# Patient Record
Sex: Male | Born: 1960 | Race: Black or African American | Hispanic: No | Marital: Single | Smoking: Former smoker
Health system: Southern US, Community
[De-identification: ages and names within clinical notes are randomized; demographics above are authoritative.]

## PROBLEM LIST (undated history)

## (undated) DIAGNOSIS — G4730 Sleep apnea, unspecified: Secondary | ICD-10-CM

## (undated) DIAGNOSIS — C649 Malignant neoplasm of unspecified kidney, except renal pelvis: Secondary | ICD-10-CM

## (undated) DIAGNOSIS — I639 Cerebral infarction, unspecified: Secondary | ICD-10-CM

## (undated) DIAGNOSIS — K922 Gastrointestinal hemorrhage, unspecified: Secondary | ICD-10-CM

## (undated) DIAGNOSIS — Z789 Other specified health status: Secondary | ICD-10-CM

## (undated) DIAGNOSIS — I358 Other nonrheumatic aortic valve disorders: Secondary | ICD-10-CM

## (undated) DIAGNOSIS — R51 Headache: Secondary | ICD-10-CM

## (undated) DIAGNOSIS — E215 Disorder of parathyroid gland, unspecified: Secondary | ICD-10-CM

## (undated) DIAGNOSIS — N2581 Secondary hyperparathyroidism of renal origin: Secondary | ICD-10-CM

## (undated) DIAGNOSIS — K219 Gastro-esophageal reflux disease without esophagitis: Secondary | ICD-10-CM

## (undated) DIAGNOSIS — K5792 Diverticulitis of intestine, part unspecified, without perforation or abscess without bleeding: Secondary | ICD-10-CM

## (undated) DIAGNOSIS — R569 Unspecified convulsions: Secondary | ICD-10-CM

## (undated) DIAGNOSIS — N186 End stage renal disease: Secondary | ICD-10-CM

## (undated) DIAGNOSIS — F32A Depression, unspecified: Secondary | ICD-10-CM

## (undated) DIAGNOSIS — Z992 Dependence on renal dialysis: Secondary | ICD-10-CM

## (undated) DIAGNOSIS — K250 Acute gastric ulcer with hemorrhage: Secondary | ICD-10-CM

## (undated) DIAGNOSIS — F419 Anxiety disorder, unspecified: Secondary | ICD-10-CM

## (undated) DIAGNOSIS — R7303 Prediabetes: Secondary | ICD-10-CM

## (undated) DIAGNOSIS — R519 Headache, unspecified: Secondary | ICD-10-CM

## (undated) DIAGNOSIS — D649 Anemia, unspecified: Secondary | ICD-10-CM

## (undated) DIAGNOSIS — I309 Acute pericarditis, unspecified: Secondary | ICD-10-CM

## (undated) DIAGNOSIS — I10 Essential (primary) hypertension: Secondary | ICD-10-CM

## (undated) DIAGNOSIS — M1990 Unspecified osteoarthritis, unspecified site: Secondary | ICD-10-CM

## (undated) DIAGNOSIS — H269 Unspecified cataract: Secondary | ICD-10-CM

## (undated) DIAGNOSIS — G8929 Other chronic pain: Secondary | ICD-10-CM

## (undated) HISTORY — DX: Anxiety disorder, unspecified: F41.9

## (undated) HISTORY — PX: OTHER SURGICAL HISTORY: SHX169

## (undated) HISTORY — DX: Gastro-esophageal reflux disease without esophagitis: K21.9

## (undated) HISTORY — DX: Depression, unspecified: F32.A

## (undated) HISTORY — DX: Anemia, unspecified: D64.9

## (undated) HISTORY — DX: Essential (primary) hypertension: I10

## (undated) HISTORY — DX: Headache, unspecified: R51.9

## (undated) HISTORY — PX: INSERTION OF DIALYSIS CATHETER: SHX1324

## (undated) HISTORY — DX: Unspecified cataract: H26.9

## (undated) HISTORY — DX: Headache: R51

## (undated) HISTORY — PX: NEPHRECTOMY: SHX65

## (undated) HISTORY — PX: AV FISTULA PLACEMENT: SHX1204

## (undated) HISTORY — DX: Cerebral infarction, unspecified: I63.9

## (undated) HISTORY — DX: Other chronic pain: G89.29

## (undated) HISTORY — DX: Acute pericarditis, unspecified: I30.9

## (undated) HISTORY — DX: Acute gastric ulcer with hemorrhage: K25.0

## (undated) HISTORY — DX: Sleep apnea, unspecified: G47.30

## (undated) HISTORY — PX: CATARACT EXTRACTION W/ INTRAOCULAR LENS  IMPLANT, BILATERAL: SHX1307

## (undated) HISTORY — PX: COLON SURGERY: SHX602

## (undated) SURGERY — EGD (ESOPHAGOGASTRODUODENOSCOPY)
Anesthesia: Moderate Sedation

---

## 2003-07-06 ENCOUNTER — Emergency Department (HOSPITAL_COMMUNITY): Admission: EM | Admit: 2003-07-06 | Discharge: 2003-07-07 | Payer: Self-pay | Admitting: Emergency Medicine

## 2006-06-27 ENCOUNTER — Ambulatory Visit: Payer: Self-pay | Admitting: Family Medicine

## 2006-07-04 ENCOUNTER — Ambulatory Visit: Payer: Self-pay | Admitting: Family Medicine

## 2006-07-11 ENCOUNTER — Encounter: Payer: Self-pay | Admitting: Internal Medicine

## 2006-08-02 ENCOUNTER — Inpatient Hospital Stay (HOSPITAL_COMMUNITY): Admission: EM | Admit: 2006-08-02 | Discharge: 2006-08-27 | Payer: Self-pay | Admitting: Emergency Medicine

## 2006-08-02 ENCOUNTER — Emergency Department: Payer: Self-pay | Admitting: Emergency Medicine

## 2006-08-02 DIAGNOSIS — I12 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 stage renal disease: Secondary | ICD-10-CM | POA: Insufficient documentation

## 2006-08-06 ENCOUNTER — Encounter (INDEPENDENT_AMBULATORY_CARE_PROVIDER_SITE_OTHER): Payer: Self-pay | Admitting: Emergency Medicine

## 2006-08-09 ENCOUNTER — Ambulatory Visit: Payer: Self-pay | Admitting: Vascular Surgery

## 2006-08-14 ENCOUNTER — Ambulatory Visit: Payer: Self-pay | Admitting: *Deleted

## 2006-08-14 ENCOUNTER — Encounter (INDEPENDENT_AMBULATORY_CARE_PROVIDER_SITE_OTHER): Payer: Self-pay | Admitting: Nephrology

## 2006-08-29 DIAGNOSIS — D689 Coagulation defect, unspecified: Secondary | ICD-10-CM | POA: Insufficient documentation

## 2006-08-29 DIAGNOSIS — E1151 Type 2 diabetes mellitus with diabetic peripheral angiopathy without gangrene: Secondary | ICD-10-CM | POA: Insufficient documentation

## 2006-09-30 ENCOUNTER — Ambulatory Visit: Payer: Self-pay | Admitting: Vascular Surgery

## 2006-09-30 ENCOUNTER — Ambulatory Visit (HOSPITAL_COMMUNITY): Admission: RE | Admit: 2006-09-30 | Discharge: 2006-09-30 | Payer: Self-pay | Admitting: Vascular Surgery

## 2006-11-06 ENCOUNTER — Ambulatory Visit: Payer: Self-pay | Admitting: Surgery

## 2006-11-06 ENCOUNTER — Ambulatory Visit (HOSPITAL_COMMUNITY): Admission: RE | Admit: 2006-11-06 | Discharge: 2006-11-06 | Payer: Self-pay | Admitting: Vascular Surgery

## 2006-11-07 ENCOUNTER — Emergency Department (HOSPITAL_COMMUNITY): Admission: EM | Admit: 2006-11-07 | Discharge: 2006-11-07 | Payer: Self-pay | Admitting: Emergency Medicine

## 2006-11-14 ENCOUNTER — Ambulatory Visit (HOSPITAL_COMMUNITY): Admission: RE | Admit: 2006-11-14 | Discharge: 2006-11-14 | Payer: Self-pay | Admitting: Vascular Surgery

## 2006-11-15 ENCOUNTER — Ambulatory Visit (HOSPITAL_COMMUNITY): Admission: RE | Admit: 2006-11-15 | Discharge: 2006-11-15 | Payer: Self-pay | Admitting: Vascular Surgery

## 2006-12-27 ENCOUNTER — Ambulatory Visit: Payer: Self-pay | Admitting: Surgery

## 2006-12-28 ENCOUNTER — Ambulatory Visit (HOSPITAL_COMMUNITY): Admission: RE | Admit: 2006-12-28 | Discharge: 2006-12-28 | Payer: Self-pay | Admitting: Surgery

## 2007-01-01 ENCOUNTER — Ambulatory Visit (HOSPITAL_COMMUNITY): Admission: EM | Admit: 2007-01-01 | Discharge: 2007-01-01 | Payer: Self-pay | Admitting: Emergency Medicine

## 2007-01-21 ENCOUNTER — Ambulatory Visit: Payer: Self-pay | Admitting: Vascular Surgery

## 2007-03-28 ENCOUNTER — Inpatient Hospital Stay (HOSPITAL_COMMUNITY): Admission: RE | Admit: 2007-03-28 | Discharge: 2007-03-29 | Payer: Self-pay | Admitting: Nephrology

## 2007-03-29 ENCOUNTER — Ambulatory Visit: Payer: Self-pay | Admitting: Vascular Surgery

## 2007-04-22 ENCOUNTER — Ambulatory Visit: Payer: Self-pay | Admitting: Vascular Surgery

## 2007-04-23 ENCOUNTER — Ambulatory Visit (HOSPITAL_COMMUNITY): Admission: RE | Admit: 2007-04-23 | Discharge: 2007-04-23 | Payer: Self-pay | Admitting: Vascular Surgery

## 2007-05-02 ENCOUNTER — Ambulatory Visit (HOSPITAL_COMMUNITY): Admission: RE | Admit: 2007-05-02 | Discharge: 2007-05-02 | Payer: Self-pay | Admitting: Vascular Surgery

## 2007-06-11 ENCOUNTER — Ambulatory Visit: Payer: Self-pay | Admitting: Vascular Surgery

## 2007-06-28 ENCOUNTER — Ambulatory Visit (HOSPITAL_COMMUNITY): Admission: RE | Admit: 2007-06-28 | Discharge: 2007-06-28 | Payer: Self-pay | Admitting: Interventional Radiology

## 2007-06-30 ENCOUNTER — Ambulatory Visit: Payer: Self-pay | Admitting: *Deleted

## 2007-06-30 ENCOUNTER — Ambulatory Visit (HOSPITAL_COMMUNITY): Admission: RE | Admit: 2007-06-30 | Discharge: 2007-06-30 | Payer: Self-pay | Admitting: Vascular Surgery

## 2007-08-06 ENCOUNTER — Ambulatory Visit: Payer: Self-pay | Admitting: Vascular Surgery

## 2007-12-03 ENCOUNTER — Ambulatory Visit: Payer: Self-pay | Admitting: Cardiology

## 2007-12-03 ENCOUNTER — Ambulatory Visit (HOSPITAL_COMMUNITY): Admission: RE | Admit: 2007-12-03 | Discharge: 2007-12-03 | Payer: Self-pay | Admitting: Nephrology

## 2007-12-03 ENCOUNTER — Encounter (INDEPENDENT_AMBULATORY_CARE_PROVIDER_SITE_OTHER): Payer: Self-pay | Admitting: Nephrology

## 2007-12-19 ENCOUNTER — Ambulatory Visit: Payer: Self-pay

## 2009-01-28 ENCOUNTER — Ambulatory Visit: Payer: Self-pay | Admitting: Vascular Surgery

## 2009-02-07 ENCOUNTER — Ambulatory Visit: Payer: Self-pay | Admitting: Vascular Surgery

## 2009-02-08 ENCOUNTER — Ambulatory Visit: Payer: Self-pay | Admitting: Vascular Surgery

## 2009-02-08 ENCOUNTER — Ambulatory Visit (HOSPITAL_COMMUNITY): Admission: RE | Admit: 2009-02-08 | Discharge: 2009-02-08 | Payer: Self-pay | Admitting: Vascular Surgery

## 2009-03-21 ENCOUNTER — Ambulatory Visit: Payer: Self-pay | Admitting: Surgery

## 2009-03-25 ENCOUNTER — Inpatient Hospital Stay (HOSPITAL_COMMUNITY): Admission: AD | Admit: 2009-03-25 | Discharge: 2009-03-27 | Payer: Self-pay | Admitting: Surgery

## 2009-03-25 ENCOUNTER — Ambulatory Visit: Payer: Self-pay | Admitting: Surgery

## 2009-03-28 ENCOUNTER — Emergency Department (HOSPITAL_COMMUNITY): Admission: EM | Admit: 2009-03-28 | Discharge: 2009-03-28 | Payer: Self-pay | Admitting: Family Medicine

## 2009-04-11 ENCOUNTER — Ambulatory Visit: Payer: Self-pay | Admitting: Surgery

## 2009-07-29 DIAGNOSIS — E440 Moderate protein-calorie malnutrition: Secondary | ICD-10-CM | POA: Insufficient documentation

## 2009-11-09 ENCOUNTER — Ambulatory Visit (HOSPITAL_COMMUNITY): Admission: RE | Admit: 2009-11-09 | Discharge: 2009-11-09 | Payer: Self-pay | Admitting: Nephrology

## 2009-12-05 ENCOUNTER — Ambulatory Visit: Payer: Self-pay | Admitting: Surgery

## 2010-05-03 LAB — BASIC METABOLIC PANEL
BUN: 57 mg/dL — ABNORMAL HIGH (ref 6–23)
CO2: 26 mEq/L (ref 19–32)
Calcium: 9.9 mg/dL (ref 8.4–10.5)
Chloride: 101 mEq/L (ref 96–112)
Creatinine, Ser: 9.81 mg/dL — ABNORMAL HIGH (ref 0.4–1.5)
GFR calc Af Amer: 7 mL/min — ABNORMAL LOW (ref >60)
GFR calc non Af Amer: 6 mL/min — ABNORMAL LOW (ref >60)
Glucose, Bld: 99 mg/dL (ref 70–99)
Potassium: 5.5 mEq/L — ABNORMAL HIGH (ref 3.5–5.1)
Sodium: 137 mEq/L (ref 135–145)

## 2010-05-03 LAB — POCT I-STAT 4, (NA,K, GLUC, HGB,HCT)
Glucose, Bld: 95 mg/dL (ref 70–99)
HCT: 38 % — ABNORMAL LOW (ref 39.0–52.0)
Hemoglobin: 12.9 g/dL — ABNORMAL LOW (ref 13.0–17.0)
Potassium: 4.3 mEq/L (ref 3.5–5.1)
Sodium: 136 mEq/L (ref 135–145)

## 2010-05-03 LAB — ANAEROBIC CULTURE

## 2010-05-03 LAB — CBC
HCT: 31.5 % — ABNORMAL LOW (ref 39.0–52.0)
Hemoglobin: 10.8 g/dL — ABNORMAL LOW (ref 13.0–17.0)
MCHC: 34.3 g/dL (ref 30.0–36.0)
MCV: 92.9 fL (ref 78.0–100.0)
Platelets: 203 10*3/uL (ref 150–400)
RBC: 3.39 MIL/uL — ABNORMAL LOW (ref 4.22–5.81)
RDW: 14.9 % (ref 11.5–15.5)
WBC: 8.1 10*3/uL (ref 4.0–10.5)

## 2010-05-03 LAB — WOUND CULTURE: Culture: NO GROWTH

## 2010-05-15 LAB — POCT I-STAT 4, (NA,K, GLUC, HGB,HCT)
Glucose, Bld: 83 mg/dL (ref 70–99)
HCT: 38 % — ABNORMAL LOW (ref 39.0–52.0)
Hemoglobin: 12.9 g/dL — ABNORMAL LOW (ref 13.0–17.0)
Potassium: 5.9 mEq/L — ABNORMAL HIGH (ref 3.5–5.1)
Sodium: 138 mEq/L (ref 135–145)

## 2010-05-15 LAB — WOUND CULTURE: Culture: NO GROWTH

## 2010-06-13 ENCOUNTER — Ambulatory Visit (HOSPITAL_COMMUNITY): Payer: Medicare Other | Attending: Nephrology | Admitting: Radiology

## 2010-06-13 VITALS — Ht 73.0 in | Wt 282.0 lb

## 2010-06-13 DIAGNOSIS — Z01810 Encounter for preprocedural cardiovascular examination: Secondary | ICD-10-CM

## 2010-06-13 DIAGNOSIS — R0989 Other specified symptoms and signs involving the circulatory and respiratory systems: Secondary | ICD-10-CM

## 2010-06-13 DIAGNOSIS — R0609 Other forms of dyspnea: Secondary | ICD-10-CM

## 2010-06-13 MED ORDER — TECHNETIUM TC 99M TETROFOSMIN IV KIT
33.0000 | PACK | Freq: Once | INTRAVENOUS | Status: AC | PRN
  Administered 2010-06-13: 33 via INTRAVENOUS

## 2010-06-13 NOTE — Progress Notes (Signed)
[facility] [facility] NUCLEAR MED [place] [address] [phone]  Cardiology Nuclear Med Study  [name] is a 50 y.o. male [id number] 1961/01/29   Nuclear Med Background Indication for Stress Test:  Evaluation for Ischemia, Surgical Clearance and Pending Kidney Transplant History:  Echo and Myocardial Perfusion Study Cardiac Risk Factors: Family History - CAD, History of Smoking and Hypertension  Symptoms:  Palpitations and Rapid HR   Nuclear Pre-Procedure Caffeine/Decaff Intake:  None NPO After: 2:00am   Lungs:  clear IV 0.9% NS with Angio Cath:  20g  IV Site: L Hand  IV Started by:  Perrin Maltese, EMT-P  Chest Size (in):  52 Cup Size: n/a  Height: 6\' 1"  (1.854 m)  Weight:  282 lb (127.914 kg)  BMI:  Body mass index is 37.21 kg/(m^2). Tech Comments:  n/a    Nuclear Med Study 1 or 2 day study: 2 day  Stress Test Type:  Stress  Reading MD: Glori Bickers, MD  Order Authorizing Provider:  C.Dunham  Resting Radionuclide: Technetium 37m Tetrofosmin  Resting Radionuclide Dose: 33 mCi   Stress Radionuclide:  Technetium 70m Tetrofosmin  Stress Radionuclide Dose: 33 mCi           Stress Protocol Rest HR: 103 Stress HR: 179  Rest BP: 127/62 Stress BP: 179/102  Exercise Time (min): 7:30 METS: 9.2   Predicted Max HR: 169 bpm % Max HR: 106.51 bpm Rate Pressure Product: 32041   Dose of Adenosine (mg):  n/a Dose of Lexiscan: n/a mg  Dose of Atropine (mg): n/a Dose of Dobutamine: n/a mcg/kg/min (at max HR)  Stress Test Technologist: Perrin Maltese, EMT-P  Nuclear Technologist:  Charlton Amor, CNMT     Rest Procedure:  Myocardial perfusion imaging was performed at rest 45 minutes following the intravenous administration of Technetium 61m Tetrofosmin. Rest ECG: NSR  Stress Procedure:  The patient exercised for 7:30.  The patient stopped due to fatigue and denied any chest pain.  There were no significant ST-T wave changes.   Technetium 63m Tetrofosmin was injected at peak exercise and myocardial perfusion imaging was performed after a brief delay. Stress ECG: Insignificant upsloping ST segment depression.  QPS Raw Data Images:  Normal; no motion artifact; normal heart/lung ratio. Stress Images:  There is decreased uptake in the inferior wall. Rest Images:  There is decreased uptake in the inferior wall. Subtraction (SDS):  There is a fixed inferior defect that is most consistent with diaphragmatic attenuation. Transient Ischemic Dilatation (Normal <1.22):  0.84 Lung/Heart Ratio (Normal <0.45):  0.32  Quantitative Gated Spect Images QGS EDV:  132 ml QGS ESV:  60 ml QGS cine images:  NL LV Function; NL Wall Motion QGS EF: 54%  Impression Exercise Capacity:  Fair exercise capacity. BP Response:  Normal blood pressure response. Clinical Symptoms:  No chest pain. ECG Impression:  Insignificant upsloping ST segment depression. Comparison with Prior Nuclear Study: No images to compare  Overall Impression:  Normal stress nuclear study. There is a mild fixed inferior defect consistent with diaphragmatic attenuation.     [name]

## 2010-06-19 ENCOUNTER — Ambulatory Visit (AMBULATORY_SURGERY_CENTER): Payer: Medicare Other | Admitting: *Deleted

## 2010-06-19 VITALS — Ht 74.0 in | Wt 298.7 lb

## 2010-06-19 DIAGNOSIS — Z1211 Encounter for screening for malignant neoplasm of colon: Secondary | ICD-10-CM

## 2010-06-19 MED ORDER — PEG-KCL-NACL-NASULF-NA ASC-C 100 G PO SOLR: ORAL | Status: DC

## 2010-06-20 ENCOUNTER — Encounter (HOSPITAL_COMMUNITY): Payer: Medicare Other | Admitting: Radiology

## 2010-06-22 ENCOUNTER — Other Ambulatory Visit: Payer: Self-pay | Admitting: Gastroenterology

## 2010-06-27 ENCOUNTER — Ambulatory Visit (HOSPITAL_COMMUNITY): Payer: Medicare Other | Attending: Nephrology | Admitting: Radiology

## 2010-06-27 DIAGNOSIS — R0989 Other specified symptoms and signs involving the circulatory and respiratory systems: Secondary | ICD-10-CM

## 2010-06-27 MED ORDER — TECHNETIUM TC 99M TETROFOSMIN IV KIT
33.0000 | PACK | Freq: Once | INTRAVENOUS | Status: AC | PRN
  Administered 2010-06-27: 33 via INTRAVENOUS

## 2010-06-27 NOTE — Assessment & Plan Note (Signed)
OFFICE VISIT   HAGEN, MAKAREWICZ  DOB:  04/03/1960                                       03/21/2009  CHART#:[id number]   REASON FOR VISIT:  Dialysis graft swelling.   HISTORY:  This is a 50 year old gentleman who was seen by Dr. Donnetta Hutching in  December and found to have swelling over his graft.  This is in his left  forearm.  Ultrasound showed significant perigraft fluid.  There was  concern over infection, so the patient was taken to the operating room  for graft removal.  This was done on 02/08/2009.  Vein patch angioplasty  was performed.  The patient comes back today with a several-day history  of swelling over the upper arm graft.  He has not had any fevers or  drainage or chills, but he is concerned that this is very similar to the  way the initial presented.   PHYSICAL EXAMINATION:  His heart rate is 98.  Blood pressure 139/97,  temperature is 98.4.  general:  He is well-appearing in no distress.  HEENT within normal limits.  Cardiovascular:  Regular rate and rhythm.  Extremities:  There are 2 fluid-like areas over his left upper arm  graft.  His forearm removal sites are well-healed.   The patient had an ultrasound today which showed significant perigraft  fluid in the left upper arm.   PLAN:  Infected left upper arm graft.  The patient will be scheduled for  removal of his left upper arm graft and patch angioplasty of the artery  if required.  This will be scheduled for Friday, February 11th.     Eldridge Abrahams, MD  Electronically Signed   VWB/MEDQ  D:  03/21/2009  T:  03/22/2009  Job:  [id number]

## 2010-06-27 NOTE — Op Note (Signed)
[name], [name]             ACCOUNT NO.:  [id number]   MEDICAL RECORD NO.:  [id number]          PATIENT TYPE:  INP   LOCATION:  6708                         FACILITY:  [facility]   PHYSICIAN:  Nelda Severe. Kellie Simmering, M.D.  DATE OF BIRTH:  11-Sep-1960   DATE OF PROCEDURE:  03/29/2007  DATE OF DISCHARGE:  03/29/2007                               OPERATIVE REPORT   PREOPERATIVE DIAGNOSIS:  End-stage renal disease.   POSTOPERATIVE DIAGNOSIS:  End-stage renal disease.   OPERATION:  1. Bilateral ultrasound localization of internal jugular veins.  2. Insertion Diatek catheter via right internal jugular vein (28 cm).   SURGEON:  Nelda Severe. Kellie Simmering, M.D.   FIRST ASSISTANT:  Nurse.   ANESTHESIA:  Local.   PROCEDURE:  The patient was taken to the operating room, placed in  supine position at which time, the upper chest and neck were exposed and  both internal jugular veins were imaged using B-mode ultrasound.  Both  noted to be widely patent and normal in appearance and photographs were  taken.  After prepping and draping in routine sterile manner, the right  internal jugular vein was entered using a supraclavicular approach after  infiltrating with 1% Xylocaine.  Right guidewire passed into the right  atrium under fluoroscopic guidance.  After dilating the tract  appropriately, a 28-cm Diatek catheter passed through peel-away sheath,  positioned in the right atrium, tunneled peripherally and secured with  nylon sutures.  The wound was closed with Vicryl in subcuticular  fashion.  Sterile dressing applied.  The patient taken to recovery in  satisfactory condition.      Nelda Severe Kellie Simmering, M.D.  Electronically Signed     JDL/MEDQ  D:  03/29/2007  T:  03/31/2007  Job:  [id number]

## 2010-06-27 NOTE — Op Note (Signed)
[name], [name]             ACCOUNT NO.:  [id number]   MEDICAL RECORD NO.:  [id number]          PATIENT TYPE:  INP   LOCATION:  2550                         FACILITY:  [facility]   PHYSICIAN:  Judeth Cornfield. Scot Dock, M.D.DATE OF BIRTH:  Jun 30, 1960   DATE OF PROCEDURE:  01/01/2007  DATE OF DISCHARGE:  01/01/2007                               OPERATIVE REPORT   PREOPERATIVE DIAGNOSIS:  Bleeding from left arm graft.   POSTOPERATIVE DIAGNOSIS:  Bleeding from left arm graft.   PROCEDURE:  1. Exploration of left arm graft for bleeding.  2. Evacuation of lymphocele.   SURGEON:  Judeth Cornfield. Scot Dock, M.D.   ASSISTANT:  Nurse.   ANESTHESIA:  Local with sedation.   TECHNIQUE:  The patient was taken to the operating room, sedated by  anesthesia.  The left upper extremity was prepped and draped in usual  sterile fashion.  This patient had reportedly been bleeding since the  surgery Saturday according to the nurses of the dialysis unit and he was  brought for exploration of his wound.  There was also some separation of  the wound.  The skin was anesthetized and incision was opened.  There  was really no active bleeding, just some old hematoma which was  evacuated and a lymphocele which had coagulated which was evacuated.  There was a slight amount seepage from the graft.  I therefore used some  Gelfoam and thrombin here to control this.  The wound was irrigated with  copious amounts of saline and all the hematoma was evacuated.  The wound  was then closed with deep layer of 3-0 Vicryl.  Skin closed with  interrupted 3-0 nylons.  Sterile dressing was applied.  The patient  tolerated the procedure well was transferred to recovery room in  satisfactory condition.  All needle and sponge counts were correct.      Judeth Cornfield. Scot Dock, M.D.  Electronically Signed     CSD/MEDQ  D:  01/01/2007  T:  01/02/2007  Job:  [id number]

## 2010-06-27 NOTE — Op Note (Signed)
[name], [name]             ACCOUNT NO.:  [id number]   MEDICAL RECORD NO.:  [id number]          PATIENT TYPE:  AMB   LOCATION:  SDS                          FACILITY:  [facility]   PHYSICIAN:  Judeth Cornfield. Scot Dock, M.D.DATE OF BIRTH:  Feb 19, 1960   DATE OF PROCEDURE:  09/30/2006  DATE OF DISCHARGE:                               OPERATIVE REPORT   PREOPERATIVE DIAGNOSIS:  Chronic renal failure.   POSTOPERATIVE DIAGNOSIS:  Chronic renal failure.   PROCEDURE:  Placement of new left upper arm AV graft.   SURGEON:  Judeth Cornfield. Scot Dock, M.D.   ASSISTANT:  Izetta Dakin,  R.N.F.A.   ANESTHESIA:  Is local with sedation.   TECHNIQUE:  The patient was taken to the operating room and sedated by  anesthesia.  The left upper extremity was prepped and draped in usual  sterile fashion.  After the skin was infiltrated with 1% lidocaine, a  longitudinal incision was made above the antecubital level where I  explored for a larger brachial vein.  The veins were quite small, and as  the patient had had problems with the forearm graft, I elected to place  a new upper arm graft.  A separate longitudinal incision was made  beneath the axilla after the skin was anesthetized.  The high axillary  vein was dissected free, and a 4- to 7-mm graft was then tunneled  between the two incisions.  The brachial artery had been controlled at  the distal incision.  The patient was heparinized.  The brachial artery  was clamped proximally and distally, and a longitudinal arteriotomy was  made.  A segment of the 4-mm end of the graft excised, the graft  spatulated and sewn end-to-side to the brachial artery using continuous  6-0 Prolene suture.  The graft was then pulled to the appropriate length  for anastomosis to the axillary vein.  The vein was ligated distally and  spatulated proximally.  Graft was cut to the appropriate length,  spatulated and sewn end-to-end to the vein using continuous 6-0 Prolene  suture.  At the completion, there was an excellent thrill in the graft  and a palpable radial pulse.  Hemostasis was obtained in the wounds.  The wounds were closed with a deep layer of 3-0 Vicryl, the skin closed  with 4-0 Vicryl.  Sterile dressing was applied.  The patient tolerated  the procedure well and was transferred to the recovery room in  satisfactory condition.  All needle and sponge counts were correct.      Judeth Cornfield. Scot Dock, M.D.  Electronically Signed     CSD/MEDQ  D:  09/30/2006  T:  09/30/2006  Job:  [id number]

## 2010-06-27 NOTE — Assessment & Plan Note (Signed)
OFFICE VISIT   [name], [name]  DOB:  1960/07/11                                       12/05/2009  CHART#:[id number]   REASON FOR VISIT:  Poorly functioning right arm fistula.   HISTORY:  The patient is a 50 year-old gentleman with end-stage renal  disease who dialyzes through a right radiocephalic fistula.  He has been  having decreased flow rates so he was sent for a fistulogram.  The  fistulogram reveals multiple collateral vessels in the forearm and  cephalic vein that drains in the brachial system of the elbow.  Patient  is not having complaints of pain or swelling in his arm.   PHYSICAL EXAMINATION:  Heart rate is 111, blood pressure 113/75,  temperature 97.4.  General;  Well-appearing, in no distress.  Respirations nonlabored.  There is a palpable thrill within his fistula.  The vein has dilated substantially in the forearm,   I have reviewed his fistulogram.  There are branches  and collateral  veins in the distal forearm.  However, I do not think they are  contributing to poor flow rates in his dialysis access.  Based on the  fistulogram I do not see any intervention that needs to be performed at  this time.  I think that we will need to continue with using this  fistula until it fails and at that point in time, we will consider new  access.     Eldridge Abrahams, MD  Electronically Signed   VWB/MEDQ  D:  12/05/2009  T:  12/06/2009  Job:  [id number]   cc:   Elzie Rings. Lorrene Reid, M.D.

## 2010-06-27 NOTE — Op Note (Signed)
[name], [name]             ACCOUNT NO.:  [id number]   MEDICAL RECORD NO.:  [id number]          PATIENT TYPE:  OUT   LOCATION:  MDC                          FACILITY:  [facility]   PHYSICIAN:  Theotis Burrow IV, MDDATE OF BIRTH:  22-Jan-1961   DATE OF PROCEDURE:  11/06/2006  DATE OF DISCHARGE:                               OPERATIVE REPORT   PROCEDURE:  Removal of Diatek catheter.   ANESTHESIA:  Local.   BLOOD LOSS:  Minimal.   INDICATIONS FOR PROCEDURE:  A 50 year old with end stage renal disease  now dialyzing through permanent access but needs his catheter removed.  Risks and benefits were discussed with the patient.  Informed consent  was signed.   PROCEDURE:  The catheter was removed  in the B side of short stay.  Betadine was used for prep.  Lidocaine 1% was used for anesthesia.  Using a combination of blunt and sharp dissection, the cuff of the  catheter which was at the edge of the skin was identified and mobilized.  The cuff was then removed from the subcutaneous tissue and the catheter  removed.  Pressure was held until it was hemostatic.  A sterile dressing  was applied.  The patient tolerated the procedure well.  There were no  immediate complications.      Eldridge Abrahams, MD  Electronically Signed     VWB/MEDQ  D:  11/06/2006  T:  11/06/2006  Job:  [id number]

## 2010-06-27 NOTE — Op Note (Signed)
[name], [name]             ACCOUNT NO.:  [id number]   MEDICAL RECORD NO.:  [id number]          PATIENT TYPE:  INP   LOCATION:  5502                         FACILITY:  [facility]   PHYSICIAN:  Judeth Cornfield. Scot Dock, M.D.DATE OF BIRTH:  October 25, 1960   DATE OF PROCEDURE:  08/20/2006  DATE OF DISCHARGE:                               OPERATIVE REPORT   PREOPERATIVE DIAGNOSIS:  Clotted left forearm AV graft.   POSTOPERATIVE DIAGNOSIS:  Clotted left forearm AV graft.   PROCEDURE:  Thrombectomy of left forearm AV graft with revision of graft  and the graft was revised into the brachial vein.   SURGEON:  Judeth Cornfield. Scot Dock, M.D.   ASSISTANT:  Luther Bradley, PA.   ANESTHESIA:  Local with sedation.   TECHNIQUE:  The patient was taken to the operating room sedated by  anesthesia.  The left upper extremity was prepped and draped in the  usual sterile fashion.  After the skin was infiltrated with 1%  lidocaine, the antecubital incision was opened and the arterial and  venous limbs of the graft dissected free.  The venous limb of the graft  was into the basilic vein which was somewhat small.  I ligated it  distally.  It did take a 5 mm dilator, but I elected as this had clotted  so quickly to jump to an adjacent brachial vein.  The vein was ligated.  The brachial vein was dissected free, ligated distally and spatulated  proximally and valve was sharply excised.  The venous limb of the graft  was slightly redundant and this was shortened and spatulated and after  graft thrombectomy was achieved and the arterial plug retrieved, the  graft was flushed with heparinized saline and clamped.  The patient had  been heparinized.  The revised end of the venous limb of the graft was  then sewn end-to-end to the brachial vein using continuous 6-0 Prolene  suture.  At the completion, there was an excellent thrill in the graft.  Hemostasis was obtained in the wound.  The wound was closed with a  deep  layer of 3-0 Vicryl, the skin closed with 4-0 Vicryl.  Sterile dressing  was applied.  The patient tolerated the procedure well and was  transferred to the recovery room in satisfactory condition.  All needle  and sponge counts were correct.      Judeth Cornfield. Scot Dock, M.D.  Electronically Signed     CSD/MEDQ  D:  08/20/2006  T:  08/20/2006  Job:  [id number]

## 2010-06-27 NOTE — Consult Note (Signed)
[name], ROPP NO.:  [id number]   MEDICAL RECORD NO.:  [name]          PATIENT TYPE:  INP   LOCATION:  2305                         FACILITY:  [facility]   PHYSICIAN:  Sherril Croon, M.D.   DATE OF BIRTH:  1960-04-14   DATE OF CONSULTATION:  08/02/2006  DATE OF DISCHARGE:                                 CONSULTATION   A renal consultation at 7 in the morning, referred from [facility] with a creatinine of 24, potassium 8.  He was seen in the  [facility] by an unspecified doctor about 3-4 months ago.  He had  some complaints of swelling in both the left and right legs.  He  presented there to the walk-in clinic.  He was treated with an  antibiotic, diuretic, an antihypertensive, and discharge lab work was  drawn.  He was told that he had borderline diabetes.  There was no  mention of renal insufficiency at that time to the patient.  He was then  referred to Dr. Lovie Macadamia  , telephone number [phone], approximately 2  weeks ago.  He did not receive any blood work, complained of further  pain and swelling in his left and right legs, and was referred to a  podiatrist.  He was treated with Vicodin and Diclofenac.  At that time  he developed a diffuse rash which was itchy, and he attributed this to  either the Vicodin or the diclofenac therapy.  He stopped these  medications.  Over the past 1 week though, he has been complaining of  weakness, foul taste in mouth, and poor appetite.  He has felt fatigued,  weak for several weeks and has been unable to work secondary to fatigue  for about 3 months.  He also complains of impotence and unable to  achieve erection over the past 2-3 months.  He does have a history of  marijuana use and quit 12 months ago.  He also quit tobacco 12 months  ago.  He has no alcohol use.  He does admit to a 60 pounds weight loss  in the same period of time of about 3-4 months and did use daily Aleve  for several weeks.   PAST  MEDICAL HISTORY:  1. Hypertension.  2. Borderline diabetes.   The only medicines he can recall are hydrochlorothiazide, Neurontin, and  amoxicillin.  However, he states that some of these medicines he has not  been taking on a regular basis.  He is unable to recall further  medications, as his family are not with him and he is unable to get in  touch with them to find out what medicines he has at home.   ALLERGIES:  Possible reaction to either diclofenac or Vicodin.   SOCIAL HISTORY:  Two children.  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   FAMILY HISTORY:  Mother died in her 23s of cancer of the uterus.  Father  died in his 68s of CVA.  He had one brother with a cerebral aneurysm and  one brother  with hypertension.  No end-stage renal disease.   REVIEW OF SYSTEMS:  Admits to a 44-month history of weakness and fatigue  with some night sweats.  No fevers, sweats or chills.  EYES:  No visual  loss blurred vision, double vision.  EARS/NOSE/MOUTH/THROAT:  No hearing  loss, sore throat, nose bleeds.  CARDIOVASCULAR:  No chest pain,  orthopnea, no cough, pain or dyspnea on exertion.  RESPIRATORY:  No  cough, wheeze, did have one episode of hemoptysis 3-4 months ago.  ABDOMEN:  He has admitted to weight loss, nausea, vomiting over the past  week, loss of appetite.  UROGENITAL:  Admits to nocturia 2-3 times a  night, poor flow, and urgency.  NEUROLOGIC:  Numb in the right leg and  sometimes in the left leg.  No CVA.  No seizures.  MUSCULOSKELETAL:  Pain in the right hand, unable to close hand on the right.  HEMATOLOGY/ONCOLOGY:  Denies any cancer, DVT, or pulmonary embolus.  SKIN:  Admits to itching and a papular rash which he uses cortisone  cream on over-the-counter.  ENDOCRINE:  Told that he has borderline  diabetes by physician in [facility].   PHYSICAL EXAMINATION:  GENERAL:  Alert, very comfortable, lying  comfortably in  bed in no distress.  VITAL SIGNS:  Blood pressure 180/100, pulse of 95, temperature 98.1.  HEAD/EYES:  Normocephalic atraumatic.  Pupils round, equal, reactive.  No icterus.  Mild pallor.  EARS/NOSE/MOUTH/THROAT:  Clear.  No lesions.  NECK:  Supple.  No thyromegaly, adenopathy, no JVD, no carotid bruits.  CARDIOVASCULAR:  Regular rate and rhythm with gallop rhythm.  No rubs,  no murmurs.  RESPIRATORY:  Lung fields were clear to auscultation without wheezes or  rales.  ABDOMEN:  Soft, nontender.  It was obese with positive bowel sounds.  No  organomegaly or splenomegaly. GENITAL/RECTAL:  Unremarkable.  EXTREMITIES:  Reveal a deformity in the left foot.  SKIN:  Revealed a papular   Dictation ended at this point.      Sherril Croon, M.D.  Electronically Signed     MWW/MEDQ  D:  08/02/2006  T:  08/02/2006  Job:  [id number]

## 2010-06-27 NOTE — Op Note (Signed)
[name], [name]             ACCOUNT NO.:  [id number]   MEDICAL RECORD NO.:  [name]          PATIENT TYPE:  AMB   LOCATION:  SDS                          FACILITY:  [facility]   PHYSICIAN:  Jessy Oto. Fields, MD  DATE OF BIRTH:  04/04/1960   DATE OF PROCEDURE:  01/16/2007  DATE OF DISCHARGE:  11/14/2006                               OPERATIVE REPORT   PROCEDURE:  Simple thrombectomy, left upper arm arteriovenous graft.   PREOPERATIVE DIAGNOSIS:  Thrombosed left upper arm arteriovenous graft.   POSTOPERATIVE DIAGNOSIS:  Thrombosed left upper arm arteriovenous graft.   ANESTHESIA:  Local with IV sedation.   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   ASSISTANT:  Remo Lipps, RNFA.   OPERATIVE DETAIL:  After obtaining informed consent, the patient was  taken to the operating room.  The patient was placed in supine position  on the operating table.  After adequate sedation, the patient's entire  left upper extremity was prepped and draped in usual sterile fashion.  Local anesthesia was infiltrated at a preexisting longitudinal scar in  the axilla.  Incision was opened and carried down through subcutaneous  tissues down to level of graft.  Graft was dissected free  circumferentially.  Venous anastomosis was identified and dissected free  circumferentially.  The patient was then given 5000 units of intravenous  heparin.  Transverse graftotomy was made just above the level of venous  anastomosis.  #3 and #4 Fogarty catheters used to thrombectomize the  venous limb of the graft.  There was some backbleeding from this but the  catheter did not pass easily into the central venous system, suggesting  possible central vein problem.  There was no obvious narrowing of the  vein at the anastomosis.  Next this was thoroughly flushed with  heparinized saline and controlled with a fine bulldog clamp.  The  proximal aspect of graft was then thrombectomized #4 Fogarty  catheter.  All thrombotic material was removed.  Arterialized plug was retrieved.  There was good arterial inflow.  Graftotomy site was then repaired with  a running 6-0 Prolene suture.  Just prior to completion of anastomosis,  this was fore bled, back bled and thoroughly flushed.  Anastomosis was  secured, clamps were released, there was palpable pulse in the graft  immediately.  Next hemostasis was obtained.  Subcutaneous tissues were  reapproximated using running 3-0 Vicryl suture.  Skin was closed with 4-  0 Vicryl subcuticular stitch.  The patient tolerated procedure well and  there were no complications.  Instrument, sponge and needle counts were  correct at the end of the case.  The patient was taken to recovery in  stable condition.      Jessy Oto. Fields, MD  Electronically Signed     CEF/MEDQ  D:  01/16/2007  T:  01/17/2007  Job:  [id number]

## 2010-06-27 NOTE — Discharge Summary (Signed)
NAMEERICK, AYE             ACCOUNT NO.:  [id number]   MEDICAL RECORD NO.:  [name]          PATIENT TYPE:  INP   LOCATION:  6708                         FACILITY:  [facility]   PHYSICIAN:  Sol Blazing, M.D.DATE OF BIRTH:  1960-06-22   DATE OF ADMISSION:  03/28/2007  DATE OF DISCHARGE:  03/29/2007                               DISCHARGE SUMMARY   ADMITTING DIAGNOSES:  1. Hyperkalemia.  2. 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  3. End-stage renal disease.  4. Hypertension.  5. Anemia.  6. Secondary hyperparathyroidism.   DISCHARGE DIAGNOSES:  1. Hyperkalemia, corrected with hemodialysis.  2. Status post insertion of right IJ Diatek catheter, March 29, 2007, Dr. Kellie Simmering.  3. Status post insertion of a right femoral double-lumen hemodialysis      catheter, Dr. Jonnie Finner, March 28, 2007, with subsequent removal      on March 29, 2007.  4. End-stage renal disease on chronic hemodialysis.  5. Hypertension.  6. Anemia of chronic disease.  7. Secondary hyperparathyroidism.   BRIEF HISTORY:  A 50 year old black male with end-stage renal disease on  chronic hemodialysis, Monday, Wednesday, and Friday at the [facility], presented with a clotted left upper arm graft,  four times since placement in August 2008.  Potassium found to be 6.4.  He underwent thrombolysis and venous anastomosis angioplasty on the day  of admission in Interventional Radiology.  He was then being dialyzed  and 30 minutes into the dialysis the left upper arm AV Gore-Tex graft  clotted again.  He was being admitted for hyperkalemia and establishment  of an access.   LABS ON ADMISSION:  Potassium 6.4, repeated at 6.1.   HOSPITAL COURSE:  Dr. Jonnie Finner placed a right femoral double-lumen  dialysis catheter and he underwent an emergency dialysis for  hyperkalemia.  He tolerated it well with a 3-liter ultrafiltration.  Following day, he had a right  internal jugular Diatek catheter placed by  Dr. Kellie Simmering.  The patient underwent a second dialysis on February 14,  which he tolerated well and a 1.4-liter ultrafiltration was done.  His  post weight  was 117.8 kg.  The lowest recorded blood pressure was  107/85.  He was discharged in improved condition.  He will have an  appointment made by the vascular surgeons for an elective placement of a  right upper arm AV Gore-Tex graft done as an outpatient.  Postop chest x-  rays showed dialysis catheter tips to be in the distal superior vena  cava with no evidence of pneumothorax.  After successful use of the  right IJ Diatek catheter, the right femoral double-lumen dialysis  catheter was removed, pressure was held for a 15 minutes, and he was  discharged in improved condition.   DISCHARGE MEDICATIONS:  1. PhosLo 667 mg 3 tablets three times a day with meals.  2. Amlodipine 10 mg nightly.  3. Metoprolol 50 mg nightly.  4. Multivitamin 1 daily.  5. Return to dialysis Monday, Wednesday, and Friday at the [facility].  Nonah Mattes, P.A.      Sol Blazing, M.D.  Electronically Signed    RRK/MEDQ  D:  06/01/2007  T:  06/02/2007  Job:  [id number]

## 2010-06-27 NOTE — Op Note (Signed)
NAMEALYSTER, BINGEN             ACCOUNT NO.:  [id number]   MEDICAL RECORD NO.:  [id number]          PATIENT TYPE:  SPE   LOCATION:  DFTL                         FACILITY:  [facility]   PHYSICIAN:  Nelda Severe. Kellie Simmering, M.D.  DATE OF BIRTH:  February 19, 1960   DATE OF PROCEDURE:  08/19/2006  DATE OF DISCHARGE:                               OPERATIVE REPORT   PREOPERATIVE DIAGNOSIS:  End-stage renal disease.   POSTOPERATIVE DIAGNOSIS:  End-stage renal disease.   OPERATION:  Insertion of left forearm brachial artery to basilic vein AV  Gore-Tex graft (4 mm - 7 mm stretch).   SURGEON:  Dr. Kellie Simmering   ASSISTANT:  Nurse.   ANESTHESIA:  Local.   PROCEDURE:  The patient was taken to the operating room, placed in the  supine position at which time the left upper extremity was prepped with  Betadine scrub solution and draped in routine sterile manner.  After  infiltration of 1% Xylocaine with epinephrine, transverse incision was  made in the antecubital area, antecubital vein dissected free.  The  cephalic vein was thrombosed as noted by the preoperative vein mapping.  Basilic vein was 5 mm vein and was satisfactory for grafting.  Brachial  artery was exposed beneath the fascia and it was an excellent vessel  with a strong pulse and no evidence of disease.  The loop shaped tunnel  was then created after infiltrating with 1% Xylocaine a 4 x 7 mm stretch  graft delivered through the tunnel using a small counterincision at the  apex the loop.  No heparin was given.  Brachial artery was occluded  proximally and distally with vessel loops, opened with 15 blade, and  extended with Potts scissors.  4 mm end of graft was spatulated and  anastomosed end-to-side with 6-0 Prolene.  Vein was then opened  longitudinally, after ligating it distally it would easily accept a 5-mm  dilator.  7 mm end of the graft was spatulated and anastomosed end-to-  side with 6-0 Prolene.  Clamps then released.  There was excellent  pulse  and palpable thrill in the graft.  No protamine was given.  No heparin  was given.  The wound was irrigated with saline, closed in layers with  Vicryl subcuticular fashion.  Sterile dressing applied.  The patient  taken to recovery in satisfactory condition.      Nelda Severe Kellie Simmering, M.D.  Electronically Signed     JDL/MEDQ  D:  08/19/2006  T:  08/20/2006  Job:  [id number]

## 2010-06-27 NOTE — Op Note (Signed)
[name], [name]             ACCOUNT NO.:  [id number]   MEDICAL RECORD NO.:  [id number]          PATIENT TYPE:  AMB   LOCATION:  SDS                          FACILITY:  [facility]   PHYSICIAN:  Dorothea Glassman, M.D.    DATE OF BIRTH:  30-Nov-1960   DATE OF PROCEDURE:  06/30/2007  DATE OF DISCHARGE:  06/30/2007                               OPERATIVE REPORT   SURGEON:  Dorothea Glassman, MD   ASSISTANT:  Chad Cordial, PA.   ANESTHESIA:  Local with MAC.   ANESTHESIOLOGIST:  Glynda Jaeger, MD   PREOPERATIVE DIAGNOSIS:  End-stage renal failure.   POSTOPERATIVE DIAGNOSIS:  End-stage renal failure.   PROCEDURE:  Right Cimino arteriovenous fistula.   OPERATIVE PROCEDURE:  The patient was brought to the operating room in  stable condition.  Placed in supine position.  Right arm prepped and  draped in sterile fashion.  Skin and subcutaneous tissues instilled 1%  Xylocaine with epinephrine.  Longitudinal skin incision made along the  right wrist at the anatomical snuff box.  Dissection carried down to  expose a 0000000 mm right cephalic vein.  Tributaries ligated with 4-0  silk and divided.  The vein mobilized, treated with papaverine, ligated  distally with a clip, and divided.  Dilated to 2.5 mm easily.  Controlled with serrefine clamp.  The radial artery was then exposed.  Tributaries were controlled with clips.  The patient administered 3000  units of heparin intravenously.  Brachial artery controlled proximally  and distally with serrefine clamps.  Longitudinal arteriotomy made.  The  cephalic vein anastomosed end-to-side to the radial artery using running  7-0 Prolene suture.  Clamps were then removed.  Excellent flow was  present.  Adequate hemostasis was obtained.  The sponge and instrument  counts were correct.   The patient tolerated the procedure well.  No apparent complications.  Transferred to recovery room in stable condition.      Dorothea Glassman, M.D.  Electronically  Signed     PGH/MEDQ  D:  06/30/2007  T:  07/01/2007  Job:  [id number]

## 2010-06-27 NOTE — Assessment & Plan Note (Signed)
OFFICE VISIT   [name], [name]  DOB:  1960/07/20                                       06/11/2007  CHART#:[id number]   The patient is referred today for consideration of placement of a new  hemodialysis access.  He has been on hemodialysis for approximately 1  year.  He has previously had a left forearm and left upper arm graft,  all of which have failed.  He currently does not take any blood thinners  including Coumadin or Plavix.  His medications include Nephro-Vite once  a day, Protonix 40 mg once a day, Norvasc 10 mg nightly, PhosLo 667 mg  with meals t.i.d., Tylenol p.r.n. for pain.  He currently is dialyzing  via a Diatek catheter on the right side.  He denies any shortness of  breath, chest pain, or abdominal pain.  He denies any recent stroke  symptoms.  He currently dialyzes on Tuesdays, Thursdays, and Saturdays.   PHYSICAL EXAM:  Blood pressure is 120/85 in the right arm.  Pulse is 83  and regular.  HEENT is unremarkable.  Chest is clear to auscultation.  Cardiac exam is regular rate and rhythm.  Abdomen is soft, nontender.  Right upper extremity has 2+ brachial and radial pulses.  He has a  visible cephalic vein near the forearm.  Vein mapping ultrasound today  showed the right cephalic vein is between 30 and 70 mm in diameter in  the forearm.  It becomes smaller into the upper arm and drains primarily  into the deep brachial system.   I believe the best option the patient would be placement of a right  radiocephalic AV fistula.  I have described to him the procedure  details, risks, benefits, and possible complications today including not  limited to bleeding, infection, and fistula non-maturation.  He  understands and agrees to proceed.  We have scheduled this for him on  Monday, May the 4th.   Jessy Oto. Fields, MD  Electronically Signed   CEF/MEDQ  D:  06/11/2007  T:  06/12/2007  Job:  [id number]   cc:   Elzie Rings. Lorrene Reid, M.D.

## 2010-06-27 NOTE — Procedures (Signed)
CEPHALIC VEIN MAPPING   INDICATION:  Evaluation prior to right arm dialysis access placement.   HISTORY:   EXAM:  Patient currently dialyzes via a subclavian catheter.   The right cephalic vein is compressible.   Diameter measurements range from 0.17 cm - 0.24 cm in diameter in the  upper arm and 0.42 - 0.71 cm in the forearm portion of the vessel.   The left cephalic vein is not evaluated.   See attached worksheet for all measurements.   IMPRESSION:  Patent right cephalic vein which is of acceptable diameter  for use as a dialysis access site in the forearm.  There are two  significant branches of the cephalic vein at the distal third of the  forearm.  The upper arm portion of the cephalic vein is borderline  acceptable for use as an arteriovenous fistula with the tourniquet  placed in the proximal arm.   ___________________________________________  Jessy Oto. Fields, MD   MC/MEDQ  D:  06/11/2007  T:  06/11/2007  Job:  [id number]

## 2010-06-27 NOTE — Assessment & Plan Note (Signed)
OFFICE VISIT   KILLION, HOMRICH  DOB:  May 02, 1960                                       02/07/2009  CHART#:[id number]   Patient presents today for continued follow-up of his left forearm loop  graft.  This graft had been placed 3 or 4 years ago and has  been  nonfunctional for quite some time.  He does have a right arm Cimino  fistula, which was placed in May 2009 and has been using this for  hemodialysis access.  He developed erythema in an area beside the left  arm forearm graft, which responded to antibiotic treatment.  This area  has continued to resolve, but now he is having increased swelling,  specifically over the graft itself.  He denies any severe pain  associated with this and also denies any fevers.  His review of systems  is otherwise unchanged.  He has no cardiac difficulties.  He is dialyzed  via the right arm fistula.   PHYSICAL EXAMINATION:  He does have no erythema over the forearm loop  graft.  He is afebrile.  He is in no acute distress.  Respirations are  16.  His blood pressure is 129/85.  Heart rate is 94.  He does have a 2+  left radial pulse and an excellent thrill in his right arm AV fistula.  He does have fullness and a fluctuant feeling around his left forearm  graft.  I imaged this with a duplex, and this does show fluids  surrounding the entire loop of the graft.   I discussed options with patient.  I explained this is unusual for him  to have this continued progression since the graft has not been used in  several years.  I am concerned regarding infection.  I have recommend  that we proceed with removal of his nonfunctional graft.  This will be  performed today as an outpatient at [facility] tomorrow,  02/08/09, at [facility].  He had successful dialysis today via  his right arm AV fistula.     Rosetta Posner, M.D.  Electronically Signed   TFE/MEDQ  D:  02/07/2009  T:  02/07/2009  Job:  [id number]   cc:    [facility]

## 2010-06-27 NOTE — Procedures (Signed)
VASCULAR LAB EXAM   INDICATION:  Left upper extremity AVG with fluid.   HISTORY:  Diabetes:  Yes  Cardiac:  No  Hypertension:  Yes   EXAM:  Left upper extremity AVG.   IMPRESSION:  1. Perigraft fluid is identified surrounding the upper arm AVG.  2. The AVG is occluded.  3. The native arteries are patent.   ___________________________________________  V. Leia Alf, MD   CJ/MEDQ  D:  03/21/2009  T:  03/22/2009  Job:  [id number]

## 2010-06-27 NOTE — Op Note (Signed)
NAMETOPRAK, MCCLEES             ACCOUNT NO.:  [id number]   MEDICAL RECORD NO.:  [id number]          PATIENT TYPE:  AMB   LOCATION:  DFTL                         FACILITY:  [facility]   PHYSICIAN:  Jessy Oto. Fields, MD  DATE OF BIRTH:  Jun 26, 1960   DATE OF PROCEDURE:  11/15/2006  DATE OF DISCHARGE:  11/15/2006                               OPERATIVE REPORT   PROCEDURE:  Left upper extremity Shunt-o-gram with angioplasty of left  axillary vein.   PREOPERATIVE DIAGNOSIS:  Central vein stenosis, left arm.   POSTOPERATIVE DIAGNOSIS:  Axillary vein stenosis, left arm.   OPERATIVE DETAIL:  After obtaining informed consent, the patient was  taken to the [facility] lab.  The patient was placed supine in the position on  the angioplasty table.  Local anesthesia was infiltrated over a left  upper arm AV graft, which had an easily palpable thrill in it.  The  patient had previously had a thrombectomy of this graft the day prior,  and suggestion of central vein stenosis on thrombectomy procedure.   Next, a micropuncture set was used to cannulate the left upper arm AV  graft.  A Shunt-o-gram was then performed in the left upper extremity.  This showed wide patency of the proximal graft.  There was was moderate  to severe narrowing of the vein in the axillary vein, approximately 80%  stenosis.  This was near the medial border of the scapula.  Next, the  micropuncture set was used to exchange and place a guidewire through  this (a 0.035 Wholey wire).  The micropuncture catheter was then removed  and the sheath up-sized for a 6-French sheath.   Repeat contrast angiogram was again performed, and this again shows a  high-grade stenosis of the axillary vein; presumably at or just above  the level of the anastomosis.  A 6 mm angioplasty balloon was then  brought up on the operative field.  Using contrast angiography the  precise site of stenosis was determined.  This was then inflated to 10  atmospheres for  1 minute.  Completion arteriogram was then performed.  This showed wide patency of the graft, with approximately 10% residual  stenosis.  Central venogram was also performed into the innominate and  superior vena cava system.  This was widely patent with no evidence of  stenosis.   Next, the sheath was removed after placing a silk pursestring stitch  around the exit site.  Hemostasis was then obtained with some direct  pressure, for approximately 5 minutes.  The patient tolerated the  procedure well and there were no complications.   OPERATIVE FINDINGS:  1. A 75% narrowing of axillary vein, with good response to 6 mm      angioplasty balloon.  2. No central venous stenosis.      Jessy Oto. Fields, MD  Electronically Signed    CEF/MEDQ  D:  11/20/2006  T:  11/21/2006  Job:  [id number]

## 2010-06-27 NOTE — Assessment & Plan Note (Signed)
OFFICE VISIT   [name], [name]  DOB:  1960/05/01                                       08/06/2007  CHART#:[id number]   The patient returns for followup after placement of a left radiocephalic  AV fistula on May 18 by Dr. Amedeo Plenty.   PHYSICAL EXAMINATION:  On exam today the fistula has an easily palpable  thrill and audible bruit.  It is maturing well.  He is currently  dialyzing via catheter.   He will return for followup in one month's time to reassess whether or  not the fistula is ready for cannulation.  He has no evidence of steal.  I instructed him today on exercising the fistula.   Jessy Oto. Fields, MD  Electronically Signed   CEF/MEDQ  D:  08/06/2007  T:  08/07/2007  Job:  [id number]

## 2010-06-27 NOTE — Op Note (Signed)
[name], [name]             ACCOUNT NO.:  [id number]   MEDICAL RECORD NO.:  [name]          PATIENT TYPE:  AMB   LOCATION:  SDS                          FACILITY:  [facility]   PHYSICIAN:  [name] IV, MDDATE OF BIRTH:  15-Jun-1960   DATE OF PROCEDURE:  12/28/2006  DATE OF DISCHARGE:                               OPERATIVE REPORT   PREOPERATIVE DIAGNOSIS:  Thrombosed left upper arm graft.   POSTOPERATIVE DIAGNOSIS:  Thrombosed left upper arm graft.   PROCEDURE PERFORMED:  Thrombectomy and revision of left upper arm graft.   TYPE OF ANESTHESIA:  MAC.   FINDINGS:  Neointimal hyperplasia at the venous anastomosis.  Adequate  central vein.   PROCEDURE:  The patient was identified in the holding area and taken to  room #6.  He was placed supine on the table.  MAC anesthesia was  administered.  Timeout was called and perioperative antibiotics were  administered.  The previous incision in the axilla was anesthetized with  1% lidocaine.  A #10-blade was used to open the previous incision, which  was extended slightly more proximal.  A Bovie cautery was used to  dissect the significant amount of scar tissue that was present.  The  graft was identified and isolated circumferentially, and a vessel loop  was placed.  Proximal dissection was then performed sharply until  adequate axillary vein was encountered.  The axillary vein was fully  mobilized.  At this point in time, the patient was systemically  heparinized.  A #15-blade was used to transect the graft.  A #4 followed  by #5 Fogarty catheter was used to perform thrombectomy of the venous  limb initially.  Once the venous had adequate back bleeding, an end-to-  end anastomosis was performed using a 6-mm Gore-Tex graft.  This was  done using a running 6-0 Prolene suture.  After completion of  anastomosis, the clamp that was occluding the axillary vein was removed.  There was good bleeding from the graft.  Next, the arterial  limb was  thrombectomized with a #5 Fogarty.  Catheter was passed multiple times  until all the thrombus was evacuated.  There was brisk pulsatile flow  from the arterial site of the graft.  The graft was then occluded and an  end-to-end anastomosis was created between the two limbs of the graft.  This was done with 6-0 Prolene.  Next, the graft was appropriately  flushed in a retrograde and anterograde fashion.  The anastomosis was  then secured.  There was a palpable thrill from the graft and good  Doppler signal within the axillary vein.  The patient also had a Doppler  signal in his radial artery.  Next, the wounds were copiously irrigated.  The deep tissues were reapproximated with 2 layers of interrupted 3-0  Vicryl.  The skin was closed with a running 4-0 Vicryl, and Dermabond  was placed.  The patient was taken to the recovery room in a stable  condition.      Carl Abrahams, MD  Electronically Signed     VWB/MEDQ  D:  12/28/2006  T:  12/28/2006  Job:  [id number]

## 2010-06-27 NOTE — Discharge Summary (Signed)
[name], [name]:  [id number]   MEDICAL RECORD NO.:  [name]          PATIENT TYPE:  INP   LOCATION:  5502                         FACILITY:  [place]   PHYSICIAN:  Sherril Croon, M.D.   DATE OF BIRTH:  07/10/1960   DATE OF ADMISSION:  08/02/2006  DATE OF DISCHARGE:  08/27/2006                               DISCHARGE SUMMARY   DISCHARGE DIAGNOSES:  1. Uremia secondary to new end-stage renal disease.  2. Hyperkalemia.  3. Hypertension.  4. Iron deficiency anemia.  5. Secondary hyperparathyroidism.  6. Clotted right arteriovenous (AV) Gore-Tex graft.  7. Jehovah's Witness.  8. Leukocytosis.  9. Hyperglycemia.   CONSULTATIONS:  Dr. Kellie Simmering.   PROCEDURES:  1. Placement of right femoral dialysis catheter on August 02, 2006, Dr.      Justin Mend.  2. Placement of right internal jugular tunnel dialysis catheter on      August 09, 2006, Dr. Scot Dock.  3. Placement of new left lower arteriovenous (AV) Gore-Tex graft on      August 20, 2006, Dr. Kellie Simmering.  4. Thrombectomy left lower arteriovenous (AV) Gore-Tex graft with      revision August 20, 2006, Dr. Scot Dock.  5. Ultrasound of kidneys showed normal size consistent with      nonspecific diffuse renal parenchymal disease, no hydronephrosis on      August 02, 2006.  6. Hemodialysis.   HISTORY OF PRESENT ILLNESS:  [name] [name] is a 50 year old,  African American gentleman who was referred to [facility] from  [facility] for treatment of his creatinine of 24  and potassium of 8.  He had been seen in the [facility] by an  unspecified doctor approximately 3-4 months prior and had some  complaints of swelling in both his left and right legs.  He was seen by  their walk-in clinic, treated with an antibiotic/diuretics  antihypertensive agent.  Discharge lab work was drawn.  He was told that  he had borderline diabetes, but there was no mention of renal  insufficiency at that time.  He  was then referred to Dr. Lovie Macadamia  approximately 2 weeks ago.  He has not received any lab work, complained  of further pain and swelling in his left and right legs and was referred  to a podiatrist.  He had been treated with Vicodin and diclofenac.  At  that time, he developed a diffuse rash which was itchy and he attributed  this to either the Vicodin for diclofenac therapy.  He stopped both  those medications over the last week, however, he has been complaining  of weakness, foul taste in his mouth and poor appetite.  He has felt  fatigued for several weeks and has been unable to work secondary to this  fatigue for the past 3 months.  He also complains of impotence and  inability to achieve erection over the past 2-3 months.  He does have a  history of marijuana use and quit 12 months ago.  He also quit tobacco  12 months ago.  He does not use alcohol.  He does  admit to a 60-pound  weight loss over the same period of time of about 3-4 months and did use  daily Aleve for several weeks.  Please refer to the admission history  and physical for further information regarding physical exam and  medications.   HOSPITAL COURSE:  Problem 1.  UREMIA SECONDARY TO NEW END-STAGE RENAL  DISEASE:  Due to the patient's hyperkalemia, he emergently had a femoral  catheter placed and was started on 0 potassium dialysis.  Labs drawn  predialysis showed a sodium of 135, potassium greater than 7.5, chloride  105, CO2 14, glucose 74, BUN 149, creatinine 25, calcium 8.7.  Total  protein was 8, albumin 2.1.  Liver function tests within normal limits.  Phosphorus was 10.5.  He received an initial dialysis treatment of 1  hour dialyzed later that day keeping his volume even.  Potassium was  rechecked and still elevated.  He was dialyzed for 3 hours on a 0  potassium bath the same day for a potassium of 7.1.  The following day,  his potassium was down to 6.1.  He was dialyzed on 0 potassium x2 hours  and then 2  potassium thereafter.  He was dialyzed on a 2 potassium bath  and hyperkalemia was gradually corrected.  Dialysis time was increased  as the length of treatment and blood flow rate were titrated up.  The  patient had the usual extensive workup and was found to have ultrasound  showing kidneys consistent with medical renal disease.  His ANA was 1:80  speckled.  He was ANCA positive at 1:80.  His ASO, anti-GBM and anti-  double-stranded DNA were all negative.  SPEP and UPEP were negative.  All hepatitis B and HIV serologies were negative.  C3 was somewhat  decreased to 67.  C4 was normal.  CH50 was also normal.  A biopsy was  unable to be done due to the fact his hemoglobin was low and he was a  Jehovah's Witness.  He was elected to empirically be placed on a 4-week  course of steroids to be completed on July 31.  At that time, it will be  re-evaluated whether or not to proceed with renal biopsy.  The patient  was initially anuric and had very little urine output throughout most of  his hospitalization.  When it became obvious that renal function was not  going to improve, the patient had a tunnel catheter and then graft  placed after vein mapping.  The graft reclotted the day following  placement and it was revised and functioned the remainder of his  hospitalization which should be ready to use approximately the second  week of August.  Due to the patient not initially living in [place],  financial issues and insurance issues, he was delayed from [place]  due to the [facility] office not being able to approve outpatient dialysis  arrangements.  He was eventually approved for the [facility] and appropriate dialysis orders were sent to that  facility.   Problem 2.  HYPERKALEMIA:  This was corrected as discussed in #1.   Problem 3.  HYPERTENSION:  The patient's blood pressure was controlled  with combination of medications and volume was essentially unchanged  during  his hospitalization.  His initial chest x-ray was negative and as  previously mentioned, he had lost a significant amount of weight prior  to presentation, although he still was significantly overweight.  Blood  pressure, at the time of discharge,  ranged between 120-140/80-90.  Medications can be titrated in the outpatient setting.   Problem 4.  IRON DEFICIENCY ANEMIA:  As previously mentioned, the  patient is a Jehovah's Witness.  His hemoglobin on presentation was 6.5.  His iron studies showed 14% saturation, total iron 23 and ferritin 734.  He was placed on high dose Aranesp as well as given IV iron.  He was  discharged on weekly dose of Venofer.   Problem 5.  SECONDARY HYPERPARATHYROIDISM:  Intact PTH was 237 which was  not significantly elevated given his high creatinine level.  Nonetheless, he was on Hectorol and his phosphorus became under control  with the use of PhosLo.   Problem 6.  CLOTTED GRAFT:  As mentioned in #1, his graft was revised on  July 8, and was functional at the time of discharge, although it will be  at least a month before he is ready for use.   Problem 7.  LEUKOCYTOSIS:  The patient had increase in white blood count  with the addition of Solu-Medrol and then this was changed to  prednisone.  White count on admission was 9.9 with 88% neutrophils, it  increased as high as 35,000 on June 27, however, was down to 14,000 at  the time of discharge.  Only on one episode did he have an elevated  temperature to 99.8.  He did not have any cultures drawn.   Problem 8.  HYPERGLYCEMIA: The patient was mentioned to have had  borderline diabetes in the history of present illness.  Once he was  started on IV Solu-Medrol, blood sugars increased and he was given  sliding scale insulin.  Once transitioned to prednisone, blood sugars  were much better and less than 120 at the time of discharge.  This does,  however, needs to be monitored and will be done so monthly with his   monthly labs.  If need be a hemoglobin A1c can be done.   CONDITION ON DISCHARGE:  Good.  The patient actually remained  hospitalized due to the outpatient dialysis center placement issues and  was out on pass several times during hospitalization.   DISPOSITION:  The patient was discharged to the home of his sister.   DISCHARGE MEDICATIONS:  1. Nephro-Vite one daily.  2. Protonix 40 mg daily.  3. Norvasc 10 mg at bedtime.  4. Prednisone 60 mg daily through July 31.  5. PhosLo 667 mg with meals three times day.  6. Tylenol for pain.   DIET:  Renal diet.  Limit fluids to 1200 mL per day.   WOUND CARE:  He is instructed not to take any showers while he has his  dialysis catheter.   FOLLOW UP:  His outpatient dialysis appointment is scheduled on Thursday  at 11:30 a.m. at [facility].   DISCHARGE DIALYSIS ORDERS:  Orders were faxed to the kidney center.  He  is to be on a 200, nonreuse kidney with a 2.5 calcium bath and a 2  potassium bath.  Estimated dry weight is 120.5, blood flow rate 400,  dialysate flow rate 800.  He is to be on tight heparin due to his anemia  and Jehovah's Witness status.  He is unbearable sodium 148 linear.  Hectorol dose is 2 mcg IV every dialysis.  Venofer 100 mg IV every  Thursday.  Epogen 28,000 units IV per hemodialysis.  His chart is to be  labeled no blood products.   Fifty minutes were spent completing the patient's  discharge paperwork,  dictating discharge summary and faxing discharge information to his  dialysis center.      Alric Seton, P.A.      Sherril Croon, M.D.  Electronically Signed    MB/MEDQ  D:  09/19/2006  T:  09/20/2006  Job:  [id number]   cc:   Sherril Croon, M.D.  Nelda Severe Kellie Simmering, M.D.

## 2010-06-27 NOTE — Assessment & Plan Note (Signed)
OFFICE VISIT   ABDON, MOELLER  DOB:  January 25, 1961                                       01/28/2009  CHART#:[id number]   CHIEF COMPLAINT:  Left arm swelling and redness x5 days in the area of  left forearm loop old AV graft.   HISTORY OF PRESENT ILLNESS:  The patient is a 50 year old male with end-  stage renal disease on hemodialysis Monday, Wednesday and Friday who  noted last weekend that he had some swelling and redness in the forearm  of the left upper extremity.  This was seen in dialysis and he was begun  on antibiotics.  The swelling and redness has decreased and is pretty  much gone.  It is nontender.  He still has an area of hardness along the  lateral aspect of the forearm away from the graft and he states he had  one episode of chills on Sunday but has not had any fever or chills  since that time.  The swelling was reduced.  The redness is gone.   PHYSICAL EXAMINATION:  Vital signs:  Temperature was 98.  Blood pressure  was 125/82.  General:  On physical exam this is a well-developed, well-  nourished gentleman in no acute distress.  HEENT:  Grossly within normal  limits.  Bilateral upper extremities were warm and pink.  He has a  functioning right Cimino fistula which was placed 06/30/2007 by Dr.  Amedeo Plenty with good thrill and bruit.  Left upper extremity forearm AV loop  graft was intact, nontender.  There is no fluctuance.  No redness or  erythema and it was nontender.  He did have an area of hardening over  the lateral aspect of the forearm way from the graft which may be a  thrombosed vein.  Again, there is no erythema and it was nontender.   LABS:  Blood cultures taken at dialysis are pending.   IMPRESSION:  Thrombophlebitis of vein in the left forearm.  The patient  had a left forearm loop AV graft which had been placed in 08/19/2006 by  Dr. Kellie Simmering which is nonfunctional without erythema, tenderness or  swelling.  The patient has been  treated with antibiotics on hemodialysis  with reduction of redness and swelling and pain in that area.  It does  not appear that there was an infected graft in the left forearm.   PLAN:  The patient will be seen at dialysis center and continued on  antibiotics for another week and he will follow up with [place] if he has  further issues, any increased swelling, redness, fevers, chills or pain  at the site, any fluctuance around the graft he will call and we will  see him again and rule out graft infection at that time.   Wray Kearns, PA-C   Rosetta Posner, M.D.  Electronically Signed   RR/MEDQ  D:  01/28/2009  T:  01/28/2009  Job:  [id number]

## 2010-06-27 NOTE — Assessment & Plan Note (Signed)
OFFICE VISIT   [name], Carl  DOB:  Feb 07, 1961                                       04/11/2009  CHART#:[id number]   The patient returns today for follow-up removal of  left upper arm graft  secondary to infection.  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  This was repaired primarily.  With this  brachial artery occluded, he had an ulnar signal.  Following repair of  his brachial artery, he had a palpable radial pulse.  He comes back in  today and is now off of his antibiotics.  All of his incisions have  healed.  There is a thickening in the left upper arm where his graft  used to be.  This is tubular in character.  The patient had a thick rind  of scar tissue surrounding his graft.  I reassured him that is what that  represented.  All of his graft has been removed.  He will followup with  me on a p.r.n.  basis.     Eldridge Abrahams, MD  Electronically Signed   VWB/MEDQ  D:  04/11/2009  T:  04/12/2009  Job:  [id number]

## 2010-06-27 NOTE — Op Note (Signed)
[name], [name]             ACCOUNT NO.:  [id number]   MEDICAL RECORD NO.:  [id number]          PATIENT TYPE:  INP   LOCATION:  5502                         FACILITY:  [facility]   PHYSICIAN:  Judeth Cornfield. Scot Dock, M.D.DATE OF BIRTH:  07/10/1960   DATE OF PROCEDURE:  08/09/2006  DATE OF DISCHARGE:                               OPERATIVE REPORT   PREOPERATIVE DIAGNOSIS:  Chronic renal failure.   POSTOPERATIVE DIAGNOSIS:  Chronic renal failure.   PROCEDURE:  Ultrasound-guided placement of a right IJ Diatek catheter.   SURGEON:  Dr. Scot Dock   ASSISTANT:  Nurse.   ANESTHESIA:  Local with sedation.   TECHNIQUE:  The patient was taken to the operating room and sedated by  anesthesia.  The neck and upper chest were prepped and draped in usual  sterile fashion.  Both internal jugular veins had been identified with  the ultrasound scanner and appeared to be patent.  The right IJ was  cannulated and guidewire introduced into the superior vena cava under  fluoroscopic control.  The tract over the wire was dilated and the  dilator and peel-away sheath were advanced over the wire and the wire  and dilator removed.  The catheter was passed through the peel-away  sheath and positioned in the right atrium.  I then tunneled the catheter  and upon fluoroscopy the catheter flipped up into the neck and I was  unable to salvage this.  Therefore I had to remove the catheter hold  pressure hemostasis and start again.  Again the right IJ was easily  cannulated. The guidewire introduced in the superior vena cava.  The  tract over the wire was dilated and then a dilator and peel-away sheath  were advanced over the wire and the wire and dilator removed.  The  catheter was passed through the peel-away sheath and positioned in the  right atrium.  The exit site for the catheter was selected and the skin  anesthetized between the two areas.  The catheter was then brought  through the tunnel, cut to the  appropriate length and the distal ports  were attached.  Both ports withdrew easily.  We then flushed with  heparinized saline and filled with concentrated heparin.  Catheter was  secured at its exit site with a 3-0 nylon suture.  The IJ cannulation  site was closed with 4-0 subcuticular stitch.  Sterile dressing was  applied.  The patient tolerated procedure well and was transferred to  recovery room in satisfactory condition.  All needle and sponge counts  were correct.      Judeth Cornfield. Scot Dock, M.D.  Electronically Signed     CSD/MEDQ  D:  08/09/2006  T:  08/10/2006  Job:  [id number]

## 2010-06-28 NOTE — Progress Notes (Signed)
Copy faxed to Dr. Alla German

## 2010-07-04 ENCOUNTER — Telehealth: Payer: Self-pay | Admitting: Internal Medicine

## 2010-07-04 NOTE — Telephone Encounter (Signed)
Stress,Echo faxed to [facility] @  [phone]  07/04/10/km

## 2010-07-18 ENCOUNTER — Encounter: Payer: Self-pay | Admitting: Gastroenterology

## 2010-07-18 ENCOUNTER — Ambulatory Visit (AMBULATORY_SURGERY_CENTER): Payer: Medicare Other | Admitting: Gastroenterology

## 2010-07-18 VITALS — BP 152/74 | HR 86 | Temp 98.5°F | Resp 16 | Ht 74.0 in | Wt 298.0 lb

## 2010-07-18 DIAGNOSIS — Z1211 Encounter for screening for malignant neoplasm of colon: Secondary | ICD-10-CM

## 2010-07-18 DIAGNOSIS — K5730 Diverticulosis of large intestine without perforation or abscess without bleeding: Secondary | ICD-10-CM

## 2010-07-18 MED ORDER — SODIUM CHLORIDE 0.9 % IV SOLN: 500.0000 mL | INTRAVENOUS | Status: DC

## 2010-07-18 NOTE — Patient Instructions (Signed)
Please refer to the neon green and blue sheets for instructions regarding diet and activity for the rest of today.  You may resume your medications as you would normally take them.

## 2010-07-19 ENCOUNTER — Telehealth: Payer: Self-pay | Admitting: *Deleted

## 2010-07-19 NOTE — Telephone Encounter (Signed)

## 2010-08-24 ENCOUNTER — Other Ambulatory Visit (HOSPITAL_COMMUNITY): Payer: Self-pay | Admitting: Nephrology

## 2010-08-24 ENCOUNTER — Other Ambulatory Visit (HOSPITAL_COMMUNITY): Payer: Medicare Other | Admitting: Radiology

## 2010-08-24 DIAGNOSIS — Z01818 Encounter for other preprocedural examination: Secondary | ICD-10-CM

## 2010-08-31 ENCOUNTER — Ambulatory Visit (HOSPITAL_COMMUNITY): Payer: Medicare Other | Attending: Nephrology | Admitting: Radiology

## 2010-08-31 DIAGNOSIS — Z01810 Encounter for preprocedural cardiovascular examination: Secondary | ICD-10-CM

## 2010-08-31 DIAGNOSIS — Z01818 Encounter for other preprocedural examination: Secondary | ICD-10-CM

## 2010-08-31 DIAGNOSIS — I079 Rheumatic tricuspid valve disease, unspecified: Secondary | ICD-10-CM | POA: Insufficient documentation

## 2010-08-31 DIAGNOSIS — I059 Rheumatic mitral valve disease, unspecified: Secondary | ICD-10-CM | POA: Insufficient documentation

## 2010-11-03 LAB — RENAL FUNCTION PANEL
Albumin: 3.3 — ABNORMAL LOW
BUN: 46 — ABNORMAL HIGH
CO2: 24
Calcium: 9.3
Chloride: 100
Creatinine, Ser: 9.86 — ABNORMAL HIGH
GFR calc Af Amer: 7 — ABNORMAL LOW
GFR calc non Af Amer: 6 — ABNORMAL LOW
Glucose, Bld: 107 — ABNORMAL HIGH
Phosphorus: 5.9 — ABNORMAL HIGH
Potassium: 5
Sodium: 134 — ABNORMAL LOW

## 2010-11-03 LAB — CBC
HCT: 38.4 — ABNORMAL LOW
Hemoglobin: 12.6 — ABNORMAL LOW
MCHC: 32.9
MCV: 87.7
Platelets: 164
RBC: 4.38
RDW: 16.7 — ABNORMAL HIGH
WBC: 6.5

## 2010-11-03 LAB — POTASSIUM
Potassium: 6.1 — ABNORMAL HIGH
Potassium: 6.4

## 2010-11-03 LAB — APTT: aPTT: 31

## 2010-11-03 LAB — PROTIME-INR
INR: 1
Prothrombin Time: 13.4

## 2010-11-06 LAB — POCT I-STAT 4, (NA,K, GLUC, HGB,HCT)
Glucose, Bld: 85
Glucose, Bld: 85
HCT: 38 — ABNORMAL LOW
HCT: 41
Hemoglobin: 12.9 — ABNORMAL LOW
Hemoglobin: 13.9
Operator id: 274481
Operator id: 181601
Potassium: 6.1 — ABNORMAL HIGH
Potassium: 6.4
Sodium: 139
Sodium: 142

## 2010-11-06 LAB — POTASSIUM
Potassium: 6.4
Potassium: 7

## 2010-11-08 LAB — POTASSIUM: Potassium: 6.5

## 2010-11-08 LAB — POCT I-STAT 4, (NA,K, GLUC, HGB,HCT)
Glucose, Bld: 89
HCT: 42
Hemoglobin: 14.3
Operator id: 181601
Potassium: 5.9 — ABNORMAL HIGH
Sodium: 137

## 2010-11-21 LAB — DIFFERENTIAL
Basophils Absolute: 0
Basophils Relative: 1
Eosinophils Absolute: 0 — ABNORMAL LOW
Eosinophils Relative: 0
Lymphocytes Relative: 7 — ABNORMAL LOW
Lymphs Abs: 0.8
Monocytes Absolute: 1.2 — ABNORMAL HIGH
Monocytes Relative: 11
Neutro Abs: 8.6 — ABNORMAL HIGH
Neutrophils Relative %: 81 — ABNORMAL HIGH

## 2010-11-21 LAB — POCT I-STAT 4, (NA,K, GLUC, HGB,HCT)
Glucose, Bld: 81
HCT: 47
Hemoglobin: 16
Operator id: 238621
Potassium: 5.2 — ABNORMAL HIGH
Sodium: 138

## 2010-11-21 LAB — I-STAT 8, (EC8 V) (CONVERTED LAB)
Acid-Base Excess: 8 — ABNORMAL HIGH
BUN: 24 — ABNORMAL HIGH
Bicarbonate: 30 — ABNORMAL HIGH
Chloride: 104
Glucose, Bld: 108 — ABNORMAL HIGH
HCT: 48
Hemoglobin: 16.3
Operator id: 288331
Potassium: 4.2
Sodium: 137
TCO2: 31
pCO2, Ven: 32.5 — ABNORMAL LOW
pH, Ven: 7.574 — ABNORMAL HIGH

## 2010-11-21 LAB — PROTIME-INR
INR: 1
Prothrombin Time: 13.6

## 2010-11-21 LAB — CBC
HCT: 41.2
Hemoglobin: 13.2
MCHC: 32
MCV: 86.8
Platelets: 212
RBC: 4.75
RDW: 18.4 — ABNORMAL HIGH
WBC: 10.6 — ABNORMAL HIGH

## 2010-11-21 LAB — SAMPLE TO BLOOD BANK

## 2010-11-21 LAB — POCT I-STAT CREATININE
Creatinine, Ser: 7.6 — ABNORMAL HIGH
Operator id: 288331

## 2010-11-21 LAB — APTT: aPTT: 31

## 2010-11-23 LAB — COMPREHENSIVE METABOLIC PANEL
ALT: <8
AST: 9
Albumin: 3 — ABNORMAL LOW
Alkaline Phosphatase: 57
BUN: 70 — ABNORMAL HIGH
CO2: 23
Calcium: 9.1
Chloride: 99
Creatinine, Ser: 11.51 — ABNORMAL HIGH
GFR calc Af Amer: 6 — ABNORMAL LOW
GFR calc non Af Amer: 5 — ABNORMAL LOW
Glucose, Bld: 90
Potassium: 5
Sodium: 137
Total Bilirubin: 0.3
Total Protein: 7.8

## 2010-11-23 LAB — POCT I-STAT 4, (NA,K, GLUC, HGB,HCT)
Glucose, Bld: 81
HCT: 55 — ABNORMAL HIGH
Hemoglobin: 18.7 — ABNORMAL HIGH
Operator id: 274481
Potassium: 5.7 — ABNORMAL HIGH
Sodium: 134 — ABNORMAL LOW

## 2010-11-23 LAB — CBC
HCT: 46.4
Hemoglobin: 14.7
MCHC: 31.7
MCV: 87.6
Platelets: 227
RBC: 5.3
RDW: 20 — ABNORMAL HIGH
WBC: 7.2

## 2010-11-23 LAB — APTT: aPTT: 32

## 2010-11-23 LAB — PROTIME-INR
INR: 1.3
Prothrombin Time: 16 — ABNORMAL HIGH

## 2010-11-24 LAB — POCT I-STAT 4, (NA,K, GLUC, HGB,HCT)
Glucose, Bld: 88
HCT: 44
Hemoglobin: 15
Operator id: 181601
Potassium: 4.1
Sodium: 132 — ABNORMAL LOW

## 2010-11-28 LAB — RENAL FUNCTION PANEL
Albumin: 2.3 — ABNORMAL LOW
Albumin: 2.3 — ABNORMAL LOW
Albumin: 2.5 — ABNORMAL LOW
Albumin: 2.5 — ABNORMAL LOW
Albumin: 2.6 — ABNORMAL LOW
Albumin: 2.6 — ABNORMAL LOW
BUN: 47 — ABNORMAL HIGH
BUN: 70 — ABNORMAL HIGH
BUN: 72 — ABNORMAL HIGH
BUN: 74 — ABNORMAL HIGH
BUN: 86 — ABNORMAL HIGH
BUN: 93 — ABNORMAL HIGH
CO2: 22
CO2: 23
CO2: 23
CO2: 23
CO2: 24
CO2: 27
Calcium: 8.2 — ABNORMAL LOW
Calcium: 8.3 — ABNORMAL LOW
Calcium: 8.5
Calcium: 8.6
Calcium: 8.8
Calcium: 8.8
Chloride: 94 — ABNORMAL LOW
Chloride: 96
Chloride: 96
Chloride: 96
Chloride: 97
Chloride: 98
Creatinine, Ser: 10.88 — ABNORMAL HIGH
Creatinine, Ser: 6.19 — ABNORMAL HIGH
Creatinine, Ser: 8.74 — ABNORMAL HIGH
Creatinine, Ser: 9.05 — ABNORMAL HIGH
Creatinine, Ser: 9.19 — ABNORMAL HIGH
Creatinine, Ser: 9.8 — ABNORMAL HIGH
GFR calc Af Amer: 12 — ABNORMAL LOW
GFR calc Af Amer: 6 — ABNORMAL LOW
GFR calc Af Amer: 7 — ABNORMAL LOW
GFR calc Af Amer: 7 — ABNORMAL LOW
GFR calc Af Amer: 8 — ABNORMAL LOW
GFR calc Af Amer: 8 — ABNORMAL LOW
GFR calc non Af Amer: 10 — ABNORMAL LOW
GFR calc non Af Amer: 5 — ABNORMAL LOW
GFR calc non Af Amer: 6 — ABNORMAL LOW
GFR calc non Af Amer: 6 — ABNORMAL LOW
GFR calc non Af Amer: 6 — ABNORMAL LOW
GFR calc non Af Amer: 7 — ABNORMAL LOW
Glucose, Bld: 113 — ABNORMAL HIGH
Glucose, Bld: 114 — ABNORMAL HIGH
Glucose, Bld: 78
Glucose, Bld: 83
Glucose, Bld: 85
Glucose, Bld: 92
Phosphorus: 5.1 — ABNORMAL HIGH
Phosphorus: 5.3 — ABNORMAL HIGH
Phosphorus: 6.1 — ABNORMAL HIGH
Phosphorus: 6.4 — ABNORMAL HIGH
Phosphorus: 6.4 — ABNORMAL HIGH
Phosphorus: 6.9 — ABNORMAL HIGH
Potassium: 4.3
Potassium: 4.4
Potassium: 4.6
Potassium: 4.7
Potassium: 5.2 — ABNORMAL HIGH
Potassium: 5.4 — ABNORMAL HIGH
Sodium: 132 — ABNORMAL LOW
Sodium: 132 — ABNORMAL LOW
Sodium: 133 — ABNORMAL LOW
Sodium: 134 — ABNORMAL LOW
Sodium: 135
Sodium: 136

## 2010-11-28 LAB — CBC
HCT: 22.4 — ABNORMAL LOW
HCT: 23 — ABNORMAL LOW
HCT: 24.8 — ABNORMAL LOW
HCT: 26.6 — ABNORMAL LOW
HCT: 26.8 — ABNORMAL LOW
HCT: 27.4 — ABNORMAL LOW
HCT: 27.6 — ABNORMAL LOW
HCT: 28.6 — ABNORMAL LOW
Hemoglobin: 7.2 — CL
Hemoglobin: 7.4 — CL
Hemoglobin: 7.9 — CL
Hemoglobin: 8.5 — ABNORMAL LOW
Hemoglobin: 8.6 — ABNORMAL LOW
Hemoglobin: 8.8 — ABNORMAL LOW
Hemoglobin: 8.8 — ABNORMAL LOW
Hemoglobin: 9.2 — ABNORMAL LOW
MCHC: 31.8
MCHC: 31.9
MCHC: 32
MCHC: 32
MCHC: 32.1
MCHC: 32.2
MCHC: 32.2
MCHC: 32.2
MCV: 83.2
MCV: 83.3
MCV: 85.2
MCV: 85.5
MCV: 85.8
MCV: 86.1
MCV: 86.3
MCV: 87.7
Platelets: 189
Platelets: 199
Platelets: 256
Platelets: 260
Platelets: 268
Platelets: 274
Platelets: 275
Platelets: 286
RBC: 2.69 — ABNORMAL LOW
RBC: 2.76 — ABNORMAL LOW
RBC: 2.9 — ABNORMAL LOW
RBC: 3.08 — ABNORMAL LOW
RBC: 3.13 — ABNORMAL LOW
RBC: 3.14 — ABNORMAL LOW
RBC: 3.18 — ABNORMAL LOW
RBC: 3.36 — ABNORMAL LOW
RDW: 26.8 — ABNORMAL HIGH
RDW: 26.8 — ABNORMAL HIGH
RDW: 26.8 — ABNORMAL HIGH
RDW: 27.4 — ABNORMAL HIGH
RDW: 27.6 — ABNORMAL HIGH
RDW: 27.7 — ABNORMAL HIGH
RDW: 27.7 — ABNORMAL HIGH
RDW: 28.1 — ABNORMAL HIGH
WBC: 11.8 — ABNORMAL HIGH
WBC: 13.2 — ABNORMAL HIGH
WBC: 14.2 — ABNORMAL HIGH
WBC: 15.6 — ABNORMAL HIGH
WBC: 15.9 — ABNORMAL HIGH
WBC: 16.3 — ABNORMAL HIGH
WBC: 16.5 — ABNORMAL HIGH
WBC: 19.6 — ABNORMAL HIGH

## 2010-11-28 LAB — COMPREHENSIVE METABOLIC PANEL
ALT: <8
AST: 12
Albumin: 2.7 — ABNORMAL LOW
Alkaline Phosphatase: 56
BUN: 96 — ABNORMAL HIGH
CO2: 22
Calcium: 8.8
Chloride: 97
Creatinine, Ser: 10.98 — ABNORMAL HIGH
GFR calc Af Amer: 6 — ABNORMAL LOW
GFR calc non Af Amer: 5 — ABNORMAL LOW
Glucose, Bld: 88
Potassium: 4.6
Sodium: 136
Total Bilirubin: 0.7
Total Protein: 7

## 2010-11-28 LAB — BASIC METABOLIC PANEL
BUN: 26 — ABNORMAL HIGH
BUN: 50 — ABNORMAL HIGH
CO2: 21
CO2: 29
Calcium: 8.8
Calcium: 9
Chloride: 97
Chloride: 99
Creatinine, Ser: 4.02 — ABNORMAL HIGH
Creatinine, Ser: 8.15 — ABNORMAL HIGH
GFR calc Af Amer: 19 — ABNORMAL LOW
GFR calc Af Amer: 9 — ABNORMAL LOW
GFR calc non Af Amer: 16 — ABNORMAL LOW
GFR calc non Af Amer: 7 — ABNORMAL LOW
Glucose, Bld: 102 — ABNORMAL HIGH
Glucose, Bld: 91
Potassium: 3.2 — ABNORMAL LOW
Potassium: 5.5 — ABNORMAL HIGH
Sodium: 136
Sodium: 137

## 2010-11-28 LAB — HEPATIC FUNCTION PANEL
ALT: 12
AST: 11
Albumin: 2.6 — ABNORMAL LOW
Alkaline Phosphatase: 51
Bilirubin, Direct: 0.1
Indirect Bilirubin: 0.4
Total Bilirubin: 0.5
Total Protein: 7.4

## 2010-11-28 LAB — PROTIME-INR
INR: 1
Prothrombin Time: 13.8

## 2010-11-28 LAB — APTT: aPTT: 25

## 2010-11-28 LAB — PHOSPHORUS: Phosphorus: 5.2 — ABNORMAL HIGH

## 2010-11-29 LAB — CBC
HCT: 18.2 — ABNORMAL LOW
HCT: 18.4 — ABNORMAL LOW
HCT: 18.5 — ABNORMAL LOW
HCT: 19.5 — ABNORMAL LOW
HCT: 19.7 — ABNORMAL LOW
HCT: 20.2 — ABNORMAL LOW
HCT: 21.3 — ABNORMAL LOW
HCT: 21.5 — ABNORMAL LOW
HCT: 22.5 — ABNORMAL LOW
Hemoglobin: 5.9 — CL
Hemoglobin: 6 — CL
Hemoglobin: 6.2 — CL
Hemoglobin: 6.5 — CL
Hemoglobin: 6.5 — CL
Hemoglobin: 6.7 — CL
Hemoglobin: 7 — CL
Hemoglobin: 7 — CL
Hemoglobin: 7.2 — CL
MCHC: 32.2
MCHC: 32.4
MCHC: 32.5
MCHC: 32.6
MCHC: 33
MCHC: 33
MCHC: 33.1
MCHC: 33.3
MCHC: 33.4
MCV: 76.6 — ABNORMAL LOW
MCV: 77.1 — ABNORMAL LOW
MCV: 77.3 — ABNORMAL LOW
MCV: 77.3 — ABNORMAL LOW
MCV: 77.4 — ABNORMAL LOW
MCV: 78.6
MCV: 78.7
MCV: 80.3
MCV: 81.1
Platelets: 395
Platelets: 412 — ABNORMAL HIGH
Platelets: 432 — ABNORMAL HIGH
Platelets: 447 — ABNORMAL HIGH
Platelets: 469 — ABNORMAL HIGH
Platelets: 469 — ABNORMAL HIGH
Platelets: 476 — ABNORMAL HIGH
Platelets: 482 — ABNORMAL HIGH
Platelets: 495 — ABNORMAL HIGH
RBC: 2.34 — ABNORMAL LOW
RBC: 2.35 — ABNORMAL LOW
RBC: 2.36 — ABNORMAL LOW
RBC: 2.54 — ABNORMAL LOW
RBC: 2.57 — ABNORMAL LOW
RBC: 2.61 — ABNORMAL LOW
RBC: 2.68 — ABNORMAL LOW
RBC: 2.76 — ABNORMAL LOW
RBC: 2.77 — ABNORMAL LOW
RDW: 18.3 — ABNORMAL HIGH
RDW: 18.5 — ABNORMAL HIGH
RDW: 18.6 — ABNORMAL HIGH
RDW: 18.8 — ABNORMAL HIGH
RDW: 18.8 — ABNORMAL HIGH
RDW: 19 — ABNORMAL HIGH
RDW: 19 — ABNORMAL HIGH
RDW: 19.3 — ABNORMAL HIGH
RDW: 20.6 — ABNORMAL HIGH
WBC: 12 — ABNORMAL HIGH
WBC: 20.6 — ABNORMAL HIGH
WBC: 26 — ABNORMAL HIGH
WBC: 30 — ABNORMAL HIGH
WBC: 35.9 — ABNORMAL HIGH
WBC: 7.5
WBC: 9
WBC: 9.9
WBC: 9.9

## 2010-11-29 LAB — RENAL FUNCTION PANEL
Albumin: 1.9 — ABNORMAL LOW
Albumin: 1.9 — ABNORMAL LOW
Albumin: 2 — ABNORMAL LOW
Albumin: 2.1 — ABNORMAL LOW
Albumin: 2.1 — ABNORMAL LOW
Albumin: 2.3 — ABNORMAL LOW
Albumin: 2.3 — ABNORMAL LOW
BUN: 100 — ABNORMAL HIGH
BUN: 111 — ABNORMAL HIGH
BUN: 148 — ABNORMAL HIGH
BUN: 68 — ABNORMAL HIGH
BUN: 72 — ABNORMAL HIGH
BUN: 85 — ABNORMAL HIGH
BUN: 99 — ABNORMAL HIGH
CO2: 15 — ABNORMAL LOW
CO2: 20
CO2: 21
CO2: 21
CO2: 22
CO2: 22
CO2: 23
Calcium: 8 — ABNORMAL LOW
Calcium: 8 — ABNORMAL LOW
Calcium: 8 — ABNORMAL LOW
Calcium: 8.2 — ABNORMAL LOW
Calcium: 8.4
Calcium: 8.4
Calcium: 8.6
Chloride: 101
Chloride: 102
Chloride: 92 — ABNORMAL LOW
Chloride: 92 — ABNORMAL LOW
Chloride: 95 — ABNORMAL LOW
Chloride: 96
Chloride: 97
Creatinine, Ser: 10.69 — ABNORMAL HIGH
Creatinine, Ser: 11.53 — ABNORMAL HIGH
Creatinine, Ser: 13.32 — ABNORMAL HIGH
Creatinine, Ser: 14.17 — ABNORMAL HIGH
Creatinine, Ser: 14.89 — ABNORMAL HIGH
Creatinine, Ser: 16.19 — ABNORMAL HIGH
Creatinine, Ser: 22.74 — ABNORMAL HIGH
GFR calc Af Amer: 3 — ABNORMAL LOW
GFR calc Af Amer: 4 — ABNORMAL LOW
GFR calc Af Amer: 4 — ABNORMAL LOW
GFR calc Af Amer: 5 — ABNORMAL LOW
GFR calc Af Amer: 5 — ABNORMAL LOW
GFR calc Af Amer: 6 — ABNORMAL LOW
GFR calc Af Amer: 6 — ABNORMAL LOW
GFR calc non Af Amer: 2 — ABNORMAL LOW
GFR calc non Af Amer: 3 — ABNORMAL LOW
GFR calc non Af Amer: 4 — ABNORMAL LOW
GFR calc non Af Amer: 4 — ABNORMAL LOW
GFR calc non Af Amer: 4 — ABNORMAL LOW
GFR calc non Af Amer: 5 — ABNORMAL LOW
GFR calc non Af Amer: 5 — ABNORMAL LOW
Glucose, Bld: 124 — ABNORMAL HIGH
Glucose, Bld: 130 — ABNORMAL HIGH
Glucose, Bld: 141 — ABNORMAL HIGH
Glucose, Bld: 173 — ABNORMAL HIGH
Glucose, Bld: 89
Glucose, Bld: 91
Glucose, Bld: 96
Phosphorus: 6.1 — ABNORMAL HIGH
Phosphorus: 6.6 — ABNORMAL HIGH
Phosphorus: 7.2 — ABNORMAL HIGH
Phosphorus: 7.3 — ABNORMAL HIGH
Phosphorus: 7.5 — ABNORMAL HIGH
Phosphorus: 7.5 — ABNORMAL HIGH
Phosphorus: 9.3 — ABNORMAL HIGH
Potassium: 4.3
Potassium: 4.7
Potassium: 5
Potassium: 5
Potassium: 5.1
Potassium: 5.5 — ABNORMAL HIGH
Potassium: 7.5
Sodium: 127 — ABNORMAL LOW
Sodium: 129 — ABNORMAL LOW
Sodium: 130 — ABNORMAL LOW
Sodium: 130 — ABNORMAL LOW
Sodium: 131 — ABNORMAL LOW
Sodium: 131 — ABNORMAL LOW
Sodium: 136

## 2010-11-29 LAB — DIFFERENTIAL
Basophils Absolute: 0
Basophils Relative: 0
Eosinophils Absolute: 0
Eosinophils Relative: 0
Lymphocytes Relative: 7 — ABNORMAL LOW
Lymphs Abs: 2
Monocytes Absolute: 1.7 — ABNORMAL HIGH
Monocytes Relative: 6
Neutro Abs: 26.3 — ABNORMAL HIGH
Neutrophils Relative %: 88 — ABNORMAL HIGH

## 2010-11-29 LAB — COMPREHENSIVE METABOLIC PANEL
ALT: 15
ALT: 17
ALT: 17
ALT: 19
AST: 13
AST: 15
AST: 18
AST: 18
Albumin: 2 — ABNORMAL LOW
Albumin: 2 — ABNORMAL LOW
Albumin: 2.1 — ABNORMAL LOW
Albumin: 2.3 — ABNORMAL LOW
Alkaline Phosphatase: 42
Alkaline Phosphatase: 45
Alkaline Phosphatase: 45
Alkaline Phosphatase: 51
BUN: 103 — ABNORMAL HIGH
BUN: 146 — ABNORMAL HIGH
BUN: 149 — ABNORMAL HIGH
BUN: 71 — ABNORMAL HIGH
CO2: 14 — ABNORMAL LOW
CO2: 16 — ABNORMAL LOW
CO2: 23
CO2: 25
Calcium: 8 — ABNORMAL LOW
Calcium: 8.3 — ABNORMAL LOW
Calcium: 8.4
Calcium: 8.7
Chloride: 103
Chloride: 103
Chloride: 105
Chloride: 98
Creatinine, Ser: 18.09 — ABNORMAL HIGH
Creatinine, Ser: 22.52 — ABNORMAL HIGH
Creatinine, Ser: 25.17 — ABNORMAL HIGH
Creatinine, Ser: 8.44 — ABNORMAL HIGH
GFR calc Af Amer: 2 — ABNORMAL LOW
GFR calc Af Amer: 3 — ABNORMAL LOW
GFR calc Af Amer: 3 — ABNORMAL LOW
GFR calc Af Amer: 8 — ABNORMAL LOW
GFR calc non Af Amer: 2 — ABNORMAL LOW
GFR calc non Af Amer: 2 — ABNORMAL LOW
GFR calc non Af Amer: 3 — ABNORMAL LOW
GFR calc non Af Amer: 7 — ABNORMAL LOW
Glucose, Bld: 109 — ABNORMAL HIGH
Glucose, Bld: 109 — ABNORMAL HIGH
Glucose, Bld: 74
Glucose, Bld: 99
Potassium: 4.4
Potassium: 6 — ABNORMAL HIGH
Potassium: 6.7
Potassium: >7.5
Sodium: 132 — ABNORMAL LOW
Sodium: 133 — ABNORMAL LOW
Sodium: 135
Sodium: 135
Total Bilirubin: 0.7
Total Bilirubin: 0.7
Total Bilirubin: 0.7
Total Bilirubin: 0.8
Total Protein: 7.8
Total Protein: 8
Total Protein: 8.2
Total Protein: 8.4 — ABNORMAL HIGH

## 2010-11-29 LAB — URINALYSIS, ROUTINE W REFLEX MICROSCOPIC
Bilirubin Urine: NEGATIVE
Bilirubin Urine: NEGATIVE
Bilirubin Urine: NEGATIVE
Bilirubin Urine: NEGATIVE
Glucose, UA: 100 — AB
Glucose, UA: NEGATIVE
Glucose, UA: NEGATIVE
Glucose, UA: NEGATIVE
Ketones, ur: NEGATIVE
Ketones, ur: NEGATIVE
Ketones, ur: NEGATIVE
Ketones, ur: NEGATIVE
Nitrite: NEGATIVE
Nitrite: NEGATIVE
Nitrite: NEGATIVE
Nitrite: NEGATIVE
Protein, ur: 100 — AB
Protein, ur: 100 — AB
Protein, ur: >300
Protein, ur: >300 — AB
Specific Gravity, Urine: 1.012
Specific Gravity, Urine: 1.012
Specific Gravity, Urine: 1.013 (ref 1.005–1.035)
Specific Gravity, Urine: 1.015
Urobilinogen, UA: 0.2
Urobilinogen, UA: 0.2
Urobilinogen, UA: 0.2
Urobilinogen, UA: 0.2
pH: 7.5
pH: 8
pH: 8
pH: 8

## 2010-11-29 LAB — PROTEIN ELECTROPH W RFLX QUANT IMMUNOGLOBULINS
Albumin ELP: 30.9 — ABNORMAL LOW
Alpha-1-Globulin: 7.6 — ABNORMAL HIGH
Alpha-2-Globulin: 14.7 — ABNORMAL HIGH
Beta 2: 6.6 — ABNORMAL HIGH
Beta Globulin: 4.2 — ABNORMAL LOW
Gamma Globulin: 36 — ABNORMAL HIGH
M-Spike, %: NOT DETECTED
Total Protein ELP: 8.6 — ABNORMAL HIGH

## 2010-11-29 LAB — I-STAT EC8
Acid-base deficit: 10 — ABNORMAL HIGH
BUN: >140 — ABNORMAL HIGH
Bicarbonate: 16 — ABNORMAL LOW
Chloride: 106
Glucose, Bld: 105 — ABNORMAL HIGH
HCT: 20 — ABNORMAL LOW
Hemoglobin: 6.8 — CL
Operator id: 126371
Potassium: 7.1
Sodium: 133 — ABNORMAL LOW
TCO2: 17
pCO2 arterial: 35
pH, Arterial: 7.267 — ABNORMAL LOW

## 2010-11-29 LAB — APTT
aPTT: 33
aPTT: 39 — ABNORMAL HIGH

## 2010-11-29 LAB — URINE MICROSCOPIC-ADD ON

## 2010-11-29 LAB — IGG, IGA, IGM
IgA: 616 — ABNORMAL HIGH
IgG (Immunoglobin G), Serum: 3770 — ABNORMAL HIGH
IgM, Serum: 151

## 2010-11-29 LAB — HEPATITIS C ANTIBODY: HCV Ab: NEGATIVE

## 2010-11-29 LAB — PROTIME-INR
INR: 1
INR: 1
Prothrombin Time: 13.5
Prothrombin Time: 13.8

## 2010-11-29 LAB — ANTISTREPTOLYSIN O TITER: ASO: 34 (ref 0–116)

## 2010-11-29 LAB — ANTI-NUCLEAR AB-TITER (ANA TITER): ANA Titer 1: 1:80 {titer} — ABNORMAL HIGH

## 2010-11-29 LAB — PTH, INTACT AND CALCIUM
Calcium, Total (PTH): 8 — ABNORMAL LOW
PTH: 236.8 — ABNORMAL HIGH

## 2010-11-29 LAB — PROTEIN / CREATININE RATIO, URINE
Creatinine, Urine: 90.5
Protein Creatinine Ratio: 2.92 — ABNORMAL HIGH
Total Protein, Urine: 264

## 2010-11-29 LAB — ANA: Anti Nuclear Antibody(ANA): POSITIVE — AB

## 2010-11-29 LAB — ANTI-NEUTROPHIL ANTIBODY: Cytoplasmic Neutrophilic Ab: 1:80 {titer} — ABNORMAL HIGH

## 2010-11-29 LAB — GLOMERULAR BASEMENT MEMBRANE ANTIBODIES: GBM Ab: 0 AU/mL

## 2010-11-29 LAB — FERRITIN: Ferritin: 734 — ABNORMAL HIGH (ref 22–322)

## 2010-11-29 LAB — PSA: PSA: 0.2

## 2010-11-29 LAB — ANTI-DNA ANTIBODY, DOUBLE-STRANDED: ds DNA Ab: 1 IU/mL (ref <5)

## 2010-11-29 LAB — HIV ANTIBODY (ROUTINE TESTING W REFLEX): HIV: NONREACTIVE

## 2010-11-29 LAB — IRON AND TIBC
Iron: 29 — ABNORMAL LOW
Saturation Ratios: 14 — ABNORMAL LOW
TIBC: 212 — ABNORMAL LOW
UIBC: 183

## 2010-11-29 LAB — HEPATITIS B SURFACE ANTIGEN: Hepatitis B Surface Ag: NEGATIVE

## 2010-11-29 LAB — C4 COMPLEMENT: Complement C4, Body Fluid: 24

## 2010-11-29 LAB — PHOSPHORUS
Phosphorus: 10.5 — ABNORMAL HIGH
Phosphorus: 4.7 — ABNORMAL HIGH
Phosphorus: 7.4 — ABNORMAL HIGH
Phosphorus: 9.3 — ABNORMAL HIGH

## 2010-11-29 LAB — URINE CULTURE
Colony Count: NO GROWTH
Culture: NO GROWTH

## 2010-11-29 LAB — HEPATITIS B E ANTIGEN: Hep B E Ag: NEGATIVE

## 2010-11-29 LAB — C3 COMPLEMENT: C3 Complement: 67 — ABNORMAL LOW

## 2010-11-29 LAB — COMPLEMENT, TOTAL: Compl, Total (CH50): 57 U/mL (ref 31–66)

## 2010-11-29 LAB — BLEEDING TIME: Bleeding Time: 7.5

## 2010-11-29 LAB — POTASSIUM: Potassium: 4.8

## 2010-11-29 LAB — IMMUNOFIXATION ADD-ON

## 2010-11-29 LAB — TSH: TSH: 2.183

## 2011-02-14 DIAGNOSIS — D631 Anemia in chronic kidney disease: Secondary | ICD-10-CM | POA: Diagnosis not present

## 2011-02-14 DIAGNOSIS — N2581 Secondary hyperparathyroidism of renal origin: Secondary | ICD-10-CM | POA: Diagnosis not present

## 2011-02-14 DIAGNOSIS — D509 Iron deficiency anemia, unspecified: Secondary | ICD-10-CM | POA: Diagnosis not present

## 2011-02-14 DIAGNOSIS — N039 Chronic nephritic syndrome with unspecified morphologic changes: Secondary | ICD-10-CM | POA: Diagnosis not present

## 2011-02-14 DIAGNOSIS — N186 End stage renal disease: Secondary | ICD-10-CM | POA: Diagnosis not present

## 2011-02-16 DIAGNOSIS — N186 End stage renal disease: Secondary | ICD-10-CM | POA: Diagnosis not present

## 2011-02-16 DIAGNOSIS — D509 Iron deficiency anemia, unspecified: Secondary | ICD-10-CM | POA: Diagnosis not present

## 2011-02-16 DIAGNOSIS — N2581 Secondary hyperparathyroidism of renal origin: Secondary | ICD-10-CM | POA: Diagnosis not present

## 2011-02-16 DIAGNOSIS — D631 Anemia in chronic kidney disease: Secondary | ICD-10-CM | POA: Diagnosis not present

## 2011-02-16 DIAGNOSIS — N039 Chronic nephritic syndrome with unspecified morphologic changes: Secondary | ICD-10-CM | POA: Diagnosis not present

## 2011-02-19 DIAGNOSIS — D509 Iron deficiency anemia, unspecified: Secondary | ICD-10-CM | POA: Diagnosis not present

## 2011-02-19 DIAGNOSIS — N186 End stage renal disease: Secondary | ICD-10-CM | POA: Diagnosis not present

## 2011-02-19 DIAGNOSIS — D631 Anemia in chronic kidney disease: Secondary | ICD-10-CM | POA: Diagnosis not present

## 2011-02-19 DIAGNOSIS — N2581 Secondary hyperparathyroidism of renal origin: Secondary | ICD-10-CM | POA: Diagnosis not present

## 2011-02-21 DIAGNOSIS — D509 Iron deficiency anemia, unspecified: Secondary | ICD-10-CM | POA: Diagnosis not present

## 2011-02-21 DIAGNOSIS — N186 End stage renal disease: Secondary | ICD-10-CM | POA: Diagnosis not present

## 2011-02-21 DIAGNOSIS — D631 Anemia in chronic kidney disease: Secondary | ICD-10-CM | POA: Diagnosis not present

## 2011-02-21 DIAGNOSIS — N2581 Secondary hyperparathyroidism of renal origin: Secondary | ICD-10-CM | POA: Diagnosis not present

## 2011-02-23 DIAGNOSIS — N186 End stage renal disease: Secondary | ICD-10-CM | POA: Diagnosis not present

## 2011-02-23 DIAGNOSIS — D509 Iron deficiency anemia, unspecified: Secondary | ICD-10-CM | POA: Diagnosis not present

## 2011-02-23 DIAGNOSIS — N2581 Secondary hyperparathyroidism of renal origin: Secondary | ICD-10-CM | POA: Diagnosis not present

## 2011-02-23 DIAGNOSIS — N039 Chronic nephritic syndrome with unspecified morphologic changes: Secondary | ICD-10-CM | POA: Diagnosis not present

## 2011-02-23 DIAGNOSIS — D631 Anemia in chronic kidney disease: Secondary | ICD-10-CM | POA: Diagnosis not present

## 2011-02-26 DIAGNOSIS — D509 Iron deficiency anemia, unspecified: Secondary | ICD-10-CM | POA: Diagnosis not present

## 2011-02-26 DIAGNOSIS — D631 Anemia in chronic kidney disease: Secondary | ICD-10-CM | POA: Diagnosis not present

## 2011-02-26 DIAGNOSIS — N186 End stage renal disease: Secondary | ICD-10-CM | POA: Diagnosis not present

## 2011-02-26 DIAGNOSIS — N2581 Secondary hyperparathyroidism of renal origin: Secondary | ICD-10-CM | POA: Diagnosis not present

## 2011-02-28 DIAGNOSIS — D509 Iron deficiency anemia, unspecified: Secondary | ICD-10-CM | POA: Diagnosis not present

## 2011-02-28 DIAGNOSIS — N186 End stage renal disease: Secondary | ICD-10-CM | POA: Diagnosis not present

## 2011-02-28 DIAGNOSIS — D631 Anemia in chronic kidney disease: Secondary | ICD-10-CM | POA: Diagnosis not present

## 2011-02-28 DIAGNOSIS — N039 Chronic nephritic syndrome with unspecified morphologic changes: Secondary | ICD-10-CM | POA: Diagnosis not present

## 2011-02-28 DIAGNOSIS — N2581 Secondary hyperparathyroidism of renal origin: Secondary | ICD-10-CM | POA: Diagnosis not present

## 2011-03-02 DIAGNOSIS — N2581 Secondary hyperparathyroidism of renal origin: Secondary | ICD-10-CM | POA: Diagnosis not present

## 2011-03-02 DIAGNOSIS — D509 Iron deficiency anemia, unspecified: Secondary | ICD-10-CM | POA: Diagnosis not present

## 2011-03-02 DIAGNOSIS — N039 Chronic nephritic syndrome with unspecified morphologic changes: Secondary | ICD-10-CM | POA: Diagnosis not present

## 2011-03-02 DIAGNOSIS — D631 Anemia in chronic kidney disease: Secondary | ICD-10-CM | POA: Diagnosis not present

## 2011-03-02 DIAGNOSIS — N186 End stage renal disease: Secondary | ICD-10-CM | POA: Diagnosis not present

## 2011-03-05 DIAGNOSIS — D631 Anemia in chronic kidney disease: Secondary | ICD-10-CM | POA: Diagnosis not present

## 2011-03-05 DIAGNOSIS — N2581 Secondary hyperparathyroidism of renal origin: Secondary | ICD-10-CM | POA: Diagnosis not present

## 2011-03-05 DIAGNOSIS — N186 End stage renal disease: Secondary | ICD-10-CM | POA: Diagnosis not present

## 2011-03-05 DIAGNOSIS — D509 Iron deficiency anemia, unspecified: Secondary | ICD-10-CM | POA: Diagnosis not present

## 2011-03-05 DIAGNOSIS — N039 Chronic nephritic syndrome with unspecified morphologic changes: Secondary | ICD-10-CM | POA: Diagnosis not present

## 2011-03-07 DIAGNOSIS — E1129 Type 2 diabetes mellitus with other diabetic kidney complication: Secondary | ICD-10-CM | POA: Diagnosis not present

## 2011-03-07 DIAGNOSIS — N039 Chronic nephritic syndrome with unspecified morphologic changes: Secondary | ICD-10-CM | POA: Diagnosis not present

## 2011-03-07 DIAGNOSIS — D509 Iron deficiency anemia, unspecified: Secondary | ICD-10-CM | POA: Diagnosis not present

## 2011-03-07 DIAGNOSIS — N186 End stage renal disease: Secondary | ICD-10-CM | POA: Diagnosis not present

## 2011-03-07 DIAGNOSIS — N2581 Secondary hyperparathyroidism of renal origin: Secondary | ICD-10-CM | POA: Diagnosis not present

## 2011-03-07 DIAGNOSIS — D631 Anemia in chronic kidney disease: Secondary | ICD-10-CM | POA: Diagnosis not present

## 2011-03-09 DIAGNOSIS — N2581 Secondary hyperparathyroidism of renal origin: Secondary | ICD-10-CM | POA: Diagnosis not present

## 2011-03-09 DIAGNOSIS — D631 Anemia in chronic kidney disease: Secondary | ICD-10-CM | POA: Diagnosis not present

## 2011-03-09 DIAGNOSIS — D509 Iron deficiency anemia, unspecified: Secondary | ICD-10-CM | POA: Diagnosis not present

## 2011-03-09 DIAGNOSIS — N186 End stage renal disease: Secondary | ICD-10-CM | POA: Diagnosis not present

## 2011-03-09 DIAGNOSIS — N039 Chronic nephritic syndrome with unspecified morphologic changes: Secondary | ICD-10-CM | POA: Diagnosis not present

## 2011-03-12 DIAGNOSIS — D631 Anemia in chronic kidney disease: Secondary | ICD-10-CM | POA: Diagnosis not present

## 2011-03-12 DIAGNOSIS — N2581 Secondary hyperparathyroidism of renal origin: Secondary | ICD-10-CM | POA: Diagnosis not present

## 2011-03-12 DIAGNOSIS — N186 End stage renal disease: Secondary | ICD-10-CM | POA: Diagnosis not present

## 2011-03-12 DIAGNOSIS — D509 Iron deficiency anemia, unspecified: Secondary | ICD-10-CM | POA: Diagnosis not present

## 2011-03-12 DIAGNOSIS — N039 Chronic nephritic syndrome with unspecified morphologic changes: Secondary | ICD-10-CM | POA: Diagnosis not present

## 2011-03-14 DIAGNOSIS — N2581 Secondary hyperparathyroidism of renal origin: Secondary | ICD-10-CM | POA: Diagnosis not present

## 2011-03-14 DIAGNOSIS — D631 Anemia in chronic kidney disease: Secondary | ICD-10-CM | POA: Diagnosis not present

## 2011-03-14 DIAGNOSIS — N039 Chronic nephritic syndrome with unspecified morphologic changes: Secondary | ICD-10-CM | POA: Diagnosis not present

## 2011-03-14 DIAGNOSIS — D509 Iron deficiency anemia, unspecified: Secondary | ICD-10-CM | POA: Diagnosis not present

## 2011-03-14 DIAGNOSIS — N186 End stage renal disease: Secondary | ICD-10-CM | POA: Diagnosis not present

## 2011-03-15 DIAGNOSIS — N186 End stage renal disease: Secondary | ICD-10-CM | POA: Diagnosis not present

## 2011-03-16 DIAGNOSIS — N2581 Secondary hyperparathyroidism of renal origin: Secondary | ICD-10-CM | POA: Diagnosis not present

## 2011-03-16 DIAGNOSIS — D631 Anemia in chronic kidney disease: Secondary | ICD-10-CM | POA: Diagnosis not present

## 2011-03-16 DIAGNOSIS — N186 End stage renal disease: Secondary | ICD-10-CM | POA: Diagnosis not present

## 2011-04-12 DIAGNOSIS — N186 End stage renal disease: Secondary | ICD-10-CM | POA: Diagnosis not present

## 2011-04-13 DIAGNOSIS — N186 End stage renal disease: Secondary | ICD-10-CM | POA: Diagnosis not present

## 2011-04-16 DIAGNOSIS — D631 Anemia in chronic kidney disease: Secondary | ICD-10-CM | POA: Diagnosis not present

## 2011-04-16 DIAGNOSIS — N039 Chronic nephritic syndrome with unspecified morphologic changes: Secondary | ICD-10-CM | POA: Diagnosis not present

## 2011-04-16 DIAGNOSIS — N2581 Secondary hyperparathyroidism of renal origin: Secondary | ICD-10-CM | POA: Diagnosis not present

## 2011-04-16 DIAGNOSIS — N186 End stage renal disease: Secondary | ICD-10-CM | POA: Diagnosis not present

## 2011-04-16 DIAGNOSIS — D509 Iron deficiency anemia, unspecified: Secondary | ICD-10-CM | POA: Diagnosis not present

## 2011-04-18 DIAGNOSIS — D509 Iron deficiency anemia, unspecified: Secondary | ICD-10-CM | POA: Diagnosis not present

## 2011-04-18 DIAGNOSIS — N2581 Secondary hyperparathyroidism of renal origin: Secondary | ICD-10-CM | POA: Diagnosis not present

## 2011-04-18 DIAGNOSIS — D631 Anemia in chronic kidney disease: Secondary | ICD-10-CM | POA: Diagnosis not present

## 2011-04-18 DIAGNOSIS — N186 End stage renal disease: Secondary | ICD-10-CM | POA: Diagnosis not present

## 2011-04-20 DIAGNOSIS — N186 End stage renal disease: Secondary | ICD-10-CM | POA: Diagnosis not present

## 2011-04-20 DIAGNOSIS — N039 Chronic nephritic syndrome with unspecified morphologic changes: Secondary | ICD-10-CM | POA: Diagnosis not present

## 2011-04-20 DIAGNOSIS — D509 Iron deficiency anemia, unspecified: Secondary | ICD-10-CM | POA: Diagnosis not present

## 2011-04-20 DIAGNOSIS — N2581 Secondary hyperparathyroidism of renal origin: Secondary | ICD-10-CM | POA: Diagnosis not present

## 2011-04-20 DIAGNOSIS — D631 Anemia in chronic kidney disease: Secondary | ICD-10-CM | POA: Diagnosis not present

## 2011-04-23 DIAGNOSIS — N039 Chronic nephritic syndrome with unspecified morphologic changes: Secondary | ICD-10-CM | POA: Diagnosis not present

## 2011-04-23 DIAGNOSIS — N2581 Secondary hyperparathyroidism of renal origin: Secondary | ICD-10-CM | POA: Diagnosis not present

## 2011-04-23 DIAGNOSIS — D631 Anemia in chronic kidney disease: Secondary | ICD-10-CM | POA: Diagnosis not present

## 2011-04-23 DIAGNOSIS — D509 Iron deficiency anemia, unspecified: Secondary | ICD-10-CM | POA: Diagnosis not present

## 2011-04-23 DIAGNOSIS — N186 End stage renal disease: Secondary | ICD-10-CM | POA: Diagnosis not present

## 2011-04-25 DIAGNOSIS — N2581 Secondary hyperparathyroidism of renal origin: Secondary | ICD-10-CM | POA: Diagnosis not present

## 2011-04-25 DIAGNOSIS — D631 Anemia in chronic kidney disease: Secondary | ICD-10-CM | POA: Diagnosis not present

## 2011-04-25 DIAGNOSIS — D509 Iron deficiency anemia, unspecified: Secondary | ICD-10-CM | POA: Diagnosis not present

## 2011-04-25 DIAGNOSIS — N186 End stage renal disease: Secondary | ICD-10-CM | POA: Diagnosis not present

## 2011-04-27 DIAGNOSIS — N2581 Secondary hyperparathyroidism of renal origin: Secondary | ICD-10-CM | POA: Diagnosis not present

## 2011-04-27 DIAGNOSIS — N039 Chronic nephritic syndrome with unspecified morphologic changes: Secondary | ICD-10-CM | POA: Diagnosis not present

## 2011-04-27 DIAGNOSIS — D631 Anemia in chronic kidney disease: Secondary | ICD-10-CM | POA: Diagnosis not present

## 2011-04-27 DIAGNOSIS — D509 Iron deficiency anemia, unspecified: Secondary | ICD-10-CM | POA: Diagnosis not present

## 2011-04-27 DIAGNOSIS — N186 End stage renal disease: Secondary | ICD-10-CM | POA: Diagnosis not present

## 2011-04-30 DIAGNOSIS — N2581 Secondary hyperparathyroidism of renal origin: Secondary | ICD-10-CM | POA: Diagnosis not present

## 2011-04-30 DIAGNOSIS — D631 Anemia in chronic kidney disease: Secondary | ICD-10-CM | POA: Diagnosis not present

## 2011-04-30 DIAGNOSIS — N039 Chronic nephritic syndrome with unspecified morphologic changes: Secondary | ICD-10-CM | POA: Diagnosis not present

## 2011-04-30 DIAGNOSIS — N186 End stage renal disease: Secondary | ICD-10-CM | POA: Diagnosis not present

## 2011-04-30 DIAGNOSIS — D509 Iron deficiency anemia, unspecified: Secondary | ICD-10-CM | POA: Diagnosis not present

## 2011-05-02 DIAGNOSIS — N2581 Secondary hyperparathyroidism of renal origin: Secondary | ICD-10-CM | POA: Diagnosis not present

## 2011-05-02 DIAGNOSIS — D631 Anemia in chronic kidney disease: Secondary | ICD-10-CM | POA: Diagnosis not present

## 2011-05-02 DIAGNOSIS — N186 End stage renal disease: Secondary | ICD-10-CM | POA: Diagnosis not present

## 2011-05-02 DIAGNOSIS — D509 Iron deficiency anemia, unspecified: Secondary | ICD-10-CM | POA: Diagnosis not present

## 2011-05-04 DIAGNOSIS — N2581 Secondary hyperparathyroidism of renal origin: Secondary | ICD-10-CM | POA: Diagnosis not present

## 2011-05-04 DIAGNOSIS — N186 End stage renal disease: Secondary | ICD-10-CM | POA: Diagnosis not present

## 2011-05-04 DIAGNOSIS — D509 Iron deficiency anemia, unspecified: Secondary | ICD-10-CM | POA: Diagnosis not present

## 2011-05-04 DIAGNOSIS — D631 Anemia in chronic kidney disease: Secondary | ICD-10-CM | POA: Diagnosis not present

## 2011-05-07 DIAGNOSIS — D631 Anemia in chronic kidney disease: Secondary | ICD-10-CM | POA: Diagnosis not present

## 2011-05-07 DIAGNOSIS — N186 End stage renal disease: Secondary | ICD-10-CM | POA: Diagnosis not present

## 2011-05-07 DIAGNOSIS — D509 Iron deficiency anemia, unspecified: Secondary | ICD-10-CM | POA: Diagnosis not present

## 2011-05-07 DIAGNOSIS — N039 Chronic nephritic syndrome with unspecified morphologic changes: Secondary | ICD-10-CM | POA: Diagnosis not present

## 2011-05-07 DIAGNOSIS — N2581 Secondary hyperparathyroidism of renal origin: Secondary | ICD-10-CM | POA: Diagnosis not present

## 2011-05-09 DIAGNOSIS — N186 End stage renal disease: Secondary | ICD-10-CM | POA: Diagnosis not present

## 2011-05-09 DIAGNOSIS — D509 Iron deficiency anemia, unspecified: Secondary | ICD-10-CM | POA: Diagnosis not present

## 2011-05-09 DIAGNOSIS — N2581 Secondary hyperparathyroidism of renal origin: Secondary | ICD-10-CM | POA: Diagnosis not present

## 2011-05-09 DIAGNOSIS — D631 Anemia in chronic kidney disease: Secondary | ICD-10-CM | POA: Diagnosis not present

## 2011-05-11 DIAGNOSIS — D631 Anemia in chronic kidney disease: Secondary | ICD-10-CM | POA: Diagnosis not present

## 2011-05-11 DIAGNOSIS — D509 Iron deficiency anemia, unspecified: Secondary | ICD-10-CM | POA: Diagnosis not present

## 2011-05-11 DIAGNOSIS — N039 Chronic nephritic syndrome with unspecified morphologic changes: Secondary | ICD-10-CM | POA: Diagnosis not present

## 2011-05-11 DIAGNOSIS — N2581 Secondary hyperparathyroidism of renal origin: Secondary | ICD-10-CM | POA: Diagnosis not present

## 2011-05-11 DIAGNOSIS — N186 End stage renal disease: Secondary | ICD-10-CM | POA: Diagnosis not present

## 2011-05-13 DIAGNOSIS — N186 End stage renal disease: Secondary | ICD-10-CM | POA: Diagnosis not present

## 2011-05-14 ENCOUNTER — Other Ambulatory Visit (HOSPITAL_COMMUNITY): Payer: Self-pay | Admitting: Nephrology

## 2011-05-14 DIAGNOSIS — N2581 Secondary hyperparathyroidism of renal origin: Secondary | ICD-10-CM | POA: Diagnosis not present

## 2011-05-14 DIAGNOSIS — N186 End stage renal disease: Secondary | ICD-10-CM | POA: Diagnosis not present

## 2011-05-14 DIAGNOSIS — D509 Iron deficiency anemia, unspecified: Secondary | ICD-10-CM | POA: Diagnosis not present

## 2011-05-14 DIAGNOSIS — R519 Headache, unspecified: Secondary | ICD-10-CM

## 2011-05-14 DIAGNOSIS — D631 Anemia in chronic kidney disease: Secondary | ICD-10-CM | POA: Diagnosis not present

## 2011-05-17 ENCOUNTER — Ambulatory Visit (HOSPITAL_COMMUNITY)
Admission: RE | Admit: 2011-05-17 | Discharge: 2011-05-17 | Disposition: A | Discharge status: Home / Self Care | Payer: Medicare Other | Source: Ambulatory Visit | Attending: Nephrology | Admitting: Nephrology

## 2011-05-17 DIAGNOSIS — R519 Headache, unspecified: Secondary | ICD-10-CM

## 2011-05-17 DIAGNOSIS — R51 Headache: Secondary | ICD-10-CM | POA: Insufficient documentation

## 2011-05-17 MED ORDER — IOHEXOL 300 MG/ML  SOLN
80.0000 mL | Freq: Once | INTRAMUSCULAR | Status: AC | PRN
  Administered 2011-05-17: 80 mL via INTRAVENOUS

## 2011-05-22 DIAGNOSIS — R51 Headache: Secondary | ICD-10-CM | POA: Diagnosis not present

## 2011-05-22 DIAGNOSIS — H5310 Unspecified subjective visual disturbances: Secondary | ICD-10-CM | POA: Diagnosis not present

## 2011-06-06 DIAGNOSIS — E1129 Type 2 diabetes mellitus with other diabetic kidney complication: Secondary | ICD-10-CM | POA: Diagnosis not present

## 2011-06-12 DIAGNOSIS — N186 End stage renal disease: Secondary | ICD-10-CM | POA: Diagnosis not present

## 2011-06-13 DIAGNOSIS — E875 Hyperkalemia: Secondary | ICD-10-CM | POA: Diagnosis not present

## 2011-06-13 DIAGNOSIS — D509 Iron deficiency anemia, unspecified: Secondary | ICD-10-CM | POA: Diagnosis not present

## 2011-06-13 DIAGNOSIS — N186 End stage renal disease: Secondary | ICD-10-CM | POA: Diagnosis not present

## 2011-06-13 DIAGNOSIS — N2581 Secondary hyperparathyroidism of renal origin: Secondary | ICD-10-CM | POA: Diagnosis not present

## 2011-06-13 DIAGNOSIS — D631 Anemia in chronic kidney disease: Secondary | ICD-10-CM | POA: Diagnosis not present

## 2011-06-15 DIAGNOSIS — H47329 Drusen of optic disc, unspecified eye: Secondary | ICD-10-CM | POA: Diagnosis not present

## 2011-06-27 DIAGNOSIS — Z7682 Awaiting organ transplant status: Secondary | ICD-10-CM | POA: Diagnosis not present

## 2011-06-27 DIAGNOSIS — Z5181 Encounter for therapeutic drug level monitoring: Secondary | ICD-10-CM | POA: Diagnosis not present

## 2011-07-03 DIAGNOSIS — R51 Headache: Secondary | ICD-10-CM | POA: Diagnosis not present

## 2011-07-13 DIAGNOSIS — N186 End stage renal disease: Secondary | ICD-10-CM | POA: Diagnosis not present

## 2011-07-16 DIAGNOSIS — D509 Iron deficiency anemia, unspecified: Secondary | ICD-10-CM | POA: Diagnosis not present

## 2011-07-16 DIAGNOSIS — D631 Anemia in chronic kidney disease: Secondary | ICD-10-CM | POA: Diagnosis not present

## 2011-07-16 DIAGNOSIS — N186 End stage renal disease: Secondary | ICD-10-CM | POA: Diagnosis not present

## 2011-07-16 DIAGNOSIS — N039 Chronic nephritic syndrome with unspecified morphologic changes: Secondary | ICD-10-CM | POA: Diagnosis not present

## 2011-07-16 DIAGNOSIS — N2581 Secondary hyperparathyroidism of renal origin: Secondary | ICD-10-CM | POA: Diagnosis not present

## 2011-07-16 DIAGNOSIS — E875 Hyperkalemia: Secondary | ICD-10-CM | POA: Diagnosis not present

## 2011-07-18 DIAGNOSIS — N186 End stage renal disease: Secondary | ICD-10-CM | POA: Diagnosis not present

## 2011-07-18 DIAGNOSIS — D509 Iron deficiency anemia, unspecified: Secondary | ICD-10-CM | POA: Diagnosis not present

## 2011-07-18 DIAGNOSIS — N2581 Secondary hyperparathyroidism of renal origin: Secondary | ICD-10-CM | POA: Diagnosis not present

## 2011-07-18 DIAGNOSIS — D631 Anemia in chronic kidney disease: Secondary | ICD-10-CM | POA: Diagnosis not present

## 2011-07-18 DIAGNOSIS — E875 Hyperkalemia: Secondary | ICD-10-CM | POA: Diagnosis not present

## 2011-07-20 DIAGNOSIS — N039 Chronic nephritic syndrome with unspecified morphologic changes: Secondary | ICD-10-CM | POA: Diagnosis not present

## 2011-07-20 DIAGNOSIS — N2581 Secondary hyperparathyroidism of renal origin: Secondary | ICD-10-CM | POA: Diagnosis not present

## 2011-07-20 DIAGNOSIS — D631 Anemia in chronic kidney disease: Secondary | ICD-10-CM | POA: Diagnosis not present

## 2011-07-20 DIAGNOSIS — D509 Iron deficiency anemia, unspecified: Secondary | ICD-10-CM | POA: Diagnosis not present

## 2011-07-20 DIAGNOSIS — E875 Hyperkalemia: Secondary | ICD-10-CM | POA: Diagnosis not present

## 2011-07-20 DIAGNOSIS — N186 End stage renal disease: Secondary | ICD-10-CM | POA: Diagnosis not present

## 2011-07-23 DIAGNOSIS — D509 Iron deficiency anemia, unspecified: Secondary | ICD-10-CM | POA: Diagnosis not present

## 2011-07-23 DIAGNOSIS — N186 End stage renal disease: Secondary | ICD-10-CM | POA: Diagnosis not present

## 2011-07-23 DIAGNOSIS — D631 Anemia in chronic kidney disease: Secondary | ICD-10-CM | POA: Diagnosis not present

## 2011-07-23 DIAGNOSIS — E875 Hyperkalemia: Secondary | ICD-10-CM | POA: Diagnosis not present

## 2011-07-23 DIAGNOSIS — N2581 Secondary hyperparathyroidism of renal origin: Secondary | ICD-10-CM | POA: Diagnosis not present

## 2011-07-25 DIAGNOSIS — D509 Iron deficiency anemia, unspecified: Secondary | ICD-10-CM | POA: Diagnosis not present

## 2011-07-25 DIAGNOSIS — N039 Chronic nephritic syndrome with unspecified morphologic changes: Secondary | ICD-10-CM | POA: Diagnosis not present

## 2011-07-25 DIAGNOSIS — E875 Hyperkalemia: Secondary | ICD-10-CM | POA: Diagnosis not present

## 2011-07-25 DIAGNOSIS — N186 End stage renal disease: Secondary | ICD-10-CM | POA: Diagnosis not present

## 2011-07-25 DIAGNOSIS — D631 Anemia in chronic kidney disease: Secondary | ICD-10-CM | POA: Diagnosis not present

## 2011-07-25 DIAGNOSIS — N2581 Secondary hyperparathyroidism of renal origin: Secondary | ICD-10-CM | POA: Diagnosis not present

## 2011-07-27 DIAGNOSIS — D509 Iron deficiency anemia, unspecified: Secondary | ICD-10-CM | POA: Diagnosis not present

## 2011-07-27 DIAGNOSIS — D631 Anemia in chronic kidney disease: Secondary | ICD-10-CM | POA: Diagnosis not present

## 2011-07-27 DIAGNOSIS — E875 Hyperkalemia: Secondary | ICD-10-CM | POA: Diagnosis not present

## 2011-07-27 DIAGNOSIS — N186 End stage renal disease: Secondary | ICD-10-CM | POA: Diagnosis not present

## 2011-07-27 DIAGNOSIS — N2581 Secondary hyperparathyroidism of renal origin: Secondary | ICD-10-CM | POA: Diagnosis not present

## 2011-07-27 DIAGNOSIS — N039 Chronic nephritic syndrome with unspecified morphologic changes: Secondary | ICD-10-CM | POA: Diagnosis not present

## 2011-07-30 DIAGNOSIS — E875 Hyperkalemia: Secondary | ICD-10-CM | POA: Diagnosis not present

## 2011-07-30 DIAGNOSIS — N039 Chronic nephritic syndrome with unspecified morphologic changes: Secondary | ICD-10-CM | POA: Diagnosis not present

## 2011-07-30 DIAGNOSIS — D631 Anemia in chronic kidney disease: Secondary | ICD-10-CM | POA: Diagnosis not present

## 2011-07-30 DIAGNOSIS — N186 End stage renal disease: Secondary | ICD-10-CM | POA: Diagnosis not present

## 2011-07-30 DIAGNOSIS — D509 Iron deficiency anemia, unspecified: Secondary | ICD-10-CM | POA: Diagnosis not present

## 2011-07-30 DIAGNOSIS — N2581 Secondary hyperparathyroidism of renal origin: Secondary | ICD-10-CM | POA: Diagnosis not present

## 2011-08-01 DIAGNOSIS — N186 End stage renal disease: Secondary | ICD-10-CM | POA: Diagnosis not present

## 2011-08-01 DIAGNOSIS — D631 Anemia in chronic kidney disease: Secondary | ICD-10-CM | POA: Diagnosis not present

## 2011-08-01 DIAGNOSIS — E875 Hyperkalemia: Secondary | ICD-10-CM | POA: Diagnosis not present

## 2011-08-01 DIAGNOSIS — D509 Iron deficiency anemia, unspecified: Secondary | ICD-10-CM | POA: Diagnosis not present

## 2011-08-01 DIAGNOSIS — N2581 Secondary hyperparathyroidism of renal origin: Secondary | ICD-10-CM | POA: Diagnosis not present

## 2011-08-03 DIAGNOSIS — N2581 Secondary hyperparathyroidism of renal origin: Secondary | ICD-10-CM | POA: Diagnosis not present

## 2011-08-03 DIAGNOSIS — D631 Anemia in chronic kidney disease: Secondary | ICD-10-CM | POA: Diagnosis not present

## 2011-08-03 DIAGNOSIS — E875 Hyperkalemia: Secondary | ICD-10-CM | POA: Diagnosis not present

## 2011-08-03 DIAGNOSIS — N186 End stage renal disease: Secondary | ICD-10-CM | POA: Diagnosis not present

## 2011-08-03 DIAGNOSIS — D509 Iron deficiency anemia, unspecified: Secondary | ICD-10-CM | POA: Diagnosis not present

## 2011-08-06 DIAGNOSIS — N186 End stage renal disease: Secondary | ICD-10-CM | POA: Diagnosis not present

## 2011-08-06 DIAGNOSIS — E875 Hyperkalemia: Secondary | ICD-10-CM | POA: Diagnosis not present

## 2011-08-06 DIAGNOSIS — N2581 Secondary hyperparathyroidism of renal origin: Secondary | ICD-10-CM | POA: Diagnosis not present

## 2011-08-06 DIAGNOSIS — D509 Iron deficiency anemia, unspecified: Secondary | ICD-10-CM | POA: Diagnosis not present

## 2011-08-06 DIAGNOSIS — D631 Anemia in chronic kidney disease: Secondary | ICD-10-CM | POA: Diagnosis not present

## 2011-08-08 DIAGNOSIS — D509 Iron deficiency anemia, unspecified: Secondary | ICD-10-CM | POA: Diagnosis not present

## 2011-08-08 DIAGNOSIS — N2581 Secondary hyperparathyroidism of renal origin: Secondary | ICD-10-CM | POA: Diagnosis not present

## 2011-08-08 DIAGNOSIS — N186 End stage renal disease: Secondary | ICD-10-CM | POA: Diagnosis not present

## 2011-08-08 DIAGNOSIS — D631 Anemia in chronic kidney disease: Secondary | ICD-10-CM | POA: Diagnosis not present

## 2011-08-08 DIAGNOSIS — E875 Hyperkalemia: Secondary | ICD-10-CM | POA: Diagnosis not present

## 2011-08-10 DIAGNOSIS — N2581 Secondary hyperparathyroidism of renal origin: Secondary | ICD-10-CM | POA: Diagnosis not present

## 2011-08-10 DIAGNOSIS — N039 Chronic nephritic syndrome with unspecified morphologic changes: Secondary | ICD-10-CM | POA: Diagnosis not present

## 2011-08-10 DIAGNOSIS — E875 Hyperkalemia: Secondary | ICD-10-CM | POA: Diagnosis not present

## 2011-08-10 DIAGNOSIS — N186 End stage renal disease: Secondary | ICD-10-CM | POA: Diagnosis not present

## 2011-08-10 DIAGNOSIS — D509 Iron deficiency anemia, unspecified: Secondary | ICD-10-CM | POA: Diagnosis not present

## 2011-08-10 DIAGNOSIS — D631 Anemia in chronic kidney disease: Secondary | ICD-10-CM | POA: Diagnosis not present

## 2011-08-12 DIAGNOSIS — N186 End stage renal disease: Secondary | ICD-10-CM | POA: Diagnosis not present

## 2011-08-13 DIAGNOSIS — D509 Iron deficiency anemia, unspecified: Secondary | ICD-10-CM | POA: Diagnosis not present

## 2011-08-13 DIAGNOSIS — N2581 Secondary hyperparathyroidism of renal origin: Secondary | ICD-10-CM | POA: Diagnosis not present

## 2011-08-13 DIAGNOSIS — Z23 Encounter for immunization: Secondary | ICD-10-CM | POA: Diagnosis not present

## 2011-08-13 DIAGNOSIS — D631 Anemia in chronic kidney disease: Secondary | ICD-10-CM | POA: Diagnosis not present

## 2011-08-13 DIAGNOSIS — N186 End stage renal disease: Secondary | ICD-10-CM | POA: Diagnosis not present

## 2011-08-13 DIAGNOSIS — E875 Hyperkalemia: Secondary | ICD-10-CM | POA: Diagnosis not present

## 2011-09-05 DIAGNOSIS — E1129 Type 2 diabetes mellitus with other diabetic kidney complication: Secondary | ICD-10-CM | POA: Diagnosis not present

## 2011-09-12 DIAGNOSIS — N186 End stage renal disease: Secondary | ICD-10-CM | POA: Diagnosis not present

## 2011-09-14 DIAGNOSIS — D631 Anemia in chronic kidney disease: Secondary | ICD-10-CM | POA: Diagnosis not present

## 2011-09-14 DIAGNOSIS — D509 Iron deficiency anemia, unspecified: Secondary | ICD-10-CM | POA: Diagnosis not present

## 2011-09-14 DIAGNOSIS — E875 Hyperkalemia: Secondary | ICD-10-CM | POA: Diagnosis not present

## 2011-09-14 DIAGNOSIS — N186 End stage renal disease: Secondary | ICD-10-CM | POA: Diagnosis not present

## 2011-09-14 DIAGNOSIS — N2581 Secondary hyperparathyroidism of renal origin: Secondary | ICD-10-CM | POA: Diagnosis not present

## 2011-10-13 DIAGNOSIS — N186 End stage renal disease: Secondary | ICD-10-CM | POA: Diagnosis not present

## 2011-10-15 DIAGNOSIS — N2581 Secondary hyperparathyroidism of renal origin: Secondary | ICD-10-CM | POA: Diagnosis not present

## 2011-10-15 DIAGNOSIS — E875 Hyperkalemia: Secondary | ICD-10-CM | POA: Diagnosis not present

## 2011-10-15 DIAGNOSIS — N186 End stage renal disease: Secondary | ICD-10-CM | POA: Diagnosis not present

## 2011-10-15 DIAGNOSIS — Z23 Encounter for immunization: Secondary | ICD-10-CM | POA: Diagnosis not present

## 2011-10-15 DIAGNOSIS — D631 Anemia in chronic kidney disease: Secondary | ICD-10-CM | POA: Diagnosis not present

## 2011-10-15 DIAGNOSIS — D509 Iron deficiency anemia, unspecified: Secondary | ICD-10-CM | POA: Diagnosis not present

## 2011-11-12 DIAGNOSIS — N186 End stage renal disease: Secondary | ICD-10-CM | POA: Diagnosis not present

## 2011-11-14 DIAGNOSIS — D631 Anemia in chronic kidney disease: Secondary | ICD-10-CM | POA: Diagnosis not present

## 2011-11-14 DIAGNOSIS — E875 Hyperkalemia: Secondary | ICD-10-CM | POA: Diagnosis not present

## 2011-11-14 DIAGNOSIS — N039 Chronic nephritic syndrome with unspecified morphologic changes: Secondary | ICD-10-CM | POA: Diagnosis not present

## 2011-11-14 DIAGNOSIS — N2581 Secondary hyperparathyroidism of renal origin: Secondary | ICD-10-CM | POA: Diagnosis not present

## 2011-11-14 DIAGNOSIS — D509 Iron deficiency anemia, unspecified: Secondary | ICD-10-CM | POA: Diagnosis not present

## 2011-11-14 DIAGNOSIS — N186 End stage renal disease: Secondary | ICD-10-CM | POA: Diagnosis not present

## 2011-11-16 DIAGNOSIS — E875 Hyperkalemia: Secondary | ICD-10-CM | POA: Diagnosis not present

## 2011-11-16 DIAGNOSIS — D509 Iron deficiency anemia, unspecified: Secondary | ICD-10-CM | POA: Diagnosis not present

## 2011-11-16 DIAGNOSIS — D631 Anemia in chronic kidney disease: Secondary | ICD-10-CM | POA: Diagnosis not present

## 2011-11-16 DIAGNOSIS — N186 End stage renal disease: Secondary | ICD-10-CM | POA: Diagnosis not present

## 2011-11-16 DIAGNOSIS — N039 Chronic nephritic syndrome with unspecified morphologic changes: Secondary | ICD-10-CM | POA: Diagnosis not present

## 2011-11-16 DIAGNOSIS — N2581 Secondary hyperparathyroidism of renal origin: Secondary | ICD-10-CM | POA: Diagnosis not present

## 2011-11-19 DIAGNOSIS — D631 Anemia in chronic kidney disease: Secondary | ICD-10-CM | POA: Diagnosis not present

## 2011-11-19 DIAGNOSIS — N2581 Secondary hyperparathyroidism of renal origin: Secondary | ICD-10-CM | POA: Diagnosis not present

## 2011-11-19 DIAGNOSIS — D509 Iron deficiency anemia, unspecified: Secondary | ICD-10-CM | POA: Diagnosis not present

## 2011-11-19 DIAGNOSIS — E875 Hyperkalemia: Secondary | ICD-10-CM | POA: Diagnosis not present

## 2011-11-19 DIAGNOSIS — N186 End stage renal disease: Secondary | ICD-10-CM | POA: Diagnosis not present

## 2011-11-21 DIAGNOSIS — D631 Anemia in chronic kidney disease: Secondary | ICD-10-CM | POA: Diagnosis not present

## 2011-11-21 DIAGNOSIS — N186 End stage renal disease: Secondary | ICD-10-CM | POA: Diagnosis not present

## 2011-11-21 DIAGNOSIS — N2581 Secondary hyperparathyroidism of renal origin: Secondary | ICD-10-CM | POA: Diagnosis not present

## 2011-11-21 DIAGNOSIS — E875 Hyperkalemia: Secondary | ICD-10-CM | POA: Diagnosis not present

## 2011-11-21 DIAGNOSIS — D509 Iron deficiency anemia, unspecified: Secondary | ICD-10-CM | POA: Diagnosis not present

## 2011-11-23 DIAGNOSIS — D509 Iron deficiency anemia, unspecified: Secondary | ICD-10-CM | POA: Diagnosis not present

## 2011-11-23 DIAGNOSIS — E875 Hyperkalemia: Secondary | ICD-10-CM | POA: Diagnosis not present

## 2011-11-23 DIAGNOSIS — N039 Chronic nephritic syndrome with unspecified morphologic changes: Secondary | ICD-10-CM | POA: Diagnosis not present

## 2011-11-23 DIAGNOSIS — N2581 Secondary hyperparathyroidism of renal origin: Secondary | ICD-10-CM | POA: Diagnosis not present

## 2011-11-23 DIAGNOSIS — N186 End stage renal disease: Secondary | ICD-10-CM | POA: Diagnosis not present

## 2011-11-23 DIAGNOSIS — D631 Anemia in chronic kidney disease: Secondary | ICD-10-CM | POA: Diagnosis not present

## 2011-11-26 DIAGNOSIS — N2581 Secondary hyperparathyroidism of renal origin: Secondary | ICD-10-CM | POA: Diagnosis not present

## 2011-11-26 DIAGNOSIS — D509 Iron deficiency anemia, unspecified: Secondary | ICD-10-CM | POA: Diagnosis not present

## 2011-11-26 DIAGNOSIS — E875 Hyperkalemia: Secondary | ICD-10-CM | POA: Diagnosis not present

## 2011-11-26 DIAGNOSIS — N186 End stage renal disease: Secondary | ICD-10-CM | POA: Diagnosis not present

## 2011-11-26 DIAGNOSIS — D631 Anemia in chronic kidney disease: Secondary | ICD-10-CM | POA: Diagnosis not present

## 2011-11-28 DIAGNOSIS — N2581 Secondary hyperparathyroidism of renal origin: Secondary | ICD-10-CM | POA: Diagnosis not present

## 2011-11-28 DIAGNOSIS — D631 Anemia in chronic kidney disease: Secondary | ICD-10-CM | POA: Diagnosis not present

## 2011-11-28 DIAGNOSIS — N039 Chronic nephritic syndrome with unspecified morphologic changes: Secondary | ICD-10-CM | POA: Diagnosis not present

## 2011-11-28 DIAGNOSIS — E875 Hyperkalemia: Secondary | ICD-10-CM | POA: Diagnosis not present

## 2011-11-28 DIAGNOSIS — N186 End stage renal disease: Secondary | ICD-10-CM | POA: Diagnosis not present

## 2011-11-28 DIAGNOSIS — D509 Iron deficiency anemia, unspecified: Secondary | ICD-10-CM | POA: Diagnosis not present

## 2011-11-30 DIAGNOSIS — N186 End stage renal disease: Secondary | ICD-10-CM | POA: Diagnosis not present

## 2011-11-30 DIAGNOSIS — D631 Anemia in chronic kidney disease: Secondary | ICD-10-CM | POA: Diagnosis not present

## 2011-11-30 DIAGNOSIS — D509 Iron deficiency anemia, unspecified: Secondary | ICD-10-CM | POA: Diagnosis not present

## 2011-11-30 DIAGNOSIS — N039 Chronic nephritic syndrome with unspecified morphologic changes: Secondary | ICD-10-CM | POA: Diagnosis not present

## 2011-11-30 DIAGNOSIS — N2581 Secondary hyperparathyroidism of renal origin: Secondary | ICD-10-CM | POA: Diagnosis not present

## 2011-11-30 DIAGNOSIS — E875 Hyperkalemia: Secondary | ICD-10-CM | POA: Diagnosis not present

## 2011-12-03 DIAGNOSIS — N186 End stage renal disease: Secondary | ICD-10-CM | POA: Diagnosis not present

## 2011-12-03 DIAGNOSIS — D509 Iron deficiency anemia, unspecified: Secondary | ICD-10-CM | POA: Diagnosis not present

## 2011-12-03 DIAGNOSIS — E875 Hyperkalemia: Secondary | ICD-10-CM | POA: Diagnosis not present

## 2011-12-03 DIAGNOSIS — D631 Anemia in chronic kidney disease: Secondary | ICD-10-CM | POA: Diagnosis not present

## 2011-12-03 DIAGNOSIS — N2581 Secondary hyperparathyroidism of renal origin: Secondary | ICD-10-CM | POA: Diagnosis not present

## 2011-12-05 DIAGNOSIS — N039 Chronic nephritic syndrome with unspecified morphologic changes: Secondary | ICD-10-CM | POA: Diagnosis not present

## 2011-12-05 DIAGNOSIS — E875 Hyperkalemia: Secondary | ICD-10-CM | POA: Diagnosis not present

## 2011-12-05 DIAGNOSIS — D509 Iron deficiency anemia, unspecified: Secondary | ICD-10-CM | POA: Diagnosis not present

## 2011-12-05 DIAGNOSIS — D631 Anemia in chronic kidney disease: Secondary | ICD-10-CM | POA: Diagnosis not present

## 2011-12-05 DIAGNOSIS — N2581 Secondary hyperparathyroidism of renal origin: Secondary | ICD-10-CM | POA: Diagnosis not present

## 2011-12-05 DIAGNOSIS — N186 End stage renal disease: Secondary | ICD-10-CM | POA: Diagnosis not present

## 2011-12-05 DIAGNOSIS — E1129 Type 2 diabetes mellitus with other diabetic kidney complication: Secondary | ICD-10-CM | POA: Diagnosis not present

## 2011-12-08 DIAGNOSIS — E875 Hyperkalemia: Secondary | ICD-10-CM | POA: Diagnosis not present

## 2011-12-08 DIAGNOSIS — N186 End stage renal disease: Secondary | ICD-10-CM | POA: Diagnosis not present

## 2011-12-08 DIAGNOSIS — D631 Anemia in chronic kidney disease: Secondary | ICD-10-CM | POA: Diagnosis not present

## 2011-12-08 DIAGNOSIS — D509 Iron deficiency anemia, unspecified: Secondary | ICD-10-CM | POA: Diagnosis not present

## 2011-12-08 DIAGNOSIS — N2581 Secondary hyperparathyroidism of renal origin: Secondary | ICD-10-CM | POA: Diagnosis not present

## 2011-12-10 DIAGNOSIS — D509 Iron deficiency anemia, unspecified: Secondary | ICD-10-CM | POA: Diagnosis not present

## 2011-12-10 DIAGNOSIS — E875 Hyperkalemia: Secondary | ICD-10-CM | POA: Diagnosis not present

## 2011-12-10 DIAGNOSIS — N2581 Secondary hyperparathyroidism of renal origin: Secondary | ICD-10-CM | POA: Diagnosis not present

## 2011-12-10 DIAGNOSIS — N039 Chronic nephritic syndrome with unspecified morphologic changes: Secondary | ICD-10-CM | POA: Diagnosis not present

## 2011-12-10 DIAGNOSIS — N186 End stage renal disease: Secondary | ICD-10-CM | POA: Diagnosis not present

## 2011-12-10 DIAGNOSIS — D631 Anemia in chronic kidney disease: Secondary | ICD-10-CM | POA: Diagnosis not present

## 2011-12-11 ENCOUNTER — Other Ambulatory Visit: Payer: Self-pay | Admitting: Diagnostic Neuroimaging

## 2011-12-11 DIAGNOSIS — R519 Headache, unspecified: Secondary | ICD-10-CM

## 2011-12-11 DIAGNOSIS — R51 Headache: Secondary | ICD-10-CM | POA: Diagnosis not present

## 2011-12-12 DIAGNOSIS — N2581 Secondary hyperparathyroidism of renal origin: Secondary | ICD-10-CM | POA: Diagnosis not present

## 2011-12-12 DIAGNOSIS — N186 End stage renal disease: Secondary | ICD-10-CM | POA: Diagnosis not present

## 2011-12-12 DIAGNOSIS — E875 Hyperkalemia: Secondary | ICD-10-CM | POA: Diagnosis not present

## 2011-12-12 DIAGNOSIS — D631 Anemia in chronic kidney disease: Secondary | ICD-10-CM | POA: Diagnosis not present

## 2011-12-12 DIAGNOSIS — D509 Iron deficiency anemia, unspecified: Secondary | ICD-10-CM | POA: Diagnosis not present

## 2011-12-13 ENCOUNTER — Other Ambulatory Visit: Payer: Medicare Other

## 2011-12-13 ENCOUNTER — Other Ambulatory Visit: Payer: Self-pay | Admitting: Diagnostic Neuroimaging

## 2011-12-13 DIAGNOSIS — N186 End stage renal disease: Secondary | ICD-10-CM | POA: Diagnosis not present

## 2011-12-13 DIAGNOSIS — R519 Headache, unspecified: Secondary | ICD-10-CM

## 2011-12-14 DIAGNOSIS — N186 End stage renal disease: Secondary | ICD-10-CM | POA: Diagnosis not present

## 2011-12-14 DIAGNOSIS — D631 Anemia in chronic kidney disease: Secondary | ICD-10-CM | POA: Diagnosis not present

## 2011-12-14 DIAGNOSIS — N039 Chronic nephritic syndrome with unspecified morphologic changes: Secondary | ICD-10-CM | POA: Diagnosis not present

## 2011-12-14 DIAGNOSIS — E875 Hyperkalemia: Secondary | ICD-10-CM | POA: Diagnosis not present

## 2011-12-14 DIAGNOSIS — D509 Iron deficiency anemia, unspecified: Secondary | ICD-10-CM | POA: Diagnosis not present

## 2011-12-14 DIAGNOSIS — N2581 Secondary hyperparathyroidism of renal origin: Secondary | ICD-10-CM | POA: Diagnosis not present

## 2011-12-17 DIAGNOSIS — D509 Iron deficiency anemia, unspecified: Secondary | ICD-10-CM | POA: Diagnosis not present

## 2011-12-17 DIAGNOSIS — N186 End stage renal disease: Secondary | ICD-10-CM | POA: Diagnosis not present

## 2011-12-17 DIAGNOSIS — D631 Anemia in chronic kidney disease: Secondary | ICD-10-CM | POA: Diagnosis not present

## 2011-12-17 DIAGNOSIS — E875 Hyperkalemia: Secondary | ICD-10-CM | POA: Diagnosis not present

## 2011-12-17 DIAGNOSIS — N2581 Secondary hyperparathyroidism of renal origin: Secondary | ICD-10-CM | POA: Diagnosis not present

## 2011-12-18 ENCOUNTER — Ambulatory Visit
Admission: RE | Admit: 2011-12-18 | Discharge: 2011-12-18 | Disposition: A | Discharge status: Home / Self Care | Payer: Medicare Other | Source: Ambulatory Visit | Attending: Diagnostic Neuroimaging | Admitting: Diagnostic Neuroimaging

## 2011-12-18 DIAGNOSIS — G9389 Other specified disorders of brain: Secondary | ICD-10-CM | POA: Diagnosis not present

## 2011-12-18 DIAGNOSIS — R519 Headache, unspecified: Secondary | ICD-10-CM

## 2011-12-18 MED ORDER — IOHEXOL 350 MG/ML SOLN
100.0000 mL | Freq: Once | INTRAVENOUS | Status: AC | PRN
  Administered 2011-12-18: 100 mL via INTRAVENOUS

## 2011-12-19 DIAGNOSIS — E875 Hyperkalemia: Secondary | ICD-10-CM | POA: Diagnosis not present

## 2011-12-19 DIAGNOSIS — N039 Chronic nephritic syndrome with unspecified morphologic changes: Secondary | ICD-10-CM | POA: Diagnosis not present

## 2011-12-19 DIAGNOSIS — D631 Anemia in chronic kidney disease: Secondary | ICD-10-CM | POA: Diagnosis not present

## 2011-12-19 DIAGNOSIS — R51 Headache: Secondary | ICD-10-CM | POA: Diagnosis not present

## 2011-12-19 DIAGNOSIS — D509 Iron deficiency anemia, unspecified: Secondary | ICD-10-CM | POA: Diagnosis not present

## 2011-12-19 DIAGNOSIS — N2581 Secondary hyperparathyroidism of renal origin: Secondary | ICD-10-CM | POA: Diagnosis not present

## 2011-12-19 DIAGNOSIS — N186 End stage renal disease: Secondary | ICD-10-CM | POA: Diagnosis not present

## 2011-12-20 ENCOUNTER — Ambulatory Visit
Admission: RE | Admit: 2011-12-20 | Discharge: 2011-12-20 | Disposition: A | Discharge status: Home / Self Care | Payer: Medicare Other | Source: Ambulatory Visit | Attending: Diagnostic Neuroimaging | Admitting: Diagnostic Neuroimaging

## 2011-12-20 DIAGNOSIS — R51 Headache: Secondary | ICD-10-CM | POA: Diagnosis not present

## 2011-12-20 DIAGNOSIS — R519 Headache, unspecified: Secondary | ICD-10-CM

## 2011-12-21 DIAGNOSIS — D509 Iron deficiency anemia, unspecified: Secondary | ICD-10-CM | POA: Diagnosis not present

## 2011-12-21 DIAGNOSIS — N186 End stage renal disease: Secondary | ICD-10-CM | POA: Diagnosis not present

## 2011-12-21 DIAGNOSIS — E875 Hyperkalemia: Secondary | ICD-10-CM | POA: Diagnosis not present

## 2011-12-21 DIAGNOSIS — N039 Chronic nephritic syndrome with unspecified morphologic changes: Secondary | ICD-10-CM | POA: Diagnosis not present

## 2011-12-21 DIAGNOSIS — N2581 Secondary hyperparathyroidism of renal origin: Secondary | ICD-10-CM | POA: Diagnosis not present

## 2011-12-21 DIAGNOSIS — D631 Anemia in chronic kidney disease: Secondary | ICD-10-CM | POA: Diagnosis not present

## 2011-12-24 DIAGNOSIS — N186 End stage renal disease: Secondary | ICD-10-CM | POA: Diagnosis not present

## 2011-12-24 DIAGNOSIS — D631 Anemia in chronic kidney disease: Secondary | ICD-10-CM | POA: Diagnosis not present

## 2011-12-24 DIAGNOSIS — E875 Hyperkalemia: Secondary | ICD-10-CM | POA: Diagnosis not present

## 2011-12-24 DIAGNOSIS — D509 Iron deficiency anemia, unspecified: Secondary | ICD-10-CM | POA: Diagnosis not present

## 2011-12-24 DIAGNOSIS — N2581 Secondary hyperparathyroidism of renal origin: Secondary | ICD-10-CM | POA: Diagnosis not present

## 2011-12-26 DIAGNOSIS — N186 End stage renal disease: Secondary | ICD-10-CM | POA: Diagnosis not present

## 2011-12-26 DIAGNOSIS — D509 Iron deficiency anemia, unspecified: Secondary | ICD-10-CM | POA: Diagnosis not present

## 2011-12-26 DIAGNOSIS — N039 Chronic nephritic syndrome with unspecified morphologic changes: Secondary | ICD-10-CM | POA: Diagnosis not present

## 2011-12-26 DIAGNOSIS — E875 Hyperkalemia: Secondary | ICD-10-CM | POA: Diagnosis not present

## 2011-12-26 DIAGNOSIS — N2581 Secondary hyperparathyroidism of renal origin: Secondary | ICD-10-CM | POA: Diagnosis not present

## 2011-12-26 DIAGNOSIS — D631 Anemia in chronic kidney disease: Secondary | ICD-10-CM | POA: Diagnosis not present

## 2011-12-28 DIAGNOSIS — N186 End stage renal disease: Secondary | ICD-10-CM | POA: Diagnosis not present

## 2011-12-28 DIAGNOSIS — D631 Anemia in chronic kidney disease: Secondary | ICD-10-CM | POA: Diagnosis not present

## 2011-12-28 DIAGNOSIS — D509 Iron deficiency anemia, unspecified: Secondary | ICD-10-CM | POA: Diagnosis not present

## 2011-12-28 DIAGNOSIS — E875 Hyperkalemia: Secondary | ICD-10-CM | POA: Diagnosis not present

## 2011-12-28 DIAGNOSIS — N2581 Secondary hyperparathyroidism of renal origin: Secondary | ICD-10-CM | POA: Diagnosis not present

## 2011-12-31 DIAGNOSIS — N186 End stage renal disease: Secondary | ICD-10-CM | POA: Diagnosis not present

## 2011-12-31 DIAGNOSIS — N2581 Secondary hyperparathyroidism of renal origin: Secondary | ICD-10-CM | POA: Diagnosis not present

## 2011-12-31 DIAGNOSIS — D509 Iron deficiency anemia, unspecified: Secondary | ICD-10-CM | POA: Diagnosis not present

## 2011-12-31 DIAGNOSIS — D631 Anemia in chronic kidney disease: Secondary | ICD-10-CM | POA: Diagnosis not present

## 2011-12-31 DIAGNOSIS — N039 Chronic nephritic syndrome with unspecified morphologic changes: Secondary | ICD-10-CM | POA: Diagnosis not present

## 2011-12-31 DIAGNOSIS — E875 Hyperkalemia: Secondary | ICD-10-CM | POA: Diagnosis not present

## 2012-01-02 DIAGNOSIS — N2581 Secondary hyperparathyroidism of renal origin: Secondary | ICD-10-CM | POA: Diagnosis not present

## 2012-01-02 DIAGNOSIS — D509 Iron deficiency anemia, unspecified: Secondary | ICD-10-CM | POA: Diagnosis not present

## 2012-01-02 DIAGNOSIS — D631 Anemia in chronic kidney disease: Secondary | ICD-10-CM | POA: Diagnosis not present

## 2012-01-02 DIAGNOSIS — N186 End stage renal disease: Secondary | ICD-10-CM | POA: Diagnosis not present

## 2012-01-02 DIAGNOSIS — E875 Hyperkalemia: Secondary | ICD-10-CM | POA: Diagnosis not present

## 2012-01-02 DIAGNOSIS — N039 Chronic nephritic syndrome with unspecified morphologic changes: Secondary | ICD-10-CM | POA: Diagnosis not present

## 2012-01-04 DIAGNOSIS — E875 Hyperkalemia: Secondary | ICD-10-CM | POA: Diagnosis not present

## 2012-01-04 DIAGNOSIS — N2581 Secondary hyperparathyroidism of renal origin: Secondary | ICD-10-CM | POA: Diagnosis not present

## 2012-01-04 DIAGNOSIS — D631 Anemia in chronic kidney disease: Secondary | ICD-10-CM | POA: Diagnosis not present

## 2012-01-04 DIAGNOSIS — N186 End stage renal disease: Secondary | ICD-10-CM | POA: Diagnosis not present

## 2012-01-04 DIAGNOSIS — D509 Iron deficiency anemia, unspecified: Secondary | ICD-10-CM | POA: Diagnosis not present

## 2012-01-07 DIAGNOSIS — D631 Anemia in chronic kidney disease: Secondary | ICD-10-CM | POA: Diagnosis not present

## 2012-01-07 DIAGNOSIS — D509 Iron deficiency anemia, unspecified: Secondary | ICD-10-CM | POA: Diagnosis not present

## 2012-01-07 DIAGNOSIS — E875 Hyperkalemia: Secondary | ICD-10-CM | POA: Diagnosis not present

## 2012-01-07 DIAGNOSIS — N2581 Secondary hyperparathyroidism of renal origin: Secondary | ICD-10-CM | POA: Diagnosis not present

## 2012-01-07 DIAGNOSIS — N186 End stage renal disease: Secondary | ICD-10-CM | POA: Diagnosis not present

## 2012-01-09 DIAGNOSIS — N186 End stage renal disease: Secondary | ICD-10-CM | POA: Diagnosis not present

## 2012-01-09 DIAGNOSIS — E875 Hyperkalemia: Secondary | ICD-10-CM | POA: Diagnosis not present

## 2012-01-09 DIAGNOSIS — N2581 Secondary hyperparathyroidism of renal origin: Secondary | ICD-10-CM | POA: Diagnosis not present

## 2012-01-09 DIAGNOSIS — D509 Iron deficiency anemia, unspecified: Secondary | ICD-10-CM | POA: Diagnosis not present

## 2012-01-09 DIAGNOSIS — D631 Anemia in chronic kidney disease: Secondary | ICD-10-CM | POA: Diagnosis not present

## 2012-01-11 DIAGNOSIS — D631 Anemia in chronic kidney disease: Secondary | ICD-10-CM | POA: Diagnosis not present

## 2012-01-11 DIAGNOSIS — D509 Iron deficiency anemia, unspecified: Secondary | ICD-10-CM | POA: Diagnosis not present

## 2012-01-11 DIAGNOSIS — N186 End stage renal disease: Secondary | ICD-10-CM | POA: Diagnosis not present

## 2012-01-11 DIAGNOSIS — N2581 Secondary hyperparathyroidism of renal origin: Secondary | ICD-10-CM | POA: Diagnosis not present

## 2012-01-11 DIAGNOSIS — E875 Hyperkalemia: Secondary | ICD-10-CM | POA: Diagnosis not present

## 2012-01-12 DIAGNOSIS — N186 End stage renal disease: Secondary | ICD-10-CM | POA: Diagnosis not present

## 2012-01-14 DIAGNOSIS — N186 End stage renal disease: Secondary | ICD-10-CM | POA: Diagnosis not present

## 2012-01-14 DIAGNOSIS — D631 Anemia in chronic kidney disease: Secondary | ICD-10-CM | POA: Diagnosis not present

## 2012-01-14 DIAGNOSIS — N039 Chronic nephritic syndrome with unspecified morphologic changes: Secondary | ICD-10-CM | POA: Diagnosis not present

## 2012-01-14 DIAGNOSIS — D539 Nutritional anemia, unspecified: Secondary | ICD-10-CM | POA: Diagnosis not present

## 2012-01-14 DIAGNOSIS — D509 Iron deficiency anemia, unspecified: Secondary | ICD-10-CM | POA: Diagnosis not present

## 2012-01-14 DIAGNOSIS — E875 Hyperkalemia: Secondary | ICD-10-CM | POA: Diagnosis not present

## 2012-01-14 DIAGNOSIS — N2581 Secondary hyperparathyroidism of renal origin: Secondary | ICD-10-CM | POA: Diagnosis not present

## 2012-01-16 DIAGNOSIS — D631 Anemia in chronic kidney disease: Secondary | ICD-10-CM | POA: Diagnosis not present

## 2012-01-16 DIAGNOSIS — N186 End stage renal disease: Secondary | ICD-10-CM | POA: Diagnosis not present

## 2012-01-16 DIAGNOSIS — D509 Iron deficiency anemia, unspecified: Secondary | ICD-10-CM | POA: Diagnosis not present

## 2012-01-16 DIAGNOSIS — D539 Nutritional anemia, unspecified: Secondary | ICD-10-CM | POA: Diagnosis not present

## 2012-01-16 DIAGNOSIS — E875 Hyperkalemia: Secondary | ICD-10-CM | POA: Diagnosis not present

## 2012-01-16 DIAGNOSIS — N2581 Secondary hyperparathyroidism of renal origin: Secondary | ICD-10-CM | POA: Diagnosis not present

## 2012-01-18 DIAGNOSIS — N2581 Secondary hyperparathyroidism of renal origin: Secondary | ICD-10-CM | POA: Diagnosis not present

## 2012-01-18 DIAGNOSIS — N186 End stage renal disease: Secondary | ICD-10-CM | POA: Diagnosis not present

## 2012-01-18 DIAGNOSIS — D539 Nutritional anemia, unspecified: Secondary | ICD-10-CM | POA: Diagnosis not present

## 2012-01-18 DIAGNOSIS — D509 Iron deficiency anemia, unspecified: Secondary | ICD-10-CM | POA: Diagnosis not present

## 2012-01-18 DIAGNOSIS — E875 Hyperkalemia: Secondary | ICD-10-CM | POA: Diagnosis not present

## 2012-01-18 DIAGNOSIS — D631 Anemia in chronic kidney disease: Secondary | ICD-10-CM | POA: Diagnosis not present

## 2012-01-21 DIAGNOSIS — E875 Hyperkalemia: Secondary | ICD-10-CM | POA: Diagnosis not present

## 2012-01-21 DIAGNOSIS — D509 Iron deficiency anemia, unspecified: Secondary | ICD-10-CM | POA: Diagnosis not present

## 2012-01-21 DIAGNOSIS — N2581 Secondary hyperparathyroidism of renal origin: Secondary | ICD-10-CM | POA: Diagnosis not present

## 2012-01-21 DIAGNOSIS — D631 Anemia in chronic kidney disease: Secondary | ICD-10-CM | POA: Diagnosis not present

## 2012-01-21 DIAGNOSIS — D539 Nutritional anemia, unspecified: Secondary | ICD-10-CM | POA: Diagnosis not present

## 2012-01-21 DIAGNOSIS — N186 End stage renal disease: Secondary | ICD-10-CM | POA: Diagnosis not present

## 2012-01-23 DIAGNOSIS — D509 Iron deficiency anemia, unspecified: Secondary | ICD-10-CM | POA: Diagnosis not present

## 2012-01-23 DIAGNOSIS — N186 End stage renal disease: Secondary | ICD-10-CM | POA: Diagnosis not present

## 2012-01-23 DIAGNOSIS — E875 Hyperkalemia: Secondary | ICD-10-CM | POA: Diagnosis not present

## 2012-01-23 DIAGNOSIS — N039 Chronic nephritic syndrome with unspecified morphologic changes: Secondary | ICD-10-CM | POA: Diagnosis not present

## 2012-01-23 DIAGNOSIS — N2581 Secondary hyperparathyroidism of renal origin: Secondary | ICD-10-CM | POA: Diagnosis not present

## 2012-01-23 DIAGNOSIS — D631 Anemia in chronic kidney disease: Secondary | ICD-10-CM | POA: Diagnosis not present

## 2012-01-23 DIAGNOSIS — D539 Nutritional anemia, unspecified: Secondary | ICD-10-CM | POA: Diagnosis not present

## 2012-01-25 DIAGNOSIS — N2581 Secondary hyperparathyroidism of renal origin: Secondary | ICD-10-CM | POA: Diagnosis not present

## 2012-01-25 DIAGNOSIS — D539 Nutritional anemia, unspecified: Secondary | ICD-10-CM | POA: Diagnosis not present

## 2012-01-25 DIAGNOSIS — E875 Hyperkalemia: Secondary | ICD-10-CM | POA: Diagnosis not present

## 2012-01-25 DIAGNOSIS — N186 End stage renal disease: Secondary | ICD-10-CM | POA: Diagnosis not present

## 2012-01-25 DIAGNOSIS — D631 Anemia in chronic kidney disease: Secondary | ICD-10-CM | POA: Diagnosis not present

## 2012-01-25 DIAGNOSIS — D509 Iron deficiency anemia, unspecified: Secondary | ICD-10-CM | POA: Diagnosis not present

## 2012-01-28 DIAGNOSIS — N039 Chronic nephritic syndrome with unspecified morphologic changes: Secondary | ICD-10-CM | POA: Diagnosis not present

## 2012-01-28 DIAGNOSIS — D509 Iron deficiency anemia, unspecified: Secondary | ICD-10-CM | POA: Diagnosis not present

## 2012-01-28 DIAGNOSIS — D539 Nutritional anemia, unspecified: Secondary | ICD-10-CM | POA: Diagnosis not present

## 2012-01-28 DIAGNOSIS — N186 End stage renal disease: Secondary | ICD-10-CM | POA: Diagnosis not present

## 2012-01-28 DIAGNOSIS — D631 Anemia in chronic kidney disease: Secondary | ICD-10-CM | POA: Diagnosis not present

## 2012-01-28 DIAGNOSIS — E875 Hyperkalemia: Secondary | ICD-10-CM | POA: Diagnosis not present

## 2012-01-28 DIAGNOSIS — N2581 Secondary hyperparathyroidism of renal origin: Secondary | ICD-10-CM | POA: Diagnosis not present

## 2012-01-30 DIAGNOSIS — D631 Anemia in chronic kidney disease: Secondary | ICD-10-CM | POA: Diagnosis not present

## 2012-01-30 DIAGNOSIS — N186 End stage renal disease: Secondary | ICD-10-CM | POA: Diagnosis not present

## 2012-01-30 DIAGNOSIS — N2581 Secondary hyperparathyroidism of renal origin: Secondary | ICD-10-CM | POA: Diagnosis not present

## 2012-01-30 DIAGNOSIS — N039 Chronic nephritic syndrome with unspecified morphologic changes: Secondary | ICD-10-CM | POA: Diagnosis not present

## 2012-01-30 DIAGNOSIS — D509 Iron deficiency anemia, unspecified: Secondary | ICD-10-CM | POA: Diagnosis not present

## 2012-01-30 DIAGNOSIS — E875 Hyperkalemia: Secondary | ICD-10-CM | POA: Diagnosis not present

## 2012-01-30 DIAGNOSIS — D539 Nutritional anemia, unspecified: Secondary | ICD-10-CM | POA: Diagnosis not present

## 2012-02-01 DIAGNOSIS — N186 End stage renal disease: Secondary | ICD-10-CM | POA: Diagnosis not present

## 2012-02-01 DIAGNOSIS — D631 Anemia in chronic kidney disease: Secondary | ICD-10-CM | POA: Diagnosis not present

## 2012-02-01 DIAGNOSIS — D509 Iron deficiency anemia, unspecified: Secondary | ICD-10-CM | POA: Diagnosis not present

## 2012-02-01 DIAGNOSIS — D539 Nutritional anemia, unspecified: Secondary | ICD-10-CM | POA: Diagnosis not present

## 2012-02-01 DIAGNOSIS — E875 Hyperkalemia: Secondary | ICD-10-CM | POA: Diagnosis not present

## 2012-02-01 DIAGNOSIS — N2581 Secondary hyperparathyroidism of renal origin: Secondary | ICD-10-CM | POA: Diagnosis not present

## 2012-02-03 DIAGNOSIS — D631 Anemia in chronic kidney disease: Secondary | ICD-10-CM | POA: Diagnosis not present

## 2012-02-03 DIAGNOSIS — E875 Hyperkalemia: Secondary | ICD-10-CM | POA: Diagnosis not present

## 2012-02-03 DIAGNOSIS — N186 End stage renal disease: Secondary | ICD-10-CM | POA: Diagnosis not present

## 2012-02-03 DIAGNOSIS — D509 Iron deficiency anemia, unspecified: Secondary | ICD-10-CM | POA: Diagnosis not present

## 2012-02-03 DIAGNOSIS — N2581 Secondary hyperparathyroidism of renal origin: Secondary | ICD-10-CM | POA: Diagnosis not present

## 2012-02-03 DIAGNOSIS — N039 Chronic nephritic syndrome with unspecified morphologic changes: Secondary | ICD-10-CM | POA: Diagnosis not present

## 2012-02-03 DIAGNOSIS — D539 Nutritional anemia, unspecified: Secondary | ICD-10-CM | POA: Diagnosis not present

## 2012-02-05 DIAGNOSIS — D509 Iron deficiency anemia, unspecified: Secondary | ICD-10-CM | POA: Diagnosis not present

## 2012-02-05 DIAGNOSIS — D631 Anemia in chronic kidney disease: Secondary | ICD-10-CM | POA: Diagnosis not present

## 2012-02-05 DIAGNOSIS — N039 Chronic nephritic syndrome with unspecified morphologic changes: Secondary | ICD-10-CM | POA: Diagnosis not present

## 2012-02-05 DIAGNOSIS — E875 Hyperkalemia: Secondary | ICD-10-CM | POA: Diagnosis not present

## 2012-02-05 DIAGNOSIS — D539 Nutritional anemia, unspecified: Secondary | ICD-10-CM | POA: Diagnosis not present

## 2012-02-05 DIAGNOSIS — N2581 Secondary hyperparathyroidism of renal origin: Secondary | ICD-10-CM | POA: Diagnosis not present

## 2012-02-05 DIAGNOSIS — N186 End stage renal disease: Secondary | ICD-10-CM | POA: Diagnosis not present

## 2012-02-08 DIAGNOSIS — D631 Anemia in chronic kidney disease: Secondary | ICD-10-CM | POA: Diagnosis not present

## 2012-02-08 DIAGNOSIS — E875 Hyperkalemia: Secondary | ICD-10-CM | POA: Diagnosis not present

## 2012-02-08 DIAGNOSIS — N186 End stage renal disease: Secondary | ICD-10-CM | POA: Diagnosis not present

## 2012-02-08 DIAGNOSIS — N2581 Secondary hyperparathyroidism of renal origin: Secondary | ICD-10-CM | POA: Diagnosis not present

## 2012-02-08 DIAGNOSIS — D539 Nutritional anemia, unspecified: Secondary | ICD-10-CM | POA: Diagnosis not present

## 2012-02-08 DIAGNOSIS — D509 Iron deficiency anemia, unspecified: Secondary | ICD-10-CM | POA: Diagnosis not present

## 2012-02-10 DIAGNOSIS — D539 Nutritional anemia, unspecified: Secondary | ICD-10-CM | POA: Diagnosis not present

## 2012-02-10 DIAGNOSIS — D631 Anemia in chronic kidney disease: Secondary | ICD-10-CM | POA: Diagnosis not present

## 2012-02-10 DIAGNOSIS — N039 Chronic nephritic syndrome with unspecified morphologic changes: Secondary | ICD-10-CM | POA: Diagnosis not present

## 2012-02-10 DIAGNOSIS — N2581 Secondary hyperparathyroidism of renal origin: Secondary | ICD-10-CM | POA: Diagnosis not present

## 2012-02-10 DIAGNOSIS — D509 Iron deficiency anemia, unspecified: Secondary | ICD-10-CM | POA: Diagnosis not present

## 2012-02-10 DIAGNOSIS — E875 Hyperkalemia: Secondary | ICD-10-CM | POA: Diagnosis not present

## 2012-02-10 DIAGNOSIS — N186 End stage renal disease: Secondary | ICD-10-CM | POA: Diagnosis not present

## 2012-02-12 DIAGNOSIS — D509 Iron deficiency anemia, unspecified: Secondary | ICD-10-CM | POA: Diagnosis not present

## 2012-02-12 DIAGNOSIS — D539 Nutritional anemia, unspecified: Secondary | ICD-10-CM | POA: Diagnosis not present

## 2012-02-12 DIAGNOSIS — E875 Hyperkalemia: Secondary | ICD-10-CM | POA: Diagnosis not present

## 2012-02-12 DIAGNOSIS — N2581 Secondary hyperparathyroidism of renal origin: Secondary | ICD-10-CM | POA: Diagnosis not present

## 2012-02-12 DIAGNOSIS — N039 Chronic nephritic syndrome with unspecified morphologic changes: Secondary | ICD-10-CM | POA: Diagnosis not present

## 2012-02-12 DIAGNOSIS — D631 Anemia in chronic kidney disease: Secondary | ICD-10-CM | POA: Diagnosis not present

## 2012-02-12 DIAGNOSIS — N186 End stage renal disease: Secondary | ICD-10-CM | POA: Diagnosis not present

## 2012-02-15 DIAGNOSIS — E875 Hyperkalemia: Secondary | ICD-10-CM | POA: Diagnosis not present

## 2012-02-15 DIAGNOSIS — N2581 Secondary hyperparathyroidism of renal origin: Secondary | ICD-10-CM | POA: Diagnosis not present

## 2012-02-15 DIAGNOSIS — N039 Chronic nephritic syndrome with unspecified morphologic changes: Secondary | ICD-10-CM | POA: Diagnosis not present

## 2012-02-15 DIAGNOSIS — D631 Anemia in chronic kidney disease: Secondary | ICD-10-CM | POA: Diagnosis not present

## 2012-02-15 DIAGNOSIS — N186 End stage renal disease: Secondary | ICD-10-CM | POA: Diagnosis not present

## 2012-02-15 DIAGNOSIS — D509 Iron deficiency anemia, unspecified: Secondary | ICD-10-CM | POA: Diagnosis not present

## 2012-02-15 DIAGNOSIS — Z992 Dependence on renal dialysis: Secondary | ICD-10-CM | POA: Diagnosis not present

## 2012-02-18 DIAGNOSIS — N039 Chronic nephritic syndrome with unspecified morphologic changes: Secondary | ICD-10-CM | POA: Diagnosis not present

## 2012-02-18 DIAGNOSIS — E875 Hyperkalemia: Secondary | ICD-10-CM | POA: Diagnosis not present

## 2012-02-18 DIAGNOSIS — N2581 Secondary hyperparathyroidism of renal origin: Secondary | ICD-10-CM | POA: Diagnosis not present

## 2012-02-18 DIAGNOSIS — Z992 Dependence on renal dialysis: Secondary | ICD-10-CM | POA: Diagnosis not present

## 2012-02-18 DIAGNOSIS — D509 Iron deficiency anemia, unspecified: Secondary | ICD-10-CM | POA: Diagnosis not present

## 2012-02-18 DIAGNOSIS — N186 End stage renal disease: Secondary | ICD-10-CM | POA: Diagnosis not present

## 2012-02-18 DIAGNOSIS — D631 Anemia in chronic kidney disease: Secondary | ICD-10-CM | POA: Diagnosis not present

## 2012-02-20 DIAGNOSIS — Z992 Dependence on renal dialysis: Secondary | ICD-10-CM | POA: Diagnosis not present

## 2012-02-20 DIAGNOSIS — D509 Iron deficiency anemia, unspecified: Secondary | ICD-10-CM | POA: Diagnosis not present

## 2012-02-20 DIAGNOSIS — D631 Anemia in chronic kidney disease: Secondary | ICD-10-CM | POA: Diagnosis not present

## 2012-02-20 DIAGNOSIS — N2581 Secondary hyperparathyroidism of renal origin: Secondary | ICD-10-CM | POA: Diagnosis not present

## 2012-02-20 DIAGNOSIS — N186 End stage renal disease: Secondary | ICD-10-CM | POA: Diagnosis not present

## 2012-02-20 DIAGNOSIS — E875 Hyperkalemia: Secondary | ICD-10-CM | POA: Diagnosis not present

## 2012-02-22 DIAGNOSIS — N2581 Secondary hyperparathyroidism of renal origin: Secondary | ICD-10-CM | POA: Diagnosis not present

## 2012-02-22 DIAGNOSIS — D509 Iron deficiency anemia, unspecified: Secondary | ICD-10-CM | POA: Diagnosis not present

## 2012-02-22 DIAGNOSIS — E875 Hyperkalemia: Secondary | ICD-10-CM | POA: Diagnosis not present

## 2012-02-22 DIAGNOSIS — Z992 Dependence on renal dialysis: Secondary | ICD-10-CM | POA: Diagnosis not present

## 2012-02-22 DIAGNOSIS — D631 Anemia in chronic kidney disease: Secondary | ICD-10-CM | POA: Diagnosis not present

## 2012-02-22 DIAGNOSIS — N186 End stage renal disease: Secondary | ICD-10-CM | POA: Diagnosis not present

## 2012-02-22 DIAGNOSIS — N039 Chronic nephritic syndrome with unspecified morphologic changes: Secondary | ICD-10-CM | POA: Diagnosis not present

## 2012-02-25 DIAGNOSIS — N186 End stage renal disease: Secondary | ICD-10-CM | POA: Diagnosis not present

## 2012-02-25 DIAGNOSIS — D631 Anemia in chronic kidney disease: Secondary | ICD-10-CM | POA: Diagnosis not present

## 2012-02-25 DIAGNOSIS — D509 Iron deficiency anemia, unspecified: Secondary | ICD-10-CM | POA: Diagnosis not present

## 2012-02-25 DIAGNOSIS — E875 Hyperkalemia: Secondary | ICD-10-CM | POA: Diagnosis not present

## 2012-02-25 DIAGNOSIS — Z992 Dependence on renal dialysis: Secondary | ICD-10-CM | POA: Diagnosis not present

## 2012-02-25 DIAGNOSIS — N039 Chronic nephritic syndrome with unspecified morphologic changes: Secondary | ICD-10-CM | POA: Diagnosis not present

## 2012-02-25 DIAGNOSIS — N2581 Secondary hyperparathyroidism of renal origin: Secondary | ICD-10-CM | POA: Diagnosis not present

## 2012-02-27 DIAGNOSIS — D509 Iron deficiency anemia, unspecified: Secondary | ICD-10-CM | POA: Diagnosis not present

## 2012-02-27 DIAGNOSIS — Z992 Dependence on renal dialysis: Secondary | ICD-10-CM | POA: Diagnosis not present

## 2012-02-27 DIAGNOSIS — N186 End stage renal disease: Secondary | ICD-10-CM | POA: Diagnosis not present

## 2012-02-27 DIAGNOSIS — E875 Hyperkalemia: Secondary | ICD-10-CM | POA: Diagnosis not present

## 2012-02-27 DIAGNOSIS — D631 Anemia in chronic kidney disease: Secondary | ICD-10-CM | POA: Diagnosis not present

## 2012-02-27 DIAGNOSIS — N2581 Secondary hyperparathyroidism of renal origin: Secondary | ICD-10-CM | POA: Diagnosis not present

## 2012-02-29 DIAGNOSIS — N186 End stage renal disease: Secondary | ICD-10-CM | POA: Diagnosis not present

## 2012-02-29 DIAGNOSIS — E875 Hyperkalemia: Secondary | ICD-10-CM | POA: Diagnosis not present

## 2012-02-29 DIAGNOSIS — Z992 Dependence on renal dialysis: Secondary | ICD-10-CM | POA: Diagnosis not present

## 2012-02-29 DIAGNOSIS — D631 Anemia in chronic kidney disease: Secondary | ICD-10-CM | POA: Diagnosis not present

## 2012-02-29 DIAGNOSIS — N2581 Secondary hyperparathyroidism of renal origin: Secondary | ICD-10-CM | POA: Diagnosis not present

## 2012-02-29 DIAGNOSIS — D509 Iron deficiency anemia, unspecified: Secondary | ICD-10-CM | POA: Diagnosis not present

## 2012-03-03 DIAGNOSIS — Z992 Dependence on renal dialysis: Secondary | ICD-10-CM | POA: Diagnosis not present

## 2012-03-03 DIAGNOSIS — N186 End stage renal disease: Secondary | ICD-10-CM | POA: Diagnosis not present

## 2012-03-03 DIAGNOSIS — D509 Iron deficiency anemia, unspecified: Secondary | ICD-10-CM | POA: Diagnosis not present

## 2012-03-03 DIAGNOSIS — D631 Anemia in chronic kidney disease: Secondary | ICD-10-CM | POA: Diagnosis not present

## 2012-03-03 DIAGNOSIS — N2581 Secondary hyperparathyroidism of renal origin: Secondary | ICD-10-CM | POA: Diagnosis not present

## 2012-03-03 DIAGNOSIS — E875 Hyperkalemia: Secondary | ICD-10-CM | POA: Diagnosis not present

## 2012-03-05 DIAGNOSIS — Z992 Dependence on renal dialysis: Secondary | ICD-10-CM | POA: Diagnosis not present

## 2012-03-05 DIAGNOSIS — N2581 Secondary hyperparathyroidism of renal origin: Secondary | ICD-10-CM | POA: Diagnosis not present

## 2012-03-05 DIAGNOSIS — D509 Iron deficiency anemia, unspecified: Secondary | ICD-10-CM | POA: Diagnosis not present

## 2012-03-05 DIAGNOSIS — D631 Anemia in chronic kidney disease: Secondary | ICD-10-CM | POA: Diagnosis not present

## 2012-03-05 DIAGNOSIS — E875 Hyperkalemia: Secondary | ICD-10-CM | POA: Diagnosis not present

## 2012-03-05 DIAGNOSIS — N186 End stage renal disease: Secondary | ICD-10-CM | POA: Diagnosis not present

## 2012-03-07 DIAGNOSIS — N2581 Secondary hyperparathyroidism of renal origin: Secondary | ICD-10-CM | POA: Diagnosis not present

## 2012-03-07 DIAGNOSIS — Z992 Dependence on renal dialysis: Secondary | ICD-10-CM | POA: Diagnosis not present

## 2012-03-07 DIAGNOSIS — D631 Anemia in chronic kidney disease: Secondary | ICD-10-CM | POA: Diagnosis not present

## 2012-03-07 DIAGNOSIS — D509 Iron deficiency anemia, unspecified: Secondary | ICD-10-CM | POA: Diagnosis not present

## 2012-03-07 DIAGNOSIS — N186 End stage renal disease: Secondary | ICD-10-CM | POA: Diagnosis not present

## 2012-03-07 DIAGNOSIS — E875 Hyperkalemia: Secondary | ICD-10-CM | POA: Diagnosis not present

## 2012-03-07 DIAGNOSIS — N039 Chronic nephritic syndrome with unspecified morphologic changes: Secondary | ICD-10-CM | POA: Diagnosis not present

## 2012-03-10 DIAGNOSIS — N186 End stage renal disease: Secondary | ICD-10-CM | POA: Diagnosis not present

## 2012-03-10 DIAGNOSIS — D509 Iron deficiency anemia, unspecified: Secondary | ICD-10-CM | POA: Diagnosis not present

## 2012-03-10 DIAGNOSIS — E875 Hyperkalemia: Secondary | ICD-10-CM | POA: Diagnosis not present

## 2012-03-10 DIAGNOSIS — Z992 Dependence on renal dialysis: Secondary | ICD-10-CM | POA: Diagnosis not present

## 2012-03-10 DIAGNOSIS — N2581 Secondary hyperparathyroidism of renal origin: Secondary | ICD-10-CM | POA: Diagnosis not present

## 2012-03-10 DIAGNOSIS — N039 Chronic nephritic syndrome with unspecified morphologic changes: Secondary | ICD-10-CM | POA: Diagnosis not present

## 2012-03-10 DIAGNOSIS — D631 Anemia in chronic kidney disease: Secondary | ICD-10-CM | POA: Diagnosis not present

## 2012-03-12 DIAGNOSIS — N186 End stage renal disease: Secondary | ICD-10-CM | POA: Diagnosis not present

## 2012-03-12 DIAGNOSIS — E1129 Type 2 diabetes mellitus with other diabetic kidney complication: Secondary | ICD-10-CM | POA: Diagnosis not present

## 2012-03-12 DIAGNOSIS — E875 Hyperkalemia: Secondary | ICD-10-CM | POA: Diagnosis not present

## 2012-03-12 DIAGNOSIS — D509 Iron deficiency anemia, unspecified: Secondary | ICD-10-CM | POA: Diagnosis not present

## 2012-03-12 DIAGNOSIS — N2581 Secondary hyperparathyroidism of renal origin: Secondary | ICD-10-CM | POA: Diagnosis not present

## 2012-03-12 DIAGNOSIS — D631 Anemia in chronic kidney disease: Secondary | ICD-10-CM | POA: Diagnosis not present

## 2012-03-12 DIAGNOSIS — Z992 Dependence on renal dialysis: Secondary | ICD-10-CM | POA: Diagnosis not present

## 2012-03-14 DIAGNOSIS — N2581 Secondary hyperparathyroidism of renal origin: Secondary | ICD-10-CM | POA: Diagnosis not present

## 2012-03-14 DIAGNOSIS — Z992 Dependence on renal dialysis: Secondary | ICD-10-CM | POA: Diagnosis not present

## 2012-03-14 DIAGNOSIS — N186 End stage renal disease: Secondary | ICD-10-CM | POA: Diagnosis not present

## 2012-03-14 DIAGNOSIS — D631 Anemia in chronic kidney disease: Secondary | ICD-10-CM | POA: Diagnosis not present

## 2012-03-14 DIAGNOSIS — E875 Hyperkalemia: Secondary | ICD-10-CM | POA: Diagnosis not present

## 2012-03-14 DIAGNOSIS — D509 Iron deficiency anemia, unspecified: Secondary | ICD-10-CM | POA: Diagnosis not present

## 2012-03-17 DIAGNOSIS — N186 End stage renal disease: Secondary | ICD-10-CM | POA: Diagnosis not present

## 2012-03-17 DIAGNOSIS — N2581 Secondary hyperparathyroidism of renal origin: Secondary | ICD-10-CM | POA: Diagnosis not present

## 2012-03-17 DIAGNOSIS — D631 Anemia in chronic kidney disease: Secondary | ICD-10-CM | POA: Diagnosis not present

## 2012-03-25 DIAGNOSIS — R51 Headache: Secondary | ICD-10-CM | POA: Diagnosis not present

## 2012-03-25 DIAGNOSIS — H5310 Unspecified subjective visual disturbances: Secondary | ICD-10-CM | POA: Diagnosis not present

## 2012-04-11 DIAGNOSIS — N186 End stage renal disease: Secondary | ICD-10-CM | POA: Diagnosis not present

## 2012-04-14 DIAGNOSIS — N186 End stage renal disease: Secondary | ICD-10-CM | POA: Diagnosis not present

## 2012-04-14 DIAGNOSIS — E875 Hyperkalemia: Secondary | ICD-10-CM | POA: Diagnosis not present

## 2012-04-14 DIAGNOSIS — D631 Anemia in chronic kidney disease: Secondary | ICD-10-CM | POA: Diagnosis not present

## 2012-04-14 DIAGNOSIS — N039 Chronic nephritic syndrome with unspecified morphologic changes: Secondary | ICD-10-CM | POA: Diagnosis not present

## 2012-04-14 DIAGNOSIS — N2581 Secondary hyperparathyroidism of renal origin: Secondary | ICD-10-CM | POA: Diagnosis not present

## 2012-04-16 DIAGNOSIS — D631 Anemia in chronic kidney disease: Secondary | ICD-10-CM | POA: Diagnosis not present

## 2012-04-16 DIAGNOSIS — E875 Hyperkalemia: Secondary | ICD-10-CM | POA: Diagnosis not present

## 2012-04-16 DIAGNOSIS — N186 End stage renal disease: Secondary | ICD-10-CM | POA: Diagnosis not present

## 2012-04-16 DIAGNOSIS — N2581 Secondary hyperparathyroidism of renal origin: Secondary | ICD-10-CM | POA: Diagnosis not present

## 2012-04-18 DIAGNOSIS — N2581 Secondary hyperparathyroidism of renal origin: Secondary | ICD-10-CM | POA: Diagnosis not present

## 2012-04-18 DIAGNOSIS — N039 Chronic nephritic syndrome with unspecified morphologic changes: Secondary | ICD-10-CM | POA: Diagnosis not present

## 2012-04-18 DIAGNOSIS — N186 End stage renal disease: Secondary | ICD-10-CM | POA: Diagnosis not present

## 2012-04-18 DIAGNOSIS — E875 Hyperkalemia: Secondary | ICD-10-CM | POA: Diagnosis not present

## 2012-04-18 DIAGNOSIS — D631 Anemia in chronic kidney disease: Secondary | ICD-10-CM | POA: Diagnosis not present

## 2012-04-21 DIAGNOSIS — D631 Anemia in chronic kidney disease: Secondary | ICD-10-CM | POA: Diagnosis not present

## 2012-04-21 DIAGNOSIS — N186 End stage renal disease: Secondary | ICD-10-CM | POA: Diagnosis not present

## 2012-04-21 DIAGNOSIS — N2581 Secondary hyperparathyroidism of renal origin: Secondary | ICD-10-CM | POA: Diagnosis not present

## 2012-04-21 DIAGNOSIS — E875 Hyperkalemia: Secondary | ICD-10-CM | POA: Diagnosis not present

## 2012-04-23 DIAGNOSIS — D631 Anemia in chronic kidney disease: Secondary | ICD-10-CM | POA: Diagnosis not present

## 2012-04-23 DIAGNOSIS — N186 End stage renal disease: Secondary | ICD-10-CM | POA: Diagnosis not present

## 2012-04-23 DIAGNOSIS — N039 Chronic nephritic syndrome with unspecified morphologic changes: Secondary | ICD-10-CM | POA: Diagnosis not present

## 2012-04-23 DIAGNOSIS — N2581 Secondary hyperparathyroidism of renal origin: Secondary | ICD-10-CM | POA: Diagnosis not present

## 2012-04-23 DIAGNOSIS — E875 Hyperkalemia: Secondary | ICD-10-CM | POA: Diagnosis not present

## 2012-04-25 DIAGNOSIS — N2581 Secondary hyperparathyroidism of renal origin: Secondary | ICD-10-CM | POA: Diagnosis not present

## 2012-04-25 DIAGNOSIS — D631 Anemia in chronic kidney disease: Secondary | ICD-10-CM | POA: Diagnosis not present

## 2012-04-25 DIAGNOSIS — E875 Hyperkalemia: Secondary | ICD-10-CM | POA: Diagnosis not present

## 2012-04-25 DIAGNOSIS — N186 End stage renal disease: Secondary | ICD-10-CM | POA: Diagnosis not present

## 2012-04-28 DIAGNOSIS — N186 End stage renal disease: Secondary | ICD-10-CM | POA: Diagnosis not present

## 2012-04-28 DIAGNOSIS — N2581 Secondary hyperparathyroidism of renal origin: Secondary | ICD-10-CM | POA: Diagnosis not present

## 2012-04-28 DIAGNOSIS — E875 Hyperkalemia: Secondary | ICD-10-CM | POA: Diagnosis not present

## 2012-04-28 DIAGNOSIS — D631 Anemia in chronic kidney disease: Secondary | ICD-10-CM | POA: Diagnosis not present

## 2012-04-30 DIAGNOSIS — E875 Hyperkalemia: Secondary | ICD-10-CM | POA: Diagnosis not present

## 2012-04-30 DIAGNOSIS — D631 Anemia in chronic kidney disease: Secondary | ICD-10-CM | POA: Diagnosis not present

## 2012-04-30 DIAGNOSIS — N186 End stage renal disease: Secondary | ICD-10-CM | POA: Diagnosis not present

## 2012-04-30 DIAGNOSIS — N039 Chronic nephritic syndrome with unspecified morphologic changes: Secondary | ICD-10-CM | POA: Diagnosis not present

## 2012-04-30 DIAGNOSIS — N2581 Secondary hyperparathyroidism of renal origin: Secondary | ICD-10-CM | POA: Diagnosis not present

## 2012-05-02 DIAGNOSIS — N039 Chronic nephritic syndrome with unspecified morphologic changes: Secondary | ICD-10-CM | POA: Diagnosis not present

## 2012-05-02 DIAGNOSIS — N186 End stage renal disease: Secondary | ICD-10-CM | POA: Diagnosis not present

## 2012-05-02 DIAGNOSIS — D631 Anemia in chronic kidney disease: Secondary | ICD-10-CM | POA: Diagnosis not present

## 2012-05-02 DIAGNOSIS — N2581 Secondary hyperparathyroidism of renal origin: Secondary | ICD-10-CM | POA: Diagnosis not present

## 2012-05-02 DIAGNOSIS — E875 Hyperkalemia: Secondary | ICD-10-CM | POA: Diagnosis not present

## 2012-05-05 DIAGNOSIS — D631 Anemia in chronic kidney disease: Secondary | ICD-10-CM | POA: Diagnosis not present

## 2012-05-05 DIAGNOSIS — N2581 Secondary hyperparathyroidism of renal origin: Secondary | ICD-10-CM | POA: Diagnosis not present

## 2012-05-05 DIAGNOSIS — N186 End stage renal disease: Secondary | ICD-10-CM | POA: Diagnosis not present

## 2012-05-05 DIAGNOSIS — E875 Hyperkalemia: Secondary | ICD-10-CM | POA: Diagnosis not present

## 2012-05-07 DIAGNOSIS — E875 Hyperkalemia: Secondary | ICD-10-CM | POA: Diagnosis not present

## 2012-05-07 DIAGNOSIS — N186 End stage renal disease: Secondary | ICD-10-CM | POA: Diagnosis not present

## 2012-05-07 DIAGNOSIS — N2581 Secondary hyperparathyroidism of renal origin: Secondary | ICD-10-CM | POA: Diagnosis not present

## 2012-05-07 DIAGNOSIS — D631 Anemia in chronic kidney disease: Secondary | ICD-10-CM | POA: Diagnosis not present

## 2012-05-09 DIAGNOSIS — N186 End stage renal disease: Secondary | ICD-10-CM | POA: Diagnosis not present

## 2012-05-09 DIAGNOSIS — E875 Hyperkalemia: Secondary | ICD-10-CM | POA: Diagnosis not present

## 2012-05-09 DIAGNOSIS — N2581 Secondary hyperparathyroidism of renal origin: Secondary | ICD-10-CM | POA: Diagnosis not present

## 2012-05-09 DIAGNOSIS — D631 Anemia in chronic kidney disease: Secondary | ICD-10-CM | POA: Diagnosis not present

## 2012-05-12 DIAGNOSIS — D631 Anemia in chronic kidney disease: Secondary | ICD-10-CM | POA: Diagnosis not present

## 2012-05-12 DIAGNOSIS — N186 End stage renal disease: Secondary | ICD-10-CM | POA: Diagnosis not present

## 2012-05-12 DIAGNOSIS — N039 Chronic nephritic syndrome with unspecified morphologic changes: Secondary | ICD-10-CM | POA: Diagnosis not present

## 2012-05-12 DIAGNOSIS — N2581 Secondary hyperparathyroidism of renal origin: Secondary | ICD-10-CM | POA: Diagnosis not present

## 2012-05-12 DIAGNOSIS — E875 Hyperkalemia: Secondary | ICD-10-CM | POA: Diagnosis not present

## 2012-05-14 DIAGNOSIS — N186 End stage renal disease: Secondary | ICD-10-CM | POA: Diagnosis not present

## 2012-05-14 DIAGNOSIS — N2581 Secondary hyperparathyroidism of renal origin: Secondary | ICD-10-CM | POA: Diagnosis not present

## 2012-05-14 DIAGNOSIS — E875 Hyperkalemia: Secondary | ICD-10-CM | POA: Diagnosis not present

## 2012-05-14 DIAGNOSIS — D631 Anemia in chronic kidney disease: Secondary | ICD-10-CM | POA: Diagnosis not present

## 2012-05-14 DIAGNOSIS — D509 Iron deficiency anemia, unspecified: Secondary | ICD-10-CM | POA: Diagnosis not present

## 2012-05-14 DIAGNOSIS — N039 Chronic nephritic syndrome with unspecified morphologic changes: Secondary | ICD-10-CM | POA: Diagnosis not present

## 2012-05-16 DIAGNOSIS — E875 Hyperkalemia: Secondary | ICD-10-CM | POA: Diagnosis not present

## 2012-05-16 DIAGNOSIS — D509 Iron deficiency anemia, unspecified: Secondary | ICD-10-CM | POA: Diagnosis not present

## 2012-05-16 DIAGNOSIS — N039 Chronic nephritic syndrome with unspecified morphologic changes: Secondary | ICD-10-CM | POA: Diagnosis not present

## 2012-05-16 DIAGNOSIS — D631 Anemia in chronic kidney disease: Secondary | ICD-10-CM | POA: Diagnosis not present

## 2012-05-16 DIAGNOSIS — N2581 Secondary hyperparathyroidism of renal origin: Secondary | ICD-10-CM | POA: Diagnosis not present

## 2012-05-16 DIAGNOSIS — N186 End stage renal disease: Secondary | ICD-10-CM | POA: Diagnosis not present

## 2012-05-19 DIAGNOSIS — E875 Hyperkalemia: Secondary | ICD-10-CM | POA: Diagnosis not present

## 2012-05-19 DIAGNOSIS — D509 Iron deficiency anemia, unspecified: Secondary | ICD-10-CM | POA: Diagnosis not present

## 2012-05-19 DIAGNOSIS — N186 End stage renal disease: Secondary | ICD-10-CM | POA: Diagnosis not present

## 2012-05-19 DIAGNOSIS — D631 Anemia in chronic kidney disease: Secondary | ICD-10-CM | POA: Diagnosis not present

## 2012-05-19 DIAGNOSIS — N2581 Secondary hyperparathyroidism of renal origin: Secondary | ICD-10-CM | POA: Diagnosis not present

## 2012-05-21 DIAGNOSIS — D509 Iron deficiency anemia, unspecified: Secondary | ICD-10-CM | POA: Diagnosis not present

## 2012-05-21 DIAGNOSIS — N2581 Secondary hyperparathyroidism of renal origin: Secondary | ICD-10-CM | POA: Diagnosis not present

## 2012-05-21 DIAGNOSIS — E875 Hyperkalemia: Secondary | ICD-10-CM | POA: Diagnosis not present

## 2012-05-21 DIAGNOSIS — N186 End stage renal disease: Secondary | ICD-10-CM | POA: Diagnosis not present

## 2012-05-21 DIAGNOSIS — N039 Chronic nephritic syndrome with unspecified morphologic changes: Secondary | ICD-10-CM | POA: Diagnosis not present

## 2012-05-21 DIAGNOSIS — D631 Anemia in chronic kidney disease: Secondary | ICD-10-CM | POA: Diagnosis not present

## 2012-05-23 DIAGNOSIS — E875 Hyperkalemia: Secondary | ICD-10-CM | POA: Diagnosis not present

## 2012-05-23 DIAGNOSIS — D631 Anemia in chronic kidney disease: Secondary | ICD-10-CM | POA: Diagnosis not present

## 2012-05-23 DIAGNOSIS — N186 End stage renal disease: Secondary | ICD-10-CM | POA: Diagnosis not present

## 2012-05-23 DIAGNOSIS — N2581 Secondary hyperparathyroidism of renal origin: Secondary | ICD-10-CM | POA: Diagnosis not present

## 2012-05-23 DIAGNOSIS — D509 Iron deficiency anemia, unspecified: Secondary | ICD-10-CM | POA: Diagnosis not present

## 2012-05-26 DIAGNOSIS — D631 Anemia in chronic kidney disease: Secondary | ICD-10-CM | POA: Diagnosis not present

## 2012-05-26 DIAGNOSIS — E875 Hyperkalemia: Secondary | ICD-10-CM | POA: Diagnosis not present

## 2012-05-26 DIAGNOSIS — N039 Chronic nephritic syndrome with unspecified morphologic changes: Secondary | ICD-10-CM | POA: Diagnosis not present

## 2012-05-26 DIAGNOSIS — D509 Iron deficiency anemia, unspecified: Secondary | ICD-10-CM | POA: Diagnosis not present

## 2012-05-26 DIAGNOSIS — N2581 Secondary hyperparathyroidism of renal origin: Secondary | ICD-10-CM | POA: Diagnosis not present

## 2012-05-26 DIAGNOSIS — N186 End stage renal disease: Secondary | ICD-10-CM | POA: Diagnosis not present

## 2012-05-28 DIAGNOSIS — N186 End stage renal disease: Secondary | ICD-10-CM | POA: Diagnosis not present

## 2012-05-28 DIAGNOSIS — D631 Anemia in chronic kidney disease: Secondary | ICD-10-CM | POA: Diagnosis not present

## 2012-05-28 DIAGNOSIS — N2581 Secondary hyperparathyroidism of renal origin: Secondary | ICD-10-CM | POA: Diagnosis not present

## 2012-05-28 DIAGNOSIS — D509 Iron deficiency anemia, unspecified: Secondary | ICD-10-CM | POA: Diagnosis not present

## 2012-05-28 DIAGNOSIS — E875 Hyperkalemia: Secondary | ICD-10-CM | POA: Diagnosis not present

## 2012-05-30 DIAGNOSIS — N039 Chronic nephritic syndrome with unspecified morphologic changes: Secondary | ICD-10-CM | POA: Diagnosis not present

## 2012-05-30 DIAGNOSIS — N2581 Secondary hyperparathyroidism of renal origin: Secondary | ICD-10-CM | POA: Diagnosis not present

## 2012-05-30 DIAGNOSIS — D631 Anemia in chronic kidney disease: Secondary | ICD-10-CM | POA: Diagnosis not present

## 2012-05-30 DIAGNOSIS — E875 Hyperkalemia: Secondary | ICD-10-CM | POA: Diagnosis not present

## 2012-05-30 DIAGNOSIS — D509 Iron deficiency anemia, unspecified: Secondary | ICD-10-CM | POA: Diagnosis not present

## 2012-05-30 DIAGNOSIS — N186 End stage renal disease: Secondary | ICD-10-CM | POA: Diagnosis not present

## 2012-06-02 DIAGNOSIS — N2581 Secondary hyperparathyroidism of renal origin: Secondary | ICD-10-CM | POA: Diagnosis not present

## 2012-06-02 DIAGNOSIS — D509 Iron deficiency anemia, unspecified: Secondary | ICD-10-CM | POA: Diagnosis not present

## 2012-06-02 DIAGNOSIS — N039 Chronic nephritic syndrome with unspecified morphologic changes: Secondary | ICD-10-CM | POA: Diagnosis not present

## 2012-06-02 DIAGNOSIS — E875 Hyperkalemia: Secondary | ICD-10-CM | POA: Diagnosis not present

## 2012-06-02 DIAGNOSIS — D631 Anemia in chronic kidney disease: Secondary | ICD-10-CM | POA: Diagnosis not present

## 2012-06-02 DIAGNOSIS — N186 End stage renal disease: Secondary | ICD-10-CM | POA: Diagnosis not present

## 2012-06-04 DIAGNOSIS — E875 Hyperkalemia: Secondary | ICD-10-CM | POA: Diagnosis not present

## 2012-06-04 DIAGNOSIS — E1129 Type 2 diabetes mellitus with other diabetic kidney complication: Secondary | ICD-10-CM | POA: Diagnosis not present

## 2012-06-04 DIAGNOSIS — D631 Anemia in chronic kidney disease: Secondary | ICD-10-CM | POA: Diagnosis not present

## 2012-06-04 DIAGNOSIS — N186 End stage renal disease: Secondary | ICD-10-CM | POA: Diagnosis not present

## 2012-06-04 DIAGNOSIS — N2581 Secondary hyperparathyroidism of renal origin: Secondary | ICD-10-CM | POA: Diagnosis not present

## 2012-06-04 DIAGNOSIS — D509 Iron deficiency anemia, unspecified: Secondary | ICD-10-CM | POA: Diagnosis not present

## 2012-06-06 DIAGNOSIS — N2581 Secondary hyperparathyroidism of renal origin: Secondary | ICD-10-CM | POA: Diagnosis not present

## 2012-06-06 DIAGNOSIS — N039 Chronic nephritic syndrome with unspecified morphologic changes: Secondary | ICD-10-CM | POA: Diagnosis not present

## 2012-06-06 DIAGNOSIS — N186 End stage renal disease: Secondary | ICD-10-CM | POA: Diagnosis not present

## 2012-06-06 DIAGNOSIS — D509 Iron deficiency anemia, unspecified: Secondary | ICD-10-CM | POA: Diagnosis not present

## 2012-06-06 DIAGNOSIS — D631 Anemia in chronic kidney disease: Secondary | ICD-10-CM | POA: Diagnosis not present

## 2012-06-06 DIAGNOSIS — E875 Hyperkalemia: Secondary | ICD-10-CM | POA: Diagnosis not present

## 2012-06-09 DIAGNOSIS — N2581 Secondary hyperparathyroidism of renal origin: Secondary | ICD-10-CM | POA: Diagnosis not present

## 2012-06-09 DIAGNOSIS — N186 End stage renal disease: Secondary | ICD-10-CM | POA: Diagnosis not present

## 2012-06-09 DIAGNOSIS — D631 Anemia in chronic kidney disease: Secondary | ICD-10-CM | POA: Diagnosis not present

## 2012-06-09 DIAGNOSIS — E875 Hyperkalemia: Secondary | ICD-10-CM | POA: Diagnosis not present

## 2012-06-09 DIAGNOSIS — D509 Iron deficiency anemia, unspecified: Secondary | ICD-10-CM | POA: Diagnosis not present

## 2012-06-10 DIAGNOSIS — D509 Iron deficiency anemia, unspecified: Secondary | ICD-10-CM | POA: Insufficient documentation

## 2012-06-11 DIAGNOSIS — D631 Anemia in chronic kidney disease: Secondary | ICD-10-CM | POA: Diagnosis not present

## 2012-06-11 DIAGNOSIS — D509 Iron deficiency anemia, unspecified: Secondary | ICD-10-CM | POA: Diagnosis not present

## 2012-06-11 DIAGNOSIS — N2581 Secondary hyperparathyroidism of renal origin: Secondary | ICD-10-CM | POA: Diagnosis not present

## 2012-06-11 DIAGNOSIS — N186 End stage renal disease: Secondary | ICD-10-CM | POA: Diagnosis not present

## 2012-06-11 DIAGNOSIS — E875 Hyperkalemia: Secondary | ICD-10-CM | POA: Diagnosis not present

## 2012-06-13 DIAGNOSIS — E875 Hyperkalemia: Secondary | ICD-10-CM | POA: Diagnosis not present

## 2012-06-13 DIAGNOSIS — D631 Anemia in chronic kidney disease: Secondary | ICD-10-CM | POA: Diagnosis not present

## 2012-06-13 DIAGNOSIS — N186 End stage renal disease: Secondary | ICD-10-CM | POA: Diagnosis not present

## 2012-06-13 DIAGNOSIS — D509 Iron deficiency anemia, unspecified: Secondary | ICD-10-CM | POA: Diagnosis not present

## 2012-06-13 DIAGNOSIS — N2581 Secondary hyperparathyroidism of renal origin: Secondary | ICD-10-CM | POA: Diagnosis not present

## 2012-06-20 DIAGNOSIS — H2510 Age-related nuclear cataract, unspecified eye: Secondary | ICD-10-CM | POA: Diagnosis not present

## 2012-06-20 DIAGNOSIS — H47329 Drusen of optic disc, unspecified eye: Secondary | ICD-10-CM | POA: Diagnosis not present

## 2012-06-23 ENCOUNTER — Ambulatory Visit (HOSPITAL_BASED_OUTPATIENT_CLINIC_OR_DEPARTMENT_OTHER): Payer: Medicare Other | Attending: Nephrology | Admitting: Radiology

## 2012-06-23 VITALS — Ht 73.0 in | Wt 230.0 lb

## 2012-06-23 DIAGNOSIS — G4761 Periodic limb movement disorder: Secondary | ICD-10-CM | POA: Diagnosis not present

## 2012-06-23 DIAGNOSIS — Z9989 Dependence on other enabling machines and devices: Secondary | ICD-10-CM

## 2012-06-23 DIAGNOSIS — G4733 Obstructive sleep apnea (adult) (pediatric): Secondary | ICD-10-CM | POA: Diagnosis not present

## 2012-06-28 DIAGNOSIS — G4733 Obstructive sleep apnea (adult) (pediatric): Secondary | ICD-10-CM | POA: Diagnosis not present

## 2012-06-28 DIAGNOSIS — R0609 Other forms of dyspnea: Secondary | ICD-10-CM | POA: Diagnosis not present

## 2012-06-28 DIAGNOSIS — G4761 Periodic limb movement disorder: Secondary | ICD-10-CM | POA: Diagnosis not present

## 2012-06-28 DIAGNOSIS — R0989 Other specified symptoms and signs involving the circulatory and respiratory systems: Secondary | ICD-10-CM

## 2012-06-28 NOTE — Procedures (Signed)
[name], [name]             ACCOUNT NO.:  [id number]  MEDICAL RECORD NO.:  [id number]          PATIENT TYPE:  OUT  LOCATION:  SLEEP CENTER                 FACILITY:  [facility]  PHYSICIAN:  Clinton D. Annamaria Boots, MD, FCCP, FACPDATE OF BIRTH:  11/16/1960  DATE OF STUDY:  06/23/2012                           NOCTURNAL POLYSOMNOGRAM  REFERRING PHYSICIAN:  Caren Griffins B. Lorrene Reid, M.D.  INDICATION FOR STUDY:  Hypersomnia with sleep apnea.  EPWORTH SLEEPINESS SCORE:  3/24.  BMI 30.3, weight 230 pounds, height 73 inches, neck 16 inches.  MEDICATIONS:  Home medications charted and reviewed.  SLEEP ARCHITECTURE:  Split study protocol.  During the diagnostic phase, total sleep time 123.5 minutes with sleep efficiency 87.3%.  Stage I was 6.1%, stage II 73.3%, stage III absent, REM 20.6% of total sleep time. Sleep latency 6.5 minutes, REM latency 63 minutes.  Awake after sleep onset 10.5 minutes.  Arousal index 24.8.  Bedtime medication:  None.  RESPIRATORY DATA:  Split study protocol.  Apnea/hypopnea index (AHI) 36.9 per hour.  Total of 76 events was scored including 29 obstructive apneas, 5 central apneas, 7 mixed apneas, 35 hypopneas.  Events were associated with supine sleep position.  REM AHI 91.8 per hour.  CPAP was then titrated to 16 CWP, AHI 0 per hour.  He wore a large ResMed AirFit F10 full-face mask with heated humidifier.  OXYGEN DATA:  Moderately loud snoring before CPAP with oxygen desaturation to a nadir of 67% on room air.  With CPAP control, mean oxygen saturation held 96.1% on room air and snoring was prevented.  CARDIAC DATA:  Sinus rhythm with occasional PVC and PACs.  MOVEMENT-PARASOMNIA:  Limb jerks were noted mainly during the titration phase where a total of 84 limb jerks were counted of which 4 were associated with arousals or awakenings for periodic limb movement with arousal index of 1 per hour.  No bathroom trips.  IMPRESSIONS-RECOMMENDATIONS: 1. Severe obstructive  sleep apnea/hypopnea syndrome, AHI 36.9 per hour     with supine events.  Moderately loud snoring with oxygen     desaturation to a nadir of 67% on room air. 2. Successful CPAP titration to 16 CWP, AHI 0 per hour.  He wore a     large ResMed AirFit F10 full-face mask with heated humidifier.     Snoring was prevented and mean oxygen saturation held 96.1% with     CPAP. 3. Minimal periodic limb movement with arousal.  During the titration     phase, a total of 84 limb jerks was     counted of which 4 were associated with arousal or awakening for     periodic limb movement with arousal index of 1 per hour.  This     pattern may fade when he adjusts the CPAP in the home environment.     Clinton D. Annamaria Boots, MD, [facility], FACP Diplomate, [place] Board of Sleep Medicine    CDY/MEDQ  D:  06/28/2012 09:58:15  T:  06/28/2012 13:28:06  Job:  [id number]

## 2012-07-12 DIAGNOSIS — N186 End stage renal disease: Secondary | ICD-10-CM | POA: Diagnosis not present

## 2012-07-14 DIAGNOSIS — D631 Anemia in chronic kidney disease: Secondary | ICD-10-CM | POA: Diagnosis not present

## 2012-07-14 DIAGNOSIS — N2581 Secondary hyperparathyroidism of renal origin: Secondary | ICD-10-CM | POA: Diagnosis not present

## 2012-07-14 DIAGNOSIS — N186 End stage renal disease: Secondary | ICD-10-CM | POA: Diagnosis not present

## 2012-07-14 DIAGNOSIS — N039 Chronic nephritic syndrome with unspecified morphologic changes: Secondary | ICD-10-CM | POA: Diagnosis not present

## 2012-07-14 DIAGNOSIS — D509 Iron deficiency anemia, unspecified: Secondary | ICD-10-CM | POA: Diagnosis not present

## 2012-07-14 DIAGNOSIS — E875 Hyperkalemia: Secondary | ICD-10-CM | POA: Diagnosis not present

## 2012-07-16 DIAGNOSIS — N039 Chronic nephritic syndrome with unspecified morphologic changes: Secondary | ICD-10-CM | POA: Diagnosis not present

## 2012-07-16 DIAGNOSIS — N186 End stage renal disease: Secondary | ICD-10-CM | POA: Diagnosis not present

## 2012-07-16 DIAGNOSIS — E875 Hyperkalemia: Secondary | ICD-10-CM | POA: Diagnosis not present

## 2012-07-16 DIAGNOSIS — D631 Anemia in chronic kidney disease: Secondary | ICD-10-CM | POA: Diagnosis not present

## 2012-07-16 DIAGNOSIS — D509 Iron deficiency anemia, unspecified: Secondary | ICD-10-CM | POA: Diagnosis not present

## 2012-07-16 DIAGNOSIS — N2581 Secondary hyperparathyroidism of renal origin: Secondary | ICD-10-CM | POA: Diagnosis not present

## 2012-07-18 DIAGNOSIS — D509 Iron deficiency anemia, unspecified: Secondary | ICD-10-CM | POA: Diagnosis not present

## 2012-07-18 DIAGNOSIS — N2581 Secondary hyperparathyroidism of renal origin: Secondary | ICD-10-CM | POA: Diagnosis not present

## 2012-07-18 DIAGNOSIS — D631 Anemia in chronic kidney disease: Secondary | ICD-10-CM | POA: Diagnosis not present

## 2012-07-18 DIAGNOSIS — E875 Hyperkalemia: Secondary | ICD-10-CM | POA: Diagnosis not present

## 2012-07-18 DIAGNOSIS — N186 End stage renal disease: Secondary | ICD-10-CM | POA: Diagnosis not present

## 2012-07-21 DIAGNOSIS — D631 Anemia in chronic kidney disease: Secondary | ICD-10-CM | POA: Diagnosis not present

## 2012-07-21 DIAGNOSIS — N039 Chronic nephritic syndrome with unspecified morphologic changes: Secondary | ICD-10-CM | POA: Diagnosis not present

## 2012-07-21 DIAGNOSIS — N186 End stage renal disease: Secondary | ICD-10-CM | POA: Diagnosis not present

## 2012-07-21 DIAGNOSIS — D509 Iron deficiency anemia, unspecified: Secondary | ICD-10-CM | POA: Diagnosis not present

## 2012-07-21 DIAGNOSIS — E875 Hyperkalemia: Secondary | ICD-10-CM | POA: Diagnosis not present

## 2012-07-21 DIAGNOSIS — N2581 Secondary hyperparathyroidism of renal origin: Secondary | ICD-10-CM | POA: Diagnosis not present

## 2012-07-23 DIAGNOSIS — N186 End stage renal disease: Secondary | ICD-10-CM | POA: Diagnosis not present

## 2012-07-23 DIAGNOSIS — N2581 Secondary hyperparathyroidism of renal origin: Secondary | ICD-10-CM | POA: Diagnosis not present

## 2012-07-23 DIAGNOSIS — D631 Anemia in chronic kidney disease: Secondary | ICD-10-CM | POA: Diagnosis not present

## 2012-07-23 DIAGNOSIS — E875 Hyperkalemia: Secondary | ICD-10-CM | POA: Diagnosis not present

## 2012-07-23 DIAGNOSIS — D509 Iron deficiency anemia, unspecified: Secondary | ICD-10-CM | POA: Diagnosis not present

## 2012-07-25 DIAGNOSIS — N2581 Secondary hyperparathyroidism of renal origin: Secondary | ICD-10-CM | POA: Diagnosis not present

## 2012-07-25 DIAGNOSIS — N039 Chronic nephritic syndrome with unspecified morphologic changes: Secondary | ICD-10-CM | POA: Diagnosis not present

## 2012-07-25 DIAGNOSIS — D631 Anemia in chronic kidney disease: Secondary | ICD-10-CM | POA: Diagnosis not present

## 2012-07-25 DIAGNOSIS — D509 Iron deficiency anemia, unspecified: Secondary | ICD-10-CM | POA: Diagnosis not present

## 2012-07-25 DIAGNOSIS — E875 Hyperkalemia: Secondary | ICD-10-CM | POA: Diagnosis not present

## 2012-07-25 DIAGNOSIS — N186 End stage renal disease: Secondary | ICD-10-CM | POA: Diagnosis not present

## 2012-07-28 DIAGNOSIS — N2581 Secondary hyperparathyroidism of renal origin: Secondary | ICD-10-CM | POA: Diagnosis not present

## 2012-07-28 DIAGNOSIS — D509 Iron deficiency anemia, unspecified: Secondary | ICD-10-CM | POA: Diagnosis not present

## 2012-07-28 DIAGNOSIS — N186 End stage renal disease: Secondary | ICD-10-CM | POA: Diagnosis not present

## 2012-07-28 DIAGNOSIS — E875 Hyperkalemia: Secondary | ICD-10-CM | POA: Diagnosis not present

## 2012-07-28 DIAGNOSIS — D631 Anemia in chronic kidney disease: Secondary | ICD-10-CM | POA: Diagnosis not present

## 2012-07-30 DIAGNOSIS — N186 End stage renal disease: Secondary | ICD-10-CM | POA: Diagnosis not present

## 2012-07-30 DIAGNOSIS — D509 Iron deficiency anemia, unspecified: Secondary | ICD-10-CM | POA: Diagnosis not present

## 2012-07-30 DIAGNOSIS — E875 Hyperkalemia: Secondary | ICD-10-CM | POA: Diagnosis not present

## 2012-07-30 DIAGNOSIS — D631 Anemia in chronic kidney disease: Secondary | ICD-10-CM | POA: Diagnosis not present

## 2012-07-30 DIAGNOSIS — N2581 Secondary hyperparathyroidism of renal origin: Secondary | ICD-10-CM | POA: Diagnosis not present

## 2012-08-01 DIAGNOSIS — D509 Iron deficiency anemia, unspecified: Secondary | ICD-10-CM | POA: Diagnosis not present

## 2012-08-01 DIAGNOSIS — D631 Anemia in chronic kidney disease: Secondary | ICD-10-CM | POA: Diagnosis not present

## 2012-08-01 DIAGNOSIS — N186 End stage renal disease: Secondary | ICD-10-CM | POA: Diagnosis not present

## 2012-08-01 DIAGNOSIS — E875 Hyperkalemia: Secondary | ICD-10-CM | POA: Diagnosis not present

## 2012-08-01 DIAGNOSIS — N2581 Secondary hyperparathyroidism of renal origin: Secondary | ICD-10-CM | POA: Diagnosis not present

## 2012-08-04 DIAGNOSIS — D509 Iron deficiency anemia, unspecified: Secondary | ICD-10-CM | POA: Diagnosis not present

## 2012-08-04 DIAGNOSIS — E875 Hyperkalemia: Secondary | ICD-10-CM | POA: Diagnosis not present

## 2012-08-04 DIAGNOSIS — N2581 Secondary hyperparathyroidism of renal origin: Secondary | ICD-10-CM | POA: Diagnosis not present

## 2012-08-04 DIAGNOSIS — D631 Anemia in chronic kidney disease: Secondary | ICD-10-CM | POA: Diagnosis not present

## 2012-08-04 DIAGNOSIS — N186 End stage renal disease: Secondary | ICD-10-CM | POA: Diagnosis not present

## 2012-08-06 DIAGNOSIS — N039 Chronic nephritic syndrome with unspecified morphologic changes: Secondary | ICD-10-CM | POA: Diagnosis not present

## 2012-08-06 DIAGNOSIS — N2581 Secondary hyperparathyroidism of renal origin: Secondary | ICD-10-CM | POA: Diagnosis not present

## 2012-08-06 DIAGNOSIS — E875 Hyperkalemia: Secondary | ICD-10-CM | POA: Diagnosis not present

## 2012-08-06 DIAGNOSIS — D509 Iron deficiency anemia, unspecified: Secondary | ICD-10-CM | POA: Diagnosis not present

## 2012-08-06 DIAGNOSIS — N186 End stage renal disease: Secondary | ICD-10-CM | POA: Diagnosis not present

## 2012-08-06 DIAGNOSIS — D631 Anemia in chronic kidney disease: Secondary | ICD-10-CM | POA: Diagnosis not present

## 2012-08-08 DIAGNOSIS — N186 End stage renal disease: Secondary | ICD-10-CM | POA: Diagnosis not present

## 2012-08-08 DIAGNOSIS — E875 Hyperkalemia: Secondary | ICD-10-CM | POA: Diagnosis not present

## 2012-08-08 DIAGNOSIS — D631 Anemia in chronic kidney disease: Secondary | ICD-10-CM | POA: Diagnosis not present

## 2012-08-08 DIAGNOSIS — D509 Iron deficiency anemia, unspecified: Secondary | ICD-10-CM | POA: Diagnosis not present

## 2012-08-08 DIAGNOSIS — N2581 Secondary hyperparathyroidism of renal origin: Secondary | ICD-10-CM | POA: Diagnosis not present

## 2012-08-11 DIAGNOSIS — N2581 Secondary hyperparathyroidism of renal origin: Secondary | ICD-10-CM | POA: Diagnosis not present

## 2012-08-11 DIAGNOSIS — D631 Anemia in chronic kidney disease: Secondary | ICD-10-CM | POA: Diagnosis not present

## 2012-08-11 DIAGNOSIS — D509 Iron deficiency anemia, unspecified: Secondary | ICD-10-CM | POA: Diagnosis not present

## 2012-08-11 DIAGNOSIS — E875 Hyperkalemia: Secondary | ICD-10-CM | POA: Diagnosis not present

## 2012-08-11 DIAGNOSIS — N039 Chronic nephritic syndrome with unspecified morphologic changes: Secondary | ICD-10-CM | POA: Diagnosis not present

## 2012-08-11 DIAGNOSIS — N186 End stage renal disease: Secondary | ICD-10-CM | POA: Diagnosis not present

## 2012-08-13 DIAGNOSIS — N2581 Secondary hyperparathyroidism of renal origin: Secondary | ICD-10-CM | POA: Diagnosis not present

## 2012-08-13 DIAGNOSIS — Z23 Encounter for immunization: Secondary | ICD-10-CM | POA: Diagnosis not present

## 2012-08-13 DIAGNOSIS — D631 Anemia in chronic kidney disease: Secondary | ICD-10-CM | POA: Diagnosis not present

## 2012-08-13 DIAGNOSIS — E875 Hyperkalemia: Secondary | ICD-10-CM | POA: Diagnosis not present

## 2012-08-13 DIAGNOSIS — D509 Iron deficiency anemia, unspecified: Secondary | ICD-10-CM | POA: Diagnosis not present

## 2012-08-13 DIAGNOSIS — N186 End stage renal disease: Secondary | ICD-10-CM | POA: Diagnosis not present

## 2012-08-19 DIAGNOSIS — N186 End stage renal disease: Secondary | ICD-10-CM | POA: Diagnosis not present

## 2012-08-19 DIAGNOSIS — T82898A Other specified complication of vascular prosthetic devices, implants and grafts, initial encounter: Secondary | ICD-10-CM | POA: Diagnosis not present

## 2012-08-19 DIAGNOSIS — I871 Compression of vein: Secondary | ICD-10-CM | POA: Diagnosis not present

## 2012-08-29 ENCOUNTER — Encounter (HOSPITAL_COMMUNITY): Payer: Self-pay | Admitting: Emergency Medicine

## 2012-08-29 ENCOUNTER — Emergency Department (INDEPENDENT_AMBULATORY_CARE_PROVIDER_SITE_OTHER)
Admission: EM | Admit: 2012-08-29 | Discharge: 2012-08-29 | Disposition: A | Discharge status: Home / Self Care | Payer: Medicare Other | Source: Home / Self Care

## 2012-08-29 DIAGNOSIS — H6122 Impacted cerumen, left ear: Secondary | ICD-10-CM

## 2012-08-29 DIAGNOSIS — H6120 Impacted cerumen, unspecified ear: Secondary | ICD-10-CM

## 2012-08-29 NOTE — ED Notes (Signed)
Pt c/o left ear pain x 2 weeks. Pt denies fever. Just feels pain and can't hear. Pt reports he used peroxide and water to rinse out ear. Pt is alert and oriented.

## 2012-08-29 NOTE — ED Provider Notes (Signed)
[name] is a 52 y.o. male who presents to Urgent Care today for left ear pain and decreased hearing over the last 2 weeks. Similar to prior episodes of cerumen impaction. Patient has tried hydrogen peroxide diluted in water which has not helped much. No fevers or chills.   PMH reviewed. Dialysis dependent chronic kidney disease currently doing well History  Substance Use Topics  . Smoking status: Former Smoker    Quit date: 06/19/1990  . Smokeless tobacco: Not on file  . Alcohol Use: 0.0 oz/week   ROS as above Medications reviewed. Current Facility-Administered Medications  Medication Dose Route Frequency Provider Last Rate Last Dose  . 0.9 %  sodium chloride infusion  500 mL Intravenous Continuous Inda Castle, MD       Current Outpatient Prescriptions  Medication Sig Dispense Refill  . amLODipine (NORVASC) 10 MG tablet Take 10 mg by mouth daily.        Marland Kitchen aspirin 325 MG tablet Take 325 mg by mouth daily.        Marland Kitchen gabapentin (NEURONTIN) 300 MG capsule Take 300 mg by mouth 2 (two) times daily.        Marland Kitchen lanthanum (FOSRENOL) 1000 MG chewable tablet Chew 1,000 mg by mouth 3 (three) times daily with meals.        . Multiple Vitamin (MULTI VITAMIN MENS PO) Take 1 tablet by mouth daily.        Marland Kitchen omeprazole (PRILOSEC) 20 MG capsule Take 20 mg by mouth daily.        . Sorbitol SOLN by Does not apply route. 30 to 60 ml as needed for constipation         Exam:  BP 108/73  Pulse 85  Temp(Src) 98 F (36.7 C) (Oral)  Resp 16  SpO2 100% Gen: Well NAD HEENT: EOMI,  MMM. Left ear impacted by cerumen.  Right ears normal tympanic membrane   Following removal of the impacted cerumen the tympanic membrane was normal appearing. Patient had significant resolution of his symptoms.   No results found for this or any previous visit (from the past 24 hour(s)). No results found.  Assessment and Plan: 52 y.o. male with left ear cerumen impaction.  Recommended Debrox ear drop.  Followup  with ear nose and throat as needed.      Gregor Hams, MD 08/29/12 [phone]

## 2012-09-03 DIAGNOSIS — E1129 Type 2 diabetes mellitus with other diabetic kidney complication: Secondary | ICD-10-CM | POA: Diagnosis not present

## 2012-09-11 DIAGNOSIS — N186 End stage renal disease: Secondary | ICD-10-CM | POA: Diagnosis not present

## 2012-09-12 DIAGNOSIS — N2581 Secondary hyperparathyroidism of renal origin: Secondary | ICD-10-CM | POA: Diagnosis not present

## 2012-09-12 DIAGNOSIS — N039 Chronic nephritic syndrome with unspecified morphologic changes: Secondary | ICD-10-CM | POA: Diagnosis not present

## 2012-09-12 DIAGNOSIS — D631 Anemia in chronic kidney disease: Secondary | ICD-10-CM | POA: Diagnosis not present

## 2012-09-12 DIAGNOSIS — E875 Hyperkalemia: Secondary | ICD-10-CM | POA: Diagnosis not present

## 2012-09-12 DIAGNOSIS — N186 End stage renal disease: Secondary | ICD-10-CM | POA: Diagnosis not present

## 2012-09-15 DIAGNOSIS — E875 Hyperkalemia: Secondary | ICD-10-CM | POA: Diagnosis not present

## 2012-09-15 DIAGNOSIS — N2581 Secondary hyperparathyroidism of renal origin: Secondary | ICD-10-CM | POA: Diagnosis not present

## 2012-09-15 DIAGNOSIS — D631 Anemia in chronic kidney disease: Secondary | ICD-10-CM | POA: Diagnosis not present

## 2012-09-15 DIAGNOSIS — N186 End stage renal disease: Secondary | ICD-10-CM | POA: Diagnosis not present

## 2012-09-17 DIAGNOSIS — E875 Hyperkalemia: Secondary | ICD-10-CM | POA: Diagnosis not present

## 2012-09-17 DIAGNOSIS — N186 End stage renal disease: Secondary | ICD-10-CM | POA: Diagnosis not present

## 2012-09-17 DIAGNOSIS — N039 Chronic nephritic syndrome with unspecified morphologic changes: Secondary | ICD-10-CM | POA: Diagnosis not present

## 2012-09-17 DIAGNOSIS — D631 Anemia in chronic kidney disease: Secondary | ICD-10-CM | POA: Diagnosis not present

## 2012-09-17 DIAGNOSIS — N2581 Secondary hyperparathyroidism of renal origin: Secondary | ICD-10-CM | POA: Diagnosis not present

## 2012-09-19 DIAGNOSIS — E875 Hyperkalemia: Secondary | ICD-10-CM | POA: Diagnosis not present

## 2012-09-19 DIAGNOSIS — N186 End stage renal disease: Secondary | ICD-10-CM | POA: Diagnosis not present

## 2012-09-19 DIAGNOSIS — N2581 Secondary hyperparathyroidism of renal origin: Secondary | ICD-10-CM | POA: Diagnosis not present

## 2012-09-19 DIAGNOSIS — D631 Anemia in chronic kidney disease: Secondary | ICD-10-CM | POA: Diagnosis not present

## 2012-09-22 DIAGNOSIS — N2581 Secondary hyperparathyroidism of renal origin: Secondary | ICD-10-CM | POA: Diagnosis not present

## 2012-09-22 DIAGNOSIS — D631 Anemia in chronic kidney disease: Secondary | ICD-10-CM | POA: Diagnosis not present

## 2012-09-22 DIAGNOSIS — E875 Hyperkalemia: Secondary | ICD-10-CM | POA: Diagnosis not present

## 2012-09-22 DIAGNOSIS — N186 End stage renal disease: Secondary | ICD-10-CM | POA: Diagnosis not present

## 2012-09-24 DIAGNOSIS — N186 End stage renal disease: Secondary | ICD-10-CM | POA: Diagnosis not present

## 2012-09-24 DIAGNOSIS — N2581 Secondary hyperparathyroidism of renal origin: Secondary | ICD-10-CM | POA: Diagnosis not present

## 2012-09-24 DIAGNOSIS — E875 Hyperkalemia: Secondary | ICD-10-CM | POA: Diagnosis not present

## 2012-09-24 DIAGNOSIS — D631 Anemia in chronic kidney disease: Secondary | ICD-10-CM | POA: Diagnosis not present

## 2012-09-26 DIAGNOSIS — N2581 Secondary hyperparathyroidism of renal origin: Secondary | ICD-10-CM | POA: Diagnosis not present

## 2012-09-26 DIAGNOSIS — E875 Hyperkalemia: Secondary | ICD-10-CM | POA: Diagnosis not present

## 2012-09-26 DIAGNOSIS — D631 Anemia in chronic kidney disease: Secondary | ICD-10-CM | POA: Diagnosis not present

## 2012-09-26 DIAGNOSIS — N186 End stage renal disease: Secondary | ICD-10-CM | POA: Diagnosis not present

## 2012-09-26 DIAGNOSIS — N039 Chronic nephritic syndrome with unspecified morphologic changes: Secondary | ICD-10-CM | POA: Diagnosis not present

## 2012-09-29 DIAGNOSIS — E875 Hyperkalemia: Secondary | ICD-10-CM | POA: Diagnosis not present

## 2012-09-29 DIAGNOSIS — N2581 Secondary hyperparathyroidism of renal origin: Secondary | ICD-10-CM | POA: Diagnosis not present

## 2012-09-29 DIAGNOSIS — N186 End stage renal disease: Secondary | ICD-10-CM | POA: Diagnosis not present

## 2012-09-29 DIAGNOSIS — D631 Anemia in chronic kidney disease: Secondary | ICD-10-CM | POA: Diagnosis not present

## 2012-10-01 DIAGNOSIS — E875 Hyperkalemia: Secondary | ICD-10-CM | POA: Diagnosis not present

## 2012-10-01 DIAGNOSIS — N186 End stage renal disease: Secondary | ICD-10-CM | POA: Diagnosis not present

## 2012-10-01 DIAGNOSIS — N2581 Secondary hyperparathyroidism of renal origin: Secondary | ICD-10-CM | POA: Diagnosis not present

## 2012-10-01 DIAGNOSIS — D631 Anemia in chronic kidney disease: Secondary | ICD-10-CM | POA: Diagnosis not present

## 2012-10-03 DIAGNOSIS — N2581 Secondary hyperparathyroidism of renal origin: Secondary | ICD-10-CM | POA: Diagnosis not present

## 2012-10-03 DIAGNOSIS — N186 End stage renal disease: Secondary | ICD-10-CM | POA: Diagnosis not present

## 2012-10-03 DIAGNOSIS — E875 Hyperkalemia: Secondary | ICD-10-CM | POA: Diagnosis not present

## 2012-10-03 DIAGNOSIS — D631 Anemia in chronic kidney disease: Secondary | ICD-10-CM | POA: Diagnosis not present

## 2012-10-06 DIAGNOSIS — N2581 Secondary hyperparathyroidism of renal origin: Secondary | ICD-10-CM | POA: Diagnosis not present

## 2012-10-06 DIAGNOSIS — N186 End stage renal disease: Secondary | ICD-10-CM | POA: Diagnosis not present

## 2012-10-06 DIAGNOSIS — E875 Hyperkalemia: Secondary | ICD-10-CM | POA: Diagnosis not present

## 2012-10-06 DIAGNOSIS — D631 Anemia in chronic kidney disease: Secondary | ICD-10-CM | POA: Diagnosis not present

## 2012-10-08 DIAGNOSIS — N186 End stage renal disease: Secondary | ICD-10-CM | POA: Diagnosis not present

## 2012-10-08 DIAGNOSIS — F1920 Other psychoactive substance dependence, uncomplicated: Secondary | ICD-10-CM | POA: Insufficient documentation

## 2012-10-08 DIAGNOSIS — D631 Anemia in chronic kidney disease: Secondary | ICD-10-CM | POA: Diagnosis not present

## 2012-10-08 DIAGNOSIS — E875 Hyperkalemia: Secondary | ICD-10-CM | POA: Diagnosis not present

## 2012-10-08 DIAGNOSIS — N2581 Secondary hyperparathyroidism of renal origin: Secondary | ICD-10-CM | POA: Diagnosis not present

## 2012-10-10 DIAGNOSIS — E875 Hyperkalemia: Secondary | ICD-10-CM | POA: Diagnosis not present

## 2012-10-10 DIAGNOSIS — N2581 Secondary hyperparathyroidism of renal origin: Secondary | ICD-10-CM | POA: Diagnosis not present

## 2012-10-10 DIAGNOSIS — N186 End stage renal disease: Secondary | ICD-10-CM | POA: Diagnosis not present

## 2012-10-10 DIAGNOSIS — D631 Anemia in chronic kidney disease: Secondary | ICD-10-CM | POA: Diagnosis not present

## 2012-10-12 DIAGNOSIS — N186 End stage renal disease: Secondary | ICD-10-CM | POA: Diagnosis not present

## 2012-10-13 DIAGNOSIS — E875 Hyperkalemia: Secondary | ICD-10-CM | POA: Diagnosis not present

## 2012-10-13 DIAGNOSIS — D631 Anemia in chronic kidney disease: Secondary | ICD-10-CM | POA: Diagnosis not present

## 2012-10-13 DIAGNOSIS — N2581 Secondary hyperparathyroidism of renal origin: Secondary | ICD-10-CM | POA: Diagnosis not present

## 2012-10-13 DIAGNOSIS — N186 End stage renal disease: Secondary | ICD-10-CM | POA: Diagnosis not present

## 2012-10-15 DIAGNOSIS — E875 Hyperkalemia: Secondary | ICD-10-CM | POA: Diagnosis not present

## 2012-10-15 DIAGNOSIS — N186 End stage renal disease: Secondary | ICD-10-CM | POA: Diagnosis not present

## 2012-10-15 DIAGNOSIS — D631 Anemia in chronic kidney disease: Secondary | ICD-10-CM | POA: Diagnosis not present

## 2012-10-15 DIAGNOSIS — N2581 Secondary hyperparathyroidism of renal origin: Secondary | ICD-10-CM | POA: Diagnosis not present

## 2012-10-17 DIAGNOSIS — N2581 Secondary hyperparathyroidism of renal origin: Secondary | ICD-10-CM | POA: Diagnosis not present

## 2012-10-17 DIAGNOSIS — D631 Anemia in chronic kidney disease: Secondary | ICD-10-CM | POA: Diagnosis not present

## 2012-10-17 DIAGNOSIS — N186 End stage renal disease: Secondary | ICD-10-CM | POA: Diagnosis not present

## 2012-10-17 DIAGNOSIS — E875 Hyperkalemia: Secondary | ICD-10-CM | POA: Diagnosis not present

## 2012-10-20 DIAGNOSIS — N186 End stage renal disease: Secondary | ICD-10-CM | POA: Diagnosis not present

## 2012-10-20 DIAGNOSIS — D631 Anemia in chronic kidney disease: Secondary | ICD-10-CM | POA: Diagnosis not present

## 2012-10-20 DIAGNOSIS — N2581 Secondary hyperparathyroidism of renal origin: Secondary | ICD-10-CM | POA: Diagnosis not present

## 2012-10-20 DIAGNOSIS — E875 Hyperkalemia: Secondary | ICD-10-CM | POA: Diagnosis not present

## 2012-10-22 DIAGNOSIS — N2581 Secondary hyperparathyroidism of renal origin: Secondary | ICD-10-CM | POA: Diagnosis not present

## 2012-10-22 DIAGNOSIS — E875 Hyperkalemia: Secondary | ICD-10-CM | POA: Diagnosis not present

## 2012-10-22 DIAGNOSIS — N186 End stage renal disease: Secondary | ICD-10-CM | POA: Diagnosis not present

## 2012-10-22 DIAGNOSIS — D631 Anemia in chronic kidney disease: Secondary | ICD-10-CM | POA: Diagnosis not present

## 2012-10-24 DIAGNOSIS — E875 Hyperkalemia: Secondary | ICD-10-CM | POA: Diagnosis not present

## 2012-10-24 DIAGNOSIS — D631 Anemia in chronic kidney disease: Secondary | ICD-10-CM | POA: Diagnosis not present

## 2012-10-24 DIAGNOSIS — N186 End stage renal disease: Secondary | ICD-10-CM | POA: Diagnosis not present

## 2012-10-24 DIAGNOSIS — N2581 Secondary hyperparathyroidism of renal origin: Secondary | ICD-10-CM | POA: Diagnosis not present

## 2012-10-27 DIAGNOSIS — D631 Anemia in chronic kidney disease: Secondary | ICD-10-CM | POA: Diagnosis not present

## 2012-10-27 DIAGNOSIS — N2581 Secondary hyperparathyroidism of renal origin: Secondary | ICD-10-CM | POA: Diagnosis not present

## 2012-10-27 DIAGNOSIS — E875 Hyperkalemia: Secondary | ICD-10-CM | POA: Diagnosis not present

## 2012-10-27 DIAGNOSIS — N186 End stage renal disease: Secondary | ICD-10-CM | POA: Diagnosis not present

## 2012-10-29 DIAGNOSIS — D631 Anemia in chronic kidney disease: Secondary | ICD-10-CM | POA: Diagnosis not present

## 2012-10-29 DIAGNOSIS — N2581 Secondary hyperparathyroidism of renal origin: Secondary | ICD-10-CM | POA: Diagnosis not present

## 2012-10-29 DIAGNOSIS — E875 Hyperkalemia: Secondary | ICD-10-CM | POA: Diagnosis not present

## 2012-10-29 DIAGNOSIS — N186 End stage renal disease: Secondary | ICD-10-CM | POA: Diagnosis not present

## 2012-10-31 DIAGNOSIS — D631 Anemia in chronic kidney disease: Secondary | ICD-10-CM | POA: Diagnosis not present

## 2012-10-31 DIAGNOSIS — N186 End stage renal disease: Secondary | ICD-10-CM | POA: Diagnosis not present

## 2012-10-31 DIAGNOSIS — N2581 Secondary hyperparathyroidism of renal origin: Secondary | ICD-10-CM | POA: Diagnosis not present

## 2012-10-31 DIAGNOSIS — E875 Hyperkalemia: Secondary | ICD-10-CM | POA: Diagnosis not present

## 2012-11-03 DIAGNOSIS — N2581 Secondary hyperparathyroidism of renal origin: Secondary | ICD-10-CM | POA: Diagnosis not present

## 2012-11-03 DIAGNOSIS — D631 Anemia in chronic kidney disease: Secondary | ICD-10-CM | POA: Diagnosis not present

## 2012-11-03 DIAGNOSIS — N186 End stage renal disease: Secondary | ICD-10-CM | POA: Diagnosis not present

## 2012-11-03 DIAGNOSIS — E875 Hyperkalemia: Secondary | ICD-10-CM | POA: Diagnosis not present

## 2012-11-05 DIAGNOSIS — N2581 Secondary hyperparathyroidism of renal origin: Secondary | ICD-10-CM | POA: Diagnosis not present

## 2012-11-05 DIAGNOSIS — N186 End stage renal disease: Secondary | ICD-10-CM | POA: Diagnosis not present

## 2012-11-05 DIAGNOSIS — E875 Hyperkalemia: Secondary | ICD-10-CM | POA: Diagnosis not present

## 2012-11-05 DIAGNOSIS — D631 Anemia in chronic kidney disease: Secondary | ICD-10-CM | POA: Diagnosis not present

## 2012-11-07 DIAGNOSIS — D631 Anemia in chronic kidney disease: Secondary | ICD-10-CM | POA: Diagnosis not present

## 2012-11-07 DIAGNOSIS — N186 End stage renal disease: Secondary | ICD-10-CM | POA: Diagnosis not present

## 2012-11-07 DIAGNOSIS — E875 Hyperkalemia: Secondary | ICD-10-CM | POA: Diagnosis not present

## 2012-11-07 DIAGNOSIS — N2581 Secondary hyperparathyroidism of renal origin: Secondary | ICD-10-CM | POA: Diagnosis not present

## 2012-11-10 DIAGNOSIS — N2581 Secondary hyperparathyroidism of renal origin: Secondary | ICD-10-CM | POA: Diagnosis not present

## 2012-11-10 DIAGNOSIS — D631 Anemia in chronic kidney disease: Secondary | ICD-10-CM | POA: Diagnosis not present

## 2012-11-10 DIAGNOSIS — N186 End stage renal disease: Secondary | ICD-10-CM | POA: Diagnosis not present

## 2012-11-10 DIAGNOSIS — E875 Hyperkalemia: Secondary | ICD-10-CM | POA: Diagnosis not present

## 2012-11-11 DIAGNOSIS — N186 End stage renal disease: Secondary | ICD-10-CM | POA: Diagnosis not present

## 2012-11-12 ENCOUNTER — Ambulatory Visit (INDEPENDENT_AMBULATORY_CARE_PROVIDER_SITE_OTHER): Payer: Medicare Other | Admitting: Nurse Practitioner

## 2012-11-12 ENCOUNTER — Telehealth: Payer: Self-pay | Admitting: *Deleted

## 2012-11-12 ENCOUNTER — Encounter: Payer: Self-pay | Admitting: Nurse Practitioner

## 2012-11-12 VITALS — BP 114/60 | HR 100 | Ht 72.5 in | Wt 248.0 lb

## 2012-11-12 DIAGNOSIS — K922 Gastrointestinal hemorrhage, unspecified: Secondary | ICD-10-CM

## 2012-11-12 DIAGNOSIS — K921 Melena: Secondary | ICD-10-CM | POA: Insufficient documentation

## 2012-11-12 DIAGNOSIS — Z23 Encounter for immunization: Secondary | ICD-10-CM | POA: Diagnosis not present

## 2012-11-12 DIAGNOSIS — N2581 Secondary hyperparathyroidism of renal origin: Secondary | ICD-10-CM | POA: Diagnosis not present

## 2012-11-12 DIAGNOSIS — D649 Anemia, unspecified: Secondary | ICD-10-CM

## 2012-11-12 DIAGNOSIS — K648 Other hemorrhoids: Secondary | ICD-10-CM

## 2012-11-12 DIAGNOSIS — N186 End stage renal disease: Secondary | ICD-10-CM | POA: Diagnosis not present

## 2012-11-12 DIAGNOSIS — E875 Hyperkalemia: Secondary | ICD-10-CM | POA: Diagnosis not present

## 2012-11-12 DIAGNOSIS — D631 Anemia in chronic kidney disease: Secondary | ICD-10-CM | POA: Diagnosis not present

## 2012-11-12 MED ORDER — OMEPRAZOLE 20 MG PO CPDR: 20.0000 mg | DELAYED_RELEASE_CAPSULE | Freq: Two times a day (BID) | ORAL | Status: DC

## 2012-11-12 MED ORDER — HYDROCORTISONE ACETATE 25 MG RE SUPP: 25.0000 mg | Freq: Two times a day (BID) | RECTAL | Status: DC

## 2012-11-12 NOTE — Telephone Encounter (Signed)
Dr Lorrene Reid called from the Dialysis Center to ask for an appt for the pt. Pt is a Restaurant manager, fast food. Pt had COLON by Dr Deatra Ina in 2012 showing Diverticulosis only. Two weeks ago, pt reports passing blood in his stool. Yesterday or this am?, the toilet bowl was filled with BRB. Pt takes ASA and Ibuprofen and has been instructed to decrease the use of NSAIDS by Dr Lorrene Reid. Pt's HGB has dropped from 11 to 10.4 since last Thursday. DR Lorrene Reid HAS HELD HEPARIN today.  Pt will see Tye Savoy, NP today at 1:30pm.

## 2012-11-12 NOTE — Patient Instructions (Addendum)
Please stop all antiinflammatory medications.  We have sent the following medications to your pharmacy for you to pick up at your convenience: have increased your Omeprazole to 40 mg twice a day. You will take one 30 minutes before breakfast and 1 before dinner.  Suppositories, use 1 suppository twice a day for 10 days.  If you are having an increase in bleeding and/or shortness of breath please go to your nearest E.R                                               We are excited to introduce MyChart, a new best-in-class service that provides you online access to important information in your electronic medical record. We want to make it easier for you to view your health information - all in one secure location - when and where you need it. We expect MyChart will enhance the quality of care and service we provide.  When you register for MyChart, you can:    View your test results.    Request appointments and receive appointment reminders via email.    Request medication renewals.    View your medical history, allergies, medications and immunizations.    Communicate with your physician's office through a password-protected site.    Conveniently print information such as your medication lists.  To find out if MyChart is right for you, please talk to a member of our clinical staff today. We will gladly answer your questions about this free health and wellness tool.  If you are age 80 or older and want a member of your family to have access to your record, you must provide written consent by completing a proxy form available at our office. Please speak to our clinical staff about guidelines regarding accounts for patients younger than age 50.  As you activate your MyChart account and need any technical assistance, please call the MyChart technical support line at [phone]-CHART [phone]) or email your question to [email or web address]. If you email your question(s), please include  your name, a return phone number and the best time to reach you.  If you have non-urgent health-related questions, you can send a message to our office through [place] at [email or web address]. If you have a medical emergency, call 911.  Thank you for using MyChart as your new health and wellness resource!   MyChart licensed from [employer],  1999-2010. Patents Pending.

## 2012-11-12 NOTE — Progress Notes (Signed)
History of Present Illness:  Patient is a 52 year old male known to Dr. Deatra Ina. He had a screening colonoscopy in 2012 with findings of only  diverticulosis. Patient is worked in today at the request of his nephrologist for evaluation of rectal bleeding. One week ago patient saw dark red blood covering his stool. Over the following few days he had some minor rectal bleeding. Today after "rumbling" in his stomach patient had a loose brown BM with dark red blood. No abdominal pain or rectal pain. He hasn't been lightheaded or weak. Per nephrology clinic his hemoglobin has apparently dropped from 11 to 10.4 over the last six days. Patient does take a daily ASA and uses Motrin several times a week for headaches. No nausea. No upper abdominal pain. No black stools. He is on chronic PPI therapy  Current Medications, Allergies, Past Medical History, Past Surgical History, Family History and Social History were reviewed in [employer] record.   Physical Exam: General: Pleasant,well developed , black male in no acute distress Head: Normocephalic and atraumatic Eyes:  sclerae anicteric, conjunctiva pink  Ears: Normal auditory acuity Lungs: Clear throughout to auscultation Heart: Regular rate and rhythm Abdomen: Soft, non distended, non-tender. No masses, no hepatomegaly. Normal bowel sounds Rectal: Small amount of dark red stool in vault on DRE. Internal hemorrhoids on anoscopic view. Musculoskeletal: Symmetrical with no gross deformities  Extremities: No edema  Neurological: Alert oriented x 4, grossly nonfocal Psychological:  Alert and cooperative. Normal mood and affect  Assessment and Recommendations:  1. Pleasant 52 year old black male with intermittent painless rectal bleeding (dark red) over the last week in setting of chronic NSAID use.  Patient is slightly tachycardic at 100 but BP stable. Patient looks / feels okay. Not clear if this is upper or lower bleed. Some  considerations include PUD, AVM, diverticular hemorrhage, hemorrhoidal bleeding. Doubt ischemic in absence of pain. Discussed patient with Dr. Ardis Hughs. Plan :  Nephrology has discontinued all NSAIDs.   I am increasing PPI to 40mg  BID.   Will treat internal hemorrhoids with steroid suppositories.   My nurse will ask dialysis center for repeat CBC in two days (or he can come here to get it).  Instructed patient to call our office or go to ED if he feels weak, short of breath or has any further episodes of significant bleeding. Otherwise, will call patient tomorrow for condition update.   2. Jehovahs Witness, will not take blood  3. ESRD, on hemodialysis

## 2012-11-13 NOTE — Progress Notes (Signed)
I agree with the plan above 

## 2012-11-14 ENCOUNTER — Telehealth: Payer: Self-pay | Admitting: *Deleted

## 2012-11-14 ENCOUNTER — Telehealth: Payer: Self-pay | Admitting: Gastroenterology

## 2012-11-14 NOTE — Telephone Encounter (Signed)
Great news !!. [name], lets keep him under close watch as I am not [phone] certain that this was just hemorrhoidal bleeding. Sometime next week will you call him  And and make sure bleeding hasn't recurred. Please get him an appointment with Deatra Ina in about 3 weeks. He should continue BID PPI until appointment. Thanks

## 2012-11-14 NOTE — Telephone Encounter (Signed)
Nurse from the kidney center called to give pt's cbc; hgb was 10.6 two days ago and today it's 10.2

## 2012-11-14 NOTE — Telephone Encounter (Signed)
Per Tye Savoy, NP , call and see how patient is doing.?bleeding? How feeling?

## 2012-11-14 NOTE — Telephone Encounter (Signed)
Okay, this doesn't represent a major drop in hgb. Regina to call and see how patient feels and if still passing any blood in stools.

## 2012-11-14 NOTE — Telephone Encounter (Signed)
Spoke with patient and he is feeling better. No bleeding and stools are lighter in color.

## 2012-11-17 ENCOUNTER — Telehealth: Payer: Self-pay | Admitting: *Deleted

## 2012-11-17 NOTE — Telephone Encounter (Signed)
Willia Craze, NP at 11/14/2012 3:11 PM    Status: Signed             Great news !!. Rollene Fare, lets keep him under close watch as I am not [phone] certain that this was just hemorrhoidal bleeding. Sometime next week will you call him And  and make sure bleeding hasn't recurred. Please get him an appointment with Deatra Ina in about 3 weeks. He should continue BID PPI until appointment. Thanks       Left a message for patient to call me.

## 2012-11-18 NOTE — Telephone Encounter (Signed)
Spoke with patient and he states he is doing well. Denies any bleeding. Scheduled OV with Dr. Deatra Ina on 12/15/12 at 2:30 PM.

## 2012-11-18 NOTE — Telephone Encounter (Signed)
Left a message for patient to call me. 

## 2012-11-30 ENCOUNTER — Emergency Department (INDEPENDENT_AMBULATORY_CARE_PROVIDER_SITE_OTHER): Payer: Medicare Other

## 2012-11-30 ENCOUNTER — Emergency Department (INDEPENDENT_AMBULATORY_CARE_PROVIDER_SITE_OTHER)
Admission: EM | Admit: 2012-11-30 | Discharge: 2012-11-30 | Disposition: A | Discharge status: Home / Self Care | Payer: Medicare Other | Source: Home / Self Care | Attending: Emergency Medicine | Admitting: Emergency Medicine

## 2012-11-30 ENCOUNTER — Encounter (HOSPITAL_COMMUNITY): Payer: Self-pay | Admitting: Emergency Medicine

## 2012-11-30 DIAGNOSIS — M1990 Unspecified osteoarthritis, unspecified site: Secondary | ICD-10-CM

## 2012-11-30 DIAGNOSIS — IMO0002 Reserved for concepts with insufficient information to code with codable children: Secondary | ICD-10-CM | POA: Diagnosis not present

## 2012-11-30 DIAGNOSIS — M1710 Unilateral primary osteoarthritis, unspecified knee: Secondary | ICD-10-CM | POA: Diagnosis not present

## 2012-11-30 DIAGNOSIS — M25469 Effusion, unspecified knee: Secondary | ICD-10-CM | POA: Diagnosis not present

## 2012-11-30 HISTORY — DX: Dependence on renal dialysis: Z99.2

## 2012-11-30 MED ORDER — METHYLPREDNISOLONE ACETATE 80 MG/ML IJ SUSP
INTRAMUSCULAR | Status: AC
  Filled 2012-11-30: qty 1

## 2012-11-30 MED ORDER — TRAMADOL HCL 50 MG PO TABS: 100.0000 mg | ORAL_TABLET | Freq: Three times a day (TID) | ORAL | Status: DC | PRN

## 2012-11-30 MED ORDER — METHYLPREDNISOLONE ACETATE 80 MG/ML IJ SUSP
80.0000 mg | Freq: Once | INTRAMUSCULAR | Status: AC
  Administered 2012-11-30: 80 mg via INTRAMUSCULAR

## 2012-11-30 MED ORDER — PREDNISONE 20 MG PO TABS: ORAL_TABLET | ORAL | Status: DC

## 2012-11-30 NOTE — ED Provider Notes (Signed)
Chief Complaint:   Chief Complaint  Patient presents with  . Knee Pain    History of Present Illness:   [name] is a 52 year old male who has had a two-day history of right knee pain and swelling. The knee pain is located immediately. He denies any injury, but the pain came on after he pushed mouth his lawn. It hurts to stand and walk and go up and down stairs. He's had no prior history of knee problems or arthritis in the past. He denies any fever, chills, or history of gout.  Review of Systems:  Other than noted above, the patient denies any of the following symptoms: Systemic:  No fevers, chills, sweats, or aches.  No fatigue or tiredness. Musculoskeletal:  No joint pain, arthritis, bursitis, swelling, back pain, or neck pain.  Neurological:  No muscular weakness, paresthesias, headache, or trouble with speech or coordination.  No dizziness.  [place]:  Past medical history, family history, social history, meds, and allergies were reviewed.  No prior history of knee pain or arthritis. He has chronic kidney disease and is on dialysis. He's allergic to Vicodin.  Physical Exam:   Vital signs:  BP 129/81  Pulse 76  Temp(Src) 98 F (36.7 C) (Oral)  Resp 18  SpO2 99% Gen:  Alert and oriented times 3.  In no distress. Musculoskeletal: The patella was ballotable. There was pain to palpation over the medial aspect of the patella. No pain over the medial joint line. The knee had a full range of motion with no crepitus and with pain with flexion beyond 90.   McMurray's test was negative.  Lachman's test was negative.  Anterior drawer test was negative.   Varus and valgus stress negative.  Otherwise, all joints had a full a ROM with no swelling, bruising or deformity.  No edema, pulses full. Extremities were warm and pink.  Capillary refill was brisk.  Skin:  Clear, warm and dry.  No rash. Neuro:  Alert and oriented times 3.  Muscle strength was normal.  Sensation was intact to light touch.    Radiology:  Dg Knee Complete 4 Views Right  11/30/2012   CLINICAL DATA:  Knee pain.  No known injury.  EXAM: RIGHT KNEE - COMPLETE 4+ VIEW  COMPARISON:  None.  FINDINGS: No fracture or dislocation.  There are degenerative changes with narrowing of the patellofemoral joint space compartment, marginal osteophytes from the medial compartment and patella and spurring from the tibial spine. Acquired bony fusion extends between the lateral tibia and the fibula.  There is a moderate joint effusion.  Mild vascular calcifications are noted posteriorly.  IMPRESSION: 1. No fracture or acute finding. 2. Degenerative changes involving the medial and patellofemoral joint space compartments with an associated moderate joint effusion.   Electronically Signed   By: Lajean Manes M.D.   On: 11/30/2012 10:37   I reviewed the images independently and personally and concur with the radiologist's findings.   Course in Urgent Care Center:   Given Depo-Medrol 80 mg IM since he has chronic kidney disease and did not want to give her any nonsteroidal anti-inflammatory drugs.  Assessment:  The encounter diagnosis was Osteoarthritis.  Probable osteoarthritis with effusion, brought on by positional in his yard.  Plan:   1.  Meds:  The following meds were prescribed:   Discharge Medication List as of 11/30/2012 11:31 AM    START taking these medications   Details  predniSONE (DELTASONE) 20 MG tablet 3 daily for 4 days,  2 daily for 4 days, 1 daily for 4 days., Normal    traMADol (ULTRAM) 50 MG tablet Take 2 tablets (100 mg total) by mouth every 8 (eight) hours as needed for pain., Starting 11/30/2012, Until Discontinued, Normal        2.  Patient Education/Counseling:  The patient was given appropriate handouts, self care instructions, and instructed in symptomatic relief, including rest and activity, elevation, application of ice and compression.   3.  Follow up:  The patient was told to follow up if no better in 3  to 4 days, if becoming worse in any way, and given some red flag symptoms such as worsening pain which would prompt immediate return.  Follow up here as needed.     Harden Mo, MD 11/30/12 2232

## 2012-11-30 NOTE — ED Notes (Signed)
C/O right knee "feeling funny" yesterday.  Today has some swelling, medial knee pain, and difficulty bending knee.  Denies any injuries.  Has elevated and applied alcohol to area.

## 2012-12-03 DIAGNOSIS — E1129 Type 2 diabetes mellitus with other diabetic kidney complication: Secondary | ICD-10-CM | POA: Diagnosis not present

## 2012-12-09 ENCOUNTER — Ambulatory Visit: Payer: Medicare Other | Attending: Internal Medicine | Admitting: Internal Medicine

## 2012-12-09 VITALS — BP 139/89 | HR 83 | Temp 98.8°F | Resp 17 | Wt 264.8 lb

## 2012-12-09 DIAGNOSIS — N186 End stage renal disease: Secondary | ICD-10-CM | POA: Insufficient documentation

## 2012-12-09 DIAGNOSIS — I10 Essential (primary) hypertension: Secondary | ICD-10-CM | POA: Insufficient documentation

## 2012-12-09 DIAGNOSIS — Z0000 Encounter for general adult medical examination without abnormal findings: Secondary | ICD-10-CM | POA: Diagnosis not present

## 2012-12-09 DIAGNOSIS — Z01818 Encounter for other preprocedural examination: Secondary | ICD-10-CM

## 2012-12-09 DIAGNOSIS — K219 Gastro-esophageal reflux disease without esophagitis: Secondary | ICD-10-CM

## 2012-12-09 DIAGNOSIS — I12 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 stage renal disease: Secondary | ICD-10-CM | POA: Insufficient documentation

## 2012-12-09 DIAGNOSIS — E119 Type 2 diabetes mellitus without complications: Secondary | ICD-10-CM

## 2012-12-09 NOTE — Progress Notes (Signed)
Patient ID: [name], male   DOB: 20-Nov-1960, 52 y.o.   MRN: [id number]  CC: New patient  HPI: 52 year old male with past medical history of end-stage renal disease on hemodialysis, hypertension, neuropathy and who presents to clinic to establish primary care physician. Patient has no current complaints. He has right arm fistula which is working properly. No complaints of lower extremity swelling or shortness of breath. No chest pains. No fevers.  Allergies  Allergen Reactions  . Infed [Iron Dextran]     Decreased BP   . Oxycodone Nausea Only  . Vicodin [Hydrocodone-Acetaminophen] Other (See Comments)    Decreased BP   Past Medical History  Diagnosis Date  . Hypertension   . Anemia   . Chronic headaches   . Sleep apnea   . Chronic kidney disease     Chronic Renal Failure; On Renal Transplant List  . Presence of arteriovenous fistula for hemodialysis, primary     RUE  . Hemodialysis patient     M-W-F   Current Outpatient Prescriptions on File Prior to Visit  Medication Sig Dispense Refill  . amLODipine (NORVASC) 10 MG tablet Take 10 mg by mouth daily.        Marland Kitchen gabapentin (NEURONTIN) 300 MG capsule Take 300 mg by mouth 2 (two) times daily.        . hydrocortisone (ANUSOL-HC) 25 MG suppository Place 1 suppository (25 mg total) rectally 2 (two) times daily.  20 suppository  0  . lanthanum (FOSRENOL) 1000 MG chewable tablet Chew 1,000 mg by mouth 3 (three) times daily with meals.        . Multiple Vitamin (MULTI VITAMIN MENS PO) Take 1 tablet by mouth daily.        Marland Kitchen omeprazole (PRILOSEC) 20 MG capsule Take 1 capsule (20 mg total) by mouth 2 (two) times daily.  60 capsule  3  . predniSONE (DELTASONE) 20 MG tablet 3 daily for 4 days, 2 daily for 4 days, 1 daily for 4 days.  24 tablet  0  . Sorbitol SOLN by Does not apply route. 30 to 60 ml as needed for constipation       . traMADol (ULTRAM) 50 MG tablet Take 2 tablets (100 mg total) by mouth every 8 (eight) hours as needed for  pain.  30 tablet  0   Current Facility-Administered Medications on File Prior to Visit  Medication Dose Route Frequency Provider Last Rate Last Dose  . 0.9 %  sodium chloride infusion  500 mL Intravenous Continuous Inda Castle, MD       Family History  Problem Relation Age of Onset  . Diabetes Father   . Uterine cancer Mother   . Stroke Father   . Hypertension Father   . Lupus Sister   . Stroke Sister   . Anuerysm Brother     brain  . Hypertension Sister    History   Social History  . Marital Status: Single    Spouse Name: N/A    Number of Children: 2  . Years of Education: N/A   Occupational History  . retired    Social History Main Topics  . Smoking status: Former Smoker    Types: Cigars    Quit date: 06/19/1990  . Smokeless tobacco: Not on file  . Alcohol Use: 0.0 oz/week     Comment: occasional  . Drug Use: No  . Sexual Activity: Not on file   Other Topics Concern  . Not on file  Social History Narrative  . No narrative on file    Review of Systems  Constitutional: Negative for fever, chills, diaphoresis, activity change, appetite change and fatigue.  HENT: Negative for ear pain, nosebleeds, congestion, facial swelling, rhinorrhea, neck pain, neck stiffness and ear discharge.   Eyes: Negative for pain, discharge, redness, itching and visual disturbance.  Respiratory: Negative for cough, choking, chest tightness, shortness of breath, wheezing and stridor.   Cardiovascular: Negative for chest pain, palpitations and leg swelling.  Gastrointestinal: Negative for abdominal distention.  Genitourinary: Negative for dysuria, urgency, frequency, hematuria, flank pain, decreased urine volume, difficulty urinating and dyspareunia.  Musculoskeletal: Negative for back pain, joint swelling, arthralgias and gait problem.  Neurological: Negative for dizziness, tremors, seizures, syncope, facial asymmetry, speech difficulty, weakness, light-headedness, numbness and  headaches.  Hematological: Negative for adenopathy. Does not bruise/bleed easily.  Psychiatric/Behavioral: Negative for hallucinations, behavioral problems, confusion, dysphoric mood, decreased concentration and agitation.    Objective:   Filed Vitals:   12/09/12 1157  BP: 139/89  Pulse: 83  Temp: 98.8 F (37.1 C)  Resp: 17    Physical Exam  Constitutional: Appears well-developed and well-nourished. No distress.  HENT: Normocephalic. External right and left ear normal. Oropharynx is clear and moist.  Eyes: Conjunctivae and EOM are normal. PERRLA, no scleral icterus.  Neck: Normal ROM. Neck supple. No JVD. No tracheal deviation. No thyromegaly.  CVS: RRR, S1/S2 +, no murmurs, no gallops, no carotid bruit.  Pulmonary: Effort and breath sounds normal, no stridor, rhonchi, wheezes, rales.  Abdominal: Soft. BS +,  no distension, tenderness, rebound or guarding.  Musculoskeletal: Normal range of motion. No edema and no tenderness.  Lymphadenopathy: No lymphadenopathy noted, cervical, inguinal. Neuro: Alert. Normal reflexes, muscle tone coordination. No cranial nerve deficit. Skin: Skin is warm and dry. No rash noted. Fistula - right forearm Psychiatric: Normal mood and affect. Behavior, judgment, thought content normal.   Lab Results  Component Value Date   WBC 8.1 03/26/2009   HGB 10.8* 03/26/2009   HCT 31.5* 03/26/2009   MCV 92.9 03/26/2009   PLT 203 03/26/2009   Lab Results  Component Value Date   CREATININE 9.81* 03/26/2009   BUN 57* 03/26/2009   NA 137 03/26/2009   K 5.5 DELTA CHECK NOTED NO VISIBLE HEMOLYSIS* 03/26/2009   CL 101 03/26/2009   CO2 26 03/26/2009    No results found for this basename: HGBA1C   Lipid Panel  No results found for this basename: chol, trig, hdl, cholhdl, vldl, ldlcalc       Assessment and plan:   Patient Active Problem List   Diagnosis Date Noted  . HTN (hypertension) 12/09/2012    Priority: Medium - We have discussed target BP range - I  have advised pt to check BP regularly and to call us back if the numbers are higher than 140/90 - discussed the importance of compliance with medical therapy and diet  - acceptable BP today - continue current meds  . GERD (gastroesophageal reflux disease) 12/09/2012    Priority: Medium - continue omeprazole  . ESRD on dialysis 12/09/2012    Priority: Medium - follows with renal; has all prescriptions  . Preventive health - check A1c, TSH, lipid panel, RPR , HIV, acute hepatitis panel 11/12/2012

## 2012-12-09 NOTE — Patient Instructions (Signed)
Hypertension As your heart beats, it forces blood through your arteries. This force is your blood pressure. If the pressure is too high, it is called hypertension (HTN) or high blood pressure. HTN is dangerous because you may have it and not know it. High blood pressure may mean that your heart has to work harder to pump blood. Your arteries may be narrow or stiff. The extra work puts you at risk for heart disease, stroke, and other problems.  Blood pressure consists of two numbers, a higher number over a lower, 110/72, for example. It is stated as "110 over 72." The ideal is below 120 for the top number (systolic) and under 80 for the bottom (diastolic). Write down your blood pressure today. You should pay close attention to your blood pressure if you have certain conditions such as:  Heart failure.  Prior heart attack.  Diabetes  Chronic kidney disease.  Prior stroke.  Multiple risk factors for heart disease. To see if you have HTN, your blood pressure should be measured while you are seated with your arm held at the level of the heart. It should be measured at least twice. A one-time elevated blood pressure reading (especially in the Emergency Department) does not mean that you need treatment. There may be conditions in which the blood pressure is different between your right and left arms. It is important to see your caregiver soon for a recheck. Most people have essential hypertension which means that there is not a specific cause. This type of high blood pressure may be lowered by changing lifestyle factors such as:  Stress.  Smoking.  Lack of exercise.  Excessive weight.  Drug/tobacco/alcohol use.  Eating less salt. Most people do not have symptoms from high blood pressure until it has caused damage to the body. Effective treatment can often prevent, delay or reduce that damage. TREATMENT  When a cause has been identified, treatment for high blood pressure is directed at the  cause. There are a large number of medications to treat HTN. These fall into several categories, and your caregiver will help you select the medicines that are best for you. Medications may have side effects. You should review side effects with your caregiver. If your blood pressure stays high after you have made lifestyle changes or started on medicines,   Your medication(s) may need to be changed.  Other problems may need to be addressed.  Be certain you understand your prescriptions, and know how and when to take your medicine.  Be sure to follow up with your caregiver within the time frame advised (usually within two weeks) to have your blood pressure rechecked and to review your medications.  If you are taking more than one medicine to lower your blood pressure, make sure you know how and at what times they should be taken. Taking two medicines at the same time can result in blood pressure that is too low. SEEK IMMEDIATE MEDICAL CARE IF:  You develop a severe headache, blurred or changing vision, or confusion.  You have unusual weakness or numbness, or a faint feeling.  You have severe chest or abdominal pain, vomiting, or breathing problems. MAKE SURE YOU:   Understand these instructions.  Will watch your condition.  Will get help right away if you are not doing well or get worse. Document Released: 01/29/2005 Document Revised: 04/23/2011 Document Reviewed: 09/19/2007 Exit[employer] Patient Information 2014 [employer].  

## 2012-12-09 NOTE — Progress Notes (Signed)
Patient has history of renal disease Does dialysis three times a week Presently on transplant list Here to establish care

## 2012-12-09 NOTE — Addendum Note (Signed)
Addended by: Robbie Lis on: 12/09/2012 12:20 PM   Modules accepted: Orders

## 2012-12-10 ENCOUNTER — Telehealth: Payer: Self-pay | Admitting: Internal Medicine

## 2012-12-10 LAB — BASIC METABOLIC PANEL
BUN: 82 mg/dL — ABNORMAL HIGH (ref 6–23)
CO2: 24 mEq/L (ref 19–32)
Calcium: 9.1 mg/dL (ref 8.4–10.5)
Chloride: 100 mEq/L (ref 96–112)
Creat: 11.39 mg/dL (ref 0.50–1.35)
Glucose, Bld: 88 mg/dL (ref 70–99)
Potassium: 4.7 mEq/L (ref 3.5–5.3)
Sodium: 140 mEq/L (ref 135–145)

## 2012-12-10 LAB — TSH: TSH: 1.416 u[IU]/mL (ref 0.350–4.500)

## 2012-12-10 LAB — LIPID PANEL
Cholesterol: 160 mg/dL (ref 0–200)
HDL: 67 mg/dL (ref >39)
LDL Cholesterol: 85 mg/dL (ref 0–99)
Total CHOL/HDL Ratio: 2.4 Ratio
Triglycerides: 41 mg/dL (ref <150)
VLDL: 8 mg/dL (ref 0–40)

## 2012-12-10 LAB — RPR

## 2012-12-10 NOTE — Telephone Encounter (Signed)
[facility] called at 1:39a to report a critical lab value of high Creatnine of 11.39. Dr.Jegede was notified and is to follow up with the patient.

## 2012-12-10 NOTE — Telephone Encounter (Signed)
Called patient's emergency number after 3 attempts on his cell phone, discussed with sister, she said patient has dialysis in the morning (6 am). I advised that patient goes to the emergency department for dialysis if symptomatic or go for his regular scheduled dialysis otherwise. I gave her my cell phone for a call back from [name] when reached.

## 2012-12-12 DIAGNOSIS — N186 End stage renal disease: Secondary | ICD-10-CM | POA: Diagnosis not present

## 2012-12-15 ENCOUNTER — Ambulatory Visit (INDEPENDENT_AMBULATORY_CARE_PROVIDER_SITE_OTHER): Payer: Medicare Other | Admitting: Gastroenterology

## 2012-12-15 ENCOUNTER — Encounter: Payer: Self-pay | Admitting: Gastroenterology

## 2012-12-15 VITALS — BP 90/74 | HR 104 | Ht 72.5 in | Wt 255.5 lb

## 2012-12-15 DIAGNOSIS — E875 Hyperkalemia: Secondary | ICD-10-CM | POA: Diagnosis not present

## 2012-12-15 DIAGNOSIS — K922 Gastrointestinal hemorrhage, unspecified: Secondary | ICD-10-CM

## 2012-12-15 DIAGNOSIS — D631 Anemia in chronic kidney disease: Secondary | ICD-10-CM | POA: Diagnosis not present

## 2012-12-15 DIAGNOSIS — N186 End stage renal disease: Secondary | ICD-10-CM | POA: Diagnosis not present

## 2012-12-15 DIAGNOSIS — N2581 Secondary hyperparathyroidism of renal origin: Secondary | ICD-10-CM | POA: Diagnosis not present

## 2012-12-15 NOTE — Patient Instructions (Signed)
It is permissible to resume heparinization during dialysis treatment

## 2012-12-15 NOTE — Progress Notes (Signed)
History of Present Illness:  The pt has returned for followup of rectal bleeding.  Since his last visit he has had no recurrences.  He was placed on Anusol a.c. suppositories for presumed hemorrhoidal bleeding.  He denies rectal discomfort or change in bowel habits.  Hemoglobin on October 22 was 9.5.  Heparin has been held and NSAIDs have been discontinued.    Review of Systems: Pertinent positive and negative review of systems were noted in the above HPI section. All other review of systems were otherwise negative.    Current Medications, Allergies, Past Medical History, Past Surgical History, Family History and Social History were reviewed in [facility] record  Vital signs were reviewed in today's medical record. Physical Exam: General: Well developed , well nourished, no acute distress

## 2012-12-15 NOTE — Assessment & Plan Note (Addendum)
Patient has had no further rectal bleeding.  Presumably this was due to hemorrhoidal bleeding.  Of note, the patient was taking NSAIDs regularly raising the question of occult erosions or ulcerations along the GI tract.  Recommendations #1 okay to resume heparinization during dialysis #2 continue to hold NSAIDs  #4 patient was instructed to contact me should he have recurrent rectal bleeding which point I would consider band ligation if this appears to be hemorrhoidal

## 2013-01-11 DIAGNOSIS — N186 End stage renal disease: Secondary | ICD-10-CM | POA: Diagnosis not present

## 2013-01-12 DIAGNOSIS — D631 Anemia in chronic kidney disease: Secondary | ICD-10-CM | POA: Diagnosis not present

## 2013-01-12 DIAGNOSIS — N186 End stage renal disease: Secondary | ICD-10-CM | POA: Diagnosis not present

## 2013-01-12 DIAGNOSIS — E875 Hyperkalemia: Secondary | ICD-10-CM | POA: Diagnosis not present

## 2013-01-12 DIAGNOSIS — N2581 Secondary hyperparathyroidism of renal origin: Secondary | ICD-10-CM | POA: Diagnosis not present

## 2013-01-15 DIAGNOSIS — R0602 Shortness of breath: Secondary | ICD-10-CM | POA: Insufficient documentation

## 2013-02-11 DIAGNOSIS — N186 End stage renal disease: Secondary | ICD-10-CM | POA: Diagnosis not present

## 2013-02-13 DIAGNOSIS — D631 Anemia in chronic kidney disease: Secondary | ICD-10-CM | POA: Diagnosis not present

## 2013-02-13 DIAGNOSIS — N2581 Secondary hyperparathyroidism of renal origin: Secondary | ICD-10-CM | POA: Diagnosis not present

## 2013-02-13 DIAGNOSIS — N039 Chronic nephritic syndrome with unspecified morphologic changes: Secondary | ICD-10-CM | POA: Diagnosis not present

## 2013-02-13 DIAGNOSIS — E875 Hyperkalemia: Secondary | ICD-10-CM | POA: Diagnosis not present

## 2013-02-13 DIAGNOSIS — N186 End stage renal disease: Secondary | ICD-10-CM | POA: Diagnosis not present

## 2013-03-11 ENCOUNTER — Ambulatory Visit: Payer: Medicare Other | Admitting: Internal Medicine

## 2013-03-11 DIAGNOSIS — E1129 Type 2 diabetes mellitus with other diabetic kidney complication: Secondary | ICD-10-CM | POA: Diagnosis not present

## 2013-03-14 DIAGNOSIS — N186 End stage renal disease: Secondary | ICD-10-CM | POA: Diagnosis not present

## 2013-03-16 DIAGNOSIS — E875 Hyperkalemia: Secondary | ICD-10-CM | POA: Diagnosis not present

## 2013-03-16 DIAGNOSIS — N186 End stage renal disease: Secondary | ICD-10-CM | POA: Diagnosis not present

## 2013-03-16 DIAGNOSIS — Z23 Encounter for immunization: Secondary | ICD-10-CM | POA: Diagnosis not present

## 2013-03-16 DIAGNOSIS — N2581 Secondary hyperparathyroidism of renal origin: Secondary | ICD-10-CM | POA: Diagnosis not present

## 2013-03-16 DIAGNOSIS — D631 Anemia in chronic kidney disease: Secondary | ICD-10-CM | POA: Diagnosis not present

## 2013-03-16 DIAGNOSIS — D509 Iron deficiency anemia, unspecified: Secondary | ICD-10-CM | POA: Diagnosis not present

## 2013-04-11 DIAGNOSIS — N186 End stage renal disease: Secondary | ICD-10-CM | POA: Diagnosis not present

## 2013-04-13 DIAGNOSIS — D509 Iron deficiency anemia, unspecified: Secondary | ICD-10-CM | POA: Diagnosis not present

## 2013-04-13 DIAGNOSIS — N039 Chronic nephritic syndrome with unspecified morphologic changes: Secondary | ICD-10-CM | POA: Diagnosis not present

## 2013-04-13 DIAGNOSIS — N186 End stage renal disease: Secondary | ICD-10-CM | POA: Diagnosis not present

## 2013-04-13 DIAGNOSIS — N2581 Secondary hyperparathyroidism of renal origin: Secondary | ICD-10-CM | POA: Diagnosis not present

## 2013-04-13 DIAGNOSIS — D631 Anemia in chronic kidney disease: Secondary | ICD-10-CM | POA: Diagnosis not present

## 2013-04-13 DIAGNOSIS — E875 Hyperkalemia: Secondary | ICD-10-CM | POA: Diagnosis not present

## 2013-05-12 ENCOUNTER — Other Ambulatory Visit: Payer: Self-pay | Admitting: Nurse Practitioner

## 2013-05-12 DIAGNOSIS — N186 End stage renal disease: Secondary | ICD-10-CM | POA: Diagnosis not present

## 2013-05-13 DIAGNOSIS — N039 Chronic nephritic syndrome with unspecified morphologic changes: Secondary | ICD-10-CM | POA: Diagnosis not present

## 2013-05-13 DIAGNOSIS — D509 Iron deficiency anemia, unspecified: Secondary | ICD-10-CM | POA: Diagnosis not present

## 2013-05-13 DIAGNOSIS — E875 Hyperkalemia: Secondary | ICD-10-CM | POA: Diagnosis not present

## 2013-05-13 DIAGNOSIS — N186 End stage renal disease: Secondary | ICD-10-CM | POA: Diagnosis not present

## 2013-05-13 DIAGNOSIS — E1129 Type 2 diabetes mellitus with other diabetic kidney complication: Secondary | ICD-10-CM | POA: Diagnosis not present

## 2013-05-13 DIAGNOSIS — D631 Anemia in chronic kidney disease: Secondary | ICD-10-CM | POA: Diagnosis not present

## 2013-06-03 DIAGNOSIS — E1129 Type 2 diabetes mellitus with other diabetic kidney complication: Secondary | ICD-10-CM | POA: Diagnosis not present

## 2013-06-11 DIAGNOSIS — N186 End stage renal disease: Secondary | ICD-10-CM | POA: Diagnosis not present

## 2013-06-12 DIAGNOSIS — E1129 Type 2 diabetes mellitus with other diabetic kidney complication: Secondary | ICD-10-CM | POA: Diagnosis not present

## 2013-06-12 DIAGNOSIS — E875 Hyperkalemia: Secondary | ICD-10-CM | POA: Diagnosis not present

## 2013-06-12 DIAGNOSIS — N186 End stage renal disease: Secondary | ICD-10-CM | POA: Diagnosis not present

## 2013-06-23 ENCOUNTER — Other Ambulatory Visit: Payer: Self-pay | Admitting: Gastroenterology

## 2013-06-26 DIAGNOSIS — H25019 Cortical age-related cataract, unspecified eye: Secondary | ICD-10-CM | POA: Diagnosis not present

## 2013-06-26 DIAGNOSIS — H47329 Drusen of optic disc, unspecified eye: Secondary | ICD-10-CM | POA: Diagnosis not present

## 2013-07-12 DIAGNOSIS — N186 End stage renal disease: Secondary | ICD-10-CM | POA: Diagnosis not present

## 2013-07-14 DIAGNOSIS — E1129 Type 2 diabetes mellitus with other diabetic kidney complication: Secondary | ICD-10-CM | POA: Diagnosis not present

## 2013-07-14 DIAGNOSIS — D509 Iron deficiency anemia, unspecified: Secondary | ICD-10-CM | POA: Diagnosis not present

## 2013-07-14 DIAGNOSIS — N186 End stage renal disease: Secondary | ICD-10-CM | POA: Diagnosis not present

## 2013-07-14 DIAGNOSIS — E875 Hyperkalemia: Secondary | ICD-10-CM | POA: Diagnosis not present

## 2013-07-15 DIAGNOSIS — E1129 Type 2 diabetes mellitus with other diabetic kidney complication: Secondary | ICD-10-CM | POA: Diagnosis not present

## 2013-07-15 DIAGNOSIS — E875 Hyperkalemia: Secondary | ICD-10-CM | POA: Diagnosis not present

## 2013-07-15 DIAGNOSIS — D509 Iron deficiency anemia, unspecified: Secondary | ICD-10-CM | POA: Diagnosis not present

## 2013-07-15 DIAGNOSIS — N186 End stage renal disease: Secondary | ICD-10-CM | POA: Diagnosis not present

## 2013-07-17 DIAGNOSIS — D509 Iron deficiency anemia, unspecified: Secondary | ICD-10-CM | POA: Diagnosis not present

## 2013-07-17 DIAGNOSIS — E1129 Type 2 diabetes mellitus with other diabetic kidney complication: Secondary | ICD-10-CM | POA: Diagnosis not present

## 2013-07-17 DIAGNOSIS — E875 Hyperkalemia: Secondary | ICD-10-CM | POA: Diagnosis not present

## 2013-07-17 DIAGNOSIS — N186 End stage renal disease: Secondary | ICD-10-CM | POA: Diagnosis not present

## 2013-07-20 DIAGNOSIS — E1129 Type 2 diabetes mellitus with other diabetic kidney complication: Secondary | ICD-10-CM | POA: Diagnosis not present

## 2013-07-20 DIAGNOSIS — E875 Hyperkalemia: Secondary | ICD-10-CM | POA: Diagnosis not present

## 2013-07-20 DIAGNOSIS — N186 End stage renal disease: Secondary | ICD-10-CM | POA: Diagnosis not present

## 2013-07-20 DIAGNOSIS — D509 Iron deficiency anemia, unspecified: Secondary | ICD-10-CM | POA: Diagnosis not present

## 2013-07-22 DIAGNOSIS — E875 Hyperkalemia: Secondary | ICD-10-CM | POA: Diagnosis not present

## 2013-07-22 DIAGNOSIS — E1129 Type 2 diabetes mellitus with other diabetic kidney complication: Secondary | ICD-10-CM | POA: Diagnosis not present

## 2013-07-22 DIAGNOSIS — N186 End stage renal disease: Secondary | ICD-10-CM | POA: Diagnosis not present

## 2013-07-22 DIAGNOSIS — D509 Iron deficiency anemia, unspecified: Secondary | ICD-10-CM | POA: Diagnosis not present

## 2013-07-24 DIAGNOSIS — N186 End stage renal disease: Secondary | ICD-10-CM | POA: Diagnosis not present

## 2013-07-24 DIAGNOSIS — E875 Hyperkalemia: Secondary | ICD-10-CM | POA: Diagnosis not present

## 2013-07-24 DIAGNOSIS — E1129 Type 2 diabetes mellitus with other diabetic kidney complication: Secondary | ICD-10-CM | POA: Diagnosis not present

## 2013-07-24 DIAGNOSIS — D509 Iron deficiency anemia, unspecified: Secondary | ICD-10-CM | POA: Diagnosis not present

## 2013-07-27 DIAGNOSIS — E1129 Type 2 diabetes mellitus with other diabetic kidney complication: Secondary | ICD-10-CM | POA: Diagnosis not present

## 2013-07-27 DIAGNOSIS — D509 Iron deficiency anemia, unspecified: Secondary | ICD-10-CM | POA: Diagnosis not present

## 2013-07-27 DIAGNOSIS — N186 End stage renal disease: Secondary | ICD-10-CM | POA: Diagnosis not present

## 2013-07-27 DIAGNOSIS — E875 Hyperkalemia: Secondary | ICD-10-CM | POA: Diagnosis not present

## 2013-07-29 DIAGNOSIS — E1129 Type 2 diabetes mellitus with other diabetic kidney complication: Secondary | ICD-10-CM | POA: Diagnosis not present

## 2013-07-29 DIAGNOSIS — E875 Hyperkalemia: Secondary | ICD-10-CM | POA: Diagnosis not present

## 2013-07-29 DIAGNOSIS — N186 End stage renal disease: Secondary | ICD-10-CM | POA: Diagnosis not present

## 2013-07-29 DIAGNOSIS — D509 Iron deficiency anemia, unspecified: Secondary | ICD-10-CM | POA: Diagnosis not present

## 2013-07-31 DIAGNOSIS — E1129 Type 2 diabetes mellitus with other diabetic kidney complication: Secondary | ICD-10-CM | POA: Diagnosis not present

## 2013-07-31 DIAGNOSIS — N186 End stage renal disease: Secondary | ICD-10-CM | POA: Diagnosis not present

## 2013-07-31 DIAGNOSIS — E875 Hyperkalemia: Secondary | ICD-10-CM | POA: Diagnosis not present

## 2013-07-31 DIAGNOSIS — D509 Iron deficiency anemia, unspecified: Secondary | ICD-10-CM | POA: Diagnosis not present

## 2013-08-03 DIAGNOSIS — E1129 Type 2 diabetes mellitus with other diabetic kidney complication: Secondary | ICD-10-CM | POA: Diagnosis not present

## 2013-08-03 DIAGNOSIS — N186 End stage renal disease: Secondary | ICD-10-CM | POA: Diagnosis not present

## 2013-08-03 DIAGNOSIS — D509 Iron deficiency anemia, unspecified: Secondary | ICD-10-CM | POA: Diagnosis not present

## 2013-08-03 DIAGNOSIS — E875 Hyperkalemia: Secondary | ICD-10-CM | POA: Diagnosis not present

## 2013-08-05 DIAGNOSIS — D509 Iron deficiency anemia, unspecified: Secondary | ICD-10-CM | POA: Diagnosis not present

## 2013-08-05 DIAGNOSIS — E875 Hyperkalemia: Secondary | ICD-10-CM | POA: Diagnosis not present

## 2013-08-05 DIAGNOSIS — E1129 Type 2 diabetes mellitus with other diabetic kidney complication: Secondary | ICD-10-CM | POA: Diagnosis not present

## 2013-08-05 DIAGNOSIS — N186 End stage renal disease: Secondary | ICD-10-CM | POA: Diagnosis not present

## 2013-08-07 DIAGNOSIS — D509 Iron deficiency anemia, unspecified: Secondary | ICD-10-CM | POA: Diagnosis not present

## 2013-08-07 DIAGNOSIS — E1129 Type 2 diabetes mellitus with other diabetic kidney complication: Secondary | ICD-10-CM | POA: Diagnosis not present

## 2013-08-07 DIAGNOSIS — N186 End stage renal disease: Secondary | ICD-10-CM | POA: Diagnosis not present

## 2013-08-07 DIAGNOSIS — E875 Hyperkalemia: Secondary | ICD-10-CM | POA: Diagnosis not present

## 2013-08-10 ENCOUNTER — Encounter (HOSPITAL_COMMUNITY): Payer: Self-pay | Admitting: Emergency Medicine

## 2013-08-10 ENCOUNTER — Emergency Department (INDEPENDENT_AMBULATORY_CARE_PROVIDER_SITE_OTHER)
Admission: EM | Admit: 2013-08-10 | Discharge: 2013-08-10 | Disposition: A | Discharge status: Home / Self Care | Payer: Medicare Other | Source: Home / Self Care

## 2013-08-10 DIAGNOSIS — H6121 Impacted cerumen, right ear: Secondary | ICD-10-CM

## 2013-08-10 DIAGNOSIS — H6120 Impacted cerumen, unspecified ear: Secondary | ICD-10-CM

## 2013-08-10 DIAGNOSIS — N186 End stage renal disease: Secondary | ICD-10-CM | POA: Diagnosis not present

## 2013-08-10 DIAGNOSIS — E875 Hyperkalemia: Secondary | ICD-10-CM | POA: Diagnosis not present

## 2013-08-10 DIAGNOSIS — E1129 Type 2 diabetes mellitus with other diabetic kidney complication: Secondary | ICD-10-CM | POA: Diagnosis not present

## 2013-08-10 DIAGNOSIS — D509 Iron deficiency anemia, unspecified: Secondary | ICD-10-CM | POA: Diagnosis not present

## 2013-08-10 MED ORDER — CARBAMIDE PEROXIDE 6.5 % OT SOLN
10.0000 [drp] | Freq: Once | OTIC | Status: AC
  Administered 2013-08-10: 10 [drp] via OTIC

## 2013-08-10 NOTE — ED Provider Notes (Signed)
CSN: [id number]     Arrival date & time 08/10/13  1218 History   First MD Initiated Contact with Patient 08/10/13 1352     Chief Complaint  Patient presents with  . Otalgia   (Consider location/radiation/quality/duration/timing/severity/associated sxs/prior Treatment) HPI Comments: C/O decrease hearing R ear x 4 d. No inner ear pain. St has "messed" with it and is tender. Placed oil in ear and then sprayed salt water in it. No good results   Past Medical History  Diagnosis Date  . Hypertension   . Anemia   . Chronic headaches   . Sleep apnea   . Chronic kidney disease     Chronic Renal Failure; On Renal Transplant List  . Presence of arteriovenous fistula for hemodialysis, primary     RUE  . Hemodialysis patient     M-W-F   Past Surgical History  Procedure Laterality Date  . Av fistula placement Right     right arm  . Graft left arm Left     for dialysis x 2  . Insertion of dialysis catheter      Rt chest   Family History  Problem Relation Age of Onset  . Diabetes Father   . Uterine cancer Mother   . Stroke Father   . Hypertension Father   . Lupus Sister   . Stroke Sister   . Anuerysm Brother     brain  . Hypertension Sister    History  Substance Use Topics  . Smoking status: Former Smoker    Types: Cigars    Quit date: 06/19/1990  . Smokeless tobacco: Not on file  . Alcohol Use: 0.0 oz/week     Comment: occasional    Review of Systems  HENT: Positive for hearing loss.   All other systems reviewed and are negative.   Allergies  Infed; Oxycodone; and Vicodin  Home Medications   Prior to Admission medications   Medication Sig Start Date End Date Taking? Authorizing Provider  amLODipine (NORVASC) 10 MG tablet Take 10 mg by mouth daily.      Historical Provider, MD  B Complex-C-Folic Acid (DIALYVITE TABLET) TABS Take by mouth.    Historical Provider, MD  calcium acetate (PHOSLO) 667 MG capsule Take 667 mg by mouth 3 (three) times daily with meals. 4  tabs TID    Historical Provider, MD  cinacalcet (SENSIPAR) 60 MG tablet Take 60 mg by mouth daily. 2 tablets once a day with meals    Historical Provider, MD  gabapentin (NEURONTIN) 300 MG capsule Take 300 mg by mouth 2 (two) times daily.      Historical Provider, MD  lanthanum (FOSRENOL) 1000 MG chewable tablet Chew 1,000 mg by mouth 3 (three) times daily with meals.      Historical Provider, MD  Multiple Vitamin (MULTI VITAMIN MENS PO) Take 1 tablet by mouth daily.      Historical Provider, MD  omeprazole (PRILOSEC) 20 MG capsule TAKE ONE CAPSULE BY MOUTH TWICE DAILY 06/23/13   Inda Castle, MD  Sorbitol SOLN by Does not apply route. 30 to 60 ml as needed for constipation     Historical Provider, MD   BP 118/76  Pulse 121  Temp(Src) 97.9 F (36.6 C) (Oral)  Resp 16  SpO2 98% Physical Exam  Nursing note and vitals reviewed. Constitutional: He is oriented to person, place, and time. He appears well-developed and well-nourished. No distress.  HENT:  Mouth/Throat: Oropharynx is clear and moist. No oropharyngeal exudate.  L TM  without signs of infection. Moderate amt of cerumen in canal R TM is obstructed by cerumen. Tenderness of EAC meatus. No drainage or moisture.  Eyes: Conjunctivae and EOM are normal.  Neck: Normal range of motion. Neck supple.  Lymphadenopathy:    He has no cervical adenopathy.  Neurological: He is alert and oriented to person, place, and time.  Skin: Skin is warm and dry.  Psychiatric: He has a normal mood and affect.    ED Course  EAR CERUMEN REMOVAL Date/Time: 08/10/2013 4:12 PM Performed by: Marcha Dutton, DAVID Authorized by: Lynne Leader, S Consent: Verbal consent obtained. Risks and benefits: risks, benefits and alternatives were discussed Consent given by: patient Patient understanding: patient states understanding of the procedure being performed Patient identity confirmed: verbally with patient Local anesthetic: none Ceruminolytics applied:  Ceruminolytics applied prior to the procedure. Location details: right ear Procedure type: irrigation Patient sedated: no Patient tolerance: Patient tolerated the procedure well with no immediate complications.   (including critical care time) Labs Review Labs Reviewed - No data to display  Imaging Review No results found.   MDM   1. Cerumen impaction, right     Irrigation, carboxamine ear drops repeat irrigation and EAC cleared TM mil redness in small portion. Otherwise nl.     Janne Napoleon, NP 08/10/13 [phone]

## 2013-08-10 NOTE — ED Notes (Signed)
C/o right ear pain States he was seen for same sx Ear is achy, losing balance, unable to hear

## 2013-08-10 NOTE — Discharge Instructions (Signed)
Cerumen Impaction °A cerumen impaction is when the wax in your ear forms a plug. This plug usually causes reduced hearing. Sometimes it also causes an earache or dizziness. Removing a cerumen impaction can be difficult and painful. The wax sticks to the ear canal. The canal is sensitive and bleeds easily. If you try to remove a heavy wax buildup with a cotton tipped swab, you may push it in further. °Irrigation with water, suction, and small ear curettes may be used to clear out the wax. If the impaction is fixed to the skin in the ear canal, ear drops may be needed for a few days to loosen the wax. People who build up a lot of wax frequently can use ear wax removal products available in your local drugstore. °SEEK MEDICAL CARE IF:  °You develop an earache, increased hearing loss, or marked dizziness. °Document Released: 03/08/2004 Document Revised: 04/23/2011 Document Reviewed: 04/28/2009 °ExitCare® Patient Information ©2015 [employer]. This information is not intended to replace advice given to you by your health care provider. Make sure you discuss any questions you have with your health care provider. ° °

## 2013-08-11 DIAGNOSIS — N186 End stage renal disease: Secondary | ICD-10-CM | POA: Diagnosis not present

## 2013-08-11 NOTE — ED Provider Notes (Signed)
Medical screening examination/treatment/procedure(s) were performed by a resident physician or non-physician practitioner and as the supervising physician I was immediately available for consultation/collaboration.  Lynne Leader, MD    Gregor Hams, MD 08/11/13 [phone]

## 2013-08-12 DIAGNOSIS — E1129 Type 2 diabetes mellitus with other diabetic kidney complication: Secondary | ICD-10-CM | POA: Diagnosis not present

## 2013-08-12 DIAGNOSIS — E875 Hyperkalemia: Secondary | ICD-10-CM | POA: Diagnosis not present

## 2013-08-12 DIAGNOSIS — D509 Iron deficiency anemia, unspecified: Secondary | ICD-10-CM | POA: Diagnosis not present

## 2013-08-12 DIAGNOSIS — N186 End stage renal disease: Secondary | ICD-10-CM | POA: Diagnosis not present

## 2013-09-02 DIAGNOSIS — E1129 Type 2 diabetes mellitus with other diabetic kidney complication: Secondary | ICD-10-CM | POA: Diagnosis not present

## 2013-09-03 ENCOUNTER — Emergency Department (INDEPENDENT_AMBULATORY_CARE_PROVIDER_SITE_OTHER): Payer: Medicare Other

## 2013-09-03 ENCOUNTER — Emergency Department (HOSPITAL_COMMUNITY): Payer: Medicare Other

## 2013-09-03 ENCOUNTER — Emergency Department (HOSPITAL_COMMUNITY)
Admission: EM | Admit: 2013-09-03 | Discharge: 2013-09-03 | Disposition: A | Discharge status: Home / Self Care | Payer: Medicare Other | Attending: Emergency Medicine | Admitting: Emergency Medicine

## 2013-09-03 ENCOUNTER — Emergency Department (INDEPENDENT_AMBULATORY_CARE_PROVIDER_SITE_OTHER)
Admission: EM | Admit: 2013-09-03 | Discharge: 2013-09-03 | Disposition: A | Discharge status: Hospice | Payer: Medicare Other | Source: Home / Self Care | Attending: Emergency Medicine | Admitting: Emergency Medicine

## 2013-09-03 ENCOUNTER — Encounter (HOSPITAL_COMMUNITY): Payer: Self-pay | Admitting: Emergency Medicine

## 2013-09-03 DIAGNOSIS — N189 Chronic kidney disease, unspecified: Secondary | ICD-10-CM | POA: Diagnosis not present

## 2013-09-03 DIAGNOSIS — R1032 Left lower quadrant pain: Secondary | ICD-10-CM | POA: Diagnosis not present

## 2013-09-03 DIAGNOSIS — K5730 Diverticulosis of large intestine without perforation or abscess without bleeding: Secondary | ICD-10-CM | POA: Diagnosis not present

## 2013-09-03 DIAGNOSIS — Z862 Personal history of diseases of the blood and blood-forming organs and certain disorders involving the immune mechanism: Secondary | ICD-10-CM | POA: Diagnosis not present

## 2013-09-03 DIAGNOSIS — Z79899 Other long term (current) drug therapy: Secondary | ICD-10-CM | POA: Insufficient documentation

## 2013-09-03 DIAGNOSIS — K5732 Diverticulitis of large intestine without perforation or abscess without bleeding: Secondary | ICD-10-CM | POA: Diagnosis not present

## 2013-09-03 DIAGNOSIS — G8929 Other chronic pain: Secondary | ICD-10-CM | POA: Insufficient documentation

## 2013-09-03 DIAGNOSIS — K5792 Diverticulitis of intestine, part unspecified, without perforation or abscess without bleeding: Secondary | ICD-10-CM

## 2013-09-03 DIAGNOSIS — Z992 Dependence on renal dialysis: Secondary | ICD-10-CM | POA: Diagnosis not present

## 2013-09-03 DIAGNOSIS — I10 Essential (primary) hypertension: Secondary | ICD-10-CM | POA: Diagnosis not present

## 2013-09-03 DIAGNOSIS — I129 Hypertensive chronic kidney disease with stage 1 through stage 4 chronic kidney disease, or unspecified chronic kidney disease: Secondary | ICD-10-CM | POA: Diagnosis not present

## 2013-09-03 DIAGNOSIS — N281 Cyst of kidney, acquired: Secondary | ICD-10-CM | POA: Diagnosis not present

## 2013-09-03 DIAGNOSIS — Z87891 Personal history of nicotine dependence: Secondary | ICD-10-CM | POA: Diagnosis not present

## 2013-09-03 LAB — POCT I-STAT, CHEM 8
BUN: 35 mg/dL — ABNORMAL HIGH (ref 6–23)
Calcium, Ion: 1.08 mmol/L — ABNORMAL LOW (ref 1.12–1.23)
Chloride: 96 mEq/L (ref 96–112)
Creatinine, Ser: 10.7 mg/dL — ABNORMAL HIGH (ref 0.50–1.35)
Glucose, Bld: 94 mg/dL (ref 70–99)
HCT: 35 % — ABNORMAL LOW (ref 39.0–52.0)
Hemoglobin: 11.9 g/dL — ABNORMAL LOW (ref 13.0–17.0)
Potassium: 4.2 mEq/L (ref 3.7–5.3)
Sodium: 136 mEq/L — ABNORMAL LOW (ref 137–147)
TCO2: 27 mmol/L (ref 0–100)

## 2013-09-03 LAB — CBC
HCT: 32.2 % — ABNORMAL LOW (ref 39.0–52.0)
Hemoglobin: 10.5 g/dL — ABNORMAL LOW (ref 13.0–17.0)
MCH: 29.6 pg (ref 26.0–34.0)
MCHC: 32.6 g/dL (ref 30.0–36.0)
MCV: 90.7 fL (ref 78.0–100.0)
Platelets: 305 10*3/uL (ref 150–400)
RBC: 3.55 MIL/uL — ABNORMAL LOW (ref 4.22–5.81)
RDW: 15.3 % (ref 11.5–15.5)
WBC: 11.5 10*3/uL — ABNORMAL HIGH (ref 4.0–10.5)

## 2013-09-03 MED ORDER — METRONIDAZOLE 500 MG PO TABS: 500.0000 mg | ORAL_TABLET | Freq: Three times a day (TID) | ORAL | Status: DC

## 2013-09-03 MED ORDER — OXYCODONE-ACETAMINOPHEN 5-325 MG PO TABS: 2.0000 | ORAL_TABLET | ORAL | Status: DC | PRN

## 2013-09-03 MED ORDER — ONDANSETRON 4 MG PO TBDP
ORAL_TABLET | ORAL | Status: AC
  Filled 2013-09-03: qty 1

## 2013-09-03 MED ORDER — CIPROFLOXACIN HCL 500 MG PO TABS: 500.0000 mg | ORAL_TABLET | Freq: Two times a day (BID) | ORAL | Status: DC

## 2013-09-03 MED ORDER — PROMETHAZINE HCL 25 MG PO TABS: 25.0000 mg | ORAL_TABLET | Freq: Four times a day (QID) | ORAL | Status: DC | PRN

## 2013-09-03 MED ORDER — ONDANSETRON 4 MG PO TBDP
4.0000 mg | ORAL_TABLET | Freq: Once | ORAL | Status: AC
  Administered 2013-09-03: 4 mg via ORAL

## 2013-09-03 NOTE — Discharge Instructions (Signed)
Cipro and Flagyl as prescribed.  Percocet as prescribed as needed for pain. Phenergan as prescribed as needed for nausea.  Return to the emergency department if you develop severe abdominal pain, bloody stool, high fever, or vomiting with inability to keep your medications down.   Diverticulitis Diverticulitis is inflammation or infection of small pouches in your colon that form when you have a condition called diverticulosis. The pouches in your colon are called diverticula. Your colon, or large intestine, is where water is absorbed and stool is formed. Complications of diverticulitis can include:  Bleeding.  Severe infection.  Severe pain.  Perforation of your colon.  Obstruction of your colon. CAUSES  Diverticulitis is caused by bacteria. Diverticulitis happens when stool becomes trapped in diverticula. This allows bacteria to grow in the diverticula, which can lead to inflammation and infection. RISK FACTORS People with diverticulosis are at risk for diverticulitis. Eating a diet that does not include enough fiber from fruits and vegetables may make diverticulitis more likely to develop. SYMPTOMS  Symptoms of diverticulitis may include:  Abdominal pain and tenderness. The pain is normally located on the left side of the abdomen, but may occur in other areas.  Fever and chills.  Bloating.  Cramping.  Nausea.  Vomiting.  Constipation.  Diarrhea.  Blood in your stool. DIAGNOSIS  Your health care provider will ask you about your medical history and do a physical exam. You may need to have tests done because many medical conditions can cause the same symptoms as diverticulitis. Tests may include:  Blood tests.  Urine tests.  Imaging tests of the abdomen, including X-rays and CT scans. When your condition is under control, your health care provider may recommend that you have a colonoscopy. A colonoscopy can show how severe your diverticula are and whether something  else is causing your symptoms. TREATMENT  Most cases of diverticulitis are mild and can be treated at home. Treatment may include:  Taking over-the-counter pain medicines.  Following a clear liquid diet.  Taking antibiotic medicines by mouth for 7-10 days. More severe cases may be treated at a hospital. Treatment may include:  Not eating or drinking.  Taking prescription pain medicine.  Receiving antibiotic medicines through an IV tube.  Receiving fluids and nutrition through an IV tube.  Surgery. HOME CARE INSTRUCTIONS   Follow your health care provider's instructions carefully.  Follow a full liquid diet or other diet as directed by your health care provider. After your symptoms improve, your health care provider may tell you to change your diet. He or she may recommend you eat a high-fiber diet. Fruits and vegetables are good sources of fiber. Fiber makes it easier to pass stool.  Take fiber supplements or probiotics as directed by your health care provider.  Only take medicines as directed by your health care provider.  Keep all your follow-up appointments. SEEK MEDICAL CARE IF:   Your pain does not improve.  You have a hard time eating food.  Your bowel movements do not return to normal. SEEK IMMEDIATE MEDICAL CARE IF:   Your pain becomes worse.  Your symptoms do not get better.  Your symptoms suddenly get worse.  You have a fever.  You have repeated vomiting.  You have bloody or black, tarry stools. MAKE SURE YOU:   Understand these instructions.  Will watch your condition.  Will get help right away if you are not doing well or get worse. Document Released: 11/08/2004 Document Revised: 02/03/2013 Document Reviewed: 12/24/2012 ExitCare Patient  Information 2015 [place], [place]. This information is not intended to replace advice given to you by your health care provider. Make sure you discuss any questions you have with your health care provider.

## 2013-09-03 NOTE — ED Notes (Signed)
Left sided abd  Pain x 1 week n/v started this am went to our UC and was sent here for further tests had labs done  There this am and his WBC is elevated pt has shunt rt  Arm has dialysis  M W F

## 2013-09-03 NOTE — ED Provider Notes (Signed)
CSN: [id number]     Arrival date & time 09/03/13  0825 History   First MD Initiated Contact with Patient 09/03/13 [phone]     Chief Complaint  Patient presents with  . Abdominal Pain   (Consider location/radiation/quality/duration/timing/severity/associated sxs/prior Treatment) HPI Comments: Patient states that despite having ESRD and receiving HD, he still makes small amounts of urine and he has noticed that his LLQ pain intensifies with urination and does report dysuria. Has tried using suppository and has had normal BMs with no relief.  Has developed intensification of pain at LLQ with associated, anorexia,  N & V over past 24 hours. No previous surgeries.   Patient is a 53 y.o. male presenting with abdominal pain. The history is provided by the patient.  Abdominal Pain Pain location:  LLQ Pain quality: sharp   Pain radiates to:  Does not radiate Pain severity:  Moderate Onset quality:  Gradual Duration:  1 week Timing:  Constant (with periods of intensification) Progression:  Worsening Chronicity:  New Worsened by:  Urination and bowel movements Ineffective treatments:  Bowel activity Associated symptoms: anorexia, dysuria, nausea and vomiting   Associated symptoms: no chest pain, no chills, no constipation, no cough, no diarrhea, no fatigue, no fever, no flatus, no hematemesis, no hematochezia, no hematuria, no melena, no shortness of breath and no sore throat   Risk factors: no alcohol abuse, has not had multiple surgeries and no NSAID use     Past Medical History  Diagnosis Date  . Hypertension   . Anemia   . Chronic headaches   . Sleep apnea   . Chronic kidney disease     Chronic Renal Failure; On Renal Transplant List  . Presence of arteriovenous fistula for hemodialysis, primary     RUE  . Hemodialysis patient     M-W-F   Past Surgical History  Procedure Laterality Date  . Av fistula placement Right     right arm  . Graft left arm Left     for dialysis x 2  .  Insertion of dialysis catheter      Rt chest   Family History  Problem Relation Age of Onset  . Diabetes Father   . Uterine cancer Mother   . Stroke Father   . Hypertension Father   . Lupus Sister   . Stroke Sister   . Anuerysm Brother     brain  . Hypertension Sister    History  Substance Use Topics  . Smoking status: Former Smoker    Types: Cigars    Quit date: 06/19/1990  . Smokeless tobacco: Not on file  . Alcohol Use: 0.0 oz/week     Comment: occasional    Review of Systems  Constitutional: Negative for fever, chills and fatigue.  HENT: Negative for sore throat.   Respiratory: Negative for cough and shortness of breath.   Cardiovascular: Negative for chest pain.  Gastrointestinal: Positive for nausea, vomiting, abdominal pain and anorexia. Negative for diarrhea, constipation, melena, hematochezia, flatus and hematemesis.  Genitourinary: Positive for dysuria. Negative for hematuria.  All other systems reviewed and are negative.   Allergies  Infed; Oxycodone; and Vicodin  Home Medications   Prior to Admission medications   Medication Sig Start Date End Date Taking? Authorizing Provider  amLODipine (NORVASC) 10 MG tablet Take 10 mg by mouth daily.      Historical Provider, MD  B Complex-C-Folic Acid (DIALYVITE TABLET) TABS Take by mouth.    Historical Provider, MD  calcium acetate (PHOSLO)  667 MG capsule Take 667 mg by mouth 3 (three) times daily with meals. 4 tabs TID    Historical Provider, MD  cinacalcet (SENSIPAR) 60 MG tablet Take 60 mg by mouth daily. 2 tablets once a day with meals    Historical Provider, MD  gabapentin (NEURONTIN) 300 MG capsule Take 300 mg by mouth 2 (two) times daily.      Historical Provider, MD  lanthanum (FOSRENOL) 1000 MG chewable tablet Chew 1,000 mg by mouth 3 (three) times daily with meals.      Historical Provider, MD  Multiple Vitamin (MULTI VITAMIN MENS PO) Take 1 tablet by mouth daily.      Historical Provider, MD  omeprazole  (PRILOSEC) 20 MG capsule TAKE ONE CAPSULE BY MOUTH TWICE DAILY 06/23/13   Inda Castle, MD  Sorbitol SOLN by Does not apply route. 30 to 60 ml as needed for constipation     Historical Provider, MD   BP 101/66  Pulse 101  Temp(Src) 98.5 F (36.9 C) (Oral)  Resp 18  SpO2 98% Physical Exam  Nursing note and vitals reviewed. Constitutional: He is oriented to person, place, and time. He appears well-developed and well-nourished. No distress.  HENT:  Head: Normocephalic and atraumatic.  Mouth/Throat: Mucous membranes are dry.  Eyes: Conjunctivae are normal. No scleral icterus.  Cardiovascular: Normal rate, regular rhythm and normal heart sounds.   Pulmonary/Chest: Effort normal and breath sounds normal. No respiratory distress. He has no wheezes.  Abdominal: Soft. Normal appearance and bowel sounds are normal. He exhibits no mass. There is tenderness in the left lower quadrant. There is no rigidity, no rebound, no guarding and no CVA tenderness. No hernia. Hernia confirmed negative in the ventral area, confirmed negative in the right inguinal area and confirmed negative in the left inguinal area.  Musculoskeletal: Normal range of motion.  Neurological: He is alert and oriented to person, place, and time.  Skin: Skin is warm and dry.  Psychiatric: He has a normal mood and affect. His behavior is normal.    ED Course  Procedures (including critical care time) Labs Review Labs Reviewed  CBC - Abnormal; Notable for the following:    WBC 11.5 (*)    RBC 3.55 (*)    Hemoglobin 10.5 (*)    HCT 32.2 (*)    All other components within normal limits  POCT I-STAT, CHEM 8 - Abnormal; Notable for the following:    Sodium 136 (*)    BUN 35 (*)    Creatinine, Ser 10.70 (*)    Calcium, Ion 1.08 (*)    Hemoglobin 11.9 (*)    HCT 35.0 (*)    All other components within normal limits    Imaging Review Dg Abd 2 Views  09/03/2013   CLINICAL DATA:  Left lower quadrant pain  EXAM: ABDOMEN - 2  VIEW  COMPARISON:  None.  FINDINGS: Normal bowel gas pattern. No bowel obstruction or ileus. No free air.  Lumbar levoscoliosis. Moderate degenerative change in both hip joints. No renal calculi.  IMPRESSION: No acute abnormality.   Electronically Signed   By: Franchot Gallo M.D.   On: 09/03/2013 09:39     MDM   1. Left lower quadrant pain   Patient unable to produce UA while at [facility]. Istat-8 consistent with known ESRD CBC with mild leukocytosis. Flat/upright abdominal films unremarkable. I am concerned about possible diverticulitis. Will transfer to [facility] for possible CT A/P for this patient. Provided 4mg  ODT Zofran while at  [place], [place] 09/03/13 1101

## 2013-09-03 NOTE — ED Provider Notes (Signed)
CSN: [id number]     Arrival date & time 09/03/13  1013 History   First MD Initiated Contact with Patient 09/03/13 1027     Chief Complaint  Patient presents with  . Abdominal Pain     (Consider location/radiation/quality/duration/timing/severity/associated sxs/prior Treatment) HPI Comments: Patient is a 53 year old male with history of end-stage renal disease on hemodialysis. He presents today with complaints of lower left-sided abdominal pain and flank pain that has been worsening over the past week. He denies any injury or trauma. He denies any bowel or bladder complaints. Feels as though he needs to urinate however is unable to do so, and this causes him some discomfort.  Patient is a 54 y.o. male presenting with abdominal pain. The history is provided by the patient.  Abdominal Pain Pain location:  LLQ and L flank Pain quality: stabbing   Pain radiates to:  L flank Pain severity:  Moderate Onset quality:  Gradual Duration:  1 week Timing:  Constant Progression:  Worsening Chronicity:  New Relieved by:  Nothing Worsened by:  Nothing tried Ineffective treatments:  None tried   Past Medical History  Diagnosis Date  . Hypertension   . Anemia   . Chronic headaches   . Sleep apnea   . Chronic kidney disease     Chronic Renal Failure; On Renal Transplant List  . Presence of arteriovenous fistula for hemodialysis, primary     RUE  . Hemodialysis patient     M-W-F   Past Surgical History  Procedure Laterality Date  . Av fistula placement Right     right arm  . Graft left arm Left     for dialysis x 2  . Insertion of dialysis catheter      Rt chest   Family History  Problem Relation Age of Onset  . Diabetes Father   . Uterine cancer Mother   . Stroke Father   . Hypertension Father   . Lupus Sister   . Stroke Sister   . Anuerysm Brother     brain  . Hypertension Sister    History  Substance Use Topics  . Smoking status: Former Smoker    Types: Cigars    Quit  date: 06/19/1990  . Smokeless tobacco: Not on file  . Alcohol Use: 0.0 oz/week     Comment: occasional    Review of Systems  Gastrointestinal: Positive for abdominal pain.  All other systems reviewed and are negative.     Allergies  Infed; Oxycodone; and Vicodin  Home Medications   Prior to Admission medications   Medication Sig Start Date End Date Taking? Authorizing Provider  amLODipine (NORVASC) 10 MG tablet Take 10 mg by mouth daily.      Historical Provider, MD  B Complex-C-Folic Acid (DIALYVITE TABLET) TABS Take by mouth.    Historical Provider, MD  calcium acetate (PHOSLO) 667 MG capsule Take 667 mg by mouth 3 (three) times daily with meals. 4 tabs TID    Historical Provider, MD  cinacalcet (SENSIPAR) 60 MG tablet Take 60 mg by mouth daily. 2 tablets once a day with meals    Historical Provider, MD  gabapentin (NEURONTIN) 300 MG capsule Take 300 mg by mouth 2 (two) times daily.      Historical Provider, MD  lanthanum (FOSRENOL) 1000 MG chewable tablet Chew 1,000 mg by mouth 3 (three) times daily with meals.      Historical Provider, MD  Multiple Vitamin (MULTI VITAMIN MENS PO) Take 1 tablet by mouth daily.  Historical Provider, MD  omeprazole (PRILOSEC) 20 MG capsule TAKE ONE CAPSULE BY MOUTH TWICE DAILY 06/23/13   Inda Castle, MD  Sorbitol SOLN by Does not apply route. 30 to 60 ml as needed for constipation     Historical Provider, MD   BP 112/72  Pulse 87  Temp(Src) 98.6 F (37 C) (Oral)  Resp 16  SpO2 98% Physical Exam  Nursing note and vitals reviewed. Constitutional: He is oriented to person, place, and time. He appears well-developed and well-nourished. No distress.  HENT:  Head: Normocephalic and atraumatic.  Neck: Normal range of motion. Neck supple.  Cardiovascular: Normal rate, regular rhythm and normal heart sounds.   No murmur heard. Pulmonary/Chest: Effort normal and breath sounds normal. No respiratory distress. He has no wheezes.  Abdominal:  Soft. Bowel sounds are normal. He exhibits no distension. There is tenderness. There is no rebound and no guarding.  There is tenderness to palpation in the left lower quadrant with no rebound and no guarding.  I am unable to appreciate a hernia in the inguinal region.  Musculoskeletal: Normal range of motion.  Neurological: He is alert and oriented to person, place, and time.  Skin: Skin is warm and dry. He is not diaphoretic.    ED Course  Procedures (including critical care time) Labs Review Labs Reviewed  URINALYSIS, ROUTINE W REFLEX MICROSCOPIC    Imaging Review Dg Abd 2 Views  09/03/2013   CLINICAL DATA:  Left lower quadrant pain  EXAM: ABDOMEN - 2 VIEW  COMPARISON:  None.  FINDINGS: Normal bowel gas pattern. No bowel obstruction or ileus. No free air.  Lumbar levoscoliosis. Moderate degenerative change in both hip joints. No renal calculi.  IMPRESSION: No acute abnormality.   Electronically Signed   By: Franchot Gallo M.D.   On: 09/03/2013 09:39     EKG Interpretation None      MDM   Final diagnoses:  None    Patient presents here with complaints of left lower quadrant pain. He is a dialysis patient and is unable to make urine. Workup reveals unremarkable labs that were performed at urgent care, however CT in the ER does show uncomplicated diverticulitis. He will be treated with Cipro, Flagyl, and pain medication. He understands to return if his symptoms substantially worsen or change.    Veryl Speak, MD 09/03/13 [phone]

## 2013-09-03 NOTE — ED Notes (Signed)
Pt states he has still not been able to void due to pain and dialysis;nurse aware.

## 2013-09-03 NOTE — ED Notes (Signed)
Patient can't void at this time 

## 2013-09-03 NOTE — ED Provider Notes (Signed)
Medical screening examination/treatment/procedure(s) were performed by resident physician or non-physician practitioner and as supervising physician I was immediately available for consultation/collaboration.  Maryruth Eve, MD     Melony Overly, MD 09/03/13 1300

## 2013-09-03 NOTE — ED Notes (Signed)
C/o lower abd pain radiating to groin area which started a week ago States painful to urinate No tx tried

## 2013-09-11 DIAGNOSIS — N186 End stage renal disease: Secondary | ICD-10-CM | POA: Diagnosis not present

## 2013-09-14 DIAGNOSIS — N186 End stage renal disease: Secondary | ICD-10-CM | POA: Diagnosis not present

## 2013-09-14 DIAGNOSIS — E1129 Type 2 diabetes mellitus with other diabetic kidney complication: Secondary | ICD-10-CM | POA: Diagnosis not present

## 2013-09-14 DIAGNOSIS — N039 Chronic nephritic syndrome with unspecified morphologic changes: Secondary | ICD-10-CM | POA: Diagnosis not present

## 2013-09-14 DIAGNOSIS — E875 Hyperkalemia: Secondary | ICD-10-CM | POA: Diagnosis not present

## 2013-09-14 DIAGNOSIS — D631 Anemia in chronic kidney disease: Secondary | ICD-10-CM | POA: Diagnosis not present

## 2013-09-14 DIAGNOSIS — D509 Iron deficiency anemia, unspecified: Secondary | ICD-10-CM | POA: Diagnosis not present

## 2013-10-12 DIAGNOSIS — N186 End stage renal disease: Secondary | ICD-10-CM | POA: Diagnosis not present

## 2013-10-14 DIAGNOSIS — D509 Iron deficiency anemia, unspecified: Secondary | ICD-10-CM | POA: Diagnosis not present

## 2013-10-14 DIAGNOSIS — N039 Chronic nephritic syndrome with unspecified morphologic changes: Secondary | ICD-10-CM | POA: Diagnosis not present

## 2013-10-14 DIAGNOSIS — E1129 Type 2 diabetes mellitus with other diabetic kidney complication: Secondary | ICD-10-CM | POA: Diagnosis not present

## 2013-10-14 DIAGNOSIS — D631 Anemia in chronic kidney disease: Secondary | ICD-10-CM | POA: Diagnosis not present

## 2013-10-14 DIAGNOSIS — E875 Hyperkalemia: Secondary | ICD-10-CM | POA: Diagnosis not present

## 2013-10-14 DIAGNOSIS — N186 End stage renal disease: Secondary | ICD-10-CM | POA: Diagnosis not present

## 2013-11-11 DIAGNOSIS — N186 End stage renal disease: Secondary | ICD-10-CM | POA: Diagnosis not present

## 2013-11-13 DIAGNOSIS — N186 End stage renal disease: Secondary | ICD-10-CM | POA: Diagnosis not present

## 2013-11-13 DIAGNOSIS — E875 Hyperkalemia: Secondary | ICD-10-CM | POA: Diagnosis not present

## 2013-11-13 DIAGNOSIS — D509 Iron deficiency anemia, unspecified: Secondary | ICD-10-CM | POA: Diagnosis not present

## 2013-11-13 DIAGNOSIS — Z23 Encounter for immunization: Secondary | ICD-10-CM | POA: Diagnosis not present

## 2013-11-13 DIAGNOSIS — D631 Anemia in chronic kidney disease: Secondary | ICD-10-CM | POA: Diagnosis not present

## 2013-11-13 DIAGNOSIS — E1129 Type 2 diabetes mellitus with other diabetic kidney complication: Secondary | ICD-10-CM | POA: Diagnosis not present

## 2013-12-09 DIAGNOSIS — E1129 Type 2 diabetes mellitus with other diabetic kidney complication: Secondary | ICD-10-CM | POA: Diagnosis not present

## 2013-12-12 DIAGNOSIS — N186 End stage renal disease: Secondary | ICD-10-CM | POA: Diagnosis not present

## 2013-12-12 DIAGNOSIS — Z992 Dependence on renal dialysis: Secondary | ICD-10-CM | POA: Diagnosis not present

## 2013-12-14 DIAGNOSIS — D631 Anemia in chronic kidney disease: Secondary | ICD-10-CM | POA: Diagnosis not present

## 2013-12-14 DIAGNOSIS — N2581 Secondary hyperparathyroidism of renal origin: Secondary | ICD-10-CM | POA: Diagnosis not present

## 2013-12-14 DIAGNOSIS — E1129 Type 2 diabetes mellitus with other diabetic kidney complication: Secondary | ICD-10-CM | POA: Diagnosis not present

## 2013-12-14 DIAGNOSIS — E875 Hyperkalemia: Secondary | ICD-10-CM | POA: Diagnosis not present

## 2013-12-14 DIAGNOSIS — N186 End stage renal disease: Secondary | ICD-10-CM | POA: Diagnosis not present

## 2013-12-14 DIAGNOSIS — D509 Iron deficiency anemia, unspecified: Secondary | ICD-10-CM | POA: Diagnosis not present

## 2013-12-22 ENCOUNTER — Emergency Department (INDEPENDENT_AMBULATORY_CARE_PROVIDER_SITE_OTHER)
Admission: EM | Admit: 2013-12-22 | Discharge: 2013-12-22 | Disposition: A | Discharge status: Home / Self Care | Payer: Medicare Other | Source: Home / Self Care | Attending: Family Medicine | Admitting: Family Medicine

## 2013-12-22 ENCOUNTER — Encounter (HOSPITAL_COMMUNITY): Payer: Self-pay | Admitting: *Deleted

## 2013-12-22 ENCOUNTER — Encounter (HOSPITAL_COMMUNITY): Payer: Self-pay

## 2013-12-22 ENCOUNTER — Emergency Department (HOSPITAL_COMMUNITY)
Admission: EM | Admit: 2013-12-22 | Discharge: 2013-12-22 | Disposition: A | Discharge status: Home / Self Care | Payer: Medicare Other | Attending: Emergency Medicine | Admitting: Emergency Medicine

## 2013-12-22 DIAGNOSIS — K5732 Diverticulitis of large intestine without perforation or abscess without bleeding: Secondary | ICD-10-CM

## 2013-12-22 DIAGNOSIS — Z87891 Personal history of nicotine dependence: Secondary | ICD-10-CM | POA: Insufficient documentation

## 2013-12-22 DIAGNOSIS — Z940 Kidney transplant status: Secondary | ICD-10-CM | POA: Insufficient documentation

## 2013-12-22 DIAGNOSIS — I12 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 stage renal disease: Secondary | ICD-10-CM | POA: Insufficient documentation

## 2013-12-22 DIAGNOSIS — R1032 Left lower quadrant pain: Secondary | ICD-10-CM | POA: Diagnosis not present

## 2013-12-22 DIAGNOSIS — G8929 Other chronic pain: Secondary | ICD-10-CM | POA: Insufficient documentation

## 2013-12-22 DIAGNOSIS — Z79899 Other long term (current) drug therapy: Secondary | ICD-10-CM | POA: Diagnosis not present

## 2013-12-22 DIAGNOSIS — Z992 Dependence on renal dialysis: Secondary | ICD-10-CM | POA: Insufficient documentation

## 2013-12-22 DIAGNOSIS — Z7982 Long term (current) use of aspirin: Secondary | ICD-10-CM | POA: Insufficient documentation

## 2013-12-22 DIAGNOSIS — N186 End stage renal disease: Secondary | ICD-10-CM | POA: Insufficient documentation

## 2013-12-22 DIAGNOSIS — I10 Essential (primary) hypertension: Secondary | ICD-10-CM | POA: Diagnosis not present

## 2013-12-22 DIAGNOSIS — Z862 Personal history of diseases of the blood and blood-forming organs and certain disorders involving the immune mechanism: Secondary | ICD-10-CM | POA: Insufficient documentation

## 2013-12-22 DIAGNOSIS — R109 Unspecified abdominal pain: Secondary | ICD-10-CM | POA: Diagnosis present

## 2013-12-22 LAB — COMPREHENSIVE METABOLIC PANEL
ALT: 11 U/L (ref 0–53)
AST: 23 U/L (ref 0–37)
Albumin: 3.1 g/dL — ABNORMAL LOW (ref 3.5–5.2)
Alkaline Phosphatase: 70 U/L (ref 39–117)
Anion gap: 20 — ABNORMAL HIGH (ref 5–15)
BUN: 38 mg/dL — ABNORMAL HIGH (ref 6–23)
CO2: 26 mEq/L (ref 19–32)
Calcium: 10.4 mg/dL (ref 8.4–10.5)
Chloride: 91 mEq/L — ABNORMAL LOW (ref 96–112)
Creatinine, Ser: 9.09 mg/dL — ABNORMAL HIGH (ref 0.50–1.35)
GFR calc Af Amer: 7 mL/min — ABNORMAL LOW (ref >90)
GFR calc non Af Amer: 6 mL/min — ABNORMAL LOW (ref >90)
Glucose, Bld: 89 mg/dL (ref 70–99)
Potassium: 5.3 mEq/L (ref 3.7–5.3)
Sodium: 137 mEq/L (ref 137–147)
Total Bilirubin: 0.4 mg/dL (ref 0.3–1.2)
Total Protein: 8.8 g/dL — ABNORMAL HIGH (ref 6.0–8.3)

## 2013-12-22 LAB — CBC WITH DIFFERENTIAL/PLATELET
Basophils Absolute: 0 10*3/uL (ref 0.0–0.1)
Basophils Relative: 0 % (ref 0–1)
Eosinophils Absolute: 0.1 10*3/uL (ref 0.0–0.7)
Eosinophils Relative: 1 % (ref 0–5)
HCT: 37.5 % — ABNORMAL LOW (ref 39.0–52.0)
Hemoglobin: 12.4 g/dL — ABNORMAL LOW (ref 13.0–17.0)
Lymphocytes Relative: 10 % — ABNORMAL LOW (ref 12–46)
Lymphs Abs: 1 10*3/uL (ref 0.7–4.0)
MCH: 29.8 pg (ref 26.0–34.0)
MCHC: 33.1 g/dL (ref 30.0–36.0)
MCV: 90.1 fL (ref 78.0–100.0)
Monocytes Absolute: 0.8 10*3/uL (ref 0.1–1.0)
Monocytes Relative: 8 % (ref 3–12)
Neutro Abs: 8 10*3/uL — ABNORMAL HIGH (ref 1.7–7.7)
Neutrophils Relative %: 81 % — ABNORMAL HIGH (ref 43–77)
Platelets: 306 10*3/uL (ref 150–400)
RBC: 4.16 MIL/uL — ABNORMAL LOW (ref 4.22–5.81)
RDW: 16.3 % — ABNORMAL HIGH (ref 11.5–15.5)
WBC: 9.9 10*3/uL (ref 4.0–10.5)

## 2013-12-22 LAB — LIPASE, BLOOD: Lipase: 265 U/L — ABNORMAL HIGH (ref 11–59)

## 2013-12-22 MED ORDER — ONDANSETRON HCL 4 MG/2ML IJ SOLN
4.0000 mg | Freq: Once | INTRAMUSCULAR | Status: AC
  Administered 2013-12-22: 4 mg via INTRAVENOUS
  Filled 2013-12-22: qty 2

## 2013-12-22 MED ORDER — CIPROFLOXACIN IN D5W 400 MG/200ML IV SOLN
400.0000 mg | Freq: Once | INTRAVENOUS | Status: AC
  Administered 2013-12-22: 400 mg via INTRAVENOUS
  Filled 2013-12-22: qty 200

## 2013-12-22 MED ORDER — CIPROFLOXACIN HCL 500 MG PO TABS: 500.0000 mg | ORAL_TABLET | Freq: Every day | ORAL | Status: DC

## 2013-12-22 MED ORDER — METRONIDAZOLE 500 MG PO TABS: 500.0000 mg | ORAL_TABLET | Freq: Two times a day (BID) | ORAL | Status: DC

## 2013-12-22 MED ORDER — TRAMADOL HCL 50 MG PO TABS: 50.0000 mg | ORAL_TABLET | Freq: Four times a day (QID) | ORAL | Status: DC | PRN

## 2013-12-22 MED ORDER — SODIUM CHLORIDE 0.9 % IV SOLN
INTRAVENOUS | Status: DC
  Administered 2013-12-22: 11:00:00 via INTRAVENOUS

## 2013-12-22 MED ORDER — METRONIDAZOLE IN NACL 5-0.79 MG/ML-% IV SOLN
500.0000 mg | Freq: Once | INTRAVENOUS | Status: AC
  Administered 2013-12-22: 500 mg via INTRAVENOUS
  Filled 2013-12-22: qty 100

## 2013-12-22 MED ORDER — MORPHINE SULFATE 4 MG/ML IJ SOLN
4.0000 mg | INTRAMUSCULAR | Status: DC | PRN
  Administered 2013-12-22: 4 mg via INTRAVENOUS
  Filled 2013-12-22: qty 1

## 2013-12-22 MED ORDER — PROMETHAZINE HCL 25 MG PO TABS: 25.0000 mg | ORAL_TABLET | Freq: Four times a day (QID) | ORAL | Status: DC | PRN

## 2013-12-22 NOTE — ED Notes (Signed)
Placed restricted extremity band on patients right arm due to dialysis shunt in right arm

## 2013-12-22 NOTE — ED Provider Notes (Signed)
CSN: [id number]     Arrival date & time 12/22/13  0944 History   First MD Initiated Contact with Patient 12/22/13 1000     Chief Complaint  Patient presents with  . Abdominal Pain     (Consider location/radiation/quality/duration/timing/severity/associated sxs/prior Treatment) HPI   Carl Ackers Sr. is a 53 y.o. male sent from urgent care for evaluation of left-sided abdominal pain with nausea vomiting onset yesterday states that it feels like his prior episode of diverticulitis which responded well to oral antibiotics and pain medications. Patient denies fever, chills, diarrhea (states he had a normal bowel movement this morning without blood or melena) patient rates his pain at 6 out of 10, no pain medications taken prior to arrival, no exacerbating or alleviating factors identified. Patient is on hemodialysis, fully dialyzed yesterday.  Past Medical History  Diagnosis Date  . Hypertension   . Anemia   . Chronic headaches   . Sleep apnea   . Chronic kidney disease     Chronic Renal Failure; On Renal Transplant List  . Presence of arteriovenous fistula for hemodialysis, primary     RUE  . Hemodialysis patient     M-W-F   Past Surgical History  Procedure Laterality Date  . Av fistula placement Right     right arm  . Graft left arm Left     for dialysis x 2  . Insertion of dialysis catheter      Rt chest   Family History  Problem Relation Age of Onset  . Diabetes Father   . Uterine cancer Mother   . Stroke Father   . Hypertension Father   . Lupus Sister   . Stroke Sister   . Anuerysm Brother     brain  . Hypertension Sister    History  Substance Use Topics  . Smoking status: Former Smoker    Types: Cigars    Quit date: 06/19/1990  . Smokeless tobacco: Not on file  . Alcohol Use: 0.0 oz/week     Comment: occasional    Review of Systems  10 systems reviewed and found to be negative, except as noted in the HPI.   Allergies  Infed; Oxycodone; and  Vicodin  Home Medications   Prior to Admission medications   Medication Sig Start Date End Date Taking? Authorizing Provider  BAYER ASPIRIN PO Take 2 tablets by mouth daily as needed (for headache).   Yes Historical Provider, MD  calcium acetate (PHOSLO) 667 MG capsule Take 3,335 mg by mouth 3 (three) times daily with meals.    Yes Historical Provider, MD  cinacalcet (SENSIPAR) 60 MG tablet Take 120 mg by mouth daily.    Yes Historical Provider, MD  gabapentin (NEURONTIN) 300 MG capsule Take 300 mg by mouth daily.    Yes Historical Provider, MD  ibuprofen (ADVIL,MOTRIN) 600 MG tablet Take 600 mg by mouth every 8 (eight) hours as needed for moderate pain. For headache or pain 06/24/13  Yes Historical Provider, MD  multivitamin (RENA-VIT) TABS tablet Take 1 tablet by mouth daily.   Yes Historical Provider, MD  Sorbitol SOLN Take 60 mLs by mouth daily as needed (for constipation).    Yes Historical Provider, MD  ciprofloxacin (CIPRO) 500 MG tablet Take 1 tablet (500 mg total) by mouth daily. 12/22/13   Javoris Star, PA-C  metroNIDAZOLE (FLAGYL) 500 MG tablet Take 1 tablet (500 mg total) by mouth 2 (two) times daily. One tab PO bid x 10 days 12/22/13   Monico Blitz, PA-C  Multiple Vitamin (MULTI VITAMIN MENS PO) Take 1 tablet by mouth daily.      Historical Provider, MD  omeprazole (PRILOSEC) 20 MG capsule Take 20 mg by mouth daily as needed (for acid reflux).    Historical Provider, MD  oxyCODONE-acetaminophen (PERCOCET) 5-325 MG per tablet Take 2 tablets by mouth every 4 (four) hours as needed. 09/03/13   Veryl Speak, MD  promethazine (PHENERGAN) 25 MG tablet Take 1 tablet (25 mg total) by mouth every 6 (six) hours as needed for nausea or vomiting. 12/22/13   Faiza Bansal, PA-C  traMADol (ULTRAM) 50 MG tablet Take 1 tablet (50 mg total) by mouth every 6 (six) hours as needed. 12/22/13   Ninoshka Wainwright, PA-C   BP 131/93 mmHg  Pulse 87  Temp(Src) 98.5 F (36.9 C) (Oral)  Resp 23   SpO2 100% Physical Exam  Constitutional: He is oriented to person, place, and time. He appears well-developed and well-nourished. No distress.  HENT:  Head: Normocephalic.  Eyes: Conjunctivae and EOM are normal.  Cardiovascular: Normal rate.   Mild tachycardia  Fistula to right AC with good thrill.  Pulmonary/Chest: Effort normal and breath sounds normal. No stridor.  Abdominal: Soft. Bowel sounds are normal. He exhibits no distension and no mass. There is no tenderness. There is no rebound and no guarding.  Normoactive bowel sounds, no tenderness to deep palpation of any quadrant.  Musculoskeletal: Normal range of motion.  Neurological: He is alert and oriented to person, place, and time.  Psychiatric: He has a normal mood and affect.  Nursing note and vitals reviewed.   ED Course  Procedures (including critical care time) Labs Review Labs Reviewed  CBC WITH DIFFERENTIAL - Abnormal; Notable for the following:    RBC 4.16 (*)    Hemoglobin 12.4 (*)    HCT 37.5 (*)    RDW 16.3 (*)    Neutrophils Relative % 81 (*)    Neutro Abs 8.0 (*)    Lymphocytes Relative 10 (*)    All other components within normal limits  COMPREHENSIVE METABOLIC PANEL - Abnormal; Notable for the following:    Chloride 91 (*)    BUN 38 (*)    Creatinine, Ser 9.09 (*)    Total Protein 8.8 (*)    Albumin 3.1 (*)    GFR calc non Af Amer 6 (*)    GFR calc Af Amer 7 (*)    Anion gap 20 (*)    All other components within normal limits  LIPASE, BLOOD - Abnormal; Notable for the following:    Lipase 265 (*)    All other components within normal limits    Imaging Review No results found.   EKG Interpretation None      MDM   Final diagnoses:  LLQ abdominal pain   Filed Vitals:   12/22/13 1100 12/22/13 1130 12/22/13 1200 12/22/13 1230  BP:  112/91 121/86 131/93  Pulse: 102 93 90 87  Temp:      TempSrc:      Resp: 12 18 15 23   SpO2: 100% 99% 98% 100%    Medications  morphine 4 MG/ML  injection 4 mg (4 mg Intravenous Given 12/22/13 1054)  0.9 %  sodium chloride infusion ( Intravenous New Bag/Given 12/22/13 1056)  ondansetron (ZOFRAN) injection 4 mg (4 mg Intravenous Given 12/22/13 1055)  metroNIDAZOLE (FLAGYL) IVPB 500 mg (0 mg Intravenous Stopped 12/22/13 1305)  ciprofloxacin (CIPRO) IVPB 400 mg (400 mg Intravenous New Bag/Given 12/22/13 1306)  ondansetron (ZOFRAN)  injection 4 mg (4 mg Intravenous Given 12/22/13 1151)    Carl Found Sr. is a 53 y.o. male presenting with lower quadrant and epigastric pain consistent with prior episodes of diverticulitis. Serial abdominal exams are benign, patient is tolerating by mouth. Blood work without significant leukocytosis or other abnormalities. Lipase is drawn and elevated however this is likely secondary to his poor renal function. Patient's pain and tachycardia have resolved in the ED. Patient will be started on antibiotics for diverticulitis and extensive discussion of return precautions.  This is a shared visit with the attending physician who personally evaluated the patient and agrees with the care plan.   Evaluation does not show pathology that would require ongoing emergent intervention or inpatient treatment. Pt is hemodynamically stable and mentating appropriately. Discussed findings and plan with patient/guardian, who agrees with care plan. All questions answered. Return precautions discussed and outpatient follow up given.   New Prescriptions   CIPROFLOXACIN (CIPRO) 500 MG TABLET    Take 1 tablet (500 mg total) by mouth daily.   METRONIDAZOLE (FLAGYL) 500 MG TABLET    Take 1 tablet (500 mg total) by mouth 2 (two) times daily. One tab PO bid x 10 days   PROMETHAZINE (PHENERGAN) 25 MG TABLET    Take 1 tablet (25 mg total) by mouth every 6 (six) hours as needed for nausea or vomiting.   TRAMADOL (ULTRAM) 50 MG TABLET    Take 1 tablet (50 mg total) by mouth every 6 (six) hours as needed.         Monico Blitz,  PA-C 12/22/13 [place], MD 12/22/13 [phone]

## 2013-12-22 NOTE — ED Notes (Signed)
Patient to be discharged s/p IV antibiotics.

## 2013-12-22 NOTE — ED Notes (Signed)
Pt  Reports      abd   Pain   With  Nausea   Vomiting yest  After  Dialysis        History  Of     Diverticulitis          Pt  Reports       Pain  Is  intermittant         He    Is  Speaking in  Complete  sentances

## 2013-12-22 NOTE — ED Provider Notes (Addendum)
Patient reports she started getting lower abdominal pain yesterday about 2 PM. He states the pain is bilateral but is worse on the left. He has nausea and vomited 6 times yesterday but not today. He denies diarrhea. He is unsure of fever. He states he was admitted to the hospital in July for diverticulitis and this feels similar.  Patient is awake and cooperative. He has active bowel sounds. On exam his abdomen is soft without guarding or rebound. He denotes pain in the right and left lower abdomen without localization.   Review of patient's abdominal pelvis CT scan done in July shows he had sigmoid diverticulitis without abscess or perforation.  I discussed with patient that we could do another CT scan to verify the diverticulitis, however at this point I do not suspect he has a complication such as a abscess or perforation. Patient has chosen to start on antibiotics and he will return if he feels worse.  Medical screening examination/treatment/procedure(s) were conducted as a shared visit with non-physician practitioner(s) and myself.  I personally evaluated the patient during the encounter.   EKG Interpretation None       Rolland [name], MD, FACEP   Janice Norrie, MD 12/22/13 Luis Lopez Detrich Rakestraw, MD 12/22/13 1200

## 2013-12-22 NOTE — ED Notes (Signed)
Pt has diverticulitis and thinks this might be the problem. Started having abd pain yesterday and vomiting. No diarrhea.

## 2013-12-22 NOTE — ED Provider Notes (Signed)
CSN: [id number]     Arrival date & time 12/22/13  [id number] History   First MD Initiated Contact with Patient 12/22/13 [phone]     Chief Complaint  Patient presents with  . Abdominal Pain   (Consider location/radiation/quality/duration/timing/severity/associated sxs/prior Treatment) Patient is a 53 y.o. male presenting with abdominal pain. The history is provided by the patient.  Abdominal Pain Pain location:  LLQ and RLQ Pain quality: cramping   Pain severity:  Moderate Progression:  Improving Chronicity:  Recurrent Context comment:  Eval 7/23 with acute divert, given abx and sx resolved, relapsed last eve with n/v. Associated symptoms: nausea and vomiting   Associated symptoms: no constipation, no diarrhea, no dysuria, no fever, no hematemesis and no hematochezia     Past Medical History  Diagnosis Date  . Hypertension   . Anemia   . Chronic headaches   . Sleep apnea   . Chronic kidney disease     Chronic Renal Failure; On Renal Transplant List  . Presence of arteriovenous fistula for hemodialysis, primary     RUE  . Hemodialysis patient     M-W-F   Past Surgical History  Procedure Laterality Date  . Av fistula placement Right     right arm  . Graft left arm Left     for dialysis x 2  . Insertion of dialysis catheter      Rt chest   Family History  Problem Relation Age of Onset  . Diabetes Father   . Uterine cancer Mother   . Stroke Father   . Hypertension Father   . Lupus Sister   . Stroke Sister   . Anuerysm Brother     brain  . Hypertension Sister    History  Substance Use Topics  . Smoking status: Former Smoker    Types: Cigars    Quit date: 06/19/1990  . Smokeless tobacco: Not on file  . Alcohol Use: 0.0 oz/week     Comment: occasional    Review of Systems  Constitutional: Negative.  Negative for fever.  Gastrointestinal: Positive for nausea, vomiting and abdominal pain. Negative for diarrhea, constipation, blood in stool, hematochezia and  hematemesis.  Genitourinary: Negative for dysuria.    Allergies  Infed; Oxycodone; and Vicodin  Home Medications   Prior to Admission medications   Medication Sig Start Date End Date Taking? Authorizing Provider  BAYER ASPIRIN PO Take 2 tablets by mouth daily as needed (for headache).    Historical Provider, MD  calcium acetate (PHOSLO) 667 MG capsule Take 3,335 mg by mouth 3 (three) times daily with meals.     Historical Provider, MD  cinacalcet (SENSIPAR) 60 MG tablet Take 120 mg by mouth daily.     Historical Provider, MD  ciprofloxacin (CIPRO) 500 MG tablet Take 1 tablet (500 mg total) by mouth 2 (two) times daily. One po bid x 7 days 09/03/13   Veryl Speak, MD  gabapentin (NEURONTIN) 300 MG capsule Take 300 mg by mouth daily.     Historical Provider, MD  ibuprofen (ADVIL,MOTRIN) 600 MG tablet Take 600 mg by mouth every 8 (eight) hours as needed. For headache or pain 06/24/13   Historical Provider, MD  metroNIDAZOLE (FLAGYL) 500 MG tablet Take 1 tablet (500 mg total) by mouth 3 (three) times daily. 09/03/13   Veryl Speak, MD  Multiple Vitamin (MULTI VITAMIN MENS PO) Take 1 tablet by mouth daily.      Historical Provider, MD  multivitamin (RENA-VIT) TABS tablet Take 1 tablet by mouth  daily.    Historical Provider, MD  omeprazole (PRILOSEC) 20 MG capsule Take 20 mg by mouth daily as needed (for acid reflux).    Historical Provider, MD  oxyCODONE-acetaminophen (PERCOCET) 5-325 MG per tablet Take 2 tablets by mouth every 4 (four) hours as needed. 09/03/13   Veryl Speak, MD  promethazine (PHENERGAN) 25 MG tablet Take 1 tablet (25 mg total) by mouth every 6 (six) hours as needed for nausea. 09/03/13   Veryl Speak, MD  Sorbitol SOLN Take 60 mLs by mouth daily as needed (for constipation).     Historical Provider, MD   BP 131/92 mmHg  Pulse 115  Temp(Src) 98.6 F (37 C) (Oral)  Resp 12  SpO2 96% Physical Exam  Constitutional: He is oriented to person, place, and time. He appears  well-developed and well-nourished. No distress.  Abdominal: Soft. Normal appearance. He exhibits no distension and no mass. Bowel sounds are decreased. There is no hepatosplenomegaly. There is tenderness in the epigastric area, suprapubic area and left lower quadrant. There is no rigidity, no rebound, no guarding, no CVA tenderness, no tenderness at McBurney's point and negative Murphy's sign.  Neurological: He is alert and oriented to person, place, and time.  Skin: Skin is warm and dry.  Nursing note and vitals reviewed.   ED Course  Procedures (including critical care time) Labs Review Labs Reviewed - No data to display  Imaging Review No results found.   MDM   1. Diverticulitis large intestine w/o perforation or abscess w/o bleeding    Sent for surgical consult re relapse of diverticulitis in pt on dialysis. H/o similar 7/23.    Billy Fischer, MD 12/22/13 [phone]

## 2013-12-22 NOTE — Discharge Instructions (Signed)
You must maintain a full liquid diet for 3 days.  Take vicodin for breakthrough pain, do not drink alcohol, drive, care for children or do other critical tasks while taking Vicodin.  Take your antibiotics as directed and to completion. You should never have any leftover antibiotics! Push fluids and stay well hydrated.  Do not drink alcohol while you are taking flagyl (metronidazole) because it will make you very sick.  Please take probiotics that you can get over-the-counter, asked your pharmacist for recommendations if needed.  Please follow with your primary care doctor in the next 2 days for a check-up. They must obtain records for further management.   Do not hesitate to return to the Emergency Department for any new, worsening or concerning symptoms including worsening abdominal pain, intractable vomiting or any other new or concerning symptoms.     Abdominal Pain Many things can cause abdominal pain. Usually, abdominal pain is not caused by a disease and will improve without treatment. It can often be observed and treated at home. Your health care provider will do a physical exam and possibly order blood tests and X-rays to help determine the seriousness of your pain. However, in many cases, more time must pass before a clear cause of the pain can be found. Before that point, your health care provider may not know if you need more testing or further treatment. HOME CARE INSTRUCTIONS  Monitor your abdominal pain for any changes. The following actions may help to alleviate any discomfort you are experiencing:  Only take over-the-counter or prescription medicines as directed by your health care provider.  Do not take laxatives unless directed to do so by your health care provider.  Try a clear liquid diet (broth, tea, or water) as directed by your health care provider. Slowly move to a bland diet as tolerated. SEEK MEDICAL CARE IF:  You have unexplained abdominal pain.  You have  abdominal pain associated with nausea or diarrhea.  You have pain when you urinate or have a bowel movement.  You experience abdominal pain that wakes you in the night.  You have abdominal pain that is worsened or improved by eating food.  You have abdominal pain that is worsened with eating fatty foods.  You have a fever. SEEK IMMEDIATE MEDICAL CARE IF:   Your pain does not go away within 2 hours.  You keep throwing up (vomiting).  Your pain is felt only in portions of the abdomen, such as the right side or the left lower portion of the abdomen.  You pass bloody or black tarry stools. MAKE SURE YOU:  Understand these instructions.   Will watch your condition.   Will get help right away if you are not doing well or get worse.  Document Released: 11/08/2004 Document Revised: 02/03/2013 Document Reviewed: 10/08/2012 [facility] Patient Information 2015 [place], [place]. This information is not intended to replace advice given to you by your health care provider. Make sure you discuss any questions you have with your health care provider.    Emergency Department Resource Guide 1) Find a Doctor and Pay Out of Pocket Although you won't have to find out who is covered by your insurance plan, it is a good idea to ask around and get recommendations. You will then need to call the office and see if the doctor you have chosen will accept you as a new patient and what types of options they offer for patients who are self-pay. Some doctors offer discounts or will set up  payment plans for their patients who do not have insurance, but you will need to ask so you aren't surprised when you get to your appointment.  2) Contact Your Local Health Department Not all health departments have doctors that can see patients for sick visits, but many do, so it is worth a call to see if yours does. If you don't know where your local health department is, you can check in your phone book. The CDC also has a  tool to help you locate your state's health department, and many state websites also have listings of all of their local health departments.  3) Find a [facility] If your illness is not likely to be very severe or complicated, you may want to try a walk in clinic. These are popping up all over the country in pharmacies, drugstores, and shopping centers. They're usually staffed by nurse practitioners or physician assistants that have been trained to treat common illnesses and complaints. They're usually fairly quick and inexpensive. However, if you have serious medical issues or chronic medical problems, these are probably not your best option.  No Primary Care Doctor: - Call Health Connect at  [phone] - they can help you locate a primary care doctor that  accepts your insurance, provides certain services, etc. - Physician Referral Service- [phone]  Chronic Pain Problems: Organization         Address  Phone   Notes  [facility]  [phone] Patients need to be referred by their primary care doctor.   Medication Assistance: Organization         Address  Phone   Notes  [facility] Medication [facility] [address] [place], [place] [address] [phone] --Must be a resident of [facility] -- Must have NO insurance coverage whatsoever (no Medicaid/ Medicare, etc.) -- The pt. MUST have a primary care doctor that directs their care regularly and follows them in the community   MedAssist  [phone]   [employer]  [phone]    Agencies that provide inexpensive medical care: Organization         Address  Phone   Notes  [place]  [phone]   [facility]    [phone]   [facility] [address] [place], [place] [address] [phone]   [place] [address] [place] [phone]   Planned Parenthood    [phone]    [facility]    [phone]   [place] and [address], [place] Phone:  [phone], Fax:  [phone] Hours of Operation:  9 am - 6 pm, M-F.  Also accepts Medicaid/Medicare and self-pay.  [facility] for [place] [address], [address], [place] Phone: [phone], Fax: [phone]. Hours of Operation:  8:30 am - 5:30 pm, M-F.  Also accepts Medicaid and self-pay.  [facility] [place] [address] [place] Phone: [phone]   [place], [place], [place] [phone], Ext. [phone] Mondays & Thursdays: 7-9 AM.  First 15 patients are seen on a first come, first serve basis.    [place] Providers:  Organization         Address  Phone   Notes  [facility] [address], [place] [phone] Also accepts  self-pay patients.  [facility] [id number] [place], [place]  [phone]   [place], [address], [place] [phone]   [facility] [address], [place] [phone]   Lucianne Lei [address], [place]   [phone] Only accepts [place] Access [place] patients after they have their name applied to their card.   Self-Pay (no insurance) in [facility]:  Organization         Address  Phone   Notes  Sickle Cell Patients, [facility] Internal Medicine [place] [phone]   [facility] Urgent Care [place] [phone]   [facility] [place]  [place], [place], [place] [phone]   [facility]/Dr. Osei-Bonsu  [address] [place] or [address], [place] [phone] Phone number for both [place] and [place] locations is the same.  Urgent [facility] and [facility] [address] [place] [phone]   [facility] [address], [place] or [address] [phone]   [facility] [address], [place] [phone], phone; [phone], fax Sees patients 1st and 3rd Saturday of every month.  Must not qualify for public or private insurance (i.e. Medicaid, Medicare, [place] Health Choice, Veterans' Benefits)  Household income should be no more than 200% of the poverty level The clinic cannot treat you if you are pregnant or think you are pregnant  Sexually transmitted diseases are not treated at the clinic.    Dental Care: Organization         Address  Phone  Notes  [facility] Department of [facility] [place] [phone] Accepts children up to age 31 who are enrolled in [place] or [place]; pregnant women with a Medicaid card; and children who have applied for Medicaid or [place] Health Choice, but were declined, whose parents can pay a reduced fee at time of service.  [facility] Department of [facility]  [address], [place] [phone] Accepts children up to age 34 who are enrolled in [place] or [place]; pregnant women with a Medicaid card; and children who have applied for Medicaid or  Health Choice, but were declined, whose parents can pay a reduced fee at time of service.  [place] Access PROGRAM  [place] [phone] Patients are seen by appointment only. Walk-ins are not accepted. [place] will see patients 54 years of age and older. Monday - Tuesday (8am-5pm) Most Wednesdays (8:30-5pm) $30 per visit, cash only  [facility] Access PROGRAM  [address], [facility] [phone] Patients are seen by appointment only. Walk-ins are not accepted. [place] will see patients 48 years of age and older. One Wednesday  Evening (Monthly: Volunteer Based).  $30 per visit, cash only  [place]  [phone] for adults; Children under age 72, call Graduate Pediatric Dentistry at [phone]. Children aged 64-14, please call [phone] to request a pediatric application.  Dental services are provided in all areas of dental care including fillings, crowns and bridges, complete and partial dentures, implants, gum treatment, root canals, and extractions. Preventive care is also provided. Treatment is provided  to both adults and children. Patients are selected via a lottery and there is often a waiting list.   [facility] ([place]) [address] [place]  [phone] [email or web address]   [facility] [address] [place], [place] [phone], Ext. [phone] Second and Fourth Thursday of each month, opens at 6:30 AM; Clinic ends at 9 AM.  Patients are seen on a first-come first-served basis, and a limited number are seen during each clinic.   [facility]  [address] Hillard Danker [place], [place] [phone]   Eligibility Requirements You must have lived in [place], [place], or [place] counties for at least the last three months.   You cannot be eligible for state or federal sponsored [employer], including [employer], [place], or [employer].   You generally cannot be eligible for healthcare insurance through your employer.    How to apply: Eligibility screenings are held every Tuesday and Wednesday afternoon from 1:00 pm until 4:00 pm. You do not need an appointment for the interview!  [facility] [address] [place], [place]   [place]  [place] Department  [place]  [phone]    Behavioral Health Resources in the Community: Intensive Outpatient Programs Organization         Address  Phone  Notes  [place]. [address] [place], [place] [phone]   [facility] Outpatient [address] [place], [place]   ADS: Alcohol & Drug Svcs [address] [place], [address] [place]   [place] [address],  [place], [place] or [phone]   Substance Abuse Resources Organization         Address  Phone  Notes  Alcohol and Drug Services  [phone]   [place]  [phone]   [facility] [place]   Chinita Pester  [phone]   Residential & Outpatient Substance Abuse Program  [phone]   Psychological Services Organization         Address  Phone  Notes  [facility] [place]  [place]  [phone]   [place] [address] [place] or [phone]    Mobile Crisis Teams Organization         Address  Phone  Notes  [facility], Mobile Crisis Care Unit  [phone]   [facility] Psychotherapeutic Services  [address] [place], [place]   [place] [address] [place] [phone]    Self-Help/Support Groups Organization         Address  Phone             Notes  [place]. of [place] - variety of support groups  [place] Call for more information  Narcotics Anonymous (NA), [facility] [address], [employer] [place]  2 meetings at this location   Special educational needs teacher         Address  Phone  Notes  [facility]idential Treatment [place],    [place]  [phone]   [facility]  [address], [place] [id number], [place], [place]   [place], [place] [phone] Admissions: 8am-3pm M-F  Incentives Substance [place] [phone],    [place], [place] [phone]  [facility] [address] Jadene Pierini  [place], [place]   The [place].,  [place], [place]   [facility] - Intensive Outpatient [address] Kristeen [address], [place], [place]   [facility] ([place] [address]  [place], [place] [phone] or [phone]   [facility] Services (RTS) [address] [place], [place] Accepts Medicaid  [facility] [address]  [place] [phone] Substance Abuse/Addiction Treatment   [facility] Organization         Address  Phone  Notes  [facility]  [phone]   Domenic Schwab, PhD [address] Arlis [place], [place]   [phone] or [phone]   [place], [place] [phone]   [facility] [address] [place], [place] [phone] Insurance/Medicaid/sponsorship through [facility] [address], [address] [place]                                    [place], [place] [phone] [place] [address], [place] [phone]    Dr. Adele Schilder  [phone]   [facility] Dept. 1) [address], [place] 2) [place] 3)  [place], [place] [phone]  [phone]   [place] [phone] or [phone] (After Hours)

## 2014-01-11 DIAGNOSIS — Z992 Dependence on renal dialysis: Secondary | ICD-10-CM | POA: Diagnosis not present

## 2014-01-11 DIAGNOSIS — N186 End stage renal disease: Secondary | ICD-10-CM | POA: Diagnosis not present

## 2014-01-13 DIAGNOSIS — N2581 Secondary hyperparathyroidism of renal origin: Secondary | ICD-10-CM | POA: Diagnosis not present

## 2014-01-13 DIAGNOSIS — E1129 Type 2 diabetes mellitus with other diabetic kidney complication: Secondary | ICD-10-CM | POA: Diagnosis not present

## 2014-01-13 DIAGNOSIS — D631 Anemia in chronic kidney disease: Secondary | ICD-10-CM | POA: Diagnosis not present

## 2014-01-13 DIAGNOSIS — N186 End stage renal disease: Secondary | ICD-10-CM | POA: Diagnosis not present

## 2014-01-13 DIAGNOSIS — E875 Hyperkalemia: Secondary | ICD-10-CM | POA: Diagnosis not present

## 2014-01-15 DIAGNOSIS — N2581 Secondary hyperparathyroidism of renal origin: Secondary | ICD-10-CM | POA: Diagnosis not present

## 2014-01-15 DIAGNOSIS — E875 Hyperkalemia: Secondary | ICD-10-CM | POA: Diagnosis not present

## 2014-01-15 DIAGNOSIS — E1129 Type 2 diabetes mellitus with other diabetic kidney complication: Secondary | ICD-10-CM | POA: Diagnosis not present

## 2014-01-15 DIAGNOSIS — N186 End stage renal disease: Secondary | ICD-10-CM | POA: Diagnosis not present

## 2014-01-15 DIAGNOSIS — D631 Anemia in chronic kidney disease: Secondary | ICD-10-CM | POA: Diagnosis not present

## 2014-01-18 DIAGNOSIS — N186 End stage renal disease: Secondary | ICD-10-CM | POA: Diagnosis not present

## 2014-01-18 DIAGNOSIS — E1129 Type 2 diabetes mellitus with other diabetic kidney complication: Secondary | ICD-10-CM | POA: Diagnosis not present

## 2014-01-18 DIAGNOSIS — N2581 Secondary hyperparathyroidism of renal origin: Secondary | ICD-10-CM | POA: Diagnosis not present

## 2014-01-18 DIAGNOSIS — E875 Hyperkalemia: Secondary | ICD-10-CM | POA: Diagnosis not present

## 2014-01-18 DIAGNOSIS — D631 Anemia in chronic kidney disease: Secondary | ICD-10-CM | POA: Diagnosis not present

## 2014-01-20 DIAGNOSIS — E1129 Type 2 diabetes mellitus with other diabetic kidney complication: Secondary | ICD-10-CM | POA: Diagnosis not present

## 2014-01-20 DIAGNOSIS — E875 Hyperkalemia: Secondary | ICD-10-CM | POA: Diagnosis not present

## 2014-01-20 DIAGNOSIS — N186 End stage renal disease: Secondary | ICD-10-CM | POA: Diagnosis not present

## 2014-01-20 DIAGNOSIS — D631 Anemia in chronic kidney disease: Secondary | ICD-10-CM | POA: Diagnosis not present

## 2014-01-20 DIAGNOSIS — N2581 Secondary hyperparathyroidism of renal origin: Secondary | ICD-10-CM | POA: Diagnosis not present

## 2014-01-23 DIAGNOSIS — N186 End stage renal disease: Secondary | ICD-10-CM | POA: Diagnosis not present

## 2014-01-23 DIAGNOSIS — N2581 Secondary hyperparathyroidism of renal origin: Secondary | ICD-10-CM | POA: Diagnosis not present

## 2014-01-23 DIAGNOSIS — D631 Anemia in chronic kidney disease: Secondary | ICD-10-CM | POA: Diagnosis not present

## 2014-01-23 DIAGNOSIS — E1129 Type 2 diabetes mellitus with other diabetic kidney complication: Secondary | ICD-10-CM | POA: Diagnosis not present

## 2014-01-23 DIAGNOSIS — E875 Hyperkalemia: Secondary | ICD-10-CM | POA: Diagnosis not present

## 2014-01-25 DIAGNOSIS — D631 Anemia in chronic kidney disease: Secondary | ICD-10-CM | POA: Diagnosis not present

## 2014-01-25 DIAGNOSIS — N2581 Secondary hyperparathyroidism of renal origin: Secondary | ICD-10-CM | POA: Diagnosis not present

## 2014-01-25 DIAGNOSIS — E875 Hyperkalemia: Secondary | ICD-10-CM | POA: Diagnosis not present

## 2014-01-25 DIAGNOSIS — N186 End stage renal disease: Secondary | ICD-10-CM | POA: Diagnosis not present

## 2014-01-25 DIAGNOSIS — E1129 Type 2 diabetes mellitus with other diabetic kidney complication: Secondary | ICD-10-CM | POA: Diagnosis not present

## 2014-01-27 DIAGNOSIS — D631 Anemia in chronic kidney disease: Secondary | ICD-10-CM | POA: Diagnosis not present

## 2014-01-27 DIAGNOSIS — N186 End stage renal disease: Secondary | ICD-10-CM | POA: Diagnosis not present

## 2014-01-27 DIAGNOSIS — N2581 Secondary hyperparathyroidism of renal origin: Secondary | ICD-10-CM | POA: Diagnosis not present

## 2014-01-27 DIAGNOSIS — E1129 Type 2 diabetes mellitus with other diabetic kidney complication: Secondary | ICD-10-CM | POA: Diagnosis not present

## 2014-01-27 DIAGNOSIS — E875 Hyperkalemia: Secondary | ICD-10-CM | POA: Diagnosis not present

## 2014-01-29 DIAGNOSIS — N186 End stage renal disease: Secondary | ICD-10-CM | POA: Diagnosis not present

## 2014-01-29 DIAGNOSIS — E875 Hyperkalemia: Secondary | ICD-10-CM | POA: Diagnosis not present

## 2014-01-29 DIAGNOSIS — N2581 Secondary hyperparathyroidism of renal origin: Secondary | ICD-10-CM | POA: Diagnosis not present

## 2014-01-29 DIAGNOSIS — E1129 Type 2 diabetes mellitus with other diabetic kidney complication: Secondary | ICD-10-CM | POA: Diagnosis not present

## 2014-01-29 DIAGNOSIS — D631 Anemia in chronic kidney disease: Secondary | ICD-10-CM | POA: Diagnosis not present

## 2014-02-01 DIAGNOSIS — D631 Anemia in chronic kidney disease: Secondary | ICD-10-CM | POA: Diagnosis not present

## 2014-02-01 DIAGNOSIS — N2581 Secondary hyperparathyroidism of renal origin: Secondary | ICD-10-CM | POA: Diagnosis not present

## 2014-02-01 DIAGNOSIS — E1129 Type 2 diabetes mellitus with other diabetic kidney complication: Secondary | ICD-10-CM | POA: Diagnosis not present

## 2014-02-01 DIAGNOSIS — E875 Hyperkalemia: Secondary | ICD-10-CM | POA: Diagnosis not present

## 2014-02-01 DIAGNOSIS — N186 End stage renal disease: Secondary | ICD-10-CM | POA: Diagnosis not present

## 2014-02-03 DIAGNOSIS — D631 Anemia in chronic kidney disease: Secondary | ICD-10-CM | POA: Diagnosis not present

## 2014-02-03 DIAGNOSIS — N186 End stage renal disease: Secondary | ICD-10-CM | POA: Diagnosis not present

## 2014-02-03 DIAGNOSIS — E1129 Type 2 diabetes mellitus with other diabetic kidney complication: Secondary | ICD-10-CM | POA: Diagnosis not present

## 2014-02-03 DIAGNOSIS — E875 Hyperkalemia: Secondary | ICD-10-CM | POA: Diagnosis not present

## 2014-02-03 DIAGNOSIS — N2581 Secondary hyperparathyroidism of renal origin: Secondary | ICD-10-CM | POA: Diagnosis not present

## 2014-02-06 DIAGNOSIS — E875 Hyperkalemia: Secondary | ICD-10-CM | POA: Diagnosis not present

## 2014-02-06 DIAGNOSIS — E1129 Type 2 diabetes mellitus with other diabetic kidney complication: Secondary | ICD-10-CM | POA: Diagnosis not present

## 2014-02-06 DIAGNOSIS — D631 Anemia in chronic kidney disease: Secondary | ICD-10-CM | POA: Diagnosis not present

## 2014-02-06 DIAGNOSIS — N2581 Secondary hyperparathyroidism of renal origin: Secondary | ICD-10-CM | POA: Diagnosis not present

## 2014-02-06 DIAGNOSIS — N186 End stage renal disease: Secondary | ICD-10-CM | POA: Diagnosis not present

## 2014-02-08 DIAGNOSIS — N2581 Secondary hyperparathyroidism of renal origin: Secondary | ICD-10-CM | POA: Diagnosis not present

## 2014-02-08 DIAGNOSIS — D631 Anemia in chronic kidney disease: Secondary | ICD-10-CM | POA: Diagnosis not present

## 2014-02-08 DIAGNOSIS — E875 Hyperkalemia: Secondary | ICD-10-CM | POA: Diagnosis not present

## 2014-02-08 DIAGNOSIS — E1129 Type 2 diabetes mellitus with other diabetic kidney complication: Secondary | ICD-10-CM | POA: Diagnosis not present

## 2014-02-08 DIAGNOSIS — N186 End stage renal disease: Secondary | ICD-10-CM | POA: Diagnosis not present

## 2014-02-10 DIAGNOSIS — E1129 Type 2 diabetes mellitus with other diabetic kidney complication: Secondary | ICD-10-CM | POA: Diagnosis not present

## 2014-02-10 DIAGNOSIS — N186 End stage renal disease: Secondary | ICD-10-CM | POA: Diagnosis not present

## 2014-02-10 DIAGNOSIS — E875 Hyperkalemia: Secondary | ICD-10-CM | POA: Diagnosis not present

## 2014-02-10 DIAGNOSIS — D631 Anemia in chronic kidney disease: Secondary | ICD-10-CM | POA: Diagnosis not present

## 2014-02-10 DIAGNOSIS — N2581 Secondary hyperparathyroidism of renal origin: Secondary | ICD-10-CM | POA: Diagnosis not present

## 2014-02-11 DIAGNOSIS — Z992 Dependence on renal dialysis: Secondary | ICD-10-CM | POA: Diagnosis not present

## 2014-02-11 DIAGNOSIS — N186 End stage renal disease: Secondary | ICD-10-CM | POA: Diagnosis not present

## 2014-02-13 DIAGNOSIS — E875 Hyperkalemia: Secondary | ICD-10-CM | POA: Diagnosis not present

## 2014-02-13 DIAGNOSIS — N2581 Secondary hyperparathyroidism of renal origin: Secondary | ICD-10-CM | POA: Diagnosis not present

## 2014-02-13 DIAGNOSIS — N186 End stage renal disease: Secondary | ICD-10-CM | POA: Diagnosis not present

## 2014-02-13 DIAGNOSIS — D631 Anemia in chronic kidney disease: Secondary | ICD-10-CM | POA: Diagnosis not present

## 2014-02-13 DIAGNOSIS — D509 Iron deficiency anemia, unspecified: Secondary | ICD-10-CM | POA: Diagnosis not present

## 2014-02-13 DIAGNOSIS — E1129 Type 2 diabetes mellitus with other diabetic kidney complication: Secondary | ICD-10-CM | POA: Diagnosis not present

## 2014-02-15 DIAGNOSIS — N2581 Secondary hyperparathyroidism of renal origin: Secondary | ICD-10-CM | POA: Diagnosis not present

## 2014-02-15 DIAGNOSIS — D631 Anemia in chronic kidney disease: Secondary | ICD-10-CM | POA: Diagnosis not present

## 2014-02-15 DIAGNOSIS — D509 Iron deficiency anemia, unspecified: Secondary | ICD-10-CM | POA: Diagnosis not present

## 2014-02-15 DIAGNOSIS — E1129 Type 2 diabetes mellitus with other diabetic kidney complication: Secondary | ICD-10-CM | POA: Diagnosis not present

## 2014-02-15 DIAGNOSIS — N186 End stage renal disease: Secondary | ICD-10-CM | POA: Diagnosis not present

## 2014-02-15 DIAGNOSIS — E875 Hyperkalemia: Secondary | ICD-10-CM | POA: Diagnosis not present

## 2014-02-17 DIAGNOSIS — E875 Hyperkalemia: Secondary | ICD-10-CM | POA: Diagnosis not present

## 2014-02-17 DIAGNOSIS — D509 Iron deficiency anemia, unspecified: Secondary | ICD-10-CM | POA: Diagnosis not present

## 2014-02-17 DIAGNOSIS — N2581 Secondary hyperparathyroidism of renal origin: Secondary | ICD-10-CM | POA: Diagnosis not present

## 2014-02-17 DIAGNOSIS — D631 Anemia in chronic kidney disease: Secondary | ICD-10-CM | POA: Diagnosis not present

## 2014-02-17 DIAGNOSIS — E1129 Type 2 diabetes mellitus with other diabetic kidney complication: Secondary | ICD-10-CM | POA: Diagnosis not present

## 2014-02-17 DIAGNOSIS — N186 End stage renal disease: Secondary | ICD-10-CM | POA: Diagnosis not present

## 2014-02-19 DIAGNOSIS — D631 Anemia in chronic kidney disease: Secondary | ICD-10-CM | POA: Diagnosis not present

## 2014-02-19 DIAGNOSIS — E1129 Type 2 diabetes mellitus with other diabetic kidney complication: Secondary | ICD-10-CM | POA: Diagnosis not present

## 2014-02-19 DIAGNOSIS — N186 End stage renal disease: Secondary | ICD-10-CM | POA: Diagnosis not present

## 2014-02-19 DIAGNOSIS — N2581 Secondary hyperparathyroidism of renal origin: Secondary | ICD-10-CM | POA: Diagnosis not present

## 2014-02-19 DIAGNOSIS — D509 Iron deficiency anemia, unspecified: Secondary | ICD-10-CM | POA: Diagnosis not present

## 2014-02-19 DIAGNOSIS — E875 Hyperkalemia: Secondary | ICD-10-CM | POA: Diagnosis not present

## 2014-02-22 DIAGNOSIS — N186 End stage renal disease: Secondary | ICD-10-CM | POA: Diagnosis not present

## 2014-02-22 DIAGNOSIS — N2581 Secondary hyperparathyroidism of renal origin: Secondary | ICD-10-CM | POA: Diagnosis not present

## 2014-02-22 DIAGNOSIS — E875 Hyperkalemia: Secondary | ICD-10-CM | POA: Diagnosis not present

## 2014-02-22 DIAGNOSIS — E1129 Type 2 diabetes mellitus with other diabetic kidney complication: Secondary | ICD-10-CM | POA: Diagnosis not present

## 2014-02-22 DIAGNOSIS — D631 Anemia in chronic kidney disease: Secondary | ICD-10-CM | POA: Diagnosis not present

## 2014-02-22 DIAGNOSIS — D509 Iron deficiency anemia, unspecified: Secondary | ICD-10-CM | POA: Diagnosis not present

## 2014-02-24 DIAGNOSIS — N186 End stage renal disease: Secondary | ICD-10-CM | POA: Diagnosis not present

## 2014-02-24 DIAGNOSIS — E1129 Type 2 diabetes mellitus with other diabetic kidney complication: Secondary | ICD-10-CM | POA: Diagnosis not present

## 2014-02-24 DIAGNOSIS — N2581 Secondary hyperparathyroidism of renal origin: Secondary | ICD-10-CM | POA: Diagnosis not present

## 2014-02-24 DIAGNOSIS — D631 Anemia in chronic kidney disease: Secondary | ICD-10-CM | POA: Diagnosis not present

## 2014-02-24 DIAGNOSIS — D509 Iron deficiency anemia, unspecified: Secondary | ICD-10-CM | POA: Diagnosis not present

## 2014-02-24 DIAGNOSIS — E875 Hyperkalemia: Secondary | ICD-10-CM | POA: Diagnosis not present

## 2014-02-26 DIAGNOSIS — N186 End stage renal disease: Secondary | ICD-10-CM | POA: Diagnosis not present

## 2014-02-26 DIAGNOSIS — D509 Iron deficiency anemia, unspecified: Secondary | ICD-10-CM | POA: Diagnosis not present

## 2014-02-26 DIAGNOSIS — E1129 Type 2 diabetes mellitus with other diabetic kidney complication: Secondary | ICD-10-CM | POA: Diagnosis not present

## 2014-02-26 DIAGNOSIS — N2581 Secondary hyperparathyroidism of renal origin: Secondary | ICD-10-CM | POA: Diagnosis not present

## 2014-02-26 DIAGNOSIS — D631 Anemia in chronic kidney disease: Secondary | ICD-10-CM | POA: Diagnosis not present

## 2014-02-26 DIAGNOSIS — E875 Hyperkalemia: Secondary | ICD-10-CM | POA: Diagnosis not present

## 2014-03-01 DIAGNOSIS — E875 Hyperkalemia: Secondary | ICD-10-CM | POA: Diagnosis not present

## 2014-03-01 DIAGNOSIS — D509 Iron deficiency anemia, unspecified: Secondary | ICD-10-CM | POA: Diagnosis not present

## 2014-03-01 DIAGNOSIS — N2581 Secondary hyperparathyroidism of renal origin: Secondary | ICD-10-CM | POA: Diagnosis not present

## 2014-03-01 DIAGNOSIS — D631 Anemia in chronic kidney disease: Secondary | ICD-10-CM | POA: Diagnosis not present

## 2014-03-01 DIAGNOSIS — E1129 Type 2 diabetes mellitus with other diabetic kidney complication: Secondary | ICD-10-CM | POA: Diagnosis not present

## 2014-03-01 DIAGNOSIS — N186 End stage renal disease: Secondary | ICD-10-CM | POA: Diagnosis not present

## 2014-03-03 DIAGNOSIS — E1129 Type 2 diabetes mellitus with other diabetic kidney complication: Secondary | ICD-10-CM | POA: Diagnosis not present

## 2014-03-03 DIAGNOSIS — D631 Anemia in chronic kidney disease: Secondary | ICD-10-CM | POA: Diagnosis not present

## 2014-03-03 DIAGNOSIS — D509 Iron deficiency anemia, unspecified: Secondary | ICD-10-CM | POA: Diagnosis not present

## 2014-03-03 DIAGNOSIS — E875 Hyperkalemia: Secondary | ICD-10-CM | POA: Diagnosis not present

## 2014-03-03 DIAGNOSIS — N2581 Secondary hyperparathyroidism of renal origin: Secondary | ICD-10-CM | POA: Diagnosis not present

## 2014-03-03 DIAGNOSIS — N186 End stage renal disease: Secondary | ICD-10-CM | POA: Diagnosis not present

## 2014-03-05 DIAGNOSIS — E875 Hyperkalemia: Secondary | ICD-10-CM | POA: Diagnosis not present

## 2014-03-05 DIAGNOSIS — N2581 Secondary hyperparathyroidism of renal origin: Secondary | ICD-10-CM | POA: Diagnosis not present

## 2014-03-05 DIAGNOSIS — D509 Iron deficiency anemia, unspecified: Secondary | ICD-10-CM | POA: Diagnosis not present

## 2014-03-05 DIAGNOSIS — D631 Anemia in chronic kidney disease: Secondary | ICD-10-CM | POA: Diagnosis not present

## 2014-03-05 DIAGNOSIS — E1129 Type 2 diabetes mellitus with other diabetic kidney complication: Secondary | ICD-10-CM | POA: Diagnosis not present

## 2014-03-05 DIAGNOSIS — N186 End stage renal disease: Secondary | ICD-10-CM | POA: Diagnosis not present

## 2014-03-08 DIAGNOSIS — D631 Anemia in chronic kidney disease: Secondary | ICD-10-CM | POA: Diagnosis not present

## 2014-03-08 DIAGNOSIS — E875 Hyperkalemia: Secondary | ICD-10-CM | POA: Diagnosis not present

## 2014-03-08 DIAGNOSIS — N186 End stage renal disease: Secondary | ICD-10-CM | POA: Diagnosis not present

## 2014-03-08 DIAGNOSIS — D509 Iron deficiency anemia, unspecified: Secondary | ICD-10-CM | POA: Diagnosis not present

## 2014-03-08 DIAGNOSIS — N2581 Secondary hyperparathyroidism of renal origin: Secondary | ICD-10-CM | POA: Diagnosis not present

## 2014-03-08 DIAGNOSIS — E1129 Type 2 diabetes mellitus with other diabetic kidney complication: Secondary | ICD-10-CM | POA: Diagnosis not present

## 2014-03-10 DIAGNOSIS — E875 Hyperkalemia: Secondary | ICD-10-CM | POA: Diagnosis not present

## 2014-03-10 DIAGNOSIS — N2581 Secondary hyperparathyroidism of renal origin: Secondary | ICD-10-CM | POA: Diagnosis not present

## 2014-03-10 DIAGNOSIS — E1129 Type 2 diabetes mellitus with other diabetic kidney complication: Secondary | ICD-10-CM | POA: Diagnosis not present

## 2014-03-10 DIAGNOSIS — D509 Iron deficiency anemia, unspecified: Secondary | ICD-10-CM | POA: Diagnosis not present

## 2014-03-10 DIAGNOSIS — N186 End stage renal disease: Secondary | ICD-10-CM | POA: Diagnosis not present

## 2014-03-10 DIAGNOSIS — D631 Anemia in chronic kidney disease: Secondary | ICD-10-CM | POA: Diagnosis not present

## 2014-03-12 DIAGNOSIS — N186 End stage renal disease: Secondary | ICD-10-CM | POA: Diagnosis not present

## 2014-03-12 DIAGNOSIS — N2581 Secondary hyperparathyroidism of renal origin: Secondary | ICD-10-CM | POA: Diagnosis not present

## 2014-03-12 DIAGNOSIS — E1129 Type 2 diabetes mellitus with other diabetic kidney complication: Secondary | ICD-10-CM | POA: Diagnosis not present

## 2014-03-12 DIAGNOSIS — D509 Iron deficiency anemia, unspecified: Secondary | ICD-10-CM | POA: Diagnosis not present

## 2014-03-12 DIAGNOSIS — D631 Anemia in chronic kidney disease: Secondary | ICD-10-CM | POA: Diagnosis not present

## 2014-03-12 DIAGNOSIS — E875 Hyperkalemia: Secondary | ICD-10-CM | POA: Diagnosis not present

## 2014-03-14 DIAGNOSIS — Z992 Dependence on renal dialysis: Secondary | ICD-10-CM | POA: Diagnosis not present

## 2014-03-14 DIAGNOSIS — N186 End stage renal disease: Secondary | ICD-10-CM | POA: Diagnosis not present

## 2014-03-15 DIAGNOSIS — D631 Anemia in chronic kidney disease: Secondary | ICD-10-CM | POA: Diagnosis not present

## 2014-03-15 DIAGNOSIS — D509 Iron deficiency anemia, unspecified: Secondary | ICD-10-CM | POA: Diagnosis not present

## 2014-03-15 DIAGNOSIS — N186 End stage renal disease: Secondary | ICD-10-CM | POA: Diagnosis not present

## 2014-03-15 DIAGNOSIS — E875 Hyperkalemia: Secondary | ICD-10-CM | POA: Diagnosis not present

## 2014-03-15 DIAGNOSIS — E1129 Type 2 diabetes mellitus with other diabetic kidney complication: Secondary | ICD-10-CM | POA: Diagnosis not present

## 2014-03-15 DIAGNOSIS — N2581 Secondary hyperparathyroidism of renal origin: Secondary | ICD-10-CM | POA: Diagnosis not present

## 2014-03-17 DIAGNOSIS — N2581 Secondary hyperparathyroidism of renal origin: Secondary | ICD-10-CM | POA: Diagnosis not present

## 2014-03-17 DIAGNOSIS — E875 Hyperkalemia: Secondary | ICD-10-CM | POA: Diagnosis not present

## 2014-03-17 DIAGNOSIS — D631 Anemia in chronic kidney disease: Secondary | ICD-10-CM | POA: Diagnosis not present

## 2014-03-17 DIAGNOSIS — E1129 Type 2 diabetes mellitus with other diabetic kidney complication: Secondary | ICD-10-CM | POA: Diagnosis not present

## 2014-03-17 DIAGNOSIS — N186 End stage renal disease: Secondary | ICD-10-CM | POA: Diagnosis not present

## 2014-03-17 DIAGNOSIS — D509 Iron deficiency anemia, unspecified: Secondary | ICD-10-CM | POA: Diagnosis not present

## 2014-03-19 DIAGNOSIS — N2581 Secondary hyperparathyroidism of renal origin: Secondary | ICD-10-CM | POA: Diagnosis not present

## 2014-03-19 DIAGNOSIS — E875 Hyperkalemia: Secondary | ICD-10-CM | POA: Diagnosis not present

## 2014-03-19 DIAGNOSIS — D509 Iron deficiency anemia, unspecified: Secondary | ICD-10-CM | POA: Diagnosis not present

## 2014-03-19 DIAGNOSIS — D631 Anemia in chronic kidney disease: Secondary | ICD-10-CM | POA: Diagnosis not present

## 2014-03-19 DIAGNOSIS — E1129 Type 2 diabetes mellitus with other diabetic kidney complication: Secondary | ICD-10-CM | POA: Diagnosis not present

## 2014-03-19 DIAGNOSIS — N186 End stage renal disease: Secondary | ICD-10-CM | POA: Diagnosis not present

## 2014-03-22 DIAGNOSIS — D631 Anemia in chronic kidney disease: Secondary | ICD-10-CM | POA: Diagnosis not present

## 2014-03-22 DIAGNOSIS — D509 Iron deficiency anemia, unspecified: Secondary | ICD-10-CM | POA: Diagnosis not present

## 2014-03-22 DIAGNOSIS — N186 End stage renal disease: Secondary | ICD-10-CM | POA: Diagnosis not present

## 2014-03-22 DIAGNOSIS — E875 Hyperkalemia: Secondary | ICD-10-CM | POA: Diagnosis not present

## 2014-03-22 DIAGNOSIS — E1129 Type 2 diabetes mellitus with other diabetic kidney complication: Secondary | ICD-10-CM | POA: Diagnosis not present

## 2014-03-22 DIAGNOSIS — N2581 Secondary hyperparathyroidism of renal origin: Secondary | ICD-10-CM | POA: Diagnosis not present

## 2014-03-24 DIAGNOSIS — N2581 Secondary hyperparathyroidism of renal origin: Secondary | ICD-10-CM | POA: Diagnosis not present

## 2014-03-24 DIAGNOSIS — D631 Anemia in chronic kidney disease: Secondary | ICD-10-CM | POA: Diagnosis not present

## 2014-03-24 DIAGNOSIS — E875 Hyperkalemia: Secondary | ICD-10-CM | POA: Diagnosis not present

## 2014-03-24 DIAGNOSIS — N186 End stage renal disease: Secondary | ICD-10-CM | POA: Diagnosis not present

## 2014-03-24 DIAGNOSIS — D509 Iron deficiency anemia, unspecified: Secondary | ICD-10-CM | POA: Diagnosis not present

## 2014-03-24 DIAGNOSIS — E1129 Type 2 diabetes mellitus with other diabetic kidney complication: Secondary | ICD-10-CM | POA: Diagnosis not present

## 2014-03-26 DIAGNOSIS — D509 Iron deficiency anemia, unspecified: Secondary | ICD-10-CM | POA: Diagnosis not present

## 2014-03-26 DIAGNOSIS — N2581 Secondary hyperparathyroidism of renal origin: Secondary | ICD-10-CM | POA: Diagnosis not present

## 2014-03-26 DIAGNOSIS — N186 End stage renal disease: Secondary | ICD-10-CM | POA: Diagnosis not present

## 2014-03-26 DIAGNOSIS — D631 Anemia in chronic kidney disease: Secondary | ICD-10-CM | POA: Diagnosis not present

## 2014-03-26 DIAGNOSIS — E875 Hyperkalemia: Secondary | ICD-10-CM | POA: Diagnosis not present

## 2014-03-26 DIAGNOSIS — E1129 Type 2 diabetes mellitus with other diabetic kidney complication: Secondary | ICD-10-CM | POA: Diagnosis not present

## 2014-03-29 DIAGNOSIS — D631 Anemia in chronic kidney disease: Secondary | ICD-10-CM | POA: Diagnosis not present

## 2014-03-29 DIAGNOSIS — D509 Iron deficiency anemia, unspecified: Secondary | ICD-10-CM | POA: Diagnosis not present

## 2014-03-29 DIAGNOSIS — E875 Hyperkalemia: Secondary | ICD-10-CM | POA: Diagnosis not present

## 2014-03-29 DIAGNOSIS — N186 End stage renal disease: Secondary | ICD-10-CM | POA: Diagnosis not present

## 2014-03-29 DIAGNOSIS — E1129 Type 2 diabetes mellitus with other diabetic kidney complication: Secondary | ICD-10-CM | POA: Diagnosis not present

## 2014-03-29 DIAGNOSIS — N2581 Secondary hyperparathyroidism of renal origin: Secondary | ICD-10-CM | POA: Diagnosis not present

## 2014-03-31 DIAGNOSIS — E875 Hyperkalemia: Secondary | ICD-10-CM | POA: Diagnosis not present

## 2014-03-31 DIAGNOSIS — E1129 Type 2 diabetes mellitus with other diabetic kidney complication: Secondary | ICD-10-CM | POA: Diagnosis not present

## 2014-03-31 DIAGNOSIS — N186 End stage renal disease: Secondary | ICD-10-CM | POA: Diagnosis not present

## 2014-03-31 DIAGNOSIS — D631 Anemia in chronic kidney disease: Secondary | ICD-10-CM | POA: Diagnosis not present

## 2014-03-31 DIAGNOSIS — N2581 Secondary hyperparathyroidism of renal origin: Secondary | ICD-10-CM | POA: Diagnosis not present

## 2014-03-31 DIAGNOSIS — D509 Iron deficiency anemia, unspecified: Secondary | ICD-10-CM | POA: Diagnosis not present

## 2014-04-02 DIAGNOSIS — E1129 Type 2 diabetes mellitus with other diabetic kidney complication: Secondary | ICD-10-CM | POA: Diagnosis not present

## 2014-04-02 DIAGNOSIS — D509 Iron deficiency anemia, unspecified: Secondary | ICD-10-CM | POA: Diagnosis not present

## 2014-04-02 DIAGNOSIS — N186 End stage renal disease: Secondary | ICD-10-CM | POA: Diagnosis not present

## 2014-04-02 DIAGNOSIS — N2581 Secondary hyperparathyroidism of renal origin: Secondary | ICD-10-CM | POA: Diagnosis not present

## 2014-04-02 DIAGNOSIS — E875 Hyperkalemia: Secondary | ICD-10-CM | POA: Diagnosis not present

## 2014-04-02 DIAGNOSIS — D631 Anemia in chronic kidney disease: Secondary | ICD-10-CM | POA: Diagnosis not present

## 2014-04-05 DIAGNOSIS — N2581 Secondary hyperparathyroidism of renal origin: Secondary | ICD-10-CM | POA: Diagnosis not present

## 2014-04-05 DIAGNOSIS — E1129 Type 2 diabetes mellitus with other diabetic kidney complication: Secondary | ICD-10-CM | POA: Diagnosis not present

## 2014-04-05 DIAGNOSIS — D631 Anemia in chronic kidney disease: Secondary | ICD-10-CM | POA: Diagnosis not present

## 2014-04-05 DIAGNOSIS — D509 Iron deficiency anemia, unspecified: Secondary | ICD-10-CM | POA: Diagnosis not present

## 2014-04-05 DIAGNOSIS — E875 Hyperkalemia: Secondary | ICD-10-CM | POA: Diagnosis not present

## 2014-04-05 DIAGNOSIS — N186 End stage renal disease: Secondary | ICD-10-CM | POA: Diagnosis not present

## 2014-04-07 DIAGNOSIS — N186 End stage renal disease: Secondary | ICD-10-CM | POA: Diagnosis not present

## 2014-04-07 DIAGNOSIS — D509 Iron deficiency anemia, unspecified: Secondary | ICD-10-CM | POA: Diagnosis not present

## 2014-04-07 DIAGNOSIS — D631 Anemia in chronic kidney disease: Secondary | ICD-10-CM | POA: Diagnosis not present

## 2014-04-07 DIAGNOSIS — E875 Hyperkalemia: Secondary | ICD-10-CM | POA: Diagnosis not present

## 2014-04-07 DIAGNOSIS — E1129 Type 2 diabetes mellitus with other diabetic kidney complication: Secondary | ICD-10-CM | POA: Diagnosis not present

## 2014-04-07 DIAGNOSIS — N2581 Secondary hyperparathyroidism of renal origin: Secondary | ICD-10-CM | POA: Diagnosis not present

## 2014-04-09 DIAGNOSIS — N2581 Secondary hyperparathyroidism of renal origin: Secondary | ICD-10-CM | POA: Diagnosis not present

## 2014-04-09 DIAGNOSIS — D509 Iron deficiency anemia, unspecified: Secondary | ICD-10-CM | POA: Diagnosis not present

## 2014-04-09 DIAGNOSIS — E1129 Type 2 diabetes mellitus with other diabetic kidney complication: Secondary | ICD-10-CM | POA: Diagnosis not present

## 2014-04-09 DIAGNOSIS — E875 Hyperkalemia: Secondary | ICD-10-CM | POA: Diagnosis not present

## 2014-04-09 DIAGNOSIS — N186 End stage renal disease: Secondary | ICD-10-CM | POA: Diagnosis not present

## 2014-04-09 DIAGNOSIS — D631 Anemia in chronic kidney disease: Secondary | ICD-10-CM | POA: Diagnosis not present

## 2014-04-12 DIAGNOSIS — Z992 Dependence on renal dialysis: Secondary | ICD-10-CM | POA: Diagnosis not present

## 2014-04-12 DIAGNOSIS — N186 End stage renal disease: Secondary | ICD-10-CM | POA: Diagnosis not present

## 2014-04-12 DIAGNOSIS — D509 Iron deficiency anemia, unspecified: Secondary | ICD-10-CM | POA: Diagnosis not present

## 2014-04-12 DIAGNOSIS — E875 Hyperkalemia: Secondary | ICD-10-CM | POA: Diagnosis not present

## 2014-04-12 DIAGNOSIS — D631 Anemia in chronic kidney disease: Secondary | ICD-10-CM | POA: Diagnosis not present

## 2014-04-12 DIAGNOSIS — N2581 Secondary hyperparathyroidism of renal origin: Secondary | ICD-10-CM | POA: Diagnosis not present

## 2014-04-12 DIAGNOSIS — E1129 Type 2 diabetes mellitus with other diabetic kidney complication: Secondary | ICD-10-CM | POA: Diagnosis not present

## 2014-04-14 DIAGNOSIS — N2581 Secondary hyperparathyroidism of renal origin: Secondary | ICD-10-CM | POA: Diagnosis not present

## 2014-04-14 DIAGNOSIS — N186 End stage renal disease: Secondary | ICD-10-CM | POA: Diagnosis not present

## 2014-04-14 DIAGNOSIS — E1129 Type 2 diabetes mellitus with other diabetic kidney complication: Secondary | ICD-10-CM | POA: Diagnosis not present

## 2014-04-14 DIAGNOSIS — D631 Anemia in chronic kidney disease: Secondary | ICD-10-CM | POA: Diagnosis not present

## 2014-04-14 DIAGNOSIS — E875 Hyperkalemia: Secondary | ICD-10-CM | POA: Diagnosis not present

## 2014-04-14 DIAGNOSIS — D509 Iron deficiency anemia, unspecified: Secondary | ICD-10-CM | POA: Diagnosis not present

## 2014-04-16 DIAGNOSIS — E1129 Type 2 diabetes mellitus with other diabetic kidney complication: Secondary | ICD-10-CM | POA: Diagnosis not present

## 2014-04-16 DIAGNOSIS — E875 Hyperkalemia: Secondary | ICD-10-CM | POA: Diagnosis not present

## 2014-04-16 DIAGNOSIS — N186 End stage renal disease: Secondary | ICD-10-CM | POA: Diagnosis not present

## 2014-04-16 DIAGNOSIS — N2581 Secondary hyperparathyroidism of renal origin: Secondary | ICD-10-CM | POA: Diagnosis not present

## 2014-04-16 DIAGNOSIS — D631 Anemia in chronic kidney disease: Secondary | ICD-10-CM | POA: Diagnosis not present

## 2014-04-16 DIAGNOSIS — D509 Iron deficiency anemia, unspecified: Secondary | ICD-10-CM | POA: Diagnosis not present

## 2014-04-20 DIAGNOSIS — E875 Hyperkalemia: Secondary | ICD-10-CM | POA: Diagnosis not present

## 2014-04-20 DIAGNOSIS — D509 Iron deficiency anemia, unspecified: Secondary | ICD-10-CM | POA: Diagnosis not present

## 2014-04-20 DIAGNOSIS — E1129 Type 2 diabetes mellitus with other diabetic kidney complication: Secondary | ICD-10-CM | POA: Diagnosis not present

## 2014-04-20 DIAGNOSIS — N2581 Secondary hyperparathyroidism of renal origin: Secondary | ICD-10-CM | POA: Diagnosis not present

## 2014-04-20 DIAGNOSIS — N186 End stage renal disease: Secondary | ICD-10-CM | POA: Diagnosis not present

## 2014-04-20 DIAGNOSIS — D631 Anemia in chronic kidney disease: Secondary | ICD-10-CM | POA: Diagnosis not present

## 2014-04-21 DIAGNOSIS — N186 End stage renal disease: Secondary | ICD-10-CM | POA: Diagnosis not present

## 2014-04-21 DIAGNOSIS — D631 Anemia in chronic kidney disease: Secondary | ICD-10-CM | POA: Diagnosis not present

## 2014-04-21 DIAGNOSIS — E875 Hyperkalemia: Secondary | ICD-10-CM | POA: Diagnosis not present

## 2014-04-21 DIAGNOSIS — E1129 Type 2 diabetes mellitus with other diabetic kidney complication: Secondary | ICD-10-CM | POA: Diagnosis not present

## 2014-04-21 DIAGNOSIS — N2581 Secondary hyperparathyroidism of renal origin: Secondary | ICD-10-CM | POA: Diagnosis not present

## 2014-04-21 DIAGNOSIS — D509 Iron deficiency anemia, unspecified: Secondary | ICD-10-CM | POA: Diagnosis not present

## 2014-04-23 DIAGNOSIS — D509 Iron deficiency anemia, unspecified: Secondary | ICD-10-CM | POA: Diagnosis not present

## 2014-04-23 DIAGNOSIS — N186 End stage renal disease: Secondary | ICD-10-CM | POA: Diagnosis not present

## 2014-04-23 DIAGNOSIS — N2581 Secondary hyperparathyroidism of renal origin: Secondary | ICD-10-CM | POA: Diagnosis not present

## 2014-04-23 DIAGNOSIS — E1129 Type 2 diabetes mellitus with other diabetic kidney complication: Secondary | ICD-10-CM | POA: Diagnosis not present

## 2014-04-23 DIAGNOSIS — D631 Anemia in chronic kidney disease: Secondary | ICD-10-CM | POA: Diagnosis not present

## 2014-04-23 DIAGNOSIS — E875 Hyperkalemia: Secondary | ICD-10-CM | POA: Diagnosis not present

## 2014-04-26 DIAGNOSIS — E1129 Type 2 diabetes mellitus with other diabetic kidney complication: Secondary | ICD-10-CM | POA: Diagnosis not present

## 2014-04-26 DIAGNOSIS — E875 Hyperkalemia: Secondary | ICD-10-CM | POA: Diagnosis not present

## 2014-04-26 DIAGNOSIS — N186 End stage renal disease: Secondary | ICD-10-CM | POA: Diagnosis not present

## 2014-04-26 DIAGNOSIS — N2581 Secondary hyperparathyroidism of renal origin: Secondary | ICD-10-CM | POA: Diagnosis not present

## 2014-04-26 DIAGNOSIS — D509 Iron deficiency anemia, unspecified: Secondary | ICD-10-CM | POA: Diagnosis not present

## 2014-04-26 DIAGNOSIS — D631 Anemia in chronic kidney disease: Secondary | ICD-10-CM | POA: Diagnosis not present

## 2014-04-28 DIAGNOSIS — D631 Anemia in chronic kidney disease: Secondary | ICD-10-CM | POA: Diagnosis not present

## 2014-04-28 DIAGNOSIS — E875 Hyperkalemia: Secondary | ICD-10-CM | POA: Diagnosis not present

## 2014-04-28 DIAGNOSIS — N186 End stage renal disease: Secondary | ICD-10-CM | POA: Diagnosis not present

## 2014-04-28 DIAGNOSIS — E1129 Type 2 diabetes mellitus with other diabetic kidney complication: Secondary | ICD-10-CM | POA: Diagnosis not present

## 2014-04-28 DIAGNOSIS — N2581 Secondary hyperparathyroidism of renal origin: Secondary | ICD-10-CM | POA: Diagnosis not present

## 2014-04-28 DIAGNOSIS — D509 Iron deficiency anemia, unspecified: Secondary | ICD-10-CM | POA: Diagnosis not present

## 2014-04-30 DIAGNOSIS — D509 Iron deficiency anemia, unspecified: Secondary | ICD-10-CM | POA: Diagnosis not present

## 2014-04-30 DIAGNOSIS — E875 Hyperkalemia: Secondary | ICD-10-CM | POA: Diagnosis not present

## 2014-04-30 DIAGNOSIS — E1129 Type 2 diabetes mellitus with other diabetic kidney complication: Secondary | ICD-10-CM | POA: Diagnosis not present

## 2014-04-30 DIAGNOSIS — N2581 Secondary hyperparathyroidism of renal origin: Secondary | ICD-10-CM | POA: Diagnosis not present

## 2014-04-30 DIAGNOSIS — D631 Anemia in chronic kidney disease: Secondary | ICD-10-CM | POA: Diagnosis not present

## 2014-04-30 DIAGNOSIS — N186 End stage renal disease: Secondary | ICD-10-CM | POA: Diagnosis not present

## 2014-05-03 DIAGNOSIS — D631 Anemia in chronic kidney disease: Secondary | ICD-10-CM | POA: Diagnosis not present

## 2014-05-03 DIAGNOSIS — D509 Iron deficiency anemia, unspecified: Secondary | ICD-10-CM | POA: Diagnosis not present

## 2014-05-03 DIAGNOSIS — N2581 Secondary hyperparathyroidism of renal origin: Secondary | ICD-10-CM | POA: Diagnosis not present

## 2014-05-03 DIAGNOSIS — N186 End stage renal disease: Secondary | ICD-10-CM | POA: Diagnosis not present

## 2014-05-03 DIAGNOSIS — E875 Hyperkalemia: Secondary | ICD-10-CM | POA: Diagnosis not present

## 2014-05-03 DIAGNOSIS — E1129 Type 2 diabetes mellitus with other diabetic kidney complication: Secondary | ICD-10-CM | POA: Diagnosis not present

## 2014-05-05 DIAGNOSIS — N186 End stage renal disease: Secondary | ICD-10-CM | POA: Diagnosis not present

## 2014-05-05 DIAGNOSIS — E1129 Type 2 diabetes mellitus with other diabetic kidney complication: Secondary | ICD-10-CM | POA: Diagnosis not present

## 2014-05-05 DIAGNOSIS — E875 Hyperkalemia: Secondary | ICD-10-CM | POA: Diagnosis not present

## 2014-05-05 DIAGNOSIS — D631 Anemia in chronic kidney disease: Secondary | ICD-10-CM | POA: Diagnosis not present

## 2014-05-05 DIAGNOSIS — N2581 Secondary hyperparathyroidism of renal origin: Secondary | ICD-10-CM | POA: Diagnosis not present

## 2014-05-05 DIAGNOSIS — D509 Iron deficiency anemia, unspecified: Secondary | ICD-10-CM | POA: Diagnosis not present

## 2014-05-07 DIAGNOSIS — E875 Hyperkalemia: Secondary | ICD-10-CM | POA: Diagnosis not present

## 2014-05-07 DIAGNOSIS — N2581 Secondary hyperparathyroidism of renal origin: Secondary | ICD-10-CM | POA: Diagnosis not present

## 2014-05-07 DIAGNOSIS — D631 Anemia in chronic kidney disease: Secondary | ICD-10-CM | POA: Diagnosis not present

## 2014-05-07 DIAGNOSIS — N186 End stage renal disease: Secondary | ICD-10-CM | POA: Diagnosis not present

## 2014-05-07 DIAGNOSIS — E1129 Type 2 diabetes mellitus with other diabetic kidney complication: Secondary | ICD-10-CM | POA: Diagnosis not present

## 2014-05-07 DIAGNOSIS — D509 Iron deficiency anemia, unspecified: Secondary | ICD-10-CM | POA: Diagnosis not present

## 2014-05-07 DIAGNOSIS — Z4901 Encounter for fitting and adjustment of extracorporeal dialysis catheter: Secondary | ICD-10-CM | POA: Insufficient documentation

## 2014-05-10 DIAGNOSIS — N186 End stage renal disease: Secondary | ICD-10-CM | POA: Diagnosis not present

## 2014-05-10 DIAGNOSIS — E1129 Type 2 diabetes mellitus with other diabetic kidney complication: Secondary | ICD-10-CM | POA: Diagnosis not present

## 2014-05-10 DIAGNOSIS — N2581 Secondary hyperparathyroidism of renal origin: Secondary | ICD-10-CM | POA: Diagnosis not present

## 2014-05-10 DIAGNOSIS — D631 Anemia in chronic kidney disease: Secondary | ICD-10-CM | POA: Diagnosis not present

## 2014-05-10 DIAGNOSIS — E875 Hyperkalemia: Secondary | ICD-10-CM | POA: Diagnosis not present

## 2014-05-10 DIAGNOSIS — D509 Iron deficiency anemia, unspecified: Secondary | ICD-10-CM | POA: Diagnosis not present

## 2014-05-12 DIAGNOSIS — N2581 Secondary hyperparathyroidism of renal origin: Secondary | ICD-10-CM | POA: Diagnosis not present

## 2014-05-12 DIAGNOSIS — E1129 Type 2 diabetes mellitus with other diabetic kidney complication: Secondary | ICD-10-CM | POA: Diagnosis not present

## 2014-05-12 DIAGNOSIS — E875 Hyperkalemia: Secondary | ICD-10-CM | POA: Diagnosis not present

## 2014-05-12 DIAGNOSIS — N186 End stage renal disease: Secondary | ICD-10-CM | POA: Diagnosis not present

## 2014-05-12 DIAGNOSIS — D509 Iron deficiency anemia, unspecified: Secondary | ICD-10-CM | POA: Diagnosis not present

## 2014-05-12 DIAGNOSIS — D631 Anemia in chronic kidney disease: Secondary | ICD-10-CM | POA: Diagnosis not present

## 2014-05-13 DIAGNOSIS — Z992 Dependence on renal dialysis: Secondary | ICD-10-CM | POA: Diagnosis not present

## 2014-05-13 DIAGNOSIS — N186 End stage renal disease: Secondary | ICD-10-CM | POA: Diagnosis not present

## 2014-05-13 DIAGNOSIS — I12 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 stage renal disease: Secondary | ICD-10-CM | POA: Diagnosis not present

## 2014-05-14 DIAGNOSIS — D631 Anemia in chronic kidney disease: Secondary | ICD-10-CM | POA: Diagnosis not present

## 2014-05-14 DIAGNOSIS — E875 Hyperkalemia: Secondary | ICD-10-CM | POA: Diagnosis not present

## 2014-05-14 DIAGNOSIS — N2581 Secondary hyperparathyroidism of renal origin: Secondary | ICD-10-CM | POA: Diagnosis not present

## 2014-05-14 DIAGNOSIS — D509 Iron deficiency anemia, unspecified: Secondary | ICD-10-CM | POA: Diagnosis not present

## 2014-05-14 DIAGNOSIS — N186 End stage renal disease: Secondary | ICD-10-CM | POA: Diagnosis not present

## 2014-05-17 DIAGNOSIS — D631 Anemia in chronic kidney disease: Secondary | ICD-10-CM | POA: Diagnosis not present

## 2014-05-17 DIAGNOSIS — N186 End stage renal disease: Secondary | ICD-10-CM | POA: Diagnosis not present

## 2014-05-17 DIAGNOSIS — E875 Hyperkalemia: Secondary | ICD-10-CM | POA: Diagnosis not present

## 2014-05-17 DIAGNOSIS — D509 Iron deficiency anemia, unspecified: Secondary | ICD-10-CM | POA: Diagnosis not present

## 2014-05-17 DIAGNOSIS — N2581 Secondary hyperparathyroidism of renal origin: Secondary | ICD-10-CM | POA: Diagnosis not present

## 2014-05-19 DIAGNOSIS — N186 End stage renal disease: Secondary | ICD-10-CM | POA: Diagnosis not present

## 2014-05-19 DIAGNOSIS — N2581 Secondary hyperparathyroidism of renal origin: Secondary | ICD-10-CM | POA: Diagnosis not present

## 2014-05-19 DIAGNOSIS — D509 Iron deficiency anemia, unspecified: Secondary | ICD-10-CM | POA: Diagnosis not present

## 2014-05-19 DIAGNOSIS — D631 Anemia in chronic kidney disease: Secondary | ICD-10-CM | POA: Diagnosis not present

## 2014-05-19 DIAGNOSIS — E875 Hyperkalemia: Secondary | ICD-10-CM | POA: Diagnosis not present

## 2014-05-21 DIAGNOSIS — E875 Hyperkalemia: Secondary | ICD-10-CM | POA: Diagnosis not present

## 2014-05-21 DIAGNOSIS — D631 Anemia in chronic kidney disease: Secondary | ICD-10-CM | POA: Diagnosis not present

## 2014-05-21 DIAGNOSIS — N2581 Secondary hyperparathyroidism of renal origin: Secondary | ICD-10-CM | POA: Diagnosis not present

## 2014-05-21 DIAGNOSIS — N186 End stage renal disease: Secondary | ICD-10-CM | POA: Diagnosis not present

## 2014-05-21 DIAGNOSIS — D509 Iron deficiency anemia, unspecified: Secondary | ICD-10-CM | POA: Diagnosis not present

## 2014-05-22 ENCOUNTER — Emergency Department (HOSPITAL_COMMUNITY): Payer: Medicare Other

## 2014-05-22 ENCOUNTER — Inpatient Hospital Stay (HOSPITAL_COMMUNITY)
Admission: EM | Admit: 2014-05-22 | Discharge: 2014-05-25 | DRG: 064 | Disposition: A | Discharge status: Home / Self Care | Payer: Medicare Other | Attending: Internal Medicine | Admitting: Internal Medicine

## 2014-05-22 ENCOUNTER — Encounter (HOSPITAL_COMMUNITY): Payer: Self-pay | Admitting: Emergency Medicine

## 2014-05-22 DIAGNOSIS — E785 Hyperlipidemia, unspecified: Secondary | ICD-10-CM | POA: Insufficient documentation

## 2014-05-22 DIAGNOSIS — R42 Dizziness and giddiness: Secondary | ICD-10-CM | POA: Diagnosis not present

## 2014-05-22 DIAGNOSIS — K219 Gastro-esophageal reflux disease without esophagitis: Secondary | ICD-10-CM | POA: Diagnosis present

## 2014-05-22 DIAGNOSIS — Z79899 Other long term (current) drug therapy: Secondary | ICD-10-CM

## 2014-05-22 DIAGNOSIS — I10 Essential (primary) hypertension: Secondary | ICD-10-CM | POA: Diagnosis present

## 2014-05-22 DIAGNOSIS — I639 Cerebral infarction, unspecified: Secondary | ICD-10-CM | POA: Diagnosis present

## 2014-05-22 DIAGNOSIS — G4730 Sleep apnea, unspecified: Secondary | ICD-10-CM | POA: Diagnosis present

## 2014-05-22 DIAGNOSIS — D638 Anemia in other chronic diseases classified elsewhere: Secondary | ICD-10-CM | POA: Diagnosis present

## 2014-05-22 DIAGNOSIS — I6783 Posterior reversible encephalopathy syndrome: Secondary | ICD-10-CM | POA: Insufficient documentation

## 2014-05-22 DIAGNOSIS — R531 Weakness: Secondary | ICD-10-CM | POA: Diagnosis not present

## 2014-05-22 DIAGNOSIS — G629 Polyneuropathy, unspecified: Secondary | ICD-10-CM | POA: Diagnosis present

## 2014-05-22 DIAGNOSIS — G8194 Hemiplegia, unspecified affecting left nondominant side: Secondary | ICD-10-CM | POA: Diagnosis not present

## 2014-05-22 DIAGNOSIS — N2581 Secondary hyperparathyroidism of renal origin: Secondary | ICD-10-CM | POA: Diagnosis present

## 2014-05-22 DIAGNOSIS — R260 Ataxic gait: Secondary | ICD-10-CM | POA: Diagnosis present

## 2014-05-22 DIAGNOSIS — D631 Anemia in chronic kidney disease: Secondary | ICD-10-CM | POA: Diagnosis present

## 2014-05-22 DIAGNOSIS — H538 Other visual disturbances: Secondary | ICD-10-CM | POA: Diagnosis not present

## 2014-05-22 DIAGNOSIS — N186 End stage renal disease: Secondary | ICD-10-CM

## 2014-05-22 DIAGNOSIS — I6359 Cerebral infarction due to unspecified occlusion or stenosis of other cerebral artery: Secondary | ICD-10-CM | POA: Diagnosis not present

## 2014-05-22 DIAGNOSIS — N189 Chronic kidney disease, unspecified: Secondary | ICD-10-CM

## 2014-05-22 DIAGNOSIS — D649 Anemia, unspecified: Secondary | ICD-10-CM | POA: Diagnosis present

## 2014-05-22 DIAGNOSIS — Z87891 Personal history of nicotine dependence: Secondary | ICD-10-CM

## 2014-05-22 DIAGNOSIS — H5340 Unspecified visual field defects: Secondary | ICD-10-CM | POA: Diagnosis present

## 2014-05-22 DIAGNOSIS — I12 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 stage renal disease: Secondary | ICD-10-CM | POA: Diagnosis present

## 2014-05-22 DIAGNOSIS — Z992 Dependence on renal dialysis: Secondary | ICD-10-CM

## 2014-05-22 LAB — CBC
HCT: 36.8 % — ABNORMAL LOW (ref 39.0–52.0)
Hemoglobin: 11.6 g/dL — ABNORMAL LOW (ref 13.0–17.0)
MCH: 29 pg (ref 26.0–34.0)
MCHC: 31.5 g/dL (ref 30.0–36.0)
MCV: 92 fL (ref 78.0–100.0)
Platelets: 251 10*3/uL (ref 150–400)
RBC: 4 MIL/uL — ABNORMAL LOW (ref 4.22–5.81)
RDW: 16.5 % — ABNORMAL HIGH (ref 11.5–15.5)
WBC: 9.6 10*3/uL (ref 4.0–10.5)

## 2014-05-22 LAB — DIFFERENTIAL
Basophils Absolute: 0.1 10*3/uL (ref 0.0–0.1)
Basophils Relative: 1 % (ref 0–1)
Eosinophils Absolute: 0.5 10*3/uL (ref 0.0–0.7)
Eosinophils Relative: 5 % (ref 0–5)
Lymphocytes Relative: 14 % (ref 12–46)
Lymphs Abs: 1.3 10*3/uL (ref 0.7–4.0)
Monocytes Absolute: 0.7 10*3/uL (ref 0.1–1.0)
Monocytes Relative: 8 % (ref 3–12)
Neutro Abs: 7 10*3/uL (ref 1.7–7.7)
Neutrophils Relative %: 72 % (ref 43–77)

## 2014-05-22 LAB — CBG MONITORING, ED: Glucose-Capillary: 97 mg/dL (ref 70–99)

## 2014-05-22 MED ORDER — ALTEPLASE (STROKE) FULL DOSE INFUSION
90.0000 mg | Freq: Once | INTRAVENOUS | Status: DC
  Filled 2014-05-22: qty 90

## 2014-05-22 MED ORDER — ASPIRIN 81 MG PO CHEW
324.0000 mg | CHEWABLE_TABLET | Freq: Once | ORAL | Status: AC
  Administered 2014-05-22: 324 mg via ORAL
  Filled 2014-05-22: qty 4

## 2014-05-22 MED ORDER — SODIUM CHLORIDE 0.9 % IV SOLN: 50.0000 mL | Freq: Once | INTRAVENOUS | Status: DC

## 2014-05-22 NOTE — ED Provider Notes (Signed)
CSN: [id number]     Arrival date & time 05/22/14  2202 History  This chart was scribed for Noemi Chapel, MD by Eustaquio Maize, ED Scribe. This patient was seen in room TRABC/TRABC and the patient's care was started at 11:27 PM.    Chief Complaint  Patient presents with  . Dizziness    The history is provided by the patient. No language interpreter was used.     HPI Comments: Dalynn Farag Sr. is a 54 y.o. male with hx HTN and Chronic kidney disease on dialysis MWF, who presents to the Emergency Department complaining of acute onset difficulty walking, lightheadedness, and nausea that began around 7 PM this evening. Pt states that he bumped his head earlier tonight while in his truck and that the symptoms began about an hour and a half after the incident. He states that the symptoms have been constant and severe. He denies any modifying factors. Denies vertigo, difficulty with speech, headaches, LOC, vision changes, or any other symptoms.    Past Medical History  Diagnosis Date  . Hypertension   . Anemia   . Chronic headaches   . Sleep apnea   . Chronic kidney disease     Chronic Renal Failure; On Renal Transplant List  . Presence of arteriovenous fistula for hemodialysis, primary     RUE  . Hemodialysis patient     M-W-F   Past Surgical History  Procedure Laterality Date  . Av fistula placement Right     right arm  . Graft left arm Left     for dialysis x 2  . Insertion of dialysis catheter      Rt chest   Family History  Problem Relation Age of Onset  . Diabetes Father   . Uterine cancer Mother   . Stroke Father   . Hypertension Father   . Lupus Sister   . Stroke Sister   . Anuerysm Brother     brain  . Hypertension Sister    History  Substance Use Topics  . Smoking status: Former Smoker    Types: Cigars    Quit date: 06/19/1990  . Smokeless tobacco: Not on file  . Alcohol Use: 0.0 oz/week     Comment: occasional    Review of Systems  All other systems  reviewed and are negative.     Allergies  Infed; Oxycodone; and Vicodin  Home Medications   Prior to Admission medications   Medication Sig Start Date End Date Taking? Authorizing Provider  calcium acetate (PHOSLO) 667 MG capsule Take 3,335 mg by mouth 3 (three) times daily with meals.    Yes Historical Provider, MD  cinacalcet (SENSIPAR) 60 MG tablet Take 120 mg by mouth daily.    Yes Historical Provider, MD  gabapentin (NEURONTIN) 300 MG capsule Take 300 mg by mouth daily.    Yes Historical Provider, MD  Multiple Vitamin (MULTI VITAMIN MENS PO) Take 1 tablet by mouth daily.     Yes Historical Provider, MD  multivitamin (RENA-VIT) TABS tablet Take 1 tablet by mouth daily.   Yes Historical Provider, MD  ciprofloxacin (CIPRO) 500 MG tablet Take 1 tablet (500 mg total) by mouth daily. Patient not taking: Reported on 05/23/2014 12/22/13   Elmyra Ricks Pisciotta, PA-C  metroNIDAZOLE (FLAGYL) 500 MG tablet Take 1 tablet (500 mg total) by mouth 2 (two) times daily. One tab PO bid x 10 days Patient not taking: Reported on 05/23/2014 12/22/13   Elmyra Ricks Pisciotta, PA-C  oxyCODONE-acetaminophen (PERCOCET) 5-325 MG per  tablet Take 2 tablets by mouth every 4 (four) hours as needed. Patient not taking: Reported on 05/23/2014 09/03/13   Veryl Speak, MD  promethazine (PHENERGAN) 25 MG tablet Take 1 tablet (25 mg total) by mouth every 6 (six) hours as needed for nausea or vomiting. Patient not taking: Reported on 05/23/2014 12/22/13   Elmyra Ricks Pisciotta, PA-C  traMADol (ULTRAM) 50 MG tablet Take 1 tablet (50 mg total) by mouth every 6 (six) hours as needed. Patient not taking: Reported on 05/23/2014 12/22/13   Elmyra Ricks Pisciotta, PA-C   Triage Vitals: BP 123/79 mmHg  Pulse 87  Temp(Src) 98.7 F (37.1 C) (Oral)  Resp 18  SpO2 98%   Physical Exam  Constitutional: He appears well-developed and well-nourished. No distress.  HENT:  Head: Normocephalic and atraumatic.  Mouth/Throat: Oropharynx is clear and moist.  No oropharyngeal exudate.  Eyes: Conjunctivae and EOM are normal. Pupils are equal, round, and reactive to light. Right eye exhibits no discharge. Left eye exhibits no discharge. No scleral icterus.  Neck: Normal range of motion. Neck supple. No JVD present. No thyromegaly present.  Cardiovascular: Normal rate, regular rhythm, normal heart sounds and intact distal pulses.  Exam reveals no gallop and no friction rub.   No murmur heard. Pulmonary/Chest: Effort normal and breath sounds normal. No respiratory distress. He has no wheezes. He has no rales.  Abdominal: Soft. Bowel sounds are normal. He exhibits no distension and no mass. There is no tenderness.  Musculoskeletal: Normal range of motion. He exhibits no edema or tenderness.  Lymphadenopathy:    He has no cervical adenopathy.  Neurological: He is alert. Coordination normal.  The patient has normal strength in all 4 extremities, his speech is clear, he has left-sided neglect, difficulty with finger-nose-finger on the left, decreased sensation to the left side, cranial nerves III through XII otherwise normal, he does have nystagmus at rest. The patient was not ambulated.  Skin: Skin is warm and dry. No rash noted. No erythema.  Fistula in right forearm with good thrill. No redness or induration.   Psychiatric: He has a normal mood and affect. His behavior is normal.  Nursing note and vitals reviewed.   ED Course  Procedures (including critical care time)  DIAGNOSTIC STUDIES: Oxygen Saturation is 98% on RA, normal by my interpretation.    COORDINATION OF CARE: 11:38 PM-Discussed treatment plan which includes CT Head, CBG, INR, CBC, CMP, Troponin with pt at bedside and pt agreed to plan.   Labs Review Labs Reviewed  CBC - Abnormal; Notable for the following:    RBC 4.00 (*)    Hemoglobin 11.6 (*)    HCT 36.8 (*)    RDW 16.5 (*)    All other components within normal limits  COMPREHENSIVE METABOLIC PANEL - Abnormal; Notable for the  following:    Chloride 95 (*)    BUN 49 (*)    Creatinine, Ser 10.71 (*)    Albumin 3.1 (*)    GFR calc non Af Amer 5 (*)    GFR calc Af Amer 5 (*)    Anion gap 16 (*)    All other components within normal limits  PROTIME-INR  APTT  DIFFERENTIAL  CBG MONITORING, ED  I-STAT TROPOININ, ED    Imaging Review Ct Head (brain) Wo Contrast  05/22/2014   CLINICAL DATA:  Code stroke. Dizziness beginning at 2030 hours. Left-sided weakness. No peripheral vision in the left eye.  EXAM: CT HEAD WITHOUT CONTRAST  TECHNIQUE: Contiguous axial images were  obtained from the base of the skull through the vertex without intravenous contrast.  COMPARISON:  MRI brain 12/20/2011. CT angio head 12/18/2011. CT head 05/17/2011.  FINDINGS: There is extensive dural thickening and calcification along the falx and tentorium bilaterally. This is unchanged since prior study. Ventricles and sulci appear symmetrical. No mass effect or midline shift. No abnormal extra-axial fluid collections. Focal low-attenuation in the left caudate lobe probably represents old lacunar infarct. No ventricular dilatation. Gray-white matter junctions are distinct. Basal cisterns are not effaced. No acute intracranial hemorrhage. Mucosal thickening in the paranasal sinuses. Mastoid air cells are not opacified. Vascular calcifications. Calvarium appears intact. No significant change since prior study.  IMPRESSION: Prominent dural calcification along the falx and tentorium again demonstrated. 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 No acute intracranial abnormalities. No significant change since previous study.  These results were called by telephone at the time of interpretation on 05/22/2014 at 11:26 pm to Dr. Janann Colonel, who verbally acknowledged these results.   Electronically Signed   By: Lucienne Capers M.D.   On: 05/22/2014 23:32     EKG Interpretation   Date/Time:  Saturday May 22 2014 23:24:58 EDT Ventricular Rate:  83 PR Interval:   186 QRS Duration: 102 QT Interval:  386 QTC Calculation: 453 R Axis:   35 Text Interpretation:  Sinus rhythm since last tracing no significant  change Confirmed by Sabra Heck  MD, Camran Keady ([id number]) on 05/22/2014 11:40:32 PM      MDM   Final diagnoses:  Stroke    Code stroke activated when the patient arrived in triage, his vital signs do not show any kind of significant hypertension, CT scan was negative, EKG  was unremarkable, neurology saw the patient immediately and because the patient has a difficult time lying to trace to his exact start time it was thought that her not to give thrombolytic therapy due to risks outweighing benefits. The patient is in agreement, he will be admitted to medical service, lab workup pending.   Discussed with neurology, they will defer thrombolytics at this time, discussed with hospitalist Dr. Arnoldo Morale who will admit.  I personally performed the services described in this documentation, which was scribed in my presence. The recorded information has been reviewed and is accurate.       Noemi Chapel, MD 05/23/14 0230

## 2014-05-22 NOTE — ED Notes (Signed)
MD at bedside. 

## 2014-05-22 NOTE — ED Notes (Signed)
Pt reports dizziness that began today; pt reports hitting head today when getting in the truck; pt reports dizziness began about an hour and a half post head injury; pt denies LOC or changes in vision; pt reports "feeling drunk" and having an unsteady gait; pt is MWF dialysis; pt denies n/v

## 2014-05-22 NOTE — ED Notes (Signed)
Code Stroke activated @ 2309

## 2014-05-23 ENCOUNTER — Observation Stay (HOSPITAL_COMMUNITY): Payer: Medicare Other

## 2014-05-23 DIAGNOSIS — I63312 Cerebral infarction due to thrombosis of left middle cerebral artery: Secondary | ICD-10-CM | POA: Diagnosis not present

## 2014-05-23 DIAGNOSIS — D631 Anemia in chronic kidney disease: Secondary | ICD-10-CM | POA: Diagnosis present

## 2014-05-23 DIAGNOSIS — R260 Ataxic gait: Secondary | ICD-10-CM | POA: Diagnosis present

## 2014-05-23 DIAGNOSIS — R42 Dizziness and giddiness: Secondary | ICD-10-CM | POA: Diagnosis not present

## 2014-05-23 DIAGNOSIS — I639 Cerebral infarction, unspecified: Secondary | ICD-10-CM | POA: Diagnosis present

## 2014-05-23 DIAGNOSIS — E785 Hyperlipidemia, unspecified: Secondary | ICD-10-CM | POA: Diagnosis present

## 2014-05-23 DIAGNOSIS — D638 Anemia in other chronic diseases classified elsewhere: Secondary | ICD-10-CM | POA: Diagnosis present

## 2014-05-23 DIAGNOSIS — I10 Essential (primary) hypertension: Secondary | ICD-10-CM | POA: Diagnosis not present

## 2014-05-23 DIAGNOSIS — Z79899 Other long term (current) drug therapy: Secondary | ICD-10-CM | POA: Diagnosis not present

## 2014-05-23 DIAGNOSIS — Z992 Dependence on renal dialysis: Secondary | ICD-10-CM | POA: Diagnosis not present

## 2014-05-23 DIAGNOSIS — G939 Disorder of brain, unspecified: Secondary | ICD-10-CM | POA: Diagnosis not present

## 2014-05-23 DIAGNOSIS — G629 Polyneuropathy, unspecified: Secondary | ICD-10-CM | POA: Diagnosis present

## 2014-05-23 DIAGNOSIS — N2581 Secondary hyperparathyroidism of renal origin: Secondary | ICD-10-CM | POA: Diagnosis present

## 2014-05-23 DIAGNOSIS — G8194 Hemiplegia, unspecified affecting left nondominant side: Secondary | ICD-10-CM | POA: Diagnosis present

## 2014-05-23 DIAGNOSIS — I6521 Occlusion and stenosis of right carotid artery: Secondary | ICD-10-CM | POA: Diagnosis not present

## 2014-05-23 DIAGNOSIS — N186 End stage renal disease: Secondary | ICD-10-CM | POA: Diagnosis not present

## 2014-05-23 DIAGNOSIS — D649 Anemia, unspecified: Secondary | ICD-10-CM | POA: Diagnosis present

## 2014-05-23 DIAGNOSIS — Z8673 Personal history of transient ischemic attack (TIA), and cerebral infarction without residual deficits: Secondary | ICD-10-CM | POA: Insufficient documentation

## 2014-05-23 DIAGNOSIS — N189 Chronic kidney disease, unspecified: Secondary | ICD-10-CM

## 2014-05-23 DIAGNOSIS — G4730 Sleep apnea, unspecified: Secondary | ICD-10-CM | POA: Diagnosis present

## 2014-05-23 DIAGNOSIS — H5340 Unspecified visual field defects: Secondary | ICD-10-CM | POA: Diagnosis present

## 2014-05-23 DIAGNOSIS — K219 Gastro-esophageal reflux disease without esophagitis: Secondary | ICD-10-CM

## 2014-05-23 DIAGNOSIS — I12 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 stage renal disease: Secondary | ICD-10-CM | POA: Diagnosis not present

## 2014-05-23 DIAGNOSIS — Q7649 Other congenital malformations of spine, not associated with scoliosis: Secondary | ICD-10-CM | POA: Diagnosis not present

## 2014-05-23 DIAGNOSIS — I6789 Other cerebrovascular disease: Secondary | ICD-10-CM | POA: Diagnosis not present

## 2014-05-23 DIAGNOSIS — Z87891 Personal history of nicotine dependence: Secondary | ICD-10-CM | POA: Diagnosis not present

## 2014-05-23 DIAGNOSIS — I6359 Cerebral infarction due to unspecified occlusion or stenosis of other cerebral artery: Secondary | ICD-10-CM | POA: Diagnosis not present

## 2014-05-23 LAB — COMPREHENSIVE METABOLIC PANEL
ALT: 13 U/L (ref 0–53)
AST: 20 U/L (ref 0–37)
Albumin: 3.1 g/dL — ABNORMAL LOW (ref 3.5–5.2)
Alkaline Phosphatase: 76 U/L (ref 39–117)
Anion gap: 16 — ABNORMAL HIGH (ref 5–15)
BUN: 49 mg/dL — ABNORMAL HIGH (ref 6–23)
CO2: 26 mmol/L (ref 19–32)
Calcium: 8.7 mg/dL (ref 8.4–10.5)
Chloride: 95 mmol/L — ABNORMAL LOW (ref 96–112)
Creatinine, Ser: 10.71 mg/dL — ABNORMAL HIGH (ref 0.50–1.35)
GFR calc Af Amer: 5 mL/min — ABNORMAL LOW (ref >90)
GFR calc non Af Amer: 5 mL/min — ABNORMAL LOW (ref >90)
Glucose, Bld: 86 mg/dL (ref 70–99)
Potassium: 4.6 mmol/L (ref 3.5–5.1)
Sodium: 137 mmol/L (ref 135–145)
Total Bilirubin: 0.5 mg/dL (ref 0.3–1.2)
Total Protein: 8 g/dL (ref 6.0–8.3)

## 2014-05-23 LAB — LIPID PANEL
Cholesterol: 154 mg/dL (ref 0–200)
HDL: 53 mg/dL (ref >39)
LDL Cholesterol: 91 mg/dL (ref 0–99)
Total CHOL/HDL Ratio: 2.9 RATIO
Triglycerides: 50 mg/dL (ref <150)
VLDL: 10 mg/dL (ref 0–40)

## 2014-05-23 LAB — PROTIME-INR
INR: 1.03 (ref 0.00–1.49)
Prothrombin Time: 13.6 s (ref 11.6–15.2)

## 2014-05-23 LAB — APTT: aPTT: 28 seconds (ref 24–37)

## 2014-05-23 LAB — I-STAT TROPONIN, ED: Troponin i, poc: 0 ng/mL (ref 0.00–0.08)

## 2014-05-23 MED ORDER — SENNOSIDES-DOCUSATE SODIUM 8.6-50 MG PO TABS: 1.0000 | ORAL_TABLET | Freq: Every evening | ORAL | Status: DC | PRN

## 2014-05-23 MED ORDER — ASPIRIN 325 MG PO TABS
325.0000 mg | ORAL_TABLET | Freq: Every day | ORAL | Status: DC
  Administered 2014-05-23 – 2014-05-25 (×3): 325 mg via ORAL
  Filled 2014-05-23 (×3): qty 1

## 2014-05-23 MED ORDER — ASPIRIN 300 MG RE SUPP
300.0000 mg | Freq: Every day | RECTAL | Status: DC
Start: 2014-05-23 — End: 2014-05-24
  Filled 2014-05-23: qty 1

## 2014-05-23 MED ORDER — CALCIUM CARBONATE ANTACID 500 MG PO CHEW
1.0000 | CHEWABLE_TABLET | Freq: Two times a day (BID) | ORAL | Status: DC | PRN
  Administered 2014-05-23: 200 mg via ORAL
  Filled 2014-05-23: qty 1

## 2014-05-23 MED ORDER — ATORVASTATIN CALCIUM 10 MG PO TABS
10.0000 mg | ORAL_TABLET | Freq: Every day | ORAL | Status: DC
  Administered 2014-05-23 – 2014-05-24 (×2): 10 mg via ORAL
  Filled 2014-05-23 (×2): qty 1

## 2014-05-23 MED ORDER — RENA-VITE PO TABS
1.0000 | ORAL_TABLET | Freq: Every day | ORAL | Status: DC
  Administered 2014-05-23 – 2014-05-25 (×3): 1 via ORAL
  Filled 2014-05-23 (×3): qty 1

## 2014-05-23 MED ORDER — CALCIUM ACETATE (PHOS BINDER) 667 MG PO CAPS
3335.0000 mg | ORAL_CAPSULE | Freq: Three times a day (TID) | ORAL | Status: DC
  Administered 2014-05-23 – 2014-05-25 (×7): 3335 mg via ORAL
  Filled 2014-05-23 (×11): qty 5

## 2014-05-23 MED ORDER — ONDANSETRON HCL 4 MG/2ML IJ SOLN: 4.0000 mg | Freq: Three times a day (TID) | INTRAMUSCULAR | Status: AC | PRN

## 2014-05-23 MED ORDER — ACETAMINOPHEN 500 MG PO TABS
500.0000 mg | ORAL_TABLET | Freq: Four times a day (QID) | ORAL | Status: DC | PRN
  Administered 2014-05-23 – 2014-05-24 (×3): 500 mg via ORAL
  Filled 2014-05-23 (×3): qty 1

## 2014-05-23 MED ORDER — CINACALCET HCL 30 MG PO TABS
120.0000 mg | ORAL_TABLET | Freq: Every day | ORAL | Status: DC
  Administered 2014-05-23 – 2014-05-25 (×3): 120 mg via ORAL
  Filled 2014-05-23 (×4): qty 4

## 2014-05-23 MED ORDER — GABAPENTIN 300 MG PO CAPS
300.0000 mg | ORAL_CAPSULE | Freq: Every day | ORAL | Status: DC
  Administered 2014-05-23 – 2014-05-25 (×3): 300 mg via ORAL
  Filled 2014-05-23 (×3): qty 1

## 2014-05-23 MED ORDER — HEPARIN SODIUM (PORCINE) 5000 UNIT/ML IJ SOLN
5000.0000 [IU] | Freq: Three times a day (TID) | INTRAMUSCULAR | Status: DC
  Administered 2014-05-23 – 2014-05-25 (×7): 5000 [IU] via SUBCUTANEOUS
  Filled 2014-05-23 (×6): qty 1

## 2014-05-23 MED ORDER — STROKE: EARLY STAGES OF RECOVERY BOOK
Freq: Once | Status: AC
  Administered 2014-05-23: 04:00:00

## 2014-05-23 MED ORDER — HYDROMORPHONE HCL 1 MG/ML IJ SOLN: 0.5000 mg | INTRAMUSCULAR | Status: DC | PRN

## 2014-05-23 NOTE — Progress Notes (Signed)
Patient Demographics  [name], is a 54 y.o. male, DOB - [birth year], [id number]  Admit date - 05/22/2014   Admitting Physician Theressa Millard, MD  Outpatient Primary MD for the patient is Lucrezia Starch, MD  LOS - 0   Chief Complaint  Patient presents with  . Dizziness        Subjective:   [name] today has, No headache, No chest pain, No abdominal pain - No Nausea, No new weakness tingling or numbness, No Cough - SOB.    Assessment & Plan    1. ? Small R. parieto-occipital lobe infarct. Also had head injury prior to the episode, currently on aspirin and will add statin as LDL above 70, full stroke workup underway, monitor on tele, obtain PT, OT, speech input, A1c , check echogram, carotid duplex, neurology following.   No results found for: HGBA1C  Lab Results  Component Value Date   CHOL 160 12/09/2012   HDL 67 12/09/2012   LDLCALC 85 12/09/2012   TRIG 41 12/09/2012   CHOLHDL 2.4 12/09/2012     2. ESRD. Monday, Wednesday and Friday dialysis schedule. Renal consulted.   3. Peripheral neuropathy. Continue Neurontin.   4.AOCD - stable.    Code Status: full  Family Communication: none  Disposition Plan: home   Procedures MRI-A Brain, TTE, Carotids  Consults Neuro, renal   Medications  Scheduled Meds: . alteplase  90 mg Intravenous Once   Followed by  . sodium chloride  50 mL Intravenous Once  . aspirin  300 mg Rectal Daily   Or  . aspirin  325 mg Oral Daily  . calcium acetate  3,335 mg Oral TID WC  . cinacalcet  120 mg Oral Q breakfast  . gabapentin  300 mg Oral Daily  . heparin  5,000 Units Subcutaneous 3 times per day  . multivitamin  1 tablet Oral Daily   Continuous Infusions:  PRN Meds:.calcium carbonate, HYDROmorphone (DILAUDID)  injection, ondansetron (ZOFRAN) IV, senna-docusate  DVT Prophylaxis    Heparin    Lab Results  Component Value Date   PLT 251 05/22/2014    Antibiotics     Anti-infectives    None          Objective:   Filed Vitals:   05/23/14 0333 05/23/14 0600 05/23/14 0800 05/23/14 1000  BP: 126/90 126/85 103/64 130/82  Pulse: 76 74 64 77  Temp: 98.1 F (36.7 C) 98.1 F (36.7 C) 98.2 F (36.8 C) 97.9 F (36.6 C)  TempSrc: Oral Oral Oral Oral  Resp: 20 20 14 16   SpO2: 100% 100% 97% 100%    Wt Readings from Last 3 Encounters:  12/15/12 115.894 kg (255 lb 8 oz)  12/09/12 120.112 kg (264 lb 12.8 oz)  11/12/12 112.492 kg (248 lb)    No intake or output data in the 24 hours ending 05/23/14 1039   Physical Exam  Awake Alert, Oriented X 3, No new F.N deficits, Normal affect [place].AT,PERRAL Supple Neck,No JVD, No cervical lymphadenopathy appriciated.  Symmetrical Chest wall movement, Good air movement bilaterally, CTAB RRR,No Gallops,Rubs or new Murmurs, No Parasternal Heave +ve B.Sounds, Abd Soft, No tenderness, No organomegaly appriciated, No rebound - guarding or rigidity. No Cyanosis, Clubbing or edema, No new  Rash or bruise      Data Review   Micro Results No results found for this or any previous visit (from the past 240 hour(s)).  Radiology Reports Ct Head (brain) Wo Contrast  05/22/2014   CLINICAL DATA:  Code stroke. Dizziness beginning at 2030 hours. Left-sided weakness. No peripheral vision in the left eye.  EXAM: CT HEAD WITHOUT CONTRAST  TECHNIQUE: Contiguous axial images were obtained from the base of the skull through the vertex without intravenous contrast.  COMPARISON:  MRI brain 12/20/2011. CT angio head 12/18/2011. CT head 05/17/2011.  FINDINGS: There is extensive dural thickening and calcification along the falx and tentorium bilaterally. This is unchanged since prior study. Ventricles and sulci appear symmetrical. No mass effect or midline shift. No abnormal  extra-axial fluid collections. Focal low-attenuation in the left caudate lobe probably represents old lacunar infarct. No ventricular dilatation. Gray-white matter junctions are distinct. Basal cisterns are not effaced. No acute intracranial hemorrhage. Mucosal thickening in the paranasal sinuses. Mastoid air cells are not opacified. Vascular calcifications. Calvarium appears intact. No significant change since prior study.  IMPRESSION: Prominent dural calcification along the falx and tentorium again demonstrated. 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 No acute intracranial abnormalities. No significant change since previous study.  These results were called by telephone at the time of interpretation on 05/22/2014 at 11:26 pm to Dr. Janann Colonel, who verbally acknowledged these results.   Electronically Signed   By: Lucienne Capers M.D.   On: 05/22/2014 23:32   Mr Brain Wo Contrast  05/23/2014   CLINICAL DATA:  54 year old hypertensive male with end-stage renal disease on dialysis presenting with sudden onset of dizziness, ataxia and nausea. Left-sided weakness with peripheral visual loss left eye. Subsequent encounter.  EXAM: MRI HEAD WITHOUT CONTRAST  MRA HEAD WITHOUT CONTRAST  TECHNIQUE: Multiplanar, multiecho pulse sequences of the brain and surrounding structures were obtained without intravenous contrast. Angiographic images of the head were obtained using MRA technique without contrast.  COMPARISON:  No comparison exams available for direct review at the current time.  FINDINGS: MRI HEAD FINDINGS  This exam was interpreted during a PACS downtime with limited availability of comparison cases. It has been flagged for review following the downtime. If clinically indicated after this review, an addendum will be issued providing details about comparison to prior imaging.  Diffuse dural calcifications/dural thickening.  Parietal-occipital FLAIR abnormality greater on the right suspicious for posterior reversible  encephalopathy syndrome. Other potential causes for this appearance include that secondary to; artifact, meningitis, subarachnoid hemorrhage or result of external supplemental oxygen.  Adequate flow within the superior sagittal sinus cannot be confirmed. This appearance may be chronic and related to the adjacent dural thickening however, sagittal sinus thrombosis could contribute to the parietal occipital FLAIR abnormality.  Suggestion of tiny acute posterior left corona radiata infarct.  Subtle restricted motion medial aspect of the right parietal -occipital lobe may represent result of posterior reversible encephalopathy syndrome or small infarct.  Subtle diffusion abnormality within the mid to lower pons may reflect artifact rather than acute infarct.  Abnormal vessel extends from the left internal cerebral vein level along the left ambient cistern to the superior margin of the left internal auditory canal. Question small dural fistula.  Opacification left petrous apex.  Abnormal bone marrow may represent anemia of renal disease.  Partially empty slightly expanded sella without other findings of pseudotumor cerebri. Mild exophthalmos.  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  Polypoid opacification inferior aspect left maxillary sinus. Minimal mucosal thickening ethmoid  sinus air cells.  Left parotid 1.4 x 1 centimeter nonspecific lesion incompletely assessed on present exam.  MRA HEAD FINDINGS  Exam is motion degraded.  Mild narrowing and irregularity right internal carotid artery cavernous segment without high-grade stenosis.  Prominent left posterior communicating artery origin infundibulum without discrete aneurysm.  Hypoplastic A1 segment right anterior cerebral artery. Azygous configuration A2 segment anterior cerebral artery.  Middle cerebral artery mild to moderate branch vessel narrowing and irregularity.  Left vertebral artery is dominant. Non visualized left posterior inferior cerebellar artery.  Mild narrowing  distal right vertebral artery.  Nonvisualized right anterior inferior cerebellar artery.  No significant stenosis basilar artery.  Posterior cerebral artery branch vessel irregularity.  Irregularity narrowing superior cerebellar artery more notable on the right.  IMPRESSION: MRI HEAD  This exam was interpreted during a PACS downtime with limited availability of comparison cases. It has been flagged for review following the downtime. If clinically indicated after this review, an addendum will be issued providing details about comparison to prior imaging.  Diffuse dural calcifications/dural thickening.  Parietal-occipital FLAIR abnormality greater on the right suspicious for posterior reversible encephalopathy syndrome. Other potential causes for this appearance include that secondary to; artifact, meningitis, subarachnoid hemorrhage or result of external supplemental oxygen.  Adequate flow within the superior sagittal sinus cannot be confirmed. This appearance may be chronic and related to the adjacent dural thickening however, sagittal sinus thrombosis could contribute to the parietal occipital FLAIR abnormality.  Suggestion of tiny acute posterior left corona radiata infarct.  Subtle restricted motion medial aspect of the right parietal -occipital lobe may represent result of posterior reversible encephalopathy syndrome or small infarct.  Abnormal vessel extends from the left internal cerebral vein level along the left ambient cistern to the superior margin of the left internal auditory canal. Question small dural fistula.  Opacification left petrous apex.  Abnormal bone marrow may represent anemia of renal disease.  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  Left parotid 1.4 x 1 centimeter nonspecific lesion incompletely assessed on present exam.  MRA HEAD  Exam is motion degraded.  Mild narrowing and irregularity right internal carotid artery cavernous segment without high-grade stenosis.  Prominent left posterior communicating  artery origin infundibulum without discrete aneurysm.  Hypoplastic A1 segment right anterior cerebral artery. Azygous configuration A2 segment anterior cerebral artery.  Middle cerebral artery mild to moderate branch vessel narrowing and irregularity.  Non visualized left posterior inferior cerebellar artery.  Mild narrowing distal right vertebral artery.  Nonvisualized right anterior inferior cerebellar artery.  Posterior cerebral artery branch vessel irregularity.  Irregularity and narrowing superior cerebellar artery more notable on the right.   Electronically Signed   By: Genia Del M.D.   On: 05/23/2014 08:34   Mr Jodene Nam Head/brain Wo Cm  05/23/2014   CLINICAL DATA:  54 year old hypertensive male with end-stage renal disease on dialysis presenting with sudden onset of dizziness, ataxia and nausea. Left-sided weakness with peripheral visual loss left eye. Subsequent encounter.  EXAM: MRI HEAD WITHOUT CONTRAST  MRA HEAD WITHOUT CONTRAST  TECHNIQUE: Multiplanar, multiecho pulse sequences of the brain and surrounding structures were obtained without intravenous contrast. Angiographic images of the head were obtained using MRA technique without contrast.  COMPARISON:  No comparison exams available for direct review at the current time.  FINDINGS: MRI HEAD FINDINGS  This exam was interpreted during a PACS downtime with limited availability of comparison cases. It has been flagged for review following the downtime. If clinically indicated after this review, an addendum will be issued  providing details about comparison to prior imaging.  Diffuse dural calcifications/dural thickening.  Parietal-occipital FLAIR abnormality greater on the right suspicious for posterior reversible encephalopathy syndrome. Other potential causes for this appearance include that secondary to; artifact, meningitis, subarachnoid hemorrhage or result of external supplemental oxygen.  Adequate flow within the superior sagittal sinus cannot be  confirmed. This appearance may be chronic and related to the adjacent dural thickening however, sagittal sinus thrombosis could contribute to the parietal occipital FLAIR abnormality.  Suggestion of tiny acute posterior left corona radiata infarct.  Subtle restricted motion medial aspect of the right parietal -occipital lobe may represent result of posterior reversible encephalopathy syndrome or small infarct.  Subtle diffusion abnormality within the mid to lower pons may reflect artifact rather than acute infarct.  Abnormal vessel extends from the left internal cerebral vein level along the left ambient cistern to the superior margin of the left internal auditory canal. Question small dural fistula.  Opacification left petrous apex.  Abnormal bone marrow may represent anemia of renal disease.  Partially empty slightly expanded sella without other findings of pseudotumor cerebri. Mild exophthalmos.  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  Polypoid opacification inferior aspect left maxillary sinus. Minimal mucosal thickening ethmoid sinus air cells.  Left parotid 1.4 x 1 centimeter nonspecific lesion incompletely assessed on present exam.  MRA HEAD FINDINGS  Exam is motion degraded.  Mild narrowing and irregularity right internal carotid artery cavernous segment without high-grade stenosis.  Prominent left posterior communicating artery origin infundibulum without discrete aneurysm.  Hypoplastic A1 segment right anterior cerebral artery. Azygous configuration A2 segment anterior cerebral artery.  Middle cerebral artery mild to moderate branch vessel narrowing and irregularity.  Left vertebral artery is dominant. Non visualized left posterior inferior cerebellar artery.  Mild narrowing distal right vertebral artery.  Nonvisualized right anterior inferior cerebellar artery.  No significant stenosis basilar artery.  Posterior cerebral artery branch vessel irregularity.  Irregularity narrowing superior cerebellar artery more notable  on the right.  IMPRESSION: MRI HEAD  This exam was interpreted during a PACS downtime with limited availability of comparison cases. It has been flagged for review following the downtime. If clinically indicated after this review, an addendum will be issued providing details about comparison to prior imaging.  Diffuse dural calcifications/dural thickening.  Parietal-occipital FLAIR abnormality greater on the right suspicious for posterior reversible encephalopathy syndrome. Other potential causes for this appearance include that secondary to; artifact, meningitis, subarachnoid hemorrhage or result of external supplemental oxygen.  Adequate flow within the superior sagittal sinus cannot be confirmed. This appearance may be chronic and related to the adjacent dural thickening however, sagittal sinus thrombosis could contribute to the parietal occipital FLAIR abnormality.  Suggestion of tiny acute posterior left corona radiata infarct.  Subtle restricted motion medial aspect of the right parietal -occipital lobe may represent result of posterior reversible encephalopathy syndrome or small infarct.  Abnormal vessel extends from the left internal cerebral vein level along the left ambient cistern to the superior margin of the left internal auditory canal. Question small dural fistula.  Opacification left petrous apex.  Abnormal bone marrow may represent anemia of renal disease.  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  Left parotid 1.4 x 1 centimeter nonspecific lesion incompletely assessed on present exam.  MRA HEAD  Exam is motion degraded.  Mild narrowing and irregularity right internal carotid artery cavernous segment without high-grade stenosis.  Prominent left posterior communicating artery origin infundibulum without discrete aneurysm.  Hypoplastic A1 segment right anterior cerebral artery. Azygous configuration A2 segment anterior cerebral  artery.  Middle cerebral artery mild to moderate branch vessel narrowing and  irregularity.  Non visualized left posterior inferior cerebellar artery.  Mild narrowing distal right vertebral artery.  Nonvisualized right anterior inferior cerebellar artery.  Posterior cerebral artery branch vessel irregularity.  Irregularity and narrowing superior cerebellar artery more notable on the right.   Electronically Signed   By: Genia Del M.D.   On: 05/23/2014 08:34     CBC  Recent Labs Lab 05/22/14 2335  WBC 9.6  HGB 11.6*  HCT 36.8*  PLT 251  MCV 92.0  MCH 29.0  MCHC 31.5  RDW 16.5*  LYMPHSABS 1.3  MONOABS 0.7  EOSABS 0.5  BASOSABS 0.1    Chemistries   Recent Labs Lab 05/22/14 2335  NA 137  K 4.6  CL 95*  CO2 26  GLUCOSE 86  BUN 49*  CREATININE 10.71*  CALCIUM 8.7  AST 20  ALT 13  ALKPHOS 76  BILITOT 0.5   ------------------------------------------------------------------------------------------------------------------ CrCl cannot be calculated (Unknown ideal weight.). ------------------------------------------------------------------------------------------------------------------ No results for input(s): HGBA1C in the last 72 hours. ------------------------------------------------------------------------------------------------------------------ No results for input(s): CHOL, HDL, LDLCALC, TRIG, CHOLHDL, LDLDIRECT in the last 72 hours. ------------------------------------------------------------------------------------------------------------------ No results for input(s): TSH, T4TOTAL, T3FREE, THYROIDAB in the last 72 hours.  Invalid input(s): FREET3 ------------------------------------------------------------------------------------------------------------------ No results for input(s): VITAMINB12, FOLATE, FERRITIN, TIBC, IRON, RETICCTPCT in the last 72 hours.  Coagulation profile  Recent Labs Lab 05/22/14 2335  INR 1.03    No results for input(s): DDIMER in the last 72 hours.  Cardiac Enzymes No results for input(s): CKMB,  TROPONINI, MYOGLOBIN in the last 168 hours.  Invalid input(s): CK ------------------------------------------------------------------------------------------------------------------ Invalid input(s): POCBNP     Time Spent in minutes  35   SINGH,PRASHANT K M.D on 05/23/2014 at 10:39 AM  Between 7am to 7pm - Pager - [phone]  After 7pm go to [email or web address] - password [facility]  [facility]   Office  [phone]

## 2014-05-23 NOTE — Evaluation (Signed)
Speech Language Pathology Evaluation Patient Details Name: Carl Dziuba Sr. MRN: [id number] DOB: 12-26-1960 Today's Date: 05/23/2014 Time: [id number] SLP Time Calculation (min) (ACUTE ONLY): 30 min  Problem List:  Patient Active Problem List   Diagnosis Date Noted  . Stroke 05/23/2014  . CVA (cerebral infarction) 05/23/2014  . Anemia of renal disease 05/23/2014  . HTN (hypertension) 12/09/2012  . GERD (gastroesophageal reflux disease) 12/09/2012  . ESRD on dialysis 12/09/2012  . Gastrointestinal bleeding 11/12/2012   Past Medical History:  Past Medical History  Diagnosis Date  . Hypertension   . Anemia   . Chronic headaches   . Sleep apnea   . Chronic kidney disease     Chronic Renal Failure; On Renal Transplant List  . Presence of arteriovenous fistula for hemodialysis, primary     RUE  . Hemodialysis patient     M-W-F   Past Surgical History:  Past Surgical History  Procedure Laterality Date  . Av fistula placement Right     right arm  . Graft left arm Left     for dialysis x 2  . Insertion of dialysis catheter      Rt chest   HPI:  Carl Basch Sr. is a 54 y.o. male with of history ESRD on HD ( MWF), HTN, Anemia who presents to the ED on 05/23/14 with complaints of sudden onset of dizziness and ataxia with nausea around 7 PM. He also had left sided weakness and loss of his peripheral vision in his left eye. He reports that he bumped the top of his head 2 hours before his symptoms began. His symptoms persisted so he called EMS, and in the ED he was evaluated as a Code Stroke, and he was not deemed a candidate for TPA since it was unclear of his last known normal time and onset of symptoms.; MRI 05/23/14 indicated Suggestion of tiny acute posterior left corona radiata infarct   Assessment / Plan / Recommendation Clinical Impression   Patient exhibited expressive aphasia at sentence to conversation level without awareness of errors; confrontational divergent and  convergent tasks 50-75% accurate overall, able to follow complex directives with repetition; repetitive expressive tasks decreased to 50% accuracy with phrase to sentence production; left neglect indicated with basic reading tasks, but improved with placement of reading materials at midline.  Patient exhibited executive functioning deficits with organization/self-correction during complex linguistic tasks; he also had difficulty with divided attention tasks during SLE    SLP Assessment  Patient needs continued Speech Language Pathology Services    Follow Up Recommendations  Home health SLP    Frequency and Duration min 2x/week  1 week   Pertinent Vitals/Pain Pain Assessment: No/denies pain   SLP Goals  Patient/Family Stated Goal: go home Potential to Achieve Goals (ACUTE ONLY): Good  SLP Evaluation Prior Functioning  Cognitive/Linguistic Baseline: Within functional limits Type of Home: House  Lives With: Son Available Help at Discharge: Family;Friend(s);Available PRN/intermittently Education: High school Vocation: Retired   Associate Professor  Overall Cognitive Status: Impaired/Different from baseline Arousal/Alertness: Awake/alert Orientation Level: Oriented X4 Attention: Divided Divided Attention: Impaired Divided Attention Impairment: Functional complex;Verbal complex Memory: Appears intact Awareness: Appears intact Problem Solving: Appears intact Executive Function: Organizing;Self Correcting Organizing: Impaired Organizing Impairment: Verbal complex Self Correcting: Impaired Self Correcting Impairment: Verbal complex Safety/Judgment: Appears intact    Comprehension  Auditory Comprehension Overall Auditory Comprehension: Appears within functional limits for tasks assessed Yes/No Questions: Within Functional Limits Commands: Within Functional Limits Conversation: Complex Interfering Components: Visual impairments Visual Recognition/Discrimination  Discrimination: Not  tested Reading Comprehension Reading Status: Impaired Word level: Within functional limits Sentence Level: Impaired Paragraph Level: Not tested Functional Environmental (signs, name badge): Within functional limits Interfering Components: Left neglect/inattention Effective Techniques: Visual cueing;Other (comment) (placement of reading material)    Expression Expression Primary Mode of Expression: Verbal Verbal Expression Overall Verbal Expression: Impaired Initiation: No impairment Level of Generative/Spontaneous Verbalization: Conversation Repetition: Impaired Level of Impairment: Phrase level Naming: Impairment Responsive: 51-75% accurate Confrontation: Within functional limits Convergent: 25-49% accurate Divergent: 25-49% accurate Verbal Errors: Perseveration;Not aware of errors;Phonemic paraphasias Pragmatics: No impairment Non-Verbal Means of Communication: Not applicable Written Expression Written Expression: Not tested   Oral / Motor Oral Motor/Sensory Function Overall Oral Motor/Sensory Function: Appears within functional limits for tasks assessed Motor Speech Overall Motor Speech: Appears within functional limits for tasks assessed Respiration: Within functional limits Phonation: Normal Resonance: Within functional limits Articulation: Within functional limitis Intelligibility: Intelligible Motor Planning: Witnin functional limits Motor Speech Errors: Not applicable        ADAMS,PAT, M.S., CCC-SLP 05/23/2014, 10:28 AM

## 2014-05-23 NOTE — Progress Notes (Signed)
Pt arrived to floor in room 4N21 via stretcher from ED. Tele placed. Vitals stable. Oriented to room and equipment. Call bell within reach. MRI called and said they will be coming to get pt shortly. Will continue to monitor.

## 2014-05-23 NOTE — Consult Note (Signed)
Stroke Consult    Chief Complaint: dizziness HPI: Carl Braswell Sr. is an 54 y.o. male hx of HTN, ESRD on HD presenting for acute onset of dizziness. Patient states he hit his head getting into his car around 1800 this evening, "shortly later" started noticing dizziness and nausea. Unsure on time of exact onset. Symptoms persist so he called EMS. Also notes some weakness on his left side. Denies any vertigo, no visual changes.   Date last known well: 05/22/14 Time last known well: 1800 tPA Given: no, outside IV tPA window  Past Medical History  Diagnosis Date  . Hypertension   . Anemia   . Chronic headaches   . Sleep apnea   . Chronic kidney disease     Chronic Renal Failure; On Renal Transplant List  . Presence of arteriovenous fistula for hemodialysis, primary     RUE  . Hemodialysis patient     M-W-F    Past Surgical History  Procedure Laterality Date  . Av fistula placement Right     right arm  . Graft left arm Left     for dialysis x 2  . Insertion of dialysis catheter      Rt chest    Family History  Problem Relation Age of Onset  . Diabetes Father   . Uterine cancer Mother   . Stroke Father   . Hypertension Father   . Lupus Sister   . Stroke Sister   . Anuerysm Brother     brain  . Hypertension Sister    Social History:  reports that he quit smoking about 23 years ago. His smoking use included Cigars. He does not have any smokeless tobacco history on file. He reports that he drinks alcohol. He reports that he does not use illicit drugs.  Allergies:  Allergies  Allergen Reactions  . Infed [Iron Dextran] Other (See Comments)    Decreased BP   . Oxycodone Nausea Only  . Vicodin [Hydrocodone-Acetaminophen] Other (See Comments)    Decreased BP     (Not in a hospital admission)  ROS: Out of a complete 14 system review, the patient complains of only the following symptoms, and all other reviewed systems are negative. +dizziness   Physical  Examination: Filed Vitals:   05/23/14 0000  BP: 126/83  Pulse: 81  Temp:   Resp: 19   Physical Exam  Constitutional: He appears well-developed and well-nourished.  Psych: Affect appropriate to situation Eyes: No scleral injection HENT: No OP obstrucion Head: Normocephalic.  Cardiovascular: Normal rate and regular rhythm.  Respiratory: Effort normal and breath sounds normal.  GI: Soft. Bowel sounds are normal. No distension. There is no tenderness.  Skin: WDI   Neurologic Examination: Mental Status: Alert, oriented, thought content appropriate.  Speech fluent without evidence of aphasia. Mild dysarthria. Able to follow 3 step commands without difficulty. Cranial Nerves: [id number] discs not visualized, bilateral horizontal nystagmus noted at rest,, dense left VF cut, pupils equal, round, reactive to light and accommodation III,IV, VI: ptosis not present, extra-ocular motions intact bilaterally V,VII: smile symmetric,neglects left side LT compared to right VIII: hearing normal bilaterally IX,X: gag reflex present XI: trapezius strength/neck flexion strength normal bilaterally XII: tongue strength normal  Motor: RUE and RLE 5/5 strength Proximal LUE 4+/5, distal 5/5 Proximal LLE 5-/5, distal 5/5 Tone and bulk:normal tone throughout; no atrophy noted Sensory: unable to sense left side with 2 point discrimination Deep Tendon Reflexes: 2+ and symmetric throughout Plantars: Right: downgoing   Left: downgoing Cerebellar:  Past pointing with FTN on the left Gait: deferred  Laboratory Studies:   Basic Metabolic Panel: No results for input(s): NA, K, CL, CO2, GLUCOSE, BUN, CREATININE, CALCIUM, MG, PHOS in the last 168 hours.  Liver Function Tests: No results for input(s): AST, ALT, ALKPHOS, BILITOT, PROT, ALBUMIN in the last 168 hours. No results for input(s): LIPASE, AMYLASE in the last 168 hours. No results for input(s): AMMONIA in the last 168 hours.  CBC:  Recent  Labs Lab 05/22/14 2335  WBC 9.6  NEUTROABS 7.0  HGB 11.6*  HCT 36.8*  MCV 92.0  PLT 251    Cardiac Enzymes: No results for input(s): CKTOTAL, CKMB, CKMBINDEX, TROPONINI in the last 168 hours.  BNP: Invalid input(s): POCBNP  CBG:  Recent Labs Lab 05/22/14 2321  GLUCAP 97    Microbiology: Results for orders placed or performed during the hospital encounter of 03/25/09  Anaerobic culture     Status: None   Collection Time: 03/25/09  3:00 PM  Result Value Ref Range Status   Specimen Description WOUND GRAFT ARM LEFT  Final   Special Requests PATIENT ON FOLLOWING ANCEF  Final   Gram Stain   Final    RARE WBC PRESENT, PREDOMINANTLY PMN NO SQUAMOUS EPITHELIAL CELLS SEEN NO ORGANISMS SEEN   Culture NO ANAEROBES ISOLATED  Final   Report Status 03/30/2009 FINAL  Final  Wound culture     Status: None   Collection Time: 03/25/09  3:00 PM  Result Value Ref Range Status   Specimen Description WOUND GRAFT ARM LEFT  Final   Special Requests PATIENT ON FOLLOWING ANCEF  Final   Gram Stain   Final    RARE WBC PRESENT, PREDOMINANTLY PMN NO SQUAMOUS EPITHELIAL CELLS SEEN NO ORGANISMS SEEN   Culture NO GROWTH 2 DAYS  Final   Report Status 03/28/2009 FINAL  Final    Coagulation Studies:  Recent Labs  05/22/14 2335  LABPROT 13.6  INR 1.03    Urinalysis: No results for input(s): COLORURINE, LABSPEC, PHURINE, GLUCOSEU, HGBUR, BILIRUBINUR, KETONESUR, PROTEINUR, UROBILINOGEN, NITRITE, LEUKOCYTESUR in the last 168 hours.  Invalid input(s): APPERANCEUR  Lipid Panel:     Component Value Date/Time   CHOL 160 12/09/2012 1221   TRIG 41 12/09/2012 1221   HDL 67 12/09/2012 1221   CHOLHDL 2.4 12/09/2012 1221   VLDL 8 12/09/2012 1221   LDLCALC 85 12/09/2012 1221    HgbA1C: No results found for: HGBA1C  Urine Drug Screen:  No results found for: LABOPIA, COCAINSCRNUR, LABBENZ, AMPHETMU, THCU, LABBARB  Alcohol Level: No results for input(s): ETH in the last 168 hours.  Other  results:  Imaging: Ct Head (brain) Wo Contrast  05/22/2014   CLINICAL DATA:  Code stroke. Dizziness beginning at 2030 hours. Left-sided weakness. No peripheral vision in the left eye.  EXAM: CT HEAD WITHOUT CONTRAST  TECHNIQUE: Contiguous axial images were obtained from the base of the skull through the vertex without intravenous contrast.  COMPARISON:  MRI brain 12/20/2011. CT angio head 12/18/2011. CT head 05/17/2011.  FINDINGS: There is extensive dural thickening and calcification along the falx and tentorium bilaterally. This is unchanged since prior study. Ventricles and sulci appear symmetrical. No mass effect or midline shift. No abnormal extra-axial fluid collections. Focal low-attenuation in the left caudate lobe probably represents old lacunar infarct. No ventricular dilatation. Gray-white matter junctions are distinct. Basal cisterns are not effaced. No acute intracranial hemorrhage. Mucosal thickening in the paranasal sinuses. Mastoid air cells are not opacified. Vascular calcifications. Calvarium appears  intact. No significant change since prior study.  IMPRESSION: Prominent dural calcification along the falx and tentorium again demonstrated. 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 No acute intracranial abnormalities. No significant change since previous study.  These results were called by telephone at the time of interpretation on 05/22/2014 at 11:26 pm to Dr. Janann Colonel, who verbally acknowledged these results.   Electronically Signed   By: Lucienne Capers M.D.   On: 05/22/2014 23:32    Assessment: 54 y.o. male hx of ESRD on HD, HTN presenting with acute onset of dizziness and nausea after he reports hitting his head getting into his car. Exam pertinent for left VF cut with neglect on left side. With history of trauma followed by neurological symptoms have concern for possible arterial dissection. Alternative, question if VF cut was present earlier and resulted in patient hitting his head on car.  Due to unclear time of onset IV tPA not given. NIHSS of 7 so not an IR candidate. Will be admitted for stroke workup.    Plan: 1. HgbA1c, fasting lipid panel 2. MRI, MRA  of the brain without contrast 3. PT consult, OT consult, Speech consult 4. Echocardiogram 5. Carotid dopplers 6. Prophylactic therapy-ASA 325mg  7. Risk factor modification 8. Telemetry monitoring 9. Frequent neuro checks 10. NPO until RN stroke swallow screen   Jim Like, DO [facility] [phone]  If 7pm- 7am, please page neurology on call as listed in [place]. 05/23/2014, 12:15 AM

## 2014-05-23 NOTE — Consult Note (Signed)
[facility] Renal Consultation Note  Indication for Consultation:  Management of ESRD/hemodialysis; anemia, hypertension/volume and secondary hyperparathyroidism  HPI: Carl Low Sr. is a 54 y.o. male with a history of hypertension, sleep apnea, and ESRD on dialysis at the [facility] who around 7 PM yesterday had sudden onset of dizziness, which continued with mild nausea and lack of coordination of both upper and lower extremities, and eventually went to the ED.  He had been working with his brother throughout the day, but noted that earlier he had bumped his head in his truck.  In the ED he was noted to have left side weakness and decreased peripheral vision in his left eye, but his symptoms have now resolved, and currently he has only a mild headache.  CT of the head was negative for acute findings, but he was seen by Neurology, Dr. Janann Colonel, last night and was admitted for stroke workup, including MRI/MRA this morning.  Dialysis Orders:   MWF @ [facility] 4.5 hrs     108.5 kg      2K/2.25Ca      500/800      AVF @ RFA   Past Medical History  Diagnosis Date  . Hypertension   . Anemia   . Chronic headaches   . Sleep apnea   . Chronic kidney disease     Chronic Renal Failure; On Renal Transplant List  . Presence of arteriovenous fistula for hemodialysis, primary     RUE  . Hemodialysis patient     M-W-F   Past Surgical History  Procedure Laterality Date  . Av fistula placement Right     right arm  . Graft left arm Left     for dialysis x 2  . Insertion of dialysis catheter      Rt chest   Family History  Problem Relation Age of Onset  . Diabetes Father   . Uterine cancer Mother   . Stroke Father   . Hypertension Father   . Lupus Sister   . Stroke Sister   . Anuerysm Brother     brain  . Hypertension Sister    Social History He quit smoking, specifically cigars and rare marijuana, about 23 years ago. He occasionally drinks alcohol, but denies any  history of illicit drugs.  He previously worked in [employer] and currently lives with his son.  Allergies  Allergen Reactions  . Infed [Iron Dextran] Other (See Comments)    Decreased BP   . Oxycodone Nausea Only  . Vicodin [Hydrocodone-Acetaminophen] Other (See Comments)    Decreased BP   Prior to Admission medications   Medication Sig Start Date End Date Taking? Authorizing Provider  calcium acetate (PHOSLO) 667 MG capsule Take 3,335 mg by mouth 3 (three) times daily with meals.    Yes Historical Provider, MD  cinacalcet (SENSIPAR) 60 MG tablet Take 120 mg by mouth daily.    Yes Historical Provider, MD  gabapentin (NEURONTIN) 300 MG capsule Take 300 mg by mouth daily.    Yes Historical Provider, MD  Multiple Vitamin (MULTI VITAMIN MENS PO) Take 1 tablet by mouth daily.     Yes Historical Provider, MD  multivitamin (RENA-VIT) TABS tablet Take 1 tablet by mouth daily.   Yes Historical Provider, MD  ciprofloxacin (CIPRO) 500 MG tablet Take 1 tablet (500 mg total) by mouth daily. Patient not taking: Reported on 05/23/2014 12/22/13   Elmyra Ricks Pisciotta, PA-C  metroNIDAZOLE (FLAGYL) 500 MG tablet Take 1 tablet (500 mg total)  by mouth 2 (two) times daily. One tab PO bid x 10 days Patient not taking: Reported on 05/23/2014 12/22/13   Elmyra Ricks Pisciotta, PA-C  oxyCODONE-acetaminophen (PERCOCET) 5-325 MG per tablet Take 2 tablets by mouth every 4 (four) hours as needed. Patient not taking: Reported on 05/23/2014 09/03/13   Veryl Speak, MD  promethazine (PHENERGAN) 25 MG tablet Take 1 tablet (25 mg total) by mouth every 6 (six) hours as needed for nausea or vomiting. Patient not taking: Reported on 05/23/2014 12/22/13   Elmyra Ricks Pisciotta, PA-C  traMADol (ULTRAM) 50 MG tablet Take 1 tablet (50 mg total) by mouth every 6 (six) hours as needed. Patient not taking: Reported on 05/23/2014 12/22/13   Monico Blitz, PA-C   Labs:  Results for orders placed or performed during the hospital encounter of  05/22/14 (from the past 48 hour(s))  CBG monitoring, ED     Status: None   Collection Time: 05/22/14 11:21 PM  Result Value Ref Range   Glucose-Capillary 97 70 - 99 mg/dL  Protime-INR     Status: None   Collection Time: 05/22/14 11:35 PM  Result Value Ref Range   Prothrombin Time 13.6 11.6 - 15.2 seconds   INR 1.03 0.00 - 1.49  APTT     Status: None   Collection Time: 05/22/14 11:35 PM  Result Value Ref Range   aPTT 28 24 - 37 seconds  CBC     Status: Abnormal   Collection Time: 05/22/14 11:35 PM  Result Value Ref Range   WBC 9.6 4.0 - 10.5 K/uL   RBC 4.00 (L) 4.22 - 5.81 MIL/uL   Hemoglobin 11.6 (L) 13.0 - 17.0 g/dL   HCT 36.8 (L) 39.0 - 52.0 %   MCV 92.0 78.0 - 100.0 fL   MCH 29.0 26.0 - 34.0 pg   MCHC 31.5 30.0 - 36.0 g/dL   RDW 16.5 (H) 11.5 - 15.5 %   Platelets 251 150 - 400 K/uL  Differential     Status: None   Collection Time: 05/22/14 11:35 PM  Result Value Ref Range   Neutrophils Relative % 72 43 - 77 %   Neutro Abs 7.0 1.7 - 7.7 K/uL   Lymphocytes Relative 14 12 - 46 %   Lymphs Abs 1.3 0.7 - 4.0 K/uL   Monocytes Relative 8 3 - 12 %   Monocytes Absolute 0.7 0.1 - 1.0 K/uL   Eosinophils Relative 5 0 - 5 %   Eosinophils Absolute 0.5 0.0 - 0.7 K/uL   Basophils Relative 1 0 - 1 %   Basophils Absolute 0.1 0.0 - 0.1 K/uL  Comprehensive metabolic panel     Status: Abnormal   Collection Time: 05/22/14 11:35 PM  Result Value Ref Range   Sodium 137 135 - 145 mmol/L   Potassium 4.6 3.5 - 5.1 mmol/L   Chloride 95 (L) 96 - 112 mmol/L   CO2 26 19 - 32 mmol/L   Glucose, Bld 86 70 - 99 mg/dL   BUN 49 (H) 6 - 23 mg/dL   Creatinine, Ser 10.71 (H) 0.50 - 1.35 mg/dL   Calcium 8.7 8.4 - 10.5 mg/dL   Total Protein 8.0 6.0 - 8.3 g/dL   Albumin 3.1 (L) 3.5 - 5.2 g/dL   AST 20 0 - 37 U/L   ALT 13 0 - 53 U/L   Alkaline Phosphatase 76 39 - 117 U/L   Total Bilirubin 0.5 0.3 - 1.2 mg/dL   GFR calc non Af Amer 5 (L) >90 mL/min  GFR calc Af Amer 5 (L) >90 mL/min    Comment:  (NOTE) The eGFR has been calculated using the CKD EPI equation. This calculation has not been validated in all clinical situations. eGFR's persistently <90 mL/min signify possible Chronic Kidney Disease.    Anion gap 16 (H) 5 - 15  I-stat troponin, ED (not at [facility], [facility])     Status: None   Collection Time: 05/22/14 11:40 PM  Result Value Ref Range   Troponin i, poc 0.00 0.00 - 0.08 ng/mL   Comment 3            Comment: Due to the release kinetics of cTnI, a negative result within the first hours of the onset of symptoms does not rule out myocardial infarction with certainty. If myocardial infarction is still suspected, repeat the test at appropriate intervals.    Constitutional: negative for chills, fatigue, fevers and sweats Ears, nose, mouth, throat, and face: negative for earaches, hoarseness, nasal congestion and sore throat Respiratory: negative for cough, dyspnea on exertion, hemoptysis and sputum Cardiovascular: negative for chest pain, chest pressure/discomfort, dyspnea, orthopnea and palpitations Gastrointestinal: negative for abdominal pain, change in bowel habits, nausea and vomiting Genitourinary:negative, oliguric Musculoskeletal:negative for arthralgias, back pain, myalgias and neck pain Neurological: positive for headaches, negative for dizziness, gait problems, paresthesia and speech problems  Physical Exam: Filed Vitals:   05/23/14 1000  BP: 130/82  Pulse: 77  Temp: 97.9 F (36.6 C)  Resp: 16     General appearance: alert, cooperative and no distress Head: Normocephalic, without obvious abnormality, atraumatic Neck: no adenopathy, no carotid bruit, no JVD and supple, symmetrical, trachea midline Resp: clear to auscultation bilaterally Cardio: regular rate and rhythm, S1, S2 normal, no murmur, click, rub or gallop GI: soft, non-tender; bowel sounds normal; no masses,  no organomegaly Extremities: extremities normal, atraumatic, no cyanosis or  edema Neurologic: Grossly normal Dialysis Access: AVF @ RFA with + bruit   Assessment/Plan: 1. Dizziness - ? CVA, symptoms resolving, followed by Neurology with stroke workup pending. 2. ESRD - HD on MWF @ [facility], K 4.6.  HD tomorrow, verify orders and meds with center. 3. Hypertension/volume - BP 130/82, no meds; hospital wt 115.9 kg, although pt states wt of 108.5.  4. Anemia - Hgb 11.6, no meds required. 5. Metabolic bone disease - Ca 8.7 (9.4 corrected); Sensipar 120 mg qd, Phoslo 5 with meals. 6. Nutrition - Alb 3.1, renal diet, vitamin.  LYLES,CHARLES 05/23/2014, 10:47 AM   Attending Nephrologist: Roney Jaffe, MD   Pt seen, examined and agree w A/P as above. 53 yo AAM with ESRD on HD for several years. No hx of HTN/ DM , not on any medication for HTN/ DM.  He says cause of ESRD was unknown and he presented too late for kidney biopsy to be of help.  Presenting now with dizzieness, ataxia, visual defect and possibly some L -sided weakness. W/U in progressing including MRI/ MRA today to r/o CVA.  Plan HD tomorrow. BP is stable.  Kelly Splinter MD pager [phone]    cell [phone] 05/23/2014, 3:26 PM

## 2014-05-23 NOTE — H&P (Addendum)
[facility] Admission History and Physical       Carl Subia Sr. [id number] DOB: 1960-06-20 DOA: 05/22/2014  Referring physician: EDP PCP: Lucrezia Starch, MD  Specialists:   Chief Complaint: Dizziness and Nausea  HPI: Carl Denaro Sr. is a 54 y.o. male with of history ESRD on HD ( MWF), HTN, Anemia who presents to the ED with complaints of sudden onset of dizziness and ataxia with nausea around 7 PM.   He also had left sided weakness and loss of his peripheral vision in his left eye.    He reports that he bumped the top of his head 2 hours before his symptoms began.  He denied havign any LOC.  His symptoms persisted so he called EMS, and in the ED he was evaluated as a Code Stroke, and he was not deemed a candidate for TPA since it was unclear of his last known normal time and onset of symptoms.   A Ct scan of the head ws performed which was negative for acute findings.   He was seen by Neurology Dr. Janann Colonel and referred for medical admission.      Review of Systems:  Constitutional: No Weight Loss, No Weight Gain, Night Sweats, Fevers, Chills, Dizziness, Light Headedness, Fatigue, or Generalized Weakness HEENT: No Headaches, Difficulty Swallowing,Tooth/Dental Problems,Sore Throat,  No Sneezing, Rhinitis, Ear Ache, Nasal Congestion, or Post Nasal Drip,  Cardio-vascular:  No Chest pain, Orthopnea, PND, Edema in Lower Extremities, Anasarca, Dizziness, Palpitations  Resp: No Dyspnea, No DOE, No Productive Cough, No Non-Productive Cough, No Hemoptysis, No Wheezing.    GI: No Heartburn, Indigestion, Abdominal Pain, Nausea, Vomiting, Diarrhea, Constipation, Hematemesis, Hematochezia, Melena, Change in Bowel Habits,  Loss of Appetite  GU: No Dysuria, No Change in Color of Urine, No Urgency or Urinary Frequency, No Flank pain.  Musculoskeletal: No Joint Pain or Swelling, No Decreased Range of Motion, No Back Pain.  Neurologic: No Syncope, No Seizures, +Left Sided Weakness,  Paresthesia, +Loss of Vision in Left Eye, Vision Disturbance or Loss, No Diplopia, +Vertigo,+ Ataxia, No Difficulty Walking,  Skin: No Rash or Lesions. Psych: No Change in Mood or Affect, No Depression or Anxiety, No Memory loss, No Confusion, or Hallucinations   Past Medical History  Diagnosis Date  . Hypertension   . Anemia   . Chronic headaches   . Sleep apnea   . Chronic kidney disease     Chronic Renal Failure; On Renal Transplant List  . Presence of arteriovenous fistula for hemodialysis, primary     RUE  . Hemodialysis patient     M-W-F     Past Surgical History  Procedure Laterality Date  . Av fistula placement Right     right arm  . Graft left arm Left     for dialysis x 2  . Insertion of dialysis catheter      Rt chest      Prior to Admission medications   Medication Sig Start Date End Date Taking? Authorizing Provider  calcium acetate (PHOSLO) 667 MG capsule Take 3,335 mg by mouth 3 (three) times daily with meals.    Yes Historical Provider, MD  cinacalcet (SENSIPAR) 60 MG tablet Take 120 mg by mouth daily.    Yes Historical Provider, MD  gabapentin (NEURONTIN) 300 MG capsule Take 300 mg by mouth daily.    Yes Historical Provider, MD  Multiple Vitamin (MULTI VITAMIN MENS PO) Take 1 tablet by mouth daily.     Yes Historical Provider, MD  multivitamin (RENA-VIT) TABS  tablet Take 1 tablet by mouth daily.   Yes Historical Provider, MD  ciprofloxacin (CIPRO) 500 MG tablet Take 1 tablet (500 mg total) by mouth daily. Patient not taking: Reported on 05/23/2014 12/22/13   Elmyra Ricks Pisciotta, PA-C  metroNIDAZOLE (FLAGYL) 500 MG tablet Take 1 tablet (500 mg total) by mouth 2 (two) times daily. One tab PO bid x 10 days Patient not taking: Reported on 05/23/2014 12/22/13   Elmyra Ricks Pisciotta, PA-C  oxyCODONE-acetaminophen (PERCOCET) 5-325 MG per tablet Take 2 tablets by mouth every 4 (four) hours as needed. Patient not taking: Reported on 05/23/2014 09/03/13   Veryl Speak, MD   promethazine (PHENERGAN) 25 MG tablet Take 1 tablet (25 mg total) by mouth every 6 (six) hours as needed for nausea or vomiting. Patient not taking: Reported on 05/23/2014 12/22/13   Elmyra Ricks Pisciotta, PA-C  traMADol (ULTRAM) 50 MG tablet Take 1 tablet (50 mg total) by mouth every 6 (six) hours as needed. Patient not taking: Reported on 05/23/2014 12/22/13   Elmyra Ricks Pisciotta, PA-C     Allergies  Allergen Reactions  . Infed [Iron Dextran] Other (See Comments)    Decreased BP   . Oxycodone Nausea Only  . Vicodin [Hydrocodone-Acetaminophen] Other (See Comments)    Decreased BP    Social History:  reports that he quit smoking about 23 years ago. His smoking use included Cigars. He does not have any smokeless tobacco history on file. He reports that he drinks alcohol. He reports that he does not use illicit drugs.    Family History  Problem Relation Age of Onset  . Diabetes Father   . Uterine cancer Mother   . Stroke Father   . Hypertension Father   . Lupus Sister   . Stroke Sister   . Anuerysm Brother     brain  . Hypertension Sister        Physical Exam:  GEN:  Pleasant Well Nourished and Well Developed 54 y.o. African American male examined and in no acute distress; cooperative with exam Filed Vitals:   05/23/14 0100 05/23/14 0200 05/23/14 0215 05/23/14 0230  BP: 125/86 118/83 135/87 147/88  Pulse: 77 78 83 83  Temp:      TempSrc:      Resp: 17 14 16 22   SpO2: 99% 99% 100% 100%   Blood pressure 147/88, pulse 83, temperature 98.2 F (36.8 C), temperature source Oral, resp. rate 22, SpO2 100 %. PSYCH: He is alert and oriented x4; does not appear anxious does not appear depressed; affect is normal HEENT: Normocephalic and Atraumatic, Mucous membranes pink; PERRLA; EOM intact; Fundi:  Benign;  No scleral icterus, Nares: Patent, Oropharynx: Clear, Fair Dentition,    Neck:  FROM, No Cervical Lymphadenopathy nor Thyromegaly or Carotid Bruit; No JVD; Breasts:: Not  examined CHEST WALL: No tenderness CHEST: Normal respiration, clear to auscultation bilaterally HEART: Regular rate and rhythm; no murmurs rubs or gallops BACK: No kyphosis or scoliosis; No CVA tenderness ABDOMEN: Positive Bowel Sounds, Soft Non-Tender, No Rebound or Guarding; No Masses, No Organomegaly Rectal Exam: Not done EXTREMITIES: No Cyanosis, Clubbing, or Edema; No Ulcerations. Genitalia: not examined PULSES: 2+ and symmetric SKIN: Normal hydration no rash or ulceration CNS:  Alert and Oriented x 4, No Focal Deficits Mental Status:  Alert, Oriented, Thought Content Appropriate. Speech Fluent without evidence of Aphasia. Able to follow 3 step commands without difficulty.  In No obvious pain.   Cranial Nerves:  II: Discs flat bilaterally; Decreased Peripheral Vision in Left Eye.  Pupils  equal and reactive.    III,IV, VI: Extra-ocular motions intact bilaterally    V,VII: smile symmetric, facial light touch sensation normal bilaterally    VIII: hearing intact bilaterally    IX,X: gag reflex present    XI: bilateral shoulder shrug    XII: midline tongue extension   Motor:  Right:  Upper extremity 5/5     Left:  Upper extremity 5/5     Right:  Lower extremity 5/5    Left:  Lower extremity 5/5     Tone and Bulk:  normal tone throughout; no atrophy noted   Sensory:  Pinprick and light touch intact throughout, bilaterally   Deep Tendon Reflexes: 2+ and symmetric throughout   Plantars/ Babinski:  Right: equivocal or upgoing or normal Left: equivocal or upgoing or normal    Cerebellar:  Finger to nose without difficulty.   Gait: deferred    Vascular: pulses palpable throughout    Labs on Admission:  Basic Metabolic Panel:  Recent Labs Lab 05/22/14 2335  NA 137  K 4.6  CL 95*  CO2 26  GLUCOSE 86  BUN 49*  CREATININE 10.71*  CALCIUM 8.7   Liver Function Tests:  Recent Labs Lab 05/22/14 2335  AST 20  ALT 13  ALKPHOS 76  BILITOT 0.5  PROT 8.0  ALBUMIN 3.1*    No results for input(s): LIPASE, AMYLASE in the last 168 hours. No results for input(s): AMMONIA in the last 168 hours. CBC:  Recent Labs Lab 05/22/14 2335  WBC 9.6  NEUTROABS 7.0  HGB 11.6*  HCT 36.8*  MCV 92.0  PLT 251   Cardiac Enzymes: No results for input(s): CKTOTAL, CKMB, CKMBINDEX, TROPONINI in the last 168 hours.  BNP (last 3 results) No results for input(s): BNP in the last 8760 hours.  ProBNP (last 3 results) No results for input(s): PROBNP in the last 8760 hours.  CBG:  Recent Labs Lab 05/22/14 2321  GLUCAP 97    Radiological Exams on Admission: Ct Head (brain) Wo Contrast  05/22/2014   CLINICAL DATA:  Code stroke. Dizziness beginning at 2030 hours. Left-sided weakness. No peripheral vision in the left eye.  EXAM: CT HEAD WITHOUT CONTRAST  TECHNIQUE: Contiguous axial images were obtained from the base of the skull through the vertex without intravenous contrast.  COMPARISON:  MRI brain 12/20/2011. CT angio head 12/18/2011. CT head 05/17/2011.  FINDINGS: There is extensive dural thickening and calcification along the falx and tentorium bilaterally. This is unchanged since prior study. Ventricles and sulci appear symmetrical. No mass effect or midline shift. No abnormal extra-axial fluid collections. Focal low-attenuation in the left caudate lobe probably represents old lacunar infarct. No ventricular dilatation. Gray-white matter junctions are distinct. Basal cisterns are not effaced. No acute intracranial hemorrhage. Mucosal thickening in the paranasal sinuses. Mastoid air cells are not opacified. Vascular calcifications. Calvarium appears intact. No significant change since prior study.  IMPRESSION: Prominent dural calcification along the falx and tentorium again demonstrated. 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 No acute intracranial abnormalities. No significant change since previous study.  These results were called by telephone at the time of interpretation  on 05/22/2014 at 11:26 pm to Dr. Janann Colonel, who verbally acknowledged these results.   Electronically Signed   By: Lucienne Capers M.D.   On: 05/22/2014 23:32     EKG: Independently reviewed. Normal Sinus Rhythm rate = 83;  No acute S-T Changes   Assessment/Plan:   54 y.o. male with  Principal Problem:  1.   CVA (cerebral infarction)   Telemetry Monitoring    CVA Protocol   Neuro Checks   MRI/MRA Brain, Carotid US, and 2D ECHO in AM   Check Lipids, and HbA1C   PT/OT/Speech Eval in AM   Seen by Neuro Dr. Janann Colonel in ED     Active Problems:   2.   ESRD on dialysis- on MWF   Notify Renal/Dialysis Team to keep Schedule   Continue Cincalet, Phoslo, RenaVite and      3.   HTN (hypertension)   Monitor BPs     4.   GERD (gastroesophageal reflux disease)   Protonix      5.   Anemia of renal disease   Monitor Trend of Hb     6.   DVT Prophylaxis   Heparin SQ          Code Status:     FULL CODE        Family Communication:   Son and Brother at Bedside    Disposition Plan:    Inpatient   Status        Time spent:  [facility] Pager [phone]   If [place] Please Contact the Day Rounding Team MD for [facility]  If 7PM-7AM, Please Contact Night-Floor Coverage  [email or web address] Password [email or web address] 05/23/2014, 2:58 AM     ADDENDUM:   Patient was seen and examined on 05/23/2014

## 2014-05-23 NOTE — Progress Notes (Signed)
STROKE TEAM PROGRESS NOTE   HISTORY Carl Dupree Sr. is a 54 y.o. male hx of HTN, ESRD on HD presenting for acute onset of dizziness. Patient states he hit his head getting into his car around 1800 this evening, "shortly later" started noticing dizziness and nausea. Unsure on time of exact onset. Symptoms persist so he called EMS. Also notes some weakness on his left side. Denies any vertigo, no visual changes.   Date last known well: 05/22/14 Time last known well: 1800 tPA Given: no, outside IV tPA window   SUBJECTIVE (INTERVAL HISTORY) The patient's wife is at the bedside. The patient still has a mild headache but denies dizziness. He has been up walking in the room and feels almost back to normal except continued left visual field deficit. MRI results discussed. Instructed the patient not to drive to visual deficit.    OBJECTIVE Temp:  [97.9 F (36.6 C)-98.7 F (37.1 C)] 97.9 F (36.6 C) (04/10 1000) Pulse Rate:  [64-87] 77 (04/10 1000) Cardiac Rhythm:  [-] Normal sinus rhythm (04/10 0800) Resp:  [14-22] 16 (04/10 1000) BP: (103-147)/(64-90) 130/82 mmHg (04/10 1000) SpO2:  [97 %-100 %] 100 % (04/10 1000)   Recent Labs Lab 05/22/14 2321  GLUCAP 97    Recent Labs Lab 05/22/14 2335  NA 137  K 4.6  CL 95*  CO2 26  GLUCOSE 86  BUN 49*  CREATININE 10.71*  CALCIUM 8.7    Recent Labs Lab 05/22/14 2335  AST 20  ALT 13  ALKPHOS 76  BILITOT 0.5  PROT 8.0  ALBUMIN 3.1*    Recent Labs Lab 05/22/14 2335  WBC 9.6  NEUTROABS 7.0  HGB 11.6*  HCT 36.8*  MCV 92.0  PLT 251   No results for input(s): CKTOTAL, CKMB, CKMBINDEX, TROPONINI in the last 168 hours.  Recent Labs  05/22/14 2335  LABPROT 13.6  INR 1.03   No results for input(s): COLORURINE, LABSPEC, PHURINE, GLUCOSEU, HGBUR, BILIRUBINUR, KETONESUR, PROTEINUR, UROBILINOGEN, NITRITE, LEUKOCYTESUR in the last 72 hours.  Invalid input(s): APPERANCEUR     Component Value Date/Time   CHOL 154 05/23/2014  0640   TRIG 50 05/23/2014 0640   HDL 53 05/23/2014 0640   CHOLHDL 2.9 05/23/2014 0640   VLDL 10 05/23/2014 0640   LDLCALC 91 05/23/2014 0640   No results found for: HGBA1C No results found for: LABOPIA, COCAINSCRNUR, LABBENZ, AMPHETMU, THCU, LABBARB  No results for input(s): ETH in the last 168 hours.  Ct Head (brain) Wo Contrast 05/22/2014    Prominent dural calcification along the falx and tentorium again demonstrated. 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 No acute intracranial abnormalities. No significant change since previous study.     Mr Brain Wo Contrast 05/23/2014     MRI HEAD   (This exam was interpreted during a PACS downtime with limited availability of comparison cases. It has been flagged for review following the downtime. If clinically indicated after this review, an addendum will be issued providing details about comparison to prior imaging).    Diffuse dural calcifications/dural thickening.   Parietal-occipital FLAIR abnormality greater on the right suspicious for posterior reversible encephalopathy syndrome.  Other potential causes for this appearance include that secondary to; artifact, meningitis, subarachnoid hemorrhage or result of external supplemental oxygen.   Adequate flow within the superior sagittal sinus cannot be confirmed. This appearance may be chronic and related to the adjacent dural thickening however, sagittal sinus thrombosis could contribute to the parietal occipital FLAIR abnormality.  Suggestion of tiny acute posterior left corona radiata infarct.   Subtle restricted motion medial aspect of the right parietal -occipital lobe may represent result of posterior reversible encephalopathy syndrome or small infarct.   Abnormal vessel extends from the left internal cerebral vein level along the left ambient cistern to the superior margin of the left internal auditory canal. Question small dural fistula.   Opacification left petrous apex.  Abnormal  bone marrow may represent anemia of renal disease.   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  Left parotid 1.4 x 1 centimeter nonspecific lesion incompletely assessed on present exam.    MRA HEAD   Exam is motion degraded.   Mild narrowing and irregularity right internal carotid artery cavernous segment without high-grade stenosis.  Prominent left posterior communicating artery origin infundibulum without discrete aneurysm.  Hypoplastic A1 segment right anterior cerebral artery. Azygous configuration A2 segment anterior cerebral artery.  Middle cerebral artery mild to moderate branch vessel narrowing and irregularity. Non visualized left posterior inferior cerebellar artery. Mild narrowing distal right vertebral artery. Nonvisualized right anterior inferior cerebellar artery.  Posterior cerebral artery branch vessel irregularity.  Irregularity and narrowing superior cerebellar artery more notable on the right.       PHYSICAL EXAM NEUROLOGIC:   MENTAL STATUS: awake, alert, oriented, language fluent,  follows all commands.  CRANIAL NERVES: pupils equal and reactive to light,extraocular muscles intact, facial sensation and strength symmetric, tongue midline. Left visual field deficit. MOTOR: normal bulk and tone, Strength - 5/5 throughout SENSORY: normal and symmetric to light touch  COORDINATION: finger-nose-finger normal     ASSESSMENT/PLAN Mr. Carl Schu Sr. is a 54 y.o. male with history of hypertension and end-stage renal disease on hemodialysis, presenting with acute onset of dizziness. He did not receive IV t-PA due to late presentation.  Stroke: Dominant Suggestion of tiny acute posterior left corona radiata infarct.   Resultant - left visual field deficit - patient instructed not to drive.  MRI  as above  MRA  as above  Carotid Doppler - pending  2D Echo - pending  LDL - 91  HgbA1c pending  Subcutaneous heparin for VTE prophylaxis  Diet Heart Room service appropriate?: Yes;  Fluid consistency:: Thin  no antithrombotic prior to admission, now on aspirin 325 mg orally every day  Patient counseled to be compliant with his antithrombotic medications  Ongoing aggressive stroke risk factor management  Therapy recommendations: Pending  Disposition:  Pending  Hypertension  Home meds:  No antihypertensive medications prior to admission.  Stable   Hyperlipidemia  Home meds:  No lipid lowering medications prior to admission.  LDL 91, goal < 70  Now on Lipitor 10 mg daily  Continue statin at discharge  Other Stroke Risk Factors  Cigarette smoker, quit smoking 23 years ago.  ETOH use  Obesity  Hx stroke/TIA  Family hx stroke (father)   Other Active Problems  Mild anemia  Other Pertinent History  Recent head injury  Hospital day # 0  MRI suggests only a punctate stroke, possibility of PRES syndrome. Clinical examination shows primarily a left inferior quadrant visual field deficit. This appears to be improving over time, the dizziness has improved. Blood pressures have been relatively well controlled in the hospital. The patient was not on antiplatelet agents prior to coming in the hospital.   Mikey Bussing PA-C [facility] Pager [phone] 05/23/2014, 12:21 PM  Lenor Coffin [phone]   To contact Stroke Continuity provider, please refer to [email or web address] After hours, contact General Neurology

## 2014-05-24 DIAGNOSIS — I63312 Cerebral infarction due to thrombosis of left middle cerebral artery: Secondary | ICD-10-CM

## 2014-05-24 DIAGNOSIS — D631 Anemia in chronic kidney disease: Secondary | ICD-10-CM

## 2014-05-24 DIAGNOSIS — E785 Hyperlipidemia, unspecified: Secondary | ICD-10-CM | POA: Insufficient documentation

## 2014-05-24 DIAGNOSIS — N186 End stage renal disease: Secondary | ICD-10-CM

## 2014-05-24 DIAGNOSIS — Z992 Dependence on renal dialysis: Secondary | ICD-10-CM

## 2014-05-24 DIAGNOSIS — I6783 Posterior reversible encephalopathy syndrome: Secondary | ICD-10-CM

## 2014-05-24 DIAGNOSIS — I10 Essential (primary) hypertension: Secondary | ICD-10-CM

## 2014-05-24 DIAGNOSIS — I6789 Other cerebrovascular disease: Secondary | ICD-10-CM

## 2014-05-24 LAB — CBC
HCT: 34.3 % — ABNORMAL LOW (ref 39.0–52.0)
Hemoglobin: 11 g/dL — ABNORMAL LOW (ref 13.0–17.0)
MCH: 28.6 pg (ref 26.0–34.0)
MCHC: 32.1 g/dL (ref 30.0–36.0)
MCV: 89.3 fL (ref 78.0–100.0)
Platelets: 240 10*3/uL (ref 150–400)
RBC: 3.84 MIL/uL — ABNORMAL LOW (ref 4.22–5.81)
RDW: 16.1 % — ABNORMAL HIGH (ref 11.5–15.5)
WBC: 6.4 10*3/uL (ref 4.0–10.5)

## 2014-05-24 LAB — HEMOGLOBIN A1C
Hgb A1c MFr Bld: 5.4 % (ref 4.8–5.6)
Mean Plasma Glucose: 108 mg/dL

## 2014-05-24 LAB — RENAL FUNCTION PANEL
Albumin: 2.6 g/dL — ABNORMAL LOW (ref 3.5–5.2)
Anion gap: 11 (ref 5–15)
BUN: 65 mg/dL — ABNORMAL HIGH (ref 6–23)
CO2: 26 mmol/L (ref 19–32)
Calcium: 8.7 mg/dL (ref 8.4–10.5)
Chloride: 98 mmol/L (ref 96–112)
Creatinine, Ser: 13.33 mg/dL — ABNORMAL HIGH (ref 0.50–1.35)
GFR calc Af Amer: 4 mL/min — ABNORMAL LOW (ref >90)
GFR calc non Af Amer: 4 mL/min — ABNORMAL LOW (ref >90)
Glucose, Bld: 81 mg/dL (ref 70–99)
Phosphorus: 5.7 mg/dL — ABNORMAL HIGH (ref 2.3–4.6)
Potassium: 4.9 mmol/L (ref 3.5–5.1)
Sodium: 135 mmol/L (ref 135–145)

## 2014-05-24 LAB — GLUCOSE, CAPILLARY: Glucose-Capillary: 73 mg/dL (ref 70–99)

## 2014-05-24 MED ORDER — METOPROLOL TARTRATE 25 MG PO TABS
25.0000 mg | ORAL_TABLET | Freq: Two times a day (BID) | ORAL | Status: DC
  Administered 2014-05-24 – 2014-05-25 (×2): 25 mg via ORAL
  Filled 2014-05-24 (×3): qty 1

## 2014-05-24 NOTE — Progress Notes (Signed)
STROKE TEAM PROGRESS NOTE   HISTORY Carl Loadholt Sr. is an 54 y.o. male hx of HTN, ESRD on HD presenting for acute onset of dizziness. Patient states he hit his head getting into his car around 1800 this evening, "shortly later" started noticing dizziness and nausea. Unsure on time of exact onset. Symptoms persist so he called EMS. Also notes some weakness on his left side. Denies any vertigo, no visual changes.   Date last known well: 05/22/14 Time last known well: 1800 tPA Given: no, outside IV tPA window  SUBJECTIVE (INTERVAL HISTORY) No one is at the bedside.  Overall he feels his condition is rapidly improving. He just came back from HD. No acute issue overnight.    OBJECTIVE Temp:  [98.2 F (36.8 C)-98.7 F (37.1 C)] 98.5 F (36.9 C) (04/11 1425) Pulse Rate:  [65-85] 76 (04/11 1425) Cardiac Rhythm:  [-] Normal sinus rhythm (04/11 1353) Resp:  [12-20] 16 (04/11 1425) BP: (108-148)/(69-88) 125/69 mmHg (04/11 1425) SpO2:  [10 %-100 %] 98 % (04/11 1425) Weight:  [108.1 kg (238 lb 5.1 oz)-115.8 kg (255 lb 4.7 oz)] 108.1 kg (238 lb 5.1 oz) (04/11 1323)   Recent Labs Lab 05/22/14 2321 05/24/14 1539  GLUCAP 97 73    Recent Labs Lab 05/22/14 2335 05/24/14 0658  NA 137 135  K 4.6 4.9  CL 95* 98  CO2 26 26  GLUCOSE 86 81  BUN 49* 65*  CREATININE 10.71* 13.33*  CALCIUM 8.7 8.7  PHOS  --  5.7*    Recent Labs Lab 05/22/14 2335 05/24/14 0658  AST 20  --   ALT 13  --   ALKPHOS 76  --   BILITOT 0.5  --   PROT 8.0  --   ALBUMIN 3.1* 2.6*    Recent Labs Lab 05/22/14 2335 05/24/14 0658  WBC 9.6 6.4  NEUTROABS 7.0  --   HGB 11.6* 11.0*  HCT 36.8* 34.3*  MCV 92.0 89.3  PLT 251 240   No results for input(s): CKTOTAL, CKMB, CKMBINDEX, TROPONINI in the last 168 hours.  Recent Labs  05/22/14 2335  LABPROT 13.6  INR 1.03   No results for input(s): COLORURINE, LABSPEC, PHURINE, GLUCOSEU, HGBUR, BILIRUBINUR, KETONESUR, PROTEINUR, UROBILINOGEN, NITRITE,  LEUKOCYTESUR in the last 72 hours.  Invalid input(s): APPERANCEUR     Component Value Date/Time   CHOL 154 05/23/2014 0640   TRIG 50 05/23/2014 0640   HDL 53 05/23/2014 0640   CHOLHDL 2.9 05/23/2014 0640   VLDL 10 05/23/2014 0640   LDLCALC 91 05/23/2014 0640   Lab Results  Component Value Date   HGBA1C 5.4 05/23/2014   No results found for: LABOPIA, COCAINSCRNUR, LABBENZ, AMPHETMU, THCU, LABBARB  No results for input(s): ETH in the last 168 hours.  I have personally reviewed the radiological images below and agree with the radiology interpretations.  Ct Head (brain) Wo Contrast 05/22/2014   Prominent dural calcification along the falx and tentorium again demonstrated. 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 No acute intracranial abnormalities. No significant change since previous study.    MRI HEAD   05/23/2014   Diffuse dural calcifications/dural thickening.  Parietal-occipital FLAIR abnormality greater on the right suspicious for posterior reversible encephalopathy syndrome. Other potential causes for this appearance include that secondary to; artifact, meningitis, subarachnoid hemorrhage or result of external supplemental oxygen.  Adequate flow within the superior sagittal sinus cannot be confirmed. This appearance may be chronic and related to the adjacent dural thickening however, sagittal sinus thrombosis  could contribute to the parietal occipital FLAIR abnormality.  Suggestion of tiny acute posterior left corona radiata infarct.  Subtle restricted motion medial aspect of the right parietal -occipital lobe may represent result of posterior reversible encephalopathy syndrome or small infarct.  Abnormal vessel extends from the left internal cerebral vein level along the left ambient cistern to the superior margin of the left internal auditory canal. Question small dural fistula.  Opacification left petrous apex.  Abnormal bone marrow may represent anemia of renal disease.  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  Left parotid 1.4 x 1 centimeter nonspecific lesion incompletely assessed on present exam.   MRA HEAD   05/23/2014    Exam is motion degraded.  Mild narrowing and irregularity right internal carotid artery cavernous segment without high-grade stenosis.  Prominent left posterior communicating artery origin infundibulum without discrete aneurysm.  Hypoplastic A1 segment right anterior cerebral artery. Azygous configuration A2 segment anterior cerebral artery.  Middle cerebral artery mild to moderate branch vessel narrowing and irregularity.  Non visualized left posterior inferior cerebellar artery.  Mild narrowing distal right vertebral artery.  Nonvisualized right anterior inferior cerebellar artery.  Posterior cerebral artery branch vessel irregularity.  Irregularity and narrowing superior cerebellar artery more notable on the right.     2D echo - - Left ventricle: The cavity size was normal. Systolic function was normal. The estimated ejection fraction was in the range of 55% to 60%. Wall motion was normal; there were no regional wall motion abnormalities. Doppler parameters are consistent with abnormal left ventricular relaxation (grade 1 diastolic dysfunction). - Pericardium, extracardiac: A trivial pericardial effusion was identified.  CUS - Bilateral: 1-39% ICA stenosis. Vertebral artery flow is antegrade.   PHYSICAL EXAM  Temp:  [98.3 F (36.8 C)-98.7 F (37.1 C)] 98.4 F (36.9 C) (04/11 1740) Pulse Rate:  [65-91] 91 (04/11 1740) Resp:  [12-20] 20 (04/11 1740) BP: (108-148)/(69-88) 125/71 mmHg (04/11 1740) SpO2:  [10 %-100 %] 99 % (04/11 1740) Weight:  [238 lb 5.1 oz (108.1 kg)-255 lb 4.7 oz (115.8 kg)] 238 lb 5.1 oz (108.1 kg) (04/11 1323)  General - Well nourished, well developed, in no apparent distress.  Ophthalmologic - Sharp disc margins OU.  Cardiovascular - Regular rate and rhythm with no murmur.  Mental Status -  Level of arousal and  orientation to time, place, and person were intact. Language including expression, naming, repetition, comprehension was assessed and found intact. Fund of Knowledge was assessed and was impaired and not knowing previous presidents.  Cranial Nerves II - XII - II - Visual field intact OU. III, IV, VI - Extraocular movements intact. V - Facial sensation intact bilaterally. VII - Facial movement intact bilaterally. VIII - Hearing & vestibular intact bilaterally. X - Palate elevates symmetrically. XI - Chin turning & shoulder shrug intact bilaterally. XII - Tongue protrusion intact.  Motor Strength - The patient's strength was normal in all extremities and pronator drift was absent.  Bulk was normal and fasciculations were absent.   Motor Tone - Muscle tone was assessed at the neck and appendages and was normal.  Reflexes - The patient's reflexes were 1+ in all extremities and he had no pathological reflexes.  Sensory - Light touch, temperature/pinprick, vibration and proprioception, and Romberg testing were assessed and were symmetrical.    Coordination - The patient had normal movements in the hands and feet with no ataxia or dysmetria.  Tremor was absent.  Gait and Station - deferred due to fatigue after HD.    ASSESSMENT/PLAN  Mr. Carl Followell Sr. is a 54 y.o. male with history of HTN, ESRD on HD presenting with acute onset of dizziness. He did not receive IV t-PA due to delay in arrival.   ? Stroke:  May be left tiny corona radiata acute infarct. Right occipital pole tiny DWI changes may related to PRES.   Resultant  Deficit resolution  MRI  As above  MRA  Diffuse intracranial athero  Carotid Doppler  unremarkable  2D Echo  Unremarkable  LDL 91  Hemoglobin A1c 5.4  Heparin 5000 units sq tid for VTE prophylaxis  Diet renal with fluid restriction Fluid restriction:: 1200 mL Fluid; Room service appropriate?: Yes; Fluid consistency:: Thin  no antithrombotic prior to  admission, now on aspirin 325 mg orally every day  Patient counseled to be compliant with his antithrombotic medications  Ongoing aggressive stroke risk factor management  Therapy recommendations:  Home health speech therapy  Disposition:  Pending  PRES  BP within target  PRES could be related to HD  Continue BP control   Repeat MRI in 2-3 months.  Hypertension  Home meds:   none  Stable  Hyperlipidemia  Home meds:  No statin  LDL 91, goal < 70  Now on Lipitor 10 daily  Continue statin at discharge  Other Stroke Risk Factors  Cigarette smoker, quit smoking 23 years ago   ETOH use  Obesity, Body mass index is 30.59 kg/(m^2).   Hx stroke/TIA  Family hx stroke (father)  Other Active Problems  Mild anemia  ESRD on HD Monday Wednesday Friday schedule  Peripheral neuropathy on Neurontin  Other Pertinent History  Recent head injury  Hospital day # 1  Neurology will sign off. Please call with questions. Pt will follow up with Dr. Erlinda Hong at [facility] in about 2 months. Thanks for the consult.  Rosalin Hawking, MD PhD Stroke Neurology 05/24/2014 8:37 PM   To contact Stroke Continuity provider, please refer to [email or web address] After hours, contact General Neurology

## 2014-05-24 NOTE — Procedures (Signed)
I have seen and examined this patient and agree with the plan of care. Patient has been seen on dialysis and as no issues today  [name] W 05/24/2014, 9:11 AM

## 2014-05-24 NOTE — Progress Notes (Signed)
Patient Demographics  [name], is a 54 y.o. male, DOB - September 14, 1960, [id number]  Admit date - 05/22/2014   Admitting Physician Theressa Millard, MD  Outpatient Primary MD for the patient is Lucrezia Starch, MD  LOS - 1   Chief Complaint  Patient presents with  . Dizziness        Subjective:   [name] today has, No headache, No chest pain, No abdominal pain - No Nausea, No new weakness tingling or numbness, No Cough - SOB.    Assessment & Plan    1. ? Small R. parieto-occipital lobe infarct. Also had head injury prior to the episode, currently on aspirin and added statin as LDL above 70, A1c stable, full stroke workup underway, stable on tele ( ST x 1), obtain PT, OT, speech input, echogram, carotid duplex, neurology following.  ? PRESS - BP stable, will add lopressor for better control.   Lab Results  Component Value Date   HGBA1C 5.4 05/23/2014    Lab Results  Component Value Date   CHOL 154 05/23/2014   HDL 53 05/23/2014   LDLCALC 91 05/23/2014   TRIG 50 05/23/2014   CHOLHDL 2.9 05/23/2014     2. ESRD. Monday, Wednesday and Friday dialysis schedule. Renal consulted.   3. Peripheral neuropathy. Continue Neurontin.   4.AOCD - stable.    Code Status: full  Family Communication: none  Disposition Plan: home   Procedures MRI-A Brain, TTE, Carotids  Consults Neuro, renal   Medications  Scheduled Meds: . alteplase  90 mg Intravenous Once   Followed by  . sodium chloride  50 mL Intravenous Once  . aspirin  300 mg Rectal Daily   Or  . aspirin  325 mg Oral Daily  . atorvastatin  10 mg Oral q1800  . calcium acetate  3,335 mg Oral TID WC  . cinacalcet  120 mg Oral Q breakfast  . gabapentin  300 mg Oral Daily  . heparin  5,000 Units  Subcutaneous 3 times per day  . multivitamin  1 tablet Oral Daily   Continuous Infusions:  PRN Meds:.acetaminophen, calcium carbonate, HYDROmorphone (DILAUDID) injection, senna-docusate  DVT Prophylaxis    Heparin    Lab Results  Component Value Date   PLT 240 05/24/2014    Antibiotics     Anti-infectives    None          Objective:   Filed Vitals:   05/24/14 0858 05/24/14 0930 05/24/14 1000 05/24/14 1030  BP: 147/88 144/88 148/80 147/87  Pulse: 68 70 75 69  Temp:      TempSrc:      Resp:      Height:      Weight:      SpO2:        Wt Readings from Last 3 Encounters:  05/24/14 113.5 kg (250 lb 3.6 oz)  12/15/12 115.894 kg (255 lb 8 oz)  12/09/12 120.112 kg (264 lb 12.8 oz)    No intake or output data in the 24 hours ending 05/24/14 1108   Physical Exam  Awake Alert, Oriented X 3, No new F.N deficits, Normal affect [place].AT,PERRAL Supple Neck,No JVD, No cervical lymphadenopathy appriciated.  Symmetrical Chest wall movement, Good air movement bilaterally, CTAB  RRR,No Gallops,Rubs or new Murmurs, No Parasternal Heave +ve B.Sounds, Abd Soft, No tenderness, No organomegaly appriciated, No rebound - guarding or rigidity. No Cyanosis, Clubbing or edema, No new Rash or bruise      Data Review   Micro Results No results found for this or any previous visit (from the past 240 hour(s)).  Radiology Reports Ct Head (brain) Wo Contrast  05/22/2014   CLINICAL DATA:  Code stroke. Dizziness beginning at 2030 hours. Left-sided weakness. No peripheral vision in the left eye.  EXAM: CT HEAD WITHOUT CONTRAST  TECHNIQUE: Contiguous axial images were obtained from the base of the skull through the vertex without intravenous contrast.  COMPARISON:  MRI brain 12/20/2011. CT angio head 12/18/2011. CT head 05/17/2011.  FINDINGS: There is extensive dural thickening and calcification along the falx and tentorium bilaterally. This is unchanged since prior study. Ventricles and sulci  appear symmetrical. No mass effect or midline shift. No abnormal extra-axial fluid collections. Focal low-attenuation in the left caudate lobe probably represents old lacunar infarct. No ventricular dilatation. Gray-white matter junctions are distinct. Basal cisterns are not effaced. No acute intracranial hemorrhage. Mucosal thickening in the paranasal sinuses. Mastoid air cells are not opacified. Vascular calcifications. Calvarium appears intact. No significant change since prior study.  IMPRESSION: Prominent dural calcification along the falx and tentorium again demonstrated. 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 No acute intracranial abnormalities. No significant change since previous study.  These results were called by telephone at the time of interpretation on 05/22/2014 at 11:26 pm to Dr. Janann Colonel, who verbally acknowledged these results.   Electronically Signed   By: Lucienne Capers M.D.   On: 05/22/2014 23:32   Mr Brain Wo Contrast  05/23/2014   CLINICAL DATA:  54 year old hypertensive male with end-stage renal disease on dialysis presenting with sudden onset of dizziness, ataxia and nausea. Left-sided weakness with peripheral visual loss left eye. Subsequent encounter.  EXAM: MRI HEAD WITHOUT CONTRAST  MRA HEAD WITHOUT CONTRAST  TECHNIQUE: Multiplanar, multiecho pulse sequences of the brain and surrounding structures were obtained without intravenous contrast. Angiographic images of the head were obtained using MRA technique without contrast.  COMPARISON:  No comparison exams available for direct review at the current time.  FINDINGS: MRI HEAD FINDINGS  This exam was interpreted during a PACS downtime with limited availability of comparison cases. It has been flagged for review following the downtime. If clinically indicated after this review, an addendum will be issued providing details about comparison to prior imaging.  Diffuse dural calcifications/dural thickening.  Parietal-occipital FLAIR  abnormality greater on the right suspicious for posterior reversible encephalopathy syndrome. Other potential causes for this appearance include that secondary to; artifact, meningitis, subarachnoid hemorrhage or result of external supplemental oxygen.  Adequate flow within the superior sagittal sinus cannot be confirmed. This appearance may be chronic and related to the adjacent dural thickening however, sagittal sinus thrombosis could contribute to the parietal occipital FLAIR abnormality.  Suggestion of tiny acute posterior left corona radiata infarct.  Subtle restricted motion medial aspect of the right parietal -occipital lobe may represent result of posterior reversible encephalopathy syndrome or small infarct.  Subtle diffusion abnormality within the mid to lower pons may reflect artifact rather than acute infarct.  Abnormal vessel extends from the left internal cerebral vein level along the left ambient cistern to the superior margin of the left internal auditory canal. Question small dural fistula.  Opacification left petrous apex.  Abnormal bone marrow may represent anemia of renal disease.  Partially empty slightly expanded sella without other findings of pseudotumor cerebri. Mild exophthalmos.  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  Polypoid opacification inferior aspect left maxillary sinus. Minimal mucosal thickening ethmoid sinus air cells.  Left parotid 1.4 x 1 centimeter nonspecific lesion incompletely assessed on present exam.  MRA HEAD FINDINGS  Exam is motion degraded.  Mild narrowing and irregularity right internal carotid artery cavernous segment without high-grade stenosis.  Prominent left posterior communicating artery origin infundibulum without discrete aneurysm.  Hypoplastic A1 segment right anterior cerebral artery. Azygous configuration A2 segment anterior cerebral artery.  Middle cerebral artery mild to moderate branch vessel narrowing and irregularity.  Left vertebral artery is dominant. Non  visualized left posterior inferior cerebellar artery.  Mild narrowing distal right vertebral artery.  Nonvisualized right anterior inferior cerebellar artery.  No significant stenosis basilar artery.  Posterior cerebral artery branch vessel irregularity.  Irregularity narrowing superior cerebellar artery more notable on the right.  IMPRESSION: MRI HEAD  This exam was interpreted during a PACS downtime with limited availability of comparison cases. It has been flagged for review following the downtime. If clinically indicated after this review, an addendum will be issued providing details about comparison to prior imaging.  Diffuse dural calcifications/dural thickening.  Parietal-occipital FLAIR abnormality greater on the right suspicious for posterior reversible encephalopathy syndrome. Other potential causes for this appearance include that secondary to; artifact, meningitis, subarachnoid hemorrhage or result of external supplemental oxygen.  Adequate flow within the superior sagittal sinus cannot be confirmed. This appearance may be chronic and related to the adjacent dural thickening however, sagittal sinus thrombosis could contribute to the parietal occipital FLAIR abnormality.  Suggestion of tiny acute posterior left corona radiata infarct.  Subtle restricted motion medial aspect of the right parietal -occipital lobe may represent result of posterior reversible encephalopathy syndrome or small infarct.  Abnormal vessel extends from the left internal cerebral vein level along the left ambient cistern to the superior margin of the left internal auditory canal. Question small dural fistula.  Opacification left petrous apex.  Abnormal bone marrow may represent anemia of renal disease.  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  Left parotid 1.4 x 1 centimeter nonspecific lesion incompletely assessed on present exam.  MRA HEAD  Exam is motion degraded.  Mild narrowing and irregularity right internal carotid artery cavernous segment  without high-grade stenosis.  Prominent left posterior communicating artery origin infundibulum without discrete aneurysm.  Hypoplastic A1 segment right anterior cerebral artery. Azygous configuration A2 segment anterior cerebral artery.  Middle cerebral artery mild to moderate branch vessel narrowing and irregularity.  Non visualized left posterior inferior cerebellar artery.  Mild narrowing distal right vertebral artery.  Nonvisualized right anterior inferior cerebellar artery.  Posterior cerebral artery branch vessel irregularity.  Irregularity and narrowing superior cerebellar artery more notable on the right.   Electronically Signed   By: Genia Del M.D.   On: 05/23/2014 08:34   Mr Jodene Nam Head/brain Wo Cm  05/23/2014   CLINICAL DATA:  54 year old hypertensive male with end-stage renal disease on dialysis presenting with sudden onset of dizziness, ataxia and nausea. Left-sided weakness with peripheral visual loss left eye. Subsequent encounter.  EXAM: MRI HEAD WITHOUT CONTRAST  MRA HEAD WITHOUT CONTRAST  TECHNIQUE: Multiplanar, multiecho pulse sequences of the brain and surrounding structures were obtained without intravenous contrast. Angiographic images of the head were obtained using MRA technique without contrast.  COMPARISON:  No comparison exams available for direct review at the current time.  FINDINGS: MRI HEAD FINDINGS  This exam was interpreted during  a PACS downtime with limited availability of comparison cases. It has been flagged for review following the downtime. If clinically indicated after this review, an addendum will be issued providing details about comparison to prior imaging.  Diffuse dural calcifications/dural thickening.  Parietal-occipital FLAIR abnormality greater on the right suspicious for posterior reversible encephalopathy syndrome. Other potential causes for this appearance include that secondary to; artifact, meningitis, subarachnoid hemorrhage or result of external  supplemental oxygen.  Adequate flow within the superior sagittal sinus cannot be confirmed. This appearance may be chronic and related to the adjacent dural thickening however, sagittal sinus thrombosis could contribute to the parietal occipital FLAIR abnormality.  Suggestion of tiny acute posterior left corona radiata infarct.  Subtle restricted motion medial aspect of the right parietal -occipital lobe may represent result of posterior reversible encephalopathy syndrome or small infarct.  Subtle diffusion abnormality within the mid to lower pons may reflect artifact rather than acute infarct.  Abnormal vessel extends from the left internal cerebral vein level along the left ambient cistern to the superior margin of the left internal auditory canal. Question small dural fistula.  Opacification left petrous apex.  Abnormal bone marrow may represent anemia of renal disease.  Partially empty slightly expanded sella without other findings of pseudotumor cerebri. Mild exophthalmos.  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  Polypoid opacification inferior aspect left maxillary sinus. Minimal mucosal thickening ethmoid sinus air cells.  Left parotid 1.4 x 1 centimeter nonspecific lesion incompletely assessed on present exam.  MRA HEAD FINDINGS  Exam is motion degraded.  Mild narrowing and irregularity right internal carotid artery cavernous segment without high-grade stenosis.  Prominent left posterior communicating artery origin infundibulum without discrete aneurysm.  Hypoplastic A1 segment right anterior cerebral artery. Azygous configuration A2 segment anterior cerebral artery.  Middle cerebral artery mild to moderate branch vessel narrowing and irregularity.  Left vertebral artery is dominant. Non visualized left posterior inferior cerebellar artery.  Mild narrowing distal right vertebral artery.  Nonvisualized right anterior inferior cerebellar artery.  No significant stenosis basilar artery.  Posterior cerebral artery branch  vessel irregularity.  Irregularity narrowing superior cerebellar artery more notable on the right.  IMPRESSION: MRI HEAD  This exam was interpreted during a PACS downtime with limited availability of comparison cases. It has been flagged for review following the downtime. If clinically indicated after this review, an addendum will be issued providing details about comparison to prior imaging.  Diffuse dural calcifications/dural thickening.  Parietal-occipital FLAIR abnormality greater on the right suspicious for posterior reversible encephalopathy syndrome. Other potential causes for this appearance include that secondary to; artifact, meningitis, subarachnoid hemorrhage or result of external supplemental oxygen.  Adequate flow within the superior sagittal sinus cannot be confirmed. This appearance may be chronic and related to the adjacent dural thickening however, sagittal sinus thrombosis could contribute to the parietal occipital FLAIR abnormality.  Suggestion of tiny acute posterior left corona radiata infarct.  Subtle restricted motion medial aspect of the right parietal -occipital lobe may represent result of posterior reversible encephalopathy syndrome or small infarct.  Abnormal vessel extends from the left internal cerebral vein level along the left ambient cistern to the superior margin of the left internal auditory canal. Question small dural fistula.  Opacification left petrous apex.  Abnormal bone marrow may represent anemia of renal disease.  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  Left parotid 1.4 x 1 centimeter nonspecific lesion incompletely assessed on present exam.  MRA HEAD  Exam is motion degraded.  Mild narrowing and irregularity right internal carotid artery cavernous  segment without high-grade stenosis.  Prominent left posterior communicating artery origin infundibulum without discrete aneurysm.  Hypoplastic A1 segment right anterior cerebral artery. Azygous configuration A2 segment anterior cerebral  artery.  Middle cerebral artery mild to moderate branch vessel narrowing and irregularity.  Non visualized left posterior inferior cerebellar artery.  Mild narrowing distal right vertebral artery.  Nonvisualized right anterior inferior cerebellar artery.  Posterior cerebral artery branch vessel irregularity.  Irregularity and narrowing superior cerebellar artery more notable on the right.   Electronically Signed   By: Genia Del M.D.   On: 05/23/2014 08:34     CBC  Recent Labs Lab 05/22/14 2335 05/24/14 0658  WBC 9.6 6.4  HGB 11.6* 11.0*  HCT 36.8* 34.3*  PLT 251 240  MCV 92.0 89.3  MCH 29.0 28.6  MCHC 31.5 32.1  RDW 16.5* 16.1*  LYMPHSABS 1.3  --   MONOABS 0.7  --   EOSABS 0.5  --   BASOSABS 0.1  --     Chemistries   Recent Labs Lab 05/22/14 2335 05/24/14 0658  NA 137 135  K 4.6 4.9  CL 95* 98  CO2 26 26  GLUCOSE 86 81  BUN 49* 65*  CREATININE 10.71* 13.33*  CALCIUM 8.7 8.7  AST 20  --   ALT 13  --   ALKPHOS 76  --   BILITOT 0.5  --    ------------------------------------------------------------------------------------------------------------------ estimated creatinine clearance is 8.4 mL/min (by C-G formula based on Cr of 13.33). ------------------------------------------------------------------------------------------------------------------  Recent Labs  05/23/14 0640  HGBA1C 5.4   ------------------------------------------------------------------------------------------------------------------  Recent Labs  05/23/14 0640  CHOL 154  HDL 53  LDLCALC 91  TRIG 50  CHOLHDL 2.9   ------------------------------------------------------------------------------------------------------------------ No results for input(s): TSH, T4TOTAL, T3FREE, THYROIDAB in the last 72 hours.  Invalid input(s): FREET3 ------------------------------------------------------------------------------------------------------------------ No results for input(s): VITAMINB12,  FOLATE, FERRITIN, TIBC, IRON, RETICCTPCT in the last 72 hours.  Coagulation profile  Recent Labs Lab 05/22/14 2335  INR 1.03    No results for input(s): DDIMER in the last 72 hours.  Cardiac Enzymes No results for input(s): CKMB, TROPONINI, MYOGLOBIN in the last 168 hours.  Invalid input(s): CK ------------------------------------------------------------------------------------------------------------------ Invalid input(s): POCBNP     Time Spent in minutes  35   Tamrah Victorino K M.D on 05/24/2014 at 11:08 AM  Between 7am to 7pm - Pager - [phone]  After 7pm go to [email or web address] - password [facility]  [facility]   Office  [phone]

## 2014-05-24 NOTE — Progress Notes (Signed)
CARE MANAGEMENT NOTE 05/24/2014  Patient:  [name], [name]   Account Number:  [id number]  Date Initiated:  05/24/2014  Documentation initiated by:  Olga Coaster  Subjective/Objective Assessment:   ADMITTED WITH STROKE     Action/Plan:   CM FOLLOWING FOR DCP   Anticipated DC Date:  05/26/2014   Anticipated DC Plan:  AWAITING FOR PT/OT EVALS FOR DISPOSITION NEEDS     DC Planning Services  CM consult           Status of service:  In process, will continue to follow  Per UR Regulation:  Reviewed for med. necessity/level of care/duration of stay  Comments:  4/11/2016Mindi Slicker RN,BSN,MHA [phone]

## 2014-05-24 NOTE — Progress Notes (Signed)
  Echocardiogram 2D Echocardiogram has been performed.  [name] 05/24/2014, 2:58 PM

## 2014-05-24 NOTE — Evaluation (Signed)
Physical Therapy Evaluation Patient Details Name: [name]. MRN: [id number] DOB: 1960/11/01 Today's Date: 05/24/2014   History of Present Illness  Patient is a 54 y/o male with PMH of ESRD on HD (MWF), HTN and anemia who presents with complaints of dizziness, ataxia, left sided weakness with nausea. Reports loss of vision in periphery of left eye and bumping head prior to symptoms. MRI brain- possible acute posterior left corona radiata infarct; ? Small R. parieto-occipital lobe infarct.     Clinical Impression  Patient presents with generalized weakness, visual deficits, balance deficits and ?possible cardiovascular impairments impacting safe mobility. Limited mobility assessment secondary to difficulty obtaining accurate HR (reading from 95-220 bpm). RN notified. Recommend ambulation later with RN to assess HR response to exercise. Would benefit from 1 additional follow up therapy session in hospital to further assess higher level balance, endurance and safety with mobility prior to return home.    Follow Up Recommendations Outpatient PT;Supervision - Intermittent (possibly OPPT pending further assessment.)    Equipment Recommendations  None recommended by PT    Recommendations for Other Services OT consult     Precautions / Restrictions Precautions Precautions: Fall Restrictions Weight Bearing Restrictions: No      Mobility  Bed Mobility Overal bed mobility: Modified Independent                Transfers Overall transfer level: Needs assistance Equipment used: None Transfers: Sit to/from Stand Sit to Stand: Min guard         General transfer comment: Unsteady upon standing initially.  Ambulation/Gait Ambulation/Gait assistance: Min guard Ambulation Distance (Feet): 200 Feet Assistive device: None Gait Pattern/deviations: Step-through pattern;Decreased stride length;Drifts right/left;Wide base of support   Gait velocity interpretation: Below normal  speed for age/gender General Gait Details: Pt with slow, mildly unsteady gait. Difficult to obtain accurate hR due to poor reading on heart monitor and dinamap (ranging from 96-216 bpm). Taken manually HR changed from 135 bpm during gait to 95 bpm? Pt asymptomatic.   Stairs            Wheelchair Mobility    Modified Rankin (Stroke Patients Only) Modified Rankin (Stroke Patients Only) Pre-Morbid Rankin Score: No symptoms Modified Rankin: No significant disability     Balance Overall balance assessment: Needs assistance Sitting-balance support: Feet supported;No upper extremity supported Sitting balance-Leahy Scale: Good     Standing balance support: During functional activity Standing balance-Leahy Scale: Fair                 High Level Balance Comments: Not able to assess secondary to difficulty obtaining HR, pt needing to eat (had not eaten all day) and transport waiting to take pt down for Doppler.             Pertinent Vitals/Pain Pain Assessment: No/denies pain    Home Living Family/patient expects to be discharged to:: Private residence Living Arrangements: Children Available Help at Discharge: Family;Friend(s);Available PRN/intermittently Type of Home: House Home Access: Stairs to enter Entrance Stairs-Rails: Right Entrance Stairs-Number of Steps: 2 Home Layout: One level Home Equipment: None      Prior Function Level of Independence: Independent         Comments: Drives self to dialysis.     Hand Dominance        Extremity/Trunk Assessment   Upper Extremity Assessment: Defer to OT evaluation           Lower Extremity Assessment:  (Reports numbness in toes = premorbid)  Communication   Communication: No difficulties  Cognition Arousal/Alertness: Awake/alert Behavior During Therapy: WFL for tasks assessed/performed Overall Cognitive Status: Within Functional Limits for tasks assessed (Seems to have awareness of  deficits.)                      General Comments      Exercises        Assessment/Plan    PT Assessment Patient needs continued PT services  PT Diagnosis Difficulty walking;Generalized weakness   PT Problem List Cardiopulmonary status limiting activity;Decreased strength;Impaired sensation;Decreased mobility;Decreased balance  PT Treatment Interventions Balance training;Gait training;Stair training;Patient/family education;Functional mobility training;Therapeutic activities;Therapeutic exercise;Neuromuscular re-education   PT Goals (Current goals can be found in the Care Plan section) Acute Rehab PT Goals Patient Stated Goal: to get something to eat and go home later today PT Goal Formulation: With patient Time For Goal Achievement: 06/07/14 Potential to Achieve Goals: Good    Frequency Min 4X/week   Barriers to discharge Decreased caregiver support      Co-evaluation               End of Session Equipment Utilized During Treatment: Gait belt Activity Tolerance: Patient tolerated treatment well;Treatment limited secondary to medical complications (Comment) (Inconsistent HR reading?) Patient left: in bed;with call bell/phone within reach;Other (comment) (With transport taking down for test.) Nurse Communication: Mobility status;Other (comment) (HR. RN present during ambulation assessment and vitals assessment.)         Time: [id number] PT Time Calculation (min) (ACUTE ONLY): 16 min   Charges:   PT Evaluation $Initial PT Evaluation Tier I: 1 Procedure     PT G CodesCandy Sledge A Jun 01, 2014, 4:32 PM  Candy Sledge, PT, DPT [phone]

## 2014-05-24 NOTE — Progress Notes (Signed)
PT Cancellation Note  Patient Details Name: Carl Sarker Sr. MRN: [id number] DOB: May 12, 1960   Cancelled Treatment:    Reason Eval/Treat Not Completed: Patient at procedure or test/unavailable pt off floor at HD. Will follow up next available time.   Candy Sledge A 05/24/2014, 9:15 AM Candy Sledge, PT, DPT [phone]

## 2014-05-24 NOTE — Progress Notes (Signed)
*  PRELIMINARY RESULTS* Vascular Ultrasound Carotid Duplex has been completed.  Preliminary findings: Bilateral:  1-39% ICA stenosis.  Vertebral artery flow is antegrade.      Landry Mellow, RDMS, RVT  05/24/2014, 4:37 PM

## 2014-05-24 NOTE — Progress Notes (Signed)
OT Cancellation Note  Patient Details Name: Carl Letourneau Sr. MRN: [id number] DOB: 18-Feb-1960   Cancelled Treatment:    Reason Eval/Treat Not Completed: Patient at procedure or test/ unavailable (HD) OT to reattempt as appropriate. Pt currently off unit in HD.  Parke Poisson B 05/24/2014, 9:04 AM  Pager: [phone]

## 2014-05-25 MED ORDER — ATORVASTATIN CALCIUM 10 MG PO TABS: 10.0000 mg | ORAL_TABLET | Freq: Every day | ORAL | Status: DC

## 2014-05-25 MED ORDER — ASPIRIN 325 MG PO TABS: 325.0000 mg | ORAL_TABLET | Freq: Every day | ORAL | Status: DC

## 2014-05-25 MED ORDER — METOPROLOL TARTRATE 25 MG PO TABS: 25.0000 mg | ORAL_TABLET | Freq: Two times a day (BID) | ORAL | Status: DC

## 2014-05-25 NOTE — Progress Notes (Signed)
D/C orders received, pt for D/C home today.  IV and telemetry D/C.  Rx and D/C instructions given with verbalized understanding.  Family at bedside to assist with D/C.  Staff brought pt downstairs via wheelchair.  

## 2014-05-25 NOTE — Discharge Summary (Signed)
Carl Fucile Sr., is a 54 y.o. male  DOB 1960-03-11  MRN [id number].  Admission date:  05/22/2014  Admitting Physician  Theressa Millard, MD  Discharge Date:  05/25/2014   Primary MD  Lucrezia Starch, MD  Recommendations for primary care physician for things to follow:   Check TSH, CBC BMP next visit.  In time outpatient follow-up with cardiology   Admission Diagnosis  Stroke [I63.9]   Discharge Diagnosis  Stroke [I63.9] ?  Principal Problem:   CVA (cerebral infarction) Active Problems:   HTN (hypertension)   GERD (gastroesophageal reflux disease)   ESRD on dialysis   Anemia of renal disease   HLD (hyperlipidemia)   PRES (posterior reversible encephalopathy syndrome)      Past Medical History  Diagnosis Date  . Hypertension   . Anemia   . Chronic headaches   . Sleep apnea   . Chronic kidney disease     Chronic Renal Failure; On Renal Transplant List  . Presence of arteriovenous fistula for hemodialysis, primary     RUE  . Hemodialysis patient     M-W-F    Past Surgical History  Procedure Laterality Date  . Av fistula placement Right     right arm  . Graft left arm Left     for dialysis x 2  . Insertion of dialysis catheter      Rt chest       History of present illness and  Hospital Course:     Kindly see H&P for history of present illness and admission details, please review complete Labs, Consult reports and Test reports for all details in brief  HPI  from the history and physical done on the day of admission   Carl Cuna Sr. is a 54 y.o. male with of history ESRD on HD ( MWF), HTN, Anemia who presents to the ED with complaints of sudden onset of dizziness and ataxia with nausea around 7 PM. He also had left sided weakness and loss of his peripheral vision in his left  eye. He reports that he bumped the top of his head 2 hours before his symptoms began. He denied havign any LOC. His symptoms persisted so he called EMS, and in the ED he was evaluated as a Code Stroke, and he was not deemed a candidate for TPA since it was unclear of his last known normal time and onset of symptoms. A Ct scan of the head ws performed which was negative for acute findings. He was seen by Neurology Dr. Janann Colonel and referred for medical admission.    Hospital Course    1. ? Small R. parieto-occipital lobe infarct. Also had head injury prior to the episode, currently on aspirin and added statin as LDL above 70, A1c stable, echocardiogram and carotid duplex nonacute. Seen by neurology cleared for discharge. He will follow with neurology outpatient on a close basis. He is completely symptom free.  Doubt this was PRESS - BP was electively stable, however Lopressor low-dose has been added for  even better control.    Recent Labs    Lab Results  Component Value Date   HGBA1C 5.4 05/23/2014       Recent Labs    Lab Results  Component Value Date   CHOL 154 05/23/2014   HDL 53 05/23/2014   LDLCALC 91 05/23/2014   TRIG 50 05/23/2014   CHOLHDL 2.9 05/23/2014       2. ESRD. Monday, Wednesday and Friday dialysis schedule. Renal consulted.   3. Peripheral neuropathy. Continue Neurontin.   4.AOCD - stable   5. 2 episodes of sinus tachycardia in the range of 130s with activity. Completely resolved. No symptoms, no chest pain palpitations or shortness of breath. Each episode lasting few minutes. EKG nonacute. Echocardiogram stable. Placed on low-dose beta blocker, request PCP to check TSH in a week. Requested to follow with cardiology 1 time post discharge.           Discharge Condition: Stable   Follow UP  Follow-up Information    Follow up with DUNHAM,CYNTHIA B, MD. Schedule an appointment as soon as possible for a visit  in 1 week.   Specialty:  Nephrology   Contact information:   [place] [address] [phone]       Follow up with Ascencion Dike, MD. Schedule an appointment as soon as possible for a visit in 1 week.   Specialty:  Otolaryngology   Why:  Left Parotid lesion   Contact information:   [address] [place] [address] [phone]       Follow up with [facility]. Schedule an appointment as soon as possible for a visit in 1 week.   Why:  CVA/PRESS   Contact information:   [address] [place] [phone]      Follow up with [facility]. Schedule an appointment as soon as possible for a visit in 1 week.   Why:  Sinus Tachycardia   Contact information:   [place] [phone]       Follow up with [facility]    . Schedule an appointment as soon as possible for a visit in 1 week.   Contact information:   [address] [place] [phone]        Discharge Instructions  and  Discharge Medications      Discharge Instructions    Discharge instructions    Complete by:  As directed   Follow with Primary MD DUNHAM,CYNTHIA B, MD in 7 days   Get CBC, CMP, 2 view Chest X ray checked  by Primary MD next visit.    Activity: As tolerated with Full fall precautions use walker/cane & assistance as needed   Disposition Home     Diet: Renal - Check your Weight same time everyday, if you gain over 2 pounds, or you develop in leg swelling, experience more shortness of breath or chest pain, call your Primary MD immediately. Follow Cardiac Low Salt Diet and 1.2 lit/day fluid restriction.   On your next visit with your primary care physician please Get Medicines reviewed and adjusted.   Please request your Prim.MD to go over all Hospital Tests and Procedure/Radiological results at the  follow up, please get all Hospital records sent to your Prim MD by signing hospital release before you go home.   If you experience worsening of your admission symptoms,  develop shortness of breath, life threatening emergency, suicidal or homicidal thoughts you must seek medical attention immediately by calling 911 or calling your MD immediately  if symptoms less severe.  You Must read complete instructions/literature along with all the possible adverse reactions/side effects for all the Medicines you take and that have been prescribed to you. Take any new Medicines after you have completely understood and accpet all the possible adverse reactions/side effects.   Do not drive, operating heavy machinery, perform activities at heights, swimming or participation in water activities or provide baby sitting services if your were admitted for syncope or siezures until you have seen by Primary MD or a Neurologist and advised to do so again.  Do not drive when taking Pain medications.    Do not take more than prescribed Pain, Sleep and Anxiety Medications  Special Instructions: If you have smoked or chewed Tobacco  in the last 2 yrs please stop smoking, stop any regular Alcohol  and or any Recreational drug use.  Wear Seat belts while driving.   Please note  You were cared for by a hospitalist during your hospital stay. If you have any questions about your discharge medications or the care you received while you were in the hospital after you are discharged, you can call the unit and asked to speak with the hospitalist on call if the hospitalist that took care of you is not available. Once you are discharged, your primary care physician will handle any further medical issues. Please note that NO REFILLS for any discharge medications will be authorized once you are discharged, as it is imperative that you return to your primary care physician (or establish a relationship with a primary care physician if  you do not have one) for your aftercare needs so that they can reassess your need for medications and monitor your lab values.     Increase activity slowly    Complete by:  As directed             Medication List    STOP taking these medications        ciprofloxacin 500 MG tablet  Commonly known as:  CIPRO     metroNIDAZOLE 500 MG tablet  Commonly known as:  FLAGYL     oxyCODONE-acetaminophen 5-325 MG per tablet  Commonly known as:  PERCOCET     promethazine 25 MG tablet  Commonly known as:  PHENERGAN     traMADol 50 MG tablet  Commonly known as:  ULTRAM      TAKE these medications        aspirin 325 MG tablet  Take 1 tablet (325 mg total) by mouth daily.     atorvastatin 10 MG tablet  Commonly known as:  LIPITOR  Take 1 tablet (10 mg total) by mouth daily at 6 PM.     calcium acetate 667 MG capsule  Commonly known as:  PHOSLO  Take 3,335 mg by mouth 3 (three) times daily with meals.     cinacalcet 60 MG tablet  Commonly known as:  SENSIPAR  Take 120 mg by mouth daily.     gabapentin 300 MG capsule  Commonly known as:  NEURONTIN  Take 300 mg by mouth daily.     metoprolol tartrate 25 MG tablet  Commonly known as:  LOPRESSOR  Take 1 tablet (25 mg total) by mouth 2 (two) times daily.     MULTI VITAMIN MENS PO  Take 1 tablet by mouth daily.  multivitamin Tabs tablet  Take 1 tablet by mouth daily.          Diet and Activity recommendation: See Discharge Instructions above   Consults obtained -  Neuro, Renal   Major procedures and Radiology Reports - PLEASE review detailed and final reports for all details, in brief -   TTE  - Left ventricle: The cavity size was normal. Systolic function wasnormal. The estimated ejection fraction was in the range of 55% to 60%. Wall motion was normal; there were no regional wallmotion abnormalities. Doppler parameters are consistent with abnormal left ventricular relaxation (grade 1  diastolicdysfunction). - Pericardium, extracardiac: A trivial pericardial effusion wasidentified.   Vascular Ultrasound Carotid Duplex has been completed. Preliminary findings: Bilateral: 1-39% ICA stenosis. Vertebral artery flow is antegrade.     Ct Head (brain) Wo Contrast  05/22/2014   CLINICAL DATA:  Code stroke. Dizziness beginning at 2030 hours. Left-sided weakness. No peripheral vision in the left eye.  EXAM: CT HEAD WITHOUT CONTRAST  TECHNIQUE: Contiguous axial images were obtained from the base of the skull through the vertex without intravenous contrast.  COMPARISON:  MRI brain 12/20/2011. CT angio head 12/18/2011. CT head 05/17/2011.  FINDINGS: There is extensive dural thickening and calcification along the falx and tentorium bilaterally. This is unchanged since prior study. Ventricles and sulci appear symmetrical. No mass effect or midline shift. No abnormal extra-axial fluid collections. Focal low-attenuation in the left caudate lobe probably represents old lacunar infarct. No ventricular dilatation. Gray-white matter junctions are distinct. Basal cisterns are not effaced. No acute intracranial hemorrhage. Mucosal thickening in the paranasal sinuses. Mastoid air cells are not opacified. Vascular calcifications. Calvarium appears intact. No significant change since prior study.  IMPRESSION: Prominent dural calcification along the falx and tentorium again demonstrated. 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 No acute intracranial abnormalities. No significant change since previous study.  These results were called by telephone at the time of interpretation on 05/22/2014 at 11:26 pm to Dr. Janann Colonel, who verbally acknowledged these results.   Electronically Signed   By: Lucienne Capers M.D.   On: 05/22/2014 23:32   Mr Brain Wo Contrast  05/24/2014   ADDENDUM REPORT: 05/24/2014 19:22  ADDENDUM: Prior exams have become available.  FLAIR parietal occipital abnormality greater on right is  new compared to prior MR. This may represent posterior reversible encephalopathy syndrome. Please see a initial MR dictation  The superior sagittal sinus appears narrowed by dural thickening. Degree of narrowing has progressed since the 2013 MR. This could be further assessed by MR venography if clinically desired  Abnormal vessel extending from the left internal cerebral vein to the superior margin left internal auditory canal is unchanged possibly a dural fistula or vascular malformation.  Opacification of the left petrous apex unchanged.  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  Nonspecific left parotid lesion has increased minimally in size since prior exam.  Bone marrow changes have progressed slightly.   Electronically Signed   By: Genia Del M.D.   On: 05/24/2014 19:22   05/24/2014   CLINICAL DATA:  54 year old hypertensive male with end-stage renal disease on dialysis presenting with sudden onset of dizziness, ataxia and nausea. Left-sided weakness with peripheral visual loss left eye. Subsequent encounter.  EXAM: MRI HEAD WITHOUT CONTRAST  MRA HEAD WITHOUT CONTRAST  TECHNIQUE: Multiplanar, multiecho pulse sequences of the brain and surrounding structures were obtained without intravenous contrast. Angiographic images of the head were obtained using MRA technique without contrast.  COMPARISON:  No comparison exams  available for direct review at the current time.  FINDINGS: MRI HEAD FINDINGS  This exam was interpreted during a PACS downtime with limited availability of comparison cases. It has been flagged for review following the downtime. If clinically indicated after this review, an addendum will be issued providing details about comparison to prior imaging.  Diffuse dural calcifications/dural thickening.  Parietal-occipital FLAIR abnormality greater on the right suspicious for posterior reversible encephalopathy syndrome. Other potential causes for this appearance include that secondary to; artifact,  meningitis, subarachnoid hemorrhage or result of external supplemental oxygen.  Adequate flow within the superior sagittal sinus cannot be confirmed. This appearance may be chronic and related to the adjacent dural thickening however, sagittal sinus thrombosis could contribute to the parietal occipital FLAIR abnormality.  Suggestion of tiny acute posterior left corona radiata infarct.  Subtle restricted motion medial aspect of the right parietal -occipital lobe may represent result of posterior reversible encephalopathy syndrome or small infarct.  Subtle diffusion abnormality within the mid to lower pons may reflect artifact rather than acute infarct.  Abnormal vessel extends from the left internal cerebral vein level along the left ambient cistern to the superior margin of the left internal auditory canal. Question small dural fistula.  Opacification left petrous apex.  Abnormal bone marrow may represent anemia of renal disease.  Partially empty slightly expanded sella without other findings of pseudotumor cerebri. Mild exophthalmos.  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  Polypoid opacification inferior aspect left maxillary sinus. Minimal mucosal thickening ethmoid sinus air cells.  Left parotid 1.4 x 1 centimeter nonspecific lesion incompletely assessed on present exam.  MRA HEAD FINDINGS  Exam is motion degraded.  Mild narrowing and irregularity right internal carotid artery cavernous segment without high-grade stenosis.  Prominent left posterior communicating artery origin infundibulum without discrete aneurysm.  Hypoplastic A1 segment right anterior cerebral artery. Azygous configuration A2 segment anterior cerebral artery.  Middle cerebral artery mild to moderate branch vessel narrowing and irregularity.  Left vertebral artery is dominant. Non visualized left posterior inferior cerebellar artery.  Mild narrowing distal right vertebral artery.  Nonvisualized right anterior inferior cerebellar artery.  No significant  stenosis basilar artery.  Posterior cerebral artery branch vessel irregularity.  Irregularity narrowing superior cerebellar artery more notable on the right.  IMPRESSION: MRI HEAD  This exam was interpreted during a PACS downtime with limited availability of comparison cases. It has been flagged for review following the downtime. If clinically indicated after this review, an addendum will be issued providing details about comparison to prior imaging.  Diffuse dural calcifications/dural thickening.  Parietal-occipital FLAIR abnormality greater on the right suspicious for posterior reversible encephalopathy syndrome. Other potential causes for this appearance include that secondary to; artifact, meningitis, subarachnoid hemorrhage or result of external supplemental oxygen.  Adequate flow within the superior sagittal sinus cannot be confirmed. This appearance may be chronic and related to the adjacent dural thickening however, sagittal sinus thrombosis could contribute to the parietal occipital FLAIR abnormality.  Suggestion of tiny acute posterior left corona radiata infarct.  Subtle restricted motion medial aspect of the right parietal -occipital lobe may represent result of posterior reversible encephalopathy syndrome or small infarct.  Abnormal vessel extends from the left internal cerebral vein level along the left ambient cistern to the superior margin of the left internal auditory canal. Question small dural fistula.  Opacification left petrous apex.  Abnormal bone marrow may represent anemia of renal disease.  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  Left parotid 1.4 x 1 centimeter nonspecific lesion incompletely assessed on present  exam.  MRA HEAD  Exam is motion degraded.  Mild narrowing and irregularity right internal carotid artery cavernous segment without high-grade stenosis.  Prominent left posterior communicating artery origin infundibulum without discrete aneurysm.  Hypoplastic A1 segment right anterior cerebral  artery. Azygous configuration A2 segment anterior cerebral artery.  Middle cerebral artery mild to moderate branch vessel narrowing and irregularity.  Non visualized left posterior inferior cerebellar artery.  Mild narrowing distal right vertebral artery.  Nonvisualized right anterior inferior cerebellar artery.  Posterior cerebral artery branch vessel irregularity.  Irregularity and narrowing superior cerebellar artery more notable on the right.  Electronically Signed: By: Genia Del M.D. On: 05/23/2014 08:34   Mr Jodene Nam Head/brain Wo Cm  05/24/2014   ADDENDUM REPORT: 05/24/2014 19:22  ADDENDUM: Prior exams have become available.  FLAIR parietal occipital abnormality greater on right is new compared to prior MR. This may represent posterior reversible encephalopathy syndrome. Please see a initial MR dictation  The superior sagittal sinus appears narrowed by dural thickening. Degree of narrowing has progressed since the 2013 MR. This could be further assessed by MR venography if clinically desired  Abnormal vessel extending from the left internal cerebral vein to the superior margin left internal auditory canal is unchanged possibly a dural fistula or vascular malformation.  Opacification of the left petrous apex unchanged.  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  Nonspecific left parotid lesion has increased minimally in size since prior exam.  Bone marrow changes have progressed slightly.   Electronically Signed   By: Genia Del M.D.   On: 05/24/2014 19:22   05/24/2014   CLINICAL DATA:  54 year old hypertensive male with end-stage renal disease on dialysis presenting with sudden onset of dizziness, ataxia and nausea. Left-sided weakness with peripheral visual loss left eye. Subsequent encounter.  EXAM: MRI HEAD WITHOUT CONTRAST  MRA HEAD WITHOUT CONTRAST  TECHNIQUE: Multiplanar, multiecho pulse sequences of the brain and surrounding structures were obtained without intravenous contrast. Angiographic images of  the head were obtained using MRA technique without contrast.  COMPARISON:  No comparison exams available for direct review at the current time.  FINDINGS: MRI HEAD FINDINGS  This exam was interpreted during a PACS downtime with limited availability of comparison cases. It has been flagged for review following the downtime. If clinically indicated after this review, an addendum will be issued providing details about comparison to prior imaging.  Diffuse dural calcifications/dural thickening.  Parietal-occipital FLAIR abnormality greater on the right suspicious for posterior reversible encephalopathy syndrome. Other potential causes for this appearance include that secondary to; artifact, meningitis, subarachnoid hemorrhage or result of external supplemental oxygen.  Adequate flow within the superior sagittal sinus cannot be confirmed. This appearance may be chronic and related to the adjacent dural thickening however, sagittal sinus thrombosis could contribute to the parietal occipital FLAIR abnormality.  Suggestion of tiny acute posterior left corona radiata infarct.  Subtle restricted motion medial aspect of the right parietal -occipital lobe may represent result of posterior reversible encephalopathy syndrome or small infarct.  Subtle diffusion abnormality within the mid to lower pons may reflect artifact rather than acute infarct.  Abnormal vessel extends from the left internal cerebral vein level along the left ambient cistern to the superior margin of the left internal auditory canal. Question small dural fistula.  Opacification left petrous apex.  Abnormal bone marrow may represent anemia of renal disease.  Partially empty slightly expanded sella without other findings of pseudotumor cerebri. Mild exophthalmos.  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  Polypoid opacification inferior aspect left  maxillary sinus. Minimal mucosal thickening ethmoid sinus air cells.  Left parotid 1.4 x 1 centimeter nonspecific lesion  incompletely assessed on present exam.  MRA HEAD FINDINGS  Exam is motion degraded.  Mild narrowing and irregularity right internal carotid artery cavernous segment without high-grade stenosis.  Prominent left posterior communicating artery origin infundibulum without discrete aneurysm.  Hypoplastic A1 segment right anterior cerebral artery. Azygous configuration A2 segment anterior cerebral artery.  Middle cerebral artery mild to moderate branch vessel narrowing and irregularity.  Left vertebral artery is dominant. Non visualized left posterior inferior cerebellar artery.  Mild narrowing distal right vertebral artery.  Nonvisualized right anterior inferior cerebellar artery.  No significant stenosis basilar artery.  Posterior cerebral artery branch vessel irregularity.  Irregularity narrowing superior cerebellar artery more notable on the right.  IMPRESSION: MRI HEAD  This exam was interpreted during a PACS downtime with limited availability of comparison cases. It has been flagged for review following the downtime. If clinically indicated after this review, an addendum will be issued providing details about comparison to prior imaging.  Diffuse dural calcifications/dural thickening.  Parietal-occipital FLAIR abnormality greater on the right suspicious for posterior reversible encephalopathy syndrome. Other potential causes for this appearance include that secondary to; artifact, meningitis, subarachnoid hemorrhage or result of external supplemental oxygen.  Adequate flow within the superior sagittal sinus cannot be confirmed. This appearance may be chronic and related to the adjacent dural thickening however, sagittal sinus thrombosis could contribute to the parietal occipital FLAIR abnormality.  Suggestion of tiny acute posterior left corona radiata infarct.  Subtle restricted motion medial aspect of the right parietal -occipital lobe may represent result of posterior reversible encephalopathy syndrome or small  infarct.  Abnormal vessel extends from the left internal cerebral vein level along the left ambient cistern to the superior margin of the left internal auditory canal. Question small dural fistula.  Opacification left petrous apex.  Abnormal bone marrow may represent anemia of renal disease.  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  Left parotid 1.4 x 1 centimeter nonspecific lesion incompletely assessed on present exam.  MRA HEAD  Exam is motion degraded.  Mild narrowing and irregularity right internal carotid artery cavernous segment without high-grade stenosis.  Prominent left posterior communicating artery origin infundibulum without discrete aneurysm.  Hypoplastic A1 segment right anterior cerebral artery. Azygous configuration A2 segment anterior cerebral artery.  Middle cerebral artery mild to moderate branch vessel narrowing and irregularity.  Non visualized left posterior inferior cerebellar artery.  Mild narrowing distal right vertebral artery.  Nonvisualized right anterior inferior cerebellar artery.  Posterior cerebral artery branch vessel irregularity.  Irregularity and narrowing superior cerebellar artery more notable on the right.  Electronically Signed: By: Genia Del M.D. On: 05/23/2014 08:34    Micro Results      No results found for this or any previous visit (from the past 240 hour(s)).     Today   Subjective:   [name] today has no headache,no chest abdominal pain,no new weakness tingling or numbness, feels much better wants to go home today.    Objective:   Blood pressure 116/85, pulse 73, temperature 98.1 F (36.7 C), temperature source Oral, resp. rate 18, height 6\' 2"  (1.88 m), weight 108.1 kg (238 lb 5.1 oz), SpO2 98 %.   Intake/Output Summary (Last 24 hours) at 05/25/14 1003 Last data filed at 05/25/14 0553  Gross per 24 hour  Intake    360 ml  Output   5000 ml  Net  -4640 ml  Exam Awake Alert, Oriented x 3, No new F.N deficits, Normal  affect [place].AT,PERRAL Supple Neck,No JVD, No cervical lymphadenopathy appriciated.  Symmetrical Chest wall movement, Good air movement bilaterally, CTAB RRR,No Gallops,Rubs or new Murmurs, No Parasternal Heave +ve B.Sounds, Abd Soft, Non tender, No organomegaly appriciated, No rebound -guarding or rigidity. No Cyanosis, Clubbing or edema, No new Rash or bruise  Data Review   CBC w Diff: Lab Results  Component Value Date   WBC 6.4 05/24/2014   HGB 11.0* 05/24/2014   HCT 34.3* 05/24/2014   PLT 240 05/24/2014   LYMPHOPCT 14 05/22/2014   MONOPCT 8 05/22/2014   EOSPCT 5 05/22/2014   BASOPCT 1 05/22/2014    CMP: Lab Results  Component Value Date   NA 135 05/24/2014   K 4.9 05/24/2014   CL 98 05/24/2014   CO2 26 05/24/2014   BUN 65* 05/24/2014   CREATININE 13.33* 05/24/2014   CREATININE 11.39* 12/09/2012   PROT 8.0 05/22/2014   ALBUMIN 2.6* 05/24/2014   BILITOT 0.5 05/22/2014   ALKPHOS 76 05/22/2014   AST 20 05/22/2014   ALT 13 05/22/2014  . Lab Results  Component Value Date   CHOL 154 05/23/2014   HDL 53 05/23/2014   LDLCALC 91 05/23/2014   TRIG 50 05/23/2014   CHOLHDL 2.9 05/23/2014    Lab Results  Component Value Date   HGBA1C 5.4 05/23/2014     Total Time in preparing paper work, data evaluation and todays exam - 35 minutes  Thurnell Lose M.D on 05/25/2014 at 10:03 AM  [facility]   Office  [phone]

## 2014-05-25 NOTE — Progress Notes (Signed)
[facility] ROUNDING NOTE   Subjective:   Interval History: no complaints  Objective:  Vital signs in last 24 hours:  Temp:  [98.1 F (36.7 C)-99.1 F (37.3 C)] 98.6 F (37 C) (04/12 1000) Pulse Rate:  [65-91] 87 (04/12 1016) Resp:  [14-20] 18 (04/12 1000) BP: (108-147)/(69-87) 111/75 mmHg (04/12 1016) SpO2:  [10 %-99 %] 98 % (04/12 0545) Weight:  [108.1 kg (238 lb 5.1 oz)] 108.1 kg (238 lb 5.1 oz) (04/11 1323)  Weight change: -2.3 kg (-5 lb 1.1 oz) Filed Weights   05/23/14 2113 05/24/14 0843 05/24/14 1323  Weight: 115.8 kg (255 lb 4.7 oz) 113.5 kg (250 lb 3.6 oz) 108.1 kg (238 lb 5.1 oz)    Intake/Output: I/O last 3 completed shifts: In: 360 [P.O.:360] Out: 5000 [Other:5000]   Intake/Output this shift:     CVS- RRR RS- CTA ABD- BS present soft non-distended EXT- no edema   Basic Metabolic Panel:  Recent Labs Lab 05/22/14 2335 05/24/14 0658  NA 137 135  K 4.6 4.9  CL 95* 98  CO2 26 26  GLUCOSE 86 81  BUN 49* 65*  CREATININE 10.71* 13.33*  CALCIUM 8.7 8.7  PHOS  --  5.7*    Liver Function Tests:  Recent Labs Lab 05/22/14 2335 05/24/14 0658  AST 20  --   ALT 13  --   ALKPHOS 76  --   BILITOT 0.5  --   PROT 8.0  --   ALBUMIN 3.1* 2.6*   No results for input(s): LIPASE, AMYLASE in the last 168 hours. No results for input(s): AMMONIA in the last 168 hours.  CBC:  Recent Labs Lab 05/22/14 2335 05/24/14 0658  WBC 9.6 6.4  NEUTROABS 7.0  --   HGB 11.6* 11.0*  HCT 36.8* 34.3*  MCV 92.0 89.3  PLT 251 240    Cardiac Enzymes: No results for input(s): CKTOTAL, CKMB, CKMBINDEX, TROPONINI in the last 168 hours.  BNP: Invalid input(s): POCBNP  CBG:  Recent Labs Lab 05/22/14 2321 05/24/14 1539  GLUCAP 97 81    Microbiology: Results for orders placed or performed during the hospital encounter of 03/25/09  Anaerobic culture     Status: None   Collection Time: 03/25/09  3:00 PM  Result Value Ref Range Status   Specimen Description WOUND GRAFT ARM LEFT  Final   Special Requests PATIENT ON FOLLOWING ANCEF  Final   Gram Stain   Final    RARE WBC PRESENT, PREDOMINANTLY PMN NO SQUAMOUS EPITHELIAL CELLS SEEN NO ORGANISMS SEEN   Culture NO ANAEROBES ISOLATED  Final   Report Status 03/30/2009 FINAL  Final  Wound culture     Status: None   Collection Time: 03/25/09  3:00 PM  Result Value Ref Range Status   Specimen Description WOUND GRAFT ARM LEFT  Final   Special Requests PATIENT ON FOLLOWING ANCEF  Final   Gram Stain   Final    RARE WBC PRESENT, PREDOMINANTLY PMN NO SQUAMOUS EPITHELIAL CELLS SEEN NO ORGANISMS SEEN   Culture NO GROWTH 2 DAYS  Final   Report Status 03/28/2009 FINAL  Final    Coagulation Studies:  Recent Labs  05/22/14 2335  LABPROT 13.6  INR 1.03    Urinalysis: No results for input(s): COLORURINE, LABSPEC, PHURINE, GLUCOSEU, HGBUR, BILIRUBINUR, KETONESUR, PROTEINUR, UROBILINOGEN, NITRITE, LEUKOCYTESUR in the last 72 hours.  Invalid input(s): APPERANCEUR    Imaging: No results found.   Medications:     . alteplase  90 mg Intravenous Once  .  aspirin  325 mg Oral Daily  . atorvastatin  10 mg Oral q1800  . calcium acetate  3,335 mg Oral TID WC  . cinacalcet  120 mg Oral Q breakfast  . gabapentin  300 mg Oral Daily  . heparin  5,000 Units Subcutaneous 3 times per day  . metoprolol tartrate  25 mg Oral BID  . multivitamin  1 tablet Oral Daily   acetaminophen, calcium carbonate, HYDROmorphone (DILAUDID) injection, senna-docusate  Assessment/ Plan:  1. Dizziness -  Resolved appreciate neurology assistance 2. ESRD - HD on MWF @ [facility],  3. Hypertension/volume - controlled 4. Anemia - Hgb 11.6, no meds required. 5. Metabolic bone disease - Ca 8.7 (9.4 corrected); Sensipar 120 mg qd, Phoslo 5 with meals. 6. Nutrition - Alb 3.1, renal diet, vitamin.  Patient possible discharge today    LOS: 2 Erhard Senske W @TODAY @10 :47 AM

## 2014-05-25 NOTE — Discharge Instructions (Signed)
Follow with Primary MD DUNHAM,CYNTHIA B, MD in 7 days   Get CBC, TSH,  CMP, 2 view Chest X ray checked  by Primary MD next visit.    Activity: As tolerated with Full fall precautions use walker/cane & assistance as needed   Disposition Home     Diet: Renal - Check your Weight same time everyday, if you gain over 2 pounds, or you develop in leg swelling, experience more shortness of breath or chest pain, call your Primary MD immediately. Follow Cardiac Low Salt Diet and 1.2 lit/day fluid restriction.   On your next visit with your primary care physician please Get Medicines reviewed and adjusted.   Please request your Prim.MD to go over all Hospital Tests and Procedure/Radiological results at the follow up, please get all Hospital records sent to your Prim MD by signing hospital release before you go home.   If you experience worsening of your admission symptoms, develop shortness of breath, life threatening emergency, suicidal or homicidal thoughts you must seek medical attention immediately by calling 911 or calling your MD immediately  if symptoms less severe.  You Must read complete instructions/literature along with all the possible adverse reactions/side effects for all the Medicines you take and that have been prescribed to you. Take any new Medicines after you have completely understood and accpet all the possible adverse reactions/side effects.   Do not drive, operating heavy machinery, perform activities at heights, swimming or participation in water activities or provide baby sitting services if your were admitted for syncope or siezures until you have seen by Primary MD or a Neurologist and advised to do so again.  Do not drive when taking Pain medications.    Do not take more than prescribed Pain, Sleep and Anxiety Medications  Special Instructions: If you have smoked or chewed Tobacco  in the last 2 yrs please stop smoking, stop any regular Alcohol  and or any Recreational  drug use.  Wear Seat belts while driving.   Please note  You were cared for by a hospitalist during your hospital stay. If you have any questions about your discharge medications or the care you received while you were in the hospital after you are discharged, you can call the unit and asked to speak with the hospitalist on call if the hospitalist that took care of you is not available. Once you are discharged, your primary care physician will handle any further medical issues. Please note that NO REFILLS for any discharge medications will be authorized once you are discharged, as it is imperative that you return to your primary care physician (or establish a relationship with a primary care physician if you do not have one) for your aftercare needs so that they can reassess your need for medications and monitor your lab values.

## 2014-05-26 DIAGNOSIS — E875 Hyperkalemia: Secondary | ICD-10-CM | POA: Diagnosis not present

## 2014-05-26 DIAGNOSIS — N2581 Secondary hyperparathyroidism of renal origin: Secondary | ICD-10-CM | POA: Diagnosis not present

## 2014-05-26 DIAGNOSIS — N186 End stage renal disease: Secondary | ICD-10-CM | POA: Diagnosis not present

## 2014-05-26 DIAGNOSIS — D631 Anemia in chronic kidney disease: Secondary | ICD-10-CM | POA: Diagnosis not present

## 2014-05-26 DIAGNOSIS — D509 Iron deficiency anemia, unspecified: Secondary | ICD-10-CM | POA: Diagnosis not present

## 2014-05-28 DIAGNOSIS — E875 Hyperkalemia: Secondary | ICD-10-CM | POA: Diagnosis not present

## 2014-05-28 DIAGNOSIS — D631 Anemia in chronic kidney disease: Secondary | ICD-10-CM | POA: Diagnosis not present

## 2014-05-28 DIAGNOSIS — D509 Iron deficiency anemia, unspecified: Secondary | ICD-10-CM | POA: Diagnosis not present

## 2014-05-28 DIAGNOSIS — N2581 Secondary hyperparathyroidism of renal origin: Secondary | ICD-10-CM | POA: Diagnosis not present

## 2014-05-28 DIAGNOSIS — N186 End stage renal disease: Secondary | ICD-10-CM | POA: Diagnosis not present

## 2014-05-31 DIAGNOSIS — N186 End stage renal disease: Secondary | ICD-10-CM | POA: Diagnosis not present

## 2014-05-31 DIAGNOSIS — E875 Hyperkalemia: Secondary | ICD-10-CM | POA: Diagnosis not present

## 2014-05-31 DIAGNOSIS — D509 Iron deficiency anemia, unspecified: Secondary | ICD-10-CM | POA: Diagnosis not present

## 2014-05-31 DIAGNOSIS — N2581 Secondary hyperparathyroidism of renal origin: Secondary | ICD-10-CM | POA: Diagnosis not present

## 2014-05-31 DIAGNOSIS — D631 Anemia in chronic kidney disease: Secondary | ICD-10-CM | POA: Diagnosis not present

## 2014-06-02 DIAGNOSIS — N2581 Secondary hyperparathyroidism of renal origin: Secondary | ICD-10-CM | POA: Diagnosis not present

## 2014-06-02 DIAGNOSIS — D631 Anemia in chronic kidney disease: Secondary | ICD-10-CM | POA: Diagnosis not present

## 2014-06-02 DIAGNOSIS — N186 End stage renal disease: Secondary | ICD-10-CM | POA: Diagnosis not present

## 2014-06-02 DIAGNOSIS — E875 Hyperkalemia: Secondary | ICD-10-CM | POA: Diagnosis not present

## 2014-06-02 DIAGNOSIS — D509 Iron deficiency anemia, unspecified: Secondary | ICD-10-CM | POA: Diagnosis not present

## 2014-06-02 DIAGNOSIS — E1129 Type 2 diabetes mellitus with other diabetic kidney complication: Secondary | ICD-10-CM | POA: Diagnosis not present

## 2014-06-04 DIAGNOSIS — N2581 Secondary hyperparathyroidism of renal origin: Secondary | ICD-10-CM | POA: Diagnosis not present

## 2014-06-04 DIAGNOSIS — D509 Iron deficiency anemia, unspecified: Secondary | ICD-10-CM | POA: Diagnosis not present

## 2014-06-04 DIAGNOSIS — D631 Anemia in chronic kidney disease: Secondary | ICD-10-CM | POA: Diagnosis not present

## 2014-06-04 DIAGNOSIS — E875 Hyperkalemia: Secondary | ICD-10-CM | POA: Diagnosis not present

## 2014-06-04 DIAGNOSIS — N186 End stage renal disease: Secondary | ICD-10-CM | POA: Diagnosis not present

## 2014-06-07 DIAGNOSIS — D509 Iron deficiency anemia, unspecified: Secondary | ICD-10-CM | POA: Diagnosis not present

## 2014-06-07 DIAGNOSIS — E875 Hyperkalemia: Secondary | ICD-10-CM | POA: Diagnosis not present

## 2014-06-07 DIAGNOSIS — N2581 Secondary hyperparathyroidism of renal origin: Secondary | ICD-10-CM | POA: Diagnosis not present

## 2014-06-07 DIAGNOSIS — D631 Anemia in chronic kidney disease: Secondary | ICD-10-CM | POA: Diagnosis not present

## 2014-06-07 DIAGNOSIS — N186 End stage renal disease: Secondary | ICD-10-CM | POA: Diagnosis not present

## 2014-06-09 DIAGNOSIS — E875 Hyperkalemia: Secondary | ICD-10-CM | POA: Diagnosis not present

## 2014-06-09 DIAGNOSIS — D509 Iron deficiency anemia, unspecified: Secondary | ICD-10-CM | POA: Diagnosis not present

## 2014-06-09 DIAGNOSIS — N2581 Secondary hyperparathyroidism of renal origin: Secondary | ICD-10-CM | POA: Diagnosis not present

## 2014-06-09 DIAGNOSIS — D631 Anemia in chronic kidney disease: Secondary | ICD-10-CM | POA: Diagnosis not present

## 2014-06-09 DIAGNOSIS — N186 End stage renal disease: Secondary | ICD-10-CM | POA: Diagnosis not present

## 2014-06-11 DIAGNOSIS — N186 End stage renal disease: Secondary | ICD-10-CM | POA: Diagnosis not present

## 2014-06-11 DIAGNOSIS — D509 Iron deficiency anemia, unspecified: Secondary | ICD-10-CM | POA: Diagnosis not present

## 2014-06-11 DIAGNOSIS — E875 Hyperkalemia: Secondary | ICD-10-CM | POA: Diagnosis not present

## 2014-06-11 DIAGNOSIS — N2581 Secondary hyperparathyroidism of renal origin: Secondary | ICD-10-CM | POA: Diagnosis not present

## 2014-06-11 DIAGNOSIS — D631 Anemia in chronic kidney disease: Secondary | ICD-10-CM | POA: Diagnosis not present

## 2014-06-12 DIAGNOSIS — N186 End stage renal disease: Secondary | ICD-10-CM | POA: Diagnosis not present

## 2014-06-12 DIAGNOSIS — I12 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 stage renal disease: Secondary | ICD-10-CM | POA: Diagnosis not present

## 2014-06-12 DIAGNOSIS — Z992 Dependence on renal dialysis: Secondary | ICD-10-CM | POA: Diagnosis not present

## 2014-06-13 DIAGNOSIS — D3703 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: Secondary | ICD-10-CM | POA: Insufficient documentation

## 2014-06-14 DIAGNOSIS — E875 Hyperkalemia: Secondary | ICD-10-CM | POA: Diagnosis not present

## 2014-06-14 DIAGNOSIS — E1129 Type 2 diabetes mellitus with other diabetic kidney complication: Secondary | ICD-10-CM | POA: Diagnosis not present

## 2014-06-14 DIAGNOSIS — N186 End stage renal disease: Secondary | ICD-10-CM | POA: Diagnosis not present

## 2014-06-14 DIAGNOSIS — D509 Iron deficiency anemia, unspecified: Secondary | ICD-10-CM | POA: Diagnosis not present

## 2014-06-14 DIAGNOSIS — N2581 Secondary hyperparathyroidism of renal origin: Secondary | ICD-10-CM | POA: Diagnosis not present

## 2014-06-14 DIAGNOSIS — D631 Anemia in chronic kidney disease: Secondary | ICD-10-CM | POA: Diagnosis not present

## 2014-06-16 DIAGNOSIS — D631 Anemia in chronic kidney disease: Secondary | ICD-10-CM | POA: Diagnosis not present

## 2014-06-16 DIAGNOSIS — E875 Hyperkalemia: Secondary | ICD-10-CM | POA: Diagnosis not present

## 2014-06-16 DIAGNOSIS — N186 End stage renal disease: Secondary | ICD-10-CM | POA: Diagnosis not present

## 2014-06-16 DIAGNOSIS — D509 Iron deficiency anemia, unspecified: Secondary | ICD-10-CM | POA: Diagnosis not present

## 2014-06-16 DIAGNOSIS — N2581 Secondary hyperparathyroidism of renal origin: Secondary | ICD-10-CM | POA: Diagnosis not present

## 2014-06-16 DIAGNOSIS — E1129 Type 2 diabetes mellitus with other diabetic kidney complication: Secondary | ICD-10-CM | POA: Diagnosis not present

## 2014-06-18 DIAGNOSIS — E1129 Type 2 diabetes mellitus with other diabetic kidney complication: Secondary | ICD-10-CM | POA: Diagnosis not present

## 2014-06-18 DIAGNOSIS — D631 Anemia in chronic kidney disease: Secondary | ICD-10-CM | POA: Diagnosis not present

## 2014-06-18 DIAGNOSIS — D509 Iron deficiency anemia, unspecified: Secondary | ICD-10-CM | POA: Diagnosis not present

## 2014-06-18 DIAGNOSIS — N2581 Secondary hyperparathyroidism of renal origin: Secondary | ICD-10-CM | POA: Diagnosis not present

## 2014-06-18 DIAGNOSIS — N186 End stage renal disease: Secondary | ICD-10-CM | POA: Diagnosis not present

## 2014-06-18 DIAGNOSIS — E875 Hyperkalemia: Secondary | ICD-10-CM | POA: Diagnosis not present

## 2014-06-21 DIAGNOSIS — D631 Anemia in chronic kidney disease: Secondary | ICD-10-CM | POA: Diagnosis not present

## 2014-06-21 DIAGNOSIS — D509 Iron deficiency anemia, unspecified: Secondary | ICD-10-CM | POA: Diagnosis not present

## 2014-06-21 DIAGNOSIS — N186 End stage renal disease: Secondary | ICD-10-CM | POA: Diagnosis not present

## 2014-06-21 DIAGNOSIS — N2581 Secondary hyperparathyroidism of renal origin: Secondary | ICD-10-CM | POA: Diagnosis not present

## 2014-06-21 DIAGNOSIS — D3703 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: Secondary | ICD-10-CM | POA: Diagnosis not present

## 2014-06-21 DIAGNOSIS — E1129 Type 2 diabetes mellitus with other diabetic kidney complication: Secondary | ICD-10-CM | POA: Diagnosis not present

## 2014-06-21 DIAGNOSIS — E875 Hyperkalemia: Secondary | ICD-10-CM | POA: Diagnosis not present

## 2014-06-23 DIAGNOSIS — D509 Iron deficiency anemia, unspecified: Secondary | ICD-10-CM | POA: Diagnosis not present

## 2014-06-23 DIAGNOSIS — E1129 Type 2 diabetes mellitus with other diabetic kidney complication: Secondary | ICD-10-CM | POA: Diagnosis not present

## 2014-06-23 DIAGNOSIS — D631 Anemia in chronic kidney disease: Secondary | ICD-10-CM | POA: Diagnosis not present

## 2014-06-23 DIAGNOSIS — N186 End stage renal disease: Secondary | ICD-10-CM | POA: Diagnosis not present

## 2014-06-23 DIAGNOSIS — N2581 Secondary hyperparathyroidism of renal origin: Secondary | ICD-10-CM | POA: Diagnosis not present

## 2014-06-23 DIAGNOSIS — E875 Hyperkalemia: Secondary | ICD-10-CM | POA: Diagnosis not present

## 2014-06-24 ENCOUNTER — Other Ambulatory Visit (INDEPENDENT_AMBULATORY_CARE_PROVIDER_SITE_OTHER): Payer: Self-pay | Admitting: Otolaryngology

## 2014-06-24 DIAGNOSIS — K118 Other diseases of salivary glands: Secondary | ICD-10-CM

## 2014-06-25 ENCOUNTER — Other Ambulatory Visit: Payer: Self-pay | Admitting: Internal Medicine

## 2014-06-25 DIAGNOSIS — E1129 Type 2 diabetes mellitus with other diabetic kidney complication: Secondary | ICD-10-CM | POA: Diagnosis not present

## 2014-06-25 DIAGNOSIS — E875 Hyperkalemia: Secondary | ICD-10-CM | POA: Diagnosis not present

## 2014-06-25 DIAGNOSIS — N186 End stage renal disease: Secondary | ICD-10-CM | POA: Diagnosis not present

## 2014-06-25 DIAGNOSIS — N2581 Secondary hyperparathyroidism of renal origin: Secondary | ICD-10-CM | POA: Diagnosis not present

## 2014-06-25 DIAGNOSIS — D509 Iron deficiency anemia, unspecified: Secondary | ICD-10-CM | POA: Diagnosis not present

## 2014-06-25 DIAGNOSIS — D631 Anemia in chronic kidney disease: Secondary | ICD-10-CM | POA: Diagnosis not present

## 2014-06-28 DIAGNOSIS — N2581 Secondary hyperparathyroidism of renal origin: Secondary | ICD-10-CM | POA: Diagnosis not present

## 2014-06-28 DIAGNOSIS — D631 Anemia in chronic kidney disease: Secondary | ICD-10-CM | POA: Diagnosis not present

## 2014-06-28 DIAGNOSIS — E875 Hyperkalemia: Secondary | ICD-10-CM | POA: Diagnosis not present

## 2014-06-28 DIAGNOSIS — E1129 Type 2 diabetes mellitus with other diabetic kidney complication: Secondary | ICD-10-CM | POA: Diagnosis not present

## 2014-06-28 DIAGNOSIS — D509 Iron deficiency anemia, unspecified: Secondary | ICD-10-CM | POA: Diagnosis not present

## 2014-06-28 DIAGNOSIS — N186 End stage renal disease: Secondary | ICD-10-CM | POA: Diagnosis not present

## 2014-06-29 ENCOUNTER — Other Ambulatory Visit: Payer: Self-pay | Admitting: Physician Assistant

## 2014-06-30 ENCOUNTER — Other Ambulatory Visit (HOSPITAL_COMMUNITY): Payer: Self-pay | Admitting: Radiology

## 2014-06-30 ENCOUNTER — Telehealth (HOSPITAL_COMMUNITY): Payer: Self-pay

## 2014-06-30 ENCOUNTER — Other Ambulatory Visit: Payer: Self-pay | Admitting: Radiology

## 2014-06-30 DIAGNOSIS — N2581 Secondary hyperparathyroidism of renal origin: Secondary | ICD-10-CM | POA: Diagnosis not present

## 2014-06-30 DIAGNOSIS — E875 Hyperkalemia: Secondary | ICD-10-CM | POA: Diagnosis not present

## 2014-06-30 DIAGNOSIS — E1129 Type 2 diabetes mellitus with other diabetic kidney complication: Secondary | ICD-10-CM | POA: Diagnosis not present

## 2014-06-30 DIAGNOSIS — D509 Iron deficiency anemia, unspecified: Secondary | ICD-10-CM | POA: Diagnosis not present

## 2014-06-30 DIAGNOSIS — N186 End stage renal disease: Secondary | ICD-10-CM | POA: Diagnosis not present

## 2014-06-30 DIAGNOSIS — D631 Anemia in chronic kidney disease: Secondary | ICD-10-CM | POA: Diagnosis not present

## 2014-06-30 NOTE — Telephone Encounter (Signed)
Called pt to remind him of US biopsy on 07/01/14 at 10am... Pt agreed to arrive at [facility] at 8:30 and have nothing to eat or drink after midnight the night before. AW

## 2014-07-01 ENCOUNTER — Ambulatory Visit (HOSPITAL_COMMUNITY)
Admission: RE | Admit: 2014-07-01 | Discharge: 2014-07-01 | Disposition: A | Discharge status: Home / Self Care | Payer: Medicare Other | Source: Ambulatory Visit | Attending: Otolaryngology | Admitting: Otolaryngology

## 2014-07-01 DIAGNOSIS — Z992 Dependence on renal dialysis: Secondary | ICD-10-CM | POA: Insufficient documentation

## 2014-07-01 DIAGNOSIS — I12 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 stage renal disease: Secondary | ICD-10-CM | POA: Insufficient documentation

## 2014-07-01 DIAGNOSIS — Z79899 Other long term (current) drug therapy: Secondary | ICD-10-CM | POA: Diagnosis not present

## 2014-07-01 DIAGNOSIS — G4730 Sleep apnea, unspecified: Secondary | ICD-10-CM | POA: Diagnosis not present

## 2014-07-01 DIAGNOSIS — N186 End stage renal disease: Secondary | ICD-10-CM | POA: Insufficient documentation

## 2014-07-01 DIAGNOSIS — R220 Localized swelling, mass and lump, head: Secondary | ICD-10-CM | POA: Diagnosis not present

## 2014-07-01 DIAGNOSIS — K119 Disease of salivary gland, unspecified: Secondary | ICD-10-CM | POA: Insufficient documentation

## 2014-07-01 DIAGNOSIS — Z87891 Personal history of nicotine dependence: Secondary | ICD-10-CM | POA: Diagnosis not present

## 2014-07-01 DIAGNOSIS — Z7982 Long term (current) use of aspirin: Secondary | ICD-10-CM | POA: Insufficient documentation

## 2014-07-01 DIAGNOSIS — K118 Other diseases of salivary glands: Secondary | ICD-10-CM | POA: Insufficient documentation

## 2014-07-01 LAB — CBC WITH DIFFERENTIAL/PLATELET
Basophils Absolute: 0.1 10*3/uL (ref 0.0–0.1)
Basophils Relative: 1 % (ref 0–1)
Eosinophils Absolute: 0.3 10*3/uL (ref 0.0–0.7)
Eosinophils Relative: 5 % (ref 0–5)
HCT: 32.1 % — ABNORMAL LOW (ref 39.0–52.0)
Hemoglobin: 10.5 g/dL — ABNORMAL LOW (ref 13.0–17.0)
Lymphocytes Relative: 12 % (ref 12–46)
Lymphs Abs: 0.8 10*3/uL (ref 0.7–4.0)
MCH: 29.2 pg (ref 26.0–34.0)
MCHC: 32.7 g/dL (ref 30.0–36.0)
MCV: 89.4 fL (ref 78.0–100.0)
Monocytes Absolute: 0.8 10*3/uL (ref 0.1–1.0)
Monocytes Relative: 12 % (ref 3–12)
Neutro Abs: 4.7 10*3/uL (ref 1.7–7.7)
Neutrophils Relative %: 70 % (ref 43–77)
Platelets: 192 10*3/uL (ref 150–400)
RBC: 3.59 MIL/uL — ABNORMAL LOW (ref 4.22–5.81)
RDW: 15.2 % (ref 11.5–15.5)
WBC: 6.7 10*3/uL (ref 4.0–10.5)

## 2014-07-01 LAB — PROTIME-INR
INR: 1.04 (ref 0.00–1.49)
Prothrombin Time: 13.7 seconds (ref 11.6–15.2)

## 2014-07-01 MED ORDER — LIDOCAINE HCL (PF) 1 % IJ SOLN
INTRAMUSCULAR | Status: AC
  Filled 2014-07-01: qty 10

## 2014-07-01 MED ORDER — MIDAZOLAM HCL 2 MG/2ML IJ SOLN
INTRAMUSCULAR | Status: DC
Start: 2014-07-01 — End: 2014-07-01
  Filled 2014-07-01: qty 2

## 2014-07-01 MED ORDER — SODIUM CHLORIDE 0.9 % IV SOLN
INTRAVENOUS | Status: DC
  Administered 2014-07-01: 09:00:00 via INTRAVENOUS

## 2014-07-01 MED ORDER — FENTANYL CITRATE (PF) 100 MCG/2ML IJ SOLN
INTRAMUSCULAR | Status: AC
  Filled 2014-07-01: qty 2

## 2014-07-01 NOTE — Sedation Documentation (Signed)
No sedation given, after MD explained procedure to pt.

## 2014-07-01 NOTE — H&P (Signed)
Chief Complaint: "I'm here for a biopsy"  Referring Physician(s): Teoh,Su  History of Present Illness: Carl Guagliardo Sr. is a 54 y.o. male with history of ESRD, HTN, and recent imaging revealing slight enlargement of a left parotid lesion. He presents today for US guided biopsy of the parotid lesion.   Past Medical History  Diagnosis Date  . Hypertension   . Anemia   . Chronic headaches   . Sleep apnea   . Chronic kidney disease     Chronic Renal Failure; On Renal Transplant List  . Presence of arteriovenous fistula for hemodialysis, primary     RUE  . Hemodialysis patient     M-W-F    Past Surgical History  Procedure Laterality Date  . Av fistula placement Right     right arm  . Graft left arm Left     for dialysis x 2  . Insertion of dialysis catheter      Rt chest    Allergies: Infed; Oxycodone; Vicodin; Diclofenac; and Whole blood  Medications: Prior to Admission medications   Medication Sig Start Date End Date Taking? Authorizing Provider  aspirin 325 MG tablet Take 1 tablet (325 mg total) by mouth daily. 05/25/14  Yes Thurnell Lose, MD  atorvastatin (LIPITOR) 10 MG tablet Take 1 tablet (10 mg total) by mouth daily at 6 PM. 05/25/14  Yes Thurnell Lose, MD  calcium acetate (PHOSLO) 667 MG capsule Take 3,335 mg by mouth 3 (three) times daily with meals.    Yes Historical Provider, MD  cinacalcet (SENSIPAR) 60 MG tablet Take 120 mg by mouth daily.    Yes Historical Provider, MD  gabapentin (NEURONTIN) 300 MG capsule Take 300 mg by mouth daily.    Yes Historical Provider, MD  ibuprofen (ADVIL,MOTRIN) 800 MG tablet Take 800 mg by mouth 2 (two) times daily.   Yes Historical Provider, MD  lanthanum (FOSRENOL) 1000 MG chewable tablet Chew 1,000 mg by mouth 3 (three) times daily with meals.   Yes Historical Provider, MD  Multiple Vitamin (MULTI VITAMIN MENS PO) Take 1 tablet by mouth daily.     Yes Historical Provider, MD  multivitamin (RENA-VIT) TABS tablet  Take 1 tablet by mouth daily.   Yes Historical Provider, MD  sorbitol 70 % solution Take 15-60 mLs by mouth daily as needed (dialysis days).    Yes Historical Provider, MD  metoprolol tartrate (LOPRESSOR) 25 MG tablet Take 1 tablet (25 mg total) by mouth 2 (two) times daily. Patient not taking: Reported on 07/01/2014 05/25/14   Thurnell Lose, MD    Family History  Problem Relation Age of Onset  . Diabetes Father   . Uterine cancer Mother   . Stroke Father   . Hypertension Father   . Lupus Sister   . Stroke Sister   . Anuerysm Brother     brain  . Hypertension Sister     History   Social History  . Marital Status: Single    Spouse Name: N/A  . Number of Children: 2  . Years of Education: N/A   Occupational History  . retired    Social History Main Topics  . Smoking status: Former Smoker    Types: Cigars    Quit date: 06/19/1990  . Smokeless tobacco: Not on file  . Alcohol Use: 0.0 oz/week     Comment: occasional  . Drug Use: No  . Sexual Activity: Not on file   Other Topics Concern  . Not on  file   Social History Narrative    ECOG Status:   Review of Systems  Constitutional: Negative for fever and chills.  Respiratory: Negative for cough and shortness of breath.   Cardiovascular: Negative for chest pain.  Gastrointestinal: Negative for nausea, vomiting and abdominal pain.  Genitourinary: Negative for dysuria and hematuria.  Musculoskeletal: Negative for back pain.  Neurological:       Few recent episodes of dizziness, occ HA's  Hematological: Does not bruise/bleed easily.    Vital Signs: BP 131/78 mmHg  Pulse 87  Temp(Src) 98.4 F (36.9 C)  Resp 18  Ht 6\' 2"  (1.88 m)  Wt 214 lb (97.07 kg)  BMI 27.46 kg/m2  SpO2 100%  Physical Exam  Constitutional: He is oriented to person, place, and time. He appears well-developed and well-nourished.  Cardiovascular: Normal rate and regular rhythm.   RUE AVF with good thrill/bruit  Musculoskeletal: Normal  range of motion. He exhibits no edema.  Neurological: He is alert and oriented to person, place, and time.    Imaging: No results found.  Labs:  CBC:  Recent Labs  12/22/13 1052 05/22/14 2335 05/24/14 0658 07/01/14 0900  WBC 9.9 9.6 6.4 6.7  HGB 12.4* 11.6* 11.0* 10.5*  HCT 37.5* 36.8* 34.3* 32.1*  PLT 306 251 240 192    COAGS:  Recent Labs  05/22/14 2335  INR 1.03  APTT 28    BMP:  Recent Labs  09/03/13 0918 12/22/13 1052 05/22/14 2335 05/24/14 0658  NA 136* 137 137 135  K 4.2 5.3 4.6 4.9  CL 96 91* 95* 98  CO2  --  26 26 26   GLUCOSE 94 89 86 81  BUN 35* 38* 49* 65*  CALCIUM  --  10.4 8.7 8.7  CREATININE 10.70* 9.09* 10.71* 13.33*  GFRNONAA  --  6* 5* 4*  GFRAA  --  7* 5* 4*    LIVER FUNCTION TESTS:  Recent Labs  12/22/13 1052 05/22/14 2335 05/24/14 0658  BILITOT 0.4 0.5  --   AST 23 20  --   ALT 11 13  --   ALKPHOS 70 76  --   PROT 8.8* 8.0  --   ALBUMIN 3.1* 3.1* 2.6*    TUMOR MARKERS: No results for input(s): AFPTM, CEA, CA199, CHROMGRNA in the last 8760 hours.  Assessment and Plan: Carl Heigel Sr. is a 54 y.o. male with history of ESRD, HTN, and recent imaging revealing slight enlargement of a left parotid lesion. He presents today for US guided biopsy of the parotid lesion.Risks and benefits discussed with the patient including, but not limited to bleeding, infection, damage to adjacent structures or low yield requiring additional tests. All of the patient's questions were answered, patient is agreeable to proceed. Consent signed and in chart.     Signed: D. Rowe Robert 07/01/2014, 9:38 AM   I spent a total of 20 minutes face to face in clinical consultation, greater than 50% of which was counseling/coordinating care for US guided left parotid lesion biopsy

## 2014-07-01 NOTE — Procedures (Signed)
[place] FNA L parotid nodule 21g x3 to cyto No complication No blood loss. See complete dictation in [facility].

## 2014-07-02 DIAGNOSIS — D509 Iron deficiency anemia, unspecified: Secondary | ICD-10-CM | POA: Diagnosis not present

## 2014-07-02 DIAGNOSIS — N186 End stage renal disease: Secondary | ICD-10-CM | POA: Diagnosis not present

## 2014-07-02 DIAGNOSIS — E1129 Type 2 diabetes mellitus with other diabetic kidney complication: Secondary | ICD-10-CM | POA: Diagnosis not present

## 2014-07-02 DIAGNOSIS — E875 Hyperkalemia: Secondary | ICD-10-CM | POA: Diagnosis not present

## 2014-07-02 DIAGNOSIS — N2581 Secondary hyperparathyroidism of renal origin: Secondary | ICD-10-CM | POA: Diagnosis not present

## 2014-07-02 DIAGNOSIS — D631 Anemia in chronic kidney disease: Secondary | ICD-10-CM | POA: Diagnosis not present

## 2014-07-05 DIAGNOSIS — N2581 Secondary hyperparathyroidism of renal origin: Secondary | ICD-10-CM | POA: Diagnosis not present

## 2014-07-05 DIAGNOSIS — E1129 Type 2 diabetes mellitus with other diabetic kidney complication: Secondary | ICD-10-CM | POA: Diagnosis not present

## 2014-07-05 DIAGNOSIS — E875 Hyperkalemia: Secondary | ICD-10-CM | POA: Diagnosis not present

## 2014-07-05 DIAGNOSIS — D631 Anemia in chronic kidney disease: Secondary | ICD-10-CM | POA: Diagnosis not present

## 2014-07-05 DIAGNOSIS — D509 Iron deficiency anemia, unspecified: Secondary | ICD-10-CM | POA: Diagnosis not present

## 2014-07-05 DIAGNOSIS — N186 End stage renal disease: Secondary | ICD-10-CM | POA: Diagnosis not present

## 2014-07-06 ENCOUNTER — Encounter: Payer: Self-pay | Admitting: Internal Medicine

## 2014-07-06 ENCOUNTER — Ambulatory Visit (INDEPENDENT_AMBULATORY_CARE_PROVIDER_SITE_OTHER): Payer: Medicare Other | Admitting: Internal Medicine

## 2014-07-06 VITALS — BP 96/74 | HR 98 | Ht 74.0 in | Wt 244.0 lb

## 2014-07-06 DIAGNOSIS — I10 Essential (primary) hypertension: Secondary | ICD-10-CM | POA: Diagnosis not present

## 2014-07-06 DIAGNOSIS — E785 Hyperlipidemia, unspecified: Secondary | ICD-10-CM | POA: Diagnosis not present

## 2014-07-06 DIAGNOSIS — N186 End stage renal disease: Secondary | ICD-10-CM | POA: Diagnosis not present

## 2014-07-06 DIAGNOSIS — Z992 Dependence on renal dialysis: Secondary | ICD-10-CM

## 2014-07-06 DIAGNOSIS — I639 Cerebral infarction, unspecified: Secondary | ICD-10-CM

## 2014-07-06 NOTE — Progress Notes (Signed)
OFFICE NOTE  Chief Complaint:  No complaints  Primary Care Physician: Lucrezia Starch, MD  HPI:  Carl Torrez Sr. is a pleasant 54 year old male with a history of chronic kidney disease who is currently undergoing evaluation for renal transplant. He does have end-stage renal disease on dialysis every Monday, Wednesday and Friday. Recently he was in the hospital for possible stroke however it is unclear whether this actually occurred or whether his imaging findings could be consistent with PRES. he had carotid Dopplers which showed a mild amount of carotid plaque. He was started on low-dose Lipitor based on a fairly favorable cholesterol profile. He does have a history of stress testing several years ago in 2012 which was negative for ischemia. He tells me that he also had a stress test at [facility] last year which was negative as part of his transplant workup. He denies any chest pain or shortness of breath with exertion. He does have low blood pressure at dialysis and is been taken off of blood pressure medications. He tends to run a little bit fast with regards to heart rate was thought this is related to anxiety.  PMHx:  Past Medical History  Diagnosis Date  . Hypertension   . Anemia   . Chronic headaches   . Sleep apnea   . Chronic kidney disease     Chronic Renal Failure; On Renal Transplant List  . Presence of arteriovenous fistula for hemodialysis, primary     RUE  . Hemodialysis patient     M-W-F    Past Surgical History  Procedure Laterality Date  . Av fistula placement Right     right arm  . Graft left arm Left     for dialysis x 2  . Insertion of dialysis catheter      Rt chest    FAMHx:  Family History  Problem Relation Age of Onset  . Diabetes Father   . Stroke Father   . Hypertension Father   . Uterine cancer Mother   . Lupus Sister   . Stroke Sister   . Anuerysm Brother     brain  . Hypertension Sister     SOCHx:   reports that he  quit smoking about 24 years ago. His smoking use included Cigars. He does not have any smokeless tobacco history on file. He reports that he drinks alcohol. He reports that he does not use illicit drugs.  ALLERGIES:  Allergies  Allergen Reactions  . Infed [Iron Dextran] Other (See Comments)    Decreased BP   . Oxycodone Nausea Only  . Vicodin [Hydrocodone-Acetaminophen] Other (See Comments)    Decreased BP  . Diclofenac Other (See Comments)    Unknown  . Whole Blood Other (See Comments)    Unk    ROS: A comprehensive review of systems was negative except for: Cardiovascular: positive for lower extremity edema  HOME MEDS: Current Outpatient Prescriptions  Medication Sig Dispense Refill  . aspirin 325 MG tablet Take 1 tablet (325 mg total) by mouth daily. 30 tablet 0  . atorvastatin (LIPITOR) 10 MG tablet Take 1 tablet (10 mg total) by mouth daily at 6 PM. 30 tablet 0  . calcium acetate (PHOSLO) 667 MG capsule Take 3,335 mg by mouth 3 (three) times daily with meals.     . cinacalcet (SENSIPAR) 60 MG tablet Take 120 mg by mouth daily.     Marland Kitchen gabapentin (NEURONTIN) 300 MG capsule Take 300 mg by mouth daily.     Marland Kitchen  ibuprofen (ADVIL,MOTRIN) 800 MG tablet Take 800 mg by mouth 2 (two) times daily.    Marland Kitchen lanthanum (FOSRENOL) 1000 MG chewable tablet Chew 1,000 mg by mouth 3 (three) times daily with meals.    . metoprolol tartrate (LOPRESSOR) 25 MG tablet Take 1 tablet (25 mg total) by mouth 2 (two) times daily. 60 tablet 0  . Multiple Vitamin (MULTI VITAMIN MENS PO) Take 1 tablet by mouth daily.      . multivitamin (RENA-VIT) TABS tablet Take 1 tablet by mouth daily.    . sorbitol 70 % solution Take 15-60 mLs by mouth daily as needed (dialysis days).      Current Facility-Administered Medications  Medication Dose Route Frequency Provider Last Rate Last Dose  . 0.9 %  sodium chloride infusion  500 mL Intravenous Continuous Inda Castle, MD        LABS/IMAGING: No results found for this  or any previous visit (from the past 48 hour(s)). No results found.  WEIGHTS: Wt Readings from Last 3 Encounters:  07/06/14 244 lb (110.678 kg)  07/01/14 214 lb (97.07 kg)  05/24/14 238 lb 5.1 oz (108.1 kg)    VITALS: BP 96/74 mmHg  Pulse 98  Ht 6\' 2"  (1.88 m)  Wt 244 lb (110.678 kg)  BMI 31.31 kg/m2  EXAM: General appearance: alert and no distress Neck: no carotid bruit and no JVD Lungs: clear to auscultation bilaterally Heart: regular rate and rhythm, S1, S2 normal, no murmur, click, rub or gallop Abdomen: soft, non-tender; bowel sounds normal; no masses,  no organomegaly Extremities: Right forearm fistula, positive thrill Pulses: 2+ and symmetric Skin: Skin color, texture, turgor normal. No rashes or lesions Neurologic: Grossly normal Psych: Pleasant  EKG: Deferred  ASSESSMENT: 1. Recent neurological event-possible stroke, imaging findings may be consistent with PRES 2. End-stage renal disease on HD (M, W, F) 3. Dyslipidemia 4. Mild carotid artery stenosis  PLAN: 1.   Mr. Lampshire has end-stage renal disease and recently had what sounds like a possible TIA or stroke. Imaging finding was not specific. There was concern about the temporal relationship with him hitting his head prior to this event. I wonder if he had some type of concussion and may have had old findings on his MRI or perhaps new changes. I doubt a cardiac source of his stroke. He does have mild carotid artery stenosis and agree with statin therapy. He's had 2 stress tests in the last 5 years, last one being last year at [facility] which was negative for ischemia. He is not describing any anginal symptoms. I do not feel a further cardiac workup is necessary at this time and wished to continue to modify his risk factors. I think it's good that he continues to be evaluated for renal transplant given his younger age.   I'm happy to see him back in 6 months for risk factor modification or sooner as necessary. Thanks  for the kind referral.   Pixie Casino, MD, [facility] Attending Cardiologist [place] 07/06/2014, 1:24 PM

## 2014-07-06 NOTE — Patient Instructions (Signed)
Please take atorvastatin daily as directed.   Your physician wants you to follow-up in: 6 months with Dr. Debara Pickett. You will receive a reminder letter in the mail two months in advance. If you don't receive a letter, please call our office to schedule the follow-up appointment.

## 2014-07-07 ENCOUNTER — Telehealth: Payer: Self-pay | Admitting: Internal Medicine

## 2014-07-07 DIAGNOSIS — D631 Anemia in chronic kidney disease: Secondary | ICD-10-CM | POA: Diagnosis not present

## 2014-07-07 DIAGNOSIS — N186 End stage renal disease: Secondary | ICD-10-CM | POA: Diagnosis not present

## 2014-07-07 DIAGNOSIS — E211 Secondary hyperparathyroidism, not elsewhere classified: Secondary | ICD-10-CM | POA: Diagnosis not present

## 2014-07-07 DIAGNOSIS — D509 Iron deficiency anemia, unspecified: Secondary | ICD-10-CM | POA: Diagnosis not present

## 2014-07-07 NOTE — Telephone Encounter (Signed)
Faxed signed Release to [name] to obtain records per Dr Debara Pickett. lp

## 2014-07-09 DIAGNOSIS — E211 Secondary hyperparathyroidism, not elsewhere classified: Secondary | ICD-10-CM | POA: Diagnosis not present

## 2014-07-09 DIAGNOSIS — N186 End stage renal disease: Secondary | ICD-10-CM | POA: Diagnosis not present

## 2014-07-09 DIAGNOSIS — D631 Anemia in chronic kidney disease: Secondary | ICD-10-CM | POA: Diagnosis not present

## 2014-07-09 DIAGNOSIS — D509 Iron deficiency anemia, unspecified: Secondary | ICD-10-CM | POA: Diagnosis not present

## 2014-07-12 ENCOUNTER — Inpatient Hospital Stay (HOSPITAL_COMMUNITY)
Admission: EM | Admit: 2014-07-12 | Discharge: 2014-07-17 | DRG: 377 | Disposition: A | Discharge status: Home / Self Care | Payer: Medicare Other | Attending: Internal Medicine | Admitting: Internal Medicine

## 2014-07-12 ENCOUNTER — Encounter (HOSPITAL_COMMUNITY): Payer: Self-pay | Admitting: *Deleted

## 2014-07-12 DIAGNOSIS — E669 Obesity, unspecified: Secondary | ICD-10-CM | POA: Diagnosis present

## 2014-07-12 DIAGNOSIS — G4730 Sleep apnea, unspecified: Secondary | ICD-10-CM | POA: Diagnosis present

## 2014-07-12 DIAGNOSIS — Z992 Dependence on renal dialysis: Secondary | ICD-10-CM | POA: Diagnosis not present

## 2014-07-12 DIAGNOSIS — Z888 Allergy status to other drugs, medicaments and biological substances status: Secondary | ICD-10-CM

## 2014-07-12 DIAGNOSIS — Z7682 Awaiting organ transplant status: Secondary | ICD-10-CM | POA: Diagnosis not present

## 2014-07-12 DIAGNOSIS — Z87891 Personal history of nicotine dependence: Secondary | ICD-10-CM

## 2014-07-12 DIAGNOSIS — D631 Anemia in chronic kidney disease: Secondary | ICD-10-CM | POA: Diagnosis not present

## 2014-07-12 DIAGNOSIS — Z7982 Long term (current) use of aspirin: Secondary | ICD-10-CM | POA: Diagnosis not present

## 2014-07-12 DIAGNOSIS — E785 Hyperlipidemia, unspecified: Secondary | ICD-10-CM | POA: Diagnosis present

## 2014-07-12 DIAGNOSIS — D6489 Other specified anemias: Secondary | ICD-10-CM | POA: Diagnosis not present

## 2014-07-12 DIAGNOSIS — Z8673 Personal history of transient ischemic attack (TIA), and cerebral infarction without residual deficits: Secondary | ICD-10-CM | POA: Diagnosis not present

## 2014-07-12 DIAGNOSIS — N2581 Secondary hyperparathyroidism of renal origin: Secondary | ICD-10-CM | POA: Diagnosis present

## 2014-07-12 DIAGNOSIS — Z531 Procedure and treatment not carried out because of patient's decision for reasons of belief and group pressure: Secondary | ICD-10-CM | POA: Diagnosis not present

## 2014-07-12 DIAGNOSIS — I12 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 stage renal disease: Secondary | ICD-10-CM | POA: Diagnosis not present

## 2014-07-12 DIAGNOSIS — N186 End stage renal disease: Secondary | ICD-10-CM | POA: Diagnosis not present

## 2014-07-12 DIAGNOSIS — Z885 Allergy status to narcotic agent status: Secondary | ICD-10-CM | POA: Diagnosis not present

## 2014-07-12 DIAGNOSIS — D509 Iron deficiency anemia, unspecified: Secondary | ICD-10-CM | POA: Diagnosis not present

## 2014-07-12 DIAGNOSIS — E875 Hyperkalemia: Secondary | ICD-10-CM | POA: Diagnosis not present

## 2014-07-12 DIAGNOSIS — E1129 Type 2 diabetes mellitus with other diabetic kidney complication: Secondary | ICD-10-CM | POA: Diagnosis not present

## 2014-07-12 DIAGNOSIS — K921 Melena: Principal | ICD-10-CM | POA: Diagnosis present

## 2014-07-12 DIAGNOSIS — I10 Essential (primary) hypertension: Secondary | ICD-10-CM | POA: Diagnosis present

## 2014-07-12 DIAGNOSIS — Z6830 Body mass index (BMI) 30.0-30.9, adult: Secondary | ICD-10-CM

## 2014-07-12 DIAGNOSIS — D62 Acute posthemorrhagic anemia: Secondary | ICD-10-CM | POA: Insufficient documentation

## 2014-07-12 DIAGNOSIS — K5731 Diverticulosis of large intestine without perforation or abscess with bleeding: Secondary | ICD-10-CM | POA: Diagnosis not present

## 2014-07-12 DIAGNOSIS — K922 Gastrointestinal hemorrhage, unspecified: Secondary | ICD-10-CM | POA: Diagnosis present

## 2014-07-12 DIAGNOSIS — K625 Hemorrhage of anus and rectum: Secondary | ICD-10-CM | POA: Diagnosis present

## 2014-07-12 DIAGNOSIS — K5790 Diverticulosis of intestine, part unspecified, without perforation or abscess without bleeding: Secondary | ICD-10-CM | POA: Diagnosis present

## 2014-07-12 LAB — COMPREHENSIVE METABOLIC PANEL
ALT: 10 U/L — ABNORMAL LOW (ref 17–63)
AST: 14 U/L — ABNORMAL LOW (ref 15–41)
Albumin: 2.9 g/dL — ABNORMAL LOW (ref 3.5–5.0)
Alkaline Phosphatase: 62 U/L (ref 38–126)
Anion gap: 19 — ABNORMAL HIGH (ref 5–15)
BUN: 95 mg/dL — ABNORMAL HIGH (ref 6–20)
CO2: 21 mmol/L — ABNORMAL LOW (ref 22–32)
Calcium: 8.2 mg/dL — ABNORMAL LOW (ref 8.9–10.3)
Chloride: 101 mmol/L (ref 101–111)
Creatinine, Ser: 11.47 mg/dL — ABNORMAL HIGH (ref 0.61–1.24)
GFR calc Af Amer: 5 mL/min — ABNORMAL LOW (ref >60)
GFR calc non Af Amer: 4 mL/min — ABNORMAL LOW (ref >60)
Glucose, Bld: 103 mg/dL — ABNORMAL HIGH (ref 65–99)
Potassium: 5.1 mmol/L (ref 3.5–5.1)
Sodium: 141 mmol/L (ref 135–145)
Total Bilirubin: 0.5 mg/dL (ref 0.3–1.2)
Total Protein: 7.1 g/dL (ref 6.5–8.1)

## 2014-07-12 LAB — CBC
HCT: 19 % — ABNORMAL LOW (ref 39.0–52.0)
HCT: 21.2 % — ABNORMAL LOW (ref 39.0–52.0)
HCT: 20.2 % — ABNORMAL LOW (ref 39.0–52.0)
Hemoglobin: 6.1 g/dL — CL (ref 13.0–17.0)
Hemoglobin: 6.9 g/dL — CL (ref 13.0–17.0)
Hemoglobin: 6.8 g/dL — CL (ref 13.0–17.0)
MCH: 29.2 pg (ref 26.0–34.0)
MCH: 29.1 pg (ref 26.0–34.0)
MCH: 30.2 pg (ref 26.0–34.0)
MCHC: 32.1 g/dL (ref 30.0–36.0)
MCHC: 32.5 g/dL (ref 30.0–36.0)
MCHC: 33.7 g/dL (ref 30.0–36.0)
MCV: 90.9 fL (ref 78.0–100.0)
MCV: 89.5 fL (ref 78.0–100.0)
MCV: 89.8 fL (ref 78.0–100.0)
Platelets: 265 10*3/uL (ref 150–400)
Platelets: 144 10*3/uL — ABNORMAL LOW (ref 150–400)
Platelets: 226 10*3/uL (ref 150–400)
RBC: 2.09 MIL/uL — ABNORMAL LOW (ref 4.22–5.81)
RBC: 2.37 MIL/uL — ABNORMAL LOW (ref 4.22–5.81)
RBC: 2.25 MIL/uL — ABNORMAL LOW (ref 4.22–5.81)
RDW: 15.7 % — ABNORMAL HIGH (ref 11.5–15.5)
RDW: 15.9 % — ABNORMAL HIGH (ref 11.5–15.5)
RDW: 15.6 % — ABNORMAL HIGH (ref 11.5–15.5)
WBC: 12.3 10*3/uL — ABNORMAL HIGH (ref 4.0–10.5)
WBC: 10.6 10*3/uL — ABNORMAL HIGH (ref 4.0–10.5)
WBC: 10.9 10*3/uL — ABNORMAL HIGH (ref 4.0–10.5)

## 2014-07-12 LAB — MRSA PCR SCREENING: MRSA by PCR: NEGATIVE

## 2014-07-12 LAB — GLUCOSE, CAPILLARY: Glucose-Capillary: 88 mg/dL (ref 65–99)

## 2014-07-12 LAB — TYPE AND SCREEN
ABO/RH(D): O POS
Antibody Screen: NEGATIVE

## 2014-07-12 LAB — ABO/RH: ABO/RH(D): O POS

## 2014-07-12 MED ORDER — LANTHANUM CARBONATE 500 MG PO CHEW
1000.0000 mg | CHEWABLE_TABLET | Freq: Three times a day (TID) | ORAL | Status: DC
  Administered 2014-07-12: 1000 mg via ORAL
  Filled 2014-07-12 (×5): qty 2

## 2014-07-12 MED ORDER — PEG-KCL-NACL-NASULF-NA ASC-C 100 G PO SOLR
0.5000 | Freq: Once | ORAL | Status: AC
  Administered 2014-07-12: 100 g via ORAL
  Filled 2014-07-12: qty 1

## 2014-07-12 MED ORDER — CINACALCET HCL 30 MG PO TABS
120.0000 mg | ORAL_TABLET | Freq: Every day | ORAL | Status: DC
  Administered 2014-07-12 – 2014-07-16 (×5): 120 mg via ORAL
  Filled 2014-07-12 (×6): qty 4

## 2014-07-12 MED ORDER — FERUMOXYTOL INJECTION 510 MG/17 ML
510.0000 mg | Freq: Once | INTRAVENOUS | Status: AC
  Administered 2014-07-12: 510 mg via INTRAVENOUS
  Filled 2014-07-12: qty 17

## 2014-07-12 MED ORDER — ATORVASTATIN CALCIUM 10 MG PO TABS
10.0000 mg | ORAL_TABLET | Freq: Every day | ORAL | Status: DC
  Administered 2014-07-12 – 2014-07-16 (×5): 10 mg via ORAL
  Filled 2014-07-12 (×6): qty 1

## 2014-07-12 MED ORDER — PEG-KCL-NACL-NASULF-NA ASC-C 100 G PO SOLR: 1.0000 | Freq: Once | ORAL | Status: DC

## 2014-07-12 MED ORDER — DARBEPOETIN ALFA 60 MCG/0.3ML IJ SOSY
60.0000 ug | PREFILLED_SYRINGE | Freq: Once | INTRAMUSCULAR | Status: AC
  Administered 2014-07-12: 60 ug via SUBCUTANEOUS
  Filled 2014-07-12: qty 0.3

## 2014-07-12 MED ORDER — PEG-KCL-NACL-NASULF-NA ASC-C 100 G PO SOLR: 0.5000 | Freq: Once | ORAL | Status: DC

## 2014-07-12 MED ORDER — CALCIUM ACETATE 667 MG PO CAPS: 3335.0000 mg | ORAL_CAPSULE | Freq: Three times a day (TID) | ORAL | Status: DC

## 2014-07-12 MED ORDER — CALCIUM ACETATE (PHOS BINDER) 667 MG PO CAPS
3335.0000 mg | ORAL_CAPSULE | Freq: Three times a day (TID) | ORAL | Status: DC
  Administered 2014-07-12 – 2014-07-17 (×12): 3335 mg via ORAL
  Filled 2014-07-12 (×17): qty 5

## 2014-07-12 MED ORDER — SODIUM CHLORIDE 0.9 % IJ SOLN
3.0000 mL | Freq: Two times a day (BID) | INTRAMUSCULAR | Status: DC
  Administered 2014-07-12 – 2014-07-14 (×5): 3 mL via INTRAVENOUS

## 2014-07-12 MED ORDER — SODIUM CHLORIDE 0.9 % IV BOLUS (SEPSIS)
250.0000 mL | Freq: Once | INTRAVENOUS | Status: AC
  Administered 2014-07-12: 250 mL via INTRAVENOUS

## 2014-07-12 NOTE — Progress Notes (Signed)
CRITICAL VALUE ALERT  Critical value received:  Hemoglobin 6.9  Date of notification:  07/12/2014   Time of notification:  7:15 PM   Critical value read back:yes  Nurse who received alert:  Donnella Bi, RN  MD notified (1st page):  Dr. Anderson Malta  Time of first page: [id number]  MD notified (2nd page):  Time of second page:  Responding MD: Dr. Anderson Malta  Time MD responded:  [phone]

## 2014-07-12 NOTE — ED Notes (Signed)
GI MD at the bedside.

## 2014-07-12 NOTE — ED Provider Notes (Signed)
CSN: [id number]     Arrival date & time 07/12/14  1001 History   First MD Initiated Contact with Patient 07/12/14 1030     Chief Complaint  Patient presents with  . Rectal Bleeding     (Consider location/radiation/quality/duration/timing/severity/associated sxs/prior Treatment) HPI   Carl Mulroy Sr. is a 54 y.o. male who presents for evaluation of rectal bleeding. He reports rectal bleeding gradual in onset several days ago while he was on a fishing trip. Yesterday, he did not see any blood, but today he passed blood twice. This morning at dialysis his hemoglobin was checked and was low. His dialysis was interrupted and he was told to come here for treatment, he drove his own vehicle. He denies abdominal pain, back pain, headache, weakness or dizziness. He has had prior gastrointestinal bleeding related to diverticular disease. There are no other known modifying factors.   Past Medical History  Diagnosis Date  . Hypertension   . Anemia   . Chronic headaches   . Sleep apnea   . Chronic kidney disease     Chronic Renal Failure; On Renal Transplant List  . Presence of arteriovenous fistula for hemodialysis, primary     RUE  . Hemodialysis patient     M-W-F   Past Surgical History  Procedure Laterality Date  . Av fistula placement Right     right arm  . Graft left arm Left     for dialysis x 2  . Insertion of dialysis catheter      Rt chest   Family History  Problem Relation Age of Onset  . Diabetes Father   . Stroke Father   . Hypertension Father   . Uterine cancer Mother   . Lupus Sister   . Stroke Sister   . Anuerysm Brother     brain  . Hypertension Sister    History  Substance Use Topics  . Smoking status: Former Smoker    Types: Cigars    Quit date: 06/19/1990  . Smokeless tobacco: Not on file  . Alcohol Use: 0.0 oz/week     Comment: occasional    Review of Systems  All other systems reviewed and are negative.     Allergies  Infed; Oxycodone;  Vicodin; Diclofenac; and Whole blood  Home Medications   Prior to Admission medications   Medication Sig Start Date End Date Taking? Authorizing Provider  aspirin 325 MG tablet Take 1 tablet (325 mg total) by mouth daily. 05/25/14   Thurnell Lose, MD  atorvastatin (LIPITOR) 10 MG tablet Take 1 tablet (10 mg total) by mouth daily at 6 PM. 05/25/14   Thurnell Lose, MD  calcium acetate (PHOSLO) 667 MG capsule Take 3,335 mg by mouth 3 (three) times daily with meals.     Historical Provider, MD  cinacalcet (SENSIPAR) 60 MG tablet Take 120 mg by mouth daily.     Historical Provider, MD  gabapentin (NEURONTIN) 300 MG capsule Take 300 mg by mouth daily.     Historical Provider, MD  ibuprofen (ADVIL,MOTRIN) 800 MG tablet Take 800 mg by mouth 2 (two) times daily.    Historical Provider, MD  lanthanum (FOSRENOL) 1000 MG chewable tablet Chew 1,000 mg by mouth 3 (three) times daily with meals.    Historical Provider, MD  metoprolol tartrate (LOPRESSOR) 25 MG tablet Take 1 tablet (25 mg total) by mouth 2 (two) times daily. 05/25/14   Thurnell Lose, MD  Multiple Vitamin (MULTI VITAMIN MENS PO) Take 1 tablet by  mouth daily.      Historical Provider, MD  multivitamin (RENA-VIT) TABS tablet Take 1 tablet by mouth daily.    Historical Provider, MD  sorbitol 70 % solution Take 15-60 mLs by mouth daily as needed (dialysis days).     Historical Provider, MD   BP 100/56 mmHg  Pulse 85  Temp(Src) 98.5 F (36.9 C) (Oral)  Resp 20  Wt 248 lb 10.9 oz (112.8 kg)  SpO2 99% Physical Exam  Constitutional: He is oriented to person, place, and time. He appears well-developed and well-nourished.  HENT:  Head: Normocephalic and atraumatic.  Right Ear: External ear normal.  Left Ear: External ear normal.  Eyes: Conjunctivae and EOM are normal. Pupils are equal, round, and reactive to light.  Neck: Normal range of motion and phonation normal. Neck supple.  Cardiovascular: Normal rate, regular rhythm and normal  heart sounds.   Vascular fistula, right upper arm has normal thrill and is nontender to palpation  Pulmonary/Chest: Effort normal and breath sounds normal. No respiratory distress. He has no rales. He exhibits no bony tenderness.  Abdominal: Soft. There is no tenderness.  Genitourinary:  Annis normal. No visible or palpable hemorrhoids. Bright red blood in rectum without stool, no rectal mass.  Musculoskeletal: Normal range of motion.  Neurological: He is alert and oriented to person, place, and time. No cranial nerve deficit or sensory deficit. He exhibits normal muscle tone. Coordination normal.  Skin: Skin is warm, dry and intact.  Psychiatric: He has a normal mood and affect. His behavior is normal. Judgment and thought content normal.  Nursing note and vitals reviewed.   ED Course  Procedures (including critical care time)  11:07 AM- The patient is a candidate for blood transfusion, but does not want blood transfusion due to religious reasons.  According to his dialysis center, he had a hemoglobin of 8.2 on 07/07/2014   Medications - No data to display  Patient Vitals for the past 24 hrs:  BP Temp Temp src Pulse Resp SpO2 Weight  07/12/14 1045 100/56 mmHg - - 85 - 99 % -  07/12/14 1006 96/69 mmHg 98.5 F (36.9 C) Oral 97 20 100 % 248 lb 10.9 oz (112.8 kg)   11:10 AM-Consult complete with on-call [facility] resident. Patient case explained and discussed. He agrees to admit patient for further evaluation and treatment. Call ended at 11:40   11:13 AM- paged gastroenterology- Dr. Hilarie Fredrickson will evaluate the patient as a consultant- 11:36  11:45 AM Reevaluation with update and discussion. After initial assessment and treatment, an updated evaluation reveals blood pressure has dropped somewhat. Will give IV fluid bolus 250 cc, and reassess. [name]   CRITICAL CARE Performed by: Daleen Bo L Total critical care time: 45 minutes Critical care time was exclusive of separately  billable procedures and treating other patients. Critical care was necessary to treat or prevent imminent or life-threatening deterioration. Critical care was time spent personally by me on the following activities: development of treatment plan with patient and/or surrogate as well as nursing, discussions with consultants, evaluation of patient's response to treatment, examination of patient, obtaining history from patient or surrogate, ordering and performing treatments and interventions, ordering and review of laboratory studies, ordering and review of radiographic studies, pulse oximetry and re-evaluation of patient's condition.  Labs Review Labs Reviewed  CBC - Abnormal; Notable for the following:    WBC 12.3 (*)    RBC 2.09 (*)    Hemoglobin 6.1 (*)    HCT 19.0 (*)  RDW 15.7 (*)    All other components within normal limits  COMPREHENSIVE METABOLIC PANEL  TYPE AND SCREEN    Imaging Review No results found.   EKG Interpretation None      MDM   Final diagnoses:  Acute posthemorrhagic anemia  Rectal bleeding  End stage renal disease    Anemia with rectal bleeding. Illness complicated by end-stage renal disease. He had dialysis today, which was shortened, but he is not in respiratory distress or evident fluid overload. He does not need urgent dialysis today. His anemia is significant, but he does not want transfusion secondary to religious reasons. He had prior rectal bleeding in 2014, which at that time was secondary to non-steroidal anti-inflammatory drugs. The bleeding stabilized with symptomatic treatment. He will require admission for observation, further evaluation and close monitoring.   Nursing Notes Reviewed/ Care Coordinated, and agree without changes. Applicable Imaging Reviewed.  Interpretation of Laboratory Data incorporated into ED treatment  Plan: Admit    Daleen Bo, MD 07/12/14 [phone]

## 2014-07-12 NOTE — ED Notes (Signed)
Pt in c/o rectal bleeding that started this morning, reports his hgb was low at dialysis this morning and they did not complete treatment, denies pain

## 2014-07-12 NOTE — ED Notes (Signed)
Pt was at dialysis and sat for about an hour through treatment. State he normally sits for about 4 hours.

## 2014-07-12 NOTE — H&P (Signed)
Date: 07/12/2014               Patient Name:  Carl Kozik Sr. MRN: [id number]  DOB: [birth year] Age / Sex: 54 y.o., male   PCP: Carl Maes, MD         Medical Service: Internal Medicine Teaching Service         Attending Physician: Dr. Sid Falcon, MD    First Contact: Dr. Randell Patient Pager: [phone]  Second Contact: Dr. Ronnald Ramp Pager: [phone]       After Hours (After 5p/  First Contact Pager: [phone]  weekends / holidays): Second Contact Pager: [phone]   Chief Complaint: blood per rectum  History of Present Illness: Mr. Carl Pietrangelo Sr. is a 54 year old man with history of HTN, chronic anemia, ESRD on HD MWF presenting with blood per rectum. He reports he first noticed bloody stools 1 week ago. The bleeding stopped two days ago, but started again this morning. He reports diarrhea and some lightheadedness. Denies nausea, vomiting, hemetemesis, abdominal pain, melena. He reports he has a history of diverticulosis and hemorrhoids. He reports his last colonoscopy was about a year ago with no significant findings.  Meds: Current Facility-Administered Medications  Medication Dose Route Frequency Provider Last Rate Last Dose  . 0.9 %  sodium chloride infusion  500 mL Intravenous Continuous Inda Castle, MD       Current Outpatient Prescriptions  Medication Sig Dispense Refill  . aspirin 325 MG tablet Take 1 tablet (325 mg total) by mouth daily. 30 tablet 0  . atorvastatin (LIPITOR) 10 MG tablet Take 1 tablet (10 mg total) by mouth daily at 6 PM. 30 tablet 0  . calcium acetate (PHOSLO) 667 MG capsule Take 3,335 mg by mouth 3 (three) times daily with meals.     . cinacalcet (SENSIPAR) 60 MG tablet Take 120 mg by mouth daily.     Marland Kitchen gabapentin (NEURONTIN) 300 MG capsule Take 300 mg by mouth daily.     Marland Kitchen ibuprofen (ADVIL,MOTRIN) 800 MG tablet Take 800 mg by mouth 2 (two) times daily.    Marland Kitchen lanthanum (FOSRENOL) 1000 MG chewable tablet Chew 1,000 mg by mouth 3 (three) times daily with  meals.    . metoprolol tartrate (LOPRESSOR) 25 MG tablet Take 1 tablet (25 mg total) by mouth 2 (two) times daily. 60 tablet 0  . Multiple Vitamin (MULTI VITAMIN MENS PO) Take 1 tablet by mouth daily.      . multivitamin (RENA-VIT) TABS tablet Take 1 tablet by mouth daily.    . sorbitol 70 % solution Take 15-60 mLs by mouth daily as needed (dialysis days).       Allergies: Allergies as of 07/12/2014 - Review Complete 07/12/2014  Allergen Reaction Noted  . Infed [iron dextran] Other (See Comments) 06/19/2010  . Oxycodone Nausea Only 06/19/2010  . Vicodin [hydrocodone-acetaminophen] Other (See Comments) 06/19/2010  . Diclofenac Other (See Comments) 07/01/2014  . Whole blood Other (See Comments) 07/01/2014   Past Medical History  Diagnosis Date  . Hypertension   . Anemia   . Chronic headaches   . Sleep apnea   . Chronic kidney disease     Chronic Renal Failure; On Renal Transplant List  . Presence of arteriovenous fistula for hemodialysis, primary     RUE  . Hemodialysis patient     M-W-F   Past Surgical History  Procedure Laterality Date  . Av fistula placement Right     right arm  . Graft left arm  Left     for dialysis x 2  . Insertion of dialysis catheter      Rt chest   Family History  Problem Relation Age of Onset  . Diabetes Father   . Stroke Father   . Hypertension Father   . Uterine cancer Mother   . Lupus Sister   . Stroke Sister   . Anuerysm Brother     brain  . Hypertension Sister    History   Social History  . Marital Status: Single    Spouse Name: N/A  . Number of Children: 2  . Years of Education: N/A   Occupational History  . retired    Social History Main Topics  . Smoking status: Former Smoker    Types: Cigars    Quit date: 06/19/1990  . Smokeless tobacco: Not on file  . Alcohol Use: 0.0 oz/week     Comment: occasional  . Drug Use: No  . Sexual Activity: Not on file   Other Topics Concern  . Not on file   Social History Narrative     Review of Systems: Constitutional: no fevers/chills Eyes: no vision changes Ears, nose, mouth, throat, and face: no cough Respiratory: no shortness of breath Cardiovascular: no chest pain Gastrointestinal: no nausea/vomiting, no abdominal pain, no constipation, + diarrhea Genitourinary: no dysuria, no hematuria Integument: no rash Hematologic/lymphatic: no bleeding/bruising, + chronic edema Musculoskeletal: no arthralgias, no myalgias Neurological: + chronic paresthesias, + generalized weakness  Physical Exam: Blood pressure 100/56, pulse 85, temperature 98.5 F (36.9 C), temperature source Oral, resp. rate 20, weight 248 lb 10.9 oz (112.8 kg), SpO2 99 %. General Apperance: NAD Head: Normocephalic, atraumatic Eyes: PERRL, EOMI, anicteric sclera Ears: Normal external ear canal Nose: Nares normal, septum midline, mucosa normal Throat: Lips, mucosa and tongue normal  Neck: Supple, trachea midline Back: No tenderness or bony abnormality  Lungs: Clear to auscultation bilaterally. No wheezes, rhonchi or rales. Breathing comfortably Chest Wall: Nontender, no deformity Heart: Regular rate and rhythm, no murmur/rub/gallop Abdomen: Soft, nontender, nondistended, no rebound/guarding Rectal: minimal thin blood tinged mucous Extremities: Normal, atraumatic, warm and well perfused, trace edema Pulses: 2+ throughout Skin: No rashes or lesions Neurologic: Alert and oriented x 3. CNII-XII intact. Normal strength and sensation  Lab results: Basic Metabolic Panel:  Recent Labs  07/12/14 1030  NA 141  K 5.1  CL 101  CO2 21*  GLUCOSE 103*  BUN 95*  CREATININE 11.47*  CALCIUM 8.2*   Liver Function Tests:  Recent Labs  07/12/14 1030  AST 14*  ALT 10*  ALKPHOS 62  BILITOT 0.5  PROT 7.1  ALBUMIN 2.9*   CBC:  Recent Labs  07/12/14 1030  WBC 12.3*  HGB 6.1*  HCT 19.0*  MCV 90.9  PLT 265   Imaging results:  No results found.  Other results: EKG: Normal sinus  rhythm, T-wave inversion in lead III, otherwise no changes compared to previous EKG  Assessment & Plan by Problem: Active Problems:   Rectal bleed   Lower GI bleed  Lower GI Bleed: He reports he has a history of diverticular disease. Differential includes diverticulosis, angiodysplasia, hemorrhoids, inflammatory etiology, mass. Remains hemodynamically stable. GI was consulted by the emergency department. -Bowel prep, but hold on colonoscopy unless further bleeding recurs -Clear liquid diet, nothing by mouth after midnight -Monitor hemoglobin every 8 hours  Acute on chronic anemia: Previous hemoglobin 10.5 on 07/01/2014. Hemoglobin on admission 6.1. Opposed to transfusion 2/2 religion. Discussed with patient risks of acute on  chronic anemia. -IV iron -Aranesp  HTN: Presently normotensive in the ED -Hold home metoprolol 25 mg twice a day  ESRD on HD MWF -Consult renal in a.m. -Continue home calcium acetate 3335 mg 3 times a day, Sensipar 120 mg daily, lanthanum 1000 mg 3 times a day  Hyperlipidemia: Continue home atorvastatin 10 mg daily at bedtime  FEN: Clear liquid diet, nothing by mouth after midnight  VTE ppx: SCDs  Dispo: Disposition is deferred at this time, awaiting improvement of current medical problems. Anticipated discharge in approximately 1-2 day(s).   The patient does have a current PCP Carl Maes, MD) and does not need an [facility] hospital follow-up appointment after discharge.  The patient does not have transportation limitations that hinder transportation to clinic appointments.  Signed: Milagros Loll, MD PGY-1 07/12/2014, 11:42 AM

## 2014-07-12 NOTE — Consult Note (Signed)
Consultation  Referring Provider: EDP, Dr. Eulis Foster Primary Care Physician:  Lucrezia Starch, MD Primary Gastroenterologist:  Dr. Deatra Ina  Reason for Consultation:  Rectal bleeding  HPI: Carl Goldie Sr. is a 54 y.o. male with ESRD on HD who presented to ED this am with rectal bleeding. Patient told at dialysis that hgb was low. Hgb 10.5 on 5/19, now at 6.1. He began having painless rectal bleeding almost a week ago while out of town on a fishing trip. The bleeding stopped for a couple of days but restarted today. No abdominal or rectal pain. No nausea. Takes daily ASA, other than that uses NSAID infrequently.  Despite critically low hgb, patient actually feels okay   Past Medical History  Diagnosis Date  . Hypertension   . Anemia   . Chronic headaches   . Sleep apnea   . Chronic kidney disease     Chronic Renal Failure; On Renal Transplant List  . Presence of arteriovenous fistula for hemodialysis, primary     RUE  . Hemodialysis patient     M-W-F    Past Surgical History  Procedure Laterality Date  . Av fistula placement Right     right arm  . Graft left arm Left     for dialysis x 2  . Insertion of dialysis catheter      Rt chest    Prior to Admission medications   Medication Sig Start Date End Date Taking? Authorizing Provider  aspirin 325 MG tablet Take 1 tablet (325 mg total) by mouth daily. 05/25/14  Yes Thurnell Lose, MD  atorvastatin (LIPITOR) 10 MG tablet Take 1 tablet (10 mg total) by mouth daily at 6 PM. 05/25/14  Yes Thurnell Lose, MD  calcium acetate (PHOSLO) 667 MG capsule Take 3,335 mg by mouth 3 (three) times daily with meals.    Yes Historical Provider, MD  cinacalcet (SENSIPAR) 60 MG tablet Take 120 mg by mouth daily.    Yes Historical Provider, MD  gabapentin (NEURONTIN) 300 MG capsule Take 300 mg by mouth daily.    Yes Historical Provider, MD  ibuprofen (ADVIL,MOTRIN) 800 MG tablet Take 800 mg by mouth 2 (two) times daily.   Yes Historical  Provider, MD  lanthanum (FOSRENOL) 1000 MG chewable tablet Chew 1,000 mg by mouth 3 (three) times daily with meals.   Yes Historical Provider, MD  metoprolol tartrate (LOPRESSOR) 25 MG tablet Take 1 tablet (25 mg total) by mouth 2 (two) times daily. 05/25/14  Yes Thurnell Lose, MD  Multiple Vitamin (MULTI VITAMIN MENS PO) Take 1 tablet by mouth daily.     Yes Historical Provider, MD  sorbitol 70 % solution Take 15-60 mLs by mouth daily as needed (dialysis days).    Yes Historical Provider, MD    Current Facility-Administered Medications  Medication Dose Route Frequency Provider Last Rate Last Dose  . 0.9 %  sodium chloride infusion  500 mL Intravenous Continuous Inda Castle, MD       Current Outpatient Prescriptions  Medication Sig Dispense Refill  . aspirin 325 MG tablet Take 1 tablet (325 mg total) by mouth daily. 30 tablet 0  . atorvastatin (LIPITOR) 10 MG tablet Take 1 tablet (10 mg total) by mouth daily at 6 PM. 30 tablet 0  . calcium acetate (PHOSLO) 667 MG capsule Take 3,335 mg by mouth 3 (three) times daily with meals.     . cinacalcet (SENSIPAR) 60 MG tablet Take 120 mg by mouth daily.     Marland Kitchen  gabapentin (NEURONTIN) 300 MG capsule Take 300 mg by mouth daily.     Marland Kitchen ibuprofen (ADVIL,MOTRIN) 800 MG tablet Take 800 mg by mouth 2 (two) times daily.    Marland Kitchen lanthanum (FOSRENOL) 1000 MG chewable tablet Chew 1,000 mg by mouth 3 (three) times daily with meals.    . metoprolol tartrate (LOPRESSOR) 25 MG tablet Take 1 tablet (25 mg total) by mouth 2 (two) times daily. 60 tablet 0  . Multiple Vitamin (MULTI VITAMIN MENS PO) Take 1 tablet by mouth daily.      . sorbitol 70 % solution Take 15-60 mLs by mouth daily as needed (dialysis days).       Allergies as of 07/12/2014 - Review Complete 07/12/2014  Allergen Reaction Noted  . Infed [iron dextran] Other (See Comments) 06/19/2010  . Oxycodone Nausea Only 06/19/2010  . Vicodin [hydrocodone-acetaminophen] Other (See Comments) 06/19/2010  .  Diclofenac Other (See Comments) 07/01/2014  . Whole blood Other (See Comments) 07/01/2014    Family History  Problem Relation Age of Onset  . Diabetes Father   . Stroke Father   . Hypertension Father   . Uterine cancer Mother   . Lupus Sister   . Stroke Sister   . Anuerysm Brother     brain  . Hypertension Sister     History   Social History  . Marital Status: Single    Spouse Name: N/A  . Number of Children: 2  . Years of Education: N/A   Occupational History  . retired    Social History Main Topics  . Smoking status: Former Smoker    Types: Cigars    Quit date: 06/19/1990  . Smokeless tobacco: Not on file  . Alcohol Use: 0.0 oz/week     Comment: occasional  . Drug Use: No  . Sexual Activity: Not on file   Other Topics Concern  . Not on file   Social History Narrative    Review of Systems: All systems reviewed and negative except where note in HPI  Physical Exam: Vital signs in last 24 hours: Temp:  [98.5 F (36.9 C)] 98.5 F (36.9 C) (05/30 1006) Pulse Rate:  [79-97] 79 (05/30 1150) Resp:  [20] 20 (05/30 1006) BP: (96-102)/(56-69) 102/64 mmHg (05/30 1150) SpO2:  [99 %-100 %] 100 % (05/30 1150) Weight:  [248 lb 10.9 oz (112.8 kg)] 248 lb 10.9 oz (112.8 kg) (05/30 1006)   General:   Alert,  Well-developed, pleasant pleasant male in  NAD Head:  Normocephalic and atraumatic. Eyes:  Sclera clear, no icterus.   Conjunctiva pink. Ears:  Normal auditory acuity. Nose:  No deformity, discharge,  or lesions. Mouth:  No deformity or lesions.   Neck:  Supple; no masses or thyromegaly. Lungs:  Clear throughout to auscultation.   No wheezes, crackles, or rhonchi.  Heart:  Regular rate and rhythm Abdomen:  Soft,nontender, BS active,nonpalp mass or hsm.   Rectal:  Scant thin blood tinged secretions in vault.   Msk:  Symmetrical without gross deformities. . Pulses:  Normal pulses noted. Extremities:  Without clubbing or edema. Neurologic:  Alert and  oriented  x4;  grossly normal neurologically. Skin:  Intact without significant lesions or rashes.. Psych:  Alert and cooperative. Normal mood and affect.    Lab Results:  Recent Labs  07/12/14 1030  WBC 12.3*  HGB 6.1*  HCT 19.0*  PLT 265   Studies/Results: Normal Screening colonoscopy June 2012   MPRESSION / Plan   38. 54 year old male  in ED with painless rectal bleeding and severe anemia - 4 gram drop in hgb in less than 2 weeks. Suspect diverticular hemorrhage. It is a little concerning that he has rebled after no bleeding for a couple of days. If continues to bleed he may need colonoscopy. If heavy bleeding then possibly tagged scan. Supportive care for now. Patient will not take blood secondary to religious beliefs. Internal Medicine MD has explained risks of untreated profound anemia, patient will think things over. He is okay with IV iron this admission. and could take iron supplements upon discharge to help replace iron loss  If patient agreeable with give bowel prep just in case bleeding doesn't cease and we need to proceed with colonoscopy tomorrow.   2. Hx of diverticulitis docmented by CTscan July 2015  3. ESRD, on HD x 6 years   Tye Savoy  07/12/2014, 11:59 AM

## 2014-07-12 NOTE — Progress Notes (Signed)
Utilization Review Completed.Donne Anon T5/30/2016

## 2014-07-13 DIAGNOSIS — K5790 Diverticulosis of intestine, part unspecified, without perforation or abscess without bleeding: Secondary | ICD-10-CM | POA: Diagnosis present

## 2014-07-13 DIAGNOSIS — E785 Hyperlipidemia, unspecified: Secondary | ICD-10-CM

## 2014-07-13 DIAGNOSIS — I12 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 stage renal disease: Secondary | ICD-10-CM | POA: Diagnosis not present

## 2014-07-13 DIAGNOSIS — K922 Gastrointestinal hemorrhage, unspecified: Secondary | ICD-10-CM

## 2014-07-13 DIAGNOSIS — N186 End stage renal disease: Secondary | ICD-10-CM | POA: Diagnosis not present

## 2014-07-13 DIAGNOSIS — Z992 Dependence on renal dialysis: Secondary | ICD-10-CM | POA: Diagnosis not present

## 2014-07-13 DIAGNOSIS — N2581 Secondary hyperparathyroidism of renal origin: Secondary | ICD-10-CM | POA: Diagnosis present

## 2014-07-13 DIAGNOSIS — K625 Hemorrhage of anus and rectum: Secondary | ICD-10-CM

## 2014-07-13 LAB — BASIC METABOLIC PANEL
Anion gap: 22 — ABNORMAL HIGH (ref 5–15)
BUN: 104 mg/dL — ABNORMAL HIGH (ref 6–20)
CO2: 20 mmol/L — ABNORMAL LOW (ref 22–32)
Calcium: 8.4 mg/dL — ABNORMAL LOW (ref 8.9–10.3)
Chloride: 99 mmol/L — ABNORMAL LOW (ref 101–111)
Creatinine, Ser: 14.24 mg/dL — ABNORMAL HIGH (ref 0.61–1.24)
GFR calc Af Amer: 4 mL/min — ABNORMAL LOW (ref >60)
GFR calc non Af Amer: 3 mL/min — ABNORMAL LOW (ref >60)
Glucose, Bld: 92 mg/dL (ref 65–99)
Potassium: 6.2 mmol/L (ref 3.5–5.1)
Sodium: 141 mmol/L (ref 135–145)

## 2014-07-13 MED ORDER — RENA-VITE PO TABS
1.0000 | ORAL_TABLET | Freq: Every day | ORAL | Status: DC
  Administered 2014-07-13 – 2014-07-16 (×4): 1 via ORAL
  Filled 2014-07-13 (×5): qty 1

## 2014-07-13 MED ORDER — DARBEPOETIN ALFA 150 MCG/0.3ML IJ SOSY
150.0000 ug | PREFILLED_SYRINGE | INTRAMUSCULAR | Status: DC
  Filled 2014-07-13: qty 0.3

## 2014-07-13 NOTE — Consult Note (Signed)
[facility] Renal Consultation Note  Indication for Consultation:  Management of ESRD/hemodialysis; anemia, hypertension/volume and secondary hyperparathyroidism  HPI: Carl Mcgann Sr. is a 54 y.o. male ESRD on HD mwf([facility]) admitted with rectal bleeding ( hx Diverticulitis  by CT 2012) . Had HD yesterday and during hd had to use Bathroom= "lots of Bloody stool and came to ER". Hgb 6.1 on admit  Op labs hgb 10.2 07/07/14. This am 6.8  NO TRANSFUSION sec Johov Witness,but states "if Life threatening will take blood". Denies abdominal pain , cp, fevers, dizziness, or troubles using his AVF.Noted he is recently put on hold on [place] [facility] Transplant list( ? tia cva Apr 2016 admit needs op neuro fu) last week had OP HD [facility]. On  a fishing trip with another Dialysis Pt from [place] center,"had small amount of blood in stool then and ONCE his Dialyzer clotted and unable to get blood back.". Now finishing prep for his colonoscopy with GI following .No dizziness. K pending this am .     Past Medical History  Diagnosis Date  . Hypertension   . Anemia   . Chronic headaches   . Sleep apnea   . Chronic kidney disease     Chronic Renal Failure; On Renal Transplant List  . Presence of arteriovenous fistula for hemodialysis, primary     RUE  . Hemodialysis patient     M-W-F    Past Surgical History  Procedure Laterality Date  . Av fistula placement Right     right arm  . Graft left arm Left     for dialysis x 2  . Insertion of dialysis catheter      Rt chest      Family History  Problem Relation Age of Onset  . Diabetes Father   . Stroke Father   . Hypertension Father   . Uterine cancer Mother   . Lupus Sister   . Stroke Sister   . Anuerysm Brother     brain  . Hypertension Sister       reports that he quit smoking about 24 years ago. His smoking use included Cigars. He does not have any smokeless tobacco history on file. He reports that he drinks alcohol. He  reports that he does not use illicit drugs.   Allergies  Allergen Reactions  . Infed [Iron Dextran] Other (See Comments)    Decreased BP   . Oxycodone Nausea Only  . Vicodin [Hydrocodone-Acetaminophen] Other (See Comments)    Decreased BP  . Diclofenac Other (See Comments)    Unknown  . Whole Blood Other (See Comments)    Unk    Prior to Admission medications   Medication Sig Start Date End Date Taking? Authorizing Provider  aspirin 325 MG tablet Take 1 tablet (325 mg total) by mouth daily. 05/25/14  Yes Thurnell Lose, MD  atorvastatin (LIPITOR) 10 MG tablet Take 1 tablet (10 mg total) by mouth daily at 6 PM. 05/25/14  Yes Thurnell Lose, MD  calcium acetate (PHOSLO) 667 MG capsule Take 3,335 mg by mouth 3 (three) times daily with meals.    Yes Historical Provider, MD  cinacalcet (SENSIPAR) 60 MG tablet Take 120 mg by mouth daily.    Yes Historical Provider, MD  gabapentin (NEURONTIN) 300 MG capsule Take 300 mg by mouth daily.    Yes Historical Provider, MD  ibuprofen (ADVIL,MOTRIN) 800 MG tablet Take 800 mg by mouth 2 (two) times daily.   Yes Historical  Provider, MD  lanthanum (FOSRENOL) 1000 MG chewable tablet Chew 1,000 mg by mouth 3 (three) times daily with meals.   Yes Historical Provider, MD  metoprolol tartrate (LOPRESSOR) 25 MG tablet Take 1 tablet (25 mg total) by mouth 2 (two) times daily. 05/25/14  Yes Thurnell Lose, MD  Multiple Vitamin (MULTI VITAMIN MENS PO) Take 1 tablet by mouth daily.     Yes Historical Provider, MD  sorbitol 70 % solution Take 15-60 mLs by mouth daily as needed (dialysis days).    Yes Historical Provider, MD     Anti-infectives    None      Results for orders placed or performed during the hospital encounter of 07/12/14 (from the past 48 hour(s))  CBC     Status: Abnormal   Collection Time: 07/12/14 10:30 AM  Result Value Ref Range   WBC 12.3 (H) 4.0 - 10.5 K/uL   RBC 2.09 (L) 4.22 - 5.81 MIL/uL   Hemoglobin 6.1 (LL) 13.0 - 17.0 g/dL     Comment: REPEATED TO VERIFY CRITICAL RESULT CALLED TO, READ BACK BY AND VERIFIED WITH: LINDSAY ROBERTS,RN AT 1053 07/12/14 BY ZBEECH.    HCT 19.0 (L) 39.0 - 52.0 %   MCV 90.9 78.0 - 100.0 fL   MCH 29.2 26.0 - 34.0 pg   MCHC 32.1 30.0 - 36.0 g/dL   RDW 15.7 (H) 11.5 - 15.5 %   Platelets 265 150 - 400 K/uL  Comprehensive metabolic panel     Status: Abnormal   Collection Time: 07/12/14 10:30 AM  Result Value Ref Range   Sodium 141 135 - 145 mmol/L   Potassium 5.1 3.5 - 5.1 mmol/L   Chloride 101 101 - 111 mmol/L   CO2 21 (L) 22 - 32 mmol/L   Glucose, Bld 103 (H) 65 - 99 mg/dL   BUN 95 (H) 6 - 20 mg/dL   Creatinine, Ser 11.47 (H) 0.61 - 1.24 mg/dL   Calcium 8.2 (L) 8.9 - 10.3 mg/dL   Total Protein 7.1 6.5 - 8.1 g/dL   Albumin 2.9 (L) 3.5 - 5.0 g/dL   AST 14 (L) 15 - 41 U/L   ALT 10 (L) 17 - 63 U/L   Alkaline Phosphatase 62 38 - 126 U/L   Total Bilirubin 0.5 0.3 - 1.2 mg/dL   GFR calc non Af Amer 4 (L) >60 mL/min   GFR calc Af Amer 5 (L) >60 mL/min    Comment: (NOTE) The eGFR has been calculated using the CKD EPI equation. This calculation has not been validated in all clinical situations. eGFR's persistently <60 mL/min signify possible Chronic Kidney Disease.    Anion gap 19 (H) 5 - 15    Comment: Performed at [facility]  Type and screen     Status: None   Collection Time: 07/12/14 10:30 AM  Result Value Ref Range   ABO/RH(D) O POS    Antibody Screen NEG    Sample Expiration 07/15/2014   ABO/Rh     Status: None   Collection Time: 07/12/14 10:30 AM  Result Value Ref Range   ABO/RH(D) O POS   MRSA PCR Screening     Status: None   Collection Time: 07/12/14  2:00 PM  Result Value Ref Range   MRSA by PCR NEGATIVE NEGATIVE    Comment:        The GeneXpert MRSA Assay (FDA approved for NASAL specimens only), is one component of a comprehensive MRSA colonization surveillance program. It is  not intended to diagnose MRSA infection nor to guide or monitor  treatment for MRSA infections.   Glucose, capillary     Status: None   Collection Time: 07/12/14  3:37 PM  Result Value Ref Range   Glucose-Capillary 88 65 - 99 mg/dL  CBC     Status: Abnormal   Collection Time: 07/12/14  5:40 PM  Result Value Ref Range   WBC 10.6 (H) 4.0 - 10.5 K/uL   RBC 2.37 (L) 4.22 - 5.81 MIL/uL   Hemoglobin 6.9 (LL) 13.0 - 17.0 g/dL    Comment: QUANTITY NOT SUFFICIENT TO REPEAT TEST SPECIMEN CHECKED FOR CLOTS CRITICAL RESULT CALLED TO, READ BACK BY AND VERIFIED WITH: Lucila Maine 1856 07/12/2014 WBOND    HCT 21.2 (L) 39.0 - 52.0 %   MCV 89.5 78.0 - 100.0 fL   MCH 29.1 26.0 - 34.0 pg   MCHC 32.5 30.0 - 36.0 g/dL   RDW 15.9 (H) 11.5 - 15.5 %   Platelets 144 (L) 150 - 400 K/uL  CBC     Status: Abnormal   Collection Time: 07/12/14 11:12 PM  Result Value Ref Range   WBC 10.9 (H) 4.0 - 10.5 K/uL   RBC 2.25 (L) 4.22 - 5.81 MIL/uL   Hemoglobin 6.8 (LL) 13.0 - 17.0 g/dL    Comment: REPEATED TO VERIFY CRITICAL VALUE NOTED.  VALUE IS CONSISTENT WITH PREVIOUSLY REPORTED AND CALLED VALUE.    HCT 20.2 (L) 39.0 - 52.0 %   MCV 89.8 78.0 - 100.0 fL   MCH 30.2 26.0 - 34.0 pg   MCHC 33.7 30.0 - 36.0 g/dL   RDW 15.6 (H) 11.5 - 15.5 %   Platelets 226 150 - 400 K/uL     ROS: see hpi for positives   Physical Exam: Filed Vitals:   07/13/14 0600  BP: 120/75  Pulse: 91  Temp:   Resp: 14     General:Alert pleasant BM , NAD , Cooperative OX3 HEENT:[place], MMM, nonicteric Neck: no jvd, supple Heart: RRR, soft 1/6  sem lsb ,  No rub  Lungs: CTA bilat Abdomen: BS pos, soft, NT Extremities: trace pedal edema, Skin: no overt rash, no pedal ulcers Neuro: alert, OX3, no acute focal deficits Dialysis Access: Pos. Bruit  R U A AVF  Dialysis Orders: [facility] M[facility] . EDW 107 HD Bath 1k,2.25ca  Time 4hr 30 min  Heparin 2100. Access RA AVF    Hectorol 3  mcg IV/HD Mircera 1mg q 2 weeks  07/07/14 given  hd   Units IV/HD  Venofer 50 mg q wkly hd  Other op labs  07/07/14  ca 9.2/phos 6.5 pth 599  Assessment/Plan 1. Rectal Bleeding - GI following  HGB 6.1>6.8 this am  ( Jeh. Wit."may take blood if life threatening ") 2. ESRD - HD MWF ([facility]) no hep hd , ck K this am pending, Next HD am if k ok /vol stable  3. Hypertension/volume  - last bp okay / vol stable  4. Anemia  - Of Chronic dz and Lower GI Bld= Fe dosed on HD / and ESA max  5. Metabolic bone disease -  IV vit d on HD and Phos binder when eating  6. Nutrition - Renal diet  And renal vitamin  When eating  7.    ? HO  April CVA with MRI suggests only a punctate stroke, possibility of PRES syndrome- was to have neurology fu  As op but but still pending and Renal  TX on hold.  Ernest Haber, PA-C [place] [phone] 07/13/2014, 8:46 AM   Pt seen, examined and agree w A/P as above.  Kelly Splinter MD pager [phone]    cell [phone] 07/13/2014, 12:12 PM

## 2014-07-13 NOTE — Progress Notes (Signed)
Dr. Randell Patient returned page and no orders given.
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
## 2014-07-13 NOTE — Progress Notes (Signed)
Subjective: No acute events overnight. He reports he is doing well this morning. Denies chest pain or shortness of breath. Last bowel movement this morning. Denies hematochezia or melena.  Objective: Vital signs in last 24 hours: Filed Vitals:   07/13/14 0200 07/13/14 0400 07/13/14 0500 07/13/14 0600  BP: 95/71 107/63  120/75  Pulse: 84 85  91  Temp:  98.1 F (36.7 C)    TempSrc:  Oral    Resp: _0 Height:      Weight:   244 lb 4.3 oz (110.8 kg)   SpO2: 100% 99%  100%   Weight change:   Intake/Output Summary (Last 24 hours) at 07/13/14 0810 Last data filed at 07/12/14 1800  Gross per 24 hour  Intake    463 ml  Output      0 ml  Net    463 ml   General Apperance: NAD HEENT: Normocephalic, atraumatic, PERRL, EOMI, anicteric sclera Neck: Supple, trachea midline Lungs: Clear to auscultation bilaterally. No wheezes, rhonchi or rales. Breathing comfortably Heart: Regular rate and rhythm, no murmur/rub/gallop Abdomen: Soft, nontender, nondistended, no rebound/guarding Extremities: Normal, atraumatic, warm and well perfused, trace edema Pulses: 2+ throughout Skin: No rashes or lesions Neurologic: Alert and oriented x 3. CNII-XII intact. Normal strength and sensation  Lab Results: Basic Metabolic Panel:  Recent Labs Lab 07/12/14 1030  NA 141  K 5.1  CL 101  CO2 21*  GLUCOSE 103*  BUN 95*  CREATININE 11.47*  CALCIUM 8.2*   Liver Function Tests:  Recent Labs Lab 07/12/14 1030  AST 14*  ALT 10*  ALKPHOS 62  BILITOT 0.5  PROT 7.1  ALBUMIN 2.9*   CBC:  Recent Labs Lab 07/12/14 1740 07/12/14 2312  WBC 10.6* 10.9*  HGB 6.9* 6.8*  HCT 21.2* 20.2*  MCV 89.5 89.8  PLT 144* 226   CBG:  Recent Labs Lab 07/12/14 1537  GLUCAP 88    Micro Results: Recent Results (from the past 240 hour(s))  MRSA PCR Screening     Status: None   Collection Time: 07/12/14  2:00 PM  Result Value Ref Range Status   MRSA by PCR NEGATIVE NEGATIVE Final   Comment:        The GeneXpert MRSA Assay (FDA approved for NASAL specimens only), is one component of a comprehensive MRSA colonization surveillance program. It is not intended to diagnose MRSA infection nor to guide or monitor treatment for MRSA infections.    Studies/Results: No results found. Medications: I have reviewed the patient's current medications. Scheduled Meds: . atorvastatin  10 mg Oral q1800  . calcium acetate  3,335 mg Oral TID WC  . cinacalcet  120 mg Oral Q supper  . lanthanum  1,000 mg Oral TID WC  . peg 3350 powder  0.5 kit Oral Once  . sodium chloride  3 mL Intravenous Q12H   Continuous Infusions:  PRN Meds:. Assessment/Plan: Active Problems:   Rectal bleed   Lower GI bleed Lower GI Bleed: Resolving. Remains hemodynamically stable. -Bowel prep, but hold on colonoscopy unless further bleeding recurs -If develops brisk bleeding, IR for mesenteric arteriogram -Outpatient colonoscopy when hemoglobin greater than 8.5 -Recheck CBC in a.m.  Acute on chronic anemia: Previous hemoglobin 10.5 on 07/01/2014. Hemoglobin on admission 6.1. Hemoglobin stable at 6.8. -Aranesp -Obtain iron, TIBC, ferritin, folate, reticulocyte, vitamin B12, peripheral smear  HTN: Presently normotensive in the ED -Hold home metoprolol 25 mg twice a day  ESRD on HD MWF -Renal following,  appreciate recommendations -Continue home calcium acetate 3335 mg 3 times a day, Sensipar 120 mg daily, lanthanum 1000 mg 3 times a day  Hyperlipidemia: Continue home atorvastatin 10 mg daily at bedtime  FEN: Renal diet  VTE ppx: SCDs  Dispo: Disposition is deferred at this time, awaiting improvement of current medical problems.  Anticipated discharge in approximately 1-2 day(s).   The patient does have a current PCP Jamal Maes, MD) and does not need an [facility] hospital follow-up appointment after discharge.  The patient does not have transportation limitations that hinder transportation to  clinic appointments.  .Services Needed at time of discharge: Y = Yes, Blank = No PT:   OT:   RN:   Equipment:   Other:     LOS: 1 day   Milagros Loll, MD 07/13/2014, 8:10 AM
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
## 2014-07-13 NOTE — Progress Notes (Signed)
Dr. Jonnie Finner phoned and given results of patients BUN of 104, Creat 14.24 and potassium of 6.2. Ok to be transferred to [facility].

## 2014-07-13 NOTE — Progress Notes (Signed)
Pt arrived to 6E01 via wheelchair. Oriented to unit and staff. Pt in bed watching TV. Son at bedside. Will continue to monitor. Tyna Jaksch, RN

## 2014-07-13 NOTE — Progress Notes (Signed)
Received critical potassium of 6.2.. MD notified. Waiting return call.

## 2014-07-13 NOTE — Progress Notes (Signed)
CRITICAL VALUE ALERT  Critical value received: Potassium  6.2  Date of notification:  07/13/2014  Time of notification:  [id number]  Critical value read back:Yes.    Nurse who received alert:  Will Bonnet, RN  MD notified (1st page):  Dr. Randell Patient  Time of first page:  [phone]  MD notified (2nd page):  Time of second page:  Responding MD: Dr. Randell Patient  Time MD responded:  [phone]

## 2014-07-14 ENCOUNTER — Encounter: Payer: Self-pay | Admitting: Internal Medicine

## 2014-07-14 DIAGNOSIS — K5731 Diverticulosis of large intestine without perforation or abscess with bleeding: Secondary | ICD-10-CM

## 2014-07-14 DIAGNOSIS — Z79899 Other long term (current) drug therapy: Secondary | ICD-10-CM

## 2014-07-14 LAB — IRON AND TIBC
Iron: 204 ug/dL — ABNORMAL HIGH (ref 45–182)
Saturation Ratios: 85 % — ABNORMAL HIGH (ref 17.9–39.5)
TIBC: 239 ug/dL — ABNORMAL LOW (ref 250–450)
UIBC: 35 ug/dL

## 2014-07-14 LAB — RENAL FUNCTION PANEL
Albumin: 2.7 g/dL — ABNORMAL LOW (ref 3.5–5.0)
Anion gap: 20 — ABNORMAL HIGH (ref 5–15)
BUN: 109 mg/dL — ABNORMAL HIGH (ref 6–20)
CO2: 22 mmol/L (ref 22–32)
Calcium: 8.5 mg/dL — ABNORMAL LOW (ref 8.9–10.3)
Chloride: 96 mmol/L — ABNORMAL LOW (ref 101–111)
Creatinine, Ser: 15.58 mg/dL — ABNORMAL HIGH (ref 0.61–1.24)
GFR calc Af Amer: 3 mL/min — ABNORMAL LOW (ref >60)
GFR calc non Af Amer: 3 mL/min — ABNORMAL LOW (ref >60)
Glucose, Bld: 91 mg/dL (ref 65–99)
Phosphorus: 5.5 mg/dL — ABNORMAL HIGH (ref 2.5–4.6)
Potassium: 5.7 mmol/L — ABNORMAL HIGH (ref 3.5–5.1)
Sodium: 138 mmol/L (ref 135–145)

## 2014-07-14 LAB — CBC
HCT: 17.7 % — ABNORMAL LOW (ref 39.0–52.0)
HCT: 18.3 % — ABNORMAL LOW (ref 39.0–52.0)
Hemoglobin: 5.9 g/dL — CL (ref 13.0–17.0)
Hemoglobin: 6.1 g/dL — CL (ref 13.0–17.0)
MCH: 29.6 pg (ref 26.0–34.0)
MCH: 29.8 pg (ref 26.0–34.0)
MCHC: 33.3 g/dL (ref 30.0–36.0)
MCHC: 33.3 g/dL (ref 30.0–36.0)
MCV: 88.9 fL (ref 78.0–100.0)
MCV: 89.3 fL (ref 78.0–100.0)
Platelets: 234 10*3/uL (ref 150–400)
Platelets: 229 10*3/uL (ref 150–400)
RBC: 1.99 MIL/uL — ABNORMAL LOW (ref 4.22–5.81)
RBC: 2.05 MIL/uL — ABNORMAL LOW (ref 4.22–5.81)
RDW: 15.7 % — ABNORMAL HIGH (ref 11.5–15.5)
RDW: 15.7 % — ABNORMAL HIGH (ref 11.5–15.5)
WBC: 8.5 10*3/uL (ref 4.0–10.5)
WBC: 6.9 10*3/uL (ref 4.0–10.5)

## 2014-07-14 LAB — TECHNOLOGIST SMEAR REVIEW

## 2014-07-14 LAB — HIV ANTIBODY (ROUTINE TESTING W REFLEX): HIV Screen 4th Generation wRfx: NONREACTIVE

## 2014-07-14 LAB — FERRITIN: Ferritin: 1462 ng/mL — ABNORMAL HIGH (ref 24–336)

## 2014-07-14 LAB — RETICULOCYTES
RBC.: 1.99 MIL/uL — ABNORMAL LOW (ref 4.22–5.81)
Retic Count, Absolute: 37.8 10*3/uL (ref 19.0–186.0)
Retic Ct Pct: 1.9 % (ref 0.4–3.1)

## 2014-07-14 LAB — FOLATE: Folate: 17.6 ng/mL (ref >5.9)

## 2014-07-14 LAB — VITAMIN B12: Vitamin B-12: 1227 pg/mL — ABNORMAL HIGH (ref 180–914)

## 2014-07-14 MED ORDER — DARBEPOETIN ALFA 150 MCG/0.3ML IJ SOSY
150.0000 ug | PREFILLED_SYRINGE | INTRAMUSCULAR | Status: DC
  Filled 2014-07-14: qty 0.3

## 2014-07-14 MED ORDER — DARBEPOETIN ALFA 150 MCG/0.3ML IJ SOSY
PREFILLED_SYRINGE | INTRAMUSCULAR | Status: AC
  Filled 2014-07-14: qty 0.3

## 2014-07-14 NOTE — Progress Notes (Signed)
Patient seen and examined. Case d/w residents in detail on morning rounds. I agree with findings and plan as documented in Dr. Trudee Kuster note.  Patient denies any new bleeding episodes. He refuses transfusion at the current time [facility] witness).  Hg decreased to 6.1 today. GI signed off given no acute bleeding at this time and inablity to do colonoscopy given low Hg. C/w Aranesp and IV iron. Will monitor CBC daily  C/w HD per nephrology

## 2014-07-14 NOTE — Progress Notes (Signed)
Hemodialysis- Tx stopped d/t clotted dialyzer. Unable to give all of venous line back, arterial line able to be rinsed back completely. Dr. Jonnie Finner notified.

## 2014-07-14 NOTE — Progress Notes (Signed)
Hemodialysis- Complete, pt tolerated well, CBC sent to lab per order. Report called to [facility] on 6E

## 2014-07-14 NOTE — Progress Notes (Signed)
CRITICAL VALUE ALERT  Critical value received: hgb 5.9  Date of notification:  07/14/2014  Time of notification:  0830  Critical value read back:yes  Nurse who received alert:  Fatima Blank  MD notified (1st page):  [place] (at bedside)  Time of first page:   MD notified (2nd page):  Time of second page:  Responding MD:  Jonnie Finner  Time MD responded:  [id number]

## 2014-07-14 NOTE — Progress Notes (Addendum)
Subjective:  Denies any hematochezia since yesterday. Denies any significant dizziness or chest pain. Still considering the effects of blood transfusion.  Objective: Vital signs in last 24 hours: Filed Vitals:   07/14/14 1107 07/14/14 1122 07/14/14 1156 07/14/14 1544  BP: 97/72 104/71 108/74 112/80  Pulse: 107  95 92  Temp:  98.5 F (36.9 C) 98.4 F (36.9 C) 98.2 F (36.8 C)  TempSrc:  Oral Oral Oral  Resp:  '15 15 16  ' Height:      Weight: 238 lb 5.1 oz (108.1 kg)     SpO2: 98%  100% 100%   Weight change: 7 lb 14.7 oz (3.593 kg)  Intake/Output Summary (Last 24 hours) at 07/14/14 1621 Last data filed at 07/14/14 1122  Gross per 24 hour  Intake    720 ml  Output   2506 ml  Net  -1786 ml   General Apperance: NAD HEENT: Normocephalic, atraumatic, PERRL, EOMI, anicteric sclera, conjunctiva pallor Neck: Supple, trachea midline Lungs: Clear to auscultation bilaterally. No wheezes, rhonchi or rales. Breathing comfortably Heart: Regular rate and rhythm, no murmur/rub/gallop Abdomen: Soft, nontender, nondistended, no rebound/guarding Extremities: Normal, atraumatic, warm and well perfused, trace edema Pulses: 2+ throughout Skin: No rashes or lesions Neurologic: Alert and oriented x 3. CNII-XII intact. Normal strength and sensation  Lab Results: Basic Metabolic Panel:  Recent Labs Lab 07/13/14 1355 07/14/14 0500  NA 141 138  K 6.2* 5.7*  CL 99* 96*  CO2 20* 22  GLUCOSE 92 91  BUN 104* 109*  CREATININE 14.24* 15.58*  CALCIUM 8.4* 8.5*  PHOS  --  5.5*   Liver Function Tests:  Recent Labs Lab 07/12/14 1030 07/14/14 0500  AST 14*  --   ALT 10*  --   ALKPHOS 62  --   BILITOT 0.5  --   PROT 7.1  --   ALBUMIN 2.9* 2.7*   CBC:  Recent Labs Lab 07/14/14 0500 07/14/14 1131  WBC 8.5 6.9  HGB 5.9* 6.1*  HCT 17.7* 18.3*  MCV 88.9 89.3  PLT 234 229   CBG:  Recent Labs Lab 07/12/14 1537  GLUCAP 88    Micro Results: Recent Results (from the past 240  hour(s))  MRSA PCR Screening     Status: None   Collection Time: 07/12/14  2:00 PM  Result Value Ref Range Status   MRSA by PCR NEGATIVE NEGATIVE Final    Comment:        The GeneXpert MRSA Assay (FDA approved for NASAL specimens only), is one component of a comprehensive MRSA colonization surveillance program. It is not intended to diagnose MRSA infection nor to guide or monitor treatment for MRSA infections.    Studies/Results: No results found. Medications: I have reviewed the patient's current medications. Scheduled Meds: . atorvastatin  10 mg Oral q1800  . calcium acetate  3,335 mg Oral TID WC  . cinacalcet  120 mg Oral Q supper  . [START ON 07/16/2014] darbepoetin (ARANESP) injection - DIALYSIS  150 mcg Intravenous Q Fri-HD  . multivitamin  1 tablet Oral QHS  . peg 3350 powder  0.5 kit Oral Once  . sodium chloride  3 mL Intravenous Q12H   Continuous Infusions:  PRN Meds:. Assessment/Plan: Principal Problem:   Lower GI bleed Active Problems:   HTN (hypertension)   ESRD on dialysis   Refusal of blood transfusions as patient is Jehovah's Witness   Diverticulosis   Secondary hyperparathyroidism  Lower GI Bleed: Seems resolved clinically although there is a  drop of hemoglobin to 5.9 which is stable at 6.1 on serial exam today. Remains hemodynamically stable. Still considering the ethics of blood transfusion. Was extensively counseled on the extreme risks of morbidity and mortality of not accepting blood transfusion at his current hemoglobin levels. -If develops brisk bleeding, IR for mesenteric arteriogram -Outpatient colonoscopy when hemoglobin greater than 8.5 -Recheck CBC in a.m. -Continue to revisit possibility of blood transfusion -Check orthostatics  Acute on chronic anemia: Hemoglobin down to 5.9 from 6.8 yesterday. It was stable later today at 6.1. -Aranesp -Anemia panel unremarkable although results biased by recent administration of IV iron.  HTN:  Currently normotensive. -Hold home metoprolol 25 mg twice a day  ESRD on HD MWF -Renal following, appreciate recommendations -Continue home calcium acetate 3335 mg 3 times a day, Sensipar 120 mg daily, lanthanum 1000 mg 3 times a day  Hyperlipidemia: Continue home atorvastatin 10 mg daily at bedtime  FEN: Renal diet  VTE ppx: SCDs  Dispo: Disposition is deferred at this time, awaiting improvement of current medical problems.  Anticipated discharge in approximately 1-2 day(s).   The patient does have a current PCP Jamal Maes, MD) and does not need an [facility] hospital follow-up appointment after discharge.  The patient does not have transportation limitations that hinder transportation to clinic appointments.  .Services Needed at time of discharge: Y = Yes, Blank = No PT:   OT:   RN:   Equipment:   Other:     LOS: 2 days   Luan Moore, MD 07/14/2014, 4:21 PM

## 2014-07-14 NOTE — Progress Notes (Signed)
  [facility] Progress Note   Subjective: Hb 5.9, no complaints  Filed Vitals:   07/14/14 0725 07/14/14 0802 07/14/14 0830 07/14/14 0857  BP: 115/74 109/75 122/76 108/76  Pulse: 90 83 81 83  Temp:      TempSrc:      Resp: $Remo'16 16 16 16  'uFoxT$ Height:      Weight:      SpO2:       Exam: Alert, no distress No jvd Chest clear bilat RRR soft SEM no rub Abd soft ntnd +bs No edema  Neuro is nf, ox 3 RUA AVF +bruit  HD: [facility] M[facility] 4.5h  1/2.25 bath  107kg   Hep 2100   RUA AVF Hect 3, MIrcera 50 last 5/25, Venofer 50/wk OP labs Ca 9.2, phos 6.5, pth 599        Assessment: 1. GI bleed - per GI 2. Anemia d/t blood loss/ CKD. [place] witness, "may take blood if life-threatening" 3. ESRD on HD 4. HTN/ vol 3-4 kg up, stable, no BP meds, BP's lowish 5. MBD cont binder, vit D 6. Hx CVA April '16 (puncatate stroke, possible PRES by MRI, was to have neuro f/u but still pend) and renal Tx on hold   Plan - HD today, no heparin d/t GIB    Kelly Splinter MD  pager [phone]    cell [phone]  07/14/2014, 9:04 AM     Recent Labs Lab 07/12/14 1030 07/13/14 1355 07/14/14 0500  NA 141 141 138  K 5.1 6.2* 5.7*  CL 101 99* 96*  CO2 21* 20* 22  GLUCOSE 103* 92 91  BUN 95* 104* 109*  CREATININE 11.47* 14.24* 15.58*  CALCIUM 8.2* 8.4* 8.5*  PHOS  --   --  5.5*    Recent Labs Lab 07/12/14 1030 07/14/14 0500  AST 14*  --   ALT 10*  --   ALKPHOS 62  --   BILITOT 0.5  --   PROT 7.1  --   ALBUMIN 2.9* 2.7*    Recent Labs Lab 07/12/14 1740 07/12/14 2312 07/14/14 0500  WBC 10.6* 10.9* 8.5  HGB 6.9* 6.8* 5.9*  HCT 21.2* 20.2* 17.7*  MCV 89.5 89.8 88.9  PLT 144* 226 234   . atorvastatin  10 mg Oral q1800  . calcium acetate  3,335 mg Oral TID WC  . cinacalcet  120 mg Oral Q supper  . Darbepoetin Alfa      . [START ON 07/16/2014] darbepoetin (ARANESP) injection - DIALYSIS  150 mcg Intravenous Q Fri-HD  . multivitamin  1 tablet Oral QHS  . peg 3350 powder  0.5 kit  Oral Once  . sodium chloride  3 mL Intravenous Q12H

## 2014-07-14 NOTE — Progress Notes (Signed)
   Subjective  No complaints, undergoing dialysis,    Objective  LGIB likely diverticular. Last stool > 24 hours ago, denies abdominal pain. Pt declines blood transfusions  based on his religious beliefs. Hgb down to 5.9/17.0,  Vital signs in last 24 hours: Temp:  [97.7 F (36.5 C)-98.3 F (36.8 C)] 97.9 F (36.6 C) (06/01 0700) Pulse Rate:  [76-99] 81 (06/01 0930) Resp:  [16-18] 16 (06/01 0930) BP: (105-135)/(72-87) 117/87 mmHg (06/01 0930) SpO2:  [100 %] 100 % (06/01 0720) Weight:  [245 lb 6 oz (111.3 kg)-256 lb 9.6 oz (116.393 kg)] 245 lb 6 oz (111.3 kg) (06/01 0700) Last BM Date: 07/13/14 General:    AAmale in NAD Heart:  Regular rapid  rate and rhythm; no murmurs Lungs: Respirations even and unlabored, lungs CTA bilaterally Abdomen:  Soft, nontender and nondistended. Normal bowel sounds. Extremities:  Without edema. Neurologic:  Alert and oriented,  grossly normal neurologically. Psych:  Cooperative. Normal mood and affect.  Intake/Output from previous day: 05/31 0701 - 06/01 0700 In: 720 [P.O.:720] Out: -  Intake/Output this shift:    Lab Results:  Recent Labs  07/12/14 1740 07/12/14 2312 07/14/14 0500  WBC 10.6* 10.9* 8.5  HGB 6.9* 6.8* 5.9*  HCT 21.2* 20.2* 17.7*  PLT 144* 226 234   BMET  Recent Labs  07/12/14 1030 07/13/14 1355 07/14/14 0500  NA 141 141 138  K 5.1 6.2* 5.7*  CL 101 99* 96*  CO2 21* 20* 22  GLUCOSE 103* 92 91  BUN 95* 104* 109*  CREATININE 11.47* 14.24* 15.58*  CALCIUM 8.2* 8.4* 8.5*   LFT  Recent Labs  07/12/14 1030 07/14/14 0500  PROT 7.1  --   ALBUMIN 2.9* 2.7*  AST 14*  --   ALT 10*  --   ALKPHOS 62  --   BILITOT 0.5  --    PT/INR No results for input(s): LABPROT, INR in the last 72 hours.  Studies/Results: No results found.     Assessment / Plan:   LGIB likely diverticular, severe post hemorrhagic anemia. No active bleeding x 24 hours. Sharma Covert gnostic work up ( colonoscopy) on hold till Hgb > 8.0 But pt still  declining blood products. He is not bleeding actively enough to undergo mesenteric arteriogram..We will sign off at this point. Agree with Iron infusion, and minimal phlebotomies.  Principal Problem:   Lower GI bleed Active Problems:   HTN (hypertension)   ESRD on dialysis   Refusal of blood transfusions as patient is Jehovah's Witness   Diverticulosis   Secondary hyperparathyroidism     LOS: 2 days   Delfin Edis  07/14/2014, 10:01 AM

## 2014-07-15 LAB — RENAL FUNCTION PANEL
Albumin: 2.7 g/dL — ABNORMAL LOW (ref 3.5–5.0)
Anion gap: 14 (ref 5–15)
BUN: 50 mg/dL — ABNORMAL HIGH (ref 6–20)
CO2: 27 mmol/L (ref 22–32)
Calcium: 8.3 mg/dL — ABNORMAL LOW (ref 8.9–10.3)
Chloride: 96 mmol/L — ABNORMAL LOW (ref 101–111)
Creatinine, Ser: 10.17 mg/dL — ABNORMAL HIGH (ref 0.61–1.24)
GFR calc Af Amer: 6 mL/min — ABNORMAL LOW (ref >60)
GFR calc non Af Amer: 5 mL/min — ABNORMAL LOW (ref >60)
Glucose, Bld: 85 mg/dL (ref 65–99)
Phosphorus: 5.4 mg/dL — ABNORMAL HIGH (ref 2.5–4.6)
Potassium: 5.4 mmol/L — ABNORMAL HIGH (ref 3.5–5.1)
Sodium: 137 mmol/L (ref 135–145)

## 2014-07-15 LAB — CBC
HCT: 18.5 % — ABNORMAL LOW (ref 39.0–52.0)
Hemoglobin: 6.1 g/dL — CL (ref 13.0–17.0)
MCH: 29.9 pg (ref 26.0–34.0)
MCHC: 33 g/dL (ref 30.0–36.0)
MCV: 90.7 fL (ref 78.0–100.0)
Platelets: 245 10*3/uL (ref 150–400)
RBC: 2.04 MIL/uL — ABNORMAL LOW (ref 4.22–5.81)
RDW: 15.9 % — ABNORMAL HIGH (ref 11.5–15.5)
WBC: 7 10*3/uL (ref 4.0–10.5)

## 2014-07-15 LAB — PARATHYROID HORMONE, INTACT (NO CA): PTH: 406 pg/mL — ABNORMAL HIGH (ref 15–65)

## 2014-07-15 LAB — HEPATITIS B SURFACE ANTIGEN: Hepatitis B Surface Ag: NEGATIVE — AB

## 2014-07-15 NOTE — Progress Notes (Signed)
[facility] Progress Note  Assessment/Plan: 1. Rectal Bleeding - GI consulted-suspected diverticular bleed. Managed per primary team. 2. ESRD - HD MWF ([facility]) no heparin. HD orders written for 06/03/216  3. Hypertension/volume - HD yesterday. Net UF 2506. SBP 100s.  4. Anemia - Of Chronic dz and Lower GI Bld= Fe dosed on HD / and ESA max HGB 6.1. Patient is Jehovah Witness. 5. Metabolic bone disease -C Ca+ 9.34. Phos 5.4. Phoslo and sensipar as ordered. 6. Nutrition - Renal diet. Albumin 2.7. Add nepro and renal vit Rita H. Brown NP-C 07/15/2014, 11:47 AM  [facility] [phone]  Pt seen, examined and agree w A/P as above.  Kelly Splinter MD pager [phone] cell [phone] 07/15/2014, 2:22 PM    Subjective:   Objective Filed Vitals:   07/14/14 1712 07/14/14 2125 07/15/14 0439 07/15/14 0944  BP: 111/65 121/88 109/68 116/75  Pulse: 83 101 105 100  Temp: 98.3 F (36.8 C) 98.4 F (36.9 C) 98.3 F (36.8 C) 98.6 F (37 C)  TempSrc: Oral Oral Oral Oral  Resp: _0 Height:      Weight:  107.049 kg (236 lb)    SpO2: 100% 97% 100% 100%   Physical Exam General: pleasant, well nourished appearing male in NAD Heart: S1,S2, RRR. No M/G/R Lungs: bilateral breath sounds CTA A/P Abdomen: Soft nontender, active BS X 4 quad. No bloody stools X 2 days. Extremities: No edema. 2+ pulses. Dialysis Access: RUA AVF +Bruit  Dialysis Orders: [facility] 4.5h 1/2.25 bath 107kg Hep 2100 RUA AVF Hect 3, MIrcera 50 last 5/25, Venofer 50/wk OP labs Ca 9.2, phos 6.5, pth 599   Additional Objective Labs: Basic Metabolic Panel:  Last Labs      Recent Labs Lab 07/13/14 1355 07/14/14 0500 07/15/14 0612  NA 141 138 137  K 6.2* 5.7* 5.4*  CL 99* 96* 96*  CO2 20* 22 27  GLUCOSE 92 91 85  BUN 104* 109* 50*  CREATININE 14.24* 15.58* 10.17*  CALCIUM  8.4* 8.5* 8.3*  PHOS --  5.5* 5.4*     Liver Function Tests:  Last Labs      Recent Labs Lab 07/12/14 1030 07/14/14 0500 07/15/14 0612  AST 14* --  --   ALT 10* --  --   ALKPHOS 62 --  --   BILITOT 0.5 --  --   PROT 7.1 --  --   ALBUMIN 2.9* 2.7* 2.7*      Last Labs     No results for input(s): LIPASE, AMYLASE in the last 168 hours.   CBC:  Last Labs      Recent Labs Lab 07/12/14 1740 07/12/14 2312 07/14/14 0500 07/14/14 1131 07/15/14 0612  WBC 10.6* 10.9* 8.5 6.9 7.0  HGB 6.9* 6.8* 5.9* 6.1* 6.1*  HCT 21.2* 20.2* 17.7* 18.3* 18.5*  MCV 89.5 89.8 88.9 89.3 90.7  PLT 144* 226 234 229 245     Blood Culture  Labs (Brief)       Component Value Date/Time   SDES WOUND GRAFT ARM LEFT 03/25/2009 1500   SDES WOUND GRAFT ARM LEFT 03/25/2009 1500   SPECREQUEST PATIENT ON FOLLOWING ANCEF 03/25/2009 1500   SPECREQUEST PATIENT ON FOLLOWING ANCEF 03/25/2009 1500   CULT NO ANAEROBES ISOLATED 03/25/2009 1500   CULT NO GROWTH 2 DAYS 03/25/2009 1500   REPTSTATUS 03/30/2009 FINAL 03/25/2009 1500   REPTSTATUS 03/28/2009 FINAL 03/25/2009 1500      Cardiac Enzymes:  Last Labs  No results for input(s): CKTOTAL, CKMB, CKMBINDEX, TROPONINI in the last 168 hours.   CBG:  Last Labs      Recent Labs Lab 07/12/14 1537  GLUCAP 88      Recent Labs (last 2 labs)     Iron Studies:  Recent Labs  07/14/14 0500  IRON 204*  TIBC 239*  FERRITIN 1462*     _0 @ Studies/Results:  Imaging Results (Last 48 hours)    No results found.   Medications:   . atorvastatin 10 mg Oral q1800  . calcium acetate 3,335 mg Oral TID WC  . cinacalcet 120 mg Oral Q supper  . [START ON 07/16/2014] darbepoetin (ARANESP) injection - DIALYSIS 150 mcg Intravenous Q Fri-HD  . multivitamin 1 tablet Oral QHS  . peg 3350 powder  0.5 kit Oral Once  . sodium chloride 3 mL Intravenous Q12H

## 2014-07-15 NOTE — Progress Notes (Signed)
Patient seen and examined. Case d/w residents in detail on morning rounds. I agree with findings and plan as documented in Dr. Trudee Kuster note.  Patient with no new bleeding episodes. He refuses transfusion at the current time [facility] witness) but will discuss with family and get back to [place] about possibility ogf transfusion.  He has no symptoms of orthostasis and BP remains stable.Hg stable at 6.1 today. GI signed off. C/w Aranesp and IV iron. Will monitor CBC daily. Will consider d/c home in AM if no further bleeding and Hg remains stable. Will need close outpatient f/u  C/w HD per nephrology

## 2014-07-15 NOTE — Progress Notes (Deleted)
[facility] Progress Note  Assessment/Plan: 1. Rectal Bleeding - GI consulted-suspected diverticular bleed. Managed per primary team. 2. ESRD - HD MWF ([facility]) no heparin. HD orders written for 06/03/216  3. Hypertension/volume - HD yesterday. Net UF 2506. SBP 100s.   4. Anemia - Of Chronic dz and Lower GI Bld= Fe dosed on HD / and ESA max HGB 6.1. Patient is Jehovah Witness. 5. Metabolic bone disease -C Ca+ 9.34. Phos 5.4. Phoslo and sensipar as ordered. 6. Nutrition - Renal diet. Albumin 2.7. Add nepro and renal vit Rita H. Brown NP-C 07/15/2014, 11:47 AM  [facility] [phone]  Pt seen, examined and agree w A/P as above.  Kelly Splinter MD pager [phone]    cell [phone] 07/15/2014, 2:22 PM    Subjective:     Objective Filed Vitals:   07/14/14 1712 07/14/14 2125 07/15/14 0439 07/15/14 0944  BP: 111/65 121/88 109/68 116/75  Pulse: 83 101 105 100  Temp: 98.3 F (36.8 C) 98.4 F (36.9 C) 98.3 F (36.8 C) 98.6 F (37 C)  TempSrc: Oral Oral Oral Oral  Resp: '18 18 12 20  ' Height:      Weight:  107.049 kg (236 lb)    SpO2: 100% 97% 100% 100%   Physical Exam General: pleasant, well nourished appearing male in NAD Heart: S1,S2, RRR. No M/G/R Lungs: bilateral breath sounds CTA A/P Abdomen: Soft nontender, active BS X 4 quad. No bloody stools X 2 days. Extremities: No edema. 2+ pulses. Dialysis Access: RUA AVF +Bruit  Dialysis Orders: [facility] 4.5h 1/2.25 bath 107kg Hep 2100 RUA AVF Hect 3, MIrcera 50 last 5/25, Venofer 50/wk OP labs Ca 9.2, phos 6.5, pth 599   Additional Objective Labs: Basic Metabolic Panel:  Recent Labs Lab 07/13/14 1355 07/14/14 0500 07/15/14 0612  NA 141 138 137  K 6.2* 5.7* 5.4*  CL 99* 96* 96*  CO2 20* 22 27  GLUCOSE 92 91 85  BUN 104* 109* 50*  CREATININE 14.24* 15.58* 10.17*  CALCIUM 8.4* 8.5* 8.3*  PHOS  --  5.5* 5.4*   Liver Function Tests:  Recent Labs Lab 07/12/14 1030  07/14/14 0500 07/15/14 0612  AST 14*  --   --   ALT 10*  --   --   ALKPHOS 62  --   --   BILITOT 0.5  --   --   PROT 7.1  --   --   ALBUMIN 2.9* 2.7* 2.7*   No results for input(s): LIPASE, AMYLASE in the last 168 hours. CBC:  Recent Labs Lab 07/12/14 1740 07/12/14 2312 07/14/14 0500 07/14/14 1131 07/15/14 0612  WBC 10.6* 10.9* 8.5 6.9 7.0  HGB 6.9* 6.8* 5.9* 6.1* 6.1*  HCT 21.2* 20.2* 17.7* 18.3* 18.5*  MCV 89.5 89.8 88.9 89.3 90.7  PLT 144* 226 234 229 245   Blood Culture    Component Value Date/Time   SDES WOUND GRAFT ARM LEFT 03/25/2009 1500   SDES WOUND GRAFT ARM LEFT 03/25/2009 1500   SPECREQUEST PATIENT ON FOLLOWING ANCEF 03/25/2009 1500   SPECREQUEST PATIENT ON FOLLOWING ANCEF 03/25/2009 1500   CULT NO ANAEROBES ISOLATED 03/25/2009 1500   CULT NO GROWTH 2 DAYS 03/25/2009 1500   REPTSTATUS 03/30/2009 FINAL 03/25/2009 1500   REPTSTATUS 03/28/2009 FINAL 03/25/2009 1500    Cardiac Enzymes: No results for input(s): CKTOTAL, CKMB, CKMBINDEX, TROPONINI in the last 168 hours. CBG:  Recent Labs Lab 07/12/14 1537  GLUCAP 88   Iron Studies:  Recent Labs  07/14/14  0500  IRON 204*  TIBC 239*  FERRITIN 1462*   '@lablastinr3' @ Studies/Results: No results found. Medications:    atorvastatin  10 mg Oral q1800   calcium acetate  3,335 mg Oral TID WC   cinacalcet  120 mg Oral Q supper   [START ON 07/16/2014] darbepoetin (ARANESP) injection - DIALYSIS  150 mcg Intravenous Q Fri-HD   multivitamin  1 tablet Oral QHS   peg 3350 powder  0.5 kit Oral Once   sodium chloride  3 mL Intravenous Q12H

## 2014-07-15 NOTE — Progress Notes (Signed)
Subjective:  No hematochezia for 2 days now. No significant dizziness or chest pain. Has not yet had a chance to discuss ethics of blood transfusion with family. Still against receiving blood transfusion.  Objective: Vital signs in last 24 hours: Filed Vitals:   07/14/14 1712 07/14/14 2125 07/15/14 0439 07/15/14 0944  BP: 111/65 121/88 109/68 116/75  Pulse: 83 101 105 100  Temp: 98.3 F (36.8 C) 98.4 F (36.9 C) 98.3 F (36.8 C) 98.6 F (37 C)  TempSrc: Oral Oral Oral Oral  Resp: _0 Height:      Weight:  236 lb (107.049 kg)    SpO2: 100% 97% 100% 100%   Weight change: -18 lb 4.5 oz (-8.293 kg)  Intake/Output Summary (Last 24 hours) at 07/15/14 1139 Last data filed at 07/15/14 0910  Gross per 24 hour  Intake    960 ml  Output      0 ml  Net    960 ml   General Apperance: NAD HEENT: Normocephalic, atraumatic, PERRL, EOMI, anicteric sclera, conjunctiva pallor Neck: Supple, trachea midline Lungs: Clear to auscultation bilaterally. No wheezes, rhonchi or rales. Breathing comfortably Heart: Regular rate and rhythm, no murmur/rub/gallop Abdomen: Soft, nontender, nondistended, no rebound/guarding Extremities: Normal, atraumatic, warm and well perfused Pulses: 2+ throughout Skin: No rashes or lesions Neurologic: Alert and oriented x 3. CNII-XII intact. Normal strength and sensation  Lab Results: Basic Metabolic Panel:  Recent Labs Lab 07/14/14 0500 07/15/14 0612  NA 138 137  K 5.7* 5.4*  CL 96* 96*  CO2 22 27  GLUCOSE 91 85  BUN 109* 50*  CREATININE 15.58* 10.17*  CALCIUM 8.5* 8.3*  PHOS 5.5* 5.4*   Liver Function Tests:  Recent Labs Lab 07/12/14 1030 07/14/14 0500 07/15/14 0612  AST 14*  --   --   ALT 10*  --   --   ALKPHOS 62  --   --   BILITOT 0.5  --   --   PROT 7.1  --   --   ALBUMIN 2.9* 2.7* 2.7*   CBC:  Recent Labs Lab 07/14/14 1131 07/15/14 0612  WBC 6.9 7.0  HGB 6.1* 6.1*  HCT 18.3* 18.5*  MCV 89.3 90.7  PLT 229 245    CBG:  Recent Labs Lab 07/12/14 1537  GLUCAP 88    Micro Results: Recent Results (from the past 240 hour(s))  MRSA PCR Screening     Status: None   Collection Time: 07/12/14  2:00 PM  Result Value Ref Range Status   MRSA by PCR NEGATIVE NEGATIVE Final    Comment:        The GeneXpert MRSA Assay (FDA approved for NASAL specimens only), is one component of a comprehensive MRSA colonization surveillance program. It is not intended to diagnose MRSA infection nor to guide or monitor treatment for MRSA infections.    Studies/Results: No results found. Medications: I have reviewed the patient's current medications. Scheduled Meds: . atorvastatin  10 mg Oral q1800  . calcium acetate  3,335 mg Oral TID WC  . cinacalcet  120 mg Oral Q supper  . [START ON 07/16/2014] darbepoetin (ARANESP) injection - DIALYSIS  150 mcg Intravenous Q Fri-HD  . multivitamin  1 tablet Oral QHS  . peg 3350 powder  0.5 kit Oral Once  . sodium chloride  3 mL Intravenous Q12H   Continuous Infusions:  PRN Meds:. Assessment/Plan: Principal Problem:   Lower GI bleed Active Problems:   HTN (hypertension)  ESRD on dialysis   Refusal of blood transfusions as patient is Jehovah's Witness   Diverticulosis   Secondary hyperparathyroidism  Lower GI Bleed: Seems resolved clinically with stable hemoglobin at 6.1. Remains hemodynamically stable. Still considering the ethics of blood transfusion. Was extensively counseled on the extreme risks of morbidity and mortality of not accepting blood transfusion at his current hemoglobin levels. -If develops brisk bleeding, IR for mesenteric arteriogram -Outpatient colonoscopy when hemoglobin greater than 8.5 -Recheck CBC in a.m. -Continue to revisit possibility of blood transfusion -Check orthostatics  Acute on chronic anemia: Hemoglobin stable at 6.1. -Aranesp -Anemia panel unremarkable although results biased by recent administration of IV iron.  HTN:  Currently normotensive. -Hold home metoprolol 25 mg twice a day  ESRD on HD MWF -Renal following, appreciate recommendations and management of dialysis -Continue home calcium acetate 3335 mg 3 times a day, Sensipar 120 mg daily, lanthanum 1000 mg 3 times a day  Hyperlipidemia: Continue home atorvastatin 10 mg daily at bedtime  FEN: Renal diet  VTE ppx: SCDs  Dispo: Disposition is deferred at this time, awaiting improvement of current medical problems.  Anticipated discharge in approximately 1-2 day(s).   The patient does have a current PCP Jamal Maes, MD) and does not need an [facility] hospital follow-up appointment after discharge.  The patient does not have transportation limitations that hinder transportation to clinic appointments.  .Services Needed at time of discharge: Y = Yes, Blank = No PT:   OT:   RN:   Equipment:   Other:     LOS: 3 days   Luan Moore, MD 07/15/2014, 11:39 AM

## 2014-07-16 ENCOUNTER — Encounter (HOSPITAL_COMMUNITY): Payer: Self-pay | Admitting: *Deleted

## 2014-07-16 DIAGNOSIS — D6489 Other specified anemias: Secondary | ICD-10-CM

## 2014-07-16 LAB — RENAL FUNCTION PANEL
Albumin: 2 g/dL — ABNORMAL LOW (ref 3.5–5.0)
Albumin: 2.6 g/dL — ABNORMAL LOW (ref 3.5–5.0)
Anion gap: 10 (ref 5–15)
Anion gap: 14 (ref 5–15)
BUN: 24 mg/dL — ABNORMAL HIGH (ref 6–20)
BUN: 60 mg/dL — ABNORMAL HIGH (ref 6–20)
CO2: 25 mmol/L (ref 22–32)
CO2: 26 mmol/L (ref 22–32)
Calcium: 7.6 mg/dL — ABNORMAL LOW (ref 8.9–10.3)
Calcium: 8.1 mg/dL — ABNORMAL LOW (ref 8.9–10.3)
Chloride: 100 mmol/L — ABNORMAL LOW (ref 101–111)
Chloride: 97 mmol/L — ABNORMAL LOW (ref 101–111)
Creatinine, Ser: 9.24 mg/dL — ABNORMAL HIGH (ref 0.61–1.24)
Creatinine, Ser: 12.26 mg/dL — ABNORMAL HIGH (ref 0.61–1.24)
GFR calc Af Amer: 7 mL/min — ABNORMAL LOW (ref >60)
GFR calc Af Amer: 5 mL/min — ABNORMAL LOW (ref >60)
GFR calc non Af Amer: 6 mL/min — ABNORMAL LOW (ref >60)
GFR calc non Af Amer: 4 mL/min — ABNORMAL LOW (ref >60)
Glucose, Bld: 78 mg/dL (ref 65–99)
Glucose, Bld: 113 mg/dL — ABNORMAL HIGH (ref 65–99)
Phosphorus: 2.8 mg/dL (ref 2.5–4.6)
Phosphorus: 5.8 mg/dL — ABNORMAL HIGH (ref 2.5–4.6)
Potassium: 3.5 mmol/L (ref 3.5–5.1)
Potassium: 5.1 mmol/L (ref 3.5–5.1)
Sodium: 135 mmol/L (ref 135–145)
Sodium: 137 mmol/L (ref 135–145)

## 2014-07-16 LAB — CBC
HCT: 17.2 % — ABNORMAL LOW (ref 39.0–52.0)
HCT: 18.4 % — ABNORMAL LOW (ref 39.0–52.0)
Hemoglobin: 5.7 g/dL — CL (ref 13.0–17.0)
Hemoglobin: 6.1 g/dL — CL (ref 13.0–17.0)
MCH: 30.2 pg (ref 26.0–34.0)
MCH: 30.2 pg (ref 26.0–34.0)
MCHC: 33.1 g/dL (ref 30.0–36.0)
MCHC: 33.2 g/dL (ref 30.0–36.0)
MCV: 91 fL (ref 78.0–100.0)
MCV: 91.1 fL (ref 78.0–100.0)
Platelets: 247 10*3/uL (ref 150–400)
Platelets: 261 10*3/uL (ref 150–400)
RBC: 1.89 MIL/uL — ABNORMAL LOW (ref 4.22–5.81)
RBC: 2.02 MIL/uL — ABNORMAL LOW (ref 4.22–5.81)
RDW: 16.1 % — ABNORMAL HIGH (ref 11.5–15.5)
RDW: 15.8 % — ABNORMAL HIGH (ref 11.5–15.5)
WBC: 7.2 10*3/uL (ref 4.0–10.5)
WBC: 7.8 10*3/uL (ref 4.0–10.5)

## 2014-07-16 LAB — PREPARE RBC (CROSSMATCH)

## 2014-07-16 LAB — NO BLOOD PRODUCTS

## 2014-07-16 MED ORDER — SODIUM CHLORIDE 0.9 % IV SOLN: 100.0000 mL | INTRAVENOUS | Status: DC | PRN

## 2014-07-16 MED ORDER — PENTAFLUOROPROP-TETRAFLUOROETH EX AERO: 1.0000 "application " | INHALATION_SPRAY | CUTANEOUS | Status: DC | PRN

## 2014-07-16 MED ORDER — SODIUM CHLORIDE 0.9 % IV SOLN: Freq: Once | INTRAVENOUS | Status: DC

## 2014-07-16 MED ORDER — DARBEPOETIN ALFA 200 MCG/0.4ML IJ SOSY
PREFILLED_SYRINGE | INTRAMUSCULAR | Status: AC
  Filled 2014-07-16: qty 0.4

## 2014-07-16 MED ORDER — LIDOCAINE-PRILOCAINE 2.5-2.5 % EX CREA
1.0000 "application " | TOPICAL_CREAM | CUTANEOUS | Status: DC | PRN
  Filled 2014-07-16: qty 5

## 2014-07-16 MED ORDER — DARBEPOETIN ALFA 200 MCG/0.4ML IJ SOSY
200.0000 ug | PREFILLED_SYRINGE | INTRAMUSCULAR | Status: DC
  Administered 2014-07-16: 200 ug via INTRAVENOUS

## 2014-07-16 MED ORDER — ALTEPLASE 2 MG IJ SOLR
2.0000 mg | Freq: Once | INTRAMUSCULAR | Status: DC | PRN
  Filled 2014-07-16: qty 2

## 2014-07-16 MED ORDER — LIDOCAINE HCL (PF) 1 % IJ SOLN: 5.0000 mL | INTRAMUSCULAR | Status: DC | PRN

## 2014-07-16 MED ORDER — NEPRO/CARBSTEADY PO LIQD
237.0000 mL | ORAL | Status: DC | PRN
  Filled 2014-07-16: qty 237

## 2014-07-16 NOTE — Progress Notes (Signed)
Subjective:  No hematochezia for 3 days now. No significant dizziness or chest pain. Still against receiving blood transfusion.  Objective:  Vital signs in last 24 hours: Filed Vitals:   07/16/14 1230 07/16/14 1300 07/16/14 1330 07/16/14 1405  BP: 108/57 128/68 91/47 110/57  Pulse: 107 100 88 106  Temp:    98.5 F (36.9 C)  TempSrc:    Oral  Resp: '16 16 18 18  ' Height:      Weight:      SpO2:    100%   Weight change: 12.5 oz (0.355 kg)  Intake/Output Summary (Last 24 hours) at 07/16/14 1659 Last data filed at 07/16/14 1405  Gross per 24 hour  Intake      0 ml  Output   2000 ml  Net  -2000 ml   General Apperance: NAD HEENT: Normocephalic, atraumatic, PERRL, EOMI, anicteric sclera, conjunctiva pallor Neck: Supple, trachea midline Lungs: Clear to auscultation bilaterally. No wheezes, rhonchi or rales. Breathing comfortably. Heart: Regular rate and rhythm, no murmur/rub/gallop Abdomen: Soft, nontender, nondistended, no rebound/guarding Extremities: Normal, atraumatic, warm and well perfused Pulses: 2+ throughout Skin: No rashes or lesions Neurologic: Alert and oriented x 3. CNII-XII intact. Normal strength and sensation  Lab Results: Basic Metabolic Panel:  Recent Labs Lab 07/16/14 0500 07/16/14 0945  NA 135 137  K 3.5 5.1  CL 100* 97*  CO2 25 26  GLUCOSE 78 113*  BUN 24* 60*  CREATININE 9.24* 12.26*  CALCIUM 7.6* 8.1*  PHOS 2.8 5.8*   Liver Function Tests:  Recent Labs Lab 07/12/14 1030  07/16/14 0500 07/16/14 0945  AST 14*  --   --   --   ALT 10*  --   --   --   ALKPHOS 62  --   --   --   BILITOT 0.5  --   --   --   PROT 7.1  --   --   --   ALBUMIN 2.9*  < > 2.0* 2.6*  < > = values in this interval not displayed. CBC:  Recent Labs Lab 07/16/14 0945 07/16/14 1535  WBC 7.2 7.8  HGB 5.7* 6.1*  HCT 17.2* 18.4*  MCV 91.0 91.1  PLT 247 261   CBG:  Recent Labs Lab 07/12/14 1537  GLUCAP 88    Micro Results: Recent Results (from the  past 240 hour(s))  MRSA PCR Screening     Status: None   Collection Time: 07/12/14  2:00 PM  Result Value Ref Range Status   MRSA by PCR NEGATIVE NEGATIVE Final    Comment:        The GeneXpert MRSA Assay (FDA approved for NASAL specimens only), is one component of a comprehensive MRSA colonization surveillance program. It is not intended to diagnose MRSA infection nor to guide or monitor treatment for MRSA infections.    Studies/Results: No results found. Medications: I have reviewed the patient's current medications. Scheduled Meds: . sodium chloride   Intravenous Once  . atorvastatin  10 mg Oral q1800  . calcium acetate  3,335 mg Oral TID WC  . cinacalcet  120 mg Oral Q supper  . darbepoetin (ARANESP) injection - DIALYSIS  200 mcg Intravenous Q Fri-HD  . multivitamin  1 tablet Oral QHS  . peg 3350 powder  0.5 kit Oral Once  . sodium chloride  3 mL Intravenous Q12H   Continuous Infusions:  PRN Meds:. Assessment/Plan: Principal Problem:   Lower GI bleed Active Problems:   HTN (hypertension)  ESRD on dialysis   Refusal of blood transfusions as patient is Jehovah's Witness   Diverticulosis   Secondary hyperparathyroidism  Lower GI Bleed: Hgb down to 5.7 but back up to 6.1 today. Remains hemodynamically stable. Still considering the ethics of blood transfusion. Was extensively counseled on the extreme risks of morbidity and mortality of not accepting blood transfusion at his current hemoglobin levels. -If develops brisk bleeding, IR for mesenteric arteriogram -Outpatient colonoscopy when hemoglobin greater than 8.5 -Recheck CBC in a.m. -Continue to revisit possibility of blood transfusion -Check orthostatics -Plan for discharge tomorrow   Acute on chronic anemia: Hemoglobin stable at 6.1. -Aranesp -Anemia panel unremarkable although results biased by recent administration of IV iron.  HTN: Currently normotensive. -Hold home metoprolol 25 mg twice a day  ESRD on  HD MWF -Renal following, appreciate recommendations and management of dialysis -Continue home calcium acetate 3335 mg 3 times a day, Sensipar 120 mg daily, lanthanum 1000 mg 3 times a day  Hyperlipidemia: Continue home atorvastatin 10 mg daily at bedtime  FEN: Renal diet  VTE ppx: SCDs  Dispo: Disposition is deferred at this time, awaiting improvement of current medical problems.  Anticipated discharge in approximately 1 day(s).   The patient does have a current PCP Jamal Maes, MD) and does not need an [facility] hospital follow-up appointment after discharge.  The patient does not have transportation limitations that hinder transportation to clinic appointments.  .Services Needed at time of discharge: Y = Yes, Blank = No PT:   OT:   RN:   Equipment:   Other:     LOS: 4 days   Luan Moore, MD 07/16/2014, 4:59 PM

## 2014-07-16 NOTE — Progress Notes (Signed)
Subjective:  No cos for hd today , walking halls not dizzy, last stool" no blood" brown  Objective Vital signs in last 24 hours: Filed Vitals:   07/15/14 0944 07/15/14 1822 07/15/14 2111 07/16/14 0454  BP: 116/75 118/73 118/74 115/72  Pulse: 100 91 100 100  Temp: 98.6 F (37 C) 98.2 F (36.8 C) 98.5 F (36.9 C) 98.4 F (36.9 C)  TempSrc: Oral Oral Oral Oral  Resp: '20 20 16 16  ' Height:      Weight:    108.455 kg (239 lb 1.6 oz)  SpO2: 100% 96% 100% 100%   Weight change: 0.355 kg (12.5 oz)  Physical Exam: General:Alert pleasant  , NAD , Cooperative OX3 Heart: RRR, no mur, No rub  Lungs: CTA bilat Abdomen: BS pos, soft, NT Extremities: no  pedal edema, Dialysis Access: Pos. Bruit R U A AVF  Dialysis Orders: [facility] . [facility]W 107 HD Bath 1k,2.25ca Time 4hr 30 min Heparin 2100. Access RA AVF  Hectorol 3 mcg IV/HD Mircera 79mg q 2 weeks 07/07/14 given hd Units IV/HD Venofer 50 mg q wkly hd  Other op labs 07/07/14 ca 9.2/phos 6.5 pth 599  Problem/Plan: 1. Rectal bleeding / anemia - bleeding stopped. Hb remains low 6.1 today. Refuses transfusion ([facility] unless life-threatening bleed. Increase to max esa w darbe 200/wk, first dose today. Fe stores are replete w tsat 85%.  2. ESRD - HD MWF ([facility]) no heparin. HD today / RENAL TX  LIST ACTIVE  ON HOLD TILL HGB STABLE/Also needs Neuro apt from last [place]/  Will notify [facility]   3. Hypertension/volume - bp 115/72/ vol okay  Only 1.5 over edw this am pre hd HD   4. Metabolic bone disease -C Ca+ 9.34. Phos 5.4. Phoslo and sensipar as ordered. 5. Nutrition - Renal diet. Albumin 2.7. Added yest. nepro / on  renal vit  DErnest Haber PA-C [facility] [phone]/04/2014,8:24 AM  LOS: 4 days   RKelly SplinterMD pager [phone]   cell [phone]/04/2014, 9:59 AM    Labs: Basic Metabolic Panel:  Recent Labs Lab 07/13/14 1355 07/14/14 0500  07/15/14 0612  NA 141 138 137  K 6.2* 5.7* 5.4*  CL 99* 96* 96*  CO2 20* 22 27  GLUCOSE 92 91 85  BUN 104* 109* 50*  CREATININE 14.24* 15.58* 10.17*  CALCIUM 8.4* 8.5* 8.3*  PHOS  --  5.5* 5.4*   Liver Function Tests:  Recent Labs Lab 07/12/14 1030 07/14/14 0500 07/15/14 0612  AST 14*  --   --   ALT 10*  --   --   ALKPHOS 62  --   --   BILITOT 0.5  --   --   PROT 7.1  --   --   ALBUMIN 2.9* 2.7* 2.7*  CBC:  Recent Labs Lab 07/12/14 1740 07/12/14 2312 07/14/14 0500 07/14/14 1131 07/15/14 0612  WBC 10.6* 10.9* 8.5 6.9 7.0  HGB 6.9* 6.8* 5.9* 6.1* 6.1*  HCT 21.2* 20.2* 17.7* 18.3* 18.5*  MCV 89.5 89.8 88.9 89.3 90.7  PLT 144* 226 234 229 245    Recent Labs Lab 07/12/14 1537  GLUCAP 88  Medications:   . atorvastatin  10 mg Oral q1800  . calcium acetate  3,335 mg Oral TID WC  . cinacalcet  120 mg Oral Q supper  . darbepoetin (ARANESP) injection - DIALYSIS  150 mcg Intravenous Q Fri-HD  . multivitamin  1 tablet Oral QHS  . peg 3350 powder  0.5 kit Oral Once  . sodium chloride  3 mL Intravenous Q12H      \

## 2014-07-16 NOTE — Progress Notes (Signed)
Pt HD complete with no complications. Pt alert, vss post treatment. 2L fluid removed. Report called. Patient returned safely to room.

## 2014-07-16 NOTE — Progress Notes (Signed)
Pt refusing blood transfusion. Says he wants to wait one more day. PA paged, blood bank notified.

## 2014-07-16 NOTE — Progress Notes (Signed)
Patient seen and examined. Case d/w residents in detail. I agree with findings and plan as documented in Dr. Trudee Kuster note.  Hg is stable with no further GI bleed. He is still deciding on whether he wants to receive blood or not. Recheck CBC in AM. If stable may be able to d/c home over weekend.

## 2014-07-17 LAB — CBC
HCT: 19.8 % — ABNORMAL LOW (ref 39.0–52.0)
Hemoglobin: 6.4 g/dL — CL (ref 13.0–17.0)
MCH: 29.6 pg (ref 26.0–34.0)
MCHC: 32.3 g/dL (ref 30.0–36.0)
MCV: 91.7 fL (ref 78.0–100.0)
Platelets: 287 10*3/uL (ref 150–400)
RBC: 2.16 MIL/uL — ABNORMAL LOW (ref 4.22–5.81)
RDW: 16.2 % — ABNORMAL HIGH (ref 11.5–15.5)
WBC: 8.3 10*3/uL (ref 4.0–10.5)

## 2014-07-17 LAB — TYPE AND SCREEN
ABO/RH(D): O POS
Antibody Screen: NEGATIVE

## 2014-07-17 MED ORDER — DARBEPOETIN ALFA 200 MCG/0.4ML IJ SOSY
200.0000 ug | PREFILLED_SYRINGE | INTRAMUSCULAR | Status: DC
Start: 2014-07-17 — End: 2015-04-27

## 2014-07-17 NOTE — Progress Notes (Signed)
Subjective:  No cos tolerated hd/ up ambulating  / still refusing transfusion sec .Lazarus Salines.  Objective Vital signs in last 24 hours: Filed Vitals:   07/16/14 1330 07/16/14 1405 07/16/14 2140 07/17/14 0514  BP: 91/47 110/57 101/74 109/66  Pulse: 88 106 108 96  Temp:  98.5 F (36.9 C) 98.4 F (36.9 C) 98.5 F (36.9 C)  TempSrc:  Oral Oral Oral  Resp: '18 18 18 16  ' Height:      Weight:    107.8 kg (237 lb 10.5 oz)  SpO2:  100% 100% 100%   Weight change: -0.655 kg (-1 lb 7.1 oz) Physical Exam: General:Alert pleasant , NAD , Cooperative OX3 Heart: RRR, no mur, No rub  Lungs: CTA bilat Abdomen: BS pos, soft, NT Extremities: no pedal edema, Dialysis Access: Pos. Bruit R U A AVF  Dialysis Orders: [facility] MWF . EDW 107 HD Bath 1k,2.25ca Time 4hr 30 min Heparin 2100. Access RA AVF  Hectorol 3 mcg IV/HD Mircera 74mg q 2 weeks 07/07/14 given hd Units IV/HD Venofer 50 mg q wkly hd  Other op labs 07/07/14 ca 9.2/phos 6.5 pth 599  Problem/Plan: 1. Rectal bleeding / anemia -"last stool brown " Hgb pre hd yest. 5.7> post hd 6.1/ hgb  Today pending. Refuses transfusion ([facility] unless life-threatening bleed. Increased to max esa w darbe 200/wk,  Fe stores are replete w tsat 85%.  2. ESRD - HD MWF ([facility]) no heparin. HD today / RENAL TX LIST ACTIVE ON HOLD TILL HGB STABLE/Also needs Neuro apt from last admit [place]/I dw pt today he will arrange.  3. Hypertension/volume - bp 109/66 vol okay  4. Metabolic bone disease -Correc Ca+ 9.2. Phos 5.8. Phoslo and sensipar . 5. Nutrition - Renal diet. Albumin 2.6.on  nepro / on renal vit   DErnest Haber PA-C [facility] [phone]/05/2014,8:11 AM  LOS: 5 days   Pt seen, examined, agree w assess/plan as above with additions as indicated.  RKelly SplinterMD pager [phone]   cell [phone]/05/2014, 1:08 PM    Labs: Basic Metabolic Panel:  Recent Labs Lab  07/15/14 0612 07/16/14 0500 07/16/14 0945  NA 137 135 137  K 5.4* 3.5 5.1  CL 96* 100* 97*  CO2 '27 25 26  ' GLUCOSE 85 78 113*  BUN 50* 24* 60*  CREATININE 10.17* 9.24* 12.26*  CALCIUM 8.3* 7.6* 8.1*  PHOS 5.4* 2.8 5.8*   Liver Function Tests:  Recent Labs Lab 07/12/14 1030  07/15/14 0612 07/16/14 0500 07/16/14 0945  AST 14*  --   --   --   --   ALT 10*  --   --   --   --   ALKPHOS 62  --   --   --   --   BILITOT 0.5  --   --   --   --   PROT 7.1  --   --   --   --   ALBUMIN 2.9*  < > 2.7* 2.0* 2.6*  < > = values in this interval not displayed. No results for input(s): LIPASE, AMYLASE in the last 168 hours. No results for input(s): AMMONIA in the last 168 hours. CBC:  Recent Labs Lab 07/14/14 0500 07/14/14 1131 07/15/14 0612 07/16/14 0945 07/16/14 1535  WBC 8.5 6.9 7.0 7.2 7.8  HGB 5.9* 6.1* 6.1* 5.7* 6.1*  HCT 17.7* 18.3* 18.5* 17.2* 18.4*  MCV 88.9 89.3 90.7 91.0 91.1  PLT 234 229 245 247 261   CBG:  Recent Labs Lab 07/12/14 1537  GLUCAP 88   Medications:   . sodium chloride   Intravenous Once  . atorvastatin  10 mg Oral q1800  . calcium acetate  3,335 mg Oral TID WC  . cinacalcet  120 mg Oral Q supper  . darbepoetin (ARANESP) injection - DIALYSIS  200 mcg Intravenous Q Fri-HD  . multivitamin  1 tablet Oral QHS  . peg 3350 powder  0.5 kit Oral Once  . sodium chloride  3 mL Intravenous Q12H

## 2014-07-17 NOTE — Discharge Summary (Signed)
Name: Carl Kobak Sr. MRN: [id number] DOB: February 11, 1961 54 y.o. PCP: Carl Maes, MD  Date of Admission: 07/12/2014 10:04 AM Date of Discharge: 07/17/2014 Attending Physician: Dr. Aldine [name]  Discharge Diagnosis: Principal Problem:   Lower GI bleed Active Problems:   HTN (hypertension)   ESRD on dialysis   Refusal of blood transfusions as patient is Jehovah's Witness   Diverticulosis   Secondary hyperparathyroidism  Discharge Medications:   Medication List    STOP taking these medications        aspirin 325 MG tablet      TAKE these medications        atorvastatin 10 MG tablet  Commonly known as:  LIPITOR  Take 1 tablet (10 mg total) by mouth daily at 6 PM.     calcium acetate 667 MG capsule  Commonly known as:  PHOSLO  Take 3,335 mg by mouth 3 (three) times daily with meals.     cinacalcet 60 MG tablet  Commonly known as:  SENSIPAR  Take 120 mg by mouth daily.     Darbepoetin Alfa 200 MCG/0.4ML Sosy injection  Commonly known as:  ARANESP  Inject 0.4 mLs (200 mcg total) into the vein every Friday with hemodialysis.     gabapentin 300 MG capsule  Commonly known as:  NEURONTIN  Take 300 mg by mouth daily.     ibuprofen 800 MG tablet  Commonly known as:  ADVIL,MOTRIN  Take 800 mg by mouth 2 (two) times daily.     lanthanum 1000 MG chewable tablet  Commonly known as:  FOSRENOL  Chew 1,000 mg by mouth 3 (three) times daily with meals.     metoprolol tartrate 25 MG tablet  Commonly known as:  LOPRESSOR  Take 1 tablet (25 mg total) by mouth 2 (two) times daily.     MULTI VITAMIN MENS PO  Take 1 tablet by mouth daily.     sorbitol 70 % solution  Take 15-60 mLs by mouth daily as needed (dialysis days).        Disposition and follow-up:   Carl Julian Sr. was discharged from [facility] in Serious condition.  At the hospital follow up visit please address:  Lower GI Bleed: Would continue to monitor blood levels and refer  to gastroenterology once hemoglobin has recovered to greater than 8.5.  Acute on chronic anemia: Would follow-up hemoglobin levels at dialysis. Would revisit possibility of blood transfusion per patient and family preference.  2.  Labs / imaging needed at time of follow-up: Hemoglobin/hematocrit  3.  Pending labs/ test needing follow-up: none   Follow-up Appointments: Regular dialysis [name] PCP  Discharge Instructions:     Discharge Instructions    Call MD for:  difficulty breathing, headache or visual disturbances    Complete by:  As directed      Call MD for:  extreme fatigue    Complete by:  As directed      Call MD for:  hives    Complete by:  As directed      Call MD for:  persistant dizziness or light-headedness    Complete by:  As directed      Call MD for:  persistant nausea and vomiting    Complete by:  As directed      Call MD for:  redness, tenderness, or signs of infection (pain, swelling, redness, odor or green/yellow discharge around incision site)    Complete by:  As directed  Call MD for:  severe uncontrolled pain    Complete by:  As directed      Call MD for:  temperature >100.4    Complete by:  As directed      Diet - low sodium heart healthy    Complete by:  As directed      Increase activity slowly    Complete by:  As directed            Consultations: Treatment Team:  Carl Jaffe, MD  Procedures Performed:  US Biopsy  07/01/2014   CLINICAL DATA:  Enlarging nontender nonpalpable left parotid nodule on recent MR.  EXAM: ULTRASOUND GUIDED FINE-NEEDLE ASPIRATION BIOPSY OF LEFT PAROTID NODULE  MEDICATIONS: Lidocaine 1% subcutaneous  PROCEDURE: The procedure, risks, benefits, and alternatives were explained to the patient. Questions regarding the procedure were encouraged and answered. The patient understands and consents to the procedure.  Survey ultrasound demonstrated a hypervascular hypoechoic nodule corresponding to the lesion on  previous imaging. An appropriate skin entry site was determined and marked.  The operative field was prepped with Betadine in a sterile fashion, and a sterile drape was applied covering the operative field. A sterile gown and sterile gloves were used for the procedure. Local anesthesia was provided with 1% Lidocaine.  Under real-time ultrasound guidance, three 21 gauge Inrad needles were utilized to obtain fine-needle aspiration samples under ultrasound guidance, submitted to cytopathology. The guide needle was removed. Postprocedure scans show no hematoma or other apparent complication.  COMPLICATIONS: None.  FINDINGS: 2.1 cm hypoechoic sharply demarcated left parotid nodule corresponding to recent findings. Ultrasound-guided fine needle aspiration biopsy samples were obtained.  IMPRESSION: 1. Technically successful ultrasound-guided FNA biopsy of left parotid nodule.   Electronically Signed   By: [name] M.D.   On: 07/01/2014 18:20    2D Echo: none  Cardiac Cath: none  Admission HPI:   Mr. Carl Mathe Sr. is a 54 year old man with history of HTN, chronic anemia, ESRD on HD MWF presenting with blood per rectum. He reports he first noticed bloody stools 1 week ago. The bleeding stopped two days ago, but started again this morning. He reports diarrhea and some lightheadedness. Denies nausea, vomiting, hemetemesis, abdominal pain, melena. He reports he has a history of diverticulosis and hemorrhoids. He reports his last colonoscopy was about a year ago with no significant findings.  Hospital Course by problem list: Principal Problem:   Lower GI bleed Active Problems:   HTN (hypertension)   ESRD on dialysis   Refusal of blood transfusions as patient is Jehovah's Witness   Diverticulosis   Secondary hyperparathyroidism   Lower GI Bleed: Patient with a history of diverticular disease presenting with lower GI bleed. Patient remained hemodynamically stable. Gastroenterology was consulted but did  not proceed with a colonoscopy because of profound anemia. According to their plan, hemoglobin of greater than 8.5 was required to proceed with colonoscopy. A tagged red blood cell scan was performed in the setting of profound drop in hemoglobin showing very distal active hemorrhage. This was not thought to be intervenable given the nature of the collateral vasculature in the distal rectum. However, patient's bleeding spontaneously resolved. Management of profound anemia was limited as described below. Would continue to monitor blood levels and refer to gastroenterology once hemoglobin has recovered to greater than 8.5.  Acute on chronic anemia: Previous hemoglobin prior to this admission of 10.5 on 07/01/2014. There is a drop in hemoglobin to 6.1 upon initial admission. Patient is a Sales promotion account executive Witness  and refused transfusion because of this. Patient was started on IV iron and Aranesp. Patient's hemoglobin remained relatively stable throughout his admission ranging between 5.7 and 6.9. Patient was counseled on the risks of foregoing blood transfusion every day of his admission. Patient considered the possibility of blood transfusion in concert with his family on an almost daily basis. Upon the day of discharge, patient had a period of 4 days without any evidence of recurrent lower GI bleed and continued to refuse blood transfusion. Patient is to follow-up as an outpatient with hemodialysis regarding blood counts. Would follow-up hemoglobin levels at dialysis. Would revisit possibility of blood transfusion per patient and family preference.  HTN: Patient remained normotensive throughout his admission. His metoprolol was held in the setting of GI bleed but was resumed upon discharge.  ESRD: Patient was continued on dialysis on his normal schedule of Monday Wednesday and Friday. He continued his home regimen of calcium acetate, Sensipar, and lanthanum.  Hyperlipidemia: Continued on home atorvastatin 10 mg daily  at bedtime.  Discharge Vitals:   BP 113/79 mmHg  Pulse 116  Temp(Src) 98.6 F (37 C) (Oral)  Resp 16  Ht 6\' 2"  (1.88 m)  Wt 237 lb 10.5 oz (107.8 kg)  BMI 30.50 kg/m2  SpO2 100%  Discharge Labs:  Results for orders placed or performed during the hospital encounter of 07/12/14 (from the past 24 hour(s))  No blood products     Status: None   Collection Time: 07/16/14  6:10 PM  Result Value Ref Range   Transfuse no blood products      TRANSFUSE NO BLOOD PRODUCTS, VERIFIED BY WENDI GWALTNEY RN   CBC     Status: Abnormal   Collection Time: 07/17/14  8:42 AM  Result Value Ref Range   WBC 8.3 4.0 - 10.5 K/uL   RBC 2.16 (L) 4.22 - 5.81 MIL/uL   Hemoglobin 6.4 (LL) 13.0 - 17.0 g/dL   HCT 19.8 (L) 39.0 - 52.0 %   MCV 91.7 78.0 - 100.0 fL   MCH 29.6 26.0 - 34.0 pg   MCHC 32.3 30.0 - 36.0 g/dL   RDW 16.2 (H) 11.5 - 15.5 %   Platelets 287 150 - 400 K/uL    Signed: Luan Moore, MD 07/17/2014, 4:48 PM    Services Ordered on Discharge: none Equipment Ordered on Discharge: none

## 2014-07-17 NOTE — Progress Notes (Signed)
Subjective:  No hematochezia for 4 days now. No significant dizziness or chest pain. Still against receiving blood transfusion.  Objective:  Vital signs in last 24 hours: Filed Vitals:   07/16/14 1405 07/16/14 2140 07/17/14 0514 07/17/14 0900  BP: 110/57 101/74 109/66 113/79  Pulse: 106 108 96 116  Temp: 98.5 F (36.9 C) 98.4 F (36.9 C) 98.5 F (36.9 C) 98.6 F (37 C)  TempSrc: Oral Oral Oral Oral  Resp: 18 18 16 16   Height:      Weight:   237 lb 10.5 oz (107.8 kg)   SpO2: 100% 100% 100% 100%   Weight change: -1 lb 7.1 oz (-0.655 kg)  Intake/Output Summary (Last 24 hours) at 07/17/14 1549 Last data filed at 07/17/14 0851  Gross per 24 hour  Intake   1440 ml  Output      0 ml  Net   1440 ml   General Apperance: NAD HEENT: Normocephalic, atraumatic, PERRL, EOMI, anicteric sclera, conjunctiva pallor Neck: Supple, trachea midline Lungs: Clear to auscultation bilaterally. No wheezes, rhonchi or rales. Breathing comfortably. Heart: Regular rate and rhythm, no murmur/rub/gallop Abdomen: Soft, nontender, nondistended, no rebound/guarding Extremities: Normal, atraumatic, warm and well perfused Pulses: 2+ throughout Skin: No rashes or lesions Neurologic: Alert and oriented x 3. CNII-XII intact. Normal strength and sensation  Lab Results: Basic Metabolic Panel:  Recent Labs Lab 07/16/14 0500 07/16/14 0945  NA 135 137  K 3.5 5.1  CL 100* 97*  CO2 25 26  GLUCOSE 78 113*  BUN 24* 60*  CREATININE 9.24* 12.26*  CALCIUM 7.6* 8.1*  PHOS 2.8 5.8*   Liver Function Tests:  Recent Labs Lab 07/12/14 1030  07/16/14 0500 07/16/14 0945  AST 14*  --   --   --   ALT 10*  --   --   --   ALKPHOS 62  --   --   --   BILITOT 0.5  --   --   --   PROT 7.1  --   --   --   ALBUMIN 2.9*  < > 2.0* 2.6*  < > = values in this interval not displayed. CBC:  Recent Labs Lab 07/16/14 1535 07/17/14 0842  WBC 7.8 8.3  HGB 6.1* 6.4*  HCT 18.4* 19.8*  MCV 91.1 91.7  PLT 261 287     CBG:  Recent Labs Lab 07/12/14 1537  GLUCAP 88    Micro Results: Recent Results (from the past 240 hour(s))  MRSA PCR Screening     Status: None   Collection Time: 07/12/14  2:00 PM  Result Value Ref Range Status   MRSA by PCR NEGATIVE NEGATIVE Final    Comment:        The GeneXpert MRSA Assay (FDA approved for NASAL specimens only), is one component of a comprehensive MRSA colonization surveillance program. It is not intended to diagnose MRSA infection nor to guide or monitor treatment for MRSA infections.    Studies/Results: No results found. Medications: I have reviewed the patient's current medications. Scheduled Meds: . sodium chloride   Intravenous Once  . atorvastatin  10 mg Oral q1800  . calcium acetate  3,335 mg Oral TID WC  . cinacalcet  120 mg Oral Q supper  . darbepoetin (ARANESP) injection - DIALYSIS  200 mcg Intravenous Q Fri-HD  . multivitamin  1 tablet Oral QHS   Continuous Infusions:  PRN Meds:. Assessment/Plan: Principal Problem:   Lower GI bleed Active Problems:   HTN (hypertension)  ESRD on dialysis   Refusal of blood transfusions as patient is Jehovah's Witness   Diverticulosis   Secondary hyperparathyroidism  Lower GI Bleed: Hemoglobin up to 6.4 from 6.1 yesterday. Remains hemodynamically stable. Still considering the ethics of blood transfusion and may consider a transfusion if he has not improved by Monday or Tuesday. Was extensively counseled on the extreme risks of morbidity and mortality of not accepting blood transfusion at his current hemoglobin levels. -Discharge patient with return precautions, patient to follow-up hemoglobin at hemodialysis. -Outpatient colonoscopy when hemoglobin greater than 8.5 -Continue to revisit possibility of blood transfusion  Acute on chronic anemia: Hemoglobin up to 6.4 from 6.1 yesterday. -Aranesp -Anemia panel unremarkable although results biased by recent administration of IV iron.  HTN:  Currently normotensive. -Hold home metoprolol 25 mg twice a day  ESRD on HD MWF -Renal following, appreciate recommendations and management of dialysis -Continue home calcium acetate 3335 mg 3 times a day, Sensipar 120 mg daily, lanthanum 1000 mg 3 times a day  Hyperlipidemia: Continue home atorvastatin 10 mg daily at bedtime  FEN: Renal diet  VTE ppx: SCDs  Dispo: Today  The patient does have a current PCP Jamal Maes, MD) and does not need an [facility] hospital follow-up appointment after discharge.  The patient does not have transportation limitations that hinder transportation to clinic appointments.  .Services Needed at time of discharge: Y = Yes, Blank = No PT:   OT:   RN:   Equipment:   Other:     LOS: 5 days   Luan Moore, MD 07/17/2014, 3:49 PM

## 2014-07-17 NOTE — Progress Notes (Signed)
07/17/2014  12:52 PM  [name] to be D/C'd Home per MD order.  Discussed prescriptions and follow up appointments with the patient. No prescriptions given to patient, medication list explained in detail. Pt verbalized understanding.    Medication List    STOP taking these medications        aspirin 325 MG tablet      TAKE these medications        atorvastatin 10 MG tablet  Commonly known as:  LIPITOR  Take 1 tablet (10 mg total) by mouth daily at 6 PM.     calcium acetate 667 MG capsule  Commonly known as:  PHOSLO  Take 3,335 mg by mouth 3 (three) times daily with meals.     cinacalcet 60 MG tablet  Commonly known as:  SENSIPAR  Take 120 mg by mouth daily.     Darbepoetin Alfa 200 MCG/0.4ML Sosy injection  Commonly known as:  ARANESP  Inject 0.4 mLs (200 mcg total) into the vein every Friday with hemodialysis.     gabapentin 300 MG capsule  Commonly known as:  NEURONTIN  Take 300 mg by mouth daily.     ibuprofen 800 MG tablet  Commonly known as:  ADVIL,MOTRIN  Take 800 mg by mouth 2 (two) times daily.     lanthanum 1000 MG chewable tablet  Commonly known as:  FOSRENOL  Chew 1,000 mg by mouth 3 (three) times daily with meals.     metoprolol tartrate 25 MG tablet  Commonly known as:  LOPRESSOR  Take 1 tablet (25 mg total) by mouth 2 (two) times daily.     MULTI VITAMIN MENS PO  Take 1 tablet by mouth daily.     sorbitol 70 % solution  Take 15-60 mLs by mouth daily as needed (dialysis days).        Filed Vitals:   07/17/14 0900  BP: 113/79  Pulse: 116  Temp: 98.6 F (37 C)  Resp: 16    Skin clean, dry and intact without evidence of skin break down, no evidence of skin tears noted. IV catheter was not present, per patient he lost it yesterday.. Pt denies pain at this time. No complaints noted.  An After Visit Summary was printed and given to the patient. Patient escorted via [place], and D/C home via private auto.  [employer], RN-BC, [employer] [facility]  [address] Phone [phone]

## 2014-07-17 NOTE — Discharge Instructions (Signed)
Please follow-up your blood count at dialysis on Monday. We would continue to encourage that you come to the hospital for a blood transfusion as your blood levels are still dangerously low.    Bloody Stools Bloody stools means there is blood in your poop (stool). It is a sign that there is a problem somewhere in the digestive system. It is important for your doctor to find the cause of your bleeding, so the problem can be treated.  HOME CARE  Only take medicine as told by your doctor.  Eat foods with fiber (prunes, bran cereals).  Drink enough fluids to keep your pee (urine) clear or pale yellow.  Sit in warm water (sitz bath) for 10 to 15 minutes as told by your doctor.  Know how to take your medicines (enemas, suppositories) if advised by your doctor.  Watch for signs that you are getting better or getting worse. GET HELP RIGHT AWAY IF:   You are not getting better.  You start to get better but then get worse again.  You have new problems.  You have severe bleeding from the place where poop comes out (rectum) that does not stop.  You throw up (vomit) blood.  You feel weak or pass out (faint).  You have a fever. MAKE SURE YOU:   Understand these instructions.  Will watch your condition.  Will get help right away if you are not doing well or get worse. Document Released: 01/17/2009 Document Revised: 04/23/2011 Document Reviewed: 06/16/2010 [facility] Patient Information 2015 [place], [place]. This information is not intended to replace advice given to you by your health care provider. Make sure you discuss any questions you have with your health care provider.  Bloody Stools Bloody stools means there is blood in your poop (stool). It is a sign that there is a problem somewhere in the digestive system. It is important for your doctor to find the cause of your bleeding, so the problem can be treated.  HOME CARE  Only take medicine as told by your doctor.  Eat foods with  fiber (prunes, bran cereals).  Drink enough fluids to keep your pee (urine) clear or pale yellow.  Sit in warm water (sitz bath) for 10 to 15 minutes as told by your doctor.  Know how to take your medicines (enemas, suppositories) if advised by your doctor.  Watch for signs that you are getting better or getting worse. GET HELP RIGHT AWAY IF:   You are not getting better.  You start to get better but then get worse again.  You have new problems.  You have severe bleeding from the place where poop comes out (rectum) that does not stop.  You throw up (vomit) blood.  You feel weak or pass out (faint).  You have a fever. MAKE SURE YOU:   Understand these instructions.  Will watch your condition.  Will get help right away if you are not doing well or get worse. Document Released: 01/17/2009 Document Revised: 04/23/2011 Document Reviewed: 06/16/2010 [facility] Patient Information 2015 [place], [place]. This information is not intended to replace advice given to you by your health care provider. Make sure you discuss any questions you have with your health care provider.

## 2014-07-19 DIAGNOSIS — D631 Anemia in chronic kidney disease: Secondary | ICD-10-CM | POA: Diagnosis not present

## 2014-07-19 DIAGNOSIS — D509 Iron deficiency anemia, unspecified: Secondary | ICD-10-CM | POA: Diagnosis not present

## 2014-07-19 DIAGNOSIS — E875 Hyperkalemia: Secondary | ICD-10-CM | POA: Diagnosis not present

## 2014-07-19 DIAGNOSIS — N2581 Secondary hyperparathyroidism of renal origin: Secondary | ICD-10-CM | POA: Diagnosis not present

## 2014-07-19 DIAGNOSIS — N186 End stage renal disease: Secondary | ICD-10-CM | POA: Diagnosis not present

## 2014-07-19 DIAGNOSIS — E1129 Type 2 diabetes mellitus with other diabetic kidney complication: Secondary | ICD-10-CM | POA: Diagnosis not present

## 2014-07-20 DIAGNOSIS — E119 Type 2 diabetes mellitus without complications: Secondary | ICD-10-CM | POA: Diagnosis not present

## 2014-07-20 DIAGNOSIS — H10413 Chronic giant papillary conjunctivitis, bilateral: Secondary | ICD-10-CM | POA: Diagnosis not present

## 2014-07-20 DIAGNOSIS — H47323 Drusen of optic disc, bilateral: Secondary | ICD-10-CM | POA: Diagnosis not present

## 2014-07-20 DIAGNOSIS — H25813 Combined forms of age-related cataract, bilateral: Secondary | ICD-10-CM | POA: Diagnosis not present

## 2014-07-21 DIAGNOSIS — N186 End stage renal disease: Secondary | ICD-10-CM | POA: Diagnosis not present

## 2014-07-21 DIAGNOSIS — D509 Iron deficiency anemia, unspecified: Secondary | ICD-10-CM | POA: Diagnosis not present

## 2014-07-21 DIAGNOSIS — E875 Hyperkalemia: Secondary | ICD-10-CM | POA: Diagnosis not present

## 2014-07-21 DIAGNOSIS — N2581 Secondary hyperparathyroidism of renal origin: Secondary | ICD-10-CM | POA: Diagnosis not present

## 2014-07-21 DIAGNOSIS — D631 Anemia in chronic kidney disease: Secondary | ICD-10-CM | POA: Diagnosis not present

## 2014-07-21 DIAGNOSIS — E1129 Type 2 diabetes mellitus with other diabetic kidney complication: Secondary | ICD-10-CM | POA: Diagnosis not present

## 2014-07-23 DIAGNOSIS — D631 Anemia in chronic kidney disease: Secondary | ICD-10-CM | POA: Diagnosis not present

## 2014-07-23 DIAGNOSIS — D509 Iron deficiency anemia, unspecified: Secondary | ICD-10-CM | POA: Diagnosis not present

## 2014-07-23 DIAGNOSIS — N186 End stage renal disease: Secondary | ICD-10-CM | POA: Diagnosis not present

## 2014-07-23 DIAGNOSIS — E875 Hyperkalemia: Secondary | ICD-10-CM | POA: Diagnosis not present

## 2014-07-23 DIAGNOSIS — N2581 Secondary hyperparathyroidism of renal origin: Secondary | ICD-10-CM | POA: Diagnosis not present

## 2014-07-23 DIAGNOSIS — E1129 Type 2 diabetes mellitus with other diabetic kidney complication: Secondary | ICD-10-CM | POA: Diagnosis not present

## 2014-07-26 DIAGNOSIS — D509 Iron deficiency anemia, unspecified: Secondary | ICD-10-CM | POA: Diagnosis not present

## 2014-07-26 DIAGNOSIS — E1129 Type 2 diabetes mellitus with other diabetic kidney complication: Secondary | ICD-10-CM | POA: Diagnosis not present

## 2014-07-26 DIAGNOSIS — E875 Hyperkalemia: Secondary | ICD-10-CM | POA: Diagnosis not present

## 2014-07-26 DIAGNOSIS — N186 End stage renal disease: Secondary | ICD-10-CM | POA: Diagnosis not present

## 2014-07-26 DIAGNOSIS — D631 Anemia in chronic kidney disease: Secondary | ICD-10-CM | POA: Diagnosis not present

## 2014-07-26 DIAGNOSIS — N2581 Secondary hyperparathyroidism of renal origin: Secondary | ICD-10-CM | POA: Diagnosis not present

## 2014-07-28 DIAGNOSIS — E1129 Type 2 diabetes mellitus with other diabetic kidney complication: Secondary | ICD-10-CM | POA: Diagnosis not present

## 2014-07-28 DIAGNOSIS — E875 Hyperkalemia: Secondary | ICD-10-CM | POA: Diagnosis not present

## 2014-07-28 DIAGNOSIS — N186 End stage renal disease: Secondary | ICD-10-CM | POA: Diagnosis not present

## 2014-07-28 DIAGNOSIS — N2581 Secondary hyperparathyroidism of renal origin: Secondary | ICD-10-CM | POA: Diagnosis not present

## 2014-07-28 DIAGNOSIS — D631 Anemia in chronic kidney disease: Secondary | ICD-10-CM | POA: Diagnosis not present

## 2014-07-28 DIAGNOSIS — D509 Iron deficiency anemia, unspecified: Secondary | ICD-10-CM | POA: Diagnosis not present

## 2014-07-30 DIAGNOSIS — D631 Anemia in chronic kidney disease: Secondary | ICD-10-CM | POA: Diagnosis not present

## 2014-07-30 DIAGNOSIS — N186 End stage renal disease: Secondary | ICD-10-CM | POA: Diagnosis not present

## 2014-07-30 DIAGNOSIS — E875 Hyperkalemia: Secondary | ICD-10-CM | POA: Diagnosis not present

## 2014-07-30 DIAGNOSIS — D509 Iron deficiency anemia, unspecified: Secondary | ICD-10-CM | POA: Diagnosis not present

## 2014-07-30 DIAGNOSIS — E1129 Type 2 diabetes mellitus with other diabetic kidney complication: Secondary | ICD-10-CM | POA: Diagnosis not present

## 2014-07-30 DIAGNOSIS — N2581 Secondary hyperparathyroidism of renal origin: Secondary | ICD-10-CM | POA: Diagnosis not present

## 2014-08-02 DIAGNOSIS — D631 Anemia in chronic kidney disease: Secondary | ICD-10-CM | POA: Diagnosis not present

## 2014-08-02 DIAGNOSIS — E875 Hyperkalemia: Secondary | ICD-10-CM | POA: Diagnosis not present

## 2014-08-02 DIAGNOSIS — N2581 Secondary hyperparathyroidism of renal origin: Secondary | ICD-10-CM | POA: Diagnosis not present

## 2014-08-02 DIAGNOSIS — N186 End stage renal disease: Secondary | ICD-10-CM | POA: Diagnosis not present

## 2014-08-02 DIAGNOSIS — D509 Iron deficiency anemia, unspecified: Secondary | ICD-10-CM | POA: Diagnosis not present

## 2014-08-02 DIAGNOSIS — E1129 Type 2 diabetes mellitus with other diabetic kidney complication: Secondary | ICD-10-CM | POA: Diagnosis not present

## 2014-08-04 DIAGNOSIS — N186 End stage renal disease: Secondary | ICD-10-CM | POA: Diagnosis not present

## 2014-08-04 DIAGNOSIS — N2581 Secondary hyperparathyroidism of renal origin: Secondary | ICD-10-CM | POA: Diagnosis not present

## 2014-08-04 DIAGNOSIS — E875 Hyperkalemia: Secondary | ICD-10-CM | POA: Diagnosis not present

## 2014-08-04 DIAGNOSIS — D509 Iron deficiency anemia, unspecified: Secondary | ICD-10-CM | POA: Diagnosis not present

## 2014-08-04 DIAGNOSIS — E1129 Type 2 diabetes mellitus with other diabetic kidney complication: Secondary | ICD-10-CM | POA: Diagnosis not present

## 2014-08-04 DIAGNOSIS — D631 Anemia in chronic kidney disease: Secondary | ICD-10-CM | POA: Diagnosis not present

## 2014-08-06 DIAGNOSIS — N2581 Secondary hyperparathyroidism of renal origin: Secondary | ICD-10-CM | POA: Diagnosis not present

## 2014-08-06 DIAGNOSIS — D509 Iron deficiency anemia, unspecified: Secondary | ICD-10-CM | POA: Diagnosis not present

## 2014-08-06 DIAGNOSIS — N186 End stage renal disease: Secondary | ICD-10-CM | POA: Diagnosis not present

## 2014-08-06 DIAGNOSIS — E875 Hyperkalemia: Secondary | ICD-10-CM | POA: Diagnosis not present

## 2014-08-06 DIAGNOSIS — D631 Anemia in chronic kidney disease: Secondary | ICD-10-CM | POA: Diagnosis not present

## 2014-08-06 DIAGNOSIS — E1129 Type 2 diabetes mellitus with other diabetic kidney complication: Secondary | ICD-10-CM | POA: Diagnosis not present

## 2014-08-09 DIAGNOSIS — D631 Anemia in chronic kidney disease: Secondary | ICD-10-CM | POA: Diagnosis not present

## 2014-08-09 DIAGNOSIS — D509 Iron deficiency anemia, unspecified: Secondary | ICD-10-CM | POA: Diagnosis not present

## 2014-08-09 DIAGNOSIS — E875 Hyperkalemia: Secondary | ICD-10-CM | POA: Diagnosis not present

## 2014-08-09 DIAGNOSIS — N186 End stage renal disease: Secondary | ICD-10-CM | POA: Diagnosis not present

## 2014-08-09 DIAGNOSIS — N2581 Secondary hyperparathyroidism of renal origin: Secondary | ICD-10-CM | POA: Diagnosis not present

## 2014-08-09 DIAGNOSIS — E1129 Type 2 diabetes mellitus with other diabetic kidney complication: Secondary | ICD-10-CM | POA: Diagnosis not present

## 2014-08-11 DIAGNOSIS — N186 End stage renal disease: Secondary | ICD-10-CM | POA: Diagnosis not present

## 2014-08-11 DIAGNOSIS — E875 Hyperkalemia: Secondary | ICD-10-CM | POA: Diagnosis not present

## 2014-08-11 DIAGNOSIS — E1129 Type 2 diabetes mellitus with other diabetic kidney complication: Secondary | ICD-10-CM | POA: Diagnosis not present

## 2014-08-11 DIAGNOSIS — D631 Anemia in chronic kidney disease: Secondary | ICD-10-CM | POA: Diagnosis not present

## 2014-08-11 DIAGNOSIS — N2581 Secondary hyperparathyroidism of renal origin: Secondary | ICD-10-CM | POA: Diagnosis not present

## 2014-08-11 DIAGNOSIS — D509 Iron deficiency anemia, unspecified: Secondary | ICD-10-CM | POA: Diagnosis not present

## 2014-08-12 ENCOUNTER — Other Ambulatory Visit: Payer: Self-pay | Admitting: Internal Medicine

## 2014-08-12 DIAGNOSIS — Z992 Dependence on renal dialysis: Secondary | ICD-10-CM | POA: Diagnosis not present

## 2014-08-12 DIAGNOSIS — I12 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 stage renal disease: Secondary | ICD-10-CM | POA: Diagnosis not present

## 2014-08-12 DIAGNOSIS — N186 End stage renal disease: Secondary | ICD-10-CM | POA: Diagnosis not present

## 2014-08-13 DIAGNOSIS — E1129 Type 2 diabetes mellitus with other diabetic kidney complication: Secondary | ICD-10-CM | POA: Diagnosis not present

## 2014-08-13 DIAGNOSIS — N186 End stage renal disease: Secondary | ICD-10-CM | POA: Diagnosis not present

## 2014-08-13 DIAGNOSIS — E875 Hyperkalemia: Secondary | ICD-10-CM | POA: Diagnosis not present

## 2014-08-13 DIAGNOSIS — N2581 Secondary hyperparathyroidism of renal origin: Secondary | ICD-10-CM | POA: Diagnosis not present

## 2014-08-13 DIAGNOSIS — D631 Anemia in chronic kidney disease: Secondary | ICD-10-CM | POA: Diagnosis not present

## 2014-08-13 DIAGNOSIS — D509 Iron deficiency anemia, unspecified: Secondary | ICD-10-CM | POA: Diagnosis not present

## 2014-08-16 DIAGNOSIS — E875 Hyperkalemia: Secondary | ICD-10-CM | POA: Diagnosis not present

## 2014-08-16 DIAGNOSIS — N186 End stage renal disease: Secondary | ICD-10-CM | POA: Diagnosis not present

## 2014-08-16 DIAGNOSIS — N2581 Secondary hyperparathyroidism of renal origin: Secondary | ICD-10-CM | POA: Diagnosis not present

## 2014-08-16 DIAGNOSIS — D509 Iron deficiency anemia, unspecified: Secondary | ICD-10-CM | POA: Diagnosis not present

## 2014-08-16 DIAGNOSIS — D631 Anemia in chronic kidney disease: Secondary | ICD-10-CM | POA: Diagnosis not present

## 2014-08-16 DIAGNOSIS — E1129 Type 2 diabetes mellitus with other diabetic kidney complication: Secondary | ICD-10-CM | POA: Diagnosis not present

## 2014-08-18 DIAGNOSIS — N186 End stage renal disease: Secondary | ICD-10-CM | POA: Diagnosis not present

## 2014-08-18 DIAGNOSIS — D631 Anemia in chronic kidney disease: Secondary | ICD-10-CM | POA: Diagnosis not present

## 2014-08-18 DIAGNOSIS — E1129 Type 2 diabetes mellitus with other diabetic kidney complication: Secondary | ICD-10-CM | POA: Diagnosis not present

## 2014-08-18 DIAGNOSIS — N2581 Secondary hyperparathyroidism of renal origin: Secondary | ICD-10-CM | POA: Diagnosis not present

## 2014-08-18 DIAGNOSIS — E875 Hyperkalemia: Secondary | ICD-10-CM | POA: Diagnosis not present

## 2014-08-18 DIAGNOSIS — D509 Iron deficiency anemia, unspecified: Secondary | ICD-10-CM | POA: Diagnosis not present

## 2014-08-20 DIAGNOSIS — E875 Hyperkalemia: Secondary | ICD-10-CM | POA: Diagnosis not present

## 2014-08-20 DIAGNOSIS — N2581 Secondary hyperparathyroidism of renal origin: Secondary | ICD-10-CM | POA: Diagnosis not present

## 2014-08-20 DIAGNOSIS — N186 End stage renal disease: Secondary | ICD-10-CM | POA: Diagnosis not present

## 2014-08-20 DIAGNOSIS — D509 Iron deficiency anemia, unspecified: Secondary | ICD-10-CM | POA: Diagnosis not present

## 2014-08-20 DIAGNOSIS — E1129 Type 2 diabetes mellitus with other diabetic kidney complication: Secondary | ICD-10-CM | POA: Diagnosis not present

## 2014-08-20 DIAGNOSIS — D631 Anemia in chronic kidney disease: Secondary | ICD-10-CM | POA: Diagnosis not present

## 2014-08-23 DIAGNOSIS — E1129 Type 2 diabetes mellitus with other diabetic kidney complication: Secondary | ICD-10-CM | POA: Diagnosis not present

## 2014-08-23 DIAGNOSIS — E875 Hyperkalemia: Secondary | ICD-10-CM | POA: Diagnosis not present

## 2014-08-23 DIAGNOSIS — N2581 Secondary hyperparathyroidism of renal origin: Secondary | ICD-10-CM | POA: Diagnosis not present

## 2014-08-23 DIAGNOSIS — N186 End stage renal disease: Secondary | ICD-10-CM | POA: Diagnosis not present

## 2014-08-23 DIAGNOSIS — D631 Anemia in chronic kidney disease: Secondary | ICD-10-CM | POA: Diagnosis not present

## 2014-08-23 DIAGNOSIS — D509 Iron deficiency anemia, unspecified: Secondary | ICD-10-CM | POA: Diagnosis not present

## 2014-08-25 DIAGNOSIS — E1129 Type 2 diabetes mellitus with other diabetic kidney complication: Secondary | ICD-10-CM | POA: Diagnosis not present

## 2014-08-25 DIAGNOSIS — N186 End stage renal disease: Secondary | ICD-10-CM | POA: Diagnosis not present

## 2014-08-25 DIAGNOSIS — D631 Anemia in chronic kidney disease: Secondary | ICD-10-CM | POA: Diagnosis not present

## 2014-08-25 DIAGNOSIS — D509 Iron deficiency anemia, unspecified: Secondary | ICD-10-CM | POA: Diagnosis not present

## 2014-08-25 DIAGNOSIS — E875 Hyperkalemia: Secondary | ICD-10-CM | POA: Diagnosis not present

## 2014-08-25 DIAGNOSIS — N2581 Secondary hyperparathyroidism of renal origin: Secondary | ICD-10-CM | POA: Diagnosis not present

## 2014-08-26 ENCOUNTER — Encounter: Payer: Self-pay | Admitting: Neurology

## 2014-08-26 ENCOUNTER — Ambulatory Visit (INDEPENDENT_AMBULATORY_CARE_PROVIDER_SITE_OTHER): Payer: Medicare Other | Admitting: Neurology

## 2014-08-26 VITALS — BP 130/86 | HR 77 | Ht 73.0 in | Wt 241.6 lb

## 2014-08-26 DIAGNOSIS — Z992 Dependence on renal dialysis: Secondary | ICD-10-CM

## 2014-08-26 DIAGNOSIS — I151 Hypertension secondary to other renal disorders: Secondary | ICD-10-CM | POA: Diagnosis not present

## 2014-08-26 DIAGNOSIS — I639 Cerebral infarction, unspecified: Secondary | ICD-10-CM

## 2014-08-26 DIAGNOSIS — N2889 Other specified disorders of kidney and ureter: Secondary | ICD-10-CM | POA: Diagnosis not present

## 2014-08-26 DIAGNOSIS — N186 End stage renal disease: Secondary | ICD-10-CM | POA: Diagnosis not present

## 2014-08-26 DIAGNOSIS — I6783 Posterior reversible encephalopathy syndrome: Secondary | ICD-10-CM

## 2014-08-26 MED ORDER — ASPIRIN EC 81 MG PO TBEC: 81.0000 mg | DELAYED_RELEASE_TABLET | Freq: Every day | ORAL | Status: DC

## 2014-08-26 NOTE — Progress Notes (Signed)
STROKE NEUROLOGY FOLLOW UP NOTE  NAME: Carl Itkin Sr. DOB: 1960/08/28  REASON FOR VISIT: stroke follow up HISTORY FROM: pt and chart  Today we had the pleasure of seeing Carl Casten Sr. in follow-up at our Neurology Clinic. Pt was accompanied by no one.   History Summary [name] is a 54 y.o. male with history of HTN, ESRD on HD was admitted for acute onset of dizziness on 05/22/14. He was helping his brother for car repair and struck his head at the top to the hood. A couple of hours later, he felt very dizzy and not right. 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 Also, right occipital pole has tiny DWI changes which may related to PRES. He was admitted for further evaluation. Stroke work up including MRA, TTE and CUS unremarkable. LDL 91. During hospitalization, his dizziness resolved and he was discharged with ASA 325 and lipitor.   Interval History During the interval time, the patient has been doing well. No recurrent stroke symptoms or seizure. However, he had LGIB in 06/2014 and was admitted. Hb as low as to 5.4. He refused blood transfusion. His ASA was d/c'ed. LGIB gradually stopped and last check Hb was 10.4. Currently, he feels good without any abdominal pain and no bleeding. He is in the waiting list for kidney transplantation. BP today 130/86.   REVIEW OF SYSTEMS: Full 14 system review of systems performed and notable only for those listed below and in HPI above, all others are negative:  Constitutional:   Cardiovascular:  Ear/Nose/Throat:   Skin:  Eyes:  Blurry vision Respiratory:   Gastroitestinal:   Genitourinary:  Hematology/Lymphatic:   Endocrine:  Musculoskeletal:   Allergy/Immunology:   Neurological:  HA Psychiatric:  Sleep:   The following represents the patient's updated allergies and side effects list: Allergies  Allergen Reactions    . Infed [Iron Dextran] Other (See Comments)    Decreased BP   . Oxycodone Nausea Only  . Vicodin [Hydrocodone-Acetaminophen] Other (See Comments)    Decreased BP  . Diclofenac Other (See Comments)    Unknown  . Whole Blood Other (See Comments)    Unk    The neurologically relevant items on the patient's problem list were reviewed on today's visit.  Neurologic Examination  A problem focused neurological exam (12 or more points of the single system neurologic examination, vital signs counts as 1 point, cranial nerves count for 8 points) was performed.  Blood pressure 130/86, pulse 77, height 6\' 1"  (1.854 m), weight 241 lb 9.6 oz (109.589 kg).  General - Well nourished, well developed, in no apparent distress.  Ophthalmologic - Sharp disc margins OU.   Cardiovascular - Regular rate and rhythm with no murmur.  Mental Status -  Level of arousal and orientation to time, place, and person were intact. Language including expression, naming, repetition, comprehension was assessed and found intact. Attention span and concentration were normal. Fund of Knowledge was assessed and was intact.  Cranial Nerves II - XII - II - Visual field intact OU. III, IV, VI - Extraocular movements intact. V - Facial sensation intact bilaterally. VII - Facial movement intact bilaterally. VIII - Hearing & vestibular intact bilaterally. X - Palate elevates symmetrically. XI - Chin turning & shoulder shrug intact bilaterally. XII - Tongue protrusion intact.  Motor Strength - The patient's strength was normal in all extremities and pronator drift  was absent.  Bulk was normal and fasciculations were absent.   Motor Tone - Muscle tone was assessed at the neck and appendages and was normal.  Reflexes - The patient's reflexes were 1+ in all extremities and he had no pathological reflexes.  Sensory - Light touch, temperature/pinprick were assessed and were normal.    Coordination - The patient had normal  movements in the hands and feet with no ataxia or dysmetria.  Tremor was absent.  Gait and Station - The patient's transfers, posture, gait, station, and turns were observed as normal.  Data reviewed: I personally reviewed the images and agree with the radiology interpretations.  Ct Head (brain) Wo Contrast 05/22/2014 Prominent dural calcification along the falx and tentorium again demonstrated. 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 No acute intracranial abnormalities. No significant change since previous study.   MRI HEAD  05/23/2014 Diffuse dural calcifications/dural thickening. Parietal-occipital FLAIR abnormality greater on the right suspicious for posterior reversible encephalopathy syndrome. Other potential causes for this appearance include that secondary to; artifact, meningitis, subarachnoid hemorrhage or result of external supplemental oxygen. Adequate flow within the superior sagittal sinus cannot be confirmed. This appearance may be chronic and related to the adjacent dural thickening however, sagittal sinus thrombosis could contribute to the parietal occipital FLAIR abnormality. Suggestion of tiny acute posterior left corona radiata infarct. Subtle restricted motion medial aspect of the right parietal -occipital lobe may represent result of posterior reversible encephalopathy syndrome or small infarct. Abnormal vessel extends from the left internal cerebral vein level along the left ambient cistern to the superior margin of the left internal auditory canal. Question small dural fistula. Opacification left petrous apex. Abnormal bone marrow may represent anemia of renal disease. 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 Left parotid 1.4 x 1 centimeter nonspecific lesion incompletely assessed on present exam.   MRA HEAD  05/23/2014 Exam is motion degraded. Mild narrowing and irregularity right internal carotid artery cavernous segment without high-grade stenosis. Prominent left  posterior communicating artery origin infundibulum without discrete aneurysm. Hypoplastic A1 segment right anterior cerebral artery. Azygous configuration A2 segment anterior cerebral artery. Middle cerebral artery mild to moderate branch vessel narrowing and irregularity. Non visualized left posterior inferior cerebellar artery. Mild narrowing distal right vertebral artery. Nonvisualized right anterior inferior cerebellar artery. Posterior cerebral artery branch vessel irregularity. Irregularity and narrowing superior cerebellar artery more notable on the right.   2D echo - - Left ventricle: The cavity size was normal. Systolic function was normal. The estimated ejection fraction was in the range of 55% to 60%. Wall motion was normal; there were no regional wall motion abnormalities. Doppler parameters are consistent with abnormal left ventricular relaxation (grade 1 diastolic dysfunction). - Pericardium, extracardiac: A trivial pericardial effusion was identified.  CUS - Bilateral: 1-39% ICA stenosis. Vertebral artery flow is antegrade.  Component     Latest Ref Rng 05/23/2014 07/14/2014  Cholesterol     0 - 200 mg/dL 154   Triglycerides     <150 mg/dL 50   HDL Cholesterol     >39 mg/dL 53   Total CHOL/HDL Ratio      2.9   VLDL     0 - 40 mg/dL 10   LDL (calc)     0 - 99 mg/dL 91   Hemoglobin A1C     4.8 - 5.6 % 5.4   Mean Plasma Glucose      108   HIV Screen 4th Generation wRfx     Non Reactive  Non Reactive  Vitamin B-12     180 - 914 pg/mL  1227 (H)    Assessment: As you may recall, he is a 54 y.o. African American male with PMH of HTN, ESRD on HD was admitted for acute onset of dizziness on 05/22/14. 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 Also, right occipital pole has tiny DWI changes which may related to PRES. Stroke work up including TTE and CUS unremarkable. LDL  91. His dizziness resolved and he was discharged with ASA 325 and lipitor. During the interval time, he developed LGIB, ASA was d/c'ed. His Hb now back to 10.4 and he feels good. Will need to resume baby ASA for stroke prevention, but also need to repeat MRI, MRA and MRV to evaluation the resolution of PRES and rule out CVT as there is some concern about cerebral veins.   Plan:  - continue lipitor low dose for stroke prevention - will start ASA 81mg  for stroke prevention  - bleeding precautions  - will repeat MRI MRA and MRV  - Follow up with your primary care physician for stroke risk factor modification. Recommend maintain blood pressure goal <130/80, diabetes with hemoglobin A1c goal below 6.5% and lipids with LDL cholesterol goal below 70 mg/dL.  - follow up with nephrology and cardiology regularly - check BP at home - RTC in 2 months.  Orders Placed This Encounter  Procedures  . MR Brain Wo Contrast    Standing Status: Future     Number of Occurrences:      Standing Expiration Date: 10/28/2015    Order Specific Question:  Reason for Exam (SYMPTOM  OR DIAGNOSIS REQUIRED)    Answer:  rule out venous thrombosis    Order Specific Question:  Preferred imaging location?    Answer:  Internal    Order Specific Question:  Does the patient have a pacemaker or implanted devices?    Answer:  No    Order Specific Question:  What is the patient's sedation requirement?    Answer:  No Sedation  . MR MRA HEAD WO CONTRAST    Standing Status: Future     Number of Occurrences:      Standing Expiration Date: 10/28/2015    Order Specific Question:  Reason for Exam (SYMPTOM  OR DIAGNOSIS REQUIRED)    Answer:  rule out venous thrombosis    Order Specific Question:  Preferred imaging location?    Answer:  Internal    Order Specific Question:  Does the patient have a pacemaker or implanted devices?    Answer:  No    Order Specific Question:  What is the patient's sedation requirement?    Answer:  No  Sedation  . MR MRV HEAD WO CM    Standing Status: Future     Number of Occurrences:      Standing Expiration Date: 10/27/2015    Order Specific Question:  Reason for Exam (SYMPTOM  OR DIAGNOSIS REQUIRED)    Answer:  rule out venous thrombosis    Order Specific Question:  Preferred imaging location?    Answer:  Internal    Order Specific Question:  Does the patient have a pacemaker or implanted devices?    Answer:  No    Order Specific Question:  What is the patient's sedation requirement?    Answer:  No Sedation    Meds ordered this encounter  Medications  . cromolyn (OPTICROM) 4 % ophthalmic  solution    Sig:   . aspirin EC 81 MG tablet    Sig: Take 1 tablet (81 mg total) by mouth daily.    Dispense:  90 tablet    Refill:  3    Patient Instructions  - continue lipitor low dose for stroke prevention - will start baby ASA for stroke prevention since your Hb is up to 10.4 and no more GIB - observe closely for bleeding.  - will repeat MRI brain to evaluate previous PRES - Follow up with your primary care physician for stroke risk factor modification. Recommend maintain blood pressure goal <130/80, diabetes with hemoglobin A1c goal below 6.5% and lipids with LDL cholesterol goal below 70 mg/dL.  - follow up with nephrology for ESRD on HD - follow up with cardiology regularly - check BP at home - follow up in 2 months.    Rosalin Hawking, MD PhD [facility] [address] [place],  [address] [phone]

## 2014-08-26 NOTE — Patient Instructions (Addendum)
-   continue lipitor low dose for stroke prevention - will start baby ASA for stroke prevention since your Hb is up to 10.4 and no more GIB - observe closely for bleeding.  - will repeat MRI brain to evaluate previous PRES - Follow up with your primary care physician for stroke risk factor modification. Recommend maintain blood pressure goal <130/80, diabetes with hemoglobin A1c goal below 6.5% and lipids with LDL cholesterol goal below 70 mg/dL.  - follow up with nephrology for ESRD on HD - follow up with cardiology regularly - check BP at home - follow up in 2 months.

## 2014-08-27 DIAGNOSIS — D631 Anemia in chronic kidney disease: Secondary | ICD-10-CM | POA: Diagnosis not present

## 2014-08-27 DIAGNOSIS — N2581 Secondary hyperparathyroidism of renal origin: Secondary | ICD-10-CM | POA: Diagnosis not present

## 2014-08-27 DIAGNOSIS — D509 Iron deficiency anemia, unspecified: Secondary | ICD-10-CM | POA: Diagnosis not present

## 2014-08-27 DIAGNOSIS — E1129 Type 2 diabetes mellitus with other diabetic kidney complication: Secondary | ICD-10-CM | POA: Diagnosis not present

## 2014-08-27 DIAGNOSIS — E875 Hyperkalemia: Secondary | ICD-10-CM | POA: Diagnosis not present

## 2014-08-27 DIAGNOSIS — N186 End stage renal disease: Secondary | ICD-10-CM | POA: Diagnosis not present

## 2014-08-30 DIAGNOSIS — E1129 Type 2 diabetes mellitus with other diabetic kidney complication: Secondary | ICD-10-CM | POA: Diagnosis not present

## 2014-08-30 DIAGNOSIS — N2581 Secondary hyperparathyroidism of renal origin: Secondary | ICD-10-CM | POA: Diagnosis not present

## 2014-08-30 DIAGNOSIS — E875 Hyperkalemia: Secondary | ICD-10-CM | POA: Diagnosis not present

## 2014-08-30 DIAGNOSIS — D509 Iron deficiency anemia, unspecified: Secondary | ICD-10-CM | POA: Diagnosis not present

## 2014-08-30 DIAGNOSIS — N186 End stage renal disease: Secondary | ICD-10-CM | POA: Diagnosis not present

## 2014-08-30 DIAGNOSIS — D631 Anemia in chronic kidney disease: Secondary | ICD-10-CM | POA: Diagnosis not present

## 2014-09-01 DIAGNOSIS — N2581 Secondary hyperparathyroidism of renal origin: Secondary | ICD-10-CM | POA: Diagnosis not present

## 2014-09-01 DIAGNOSIS — E1129 Type 2 diabetes mellitus with other diabetic kidney complication: Secondary | ICD-10-CM | POA: Diagnosis not present

## 2014-09-01 DIAGNOSIS — D509 Iron deficiency anemia, unspecified: Secondary | ICD-10-CM | POA: Diagnosis not present

## 2014-09-01 DIAGNOSIS — E875 Hyperkalemia: Secondary | ICD-10-CM | POA: Diagnosis not present

## 2014-09-01 DIAGNOSIS — N186 End stage renal disease: Secondary | ICD-10-CM | POA: Diagnosis not present

## 2014-09-01 DIAGNOSIS — D631 Anemia in chronic kidney disease: Secondary | ICD-10-CM | POA: Diagnosis not present

## 2014-09-03 DIAGNOSIS — E1129 Type 2 diabetes mellitus with other diabetic kidney complication: Secondary | ICD-10-CM | POA: Diagnosis not present

## 2014-09-03 DIAGNOSIS — N186 End stage renal disease: Secondary | ICD-10-CM | POA: Diagnosis not present

## 2014-09-03 DIAGNOSIS — N2581 Secondary hyperparathyroidism of renal origin: Secondary | ICD-10-CM | POA: Diagnosis not present

## 2014-09-03 DIAGNOSIS — E875 Hyperkalemia: Secondary | ICD-10-CM | POA: Diagnosis not present

## 2014-09-03 DIAGNOSIS — D631 Anemia in chronic kidney disease: Secondary | ICD-10-CM | POA: Diagnosis not present

## 2014-09-03 DIAGNOSIS — D509 Iron deficiency anemia, unspecified: Secondary | ICD-10-CM | POA: Diagnosis not present

## 2014-09-06 ENCOUNTER — Ambulatory Visit: Payer: Medicare Other | Admitting: Gastroenterology

## 2014-09-06 DIAGNOSIS — D631 Anemia in chronic kidney disease: Secondary | ICD-10-CM | POA: Diagnosis not present

## 2014-09-06 DIAGNOSIS — E1129 Type 2 diabetes mellitus with other diabetic kidney complication: Secondary | ICD-10-CM | POA: Diagnosis not present

## 2014-09-06 DIAGNOSIS — E875 Hyperkalemia: Secondary | ICD-10-CM | POA: Diagnosis not present

## 2014-09-06 DIAGNOSIS — N186 End stage renal disease: Secondary | ICD-10-CM | POA: Diagnosis not present

## 2014-09-06 DIAGNOSIS — N2581 Secondary hyperparathyroidism of renal origin: Secondary | ICD-10-CM | POA: Diagnosis not present

## 2014-09-06 DIAGNOSIS — D509 Iron deficiency anemia, unspecified: Secondary | ICD-10-CM | POA: Diagnosis not present

## 2014-09-08 DIAGNOSIS — E875 Hyperkalemia: Secondary | ICD-10-CM | POA: Diagnosis not present

## 2014-09-08 DIAGNOSIS — N2581 Secondary hyperparathyroidism of renal origin: Secondary | ICD-10-CM | POA: Diagnosis not present

## 2014-09-08 DIAGNOSIS — E1129 Type 2 diabetes mellitus with other diabetic kidney complication: Secondary | ICD-10-CM | POA: Diagnosis not present

## 2014-09-08 DIAGNOSIS — N186 End stage renal disease: Secondary | ICD-10-CM | POA: Diagnosis not present

## 2014-09-08 DIAGNOSIS — D509 Iron deficiency anemia, unspecified: Secondary | ICD-10-CM | POA: Diagnosis not present

## 2014-09-08 DIAGNOSIS — D631 Anemia in chronic kidney disease: Secondary | ICD-10-CM | POA: Diagnosis not present

## 2014-09-09 ENCOUNTER — Other Ambulatory Visit: Payer: Medicare Other

## 2014-09-09 ENCOUNTER — Inpatient Hospital Stay: Admission: RE | Admit: 2014-09-09 | Payer: Medicare Other | Source: Ambulatory Visit

## 2014-09-10 DIAGNOSIS — E875 Hyperkalemia: Secondary | ICD-10-CM | POA: Diagnosis not present

## 2014-09-10 DIAGNOSIS — N186 End stage renal disease: Secondary | ICD-10-CM | POA: Diagnosis not present

## 2014-09-10 DIAGNOSIS — E1129 Type 2 diabetes mellitus with other diabetic kidney complication: Secondary | ICD-10-CM | POA: Diagnosis not present

## 2014-09-10 DIAGNOSIS — D631 Anemia in chronic kidney disease: Secondary | ICD-10-CM | POA: Diagnosis not present

## 2014-09-10 DIAGNOSIS — N2581 Secondary hyperparathyroidism of renal origin: Secondary | ICD-10-CM | POA: Diagnosis not present

## 2014-09-10 DIAGNOSIS — D509 Iron deficiency anemia, unspecified: Secondary | ICD-10-CM | POA: Diagnosis not present

## 2014-09-12 DIAGNOSIS — Z992 Dependence on renal dialysis: Secondary | ICD-10-CM | POA: Diagnosis not present

## 2014-09-12 DIAGNOSIS — N186 End stage renal disease: Secondary | ICD-10-CM | POA: Diagnosis not present

## 2014-09-12 DIAGNOSIS — I12 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 stage renal disease: Secondary | ICD-10-CM | POA: Diagnosis not present

## 2014-09-13 DIAGNOSIS — D509 Iron deficiency anemia, unspecified: Secondary | ICD-10-CM | POA: Diagnosis not present

## 2014-09-13 DIAGNOSIS — D631 Anemia in chronic kidney disease: Secondary | ICD-10-CM | POA: Diagnosis not present

## 2014-09-13 DIAGNOSIS — N186 End stage renal disease: Secondary | ICD-10-CM | POA: Diagnosis not present

## 2014-09-13 DIAGNOSIS — N2581 Secondary hyperparathyroidism of renal origin: Secondary | ICD-10-CM | POA: Diagnosis not present

## 2014-09-13 DIAGNOSIS — E875 Hyperkalemia: Secondary | ICD-10-CM | POA: Diagnosis not present

## 2014-09-15 DIAGNOSIS — N2581 Secondary hyperparathyroidism of renal origin: Secondary | ICD-10-CM | POA: Diagnosis not present

## 2014-09-15 DIAGNOSIS — N186 End stage renal disease: Secondary | ICD-10-CM | POA: Diagnosis not present

## 2014-09-15 DIAGNOSIS — E875 Hyperkalemia: Secondary | ICD-10-CM | POA: Diagnosis not present

## 2014-09-15 DIAGNOSIS — D509 Iron deficiency anemia, unspecified: Secondary | ICD-10-CM | POA: Diagnosis not present

## 2014-09-15 DIAGNOSIS — D631 Anemia in chronic kidney disease: Secondary | ICD-10-CM | POA: Diagnosis not present

## 2014-09-16 ENCOUNTER — Ambulatory Visit
Admission: RE | Admit: 2014-09-16 | Discharge: 2014-09-16 | Disposition: A | Discharge status: Home / Self Care | Payer: Medicare Other | Source: Ambulatory Visit | Attending: Neurology | Admitting: Neurology

## 2014-09-16 DIAGNOSIS — I639 Cerebral infarction, unspecified: Secondary | ICD-10-CM

## 2014-09-16 DIAGNOSIS — I6783 Posterior reversible encephalopathy syndrome: Secondary | ICD-10-CM

## 2014-09-17 DIAGNOSIS — E875 Hyperkalemia: Secondary | ICD-10-CM | POA: Diagnosis not present

## 2014-09-17 DIAGNOSIS — N2581 Secondary hyperparathyroidism of renal origin: Secondary | ICD-10-CM | POA: Diagnosis not present

## 2014-09-17 DIAGNOSIS — D509 Iron deficiency anemia, unspecified: Secondary | ICD-10-CM | POA: Diagnosis not present

## 2014-09-17 DIAGNOSIS — N186 End stage renal disease: Secondary | ICD-10-CM | POA: Diagnosis not present

## 2014-09-17 DIAGNOSIS — D631 Anemia in chronic kidney disease: Secondary | ICD-10-CM | POA: Diagnosis not present

## 2014-09-20 DIAGNOSIS — D509 Iron deficiency anemia, unspecified: Secondary | ICD-10-CM | POA: Diagnosis not present

## 2014-09-20 DIAGNOSIS — N186 End stage renal disease: Secondary | ICD-10-CM | POA: Diagnosis not present

## 2014-09-20 DIAGNOSIS — N2581 Secondary hyperparathyroidism of renal origin: Secondary | ICD-10-CM | POA: Diagnosis not present

## 2014-09-20 DIAGNOSIS — D631 Anemia in chronic kidney disease: Secondary | ICD-10-CM | POA: Diagnosis not present

## 2014-09-20 DIAGNOSIS — E875 Hyperkalemia: Secondary | ICD-10-CM | POA: Diagnosis not present

## 2014-09-22 DIAGNOSIS — N2581 Secondary hyperparathyroidism of renal origin: Secondary | ICD-10-CM | POA: Diagnosis not present

## 2014-09-22 DIAGNOSIS — D509 Iron deficiency anemia, unspecified: Secondary | ICD-10-CM | POA: Diagnosis not present

## 2014-09-22 DIAGNOSIS — D631 Anemia in chronic kidney disease: Secondary | ICD-10-CM | POA: Diagnosis not present

## 2014-09-22 DIAGNOSIS — E875 Hyperkalemia: Secondary | ICD-10-CM | POA: Diagnosis not present

## 2014-09-22 DIAGNOSIS — N186 End stage renal disease: Secondary | ICD-10-CM | POA: Diagnosis not present

## 2014-09-23 ENCOUNTER — Ambulatory Visit (INDEPENDENT_AMBULATORY_CARE_PROVIDER_SITE_OTHER): Payer: Medicare Other | Admitting: Physician Assistant

## 2014-09-23 ENCOUNTER — Encounter: Payer: Self-pay | Admitting: Physician Assistant

## 2014-09-23 ENCOUNTER — Ambulatory Visit
Admission: RE | Admit: 2014-09-23 | Discharge: 2014-09-23 | Disposition: A | Discharge status: Home / Self Care | Payer: Medicare Other | Source: Ambulatory Visit | Attending: Neurology | Admitting: Neurology

## 2014-09-23 VITALS — BP 132/74 | HR 84 | Ht 74.0 in | Wt 238.0 lb

## 2014-09-23 DIAGNOSIS — Z8719 Personal history of other diseases of the digestive system: Secondary | ICD-10-CM | POA: Diagnosis not present

## 2014-09-23 DIAGNOSIS — I6783 Posterior reversible encephalopathy syndrome: Secondary | ICD-10-CM | POA: Diagnosis not present

## 2014-09-23 DIAGNOSIS — I639 Cerebral infarction, unspecified: Secondary | ICD-10-CM

## 2014-09-23 DIAGNOSIS — D649 Anemia, unspecified: Secondary | ICD-10-CM | POA: Diagnosis not present

## 2014-09-23 NOTE — Patient Instructions (Signed)
You have been given Hemoccult cards to check for blood in your stools. Please mail these back in as soon as possible. We will contact you with the results.   Follow-up as needed.

## 2014-09-23 NOTE — Progress Notes (Signed)
Patient ID: Carl Hughbanks Sr., male   DOB: November 29, 1960, 54 y.o.   MRN: [id number]     History of Present Illness: Carl Carrazco Sr. is a pleasant 54 year old male with end-stage renal disease on hemodialysis, who was admitted to the hospital in May 2016 with rectal bleeding. The patient states he had gone fishing in [place],  [place] with his buddies, and for several nights had a small amount of bright red blood per rectum not associated with abdominal or rectal pain. His bleeding then stopped for 2 days, and when he went to dialysis he felt like he had to have diarrhea and states he filled the toilet with bright red blood per rectum. His hemoglobin had gone from 10.5 on May 19 to 6.1 on May 30. Patient had no chest pain, weakness, dizziness, etc. It was suspected that he had a diverticular hemorrhage. The patient would not take blood secondary to religious beliefs. He did except IV iron infusions. He was evaluated by Dr. Olevia Perches who felt that if he stopped bleeding on his own colonoscopy could be avoided, but if he continued to bleed he would need to have a hemoglobin of at least 8.5 before he could have a colonoscopy. His last colonoscopy was on 07/18/2010 at which time he was noted to have moderate diverticulosis sigmoid to ascending colon. He was given iron in the hospital and discharged home. He goes to dialysis on Monday Wednesdays and Fridays and has been getting E Pridgen and iron. His most recent hemoglobin on 09/15/2014 was 10.7. He states he is here today because he was advised to come see if he needs another colonoscopy. He has been feeling fine. He denies bright red blood per rectum or melena. He denies abdominal pain, nausea, vomiting, dysphagia, odynophagia, or early satiety. He is having a bowel movement on a daily basis. Patient has not had any heparin since he had his admission for GI bleed. He states that dialysis has been hesitant to give him more heparin and that he has been clotting a lot  at dialysis. Patient states he was advised to come for evaluation to determine whether or not he needs a repeat colonoscopy. He says he is on a list for transplant.   Past Medical History  Diagnosis Date  . Hypertension   . Anemia   . Chronic headaches   . Sleep apnea   . Chronic kidney disease     Chronic Renal Failure; On Renal Transplant List  . Presence of arteriovenous fistula for hemodialysis, primary     RUE  . Hemodialysis patient     M-W-F    Past Surgical History  Procedure Laterality Date  . Av fistula placement Right     right arm  . Graft left arm Left     for dialysis x 2  . Insertion of dialysis catheter      Rt chest   Family History  Problem Relation Age of Onset  . Diabetes Father   . Stroke Father   . Hypertension Father   . Uterine cancer Mother   . Lupus Sister   . Stroke Sister   . Anuerysm Brother     brain  . Hypertension Sister    Social History  Substance Use Topics  . Smoking status: Former Smoker    Types: Cigars    Quit date: 06/19/1990  . Smokeless tobacco: None  . Alcohol Use: 0.0 oz/week     Comment: occasional   Current Outpatient Prescriptions  Medication Sig  Dispense Refill  . aspirin EC 81 MG tablet Take 1 tablet (81 mg total) by mouth daily. 90 tablet 3  . atorvastatin (LIPITOR) 10 MG tablet Take 1 tablet (10 mg total) by mouth daily at 6 PM. 30 tablet 0  . calcium acetate (PHOSLO) 667 MG capsule Take 3,335 mg by mouth 3 (three) times daily with meals.     . cinacalcet (SENSIPAR) 60 MG tablet Take 120 mg by mouth daily.     . cromolyn (OPTICROM) 4 % ophthalmic solution     . Darbepoetin Alfa (ARANESP) 200 MCG/0.4ML SOSY injection Inject 0.4 mLs (200 mcg total) into the vein every Friday with hemodialysis. 1.68 mL   . gabapentin (NEURONTIN) 300 MG capsule Take 300 mg by mouth daily.     Marland Kitchen ibuprofen (ADVIL,MOTRIN) 800 MG tablet Take 800 mg by mouth 2 (two) times daily.    Marland Kitchen lanthanum (FOSRENOL) 1000 MG chewable tablet Chew  1,000 mg by mouth 3 (three) times daily with meals.    . Multiple Vitamin (MULTI VITAMIN MENS PO) Take 1 tablet by mouth daily.      . sorbitol 70 % solution Take 15-60 mLs by mouth daily as needed (dialysis days).      No current facility-administered medications for this visit.   Allergies  Allergen Reactions  . Infed [Iron Dextran] Other (See Comments)    Decreased BP   . Oxycodone Nausea Only  . Vicodin [Hydrocodone-Acetaminophen] Other (See Comments)    Decreased BP  . Diclofenac Other (See Comments)    Unknown  . Whole Blood Other (See Comments)    Unk      Review of Systems: Per history of present illness otherwise negative  LAB RESULTS: Blood work from [facility] Center shows 09/01/2014 ferritin 1127 09/01/2014 hematocrit 35.1 Hemoglobin 09/15/2014 10.7     Physical Exam: General: Pleasant, well developed , African-American male in no acute distress Head: Normocephalic and atraumatic Eyes:  sclerae anicteric, conjunctiva pink  Ears: Normal auditory acuity Lungs: Clear throughout to auscultation Heart: Regular rate and rhythm Abdomen: Soft, non distended, non-tender. No masses, no hepatomegaly. Normal bowel sounds Rectal: Light brown stool, heme negative Musculoskeletal: Symmetrical with no gross deformities  Extremities: No edema . Dialysis fistula right arm Neurological: Alert oriented x 4, grossly nonfocal Psychological:  Alert and cooperative. Normal mood and affect  Assessment and Recommendations: 54 year old male with end-stage renal disease on hemodialysis status post presumed diverticular hemorrhage who had a drop in his hemoglobin to 6.1. She hasn't has refused blood transfusions for religious beliefs. He is willing to continue with the Pridgen and iron infusions. Patient is hemodynamically stable and is asymptomatic. His recent hemoglobin was 10.7 and he has heme-negative stools. A shunt had fairly recent colonoscopy in 2012 with diverticular disease.  Patient has been reviewed with Dr. Deatra Ina and in the absence of a recurrent bleed, with heme-negative stools on exam today, it is felt colonoscopy is not warranted at this time. Should the patient experience a recurrent bleed, certainly colonoscopy would be indicated at that time. At this time however anemia likely multifactorial and has been responding nicely to erythropoietin in an Venofer. He should be able to resume heparin with his dialysis and will need to be observed for recurrent GI bleed.        Haylo Fake, Deloris Ping 09/23/2014,  CC Jamal Maes, M.D.

## 2014-09-24 DIAGNOSIS — D631 Anemia in chronic kidney disease: Secondary | ICD-10-CM | POA: Diagnosis not present

## 2014-09-24 DIAGNOSIS — E875 Hyperkalemia: Secondary | ICD-10-CM | POA: Diagnosis not present

## 2014-09-24 DIAGNOSIS — N186 End stage renal disease: Secondary | ICD-10-CM | POA: Diagnosis not present

## 2014-09-24 DIAGNOSIS — N2581 Secondary hyperparathyroidism of renal origin: Secondary | ICD-10-CM | POA: Diagnosis not present

## 2014-09-24 DIAGNOSIS — D509 Iron deficiency anemia, unspecified: Secondary | ICD-10-CM | POA: Diagnosis not present

## 2014-09-24 NOTE — Progress Notes (Signed)
Reviewed and agree with management. Lidya Mccalister D. Tigran Haynie, M.D., FACG  

## 2014-09-27 DIAGNOSIS — E875 Hyperkalemia: Secondary | ICD-10-CM | POA: Diagnosis not present

## 2014-09-27 DIAGNOSIS — D509 Iron deficiency anemia, unspecified: Secondary | ICD-10-CM | POA: Diagnosis not present

## 2014-09-27 DIAGNOSIS — D631 Anemia in chronic kidney disease: Secondary | ICD-10-CM | POA: Diagnosis not present

## 2014-09-27 DIAGNOSIS — N2581 Secondary hyperparathyroidism of renal origin: Secondary | ICD-10-CM | POA: Diagnosis not present

## 2014-09-27 DIAGNOSIS — N186 End stage renal disease: Secondary | ICD-10-CM | POA: Diagnosis not present

## 2014-09-29 DIAGNOSIS — N2581 Secondary hyperparathyroidism of renal origin: Secondary | ICD-10-CM | POA: Diagnosis not present

## 2014-09-29 DIAGNOSIS — N186 End stage renal disease: Secondary | ICD-10-CM | POA: Diagnosis not present

## 2014-09-29 DIAGNOSIS — D631 Anemia in chronic kidney disease: Secondary | ICD-10-CM | POA: Diagnosis not present

## 2014-09-29 DIAGNOSIS — D509 Iron deficiency anemia, unspecified: Secondary | ICD-10-CM | POA: Diagnosis not present

## 2014-09-29 DIAGNOSIS — E875 Hyperkalemia: Secondary | ICD-10-CM | POA: Diagnosis not present

## 2014-10-01 DIAGNOSIS — N186 End stage renal disease: Secondary | ICD-10-CM | POA: Diagnosis not present

## 2014-10-01 DIAGNOSIS — D509 Iron deficiency anemia, unspecified: Secondary | ICD-10-CM | POA: Diagnosis not present

## 2014-10-01 DIAGNOSIS — N2581 Secondary hyperparathyroidism of renal origin: Secondary | ICD-10-CM | POA: Diagnosis not present

## 2014-10-01 DIAGNOSIS — E875 Hyperkalemia: Secondary | ICD-10-CM | POA: Diagnosis not present

## 2014-10-01 DIAGNOSIS — D631 Anemia in chronic kidney disease: Secondary | ICD-10-CM | POA: Diagnosis not present

## 2014-10-04 DIAGNOSIS — D509 Iron deficiency anemia, unspecified: Secondary | ICD-10-CM | POA: Diagnosis not present

## 2014-10-04 DIAGNOSIS — N186 End stage renal disease: Secondary | ICD-10-CM | POA: Diagnosis not present

## 2014-10-04 DIAGNOSIS — E875 Hyperkalemia: Secondary | ICD-10-CM | POA: Diagnosis not present

## 2014-10-04 DIAGNOSIS — D631 Anemia in chronic kidney disease: Secondary | ICD-10-CM | POA: Diagnosis not present

## 2014-10-04 DIAGNOSIS — N2581 Secondary hyperparathyroidism of renal origin: Secondary | ICD-10-CM | POA: Diagnosis not present

## 2014-10-06 DIAGNOSIS — E875 Hyperkalemia: Secondary | ICD-10-CM | POA: Diagnosis not present

## 2014-10-06 DIAGNOSIS — N2581 Secondary hyperparathyroidism of renal origin: Secondary | ICD-10-CM | POA: Diagnosis not present

## 2014-10-06 DIAGNOSIS — D509 Iron deficiency anemia, unspecified: Secondary | ICD-10-CM | POA: Diagnosis not present

## 2014-10-06 DIAGNOSIS — N186 End stage renal disease: Secondary | ICD-10-CM | POA: Diagnosis not present

## 2014-10-06 DIAGNOSIS — D631 Anemia in chronic kidney disease: Secondary | ICD-10-CM | POA: Diagnosis not present

## 2014-10-08 DIAGNOSIS — D631 Anemia in chronic kidney disease: Secondary | ICD-10-CM | POA: Diagnosis not present

## 2014-10-08 DIAGNOSIS — N2581 Secondary hyperparathyroidism of renal origin: Secondary | ICD-10-CM | POA: Diagnosis not present

## 2014-10-08 DIAGNOSIS — E875 Hyperkalemia: Secondary | ICD-10-CM | POA: Diagnosis not present

## 2014-10-08 DIAGNOSIS — D509 Iron deficiency anemia, unspecified: Secondary | ICD-10-CM | POA: Diagnosis not present

## 2014-10-08 DIAGNOSIS — N186 End stage renal disease: Secondary | ICD-10-CM | POA: Diagnosis not present

## 2014-10-11 DIAGNOSIS — N186 End stage renal disease: Secondary | ICD-10-CM | POA: Diagnosis not present

## 2014-10-11 DIAGNOSIS — E875 Hyperkalemia: Secondary | ICD-10-CM | POA: Diagnosis not present

## 2014-10-11 DIAGNOSIS — D631 Anemia in chronic kidney disease: Secondary | ICD-10-CM | POA: Diagnosis not present

## 2014-10-11 DIAGNOSIS — N2581 Secondary hyperparathyroidism of renal origin: Secondary | ICD-10-CM | POA: Diagnosis not present

## 2014-10-11 DIAGNOSIS — D509 Iron deficiency anemia, unspecified: Secondary | ICD-10-CM | POA: Diagnosis not present

## 2014-10-13 DIAGNOSIS — D509 Iron deficiency anemia, unspecified: Secondary | ICD-10-CM | POA: Diagnosis not present

## 2014-10-13 DIAGNOSIS — Z992 Dependence on renal dialysis: Secondary | ICD-10-CM | POA: Diagnosis not present

## 2014-10-13 DIAGNOSIS — N2581 Secondary hyperparathyroidism of renal origin: Secondary | ICD-10-CM | POA: Diagnosis not present

## 2014-10-13 DIAGNOSIS — N186 End stage renal disease: Secondary | ICD-10-CM | POA: Diagnosis not present

## 2014-10-13 DIAGNOSIS — I12 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 stage renal disease: Secondary | ICD-10-CM | POA: Diagnosis not present

## 2014-10-13 DIAGNOSIS — D631 Anemia in chronic kidney disease: Secondary | ICD-10-CM | POA: Diagnosis not present

## 2014-10-13 DIAGNOSIS — E875 Hyperkalemia: Secondary | ICD-10-CM | POA: Diagnosis not present

## 2014-10-15 DIAGNOSIS — E1129 Type 2 diabetes mellitus with other diabetic kidney complication: Secondary | ICD-10-CM | POA: Diagnosis not present

## 2014-10-15 DIAGNOSIS — E875 Hyperkalemia: Secondary | ICD-10-CM | POA: Diagnosis not present

## 2014-10-15 DIAGNOSIS — N2581 Secondary hyperparathyroidism of renal origin: Secondary | ICD-10-CM | POA: Diagnosis not present

## 2014-10-15 DIAGNOSIS — D509 Iron deficiency anemia, unspecified: Secondary | ICD-10-CM | POA: Diagnosis not present

## 2014-10-15 DIAGNOSIS — N186 End stage renal disease: Secondary | ICD-10-CM | POA: Diagnosis not present

## 2014-10-15 DIAGNOSIS — D631 Anemia in chronic kidney disease: Secondary | ICD-10-CM | POA: Diagnosis not present

## 2014-10-18 DIAGNOSIS — N2581 Secondary hyperparathyroidism of renal origin: Secondary | ICD-10-CM | POA: Diagnosis not present

## 2014-10-18 DIAGNOSIS — N186 End stage renal disease: Secondary | ICD-10-CM | POA: Diagnosis not present

## 2014-10-18 DIAGNOSIS — D631 Anemia in chronic kidney disease: Secondary | ICD-10-CM | POA: Diagnosis not present

## 2014-10-18 DIAGNOSIS — E1129 Type 2 diabetes mellitus with other diabetic kidney complication: Secondary | ICD-10-CM | POA: Diagnosis not present

## 2014-10-18 DIAGNOSIS — E875 Hyperkalemia: Secondary | ICD-10-CM | POA: Diagnosis not present

## 2014-10-18 DIAGNOSIS — D509 Iron deficiency anemia, unspecified: Secondary | ICD-10-CM | POA: Diagnosis not present

## 2014-10-20 DIAGNOSIS — E875 Hyperkalemia: Secondary | ICD-10-CM | POA: Diagnosis not present

## 2014-10-20 DIAGNOSIS — N2581 Secondary hyperparathyroidism of renal origin: Secondary | ICD-10-CM | POA: Diagnosis not present

## 2014-10-20 DIAGNOSIS — D631 Anemia in chronic kidney disease: Secondary | ICD-10-CM | POA: Diagnosis not present

## 2014-10-20 DIAGNOSIS — D509 Iron deficiency anemia, unspecified: Secondary | ICD-10-CM | POA: Diagnosis not present

## 2014-10-20 DIAGNOSIS — N186 End stage renal disease: Secondary | ICD-10-CM | POA: Diagnosis not present

## 2014-10-20 DIAGNOSIS — E1129 Type 2 diabetes mellitus with other diabetic kidney complication: Secondary | ICD-10-CM | POA: Diagnosis not present

## 2014-10-22 DIAGNOSIS — E875 Hyperkalemia: Secondary | ICD-10-CM | POA: Diagnosis not present

## 2014-10-22 DIAGNOSIS — D509 Iron deficiency anemia, unspecified: Secondary | ICD-10-CM | POA: Diagnosis not present

## 2014-10-22 DIAGNOSIS — N186 End stage renal disease: Secondary | ICD-10-CM | POA: Diagnosis not present

## 2014-10-22 DIAGNOSIS — E1129 Type 2 diabetes mellitus with other diabetic kidney complication: Secondary | ICD-10-CM | POA: Diagnosis not present

## 2014-10-22 DIAGNOSIS — D631 Anemia in chronic kidney disease: Secondary | ICD-10-CM | POA: Diagnosis not present

## 2014-10-22 DIAGNOSIS — N2581 Secondary hyperparathyroidism of renal origin: Secondary | ICD-10-CM | POA: Diagnosis not present

## 2014-10-25 DIAGNOSIS — D509 Iron deficiency anemia, unspecified: Secondary | ICD-10-CM | POA: Diagnosis not present

## 2014-10-25 DIAGNOSIS — N2581 Secondary hyperparathyroidism of renal origin: Secondary | ICD-10-CM | POA: Diagnosis not present

## 2014-10-25 DIAGNOSIS — D631 Anemia in chronic kidney disease: Secondary | ICD-10-CM | POA: Diagnosis not present

## 2014-10-25 DIAGNOSIS — E875 Hyperkalemia: Secondary | ICD-10-CM | POA: Diagnosis not present

## 2014-10-25 DIAGNOSIS — E1129 Type 2 diabetes mellitus with other diabetic kidney complication: Secondary | ICD-10-CM | POA: Diagnosis not present

## 2014-10-25 DIAGNOSIS — N186 End stage renal disease: Secondary | ICD-10-CM | POA: Diagnosis not present

## 2014-10-27 DIAGNOSIS — N2581 Secondary hyperparathyroidism of renal origin: Secondary | ICD-10-CM | POA: Diagnosis not present

## 2014-10-27 DIAGNOSIS — N186 End stage renal disease: Secondary | ICD-10-CM | POA: Diagnosis not present

## 2014-10-27 DIAGNOSIS — D509 Iron deficiency anemia, unspecified: Secondary | ICD-10-CM | POA: Diagnosis not present

## 2014-10-27 DIAGNOSIS — D631 Anemia in chronic kidney disease: Secondary | ICD-10-CM | POA: Diagnosis not present

## 2014-10-27 DIAGNOSIS — E875 Hyperkalemia: Secondary | ICD-10-CM | POA: Diagnosis not present

## 2014-10-27 DIAGNOSIS — E1129 Type 2 diabetes mellitus with other diabetic kidney complication: Secondary | ICD-10-CM | POA: Diagnosis not present

## 2014-10-29 DIAGNOSIS — D631 Anemia in chronic kidney disease: Secondary | ICD-10-CM | POA: Diagnosis not present

## 2014-10-29 DIAGNOSIS — D509 Iron deficiency anemia, unspecified: Secondary | ICD-10-CM | POA: Diagnosis not present

## 2014-10-29 DIAGNOSIS — E1129 Type 2 diabetes mellitus with other diabetic kidney complication: Secondary | ICD-10-CM | POA: Diagnosis not present

## 2014-10-29 DIAGNOSIS — N186 End stage renal disease: Secondary | ICD-10-CM | POA: Diagnosis not present

## 2014-10-29 DIAGNOSIS — E875 Hyperkalemia: Secondary | ICD-10-CM | POA: Diagnosis not present

## 2014-10-29 DIAGNOSIS — N2581 Secondary hyperparathyroidism of renal origin: Secondary | ICD-10-CM | POA: Diagnosis not present

## 2014-11-01 DIAGNOSIS — E1129 Type 2 diabetes mellitus with other diabetic kidney complication: Secondary | ICD-10-CM | POA: Diagnosis not present

## 2014-11-01 DIAGNOSIS — E875 Hyperkalemia: Secondary | ICD-10-CM | POA: Diagnosis not present

## 2014-11-01 DIAGNOSIS — D509 Iron deficiency anemia, unspecified: Secondary | ICD-10-CM | POA: Diagnosis not present

## 2014-11-01 DIAGNOSIS — D631 Anemia in chronic kidney disease: Secondary | ICD-10-CM | POA: Diagnosis not present

## 2014-11-01 DIAGNOSIS — N186 End stage renal disease: Secondary | ICD-10-CM | POA: Diagnosis not present

## 2014-11-01 DIAGNOSIS — N2581 Secondary hyperparathyroidism of renal origin: Secondary | ICD-10-CM | POA: Diagnosis not present

## 2014-11-02 ENCOUNTER — Ambulatory Visit (INDEPENDENT_AMBULATORY_CARE_PROVIDER_SITE_OTHER): Payer: Medicare Other | Admitting: Neurology

## 2014-11-02 ENCOUNTER — Encounter: Payer: Self-pay | Admitting: Neurology

## 2014-11-02 VITALS — BP 116/75 | HR 92 | Ht 73.0 in | Wt 234.2 lb

## 2014-11-02 DIAGNOSIS — I639 Cerebral infarction, unspecified: Secondary | ICD-10-CM

## 2014-11-02 DIAGNOSIS — I151 Hypertension secondary to other renal disorders: Secondary | ICD-10-CM

## 2014-11-02 DIAGNOSIS — G039 Meningitis, unspecified: Secondary | ICD-10-CM

## 2014-11-02 DIAGNOSIS — N2889 Other specified disorders of kidney and ureter: Secondary | ICD-10-CM | POA: Diagnosis not present

## 2014-11-02 DIAGNOSIS — Z992 Dependence on renal dialysis: Secondary | ICD-10-CM

## 2014-11-02 DIAGNOSIS — N186 End stage renal disease: Secondary | ICD-10-CM | POA: Diagnosis not present

## 2014-11-02 DIAGNOSIS — G031 Chronic meningitis: Secondary | ICD-10-CM

## 2014-11-02 NOTE — Patient Instructions (Signed)
-   continue ASA and lipitor for stroke prevention - will repeat MRI brain next April for monitoring - Follow up with your primary care physician for stroke risk factor modification. Recommend maintain blood pressure goal <130/80, diabetes with hemoglobin A1c goal below 6.5% and lipids with LDL cholesterol goal below 70 mg/dL.  - follow up with nephrology for ESRD on HD - follow up with cardiology regularly - check BP at home - follow up in 6 months.

## 2014-11-03 DIAGNOSIS — D631 Anemia in chronic kidney disease: Secondary | ICD-10-CM | POA: Diagnosis not present

## 2014-11-03 DIAGNOSIS — E1129 Type 2 diabetes mellitus with other diabetic kidney complication: Secondary | ICD-10-CM | POA: Diagnosis not present

## 2014-11-03 DIAGNOSIS — N186 End stage renal disease: Secondary | ICD-10-CM | POA: Diagnosis not present

## 2014-11-03 DIAGNOSIS — E875 Hyperkalemia: Secondary | ICD-10-CM | POA: Diagnosis not present

## 2014-11-03 DIAGNOSIS — N2581 Secondary hyperparathyroidism of renal origin: Secondary | ICD-10-CM | POA: Diagnosis not present

## 2014-11-03 DIAGNOSIS — G039 Meningitis, unspecified: Secondary | ICD-10-CM | POA: Insufficient documentation

## 2014-11-03 DIAGNOSIS — G031 Chronic meningitis: Secondary | ICD-10-CM | POA: Insufficient documentation

## 2014-11-03 DIAGNOSIS — D509 Iron deficiency anemia, unspecified: Secondary | ICD-10-CM | POA: Diagnosis not present

## 2014-11-03 NOTE — Progress Notes (Signed)
STROKE NEUROLOGY FOLLOW UP NOTE  NAME: Carl Abdullahi Sr. DOB: 08/11/1960  REASON FOR VISIT: stroke follow up HISTORY FROM: pt and chart  Today we had the pleasure of seeing Carl Peete Sr. in follow-up at our Neurology Clinic. Pt was accompanied by no one.   History Summary Mr. Carl Deloza Sr. is a 54 y.o. male with history of HTN, ESRD on HD was admitted for acute onset of dizziness on 05/22/14. He was helping his brother for car repair and struck his head at the top to the hood. A couple of hours later, he felt very dizzy and not right. 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 Also, right occipital pole has tiny DWI changes which may related to PRES. He was admitted for further evaluation. Stroke work up including MRA, TTE and CUS unremarkable. LDL 91. During hospitalization, his dizziness resolved and he was discharged with ASA 325 and lipitor.   08/16/14 follow up - the patient has been doing well. No recurrent stroke symptoms or seizure. However, he had LGIB in 06/2014 and was admitted. Hb as low as to 5.4. He refused blood transfusion. His ASA was d/c'ed. LGIB gradually stopped and last check Hb was 10.4. Currently, he feels good without any abdominal pain and no bleeding. He is in the waiting list for kidney transplantation. BP today 130/86.  Interval History During the interval time, the patient has been doing well. No recurrent symptoms. His GIB resolved and he is on ASA without problems. BP 116/75 today. No HA or seizure. No vision changes.    REVIEW OF SYSTEMS: Full 14 system review of systems performed and notable only for those listed below and in HPI above, all others are negative:  Constitutional:   Cardiovascular:  Ear/Nose/Throat:   Skin: itching Eyes:  Eye itching Respiratory:   Gastroitestinal:   Genitourinary:  Hematology/Lymphatic:   Endocrine:    Musculoskeletal:   Allergy/Immunology:   Neurological:   Psychiatric:  Sleep:   The following represents the patient's updated allergies and side effects list: Allergies  Allergen Reactions  . Infed [Iron Dextran] Other (See Comments)    Decreased BP   . Oxycodone Nausea Only  . Vicodin [Hydrocodone-Acetaminophen] Other (See Comments)    Decreased BP  . Diclofenac Other (See Comments)    Unknown  . Whole Blood Other (See Comments)    Unk    The neurologically relevant items on the patient's problem list were reviewed on today's visit.  Neurologic Examination  A problem focused neurological exam (12 or more points of the single system neurologic examination, vital signs counts as 1 point, cranial nerves count for 8 points) was performed.  Blood pressure 116/75, pulse 92, height 6\' 1"  (1.854 m), weight 234 lb 3.2 oz (106.232 kg).  General - Well nourished, well developed, in no apparent distress.  Ophthalmologic - Sharp disc margins OU.   Cardiovascular - Regular rate and rhythm with no murmur.  Mental Status -  Level of arousal and orientation to time, place, and person were intact. Language including expression, naming, repetition, comprehension was assessed and found intact. Attention span and concentration were normal. Fund of Knowledge was assessed and was intact.  Cranial Nerves II - XII - II - Visual field intact OU. III, IV, VI - Extraocular movements intact. V - Facial sensation intact bilaterally. VII - Facial movement intact bilaterally. VIII - Hearing & vestibular intact  bilaterally. X - Palate elevates symmetrically. XI - Chin turning & shoulder shrug intact bilaterally. XII - Tongue protrusion intact.  Motor Strength - The patient's strength was normal in all extremities and pronator drift was absent.  Bulk was normal and fasciculations were absent.   Motor Tone - Muscle tone was assessed at the neck and appendages and was normal.  Reflexes - The  patient's reflexes were 1+ in all extremities and he had no pathological reflexes.  Sensory - Light touch, temperature/pinprick were assessed and were normal.    Coordination - The patient had normal movements in the hands and feet with no ataxia or dysmetria.  Tremor was absent.  Gait and Station - The patient's transfers, posture, gait, station, and turns were observed as normal.  Data reviewed: I personally reviewed the images and agree with the radiology interpretations.  Ct Head (brain) Wo Contrast 05/22/2014 Prominent dural calcification along the falx and tentorium again demonstrated. 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 No acute intracranial abnormalities. No significant change since previous study.   MRI HEAD  05/23/2014 Diffuse dural calcifications/dural thickening. Parietal-occipital FLAIR abnormality greater on the right suspicious for posterior reversible encephalopathy syndrome. Other potential causes for this appearance include that secondary to; artifact, meningitis, subarachnoid hemorrhage or result of external supplemental oxygen. Adequate flow within the superior sagittal sinus cannot be confirmed. This appearance may be chronic and related to the adjacent dural thickening however, sagittal sinus thrombosis could contribute to the parietal occipital FLAIR abnormality. Suggestion of tiny acute posterior left corona radiata infarct. Subtle restricted motion medial aspect of the right parietal -occipital lobe may represent result of posterior reversible encephalopathy syndrome or small infarct. Abnormal vessel extends from the left internal cerebral vein level along the left ambient cistern to the superior margin of the left internal auditory canal. Question small dural fistula. Opacification left petrous apex. Abnormal bone marrow may represent anemia of renal disease. 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 Left parotid 1.4 x 1 centimeter nonspecific lesion incompletely assessed  on present exam.   MRA HEAD  05/23/2014 Exam is motion degraded. Mild narrowing and irregularity right internal carotid artery cavernous segment without high-grade stenosis. Prominent left posterior communicating artery origin infundibulum without discrete aneurysm. Hypoplastic A1 segment right anterior cerebral artery. Azygous configuration A2 segment anterior cerebral artery. Middle cerebral artery mild to moderate branch vessel narrowing and irregularity. Non visualized left posterior inferior cerebellar artery. Mild narrowing distal right vertebral artery. Nonvisualized right anterior inferior cerebellar artery. Posterior cerebral artery branch vessel irregularity. Irregularity and narrowing superior cerebellar artery more notable on the right.   2D echo - - Left ventricle: The cavity size was normal. Systolic function was normal. The estimated ejection fraction was in the range of 55% to 60%. Wall motion was normal; there were no regional wall motion abnormalities. Doppler parameters are consistent with abnormal left ventricular relaxation (grade 1 diastolic dysfunction). - Pericardium, extracardiac: A trivial pericardial effusion was identified.  CUS - Bilateral: 1-39% ICA stenosis. Vertebral artery flow is antegrade.  MRI head 09/16/14 - Abnormal MRI scan of the brain showing diffuse dural thickening with abnormal appearance of bilateral parieto-occipital cortex on T2/FLAIR images the finding of unclear significance with a differential discussed above. Stable incidental findings of Tornwaldt's cysts, opacification of the petrous apex and abnormal vessel in the ambient cistern.  MRA head 09/23/14 - Equivocal MRA head (without) demonstrating: 1. Small (75mm) posterior outpouching of the left left supra-clinoid internal carotid artery, may represent infundibular dilation at origin of left  posterior communicating artery vs aneurysmal dilation. 2. No focal stenoses or  occlusions of the medium to large sized vessels.   MRV 09/16/14 - Highly abnormal appearance of superior sagittal sinus with diminutive caliber likely related to patient's chronic dural thickening. Abnormal appearance of deep cerebral venous system likely related to degenerative caliber from dural thickening versus partial thrombosis. Correlate with catheter angiogram if clinically necessary  Component     Latest Ref Rng 05/23/2014 07/14/2014  Cholesterol     0 - 200 mg/dL 154   Triglycerides     <150 mg/dL 50   HDL Cholesterol     >39 mg/dL 53   Total CHOL/HDL Ratio      2.9   VLDL     0 - 40 mg/dL 10   LDL (calc)     0 - 99 mg/dL 91   Hemoglobin A1C     4.8 - 5.6 % 5.4   Mean Plasma Glucose      108   HIV Screen 4th Generation wRfx     Non Reactive  Non Reactive  Vitamin B-12     180 - 914 pg/mL  1227 (H)    Assessment: As you may recall, he is a 54 y.o. African American male with PMH of HTN, ESRD on HD was admitted for acute onset of dizziness on 05/22/14. 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 Also, right occipital pole has tiny DWI changes which may related to PRES. Stroke work up including TTE and CUS unremarkable. LDL 91. His dizziness resolved and he was discharged with ASA 325 and lipitor. During the interval time, he developed LGIB, ASA was d/c'ed. His Hb now back to 10.4 and he feels good. Will need to resume baby ASA for stroke prevention. To document the resolution of PRES and to rule out CVT as there is some concern about cerebral veins in MRI, we repeated MRI, MRA and MRV and found that pt continued to have T2 hyperintensity bilateral parieto-occipital coretex and also has significant dural thickening with abnormal appearance of deep cerebral venous system with degenerative caliber, which may be able to explain the possible edema at bilateral parietooccipital region. This findings raise  concerns for chronic meningitis vs. Hypertrophic pachymeningitis as well as partial CVT. Not sure if there is any association with ESRD on HD. However, currently pt has no sign of chronic infection, no hydrocephalus, no complaint of HA or vision changes. Will continue to monitor and repeat MRI in 6 months.   Plan:  - continue ASA and lipitor for stroke prevention - will repeat MRI brain next April for monitoring - Follow up with your primary care physician for stroke risk factor modification. Recommend maintain blood pressure goal <130/80, diabetes with hemoglobin A1c goal below 6.5% and lipids with LDL cholesterol goal below 70 mg/dL.  - follow up with nephrology for ESRD on HD - follow up with cardiology and GI regularly - check BP at home - RTC in 6 months.  I spent more than 25 minutes of face to face time with the patient. Greater than 50% of time was spent in counseling and coordination of care. We have discussed about differential diagnosis of hypertrophic pachymeningitis and we decided to continue conservative measures and keep monitoring.    No orders of the defined types were placed in this encounter.    Meds ordered this encounter  Medications  . gabapentin (NEURONTIN) 100 MG  capsule    Sig:     Patient Instructions  - continue ASA and lipitor for stroke prevention - will repeat MRI brain next April for monitoring - Follow up with your primary care physician for stroke risk factor modification. Recommend maintain blood pressure goal <130/80, diabetes with hemoglobin A1c goal below 6.5% and lipids with LDL cholesterol goal below 70 mg/dL.  - follow up with nephrology for ESRD on HD - follow up with cardiology regularly - check BP at home - follow up in 6 months.    Rosalin Hawking, MD PhD [facility] [address] [place], [place] [address] [phone]

## 2014-11-05 DIAGNOSIS — N186 End stage renal disease: Secondary | ICD-10-CM | POA: Diagnosis not present

## 2014-11-05 DIAGNOSIS — D631 Anemia in chronic kidney disease: Secondary | ICD-10-CM | POA: Diagnosis not present

## 2014-11-05 DIAGNOSIS — E875 Hyperkalemia: Secondary | ICD-10-CM | POA: Diagnosis not present

## 2014-11-05 DIAGNOSIS — N2581 Secondary hyperparathyroidism of renal origin: Secondary | ICD-10-CM | POA: Diagnosis not present

## 2014-11-05 DIAGNOSIS — D509 Iron deficiency anemia, unspecified: Secondary | ICD-10-CM | POA: Diagnosis not present

## 2014-11-05 DIAGNOSIS — E1129 Type 2 diabetes mellitus with other diabetic kidney complication: Secondary | ICD-10-CM | POA: Diagnosis not present

## 2014-11-08 DIAGNOSIS — E875 Hyperkalemia: Secondary | ICD-10-CM | POA: Diagnosis not present

## 2014-11-08 DIAGNOSIS — N186 End stage renal disease: Secondary | ICD-10-CM | POA: Diagnosis not present

## 2014-11-08 DIAGNOSIS — D631 Anemia in chronic kidney disease: Secondary | ICD-10-CM | POA: Diagnosis not present

## 2014-11-08 DIAGNOSIS — N2581 Secondary hyperparathyroidism of renal origin: Secondary | ICD-10-CM | POA: Diagnosis not present

## 2014-11-08 DIAGNOSIS — D509 Iron deficiency anemia, unspecified: Secondary | ICD-10-CM | POA: Diagnosis not present

## 2014-11-08 DIAGNOSIS — E1129 Type 2 diabetes mellitus with other diabetic kidney complication: Secondary | ICD-10-CM | POA: Diagnosis not present

## 2014-11-10 DIAGNOSIS — E875 Hyperkalemia: Secondary | ICD-10-CM | POA: Diagnosis not present

## 2014-11-10 DIAGNOSIS — D509 Iron deficiency anemia, unspecified: Secondary | ICD-10-CM | POA: Diagnosis not present

## 2014-11-10 DIAGNOSIS — N186 End stage renal disease: Secondary | ICD-10-CM | POA: Diagnosis not present

## 2014-11-10 DIAGNOSIS — E1129 Type 2 diabetes mellitus with other diabetic kidney complication: Secondary | ICD-10-CM | POA: Diagnosis not present

## 2014-11-10 DIAGNOSIS — D631 Anemia in chronic kidney disease: Secondary | ICD-10-CM | POA: Diagnosis not present

## 2014-11-10 DIAGNOSIS — N2581 Secondary hyperparathyroidism of renal origin: Secondary | ICD-10-CM | POA: Diagnosis not present

## 2014-11-12 DIAGNOSIS — E875 Hyperkalemia: Secondary | ICD-10-CM | POA: Diagnosis not present

## 2014-11-12 DIAGNOSIS — Z992 Dependence on renal dialysis: Secondary | ICD-10-CM | POA: Diagnosis not present

## 2014-11-12 DIAGNOSIS — D509 Iron deficiency anemia, unspecified: Secondary | ICD-10-CM | POA: Diagnosis not present

## 2014-11-12 DIAGNOSIS — N186 End stage renal disease: Secondary | ICD-10-CM | POA: Diagnosis not present

## 2014-11-12 DIAGNOSIS — I12 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 stage renal disease: Secondary | ICD-10-CM | POA: Diagnosis not present

## 2014-11-12 DIAGNOSIS — D631 Anemia in chronic kidney disease: Secondary | ICD-10-CM | POA: Diagnosis not present

## 2014-11-12 DIAGNOSIS — E1129 Type 2 diabetes mellitus with other diabetic kidney complication: Secondary | ICD-10-CM | POA: Diagnosis not present

## 2014-11-12 DIAGNOSIS — N2581 Secondary hyperparathyroidism of renal origin: Secondary | ICD-10-CM | POA: Diagnosis not present

## 2014-11-15 DIAGNOSIS — E1129 Type 2 diabetes mellitus with other diabetic kidney complication: Secondary | ICD-10-CM | POA: Diagnosis not present

## 2014-11-15 DIAGNOSIS — Z23 Encounter for immunization: Secondary | ICD-10-CM | POA: Diagnosis not present

## 2014-11-15 DIAGNOSIS — E875 Hyperkalemia: Secondary | ICD-10-CM | POA: Diagnosis not present

## 2014-11-15 DIAGNOSIS — D509 Iron deficiency anemia, unspecified: Secondary | ICD-10-CM | POA: Diagnosis not present

## 2014-11-15 DIAGNOSIS — N186 End stage renal disease: Secondary | ICD-10-CM | POA: Diagnosis not present

## 2014-11-15 DIAGNOSIS — N2581 Secondary hyperparathyroidism of renal origin: Secondary | ICD-10-CM | POA: Diagnosis not present

## 2014-11-15 DIAGNOSIS — D631 Anemia in chronic kidney disease: Secondary | ICD-10-CM | POA: Diagnosis not present

## 2014-11-17 DIAGNOSIS — D509 Iron deficiency anemia, unspecified: Secondary | ICD-10-CM | POA: Diagnosis not present

## 2014-11-17 DIAGNOSIS — D631 Anemia in chronic kidney disease: Secondary | ICD-10-CM | POA: Diagnosis not present

## 2014-11-17 DIAGNOSIS — N186 End stage renal disease: Secondary | ICD-10-CM | POA: Diagnosis not present

## 2014-11-17 DIAGNOSIS — E875 Hyperkalemia: Secondary | ICD-10-CM | POA: Diagnosis not present

## 2014-11-17 DIAGNOSIS — N2581 Secondary hyperparathyroidism of renal origin: Secondary | ICD-10-CM | POA: Diagnosis not present

## 2014-11-17 DIAGNOSIS — E1129 Type 2 diabetes mellitus with other diabetic kidney complication: Secondary | ICD-10-CM | POA: Diagnosis not present

## 2014-11-19 DIAGNOSIS — E1129 Type 2 diabetes mellitus with other diabetic kidney complication: Secondary | ICD-10-CM | POA: Diagnosis not present

## 2014-11-19 DIAGNOSIS — E875 Hyperkalemia: Secondary | ICD-10-CM | POA: Diagnosis not present

## 2014-11-19 DIAGNOSIS — D631 Anemia in chronic kidney disease: Secondary | ICD-10-CM | POA: Diagnosis not present

## 2014-11-19 DIAGNOSIS — N2581 Secondary hyperparathyroidism of renal origin: Secondary | ICD-10-CM | POA: Diagnosis not present

## 2014-11-19 DIAGNOSIS — D509 Iron deficiency anemia, unspecified: Secondary | ICD-10-CM | POA: Diagnosis not present

## 2014-11-19 DIAGNOSIS — N186 End stage renal disease: Secondary | ICD-10-CM | POA: Diagnosis not present

## 2014-11-22 DIAGNOSIS — E875 Hyperkalemia: Secondary | ICD-10-CM | POA: Diagnosis not present

## 2014-11-22 DIAGNOSIS — D509 Iron deficiency anemia, unspecified: Secondary | ICD-10-CM | POA: Diagnosis not present

## 2014-11-22 DIAGNOSIS — D631 Anemia in chronic kidney disease: Secondary | ICD-10-CM | POA: Diagnosis not present

## 2014-11-22 DIAGNOSIS — E1129 Type 2 diabetes mellitus with other diabetic kidney complication: Secondary | ICD-10-CM | POA: Diagnosis not present

## 2014-11-22 DIAGNOSIS — N2581 Secondary hyperparathyroidism of renal origin: Secondary | ICD-10-CM | POA: Diagnosis not present

## 2014-11-22 DIAGNOSIS — N186 End stage renal disease: Secondary | ICD-10-CM | POA: Diagnosis not present

## 2014-11-24 DIAGNOSIS — N2581 Secondary hyperparathyroidism of renal origin: Secondary | ICD-10-CM | POA: Diagnosis not present

## 2014-11-24 DIAGNOSIS — E875 Hyperkalemia: Secondary | ICD-10-CM | POA: Diagnosis not present

## 2014-11-24 DIAGNOSIS — E1129 Type 2 diabetes mellitus with other diabetic kidney complication: Secondary | ICD-10-CM | POA: Diagnosis not present

## 2014-11-24 DIAGNOSIS — D631 Anemia in chronic kidney disease: Secondary | ICD-10-CM | POA: Diagnosis not present

## 2014-11-24 DIAGNOSIS — D509 Iron deficiency anemia, unspecified: Secondary | ICD-10-CM | POA: Diagnosis not present

## 2014-11-24 DIAGNOSIS — N186 End stage renal disease: Secondary | ICD-10-CM | POA: Diagnosis not present

## 2014-11-26 DIAGNOSIS — D631 Anemia in chronic kidney disease: Secondary | ICD-10-CM | POA: Diagnosis not present

## 2014-11-26 DIAGNOSIS — E875 Hyperkalemia: Secondary | ICD-10-CM | POA: Diagnosis not present

## 2014-11-26 DIAGNOSIS — E1129 Type 2 diabetes mellitus with other diabetic kidney complication: Secondary | ICD-10-CM | POA: Diagnosis not present

## 2014-11-26 DIAGNOSIS — D509 Iron deficiency anemia, unspecified: Secondary | ICD-10-CM | POA: Diagnosis not present

## 2014-11-26 DIAGNOSIS — N2581 Secondary hyperparathyroidism of renal origin: Secondary | ICD-10-CM | POA: Diagnosis not present

## 2014-11-26 DIAGNOSIS — N186 End stage renal disease: Secondary | ICD-10-CM | POA: Diagnosis not present

## 2014-11-29 DIAGNOSIS — D631 Anemia in chronic kidney disease: Secondary | ICD-10-CM | POA: Diagnosis not present

## 2014-11-29 DIAGNOSIS — D509 Iron deficiency anemia, unspecified: Secondary | ICD-10-CM | POA: Diagnosis not present

## 2014-11-29 DIAGNOSIS — E875 Hyperkalemia: Secondary | ICD-10-CM | POA: Diagnosis not present

## 2014-11-29 DIAGNOSIS — E1129 Type 2 diabetes mellitus with other diabetic kidney complication: Secondary | ICD-10-CM | POA: Diagnosis not present

## 2014-11-29 DIAGNOSIS — N2581 Secondary hyperparathyroidism of renal origin: Secondary | ICD-10-CM | POA: Diagnosis not present

## 2014-11-29 DIAGNOSIS — N186 End stage renal disease: Secondary | ICD-10-CM | POA: Diagnosis not present

## 2014-12-01 DIAGNOSIS — E875 Hyperkalemia: Secondary | ICD-10-CM | POA: Diagnosis not present

## 2014-12-01 DIAGNOSIS — E1129 Type 2 diabetes mellitus with other diabetic kidney complication: Secondary | ICD-10-CM | POA: Diagnosis not present

## 2014-12-01 DIAGNOSIS — D509 Iron deficiency anemia, unspecified: Secondary | ICD-10-CM | POA: Diagnosis not present

## 2014-12-01 DIAGNOSIS — N186 End stage renal disease: Secondary | ICD-10-CM | POA: Diagnosis not present

## 2014-12-01 DIAGNOSIS — N2581 Secondary hyperparathyroidism of renal origin: Secondary | ICD-10-CM | POA: Diagnosis not present

## 2014-12-01 DIAGNOSIS — D631 Anemia in chronic kidney disease: Secondary | ICD-10-CM | POA: Diagnosis not present

## 2014-12-03 DIAGNOSIS — D631 Anemia in chronic kidney disease: Secondary | ICD-10-CM | POA: Diagnosis not present

## 2014-12-03 DIAGNOSIS — E875 Hyperkalemia: Secondary | ICD-10-CM | POA: Diagnosis not present

## 2014-12-03 DIAGNOSIS — D509 Iron deficiency anemia, unspecified: Secondary | ICD-10-CM | POA: Diagnosis not present

## 2014-12-03 DIAGNOSIS — N2581 Secondary hyperparathyroidism of renal origin: Secondary | ICD-10-CM | POA: Diagnosis not present

## 2014-12-03 DIAGNOSIS — E1129 Type 2 diabetes mellitus with other diabetic kidney complication: Secondary | ICD-10-CM | POA: Diagnosis not present

## 2014-12-03 DIAGNOSIS — N186 End stage renal disease: Secondary | ICD-10-CM | POA: Diagnosis not present

## 2014-12-06 DIAGNOSIS — D509 Iron deficiency anemia, unspecified: Secondary | ICD-10-CM | POA: Diagnosis not present

## 2014-12-06 DIAGNOSIS — N2581 Secondary hyperparathyroidism of renal origin: Secondary | ICD-10-CM | POA: Diagnosis not present

## 2014-12-06 DIAGNOSIS — E875 Hyperkalemia: Secondary | ICD-10-CM | POA: Diagnosis not present

## 2014-12-06 DIAGNOSIS — N186 End stage renal disease: Secondary | ICD-10-CM | POA: Diagnosis not present

## 2014-12-06 DIAGNOSIS — D631 Anemia in chronic kidney disease: Secondary | ICD-10-CM | POA: Diagnosis not present

## 2014-12-06 DIAGNOSIS — E1129 Type 2 diabetes mellitus with other diabetic kidney complication: Secondary | ICD-10-CM | POA: Diagnosis not present

## 2014-12-08 DIAGNOSIS — D509 Iron deficiency anemia, unspecified: Secondary | ICD-10-CM | POA: Diagnosis not present

## 2014-12-08 DIAGNOSIS — N2581 Secondary hyperparathyroidism of renal origin: Secondary | ICD-10-CM | POA: Diagnosis not present

## 2014-12-08 DIAGNOSIS — E1129 Type 2 diabetes mellitus with other diabetic kidney complication: Secondary | ICD-10-CM | POA: Diagnosis not present

## 2014-12-08 DIAGNOSIS — D631 Anemia in chronic kidney disease: Secondary | ICD-10-CM | POA: Diagnosis not present

## 2014-12-08 DIAGNOSIS — E875 Hyperkalemia: Secondary | ICD-10-CM | POA: Diagnosis not present

## 2014-12-08 DIAGNOSIS — N186 End stage renal disease: Secondary | ICD-10-CM | POA: Diagnosis not present

## 2014-12-10 DIAGNOSIS — N186 End stage renal disease: Secondary | ICD-10-CM | POA: Diagnosis not present

## 2014-12-10 DIAGNOSIS — N2581 Secondary hyperparathyroidism of renal origin: Secondary | ICD-10-CM | POA: Diagnosis not present

## 2014-12-10 DIAGNOSIS — E1129 Type 2 diabetes mellitus with other diabetic kidney complication: Secondary | ICD-10-CM | POA: Diagnosis not present

## 2014-12-10 DIAGNOSIS — D509 Iron deficiency anemia, unspecified: Secondary | ICD-10-CM | POA: Diagnosis not present

## 2014-12-10 DIAGNOSIS — E875 Hyperkalemia: Secondary | ICD-10-CM | POA: Diagnosis not present

## 2014-12-10 DIAGNOSIS — D631 Anemia in chronic kidney disease: Secondary | ICD-10-CM | POA: Diagnosis not present

## 2014-12-13 DIAGNOSIS — N2581 Secondary hyperparathyroidism of renal origin: Secondary | ICD-10-CM | POA: Diagnosis not present

## 2014-12-13 DIAGNOSIS — N186 End stage renal disease: Secondary | ICD-10-CM | POA: Diagnosis not present

## 2014-12-13 DIAGNOSIS — Z992 Dependence on renal dialysis: Secondary | ICD-10-CM | POA: Diagnosis not present

## 2014-12-13 DIAGNOSIS — E875 Hyperkalemia: Secondary | ICD-10-CM | POA: Diagnosis not present

## 2014-12-13 DIAGNOSIS — D631 Anemia in chronic kidney disease: Secondary | ICD-10-CM | POA: Diagnosis not present

## 2014-12-13 DIAGNOSIS — D509 Iron deficiency anemia, unspecified: Secondary | ICD-10-CM | POA: Diagnosis not present

## 2014-12-13 DIAGNOSIS — E1129 Type 2 diabetes mellitus with other diabetic kidney complication: Secondary | ICD-10-CM | POA: Diagnosis not present

## 2014-12-13 DIAGNOSIS — I12 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 stage renal disease: Secondary | ICD-10-CM | POA: Diagnosis not present

## 2014-12-15 DIAGNOSIS — E875 Hyperkalemia: Secondary | ICD-10-CM | POA: Diagnosis not present

## 2014-12-15 DIAGNOSIS — N186 End stage renal disease: Secondary | ICD-10-CM | POA: Diagnosis not present

## 2014-12-15 DIAGNOSIS — N2581 Secondary hyperparathyroidism of renal origin: Secondary | ICD-10-CM | POA: Diagnosis not present

## 2014-12-15 DIAGNOSIS — D509 Iron deficiency anemia, unspecified: Secondary | ICD-10-CM | POA: Diagnosis not present

## 2014-12-15 DIAGNOSIS — D631 Anemia in chronic kidney disease: Secondary | ICD-10-CM | POA: Diagnosis not present

## 2014-12-15 DIAGNOSIS — E1129 Type 2 diabetes mellitus with other diabetic kidney complication: Secondary | ICD-10-CM | POA: Diagnosis not present

## 2014-12-17 DIAGNOSIS — D631 Anemia in chronic kidney disease: Secondary | ICD-10-CM | POA: Diagnosis not present

## 2014-12-17 DIAGNOSIS — E1129 Type 2 diabetes mellitus with other diabetic kidney complication: Secondary | ICD-10-CM | POA: Diagnosis not present

## 2014-12-17 DIAGNOSIS — N186 End stage renal disease: Secondary | ICD-10-CM | POA: Diagnosis not present

## 2014-12-17 DIAGNOSIS — E875 Hyperkalemia: Secondary | ICD-10-CM | POA: Diagnosis not present

## 2014-12-17 DIAGNOSIS — N2581 Secondary hyperparathyroidism of renal origin: Secondary | ICD-10-CM | POA: Diagnosis not present

## 2014-12-17 DIAGNOSIS — D509 Iron deficiency anemia, unspecified: Secondary | ICD-10-CM | POA: Diagnosis not present

## 2014-12-20 DIAGNOSIS — E1129 Type 2 diabetes mellitus with other diabetic kidney complication: Secondary | ICD-10-CM | POA: Diagnosis not present

## 2014-12-20 DIAGNOSIS — D509 Iron deficiency anemia, unspecified: Secondary | ICD-10-CM | POA: Diagnosis not present

## 2014-12-20 DIAGNOSIS — N2581 Secondary hyperparathyroidism of renal origin: Secondary | ICD-10-CM | POA: Diagnosis not present

## 2014-12-20 DIAGNOSIS — N186 End stage renal disease: Secondary | ICD-10-CM | POA: Diagnosis not present

## 2014-12-20 DIAGNOSIS — D631 Anemia in chronic kidney disease: Secondary | ICD-10-CM | POA: Diagnosis not present

## 2014-12-20 DIAGNOSIS — E875 Hyperkalemia: Secondary | ICD-10-CM | POA: Diagnosis not present

## 2014-12-22 DIAGNOSIS — E1129 Type 2 diabetes mellitus with other diabetic kidney complication: Secondary | ICD-10-CM | POA: Diagnosis not present

## 2014-12-22 DIAGNOSIS — D631 Anemia in chronic kidney disease: Secondary | ICD-10-CM | POA: Diagnosis not present

## 2014-12-22 DIAGNOSIS — N186 End stage renal disease: Secondary | ICD-10-CM | POA: Diagnosis not present

## 2014-12-22 DIAGNOSIS — N2581 Secondary hyperparathyroidism of renal origin: Secondary | ICD-10-CM | POA: Diagnosis not present

## 2014-12-22 DIAGNOSIS — D509 Iron deficiency anemia, unspecified: Secondary | ICD-10-CM | POA: Diagnosis not present

## 2014-12-22 DIAGNOSIS — E875 Hyperkalemia: Secondary | ICD-10-CM | POA: Diagnosis not present

## 2014-12-24 DIAGNOSIS — D509 Iron deficiency anemia, unspecified: Secondary | ICD-10-CM | POA: Diagnosis not present

## 2014-12-24 DIAGNOSIS — N2581 Secondary hyperparathyroidism of renal origin: Secondary | ICD-10-CM | POA: Diagnosis not present

## 2014-12-24 DIAGNOSIS — D631 Anemia in chronic kidney disease: Secondary | ICD-10-CM | POA: Diagnosis not present

## 2014-12-24 DIAGNOSIS — N186 End stage renal disease: Secondary | ICD-10-CM | POA: Diagnosis not present

## 2014-12-24 DIAGNOSIS — E875 Hyperkalemia: Secondary | ICD-10-CM | POA: Diagnosis not present

## 2014-12-24 DIAGNOSIS — E1129 Type 2 diabetes mellitus with other diabetic kidney complication: Secondary | ICD-10-CM | POA: Diagnosis not present

## 2014-12-27 DIAGNOSIS — D631 Anemia in chronic kidney disease: Secondary | ICD-10-CM | POA: Diagnosis not present

## 2014-12-27 DIAGNOSIS — E1129 Type 2 diabetes mellitus with other diabetic kidney complication: Secondary | ICD-10-CM | POA: Diagnosis not present

## 2014-12-27 DIAGNOSIS — N2581 Secondary hyperparathyroidism of renal origin: Secondary | ICD-10-CM | POA: Diagnosis not present

## 2014-12-27 DIAGNOSIS — D509 Iron deficiency anemia, unspecified: Secondary | ICD-10-CM | POA: Diagnosis not present

## 2014-12-27 DIAGNOSIS — N186 End stage renal disease: Secondary | ICD-10-CM | POA: Diagnosis not present

## 2014-12-27 DIAGNOSIS — E875 Hyperkalemia: Secondary | ICD-10-CM | POA: Diagnosis not present

## 2014-12-29 DIAGNOSIS — D631 Anemia in chronic kidney disease: Secondary | ICD-10-CM | POA: Diagnosis not present

## 2014-12-29 DIAGNOSIS — N186 End stage renal disease: Secondary | ICD-10-CM | POA: Diagnosis not present

## 2014-12-29 DIAGNOSIS — E1129 Type 2 diabetes mellitus with other diabetic kidney complication: Secondary | ICD-10-CM | POA: Diagnosis not present

## 2014-12-29 DIAGNOSIS — E875 Hyperkalemia: Secondary | ICD-10-CM | POA: Diagnosis not present

## 2014-12-29 DIAGNOSIS — D509 Iron deficiency anemia, unspecified: Secondary | ICD-10-CM | POA: Diagnosis not present

## 2014-12-29 DIAGNOSIS — N2581 Secondary hyperparathyroidism of renal origin: Secondary | ICD-10-CM | POA: Diagnosis not present

## 2014-12-31 DIAGNOSIS — E1129 Type 2 diabetes mellitus with other diabetic kidney complication: Secondary | ICD-10-CM | POA: Diagnosis not present

## 2014-12-31 DIAGNOSIS — D631 Anemia in chronic kidney disease: Secondary | ICD-10-CM | POA: Diagnosis not present

## 2014-12-31 DIAGNOSIS — N2581 Secondary hyperparathyroidism of renal origin: Secondary | ICD-10-CM | POA: Diagnosis not present

## 2014-12-31 DIAGNOSIS — N186 End stage renal disease: Secondary | ICD-10-CM | POA: Diagnosis not present

## 2014-12-31 DIAGNOSIS — D509 Iron deficiency anemia, unspecified: Secondary | ICD-10-CM | POA: Diagnosis not present

## 2014-12-31 DIAGNOSIS — E875 Hyperkalemia: Secondary | ICD-10-CM | POA: Diagnosis not present

## 2015-01-03 DIAGNOSIS — N186 End stage renal disease: Secondary | ICD-10-CM | POA: Diagnosis not present

## 2015-01-03 DIAGNOSIS — E1129 Type 2 diabetes mellitus with other diabetic kidney complication: Secondary | ICD-10-CM | POA: Diagnosis not present

## 2015-01-03 DIAGNOSIS — E875 Hyperkalemia: Secondary | ICD-10-CM | POA: Diagnosis not present

## 2015-01-03 DIAGNOSIS — N2581 Secondary hyperparathyroidism of renal origin: Secondary | ICD-10-CM | POA: Diagnosis not present

## 2015-01-03 DIAGNOSIS — D509 Iron deficiency anemia, unspecified: Secondary | ICD-10-CM | POA: Diagnosis not present

## 2015-01-03 DIAGNOSIS — D631 Anemia in chronic kidney disease: Secondary | ICD-10-CM | POA: Diagnosis not present

## 2015-01-05 DIAGNOSIS — E875 Hyperkalemia: Secondary | ICD-10-CM | POA: Diagnosis not present

## 2015-01-05 DIAGNOSIS — N186 End stage renal disease: Secondary | ICD-10-CM | POA: Diagnosis not present

## 2015-01-05 DIAGNOSIS — E1129 Type 2 diabetes mellitus with other diabetic kidney complication: Secondary | ICD-10-CM | POA: Diagnosis not present

## 2015-01-05 DIAGNOSIS — D631 Anemia in chronic kidney disease: Secondary | ICD-10-CM | POA: Diagnosis not present

## 2015-01-05 DIAGNOSIS — D509 Iron deficiency anemia, unspecified: Secondary | ICD-10-CM | POA: Diagnosis not present

## 2015-01-05 DIAGNOSIS — N2581 Secondary hyperparathyroidism of renal origin: Secondary | ICD-10-CM | POA: Diagnosis not present

## 2015-01-08 DIAGNOSIS — E1129 Type 2 diabetes mellitus with other diabetic kidney complication: Secondary | ICD-10-CM | POA: Diagnosis not present

## 2015-01-08 DIAGNOSIS — E875 Hyperkalemia: Secondary | ICD-10-CM | POA: Diagnosis not present

## 2015-01-08 DIAGNOSIS — D509 Iron deficiency anemia, unspecified: Secondary | ICD-10-CM | POA: Diagnosis not present

## 2015-01-08 DIAGNOSIS — D631 Anemia in chronic kidney disease: Secondary | ICD-10-CM | POA: Diagnosis not present

## 2015-01-08 DIAGNOSIS — N186 End stage renal disease: Secondary | ICD-10-CM | POA: Diagnosis not present

## 2015-01-08 DIAGNOSIS — N2581 Secondary hyperparathyroidism of renal origin: Secondary | ICD-10-CM | POA: Diagnosis not present

## 2015-01-10 DIAGNOSIS — D631 Anemia in chronic kidney disease: Secondary | ICD-10-CM | POA: Diagnosis not present

## 2015-01-10 DIAGNOSIS — E875 Hyperkalemia: Secondary | ICD-10-CM | POA: Diagnosis not present

## 2015-01-10 DIAGNOSIS — N186 End stage renal disease: Secondary | ICD-10-CM | POA: Diagnosis not present

## 2015-01-10 DIAGNOSIS — N2581 Secondary hyperparathyroidism of renal origin: Secondary | ICD-10-CM | POA: Diagnosis not present

## 2015-01-10 DIAGNOSIS — D509 Iron deficiency anemia, unspecified: Secondary | ICD-10-CM | POA: Diagnosis not present

## 2015-01-10 DIAGNOSIS — E1129 Type 2 diabetes mellitus with other diabetic kidney complication: Secondary | ICD-10-CM | POA: Diagnosis not present

## 2015-01-12 ENCOUNTER — Ambulatory Visit (INDEPENDENT_AMBULATORY_CARE_PROVIDER_SITE_OTHER): Payer: Medicare Other | Admitting: Internal Medicine

## 2015-01-12 ENCOUNTER — Encounter: Payer: Self-pay | Admitting: Internal Medicine

## 2015-01-12 VITALS — BP 98/68 | HR 108 | Ht 73.0 in | Wt 224.4 lb

## 2015-01-12 DIAGNOSIS — D631 Anemia in chronic kidney disease: Secondary | ICD-10-CM | POA: Diagnosis not present

## 2015-01-12 DIAGNOSIS — N2581 Secondary hyperparathyroidism of renal origin: Secondary | ICD-10-CM | POA: Diagnosis not present

## 2015-01-12 DIAGNOSIS — Z992 Dependence on renal dialysis: Secondary | ICD-10-CM

## 2015-01-12 DIAGNOSIS — E875 Hyperkalemia: Secondary | ICD-10-CM | POA: Diagnosis not present

## 2015-01-12 DIAGNOSIS — E785 Hyperlipidemia, unspecified: Secondary | ICD-10-CM | POA: Diagnosis not present

## 2015-01-12 DIAGNOSIS — N2889 Other specified disorders of kidney and ureter: Secondary | ICD-10-CM

## 2015-01-12 DIAGNOSIS — I10 Essential (primary) hypertension: Secondary | ICD-10-CM | POA: Diagnosis not present

## 2015-01-12 DIAGNOSIS — I639 Cerebral infarction, unspecified: Secondary | ICD-10-CM

## 2015-01-12 DIAGNOSIS — Z8673 Personal history of transient ischemic attack (TIA), and cerebral infarction without residual deficits: Secondary | ICD-10-CM | POA: Diagnosis not present

## 2015-01-12 DIAGNOSIS — N186 End stage renal disease: Secondary | ICD-10-CM | POA: Diagnosis not present

## 2015-01-12 DIAGNOSIS — E1129 Type 2 diabetes mellitus with other diabetic kidney complication: Secondary | ICD-10-CM | POA: Diagnosis not present

## 2015-01-12 DIAGNOSIS — I6783 Posterior reversible encephalopathy syndrome: Secondary | ICD-10-CM

## 2015-01-12 DIAGNOSIS — I151 Hypertension secondary to other renal disorders: Secondary | ICD-10-CM

## 2015-01-12 DIAGNOSIS — I12 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 stage renal disease: Secondary | ICD-10-CM | POA: Diagnosis not present

## 2015-01-12 DIAGNOSIS — D509 Iron deficiency anemia, unspecified: Secondary | ICD-10-CM | POA: Diagnosis not present

## 2015-01-12 NOTE — Progress Notes (Signed)
OFFICE NOTE  Chief Complaint:  No complaints  Primary Care Physician: Lucrezia Starch, MD  HPI:  Delson Joens Sr. is a pleasant 54 year old male with a history of chronic kidney disease who is currently undergoing evaluation for renal transplant. He does have end-stage renal disease on dialysis every Monday, Wednesday and Friday. Recently he was in the hospital for possible stroke however it is unclear whether this actually occurred or whether his imaging findings could be consistent with PRES. he had carotid Dopplers which showed a mild amount of carotid plaque. He was started on low-dose Lipitor based on a fairly favorable cholesterol profile. He does have a history of stress testing several years ago in 2012 which was negative for ischemia. He tells me that he also had a stress test at [facility] last year which was negative as part of his transplant workup. He denies any chest pain or shortness of breath with exertion. He does have low blood pressure at dialysis and is been taken off of blood pressure medications. He tends to run a little bit fast with regards to heart rate was thought this is related to anxiety.  I had the pleasure seeing Mr. Castleberry back today in the office. Overall he is doing fairly well. He's had no further cerebrovascular events although he does get headache from time to time. Blood pressure is actually low today at 98/68. Generally his blood pressure has run high. Heart rate is over 100. He did have dialysis however this morning. He says that after dialysis his blood pressure tends to run a little low and heart rate accordingly elevated. Generally he is doing well and denies any chest pain or worsening shortness of breath.  PMHx:  Past Medical History  Diagnosis Date  . Hypertension   . Anemia   . Chronic headaches   . Sleep apnea   . Chronic kidney disease     Chronic Renal Failure; On Renal Transplant List  . Presence of arteriovenous fistula for  hemodialysis, primary ([place])     RUE  . Hemodialysis patient [facility])     M-W-F    Past Surgical History  Procedure Laterality Date  . Av fistula placement Right     right arm  . Graft left arm Left     for dialysis x 2  . Insertion of dialysis catheter      Rt chest    FAMHx:  Family History  Problem Relation Age of Onset  . Diabetes Father   . Stroke Father   . Hypertension Father   . Uterine cancer Mother   . Lupus Sister   . Stroke Sister   . Anuerysm Brother     brain  . Hypertension Sister     SOCHx:   reports that he quit smoking about 24 years ago. He has never used smokeless tobacco. He reports that he drinks about 0.6 oz of alcohol per week. He reports that he does not use illicit drugs.  ALLERGIES:  Allergies  Allergen Reactions  . Infed [Iron Dextran] Other (See Comments)    Decreased BP   . Oxycodone Nausea Only  . Vicodin [Hydrocodone-Acetaminophen] Other (See Comments)    Decreased BP  . Diclofenac Other (See Comments)    Unknown  . Whole Blood Other (See Comments)    Unk    ROS: A comprehensive review of systems was negative except for: Cardiovascular: positive for lower extremity edema  HOME MEDS: Current Outpatient Prescriptions  Medication Sig Dispense Refill  .  aspirin EC 81 MG tablet Take 1 tablet (81 mg total) by mouth daily. 90 tablet 3  . atorvastatin (LIPITOR) 10 MG tablet Take 1 tablet (10 mg total) by mouth daily at 6 PM. 30 tablet 0  . calcium acetate (PHOSLO) 667 MG capsule Take 3,335 mg by mouth 3 (three) times daily with meals.     . cinacalcet (SENSIPAR) 60 MG tablet Take 120 mg by mouth daily.     . Darbepoetin Alfa (ARANESP) 200 MCG/0.4ML SOSY injection Inject 0.4 mLs (200 mcg total) into the vein every Friday with hemodialysis. 1.68 mL   . gabapentin (NEURONTIN) 100 MG capsule Take 100 mg by mouth 2 (two) times daily.     Marland Kitchen ibuprofen (ADVIL,MOTRIN) 800 MG tablet Take 800 mg by mouth 2 (two) times daily.    Marland Kitchen lanthanum  (FOSRENOL) 1000 MG chewable tablet Chew 1,000 mg by mouth 3 (three) times daily with meals.    . Multiple Vitamin (MULTI VITAMIN MENS PO) Take 1 tablet by mouth daily.      . sorbitol 70 % solution Take 15-60 mLs by mouth daily as needed (dialysis days).      No current facility-administered medications for this visit.    LABS/IMAGING: No results found for this or any previous visit (from the past 48 hour(s)). No results found.  WEIGHTS: Wt Readings from Last 3 Encounters:  01/12/15 224 lb 6.4 oz (101.787 kg)  11/02/14 234 lb 3.2 oz (106.232 kg)  09/23/14 238 lb (107.956 kg)    VITALS: BP 98/68 mmHg  Pulse 108  Ht 6\' 1"  (1.854 m)  Wt 224 lb 6.4 oz (101.787 kg)  BMI 29.61 kg/m2  SpO2 97%  EXAM: General appearance: alert and no distress Neck: no carotid bruit and no JVD Lungs: clear to auscultation bilaterally Heart: regular rate and rhythm, S1, S2 normal, no murmur, click, rub or gallop Abdomen: soft, non-tender; bowel sounds normal; no masses,  no organomegaly Extremities: Right forearm fistula, positive thrill Pulses: 2+ and symmetric Skin: Skin color, texture, turgor normal. No rashes or lesions Neurologic: Grossly normal Psych: Pleasant  EKG: Deferred  ASSESSMENT: 1. Recent neurological event-possible stroke, imaging findings may be consistent with PRES 2. End-stage renal disease on HD (M, W, F) 3. Dyslipidemia 4. Mild carotid artery stenosis  PLAN: 1.   Mr. Blaes seems to be doing well without any new neurologic deficits. He will need a repeat of his carotid ultrasounds after April 2017. In addition he is due for a repeat lipid profile. We may need to make further adjustments on his atorvastatin. Otherwise I would not make any further medicine changes. Blood pressure will not allow addition of any medicine such as beta blockers. Plan to see him back in 6 months after his carotid Dopplers.  Pixie Casino, MD, [facility] Attending Cardiologist [facility]ells C Darroll Bredeson 01/12/2015, 5:18 PM

## 2015-01-12 NOTE — Patient Instructions (Signed)
Your physician recommends that you return for lab work in: [place] has requested that you have a carotid duplex. This test is an ultrasound of the carotid arteries in your neck. It looks at blood flow through these arteries that supply the brain with blood. Allow one hour for this exam. There are no restrictions or special instructions. This will Be done in April 2017.  Your physician wants you to follow-up in: June 2017 with Dr Debara Pickett. You will receive a reminder letter in the mail two months in advance. If you don't receive a letter, please call our office to schedule the follow-up appointment.  If you need a refill on your cardiac medications before your next appointment, please call your pharmacy.

## 2015-01-14 DIAGNOSIS — N2581 Secondary hyperparathyroidism of renal origin: Secondary | ICD-10-CM | POA: Diagnosis not present

## 2015-01-14 DIAGNOSIS — N186 End stage renal disease: Secondary | ICD-10-CM | POA: Diagnosis not present

## 2015-01-14 DIAGNOSIS — E1129 Type 2 diabetes mellitus with other diabetic kidney complication: Secondary | ICD-10-CM | POA: Diagnosis not present

## 2015-01-14 DIAGNOSIS — E875 Hyperkalemia: Secondary | ICD-10-CM | POA: Diagnosis not present

## 2015-01-14 DIAGNOSIS — D631 Anemia in chronic kidney disease: Secondary | ICD-10-CM | POA: Diagnosis not present

## 2015-01-17 DIAGNOSIS — E875 Hyperkalemia: Secondary | ICD-10-CM | POA: Diagnosis not present

## 2015-01-17 DIAGNOSIS — N186 End stage renal disease: Secondary | ICD-10-CM | POA: Diagnosis not present

## 2015-01-17 DIAGNOSIS — N2581 Secondary hyperparathyroidism of renal origin: Secondary | ICD-10-CM | POA: Diagnosis not present

## 2015-01-17 DIAGNOSIS — E1129 Type 2 diabetes mellitus with other diabetic kidney complication: Secondary | ICD-10-CM | POA: Diagnosis not present

## 2015-01-17 DIAGNOSIS — D631 Anemia in chronic kidney disease: Secondary | ICD-10-CM | POA: Diagnosis not present

## 2015-01-18 DIAGNOSIS — E785 Hyperlipidemia, unspecified: Secondary | ICD-10-CM | POA: Diagnosis not present

## 2015-01-19 DIAGNOSIS — N2581 Secondary hyperparathyroidism of renal origin: Secondary | ICD-10-CM | POA: Diagnosis not present

## 2015-01-19 DIAGNOSIS — E875 Hyperkalemia: Secondary | ICD-10-CM | POA: Diagnosis not present

## 2015-01-19 DIAGNOSIS — E1129 Type 2 diabetes mellitus with other diabetic kidney complication: Secondary | ICD-10-CM | POA: Diagnosis not present

## 2015-01-19 DIAGNOSIS — N186 End stage renal disease: Secondary | ICD-10-CM | POA: Diagnosis not present

## 2015-01-19 DIAGNOSIS — D631 Anemia in chronic kidney disease: Secondary | ICD-10-CM | POA: Diagnosis not present

## 2015-01-19 LAB — LIPID PANEL
Cholesterol: 159 mg/dL (ref 125–200)
HDL: 63 mg/dL (ref >=40)
LDL Cholesterol: 81 mg/dL (ref <130)
Total CHOL/HDL Ratio: 2.5 Ratio (ref <=5.0)
Triglycerides: 75 mg/dL (ref <150)
VLDL: 15 mg/dL (ref <30)

## 2015-01-21 DIAGNOSIS — D631 Anemia in chronic kidney disease: Secondary | ICD-10-CM | POA: Diagnosis not present

## 2015-01-21 DIAGNOSIS — E1129 Type 2 diabetes mellitus with other diabetic kidney complication: Secondary | ICD-10-CM | POA: Diagnosis not present

## 2015-01-21 DIAGNOSIS — E875 Hyperkalemia: Secondary | ICD-10-CM | POA: Diagnosis not present

## 2015-01-21 DIAGNOSIS — N186 End stage renal disease: Secondary | ICD-10-CM | POA: Diagnosis not present

## 2015-01-21 DIAGNOSIS — N2581 Secondary hyperparathyroidism of renal origin: Secondary | ICD-10-CM | POA: Diagnosis not present

## 2015-01-24 DIAGNOSIS — E875 Hyperkalemia: Secondary | ICD-10-CM | POA: Diagnosis not present

## 2015-01-24 DIAGNOSIS — D631 Anemia in chronic kidney disease: Secondary | ICD-10-CM | POA: Diagnosis not present

## 2015-01-24 DIAGNOSIS — E1129 Type 2 diabetes mellitus with other diabetic kidney complication: Secondary | ICD-10-CM | POA: Diagnosis not present

## 2015-01-24 DIAGNOSIS — N186 End stage renal disease: Secondary | ICD-10-CM | POA: Diagnosis not present

## 2015-01-24 DIAGNOSIS — N2581 Secondary hyperparathyroidism of renal origin: Secondary | ICD-10-CM | POA: Diagnosis not present

## 2015-01-26 DIAGNOSIS — E1129 Type 2 diabetes mellitus with other diabetic kidney complication: Secondary | ICD-10-CM | POA: Diagnosis not present

## 2015-01-26 DIAGNOSIS — E875 Hyperkalemia: Secondary | ICD-10-CM | POA: Diagnosis not present

## 2015-01-26 DIAGNOSIS — N2581 Secondary hyperparathyroidism of renal origin: Secondary | ICD-10-CM | POA: Diagnosis not present

## 2015-01-26 DIAGNOSIS — N186 End stage renal disease: Secondary | ICD-10-CM | POA: Diagnosis not present

## 2015-01-26 DIAGNOSIS — D631 Anemia in chronic kidney disease: Secondary | ICD-10-CM | POA: Diagnosis not present

## 2015-01-27 ENCOUNTER — Inpatient Hospital Stay (HOSPITAL_COMMUNITY): Admission: RE | Admit: 2015-01-27 | Payer: Medicare Other | Source: Ambulatory Visit

## 2015-01-28 DIAGNOSIS — D631 Anemia in chronic kidney disease: Secondary | ICD-10-CM | POA: Diagnosis not present

## 2015-01-28 DIAGNOSIS — E875 Hyperkalemia: Secondary | ICD-10-CM | POA: Diagnosis not present

## 2015-01-28 DIAGNOSIS — N2581 Secondary hyperparathyroidism of renal origin: Secondary | ICD-10-CM | POA: Diagnosis not present

## 2015-01-28 DIAGNOSIS — N186 End stage renal disease: Secondary | ICD-10-CM | POA: Diagnosis not present

## 2015-01-28 DIAGNOSIS — E1129 Type 2 diabetes mellitus with other diabetic kidney complication: Secondary | ICD-10-CM | POA: Diagnosis not present

## 2015-01-31 DIAGNOSIS — E875 Hyperkalemia: Secondary | ICD-10-CM | POA: Diagnosis not present

## 2015-01-31 DIAGNOSIS — N186 End stage renal disease: Secondary | ICD-10-CM | POA: Diagnosis not present

## 2015-01-31 DIAGNOSIS — E1129 Type 2 diabetes mellitus with other diabetic kidney complication: Secondary | ICD-10-CM | POA: Diagnosis not present

## 2015-01-31 DIAGNOSIS — D631 Anemia in chronic kidney disease: Secondary | ICD-10-CM | POA: Diagnosis not present

## 2015-01-31 DIAGNOSIS — N2581 Secondary hyperparathyroidism of renal origin: Secondary | ICD-10-CM | POA: Diagnosis not present

## 2015-02-01 DIAGNOSIS — L280 Lichen simplex chronicus: Secondary | ICD-10-CM | POA: Diagnosis not present

## 2015-02-02 DIAGNOSIS — E1129 Type 2 diabetes mellitus with other diabetic kidney complication: Secondary | ICD-10-CM | POA: Diagnosis not present

## 2015-02-02 DIAGNOSIS — D631 Anemia in chronic kidney disease: Secondary | ICD-10-CM | POA: Diagnosis not present

## 2015-02-02 DIAGNOSIS — N186 End stage renal disease: Secondary | ICD-10-CM | POA: Diagnosis not present

## 2015-02-02 DIAGNOSIS — N2581 Secondary hyperparathyroidism of renal origin: Secondary | ICD-10-CM | POA: Diagnosis not present

## 2015-02-02 DIAGNOSIS — E875 Hyperkalemia: Secondary | ICD-10-CM | POA: Diagnosis not present

## 2015-02-04 DIAGNOSIS — N186 End stage renal disease: Secondary | ICD-10-CM | POA: Diagnosis not present

## 2015-02-04 DIAGNOSIS — E1129 Type 2 diabetes mellitus with other diabetic kidney complication: Secondary | ICD-10-CM | POA: Diagnosis not present

## 2015-02-04 DIAGNOSIS — D631 Anemia in chronic kidney disease: Secondary | ICD-10-CM | POA: Diagnosis not present

## 2015-02-04 DIAGNOSIS — H10413 Chronic giant papillary conjunctivitis, bilateral: Secondary | ICD-10-CM | POA: Diagnosis not present

## 2015-02-04 DIAGNOSIS — E119 Type 2 diabetes mellitus without complications: Secondary | ICD-10-CM | POA: Diagnosis not present

## 2015-02-04 DIAGNOSIS — N2581 Secondary hyperparathyroidism of renal origin: Secondary | ICD-10-CM | POA: Diagnosis not present

## 2015-02-04 DIAGNOSIS — H25813 Combined forms of age-related cataract, bilateral: Secondary | ICD-10-CM | POA: Diagnosis not present

## 2015-02-04 DIAGNOSIS — E875 Hyperkalemia: Secondary | ICD-10-CM | POA: Diagnosis not present

## 2015-02-05 ENCOUNTER — Emergency Department (HOSPITAL_COMMUNITY)
Admission: EM | Admit: 2015-02-05 | Discharge: 2015-02-05 | Disposition: A | Discharge status: Home / Self Care | Payer: Medicare Other | Attending: Emergency Medicine | Admitting: Emergency Medicine

## 2015-02-05 ENCOUNTER — Encounter (HOSPITAL_COMMUNITY): Payer: Self-pay | Admitting: Emergency Medicine

## 2015-02-05 ENCOUNTER — Emergency Department (HOSPITAL_COMMUNITY): Payer: Medicare Other

## 2015-02-05 DIAGNOSIS — Z79899 Other long term (current) drug therapy: Secondary | ICD-10-CM | POA: Diagnosis not present

## 2015-02-05 DIAGNOSIS — Z791 Long term (current) use of non-steroidal anti-inflammatories (NSAID): Secondary | ICD-10-CM | POA: Diagnosis not present

## 2015-02-05 DIAGNOSIS — D649 Anemia, unspecified: Secondary | ICD-10-CM | POA: Insufficient documentation

## 2015-02-05 DIAGNOSIS — G8929 Other chronic pain: Secondary | ICD-10-CM | POA: Diagnosis not present

## 2015-02-05 DIAGNOSIS — Z992 Dependence on renal dialysis: Secondary | ICD-10-CM | POA: Diagnosis not present

## 2015-02-05 DIAGNOSIS — Z7982 Long term (current) use of aspirin: Secondary | ICD-10-CM | POA: Diagnosis not present

## 2015-02-05 DIAGNOSIS — I12 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 stage renal disease: Secondary | ICD-10-CM | POA: Diagnosis not present

## 2015-02-05 DIAGNOSIS — R51 Headache: Secondary | ICD-10-CM | POA: Diagnosis not present

## 2015-02-05 DIAGNOSIS — N186 End stage renal disease: Secondary | ICD-10-CM | POA: Diagnosis not present

## 2015-02-05 DIAGNOSIS — Z87891 Personal history of nicotine dependence: Secondary | ICD-10-CM | POA: Diagnosis not present

## 2015-02-05 DIAGNOSIS — R110 Nausea: Secondary | ICD-10-CM | POA: Diagnosis not present

## 2015-02-05 DIAGNOSIS — R519 Headache, unspecified: Secondary | ICD-10-CM

## 2015-02-05 DIAGNOSIS — R05 Cough: Secondary | ICD-10-CM | POA: Diagnosis not present

## 2015-02-05 MED ORDER — DIPHENHYDRAMINE HCL 25 MG PO CAPS
25.0000 mg | ORAL_CAPSULE | Freq: Once | ORAL | Status: AC
  Administered 2015-02-05: 25 mg via ORAL
  Filled 2015-02-05: qty 1

## 2015-02-05 MED ORDER — MORPHINE SULFATE (PF) 4 MG/ML IV SOLN
4.0000 mg | Freq: Once | INTRAVENOUS | Status: AC
  Administered 2015-02-05: 4 mg via INTRAVENOUS
  Filled 2015-02-05: qty 1

## 2015-02-05 MED ORDER — METOCLOPRAMIDE HCL 10 MG PO TABS
5.0000 mg | ORAL_TABLET | Freq: Once | ORAL | Status: AC
  Administered 2015-02-05: 5 mg via ORAL
  Filled 2015-02-05: qty 1

## 2015-02-05 MED ORDER — ACETAMINOPHEN 325 MG PO TABS
650.0000 mg | ORAL_TABLET | Freq: Once | ORAL | Status: AC
  Administered 2015-02-05: 650 mg via ORAL
  Filled 2015-02-05: qty 2

## 2015-02-05 MED ORDER — SODIUM CHLORIDE 0.9 % IV BOLUS (SEPSIS)
1000.0000 mL | Freq: Once | INTRAVENOUS | Status: AC
  Administered 2015-02-05: 1000 mL via INTRAVENOUS

## 2015-02-05 MED ORDER — BUTALBITAL-APAP-CAFFEINE 50-325-40 MG PO TABS: 1.0000 | ORAL_TABLET | Freq: Four times a day (QID) | ORAL | Status: DC | PRN

## 2015-02-05 NOTE — ED Notes (Signed)
PA at bedside.

## 2015-02-05 NOTE — ED Provider Notes (Signed)
CSN: [id number]     Arrival date & time 02/05/15  [id number] History   First MD Initiated Contact with Patient 02/05/15 [phone]     Chief Complaint  Patient presents with  . Headache  . Cough  . Nausea   HPI   54 year old male with a history of hypertension and end-stage renal disease on dialysis presents today with complaints of headache, upper respiratory complaints, and nausea. Patient reports a significant past medical history of migraines, has been seen in neurology for this with MRI and CT scans which showed no acute significant findings. He was not placed on any daily medications given medications for abortive therapy. He reports today's headache is similar to previous but has lasted longer than his usual migraines. He reports a left-sided with light sensitivity, nausea, dry heaving, no active vomiting. Patient reports headache is usually attributed to his dialysis, which she received yesterday on his Monday Wednesday Friday schedule. Patient reports symptoms have been present for approximately 2 weeks, he does note some "clouding in his left visual field", but no decreased visual acuity. He was seen by his ophthalmologist yesterday with no significant findings. Patient denies any neurological complaints.   Past Medical History  Diagnosis Date  . Hypertension   . Anemia   . Chronic headaches   . Sleep apnea   . Chronic kidney disease     Chronic Renal Failure; On Renal Transplant List  . Presence of arteriovenous fistula for hemodialysis, primary ([place])     RUE  . Hemodialysis patient [facility])     M-W-F   Past Surgical History  Procedure Laterality Date  . Av fistula placement Right     right arm  . Graft left arm Left     for dialysis x 2  . Insertion of dialysis catheter      Rt chest   Family History  Problem Relation Age of Onset  . Diabetes Father   . Stroke Father   . Hypertension Father   . Uterine cancer Mother   . Lupus Sister   . Stroke Sister   . Anuerysm Brother      brain  . Hypertension Sister    Social History  Substance Use Topics  . Smoking status: Former Smoker    Quit date: 06/19/1990  . Smokeless tobacco: Never Used  . Alcohol Use: 0.6 oz/week    1 Cans of beer per week     Comment: occasional    Review of Systems  All other systems reviewed and are negative.   Allergies  Infed; Oxycodone; Vicodin; Diclofenac; and Whole blood  Home Medications   Prior to Admission medications   Medication Sig Start Date End Date Taking? Authorizing Provider  aspirin EC 81 MG tablet Take 1 tablet (81 mg total) by mouth daily. 08/26/14  Yes Rosalin Hawking, MD  atorvastatin (LIPITOR) 10 MG tablet Take 1 tablet (10 mg total) by mouth daily at 6 PM. 05/25/14  Yes Thurnell Lose, MD  calcium acetate (PHOSLO) 667 MG capsule Take 2,001 mg by mouth 3 (three) times daily with meals.    Yes Historical Provider, MD  cinacalcet (SENSIPAR) 60 MG tablet Take 120 mg by mouth daily.    Yes Historical Provider, MD  Darbepoetin Alfa (ARANESP) 200 MCG/0.4ML SOSY injection Inject 0.4 mLs (200 mcg total) into the vein every Friday with hemodialysis. 07/17/14  Yes Luan Moore, MD  gabapentin (NEURONTIN) 100 MG capsule Take 100 mg by mouth 2 (two) times daily.  09/29/14  Yes  Historical Provider, MD  ibuprofen (ADVIL,MOTRIN) 800 MG tablet Take 800 mg by mouth 2 (two) times daily.   Yes Historical Provider, MD  lanthanum (FOSRENOL) 1000 MG chewable tablet Chew 1,000 mg by mouth 3 (three) times daily with meals.   Yes Historical Provider, MD  Multiple Vitamin (MULTI VITAMIN MENS PO) Take 1 tablet by mouth daily.     Yes Historical Provider, MD  sorbitol 70 % solution Take 15-60 mLs by mouth daily as needed (dialysis days).    Yes Historical Provider, MD  butalbital-acetaminophen-caffeine (FIORICET) 50-325-40 MG tablet Take 1 tablet by mouth every 6 (six) hours as needed for headache. 02/05/15 02/05/16  Dellis Filbert Jansen Goodpasture, PA-C   BP 108/78 mmHg  Pulse 86  Temp(Src) 98.1 F (36.7 C)  (Oral)  Resp 15  SpO2 100%   Physical Exam  Constitutional: He is oriented to person, place, and time. He appears well-developed and well-nourished.  HENT:  Head: Normocephalic and atraumatic.  Mouth/Throat: Uvula is midline and oropharynx is clear and moist. No oropharyngeal exudate.  Impacted external canals bilateral   Eyes: Conjunctivae are normal. Pupils are equal, round, and reactive to light. Right eye exhibits no discharge. Left eye exhibits no discharge. No scleral icterus.  Neck: Normal range of motion. No JVD present. No tracheal deviation present.  Pulmonary/Chest: Effort normal. No stridor.  Neurological: He is alert and oriented to person, place, and time. He has normal strength. No cranial nerve deficit or sensory deficit. Coordination normal. GCS eye subscore is 4. GCS verbal subscore is 5. GCS motor subscore is 6.  Reflex Scores:      Patellar reflexes are 2+ on the right side and 2+ on the left side. Psychiatric: He has a normal mood and affect. His behavior is normal. Judgment and thought content normal.  Nursing note and vitals reviewed.   ED Course  Procedures (including critical care time) Labs Review Labs Reviewed - No data to display  Imaging Review Ct Head Wo Contrast  02/05/2015  CLINICAL DATA:  54 year old male with 2 week history of left parietal headache EXAM: CT HEAD WITHOUT CONTRAST TECHNIQUE: Contiguous axial images were obtained from the base of the skull through the vertex without intravenous contrast. COMPARISON:  Prior brain MRI/ MRA 09/16/2014 FINDINGS: Negative for acute intracranial hemorrhage, acute infarction, mass, mass effect, hydrocephalus or midline shift. Gray-white differentiation is preserved throughout. Stable appearance of prominent dural calcifications along the falx cerebrum and tentorium cerebelli. 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 IMPRESSION: 1. No acute intracranial abnormality. 2. Stable appearance of the head  with prominence dural calcifications. Electronically Signed   By: Jacqulynn Cadet M.D.   On: 02/05/2015 11:25   I have personally reviewed and evaluated these images and lab results as part of my medical decision-making.   EKG Interpretation None      MDM   Final diagnoses:  Acute nonintractable headache, unspecified headache type    Labs:  Imaging:  Consults:  Therapeutics: Morphine, normal saline, Reglan, Benadryl, Tylenol-  Discharge Meds: Fioricet  Assessment/Plan: 54 year old male presents today with headache. Patient has a history of the same, today's headache has persisted longer than usual duration. He's been seen by neurology previously with MRIs and CT scans showing no acute findings. Patient was given pain medication here with no improvement in symptoms, CT head showed no significant acute findings. He was given morphine which seemed to improve his symptoms. Due to patient's mental change in baseline headache with normal headache medications patient will  be given Fioricet for management at home. He will need follow-up with neurology and primary care first thing next week for reevaluation. He is afebrile with full range of motion of his neck close suspicion for meningitic type infection. CT shows no mass affect, or acute lesions. Patient has no neurological deficits. No acute findings of infection in the upper respiratory tract. Patient improved after pain medication here he will be discharged home with strict return precautions, verbalized understanding and agreement for today's plan had no further questions or concerns at the time discharge.         Okey Regal, PA-C 02/05/15 1641  Orlie Dakin, MD 02/05/15 1806

## 2015-02-05 NOTE — Discharge Instructions (Signed)
Please read attached information. If you experience any new or worsening signs or symptoms please return to the emergency room for evaluation. Please follow-up with your primary care provider or specialist as discussed. Please use medication prescribed only as directed and discontinue taking if you have any concerning signs or symptoms.   °

## 2015-02-05 NOTE — ED Provider Notes (Signed)
Complains of left-sided throbbing headache coming by nausea similar to headaches he's had off and on for for several years. This headache started 2 weeks ago. Other symptoms include visual cloudiness" for several days which is improving with time He also presents with cough for the past 2 weeks which is steadily improving with time. He denies any fever. No other associated symptoms. Patient's last hemodialysis session was yesterday he quit 30 minutes early but "made it to his dry weight on exam alert no distress. HEENT exam no facial asymmetry eyes pupils equal round reactive to light extraocular muscles intact. No psychotic conjunctival erythema neck supple trachea midline lungs clear to auscultation heart regular rate and rhythm abdomen nondistended nontender extremities without edema. Left upper extremity with fistula in form with good thrill. Neurologic Glasgow Coma Score 15 cranial nerves II through XII grossly intact gait normal Romberg normal pronator drift normal finger to nose normal  Orlie Dakin, MD 02/05/15 1805

## 2015-02-05 NOTE — ED Notes (Signed)
Pt arrivs from home c/o ongoing headache with cloudy vision, congestion x 2 weeks.  Pt reports getting vision checked out by eye doctor yesterday with no problems identified.  Pt reports productive cough with dry heaves.  Pt reports being up to date with dialysis.  NAD noted at this time. PA at bedside.

## 2015-02-07 DIAGNOSIS — E875 Hyperkalemia: Secondary | ICD-10-CM | POA: Diagnosis not present

## 2015-02-07 DIAGNOSIS — N2581 Secondary hyperparathyroidism of renal origin: Secondary | ICD-10-CM | POA: Diagnosis not present

## 2015-02-07 DIAGNOSIS — D631 Anemia in chronic kidney disease: Secondary | ICD-10-CM | POA: Diagnosis not present

## 2015-02-07 DIAGNOSIS — N186 End stage renal disease: Secondary | ICD-10-CM | POA: Diagnosis not present

## 2015-02-07 DIAGNOSIS — E1129 Type 2 diabetes mellitus with other diabetic kidney complication: Secondary | ICD-10-CM | POA: Diagnosis not present

## 2015-02-08 ENCOUNTER — Telehealth: Payer: Self-pay | Admitting: Neurology

## 2015-02-08 DIAGNOSIS — G4733 Obstructive sleep apnea (adult) (pediatric): Secondary | ICD-10-CM | POA: Insufficient documentation

## 2015-02-08 NOTE — Telephone Encounter (Signed)
Spoke to pt and advised him that Dr. Erlinda Hong is out of the office at this time but when he comes back, I will ask his RN to discuss this with him and see if he can be worked in sooner than March. Pt verbalized understanding and appreciation.

## 2015-02-08 NOTE — Telephone Encounter (Signed)
Patient is calling and states he was just released from the ER 02/05/15 and needs to see Dr. Erlinda Hong for a f/up.  (He is Dr. Phoebe Sharps regular patient). Dr. Erlinda Hong would need to be worked in as there are no times available until March 2017.  Please call.

## 2015-02-09 DIAGNOSIS — N186 End stage renal disease: Secondary | ICD-10-CM | POA: Diagnosis not present

## 2015-02-09 DIAGNOSIS — E875 Hyperkalemia: Secondary | ICD-10-CM | POA: Diagnosis not present

## 2015-02-09 DIAGNOSIS — N2581 Secondary hyperparathyroidism of renal origin: Secondary | ICD-10-CM | POA: Diagnosis not present

## 2015-02-09 DIAGNOSIS — D631 Anemia in chronic kidney disease: Secondary | ICD-10-CM | POA: Diagnosis not present

## 2015-02-09 DIAGNOSIS — E1129 Type 2 diabetes mellitus with other diabetic kidney complication: Secondary | ICD-10-CM | POA: Diagnosis not present

## 2015-02-10 NOTE — Telephone Encounter (Signed)
Rn call patient back and gave him the below message about being seen sooner. Rn gave the below message that Dr. Erlinda Hong sent. Pt verbalized understanding. Pt will call if the headache gets worse for a sooner appt.

## 2015-02-10 NOTE — Telephone Encounter (Signed)
Hi, Katrina:  He was evaluated in ER for migraine headache. CT brain no acute findings. Improved after pain management in ED. Given fioricet for HA and as per report he felt a lot better with the medication. At this point, I do not see he needs to come in earlier than his appointment in March. If his HA continues, or worsening, or other symptoms, we will happy to see him earlier. Please let the pt know. Thanks.  Rosalin Hawking, MD PhD Stroke Neurology 02/10/2015 3:56 PM

## 2015-02-10 NOTE — Telephone Encounter (Signed)
Rn talk to patient about his Ed visit in the last week. Pt stated he feels a lot better with the floricet for his headache. Rn stated per the ED notes he was stable to be discharge from the hospital for the scans he had. Rn stated Dr. Erlinda Hong has been forward the message and will review if he needs to come in sooner.

## 2015-02-11 DIAGNOSIS — D631 Anemia in chronic kidney disease: Secondary | ICD-10-CM | POA: Diagnosis not present

## 2015-02-11 DIAGNOSIS — E1129 Type 2 diabetes mellitus with other diabetic kidney complication: Secondary | ICD-10-CM | POA: Diagnosis not present

## 2015-02-11 DIAGNOSIS — N186 End stage renal disease: Secondary | ICD-10-CM | POA: Diagnosis not present

## 2015-02-11 DIAGNOSIS — E875 Hyperkalemia: Secondary | ICD-10-CM | POA: Diagnosis not present

## 2015-02-11 DIAGNOSIS — N2581 Secondary hyperparathyroidism of renal origin: Secondary | ICD-10-CM | POA: Diagnosis not present

## 2015-02-12 DIAGNOSIS — Z992 Dependence on renal dialysis: Secondary | ICD-10-CM | POA: Diagnosis not present

## 2015-02-12 DIAGNOSIS — I12 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 stage renal disease: Secondary | ICD-10-CM | POA: Diagnosis not present

## 2015-02-12 DIAGNOSIS — N186 End stage renal disease: Secondary | ICD-10-CM | POA: Diagnosis not present

## 2015-02-13 HISTORY — PX: COLONOSCOPY: SHX174

## 2015-02-14 DIAGNOSIS — E875 Hyperkalemia: Secondary | ICD-10-CM | POA: Diagnosis not present

## 2015-02-14 DIAGNOSIS — N186 End stage renal disease: Secondary | ICD-10-CM | POA: Diagnosis not present

## 2015-02-14 DIAGNOSIS — E1129 Type 2 diabetes mellitus with other diabetic kidney complication: Secondary | ICD-10-CM | POA: Diagnosis not present

## 2015-02-14 DIAGNOSIS — N2581 Secondary hyperparathyroidism of renal origin: Secondary | ICD-10-CM | POA: Diagnosis not present

## 2015-02-16 DIAGNOSIS — E1129 Type 2 diabetes mellitus with other diabetic kidney complication: Secondary | ICD-10-CM | POA: Diagnosis not present

## 2015-02-16 DIAGNOSIS — N186 End stage renal disease: Secondary | ICD-10-CM | POA: Diagnosis not present

## 2015-02-16 DIAGNOSIS — N2581 Secondary hyperparathyroidism of renal origin: Secondary | ICD-10-CM | POA: Diagnosis not present

## 2015-02-16 DIAGNOSIS — E875 Hyperkalemia: Secondary | ICD-10-CM | POA: Diagnosis not present

## 2015-02-18 DIAGNOSIS — E1129 Type 2 diabetes mellitus with other diabetic kidney complication: Secondary | ICD-10-CM | POA: Diagnosis not present

## 2015-02-18 DIAGNOSIS — N2581 Secondary hyperparathyroidism of renal origin: Secondary | ICD-10-CM | POA: Diagnosis not present

## 2015-02-18 DIAGNOSIS — E875 Hyperkalemia: Secondary | ICD-10-CM | POA: Diagnosis not present

## 2015-02-18 DIAGNOSIS — N186 End stage renal disease: Secondary | ICD-10-CM | POA: Diagnosis not present

## 2015-02-21 DIAGNOSIS — N2581 Secondary hyperparathyroidism of renal origin: Secondary | ICD-10-CM | POA: Diagnosis not present

## 2015-02-21 DIAGNOSIS — N186 End stage renal disease: Secondary | ICD-10-CM | POA: Diagnosis not present

## 2015-02-21 DIAGNOSIS — E875 Hyperkalemia: Secondary | ICD-10-CM | POA: Diagnosis not present

## 2015-02-21 DIAGNOSIS — E1129 Type 2 diabetes mellitus with other diabetic kidney complication: Secondary | ICD-10-CM | POA: Diagnosis not present

## 2015-02-23 DIAGNOSIS — N2581 Secondary hyperparathyroidism of renal origin: Secondary | ICD-10-CM | POA: Diagnosis not present

## 2015-02-23 DIAGNOSIS — N186 End stage renal disease: Secondary | ICD-10-CM | POA: Diagnosis not present

## 2015-02-23 DIAGNOSIS — E875 Hyperkalemia: Secondary | ICD-10-CM | POA: Diagnosis not present

## 2015-02-23 DIAGNOSIS — E1129 Type 2 diabetes mellitus with other diabetic kidney complication: Secondary | ICD-10-CM | POA: Diagnosis not present

## 2015-02-25 DIAGNOSIS — E1129 Type 2 diabetes mellitus with other diabetic kidney complication: Secondary | ICD-10-CM | POA: Diagnosis not present

## 2015-02-25 DIAGNOSIS — N186 End stage renal disease: Secondary | ICD-10-CM | POA: Diagnosis not present

## 2015-02-25 DIAGNOSIS — N2581 Secondary hyperparathyroidism of renal origin: Secondary | ICD-10-CM | POA: Diagnosis not present

## 2015-02-25 DIAGNOSIS — E875 Hyperkalemia: Secondary | ICD-10-CM | POA: Diagnosis not present

## 2015-02-28 DIAGNOSIS — E1129 Type 2 diabetes mellitus with other diabetic kidney complication: Secondary | ICD-10-CM | POA: Diagnosis not present

## 2015-02-28 DIAGNOSIS — N2581 Secondary hyperparathyroidism of renal origin: Secondary | ICD-10-CM | POA: Diagnosis not present

## 2015-02-28 DIAGNOSIS — E875 Hyperkalemia: Secondary | ICD-10-CM | POA: Diagnosis not present

## 2015-02-28 DIAGNOSIS — N186 End stage renal disease: Secondary | ICD-10-CM | POA: Diagnosis not present

## 2015-03-02 DIAGNOSIS — N2581 Secondary hyperparathyroidism of renal origin: Secondary | ICD-10-CM | POA: Diagnosis not present

## 2015-03-02 DIAGNOSIS — E1129 Type 2 diabetes mellitus with other diabetic kidney complication: Secondary | ICD-10-CM | POA: Diagnosis not present

## 2015-03-02 DIAGNOSIS — N186 End stage renal disease: Secondary | ICD-10-CM | POA: Diagnosis not present

## 2015-03-02 DIAGNOSIS — E875 Hyperkalemia: Secondary | ICD-10-CM | POA: Diagnosis not present

## 2015-03-04 DIAGNOSIS — N2581 Secondary hyperparathyroidism of renal origin: Secondary | ICD-10-CM | POA: Diagnosis not present

## 2015-03-04 DIAGNOSIS — E1129 Type 2 diabetes mellitus with other diabetic kidney complication: Secondary | ICD-10-CM | POA: Diagnosis not present

## 2015-03-04 DIAGNOSIS — E875 Hyperkalemia: Secondary | ICD-10-CM | POA: Diagnosis not present

## 2015-03-04 DIAGNOSIS — N186 End stage renal disease: Secondary | ICD-10-CM | POA: Diagnosis not present

## 2015-03-07 DIAGNOSIS — E875 Hyperkalemia: Secondary | ICD-10-CM | POA: Diagnosis not present

## 2015-03-07 DIAGNOSIS — E1129 Type 2 diabetes mellitus with other diabetic kidney complication: Secondary | ICD-10-CM | POA: Diagnosis not present

## 2015-03-07 DIAGNOSIS — N186 End stage renal disease: Secondary | ICD-10-CM | POA: Diagnosis not present

## 2015-03-07 DIAGNOSIS — N2581 Secondary hyperparathyroidism of renal origin: Secondary | ICD-10-CM | POA: Diagnosis not present

## 2015-03-09 DIAGNOSIS — E875 Hyperkalemia: Secondary | ICD-10-CM | POA: Diagnosis not present

## 2015-03-09 DIAGNOSIS — E1129 Type 2 diabetes mellitus with other diabetic kidney complication: Secondary | ICD-10-CM | POA: Diagnosis not present

## 2015-03-09 DIAGNOSIS — N2581 Secondary hyperparathyroidism of renal origin: Secondary | ICD-10-CM | POA: Diagnosis not present

## 2015-03-09 DIAGNOSIS — N186 End stage renal disease: Secondary | ICD-10-CM | POA: Diagnosis not present

## 2015-03-11 DIAGNOSIS — N186 End stage renal disease: Secondary | ICD-10-CM | POA: Diagnosis not present

## 2015-03-11 DIAGNOSIS — E1129 Type 2 diabetes mellitus with other diabetic kidney complication: Secondary | ICD-10-CM | POA: Diagnosis not present

## 2015-03-11 DIAGNOSIS — N2581 Secondary hyperparathyroidism of renal origin: Secondary | ICD-10-CM | POA: Diagnosis not present

## 2015-03-11 DIAGNOSIS — E875 Hyperkalemia: Secondary | ICD-10-CM | POA: Diagnosis not present

## 2015-03-14 DIAGNOSIS — E875 Hyperkalemia: Secondary | ICD-10-CM | POA: Diagnosis not present

## 2015-03-14 DIAGNOSIS — E1129 Type 2 diabetes mellitus with other diabetic kidney complication: Secondary | ICD-10-CM | POA: Diagnosis not present

## 2015-03-14 DIAGNOSIS — N186 End stage renal disease: Secondary | ICD-10-CM | POA: Diagnosis not present

## 2015-03-14 DIAGNOSIS — N2581 Secondary hyperparathyroidism of renal origin: Secondary | ICD-10-CM | POA: Diagnosis not present

## 2015-03-15 DIAGNOSIS — I12 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 stage renal disease: Secondary | ICD-10-CM | POA: Diagnosis not present

## 2015-03-15 DIAGNOSIS — N186 End stage renal disease: Secondary | ICD-10-CM | POA: Diagnosis not present

## 2015-03-15 DIAGNOSIS — Z992 Dependence on renal dialysis: Secondary | ICD-10-CM | POA: Diagnosis not present

## 2015-03-16 DIAGNOSIS — E1129 Type 2 diabetes mellitus with other diabetic kidney complication: Secondary | ICD-10-CM | POA: Diagnosis not present

## 2015-03-16 DIAGNOSIS — N2581 Secondary hyperparathyroidism of renal origin: Secondary | ICD-10-CM | POA: Diagnosis not present

## 2015-03-16 DIAGNOSIS — Z0000 Encounter for general adult medical examination without abnormal findings: Secondary | ICD-10-CM | POA: Diagnosis not present

## 2015-03-16 DIAGNOSIS — E875 Hyperkalemia: Secondary | ICD-10-CM | POA: Diagnosis not present

## 2015-03-16 DIAGNOSIS — D631 Anemia in chronic kidney disease: Secondary | ICD-10-CM | POA: Diagnosis not present

## 2015-03-16 DIAGNOSIS — N186 End stage renal disease: Secondary | ICD-10-CM | POA: Diagnosis not present

## 2015-03-18 DIAGNOSIS — N186 End stage renal disease: Secondary | ICD-10-CM | POA: Diagnosis not present

## 2015-03-18 DIAGNOSIS — D631 Anemia in chronic kidney disease: Secondary | ICD-10-CM | POA: Diagnosis not present

## 2015-03-18 DIAGNOSIS — E1129 Type 2 diabetes mellitus with other diabetic kidney complication: Secondary | ICD-10-CM | POA: Diagnosis not present

## 2015-03-18 DIAGNOSIS — E875 Hyperkalemia: Secondary | ICD-10-CM | POA: Diagnosis not present

## 2015-03-18 DIAGNOSIS — N2581 Secondary hyperparathyroidism of renal origin: Secondary | ICD-10-CM | POA: Diagnosis not present

## 2015-03-21 DIAGNOSIS — E875 Hyperkalemia: Secondary | ICD-10-CM | POA: Diagnosis not present

## 2015-03-21 DIAGNOSIS — E1129 Type 2 diabetes mellitus with other diabetic kidney complication: Secondary | ICD-10-CM | POA: Diagnosis not present

## 2015-03-21 DIAGNOSIS — D631 Anemia in chronic kidney disease: Secondary | ICD-10-CM | POA: Diagnosis not present

## 2015-03-21 DIAGNOSIS — N2581 Secondary hyperparathyroidism of renal origin: Secondary | ICD-10-CM | POA: Diagnosis not present

## 2015-03-21 DIAGNOSIS — N186 End stage renal disease: Secondary | ICD-10-CM | POA: Diagnosis not present

## 2015-03-23 DIAGNOSIS — E1129 Type 2 diabetes mellitus with other diabetic kidney complication: Secondary | ICD-10-CM | POA: Diagnosis not present

## 2015-03-23 DIAGNOSIS — E875 Hyperkalemia: Secondary | ICD-10-CM | POA: Diagnosis not present

## 2015-03-23 DIAGNOSIS — D631 Anemia in chronic kidney disease: Secondary | ICD-10-CM | POA: Diagnosis not present

## 2015-03-23 DIAGNOSIS — N2581 Secondary hyperparathyroidism of renal origin: Secondary | ICD-10-CM | POA: Diagnosis not present

## 2015-03-23 DIAGNOSIS — N186 End stage renal disease: Secondary | ICD-10-CM | POA: Diagnosis not present

## 2015-03-25 DIAGNOSIS — N2581 Secondary hyperparathyroidism of renal origin: Secondary | ICD-10-CM | POA: Diagnosis not present

## 2015-03-25 DIAGNOSIS — N186 End stage renal disease: Secondary | ICD-10-CM | POA: Diagnosis not present

## 2015-03-25 DIAGNOSIS — E1129 Type 2 diabetes mellitus with other diabetic kidney complication: Secondary | ICD-10-CM | POA: Diagnosis not present

## 2015-03-25 DIAGNOSIS — E875 Hyperkalemia: Secondary | ICD-10-CM | POA: Diagnosis not present

## 2015-03-25 DIAGNOSIS — D631 Anemia in chronic kidney disease: Secondary | ICD-10-CM | POA: Diagnosis not present

## 2015-03-28 DIAGNOSIS — E1129 Type 2 diabetes mellitus with other diabetic kidney complication: Secondary | ICD-10-CM | POA: Diagnosis not present

## 2015-03-28 DIAGNOSIS — D631 Anemia in chronic kidney disease: Secondary | ICD-10-CM | POA: Diagnosis not present

## 2015-03-28 DIAGNOSIS — N186 End stage renal disease: Secondary | ICD-10-CM | POA: Diagnosis not present

## 2015-03-28 DIAGNOSIS — N2581 Secondary hyperparathyroidism of renal origin: Secondary | ICD-10-CM | POA: Diagnosis not present

## 2015-03-28 DIAGNOSIS — E875 Hyperkalemia: Secondary | ICD-10-CM | POA: Diagnosis not present

## 2015-03-30 DIAGNOSIS — E875 Hyperkalemia: Secondary | ICD-10-CM | POA: Diagnosis not present

## 2015-03-30 DIAGNOSIS — N2581 Secondary hyperparathyroidism of renal origin: Secondary | ICD-10-CM | POA: Diagnosis not present

## 2015-03-30 DIAGNOSIS — N186 End stage renal disease: Secondary | ICD-10-CM | POA: Diagnosis not present

## 2015-03-30 DIAGNOSIS — E1129 Type 2 diabetes mellitus with other diabetic kidney complication: Secondary | ICD-10-CM | POA: Diagnosis not present

## 2015-03-30 DIAGNOSIS — D631 Anemia in chronic kidney disease: Secondary | ICD-10-CM | POA: Diagnosis not present

## 2015-04-01 DIAGNOSIS — E1129 Type 2 diabetes mellitus with other diabetic kidney complication: Secondary | ICD-10-CM | POA: Diagnosis not present

## 2015-04-01 DIAGNOSIS — E875 Hyperkalemia: Secondary | ICD-10-CM | POA: Diagnosis not present

## 2015-04-01 DIAGNOSIS — N186 End stage renal disease: Secondary | ICD-10-CM | POA: Diagnosis not present

## 2015-04-01 DIAGNOSIS — D631 Anemia in chronic kidney disease: Secondary | ICD-10-CM | POA: Diagnosis not present

## 2015-04-01 DIAGNOSIS — N2581 Secondary hyperparathyroidism of renal origin: Secondary | ICD-10-CM | POA: Diagnosis not present

## 2015-04-04 DIAGNOSIS — E875 Hyperkalemia: Secondary | ICD-10-CM | POA: Diagnosis not present

## 2015-04-04 DIAGNOSIS — N2581 Secondary hyperparathyroidism of renal origin: Secondary | ICD-10-CM | POA: Diagnosis not present

## 2015-04-04 DIAGNOSIS — D631 Anemia in chronic kidney disease: Secondary | ICD-10-CM | POA: Diagnosis not present

## 2015-04-04 DIAGNOSIS — E1129 Type 2 diabetes mellitus with other diabetic kidney complication: Secondary | ICD-10-CM | POA: Diagnosis not present

## 2015-04-04 DIAGNOSIS — N186 End stage renal disease: Secondary | ICD-10-CM | POA: Diagnosis not present

## 2015-04-06 DIAGNOSIS — E1129 Type 2 diabetes mellitus with other diabetic kidney complication: Secondary | ICD-10-CM | POA: Diagnosis not present

## 2015-04-06 DIAGNOSIS — N2581 Secondary hyperparathyroidism of renal origin: Secondary | ICD-10-CM | POA: Diagnosis not present

## 2015-04-06 DIAGNOSIS — E875 Hyperkalemia: Secondary | ICD-10-CM | POA: Diagnosis not present

## 2015-04-06 DIAGNOSIS — D631 Anemia in chronic kidney disease: Secondary | ICD-10-CM | POA: Diagnosis not present

## 2015-04-06 DIAGNOSIS — N186 End stage renal disease: Secondary | ICD-10-CM | POA: Diagnosis not present

## 2015-04-08 DIAGNOSIS — N2581 Secondary hyperparathyroidism of renal origin: Secondary | ICD-10-CM | POA: Diagnosis not present

## 2015-04-08 DIAGNOSIS — N186 End stage renal disease: Secondary | ICD-10-CM | POA: Diagnosis not present

## 2015-04-08 DIAGNOSIS — E875 Hyperkalemia: Secondary | ICD-10-CM | POA: Diagnosis not present

## 2015-04-08 DIAGNOSIS — D631 Anemia in chronic kidney disease: Secondary | ICD-10-CM | POA: Diagnosis not present

## 2015-04-08 DIAGNOSIS — E1129 Type 2 diabetes mellitus with other diabetic kidney complication: Secondary | ICD-10-CM | POA: Diagnosis not present

## 2015-04-11 DIAGNOSIS — E1129 Type 2 diabetes mellitus with other diabetic kidney complication: Secondary | ICD-10-CM | POA: Diagnosis not present

## 2015-04-11 DIAGNOSIS — N2581 Secondary hyperparathyroidism of renal origin: Secondary | ICD-10-CM | POA: Diagnosis not present

## 2015-04-11 DIAGNOSIS — E875 Hyperkalemia: Secondary | ICD-10-CM | POA: Diagnosis not present

## 2015-04-11 DIAGNOSIS — N186 End stage renal disease: Secondary | ICD-10-CM | POA: Diagnosis not present

## 2015-04-11 DIAGNOSIS — D631 Anemia in chronic kidney disease: Secondary | ICD-10-CM | POA: Diagnosis not present

## 2015-04-12 DIAGNOSIS — I12 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 stage renal disease: Secondary | ICD-10-CM | POA: Diagnosis not present

## 2015-04-12 DIAGNOSIS — N186 End stage renal disease: Secondary | ICD-10-CM | POA: Diagnosis not present

## 2015-04-12 DIAGNOSIS — Z992 Dependence on renal dialysis: Secondary | ICD-10-CM | POA: Diagnosis not present

## 2015-04-13 DIAGNOSIS — N2581 Secondary hyperparathyroidism of renal origin: Secondary | ICD-10-CM | POA: Diagnosis not present

## 2015-04-13 DIAGNOSIS — E1129 Type 2 diabetes mellitus with other diabetic kidney complication: Secondary | ICD-10-CM | POA: Diagnosis not present

## 2015-04-13 DIAGNOSIS — N186 End stage renal disease: Secondary | ICD-10-CM | POA: Diagnosis not present

## 2015-04-13 DIAGNOSIS — D509 Iron deficiency anemia, unspecified: Secondary | ICD-10-CM | POA: Diagnosis not present

## 2015-04-13 DIAGNOSIS — D631 Anemia in chronic kidney disease: Secondary | ICD-10-CM | POA: Diagnosis not present

## 2015-04-13 DIAGNOSIS — E875 Hyperkalemia: Secondary | ICD-10-CM | POA: Diagnosis not present

## 2015-04-15 DIAGNOSIS — D631 Anemia in chronic kidney disease: Secondary | ICD-10-CM | POA: Diagnosis not present

## 2015-04-15 DIAGNOSIS — E875 Hyperkalemia: Secondary | ICD-10-CM | POA: Diagnosis not present

## 2015-04-15 DIAGNOSIS — E1129 Type 2 diabetes mellitus with other diabetic kidney complication: Secondary | ICD-10-CM | POA: Diagnosis not present

## 2015-04-15 DIAGNOSIS — D509 Iron deficiency anemia, unspecified: Secondary | ICD-10-CM | POA: Diagnosis not present

## 2015-04-15 DIAGNOSIS — N2581 Secondary hyperparathyroidism of renal origin: Secondary | ICD-10-CM | POA: Diagnosis not present

## 2015-04-15 DIAGNOSIS — N186 End stage renal disease: Secondary | ICD-10-CM | POA: Diagnosis not present

## 2015-04-18 DIAGNOSIS — N186 End stage renal disease: Secondary | ICD-10-CM | POA: Diagnosis not present

## 2015-04-18 DIAGNOSIS — D509 Iron deficiency anemia, unspecified: Secondary | ICD-10-CM | POA: Diagnosis not present

## 2015-04-18 DIAGNOSIS — D631 Anemia in chronic kidney disease: Secondary | ICD-10-CM | POA: Diagnosis not present

## 2015-04-18 DIAGNOSIS — E1129 Type 2 diabetes mellitus with other diabetic kidney complication: Secondary | ICD-10-CM | POA: Diagnosis not present

## 2015-04-18 DIAGNOSIS — E875 Hyperkalemia: Secondary | ICD-10-CM | POA: Diagnosis not present

## 2015-04-18 DIAGNOSIS — N2581 Secondary hyperparathyroidism of renal origin: Secondary | ICD-10-CM | POA: Diagnosis not present

## 2015-04-20 DIAGNOSIS — E875 Hyperkalemia: Secondary | ICD-10-CM | POA: Diagnosis not present

## 2015-04-20 DIAGNOSIS — N186 End stage renal disease: Secondary | ICD-10-CM | POA: Diagnosis not present

## 2015-04-20 DIAGNOSIS — D631 Anemia in chronic kidney disease: Secondary | ICD-10-CM | POA: Diagnosis not present

## 2015-04-20 DIAGNOSIS — E1129 Type 2 diabetes mellitus with other diabetic kidney complication: Secondary | ICD-10-CM | POA: Diagnosis not present

## 2015-04-20 DIAGNOSIS — N2581 Secondary hyperparathyroidism of renal origin: Secondary | ICD-10-CM | POA: Diagnosis not present

## 2015-04-20 DIAGNOSIS — D509 Iron deficiency anemia, unspecified: Secondary | ICD-10-CM | POA: Diagnosis not present

## 2015-04-22 DIAGNOSIS — E1129 Type 2 diabetes mellitus with other diabetic kidney complication: Secondary | ICD-10-CM | POA: Diagnosis not present

## 2015-04-22 DIAGNOSIS — N186 End stage renal disease: Secondary | ICD-10-CM | POA: Diagnosis not present

## 2015-04-22 DIAGNOSIS — D631 Anemia in chronic kidney disease: Secondary | ICD-10-CM | POA: Diagnosis not present

## 2015-04-22 DIAGNOSIS — N2581 Secondary hyperparathyroidism of renal origin: Secondary | ICD-10-CM | POA: Diagnosis not present

## 2015-04-22 DIAGNOSIS — D509 Iron deficiency anemia, unspecified: Secondary | ICD-10-CM | POA: Diagnosis not present

## 2015-04-22 DIAGNOSIS — E875 Hyperkalemia: Secondary | ICD-10-CM | POA: Diagnosis not present

## 2015-04-25 DIAGNOSIS — E1129 Type 2 diabetes mellitus with other diabetic kidney complication: Secondary | ICD-10-CM | POA: Diagnosis not present

## 2015-04-25 DIAGNOSIS — D509 Iron deficiency anemia, unspecified: Secondary | ICD-10-CM | POA: Diagnosis not present

## 2015-04-25 DIAGNOSIS — D631 Anemia in chronic kidney disease: Secondary | ICD-10-CM | POA: Diagnosis not present

## 2015-04-25 DIAGNOSIS — N2581 Secondary hyperparathyroidism of renal origin: Secondary | ICD-10-CM | POA: Diagnosis not present

## 2015-04-25 DIAGNOSIS — N186 End stage renal disease: Secondary | ICD-10-CM | POA: Diagnosis not present

## 2015-04-25 DIAGNOSIS — E875 Hyperkalemia: Secondary | ICD-10-CM | POA: Diagnosis not present

## 2015-04-27 ENCOUNTER — Encounter: Payer: Self-pay | Admitting: Podiatry

## 2015-04-27 ENCOUNTER — Ambulatory Visit (INDEPENDENT_AMBULATORY_CARE_PROVIDER_SITE_OTHER): Payer: Medicare Other

## 2015-04-27 ENCOUNTER — Ambulatory Visit (INDEPENDENT_AMBULATORY_CARE_PROVIDER_SITE_OTHER): Payer: Medicare Other | Admitting: Podiatry

## 2015-04-27 VITALS — BP 123/71 | HR 69 | Resp 12

## 2015-04-27 DIAGNOSIS — E875 Hyperkalemia: Secondary | ICD-10-CM | POA: Diagnosis not present

## 2015-04-27 DIAGNOSIS — R52 Pain, unspecified: Secondary | ICD-10-CM

## 2015-04-27 DIAGNOSIS — D631 Anemia in chronic kidney disease: Secondary | ICD-10-CM | POA: Diagnosis not present

## 2015-04-27 DIAGNOSIS — E1129 Type 2 diabetes mellitus with other diabetic kidney complication: Secondary | ICD-10-CM | POA: Diagnosis not present

## 2015-04-27 DIAGNOSIS — D509 Iron deficiency anemia, unspecified: Secondary | ICD-10-CM | POA: Diagnosis not present

## 2015-04-27 DIAGNOSIS — R0989 Other specified symptoms and signs involving the circulatory and respiratory systems: Secondary | ICD-10-CM

## 2015-04-27 DIAGNOSIS — M2042 Other hammer toe(s) (acquired), left foot: Secondary | ICD-10-CM

## 2015-04-27 DIAGNOSIS — N186 End stage renal disease: Secondary | ICD-10-CM | POA: Diagnosis not present

## 2015-04-27 DIAGNOSIS — N2581 Secondary hyperparathyroidism of renal origin: Secondary | ICD-10-CM | POA: Diagnosis not present

## 2015-04-27 NOTE — Patient Instructions (Addendum)
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 The foot pulses in your feet were difficult to feel and you are considering a possible amputation of the second left toe to relieve the painful symptoms. We will order a lower extremity arterial Doppler to evaluate his circulation and notify you results. If surgery is planned I'm going to refer you to Dr. Mayo Ao  Mardelle Matte Hammer toes is a condition in which one or more of your toes is permanently flexed. CAUSES  This happens when a muscle imbalance or abnormal bone length makes your small toes buckle. This causes the toe joint to contract and the strong cord-like bands that attach muscles to the bones (tendons) in your toes to shorten.  SIGNS AND SYMPTOMS  Common symptoms of flexible hammer toes include:   A buildup of skin cells (corns). Corns occur where boney bumps come in frequent contact with hard surfaces. For example, where your shoes press and rub.  Irritation.  Inflammation.  Pain.  Limited motion in your toes. DIAGNOSIS  Hammer toes are diagnosed through a physical exam of your toes. During the exam, your health care provider may try to reproduce your symptoms by manipulating your foot. Often, X-ray exams are done to determine the degree of deformity and to make sure that the cause is not a fracture.  TREATMENT  Hammer toes can be treated with corrective surgery. There are several types of surgical procedures that can treat hammer toes. The most common procedures include:  Arthroplasty--A portion of the joint is surgically removed and your toe is straightened. The gap fills in with fibrous tissue. This procedure helps treat pain and deformity and helps restore function.  Fusion--Cartilage between the two bones of the affected joint is taken out and the bones fuse together into one longer bone. This helps keep your toe stable and reduces pain but leaves your toe stiff, yet  straight.  Implantation--A portion of your bone is removed and replaced with an implant to restore motion.  Flexor tendon transfers--This procedure repositions the tendons that curl the toes down (flexor tendons). This may be done to release the deforming force that causes your toe to buckle. Several of these procedures require fixing your toe with a pin that is visible at the tip of your toe. The pin keeps the toe straight during healing. Your health care provider will remove the pin usually within 4-8 weeks after the procedure.    This information is not intended to replace advice given to you by your health care provider. Make sure you discuss any questions you have with your health care provider.   Document Released: 01/27/2000 Document Revised: 02/03/2013 Document Reviewed: 10/06/2012 Elsevier Interactive Patient Education [employer].

## 2015-04-27 NOTE — Progress Notes (Signed)
   Subjective:    Patient ID: Carl Rockman Sr., male    DOB: [birth year], 55 y.o.   MRN: [id number]  HPI   This patient presents today complaining of the. Painful second left toe with gradual increasing symptoms over the past 5 years. Patient wears very soft athletic style shoes with soft uppers as well as a toe prop which provides some relief of the painful second left toe. Patient states that because of this discomfort in the second left toe he limits the amount of standing and walking.  Patient has a history of stroke, end-stage renal disease and is currently on dialysis He denies any history of diabetes    Review of Systems  Eyes: Positive for redness.  Neurological: Positive for headaches.       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 PIPJ second left  X-ray weightbearing left foot dated 04/29/2015  Intact bony structure without fracture and/or dislocation HAV left Metatarsus adductus Pes planus Hammertoe 2-5 Bone density appears adequate  Radiographic impression: No acute bony abnormality noted left foot dated 04/29/2015          Assessment & Plan:    Assessment: Decrease pedal pulses that needs further evaluation 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 An  additional toe prop was dispensed to patient today  Patient also made aware that is pedal pulses in the feet were somewhat diminished and I recommend a lower extremity arterial Doppler if we are considering possible surgical induration. Patient is referred to the vascular lab for lower extremity bilateral arterial Doppler with ABIs and TBI's for the indication of decreased pedal pulses, possible preop Patient said he would consider the option of possible amputation  If patient is possible candidate for surgical intervention I told patient I would refer patient to Dr. Mayo Ao in our practice to further evaluate him for possible surgical treatment  Notify patient upon receipt of arterial Doppler

## 2015-04-28 ENCOUNTER — Telehealth: Payer: Self-pay | Admitting: *Deleted

## 2015-04-28 ENCOUNTER — Other Ambulatory Visit: Payer: Self-pay | Admitting: Podiatry

## 2015-04-28 DIAGNOSIS — R0989 Other specified symptoms and signs involving the circulatory and respiratory systems: Secondary | ICD-10-CM

## 2015-04-28 DIAGNOSIS — M2042 Other hammer toe(s) (acquired), left foot: Secondary | ICD-10-CM

## 2015-04-28 NOTE — Telephone Encounter (Signed)
Faxed Dr. Phoebe Perch doppler orders to [facility].

## 2015-04-29 DIAGNOSIS — N186 End stage renal disease: Secondary | ICD-10-CM | POA: Diagnosis not present

## 2015-04-29 DIAGNOSIS — E875 Hyperkalemia: Secondary | ICD-10-CM | POA: Diagnosis not present

## 2015-04-29 DIAGNOSIS — N2581 Secondary hyperparathyroidism of renal origin: Secondary | ICD-10-CM | POA: Diagnosis not present

## 2015-04-29 DIAGNOSIS — D631 Anemia in chronic kidney disease: Secondary | ICD-10-CM | POA: Diagnosis not present

## 2015-04-29 DIAGNOSIS — E1129 Type 2 diabetes mellitus with other diabetic kidney complication: Secondary | ICD-10-CM | POA: Diagnosis not present

## 2015-04-29 DIAGNOSIS — D509 Iron deficiency anemia, unspecified: Secondary | ICD-10-CM | POA: Diagnosis not present

## 2015-05-02 DIAGNOSIS — E875 Hyperkalemia: Secondary | ICD-10-CM | POA: Diagnosis not present

## 2015-05-02 DIAGNOSIS — E1129 Type 2 diabetes mellitus with other diabetic kidney complication: Secondary | ICD-10-CM | POA: Diagnosis not present

## 2015-05-02 DIAGNOSIS — N2581 Secondary hyperparathyroidism of renal origin: Secondary | ICD-10-CM | POA: Diagnosis not present

## 2015-05-02 DIAGNOSIS — N186 End stage renal disease: Secondary | ICD-10-CM | POA: Diagnosis not present

## 2015-05-02 DIAGNOSIS — D509 Iron deficiency anemia, unspecified: Secondary | ICD-10-CM | POA: Diagnosis not present

## 2015-05-02 DIAGNOSIS — D631 Anemia in chronic kidney disease: Secondary | ICD-10-CM | POA: Diagnosis not present

## 2015-05-03 ENCOUNTER — Ambulatory Visit: Payer: Medicare Other | Admitting: Neurology

## 2015-05-04 ENCOUNTER — Encounter: Payer: Self-pay | Admitting: Neurology

## 2015-05-04 DIAGNOSIS — N186 End stage renal disease: Secondary | ICD-10-CM | POA: Diagnosis not present

## 2015-05-04 DIAGNOSIS — N2581 Secondary hyperparathyroidism of renal origin: Secondary | ICD-10-CM | POA: Diagnosis not present

## 2015-05-04 DIAGNOSIS — D509 Iron deficiency anemia, unspecified: Secondary | ICD-10-CM | POA: Diagnosis not present

## 2015-05-04 DIAGNOSIS — D631 Anemia in chronic kidney disease: Secondary | ICD-10-CM | POA: Diagnosis not present

## 2015-05-04 DIAGNOSIS — E1129 Type 2 diabetes mellitus with other diabetic kidney complication: Secondary | ICD-10-CM | POA: Diagnosis not present

## 2015-05-04 DIAGNOSIS — E875 Hyperkalemia: Secondary | ICD-10-CM | POA: Diagnosis not present

## 2015-05-05 ENCOUNTER — Ambulatory Visit (HOSPITAL_COMMUNITY)
Admission: RE | Admit: 2015-05-05 | Discharge: 2015-05-05 | Disposition: A | Discharge status: Home / Self Care | Payer: Medicare Other | Source: Ambulatory Visit | Attending: Cardiology | Admitting: Cardiology

## 2015-05-05 ENCOUNTER — Encounter: Payer: Self-pay | Admitting: Neurology

## 2015-05-05 ENCOUNTER — Ambulatory Visit (INDEPENDENT_AMBULATORY_CARE_PROVIDER_SITE_OTHER): Payer: Medicare Other | Admitting: Neurology

## 2015-05-05 VITALS — BP 107/69 | HR 83 | Ht 73.0 in | Wt 217.0 lb

## 2015-05-05 DIAGNOSIS — G031 Chronic meningitis: Secondary | ICD-10-CM

## 2015-05-05 DIAGNOSIS — Z87891 Personal history of nicotine dependence: Secondary | ICD-10-CM | POA: Diagnosis not present

## 2015-05-05 DIAGNOSIS — I63312 Cerebral infarction due to thrombosis of left middle cerebral artery: Secondary | ICD-10-CM

## 2015-05-05 DIAGNOSIS — I6783 Posterior reversible encephalopathy syndrome: Secondary | ICD-10-CM

## 2015-05-05 DIAGNOSIS — I10 Essential (primary) hypertension: Secondary | ICD-10-CM | POA: Insufficient documentation

## 2015-05-05 DIAGNOSIS — E785 Hyperlipidemia, unspecified: Secondary | ICD-10-CM | POA: Insufficient documentation

## 2015-05-05 DIAGNOSIS — R519 Headache, unspecified: Secondary | ICD-10-CM | POA: Insufficient documentation

## 2015-05-05 DIAGNOSIS — G4489 Other headache syndrome: Secondary | ICD-10-CM

## 2015-05-05 DIAGNOSIS — M2042 Other hammer toe(s) (acquired), left foot: Secondary | ICD-10-CM | POA: Diagnosis not present

## 2015-05-05 DIAGNOSIS — R51 Headache: Secondary | ICD-10-CM

## 2015-05-05 DIAGNOSIS — Z8673 Personal history of transient ischemic attack (TIA), and cerebral infarction without residual deficits: Secondary | ICD-10-CM | POA: Insufficient documentation

## 2015-05-05 DIAGNOSIS — G039 Meningitis, unspecified: Secondary | ICD-10-CM | POA: Diagnosis not present

## 2015-05-05 DIAGNOSIS — R0989 Other specified symptoms and signs involving the circulatory and respiratory systems: Secondary | ICD-10-CM | POA: Insufficient documentation

## 2015-05-05 NOTE — Patient Instructions (Signed)
-   continue ASA and lipitor for stroke prevention - will repeat MRI brain and MRV for monitoring - Follow up with your primary care physician for stroke risk factor modification. Recommend maintain blood pressure goal <130/80, diabetes with hemoglobin A1c goal below 6.5% and lipids with LDL cholesterol goal below 70 mg/dL.  - use fioricet as early as possible for migraine episode. Recommend no more than 2 pills a day and no more than 5 pills a week.  - follow up with nephrology for ESRD on HD - follow up with cardiology and GI regularly - check BP at home - follow up in 6 months.

## 2015-05-05 NOTE — Progress Notes (Signed)
STROKE NEUROLOGY FOLLOW UP NOTE  NAME: Carl Annen Sr. DOB: [birth year]  REASON FOR VISIT: stroke follow up HISTORY FROM: pt and chart  Today we had the pleasure of seeing Carl Zahorik Sr. in follow-up at our Neurology Clinic. Pt was accompanied by no one.   History Summary Mr. Carl Null Sr. is a 55 y.o. male with history of HTN, ESRD on HD was admitted for acute onset of dizziness on 05/22/14. He was helping his brother for car repair and struck his head at the top to the hood. A couple of hours later, he felt very dizzy and not right. 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 Also, right occipital pole has tiny DWI changes which may related to PRES. He was admitted for further evaluation. Stroke work up including MRA, TTE and CUS unremarkable. LDL 91. During hospitalization, his dizziness resolved and he was discharged with ASA 325 and lipitor.   08/16/14 follow up - the patient has been doing well. No recurrent stroke symptoms or seizure. However, he had LGIB in 06/2014 and was admitted. Hb as low as to 5.4. He refused blood transfusion. His ASA was d/c'ed. LGIB gradually stopped and last check Hb was 10.4. Currently, he feels good without any abdominal pain and no bleeding. He is in the waiting list for kidney transplantation. BP today 130/86.  11/02/14 follow up - the patient has been doing well. No recurrent symptoms. His GIB resolved and he is on ASA without problems. BP 116/75 today. No HA or seizure. No vision changes.    Interval History During the interval time, he has been doing well. He went to ED on 02/05/15 for migraine headache. CT brain no acute findings. Improved after pain management in ED. Given fioricet for HA and as per report he felt a lot better with the medication. Otherwise, pt has no complains. Continued on HD 3/week.   REVIEW OF SYSTEMS: Full 14 system review  of systems performed and notable only for those listed below and in HPI above, all others are negative:  Constitutional:   Cardiovascular:  Ear/Nose/Throat:   Skin:  Eyes:  Eye redness, loss of vision Respiratory:   Gastroitestinal:   Genitourinary:  Hematology/Lymphatic:   Endocrine:  Musculoskeletal:   Allergy/Immunology:   Neurological:  HA Psychiatric:  Sleep:   The following represents the patient's updated allergies and side effects list: Allergies  Allergen Reactions  . Infed [Iron Dextran] Other (See Comments)    Decreased BP   . Oxycodone Nausea Only  . Vicodin [Hydrocodone-Acetaminophen] Other (See Comments)    Decreased BP  . Diclofenac Other (See Comments)    Unknown  . Whole Blood Other (See Comments)    Unk    The neurologically relevant items on the patient's problem list were reviewed on today's visit.  Neurologic Examination  A problem focused neurological exam (12 or more points of the single system neurologic examination, vital signs counts as 1 point, cranial nerves count for 8 points) was performed.  Blood pressure 107/69, pulse 83, height 6\' 1"  (1.854 m), weight 217 lb (98.431 kg).  General - Well nourished, well developed, in no apparent distress.  Ophthalmologic - Sharp disc margins OU.   Cardiovascular - Regular rate and rhythm with no murmur.  Mental Status -  Level of arousal and orientation to time, place, and person were intact. Language including expression, naming, repetition, comprehension was  assessed and found intact. Attention span and concentration were normal. Fund of Knowledge was assessed and was intact.  Cranial Nerves II - XII - II - Visual field intact OU. III, IV, VI - Extraocular movements intact. V - Facial sensation intact bilaterally. VII - Facial movement intact bilaterally. VIII - Hearing & vestibular intact bilaterally. X - Palate elevates symmetrically. XI - Chin turning & shoulder shrug intact  bilaterally. XII - Tongue protrusion intact.  Motor Strength - The patient's strength was normal in all extremities and pronator drift was absent.  Bulk was normal and fasciculations were absent.   Motor Tone - Muscle tone was assessed at the neck and appendages and was normal.  Reflexes - The patient's reflexes were 1+ in all extremities and he had no pathological reflexes.  Sensory - Light touch, temperature/pinprick were assessed and were normal.    Coordination - The patient had normal movements in the hands and feet with no ataxia or dysmetria.  Tremor was absent.  Gait and Station - The patient's transfers, posture, gait, station, and turns were observed as normal.  Data reviewed: I personally reviewed the images and agree with the radiology interpretations.  Ct Head (brain) Wo Contrast 05/22/2014 Prominent dural calcification along the falx and tentorium again demonstrated. 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 No acute intracranial abnormalities. No significant change since previous study.   MRI HEAD  05/23/2014 Diffuse dural calcifications/dural thickening. Parietal-occipital FLAIR abnormality greater on the right suspicious for posterior reversible encephalopathy syndrome. Other potential causes for this appearance include that secondary to; artifact, meningitis, subarachnoid hemorrhage or result of external supplemental oxygen. Adequate flow within the superior sagittal sinus cannot be confirmed. This appearance may be chronic and related to the adjacent dural thickening however, sagittal sinus thrombosis could contribute to the parietal occipital FLAIR abnormality. Suggestion of tiny acute posterior left corona radiata infarct. Subtle restricted motion medial aspect of the right parietal -occipital lobe may represent result of posterior reversible encephalopathy syndrome or small infarct. Abnormal vessel extends from the left internal cerebral vein level along the left  ambient cistern to the superior margin of the left internal auditory canal. Question small dural fistula. Opacification left petrous apex. Abnormal bone marrow may represent anemia of renal disease. 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 Left parotid 1.4 x 1 centimeter nonspecific lesion incompletely assessed on present exam.   MRA HEAD  05/23/2014 Exam is motion degraded. Mild narrowing and irregularity right internal carotid artery cavernous segment without high-grade stenosis. Prominent left posterior communicating artery origin infundibulum without discrete aneurysm. Hypoplastic A1 segment right anterior cerebral artery. Azygous configuration A2 segment anterior cerebral artery. Middle cerebral artery mild to moderate branch vessel narrowing and irregularity. Non visualized left posterior inferior cerebellar artery. Mild narrowing distal right vertebral artery. Nonvisualized right anterior inferior cerebellar artery. Posterior cerebral artery branch vessel irregularity. Irregularity and narrowing superior cerebellar artery more notable on the right.   2D echo - - Left ventricle: The cavity size was normal. Systolic function was normal. The estimated ejection fraction was in the range of 55% to 60%. Wall motion was normal; there were no regional wall motion abnormalities. Doppler parameters are consistent with abnormal left ventricular relaxation (grade 1 diastolic dysfunction). - Pericardium, extracardiac: A trivial pericardial effusion was identified.  CUS - Bilateral: 1-39% ICA stenosis. Vertebral artery flow is antegrade.  MRI head 09/16/14 - Abnormal MRI scan of the brain showing diffuse dural thickening with abnormal appearance of bilateral parieto-occipital cortex on T2/FLAIR images the finding of  unclear significance with a differential discussed above. Stable incidental findings of Tornwaldt's cysts, opacification of the petrous apex and abnormal vessel in the ambient  cistern.  MRA head 09/23/14 - Equivocal MRA head (without) demonstrating: 1. Small (8mm) posterior outpouching of the left left supra-clinoid internal carotid artery, may represent infundibular dilation at origin of left posterior communicating artery vs aneurysmal dilation. 2. No focal stenoses or occlusions of the medium to large sized vessels.   MRV 09/16/14 - Highly abnormal appearance of superior sagittal sinus with diminutive caliber likely related to patient's chronic dural thickening. Abnormal appearance of deep cerebral venous system likely related to degenerative caliber from dural thickening versus partial thrombosis. Correlate with catheter angiogram if clinically necessary  CT head 02/05/15 1. No acute intracranial abnormality. 2. Stable appearance of the head with prominence dural calcifications.  Component     Latest Ref Rng 05/23/2014 07/14/2014  Cholesterol     0 - 200 mg/dL 154   Triglycerides     <150 mg/dL 50   HDL Cholesterol     >39 mg/dL 53   Total CHOL/HDL Ratio      2.9   VLDL     0 - 40 mg/dL 10   LDL (calc)     0 - 99 mg/dL 91   Hemoglobin A1C     4.8 - 5.6 % 5.4   Mean Plasma Glucose      108   HIV Screen 4th Generation wRfx     Non Reactive  Non Reactive  Vitamin B-12     180 - 914 pg/mL  1227 (H)    Assessment: As you may recall, he is a 55 y.o. African American male with PMH of HTN, ESRD on HD was admitted for acute onset of dizziness on 05/22/14. 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 Also, right occipital pole has tiny DWI changes which may related to PRES. Stroke work up including TTE and CUS unremarkable. LDL 91. His dizziness resolved and he was discharged with ASA 325 and lipitor. During the interval time, he developed LGIB, ASA was d/c'ed. His Hb now back to 10.4 and he feels good. Will need to resume baby ASA for stroke prevention. To document the resolution of  PRES and to rule out CVT as there is some concern about cerebral veins in MRI, we repeated MRI, MRA and MRV and found that pt continued to have T2 hyperintensity bilateral parieto-occipital coretex and also has significant dural thickening with abnormal appearance of deep cerebral venous system with degenerative caliber, which may be able to explain the possible edema at bilateral parietooccipital region. 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 Not sure if there is any association with ESRD on HD. However, currently pt has no sign of chronic infection, no hydrocephalus, no complaint of HA or vision changes. Will continue to monitor and repeat MRI in 6 months.   Plan:  - continue ASA and lipitor for stroke prevention - will repeat MRI brain and MRV for monitoring - Follow up with your primary care physician for stroke risk factor modification. Recommend maintain blood pressure goal <130/80, diabetes with hemoglobin A1c goal below 6.5% and lipids with LDL cholesterol goal below 70 mg/dL.  - use fioricet as early as possible for migraine episode. Recommend no more than 2 pills a day and no more than 5 pills a week.  -  follow up with nephrology for ESRD on HD - follow up with cardiology and GI regularly - check BP at home - follow up in 6 months.  I spent more than 25 minutes of face to face time with the patient. Greater than 50% of time was spent in counseling and coordination of care. We have discussed about differential diagnosis of hypertrophic pachymeningitis and MRI MRV monitoring.    Orders Placed This Encounter  Procedures  . MR Brain Wo Contrast    Standing Status: Future     Number of Occurrences:      Standing Expiration Date: 07/06/2016    Scheduling Instructions:     Pt can do Tuesday or Thursday    Order Specific Question:  Reason for Exam (SYMPTOM  OR DIAGNOSIS REQUIRED)    Answer:  follow up with abnormal signal  right parietal occipital    Order Specific Question:  Preferred imaging location?    Answer:  Internal    Order Specific Question:  Does the patient have a pacemaker or implanted devices?    Answer:  No    Order Specific Question:  What is the patient's sedation requirement?    Answer:  No Sedation  . MR MRV HEAD WO CM    Standing Status: Future     Number of Occurrences:      Standing Expiration Date: 07/04/2016    Scheduling Instructions:     Pt can do Tuesday or Thursday.    Order Specific Question:  Reason for Exam (SYMPTOM  OR DIAGNOSIS REQUIRED)    Answer:  abnormal venous system follow up    Order Specific Question:  Preferred imaging location?    Answer:  Internal    Order Specific Question:  What is the patient's sedation requirement?    Answer:  No Sedation    Order Specific Question:  Does the patient have a pacemaker or implanted devices?    Answer:  No    No orders of the defined types were placed in this encounter.    Patient Instructions  - continue ASA and lipitor for stroke prevention - will repeat MRI brain and MRV for monitoring - Follow up with your primary care physician for stroke risk factor modification. Recommend maintain blood pressure goal <130/80, diabetes with hemoglobin A1c goal below 6.5% and lipids with LDL cholesterol goal below 70 mg/dL.  - use fioricet as early as possible for migraine episode. Recommend no more than 2 pills a day and no more than 5 pills a week.  - follow up with nephrology for ESRD on HD - follow up with cardiology and GI regularly - check BP at home - follow up in 6 months.    Rosalin Hawking, MD PhD [facility] [address] [place], [place] [address] [phone]

## 2015-05-06 DIAGNOSIS — N186 End stage renal disease: Secondary | ICD-10-CM | POA: Diagnosis not present

## 2015-05-06 DIAGNOSIS — E875 Hyperkalemia: Secondary | ICD-10-CM | POA: Diagnosis not present

## 2015-05-06 DIAGNOSIS — E1129 Type 2 diabetes mellitus with other diabetic kidney complication: Secondary | ICD-10-CM | POA: Diagnosis not present

## 2015-05-06 DIAGNOSIS — D631 Anemia in chronic kidney disease: Secondary | ICD-10-CM | POA: Diagnosis not present

## 2015-05-06 DIAGNOSIS — N2581 Secondary hyperparathyroidism of renal origin: Secondary | ICD-10-CM | POA: Diagnosis not present

## 2015-05-06 DIAGNOSIS — D509 Iron deficiency anemia, unspecified: Secondary | ICD-10-CM | POA: Diagnosis not present

## 2015-05-09 DIAGNOSIS — E1129 Type 2 diabetes mellitus with other diabetic kidney complication: Secondary | ICD-10-CM | POA: Diagnosis not present

## 2015-05-09 DIAGNOSIS — D631 Anemia in chronic kidney disease: Secondary | ICD-10-CM | POA: Diagnosis not present

## 2015-05-09 DIAGNOSIS — N2581 Secondary hyperparathyroidism of renal origin: Secondary | ICD-10-CM | POA: Diagnosis not present

## 2015-05-09 DIAGNOSIS — N186 End stage renal disease: Secondary | ICD-10-CM | POA: Diagnosis not present

## 2015-05-09 DIAGNOSIS — E875 Hyperkalemia: Secondary | ICD-10-CM | POA: Diagnosis not present

## 2015-05-09 DIAGNOSIS — D509 Iron deficiency anemia, unspecified: Secondary | ICD-10-CM | POA: Diagnosis not present

## 2015-05-10 DIAGNOSIS — I871 Compression of vein: Secondary | ICD-10-CM | POA: Diagnosis not present

## 2015-05-10 DIAGNOSIS — N186 End stage renal disease: Secondary | ICD-10-CM | POA: Diagnosis not present

## 2015-05-10 DIAGNOSIS — Z992 Dependence on renal dialysis: Secondary | ICD-10-CM | POA: Diagnosis not present

## 2015-05-10 DIAGNOSIS — T82868D Thrombosis of vascular prosthetic devices, implants and grafts, subsequent encounter: Secondary | ICD-10-CM | POA: Diagnosis not present

## 2015-05-11 DIAGNOSIS — E1129 Type 2 diabetes mellitus with other diabetic kidney complication: Secondary | ICD-10-CM | POA: Diagnosis not present

## 2015-05-11 DIAGNOSIS — N2581 Secondary hyperparathyroidism of renal origin: Secondary | ICD-10-CM | POA: Diagnosis not present

## 2015-05-11 DIAGNOSIS — E875 Hyperkalemia: Secondary | ICD-10-CM | POA: Diagnosis not present

## 2015-05-11 DIAGNOSIS — N186 End stage renal disease: Secondary | ICD-10-CM | POA: Diagnosis not present

## 2015-05-11 DIAGNOSIS — D509 Iron deficiency anemia, unspecified: Secondary | ICD-10-CM | POA: Diagnosis not present

## 2015-05-11 DIAGNOSIS — D631 Anemia in chronic kidney disease: Secondary | ICD-10-CM | POA: Diagnosis not present

## 2015-05-12 ENCOUNTER — Ambulatory Visit
Admission: RE | Admit: 2015-05-12 | Discharge: 2015-05-12 | Disposition: A | Discharge status: Home / Self Care | Payer: Medicare Other | Source: Ambulatory Visit | Attending: Neurology | Admitting: Neurology

## 2015-05-12 DIAGNOSIS — G039 Meningitis, unspecified: Secondary | ICD-10-CM

## 2015-05-12 DIAGNOSIS — I6783 Posterior reversible encephalopathy syndrome: Secondary | ICD-10-CM | POA: Diagnosis not present

## 2015-05-12 DIAGNOSIS — G031 Chronic meningitis: Secondary | ICD-10-CM

## 2015-05-13 DIAGNOSIS — Z992 Dependence on renal dialysis: Secondary | ICD-10-CM | POA: Diagnosis not present

## 2015-05-13 DIAGNOSIS — E1129 Type 2 diabetes mellitus with other diabetic kidney complication: Secondary | ICD-10-CM | POA: Diagnosis not present

## 2015-05-13 DIAGNOSIS — D509 Iron deficiency anemia, unspecified: Secondary | ICD-10-CM | POA: Diagnosis not present

## 2015-05-13 DIAGNOSIS — E875 Hyperkalemia: Secondary | ICD-10-CM | POA: Diagnosis not present

## 2015-05-13 DIAGNOSIS — N2581 Secondary hyperparathyroidism of renal origin: Secondary | ICD-10-CM | POA: Diagnosis not present

## 2015-05-13 DIAGNOSIS — D631 Anemia in chronic kidney disease: Secondary | ICD-10-CM | POA: Diagnosis not present

## 2015-05-13 DIAGNOSIS — I12 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 stage renal disease: Secondary | ICD-10-CM | POA: Diagnosis not present

## 2015-05-13 DIAGNOSIS — N186 End stage renal disease: Secondary | ICD-10-CM | POA: Diagnosis not present

## 2015-05-13 NOTE — Telephone Encounter (Addendum)
-----   Message from Gean Birchwood, DPM sent at 05/10/2015  7:52 AM EDT ----- Lower extremity arterial Doppler 05/05/2015 Impressions No evidence of segmental lower extremity arterial disease at rest, bilaterally. Normal ABI's, bilaterally. Normal great toe-brachial indices, and normal toe PPG waveforms.  Contact patient and advised him lab results within normal limits and no follow-up needed.  05/13/2015-Informed pt of Dr. Phoebe Perch statement concerning arterial dopplers.

## 2015-05-16 ENCOUNTER — Telehealth: Payer: Self-pay | Admitting: *Deleted

## 2015-05-16 DIAGNOSIS — D631 Anemia in chronic kidney disease: Secondary | ICD-10-CM | POA: Diagnosis not present

## 2015-05-16 DIAGNOSIS — E875 Hyperkalemia: Secondary | ICD-10-CM | POA: Diagnosis not present

## 2015-05-16 DIAGNOSIS — N2581 Secondary hyperparathyroidism of renal origin: Secondary | ICD-10-CM | POA: Diagnosis not present

## 2015-05-16 DIAGNOSIS — E1129 Type 2 diabetes mellitus with other diabetic kidney complication: Secondary | ICD-10-CM | POA: Diagnosis not present

## 2015-05-16 DIAGNOSIS — Z992 Dependence on renal dialysis: Secondary | ICD-10-CM | POA: Diagnosis not present

## 2015-05-16 DIAGNOSIS — N186 End stage renal disease: Secondary | ICD-10-CM | POA: Diagnosis not present

## 2015-05-16 DIAGNOSIS — D509 Iron deficiency anemia, unspecified: Secondary | ICD-10-CM | POA: Diagnosis not present

## 2015-05-16 NOTE — Telephone Encounter (Signed)
Spoke with patient and informed him, per Dr Erlinda Hong, that the MRI done recently in our office showed no change comparing with the one in 09/2014. Advised he please continue current treatment. Patient verbalized understanding, appreciation.

## 2015-05-17 ENCOUNTER — Ambulatory Visit
Admission: RE | Admit: 2015-05-17 | Discharge: 2015-05-17 | Disposition: A | Discharge status: Home / Self Care | Payer: Medicare Other | Source: Ambulatory Visit | Attending: Neurology | Admitting: Neurology

## 2015-05-17 DIAGNOSIS — I63312 Cerebral infarction due to thrombosis of left middle cerebral artery: Secondary | ICD-10-CM | POA: Diagnosis not present

## 2015-05-17 DIAGNOSIS — G4489 Other headache syndrome: Secondary | ICD-10-CM

## 2015-05-18 DIAGNOSIS — Z992 Dependence on renal dialysis: Secondary | ICD-10-CM | POA: Diagnosis not present

## 2015-05-18 DIAGNOSIS — N186 End stage renal disease: Secondary | ICD-10-CM | POA: Diagnosis not present

## 2015-05-18 DIAGNOSIS — E1129 Type 2 diabetes mellitus with other diabetic kidney complication: Secondary | ICD-10-CM | POA: Diagnosis not present

## 2015-05-18 DIAGNOSIS — D631 Anemia in chronic kidney disease: Secondary | ICD-10-CM | POA: Diagnosis not present

## 2015-05-18 DIAGNOSIS — N2581 Secondary hyperparathyroidism of renal origin: Secondary | ICD-10-CM | POA: Diagnosis not present

## 2015-05-18 DIAGNOSIS — E875 Hyperkalemia: Secondary | ICD-10-CM | POA: Diagnosis not present

## 2015-05-20 DIAGNOSIS — N186 End stage renal disease: Secondary | ICD-10-CM | POA: Diagnosis not present

## 2015-05-20 DIAGNOSIS — Z992 Dependence on renal dialysis: Secondary | ICD-10-CM | POA: Diagnosis not present

## 2015-05-20 DIAGNOSIS — E1129 Type 2 diabetes mellitus with other diabetic kidney complication: Secondary | ICD-10-CM | POA: Diagnosis not present

## 2015-05-20 DIAGNOSIS — D631 Anemia in chronic kidney disease: Secondary | ICD-10-CM | POA: Diagnosis not present

## 2015-05-20 DIAGNOSIS — N2581 Secondary hyperparathyroidism of renal origin: Secondary | ICD-10-CM | POA: Diagnosis not present

## 2015-05-20 DIAGNOSIS — E875 Hyperkalemia: Secondary | ICD-10-CM | POA: Diagnosis not present

## 2015-05-23 DIAGNOSIS — E1129 Type 2 diabetes mellitus with other diabetic kidney complication: Secondary | ICD-10-CM | POA: Diagnosis not present

## 2015-05-23 DIAGNOSIS — N186 End stage renal disease: Secondary | ICD-10-CM | POA: Diagnosis not present

## 2015-05-23 DIAGNOSIS — Z992 Dependence on renal dialysis: Secondary | ICD-10-CM | POA: Diagnosis not present

## 2015-05-23 DIAGNOSIS — E875 Hyperkalemia: Secondary | ICD-10-CM | POA: Diagnosis not present

## 2015-05-23 DIAGNOSIS — N2581 Secondary hyperparathyroidism of renal origin: Secondary | ICD-10-CM | POA: Diagnosis not present

## 2015-05-23 DIAGNOSIS — D631 Anemia in chronic kidney disease: Secondary | ICD-10-CM | POA: Diagnosis not present

## 2015-05-25 DIAGNOSIS — E1129 Type 2 diabetes mellitus with other diabetic kidney complication: Secondary | ICD-10-CM | POA: Diagnosis not present

## 2015-05-25 DIAGNOSIS — E875 Hyperkalemia: Secondary | ICD-10-CM | POA: Diagnosis not present

## 2015-05-25 DIAGNOSIS — N2581 Secondary hyperparathyroidism of renal origin: Secondary | ICD-10-CM | POA: Diagnosis not present

## 2015-05-25 DIAGNOSIS — Z992 Dependence on renal dialysis: Secondary | ICD-10-CM | POA: Diagnosis not present

## 2015-05-25 DIAGNOSIS — N186 End stage renal disease: Secondary | ICD-10-CM | POA: Diagnosis not present

## 2015-05-25 DIAGNOSIS — D631 Anemia in chronic kidney disease: Secondary | ICD-10-CM | POA: Diagnosis not present

## 2015-05-27 DIAGNOSIS — E1129 Type 2 diabetes mellitus with other diabetic kidney complication: Secondary | ICD-10-CM | POA: Diagnosis not present

## 2015-05-27 DIAGNOSIS — E875 Hyperkalemia: Secondary | ICD-10-CM | POA: Diagnosis not present

## 2015-05-27 DIAGNOSIS — N2581 Secondary hyperparathyroidism of renal origin: Secondary | ICD-10-CM | POA: Diagnosis not present

## 2015-05-27 DIAGNOSIS — N186 End stage renal disease: Secondary | ICD-10-CM | POA: Diagnosis not present

## 2015-05-27 DIAGNOSIS — D631 Anemia in chronic kidney disease: Secondary | ICD-10-CM | POA: Diagnosis not present

## 2015-05-27 DIAGNOSIS — Z992 Dependence on renal dialysis: Secondary | ICD-10-CM | POA: Diagnosis not present

## 2015-05-30 DIAGNOSIS — E1129 Type 2 diabetes mellitus with other diabetic kidney complication: Secondary | ICD-10-CM | POA: Diagnosis not present

## 2015-05-30 DIAGNOSIS — Z992 Dependence on renal dialysis: Secondary | ICD-10-CM | POA: Diagnosis not present

## 2015-05-30 DIAGNOSIS — N186 End stage renal disease: Secondary | ICD-10-CM | POA: Diagnosis not present

## 2015-05-30 DIAGNOSIS — N2581 Secondary hyperparathyroidism of renal origin: Secondary | ICD-10-CM | POA: Diagnosis not present

## 2015-05-30 DIAGNOSIS — E875 Hyperkalemia: Secondary | ICD-10-CM | POA: Diagnosis not present

## 2015-05-30 DIAGNOSIS — D631 Anemia in chronic kidney disease: Secondary | ICD-10-CM | POA: Diagnosis not present

## 2015-06-01 DIAGNOSIS — E875 Hyperkalemia: Secondary | ICD-10-CM | POA: Diagnosis not present

## 2015-06-01 DIAGNOSIS — D631 Anemia in chronic kidney disease: Secondary | ICD-10-CM | POA: Diagnosis not present

## 2015-06-01 DIAGNOSIS — N2581 Secondary hyperparathyroidism of renal origin: Secondary | ICD-10-CM | POA: Diagnosis not present

## 2015-06-01 DIAGNOSIS — E1129 Type 2 diabetes mellitus with other diabetic kidney complication: Secondary | ICD-10-CM | POA: Diagnosis not present

## 2015-06-01 DIAGNOSIS — Z992 Dependence on renal dialysis: Secondary | ICD-10-CM | POA: Diagnosis not present

## 2015-06-01 DIAGNOSIS — N186 End stage renal disease: Secondary | ICD-10-CM | POA: Diagnosis not present

## 2015-06-04 DIAGNOSIS — Z992 Dependence on renal dialysis: Secondary | ICD-10-CM | POA: Diagnosis not present

## 2015-06-04 DIAGNOSIS — E875 Hyperkalemia: Secondary | ICD-10-CM | POA: Diagnosis not present

## 2015-06-04 DIAGNOSIS — N186 End stage renal disease: Secondary | ICD-10-CM | POA: Diagnosis not present

## 2015-06-04 DIAGNOSIS — D631 Anemia in chronic kidney disease: Secondary | ICD-10-CM | POA: Diagnosis not present

## 2015-06-04 DIAGNOSIS — E1129 Type 2 diabetes mellitus with other diabetic kidney complication: Secondary | ICD-10-CM | POA: Diagnosis not present

## 2015-06-04 DIAGNOSIS — N2581 Secondary hyperparathyroidism of renal origin: Secondary | ICD-10-CM | POA: Diagnosis not present

## 2015-06-06 DIAGNOSIS — N186 End stage renal disease: Secondary | ICD-10-CM | POA: Diagnosis not present

## 2015-06-06 DIAGNOSIS — E1129 Type 2 diabetes mellitus with other diabetic kidney complication: Secondary | ICD-10-CM | POA: Diagnosis not present

## 2015-06-06 DIAGNOSIS — Z992 Dependence on renal dialysis: Secondary | ICD-10-CM | POA: Diagnosis not present

## 2015-06-06 DIAGNOSIS — N2581 Secondary hyperparathyroidism of renal origin: Secondary | ICD-10-CM | POA: Diagnosis not present

## 2015-06-06 DIAGNOSIS — E875 Hyperkalemia: Secondary | ICD-10-CM | POA: Diagnosis not present

## 2015-06-06 DIAGNOSIS — D631 Anemia in chronic kidney disease: Secondary | ICD-10-CM | POA: Diagnosis not present

## 2015-06-08 DIAGNOSIS — E875 Hyperkalemia: Secondary | ICD-10-CM | POA: Diagnosis not present

## 2015-06-08 DIAGNOSIS — N186 End stage renal disease: Secondary | ICD-10-CM | POA: Diagnosis not present

## 2015-06-08 DIAGNOSIS — E1129 Type 2 diabetes mellitus with other diabetic kidney complication: Secondary | ICD-10-CM | POA: Diagnosis not present

## 2015-06-08 DIAGNOSIS — D631 Anemia in chronic kidney disease: Secondary | ICD-10-CM | POA: Diagnosis not present

## 2015-06-08 DIAGNOSIS — Z992 Dependence on renal dialysis: Secondary | ICD-10-CM | POA: Diagnosis not present

## 2015-06-08 DIAGNOSIS — N2581 Secondary hyperparathyroidism of renal origin: Secondary | ICD-10-CM | POA: Diagnosis not present

## 2015-06-10 DIAGNOSIS — D631 Anemia in chronic kidney disease: Secondary | ICD-10-CM | POA: Diagnosis not present

## 2015-06-10 DIAGNOSIS — N186 End stage renal disease: Secondary | ICD-10-CM | POA: Diagnosis not present

## 2015-06-10 DIAGNOSIS — Z992 Dependence on renal dialysis: Secondary | ICD-10-CM | POA: Diagnosis not present

## 2015-06-10 DIAGNOSIS — E875 Hyperkalemia: Secondary | ICD-10-CM | POA: Diagnosis not present

## 2015-06-10 DIAGNOSIS — E1129 Type 2 diabetes mellitus with other diabetic kidney complication: Secondary | ICD-10-CM | POA: Diagnosis not present

## 2015-06-10 DIAGNOSIS — N2581 Secondary hyperparathyroidism of renal origin: Secondary | ICD-10-CM | POA: Diagnosis not present

## 2015-06-12 DIAGNOSIS — I12 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 stage renal disease: Secondary | ICD-10-CM | POA: Diagnosis not present

## 2015-06-12 DIAGNOSIS — Z992 Dependence on renal dialysis: Secondary | ICD-10-CM | POA: Diagnosis not present

## 2015-06-12 DIAGNOSIS — N186 End stage renal disease: Secondary | ICD-10-CM | POA: Diagnosis not present

## 2015-06-13 DIAGNOSIS — D631 Anemia in chronic kidney disease: Secondary | ICD-10-CM | POA: Diagnosis not present

## 2015-06-13 DIAGNOSIS — D509 Iron deficiency anemia, unspecified: Secondary | ICD-10-CM | POA: Diagnosis not present

## 2015-06-13 DIAGNOSIS — N186 End stage renal disease: Secondary | ICD-10-CM | POA: Diagnosis not present

## 2015-06-13 DIAGNOSIS — N2581 Secondary hyperparathyroidism of renal origin: Secondary | ICD-10-CM | POA: Diagnosis not present

## 2015-06-13 DIAGNOSIS — E875 Hyperkalemia: Secondary | ICD-10-CM | POA: Diagnosis not present

## 2015-06-13 DIAGNOSIS — E1129 Type 2 diabetes mellitus with other diabetic kidney complication: Secondary | ICD-10-CM | POA: Diagnosis not present

## 2015-06-15 DIAGNOSIS — E1129 Type 2 diabetes mellitus with other diabetic kidney complication: Secondary | ICD-10-CM | POA: Diagnosis not present

## 2015-06-15 DIAGNOSIS — E875 Hyperkalemia: Secondary | ICD-10-CM | POA: Diagnosis not present

## 2015-06-15 DIAGNOSIS — D509 Iron deficiency anemia, unspecified: Secondary | ICD-10-CM | POA: Diagnosis not present

## 2015-06-15 DIAGNOSIS — N2581 Secondary hyperparathyroidism of renal origin: Secondary | ICD-10-CM | POA: Diagnosis not present

## 2015-06-15 DIAGNOSIS — D631 Anemia in chronic kidney disease: Secondary | ICD-10-CM | POA: Diagnosis not present

## 2015-06-15 DIAGNOSIS — H2513 Age-related nuclear cataract, bilateral: Secondary | ICD-10-CM | POA: Diagnosis not present

## 2015-06-15 DIAGNOSIS — N186 End stage renal disease: Secondary | ICD-10-CM | POA: Diagnosis not present

## 2015-06-15 NOTE — Telephone Encounter (Signed)
Please let the patient know that both MRI and MRV brain showed no change comparing with the one in 09/2014. Please continue current treatment without change. Thanks.  Rosalin Hawking, MD PhD Stroke Neurology 06/15/2015 5:17 PM

## 2015-06-15 NOTE — Telephone Encounter (Signed)
Pt called wanting MRA results. Please call and advise

## 2015-06-16 NOTE — Telephone Encounter (Signed)
LFt vm for patient that MRI and MRV brain scan showed not change comparing to one done 09/2014. Please continue treatment.

## 2015-06-17 DIAGNOSIS — D509 Iron deficiency anemia, unspecified: Secondary | ICD-10-CM | POA: Diagnosis not present

## 2015-06-17 DIAGNOSIS — N2581 Secondary hyperparathyroidism of renal origin: Secondary | ICD-10-CM | POA: Diagnosis not present

## 2015-06-17 DIAGNOSIS — E875 Hyperkalemia: Secondary | ICD-10-CM | POA: Diagnosis not present

## 2015-06-17 DIAGNOSIS — N186 End stage renal disease: Secondary | ICD-10-CM | POA: Diagnosis not present

## 2015-06-17 DIAGNOSIS — E1129 Type 2 diabetes mellitus with other diabetic kidney complication: Secondary | ICD-10-CM | POA: Diagnosis not present

## 2015-06-17 DIAGNOSIS — D631 Anemia in chronic kidney disease: Secondary | ICD-10-CM | POA: Diagnosis not present

## 2015-06-20 DIAGNOSIS — N2581 Secondary hyperparathyroidism of renal origin: Secondary | ICD-10-CM | POA: Diagnosis not present

## 2015-06-20 DIAGNOSIS — D631 Anemia in chronic kidney disease: Secondary | ICD-10-CM | POA: Diagnosis not present

## 2015-06-20 DIAGNOSIS — D509 Iron deficiency anemia, unspecified: Secondary | ICD-10-CM | POA: Diagnosis not present

## 2015-06-20 DIAGNOSIS — E875 Hyperkalemia: Secondary | ICD-10-CM | POA: Diagnosis not present

## 2015-06-20 DIAGNOSIS — N186 End stage renal disease: Secondary | ICD-10-CM | POA: Diagnosis not present

## 2015-06-20 DIAGNOSIS — E1129 Type 2 diabetes mellitus with other diabetic kidney complication: Secondary | ICD-10-CM | POA: Diagnosis not present

## 2015-06-22 DIAGNOSIS — D631 Anemia in chronic kidney disease: Secondary | ICD-10-CM | POA: Diagnosis not present

## 2015-06-22 DIAGNOSIS — N186 End stage renal disease: Secondary | ICD-10-CM | POA: Diagnosis not present

## 2015-06-22 DIAGNOSIS — N2581 Secondary hyperparathyroidism of renal origin: Secondary | ICD-10-CM | POA: Diagnosis not present

## 2015-06-22 DIAGNOSIS — E875 Hyperkalemia: Secondary | ICD-10-CM | POA: Diagnosis not present

## 2015-06-22 DIAGNOSIS — D509 Iron deficiency anemia, unspecified: Secondary | ICD-10-CM | POA: Diagnosis not present

## 2015-06-22 DIAGNOSIS — E1129 Type 2 diabetes mellitus with other diabetic kidney complication: Secondary | ICD-10-CM | POA: Diagnosis not present

## 2015-06-24 DIAGNOSIS — E875 Hyperkalemia: Secondary | ICD-10-CM | POA: Diagnosis not present

## 2015-06-24 DIAGNOSIS — N2581 Secondary hyperparathyroidism of renal origin: Secondary | ICD-10-CM | POA: Diagnosis not present

## 2015-06-24 DIAGNOSIS — E1129 Type 2 diabetes mellitus with other diabetic kidney complication: Secondary | ICD-10-CM | POA: Diagnosis not present

## 2015-06-24 DIAGNOSIS — N186 End stage renal disease: Secondary | ICD-10-CM | POA: Diagnosis not present

## 2015-06-24 DIAGNOSIS — D631 Anemia in chronic kidney disease: Secondary | ICD-10-CM | POA: Diagnosis not present

## 2015-06-24 DIAGNOSIS — D509 Iron deficiency anemia, unspecified: Secondary | ICD-10-CM | POA: Diagnosis not present

## 2015-06-27 DIAGNOSIS — D631 Anemia in chronic kidney disease: Secondary | ICD-10-CM | POA: Diagnosis not present

## 2015-06-27 DIAGNOSIS — H268 Other specified cataract: Secondary | ICD-10-CM | POA: Diagnosis not present

## 2015-06-27 DIAGNOSIS — D509 Iron deficiency anemia, unspecified: Secondary | ICD-10-CM | POA: Diagnosis not present

## 2015-06-27 DIAGNOSIS — E875 Hyperkalemia: Secondary | ICD-10-CM | POA: Diagnosis not present

## 2015-06-27 DIAGNOSIS — H2511 Age-related nuclear cataract, right eye: Secondary | ICD-10-CM | POA: Diagnosis not present

## 2015-06-27 DIAGNOSIS — N2581 Secondary hyperparathyroidism of renal origin: Secondary | ICD-10-CM | POA: Diagnosis not present

## 2015-06-27 DIAGNOSIS — E1129 Type 2 diabetes mellitus with other diabetic kidney complication: Secondary | ICD-10-CM | POA: Diagnosis not present

## 2015-06-27 DIAGNOSIS — N186 End stage renal disease: Secondary | ICD-10-CM | POA: Diagnosis not present

## 2015-06-29 DIAGNOSIS — N2581 Secondary hyperparathyroidism of renal origin: Secondary | ICD-10-CM | POA: Diagnosis not present

## 2015-06-29 DIAGNOSIS — E1129 Type 2 diabetes mellitus with other diabetic kidney complication: Secondary | ICD-10-CM | POA: Diagnosis not present

## 2015-06-29 DIAGNOSIS — N186 End stage renal disease: Secondary | ICD-10-CM | POA: Diagnosis not present

## 2015-06-29 DIAGNOSIS — D509 Iron deficiency anemia, unspecified: Secondary | ICD-10-CM | POA: Diagnosis not present

## 2015-06-29 DIAGNOSIS — D631 Anemia in chronic kidney disease: Secondary | ICD-10-CM | POA: Diagnosis not present

## 2015-06-29 DIAGNOSIS — E875 Hyperkalemia: Secondary | ICD-10-CM | POA: Diagnosis not present

## 2015-07-01 DIAGNOSIS — D631 Anemia in chronic kidney disease: Secondary | ICD-10-CM | POA: Diagnosis not present

## 2015-07-01 DIAGNOSIS — D509 Iron deficiency anemia, unspecified: Secondary | ICD-10-CM | POA: Diagnosis not present

## 2015-07-01 DIAGNOSIS — N186 End stage renal disease: Secondary | ICD-10-CM | POA: Diagnosis not present

## 2015-07-01 DIAGNOSIS — N2581 Secondary hyperparathyroidism of renal origin: Secondary | ICD-10-CM | POA: Diagnosis not present

## 2015-07-01 DIAGNOSIS — E875 Hyperkalemia: Secondary | ICD-10-CM | POA: Diagnosis not present

## 2015-07-01 DIAGNOSIS — E1129 Type 2 diabetes mellitus with other diabetic kidney complication: Secondary | ICD-10-CM | POA: Diagnosis not present

## 2015-07-04 DIAGNOSIS — D509 Iron deficiency anemia, unspecified: Secondary | ICD-10-CM | POA: Diagnosis not present

## 2015-07-04 DIAGNOSIS — D631 Anemia in chronic kidney disease: Secondary | ICD-10-CM | POA: Diagnosis not present

## 2015-07-04 DIAGNOSIS — E875 Hyperkalemia: Secondary | ICD-10-CM | POA: Diagnosis not present

## 2015-07-04 DIAGNOSIS — N186 End stage renal disease: Secondary | ICD-10-CM | POA: Diagnosis not present

## 2015-07-04 DIAGNOSIS — N2581 Secondary hyperparathyroidism of renal origin: Secondary | ICD-10-CM | POA: Diagnosis not present

## 2015-07-04 DIAGNOSIS — E1129 Type 2 diabetes mellitus with other diabetic kidney complication: Secondary | ICD-10-CM | POA: Diagnosis not present

## 2015-07-05 DIAGNOSIS — H2512 Age-related nuclear cataract, left eye: Secondary | ICD-10-CM | POA: Diagnosis not present

## 2015-07-06 DIAGNOSIS — D509 Iron deficiency anemia, unspecified: Secondary | ICD-10-CM | POA: Diagnosis not present

## 2015-07-06 DIAGNOSIS — N2581 Secondary hyperparathyroidism of renal origin: Secondary | ICD-10-CM | POA: Diagnosis not present

## 2015-07-06 DIAGNOSIS — E1129 Type 2 diabetes mellitus with other diabetic kidney complication: Secondary | ICD-10-CM | POA: Diagnosis not present

## 2015-07-06 DIAGNOSIS — E875 Hyperkalemia: Secondary | ICD-10-CM | POA: Diagnosis not present

## 2015-07-06 DIAGNOSIS — N186 End stage renal disease: Secondary | ICD-10-CM | POA: Diagnosis not present

## 2015-07-06 DIAGNOSIS — D631 Anemia in chronic kidney disease: Secondary | ICD-10-CM | POA: Diagnosis not present

## 2015-07-08 DIAGNOSIS — N186 End stage renal disease: Secondary | ICD-10-CM | POA: Diagnosis not present

## 2015-07-08 DIAGNOSIS — D509 Iron deficiency anemia, unspecified: Secondary | ICD-10-CM | POA: Diagnosis not present

## 2015-07-08 DIAGNOSIS — D631 Anemia in chronic kidney disease: Secondary | ICD-10-CM | POA: Diagnosis not present

## 2015-07-08 DIAGNOSIS — E1129 Type 2 diabetes mellitus with other diabetic kidney complication: Secondary | ICD-10-CM | POA: Diagnosis not present

## 2015-07-08 DIAGNOSIS — E875 Hyperkalemia: Secondary | ICD-10-CM | POA: Diagnosis not present

## 2015-07-08 DIAGNOSIS — N2581 Secondary hyperparathyroidism of renal origin: Secondary | ICD-10-CM | POA: Diagnosis not present

## 2015-07-11 DIAGNOSIS — D509 Iron deficiency anemia, unspecified: Secondary | ICD-10-CM | POA: Diagnosis not present

## 2015-07-11 DIAGNOSIS — E1129 Type 2 diabetes mellitus with other diabetic kidney complication: Secondary | ICD-10-CM | POA: Diagnosis not present

## 2015-07-11 DIAGNOSIS — D631 Anemia in chronic kidney disease: Secondary | ICD-10-CM | POA: Diagnosis not present

## 2015-07-11 DIAGNOSIS — H2512 Age-related nuclear cataract, left eye: Secondary | ICD-10-CM | POA: Diagnosis not present

## 2015-07-11 DIAGNOSIS — N2581 Secondary hyperparathyroidism of renal origin: Secondary | ICD-10-CM | POA: Diagnosis not present

## 2015-07-11 DIAGNOSIS — E875 Hyperkalemia: Secondary | ICD-10-CM | POA: Diagnosis not present

## 2015-07-11 DIAGNOSIS — H25812 Combined forms of age-related cataract, left eye: Secondary | ICD-10-CM | POA: Diagnosis not present

## 2015-07-11 DIAGNOSIS — N186 End stage renal disease: Secondary | ICD-10-CM | POA: Diagnosis not present

## 2015-07-13 DIAGNOSIS — N2581 Secondary hyperparathyroidism of renal origin: Secondary | ICD-10-CM | POA: Diagnosis not present

## 2015-07-13 DIAGNOSIS — D509 Iron deficiency anemia, unspecified: Secondary | ICD-10-CM | POA: Diagnosis not present

## 2015-07-13 DIAGNOSIS — E1129 Type 2 diabetes mellitus with other diabetic kidney complication: Secondary | ICD-10-CM | POA: Diagnosis not present

## 2015-07-13 DIAGNOSIS — D631 Anemia in chronic kidney disease: Secondary | ICD-10-CM | POA: Diagnosis not present

## 2015-07-13 DIAGNOSIS — Z992 Dependence on renal dialysis: Secondary | ICD-10-CM | POA: Diagnosis not present

## 2015-07-13 DIAGNOSIS — N186 End stage renal disease: Secondary | ICD-10-CM | POA: Diagnosis not present

## 2015-07-13 DIAGNOSIS — E875 Hyperkalemia: Secondary | ICD-10-CM | POA: Diagnosis not present

## 2015-07-13 DIAGNOSIS — I12 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 stage renal disease: Secondary | ICD-10-CM | POA: Diagnosis not present

## 2015-07-15 DIAGNOSIS — E1129 Type 2 diabetes mellitus with other diabetic kidney complication: Secondary | ICD-10-CM | POA: Diagnosis not present

## 2015-07-15 DIAGNOSIS — D509 Iron deficiency anemia, unspecified: Secondary | ICD-10-CM | POA: Diagnosis not present

## 2015-07-15 DIAGNOSIS — N186 End stage renal disease: Secondary | ICD-10-CM | POA: Diagnosis not present

## 2015-07-15 DIAGNOSIS — N2581 Secondary hyperparathyroidism of renal origin: Secondary | ICD-10-CM | POA: Diagnosis not present

## 2015-07-15 DIAGNOSIS — E875 Hyperkalemia: Secondary | ICD-10-CM | POA: Diagnosis not present

## 2015-07-15 DIAGNOSIS — D631 Anemia in chronic kidney disease: Secondary | ICD-10-CM | POA: Diagnosis not present

## 2015-07-18 DIAGNOSIS — N2581 Secondary hyperparathyroidism of renal origin: Secondary | ICD-10-CM | POA: Diagnosis not present

## 2015-07-18 DIAGNOSIS — D509 Iron deficiency anemia, unspecified: Secondary | ICD-10-CM | POA: Diagnosis not present

## 2015-07-18 DIAGNOSIS — D631 Anemia in chronic kidney disease: Secondary | ICD-10-CM | POA: Diagnosis not present

## 2015-07-18 DIAGNOSIS — N186 End stage renal disease: Secondary | ICD-10-CM | POA: Diagnosis not present

## 2015-07-18 DIAGNOSIS — E1129 Type 2 diabetes mellitus with other diabetic kidney complication: Secondary | ICD-10-CM | POA: Diagnosis not present

## 2015-07-18 DIAGNOSIS — E875 Hyperkalemia: Secondary | ICD-10-CM | POA: Diagnosis not present

## 2015-07-20 DIAGNOSIS — D509 Iron deficiency anemia, unspecified: Secondary | ICD-10-CM | POA: Diagnosis not present

## 2015-07-20 DIAGNOSIS — N2581 Secondary hyperparathyroidism of renal origin: Secondary | ICD-10-CM | POA: Diagnosis not present

## 2015-07-20 DIAGNOSIS — E875 Hyperkalemia: Secondary | ICD-10-CM | POA: Diagnosis not present

## 2015-07-20 DIAGNOSIS — N186 End stage renal disease: Secondary | ICD-10-CM | POA: Diagnosis not present

## 2015-07-20 DIAGNOSIS — E1129 Type 2 diabetes mellitus with other diabetic kidney complication: Secondary | ICD-10-CM | POA: Diagnosis not present

## 2015-07-20 DIAGNOSIS — D631 Anemia in chronic kidney disease: Secondary | ICD-10-CM | POA: Diagnosis not present

## 2015-07-21 ENCOUNTER — Encounter (HOSPITAL_COMMUNITY): Payer: Self-pay | Admitting: *Deleted

## 2015-07-21 ENCOUNTER — Emergency Department (HOSPITAL_COMMUNITY): Payer: Medicare Other

## 2015-07-21 ENCOUNTER — Inpatient Hospital Stay (HOSPITAL_COMMUNITY)
Admission: EM | Admit: 2015-07-21 | Discharge: 2015-07-23 | DRG: 391 | Disposition: A | Discharge status: Home / Self Care | Payer: Medicare Other | Attending: Internal Medicine | Admitting: Internal Medicine

## 2015-07-21 DIAGNOSIS — R911 Solitary pulmonary nodule: Secondary | ICD-10-CM | POA: Insufficient documentation

## 2015-07-21 DIAGNOSIS — Z823 Family history of stroke: Secondary | ICD-10-CM

## 2015-07-21 DIAGNOSIS — K5732 Diverticulitis of large intestine without perforation or abscess without bleeding: Secondary | ICD-10-CM | POA: Diagnosis not present

## 2015-07-21 DIAGNOSIS — D631 Anemia in chronic kidney disease: Secondary | ICD-10-CM | POA: Diagnosis present

## 2015-07-21 DIAGNOSIS — Z992 Dependence on renal dialysis: Secondary | ICD-10-CM | POA: Diagnosis not present

## 2015-07-21 DIAGNOSIS — D649 Anemia, unspecified: Secondary | ICD-10-CM | POA: Diagnosis present

## 2015-07-21 DIAGNOSIS — N186 End stage renal disease: Secondary | ICD-10-CM

## 2015-07-21 DIAGNOSIS — N2581 Secondary hyperparathyroidism of renal origin: Secondary | ICD-10-CM | POA: Diagnosis not present

## 2015-07-21 DIAGNOSIS — R51 Headache: Secondary | ICD-10-CM | POA: Diagnosis not present

## 2015-07-21 DIAGNOSIS — N189 Chronic kidney disease, unspecified: Secondary | ICD-10-CM

## 2015-07-21 DIAGNOSIS — Z888 Allergy status to other drugs, medicaments and biological substances status: Secondary | ICD-10-CM | POA: Diagnosis not present

## 2015-07-21 DIAGNOSIS — E785 Hyperlipidemia, unspecified: Secondary | ICD-10-CM | POA: Diagnosis present

## 2015-07-21 DIAGNOSIS — I12 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 stage renal disease: Secondary | ICD-10-CM | POA: Diagnosis present

## 2015-07-21 DIAGNOSIS — Z7982 Long term (current) use of aspirin: Secondary | ICD-10-CM | POA: Diagnosis not present

## 2015-07-21 DIAGNOSIS — K219 Gastro-esophageal reflux disease without esophagitis: Secondary | ICD-10-CM | POA: Diagnosis present

## 2015-07-21 DIAGNOSIS — Z79899 Other long term (current) drug therapy: Secondary | ICD-10-CM

## 2015-07-21 DIAGNOSIS — K5791 Diverticulosis of intestine, part unspecified, without perforation or abscess with bleeding: Secondary | ICD-10-CM | POA: Diagnosis not present

## 2015-07-21 DIAGNOSIS — K5792 Diverticulitis of intestine, part unspecified, without perforation or abscess without bleeding: Secondary | ICD-10-CM | POA: Diagnosis not present

## 2015-07-21 DIAGNOSIS — D638 Anemia in other chronic diseases classified elsewhere: Secondary | ICD-10-CM | POA: Diagnosis present

## 2015-07-21 DIAGNOSIS — Z885 Allergy status to narcotic agent status: Secondary | ICD-10-CM

## 2015-07-21 DIAGNOSIS — R1031 Right lower quadrant pain: Secondary | ICD-10-CM | POA: Diagnosis not present

## 2015-07-21 DIAGNOSIS — I959 Hypotension, unspecified: Secondary | ICD-10-CM | POA: Diagnosis not present

## 2015-07-21 DIAGNOSIS — E8889 Other specified metabolic disorders: Secondary | ICD-10-CM | POA: Diagnosis present

## 2015-07-21 DIAGNOSIS — Z8673 Personal history of transient ischemic attack (TIA), and cerebral infarction without residual deficits: Secondary | ICD-10-CM

## 2015-07-21 DIAGNOSIS — F17290 Nicotine dependence, other tobacco product, uncomplicated: Secondary | ICD-10-CM | POA: Diagnosis present

## 2015-07-21 DIAGNOSIS — D62 Acute posthemorrhagic anemia: Secondary | ICD-10-CM | POA: Diagnosis not present

## 2015-07-21 HISTORY — DX: Diverticulitis of intestine, part unspecified, without perforation or abscess without bleeding: K57.92

## 2015-07-21 HISTORY — DX: Other specified health status: Z78.9

## 2015-07-21 HISTORY — DX: Gastrointestinal hemorrhage, unspecified: K92.2

## 2015-07-21 LAB — CBC WITH DIFFERENTIAL/PLATELET
Basophils Absolute: 0 10*3/uL (ref 0.0–0.1)
Basophils Relative: 0 %
Eosinophils Absolute: 0.1 10*3/uL (ref 0.0–0.7)
Eosinophils Relative: 0 %
HCT: 37.3 % — ABNORMAL LOW (ref 39.0–52.0)
Hemoglobin: 11.8 g/dL — ABNORMAL LOW (ref 13.0–17.0)
Lymphocytes Relative: 7 %
Lymphs Abs: 1 10*3/uL (ref 0.7–4.0)
MCH: 28.6 pg (ref 26.0–34.0)
MCHC: 31.6 g/dL (ref 30.0–36.0)
MCV: 90.5 fL (ref 78.0–100.0)
Monocytes Absolute: 1 10*3/uL (ref 0.1–1.0)
Monocytes Relative: 7 %
Neutro Abs: 11.4 10*3/uL — ABNORMAL HIGH (ref 1.7–7.7)
Neutrophils Relative %: 85 %
Platelets: 265 10*3/uL (ref 150–400)
RBC: 4.12 MIL/uL — ABNORMAL LOW (ref 4.22–5.81)
RDW: 16.5 % — ABNORMAL HIGH (ref 11.5–15.5)
WBC: 13.5 10*3/uL — ABNORMAL HIGH (ref 4.0–10.5)

## 2015-07-21 LAB — COMPREHENSIVE METABOLIC PANEL
ALT: 12 U/L — ABNORMAL LOW (ref 17–63)
AST: 16 U/L (ref 15–41)
Albumin: 3 g/dL — ABNORMAL LOW (ref 3.5–5.0)
Alkaline Phosphatase: 78 U/L (ref 38–126)
Anion gap: 17 — ABNORMAL HIGH (ref 5–15)
BUN: 40 mg/dL — ABNORMAL HIGH (ref 6–20)
CO2: 31 mmol/L (ref 22–32)
Calcium: 8.5 mg/dL — ABNORMAL LOW (ref 8.9–10.3)
Chloride: 90 mmol/L — ABNORMAL LOW (ref 101–111)
Creatinine, Ser: 10.02 mg/dL — ABNORMAL HIGH (ref 0.61–1.24)
GFR calc Af Amer: 6 mL/min — ABNORMAL LOW (ref >60)
GFR calc non Af Amer: 5 mL/min — ABNORMAL LOW (ref >60)
Glucose, Bld: 84 mg/dL (ref 65–99)
Potassium: 5 mmol/L (ref 3.5–5.1)
Sodium: 138 mmol/L (ref 135–145)
Total Bilirubin: 0.4 mg/dL (ref 0.3–1.2)
Total Protein: 8.2 g/dL — ABNORMAL HIGH (ref 6.5–8.1)

## 2015-07-21 LAB — LIPASE, BLOOD: Lipase: 181 U/L — ABNORMAL HIGH (ref 11–51)

## 2015-07-21 MED ORDER — CINACALCET HCL 30 MG PO TABS: 90.0000 mg | ORAL_TABLET | Freq: Every day | ORAL | Status: DC

## 2015-07-21 MED ORDER — HEPARIN SODIUM (PORCINE) 5000 UNIT/ML IJ SOLN
5000.0000 [IU] | Freq: Three times a day (TID) | INTRAMUSCULAR | Status: DC
  Administered 2015-07-21 – 2015-07-23 (×6): 5000 [IU] via SUBCUTANEOUS
  Filled 2015-07-21 (×6): qty 1

## 2015-07-21 MED ORDER — CIPROFLOXACIN IN D5W 400 MG/200ML IV SOLN
400.0000 mg | INTRAVENOUS | Status: DC
  Filled 2015-07-21: qty 200

## 2015-07-21 MED ORDER — LANTHANUM CARBONATE 500 MG PO CHEW
1000.0000 mg | CHEWABLE_TABLET | Freq: Three times a day (TID) | ORAL | Status: DC
  Administered 2015-07-21 – 2015-07-23 (×4): 1000 mg via ORAL
  Filled 2015-07-21 (×4): qty 2

## 2015-07-21 MED ORDER — CALCIUM ACETATE (PHOS BINDER) 667 MG PO CAPS
2001.0000 mg | ORAL_CAPSULE | Freq: Three times a day (TID) | ORAL | Status: DC
  Administered 2015-07-21 – 2015-07-23 (×4): 2001 mg via ORAL
  Filled 2015-07-21 (×9): qty 3

## 2015-07-21 MED ORDER — HYDROMORPHONE HCL 1 MG/ML IJ SOLN
1.0000 mg | Freq: Once | INTRAMUSCULAR | Status: AC
  Administered 2015-07-21: 1 mg via INTRAVENOUS
  Filled 2015-07-21: qty 1

## 2015-07-21 MED ORDER — DARBEPOETIN ALFA 60 MCG/0.3ML IJ SOSY: 60.0000 ug | PREFILLED_SYRINGE | INTRAMUSCULAR | Status: DC

## 2015-07-21 MED ORDER — METRONIDAZOLE IN NACL 5-0.79 MG/ML-% IV SOLN
500.0000 mg | Freq: Once | INTRAVENOUS | Status: AC
  Administered 2015-07-21: 500 mg via INTRAVENOUS
  Filled 2015-07-21: qty 100

## 2015-07-21 MED ORDER — CIPROFLOXACIN IN D5W 400 MG/200ML IV SOLN
400.0000 mg | Freq: Once | INTRAVENOUS | Status: AC
  Administered 2015-07-21: 400 mg via INTRAVENOUS
  Filled 2015-07-21: qty 200

## 2015-07-21 MED ORDER — ONDANSETRON HCL 4 MG/2ML IJ SOLN
4.0000 mg | Freq: Once | INTRAMUSCULAR | Status: AC
  Administered 2015-07-21: 4 mg via INTRAVENOUS
  Filled 2015-07-21: qty 2

## 2015-07-21 MED ORDER — BOOST / RESOURCE BREEZE PO LIQD
1.0000 | Freq: Two times a day (BID) | ORAL | Status: DC
  Administered 2015-07-23 (×2): 1 via ORAL

## 2015-07-21 MED ORDER — DOXERCALCIFEROL 4 MCG/2ML IV SOLN
1.0000 ug | INTRAVENOUS | Status: DC
  Administered 2015-07-22: 1 ug via INTRAVENOUS

## 2015-07-21 MED ORDER — RENA-VITE PO TABS
1.0000 | ORAL_TABLET | Freq: Every day | ORAL | Status: DC
  Administered 2015-07-21 – 2015-07-23 (×3): 1 via ORAL
  Filled 2015-07-21 (×3): qty 1

## 2015-07-21 MED ORDER — HYDROMORPHONE HCL 1 MG/ML IJ SOLN
1.0000 mg | INTRAMUSCULAR | Status: DC | PRN
  Administered 2015-07-21 – 2015-07-23 (×8): 1 mg via INTRAVENOUS
  Filled 2015-07-21 (×9): qty 1

## 2015-07-21 MED ORDER — METRONIDAZOLE IN NACL 5-0.79 MG/ML-% IV SOLN
500.0000 mg | Freq: Three times a day (TID) | INTRAVENOUS | Status: DC
  Administered 2015-07-21 – 2015-07-23 (×6): 500 mg via INTRAVENOUS
  Filled 2015-07-21 (×8): qty 100

## 2015-07-21 MED ORDER — CINACALCET HCL 30 MG PO TABS
90.0000 mg | ORAL_TABLET | Freq: Two times a day (BID) | ORAL | Status: DC
  Administered 2015-07-21 – 2015-07-23 (×3): 90 mg via ORAL
  Filled 2015-07-21 (×3): qty 3

## 2015-07-21 MED ORDER — IOPAMIDOL (ISOVUE-300) INJECTION 61%
INTRAVENOUS | Status: AC
  Administered 2015-07-21: 100 mL
  Filled 2015-07-21: qty 100

## 2015-07-21 MED ORDER — SODIUM CHLORIDE 0.9 % IV BOLUS (SEPSIS)
1000.0000 mL | Freq: Once | INTRAVENOUS | Status: AC
  Administered 2015-07-21: 1000 mL via INTRAVENOUS

## 2015-07-21 NOTE — H&P (Signed)
Date: 07/21/2015               Patient Name:  Carl Propson Sr. MRN: [id number]  DOB: 1960/05/05 Age / Sex: 55 y.o., male   PCP: Jamal Maes, MD              Medical Service: Internal Medicine Teaching Service              Attending Physician: Dr. Sid Falcon, MD    First Contact: Garald Braver, MS 4 Pager: [phone]  Second Contact: Dr. Hulen Luster Pager: [phone]       After Hours (After 5p/  First Contact Pager: [phone]  weekends / holidays): Second Contact Pager: [phone]   Chief Complaint: RLQ Abdominal pain  History of Present Illness: [name] is a 55 year old African American male with a history of diverticulosis, diverticular hemorrhage (2006) and ESRD on HD presenting with a 1 day history of RLQ abdominal pain.  After eating a fast food hamburger, patient began having a sharp, 9/10, non-radiating pain in the RLQ of his abdomen followed by nausea and one episode of vomiting.   Pain exacerbated by movement.  No diarrhea reported.  Initially experienced chest discomfort as food "felt like it was stuck" in his chest but relieved with vomiting.  Denies fevers, sweats, chills, and diarrhea.  Reports recent increased fatigue.    Last colonoscopy in 07/2010 showed moderate diverticulosis.  Previously hospitalized last year with lower GI bleed due to diverticular hemorrhage with hemoglobin dropping as low as 6.1. Transfusion was refused as patient is a Jehovah's Witness but Hbg later stabilized. Last received hemodialysis yesterday.  On a Monday, Wednesday, Friday schedule.  Per chart quit cigarettes 20+ years ago but current cigar smoker.  In the ED patient's pain has improved slightly after 1 dose of Dilaudid 1mg . Tolerating ice chips. CT abdomen w/ contrast showed diverticulitis of the cecum and enlarged appendix.  WBC: 13.5 with absolute neutrophil count of 11.4 , Cr. 10 but consistent with baseline.  Surgery consulted out of concern for appendicitis   Patient started on  antibiotics  (Flagyl and Cipro)    Meds: Current Facility-Administered Medications  Medication Dose Route Frequency Provider Last Rate Last Dose  . heparin injection 5,000 Units  5,000 Units Subcutaneous Q8H Norman Herrlich, MD       Current Outpatient Prescriptions  Medication Sig Dispense Refill  . aspirin EC 81 MG tablet Take 1 tablet (81 mg total) by mouth daily. 90 tablet 3  . atorvastatin (LIPITOR) 10 MG tablet Take 1 tablet (10 mg total) by mouth daily at 6 PM. 30 tablet 0  . butalbital-acetaminophen-caffeine (FIORICET) 50-325-40 MG tablet Take 1 tablet by mouth every 6 (six) hours as needed for headache. 20 tablet 0  . calcium acetate (PHOSLO) 667 MG capsule Take 2,001 mg by mouth 3 (three) times daily with meals.     . cinacalcet (SENSIPAR) 90 MG tablet Take 90 mg by mouth daily.    . cromolyn (OPTICROM) 4 % ophthalmic solution 4 (four) times daily.    . folic acid-vitamin b complex-vitamin c-selenium-zinc (DIALYVITE) 3 MG TABS tablet Take 1 tablet by mouth daily.    Marland Kitchen gabapentin (NEURONTIN) 100 MG capsule Take 100 mg by mouth 2 (two) times daily.     Marland Kitchen ibuprofen (ADVIL,MOTRIN) 800 MG tablet Take 800 mg by mouth 2 (two) times daily.    Marland Kitchen lanthanum (FOSRENOL) 1000 MG chewable tablet Chew 1,000 mg by mouth 3 (three) times daily with  meals.    . Multiple Vitamin (MULTI VITAMIN MENS PO) Take 1 tablet by mouth daily.      . sorbitol 70 % solution Take 15-60 mLs by mouth daily as needed (dialysis days).       Allergies: Allergies as of 07/21/2015 - Review Complete 07/21/2015  Allergen Reaction Noted  . Infed [iron dextran] Other (See Comments) 06/19/2010  . Oxycodone Nausea Only 06/19/2010  . Vicodin [hydrocodone-acetaminophen] Other (See Comments) 06/19/2010  . Diclofenac Other (See Comments) 07/01/2014  . Whole blood Other (See Comments) 07/01/2014   Past Medical History  Diagnosis Date  . Hypertension   . Anemia   . Chronic headaches   . Sleep apnea   . Chronic kidney  disease     Chronic Renal Failure; On Renal Transplant List  . Presence of arteriovenous fistula for hemodialysis, primary ([place])     RUE  . Hemodialysis patient ([place])     M-W-F  . Stroke ([place])   . GI bleed   . Diverticulitis    Past Surgical History  Procedure Laterality Date  . Av fistula placement Right     right arm  . Graft left arm Left     for dialysis x 2  . Insertion of dialysis catheter      Rt chest   Family History  Problem Relation Age of Onset  . Diabetes Father   . Stroke Father   . Hypertension Father   . Uterine cancer Mother   . Lupus Sister   . Stroke Sister   . Anuerysm Brother     brain  . Hypertension Sister    Social History   Social History  . Marital Status: Single    Spouse Name: N/A  . Number of Children: 2  . Years of Education: N/A   Occupational History  . retired    Social History Main Topics  . Smoking status: Former Smoker    Quit date: 06/19/1990  . Smokeless tobacco: Never Used  . Alcohol Use: 0.6 oz/week    1 Cans of beer per week     Comment: occasional  . Drug Use: No  . Sexual Activity: Not on file   Other Topics Concern  . Not on file   Social History Narrative   Lives alone at home   12 years of education    2 children    Not married        Review of Systems: Constitutional: no fever, sweats or chills.  Positive for headaches and fatigue Eyes: negative for vision changes Respiratory: no dysnea Cardiovascular: no chest pain Gastrointestinal: negative for constipation or diarrhea Genitourinary: does no make urine  Skin: negative for rashes  Physical Exam: Blood pressure 118/86, pulse 98, temperature 98.9 F (37.2 C), temperature source Oral, resp. rate 26, height 6\' 1"  (1.854 m), weight 92.6 kg (204 lb 2.3 oz), SpO2 100 %. BP 118/86 mmHg  Pulse 98  Temp(Src) 98.9 F (37.2 C) (Oral)  Resp 26  Ht 6\' 1"  (1.854 m)  Wt 92.6 kg (204 lb 2.3 oz)  BMI 26.94 kg/m2  SpO2 100%  General Appearance:    Alert,  cooperative, no distress, laying in bed with head of bed elevated to 45 degrees  Head:    Normocephalic, without obvious abnormality, atraumatic  Eyes:    PERRL, EOM's intact  Nose:   Nares normal, septum midline, mucosa normal, no drainage    or sinus tenderness  Lungs:     Clear to auscultation  bilaterally, respirations unlabored  Heart:    Regular rate and rhythm, S1 and S2 normal, no murmur, rub   or gallop  Abdomen:     Soft, bowel sounds active all four quadrants,    no masses, no organomegaly, RLQ tenderness to deep palpation, no rebound tenderness or guarding, negative Rovsing sign, no  Extremities:   Extremities normal, atraumatic, no cyanosis or edema  Pulses:   2+ and symmetric all extremities  Skin:   Skin color, texture, turgor normal, no rashes or lesions  Neurologic:   CNII-XII grossly intact.     Lab results: Basic Metabolic Panel:  Recent Labs  07/21/15 0810  NA 138  K 5.0  CL 90*  CO2 31  GLUCOSE 84  BUN 40*  CREATININE 10.02*  CALCIUM 8.5*   Liver Function Tests:  Recent Labs  07/21/15 0810  AST 16  ALT 12*  ALKPHOS 78  BILITOT 0.4  PROT 8.2*  ALBUMIN 3.0*    Recent Labs  07/21/15 0810  LIPASE 181*   No results for input(s): AMMONIA in the last 72 hours. CBC:  Recent Labs  07/21/15 0810  WBC 13.5*  NEUTROABS 11.4*  HGB 11.8*  HCT 37.3*  MCV 90.5  PLT 265     Imaging results:  Ct Abdomen Pelvis W Contrast  07/21/2015  CLINICAL DATA:  Diffuse abdominal pain particularly in the right lower quadrant today EXAM: CT ABDOMEN AND PELVIS WITH CONTRAST TECHNIQUE: Multidetector CT imaging of the abdomen and pelvis was performed using the standard protocol following bolus administration of intravenous contrast. CONTRAST:  100 mL Isovue-300 COMPARISON:  09/02/2013 FINDINGS: Lower chest: Possible partially visualized pulmonary nodule right lung base on the cranial most image measuring at least 3 mm. This could alternatively represent volume  averaging with vascular anatomy. There is mild dependent atelectasis. Hepatobiliary: The liver and gallbladder are normal Pancreas: Pancreas is normal Spleen: Spleen is normal Adrenals/Urinary Tract: Adrenal glands are normal. No hydronephrosis. Innumerable bilateral renal cysts. 2 mm stone midpole left kidney. Bladder decompressed and bladder wall appears thickened. Stomach/Bowel: There is extensive diverticulosis throughout the entire large bowel particularly the cecum, descending colon, and sigmoid colon. Sigmoid wall is thickened with moderate surrounding inflammatory change. Appendix is prominent at 10 mm but the inflammatory process does not appear to centered on the appendix. Vascular/Lymphatic: No acute findings. Small right lower quadrant lymph nodes nonpathologic by size criteria Reproductive: Negative Other: Trace right pericolic gutter free fluid Musculoskeletal: No acute findings. Diffuse osteosclerosis likely related to chronic renal disease. IMPRESSION: 1. Findings most consistent with diverticulitis of the cecum. 2. Evidence of polycystic renal disease 3. Cannot exclude pulmonary nodule right lung base. CT thorax without contrast recommended. Electronically Signed   By: Skipper Cliche M.D.   On: 07/21/2015 10:19   Assessment & Plan by Problem: Principal Problem:   Diverticulitis of cecum Active Problems:   GERD (gastroesophageal reflux disease)   ESRD on dialysis (HCC)   Anemia of renal disease   HLD (hyperlipidemia)  [name] is a 55 year old African American male with a history of diverticulosis, diverticular hemorrhage (2006) and ESRD on HD shown to have diverticulitis of the cecum on CT.   Diverticulitis of Cecum: CT consistent with diverticulitis. Afebrile. WBC of 13.4. Lipase 181. Hemodynamically stable.   Initial  suspicion for appendicitis based on location of pain along with associated nausea and vomiting, leukocytosis andenlarged appendiceal diameter of 10 mm on CT.  Appendicitis and perforation less likely as patient is afebrile,  negative Rovsing's sign on exam and no signs of acute abdomen/peritoneal irritation (no rebound tenderness or guarding). Although appendix was enlarged, there were no signs of inflammatory involvement.  Surgery consulted and no was  procedure recommended.  Gastroenteritis should also be considered as symptoms began after meal but less likely as patient is afebrile and only experienced one episode of vomiting -- continue IV antibiotics (Flagyl 500mg  and Cipro 400mg ) -- PRN Dilaudid --  clear liquid diet for bowel rest -- order repeat CBC for tomorrow morning  ESRD on HD:  Cr. 10, consistent with baseline. On MWF Hemodialysis schedule. -- continue home dialysis medication: Sensipar 90mg  daily, Fosrenol 1000mg  TID, Phoslo 667mg  TID -- order Renal function panel for tomorrow morning -- will consult renal tomorrow regarding inpatient hemodialysis recommendations  Anemia secondary to ESRD:  Hbg 11.8, consistent with baseline -- continue home DIALYVITE 3mg  daily  Pulmonary Nodule:  Possible  ~3 mm pulmonary nodule on right lung base incidentally seen on Abd. CT.  Current cigar smoker, former cigarette smoker.   -- low dose CT cancer screen recommended in the outpatient setting.   Diet: Clear Liquid   PPX -- DVT: IM Heparin  Code Status: FULL  Dispo: pending clinical improvement.   This is a Careers information officer Note.  The care of the patient was discussed with Dr. Hulen Luster and the assessment and plan was formulated with their assistance.  Please see their note for official documentation of the patient encounter.   Signed: Garald Braver, Med Student 07/21/2015, 1:05 PM

## 2015-07-21 NOTE — ED Notes (Signed)
PT states feeling like his "food got stuck in my chest" yesterday and was relieved after vomiting.  Pt came today d/t diffuse abdominal pain, mostly RLQ.  States hx of GI bleed and diverticulosis, but denies black stools at present.

## 2015-07-21 NOTE — ED Notes (Signed)
Internal medicine consult at bedside.

## 2015-07-21 NOTE — H&P (Signed)
Date: 07/21/2015               Patient Name:  Carl Kaehler Sr. MRN: [id number]  DOB: 24-Dec-1960 Age / Sex: 55 y.o., male   PCP: Jamal Maes, MD         Medical Service: Internal Medicine Teaching Service         Attending Physician: Dr. Sid Falcon, MD    First Contact: MS4 Garald Braver Pager: [phone]  Second Contact: Dr. Julious Oka Pager: [phone]       After Hours (After 5p/  First Contact Pager: [phone]  weekends / holidays): Second Contact Pager: [phone]   Chief Complaint: abdominal pain  History of Present Illness: 9M w/ PMHx of diverticulitis and ESRD on HD who presents w/ RLQ pain that started last night after eating at [place]. He had RLQ pain that he rates at 9/10 in severity and is non radiating. Movement worsens pain, he has not tried any medication to help w/ pain at home. After eating he felt that food was stuck in his chest and he vomited. He then went to sleep and woke up with pain around 6:00am this morning and thus decided to go to the ED. He denies fevers, NS, chills, rashes, and hematochezia. He was able to tolerate ice chips in the ED. A CT abd/pelvis revealed extensive diverticulosis throughout the entire large bowel particularly in the cecum, descending colon, and sigmoid colon. There was a prominent 15mm appendix but the inflammatory process did not appear to be centered on the appendix. Surgery was consulted and recommend observation in case of appendicitis.   Pt had a colonoscopy in 07/18/2010 at which time he was noted to have moderate diverticulosis sigmoid to ascending colon. He was last admitted in May 2016 for a GI bleed thought to be secondary to diverticular hemorrhage. His hgb was 6.1 and he refused blood transfusion due to religious beliefs but was open to IV iron.     Meds: No current facility-administered medications for this encounter.   Current Outpatient Prescriptions  Medication Sig Dispense Refill  . aspirin EC 81 MG tablet Take  1 tablet (81 mg total) by mouth daily. 90 tablet 3  . atorvastatin (LIPITOR) 10 MG tablet Take 1 tablet (10 mg total) by mouth daily at 6 PM. 30 tablet 0  . butalbital-acetaminophen-caffeine (FIORICET) 50-325-40 MG tablet Take 1 tablet by mouth every 6 (six) hours as needed for headache. 20 tablet 0  . calcium acetate (PHOSLO) 667 MG capsule Take 2,001 mg by mouth 3 (three) times daily with meals.     . cinacalcet (SENSIPAR) 90 MG tablet Take 90 mg by mouth daily.    . cromolyn (OPTICROM) 4 % ophthalmic solution 4 (four) times daily.    . folic acid-vitamin b complex-vitamin c-selenium-zinc (DIALYVITE) 3 MG TABS tablet Take 1 tablet by mouth daily.    Marland Kitchen gabapentin (NEURONTIN) 100 MG capsule Take 100 mg by mouth 2 (two) times daily.     Marland Kitchen ibuprofen (ADVIL,MOTRIN) 800 MG tablet Take 800 mg by mouth 2 (two) times daily.    Marland Kitchen lanthanum (FOSRENOL) 1000 MG chewable tablet Chew 1,000 mg by mouth 3 (three) times daily with meals.    . Multiple Vitamin (MULTI VITAMIN MENS PO) Take 1 tablet by mouth daily.      . sorbitol 70 % solution Take 15-60 mLs by mouth daily as needed (dialysis days).       Allergies: Allergies as of 07/21/2015 - Review Complete 07/21/2015  Allergen Reaction Noted  . Infed [iron dextran] Other (See Comments) 06/19/2010  . Oxycodone Nausea Only 06/19/2010  . Vicodin [hydrocodone-acetaminophen] Other (See Comments) 06/19/2010  . Diclofenac Other (See Comments) 07/01/2014  . Whole blood Other (See Comments) 07/01/2014   Past Medical History  Diagnosis Date  . Hypertension   . Anemia   . Chronic headaches   . Sleep apnea   . Chronic kidney disease     Chronic Renal Failure; On Renal Transplant List  . Presence of arteriovenous fistula for hemodialysis, primary ([place])     RUE  . Hemodialysis patient ([place])     M-W-F  . Stroke ([place])   . GI bleed   . Diverticulitis    Past Surgical History  Procedure Laterality Date  . Av fistula placement Right     right arm  . Graft  left arm Left     for dialysis x 2  . Insertion of dialysis catheter      Rt chest   Family History  Problem Relation Age of Onset  . Diabetes Father   . Stroke Father   . Hypertension Father   . Uterine cancer Mother   . Lupus Sister   . Stroke Sister   . Anuerysm Brother     brain  . Hypertension Sister    Social History   Social History  . Marital Status: Single    Spouse Name: N/A  . Number of Children: 2  . Years of Education: N/A   Occupational History  . retired    Social History Main Topics  . Smoking status: Former Smoker    Quit date: 06/19/1990  . Smokeless tobacco: Never Used  . Alcohol Use: 0.6 oz/week    1 Cans of beer per week     Comment: occasional  . Drug Use: No  . Sexual Activity: Not on file   Other Topics Concern  . Not on file   Social History Narrative   Lives alone at home   12 years of education    2 children    Not married        Review of Systems: Review of Systems  Constitutional: Negative for fever, chills and weight loss.  Cardiovascular: Negative for chest pain.  Gastrointestinal: Positive for nausea, vomiting (1 episode last night) and abdominal pain. Negative for diarrhea, constipation and blood in stool.  Skin: Negative for rash.    Physical Exam: Blood pressure 115/80, pulse 90, temperature 98.9 F (37.2 C), temperature source Oral, resp. rate 19, height 6\' 1"  (1.854 m), weight 204 lb 2.3 oz (92.6 kg), SpO2 96 %. Physical Exam  Constitutional: He appears well-developed and well-nourished. No distress.  HENT:  Head: Normocephalic and atraumatic.  Nose: Nose normal.  Eyes: Conjunctivae and EOM are normal. No scleral icterus.  Cardiovascular: Normal rate, regular rhythm and normal heart sounds.  Exam reveals no gallop and no friction rub.   No murmur heard. Pulmonary/Chest: Effort normal and breath sounds normal. No respiratory distress (RLQ TTP, no rebound or guarding). He has no wheezes. He has no rales.    Abdominal: Soft. Bowel sounds are normal. He exhibits no distension and no mass. There is tenderness. There is no rebound and no guarding.  Musculoskeletal: He exhibits no edema.  Skin: Skin is warm and dry. No rash noted. He is not diaphoretic. No erythema. No pallor.     Lab results: Basic Metabolic Panel:  Recent Labs  07/21/15 0810  NA 138  K 5.0  CL 90*  CO2 31  GLUCOSE 84  BUN 40*  CREATININE 10.02*  CALCIUM 8.5*   Liver Function Tests:  Recent Labs  07/21/15 0810  AST 16  ALT 12*  ALKPHOS 78  BILITOT 0.4  PROT 8.2*  ALBUMIN 3.0*    Recent Labs  07/21/15 0810  LIPASE 181*   CBC:  Recent Labs  07/21/15 0810  WBC 13.5*  NEUTROABS 11.4*  HGB 11.8*  HCT 37.3*  MCV 90.5  PLT 265    Imaging results:  Ct Abdomen Pelvis W Contrast  07/21/2015  CLINICAL DATA:  Diffuse abdominal pain particularly in the right lower quadrant today EXAM: CT ABDOMEN AND PELVIS WITH CONTRAST TECHNIQUE: Multidetector CT imaging of the abdomen and pelvis was performed using the standard protocol following bolus administration of intravenous contrast. CONTRAST:  100 mL Isovue-300 COMPARISON:  09/02/2013 FINDINGS: Lower chest: Possible partially visualized pulmonary nodule right lung base on the cranial most image measuring at least 3 mm. This could alternatively represent volume averaging with vascular anatomy. There is mild dependent atelectasis. Hepatobiliary: The liver and gallbladder are normal Pancreas: Pancreas is normal Spleen: Spleen is normal Adrenals/Urinary Tract: Adrenal glands are normal. No hydronephrosis. Innumerable bilateral renal cysts. 2 mm stone midpole left kidney. Bladder decompressed and bladder wall appears thickened. Stomach/Bowel: There is extensive diverticulosis throughout the entire large bowel particularly the cecum, descending colon, and sigmoid colon. Sigmoid wall is thickened with moderate surrounding inflammatory change. Appendix is prominent at 10 mm but  the inflammatory process does not appear to centered on the appendix. Vascular/Lymphatic: No acute findings. Small right lower quadrant lymph nodes nonpathologic by size criteria Reproductive: Negative Other: Trace right pericolic gutter free fluid Musculoskeletal: No acute findings. Diffuse osteosclerosis likely related to chronic renal disease. IMPRESSION: 1. Findings most consistent with diverticulitis of the cecum. 2. Evidence of polycystic renal disease 3. Cannot exclude pulmonary nodule right lung base. CT thorax without contrast recommended. Electronically Signed   By: Skipper Cliche M.D.   On: 07/21/2015 10:19      Assessment & Plan by Problem: Principal Problem:   Diverticulitis of cecum Active Problems:   GERD (gastroesophageal reflux disease)   ESRD on dialysis (HCC)   Anemia of renal disease   HLD (hyperlipidemia)  1. Diverticulitis of cecum-- noted on CT of abd/pevis in the ED. He has an elevated white blood cell count and lipase of 181. Surgery was consulted for possible appendicitis as it was enlarged and recommended observation. He was started on cipro and flagyl in the ED. Abdominal exam was + for RLQ TTP w/o guarding or rebound tenderness, no signs of an acute abdomen. VSS. - admit to med surg - continue IV cipro and flagyl - clear liquid diet - IV dilaudid 1mg  q4h prn for pain - 1L NS bolus   2. ESRD MWF HD--last HD session yesterday. Cr 10.02 on admission, K nl. Renal function panel ordered for tomorrow morning, will consult nephro tomorrow pending progression of patients diverticulitis.  - continue home renal meds  3. Pulmonary nodule-- rt lung base nodule measuring at least 39mm noted CT abd/pelvis which could alternatively represent volume averaging with vascular anatomy. He is a former cigarette smoker but quit in 1992, he currently smokes about 1-2 cigars a weeks and age >5. Denies any dyspnea. He weighed 217lbs 3 months ago and today weights 204. Will recommend a  CT thorax without contrast on discharge. If nodule less than 24mm he would likely need a repeat CT chest in  12 months due to high pretest probability for pulmonary malignancy.  - rechecking weight to ensure accuracy  DVT ppx-- heparin   Dispo: Disposition is deferred at this time, awaiting improvement of current medical problems.   The patient does not have a current PCP Jamal Maes, MD) and does need an [facility] hospital follow-up appointment after discharge.  The patient does not have transportation limitations that hinder transportation to clinic appointments.  Signed: Norman Herrlich, MD 07/21/2015, 12:40 PM

## 2015-07-21 NOTE — ED Notes (Signed)
Pt states he is on dialysis and does not make urine.

## 2015-07-21 NOTE — Consult Note (Signed)
Reason for Consult:  Cecal diverticulitis  Referring Physician:   Dr. Calla Kicks GI:  [facility] - colonoscopy 07/2010 with moderate diverticulosis   Carl Cajamarca Sr. is an 55 y.o. male with ESRD, on HD HPI: Pt reports getting nauseated and vomiting a large amount yesterday after lunch.  This was followed by pain RLQ that has persisted since it started yesterday afternoon.  He has not wanted to eat or drink much since onset of pain.  He had dialysis yesterday.  Work up in the ED shows WBC of 13.5, H/H is OK.  K+ 5.0, creatinine is 10, lipase is also elevated some.  CT scan shows diverticulitis of the cecum, the appendix is 10 mm but does not appear involved.  We are ask to see.    Past Medical History  Diagnosis Date  . Hypertension   . Anemia   . Chronic headaches   . Sleep apnea   . Chronic kidney disease     Chronic Renal Failure; On Renal Transplant List  . Presence of arteriovenous fistula for hemodialysis, primary ([place])     RUE  . Hemodialysis patient ([place])     M-W-F  . Stroke ([place])   . GI bleed  07/2014   . Diverticulitis     Past Surgical History  Procedure Laterality Date  . Av fistula placement Right     right arm  . Graft left arm Left     for dialysis x 2  . Insertion of dialysis catheter      Rt chest    Family History  Problem Relation Age of Onset  . Diabetes Father   . Stroke Father   . Hypertension Father   . Uterine cancer Mother   . Lupus Sister   . Stroke Sister   . Anuerysm Brother     brain  . Hypertension Sister     Social History:  reports that he quit smoking about 25 years ago. He has never used smokeless tobacco. He reports that he drinks about 0.6 oz of alcohol per week. He reports that he does not use illicit drugs. Tobacco:  None Drugs:  None Etoh:  Social  Allergies:  Allergies  Allergen Reactions  . Infed [Iron Dextran] Other (See Comments)    Decreased BP   . Oxycodone Nausea Only  . Vicodin [Hydrocodone-Acetaminophen] Other  (See Comments)    Decreased BP  . Diclofenac Other (See Comments)    Unknown  . Whole Blood Other (See Comments)    Unk    Prior to Admission medications   Medication Sig Start Date End Date Taking? Authorizing Provider  aspirin EC 81 MG tablet Take 1 tablet (81 mg total) by mouth daily. 08/26/14  Yes Rosalin Hawking, MD  atorvastatin (LIPITOR) 10 MG tablet Take 1 tablet (10 mg total) by mouth daily at 6 PM. 05/25/14  Yes Thurnell Lose, MD  butalbital-acetaminophen-caffeine (FIORICET) 50-325-40 MG tablet Take 1 tablet by mouth every 6 (six) hours as needed for headache. 02/05/15 02/05/16 Yes Jeffrey Hedges, PA-C  calcium acetate (PHOSLO) 667 MG capsule Take 2,001 mg by mouth 3 (three) times daily with meals.    Yes Historical Provider, MD  cinacalcet (SENSIPAR) 90 MG tablet Take 90 mg by mouth daily.   Yes Historical Provider, MD  cromolyn (OPTICROM) 4 % ophthalmic solution 4 (four) times daily.   Yes Historical Provider, MD  folic acid-vitamin b complex-vitamin c-selenium-zinc (DIALYVITE) 3 MG TABS tablet Take 1 tablet by mouth daily.  Yes Historical Provider, MD  gabapentin (NEURONTIN) 100 MG capsule Take 100 mg by mouth 2 (two) times daily.  09/29/14  Yes Historical Provider, MD  ibuprofen (ADVIL,MOTRIN) 800 MG tablet Take 800 mg by mouth 2 (two) times daily.   Yes Historical Provider, MD  lanthanum (FOSRENOL) 1000 MG chewable tablet Chew 1,000 mg by mouth 3 (three) times daily with meals.   Yes Historical Provider, MD  Multiple Vitamin (MULTI VITAMIN MENS PO) Take 1 tablet by mouth daily.     Yes Historical Provider, MD  sorbitol 70 % solution Take 15-60 mLs by mouth daily as needed (dialysis days).    Yes Historical Provider, MD     Results for orders placed or performed during the hospital encounter of 07/21/15 (from the past 48 hour(s))  Lipase, blood     Status: Abnormal   Collection Time: 07/21/15  8:10 AM  Result Value Ref Range   Lipase 181 (H) 11 - 51 U/L  Comprehensive  metabolic panel     Status: Abnormal   Collection Time: 07/21/15  8:10 AM  Result Value Ref Range   Sodium 138 135 - 145 mmol/L   Potassium 5.0 3.5 - 5.1 mmol/L   Chloride 90 (L) 101 - 111 mmol/L   CO2 31 22 - 32 mmol/L   Glucose, Bld 84 65 - 99 mg/dL   BUN 40 (H) 6 - 20 mg/dL   Creatinine, Ser 10.02 (H) 0.61 - 1.24 mg/dL   Calcium 8.5 (L) 8.9 - 10.3 mg/dL   Total Protein 8.2 (H) 6.5 - 8.1 g/dL   Albumin 3.0 (L) 3.5 - 5.0 g/dL   AST 16 15 - 41 U/L   ALT 12 (L) 17 - 63 U/L   Alkaline Phosphatase 78 38 - 126 U/L   Total Bilirubin 0.4 0.3 - 1.2 mg/dL   GFR calc non Af Amer 5 (L) >60 mL/min   GFR calc Af Amer 6 (L) >60 mL/min    Comment: (NOTE) The eGFR has been calculated using the CKD EPI equation. This calculation has not been validated in all clinical situations. eGFR's persistently <60 mL/min signify possible Chronic Kidney Disease.    Anion gap 17 (H) 5 - 15  CBC with Differential/Platelet     Status: Abnormal   Collection Time: 07/21/15  8:10 AM  Result Value Ref Range   WBC 13.5 (H) 4.0 - 10.5 K/uL   RBC 4.12 (L) 4.22 - 5.81 MIL/uL   Hemoglobin 11.8 (L) 13.0 - 17.0 g/dL   HCT 37.3 (L) 39.0 - 52.0 %   MCV 90.5 78.0 - 100.0 fL   MCH 28.6 26.0 - 34.0 pg   MCHC 31.6 30.0 - 36.0 g/dL   RDW 16.5 (H) 11.5 - 15.5 %   Platelets 265 150 - 400 K/uL   Neutrophils Relative % 85 %   Neutro Abs 11.4 (H) 1.7 - 7.7 K/uL   Lymphocytes Relative 7 %   Lymphs Abs 1.0 0.7 - 4.0 K/uL   Monocytes Relative 7 %   Monocytes Absolute 1.0 0.1 - 1.0 K/uL   Eosinophils Relative 0 %   Eosinophils Absolute 0.1 0.0 - 0.7 K/uL   Basophils Relative 0 %   Basophils Absolute 0.0 0.0 - 0.1 K/uL    Ct Abdomen Pelvis W Contrast  07/21/2015  CLINICAL DATA:  Diffuse abdominal pain particularly in the right lower quadrant today EXAM: CT ABDOMEN AND PELVIS WITH CONTRAST TECHNIQUE: Multidetector CT imaging of the abdomen and pelvis was performed using the  standard protocol following bolus administration of  intravenous contrast. CONTRAST:  100 mL Isovue-300 COMPARISON:  09/02/2013 FINDINGS: Lower chest: Possible partially visualized pulmonary nodule right lung base on the cranial most image measuring at least 3 mm. This could alternatively represent volume averaging with vascular anatomy. There is mild dependent atelectasis. Hepatobiliary: The liver and gallbladder are normal Pancreas: Pancreas is normal Spleen: Spleen is normal Adrenals/Urinary Tract: Adrenal glands are normal. No hydronephrosis. Innumerable bilateral renal cysts. 2 mm stone midpole left kidney. Bladder decompressed and bladder wall appears thickened. Stomach/Bowel: There is extensive diverticulosis throughout the entire large bowel particularly the cecum, descending colon, and sigmoid colon. Sigmoid wall is thickened with moderate surrounding inflammatory change. Appendix is prominent at 10 mm but the inflammatory process does not appear to centered on the appendix. Vascular/Lymphatic: No acute findings. Small right lower quadrant lymph nodes nonpathologic by size criteria Reproductive: Negative Other: Trace right pericolic gutter free fluid Musculoskeletal: No acute findings. Diffuse osteosclerosis likely related to chronic renal disease. IMPRESSION: 1. Findings most consistent with diverticulitis of the cecum. 2. Evidence of polycystic renal disease 3. Cannot exclude pulmonary nodule right lung base. CT thorax without contrast recommended. Electronically Signed   By: Skipper Cliche M.D.   On: 07/21/2015 10:19    Review of Systems  Constitutional: Negative.   HENT: Negative.        Recent bilateral cataract extractions.    Eyes: Negative.   Respiratory: Negative.   Cardiovascular: Negative.   Gastrointestinal: Positive for heartburn (occasional ), nausea, vomiting and abdominal pain (RLQ). Negative for diarrhea, constipation, blood in stool and melena.  Genitourinary: Negative.        He is not making any urine  Musculoskeletal:  Negative.   Skin: Negative.   Neurological: Negative.   Endo/Heme/Allergies: Negative.   Psychiatric/Behavioral: Negative.    Blood pressure 119/83, pulse 92, temperature 98.9 F (37.2 C), temperature source Oral, resp. rate 16, height _0  (1.854 m), weight 92.6 kg (204 lb 2.3 oz), SpO2 99 %. Physical Exam  Constitutional: He is oriented to person, place, and time. He appears well-developed and well-nourished. No distress.  HENT:  Head: Normocephalic.  Nose: Nose normal.  Eyes: Right eye exhibits no discharge. Left eye exhibits no discharge. No scleral icterus.  Neck: Normal range of motion. Neck supple. No JVD present. No tracheal deviation present. No thyromegaly present.  Cardiovascular: Normal rate, regular rhythm, normal heart sounds and intact distal pulses.   No murmur heard. Respiratory: Effort normal and breath sounds normal. No respiratory distress. He has no wheezes. He has no rales. He exhibits no tenderness.  GI: Soft. He exhibits no distension and no mass. There is tenderness (tender RLQ). There is no rebound and no guarding.  Musculoskeletal: He exhibits no edema.  Lymphadenopathy:    He has no cervical adenopathy.  Neurological: He is alert and oriented to person, place, and time. No cranial nerve deficit.  Skin: Skin is warm and dry. No rash noted. He is not diaphoretic. No erythema. No pallor.  Psychiatric: He has a normal mood and affect. His behavior is normal. Judgment and thought content normal.    Assessment/Plan: Diverticulitis of the cecum ESRD on HD Hypertension Anemia of chronic disease Sleep apnea - CPAP sometimes complaint Hx of GI bleed 07/2014 Stroke 2016 Chronic headaches - he says it occurs when he needs HD  Plan:  Agree with antibiotics, ice chips and limited clears.  We will follow with you.    [name] 07/21/2015, 11:38 AM

## 2015-07-21 NOTE — ED Provider Notes (Signed)
CSN: [id number]     Arrival date & time 07/21/15  [id number] History   First MD Initiated Contact with Patient 07/21/15 [phone]     Chief Complaint  Patient presents with  . Abdominal Pain     (Consider location/radiation/quality/duration/timing/severity/associated sxs/prior Treatment) HPI Comments: Patient presents to the emergency department with chief complaint of right lower quadrant abdominal pain. Patient states that pain started yesterday and has progressively worsened until now. He reports associated nausea and one episode of vomiting yesterday. He denies any associated fevers or chills. Denies any dysuria. He states that he has dialysis patient, and is on a MWF dialysis schedule. He was last dialyzed yesterday, and completed his dialysis session. He denies any prior abdominal surgeries. He is concerned about his appendix. His symptoms are worsened with palpation and movement. He rates his pain is 9 out of 10. He has not taken anything for his pain. Additionally, he reports a history of GI bleed and diverticulitis, but states that this feels different, and denies any black or bloody stools.  Patient is a 55 y.o. male presenting with abdominal pain. The history is provided by the patient. No language interpreter was used.  Abdominal Pain Associated symptoms: nausea and vomiting     Past Medical History  Diagnosis Date  . Hypertension   . Anemia   . Chronic headaches   . Sleep apnea   . Chronic kidney disease     Chronic Renal Failure; On Renal Transplant List  . Presence of arteriovenous fistula for hemodialysis, primary ([place])     RUE  . Hemodialysis patient ([place])     M-W-F  . Stroke ([place])   . GI bleed   . Diverticulitis    Past Surgical History  Procedure Laterality Date  . Av fistula placement Right     right arm  . Graft left arm Left     for dialysis x 2  . Insertion of dialysis catheter      Rt chest   Family History  Problem Relation Age of Onset  . Diabetes Father   .  Stroke Father   . Hypertension Father   . Uterine cancer Mother   . Lupus Sister   . Stroke Sister   . Anuerysm Brother     brain  . Hypertension Sister    Social History  Substance Use Topics  . Smoking status: Former Smoker    Quit date: 06/19/1990  . Smokeless tobacco: Never Used  . Alcohol Use: 0.6 oz/week    1 Cans of beer per week     Comment: occasional    Review of Systems  Gastrointestinal: Positive for nausea, vomiting and abdominal pain.  All other systems reviewed and are negative.     Allergies  Infed; Oxycodone; Vicodin; Diclofenac; and Whole blood  Home Medications   Prior to Admission medications   Medication Sig Start Date End Date Taking? Authorizing Provider  aspirin EC 81 MG tablet Take 1 tablet (81 mg total) by mouth daily. 08/26/14   Rosalin Hawking, MD  atorvastatin (LIPITOR) 10 MG tablet Take 1 tablet (10 mg total) by mouth daily at 6 PM. 05/25/14   Thurnell Lose, MD  butalbital-acetaminophen-caffeine (FIORICET) 50-325-40 MG tablet Take 1 tablet by mouth every 6 (six) hours as needed for headache. 02/05/15 02/05/16  Okey Regal, PA-C  calcium acetate (PHOSLO) 667 MG capsule Take 2,001 mg by mouth 3 (three) times daily with meals.     Historical Provider, MD  cromolyn (OPTICROM) 4 %  ophthalmic solution 4 (four) times daily.    Historical Provider, MD  folic acid-vitamin b complex-vitamin c-selenium-zinc (DIALYVITE) 3 MG TABS tablet Take 1 tablet by mouth daily.    Historical Provider, MD  gabapentin (NEURONTIN) 100 MG capsule Take 100 mg by mouth 2 (two) times daily.  09/29/14   Historical Provider, MD  ibuprofen (ADVIL,MOTRIN) 800 MG tablet Take 800 mg by mouth 2 (two) times daily.    Historical Provider, MD  lanthanum (FOSRENOL) 1000 MG chewable tablet Chew 1,000 mg by mouth 3 (three) times daily with meals.    Historical Provider, MD  Multiple Vitamin (MULTI VITAMIN MENS PO) Take 1 tablet by mouth daily.      Historical Provider, MD  sorbitol 70 %  solution Take 15-60 mLs by mouth daily as needed (dialysis days).     Historical Provider, MD   BP 112/77 mmHg  Pulse 98  Temp(Src) 98.9 F (37.2 C) (Oral)  Resp 16  Ht 6\' 1"  (1.854 m)  Wt 92.6 kg  BMI 26.94 kg/m2  SpO2 97% Physical Exam  Constitutional: He is oriented to person, place, and time. He appears well-developed and well-nourished.  HENT:  Head: Normocephalic and atraumatic.  Eyes: Conjunctivae and EOM are normal. Pupils are equal, round, and reactive to light. Right eye exhibits no discharge. Left eye exhibits no discharge. No scleral icterus.  Neck: Normal range of motion. Neck supple. No JVD present.  Cardiovascular: Normal rate, regular rhythm and normal heart sounds.  Exam reveals no gallop and no friction rub.   No murmur heard. Pulmonary/Chest: Effort normal and breath sounds normal. No respiratory distress. He has no wheezes. He has no rales. He exhibits no tenderness.  Abdominal: Soft. He exhibits no distension and no mass. There is tenderness. There is no rebound and no guarding.  Significant right lower quadrant abdominal tenderness with some right upper quadrant abdominal tenderness, no other focal abdominal tenderness  Musculoskeletal: Normal range of motion. He exhibits no edema or tenderness.  Neurological: He is alert and oriented to person, place, and time.  Skin: Skin is warm and dry.  Psychiatric: He has a normal mood and affect. His behavior is normal. Judgment and thought content normal.  Nursing note and vitals reviewed.   ED Course  Procedures (including critical care time) Results for orders placed or performed during the hospital encounter of 07/21/15  Lipase, blood  Result Value Ref Range   Lipase 181 (H) 11 - 51 U/L  Comprehensive metabolic panel  Result Value Ref Range   Sodium 138 135 - 145 mmol/L   Potassium 5.0 3.5 - 5.1 mmol/L   Chloride 90 (L) 101 - 111 mmol/L   CO2 31 22 - 32 mmol/L   Glucose, Bld 84 65 - 99 mg/dL   BUN 40 (H) 6 -  20 mg/dL   Creatinine, Ser 10.02 (H) 0.61 - 1.24 mg/dL   Calcium 8.5 (L) 8.9 - 10.3 mg/dL   Total Protein 8.2 (H) 6.5 - 8.1 g/dL   Albumin 3.0 (L) 3.5 - 5.0 g/dL   AST 16 15 - 41 U/L   ALT 12 (L) 17 - 63 U/L   Alkaline Phosphatase 78 38 - 126 U/L   Total Bilirubin 0.4 0.3 - 1.2 mg/dL   GFR calc non Af Amer 5 (L) >60 mL/min   GFR calc Af Amer 6 (L) >60 mL/min   Anion gap 17 (H) 5 - 15  CBC with Differential/Platelet  Result Value Ref Range   WBC 13.5 (  H) 4.0 - 10.5 K/uL   RBC 4.12 (L) 4.22 - 5.81 MIL/uL   Hemoglobin 11.8 (L) 13.0 - 17.0 g/dL   HCT 37.3 (L) 39.0 - 52.0 %   MCV 90.5 78.0 - 100.0 fL   MCH 28.6 26.0 - 34.0 pg   MCHC 31.6 30.0 - 36.0 g/dL   RDW 16.5 (H) 11.5 - 15.5 %   Platelets 265 150 - 400 K/uL   Neutrophils Relative % 85 %   Neutro Abs 11.4 (H) 1.7 - 7.7 K/uL   Lymphocytes Relative 7 %   Lymphs Abs 1.0 0.7 - 4.0 K/uL   Monocytes Relative 7 %   Monocytes Absolute 1.0 0.1 - 1.0 K/uL   Eosinophils Relative 0 %   Eosinophils Absolute 0.1 0.0 - 0.7 K/uL   Basophils Relative 0 %   Basophils Absolute 0.0 0.0 - 0.1 K/uL   Ct Abdomen Pelvis W Contrast  07/21/2015  CLINICAL DATA:  Diffuse abdominal pain particularly in the right lower quadrant today EXAM: CT ABDOMEN AND PELVIS WITH CONTRAST TECHNIQUE: Multidetector CT imaging of the abdomen and pelvis was performed using the standard protocol following bolus administration of intravenous contrast. CONTRAST:  100 mL Isovue-300 COMPARISON:  09/02/2013 FINDINGS: Lower chest: Possible partially visualized pulmonary nodule right lung base on the cranial most image measuring at least 3 mm. This could alternatively represent volume averaging with vascular anatomy. There is mild dependent atelectasis. Hepatobiliary: The liver and gallbladder are normal Pancreas: Pancreas is normal Spleen: Spleen is normal Adrenals/Urinary Tract: Adrenal glands are normal. No hydronephrosis. Innumerable bilateral renal cysts. 2 mm stone midpole left  kidney. Bladder decompressed and bladder wall appears thickened. Stomach/Bowel: There is extensive diverticulosis throughout the entire large bowel particularly the cecum, descending colon, and sigmoid colon. Sigmoid wall is thickened with moderate surrounding inflammatory change. Appendix is prominent at 10 mm but the inflammatory process does not appear to centered on the appendix. Vascular/Lymphatic: No acute findings. Small right lower quadrant lymph nodes nonpathologic by size criteria Reproductive: Negative Other: Trace right pericolic gutter free fluid Musculoskeletal: No acute findings. Diffuse osteosclerosis likely related to chronic renal disease. IMPRESSION: 1. Findings most consistent with diverticulitis of the cecum. 2. Evidence of polycystic renal disease 3. Cannot exclude pulmonary nodule right lung base. CT thorax without contrast recommended. Electronically Signed   By: Skipper Cliche M.D.   On: 07/21/2015 10:19    I have personally reviewed and evaluated these images and lab results as part of my medical decision-making.    MDM   Final diagnoses:  Diverticulitis of large intestine without perforation or abscess without bleeding  Right lower quadrant pain    Patient with severe right lower quadrant abdominal pain. Started yesterday and has progressively worsened until now. Will check CT abdomen/pelvis to rule out appendicitis.   CT scan shows diverticulitis of the cecum, but does show prominence of the appendix. I discussed this finding with Dr. Johnney Killian, who recommends consultation with general surgery.  Appreciate consultation from general surgery, who recommends observation admission by medicine, general surgery will follow along.  Appreciate internal medicine resident service for admitting the patient for observation.    Montine Circle, PA-C 07/21/15 [place], MD 07/31/15 [phone]

## 2015-07-21 NOTE — ED Notes (Signed)
Patient transported to CT 

## 2015-07-21 NOTE — Consult Note (Signed)
[facility] Renal Consultation Note    Indication for Consultation:  Management of ESRD/hemodialysis; anemia, hypertension/volume and secondary hyperparathyroidism PCP:  HPI: Carl Kawecki Sr. is a 55 y.o. 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 Erlinda Hong.  He  had been doing quite well until yesterday after he had a hamburger from [place].  He felt like it hung up in his chest and eventually vomited it up and though he would be better but then started having abdominal pain. He denies fever, chills, SOB or other pain than in stomach.  He had prior diverticulitis so he drove himself to the ED for evaluation. Evaluation in the ED showed WBC 13.5 K 5 BUN 40 Cr 10 Hgb 11.8.  He did not dialyze his full treatment Wednesday and is prone to having elevated K in upper 5 to low 6 range. He often has shortened treatments.  Abdominal CT today showed diverticulitis of the cecum and ? Pulmonary nodule of right lung base w/ CT chest recommended.  He hasn't eaten much since the onset of abdominal pain. He will be scheduled for HD tomorrow  Past Medical History  Diagnosis Date  . Hypertension   . Anemia   . Chronic headaches   . Sleep apnea   . Chronic kidney disease     Chronic Renal Failure; On Renal Transplant List  . Presence of arteriovenous fistula for hemodialysis, primary ([place])     RUE  . Hemodialysis patient ([place])     M-W-F  . Stroke ([place])   . GI bleed   . Diverticulitis    Past Surgical History  Procedure Laterality Date  . Av fistula placement Right     right arm  . Graft left arm Left     for dialysis x 2  . Insertion of dialysis catheter      Rt chest   Family History  Problem Relation Age of Onset  . Diabetes Father   . Stroke Father   . Hypertension  Father   . Uterine cancer Mother   . Lupus Sister   . Stroke Sister   . Anuerysm Brother     brain  . Hypertension Sister    Social History:  reports that he quit smoking about 25 years ago. He has never used smokeless tobacco. He reports that he drinks about 0.6 oz of alcohol per week. He reports that he does not use illicit drugs. Allergies  Allergen Reactions  . Infed [Iron Dextran] Other (See Comments)    Decreased BP   . Oxycodone Nausea Only  . Vicodin [Hydrocodone-Acetaminophen] Other (See Comments)    Decreased BP  . Diclofenac Other (See Comments)    Unknown  . Whole Blood Other (See Comments)    Unk   Prior to Admission medications   Medication Sig Start Date End Date Taking? Authorizing Provider  aspirin EC 81 MG tablet Take 1 tablet (81 mg total) by mouth daily. 08/26/14  Yes Rosalin Hawking, MD  atorvastatin (LIPITOR) 10 MG tablet Take 1 tablet (10 mg total) by mouth daily at 6 PM. 05/25/14  Yes Thurnell Lose, MD  butalbital-acetaminophen-caffeine (FIORICET) 50-325-40 MG tablet Take 1 tablet by mouth every 6 (six) hours as needed for headache. 02/05/15 02/05/16 Yes Okey Regal,  PA-C  calcium acetate (PHOSLO) 667 MG capsule Take 2,001 mg by mouth 3 (three) times daily with meals.    Yes Historical Provider, MD  cinacalcet (SENSIPAR) 90 MG tablet Take 90 mg by mouth daily.   Yes Historical Provider, MD  cromolyn (OPTICROM) 4 % ophthalmic solution 4 (four) times daily.   Yes Historical Provider, MD  folic acid-vitamin b complex-vitamin c-selenium-zinc (DIALYVITE) 3 MG TABS tablet Take 1 tablet by mouth daily.   Yes Historical Provider, MD  gabapentin (NEURONTIN) 100 MG capsule Take 100 mg by mouth 2 (two) times daily.  09/29/14  Yes Historical Provider, MD  ibuprofen (ADVIL,MOTRIN) 800 MG tablet Take 800 mg by mouth 2 (two) times daily.   Yes Historical Provider, MD  lanthanum (FOSRENOL) 1000 MG chewable tablet Chew 1,000 mg by mouth 3 (three) times daily with meals.   Yes  Historical Provider, MD  Multiple Vitamin (MULTI VITAMIN MENS PO) Take 1 tablet by mouth daily.     Yes Historical Provider, MD  sorbitol 70 % solution Take 15-60 mLs by mouth daily as needed (dialysis days).    Yes Historical Provider, MD   Current Facility-Administered Medications  Medication Dose Route Frequency Provider Last Rate Last Dose  . [START ON 07/22/2015] cinacalcet (SENSIPAR) tablet 90 mg  90 mg Oral Q breakfast Norman Herrlich, MD      . Derrill Memo ON 07/22/2015] ciprofloxacin (CIPRO) IVPB 400 mg  400 mg Intravenous Q24H Otilio Miu, [facility]      . heparin injection 5,000 Units  5,000 Units Subcutaneous Q8H Norman Herrlich, MD   5,000 Units at 07/21/15 1518  . HYDROmorphone (DILAUDID) injection 1 mg  1 mg Intravenous Q4H PRN Norman Herrlich, MD   1 mg at 07/21/15 1520  . lanthanum (FOSRENOL) chewable tablet 1,000 mg  1,000 mg Oral TID WC Norman Herrlich, MD      . metroNIDAZOLE (FLAGYL) IVPB 500 mg  500 mg Intravenous Q8H Norman Herrlich, MD      . multivitamin (RENA-VIT) tablet 1 tablet  1 tablet Oral Daily Norman Herrlich, MD       Labs: Basic Metabolic Panel:  Recent Labs Lab 07/21/15 0810  NA 138  K 5.0  CL 90*  CO2 31  GLUCOSE 84  BUN 40*  CREATININE 10.02*  CALCIUM 8.5*   Liver Function Tests:  Recent Labs Lab 07/21/15 0810  AST 16  ALT 12*  ALKPHOS 78  BILITOT 0.4  PROT 8.2*  ALBUMIN 3.0*    Recent Labs Lab 07/21/15 0810  LIPASE 181*   CBC:  Recent Labs Lab 07/21/15 0810  WBC 13.5*  NEUTROABS 11.4*  HGB 11.8*  HCT 37.3*  MCV 90.5  PLT 265   Studies/Results: Ct Abdomen Pelvis W Contrast  07/21/2015  CLINICAL DATA:  Diffuse abdominal pain particularly in the right lower quadrant today EXAM: CT ABDOMEN AND PELVIS WITH CONTRAST TECHNIQUE: Multidetector CT imaging of the abdomen and pelvis was performed using the standard protocol following bolus administration of intravenous contrast. CONTRAST:  100 mL Isovue-300 COMPARISON:  09/02/2013 FINDINGS:  Lower chest: Possible partially visualized pulmonary nodule right lung base on the cranial most image measuring at least 3 mm. This could alternatively represent volume averaging with vascular anatomy. There is mild dependent atelectasis. Hepatobiliary: The liver and gallbladder are normal Pancreas: Pancreas is normal Spleen: Spleen is normal Adrenals/Urinary Tract: Adrenal glands are normal. No hydronephrosis. Innumerable bilateral renal cysts. 2 mm stone midpole left kidney. Bladder decompressed and  bladder wall appears thickened. Stomach/Bowel: There is extensive diverticulosis throughout the entire large bowel particularly the cecum, descending colon, and sigmoid colon. Sigmoid wall is thickened with moderate surrounding inflammatory change. Appendix is prominent at 10 mm but the inflammatory process does not appear to centered on the appendix. Vascular/Lymphatic: No acute findings. Small right lower quadrant lymph nodes nonpathologic by size criteria Reproductive: Negative Other: Trace right pericolic gutter free fluid Musculoskeletal: No acute findings. Diffuse osteosclerosis likely related to chronic renal disease. IMPRESSION: 1. Findings most consistent with diverticulitis of the cecum. 2. Evidence of polycystic renal disease 3. Cannot exclude pulmonary nodule right lung base. CT thorax without contrast recommended. Electronically Signed   By: Skipper Cliche M.D.   On: 07/21/2015 10:19    ROS: As per HPI otherwise negative.  Physical Exam: Filed Vitals:   07/21/15 1224 07/21/15 1230 07/21/15 1245 07/21/15 1358  BP: 115/80 118/86  122/72  Pulse: 90 83 98 90  Temp:    98.5 F (36.9 C)  TempSrc:    Oral  Resp: 19 21 26    Height:      Weight:      SpO2: 96% 100% 100% 98%     General: Well developed, well nourished pleasant in no acute distress. Head: Normocephalic, atraumatic, sclera non-icteric, mucus membranes are moist Neck: Supple. JVD not elevated. Lungs: Clear bilaterally to  auscultation without wheezes, rales, or rhonchi. Breathing is unlabored. Heart: RRR with S1 S2. No murmurs, rubs,  Abdomen: Soft, RLQ tenderness no rebound or guarding +BS M-S:  Strength and tone appear normal for age. Lower extremities:without edema or ischemic changes, no open wounds  Neuro: Alert and oriented X 3. Moves all extremities spontaneously. Psych:  Responds to questions appropriately with a normal affect. Dialysis Access: right lower AVF + bruit  Dialysis Orders: Center: [facility] M[facility] 4.5 hours 180 500 A 1.5 EDW 96.5 2K 2 Ca right lower AVF Hectorol 1 heparin 5000 Mircera 50 q 2 last 5/31 Recent labs:  hgb 10.3 5/31 21% sat ferritin 1476 iPTH 1242 P 6.6 Ca ok - > 10 at times  Assessment/Plan: 1.  Acute diverticulitis -cecum empiric cipro and flagyl, CL diet 2.  ESRD -  MWF - HD tomorrow - needs to stay on full tmt - K runs high -  3.  Hypertension/volume  - controlled - current edw seems appropriate- keep BP < 130/80 4.  Anemia  - hgb stable - redoe ESA  Next week due 6/14 -  5.  Metabolic bone disease -  Continue hectorol, sensipar ^ to 90 bid -should be outpt dose, fosrenol/phoslo ok to hold binders until off CL 6.  Nutrition - on CL at present 7.  Jehovah's Witness 8.  Hypertrophic pachymeningitis - followed by Dr. Erlinda Hong with serial MRI -stable 9.  Hx of GIB - never had colonoscopy after GIB in 2016 when hgb was too low to scope  Myriam Jacobson, PA-C [place] [phone] 07/21/2015, 3:45 PM   I have seen and examined this patient and agree with plan and assessment in the above note. Presented w/RLQ abd pain (prior diverticulitus). In ED WBC 13,500, CT w/changes of cecal diverticulitus. Getting cipro/flagyl. Plan for usual HD on Friday. Adjusted sensipar dose (outpt dose 180 mg/day not 90).  Wendy Mikles B,MD 07/21/2015 4:43 PM

## 2015-07-21 NOTE — Consult Note (Signed)
Pharmacy Antibiotic Note  Carl Bernosky Sr. is a 55 y.o. male admitted on 6/8/2017with cecal diverticulitis. Pharmacy has been consulted for Cipro dosing. He is ESRD on HD.  Plan: Cipro 400mg  IV q24 Pharmacy will sign off as no dose adjustments needed with dialysis  Height: 6\' 1"  (185.4 cm) Weight: 204 lb 2.3 oz (92.6 kg) IBW/kg (Calculated) : 79.9  Temp (24hrs), Avg:98.9 F (37.2 C), Min:98.9 F (37.2 C), Max:98.9 F (37.2 C)   Recent Labs Lab 07/21/15 0810  WBC 13.5*  CREATININE 10.02*    Estimated Creatinine Clearance: 9.3 mL/min (by C-G formula based on Cr of 10.02).    Allergies  Allergen Reactions  . Infed [Iron Dextran] Other (See Comments)    Decreased BP   . Oxycodone Nausea Only  . Vicodin [Hydrocodone-Acetaminophen] Other (See Comments)    Decreased BP  . Diclofenac Other (See Comments)    Unknown  . Whole Blood Other (See Comments)    Unk    Antimicrobials this admission: 6/8 Cipro >>  Dose adjustments this admission: n/a  Microbiology results: n/a  Thank you for allowing pharmacy to be a part of this patient's care.  Deboraha Sprang 07/21/2015 1:48 PM

## 2015-07-22 ENCOUNTER — Inpatient Hospital Stay (HOSPITAL_COMMUNITY): Payer: Medicare Other

## 2015-07-22 DIAGNOSIS — K5732 Diverticulitis of large intestine without perforation or abscess without bleeding: Principal | ICD-10-CM

## 2015-07-22 DIAGNOSIS — Z992 Dependence on renal dialysis: Secondary | ICD-10-CM

## 2015-07-22 DIAGNOSIS — N186 End stage renal disease: Secondary | ICD-10-CM

## 2015-07-22 DIAGNOSIS — R911 Solitary pulmonary nodule: Secondary | ICD-10-CM

## 2015-07-22 LAB — CBC
HCT: 32.9 % — ABNORMAL LOW (ref 39.0–52.0)
Hemoglobin: 10.2 g/dL — ABNORMAL LOW (ref 13.0–17.0)
MCH: 27.5 pg (ref 26.0–34.0)
MCHC: 31 g/dL (ref 30.0–36.0)
MCV: 88.7 fL (ref 78.0–100.0)
Platelets: 227 10*3/uL (ref 150–400)
RBC: 3.71 MIL/uL — ABNORMAL LOW (ref 4.22–5.81)
RDW: 16.2 % — ABNORMAL HIGH (ref 11.5–15.5)
WBC: 11.2 10*3/uL — ABNORMAL HIGH (ref 4.0–10.5)

## 2015-07-22 LAB — RENAL FUNCTION PANEL
Albumin: 2.5 g/dL — ABNORMAL LOW (ref 3.5–5.0)
Anion gap: 14 (ref 5–15)
BUN: 53 mg/dL — ABNORMAL HIGH (ref 6–20)
CO2: 26 mmol/L (ref 22–32)
Calcium: 7.6 mg/dL — ABNORMAL LOW (ref 8.9–10.3)
Chloride: 93 mmol/L — ABNORMAL LOW (ref 101–111)
Creatinine, Ser: 11.32 mg/dL — ABNORMAL HIGH (ref 0.61–1.24)
GFR calc Af Amer: 5 mL/min — ABNORMAL LOW (ref >60)
GFR calc non Af Amer: 4 mL/min — ABNORMAL LOW (ref >60)
Glucose, Bld: 84 mg/dL (ref 65–99)
Phosphorus: 6.5 mg/dL — ABNORMAL HIGH (ref 2.5–4.6)
Potassium: 5.6 mmol/L — ABNORMAL HIGH (ref 3.5–5.1)
Sodium: 133 mmol/L — ABNORMAL LOW (ref 135–145)

## 2015-07-22 MED ORDER — SODIUM CHLORIDE 0.9 % IV SOLN: 100.0000 mL | INTRAVENOUS | Status: DC | PRN

## 2015-07-22 MED ORDER — ACETAMINOPHEN 500 MG PO TABS
1000.0000 mg | ORAL_TABLET | Freq: Four times a day (QID) | ORAL | Status: DC | PRN
  Administered 2015-07-22 (×2): 1000 mg via ORAL
  Filled 2015-07-22 (×2): qty 2

## 2015-07-22 MED ORDER — PROMETHAZINE HCL 25 MG/ML IJ SOLN
12.5000 mg | Freq: Four times a day (QID) | INTRAMUSCULAR | Status: DC | PRN
  Administered 2015-07-22 – 2015-07-23 (×2): 12.5 mg via INTRAVENOUS
  Filled 2015-07-22 (×2): qty 1

## 2015-07-22 MED ORDER — HEPARIN SODIUM (PORCINE) 1000 UNIT/ML DIALYSIS: 20.0000 [IU]/kg | INTRAMUSCULAR | Status: DC | PRN

## 2015-07-22 MED ORDER — GABAPENTIN 100 MG PO CAPS
100.0000 mg | ORAL_CAPSULE | Freq: Two times a day (BID) | ORAL | Status: DC
  Administered 2015-07-22 – 2015-07-23 (×2): 100 mg via ORAL
  Filled 2015-07-22 (×2): qty 1

## 2015-07-22 MED ORDER — HEPARIN SODIUM (PORCINE) 1000 UNIT/ML DIALYSIS: 1000.0000 [IU] | INTRAMUSCULAR | Status: DC | PRN

## 2015-07-22 MED ORDER — LIDOCAINE HCL (PF) 1 % IJ SOLN: 5.0000 mL | INTRAMUSCULAR | Status: DC | PRN

## 2015-07-22 MED ORDER — LIDOCAINE-PRILOCAINE 2.5-2.5 % EX CREA: 1.0000 "application " | TOPICAL_CREAM | CUTANEOUS | Status: DC | PRN

## 2015-07-22 MED ORDER — DOXERCALCIFEROL 4 MCG/2ML IV SOLN
INTRAVENOUS | Status: AC
  Filled 2015-07-22: qty 2

## 2015-07-22 MED ORDER — ALTEPLASE 2 MG IJ SOLR: 2.0000 mg | Freq: Once | INTRAMUSCULAR | Status: DC | PRN

## 2015-07-22 MED ORDER — PENTAFLUOROPROP-TETRAFLUOROETH EX AERO: 1.0000 "application " | INHALATION_SPRAY | CUTANEOUS | Status: DC | PRN

## 2015-07-22 MED ORDER — CIPROFLOXACIN IN D5W 400 MG/200ML IV SOLN
400.0000 mg | INTRAVENOUS | Status: DC
  Filled 2015-07-22 (×2): qty 200

## 2015-07-22 NOTE — Progress Notes (Signed)
Pt. Was not in the room. Out of the unit

## 2015-07-22 NOTE — Progress Notes (Signed)
  Date: 07/22/2015  Patient name: Carl Hashimoto Sr.  Medical record number: [id number]  Date of birth: 1960/05/02   I have seen and evaluated Carl Found Sr. and discussed their care with the Residency Team. Briefly, [name] is a 55yo man with PMH of ESRD on HD, diverticulosis who presented with acute RLQ abdominal pain. He notes that on the day prior to admission, he ate a hamburger for lunch around 1pm and noticed that it seemed to get "stuck" in his chest or upper abdomen.  He then developed acute RLQ pain along with emesis.  He threw up his entire lunch at this time. He thought the pain would get better with rest, but he had a "rough" night and when it didn't get better, he came to the ED.  The pain is sharp in nature and constant.  Improved somewhat with medication given in the ED.  He denies any fever, chills, recent illness, blood in his stool.  He was able to eat ice chips in the ED.  CT abdomen in the ED showed acute diverticulitis in the cecum as well as an enlarged appendix.    Soc Hx : Former smoker  Filed Vitals:   07/22/15 1338 07/22/15 1413  BP: 133/93 112/62  Pulse: 84 82  Temp:    Resp:     Physical Exam:  Gen: Awake, alert, not in distress HENT: MMM, NCAT Eyes: EOMI, anicteric sclerae CV: RR, NR, no murmur Pulm: CTAB, no wheezing Abd: + BS, acutely TTP over LLQ with light palpation, otherwise no tenderness Ext: Thin, no edema Neuro: nonfocal exam  Pertinent data BUN 40 Cr 10  AST/ALT WNL Alb 3.0 Lipase 181  WBC 13.5  Assessment and Plan: I have seen and evaluated the patient as outlined above. I agree with the formulated Assessment and Plan as detailed in the residents' note, with the following changes:   1. Acute right sided diverticulitis - did okay with ice chips, trial of full liquid diet - pain control with IV dilaudid PRN - IVF with NS at 75cc/hr (dialysis patient) - Continue IV Abx with cipro/flagyl - Gen surgery is consulted, recommendations  reviewed.   2. ESRD on HD - HD planned for today  3. Pulmonary nodule - CT chest today per patients wishes (could be followed outpatient)  [name] 6/9/20172:49 PM

## 2015-07-22 NOTE — Progress Notes (Signed)
Pharmacist Student Provided - Patient Medication Education Education Environmental health practitioner Service Patient  Education: Educated the patient on some of current medications, specifically ciprofloxacin and metronidazole. Reviewed side effects with the patient. Answered the patient's medication specific questions. These medications are on the patient's current med list and are subject to change upon discharge.  [name]  P4 PharmD Candidate

## 2015-07-22 NOTE — Progress Notes (Signed)
[facility] Progress Note  Assessment/Plan: 1. Acute cecal diverticulitis -empiric cipro and flagyl, FL diet WBC 11.2 Tmax 98.9 2. ESRD - MWF - HD today - needs to stay on full tmt - K runs high 5.6 today - holding heparin today - if hgb remains stable can continue previous dose 3. Hypertension/volume - controlled - current edw seems appropriate- keep BP < 130/80 4. Anemia - hgb stable - redoe ESA Next week due 6/14 -  5. Metabolic bone disease - Continue hectorol, sensipar ^ to 90 bid = outpt dose, fosrenol/phoslo ok to hold binders until off CL 6. Nutrition - on FL at present 7. 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 Hx of GIB - never had colonoscopy after GIB in 2016 when hgb was too low to scope 10. New right lung nodule -Chest CT today  Myriam Jacobson, PA-C [facility] [place] [phone] 07/22/2015,10:07 AM  LOS: 1 day   Subjective:   Leaving for Chest CT to eval spot on his lung; c/o HA today; ate very little  Objective Filed Vitals:   07/21/15 1245 07/21/15 1358 07/21/15 2040 07/22/15 0458  BP:  122/72 109/75 103/78  Pulse: 98 90 86 82  Temp:  98.5 F (36.9 C) 98.1 F (36.7 C) 98.2 F (36.8 C)  TempSrc:  Oral Oral Oral  Resp: 26  18 19   Height:  6\' 1"  (1.854 m)    Weight:  100.699 kg (222 lb)    SpO2: 100% 98% 98% 98%   Physical Exam General: NAD Heart: RRR Lungs: clear without rales Abdomen: soft RLQ tenderness Extremities: no LE edema Dialysis Access: right lower AVF + bruit  Dialysis Orders: Dialysis Orders: Center: [facility] M[facility] 4.5 hours 180 500 A 1.5 EDW 96.5 2K 2 Ca right lower AVF Hectorol 1 heparin 5000 Mircera 50 q 2 last 5/31 Recent labs: hgb 10.3 5/31 21% sat ferritin 1476 iPTH 1242 P 6.6 Ca ok - > 10 at times  Additional Objective Labs: Basic Metabolic Panel:  Recent Labs Lab 07/21/15 0810 07/22/15 0249  NA 138 133*  K 5.0 5.6*  CL 90* 93*  CO2 31 26   GLUCOSE 84 84  BUN 40* 53*  CREATININE 10.02* 11.32*  CALCIUM 8.5* 7.6*  PHOS  --  6.5*   Liver Function Tests:  Recent Labs Lab 07/21/15 0810 07/22/15 0249  AST 16  --   ALT 12*  --   ALKPHOS 78  --   BILITOT 0.4  --   PROT 8.2*  --   ALBUMIN 3.0* 2.5*    Recent Labs Lab 07/21/15 0810  LIPASE 181*   CBC:  Recent Labs Lab 07/21/15 0810 07/22/15 0249  WBC 13.5* 11.2*  NEUTROABS 11.4*  --   HGB 11.8* 10.2*  HCT 37.3* 32.9*  MCV 90.5 88.7  PLT 265 227   Blood Culture    Component Value Date/Time   SDES WOUND GRAFT ARM LEFT 03/25/2009 1500   SDES WOUND GRAFT ARM LEFT 03/25/2009 1500   SPECREQUEST PATIENT ON FOLLOWING ANCEF 03/25/2009 1500   SPECREQUEST PATIENT ON FOLLOWING ANCEF 03/25/2009 1500   CULT NO ANAEROBES ISOLATED 03/25/2009 1500   CULT NO GROWTH 2 DAYS 03/25/2009 1500   REPTSTATUS 03/30/2009 FINAL 03/25/2009 1500   REPTSTATUS 03/28/2009 FINAL 03/25/2009 1500    Cardiac Enzymes: No results for input(s): CKTOTAL, CKMB, CKMBINDEX, TROPONINI in the last 168 hours. CBG: No results for input(s): GLUCAP in the last 168  hours. Iron Studies: No results for input(s): IRON, TIBC, TRANSFERRIN, FERRITIN in the last 72 hours. @lablastinr3 @ Studies/Results: Ct Abdomen Pelvis W Contrast  07/21/2015  CLINICAL DATA:  Diffuse abdominal pain particularly in the right lower quadrant today EXAM: CT ABDOMEN AND PELVIS WITH CONTRAST TECHNIQUE: Multidetector CT imaging of the abdomen and pelvis was performed using the standard protocol following bolus administration of intravenous contrast. CONTRAST:  100 mL Isovue-300 COMPARISON:  09/02/2013 FINDINGS: Lower chest: Possible partially visualized pulmonary nodule right lung base on the cranial most image measuring at least 3 mm. This could alternatively represent volume averaging with vascular anatomy. There is mild dependent atelectasis. Hepatobiliary: The liver and gallbladder are normal Pancreas: Pancreas is normal Spleen:  Spleen is normal Adrenals/Urinary Tract: Adrenal glands are normal. No hydronephrosis. Innumerable bilateral renal cysts. 2 mm stone midpole left kidney. Bladder decompressed and bladder wall appears thickened. Stomach/Bowel: There is extensive diverticulosis throughout the entire large bowel particularly the cecum, descending colon, and sigmoid colon. Sigmoid wall is thickened with moderate surrounding inflammatory change. Appendix is prominent at 10 mm but the inflammatory process does not appear to centered on the appendix. Vascular/Lymphatic: No acute findings. Small right lower quadrant lymph nodes nonpathologic by size criteria Reproductive: Negative Other: Trace right pericolic gutter free fluid Musculoskeletal: No acute findings. Diffuse osteosclerosis likely related to chronic renal disease. IMPRESSION: 1. Findings most consistent with diverticulitis of the cecum. 2. Evidence of polycystic renal disease 3. Cannot exclude pulmonary nodule right lung base. CT thorax without contrast recommended. Electronically Signed   By: Skipper Cliche M.D.   On: 07/21/2015 10:19   Medications:   . calcium acetate  2,001 mg Oral TID WC  . cinacalcet  90 mg Oral BID WC  . ciprofloxacin  400 mg Intravenous Q24H  . [START ON 07/27/2015] darbepoetin (ARANESP) injection - DIALYSIS  60 mcg Intravenous Q Wed-HD  . doxercalciferol  1 mcg Intravenous Q M,W,F-HD  . feeding supplement  1 Container Oral BID BM  . heparin  5,000 Units Subcutaneous Q8H  . lanthanum  1,000 mg Oral TID WC  . metronidazole  500 mg Intravenous Q8H  . multivitamin  1 tablet Oral Daily

## 2015-07-22 NOTE — Progress Notes (Signed)
Subjective: Overnight, no acute complaints.  Continues to have RLQ abdominal pain. Dialysis is scheduled for 1 pm.  Lung nodule that incidentally found on CT-Abd and reccommendedation for further workup was discussed with patient.  Patient desires workup inpatient rather than outpatient.  Update: Diet was advanced to full liquid diet this morning but patient became nauseous and had an episode of emesis afternoon.    Objective: Vital signs in last 24 hours: Filed Vitals:   07/21/15 1245 07/21/15 1358 07/21/15 2040 07/22/15 0458  BP:  122/72 109/75 103/78  Pulse: 98 90 86 82  Temp:  98.5 F (36.9 C) 98.1 F (36.7 C) 98.2 F (36.8 C)  TempSrc:  Oral Oral Oral  Resp: 26  18 19   Height:  6\' 1"  (1.854 m)    Weight:  100.699 kg (222 lb)    SpO2: 100% 98% 98% 98%  Physical Exam  Constitutional: He is cooperative.  Non-toxic appearance. No distress.  Cardiovascular: Normal rate, regular rhythm, S1 normal and S2 normal.   Pulmonary/Chest: Effort normal and breath sounds normal. No respiratory distress.  Abdominal: Soft. Normal appearance and bowel sounds are normal. There is tenderness in the right lower quadrant. There is no rigidity, no rebound and no guarding.  Neurological: He is alert.   Lab Results: Basic Metabolic Panel:  Recent Labs  07/21/15 0810 07/22/15 0249  NA 138 133*  K 5.0 5.6*  CL 90* 93*  CO2 31 26  GLUCOSE 84 84  BUN 40* 53*  CREATININE 10.02* 11.32*  CALCIUM 8.5* 7.6*  PHOS  --  6.5*   Liver Function Tests:  Recent Labs  07/21/15 0810 07/22/15 0249  AST 16  --   ALT 12*  --   ALKPHOS 78  --   BILITOT 0.4  --   PROT 8.2*  --   ALBUMIN 3.0* 2.5*    Recent Labs  07/21/15 0810  LIPASE 181*   CBC:  Recent Labs  07/21/15 0810 07/22/15 0249  WBC 13.5* 11.2*  NEUTROABS 11.4*  --   HGB 11.8* 10.2*  HCT 37.3* 32.9*  MCV 90.5 88.7  PLT 265 227   Studies/Results: Ct Chest Wo Contrast  07/22/2015  CLINICAL DATA:  Right lung nodule EXAM: CT  CHEST WITHOUT CONTRAST TECHNIQUE: Multidetector CT imaging of the chest was performed following the standard protocol without IV contrast. COMPARISON:  CT abdomen 07/21/2015 FINDINGS: No abnormal mediastinal adenopathy. Three vessel coronary artery calcifications. Minimal aortic arch atherosclerotic calcification. No pericardial effusion. No pneumothorax.  No pleural effusion Tiny visceral pleural node in the right minor fissure corresponds to the abnormality described on the CT. Bibasilar heterogeneous dependent opacities are compatible with volume loss versus patchy pneumonia. Irregular pleural based tiny parenchymal density in the right upper lobe on image 38 is likely scarring. No destructive bone lesion. Mild wedging of mid-level thoracic vertebral bodies has a chronic appearance. IMPRESSION: The abnormality noted on the CT abdomen towards the right lung base is a sub cm short axis diameter of visceral pleural node and has a benign appearance. Bibasilar patchy atelectasis versus airspace disease. Electronically Signed   By: Marybelle Killings M.D.   On: 07/22/2015 10:27   Ct Abdomen Pelvis W Contrast  07/21/2015  CLINICAL DATA:  Diffuse abdominal pain particularly in the right lower quadrant today EXAM: CT ABDOMEN AND PELVIS WITH CONTRAST TECHNIQUE: Multidetector CT imaging of the abdomen and pelvis was performed using the standard protocol following bolus administration of intravenous contrast. CONTRAST:  100 mL Isovue-300 COMPARISON:  09/02/2013 FINDINGS: Lower chest: Possible partially visualized pulmonary nodule right lung base on the cranial most image measuring at least 3 mm. This could alternatively represent volume averaging with vascular anatomy. There is mild dependent atelectasis. Hepatobiliary: The liver and gallbladder are normal Pancreas: Pancreas is normal Spleen: Spleen is normal Adrenals/Urinary Tract: Adrenal glands are normal. No hydronephrosis. Innumerable bilateral renal cysts. 2 mm stone  midpole left kidney. Bladder decompressed and bladder wall appears thickened. Stomach/Bowel: There is extensive diverticulosis throughout the entire large bowel particularly the cecum, descending colon, and sigmoid colon. Sigmoid wall is thickened with moderate surrounding inflammatory change. Appendix is prominent at 10 mm but the inflammatory process does not appear to centered on the appendix. Vascular/Lymphatic: No acute findings. Small right lower quadrant lymph nodes nonpathologic by size criteria Reproductive: Negative Other: Trace right pericolic gutter free fluid Musculoskeletal: No acute findings. Diffuse osteosclerosis likely related to chronic renal disease. IMPRESSION: 1. Findings most consistent with diverticulitis of the cecum. 2. Evidence of polycystic renal disease 3. Cannot exclude pulmonary nodule right lung base. CT thorax without contrast recommended. Electronically Signed   By: Skipper Cliche M.D.   On: 07/21/2015 10:19   Medications:  Scheduled Meds: . calcium acetate  2,001 mg Oral TID WC  . cinacalcet  90 mg Oral BID WC  . ciprofloxacin  400 mg Intravenous Q24H  . [START ON 07/27/2015] darbepoetin (ARANESP) injection - DIALYSIS  60 mcg Intravenous Q Wed-HD  . doxercalciferol  1 mcg Intravenous Q M,W,F-HD  . feeding supplement  1 Container Oral BID BM  . heparin  5,000 Units Subcutaneous Q8H  . lanthanum  1,000 mg Oral TID WC  . metronidazole  500 mg Intravenous Q8H  . multivitamin  1 tablet Oral Daily   Continuous Infusions:  PRN Meds:.acetaminophen, HYDROmorphone (DILAUDID) injection   Problem List Principal Problem:   Diverticulitis of cecum Active Problems:   GERD (gastroesophageal reflux disease)   ESRD on dialysis (HCC)   Anemia of renal disease   HLD (hyperlipidemia)   Diverticulitis of large intestine without perforation or abscess without bleeding   Right lower quadrant pain  Assessment/Plan: Mr. Marceleno is a 55 year old African American male with a  history of diverticulosis, diverticular hemorrhage (2006) and ESRD on HD shown to have diverticulitis of the cecum on CT-Abdomen.  Diverticulitis of the Cecum:  RLQ is improving. Hemodynamically stable. WBC trended down to 11.2.  Currently on IV Flagyl and Cipro -- Continue IV Flagyl and Cipro  -- transition to PO antibiotics when patient tolerates advanced diet -- r/p CBC in the morning   Nausea/Vomiting: Pt unable to tolerate full liquid diet.   -- restart patient on clear diet -- begin Phenergan 12.5mg  q6h IV PRN -- attempt advance diet in the morning  Pulmonary Nodule: Possible ~3 mm pulmonary nodule on right lung base incidentally seen on CT-Abdomen. Further workup with CT chest performed today due to patient's increased probability for malignancy due to smoking history and age (>31 years old).  CT chest found a sub cm short axis diameter of visceral pleural node that  has a benign appearance. Follow-up serial CT scans in the outpatient is recommended as findings are benign and patient is at high clinical risk for malignancy   -- follow-up surveillance CT chest recommended in the outpatient setting  ESRD on HD: Cr. 11.2, consistent with baseline. On MWF Hemodialysis schedule. -- continue home dialysis medication: Sensipar 90mg  daily, Fosrenol 1000mg  TID, Phoslo 667mg  TID -- Hemodialysis scheduled for this  afternoon  Anemia secondary to ESRD- Stable.  Hbg 11.2, consistent with baseline -- continue home DIALYVITE 3mg  daily  Diet: Clear Liquid   PPX -- DVT: IM Heparin  Code Status: FULL  Dispo: Pending tolerance of oral diet.  Hemodynamic stability and resolution of abdominal ain and leukocytosis is reassuring.   This is a Careers information officer Note.  The care of the patient was discussed with Dr. Hulen Luster and the assessment and plan formulated with their assistance.  Please see their attached note for official documentation of the daily encounter.   LOS: 1 day   Garald Braver,  Med Student 07/22/2015, 10:15 AM

## 2015-07-22 NOTE — Progress Notes (Signed)
Subjective: Pt is still having abdominal pain that has improved.   Objective: Vital signs in last 24 hours: Filed Vitals:   07/21/15 1245 07/21/15 1358 07/21/15 2040 07/22/15 0458  BP:  122/72 109/75 103/78  Pulse: 98 90 86 82  Temp:  98.5 F (36.9 C) 98.1 F (36.7 C) 98.2 F (36.8 C)  TempSrc:  Oral Oral Oral  Resp: 26  18 19   Height:  6\' 1"  (1.854 m)    Weight:  222 lb (100.699 kg)    SpO2: 100% 98% 98% 98%   Weight change:   Intake/Output Summary (Last 24 hours) at 07/22/15 1208 Last data filed at 07/22/15 1026  Gross per 24 hour  Intake    120 ml  Output    500 ml  Net   -380 ml   General: NAD, laying in bed comfortably Lungs: CTAB, no wheezing or crackles Cardiac: RRR, no murmurs, rubs, or gallops GI: soft, active bowel sounds, TTP of RLQ Neuro: CN II-XII grossly intact  Lab Results: Basic Metabolic Panel:  Recent Labs Lab 07/21/15 0810 07/22/15 0249  NA 138 133*  K 5.0 5.6*  CL 90* 93*  CO2 31 26  GLUCOSE 84 84  BUN 40* 53*  CREATININE 10.02* 11.32*  CALCIUM 8.5* 7.6*  PHOS  --  6.5*   Liver Function Tests:  Recent Labs Lab 07/21/15 0810 07/22/15 0249  AST 16  --   ALT 12*  --   ALKPHOS 78  --   BILITOT 0.4  --   PROT 8.2*  --   ALBUMIN 3.0* 2.5*    Recent Labs Lab 07/21/15 0810  LIPASE 181*   CBC:  Recent Labs Lab 07/21/15 0810 07/22/15 0249  WBC 13.5* 11.2*  NEUTROABS 11.4*  --   HGB 11.8* 10.2*  HCT 37.3* 32.9*  MCV 90.5 88.7  PLT 265 227   Studies/Results: Ct Chest Wo Contrast  07/22/2015  CLINICAL DATA:  Right lung nodule EXAM: CT CHEST WITHOUT CONTRAST TECHNIQUE: Multidetector CT imaging of the chest was performed following the standard protocol without IV contrast. COMPARISON:  CT abdomen 07/21/2015 FINDINGS: No abnormal mediastinal adenopathy. Three vessel coronary artery calcifications. Minimal aortic arch atherosclerotic calcification. No pericardial effusion. No pneumothorax.  No pleural effusion Tiny visceral  pleural node in the right minor fissure corresponds to the abnormality described on the CT. Bibasilar heterogeneous dependent opacities are compatible with volume loss versus patchy pneumonia. Irregular pleural based tiny parenchymal density in the right upper lobe on image 38 is likely scarring. No destructive bone lesion. Mild wedging of mid-level thoracic vertebral bodies has a chronic appearance. IMPRESSION: The abnormality noted on the CT abdomen towards the right lung base is a sub cm short axis diameter of visceral pleural node and has a benign appearance. Bibasilar patchy atelectasis versus airspace disease. Electronically Signed   By: Marybelle Killings M.D.   On: 07/22/2015 10:27   Ct Abdomen Pelvis W Contrast  07/21/2015  CLINICAL DATA:  Diffuse abdominal pain particularly in the right lower quadrant today EXAM: CT ABDOMEN AND PELVIS WITH CONTRAST TECHNIQUE: Multidetector CT imaging of the abdomen and pelvis was performed using the standard protocol following bolus administration of intravenous contrast. CONTRAST:  100 mL Isovue-300 COMPARISON:  09/02/2013 FINDINGS: Lower chest: Possible partially visualized pulmonary nodule right lung base on the cranial most image measuring at least 3 mm. This could alternatively represent volume averaging with vascular anatomy. There is mild dependent atelectasis. Hepatobiliary: The liver and gallbladder are normal Pancreas:  Pancreas is normal Spleen: Spleen is normal Adrenals/Urinary Tract: Adrenal glands are normal. No hydronephrosis. Innumerable bilateral renal cysts. 2 mm stone midpole left kidney. Bladder decompressed and bladder wall appears thickened. Stomach/Bowel: There is extensive diverticulosis throughout the entire large bowel particularly the cecum, descending colon, and sigmoid colon. Sigmoid wall is thickened with moderate surrounding inflammatory change. Appendix is prominent at 10 mm but the inflammatory process does not appear to centered on the  appendix. Vascular/Lymphatic: No acute findings. Small right lower quadrant lymph nodes nonpathologic by size criteria Reproductive: Negative Other: Trace right pericolic gutter free fluid Musculoskeletal: No acute findings. Diffuse osteosclerosis likely related to chronic renal disease. IMPRESSION: 1. Findings most consistent with diverticulitis of the cecum. 2. Evidence of polycystic renal disease 3. Cannot exclude pulmonary nodule right lung base. CT thorax without contrast recommended. Electronically Signed   By: Skipper Cliche M.D.   On: 07/21/2015 10:19   Medications: I have reviewed the patient's current medications. Scheduled Meds: . calcium acetate  2,001 mg Oral TID WC  . cinacalcet  90 mg Oral BID WC  . ciprofloxacin  400 mg Intravenous Q24H  . [START ON 07/27/2015] darbepoetin (ARANESP) injection - DIALYSIS  60 mcg Intravenous Q Wed-HD  . doxercalciferol  1 mcg Intravenous Q M,W,F-HD  . feeding supplement  1 Container Oral BID BM  . heparin  5,000 Units Subcutaneous Q8H  . lanthanum  1,000 mg Oral TID WC  . metronidazole  500 mg Intravenous Q8H  . multivitamin  1 tablet Oral Daily   Continuous Infusions:  PRN Meds:.acetaminophen, HYDROmorphone (DILAUDID) injection, promethazine Assessment/Plan: Principal Problem:   Diverticulitis of cecum Active Problems:   GERD (gastroesophageal reflux disease)   ESRD on dialysis (HCC)   Anemia of renal disease   HLD (hyperlipidemia)   Diverticulitis of large intestine without perforation or abscess without bleeding   Right lower quadrant pain   1. Diverticulitis of cecum-- white count is trending down. Attempted to increase his diet from clear liquids to full liquids which he could not tolerate - surgery following  - continue IV cipro and flagyl - clear liquid diet, started phenergan 12.5mg  q6h prn for nausea - IV dilaudid 1mg  q4h prn for pain - CTM and advance diet as tolerated.   2. ESRD MWF HD - nephro following, HD today -  continue home renal meds  3. Pulmonary nodule-- rt lung base nodule measuring at least 28mm noted CT abd/pelvis which could alternatively represent volume averaging with vascular anatomy. Repeat bed weight yesterday reveals weight has been stable, weighs 222lbs this admission, previously 217lb 3 months ago. CT chest w/o contrast reveals subcentimeter nodule w/ benign apperance.  - will recommend surveillance CT chest in 12 months per Fleischner Criteria on discharge.    DVT ppx-- heparin   Dispo: Disposition is deferred at this time, awaiting improvement of current medical problems.   The patient does not have a current PCP Jamal Maes, MD) and does need an [facility] hospital follow-up appointment after discharge.  The patient does not have transportation limitations that hinder transportation to clinic appointments. .Services Needed at time of discharge: Y = Yes, Blank = No PT:   OT:   RN:   Equipment:   Other:     LOS: 1 day   Norman Herrlich, MD 07/22/2015, 12:08 PM

## 2015-07-22 NOTE — Procedures (Signed)
I was present at this dialysis session. I have reviewed the session itself and made appropriate changes.   Filed Weights   07/21/15 0711 07/21/15 1358  Weight: 92.6 kg (204 lb 2.3 oz) 100.699 kg (222 lb)     Recent Labs Lab 07/22/15 0249  NA 133*  K 5.6*  CL 93*  CO2 26  GLUCOSE 84  BUN 53*  CREATININE 11.32*  CALCIUM 7.6*  PHOS 6.5*     Recent Labs Lab 07/21/15 0810 07/22/15 0249  WBC 13.5* 11.2*  NEUTROABS 11.4*  --   HGB 11.8* 10.2*  HCT 37.3* 32.9*  MCV 90.5 88.7  PLT 265 227    Scheduled Meds: . calcium acetate  2,001 mg Oral TID WC  . cinacalcet  90 mg Oral BID WC  . ciprofloxacin  400 mg Intravenous Q24H  . [START ON 07/27/2015] darbepoetin (ARANESP) injection - DIALYSIS  60 mcg Intravenous Q Wed-HD  . doxercalciferol  1 mcg Intravenous Q M,W,F-HD  . feeding supplement  1 Container Oral BID BM  . heparin  5,000 Units Subcutaneous Q8H  . lanthanum  1,000 mg Oral TID WC  . metronidazole  500 mg Intravenous Q8H  . multivitamin  1 tablet Oral Daily   Continuous Infusions:  PRN Meds:.acetaminophen, HYDROmorphone (DILAUDID) injection, promethazine   Pearson Grippe  MD 07/22/2015, 1:40 PM

## 2015-07-22 NOTE — Progress Notes (Signed)
[facility] Progress Note     Subjective: Laying in bed, NAD. Complains of HA this AM. Reports abdominal pain in RLQ that is a 5/10 at rest, non-radiating, worse with movement. Denies fever/chills overnight. Denies N/V/D. Last BM was yesterday and was formed. Denies melena/hematochezia. Ambulating. Tolerating clears.  Pt has HD today.  WBC trending down (11.2 today).  No episodes of tachycardia or fevers overnight.  Objective: Vital signs in last 24 hours: Temp:  [98.1 F (36.7 C)-98.5 F (36.9 C)] 98.2 F (36.8 C) (06/09 0458) Pulse Rate:  [82-102] 82 (06/09 0458) Resp:  [12-27] 19 (06/09 0458) BP: (103-122)/(72-86) 103/78 mmHg (06/09 0458) SpO2:  [96 %-100 %] 98 % (06/09 0458) Weight:  [100.699 kg (222 lb)] 100.699 kg (222 lb) (06/08 1358) Last BM Date:  (pta)  Intake/Output from previous day:   Intake/Output this shift:    PE: Gen:  Alert, NAD, pleasant Card:  RRR, no M/G/R heard Pulm:  CTA, no W/R/R Abd: Soft, ND, TTP of RLQ, +BS, no peritoneal signs Ext:  No erythema, edema, or tenderness  Lab Results:   Recent Labs  07/21/15 0810 07/22/15 0249  WBC 13.5* 11.2*  HGB 11.8* 10.2*  HCT 37.3* 32.9*  PLT 265 227   BMET  Recent Labs  07/21/15 0810 07/22/15 0249  NA 138 133*  K 5.0 5.6*  CL 90* 93*  CO2 31 26  GLUCOSE 84 84  BUN 40* 53*  CREATININE 10.02* 11.32*  CALCIUM 8.5* 7.6*   PT/INR No results for input(s): LABPROT, INR in the last 72 hours. CMP     Component Value Date/Time   NA 133* 07/22/2015 0249   K 5.6* 07/22/2015 0249   CL 93* 07/22/2015 0249   CO2 26 07/22/2015 0249   GLUCOSE 84 07/22/2015 0249   BUN 53* 07/22/2015 0249   CREATININE 11.32* 07/22/2015 0249   CREATININE 11.39* 12/09/2012 1221   CALCIUM 7.6* 07/22/2015 0249   CALCIUM 8.0* 08/02/2006 1133   PROT 8.2* 07/21/2015 0810   ALBUMIN 2.5* 07/22/2015 0249   AST 16 07/21/2015 0810   ALT 12* 07/21/2015 0810   ALKPHOS 78 07/21/2015 0810   BILITOT 0.4  07/21/2015 0810   GFRNONAA 4* 07/22/2015 0249   GFRAA 5* 07/22/2015 0249   Lipase     Component Value Date/Time   LIPASE 181* 07/21/2015 0810       Studies/Results: Ct Abdomen Pelvis W Contrast  07/21/2015  CLINICAL DATA:  Diffuse abdominal pain particularly in the right lower quadrant today EXAM: CT ABDOMEN AND PELVIS WITH CONTRAST TECHNIQUE: Multidetector CT imaging of the abdomen and pelvis was performed using the standard protocol following bolus administration of intravenous contrast. CONTRAST:  100 mL Isovue-300 COMPARISON:  09/02/2013 FINDINGS: Lower chest: Possible partially visualized pulmonary nodule right lung base on the cranial most image measuring at least 3 mm. This could alternatively represent volume averaging with vascular anatomy. There is mild dependent atelectasis. Hepatobiliary: The liver and gallbladder are normal Pancreas: Pancreas is normal Spleen: Spleen is normal Adrenals/Urinary Tract: Adrenal glands are normal. No hydronephrosis. Innumerable bilateral renal cysts. 2 mm stone midpole left kidney. Bladder decompressed and bladder wall appears thickened. Stomach/Bowel: There is extensive diverticulosis throughout the entire large bowel particularly the cecum, descending colon, and sigmoid colon. Sigmoid wall is thickened with moderate surrounding inflammatory change. Appendix is prominent at 10 mm but the inflammatory process does not appear to centered on the appendix. Vascular/Lymphatic: No acute findings. Small right lower quadrant lymph nodes nonpathologic by size  criteria Reproductive: Negative Other: Trace right pericolic gutter free fluid Musculoskeletal: No acute findings. Diffuse osteosclerosis likely related to chronic renal disease. IMPRESSION: 1. Findings most consistent with diverticulitis of the cecum. 2. Evidence of polycystic renal disease 3. Cannot exclude pulmonary nodule right lung base. CT thorax without contrast recommended. Electronically Signed   By:  Skipper Cliche M.D.   On: 07/21/2015 10:19    Anti-infectives: Anti-infectives    Start     Dose/Rate Route Frequency Ordered Stop   07/22/15 1200  ciprofloxacin (CIPRO) IVPB 400 mg     400 mg 200 mL/hr over 60 Minutes Intravenous Every 24 hours 07/21/15 1341     07/21/15 1900  metroNIDAZOLE (FLAGYL) IVPB 500 mg     500 mg 100 mL/hr over 60 Minutes Intravenous Every 8 hours 07/21/15 1330     07/21/15 1030  ciprofloxacin (CIPRO) IVPB 400 mg     400 mg 200 mL/hr over 60 Minutes Intravenous  Once 07/21/15 1026 07/21/15 1244   07/21/15 1030  metroNIDAZOLE (FLAGYL) IVPB 500 mg     500 mg 100 mL/hr over 60 Minutes Intravenous  Once 07/21/15 1026 07/21/15 2146       Assessment/Plan Diverticulitis of the cecum - continue non-op management with abx and pain control FEN: advance to full liquid diet ID: cipro/flagyl day #2 DVT proph: Heparin, SCDs Dispo: floor   Leukocytosis - 11.2 from 13.5 yesterday  ESRD on HD  Hypertension Anemia of chronic disease Sleep apnea - CPAP sometimes complaint Hx of GI bleed 07/2014 Stroke 2016 Chronic headaches    LOS: 1 day    Jill Alexanders , [facility] 07/22/2015, 7:54 AM Pager: [phone] Mon-Fri 7:00 am-4:30 pm Sat-Sun 7:00 am-11:30 am

## 2015-07-22 NOTE — Progress Notes (Signed)
Pt. Was not in room

## 2015-07-23 MED ORDER — CIPROFLOXACIN HCL 500 MG PO TABS: 500.0000 mg | ORAL_TABLET | Freq: Every day | ORAL | Status: DC

## 2015-07-23 MED ORDER — KETOROLAC TROMETHAMINE 15 MG/ML IJ SOLN
15.0000 mg | Freq: Once | INTRAMUSCULAR | Status: AC
  Administered 2015-07-23: 15 mg via INTRAVENOUS
  Filled 2015-07-23: qty 1

## 2015-07-23 MED ORDER — METRONIDAZOLE 500 MG PO TABS: 500.0000 mg | ORAL_TABLET | Freq: Three times a day (TID) | ORAL | Status: DC

## 2015-07-23 MED ORDER — ACETAMINOPHEN 500 MG PO TABS
1000.0000 mg | ORAL_TABLET | Freq: Three times a day (TID) | ORAL | Status: DC
  Administered 2015-07-23: 1000 mg via ORAL
  Filled 2015-07-23: qty 2

## 2015-07-23 NOTE — Progress Notes (Signed)
[facility] Progress Note     Subjective: Sitting in chair, NAD. Abd pain getting better.  No nausea. Tolerating clears.   WBC trending down.   Objective: Vital signs in last 24 hours: Temp:  [98.1 F (36.7 C)-98.9 F (37.2 C)] 98.1 F (36.7 C) (06/10 0616) Pulse Rate:  [81-98] 95 (06/10 0616) Resp:  [16-19] 19 (06/10 0616) BP: (104-136)/(61-93) 136/83 mmHg (06/10 0616) SpO2:  [98 %] 98 % (06/10 0616) Weight:  [95.2 kg (209 lb 14.1 oz)-98.6 kg (217 lb 6 oz)] 95.2 kg (209 lb 14.1 oz) (06/09 1826) Last BM Date:  (pta)  Intake/Output from previous day: 06/09 0701 - 06/10 0700 In: 460 [P.O.:360; IV Piggyback:100] Out: 3400 [Urine:300; Emesis/NG output:500] Intake/Output this shift:    PE: Gen:  Alert, NAD, pleasant Abd: Soft, ND, mild TTP in RLQ Ext:  No erythema, edema, or tenderness  Lab Results:   Recent Labs  07/21/15 0810 07/22/15 0249  WBC 13.5* 11.2*  HGB 11.8* 10.2*  HCT 37.3* 32.9*  PLT 265 227   BMET  Recent Labs  07/21/15 0810 07/22/15 0249  NA 138 133*  K 5.0 5.6*  CL 90* 93*  CO2 31 26  GLUCOSE 84 84  BUN 40* 53*  CREATININE 10.02* 11.32*  CALCIUM 8.5* 7.6*   PT/INR No results for input(s): LABPROT, INR in the last 72 hours. CMP     Component Value Date/Time   NA 133* 07/22/2015 0249   K 5.6* 07/22/2015 0249   CL 93* 07/22/2015 0249   CO2 26 07/22/2015 0249   GLUCOSE 84 07/22/2015 0249   BUN 53* 07/22/2015 0249   CREATININE 11.32* 07/22/2015 0249   CREATININE 11.39* 12/09/2012 1221   CALCIUM 7.6* 07/22/2015 0249   CALCIUM 8.0* 08/02/2006 1133   PROT 8.2* 07/21/2015 0810   ALBUMIN 2.5* 07/22/2015 0249   AST 16 07/21/2015 0810   ALT 12* 07/21/2015 0810   ALKPHOS 78 07/21/2015 0810   BILITOT 0.4 07/21/2015 0810   GFRNONAA 4* 07/22/2015 0249   GFRAA 5* 07/22/2015 0249   Lipase     Component Value Date/Time   LIPASE 181* 07/21/2015 0810       Studies/Results: Ct Chest Wo Contrast  07/22/2015  CLINICAL  DATA:  Right lung nodule EXAM: CT CHEST WITHOUT CONTRAST TECHNIQUE: Multidetector CT imaging of the chest was performed following the standard protocol without IV contrast. COMPARISON:  CT abdomen 07/21/2015 FINDINGS: No abnormal mediastinal adenopathy. Three vessel coronary artery calcifications. Minimal aortic arch atherosclerotic calcification. No pericardial effusion. No pneumothorax.  No pleural effusion Tiny visceral pleural node in the right minor fissure corresponds to the abnormality described on the CT. Bibasilar heterogeneous dependent opacities are compatible with volume loss versus patchy pneumonia. Irregular pleural based tiny parenchymal density in the right upper lobe on image 38 is likely scarring. No destructive bone lesion. Mild wedging of mid-level thoracic vertebral bodies has a chronic appearance. IMPRESSION: The abnormality noted on the CT abdomen towards the right lung base is a sub cm short axis diameter of visceral pleural node and has a benign appearance. Bibasilar patchy atelectasis versus airspace disease. Electronically Signed   By: Marybelle Killings M.D.   On: 07/22/2015 10:27   Ct Abdomen Pelvis W Contrast  07/21/2015  CLINICAL DATA:  Diffuse abdominal pain particularly in the right lower quadrant today EXAM: CT ABDOMEN AND PELVIS WITH CONTRAST TECHNIQUE: Multidetector CT imaging of the abdomen and pelvis was performed using the standard protocol following bolus administration of intravenous contrast.  CONTRAST:  100 mL Isovue-300 COMPARISON:  09/02/2013 FINDINGS: Lower chest: Possible partially visualized pulmonary nodule right lung base on the cranial most image measuring at least 3 mm. This could alternatively represent volume averaging with vascular anatomy. There is mild dependent atelectasis. Hepatobiliary: The liver and gallbladder are normal Pancreas: Pancreas is normal Spleen: Spleen is normal Adrenals/Urinary Tract: Adrenal glands are normal. No hydronephrosis. Innumerable  bilateral renal cysts. 2 mm stone midpole left kidney. Bladder decompressed and bladder wall appears thickened. Stomach/Bowel: There is extensive diverticulosis throughout the entire large bowel particularly the cecum, descending colon, and sigmoid colon. Sigmoid wall is thickened with moderate surrounding inflammatory change. Appendix is prominent at 10 mm but the inflammatory process does not appear to centered on the appendix. Vascular/Lymphatic: No acute findings. Small right lower quadrant lymph nodes nonpathologic by size criteria Reproductive: Negative Other: Trace right pericolic gutter free fluid Musculoskeletal: No acute findings. Diffuse osteosclerosis likely related to chronic renal disease. IMPRESSION: 1. Findings most consistent with diverticulitis of the cecum. 2. Evidence of polycystic renal disease 3. Cannot exclude pulmonary nodule right lung base. CT thorax without contrast recommended. Electronically Signed   By: Skipper Cliche M.D.   On: 07/21/2015 10:19    Anti-infectives: Anti-infectives    Start     Dose/Rate Route Frequency Ordered Stop   07/22/15 1800  ciprofloxacin (CIPRO) IVPB 400 mg     400 mg 200 mL/hr over 60 Minutes Intravenous Every 24 hours 07/22/15 0828     07/22/15 1200  ciprofloxacin (CIPRO) IVPB 400 mg  Status:  Discontinued     400 mg 200 mL/hr over 60 Minutes Intravenous Every 24 hours 07/21/15 1341 07/22/15 0828   07/21/15 1900  metroNIDAZOLE (FLAGYL) IVPB 500 mg     500 mg 100 mL/hr over 60 Minutes Intravenous Every 8 hours 07/21/15 1330     07/21/15 1030  ciprofloxacin (CIPRO) IVPB 400 mg     400 mg 200 mL/hr over 60 Minutes Intravenous  Once 07/21/15 1026 07/21/15 1244   07/21/15 1030  metroNIDAZOLE (FLAGYL) IVPB 500 mg     500 mg 100 mL/hr over 60 Minutes Intravenous  Once 07/21/15 1026 07/21/15 2146       Assessment/Plan Diverticulitis of the cecum - continue non-op management with abx and pain control FEN: advance diet as tolerated ID:  cipro/flagyl day #3.   DVT proph: Heparin, SCDs Dispo: floor   Leukocytosis - improved ESRD on HD  Hypertension Anemia of chronic disease Sleep apnea - CPAP sometimes complaint Hx of GI bleed 07/2014 Stroke 2016 Chronic headaches    LOS: 2 days    Rosario Adie, MD  [facility]

## 2015-07-23 NOTE — Progress Notes (Signed)
 [facility] Progress Note   Subjective:  Patient in shower. No C/O Pain. Wife at bedside. Still some nausea but no vomiting this AM Able to keep cl liq b'fast down. No BM.    Objective Filed Vitals:   07/22/15 1755 07/22/15 1826 07/22/15 1935 07/23/15 0616  BP: 118/81 111/83 117/80 136/83  Pulse: 98 96 96 95  Temp:  98.3 F (36.8 C) 98.9 F (37.2 C) 98.1 F (36.7 C)  TempSrc:  Oral Oral Oral  Resp:  16 19 19   Height:      Weight:  95.2 kg (209 lb 14.1 oz)    SpO2:  98% 98% 98%   Physical Exam General: NAD Heart: S1, S2, RRR no M/G/R Lungs: clear without rales Abdomen: soft ,tenderness in umbilical area and RUQ of abd. No distention active BS X 4.  Extremities: no LE edema Dialysis Access: right lower AVF + bruit aneurysmal appearance.   Dialysis Orders: Dialysis Orders: Center: [facility] M[facility] 4.5 hours 180 500 A 1.5 EDW 96.5 2K 2 Ca right lower AVF Hectorol 1 heparin 5000 Mircera 50 q 2 last 5/31 Recent labs: hgb 10.3 5/31 21% sat ferritin 1476 iPTH 1242 P 6.6 Ca ok - > 10 at times  Additional Objective Labs: Basic Metabolic Panel:  Recent Labs Lab 07/21/15 0810 07/22/15 0249  NA 138 133*  K 5.0 5.6*  CL 90* 93*  CO2 31 26  GLUCOSE 84 84  BUN 40* 53*  CREATININE 10.02* 11.32*  CALCIUM 8.5* 7.6*  PHOS  --  6.5*   Liver Function Tests:  Recent Labs Lab 07/21/15 0810 07/22/15 0249  AST 16  --   ALT 12*  --   ALKPHOS 78  --   BILITOT 0.4  --   PROT 8.2*  --   ALBUMIN 3.0* 2.5*    Recent Labs Lab 07/21/15 0810  LIPASE 181*   CBC:  Recent Labs Lab 07/21/15 0810 07/22/15 0249  WBC 13.5* 11.2*  NEUTROABS 11.4*  --   HGB 11.8* 10.2*  HCT 37.3* 32.9*  MCV 90.5 88.7  PLT 265 227   Blood Culture    Component Value Date/Time   SDES WOUND GRAFT ARM LEFT 03/25/2009 1500   SDES WOUND GRAFT ARM LEFT 03/25/2009 1500   SPECREQUEST PATIENT ON FOLLOWING ANCEF 03/25/2009 1500   SPECREQUEST PATIENT ON FOLLOWING ANCEF 03/25/2009 1500   CULT  NO ANAEROBES ISOLATED 03/25/2009 1500   CULT NO GROWTH 2 DAYS 03/25/2009 1500   REPTSTATUS 03/30/2009 FINAL 03/25/2009 1500   REPTSTATUS 03/28/2009 FINAL 03/25/2009 1500    Cardiac Enzymes: No results for input(s): CKTOTAL, CKMB, CKMBINDEX, TROPONINI in the last 168 hours. CBG: No results for input(s): GLUCAP in the last 168 hours. Iron Studies: No results for input(s): IRON, TIBC, TRANSFERRIN, FERRITIN in the last 72 hours. @lablastinr3 @ Studies/Results: Ct Chest Wo Contrast  07/22/2015  CLINICAL DATA:  Right lung nodule EXAM: CT CHEST WITHOUT CONTRAST TECHNIQUE: Multidetector CT imaging of the chest was performed following the standard protocol without IV contrast. COMPARISON:  CT abdomen 07/21/2015 FINDINGS: No abnormal mediastinal adenopathy. Three vessel coronary artery calcifications. Minimal aortic arch atherosclerotic calcification. No pericardial effusion. No pneumothorax.  No pleural effusion Tiny visceral pleural node in the right minor fissure corresponds to the abnormality described on the CT. Bibasilar heterogeneous dependent opacities are compatible with volume loss versus patchy pneumonia. Irregular pleural based tiny parenchymal density in the right upper lobe on image 38 is likely scarring. No destructive bone lesion. Mild wedging of  mid-level thoracic vertebral bodies has a chronic appearance. IMPRESSION: The abnormality noted on the CT abdomen towards the right lung base is a sub cm short axis diameter of visceral pleural node and has a benign appearance. Bibasilar patchy atelectasis versus airspace disease. Electronically Signed   By: Marybelle Killings M.D.   On: 07/22/2015 10:27   Ct Abdomen Pelvis W Contrast  07/21/2015  CLINICAL DATA:  Diffuse abdominal pain particularly in the right lower quadrant today EXAM: CT ABDOMEN AND PELVIS WITH CONTRAST TECHNIQUE: Multidetector CT imaging of the abdomen and pelvis was performed using the standard protocol following bolus administration of  intravenous contrast. CONTRAST:  100 mL Isovue-300 COMPARISON:  09/02/2013 FINDINGS: Lower chest: Possible partially visualized pulmonary nodule right lung base on the cranial most image measuring at least 3 mm. This could alternatively represent volume averaging with vascular anatomy. There is mild dependent atelectasis. Hepatobiliary: The liver and gallbladder are normal Pancreas: Pancreas is normal Spleen: Spleen is normal Adrenals/Urinary Tract: Adrenal glands are normal. No hydronephrosis. Innumerable bilateral renal cysts. 2 mm stone midpole left kidney. Bladder decompressed and bladder wall appears thickened. Stomach/Bowel: There is extensive diverticulosis throughout the entire large bowel particularly the cecum, descending colon, and sigmoid colon. Sigmoid wall is thickened with moderate surrounding inflammatory change. Appendix is prominent at 10 mm but the inflammatory process does not appear to centered on the appendix. Vascular/Lymphatic: No acute findings. Small right lower quadrant lymph nodes nonpathologic by size criteria Reproductive: Negative Other: Trace right pericolic gutter free fluid Musculoskeletal: No acute findings. Diffuse osteosclerosis likely related to chronic renal disease. IMPRESSION: 1. Findings most consistent with diverticulitis of the cecum. 2. Evidence of polycystic renal disease 3. Cannot exclude pulmonary nodule right lung base. CT thorax without contrast recommended. Electronically Signed   By: Skipper Cliche M.D.   On: 07/21/2015 10:19   Medications:   . acetaminophen  1,000 mg Oral Q8H  . calcium acetate  2,001 mg Oral TID WC  . cinacalcet  90 mg Oral BID WC  . ciprofloxacin  400 mg Intravenous Q24H  . [START ON 07/27/2015] darbepoetin (ARANESP) injection - DIALYSIS  60 mcg Intravenous Q Wed-HD  . doxercalciferol  1 mcg Intravenous Q M,W,F-HD  . feeding supplement  1 Container Oral BID BM  . gabapentin  100 mg Oral BID  . heparin  5,000 Units Subcutaneous Q8H   . lanthanum  1,000 mg Oral TID WC  . metronidazole  500 mg Intravenous Q8H  . multivitamin  1 tablet Oral Daily    Assessment/Plan: 1. Acute cecal diverticulitis -empiric cipro and flagyl, clear diet WBC 11.2 No fevers. Still nauseated.  2. ESRD - MWF - HD on schedule 06/21/15 next tx Monday. Will write orders in case still in hospital  3. Hypertension/volume - controlled HD06/09/17 pre wt 98.6 kg net UF 2600 Post wt 95.2 kg. Sl. Under EDW. BP Stable. UFG 2.0-2.5 liters Monday if still in hospital.  4. Anemia - hgb 10.2- ESAdue Next week due 6/14 5. Metabolic bone disease - Continue hectorol, sensipar ^ to 90 bid = outpt dose, fosrenol/phoslo ok to hold binders until off CL 6. Nutrition - on FL at present 7. 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 Hx of GIB - never had colonoscopy after GIB in 2016 when hgb was too low to scope 10. New right lung nodule -Chest CT done 07/22/15.  Benign appearance of visceral pleural node, Bibasilar patchy atelectasis versus airspace disease.   H.  NP-C 07/23/2015, 9:27 AM  [employer] [phone]

## 2015-07-23 NOTE — Progress Notes (Signed)
  Date: 07/23/2015  Patient name: Carl Mckittrick Sr.  Medical record number: [id number]  Date of birth: 02/12/1961   This patient has been seen and the plan of care was discussed with the house staff. Please see Dr. Caron Presume note for complete details. I concur with her findings.  Sid Falcon, MD 07/23/2015, 10:53 AM

## 2015-07-23 NOTE — Discharge Instructions (Signed)
Take flagyl 500mg  once tonight then 3 times a day for 7 more days.  Take ciprofloxacin 500mg  once for 7 more days starting tomorrow.

## 2015-07-23 NOTE — Progress Notes (Signed)
Subjective: No acute complaints overnight. RLQ abdominal pain has improved.  Described as dull.  Tolerating clear liquid diet.  Disliked taste of full liquid meal yesterday and became nauseous shortly thereafter.  Patient agrees to trial of soft solid diet this afternoon.  Denies nausea, vomiting, fever, sweats or chills.   Objective: Vital signs in last 24 hours: Filed Vitals:   07/22/15 1755 07/22/15 1826 07/22/15 1935 07/23/15 0616  BP: 118/81 111/83 117/80 136/83  Pulse: 98 96 96 95  Temp:  98.3 F (36.8 C) 98.9 F (37.2 C) 98.1 F (36.7 C)  TempSrc:  Oral Oral Oral  Resp:  16 19 19   Height:      Weight:  95.2 kg (209 lb 14.1 oz)    SpO2:  98% 98% 98%   Weight change: 6 kg (13 lb 3.6 oz)  Intake/Output Summary (Last 24 hours) at 07/23/15 0844 Last data filed at 07/22/15 2146  Gross per 24 hour  Intake    460 ml  Output   3400 ml  Net  -2940 ml   Physical Exam  Constitutional: He is cooperative.  Non-toxic appearance. No distress.  Cardiovascular: Normal rate, regular rhythm and normal heart sounds.   Pulmonary/Chest: Breath sounds normal. No respiratory distress.  Abdominal: Soft. He exhibits no distension.  RLQ tenderness to deep palpation  Neurological: He is alert.   Lab Results: No new labs   Imaging: No new imaging  Medications:  Scheduled Meds: . calcium acetate  2,001 mg Oral TID WC  . cinacalcet  90 mg Oral BID WC  . ciprofloxacin  400 mg Intravenous Q24H  . [START ON 07/27/2015] darbepoetin (ARANESP) injection - DIALYSIS  60 mcg Intravenous Q Wed-HD  . doxercalciferol  1 mcg Intravenous Q M,W,F-HD  . feeding supplement  1 Container Oral BID BM  . gabapentin  100 mg Oral BID  . heparin  5,000 Units Subcutaneous Q8H  . lanthanum  1,000 mg Oral TID WC  . metronidazole  500 mg Intravenous Q8H  . multivitamin  1 tablet Oral Daily   Continuous Infusions:  PRN Meds:.acetaminophen, HYDROmorphone (DILAUDID) injection, promethazine   Problem  List Principal Problem:   Diverticulitis of cecum Active Problems:   GERD (gastroesophageal reflux disease)   ESRD on dialysis ([place])   Anemia of renal disease   HLD (hyperlipidemia)   Diverticulitis of large intestine without perforation or abscess without bleeding   Right lower quadrant pain   Lung nodule  Assessment/Plan: [name] is a 55 year old African American male with a history of diverticulosis, diverticular hemorrhage (2006) and ESRD on HD who presented with RLQ abdominal pain shown to have diverticulitis of the cecum on CT-Abdomen.  Diverticulitis of the Cecum: Hemodynamically stable.  RLQ abdominal pain is improving. PRN IV pain medication only requested about 3 times a day which is reassuring.  Patient should be switched to non-opioid medication to treat pain.  Liver function test on admission were normal. Currently on IV Flagyl and Cipro. -- Continue IV Flagyl and Cipro  -- transition to PO antibiotics when patient tolerates regular diet -- D/c IV Dilaudid 1mg  q4h  -- begin PO Tylenol 1000mg  q8h  Nausea/Vomiting: Pt unable to tolerate full liquid diet yesterday. Disliked the taste of full liquid diet meal yesterday which may have contributed to his nausea/vomiting episode. Currently tolerating clear diet. Diet will be advanced today.  -- continue Phenergan 12.5mg  q6h IV PRN -- Advance diet as tolerated  Pulmonary Nodule: Possible ~3 mm pulmonary nodule  on right lung base incidentally seen on CT-Abdomen. Further workup with CT chest performed due to patient's increased probability for malignancy due to smoking history and age (>60 years old). CT chest found a sub cm short axis diameter of visceral pleural node that has a benign appearance. Follow-up serial CT scans in the outpatient is recommended as findings are benign and patient is at high clinical risk for malignancy.  -- follow-up surveillance CT chest recommended in the outpatient setting  ESRD on HD: Most  recent Cr. 11.2, consistent with baseline. On MWF Hemodialysis schedule. -- continue home dialysis medication: Sensipar 90mg  daily, Fosrenol 1000mg  TID, Phoslo 667mg  TID  Anemia secondary to ESRD- Stable. Most recent Hbg 11.2, consistent with baseline -- continue home DIALYVITE 3mg  daily  Diet: Clear Liquid   PPX -- DVT: IM Heparin  Code Status: FULL  Dispo: Pending tolerance of regular diet. Hemodynamic stability and resolution of abdominal pain and leukocytosis is reassuring.   This is a Careers information officer Note. The care of the patient was discussed with Dr. Hulen Luster and the assessment and plan formulated with their assistance. Please see their attached note for official documentation of the daily encounter.    LOS: 2 days   Garald Braver, Med Student 07/23/2015, 8:44 AM

## 2015-07-23 NOTE — Progress Notes (Signed)
Subjective: Abdominal pain is improving but still having nausea. Vomited yesterday w/ trial of full liquids due to taste and nausea. He would like to try juice this morning and then soft food in the afternoon.   Objective: Vital signs in last 24 hours: Filed Vitals:   07/22/15 1755 07/22/15 1826 07/22/15 1935 07/23/15 0616  BP: 118/81 111/83 117/80 136/83  Pulse: 98 96 96 95  Temp:  98.3 F (36.8 C) 98.9 F (37.2 C) 98.1 F (36.7 C)  TempSrc:  Oral Oral Oral  Resp:  16 19 19   Height:      Weight:  209 lb 14.1 oz (95.2 kg)    SpO2:  98% 98% 98%   Weight change: 13 lb 3.6 oz (6 kg)  Intake/Output Summary (Last 24 hours) at 07/23/15 0809 Last data filed at 07/22/15 2146  Gross per 24 hour  Intake    460 ml  Output   3400 ml  Net  -2940 ml   General: NAD, sitting in recliner at beside.  Lungs: CTAB, no wheezing or crackles Cardiac: RRR, no murmurs, rubs, or gallops GI: soft, active bowel sounds, TTP of RLQ Neuro: CN II-XII grossly intact Skin: warm and dry  Lab Results: Basic Metabolic Panel:  Recent Labs Lab 07/21/15 0810 07/22/15 0249  NA 138 133*  K 5.0 5.6*  CL 90* 93*  CO2 31 26  GLUCOSE 84 84  BUN 40* 53*  CREATININE 10.02* 11.32*  CALCIUM 8.5* 7.6*  PHOS  --  6.5*   Liver Function Tests:  Recent Labs Lab 07/21/15 0810 07/22/15 0249  AST 16  --   ALT 12*  --   ALKPHOS 78  --   BILITOT 0.4  --   PROT 8.2*  --   ALBUMIN 3.0* 2.5*    Recent Labs Lab 07/21/15 0810  LIPASE 181*   CBC:  Recent Labs Lab 07/21/15 0810 07/22/15 0249  WBC 13.5* 11.2*  NEUTROABS 11.4*  --   HGB 11.8* 10.2*  HCT 37.3* 32.9*  MCV 90.5 88.7  PLT 265 227   Studies/Results: Ct Chest Wo Contrast  07/22/2015  CLINICAL DATA:  Right lung nodule EXAM: CT CHEST WITHOUT CONTRAST TECHNIQUE: Multidetector CT imaging of the chest was performed following the standard protocol without IV contrast. COMPARISON:  CT abdomen 07/21/2015 FINDINGS: No abnormal mediastinal  adenopathy. Three vessel coronary artery calcifications. Minimal aortic arch atherosclerotic calcification. No pericardial effusion. No pneumothorax.  No pleural effusion Tiny visceral pleural node in the right minor fissure corresponds to the abnormality described on the CT. Bibasilar heterogeneous dependent opacities are compatible with volume loss versus patchy pneumonia. Irregular pleural based tiny parenchymal density in the right upper lobe on image 38 is likely scarring. No destructive bone lesion. Mild wedging of mid-level thoracic vertebral bodies has a chronic appearance. IMPRESSION: The abnormality noted on the CT abdomen towards the right lung base is a sub cm short axis diameter of visceral pleural node and has a benign appearance. Bibasilar patchy atelectasis versus airspace disease. Electronically Signed   By: Marybelle Killings M.D.   On: 07/22/2015 10:27   Ct Abdomen Pelvis W Contrast  07/21/2015  CLINICAL DATA:  Diffuse abdominal pain particularly in the right lower quadrant today EXAM: CT ABDOMEN AND PELVIS WITH CONTRAST TECHNIQUE: Multidetector CT imaging of the abdomen and pelvis was performed using the standard protocol following bolus administration of intravenous contrast. CONTRAST:  100 mL Isovue-300 COMPARISON:  09/02/2013 FINDINGS: Lower chest: Possible partially visualized pulmonary nodule right  lung base on the cranial most image measuring at least 3 mm. This could alternatively represent volume averaging with vascular anatomy. There is mild dependent atelectasis. Hepatobiliary: The liver and gallbladder are normal Pancreas: Pancreas is normal Spleen: Spleen is normal Adrenals/Urinary Tract: Adrenal glands are normal. No hydronephrosis. Innumerable bilateral renal cysts. 2 mm stone midpole left kidney. Bladder decompressed and bladder wall appears thickened. Stomach/Bowel: There is extensive diverticulosis throughout the entire large bowel particularly the cecum, descending colon, and  sigmoid colon. Sigmoid wall is thickened with moderate surrounding inflammatory change. Appendix is prominent at 10 mm but the inflammatory process does not appear to centered on the appendix. Vascular/Lymphatic: No acute findings. Small right lower quadrant lymph nodes nonpathologic by size criteria Reproductive: Negative Other: Trace right pericolic gutter free fluid Musculoskeletal: No acute findings. Diffuse osteosclerosis likely related to chronic renal disease. IMPRESSION: 1. Findings most consistent with diverticulitis of the cecum. 2. Evidence of polycystic renal disease 3. Cannot exclude pulmonary nodule right lung base. CT thorax without contrast recommended. Electronically Signed   By: Skipper Cliche M.D.   On: 07/21/2015 10:19   Medications: I have reviewed the patient's current medications. Scheduled Meds: . calcium acetate  2,001 mg Oral TID WC  . cinacalcet  90 mg Oral BID WC  . ciprofloxacin  400 mg Intravenous Q24H  . [START ON 07/27/2015] darbepoetin (ARANESP) injection - DIALYSIS  60 mcg Intravenous Q Wed-HD  . doxercalciferol  1 mcg Intravenous Q M,W,F-HD  . feeding supplement  1 Container Oral BID BM  . gabapentin  100 mg Oral BID  . heparin  5,000 Units Subcutaneous Q8H  . lanthanum  1,000 mg Oral TID WC  . metronidazole  500 mg Intravenous Q8H  . multivitamin  1 tablet Oral Daily   Continuous Infusions:  PRN Meds:.acetaminophen, HYDROmorphone (DILAUDID) injection, promethazine Assessment/Plan: Principal Problem:   Diverticulitis of cecum Active Problems:   GERD (gastroesophageal reflux disease)   ESRD on dialysis (HCC)   Anemia of renal disease   HLD (hyperlipidemia)   Diverticulitis of large intestine without perforation or abscess without bleeding   Right lower quadrant pain   Lung nodule   1. Diverticulitis of cecum-- RLQ pain is improving but patient still having nausea.Will try and advance diet today.  - continue IV cipro and flagyl - trial of soft food  for lunch - IV phenergan 12.5mg  q6h prn for nausea - IV dilaudid 1mg  q4h which is not requiring often, changed to tylenol 1000mg  q8 hours.  - CTM and advance diet as tolerated.   2. ESRD MWF HD - nephro following - continue home renal meds  3. Pulmonary nodule-- rt lung base nodule measuring at least 27mm noted CT abd/pelvis which could alternatively represent volume averaging with vascular anatomy. Repeat bed weight yesterday reveals weight has been stable, weighs 222lbs this admission, previously 217lb 3 months ago. CT chest w/o contrast reveals subcentimeter nodule w/ benign apperance.  - will recommend surveillance CT chest in 12 months per Fleischner Criteria on discharge.    DVT ppx-- heparin   Dispo: Disposition is deferred at this time, awaiting improvement of current medical problems.   The patient does not have a current PCP Jamal Maes, MD) and does need an [facility] hospital follow-up appointment after discharge.  The patient does not have transportation limitations that hinder transportation to clinic appointments. .Services Needed at time of discharge: Y = Yes, Blank = No PT:   OT:   RN:   Equipment:   Other:  LOS: 2 days   Norman Herrlich, MD 07/23/2015, 8:09 AM

## 2015-07-23 NOTE — Discharge Summary (Signed)
Name: Carl Mellis Sr. MRN: [id number] DOB: February 14, 1960 55 y.o. PCP: Jamal Maes, MD  Date of Admission: 07/21/2015  6:59 AM Date of Discharge: 07/23/2015 Attending Physician: Dr. Daryll Drown  Discharge Diagnosis: Principal Problem:   Diverticulitis of cecum Active Problems:   GERD (gastroesophageal reflux disease)   ESRD on dialysis ([place])   Anemia of renal disease   HLD (hyperlipidemia)   Diverticulitis of large intestine without perforation or abscess without bleeding   Right lower quadrant pain   Lung nodule  Discharge Medications:   Medication List    TAKE these medications        aspirin EC 81 MG tablet  Take 1 tablet (81 mg total) by mouth daily.     atorvastatin 10 MG tablet  Commonly known as:  LIPITOR  Take 1 tablet (10 mg total) by mouth daily at 6 PM.     butalbital-acetaminophen-caffeine 50-325-40 MG tablet  Commonly known as:  FIORICET  Take 1 tablet by mouth every 6 (six) hours as needed for headache.     calcium acetate 667 MG capsule  Commonly known as:  PHOSLO  Take 2,001 mg by mouth 3 (three) times daily with meals.     cinacalcet 90 MG tablet  Commonly known as:  SENSIPAR  Take 90 mg by mouth daily.     ciprofloxacin 500 MG tablet  Commonly known as:  CIPRO  Take 1 tablet (500 mg total) by mouth daily.     cromolyn 4 % ophthalmic solution  Commonly known as:  OPTICROM  4 (four) times daily.     folic acid-vitamin b complex-vitamin c-selenium-zinc 3 MG Tabs tablet  Take 1 tablet by mouth daily.     gabapentin 100 MG capsule  Commonly known as:  NEURONTIN  Take 100 mg by mouth 2 (two) times daily.     ibuprofen 800 MG tablet  Commonly known as:  ADVIL,MOTRIN  Take 800 mg by mouth 2 (two) times daily.     lanthanum 1000 MG chewable tablet  Commonly known as:  FOSRENOL  Chew 1,000 mg by mouth 3 (three) times daily with meals.     metroNIDAZOLE 500 MG tablet  Commonly known as:  FLAGYL  Take 1 tablet (500 mg total) by mouth 3 (three)  times daily.     MULTI VITAMIN MENS PO  Take 1 tablet by mouth daily.     sorbitol 70 % solution  Take 15-60 mLs by mouth daily as needed (dialysis days).        Disposition and follow-up:   Carl Lye Sr. was discharged from [facility] in Good condition.  At the hospital follow up visit please address:  1.  Please ensure pt completed his antibiotics.   2.  Labs / imaging needed at time of follow-up: none  3.  Pending labs/ test needing follow-up: none  Follow-up Appointments:   Discharge Instructions:   Consultations: Treatment Team:  Md Ccs, MD Jamal Maes, MD  Procedures Performed:  Ct Chest Wo Contrast  07/22/2015  CLINICAL DATA:  Right lung nodule EXAM: CT CHEST WITHOUT CONTRAST TECHNIQUE: Multidetector CT imaging of the chest was performed following the standard protocol without IV contrast. COMPARISON:  CT abdomen 07/21/2015 FINDINGS: No abnormal mediastinal adenopathy. Three vessel coronary artery calcifications. Minimal aortic arch atherosclerotic calcification. No pericardial effusion. No pneumothorax.  No pleural effusion Tiny visceral pleural node in the right minor fissure corresponds to the abnormality described on the CT. Bibasilar heterogeneous dependent opacities are compatible  with volume loss versus patchy pneumonia. Irregular pleural based tiny parenchymal density in the right upper lobe on image 38 is likely scarring. No destructive bone lesion. Mild wedging of mid-level thoracic vertebral bodies has a chronic appearance. IMPRESSION: The abnormality noted on the CT abdomen towards the right lung base is a sub cm short axis diameter of visceral pleural node and has a benign appearance. Bibasilar patchy atelectasis versus airspace disease. Electronically Signed   By: Marybelle Killings M.D.   On: 07/22/2015 10:27   Ct Abdomen Pelvis W Contrast  07/21/2015  CLINICAL DATA:  Diffuse abdominal pain particularly in the right lower quadrant today  EXAM: CT ABDOMEN AND PELVIS WITH CONTRAST TECHNIQUE: Multidetector CT imaging of the abdomen and pelvis was performed using the standard protocol following bolus administration of intravenous contrast. CONTRAST:  100 mL Isovue-300 COMPARISON:  09/02/2013 FINDINGS: Lower chest: Possible partially visualized pulmonary nodule right lung base on the cranial most image measuring at least 3 mm. This could alternatively represent volume averaging with vascular anatomy. There is mild dependent atelectasis. Hepatobiliary: The liver and gallbladder are normal Pancreas: Pancreas is normal Spleen: Spleen is normal Adrenals/Urinary Tract: Adrenal glands are normal. No hydronephrosis. Innumerable bilateral renal cysts. 2 mm stone midpole left kidney. Bladder decompressed and bladder wall appears thickened. Stomach/Bowel: There is extensive diverticulosis throughout the entire large bowel particularly the cecum, descending colon, and sigmoid colon. Sigmoid wall is thickened with moderate surrounding inflammatory change. Appendix is prominent at 10 mm but the inflammatory process does not appear to centered on the appendix. Vascular/Lymphatic: No acute findings. Small right lower quadrant lymph nodes nonpathologic by size criteria Reproductive: Negative Other: Trace right pericolic gutter free fluid Musculoskeletal: No acute findings. Diffuse osteosclerosis likely related to chronic renal disease. IMPRESSION: 1. Findings most consistent with diverticulitis of the cecum. 2. Evidence of polycystic renal disease 3. Cannot exclude pulmonary nodule right lung base. CT thorax without contrast recommended. Electronically Signed   By: Skipper Cliche M.D.   On: 07/21/2015 10:19     Admission HPI: 51M w/ PMHx of diverticulitis and ESRD on HD who presents w/ RLQ pain that started last night after eating at [place]. He had RLQ pain that he rates at 9/10 in severity and is non radiating. Movement worsens pain, he has not tried any  medication to help w/ pain at home. After eating he felt that food was stuck in his chest and he vomited. He then went to sleep and woke up with pain around 6:00am this morning and thus decided to go to the ED. He denies fevers, NS, chills, rashes, and hematochezia. He was able to tolerate ice chips in the ED. A CT abd/pelvis revealed extensive diverticulosis throughout the entire large bowel particularly in the cecum, descending colon, and sigmoid colon. There was a prominent 109mm appendix but the inflammatory process did not appear to be centered on the appendix. Surgery was consulted and recommend observation in case of appendicitis.   Pt had a colonoscopy in 07/18/2010 at which time he was noted to have moderate diverticulosis sigmoid to ascending colon. He was last admitted in May 2016 for a GI bleed thought to be secondary to diverticular hemorrhage. His hgb was 6.1 and he refused blood transfusion due to religious beliefs but was open to IV iron.   Hospital Course by problem list: Principal Problem:   Diverticulitis of cecum Active Problems:   GERD (gastroesophageal reflux disease)   ESRD on dialysis (HCC)   Anemia of renal disease  HLD (hyperlipidemia)   Diverticulitis of large intestine without perforation or abscess without bleeding   Right lower quadrant pain   Lung nodule   Diverticulitis of the Cecum On initial evaluation patient was afebrile, hemodynamically stable and non-toxic appearing. Found to have RLQ tenderness to deep palpation with no signs of acute abdomen.  WBC and Lipase were elevated to 13.5 and 181 respectively.  CT abdomen was consistent with diverticulitis of the cecum.   Surgery consulted due to concern for appendicitis and recommended observation. Patient was started on IV Flagyl 500mg  and Cipro 400mg  and placed on clears. Once he was able to tolerate a soft diet he was discharged home w/ total of 14 days of cipro and flaygl.   Lung nodule: Right lung base nodule  measuring at least 39mm noted CT abd/pelvis which could alternatively represent volume averaging with vascular anatomy. CT chest w/o contrast reveals subcentimeter nodule w/ benign apperance. Will recommend surveillance CT chest in 12 months per Fleischner Criteria.   Discharge Vitals:   BP 112/78 mmHg  Pulse 79  Temp(Src) 97.8 F (36.6 C) (Oral)  Resp 19  Ht 6\' 1"  (1.854 m)  Wt 209 lb 14.1 oz (95.2 kg)  BMI 27.70 kg/m2  SpO2 100%  Discharge Labs:  No results found for this or any previous visit (from the past 24 hour(s)).  Signed: Norman Herrlich, MD 07/23/2015, 5:01 PM    Services Ordered on Discharge: none Equipment Ordered on Discharge: none

## 2015-07-25 DIAGNOSIS — N2581 Secondary hyperparathyroidism of renal origin: Secondary | ICD-10-CM | POA: Diagnosis not present

## 2015-07-25 DIAGNOSIS — N186 End stage renal disease: Secondary | ICD-10-CM | POA: Diagnosis not present

## 2015-07-25 DIAGNOSIS — D631 Anemia in chronic kidney disease: Secondary | ICD-10-CM | POA: Diagnosis not present

## 2015-07-25 DIAGNOSIS — D509 Iron deficiency anemia, unspecified: Secondary | ICD-10-CM | POA: Diagnosis not present

## 2015-07-25 DIAGNOSIS — E1129 Type 2 diabetes mellitus with other diabetic kidney complication: Secondary | ICD-10-CM | POA: Diagnosis not present

## 2015-07-25 DIAGNOSIS — E875 Hyperkalemia: Secondary | ICD-10-CM | POA: Diagnosis not present

## 2015-07-27 DIAGNOSIS — N2581 Secondary hyperparathyroidism of renal origin: Secondary | ICD-10-CM | POA: Diagnosis not present

## 2015-07-27 DIAGNOSIS — D631 Anemia in chronic kidney disease: Secondary | ICD-10-CM | POA: Diagnosis not present

## 2015-07-27 DIAGNOSIS — E1129 Type 2 diabetes mellitus with other diabetic kidney complication: Secondary | ICD-10-CM | POA: Diagnosis not present

## 2015-07-27 DIAGNOSIS — D509 Iron deficiency anemia, unspecified: Secondary | ICD-10-CM | POA: Diagnosis not present

## 2015-07-27 DIAGNOSIS — E875 Hyperkalemia: Secondary | ICD-10-CM | POA: Diagnosis not present

## 2015-07-27 DIAGNOSIS — N186 End stage renal disease: Secondary | ICD-10-CM | POA: Diagnosis not present

## 2015-07-29 DIAGNOSIS — D631 Anemia in chronic kidney disease: Secondary | ICD-10-CM | POA: Diagnosis not present

## 2015-07-29 DIAGNOSIS — N186 End stage renal disease: Secondary | ICD-10-CM | POA: Diagnosis not present

## 2015-07-29 DIAGNOSIS — D509 Iron deficiency anemia, unspecified: Secondary | ICD-10-CM | POA: Diagnosis not present

## 2015-07-29 DIAGNOSIS — N2581 Secondary hyperparathyroidism of renal origin: Secondary | ICD-10-CM | POA: Diagnosis not present

## 2015-07-29 DIAGNOSIS — E1129 Type 2 diabetes mellitus with other diabetic kidney complication: Secondary | ICD-10-CM | POA: Diagnosis not present

## 2015-07-29 DIAGNOSIS — E875 Hyperkalemia: Secondary | ICD-10-CM | POA: Diagnosis not present

## 2015-08-01 ENCOUNTER — Emergency Department (HOSPITAL_COMMUNITY): Payer: Medicare Other

## 2015-08-01 ENCOUNTER — Other Ambulatory Visit: Payer: Self-pay

## 2015-08-01 ENCOUNTER — Inpatient Hospital Stay (HOSPITAL_COMMUNITY)
Admission: EM | Admit: 2015-08-01 | Discharge: 2015-08-10 | DRG: 329 | Disposition: A | Discharge status: Home / Self Care | Payer: Medicare Other | Attending: Internal Medicine | Admitting: Internal Medicine

## 2015-08-01 ENCOUNTER — Encounter (HOSPITAL_COMMUNITY): Payer: Self-pay | Admitting: Emergency Medicine

## 2015-08-01 DIAGNOSIS — K219 Gastro-esophageal reflux disease without esophagitis: Secondary | ICD-10-CM | POA: Diagnosis not present

## 2015-08-01 DIAGNOSIS — K567 Ileus, unspecified: Secondary | ICD-10-CM | POA: Diagnosis not present

## 2015-08-01 DIAGNOSIS — Z885 Allergy status to narcotic agent status: Secondary | ICD-10-CM

## 2015-08-01 DIAGNOSIS — K9189 Other postprocedural complications and disorders of digestive system: Secondary | ICD-10-CM | POA: Diagnosis not present

## 2015-08-01 DIAGNOSIS — K633 Ulcer of intestine: Principal | ICD-10-CM | POA: Diagnosis present

## 2015-08-01 DIAGNOSIS — G4733 Obstructive sleep apnea (adult) (pediatric): Secondary | ICD-10-CM | POA: Diagnosis present

## 2015-08-01 DIAGNOSIS — N189 Chronic kidney disease, unspecified: Secondary | ICD-10-CM | POA: Diagnosis not present

## 2015-08-01 DIAGNOSIS — R579 Shock, unspecified: Secondary | ICD-10-CM | POA: Diagnosis not present

## 2015-08-01 DIAGNOSIS — D500 Iron deficiency anemia secondary to blood loss (chronic): Secondary | ICD-10-CM | POA: Diagnosis not present

## 2015-08-01 DIAGNOSIS — D631 Anemia in chronic kidney disease: Secondary | ICD-10-CM | POA: Diagnosis present

## 2015-08-01 DIAGNOSIS — K5730 Diverticulosis of large intestine without perforation or abscess without bleeding: Secondary | ICD-10-CM | POA: Diagnosis not present

## 2015-08-01 DIAGNOSIS — I63312 Cerebral infarction due to thrombosis of left middle cerebral artery: Secondary | ICD-10-CM | POA: Diagnosis not present

## 2015-08-01 DIAGNOSIS — K6389 Other specified diseases of intestine: Secondary | ICD-10-CM | POA: Diagnosis not present

## 2015-08-01 DIAGNOSIS — N186 End stage renal disease: Secondary | ICD-10-CM | POA: Diagnosis not present

## 2015-08-01 DIAGNOSIS — K5791 Diverticulosis of intestine, part unspecified, without perforation or abscess with bleeding: Secondary | ICD-10-CM

## 2015-08-01 DIAGNOSIS — D649 Anemia, unspecified: Secondary | ICD-10-CM | POA: Diagnosis not present

## 2015-08-01 DIAGNOSIS — Z8049 Family history of malignant neoplasm of other genital organs: Secondary | ICD-10-CM

## 2015-08-01 DIAGNOSIS — I959 Hypotension, unspecified: Secondary | ICD-10-CM | POA: Diagnosis not present

## 2015-08-01 DIAGNOSIS — R0902 Hypoxemia: Secondary | ICD-10-CM | POA: Diagnosis present

## 2015-08-01 DIAGNOSIS — E861 Hypovolemia: Secondary | ICD-10-CM | POA: Diagnosis present

## 2015-08-01 DIAGNOSIS — Z992 Dependence on renal dialysis: Secondary | ICD-10-CM

## 2015-08-01 DIAGNOSIS — R0602 Shortness of breath: Secondary | ICD-10-CM | POA: Diagnosis not present

## 2015-08-01 DIAGNOSIS — G038 Meningitis due to other specified causes: Secondary | ICD-10-CM | POA: Diagnosis present

## 2015-08-01 DIAGNOSIS — Z8673 Personal history of transient ischemic attack (TIA), and cerebral infarction without residual deficits: Secondary | ICD-10-CM | POA: Diagnosis not present

## 2015-08-01 DIAGNOSIS — I12 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 stage renal disease: Secondary | ICD-10-CM | POA: Diagnosis present

## 2015-08-01 DIAGNOSIS — K922 Gastrointestinal hemorrhage, unspecified: Secondary | ICD-10-CM | POA: Diagnosis present

## 2015-08-01 DIAGNOSIS — N2581 Secondary hyperparathyroidism of renal origin: Secondary | ICD-10-CM | POA: Diagnosis present

## 2015-08-01 DIAGNOSIS — Z87891 Personal history of nicotine dependence: Secondary | ICD-10-CM

## 2015-08-01 DIAGNOSIS — K644 Residual hemorrhoidal skin tags: Secondary | ICD-10-CM | POA: Diagnosis present

## 2015-08-01 DIAGNOSIS — G629 Polyneuropathy, unspecified: Secondary | ICD-10-CM | POA: Diagnosis present

## 2015-08-01 DIAGNOSIS — Z823 Family history of stroke: Secondary | ICD-10-CM

## 2015-08-01 DIAGNOSIS — D122 Benign neoplasm of ascending colon: Secondary | ICD-10-CM | POA: Diagnosis present

## 2015-08-01 DIAGNOSIS — K631 Perforation of intestine (nontraumatic): Secondary | ICD-10-CM | POA: Diagnosis not present

## 2015-08-01 DIAGNOSIS — D62 Acute posthemorrhagic anemia: Secondary | ICD-10-CM | POA: Diagnosis not present

## 2015-08-01 DIAGNOSIS — G8918 Other acute postprocedural pain: Secondary | ICD-10-CM

## 2015-08-01 DIAGNOSIS — K625 Hemorrhage of anus and rectum: Secondary | ICD-10-CM | POA: Diagnosis not present

## 2015-08-01 DIAGNOSIS — Z8249 Family history of ischemic heart disease and other diseases of the circulatory system: Secondary | ICD-10-CM | POA: Diagnosis not present

## 2015-08-01 DIAGNOSIS — Z7682 Awaiting organ transplant status: Secondary | ICD-10-CM

## 2015-08-01 DIAGNOSIS — Z888 Allergy status to other drugs, medicaments and biological substances status: Secondary | ICD-10-CM

## 2015-08-01 DIAGNOSIS — R109 Unspecified abdominal pain: Secondary | ICD-10-CM | POA: Diagnosis not present

## 2015-08-01 DIAGNOSIS — I10 Essential (primary) hypertension: Secondary | ICD-10-CM | POA: Diagnosis present

## 2015-08-01 DIAGNOSIS — K5732 Diverticulitis of large intestine without perforation or abscess without bleeding: Secondary | ICD-10-CM | POA: Diagnosis not present

## 2015-08-01 DIAGNOSIS — K658 Other peritonitis: Secondary | ICD-10-CM | POA: Diagnosis not present

## 2015-08-01 DIAGNOSIS — K648 Other hemorrhoids: Secondary | ICD-10-CM | POA: Diagnosis present

## 2015-08-01 DIAGNOSIS — D374 Neoplasm of uncertain behavior of colon: Secondary | ICD-10-CM | POA: Insufficient documentation

## 2015-08-01 DIAGNOSIS — K5792 Diverticulitis of intestine, part unspecified, without perforation or abscess without bleeding: Secondary | ICD-10-CM | POA: Diagnosis not present

## 2015-08-01 DIAGNOSIS — I639 Cerebral infarction, unspecified: Secondary | ICD-10-CM | POA: Diagnosis present

## 2015-08-01 DIAGNOSIS — I998 Other disorder of circulatory system: Secondary | ICD-10-CM | POA: Diagnosis not present

## 2015-08-01 DIAGNOSIS — K660 Peritoneal adhesions (postprocedural) (postinfection): Secondary | ICD-10-CM | POA: Diagnosis not present

## 2015-08-01 DIAGNOSIS — K921 Melena: Secondary | ICD-10-CM | POA: Diagnosis not present

## 2015-08-01 LAB — COMPREHENSIVE METABOLIC PANEL
ALT: 11 U/L — ABNORMAL LOW (ref 17–63)
AST: 14 U/L — ABNORMAL LOW (ref 15–41)
Albumin: 2.2 g/dL — ABNORMAL LOW (ref 3.5–5.0)
Alkaline Phosphatase: 61 U/L (ref 38–126)
Anion gap: 13 (ref 5–15)
BUN: 57 mg/dL — ABNORMAL HIGH (ref 6–20)
CO2: 22 mmol/L (ref 22–32)
Calcium: 7.9 mg/dL — ABNORMAL LOW (ref 8.9–10.3)
Chloride: 102 mmol/L (ref 101–111)
Creatinine, Ser: 12.03 mg/dL — ABNORMAL HIGH (ref 0.61–1.24)
GFR calc Af Amer: 5 mL/min — ABNORMAL LOW (ref >60)
GFR calc non Af Amer: 4 mL/min — ABNORMAL LOW (ref >60)
Glucose, Bld: 126 mg/dL — ABNORMAL HIGH (ref 65–99)
Potassium: 4.1 mmol/L (ref 3.5–5.1)
Sodium: 137 mmol/L (ref 135–145)
Total Bilirubin: 0.2 mg/dL — ABNORMAL LOW (ref 0.3–1.2)
Total Protein: 6 g/dL — ABNORMAL LOW (ref 6.5–8.1)

## 2015-08-01 LAB — CBC WITH DIFFERENTIAL/PLATELET
Basophils Absolute: 0.1 10*3/uL (ref 0.0–0.1)
Basophils Relative: 0 %
Eosinophils Absolute: 0.4 10*3/uL (ref 0.0–0.7)
Eosinophils Relative: 2 %
HCT: 24.6 % — ABNORMAL LOW (ref 39.0–52.0)
Hemoglobin: 7.6 g/dL — ABNORMAL LOW (ref 13.0–17.0)
Lymphocytes Relative: 13 %
Lymphs Abs: 2 10*3/uL (ref 0.7–4.0)
MCH: 27.6 pg (ref 26.0–34.0)
MCHC: 30.9 g/dL (ref 30.0–36.0)
MCV: 89.5 fL (ref 78.0–100.0)
Monocytes Absolute: 0.7 10*3/uL (ref 0.1–1.0)
Monocytes Relative: 5 %
Neutro Abs: 12.2 10*3/uL — ABNORMAL HIGH (ref 1.7–7.7)
Neutrophils Relative %: 80 %
Platelets: 344 10*3/uL (ref 150–400)
RBC: 2.75 MIL/uL — ABNORMAL LOW (ref 4.22–5.81)
RDW: 16.8 % — ABNORMAL HIGH (ref 11.5–15.5)
WBC: 15.3 10*3/uL — ABNORMAL HIGH (ref 4.0–10.5)

## 2015-08-01 LAB — CBC
HCT: 19.9 % — ABNORMAL LOW (ref 39.0–52.0)
HCT: 20.7 % — ABNORMAL LOW (ref 39.0–52.0)
HCT: 21.9 % — ABNORMAL LOW (ref 39.0–52.0)
HCT: 22.8 % — ABNORMAL LOW (ref 39.0–52.0)
Hemoglobin: 6.2 g/dL — CL (ref 13.0–17.0)
Hemoglobin: 6.6 g/dL — CL (ref 13.0–17.0)
Hemoglobin: 6.8 g/dL — CL (ref 13.0–17.0)
Hemoglobin: 7.3 g/dL — ABNORMAL LOW (ref 13.0–17.0)
MCH: 27.7 pg (ref 26.0–34.0)
MCH: 27.9 pg (ref 26.0–34.0)
MCH: 28.2 pg (ref 26.0–34.0)
MCH: 28.6 pg (ref 26.0–34.0)
MCHC: 31.1 g/dL (ref 30.0–36.0)
MCHC: 31.2 g/dL (ref 30.0–36.0)
MCHC: 31.9 g/dL (ref 30.0–36.0)
MCHC: 32 g/dL (ref 30.0–36.0)
MCV: 88 fL (ref 78.0–100.0)
MCV: 88.8 fL (ref 78.0–100.0)
MCV: 89.6 fL (ref 78.0–100.0)
MCV: 89.8 fL (ref 78.0–100.0)
Platelets: 211 10*3/uL (ref 150–400)
Platelets: 259 10*3/uL (ref 150–400)
Platelets: 266 10*3/uL (ref 150–400)
Platelets: 273 10*3/uL (ref 150–400)
RBC: 2.24 MIL/uL — ABNORMAL LOW (ref 4.22–5.81)
RBC: 2.31 MIL/uL — ABNORMAL LOW (ref 4.22–5.81)
RBC: 2.44 MIL/uL — ABNORMAL LOW (ref 4.22–5.81)
RBC: 2.59 MIL/uL — ABNORMAL LOW (ref 4.22–5.81)
RDW: 16 % — ABNORMAL HIGH (ref 11.5–15.5)
RDW: 16.7 % — ABNORMAL HIGH (ref 11.5–15.5)
RDW: 16.7 % — ABNORMAL HIGH (ref 11.5–15.5)
RDW: 17 % — ABNORMAL HIGH (ref 11.5–15.5)
WBC: 10.9 10*3/uL — ABNORMAL HIGH (ref 4.0–10.5)
WBC: 13.4 10*3/uL — ABNORMAL HIGH (ref 4.0–10.5)
WBC: 15.2 10*3/uL — ABNORMAL HIGH (ref 4.0–10.5)
WBC: 9.1 10*3/uL (ref 4.0–10.5)

## 2015-08-01 LAB — TROPONIN I: Troponin I: 0.03 ng/mL (ref <0.031)

## 2015-08-01 LAB — I-STAT CHEM 8, ED
BUN: 54 mg/dL — ABNORMAL HIGH (ref 6–20)
Calcium, Ion: 1.01 mmol/L — ABNORMAL LOW (ref 1.12–1.23)
Chloride: 99 mmol/L — ABNORMAL LOW (ref 101–111)
Creatinine, Ser: 12.6 mg/dL — ABNORMAL HIGH (ref 0.61–1.24)
Glucose, Bld: 123 mg/dL — ABNORMAL HIGH (ref 65–99)
HCT: 24 % — ABNORMAL LOW (ref 39.0–52.0)
Hemoglobin: 8.2 g/dL — ABNORMAL LOW (ref 13.0–17.0)
Potassium: 4 mmol/L (ref 3.5–5.1)
Sodium: 138 mmol/L (ref 135–145)
TCO2: 26 mmol/L (ref 0–100)

## 2015-08-01 LAB — PROTIME-INR
INR: 1.14 (ref 0.00–1.49)
Prothrombin Time: 14.8 seconds (ref 11.6–15.2)

## 2015-08-01 LAB — POC OCCULT BLOOD, ED: Fecal Occult Bld: POSITIVE — AB

## 2015-08-01 LAB — I-STAT CG4 LACTIC ACID, ED
Lactic Acid, Venous: 1.61 mmol/L (ref 0.5–2.0)
Lactic Acid, Venous: 1.19 mmol/L (ref 0.5–2.0)
Lactic Acid, Venous: 1.37 mmol/L (ref 0.5–2.0)

## 2015-08-01 LAB — GLUCOSE, CAPILLARY: Glucose-Capillary: 90 mg/dL (ref 65–99)

## 2015-08-01 LAB — PREPARE RBC (CROSSMATCH)

## 2015-08-01 LAB — LIPASE, BLOOD: Lipase: 124 U/L — ABNORMAL HIGH (ref 11–51)

## 2015-08-01 LAB — MRSA PCR SCREENING: MRSA by PCR: NEGATIVE

## 2015-08-01 MED ORDER — DOXERCALCIFEROL 4 MCG/2ML IV SOLN
2.0000 ug | INTRAVENOUS | Status: DC
  Administered 2015-08-03 – 2015-08-10 (×3): 2 ug via INTRAVENOUS
  Filled 2015-08-01 (×4): qty 2

## 2015-08-01 MED ORDER — SODIUM CHLORIDE 0.9 % IV SOLN
510.0000 mg | INTRAVENOUS | Status: DC
  Administered 2015-08-01: 510 mg via INTRAVENOUS
  Filled 2015-08-01 (×2): qty 17

## 2015-08-01 MED ORDER — DARBEPOETIN ALFA 100 MCG/0.5ML IJ SOSY: 100.0000 ug | PREFILLED_SYRINGE | INTRAMUSCULAR | Status: DC

## 2015-08-01 MED ORDER — SODIUM CHLORIDE 0.9 % IV SOLN
Freq: Once | INTRAVENOUS | Status: AC
  Administered 2015-08-01: 07:00:00 via INTRAVENOUS

## 2015-08-01 MED ORDER — SODIUM CHLORIDE 0.9 % IV SOLN: 250.0000 mL | INTRAVENOUS | Status: DC | PRN

## 2015-08-01 MED ORDER — FOLIC ACID 5 MG/ML IJ SOLN
1.0000 mg | Freq: Every day | INTRAMUSCULAR | Status: DC
  Administered 2015-08-01 – 2015-08-07 (×7): 1 mg via INTRAVENOUS
  Filled 2015-08-01 (×10): qty 0.2

## 2015-08-01 MED ORDER — SODIUM CHLORIDE 0.9 % IV BOLUS (SEPSIS)
1000.0000 mL | Freq: Once | INTRAVENOUS | Status: AC
  Administered 2015-08-01: 1000 mL via INTRAVENOUS

## 2015-08-01 MED ORDER — PIPERACILLIN-TAZOBACTAM 3.375 G IVPB 30 MIN
3.3750 g | Freq: Once | INTRAVENOUS | Status: AC
  Administered 2015-08-01: 3.375 g via INTRAVENOUS
  Filled 2015-08-01: qty 50

## 2015-08-01 MED ORDER — PANTOPRAZOLE SODIUM 40 MG IV SOLR
40.0000 mg | Freq: Two times a day (BID) | INTRAVENOUS | Status: DC
  Administered 2015-08-01 – 2015-08-02 (×4): 40 mg via INTRAVENOUS
  Filled 2015-08-01 (×6): qty 40

## 2015-08-01 MED ORDER — SODIUM CHLORIDE 0.9 % IV SOLN
20.0000 ug | Freq: Once | INTRAVENOUS | Status: AC
  Administered 2015-08-01: 20 ug via INTRAVENOUS
  Filled 2015-08-01: qty 5

## 2015-08-01 MED ORDER — IOPAMIDOL (ISOVUE-300) INJECTION 61%
INTRAVENOUS | Status: AC
  Administered 2015-08-01: 100 mL
  Filled 2015-08-01: qty 100

## 2015-08-01 MED ORDER — SODIUM CHLORIDE 0.9 % IV SOLN: Freq: Once | INTRAVENOUS | Status: DC

## 2015-08-01 MED ORDER — PIPERACILLIN-TAZOBACTAM IN DEX 2-0.25 GM/50ML IV SOLN
2.2500 g | Freq: Three times a day (TID) | INTRAVENOUS | Status: DC
  Administered 2015-08-01 – 2015-08-02 (×3): 2.25 g via INTRAVENOUS
  Filled 2015-08-01 (×4): qty 50

## 2015-08-01 NOTE — ED Notes (Signed)
Pt experienced rectal bleeding/blood in stool approx 0200 this morning while using the bathroom. Son reports pt passed out. May have struck his head. Due for dialysis this AM.

## 2015-08-01 NOTE — ED Provider Notes (Signed)
CSN: [id number]     Arrival date & time 08/01/15  0538 History   First MD Initiated Contact with Patient 08/01/15 [phone]     Chief Complaint  Patient presents with  . Rectal Bleeding  . Loss of Consciousness     (Consider location/radiation/quality/duration/timing/severity/associated sxs/prior Treatment) HPI Comments: Patient is a 55 year old male with a history of CKD and diverticular disease who presents with rectal bleeding and syncope. Patient was recently hospitalized for diverticulitis on 07/21/15. Patient reports he was making good recovery and eating a mild diet until he ate out yesterday evening. Patient woke up at 2 AM this morning and had 5 very loose bloody bowel movements. Patient states that he blacked out following the fifth bowel movement and his son stated that he hit his head, not hard, on the bathroom wall. Patient states he has had some shortness of breath on exertion today. Patient continues to have right upper quadrant pain which is the same as he had when he was admitted to the hospital. Patient denies any nausea or vomiting. Patient is a dialysis patient and gives dialysis MWF. He was scheduled to have dialysis at 6:30 AM this morning. Patient has taken his normal medication at home including an aspirin. Patient denies any chest pain. Patient does not make urine regularly.  Patient is a 55 y.o. male presenting with hematochezia and syncope. The history is provided by the patient.  Rectal Bleeding Associated symptoms: abdominal pain   Associated symptoms: no fever and no vomiting   Loss of Consciousness Associated symptoms: shortness of breath   Associated symptoms: no chest pain, no fever, no headaches, no nausea and no vomiting     Past Medical History  Diagnosis Date  . Hypertension   . Anemia   . Chronic headaches   . Sleep apnea   . Chronic kidney disease     Chronic Renal Failure; On Renal Transplant List  . Presence of arteriovenous fistula for hemodialysis,  primary ([place])     RUE  . Hemodialysis patient ([place])     M-W-F  . Stroke ([place])   . GI bleed   . Diverticulitis   . Refusal of blood product     NO WHOLE BLOOD PROUCTS   Past Surgical History  Procedure Laterality Date  . Av fistula placement Right     right arm  . Graft left arm Left     for dialysis x 2  . Insertion of dialysis catheter      Rt chest   Family History  Problem Relation Age of Onset  . Diabetes Father   . Stroke Father   . Hypertension Father   . Uterine cancer Mother   . Lupus Sister   . Stroke Sister   . Anuerysm Brother     brain  . Hypertension Sister    Social History  Substance Use Topics  . Smoking status: Former Smoker    Quit date: 06/19/1990  . Smokeless tobacco: Never Used  . Alcohol Use: 0.6 oz/week    1 Cans of beer per week     Comment: occasional    Review of Systems  Constitutional: Negative for fever and chills.  HENT: Negative for facial swelling and sore throat.   Respiratory: Positive for shortness of breath.   Cardiovascular: Positive for syncope. Negative for chest pain.  Gastrointestinal: Positive for abdominal pain, diarrhea, blood in stool and hematochezia. Negative for nausea and vomiting.  Genitourinary: Negative for dysuria.  Musculoskeletal: Negative for back pain.  Skin: Negative for rash and wound.  Neurological: Negative for headaches.  Psychiatric/Behavioral: The patient is not nervous/anxious.       Allergies  Infed; Oxycodone; Vicodin; Diclofenac; and Whole blood  Home Medications   Prior to Admission medications   Medication Sig Start Date End Date Taking? Authorizing Provider  aspirin EC 81 MG tablet Take 1 tablet (81 mg total) by mouth daily. 08/26/14  Yes Rosalin Hawking, MD  atorvastatin (LIPITOR) 10 MG tablet Take 1 tablet (10 mg total) by mouth daily at 6 PM. 05/25/14  Yes Thurnell Lose, MD  calcium acetate (PHOSLO) 667 MG capsule Take 2,001 mg by mouth 3 (three) times daily with meals.    Yes  Historical Provider, MD  cinacalcet (SENSIPAR) 90 MG tablet Take 90 mg by mouth daily.   Yes Historical Provider, MD  folic acid-vitamin b complex-vitamin c-selenium-zinc (DIALYVITE) 3 MG TABS tablet Take 1 tablet by mouth daily.   Yes Historical Provider, MD  gabapentin (NEURONTIN) 100 MG capsule Take 100 mg by mouth 2 (two) times daily.  09/29/14  Yes Historical Provider, MD  ibuprofen (ADVIL,MOTRIN) 800 MG tablet Take 800 mg by mouth 2 (two) times daily.   Yes Historical Provider, MD  lanthanum (FOSRENOL) 1000 MG chewable tablet Chew 1,000 mg by mouth 3 (three) times daily with meals.   Yes Historical Provider, MD  sorbitol 70 % solution Take 15-60 mLs by mouth daily as needed (dialysis days).    Yes Historical Provider, MD  butalbital-acetaminophen-caffeine (FIORICET) 50-325-40 MG tablet Take 1 tablet by mouth every 6 (six) hours as needed for headache. Patient not taking: Reported on 08/01/2015 02/05/15 02/05/16  Okey Regal, PA-C  ciprofloxacin (CIPRO) 500 MG tablet Take 1 tablet (500 mg total) by mouth daily. Patient not taking: Reported on 08/01/2015 07/23/15   Norman Herrlich, MD  metroNIDAZOLE (FLAGYL) 500 MG tablet Take 1 tablet (500 mg total) by mouth 3 (three) times daily. Patient not taking: Reported on 08/01/2015 07/23/15   Norman Herrlich, MD   BP 89/64 mmHg  Pulse 85  Temp(Src) 98.4 F (36.9 C) (Oral)  Resp 15  SpO2 100% Physical Exam  Constitutional: He appears well-developed and well-nourished. No distress.  HENT:  Head: Normocephalic and atraumatic.  Mouth/Throat: Oropharynx is clear and moist. No oropharyngeal exudate.  Eyes: Conjunctivae and EOM are normal. Pupils are equal, round, and reactive to light. Right eye exhibits no discharge. Left eye exhibits no discharge. No scleral icterus.  Neck: Normal range of motion. Neck supple. No thyromegaly present.  Cardiovascular: Normal rate, regular rhythm and normal heart sounds.  Exam reveals no gallop and no friction rub.   No  murmur heard. Pulmonary/Chest: Effort normal and breath sounds normal. No stridor. No respiratory distress. He has no wheezes. He has no rales.  Abdominal: Soft. Bowel sounds are normal. He exhibits no distension. There is tenderness in the right upper quadrant. There is no rebound, no guarding and no tenderness at McBurney's point.    Genitourinary: Rectal exam shows no external hemorrhoid, no internal hemorrhoid, no tenderness and anal tone normal. Guaiac positive stool.  Musculoskeletal: He exhibits no edema.  Lymphadenopathy:    He has no cervical adenopathy.  Neurological: He is alert. Coordination normal.  CN 3-12 intact; normal sensation throughout; 5/5 strength in all 4 extremities; equal bilateral grip strength; no ataxia on finger to nose   Skin: Skin is warm and dry. No rash noted. He is not diaphoretic. No pallor.  Psychiatric: He has a normal  mood and affect.  Nursing note and vitals reviewed.   ED Course  Procedures (including critical care time)  CRITICAL CARE Performed by: Frederica Kuster   Total critical care time: 30 minutes  Critical care time was exclusive of separately billable procedures and treating other patients.  Critical care was necessary to treat or prevent imminent or life-threatening deterioration.  Critical care was time spent personally by me on the following activities: development of treatment plan with patient and/or surrogate as well as nursing, discussions with consultants, evaluation of patient's response to treatment, examination of patient, obtaining history from patient or surrogate, ordering and performing treatments and interventions, ordering and review of laboratory studies, ordering and review of radiographic studies, pulse oximetry and re-evaluation of patient's condition.   Labs Review Labs Reviewed  CBC WITH DIFFERENTIAL/PLATELET - Abnormal; Notable for the following:    WBC 15.3 (*)    RBC 2.75 (*)    Hemoglobin 7.6 (*)    HCT  24.6 (*)    RDW 16.8 (*)    Neutro Abs 12.2 (*)    All other components within normal limits  POC OCCULT BLOOD, ED - Abnormal; Notable for the following:    Fecal Occult Bld POSITIVE (*)    All other components within normal limits  I-STAT CHEM 8, ED - Abnormal; Notable for the following:    Chloride 99 (*)    BUN 54 (*)    Creatinine, Ser 12.60 (*)    Glucose, Bld 123 (*)    Calcium, Ion 1.01 (*)    Hemoglobin 8.2 (*)    HCT 24.0 (*)    All other components within normal limits  CULTURE, BLOOD (ROUTINE X 2)  CULTURE, BLOOD (ROUTINE X 2)  PROTIME-INR  COMPREHENSIVE METABOLIC PANEL  LIPASE, BLOOD  TROPONIN I  I-STAT CG4 LACTIC ACID, ED  I-STAT CG4 LACTIC ACID, ED  TYPE AND SCREEN   HEMOGLOBIN  Date Value Ref Range Status  08/01/2015 8.2* 13.0 - 17.0 g/dL Final  08/01/2015 7.6* 13.0 - 17.0 g/dL Final  07/22/2015 10.2* 13.0 - 17.0 g/dL Final  07/21/2015 11.8* 13.0 - 17.0 g/dL Final      Imaging Review Ct Abdomen Pelvis W Contrast  08/01/2015  CLINICAL DATA:  55 year old male with a history of right-sided abdominal pain for 3 weeks EXAM: CT ABDOMEN AND PELVIS WITH CONTRAST TECHNIQUE: Multidetector CT imaging of the abdomen and pelvis was performed using the standard protocol following bolus administration of intravenous contrast. CONTRAST:  1 ISOVUE-300 IOPAMIDOL (ISOVUE-300) INJECTION 61% COMPARISON:  CT 07/21/2015, 09/03/2013 FINDINGS: Lower chest: Unremarkable appearance of the soft tissues of the chest wall. Heart size within normal limits.  No pericardial fluid/thickening. Calcifications of the coronary arteries in the distribution of the left main, circumflex, right coronary arteries. No lower mediastinal adenopathy. Unremarkable appearance of the distal esophagus. No hiatal hernia. No confluent airspace disease, pleural fluid, or pneumothorax within visualized lung. Small pulmonary nodule associated with the minor fissure on the right measures 3 mm. Abdomen/pelvis:  Unremarkable appearance of liver, spleen. Unremarkable appearance of bilateral adrenal glands. Unremarkable appearance of the pancreas with no cystic changes. Unremarkable gallbladder. Stigmata of polycystic kidney disease. No right or left-sided hydronephrosis. Punctate calcifications within the left kidney may represent nonobstructive stones versus calcifications. Cecal wall thickening with associated inflammatory changes of the adjacent mesenteric and trace amount of fluid within the fracture planes. No abscess. No free intraperitoneal air. Background of colonic diverticula with inspissated secretions. Normal appendix. No significant inflammatory changes identified  within the distal ileum. Overall the inflammatory changes are similar in appearance to the CT that was performed 07/21/2015. No abnormally distended small bowel or colon. Colonic diverticula extend through the length of the colon. Unremarkable course caliber and contour of the abdominal aorta and iliac vessels. Minimal atherosclerotic changes. No free air.  No pelvic free fluid. Unremarkable appearance of the urinary bladder No displaced fracture. Sclerotic appearance of this musculoskeletal elements, compatible with renal osteodystrophy. IMPRESSION: Similar appearance of inflammation involving the cecum, which is either unchanged or recurrent from the CT performed 07/21/2015. Favored diagnosis is cecal diverticulitis in the absence of a known history of inflammatory bowel disease. Future correlation with colonoscopy is recommended, as an inflammatory tumor cannot be excluded. Stigmata of polycystic kidney disease with associated renal osteodystrophy. Coronary artery disease. There is a 3 mm nodule associated with the minor fissure. If the patient is at high risk for bronchogenic carcinoma, follow-up chest CT at 1 year is recommended. If the patient is at low risk, no follow-up is needed. This recommendation follows the consensus statement: Guidelines  for Management of Small Pulmonary Nodules Detected on CT Scans: A Statement from the [place] as published in Radiology 2005; 237:395-400. These results were called by telephone at the time of interpretation on 08/01/2015 at 7:28 am to Dr. Venora Maples, who verbally acknowledged these results. Signed, Dulcy Fanny. Earleen Newport, DO [facility] Radiology Electronically Signed   By: Corrie Mckusick D.O.   On: 08/01/2015 07:34   Dg Chest Port 1 View  08/01/2015  CLINICAL DATA:  Shortness of breath, syncope EXAM: PORTABLE CHEST 1 VIEW COMPARISON:  02/08/2009.  Chest CT 07/22/2015 FINDINGS: Heart and mediastinal contours are within normal limits. No focal opacities or effusions. No acute bony abnormality. IMPRESSION: No active disease. Electronically Signed   By: Rolm Baptise M.D.   On: 08/01/2015 07:12   I have personally reviewed and evaluated these images and lab results as part of my medical decision-making.   EKG Interpretation   Date/Time:  Monday August 01 2015 05:47:27 EDT Ventricular Rate:  110 PR Interval:    QRS Duration: 100 QT Interval:  348 QTC Calculation: 471 R Axis:   72 Text Interpretation:  Sinus tachycardia No acute changes s1q3t3 is not new  possibly peaked T wave No significant change since last tracing Confirmed  by NANAVATI, MD, ANKIT [phone]) on 08/01/2015 6:02:05 AM      Sepsis - Repeat Assessment  Performed at:    0920  Vitals     Blood pressure 92/71, pulse 76, temperature 98.4 F (36.9 C), temperature source Oral, resp. rate 14, SpO2 100 %.  Heart:     Regular rate and rhythm  Lungs:    CTA  Capillary Refill:   <2 sec  Peripheral Pulse:   Dorsalis pedis pulse  palpable  Skin:     Normal Color and Dry     MDM   CBC shows WBC 15.3, hemoglobin 7.6, which is a decrease from 1 week at 10.2. CMP Glucose 126, BUN 57, creatinine 12.03, calcium 7.9, protein 6.0, albumin 2.2, AST 14, AST 11, total bilirubin 0.2. Lipase 124.  Lactic 1.61, repeat 1.19. Troponin 0.03. Protime 14.8, INR 1.14. Fecal occult positive. Type and screen O Pos, antibody negative. Blood cultures pending. Patient currently (08/01/15 [id number]) declines blood products due to religious preferences, and would like more conservative treatment at this time. Patient was given iron in May 2016. CT abdomen and pelvis shows cecal diverticulitis. CXR  shows no active disease. I consulted Dr. Titus Mould with critical care who will admit the patient. After further discussion with the patient by Dr. Titus Mould, the patient is studying to be a Jehovah's Witness and the patient would like to receive blood products if his hemoglobin drops below 7, or if the patient is in shock and needs pressors. I also consulted Dr. Florene Glen with nephrology who will assume care of the patient's dialysis needs.   Final diagnoses:  Acute diverticulitis  Shock circulatory ([place])  Acute blood loss anemia        Frederica Kuster, PA-C 08/01/15 [id number]  Varney Biles, MD 08/09/15 1103

## 2015-08-01 NOTE — ED Notes (Addendum)
Consent completed with conditions of ONLY if HgB < 7.0, or if pressors are needed to maintain blood pressure.  Faxed to blood bank.

## 2015-08-01 NOTE — ED Notes (Signed)
Dr. Nanavati at bedside 

## 2015-08-01 NOTE — Progress Notes (Signed)
Repeat Hb 6.8. Previously discussed need to transfuse if Hb < 7.0 with patient, had initially agreed. Ordered 1 units to transfuse, 1 unit to hold. Once again discussed with [name], says he would still like to wait. Informed him of the risks of not receiving blood products given his large volume loss. He understands and will continue to consider transfusion.   Natasha Bence, MD PGY-3, Internal Medicine Pager: [phone]

## 2015-08-01 NOTE — Progress Notes (Signed)
[facility] Initial Note Subjective:   Just hosp 6/8-6/10 with diverticulitis of cecum.  D/c with PO Cipro (almost finished )  No rectal bleeding then. Now admitted  with 5 episodes of pt reported bright red bld per rectum since 2 am today and syncopal dizziness after last one at home/ . Last OP HGB 07/27/15 = 10.0  hgb in ER  7.6>6.8->6.2  / bp now 94/73 / "feel good except  Mild RL Quad discomfort was severe with last admit ." Last HD was Friday 14th on schedule without reported problems .   Objective Vital signs in last 24 hours: Filed Vitals:   08/01/15 0900 08/01/15 0929 08/01/15 0930 08/01/15 1000  BP: 112/71 104/70 106/82 98/68  Pulse:  64 61 58  Temp:      TempSrc:      Resp: 10 11 11 12   SpO2:  100% 100% 100%   Weight change:   Physical Exam: General:alert/pleasant  AAM / talkative , NAD Heart: RRR no rub, mur , gallop Lungs: CTA  bilat non labored breathing  Abdomen: BS pos, soft slightly tender RL quad /ND Extremities: no pedal edema  Dialysis Access: pos bruit  RFA AVF with  aneurysmal appearance      OP Dialysis Orders: Center: [facility] MWF 4.5 hours 180 500 A 1.5 EDW 96.5 2K 2 Ca right lower AVF Hectorol 2 heparin 5000 Mircera 50 q 2 last  On 07/27/15  Recent labs: hgb 10.0  On 6/14  , 10.4 on 06/19/15  21% sat ferritin 1476   iPTH 1242   P 5.6 Ca 8.6   Problem/Plan: 1.  Lower GI Bld  With recent admit Diverticulitis of cecum= per admit /consulting surg./ No hep HD today/ Pt is Jehovah wit . BUT agrees to RBCs if Life Threateningly low hgb / fu hgb pre hd    2. ESRD - HD MWF - No hep today  Reck k pre hd  3. Anemia - of ESRD and GI Bld loss= ESA  Just given  On 14th fu hgb trend  4. Secondary hyperparathyroidism - pTH >1,000 with ho Sensipar nonadherence/ on 90 mb bid and vit d  . Binder when eating   5. HTN/volume -Lowish in ER sec BI bld.given Gentle IV hydration in ER /  No Uf on HD with CXR = NAD lowish bp  6. Jehovah Wit. 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 Ernest Haber, PA-C [facility] Beeper [phone] 08/01/2015,11:59 AM  LOS: 0 days   I have seen and examined this patient and agree with plan and assessment in the above note. Pt was hospitalized earlier this month with cecal diverticulitis, no bleeding at that time, last Hb as outpt 6/14 was 10. Onset early AM of bright red rectal bleeding, syncope, Hb on presentation 7.6-> 6.8, recheck just done 1:30 PM down to 6.2. No bloody stools here yet but says "stomach gurgling like it was this AM". BP stable. Despite his Jehovah Witness status, he is agreeable to transfusion. Will get 2 units on dialysis (obtaining another Hb at 5PM and if <6 will go ahead with a unit them, give the 2nd one on HD). General surgery has seen - recommending nuclear study or angio if "rebleeds".  Trustin Chapa B,MD 08/01/2015 2:55 PM  Labs: Basic Metabolic Panel:  Recent Labs Lab 08/01/15 0610 08/01/15 0627  NA 137 138  K 4.1 4.0  CL 102 99*  CO2 22  --   GLUCOSE  126* 123*  BUN 57* 54*  CREATININE 12.03* 12.60*  CALCIUM 7.9*  --      Recent Labs Lab 08/01/15 0610  AST 14*  ALT 11*  ALKPHOS 61  BILITOT 0.2*  PROT 6.0*  ALBUMIN 2.2*    Recent Labs Lab 08/01/15 0610  LIPASE 124*   CBC:  Recent Labs Lab 08/01/15 0610 08/01/15 0627 08/01/15 1148 08/01/15 1322  WBC 15.3*  --  13.4* 15.2*  NEUTROABS 12.2*  --   --   --   HGB 7.6* 8.2* 6.8* 6.2*  HCT 24.6* 24.0* 21.9* 19.9*  MCV 89.5  --  89.8 88.8  PLT 344  --  273 266     Recent Labs Lab 08/01/15 0610  TROPONINI 0.03    Medications: . sodium chloride    . piperacillin-tazobactam (ZOSYN)  IV     . ferumoxytol  510 mg Intravenous Q Mon  . folic acid  1 mg Intravenous Daily  . pantoprazole (PROTONIX) IV  40 mg Intravenous Q12H

## 2015-08-01 NOTE — ED Provider Notes (Signed)
EKG Interpretation  Date/Time:  Monday August 01 2015 05:47:27 EDT Ventricular Rate:  110 PR Interval:    QRS Duration: 100 QT Interval:  348 QTC Calculation: 471 R Axis:   72 Text Interpretation:  Sinus tachycardia No acute changes s1q3t3 is not new possibly peaked T wave No significant change since last tracing Confirmed by Kathrynn Humble, MD, Haji Delaine [phone]) on 08/01/2015 6:02:05 AM          ICD-9-CM ICD-10-CM   1. Acute diverticulitis 562.11 K57.92   2. Shock circulatory ([facility]) 785.50 R57.9   3. Acute blood loss anemia 285.1 D62      Pt comes in with cc of bloody stools. Hx of diverticulitis, and likely has active diverticular bleed. Pt arrived with shock index > 1, and hypotension. Pt had RLQ abd tenderness and RUQ tenderness as well. CT abdomen shows diverticulitis.  We think the shock is multifactorial - hypovolemia/hemorrhagic and sepsis. Fluid resuscitation started. Sepsis workup started. Lactate < 2.  Pt's Hb:  HEMOGLOBIN  Date Value Ref Range Status  08/01/2015 8.2* 13.0 - 17.0 g/dL Final  08/01/2015 7.6* 13.0 - 17.0 g/dL Final  07/22/2015 10.2* 13.0 - 17.0 g/dL Final  07/21/2015 11.8* 13.0 - 17.0 g/dL Final    So he has dropped 4 grams since his admission on 6/8. Pt is Jehovah's witness. He declines blood at this time. He had admission last month for anemia with Hb in the 6 range - and was given iv IRON. Folic acid and iron iv ordered now. IR has already informed us that there is no role of embolization unless patient is having massive bleed. Pt aware of the critically low Hb and the treatment options available. He will keep his options open.  Finally, pt has ESRD o HD. Today is his HD day. Nephro consulted.   CRITICAL CARE Performed by: Varney Biles   Total critical care time: 75 minutes  Critical care time was exclusive of separately billable procedures and treating other patients.  Critical care was necessary to treat or prevent imminent or life-threatening  deterioration.  Critical care was time spent personally by me on the following activities: development of treatment plan with patient and/or surrogate as well as nursing, discussions with consultants, evaluation of patient's response to treatment, examination of patient, obtaining history from patient or surrogate, ordering and performing treatments and interventions, ordering and review of laboratory studies, ordering and review of radiographic studies, pulse oximetry and re-evaluation of patient's condition.   Sepsis - Repeat Assessment  Performed at:    8:00 am  Vitals     Blood pressure 89/64, pulse 85, temperature 98.4 F (36.9 C), temperature source Oral, resp. rate 15, SpO2 100 %.  Heart:     Tachycardic - improved to 92  Lungs:    CTA  Capillary Refill:   <2 sec  Peripheral Pulse:   Radial pulse palpable  Skin:     Normal Color     Varney Biles, MD 08/01/15 [phone]

## 2015-08-01 NOTE — Progress Notes (Signed)
Pharmacy Antibiotic Note  Carl Cleary Sr. is a 55 y.o. male admitted on 08/01/2015 with intra-abdominal infection.  Pharmacy has been consulted for Zosyn dosing.  Day #1 of abx for intra-abdominal infection. CT consistent Afebrile, WBC elevated at 15.3. SCr up to 12.6. Hx of ESRD. One time dose of Zosyn ordered in the ED.  Plan: Start Zosyn 2.25g IV Q8 Monitor clinical picture, renal function F/U C&S, abx deescalation / LOT      Temp (24hrs), Avg:98.7 F (37.1 C), Min:98.4 F (36.9 C), Max:98.9 F (37.2 C)   Recent Labs Lab 08/01/15 0610 08/01/15 0627  WBC 15.3*  --   CREATININE  --  12.60*  LATICACIDVEN  --  1.61    Estimated Creatinine Clearance: 7.4 mL/min (by C-G formula based on Cr of 12.6).    Allergies  Allergen Reactions  . Infed [Iron Dextran] Other (See Comments)    Decreased BP   . Oxycodone Nausea Only  . Vicodin [Hydrocodone-Acetaminophen] Other (See Comments)    Decreased BP  . Diclofenac Other (See Comments)    Unknown  . Whole Blood Other (See Comments)    Unk    Antimicrobials this admission: Zosyn 6/19 >>   Dose adjustments this admission: n/a  Microbiology results: 6/19 BCx: sent  Thank you for allowing pharmacy to be a part of this patient's care.  [name] 08/01/2015 7:49 AM

## 2015-08-01 NOTE — Progress Notes (Signed)
Arrived to patient room 49M-02 at 1852.  Reviewed treatment plan and this RN agrees.  Report received from bedside RN, Carmell Austria.  Consent verified.  Patient A & O X 4. Lung sounds clear to ausculation in all fields. Generalized edema. Cardiac: NSR.  Prepped RLA F with alcohol and cannulated with two 15 gauge needles.  Pulsation of blood noted.  Flushed access well with saline per protocol.  Connected and secured lines and initiated tx at [place].  UF goal of 500 mL and net fluid removal of 0 mL.  Will continue to monitor.

## 2015-08-01 NOTE — H&P (Signed)
PULMONARY / CRITICAL CARE MEDICINE   Name: Carl Winkle Sr. MRN: [id number] DOB: 1960/05/07    ADMISSION DATE:  08/01/2015 CONSULTATION DATE:  08/01/2015  REFERRING MD:  Kathrynn Humble  CHIEF COMPLAINT:  GI bleed  HISTORY OF PRESENT ILLNESS:   Mr. Carl Skene Sr. is a 55 y.o. male w/ PMHx of ESRD on HD, OSA, HTN, anemia, and recent admission for diverticulitis, presents to the ED w/ complaints of syncope and rectal bleeding. Patient is a Games developer. Says his bleeding started this AM as what he thought was diarrhea, but realized there was copious amounts of bright red blood in his stool. He has had a recent admission for diverticulitis (discharged 6/10) and was sent home with Cipro and Flagyl for total of 14 day course (ending 08/04/15). He continues to have RLQ pain, but denies fever, chills, nausea, or vomiting.   Discussed risks of not accepting blood products 2/2 Jehovah's Witness faith. Patient agreed that if he develops signs of hypovolemic shock requiring pressors or has Hb < 7, he will in fact accept blood products.   PAST MEDICAL HISTORY :  He  has a past medical history of Hypertension; Anemia; Chronic headaches; Sleep apnea; Chronic kidney disease; Presence of arteriovenous fistula for hemodialysis, primary ([place]); Hemodialysis patient [facility]); Stroke [facility]); GI bleed; Diverticulitis; and Refusal of blood product.  PAST SURGICAL HISTORY: He  has past surgical history that includes AV fistula placement (Right); graft left arm (Left); and Insertion of dialysis catheter.  Allergies  Allergen Reactions  . Infed [Iron Dextran] Other (See Comments)    Decreased BP   . Oxycodone Nausea Only  . Vicodin [Hydrocodone-Acetaminophen] Other (See Comments)    Decreased BP  . Diclofenac Other (See Comments)    Unknown  . Whole Blood Other (See Comments)    Unk    No current facility-administered medications on file prior to encounter.   Current Outpatient Prescriptions on File Prior  to Encounter  Medication Sig  . aspirin EC 81 MG tablet Take 1 tablet (81 mg total) by mouth daily.  Marland Kitchen atorvastatin (LIPITOR) 10 MG tablet Take 1 tablet (10 mg total) by mouth daily at 6 PM.  . calcium acetate (PHOSLO) 667 MG capsule Take 2,001 mg by mouth 3 (three) times daily with meals.   . cinacalcet (SENSIPAR) 90 MG tablet Take 90 mg by mouth daily.  . folic acid-vitamin b complex-vitamin c-selenium-zinc (DIALYVITE) 3 MG TABS tablet Take 1 tablet by mouth daily.  Marland Kitchen gabapentin (NEURONTIN) 100 MG capsule Take 100 mg by mouth 2 (two) times daily.   Marland Kitchen ibuprofen (ADVIL,MOTRIN) 800 MG tablet Take 800 mg by mouth 2 (two) times daily.  Marland Kitchen lanthanum (FOSRENOL) 1000 MG chewable tablet Chew 1,000 mg by mouth 3 (three) times daily with meals.  . sorbitol 70 % solution Take 15-60 mLs by mouth daily as needed (dialysis days).   . butalbital-acetaminophen-caffeine (FIORICET) 50-325-40 MG tablet Take 1 tablet by mouth every 6 (six) hours as needed for headache. (Patient not taking: Reported on 08/01/2015)  . ciprofloxacin (CIPRO) 500 MG tablet Take 1 tablet (500 mg total) by mouth daily. (Patient not taking: Reported on 08/01/2015)  . metroNIDAZOLE (FLAGYL) 500 MG tablet Take 1 tablet (500 mg total) by mouth 3 (three) times daily. (Patient not taking: Reported on 08/01/2015)    FAMILY HISTORY:  His indicated that his mother is deceased. He indicated that his father is deceased.   SOCIAL HISTORY: He  reports that he quit smoking about 25 years ago.  He has never used smokeless tobacco. He reports that he drinks about 0.6 oz of alcohol per week. He reports that he does not use illicit drugs.  REVIEW OF SYSTEMS:    Constitutional: Denies fever, chills, diaphoresis, appetite change and fatigue.  HEENT: Denies photophobia, eye pain, redness, hearing loss, ear pain, congestion, sore throat, rhinorrhea, sneezing, mouth sores, trouble swallowing, neck pain, neck stiffness and tinnitus.   Respiratory: Denies SOB,  DOE, cough, chest tightness,  and wheezing.   Cardiovascular: Denies chest pain, palpitations and leg swelling.  Gastrointestinal: Positive for diarrhea, abdominal pain, and blood in stool. Denies nausea, vomiting, constipation, and abdominal distention.  Genitourinary: Denies dysuria, urgency, frequency, hematuria, flank pain and difficulty urinating.  Endocrine: Denies: hot or cold intolerance, sweats, changes in hair or nails, polyuria, polydipsia. Musculoskeletal: Denies myalgias, back pain, joint swelling, arthralgias and gait problem.  Skin: Denies pallor, rash and wound.  Neurological: Positive for syncope and lightheadedness. Denies dizziness, seizures, weakness, numbness and headaches.  Hematological: Denies adenopathy. Easy bruising, personal or family bleeding history  Psychiatric/Behavioral: Denies suicidal ideation, mood changes, confusion, nervousness, sleep disturbance and agitation   SUBJECTIVE:  Patient lying in bed, no distress. Feels weak. Mild RLQ pain.   VITAL SIGNS: BP 92/71 mmHg  Pulse 76  Temp(Src) 98.4 F (36.9 C) (Oral)  Resp 14  SpO2 100%   INTAKE / OUTPUT: I/O last 3 completed shifts: In: 1000 [I.V.:1000] Out: -   PHYSICAL EXAMINATION: General:  AA male, lying in bed, NAD.  Neuro:  A&O x3. Moves all extremities spontaneously. No focal weakness or sensory deficits.  HEENT:  PERRL, EOMI. Moist mucus membranes.  Cardiovascular:  RRR, no murmurs, gallops, or rubs.  Lungs:  Clear to auscultation bilaterally. No wheezes, rales, rhonchi.  Abdomen:  Soft, tender in the RLQ. BS +. No distension.  Musculoskeletal:  No muscle pain, no edema. RUE AVF with good thrill and bruit.  Skin:  No rashes.   LABS:  BMET  Recent Labs Lab 08/01/15 0610 08/01/15 0627  NA 137 138  K 4.1 4.0  CL 102 99*  CO2 22  --   BUN 57* 54*  CREATININE 12.03* 12.60*  GLUCOSE 126* 123*    Electrolytes  Recent Labs Lab 08/01/15 0610  CALCIUM 7.9*    CBC  Recent  Labs Lab 08/01/15 0610 08/01/15 0627  WBC 15.3*  --   HGB 7.6* 8.2*  HCT 24.6* 24.0*  PLT 344  --     Coag's  Recent Labs Lab 08/01/15 0610  INR 1.14    Sepsis Markers  Recent Labs Lab 08/01/15 0627 08/01/15 0817  LATICACIDVEN 1.61 1.19    Liver Enzymes  Recent Labs Lab 08/01/15 0610  AST 14*  ALT 11*  ALKPHOS 61  BILITOT 0.2*  ALBUMIN 2.2*    Cardiac Enzymes  Recent Labs Lab 08/01/15 0610  TROPONINI 0.03    Imaging Ct Abdomen Pelvis W Contrast  08/01/2015  CLINICAL DATA:  55 year old male with a history of right-sided abdominal pain for 3 weeks EXAM: CT ABDOMEN AND PELVIS WITH CONTRAST TECHNIQUE: Multidetector CT imaging of the abdomen and pelvis was performed using the standard protocol following bolus administration of intravenous contrast. CONTRAST:  1 ISOVUE-300 IOPAMIDOL (ISOVUE-300) INJECTION 61% COMPARISON:  CT 07/21/2015, 09/03/2013 FINDINGS: Lower chest: Unremarkable appearance of the soft tissues of the chest wall. Heart size within normal limits.  No pericardial fluid/thickening. Calcifications of the coronary arteries in the distribution of the left main, circumflex, right coronary arteries. No lower  mediastinal adenopathy. Unremarkable appearance of the distal esophagus. No hiatal hernia. No confluent airspace disease, pleural fluid, or pneumothorax within visualized lung. Small pulmonary nodule associated with the minor fissure on the right measures 3 mm. Abdomen/pelvis: Unremarkable appearance of liver, spleen. Unremarkable appearance of bilateral adrenal glands. Unremarkable appearance of the pancreas with no cystic changes. Unremarkable gallbladder. Stigmata of polycystic kidney disease. No right or left-sided hydronephrosis. Punctate calcifications within the left kidney may represent nonobstructive stones versus calcifications. Cecal wall thickening with associated inflammatory changes of the adjacent mesenteric and trace amount of fluid within the  fracture planes. No abscess. No free intraperitoneal air. Background of colonic diverticula with inspissated secretions. Normal appendix. No significant inflammatory changes identified within the distal ileum. Overall the inflammatory changes are similar in appearance to the CT that was performed 07/21/2015. No abnormally distended small bowel or colon. Colonic diverticula extend through the length of the colon. Unremarkable course caliber and contour of the abdominal aorta and iliac vessels. Minimal atherosclerotic changes. No free air.  No pelvic free fluid. Unremarkable appearance of the urinary bladder No displaced fracture. Sclerotic appearance of this musculoskeletal elements, compatible with renal osteodystrophy. IMPRESSION: Similar appearance of inflammation involving the cecum, which is either unchanged or recurrent from the CT performed 07/21/2015. Favored diagnosis is cecal diverticulitis in the absence of a known history of inflammatory bowel disease. Future correlation with colonoscopy is recommended, as an inflammatory tumor cannot be excluded. Stigmata of polycystic kidney disease with associated renal osteodystrophy. Coronary artery disease. There is a 3 mm nodule associated with the minor fissure. If the patient is at high risk for bronchogenic carcinoma, follow-up chest CT at 1 year is recommended. If the patient is at low risk, no follow-up is needed. This recommendation follows the consensus statement: Guidelines for Management of Small Pulmonary Nodules Detected on CT Scans: A Statement from the [place] as published in Radiology 2005; 237:395-400. These results were called by telephone at the time of interpretation on 08/01/2015 at 7:28 am to Dr. Venora Maples, who verbally acknowledged these results. Signed, Dulcy Fanny. Earleen Newport, DO [facility] Electronically Signed   By: Corrie Mckusick D.O.   On: 08/01/2015 07:34   Dg Chest Port 1  View  08/01/2015  CLINICAL DATA:  Shortness of breath, syncope EXAM: PORTABLE CHEST 1 VIEW COMPARISON:  02/08/2009.  Chest CT 07/22/2015 FINDINGS: Heart and mediastinal contours are within normal limits. No focal opacities or effusions. No acute bony abnormality. IMPRESSION: No active disease. Electronically Signed   By: Rolm Baptise M.D.   On: 08/01/2015 07:12     STUDIES:  CT Abd 08/01/15 >> Cecal inflammation suggestive of diverticulitis, seen on previous scan.   CULTURES: Blood 6/19 >>  ANTIBIOTICS: Zosyn 6/19 >>   SIGNIFICANT EVENTS: 6/19: Admit  LINES/TUBES: PIV 6/19  DISCUSSION: 55 y/o M w/ PMHx of ESRD on HD, Jehovah's Witness, presented to the ED with syncope and rectal bleeding.   ASSESSMENT / PLAN:  PULMONARY A: No acute issues P:   Supplemental O2 prn  CARDIOVASCULAR A:  Hypotension Hypovolemia P:  MAP goal > 65 Patient agreed to transfusion of blood products if pressors are needed to support BP Gentle IV hydration  RENAL A:   ESRD on HD P:   Renal to see  GASTROINTESTINAL A:   GI Bleed likely diverticular Recent Diverticulitis P:   Start Protonix IV bid Consult surgery  HEMATOLOGIC A:   Acute Blood Loss Anemia Leukocytosis P:  CBC q4h  Transfuse for Hb < 7 as agreed upon by patient Feraheme [id number] mg IV once + Folic acid 1 mg IV qd ABx as below DDAVP 20 mcg once now  INFECTIOUS A:   Recent Diverticulitis P:   Zosyn (end date 08/04/15)  ENDOCRINE A:   No acute issues   P:   Monitor CBG's on BMP  NEUROLOGIC A:   Syncope 2/2 acute blood loss P:   Neuro checks q2h   FAMILY  - Updates: Patient and son updated at bedside 08/01/15    Natasha Bence, MD PGY-3, Internal Medicine Pager: [phone]   STAFF NOTE: Linwood Dibbles, MD FACP have personally reviewed patient's available data, including medical history, events of note, physical examination and test results as part of my evaluation. I have discussed with resident/NP  and other care providers such as pharmacist, RN and RRT. In addition, I personally evaluated patient and elicited key findings of: no distress, wide awake, O x 4, nonfocal, tender rlq, no reb, no guard, no active bleeding in bed now, he described high volume dark blood x 5 since 2 am, NOT tachy or hypotensive now, lactic reassuring, likely this is again divertic source, missed am esrd, although he is typically compliant, last HD Friday, would give STAT DDAVP x 1, cbc, type and cross stat, long d/w with him and son at bedside, wants blood and products only as last resort life threatening, we agreed that hgb less 7 in this circumstance and/or hypotension would need PRBC, concern was that if he drops BP he may not be able to make that decision then, sona dn pt agree to this plan, cbc q4h, coags are wnl, no fevers, NO sepsis present,m he requested IV iron , can provide,he understands not helpful acutely, will contact GI and have surgeon aware of him given JW wishes, NPO, PPI, contact renal, avoid gross overload will dilute and not help deliver O2 well, would use products with above terms The patient is critically ill with multiple organ systems failure and requires high complexity decision making for assessment and support, frequent evaluation and titration of therapies, application of advanced monitoring technologies and extensive interpretation of multiple databases.   Critical Care Time devoted to patient care services described in this note is 45 Minutes. This time reflects time of care of this signee: Merrie Roof, MD FACP. This critical care time does not reflect procedure time, or teaching time or supervisory time of PA/NP/Med student/Med Resident etc but could involve care discussion time. Rest per NP/medical resident whose note is outlined above and that I agree with   Lavon Paganini. Titus Mould, MD, [place] Pgr: [place] 08/01/2015 9:28 AM

## 2015-08-01 NOTE — ED Notes (Signed)
Attempted to report x 1

## 2015-08-01 NOTE — ED Notes (Signed)
X-ray at bedside

## 2015-08-01 NOTE — Progress Notes (Signed)
Patient does not feel comfortable receiving ordered blood at this time. He asked for 30 minutes to "think about it". Dr Titus Mould notified.

## 2015-08-01 NOTE — Care Management Note (Signed)
Case Management Note  Patient Details  Name: Carl Groome Sr. MRN: [id number] Date of Birth: 1960-02-18  Subjective/Objective:                  55 year old male with a history of CKD and diverticular disease who presents with rectal bleeding and syncope. Pt is  Jehovah's witness; dialysis. /From home alone.  Action/Plan: Follow for disposition needs.   Expected Discharge Date:  08/03/15               Expected Discharge Plan:  [place]  In-House Referral:     Discharge planning Services  CM Consult  Post Acute Care Choice:    Choice offered to:     DME Arranged:    DME Agency:     HH Arranged:    HH Agency:     Status of Service:  In process, will continue to follow  Medicare Important Message Given:    Date Medicare IM Given:    Medicare IM give by:    Date Additional Medicare IM Given:    Additional Medicare Important Message give by:     If discussed at [place] of Stay Meetings, dates discussed:    Additional Comments:  Fuller Mandril, RN 08/01/2015, 9:44 AM

## 2015-08-01 NOTE — Consult Note (Signed)
Reason for Consult:lower GI bleed Referring Physician: Dr. Merrie Roof   HPI: [name] is a 55 year old male with a history of ESRD HD MWF, OSA, HTN, anemia, recently admitted with cecal diverticulitis which was managed with antibiotics.  He presents today after having 5 large bloody bowel movements starting at 2AM this morning.  Previous history of GI bleeding in 2016 which was apparently distal colon, but does not appear it was ever localized.  He reports mild RLQ abdominal pain.  Denies nausea or vomiting. ED work up reveals, h&h 7.6/24.6 which is down from baseline around 10 hgb.  The patient is a Sales promotion account executive witness.    Patient does take an ASA daily and receives heparin during dialysis.    Past Medical History  Diagnosis Date  . Hypertension   . Anemia   . Chronic headaches   . Sleep apnea   . Chronic kidney disease     Chronic Renal Failure; On Renal Transplant List  . Presence of arteriovenous fistula for hemodialysis, primary ([place])     RUE  . Hemodialysis patient ([place])     M-W-F  . Stroke ([place])   . GI bleed   . Diverticulitis   . Refusal of blood product     NO WHOLE BLOOD PROUCTS    Past Surgical History  Procedure Laterality Date  . Av fistula placement Right     right arm  . Graft left arm Left     for dialysis x 2  . Insertion of dialysis catheter      Rt chest    Family History  Problem Relation Age of Onset  . Diabetes Father   . Stroke Father   . Hypertension Father   . Uterine cancer Mother   . Lupus Sister   . Stroke Sister   . Anuerysm Brother     brain  . Hypertension Sister     Social History:  reports that he quit smoking about 25 years ago. He has never used smokeless tobacco. He reports that he drinks about 0.6 oz of alcohol per week. He reports that he does not use illicit drugs.  Allergies:  Allergies  Allergen Reactions  . Infed [Iron Dextran] Other (See Comments)    Decreased BP   . Oxycodone Nausea Only  . Vicodin  [Hydrocodone-Acetaminophen] Other (See Comments)    Decreased BP  . Diclofenac Other (See Comments)    Unknown  . Whole Blood Other (See Comments)    Unk    Medications:  Prior to Admission medications   Medication Sig Start Date End Date Taking? Authorizing Provider  aspirin EC 81 MG tablet Take 1 tablet (81 mg total) by mouth daily. 08/26/14  Yes Rosalin Hawking, MD  atorvastatin (LIPITOR) 10 MG tablet Take 1 tablet (10 mg total) by mouth daily at 6 PM. 05/25/14  Yes Thurnell Lose, MD  calcium acetate (PHOSLO) 667 MG capsule Take 2,001 mg by mouth 3 (three) times daily with meals.    Yes Historical Provider, MD  cinacalcet (SENSIPAR) 90 MG tablet Take 90 mg by mouth daily.   Yes Historical Provider, MD  folic acid-vitamin b complex-vitamin c-selenium-zinc (DIALYVITE) 3 MG TABS tablet Take 1 tablet by mouth daily.   Yes Historical Provider, MD  gabapentin (NEURONTIN) 100 MG capsule Take 100 mg by mouth 2 (two) times daily.  09/29/14  Yes Historical Provider, MD  ibuprofen (ADVIL,MOTRIN) 800 MG tablet Take 800 mg by mouth 2 (two) times daily.   Yes  Historical Provider, MD  lanthanum (FOSRENOL) 1000 MG chewable tablet Chew 1,000 mg by mouth 3 (three) times daily with meals.   Yes Historical Provider, MD  sorbitol 70 % solution Take 15-60 mLs by mouth daily as needed (dialysis days).    Yes Historical Provider, MD  butalbital-acetaminophen-caffeine (FIORICET) 50-325-40 MG tablet Take 1 tablet by mouth every 6 (six) hours as needed for headache. Patient not taking: Reported on 08/01/2015 02/05/15 02/05/16  Okey Regal, PA-C  ciprofloxacin (CIPRO) 500 MG tablet Take 1 tablet (500 mg total) by mouth daily. Patient not taking: Reported on 08/01/2015 07/23/15   Norman Herrlich, MD  metroNIDAZOLE (FLAGYL) 500 MG tablet Take 1 tablet (500 mg total) by mouth 3 (three) times daily. Patient not taking: Reported on 08/01/2015 07/23/15   Norman Herrlich, MD     Results for orders placed or performed during the  hospital encounter of 08/01/15 (from the past 48 hour(s))  Comprehensive metabolic panel     Status: Abnormal   Collection Time: 08/01/15  6:10 AM  Result Value Ref Range   Sodium 137 135 - 145 mmol/L   Potassium 4.1 3.5 - 5.1 mmol/L   Chloride 102 101 - 111 mmol/L   CO2 22 22 - 32 mmol/L   Glucose, Bld 126 (H) 65 - 99 mg/dL   BUN 57 (H) 6 - 20 mg/dL   Creatinine, Ser 12.03 (H) 0.61 - 1.24 mg/dL   Calcium 7.9 (L) 8.9 - 10.3 mg/dL   Total Protein 6.0 (L) 6.5 - 8.1 g/dL   Albumin 2.2 (L) 3.5 - 5.0 g/dL   AST 14 (L) 15 - 41 U/L   ALT 11 (L) 17 - 63 U/L   Alkaline Phosphatase 61 38 - 126 U/L   Total Bilirubin 0.2 (L) 0.3 - 1.2 mg/dL   GFR calc non Af Amer 4 (L) >60 mL/min   GFR calc Af Amer 5 (L) >60 mL/min    Comment: (NOTE) The eGFR has been calculated using the CKD EPI equation. This calculation has not been validated in all clinical situations. eGFR's persistently <60 mL/min signify possible Chronic Kidney Disease.    Anion gap 13 5 - 15  Type and screen [place]     Status: None   Collection Time: 08/01/15  6:10 AM  Result Value Ref Range   ABO/RH(D) O POS    Antibody Screen NEG    Sample Expiration 08/04/2015   Lipase, blood     Status: Abnormal   Collection Time: 08/01/15  6:10 AM  Result Value Ref Range   Lipase 124 (H) 11 - 51 U/L  Protime-INR     Status: None   Collection Time: 08/01/15  6:10 AM  Result Value Ref Range   Prothrombin Time 14.8 11.6 - 15.2 seconds   INR 1.14 0.00 - 1.49  CBC with Differential/Platelet     Status: Abnormal   Collection Time: 08/01/15  6:10 AM  Result Value Ref Range   WBC 15.3 (H) 4.0 - 10.5 K/uL   RBC 2.75 (L) 4.22 - 5.81 MIL/uL   Hemoglobin 7.6 (L) 13.0 - 17.0 g/dL   HCT 24.6 (L) 39.0 - 52.0 %   MCV 89.5 78.0 - 100.0 fL   MCH 27.6 26.0 - 34.0 pg   MCHC 30.9 30.0 - 36.0 g/dL   RDW 16.8 (H) 11.5 - 15.5 %   Platelets 344 150 - 400 K/uL   Neutrophils Relative % 80 %   Neutro Abs 12.2 (H)  1.7 - 7.7 K/uL    Lymphocytes Relative 13 %   Lymphs Abs 2.0 0.7 - 4.0 K/uL   Monocytes Relative 5 %   Monocytes Absolute 0.7 0.1 - 1.0 K/uL   Eosinophils Relative 2 %   Eosinophils Absolute 0.4 0.0 - 0.7 K/uL   Basophils Relative 0 %   Basophils Absolute 0.1 0.0 - 0.1 K/uL  Troponin I     Status: None   Collection Time: 08/01/15  6:10 AM  Result Value Ref Range   Troponin I 0.03 <0.031 ng/mL    Comment:        NO INDICATION OF MYOCARDIAL INJURY.   Blood culture (routine x 2)     Status: None (Preliminary result)   Collection Time: 08/01/15  6:10 AM  Result Value Ref Range   Specimen Description BLOOD LEFT FOREARM    Special Requests BOTTLES DRAWN AEROBIC AND ANAEROBIC  5CC    Culture PENDING    Report Status PENDING   POC occult blood, ED     Status: Abnormal   Collection Time: 08/01/15  6:20 AM  Result Value Ref Range   Fecal Occult Bld POSITIVE (A) NEGATIVE  I-Stat Chem 8 ED  (not at [facility])     Status: Abnormal   Collection Time: 08/01/15  6:27 AM  Result Value Ref Range   Sodium 138 135 - 145 mmol/L   Potassium 4.0 3.5 - 5.1 mmol/L   Chloride 99 (L) 101 - 111 mmol/L   BUN 54 (H) 6 - 20 mg/dL   Creatinine, Ser 12.60 (H) 0.61 - 1.24 mg/dL   Glucose, Bld 123 (H) 65 - 99 mg/dL   Calcium, Ion 1.01 (L) 1.12 - 1.23 mmol/L   TCO2 26 0 - 100 mmol/L   Hemoglobin 8.2 (L) 13.0 - 17.0 g/dL   HCT 24.0 (L) 39.0 - 52.0 %  I-Stat CG4 Lactic Acid, ED  (not at [facility])     Status: None   Collection Time: 08/01/15  6:27 AM  Result Value Ref Range   Lactic Acid, Venous 1.61 0.5 - 2.0 mmol/L  I-Stat CG4 Lactic Acid, ED  (not at  [facility])     Status: None   Collection Time: 08/01/15  8:17 AM  Result Value Ref Range   Lactic Acid, Venous 1.19 0.5 - 2.0 mmol/L    Ct Abdomen Pelvis W Contrast  08/01/2015  CLINICAL DATA:  55 year old male with a history of right-sided abdominal pain for 3 weeks EXAM: CT ABDOMEN AND PELVIS WITH CONTRAST TECHNIQUE: Multidetector CT imaging of the abdomen and pelvis was  performed using the standard protocol following bolus administration of intravenous contrast. CONTRAST:  1 ISOVUE-300 IOPAMIDOL (ISOVUE-300) INJECTION 61% COMPARISON:  CT 07/21/2015, 09/03/2013 FINDINGS: Lower chest: Unremarkable appearance of the soft tissues of the chest wall. Heart size within normal limits.  No pericardial fluid/thickening. Calcifications of the coronary arteries in the distribution of the left main, circumflex, right coronary arteries. No lower mediastinal adenopathy. Unremarkable appearance of the distal esophagus. No hiatal hernia. No confluent airspace disease, pleural fluid, or pneumothorax within visualized lung. Small pulmonary nodule associated with the minor fissure on the right measures 3 mm. Abdomen/pelvis: Unremarkable appearance of liver, spleen. Unremarkable appearance of bilateral adrenal glands. Unremarkable appearance of the pancreas with no cystic changes. Unremarkable gallbladder. Stigmata of polycystic kidney disease. No right or left-sided hydronephrosis. Punctate calcifications within the left kidney may represent nonobstructive stones versus calcifications. Cecal wall thickening with associated inflammatory changes of the adjacent mesenteric and  trace amount of fluid within the fracture planes. No abscess. No free intraperitoneal air. Background of colonic diverticula with inspissated secretions. Normal appendix. No significant inflammatory changes identified within the distal ileum. Overall the inflammatory changes are similar in appearance to the CT that was performed 07/21/2015. No abnormally distended small bowel or colon. Colonic diverticula extend through the length of the colon. Unremarkable course caliber and contour of the abdominal aorta and iliac vessels. Minimal atherosclerotic changes. No free air.  No pelvic free fluid. Unremarkable appearance of the urinary bladder No displaced fracture. Sclerotic appearance of this musculoskeletal elements, compatible with  renal osteodystrophy. IMPRESSION: Similar appearance of inflammation involving the cecum, which is either unchanged or recurrent from the CT performed 07/21/2015. Favored diagnosis is cecal diverticulitis in the absence of a known history of inflammatory bowel disease. Future correlation with colonoscopy is recommended, as an inflammatory tumor cannot be excluded. Stigmata of polycystic kidney disease with associated renal osteodystrophy. Coronary artery disease. There is a 3 mm nodule associated with the minor fissure. If the patient is at high risk for bronchogenic carcinoma, follow-up chest CT at 1 year is recommended. If the patient is at low risk, no follow-up is needed. This recommendation follows the consensus statement: Guidelines for Management of Small Pulmonary Nodules Detected on CT Scans: A Statement from the [place] as published in Radiology 2005; 237:395-400. These results were called by telephone at the time of interpretation on 08/01/2015 at 7:28 am to Dr. Venora Maples, who verbally acknowledged these results. Signed, Dulcy Fanny. Earleen Newport, DO [facility] Radiology Electronically Signed   By: Corrie Mckusick D.O.   On: 08/01/2015 07:34   Dg Chest Port 1 View  08/01/2015  CLINICAL DATA:  Shortness of breath, syncope EXAM: PORTABLE CHEST 1 VIEW COMPARISON:  02/08/2009.  Chest CT 07/22/2015 FINDINGS: Heart and mediastinal contours are within normal limits. No focal opacities or effusions. No acute bony abnormality. IMPRESSION: No active disease. Electronically Signed   By: Rolm Baptise M.D.   On: 08/01/2015 07:12    Review of Systems  Constitutional: Negative for fever, chills, malaise/fatigue and diaphoresis.  Respiratory: Negative for cough, shortness of breath and wheezing.   Cardiovascular: Negative for chest pain and palpitations.  Gastrointestinal: Positive for abdominal pain and blood in stool. Negative for nausea, vomiting, diarrhea,  constipation and melena.  Neurological: Negative for dizziness, tingling, tremors, sensory change, speech change, focal weakness, seizures, loss of consciousness and weakness.   Blood pressure 88/68, pulse 58, temperature 98.4 F (36.9 C), temperature source Oral, resp. rate 16, SpO2 100 %. Physical Exam  Constitutional: He is oriented to person, place, and time. He appears well-developed and well-nourished. No distress.  Cardiovascular: Normal rate, regular rhythm, normal heart sounds and intact distal pulses.  Exam reveals no gallop and no friction rub.   No murmur heard. Respiratory: Effort normal and breath sounds normal. No respiratory distress. He has no wheezes. He has no rales. He exhibits no tenderness.  GI: Soft. Bowel sounds are normal. He exhibits no mass. There is no rebound and no guarding.  ttp lower abdomen without guarding   Musculoskeletal: Normal range of motion. He exhibits no edema.  Neurological: He is alert and oriented to person, place, and time.  Skin: Skin is warm and dry. No rash noted. He is not diaphoretic. No erythema. No pallor.  Right AV fistula   Psychiatric: He has a normal mood and affect. His behavior is normal. Judgment and thought content normal.  Assessment/Plan: Anemia Lower GI bleeding: the bleeding has not been localized, presumed diverticular. recommend GI consult for a colonoscopy, then IR, then surgery.  Surgery is indicated only if the patient becomes hemodynamically unstable which at this point would be a total colectomy.  Will follow along for now.  Cecal diverticulitis-would recommend IV antibiotics and bowel rest.   Neylan Koroma ANP-BC 08/01/2015, 12:05 PM

## 2015-08-01 NOTE — Progress Notes (Signed)
Dr. Buelah Manis with Nephrology at bedside. Notified of hemoglobin of 6.2. Dr. Buelah Manis and patient agree to give blood products during dialysis treatment. However, we will recheck Hgb at 1700 and evaluate if patient is to receive one unit prior to dialysis treatment. Nurse instructed to page Dr. Buelah Manis with 1700 hgb result to make a finial decision. Nurse will continue to monitor patient closely.

## 2015-08-01 NOTE — Progress Notes (Deleted)
[place] Progress Note Patient Name: Carl Rosenstock Sr. DOB: February 08, 1961 MRN: [id number]   Date of Service  08/01/2015  HPI/Events of Note  Asked to verify NG tube placement. KUB reviewed > NG is in in hiatal hernia above the diaphragm.  eICU Interventions  NG is not cleared to use. Order Reuel Boom Discussed with nurse.     Intervention Category Evaluation Type: Other  Ladarrian Asencio 08/01/2015, 3:32 PM

## 2015-08-02 ENCOUNTER — Ambulatory Visit: Payer: Medicare Other | Admitting: Gastroenterology

## 2015-08-02 ENCOUNTER — Encounter (HOSPITAL_COMMUNITY): Payer: Self-pay | Admitting: *Deleted

## 2015-08-02 DIAGNOSIS — I959 Hypotension, unspecified: Secondary | ICD-10-CM

## 2015-08-02 DIAGNOSIS — K921 Melena: Secondary | ICD-10-CM

## 2015-08-02 LAB — GLUCOSE, CAPILLARY
Glucose-Capillary: 81 mg/dL (ref 65–99)
Glucose-Capillary: 78 mg/dL (ref 65–99)

## 2015-08-02 LAB — BASIC METABOLIC PANEL
Anion gap: 9 (ref 5–15)
BUN: 17 mg/dL (ref 6–20)
CO2: 28 mmol/L (ref 22–32)
Calcium: 7.6 mg/dL — ABNORMAL LOW (ref 8.9–10.3)
Chloride: 98 mmol/L — ABNORMAL LOW (ref 101–111)
Creatinine, Ser: 6.04 mg/dL — ABNORMAL HIGH (ref 0.61–1.24)
GFR calc Af Amer: 11 mL/min — ABNORMAL LOW (ref >60)
GFR calc non Af Amer: 9 mL/min — ABNORMAL LOW (ref >60)
Glucose, Bld: 82 mg/dL (ref 65–99)
Potassium: 3.6 mmol/L (ref 3.5–5.1)
Sodium: 135 mmol/L (ref 135–145)

## 2015-08-02 LAB — TYPE AND SCREEN
ABO/RH(D): O POS
Antibody Screen: NEGATIVE
Unit division: 0
Unit division: 0

## 2015-08-02 LAB — CBC
HCT: 23.3 % — ABNORMAL LOW (ref 39.0–52.0)
HCT: 23.4 % — ABNORMAL LOW (ref 39.0–52.0)
Hemoglobin: 7.6 g/dL — ABNORMAL LOW (ref 13.0–17.0)
Hemoglobin: 7.6 g/dL — ABNORMAL LOW (ref 13.0–17.0)
MCH: 28.8 pg (ref 26.0–34.0)
MCH: 28.8 pg (ref 26.0–34.0)
MCHC: 32.5 g/dL (ref 30.0–36.0)
MCHC: 32.6 g/dL (ref 30.0–36.0)
MCV: 88.3 fL (ref 78.0–100.0)
MCV: 88.6 fL (ref 78.0–100.0)
Platelets: 209 10*3/uL (ref 150–400)
Platelets: 218 10*3/uL (ref 150–400)
RBC: 2.64 MIL/uL — ABNORMAL LOW (ref 4.22–5.81)
RBC: 2.64 MIL/uL — ABNORMAL LOW (ref 4.22–5.81)
RDW: 16.3 % — ABNORMAL HIGH (ref 11.5–15.5)
RDW: 16.4 % — ABNORMAL HIGH (ref 11.5–15.5)
WBC: 7.8 10*3/uL (ref 4.0–10.5)
WBC: 7.9 10*3/uL (ref 4.0–10.5)

## 2015-08-02 LAB — MAGNESIUM: Magnesium: 1.7 mg/dL (ref 1.7–2.4)

## 2015-08-02 MED ORDER — MORPHINE SULFATE (PF) 2 MG/ML IV SOLN
2.0000 mg | INTRAVENOUS | Status: DC | PRN
  Administered 2015-08-02 (×3): 2 mg via INTRAVENOUS
  Filled 2015-08-02 (×2): qty 1

## 2015-08-02 MED ORDER — PIPERACILLIN-TAZOBACTAM IN DEX 2-0.25 GM/50ML IV SOLN
2.2500 g | Freq: Three times a day (TID) | INTRAVENOUS | Status: DC
  Administered 2015-08-02 – 2015-08-09 (×20): 2.25 g via INTRAVENOUS
  Filled 2015-08-02 (×27): qty 50

## 2015-08-02 MED ORDER — MAGNESIUM SULFATE 2 GM/50ML IV SOLN
2.0000 g | Freq: Once | INTRAVENOUS | Status: AC
  Administered 2015-08-02: 2 g via INTRAVENOUS
  Filled 2015-08-02: qty 50

## 2015-08-02 MED ORDER — CETYLPYRIDINIUM CHLORIDE 0.05 % MT LIQD
7.0000 mL | Freq: Two times a day (BID) | OROMUCOSAL | Status: DC
  Administered 2015-08-02 – 2015-08-09 (×8): 7 mL via OROMUCOSAL

## 2015-08-02 MED ORDER — DARBEPOETIN ALFA 200 MCG/0.4ML IJ SOSY
200.0000 ug | PREFILLED_SYRINGE | INTRAMUSCULAR | Status: DC
  Administered 2015-08-03 – 2015-08-10 (×2): 200 ug via INTRAVENOUS
  Filled 2015-08-02 (×3): qty 0.4

## 2015-08-02 MED ORDER — MORPHINE SULFATE (PF) 2 MG/ML IV SOLN
INTRAVENOUS | Status: AC
  Filled 2015-08-02: qty 1

## 2015-08-02 NOTE — Progress Notes (Signed)
PULMONARY / CRITICAL CARE MEDICINE   Name: Carl Rouland Sr. MRN: [id number] DOB: July 23, 1960    ADMISSION DATE:  08/01/2015 CONSULTATION DATE:  08/01/2015  REFERRING MD:  [name]  CHIEF COMPLAINT:  GI bleed  HISTORY OF PRESENT ILLNESS:   Mr. Carl Normann Sr. is a 55 y.o. male w/ PMHx of ESRD on HD, OSA, HTN, anemia, and recent admission for diverticulitis, presents to the ED w/ complaints of syncope and rectal bleeding. Patient is a Games developer. Bleeding started this AM as what he thought was diarrhea, but realized there was copious amounts of bright red blood in his stool. He has had a recent admission for diverticulitis (discharged 6/10) and was sent home with Cipro and Flagyl for total of 14 day course (ending 08/04/15). He continues to have RLQ pain, but denies fever, chills, nausea, or vomiting. Risks of not accepting blood products 2/2 Jehovah's Witness faith discussed with patient on admit. Patient agreed that if he develops signs of hypovolemic shock requiring pressors or has Hb < 7, he will in fact accept blood products.    SUBJECTIVE:  Transfused 2 units PRBC's with HD 6/19 for Hb 6.2. Last stool was yesterday at 1600. Hb 7.6 today. Only complains of headache. Denies any abdominal pain.  Hemodynamically stable.   VITAL SIGNS: BP 106/76 mmHg  Pulse 76  Temp(Src) 98.7 F (37.1 C) (Oral)  Resp 15  Wt 222 lb 10.6 oz (101 kg)  SpO2 99%   INTAKE / OUTPUT: I/O last 3 completed shifts: In: 1100 [I.V.:1000; IV Piggyback:100] Out: -870   PHYSICAL EXAMINATION: General: AA male, sitting up in bed, NAD.  Neuro: A&O x3. Moves all extremities spontaneously. No focal weakness or sensory deficits.  HEENT: PERRL, EOMI. Pale moist mucus membranes.  Cardiovascular: RRR, no murmurs, gallops, or rubs. Pulses 2+, no edema Lungs: Clear to auscultation bilaterally. No wheezes, rales, rhonchi.  Abdomen: Soft, nontender, nondistended abd. Hyper BS.  Musculoskeletal: No  muscle pain, no edema. RUE AVF with good thrill and bruit.  Skin: No rashes. Skin warm and dry.   LABS:  BMET  Recent Labs Lab 08/01/15 0610 08/01/15 0627 08/02/15 0730  NA 137 138 135  K 4.1 4.0 3.6  CL 102 99* 98*  CO2 22  --  28  BUN 57* 54* 17  CREATININE 12.03* 12.60* 6.04*  GLUCOSE 126* 123* 82    Electrolytes  Recent Labs Lab 08/01/15 0610 08/02/15 0730  CALCIUM 7.9* 7.6*  MG  --  1.7    CBC  Recent Labs Lab 08/01/15 1823 08/01/15 2348 08/02/15 0730  WBC 10.9* 9.1 7.9  7.8  HGB 6.6* 7.3* 7.6*  7.6*  HCT 20.7* 22.8* 23.4*  23.3*  PLT 259 211 218  209    Coag's  Recent Labs Lab 08/01/15 0610  INR 1.14    Sepsis Markers  Recent Labs Lab 08/01/15 0627 08/01/15 0817 08/01/15 1204  LATICACIDVEN 1.61 1.19 1.37    Liver Enzymes  Recent Labs Lab 08/01/15 0610  AST 14*  ALT 11*  ALKPHOS 61  BILITOT 0.2*  ALBUMIN 2.2*    Cardiac Enzymes  Recent Labs Lab 08/01/15 0610  TROPONINI 0.03    Imaging No results found.   STUDIES:  CT Abd 08/01/15 >> Cecal inflammation suggestive of diverticulitis, seen on previous scan.   CULTURES: Blood 6/19 >>  ANTIBIOTICS: Zosyn 6/19 >>   SIGNIFICANT EVENTS: 6/19  Admit  LINES/TUBES: PIV 6/19  DISCUSSION: 55 y/o M w/ PMHx of ESRD on HD, Jehovah's  Witness, presented to the ED with syncope and rectal bleeding.   ASSESSMENT / PLAN:  PULMONARY A: No acute issues P:   Supplemental O2 prn Pulmonary hygiene - IS, mobilize as able  CARDIOVASCULAR A:  Hypotension - resolved Hypovolemia - resolved P:  MAP goal > 65, may need to lower MAP goal with hx of soft normal BP's Patient agreed to transfusion of blood products if pressors are needed to support BP NS @ KVO  RENAL A:   ESRD on HD Hypomagnesemia  P:   Renal following Trend BMP   Replace electrolytes as indicated 2gm Mg + now  GASTROINTESTINAL A:   GI Bleed - likely diverticular Recent Cecal Diverticulitis -  last colonoscopy 2013 with diverticuli throughout the colon.  Refused colectomy per [facility]. P:   Protonix IV bid Consult surgery No heparin / anticoagulants given concerns for GIB GI Consulted  HEMATOLOGIC A:   Acute Blood Loss Anemia - s/p DDAVP x1, Feraheme 1020mg  x1 Leukocytosis P:  Trend CBC Transfuse for Hb < 7 as agreed upon by patient Folic acid 1 mg IV qd ABx as below   INFECTIOUS A:   Recent Diverticulitis P:   Zosyn with end date 6/22  ENDOCRINE A:   No acute issues   P:   Monitor CBG's on BMP  NEUROLOGIC A:   Syncope 2/2 acute blood loss P:   Serial neuro exams    FAMILY  - Updates: Patient & family updated at bedside 6/20   Carl Gens, NP-C [place] Pgr: [phone] or if no answer [phone] 08/02/2015, 12:01 PM  Attending Note:  55 year old male Jehovah's wittness who presents to the ICU with lower GI bleeding and hypotension.  Patient improved with transfusion and dialysis.  On exam, he is appropriate, lungs are clear and neuro is intact.  I reviewed CXR myself, no acute disease.  Will consult GI to see.  Discussed with GI and PCCM-NP.  GI Bleed:  - GI consult.  - CBC.  - Transfusion as needed.  Hypotension:  - Transfuse.  - IVF.  - D/C pressors.  Diverticulitis:  - Continue zosyn, stop-date 6/22.  - F/U on cultures.  Hypomag:  - Replace mg.  - Check BMET in AM.  Hypoxemia:  - Titrate O2 for sat of 88-92%.  - If unable to titrate off then will need an ambulatory desaturation study prior to discharge.  Disposition:   - Transfer to tele.  - Transfer care to [facility] and PCCM off 6/21.  Patient seen and examined, agree with above note.  I dictated the care and orders written for this patient under my direction.  Carl Farmer, MD [phone]

## 2015-08-02 NOTE — Progress Notes (Signed)
[facility] Rounding Note Subjective/Interval Events  Had HD last evening Transfused 2 units PRBC's with HD last night Hb 7.6 this AM No more stools    Objective Vital signs in last 24 hours: Filed Vitals:   08/02/15 0400 08/02/15 0500 08/02/15 0600 08/02/15 0700  BP: 94/65 97/67 102/74   Pulse: 99 83 81 77  Temp:      TempSrc:      Resp: 11 13 14 11   Weight: 101 kg (222 lb 10.6 oz)     SpO2: 98% 99% 98% 97%   Weight change:   Physical Exam: Pleasant AAM, NAD BP 102/74 mmHg  Pulse 77  Temp(Src) 98.6 F (37 C) (Oral)  Resp 11  Wt 101 kg (222 lb 10.6 oz)  SpO2 97% Lungs clear Regular rhythm. Normal S1S2 No S3 Abd + BS, not hyperactive, no focal tenderness Aneurysmal R AVF + bruit No LE edema. SCD's in place    Labs: Basic Metabolic Panel:  Recent Labs Lab 08/01/15 0610 08/01/15 0627  NA 137 138  K 4.1 4.0  CL 102 99*  CO2 22  --   GLUCOSE 126* 123*  BUN 57* 54*  CREATININE 12.03* 12.60*  CALCIUM 7.9*  --      Recent Labs Lab 08/01/15 0610  AST 14*  ALT 11*  ALKPHOS 61  BILITOT 0.2*  PROT 6.0*  ALBUMIN 2.2*    Recent Labs Lab 08/01/15 0610  LIPASE 124*     Recent Labs Lab 08/01/15 0610  08/01/15 1148 08/01/15 1322 08/01/15 1823 08/01/15 2348 08/02/15 0730  WBC 15.3*  --  13.4* 15.2* 10.9* 9.1 7.9  7.8  NEUTROABS 12.2*  --   --   --   --   --   --   HGB 7.6*  < > 6.8* 6.2* 6.6* 7.3* 7.6*  7.6*  HCT 24.6*  < > 21.9* 19.9* 20.7* 22.8* 23.4*  23.3*  MCV 89.5  --  89.8 88.8 89.6 88.0 88.6  88.3  PLT 344  --  273 266 259 211 218  209  < > = values in this interval not displayed.   Recent Labs Lab 08/01/15 0610  TROPONINI 0.03    Medications:   . sodium chloride   Intravenous Once  . [START ON 08/05/2015] darbepoetin (ARANESP) injection - DIALYSIS  100 mcg Intravenous Q Fri-HD  . [START ON 08/03/2015] doxercalciferol  2 mcg Intravenous Q M,W,F-HD  . ferumoxytol  510 mg Intravenous Q Mon  . folic acid  1 mg Intravenous Daily  .  pantoprazole (PROTONIX) IV  40 mg Intravenous Q12H  . piperacillin-tazobactam (ZOSYN)  IV  2.25 g Intravenous Q8H   Dialysis Orders:  Center: [facility] MWF  4.5 hours  180  500 A 1.5  EDW 96.5  2K 2 Ca  right lower AVF  Hectorol 2  heparin 5000 will now be discontinued indefinitely Mircera 50 q 2 last  On 07/27/15  Recent labs: hgb 10.0  On 6/14  21% sat ferritin 1476   iPTH 1242   P 5.6 Ca 8.6   Background 55 yo AAM with known diverticulosis, recent admission for cecal diverticulitis (6/8-6/10) readmitted with LGIB presumably diverticular in origin.    Problem/Plan: 1.  LGIB - no further bleeding overnight. Transfused 2 units with HD yesterday. Hb 7.6 this AM. Hope has stopped bleeding. Further management per primary team.  2. ESRD - HD MWF - no heparin indefinitely 3. Anemia - ESRD + ABLA - Hb 7.6 this AM. Last  Mircera 6/14. Dose Aranesp 200 next HD. (Not sure why getting weekly Feraheme - will discontinue) 4. Secondary hyperparathyroidism - PTH >1,000 . Hectorol with HD + sensipar 180 when taking po. Binders when eating   5. HTN/volume -Soft BP's 6. 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

## 2015-08-02 NOTE — Consult Note (Signed)
[facility] Consult: 12:45 PM 08/02/2015  LOS: 1 day    Referring Provider: Dr Titus Mould  Primary Care Physician:  Lucrezia Starch, MD Primary Gastroenterologist:  Dr. Deatra Ina.     Reason for Consultation:  Hematochezia.    HPI: Carl Bucco Sr. is a 55 y.o. male.  Hx ESRD, on HD.  HTN.  OSA. Anemia.  Hypertrophic pachymeningitis.  Secondary HPTH.     07/2010 Colonoscopy, avg risk screening, Dr Deatra Ina.  Moderate diverticulosis throughout.   6/8 - 07/23/15 admission for cecal diverticulitis (confirmed by CT) , RLQ pain, vomiting. Treated with IV cipro/flagyl during admission.  Discharged on oral cipro/flagyl.   Admitted with bleeding PR and syncope. Ate large dinner on 6/18, felt fine, went to bed.  Awakened 2 AM on 6/19 with first of 5 large episodes of hematochezia through 5AM. Got dizzy and had 10 second episode of syncope prior to transport to ED.  No new abd pain.  Diffuse, more intense abdominal pain at time of initial diverticulitis has steadily improved and just tinges of RLQ pain remain.  Compliant with ongoing abx (finishes 6/22).  Though he is a JW, has agreed to transfusion and got 2 PRBCs during HD last evening.  Hgb 7.7.  Nadir of 6.2.  Was 11.8 to 10.2. During recent admission. MCV, Coags normal.  Repeat CT ab/pelvis 6/19: Similar appearance of inflammation involving the cecum, which is either unchanged or recurrent from the CT performed 07/21/2015. Favored diagnosis is cecal diverticulitis in the absence of a known history of inflammatory bowel disease. Future correlation with colonoscopy is recommended, as an inflammatory tumor cannot be excluded. Small lung nodule also noted, present on study of 6/8 with plans for 12 month follow up imaging.   CCM has started Zosyn.  Pt has had no fever or  chills, appetite has been good.  No N/V.  Has small, old blood/dark stool last evening, no BM since.  No recurrent dizziness.        Past Medical History  Diagnosis Date  . Hypertension   . Anemia   . Chronic headaches   . Sleep apnea   . Chronic kidney disease     Chronic Renal Failure; On Renal Transplant List  . Presence of arteriovenous fistula for hemodialysis, primary ([place])     RUE  . Hemodialysis patient ([place])     M-W-F  . Stroke ([place])   . GI bleed   . Diverticulitis   . Refusal of blood product     NO WHOLE BLOOD PROUCTS    Past Surgical History  Procedure Laterality Date  . Av fistula placement Right     right arm  . Graft left arm Left     for dialysis x 2  . Insertion of dialysis catheter      Rt chest    Prior to Admission medications   Medication Sig Start Date End Date Taking? Authorizing Provider  aspirin EC 81 MG tablet Take 1 tablet (81 mg total) by mouth daily. 08/26/14  Yes Rosalin Hawking, MD  atorvastatin (LIPITOR) 10  MG tablet Take 1 tablet (10 mg total) by mouth daily at 6 PM. 05/25/14  Yes Thurnell Lose, MD  calcium acetate (PHOSLO) 667 MG capsule Take 2,001 mg by mouth 3 (three) times daily with meals.    Yes Historical Provider, MD  cinacalcet (SENSIPAR) 90 MG tablet Take 90 mg by mouth daily.   Yes Historical Provider, MD  folic acid-vitamin b complex-vitamin c-selenium-zinc (DIALYVITE) 3 MG TABS tablet Take 1 tablet by mouth daily.   Yes Historical Provider, MD  gabapentin (NEURONTIN) 100 MG capsule Take 100 mg by mouth 2 (two) times daily.  09/29/14  Yes Historical Provider, MD  ibuprofen (ADVIL,MOTRIN) 800 MG tablet Take 800 mg by mouth 2 (two) times daily.   Yes Historical Provider, MD  lanthanum (FOSRENOL) 1000 MG chewable tablet Chew 1,000 mg by mouth 3 (three) times daily with meals.   Yes Historical Provider, MD  sorbitol 70 % solution Take 15-60 mLs by mouth daily as needed (dialysis days).    Yes Historical Provider, MD    butalbital-acetaminophen-caffeine (FIORICET) 50-325-40 MG tablet Take 1 tablet by mouth every 6 (six) hours as needed for headache. Patient not taking: Reported on 08/01/2015 02/05/15 02/05/16  Okey Regal, PA-C  ciprofloxacin (CIPRO) 500 MG tablet Take 1 tablet (500 mg total) by mouth daily. Patient not taking: Reported on 08/01/2015 07/23/15   Norman Herrlich, MD  metroNIDAZOLE (FLAGYL) 500 MG tablet Take 1 tablet (500 mg total) by mouth 3 (three) times daily. Patient not taking: Reported on 08/01/2015 07/23/15   Norman Herrlich, MD    Scheduled Meds: . sodium chloride   Intravenous Once  . [START ON 08/03/2015] darbepoetin (ARANESP) injection - DIALYSIS  200 mcg Intravenous Q Wed-HD  . [START ON 08/03/2015] doxercalciferol  2 mcg Intravenous Q M,W,F-HD  . folic acid  1 mg Intravenous Daily  . magnesium sulfate 1 - 4 g bolus IVPB  2 g Intravenous Once  . pantoprazole (PROTONIX) IV  40 mg Intravenous Q12H  . piperacillin-tazobactam (ZOSYN)  IV  2.25 g Intravenous Q8H   Infusions:   PRN Meds: sodium chloride, morphine injection   Allergies as of 08/01/2015 - Review Complete 08/01/2015  Allergen Reaction Noted  . Infed [iron dextran] Other (See Comments) 06/19/2010  . Oxycodone Nausea Only 06/19/2010  . Vicodin [hydrocodone-acetaminophen] Other (See Comments) 06/19/2010  . Diclofenac Other (See Comments) 07/01/2014  . Whole blood Other (See Comments) 07/01/2014    Family History  Problem Relation Age of Onset  . Diabetes Father   . Stroke Father   . Hypertension Father   . Uterine cancer Mother   . Lupus Sister   . Stroke Sister   . Anuerysm Brother     brain  . Hypertension Sister     Social History   Social History  . Marital Status: Single    Spouse Name: N/A  . Number of Children: 2  . Years of Education: N/A   Occupational History  . retired    Social History Main Topics  . Smoking status: Former Smoker    Quit date: 06/19/1990  . Smokeless tobacco: Never  Used  . Alcohol Use: 0.6 oz/week    1 Cans of beer per week     Comment: occasional  . Drug Use: No  . Sexual Activity: Not on file   Other Topics Concern  . Not on file   Social History Narrative   Lives alone at home   12 years of education  2 children    Not married        REVIEW OF SYSTEMS: Constitutional:  Feels well.  ENT:  No nose bleeds Pulm:  No cough or dyspnea CV:  No palpitations, no LE edema.  GU:  No hematuria, no frequency GI:  Episode of dysphagia to solids last week, none since Heme:  No unusual bleeding or bruising.    Transfusions:  Last night Neuro:  No headaches, no peripheral tingling or numbness Derm:  No itching, no rash or sores.  Endocrine:  No sweats or chills.  No polyuria or dysuria Immunization:  Not queried Travel:  None beyond local counties in last few months.    PHYSICAL EXAM: Vital signs in last 24 hours: Filed Vitals:   08/02/15 1138 08/02/15 1200  BP:  102/76  Pulse:  83  Temp: 98.7 F (37.1 C)   Resp:  19   Wt Readings from Last 3 Encounters:  08/02/15 101 kg (222 lb 10.6 oz)  07/22/15 95.2 kg (209 lb 14.1 oz)  05/17/15 95.255 kg (210 lb)    General: pleasant, looks well.  Comfortable.  Head:  No swelling or asymmetry.   Eyes:  No icterus or pallor Ears:  Not HOH  Nose:  No congestion or discharge Mouth:  Clear and moist oral mm.  Neck:  No mass, no TMG, no JVD Lungs:  Clear bil .  Non-labored breathing. Heart: RRR.  No mrg. Abdomen:  Soft, NT, active BS.  ND.  Marland Kitchen   Rectal: deferred   Musc/Skeltl: no joint swelling or deformity Extremities:  No CCE,  AV graft on right arm with thrill.  Neurologic:  Oriented x 3.  No tremor.  No limb weakness.   Skin:  No rash or sores Tattoos:  None seen   Psych:  Pleasant, cooperative, calm, in good spirits.   Intake/Output from previous day: 06/19 0701 - 06/20 0700 In: 100 [IV Piggyback:100] Out: -870  Intake/Output this shift: Total I/O In: 50 [IV Piggyback:50] Out: -    LAB RESULTS:  Recent Labs  08/01/15 1823 08/01/15 2348 08/02/15 0730  WBC 10.9* 9.1 7.9  7.8  HGB 6.6* 7.3* 7.6*  7.6*  HCT 20.7* 22.8* 23.4*  23.3*  PLT 259 211 218  209   BMET Lab Results  Component Value Date   NA 135 08/02/2015   NA 138 08/01/2015   NA 137 08/01/2015   K 3.6 08/02/2015   K 4.0 08/01/2015   K 4.1 08/01/2015   CL 98* 08/02/2015   CL 99* 08/01/2015   CL 102 08/01/2015   CO2 28 08/02/2015   CO2 22 08/01/2015   CO2 26 07/22/2015   GLUCOSE 82 08/02/2015   GLUCOSE 123* 08/01/2015   GLUCOSE 126* 08/01/2015   BUN 17 08/02/2015   BUN 54* 08/01/2015   BUN 57* 08/01/2015   CREATININE 6.04* 08/02/2015   CREATININE 12.60* 08/01/2015   CREATININE 12.03* 08/01/2015   CALCIUM 7.6* 08/02/2015   CALCIUM 7.9* 08/01/2015   CALCIUM 7.6* 07/22/2015   LFT  Recent Labs  08/01/15 0610  PROT 6.0*  ALBUMIN 2.2*  AST 14*  ALT 11*  ALKPHOS 61  BILITOT 0.2*   PT/INR Lab Results  Component Value Date   INR 1.14 08/01/2015   INR 1.04 07/01/2014   INR 1.03 05/22/2014   Hepatitis Panel No results for input(s): HEPBSAG, HCVAB, HEPAIGM, HEPBIGM in the last 72 hours. C-Diff No components found for: CDIFF Lipase     Component Value Date/Time  LIPASE 124* 08/01/2015 0610    Drugs of Abuse  No results found for: LABOPIA, COCAINSCRNUR, LABBENZ, AMPHETMU, THCU, LABBARB   RADIOLOGY STUDIES: Ct Abdomen Pelvis W Contrast  08/01/2015  CLINICAL DATA:  55 year old male with a history of right-sided abdominal pain for 3 weeks EXAM: CT ABDOMEN AND PELVIS WITH CONTRAST TECHNIQUE: Multidetector CT imaging of the abdomen and pelvis was performed using the standard protocol following bolus administration of intravenous contrast. CONTRAST:  1 ISOVUE-300 IOPAMIDOL (ISOVUE-300) INJECTION 61% COMPARISON:  CT 07/21/2015, 09/03/2013 FINDINGS: Lower chest: Unremarkable appearance of the soft tissues of the chest wall. Heart size within normal limits.  No pericardial  fluid/thickening. Calcifications of the coronary arteries in the distribution of the left main, circumflex, right coronary arteries. No lower mediastinal adenopathy. Unremarkable appearance of the distal esophagus. No hiatal hernia. No confluent airspace disease, pleural fluid, or pneumothorax within visualized lung. Small pulmonary nodule associated with the minor fissure on the right measures 3 mm. Abdomen/pelvis: Unremarkable appearance of liver, spleen. Unremarkable appearance of bilateral adrenal glands. Unremarkable appearance of the pancreas with no cystic changes. Unremarkable gallbladder. Stigmata of polycystic kidney disease. No right or left-sided hydronephrosis. Punctate calcifications within the left kidney may represent nonobstructive stones versus calcifications. Cecal wall thickening with associated inflammatory changes of the adjacent mesenteric and trace amount of fluid within the fracture planes. No abscess. No free intraperitoneal air. Background of colonic diverticula with inspissated secretions. Normal appendix. No significant inflammatory changes identified within the distal ileum. Overall the inflammatory changes are similar in appearance to the CT that was performed 07/21/2015. No abnormally distended small bowel or colon. Colonic diverticula extend through the length of the colon. Unremarkable course caliber and contour of the abdominal aorta and iliac vessels. Minimal atherosclerotic changes. No free air.  No pelvic free fluid. Unremarkable appearance of the urinary bladder No displaced fracture. Sclerotic appearance of this musculoskeletal elements, compatible with renal osteodystrophy. IMPRESSION: Similar appearance of inflammation involving the cecum, which is either unchanged or recurrent from the CT performed 07/21/2015. Favored diagnosis is cecal diverticulitis in the absence of a known history of inflammatory bowel disease. Future correlation with colonoscopy is recommended, as an  inflammatory tumor cannot be excluded. Stigmata of polycystic kidney disease with associated renal osteodystrophy. Coronary artery disease. There is a 3 mm nodule associated with the minor fissure. If the patient is at high risk for bronchogenic carcinoma, follow-up chest CT at 1 year is recommended. If the patient is at low risk, no follow-up is needed. This recommendation follows the consensus statement: Guidelines for Management of Small Pulmonary Nodules Detected on CT Scans: A Statement from the [place] as published in Radiology 2005; 237:395-400. These results were called by telephone at the time of interpretation on 08/01/2015 at 7:28 am to Dr. Venora Maples, who verbally acknowledged these results. Signed, Dulcy Fanny. Earleen Newport, DO [facility] Electronically Signed   By: Corrie Mckusick D.O.   On: 08/01/2015 07:34   Dg Chest Port 1 View  08/01/2015  CLINICAL DATA:  Shortness of breath, syncope EXAM: PORTABLE CHEST 1 VIEW COMPARISON:  02/08/2009.  Chest CT 07/22/2015 FINDINGS: Heart and mediastinal contours are within normal limits. No focal opacities or effusions. No acute bony abnormality. IMPRESSION: No active disease. Electronically Signed   By: Rolm Baptise M.D.   On: 08/01/2015 07:12    ENDOSCOPIC STUDIES: Per HPI  IMPRESSION:   * Sudden, painless hematochezia.  Suspect diverticular bleed.  Lack of abdominal pain intensification weighs against  ischemic colitis (and would be unusual to see in cecum).  Suppose this could be IBD but doubt this.   *  Cecal diverticulitis, improved on 2 week course cipro/flagyl.  Though clinically pt has improved,the CT reveals little change in cecal diverticulitis.      *  ABL anemia.  S/p PRBC x 2.  *  ESRD on HD MWF.       PLAN:     *  Per Dr Hilarie Fredrickson.  ? Repeat colonoscopy?  Continue Zosyn for now.  Ok to have clears.    Azucena Freed  08/02/2015, 12:45 PM Pager: [phone]

## 2015-08-02 NOTE — Progress Notes (Signed)
Admission note:  Arrival Method: Patient arrived in bed from 43M accompanied by the staff. Mental Orientation:Alert and oriented x 4. Telemetry: 6E-29, NSR Assessment: See doc flow sheets. Skin: Warm, dry and intact, assessed by two nurses (Miranda) IV: Left forearm and left hand, saline lock. Pain: Denies any pain currently. Tubes: N/A Safety Measures: Bed in low position, call bell and phone within reach. Fall Prevention Safety Plan: Reviewed the plan, understood and acknowledged. Admission Screening: In progress. 6700 Orientation: Patient has been oriented to the unit, staff and to the room.

## 2015-08-02 NOTE — Care Management Note (Signed)
Case Management Note  Patient Details  Name: Tyjai Armenia Sr. MRN: [id number] Date of Birth: 10/27/1960  Subjective/Objective:                  55 year old male with a history of CKD and diverticular disease who presents with rectal bleeding and syncope. Pt is  [employer]ehovah's witness; dialysis. /From home alone.  Action/Plan: Follow for disposition needs.   Expected Discharge Date:  08/03/15               Expected Discharge Plan:  [place]  In-House Referral:     Discharge planning Services  CM Consult  Post Acute Care Choice:    Choice offered to:     DME Arranged:    DME Agency:     HH Arranged:    HH Agency:     Status of Service:  In process, will continue to follow  Medicare Important Message Given:    Date Medicare IM Given:    Medicare IM give by:    Date Additional Medicare IM Given:    Additional Medicare Important Message give by:     If discussed at [place] of Stay Meetings, dates discussed:    Additional Comments: 08/02/2015 Pt alert and oriented.  Pt is from home with adult son - independent.  ESRD goes to HD but still drives.  Pt denied CM needs.  CM will continue to monitor for disposition needs Maryclare Labrador, RN 08/02/2015, 11:50 AM

## 2015-08-02 NOTE — Progress Notes (Signed)
Dialysis treatment completed.  130 mL ultrafiltrated and net fluid removal -870 mL.    Patient status unchanged. Lung sounds clear to ausculation in all fields. generalized edema. Cardiac: NSR.  Disconnected lines and removed needles.  Pressure held for 10 minutes and band aid/gauze dressing applied.  Report given to bedside RN, Olean Ree.  2 units PRBC infused during treatment.  Post CBC collected.

## 2015-08-03 DIAGNOSIS — N186 End stage renal disease: Secondary | ICD-10-CM

## 2015-08-03 DIAGNOSIS — I63312 Cerebral infarction due to thrombosis of left middle cerebral artery: Secondary | ICD-10-CM

## 2015-08-03 DIAGNOSIS — Z992 Dependence on renal dialysis: Secondary | ICD-10-CM

## 2015-08-03 LAB — BASIC METABOLIC PANEL
Anion gap: 13 (ref 5–15)
BUN: 23 mg/dL — ABNORMAL HIGH (ref 6–20)
CO2: 27 mmol/L (ref 22–32)
Calcium: 8.6 mg/dL — ABNORMAL LOW (ref 8.9–10.3)
Chloride: 97 mmol/L — ABNORMAL LOW (ref 101–111)
Creatinine, Ser: 8.42 mg/dL — ABNORMAL HIGH (ref 0.61–1.24)
GFR calc Af Amer: 7 mL/min — ABNORMAL LOW (ref >60)
GFR calc non Af Amer: 6 mL/min — ABNORMAL LOW (ref >60)
Glucose, Bld: 80 mg/dL (ref 65–99)
Potassium: 4.4 mmol/L (ref 3.5–5.1)
Sodium: 137 mmol/L (ref 135–145)

## 2015-08-03 LAB — CBC
HCT: 23.9 % — ABNORMAL LOW (ref 39.0–52.0)
Hemoglobin: 7.6 g/dL — ABNORMAL LOW (ref 13.0–17.0)
MCH: 28 pg (ref 26.0–34.0)
MCHC: 31.8 g/dL (ref 30.0–36.0)
MCV: 88.2 fL (ref 78.0–100.0)
Platelets: 242 10*3/uL (ref 150–400)
RBC: 2.71 MIL/uL — ABNORMAL LOW (ref 4.22–5.81)
RDW: 16.2 % — ABNORMAL HIGH (ref 11.5–15.5)
WBC: 8.6 10*3/uL (ref 4.0–10.5)

## 2015-08-03 MED ORDER — GABAPENTIN 100 MG PO CAPS
100.0000 mg | ORAL_CAPSULE | Freq: Two times a day (BID) | ORAL | Status: DC
  Administered 2015-08-03 – 2015-08-10 (×15): 100 mg via ORAL
  Filled 2015-08-03 (×15): qty 1

## 2015-08-03 MED ORDER — METOCLOPRAMIDE HCL 5 MG/ML IJ SOLN
10.0000 mg | Freq: Once | INTRAMUSCULAR | Status: AC
  Administered 2015-08-03: 10 mg via INTRAVENOUS
  Filled 2015-08-03: qty 2

## 2015-08-03 MED ORDER — CINACALCET HCL 30 MG PO TABS
90.0000 mg | ORAL_TABLET | Freq: Two times a day (BID) | ORAL | Status: DC
  Administered 2015-08-03 – 2015-08-10 (×14): 90 mg via ORAL
  Filled 2015-08-03 (×16): qty 3

## 2015-08-03 MED ORDER — PEG-KCL-NACL-NASULF-NA ASC-C 100 G PO SOLR
0.5000 | Freq: Once | ORAL | Status: AC
  Administered 2015-08-03: 100 g via ORAL
  Filled 2015-08-03: qty 1

## 2015-08-03 MED ORDER — PEG-KCL-NACL-NASULF-NA ASC-C 100 G PO SOLR
0.5000 | Freq: Once | ORAL | Status: AC
  Administered 2015-08-04: 100 g via ORAL
  Filled 2015-08-03: qty 1

## 2015-08-03 MED ORDER — DARBEPOETIN ALFA 200 MCG/0.4ML IJ SOSY
PREFILLED_SYRINGE | INTRAMUSCULAR | Status: AC
  Filled 2015-08-03: qty 0.4

## 2015-08-03 MED ORDER — DOXERCALCIFEROL 4 MCG/2ML IV SOLN
INTRAVENOUS | Status: AC
  Filled 2015-08-03: qty 2

## 2015-08-03 MED ORDER — BISACODYL 5 MG PO TBEC
10.0000 mg | DELAYED_RELEASE_TABLET | Freq: Once | ORAL | Status: AC
  Administered 2015-08-03: 10 mg via ORAL
  Filled 2015-08-03: qty 2

## 2015-08-03 MED ORDER — OXYCODONE-ACETAMINOPHEN 7.5-325 MG PO TABS
1.0000 | ORAL_TABLET | Freq: Four times a day (QID) | ORAL | Status: DC | PRN
  Administered 2015-08-03 – 2015-08-04 (×4): 1 via ORAL
  Filled 2015-08-03 (×5): qty 1

## 2015-08-03 MED ORDER — HYDROMORPHONE HCL 1 MG/ML IJ SOLN: 1.0000 mg | INTRAMUSCULAR | Status: DC | PRN

## 2015-08-03 MED ORDER — PEG-KCL-NACL-NASULF-NA ASC-C 100 G PO SOLR: 1.0000 | Freq: Once | ORAL | Status: DC

## 2015-08-03 MED ORDER — METOCLOPRAMIDE HCL 5 MG/ML IJ SOLN
10.0000 mg | Freq: Once | INTRAMUSCULAR | Status: AC
  Administered 2015-08-04: 10 mg via INTRAVENOUS
  Filled 2015-08-03: qty 2

## 2015-08-03 NOTE — Progress Notes (Signed)
Nutrition Brief Note  Patient identified on the Malnutrition Screening Tool (MST) Report  Wt Readings from Last 15 Encounters:  08/03/15 213 lb 6.5 oz (96.8 kg)  07/22/15 209 lb 14.1 oz (95.2 kg)  05/17/15 210 lb (95.255 kg)  05/12/15 210 lb (95.255 kg)  05/05/15 217 lb (98.431 kg)  01/12/15 224 lb 6.4 oz (101.787 kg)  11/02/14 234 lb 3.2 oz (106.232 kg)  09/23/14 238 lb (107.956 kg)  08/26/14 241 lb 9.6 oz (109.589 kg)  07/17/14 237 lb 10.5 oz (107.8 kg)  07/06/14 244 lb (110.678 kg)  07/01/14 214 lb (97.07 kg)  05/24/14 238 lb 5.1 oz (108.1 kg)  12/15/12 255 lb 8 oz (115.894 kg)  12/09/12 264 lb 12.8 oz (120.112 kg)    Body mass index is 28.16 kg/(m^2). Patient meets criteria for overweight based on current BMI. Pt with no significant fat or muscle mass loss.   Current diet order is clear liquids, patient is undergoing bowel prep for colonoscopy tomorrow. Pt reports appetite is fine currently and PTA with no other difficulties. Labs and medications reviewed.   No nutrition interventions warranted at this time. If nutrition issues arise, please consult RD.   Corrin Parker, MS, RD, LDN Pager # [phone] After hours/ weekend pager # [phone]

## 2015-08-03 NOTE — Procedures (Signed)
I have personally attended this patient's dialysis session.   About 5 kg over EDW but soft BP UF as tolerated 2K bath for K 4.4 Hb stable at 7.6 since PRBC's on 6/19 No heparin  Jamal Maes, MD [facility] [phone] Pager 08/03/2015, 8:20 AM

## 2015-08-03 NOTE — Progress Notes (Signed)
Refusing bed alarm  

## 2015-08-03 NOTE — Progress Notes (Signed)
eLink Physician-Brief Progress Note Patient Name: Carl Izzard Sr. DOB: Aug 27, 1960 MRN: [id number]   Date of Service  08/03/2015  HPI/Events of Note  Has headache.  Morphine didn't help.  eICU Interventions  Order prn percocet and IV dilaudid.     Intervention Category Major Interventions: Other:  Khayri Kargbo 08/03/2015, 1:34 AM

## 2015-08-03 NOTE — Progress Notes (Signed)
Daily Rounding Note  08/03/2015, 10:14 AM  LOS: 2 days   SUBJECTIVE:       Stools blackish red, no hematochezia.  No abd pain.  Seen during HD session, no complaints  OBJECTIVE:         Vital signs in last 24 hours:    Temp:  [98 F (36.7 C)-98.7 F (37.1 C)] 98 F (36.7 C) (06/21 0804) Pulse Rate:  [60-126] 85 (06/21 0930) Resp:  [11-19] 11 (06/21 0930) BP: (102-136)/(66-85) 125/85 mmHg (06/21 0930) SpO2:  [93 %-100 %] 100 % (06/21 0804) Weight:  [100.699 kg (222 lb)-101.8 kg (224 lb 6.9 oz)] 101.8 kg (224 lb 6.9 oz) (06/21 0804) Last BM Date: 08/02/15 Filed Weights   08/02/15 0400 08/02/15 1722 08/03/15 0804  Weight: 101 kg (222 lb 10.6 oz) 100.699 kg (222 lb) 101.8 kg (224 lb 6.9 oz)   General: pleasant.  Looks well.    Heart: RRR Chest: clear bil.   Abdomen: soft, NT, ND.  +BS.    Extremities: no CCE. Neuro/Psych:  Pleasant, calm, fully alert and oriented.  No gross deficits.   Intake/Output from previous day: 06/20 0701 - 06/21 0700 In: 100 [IV Piggyback:100] Out: -   Intake/Output this shift:    Lab Results:  Recent Labs  08/01/15 2348 08/02/15 0730 08/03/15 0508  WBC 9.1 7.9  7.8 8.6  HGB 7.3* 7.6*  7.6* 7.6*  HCT 22.8* 23.4*  23.3* 23.9*  PLT 211 218  209 242   BMET  Recent Labs  08/01/15 0610 08/01/15 0627 08/02/15 0730 08/03/15 0508  NA 137 138 135 137  K 4.1 4.0 3.6 4.4  CL 102 99* 98* 97*  CO2 22  --  28 27  GLUCOSE 126* 123* 82 80  BUN 57* 54* 17 23*  CREATININE 12.03* 12.60* 6.04* 8.42*  CALCIUM 7.9*  --  7.6* 8.6*   LFT  Recent Labs  08/01/15 0610  PROT 6.0*  ALBUMIN 2.2*  AST 14*  ALT 11*  ALKPHOS 61  BILITOT 0.2*   PT/INR  Recent Labs  08/01/15 0610  LABPROT 14.8  INR 1.14   Hepatitis Panel No results for input(s): HEPBSAG, HCVAB, HEPAIGM, HEPBIGM in the last 72 hours.  Studies/Results: No results found.  Scheduled Meds: . antiseptic oral  rinse  7 mL Mouth Rinse BID  . bisacodyl  10 mg Oral Once  . cinacalcet  90 mg Oral BID WC  . darbepoetin (ARANESP) injection - DIALYSIS  200 mcg Intravenous Q Wed-HD  . doxercalciferol  2 mcg Intravenous Q M,W,F-HD  . folic acid  1 mg Intravenous Daily  . gabapentin  100 mg Oral BID  . metoCLOPramide (REGLAN) injection  10 mg Intravenous Once   Followed by  . [START ON 08/04/2015] metoCLOPramide (REGLAN) injection  10 mg Intravenous Once  . pantoprazole (PROTONIX) IV  40 mg Intravenous Q12H  . peg 3350 powder  1 kit Oral Once  . piperacillin-tazobactam (ZOSYN)  IV  2.25 g Intravenous Q8H   Continuous Infusions:  PRN Meds:.sodium chloride, HYDROmorphone (DILAUDID) injection, morphine injection, oxyCODONE-acetaminophen  ASSESMENT:   *  Painless hematochezia. Suspect diverticular bleed. Lack of abdominal pain intensification weighs against ischemic colitis (and would be unusual to see in cecum). Suppose this could be IBD but doubt this.   * Cecal diverticulitis, improved on 2 week course cipro/flagyl. Though clinically pt has improved,the CT reveals little change in cecal diverticulitis.Day 2 Zosyn.     *  ABL anemia. S/p PRBC x 2.  On weekly Aranesp and Feraheme.   * ESRD on HD MWF.     PLAN   *  Colonoscopy 6/22 at 9AM.  Bowel prep orders placed. Stop Protonix.    Azucena Freed  08/03/2015, 10:14 AM Pager: [phone]

## 2015-08-03 NOTE — Care Management Important Message (Signed)
Important Message  Patient Details  Name: Carl Wist Sr. MRN: [id number] Date of Birth: [birth year]   Medicare Important Message Given:  Yes    Loann Quill 08/03/2015, 1:56 PM

## 2015-08-03 NOTE — Progress Notes (Signed)
[facility] Progress Note  Patient: Carl Yoak Sr. WSF:[id number]   PCP: Lucrezia Starch, MD DOB: 1960-11-19   DOA: 08/01/2015   DOS: 08/03/2015   Date of Service: the patient was seen and examined on 08/03/2015  Subjective: Patient denies any acute complaint. No nausea no vomiting. No abdominal pain. No active bleeding reported. Nutrition: Tolerating diet  Brief hospital course: Pt. with PMH of ESRD on HD, OSA, HTN; admitted on 08/01/2015, with complaint of right lower quadrant pain and bleeding with passing out episode, was found to have BRBPR. CT scan was concerning for cecal thickening and colonoscopy was recommended try GI scheduled on Thursday. Currently further plan is continue monitoring course after colonoscopy.  Assessment and Plan: 1. GI bleed Probable diverticular bleed CT abdomen suggested cecal wall thickening concerning for diverticulitis. Scheduled for colonoscopy on Thursday. Currently her bleeding resolved. Continue IV Zosyn.  2. ESRD on hemodialysis. Currently euvolemic. Appreciate nephrology consultation. Continuing Sensipar  3. Syncope. Likely from acute blood loss. No focal deficit no events on telemetry. We'll continue to closely monitor. Patient is ambulating without any assistance at present.  4. Acute blood loss anemia. Status post DDAVP 1, Feraheme 1. Refusing blood transfusion  Pain management: When necessary Tylenol Activity: Currently independent  Bowel regimen: last BM 08/02/2015 Diet: Clear liquid diet, nothing by mouth after midnight DVT Prophylaxis: mechanical compression device  Advance goals of care discussion: Full code  Family Communication: NO family was present at bedside, at the time of interview.   Disposition:  Discharge to likely home. Expected discharge date: 08/05/2015, stabilization of hemoglobin  Consultants: Gastroenterology, CCM primary admission him on nephrology Procedures: Colonoscopy,  hemodialysis  Antibiotics: Anti-infectives    Start     Dose/Rate Route Frequency Ordered Stop   08/02/15 1600  piperacillin-tazobactam (ZOSYN) IVPB 2.25 g     2.25 g 100 mL/hr over 30 Minutes Intravenous Every 8 hours 08/02/15 1338     08/01/15 1600  piperacillin-tazobactam (ZOSYN) IVPB 2.25 g  Status:  Discontinued     2.25 g 100 mL/hr over 30 Minutes Intravenous Every 8 hours 08/01/15 0805 08/02/15 1338   08/01/15 0745  piperacillin-tazobactam (ZOSYN) IVPB 3.375 g     3.375 g 100 mL/hr over 30 Minutes Intravenous  Once 08/01/15 0736 08/01/15 0836        Intake/Output Summary (Last 24 hours) at 08/03/15 1953 Last data filed at 08/03/15 1913  Gross per 24 hour  Intake    240 ml  Output   4800 ml  Net  -4560 ml   Filed Weights   08/02/15 1722 08/03/15 0804 08/03/15 1224  Weight: 100.699 kg (222 lb) 101.8 kg (224 lb 6.9 oz) 96.8 kg (213 lb 6.5 oz)    Objective: Physical Exam: Filed Vitals:   08/03/15 1130 08/03/15 1200 08/03/15 1224 08/03/15 1537  BP: 122/84 124/84 122/80 106/71  Pulse: 90 92 90 103  Temp:   98.2 F (36.8 C) 98.3 F (36.8 C)  TempSrc:   Oral Oral  Resp: _0 Weight:   96.8 kg (213 lb 6.5 oz)   SpO2:   98% 100%    General: Alert, Awake and Oriented to Time, Place and Person. Appear in NO distress Eyes: PERRL, Conjunctiva normal ENT: Oral Mucosa clear moist. Neck: no JVD, no Abnormal Mass Or lumps Cardiovascular: S1 and S2 Present, aortic systolic Murmur, Respiratory: Bilateral Air entry equal and Decreased, Clear to Auscultation, no Crackles, no wheezes Abdomen: Bowel Sound present, Soft and no tenderness Skin:  no redness, no Rash  Extremities: no Pedal edema, no calf tenderness Neurologic: Grossly no focal neuro deficit. Bilaterally Equal motor strength  Data Reviewed: CBC:  Recent Labs Lab 08/01/15 0610  08/01/15 1322 08/01/15 1823 08/01/15 2348 08/02/15 0730 08/03/15 0508  WBC 15.3*  < > 15.2* 10.9* 9.1 7.9  7.8 8.6   NEUTROABS 12.2*  --   --   --   --   --   --   HGB 7.6*  < > 6.2* 6.6* 7.3* 7.6*  7.6* 7.6*  HCT 24.6*  < > 19.9* 20.7* 22.8* 23.4*  23.3* 23.9*  MCV 89.5  < > 88.8 89.6 88.0 88.6  88.3 88.2  PLT 344  < > 266 259 211 218  209 242  < > = values in this interval not displayed. Basic Metabolic Panel:  Recent Labs Lab 08/01/15 0610 08/01/15 0627 08/02/15 0730 08/03/15 0508  NA 137 138 135 137  K 4.1 4.0 3.6 4.4  CL 102 99* 98* 97*  CO2 22  --  28 27  GLUCOSE 126* 123* 82 80  BUN 57* 54* 17 23*  CREATININE 12.03* 12.60* 6.04* 8.42*  CALCIUM 7.9*  --  7.6* 8.6*  MG  --   --  1.7  --     Liver Function Tests:  Recent Labs Lab 08/01/15 0610  AST 14*  ALT 11*  ALKPHOS 61  BILITOT 0.2*  PROT 6.0*  ALBUMIN 2.2*    Recent Labs Lab 08/01/15 0610  LIPASE 124*   No results for input(s): AMMONIA in the last 168 hours. Coagulation Profile:  Recent Labs Lab 08/01/15 0610  INR 1.14   Cardiac Enzymes:  Recent Labs Lab 08/01/15 0610  TROPONINI 0.03   BNP (last 3 results) No results for input(s): PROBNP in the last 8760 hours.  CBG:  Recent Labs Lab 08/01/15 1334 08/02/15 0840 08/02/15 1129  GLUCAP 90 81 78    Studies: No results found.   Scheduled Meds: . antiseptic oral rinse  7 mL Mouth Rinse BID  . cinacalcet  90 mg Oral BID WC  . darbepoetin (ARANESP) injection - DIALYSIS  200 mcg Intravenous Q Wed-HD  . doxercalciferol  2 mcg Intravenous Q M,W,F-HD  . folic acid  1 mg Intravenous Daily  . gabapentin  100 mg Oral BID  . [START ON 08/04/2015] metoCLOPramide (REGLAN) injection  10 mg Intravenous Once  . [START ON 08/04/2015] peg 3350 powder  0.5 kit Oral Once  . piperacillin-tazobactam (ZOSYN)  IV  2.25 g Intravenous Q8H   Continuous Infusions:  PRN Meds: sodium chloride, HYDROmorphone (DILAUDID) injection, morphine injection, oxyCODONE-acetaminophen  Time spent: 30 minutes  Author: Berle Mull, MD [facility] Pager:  [phone] 08/03/2015 7:53 PM  If 7PM-7AM, please contact night-coverage at [email or web address], password [facility]

## 2015-08-03 NOTE — Progress Notes (Signed)
[facility] Rounding Note Subjective/Interval Events  Seen in HD Transfused 2 units PRBC's with last HD 6/19 Hb 7.6 this AM and stable 1 stool past 24 hours "just a little red" Says "I need my gabapentin" and "I have not been getting my sensipar" (I am aware) C/o headache    Objective Vital signs in last 24 hours: Filed Vitals:   08/02/15 1600 08/02/15 1722 08/02/15 1959 08/03/15 0550  BP: 111/80 136/77 134/75 122/78  Pulse: 80 85 77 71  Temp:  98.2 F (36.8 C) 98.2 F (36.8 C) 98 F (36.7 C)  TempSrc:  Oral Oral Oral  Resp: 15 16 16 16   Weight:  100.699 kg (222 lb)    SpO2: 100% 100% 100% 100%   Weight change: 0.198 kg (7 oz)  Physical Exam: Pleasant AAM, NAD Seen in HD BP 122/78 mmHg  Pulse 71  Temp(Src) 98 F (36.7 C) (Oral)  Resp 16  Wt 100.699 kg (222 lb)  SpO2 100% Lungs clear Regular rhythm. Normal S1S2 No S3 Abd + BS, not hyperactive, no focal tenderness Aneurysmal R AVF + bruit No LE edema.    Labs: Basic Metabolic Panel:  Recent Labs Lab 08/01/15 0610 08/01/15 0627 08/02/15 0730 08/03/15 0508  NA 137 138 135 137  K 4.1 4.0 3.6 4.4  CL 102 99* 98* 97*  CO2 22  --  28 27  GLUCOSE 126* 123* 82 80  BUN 57* 54* 17 23*  CREATININE 12.03* 12.60* 6.04* 8.42*  CALCIUM 7.9*  --  7.6* 8.6*     Recent Labs Lab 08/01/15 0610  AST 14*  ALT 11*  ALKPHOS 61  BILITOT 0.2*  PROT 6.0*  ALBUMIN 2.2*    Recent Labs Lab 08/01/15 0610  LIPASE 124*     Recent Labs Lab 08/01/15 0610  08/01/15 1322 08/01/15 1823 08/01/15 2348 08/02/15 0730 08/03/15 0508  WBC 15.3*  < > 15.2* 10.9* 9.1 7.9  7.8 8.6  NEUTROABS 12.2*  --   --   --   --   --   --   HGB 7.6*  < > 6.2* 6.6* 7.3* 7.6*  7.6* 7.6*  HCT 24.6*  < > 19.9* 20.7* 22.8* 23.4*  23.3* 23.9*  MCV 89.5  < > 88.8 89.6 88.0 88.6  88.3 88.2  PLT 344  < > 266 259 211 218  209 242  < > = values in this interval not displayed.   Recent Labs Lab 08/01/15 0610  TROPONINI 0.03     Medications:   . antiseptic oral rinse  7 mL Mouth Rinse BID  . darbepoetin (ARANESP) injection - DIALYSIS  200 mcg Intravenous Q Wed-HD  . doxercalciferol  2 mcg Intravenous Q M,W,F-HD  . folic acid  1 mg Intravenous Daily  . pantoprazole (PROTONIX) IV  40 mg Intravenous Q12H  . piperacillin-tazobactam (ZOSYN)  IV  2.25 g Intravenous Q8H   Ct Abdomen Pelvis W Contrast  08/01/2015  CLINICAL DATA:  55 year old male with a history of right-sided abdominal pain for 3 weeks EXAM: CT ABDOMEN AND PELVIS WITH CONTRAST TECHNIQUE: Multidetector CT imaging of the abdomen and pelvis was performed using the standard protocol following bolus administration of intravenous contrast. CONTRAST:  1 ISOVUE-300 IOPAMIDOL (ISOVUE-300) INJECTION 61% COMPARISON:  CT 07/21/2015, 09/03/2013 FINDINGS: Lower chest: Unremarkable appearance of the soft tissues of the chest wall. Heart size within normal limits.  No pericardial fluid/thickening. Calcifications of the coronary arteries in the distribution of the left main, circumflex, right coronary arteries.  No lower mediastinal adenopathy. Unremarkable appearance of the distal esophagus. No hiatal hernia. No confluent airspace disease, pleural fluid, or pneumothorax within visualized lung. Small pulmonary nodule associated with the minor fissure on the right measures 3 mm. Abdomen/pelvis: Unremarkable appearance of liver, spleen. Unremarkable appearance of bilateral adrenal glands. Unremarkable appearance of the pancreas with no cystic changes. Unremarkable gallbladder. Stigmata of polycystic kidney disease. No right or left-sided hydronephrosis. Punctate calcifications within the left kidney may represent nonobstructive stones versus calcifications. Cecal wall thickening with associated inflammatory changes of the adjacent mesenteric and trace amount of fluid within the fracture planes. No abscess. No free intraperitoneal air. Background of colonic diverticula with inspissated  secretions. Normal appendix. No significant inflammatory changes identified within the distal ileum. Overall the inflammatory changes are similar in appearance to the CT that was performed 07/21/2015. No abnormally distended small bowel or colon. Colonic diverticula extend through the length of the colon. Unremarkable course caliber and contour of the abdominal aorta and iliac vessels. Minimal atherosclerotic changes. No free air.  No pelvic free fluid. Unremarkable appearance of the urinary bladder No displaced fracture. Sclerotic appearance of this musculoskeletal elements, compatible with renal osteodystrophy. IMPRESSION: Similar appearance of inflammation involving the cecum, which is either unchanged or recurrent from the CT performed 07/21/2015. Favored diagnosis is cecal diverticulitis in the absence of a known history of inflammatory bowel disease. Future correlation with colonoscopy is recommended, as an inflammatory tumor cannot be excluded. Stigmata of polycystic kidney disease with associated renal osteodystrophy. Coronary artery disease. There is a 3 mm nodule associated with the minor fissure. If the patient is at high risk for bronchogenic carcinoma, follow-up chest CT at 1 year is recommended. If the patient is at low risk, no follow-up is needed. This recommendation follows the consensus statement: Guidelines for Management of Small Pulmonary Nodules Detected on CT Scans: A Statement from the [employer] as published in Radiology 2005; 237:395-400. These results were called by telephone at the time of interpretation on 08/01/2015 at 7:28 am to Dr. Venora Maples, who verbally acknowledged these results. Signed, Dulcy Fanny. Earleen Newport, DO [facility] Electronically Signed   By: Corrie Mckusick D.O.   On: 08/01/2015 07:34   Dg Chest Port 1 View  08/01/2015  CLINICAL DATA:  Shortness of breath, syncope EXAM: PORTABLE CHEST 1 VIEW COMPARISON:   02/08/2009.  Chest CT 07/22/2015 FINDINGS: Heart and mediastinal contours are within normal limits. No focal opacities or effusions. No acute bony abnormality. IMPRESSION: No active disease. Electronically Signed   By: Rolm Baptise M.D.   On: 08/01/2015 07:12   Dialysis Orders:  [facility]: [facility]  4.5 hours  180  500 A 1.5  EDW 96.5  2K 2 Ca  right lower AVF  Hectorol 2  heparin 5000 will now be discontinued indefinitely Mircera 50 q 2 last  On 07/27/15  Recent labs: hgb 10.0  On 6/14  21% sat ferritin 1476   iPTH 1242   P 5.6 Ca 8.6   Background 55 yo AAM with known diverticulosis, recent admission for cecal diverticulitis (6/8-6/10) readmitted with LGIB presumably diverticular in origin.    Problem/Plan: 1. LGIB - Diverticular.Transfused 2 units this admission with stable HB 7.6 since transfusion .Plans noted for colonoscopy on Thursday. 2. S/p recent cecal diverticulitis - CT this admission still shows inflammatory changes at level of cecum. 3. ESRD - HD MWF - no heparin indefinitely. 5.3 kg above EDW but BP soft - UF as tolerated. Avoid  hypotension. 4. Anemia - ESRD + ABLA - Hb 7.6 this AM. Last Mircera 6/14. Dose Aranesp 200 today.  5. Secondary hyperparathyroidism - PTH >1,000 . Hectorol with HD + sensipar 180 - resume. Restart binders when on solids. 6. HTN/volume - normotensive no meds.  7. 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 Peripheral neuropathy - resumed gabapentin  Jamal Maes, MD [facility] [phone] Pager 08/03/2015, 8:06 AM

## 2015-08-04 ENCOUNTER — Encounter (HOSPITAL_COMMUNITY): Payer: Self-pay | Admitting: Internal Medicine

## 2015-08-04 ENCOUNTER — Encounter (HOSPITAL_COMMUNITY)
Admission: EM | Disposition: A | Discharge status: Home / Self Care | Payer: Self-pay | Source: Home / Self Care | Attending: Internal Medicine

## 2015-08-04 DIAGNOSIS — K219 Gastro-esophageal reflux disease without esophagitis: Secondary | ICD-10-CM

## 2015-08-04 DIAGNOSIS — K922 Gastrointestinal hemorrhage, unspecified: Secondary | ICD-10-CM

## 2015-08-04 DIAGNOSIS — D374 Neoplasm of uncertain behavior of colon: Secondary | ICD-10-CM

## 2015-08-04 HISTORY — PX: COLONOSCOPY: SHX5424

## 2015-08-04 LAB — RENAL FUNCTION PANEL
Albumin: 2.2 g/dL — ABNORMAL LOW (ref 3.5–5.0)
Anion gap: 13 (ref 5–15)
BUN: 11 mg/dL (ref 6–20)
CO2: 26 mmol/L (ref 22–32)
Calcium: 7.9 mg/dL — ABNORMAL LOW (ref 8.9–10.3)
Chloride: 99 mmol/L — ABNORMAL LOW (ref 101–111)
Creatinine, Ser: 6.37 mg/dL — ABNORMAL HIGH (ref 0.61–1.24)
GFR calc Af Amer: 10 mL/min — ABNORMAL LOW (ref >60)
GFR calc non Af Amer: 9 mL/min — ABNORMAL LOW (ref >60)
Glucose, Bld: 87 mg/dL (ref 65–99)
Phosphorus: 4.7 mg/dL — ABNORMAL HIGH (ref 2.5–4.6)
Potassium: 3.6 mmol/L (ref 3.5–5.1)
Sodium: 138 mmol/L (ref 135–145)

## 2015-08-04 LAB — CBC
HCT: 25 % — ABNORMAL LOW (ref 39.0–52.0)
Hemoglobin: 8 g/dL — ABNORMAL LOW (ref 13.0–17.0)
MCH: 29 pg (ref 26.0–34.0)
MCHC: 32 g/dL (ref 30.0–36.0)
MCV: 90.6 fL (ref 78.0–100.0)
Platelets: 270 10*3/uL (ref 150–400)
RBC: 2.76 MIL/uL — ABNORMAL LOW (ref 4.22–5.81)
RDW: 16.5 % — ABNORMAL HIGH (ref 11.5–15.5)
WBC: 8.4 10*3/uL (ref 4.0–10.5)

## 2015-08-04 LAB — PROTIME-INR
INR: 1.18 (ref 0.00–1.49)
Prothrombin Time: 15.2 seconds (ref 11.6–15.2)

## 2015-08-04 LAB — MAGNESIUM: Magnesium: 2.1 mg/dL (ref 1.7–2.4)

## 2015-08-04 SURGERY — COLONOSCOPY
Anesthesia: Moderate Sedation

## 2015-08-04 MED ORDER — SPOT INK MARKER SYRINGE KIT
PACK | SUBMUCOSAL | Status: AC
  Filled 2015-08-04: qty 5

## 2015-08-04 MED ORDER — ALVIMOPAN 12 MG PO CAPS
12.0000 mg | ORAL_CAPSULE | Freq: Once | ORAL | Status: AC
  Administered 2015-08-04: 12 mg via ORAL
  Filled 2015-08-04: qty 1

## 2015-08-04 MED ORDER — CHLORHEXIDINE GLUCONATE CLOTH 2 % EX PADS
6.0000 | MEDICATED_PAD | Freq: Once | CUTANEOUS | Status: AC
  Administered 2015-08-05: 6 via TOPICAL

## 2015-08-04 MED ORDER — FENTANYL CITRATE (PF) 100 MCG/2ML IJ SOLN
INTRAMUSCULAR | Status: AC
  Filled 2015-08-04: qty 2

## 2015-08-04 MED ORDER — METRONIDAZOLE 500 MG PO TABS
1000.0000 mg | ORAL_TABLET | ORAL | Status: DC
  Administered 2015-08-04: 1000 mg via ORAL
  Filled 2015-08-04: qty 2

## 2015-08-04 MED ORDER — CHLORHEXIDINE GLUCONATE CLOTH 2 % EX PADS
6.0000 | MEDICATED_PAD | Freq: Once | CUTANEOUS | Status: AC
  Administered 2015-08-04: 6 via TOPICAL

## 2015-08-04 MED ORDER — NEOMYCIN SULFATE 500 MG PO TABS
1000.0000 mg | ORAL_TABLET | ORAL | Status: DC
  Administered 2015-08-04: 1000 mg via ORAL
  Filled 2015-08-04 (×3): qty 2

## 2015-08-04 MED ORDER — MIDAZOLAM HCL 5 MG/5ML IJ SOLN
INTRAMUSCULAR | Status: DC | PRN
Start: 2015-08-04 — End: 2015-08-04
  Administered 2015-08-04 (×5): 2 mg via INTRAVENOUS

## 2015-08-04 MED ORDER — MIDAZOLAM HCL 5 MG/ML IJ SOLN
INTRAMUSCULAR | Status: AC
  Filled 2015-08-04: qty 2

## 2015-08-04 MED ORDER — FENTANYL CITRATE (PF) 100 MCG/2ML IJ SOLN
INTRAMUSCULAR | Status: DC | PRN
  Administered 2015-08-04 (×4): 25 ug via INTRAVENOUS

## 2015-08-04 MED ORDER — SPOT INK MARKER SYRINGE KIT
PACK | SUBMUCOSAL | Status: DC | PRN
  Administered 2015-08-04: 3.5 mL via SUBMUCOSAL

## 2015-08-04 NOTE — Progress Notes (Signed)
[facility] Progress Note  Subjective:  "I'm feeling all right." Sleeping post colonscopy-states they haven't told him the results yet.  No C/O. No bloody stools reported.   Objective Filed Vitals:   08/04/15 1009 08/04/15 1010 08/04/15 1020 08/04/15 1030  BP: 107/74 107/74 111/75 118/74  Pulse: 92 91 88 87  Temp:      TempSrc:      Resp: 17 15 13 12   Weight:      SpO2: 93% 100% 96% 100%   Physical Exam General: Pleasant, slightly lethargic post conscious sedation, NAD Heart: S1, S2, Gr2/6 M Lungs: BBS CTA A/P Abdomen: soft non-tender, active BS  Extremities: No LE edema.  Dialysis Access: LFA AVF + bruit multiple aneurysmal dilitaions.    Additional Objective Labs: Basic Metabolic Panel:  Recent Labs Lab 08/02/15 0730 08/03/15 0508 08/04/15 0521  NA 135 137 138  K 3.6 4.4 3.6  CL 98* 97* 99*  CO2 28 27 26   GLUCOSE 82 80 87  BUN 17 23* 11  CREATININE 6.04* 8.42* 6.37*  CALCIUM 7.6* 8.6* 7.9*  PHOS  --   --  4.7*   Liver Function Tests:  Recent Labs Lab 08/01/15 0610 08/04/15 0521  AST 14*  --   ALT 11*  --   ALKPHOS 61  --   BILITOT 0.2*  --   PROT 6.0*  --   ALBUMIN 2.2* 2.2*    Recent Labs Lab 08/01/15 0610  LIPASE 124*   CBC:  Recent Labs Lab 08/01/15 0610  08/01/15 1823 08/01/15 2348 08/02/15 0730 08/03/15 0508 08/04/15 0521  WBC 15.3*  < > 10.9* 9.1 7.9  7.8 8.6 8.4  NEUTROABS 12.2*  --   --   --   --   --   --   HGB 7.6*  < > 6.6* 7.3* 7.6*  7.6* 7.6* 8.0*  HCT 24.6*  < > 20.7* 22.8* 23.4*  23.3* 23.9* 25.0*  MCV 89.5  < > 89.6 88.0 88.6  88.3 88.2 90.6  PLT 344  < > 259 211 218  209 242 270  < > = values in this interval not displayed. Blood Culture    Component Value Date/Time   SDES BLOOD LEFT HAND 08/01/2015 0624   SPECREQUEST BOTTLES DRAWN AEROBIC AND ANAEROBIC  5CC 08/01/2015 0624   CULT NO GROWTH 2 DAYS 08/01/2015 0624   REPTSTATUS PENDING 08/01/2015 0624    Cardiac Enzymes:  Recent  Labs Lab 08/01/15 0610  TROPONINI 0.03   CBG:  Recent Labs Lab 08/01/15 1334 08/02/15 0840 08/02/15 1129  GLUCAP 90 81 78   Iron Studies: No results for input(s): IRON, TIBC, TRANSFERRIN, FERRITIN in the last 72 hours. @lablastinr3 @ Studies/Results: No results found. Medications:   . antiseptic oral rinse  7 mL Mouth Rinse BID  . cinacalcet  90 mg Oral BID WC  . darbepoetin (ARANESP) injection - DIALYSIS  200 mcg Intravenous Q Wed-HD  . doxercalciferol  2 mcg Intravenous Q M,W,F-HD  . folic acid  1 mg Intravenous Daily  . gabapentin  100 mg Oral BID  . piperacillin-tazobactam (ZOSYN)  IV  2.25 g Intravenous Q8H   Dialysis Orders:  [facility]: [facility]  4.5 hours  180  500 A 1.5  EDW 96.5  2K 2 Ca  right lower AVF  Hectorol 2  heparin 5000 will now be discontinued indefinitely Mircera 50 q 2 last On 07/27/15  Recent labs: hgb 10.0 On 6/14 21% sat ferritin 1476 iPTH 1242 P 5.6  Ca 8.6   Background 55 yo AAM with known diverticulosis, recent admission for cecal diverticulitis (6/8-6/10) readmitted with LGIB presumably diverticular in origin.   Problem/Plan:   1.     LGIB - Diverticular.Transfused 2 units this admission with stable HB 7.6 since transfusion .Colonscopy today. Ulcerated partially obstructing large mass was found in the proximal     ascending colon, near the ileocecal valve. No bleeding noted. Biopsy done for histology. Multiple small and large diverticula ascending colon-no bleeding found. Non bleeding small internal and external hemorrhoids. GI consulting general surgery today.  1. S/p recent cecal diverticulitis - CT this admission still shows inflammatory changes at level of cecum. 2. ESRD - HD MWF - no heparin indefinitely. HD 08/03/15. Next tx tomorrow.  3. Anemia - ESRD + ABLA - Hb 7.6 yesterday 8.0 today. Last Mircera 6/14. Rec'd Aranesp 08/03/15. Follow HGB.   4. Secondary hyperparathyroidism - PTH >1,000 . Hectorol with HD +  sensipar 180 - resume. Restart binders when on solids. 5. HTN/volume - normotensive no meds. HD 08/03/15 Pre wt 101.8 kg Net UF 4800 Post wt 96.8 kg. Almost to OP EDW. HD tomorrow on schedule UFG 0.5-1 liter.  6. 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 Peripheral neuropathy - resumed gabapentin   Rita H. Brown NP-C 08/04/2015, 1:10 PM  [facility] [phone] I have seen and examined this patient and agree with the plan of care seen , eval, examined. .  Emaad Nanna L 08/04/2015, 2:57 PM

## 2015-08-04 NOTE — Progress Notes (Signed)
[facility] Progress Note  Patient: Carl Rorrer Sr. [id number]   PCP: Lucrezia Starch, MD DOB: 1960-03-12   DOA: 08/01/2015   DOS: 08/04/2015   Date of Service: the patient was seen and examined on 08/04/2015  Subjective: Patient tolerated colonoscopy well. Denies any acute complaint. No nausea no vomiting. No abdominal pain. No diarrhea no constipation. No active bleeding. Nutrition: Tolerating diet  Brief hospital course: Pt. with PMH of ESRD on HD, OSA, HTN; admitted on 08/01/2015, with complaint of right lower quadrant pain and bleeding with passing out episode, was found to have BRBPR. CT scan was concerning for cecal thickening and colonoscopy was recommended try GI scheduled on Thursday. Currently further plan is continue monitoring course after colonoscopy.  Assessment and Plan: 1. GI bleed Large ulcer causing obstruction at the ileocecal wall concerning for carcinoma  CT abdomen suggested cecal wall thickening concerning for diverticulitis. Colonoscopies revealing an ulcer concerning for carcinoma. Biopsies are taken. General surgery is consulted with scheduling the patient for colectomy on 08/05/2015.  Currently bleeding resolved. Continue IV Zosyn.  2. ESRD on hemodialysis. Currently euvolemic. Appreciate nephrology consultation. Continuing [facility] now scheduled on Saturday after the surgery.  3. Syncope. Likely from acute blood loss. No focal deficit no events on telemetry. We'll continue to closely monitor. Patient is ambulating without any assistance at present.  4. Acute blood loss anemia. Status post DDAVP 1, Feraheme 1. Refusing blood transfusion  Pain management: When necessary Tylenol Activity: Currently independent  Bowel regimen: last BM 08/02/2015 Diet: Clear liquid diet, nothing by mouth after midnight DVT Prophylaxis: mechanical compression device  Advance goals of care discussion: Full code  Family Communication: NO family  was present at bedside, at the time of interview.   Disposition:  Discharge to likely home. Expected discharge date: 08/05/2015, stabilization of hemoglobin and postoperative recovery  Consultants: Gastroenterology, CCM primary admission, nephrology, general surgery Procedures: Colonoscopy, hemodialysis  Antibiotics: Anti-infectives    Start     Dose/Rate Route Frequency Ordered Stop   08/04/15 2200  neomycin (MYCIFRADIN) tablet 1,000 mg     1,000 mg Oral 3 times per day 08/04/15 1802 08/05/15 2159   08/04/15 2200  metroNIDAZOLE (FLAGYL) tablet 1,000 mg     1,000 mg Oral 3 times per day 08/04/15 1802 08/05/15 2159   08/02/15 1600  piperacillin-tazobactam (ZOSYN) IVPB 2.25 g     2.25 g 100 mL/hr over 30 Minutes Intravenous Every 8 hours 08/02/15 1338     08/01/15 1600  piperacillin-tazobactam (ZOSYN) IVPB 2.25 g  Status:  Discontinued     2.25 g 100 mL/hr over 30 Minutes Intravenous Every 8 hours 08/01/15 0805 08/02/15 1338   08/01/15 0745  piperacillin-tazobactam (ZOSYN) IVPB 3.375 g     3.375 g 100 mL/hr over 30 Minutes Intravenous  Once 08/01/15 0736 08/01/15 0836        Intake/Output Summary (Last 24 hours) at 08/04/15 1820 Last data filed at 08/04/15 1648  Gross per 24 hour  Intake    120 ml  Output      0 ml  Net    120 ml   Filed Weights   08/03/15 0804 08/03/15 1224 08/03/15 2038  Weight: 101.8 kg (224 lb 6.9 oz) 96.8 kg (213 lb 6.5 oz) 98.204 kg (216 lb 8 oz)    Objective: Physical Exam: Filed Vitals:   08/04/15 1010 08/04/15 1020 08/04/15 1030 08/04/15 1647  BP: 107/74 111/75 118/74 130/79  Pulse: 91 88 87 85  Temp:    98.8  F (37.1 C)  TempSrc:    Oral  Resp: 15 13 12 19   Weight:      SpO2: 100% 96% 100% 100%    General: Alert, Awake and Oriented to Time, Place and Person. Appear in NO distress Eyes: PERRL, Conjunctiva normal ENT: Oral Mucosa clear moist. Neck: no JVD, no Abnormal Mass Or lumps Cardiovascular: S1 and S2 Present, aortic systolic  Murmur, Respiratory: Bilateral Air entry equal and Decreased, Clear to Auscultation, no Crackles, no wheezes Abdomen: Bowel Sound present, Soft and no tenderness Skin: no redness, no Rash  Extremities: no Pedal edema, no calf tenderness Neurologic: Grossly no focal neuro deficit. Bilaterally Equal motor strength  Data Reviewed: CBC:  Recent Labs Lab 08/01/15 0610  08/01/15 1823 08/01/15 2348 08/02/15 0730 08/03/15 0508 08/04/15 0521  WBC 15.3*  < > 10.9* 9.1 7.9  7.8 8.6 8.4  NEUTROABS 12.2*  --   --   --   --   --   --   HGB 7.6*  < > 6.6* 7.3* 7.6*  7.6* 7.6* 8.0*  HCT 24.6*  < > 20.7* 22.8* 23.4*  23.3* 23.9* 25.0*  MCV 89.5  < > 89.6 88.0 88.6  88.3 88.2 90.6  PLT 344  < > 259 211 218  209 242 270  < > = values in this interval not displayed. Basic Metabolic Panel:  Recent Labs Lab 08/01/15 0610 08/01/15 0627 08/02/15 0730 08/03/15 0508 08/04/15 0521  NA 137 138 135 137 138  K 4.1 4.0 3.6 4.4 3.6  CL 102 99* 98* 97* 99*  CO2 22  --  28 27 26   GLUCOSE 126* 123* 82 80 87  BUN 57* 54* 17 23* 11  CREATININE 12.03* 12.60* 6.04* 8.42* 6.37*  CALCIUM 7.9*  --  7.6* 8.6* 7.9*  MG  --   --  1.7  --  2.1  PHOS  --   --   --   --  4.7*    Liver Function Tests:  Recent Labs Lab 08/01/15 0610 08/04/15 0521  AST 14*  --   ALT 11*  --   ALKPHOS 61  --   BILITOT 0.2*  --   PROT 6.0*  --   ALBUMIN 2.2* 2.2*    Recent Labs Lab 08/01/15 0610  LIPASE 124*   No results for input(s): AMMONIA in the last 168 hours. Coagulation Profile:  Recent Labs Lab 08/01/15 0610 08/04/15 1603  INR 1.14 1.18   Cardiac Enzymes:  Recent Labs Lab 08/01/15 0610  TROPONINI 0.03   BNP (last 3 results) No results for input(s): PROBNP in the last 8760 hours.  CBG:  Recent Labs Lab 08/01/15 1334 08/02/15 0840 08/02/15 1129  GLUCAP 90 81 78    Studies: No results found.   Scheduled Meds: . alvimopan  12 mg Oral Once  . antiseptic oral rinse  7 mL Mouth  Rinse BID  . Chlorhexidine Gluconate Cloth  6 each Topical Once   And  . Chlorhexidine Gluconate Cloth  6 each Topical Once  . cinacalcet  90 mg Oral BID WC  . darbepoetin (ARANESP) injection - DIALYSIS  200 mcg Intravenous Q Wed-HD  . doxercalciferol  2 mcg Intravenous Q M,W,F-HD  . folic acid  1 mg Intravenous Daily  . gabapentin  100 mg Oral BID  . neomycin  1,000 mg Oral 3 times per day   And  . metroNIDAZOLE  1,000 mg Oral 3 times per day  . piperacillin-tazobactam (ZOSYN)  IV  2.25 g Intravenous Q8H   Continuous Infusions:  PRN Meds: sodium chloride, HYDROmorphone (DILAUDID) injection, morphine injection, oxyCODONE-acetaminophen  Time spent: 30 minutes  Author: Berle Mull, MD [facility] Pager: [phone] 08/04/2015 6:20 PM  If 7PM-7AM, please contact night-coverage at [email or web address], password [facility]

## 2015-08-04 NOTE — Progress Notes (Signed)
Pharmacy Antibiotic Note  Carl Childree Sr. is a 55 y.o. male admitted on 08/01/2015 with intra-abdominal infection.  Pharmacy has been consulted for Zosyn dosing.  Pt is ESRD and doses remain appropriate. BCx ngtd.   Plan: 1. Continue Zosyn 2.25g IV every 8 hours 2. Pharmacy will sign off as no dose adjustments expected with ESRD   Weight: 216 lb 8 oz (98.204 kg)  Temp (24hrs), Avg:98.5 F (36.9 C), Min:98.2 F (36.8 C), Max:98.8 F (37.1 C)   Recent Labs Lab 08/01/15 0610 08/01/15 0627 08/01/15 0817  08/01/15 1204  08/01/15 1823 08/01/15 2348 08/02/15 0730 08/03/15 0508 08/04/15 0521  WBC 15.3*  --   --   < >  --   < > 10.9* 9.1 7.9  7.8 8.6 8.4  CREATININE 12.03* 12.60*  --   --   --   --   --   --  6.04* 8.42* 6.37*  LATICACIDVEN  --  1.61 1.19  --  1.37  --   --   --   --   --   --   < > = values in this interval not displayed.  Estimated Creatinine Clearance: 16 mL/min (by C-G formula based on Cr of 6.37).    Allergies  Allergen Reactions  . Infed [Iron Dextran] Other (See Comments)    Decreased BP   . Oxycodone Nausea Only  . Vicodin [Hydrocodone-Acetaminophen] Other (See Comments)    Decreased BP  . Diclofenac Other (See Comments)    Unknown  . Whole Blood Other (See Comments)    Unk    Antimicrobials this admission: Zosyn 6/19 >>   Dose adjustments this admission: n/a  Microbiology results: 6/19 BCx: sent  Thank you for allowing pharmacy to be a part of this patient's care.  Alycia Rossetti, PharmD, BCPS Clinical Pharmacist Pager: [phone] 08/04/2015 3:16 PM

## 2015-08-04 NOTE — Progress Notes (Signed)
Called by Dr. Barry Dienes. Plans for colectomy tomorrow around 1100. Will hold HD tomorrow. Dr. Barry Dienes will check renal function post op in PACU. Will have HD on Saturday, 1st shift. No heparin. Talked to patient, explained plans for HD off schedule. Emotional support to patient. "I just want to get this out as soon as possible."  Juanell Fairly, [place] [phone] (pager)  Jamal Maes, MD [place] [phone] Pager 08/05/2015, 7:25 AM    .

## 2015-08-04 NOTE — Care Management Note (Signed)
Case Management Note  Patient Details  Name: Cross Goldbeck Sr. MRN: [id number] Date of Birth: Jul 09, 1960  Subjective/Objective:       CM following for progression and d/c planning.               Action/Plan: 08/04/2015 CM continuing to follow, noted colonoscopy report and findings. Will follow for plan at this point.   Expected Discharge Date:                 Expected Discharge Plan:  [place]  In-House Referral:     Discharge planning Services  CM Consult  Post Acute Care Choice:    Choice offered to:     DME Arranged:    DME Agency:     HH Arranged:    [place] Agency:     Status of Service:  In process, will continue to follow  If discussed at Long Length of Stay Meetings, dates discussed:    Additional Comments:  Adron Bene, RN 08/04/2015, 11:44 AM

## 2015-08-04 NOTE — Care Management Important Message (Signed)
Important Message  Patient Details  Name: Carl Eshbaugh Sr. MRN: [id number] Date of Birth: [birth year]   Medicare Important Message Given:  Yes    Addisson Frate, Rory Percy, RN 08/04/2015, 11:42 AM

## 2015-08-04 NOTE — Op Note (Addendum)
[facility] Patient Name: [name] Procedure Date : 08/04/2015 MRN: [id number] Attending MD: Jerene Bears , MD Date of Birth: 1960/07/21 CSN: [id number] Age: 55 Admit Type: Inpatient Procedure:                Colonoscopy Indications:              Hematochezia, Abnormal CT of the GI tract Providers:                Lajuan Lines. Hilarie Fredrickson, MD, Malka So, RN, William Dalton, Technician Referring MD:             [facility] Medicines:                Fentanyl 100 micrograms IV, Midazolam 10 mg IV Complications:            No immediate complications. Estimated Blood Loss:     Estimated blood loss was minimal. Procedure:                Pre-Anesthesia Assessment:                           - Prior to the procedure, a History and Physical                            was performed, and patient medications and                            allergies were reviewed. The patient's tolerance of                            previous anesthesia was also reviewed. The risks                            and benefits of the procedure and the sedation                            options and risks were discussed with the patient.                            All questions were answered, and informed consent                            was obtained. Prior Anticoagulants: The patient has                            taken no previous anticoagulant or antiplatelet                            agents. ASA Grade Assessment: III - A patient with                            severe systemic disease. After reviewing the risks  and benefits, the patient was deemed in                            satisfactory condition to undergo the procedure.                           After obtaining informed consent, the colonoscope                            was passed under direct vision. Throughout the                            procedure, the patient's blood  pressure, pulse, and                            oxygen saturations were monitored continuously. The                            EC-[id number]) scope was introduced through                            the anus and advanced to the the cecum, identified                            by the ileocecal valve. The colonoscopy was                            performed without difficulty. The patient tolerated                            the procedure well. The quality of the bowel                            preparation was good. The ileocecal valve and the                            rectum were photographed. Scope In: 9:34:45 AM Scope Out: 9:58:32 AM Scope Withdrawal Time: 0 hours 13 minutes 47 seconds  Total Procedure Duration: 0 hours 23 minutes 47 seconds  Findings:      The perianal exam findings include hemorrhoids.      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 The mass was       circumferential and appeared to fill the cecum. No active bleeding was       present. This was biopsied with a cold forceps for histology. Area       distal to the mass was tattooed with 5 mL of Spot (carbon black) on       opposite walls.      Multiple small and large-mouthed diverticula were found from ascending       colon to sigmoid colon. There was no evidence of diverticular bleeding.      Non-bleeding external and internal hemorrhoids were found during       retroflexion. The hemorrhoids were small.  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 Biopsied. Tattooed.                           - Moderate diverticulosis from ascending colon to                            sigmoid colon. There was no evidence of                            diverticular bleeding.                            - Non-bleeding external and internal hemorrhoids. Moderate Sedation:      Moderate (conscious) sedation was administered by the endoscopy nurse       and supervised by the endoscopist. The following parameters were       monitored: oxygen saturation, heart rate, blood pressure, and response       to care. Total physician intraservice time was 35 minutes. Recommendation:           - Return patient to hospital ward for ongoing care.                           - Clear liquid diet.                           - Await pathology results.                           - Repeat colonoscopy in 1 year for surveillance.                           - Consult with general surgery today.                           - Continue present medications. Procedure Code(s):        --- Professional ---                           [phone], Colonoscopy, flexible; with directed                            submucosal injection(s), any substance                           [id number], Colonoscopy, flexible; with biopsy, single                            or multiple  [id number], Moderate sedation services provided by the                            same physician or other qualified health care                            professional performing the diagnostic or                            therapeutic service that the sedation supports,                            requiring the presence of an independent trained                            observer to assist in the monitoring of the                            patient's level of consciousness and physiological                            status; initial 15 minutes of intraservice time,                            patient age 43 years or older                           [phone], Moderate sedation services; each additional                            15 minutes intraservice time Diagnosis Code(s):        --- Professional ---                           K64.8, Other  hemorrhoids                           D49.0, Neoplasm of unspecified behavior of                            digestive system                           K92.1, Melena (includes Hematochezia)                           K57.30, Diverticulosis of large intestine without                            perforation or abscess without bleeding                           R93.3, Abnormal findings on diagnostic imaging of                            other parts of digestive tract  CPT copyright 2016 [employer]. All rights reserved. The codes documented in this report are preliminary and upon coder review may  be revised to meet current compliance requirements. Jerene Bears, MD 08/04/2015 10:10:25 AM This report has been signed electronically. Number of Addenda: 0

## 2015-08-05 ENCOUNTER — Encounter (HOSPITAL_COMMUNITY)
Admission: EM | Disposition: A | Discharge status: Home / Self Care | Payer: Self-pay | Source: Home / Self Care | Attending: Internal Medicine

## 2015-08-05 ENCOUNTER — Inpatient Hospital Stay (HOSPITAL_COMMUNITY): Payer: Medicare Other | Admitting: Anesthesiology

## 2015-08-05 HISTORY — PX: LAPAROSCOPIC RIGHT COLECTOMY: SHX5925

## 2015-08-05 LAB — CBC
HCT: 26.8 % — ABNORMAL LOW (ref 39.0–52.0)
HCT: 24 % — ABNORMAL LOW (ref 39.0–52.0)
Hemoglobin: 8.5 g/dL — ABNORMAL LOW (ref 13.0–17.0)
Hemoglobin: 7.9 g/dL — ABNORMAL LOW (ref 13.0–17.0)
MCH: 28.1 pg (ref 26.0–34.0)
MCH: 29.4 pg (ref 26.0–34.0)
MCHC: 31.7 g/dL (ref 30.0–36.0)
MCHC: 32.9 g/dL (ref 30.0–36.0)
MCV: 88.7 fL (ref 78.0–100.0)
MCV: 89.2 fL (ref 78.0–100.0)
Platelets: 308 10*3/uL (ref 150–400)
Platelets: 286 10*3/uL (ref 150–400)
RBC: 3.02 MIL/uL — ABNORMAL LOW (ref 4.22–5.81)
RBC: 2.69 MIL/uL — ABNORMAL LOW (ref 4.22–5.81)
RDW: 16.6 % — ABNORMAL HIGH (ref 11.5–15.5)
RDW: 16.6 % — ABNORMAL HIGH (ref 11.5–15.5)
WBC: 8.2 10*3/uL (ref 4.0–10.5)
WBC: 14 10*3/uL — ABNORMAL HIGH (ref 4.0–10.5)

## 2015-08-05 LAB — BASIC METABOLIC PANEL
Anion gap: 14 (ref 5–15)
BUN: 16 mg/dL (ref 6–20)
CO2: 22 mmol/L (ref 22–32)
Calcium: 7.3 mg/dL — ABNORMAL LOW (ref 8.9–10.3)
Chloride: 102 mmol/L (ref 101–111)
Creatinine, Ser: 8.93 mg/dL — ABNORMAL HIGH (ref 0.61–1.24)
GFR calc Af Amer: 7 mL/min — ABNORMAL LOW (ref >60)
GFR calc non Af Amer: 6 mL/min — ABNORMAL LOW (ref >60)
Glucose, Bld: 102 mg/dL — ABNORMAL HIGH (ref 65–99)
Potassium: 3.4 mmol/L — ABNORMAL LOW (ref 3.5–5.1)
Sodium: 138 mmol/L (ref 135–145)

## 2015-08-05 LAB — POCT I-STAT 4, (NA,K, GLUC, HGB,HCT)
Glucose, Bld: 80 mg/dL (ref 65–99)
Glucose, Bld: 88 mg/dL (ref 65–99)
Glucose, Bld: 125 mg/dL — ABNORMAL HIGH (ref 65–99)
HCT: 25 % — ABNORMAL LOW (ref 39.0–52.0)
HCT: 23 % — ABNORMAL LOW (ref 39.0–52.0)
HCT: 22 % — ABNORMAL LOW (ref 39.0–52.0)
Hemoglobin: 8.5 g/dL — ABNORMAL LOW (ref 13.0–17.0)
Hemoglobin: 7.8 g/dL — ABNORMAL LOW (ref 13.0–17.0)
Hemoglobin: 7.5 g/dL — ABNORMAL LOW (ref 13.0–17.0)
Potassium: 3.4 mmol/L — ABNORMAL LOW (ref 3.5–5.1)
Potassium: 3.8 mmol/L (ref 3.5–5.1)
Potassium: 3.7 mmol/L (ref 3.5–5.1)
Sodium: 138 mmol/L (ref 135–145)
Sodium: 137 mmol/L (ref 135–145)
Sodium: 137 mmol/L (ref 135–145)

## 2015-08-05 LAB — PREPARE RBC (CROSSMATCH)

## 2015-08-05 LAB — SURGICAL PCR SCREEN
MRSA, PCR: NEGATIVE
Staphylococcus aureus: NEGATIVE

## 2015-08-05 SURGERY — COLECTOMY, RIGHT, LAPAROSCOPIC
Anesthesia: General | Site: Abdomen

## 2015-08-05 MED ORDER — SODIUM CHLORIDE 0.9% FLUSH: 9.0000 mL | INTRAVENOUS | Status: DC | PRN

## 2015-08-05 MED ORDER — LIDOCAINE HCL 1 % IJ SOLN
INTRAMUSCULAR | Status: DC | PRN
  Administered 2015-08-05: 10 mL

## 2015-08-05 MED ORDER — SODIUM CHLORIDE 0.9 % IV SOLN
INTRAVENOUS | Status: DC | PRN
  Administered 2015-08-05: 13:00:00 via INTRAVENOUS

## 2015-08-05 MED ORDER — NEOSTIGMINE METHYLSULFATE 10 MG/10ML IV SOLN
INTRAVENOUS | Status: DC | PRN
  Administered 2015-08-05: 3 mg via INTRAVENOUS

## 2015-08-05 MED ORDER — FENTANYL CITRATE (PF) 250 MCG/5ML IJ SOLN
INTRAMUSCULAR | Status: DC | PRN
  Administered 2015-08-05 (×6): 50 ug via INTRAVENOUS

## 2015-08-05 MED ORDER — SUCCINYLCHOLINE CHLORIDE 200 MG/10ML IV SOSY
PREFILLED_SYRINGE | INTRAVENOUS | Status: AC
  Filled 2015-08-05: qty 20

## 2015-08-05 MED ORDER — LIDOCAINE HCL (PF) 1 % IJ SOLN
INTRAMUSCULAR | Status: AC
  Filled 2015-08-05: qty 30

## 2015-08-05 MED ORDER — GLYCOPYRROLATE 0.2 MG/ML IJ SOLN
INTRAMUSCULAR | Status: DC | PRN
  Administered 2015-08-05: 0.6 mg via INTRAVENOUS

## 2015-08-05 MED ORDER — ROCURONIUM BROMIDE 50 MG/5ML IV SOLN
INTRAVENOUS | Status: AC
  Filled 2015-08-05: qty 4

## 2015-08-05 MED ORDER — DIPHENHYDRAMINE HCL 50 MG/ML IJ SOLN: 12.5000 mg | Freq: Four times a day (QID) | INTRAMUSCULAR | Status: DC | PRN

## 2015-08-05 MED ORDER — PHENYLEPHRINE HCL 10 MG/ML IJ SOLN
10.0000 mg | INTRAVENOUS | Status: DC | PRN
  Administered 2015-08-05: 10 ug/min via INTRAVENOUS

## 2015-08-05 MED ORDER — SODIUM CHLORIDE 0.9 % IV SOLN
INTRAVENOUS | Status: DC
  Administered 2015-08-05: 12:00:00 via INTRAVENOUS

## 2015-08-05 MED ORDER — EPHEDRINE 5 MG/ML INJ
INTRAVENOUS | Status: AC
  Filled 2015-08-05: qty 10

## 2015-08-05 MED ORDER — HYDROMORPHONE HCL 1 MG/ML IJ SOLN
0.2500 mg | INTRAMUSCULAR | Status: DC | PRN
  Administered 2015-08-05 (×4): 0.5 mg via INTRAVENOUS

## 2015-08-05 MED ORDER — PROPOFOL 10 MG/ML IV BOLUS
INTRAVENOUS | Status: AC
  Filled 2015-08-05: qty 20

## 2015-08-05 MED ORDER — HYDROMORPHONE HCL 1 MG/ML IJ SOLN
INTRAMUSCULAR | Status: AC
  Filled 2015-08-05: qty 1

## 2015-08-05 MED ORDER — 0.9 % SODIUM CHLORIDE (POUR BTL) OPTIME
TOPICAL | Status: DC | PRN
  Administered 2015-08-05: 1000 mL

## 2015-08-05 MED ORDER — FENTANYL CITRATE (PF) 250 MCG/5ML IJ SOLN
INTRAMUSCULAR | Status: AC
  Filled 2015-08-05: qty 5

## 2015-08-05 MED ORDER — HYDROMORPHONE 1 MG/ML IV SOLN
INTRAVENOUS | Status: AC
  Administered 2015-08-05: 16:00:00
  Filled 2015-08-05: qty 25

## 2015-08-05 MED ORDER — NALOXONE HCL 0.4 MG/ML IJ SOLN: 0.4000 mg | INTRAMUSCULAR | Status: DC | PRN

## 2015-08-05 MED ORDER — PHENYLEPHRINE 40 MCG/ML (10ML) SYRINGE FOR IV PUSH (FOR BLOOD PRESSURE SUPPORT)
PREFILLED_SYRINGE | INTRAVENOUS | Status: AC
  Filled 2015-08-05: qty 10

## 2015-08-05 MED ORDER — PROPOFOL 10 MG/ML IV BOLUS
INTRAVENOUS | Status: DC | PRN
  Administered 2015-08-05: 110 mg via INTRAVENOUS

## 2015-08-05 MED ORDER — SODIUM CHLORIDE 0.9 % IV SOLN
INTRAVENOUS | Status: DC | PRN
  Administered 2015-08-05: 12:00:00 via INTRAVENOUS

## 2015-08-05 MED ORDER — ARTIFICIAL TEARS OP OINT
TOPICAL_OINTMENT | OPHTHALMIC | Status: AC
  Filled 2015-08-05: qty 3.5

## 2015-08-05 MED ORDER — ROCURONIUM BROMIDE 100 MG/10ML IV SOLN
INTRAVENOUS | Status: DC | PRN
  Administered 2015-08-05: 50 mg via INTRAVENOUS

## 2015-08-05 MED ORDER — SODIUM CHLORIDE 0.9 % IV SOLN: Freq: Once | INTRAVENOUS | Status: DC

## 2015-08-05 MED ORDER — SODIUM CHLORIDE 0.9 % IR SOLN
Status: DC | PRN
  Administered 2015-08-05: 1000 mL

## 2015-08-05 MED ORDER — ONDANSETRON HCL 4 MG/2ML IJ SOLN
INTRAMUSCULAR | Status: AC
  Filled 2015-08-05: qty 2

## 2015-08-05 MED ORDER — ONDANSETRON HCL 4 MG/2ML IJ SOLN
INTRAMUSCULAR | Status: DC | PRN
  Administered 2015-08-05: 4 mg via INTRAVENOUS

## 2015-08-05 MED ORDER — MIDAZOLAM HCL 5 MG/5ML IJ SOLN
INTRAMUSCULAR | Status: DC | PRN
  Administered 2015-08-05 (×2): 1 mg via INTRAVENOUS

## 2015-08-05 MED ORDER — MIDAZOLAM HCL 2 MG/2ML IJ SOLN
INTRAMUSCULAR | Status: AC
  Filled 2015-08-05: qty 2

## 2015-08-05 MED ORDER — CEFAZOLIN SODIUM-DEXTROSE 2-3 GM-% IV SOLR
INTRAVENOUS | Status: DC | PRN
  Administered 2015-08-05: 2 g via INTRAVENOUS

## 2015-08-05 MED ORDER — SUGAMMADEX SODIUM 200 MG/2ML IV SOLN
INTRAVENOUS | Status: AC
  Filled 2015-08-05: qty 4

## 2015-08-05 MED ORDER — BUPIVACAINE-EPINEPHRINE (PF) 0.25% -1:200000 IJ SOLN
INTRAMUSCULAR | Status: AC
  Filled 2015-08-05: qty 30

## 2015-08-05 MED ORDER — DIPHENHYDRAMINE HCL 12.5 MG/5ML PO ELIX: 12.5000 mg | ORAL_SOLUTION | Freq: Four times a day (QID) | ORAL | Status: DC | PRN

## 2015-08-05 MED ORDER — LIDOCAINE 2% (20 MG/ML) 5 ML SYRINGE
INTRAMUSCULAR | Status: AC
Start: 2015-08-05 — End: 2015-08-05
  Filled 2015-08-05: qty 10

## 2015-08-05 MED ORDER — HYDROMORPHONE 1 MG/ML IV SOLN
INTRAVENOUS | Status: DC
  Administered 2015-08-05: 3.6 mg via INTRAVENOUS
  Administered 2015-08-06: 2.1 mL via INTRAVENOUS
  Administered 2015-08-06: 1.2 mg via INTRAVENOUS
  Administered 2015-08-06: 1.8 mg via INTRAVENOUS
  Administered 2015-08-06: 3 mL via INTRAVENOUS
  Administered 2015-08-07: 1.3 mL via INTRAVENOUS

## 2015-08-05 MED ORDER — LIDOCAINE HCL (CARDIAC) 20 MG/ML IV SOLN
INTRAVENOUS | Status: DC | PRN
  Administered 2015-08-05: 50 mg via INTRATRACHEAL

## 2015-08-05 MED ORDER — ONDANSETRON HCL 4 MG/2ML IJ SOLN: 4.0000 mg | Freq: Four times a day (QID) | INTRAMUSCULAR | Status: DC | PRN

## 2015-08-05 SURGICAL SUPPLY — 80 items
APPLIER CLIP 5 13 M/L LIGAMAX5 (MISCELLANEOUS) ×2
APPLIER CLIP ROT 10 11.4 M/L (STAPLE) ×2
APR CLP MED LRG 11.4X10 (STAPLE) ×1
APR CLP MED LRG 5 ANG JAW (MISCELLANEOUS) ×1
BLADE SURG ROTATE 9660 (MISCELLANEOUS) ×1 IMPLANT
CANISTER SUCTION 2500CC (MISCELLANEOUS) ×2 IMPLANT
CELLS DAT CNTRL 66122 CELL SVR (MISCELLANEOUS) ×1 IMPLANT
CHLORAPREP W/TINT 26ML (MISCELLANEOUS) ×2 IMPLANT
CLIP APPLIE 5 13 M/L LIGAMAX5 (MISCELLANEOUS) IMPLANT
CLIP APPLIE ROT 10 11.4 M/L (STAPLE) IMPLANT
COVER MAYO STAND STRL (DRAPES) ×2 IMPLANT
COVER SURGICAL LIGHT HANDLE (MISCELLANEOUS) ×4 IMPLANT
DRAPE PROXIMA HALF (DRAPES) ×2 IMPLANT
DRAPE UTILITY XL STRL (DRAPES) ×10 IMPLANT
DRAPE WARM FLUID 44X44 (DRAPE) ×2 IMPLANT
DRSG OPSITE POSTOP 4X10 (GAUZE/BANDAGES/DRESSINGS) ×1 IMPLANT
DRSG OPSITE POSTOP 4X8 (GAUZE/BANDAGES/DRESSINGS) ×1 IMPLANT
DRSG TEGADERM 2-3/8X2-3/4 SM (GAUZE/BANDAGES/DRESSINGS) ×6 IMPLANT
ELECT BLADE 6.5 EXT (BLADE) ×2 IMPLANT
ELECT CAUTERY BLADE 6.4 (BLADE) ×4 IMPLANT
ELECT REM PT RETURN 9FT ADLT (ELECTROSURGICAL) ×2
ELECTRODE REM PT RTRN 9FT ADLT (ELECTROSURGICAL) ×1 IMPLANT
GAUZE SPONGE 4X4 16PLY NS LF (WOUND CARE) ×1 IMPLANT
GEL ULTRASOUND 20GR AQUASONIC (MISCELLANEOUS) IMPLANT
GLOVE BIO SURGEON STRL SZ 6 (GLOVE) ×4 IMPLANT
GLOVE BIOGEL PI IND STRL 6.5 (GLOVE) ×2 IMPLANT
GLOVE BIOGEL PI IND STRL 7.0 (GLOVE) IMPLANT
GLOVE BIOGEL PI INDICATOR 6.5 (GLOVE) ×2
GLOVE BIOGEL PI INDICATOR 7.0 (GLOVE) ×2
GLOVE ECLIPSE 7.0 STRL STRAW (GLOVE) ×2 IMPLANT
GOWN STRL REUS W/ TWL LRG LVL3 (GOWN DISPOSABLE) ×6 IMPLANT
GOWN STRL REUS W/TWL 2XL LVL3 (GOWN DISPOSABLE) ×4 IMPLANT
GOWN STRL REUS W/TWL LRG LVL3 (GOWN DISPOSABLE) ×14
KIT BASIN OR (CUSTOM PROCEDURE TRAY) ×2 IMPLANT
KIT ROOM TURNOVER OR (KITS) ×2 IMPLANT
LEGGING LITHOTOMY PAIR STRL (DRAPES) IMPLANT
LIGASURE IMPACT 36 18CM CVD LR (INSTRUMENTS) IMPLANT
NS IRRIG 1000ML POUR BTL (IV SOLUTION) ×4 IMPLANT
PAD ARMBOARD 7.5X6 YLW CONV (MISCELLANEOUS) ×4 IMPLANT
PENCIL BUTTON HOLSTER BLD 10FT (ELECTRODE) ×4 IMPLANT
RELOAD PROXIMATE 75MM BLUE (ENDOMECHANICALS) ×4 IMPLANT
RELOAD STAPLE 75 3.8 BLU REG (ENDOMECHANICALS) IMPLANT
RETRACTOR WND ALEXIS 18 MED (MISCELLANEOUS) IMPLANT
RTRCTR WOUND ALEXIS 18CM MED (MISCELLANEOUS) ×2
SCISSORS LAP 5X35 DISP (ENDOMECHANICALS) ×2 IMPLANT
SEALER TISSUE G2 STRG ARTC 35C (ENDOMECHANICALS) ×1 IMPLANT
SET IRRIG TUBING LAPAROSCOPIC (IRRIGATION / IRRIGATOR) IMPLANT
SLEEVE ENDOPATH XCEL 5M (ENDOMECHANICALS) ×3 IMPLANT
SPECIMEN JAR LARGE (MISCELLANEOUS) ×2 IMPLANT
SPONGE LAP 18X18 X RAY DECT (DISPOSABLE) ×1 IMPLANT
STAPLER GUN LINEAR PROX 60 (STAPLE) ×1 IMPLANT
STAPLER PROXIMATE 75MM BLUE (STAPLE) ×1 IMPLANT
STAPLER VISISTAT 35W (STAPLE) ×2 IMPLANT
SUCTION POOLE TIP (SUCTIONS) ×2 IMPLANT
SURGILUBE 2OZ TUBE FLIPTOP (MISCELLANEOUS) IMPLANT
SUT MNCRL AB 4-0 PS2 18 (SUTURE) ×1 IMPLANT
SUT PDS AB 1 CT  36 (SUTURE) ×2
SUT PDS AB 1 CT 36 (SUTURE) IMPLANT
SUT PROLENE 2 0 CT2 30 (SUTURE) IMPLANT
SUT PROLENE 2 0 KS (SUTURE) IMPLANT
SUT SILK 2 0 SH CR/8 (SUTURE) ×1 IMPLANT
SUT VIC AB 2-0 SH 18 (SUTURE) ×2 IMPLANT
SUT VIC AB 3-0 SH 18 (SUTURE) ×2 IMPLANT
SUT VICRYL AB 2 0 TIES (SUTURE) ×2 IMPLANT
SUT VICRYL AB 3 0 TIES (SUTURE) ×2 IMPLANT
SYR BULB IRRIGATION 50ML (SYRINGE) ×2 IMPLANT
SYS LAPSCP GELPORT 120MM (MISCELLANEOUS)
SYSTEM LAPSCP GELPORT 120MM (MISCELLANEOUS) IMPLANT
TOWEL OR 17X26 10 PK STRL BLUE (TOWEL DISPOSABLE) ×4 IMPLANT
TRAY FOLEY CATH 16FRSI W/METER (SET/KITS/TRAYS/PACK) ×1 IMPLANT
TRAY LAPAROSCOPIC MC (CUSTOM PROCEDURE TRAY) ×2 IMPLANT
TRAY PROCTOSCOPIC FIBER OPTIC (SET/KITS/TRAYS/PACK) IMPLANT
TROCAR XCEL 12X100 BLDLESS (ENDOMECHANICALS) IMPLANT
TROCAR XCEL BLUNT TIP 100MML (ENDOMECHANICALS) IMPLANT
TROCAR XCEL NON-BLD 11X100MML (ENDOMECHANICALS) ×1 IMPLANT
TROCAR XCEL NON-BLD 5MMX100MML (ENDOMECHANICALS) ×2 IMPLANT
TUBE CONNECTING 12X1/4 (SUCTIONS) ×4 IMPLANT
TUBING FILTER THERMOFLATOR (ELECTROSURGICAL) ×2 IMPLANT
TUBING INSUFFLATION (TUBING) ×2 IMPLANT
YANKAUER SUCT BULB TIP NO VENT (SUCTIONS) ×4 IMPLANT

## 2015-08-05 NOTE — Progress Notes (Signed)
Dr Barry Dienes updated, OK to DC foley and DC order d for blood transfusion from this morning. She is ordering labs to be drawn in PACU, then order to DC arterial line-pt to return to the rm he was in.

## 2015-08-05 NOTE — Progress Notes (Signed)
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    Objective: Vital signs in last 24 hours: Temp:  [98.1 F (36.7 C)-98.8 F (37.1 C)] 98.3 F (36.8 C) (06/23 0834) Pulse Rate:  [85-98] 88 (06/23 0834) Resp:  [17-19] 17 (06/23 0834) BP: (121-130)/(79-89) 122/89 mmHg (06/23 0834) SpO2:  [100 %] 100 % (06/23 0834) Last BM Date: 08/02/15  Intake/Output from previous day: 06/22 0701 - 06/23 0700 In: 240 [P.O.:240] Out: 0  Intake/Output this shift:    General appearance: alert, cooperative and no distress GI: soft, non distended, non tender.    Lab Results:   Recent Labs  08/04/15 0521 08/05/15 0414  WBC 8.4 8.2  HGB 8.0* 8.5*  HCT 25.0* 26.8*  PLT 270 308   BMET  Recent Labs  08/03/15 0508 08/04/15 0521  NA 137 138  K 4.4 3.6  CL 97* 99*  CO2 27 26  GLUCOSE 80 87  BUN 23* 11  CREATININE 8.42* 6.37*  CALCIUM 8.6* 7.9*   PT/INR  Recent Labs  08/04/15 1603  LABPROT 15.2  INR 1.18   ABG No results for input(s): PHART, HCO3 in the last 72 hours.  Invalid input(s): PCO2, PO2  Studies/Results: No results found.  Anti-infectives: Anti-infectives    Start     Dose/Rate Route Frequency Ordered Stop   08/04/15 2200  neomycin (MYCIFRADIN) tablet 1,000 mg     1,000 mg Oral 3 times per day 08/04/15 1802 08/05/15 2159   08/04/15 2200  metroNIDAZOLE (FLAGYL) tablet 1,000 mg     1,000 mg Oral 3 times per day 08/04/15 1802 08/05/15 2159   08/02/15 1600  [MAR Hold]  piperacillin-tazobactam (ZOSYN) IVPB 2.25 g     (MAR Hold since 08/05/15 1118)   2.25 g 100 mL/hr over 30 Minutes Intravenous Every 8 hours 08/02/15 1338     08/01/15 1600  piperacillin-tazobactam (ZOSYN) IVPB 2.25 g  Status:  Discontinued     2.25 g 100 mL/hr over 30 Minutes Intravenous Every 8 hours 08/01/15 0805 08/02/15 1338   08/01/15 0745  piperacillin-tazobactam (ZOSYN) IVPB 3.375 g     3.375 g 100 mL/hr over 30 Minutes Intravenous  Once 08/01/15 0736 08/01/15 0836       Assessment/Plan: s/p Procedure(s): LAPAROSCOPIC RIGHT COLECTOMY- ASCENDING (N/A) Will plan lap ascending colectomy.  Given the fact that he had acute GI bleed, think risks of proceeding despite anemia are probably lower than risk of going home and having another life threatening bleed. Reviewed surgery with patient including risks of breakdown of anastamosis, bleeding, damage to adjacent structures, risk of open operation, wound infection, hernia, heart or lung issues, blood clot. Do not need to wait for pathology given the fact that whether polyp is benign or malignant, it is too large to remove endoscopically and has bled.     LOS: 4 days    [facility] 08/05/2015

## 2015-08-05 NOTE — Anesthesia Procedure Notes (Signed)
Procedure Name: Intubation Date/Time: 08/05/2015 12:56 PM Performed by: Bethel Born Pre-anesthesia Checklist: Emergency Drugs available, Patient identified, Suction available, Patient being monitored and Timeout performed Patient Re-evaluated:Patient Re-evaluated prior to inductionOxygen Delivery Method: Circle system utilized Preoxygenation: Pre-oxygenation with 100% oxygen Intubation Type: IV induction Ventilation: Mask ventilation without difficulty Laryngoscope Size: Miller and 2 Grade View: Grade II Tube type: Oral Tube size: 7.5 mm Number of attempts: 1 Airway Equipment and Method: Stylet Placement Confirmation: ETT inserted through vocal cords under direct vision,  positive ETCO2 and breath sounds checked- equal and bilateral Secured at: 22 cm Tube secured with: Tape Dental Injury: Teeth and Oropharynx as per pre-operative assessment

## 2015-08-05 NOTE — Anesthesia Preprocedure Evaluation (Addendum)
Anesthesia Evaluation  Patient identified by MRN, date of birth, ID band Patient awake    Reviewed: Allergy & Precautions, H&P , NPO status , Patient's Chart, lab work & pertinent test results  Airway Mallampati: III  TM Distance: >3 FB Neck ROM: Full    Dental no notable dental hx. (+) Teeth Intact, Dental Advisory Given   Pulmonary sleep apnea and Continuous Positive Airway Pressure Ventilation , former smoker,    Pulmonary exam normal breath sounds clear to auscultation       Cardiovascular hypertension, Pt. on medications  Rhythm:Regular Rate:Normal     Neuro/Psych  Headaches, CVA negative psych ROS   GI/Hepatic Neg liver ROS, GERD  Medicated and Controlled,  Endo/Other  negative endocrine ROS  Renal/GU ESRF and DialysisRenal disease  negative genitourinary   Musculoskeletal   Abdominal   Peds  Hematology negative hematology ROS (+) anemia ,   Anesthesia Other Findings   Reproductive/Obstetrics negative OB ROS                            Anesthesia Physical Anesthesia Plan  ASA: III  Anesthesia Plan: General   Post-op Pain Management:    Induction: Intravenous  Airway Management Planned: Oral ETT  Additional Equipment:   Intra-op Plan:   Post-operative Plan: Extubation in OR  Informed Consent: I have reviewed the patients History and Physical, chart, labs and discussed the procedure including the risks, benefits and alternatives for the proposed anesthesia with the patient or authorized representative who has indicated his/her understanding and acceptance.   Dental advisory given  Plan Discussed with: CRNA  Anesthesia Plan Comments:         Anesthesia Quick Evaluation

## 2015-08-05 NOTE — Transfer of Care (Signed)
Immediate Anesthesia Transfer of Care Note  Patient: [name].  Procedure(s) Performed: Procedure(s): LAPAROSCOPIC RIGHT COLECTOMY- ASCENDING (N/A)  Patient Location: PACU  Anesthesia Type:General  Level of Consciousness: awake, alert , oriented and patient cooperative  Airway & Oxygen Therapy: Patient Spontanous Breathing and Patient connected to nasal cannula oxygen  Post-op Assessment: Report given to RN, Post -op Vital signs reviewed and stable and Patient moving all extremities X 4  Post vital signs: Reviewed and stable  Last Vitals:  Filed Vitals:   08/05/15 0610 08/05/15 0834  BP: 121/84 122/89  Pulse: 85 88  Temp: 36.9 C 36.8 C  Resp: 18 17    Last Pain:  Filed Vitals:   08/05/15 1250  PainSc: 0-No pain      Patients Stated Pain Goal: 0 ([id number])  Complications: No apparent anesthesia complications

## 2015-08-05 NOTE — Op Note (Signed)
Laparoscopic Right Hemicolectomy Procedure Note  Indications: This patient presents for a laparoscopic partial colectomy for cecal mass.  He presented with acute GI bleed.  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 The risks, benefits, complications, treatment options, and expected outcomes were discussed with the patient. The possibilities of reaction to medication, perforation of viscus, bleeding, recurrent infection, finding a normal colon, the need for additional procedures, failure to diagnose a condition, and creating a complication requiring transfusion or operation were discussed with the patient. The patient was advised of the risk of ostomy.  The patient concurred with the proposed plan, giving informed consent.   The patient was taken to the operating room, identified, and the procedure verified as partial colectomy. A Time Out was held and the above information confirmed.  The patient was brought to the operating room and placed supine. After induction of a general anesthetic, a Foley catheter was inserted and the abdomen was prepped and draped in standard fashion. The patient was placed into reverse the lumbar position and rotated to the right. A small incision at the costal margin was made with a #11 blade. Local anesthetic was introduced.  The 5 mm Optiview trocar was placed under direct visualization.  Pneumoperitoneum was insufflated to a pressure of 15 mm Hg. The laparoscope was introduced.    Exploration revealed a normal small bowel, gallbladder, liver, and stomach. The cecum was adherent to the abdominal  wall in the RLQ. Two 5 mm left sided 5-mm trocars were then placed after anesthetizing the skin and peritoneum with local anesthetic. The ascending colon and hepatic flexure were then mobilized with gentle retraction of the colon in a medial direction with mobilization of the peritoneal reflection with cautery and the EnSeal.  A portion of peritoneum was taken down in order to attempt to get good margins.  Metal clips were used to mark this site on the abdominal wall.  The omentum was taken off the transverse colon. A fourth port was placed in the right lateral abdomen.  Mobilization of this area was complete to expose the retroperitoneum.  There was minimal blood loss during this portion of the procedure.      The colon was evaluated to make sure it was adequately mobilized.  A 6 cm vertical incision was made above the umbilicus.  The subcutaneous tissues were divided with the cautery.  The fascia was the cautery as well. The fascial incision was carried out point the skin incision. The protractor was placed and the abdomen to protect the skin. The colon was delivered through the incision.  The colon was resected with a linear stapling device proximal and distal to the area in question in regard to the specimen. A portion of distal small bowel was taken as well as it was adherent to the cecum.  The mesenteric vessels were clamped and ligated. The tissues were extremely friable and several areas required suture ligation.The specimen was submitted to pathology.      An side-to-side, functional end-to-end anastomosis was performed through the small anterior incision with the linear stapling device. The mucosa was inspected and found to be hemostatic. Closure was achieved with the linear stapling  device. A 3-0 vicryl suture was used to reapproximate the angle of the anastomosis. Hemostasis was confirmed. The bowel anastomosis was returned.  The abdomen was irrigated.    The fascial incision was then closed with a  running 0 looped PDS suture. The incisions were closed with staples. TSoft dressings were applied.  Instrument, sponge, and needle counts were correct prior to abdominal closure and at the conclusion of the case.   Findings: nearly perforated cecal mass.  Adherent to abdominal wall.  Unclear if just inflammatory adherence or malignancy.  A portion of peritoneum was taken in this region and marked with clips.    Estimated Blood Loss: <50 mL         Drains: none  Specimens: right colon            Complications: None; patient tolerated the procedure well.         Disposition: PACU - hemodynamically stable.

## 2015-08-05 NOTE — Progress Notes (Signed)
[facility] Progress Note  Patient: Carl Stroebel Sr. [id number]   PCP: Lucrezia Starch, MD DOB: [birth year]   DOA: 08/01/2015   DOS: 08/05/2015   Date of Service: the patient was seen and examined on 08/05/2015  Subjective: The patient tolerated the procedure well. Denies any acute complaint. Pain is well tolerated on PCA. Nutrition: Tolerating diet  Brief hospital course: Pt. with PMH of ESRD on HD, OSA, HTN; admitted on 08/01/2015, with complaint of right lower quadrant pain and bleeding with passing out episode, was found to have BRBPR. CT scan was concerning for cecal thickening and colonoscopy was recommended try GI scheduled on Thursday. Currently further plan is continue monitoring course after colonoscopy.  Assessment and Plan: 1. GI bleed Large ulcer causing obstruction at the ileocecal wall concerning for carcinoma  CT abdomen suggested cecal wall thickening concerning for diverticulitis. Colonoscopies revealing an ulcer concerning for carcinoma. Biopsies are taken. General surgery is consulted and patient underwent colectomy with anastomosis 08/05/2015. Surgery identified nearly perforated cecal mass which is currently sent for pathology. Currently bleeding resolved. Continue IV Zosyn.  2. ESRD on hemodialysis. Currently euvolemic. Appreciate nephrology consultation. Continuing Sensipar Dialysis now scheduled on Saturday after the surgery.  3. Syncope. Likely from acute blood loss. No focal deficit no events on telemetry. We'll continue to closely monitor. Patient is ambulating without any assistance at present.  4. Acute blood loss anemia. Status post DDAVP 1, Feraheme 1. Refusing blood transfusion  Pain management: When necessary Tylenol Activity: Currently independent  Bowel regimen: last BM 08/02/2015 Diet: Clear liquid diet, DVT Prophylaxis: mechanical compression device  Advance goals of care discussion: Full code  Family Communication:  family was present at bedside, at the time of interview.   Disposition:  Discharge to likely home. Expected discharge date: 08/08/2015,postoperative recovery  Consultants: Gastroenterology, CCM primary admission, nephrology, general surgery Procedures: Colonoscopy, hemodialysis  Antibiotics: Anti-infectives    Start     Dose/Rate Route Frequency Ordered Stop   08/04/15 2200  neomycin (MYCIFRADIN) tablet 1,000 mg  Status:  Discontinued     1,000 mg Oral 3 times per day 08/04/15 1802 08/05/15 1659   08/04/15 2200  metroNIDAZOLE (FLAGYL) tablet 1,000 mg  Status:  Discontinued     1,000 mg Oral 3 times per day 08/04/15 1802 08/05/15 1659   08/02/15 1600  piperacillin-tazobactam (ZOSYN) IVPB 2.25 g     2.25 g 100 mL/hr over 30 Minutes Intravenous Every 8 hours 08/02/15 1338     08/01/15 1600  piperacillin-tazobactam (ZOSYN) IVPB 2.25 g  Status:  Discontinued     2.25 g 100 mL/hr over 30 Minutes Intravenous Every 8 hours 08/01/15 0805 08/02/15 1338   08/01/15 0745  piperacillin-tazobactam (ZOSYN) IVPB 3.375 g     3.375 g 100 mL/hr over 30 Minutes Intravenous  Once 08/01/15 0736 08/01/15 0836        Intake/Output Summary (Last 24 hours) at 08/05/15 1931 Last data filed at 08/05/15 1830  Gross per 24 hour  Intake    820 ml  Output     25 ml  Net    795 ml   Filed Weights   08/03/15 0804 08/03/15 1224 08/03/15 2038  Weight: 101.8 kg (224 lb 6.9 oz) 96.8 kg (213 lb 6.5 oz) 98.204 kg (216 lb 8 oz)    Objective: Physical Exam: Filed Vitals:   08/05/15 [id number] 08/05/15 1645 08/05/15 1706 08/05/15 1833  BP: 109/74  107/71   Pulse:  88 88   Temp:   97.6 F (  36.4 C)   TempSrc:   Oral   Resp:  14 16 16   Height:      Weight:      SpO2:  100% 100% 98%    General: Alert, Awake and Oriented to Time, Place and Person. Appear in NO distress Eyes: PERRL, Conjunctiva normal ENT: Oral Mucosa clear moist. Neck: no JVD, no Abnormal Mass Or lumps Cardiovascular: S1 and S2 Present, aortic  systolic Murmur, Respiratory: Bilateral Air entry equal and Decreased, Clear to Auscultation, no Crackles, no wheezes Abdomen: Bowel Sound present, Soft and no tenderness Skin: no redness, no Rash  Extremities: no Pedal edema, no calf tenderness Neurologic: Grossly no focal neuro deficit. Bilaterally Equal motor strength  Data Reviewed: CBC:  Recent Labs Lab 08/01/15 0610  08/02/15 0730 08/03/15 0508 08/04/15 0521 08/05/15 0414 08/05/15 1214 08/05/15 1645  WBC 15.3*  < > 7.9  7.8 8.6 8.4 8.2  --  14.0*  NEUTROABS 12.2*  --   --   --   --   --   --   --   HGB 7.6*  < > 7.6*  7.6* 7.6* 8.0* 8.5* 8.5* 7.9*  HCT 24.6*  < > 23.4*  23.3* 23.9* 25.0* 26.8* 25.0* 24.0*  MCV 89.5  < > 88.6  88.3 88.2 90.6 88.7  --  89.2  PLT 344  < > 218  209 242 270 308  --  286  < > = values in this interval not displayed. Basic Metabolic Panel:  Recent Labs Lab 08/01/15 0610 08/01/15 0627 08/02/15 0730 08/03/15 0508 08/04/15 0521 08/05/15 1214 08/05/15 1645  NA 137 138 135 137 138 138 138  K 4.1 4.0 3.6 4.4 3.6 3.4* 3.4*  CL 102 99* 98* 97* 99*  --  102  CO2 22  --  28 27 26   --  22  GLUCOSE 126* 123* 82 80 87 80 102*  BUN 57* 54* 17 23* 11  --  16  CREATININE 12.03* 12.60* 6.04* 8.42* 6.37*  --  8.93*  CALCIUM 7.9*  --  7.6* 8.6* 7.9*  --  7.3*  MG  --   --  1.7  --  2.1  --   --   PHOS  --   --   --   --  4.7*  --   --     Liver Function Tests:  Recent Labs Lab 08/01/15 0610 08/04/15 0521  AST 14*  --   ALT 11*  --   ALKPHOS 61  --   BILITOT 0.2*  --   PROT 6.0*  --   ALBUMIN 2.2* 2.2*    Recent Labs Lab 08/01/15 0610  LIPASE 124*   No results for input(s): AMMONIA in the last 168 hours. Coagulation Profile:  Recent Labs Lab 08/01/15 0610 08/04/15 1603  INR 1.14 1.18   Cardiac Enzymes:  Recent Labs Lab 08/01/15 0610  TROPONINI 0.03   BNP (last 3 results) No results for input(s): PROBNP in the last 8760 hours.  CBG:  Recent Labs Lab  08/01/15 1334 08/02/15 0840 08/02/15 1129  GLUCAP 90 81 78    Studies: No results found.   Scheduled Meds: . antiseptic oral rinse  7 mL Mouth Rinse BID  . cinacalcet  90 mg Oral BID WC  . darbepoetin (ARANESP) injection - DIALYSIS  200 mcg Intravenous Q Wed-HD  . doxercalciferol  2 mcg Intravenous Q M,W,F-HD  . folic acid  1 mg Intravenous Daily  . gabapentin  100 mg  Oral BID  . HYDROmorphone      . HYDROmorphone      . HYDROmorphone   Intravenous Q4H  . piperacillin-tazobactam (ZOSYN)  IV  2.25 g Intravenous Q8H   Continuous Infusions:  PRN Meds: sodium chloride, diphenhydrAMINE **OR** diphenhydrAMINE, HYDROmorphone (DILAUDID) injection, naloxone **AND** sodium chloride flush, ondansetron (ZOFRAN) IV, oxyCODONE-acetaminophen  Time spent: 30 minutes  Author: Berle Mull, MD [facility] Pager: [phone] 08/05/2015 7:31 PM  If 7PM-7AM, please contact night-coverage at [email or web address], password [name]

## 2015-08-05 NOTE — Progress Notes (Signed)
[facility] Rounding Note  Subjective/Interval Events:   Colonic mass on colonoscopy For surgical resection today "Ready to get this over with"  Objective Vital signs in last 24 hours: Filed Vitals:   08/04/15 1030 08/04/15 1647 08/04/15 1943 08/05/15 0610  BP: 118/74 130/79 124/81 121/84  Pulse: 87 85 98 85  Temp:  98.8 F (37.1 C) 98.1 F (36.7 C) 98.4 F (36.9 C)  TempSrc:  Oral Oral Oral  Resp: 12 19 18 18   Weight:      SpO2: 100% 100% 100% 100%   Weight change:   Physical Exam: General: Pleasant,alert OX3, NAD Heart: S1, S2, Gr1/6 M, no rub Lungs: CTA  Abdomen: soft non-tender, / NT active BS  Extremities: No LE edema.  Dialysis Access: LFA AVF + bruit with multiple aneurysmal areas  OP Dialysis Orders:  Center: [facility] MWF  4.5 hours  180  500 A 1.5  EDW 96.5  2K 2 Ca  right lower AVF  Hectorol 2  Heparin 5000 will now be discontinued indefinitely Mircera 50 q 2 last On 07/27/15  Recent OP  labs: hgb 10.0 On 6/14 21% sat ferritin 1476 iPTH 1242 P 5.6 Ca 8.6   Background 55 yo AAM with known diverticulosis, recent admission for cecal diverticulitis (6/8-6/10) readmitted with LGIB presumed diverticular in origin. Colonoscopy 6/22 showed an ulcerated partially obstructing mass in proximal ascending colon, near the ileocecal valve. Surgical resection 6/23.  Problem/Plan: 1.  LGIB  - Colonoscopy showing (in addition to tics and hemorrhoids) ulcerated partially obstructing  large mass in prox ascending colon with surgery today / biopsy yesterday with pathology pending. 2. ESRD - HD MWF - no heparin indefinitely. HD 08/03/15. Next tx tomorrow off schedule secondary to surgery today  3. Anemia - ESRD + ABLA - Transfused 2 units with HD on 6/19 for Hb 6.8. Hb 7.6> 8.0  yest  This am 8.5  Last Mircera 6/14. Rec'd Aranesp 200 08/03/15 and to continue weekly. Follow HGB.  4. Secondary hyperparathyroidism - PTH >1,000 . Hectorol with HD + sensipar 180 -  resume. Restart binders when on solids.4.7 phos yest. 5. HTN/volume - am vol okay and normotensive no meds. HD 08/03/15 Pre wt 101.8 kg Net UF 4800 Post wt 96.8 kg. Almost to OP EDW.=96.5 HD Sat off schedule sec surgery today    6. 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 [phone] 08/05/2015,8:11 AM  LOS: 4 days    I have seen and examined this patient and agree with plan and assessment in the above note. 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 Barry Dienes (laparoscopic ascending colectomy)Usual MWF HD to be delayed until Saturday. Labs and volume OK. On zosyn and flagyl.  Zakariah Dejarnette B,MD 08/05/2015 11:54 AM   Labs: Basic Metabolic Panel:  Recent Labs Lab 08/02/15 0730 08/03/15 0508 08/04/15 0521  NA 135 137 138  K 3.6 4.4 3.6  CL 98* 97* 99*  CO2 28 27 26   GLUCOSE 82 80 87  BUN 17 23* 11  CREATININE 6.04* 8.42* 6.37*  CALCIUM 7.6* 8.6* 7.9*  PHOS  --   --  4.7*     Recent Labs Lab 08/01/15 0610 08/04/15 0521  AST 14*  --   ALT 11*  --   ALKPHOS 61  --   BILITOT 0.2*  --   PROT 6.0*  --   ALBUMIN 2.2*  2.2*    Recent Labs Lab 08/01/15 0610  LIPASE 124*     Recent Labs Lab 08/01/15 0610  08/01/15 2348 08/02/15 0730 08/03/15 0508 08/04/15 0521 08/05/15 0414  WBC 15.3*  < > 9.1 7.9  7.8 8.6 8.4 8.2  NEUTROABS 12.2*  --   --   --   --   --   --   HGB 7.6*  < > 7.3* 7.6*  7.6* 7.6* 8.0* 8.5*  HCT 24.6*  < > 22.8* 23.4*  23.3* 23.9* 25.0* 26.8*  MCV 89.5  < > 88.0 88.6  88.3 88.2 90.6 88.7  PLT 344  < > 211 218  209 242 270 308  < > = values in this interval not displayed.   Recent Labs Lab 08/01/15 0610  TROPONINI 0.03     Recent Labs Lab 08/01/15 1334 08/02/15 0840 08/02/15 1129  GLUCAP 90 81 78    Medications:   . antiseptic oral rinse  7 mL Mouth Rinse  BID  . Chlorhexidine Gluconate Cloth  6 each Topical Once  . cinacalcet  90 mg Oral BID WC  . darbepoetin (ARANESP) injection - DIALYSIS  200 mcg Intravenous Q Wed-HD  . doxercalciferol  2 mcg Intravenous Q M,W,F-HD  . folic acid  1 mg Intravenous Daily  . gabapentin  100 mg Oral BID  . neomycin  1,000 mg Oral 3 times per day   And  . metroNIDAZOLE  1,000 mg Oral 3 times per day  . piperacillin-tazobactam (ZOSYN)  IV  2.25 g Intravenous Q8H

## 2015-08-06 LAB — COMPREHENSIVE METABOLIC PANEL
ALT: 9 U/L — ABNORMAL LOW (ref 17–63)
AST: 13 U/L — ABNORMAL LOW (ref 15–41)
Albumin: 2 g/dL — ABNORMAL LOW (ref 3.5–5.0)
Alkaline Phosphatase: 55 U/L (ref 38–126)
Anion gap: 14 (ref 5–15)
BUN: 19 mg/dL (ref 6–20)
CO2: 23 mmol/L (ref 22–32)
Calcium: 7.6 mg/dL — ABNORMAL LOW (ref 8.9–10.3)
Chloride: 100 mmol/L — ABNORMAL LOW (ref 101–111)
Creatinine, Ser: 10.2 mg/dL — ABNORMAL HIGH (ref 0.61–1.24)
GFR calc Af Amer: 6 mL/min — ABNORMAL LOW (ref >60)
GFR calc non Af Amer: 5 mL/min — ABNORMAL LOW (ref >60)
Glucose, Bld: 70 mg/dL (ref 65–99)
Potassium: 4 mmol/L (ref 3.5–5.1)
Sodium: 137 mmol/L (ref 135–145)
Total Bilirubin: 0.5 mg/dL (ref 0.3–1.2)
Total Protein: 6.2 g/dL — ABNORMAL LOW (ref 6.5–8.1)

## 2015-08-06 LAB — CBC WITH DIFFERENTIAL/PLATELET
Basophils Absolute: 0 10*3/uL (ref 0.0–0.1)
Basophils Relative: 0 %
Eosinophils Absolute: 0.1 10*3/uL (ref 0.0–0.7)
Eosinophils Relative: 1 %
HCT: 24.9 % — ABNORMAL LOW (ref 39.0–52.0)
Hemoglobin: 7.9 g/dL — ABNORMAL LOW (ref 13.0–17.0)
Lymphocytes Relative: 12 %
Lymphs Abs: 1.2 10*3/uL (ref 0.7–4.0)
MCH: 28.4 pg (ref 26.0–34.0)
MCHC: 31.7 g/dL (ref 30.0–36.0)
MCV: 89.6 fL (ref 78.0–100.0)
Monocytes Absolute: 0.6 10*3/uL (ref 0.1–1.0)
Monocytes Relative: 6 %
Neutro Abs: 8 10*3/uL — ABNORMAL HIGH (ref 1.7–7.7)
Neutrophils Relative %: 81 %
Platelets: 298 10*3/uL (ref 150–400)
RBC: 2.78 MIL/uL — ABNORMAL LOW (ref 4.22–5.81)
RDW: 16.7 % — ABNORMAL HIGH (ref 11.5–15.5)
WBC: 9.8 10*3/uL (ref 4.0–10.5)

## 2015-08-06 LAB — RENAL FUNCTION PANEL
Albumin: 2 g/dL — ABNORMAL LOW (ref 3.5–5.0)
Anion gap: 15 (ref 5–15)
BUN: 19 mg/dL (ref 6–20)
CO2: 22 mmol/L (ref 22–32)
Calcium: 7.7 mg/dL — ABNORMAL LOW (ref 8.9–10.3)
Chloride: 101 mmol/L (ref 101–111)
Creatinine, Ser: 10.13 mg/dL — ABNORMAL HIGH (ref 0.61–1.24)
GFR calc Af Amer: 6 mL/min — ABNORMAL LOW (ref >60)
GFR calc non Af Amer: 5 mL/min — ABNORMAL LOW (ref >60)
Glucose, Bld: 77 mg/dL (ref 65–99)
Phosphorus: 6.9 mg/dL — ABNORMAL HIGH (ref 2.5–4.6)
Potassium: 3.9 mmol/L (ref 3.5–5.1)
Sodium: 138 mmol/L (ref 135–145)

## 2015-08-06 LAB — CULTURE, BLOOD (ROUTINE X 2)
Culture: NO GROWTH
Culture: NO GROWTH

## 2015-08-06 MED ORDER — DOXERCALCIFEROL 4 MCG/2ML IV SOLN
INTRAVENOUS | Status: AC
  Administered 2015-08-06: 2 ug
  Filled 2015-08-06: qty 2

## 2015-08-06 NOTE — Progress Notes (Addendum)
1 Day Post-Op  Subjective: Seen in hemodialysis. Alert, comfortable, and seems to be doing well Playing games on his I- pad Tolerating clear liquids without nausea Pain well controlled Hemoglobin 7.9, stable.  WBC 9800.  Potassium 3.9.  Glucose 77.  Objective: Vital signs in last 24 hours: Temp:  [97.4 F (36.3 C)-98 F (36.7 C)] 98 F (36.7 C) (06/24 0710) Pulse Rate:  [81-100] 92 (06/24 0930) Resp:  [11-18] 15 (06/24 0710) BP: (107-149)/(71-95) 121/81 mmHg (06/24 0930) SpO2:  [93 %-100 %] 100 % (06/24 0710) Weight:  [98.6 kg (217 lb 6 oz)] 98.6 kg (217 lb 6 oz) (06/24 0710) Last BM Date: 08/05/15  Intake/Output from previous day: 06/23 0701 - 06/24 0700 In: 760 [P.O.:60; I.V.:700] Out: 25 [Blood:25] Intake/Output this shift:    General appearance: alert, no distress, and Mental status normal. Resp: clear to auscultation bilaterally GI: Soft.  Hypoactive bowel sounds.  Not distended.  Wounds look good.  Minimally tender.  Lab Results:  Results for orders placed or performed during the hospital encounter of 08/01/15 (from the past 24 hour(s))  Type and screen     Status: None (Preliminary result)   Collection Time: 08/05/15 10:50 AM  Result Value Ref Range   ABO/RH(D) O POS    Antibody Screen NEG    Sample Expiration 08/08/2015    Unit Number [id number]    Blood Component Type RED CELLS,LR    Unit division 00    Status of Unit ALLOCATED    Transfusion Status OK TO TRANSFUSE    Crossmatch Result Compatible    Unit Number [id number]    Blood Component Type RED CELLS,LR    Unit division 00    Status of Unit ALLOCATED    Transfusion Status OK TO TRANSFUSE    Crossmatch Result Compatible    Unit Number [id number]    Blood Component Type RED CELLS,LR    Unit division 00    Status of Unit ALLOCATED    Transfusion Status OK TO TRANSFUSE    Crossmatch Result Compatible    Unit Number [id number]    Blood Component Type RBC LR PHER2    Unit division 00     Status of Unit ALLOCATED    Transfusion Status OK TO TRANSFUSE    Crossmatch Result Compatible   Prepare RBC     Status: None   Collection Time: 08/05/15 10:50 AM  Result Value Ref Range   Order Confirmation ORDER PROCESSED BY BLOOD BANK   I-STAT 4, (NA,K, GLUC, HGB,HCT)     Status: Abnormal   Collection Time: 08/05/15 12:14 PM  Result Value Ref Range   Sodium 138 135 - 145 mmol/L   Potassium 3.4 (L) 3.5 - 5.1 mmol/L   Glucose, Bld 80 65 - 99 mg/dL   HCT 25.0 (L) 39.0 - 52.0 %   Hemoglobin 8.5 (L) 13.0 - 17.0 g/dL  I-STAT 4, (NA,K, GLUC, HGB,HCT)     Status: Abnormal   Collection Time: 08/05/15  1:23 PM  Result Value Ref Range   Sodium 137 135 - 145 mmol/L   Potassium 3.8 3.5 - 5.1 mmol/L   Glucose, Bld 88 65 - 99 mg/dL   HCT 23.0 (L) 39.0 - 52.0 %   Hemoglobin 7.8 (L) 13.0 - 17.0 g/dL  I-STAT 4, (NA,K, GLUC, HGB,HCT)     Status: Abnormal   Collection Time: 08/05/15  2:20 PM  Result Value Ref Range   Sodium 137 135 - 145 mmol/L   Potassium 3.7 3.5 -  5.1 mmol/L   Glucose, Bld 125 (H) 65 - 99 mg/dL   HCT 22.0 (L) 39.0 - 52.0 %   Hemoglobin 7.5 (L) 13.0 - 17.0 g/dL  CBC     Status: Abnormal   Collection Time: 08/05/15  4:45 PM  Result Value Ref Range   WBC 14.0 (H) 4.0 - 10.5 K/uL   RBC 2.69 (L) 4.22 - 5.81 MIL/uL   Hemoglobin 7.9 (L) 13.0 - 17.0 g/dL   HCT 24.0 (L) 39.0 - 52.0 %   MCV 89.2 78.0 - 100.0 fL   MCH 29.4 26.0 - 34.0 pg   MCHC 32.9 30.0 - 36.0 g/dL   RDW 16.6 (H) 11.5 - 15.5 %   Platelets 286 150 - 400 K/uL  Basic metabolic panel     Status: Abnormal   Collection Time: 08/05/15  4:45 PM  Result Value Ref Range   Sodium 138 135 - 145 mmol/L   Potassium 3.4 (L) 3.5 - 5.1 mmol/L   Chloride 102 101 - 111 mmol/L   CO2 22 22 - 32 mmol/L   Glucose, Bld 102 (H) 65 - 99 mg/dL   BUN 16 6 - 20 mg/dL   Creatinine, Ser 8.93 (H) 0.61 - 1.24 mg/dL   Calcium 7.3 (L) 8.9 - 10.3 mg/dL   GFR calc non Af Amer 6 (L) >60 mL/min   GFR calc Af Amer 7 (L) >60 mL/min   Anion  gap 14 5 - 15  CBC with Differential/Platelet     Status: Abnormal   Collection Time: 08/06/15  4:51 AM  Result Value Ref Range   WBC 9.8 4.0 - 10.5 K/uL   RBC 2.78 (L) 4.22 - 5.81 MIL/uL   Hemoglobin 7.9 (L) 13.0 - 17.0 g/dL   HCT 24.9 (L) 39.0 - 52.0 %   MCV 89.6 78.0 - 100.0 fL   MCH 28.4 26.0 - 34.0 pg   MCHC 31.7 30.0 - 36.0 g/dL   RDW 16.7 (H) 11.5 - 15.5 %   Platelets 298 150 - 400 K/uL   Neutrophils Relative % 81 %   Neutro Abs 8.0 (H) 1.7 - 7.7 K/uL   Lymphocytes Relative 12 %   Lymphs Abs 1.2 0.7 - 4.0 K/uL   Monocytes Relative 6 %   Monocytes Absolute 0.6 0.1 - 1.0 K/uL   Eosinophils Relative 1 %   Eosinophils Absolute 0.1 0.0 - 0.7 K/uL   Basophils Relative 0 %   Basophils Absolute 0.0 0.0 - 0.1 K/uL  Comprehensive metabolic panel     Status: Abnormal   Collection Time: 08/06/15  4:51 AM  Result Value Ref Range   Sodium 137 135 - 145 mmol/L   Potassium 4.0 3.5 - 5.1 mmol/L   Chloride 100 (L) 101 - 111 mmol/L   CO2 23 22 - 32 mmol/L   Glucose, Bld 70 65 - 99 mg/dL   BUN 19 6 - 20 mg/dL   Creatinine, Ser 10.20 (H) 0.61 - 1.24 mg/dL   Calcium 7.6 (L) 8.9 - 10.3 mg/dL   Total Protein 6.2 (L) 6.5 - 8.1 g/dL   Albumin 2.0 (L) 3.5 - 5.0 g/dL   AST 13 (L) 15 - 41 U/L   ALT 9 (L) 17 - 63 U/L   Alkaline Phosphatase 55 38 - 126 U/L   Total Bilirubin 0.5 0.3 - 1.2 mg/dL   GFR calc non Af Amer 5 (L) >60 mL/min   GFR calc Af Amer 6 (L) >60 mL/min   Anion gap 14  5 - 15  Renal function panel     Status: Abnormal   Collection Time: 08/06/15  7:25 AM  Result Value Ref Range   Sodium 138 135 - 145 mmol/L   Potassium 3.9 3.5 - 5.1 mmol/L   Chloride 101 101 - 111 mmol/L   CO2 22 22 - 32 mmol/L   Glucose, Bld 77 65 - 99 mg/dL   BUN 19 6 - 20 mg/dL   Creatinine, Ser 10.13 (H) 0.61 - 1.24 mg/dL   Calcium 7.7 (L) 8.9 - 10.3 mg/dL   Phosphorus 6.9 (H) 2.5 - 4.6 mg/dL   Albumin 2.0 (L) 3.5 - 5.0 g/dL   GFR calc non Af Amer 5 (L) >60 mL/min   GFR calc Af Amer 6 (L) >60 mL/min    Anion gap 15 5 - 15     Studies/Results: No results found.  Marland Kitchen antiseptic oral rinse  7 mL Mouth Rinse BID  . cinacalcet  90 mg Oral BID WC  . darbepoetin (ARANESP) injection - DIALYSIS  200 mcg Intravenous Q Wed-HD  . doxercalciferol  2 mcg Intravenous Q M,W,F-HD  . folic acid  1 mg Intravenous Daily  . gabapentin  100 mg Oral BID  . HYDROmorphone   Intravenous Q4H  . piperacillin-tazobactam (ZOSYN)  IV  2.25 g Intravenous Q8H     Assessment/Plan: s/p Procedure(s): LAPAROSCOPIC RIGHT COLECTOMY- ASCENDING  POD #1.  Right colectomy.  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 Clear liquids today Advance diet tomorrow if doing well Entereg  protocol May mobilize.  ESRD.  Per nephrology Jehovah's Witness but allows transfusion Anemia.  Chronic.  Tolerating well. Hypertension Secondary hyperparathyroidism  @PROBHOSP @  LOS: 5 days    Topaz Raglin M 08/06/2015

## 2015-08-06 NOTE — Progress Notes (Signed)
[facility] Rounding Note  Subjective/Interval Events:   Colonic mass resected yesterday Pathology is pending No NG and says getting sips with nausea or vomiting  Objective Vital signs in last 24 hours: Filed Vitals:   08/06/15 0500 08/06/15 0710 08/06/15 0714 08/06/15 0720  BP: 149/76 147/89 142/92 133/95  Pulse: 81 86 84 88  Temp: 97.5 F (36.4 C) 98 F (36.7 C)    TempSrc: Oral Oral    Resp: 18 15    Height:      Weight:  98.6 kg (217 lb 6 oz)    SpO2: 100% 100%     Weight change:   Physical Exam: General: Pleasant,alert OX3, NAD Heart: S1, S2, Gr1/6 M, no rub Lungs: CTA  Abdomen: Non-distended. 4 small laparotomy incisions plus a mid abd above the umbilicus w/waffle type dsg. All dry. Only an occasional tinkle. Extremities: No edema.  Dialysis Access: AVF cannulated  Labs: Basic Metabolic Panel:  Recent Labs Lab 08/04/15 0521  08/05/15 1645 08/06/15 0451 08/06/15 0725  NA 138  < > 138 137 138  K 3.6  < > 3.4* 4.0 3.9  CL 99*  --  102 100* 101  CO2 26  --  22 23 22   GLUCOSE 87  < > 102* 70 77  BUN 11  --  16 19 19   CREATININE 6.37*  --  8.93* 10.20* 10.13*  CALCIUM 7.9*  --  7.3* 7.6* 7.7*  PHOS 4.7*  --   --   --  6.9*  < > = values in this interval not displayed.   Recent Labs Lab 08/01/15 0610 08/04/15 0521 08/06/15 0451 08/06/15 0725  AST 14*  --  13*  --   ALT 11*  --  9*  --   ALKPHOS 61  --  55  --   BILITOT 0.2*  --  0.5  --   PROT 6.0*  --  6.2*  --   ALBUMIN 2.2* 2.2* 2.0* 2.0*    Recent Labs Lab 08/01/15 0610  LIPASE 124*     Recent Labs Lab 08/01/15 0610  08/03/15 0508 08/04/15 0521 08/05/15 0414  08/05/15 1420 08/05/15 1645 08/06/15 0451  WBC 15.3*  < > 8.6 8.4 8.2  --   --  14.0* 9.8  NEUTROABS 12.2*  --   --   --   --   --   --   --  8.0*  HGB 7.6*  < > 7.6* 8.0* 8.5*  < > 7.5* 7.9* 7.9*  HCT 24.6*  < > 23.9* 25.0* 26.8*  < > 22.0* 24.0* 24.9*  MCV 89.5  < > 88.2 90.6 88.7  --   --  89.2 89.6  PLT 344  < > 242 270 308   --   --  286 298  < > = values in this interval not displayed.   Recent Labs Lab 08/01/15 0610  TROPONINI 0.03     Recent Labs Lab 08/01/15 1334 08/02/15 0840 08/02/15 1129  GLUCAP 90 81 78    Medications:   . antiseptic oral rinse  7 mL Mouth Rinse BID  . cinacalcet  90 mg Oral BID WC  . darbepoetin (ARANESP) injection - DIALYSIS  200 mcg Intravenous Q Wed-HD  . doxercalciferol  2 mcg Intravenous Q M,W,F-HD  . folic acid  1 mg Intravenous Daily  . gabapentin  100 mg Oral BID  . HYDROmorphone   Intravenous Q4H  . piperacillin-tazobactam (ZOSYN)  IV  2.25 g Intravenous Q8H  OP Dialysis Orders:  Center: [facility] M[facility]  4.5 hours  180  500 A 1.5  EDW 96.5  2K 2 Ca  right lower AVF  Hectorol 2  Heparin 5000 will now be discontinued indefinitely Mircera 50 q 2 last On 07/27/15  Recent OP  labs: hgb 10.0 On 6/14 21% sat ferritin 1476 iPTH 1242 P 5.6 Ca 8.6   Background 55 yo AAM with known diverticulosis, recent admission for cecal diverticulitis (6/8-6/10) readmitted with LGIB presumed diverticular in origin. Colonoscopy 6/22 showed an ulcerated partially obstructing mass in proximal ascending colon, near the ileocecal valve. Surgical resection 6/23.  Problem/Plan: 1. LGIB  - stable. Last transfused 6/19. (tics, hemorrhoids and mass on colonoscopy 6/22) 2. Ulcerated partially obstructing  large mass in prox ascending colon s/p laparoscopic colon resection  6/23. Adherent to ant abd wasll and near perforated at surgery. Suspicious for CA. Path is pending (path from biopsies done during colonoscopy- inflammatory changes). Zosyn.  3. ESRD - HD MWF - no heparin indefinitely. HD today off schedule d/t surg 6/23. Return to MWF 1st of the week. No heparin indefinitely. PreHD weight 98.6 EDW 96.5. Goal UF 2 liters 4. Anemia - ESRD + ABLA - Transfused 2 units 6/19 for Hb 6.8. Hb 8.5 preop -> 7.9 post op and stable so far. Last Mircera 6/14. Rec'd Aranesp 200 08/03/15  and to continue weekly. Follow HGB.  5. Secondary hyperparathyroidism - PTH >1,000 . Hectorol with HD + sensipar 180 - resume. Restart binders when gi stable 6. Jehovah Witness but allows transfusion   Jamal Maes, MD [facility] [phone] Pager 08/06/2015, 8:08 AM

## 2015-08-06 NOTE — Procedures (Signed)
I have personally attended this patient's dialysis session.   4K bath for K 3.9 2 liter goal R AVF no issues No heparin  Jamal Maes, MD [facility] [phone] Pager 08/06/2015, 8:13 AM

## 2015-08-06 NOTE — Progress Notes (Signed)
[facility] Progress Note  Patient: Carl Bamonte Sr. [id number]   PCP: Lucrezia Starch, MD DOB: [birth year]   DOA: 08/01/2015   DOS: 08/06/2015   Date of Service: the patient was seen and examined on 08/06/2015  Subjective: Patient denies any nausea or vomiting. Tolerating diet. Abdominal pain is well-controlled with PCA. Nutrition: Tolerating diet  Brief hospital course: Pt. with PMH of ESRD on HD, OSA, HTN; admitted on 08/01/2015, with complaint of right lower quadrant pain and bleeding with passing out episode, was found to have BRBPR. CT scan was concerning for cecal thickening and colonoscopy was recommended try GI scheduled on Thursday. Currently further plan is continue monitoring course after colonoscopy.  Assessment and Plan: 1. GI bleed Large ulcer causing obstruction at the ileocecal wall concerning for carcinoma  CT abdomen suggested cecal wall thickening concerning for diverticulitis. Colonoscopies revealing an ulcer concerning for carcinoma. Biopsies are taken. General surgery is consulted and patient underwent colectomy with anastomosis 08/05/2015. Surgery identified nearly perforated cecal mass which is currently sent for pathology. Currently bleeding resolved. Continue IV Zosyn. Finish 7 day treatment course.  2. ESRD on hemodialysis. Currently euvolemic. Appreciate nephrology consultation. Continuing Sensipar Dialysis now scheduled on Saturday after the surgery.  3. Syncope. Likely from acute blood loss. No focal deficit no events on telemetry. We'll continue to closely monitor. Patient is ambulating without any assistance at present.  4. Acute blood loss anemia. Status post DDAVP 1, Feraheme 1. Refusing blood transfusion. Need to monitor for significant anemia.  Pain management: When necessary Tylenol Activity: Currently independent  Bowel regimen: last BM 08/02/2015 Diet: Clear liquid diet, DVT Prophylaxis: mechanical compression  device  Advance goals of care discussion: Full code  Family Communication: no family was present at bedside, at the time of interview.   Disposition:  Discharge to likely home. Expected discharge date: 08/09/2015, postoperative recovery  Consultants: Gastroenterology, CCM primary admission, nephrology, general surgery Procedures: Colonoscopy, hemodialysis  Antibiotics: Anti-infectives    Start     Dose/Rate Route Frequency Ordered Stop   08/04/15 2200  neomycin (MYCIFRADIN) tablet 1,000 mg  Status:  Discontinued     1,000 mg Oral 3 times per day 08/04/15 1802 08/05/15 1659   08/04/15 2200  metroNIDAZOLE (FLAGYL) tablet 1,000 mg  Status:  Discontinued     1,000 mg Oral 3 times per day 08/04/15 1802 08/05/15 1659   08/02/15 1600  piperacillin-tazobactam (ZOSYN) IVPB 2.25 g     2.25 g 100 mL/hr over 30 Minutes Intravenous Every 8 hours 08/02/15 1338     08/01/15 1600  piperacillin-tazobactam (ZOSYN) IVPB 2.25 g  Status:  Discontinued     2.25 g 100 mL/hr over 30 Minutes Intravenous Every 8 hours 08/01/15 0805 08/02/15 1338   08/01/15 0745  piperacillin-tazobactam (ZOSYN) IVPB 3.375 g     3.375 g 100 mL/hr over 30 Minutes Intravenous  Once 08/01/15 0736 08/01/15 0836        Intake/Output Summary (Last 24 hours) at 08/06/15 1815 Last data filed at 08/06/15 1441  Gross per 24 hour  Intake    300 ml  Output   1075 ml  Net   -775 ml   Filed Weights   08/03/15 2038 08/06/15 0710 08/06/15 1103  Weight: 98.204 kg (216 lb 8 oz) 98.6 kg (217 lb 6 oz) 97.2 kg (214 lb 4.6 oz)    Objective: Physical Exam: Filed Vitals:   08/06/15 1158 08/06/15 1245 08/06/15 1640 08/06/15 1713  BP: 123/79   131/85  Pulse: 92  98  Temp: 98.2 F (36.8 C)   98.2 F (36.8 C)  TempSrc: Oral   Oral  Resp: 18 18 17 19   Height:      Weight:      SpO2: 99% 94% 96% 98%    General: Alert, Awake and Oriented to Time, Place and Person. Appear in NO distress Eyes: PERRL, Conjunctiva normal ENT: Oral  Mucosa clear moist. Neck: no JVD, no Abnormal Mass Or lumps Cardiovascular: S1 and S2 Present, aortic systolic Murmur, Respiratory: Bilateral Air entry equal and Decreased, Clear to Auscultation, no Crackles, no wheezes Abdomen: Bowel Sound Sluggish, Soft and mild tenderness Skin: no redness, no Rash  Extremities: no Pedal edema, no calf tenderness Neurologic: Grossly no focal neuro deficit. Bilaterally Equal motor strength  Data Reviewed: CBC:  Recent Labs Lab 08/01/15 0610  08/03/15 0508 08/04/15 0521 08/05/15 0414 08/05/15 1214 08/05/15 1323 08/05/15 1420 08/05/15 1645 08/06/15 0451  WBC 15.3*  < > 8.6 8.4 8.2  --   --   --  14.0* 9.8  NEUTROABS 12.2*  --   --   --   --   --   --   --   --  8.0*  HGB 7.6*  < > 7.6* 8.0* 8.5* 8.5* 7.8* 7.5* 7.9* 7.9*  HCT 24.6*  < > 23.9* 25.0* 26.8* 25.0* 23.0* 22.0* 24.0* 24.9*  MCV 89.5  < > 88.2 90.6 88.7  --   --   --  89.2 89.6  PLT 344  < > 242 270 308  --   --   --  286 298  < > = values in this interval not displayed. Basic Metabolic Panel:  Recent Labs Lab 08/02/15 0730 08/03/15 0508 08/04/15 0521  08/05/15 1323 08/05/15 1420 08/05/15 1645 08/06/15 0451 08/06/15 0725  NA 135 137 138  < > 137 137 138 137 138  K 3.6 4.4 3.6  < > 3.8 3.7 3.4* 4.0 3.9  CL 98* 97* 99*  --   --   --  102 100* 101  CO2 28 27 26   --   --   --  22 23 22   GLUCOSE 82 80 87  < > 88 125* 102* 70 77  BUN 17 23* 11  --   --   --  16 19 19   CREATININE 6.04* 8.42* 6.37*  --   --   --  8.93* 10.20* 10.13*  CALCIUM 7.6* 8.6* 7.9*  --   --   --  7.3* 7.6* 7.7*  MG 1.7  --  2.1  --   --   --   --   --   --   PHOS  --   --  4.7*  --   --   --   --   --  6.9*  < > = values in this interval not displayed.  Liver Function Tests:  Recent Labs Lab 08/01/15 0610 08/04/15 0521 08/06/15 0451 08/06/15 0725  AST 14*  --  13*  --   ALT 11*  --  9*  --   ALKPHOS 61  --  55  --   BILITOT 0.2*  --  0.5  --   PROT 6.0*  --  6.2*  --   ALBUMIN 2.2* 2.2* 2.0*  2.0*    Recent Labs Lab 08/01/15 0610  LIPASE 124*   No results for input(s): AMMONIA in the last 168 hours. Coagulation Profile:  Recent Labs Lab 08/01/15 0610 08/04/15 1603  INR  1.14 1.18   Cardiac Enzymes:  Recent Labs Lab 08/01/15 0610  TROPONINI 0.03   BNP (last 3 results) No results for input(s): PROBNP in the last 8760 hours.  CBG:  Recent Labs Lab 08/01/15 1334 08/02/15 0840 08/02/15 1129  GLUCAP 90 81 78    Studies: No results found.   Scheduled Meds: . antiseptic oral rinse  7 mL Mouth Rinse BID  . cinacalcet  90 mg Oral BID WC  . darbepoetin (ARANESP) injection - DIALYSIS  200 mcg Intravenous Q Wed-HD  . doxercalciferol  2 mcg Intravenous Q M,W,F-HD  . folic acid  1 mg Intravenous Daily  . gabapentin  100 mg Oral BID  . HYDROmorphone   Intravenous Q4H  . piperacillin-tazobactam (ZOSYN)  IV  2.25 g Intravenous Q8H   Continuous Infusions:  PRN Meds: sodium chloride, diphenhydrAMINE **OR** diphenhydrAMINE, HYDROmorphone (DILAUDID) injection, naloxone **AND** sodium chloride flush, ondansetron (ZOFRAN) IV, oxyCODONE-acetaminophen  Time spent: 30 minutes  Author: Berle Mull, MD [facility] Pager: [phone] 08/06/2015 6:15 PM  If 7PM-7AM, please contact night-coverage at [email or web address], password [facility]

## 2015-08-07 LAB — CBC
HCT: 27.6 % — ABNORMAL LOW (ref 39.0–52.0)
Hemoglobin: 8.4 g/dL — ABNORMAL LOW (ref 13.0–17.0)
MCH: 27.9 pg (ref 26.0–34.0)
MCHC: 30.4 g/dL (ref 30.0–36.0)
MCV: 91.7 fL (ref 78.0–100.0)
Platelets: 335 10*3/uL (ref 150–400)
RBC: 3.01 MIL/uL — ABNORMAL LOW (ref 4.22–5.81)
RDW: 17.1 % — ABNORMAL HIGH (ref 11.5–15.5)
WBC: 8.8 10*3/uL (ref 4.0–10.5)

## 2015-08-07 LAB — RENAL FUNCTION PANEL
Albumin: 2.3 g/dL — ABNORMAL LOW (ref 3.5–5.0)
Anion gap: 12 (ref 5–15)
BUN: 10 mg/dL (ref 6–20)
CO2: 25 mmol/L (ref 22–32)
Calcium: 7.6 mg/dL — ABNORMAL LOW (ref 8.9–10.3)
Chloride: 99 mmol/L — ABNORMAL LOW (ref 101–111)
Creatinine, Ser: 6.55 mg/dL — ABNORMAL HIGH (ref 0.61–1.24)
GFR calc Af Amer: 10 mL/min — ABNORMAL LOW (ref >60)
GFR calc non Af Amer: 8 mL/min — ABNORMAL LOW (ref >60)
Glucose, Bld: 74 mg/dL (ref 65–99)
Phosphorus: 4.2 mg/dL (ref 2.5–4.6)
Potassium: 4.4 mmol/L (ref 3.5–5.1)
Sodium: 136 mmol/L (ref 135–145)

## 2015-08-07 MED ORDER — HYDROMORPHONE HCL 1 MG/ML IJ SOLN
1.0000 mg | INTRAMUSCULAR | Status: DC | PRN
  Administered 2015-08-08 – 2015-08-09 (×2): 1 mg via INTRAVENOUS
  Filled 2015-08-07: qty 1

## 2015-08-07 MED ORDER — OXYCODONE-ACETAMINOPHEN 5-325 MG PO TABS
1.0000 | ORAL_TABLET | ORAL | Status: DC | PRN
  Administered 2015-08-07 – 2015-08-08 (×4): 2 via ORAL
  Filled 2015-08-07 (×4): qty 2

## 2015-08-07 NOTE — Progress Notes (Signed)
2 Days Post-Op  Subjective: Tolerating clear liquid diet without nausea No stool or flatus Still using PCA but is does not appear to be in any pain at present Hemoglobin 8.4.  WBC 8.8. Pathology pending  Objective: Vital signs in last 24 hours: Temp:  [98 F (36.7 C)-98.5 F (36.9 C)] 98.4 F (36.9 C) (06/25 0434) Pulse Rate:  [84-105] 85 (06/25 0434) Resp:  [15-20] 18 (06/25 0434) BP: (113-147)/(67-101) 127/101 mmHg (06/25 0434) SpO2:  [94 %-100 %] 100 % (06/25 0434) FiO2 (%):  [94 %-96 %] 96 % (06/24 1640) Weight:  [97.2 kg (214 lb 4.6 oz)-98.6 kg (217 lb 6 oz)] 97.2 kg (214 lb 4.6 oz) (06/24 1103) Last BM Date: 08/05/15  Intake/Output from previous day: 06/24 0701 - 06/25 0700 In: 1220 [P.O.:1080; I.V.:40; IV Piggyback:100] Out: 1076 [Urine:1] Intake/Output this shift: Total I/O In: 740 [P.O.:600; I.V.:40; IV Piggyback:100] Out: 1 [Urine:1]   EXAM::  General appearance: Alert.  Cooperative.  Mental status normal.  Appears quite comfortable Resp: Clear to auscultation bilaterally GI: Abdomen soft.  Nondistended.  Wounds are clean.  Minimal tenderness.  Occasional bowel sounds.   Lab Results:  Results for orders placed or performed during the hospital encounter of 08/01/15 (from the past 24 hour(s))  Renal function panel     Status: Abnormal   Collection Time: 08/06/15  7:25 AM  Result Value Ref Range   Sodium 138 135 - 145 mmol/L   Potassium 3.9 3.5 - 5.1 mmol/L   Chloride 101 101 - 111 mmol/L   CO2 22 22 - 32 mmol/L   Glucose, Bld 77 65 - 99 mg/dL   BUN 19 6 - 20 mg/dL   Creatinine, Ser 10.13 (H) 0.61 - 1.24 mg/dL   Calcium 7.7 (L) 8.9 - 10.3 mg/dL   Phosphorus 6.9 (H) 2.5 - 4.6 mg/dL   Albumin 2.0 (L) 3.5 - 5.0 g/dL   GFR calc non Af Amer 5 (L) >60 mL/min   GFR calc Af Amer 6 (L) >60 mL/min   Anion gap 15 5 - 15  CBC     Status: Abnormal   Collection Time: 08/07/15  5:23 AM  Result Value Ref Range   WBC 8.8 4.0 - 10.5 K/uL   RBC 3.01 (L) 4.22 - 5.81  MIL/uL   Hemoglobin 8.4 (L) 13.0 - 17.0 g/dL   HCT 27.6 (L) 39.0 - 52.0 %   MCV 91.7 78.0 - 100.0 fL   MCH 27.9 26.0 - 34.0 pg   MCHC 30.4 30.0 - 36.0 g/dL   RDW 17.1 (H) 11.5 - 15.5 %   Platelets 335 150 - 400 K/uL     Studies/Results: No results found.  Marland Kitchen antiseptic oral rinse  7 mL Mouth Rinse BID  . cinacalcet  90 mg Oral BID WC  . darbepoetin (ARANESP) injection - DIALYSIS  200 mcg Intravenous Q Wed-HD  . doxercalciferol  2 mcg Intravenous Q M,W,F-HD  . folic acid  1 mg Intravenous Daily  . gabapentin  100 mg Oral BID  . HYDROmorphone   Intravenous Q4H  . piperacillin-tazobactam (ZOSYN)  IV  2.25 g Intravenous Q8H     Assessment/Plan: s/p Procedure(s): LAPAROSCOPIC RIGHT COLECTOMY- ASCENDING  POD #2. Right colectomy. 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 Advance to full liquids Entereg protocol Ambulate Check pathology Monday/Tuesday Wean off PCA.  ESRD. Per nephrology Jehovah's Witness but allows transfusion Anemia. Chronic. Tolerating well. Hypertension Secondary hyperparathyroidism  @PROBHOSP @  LOS: 6 days    Ethin Drummond M 08/07/2015  . Marland Kitchen  prob

## 2015-08-07 NOTE — Progress Notes (Signed)
[facility] Progress Note  Patient: Carl Pach Sr. [id number]   PCP: Lucrezia Starch, MD DOB: [birth year]   DOA: 08/01/2015   DOS: 08/07/2015   Date of Service: the patient was seen and examined on 08/07/2015  Subjective: Patient is now off of the PCA pump and the pain continues to remain well controlled. Tolerating liquid diet no vomiting no nausea. No bowel movement.  Nutrition: Tolerating diet  Brief hospital course: Pt. with PMH of ESRD on HD, OSA, HTN; admitted on 08/01/2015, with complaint of right lower quadrant pain and bleeding with passing out episode, was found to have BRBPR. CT scan was concerning for cecal thickening and underwent colonoscopy followed by colectomy. Currently further plan is continue monitoring course after colectomy  Assessment and Plan: 1. GI bleed Large ulcer causing obstruction at the ileocecal wall concerning for carcinoma  CT abdomen suggested cecal wall thickening concerning for diverticulitis. Colonoscopies revealing an ulcer concerning for carcinoma. Biopsies are taken. General surgery is consulted and patient underwent colectomy with anastomosis 08/05/2015. Surgery identified nearly perforated cecal mass which is currently sent for pathology. Currently bleeding resolved. Continue IV Zosyn. Finish 7 day treatment course, last day 08/08/2015  2. ESRD on hemodialysis. Currently euvolemic. Appreciate nephrology consultation. Continuing Sensipar  3. Syncope. Likely from acute blood loss. No focal deficit no events on telemetry. Patient is ambulating without any assistance at present.  4. Acute blood loss anemia. Status post DDAVP 1, Feraheme 1. Refusing blood transfusion. Need to monitor for significant anemia.  Pain management: When necessary Tylenol Activity: Currently independent  Bowel regimen: last BM 08/05/2015 Diet: full liquid diet, DVT Prophylaxis: mechanical compression device  Advance goals of care discussion: Full  code  Family Communication: no family was present at bedside, at the time of interview.   Disposition:  Discharge to likely home. Expected discharge date: 08/09/2015, postoperative recovery  Consultants: Gastroenterology, CCM primary admission, nephrology, general surgery Procedures: Colonoscopy, hemodialysis  Antibiotics: Anti-infectives    Start     Dose/Rate Route Frequency Ordered Stop   08/04/15 2200  neomycin (MYCIFRADIN) tablet 1,000 mg  Status:  Discontinued     1,000 mg Oral 3 times per day 08/04/15 1802 08/05/15 1659   08/04/15 2200  metroNIDAZOLE (FLAGYL) tablet 1,000 mg  Status:  Discontinued     1,000 mg Oral 3 times per day 08/04/15 1802 08/05/15 1659   08/02/15 1600  piperacillin-tazobactam (ZOSYN) IVPB 2.25 g     2.25 g 100 mL/hr over 30 Minutes Intravenous Every 8 hours 08/02/15 1338     08/01/15 1600  piperacillin-tazobactam (ZOSYN) IVPB 2.25 g  Status:  Discontinued     2.25 g 100 mL/hr over 30 Minutes Intravenous Every 8 hours 08/01/15 0805 08/02/15 1338   08/01/15 0745  piperacillin-tazobactam (ZOSYN) IVPB 3.375 g     3.375 g 100 mL/hr over 30 Minutes Intravenous  Once 08/01/15 0736 08/01/15 0836        Intake/Output Summary (Last 24 hours) at 08/07/15 1659 Last data filed at 08/07/15 1426  Gross per 24 hour  Intake   1590 ml  Output      1 ml  Net   1589 ml   Filed Weights   08/03/15 2038 08/06/15 0710 08/06/15 1103  Weight: 98.204 kg (216 lb 8 oz) 98.6 kg (217 lb 6 oz) 97.2 kg (214 lb 4.6 oz)    Objective: Physical Exam: Filed Vitals:   08/07/15 0421 08/07/15 0434 08/07/15 0811 08/07/15 1621  BP:  127/101 129/87 137/96  Pulse:  85 105 85  Temp:  98.4 F (36.9 C) 98.3 F (36.8 C) 98.2 F (36.8 C)  TempSrc:  Oral Oral Oral  Resp: 20 18 17 16   Height:      Weight:      SpO2: 98% 100% 99% 98%    General: Alert, Awake and Oriented to Time, Place and Person. Appear in NO distress Eyes: PERRL, Conjunctiva normal ENT: Oral Mucosa clear  moist. Neck: no JVD, no Abnormal Mass Or lumps Cardiovascular: S1 and S2 Present, aortic systolic Murmur, Respiratory: Bilateral Air entry equal and Decreased, Clear to Auscultation, no Crackles, no wheezes Abdomen: Bowel Sound Sluggish, Soft and mild tenderness Skin: no redness, no Rash  Extremities: no Pedal edema, no calf tenderness Neurologic: Grossly no focal neuro deficit. Bilaterally Equal motor strength  Data Reviewed: CBC:  Recent Labs Lab 08/01/15 0610  08/04/15 0521 08/05/15 0414  08/05/15 1323 08/05/15 1420 08/05/15 1645 08/06/15 0451 08/07/15 0523  WBC 15.3*  < > 8.4 8.2  --   --   --  14.0* 9.8 8.8  NEUTROABS 12.2*  --   --   --   --   --   --   --  8.0*  --   HGB 7.6*  < > 8.0* 8.5*  < > 7.8* 7.5* 7.9* 7.9* 8.4*  HCT 24.6*  < > 25.0* 26.8*  < > 23.0* 22.0* 24.0* 24.9* 27.6*  MCV 89.5  < > 90.6 88.7  --   --   --  89.2 89.6 91.7  PLT 344  < > 270 308  --   --   --  286 298 335  < > = values in this interval not displayed. Basic Metabolic Panel:  Recent Labs Lab 08/02/15 0730  08/04/15 0521  08/05/15 1420 08/05/15 1645 08/06/15 0451 08/06/15 0725 08/07/15 0523  NA 135  < > 138  < > 137 138 137 138 136  K 3.6  < > 3.6  < > 3.7 3.4* 4.0 3.9 4.4  CL 98*  < > 99*  --   --  102 100* 101 99*  CO2 28  < > 26  --   --  22 23 22 25   GLUCOSE 82  < > 87  < > 125* 102* 70 77 74  BUN 17  < > 11  --   --  16 19 19 10   CREATININE 6.04*  < > 6.37*  --   --  8.93* 10.20* 10.13* 6.55*  CALCIUM 7.6*  < > 7.9*  --   --  7.3* 7.6* 7.7* 7.6*  MG 1.7  --  2.1  --   --   --   --   --   --   PHOS  --   --  4.7*  --   --   --   --  6.9* 4.2  < > = values in this interval not displayed.  Liver Function Tests:  Recent Labs Lab 08/01/15 0610 08/04/15 0521 08/06/15 0451 08/06/15 0725 08/07/15 0523  AST 14*  --  13*  --   --   ALT 11*  --  9*  --   --   ALKPHOS 61  --  55  --   --   BILITOT 0.2*  --  0.5  --   --   PROT 6.0*  --  6.2*  --   --   ALBUMIN 2.2* 2.2* 2.0*  2.0* 2.3*    Recent Labs Lab  08/01/15 0610  LIPASE 124*   No results for input(s): AMMONIA in the last 168 hours. Coagulation Profile:  Recent Labs Lab 08/01/15 0610 08/04/15 1603  INR 1.14 1.18   Cardiac Enzymes:  Recent Labs Lab 08/01/15 0610  TROPONINI 0.03   BNP (last 3 results) No results for input(s): PROBNP in the last 8760 hours.  CBG:  Recent Labs Lab 08/01/15 1334 08/02/15 0840 08/02/15 1129  GLUCAP 90 81 78    Studies: No results found.   Scheduled Meds: . antiseptic oral rinse  7 mL Mouth Rinse BID  . cinacalcet  90 mg Oral BID WC  . darbepoetin (ARANESP) injection - DIALYSIS  200 mcg Intravenous Q Wed-HD  . doxercalciferol  2 mcg Intravenous Q M,W,F-HD  . folic acid  1 mg Intravenous Daily  . gabapentin  100 mg Oral BID  . piperacillin-tazobactam (ZOSYN)  IV  2.25 g Intravenous Q8H   Continuous Infusions:  PRN Meds: sodium chloride, HYDROmorphone (DILAUDID) injection, oxyCODONE-acetaminophen  Time spent: 30 minutes  Author: Berle Mull, MD [facility] Pager: [phone] 08/07/2015 4:59 PM  If 7PM-7AM, please contact night-coverage at [email or web address], password [facility]

## 2015-08-07 NOTE — Progress Notes (Signed)
[facility] Rounding Note  Subjective/Interval Events:   Colonic mass resected 6/23 Pathology is still pending Still no flatus or BM but diet advancing with liquids Off dilaudid - says pain controlled Had HD Saturday off schedule  Objective Vital signs in last 24 hours: Filed Vitals:   08/06/15 2334 08/07/15 0421 08/07/15 0434 08/07/15 0811  BP:   127/101 129/87  Pulse:   85 105  Temp:   98.4 F (36.9 C) 98.3 F (36.8 C)  TempSrc:   Oral Oral  Resp: 20 20 18 17   Height:      Weight:      SpO2: 99% 98% 100% 99%   Weight change:   Physical Exam: General: Pleasant,alert OX3, NAD Sitting up in the chair Regular [id number] No S3 1/6 systolic murmur Lungs clear Abd with  4 small laparotomy incisions plus a mid abd above the umbilicus w/waffle type dsg. All dry. + BS. No edema R AVF + bruit  Labs: Basic Metabolic Panel:  Recent Labs Lab 08/04/15 0521  08/06/15 0451 08/06/15 0725 08/07/15 0523  NA 138  < > 137 138 136  K 3.6  < > 4.0 3.9 4.4  CL 99*  < > 100* 101 99*  CO2 26  < > 23 22 25   GLUCOSE 87  < > 70 77 74  BUN 11  < > 19 19 10   CREATININE 6.37*  < > 10.20* 10.13* 6.55*  CALCIUM 7.9*  < > 7.6* 7.7* 7.6*  PHOS 4.7*  --   --  6.9* 4.2  < > = values in this interval not displayed.   Recent Labs Lab 08/01/15 0610  08/06/15 0451 08/06/15 0725 08/07/15 0523  AST 14*  --  13*  --   --   ALT 11*  --  9*  --   --   ALKPHOS 61  --  55  --   --   BILITOT 0.2*  --  0.5  --   --   PROT 6.0*  --  6.2*  --   --   ALBUMIN 2.2*  < > 2.0* 2.0* 2.3*  < > = values in this interval not displayed.  Recent Labs Lab 08/01/15 0610  LIPASE 124*     Recent Labs Lab 08/01/15 0610  08/04/15 0521 08/05/15 0414  08/05/15 1645 08/06/15 0451 08/07/15 0523  WBC 15.3*  < > 8.4 8.2  --  14.0* 9.8 8.8  NEUTROABS 12.2*  --   --   --   --   --  8.0*  --   HGB 7.6*  < > 8.0* 8.5*  < > 7.9* 7.9* 8.4*  HCT 24.6*  < > 25.0* 26.8*  < > 24.0* 24.9* 27.6*  MCV 89.5  < > 90.6 88.7  --   89.2 89.6 91.7  PLT 344  < > 270 308  --  286 298 335  < > = values in this interval not displayed.   Recent Labs Lab 08/01/15 0610  TROPONINI 0.03     Recent Labs Lab 08/01/15 1334 08/02/15 0840 08/02/15 1129  GLUCAP 90 81 78    Medications:   . antiseptic oral rinse  7 mL Mouth Rinse BID  . cinacalcet  90 mg Oral BID WC  . darbepoetin (ARANESP) injection - DIALYSIS  200 mcg Intravenous Q Wed-HD  . doxercalciferol  2 mcg Intravenous Q M,W,F-HD  . folic acid  1 mg Intravenous Daily  . gabapentin  100 mg Oral BID  .  piperacillin-tazobactam (ZOSYN)  IV  2.25 g Intravenous Q8H   OP Dialysis Orders:  [facility]: [facility]  4.5 hours  180  500 A 1.5  EDW 96.5  2K 2 Ca  right lower AVF  Hectorol 2  Heparin 5000 will now be discontinued indefinitely Mircera 50 q 2 last On 07/27/15  Recent OP  labs: hgb 10.0 On 6/14 21% sat ferritin 1476 iPTH 1242 P 5.6 Ca 8.6   Background 55 yo AAM with known diverticulosis, recent admission for cecal diverticulitis (6/8-6/10) readmitted with LGIB presumed diverticular in origin. Colonoscopy 6/22 showed an ulcerated partially obstructing mass in proximal ascending colon, near the ileocecal valve. Surgical resection 6/23.  Problem/Plan: 1. LGIB  - stable. Last transfused 6/19. (tics, hemorrhoids and mass on colonoscopy 6/22) 2. Ulcerated partially obstructing  large mass in prox ascending colon s/p laparoscopic colon resection  6/23. Adherent to ant abd wall and near perforated at time surgery. Suspicious for CA. Path is still pending (path from biopsies done during colonoscopy- inflammatory changes). Zosyn.  3. ESRD - HD MWF - no heparin indefinitely. HD todayyesterday off schedule d/t surg 6/23. Return to MWF tomorrow. No heparin indefinitely.  4. Anemia - ESRD + ABLA - Transfused 2 units 6/19 for Hb 6.8. Hb 8.5 preop -> 7.9 post op and today 8.4. stable so far.  Rec'd Aranesp 200 mcg 08/03/15 and to continue weekly.    5. Secondary hyperparathyroidism - PTH >1,000 . Hectorol with HD + sensipar 180 - resume. Restart binders when gi stable (phos good now on no binder/restricted diet) 6. Jehovah Witness but allows transfusion  Plan: HD Monday. F/U path from colinonic mass resection. No heparin with HD indefinitely. Diet advancing.    Jamal Maes, MD [facility] [phone] Pager 08/07/2015, 9:37 AM

## 2015-08-08 ENCOUNTER — Encounter (HOSPITAL_COMMUNITY): Payer: Self-pay | Admitting: General Surgery

## 2015-08-08 LAB — CBC
HCT: 25.2 % — ABNORMAL LOW (ref 39.0–52.0)
Hemoglobin: 8 g/dL — ABNORMAL LOW (ref 13.0–17.0)
MCH: 29.2 pg (ref 26.0–34.0)
MCHC: 31.7 g/dL (ref 30.0–36.0)
MCV: 92 fL (ref 78.0–100.0)
Platelets: 322 10*3/uL (ref 150–400)
RBC: 2.74 MIL/uL — ABNORMAL LOW (ref 4.22–5.81)
RDW: 17.1 % — ABNORMAL HIGH (ref 11.5–15.5)
WBC: 9.5 10*3/uL (ref 4.0–10.5)

## 2015-08-08 LAB — RENAL FUNCTION PANEL
Albumin: 2.1 g/dL — ABNORMAL LOW (ref 3.5–5.0)
Anion gap: 12 (ref 5–15)
BUN: 15 mg/dL (ref 6–20)
CO2: 24 mmol/L (ref 22–32)
Calcium: 7.3 mg/dL — ABNORMAL LOW (ref 8.9–10.3)
Chloride: 98 mmol/L — ABNORMAL LOW (ref 101–111)
Creatinine, Ser: 9.14 mg/dL — ABNORMAL HIGH (ref 0.61–1.24)
GFR calc Af Amer: 7 mL/min — ABNORMAL LOW (ref >60)
GFR calc non Af Amer: 6 mL/min — ABNORMAL LOW (ref >60)
Glucose, Bld: 79 mg/dL (ref 65–99)
Phosphorus: 5.4 mg/dL — ABNORMAL HIGH (ref 2.5–4.6)
Potassium: 4.3 mmol/L (ref 3.5–5.1)
Sodium: 134 mmol/L — ABNORMAL LOW (ref 135–145)

## 2015-08-08 MED ORDER — OXYCODONE-ACETAMINOPHEN 5-325 MG PO TABS
1.0000 | ORAL_TABLET | Freq: Four times a day (QID) | ORAL | Status: DC | PRN
Start: 2015-08-08 — End: 2015-08-10
  Administered 2015-08-08 – 2015-08-10 (×4): 1 via ORAL
  Filled 2015-08-08 (×4): qty 1

## 2015-08-08 MED ORDER — HYDROMORPHONE HCL 1 MG/ML IJ SOLN
INTRAMUSCULAR | Status: AC
  Filled 2015-08-08: qty 1

## 2015-08-08 MED ORDER — DOXERCALCIFEROL 4 MCG/2ML IV SOLN
INTRAVENOUS | Status: AC
  Administered 2015-08-08: 2 ug via INTRAVENOUS
  Filled 2015-08-08: qty 2

## 2015-08-08 MED ORDER — TRAMADOL HCL 50 MG PO TABS
50.0000 mg | ORAL_TABLET | Freq: Four times a day (QID) | ORAL | Status: DC | PRN
  Administered 2015-08-09: 50 mg via ORAL
  Filled 2015-08-08: qty 1

## 2015-08-08 MED ORDER — CALCIUM ACETATE (PHOS BINDER) 667 MG PO CAPS: 2001.0000 mg | ORAL_CAPSULE | Freq: Three times a day (TID) | ORAL | Status: DC

## 2015-08-08 MED ORDER — FOLIC ACID 1 MG PO TABS
1.0000 mg | ORAL_TABLET | Freq: Every day | ORAL | Status: DC
  Administered 2015-08-08 – 2015-08-10 (×3): 1 mg via ORAL
  Filled 2015-08-08 (×4): qty 1

## 2015-08-08 MED ORDER — CALCIUM ACETATE (PHOS BINDER) 667 MG PO CAPS
2001.0000 mg | ORAL_CAPSULE | Freq: Three times a day (TID) | ORAL | Status: DC
  Administered 2015-08-08 – 2015-08-09 (×3): 2001 mg via ORAL
  Filled 2015-08-08 (×5): qty 3

## 2015-08-08 NOTE — Progress Notes (Signed)
[facility] Rounding Note  Subjective: still no BM, walking in halls and eating, last dilaudid was 6/23 but is taking po narcotics  Objective Vital signs in last 24 hours: Filed Vitals:   08/08/15 1004 08/08/15 1030 08/08/15 1100 08/08/15 1130  BP: 100/62 146/84  133/88  Pulse: 83 78 71 84  Temp:      TempSrc:      Resp: 13 18 18 12   Height:      Weight:      SpO2:       Weight change: 2.098 kg (4 lb 10 oz)  Physical Exam: General: Pleasant,alert OX3, NAD Regular [id number] No S3 1/6 systolic murmur Lungs clear Abd incisions clean and dry, +bs No edema R AVF + bruit  Labs: Basic Metabolic Panel:  Recent Labs Lab 08/06/15 0725 08/07/15 0523 08/08/15 0712  NA 138 136 134*  K 3.9 4.4 4.3  CL 101 99* 98*  CO2 22 25 24   GLUCOSE 77 74 79  BUN 19 10 15   CREATININE 10.13* 6.55* 9.14*  CALCIUM 7.7* 7.6* 7.3*  PHOS 6.9* 4.2 5.4*     Recent Labs Lab 08/06/15 0451 08/06/15 0725 08/07/15 0523 08/08/15 0712  AST 13*  --   --   --   ALT 9*  --   --   --   ALKPHOS 55  --   --   --   BILITOT 0.5  --   --   --   PROT 6.2*  --   --   --   ALBUMIN 2.0* 2.0* 2.3* 2.1*   No results for input(s): LIPASE, AMYLASE in the last 168 hours.   Recent Labs Lab 08/05/15 0414  08/05/15 1645 08/06/15 0451 08/07/15 0523 08/08/15 0712  WBC 8.2  --  14.0* 9.8 8.8 9.5  NEUTROABS  --   --   --  8.0*  --   --   HGB 8.5*  < > 7.9* 7.9* 8.4* 8.0*  HCT 26.8*  < > 24.0* 24.9* 27.6* 25.2*  MCV 88.7  --  89.2 89.6 91.7 92.0  PLT 308  --  286 298 335 322  < > = values in this interval not displayed.  No results for input(s): CKTOTAL, CKMB, CKMBINDEX, TROPONINI in the last 168 hours.   Recent Labs Lab 08/01/15 1334 08/02/15 0840 08/02/15 1129  GLUCAP 90 81 78    Medications:   . antiseptic oral rinse  7 mL Mouth Rinse BID  . cinacalcet  90 mg Oral BID WC  . darbepoetin (ARANESP) injection - DIALYSIS  200 mcg Intravenous Q Wed-HD  . doxercalciferol  2 mcg Intravenous Q M,W,F-HD  .  folic acid  1 mg Oral Daily  . gabapentin  100 mg Oral BID  . piperacillin-tazobactam (ZOSYN)  IV  2.25 g Intravenous Q8H   OP Dialysis Orders:  [facility]  4.5 hours  180  500 A 1.5  EDW 96.5  2K 2 Ca  right lower AVF  Hectorol 2  Heparin 5000 will now be discontinued indefinitely Mircera 50 q 2 last On 07/27/15  Recent OP  labs: hgb 10.0 On 6/14 21% sat ferritin 1476 iPTH 1242 P 5.6 Ca 8.6   Background 55 yo AAM with known diverticulosis, recent admission for cecal diverticulitis (6/8-6/10) readmitted with LGIB presumed diverticular in origin. Colonoscopy 6/22 showed an ulcerated partially obstructing mass in proximal ascending colon, near the ileocecal valve. Surgical resection 6/23.  Problem/Plan: 1. LGIB  - resolved 2. S/P resection cecal  mass - path pending, still no BM 3. ESRD - HD MWF - no heparin for now. HD today 4. Anemia of CKD/ ABL - Hb 8, sp 2 prbc's and rec'd Aranesp 200 mcg 6/21 5. Secondary hyperparathyroidism - PTH >1,000 . Hectorol with HD + sensipar 180 - resume. Restart binders when gi stable (phos good now on no binder/restricted diet) 6. Jehovah Witness but allows transfusion  Plan: HD today, no hep. Have DC'd percocet due to lack of bowel activity.    Kelly Splinter MD [employer] pager [phone]    cell [phone] 08/08/2015, 11:57 AM

## 2015-08-08 NOTE — Progress Notes (Signed)
Patient ID: Carl Morrical Sr., male   DOB: 1960/05/08, 55 y.o.   MRN: [id number]     [facility]      [address] [place], [place] [address]    Phone: [phone] FAX: [phone]     Subjective: No n/v. Passing flatus.  Tolerating fulls. Wants food. Walking in hallways.    Objective:  Vital signs:  Filed Vitals:   08/08/15 1100 08/08/15 1130 08/08/15 1200 08/08/15 1209  BP: 139/87 133/88 146/89 140/89  Pulse: 71 84 82 88  Temp:    98.3 F (36.8 C)  TempSrc:    Oral  Resp: _0 Height:      Weight:    96.1 kg (211 lb 13.8 oz)  SpO2:        Last BM Date: 08/05/15  Intake/Output   Yesterday:  06/25 0701 - 06/26 0700 In: 44 [P.O.:820] Out: 0  This shift:  Total I/O In: 0  Out: 2700 [Other:2700]    Physical Exam: General: Pt awake/alert/oriented x4 in no acute distress  Abdomen: Soft. +BS. Nondistended.  Mildly tender at incisions only.  No evidence of peritonitis.  No incarcerated hernias.   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    Results:   Labs: Results for orders placed or performed during the hospital encounter of 08/01/15 (from the past 48 hour(s))  CBC     Status: Abnormal   Collection Time: 08/07/15  5:23 AM  Result Value Ref Range   WBC 8.8 4.0 - 10.5 K/uL   RBC 3.01 (L) 4.22 - 5.81 MIL/uL   Hemoglobin 8.4 (L) 13.0 - 17.0 g/dL   HCT 27.6 (L) 39.0 - 52.0 %   MCV 91.7 78.0 - 100.0 fL   MCH 27.9 26.0 - 34.0 pg   MCHC 30.4 30.0 - 36.0 g/dL   RDW 17.1 (H) 11.5 - 15.5 %   Platelets 335 150 - 400 K/uL  Renal function panel     Status: Abnormal   Collection Time: 08/07/15  5:23 AM  Result Value Ref Range   Sodium 136 135 - 145 mmol/L   Potassium 4.4 3.5 - 5.1 mmol/L   Chloride 99 (L) 101 - 111 mmol/L   CO2 25 22 - 32 mmol/L   Glucose, Bld 74 65 - 99 mg/dL   BUN 10 6 - 20 mg/dL   Creatinine, Ser 6.55 (H) 0.61 - 1.24 mg/dL    Comment: DELTA CHECK NOTED   Calcium 7.6 (L) 8.9 - 10.3 mg/dL   Phosphorus 4.2 2.5 - 4.6 mg/dL   Albumin 2.3 (L) 3.5 - 5.0 g/dL   GFR calc non Af Amer 8 (L) >60 mL/min   GFR calc Af Amer 10 (L) >60 mL/min    Comment: (NOTE) The eGFR has been calculated using the CKD EPI equation. This calculation has not been validated in all clinical situations. eGFR's persistently <60 mL/min signify possible Chronic Kidney Disease.    Anion gap 12 5 - 15  CBC     Status: Abnormal   Collection Time: 08/08/15  7:12 AM  Result Value Ref Range   WBC 9.5 4.0 - 10.5 K/uL   RBC 2.74 (L) 4.22 - 5.81  MIL/uL   Hemoglobin 8.0 (L) 13.0 - 17.0 g/dL   HCT 25.2 (L) 39.0 - 52.0 %   MCV 92.0 78.0 - 100.0 fL   MCH 29.2 26.0 - 34.0 pg   MCHC 31.7 30.0 - 36.0 g/dL   RDW 17.1 (H) 11.5 - 15.5 %   Platelets 322 150 - 400 K/uL  Renal function panel     Status: Abnormal   Collection Time: 08/08/15  7:12 AM  Result Value Ref Range   Sodium 134 (L) 135 - 145 mmol/L   Potassium 4.3 3.5 - 5.1 mmol/L   Chloride 98 (L) 101 - 111 mmol/L   CO2 24 22 - 32 mmol/L   Glucose, Bld 79 65 - 99 mg/dL   BUN 15 6 - 20 mg/dL   Creatinine, Ser 9.14 (H) 0.61 - 1.24 mg/dL   Calcium 7.3 (L) 8.9 - 10.3 mg/dL   Phosphorus 5.4 (H) 2.5 - 4.6 mg/dL   Albumin 2.1 (L) 3.5 - 5.0 g/dL   GFR calc non Af Amer 6 (L) >60 mL/min   GFR calc Af Amer 7 (L) >60 mL/min    Comment: (NOTE) The eGFR has been calculated using the CKD EPI equation. This calculation has not been validated in all clinical situations. eGFR's persistently <60 mL/min signify possible Chronic Kidney Disease.    Anion gap 12 5 - 15    Imaging / Studies: No results found.  Medications / Allergies:  Scheduled Meds: . antiseptic oral rinse  7 mL Mouth Rinse BID  . cinacalcet  90  mg Oral BID WC  . darbepoetin (ARANESP) injection - DIALYSIS  200 mcg Intravenous Q Wed-HD  . doxercalciferol  2 mcg Intravenous Q M,W,F-HD  . folic acid  1 mg Oral Daily  . gabapentin  100 mg Oral BID  . HYDROmorphone      . piperacillin-tazobactam (ZOSYN)  IV  2.25 g Intravenous Q8H   Continuous Infusions:  PRN Meds:.sodium chloride, HYDROmorphone (DILAUDID) injection  Antibiotics: Anti-infectives    Start     Dose/Rate Route Frequency Ordered Stop   08/04/15 2200  neomycin (MYCIFRADIN) tablet 1,000 mg  Status:  Discontinued     1,000 mg Oral 3 times per day 08/04/15 1802 08/05/15 1659   08/04/15 2200  metroNIDAZOLE (FLAGYL) tablet 1,000 mg  Status:  Discontinued     1,000 mg Oral 3 times per day 08/04/15 1802 08/05/15 1659   08/02/15 1600  piperacillin-tazobactam (ZOSYN) IVPB 2.25 g     2.25 g 100 mL/hr over 30 Minutes Intravenous Every 8 hours 08/02/15 1338     08/01/15 1600  piperacillin-tazobactam (ZOSYN) IVPB 2.25 g  Status:  Discontinued     2.25 g 100 mL/hr over 30 Minutes Intravenous Every 8 hours 08/01/15 0805 08/02/15 1338   08/01/15 0745  piperacillin-tazobactam (ZOSYN) IVPB 3.375 g     3.375 g 100 mL/hr over 30 Minutes Intravenous  Once 08/01/15 0736 08/01/15 0836        Assessment/Plan cecal mass POD#3 laparoscopic assisted right hemicolectomy---Dr. Barry Dienes -advance diet, mobilize, add PO pain meds FEN-soft diet VTE prophylaxis-scd, heparin okay Dispo-ileus   Erby Pian, ANP-BC [facility] Pager [phone](7A-4:30P) For consults and floor pages call [phone](7A-4:30P)  08/08/2015 1:21 PM

## 2015-08-08 NOTE — Progress Notes (Signed)
[facility] Progress Note  Patient: Carl Pasierb Sr. [id number]   PCP: Lucrezia Starch, MD DOB: 10/25/1960   DOA: 08/01/2015   DOS: 08/08/2015   Date of Service: the patient was seen and examined on 08/08/2015  Subjective: No acute event identified. No abdominal pain.  Nutrition: Tolerating diet  Brief hospital course: Pt. with PMH of ESRD on HD, OSA, HTN; admitted on 08/01/2015, with complaint of right lower quadrant pain and bleeding with passing out episode, was found to have BRBPR. CT scan was concerning for cecal thickening and underwent colonoscopy followed by colectomy. Currently further plan is continue monitoring course after colectomy  Assessment and Plan: 1. GI bleed Large ulcer causing obstruction at the ileocecal wall concerning for carcinoma  CT abdomen suggested cecal wall thickening concerning for diverticulitis. Colonoscopies revealing an ulcer concerning for carcinoma. Biopsies are taken. General surgery is consulted and patient underwent colectomy with anastomosis 08/05/2015. Surgery identified nearly perforated cecal mass which is currently sent for pathology. Currently bleeding resolved. Treated with IV Zosyn. Finish 7 day treatment course, last day 08/08/2015  2. ESRD on hemodialysis. Currently euvolemic. Appreciate nephrology consultation. Continuing Sensipar  3. Syncope. Likely from acute blood loss. No focal deficit no events on telemetry. Patient is ambulating without any assistance at present.  4. Acute blood loss anemia. Status post DDAVP 1, Feraheme 1. Refusing blood transfusion. Need to monitor for significant anemia.  Pain management: When necessary Tylenol Activity: Currently independent  Bowel regimen: last BM 08/05/2015 Diet: Soft diet DVT Prophylaxis: mechanical compression device  Advance goals of care discussion: Full code  Family Communication: no family was present at bedside, at the time of interview.   Disposition:    Discharge to likely home. Expected discharge date: 08/09/2015, postoperative recovery  Consultants: Gastroenterology, CCM primary admission, nephrology, general surgery Procedures: Colonoscopy, hemodialysis  Antibiotics: Anti-infectives    Start     Dose/Rate Route Frequency Ordered Stop   08/04/15 2200  neomycin (MYCIFRADIN) tablet 1,000 mg  Status:  Discontinued     1,000 mg Oral 3 times per day 08/04/15 1802 08/05/15 1659   08/04/15 2200  metroNIDAZOLE (FLAGYL) tablet 1,000 mg  Status:  Discontinued     1,000 mg Oral 3 times per day 08/04/15 1802 08/05/15 1659   08/02/15 1600  piperacillin-tazobactam (ZOSYN) IVPB 2.25 g     2.25 g 100 mL/hr over 30 Minutes Intravenous Every 8 hours 08/02/15 1338     08/01/15 1600  piperacillin-tazobactam (ZOSYN) IVPB 2.25 g  Status:  Discontinued     2.25 g 100 mL/hr over 30 Minutes Intravenous Every 8 hours 08/01/15 0805 08/02/15 1338   08/01/15 0745  piperacillin-tazobactam (ZOSYN) IVPB 3.375 g     3.375 g 100 mL/hr over 30 Minutes Intravenous  Once 08/01/15 0736 08/01/15 0836        Intake/Output Summary (Last 24 hours) at 08/08/15 2010 Last data filed at 08/08/15 1754  Gross per 24 hour  Intake    460 ml  Output   2700 ml  Net  -2240 ml   Filed Weights   08/07/15 2100 08/08/15 0705 08/08/15 1209  Weight: 100.699 kg (222 lb) 99.3 kg (218 lb 14.7 oz) 96.1 kg (211 lb 13.8 oz)    Objective: Physical Exam: Filed Vitals:   08/08/15 1200 08/08/15 1209 08/08/15 1302 08/08/15 1754  BP: 146/89 140/89 121/83 127/82  Pulse: 82 88 96 87  Temp:  98.3 F (36.8 C) 97.8 F (36.6 C) 98.6 F (37 C)  TempSrc:  Oral  Oral Oral  Resp: 16 18 19 18   Height:      Weight:  96.1 kg (211 lb 13.8 oz)    SpO2:   100% 100%    General: Alert, Awake and Oriented to Time, Place and Person. Appear in NO distress Eyes: PERRL, Conjunctiva normal ENT: Oral Mucosa clear moist. Neck: no JVD, no Abnormal Mass Or lumps Cardiovascular: S1 and S2 Present,  aortic systolic Murmur, Respiratory: Bilateral Air entry equal and Decreased, Clear to Auscultation, no Crackles, no wheezes Abdomen: Bowel Sound Sluggish, Soft and mild tenderness Skin: no redness, no Rash  Extremities: no Pedal edema, no calf tenderness Neurologic: Grossly no focal neuro deficit. Bilaterally Equal motor strength  Data Reviewed: CBC:  Recent Labs Lab 08/05/15 0414  08/05/15 1420 08/05/15 1645 08/06/15 0451 08/07/15 0523 08/08/15 0712  WBC 8.2  --   --  14.0* 9.8 8.8 9.5  NEUTROABS  --   --   --   --  8.0*  --   --   HGB 8.5*  < > 7.5* 7.9* 7.9* 8.4* 8.0*  HCT 26.8*  < > 22.0* 24.0* 24.9* 27.6* 25.2*  MCV 88.7  --   --  89.2 89.6 91.7 92.0  PLT 308  --   --  286 298 335 322  < > = values in this interval not displayed. Basic Metabolic Panel:  Recent Labs Lab 08/02/15 0730  08/04/15 0521  08/05/15 1645 08/06/15 0451 08/06/15 0725 08/07/15 0523 08/08/15 0712  NA 135  < > 138  < > 138 137 138 136 134*  K 3.6  < > 3.6  < > 3.4* 4.0 3.9 4.4 4.3  CL 98*  < > 99*  --  102 100* 101 99* 98*  CO2 28  < > 26  --  22 23 22 25 24   GLUCOSE 82  < > 87  < > 102* 70 77 74 79  BUN 17  < > 11  --  16 19 19 10 15   CREATININE 6.04*  < > 6.37*  --  8.93* 10.20* 10.13* 6.55* 9.14*  CALCIUM 7.6*  < > 7.9*  --  7.3* 7.6* 7.7* 7.6* 7.3*  MG 1.7  --  2.1  --   --   --   --   --   --   PHOS  --   --  4.7*  --   --   --  6.9* 4.2 5.4*  < > = values in this interval not displayed.  Liver Function Tests:  Recent Labs Lab 08/04/15 0521 08/06/15 0451 08/06/15 0725 08/07/15 0523 08/08/15 0712  AST  --  13*  --   --   --   ALT  --  9*  --   --   --   ALKPHOS  --  55  --   --   --   BILITOT  --  0.5  --   --   --   PROT  --  6.2*  --   --   --   ALBUMIN 2.2* 2.0* 2.0* 2.3* 2.1*   No results for input(s): LIPASE, AMYLASE in the last 168 hours. No results for input(s): AMMONIA in the last 168 hours. Coagulation Profile:  Recent Labs Lab 08/04/15 1603  INR 1.18    Cardiac Enzymes: No results for input(s): CKTOTAL, CKMB, CKMBINDEX, TROPONINI in the last 168 hours. BNP (last 3 results) No results for input(s): PROBNP in the last 8760 hours.  CBG:  Recent Labs Lab 08/02/15 0840 08/02/15 1129  GLUCAP 81 78    Studies: No results found.   Scheduled Meds: . antiseptic oral rinse  7 mL Mouth Rinse BID  . calcium acetate  2,001 mg Oral TID WC  . cinacalcet  90 mg Oral BID WC  . darbepoetin (ARANESP) injection - DIALYSIS  200 mcg Intravenous Q Wed-HD  . doxercalciferol  2 mcg Intravenous Q M,W,F-HD  . folic acid  1 mg Oral Daily  . gabapentin  100 mg Oral BID  . HYDROmorphone      . piperacillin-tazobactam (ZOSYN)  IV  2.25 g Intravenous Q8H   Continuous Infusions:  PRN Meds: sodium chloride, HYDROmorphone (DILAUDID) injection, oxyCODONE-acetaminophen, traMADol  Time spent: 30 minutes  Author: Berle Mull, MD [facility] Pager: [phone] 08/08/2015 8:10 PM  If 7PM-7AM, please contact night-coverage at [email or web address], password [facility]

## 2015-08-08 NOTE — Anesthesia Postprocedure Evaluation (Signed)
Anesthesia Post Note  Patient: Bento Gantert Sr.  Procedure(s) Performed: Procedure(s) (LRB): LAPAROSCOPIC RIGHT COLECTOMY- ASCENDING (N/A)  Patient location during evaluation: PACU Anesthesia Type: General Level of consciousness: awake and alert Pain management: pain level controlled Vital Signs Assessment: post-procedure vital signs reviewed and stable Respiratory status: spontaneous breathing, nonlabored ventilation and respiratory function stable Cardiovascular status: blood pressure returned to baseline and stable Postop Assessment: no signs of nausea or vomiting Anesthetic complications: no    Last Vitals:  Filed Vitals:   08/08/15 1302 08/08/15 1754  BP: 121/83 127/82  Pulse: 96 87  Temp: 36.6 C 37 C  Resp: 19 18    Last Pain:  Filed Vitals:   08/08/15 1754  PainSc: 0-No pain                 Asaad Gulley,W. EDMOND

## 2015-08-08 NOTE — Progress Notes (Signed)
Called into the patient 's room.He me showed a swollen,slight tendered area on his mid-abdomen.Patient showed me this area two hours earlier but this one is getting bigger in size.Patient not complaining of pain on the site,no vomiting nor nausea.Advised patient to not to eat nor drink until the surgeon would see her tonight.Surgeon on call made aware and will see patient tonight.

## 2015-08-09 ENCOUNTER — Inpatient Hospital Stay (HOSPITAL_COMMUNITY): Payer: Medicare Other

## 2015-08-09 LAB — TYPE AND SCREEN
ABO/RH(D): O POS
Antibody Screen: NEGATIVE
Unit division: 0
Unit division: 0
Unit division: 0
Unit division: 0

## 2015-08-09 MED ORDER — LIDOCAINE-PRILOCAINE 2.5-2.5 % EX CREA: 1.0000 "application " | TOPICAL_CREAM | CUTANEOUS | Status: DC | PRN

## 2015-08-09 MED ORDER — ALTEPLASE 2 MG IJ SOLR: 2.0000 mg | Freq: Once | INTRAMUSCULAR | Status: DC | PRN

## 2015-08-09 MED ORDER — DIATRIZOATE MEGLUMINE & SODIUM 66-10 % PO SOLN
15.0000 mL | ORAL | Status: AC
Start: 2015-08-09 — End: 2015-08-09
  Administered 2015-08-09 (×2): 15 mL via ORAL
  Filled 2015-08-09: qty 30

## 2015-08-09 MED ORDER — SODIUM CHLORIDE 0.9 % IV SOLN: 100.0000 mL | INTRAVENOUS | Status: DC | PRN

## 2015-08-09 MED ORDER — DOCUSATE SODIUM 100 MG PO CAPS
100.0000 mg | ORAL_CAPSULE | Freq: Two times a day (BID) | ORAL | Status: DC
  Administered 2015-08-09 – 2015-08-10 (×3): 100 mg via ORAL
  Filled 2015-08-09 (×3): qty 1

## 2015-08-09 MED ORDER — POLYETHYLENE GLYCOL 3350 17 G PO PACK
17.0000 g | PACK | Freq: Every day | ORAL | Status: DC
  Administered 2015-08-09 – 2015-08-10 (×2): 17 g via ORAL
  Filled 2015-08-09 (×2): qty 1

## 2015-08-09 MED ORDER — HEPARIN SODIUM (PORCINE) 1000 UNIT/ML DIALYSIS: 1000.0000 [IU] | INTRAMUSCULAR | Status: DC | PRN

## 2015-08-09 MED ORDER — PENTAFLUOROPROP-TETRAFLUOROETH EX AERO: 1.0000 "application " | INHALATION_SPRAY | CUTANEOUS | Status: DC | PRN

## 2015-08-09 MED ORDER — LIDOCAINE HCL (PF) 1 % IJ SOLN: 5.0000 mL | INTRAMUSCULAR | Status: DC | PRN

## 2015-08-09 MED ORDER — SODIUM CHLORIDE 0.9 % IV SOLN
100.0000 mL | INTRAVENOUS | Status: DC | PRN
Start: 2015-08-09 — End: 2015-08-10

## 2015-08-09 NOTE — Progress Notes (Signed)
Patient ID: Carl Hinton Sr., male   DOB: January 07, 1961, 55 y.o.   MRN: [id number]     [facility]      [address] [place], [place] [address]    Phone: [phone] FAX: [phone]     Subjective: Felt pain and a "bulge" after eating solids yesterday. No n/v. Small bm yesterday.   Objective:  Vital signs:  Filed Vitals:   08/08/15 1754 08/08/15 2140 08/09/15 0515 08/09/15 0905  BP: 127/82 148/102 127/83 118/81  Pulse: 87 107 94 96  Temp: 98.6 F (37 C) 98.5 F (36.9 C) 98.5 F (36.9 C) 98.4 F (36.9 C)  TempSrc: Oral Oral Oral Oral  Resp: '18 20 18 18  ' Height:      Weight:  96.707 kg (213 lb 3.2 oz)    SpO2: 100% 100% 100% 100%    Last BM Date: 08/09/15  Intake/Output   Yesterday:  06/26 0701 - 06/27 0700 In: 360 [P.O.:360] Out: 2700  This shift:  Total I/O In: 240 [P.O.:240] Out: 0    Physical Exam: General: Pt awake/alert/oriented x4 in no acute distress  Abdomen: Soft.  Nondistended.  Mildly tender at incisions only.  No bulge or palpable hernia. No evidence of peritonitis.  No incarcerated hernias.    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    Results:   Labs: Results for orders placed or performed during the hospital encounter of 08/01/15 (from the past 48 hour(s))  CBC     Status: Abnormal   Collection Time: 08/08/15  7:12 AM  Result Value Ref Range   WBC 9.5 4.0 - 10.5 K/uL   RBC 2.74 (L) 4.22 - 5.81 MIL/uL   Hemoglobin 8.0 (L) 13.0 - 17.0 g/dL   HCT 25.2 (L) 39.0 - 52.0 %   MCV 92.0 78.0 - 100.0 fL   MCH 29.2 26.0 - 34.0 pg   MCHC 31.7 30.0 - 36.0 g/dL   RDW 17.1 (H) 11.5 - 15.5 %   Platelets 322 150 - 400 K/uL  Renal function panel     Status:  Abnormal   Collection Time: 08/08/15  7:12 AM  Result Value Ref Range   Sodium 134 (L) 135 - 145 mmol/L   Potassium 4.3 3.5 - 5.1 mmol/L   Chloride 98 (L) 101 - 111 mmol/L   CO2 24 22 - 32 mmol/L   Glucose, Bld 79 65 - 99 mg/dL   BUN 15 6 - 20 mg/dL   Creatinine, Ser 9.14 (H) 0.61 - 1.24 mg/dL   Calcium 7.3 (L) 8.9 - 10.3 mg/dL   Phosphorus 5.4 (H) 2.5 - 4.6 mg/dL   Albumin 2.1 (L) 3.5 - 5.0 g/dL   GFR calc non Af Amer 6 (L) >60 mL/min   GFR calc Af Amer 7 (L) >60 mL/min    Comment: (NOTE) The eGFR has been calculated using the CKD EPI equation. This calculation has not been validated in all clinical situations. eGFR's persistently <60 mL/min signify possible Chronic Kidney Disease.    Anion gap 12 5 - 15    Imaging / Studies: No results found.  Medications / Allergies:  Scheduled Meds: . antiseptic oral rinse  7 mL Mouth Rinse BID  .  calcium acetate  2,001 mg Oral TID WC  . cinacalcet  90 mg Oral BID WC  . darbepoetin (ARANESP) injection - DIALYSIS  200 mcg Intravenous Q Wed-HD  . diatrizoate meglumine-sodium  15 mL Oral Q1 Hr x 2  . docusate sodium  100 mg Oral BID  . doxercalciferol  2 mcg Intravenous Q M,W,F-HD  . folic acid  1 mg Oral Daily  . gabapentin  100 mg Oral BID  . piperacillin-tazobactam (ZOSYN)  IV  2.25 g Intravenous Q8H  . polyethylene glycol  17 g Oral Daily   Continuous Infusions:  PRN Meds:.sodium chloride, HYDROmorphone (DILAUDID) injection, oxyCODONE-acetaminophen, traMADol  Antibiotics: Anti-infectives    Start     Dose/Rate Route Frequency Ordered Stop   08/04/15 2200  neomycin (MYCIFRADIN) tablet 1,000 mg  Status:  Discontinued     1,000 mg Oral 3 times per day 08/04/15 1802 08/05/15 1659   08/04/15 2200  metroNIDAZOLE (FLAGYL) tablet 1,000 mg  Status:  Discontinued     1,000 mg Oral 3 times per day 08/04/15 1802 08/05/15 1659   08/02/15 1600  piperacillin-tazobactam (ZOSYN) IVPB 2.25 g     2.25 g 100 mL/hr over 30 Minutes Intravenous  Every 8 hours 08/02/15 1338     08/01/15 1600  piperacillin-tazobactam (ZOSYN) IVPB 2.25 g  Status:  Discontinued     2.25 g 100 mL/hr over 30 Minutes Intravenous Every 8 hours 08/01/15 0805 08/02/15 1338   08/01/15 0745  piperacillin-tazobactam (ZOSYN) IVPB 3.375 g     3.375 g 100 mL/hr over 30 Minutes Intravenous  Once 08/01/15 0736 08/01/15 0836       Assessment/Plan cecal mass POD#4 laparoscopic assisted right hemicolectomy---Dr. Barry Dienes -tolerating POs, mobilizing, wean to PO pain meds -did not appreciate hernia on exam, will await CT.  FEN-add colace/miralax.  VTE prophylaxis-scd, heparin okay Dispo-CT.  Anticipate DC in AM  Erby Pian, [facility] Pager [phone]) For consults and floor pages call [phone](7A-4:30P)  08/09/2015 10:27 AM

## 2015-08-09 NOTE — Progress Notes (Signed)
[facility] Progress Note  Patient: Carl Hollenbach Sr. [id number]   PCP: Lucrezia Starch, MD DOB: 1960/07/01   DOA: 08/01/2015   DOS: 08/09/2015   Date of Service: the patient was seen and examined on 08/09/2015  Subjective: Patient has a bowel movement. No nausea no vomiting. Tolerating oral diet.  Nutrition: Tolerating diet  Brief hospital course: Pt. with PMH of ESRD on HD, OSA, HTN; admitted on 08/01/2015, with complaint of right lower quadrant pain and bleeding with passing out episode, was found to have BRBPR. CT scan was concerning for cecal thickening and underwent colonoscopy followed by colectomy. Currently further plan is continue monitoring course after colectomy  Assessment and Plan: 1. GI bleed Large ulcer causing obstruction at the ileocecal wall  CT abdomen suggested cecal wall thickening concerning for diverticulitis. Colonoscopies revealing an ulcer concerning for carcinoma. Biopsies are taken. General surgery is consulted and patient underwent colectomy with anastomosis 08/05/2015. Surgery identified nearly perforated cecal mass, biopsy shows no evidence of malignancy. Currently bleeding resolved. Treated with IV Zosyn. Finish 7 day treatment course, last day 08/08/2015  2. ESRD on hemodialysis. Currently euvolemic. Appreciate nephrology consultation. Continuing Sensipar  3. Syncope. Likely from acute blood loss. No focal deficit no events on telemetry. Patient is ambulating without any assistance at present.  4. Acute blood loss anemia. Status post DDAVP 1, Feraheme 1. Refusing blood transfusion. Need to monitor for significant anemia.  5. Postoperative ileus. CT abdomen and pelvis does not show any evidence of acute abnormality other than postoperative ileus. We will monitor for recommendation of general surgery.  Pain management: When necessary Tylenol Activity: Currently independent  Bowel regimen: last BM 08/05/2015 Diet: Soft diet DVT  Prophylaxis: mechanical compression device  Advance goals of care discussion: Full code  Family Communication: no family was present at bedside, at the time of interview.   Disposition:  Discharge to likely home. Expected discharge date: 08/10/2015, postoperative recovery  Consultants: Gastroenterology, CCM primary admission, nephrology, general surgery Procedures: Colonoscopy, hemodialysis  Antibiotics: Anti-infectives    Start     Dose/Rate Route Frequency Ordered Stop   08/04/15 2200  neomycin (MYCIFRADIN) tablet 1,000 mg  Status:  Discontinued     1,000 mg Oral 3 times per day 08/04/15 1802 08/05/15 1659   08/04/15 2200  metroNIDAZOLE (FLAGYL) tablet 1,000 mg  Status:  Discontinued     1,000 mg Oral 3 times per day 08/04/15 1802 08/05/15 1659   08/02/15 1600  piperacillin-tazobactam (ZOSYN) IVPB 2.25 g     2.25 g 100 mL/hr over 30 Minutes Intravenous Every 8 hours 08/02/15 1338     08/01/15 1600  piperacillin-tazobactam (ZOSYN) IVPB 2.25 g  Status:  Discontinued     2.25 g 100 mL/hr over 30 Minutes Intravenous Every 8 hours 08/01/15 0805 08/02/15 1338   08/01/15 0745  piperacillin-tazobactam (ZOSYN) IVPB 3.375 g     3.375 g 100 mL/hr over 30 Minutes Intravenous  Once 08/01/15 0736 08/01/15 0836        Intake/Output Summary (Last 24 hours) at 08/09/15 1626 Last data filed at 08/09/15 1511  Gross per 24 hour  Intake    600 ml  Output      0 ml  Net    600 ml   Filed Weights   08/08/15 0705 08/08/15 1209 08/08/15 2140  Weight: 99.3 kg (218 lb 14.7 oz) 96.1 kg (211 lb 13.8 oz) 96.707 kg (213 lb 3.2 oz)    Objective: Physical Exam: Filed Vitals:   08/08/15 1754 08/08/15  2140 08/09/15 0515 08/09/15 0905  BP: 127/82 148/102 127/83 118/81  Pulse: 87 107 94 96  Temp: 98.6 F (37 C) 98.5 F (36.9 C) 98.5 F (36.9 C) 98.4 F (36.9 C)  TempSrc: Oral Oral Oral Oral  Resp: 18 20 18 18   Height:      Weight:  96.707 kg (213 lb 3.2 oz)    SpO2: 100% 100% 100% 100%    General: Alert, Awake and Oriented to Time, Place and Person. Appear in NO distress Eyes: PERRL, Conjunctiva normal ENT: Oral Mucosa clear moist. Neck: no JVD, no Abnormal Mass Or lumps Cardiovascular: S1 and S2 Present, aortic systolic Murmur, Respiratory: Bilateral Air entry equal and Decreased, Clear to Auscultation, no Crackles, no wheezes Abdomen: Bowel Sound Sluggish, Soft and mild tenderness Skin: no redness, no Rash  Extremities: no Pedal edema, no calf tenderness Neurologic: Grossly no focal neuro deficit. Bilaterally Equal motor strength  Data Reviewed: CBC:  Recent Labs Lab 08/05/15 0414  08/05/15 1420 08/05/15 1645 08/06/15 0451 08/07/15 0523 08/08/15 0712  WBC 8.2  --   --  14.0* 9.8 8.8 9.5  NEUTROABS  --   --   --   --  8.0*  --   --   HGB 8.5*  < > 7.5* 7.9* 7.9* 8.4* 8.0*  HCT 26.8*  < > 22.0* 24.0* 24.9* 27.6* 25.2*  MCV 88.7  --   --  89.2 89.6 91.7 92.0  PLT 308  --   --  286 298 335 322  < > = values in this interval not displayed. Basic Metabolic Panel:  Recent Labs Lab 08/04/15 0521  08/05/15 1645 08/06/15 0451 08/06/15 0725 08/07/15 0523 08/08/15 0712  NA 138  < > 138 137 138 136 134*  K 3.6  < > 3.4* 4.0 3.9 4.4 4.3  CL 99*  --  102 100* 101 99* 98*  CO2 26  --  22 23 22 25 24   GLUCOSE 87  < > 102* 70 77 74 79  BUN 11  --  16 19 19 10 15   CREATININE 6.37*  --  8.93* 10.20* 10.13* 6.55* 9.14*  CALCIUM 7.9*  --  7.3* 7.6* 7.7* 7.6* 7.3*  MG 2.1  --   --   --   --   --   --   PHOS 4.7*  --   --   --  6.9* 4.2 5.4*  < > = values in this interval not displayed.  Liver Function Tests:  Recent Labs Lab 08/04/15 0521 08/06/15 0451 08/06/15 0725 08/07/15 0523 08/08/15 0712  AST  --  13*  --   --   --   ALT  --  9*  --   --   --   ALKPHOS  --  55  --   --   --   BILITOT  --  0.5  --   --   --   PROT  --  6.2*  --   --   --   ALBUMIN 2.2* 2.0* 2.0* 2.3* 2.1*   No results for input(s): LIPASE, AMYLASE in the last 168 hours. No  results for input(s): AMMONIA in the last 168 hours. Coagulation Profile:  Recent Labs Lab 08/04/15 1603  INR 1.18   Cardiac Enzymes: No results for input(s): CKTOTAL, CKMB, CKMBINDEX, TROPONINI in the last 168 hours. BNP (last 3 results) No results for input(s): PROBNP in the last 8760 hours.  CBG: No results for input(s): GLUCAP in the last  168 hours.  Studies: Ct Abdomen Pelvis Wo Contrast  08/09/2015  CLINICAL DATA:  Postop pain , status post right colectomy 08/05/2015 EXAM: CT ABDOMEN AND PELVIS WITHOUT CONTRAST TECHNIQUE: Multidetector CT imaging of the abdomen and pelvis was performed following the standard protocol without IV contrast. COMPARISON:  CT scan 08/01/2015 FINDINGS: Lower chest: Tiny pleural based nodule in right middle lobe measures 3 mm stable in size in appearance from prior exam. Atelectasis noted left base posteriorly. Hepatobiliary: Unenhanced liver is unremarkable. Pancreas: Unenhanced pancreas is unremarkable. Spleen: Unenhanced spleen is unremarkable. Adrenals/Urinary Tract: No adrenal gland mass. Unenhanced kidneys shows multiple renal cysts bilaterally probable polycystic kidney disease. Punctate calcifications left kidney are stable from prior exam. No hydronephrosis or hydroureter. No calcified ureteral calculi. Nonspecific mild thickening of urinary bladder wall. Stomach/Bowel: Diffuse mild small bowel distension probable mild postop ileus without evidence of transition point in caliber of small bowel. The patient is status post right colectomy. The entero colonic CT nostril Moses in proximal transverse colon axial image 41 appears patent. There is no evidence of stricture. Some fluid and gas noted in proximal transverse colon. Moderate gas noted in splenic flexure of the colon. No distal colitis or obstruction. Vascular/Lymphatic: No retroperitoneal or mesenteric adenopathy. Reproductive: No pelvic mass is noted. Prostate gland and seminal vesicles are  unremarkable. Other: There is free air in upper abdomen anterior to the liver most likely postsurgical in nature. Musculoskeletal: No destructive bony lesions are noted. Sagittal images of the spine shows mild degenerative changes lower thoracic and lumbar spine. Again noted mild sclerotic changes probable renal osteodystrophy. Schmorl's node deformity upper endplate of L4 and L5 vertebral body. Degenerative cystic changes noted bilateral proximal femur. IMPRESSION: 1. Status post right colectomy. The entero colonic anastomosis in right hepatic flexure appears patent. 2. Diffuse mild small bowel distension probable postop ileus without evidence of transition point in caliber of small bowel. No contrast extravasation. 3. No hydronephrosis or hydroureter. Again noted multiple bilateral renal cyst probable polycystic kidney disease. Left nonobstructive nephrolithiasis. 4. No distal colonic obstruction.  No distal colitis. 5. Free intraperitoneal air most likely postsurgical in nature. Clinical correlation is necessary. Electronically Signed   By: Lahoma Crocker M.D.   On: 08/09/2015 15:23     Scheduled Meds: . antiseptic oral rinse  7 mL Mouth Rinse BID  . calcium acetate  2,001 mg Oral TID WC  . cinacalcet  90 mg Oral BID WC  . darbepoetin (ARANESP) injection - DIALYSIS  200 mcg Intravenous Q Wed-HD  . docusate sodium  100 mg Oral BID  . doxercalciferol  2 mcg Intravenous Q M,W,F-HD  . folic acid  1 mg Oral Daily  . gabapentin  100 mg Oral BID  . piperacillin-tazobactam (ZOSYN)  IV  2.25 g Intravenous Q8H  . polyethylene glycol  17 g Oral Daily   Continuous Infusions:  PRN Meds: sodium chloride, HYDROmorphone (DILAUDID) injection, oxyCODONE-acetaminophen, traMADol  Time spent: 30 minutes  Author: Berle Mull, MD [facility] Pager: [phone] 08/09/2015 4:26 PM  If 7PM-7AM, please contact night-coverage at [email or web address], password [facility]

## 2015-08-09 NOTE — Care Management Important Message (Signed)
Important Message  Patient Details  Name: Carl Mujica Sr. MRN: [id number] Date of Birth: 04-10-1960   Medicare Important Message Given:  Yes    Loann Quill 08/09/2015, 8:19 AM

## 2015-08-09 NOTE — Progress Notes (Signed)
[facility] Rounding Note  Subjective: still no BM, for abd CT today, up in chair, no distress  Objective Vital signs in last 24 hours: Filed Vitals:   08/08/15 1754 08/08/15 2140 08/09/15 0515 08/09/15 0905  BP: 127/82 148/102 127/83 118/81  Pulse: 87 107 94 96  Temp: 98.6 F (37 C) 98.5 F (36.9 C) 98.5 F (36.9 C) 98.4 F (36.9 C)  TempSrc: Oral Oral Oral Oral  Resp: 18 20 18 18   Height:      Weight:  96.707 kg (213 lb 3.2 oz)    SpO2: 100% 100% 100% 100%   Weight change: -1.398 kg (-3 lb 1.3 oz)  Physical Exam: General: Pleasant,alert OX3, NAD Regular [id number] No S3 1/6 systolic murmur Lungs clear Abd incisions clean and dry, +bs No edema R AVF + bruit  Dialysis:  [facility] MWF  4.5h  500/ 1.5  2/2 bath  96.5kg  RFA AVF   Hep 5000 (on hold postop) Hect 2ug Mircera 50 q 2 due 6.28  Background 55 yo AAM with known diverticulosis, recent admission for cecal diverticulitis (6/8-6/10) readmitted with LGIB presumed diverticular in origin. Colonoscopy 6/22 showed an ulcerated partially obstructing mass in proximal ascending colon, near the ileocecal valve. Surgical resection 6/23.  Problem/Plan: 1. LGIB  - resolved 2. S/P resection cecal mass - path benign perforation, unclear cause, no evid malignancy 3. ESRD - HD MWF - no heparin for now. HD Wed 4. Anemia of CKD/ ABL - Hb 8, sp 2 prbc's and rec'd Aranesp 200 mcg 6/21 5. Secondary hyperparathyroidism - PTH >1,000 . Hectorol with HD + sensipar 180 - resume. Restart binders when gi stable (phos good now on no binder/restricted diet) 6. Jehovah Witness but allows transfusion  Plan: HD Wed in am   Kelly Splinter MD [facility] pager [phone]    cell [phone] 08/09/2015, 11:52 AM  Labs: Basic Metabolic Panel:  Recent Labs Lab 08/06/15 0725 08/07/15 0523 08/08/15 0712  NA 138 136 134*  K 3.9 4.4 4.3  CL 101 99* 98*  CO2 22 25 24   GLUCOSE 77 74 79  BUN 19 10 15   CREATININE 10.13* 6.55* 9.14*  CALCIUM 7.7*  7.6* 7.3*  PHOS 6.9* 4.2 5.4*     Recent Labs Lab 08/06/15 0451 08/06/15 0725 08/07/15 0523 08/08/15 0712  AST 13*  --   --   --   ALT 9*  --   --   --   ALKPHOS 55  --   --   --   BILITOT 0.5  --   --   --   PROT 6.2*  --   --   --   ALBUMIN 2.0* 2.0* 2.3* 2.1*   No results for input(s): LIPASE, AMYLASE in the last 168 hours.   Recent Labs Lab 08/05/15 0414  08/05/15 1645 08/06/15 0451 08/07/15 0523 08/08/15 0712  WBC 8.2  --  14.0* 9.8 8.8 9.5  NEUTROABS  --   --   --  8.0*  --   --   HGB 8.5*  < > 7.9* 7.9* 8.4* 8.0*  HCT 26.8*  < > 24.0* 24.9* 27.6* 25.2*  MCV 88.7  --  89.2 89.6 91.7 92.0  PLT 308  --  286 298 335 322  < > = values in this interval not displayed.  No results for input(s): CKTOTAL, CKMB, CKMBINDEX, TROPONINI in the last 168 hours.  No results for input(s): GLUCAP in the last 168 hours.  Medications:   . antiseptic  oral rinse  7 mL Mouth Rinse BID  . calcium acetate  2,001 mg Oral TID WC  . cinacalcet  90 mg Oral BID WC  . darbepoetin (ARANESP) injection - DIALYSIS  200 mcg Intravenous Q Wed-HD  . diatrizoate meglumine-sodium  15 mL Oral Q1 Hr x 2  . docusate sodium  100 mg Oral BID  . doxercalciferol  2 mcg Intravenous Q M,W,F-HD  . folic acid  1 mg Oral Daily  . gabapentin  100 mg Oral BID  . piperacillin-tazobactam (ZOSYN)  IV  2.25 g Intravenous Q8H  . polyethylene glycol  17 g Oral Daily

## 2015-08-09 NOTE — Progress Notes (Signed)
Patient concerned for abdominal swelling. Superior aspect of wound more distended than other parts of body. More tender on exam. No color changes. -will continue to follow

## 2015-08-10 LAB — RENAL FUNCTION PANEL
Albumin: 2.1 g/dL — ABNORMAL LOW (ref 3.5–5.0)
Anion gap: 15 (ref 5–15)
BUN: 13 mg/dL (ref 6–20)
CO2: 25 mmol/L (ref 22–32)
Calcium: 7.9 mg/dL — ABNORMAL LOW (ref 8.9–10.3)
Chloride: 95 mmol/L — ABNORMAL LOW (ref 101–111)
Creatinine, Ser: 8.37 mg/dL — ABNORMAL HIGH (ref 0.61–1.24)
GFR calc Af Amer: 7 mL/min — ABNORMAL LOW (ref >60)
GFR calc non Af Amer: 6 mL/min — ABNORMAL LOW (ref >60)
Glucose, Bld: 80 mg/dL (ref 65–99)
Phosphorus: 5.5 mg/dL — ABNORMAL HIGH (ref 2.5–4.6)
Potassium: 3.9 mmol/L (ref 3.5–5.1)
Sodium: 135 mmol/L (ref 135–145)

## 2015-08-10 LAB — CBC
HCT: 25.7 % — ABNORMAL LOW (ref 39.0–52.0)
Hemoglobin: 8.3 g/dL — ABNORMAL LOW (ref 13.0–17.0)
MCH: 29.3 pg (ref 26.0–34.0)
MCHC: 32.3 g/dL (ref 30.0–36.0)
MCV: 90.8 fL (ref 78.0–100.0)
Platelets: 312 10*3/uL (ref 150–400)
RBC: 2.83 MIL/uL — ABNORMAL LOW (ref 4.22–5.81)
RDW: 17.4 % — ABNORMAL HIGH (ref 11.5–15.5)
WBC: 8 10*3/uL (ref 4.0–10.5)

## 2015-08-10 MED ORDER — CINACALCET HCL 90 MG PO TABS: 90.0000 mg | ORAL_TABLET | Freq: Two times a day (BID) | ORAL | Status: DC

## 2015-08-10 MED ORDER — OXYCODONE-ACETAMINOPHEN 5-325 MG PO TABS: 1.0000 | ORAL_TABLET | Freq: Three times a day (TID) | ORAL | Status: DC | PRN

## 2015-08-10 MED ORDER — DOXERCALCIFEROL 4 MCG/2ML IV SOLN
INTRAVENOUS | Status: AC
  Filled 2015-08-10: qty 2

## 2015-08-10 MED ORDER — DOCUSATE SODIUM 100 MG PO CAPS: 100.0000 mg | ORAL_CAPSULE | Freq: Two times a day (BID) | ORAL | Status: DC | PRN

## 2015-08-10 NOTE — Progress Notes (Signed)
Patient ID: Carl Dieudonne Sr., male   DOB: [birth year], 55 y.o.   MRN: [id number]     [facility]      [address] [place], [place] [address]    Phone: [phone] FAX: [phone]     Subjective:  Pt seen in HD. Tolerating POs, ambulating, pain controlled.  CT without hernia or abscess, some free air, but residual from surgery.   Objective:  Vital signs:  Filed Vitals:   08/10/15 0730 08/10/15 0800 08/10/15 0830 08/10/15 0900  BP: 126/90 126/89 106/76 109/77  Pulse: 83 96 99 103  Temp:      TempSrc:      Resp:      Height:      Weight:      SpO2:        Last BM Date: 08/08/15  Intake/Output   Yesterday:  06/27 0701 - 06/28 0700 In: 900 [P.O.:900] Out: 0  This shift:    Physical Exam: General: Pt awake/alert/oriented x4 in no acute distress  Abdomen: Soft. Nondistended. Mildly tender at incisions only. No bulge or palpable hernia. No evidence of peritonitis. No incarcerated hernias.   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    Results:   Labs: Results for orders placed or performed during the hospital encounter of 08/01/15 (from the past 48 hour(s))  CBC     Status: Abnormal   Collection Time: 08/10/15  7:10 AM  Result Value Ref Range   WBC 8.0 4.0 - 10.5 K/uL   RBC 2.83 (L) 4.22 - 5.81 MIL/uL   Hemoglobin 8.3 (L) 13.0 - 17.0 g/dL   HCT 25.7 (L) 39.0 - 52.0 %   MCV 90.8 78.0 - 100.0 fL   MCH 29.3 26.0 - 34.0 pg   MCHC 32.3 30.0 - 36.0 g/dL   RDW 17.4 (H) 11.5 - 15.5 %   Platelets 312 150 - 400 K/uL  Renal function panel     Status: Abnormal   Collection Time: 08/10/15  7:10 AM  Result Value Ref Range   Sodium 135 135 - 145 mmol/L   Potassium 3.9 3.5 -  5.1 mmol/L   Chloride 95 (L) 101 - 111 mmol/L   CO2 25 22 - 32 mmol/L   Glucose, Bld 80 65 - 99 mg/dL   BUN 13 6 - 20 mg/dL   Creatinine, Ser 8.37 (H) 0.61 - 1.24 mg/dL   Calcium 7.9 (L) 8.9 - 10.3 mg/dL   Phosphorus 5.5 (H) 2.5 - 4.6 mg/dL   Albumin 2.1 (L) 3.5 - 5.0 g/dL   GFR calc non Af Amer 6 (L) >60 mL/min   GFR calc Af Amer 7 (L) >60 mL/min    Comment: (NOTE) The eGFR has been calculated using the CKD EPI equation. This calculation has not been validated in all clinical situations. eGFR's persistently <60 mL/min signify possible Chronic Kidney Disease.    Anion gap 15 5 - 15    Imaging / Studies: Ct Abdomen Pelvis Wo Contrast  08/09/2015  CLINICAL DATA:  Postop pain , status post right colectomy 08/05/2015 EXAM: CT ABDOMEN AND PELVIS WITHOUT CONTRAST TECHNIQUE: Multidetector CT imaging of the abdomen and pelvis was performed following the standard protocol without  IV contrast. COMPARISON:  CT scan 08/01/2015 FINDINGS: Lower chest: Tiny pleural based nodule in right middle lobe measures 3 mm stable in size in appearance from prior exam. Atelectasis noted left base posteriorly. Hepatobiliary: Unenhanced liver is unremarkable. Pancreas: Unenhanced pancreas is unremarkable. Spleen: Unenhanced spleen is unremarkable. Adrenals/Urinary Tract: No adrenal gland mass. Unenhanced kidneys shows multiple renal cysts bilaterally probable polycystic kidney disease. Punctate calcifications left kidney are stable from prior exam. No hydronephrosis or hydroureter. No calcified ureteral calculi. Nonspecific mild thickening of urinary bladder wall. Stomach/Bowel: Diffuse mild small bowel distension probable mild postop ileus without evidence of transition point in caliber of small bowel. The patient is status post right colectomy. The entero colonic CT nostril Moses in proximal transverse colon axial image 41 appears patent. There is no evidence of stricture. Some fluid and gas noted in proximal transverse  colon. Moderate gas noted in splenic flexure of the colon. No distal colitis or obstruction. Vascular/Lymphatic: No retroperitoneal or mesenteric adenopathy. Reproductive: No pelvic mass is noted. Prostate gland and seminal vesicles are unremarkable. Other: There is free air in upper abdomen anterior to the liver most likely postsurgical in nature. Musculoskeletal: No destructive bony lesions are noted. Sagittal images of the spine shows mild degenerative changes lower thoracic and lumbar spine. Again noted mild sclerotic changes probable renal osteodystrophy. Schmorl's node deformity upper endplate of L4 and L5 vertebral body. Degenerative cystic changes noted bilateral proximal femur. IMPRESSION: 1. Status post right colectomy. The entero colonic anastomosis in right hepatic flexure appears patent. 2. Diffuse mild small bowel distension probable postop ileus without evidence of transition point in caliber of small bowel. No contrast extravasation. 3. No hydronephrosis or hydroureter. Again noted multiple bilateral renal cyst probable polycystic kidney disease. Left nonobstructive nephrolithiasis. 4. No distal colonic obstruction.  No distal colitis. 5. Free intraperitoneal air most likely postsurgical in nature. Clinical correlation is necessary. Electronically Signed   By: Lahoma Crocker M.D.   On: 08/09/2015 15:23    Medications / Allergies:  Scheduled Meds: . antiseptic oral rinse  7 mL Mouth Rinse BID  . calcium acetate  2,001 mg Oral TID WC  . cinacalcet  90 mg Oral BID WC  . darbepoetin (ARANESP) injection - DIALYSIS  200 mcg Intravenous Q Wed-HD  . docusate sodium  100 mg Oral BID  . doxercalciferol  2 mcg Intravenous Q M,W,F-HD  . folic acid  1 mg Oral Daily  . gabapentin  100 mg Oral BID  . polyethylene glycol  17 g Oral Daily   Continuous Infusions:  PRN Meds:.sodium chloride, sodium chloride, sodium chloride, alteplase, heparin, HYDROmorphone (DILAUDID) injection, lidocaine (PF),  lidocaine-prilocaine, oxyCODONE-acetaminophen, pentafluoroprop-tetrafluoroeth, traMADol  Antibiotics: Anti-infectives    Start     Dose/Rate Route Frequency Ordered Stop   08/04/15 2200  neomycin (MYCIFRADIN) tablet 1,000 mg  Status:  Discontinued     1,000 mg Oral 3 times per day 08/04/15 1802 08/05/15 1659   08/04/15 2200  metroNIDAZOLE (FLAGYL) tablet 1,000 mg  Status:  Discontinued     1,000 mg Oral 3 times per day 08/04/15 1802 08/05/15 1659   08/02/15 1600  piperacillin-tazobactam (ZOSYN) IVPB 2.25 g  Status:  Discontinued     2.25 g 100 mL/hr over 30 Minutes Intravenous Every 8 hours 08/02/15 1338 08/10/15 0920   08/01/15 1600  piperacillin-tazobactam (ZOSYN) IVPB 2.25 g  Status:  Discontinued     2.25 g 100 mL/hr over 30 Minutes Intravenous Every 8 hours 08/01/15 0805 08/02/15 1338   08/01/15 0745  piperacillin-tazobactam (ZOSYN) IVPB 3.375 g     3.375 g 100 mL/hr over 30 Minutes Intravenous  Once 08/01/15 0736 08/01/15 0836      Colon, segmental resection for tumor, Right - EROSION AND ULCERATION WITH FOCAL PERFORATION. - SEROSITIS AND FIBROUS ADHESIONS. - THIRTY-NINE BENIGN LYMPH NODES. - BENIGN APPENDIX. - DIVERTICULOSIS. - NO DYSPLASIA OR MALIGNANCY. - SEE COMMENT. 2. Small intestine, resection, Terminal Ileum - BENIGN SMALL BOWEL WITH SEROSITIS AND FIBROUS ADHESIONS. - NO DYSPLASIA OR MALIGNANCY.  Assessment/Plan Focal perforation/diverticulosis  POD#5 laparoscopic assisted right hemicolectomy---Dr. Barry Dienes -stable for DC.  Instructions provided.  Follow up arranged. -no malignancy on pathology, discussed with the patient.  FEN-colace/miralax.  VTE prophylaxis-scd, heparin okay Dispo-may dc home from a surgical standpoint   Erby Pian, [facility] Pager [phone]([id number]-4:30P) For consults and floor pages call [phone](7A-4:30P)  08/10/2015 10:02 AM

## 2015-08-10 NOTE — Progress Notes (Addendum)
Patient Discharge: Disposition:  Patient discharged to home.  Education:  Reviewed medications, prescriptions, discharge instructions, and follow-up appointments, understood and acknowledged. Given easy to read handout on after care instructions of colectomy. IV: Discontinued IV before discharge. Telemetry: N/A Transportation: Patient walked out of the unit accompanied by the son and refused volunteer or staff to accompany him. Belongings: Patient took all his belongings with him.

## 2015-08-12 DIAGNOSIS — N186 End stage renal disease: Secondary | ICD-10-CM | POA: Diagnosis not present

## 2015-08-12 DIAGNOSIS — I12 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 stage renal disease: Secondary | ICD-10-CM | POA: Diagnosis not present

## 2015-08-12 DIAGNOSIS — Z992 Dependence on renal dialysis: Secondary | ICD-10-CM | POA: Diagnosis not present

## 2015-08-12 DIAGNOSIS — E875 Hyperkalemia: Secondary | ICD-10-CM | POA: Diagnosis not present

## 2015-08-12 DIAGNOSIS — D509 Iron deficiency anemia, unspecified: Secondary | ICD-10-CM | POA: Diagnosis not present

## 2015-08-12 DIAGNOSIS — N2581 Secondary hyperparathyroidism of renal origin: Secondary | ICD-10-CM | POA: Diagnosis not present

## 2015-08-12 DIAGNOSIS — E1129 Type 2 diabetes mellitus with other diabetic kidney complication: Secondary | ICD-10-CM | POA: Diagnosis not present

## 2015-08-12 DIAGNOSIS — D631 Anemia in chronic kidney disease: Secondary | ICD-10-CM | POA: Diagnosis not present

## 2015-08-13 NOTE — Discharge Summary (Signed)
[facility] Discharge Summary   Patient: Carl Boedeker Sr. 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 Follow-up with PCP as needed. 2. Follow-up with gastroenterology for a repeat colonoscopy in one year. 3. Follow-up with general surgery as recommended.  Follow-up Information    Follow up with [facility] On 08/15/2015.   Specialty:  General Surgery   Why:  10am to have staples removed by the nurse.   Contact information:   [address] [place] [address] [phone]       Follow up with Stark Klein, MD On 08/26/2015.   Specialty:  General Surgery   Why:  arrive by 10:30AM for a 10:50AM post operative check up with your surgeon.    Contact information:   [address] [place] [address] [phone]       Follow up with Lucrezia Starch, MD.   Specialty:  Nephrology   Why:  As needed   Contact information:   [address] [place]  [address] [phone]       Call Jerene Bears, MD.   Specialty:  Gastroenterology   Why:  As needed, repeat colonoscopy in 1 year.   Contact information:   [address] [place] [address] [phone]      Diet recommendation: Renal diet  Activity: The patient is advised to gradually reintroduce usual activities.  Discharge Condition: good  Code Status: Full code  History of present illness: As per the H and P dictated on admission, "[name] is a 55 y.o. male w/ PMHx of ESRD on HD, OSA, HTN, anemia,  and recent admission for diverticulitis, presents to the ED w/ complaints of syncope and rectal bleeding. Patient is a Games developer. Says his bleeding started this AM as what he thought was diarrhea, but realized there was copious amounts of bright red blood in his stool. He has had a recent admission for diverticulitis (discharged 6/10) and was sent home with Cipro and Flagyl for total of 14 day course (ending 08/04/15). He continues to have RLQ pain, but denies fever, chills, nausea, or vomiting.   Discussed risks of not accepting blood products 2/2 [employer] faith. Patient agreed that if he develops signs of hypovolemic shock requiring pressors or has Hb < 7, he will in fact accept blood products. "  Hospital Course:  Summary of his active problems in the hospital is as following. 1. GI bleed Large ulcer causing obstruction at the ileocecal wall  CT abdomen suggested cecal wall thickening concerning for diverticulitis. Colonoscopies revealing an ulcer concerning for carcinoma. Biopsies are taken. General surgery is consulted and patient underwent colectomy with anastomosis 08/05/2015. Surgery identified nearly perforated cecal mass, biopsy shows no evidence of malignancy. Currently bleeding resolved. Treated with IV Zosyn. Finish 7 day treatment course, last day 08/08/2015  2. ESRD on hemodialysis. Currently euvolemic. Appreciate nephrology consultation. Continuing Sensipar  3. Syncope. Likely from acute blood loss. No focal deficit no events on telemetry. Patient is ambulating without  any assistance at present.  4. Acute blood loss anemia. Status post DDAVP 1, Feraheme 1. Refusing blood transfusion. Need to monitor for significant anemia.  5. Postoperative ileus. Resolved CT abdomen and pelvis does not show any evidence of acute abnormality other than postoperative ileus.  All other chronic medical condition were stable during the hospitalization.  Patient was  ambulatory without any assistance. On the day of the discharge the patient's hemoglobin was stable and vitals are stable, and no other acute medical condition were reported by patient. the patient was felt safe to be discharge at home with family.  Procedures and Results:  Colonoscopy  Hemodialysis  Right Colectomy partial  Consultations:  Nephrology  Gastroenterology  General surgery  DISCHARGE MEDICATION: Discharge Medication List as of 08/10/2015  4:24 PM    START taking these medications   Details  docusate sodium (COLACE) 100 MG capsule Take 1 capsule (100 mg total) by mouth 2 (two) times daily as needed for mild constipation., Starting 08/10/2015, Until Discontinued, Normal    oxyCODONE-acetaminophen (PERCOCET/ROXICET) 5-325 MG tablet Take 1 tablet by mouth every 8 (eight) hours as needed for moderate pain., Starting 08/10/2015, Until Discontinued, Print      CONTINUE these medications which have CHANGED   Details  cinacalcet (SENSIPAR) 90 MG tablet Take 1 tablet (90 mg total) by mouth 2 (two) times daily., Starting 08/10/2015, Until Discontinued, Normal      CONTINUE these medications which have NOT CHANGED   Details  aspirin EC 81 MG tablet Take 1 tablet (81 mg total) by mouth daily., Starting 08/26/2014, Until Discontinued, Normal    atorvastatin (LIPITOR) 10 MG tablet Take 1 tablet (10 mg total) by mouth daily at 6 PM., Starting 05/25/2014, Until Discontinued, Print    calcium acetate (PHOSLO) 667 MG capsule Take 2,001 mg by mouth 3 (three) times daily with meals. , Until Discontinued, Historical Med    folic acid-vitamin b complex-vitamin c-selenium-zinc (DIALYVITE) 3 MG TABS tablet Take 1 tablet by mouth daily., Until Discontinued, Historical Med    gabapentin (NEURONTIN) 100 MG capsule Take 100 mg by mouth 2 (two) times daily. , Starting 09/29/2014, Until Discontinued, Historical Med    lanthanum (FOSRENOL) 1000 MG chewable tablet Chew 1,000 mg by mouth 3 (three)  times daily with meals., Until Discontinued, Historical Med    sorbitol 70 % solution Take 15-60 mLs by mouth daily as needed (dialysis days). , Until Discontinued, Historical Med      STOP taking these medications     ibuprofen (ADVIL,MOTRIN) 800 MG tablet      butalbital-acetaminophen-caffeine (FIORICET) 50-325-40 MG tablet      ciprofloxacin (CIPRO) 500 MG tablet      metroNIDAZOLE (FLAGYL) 500 MG tablet        Allergies  Allergen Reactions  . Infed [Iron Dextran] Other (See Comments)    Decreased BP   . Oxycodone Nausea Only  . Vicodin [Hydrocodone-Acetaminophen] Other (See Comments)    Decreased BP  . Diclofenac Other (See Comments)    Unknown  . Whole Blood Other (See Comments)    Unk   Discharge Instructions    Diet renal [id number]    Complete by:  As directed      Discharge instructions    Complete by:  As directed   It is important that you read following instructions as well as go over your medication list with RN to help you understand your care after this hospitalization.  Discharge Instructions: Please follow-up with PCP in one week  Please request your primary care physician to go over all Hospital Tests and Procedure/Radiological results at the follow up,  Please get all Hospital records sent to your PCP by signing hospital release before you go home.   Do not drive, operating heavy machinery, perform activities at heights, swimming or participation in water activities or provide baby sitting services while your are on Pain, Sleep and Anxiety Medications; until you have been seen by Primary Care Physician or a Neurologist and advised to do so again. Do not take more than prescribed Pain, Sleep and Anxiety Medications. You were cared for by a hospitalist during your hospital stay. If you have any questions about your discharge medications or the care you received while you were in the hospital after you are discharged, you can call the unit and ask to  speak with the hospitalist on call if the hospitalist that took care of you is not available.  Once you are discharged, your primary care physician will handle any further medical issues. Please note that NO REFILLS for any discharge medications will be authorized once you are discharged, as it is imperative that you return to your primary care physician (or establish a relationship with a primary care physician if you do not have one) for your aftercare needs so that they can reassess your need for medications and monitor your lab values. You Must read complete instructions/literature along with all the possible adverse reactions/side effects for all the Medicines you take and that have been prescribed to you. Take any new Medicines after you have completely understood and accept all the possible adverse reactions/side effects. Wear Seat belts while driving. If you have smoked or chewed Tobacco in the last 2 yrs please stop smoking and/or stop any Recreational drug use.     Increase activity slowly    Complete by:  As directed           Discharge Exam: Filed Weights   08/09/15 2129 08/10/15 0700 08/10/15 1108  Weight: 97.07 kg (214 lb) 97.1 kg (214 lb 1.1 oz) 95.7 kg (210 lb 15.7 oz)   Filed Vitals:   08/10/15 1100 08/10/15 1108  BP: 122/85 107/69  Pulse: 92 87  Temp:  98.1 F (36.7 C)  Resp:  16   General: Appear in no distress, no Rash; Oral Mucosa moist. Cardiovascular: S1 and S2 Present, aortic systolic Murmur, no JVD Respiratory: Bilateral Air entry present and Clear to Auscultation, no Crackles, no wheezes Abdomen: Bowel Sound present, Soft and no tenderness Extremities: no Pedal edema, no calf tenderness Neurology: Grossly no focal neuro deficit.  The results of significant diagnostics from this hospitalization (including imaging, microbiology, ancillary and laboratory) are listed below for reference.    Significant Diagnostic Studies: Ct Abdomen Pelvis Wo  Contrast  08/09/2015  CLINICAL DATA:  Postop pain , status post right colectomy 08/05/2015 EXAM: CT ABDOMEN AND PELVIS WITHOUT CONTRAST TECHNIQUE: Multidetector CT imaging of the abdomen and pelvis was performed following the standard protocol without IV contrast. COMPARISON:  CT scan 08/01/2015 FINDINGS: Lower chest: Tiny pleural based nodule in right middle lobe measures 3 mm stable in size in appearance from prior exam. Atelectasis noted left base posteriorly. Hepatobiliary: Unenhanced liver is unremarkable. Pancreas: Unenhanced pancreas is unremarkable. Spleen: Unenhanced spleen is unremarkable. Adrenals/Urinary Tract: No adrenal gland mass. Unenhanced kidneys shows multiple renal cysts bilaterally probable polycystic kidney disease. Punctate calcifications left kidney are stable from prior exam. No hydronephrosis or hydroureter. No calcified ureteral calculi. Nonspecific mild thickening of  urinary bladder wall. Stomach/Bowel: Diffuse mild small bowel distension probable mild postop ileus without evidence of transition point in caliber of small bowel. The patient is status post right colectomy. The entero colonic CT nostril Moses in proximal transverse colon axial image 41 appears patent. There is no evidence of stricture. Some fluid and gas noted in proximal transverse colon. Moderate gas noted in splenic flexure of the colon. No distal colitis or obstruction. Vascular/Lymphatic: No retroperitoneal or mesenteric adenopathy. Reproductive: No pelvic mass is noted. Prostate gland and seminal vesicles are unremarkable. Other: There is free air in upper abdomen anterior to the liver most likely postsurgical in nature. Musculoskeletal: No destructive bony lesions are noted. Sagittal images of the spine shows mild degenerative changes lower thoracic and lumbar spine. Again noted mild sclerotic changes probable renal osteodystrophy. Schmorl's node deformity upper endplate of L4 and L5 vertebral body. Degenerative  cystic changes noted bilateral proximal femur. IMPRESSION: 1. Status post right colectomy. The entero colonic anastomosis in right hepatic flexure appears patent. 2. Diffuse mild small bowel distension probable postop ileus without evidence of transition point in caliber of small bowel. No contrast extravasation. 3. No hydronephrosis or hydroureter. Again noted multiple bilateral renal cyst probable polycystic kidney disease. Left nonobstructive nephrolithiasis. 4. No distal colonic obstruction.  No distal colitis. 5. Free intraperitoneal air most likely postsurgical in nature. Clinical correlation is necessary. Electronically Signed   By: Lahoma Crocker M.D.   On: 08/09/2015 15:23   Ct Chest Wo Contrast  07/22/2015  CLINICAL DATA:  Right lung nodule EXAM: CT CHEST WITHOUT CONTRAST TECHNIQUE: Multidetector CT imaging of the chest was performed following the standard protocol without IV contrast. COMPARISON:  CT abdomen 07/21/2015 FINDINGS: No abnormal mediastinal adenopathy. Three vessel coronary artery calcifications. Minimal aortic arch atherosclerotic calcification. No pericardial effusion. No pneumothorax.  No pleural effusion Tiny visceral pleural node in the right minor fissure corresponds to the abnormality described on the CT. Bibasilar heterogeneous dependent opacities are compatible with volume loss versus patchy pneumonia. Irregular pleural based tiny parenchymal density in the right upper lobe on image 38 is likely scarring. No destructive bone lesion. Mild wedging of mid-level thoracic vertebral bodies has a chronic appearance. IMPRESSION: The abnormality noted on the CT abdomen towards the right lung base is a sub cm short axis diameter of visceral pleural node and has a benign appearance. Bibasilar patchy atelectasis versus airspace disease. Electronically Signed   By: Marybelle Killings M.D.   On: 07/22/2015 10:27   Ct Abdomen Pelvis W Contrast  08/01/2015  CLINICAL DATA:  55 year old male with a history  of right-sided abdominal pain for 3 weeks EXAM: CT ABDOMEN AND PELVIS WITH CONTRAST TECHNIQUE: Multidetector CT imaging of the abdomen and pelvis was performed using the standard protocol following bolus administration of intravenous contrast. CONTRAST:  1 ISOVUE-300 IOPAMIDOL (ISOVUE-300) INJECTION 61% COMPARISON:  CT 07/21/2015, 09/03/2013 FINDINGS: Lower chest: Unremarkable appearance of the soft tissues of the chest wall. Heart size within normal limits.  No pericardial fluid/thickening. Calcifications of the coronary arteries in the distribution of the left main, circumflex, right coronary arteries. No lower mediastinal adenopathy. Unremarkable appearance of the distal esophagus. No hiatal hernia. No confluent airspace disease, pleural fluid, or pneumothorax within visualized lung. Small pulmonary nodule associated with the minor fissure on the right measures 3 mm. Abdomen/pelvis: Unremarkable appearance of liver, spleen. Unremarkable appearance of bilateral adrenal glands. Unremarkable appearance of the pancreas with no cystic changes. Unremarkable gallbladder. Stigmata of polycystic kidney disease. No right or left-sided  hydronephrosis. Punctate calcifications within the left kidney may represent nonobstructive stones versus calcifications. Cecal wall thickening with associated inflammatory changes of the adjacent mesenteric and trace amount of fluid within the fracture planes. No abscess. No free intraperitoneal air. Background of colonic diverticula with inspissated secretions. Normal appendix. No significant inflammatory changes identified within the distal ileum. Overall the inflammatory changes are similar in appearance to the CT that was performed 07/21/2015. No abnormally distended small bowel or colon. Colonic diverticula extend through the length of the colon. Unremarkable course caliber and contour of the abdominal aorta and iliac vessels. Minimal atherosclerotic changes. No free air.  No pelvic  free fluid. Unremarkable appearance of the urinary bladder No displaced fracture. Sclerotic appearance of this musculoskeletal elements, compatible with renal osteodystrophy. IMPRESSION: Similar appearance of inflammation involving the cecum, which is either unchanged or recurrent from the CT performed 07/21/2015. Favored diagnosis is cecal diverticulitis in the absence of a known history of inflammatory bowel disease. Future correlation with colonoscopy is recommended, as an inflammatory tumor cannot be excluded. Stigmata of polycystic kidney disease with associated renal osteodystrophy. Coronary artery disease. There is a 3 mm nodule associated with the minor fissure. If the patient is at high risk for bronchogenic carcinoma, follow-up chest CT at 1 year is recommended. If the patient is at low risk, no follow-up is needed. This recommendation follows the consensus statement: Guidelines for Management of Small Pulmonary Nodules Detected on CT Scans: A Statement from the [employer] as published in Radiology 2005; 237:395-400. These results were called by telephone at the time of interpretation on 08/01/2015 at 7:28 am to Dr. Venora Maples, who verbally acknowledged these results. Signed, Dulcy Fanny. Earleen Newport, DO [facility] Radiology Electronically Signed   By: Corrie Mckusick D.O.   On: 08/01/2015 07:34   Ct Abdomen Pelvis W Contrast  07/21/2015  CLINICAL DATA:  Diffuse abdominal pain particularly in the right lower quadrant today EXAM: CT ABDOMEN AND PELVIS WITH CONTRAST TECHNIQUE: Multidetector CT imaging of the abdomen and pelvis was performed using the standard protocol following bolus administration of intravenous contrast. CONTRAST:  100 mL Isovue-300 COMPARISON:  09/02/2013 FINDINGS: Lower chest: Possible partially visualized pulmonary nodule right lung base on the cranial most image measuring at least 3 mm. This could alternatively represent volume averaging with  vascular anatomy. There is mild dependent atelectasis. Hepatobiliary: The liver and gallbladder are normal Pancreas: Pancreas is normal Spleen: Spleen is normal Adrenals/Urinary Tract: Adrenal glands are normal. No hydronephrosis. Innumerable bilateral renal cysts. 2 mm stone midpole left kidney. Bladder decompressed and bladder wall appears thickened. Stomach/Bowel: There is extensive diverticulosis throughout the entire large bowel particularly the cecum, descending colon, and sigmoid colon. Sigmoid wall is thickened with moderate surrounding inflammatory change. Appendix is prominent at 10 mm but the inflammatory process does not appear to centered on the appendix. Vascular/Lymphatic: No acute findings. Small right lower quadrant lymph nodes nonpathologic by size criteria Reproductive: Negative Other: Trace right pericolic gutter free fluid Musculoskeletal: No acute findings. Diffuse osteosclerosis likely related to chronic renal disease. IMPRESSION: 1. Findings most consistent with diverticulitis of the cecum. 2. Evidence of polycystic renal disease 3. Cannot exclude pulmonary nodule right lung base. CT thorax without contrast recommended. Electronically Signed   By: Skipper Cliche M.D.   On: 07/21/2015 10:19   Dg Chest Port 1 View  08/01/2015  CLINICAL DATA:  Shortness of breath, syncope EXAM: PORTABLE CHEST 1 VIEW COMPARISON:  02/08/2009.  Chest CT 07/22/2015 FINDINGS: Heart and  mediastinal contours are within normal limits. No focal opacities or effusions. No acute bony abnormality. IMPRESSION: No active disease. Electronically Signed   By: Rolm Baptise M.D.   On: 08/01/2015 07:12    Microbiology: Recent Results (from the past 240 hour(s))  Surgical pcr screen     Status: None   Collection Time: 08/04/15 11:01 PM  Result Value Ref Range Status   MRSA, PCR NEGATIVE NEGATIVE Final   Staphylococcus aureus NEGATIVE NEGATIVE Final    Comment:        The Xpert SA Assay (FDA approved for NASAL  specimens in patients over 14 years of age), is one component of a comprehensive surveillance program.  Test performance has been validated by [facility] for patients greater than or equal to 91 year old. It is not intended to diagnose infection nor to guide or monitor treatment.      Labs: CBC:  Recent Labs Lab 08/06/15 0451 08/07/15 0523 08/08/15 0712 08/10/15 0710  WBC 9.8 8.8 9.5 8.0  NEUTROABS 8.0*  --   --   --   HGB 7.9* 8.4* 8.0* 8.3*  HCT 24.9* 27.6* 25.2* 25.7*  MCV 89.6 91.7 92.0 90.8  PLT 298 335 322 [id number]   Basic Metabolic Panel:  Recent Labs Lab 08/06/15 0451 08/06/15 0725 08/07/15 0523 08/08/15 0712 08/10/15 0710  NA 137 138 136 134* 135  K 4.0 3.9 4.4 4.3 3.9  CL 100* 101 99* 98* 95*  CO2 23 22 25 24 25   GLUCOSE 70 77 74 79 80  BUN 19 19 10 15 13   CREATININE 10.20* 10.13* 6.55* 9.14* 8.37*  CALCIUM 7.6* 7.7* 7.6* 7.3* 7.9*  PHOS  --  6.9* 4.2 5.4* 5.5*   Liver Function Tests:  Recent Labs Lab 08/06/15 0451 08/06/15 0725 08/07/15 0523 08/08/15 0712 08/10/15 0710  AST 13*  --   --   --   --   ALT 9*  --   --   --   --   ALKPHOS 55  --   --   --   --   BILITOT 0.5  --   --   --   --   PROT 6.2*  --   --   --   --   ALBUMIN 2.0* 2.0* 2.3* 2.1* 2.1*   Time spent: 30 minutes  Signed:  PATEL, PRANAV  [facility] 08/10/2015 , 12:13 AM

## 2015-08-15 DIAGNOSIS — N186 End stage renal disease: Secondary | ICD-10-CM | POA: Diagnosis not present

## 2015-08-15 DIAGNOSIS — D631 Anemia in chronic kidney disease: Secondary | ICD-10-CM | POA: Diagnosis not present

## 2015-08-15 DIAGNOSIS — E1129 Type 2 diabetes mellitus with other diabetic kidney complication: Secondary | ICD-10-CM | POA: Diagnosis not present

## 2015-08-15 DIAGNOSIS — N2581 Secondary hyperparathyroidism of renal origin: Secondary | ICD-10-CM | POA: Diagnosis not present

## 2015-08-15 DIAGNOSIS — E875 Hyperkalemia: Secondary | ICD-10-CM | POA: Diagnosis not present

## 2015-08-17 DIAGNOSIS — N186 End stage renal disease: Secondary | ICD-10-CM | POA: Diagnosis not present

## 2015-08-17 DIAGNOSIS — D631 Anemia in chronic kidney disease: Secondary | ICD-10-CM | POA: Diagnosis not present

## 2015-08-17 DIAGNOSIS — E1129 Type 2 diabetes mellitus with other diabetic kidney complication: Secondary | ICD-10-CM | POA: Diagnosis not present

## 2015-08-17 DIAGNOSIS — E875 Hyperkalemia: Secondary | ICD-10-CM | POA: Diagnosis not present

## 2015-08-17 DIAGNOSIS — N2581 Secondary hyperparathyroidism of renal origin: Secondary | ICD-10-CM | POA: Diagnosis not present

## 2015-08-19 DIAGNOSIS — N2581 Secondary hyperparathyroidism of renal origin: Secondary | ICD-10-CM | POA: Diagnosis not present

## 2015-08-19 DIAGNOSIS — E1129 Type 2 diabetes mellitus with other diabetic kidney complication: Secondary | ICD-10-CM | POA: Diagnosis not present

## 2015-08-19 DIAGNOSIS — E875 Hyperkalemia: Secondary | ICD-10-CM | POA: Diagnosis not present

## 2015-08-19 DIAGNOSIS — D631 Anemia in chronic kidney disease: Secondary | ICD-10-CM | POA: Diagnosis not present

## 2015-08-19 DIAGNOSIS — N186 End stage renal disease: Secondary | ICD-10-CM | POA: Diagnosis not present

## 2015-08-22 DIAGNOSIS — E875 Hyperkalemia: Secondary | ICD-10-CM | POA: Diagnosis not present

## 2015-08-22 DIAGNOSIS — D631 Anemia in chronic kidney disease: Secondary | ICD-10-CM | POA: Diagnosis not present

## 2015-08-22 DIAGNOSIS — N186 End stage renal disease: Secondary | ICD-10-CM | POA: Diagnosis not present

## 2015-08-22 DIAGNOSIS — E1129 Type 2 diabetes mellitus with other diabetic kidney complication: Secondary | ICD-10-CM | POA: Diagnosis not present

## 2015-08-22 DIAGNOSIS — N2581 Secondary hyperparathyroidism of renal origin: Secondary | ICD-10-CM | POA: Diagnosis not present

## 2015-08-24 DIAGNOSIS — D631 Anemia in chronic kidney disease: Secondary | ICD-10-CM | POA: Diagnosis not present

## 2015-08-24 DIAGNOSIS — E875 Hyperkalemia: Secondary | ICD-10-CM | POA: Diagnosis not present

## 2015-08-24 DIAGNOSIS — N186 End stage renal disease: Secondary | ICD-10-CM | POA: Diagnosis not present

## 2015-08-24 DIAGNOSIS — E1129 Type 2 diabetes mellitus with other diabetic kidney complication: Secondary | ICD-10-CM | POA: Diagnosis not present

## 2015-08-24 DIAGNOSIS — N2581 Secondary hyperparathyroidism of renal origin: Secondary | ICD-10-CM | POA: Diagnosis not present

## 2015-08-26 DIAGNOSIS — N2581 Secondary hyperparathyroidism of renal origin: Secondary | ICD-10-CM | POA: Diagnosis not present

## 2015-08-26 DIAGNOSIS — E875 Hyperkalemia: Secondary | ICD-10-CM | POA: Diagnosis not present

## 2015-08-26 DIAGNOSIS — D631 Anemia in chronic kidney disease: Secondary | ICD-10-CM | POA: Diagnosis not present

## 2015-08-26 DIAGNOSIS — E1129 Type 2 diabetes mellitus with other diabetic kidney complication: Secondary | ICD-10-CM | POA: Diagnosis not present

## 2015-08-26 DIAGNOSIS — N186 End stage renal disease: Secondary | ICD-10-CM | POA: Diagnosis not present

## 2015-08-29 DIAGNOSIS — D631 Anemia in chronic kidney disease: Secondary | ICD-10-CM | POA: Diagnosis not present

## 2015-08-29 DIAGNOSIS — N186 End stage renal disease: Secondary | ICD-10-CM | POA: Diagnosis not present

## 2015-08-29 DIAGNOSIS — N2581 Secondary hyperparathyroidism of renal origin: Secondary | ICD-10-CM | POA: Diagnosis not present

## 2015-08-29 DIAGNOSIS — E1129 Type 2 diabetes mellitus with other diabetic kidney complication: Secondary | ICD-10-CM | POA: Diagnosis not present

## 2015-08-29 DIAGNOSIS — E875 Hyperkalemia: Secondary | ICD-10-CM | POA: Diagnosis not present

## 2015-08-31 DIAGNOSIS — D631 Anemia in chronic kidney disease: Secondary | ICD-10-CM | POA: Diagnosis not present

## 2015-08-31 DIAGNOSIS — E1129 Type 2 diabetes mellitus with other diabetic kidney complication: Secondary | ICD-10-CM | POA: Diagnosis not present

## 2015-08-31 DIAGNOSIS — N186 End stage renal disease: Secondary | ICD-10-CM | POA: Diagnosis not present

## 2015-08-31 DIAGNOSIS — E875 Hyperkalemia: Secondary | ICD-10-CM | POA: Diagnosis not present

## 2015-08-31 DIAGNOSIS — N2581 Secondary hyperparathyroidism of renal origin: Secondary | ICD-10-CM | POA: Diagnosis not present

## 2015-09-02 DIAGNOSIS — N2581 Secondary hyperparathyroidism of renal origin: Secondary | ICD-10-CM | POA: Diagnosis not present

## 2015-09-02 DIAGNOSIS — E1129 Type 2 diabetes mellitus with other diabetic kidney complication: Secondary | ICD-10-CM | POA: Diagnosis not present

## 2015-09-02 DIAGNOSIS — E875 Hyperkalemia: Secondary | ICD-10-CM | POA: Diagnosis not present

## 2015-09-02 DIAGNOSIS — N186 End stage renal disease: Secondary | ICD-10-CM | POA: Diagnosis not present

## 2015-09-02 DIAGNOSIS — D631 Anemia in chronic kidney disease: Secondary | ICD-10-CM | POA: Diagnosis not present

## 2015-09-05 DIAGNOSIS — N186 End stage renal disease: Secondary | ICD-10-CM | POA: Diagnosis not present

## 2015-09-05 DIAGNOSIS — D631 Anemia in chronic kidney disease: Secondary | ICD-10-CM | POA: Diagnosis not present

## 2015-09-05 DIAGNOSIS — N2581 Secondary hyperparathyroidism of renal origin: Secondary | ICD-10-CM | POA: Diagnosis not present

## 2015-09-05 DIAGNOSIS — E1129 Type 2 diabetes mellitus with other diabetic kidney complication: Secondary | ICD-10-CM | POA: Diagnosis not present

## 2015-09-05 DIAGNOSIS — E875 Hyperkalemia: Secondary | ICD-10-CM | POA: Diagnosis not present

## 2015-09-07 DIAGNOSIS — E1129 Type 2 diabetes mellitus with other diabetic kidney complication: Secondary | ICD-10-CM | POA: Diagnosis not present

## 2015-09-07 DIAGNOSIS — D631 Anemia in chronic kidney disease: Secondary | ICD-10-CM | POA: Diagnosis not present

## 2015-09-07 DIAGNOSIS — N2581 Secondary hyperparathyroidism of renal origin: Secondary | ICD-10-CM | POA: Diagnosis not present

## 2015-09-07 DIAGNOSIS — N186 End stage renal disease: Secondary | ICD-10-CM | POA: Diagnosis not present

## 2015-09-07 DIAGNOSIS — E875 Hyperkalemia: Secondary | ICD-10-CM | POA: Diagnosis not present

## 2015-09-09 DIAGNOSIS — N186 End stage renal disease: Secondary | ICD-10-CM | POA: Diagnosis not present

## 2015-09-09 DIAGNOSIS — D631 Anemia in chronic kidney disease: Secondary | ICD-10-CM | POA: Diagnosis not present

## 2015-09-09 DIAGNOSIS — E1129 Type 2 diabetes mellitus with other diabetic kidney complication: Secondary | ICD-10-CM | POA: Diagnosis not present

## 2015-09-09 DIAGNOSIS — E875 Hyperkalemia: Secondary | ICD-10-CM | POA: Diagnosis not present

## 2015-09-09 DIAGNOSIS — N2581 Secondary hyperparathyroidism of renal origin: Secondary | ICD-10-CM | POA: Diagnosis not present

## 2015-09-10 ENCOUNTER — Emergency Department (HOSPITAL_COMMUNITY)
Admission: EM | Admit: 2015-09-10 | Discharge: 2015-09-10 | Disposition: A | Discharge status: Home / Self Care | Payer: Medicare Other | Attending: Emergency Medicine | Admitting: Emergency Medicine

## 2015-09-10 ENCOUNTER — Encounter (HOSPITAL_COMMUNITY): Payer: Self-pay

## 2015-09-10 ENCOUNTER — Emergency Department (HOSPITAL_COMMUNITY): Payer: Medicare Other

## 2015-09-10 DIAGNOSIS — K5792 Diverticulitis of intestine, part unspecified, without perforation or abscess without bleeding: Secondary | ICD-10-CM | POA: Insufficient documentation

## 2015-09-10 DIAGNOSIS — Z7982 Long term (current) use of aspirin: Secondary | ICD-10-CM | POA: Insufficient documentation

## 2015-09-10 DIAGNOSIS — Z87891 Personal history of nicotine dependence: Secondary | ICD-10-CM | POA: Diagnosis not present

## 2015-09-10 DIAGNOSIS — I12 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 stage renal disease: Secondary | ICD-10-CM | POA: Insufficient documentation

## 2015-09-10 DIAGNOSIS — N186 End stage renal disease: Secondary | ICD-10-CM | POA: Diagnosis not present

## 2015-09-10 DIAGNOSIS — Z79899 Other long term (current) drug therapy: Secondary | ICD-10-CM | POA: Insufficient documentation

## 2015-09-10 DIAGNOSIS — Z992 Dependence on renal dialysis: Secondary | ICD-10-CM | POA: Diagnosis not present

## 2015-09-10 DIAGNOSIS — R1031 Right lower quadrant pain: Secondary | ICD-10-CM | POA: Diagnosis not present

## 2015-09-10 DIAGNOSIS — Z8673 Personal history of transient ischemic attack (TIA), and cerebral infarction without residual deficits: Secondary | ICD-10-CM | POA: Diagnosis not present

## 2015-09-10 DIAGNOSIS — R1032 Left lower quadrant pain: Secondary | ICD-10-CM | POA: Diagnosis present

## 2015-09-10 LAB — CBC
HCT: 39.3 % (ref 39.0–52.0)
Hemoglobin: 11.9 g/dL — ABNORMAL LOW (ref 13.0–17.0)
MCH: 27.8 pg (ref 26.0–34.0)
MCHC: 30.3 g/dL (ref 30.0–36.0)
MCV: 91.8 fL (ref 78.0–100.0)
Platelets: 306 10*3/uL (ref 150–400)
RBC: 4.28 MIL/uL (ref 4.22–5.81)
RDW: 16.2 % — ABNORMAL HIGH (ref 11.5–15.5)
WBC: 9.2 10*3/uL (ref 4.0–10.5)

## 2015-09-10 LAB — COMPREHENSIVE METABOLIC PANEL
ALT: 10 U/L — ABNORMAL LOW (ref 17–63)
AST: 11 U/L — ABNORMAL LOW (ref 15–41)
Albumin: 3 g/dL — ABNORMAL LOW (ref 3.5–5.0)
Alkaline Phosphatase: 79 U/L (ref 38–126)
Anion gap: 14 (ref 5–15)
BUN: 23 mg/dL — ABNORMAL HIGH (ref 6–20)
CO2: 27 mmol/L (ref 22–32)
Calcium: 10.2 mg/dL (ref 8.9–10.3)
Chloride: 94 mmol/L — ABNORMAL LOW (ref 101–111)
Creatinine, Ser: 8.2 mg/dL — ABNORMAL HIGH (ref 0.61–1.24)
GFR calc Af Amer: 7 mL/min — ABNORMAL LOW (ref >60)
GFR calc non Af Amer: 6 mL/min — ABNORMAL LOW (ref >60)
Glucose, Bld: 99 mg/dL (ref 65–99)
Potassium: 3.9 mmol/L (ref 3.5–5.1)
Sodium: 135 mmol/L (ref 135–145)
Total Bilirubin: 0.5 mg/dL (ref 0.3–1.2)
Total Protein: 8.2 g/dL — ABNORMAL HIGH (ref 6.5–8.1)

## 2015-09-10 LAB — LIPASE, BLOOD: Lipase: 157 U/L — ABNORMAL HIGH (ref 11–51)

## 2015-09-10 MED ORDER — HYDROCODONE-ACETAMINOPHEN 5-325 MG PO TABS: 2.0000 | ORAL_TABLET | ORAL | 0 refills | Status: DC | PRN

## 2015-09-10 MED ORDER — FENTANYL CITRATE (PF) 100 MCG/2ML IJ SOLN
50.0000 ug | Freq: Once | INTRAMUSCULAR | Status: AC
  Administered 2015-09-10: 50 ug via INTRAVENOUS
  Filled 2015-09-10: qty 2

## 2015-09-10 MED ORDER — OXYCODONE-ACETAMINOPHEN 5-325 MG PO TABS
1.0000 | ORAL_TABLET | Freq: Once | ORAL | Status: AC
  Administered 2015-09-10: 1 via ORAL
  Filled 2015-09-10: qty 1

## 2015-09-10 MED ORDER — DIATRIZOATE MEGLUMINE & SODIUM 66-10 % PO SOLN
ORAL | Status: AC
  Filled 2015-09-10: qty 30

## 2015-09-10 MED ORDER — OXYCODONE-ACETAMINOPHEN 5-325 MG PO TABS: 1.0000 | ORAL_TABLET | Freq: Three times a day (TID) | ORAL | 0 refills | Status: DC | PRN

## 2015-09-10 MED ORDER — CIPROFLOXACIN HCL 500 MG PO TABS: 500.0000 mg | ORAL_TABLET | Freq: Two times a day (BID) | ORAL | 0 refills | Status: AC

## 2015-09-10 MED ORDER — ONDANSETRON HCL 4 MG PO TABS: 4.0000 mg | ORAL_TABLET | Freq: Four times a day (QID) | ORAL | 0 refills | Status: DC

## 2015-09-10 MED ORDER — ONDANSETRON HCL 4 MG/2ML IJ SOLN
4.0000 mg | Freq: Once | INTRAMUSCULAR | Status: AC
  Administered 2015-09-10: 4 mg via INTRAVENOUS
  Filled 2015-09-10: qty 2

## 2015-09-10 MED ORDER — METRONIDAZOLE 500 MG PO TABS: 500.0000 mg | ORAL_TABLET | Freq: Three times a day (TID) | ORAL | 0 refills | Status: AC

## 2015-09-10 NOTE — ED Provider Notes (Signed)
[facility] DEPT Provider Note   CSN: [id number] Arrival date & time: 09/10/15  1435  First Provider Contact:  None    History   Chief Complaint Chief Complaint  Patient presents with  . Abdominal Pain    HPI [name]. is a 55 y.o. male.  The history is provided by the patient. No language interpreter was used.  Abdominal Pain   This is a new problem. The current episode started 12 to 24 hours ago. The problem occurs constantly. The problem has been gradually worsening. The pain is located in the LLQ. The pain is at a severity of 7/10. The pain is moderate. Associated symptoms include nausea. Pertinent negatives include anorexia, fever, diarrhea, flatus, hematochezia, melena, vomiting, constipation, dysuria, frequency, hematuria, headaches, arthralgias and myalgias. Nothing aggravates the symptoms. Nothing relieves the symptoms. Past workup includes CT scan and surgery. Past medical history comments: Recent diverticulitis with surgery last month..    Past Medical History:  Diagnosis Date  . Anemia   . Chronic headaches   . Chronic kidney disease    Chronic Renal Failure; On Renal Transplant List  . Diverticulitis   . GI bleed   . Hemodialysis patient ([place])    M-W-F  . Hypertension   . Presence of arteriovenous fistula for hemodialysis, primary ([place])    RUE  . Refusal of blood product    NO WHOLE BLOOD PROUCTS  . Sleep apnea   . Stroke [facility])     Patient Active Problem List   Diagnosis Date Noted  . 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 GI bleed 08/01/2015  . Acute blood loss anemia   . Acute diverticulitis   . Lung nodule   . Diverticulitis of cecum 07/21/2015  . Diverticulitis of large intestine without perforation or abscess without bleeding   . Right lower quadrant pain   . Headache 05/05/2015  . 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 Hypertension secondary to other renal disorders 08/26/2014  . Diverticulosis 07/13/2014  .  Secondary hyperparathyroidism ([place]) 07/13/2014  . Lower GI bleed 07/12/2014  . Hematochezia   . Acute posthemorrhagic anemia   . End stage renal disease ([place])   . Refusal of blood transfusions as patient is Jehovah's Witness   . Parotid mass   . HLD (hyperlipidemia)   . PRES (posterior reversible encephalopathy syndrome)   . Stroke ([place]) 05/23/2014  . CVA (cerebral infarction) 05/23/2014  . Anemia of renal disease 05/23/2014  . HTN (hypertension) 12/09/2012  . GERD (gastroesophageal reflux disease) 12/09/2012  . ESRD on dialysis ([place]) 12/09/2012  . Gastrointestinal bleeding 11/12/2012    Past Surgical History:  Procedure Laterality Date  . AV FISTULA PLACEMENT Right    right arm  . COLONOSCOPY N/A 08/04/2015   Procedure: COLONOSCOPY;  Surgeon: Jerene Bears, MD;  Location: [facility] ENDOSCOPY;  Service: Endoscopy;  Laterality: N/A;  . graft left arm Left    for dialysis x 2  . INSERTION OF DIALYSIS CATHETER     Rt chest  . LAPAROSCOPIC RIGHT COLECTOMY N/A 08/05/2015   Procedure: LAPAROSCOPIC RIGHT COLECTOMY- ASCENDING;  Surgeon: Stark Klein, MD;  Location: [facility];  Service: General;  Laterality: N/A;       Home Medications    Prior to Admission medications   Medication Sig Start Date End Date Taking? Authorizing Provider  aspirin EC 81 MG tablet Take 1 tablet (81 mg total) by mouth daily. 08/26/14   Rosalin Hawking, MD  atorvastatin (LIPITOR) 10 MG tablet  Take 1 tablet (10 mg total) by mouth daily at 6 PM. 05/25/14   Thurnell Lose, MD  calcium acetate (PHOSLO) 667 MG capsule Take 2,001 mg by mouth 3 (three) times daily with meals.     Historical Provider, MD  cinacalcet (SENSIPAR) 90 MG tablet Take 1 tablet (90 mg total) by mouth 2 (two) times daily. 08/10/15   Lavina Hamman, MD  ciprofloxacin (CIPRO) 500 MG tablet Take 1 tablet (500 mg total) by mouth 2 (two) times daily. 09/10/15 09/17/15  Vira Blanco, MD  docusate sodium (COLACE) 100 MG capsule Take 1 capsule (100 mg total) by mouth 2  (two) times daily as needed for mild constipation. 08/10/15   Lavina Hamman, MD  folic acid-vitamin b complex-vitamin c-selenium-zinc (DIALYVITE) 3 MG TABS tablet Take 1 tablet by mouth daily.    Historical Provider, MD  gabapentin (NEURONTIN) 100 MG capsule Take 100 mg by mouth 2 (two) times daily.  09/29/14   Historical Provider, MD  HYDROcodone-acetaminophen (NORCO/VICODIN) 5-325 MG tablet Take 2 tablets by mouth every 4 (four) hours as needed. 09/10/15   Vira Blanco, MD  lanthanum (FOSRENOL) 1000 MG chewable tablet Chew 1,000 mg by mouth 3 (three) times daily with meals.    Historical Provider, MD  metroNIDAZOLE (FLAGYL) 500 MG tablet Take 1 tablet (500 mg total) by mouth 3 (three) times daily. 09/10/15 09/17/15  Vira Blanco, MD  ondansetron (ZOFRAN) 4 MG tablet Take 1 tablet (4 mg total) by mouth every 6 (six) hours. 09/10/15   Vira Blanco, MD  oxyCODONE-acetaminophen (PERCOCET/ROXICET) 5-325 MG tablet Take 1 tablet by mouth every 8 (eight) hours as needed for moderate pain. 08/10/15   Lavina Hamman, MD  sorbitol 70 % solution Take 15-60 mLs by mouth daily as needed (dialysis days).     Historical Provider, MD    Family History Family History  Problem Relation Age of Onset  . Diabetes Father   . Stroke Father   . Hypertension Father   . Uterine cancer Mother   . Lupus Sister   . Stroke Sister   . Anuerysm Brother     brain  . Hypertension Sister     Social History Social History  Substance Use Topics  . Smoking status: Former Smoker    Quit date: 06/19/1990  . Smokeless tobacco: Never Used  . Alcohol use 0.6 oz/week    1 Cans of beer per week     Comment: occasional     Allergies   Infed [iron dextran]; Oxycodone; Vicodin [hydrocodone-acetaminophen]; Diclofenac; and Whole blood   Review of Systems Review of Systems  Constitutional: Negative for chills and fever.  HENT: Negative for ear pain and sore throat.   Eyes: Negative for pain and visual disturbance.    Respiratory: Negative for cough and shortness of breath.   Cardiovascular: Negative for chest pain and palpitations.  Gastrointestinal: Positive for abdominal pain and nausea. Negative for anal bleeding, anorexia, blood in stool, constipation, diarrhea, flatus, hematochezia, melena and vomiting.  Genitourinary: Negative for dysuria, frequency and hematuria.  Musculoskeletal: Negative for arthralgias, back pain and myalgias.  Skin: Negative for color change and rash.  Neurological: Negative for seizures, syncope and headaches.  All other systems reviewed and are negative.    Physical Exam Updated Vital Signs BP 116/87   Pulse 87   Temp 98.5 F (36.9 C) (Oral)   Resp 20   Wt 87.8 kg   SpO2 100%   BMI 25.53 kg/m   Physical Exam  Constitutional: He appears well-developed and well-nourished.  HENT:  Head: Normocephalic and atraumatic.  Eyes: Conjunctivae are normal.  Neck: Neck supple.  Cardiovascular: Normal rate and regular rhythm.   No murmur heard. Pulmonary/Chest: Effort normal and breath sounds normal. No respiratory distress.  Abdominal: Soft. There is tenderness in the left lower quadrant. There is no rigidity, no rebound, no guarding, no CVA tenderness, no tenderness at McBurney's point and negative Murphy's sign.  Musculoskeletal: He exhibits no edema.  Neurological: He is alert.  Skin: Skin is warm and dry.  Psychiatric: He has a normal mood and affect.  Nursing note and vitals reviewed.    ED Treatments / Results  Labs (all labs ordered are listed, but only abnormal results are displayed) Labs Reviewed  LIPASE, BLOOD - Abnormal; Notable for the following:       Result Value   Lipase 157 (*)    All other components within normal limits  COMPREHENSIVE METABOLIC PANEL - Abnormal; Notable for the following:    Chloride 94 (*)    BUN 23 (*)    Creatinine, Ser 8.20 (*)    Total Protein 8.2 (*)    Albumin 3.0 (*)    AST 11 (*)    ALT 10 (*)    GFR calc non  Af Amer 6 (*)    GFR calc Af Amer 7 (*)    All other components within normal limits  CBC - Abnormal; Notable for the following:    Hemoglobin 11.9 (*)    RDW 16.2 (*)    All other components within normal limits  URINALYSIS, ROUTINE W REFLEX MICROSCOPIC (NOT AT [facility])    EKG  EKG Interpretation None       Radiology Ct Abdomen Pelvis Wo Contrast  Result Date: 09/10/2015 CLINICAL DATA:  Right lower quadrant abdominal pain for 1 day. Nausea. Recent laparoscopic abdominal surgery for diverticulitis. Recent dialysis. History of partial colectomy and appendectomy on June 23rd. EXAM: CT ABDOMEN AND PELVIS WITHOUT CONTRAST TECHNIQUE: Multidetector CT imaging of the abdomen and pelvis was performed following the standard protocol without IV contrast. COMPARISON:  CT abdomen dated 08/09/2015 and CT abdomen dated 08/01/2015. FINDINGS: Lower chest:  No acute findings. Hepatobiliary: The liver and gallbladder appear within normal limits for a noncontrast exam. Pancreas: No mass or inflammatory process identified on this un-enhanced exam. Spleen: Within normal limits in size. Adrenals/Urinary Tract: Adrenal glands appear normal. Numerous renal cysts again noted bilaterally indicating polycystic kidney disease. No obstructing renal or ureteral calculi. Bladder is decompressed. Stomach/Bowel: Extensive diverticulosis again noted throughout the colon. There is new thickening of the walls of the lower sigmoid colon with surrounding paracolic inflammation suggesting acute diverticulitis. Additional milder paracolic inflammation noted about the upper sigmoid colon and lower descending colon, raising the alternative possibility of an acute colitis. No paracolic abscess collection seen. There are surgical changes of new partial colectomy (ascending colon). No obstruction, fluid collection or inflammation at the surgical anastomosis. No dilated large or small bowel loops. Vascular/Lymphatic: Abdominal aorta is normal  in caliber. No enlarged lymph nodes seen. Reproductive: No mass or other significant abnormality. Other: No abscess collection.  No free intraperitoneal air. Musculoskeletal: Scoliosis of the thoracolumbar spine. Scattered degenerative changes throughout the spine, mild to moderate in degree, and at each hip joint. No acute or suspicious osseous finding. Superficial soft tissues are unremarkable. Expected postsurgical changes within the midline abdomen. IMPRESSION: 1. New thickening of the walls of the mid sigmoid colon (approximately 7 cm segment of  the mid sigmoid colon) with surrounding paracolic inflammation suggesting acute diverticulitis. Additional milder paracolic inflammation noted about the upper sigmoid colon and lower descending colon, raising the alternative possibility of an acute colitis, less likely 2 concomitant foci of acute diverticulitis. No paracolic abscess collection seen. No free intraperitoneal air. No associated bowel obstruction. 2. Surgical changes of recent partial right colectomy. No evidence of surgical complicating feature. No obstruction, fluid collection or inflammatory change at the surgical anastomosis. Electronically Signed   By: Franki Cabot M.D.   On: 09/10/2015 20:38   Procedures Procedures (including critical care time)  Medications Ordered in ED Medications  diatrizoate meglumine-sodium (GASTROGRAFIN) 66-10 % solution (not administered)  fentaNYL (SUBLIMAZE) injection 50 mcg (50 mcg Intravenous Given 09/10/15 1830)  ondansetron (ZOFRAN) injection 4 mg (4 mg Intravenous Given 09/10/15 1830)     Initial Impression / Assessment and Plan / ED Course  I have reviewed the triage vital signs and the nursing notes.  Pertinent labs & imaging results that were available during my care of the patient were reviewed by me and considered in my medical decision making (see chart for details).  Clinical Course    Patient is a 55 y/o gentleman with history of recent  right colectomy, diverticulitis who presents emergency department for 1 day of left lower quadrant pain described as aching in nature. Patient also with nausea as well. This began while mowing his lawn yesterday. Patient worse pain of 7-8/10. Did not take any pain medications at home.  Patient afebrile here no acute distress. Mild to moderate tenderness in left lower quadrant without rebound or guarding.  Differential diagnosis includes diverticulitis versus abscess versus UTI versus muscular skeletal.  We'll obtain CT noncontrast as patient is an end-stage renal disease patient, basic labs and control pain while awaiting workup.  CT abdomen pelvis with evidence of uncomplicated diverticulitis. Patient's pain controlled in ED.  Patient okay for discharge home with instructions to use Cipro and Flagyl as prescribed. Norco for pain control at home, Zofran for nausea.  Patient encouraged to follow-up with his primary care physician in 3 days for reevaluation to ensure clinical improvement.  Discussed case with my attending, Dr. Gilford Raid.    Final Clinical Impressions(s) / ED Diagnoses   Final diagnoses:  Diverticulitis of intestine without perforation or abscess without bleeding    New Prescriptions New Prescriptions   CIPROFLOXACIN (CIPRO) 500 MG TABLET    Take 1 tablet (500 mg total) by mouth 2 (two) times daily.   HYDROCODONE-ACETAMINOPHEN (NORCO/VICODIN) 5-325 MG TABLET    Take 2 tablets by mouth every 4 (four) hours as needed.   METRONIDAZOLE (FLAGYL) 500 MG TABLET    Take 1 tablet (500 mg total) by mouth 3 (three) times daily.   ONDANSETRON (ZOFRAN) 4 MG TABLET    Take 1 tablet (4 mg total) by mouth every 6 (six) hours.     Vira Blanco, MD 09/10/15 [id number]    Isla Pence, MD 09/10/15 [phone]

## 2015-09-10 NOTE — ED Triage Notes (Signed)
Patient here with generalized abdominal pain x 1 day, nausea without vomiting, had BM this am. Recently had laprascopic abdominal surgery for diverticulitis, last dialysis yesterday

## 2015-09-10 NOTE — ED Notes (Signed)
CT notified EDP is requesting pt go to CT without oral contrast

## 2015-09-10 NOTE — ED Notes (Signed)
Patient transported to CT 

## 2015-09-12 DIAGNOSIS — D631 Anemia in chronic kidney disease: Secondary | ICD-10-CM | POA: Diagnosis not present

## 2015-09-12 DIAGNOSIS — N2581 Secondary hyperparathyroidism of renal origin: Secondary | ICD-10-CM | POA: Diagnosis not present

## 2015-09-12 DIAGNOSIS — I12 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 stage renal disease: Secondary | ICD-10-CM | POA: Diagnosis not present

## 2015-09-12 DIAGNOSIS — Z992 Dependence on renal dialysis: Secondary | ICD-10-CM | POA: Diagnosis not present

## 2015-09-12 DIAGNOSIS — E875 Hyperkalemia: Secondary | ICD-10-CM | POA: Diagnosis not present

## 2015-09-12 DIAGNOSIS — E1129 Type 2 diabetes mellitus with other diabetic kidney complication: Secondary | ICD-10-CM | POA: Diagnosis not present

## 2015-09-12 DIAGNOSIS — N186 End stage renal disease: Secondary | ICD-10-CM | POA: Diagnosis not present

## 2015-09-14 DIAGNOSIS — N186 End stage renal disease: Secondary | ICD-10-CM | POA: Diagnosis not present

## 2015-09-14 DIAGNOSIS — D509 Iron deficiency anemia, unspecified: Secondary | ICD-10-CM | POA: Diagnosis not present

## 2015-09-14 DIAGNOSIS — E875 Hyperkalemia: Secondary | ICD-10-CM | POA: Diagnosis not present

## 2015-09-14 DIAGNOSIS — E1129 Type 2 diabetes mellitus with other diabetic kidney complication: Secondary | ICD-10-CM | POA: Diagnosis not present

## 2015-09-14 DIAGNOSIS — D631 Anemia in chronic kidney disease: Secondary | ICD-10-CM | POA: Diagnosis not present

## 2015-09-14 DIAGNOSIS — N2581 Secondary hyperparathyroidism of renal origin: Secondary | ICD-10-CM | POA: Diagnosis not present

## 2015-09-16 DIAGNOSIS — D631 Anemia in chronic kidney disease: Secondary | ICD-10-CM | POA: Diagnosis not present

## 2015-09-16 DIAGNOSIS — N186 End stage renal disease: Secondary | ICD-10-CM | POA: Diagnosis not present

## 2015-09-16 DIAGNOSIS — E875 Hyperkalemia: Secondary | ICD-10-CM | POA: Diagnosis not present

## 2015-09-16 DIAGNOSIS — D509 Iron deficiency anemia, unspecified: Secondary | ICD-10-CM | POA: Diagnosis not present

## 2015-09-16 DIAGNOSIS — N2581 Secondary hyperparathyroidism of renal origin: Secondary | ICD-10-CM | POA: Diagnosis not present

## 2015-09-16 DIAGNOSIS — E1129 Type 2 diabetes mellitus with other diabetic kidney complication: Secondary | ICD-10-CM | POA: Diagnosis not present

## 2015-09-19 DIAGNOSIS — N186 End stage renal disease: Secondary | ICD-10-CM | POA: Diagnosis not present

## 2015-09-19 DIAGNOSIS — E875 Hyperkalemia: Secondary | ICD-10-CM | POA: Diagnosis not present

## 2015-09-19 DIAGNOSIS — E1129 Type 2 diabetes mellitus with other diabetic kidney complication: Secondary | ICD-10-CM | POA: Diagnosis not present

## 2015-09-19 DIAGNOSIS — D509 Iron deficiency anemia, unspecified: Secondary | ICD-10-CM | POA: Diagnosis not present

## 2015-09-19 DIAGNOSIS — N2581 Secondary hyperparathyroidism of renal origin: Secondary | ICD-10-CM | POA: Diagnosis not present

## 2015-09-19 DIAGNOSIS — D631 Anemia in chronic kidney disease: Secondary | ICD-10-CM | POA: Diagnosis not present

## 2015-09-21 DIAGNOSIS — E875 Hyperkalemia: Secondary | ICD-10-CM | POA: Diagnosis not present

## 2015-09-21 DIAGNOSIS — N2581 Secondary hyperparathyroidism of renal origin: Secondary | ICD-10-CM | POA: Diagnosis not present

## 2015-09-21 DIAGNOSIS — D509 Iron deficiency anemia, unspecified: Secondary | ICD-10-CM | POA: Diagnosis not present

## 2015-09-21 DIAGNOSIS — E1129 Type 2 diabetes mellitus with other diabetic kidney complication: Secondary | ICD-10-CM | POA: Diagnosis not present

## 2015-09-21 DIAGNOSIS — N186 End stage renal disease: Secondary | ICD-10-CM | POA: Diagnosis not present

## 2015-09-21 DIAGNOSIS — D631 Anemia in chronic kidney disease: Secondary | ICD-10-CM | POA: Diagnosis not present

## 2015-09-23 DIAGNOSIS — E1129 Type 2 diabetes mellitus with other diabetic kidney complication: Secondary | ICD-10-CM | POA: Diagnosis not present

## 2015-09-23 DIAGNOSIS — D631 Anemia in chronic kidney disease: Secondary | ICD-10-CM | POA: Diagnosis not present

## 2015-09-23 DIAGNOSIS — N2581 Secondary hyperparathyroidism of renal origin: Secondary | ICD-10-CM | POA: Diagnosis not present

## 2015-09-23 DIAGNOSIS — E875 Hyperkalemia: Secondary | ICD-10-CM | POA: Diagnosis not present

## 2015-09-23 DIAGNOSIS — D509 Iron deficiency anemia, unspecified: Secondary | ICD-10-CM | POA: Diagnosis not present

## 2015-09-23 DIAGNOSIS — N186 End stage renal disease: Secondary | ICD-10-CM | POA: Diagnosis not present

## 2015-09-26 DIAGNOSIS — D631 Anemia in chronic kidney disease: Secondary | ICD-10-CM | POA: Diagnosis not present

## 2015-09-26 DIAGNOSIS — E1129 Type 2 diabetes mellitus with other diabetic kidney complication: Secondary | ICD-10-CM | POA: Diagnosis not present

## 2015-09-26 DIAGNOSIS — D509 Iron deficiency anemia, unspecified: Secondary | ICD-10-CM | POA: Diagnosis not present

## 2015-09-26 DIAGNOSIS — E875 Hyperkalemia: Secondary | ICD-10-CM | POA: Diagnosis not present

## 2015-09-26 DIAGNOSIS — N2581 Secondary hyperparathyroidism of renal origin: Secondary | ICD-10-CM | POA: Diagnosis not present

## 2015-09-26 DIAGNOSIS — N186 End stage renal disease: Secondary | ICD-10-CM | POA: Diagnosis not present

## 2015-09-28 DIAGNOSIS — D631 Anemia in chronic kidney disease: Secondary | ICD-10-CM | POA: Diagnosis not present

## 2015-09-28 DIAGNOSIS — E1129 Type 2 diabetes mellitus with other diabetic kidney complication: Secondary | ICD-10-CM | POA: Diagnosis not present

## 2015-09-28 DIAGNOSIS — N2581 Secondary hyperparathyroidism of renal origin: Secondary | ICD-10-CM | POA: Diagnosis not present

## 2015-09-28 DIAGNOSIS — E875 Hyperkalemia: Secondary | ICD-10-CM | POA: Diagnosis not present

## 2015-09-28 DIAGNOSIS — D509 Iron deficiency anemia, unspecified: Secondary | ICD-10-CM | POA: Diagnosis not present

## 2015-09-28 DIAGNOSIS — N186 End stage renal disease: Secondary | ICD-10-CM | POA: Diagnosis not present

## 2015-09-30 DIAGNOSIS — N2581 Secondary hyperparathyroidism of renal origin: Secondary | ICD-10-CM | POA: Diagnosis not present

## 2015-09-30 DIAGNOSIS — D631 Anemia in chronic kidney disease: Secondary | ICD-10-CM | POA: Diagnosis not present

## 2015-09-30 DIAGNOSIS — E1129 Type 2 diabetes mellitus with other diabetic kidney complication: Secondary | ICD-10-CM | POA: Diagnosis not present

## 2015-09-30 DIAGNOSIS — D509 Iron deficiency anemia, unspecified: Secondary | ICD-10-CM | POA: Diagnosis not present

## 2015-09-30 DIAGNOSIS — N186 End stage renal disease: Secondary | ICD-10-CM | POA: Diagnosis not present

## 2015-09-30 DIAGNOSIS — E875 Hyperkalemia: Secondary | ICD-10-CM | POA: Diagnosis not present

## 2015-10-01 ENCOUNTER — Emergency Department (HOSPITAL_COMMUNITY): Payer: Medicare Other

## 2015-10-01 ENCOUNTER — Encounter (HOSPITAL_COMMUNITY): Payer: Self-pay | Admitting: Emergency Medicine

## 2015-10-01 ENCOUNTER — Observation Stay (HOSPITAL_COMMUNITY)
Admission: EM | Admit: 2015-10-01 | Discharge: 2015-10-03 | Disposition: A | Discharge status: Home / Self Care | Payer: Medicare Other | Attending: Internal Medicine | Admitting: Internal Medicine

## 2015-10-01 DIAGNOSIS — R9431 Abnormal electrocardiogram [ECG] [EKG]: Secondary | ICD-10-CM

## 2015-10-01 DIAGNOSIS — IMO0001 Reserved for inherently not codable concepts without codable children: Secondary | ICD-10-CM

## 2015-10-01 DIAGNOSIS — R079 Chest pain, unspecified: Secondary | ICD-10-CM | POA: Diagnosis present

## 2015-10-01 DIAGNOSIS — I10 Essential (primary) hypertension: Secondary | ICD-10-CM | POA: Diagnosis present

## 2015-10-01 DIAGNOSIS — N189 Chronic kidney disease, unspecified: Secondary | ICD-10-CM

## 2015-10-01 DIAGNOSIS — N186 End stage renal disease: Secondary | ICD-10-CM | POA: Diagnosis not present

## 2015-10-01 DIAGNOSIS — E785 Hyperlipidemia, unspecified: Secondary | ICD-10-CM

## 2015-10-01 DIAGNOSIS — D638 Anemia in other chronic diseases classified elsewhere: Secondary | ICD-10-CM | POA: Diagnosis present

## 2015-10-01 DIAGNOSIS — R0789 Other chest pain: Secondary | ICD-10-CM | POA: Diagnosis not present

## 2015-10-01 DIAGNOSIS — Z992 Dependence on renal dialysis: Secondary | ICD-10-CM | POA: Diagnosis not present

## 2015-10-01 DIAGNOSIS — Z531 Procedure and treatment not carried out because of patient's decision for reasons of belief and group pressure: Secondary | ICD-10-CM

## 2015-10-01 DIAGNOSIS — Z7982 Long term (current) use of aspirin: Secondary | ICD-10-CM | POA: Insufficient documentation

## 2015-10-01 DIAGNOSIS — D631 Anemia in chronic kidney disease: Secondary | ICD-10-CM

## 2015-10-01 DIAGNOSIS — Z79899 Other long term (current) drug therapy: Secondary | ICD-10-CM | POA: Diagnosis not present

## 2015-10-01 DIAGNOSIS — I639 Cerebral infarction, unspecified: Secondary | ICD-10-CM | POA: Diagnosis present

## 2015-10-01 DIAGNOSIS — I309 Acute pericarditis, unspecified: Secondary | ICD-10-CM

## 2015-10-01 DIAGNOSIS — K219 Gastro-esophageal reflux disease without esophagitis: Secondary | ICD-10-CM | POA: Diagnosis present

## 2015-10-01 DIAGNOSIS — Z8673 Personal history of transient ischemic attack (TIA), and cerebral infarction without residual deficits: Secondary | ICD-10-CM | POA: Diagnosis present

## 2015-10-01 DIAGNOSIS — D649 Anemia, unspecified: Secondary | ICD-10-CM | POA: Diagnosis present

## 2015-10-01 DIAGNOSIS — I12 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 stage renal disease: Secondary | ICD-10-CM | POA: Diagnosis not present

## 2015-10-01 DIAGNOSIS — R0602 Shortness of breath: Secondary | ICD-10-CM | POA: Diagnosis not present

## 2015-10-01 DIAGNOSIS — Z87891 Personal history of nicotine dependence: Secondary | ICD-10-CM | POA: Diagnosis not present

## 2015-10-01 DIAGNOSIS — K5790 Diverticulosis of intestine, part unspecified, without perforation or abscess without bleeding: Secondary | ICD-10-CM | POA: Diagnosis present

## 2015-10-01 LAB — BASIC METABOLIC PANEL
Anion gap: 13 (ref 5–15)
BUN: 40 mg/dL — ABNORMAL HIGH (ref 6–20)
CO2: 28 mmol/L (ref 22–32)
Calcium: 10.2 mg/dL (ref 8.9–10.3)
Chloride: 94 mmol/L — ABNORMAL LOW (ref 101–111)
Creatinine, Ser: 9.94 mg/dL — ABNORMAL HIGH (ref 0.61–1.24)
GFR calc Af Amer: 6 mL/min — ABNORMAL LOW (ref >60)
GFR calc non Af Amer: 5 mL/min — ABNORMAL LOW (ref >60)
Glucose, Bld: 100 mg/dL — ABNORMAL HIGH (ref 65–99)
Potassium: 4.5 mmol/L (ref 3.5–5.1)
Sodium: 135 mmol/L (ref 135–145)

## 2015-10-01 LAB — CBC
HCT: 39.2 % (ref 39.0–52.0)
Hemoglobin: 12.3 g/dL — ABNORMAL LOW (ref 13.0–17.0)
MCH: 27.5 pg (ref 26.0–34.0)
MCHC: 31.4 g/dL (ref 30.0–36.0)
MCV: 87.7 fL (ref 78.0–100.0)
Platelets: 285 10*3/uL (ref 150–400)
RBC: 4.47 MIL/uL (ref 4.22–5.81)
RDW: 16.9 % — ABNORMAL HIGH (ref 11.5–15.5)
WBC: 8.2 10*3/uL (ref 4.0–10.5)

## 2015-10-01 LAB — I-STAT TROPONIN, ED: Troponin i, poc: 0 ng/mL (ref 0.00–0.08)

## 2015-10-01 MED ORDER — ASPIRIN 81 MG PO CHEW
324.0000 mg | CHEWABLE_TABLET | Freq: Once | ORAL | Status: AC
  Administered 2015-10-01: 324 mg via ORAL
  Filled 2015-10-01: qty 4

## 2015-10-01 MED ORDER — HYDROMORPHONE HCL 1 MG/ML IJ SOLN
0.3000 mg | Freq: Once | INTRAMUSCULAR | Status: AC
  Administered 2015-10-01: 0.3 mg via INTRAVENOUS
  Filled 2015-10-01: qty 1

## 2015-10-01 NOTE — ED Provider Notes (Signed)
[facility] DEPT Provider Note   CSN: [id number] Arrival date & time: 10/01/15  1535     History   Chief Complaint Chief Complaint  Patient presents with  . Chest Pain    HPI Donovon Middlemiss Sr. is a 55 y.o. male.  HPI Patient presents with concern of chest pain. Patient has been present for about 3 days, beginning without clear precipitant. Since onset pain has been persistent, with irritated sensation, sense of having to burp. With persistency of symptoms, but no substantial changes, the patient presents for evaluation. He denies dyspnea, lightheadedness, nausea, vomiting, fever, chills. Patient has multiple medical issues, including dialysis, last session yesterday.  Past Medical History:  Diagnosis Date  . Anemia   . Chronic headaches   . Chronic kidney disease    Chronic Renal Failure; On Renal Transplant List  . Diverticulitis   . GI bleed   . Hemodialysis patient ([place])    M-W-F  . Hypertension   . Presence of arteriovenous fistula for hemodialysis, primary ([place])    RUE  . Refusal of blood product    NO WHOLE BLOOD PROUCTS  . Sleep apnea   . Stroke [facility])     Patient Active Problem List   Diagnosis Date Noted  . 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 GI bleed 08/01/2015  . Acute blood loss anemia   . Acute diverticulitis   . Lung nodule   . Diverticulitis of cecum 07/21/2015  . Diverticulitis of large intestine without perforation or abscess without bleeding   . Right lower quadrant pain   . Headache 05/05/2015  . 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 Hypertension secondary to other renal disorders 08/26/2014  . Diverticulosis 07/13/2014  . Secondary hyperparathyroidism ([place]) 07/13/2014  . Lower GI bleed 07/12/2014  . Hematochezia   . Acute posthemorrhagic anemia   . End stage renal disease ([place])   . Refusal of blood transfusions as patient is Jehovah's Witness   . Parotid mass   . HLD (hyperlipidemia)   . PRES  (posterior reversible encephalopathy syndrome)   . Stroke ([place]) 05/23/2014  . CVA (cerebral infarction) 05/23/2014  . Anemia of renal disease 05/23/2014  . HTN (hypertension) 12/09/2012  . GERD (gastroesophageal reflux disease) 12/09/2012  . ESRD on dialysis ([place]) 12/09/2012  . Gastrointestinal bleeding 11/12/2012    Past Surgical History:  Procedure Laterality Date  . AV FISTULA PLACEMENT Right    right arm  . COLONOSCOPY N/A 08/04/2015   Procedure: COLONOSCOPY;  Surgeon: Jerene Bears, MD;  Location[facility] ENDOSCOPY;  Service: Endoscopy;  Laterality: N/A;  . graft left arm Left    for dialysis x 2  . INSERTION OF DIALYSIS CATHETER     Rt chest  . LAPAROSCOPIC RIGHT COLECTOMY N/A 08/05/2015   Procedure: LAPAROSCOPIC RIGHT COLECTOMY- ASCENDING;  Surgeon: Stark Klein, MD;  Location: [place];  Service: General;  Laterality: N/A;       Home Medications    Prior to Admission medications   Medication Sig Start Date End Date Taking? Authorizing Provider  aspirin EC 81 MG tablet Take 1 tablet (81 mg total) by mouth daily. 08/26/14  Yes Rosalin Hawking, MD  atorvastatin (LIPITOR) 10 MG tablet Take 1 tablet (10 mg total) by mouth daily at 6 PM. 05/25/14  Yes Thurnell Lose, MD  calcium acetate (PHOSLO) 667 MG capsule Take 2,001 mg by mouth 3 (three) times daily with meals.    Yes Historical Provider, MD  cinacalcet (SENSIPAR) 90 MG  tablet Take 1 tablet (90 mg total) by mouth 2 (two) times daily. 08/10/15  Yes Lavina Hamman, MD  docusate sodium (COLACE) 100 MG capsule Take 1 capsule (100 mg total) by mouth 2 (two) times daily as needed for mild constipation. 08/10/15  Yes Lavina Hamman, MD  folic acid-vitamin b complex-vitamin c-selenium-zinc (DIALYVITE) 3 MG TABS tablet Take 1 tablet by mouth daily.   Yes Historical Provider, MD  gabapentin (NEURONTIN) 100 MG capsule Take 100 mg by mouth 2 (two) times daily.  09/29/14  Yes Historical Provider, MD  lanthanum (FOSRENOL) 1000 MG chewable tablet Chew  1,000 mg by mouth 3 (three) times daily with meals.   Yes Historical Provider, MD  ondansetron (ZOFRAN) 4 MG tablet Take 1 tablet (4 mg total) by mouth every 6 (six) hours. 09/10/15  Yes Vira Blanco, MD  oxyCODONE-acetaminophen (PERCOCET/ROXICET) 5-325 MG tablet Take 1 tablet by mouth every 8 (eight) hours as needed for moderate pain. 09/10/15  Yes Vira Blanco, MD  sorbitol 70 % solution Take 15-60 mLs by mouth daily as needed (dialysis days).    Yes Historical Provider, MD    Family History Family History  Problem Relation Age of Onset  . Diabetes Father   . Stroke Father   . Hypertension Father   . Uterine cancer Mother   . Lupus Sister   . Stroke Sister   . Anuerysm Brother     brain  . Hypertension Sister     Social History Social History  Substance Use Topics  . Smoking status: Former Smoker    Quit date: 06/19/1990  . Smokeless tobacco: Never Used  . Alcohol use 0.6 oz/week    1 Cans of beer per week     Comment: occasional     Allergies   Infed [iron dextran]; Oxycodone; Vicodin [hydrocodone-acetaminophen]; Diclofenac; and Whole blood   Review of Systems Review of Systems  Constitutional:       Per HPI, otherwise negative  HENT:       Per HPI, otherwise negative  Respiratory:       Per HPI, otherwise negative  Cardiovascular:       Per HPI, otherwise negative  Gastrointestinal: Negative for vomiting.  Endocrine:       Negative aside from HPI  Genitourinary:       Neg aside from HPI   Musculoskeletal:       Per HPI, otherwise negative  Skin: Negative.   Allergic/Immunologic: Positive for immunocompromised state.  Neurological: Negative for syncope.     Physical Exam Updated Vital Signs BP 115/81   Pulse 93   Temp 97.7 F (36.5 C) (Oral)   Resp 19   Ht 6\' 1"  ([id number] m)   Wt 196 lb 3 oz (89 kg)   SpO2 98%   BMI 25.88 kg/m   Physical Exam  Constitutional: He is oriented to person, place, and time. He appears well-developed. No distress.    HENT:  Head: Normocephalic and atraumatic.  Eyes: Conjunctivae and EOM are normal.  Cardiovascular: Normal rate and regular rhythm.   Pulmonary/Chest: Effort normal. No stridor. No respiratory distress.  Abdominal: He exhibits no distension.  Musculoskeletal: He exhibits no edema.  Neurological: He is alert and oriented to person, place, and time.  Skin: Skin is warm and dry.  Right forearm dialysis access fistula palpable  Psychiatric: He has a normal mood and affect.  Nursing note and vitals reviewed.    ED Treatments / Results  Labs (all labs ordered  are listed, but only abnormal results are displayed) Labs Reviewed  BASIC METABOLIC PANEL - Abnormal; Notable for the following:       Result Value   Chloride 94 (*)    Glucose, Bld 100 (*)    BUN 40 (*)    Creatinine, Ser 9.94 (*)    GFR calc non Af Amer 5 (*)    GFR calc Af Amer 6 (*)    All other components within normal limits  CBC - Abnormal; Notable for the following:    Hemoglobin 12.3 (*)    RDW 16.9 (*)    All other components within normal limits  I-STAT TROPOININ, ED    EKG  EKG Interpretation  Date/Time:  Saturday October 01 2015 19:47:20 EDT Ventricular Rate:  89 PR Interval:    QRS Duration: 101 QT Interval:  370 QTC Calculation: 451 R Axis:   41 Text Interpretation:  Sinus rhythm Inferior infarct, acute (LCx) ST-t wave abnormality No significant change since last tracing Abnormal ekg Confirmed by Carmin Muskrat  MD [phone]) on 10/01/2015 7:55:10 PM      After the initial evaluation I discussed the patient's case with cardiology given the abnormal EKG. Initial troponin normal, patient in no distress, vital signs are unremarkable.  Radiology Dg Chest 2 View  Result Date: 10/01/2015 CLINICAL DATA:  Chest pain and shortness of breath for 3 days. Hypertension. Chronic kidney disease. EXAM: CHEST  2 VIEW COMPARISON:  08/01/2015 FINDINGS: Midline trachea. Mild cardiomegaly. Atherosclerosis in the  transverse aorta. No pleural effusion or pneumothorax. Clear lungs. No congestive failure. IMPRESSION: No acute cardiopulmonary disease. Aortic atherosclerosis. Cardiomegaly without congestive failure. Electronically Signed   By: Abigail Miyamoto M.D.   On: 10/01/2015 16:53    Procedures Procedures (including critical care time)   Initial Impression / Assessment and Plan / ED Course  I have reviewed the triage vital signs and the nursing notes.  Pertinent labs & imaging results that were available during my care of the patient were reviewed by me and considered in my medical decision making (see chart for details).  After discussing the patient's case again with cardiology, we agreed the patient to the hospitalist team.  Patient presents with ongoing chest pain. Patient has multiple medical issues, most prominently, end-stage renal disease, on dialysis.  Here the patient is awake and alert, but has abnormal EKG. Initial evaluation reassuring, but given the patient's comorbidities, the patient was admitted for further evaluation, management cardiology evaluation.   Carmin Muskrat, MD 10/01/15 2223

## 2015-10-01 NOTE — ED Notes (Signed)
Attempted PIV access x 1 in pt's left hand at this time without success.  Pt states that he does not wish to be stuck again at this time and wants to wait to see the MD to determine if he needs PIV access.

## 2015-10-01 NOTE — H&P (Signed)
History and Physical    Carl Alvelo Sr. [id number] DOB: August 03, 1960 DOA: 10/01/2015  PCP: Lucrezia Starch, MD  Nephrology: Dr. Maisie Fus HD: MWF  Patient coming from: Home  Chief Complaint: Chest pain  HPI: Carl Bernheim Sr. is a 55 y.o. gentleman with a history of ESRD on HD MWF, HTN, HLD, prior CVA, Pre-diabetes, and anemia of chronic renal disease who presents to the ED for evaluation of intermittent substernal chest pain that has been waxing and waning in intensity for a couple of days, and changes with position.  Sometimes, it's better lying flat but sometimes it's better sitting up.  He has had intermittent shortness of breath and light-headedness but no syncope.  He has night sweats.  No leg swelling.  At its worst, pain is 10 out of 10; currently he rates his chest pain 8 out of 10.  He does not have a history of CAD or prior MI.  ED Course: EKG shows rather diffuse ST elevations that are not in a specific pattern of injury.  First troponin negative.  Chest xray shows cardiomegaly but no evidence of CHF.  Cardiology has been consulted.  Hospitalist asked to admit.    I have ordered ASA 324 x 1 then resume baby aspirin daily.  IV dilaudid 0.3mg  x 1 (avoiding morphine since he is ESRD).  Held on NTG for now since blood pressure borderline and pericarditis is higher on the differential.  Review of Systems: As per HPI otherwise 10 point review of systems negative.    Past Medical History:  Diagnosis Date  . Anemia   . Chronic headaches   . Chronic kidney disease    Chronic Renal Failure; On Renal Transplant List  . Diverticulitis   . GI bleed   . Hemodialysis patient ([place])    M-W-F  . Hypertension   . Presence of arteriovenous fistula for hemodialysis, primary ([place])    RUE  . Refusal of blood product    NO WHOLE BLOOD PROUCTS  . Sleep apnea   . Stroke [facility])     Past Surgical History:  Procedure Laterality Date  . AV FISTULA PLACEMENT Right    right arm  .  COLONOSCOPY N/A 08/04/2015   Procedure: COLONOSCOPY;  Surgeon: Jerene Bears, MD;  Location: [facility] ENDOSCOPY;  Service: Endoscopy;  Laterality: N/A;  . graft left arm Left    for dialysis x 2  . INSERTION OF DIALYSIS CATHETER     Rt chest  . LAPAROSCOPIC RIGHT COLECTOMY N/A 08/05/2015   Procedure: LAPAROSCOPIC RIGHT COLECTOMY- ASCENDING;  Surgeon: Stark Klein, MD;  Location: [place];  Service: General;  Laterality: N/A;     reports that he quit smoking about 25 years ago. He has never used smokeless tobacco. He reports that he drinks about 0.6 oz of alcohol per week . He reports that he does not use drugs.  He is not married but he has adult children.  He considers his brother [name] his next of kin.  Allergies  Allergen Reactions  . Infed [Iron Dextran] Other (See Comments)    Decreased BP   . Oxycodone Nausea Only  . Vicodin [Hydrocodone-Acetaminophen] Other (See Comments)    Decreased BP  . Diclofenac Other (See Comments)    Unknown  . Whole Blood Other (See Comments)    Unk    Family History  Problem Relation Age of Onset  . Diabetes Father   . Stroke Father   . Hypertension Father   . Uterine cancer Mother   .  Lupus Sister   . Stroke Sister   . Anuerysm Brother     brain  . Hypertension Sister      Prior to Admission medications   Medication Sig Start Date End Date Taking? Authorizing Provider  aspirin EC 81 MG tablet Take 1 tablet (81 mg total) by mouth daily. 08/26/14  Yes Rosalin Hawking, MD  atorvastatin (LIPITOR) 10 MG tablet Take 1 tablet (10 mg total) by mouth daily at 6 PM. 05/25/14  Yes Thurnell Lose, MD  calcium acetate (PHOSLO) 667 MG capsule Take 2,001 mg by mouth 3 (three) times daily with meals.    Yes Historical Provider, MD  cinacalcet (SENSIPAR) 90 MG tablet Take 1 tablet (90 mg total) by mouth 2 (two) times daily. 08/10/15  Yes Lavina Hamman, MD  docusate sodium (COLACE) 100 MG capsule Take 1 capsule (100 mg total) by mouth 2 (two) times daily as needed for  mild constipation. 08/10/15  Yes Lavina Hamman, MD  folic acid-vitamin b complex-vitamin c-selenium-zinc (DIALYVITE) 3 MG TABS tablet Take 1 tablet by mouth daily.   Yes Historical Provider, MD  gabapentin (NEURONTIN) 100 MG capsule Take 100 mg by mouth 2 (two) times daily.  09/29/14  Yes Historical Provider, MD  lanthanum (FOSRENOL) 1000 MG chewable tablet Chew 1,000 mg by mouth 3 (three) times daily with meals.   Yes Historical Provider, MD  ondansetron (ZOFRAN) 4 MG tablet Take 1 tablet (4 mg total) by mouth every 6 (six) hours. 09/10/15  Yes Vira Blanco, MD  oxyCODONE-acetaminophen (PERCOCET/ROXICET) 5-325 MG tablet Take 1 tablet by mouth every 8 (eight) hours as needed for moderate pain. 09/10/15  Yes Vira Blanco, MD  sorbitol 70 % solution Take 15-60 mLs by mouth daily as needed (dialysis days).    Yes Historical Provider, MD    Physical Exam: Vitals:   10/01/15 2200 10/01/15 2215 10/01/15 2230 10/01/15 2245  BP: 117/84 112/86 121/84 118/88  Pulse: 89 93 98 99  Resp: 17 18 21 19   Temp:      TempSrc:      SpO2: 99% 98% 100% 98%  Weight:      Height:          Constitutional: NAD, calm, comfortable, NONtoxic appearing Vitals:   10/01/15 2200 10/01/15 2215 10/01/15 2230 10/01/15 2245  BP: 117/84 112/86 121/84 118/88  Pulse: 89 93 98 99  Resp: 17 18 21 19   Temp:      TempSrc:      SpO2: 99% 98% 100% 98%  Weight:      Height:       Eyes: PERRL, lids and conjunctivae normal ENMT: Mucous membranes are moist. Posterior pharynx clear of any exudate or lesions. Normal dentition.  Neck: normal appearance, supple Respiratory: clear to auscultation bilaterally, no wheezing, no crackles. Normal respiratory effort. No accessory muscle use.  Cardiovascular: Normal rate, regular rhythm.  I do not hear a murmur or rub.  No extremity edema. 2+ pedal pulses. GI: abdomen is soft and compressible.  No distention.  No tenderness.  No masses palpated.  Bowel sounds are  present. Musculoskeletal:  No joint deformity in upper and lower extremities. Good ROM, no contractures. Normal muscle tone.  Skin: no rashes, warm and dry Neurologic: CN 2-12 grossly intact. Sensation intact, Strength symmetric bilaterally, 5/5  Psychiatric: Normal judgment and insight. Alert and oriented x 3. Normal mood.     Labs on Admission: I have personally reviewed following labs and imaging studies  CBC:  Recent  Labs Lab 10/01/15 1542  WBC 8.2  HGB 12.3*  HCT 39.2  MCV 87.7  PLT [id number]   Basic Metabolic Panel:  Recent Labs Lab 10/01/15 1542  NA 135  K 4.5  CL 94*  CO2 28  GLUCOSE 100*  BUN 40*  CREATININE 9.94*  CALCIUM 10.2   GFR: Estimated Creatinine Clearance: 9.4 mL/min (by C-G formula based on SCr of 9.94 mg/dL).  Urine analysis:    Component Value Date/Time   COLORURINE YELLOW 08/06/2006 0806   APPEARANCEUR CLEAR 08/06/2006 0806   LABSPEC 1.013 08/06/2006 0806   PHURINE 8.0 08/06/2006 0806   GLUCOSEU 100 (A) 08/06/2006 0806   HGBUR MODERATE (A) 08/06/2006 0806   BILIRUBINUR NEGATIVE 08/06/2006 0806   KETONESUR NEGATIVE 08/06/2006 0806   PROTEINUR > 300 08/06/2006 0806   UROBILINOGEN 0.2 08/06/2006 0806   NITRITE NEGATIVE 08/06/2006 0806   LEUKOCYTESUR SMALL 08/06/2006 0806    Radiological Exams on Admission: Dg Chest 2 View  Result Date: 10/01/2015 CLINICAL DATA:  Chest pain and shortness of breath for 3 days. Hypertension. Chronic kidney disease. EXAM: CHEST  2 VIEW COMPARISON:  08/01/2015 FINDINGS: Midline trachea. Mild cardiomegaly. Atherosclerosis in the transverse aorta. No pleural effusion or pneumothorax. Clear lungs. No congestive failure. IMPRESSION: No acute cardiopulmonary disease. Aortic atherosclerosis. Cardiomegaly without congestive failure. Electronically Signed   By: Abigail Miyamoto M.D.   On: 10/01/2015 16:53    EKG: Independently reviewed. NSR.  ST elevations in II, aVF, and V6 and to a lesser degree in III and  V5  Assessment/Plan Active Problems:   HTN (hypertension)   GERD (gastroesophageal reflux disease)   ESRD on dialysis (H[facility])   Stroke (H[facility])   Anemia of renal disease   HLD (hyperlipidemia)   Refusal of blood transfusions as patient is Jehovah's Witness   Diverticulosis   Atypical chest pain   Abnormal EKG   Chest pain     Chest pain of unclear etiology.  He certainly has risk factors for CAD but diffuse EKG elevations would also be suggestive of pericarditis. --Cardiology consult pending and appreciated.  Will need to follow up their recommendations.  Deferring anticoagulation for now. --Full strength aspirin in ED then resume baby aspirin daily --A1c and fasting lipid panel for risk factor stratification --Continue statin --Serial troponin --echo in the AM --Repeat EKG upon arrival to the floor and in the AM --NPO after 4am for possible stress test  ESRD on HD --Not due for HD again until Monday --Continue Sensipar, Phoslo, Dialyvite, Lanthanum  DVT prophylaxis: SubQ heparin Code Status: FULL Family Communication: Patient alone at the time of admission Disposition Plan: Expect he will go home at discharge Consults called: Cardiology consulted from the ED Admission status: Observation, telemetry   TIME SPENT: 70 minutes   Eber Jones MD [facility] Pager [phone]  If 7PM-7AM, please contact night-coverage [email or web address] Password [email or web address]  10/01/2015, 11:06 PM

## 2015-10-01 NOTE — ED Triage Notes (Signed)
Pt c/o center chest pain x 3 days and lightheadedness.

## 2015-10-02 DIAGNOSIS — R0789 Other chest pain: Secondary | ICD-10-CM

## 2015-10-02 DIAGNOSIS — Z7982 Long term (current) use of aspirin: Secondary | ICD-10-CM | POA: Diagnosis not present

## 2015-10-02 DIAGNOSIS — N186 End stage renal disease: Secondary | ICD-10-CM | POA: Diagnosis not present

## 2015-10-02 DIAGNOSIS — R9431 Abnormal electrocardiogram [ECG] [EKG]: Secondary | ICD-10-CM

## 2015-10-02 DIAGNOSIS — Z8673 Personal history of transient ischemic attack (TIA), and cerebral infarction without residual deficits: Secondary | ICD-10-CM | POA: Diagnosis not present

## 2015-10-02 DIAGNOSIS — Z79899 Other long term (current) drug therapy: Secondary | ICD-10-CM | POA: Diagnosis not present

## 2015-10-02 DIAGNOSIS — I309 Acute pericarditis, unspecified: Secondary | ICD-10-CM

## 2015-10-02 DIAGNOSIS — Z87891 Personal history of nicotine dependence: Secondary | ICD-10-CM | POA: Diagnosis not present

## 2015-10-02 DIAGNOSIS — R079 Chest pain, unspecified: Secondary | ICD-10-CM | POA: Diagnosis not present

## 2015-10-02 DIAGNOSIS — I10 Essential (primary) hypertension: Secondary | ICD-10-CM

## 2015-10-02 DIAGNOSIS — K5710 Diverticulosis of small intestine without perforation or abscess without bleeding: Secondary | ICD-10-CM

## 2015-10-02 DIAGNOSIS — I12 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 stage renal disease: Secondary | ICD-10-CM | POA: Diagnosis not present

## 2015-10-02 DIAGNOSIS — Z992 Dependence on renal dialysis: Secondary | ICD-10-CM | POA: Diagnosis not present

## 2015-10-02 LAB — CREATININE, SERUM
Creatinine, Ser: 10.59 mg/dL — ABNORMAL HIGH (ref 0.61–1.24)
GFR calc Af Amer: 5 mL/min — ABNORMAL LOW (ref >60)
GFR calc non Af Amer: 5 mL/min — ABNORMAL LOW (ref >60)

## 2015-10-02 LAB — TROPONIN I
Troponin I: <0.03 ng/mL (ref <0.03)
Troponin I: <0.03 ng/mL (ref <0.03)
Troponin I: <0.03 ng/mL (ref <0.03)

## 2015-10-02 LAB — LIPID PANEL
Cholesterol: 129 mg/dL (ref 0–200)
HDL: 44 mg/dL (ref >40)
LDL Cholesterol: 72 mg/dL (ref 0–99)
Total CHOL/HDL Ratio: 2.9 RATIO
Triglycerides: 64 mg/dL (ref <150)
VLDL: 13 mg/dL (ref 0–40)

## 2015-10-02 MED ORDER — DOXERCALCIFEROL 4 MCG/2ML IV SOLN
3.0000 ug | INTRAVENOUS | Status: DC
  Administered 2015-10-03: 3 ug via INTRAVENOUS
  Filled 2015-10-02: qty 2

## 2015-10-02 MED ORDER — LANTHANUM CARBONATE 500 MG PO CHEW
3000.0000 mg | CHEWABLE_TABLET | Freq: Three times a day (TID) | ORAL | Status: DC
  Filled 2015-10-02: qty 6

## 2015-10-02 MED ORDER — NITROGLYCERIN 0.4 MG SL SUBL: 0.4000 mg | SUBLINGUAL_TABLET | SUBLINGUAL | Status: DC | PRN

## 2015-10-02 MED ORDER — CINACALCET HCL 30 MG PO TABS
180.0000 mg | ORAL_TABLET | Freq: Every day | ORAL | Status: DC
  Filled 2015-10-02 (×2): qty 6

## 2015-10-02 MED ORDER — ACETAMINOPHEN 325 MG PO TABS
650.0000 mg | ORAL_TABLET | ORAL | Status: DC | PRN
  Administered 2015-10-02 (×2): 650 mg via ORAL
  Filled 2015-10-02 (×2): qty 2

## 2015-10-02 MED ORDER — CINACALCET HCL 30 MG PO TABS: 180.0000 mg | ORAL_TABLET | Freq: Every day | ORAL | Status: DC

## 2015-10-02 MED ORDER — HEPARIN SODIUM (PORCINE) 5000 UNIT/ML IJ SOLN
5000.0000 [IU] | Freq: Three times a day (TID) | INTRAMUSCULAR | Status: DC
  Administered 2015-10-02 (×2): 5000 [IU] via SUBCUTANEOUS
  Filled 2015-10-02 (×2): qty 1

## 2015-10-02 MED ORDER — CINACALCET HCL 30 MG PO TABS
90.0000 mg | ORAL_TABLET | Freq: Two times a day (BID) | ORAL | Status: DC
  Administered 2015-10-02: 90 mg via ORAL
  Filled 2015-10-02 (×2): qty 3

## 2015-10-02 MED ORDER — LANTHANUM CARBONATE 500 MG PO CHEW
1000.0000 mg | CHEWABLE_TABLET | Freq: Three times a day (TID) | ORAL | Status: DC
  Administered 2015-10-02: 1000 mg via ORAL
  Filled 2015-10-02: qty 2

## 2015-10-02 MED ORDER — COLCHICINE 0.6 MG PO TABS
0.3000 mg | ORAL_TABLET | Freq: Every day | ORAL | Status: DC
  Administered 2015-10-02 (×2): 0.3 mg via ORAL
  Filled 2015-10-02 (×2): qty 1

## 2015-10-02 MED ORDER — DOCUSATE SODIUM 100 MG PO CAPS: 100.0000 mg | ORAL_CAPSULE | Freq: Two times a day (BID) | ORAL | Status: DC | PRN

## 2015-10-02 MED ORDER — CINACALCET HCL 30 MG PO TABS
90.0000 mg | ORAL_TABLET | Freq: Once | ORAL | Status: AC
  Administered 2015-10-02: 90 mg via ORAL
  Filled 2015-10-02: qty 3

## 2015-10-02 MED ORDER — OXYCODONE HCL 5 MG PO TABS
5.0000 mg | ORAL_TABLET | Freq: Four times a day (QID) | ORAL | Status: DC | PRN
  Administered 2015-10-02 – 2015-10-03 (×3): 5 mg via ORAL
  Filled 2015-10-02 (×3): qty 1

## 2015-10-02 MED ORDER — CALCIUM ACETATE (PHOS BINDER) 667 MG PO CAPS
3335.0000 mg | ORAL_CAPSULE | Freq: Three times a day (TID) | ORAL | Status: DC
  Administered 2015-10-02: 3335 mg via ORAL
  Filled 2015-10-02: qty 5

## 2015-10-02 MED ORDER — RENA-VITE PO TABS
1.0000 | ORAL_TABLET | Freq: Every day | ORAL | Status: DC
  Administered 2015-10-02 (×2): 1 via ORAL
  Filled 2015-10-02 (×2): qty 1

## 2015-10-02 MED ORDER — ATORVASTATIN CALCIUM 10 MG PO TABS
10.0000 mg | ORAL_TABLET | Freq: Every day | ORAL | Status: DC
  Administered 2015-10-02: 10 mg via ORAL
  Filled 2015-10-02: qty 1

## 2015-10-02 MED ORDER — CALCIUM ACETATE (PHOS BINDER) 667 MG PO CAPS
2001.0000 mg | ORAL_CAPSULE | Freq: Three times a day (TID) | ORAL | Status: DC
  Administered 2015-10-02: 2001 mg via ORAL
  Filled 2015-10-02: qty 3

## 2015-10-02 MED ORDER — ONDANSETRON HCL 4 MG/2ML IJ SOLN
4.0000 mg | Freq: Four times a day (QID) | INTRAMUSCULAR | Status: DC | PRN
  Administered 2015-10-02: 4 mg via INTRAVENOUS
  Filled 2015-10-02: qty 2

## 2015-10-02 MED ORDER — GABAPENTIN 100 MG PO CAPS
100.0000 mg | ORAL_CAPSULE | Freq: Two times a day (BID) | ORAL | Status: DC
  Administered 2015-10-02 (×3): 100 mg via ORAL
  Filled 2015-10-02 (×3): qty 1

## 2015-10-02 MED ORDER — ASPIRIN EC 81 MG PO TBEC
81.0000 mg | DELAYED_RELEASE_TABLET | Freq: Every day | ORAL | Status: DC
  Administered 2015-10-02: 81 mg via ORAL
  Filled 2015-10-02: qty 1

## 2015-10-02 NOTE — Progress Notes (Signed)
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 He has hemodialysis MWF at [facility]. 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 Anemia of chronic disease, SHPT. PATIENT IS JEHOVAH WITNESS but will accept blood products. Discuss with patient.    Patient presented to ED 10/01/2015 with C/O intermittent chest pain that is worse with inspiration, intermittent SOB and light headedness without syncope, night sweats. Has been admitted as observation patient for possible pericarditis. EKG showed diffuse ST segment elevations. Echo pending. Troponin 0.03 X 3. Cardiology has been consulted He has been started on colchicine 0.3 mg PO daily. At present, he is not complaining of chest pain, is sitting on side of bed in NAD.   Will enter hemodialysis for tomorrow per OP schedule. Wt 88.5 kg this AM-slightly above OP EDW. Will attempt UFG 1.5-2.5 liters tomorrow.   Dialysis orders: [facility] M,W,F 4.5 hours 500/Auto 1.5 87 kg 2.0 K/ 2.0 Ca  RUA AVF Heparin: 2700 units IV q tx however there is order in [facility] for No Heparin. Will hold heparin until order conflict resolved.  Mircera 150 mcg IV q 2 weeks (last dose 09/28/15 HGB 11.6 09/28/15) Hectoral 3 mcg IV q tx (Last PTH 468 09/07/15) Fosrenol 1000 mg 3 tabs PO TID w/meals. 1 tablet with snack Phoslo 667 mg 5 capsules PO TID w/meals Sensipar 90 mg 2 tablets PO daily  Please notify us if patient status changed to in-patient and we will consult formally.  Juanell Fairly, [facility] [phone] (Pager)

## 2015-10-02 NOTE — Progress Notes (Signed)
[facility] PROGRESS NOTE  Carl Rosencrance Sr. [id number] DOB: [birth year] DOA: 10/01/2015   PCP: Carl Starch, MD     Assessment/Plan: Active Problems:   HTN (hypertension)   GERD (gastroesophageal reflux disease)   ESRD on dialysis ([place])   Stroke ([place])   Anemia of renal disease   HLD (hyperlipidemia)   Refusal of blood transfusions as patient is Jehovah's Witness   Diverticulosis   Atypical chest pain   Abnormal EKG   Chest pain   Carl Quesnel Sr. is a 55 y.o. gentleman with a history of ESRD on HD MWF, HTN, HLD, prior CVA, Pre-diabetes, and anemia of chronic renal disease who presents to the ED for evaluation of intermittent substernal chest pain that has been waxing and waning in intensity for a couple of days, and changes with position.  Sometimes, it's better lying flat but sometimes it's better sitting up.  He has had intermittent shortness of breath and light-headedness but no syncope.  He has night sweats.  No leg swelling.  At its worst, pain is 10 out of 10; currently he rates his chest pain 8 out of 10.  He does not have a history of CAD or prior MI.  ED Course: EKG shows rather diffuse ST elevations that are not in a specific pattern of injury.  First troponin negative.  Chest xray shows cardiomegaly but no evidence of CHF.  Hospitalist asked to admit.      Assessment/Plan  1. Chest pain--->  cardiac enzymes negative, concern for acute pericarditis, 2-D echo ordered and pending,  2D echo to rule out pericardial effusion given poor renal function. ?uremic pericarditis  -Colchicine or NSAID's dosed appropriately given his ESRD.  Cardiology consulted, continue aspirin,Patient is a Jehova's Witness  2.ESRD secondary to HTN vs ANCA related dz but no bx due to Jehovan's Witness status) on HD for 9 years on a MWF status at [facility].--Not due for HD again until Monday --Continue Sensipar, Phoslo, Dialyvite, Lanthanum  3. Hypertension   -Blood pressure controlled at this time. Will monitor.   4. Anemia of chronic disease-hemoglobin stable at around 12.3  DVT prophylaxsis Lovenox  Code Status:  Full code  Family Communication: Discussed in detail with the patient, all imaging results, lab results explained to the patient   Disposition Plan:  Anticipate discharge after hemodialysis tomorrow      Consultants:  Nephrology  Cardiology  Procedures:  None  Antibiotics: Anti-infectives    None         HPI/Subjective: Denies any chest pain or shortness of breath  Objective: Vitals:   10/02/15 0000 10/02/15 0106 10/02/15 0357 10/02/15 0807  BP: 129/84 121/79 102/80 120/84  Pulse: 97 85 76 92  Resp: 22 20 20    Temp:  97.8 F (36.6 C) 98.4 F (36.9 C) 98.1 F (36.7 C)  TempSrc:  Oral Oral Oral  SpO2: 100% 99% 98% 100%  Weight:  88.5 kg (195 lb 3.2 oz)    Height:  6\' 1"  (1.854 m)     No intake or output data in the 24 hours ending 10/02/15 0854  Exam:  Examination:  General exam: Appears calm and comfortable  Respiratory system: Clear to auscultation. Respiratory effort normal. Cardiovascular system: S1 & S2 heard, RRR. No JVD, murmurs, rubs, gallops or clicks. No pedal edema. Gastrointestinal system: Abdomen is nondistended, soft and nontender. No organomegaly or masses felt. Normal bowel sounds heard. Central nervous system: Alert and oriented. No focal neurological  deficits. Extremities: Symmetric 5 x 5 power. Skin: No rashes, lesions or ulcers Psychiatry: Judgement and insight appear normal. Mood & affect appropriate.     Data Reviewed: I have personally reviewed following labs and imaging studies  Micro Results No results found for this or any previous visit (from the past 240 hour(s)).  Radiology Reports Ct Abdomen Pelvis Wo Contrast  Result Date: 09/10/2015 CLINICAL DATA:  Right lower quadrant abdominal pain for 1 day. Nausea. Recent laparoscopic abdominal surgery for  diverticulitis. Recent dialysis. History of partial colectomy and appendectomy on June 23rd. EXAM: CT ABDOMEN AND PELVIS WITHOUT CONTRAST TECHNIQUE: Multidetector CT imaging of the abdomen and pelvis was performed following the standard protocol without IV contrast. COMPARISON:  CT abdomen dated 08/09/2015 and CT abdomen dated 08/01/2015. FINDINGS: Lower chest:  No acute findings. Hepatobiliary: The liver and gallbladder appear within normal limits for a noncontrast exam. Pancreas: No mass or inflammatory process identified on this un-enhanced exam. Spleen: Within normal limits in size. Adrenals/Urinary Tract: Adrenal glands appear normal. Numerous renal cysts again noted bilaterally indicating polycystic kidney disease. No obstructing renal or ureteral calculi. Bladder is decompressed. Stomach/Bowel: Extensive diverticulosis again noted throughout the colon. There is new thickening of the walls of the lower sigmoid colon with surrounding paracolic inflammation suggesting acute diverticulitis. Additional milder paracolic inflammation noted about the upper sigmoid colon and lower descending colon, raising the alternative possibility of an acute colitis. No paracolic abscess collection seen. There are surgical changes of new partial colectomy (ascending colon). No obstruction, fluid collection or inflammation at the surgical anastomosis. No dilated large or small bowel loops. Vascular/Lymphatic: Abdominal aorta is normal in caliber. No enlarged lymph nodes seen. Reproductive: No mass or other significant abnormality. Other: No abscess collection.  No free intraperitoneal air. Musculoskeletal: Scoliosis of the thoracolumbar spine. Scattered degenerative changes throughout the spine, mild to moderate in degree, and at each hip joint. No acute or suspicious osseous finding. Superficial soft tissues are unremarkable. Expected postsurgical changes within the midline abdomen. IMPRESSION: 1. New thickening of the walls of  the mid sigmoid colon (approximately 7 cm segment of the mid sigmoid colon) with surrounding paracolic inflammation suggesting acute diverticulitis. Additional milder paracolic inflammation noted about the upper sigmoid colon and lower descending colon, raising the alternative possibility of an acute colitis, less likely 2 concomitant foci of acute diverticulitis. No paracolic abscess collection seen. No free intraperitoneal air. No associated bowel obstruction. 2. Surgical changes of recent partial right colectomy. No evidence of surgical complicating feature. No obstruction, fluid collection or inflammatory change at the surgical anastomosis. Electronically Signed   By: Franki Cabot M.D.   On: 09/10/2015 20:38  Dg Chest 2 View  Result Date: 10/01/2015 CLINICAL DATA:  Chest pain and shortness of breath for 3 days. Hypertension. Chronic kidney disease. EXAM: CHEST  2 VIEW COMPARISON:  08/01/2015 FINDINGS: Midline trachea. Mild cardiomegaly. Atherosclerosis in the transverse aorta. No pleural effusion or pneumothorax. Clear lungs. No congestive failure. IMPRESSION: No acute cardiopulmonary disease. Aortic atherosclerosis. Cardiomegaly without congestive failure. Electronically Signed   By: Abigail Miyamoto M.D.   On: 10/01/2015 16:53     CBC  Recent Labs Lab 10/01/15 1542  WBC 8.2  HGB 12.3*  HCT 39.2  PLT 285  MCV 87.7  MCH 27.5  MCHC 31.4  RDW 16.9*    Chemistries   Recent Labs Lab 10/01/15 1542 10/02/15 0034  NA 135  --   K 4.5  --   CL 94*  --  CO2 28  --   GLUCOSE 100*  --   BUN 40*  --   CREATININE 9.94* 10.59*  CALCIUM 10.2  --    ------------------------------------------------------------------------------------------------------------------ estimated creatinine clearance is 8.8 mL/min (by C-G formula based on SCr of 10.59 mg/dL). ------------------------------------------------------------------------------------------------------------------ No results for input(s):  HGBA1C in the last 72 hours. ------------------------------------------------------------------------------------------------------------------  Recent Labs  10/02/15 0347  CHOL 129  HDL 44  LDLCALC 72  TRIG 64  CHOLHDL 2.9   ------------------------------------------------------------------------------------------------------------------ No results for input(s): TSH, T4TOTAL, T3FREE, THYROIDAB in the last 72 hours.  Invalid input(s): FREET3 ------------------------------------------------------------------------------------------------------------------ No results for input(s): VITAMINB12, FOLATE, FERRITIN, TIBC, IRON, RETICCTPCT in the last 72 hours.  Coagulation profile No results for input(s): INR, PROTIME in the last 168 hours.  No results for input(s): DDIMER in the last 72 hours.  Cardiac Enzymes  Recent Labs Lab 10/02/15 0034 10/02/15 0347 10/02/15 0621  TROPONINI <0.03 <0.03 <0.03   ------------------------------------------------------------------------------------------------------------------ Invalid input(s): POCBNP   CBG: No results for input(s): GLUCAP in the last 168 hours.     Studies: Dg Chest 2 View  Result Date: 10/01/2015 CLINICAL DATA:  Chest pain and shortness of breath for 3 days. Hypertension. Chronic kidney disease. EXAM: CHEST  2 VIEW COMPARISON:  08/01/2015 FINDINGS: Midline trachea. Mild cardiomegaly. Atherosclerosis in the transverse aorta. No pleural effusion or pneumothorax. Clear lungs. No congestive failure. IMPRESSION: No acute cardiopulmonary disease. Aortic atherosclerosis. Cardiomegaly without congestive failure. Electronically Signed   By: Abigail Miyamoto M.D.   On: 10/01/2015 16:53      Lab Results  Component Value Date   HGBA1C 5.4 05/23/2014   Lab Results  Component Value Date   LDLCALC 72 10/02/2015   CREATININE 10.59 (H) 10/02/2015       Scheduled Meds: . aspirin EC  81 mg Oral Daily  . atorvastatin  10 mg Oral  q1800  . calcium acetate  2,001 mg Oral TID WC  . cinacalcet  90 mg Oral BID WC  . colchicine  0.3 mg Oral Daily  . gabapentin  100 mg Oral BID  . heparin  5,000 Units Subcutaneous Q8H  . lanthanum  1,000 mg Oral TID WC  . multivitamin  1 tablet Oral QHS   Continuous Infusions:    LOS: 0 days    Time spent: >30 MINS    [facility]  [facility] Pager [phone]. If 7PM-7AM, please contact night-coverage at [email or web address], password [email or web address] 10/02/2015, 8:54 AM  LOS: 0 days

## 2015-10-02 NOTE — Progress Notes (Signed)
Patient Name: Carl Edgecomb Sr. Date of Encounter: 10/02/2015  Active Problems:   HTN (hypertension)   GERD (gastroesophageal reflux disease)   ESRD on dialysis ([place])   Stroke (HCC)   Anemia of renal disease   HLD (hyperlipidemia)   Refusal of blood transfusions as patient is Jehovah's Witness   Diverticulosis   Atypical chest pain   Abnormal EKG   Chest pain   Primary Cardiologist: Dr. Debara Pickett Patient Profile: 55 year old male with past medical history of ESRD on HD (M,W,F), anemia, hypertension, and OSA presented to the emergency department with a chief complaint of chest pain. The patient states the chest pain is worse with inspiration and when laying down but better when leaning forward. He denies radiation of the pain or complaints of palpitations. The pain is always reproducible near the left chest wall. In the emergency department, an ECG demonstrated diffuse ST segment elevations.   SUBJECTIVE: Feels ok, still has some chest pressure.    OBJECTIVE Vitals:   10/02/15 0000 10/02/15 0106 10/02/15 0357 10/02/15 0807  BP: 129/84 121/79 102/80 120/84  Pulse: 97 85 76 92  Resp: 22 20 20    Temp:  97.8 F (36.6 C) 98.4 F (36.9 C) 98.1 F (36.7 C)  TempSrc:  Oral Oral Oral  SpO2: 100% 99% 98% 100%  Weight:  195 lb 3.2 oz (88.5 kg)    Height:  6\' 1"  (1.854 m)     No intake or output data in the 24 hours ending 10/02/15 0836 Filed Weights   10/01/15 1539 10/02/15 0106  Weight: 196 lb 3 oz (89 kg) 195 lb 3.2 oz (88.5 kg)    PHYSICAL EXAM General: Well developed, well nourished, male in no acute distress. Head: Normocephalic, atraumatic.  Neck: Supple without bruits, no JVD. Lungs:  Resp regular and unlabored, CTA. Heart: RRR, S1, S2, no S3, S4, or murmur; no rub. Abdomen: Soft, non-tender, non-distended, BS + x 4.  Extremities: No clubbing, cyanosis, no edema.  Neuro: Alert and oriented X 3. Moves all extremities spontaneously. Psych: Normal  affect.  LABS: CBC: Recent Labs  10/01/15 1542  WBC 8.2  HGB 12.3*  HCT 39.2  MCV 87.7  PLT [id number]   Basic Metabolic Panel: Recent Labs  10/01/15 1542 10/02/15 0034  NA 135  --   K 4.5  --   CL 94*  --   CO2 28  --   GLUCOSE 100*  --   BUN 40*  --   CREATININE 9.94* 10.59*  CALCIUM 10.2  --    Cardiac Enzymes: Recent Labs  10/02/15 0034 10/02/15 0347 10/02/15 0621  TROPONINI <0.03 <0.03 <0.03    Recent Labs  10/01/15 1547  TROPIPOC 0.00    Fasting Lipid Panel: Recent Labs  10/02/15 0347  CHOL 129  HDL 44  LDLCALC 72  TRIG 64  CHOLHDL 2.9     Current Facility-Administered Medications:  .  acetaminophen (TYLENOL) tablet 650 mg, 650 mg, Oral, Q4H PRN, Lily Kocher, MD, 650 mg at 10/02/15 0641 .  aspirin EC tablet 81 mg, 81 mg, Oral, Daily, Lily Kocher, MD .  atorvastatin (LIPITOR) tablet 10 mg, 10 mg, Oral, q1800, Lily Kocher, MD .  calcium acetate (PHOSLO) capsule 2,001 mg, 2,001 mg, Oral, TID WC, Lily Kocher, MD .  cinacalcet [facility]) tablet 90 mg, 90 mg, Oral, BID WC, Lily Kocher, MD .  colchicine tablet 0.3 mg, 0.3 mg, Oral, Daily, Lily Kocher, MD, 0.3 mg at 10/02/15 0411 .  docusate sodium (COLACE) capsule 100 mg, 100 mg, Oral, BID PRN, Lily Kocher, MD .  gabapentin (NEURONTIN) capsule 100 mg, 100 mg, Oral, BID, Lily Kocher, MD, 100 mg at 10/02/15 0129 .  heparin injection 5,000 Units, 5,000 Units, Subcutaneous, Q8H, Lily Kocher, MD .  lanthanum [facility]) chewable tablet 1,000 mg, 1,000 mg, Oral, TID WC, Lily Kocher, MD .  multivitamin (RENA-VIT) tablet 1 tablet, 1 tablet, Oral, QHS, Lily Kocher, MD, 1 tablet at 10/02/15 0129 .  nitroGLYCERIN (NITROSTAT) SL tablet 0.4 mg, 0.4 mg, Sublingual, Q5 min PRN, Lily Kocher, MD .  ondansetron [facility]) injection 4 mg, 4 mg, Intravenous, Q6H PRN, Lily Kocher, MD   TELE: NSR       ECG: NSR, diffuse ST elevation  Radiology/Studies: Dg Chest 2 View  Result Date: 10/01/2015 CLINICAL DATA:   Chest pain and shortness of breath for 3 days. Hypertension. Chronic kidney disease. EXAM: CHEST  2 VIEW COMPARISON:  08/01/2015 FINDINGS: Midline trachea. Mild cardiomegaly. Atherosclerosis in the transverse aorta. No pleural effusion or pneumothorax. Clear lungs. No congestive failure. IMPRESSION: No acute cardiopulmonary disease. Aortic atherosclerosis. Cardiomegaly without congestive failure. Electronically Signed   By: Abigail Miyamoto M.D.   On: 10/01/2015 16:53     Current Medications:  . aspirin EC  81 mg Oral Daily  . atorvastatin  10 mg Oral q1800  . calcium acetate  2,001 mg Oral TID WC  . cinacalcet  90 mg Oral BID WC  . colchicine  0.3 mg Oral Daily  . gabapentin  100 mg Oral BID  . heparin  5,000 Units Subcutaneous Q8H  . lanthanum  1,000 mg Oral TID WC  . multivitamin  1 tablet Oral QHS      ASSESSMENT AND PLAN: Active Problems:   HTN (hypertension)   GERD (gastroesophageal reflux disease)   ESRD on dialysis ([place])   Stroke (HCC)   Anemia of renal disease   HLD (hyperlipidemia)   Refusal of blood transfusions as patient is Jehovah's Witness   Diverticulosis   Atypical chest pain   Abnormal EKG   Chest pain  1. Chest pain: Presentation consistent with pericarditis, troponin is negative x 3. 2D echo is pending for today. He was started on colchicine last night. Continue ASA.   2. ESRD on HD: per primary team.   3. HTN: Normotensive. Not on any anti-hypertensives  4. HLD: LDL is 72 this admission, continue statin.    Signed, Arbutus Leas , NP 8:36 AM 10/02/2015 Pager [phone]

## 2015-10-02 NOTE — H&P (Signed)
Cardiology Consult    Patient ID: Carl Sobieck Sr. MRN: [id number], DOB/AGE: Oct 12, 1960   Admit date: 10/01/2015 Date of Consult: 10/02/2015  Primary Physician: Lucrezia Starch, MD Reason for Consult: Chest Pain Requesting Provider: ED  History of Present Illness    55 year old male with past medical history of ESRD on HD (M,W,F), anemia, hypertension, and OSA presented to the emergency department with a chief complaint of chest pain. The patient states the chest pain is worse with inspiration and when laying down but better when leaning forward. He denies radiation of the pain or complaints of palpitations. The pain is always reproducible near the left chest wall. In the emergency department, an ECG demonstrated diffuse ST segment elevations.   Past Medical History   Past Medical History:  Diagnosis Date  . Anemia   . Chronic headaches   . Chronic kidney disease    Chronic Renal Failure; On Renal Transplant List  . Diverticulitis   . GI bleed   . Hemodialysis patient ([place])    M-W-F  . Hypertension   . Presence of arteriovenous fistula for hemodialysis, primary ([place])    RUE  . Refusal of blood product    NO WHOLE BLOOD PROUCTS  . Sleep apnea   . Stroke [facility])     Past Surgical History:  Procedure Laterality Date  . AV FISTULA PLACEMENT Right    right arm  . COLONOSCOPY N/A 08/04/2015   Procedure: COLONOSCOPY;  Surgeon: Jerene Bears, MD;  Location: [facility] ENDOSCOPY;  Service: Endoscopy;  Laterality: N/A;  . graft left arm Left    for dialysis x 2  . INSERTION OF DIALYSIS CATHETER     Rt chest  . LAPAROSCOPIC RIGHT COLECTOMY N/A 08/05/2015   Procedure: LAPAROSCOPIC RIGHT COLECTOMY- ASCENDING;  Surgeon: Stark Klein, MD;  Location: [place];  Service: General;  Laterality: N/A;     Allergies  Allergies  Allergen Reactions  . Infed [Iron Dextran] Other (See Comments)    Decreased BP   . Oxycodone Nausea Only  . Vicodin [Hydrocodone-Acetaminophen] Other (See Comments)      Decreased BP  . Diclofenac Other (See Comments)    Unknown  . Whole Blood Other (See Comments)    Unk    Inpatient Medications      Family History    Family History  Problem Relation Age of Onset  . Diabetes Father   . Stroke Father   . Hypertension Father   . Uterine cancer Mother   . Lupus Sister   . Stroke Sister   . Anuerysm Brother     brain  . Hypertension Sister     Social History    Social History   Social History  . Marital status: Single    Spouse name: N/A  . Number of children: 2  . Years of education: N/A   Occupational History  . retired Sports coach Employed   Social History Main Topics  . Smoking status: Former Smoker    Quit date: 06/19/1990  . Smokeless tobacco: Never Used  . Alcohol use 0.6 oz/week    1 Cans of beer per week     Comment: occasional  . Drug use: No  . Sexual activity: Not on file   Other Topics Concern  . Not on file   Social History Narrative   Lives alone at home   12 years of education    2 children    Not married  Review of Systems    General:  No chills, fever, night sweats or weight changes.  Cardiovascular:  No chest pain, dyspnea on exertion, edema, orthopnea, palpitations, paroxysmal nocturnal dyspnea. Dermatological: No rash, lesions/masses Respiratory: No cough, dyspnea Urologic: No hematuria, dysuria Abdominal:   No nausea, vomiting, diarrhea, bright red blood per rectum, melena, or hematemesis Neurologic:  No visual changes, wkns, changes in mental status. All other systems reviewed and are otherwise negative except as noted above.  Physical Exam    Blood pressure 118/80, pulse 93, temperature 97.7 F (36.5 C), temperature source Oral, resp. rate 19, height 6\' 1"  (1.854 m), weight 196 lb 3 oz (89 kg), SpO2 100 %.  GEN: NAD, A&Ox3 HEENT: NCAT, EOMI, No JVD CV: RRR, +S1/S2, No m/r/g's appreciated.   Pulm: CTA bilaterally  Abdomen: +BS, NTND Extremities: No c/c/e, 2+ pulses, +thril at AVF.   Neuro: No focal neurological deficits, CN II-XII Grossly intact.    Labs    Troponin [facility] Test)  Recent Labs  10/01/15 1547  TROPIPOC 0.00   No results for input(s): CKTOTAL, CKMB, TROPONINI in the last 72 hours. Lab Results  Component Value Date   WBC 8.2 10/01/2015   HGB 12.3 (L) 10/01/2015   HCT 39.2 10/01/2015   MCV 87.7 10/01/2015   PLT 285 10/01/2015    Recent Labs Lab 10/01/15 1542  NA 135  K 4.5  CL 94*  CO2 28  BUN 40*  CREATININE 9.94*  CALCIUM 10.2  GLUCOSE 100*   Lab Results  Component Value Date   CHOL 159 01/18/2015   HDL 63 01/18/2015   LDLCALC 81 01/18/2015   TRIG 75 01/18/2015   No results found for: [facility]   Radiology Studies    Ct Abdomen Pelvis Wo Contrast  Result Date: 09/10/2015 CLINICAL DATA:  Right lower quadrant abdominal pain for 1 day. Nausea. Recent laparoscopic abdominal surgery for diverticulitis. Recent dialysis. History of partial colectomy and appendectomy on June 23rd. EXAM: CT ABDOMEN AND PELVIS WITHOUT CONTRAST TECHNIQUE: Multidetector CT imaging of the abdomen and pelvis was performed following the standard protocol without IV contrast. COMPARISON:  CT abdomen dated 08/09/2015 and CT abdomen dated 08/01/2015. FINDINGS: Lower chest:  No acute findings. Hepatobiliary: The liver and gallbladder appear within normal limits for a noncontrast exam. Pancreas: No mass or inflammatory process identified on this un-enhanced exam. Spleen: Within normal limits in size. Adrenals/Urinary Tract: Adrenal glands appear normal. Numerous renal cysts again noted bilaterally indicating polycystic kidney disease. No obstructing renal or ureteral calculi. Bladder is decompressed. Stomach/Bowel: Extensive diverticulosis again noted throughout the colon. There is new thickening of the walls of the lower sigmoid colon with surrounding paracolic inflammation suggesting acute diverticulitis. Additional milder paracolic inflammation noted about the  upper sigmoid colon and lower descending colon, raising the alternative possibility of an acute colitis. No paracolic abscess collection seen. There are surgical changes of new partial colectomy (ascending colon). No obstruction, fluid collection or inflammation at the surgical anastomosis. No dilated large or small bowel loops. Vascular/Lymphatic: Abdominal aorta is normal in caliber. No enlarged lymph nodes seen. Reproductive: No mass or other significant abnormality. Other: No abscess collection.  No free intraperitoneal air. Musculoskeletal: Scoliosis of the thoracolumbar spine. Scattered degenerative changes throughout the spine, mild to moderate in degree, and at each hip joint. No acute or suspicious osseous finding. Superficial soft tissues are unremarkable. Expected postsurgical changes within the midline abdomen. IMPRESSION: 1. New thickening of the walls of the mid sigmoid colon (approximately  7 cm segment of the mid sigmoid colon) with surrounding paracolic inflammation suggesting acute diverticulitis. Additional milder paracolic inflammation noted about the upper sigmoid colon and lower descending colon, raising the alternative possibility of an acute colitis, less likely 2 concomitant foci of acute diverticulitis. No paracolic abscess collection seen. No free intraperitoneal air. No associated bowel obstruction. 2. Surgical changes of recent partial right colectomy. No evidence of surgical complicating feature. No obstruction, fluid collection or inflammatory change at the surgical anastomosis. Electronically Signed   By: Franki Cabot M.D.   On: 09/10/2015 20:38  Dg Chest 2 View  Result Date: 10/01/2015 CLINICAL DATA:  Chest pain and shortness of breath for 3 days. Hypertension. Chronic kidney disease. EXAM: CHEST  2 VIEW COMPARISON:  08/01/2015 FINDINGS: Midline trachea. Mild cardiomegaly. Atherosclerosis in the transverse aorta. No pleural effusion or pneumothorax. Clear lungs. No congestive  failure. IMPRESSION: No acute cardiopulmonary disease. Aortic atherosclerosis. Cardiomegaly without congestive failure. Electronically Signed   By: Abigail Miyamoto M.D.   On: 10/01/2015 16:53    EKG & Cardiac Imaging    EKG: ST at 110 beats per minute with diffuse ST segment elevations consistent with acute pericarditis.  Echocardiogram 05/24/14 - Left ventricle: The cavity size was normal. Systolic function was normal. The estimated ejection fraction was in the range of 55% to 60%. Wall motion was normal; there were no regional wall motion abnormalities. Doppler parameters are consistent with abnormal left ventricular relaxation (grade 1 diastolic dysfunction). - Pericardium, extracardiac: A trivial pericardial effusion was identified.  Assessment & Plan    55 year old male with past medical history as stated above presents with chest pain.  1. Chest pain---> most likely consistent with acute pericarditis. The patient's cardiac enzymes have been negative thus far.  -Check 2D echo to rule out pericardial effusion given poor renal function. ?uremic pericarditis  -Colchicine or NSAID's dosed appropriately given his ESRD. No further ischemic work up needed at this time.  -Continue statin and ASA.   2. ESRD on HD -Plan per primary team.  3. Hypertension  -Blood pressure controlled at this time. Will monitor.     Signed, [place] Cell: [phone]

## 2015-10-02 NOTE — ED Notes (Signed)
Report called to Charlett Nose, RN at this time.  Receiving nurse denies having any further questions at this time.

## 2015-10-03 ENCOUNTER — Observation Stay (HOSPITAL_BASED_OUTPATIENT_CLINIC_OR_DEPARTMENT_OTHER): Payer: Medicare Other

## 2015-10-03 DIAGNOSIS — R072 Precordial pain: Secondary | ICD-10-CM | POA: Diagnosis not present

## 2015-10-03 DIAGNOSIS — K219 Gastro-esophageal reflux disease without esophagitis: Secondary | ICD-10-CM | POA: Diagnosis not present

## 2015-10-03 DIAGNOSIS — K5730 Diverticulosis of large intestine without perforation or abscess without bleeding: Secondary | ICD-10-CM | POA: Diagnosis not present

## 2015-10-03 DIAGNOSIS — R079 Chest pain, unspecified: Secondary | ICD-10-CM

## 2015-10-03 DIAGNOSIS — R0789 Other chest pain: Secondary | ICD-10-CM | POA: Diagnosis not present

## 2015-10-03 LAB — COMPREHENSIVE METABOLIC PANEL
ALT: 9 U/L — ABNORMAL LOW (ref 17–63)
AST: 12 U/L — ABNORMAL LOW (ref 15–41)
Albumin: 2.7 g/dL — ABNORMAL LOW (ref 3.5–5.0)
Alkaline Phosphatase: 62 U/L (ref 38–126)
Anion gap: 19 — ABNORMAL HIGH (ref 5–15)
BUN: 62 mg/dL — ABNORMAL HIGH (ref 6–20)
CO2: 26 mmol/L (ref 22–32)
Calcium: 10.1 mg/dL (ref 8.9–10.3)
Chloride: 89 mmol/L — ABNORMAL LOW (ref 101–111)
Creatinine, Ser: 13.27 mg/dL — ABNORMAL HIGH (ref 0.61–1.24)
GFR calc Af Amer: 4 mL/min — ABNORMAL LOW (ref >60)
GFR calc non Af Amer: 4 mL/min — ABNORMAL LOW (ref >60)
Glucose, Bld: 86 mg/dL (ref 65–99)
Potassium: 5.1 mmol/L (ref 3.5–5.1)
Sodium: 134 mmol/L — ABNORMAL LOW (ref 135–145)
Total Bilirubin: 0.6 mg/dL (ref 0.3–1.2)
Total Protein: 7.7 g/dL (ref 6.5–8.1)

## 2015-10-03 LAB — HEMOGLOBIN A1C
Hgb A1c MFr Bld: 4.8 % (ref 4.8–5.6)
Mean Plasma Glucose: 91 mg/dL

## 2015-10-03 LAB — CBC
HCT: 39.3 % (ref 39.0–52.0)
Hemoglobin: 12 g/dL — ABNORMAL LOW (ref 13.0–17.0)
MCH: 26.5 pg (ref 26.0–34.0)
MCHC: 30.5 g/dL (ref 30.0–36.0)
MCV: 86.9 fL (ref 78.0–100.0)
Platelets: 267 10*3/uL (ref 150–400)
RBC: 4.52 MIL/uL (ref 4.22–5.81)
RDW: 17.1 % — ABNORMAL HIGH (ref 11.5–15.5)
WBC: 5.5 10*3/uL (ref 4.0–10.5)

## 2015-10-03 LAB — RENAL FUNCTION PANEL
Albumin: 2.6 g/dL — ABNORMAL LOW (ref 3.5–5.0)
Anion gap: 14 (ref 5–15)
BUN: 62 mg/dL — ABNORMAL HIGH (ref 6–20)
CO2: 29 mmol/L (ref 22–32)
Calcium: 9.9 mg/dL (ref 8.9–10.3)
Chloride: 90 mmol/L — ABNORMAL LOW (ref 101–111)
Creatinine, Ser: 13.03 mg/dL — ABNORMAL HIGH (ref 0.61–1.24)
GFR calc Af Amer: 4 mL/min — ABNORMAL LOW (ref >60)
GFR calc non Af Amer: 4 mL/min — ABNORMAL LOW (ref >60)
Glucose, Bld: 84 mg/dL (ref 65–99)
Phosphorus: 8.3 mg/dL — ABNORMAL HIGH (ref 2.5–4.6)
Potassium: 5.4 mmol/L — ABNORMAL HIGH (ref 3.5–5.1)
Sodium: 133 mmol/L — ABNORMAL LOW (ref 135–145)

## 2015-10-03 LAB — ECHOCARDIOGRAM COMPLETE
Height: 73 in
Weight: 3061.75 oz

## 2015-10-03 MED ORDER — SODIUM CHLORIDE 0.9 % IV SOLN: 100.0000 mL | INTRAVENOUS | Status: DC | PRN

## 2015-10-03 MED ORDER — DOXERCALCIFEROL 4 MCG/2ML IV SOLN
INTRAVENOUS | Status: AC
  Filled 2015-10-03: qty 2

## 2015-10-03 MED ORDER — LIDOCAINE-PRILOCAINE 2.5-2.5 % EX CREA: 1.0000 "application " | TOPICAL_CREAM | CUTANEOUS | Status: DC | PRN

## 2015-10-03 MED ORDER — COLCHICINE 0.6 MG PO TABS
0.3000 mg | ORAL_TABLET | Freq: Every day | ORAL | 0 refills | Status: DC
Start: 2015-10-03 — End: 2015-12-03

## 2015-10-03 MED ORDER — HEPARIN SODIUM (PORCINE) 1000 UNIT/ML DIALYSIS: 1000.0000 [IU] | INTRAMUSCULAR | Status: DC | PRN

## 2015-10-03 MED ORDER — ALTEPLASE 2 MG IJ SOLR: 2.0000 mg | Freq: Once | INTRAMUSCULAR | Status: DC | PRN

## 2015-10-03 MED ORDER — PENTAFLUOROPROP-TETRAFLUOROETH EX AERO: 1.0000 "application " | INHALATION_SPRAY | CUTANEOUS | Status: DC | PRN

## 2015-10-03 MED ORDER — LIDOCAINE HCL (PF) 1 % IJ SOLN: 5.0000 mL | INTRAMUSCULAR | Status: DC | PRN

## 2015-10-03 NOTE — Procedures (Signed)
  I was present at this dialysis session, have reviewed the session itself and made  appropriate changes Kelly Splinter MD [place] pager [phone]    cell [phone] 10/03/2015, 1:24 PM

## 2015-10-03 NOTE — Progress Notes (Signed)
[facility] Progress Note  Assessment/Plan: 1. Chest pain - thought to be secondary to pericarditis, 2 D Echo pending -hx of lower GERD and lower GIB large benign obstructing ulcer s/p colectomy 07/2015. Given that and JW hx would avoid NSAIDs as long as pain is controlled with current pain meds (which it is).  2. ESRD - MWF HD today  K 5.1 -hoding hepari 3. Anemia - hgb 12 - no ESA for now; pt is JW 4. Secondary hyperparathyroidism - fosrenol/hectorol/sensipar 5. HTN/volume - controlled- he has been leaving 0.5 - 1 kg below edw - needs lowering at d/c 6. Nutrition - renal/carb mod/vit  Myriam Jacobson, PA-C [place] [phone] 10/03/2015,8:29 AM  LOS: 0 days   Pt seen, examined and agree w A/P as above.  Kelly Splinter MD [facility] pager [phone]    cell [phone] 10/03/2015, 1:23 PM    Subjective:   Still with some pain  Objective Vitals:   10/03/15 0505 10/03/15 0800 10/03/15 0805 10/03/15 0810  BP: 121/75 105/73 106/70 111/75  Pulse: 85 72 71 68  Resp: 18 12    Temp: 98.4 F (36.9 C) 97.9 F (36.6 C)    TempSrc: Oral Oral    SpO2: 97% 98%    Weight: 87.8 kg (193 lb 8 oz) 88.2 kg (194 lb 7.1 oz)    Height:       Physical Exam General: NAD on HD Heart: RRR no rub Lungs:  No rales Abdomen: soft NT Extremities: no LE edema Dialysis Access: right upper AVF Qb 500  Dialysis Orders: [place] M,W,F 4.5 hours 500/Auto 1.5 87 kg 2.0 K/ 2.0 Ca  RUA AVF Heparin: 2700 units IV q tx however there is order in [facility] for No Heparin. Will hold heparin until order conflict resolved.  Mircera 150 mcg IV q 2 weeks (last dose 09/28/15 HGB 11.6 09/28/15) Hectoral 3 mcg IV q tx (Last PTH 468 09/07/15) Fosrenol 1000 mg 3 tabs PO TID w/meals. 1 tablet with snack Phoslo 667 mg 5 capsules PO TID w/meals Sensipar 90 mg 2 tablets PO daily  Additional Objective Labs: Basic Metabolic Panel:  Recent Labs Lab 10/01/15 1542  10/02/15 0034 10/03/15 0623  NA 135  --  134*  K 4.5  --  5.1  CL 94*  --  89*  CO2 28  --  26  GLUCOSE 100*  --  86  BUN 40*  --  62*  CREATININE 9.94* 10.59* 13.27*  CALCIUM 10.2  --  10.1   Liver Function Tests:  Recent Labs Lab 10/03/15 0623  AST 12*  ALT 9*  ALKPHOS 62  BILITOT 0.6  PROT 7.7  ALBUMIN 2.7*   CBC:  Recent Labs Lab 10/01/15 1542 10/03/15 0623  WBC 8.2 5.5  HGB 12.3* 12.0*  HCT 39.2 39.3  MCV 87.7 86.9  PLT 285 267    Cardiac Enzymes:  Recent Labs Lab 10/02/15 0034 10/02/15 0347 10/02/15 0621  TROPONINI <0.03 <0.03 <0.03  Medications:   . aspirin EC  81 mg Oral Daily  . atorvastatin  10 mg Oral q1800  . calcium acetate  3,335 mg Oral TID WC  . cinacalcet  180 mg Oral Q supper  . colchicine  0.3 mg Oral Daily  . doxercalciferol  3 mcg Intravenous Q M,W,F-HD  . gabapentin  100 mg Oral BID  . heparin  5,000 Units Subcutaneous Q8H  . lanthanum  3,000 mg Oral TID WC  . multivitamin  1  tablet Oral QHS

## 2015-10-03 NOTE — Discharge Summary (Addendum)
Physician Discharge Summary  Carl Robarts Sr. MRN: [id number] DOB/AGE: Sep 14, 1960 55 y.o.  PCP: Lucrezia Starch, MD   Admit date: 10/01/2015 Discharge date: 10/03/2015  Discharge Diagnoses:    Active Problems:   HTN (hypertension)   GERD (gastroesophageal reflux disease)   ESRD on dialysis ([place])   Stroke (HCC)   Anemia of renal disease   HLD (hyperlipidemia)   Refusal of blood transfusions as patient is Jehovah's Witness   Diverticulosis   Atypical chest pain   Abnormal EKG   Chest pain   Acute pericarditis    Follow-up recommendations Follow-up with PCP in 3-5 days , including all  additional recommended appointments as below Follow-up CBC, CMP in 3-5 days       Current Discharge Medication List    START taking these medications   Details  colchicine 0.6 MG tablet Take 0.5 tablets (0.3 mg total) by mouth daily. Qty: 15 tablet, Refills: 0      CONTINUE these medications which have NOT CHANGED   Details  aspirin EC 81 MG tablet Take 1 tablet (81 mg total) by mouth daily. Qty: 90 tablet, Refills: 3   Associated Diagnoses: Stroke [facility]); PRES (posterior reversible encephalopathy syndrome)    atorvastatin (LIPITOR) 10 MG tablet Take 1 tablet (10 mg total) by mouth daily at 6 PM. Qty: 30 tablet, Refills: 0    calcium acetate (PHOSLO) 667 MG capsule Take 2,001 mg by mouth 3 (three) times daily with meals.     cinacalcet (SENSIPAR) 90 MG tablet Take 1 tablet (90 mg total) by mouth 2 (two) times daily. Qty: 120 tablet, Refills: 0    docusate sodium (COLACE) 100 MG capsule Take 1 capsule (100 mg total) by mouth 2 (two) times daily as needed for mild constipation. Qty: 10 capsule, Refills: 0    folic acid-vitamin b complex-vitamin c-selenium-zinc (DIALYVITE) 3 MG TABS tablet Take 1 tablet by mouth daily.    gabapentin (NEURONTIN) 100 MG capsule Take 100 mg by mouth 2 (two) times daily.     lanthanum (FOSRENOL) 1000 MG chewable tablet Chew 1,000 mg by mouth 3  (three) times daily with meals.    ondansetron (ZOFRAN) 4 MG tablet Take 1 tablet (4 mg total) by mouth every 6 (six) hours. Qty: 12 tablet, Refills: 0    oxyCODONE-acetaminophen (PERCOCET/ROXICET) 5-325 MG tablet Take 1 tablet by mouth every 8 (eight) hours as needed for moderate pain. Qty: 15 tablet, Refills: 0    sorbitol 70 % solution Take 15-60 mLs by mouth daily as needed (dialysis days).          Discharge Condition: Stable    Discharge Instructions Get Medicines reviewed and adjusted: Please take all your medications with you for your next visit with your Primary MD  Please request your Primary MD to go over all hospital tests and procedure/radiological results at the follow up, please ask your Primary MD to get all Hospital records sent to his/her office.  If you experience worsening of your admission symptoms, develop shortness of breath, life threatening emergency, suicidal or homicidal thoughts you must seek medical attention immediately by calling 911 or calling your MD immediately if symptoms less severe.  You must read complete instructions/literature along with all the possible adverse reactions/side effects for all the Medicines you take and that have been prescribed to you. Take any new Medicines after you have completely understood and accpet all the possible adverse reactions/side effects.   Do not drive when taking Pain medications.   Do not  take more than prescribed Pain, Sleep and Anxiety Medications  Special Instructions: If you have smoked or chewed Tobacco in the last 2 yrs please stop smoking, stop any regular Alcohol and or any Recreational drug use.  Wear Seat belts while driving.  Please note  You were cared for by a hospitalist during your hospital stay. Once you are discharged, your primary care physician will handle any further medical issues. Please note that NO REFILLS for any discharge medications will be authorized once you are discharged,  as it is imperative that you return to your primary care physician (or establish a relationship with a primary care physician if you do not have one) for your aftercare needs so that they can reassess your need for medications and monitor your lab values.     Allergies  Allergen Reactions  . Infed [Iron Dextran] Other (See Comments)    Decreased BP   . Oxycodone Nausea Only  . Vicodin [Hydrocodone-Acetaminophen] Other (See Comments)    Decreased BP  . Diclofenac Other (See Comments)    Unknown  . Whole Blood Other (See Comments)    Unk      Disposition: 01-Home or Self Care   Consults:  Cardiology Nephrology    Significant Diagnostic Studies:  Ct Abdomen Pelvis Wo Contrast  Result Date: 09/10/2015 CLINICAL DATA:  Right lower quadrant abdominal pain for 1 day. Nausea. Recent laparoscopic abdominal surgery for diverticulitis. Recent dialysis. History of partial colectomy and appendectomy on June 23rd. EXAM: CT ABDOMEN AND PELVIS WITHOUT CONTRAST TECHNIQUE: Multidetector CT imaging of the abdomen and pelvis was performed following the standard protocol without IV contrast. COMPARISON:  CT abdomen dated 08/09/2015 and CT abdomen dated 08/01/2015. FINDINGS: Lower chest:  No acute findings. Hepatobiliary: The liver and gallbladder appear within normal limits for a noncontrast exam. Pancreas: No mass or inflammatory process identified on this un-enhanced exam. Spleen: Within normal limits in size. Adrenals/Urinary Tract: Adrenal glands appear normal. Numerous renal cysts again noted bilaterally indicating polycystic kidney disease. No obstructing renal or ureteral calculi. Bladder is decompressed. Stomach/Bowel: Extensive diverticulosis again noted throughout the colon. There is new thickening of the walls of the lower sigmoid colon with surrounding paracolic inflammation suggesting acute diverticulitis. Additional milder paracolic inflammation noted about the upper sigmoid colon and lower  descending colon, raising the alternative possibility of an acute colitis. No paracolic abscess collection seen. There are surgical changes of new partial colectomy (ascending colon). No obstruction, fluid collection or inflammation at the surgical anastomosis. No dilated large or small bowel loops. Vascular/Lymphatic: Abdominal aorta is normal in caliber. No enlarged lymph nodes seen. Reproductive: No mass or other significant abnormality. Other: No abscess collection.  No free intraperitoneal air. Musculoskeletal: Scoliosis of the thoracolumbar spine. Scattered degenerative changes throughout the spine, mild to moderate in degree, and at each hip joint. No acute or suspicious osseous finding. Superficial soft tissues are unremarkable. Expected postsurgical changes within the midline abdomen. IMPRESSION: 1. New thickening of the walls of the mid sigmoid colon (approximately 7 cm segment of the mid sigmoid colon) with surrounding paracolic inflammation suggesting acute diverticulitis. Additional milder paracolic inflammation noted about the upper sigmoid colon and lower descending colon, raising the alternative possibility of an acute colitis, less likely 2 concomitant foci of acute diverticulitis. No paracolic abscess collection seen. No free intraperitoneal air. No associated bowel obstruction. 2. Surgical changes of recent partial right colectomy. No evidence of surgical complicating feature. No obstruction, fluid collection or inflammatory change at the surgical anastomosis. Electronically  Signed   By: Franki Cabot M.D.   On: 09/10/2015 20:38  Dg Chest 2 View  Result Date: 10/01/2015 CLINICAL DATA:  Chest pain and shortness of breath for 3 days. Hypertension. Chronic kidney disease. EXAM: CHEST  2 VIEW COMPARISON:  08/01/2015 FINDINGS: Midline trachea. Mild cardiomegaly. Atherosclerosis in the transverse aorta. No pleural effusion or pneumothorax. Clear lungs. No congestive failure. IMPRESSION: No acute  cardiopulmonary disease. Aortic atherosclerosis. Cardiomegaly without congestive failure. Electronically Signed   By: Abigail Miyamoto M.D.   On: 10/01/2015 16:53    2-D echo   ------------------------------------------------------------------- LV EF: 60% -   65%  ------------------------------------------------------------------- Indications:      Chest pain 786.51.  ------------------------------------------------------------------- History:   PMH:  Chest pain consistent with pericarditis. ESRD on HD  Risk factors:  Hypertension. Dyslipidemia.  ------------------------------------------------------------------- Study Conclusions  - Left ventricle: The cavity size was normal. Wall thickness was   increased in a pattern of mild LVH. There was moderate focal   basal hypertrophy of the septum. Systolic function was normal.   The estimated ejection fraction was in the range of 60% to 65%.   Wall motion was normal; there were no regional wall motion   abnormalities. Doppler parameters are consistent with abnormal   left ventricular relaxation (grade 1 diastolic dysfunction). The   E/e&' ratio is <8, suggesting normal LV filling pressure. - Left atrium: The atrium was mildly dilated. - Right atrium: The atrium was mildly dilated. - Inferior vena cava: The vessel was normal in size. The   respirophasic diameter changes were in the normal range (>= 50%),   consistent with normal central venous pressure.  Impressions:  - Compared to a prior study in 2016, the LVEF is higher at 60-65%.   There is mild LVH with moderate basal septal hypertrophy, normal   wall motion and mild biatrial enlargement.   Filed Weights   10/02/15 0106 10/03/15 0505 10/03/15 0800  Weight: 88.5 kg (195 lb 3.2 oz) 87.8 kg (193 lb 8 oz) 88.2 kg (194 lb 7.1 oz)     Microbiology: No results found for this or any previous visit (from the past 240 hour(s)).     Blood Culture    Component Value Date/Time    SDES BLOOD LEFT HAND 08/01/2015 0624   SPECREQUEST BOTTLES DRAWN AEROBIC AND ANAEROBIC  5CC 08/01/2015 0624   CULT NO GROWTH 5 DAYS 08/01/2015 0624   REPTSTATUS 08/06/2015 FINAL 08/01/2015 0624      Labs: Results for orders placed or performed during the hospital encounter of 10/01/15 (from the past 48 hour(s))  Basic metabolic panel     Status: Abnormal   Collection Time: 10/01/15  3:42 PM  Result Value Ref Range   Sodium 135 135 - 145 mmol/L   Potassium 4.5 3.5 - 5.1 mmol/L   Chloride 94 (L) 101 - 111 mmol/L   CO2 28 22 - 32 mmol/L   Glucose, Bld 100 (H) 65 - 99 mg/dL   BUN 40 (H) 6 - 20 mg/dL   Creatinine, Ser 9.94 (H) 0.61 - 1.24 mg/dL   Calcium 10.2 8.9 - 10.3 mg/dL   GFR calc non Af Amer 5 (L) >60 mL/min   GFR calc Af Amer 6 (L) >60 mL/min    Comment: (NOTE) The eGFR has been calculated using the CKD EPI equation. This calculation has not been validated in all clinical situations. eGFR's persistently <60 mL/min signify possible Chronic Kidney Disease.    Anion gap 13 5 - 15  CBC     Status: Abnormal   Collection Time: 10/01/15  3:42 PM  Result Value Ref Range   WBC 8.2 4.0 - 10.5 K/uL   RBC 4.47 4.22 - 5.81 MIL/uL   Hemoglobin 12.3 (L) 13.0 - 17.0 g/dL   HCT 39.2 39.0 - 52.0 %   MCV 87.7 78.0 - 100.0 fL   MCH 27.5 26.0 - 34.0 pg   MCHC 31.4 30.0 - 36.0 g/dL   RDW 16.9 (H) 11.5 - 15.5 %   Platelets 285 150 - 400 K/uL  I-stat troponin, ED     Status: None   Collection Time: 10/01/15  3:47 PM  Result Value Ref Range   Troponin i, poc 0.00 0.00 - 0.08 ng/mL   Comment 3            Comment: Due to the release kinetics of cTnI, a negative result within the first hours of the onset of symptoms does not rule out myocardial infarction with certainty. If myocardial infarction is still suspected, repeat the test at appropriate intervals.   Troponin I-serum (0, 3, 6 hours)     Status: None   Collection Time: 10/02/15 12:34 AM  Result Value Ref Range   Troponin I  <0.03 <0.03 ng/mL  Creatinine, serum     Status: Abnormal   Collection Time: 10/02/15 12:34 AM  Result Value Ref Range   Creatinine, Ser 10.59 (H) 0.61 - 1.24 mg/dL   GFR calc non Af Amer 5 (L) >60 mL/min   GFR calc Af Amer 5 (L) >60 mL/min    Comment: (NOTE) The eGFR has been calculated using the CKD EPI equation. This calculation has not been validated in all clinical situations. eGFR's persistently <60 mL/min signify possible Chronic Kidney Disease.   Troponin I-serum (0, 3, 6 hours)     Status: None   Collection Time: 10/02/15  3:47 AM  Result Value Ref Range   Troponin I <0.03 <0.03 ng/mL  Hemoglobin A1c     Status: None   Collection Time: 10/02/15  3:47 AM  Result Value Ref Range   Hgb A1c MFr Bld 4.8 4.8 - 5.6 %    Comment: (NOTE)         Pre-diabetes: 5.7 - 6.4         Diabetes: >6.4         Glycemic control for adults with diabetes: <7.0    Mean Plasma Glucose 91 mg/dL    Comment: (NOTE) Performed At: [facility] [place], [place] [id number] Lindon Romp MD [id number]   Lipid panel     Status: None   Collection Time: 10/02/15  3:47 AM  Result Value Ref Range   Cholesterol 129 0 - 200 mg/dL   Triglycerides 64 <150 mg/dL   HDL 44 >40 mg/dL   Total CHOL/HDL Ratio 2.9 RATIO   VLDL 13 0 - 40 mg/dL   LDL Cholesterol 72 0 - 99 mg/dL    Comment:        Total Cholesterol/HDL:CHD Risk Coronary Heart Disease Risk Table                     Men   Women  1/2 Average Risk   3.4   3.3  Average Risk       5.0   4.4  2 X Average Risk   9.6   7.1  3 X Average Risk  23.4   11.0  Use the calculated Patient Ratio above and the CHD Risk Table to determine the patient's CHD Risk.        ATP III CLASSIFICATION (LDL):  <100     mg/dL   Optimal  100-129  mg/dL   Near or Above                    Optimal  130-159  mg/dL   Borderline  160-189  mg/dL   High  >190     mg/dL   Very High   Troponin I-serum (0, 3, 6 hours)     Status: None    Collection Time: 10/02/15  6:21 AM  Result Value Ref Range   Troponin I <0.03 <0.03 ng/mL  CBC     Status: Abnormal   Collection Time: 10/03/15  6:23 AM  Result Value Ref Range   WBC 5.5 4.0 - 10.5 K/uL   RBC 4.52 4.22 - 5.81 MIL/uL   Hemoglobin 12.0 (L) 13.0 - 17.0 g/dL   HCT 39.3 39.0 - 52.0 %   MCV 86.9 78.0 - 100.0 fL   MCH 26.5 26.0 - 34.0 pg   MCHC 30.5 30.0 - 36.0 g/dL   RDW 17.1 (H) 11.5 - 15.5 %   Platelets 267 150 - 400 K/uL  Comprehensive metabolic panel     Status: Abnormal   Collection Time: 10/03/15  6:23 AM  Result Value Ref Range   Sodium 134 (L) 135 - 145 mmol/L   Potassium 5.1 3.5 - 5.1 mmol/L   Chloride 89 (L) 101 - 111 mmol/L   CO2 26 22 - 32 mmol/L   Glucose, Bld 86 65 - 99 mg/dL   BUN 62 (H) 6 - 20 mg/dL   Creatinine, Ser 13.27 (H) 0.61 - 1.24 mg/dL   Calcium 10.1 8.9 - 10.3 mg/dL   Total Protein 7.7 6.5 - 8.1 g/dL   Albumin 2.7 (L) 3.5 - 5.0 g/dL   AST 12 (L) 15 - 41 U/L   ALT 9 (L) 17 - 63 U/L   Alkaline Phosphatase 62 38 - 126 U/L   Total Bilirubin 0.6 0.3 - 1.2 mg/dL   GFR calc non Af Amer 4 (L) >60 mL/min   GFR calc Af Amer 4 (L) >60 mL/min    Comment: (NOTE) The eGFR has been calculated using the CKD EPI equation. This calculation has not been validated in all clinical situations. eGFR's persistently <60 mL/min signify possible Chronic Kidney Disease.    Anion gap 19 (H) 5 - 15     Lipid Panel     Component Value Date/Time   CHOL 129 10/02/2015 0347   TRIG 64 10/02/2015 0347   HDL 44 10/02/2015 0347   CHOLHDL 2.9 10/02/2015 0347   VLDL 13 10/02/2015 0347   LDLCALC 72 10/02/2015 0347     Lab Results  Component Value Date   HGBA1C 4.8 10/02/2015   HGBA1C 5.4 05/23/2014     Lab Results  Component Value Date   LDLCALC 72 10/02/2015   CREATININE 13.27 (H) 10/03/2015     HPI :  Carl Seder Sr. is a 55 y.o. gentleman with a history of ESRD on HD MWF, HTN, HLD, prior CVA, Pre-diabetes, and anemia of chronic renal disease  who presents to the ED for evaluation of intermittent substernal chest pain that has been waxing and waning in intensity for a couple of days, and changes with position.  Sometimes, it's better lying flat but sometimes it's better sitting up.  He has had intermittent shortness of breath and light-headedness but no syncope.  He has night sweats.  No leg swelling.  At its worst, pain is 10 out of 10; currently he rates his chest pain 8 out of 10.  He does not have a history of CAD or prior MI.  ED Course: EKG shows rather diffuse ST elevations that are not in a specific pattern of injury.   cardiac enzymes negative.  Chest xray shows cardiomegaly but no evidence of CHF.  Cardiology has been consulted.     I have ordered ASA 324 x 1 then resume baby aspirin daily.  IV dilaudid 0.20m x 1 (avoiding morphine since he is ESRD).  Held on NTG for now since blood pressure borderline and pericarditis is higher on the differential.  HOSPITAL COURSE:    1. Chest pain---> cardiac enzymes negative, concern for acute pericarditis, 2-D echo ordered and pending,  2D echo negative for  pericardial effusion . ?uremic pericarditis .EKG showed diffuse ST segment elevations -Colchicine initiated a low-dose given end-stage renal disease ,Consider NSAIDs if ok with renal Cardiology consulted, continue aspirin,    2.ESRD secondary to HTN vs ANCA related dz but no bx due to Jehovan's Witness status) on HD for 9 years [facility]Patient received hemodialysis on Monday 8/21 --Continue Sensipar, Phoslo, Dialyvite, Lanthanum  3. Hypertension  -Blood pressure controlled at this time. Will monitor.   4. 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 rate 12,  height 6' 1" (1.854 m), weight 88.2 kg (194 lb 7.1 oz), SpO2 98 %.   Respiratory: clear to auscultation bilaterally, no wheezing, no crackles. Normal respiratory effort. No accessory muscle use.  Cardiovascular: Normal rate, regular rhythm.  I do not hear a murmur or rub.  No extremity edema. 2+ pedal pulses. GI: abdomen is soft and compressible.  No distention.  No tenderness.  No masses palpated.  Bowel sounds are present. Musculoskeletal:  No joint deformity in upper and lower extremities. Good ROM, no contractures. Normal muscle tone.  Skin: no rashes, warm and dry Neurologic: CN 2-12 grossly intact. Sensation intact, Strength symmetric bilaterally, 5/5  Psychiatric: Normal judgment and insight. Alert and oriented x 3. Normal mood.    Follow-up Information    DUNHAM,CYNTHIA B, MD. Schedule an appointment as soon as possible for a visit in 3 day(s).   Specialty:  Nephrology Why:  Hospital follow-up, please schedule this appointment as soon as possible Contact information: [place] [phone]          Signed: AReyne Dumas8/21/2017, 10:09 AM        Time spent >45 mins

## 2015-10-05 DIAGNOSIS — D631 Anemia in chronic kidney disease: Secondary | ICD-10-CM | POA: Diagnosis not present

## 2015-10-05 DIAGNOSIS — E1129 Type 2 diabetes mellitus with other diabetic kidney complication: Secondary | ICD-10-CM | POA: Diagnosis not present

## 2015-10-05 DIAGNOSIS — N186 End stage renal disease: Secondary | ICD-10-CM | POA: Diagnosis not present

## 2015-10-05 DIAGNOSIS — N2581 Secondary hyperparathyroidism of renal origin: Secondary | ICD-10-CM | POA: Diagnosis not present

## 2015-10-05 DIAGNOSIS — E875 Hyperkalemia: Secondary | ICD-10-CM | POA: Diagnosis not present

## 2015-10-05 DIAGNOSIS — D509 Iron deficiency anemia, unspecified: Secondary | ICD-10-CM | POA: Diagnosis not present

## 2015-10-07 DIAGNOSIS — D631 Anemia in chronic kidney disease: Secondary | ICD-10-CM | POA: Diagnosis not present

## 2015-10-07 DIAGNOSIS — N2581 Secondary hyperparathyroidism of renal origin: Secondary | ICD-10-CM | POA: Diagnosis not present

## 2015-10-07 DIAGNOSIS — E875 Hyperkalemia: Secondary | ICD-10-CM | POA: Diagnosis not present

## 2015-10-07 DIAGNOSIS — D509 Iron deficiency anemia, unspecified: Secondary | ICD-10-CM | POA: Diagnosis not present

## 2015-10-07 DIAGNOSIS — N186 End stage renal disease: Secondary | ICD-10-CM | POA: Diagnosis not present

## 2015-10-07 DIAGNOSIS — E1129 Type 2 diabetes mellitus with other diabetic kidney complication: Secondary | ICD-10-CM | POA: Diagnosis not present

## 2015-10-10 DIAGNOSIS — N186 End stage renal disease: Secondary | ICD-10-CM | POA: Diagnosis not present

## 2015-10-10 DIAGNOSIS — E875 Hyperkalemia: Secondary | ICD-10-CM | POA: Diagnosis not present

## 2015-10-10 DIAGNOSIS — D509 Iron deficiency anemia, unspecified: Secondary | ICD-10-CM | POA: Diagnosis not present

## 2015-10-10 DIAGNOSIS — E1129 Type 2 diabetes mellitus with other diabetic kidney complication: Secondary | ICD-10-CM | POA: Diagnosis not present

## 2015-10-10 DIAGNOSIS — N2581 Secondary hyperparathyroidism of renal origin: Secondary | ICD-10-CM | POA: Diagnosis not present

## 2015-10-10 DIAGNOSIS — D631 Anemia in chronic kidney disease: Secondary | ICD-10-CM | POA: Diagnosis not present

## 2015-10-12 DIAGNOSIS — N2581 Secondary hyperparathyroidism of renal origin: Secondary | ICD-10-CM | POA: Diagnosis not present

## 2015-10-12 DIAGNOSIS — N186 End stage renal disease: Secondary | ICD-10-CM | POA: Diagnosis not present

## 2015-10-12 DIAGNOSIS — E875 Hyperkalemia: Secondary | ICD-10-CM | POA: Diagnosis not present

## 2015-10-12 DIAGNOSIS — D509 Iron deficiency anemia, unspecified: Secondary | ICD-10-CM | POA: Diagnosis not present

## 2015-10-12 DIAGNOSIS — D631 Anemia in chronic kidney disease: Secondary | ICD-10-CM | POA: Diagnosis not present

## 2015-10-12 DIAGNOSIS — E1129 Type 2 diabetes mellitus with other diabetic kidney complication: Secondary | ICD-10-CM | POA: Diagnosis not present

## 2015-10-13 DIAGNOSIS — N186 End stage renal disease: Secondary | ICD-10-CM | POA: Diagnosis not present

## 2015-10-13 DIAGNOSIS — I12 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 stage renal disease: Secondary | ICD-10-CM | POA: Diagnosis not present

## 2015-10-13 DIAGNOSIS — Z992 Dependence on renal dialysis: Secondary | ICD-10-CM | POA: Diagnosis not present

## 2015-10-14 DIAGNOSIS — E875 Hyperkalemia: Secondary | ICD-10-CM | POA: Diagnosis not present

## 2015-10-14 DIAGNOSIS — N186 End stage renal disease: Secondary | ICD-10-CM | POA: Diagnosis not present

## 2015-10-14 DIAGNOSIS — E1129 Type 2 diabetes mellitus with other diabetic kidney complication: Secondary | ICD-10-CM | POA: Diagnosis not present

## 2015-10-14 DIAGNOSIS — T8740 Infection of amputation stump, unspecified extremity: Secondary | ICD-10-CM | POA: Diagnosis not present

## 2015-10-14 DIAGNOSIS — N2581 Secondary hyperparathyroidism of renal origin: Secondary | ICD-10-CM | POA: Diagnosis not present

## 2015-10-14 DIAGNOSIS — D631 Anemia in chronic kidney disease: Secondary | ICD-10-CM | POA: Diagnosis not present

## 2015-10-17 DIAGNOSIS — E875 Hyperkalemia: Secondary | ICD-10-CM | POA: Diagnosis not present

## 2015-10-17 DIAGNOSIS — E1129 Type 2 diabetes mellitus with other diabetic kidney complication: Secondary | ICD-10-CM | POA: Diagnosis not present

## 2015-10-17 DIAGNOSIS — T8740 Infection of amputation stump, unspecified extremity: Secondary | ICD-10-CM | POA: Diagnosis not present

## 2015-10-17 DIAGNOSIS — N186 End stage renal disease: Secondary | ICD-10-CM | POA: Diagnosis not present

## 2015-10-17 DIAGNOSIS — N2581 Secondary hyperparathyroidism of renal origin: Secondary | ICD-10-CM | POA: Diagnosis not present

## 2015-10-17 DIAGNOSIS — D631 Anemia in chronic kidney disease: Secondary | ICD-10-CM | POA: Diagnosis not present

## 2015-10-19 ENCOUNTER — Ambulatory Visit: Payer: Medicare Other | Admitting: Neurology

## 2015-10-19 DIAGNOSIS — N2581 Secondary hyperparathyroidism of renal origin: Secondary | ICD-10-CM | POA: Diagnosis not present

## 2015-10-19 DIAGNOSIS — E1129 Type 2 diabetes mellitus with other diabetic kidney complication: Secondary | ICD-10-CM | POA: Diagnosis not present

## 2015-10-19 DIAGNOSIS — D631 Anemia in chronic kidney disease: Secondary | ICD-10-CM | POA: Diagnosis not present

## 2015-10-19 DIAGNOSIS — E875 Hyperkalemia: Secondary | ICD-10-CM | POA: Diagnosis not present

## 2015-10-19 DIAGNOSIS — N186 End stage renal disease: Secondary | ICD-10-CM | POA: Diagnosis not present

## 2015-10-19 DIAGNOSIS — T8740 Infection of amputation stump, unspecified extremity: Secondary | ICD-10-CM | POA: Diagnosis not present

## 2015-10-20 ENCOUNTER — Ambulatory Visit (INDEPENDENT_AMBULATORY_CARE_PROVIDER_SITE_OTHER): Payer: Medicare Other | Admitting: Neurology

## 2015-10-20 ENCOUNTER — Encounter: Payer: Self-pay | Admitting: Neurology

## 2015-10-20 VITALS — BP 104/71 | HR 95 | Ht 73.0 in | Wt 196.0 lb

## 2015-10-20 DIAGNOSIS — G4489 Other headache syndrome: Secondary | ICD-10-CM

## 2015-10-20 DIAGNOSIS — K5732 Diverticulitis of large intestine without perforation or abscess without bleeding: Secondary | ICD-10-CM

## 2015-10-20 DIAGNOSIS — G039 Meningitis, unspecified: Secondary | ICD-10-CM

## 2015-10-20 DIAGNOSIS — G031 Chronic meningitis: Secondary | ICD-10-CM

## 2015-10-20 DIAGNOSIS — I6783 Posterior reversible encephalopathy syndrome: Secondary | ICD-10-CM | POA: Diagnosis not present

## 2015-10-20 DIAGNOSIS — Z992 Dependence on renal dialysis: Secondary | ICD-10-CM

## 2015-10-20 DIAGNOSIS — I63312 Cerebral infarction due to thrombosis of left middle cerebral artery: Secondary | ICD-10-CM | POA: Diagnosis not present

## 2015-10-20 DIAGNOSIS — N186 End stage renal disease: Secondary | ICD-10-CM | POA: Diagnosis not present

## 2015-10-20 DIAGNOSIS — K922 Gastrointestinal hemorrhage, unspecified: Secondary | ICD-10-CM

## 2015-10-20 MED ORDER — BUTALBITAL-APAP-CAFFEINE 50-325-40 MG PO TABS: 1.0000 | ORAL_TABLET | Freq: Two times a day (BID) | ORAL | 0 refills | Status: DC | PRN
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
## 2015-10-20 NOTE — Patient Instructions (Signed)
-   continue ASA and lipitor for stroke prevention - will repeat MRI brain and MRV for monitoring next visit - Follow up with your primary care physician for stroke risk factor modification. Recommend maintain blood pressure goal <130/80, diabetes with hemoglobin A1c goal below 6.5% and lipids with LDL cholesterol goal below 70 mg/dL.  - use fioricet as early as possible for migraine episode. Recommend no more than 2 pills a day and no more than 5 pills a week.  - follow up with nephrology for ESRD on HD - follow up with cardiology and GI regularly - check BP at home - follow up in 6 months to repeat MRI and MRV.

## 2015-10-21 DIAGNOSIS — D631 Anemia in chronic kidney disease: Secondary | ICD-10-CM | POA: Diagnosis not present

## 2015-10-21 DIAGNOSIS — E1129 Type 2 diabetes mellitus with other diabetic kidney complication: Secondary | ICD-10-CM | POA: Diagnosis not present

## 2015-10-21 DIAGNOSIS — N186 End stage renal disease: Secondary | ICD-10-CM | POA: Diagnosis not present

## 2015-10-21 DIAGNOSIS — E875 Hyperkalemia: Secondary | ICD-10-CM | POA: Diagnosis not present

## 2015-10-21 DIAGNOSIS — N2581 Secondary hyperparathyroidism of renal origin: Secondary | ICD-10-CM | POA: Diagnosis not present

## 2015-10-21 DIAGNOSIS — T8740 Infection of amputation stump, unspecified extremity: Secondary | ICD-10-CM | POA: Diagnosis not present

## 2015-10-24 DIAGNOSIS — N186 End stage renal disease: Secondary | ICD-10-CM | POA: Diagnosis not present

## 2015-10-24 DIAGNOSIS — D631 Anemia in chronic kidney disease: Secondary | ICD-10-CM | POA: Diagnosis not present

## 2015-10-24 DIAGNOSIS — E875 Hyperkalemia: Secondary | ICD-10-CM | POA: Diagnosis not present

## 2015-10-24 DIAGNOSIS — N2581 Secondary hyperparathyroidism of renal origin: Secondary | ICD-10-CM | POA: Diagnosis not present

## 2015-10-24 DIAGNOSIS — T8740 Infection of amputation stump, unspecified extremity: Secondary | ICD-10-CM | POA: Diagnosis not present

## 2015-10-24 DIAGNOSIS — E1129 Type 2 diabetes mellitus with other diabetic kidney complication: Secondary | ICD-10-CM | POA: Diagnosis not present

## 2015-10-26 DIAGNOSIS — N186 End stage renal disease: Secondary | ICD-10-CM | POA: Diagnosis not present

## 2015-10-26 DIAGNOSIS — D631 Anemia in chronic kidney disease: Secondary | ICD-10-CM | POA: Diagnosis not present

## 2015-10-26 DIAGNOSIS — N2581 Secondary hyperparathyroidism of renal origin: Secondary | ICD-10-CM | POA: Diagnosis not present

## 2015-10-26 DIAGNOSIS — T8740 Infection of amputation stump, unspecified extremity: Secondary | ICD-10-CM | POA: Diagnosis not present

## 2015-10-26 DIAGNOSIS — E1129 Type 2 diabetes mellitus with other diabetic kidney complication: Secondary | ICD-10-CM | POA: Diagnosis not present

## 2015-10-26 DIAGNOSIS — E875 Hyperkalemia: Secondary | ICD-10-CM | POA: Diagnosis not present

## 2015-10-28 DIAGNOSIS — N2581 Secondary hyperparathyroidism of renal origin: Secondary | ICD-10-CM | POA: Diagnosis not present

## 2015-10-28 DIAGNOSIS — E875 Hyperkalemia: Secondary | ICD-10-CM | POA: Diagnosis not present

## 2015-10-28 DIAGNOSIS — N186 End stage renal disease: Secondary | ICD-10-CM | POA: Diagnosis not present

## 2015-10-28 DIAGNOSIS — T8740 Infection of amputation stump, unspecified extremity: Secondary | ICD-10-CM | POA: Diagnosis not present

## 2015-10-28 DIAGNOSIS — D631 Anemia in chronic kidney disease: Secondary | ICD-10-CM | POA: Diagnosis not present

## 2015-10-28 DIAGNOSIS — E1129 Type 2 diabetes mellitus with other diabetic kidney complication: Secondary | ICD-10-CM | POA: Diagnosis not present

## 2015-10-31 DIAGNOSIS — T8740 Infection of amputation stump, unspecified extremity: Secondary | ICD-10-CM | POA: Diagnosis not present

## 2015-10-31 DIAGNOSIS — N186 End stage renal disease: Secondary | ICD-10-CM | POA: Diagnosis not present

## 2015-10-31 DIAGNOSIS — N2581 Secondary hyperparathyroidism of renal origin: Secondary | ICD-10-CM | POA: Diagnosis not present

## 2015-10-31 DIAGNOSIS — D631 Anemia in chronic kidney disease: Secondary | ICD-10-CM | POA: Diagnosis not present

## 2015-10-31 DIAGNOSIS — E875 Hyperkalemia: Secondary | ICD-10-CM | POA: Diagnosis not present

## 2015-10-31 DIAGNOSIS — E1129 Type 2 diabetes mellitus with other diabetic kidney complication: Secondary | ICD-10-CM | POA: Diagnosis not present

## 2015-11-02 DIAGNOSIS — N2581 Secondary hyperparathyroidism of renal origin: Secondary | ICD-10-CM | POA: Diagnosis not present

## 2015-11-02 DIAGNOSIS — E1129 Type 2 diabetes mellitus with other diabetic kidney complication: Secondary | ICD-10-CM | POA: Diagnosis not present

## 2015-11-02 DIAGNOSIS — E875 Hyperkalemia: Secondary | ICD-10-CM | POA: Diagnosis not present

## 2015-11-02 DIAGNOSIS — D631 Anemia in chronic kidney disease: Secondary | ICD-10-CM | POA: Diagnosis not present

## 2015-11-02 DIAGNOSIS — N186 End stage renal disease: Secondary | ICD-10-CM | POA: Diagnosis not present

## 2015-11-02 DIAGNOSIS — T8740 Infection of amputation stump, unspecified extremity: Secondary | ICD-10-CM | POA: Diagnosis not present

## 2015-11-04 DIAGNOSIS — N186 End stage renal disease: Secondary | ICD-10-CM | POA: Diagnosis not present

## 2015-11-04 DIAGNOSIS — E1129 Type 2 diabetes mellitus with other diabetic kidney complication: Secondary | ICD-10-CM | POA: Diagnosis not present

## 2015-11-04 DIAGNOSIS — T8740 Infection of amputation stump, unspecified extremity: Secondary | ICD-10-CM | POA: Diagnosis not present

## 2015-11-04 DIAGNOSIS — E875 Hyperkalemia: Secondary | ICD-10-CM | POA: Diagnosis not present

## 2015-11-04 DIAGNOSIS — D631 Anemia in chronic kidney disease: Secondary | ICD-10-CM | POA: Diagnosis not present

## 2015-11-04 DIAGNOSIS — N2581 Secondary hyperparathyroidism of renal origin: Secondary | ICD-10-CM | POA: Diagnosis not present

## 2015-11-07 DIAGNOSIS — D631 Anemia in chronic kidney disease: Secondary | ICD-10-CM | POA: Diagnosis not present

## 2015-11-07 DIAGNOSIS — E875 Hyperkalemia: Secondary | ICD-10-CM | POA: Diagnosis not present

## 2015-11-07 DIAGNOSIS — T8740 Infection of amputation stump, unspecified extremity: Secondary | ICD-10-CM | POA: Diagnosis not present

## 2015-11-07 DIAGNOSIS — E1129 Type 2 diabetes mellitus with other diabetic kidney complication: Secondary | ICD-10-CM | POA: Diagnosis not present

## 2015-11-07 DIAGNOSIS — N2581 Secondary hyperparathyroidism of renal origin: Secondary | ICD-10-CM | POA: Diagnosis not present

## 2015-11-07 DIAGNOSIS — N186 End stage renal disease: Secondary | ICD-10-CM | POA: Diagnosis not present

## 2015-11-08 DIAGNOSIS — Z992 Dependence on renal dialysis: Secondary | ICD-10-CM | POA: Diagnosis not present

## 2015-11-08 DIAGNOSIS — N186 End stage renal disease: Secondary | ICD-10-CM | POA: Diagnosis not present

## 2015-11-08 DIAGNOSIS — I871 Compression of vein: Secondary | ICD-10-CM | POA: Diagnosis not present

## 2015-11-08 DIAGNOSIS — T82868D Thrombosis of vascular prosthetic devices, implants and grafts, subsequent encounter: Secondary | ICD-10-CM | POA: Diagnosis not present

## 2015-11-09 DIAGNOSIS — N186 End stage renal disease: Secondary | ICD-10-CM | POA: Diagnosis not present

## 2015-11-09 DIAGNOSIS — E875 Hyperkalemia: Secondary | ICD-10-CM | POA: Diagnosis not present

## 2015-11-09 DIAGNOSIS — E1129 Type 2 diabetes mellitus with other diabetic kidney complication: Secondary | ICD-10-CM | POA: Diagnosis not present

## 2015-11-09 DIAGNOSIS — N2581 Secondary hyperparathyroidism of renal origin: Secondary | ICD-10-CM | POA: Diagnosis not present

## 2015-11-09 DIAGNOSIS — T8740 Infection of amputation stump, unspecified extremity: Secondary | ICD-10-CM | POA: Diagnosis not present

## 2015-11-09 DIAGNOSIS — D631 Anemia in chronic kidney disease: Secondary | ICD-10-CM | POA: Diagnosis not present

## 2015-11-11 ENCOUNTER — Telehealth: Payer: Self-pay | Admitting: Gastroenterology

## 2015-11-11 DIAGNOSIS — E875 Hyperkalemia: Secondary | ICD-10-CM | POA: Diagnosis not present

## 2015-11-11 DIAGNOSIS — T8740 Infection of amputation stump, unspecified extremity: Secondary | ICD-10-CM | POA: Diagnosis not present

## 2015-11-11 DIAGNOSIS — E1129 Type 2 diabetes mellitus with other diabetic kidney complication: Secondary | ICD-10-CM | POA: Diagnosis not present

## 2015-11-11 DIAGNOSIS — N186 End stage renal disease: Secondary | ICD-10-CM | POA: Diagnosis not present

## 2015-11-11 DIAGNOSIS — D631 Anemia in chronic kidney disease: Secondary | ICD-10-CM | POA: Diagnosis not present

## 2015-11-11 DIAGNOSIS — N2581 Secondary hyperparathyroidism of renal origin: Secondary | ICD-10-CM | POA: Diagnosis not present

## 2015-11-11 NOTE — Telephone Encounter (Signed)
He appears to have last been seen by Dr Hilarie Fredrickson when a colon mass was discovered in June this year. He also had sigmoid diverticulitis in July this year.  From his description, it is not clear if this is really diverticulitis, so I do not feel comfortable just prescribing antibiotics without being able to evaluate him.  As noted, we unfortunately do not have availability to do that for about 10 days and it should not wait that long.  I  recommend ED at either [facility] or [facility] hospital.

## 2015-11-11 NOTE — Telephone Encounter (Signed)
Spoke to patient he has been experiencing abdominal pain and diarrhea for over one week. He called and wanted to be seen today. Explained to him that unfortunately there are no available appointments until 10/10. Instructed him to contact his PCP to see if they could see him today, patient states he does not have a PCP any longer. I then suggested he go to the urgent care or could go to Ed. Patient didn't want to do either of those options, and has not been seen here since 09/2014. Encouraged him to go to urgent care as he was very reluctant to go to the hospital.

## 2015-11-12 DIAGNOSIS — I12 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 stage renal disease: Secondary | ICD-10-CM | POA: Diagnosis not present

## 2015-11-12 DIAGNOSIS — Z992 Dependence on renal dialysis: Secondary | ICD-10-CM | POA: Diagnosis not present

## 2015-11-12 DIAGNOSIS — N186 End stage renal disease: Secondary | ICD-10-CM | POA: Diagnosis not present

## 2015-11-14 DIAGNOSIS — N2581 Secondary hyperparathyroidism of renal origin: Secondary | ICD-10-CM | POA: Diagnosis not present

## 2015-11-14 DIAGNOSIS — E875 Hyperkalemia: Secondary | ICD-10-CM | POA: Diagnosis not present

## 2015-11-14 DIAGNOSIS — Z23 Encounter for immunization: Secondary | ICD-10-CM | POA: Diagnosis not present

## 2015-11-14 DIAGNOSIS — N186 End stage renal disease: Secondary | ICD-10-CM | POA: Diagnosis not present

## 2015-11-16 DIAGNOSIS — N2581 Secondary hyperparathyroidism of renal origin: Secondary | ICD-10-CM | POA: Diagnosis not present

## 2015-11-16 DIAGNOSIS — Z23 Encounter for immunization: Secondary | ICD-10-CM | POA: Diagnosis not present

## 2015-11-16 DIAGNOSIS — N186 End stage renal disease: Secondary | ICD-10-CM | POA: Diagnosis not present

## 2015-11-16 DIAGNOSIS — E875 Hyperkalemia: Secondary | ICD-10-CM | POA: Diagnosis not present

## 2015-11-18 DIAGNOSIS — N2581 Secondary hyperparathyroidism of renal origin: Secondary | ICD-10-CM | POA: Diagnosis not present

## 2015-11-18 DIAGNOSIS — E875 Hyperkalemia: Secondary | ICD-10-CM | POA: Diagnosis not present

## 2015-11-18 DIAGNOSIS — N186 End stage renal disease: Secondary | ICD-10-CM | POA: Diagnosis not present

## 2015-11-18 DIAGNOSIS — Z23 Encounter for immunization: Secondary | ICD-10-CM | POA: Diagnosis not present

## 2015-11-21 DIAGNOSIS — N186 End stage renal disease: Secondary | ICD-10-CM | POA: Diagnosis not present

## 2015-11-21 DIAGNOSIS — N2581 Secondary hyperparathyroidism of renal origin: Secondary | ICD-10-CM | POA: Diagnosis not present

## 2015-11-21 DIAGNOSIS — E875 Hyperkalemia: Secondary | ICD-10-CM | POA: Diagnosis not present

## 2015-11-21 DIAGNOSIS — Z23 Encounter for immunization: Secondary | ICD-10-CM | POA: Diagnosis not present

## 2015-11-22 ENCOUNTER — Ambulatory Visit: Payer: Medicare Other | Admitting: Physician Assistant

## 2015-11-23 DIAGNOSIS — E875 Hyperkalemia: Secondary | ICD-10-CM | POA: Diagnosis not present

## 2015-11-23 DIAGNOSIS — Z23 Encounter for immunization: Secondary | ICD-10-CM | POA: Diagnosis not present

## 2015-11-23 DIAGNOSIS — N2581 Secondary hyperparathyroidism of renal origin: Secondary | ICD-10-CM | POA: Diagnosis not present

## 2015-11-23 DIAGNOSIS — N186 End stage renal disease: Secondary | ICD-10-CM | POA: Diagnosis not present

## 2015-11-25 DIAGNOSIS — Z23 Encounter for immunization: Secondary | ICD-10-CM | POA: Diagnosis not present

## 2015-11-25 DIAGNOSIS — E875 Hyperkalemia: Secondary | ICD-10-CM | POA: Diagnosis not present

## 2015-11-25 DIAGNOSIS — N2581 Secondary hyperparathyroidism of renal origin: Secondary | ICD-10-CM | POA: Diagnosis not present

## 2015-11-25 DIAGNOSIS — N186 End stage renal disease: Secondary | ICD-10-CM | POA: Diagnosis not present

## 2015-11-28 DIAGNOSIS — E875 Hyperkalemia: Secondary | ICD-10-CM | POA: Diagnosis not present

## 2015-11-28 DIAGNOSIS — Z23 Encounter for immunization: Secondary | ICD-10-CM | POA: Diagnosis not present

## 2015-11-28 DIAGNOSIS — N186 End stage renal disease: Secondary | ICD-10-CM | POA: Diagnosis not present

## 2015-11-28 DIAGNOSIS — N2581 Secondary hyperparathyroidism of renal origin: Secondary | ICD-10-CM | POA: Diagnosis not present

## 2015-12-02 ENCOUNTER — Observation Stay (HOSPITAL_COMMUNITY)
Admission: EM | Admit: 2015-12-02 | Discharge: 2015-12-03 | Disposition: A | Discharge status: Home / Self Care | Payer: Medicare Other | Attending: Internal Medicine | Admitting: Internal Medicine

## 2015-12-02 ENCOUNTER — Encounter (HOSPITAL_COMMUNITY): Payer: Self-pay | Admitting: *Deleted

## 2015-12-02 ENCOUNTER — Observation Stay (HOSPITAL_COMMUNITY): Payer: Medicare Other

## 2015-12-02 ENCOUNTER — Observation Stay (HOSPITAL_BASED_OUTPATIENT_CLINIC_OR_DEPARTMENT_OTHER)
Admit: 2015-12-02 | Discharge: 2015-12-02 | Disposition: A | Discharge status: Home / Self Care | Payer: Medicare Other | Attending: Internal Medicine | Admitting: Internal Medicine

## 2015-12-02 ENCOUNTER — Emergency Department (HOSPITAL_COMMUNITY): Payer: Medicare Other

## 2015-12-02 DIAGNOSIS — G459 Transient cerebral ischemic attack, unspecified: Secondary | ICD-10-CM | POA: Diagnosis present

## 2015-12-02 DIAGNOSIS — M898X9 Other specified disorders of bone, unspecified site: Secondary | ICD-10-CM | POA: Diagnosis not present

## 2015-12-02 DIAGNOSIS — R519 Headache, unspecified: Secondary | ICD-10-CM | POA: Diagnosis present

## 2015-12-02 DIAGNOSIS — I10 Essential (primary) hypertension: Secondary | ICD-10-CM | POA: Diagnosis present

## 2015-12-02 DIAGNOSIS — Z7982 Long term (current) use of aspirin: Secondary | ICD-10-CM | POA: Diagnosis not present

## 2015-12-02 DIAGNOSIS — Z992 Dependence on renal dialysis: Secondary | ICD-10-CM | POA: Diagnosis not present

## 2015-12-02 DIAGNOSIS — G458 Other transient cerebral ischemic attacks and related syndromes: Secondary | ICD-10-CM

## 2015-12-02 DIAGNOSIS — D631 Anemia in chronic kidney disease: Secondary | ICD-10-CM | POA: Diagnosis not present

## 2015-12-02 DIAGNOSIS — N186 End stage renal disease: Secondary | ICD-10-CM

## 2015-12-02 DIAGNOSIS — Z87891 Personal history of nicotine dependence: Secondary | ICD-10-CM | POA: Diagnosis not present

## 2015-12-02 DIAGNOSIS — R51 Headache: Secondary | ICD-10-CM | POA: Diagnosis not present

## 2015-12-02 DIAGNOSIS — Z79899 Other long term (current) drug therapy: Secondary | ICD-10-CM | POA: Insufficient documentation

## 2015-12-02 DIAGNOSIS — R569 Unspecified convulsions: Secondary | ICD-10-CM | POA: Diagnosis not present

## 2015-12-02 DIAGNOSIS — R200 Anesthesia of skin: Secondary | ICD-10-CM | POA: Diagnosis not present

## 2015-12-02 DIAGNOSIS — Z8673 Personal history of transient ischemic attack (TIA), and cerebral infarction without residual deficits: Secondary | ICD-10-CM | POA: Diagnosis not present

## 2015-12-02 DIAGNOSIS — R531 Weakness: Secondary | ICD-10-CM | POA: Diagnosis present

## 2015-12-02 DIAGNOSIS — G43909 Migraine, unspecified, not intractable, without status migrainosus: Secondary | ICD-10-CM | POA: Diagnosis not present

## 2015-12-02 DIAGNOSIS — Z531 Procedure and treatment not carried out because of patient's decision for reasons of belief and group pressure: Secondary | ICD-10-CM

## 2015-12-02 DIAGNOSIS — IMO0001 Reserved for inherently not codable concepts without codable children: Secondary | ICD-10-CM

## 2015-12-02 DIAGNOSIS — I12 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 stage renal disease: Secondary | ICD-10-CM | POA: Diagnosis not present

## 2015-12-02 DIAGNOSIS — E785 Hyperlipidemia, unspecified: Secondary | ICD-10-CM | POA: Diagnosis not present

## 2015-12-02 DIAGNOSIS — R29818 Other symptoms and signs involving the nervous system: Secondary | ICD-10-CM | POA: Diagnosis not present

## 2015-12-02 LAB — I-STAT CHEM 8, ED
BUN: 98 mg/dL — ABNORMAL HIGH (ref 6–20)
Calcium, Ion: 1.01 mmol/L — ABNORMAL LOW (ref 1.15–1.40)
Chloride: 100 mmol/L — ABNORMAL LOW (ref 101–111)
Creatinine, Ser: 14.6 mg/dL — ABNORMAL HIGH (ref 0.61–1.24)
Glucose, Bld: 78 mg/dL (ref 65–99)
HCT: 31 % — ABNORMAL LOW (ref 39.0–52.0)
Hemoglobin: 10.5 g/dL — ABNORMAL LOW (ref 13.0–17.0)
Potassium: 5.9 mmol/L — ABNORMAL HIGH (ref 3.5–5.1)
Sodium: 136 mmol/L (ref 135–145)
TCO2: 25 mmol/L (ref 0–100)

## 2015-12-02 LAB — CBC
HCT: 31.3 % — ABNORMAL LOW (ref 39.0–52.0)
Hemoglobin: 10 g/dL — ABNORMAL LOW (ref 13.0–17.0)
MCH: 26.1 pg (ref 26.0–34.0)
MCHC: 31.9 g/dL (ref 30.0–36.0)
MCV: 81.7 fL (ref 78.0–100.0)
Platelets: 266 10*3/uL (ref 150–400)
RBC: 3.83 MIL/uL — ABNORMAL LOW (ref 4.22–5.81)
RDW: 18 % — ABNORMAL HIGH (ref 11.5–15.5)
WBC: 6.5 10*3/uL (ref 4.0–10.5)

## 2015-12-02 LAB — COMPREHENSIVE METABOLIC PANEL
ALT: 10 U/L — ABNORMAL LOW (ref 17–63)
AST: 14 U/L — ABNORMAL LOW (ref 15–41)
Albumin: 2.9 g/dL — ABNORMAL LOW (ref 3.5–5.0)
Alkaline Phosphatase: 65 U/L (ref 38–126)
Anion gap: 17 — ABNORMAL HIGH (ref 5–15)
BUN: 101 mg/dL — ABNORMAL HIGH (ref 6–20)
CO2: 23 mmol/L (ref 22–32)
Calcium: 8.8 mg/dL — ABNORMAL LOW (ref 8.9–10.3)
Chloride: 96 mmol/L — ABNORMAL LOW (ref 101–111)
Creatinine, Ser: 13.63 mg/dL — ABNORMAL HIGH (ref 0.61–1.24)
GFR calc Af Amer: 4 mL/min — ABNORMAL LOW (ref >60)
GFR calc non Af Amer: 3 mL/min — ABNORMAL LOW (ref >60)
Glucose, Bld: 81 mg/dL (ref 65–99)
Potassium: 6.1 mmol/L — ABNORMAL HIGH (ref 3.5–5.1)
Sodium: 136 mmol/L (ref 135–145)
Total Bilirubin: 0.3 mg/dL (ref 0.3–1.2)
Total Protein: 7.6 g/dL (ref 6.5–8.1)

## 2015-12-02 LAB — DIFFERENTIAL
Basophils Absolute: 0.1 10*3/uL (ref 0.0–0.1)
Basophils Relative: 1 %
Eosinophils Absolute: 0.4 10*3/uL (ref 0.0–0.7)
Eosinophils Relative: 6 %
Lymphocytes Relative: 22 %
Lymphs Abs: 1.4 10*3/uL (ref 0.7–4.0)
Monocytes Absolute: 0.3 10*3/uL (ref 0.1–1.0)
Monocytes Relative: 5 %
Neutro Abs: 4.3 10*3/uL (ref 1.7–7.7)
Neutrophils Relative %: 66 %

## 2015-12-02 LAB — ETHANOL: Alcohol, Ethyl (B): <5 mg/dL (ref <5)

## 2015-12-02 LAB — I-STAT TROPONIN, ED: Troponin i, poc: 0 ng/mL (ref 0.00–0.08)

## 2015-12-02 LAB — PROTIME-INR
INR: 1.09
Prothrombin Time: 14.1 seconds (ref 11.4–15.2)

## 2015-12-02 LAB — APTT: aPTT: 33 seconds (ref 24–36)

## 2015-12-02 MED ORDER — PENTAFLUOROPROP-TETRAFLUOROETH EX AERO: 1.0000 "application " | INHALATION_SPRAY | CUTANEOUS | Status: DC | PRN

## 2015-12-02 MED ORDER — STROKE: EARLY STAGES OF RECOVERY BOOK
Freq: Once | Status: AC
  Administered 2015-12-02: 11:00:00
  Filled 2015-12-02: qty 1

## 2015-12-02 MED ORDER — CALCIUM ACETATE (PHOS BINDER) 667 MG PO CAPS
2001.0000 mg | ORAL_CAPSULE | Freq: Three times a day (TID) | ORAL | Status: DC
  Administered 2015-12-02 – 2015-12-03 (×2): 2001 mg via ORAL
  Administered 2015-12-03 (×2): 667 mg via ORAL
  Filled 2015-12-02 (×5): qty 3

## 2015-12-02 MED ORDER — RENA-VITE PO TABS
1.0000 | ORAL_TABLET | Freq: Every day | ORAL | Status: DC
  Administered 2015-12-02: 1 via ORAL
  Filled 2015-12-02: qty 1

## 2015-12-02 MED ORDER — OXYCODONE-ACETAMINOPHEN 5-325 MG PO TABS: 2.0000 | ORAL_TABLET | Freq: Once | ORAL | Status: DC

## 2015-12-02 MED ORDER — ASPIRIN 81 MG PO CHEW
324.0000 mg | CHEWABLE_TABLET | Freq: Once | ORAL | Status: AC
  Administered 2015-12-02: 324 mg via ORAL
  Filled 2015-12-02: qty 4

## 2015-12-02 MED ORDER — CINACALCET HCL 30 MG PO TABS
90.0000 mg | ORAL_TABLET | Freq: Two times a day (BID) | ORAL | Status: DC
  Administered 2015-12-02 – 2015-12-03 (×2): 90 mg via ORAL
  Filled 2015-12-02 (×3): qty 3

## 2015-12-02 MED ORDER — ASPIRIN 81 MG PO CHEW
81.0000 mg | CHEWABLE_TABLET | Freq: Every day | ORAL | Status: DC
  Administered 2015-12-03: 81 mg via ORAL
  Filled 2015-12-02: qty 1

## 2015-12-02 MED ORDER — LIDOCAINE HCL (PF) 1 % IJ SOLN: 5.0000 mL | INTRAMUSCULAR | Status: DC | PRN

## 2015-12-02 MED ORDER — BUTALBITAL-APAP-CAFFEINE 50-325-40 MG PO TABS
1.0000 | ORAL_TABLET | Freq: Two times a day (BID) | ORAL | Status: DC | PRN
  Administered 2015-12-02 – 2015-12-03 (×2): 1 via ORAL
  Filled 2015-12-02 (×3): qty 1

## 2015-12-02 MED ORDER — LANTHANUM CARBONATE 500 MG PO CHEW
1000.0000 mg | CHEWABLE_TABLET | Freq: Three times a day (TID) | ORAL | Status: DC
  Administered 2015-12-02 – 2015-12-03 (×3): 1000 mg via ORAL
  Filled 2015-12-02 (×4): qty 2

## 2015-12-02 MED ORDER — DIALYVITE 3000 3 MG PO TABS: 1.0000 | ORAL_TABLET | Freq: Every day | ORAL | Status: DC

## 2015-12-02 MED ORDER — DOCUSATE SODIUM 100 MG PO CAPS: 100.0000 mg | ORAL_CAPSULE | Freq: Two times a day (BID) | ORAL | Status: DC | PRN

## 2015-12-02 MED ORDER — OXYCODONE-ACETAMINOPHEN 5-325 MG PO TABS
ORAL_TABLET | ORAL | Status: AC
  Filled 2015-12-02: qty 2

## 2015-12-02 MED ORDER — SODIUM CHLORIDE 0.9 % IV SOLN: 100.0000 mL | INTRAVENOUS | Status: DC | PRN

## 2015-12-02 MED ORDER — ATORVASTATIN CALCIUM 10 MG PO TABS
10.0000 mg | ORAL_TABLET | Freq: Every day | ORAL | Status: DC
  Administered 2015-12-02: 10 mg via ORAL
  Filled 2015-12-02: qty 1

## 2015-12-02 MED ORDER — HEPARIN SODIUM (PORCINE) 5000 UNIT/ML IJ SOLN
5000.0000 [IU] | Freq: Three times a day (TID) | INTRAMUSCULAR | Status: DC
Start: 2015-12-02 — End: 2015-12-03
  Administered 2015-12-02 – 2015-12-03 (×2): 5000 [IU] via SUBCUTANEOUS
  Filled 2015-12-02 (×2): qty 1

## 2015-12-02 MED ORDER — LIDOCAINE-PRILOCAINE 2.5-2.5 % EX CREA: 1.0000 "application " | TOPICAL_CREAM | CUTANEOUS | Status: DC | PRN

## 2015-12-02 MED ORDER — OXYCODONE-ACETAMINOPHEN 5-325 MG PO TABS
1.0000 | ORAL_TABLET | Freq: Once | ORAL | Status: AC
Start: 2015-12-02 — End: 2015-12-02
  Administered 2015-12-02: 1 via ORAL

## 2015-12-02 NOTE — ED Triage Notes (Signed)
Pt was getting ready for dialysis this morning, was walking out to the car and had sudden onset of R arm numbness. LSW 0550. R arm drift noted, no other neuro deficits. Pt ambulatory with steady gait

## 2015-12-02 NOTE — Progress Notes (Signed)
Pt at hemodialysis. Will continue attempts.  Blair Heys, [place], [place]

## 2015-12-02 NOTE — ED Notes (Signed)
MD at bedside. 

## 2015-12-02 NOTE — Progress Notes (Addendum)
Patient arrived from ED to 5M11. Safety precautions and orders reviewed. VSS. Attending MD paged. No other distress noted. MRI left for MRI as soon as pt arrived to room then EEG wants pt next. Will continue to monitor.  Jalaiya Oyster, RN.

## 2015-12-02 NOTE — ED Notes (Signed)
NIH at this time 0. Next neuro check 10am. Passed swallow screen. MRA ordered. Pt ambulatory and alert and ox4.

## 2015-12-02 NOTE — ED Provider Notes (Signed)
[facility] DEPT Provider Note   CSN: [id number] Arrival date & time: 12/02/15  3536   An emergency department physician performed an initial assessment on this suspected stroke patient at 0628.  History   Chief Complaint Chief Complaint  Patient presents with  . Code Stroke    HPI [name] Sr. is a 55 y.o. male.  The history is provided by the patient.  Weakness  Primary symptoms include focal weakness. This is a new problem. The current episode started 1 to 2 hours ago. The problem has been rapidly improving. There has been no fever. Pertinent negatives include no shortness of breath, no chest pain and no vomiting. Associated medical issues comments: h/o ESRD.  patient with h/o ESRD on dialysis (due today) presents with right arm weakness He reports at approximately 0515am he has onset of right arm weakness/numbness and some "twitching" No LOC He is now improving Denies h/o seizure   Past Medical History:  Diagnosis Date  . Anemia   . Chronic headaches   . Chronic kidney disease    Chronic Renal Failure; On Renal Transplant List  . Diverticulitis   . GI bleed   . Hemodialysis patient ([place])    M-W-F  . Hypertension   . Presence of arteriovenous fistula for hemodialysis, primary ([place])    RUE  . Refusal of blood product    NO WHOLE BLOOD PROUCTS  . Sleep apnea   . Stroke [facility])     Patient Active Problem List   Diagnosis Date Noted  . Acute pericarditis   . Atypical chest pain 10/01/2015  . Abnormal EKG 10/01/2015  . Chest pain 10/01/2015  . 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 GI bleed 08/01/2015  . Acute blood loss anemia   . Acute diverticulitis   . Lung nodule   . Diverticulitis of cecum 07/21/2015  . Diverticulitis of large intestine without perforation or abscess without bleeding   . Right lower quadrant pain   . Headache 05/05/2015  . 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 Hypertension secondary to other renal  disorders 08/26/2014  . Diverticulosis 07/13/2014  . Secondary hyperparathyroidism ([place]) 07/13/2014  . Lower GI bleed 07/12/2014  . Hematochezia   . Acute posthemorrhagic anemia   . End stage renal disease ([place])   . Refusal of blood transfusions as patient is Jehovah's Witness   . Parotid mass   . HLD (hyperlipidemia)   . PRES (posterior reversible encephalopathy syndrome)   . Stroke ([place]) 05/23/2014  . CVA (cerebral infarction) 05/23/2014  . Anemia of renal disease 05/23/2014  . HTN (hypertension) 12/09/2012  . GERD (gastroesophageal reflux disease) 12/09/2012  . ESRD on dialysis ([place]) 12/09/2012  . Gastrointestinal bleeding 11/12/2012    Past Surgical History:  Procedure Laterality Date  . AV FISTULA PLACEMENT Right    right arm  . COLONOSCOPY N/A 08/04/2015   Procedure: COLONOSCOPY;  Surgeon: Jerene Bears, MD;  Location: [facility] ENDOSCOPY;  Service: Endoscopy;  Laterality: N/A;  . graft left arm Left    for dialysis x 2  . INSERTION OF DIALYSIS CATHETER     Rt chest  . LAPAROSCOPIC RIGHT COLECTOMY N/A 08/05/2015   Procedure: LAPAROSCOPIC RIGHT COLECTOMY- ASCENDING;  Surgeon: Stark Klein, MD;  Location: [place];  Service: General;  Laterality: N/A;       Home Medications    Prior to Admission medications   Medication Sig Start Date End Date Taking? Authorizing Provider  aspirin EC 81 MG tablet Take 1  tablet (81 mg total) by mouth daily. 08/26/14   Rosalin Hawking, MD  atorvastatin (LIPITOR) 10 MG tablet Take 1 tablet (10 mg total) by mouth daily at 6 PM. 05/25/14   Thurnell Lose, MD  butalbital-acetaminophen-caffeine (FIORICET, ESGIC) 50-325-40 MG tablet Take 1 tablet by mouth every 12 (twelve) hours as needed for headache. No more than 2 tabs a day and no more than 5 tabs a week. 10/20/15   Rosalin Hawking, MD  calcium acetate (PHOSLO) 667 MG capsule Take 2,001 mg by mouth 3 (three) times daily with meals.     Historical Provider, MD  cinacalcet (SENSIPAR) 90 MG tablet Take 1 tablet (90  mg total) by mouth 2 (two) times daily. 08/10/15   Lavina Hamman, MD  colchicine 0.6 MG tablet Take 0.5 tablets (0.3 mg total) by mouth daily. 10/03/15   [name], MD  docusate sodium (COLACE) 100 MG capsule Take 1 capsule (100 mg total) by mouth 2 (two) times daily as needed for mild constipation. 08/10/15   Lavina Hamman, MD  folic acid-vitamin b complex-vitamin c-selenium-zinc (DIALYVITE) 3 MG TABS tablet Take 1 tablet by mouth daily.    Historical Provider, MD  gabapentin (NEURONTIN) 100 MG capsule Take 100 mg by mouth 2 (two) times daily.  09/29/14   Historical Provider, MD  ibuprofen (ADVIL,MOTRIN) 800 MG tablet  10/14/15   Historical Provider, MD  lanthanum (FOSRENOL) 1000 MG chewable tablet Chew 1,000 mg by mouth 3 (three) times daily with meals.    Historical Provider, MD  ondansetron (ZOFRAN) 4 MG tablet Take 1 tablet (4 mg total) by mouth every 6 (six) hours. 09/10/15   Vira Blanco, MD  sorbitol 70 % solution Take 15-60 mLs by mouth daily as needed (dialysis days).     Historical Provider, MD    Family History Family History  Problem Relation Age of Onset  . Diabetes Father   . Stroke Father   . Hypertension Father   . Uterine cancer Mother   . Lupus Sister   . Stroke Sister   . Anuerysm Brother     brain  . Hypertension Sister     Social History Social History  Substance Use Topics  . Smoking status: Former Smoker    Quit date: 06/19/1990  . Smokeless tobacco: Never Used  . Alcohol use 0.6 oz/week    1 Cans of beer per week     Comment: occasional     Allergies   Infed [iron dextran]; Oxycodone; Vicodin [hydrocodone-acetaminophen]; Diclofenac; and Whole blood   Review of Systems Review of Systems  Constitutional: Negative for fever.  Respiratory: Negative for shortness of breath.   Cardiovascular: Negative for chest pain.  Gastrointestinal: Negative for vomiting.  Neurological: Positive for focal weakness, weakness and numbness.  All other systems reviewed  and are negative.    Physical Exam Updated Vital Signs BP 120/83   Pulse 82   Temp 97.8 F (36.6 C) (Oral)   Resp 18   Ht 6\' 2"  (1.88 m)   Wt 88.9 kg   SpO2 100%   BMI 25.16 kg/m   Physical Exam CONSTITUTIONAL: Well developed/well nourished HEAD: Normocephalic/atraumatic EYES: EOMI/PERRL ENMT: Mucous membranes moist NECK: supple no meningeal signs CV: S1/S2 noted, no murmurs/rubs/gallops noted LUNGS: Lungs are clear to auscultation bilaterally, no apparent distress ABDOMEN: soft, nontender GU:no cva tenderness NEURO: Pt is awake/alert/appropriate, moves all extremitiesx4.  No facial droop.  No arm or leg drift EXTREMITIES: pulses normal/equal, full ROM, dialysis access to right  UE, thrill noted SKIN: warm, color normal PSYCH: no abnormalities of mood noted, alert and oriented to situation   ED Treatments / Results  Labs (all labs ordered are listed, but only abnormal results are displayed) Labs Reviewed  CBC - Abnormal; Notable for the following:       Result Value   RBC 3.83 (*)    Hemoglobin 10.0 (*)    HCT 31.3 (*)    RDW 18.0 (*)    All other components within normal limits  I-STAT CHEM 8, ED - Abnormal; Notable for the following:    Potassium 5.9 (*)    Chloride 100 (*)    BUN 98 (*)    Creatinine, Ser 14.60 (*)    Calcium, Ion 1.01 (*)    Hemoglobin 10.5 (*)    HCT 31.0 (*)    All other components within normal limits  PROTIME-INR  APTT  DIFFERENTIAL  ETHANOL  COMPREHENSIVE METABOLIC PANEL  RAPID URINE DRUG SCREEN, HOSP PERFORMED  URINALYSIS, ROUTINE W REFLEX MICROSCOPIC (NOT AT [facility])  Randolm Idol, ED    EKG  EKG Interpretation  Date/Time:  Friday December 02 2015 06:57:57 EDT Ventricular Rate:  83 PR Interval:    QRS Duration: 98 QT Interval:  381 QTC Calculation: 448 R Axis:   35 Text Interpretation:  Sinus rhythm Low voltage, precordial leads Confirmed by Christy Gentles  MD, Morayo Leven ([id number]) on 12/02/2015 7:01:24 AM        Radiology Ct Head Code Stroke W/o Cm  Result Date: 12/02/2015 CLINICAL DATA:  Code stroke. Initial evaluation for acute right arm weakness. EXAM: CT HEAD WITHOUT CONTRAST TECHNIQUE: Contiguous axial images were obtained from the base of the skull through the vertex without intravenous contrast. COMPARISON:  Prior CT from 02/05/2015. FINDINGS: Brain: Cerebral volume is stable. Remote lacunar infarct within the right caudate head, stable. No acute intracranial hemorrhage. No evidence for acute large vessel territory infarct. Gray-white matter differentiation maintained. Deep gray nuclei maintained. No mass lesion, midline shift or mass effect. No hydrocephalus. Calvarium intact. No extra-axial fluid collection. Prominent dural calcifications. Vascular: Skull: Scalp soft tissues within normal limits. Sinuses/Orbits: Globes and orbital soft tissues within normal limits. Paranasal sinuses are clear. No mastoid effusion. ASPECTS [facility] Stroke Program Early CT Score) - Ganglionic level infarction (caudate, lentiform nuclei, internal capsule, insula, M1-M3 cortex): 7 - Supraganglionic infarction (M4-M6 cortex): 3 Total score (0-10 with 10 being normal): 10 IMPRESSION: 1. No acute intracranial process identified. 2. ASPECTS is 10 3. Remote left basal ganglia lacunar infarct. 4. Prominent dural calcifications, stable. Critical Value/emergent results were called by telephone at the time of interpretation on 12/02/2015 at 6:53 am to Dr. Nicole Kindred , who verbally acknowledged these results. Electronically Signed   By: Jeannine Boga M.D.   On: 12/02/2015 06:55    Procedures Procedures (including critical care time)  Medications Ordered in ED Medications - No data to display   Initial Impression / Assessment and Plan / ED Course  I have reviewed the triage vital signs and the nursing notes.  Pertinent labs  results that were available during my care of the patient were reviewed by me and considered in  my medical decision making (see chart for details).  Clinical Course    Pt seen for weakness, now back to baseline Seen by neurology dr Nicole Kindred He recommends TIA workup and admission Pt agreeable D/w internal medicine for admission He will need dialysis today   tPA in stroke considered but not given due to: Symptoms resolved  Final Clinical Impressions(s) / ED Diagnoses   Final diagnoses:  Transient cerebral ischemia, unspecified type    New Prescriptions New Prescriptions   No medications on file     Ripley Fraise, MD 12/02/15 [phone]

## 2015-12-02 NOTE — Progress Notes (Signed)
Code stroke called on 55 y.o male . Pertinent history includes HTN, ESRD on HD, Stroke, HLD, migraines.  Pt developed acute numbness, weakness and tremor in right arm while walking to car. Pt was on the way to HD outpatient this morning. Pt taken to CT scan ST, reviewed per Neurologist Dr. Nicole Kindred.  NIHSS competed yielding 0, Pt with no neuro deficits in exam. He does have a slight tremor in both arms/hands. Pt for admit for neurological evaluation. No TPA at this time due to mild and resolved symptoms.

## 2015-12-02 NOTE — Consult Note (Signed)
Admission H&P    Chief Complaint: Acute onset of numbness and uncontrolled movements of right upper extremity.  HPI: Carl Hoel Sr. is an 55 y.o. male history of hypertension, end-stage renal disease on dialysis, GI bleed, diverticulitis and anemia, presenting with acute onset of right upper extremity numbness with shaking of his right upper extremity and to lesser extent twitching of the right side of his face at 5:50 AM this morning. He complained of weakness of his right upper extremity arriving in the emergency room. Speech was unchanged. He had no weakness no numbness involving right lower extremity. There is an equivocal history of previous stroke. He's been taking aspirin daily. He is on dialysis and was on his way to dialysis when symptoms occurred this morning. CT scan of his head showed no acute intracranial abnormality. Deficits resolved and NIH stroke score at time of this evaluation was 0.  LSN: 5:50 AM on 12/02/2015 tPA Given: No: Deficits resolved mRankin:  Past Medical History:  Diagnosis Date  . Anemia   . Chronic headaches   . Chronic kidney disease    Chronic Renal Failure; On Renal Transplant List  . Diverticulitis   . GI bleed   . Hemodialysis patient ([place])    M-W-F  . Hypertension   . Presence of arteriovenous fistula for hemodialysis, primary ([place])    RUE  . Refusal of blood product    NO WHOLE BLOOD PROUCTS  . Sleep apnea   . Stroke [facility])     Past Surgical History:  Procedure Laterality Date  . AV FISTULA PLACEMENT Right    right arm  . COLONOSCOPY N/A 08/04/2015   Procedure: COLONOSCOPY;  Surgeon: Jerene Bears, MD;  [facility] ENDOSCOPY;  Service: Endoscopy;  Laterality: N/A;  . graft left arm Left    for dialysis x 2  . INSERTION OF DIALYSIS CATHETER     Rt chest  . LAPAROSCOPIC RIGHT COLECTOMY N/A 08/05/2015   Procedure: LAPAROSCOPIC RIGHT COLECTOMY- ASCENDING;  Surgeon: Stark Klein, MD;  Location: [facility] OR;  Service: General;  Laterality: N/A;     Family History  Problem Relation Age of Onset  . Diabetes Father   . Stroke Father   . Hypertension Father   . Uterine cancer Mother   . Lupus Sister   . Stroke Sister   . Anuerysm Brother     brain  . Hypertension Sister    Social History:  reports that he quit smoking about 25 years ago. He has never used smokeless tobacco. He reports that he drinks about 0.6 oz of alcohol per week . He reports that he does not use drugs.  Allergies:  Allergies  Allergen Reactions  . Infed [Iron Dextran] Other (See Comments)    Decreased BP   . Oxycodone Nausea Only  . Vicodin [Hydrocodone-Acetaminophen] Other (See Comments)    Decreased BP  . Diclofenac Other (See Comments)    Unknown  . Whole Blood Other (See Comments)    Unk    Medications: Preadmission medications were reviewed by me.  ROS: History obtained from the patient  General ROS: negative for - chills, fatigue, fever, night sweats, weight gain or weight loss Psychological ROS: negative for - behavioral disorder, hallucinations, memory difficulties, mood swings or suicidal ideation Ophthalmic ROS: negative for - blurry vision, double vision, eye pain or loss of vision ENT ROS: negative for - epistaxis, nasal discharge, oral lesions, sore throat, tinnitus or vertigo Allergy and Immunology ROS: negative for - hives or itchy/watery  eyes Hematological and Lymphatic ROS: negative for - bleeding problems, bruising or swollen lymph nodes Endocrine ROS: negative for - galactorrhea, hair pattern changes, polydipsia/polyuria or temperature intolerance Respiratory ROS: negative for - cough, hemoptysis, shortness of breath or wheezing Cardiovascular ROS: negative for - chest pain, dyspnea on exertion, edema or irregular heartbeat Gastrointestinal ROS: negative for - abdominal pain, diarrhea, hematemesis, nausea/vomiting or stool incontinence Genito-Urinary ROS: negative for - dysuria, hematuria, incontinence or urinary  frequency/urgency Musculoskeletal ROS: negative for - joint swelling or muscular weakness Neurological ROS: as noted in HPI Dermatological ROS: negative for rash and skin lesion changes  Physical Examination: Blood pressure 120/83, pulse 82, temperature 97.8 F (36.6 C), temperature source Oral, resp. rate 18, height 6\' 2"  (1.88 m), weight 88.9 kg (196 lb), SpO2 100 %.  HEENT-  Normocephalic, no lesions, without obvious abnormality.  Normal external eye and conjunctiva.  Normal TM's bilaterally.  Normal auditory canals and external ears. Normal external nose, mucus membranes and septum.  Normal pharynx. Neck supple with no masses, nodes, nodules or enlargement. Cardiovascular - regular rate and rhythm, S1, S2 normal, no murmur, click, rub or gallop Lungs - chest clear, no wheezing, rales, normal symmetric air entry Abdomen - soft, non-tender; bowel sounds normal; no masses,  no organomegaly Extremities - no joint deformities, effusion, or inflammation and no edema  Neurologic Examination: Mental Status: Alert, oriented, thought content appropriate.  Speech fluent without evidence of aphasia. Able to follow commands without difficulty. Cranial Nerves: II-Visual fields were normal. III/IV/VI-Pupils were equal and reacted normally to light. Extraocular movements were full and conjugate.    V/VII-no facial numbness and no facial weakness. VIII-normal. X-normal speech and symmetrical palatal movement. XI: trapezius strength/neck flexion strength normal bilaterally XII-midline tongue extension with normal strength. Motor: 5/5 bilaterally with normal tone and bulk Sensory: Normal throughout. Deep Tendon Reflexes: Absent bilaterally. Plantars: Flexor bilaterally Cerebellar: Normal finger-to-nose testing. Carotid auscultation: Normal  Results for orders placed or performed during the hospital encounter of 12/02/15 (from the past 48 hour(s))  CBC     Status: Abnormal   Collection Time:  12/02/15  6:26 AM  Result Value Ref Range   WBC 6.5 4.0 - 10.5 K/uL   RBC 3.83 (L) 4.22 - 5.81 MIL/uL   Hemoglobin 10.0 (L) 13.0 - 17.0 g/dL   HCT 31.3 (L) 39.0 - 52.0 %   MCV 81.7 78.0 - 100.0 fL   MCH 26.1 26.0 - 34.0 pg   MCHC 31.9 30.0 - 36.0 g/dL   RDW 18.0 (H) 11.5 - 15.5 %   Platelets 266 150 - 400 K/uL  Differential     Status: None   Collection Time: 12/02/15  6:26 AM  Result Value Ref Range   Neutrophils Relative % 66 %   Neutro Abs 4.3 1.7 - 7.7 K/uL   Lymphocytes Relative 22 %   Lymphs Abs 1.4 0.7 - 4.0 K/uL   Monocytes Relative 5 %   Monocytes Absolute 0.3 0.1 - 1.0 K/uL   Eosinophils Relative 6 %   Eosinophils Absolute 0.4 0.0 - 0.7 K/uL   Basophils Relative 1 %   Basophils Absolute 0.1 0.0 - 0.1 K/uL  I-stat troponin, ED (not at [facility])     Status: None   Collection Time: 12/02/15  6:53 AM  Result Value Ref Range   Troponin i, poc 0.00 0.00 - 0.08 ng/mL   Comment 3            Comment: Due to the release kinetics  of cTnI, a negative result within the first hours of the onset of symptoms does not rule out myocardial infarction with certainty. If myocardial infarction is still suspected, repeat the test at appropriate intervals.   I-Stat Chem 8, ED  (not at [facility])     Status: Abnormal   Collection Time: 12/02/15  6:55 AM  Result Value Ref Range   Sodium 136 135 - 145 mmol/L   Potassium 5.9 (H) 3.5 - 5.1 mmol/L   Chloride 100 (L) 101 - 111 mmol/L   BUN 98 (H) 6 - 20 mg/dL   Creatinine, Ser 14.60 (H) 0.61 - 1.24 mg/dL   Glucose, Bld 78 65 - 99 mg/dL   Calcium, Ion 1.01 (L) 1.15 - 1.40 mmol/L   TCO2 25 0 - 100 mmol/L   Hemoglobin 10.5 (L) 13.0 - 17.0 g/dL   HCT 31.0 (L) 39.0 - 52.0 %   Ct Head Code Stroke W/o Cm  Result Date: 12/02/2015 CLINICAL DATA:  Code stroke. Initial evaluation for acute right arm weakness. EXAM: CT HEAD WITHOUT CONTRAST TECHNIQUE: Contiguous axial images were obtained from the base of the skull through the vertex without  intravenous contrast. COMPARISON:  Prior CT from 02/05/2015. FINDINGS: Brain: Cerebral volume is stable. Remote lacunar infarct within the right caudate head, stable. No acute intracranial hemorrhage. No evidence for acute large vessel territory infarct. Gray-white matter differentiation maintained. Deep gray nuclei maintained. No mass lesion, midline shift or mass effect. No hydrocephalus. Calvarium intact. No extra-axial fluid collection. Prominent dural calcifications. Vascular: Skull: Scalp soft tissues within normal limits. Sinuses/Orbits: Globes and orbital soft tissues within normal limits. Paranasal sinuses are clear. No mastoid effusion. ASPECTS [facility] Stroke Program Early CT Score) - Ganglionic level infarction (caudate, lentiform nuclei, internal capsule, insula, M1-M3 cortex): 7 - Supraganglionic infarction (M4-M6 cortex): 3 Total score (0-10 with 10 being normal): 10 IMPRESSION: 1. No acute intracranial process identified. 2. ASPECTS is 10 3. Remote left basal ganglia lacunar infarct. 4. Prominent dural calcifications, stable. Critical Value/emergent results were called by telephone at the time of interpretation on 12/02/2015 at 6:53 am to Dr. Nicole Kindred , who verbally acknowledged these results. Electronically Signed   By: Jeannine Boga M.D.   On: 12/02/2015 06:55    Assessment: 55 y.o. male presenting with possible transient ischemic attack with numbness and weakness involving right upper extremity. Abnormal movements with twitching of right upper extremity and right-sided face are suggestive of possible focal seizure activity, however.  Stroke Risk Factors - hypertension  Plan: 1. HgbA1c, fasting lipid panel 2. MRI, MRA  of the brain without contrast 3. PT consult, OT consult, Speech consult 4. Echocardiogram 5. Carotid dopplers 6. Prophylactic therapy-Antiplatelet med: Aspirin  7. Risk factor modification 8. Telemetry monitoring 9. EEG, routine about study  C.R. Nicole Kindred,  MD [facility] [phone]  12/02/2015, 7:13 AM

## 2015-12-02 NOTE — Evaluation (Signed)
Physical Therapy Evaluation Patient Details Name: [name]. MRN: [id number] DOB: 16-Jun-1960 Today's Date: 12/02/2015   History of Present Illness  pt presents with R UE weakness and decreased coordination.  pt with hx of CVA, HTN, ESRD, CKD, and Anemia.    Clinical Impression  Pt moving well and per pt is at baseline.  Only deficits noted are continued decreased coordination with R UE.  No further PT needs at this time, Will sign off.      Follow Up Recommendations No PT follow up;Supervision - Intermittent    Equipment Recommendations  None recommended by PT    Recommendations for Other Services       Precautions / Restrictions Precautions Precautions: None Restrictions Weight Bearing Restrictions: No      Mobility  Bed Mobility Overal bed mobility: Independent                Transfers Overall transfer level: Independent Equipment used: None                Ambulation/Gait Ambulation/Gait assistance: Independent Ambulation Distance (Feet): 200 Feet Assistive device: None Gait Pattern/deviations: WFL(Within Functional Limits)     General Gait Details: pt moving well and indicates this is baseline for him.  pt toes out Bil LEs, but this is also baseline.    Stairs            Wheelchair Mobility    Modified Rankin (Stroke Patients Only) Modified Rankin (Stroke Patients Only) Pre-Morbid Rankin Score: No symptoms Modified Rankin: No symptoms     Balance Overall balance assessment: Independent                                           Pertinent Vitals/Pain Pain Assessment: No/denies pain    Home Living Family/patient expects to be discharged to:: Private residence Living Arrangements: Alone Available Help at Discharge: Family;Friend(s);Available PRN/intermittently Type of Home: House Home Access: Stairs to enter Entrance Stairs-Rails: Left Entrance Stairs-Number of Steps: "couple" Home Layout: One  level Home Equipment: None      Prior Function Level of Independence: Independent         Comments: pt drives himself to/from HD and no deficits after HD.       Hand Dominance        Extremity/Trunk Assessment   Upper Extremity Assessment: Defer to OT evaluation           Lower Extremity Assessment: Overall WFL for tasks assessed      Cervical / Trunk Assessment: Normal  Communication   Communication: No difficulties  Cognition Arousal/Alertness: Awake/alert Behavior During Therapy: WFL for tasks assessed/performed Overall Cognitive Status: Within Functional Limits for tasks assessed                      General Comments      Exercises     Assessment/Plan    PT Assessment Patent does not need any further PT services  PT Problem List            PT Treatment Interventions      PT Goals (Current goals can be found in the Care Plan section)  Acute Rehab PT Goals Patient Stated Goal: Home PT Goal Formulation: All assessment and education complete, DC therapy    Frequency     Barriers to discharge        Co-evaluation  End of Session   Activity Tolerance: Patient tolerated treatment well Patient left: in bed;with call bell/phone within reach (sitting EOB) Nurse Communication: Mobility status    Functional Assessment Tool Used: Clinical Judgement Functional Limitation: Mobility: Walking and moving around Mobility: Walking and Moving Around Current Status (L2440): 0 percent impaired, limited or restricted Mobility: Walking and Moving Around Goal Status (N0272): 0 percent impaired, limited or restricted Mobility: Walking and Moving Around Discharge Status [phone]): 0 percent impaired, limited or restricted    Time: 1135-1151 PT Time Calculation (min) (ACUTE ONLY): 16 min   Charges:   PT Evaluation $PT Eval Low Complexity: 1 Procedure     PT G Codes:   PT G-Codes **NOT FOR INPATIENT CLASS** Functional Assessment  Tool Used: Clinical Judgement Functional Limitation: Mobility: Walking and moving around Mobility: Walking and Moving Around Current Status (Q0347): 0 percent impaired, limited or restricted Mobility: Walking and Moving Around Goal Status (Q2595): 0 percent impaired, limited or restricted Mobility: Walking and Moving Around Discharge Status (G3875): 0 percent impaired, limited or restricted    Catarina Hartshorn, Luck 12/02/2015, 11:57 AM

## 2015-12-02 NOTE — H&P (Signed)
Date: 12/02/2015               Patient Name:  Carl Daubenspeck Sr. MRN: [id number]  DOB: 10/30/1960 Age / Sex: 55 y.o., male   PCP: Jamal Maes, MD         Medical Service: Internal Medicine Teaching Service         Attending Physician: Dr. Sid Falcon, MD    First Contact: Dr. Philipp Ovens Pager: [phone]  Second Contact: Dr. Juleen China Pager: [phone]       After Hours (After 5p/  First Contact Pager: [phone]  weekends / holidays): Second Contact Pager: [phone]   Chief Complaint: right arm weakness  History of Present Illness: 55 year old male with past medical history of chronic headaches, end-stage renal disease on hemodialysis, diverticulitis, hypertension, prior CVA, and sleep apnea who presents with right arm weakness. Patient was in his usual state of health this morning when woke up. He walked to his car and when he was starting his car noticed that right arm was weak and he noticed jerking of his right arm. He also developed some twitching of the right side of his face but denies any difficulty speaking or slurred. He was able to walk out of his car into his son's attention. His son drove him to the emergency department. His son denies any facial droop or slurred speech and the patient. On arrival to the emergency department patient states his right hand weakness has improved but is not back at baseline. He takes a daily aspirin but has not taken any this morning. He denies fevers, night sweats, chills, recent illness, diarrhea, constipation, chest pain, blurred vision, bloody stools, and abdominal pain. He has a right-sided headache that began after his symptoms started.  In the ED CT of his head was negative for any acute intracranial abnormality. His right-sided arm weakness improved and he was not given tPA. He follows with neurology and last saw him in clinic on October 20, 2015. Dr. Erlinda Hong was concerned for the possibility of chronic meningitis versus hypertrophic  pachymeningitis versus partial CVT noted on MRI. He recommended repeat MRI in 6 months at that time. He was last admitted to [facility] and discharged on 10/03/2015 for chest pain. He had an echo done at that time EF of 60-65% with mild LVH.   Meds:  Asa colchine Gabapentin advil zofran Sorbitol lipitor fioricet phoslo sensipar Colace dalyvate fosrenol    Allergies: Allergies as of 12/02/2015 - Review Complete 12/02/2015  Allergen Reaction Noted  . Infed [iron dextran] Other (See Comments) 06/19/2010  . Oxycodone Nausea Only 06/19/2010  . Vicodin [hydrocodone-acetaminophen] Other (See Comments) 06/19/2010  . Diclofenac Other (See Comments) 07/01/2014  . Whole blood Other (See Comments) 07/01/2014   Past Medical History:  Diagnosis Date  . Anemia   . Chronic headaches   . Chronic kidney disease    Chronic Renal Failure; On Renal Transplant List  . Diverticulitis   . GI bleed   . Hemodialysis patient ([place])    M-W-F  . Hypertension   . Presence of arteriovenous fistula for hemodialysis, primary ([place])    RUE  . Refusal of blood product    NO WHOLE BLOOD PROUCTS  . Sleep apnea   . Stroke [facility])     Family History  Problem Relation Age of Onset  . Diabetes Father   . Stroke Father   . Hypertension Father   . Uterine cancer Mother   . Lupus Sister   .  Stroke Sister   . Anuerysm Brother     brain  . Hypertension Sister     Social History   Social History  . Marital status: Single    Spouse name: N/A  . Number of children: 2  . Years of education: N/A   Occupational History  . retired Sports coach Employed   Social History Main Topics  . Smoking status: Former Smoker    Quit date: 06/19/1990  . Smokeless tobacco: Never Used  . Alcohol use 0.6 oz/week    1 Cans of beer per week     Comment: occasional  . Drug use: No  . Sexual activity: Not on file   Other Topics Concern  . Not on file   Social History Narrative   Lives alone at home   12  years of education    2 children    Not married        Review of Systems: A complete ROS was negative except as per HPI.   Physical Exam: Blood pressure 125/86, pulse 80, temperature 97.8 F (36.6 C), temperature source Oral, resp. rate 12, height 6\' 2"  (1.88 m), weight 196 lb (88.9 kg), SpO2 100 %. Physical Exam  Constitutional: He is oriented to person, place, and time. He appears well-developed and well-nourished. No distress.  HENT:  Head: Normocephalic and atraumatic.  Nose: Nose normal.  Mouth/Throat: Oropharynx is clear and moist. No oropharyngeal exudate.  Eyes: Conjunctivae and EOM are normal. Pupils are equal, round, and reactive to light. Right eye exhibits no discharge. Left eye exhibits no discharge. No scleral icterus.  Neck: Normal range of motion. Neck supple.  Cardiovascular: Normal rate, regular rhythm and normal heart sounds.  Exam reveals no gallop and no friction rub.   No murmur heard. Pulmonary/Chest: Effort normal and breath sounds normal. No stridor. No respiratory distress. He has no wheezes. He has no rales. He exhibits no tenderness.  Abdominal: Soft. Bowel sounds are normal. He exhibits no distension and no mass. There is no tenderness. There is no rebound and no guarding.  Musculoskeletal: He exhibits no edema.  Neurological: He is alert and oriented to person, place, and time. No cranial nerve deficit. Coordination normal.  Sensation intact, 5/5 motor strength in all 4 extremities, nl finger to nose and rapid pincer grasp b/l  Skin: Skin is warm and dry. No rash noted. He is not diaphoretic. No erythema. No pallor.  Psychiatric: He has a normal mood and affect. His behavior is normal. Judgment and thought content normal.    EKG: nsr, peaked T waves unchanged from 10/20  CT head-- no acute intracranial abnormality, remote left basal ganglia lacunar infarct  Assessment & Plan by Problem: Principal Problem:   TIA (transient ischemic attack) Active  Problems:   ESRD on dialysis ([place])   HLD (hyperlipidemia)   Refusal of blood transfusions as patient is Jehovah's Witness   Headache   Essential hypertension  TIA-- patient presenting with right arm weakness and jerking this morning when attempting to drive to hemodialysis. He also noted some right facial twitching without any slurred speech. His right arm weakness has improved but is not back to baseline. On physical exam he has 5 out of 5 bilateral upper extremity strength with intact sensation. CT head was negative. He does have a right-sided headache with this started after his weakness. Unlikely this is a complicated migraine. Possibly TIA versus seizure. Hemoglobin A1c on August 20 was 4.8, and LDL was 72 -Admit to telemetry -  Neurology following, appreciate recommendations - EEG - MRI/MRA of brain, Allow permissive hypertension pending these results - Carotid Dopplers - Continue statin  - Aspirin 324 mg once a day, then 81 mg daily starting tomorrow - PT/OT consult   End-stage renal disease on hemodialysis- nephrology consultation for dialysis today  Chronic headaches --continue home Fioricet   DVT prophylaxis-heparin   code-full Diet- renal    Dispo: Admit patient to Observation with expected length of stay less than 2 midnights.  Signed: Norman Herrlich, MD 12/02/2015, 7:46 AM  Pager: [phone]

## 2015-12-02 NOTE — Progress Notes (Signed)
Patient is back from EEG and MRI. TELE applied and confirmed. Dialysis will come and pick pt up in 45 minutes. Awaiting for pharmacy to verified med. Pt request for headache med.   Ave Filter, RN

## 2015-12-02 NOTE — Consult Note (Signed)
[facility] Renal Consultation Note    Indication for Consultation:  Management of ESRD/hemodialysis, anemia, hypertension/volume, and secondary hyperparathyroidism. PCP:  HPI: Carl Brookover Sr. is a 55 y.o. male with ESRD, HTN, sleep apnea, and Hx CVA presented to ED after developing stroke-like symptoms including R arm  and face twitching/weakness and R sided headache. R hand symptoms resolved by the time he was evaluated in the ED, still has R sided headache. Currently denies CP, dyspnea, N/V/D, abdominal pain, fever or chills.  Head CT and brain MRI/MRA negative for acute stroke. Labs ok except K 5.9, Alb 2.9. EEG pending. He is due for dialysis today (MWF schedule at [facility]).  Past Medical History:  Diagnosis Date  . Anemia   . Chronic headaches   . Chronic kidney disease    Chronic Renal Failure; On Renal Transplant List  . Diverticulitis   . GI bleed   . Hemodialysis patient ([place])    M-W-F  . Hypertension   . Presence of arteriovenous fistula for hemodialysis, primary ([place])    RUE  . Refusal of blood product    NO WHOLE BLOOD PROUCTS  . Sleep apnea   . Stroke [facility])    Past Surgical History:  Procedure Laterality Date  . AV FISTULA PLACEMENT Right    right arm  . COLONOSCOPY N/A 08/04/2015   Procedure: COLONOSCOPY;  Surgeon: Jerene Bears, MD;  Location: [facility] ENDOSCOPY;  Service: Endoscopy;  Laterality: N/A;  . graft left arm Left    for dialysis x 2  . INSERTION OF DIALYSIS CATHETER     Rt chest  . LAPAROSCOPIC RIGHT COLECTOMY N/A 08/05/2015   Procedure: LAPAROSCOPIC RIGHT COLECTOMY- ASCENDING;  Surgeon: Stark Klein, MD;  Location: [facility] OR;  Service: General;  Laterality: N/A;   Family History  Problem Relation Age of Onset  . Diabetes Father   . Stroke Father   . Hypertension Father   . Uterine cancer Mother   . Lupus Sister   . Stroke Sister   . Anuerysm Brother     brain  . Hypertension Sister    Social History:  reports that  he quit smoking about 25 years ago. He has never used smokeless tobacco. He reports that he drinks about 0.6 oz of alcohol per week . He reports that he does not use drugs.  ROS: As per HPI otherwise negative.  Physical Exam: Vitals:   12/02/15 0715 12/02/15 0745 12/02/15 0810 12/02/15 1134  BP: 125/86 130/81 133/80 124/85  Pulse: 80 79 73 79  Resp: 12 16 18 18   Temp:   98 F (36.7 C) 97.9 F (36.6 C)  TempSrc:   Oral Oral  SpO2: 100% 100% 100% 100%  Weight:      Height:         General: Well developed, well nourished, in no acute distress. Head: Normocephalic, atraumatic, sclera non-icteric, mucus membranes are moist. Neck: Supple without lymphadenopathy/masses. JVD not elevated. Lungs: Clear bilaterally to auscultation without wheezes, rales, or rhonchi. Breathing is unlabored. Heart: RRR with normal S1, S2. No murmurs, rubs, or gallops appreciated. Abdomen: Soft, non-tender, non-distended with normoactive bowel sounds. No rebound/guarding.  Musculoskeletal:  Strength and tone appear normal for age. Lower extremities: No edema or ischemic changes, no open wounds. Neuro: Alert and oriented X 3. Moves all extremities spontaneously. Psych:  Responds to questions appropriately with a normal affect. Dialysis Access: R forearm AVF + thrill/bruit  Allergies  Allergen Reactions  . Ardyth Harps Antonieta Pert Dextran] Other (  See Comments)    Decreased BP   . Oxycodone Nausea Only  . Vicodin [Hydrocodone-Acetaminophen] Other (See Comments)    Decreased BP  . Diclofenac Other (See Comments)    Unknown  . Whole Blood Other (See Comments)    Unk   Prior to Admission medications   Medication Sig Start Date End Date Taking? Authorizing Provider  aspirin EC 81 MG tablet Take 1 tablet (81 mg total) by mouth daily. 08/26/14  Yes Rosalin Hawking, MD  atorvastatin (LIPITOR) 10 MG tablet Take 1 tablet (10 mg total) by mouth daily at 6 PM. 05/25/14  Yes Thurnell Lose, MD  butalbital-acetaminophen-caffeine  (FIORICET, ESGIC) 50-325-40 MG tablet Take 1 tablet by mouth every 12 (twelve) hours as needed for headache. No more than 2 tabs a day and no more than 5 tabs a week. 10/20/15  Yes Rosalin Hawking, MD  calcium acetate (PHOSLO) 667 MG capsule Take 2,001 mg by mouth 3 (three) times daily with meals.    Yes Historical Provider, MD  cinacalcet (SENSIPAR) 90 MG tablet Take 1 tablet (90 mg total) by mouth 2 (two) times daily. 08/10/15  Yes Lavina Hamman, MD  docusate sodium (COLACE) 100 MG capsule Take 1 capsule (100 mg total) by mouth 2 (two) times daily as needed for mild constipation. 08/10/15  Yes Lavina Hamman, MD  folic acid-vitamin b complex-vitamin c-selenium-zinc (DIALYVITE) 3 MG TABS tablet Take 1 tablet by mouth daily.   Yes Historical Provider, MD  gabapentin (NEURONTIN) 100 MG capsule Take 100 mg by mouth 2 (two) times daily.  09/29/14  Yes Historical Provider, MD  ibuprofen (ADVIL,MOTRIN) 800 MG tablet Take 800 mg by mouth every 6 (six) hours as needed for mild pain.  10/14/15  Yes Historical Provider, MD  lanthanum (FOSRENOL) 1000 MG chewable tablet Chew 1,000 mg by mouth 3 (three) times daily with meals.   Yes Historical Provider, MD  ondansetron (ZOFRAN) 4 MG tablet Take 1 tablet (4 mg total) by mouth every 6 (six) hours. 09/10/15  Yes Vira Blanco, MD  sorbitol 70 % solution Take 15-60 mLs by mouth daily as needed (dialysis days).    Yes Historical Provider, MD  colchicine 0.6 MG tablet Take 0.5 tablets (0.3 mg total) by mouth daily. Patient not taking: Reported on 12/02/2015 10/03/15   [name], MD   Current Facility-Administered Medications  Medication Dose Route Frequency Provider Last Rate Last Dose  . aspirin chewable tablet 324 mg  324 mg Oral Once Norman Herrlich, MD      . Derrill Memo ON 12/03/2015] aspirin chewable tablet 81 mg  81 mg Oral Daily Norman Herrlich, MD      . atorvastatin (LIPITOR) tablet 10 mg  10 mg Oral q1800 Norman Herrlich, MD      . butalbital-acetaminophen-caffeine  [facility], ESGIC) [phone] MG per tablet 1 tablet  1 tablet Oral Q12H PRN Norman Herrlich, MD      . calcium acetate (PHOSLO) capsule 2,001 mg  2,001 mg Oral TID WC Norman Herrlich, MD      . cinacalcet [facility]) tablet 90 mg  90 mg Oral BID Norman Herrlich, MD      . docusate sodium (COLACE) capsule 100 mg  100 mg Oral BID PRN Norman Herrlich, MD      . folic acid-vitamin b complex-vitamin c-selenium-zinc (DIALYVITE) tablet 1 tablet  1 tablet Oral Daily Norman Herrlich, MD      . heparin injection 5,000 Units  5,000  Units Subcutaneous Q8H Norman Herrlich, MD      . [facility] Bullocks [facility]) chewable tablet 1,000 mg  1,000 mg Oral TID WC Norman Herrlich, MD       Labs: Basic Metabolic Panel:  Recent Labs Lab 12/02/15 0626 12/02/15 0655  NA 136 136  K 6.1* 5.9*  CL 96* 100*  CO2 23  --   GLUCOSE 81 78  BUN 101* 98*  CREATININE 13.63* 14.60*  CALCIUM 8.8*  --    Liver Function Tests:  Recent Labs Lab 12/02/15 0626  AST 14*  ALT 10*  ALKPHOS 65  BILITOT 0.3  PROT 7.6  ALBUMIN 2.9*   CBC:  Recent Labs Lab 12/02/15 0626 12/02/15 0655  WBC 6.5  --   NEUTROABS 4.3  --   HGB 10.0* 10.5*  HCT 31.3* 31.0*  MCV 81.7  --   PLT 266  --    Studies/Results: Carl Brain Wo Contrast  Result Date: 12/02/2015 CLINICAL DATA:  55 year old male with acute numbness and uncontrolled movements of the right upper extremity. Initial encounter. Chronic renal failure. EXAM: MRI HEAD WITHOUT CONTRAST MRA HEAD WITHOUT CONTRAST TECHNIQUE: Multiplanar, multiecho pulse sequences of the brain and surrounding structures were obtained without intravenous contrast. Angiographic images of the head were obtained using MRA technique without contrast. COMPARISON:  Head CT without contrast 0639 hours today. Brain MRI and intracranial MRV 05/17/2015 and 05/12/2015. Head CT without contrast 02/05/2015. Brain MRI and intracranial MRA 05/23/2014, and earlier FINDINGS: MRI HEAD FINDINGS Brain: No restricted diffusion to  suggest acute infarction. No midline shift, mass effect, evidence of mass lesion, ventriculomegaly, extra-axial collection or acute intracranial hemorrhage. Cervicomedullary junction and pituitary are within normal limits. Chronic severe abnormal dural thickening and calcification as demonstrated by CT. Stable gray and white matter signal since March 2017. 2016 abnormal T2 and FLAIR hyperintensity in the right parietal lobe appears resolved without encephalomalacia. Chronic lacunar infarct anterior left corona radiata and caudate again noted. No cortical encephalomalacia or chronic cerebral blood products identified. Vascular: Major intracranial vascular flow voids are stable, although there is chronically an abnormal appearance of the superior sagittal sinus in conjunction with chronic but occur dura thickening and calcification with as the presence but since at [place]. The intracranial arterial flow voids appear stable and normal; azygos type proximal ACA anatomy. Skull and upper cervical spine: Negative visualized cervical spine. Stable bone marrow signal. Sinuses/Orbits: Postoperative changes to the globes since March. Otherwise negative orbit soft tissues. Visualized paranasal sinuses and mastoids are stable and well pneumatized ; chronic mild left petrous apex fluid. Other: Other Visible internal auditory structures appear normal. Chronic Tornwaldt cyst. MRA HEAD FINDINGS Antegrade flow in the posterior circulation with dominant distal left vertebral artery. No distal vertebral stenosis. Both PICA origins appear patent. Vertebrobasilar junction is patent. No basilar stenosis. Left AICA, bilateral SCA and bilateral PCA origins remain normal. Posterior communicating arteries are diminutive or absent. Bilateral PCA branches remain normal. Stable antegrade flow in both ICA siphons. Irregularity along the cavernous right ICA (series 6, image 101) appears stable since 2016 and could be related to atherosclerotic  pseudo lesion or a small unusual vessel origin infundibulum. No right ICA siphon stenosis. Mild irregularity and stenosis at the left ICA anterior genu is stable. Ophthalmic artery origins remain normal. Carotid termini, MCA and ACA origins appear stable and normal. Dominant left ACA A1 segment. Azygos type proximal ACA anatomy (normal variant). Visible ACA branches remain normal. MCA M1 segments, MCA bifurcations, and visualized bilateral  MCA branches remain normal. IMPRESSION: 1.  No acute intracranial abnormality. 2. Mild chronic small vessel disease appears stable since March. 2016 right parietal signal abnormality which was felt related to PRES has resolved without encephalomalacia. 3. 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 This appears stable since the MRV on 05/17/2015. 4. Stable since 2016 and largely unremarkable intracranial MRA. ICA siphon atherosclerosis without stenosis. Small right cavernous ICA segment pseudo-lesion versus infundibulum. Electronically Signed   By: Genevie Ann M.D.   On: 12/02/2015 11:09   [name] Head/brain QM Cm  Result Date: 12/02/2015 CLINICAL DATA:  55 year old male with acute numbness and uncontrolled movements of the right upper extremity. Initial encounter. Chronic renal failure. EXAM: MRI HEAD WITHOUT CONTRAST MRA HEAD WITHOUT CONTRAST TECHNIQUE: Multiplanar, multiecho pulse sequences of the brain and surrounding structures were obtained without intravenous contrast. Angiographic images of the head were obtained using MRA technique without contrast. COMPARISON:  Head CT without contrast 0639 hours today. Brain MRI and intracranial MRV 05/17/2015 and 05/12/2015. Head CT without contrast 02/05/2015. Brain MRI and intracranial MRA 05/23/2014, and earlier FINDINGS: MRI HEAD FINDINGS Brain: No restricted diffusion to suggest acute infarction. No midline shift, mass effect, evidence of mass lesion, ventriculomegaly,  extra-axial collection or acute intracranial hemorrhage. Cervicomedullary junction and pituitary are within normal limits. Chronic severe abnormal dural thickening and calcification as demonstrated by CT. Stable gray and white matter signal since March 2017. 2016 abnormal T2 and FLAIR hyperintensity in the right parietal lobe appears resolved without encephalomalacia. Chronic lacunar infarct anterior left corona radiata and caudate again noted. No cortical encephalomalacia or chronic cerebral blood products identified. Vascular: Major intracranial vascular flow voids are stable, although there is chronically an abnormal appearance of the superior sagittal sinus in conjunction with chronic but occur dura thickening and calcification with as the presence but since at [place]. The intracranial arterial flow voids appear stable and normal; azygos type proximal ACA anatomy. Skull and upper cervical spine: Negative visualized cervical spine. Stable bone marrow signal. Sinuses/Orbits: Postoperative changes to the globes since March. Otherwise negative orbit soft tissues. Visualized paranasal sinuses and mastoids are stable and well pneumatized ; chronic mild left petrous apex fluid. Other: Other Visible internal auditory structures appear normal. Chronic Tornwaldt cyst. MRA HEAD FINDINGS Antegrade flow in the posterior circulation with dominant distal left vertebral artery. No distal vertebral stenosis. Both PICA origins appear patent. Vertebrobasilar junction is patent. No basilar stenosis. Left AICA, bilateral SCA and bilateral PCA origins remain normal. Posterior communicating arteries are diminutive or absent. Bilateral PCA branches remain normal. Stable antegrade flow in both ICA siphons. Irregularity along the cavernous right ICA (series 6, image 101) appears stable since 2016 and could be related to atherosclerotic pseudo lesion or a small unusual vessel origin infundibulum. No right ICA siphon stenosis. Mild  irregularity and stenosis at the left ICA anterior genu is stable. Ophthalmic artery origins remain normal. Carotid termini, MCA and ACA origins appear stable and normal. Dominant left ACA A1 segment. Azygos type proximal ACA anatomy (normal variant). Visible ACA branches remain normal. MCA M1 segments, MCA bifurcations, and visualized bilateral MCA branches remain normal. IMPRESSION: 1.  No acute intracranial abnormality. 2. Mild chronic small vessel disease appears stable since March. 2016 right parietal signal abnormality which was felt related to PRES has resolved without encephalomalacia. 3. 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 This appears stable since the MRV on 05/17/2015. 4.  Stable since 2016 and largely unremarkable intracranial MRA. ICA siphon atherosclerosis without stenosis. Small right cavernous ICA segment pseudo-lesion versus infundibulum. Electronically Signed   By: Genevie Ann M.D.   On: 12/02/2015 11:09   Ct Head Code Stroke W/o Cm  Result Date: 12/02/2015 CLINICAL DATA:  Code stroke. Initial evaluation for acute right arm weakness. EXAM: CT HEAD WITHOUT CONTRAST TECHNIQUE: Contiguous axial images were obtained from the base of the skull through the vertex without intravenous contrast. COMPARISON:  Prior CT from 02/05/2015. FINDINGS: Brain: Cerebral volume is stable. Remote lacunar infarct within the right caudate head, stable. No acute intracranial hemorrhage. No evidence for acute large vessel territory infarct. Gray-white matter differentiation maintained. Deep gray nuclei maintained. No mass lesion, midline shift or mass effect. No hydrocephalus. Calvarium intact. No extra-axial fluid collection. Prominent dural calcifications. Vascular: Skull: Scalp soft tissues within normal limits. Sinuses/Orbits: Globes and orbital soft tissues within normal limits. Paranasal sinuses are clear. No mastoid effusion. ASPECTS [facility] Stroke  Program Early CT Score) - Ganglionic level infarction (caudate, lentiform nuclei, internal capsule, insula, M1-M3 cortex): 7 - Supraganglionic infarction (M4-M6 cortex): 3 Total score (0-10 with 10 being normal): 10 IMPRESSION: 1. No acute intracranial process identified. 2. ASPECTS is 10 3. Remote left basal ganglia lacunar infarct. 4. Prominent dural calcifications, stable. Critical Value/emergent results were called by telephone at the time of interpretation on 12/02/2015 at 6:53 am to Dr. Nicole Kindred , who verbally acknowledged these results. Electronically Signed   By: Jeannine Boga M.D.   On: 12/02/2015 06:55    Dialysis Orders:  MWF at [facility] 4:30 mins, 2K/2Ca bath, EDW 85kg, AVF, BFR 500/DFR A1.5 - No heparin - Mircera 169mcg IV q 2 weeks, last given 11/02/15. - Hectoral 80mcg IV q HD  Assessment/Plan: 1.  Stroke-like symptoms: Symptoms resolved now except headache. CT and MRI negative for acute stroke, EEG pending. Plan per primary team and Neurology. 2.  ESRD: Continue HD per MWF schedule, will dialyze today. K 5.9. 3.  Hypertension/volume: BP controlled now, has been getting low side often with outpt HD. Spoke to neurologist, no need to avoid hypotension specifically as he did not have a stroke and this episode was not precipitated by hypotension episode. That being said, may need to raise EDW on discharge. 4.  Anemia: Hgb 10.5. No ESA for now. 5.  Metabolic bone disease: Ca 8.8; continue binders/VDRA/sensipar. 6.  Nutrition:  Alb 2.9. Continue high protein diet. Adding Nepro.  Veneta Penton, PA-C 12/02/2015, 12:44 PM  [facility] Pager: [phone]  Pt seen, examined, agree w assess/plan as above with additions as indicated. ESRD pt with focal tremors/ twitching of the R face and arm and R sided headache while warming up his car to go to dialysis this morning.  Symptoms resolved, no signs of CVA by imaging.  Has residual R headache. Keep BP up on HD today , raise  dry wt at dc.  Will follow.   Kelly Splinter MD [employer] pager [phone]    cell [phone] 12/02/2015, 4:33 PM

## 2015-12-02 NOTE — Progress Notes (Signed)
OT Cancellation Note  Patient Details Name: Carl Saavedra Sr. MRN: [id number] DOB: [birth year]   Cancelled Treatment:    Reason Eval/Treat Not Completed: Patient at procedure or test/ unavailable (HD). Will follow.  Malka So 12/02/2015, 1:21 PM  [phone]

## 2015-12-02 NOTE — ED Notes (Signed)
Family at bedside. 

## 2015-12-02 NOTE — Procedures (Signed)
ELECTROENCEPHALOGRAM REPORT  Date of Study: 12/02/2015  Patient's Name: Carl Casanova Sr. MRN: [id number] Date of Birth: September 18, 1960  Referring Provider: Dr. Julious Oka  Clinical History: This is a 55 year old man with an episode of right arm weakness and jerking, right facial twitching.  Medications: aspirin chewable tablet 81 mg  butalbital-acetaminophen-caffeine (FIORICET, ESGIC) 50-325-40 MG per tablet 1 tablet  calcium acetate (PHOSLO) capsule 2,001 mg  cinacalcet (SENSIPAR) tablet 90 mg  docusate sodium (COLACE) capsule 830 mg  folic acid-vitamin b complex-vitamin c-selenium-zinc (DIALYVITE) tablet 1 tablet  heparin injection 5,000 Units  lanthanum (FOSRENOL) chewable tablet 1,000 mg   Technical Summary: A multichannel digital EEG recording measured by the international 10-20 system with electrodes applied with paste and impedances below 5000 ohms performed in our laboratory with EKG monitoring in an awake and asleep patient.  Hyperventilation was not performed. Photic stimulation was performed.  The digital EEG was referentially recorded, reformatted, and digitally filtered in a variety of bipolar and referential montages for optimal display.    Description: The patient is awake and asleep during the recording.  During maximal wakefulness, there is a symmetric, medium voltage 9-9.5 Hz posterior dominant rhythm that attenuates with eye opening.  The record is symmetric.  During drowsiness and sleep, there is an increase in theta slowing of the background.  Vertex waves and symmetric sleep spindles were seen.  Photic stimulation did not elicit any abnormalities. There were occasional sharp transients seen over the left > right central regions, that appear artifactual in nature. There were no clear epileptiform discharges or electrographic seizures seen.    EKG lead was unremarkable.  Impression: This awake and asleep EEG is within normal limits.  Clinical Correlation: A  normal EEG does not exclude a clinical diagnosis of epilepsy. Clinical correlation is advised.   Ellouise Newer, M.D.

## 2015-12-02 NOTE — Progress Notes (Signed)
STROKE TEAM PROGRESS NOTE   HISTORY OF PRESENT ILLNESS (per record) Carl Adelson Sr. is an 55 y.o. male history of hypertension, end-stage renal disease on dialysis, GI bleed, diverticulitis and anemia, presenting with acute onset of right upper extremity numbness with shaking of his right upper extremity and to lesser extent twitching of the right side of his face at 5:50 AM this morning 12/02/2015 ([facility]). He complained of weakness of his right upper extremity arriving in the emergency room. Speech was unchanged. He had no weakness no numbness involving right lower extremity. There is an equivocal history of previous stroke. He's been taking aspirin daily. He is on dialysis and was on his way to dialysis when symptoms occurred this morning. CT scan of his head showed no acute intracranial abnormality. Deficits resolved and NIH stroke score at time of this evaluation was 0, therefore patient was not administered IV t-PA. He was admitted for further evaluation and treatment.   SUBJECTIVE (INTERVAL HISTORY) Patient states he developed sudden RUE jerking, numbness and weakness y`day but is now improving. He denies prior h/o seizures or strokes.He is currently getting hemodialysis   OBJECTIVE Temp:  [97.8 F (36.6 C)-98 F (36.7 C)] 97.9 F (36.6 C) (10/20 1134) Pulse Rate:  [73-97] 79 (10/20 1134) Resp:  [11-18] 18 (10/20 1134) BP: (115-138)/(80-113) 124/85 (10/20 1134) SpO2:  [98 %-100 %] 100 % (10/20 1134) Weight:  [88.9 kg (196 lb)] 88.9 kg (196 lb) (10/20 0637)  CBC:  Recent Labs Lab 12/02/15 0626 12/02/15 0655  WBC 6.5  --   NEUTROABS 4.3  --   HGB 10.0* 10.5*  HCT 31.3* 31.0*  MCV 81.7  --   PLT 266  --     Basic Metabolic Panel:  Recent Labs Lab 12/02/15 0626 12/02/15 0655  NA 136 136  K 6.1* 5.9*  CL 96* 100*  CO2 23  --   GLUCOSE 81 78  BUN 101* 98*  CREATININE 13.63* 14.60*  CALCIUM 8.8*  --     Lipid Panel:    Component Value Date/Time   CHOL 129  10/02/2015 0347   TRIG 64 10/02/2015 0347   HDL 44 10/02/2015 0347   CHOLHDL 2.9 10/02/2015 0347   VLDL 13 10/02/2015 0347   LDLCALC 72 10/02/2015 0347   HgbA1c:  Lab Results  Component Value Date   HGBA1C 4.8 10/02/2015   Urine Drug Screen: No results found for: LABOPIA, COCAINSCRNUR, LABBENZ, AMPHETMU, Ainsley Spinner    IMAGING  Mr Brain Wo Contrast  Result Date: 12/02/2015 CLINICAL DATA:  55 year old male with acute numbness and uncontrolled movements of the right upper extremity. Initial encounter. Chronic renal failure. EXAM: MRI HEAD WITHOUT CONTRAST MRA HEAD WITHOUT CONTRAST TECHNIQUE: Multiplanar, multiecho pulse sequences of the brain and surrounding structures were obtained without intravenous contrast. Angiographic images of the head were obtained using MRA technique without contrast. COMPARISON:  Head CT without contrast 0639 hours today. Brain MRI and intracranial MRV 05/17/2015 and 05/12/2015. Head CT without contrast 02/05/2015. Brain MRI and intracranial MRA 05/23/2014, and earlier FINDINGS: MRI HEAD FINDINGS Brain: No restricted diffusion to suggest acute infarction. No midline shift, mass effect, evidence of mass lesion, ventriculomegaly, extra-axial collection or acute intracranial hemorrhage. Cervicomedullary junction and pituitary are within normal limits. Chronic severe abnormal dural thickening and calcification as demonstrated by CT. Stable gray and white matter signal since March 2017. 2016 abnormal T2 and FLAIR hyperintensity in the right parietal lobe appears resolved without encephalomalacia. Chronic lacunar infarct anterior left corona radiata and  caudate again noted. No cortical encephalomalacia or chronic cerebral blood products identified. Vascular: Major intracranial vascular flow voids are stable, although there is chronically an abnormal appearance of the superior sagittal sinus in conjunction with chronic but occur dura thickening and calcification with as the  presence but since at [place]. The intracranial arterial flow voids appear stable and normal; azygos type proximal ACA anatomy. Skull and upper cervical spine: Negative visualized cervical spine. Stable bone marrow signal. Sinuses/Orbits: Postoperative changes to the globes since March. Otherwise negative orbit soft tissues. Visualized paranasal sinuses and mastoids are stable and well pneumatized ; chronic mild left petrous apex fluid. Other: Other Visible internal auditory structures appear normal. Chronic Tornwaldt cyst. MRA HEAD FINDINGS Antegrade flow in the posterior circulation with dominant distal left vertebral artery. No distal vertebral stenosis. Both PICA origins appear patent. Vertebrobasilar junction is patent. No basilar stenosis. Left AICA, bilateral SCA and bilateral PCA origins remain normal. Posterior communicating arteries are diminutive or absent. Bilateral PCA branches remain normal. Stable antegrade flow in both ICA siphons. Irregularity along the cavernous right ICA (series 6, image 101) appears stable since 2016 and could be related to atherosclerotic pseudo lesion or a small unusual vessel origin infundibulum. No right ICA siphon stenosis. Mild irregularity and stenosis at the left ICA anterior genu is stable. Ophthalmic artery origins remain normal. Carotid termini, MCA and ACA origins appear stable and normal. Dominant left ACA A1 segment. Azygos type proximal ACA anatomy (normal variant). Visible ACA branches remain normal. MCA M1 segments, MCA bifurcations, and visualized bilateral MCA branches remain normal. IMPRESSION: 1.  No acute intracranial abnormality. 2. Mild chronic small vessel disease appears stable since March. 2016 right parietal signal abnormality which was felt related to PRES has resolved without encephalomalacia. 3. 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 This appears stable since the MRV on 05/17/2015.  4. Stable since 2016 and largely unremarkable intracranial MRA. ICA siphon atherosclerosis without stenosis. Small right cavernous ICA segment pseudo-lesion versus infundibulum. Electronically Signed   By: Genevie Ann M.D.   On: 12/02/2015 11:09   Mr Jodene Nam Head/brain KP Cm  Result Date: 12/02/2015 CLINICAL DATA:  55 year old male with acute numbness and uncontrolled movements of the right upper extremity. Initial encounter. Chronic renal failure. EXAM: MRI HEAD WITHOUT CONTRAST MRA HEAD WITHOUT CONTRAST TECHNIQUE: Multiplanar, multiecho pulse sequences of the brain and surrounding structures were obtained without intravenous contrast. Angiographic images of the head were obtained using MRA technique without contrast. COMPARISON:  Head CT without contrast 0639 hours today. Brain MRI and intracranial MRV 05/17/2015 and 05/12/2015. Head CT without contrast 02/05/2015. Brain MRI and intracranial MRA 05/23/2014, and earlier FINDINGS: MRI HEAD FINDINGS Brain: No restricted diffusion to suggest acute infarction. No midline shift, mass effect, evidence of mass lesion, ventriculomegaly, extra-axial collection or acute intracranial hemorrhage. Cervicomedullary junction and pituitary are within normal limits. Chronic severe abnormal dural thickening and calcification as demonstrated by CT. Stable gray and white matter signal since March 2017. 2016 abnormal T2 and FLAIR hyperintensity in the right parietal lobe appears resolved without encephalomalacia. Chronic lacunar infarct anterior left corona radiata and caudate again noted. No cortical encephalomalacia or chronic cerebral blood products identified. Vascular: Major intracranial vascular flow voids are stable, although there is chronically an abnormal appearance of the superior sagittal sinus in conjunction with chronic but occur dura thickening and calcification with as the presence but since at [place]. The intracranial arterial flow voids appear stable and normal;  azygos type proximal ACA anatomy. Skull and upper cervical spine: Negative visualized cervical spine. Stable bone marrow signal. Sinuses/Orbits: Postoperative changes to the globes since March. Otherwise negative orbit soft tissues. Visualized paranasal sinuses and mastoids are stable and well pneumatized ; chronic mild left petrous apex fluid. Other: Other Visible internal auditory structures appear normal. Chronic Tornwaldt cyst. MRA HEAD FINDINGS Antegrade flow in the posterior circulation with dominant distal left vertebral artery. No distal vertebral stenosis. Both PICA origins appear patent. Vertebrobasilar junction is patent. No basilar stenosis. Left AICA, bilateral SCA and bilateral PCA origins remain normal. Posterior communicating arteries are diminutive or absent. Bilateral PCA branches remain normal. Stable antegrade flow in both ICA siphons. Irregularity along the cavernous right ICA (series 6, image 101) appears stable since 2016 and could be related to atherosclerotic pseudo lesion or a small unusual vessel origin infundibulum. No right ICA siphon stenosis. Mild irregularity and stenosis at the left ICA anterior genu is stable. Ophthalmic artery origins remain normal. Carotid termini, MCA and ACA origins appear stable and normal. Dominant left ACA A1 segment. Azygos type proximal ACA anatomy (normal variant). Visible ACA branches remain normal. MCA M1 segments, MCA bifurcations, and visualized bilateral MCA branches remain normal. IMPRESSION: 1.  No acute intracranial abnormality. 2. Mild chronic small vessel disease appears stable since March. 2016 right parietal signal abnormality which was felt related to PRES has resolved without encephalomalacia. 3. 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 This appears stable since the MRV on 05/17/2015. 4. Stable since 2016 and largely unremarkable intracranial MRA. ICA siphon atherosclerosis  without stenosis. Small right cavernous ICA segment pseudo-lesion versus infundibulum. Electronically Signed   By: Genevie Ann M.D.   On: 12/02/2015 11:09   Ct Head Code Stroke W/o Cm  Result Date: 12/02/2015 CLINICAL DATA:  Code stroke. Initial evaluation for acute right arm weakness. EXAM: CT HEAD WITHOUT CONTRAST TECHNIQUE: Contiguous axial images were obtained from the base of the skull through the vertex without intravenous contrast. COMPARISON:  Prior CT from 02/05/2015. FINDINGS: Brain: Cerebral volume is stable. Remote lacunar infarct within the right caudate head, stable. No acute intracranial hemorrhage. No evidence for acute large vessel territory infarct. Gray-white matter differentiation maintained. Deep gray nuclei maintained. No mass lesion, midline shift or mass effect. No hydrocephalus. Calvarium intact. No extra-axial fluid collection. Prominent dural calcifications. Vascular: Skull: Scalp soft tissues within normal limits. Sinuses/Orbits: Globes and orbital soft tissues within normal limits. Paranasal sinuses are clear. No mastoid effusion. ASPECTS [facility] Stroke Program Early CT Score) - Ganglionic level infarction (caudate, lentiform nuclei, internal capsule, insula, M1-M3 cortex): 7 - Supraganglionic infarction (M4-M6 cortex): 3 Total score (0-10 with 10 being normal): 10 IMPRESSION: 1. No acute intracranial process identified. 2. ASPECTS is 10 3. Remote left basal ganglia lacunar infarct. 4. Prominent dural calcifications, stable. Critical Value/emergent results were called by telephone at the time of interpretation on 12/02/2015 at 6:53 am to Dr. Nicole Kindred , who verbally acknowledged these results. Electronically Signed   By: Jeannine Boga M.D.   On: 12/02/2015 06:55   2D Echocardiogram  Left ventricle: The cavity size was normal. Wall thickness was increased in a pattern of mild LVH. There was moderate focal basal hypertrophy of the septum. Systolic function was normal. The  estimated ejection fraction was in the range of 60% to 65%. Wall motion was normal; there were no regional wall motion abnormalities. Doppler parameters are consistent with abnormal left ventricular relaxation (grade 1 diastolic  dysfunction). The E/e&' ratio is <8, suggesting normal LV filling pressure. - Left atrium: The atrium was mildly dilated. - Right atrium: The atrium was mildly dilated. - Inferior vena cava: The vessel was normal in size. The respirophasic diameter changes were in the normal range (>= 50%), consistent with normal central venous pressure. Impressions: - Compared to a prior study in 2016, the LVEF is higher at 60-65%. There is mild LVH with moderate basal septal hypertrophy, normal wall motion and mild biatrial enlargement.   PHYSICAL EXAM Frail middle aged african [place] male not in distress.  . Afebrile. Head is nontraumatic. Neck is supple without bruit.    Cardiac exam no murmur or gallop. Lungs are clear to auscultation. Distal pulses are well felt. Neurological Exam ;  Awake  Alert oriented x 3. Normal speech and language.eye movements full without nystagmus.fundi were not visualized. Vision acuity and fields appear normal. Hearing is normal. Palatal movements are normal. Face symmetric. Tongue midline. Normal strength, tone, reflexes and coordination. Normal sensation. Gait deferred.  ASSESSMENT/PLAN Mr. Jacobey Gura Sr. is a 55 y.o. male with history of hypertension, end-stage renal disease on dialysis, GI bleed, diverticulitis and anemia presenting with numbness and uncontrolled movements of right upper extremity. He did not receive IV t-PA due to deficits resolved.   RUE jerking and numbness doubt TIA. Seizure possible verus involuntary movement  MRI  No acute abnormality small vessel disease   MRA  Chronic dural thickening stable  Carotid Doppler  pending   2D Echo  .EF 60-65%. No source of embolus   EEG pending   LDL 72  HgbA1c 4.8  Heparin  5000 units sq tid for VTE prophylaxis  Diet renal with fluid restriction Fluid restriction: 1200 mL Fluid; Room service appropriate? 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 He presented with transient RUE jerking and numbness with negative MRI for ischemia exact etiology of episode is unclear. Continue ongoing work up.Continue aspirin and check EEG.Greater than 50% time during this 25 minute visit was spent on counselling and coordination of care about TIA/seizure and answering questions.  Antony Contras, MD Medical Director [facility] Pager: [phone] 12/02/2015 6:01 PM   To contact Stroke Continuity provider, please refer to [email or web address] After hours, contact General Neurology

## 2015-12-02 NOTE — Progress Notes (Signed)
EEG Completed; Results Pending  

## 2015-12-03 ENCOUNTER — Observation Stay (HOSPITAL_BASED_OUTPATIENT_CLINIC_OR_DEPARTMENT_OTHER): Payer: Medicare Other

## 2015-12-03 DIAGNOSIS — R569 Unspecified convulsions: Secondary | ICD-10-CM

## 2015-12-03 DIAGNOSIS — G459 Transient cerebral ischemic attack, unspecified: Secondary | ICD-10-CM | POA: Diagnosis not present

## 2015-12-03 DIAGNOSIS — G40209 Localization-related (focal) (partial) symptomatic epilepsy and epileptic syndromes with complex partial seizures, not intractable, without status epilepticus: Secondary | ICD-10-CM | POA: Diagnosis not present

## 2015-12-03 LAB — CBC
HCT: 32.3 % — ABNORMAL LOW (ref 39.0–52.0)
Hemoglobin: 10.2 g/dL — ABNORMAL LOW (ref 13.0–17.0)
MCH: 26 pg (ref 26.0–34.0)
MCHC: 31.6 g/dL (ref 30.0–36.0)
MCV: 82.4 fL (ref 78.0–100.0)
Platelets: 235 10*3/uL (ref 150–400)
RBC: 3.92 MIL/uL — ABNORMAL LOW (ref 4.22–5.81)
RDW: 18.1 % — ABNORMAL HIGH (ref 11.5–15.5)
WBC: 4.4 10*3/uL (ref 4.0–10.5)

## 2015-12-03 LAB — BASIC METABOLIC PANEL
Anion gap: 12 (ref 5–15)
BUN: 34 mg/dL — ABNORMAL HIGH (ref 6–20)
CO2: 31 mmol/L (ref 22–32)
Calcium: 9.7 mg/dL (ref 8.9–10.3)
Chloride: 93 mmol/L — ABNORMAL LOW (ref 101–111)
Creatinine, Ser: 7.62 mg/dL — ABNORMAL HIGH (ref 0.61–1.24)
GFR calc Af Amer: 8 mL/min — ABNORMAL LOW (ref >60)
GFR calc non Af Amer: 7 mL/min — ABNORMAL LOW (ref >60)
Glucose, Bld: 81 mg/dL (ref 65–99)
Potassium: 5 mmol/L (ref 3.5–5.1)
Sodium: 136 mmol/L (ref 135–145)

## 2015-12-03 MED ORDER — ONDANSETRON HCL 4 MG PO TABS
4.0000 mg | ORAL_TABLET | Freq: Three times a day (TID) | ORAL | Status: DC | PRN
  Filled 2015-12-03: qty 1

## 2015-12-03 MED ORDER — LEVETIRACETAM 750 MG PO TABS: 750.0000 mg | ORAL_TABLET | Freq: Two times a day (BID) | ORAL | 3 refills | Status: DC

## 2015-12-03 NOTE — Progress Notes (Signed)
Iv d/c'd, tele removed, understands d/c instructions, home now w/renae, his wife.

## 2015-12-03 NOTE — Progress Notes (Signed)
STROKE TEAM PROGRESS NOTE   HISTORY OF PRESENT ILLNESS (per record) Carl Bordonaro Sr. is an 55 y.o. male history of hypertension, end-stage renal disease on dialysis, GI bleed, diverticulitis and anemia, presenting with acute onset of right upper extremity numbness with shaking of his right upper extremity and to lesser extent twitching of the right side of his face at 5:50 AM this morning 12/02/2015 (LKW). He complained of weakness of his right upper extremity arriving in the emergency room. Speech was unchanged. He had no weakness no numbness involving right lower extremity. There is an equivocal history of previous stroke. He's been taking aspirin daily. He is on dialysis and was on his way to dialysis when symptoms occurred this morning. CT scan of his head showed no acute intracranial abnormality. Deficits resolved and NIH stroke score at time of this evaluation was 0, therefore patient was not administered IV t-PA. He was admitted for further evaluation and treatment.   SUBJECTIVE (INTERVAL HISTORY) Patient states he developed sudden RUE jerking, numbness and weakness the day of admission.  However, the day prior to admission, he also had right facial jerking.  He reported "skipping dialysis" the day prior to admission and believed symptoms were related to that which is why he did not seek care until the day following the jerking in the face.  His only ROS complaint was slight unsteadiness  OBJECTIVE Temp:  [97.7 F (36.5 C)-99.1 F (37.3 C)] 98 F (36.7 C) (10/21 1023) Pulse Rate:  [73-103] 85 (10/21 1023) Cardiac Rhythm: Normal sinus rhythm;Bundle branch block (10/21 0700) Resp:  [12-19] 19 (10/21 1023) BP: (95-125)/(50-85) 100/65 (10/21 1023) SpO2:  [97 %-100 %] 98 % (10/21 1023) Weight:  [85.6 kg (188 lb 11.4 oz)-87.6 kg (193 lb 2 oz)] 85.6 kg (188 lb 11.4 oz) (10/20 1741)  CBC:   Recent Labs Lab 12/02/15 0626 12/02/15 0655 12/03/15 0733  WBC 6.5  --  4.4  NEUTROABS 4.3  --    --   HGB 10.0* 10.5* 10.2*  HCT 31.3* 31.0* 32.3*  MCV 81.7  --  82.4  PLT 266  --  161    Basic Metabolic Panel:   Recent Labs Lab 12/02/15 0626 12/02/15 0655 12/03/15 0733  NA 136 136 136  K 6.1* 5.9* 5.0  CL 96* 100* 93*  CO2 23  --  31  GLUCOSE 81 78 81  BUN 101* 98* 34*  CREATININE 13.63* 14.60* 7.62*  CALCIUM 8.8*  --  9.7    Lipid Panel:     Component Value Date/Time   CHOL 129 10/02/2015 0347   TRIG 64 10/02/2015 0347   HDL 44 10/02/2015 0347   CHOLHDL 2.9 10/02/2015 0347   VLDL 13 10/02/2015 0347   LDLCALC 72 10/02/2015 0347   HgbA1c:  Lab Results  Component Value Date   HGBA1C 4.8 10/02/2015   Urine Drug Screen: No results found for: LABOPIA, COCAINSCRNUR, LABBENZ, AMPHETMU, THCU, LABBARB    IMAGING  Mr [name] Head/brain Wo Cm 12/02/2015 1. No acute intracranial abnormality.  2. Mild chronic small vessel disease appears stable since March. 2016 right parietal signal abnormality which was felt related to PRES has resolved without encephalomalacia.  3. 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 This appears stable since the MRV on 05/17/2015.  4. Stable since 2016 and largely unremarkable intracranial MRA. ICA siphon atherosclerosis without stenosis. Small right cavernous ICA segment pseudo-lesion versus infundibulum.     Ct Head Code Stroke  W/o Cm 12/02/2015 1. No acute intracranial process identified.  2. ASPECTS is 10  3. Remote left basal ganglia lacunar infarct.  4. Prominent dural calcifications, stable.      2D Echocardiogram  10/03/2015 Left ventricle: The cavity size was normal. Wall thickness was increased in a pattern of mild LVH. There was moderate focal basal hypertrophy of the septum. Systolic function was normal. The estimated ejection fraction was in the range of 60% to 65%. Wall motion was normal; there were no regional wall motion abnormalities. Doppler parameters are  consistent with abnormal left ventricular relaxation (grade 1 diastolic dysfunction). The E/e&' ratio is <8, suggesting normal LV filling pressure. - Left atrium: The atrium was mildly dilated. - Right atrium: The atrium was mildly dilated. - Inferior vena cava: The vessel was normal in size. The respirophasic diameter changes were in the normal range (>= 50%), consistent with normal central venous pressure. Impressions: - Compared to a prior study in 2016, the LVEF is higher at 60-65%. There is mild LVH with moderate basal septal hypertrophy, normal wall motion and mild biatrial enlargement.   EEG 12/02/2015 Impression: This awake and asleep EEG is within normal limits.     PHYSICAL EXAM Frail middle aged african [place] male not in distress.  . Afebrile. Head is nontraumatic. Neck is supple without bruit.    Cardiac exam no murmur or gallop. Lungs are clear to auscultation. Distal pulses are well felt. Neurological Exam ;  Awake  Alert oriented x 3. Normal speech and language.eye movements full without nystagmus.fundi were not visualized. Vision acuity and fields appear normal. Hearing is normal. Palatal movements are normal. Face symmetric. Tongue midline. Normal strength, tone, reflexes and coordination. Normal sensation. Gait deferred.   ASSESSMENT/PLAN [name] is a 55 y.o. male with history of hypertension, end-stage renal disease on dialysis, GI bleed, diverticulitis and anemia presenting with numbness and uncontrolled movements of right upper extremity. He did not receive IV t-PA due to deficits resolved.   RUE jerking and numbness doubt TIA. Seizure possible verus involuntary movement  MRI  No acute abnormality small vessel disease   MRA  Chronic dural thickening stable  Carotid Doppler - Preliminary report:  1-39% ICA plaquing. Vertebral artery flow is antegrade.   2D Echo - EF 60-65%. No source of embolus   EEG - within normal limits  LDL  72  HgbA1c 4.8  Heparin 5000 units sq tid for VTE prophylaxis Diet renal with fluid restriction Fluid restriction: 1200 mL Fluid; Room service appropriate? Yes; Fluid consistency: Thin  aspirin 81 mg daily prior to admission, now on aspirin 81 mg daily  Patient counseled to be compliant with his antithrombotic medications  Ongoing aggressive stroke risk factor management  Therapy recommendations:  No follow up therapy recommended.  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 Documentation  reflects findings. The laboratory and radiographic studies reviewed by me. ROS completed by me personally and pertinent positives fully documented   Condition: stable  Assessment and plan completed by me personally and fully documented above. Plans/Recommendations include:     Patient with two separate incidents of involuntary twitching without loss of consciousness.  By description these sounded like partial focal seizures  Given the likely electrolyte abnormalities with renal disease and following HD non-compliance, this was certainly possible and we discussed this.  However, given the two events separated by time, I have contacted the primary team and recommended Keppra 750mg  BID with Neurology outpatient follow-up  Patient should be advised to not drive until cleared by  Neurology  Outpatient Nephrology may need to dose Keppra around HD as well  SIGNED BY: Dr. Elissa Hefty    To contact Stroke Continuity provider, please refer to [email or web address] After hours, contact General Neurology

## 2015-12-03 NOTE — Evaluation (Signed)
Occupational Therapy Evaluation Patient Details Name: Carl Zabriskie Sr. MRN: [id number] DOB: [birth year] Today's Date: 12/03/2015    History of Present Illness pt presents with R UE weakness and decreased coordination.  pt with hx of CVA, HTN, ESRD, CKD, and Anemia.     Clinical Impression   Pt reports he was independent with ADL PTA and is currently at his functional baseline with all deficits resolved. Pt able to demo higher level balance activities without unsteadiness and RUE strength, ROM, and coordination WFL. Educated pt on signs and symptoms of CVA. Pt planning to d/c home with family support. No further acute OT needs identified; signing off at this time. Please re-consult if needs change. Thank you for this referral.    Follow Up Recommendations  No OT follow up    Equipment Recommendations  None recommended by OT    Recommendations for Other Services       Precautions / Restrictions Precautions Precautions: None Restrictions Weight Bearing Restrictions: No      Mobility Bed Mobility               General bed mobility comments: Pt OOB in chair upon arrival.  Transfers Overall transfer level: Independent Equipment used: None                  Balance Overall balance assessment: No apparent balance deficits (not formally assessed)                                          ADL Overall ADL's : Independent                                       General ADL Comments: Pt able to perform higher level balance activities without unsteadiness or LOB. Educated pt on signs and symptoms of stroke and need to return to hospital if experiencing.     Vision Vision Assessment?: No apparent visual deficits   Perception     Praxis      Pertinent Vitals/Pain Pain Assessment: No/denies pain     Hand Dominance Right   Extremity/Trunk Assessment Upper Extremity Assessment Upper Extremity Assessment: Overall WFL for  tasks assessed (all RUE deficits have resolved)   Lower Extremity Assessment Lower Extremity Assessment: Defer to PT evaluation   Cervical / Trunk Assessment Cervical / Trunk Assessment: Normal   Communication Communication Communication: No difficulties   Cognition Arousal/Alertness: Awake/alert Behavior During Therapy: WFL for tasks assessed/performed Overall Cognitive Status: Within Functional Limits for tasks assessed                     General Comments       Exercises       Shoulder Instructions      Home Living Family/patient expects to be discharged to:: Private residence Living Arrangements: Spouse/significant other Available Help at Discharge: Family;Friend(s);Available PRN/intermittently Type of Home: House Home Access: Stairs to enter [employer] of Steps: "couple" Entrance Stairs-Rails: Left Home Layout: One level     Bathroom Shower/Tub: Occupational psychologist: Standard     Home Equipment: None          Prior Functioning/Environment Level of Independence: Independent        Comments: pt drives himself to/from HD and no deficits after HD.  OT Problem List:     OT Treatment/Interventions:      OT Goals(Current goals can be found in the care plan section) Acute Rehab OT Goals Patient Stated Goal: Home OT Goal Formulation: All assessment and education complete, DC therapy  OT Frequency:     Barriers to D/C:            Co-evaluation              End of Session    Activity Tolerance: Patient tolerated treatment well Patient left: in chair   Time: 3716-9678 OT Time Calculation (min): 11 min Charges:  OT General Charges $OT Visit: 1 Procedure OT Evaluation $OT Eval Low Complexity: 1 Procedure G-Codes: OT G-codes **NOT FOR INPATIENT CLASS** Functional Assessment Tool Used: Clinical judgement Functional Limitation: Self care Self Care Current Status (L3810): 0 percent impaired, limited  or restricted Self Care Goal Status (F7510): 0 percent impaired, limited or restricted Self Care Discharge Status (C5852): 0 percent impaired, limited or restricted   Binnie Kand M.S., OTR/L Pager: [phone]  12/03/2015, 11:52 AM

## 2015-12-03 NOTE — Progress Notes (Signed)
Vomited unmeasurable amount in trash can,  Administered po zofran per order.

## 2015-12-03 NOTE — Progress Notes (Signed)
  Date: 12/03/2015  Patient name: Carl Wacha Sr.  Medical record number: [id number]  Date of birth: [birth year]   I have seen and evaluated Carl Found Sr. and discussed their care with the Residency Team. In brief, patient is a 55 y/o male with PMH of ESRD on HD, HTN, CVA who p/w sudden onset R arm weakness * 1 day. Patient noticed twitching of his R arm with associated weakness when he was starting his car. He also noted assoc twitching of the right side of his face. No LOC, no bowel or bladder incontinence, no abd pain, no fevers/chills, no n/v, no CP, no sob, no palpitations, no dysarthria. Patient symptoms resolved rapidly after coming to the ED and he feels well today with no new complaints. His strength is back at baseline and twitching has resolved  PMHx, Fam Hx, and/or Soc Hx : As per resident admit note  Vitals:   12/03/15 0537 12/03/15 1023  BP: (!) 95/50 100/65  Pulse: 93 85  Resp: 16 19  Temp: 99.1 F (37.3 C) 98 F (36.7 C)   Gen: AAO*3, NAD CVS: RRR, normal heart sounds Lungs: CTA b/l Abd: soft, non tender, BS + Ext: no edema Neuro: no focal deficits noted on exam, strength is 5/5 b/l UE and LE, sensation is intact  Assessment and Plan: I have seen and evaluated the patient as outlined above. I agree with the formulated Assessment and Plan as detailed in the residents' note, with the following changes:   1. Likely TIA: - Patient presented with acute onset of R arm weakness which quickly resolved with no residual deficits consistent with TIA - Neuro f/u noted - given focal twitching in R arm and face there was concern for a partial seizure but EEG was wnl. It is possible that the EEG was wnl because he was not having an episode at the time this was done. Resident discussed with neuro who are considering starting keppra for possible partial seizures - No evidence of new CVA on  MRI. C/w asa, statin - Likely d/c home today if carotid dopplers wnl  Aldine Contes, MD 10/21/201712:40 PM

## 2015-12-03 NOTE — Progress Notes (Addendum)
   Subjective: Patient feels well today and is requesting to go home. His symptoms have resolved and he is back to his baseline.  Objective:  Vital signs in last 24 hours: Vitals:   12/03/15 0005 12/03/15 0135 12/03/15 0537 12/03/15 1023  BP: 110/76 107/73 (!) 95/50 100/65  Pulse: 84 86 93 85  Resp: 18 18 16 19   Temp: 98.4 F (36.9 C) 98.5 F (36.9 C) 99.1 F (37.3 C) 98 F (36.7 C)  TempSrc: Oral Oral Oral Oral  SpO2: 97% 98% 98% 98%  Weight:      Height:       Physical Exam Constitutional: NAD, appears comfortable  Cardiovascular: RRR, no murmurs, rubs, or gallops.  Pulmonary/Chest: CTAB, no wheezes, rales, or rhonchi. Abdominal: Soft, non tender, non distended. +BS.  Extremities: Warm and well perfused. Distal pulses intact. No edema.  Neurologic exam: Mental status: A&Ox3 Cranial Nerves: II: PERRL III, IV, VI: Extra-occular motions intact bilaterally V, VII: Face symmetric, sensation intact in all 3 divisions  VIII: hearing normal to rubbing fingers bilaterally  IX, X: palate rises symmetrically XI: Head turn and shoulder shrug normal bilaterally  XII: tongue midline  Motor: Strength 5/5 on all upper and lower extremities, bulk muscle and tone are normal  Deep Tendon Reflexes: 2+ symmetric   Sensory: Light touch intact and symmetric bilaterally   Assessment/Plan:  Right Arm Weakness: With jerking movement and facial twitching. Symptoms were transient and now resolved. CT head negative. Etiology concerning for seizure vs TIA vs. Complicated migraine. Patient endorsed a HA after onset of symptoms. Recent A1C in August was 4.8 and LDL was 72. Patient was seen by Dr. Erlinda Hong (neurology) in clinic back in september for hospital follow up after a head injury. Patient had MRI findings at that time concerning for possible PRES vs. Chronic meningitis vs hypertrophic pachymeningitis vs partial  CVT. Per Dr. Erlinda Hong note, plan was for repeat MRI in 6 months. MRI this admission showed no acute abnormalities but stable chronic changes including small vessel disease and chronic severe dural thickening and calcification. Right parietal signal abnormalities felt to be related to PRES on prior MRI has now resolved. Patient is back to baseline today with no neurological deficits.  -- Neurology consulted, appreciate recommendations  -- Carotid dopplers today -- EEG yesterday negative for seizure activity  -- Continue statin -- ASA 81 daily -- PT/OT and speech evaluations with no follow up needs  -- Follow up with PCP and outpatient neurology   ESRD on HD:  -- Nephrology consulted; appreciate recs -- Received dialysis yesterday, will now resume normal schedule   Chronic Headaches: -- Continue home Fioricet   FEN: fluid restrict, hemodialysis, renal diet VTE ppx: Heparin  Code Status: FULL    Dispo: Anticipated discharge today.   Velna Ochs, MD 12/03/2015, 11:17 AM Pager: [phone]

## 2015-12-03 NOTE — Progress Notes (Signed)
[facility] Progress Note   Subjective: no new c/o's.   Vitals:   12/03/15 0005 12/03/15 0135 12/03/15 0537 12/03/15 1023  BP: 110/76 107/73 (!) 95/50 100/65  Pulse: 84 86 93 85  Resp: 18 18 16 19   Temp: 98.4 F (36.9 C) 98.5 F (36.9 C) 99.1 F (37.3 C) 98 F (36.7 C)  TempSrc: Oral Oral Oral Oral  SpO2: 97% 98% 98% 98%  Weight:      Height:        Inpatient medications: . aspirin  81 mg Oral Daily  . atorvastatin  10 mg Oral q1800  . calcium acetate  2,001 mg Oral TID WC  . cinacalcet  90 mg Oral BID WC  . heparin  5,000 Units Subcutaneous Q8H  . lanthanum  1,000 mg Oral TID WC  . multivitamin  1 tablet Oral QHS     butalbital-acetaminophen-caffeine, docusate sodium, ondansetron  Exam: Gen: Well developed, well nourished, in no acute distress. Head: Normocephalic, atraumatic, sclera non-icteric, mucus membranes are moist. Neck: Supple without lymphadenopathy/masses. JVD not elevated. Lungs: Clear bilaterally to auscultation without wheezes, rales, or rhonchi. Breathing is unlabored. Heart: RRR with normal S1, S2. No murmurs, rubs, or gallops appreciated. Abdomen: Soft, non-tender, non-distended with normoactive bowel sounds. No rebound/guarding.  Musculoskeletal:  Strength and tone appear normal for age. Lower extremities: No edema or ischemic changes, no open wounds. Neuro: Alert and oriented X 3. Moves all extremities spontaneously. Psych:  Responds to questions appropriately with a normal affect. Dialysis Access: R forearm AVF + thrill/bruit   Dialysis Orders:  MWF at [facility] 4:30 mins, 2K/2Ca bath, EDW 85kg, AVF, BFR 500/DFR A1.5 - No heparin - Mircera 12mcg IV q 2 weeks, last given 11/02/15. - Hectoral 50mcg IV q HD  Assessment: 1.  R arm/ face spasm/ twitching, transient/ resolved: no evidence of CVA by MRI, and EEG normal. May have been a cramp.  Symptoms resolved. No hypoCa or hypoK.  2.  ESRD: Continue HD per MWF schedule.   3.   Hypertension/volume: BP controlled now, has been getting low side often with outpt HD. Spoke to neurologist, no need to avoid hypotension specifically as he did not have a stroke and this episode was not precipitated by hypotension episode. That being said, may need to raise EDW on dc.  4.  Anemia: Hgb 10.5. No ESA for now. 5.  Metabolic bone disease: Ca 8.8; continue binders/VDRA/sensipar. 6.  Nutrition:  Alb 2.9. Continue high protein diet. Adding Nepro.  Plan - raise dry wt to 87kg at dc.  OK for dc from renal standpoint.  Possible dc today.    Kelly Splinter MD [facility] pager [phone]   12/03/2015, 12:32 PM    Recent Labs Lab 12/02/15 0626 12/02/15 0655 12/03/15 0733  NA 136 136 136  K 6.1* 5.9* 5.0  CL 96* 100* 93*  CO2 23  --  31  GLUCOSE 81 78 81  BUN 101* 98* 34*  CREATININE 13.63* 14.60* 7.62*  CALCIUM 8.8*  --  9.7    Recent Labs Lab 12/02/15 0626  AST 14*  ALT 10*  ALKPHOS 65  BILITOT 0.3  PROT 7.6  ALBUMIN 2.9*    Recent Labs Lab 12/02/15 0626 12/02/15 0655 12/03/15 0733  WBC 6.5  --  4.4  NEUTROABS 4.3  --   --   HGB 10.0* 10.5* 10.2*  HCT 31.3* 31.0* 32.3*  MCV 81.7  --  82.4  PLT 266  --  235  Iron/TIBC/Ferritin/ %Sat    Component Value Date/Time   IRON 204 (H) 07/14/2014 0500   TIBC 239 (L) 07/14/2014 0500   FERRITIN 1,462 (H) 07/14/2014 0500   IRONPCTSAT 85 (H) 07/14/2014 0500

## 2015-12-03 NOTE — Progress Notes (Signed)
VASCULAR LAB PRELIMINARY  PRELIMINARY  PRELIMINARY  PRELIMINARY  Carotid duplex completed.    Preliminary report:  1-39% ICA plaquing. Vertebral artery flow is antegrade.   [name], RVT 12/03/2015, 10:49 AM

## 2015-12-03 NOTE — Evaluation (Signed)
Speech Language Pathology Evaluation Patient Details Name: Carl Elgersma Sr. MRN: [id number] DOB: 03-Jun-1960 Today's Date: 12/03/2015 Time: 4098-1191 SLP Time Calculation (min) (ACUTE ONLY): 21 min  Problem List:  Patient Active Problem List   Diagnosis Date Noted  . TIA (transient ischemic attack)   . Seizure-like activity ([place])   . Essential hypertension   . Seizures ([place])   . Acute pericarditis   . Atypical chest pain 10/01/2015  . Abnormal EKG 10/01/2015  . Chest pain 10/01/2015  . 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 GI bleed 08/01/2015  . Acute blood loss anemia   . Acute diverticulitis   . Lung nodule   . Diverticulitis of cecum 07/21/2015  . Diverticulitis of large intestine without perforation or abscess without bleeding   . Right lower quadrant pain   . Headache 05/05/2015  . 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 Hypertension secondary to other renal disorders 08/26/2014  . Diverticulosis 07/13/2014  . Secondary hyperparathyroidism ([place]) 07/13/2014  . Lower GI bleed 07/12/2014  . Hematochezia   . Acute posthemorrhagic anemia   . End stage renal disease ([place])   . Refusal of blood transfusions as patient is Jehovah's Witness   . Parotid mass   . HLD (hyperlipidemia)   . PRES (posterior reversible encephalopathy syndrome)   . Stroke ([place]) 05/23/2014  . CVA (cerebral infarction) 05/23/2014  . Anemia of renal disease 05/23/2014  . HTN (hypertension) 12/09/2012  . GERD (gastroesophageal reflux disease) 12/09/2012  . ESRD on dialysis ([place]) 12/09/2012  . Gastrointestinal bleeding 11/12/2012   Past Medical History:  Past Medical History:  Diagnosis Date  . Anemia   . Chronic headaches   . Chronic kidney disease    Chronic Renal Failure; On Renal Transplant List  . Diverticulitis   . GI bleed   . Hemodialysis patient ([place])    M-W-F  . Hypertension   . Presence of arteriovenous fistula for hemodialysis, primary ([place])    RUE  .  Refusal of blood product    NO WHOLE BLOOD PROUCTS  . Sleep apnea   . Stroke [facility])    Past Surgical History:  Past Surgical History:  Procedure Laterality Date  . AV FISTULA PLACEMENT Right    right arm  . COLONOSCOPY N/A 08/04/2015   Procedure: COLONOSCOPY;  Surgeon: Jerene Bears, MD;  Location: [facility] ENDOSCOPY;  Service: Endoscopy;  Laterality: N/A;  . graft left arm Left    for dialysis x 2  . INSERTION OF DIALYSIS CATHETER     Rt chest  . LAPAROSCOPIC RIGHT COLECTOMY N/A 08/05/2015   Procedure: LAPAROSCOPIC RIGHT COLECTOMY- ASCENDING;  Surgeon: Stark Klein, MD;  Location: [facility] OR;  Service: General;  Laterality: N/A;   HPI:  55 year old male with past medical history of chronic headaches, end-stage renal disease on hemodialysis, diverticulitis, hypertension, prior CVA, and sleep apnea who presents with right arm weakness. He also developed some twitching of the right side of his face but denies any difficulty speaking or slurred. His son denies any facial droop or slurred speech and the patient. On arrival to the emergency department patient states his right hand weakness has improved but is not back at baseline.In the ED CT of his head was negative for any acute intracranial abnormality. His right-sided arm weakness improved and he was not given tPA.   Assessment / Plan / Recommendation Clinical Impression   Pt presents at baseline level of cognitive-linguistic functioning.  Pt's speech is fluent and free from dysarthria  or word finding impairment.  Pt scored 25/30 on the MoCA (n >/=26) with deficits most notable for delayed recall.  Pt was able to freely recall 3 out of 5 words on delayed recall subtest, which improved to 5 out of 5 with mod assist verbal cues for choice of three. Therapist suspects this to be pt's baseline per pt report.  As a result, no further ST needs are indicated at this time.      SLP Assessment  Patient does not need any further Speech Lanaguage Pathology  Services    Follow Up Recommendations  None          SLP Evaluation Cognition  Overall Cognitive Status: Within Functional Limits for tasks assessed Orientation Level: Oriented X4       Comprehension  Auditory Comprehension Overall Auditory Comprehension: Appears within functional limits for tasks assessed    Expression Expression Primary Mode of Expression: Verbal Verbal Expression Overall Verbal Expression: Appears within functional limits for tasks assessed   Oral / Motor  Oral Motor/Sensory Function Overall Oral Motor/Sensory Function: Within functional limits Motor Speech Overall Motor Speech: Appears within functional limits for tasks assessed   GO          Functional Assessment Tool Used: Cognitive assessment completed Functional Limitations: Memory Memory Current Status (V9563): At least 1 percent but less than 20 percent impaired, limited or restricted Memory Goal Status (O7564): At least 1 percent but less than 20 percent impaired, limited or restricted         Emilio Math 12/03/2015, 9:27 AM

## 2015-12-03 NOTE — Progress Notes (Signed)
[facility] [address] pharmacy called, stated they could not verify MD Dr. Philipp Ovens, that sent over [place]. Teaching service notified, Dr. Charlynn Grimes sent over keppra to [place]. [facility] confirmed that they received prescription and "worked it out."

## 2015-12-04 ENCOUNTER — Telehealth: Payer: Self-pay | Admitting: Internal Medicine

## 2015-12-04 NOTE — Telephone Encounter (Signed)
  Reason for call:   I placed an outgoing call to [name] at 10:20 AM regarding his recent discharge from hospital.  I left a message for him to call our clinic back.  I also called his significant other, [name], whom was present with him during the hospitalization.  Per the chart, "01-12-2015 Patient authorizes all chmg practices to release information to son, [name], [phone] or [name] [phone] and to leave detailed message on home voicemail."  I left detailed information with [name].   Assessment/ Plan:   Patient was instructed to begin Keppra 750mg  BID to prevent further seizures per recommendations from neurology.  I advised that this prescription had been sent to his pharmacy yesterday.  Patient was aware of this medication addition.  Patient is advised to follow up with Dr. Erlinda [name] at [facility] in 2 weeks.  Patient was aware of this instruction.  Patient asked to call our clinic with any further questions.  All other questions answered as able.   Jule Ser, DO   12/04/2015, 10:23 AM

## 2015-12-05 DIAGNOSIS — N186 End stage renal disease: Secondary | ICD-10-CM | POA: Diagnosis not present

## 2015-12-05 DIAGNOSIS — N2581 Secondary hyperparathyroidism of renal origin: Secondary | ICD-10-CM | POA: Diagnosis not present

## 2015-12-05 DIAGNOSIS — Z23 Encounter for immunization: Secondary | ICD-10-CM | POA: Diagnosis not present

## 2015-12-05 DIAGNOSIS — E875 Hyperkalemia: Secondary | ICD-10-CM | POA: Diagnosis not present

## 2015-12-05 LAB — VAS US CAROTID
LEFT ECA DIAS: -10 cm/s
LEFT VERTEBRAL DIAS: -16 cm/s
Left CCA dist dias: -26 cm/s
Left CCA dist sys: -67 cm/s
Left CCA prox dias: 22 cm/s
Left CCA prox sys: 74 cm/s
Left ICA dist dias: -36 cm/s
Left ICA dist sys: -74 cm/s
Left ICA prox dias: -17 cm/s
Left ICA prox sys: -41 cm/s
RIGHT ECA DIAS: -9 cm/s
RIGHT VERTEBRAL DIAS: -14 cm/s
Right CCA prox dias: -16 cm/s
Right CCA prox sys: -70 cm/s
Right cca dist sys: -48 cm/s

## 2015-12-06 ENCOUNTER — Ambulatory Visit: Payer: Medicare Other | Admitting: Physician Assistant

## 2015-12-06 ENCOUNTER — Telehealth: Payer: Self-pay | Admitting: Neurology

## 2015-12-06 NOTE — Telephone Encounter (Signed)
Reviewed his chart about recent admission. Dr. Leonie Man saw him in the hospital and did not think he had stroke or TIA. Concerning for seizure like activity vs. involunteer movement, but EEG negative. Internal medicine service discharged him asking for 2 week follow up which is not we do routinely.   I think we should keep his appointment with me in March next year, but he can be see by Hoyle Sauer once in 4-6 weeks or so for a regular hospital follow up. Then I will continue to follow him up next year. Please also let pt know. Thanks.   Rosalin Hawking, MD PhD Stroke Neurology 12/06/2015 6:57 PM

## 2015-12-06 NOTE — Telephone Encounter (Signed)
Pt called to advise he had another stroke on 10/20 and was advised to schedule appt with Dr Erlinda Hong in 2wks.  Pt has an existing appt on 04/17/16. Dr Erlinda Hong is booked until 03/06/16. Is this patient a candidate to see Hoyle Sauer?

## 2015-12-07 DIAGNOSIS — N186 End stage renal disease: Secondary | ICD-10-CM | POA: Diagnosis not present

## 2015-12-07 DIAGNOSIS — N2581 Secondary hyperparathyroidism of renal origin: Secondary | ICD-10-CM | POA: Diagnosis not present

## 2015-12-07 DIAGNOSIS — E1129 Type 2 diabetes mellitus with other diabetic kidney complication: Secondary | ICD-10-CM | POA: Diagnosis not present

## 2015-12-07 DIAGNOSIS — E875 Hyperkalemia: Secondary | ICD-10-CM | POA: Diagnosis not present

## 2015-12-07 DIAGNOSIS — Z23 Encounter for immunization: Secondary | ICD-10-CM | POA: Diagnosis not present

## 2015-12-07 NOTE — Telephone Encounter (Signed)
IF patient calls back schedule him with Carolyn(NP) when Dr. Erlinda Hong is in the office. LFt vm for patient to call back to schedule hospital follow up.

## 2015-12-09 DIAGNOSIS — Z23 Encounter for immunization: Secondary | ICD-10-CM | POA: Diagnosis not present

## 2015-12-09 DIAGNOSIS — E875 Hyperkalemia: Secondary | ICD-10-CM | POA: Diagnosis not present

## 2015-12-09 DIAGNOSIS — N2581 Secondary hyperparathyroidism of renal origin: Secondary | ICD-10-CM | POA: Diagnosis not present

## 2015-12-09 DIAGNOSIS — N186 End stage renal disease: Secondary | ICD-10-CM | POA: Diagnosis not present

## 2015-12-12 ENCOUNTER — Other Ambulatory Visit: Payer: Self-pay | Admitting: Internal Medicine

## 2015-12-12 DIAGNOSIS — N186 End stage renal disease: Secondary | ICD-10-CM | POA: Diagnosis not present

## 2015-12-12 DIAGNOSIS — E875 Hyperkalemia: Secondary | ICD-10-CM | POA: Diagnosis not present

## 2015-12-12 DIAGNOSIS — Z23 Encounter for immunization: Secondary | ICD-10-CM | POA: Diagnosis not present

## 2015-12-12 DIAGNOSIS — N2581 Secondary hyperparathyroidism of renal origin: Secondary | ICD-10-CM | POA: Diagnosis not present

## 2015-12-12 NOTE — Discharge Summary (Signed)
Name: Carl Gladu Sr. MRN: [id number] DOB: Jun 02, 1960 56 y.o. PCP: Jamal Maes, MD  Date of Admission: 12/02/2015  6:23 AM Date of Discharge: 12/03/2015 Attending Physician: Aldine Contes, MD  Discharge Diagnosis: 1. Seizure  Discharge Medications:   Medication List    STOP taking these medications   colchicine 0.6 MG tablet     TAKE these medications   aspirin EC 81 MG tablet Take 1 tablet (81 mg total) by mouth daily.   atorvastatin 10 MG tablet Commonly known as:  LIPITOR Take 1 tablet (10 mg total) by mouth daily at 6 PM.   butalbital-acetaminophen-caffeine 50-325-40 MG tablet Commonly known as:  FIORICET, ESGIC Take 1 tablet by mouth every 12 (twelve) hours as needed for headache. No more than 2 tabs a day and no more than 5 tabs a week.   calcium acetate 667 MG capsule Commonly known as:  PHOSLO Take 2,001 mg by mouth 3 (three) times daily with meals.   cinacalcet 90 MG tablet Commonly known as:  SENSIPAR Take 1 tablet (90 mg total) by mouth 2 (two) times daily.   docusate sodium 100 MG capsule Commonly known as:  COLACE Take 1 capsule (100 mg total) by mouth 2 (two) times daily as needed for mild constipation.   folic acid-vitamin b complex-vitamin c-selenium-zinc 3 MG Tabs tablet Take 1 tablet by mouth daily.   gabapentin 100 MG capsule Commonly known as:  NEURONTIN Take 100 mg by mouth 2 (two) times daily.   ibuprofen 800 MG tablet Commonly known as:  ADVIL,MOTRIN Take 800 mg by mouth every 6 (six) hours as needed for mild pain.   lanthanum 1000 MG chewable tablet Commonly known as:  FOSRENOL Chew 1,000 mg by mouth 3 (three) times daily with meals.   levETIRAcetam 750 MG tablet Commonly known as:  KEPPRA Take 1 tablet (750 mg total) by mouth 2 (two) times daily.   ondansetron 4 MG tablet Commonly known as:  ZOFRAN Take 1 tablet (4 mg total) by mouth every 6 (six) hours.   sorbitol 70 % solution Take 15-60 mLs by mouth daily as  needed (dialysis days).       Disposition and follow-up:   [name] was discharged from [facility] in Stable condition.  At the hospital follow up visit please address:  1.  Keppra: patient was started on 750 BID per neurology recommendations. Please follow up with patient on any further seizure activity.   2.  Labs / imaging needed at time of follow-up: None  3.  Pending labs/ test needing follow-up: None  Follow-up Appointments: Follow-up Information    DUNHAM,CYNTHIA B, MD. Schedule an appointment as soon as possible for a visit today.   Specialty:  Nephrology Why:  Please follow up in 1-2 weeks Contact information: [place] [address] [phone]        Xu,Jindong, MD. Schedule an appointment as soon as possible for a visit today.   Specialty:  Neurology Why:  Please follow up in 1-2 weeks  Contact information: [address] [place] [address] [phone]           Hospital Course by problem list:  1. Seizure: Patient presented after an episode of right upper extremity shaking and numbness as well as twitching of the right side of his face. He did not lose consciousness. He did endorse weakness of his right upper extremity but denied slurred speech. Patient has a history of prior CVA and  reported compliance with his daily ASA. There was initial concern for TIA vs. CVA but CT head was negative for any acute intracranial abnormality. His weakness improved and he was not given tPA. MRI / MRA showed chronic stable changes but no acute abnormalities. Recent labs from August of this year are significant for a lipid panel with LDL 72 and A1C of 4.8. Carotid doppler this admission showed 1-39% ICA plaquing and EEG was negative for seizure activity. Upon further questioning, patient endorsed a separate incident of involuntary twitching without loss of consciousness after missing HD. 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 Patient was counseled on the importance of compliance and started on Keppra 750 BID per neurology recommendations with plans for outpatient follow up.   2. ESRD: On HD (M/W/F). Patient was on his way to dialysis the morning of admission when he developed the sudden onset R arm and fact twitching and came to the ED. Potassium was elevated 5.9 with peaked T waves on ECG. Nephrology was consulted and patient underwent dialysis the day of admission. He was discharged with plans to resume his regular M/W/F schedule.   Discharge Vitals:   BP 100/65 (BP Location: Left Arm)   Pulse 85   Temp 98 F (36.7 C) (Oral)   Resp 19   Ht 6\' 2"  (1.88 m)   Wt 188 lb 11.4 oz (85.6 kg)   SpO2 98%   BMI 24.23 kg/m   Pertinent Labs, Studies, and Procedures:   12/02/2015 CT Head: IMPRESSION: 1. No acute intracranial process identified. 2. ASPECTS is 10 3. Remote left basal ganglia lacunar infarct. 4. Prominent dural calcifications, stable.  12/02/2015 MRI / MRA Head: IMPRESSION: 1.  No acute intracranial abnormality. 2. Mild chronic small vessel disease appears stable since March. 2016 right parietal signal abnormality which was felt related to PRES has resolved without encephalomalacia. 3. 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 This appears stable since the MRV on 05/17/2015. 4. Stable since 2016 and largely unremarkable intracranial MRA. ICA siphon atherosclerosis without stenosis. Small right cavernous ICA segment pseudo-lesion versus infundibulum.  12/02/2015 EEG: Impression: This awake and asleep EEG is within normal limits.  12/03/2015 Vascular Carotid Duplex: Summary: Bilateral: intimal wall thickening CCA. Mild soft plaque origin ICA. 1-39% ICA plaquing. Vertebral artery flow is antegrade.   Discharge  Instructions: Discharge Instructions    Call MD for:  difficulty breathing, headache or visual disturbances    Complete by:  As directed    Call MD for:  persistant dizziness or light-headedness    Complete by:  As directed    Diet - low sodium heart healthy    Complete by:  As directed    Diet - low sodium heart healthy    Complete by:  As directed    Discharge instructions    Complete by:  As directed    Please continue taking all of your medicines as previously prescribed. Please call your primary care doctor and your neurologist, Dr. Erlinda Hong, and schedule follow up appointments in the next 1-2 weeks. If symptoms should recur, please return to the ER for evaluation. It was a pleasure taking care of you. Thank you!   Increase activity slowly    Complete by:  As directed    Increase activity slowly    Complete by:  As directed       Signed: Velna Ochs, MD  12/12/2015, 8:30 PM   Pager: [phone]

## 2015-12-13 DIAGNOSIS — I12 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 stage renal disease: Secondary | ICD-10-CM | POA: Diagnosis not present

## 2015-12-13 DIAGNOSIS — N186 End stage renal disease: Secondary | ICD-10-CM | POA: Diagnosis not present

## 2015-12-13 DIAGNOSIS — Z992 Dependence on renal dialysis: Secondary | ICD-10-CM | POA: Diagnosis not present

## 2015-12-14 DIAGNOSIS — N2581 Secondary hyperparathyroidism of renal origin: Secondary | ICD-10-CM | POA: Diagnosis not present

## 2015-12-14 DIAGNOSIS — D631 Anemia in chronic kidney disease: Secondary | ICD-10-CM | POA: Diagnosis not present

## 2015-12-14 DIAGNOSIS — E875 Hyperkalemia: Secondary | ICD-10-CM | POA: Diagnosis not present

## 2015-12-14 DIAGNOSIS — N186 End stage renal disease: Secondary | ICD-10-CM | POA: Diagnosis not present

## 2015-12-14 DIAGNOSIS — E1129 Type 2 diabetes mellitus with other diabetic kidney complication: Secondary | ICD-10-CM | POA: Diagnosis not present

## 2015-12-15 ENCOUNTER — Ambulatory Visit (INDEPENDENT_AMBULATORY_CARE_PROVIDER_SITE_OTHER): Payer: Medicare Other | Admitting: Physician Assistant

## 2015-12-15 ENCOUNTER — Other Ambulatory Visit: Payer: Medicare Other

## 2015-12-15 ENCOUNTER — Encounter: Payer: Self-pay | Admitting: Physician Assistant

## 2015-12-15 VITALS — BP 122/80 | HR 86 | Ht 73.0 in | Wt 196.2 lb

## 2015-12-15 DIAGNOSIS — R197 Diarrhea, unspecified: Secondary | ICD-10-CM | POA: Diagnosis not present

## 2015-12-15 DIAGNOSIS — R634 Abnormal weight loss: Secondary | ICD-10-CM | POA: Diagnosis not present

## 2015-12-15 DIAGNOSIS — Z9049 Acquired absence of other specified parts of digestive tract: Secondary | ICD-10-CM | POA: Diagnosis not present

## 2015-12-15 DIAGNOSIS — R1013 Epigastric pain: Secondary | ICD-10-CM | POA: Diagnosis not present

## 2015-12-15 DIAGNOSIS — D649 Anemia, unspecified: Secondary | ICD-10-CM

## 2015-12-15 MED ORDER — OMEPRAZOLE 20 MG PO CPDR: 20.0000 mg | DELAYED_RELEASE_CAPSULE | Freq: Every day | ORAL | 3 refills | Status: DC

## 2015-12-15 NOTE — Patient Instructions (Addendum)
Increase fiber to 25-35 Grams daily with fiber supplement  Omeprazole 20 mg daily 30-60 mins before breakfast.   Follow up with Ellouise Newer, PA in 3 weeks.   Your physician has requested that you go to the basement for lab work before leaving today.

## 2015-12-15 NOTE — Progress Notes (Addendum)
Chief Complaint: Diarrhea, Weight Loss, Abdominal Pain  HPI:  [name] is a 55 year old male with a past medical history of chronic kidney disease and failure on the renal transplant list, diverticulitis, GI bleed, on hemodialysis, hypertension and stroke, who was referred to me by Jamal Maes, MD for a complaint of diarrhea, weight loss and abdominal pain.  Patient followed with Dr. Deatra Ina previously, but Dr. Hilarie Fredrickson recently did a colonoscopy while the patient was in the hospital.    Per chart review patient's last colonoscopy was on 08/04/15 for a complaint of hematochezia and abnormal CT of the GI tract. 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 Moderate diverticulosis from the descending to sigmoid colon and no evidence of diverticular bleeding. He also had nonbleeding external and internal hemorrhoids. He had resection of this mass on 08/05/15. Patient was recommended to have repeat colonoscopy in 1 year for surveillance.   Review of chart also shows that the patient recently had a seizure and was admitted to the hospital 12/02/15. He was started on Keppra at that time. The patient tells me that his "right arm more shaking uncontrollably". It was thought he possibly had a TIA.   Today, the patient tells me that ever since his right colectomy in June he has had diarrhea. He tells me that "anything I eat goes right through me". He describes the due to this he has had a weight loss over the past month or 2, he is unable to quantify this for me. Per chart review he has lost around 15 pounds since June of this year. Patient denies seeing any bright red blood or black tarry sticky stools. The patient has tried Imodium which makes his stools "normal for a day or 2", but then he is back to normal diarrhea. He denies a high-fiber diet.    Patient also describes nausea which is "constantly there", he also has  some episodes of vomiting and does complain of reflux frequently. He has never been on medication for this.    Patient denies fever, chills, fatigue, anorexia, dysphagia, abdominal pain or symptoms that awaken him at night.  Past Medical History:  Diagnosis Date  . Anemia   . Chronic headaches   . Chronic kidney disease    Chronic Renal Failure; On Renal Transplant List  . Diverticulitis   . GI bleed   . Hemodialysis patient ([place])    M-W-F  . Hypertension   . Presence of arteriovenous fistula for hemodialysis, primary ([place])    RUE  . Refusal of blood product    NO WHOLE BLOOD PROUCTS  . Sleep apnea   . Stroke [facility])     Past Surgical History:  Procedure Laterality Date  . AV FISTULA PLACEMENT Right    right arm  . COLONOSCOPY N/A 08/04/2015   Procedure: COLONOSCOPY;  Surgeon: Jerene Bears, MD;  Location: [facility] ENDOSCOPY;  Service: Endoscopy;  Laterality: N/A;  . graft left arm Left    for dialysis x 2  . INSERTION OF DIALYSIS CATHETER     Rt chest  . LAPAROSCOPIC RIGHT COLECTOMY N/A 08/05/2015   Procedure: LAPAROSCOPIC RIGHT COLECTOMY- ASCENDING;  Surgeon: Stark Klein, MD;  Location: [facility];  Service: General;  Laterality: N/A;    Current Outpatient Prescriptions  Medication Sig Dispense Refill  . aspirin EC 81 MG tablet Take 1 tablet (81 mg total) by mouth daily. 90 tablet 3  .  atorvastatin (LIPITOR) 10 MG tablet Take 1 tablet (10 mg total) by mouth daily at 6 PM. 30 tablet 0  . butalbital-acetaminophen-caffeine (FIORICET, ESGIC) 50-325-40 MG tablet Take 1 tablet by mouth every 12 (twelve) hours as needed for headache. No more than 2 tabs a day and no more than 5 tabs a week. 20 tablet 0  . calcium acetate (PHOSLO) 667 MG capsule Take 2,001 mg by mouth 3 (three) times daily with meals.     . cinacalcet (SENSIPAR) 90 MG tablet Take 1 tablet (90 mg total) by mouth 2 (two) times daily. 120 tablet 0  . docusate sodium (COLACE) 100 MG capsule Take 1 capsule (100 mg total) by mouth 2  (two) times daily as needed for mild constipation. 10 capsule 0  . folic acid-vitamin b complex-vitamin c-selenium-zinc (DIALYVITE) 3 MG TABS tablet Take 1 tablet by mouth daily.    Marland Kitchen gabapentin (NEURONTIN) 100 MG capsule Take 100 mg by mouth 2 (two) times daily.     Marland Kitchen ibuprofen (ADVIL,MOTRIN) 800 MG tablet Take 800 mg by mouth every 6 (six) hours as needed for mild pain.     Marland Kitchen lanthanum (FOSRENOL) 1000 MG chewable tablet Chew 1,000 mg by mouth 3 (three) times daily with meals.    . levETIRAcetam (KEPPRA) 750 MG tablet Take 1 tablet (750 mg total) by mouth 2 (two) times daily. 60 tablet 3  . ondansetron (ZOFRAN) 4 MG tablet Take 1 tablet (4 mg total) by mouth every 6 (six) hours. 12 tablet 0  . sorbitol 70 % solution Take 15-60 mLs by mouth daily as needed (dialysis days).      No current facility-administered medications for this visit.     Allergies as of 12/15/2015 - Review Complete 12/15/2015  Allergen Reaction Noted  . Infed [iron dextran] Other (See Comments) 06/19/2010  . Oxycodone Nausea Only 06/19/2010  . Vicodin [hydrocodone-acetaminophen] Other (See Comments) 06/19/2010  . Diclofenac Other (See Comments) 07/01/2014  . Whole blood Other (See Comments) 07/01/2014    Family History  Problem Relation Age of Onset  . Diabetes Father   . Stroke Father   . Hypertension Father   . Uterine cancer Mother   . Lupus Sister   . Stroke Sister   . Anuerysm Brother     brain  . Hypertension Sister     Social History   Social History  . Marital status: Single    Spouse name: N/A  . Number of children: 2  . Years of education: N/A   Occupational History  . retired Sports coach Employed   Social History Main Topics  . Smoking status: Former Smoker    Quit date: 06/19/1990  . Smokeless tobacco: Never Used  . Alcohol use 0.6 oz/week    1 Cans of beer per week     Comment: occasional  . Drug use: No  . Sexual activity: Not on file   Other Topics Concern  . Not on file   Social  History Narrative   Lives alone at home   12 years of education    2 children    Not married        Review of Systems:     Constitutional: Positive for weight loss of 15 pounds since his right colectomy in June of this year No fever, chills, weakness or fatigue HEENT: Eyes: No change in vision               Ears, Nose, Throat:  No change in hearing or  congestion Skin: No rash or itching Cardiovascular: No chest pain, chest pressure or palpitations   Respiratory: No SOB or cough Gastrointestinal: See HPI and otherwise negative Genitourinary: No dysuria or change in urinary frequency Neurological: Positive for headaches and recent seizure No dizziness or syncope Musculoskeletal: No new muscle or joint pain Hematologic: No bleeding or bruising Psychiatric: No history of depression or anxiety   Physical Exam:  Vital signs: BP 122/80   Pulse 86   Ht 6\' 1"  (1.854 m)   Wt 196 lb 3.2 oz (89 kg)   SpO2 99%   BMI 25.89 kg/m   General:   Very pleasant African-American male appears to be in NAD, Well developed, Well nourished, alert and cooperative Head:  Normocephalic and atraumatic. Eyes:   PEERL, EOMI. No icterus. Conjunctiva pink. Ears:  Normal auditory acuity. Neck:  Supple Throat: Oral cavity and pharynx without inflammation, swelling or lesion. Teeth in good condition. Lungs: Respirations even and unlabored. Lungs clear to auscultation bilaterally.   No wheezes, crackles, or rhonchi.  Heart: Normal S1, S2. No MRG. Regular rate and rhythm. No peripheral edema, cyanosis or pallor.  Abdomen:  Soft, nondistended, nontender. No rebound or guarding. Normal bowel sounds. No appreciable masses or hepatomegaly. Rectal:  Not performed.  Msk:  Symmetrical without gross deformities.  Extremities:  Without edema, no deformity or joint abnormality.  Neurologic:  Alert and  oriented x4;  grossly normal neurologically.  Skin:   Dry and intact without significant lesions or  rashes. Psychiatric: Oriented to person, place and time. Demonstrates good judgement and reason without abnormal affect or behaviors.  RELEVANT LABS AND IMAGING: CBC    Component Value Date/Time   WBC 4.4 12/03/2015 0733   RBC 3.92 (L) 12/03/2015 0733   HGB 10.2 (L) 12/03/2015 0733   HCT 32.3 (L) 12/03/2015 0733   PLT 235 12/03/2015 0733   MCV 82.4 12/03/2015 0733   MCH 26.0 12/03/2015 0733   MCHC 31.6 12/03/2015 0733   RDW 18.1 (H) 12/03/2015 0733   LYMPHSABS 1.4 12/02/2015 0626   MONOABS 0.3 12/02/2015 0626   EOSABS 0.4 12/02/2015 0626   BASOSABS 0.1 12/02/2015 0626    CMP     Component Value Date/Time   NA 136 12/03/2015 0733   K 5.0 12/03/2015 0733   CL 93 (L) 12/03/2015 0733   CO2 31 12/03/2015 0733   GLUCOSE 81 12/03/2015 0733   BUN 34 (H) 12/03/2015 0733   CREATININE 7.62 (H) 12/03/2015 0733   CREATININE 11.39 (HH) 12/09/2012 1221   CALCIUM 9.7 12/03/2015 0733   CALCIUM 8.0 (L) 08/02/2006 1133   PROT 7.6 12/02/2015 0626   ALBUMIN 2.9 (L) 12/02/2015 0626   AST 14 (L) 12/02/2015 0626   ALT 10 (L) 12/02/2015 0626   ALKPHOS 65 12/02/2015 0626   BILITOT 0.3 12/02/2015 0626   GFRNONAA 7 (L) 12/03/2015 0733   GFRAA 8 (L) 12/03/2015 0733    Assessment: 1. 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 Dyspepsia: Patient reports almost constant nausea and frequent episodes of vomiting along with reflux symptoms; question relation, consider GERD versus gastritis versus  esophagitis versus other 4. Weight loss: Weight loss of 15 pounds since patient's right colectomy in June of this  year, patient blames this on constant diarrhea 5. Anemia: Review of chart shows patient's hemoglobin had a max of 12.3 on 10/01/15, since that time this has slowly decreased, most recent results are from 12/03/15 revealing a hemoglobin of 10.2. Patient tells me this was redrawn during his dialysis treatment yesterday and results are pending. May need further investigation if hemoglobin continues to decrease.  Plan: 1. Recommend the patient increase fiber in his diet to 25-35 g per day with use of fiber supplement such as Metamucil, Citrucel or Benefiber. Patient verbalized understanding. 2. Ordered stool studies to include a GI pathogen panel, C. difficile and ova and parasites. 3. Prescribed Omeprazole 20 mg daily, 30-60 minutes before eating for dyspepsia and GERD. 4. In the future could consider daily dosing of Imodium to control diarrhea if stool studies are negative. 5. Patient to return to clinic in 3-4 weeks with me or Dr. Bethena Midget, PA-C [facility] 12/15/2015, 11:12 AM  Cc: Jamal Maes, MD   Addendum: Reviewed and agree with initial management. Repeat colon recommended summer 2018 Jerene Bears, MD

## 2015-12-16 DIAGNOSIS — N2581 Secondary hyperparathyroidism of renal origin: Secondary | ICD-10-CM | POA: Diagnosis not present

## 2015-12-16 DIAGNOSIS — N186 End stage renal disease: Secondary | ICD-10-CM | POA: Diagnosis not present

## 2015-12-16 DIAGNOSIS — D631 Anemia in chronic kidney disease: Secondary | ICD-10-CM | POA: Diagnosis not present

## 2015-12-16 DIAGNOSIS — E875 Hyperkalemia: Secondary | ICD-10-CM | POA: Diagnosis not present

## 2015-12-16 DIAGNOSIS — E1129 Type 2 diabetes mellitus with other diabetic kidney complication: Secondary | ICD-10-CM | POA: Diagnosis not present

## 2015-12-19 DIAGNOSIS — D631 Anemia in chronic kidney disease: Secondary | ICD-10-CM | POA: Diagnosis not present

## 2015-12-19 DIAGNOSIS — E875 Hyperkalemia: Secondary | ICD-10-CM | POA: Diagnosis not present

## 2015-12-19 DIAGNOSIS — E1129 Type 2 diabetes mellitus with other diabetic kidney complication: Secondary | ICD-10-CM | POA: Diagnosis not present

## 2015-12-19 DIAGNOSIS — N186 End stage renal disease: Secondary | ICD-10-CM | POA: Diagnosis not present

## 2015-12-19 DIAGNOSIS — N2581 Secondary hyperparathyroidism of renal origin: Secondary | ICD-10-CM | POA: Diagnosis not present

## 2015-12-21 DIAGNOSIS — N186 End stage renal disease: Secondary | ICD-10-CM | POA: Diagnosis not present

## 2015-12-21 DIAGNOSIS — N2581 Secondary hyperparathyroidism of renal origin: Secondary | ICD-10-CM | POA: Diagnosis not present

## 2015-12-21 DIAGNOSIS — E1129 Type 2 diabetes mellitus with other diabetic kidney complication: Secondary | ICD-10-CM | POA: Diagnosis not present

## 2015-12-21 DIAGNOSIS — D631 Anemia in chronic kidney disease: Secondary | ICD-10-CM | POA: Diagnosis not present

## 2015-12-21 DIAGNOSIS — E875 Hyperkalemia: Secondary | ICD-10-CM | POA: Diagnosis not present

## 2015-12-23 DIAGNOSIS — N2581 Secondary hyperparathyroidism of renal origin: Secondary | ICD-10-CM | POA: Diagnosis not present

## 2015-12-23 DIAGNOSIS — E875 Hyperkalemia: Secondary | ICD-10-CM | POA: Diagnosis not present

## 2015-12-23 DIAGNOSIS — N186 End stage renal disease: Secondary | ICD-10-CM | POA: Diagnosis not present

## 2015-12-23 DIAGNOSIS — D631 Anemia in chronic kidney disease: Secondary | ICD-10-CM | POA: Diagnosis not present

## 2015-12-23 DIAGNOSIS — E1129 Type 2 diabetes mellitus with other diabetic kidney complication: Secondary | ICD-10-CM | POA: Diagnosis not present

## 2015-12-26 DIAGNOSIS — E875 Hyperkalemia: Secondary | ICD-10-CM | POA: Diagnosis not present

## 2015-12-26 DIAGNOSIS — N2581 Secondary hyperparathyroidism of renal origin: Secondary | ICD-10-CM | POA: Diagnosis not present

## 2015-12-26 DIAGNOSIS — E1129 Type 2 diabetes mellitus with other diabetic kidney complication: Secondary | ICD-10-CM | POA: Diagnosis not present

## 2015-12-26 DIAGNOSIS — D631 Anemia in chronic kidney disease: Secondary | ICD-10-CM | POA: Diagnosis not present

## 2015-12-26 DIAGNOSIS — N186 End stage renal disease: Secondary | ICD-10-CM | POA: Diagnosis not present

## 2015-12-28 DIAGNOSIS — E875 Hyperkalemia: Secondary | ICD-10-CM | POA: Diagnosis not present

## 2015-12-28 DIAGNOSIS — N186 End stage renal disease: Secondary | ICD-10-CM | POA: Diagnosis not present

## 2015-12-28 DIAGNOSIS — N2581 Secondary hyperparathyroidism of renal origin: Secondary | ICD-10-CM | POA: Diagnosis not present

## 2015-12-28 DIAGNOSIS — D631 Anemia in chronic kidney disease: Secondary | ICD-10-CM | POA: Diagnosis not present

## 2015-12-28 DIAGNOSIS — E1129 Type 2 diabetes mellitus with other diabetic kidney complication: Secondary | ICD-10-CM | POA: Diagnosis not present

## 2015-12-30 DIAGNOSIS — E875 Hyperkalemia: Secondary | ICD-10-CM | POA: Diagnosis not present

## 2015-12-30 DIAGNOSIS — N2581 Secondary hyperparathyroidism of renal origin: Secondary | ICD-10-CM | POA: Diagnosis not present

## 2015-12-30 DIAGNOSIS — N186 End stage renal disease: Secondary | ICD-10-CM | POA: Diagnosis not present

## 2015-12-30 DIAGNOSIS — E1129 Type 2 diabetes mellitus with other diabetic kidney complication: Secondary | ICD-10-CM | POA: Diagnosis not present

## 2015-12-30 DIAGNOSIS — D631 Anemia in chronic kidney disease: Secondary | ICD-10-CM | POA: Diagnosis not present

## 2016-01-01 DIAGNOSIS — E875 Hyperkalemia: Secondary | ICD-10-CM | POA: Diagnosis not present

## 2016-01-01 DIAGNOSIS — N186 End stage renal disease: Secondary | ICD-10-CM | POA: Diagnosis not present

## 2016-01-01 DIAGNOSIS — E1129 Type 2 diabetes mellitus with other diabetic kidney complication: Secondary | ICD-10-CM | POA: Diagnosis not present

## 2016-01-01 DIAGNOSIS — D631 Anemia in chronic kidney disease: Secondary | ICD-10-CM | POA: Diagnosis not present

## 2016-01-01 DIAGNOSIS — N2581 Secondary hyperparathyroidism of renal origin: Secondary | ICD-10-CM | POA: Diagnosis not present

## 2016-01-03 DIAGNOSIS — D631 Anemia in chronic kidney disease: Secondary | ICD-10-CM | POA: Diagnosis not present

## 2016-01-03 DIAGNOSIS — N2581 Secondary hyperparathyroidism of renal origin: Secondary | ICD-10-CM | POA: Diagnosis not present

## 2016-01-03 DIAGNOSIS — E1129 Type 2 diabetes mellitus with other diabetic kidney complication: Secondary | ICD-10-CM | POA: Diagnosis not present

## 2016-01-03 DIAGNOSIS — E875 Hyperkalemia: Secondary | ICD-10-CM | POA: Diagnosis not present

## 2016-01-03 DIAGNOSIS — N186 End stage renal disease: Secondary | ICD-10-CM | POA: Diagnosis not present

## 2016-01-06 DIAGNOSIS — E875 Hyperkalemia: Secondary | ICD-10-CM | POA: Diagnosis not present

## 2016-01-06 DIAGNOSIS — D631 Anemia in chronic kidney disease: Secondary | ICD-10-CM | POA: Diagnosis not present

## 2016-01-06 DIAGNOSIS — N186 End stage renal disease: Secondary | ICD-10-CM | POA: Diagnosis not present

## 2016-01-06 DIAGNOSIS — E1129 Type 2 diabetes mellitus with other diabetic kidney complication: Secondary | ICD-10-CM | POA: Diagnosis not present

## 2016-01-06 DIAGNOSIS — N2581 Secondary hyperparathyroidism of renal origin: Secondary | ICD-10-CM | POA: Diagnosis not present

## 2016-01-09 DIAGNOSIS — D631 Anemia in chronic kidney disease: Secondary | ICD-10-CM | POA: Diagnosis not present

## 2016-01-09 DIAGNOSIS — N186 End stage renal disease: Secondary | ICD-10-CM | POA: Diagnosis not present

## 2016-01-09 DIAGNOSIS — E1129 Type 2 diabetes mellitus with other diabetic kidney complication: Secondary | ICD-10-CM | POA: Diagnosis not present

## 2016-01-09 DIAGNOSIS — E875 Hyperkalemia: Secondary | ICD-10-CM | POA: Diagnosis not present

## 2016-01-09 DIAGNOSIS — N2581 Secondary hyperparathyroidism of renal origin: Secondary | ICD-10-CM | POA: Diagnosis not present

## 2016-01-10 ENCOUNTER — Encounter: Payer: Self-pay | Admitting: Physician Assistant

## 2016-01-10 ENCOUNTER — Ambulatory Visit (INDEPENDENT_AMBULATORY_CARE_PROVIDER_SITE_OTHER): Payer: Medicare Other | Admitting: Physician Assistant

## 2016-01-10 VITALS — BP 114/62 | HR 84 | Ht 72.44 in | Wt 200.0 lb

## 2016-01-10 DIAGNOSIS — R197 Diarrhea, unspecified: Secondary | ICD-10-CM

## 2016-01-10 DIAGNOSIS — Z9049 Acquired absence of other specified parts of digestive tract: Secondary | ICD-10-CM | POA: Diagnosis not present

## 2016-01-10 DIAGNOSIS — R1013 Epigastric pain: Secondary | ICD-10-CM | POA: Diagnosis not present

## 2016-01-10 DIAGNOSIS — I63312 Cerebral infarction due to thrombosis of left middle cerebral artery: Secondary | ICD-10-CM | POA: Diagnosis not present

## 2016-01-10 NOTE — Progress Notes (Addendum)
Chief Complaint: Diarrhea, Abdominal Pain  HPI:  [name] is a 55 year old African-American male with past medical history of chronic kidney disease and failure on the renal transplant list, diverticulitis, GI bleed, on hemodialysis, hypertension and stroke who returns to clinic today for follow-up of his abdominal pain and diarrhea. He is a patient of Dr. Vena Rua.   Please recall patient was last seen in clinic on 12/15/15 and at that time described that ever since his right colectomy in June he had had diarrhea. He also described nausea which was constant and frequent reflux. At that visit he was told to increase fiber in his diet with a fiber supplement and start omeprazole 20 mg daily. Stool studies were ordered but never received. It was noted patient was due for repeat colonoscopy in summer of 2018.   Today, the patient presented to clinic and tells me that all of his symptoms are better. He is currently using one to 2 capsules of Metamucil fiber and his stools have bulked up. He takes these on a daily basis. He tells me that if he does miss a dose then he will have some breakthrough diarrhea, but as long as he maintains high fiber he does well. He tells me he never brought in the stool samples because his "dog ate the containers".   Patient also tells me he is using omeprazole 20 mg once daily and this is controlling his reflux and nausea symptoms well.   Patient denies fever, chills, blood in his stool, melena, decrease in appetite, further weight loss, nausea, vomiting, heartburn, reflux or abdominal pain.    Past Medical History:  Diagnosis Date  . Anemia   . Chronic headaches   . Chronic kidney disease    Chronic Renal Failure; On Renal Transplant List  . Diverticulitis   . GI bleed   . Hemodialysis patient ([place])    M-W-F  . Hypertension   . Presence of arteriovenous fistula for hemodialysis, primary ([place])    RUE  . Refusal of blood product    NO WHOLE BLOOD PROUCTS  . Sleep  apnea   . Stroke [facility])     Past Surgical History:  Procedure Laterality Date  . AV FISTULA PLACEMENT Right    right arm  . COLONOSCOPY N/A 08/04/2015   Procedure: COLONOSCOPY;  Surgeon: Jerene Bears, MD;  Location: [facility] ENDOSCOPY;  Service: Endoscopy;  Laterality: N/A;  . graft left arm Left    for dialysis x 2  . INSERTION OF DIALYSIS CATHETER     Rt chest  . LAPAROSCOPIC RIGHT COLECTOMY N/A 08/05/2015   Procedure: LAPAROSCOPIC RIGHT COLECTOMY- ASCENDING;  Surgeon: Stark Klein, MD;  Location: [place];  Service: General;  Laterality: N/A;    Current Outpatient Prescriptions  Medication Sig Dispense Refill  . aspirin EC 81 MG tablet Take 1 tablet (81 mg total) by mouth daily. 90 tablet 3  . atorvastatin (LIPITOR) 10 MG tablet Take 1 tablet (10 mg total) by mouth daily at 6 PM. 30 tablet 0  . butalbital-acetaminophen-caffeine (FIORICET, ESGIC) 50-325-40 MG tablet Take 1 tablet by mouth every 12 (twelve) hours as needed for headache. No more than 2 tabs a day and no more than 5 tabs a week. 20 tablet 0  . calcium acetate (PHOSLO) 667 MG capsule Take 2,001 mg by mouth 3 (three) times daily with meals.     . cinacalcet (SENSIPAR) 90 MG tablet Take 1 tablet (90 mg total) by mouth 2 (two) times daily. [place]  tablet 0  . docusate sodium (COLACE) 100 MG capsule Take 1 capsule (100 mg total) by mouth 2 (two) times daily as needed for mild constipation. 10 capsule 0  . folic acid-vitamin b complex-vitamin c-selenium-zinc (DIALYVITE) 3 MG TABS tablet Take 1 tablet by mouth daily.    Marland Kitchen gabapentin (NEURONTIN) 100 MG capsule Take 100 mg by mouth 2 (two) times daily.     Marland Kitchen ibuprofen (ADVIL,MOTRIN) 800 MG tablet Take 800 mg by mouth every 6 (six) hours as needed for mild pain.     Marland Kitchen lanthanum (FOSRENOL) 1000 MG chewable tablet Chew 1,000 mg by mouth 3 (three) times daily with meals.    . levETIRAcetam (KEPPRA) 750 MG tablet Take 1 tablet (750 mg total) by mouth 2 (two) times daily. 60 tablet 3  . omeprazole  (PRILOSEC) 20 MG capsule Take 1 capsule (20 mg total) by mouth daily. 30-60 mins before breakfast. 30 capsule 3  . ondansetron (ZOFRAN) 4 MG tablet Take 1 tablet (4 mg total) by mouth every 6 (six) hours. 12 tablet 0  . psyllium (REGULOID) 0.52 g capsule Take 0.52 g by mouth daily. 2 caps with meals    . sorbitol 70 % solution Take 15-60 mLs by mouth daily as needed (dialysis days).      No current facility-administered medications for this visit.     Allergies as of 01/10/2016 - Review Complete 01/10/2016  Allergen Reaction Noted  . Infed [iron dextran] Other (See Comments) 06/19/2010  . Oxycodone Nausea Only 06/19/2010  . Vicodin [hydrocodone-acetaminophen] Other (See Comments) 06/19/2010  . Diclofenac Other (See Comments) 07/01/2014  . Whole blood Other (See Comments) 07/01/2014    Family History  Problem Relation Age of Onset  . Diabetes Father   . Stroke Father   . Hypertension Father   . Uterine cancer Mother   . Lupus Sister   . Stroke Sister   . Anuerysm Brother     brain  . Hypertension Sister     Social History   Social History  . Marital status: Single    Spouse name: N/A  . Number of children: 2  . Years of education: N/A   Occupational History  . retired Sports coach Employed   Social History Main Topics  . Smoking status: Former Smoker    Quit date: 06/19/1990  . Smokeless tobacco: Never Used  . Alcohol use 0.6 oz/week    1 Cans of beer per week     Comment: occasional  . Drug use: No  . Sexual activity: Not on file   Other Topics Concern  . Not on file   Social History Narrative   Lives alone at home   12 years of education    2 children    Not married        Review of Systems:     Constitutional: No weight loss, fever, chills, weakness or fatigue Respiratory: No SOB  Gastrointestinal: See HPI and otherwise negative   Physical Exam:  Vital signs: BP 114/62   Pulse 84   Ht 6' 0.44" (1.84 m) Comment: w/o shoes  Wt 200 lb (90.7 kg)   BMI  26.80 kg/m   Constitutional: African American male appears to be in NAD, Well developed, Well nourished, alert and cooperative Respiratory: Respirations even and unlabored. Lungs clear to auscultation bilaterally.   No wheezes, crackles, or rhonchi.  Cardiovascular: Normal S1, S2. No MRG. Regular rate and rhythm. No peripheral edema, cyanosis or pallor.  Gastrointestinal:  Soft, nondistended, nontender.  No rebound or guarding. Normal bowel sounds. No appreciable masses or hepatomegaly. Psychiatric: Demonstrates good judgement and reason without abnormal affect or behaviors.  No Recent Labs.  Assessment: 1. Diarrhea: Resolved with fiber supplement 2. Dyspepsia: Resolved with omeprazole 20 mg daily  Plan: 1. Continue daily fiber supplement. 2. Continue Omeprazole 20 mg daily 3. Please return to clinic in June 2018 for a screening colonoscopy with Dr. Hilarie Fredrickson or sooner if necessary.  Ellouise Newer, PA-C [facility] 01/10/2016, 3:30 PM  Cc: Jamal Maes, MD   Addendum: Reviewed and agree with management. Jerene Bears, MD

## 2016-01-10 NOTE — Patient Instructions (Signed)
Please continue Omeprazole daily.  Continue to take the metamucil daily.  Follow up as needed.  Thank you for choosing [facility]

## 2016-01-11 DIAGNOSIS — E875 Hyperkalemia: Secondary | ICD-10-CM | POA: Diagnosis not present

## 2016-01-11 DIAGNOSIS — E1129 Type 2 diabetes mellitus with other diabetic kidney complication: Secondary | ICD-10-CM | POA: Diagnosis not present

## 2016-01-11 DIAGNOSIS — N186 End stage renal disease: Secondary | ICD-10-CM | POA: Diagnosis not present

## 2016-01-11 DIAGNOSIS — D631 Anemia in chronic kidney disease: Secondary | ICD-10-CM | POA: Diagnosis not present

## 2016-01-11 DIAGNOSIS — N2581 Secondary hyperparathyroidism of renal origin: Secondary | ICD-10-CM | POA: Diagnosis not present

## 2016-01-12 DIAGNOSIS — I12 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 stage renal disease: Secondary | ICD-10-CM | POA: Diagnosis not present

## 2016-01-12 DIAGNOSIS — Z992 Dependence on renal dialysis: Secondary | ICD-10-CM | POA: Diagnosis not present

## 2016-01-12 DIAGNOSIS — N186 End stage renal disease: Secondary | ICD-10-CM | POA: Diagnosis not present

## 2016-01-13 DIAGNOSIS — E1129 Type 2 diabetes mellitus with other diabetic kidney complication: Secondary | ICD-10-CM | POA: Diagnosis not present

## 2016-01-13 DIAGNOSIS — N186 End stage renal disease: Secondary | ICD-10-CM | POA: Diagnosis not present

## 2016-01-13 DIAGNOSIS — N2581 Secondary hyperparathyroidism of renal origin: Secondary | ICD-10-CM | POA: Diagnosis not present

## 2016-01-13 DIAGNOSIS — D631 Anemia in chronic kidney disease: Secondary | ICD-10-CM | POA: Diagnosis not present

## 2016-01-16 DIAGNOSIS — N2581 Secondary hyperparathyroidism of renal origin: Secondary | ICD-10-CM | POA: Diagnosis not present

## 2016-01-16 DIAGNOSIS — D631 Anemia in chronic kidney disease: Secondary | ICD-10-CM | POA: Diagnosis not present

## 2016-01-16 DIAGNOSIS — N186 End stage renal disease: Secondary | ICD-10-CM | POA: Diagnosis not present

## 2016-01-16 DIAGNOSIS — E1129 Type 2 diabetes mellitus with other diabetic kidney complication: Secondary | ICD-10-CM | POA: Diagnosis not present

## 2016-01-17 ENCOUNTER — Other Ambulatory Visit: Payer: Self-pay

## 2016-01-17 ENCOUNTER — Telehealth: Payer: Self-pay | Admitting: Neurology

## 2016-01-17 DIAGNOSIS — G4489 Other headache syndrome: Secondary | ICD-10-CM

## 2016-01-17 MED ORDER — BUTALBITAL-APAP-CAFFEINE 50-325-40 MG PO TABS: 1.0000 | ORAL_TABLET | Freq: Two times a day (BID) | ORAL | 0 refills | Status: DC | PRN

## 2016-01-17 NOTE — Telephone Encounter (Signed)
Medication fax to [facility].

## 2016-01-17 NOTE — Telephone Encounter (Signed)
Yes, please. Because fioricet needs paper prescription and signature. Thanks.   Rosalin Hawking, MD PhD Stroke Neurology 01/17/2016 4:55 PM

## 2016-01-17 NOTE — Telephone Encounter (Signed)
Dr. Erlinda Hong, pt wants floricet, can Dr. Leonie Man filled it for you. See note thanks

## 2016-01-17 NOTE — Telephone Encounter (Signed)
Patient would like a refill of Fioricet. He uses Paediatric nurse on [address]. Best call back is [phone]

## 2016-01-18 ENCOUNTER — Encounter (HOSPITAL_COMMUNITY): Payer: Self-pay | Admitting: Emergency Medicine

## 2016-01-18 ENCOUNTER — Ambulatory Visit (HOSPITAL_COMMUNITY)
Admission: EM | Admit: 2016-01-18 | Discharge: 2016-01-18 | Disposition: A | Discharge status: Home / Self Care | Payer: Medicare Other | Attending: Family Medicine | Admitting: Family Medicine

## 2016-01-18 DIAGNOSIS — H6123 Impacted cerumen, bilateral: Secondary | ICD-10-CM

## 2016-01-18 DIAGNOSIS — E1129 Type 2 diabetes mellitus with other diabetic kidney complication: Secondary | ICD-10-CM | POA: Diagnosis not present

## 2016-01-18 DIAGNOSIS — D631 Anemia in chronic kidney disease: Secondary | ICD-10-CM | POA: Diagnosis not present

## 2016-01-18 DIAGNOSIS — N2581 Secondary hyperparathyroidism of renal origin: Secondary | ICD-10-CM | POA: Diagnosis not present

## 2016-01-18 DIAGNOSIS — H6120 Impacted cerumen, unspecified ear: Secondary | ICD-10-CM

## 2016-01-18 DIAGNOSIS — N186 End stage renal disease: Secondary | ICD-10-CM | POA: Diagnosis not present

## 2016-01-18 NOTE — ED Provider Notes (Signed)
[facility]    CSN: [id number] Arrival date & time: 01/18/16  1206     History   Chief Complaint Chief Complaint  Patient presents with  . Otalgia    HPI Carl Proehl Sr. is a 55 y.o. male.   This is a 55 year old man who presents with 1 week of hearing loss and left ear pain. He has had problems with wax buildup in the past and believes that's what's going on.  He's on dialysis and was dialyzed this morning from 6:00 to 11.      Past Medical History:  Diagnosis Date  . Anemia   . Chronic headaches   . Chronic kidney disease    Chronic Renal Failure; On Renal Transplant List  . Diverticulitis   . GI bleed   . Hemodialysis patient ([place])    M-W-F  . Hypertension   . Presence of arteriovenous fistula for hemodialysis, primary ([place])    RUE  . Refusal of blood product    NO WHOLE BLOOD PROUCTS  . Sleep apnea   . Stroke [facility])     Patient Active Problem List   Diagnosis Date Noted  . TIA (transient ischemic attack)   . Seizure-like activity ([place])   . Essential hypertension   . Seizures ([place])   . Acute pericarditis   . Atypical chest pain 10/01/2015  . Abnormal EKG 10/01/2015  . Chest pain 10/01/2015  . 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 GI bleed 08/01/2015  . Acute blood loss anemia   . Acute diverticulitis   . Lung nodule   . Diverticulitis of cecum 07/21/2015  . Diverticulitis of large intestine without perforation or abscess without bleeding   . Right lower quadrant pain   . Headache 05/05/2015  . 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 Hypertension secondary to other renal disorders 08/26/2014  . Diverticulosis 07/13/2014  . Secondary hyperparathyroidism ([place]) 07/13/2014  . Lower GI bleed 07/12/2014  . Hematochezia   . Acute posthemorrhagic anemia   . End stage renal disease ([place])   . Refusal of blood transfusions as patient is Jehovah's Witness   . Parotid mass   . HLD (hyperlipidemia)   . PRES  (posterior reversible encephalopathy syndrome)   . Stroke ([place]) 05/23/2014  . CVA (cerebral infarction) 05/23/2014  . Anemia of renal disease 05/23/2014  . HTN (hypertension) 12/09/2012  . GERD (gastroesophageal reflux disease) 12/09/2012  . ESRD on dialysis ([place]) 12/09/2012  . Gastrointestinal bleeding 11/12/2012    Past Surgical History:  Procedure Laterality Date  . AV FISTULA PLACEMENT Right    right arm  . COLONOSCOPY N/A 08/04/2015   Procedure: COLONOSCOPY;  Surgeon: Jerene Bears, MD;  Location: [facility] ENDOSCOPY;  Service: Endoscopy;  Laterality: N/A;  . graft left arm Left    for dialysis x 2  . INSERTION OF DIALYSIS CATHETER     Rt chest  . LAPAROSCOPIC RIGHT COLECTOMY N/A 08/05/2015   Procedure: LAPAROSCOPIC RIGHT COLECTOMY- ASCENDING;  Surgeon: Stark Klein, MD;  Location: [place];  Service: General;  Laterality: N/A;       Home Medications    Prior to Admission medications   Medication Sig Start Date End Date Taking? Authorizing Provider  aspirin EC 81 MG tablet Take 1 tablet (81 mg total) by mouth daily. 08/26/14   Rosalin Hawking, MD  atorvastatin (LIPITOR) 10 MG tablet Take 1 tablet (10 mg total) by mouth daily at 6 PM. 05/25/14   Thurnell Lose, MD  butalbital-acetaminophen-caffeine (FIORICET, ESGIC) 50-325-40 MG tablet Take 1 tablet by mouth every 12 (twelve) hours as needed for headache. No more than 2 tabs a day and no more than 5 tabs a week. 01/17/16   Garvin Fila, MD  calcium acetate (PHOSLO) 667 MG capsule Take 2,001 mg by mouth 3 (three) times daily with meals.     Historical Provider, MD  cinacalcet (SENSIPAR) 90 MG tablet Take 1 tablet (90 mg total) by mouth 2 (two) times daily. 08/10/15   Lavina Hamman, MD  docusate sodium (COLACE) 100 MG capsule Take 1 capsule (100 mg total) by mouth 2 (two) times daily as needed for mild constipation. 08/10/15   Lavina Hamman, MD  folic acid-vitamin b complex-vitamin c-selenium-zinc (DIALYVITE) 3 MG TABS tablet Take 1 tablet by  mouth daily.    Historical Provider, MD  gabapentin (NEURONTIN) 100 MG capsule Take 100 mg by mouth 2 (two) times daily.  09/29/14   Historical Provider, MD  ibuprofen (ADVIL,MOTRIN) 800 MG tablet Take 800 mg by mouth every 6 (six) hours as needed for mild pain.  10/14/15   Historical Provider, MD  lanthanum (FOSRENOL) 1000 MG chewable tablet Chew 1,000 mg by mouth 3 (three) times daily with meals.    Historical Provider, MD  levETIRAcetam (KEPPRA) 750 MG tablet Take 1 tablet (750 mg total) by mouth 2 (two) times daily. 12/03/15   Maryellen Pile, MD  omeprazole (PRILOSEC) 20 MG capsule Take 1 capsule (20 mg total) by mouth daily. 30-60 mins before breakfast. 12/15/15   Levin Erp, PA  ondansetron (ZOFRAN) 4 MG tablet Take 1 tablet (4 mg total) by mouth every 6 (six) hours. 09/10/15   Vira Blanco, MD  psyllium (REGULOID) 0.52 g capsule Take 0.52 g by mouth daily. 2 caps with meals    Historical Provider, MD  sorbitol 70 % solution Take 15-60 mLs by mouth daily as needed (dialysis days).     Historical Provider, MD    Family History Family History  Problem Relation Age of Onset  . Diabetes Father   . Stroke Father   . Hypertension Father   . Uterine cancer Mother   . Lupus Sister   . Stroke Sister   . Anuerysm Brother     brain  . Hypertension Sister     Social History Social History  Substance Use Topics  . Smoking status: Former Smoker    Quit date: 06/19/1990  . Smokeless tobacco: Never Used  . Alcohol use 0.6 oz/week    1 Cans of beer per week     Comment: occasional     Allergies   Infed [iron dextran]; Oxycodone; Vicodin [hydrocodone-acetaminophen]; Diclofenac; and Whole blood   Review of Systems Review of Systems  Constitutional: Negative.   HENT: Positive for ear pain and hearing loss.   Eyes: Negative.   Respiratory: Negative.   Cardiovascular: Negative.      Physical Exam Triage Vital Signs ED Triage Vitals  Enc Vitals Group     BP 01/18/16 1233  109/68     Pulse Rate 01/18/16 1233 106     Resp 01/18/16 1233 18     Temp 01/18/16 1233 98.3 F (36.8 C)     Temp Source 01/18/16 1233 Oral     SpO2 01/18/16 1233 97 %     Weight --      Height --      Head Circumference --      Peak Flow --  Pain Score 01/18/16 1236 8     Pain Loc --      Pain Edu? --      Excl. in [place]? --    No data found.   Updated Vital Signs BP 109/68 (BP Location: Left Arm)   Pulse 106   Temp 98.3 F (36.8 C) (Oral)   Resp 18   SpO2 97%    Physical Exam  Constitutional: He appears well-developed and well-nourished.  HENT:  Head: Normocephalic.  Right Ear: External ear normal.  Left Ear: External ear normal.  Bilateral cerumen impaction  Eyes: Conjunctivae and EOM are normal.  Neck: Normal range of motion. Neck supple.  Cardiovascular: Normal rate, regular rhythm and normal heart sounds.   Pulmonary/Chest: Effort normal and breath sounds normal.  Musculoskeletal: Normal range of motion.  Patient has shunt site in his right forearm with palpable thrill.  Neurological: He is alert. No cranial nerve deficit.  Skin: Skin is warm.  Nursing note and vitals reviewed.    UC Treatments / Results  Labs (all labs ordered are listed, but only abnormal results are displayed) Labs Reviewed - No data to display  EKG  EKG Interpretation None       Radiology No results found.  Procedures Procedures (including critical care time)  Medications Ordered in UC Medications - No data to display   Initial Impression / Assessment and Plan / UC Course  I have reviewed the triage vital signs and the nursing notes.  Pertinent labs & imaging results that were available during my care of the patient were reviewed by me and considered in my medical decision making (see chart for details).  Clinical Course   Patient had significant relief after cerumen interrogation and ears appear to be clear on discharge.  Final Clinical Impressions(s) / UC  Diagnoses   Final diagnoses:  Impacted cerumen, unspecified laterality    New Prescriptions New Prescriptions   No medications on file     Robyn Haber, MD 01/18/16 1329

## 2016-01-18 NOTE — Telephone Encounter (Signed)
Patient is calling saying PA is needed for Fioricet.

## 2016-01-18 NOTE — ED Triage Notes (Signed)
The patient presented to the [facility] with a complaint of left ear pain and a headache for over 1 week.

## 2016-01-19 NOTE — Telephone Encounter (Signed)
Rn call [facility] to do PA. PA number pending is [phone].

## 2016-01-20 DIAGNOSIS — E1129 Type 2 diabetes mellitus with other diabetic kidney complication: Secondary | ICD-10-CM | POA: Diagnosis not present

## 2016-01-20 DIAGNOSIS — D631 Anemia in chronic kidney disease: Secondary | ICD-10-CM | POA: Diagnosis not present

## 2016-01-20 DIAGNOSIS — N2581 Secondary hyperparathyroidism of renal origin: Secondary | ICD-10-CM | POA: Diagnosis not present

## 2016-01-20 DIAGNOSIS — N186 End stage renal disease: Secondary | ICD-10-CM | POA: Diagnosis not present

## 2016-01-23 ENCOUNTER — Telehealth: Payer: Self-pay | Admitting: Neurology

## 2016-01-23 DIAGNOSIS — N186 End stage renal disease: Secondary | ICD-10-CM | POA: Diagnosis not present

## 2016-01-23 DIAGNOSIS — N2581 Secondary hyperparathyroidism of renal origin: Secondary | ICD-10-CM | POA: Diagnosis not present

## 2016-01-23 DIAGNOSIS — D631 Anemia in chronic kidney disease: Secondary | ICD-10-CM | POA: Diagnosis not present

## 2016-01-23 DIAGNOSIS — E1129 Type 2 diabetes mellitus with other diabetic kidney complication: Secondary | ICD-10-CM | POA: Diagnosis not present

## 2016-01-23 NOTE — Telephone Encounter (Signed)
Patient is calling stating he has had headaches off and on for a month and would like a soon appointment. He is scheduled with Dr. Erlinda Hong in March.

## 2016-01-23 NOTE — Telephone Encounter (Signed)
I agree. Thanks  Rosalin Hawking, MD PhD Stroke Neurology 01/23/2016 5:11 PM

## 2016-01-23 NOTE — Telephone Encounter (Signed)
If patient calls back he can see the NP for headaches Its not a new issue. thanks

## 2016-01-25 DIAGNOSIS — E1129 Type 2 diabetes mellitus with other diabetic kidney complication: Secondary | ICD-10-CM | POA: Diagnosis not present

## 2016-01-25 DIAGNOSIS — N186 End stage renal disease: Secondary | ICD-10-CM | POA: Diagnosis not present

## 2016-01-25 DIAGNOSIS — N2581 Secondary hyperparathyroidism of renal origin: Secondary | ICD-10-CM | POA: Diagnosis not present

## 2016-01-25 DIAGNOSIS — D631 Anemia in chronic kidney disease: Secondary | ICD-10-CM | POA: Diagnosis not present

## 2016-01-26 NOTE — Telephone Encounter (Signed)
Rn call [facility] and they stated medication was denied. Rn call [facility] to find out which insurance is patient using. The pharmacy stated the pt picked up the floriect on 01/18/2016 and paid 20.00 for it. Rn does not know why the pt called about a PA. Case closed for PA.Pt did pick up medication.

## 2016-01-27 DIAGNOSIS — E1129 Type 2 diabetes mellitus with other diabetic kidney complication: Secondary | ICD-10-CM | POA: Diagnosis not present

## 2016-01-27 DIAGNOSIS — N186 End stage renal disease: Secondary | ICD-10-CM | POA: Diagnosis not present

## 2016-01-27 DIAGNOSIS — N2581 Secondary hyperparathyroidism of renal origin: Secondary | ICD-10-CM | POA: Diagnosis not present

## 2016-01-27 DIAGNOSIS — D631 Anemia in chronic kidney disease: Secondary | ICD-10-CM | POA: Diagnosis not present

## 2016-01-30 ENCOUNTER — Ambulatory Visit: Payer: Medicare Other | Admitting: Nurse Practitioner

## 2016-01-30 ENCOUNTER — Telehealth: Payer: Self-pay

## 2016-01-30 DIAGNOSIS — D631 Anemia in chronic kidney disease: Secondary | ICD-10-CM | POA: Diagnosis not present

## 2016-01-30 DIAGNOSIS — E1129 Type 2 diabetes mellitus with other diabetic kidney complication: Secondary | ICD-10-CM | POA: Diagnosis not present

## 2016-01-30 DIAGNOSIS — N186 End stage renal disease: Secondary | ICD-10-CM | POA: Diagnosis not present

## 2016-01-30 DIAGNOSIS — N2581 Secondary hyperparathyroidism of renal origin: Secondary | ICD-10-CM | POA: Diagnosis not present

## 2016-01-30 NOTE — Telephone Encounter (Signed)
Rn receive a message from [facility] front desk about pt having headache. Pt was sitting in the lobby. Rn arrive from lunch to speak with pt in lobby. RN spoke with pt in lobby about 1245pm. Rn explain pt Dr. Erlinda Hong was out of the office until next year. Rn stated he can seen by the NP for his headache issue. Pt schedule appt with Hoyle Sauer at 0215pm today.Rn was notify pt left because he could not wait. Pt was schedule in a open slot to be evaluate and seen for the headache.

## 2016-01-31 ENCOUNTER — Encounter: Payer: Self-pay | Admitting: Nurse Practitioner

## 2016-01-31 DIAGNOSIS — H35033 Hypertensive retinopathy, bilateral: Secondary | ICD-10-CM | POA: Diagnosis not present

## 2016-01-31 DIAGNOSIS — H47323 Drusen of optic disc, bilateral: Secondary | ICD-10-CM | POA: Diagnosis not present

## 2016-01-31 DIAGNOSIS — H43392 Other vitreous opacities, left eye: Secondary | ICD-10-CM | POA: Diagnosis not present

## 2016-01-31 DIAGNOSIS — H17822 Peripheral opacity of cornea, left eye: Secondary | ICD-10-CM | POA: Diagnosis not present

## 2016-01-31 DIAGNOSIS — H26493 Other secondary cataract, bilateral: Secondary | ICD-10-CM | POA: Diagnosis not present

## 2016-01-31 DIAGNOSIS — Z961 Presence of intraocular lens: Secondary | ICD-10-CM | POA: Diagnosis not present

## 2016-02-01 DIAGNOSIS — D631 Anemia in chronic kidney disease: Secondary | ICD-10-CM | POA: Diagnosis not present

## 2016-02-01 DIAGNOSIS — E1129 Type 2 diabetes mellitus with other diabetic kidney complication: Secondary | ICD-10-CM | POA: Diagnosis not present

## 2016-02-01 DIAGNOSIS — N2581 Secondary hyperparathyroidism of renal origin: Secondary | ICD-10-CM | POA: Diagnosis not present

## 2016-02-01 DIAGNOSIS — N186 End stage renal disease: Secondary | ICD-10-CM | POA: Diagnosis not present

## 2016-02-03 DIAGNOSIS — N2581 Secondary hyperparathyroidism of renal origin: Secondary | ICD-10-CM | POA: Diagnosis not present

## 2016-02-03 DIAGNOSIS — D631 Anemia in chronic kidney disease: Secondary | ICD-10-CM | POA: Diagnosis not present

## 2016-02-03 DIAGNOSIS — N186 End stage renal disease: Secondary | ICD-10-CM | POA: Diagnosis not present

## 2016-02-03 DIAGNOSIS — E1129 Type 2 diabetes mellitus with other diabetic kidney complication: Secondary | ICD-10-CM | POA: Diagnosis not present

## 2016-02-05 DIAGNOSIS — D631 Anemia in chronic kidney disease: Secondary | ICD-10-CM | POA: Diagnosis not present

## 2016-02-05 DIAGNOSIS — N2581 Secondary hyperparathyroidism of renal origin: Secondary | ICD-10-CM | POA: Diagnosis not present

## 2016-02-05 DIAGNOSIS — N186 End stage renal disease: Secondary | ICD-10-CM | POA: Diagnosis not present

## 2016-02-05 DIAGNOSIS — E1129 Type 2 diabetes mellitus with other diabetic kidney complication: Secondary | ICD-10-CM | POA: Diagnosis not present

## 2016-02-08 DIAGNOSIS — N2581 Secondary hyperparathyroidism of renal origin: Secondary | ICD-10-CM | POA: Diagnosis not present

## 2016-02-08 DIAGNOSIS — E1129 Type 2 diabetes mellitus with other diabetic kidney complication: Secondary | ICD-10-CM | POA: Diagnosis not present

## 2016-02-08 DIAGNOSIS — N186 End stage renal disease: Secondary | ICD-10-CM | POA: Diagnosis not present

## 2016-02-08 DIAGNOSIS — D631 Anemia in chronic kidney disease: Secondary | ICD-10-CM | POA: Diagnosis not present

## 2016-02-10 DIAGNOSIS — N2581 Secondary hyperparathyroidism of renal origin: Secondary | ICD-10-CM | POA: Diagnosis not present

## 2016-02-10 DIAGNOSIS — D631 Anemia in chronic kidney disease: Secondary | ICD-10-CM | POA: Diagnosis not present

## 2016-02-10 DIAGNOSIS — N186 End stage renal disease: Secondary | ICD-10-CM | POA: Diagnosis not present

## 2016-02-10 DIAGNOSIS — E1129 Type 2 diabetes mellitus with other diabetic kidney complication: Secondary | ICD-10-CM | POA: Diagnosis not present

## 2016-02-12 DIAGNOSIS — N2581 Secondary hyperparathyroidism of renal origin: Secondary | ICD-10-CM | POA: Diagnosis not present

## 2016-02-12 DIAGNOSIS — Z992 Dependence on renal dialysis: Secondary | ICD-10-CM | POA: Diagnosis not present

## 2016-02-12 DIAGNOSIS — E1129 Type 2 diabetes mellitus with other diabetic kidney complication: Secondary | ICD-10-CM | POA: Diagnosis not present

## 2016-02-12 DIAGNOSIS — N186 End stage renal disease: Secondary | ICD-10-CM | POA: Diagnosis not present

## 2016-02-12 DIAGNOSIS — I12 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 stage renal disease: Secondary | ICD-10-CM | POA: Diagnosis not present

## 2016-02-12 DIAGNOSIS — D631 Anemia in chronic kidney disease: Secondary | ICD-10-CM | POA: Diagnosis not present

## 2016-02-15 DIAGNOSIS — N186 End stage renal disease: Secondary | ICD-10-CM | POA: Diagnosis not present

## 2016-02-15 DIAGNOSIS — E1129 Type 2 diabetes mellitus with other diabetic kidney complication: Secondary | ICD-10-CM | POA: Diagnosis not present

## 2016-02-15 DIAGNOSIS — N2581 Secondary hyperparathyroidism of renal origin: Secondary | ICD-10-CM | POA: Diagnosis not present

## 2016-02-15 DIAGNOSIS — D509 Iron deficiency anemia, unspecified: Secondary | ICD-10-CM | POA: Diagnosis not present

## 2016-02-15 DIAGNOSIS — D631 Anemia in chronic kidney disease: Secondary | ICD-10-CM | POA: Diagnosis not present

## 2016-02-16 ENCOUNTER — Ambulatory Visit (HOSPITAL_COMMUNITY)
Admission: EM | Admit: 2016-02-16 | Discharge: 2016-02-16 | Disposition: A | Discharge status: Home / Self Care | Payer: Medicare Other | Attending: Emergency Medicine | Admitting: Emergency Medicine

## 2016-02-16 ENCOUNTER — Encounter (HOSPITAL_COMMUNITY): Payer: Self-pay | Admitting: Emergency Medicine

## 2016-02-16 DIAGNOSIS — R519 Headache, unspecified: Secondary | ICD-10-CM

## 2016-02-16 DIAGNOSIS — G8929 Other chronic pain: Secondary | ICD-10-CM

## 2016-02-16 DIAGNOSIS — R51 Headache: Secondary | ICD-10-CM

## 2016-02-16 MED ORDER — KETOROLAC TROMETHAMINE 30 MG/ML IJ SOLN
INTRAMUSCULAR | Status: AC
  Filled 2016-02-16: qty 1

## 2016-02-16 MED ORDER — METOCLOPRAMIDE HCL 5 MG/ML IJ SOLN
INTRAMUSCULAR | Status: AC
  Filled 2016-02-16: qty 2

## 2016-02-16 MED ORDER — DEXAMETHASONE SODIUM PHOSPHATE 10 MG/ML IJ SOLN
10.0000 mg | Freq: Once | INTRAMUSCULAR | Status: AC
  Administered 2016-02-16: 10 mg via INTRAMUSCULAR

## 2016-02-16 MED ORDER — DEXAMETHASONE SODIUM PHOSPHATE 10 MG/ML IJ SOLN
INTRAMUSCULAR | Status: AC
  Filled 2016-02-16: qty 1

## 2016-02-16 MED ORDER — METOCLOPRAMIDE HCL 5 MG/ML IJ SOLN
5.0000 mg | Freq: Once | INTRAMUSCULAR | Status: AC
  Administered 2016-02-16: 5 mg via INTRAMUSCULAR

## 2016-02-16 MED ORDER — KETOROLAC TROMETHAMINE 60 MG/2ML IM SOLN
30.0000 mg | Freq: Once | INTRAMUSCULAR | Status: AC
  Administered 2016-02-16: 30 mg via INTRAMUSCULAR

## 2016-02-16 NOTE — ED Triage Notes (Signed)
Here for intermittent HA onset last night that is becoming more constant  Taking ibup, Excedrin w/no relief.   See a neurologist but he is out of town.   A&O x4... NAD

## 2016-02-16 NOTE — Discharge Instructions (Signed)
The specific reason for your headache is uncertain. According to your history as well as the results of your head scans in the past year or so it is difficult to determine the type of headache you have. Hopefully he will continue to improve later tonight. If not or if he get worse or have new symptoms or problems go directly to the emergency department. Call your doctor tomorrow to try to get an appointment.

## 2016-02-16 NOTE — ED Provider Notes (Signed)
CSN: [id number]     Arrival date & time 02/16/16  1628 History   First MD Initiated Contact with Patient 02/16/16 1641     Chief Complaint  Patient presents with  . Migraine   (Consider location/radiation/quality/duration/timing/severity/associated sxs/prior Treatment) This 56 year old male complaining of headache nearly every day for several months. He has been seen by his PCP as well as neurologist. He has also had CT scans, MRIs and MRAs. There have been abnormalities a little over a year ago as well as a history of CVA. He has been treated with Fioricet. He states it is not working. The headache involves the left hemicranium and describes it as pulsating and pounding. It is been getting worse since chest today. He has had nausea 3 to4 days ago but not since then. He denies problems with vision, speech, hearing, swallowing, focal paresthesias or weakness, problems with memory, orientation or cognition. He states he is really not had any neurologic problems with his headaches since they began. He has taken several OTC medications including ibuprofen and Excedrin and others.  His past medical history is significant for anemia, chronic headaches, end-stage renal disease on dialysis and on renal transplant list, diverticulitis, hypertension, chronic sleep apnea and CVA.      Past Medical History:  Diagnosis Date  . Anemia   . Chronic headaches   . Chronic kidney disease    Chronic Renal Failure; On Renal Transplant List  . Diverticulitis   . GI bleed   . Hemodialysis patient ([place])    M-W-F  . Hypertension   . Presence of arteriovenous fistula for hemodialysis, primary ([place])    RUE  . Refusal of blood product    NO WHOLE BLOOD PROUCTS  . Sleep apnea   . Stroke [facility])    Past Surgical History:  Procedure Laterality Date  . AV FISTULA PLACEMENT Right    right arm  . COLONOSCOPY N/A 08/04/2015   Procedure: COLONOSCOPY;  Surgeon: Jerene Bears, MD;  Location: [facility] ENDOSCOPY;  Service:  Endoscopy;  Laterality: N/A;  . graft left arm Left    for dialysis x 2  . INSERTION OF DIALYSIS CATHETER     Rt chest  . LAPAROSCOPIC RIGHT COLECTOMY N/A 08/05/2015   Procedure: LAPAROSCOPIC RIGHT COLECTOMY- ASCENDING;  Surgeon: Stark Klein, MD;  Location: [facility] OR;  Service: General;  Laterality: N/A;   Family History  Problem Relation Age of Onset  . Diabetes Father   . Stroke Father   . Hypertension Father   . Uterine cancer Mother   . Lupus Sister   . Stroke Sister   . Anuerysm Brother     brain  . Hypertension Sister    Social History  Substance Use Topics  . Smoking status: Former Smoker    Quit date: 06/19/1990  . Smokeless tobacco: Never Used  . Alcohol use 0.6 oz/week    1 Cans of beer per week     Comment: occasional    Review of Systems  Constitutional: Positive for activity change and appetite change. Negative for fever.  HENT: Negative for congestion, rhinorrhea, sinus pressure and sore throat.   Eyes: Negative.  Negative for visual disturbance.  Respiratory: Negative.   Cardiovascular: Negative for chest pain.  Genitourinary: Negative.   Skin: Negative.   Neurological: Negative for tremors, syncope, facial asymmetry, speech difficulty and weakness.  Psychiatric/Behavioral: Negative.  Negative for confusion. The patient is not nervous/anxious.   All other systems reviewed and are negative.   Allergies  Infed [iron dextran]; Oxycodone; Vicodin [hydrocodone-acetaminophen]; Diclofenac; and Whole blood  Home Medications   Prior to Admission medications   Medication Sig Start Date End Date Taking? Authorizing Provider  aspirin EC 81 MG tablet Take 1 tablet (81 mg total) by mouth daily. 08/26/14  Yes Rosalin Hawking, MD  atorvastatin (LIPITOR) 10 MG tablet Take 1 tablet (10 mg total) by mouth daily at 6 PM. 05/25/14  Yes Thurnell Lose, MD  butalbital-acetaminophen-caffeine (FIORICET, ESGIC) 50-325-40 MG tablet Take 1 tablet by mouth every 12 (twelve) hours as needed  for headache. No more than 2 tabs a day and no more than 5 tabs a week. 01/17/16  Yes Garvin Fila, MD  calcium acetate (PHOSLO) 667 MG capsule Take 2,001 mg by mouth 3 (three) times daily with meals.    Yes Historical Provider, MD  cinacalcet (SENSIPAR) 90 MG tablet Take 1 tablet (90 mg total) by mouth 2 (two) times daily. 08/10/15  Yes Lavina Hamman, MD  folic acid-vitamin b complex-vitamin c-selenium-zinc (DIALYVITE) 3 MG TABS tablet Take 1 tablet by mouth daily.   Yes Historical Provider, MD  gabapentin (NEURONTIN) 100 MG capsule Take 100 mg by mouth 2 (two) times daily.  09/29/14  Yes Historical Provider, MD  ibuprofen (ADVIL,MOTRIN) 800 MG tablet Take 800 mg by mouth every 6 (six) hours as needed for mild pain.  10/14/15  Yes Historical Provider, MD  lanthanum (FOSRENOL) 1000 MG chewable tablet Chew 1,000 mg by mouth 3 (three) times daily with meals.   Yes Historical Provider, MD  levETIRAcetam (KEPPRA) 750 MG tablet Take 1 tablet (750 mg total) by mouth 2 (two) times daily. 12/03/15  Yes Maryellen Pile, MD  docusate sodium (COLACE) 100 MG capsule Take 1 capsule (100 mg total) by mouth 2 (two) times daily as needed for mild constipation. 08/10/15   Lavina Hamman, MD  omeprazole (PRILOSEC) 20 MG capsule Take 1 capsule (20 mg total) by mouth daily. 30-60 mins before breakfast. 12/15/15   Levin Erp, PA  ondansetron (ZOFRAN) 4 MG tablet Take 1 tablet (4 mg total) by mouth every 6 (six) hours. 09/10/15   Vira Blanco, MD  psyllium (REGULOID) 0.52 g capsule Take 0.52 g by mouth daily. 2 caps with meals    Historical Provider, MD  sorbitol 70 % solution Take 15-60 mLs by mouth daily as needed (dialysis days).     Historical Provider, MD   Meds Ordered and Administered this Visit   Medications  ketorolac (TORADOL) injection 30 mg (30 mg Intramuscular Given 02/16/16 1821)  metoCLOPramide (REGLAN) injection 5 mg (5 mg Intramuscular Given 02/16/16 1822)  dexamethasone (DECADRON) injection 10 mg  (10 mg Intramuscular Given 02/16/16 1821)    BP 131/93 (BP Location: Left Arm)   Pulse 92   Temp 97.6 F (36.4 C) (Oral)   Resp 18   SpO2 99%  No data found.   Physical Exam  Constitutional: He is oriented to person, place, and time. He appears well-developed and well-nourished. No distress.  HENT:  Head: Normocephalic and atraumatic.  Mouth/Throat: No oropharyngeal exudate.  Bilateral TMs normal, no hemotympanum. Oropharynx is clear and moist. Soft palate rises symmetrically. Tongue and uvula midline.  Eyes: Conjunctivae and EOM are normal. Pupils are equal, round, and reactive to light.  Neck: Normal range of motion. Neck supple. No tracheal deviation present.  Cardiovascular: Normal rate, regular rhythm and normal heart sounds.   Pulmonary/Chest: Effort normal and breath sounds normal. No respiratory distress.  Abdominal: Soft. There  is no tenderness. There is no rebound.  Musculoskeletal: Normal range of motion. He exhibits no edema or tenderness.  Lymphadenopathy:    He has no cervical adenopathy.  Neurological: He is alert and oriented to person, place, and time. He has normal strength. No cranial nerve deficit or sensory deficit. Coordination normal. GCS eye subscore is 4. GCS verbal subscore is 5. GCS motor subscore is 6.  Neurologic exam grossly intact. No neurologic symptoms other than headache.  Skin: Skin is warm and dry. No rash noted.  Psychiatric: He has a normal mood and affect.  Nursing note and vitals reviewed.   Urgent Care Course   Clinical Course     Procedures (including critical care time)  Labs Review Labs Reviewed - No data to display  Imaging Review No results found.   Visual Acuity Review  Right Eye Distance:   Left Eye Distance:   Bilateral Distance:    Right Eye Near:   Left Eye Near:    Bilateral Near:         MDM   1. Chronic nonintractable headache, unspecified headache type    Patient was administered a headache cocktail.  He had mild relief. He states he prefers to go home. He has been made aware of neurologic signs and symptoms that may indicate a more severe condition. He states he understands and will go to the emergency department if this occurs. He also plans to see his PCP tomorrow. He is given headache instructions. No prescriptions at this time. Meds ordered this encounter  Medications  . ketorolac (TORADOL) injection 30 mg  . metoCLOPramide (REGLAN) injection 5 mg  . dexamethasone (DECADRON) injection 10 mg       Janne Napoleon, NP 02/16/16 1936

## 2016-02-17 DIAGNOSIS — N186 End stage renal disease: Secondary | ICD-10-CM | POA: Diagnosis not present

## 2016-02-17 DIAGNOSIS — D509 Iron deficiency anemia, unspecified: Secondary | ICD-10-CM | POA: Diagnosis not present

## 2016-02-17 DIAGNOSIS — E1129 Type 2 diabetes mellitus with other diabetic kidney complication: Secondary | ICD-10-CM | POA: Diagnosis not present

## 2016-02-17 DIAGNOSIS — D631 Anemia in chronic kidney disease: Secondary | ICD-10-CM | POA: Diagnosis not present

## 2016-02-17 DIAGNOSIS — N2581 Secondary hyperparathyroidism of renal origin: Secondary | ICD-10-CM | POA: Diagnosis not present

## 2016-02-20 DIAGNOSIS — D509 Iron deficiency anemia, unspecified: Secondary | ICD-10-CM | POA: Diagnosis not present

## 2016-02-20 DIAGNOSIS — E1129 Type 2 diabetes mellitus with other diabetic kidney complication: Secondary | ICD-10-CM | POA: Diagnosis not present

## 2016-02-20 DIAGNOSIS — D631 Anemia in chronic kidney disease: Secondary | ICD-10-CM | POA: Diagnosis not present

## 2016-02-20 DIAGNOSIS — N2581 Secondary hyperparathyroidism of renal origin: Secondary | ICD-10-CM | POA: Diagnosis not present

## 2016-02-20 DIAGNOSIS — N186 End stage renal disease: Secondary | ICD-10-CM | POA: Diagnosis not present

## 2016-02-21 ENCOUNTER — Encounter: Payer: Self-pay | Admitting: Neurology

## 2016-02-21 ENCOUNTER — Ambulatory Visit (INDEPENDENT_AMBULATORY_CARE_PROVIDER_SITE_OTHER): Payer: Medicare Other | Admitting: Neurology

## 2016-02-21 VITALS — BP 113/70 | HR 96 | Wt 196.8 lb

## 2016-02-21 DIAGNOSIS — I6783 Posterior reversible encephalopathy syndrome: Secondary | ICD-10-CM

## 2016-02-21 DIAGNOSIS — N186 End stage renal disease: Secondary | ICD-10-CM

## 2016-02-21 DIAGNOSIS — R569 Unspecified convulsions: Secondary | ICD-10-CM | POA: Diagnosis not present

## 2016-02-21 DIAGNOSIS — I10 Essential (primary) hypertension: Secondary | ICD-10-CM

## 2016-02-21 DIAGNOSIS — G039 Meningitis, unspecified: Secondary | ICD-10-CM

## 2016-02-21 DIAGNOSIS — G4489 Other headache syndrome: Secondary | ICD-10-CM | POA: Diagnosis not present

## 2016-02-21 DIAGNOSIS — G031 Chronic meningitis: Secondary | ICD-10-CM

## 2016-02-21 DIAGNOSIS — E785 Hyperlipidemia, unspecified: Secondary | ICD-10-CM | POA: Diagnosis not present

## 2016-02-21 DIAGNOSIS — Z992 Dependence on renal dialysis: Secondary | ICD-10-CM | POA: Diagnosis not present

## 2016-02-21 NOTE — Progress Notes (Signed)
STROKE NEUROLOGY FOLLOW UP NOTE  NAME: Carl Frane Sr. DOB: May 01, 1960  REASON FOR VISIT: stroke follow up HISTORY FROM: pt and chart  Today we had the pleasure of seeing Carl Marro Sr. in follow-up at our Neurology Clinic. Pt was accompanied by no one.   History Summary [name] is a 56 y.o. male with history of HTN, ESRD on HD was admitted for acute onset of dizziness on 05/22/14. He was helping his brother for car repair and struck his head at the top to the hood. A couple of hours later, he felt very dizzy and not right. 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 Also, right occipital pole has tiny DWI changes which may related to PRES. He was admitted for further evaluation. Stroke work up including MRA, TTE and CUS unremarkable. LDL 91. During hospitalization, his dizziness resolved and he was discharged with ASA 325 and lipitor.   08/16/14 follow up - the patient has been doing well. No recurrent stroke symptoms or seizure. However, he had LGIB in 06/2014 and was admitted. Hb as low as to 5.4. He refused blood transfusion. His ASA was d/c'ed. LGIB gradually stopped and last check Hb was 10.4. Currently, he feels good without any abdominal pain and no bleeding. He is in the waiting list for kidney transplantation. BP today 130/86.  11/02/14 follow up - the patient has been doing well. No recurrent symptoms. His GIB resolved and he is on ASA without problems. BP 116/75 today. No HA or seizure. No vision changes.    05/05/15 follow up - he has been doing well. He went to ED on 02/05/15 for migraine headache. CT brain no acute findings. Improved after pain management in ED. Given fioricet for HA and as per report he felt a lot better with the medication. Otherwise, pt has no complains. Continued on HD 3/week.   10/20/15 follow up - he was admitted in 07/2015 for acute LGIB and  blood loss anemia. Had GI consult and colonoscopy showed colon ulcer and nearly perforated cecal mass. Had colectomy with anastomosis and biopsy showed no malignancy. Treated with IV zosyn for 7 days. His ASA resumed. He went to ED again in 08/2015 for abdominal pain and CT found to have diverticulitis, and he was treated with cipro and flagyl. In 09/2015 he was admitted for atypical chest pain, EKG showed diffuse ST elevations concerning for pericardial effusion. Cardiac enzyme negative. However, TTE did not show pericardial effusion. He was treated with low dose colchicine for ? Uremic pericarditis.   Pt neurologically stable since last visit, BP good or low at home, today in clinic 104/71. Had b/l cataract surgery about 3 months ago as per pt. Still has intermittent HA, same as before, sometimes more severe than others. Stated fioricet helped in the past but no more refills. No fever or vision changes.   12/02/15 admission - he was admitted for sudden onset RUE jerking, numbness and weakness, resolved prior to ED arrival. There was also equivocal right facial twitching at that time. MRI and MRA no acute abnormality, but still chronic dural thickening. EEG normal. LDL 72 and A1c 4.8. Due to concerning for seizure, he was put on Keppra 750 mg.  Since then, patient HA getting more frequent, almost daily, comes and goes, at top of head, punding, 6-7/10. Starting this month, HA more severe than before, he has to  use fioricet and ibuprofen. On 02/16/16, he went to ED due to severe HA at midnight. Was given migraine cocktail and improved before discharge.   Interval History During the interval time, pt has been doing well. No more seizure like activity. Still has HA but mild. Had eye exam 2 weeks ago at Dr. Katy [name] office, but not remember the result. He is still on keppra 750mg  daily, no addition dose after HD. BP 113/70.   REVIEW OF SYSTEMS: Full 14 system review of systems performed and notable only for those  listed below and in HPI above, all others are negative:  Constitutional:   Cardiovascular:  Ear/Nose/Throat:   Skin:  Eyes:  Eye pain Respiratory:   Gastroitestinal:  Nausea Genitourinary:  Hematology/Lymphatic:   Endocrine:  Musculoskeletal:   Allergy/Immunology:   Neurological:  HA Psychiatric:  Sleep:   The following represents the patient's updated allergies and side effects list: Allergies  Allergen Reactions  . Infed [Iron Dextran] Other (See Comments)    Decreased BP   . Oxycodone Nausea Only  . Vicodin [Hydrocodone-Acetaminophen] Other (See Comments)    Decreased BP  . Diclofenac Other (See Comments)    Unknown  . Whole Blood Other (See Comments)    Unk    The neurologically relevant items on the patient's problem list were reviewed on today's visit.  Neurologic Examination  A problem focused neurological exam (12 or more points of the single system neurologic examination, vital signs counts as 1 point, cranial nerves count for 8 points) was performed.  Blood pressure 113/70, pulse 96, weight 196 lb 12.8 oz (89.3 kg).  General - Well nourished, well developed, in no apparent distress.  Ophthalmologic - not able to visualize due to small pupils.   Cardiovascular - Regular rate and rhythm with no murmur.  Mental Status -  Level of arousal and orientation to time, place, and person were intact. Language including expression, naming, repetition, comprehension was assessed and found intact. Attention span and concentration were normal. Fund of Knowledge was assessed and was intact.  Cranial Nerves II - XII - II - Visual field intact OU. III, IV, VI - Extraocular movements intact. V - Facial sensation intact bilaterally. VII - Facial movement intact bilaterally. VIII - Hearing & vestibular intact bilaterally. X - Palate elevates symmetrically. XI - Chin turning & shoulder shrug intact bilaterally. XII - Tongue protrusion intact.  Motor Strength - The  patient's strength was normal in all extremities and pronator drift was absent.  Bulk was normal and fasciculations were absent.   Motor Tone - Muscle tone was assessed at the neck and appendages and was normal.  Reflexes - The patient's reflexes were 1+ in all extremities and he had no pathological reflexes.  Sensory - Light touch, temperature/pinprick were assessed and were normal.    Coordination - The patient had normal movements in the hands and feet with no ataxia or dysmetria.  Tremor was absent.  Gait and Station - The patient's transfers, posture, gait, station, and turns were observed as normal.  Data reviewed: I personally reviewed the images and agree with the radiology interpretations.  Ct Head (brain) Wo Contrast 05/22/2014 Prominent dural calcification along the falx and tentorium again demonstrated. 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 No acute intracranial abnormalities. No significant change since previous study.   MRI HEAD  05/23/2014 Diffuse dural calcifications/dural thickening. Parietal-occipital FLAIR abnormality greater on the right suspicious for posterior reversible encephalopathy syndrome. Other potential causes for this appearance include  that secondary to; artifact, meningitis, subarachnoid hemorrhage or result of external supplemental oxygen. Adequate flow within the superior sagittal sinus cannot be confirmed. This appearance may be chronic and related to the adjacent dural thickening however, sagittal sinus thrombosis could contribute to the parietal occipital FLAIR abnormality. Suggestion of tiny acute posterior left corona radiata infarct. Subtle restricted motion medial aspect of the right parietal -occipital lobe may represent result of posterior reversible encephalopathy syndrome or small infarct. Abnormal vessel extends from the left internal cerebral vein level along the left ambient cistern to the superior margin of the left internal auditory  canal. Question small dural fistula. Opacification left petrous apex. Abnormal bone marrow may represent anemia of renal disease. 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 Left parotid 1.4 x 1 centimeter nonspecific lesion incompletely assessed on present exam.   MRA HEAD  05/23/2014 Exam is motion degraded. Mild narrowing and irregularity right internal carotid artery cavernous segment without high-grade stenosis. Prominent left posterior communicating artery origin infundibulum without discrete aneurysm. Hypoplastic A1 segment right anterior cerebral artery. Azygous configuration A2 segment anterior cerebral artery. Middle cerebral artery mild to moderate branch vessel narrowing and irregularity. Non visualized left posterior inferior cerebellar artery. Mild narrowing distal right vertebral artery. Nonvisualized right anterior inferior cerebellar artery. Posterior cerebral artery branch vessel irregularity. Irregularity and narrowing superior cerebellar artery more notable on the right.   2D echo - - Left ventricle: The cavity size was normal. Systolic function was normal. The estimated ejection fraction was in the range of 55% to 60%. Wall motion was normal; there were no regional wall motion abnormalities. Doppler parameters are consistent with abnormal left ventricular relaxation (grade 1 diastolic dysfunction). - Pericardium, extracardiac: A trivial pericardial effusion was identified.  CUS - Bilateral: 1-39% ICA stenosis. Vertebral artery flow is antegrade.  MRI head 09/16/14 - Abnormal MRI scan of the brain showing diffuse dural thickening with abnormal appearance of bilateral parieto-occipital cortex on T2/FLAIR images the finding of unclear significance with a differential discussed above. Stable incidental findings of Tornwaldt's cysts, opacification of the petrous apex and abnormal vessel in the ambient cistern.  MRA head 09/23/14 - Equivocal MRA head (without)  demonstrating: 1. Small (43mm) posterior outpouching of the left left supra-clinoid internal carotid artery, may represent infundibular dilation at origin of left posterior communicating artery vs aneurysmal dilation. 2. No focal stenoses or occlusions of the medium to large sized vessels.   MRV 09/16/14 - Highly abnormal appearance of superior sagittal sinus with diminutive caliber likely related to patient's chronic dural thickening. Abnormal appearance of deep cerebral venous system likely related to degenerative caliber from dural thickening versus partial thrombosis. Correlate with catheter angiogram if clinically necessary  CT head 02/05/15 1. No acute intracranial abnormality. 2. Stable appearance of the head with prominence dural calcifications.  MRV 05/19/15 1. Decreased flow signal in the superior sagittal sinus anteriorly, left transverse sinus near the midline, deep cerebral veins (great vein of Galen and straight sinus) and other areas as mentioned above.  2. Prominent cortical cerebral veins noted throughout. 3. These findings may be related to chronic dural calcification / thickening vs chronic thrombosis with partial recannalization. 4. Compared to MRV from 09/16/14, after accounting for differences in MRI field strengths, there is no significant change.   MRI brain 05/19/15 diffuse calcification and thickening of the tentorium with mild diffuse thickening of the meninges throughout. Abnormal appearance of and thickening of the right parieto-occipital cortex of unclear etiology. Abnormal blood vessel coursing through the ambient cistern to the left internal  auditory canal. Overall no significant change compared with previous MRI scan dated 09/16/14  MRI and MRA 12/02/15 1.  No acute intracranial abnormality. 2. Mild chronic small vessel disease appears stable since March. 2016 right parietal signal abnormality which was felt related to PRES has resolved without encephalomalacia. 3.  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 This appears stable since the MRV on 05/17/2015. 4. Stable since 2016 and largely unremarkable intracranial MRA. ICA siphon atherosclerosis without stenosis. Small right cavernous ICA segment pseudo-lesion versus infundibulum.  CUS 12/03/15 - Bilateral: intimal wall thickening CCA. Mild soft plaque origin ICA. 1-39% ICA plaquing. Vertebral artery flow is antegrade.  Component     Latest Ref Rng 05/23/2014 07/14/2014  Cholesterol     0 - 200 mg/dL 154   Triglycerides     <150 mg/dL 50   HDL Cholesterol     >39 mg/dL 53   Total CHOL/HDL Ratio      2.9   VLDL     0 - 40 mg/dL 10   LDL (calc)     0 - 99 mg/dL 91   Hemoglobin A1C     4.8 - 5.6 % 5.4   Mean Plasma Glucose      108   HIV Screen 4th Generation wRfx     Non Reactive  Non Reactive  Vitamin B-12     180 - 914 pg/mL  1227 (H)     Component     Latest Ref Rng & Units 10/02/2015  Cholesterol     0 - 200 mg/dL 129  Triglycerides     <150 mg/dL 64  HDL Cholesterol     >40 mg/dL 44  Total CHOL/HDL Ratio     RATIO 2.9  VLDL     0 - 40 mg/dL 13  LDL (calc)     0 - 99 mg/dL 72  Hemoglobin A1C     4.8 - 5.6 % 4.8  Mean Plasma Glucose     mg/dL 91    Assessment: As you may recall, he is a 56 y.o. African American male with PMH of HTN, ESRD on HD was admitted for acute onset of dizziness on 05/22/14. 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 Also, right occipital pole has tiny DWI changes which may related to PRES. Stroke work up including TTE and CUS unremarkable. LDL 91. His dizziness resolved and he was discharged with ASA 325 and lipitor. During the interval time, he developed LGIB, ASA was d/c'ed. His Hb now back to 10.4 and he feels good. Will need to resume baby ASA for stroke prevention. To document the resolution of PRES and to  rule out CVT as there is some concern about cerebral veins in MRI, we repeated MRI, MRA and MRV and found that pt continued to have T2 hyperintensity bilateral parieto-occipital coretex and also has significant dural thickening with abnormal appearance of deep cerebral venous system with degenerative caliber, which may be able to explain the possible edema at bilateral parietooccipital region. 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 Not sure if there is any association with ESRD on HD. However, currently pt has no sign of chronic infection, no hydrocephalus, no complaint of HA or vision changes. Will continue to monitor and repeat MRI in 6 months.   Had recent admissions for LGIB, found to have  colon ulcer and diverticulitis, treated with antibiotics, underwent colectomy with anastomosis and biopsy showed no malignancy. Also recent admission for atypical CP, EKG showed ST elevation, and cardiac enzyme and TTE negative, question for uremic pericarditis, put on low dose colchicine.   Had seizure like activity with RUE jerking, weakness and numbness, resolved before reaching ED. MRI and MRA no acute abnormality. EEG normal. Put on keppra 750mg . However, since then, he started to have worsening headache. Temporal relationship consistent with Keppra use, and as per "up to date", headache is a common side effect of Keppra, ranging from 14-19%. Another possibility for his headache is due to chronic dural thickening, which may interfere with CSF circulation. Pt seizure like activity no LOC, RUE brief jerking, EEG normal, no seizure in the past, will consider to stop keppra for observation. No need to restrict driving at this time.   Plan:  - continue ASA and lipitor for stroke prevention - discontinue Keppra for now to see if HA better - seizure precautions. - if HA not improving after stop keppra, will need to consider diamox for HA treatment -  will request eye exam record from Dr. Katy [name] office - Follow up with your primary care physician for stroke risk factor modification. Recommend maintain blood pressure goal <130/80, diabetes with hemoglobin A1c goal below 6.5% and lipids with LDL cholesterol goal below 70 mg/dL.  - follow up with nephrology for ESRD on HD - follow up with cardiology and GI regularly - check BP at home - follow up in 3 months with me  I spent more than 25 minutes of face to face time with the patient. Greater than 50% of time was spent in counseling and coordination of care. We have reviewed  headache etiology, trial of discontinuing Keppra, and seizure precautions.    No orders of the defined types were placed in this encounter.   No orders of the defined types were placed in this encounter.   Patient Instructions  - continue ASA and lipitor for stroke prevention - stop keppra for now to see if your HA better - seizure precautions. If you have seizure like activity please call us right away - if HA not improving after stop keppra, will need to consider diamox for HA treatment - will request eye exam record from Dr. Katy [name] office - Follow up with your primary care physician for stroke risk factor modification. Recommend maintain blood pressure goal <130/80, diabetes with hemoglobin A1c goal below 6.5% and lipids with LDL cholesterol goal below 70 mg/dL.  - follow up with nephrology for ESRD on HD - follow up with cardiology and GI regularly - check BP at home - follow up in 3 months with me   Rosalin Hawking, MD PhD [facility] [address], [place], [place] [address] [phone]

## 2016-02-21 NOTE — Patient Instructions (Addendum)
-   continue ASA and lipitor for stroke prevention - stop keppra for now to see if your HA better - seizure precautions. If you have seizure like activity please call us right away - if HA not improving after stop keppra, will need to consider diamox for HA treatment - will request eye exam record from Dr. Katy Fitch office - Follow up with your primary care physician for stroke risk factor modification. Recommend maintain blood pressure goal <130/80, diabetes with hemoglobin A1c goal below 6.5% and lipids with LDL cholesterol goal below 70 mg/dL.  - follow up with nephrology for ESRD on HD - follow up with cardiology and GI regularly - check BP at home - follow up in 3 months with me

## 2016-02-22 DIAGNOSIS — N186 End stage renal disease: Secondary | ICD-10-CM | POA: Diagnosis not present

## 2016-02-22 DIAGNOSIS — N2581 Secondary hyperparathyroidism of renal origin: Secondary | ICD-10-CM | POA: Diagnosis not present

## 2016-02-22 DIAGNOSIS — D631 Anemia in chronic kidney disease: Secondary | ICD-10-CM | POA: Diagnosis not present

## 2016-02-22 DIAGNOSIS — E1129 Type 2 diabetes mellitus with other diabetic kidney complication: Secondary | ICD-10-CM | POA: Diagnosis not present

## 2016-02-22 DIAGNOSIS — D509 Iron deficiency anemia, unspecified: Secondary | ICD-10-CM | POA: Diagnosis not present

## 2016-02-24 DIAGNOSIS — N186 End stage renal disease: Secondary | ICD-10-CM | POA: Diagnosis not present

## 2016-02-24 DIAGNOSIS — N2581 Secondary hyperparathyroidism of renal origin: Secondary | ICD-10-CM | POA: Diagnosis not present

## 2016-02-24 DIAGNOSIS — D631 Anemia in chronic kidney disease: Secondary | ICD-10-CM | POA: Diagnosis not present

## 2016-02-24 DIAGNOSIS — D509 Iron deficiency anemia, unspecified: Secondary | ICD-10-CM | POA: Diagnosis not present

## 2016-02-24 DIAGNOSIS — E1129 Type 2 diabetes mellitus with other diabetic kidney complication: Secondary | ICD-10-CM | POA: Diagnosis not present

## 2016-02-27 DIAGNOSIS — D631 Anemia in chronic kidney disease: Secondary | ICD-10-CM | POA: Diagnosis not present

## 2016-02-27 DIAGNOSIS — E1129 Type 2 diabetes mellitus with other diabetic kidney complication: Secondary | ICD-10-CM | POA: Diagnosis not present

## 2016-02-27 DIAGNOSIS — N2581 Secondary hyperparathyroidism of renal origin: Secondary | ICD-10-CM | POA: Diagnosis not present

## 2016-02-27 DIAGNOSIS — N186 End stage renal disease: Secondary | ICD-10-CM | POA: Diagnosis not present

## 2016-02-27 DIAGNOSIS — D509 Iron deficiency anemia, unspecified: Secondary | ICD-10-CM | POA: Diagnosis not present

## 2016-02-29 DIAGNOSIS — E1129 Type 2 diabetes mellitus with other diabetic kidney complication: Secondary | ICD-10-CM | POA: Diagnosis not present

## 2016-02-29 DIAGNOSIS — N2581 Secondary hyperparathyroidism of renal origin: Secondary | ICD-10-CM | POA: Diagnosis not present

## 2016-02-29 DIAGNOSIS — N186 End stage renal disease: Secondary | ICD-10-CM | POA: Diagnosis not present

## 2016-02-29 DIAGNOSIS — D509 Iron deficiency anemia, unspecified: Secondary | ICD-10-CM | POA: Diagnosis not present

## 2016-02-29 DIAGNOSIS — D631 Anemia in chronic kidney disease: Secondary | ICD-10-CM | POA: Diagnosis not present

## 2016-03-02 DIAGNOSIS — N2581 Secondary hyperparathyroidism of renal origin: Secondary | ICD-10-CM | POA: Diagnosis not present

## 2016-03-02 DIAGNOSIS — N186 End stage renal disease: Secondary | ICD-10-CM | POA: Diagnosis not present

## 2016-03-02 DIAGNOSIS — D631 Anemia in chronic kidney disease: Secondary | ICD-10-CM | POA: Diagnosis not present

## 2016-03-02 DIAGNOSIS — E1129 Type 2 diabetes mellitus with other diabetic kidney complication: Secondary | ICD-10-CM | POA: Diagnosis not present

## 2016-03-02 DIAGNOSIS — D509 Iron deficiency anemia, unspecified: Secondary | ICD-10-CM | POA: Diagnosis not present

## 2016-03-05 DIAGNOSIS — E1129 Type 2 diabetes mellitus with other diabetic kidney complication: Secondary | ICD-10-CM | POA: Diagnosis not present

## 2016-03-05 DIAGNOSIS — N186 End stage renal disease: Secondary | ICD-10-CM | POA: Diagnosis not present

## 2016-03-05 DIAGNOSIS — N2581 Secondary hyperparathyroidism of renal origin: Secondary | ICD-10-CM | POA: Diagnosis not present

## 2016-03-05 DIAGNOSIS — D509 Iron deficiency anemia, unspecified: Secondary | ICD-10-CM | POA: Diagnosis not present

## 2016-03-05 DIAGNOSIS — D631 Anemia in chronic kidney disease: Secondary | ICD-10-CM | POA: Diagnosis not present

## 2016-03-07 DIAGNOSIS — E1129 Type 2 diabetes mellitus with other diabetic kidney complication: Secondary | ICD-10-CM | POA: Diagnosis not present

## 2016-03-07 DIAGNOSIS — D509 Iron deficiency anemia, unspecified: Secondary | ICD-10-CM | POA: Diagnosis not present

## 2016-03-07 DIAGNOSIS — N2581 Secondary hyperparathyroidism of renal origin: Secondary | ICD-10-CM | POA: Diagnosis not present

## 2016-03-07 DIAGNOSIS — N186 End stage renal disease: Secondary | ICD-10-CM | POA: Diagnosis not present

## 2016-03-07 DIAGNOSIS — D631 Anemia in chronic kidney disease: Secondary | ICD-10-CM | POA: Diagnosis not present

## 2016-03-09 DIAGNOSIS — N2581 Secondary hyperparathyroidism of renal origin: Secondary | ICD-10-CM | POA: Diagnosis not present

## 2016-03-09 DIAGNOSIS — D509 Iron deficiency anemia, unspecified: Secondary | ICD-10-CM | POA: Diagnosis not present

## 2016-03-09 DIAGNOSIS — N186 End stage renal disease: Secondary | ICD-10-CM | POA: Diagnosis not present

## 2016-03-09 DIAGNOSIS — D631 Anemia in chronic kidney disease: Secondary | ICD-10-CM | POA: Diagnosis not present

## 2016-03-09 DIAGNOSIS — E1129 Type 2 diabetes mellitus with other diabetic kidney complication: Secondary | ICD-10-CM | POA: Diagnosis not present

## 2016-03-12 DIAGNOSIS — N2581 Secondary hyperparathyroidism of renal origin: Secondary | ICD-10-CM | POA: Diagnosis not present

## 2016-03-12 DIAGNOSIS — D509 Iron deficiency anemia, unspecified: Secondary | ICD-10-CM | POA: Diagnosis not present

## 2016-03-12 DIAGNOSIS — E1129 Type 2 diabetes mellitus with other diabetic kidney complication: Secondary | ICD-10-CM | POA: Diagnosis not present

## 2016-03-12 DIAGNOSIS — N186 End stage renal disease: Secondary | ICD-10-CM | POA: Diagnosis not present

## 2016-03-12 DIAGNOSIS — D631 Anemia in chronic kidney disease: Secondary | ICD-10-CM | POA: Diagnosis not present

## 2016-03-14 DIAGNOSIS — N186 End stage renal disease: Secondary | ICD-10-CM | POA: Diagnosis not present

## 2016-03-14 DIAGNOSIS — D509 Iron deficiency anemia, unspecified: Secondary | ICD-10-CM | POA: Diagnosis not present

## 2016-03-14 DIAGNOSIS — E1129 Type 2 diabetes mellitus with other diabetic kidney complication: Secondary | ICD-10-CM | POA: Diagnosis not present

## 2016-03-14 DIAGNOSIS — D631 Anemia in chronic kidney disease: Secondary | ICD-10-CM | POA: Diagnosis not present

## 2016-03-14 DIAGNOSIS — I12 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 stage renal disease: Secondary | ICD-10-CM | POA: Diagnosis not present

## 2016-03-14 DIAGNOSIS — N2581 Secondary hyperparathyroidism of renal origin: Secondary | ICD-10-CM | POA: Diagnosis not present

## 2016-03-14 DIAGNOSIS — Z992 Dependence on renal dialysis: Secondary | ICD-10-CM | POA: Diagnosis not present

## 2016-03-16 DIAGNOSIS — E1129 Type 2 diabetes mellitus with other diabetic kidney complication: Secondary | ICD-10-CM | POA: Diagnosis not present

## 2016-03-16 DIAGNOSIS — E875 Hyperkalemia: Secondary | ICD-10-CM | POA: Diagnosis not present

## 2016-03-16 DIAGNOSIS — D631 Anemia in chronic kidney disease: Secondary | ICD-10-CM | POA: Diagnosis not present

## 2016-03-16 DIAGNOSIS — N186 End stage renal disease: Secondary | ICD-10-CM | POA: Diagnosis not present

## 2016-03-16 DIAGNOSIS — N2581 Secondary hyperparathyroidism of renal origin: Secondary | ICD-10-CM | POA: Diagnosis not present

## 2016-03-19 DIAGNOSIS — D631 Anemia in chronic kidney disease: Secondary | ICD-10-CM | POA: Diagnosis not present

## 2016-03-19 DIAGNOSIS — E875 Hyperkalemia: Secondary | ICD-10-CM | POA: Diagnosis not present

## 2016-03-19 DIAGNOSIS — N2581 Secondary hyperparathyroidism of renal origin: Secondary | ICD-10-CM | POA: Diagnosis not present

## 2016-03-19 DIAGNOSIS — N186 End stage renal disease: Secondary | ICD-10-CM | POA: Diagnosis not present

## 2016-03-19 DIAGNOSIS — E1129 Type 2 diabetes mellitus with other diabetic kidney complication: Secondary | ICD-10-CM | POA: Diagnosis not present

## 2016-03-21 DIAGNOSIS — N2581 Secondary hyperparathyroidism of renal origin: Secondary | ICD-10-CM | POA: Diagnosis not present

## 2016-03-21 DIAGNOSIS — E875 Hyperkalemia: Secondary | ICD-10-CM | POA: Diagnosis not present

## 2016-03-21 DIAGNOSIS — D631 Anemia in chronic kidney disease: Secondary | ICD-10-CM | POA: Diagnosis not present

## 2016-03-21 DIAGNOSIS — E1129 Type 2 diabetes mellitus with other diabetic kidney complication: Secondary | ICD-10-CM | POA: Diagnosis not present

## 2016-03-21 DIAGNOSIS — N186 End stage renal disease: Secondary | ICD-10-CM | POA: Diagnosis not present

## 2016-03-23 DIAGNOSIS — N186 End stage renal disease: Secondary | ICD-10-CM | POA: Diagnosis not present

## 2016-03-23 DIAGNOSIS — N2581 Secondary hyperparathyroidism of renal origin: Secondary | ICD-10-CM | POA: Diagnosis not present

## 2016-03-23 DIAGNOSIS — D631 Anemia in chronic kidney disease: Secondary | ICD-10-CM | POA: Diagnosis not present

## 2016-03-23 DIAGNOSIS — E875 Hyperkalemia: Secondary | ICD-10-CM | POA: Diagnosis not present

## 2016-03-23 DIAGNOSIS — E1129 Type 2 diabetes mellitus with other diabetic kidney complication: Secondary | ICD-10-CM | POA: Diagnosis not present

## 2016-03-26 DIAGNOSIS — N186 End stage renal disease: Secondary | ICD-10-CM | POA: Diagnosis not present

## 2016-03-26 DIAGNOSIS — N2581 Secondary hyperparathyroidism of renal origin: Secondary | ICD-10-CM | POA: Diagnosis not present

## 2016-03-26 DIAGNOSIS — E1129 Type 2 diabetes mellitus with other diabetic kidney complication: Secondary | ICD-10-CM | POA: Diagnosis not present

## 2016-03-26 DIAGNOSIS — D631 Anemia in chronic kidney disease: Secondary | ICD-10-CM | POA: Diagnosis not present

## 2016-03-26 DIAGNOSIS — E875 Hyperkalemia: Secondary | ICD-10-CM | POA: Diagnosis not present

## 2016-03-28 DIAGNOSIS — E875 Hyperkalemia: Secondary | ICD-10-CM | POA: Diagnosis not present

## 2016-03-28 DIAGNOSIS — N186 End stage renal disease: Secondary | ICD-10-CM | POA: Diagnosis not present

## 2016-03-28 DIAGNOSIS — N2581 Secondary hyperparathyroidism of renal origin: Secondary | ICD-10-CM | POA: Diagnosis not present

## 2016-03-28 DIAGNOSIS — D631 Anemia in chronic kidney disease: Secondary | ICD-10-CM | POA: Diagnosis not present

## 2016-03-28 DIAGNOSIS — E1129 Type 2 diabetes mellitus with other diabetic kidney complication: Secondary | ICD-10-CM | POA: Diagnosis not present

## 2016-03-30 DIAGNOSIS — N186 End stage renal disease: Secondary | ICD-10-CM | POA: Diagnosis not present

## 2016-03-30 DIAGNOSIS — D631 Anemia in chronic kidney disease: Secondary | ICD-10-CM | POA: Diagnosis not present

## 2016-03-30 DIAGNOSIS — N2581 Secondary hyperparathyroidism of renal origin: Secondary | ICD-10-CM | POA: Diagnosis not present

## 2016-03-30 DIAGNOSIS — E1129 Type 2 diabetes mellitus with other diabetic kidney complication: Secondary | ICD-10-CM | POA: Diagnosis not present

## 2016-03-30 DIAGNOSIS — E875 Hyperkalemia: Secondary | ICD-10-CM | POA: Diagnosis not present

## 2016-04-02 DIAGNOSIS — N2581 Secondary hyperparathyroidism of renal origin: Secondary | ICD-10-CM | POA: Diagnosis not present

## 2016-04-02 DIAGNOSIS — E1129 Type 2 diabetes mellitus with other diabetic kidney complication: Secondary | ICD-10-CM | POA: Diagnosis not present

## 2016-04-02 DIAGNOSIS — E875 Hyperkalemia: Secondary | ICD-10-CM | POA: Diagnosis not present

## 2016-04-02 DIAGNOSIS — D631 Anemia in chronic kidney disease: Secondary | ICD-10-CM | POA: Diagnosis not present

## 2016-04-02 DIAGNOSIS — N186 End stage renal disease: Secondary | ICD-10-CM | POA: Diagnosis not present

## 2016-04-04 DIAGNOSIS — N186 End stage renal disease: Secondary | ICD-10-CM | POA: Diagnosis not present

## 2016-04-04 DIAGNOSIS — E1129 Type 2 diabetes mellitus with other diabetic kidney complication: Secondary | ICD-10-CM | POA: Diagnosis not present

## 2016-04-04 DIAGNOSIS — N2581 Secondary hyperparathyroidism of renal origin: Secondary | ICD-10-CM | POA: Diagnosis not present

## 2016-04-04 DIAGNOSIS — E875 Hyperkalemia: Secondary | ICD-10-CM | POA: Diagnosis not present

## 2016-04-04 DIAGNOSIS — D631 Anemia in chronic kidney disease: Secondary | ICD-10-CM | POA: Diagnosis not present

## 2016-04-05 DIAGNOSIS — H26493 Other secondary cataract, bilateral: Secondary | ICD-10-CM | POA: Diagnosis not present

## 2016-04-05 DIAGNOSIS — Z961 Presence of intraocular lens: Secondary | ICD-10-CM | POA: Diagnosis not present

## 2016-04-06 DIAGNOSIS — E875 Hyperkalemia: Secondary | ICD-10-CM | POA: Diagnosis not present

## 2016-04-06 DIAGNOSIS — E1129 Type 2 diabetes mellitus with other diabetic kidney complication: Secondary | ICD-10-CM | POA: Diagnosis not present

## 2016-04-06 DIAGNOSIS — N2581 Secondary hyperparathyroidism of renal origin: Secondary | ICD-10-CM | POA: Diagnosis not present

## 2016-04-06 DIAGNOSIS — D631 Anemia in chronic kidney disease: Secondary | ICD-10-CM | POA: Diagnosis not present

## 2016-04-06 DIAGNOSIS — N186 End stage renal disease: Secondary | ICD-10-CM | POA: Diagnosis not present

## 2016-04-09 DIAGNOSIS — N2581 Secondary hyperparathyroidism of renal origin: Secondary | ICD-10-CM | POA: Diagnosis not present

## 2016-04-09 DIAGNOSIS — E1129 Type 2 diabetes mellitus with other diabetic kidney complication: Secondary | ICD-10-CM | POA: Diagnosis not present

## 2016-04-09 DIAGNOSIS — N186 End stage renal disease: Secondary | ICD-10-CM | POA: Diagnosis not present

## 2016-04-09 DIAGNOSIS — D631 Anemia in chronic kidney disease: Secondary | ICD-10-CM | POA: Diagnosis not present

## 2016-04-09 DIAGNOSIS — E875 Hyperkalemia: Secondary | ICD-10-CM | POA: Diagnosis not present

## 2016-04-11 DIAGNOSIS — N2581 Secondary hyperparathyroidism of renal origin: Secondary | ICD-10-CM | POA: Diagnosis not present

## 2016-04-11 DIAGNOSIS — Z992 Dependence on renal dialysis: Secondary | ICD-10-CM | POA: Diagnosis not present

## 2016-04-11 DIAGNOSIS — N186 End stage renal disease: Secondary | ICD-10-CM | POA: Diagnosis not present

## 2016-04-11 DIAGNOSIS — I12 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 stage renal disease: Secondary | ICD-10-CM | POA: Diagnosis not present

## 2016-04-11 DIAGNOSIS — D631 Anemia in chronic kidney disease: Secondary | ICD-10-CM | POA: Diagnosis not present

## 2016-04-11 DIAGNOSIS — E875 Hyperkalemia: Secondary | ICD-10-CM | POA: Diagnosis not present

## 2016-04-11 DIAGNOSIS — E1129 Type 2 diabetes mellitus with other diabetic kidney complication: Secondary | ICD-10-CM | POA: Diagnosis not present

## 2016-04-13 DIAGNOSIS — E1129 Type 2 diabetes mellitus with other diabetic kidney complication: Secondary | ICD-10-CM | POA: Diagnosis not present

## 2016-04-13 DIAGNOSIS — N2581 Secondary hyperparathyroidism of renal origin: Secondary | ICD-10-CM | POA: Diagnosis not present

## 2016-04-13 DIAGNOSIS — D631 Anemia in chronic kidney disease: Secondary | ICD-10-CM | POA: Diagnosis not present

## 2016-04-13 DIAGNOSIS — E875 Hyperkalemia: Secondary | ICD-10-CM | POA: Diagnosis not present

## 2016-04-13 DIAGNOSIS — N186 End stage renal disease: Secondary | ICD-10-CM | POA: Diagnosis not present

## 2016-04-16 DIAGNOSIS — E1129 Type 2 diabetes mellitus with other diabetic kidney complication: Secondary | ICD-10-CM | POA: Diagnosis not present

## 2016-04-16 DIAGNOSIS — N2581 Secondary hyperparathyroidism of renal origin: Secondary | ICD-10-CM | POA: Diagnosis not present

## 2016-04-16 DIAGNOSIS — D631 Anemia in chronic kidney disease: Secondary | ICD-10-CM | POA: Diagnosis not present

## 2016-04-16 DIAGNOSIS — E875 Hyperkalemia: Secondary | ICD-10-CM | POA: Diagnosis not present

## 2016-04-16 DIAGNOSIS — N186 End stage renal disease: Secondary | ICD-10-CM | POA: Diagnosis not present

## 2016-04-17 ENCOUNTER — Ambulatory Visit: Payer: Medicare Other | Admitting: Neurology

## 2016-04-18 DIAGNOSIS — E1129 Type 2 diabetes mellitus with other diabetic kidney complication: Secondary | ICD-10-CM | POA: Diagnosis not present

## 2016-04-18 DIAGNOSIS — D631 Anemia in chronic kidney disease: Secondary | ICD-10-CM | POA: Diagnosis not present

## 2016-04-18 DIAGNOSIS — N186 End stage renal disease: Secondary | ICD-10-CM | POA: Diagnosis not present

## 2016-04-18 DIAGNOSIS — N2581 Secondary hyperparathyroidism of renal origin: Secondary | ICD-10-CM | POA: Diagnosis not present

## 2016-04-18 DIAGNOSIS — E875 Hyperkalemia: Secondary | ICD-10-CM | POA: Diagnosis not present

## 2016-04-20 DIAGNOSIS — E1129 Type 2 diabetes mellitus with other diabetic kidney complication: Secondary | ICD-10-CM | POA: Diagnosis not present

## 2016-04-20 DIAGNOSIS — E875 Hyperkalemia: Secondary | ICD-10-CM | POA: Diagnosis not present

## 2016-04-20 DIAGNOSIS — N2581 Secondary hyperparathyroidism of renal origin: Secondary | ICD-10-CM | POA: Diagnosis not present

## 2016-04-20 DIAGNOSIS — N186 End stage renal disease: Secondary | ICD-10-CM | POA: Diagnosis not present

## 2016-04-20 DIAGNOSIS — D631 Anemia in chronic kidney disease: Secondary | ICD-10-CM | POA: Diagnosis not present

## 2016-04-23 DIAGNOSIS — N2581 Secondary hyperparathyroidism of renal origin: Secondary | ICD-10-CM | POA: Diagnosis not present

## 2016-04-23 DIAGNOSIS — E1129 Type 2 diabetes mellitus with other diabetic kidney complication: Secondary | ICD-10-CM | POA: Diagnosis not present

## 2016-04-23 DIAGNOSIS — N186 End stage renal disease: Secondary | ICD-10-CM | POA: Diagnosis not present

## 2016-04-23 DIAGNOSIS — D631 Anemia in chronic kidney disease: Secondary | ICD-10-CM | POA: Diagnosis not present

## 2016-04-23 DIAGNOSIS — E875 Hyperkalemia: Secondary | ICD-10-CM | POA: Diagnosis not present

## 2016-04-25 DIAGNOSIS — N186 End stage renal disease: Secondary | ICD-10-CM | POA: Diagnosis not present

## 2016-04-25 DIAGNOSIS — N2581 Secondary hyperparathyroidism of renal origin: Secondary | ICD-10-CM | POA: Diagnosis not present

## 2016-04-25 DIAGNOSIS — E875 Hyperkalemia: Secondary | ICD-10-CM | POA: Diagnosis not present

## 2016-04-25 DIAGNOSIS — D631 Anemia in chronic kidney disease: Secondary | ICD-10-CM | POA: Diagnosis not present

## 2016-04-25 DIAGNOSIS — E1129 Type 2 diabetes mellitus with other diabetic kidney complication: Secondary | ICD-10-CM | POA: Diagnosis not present

## 2016-04-27 DIAGNOSIS — N2581 Secondary hyperparathyroidism of renal origin: Secondary | ICD-10-CM | POA: Diagnosis not present

## 2016-04-27 DIAGNOSIS — E1129 Type 2 diabetes mellitus with other diabetic kidney complication: Secondary | ICD-10-CM | POA: Diagnosis not present

## 2016-04-27 DIAGNOSIS — D631 Anemia in chronic kidney disease: Secondary | ICD-10-CM | POA: Diagnosis not present

## 2016-04-27 DIAGNOSIS — N186 End stage renal disease: Secondary | ICD-10-CM | POA: Diagnosis not present

## 2016-04-27 DIAGNOSIS — E875 Hyperkalemia: Secondary | ICD-10-CM | POA: Diagnosis not present

## 2016-04-30 DIAGNOSIS — D631 Anemia in chronic kidney disease: Secondary | ICD-10-CM | POA: Diagnosis not present

## 2016-04-30 DIAGNOSIS — E1129 Type 2 diabetes mellitus with other diabetic kidney complication: Secondary | ICD-10-CM | POA: Diagnosis not present

## 2016-04-30 DIAGNOSIS — E875 Hyperkalemia: Secondary | ICD-10-CM | POA: Diagnosis not present

## 2016-04-30 DIAGNOSIS — N2581 Secondary hyperparathyroidism of renal origin: Secondary | ICD-10-CM | POA: Diagnosis not present

## 2016-04-30 DIAGNOSIS — N186 End stage renal disease: Secondary | ICD-10-CM | POA: Diagnosis not present

## 2016-05-02 DIAGNOSIS — N186 End stage renal disease: Secondary | ICD-10-CM | POA: Diagnosis not present

## 2016-05-02 DIAGNOSIS — N2581 Secondary hyperparathyroidism of renal origin: Secondary | ICD-10-CM | POA: Diagnosis not present

## 2016-05-02 DIAGNOSIS — E1129 Type 2 diabetes mellitus with other diabetic kidney complication: Secondary | ICD-10-CM | POA: Diagnosis not present

## 2016-05-02 DIAGNOSIS — D631 Anemia in chronic kidney disease: Secondary | ICD-10-CM | POA: Diagnosis not present

## 2016-05-02 DIAGNOSIS — E875 Hyperkalemia: Secondary | ICD-10-CM | POA: Diagnosis not present

## 2016-05-04 DIAGNOSIS — N2581 Secondary hyperparathyroidism of renal origin: Secondary | ICD-10-CM | POA: Diagnosis not present

## 2016-05-04 DIAGNOSIS — D631 Anemia in chronic kidney disease: Secondary | ICD-10-CM | POA: Diagnosis not present

## 2016-05-04 DIAGNOSIS — E875 Hyperkalemia: Secondary | ICD-10-CM | POA: Diagnosis not present

## 2016-05-04 DIAGNOSIS — N186 End stage renal disease: Secondary | ICD-10-CM | POA: Diagnosis not present

## 2016-05-04 DIAGNOSIS — E1129 Type 2 diabetes mellitus with other diabetic kidney complication: Secondary | ICD-10-CM | POA: Diagnosis not present

## 2016-05-07 DIAGNOSIS — N2581 Secondary hyperparathyroidism of renal origin: Secondary | ICD-10-CM | POA: Diagnosis not present

## 2016-05-07 DIAGNOSIS — E1129 Type 2 diabetes mellitus with other diabetic kidney complication: Secondary | ICD-10-CM | POA: Diagnosis not present

## 2016-05-07 DIAGNOSIS — N186 End stage renal disease: Secondary | ICD-10-CM | POA: Diagnosis not present

## 2016-05-07 DIAGNOSIS — E875 Hyperkalemia: Secondary | ICD-10-CM | POA: Diagnosis not present

## 2016-05-07 DIAGNOSIS — D631 Anemia in chronic kidney disease: Secondary | ICD-10-CM | POA: Diagnosis not present

## 2016-05-08 ENCOUNTER — Ambulatory Visit: Payer: Self-pay | Admitting: Surgery

## 2016-05-08 DIAGNOSIS — N2581 Secondary hyperparathyroidism of renal origin: Secondary | ICD-10-CM | POA: Diagnosis not present

## 2016-05-09 DIAGNOSIS — E1129 Type 2 diabetes mellitus with other diabetic kidney complication: Secondary | ICD-10-CM | POA: Diagnosis not present

## 2016-05-09 DIAGNOSIS — E875 Hyperkalemia: Secondary | ICD-10-CM | POA: Diagnosis not present

## 2016-05-09 DIAGNOSIS — N2581 Secondary hyperparathyroidism of renal origin: Secondary | ICD-10-CM | POA: Diagnosis not present

## 2016-05-09 DIAGNOSIS — D631 Anemia in chronic kidney disease: Secondary | ICD-10-CM | POA: Diagnosis not present

## 2016-05-09 DIAGNOSIS — N186 End stage renal disease: Secondary | ICD-10-CM | POA: Diagnosis not present

## 2016-05-11 DIAGNOSIS — N2581 Secondary hyperparathyroidism of renal origin: Secondary | ICD-10-CM | POA: Diagnosis not present

## 2016-05-11 DIAGNOSIS — D631 Anemia in chronic kidney disease: Secondary | ICD-10-CM | POA: Diagnosis not present

## 2016-05-11 DIAGNOSIS — N186 End stage renal disease: Secondary | ICD-10-CM | POA: Diagnosis not present

## 2016-05-11 DIAGNOSIS — E875 Hyperkalemia: Secondary | ICD-10-CM | POA: Diagnosis not present

## 2016-05-11 DIAGNOSIS — E1129 Type 2 diabetes mellitus with other diabetic kidney complication: Secondary | ICD-10-CM | POA: Diagnosis not present

## 2016-05-12 DIAGNOSIS — Z992 Dependence on renal dialysis: Secondary | ICD-10-CM | POA: Diagnosis not present

## 2016-05-12 DIAGNOSIS — N186 End stage renal disease: Secondary | ICD-10-CM | POA: Diagnosis not present

## 2016-05-12 DIAGNOSIS — I12 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 stage renal disease: Secondary | ICD-10-CM | POA: Diagnosis not present

## 2016-05-14 DIAGNOSIS — D631 Anemia in chronic kidney disease: Secondary | ICD-10-CM | POA: Diagnosis not present

## 2016-05-14 DIAGNOSIS — E875 Hyperkalemia: Secondary | ICD-10-CM | POA: Diagnosis not present

## 2016-05-14 DIAGNOSIS — N186 End stage renal disease: Secondary | ICD-10-CM | POA: Diagnosis not present

## 2016-05-14 DIAGNOSIS — N2581 Secondary hyperparathyroidism of renal origin: Secondary | ICD-10-CM | POA: Diagnosis not present

## 2016-05-14 DIAGNOSIS — E1129 Type 2 diabetes mellitus with other diabetic kidney complication: Secondary | ICD-10-CM | POA: Diagnosis not present

## 2016-05-17 DIAGNOSIS — N186 End stage renal disease: Secondary | ICD-10-CM | POA: Diagnosis not present

## 2016-05-17 DIAGNOSIS — D631 Anemia in chronic kidney disease: Secondary | ICD-10-CM | POA: Diagnosis not present

## 2016-05-17 DIAGNOSIS — E1129 Type 2 diabetes mellitus with other diabetic kidney complication: Secondary | ICD-10-CM | POA: Diagnosis not present

## 2016-05-17 DIAGNOSIS — E875 Hyperkalemia: Secondary | ICD-10-CM | POA: Diagnosis not present

## 2016-05-17 DIAGNOSIS — N2581 Secondary hyperparathyroidism of renal origin: Secondary | ICD-10-CM | POA: Diagnosis not present

## 2016-05-18 DIAGNOSIS — N186 End stage renal disease: Secondary | ICD-10-CM | POA: Diagnosis not present

## 2016-05-18 DIAGNOSIS — N2581 Secondary hyperparathyroidism of renal origin: Secondary | ICD-10-CM | POA: Diagnosis not present

## 2016-05-18 DIAGNOSIS — E1129 Type 2 diabetes mellitus with other diabetic kidney complication: Secondary | ICD-10-CM | POA: Diagnosis not present

## 2016-05-18 DIAGNOSIS — D631 Anemia in chronic kidney disease: Secondary | ICD-10-CM | POA: Diagnosis not present

## 2016-05-18 DIAGNOSIS — E875 Hyperkalemia: Secondary | ICD-10-CM | POA: Diagnosis not present

## 2016-05-21 DIAGNOSIS — N2581 Secondary hyperparathyroidism of renal origin: Secondary | ICD-10-CM | POA: Diagnosis not present

## 2016-05-21 DIAGNOSIS — E1129 Type 2 diabetes mellitus with other diabetic kidney complication: Secondary | ICD-10-CM | POA: Diagnosis not present

## 2016-05-21 DIAGNOSIS — N186 End stage renal disease: Secondary | ICD-10-CM | POA: Diagnosis not present

## 2016-05-21 DIAGNOSIS — E875 Hyperkalemia: Secondary | ICD-10-CM | POA: Diagnosis not present

## 2016-05-21 DIAGNOSIS — D631 Anemia in chronic kidney disease: Secondary | ICD-10-CM | POA: Diagnosis not present

## 2016-05-24 DIAGNOSIS — E1129 Type 2 diabetes mellitus with other diabetic kidney complication: Secondary | ICD-10-CM | POA: Diagnosis not present

## 2016-05-24 DIAGNOSIS — N2581 Secondary hyperparathyroidism of renal origin: Secondary | ICD-10-CM | POA: Diagnosis not present

## 2016-05-24 DIAGNOSIS — E875 Hyperkalemia: Secondary | ICD-10-CM | POA: Diagnosis not present

## 2016-05-24 DIAGNOSIS — D631 Anemia in chronic kidney disease: Secondary | ICD-10-CM | POA: Diagnosis not present

## 2016-05-24 DIAGNOSIS — N186 End stage renal disease: Secondary | ICD-10-CM | POA: Diagnosis not present

## 2016-05-25 DIAGNOSIS — D631 Anemia in chronic kidney disease: Secondary | ICD-10-CM | POA: Diagnosis not present

## 2016-05-25 DIAGNOSIS — N2581 Secondary hyperparathyroidism of renal origin: Secondary | ICD-10-CM | POA: Diagnosis not present

## 2016-05-25 DIAGNOSIS — E875 Hyperkalemia: Secondary | ICD-10-CM | POA: Diagnosis not present

## 2016-05-25 DIAGNOSIS — N186 End stage renal disease: Secondary | ICD-10-CM | POA: Diagnosis not present

## 2016-05-25 DIAGNOSIS — E1129 Type 2 diabetes mellitus with other diabetic kidney complication: Secondary | ICD-10-CM | POA: Diagnosis not present

## 2016-05-28 DIAGNOSIS — N2581 Secondary hyperparathyroidism of renal origin: Secondary | ICD-10-CM | POA: Diagnosis not present

## 2016-05-28 DIAGNOSIS — D631 Anemia in chronic kidney disease: Secondary | ICD-10-CM | POA: Diagnosis not present

## 2016-05-28 DIAGNOSIS — N186 End stage renal disease: Secondary | ICD-10-CM | POA: Diagnosis not present

## 2016-05-28 DIAGNOSIS — E875 Hyperkalemia: Secondary | ICD-10-CM | POA: Diagnosis not present

## 2016-05-28 DIAGNOSIS — E1129 Type 2 diabetes mellitus with other diabetic kidney complication: Secondary | ICD-10-CM | POA: Diagnosis not present

## 2016-05-30 DIAGNOSIS — N186 End stage renal disease: Secondary | ICD-10-CM | POA: Diagnosis not present

## 2016-05-30 DIAGNOSIS — D631 Anemia in chronic kidney disease: Secondary | ICD-10-CM | POA: Diagnosis not present

## 2016-05-30 DIAGNOSIS — E875 Hyperkalemia: Secondary | ICD-10-CM | POA: Diagnosis not present

## 2016-05-30 DIAGNOSIS — N2581 Secondary hyperparathyroidism of renal origin: Secondary | ICD-10-CM | POA: Diagnosis not present

## 2016-05-30 DIAGNOSIS — E1129 Type 2 diabetes mellitus with other diabetic kidney complication: Secondary | ICD-10-CM | POA: Diagnosis not present

## 2016-06-01 DIAGNOSIS — N186 End stage renal disease: Secondary | ICD-10-CM | POA: Diagnosis not present

## 2016-06-01 DIAGNOSIS — E1129 Type 2 diabetes mellitus with other diabetic kidney complication: Secondary | ICD-10-CM | POA: Diagnosis not present

## 2016-06-01 DIAGNOSIS — E875 Hyperkalemia: Secondary | ICD-10-CM | POA: Diagnosis not present

## 2016-06-01 DIAGNOSIS — D631 Anemia in chronic kidney disease: Secondary | ICD-10-CM | POA: Diagnosis not present

## 2016-06-01 DIAGNOSIS — N2581 Secondary hyperparathyroidism of renal origin: Secondary | ICD-10-CM | POA: Diagnosis not present

## 2016-06-04 DIAGNOSIS — E875 Hyperkalemia: Secondary | ICD-10-CM | POA: Diagnosis not present

## 2016-06-04 DIAGNOSIS — N186 End stage renal disease: Secondary | ICD-10-CM | POA: Diagnosis not present

## 2016-06-04 DIAGNOSIS — D631 Anemia in chronic kidney disease: Secondary | ICD-10-CM | POA: Diagnosis not present

## 2016-06-04 DIAGNOSIS — E1129 Type 2 diabetes mellitus with other diabetic kidney complication: Secondary | ICD-10-CM | POA: Diagnosis not present

## 2016-06-04 DIAGNOSIS — N2581 Secondary hyperparathyroidism of renal origin: Secondary | ICD-10-CM | POA: Diagnosis not present

## 2016-06-06 DIAGNOSIS — D631 Anemia in chronic kidney disease: Secondary | ICD-10-CM | POA: Diagnosis not present

## 2016-06-06 DIAGNOSIS — E1129 Type 2 diabetes mellitus with other diabetic kidney complication: Secondary | ICD-10-CM | POA: Diagnosis not present

## 2016-06-06 DIAGNOSIS — N2581 Secondary hyperparathyroidism of renal origin: Secondary | ICD-10-CM | POA: Diagnosis not present

## 2016-06-06 DIAGNOSIS — E875 Hyperkalemia: Secondary | ICD-10-CM | POA: Diagnosis not present

## 2016-06-06 DIAGNOSIS — N186 End stage renal disease: Secondary | ICD-10-CM | POA: Diagnosis not present

## 2016-06-06 NOTE — Pre-Procedure Instructions (Signed)
Carl Found Sr.  06/06/2016      [place], [name] [address] [place] [place] [address] Phone: [phone] Fax: [phone]    Your procedure is scheduled on Tuesday May 1.  Report to [facility] Admitting at 5:30 A.M.  Call this number if you have problems the morning of surgery:  [phone]   Remember:  Do not eat food or drink liquids after midnight.  Take these medicines the morning of surgery with A SIP OF WATER: gabapentin (neurontin), levetiracetam (Keppra), omeprazole (prilosec)  Take all other medications as prescribed except 7 days prior to surgery STOP taking any Aspirin, Aleve, Naproxen, Ibuprofen, Motrin, Advil, Goody's, BC's, all herbal medications, fish oil, and all vitamins    Do not wear jewelry, make-up or nail polish.  Do not wear lotions, powders, or perfumes, or deoderant.  Do not shave 48 hours prior to surgery.  Men may shave face and neck.  Do not bring valuables to the hospital.  [facility] is not responsible for any belongings or valuables.  Contacts, dentures or bridgework may not be worn into surgery.  Leave your suitcase in the car.  After surgery it may be brought to your room.  For patients admitted to the hospital, discharge time will be determined by your treatment team.  Patients discharged the day of surgery will not be allowed to drive home.    Special instructions:    [place]- Preparing For Surgery  Before surgery, you can play an important role. Because skin is not sterile, your skin needs to be as free of germs as possible. You can reduce the number of germs on your skin by washing with CHG (chlorahexidine gluconate) Soap before surgery.  CHG is an antiseptic cleaner which kills germs and bonds with the skin to continue killing germs even after washing.  Please do not use if you have an allergy to CHG or antibacterial soaps. If your skin becomes  reddened/irritated stop using the CHG.  Do not shave (including legs and underarms) for at least 48 hours prior to first CHG shower. It is OK to shave your face.  Please follow these instructions carefully.   1. Shower the NIGHT BEFORE SURGERY and the MORNING OF SURGERY with CHG.   2. If you chose to wash your hair, wash your hair first as usual with your normal shampoo.  3. After you shampoo, rinse your hair and body thoroughly to remove the shampoo.  4. Use CHG as you would any other liquid soap. You can apply CHG directly to the skin and wash gently with a scrungie or a clean washcloth.   5. Apply the CHG Soap to your body ONLY FROM THE NECK DOWN.  Do not use on open wounds or open sores. Avoid contact with your eyes, ears, mouth and genitals (private parts). Wash genitals (private parts) with your normal soap.  6. Wash thoroughly, paying special attention to the area where your surgery will be performed.  7. Thoroughly rinse your body with warm water from the neck down.  8. DO NOT shower/wash with your normal soap after using and rinsing off the CHG Soap.  9. Pat yourself dry with a CLEAN TOWEL.   10. Wear CLEAN PAJAMAS   11. Place CLEAN SHEETS on your bed the night of your first shower and DO NOT SLEEP WITH PETS.    Day of Surgery: Do not apply any deodorants/lotions. Please wear clean clothes  to the hospital/surgery center.      Please read over the following fact sheets that you were given. MRSA Information

## 2016-06-07 ENCOUNTER — Encounter (HOSPITAL_COMMUNITY): Payer: Self-pay

## 2016-06-07 ENCOUNTER — Encounter (HOSPITAL_COMMUNITY)
Admission: RE | Admit: 2016-06-07 | Discharge: 2016-06-07 | Disposition: A | Discharge status: Home / Self Care | Payer: Medicare Other | Source: Ambulatory Visit | Attending: Surgery | Admitting: Surgery

## 2016-06-07 DIAGNOSIS — Z01812 Encounter for preprocedural laboratory examination: Secondary | ICD-10-CM | POA: Diagnosis not present

## 2016-06-07 DIAGNOSIS — N2581 Secondary hyperparathyroidism of renal origin: Secondary | ICD-10-CM | POA: Insufficient documentation

## 2016-06-07 HISTORY — DX: Unspecified osteoarthritis, unspecified site: M19.90

## 2016-06-07 HISTORY — DX: Unspecified convulsions: R56.9

## 2016-06-07 LAB — BASIC METABOLIC PANEL
Anion gap: 14 (ref 5–15)
BUN: 27 mg/dL — ABNORMAL HIGH (ref 6–20)
CO2: 26 mmol/L (ref 22–32)
Calcium: 10.3 mg/dL (ref 8.9–10.3)
Chloride: 95 mmol/L — ABNORMAL LOW (ref 101–111)
Creatinine, Ser: 7.93 mg/dL — ABNORMAL HIGH (ref 0.61–1.24)
GFR calc Af Amer: 8 mL/min — ABNORMAL LOW (ref >60)
GFR calc non Af Amer: 7 mL/min — ABNORMAL LOW (ref >60)
Glucose, Bld: 91 mg/dL (ref 65–99)
Potassium: 3.9 mmol/L (ref 3.5–5.1)
Sodium: 135 mmol/L (ref 135–145)

## 2016-06-07 LAB — CBC
HCT: 36.9 % — ABNORMAL LOW (ref 39.0–52.0)
Hemoglobin: 11.6 g/dL — ABNORMAL LOW (ref 13.0–17.0)
MCH: 26.7 pg (ref 26.0–34.0)
MCHC: 31.4 g/dL (ref 30.0–36.0)
MCV: 85 fL (ref 78.0–100.0)
Platelets: 301 10*3/uL (ref 150–400)
RBC: 4.34 MIL/uL (ref 4.22–5.81)
RDW: 20.2 % — ABNORMAL HIGH (ref 11.5–15.5)
WBC: 6.4 10*3/uL (ref 4.0–10.5)

## 2016-06-07 MED ORDER — CHLORHEXIDINE GLUCONATE CLOTH 2 % EX PADS: 6.0000 | MEDICATED_PAD | Freq: Once | CUTANEOUS | Status: DC

## 2016-06-07 NOTE — Progress Notes (Addendum)
PCP: Tye Savoy (pt states she manages his kidneys) Pt denies cardiologist, cardiac hx or cardiologist.   Pt is on HD M/W/F and access is right AV fistula. Will place order for istat4 DOS Pt has OSA but does not wear CPAP  EKG: 12/03/15  Pt denies any SOB, chest pain or signs of infection at PAT appointment.   Pt states he does not want whole blood products unless he is "going to die"

## 2016-06-08 DIAGNOSIS — E1129 Type 2 diabetes mellitus with other diabetic kidney complication: Secondary | ICD-10-CM | POA: Diagnosis not present

## 2016-06-08 DIAGNOSIS — E875 Hyperkalemia: Secondary | ICD-10-CM | POA: Diagnosis not present

## 2016-06-08 DIAGNOSIS — D631 Anemia in chronic kidney disease: Secondary | ICD-10-CM | POA: Diagnosis not present

## 2016-06-08 DIAGNOSIS — N2581 Secondary hyperparathyroidism of renal origin: Secondary | ICD-10-CM | POA: Diagnosis not present

## 2016-06-08 DIAGNOSIS — N186 End stage renal disease: Secondary | ICD-10-CM | POA: Diagnosis not present

## 2016-06-08 NOTE — Progress Notes (Signed)
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  - PCP is Jamal Maes, MD - Neurologist is Rosalin Hawking, MD  PMH includes: HTN, stroke, OSA, seizures, ESRD on hemodialysis, anemia. Former smoker. BMI 26. S/p laparoscopic R colectomy 08/05/15.  Medications include: ASA 81 mg, Lipitor, Prilosec  Preoperative labs reviewed. Renal function consistent with ESRD.  CXR 10/01/15: No acute cardiopulmonary disease. Aortic atherosclerosis. Cardiomegaly without congestive failure.  EKG 12/02/15: Sinus rhythm. Low voltage precordial leads.  Carotid duplex 12/03/15: Bilateral: intimal wall thickening CCA. Mild soft plaque origin ICA. 1-39% ICA plaquing. Vertebral artery flow is antegrade.  Echo 10/03/15: - Left ventricle: The cavity size was normal. Wall thickness was increased in a pattern of mild LVH. There was moderate focal basal hypertrophy of the septum. Systolic function was normal. The estimated ejection fraction was in the range of 60% to 65%. Wall motion was normal; there were no regional wall motion abnormalities. Doppler parameters are consistent with abnormal left ventricular relaxation (grade 1 diastolic dysfunction). The E/e&' ratio is <8, suggesting normal LV filling pressure. - Left atrium: The atrium was mildly dilated. - Right atrium: The atrium was mildly dilated. - Inferior vena cava: The vessel was normal in size. The respirophasic diameter changes were in the normal range (>= 50%), consistent with normal central venous pressure.  Stress echo 01/14/14 (care everywhere): Negative dobutamine echocardiography for inducible ischemia at target heart rate.  If no changes, I anticipate pt can proceed with surgery as scheduled.   Willeen Cass, FNP-BC [facility]Anesthesiology Phone: [phone] 06/08/2016 2:22 PM

## 2016-06-10 ENCOUNTER — Encounter (HOSPITAL_COMMUNITY): Payer: Self-pay | Admitting: Surgery

## 2016-06-10 NOTE — H&P (Signed)
General Surgery [facility]  Forbes Loll Sr DOB: 10-17-1960 Single / Language: [name] / Race: Black or African American Male   History of Present Illness  The patient is a 56 year old male who presents with secondary hyperparathyroidism.  Patient is referred by Dr. Jamal Maes for evaluation and management of secondary hyperparathyroidism due to end-stage renal disease. Patient has been on hemodialysis for approximately 9 years. He dialyzes on Mondays Wednesdays and Fridays at the [facility] facility. He has a fistula in the right forearm. Recent laboratory levels show a intact PTH level of 679, calcium level of 8.3, phosphorus level of 8.0, and a calcium times phosphorous product of 66. Patient has had no prior surgery on the head or neck. He is currently attempting to be listed on the transplant list at [facility]. Patient has been taking Sensipar 90 mg daily. He has had no specific complications from his secondary hyperparathyroidism. Patient presents today to discuss total parathyroidectomy with autotransplantation.   Allergies  Diclofenac *ANALGESICS - ANTI-INFLAMMATORY*  Hydrocodone-Acetaminophen *ANALGESICS - OPIOID*  Iron Dextran *CHEMICALS*  OxyCODONE HCl *ANALGESICS - OPIOID*   Medication History Sensipar (90MG  Tablet, Oral) Active. Gabapentin (100MG  Capsule, Oral) Active. Atorvastatin Calcium (10MG  Tablet, Oral) Active. Calcium Acetate (Phos Binder) (667MG  Capsule, Oral) Active. Folic Acid-Vit K8-JGO T15 (0.4-50-0.1MG  Tablet, Oral) Active. Lanthanum Carbonate (1000MG  Packet, Oral) Active. Sorbitol (70% Solution,) Active. Colace (100MG  Capsule, Oral) Active. Aspirin (81MG  Tablet, Oral) Active. Medications Reconciled  Vitals Weight: 200.2 lb Height: 73in Body Surface Area: 2.15 m Body Mass Index: 26.41 kg/m  Temp.: 97.59F  Pulse: 80 (Regular)  BP: 108/62 (Sitting, Left Arm,  Standard)  Physical Exam  The physical exam findings are as follows: Note:General - appears comfortable, no distress; not diaphorectic  HEENT - normocephalic; sclerae clear, gaze conjugate; mucous membranes moist, dentition good; voice normal  Neck - symmetric on extension; no palpable anterior or posterior cervical adenopathy; no palpable masses in the thyroid bed  Chest - clear bilaterally without rhonchi, rales, or wheeze  Cor - regular rhythm with normal rate; no significant murmur  Ext - non-tender without significant edema or lymphedema; patent fistula right forearm; previous surgical access procedure left forearm, not currently active  Neuro - grossly intact; no tremor    Assessment & Plan   SECONDARY HYPERPARATHYROIDISM, RENAL (N25.81)  Patient presents on referral from his nephrologist for evaluation for total parathyroidectomy with autotransplantation to the forearm for management of secondary hyperparathyroidism due to end-stage renal disease. Patient is currently being evaluated for transplantation at [facility]. He wishes to proceed with parathyroid surgery in the near future.  The patient and I discussed a surgical operation. We discussed the location of the incisions. We discussed the hospital stay to be anticipated. We discussed potential complications including injury to recurrent laryngeal nerves and the possibility of not being able to identify all 4 parathyroid glands. He understands and wishes to proceed in the near future.  The risks and benefits of the procedure have been discussed at length with the patient. The patient understands the proposed procedure, potential alternative treatments, and the course of recovery to be expected. All of the patient's questions have been answered at this time. The patient wishes to proceed with surgery.  [name], MD, [facility] Office: [phone]

## 2016-06-11 DIAGNOSIS — N2581 Secondary hyperparathyroidism of renal origin: Secondary | ICD-10-CM | POA: Diagnosis not present

## 2016-06-11 DIAGNOSIS — N186 End stage renal disease: Secondary | ICD-10-CM | POA: Diagnosis not present

## 2016-06-11 DIAGNOSIS — I12 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 stage renal disease: Secondary | ICD-10-CM | POA: Diagnosis not present

## 2016-06-11 DIAGNOSIS — E1129 Type 2 diabetes mellitus with other diabetic kidney complication: Secondary | ICD-10-CM | POA: Diagnosis not present

## 2016-06-11 DIAGNOSIS — D631 Anemia in chronic kidney disease: Secondary | ICD-10-CM | POA: Diagnosis not present

## 2016-06-11 DIAGNOSIS — Z992 Dependence on renal dialysis: Secondary | ICD-10-CM | POA: Diagnosis not present

## 2016-06-11 DIAGNOSIS — E875 Hyperkalemia: Secondary | ICD-10-CM | POA: Diagnosis not present

## 2016-06-11 NOTE — Anesthesia Preprocedure Evaluation (Addendum)
Anesthesia Evaluation  Patient identified by MRN, date of birth, ID band Patient awake    Reviewed: Allergy & Precautions, H&P , NPO status , Patient's Chart, lab work & pertinent test results  History of Anesthesia Complications Negative for: history of anesthetic complications  Airway Mallampati: III  TM Distance: >3 FB Neck ROM: Full    Dental no notable dental hx. (+) Dental Advisory Given, Partial Upper   Pulmonary sleep apnea and Continuous Positive Airway Pressure Ventilation , former smoker,    Pulmonary exam normal        Cardiovascular hypertension, Pt. on medications Normal cardiovascular exam     Neuro/Psych  Headaches, CVA negative psych ROS   GI/Hepatic Neg liver ROS, GERD  Medicated and Controlled,  Endo/Other  negative endocrine ROS  Renal/GU ESRF and DialysisRenal disease  negative genitourinary   Musculoskeletal   Abdominal   Peds  Hematology negative hematology ROS (+) anemia ,   Anesthesia Other Findings   Reproductive/Obstetrics negative OB ROS                            Anesthesia Physical  Anesthesia Plan  ASA: III  Anesthesia Plan: General   Post-op Pain Management:    Induction: Intravenous  Airway Management Planned: Oral ETT  Additional Equipment:   Intra-op Plan:   Post-operative Plan: Extubation in OR  Informed Consent: I have reviewed the patients History and Physical, chart, labs and discussed the procedure including the risks, benefits and alternatives for the proposed anesthesia with the patient or authorized representative who has indicated his/her understanding and acceptance.   Dental advisory given  Plan Discussed with: Anesthesiologist and CRNA  Anesthesia Plan Comments:        Anesthesia Quick Evaluation

## 2016-06-12 ENCOUNTER — Encounter (HOSPITAL_COMMUNITY)
Admission: RE | Disposition: A | Discharge status: Home / Self Care | Payer: Self-pay | Source: Ambulatory Visit | Attending: Surgery

## 2016-06-12 ENCOUNTER — Inpatient Hospital Stay (HOSPITAL_COMMUNITY)
Admission: RE | Admit: 2016-06-12 | Discharge: 2016-06-14 | DRG: 674 | Disposition: A | Discharge status: Home / Self Care | Payer: Medicare Other | Source: Ambulatory Visit | Attending: Surgery | Admitting: Surgery

## 2016-06-12 ENCOUNTER — Inpatient Hospital Stay (HOSPITAL_COMMUNITY): Payer: Medicare Other | Admitting: Emergency Medicine

## 2016-06-12 ENCOUNTER — Ambulatory Visit: Payer: Medicare Other | Admitting: Neurology

## 2016-06-12 ENCOUNTER — Inpatient Hospital Stay (HOSPITAL_COMMUNITY): Payer: Medicare Other | Admitting: Certified Registered Nurse Anesthetist

## 2016-06-12 ENCOUNTER — Encounter (HOSPITAL_COMMUNITY): Payer: Self-pay | Admitting: *Deleted

## 2016-06-12 DIAGNOSIS — Z992 Dependence on renal dialysis: Secondary | ICD-10-CM | POA: Diagnosis not present

## 2016-06-12 DIAGNOSIS — N186 End stage renal disease: Secondary | ICD-10-CM | POA: Diagnosis present

## 2016-06-12 DIAGNOSIS — G4730 Sleep apnea, unspecified: Secondary | ICD-10-CM | POA: Diagnosis present

## 2016-06-12 DIAGNOSIS — I12 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 stage renal disease: Secondary | ICD-10-CM | POA: Diagnosis present

## 2016-06-12 DIAGNOSIS — Z7982 Long term (current) use of aspirin: Secondary | ICD-10-CM | POA: Diagnosis not present

## 2016-06-12 DIAGNOSIS — E213 Hyperparathyroidism, unspecified: Secondary | ICD-10-CM | POA: Diagnosis not present

## 2016-06-12 DIAGNOSIS — Z8673 Personal history of transient ischemic attack (TIA), and cerebral infarction without residual deficits: Secondary | ICD-10-CM

## 2016-06-12 DIAGNOSIS — N2581 Secondary hyperparathyroidism of renal origin: Principal | ICD-10-CM | POA: Diagnosis present

## 2016-06-12 DIAGNOSIS — K219 Gastro-esophageal reflux disease without esophagitis: Secondary | ICD-10-CM | POA: Diagnosis present

## 2016-06-12 DIAGNOSIS — D631 Anemia in chronic kidney disease: Secondary | ICD-10-CM | POA: Diagnosis not present

## 2016-06-12 HISTORY — DX: Secondary hyperparathyroidism of renal origin: N25.81

## 2016-06-12 HISTORY — PX: PARATHYROIDECTOMY: SHX19

## 2016-06-12 LAB — RENAL FUNCTION PANEL
Albumin: 2.8 g/dL — ABNORMAL LOW (ref 3.5–5.0)
Anion gap: 14 (ref 5–15)
BUN: 47 mg/dL — ABNORMAL HIGH (ref 6–20)
CO2: 22 mmol/L (ref 22–32)
Calcium: 8.7 mg/dL — ABNORMAL LOW (ref 8.9–10.3)
Chloride: 100 mmol/L — ABNORMAL LOW (ref 101–111)
Creatinine, Ser: 9.62 mg/dL — ABNORMAL HIGH (ref 0.61–1.24)
GFR calc Af Amer: 6 mL/min — ABNORMAL LOW (ref >60)
GFR calc non Af Amer: 5 mL/min — ABNORMAL LOW (ref >60)
Glucose, Bld: 174 mg/dL — ABNORMAL HIGH (ref 65–99)
Phosphorus: 6.4 mg/dL — ABNORMAL HIGH (ref 2.5–4.6)
Potassium: 5.1 mmol/L (ref 3.5–5.1)
Sodium: 136 mmol/L (ref 135–145)

## 2016-06-12 LAB — POCT I-STAT 4, (NA,K, GLUC, HGB,HCT)
Glucose, Bld: 83 mg/dL (ref 65–99)
HCT: 33 % — ABNORMAL LOW (ref 39.0–52.0)
Hemoglobin: 11.2 g/dL — ABNORMAL LOW (ref 13.0–17.0)
Potassium: 4.5 mmol/L (ref 3.5–5.1)
Sodium: 139 mmol/L (ref 135–145)

## 2016-06-12 SURGERY — PARATHYROIDECTOMY, WITH AUTOGRAFT TRANSPLANT
Anesthesia: General | Site: Neck

## 2016-06-12 MED ORDER — ACETAMINOPHEN 325 MG PO TABS: 650.0000 mg | ORAL_TABLET | Freq: Four times a day (QID) | ORAL | Status: DC | PRN

## 2016-06-12 MED ORDER — DOXERCALCIFEROL 4 MCG/2ML IV SOLN
4.0000 ug | INTRAVENOUS | Status: DC
Start: 2016-06-13 — End: 2016-06-14
  Administered 2016-06-13: 4 ug via INTRAVENOUS

## 2016-06-12 MED ORDER — DEXAMETHASONE SODIUM PHOSPHATE 10 MG/ML IJ SOLN
INTRAMUSCULAR | Status: DC | PRN
  Administered 2016-06-12: 10 mg via INTRAVENOUS

## 2016-06-12 MED ORDER — PROPOFOL 10 MG/ML IV BOLUS
INTRAVENOUS | Status: DC | PRN
  Administered 2016-06-12: 170 mg via INTRAVENOUS

## 2016-06-12 MED ORDER — 0.9 % SODIUM CHLORIDE (POUR BTL) OPTIME
TOPICAL | Status: DC | PRN
  Administered 2016-06-12 (×2): 1000 mL

## 2016-06-12 MED ORDER — HYDROMORPHONE HCL 1 MG/ML IJ SOLN
INTRAMUSCULAR | Status: AC
  Filled 2016-06-12: qty 0.5

## 2016-06-12 MED ORDER — LIDOCAINE 2% (20 MG/ML) 5 ML SYRINGE
INTRAMUSCULAR | Status: AC
  Filled 2016-06-12: qty 5

## 2016-06-12 MED ORDER — DARBEPOETIN ALFA 60 MCG/0.3ML IJ SOSY
60.0000 ug | PREFILLED_SYRINGE | INTRAMUSCULAR | Status: DC
  Administered 2016-06-13: 60 ug via INTRAVENOUS
  Filled 2016-06-12: qty 0.3

## 2016-06-12 MED ORDER — FENTANYL CITRATE (PF) 250 MCG/5ML IJ SOLN
INTRAMUSCULAR | Status: AC
  Filled 2016-06-12: qty 5

## 2016-06-12 MED ORDER — ROCURONIUM BROMIDE 100 MG/10ML IV SOLN
INTRAVENOUS | Status: DC | PRN
  Administered 2016-06-12: 60 mg via INTRAVENOUS

## 2016-06-12 MED ORDER — GLYCOPYRROLATE 0.2 MG/ML IJ SOLN
INTRAMUSCULAR | Status: DC | PRN
  Administered 2016-06-12: 0.4 mg via INTRAVENOUS

## 2016-06-12 MED ORDER — HYDROMORPHONE HCL 1 MG/ML IJ SOLN
1.0000 mg | INTRAMUSCULAR | Status: DC | PRN
  Administered 2016-06-12 – 2016-06-14 (×7): 1 mg via INTRAVENOUS
  Filled 2016-06-12 (×8): qty 1

## 2016-06-12 MED ORDER — HEMOSTATIC AGENTS (NO CHARGE) OPTIME
TOPICAL | Status: DC | PRN
  Administered 2016-06-12: 1 via TOPICAL

## 2016-06-12 MED ORDER — ONDANSETRON HCL 4 MG/2ML IJ SOLN
INTRAMUSCULAR | Status: AC
  Filled 2016-06-12: qty 2

## 2016-06-12 MED ORDER — SODIUM CHLORIDE 0.9 % IV SOLN
125.0000 mg | INTRAVENOUS | Status: DC
  Administered 2016-06-13: 125 mg via INTRAVENOUS
  Filled 2016-06-12 (×2): qty 10

## 2016-06-12 MED ORDER — NEOSTIGMINE METHYLSULFATE 10 MG/10ML IV SOLN
INTRAVENOUS | Status: DC | PRN
  Administered 2016-06-12: 3 mg via INTRAVENOUS

## 2016-06-12 MED ORDER — NEOSTIGMINE METHYLSULFATE 5 MG/5ML IV SOSY
PREFILLED_SYRINGE | INTRAVENOUS | Status: AC
  Filled 2016-06-12: qty 5

## 2016-06-12 MED ORDER — ROCURONIUM BROMIDE 10 MG/ML (PF) SYRINGE
PREFILLED_SYRINGE | INTRAVENOUS | Status: AC
  Filled 2016-06-12: qty 5

## 2016-06-12 MED ORDER — ONDANSETRON HCL 4 MG/2ML IJ SOLN: 4.0000 mg | Freq: Four times a day (QID) | INTRAMUSCULAR | Status: DC | PRN

## 2016-06-12 MED ORDER — ONDANSETRON 4 MG PO TBDP: 4.0000 mg | ORAL_TABLET | Freq: Four times a day (QID) | ORAL | Status: DC | PRN

## 2016-06-12 MED ORDER — ACETAMINOPHEN 650 MG RE SUPP: 650.0000 mg | Freq: Four times a day (QID) | RECTAL | Status: DC | PRN

## 2016-06-12 MED ORDER — HYDROMORPHONE HCL 1 MG/ML IJ SOLN
0.2500 mg | INTRAMUSCULAR | Status: DC | PRN
  Administered 2016-06-12 (×2): 0.5 mg via INTRAVENOUS

## 2016-06-12 MED ORDER — LANTHANUM CARBONATE 500 MG PO CHEW: 1000.0000 mg | CHEWABLE_TABLET | Freq: Three times a day (TID) | ORAL | Status: DC

## 2016-06-12 MED ORDER — LIDOCAINE HCL 4 % EX SOLN
CUTANEOUS | Status: DC | PRN
  Administered 2016-06-12: 4 mL via TOPICAL

## 2016-06-12 MED ORDER — ONDANSETRON HCL 4 MG/2ML IJ SOLN
INTRAMUSCULAR | Status: DC | PRN
  Administered 2016-06-12: 4 mg via INTRAVENOUS

## 2016-06-12 MED ORDER — MIDAZOLAM HCL 2 MG/2ML IJ SOLN
INTRAMUSCULAR | Status: AC
  Filled 2016-06-12: qty 2

## 2016-06-12 MED ORDER — GABAPENTIN 100 MG PO CAPS
100.0000 mg | ORAL_CAPSULE | Freq: Two times a day (BID) | ORAL | Status: DC
  Administered 2016-06-12 – 2016-06-14 (×5): 100 mg via ORAL
  Filled 2016-06-12 (×5): qty 1

## 2016-06-12 MED ORDER — PROPOFOL 10 MG/ML IV BOLUS
INTRAVENOUS | Status: AC
  Filled 2016-06-12: qty 20

## 2016-06-12 MED ORDER — LACTATED RINGERS IV SOLN
INTRAVENOUS | Status: DC | PRN
  Administered 2016-06-12: 07:00:00 via INTRAVENOUS

## 2016-06-12 MED ORDER — OXYCODONE HCL 5 MG PO TABS: 5.0000 mg | ORAL_TABLET | ORAL | Status: DC | PRN

## 2016-06-12 MED ORDER — CALCIUM CARBONATE ANTACID 1250 MG/5ML PO SUSP
750.0000 mg | Freq: Three times a day (TID) | ORAL | Status: DC
  Administered 2016-06-12 – 2016-06-13 (×2): 750 mg via ORAL
  Filled 2016-06-12 (×4): qty 10

## 2016-06-12 MED ORDER — LIDOCAINE HCL (CARDIAC) 20 MG/ML IV SOLN
INTRAVENOUS | Status: DC | PRN
  Administered 2016-06-12: 80 mg via INTRAVENOUS

## 2016-06-12 MED ORDER — MIDAZOLAM HCL 5 MG/5ML IJ SOLN
INTRAMUSCULAR | Status: DC | PRN
  Administered 2016-06-12: 2 mg via INTRAVENOUS

## 2016-06-12 MED ORDER — PHENYLEPHRINE 40 MCG/ML (10ML) SYRINGE FOR IV PUSH (FOR BLOOD PRESSURE SUPPORT)
PREFILLED_SYRINGE | INTRAVENOUS | Status: AC
  Filled 2016-06-12: qty 10

## 2016-06-12 MED ORDER — HYDROMORPHONE HCL 1 MG/ML IJ SOLN
INTRAMUSCULAR | Status: AC
  Administered 2016-06-12: 0.5 mg via INTRAVENOUS
  Filled 2016-06-12: qty 0.5

## 2016-06-12 MED ORDER — PHENYLEPHRINE HCL 10 MG/ML IJ SOLN
INTRAVENOUS | Status: DC | PRN
  Administered 2016-06-12: 20 ug/min via INTRAVENOUS

## 2016-06-12 MED ORDER — DEXAMETHASONE SODIUM PHOSPHATE 10 MG/ML IJ SOLN
INTRAMUSCULAR | Status: AC
  Filled 2016-06-12: qty 1

## 2016-06-12 MED ORDER — PROMETHAZINE HCL 25 MG/ML IJ SOLN: 6.2500 mg | INTRAMUSCULAR | Status: DC | PRN

## 2016-06-12 MED ORDER — PHENYLEPHRINE HCL 10 MG/ML IJ SOLN
INTRAMUSCULAR | Status: AC
  Filled 2016-06-12: qty 1

## 2016-06-12 MED ORDER — SODIUM CHLORIDE 0.9 % IV SOLN
INTRAVENOUS | Status: DC
  Administered 2016-06-12: 17:00:00 via INTRAVENOUS

## 2016-06-12 MED ORDER — FENTANYL CITRATE (PF) 100 MCG/2ML IJ SOLN
INTRAMUSCULAR | Status: DC | PRN
  Administered 2016-06-12: 100 ug via INTRAVENOUS
  Administered 2016-06-12: 25 ug via INTRAVENOUS
  Administered 2016-06-12: 50 ug via INTRAVENOUS
  Administered 2016-06-12: 25 ug via INTRAVENOUS

## 2016-06-12 MED ORDER — PHENYLEPHRINE HCL 10 MG/ML IJ SOLN
INTRAMUSCULAR | Status: DC | PRN
  Administered 2016-06-12: 120 ug via INTRAVENOUS

## 2016-06-12 MED ORDER — CEFAZOLIN SODIUM-DEXTROSE 2-4 GM/100ML-% IV SOLN
2.0000 g | INTRAVENOUS | Status: AC
  Administered 2016-06-12: 2 g via INTRAVENOUS

## 2016-06-12 SURGICAL SUPPLY — 55 items
ATTRACTOMAT 16X20 MAGNETIC DRP (DRAPES) ×2 IMPLANT
BANDAGE ACE 4X5 VEL STRL LF (GAUZE/BANDAGES/DRESSINGS) ×2 IMPLANT
BLADE CLIPPER SURG (BLADE) IMPLANT
BLADE SURG 10 STRL SS (BLADE) ×2 IMPLANT
BLADE SURG 15 STRL LF DISP TIS (BLADE) ×2 IMPLANT
BLADE SURG 15 STRL SS (BLADE) ×4
BNDG GAUZE ELAST 4 BULKY (GAUZE/BANDAGES/DRESSINGS) ×2 IMPLANT
CANISTER SUCT 3000ML PPV (MISCELLANEOUS) ×2 IMPLANT
CHLORAPREP W/TINT 10.5 ML (MISCELLANEOUS) ×4 IMPLANT
CLIP TI MEDIUM 24 (CLIP) ×2 IMPLANT
CLIP TI WIDE RED SMALL 24 (CLIP) ×2 IMPLANT
CONT SPEC 4OZ CLIKSEAL STRL BL (MISCELLANEOUS) ×16 IMPLANT
COVER SURGICAL LIGHT HANDLE (MISCELLANEOUS) ×2 IMPLANT
CRADLE DONUT ADULT HEAD (MISCELLANEOUS) ×2 IMPLANT
DRAPE LAPAROTOMY 100X72 PEDS (DRAPES) ×4 IMPLANT
DRAPE SLUSH MACHINE 52X66 (DRAPES) IMPLANT
DRAPE SLUSH/WARMER DISC (DRAPES) IMPLANT
DRAPE UTILITY XL STRL (DRAPES) ×8 IMPLANT
ELECT CAUTERY BLADE 6.4 (BLADE) ×2 IMPLANT
ELECT REM PT RETURN 9FT ADLT (ELECTROSURGICAL) ×2
ELECTRODE REM PT RTRN 9FT ADLT (ELECTROSURGICAL) ×1 IMPLANT
GAUZE SPONGE 4X4 12PLY STRL (GAUZE/BANDAGES/DRESSINGS) ×3 IMPLANT
GAUZE SPONGE 4X4 16PLY XRAY LF (GAUZE/BANDAGES/DRESSINGS) ×2 IMPLANT
GLOVE BIOGEL PI IND STRL 7.0 (GLOVE) IMPLANT
GLOVE BIOGEL PI INDICATOR 7.0 (GLOVE) ×1
GLOVE ECLIPSE 6.5 STRL STRAW (GLOVE) ×1 IMPLANT
GLOVE ECLIPSE 7.0 STRL STRAW (GLOVE) ×1 IMPLANT
GLOVE SURG ORTHO 8.0 STRL STRW (GLOVE) ×4 IMPLANT
GOWN STRL REUS W/ TWL LRG LVL3 (GOWN DISPOSABLE) ×2 IMPLANT
GOWN STRL REUS W/ TWL XL LVL3 (GOWN DISPOSABLE) ×1 IMPLANT
GOWN STRL REUS W/TWL LRG LVL3 (GOWN DISPOSABLE) ×4
GOWN STRL REUS W/TWL XL LVL3 (GOWN DISPOSABLE) ×2
HEMOSTAT SURGICEL 2X4 FIBR (HEMOSTASIS) ×2 IMPLANT
ILLUMINATOR WAVEGUIDE N/F (MISCELLANEOUS) ×1 IMPLANT
IV SODIUM CHL 0.9 SLUSH (IV SOLUTION) ×2 IMPLANT
KIT BASIN OR (CUSTOM PROCEDURE TRAY) ×2 IMPLANT
KIT ROOM TURNOVER OR (KITS) ×2 IMPLANT
NS IRRIG 1000ML POUR BTL (IV SOLUTION) ×2 IMPLANT
PACK SURGICAL SETUP 50X90 (CUSTOM PROCEDURE TRAY) ×2 IMPLANT
PAD ARMBOARD 7.5X6 YLW CONV (MISCELLANEOUS) ×2 IMPLANT
PENCIL BUTTON HOLSTER BLD 10FT (ELECTRODE) ×2 IMPLANT
SPECIMEN JAR SMALL (MISCELLANEOUS) ×8 IMPLANT
SPONGE INTESTINAL PEANUT (DISPOSABLE) ×2 IMPLANT
STRIP CLOSURE SKIN 1/2X4 (GAUZE/BANDAGES/DRESSINGS) ×4 IMPLANT
SUT MNCRL AB 4-0 PS2 18 (SUTURE) ×4 IMPLANT
SUT PROLENE 4 0 RB 1 (SUTURE) ×2
SUT PROLENE 4-0 RB1 .5 CRCL 36 (SUTURE) ×1 IMPLANT
SUT SILK 2 0 (SUTURE) ×2
SUT SILK 2-0 18XBRD TIE 12 (SUTURE) ×1 IMPLANT
SUT VIC AB 3-0 SH 18 (SUTURE) ×5 IMPLANT
SYR BULB 3OZ (MISCELLANEOUS) ×2 IMPLANT
TOWEL OR 17X24 6PK STRL BLUE (TOWEL DISPOSABLE) ×2 IMPLANT
TOWEL OR 17X26 10 PK STRL BLUE (TOWEL DISPOSABLE) ×2 IMPLANT
TUBE CONNECTING 12X1/4 (SUCTIONS) ×2 IMPLANT
UNDERPAD 30X30 (UNDERPADS AND DIAPERS) ×2 IMPLANT

## 2016-06-12 NOTE — Progress Notes (Addendum)
Pt states he is allergic oxycodone and that his BP "drops".  Will alert MD Gerkin    17 Alerted MD Gerkin of situation.     Paulla Fore, RN.

## 2016-06-12 NOTE — Transfer of Care (Signed)
Immediate Anesthesia Transfer of Care Note  Patient: [name].  Procedure(s) Performed: Procedure(s): TOTAL PARATHYROIDECTOMY WITH AUTOTRANSPLANTATION TO LEFT FOREARM (N/A)  Patient Location: PACU  Anesthesia Type:General  Level of Consciousness: awake, alert , oriented and patient cooperative  Airway & Oxygen Therapy: Patient Spontanous Breathing and Patient connected to nasal cannula oxygen  Post-op Assessment: Report given to RN and Post -op Vital signs reviewed and stable  Post vital signs: Reviewed and stable  Last Vitals:  Vitals:   06/12/16 0631 06/12/16 1039  BP: 116/76   Pulse: 83   Resp: 20   Temp: 36.8 C 36.7 C    Last Pain:  Vitals:   06/12/16 0631  TempSrc: Oral      Patients Stated Pain Goal: 4 (43/14/27 6701)  Complications: No apparent anesthesia complications

## 2016-06-12 NOTE — Brief Op Note (Signed)
06/12/2016  10:37 AM  PATIENT:  Carl Found Sr.  56 y.o. male  PRE-OPERATIVE DIAGNOSIS:  Secondary hyperparathyroidism of renal origin  POST-OPERATIVE DIAGNOSIS:  Secondary hyperparathyroidism of renal origin  PROCEDURE:  Procedure(s): TOTAL PARATHYROIDECTOMY WITH AUTOTRANSPLANTATION TO LEFT FOREARM (N/A)  SURGEON:  Surgeon(s) and Role:    * Armandina Gemma, MD - Primary  ANESTHESIA:   general  EBL:  Total I/O In: 500 [I.V.:500] Out: -   BLOOD ADMINISTERED:none  DRAINS: none   LOCAL MEDICATIONS USED:  NONE  SPECIMEN:  Excision  DISPOSITION OF SPECIMEN:  PATHOLOGY  COUNTS:  YES  TOURNIQUET:  * No tourniquets in log *  DICTATION: .Other Dictation: Dictation Number [id number]  PLAN OF CARE: Admit to inpatient   PATIENT DISPOSITION:  PACU - hemodynamically stable.   Delay start of Pharmacological VTE agent (>24hrs) due to surgical blood loss or risk of bleeding: yes  Carl Regal, MD, [facility] Office: [phone]

## 2016-06-12 NOTE — Anesthesia Postprocedure Evaluation (Addendum)
Anesthesia Post Note  Patient: Kieffer Blatz Sr.  Procedure(s) Performed: Procedure(s) (LRB): TOTAL PARATHYROIDECTOMY WITH AUTOTRANSPLANTATION TO LEFT FOREARM (N/A)  Patient location during evaluation: PACU Anesthesia Type: General Level of consciousness: sedated Pain management: pain level controlled Vital Signs Assessment: post-procedure vital signs reviewed and stable Respiratory status: spontaneous breathing and respiratory function stable Cardiovascular status: stable Anesthetic complications: no       Last Vitals:  Vitals:   06/12/16 1139 06/12/16 1218  BP: 126/76 133/65  Pulse: 78 86  Resp: (!) 8 14  Temp:      Last Pain:  Vitals:   06/12/16 1130  TempSrc:   PainSc: 6                  Nehemiah Montee DANIEL

## 2016-06-12 NOTE — Consult Note (Signed)
[facility] Renal Consultation Note    Indication for Consultation:  Management of ESRD/hemodialysis; anemia, hypertension/volume and secondary hyperparathyroidism PCP: No PCP on file Nephrologist: Dr. Lorrene Reid  HPI: Carl Deleo Sr. is a 56 y.o. male with ESRD on hemodialysis since 07/2006 2/2 HTN vs ANCA. Patient has hemodialysis MWF at [facility] MWF. Patient is Jehovah Witness. Does not USUALLY receive blood. PMH significant for HTN, small right parieto-occipital lobe infarct., diverticulitis, LGIB, anemia of chronic disease, SHPT.   Patient was admitted today per Dr. Catalina Antigua for total parathyroidectomy. Patient has had PHT levels as high as 1149, has been on high dose hectorol and max dose sensipar without control of PTH levels. Per Dr. Harlow Asa, they were able to remove 3 of 4 lobes parathyroid with autotransplantation to L. Forearm.   Currently patient is awake,alert, oriented without C/O pain. Denies any other issues, No C/O fever, chills, malaise, night sweats, chest pain, palpitations, SOB, DOE, orthopnea, abdominal pain, bloody or tarry stools, CVAT, hematuria, dysuria, headaches, dizziness, vertigo, vision or hearing changes.   Last hemodialysis 06/11/16. Signed off 35 minutes early (usually runs full treatments), left 0.5 kg under EDW. Anemia labs have been stable. BMD labs as noted above.   Past Medical History:  Diagnosis Date  . Anemia   . Arthritis   . Chronic headaches   . Chronic kidney disease    Chronic Renal Failure; On Renal Transplant List, ESRD on HD  . Diverticulitis   . GI bleed   . Hemodialysis patient ([place])    M-W-F  . Hypertension   . Presence of arteriovenous fistula for hemodialysis, primary ([place])    RUE  . Refusal of blood product    NO WHOLE BLOOD PROUCTS  . Secondary hyperparathyroidism ([place])   . Seizures ([place])    one episode in past  . Sleep apnea    doesn't use CPAP anymore  . Stroke [facility])    no residual    Past Surgical History:  Procedure Laterality Date  . AV FISTULA PLACEMENT Right    right arm  . COLONOSCOPY N/A 08/04/2015   Procedure: COLONOSCOPY;  Surgeon: Jerene Bears, MD;  Location: [facility] ENDOSCOPY;  Service: Endoscopy;  Laterality: N/A;  . graft left arm Left    for dialysis x 2  . INSERTION OF DIALYSIS CATHETER     Rt chest  . LAPAROSCOPIC RIGHT COLECTOMY N/A 08/05/2015   Procedure: LAPAROSCOPIC RIGHT COLECTOMY- ASCENDING;  Surgeon: Stark Klein, MD;  Location: [facility];  Service: General;  Laterality: N/A;  . PARATHYROIDECTOMY  06/12/2016   Family History  Problem Relation Age of Onset  . Diabetes Father   . Stroke Father   . Hypertension Father   . Uterine cancer Mother   . Lupus Sister   . Stroke Sister   . Anuerysm Brother     brain  . Hypertension Sister    Social History:  reports that he quit smoking about 26 years ago. He has never used smokeless tobacco. He reports that he drinks about 0.6 oz of alcohol per week . He reports that he does not use drugs. Allergies  Allergen Reactions  . Infed [Iron Dextran] Other (See Comments)    Decreased BP   . Oxycodone Nausea Only  . Vicodin [Hydrocodone-Acetaminophen] Other (See Comments)    Decreased BP  . Diclofenac Other (See Comments)    Unknown  . Whole Blood Other (See Comments)    Patient refuses whole blood "unless I am going to  die"   Prior to Admission medications   Medication Sig Start Date End Date Taking? Authorizing Provider  aspirin EC 81 MG tablet Take 1 tablet (81 mg total) by mouth daily. 08/26/14  Yes Rosalin Hawking, MD  atorvastatin (LIPITOR) 10 MG tablet Take 1 tablet (10 mg total) by mouth daily at 6 PM. 05/25/14  Yes Thurnell Lose, MD  augmented betamethasone dipropionate (DIPROLENE-AF) 0.05 % ointment Apply 1 application topically as needed (for scalp).   Yes Historical Provider, MD  calcium acetate (PHOSLO) 667 MG capsule Take 2,001-3,335 mg by mouth 3 (three) times daily with meals.    Yes  Historical Provider, MD  folic acid-vitamin b complex-vitamin c-selenium-zinc (DIALYVITE) 3 MG TABS tablet Take 1 tablet by mouth daily.   Yes Historical Provider, MD  gabapentin (NEURONTIN) 100 MG capsule Take 100 mg by mouth 2 (two) times daily.  09/29/14  Yes Historical Provider, MD  ibuprofen (ADVIL,MOTRIN) 800 MG tablet Take 800 mg by mouth 3 (three) times daily as needed for mild pain.  10/14/15  Yes Historical Provider, MD  lanthanum (FOSRENOL) 1000 MG chewable tablet Chew 1,000 mg by mouth 3 (three) times daily with meals.   Yes Historical Provider, MD  butalbital-acetaminophen-caffeine (FIORICET, ESGIC) 50-325-40 MG tablet Take 1 tablet by mouth every 12 (twelve) hours as needed for headache. No more than 2 tabs a day and no more than 5 tabs a week. Patient not taking: Reported on 06/07/2016 01/17/16   Garvin Fila, MD  docusate sodium (COLACE) 100 MG capsule Take 1 capsule (100 mg total) by mouth 2 (two) times daily as needed for mild constipation. 08/10/15   Lavina Hamman, MD  levETIRAcetam (KEPPRA) 750 MG tablet Take 1 tablet (750 mg total) by mouth 2 (two) times daily. Patient not taking: Reported on 06/07/2016 12/03/15   Maryellen Pile, MD  omeprazole (PRILOSEC) 20 MG capsule Take 1 capsule (20 mg total) by mouth daily. 30-60 mins before breakfast. Patient taking differently: Take 20 mg by mouth daily as needed (for acid reflux). 30-60 mins before breakfast. 12/15/15   Levin Erp, PA  ondansetron (ZOFRAN) 4 MG tablet Take 1 tablet (4 mg total) by mouth every 6 (six) hours. Patient taking differently: Take 4 mg by mouth every 6 (six) hours as needed for nausea or vomiting.  09/10/15   Vira Blanco, MD  sorbitol 70 % solution Take 15-60 mLs by mouth daily as needed ((do not take on/before dialysis days)).     Historical Provider, MD   Current Facility-Administered Medications  Medication Dose Route Frequency Provider Last Rate Last Dose  . 0.9 %  sodium chloride infusion    Intravenous Continuous Armandina Gemma, MD      . acetaminophen (TYLENOL) tablet 650 mg  650 mg Oral Q6H PRN Armandina Gemma, MD       Or  . acetaminophen (TYLENOL) suppository 650 mg  650 mg Rectal Q6H PRN Armandina Gemma, MD      . gabapentin (NEURONTIN) capsule 100 mg  100 mg Oral BID Armandina Gemma, MD      . HYDROmorphone (DILAUDID) 1 MG/ML injection           . HYDROmorphone (DILAUDID) injection 1 mg  1 mg Intravenous Q2H PRN Armandina Gemma, MD      . lanthanum [facility]) chewable tablet 1,000 mg  1,000 mg Oral TID WC Armandina Gemma, MD      . ondansetron (ZOFRAN-ODT) disintegrating tablet 4 mg  4 mg Oral Q6H PRN Armandina Gemma,  MD       Or  . ondansetron (ZOFRAN) injection 4 mg  4 mg Intravenous Q6H PRN Armandina Gemma, MD      . oxyCODONE (Oxy IR/ROXICODONE) immediate release tablet 5-10 mg  5-10 mg Oral Q4H PRN Armandina Gemma, MD       Labs: Basic Metabolic Panel:  Recent Labs Lab 06/07/16 0827 06/12/16 0607  NA 135 139  K 3.9 4.5  CL 95*  --   CO2 26  --   GLUCOSE 91 83  BUN 27*  --   CREATININE 7.93*  --   CALCIUM 10.3  --    Liver Function Tests: No results for input(s): AST, ALT, ALKPHOS, BILITOT, PROT, ALBUMIN in the last 168 hours. No results for input(s): LIPASE, AMYLASE in the last 168 hours. No results for input(s): AMMONIA in the last 168 hours. CBC:  Recent Labs Lab 06/07/16 0827 06/12/16 0607  WBC 6.4  --   HGB 11.6* 11.2*  HCT 36.9* 33.0*  MCV 85.0  --   PLT 301  --    Cardiac Enzymes: No results for input(s): CKTOTAL, CKMB, CKMBINDEX, TROPONINI in the last 168 hours. CBG: No results for input(s): GLUCAP in the last 168 hours. Iron Studies: No results for input(s): IRON, TIBC, TRANSFERRIN, FERRITIN in the last 72 hours. Studies/Results: No results found.  ROS: As per HPI otherwise negative.   Physical Exam: Vitals:   06/12/16 1108 06/12/16 1124 06/12/16 1139 06/12/16 1218  BP: 136/76 122/72 126/76 133/65  Pulse: 86 92 78 86  Resp: 12 13 (!) 8 14  Temp:       TempSrc:      SpO2: 99% 100% 100% 98%  Weight:         General: Well developed, well nourished, in no acute distress. Head: Normocephalic, atraumatic, sclera non-icteric, mucus membranes are moist Neck: Supple. JVD not elevated. Drsg neck CDI.  Lungs: Clear bilaterally to auscultation without wheezes, rales, or rhonchi. Breathing is unlabored. Heart: RRR with S1 S2. No murmurs, rubs, or gallops appreciated. Abdomen: Soft, non-tender, non-distended with normoactive bowel sounds. No rebound/guarding. No obvious abdominal masses. M-S:  Strength and tone appear normal for age. Lower extremities:without edema or ischemic changes, no open wounds  Neuro: Alert and oriented X 3. Moves all extremities spontaneously. Psych:  Responds to questions appropriately with a normal affect. Dialysis Access: RFA AVF Aneurysmal, + bruit.   Dialysis Orders: MWF [place] 4 hr 15 min 180 NRe  86 kg 1.0 K+ /2.0 Ca  500/Auto 1.5 -Heparin: 2700 units IV TIW -Hectorol 8 mcg IV TIW (last PTH 451 06/06/16) -Mircera 150 mcg IV q 2 weeks (last dose 05/30/16 HGB 10.9 06/06/16. Had recent Fe load  for Fe 04/09-04/27/18. Last Fe 40 Tsat 19% Ferritin 1375 06/06/16)  BMD meds:  Fosrenol 1000 mg 1 tab PO TID AC 1 tab w/snack  Calcium Acetate 667 mg 5 capsules TID AC  (Ca 9.3 C Ca 9.7 Phos 6.8 06/06/16)  Assessment/Plan: 1.  Hyperparathyroidism renal origin/S/P total parathyroidectomy: Removed 3 lobes but didn't find 4th lobe. Unsure how much calcium will fall. Will recheck renal function q 6 hours  2.  ESRD -  MWF. HD tomorrow on Schedule. Rechecking labs. Has been using 1.0 K bath at center. Hold heparin  3.  Hypertension/volume  - No antihypertensive meds on OP med list. Usually hypotensive with HD. Wt 86.6 kg, will UF to OP EDW.  4.  Anemia  - HGB 11.2. Give reduced dose ESA tomorrow  with HD on schedule. Load with Fe X 3 doses as patient is Jehovah Witness and do not want to allow HGB to start to fall.  5.  Metabolic  bone disease - Follow labs. Holding binders/VDRA until lab results available.  6.  Nutrition - Clear liquids at present. Advance to renal diet/renal vit/nepro.  Rita H. Owens Shark, NP-C 06/12/2016, 1:04 PM  [employer] [phone]  Pt seen, examined, agree w assess/plan as above with additions as indicated. ESRD pt with sec HPTH and PTH levels > 1000, but more recently ~500 range on high-doses of calcimimetics.  Admitted after subtotal parathyroidectomy with forearm implant.  Watch serum Ca closely, starting tid CaCO3 po and cont VDRA IV w/ HD tiw.  Kelly Splinter MD [employer] pager [phone]    cell [phone] 06/12/2016, 4:24 PM

## 2016-06-12 NOTE — Interval H&P Note (Signed)
History and Physical Interval Note:  06/12/2016 7:04 AM  Carl Found Sr.  has presented today for surgery, with the diagnosis of Secondary hyperparathyroidism of renal origin.  The various methods of treatment have been discussed with the patient and family. After consideration of risks, benefits and other options for treatment, the patient has consented to    Procedure(s): TOTAL PARATHYROIDECTOMY WITH AUTOTRANSPLANTATION TO LEFT FOREARM (N/A) as a surgical intervention .    The patient's history has been reviewed, patient examined, no change in status, stable for surgery.  I have reviewed the patient's chart and labs.  Questions were answered to the patient's satisfaction.    [name], MD, [facility] Office: [place]

## 2016-06-12 NOTE — Progress Notes (Signed)
New Admission Note:   Arrival Method: stretcher from PACU  Mental Orientation: alert and oriented x4  Telemetry:n/a  Assessment: Completed Skin: see flow sheet IV: left FA Pain: denies  Tubes: n/a Safety Measures: Safety Fall Prevention Plan has been given, discussed and signed Admission: Completed [facility] Orientation: Patient has been orientated to the room, unit and staff.  Family: none at bedside   Orders have been reviewed and implemented. Will continue to monitor the patient. Call light has been placed within reach and bed alarm has been activated.   Emilio Math, RN [facility]  Phone number: [phone]

## 2016-06-13 ENCOUNTER — Encounter (HOSPITAL_COMMUNITY): Payer: Self-pay | Admitting: Surgery

## 2016-06-13 LAB — RENAL FUNCTION PANEL
Albumin: 2.6 g/dL — ABNORMAL LOW (ref 3.5–5.0)
Albumin: 2.5 g/dL — ABNORMAL LOW (ref 3.5–5.0)
Albumin: 2.6 g/dL — ABNORMAL LOW (ref 3.5–5.0)
Anion gap: 13 (ref 5–15)
Anion gap: 13 (ref 5–15)
Anion gap: 14 (ref 5–15)
BUN: 53 mg/dL — ABNORMAL HIGH (ref 6–20)
BUN: 58 mg/dL — ABNORMAL HIGH (ref 6–20)
BUN: 58 mg/dL — ABNORMAL HIGH (ref 6–20)
CO2: 23 mmol/L (ref 22–32)
CO2: 24 mmol/L (ref 22–32)
CO2: 25 mmol/L (ref 22–32)
Calcium: 8.6 mg/dL — ABNORMAL LOW (ref 8.9–10.3)
Calcium: 9 mg/dL (ref 8.9–10.3)
Calcium: 9 mg/dL (ref 8.9–10.3)
Chloride: 99 mmol/L — ABNORMAL LOW (ref 101–111)
Chloride: 97 mmol/L — ABNORMAL LOW (ref 101–111)
Chloride: 98 mmol/L — ABNORMAL LOW (ref 101–111)
Creatinine, Ser: 10.13 mg/dL — ABNORMAL HIGH (ref 0.61–1.24)
Creatinine, Ser: 11.05 mg/dL — ABNORMAL HIGH (ref 0.61–1.24)
Creatinine, Ser: 11.07 mg/dL — ABNORMAL HIGH (ref 0.61–1.24)
GFR calc Af Amer: 6 mL/min — ABNORMAL LOW (ref >60)
GFR calc Af Amer: 5 mL/min — ABNORMAL LOW (ref >60)
GFR calc Af Amer: 5 mL/min — ABNORMAL LOW (ref >60)
GFR calc non Af Amer: 5 mL/min — ABNORMAL LOW (ref >60)
GFR calc non Af Amer: 4 mL/min — ABNORMAL LOW (ref >60)
GFR calc non Af Amer: 4 mL/min — ABNORMAL LOW (ref >60)
Glucose, Bld: 117 mg/dL — ABNORMAL HIGH (ref 65–99)
Glucose, Bld: 88 mg/dL (ref 65–99)
Glucose, Bld: 89 mg/dL (ref 65–99)
Phosphorus: 6.9 mg/dL — ABNORMAL HIGH (ref 2.5–4.6)
Phosphorus: 8 mg/dL — ABNORMAL HIGH (ref 2.5–4.6)
Phosphorus: 8 mg/dL — ABNORMAL HIGH (ref 2.5–4.6)
Potassium: 4.4 mmol/L (ref 3.5–5.1)
Potassium: 4.6 mmol/L (ref 3.5–5.1)
Potassium: 4.7 mmol/L (ref 3.5–5.1)
Sodium: 135 mmol/L (ref 135–145)
Sodium: 135 mmol/L (ref 135–145)
Sodium: 136 mmol/L (ref 135–145)

## 2016-06-13 LAB — CBC
HCT: 30 % — ABNORMAL LOW (ref 39.0–52.0)
HCT: 31 % — ABNORMAL LOW (ref 39.0–52.0)
Hemoglobin: 9.4 g/dL — ABNORMAL LOW (ref 13.0–17.0)
Hemoglobin: 9.8 g/dL — ABNORMAL LOW (ref 13.0–17.0)
MCH: 26.5 pg (ref 26.0–34.0)
MCH: 26.6 pg (ref 26.0–34.0)
MCHC: 31.3 g/dL (ref 30.0–36.0)
MCHC: 31.6 g/dL (ref 30.0–36.0)
MCV: 84.5 fL (ref 78.0–100.0)
MCV: 84.2 fL (ref 78.0–100.0)
Platelets: 245 10*3/uL (ref 150–400)
Platelets: 250 10*3/uL (ref 150–400)
RBC: 3.55 MIL/uL — ABNORMAL LOW (ref 4.22–5.81)
RBC: 3.68 MIL/uL — ABNORMAL LOW (ref 4.22–5.81)
RDW: 19.5 % — ABNORMAL HIGH (ref 11.5–15.5)
RDW: 19.4 % — ABNORMAL HIGH (ref 11.5–15.5)
WBC: 7.2 10*3/uL (ref 4.0–10.5)
WBC: 6.8 10*3/uL (ref 4.0–10.5)

## 2016-06-13 LAB — MRSA PCR SCREENING: MRSA by PCR: NEGATIVE

## 2016-06-13 MED ORDER — ALTEPLASE 2 MG IJ SOLR: 2.0000 mg | Freq: Once | INTRAMUSCULAR | Status: DC | PRN

## 2016-06-13 MED ORDER — SODIUM CHLORIDE 0.9 % IV SOLN: 100.0000 mL | INTRAVENOUS | Status: DC | PRN

## 2016-06-13 MED ORDER — DOXERCALCIFEROL 4 MCG/2ML IV SOLN
INTRAVENOUS | Status: AC
  Administered 2016-06-13: 4 ug via INTRAVENOUS
  Filled 2016-06-13: qty 2

## 2016-06-13 MED ORDER — LIDOCAINE HCL (PF) 1 % IJ SOLN
5.0000 mL | INTRAMUSCULAR | Status: DC | PRN
  Filled 2016-06-13: qty 5

## 2016-06-13 MED ORDER — PENTAFLUOROPROP-TETRAFLUOROETH EX AERO: 1.0000 "application " | INHALATION_SPRAY | CUTANEOUS | Status: DC | PRN

## 2016-06-13 MED ORDER — CALCIUM CARBONATE ANTACID 500 MG PO CHEW
400.0000 mg | CHEWABLE_TABLET | Freq: Three times a day (TID) | ORAL | Status: DC
  Administered 2016-06-13 – 2016-06-14 (×2): 400 mg via ORAL
  Filled 2016-06-13 (×2): qty 2

## 2016-06-13 MED ORDER — LIDOCAINE-PRILOCAINE 2.5-2.5 % EX CREA: 1.0000 "application " | TOPICAL_CREAM | CUTANEOUS | Status: DC | PRN

## 2016-06-13 MED ORDER — DARBEPOETIN ALFA 60 MCG/0.3ML IJ SOSY
PREFILLED_SYRINGE | INTRAMUSCULAR | Status: AC
  Administered 2016-06-13: 60 ug via INTRAVENOUS
  Filled 2016-06-13: qty 0.3

## 2016-06-13 MED ORDER — OXYCODONE HCL 5 MG PO TABS: 5.0000 mg | ORAL_TABLET | ORAL | 0 refills | Status: DC | PRN

## 2016-06-13 MED ORDER — HEPARIN SODIUM (PORCINE) 1000 UNIT/ML DIALYSIS: 1000.0000 [IU] | INTRAMUSCULAR | Status: DC | PRN

## 2016-06-13 NOTE — Op Note (Signed)
[name], [name] NO.:  [id number]  MEDICAL RECORD NO.:  [id number]  LOCATION:  PERIO                        FACILITY:  [facility]  PHYSICIAN:  Carl Regal, MD      DATE OF BIRTH:  Oct 17, 1960  DATE OF PROCEDURE:  06/12/2016                              OPERATIVE REPORT   PREOPERATIVE DIAGNOSIS:  Secondary hyperparathyroidism, end-stage renal disease.  POSTOPERATIVE DIAGNOSIS:  Secondary hyperparathyroidism, end-stage renal disease.  PROCEDURES: 1. Neck exploration with parathyroidectomy (three glands). 2. Autotransplantation, parathyroid tissue, left brachioradialis     muscle.  SURGEON:  Carl Regal, MD  ANESTHESIA:  General, per Dr. Tedra [name].  ESTIMATED BLOOD LOSS:  Minimal.  PREPARATION:  ChloraPrep.  COMPLICATIONS:  None.  INDICATIONS:  The patient is a 56 year old male, referred by Dr. Jamal [name] for management of secondary hyperparathyroidism.  The patient has been on hemodialysis for 9 years.  He has a fistula on the right forearm.  Laboratory level showed an intact PTH level of 679, a calcium level of 8.3, and a phosphorus level of 8.0.  The patient has been on Sensipar.  He now comes to Surgery for parathyroidectomy with autotransplantation for management of secondary hyperparathyroidism.  BODY OF REPORT:  Procedure was done in OR #2 at the [place].  The patient was brought to the operating room, placed in supine position on the operating room table.  Following administration of general anesthesia, the patient was positioned and then prepped and draped in the usual aseptic fashion.  After ascertaining that an adequate level of anesthesia had been achieved, a Kocher incision was made with a #15 blade.  Dissection was carried through the subcutaneous tissues and platysma.  Hemostasis was achieved with electrocautery.  Skin flaps were elevated cephalad and caudad.  A Mahorner retractor was placed for  exposure.  Strap muscles were incised in the midline and dissection was begun on the left side.  The left thyroid lobe was exposed by reflecting the strap muscles laterally. Left lobe is rolled anteriorly.  Venous tributaries are divided between Ligaclips with the electrocautery.  A mildly enlarged parathyroid gland is identified at the midlevel of the thyroid gland adjacent to the inferior thyroid artery.  It is gently mobilized.  Vascular pedicle is divided between Ligaclips and the gland is excised.  Frozen section biopsy was submitted and confirms parathyroid tissue.  The remainder of the gland is placed in iced saline on the back table.  Further dissection in the left neck allows for complete mobilization of the left thyroid lobe.  Exploration is carried posteriorly to the precervical fascia.  Inferiorly, the thyrothymic tract is opened and there is an ectopic parathyroid gland located just beneath the left clavicle in the thyroid thymic tract.  It is moderately enlarged.  It is dissected out and resected dividing its vascular pedicle between Ligaclips.  A biopsy confirms parathyroid tissue.  The remainder of the gland is placed in iced saline on the back table.  Dry pack is placed in the left neck.  Next, we turned our attention to the right side.  Again, the right thyroid lobe is exposed by reflecting the strap muscles laterally. Venous tributaries  are divided between [place].  Right lobe is mobilized and rolled anteriorly.  Exploration reveals an enlarged parathyroid gland just above the level of the inferior thyroid artery, located posteriorly adjacent to the lateral edge of the esophagus.  This is gently mobilized with blunt dissection.  Vascular pedicle is divided between Ligaclips and the gland is excised.  Biopsy confirms parathyroid tissue.  Remainder of the gland is placed in iced saline on the back table.  Further dissection is carried out in an attempt to find what  is suspected to be a right inferior parathyroid gland.  The entire thyroid lobe is completely mobilized.  The inferior thyroid artery is preserved. Superior pole vessels are actually taken down in order to fully mobilize the superior pole and search for a superior parathyroid.  None is identified.  The dissection is carried back to the precervical fascia and indeed the retroesophageal space is explored without evidence of ectopic parathyroid gland.  The inferior pole of the thyroid gland is inspected closely.  The thyrothymic tract is opened and dissected out and contains only adipose tissue.  No evidence of a second parathyroid gland is identified after a thorough exploration of the right neck. Decision is made not to proceed with right thyroid lobectomy.  The neck is irrigated with warm saline.  Hemostasis is achieved. Fibrillar is placed throughout the operative field.  Strap muscles are reapproximated in the midline with interrupted 3-0 Vicryl sutures. Platysma is closed with interrupted 3-0 Vicryl sutures.  Skin is closed with a running 4-0 Monocryl subcuticular suture.  Wound is washed and dried and Steri-Strips are applied.  Sterile dressings are applied.  Next, the left forearm is placed on an armboard.  It is prepped and draped in the usual aseptic fashion.  After doing a second time-out and ensuring that adequate level of anesthesia had been maintained, an incision is made with a #15 blade over the left brachioradialis muscle. Skin flaps are elevated circumferentially and a Weitlaner retractor placed for exposure.  The excised parathyroid glands are then sampled with 1 mm fragments of tissue being obtained in iced saline.  These 1-mm fragments of tissue are then implanted into the left brachioradialis muscle by incising the muscle fascia, using a mosquito hemostat to create a muscular pocket, inserting a 1 mm fragment of parathyroid tissue, and closing the overlying muscle  fascia with interrupted 4-0 Prolene suture.  This exercise is repeated eight times into the left brachioradialis muscle.  Good hemostasis is noted.  Subcutaneous tissues are closed with interrupted 3-0 Vicryl sutures.  Skin is closed with a running 4-0 Monocryl subcuticular suture.  Steri-Strips are applied.  Sterile dressings are applied.  The patient is awakened from anesthesia and brought to the recovery room.  The patient tolerated the procedure well.    Carl Regal, MD, [facility] Office: [phone]   TMG/MEDQ  D:  06/12/2016  T:  06/13/2016  Job:  [id number]  cc:   Elzie Rings. Lorrene Reid, M.D.

## 2016-06-13 NOTE — Progress Notes (Signed)
General Surgery [facility]  Assessment & Plan:  Status post neck exploration and parathyroidectomy (3 glands removed), autotransplantation  Minimal fall in calcium level overnight  Patient in HD this AM  Pain controlled with Dilaudid - encouraged po pain Rx  Home when stable from renal standpoint  Will monitor calcium and phos and PTH levels post op.  Likely has 4th gland which was not identified at surgery, possibly ectopic.  Will plan sestamibi scan in 3 months if levels remain elevated.  May require additional surgery.  Discussed with patient this AM.        [name], MD, [facility]       Office: [phone]    Subjective: Patient in HD.  Pain controlled. Mild hoarseness.  Hungry.  Objective: Vital signs in last 24 hours: Temp:  [97.6 F (36.4 C)-98.1 F (36.7 C)] 97.6 F (36.4 C) (05/02 0705) Pulse Rate:  [67-97] 73 (05/02 0730) Resp:  [8-16] 16 (05/02 0705) BP: (122-144)/(65-85) 136/78 (05/02 0730) SpO2:  [98 %-100 %] 99 % (05/02 0705) Weight:  [88.2 kg (194 lb 6.4 oz)-89.5 kg (197 lb 5 oz)] 89.5 kg (197 lb 5 oz) (05/02 0705) Last BM Date: 06/12/16  Intake/Output from previous day: 05/01 0701 - 05/02 0700 In: 1277.5 [P.O.:600; I.V.:677.5] Out: 30 [Blood:30] Intake/Output this shift: No intake/output data recorded.  Physical Exam: HEENT - sclerae clear, mucous membranes moist Neck - wound dry and intact, minimal STS; mild hoarseness Chest - clear bilaterally Cor - RRR Ext - no edema, non-tender; dressing left forearm dry and intact Neuro - alert & oriented, no focal deficits  Lab Results:   Recent Labs  06/13/16 0201 06/13/16 0650  WBC 7.2 6.8  HGB 9.4* 9.8*  HCT 30.0* 31.0*  PLT 245 250   BMET  Recent Labs  06/13/16 0201 06/13/16 0650  NA 135 135  136  K 4.4 4.6  4.7  CL 99* 97*  98*  CO2 23 24  25   GLUCOSE 117* 89  88  BUN 53* 58*  58*  CREATININE 10.13* 11.05*  11.07*   CALCIUM 8.6* 9.0  9.0   PT/INR No results for input(s): LABPROT, INR in the last 72 hours. Comprehensive Metabolic Panel:    Component Value Date/Time   NA 136 06/13/2016 0650   NA 135 06/13/2016 0650   K 4.7 06/13/2016 0650   K 4.6 06/13/2016 0650   CL 98 (L) 06/13/2016 0650   CL 97 (L) 06/13/2016 0650   CO2 25 06/13/2016 0650   CO2 24 06/13/2016 0650   BUN 58 (H) 06/13/2016 0650   BUN 58 (H) 06/13/2016 0650   CREATININE 11.07 (H) 06/13/2016 0650   CREATININE 11.05 (H) 06/13/2016 0650   CREATININE 11.39 (HH) 12/09/2012 1221   GLUCOSE 88 06/13/2016 0650   GLUCOSE 89 06/13/2016 0650   CALCIUM 9.0 06/13/2016 0650   CALCIUM 9.0 06/13/2016 0650   CALCIUM 8.0 (L) 08/02/2006 1133   AST 14 (L) 12/02/2015 0626   AST 12 (L) 10/03/2015 0623   ALT 10 (L) 12/02/2015 0626   ALT 9 (L) 10/03/2015 0623   ALKPHOS 65 12/02/2015 0626   ALKPHOS 62 10/03/2015 0623   BILITOT 0.3 12/02/2015 0626   BILITOT 0.6 10/03/2015 0623   PROT 7.6 12/02/2015 0626   PROT 7.7 10/03/2015 0623   ALBUMIN 2.5 (L) 06/13/2016 0650   ALBUMIN 2.6 (L) 06/13/2016 0650    Studies/Results: No results found.  Ayomide Purdy M 06/13/2016  Patient ID: Carl Found Sr., male   DOB: October 29, 1960, 56 y.o.   MRN: [id number]

## 2016-06-13 NOTE — Procedures (Signed)
Serum Ca 8.6 overnight and 9.0 this am (8.7 pre-op).  Per surg notes only 3 of 4 glands found, may have ectopic 4th gland, which probably explains why no big drop in Ca levels post-op.  Also his last PTH was 450.  Will dc on low dose CaCO3 supplement and get weekly Ca/ P levels and periodic PTH.  Gen surg to f/u in op setting as well.  OK for dc from renal standpoint.     I was present at this dialysis session, have reviewed the session itself and made  appropriate changes Kelly Splinter MD [place] pager [phone]   06/13/2016, 10:20 AM

## 2016-06-14 LAB — RENAL FUNCTION PANEL
Albumin: 2.6 g/dL — ABNORMAL LOW (ref 3.5–5.0)
Anion gap: 12 (ref 5–15)
BUN: 38 mg/dL — ABNORMAL HIGH (ref 6–20)
CO2: 26 mmol/L (ref 22–32)
Calcium: 9 mg/dL (ref 8.9–10.3)
Chloride: 96 mmol/L — ABNORMAL LOW (ref 101–111)
Creatinine, Ser: 7.98 mg/dL — ABNORMAL HIGH (ref 0.61–1.24)
GFR calc Af Amer: 8 mL/min — ABNORMAL LOW (ref >60)
GFR calc non Af Amer: 7 mL/min — ABNORMAL LOW (ref >60)
Glucose, Bld: 72 mg/dL (ref 65–99)
Phosphorus: 6.9 mg/dL — ABNORMAL HIGH (ref 2.5–4.6)
Potassium: 4.7 mmol/L (ref 3.5–5.1)
Sodium: 134 mmol/L — ABNORMAL LOW (ref 135–145)

## 2016-06-14 LAB — PARATHYROID HORMONE, INTACT (NO CA): PTH: 273 pg/mL — ABNORMAL HIGH (ref 15–65)

## 2016-06-14 NOTE — Progress Notes (Signed)
Pt given discharge instructions and all questions answered. Family will be transporting patient home.

## 2016-06-14 NOTE — Discharge Summary (Signed)
Physician Discharge Summary [facility]  Patient ID: Carl Found Sr. MRN: [id number] DOB/AGE: 1960-07-01 56 y.o.  Admit date: 06/12/2016 Discharge date: 06/14/2016  Admission Diagnoses:  Secondary hyperparathyroidism, ESRD  Discharge Diagnoses:  Principal Problem:   Secondary hyperparathyroidism ([place]) Active Problems:   ESRD on dialysis ([place])   End stage renal disease ([place])   Secondary hyperparathyroidism of renal origin [facility])   Discharged Condition: good  Hospital Course: Patient was admitted for observation following parathyroid surgery.  Post op course was uncomplicated.  Pain was well controlled.  Tolerated diet.  Post op calcium level on morning following surgery was 8.6 mg/dl.  Patient had hemodialysis on POD#1.  Patient was prepared for discharge home on POD#2.  Consults: nephrology  Treatments: surgery: parathyroidectomy with autotransplantation  Discharge Exam: Blood pressure 105/79, pulse 95, temperature 98.2 F (36.8 C), temperature source Oral, resp. rate 17, height 6' (1.829 m), weight 85.6 kg (188 lb 11.4 oz), SpO2 99 %. HEENT - clear Neck - wound dry and intact; voice normal; mild STS Chest - clear bilaterally Cor - RRR Ext - left forearm wound dry and intact  Disposition: Home  Discharge Instructions    Diet - low sodium heart healthy    Complete by:  As directed    Discharge instructions    Complete by:  As directed    [facility]  THYROID & PARATHYROID SURGERY:  POST-OP INSTRUCTIONS  Always review your discharge instruction sheet from the facility where your surgery was performed.  A prescription for pain medication may be given to you upon discharge.  Take your pain medication as prescribed.  If narcotic pain medicine is not needed, then you may take acetaminophen (Tylenol) or ibuprofen (Advil) as needed.  Take your usually prescribed medications unless otherwise directed.  If you need a refill on your pain  medication, please contact our office during regular business hours.  Prescriptions will not be processed by our office after 5 pm or on weekends.  Start with a light diet upon arrival home, such as soup and crackers or toast.  Be sure to drink plenty of fluids daily.  Resume your normal diet the day after surgery.  Most patients will experience some swelling and bruising on the chest and neck area.  Ice packs will help.  Swelling and bruising can take several days to resolve.   It is common to experience some constipation after surgery.  Increasing fluid intake and taking a stool softener (Colace) will usually help or prevent this problem.  A mild laxative (Milk of Magnesia or Miralax) should be taken according to package directions if there has been no bowel movement after 48 hours.  You have steri-strips and a gauze dressing over your incision.  You may remove the gauze bandage on the second day after surgery, and you may shower at that time.  Leave your steri-strips (small skin tapes) in place directly over the incision.  These strips should remain on the skin for 5-7 days and then be removed.  You may get them wet in the shower and pat them dry.  You may resume regular (light) daily activities beginning the next day - such as daily self-care, walking, climbing stairs - gradually increasing activities as tolerated.  You may have sexual intercourse when it is comfortable.  Refrain from any heavy lifting or straining until approved by your doctor.  You may drive when you no longer are taking prescription pain medication, you can comfortably wear a  seatbelt, and you can safely maneuver your car and apply brakes.  You should see your doctor in the office for a follow-up appointment approximately three weeks after your surgery.  Make sure that you call for this appointment within a day or two after you arrive home to insure a convenient appointment time.  WHEN TO CALL YOUR DOCTOR: -- Fever greater than  101.5 -- Inability to urinate -- Nausea and/or vomiting - persistent -- Extreme swelling or bruising -- Continued bleeding from incision -- Increased pain, redness, or drainage from the incision -- Difficulty swallowing or breathing -- Muscle cramping or spasms -- Numbness or tingling in hands or around lips  The clinic staff is available to answer your questions during regular business hours.  Please don't hesitate to call and ask to speak to one of the nurses if you have concerns.  [name], MD, [facility] Office: [phone]  Website: [email or web address]   Increase activity slowly    Complete by:  As directed    No dressing needed    Complete by:  As directed      Allergies as of 06/14/2016      Reactions   Infed [iron Dextran] Other (See Comments)   Decreased BP    Oxycodone Nausea Only   Vicodin [hydrocodone-acetaminophen] Other (See Comments)   Decreased BP   Diclofenac Other (See Comments)   Unknown   Whole Blood Other (See Comments)   Patient refuses whole blood "unless I am going to die"      Medication List    TAKE these medications   aspirin EC 81 MG tablet Take 1 tablet (81 mg total) by mouth daily.   atorvastatin 10 MG tablet Commonly known as:  LIPITOR Take 1 tablet (10 mg total) by mouth daily at 6 PM.   augmented betamethasone dipropionate 0.05 % ointment Commonly known as:  DIPROLENE-AF Apply 1 application topically as needed (for scalp).   butalbital-acetaminophen-caffeine 50-325-40 MG tablet Commonly known as:  FIORICET, ESGIC Take 1 tablet by mouth every 12 (twelve) hours as needed for headache. No more than 2 tabs a day and no more than 5 tabs a week.   calcium acetate 667 MG capsule Commonly known as:  PHOSLO Take 2,001-3,335 mg by mouth 3 (three) times daily with meals.   docusate sodium 100 MG capsule Commonly known as:  COLACE Take 1 capsule (100 mg total) by  mouth 2 (two) times daily as needed for mild constipation.   folic acid-vitamin b complex-vitamin c-selenium-zinc 3 MG Tabs tablet Take 1 tablet by mouth daily.   gabapentin 100 MG capsule Commonly known as:  NEURONTIN Take 100 mg by mouth 2 (two) times daily.   ibuprofen 800 MG tablet Commonly known as:  ADVIL,MOTRIN Take 800 mg by mouth 3 (three) times daily as needed for mild pain.   lanthanum 1000 MG chewable tablet Commonly known as:  FOSRENOL Chew 1,000 mg by mouth 3 (three) times daily with meals.   levETIRAcetam 750 MG tablet Commonly known as:  KEPPRA Take 1 tablet (750 mg total) by mouth 2 (two) times daily.   omeprazole 20 MG capsule Commonly known as:  PRILOSEC Take 1 capsule (20 mg total) by mouth daily. 30-60 mins before breakfast. What changed:  when to take this  reasons to take this  additional instructions   ondansetron 4 MG tablet Commonly known as:  ZOFRAN Take 1 tablet (4 mg total) by mouth every 6 (six)  hours. What changed:  when to take this  reasons to take this   oxyCODONE 5 MG immediate release tablet Commonly known as:  Oxy IR/ROXICODONE Take 1-2 tablets (5-10 mg total) by mouth every 4 (four) hours as needed for moderate pain.   sorbitol 70 % solution Take 15-60 mLs by mouth daily as needed ((do not take on/before dialysis days)).      Follow-up Information    Bahja Bence M, MD. Schedule an appointment as soon as possible for a visit in 4 week(s).   Specialty:  General Surgery Contact information: [address] [place] [address] [phone]           [name], MD, [facility] Office: [phone]   Signed: [name] 06/14/2016, 9:35 AM

## 2016-06-14 NOTE — Care Management Note (Signed)
Case Management Note  Patient Details  Name: Curlee Bogan Sr. MRN: [id number] Date of Birth: [birth year]  Subjective/Objective:                 Independent patient from home admitted with hyperparathyroidism. Patient with order to DC to home. No CM consult, orders, or needs identified at this time.   Action/Plan:  DC to home.  Expected Discharge Date:  06/14/16               Expected Discharge Plan:  Home/Self Care  In-House Referral:     Discharge planning Services  CM Consult  Post Acute Care Choice:    Choice offered to:     DME Arranged:    DME Agency:     HH Arranged:    HH Agency:     Status of Service:  Completed, signed off  If discussed at [employer] of Stay Meetings, dates discussed:    Additional Comments:  Carles Collet, RN 06/14/2016, 10:04 AM

## 2016-06-15 DIAGNOSIS — D631 Anemia in chronic kidney disease: Secondary | ICD-10-CM | POA: Diagnosis not present

## 2016-06-15 DIAGNOSIS — N2581 Secondary hyperparathyroidism of renal origin: Secondary | ICD-10-CM | POA: Diagnosis not present

## 2016-06-15 DIAGNOSIS — E1129 Type 2 diabetes mellitus with other diabetic kidney complication: Secondary | ICD-10-CM | POA: Diagnosis not present

## 2016-06-15 DIAGNOSIS — N186 End stage renal disease: Secondary | ICD-10-CM | POA: Diagnosis not present

## 2016-06-15 DIAGNOSIS — E875 Hyperkalemia: Secondary | ICD-10-CM | POA: Diagnosis not present

## 2016-06-18 DIAGNOSIS — E875 Hyperkalemia: Secondary | ICD-10-CM | POA: Diagnosis not present

## 2016-06-18 DIAGNOSIS — N2581 Secondary hyperparathyroidism of renal origin: Secondary | ICD-10-CM | POA: Diagnosis not present

## 2016-06-18 DIAGNOSIS — D631 Anemia in chronic kidney disease: Secondary | ICD-10-CM | POA: Diagnosis not present

## 2016-06-18 DIAGNOSIS — E1129 Type 2 diabetes mellitus with other diabetic kidney complication: Secondary | ICD-10-CM | POA: Diagnosis not present

## 2016-06-18 DIAGNOSIS — N186 End stage renal disease: Secondary | ICD-10-CM | POA: Diagnosis not present

## 2016-06-20 DIAGNOSIS — N2581 Secondary hyperparathyroidism of renal origin: Secondary | ICD-10-CM | POA: Diagnosis not present

## 2016-06-20 DIAGNOSIS — E1129 Type 2 diabetes mellitus with other diabetic kidney complication: Secondary | ICD-10-CM | POA: Diagnosis not present

## 2016-06-20 DIAGNOSIS — N186 End stage renal disease: Secondary | ICD-10-CM | POA: Diagnosis not present

## 2016-06-20 DIAGNOSIS — E875 Hyperkalemia: Secondary | ICD-10-CM | POA: Diagnosis not present

## 2016-06-20 DIAGNOSIS — D631 Anemia in chronic kidney disease: Secondary | ICD-10-CM | POA: Diagnosis not present

## 2016-06-22 DIAGNOSIS — N2581 Secondary hyperparathyroidism of renal origin: Secondary | ICD-10-CM | POA: Diagnosis not present

## 2016-06-22 DIAGNOSIS — E875 Hyperkalemia: Secondary | ICD-10-CM | POA: Diagnosis not present

## 2016-06-22 DIAGNOSIS — D631 Anemia in chronic kidney disease: Secondary | ICD-10-CM | POA: Diagnosis not present

## 2016-06-22 DIAGNOSIS — E1129 Type 2 diabetes mellitus with other diabetic kidney complication: Secondary | ICD-10-CM | POA: Diagnosis not present

## 2016-06-22 DIAGNOSIS — N186 End stage renal disease: Secondary | ICD-10-CM | POA: Diagnosis not present

## 2016-06-25 DIAGNOSIS — N186 End stage renal disease: Secondary | ICD-10-CM | POA: Diagnosis not present

## 2016-06-25 DIAGNOSIS — N2581 Secondary hyperparathyroidism of renal origin: Secondary | ICD-10-CM | POA: Diagnosis not present

## 2016-06-25 DIAGNOSIS — E1129 Type 2 diabetes mellitus with other diabetic kidney complication: Secondary | ICD-10-CM | POA: Diagnosis not present

## 2016-06-25 DIAGNOSIS — E875 Hyperkalemia: Secondary | ICD-10-CM | POA: Diagnosis not present

## 2016-06-25 DIAGNOSIS — D631 Anemia in chronic kidney disease: Secondary | ICD-10-CM | POA: Diagnosis not present

## 2016-06-27 DIAGNOSIS — E875 Hyperkalemia: Secondary | ICD-10-CM | POA: Diagnosis not present

## 2016-06-27 DIAGNOSIS — N2581 Secondary hyperparathyroidism of renal origin: Secondary | ICD-10-CM | POA: Diagnosis not present

## 2016-06-27 DIAGNOSIS — N186 End stage renal disease: Secondary | ICD-10-CM | POA: Diagnosis not present

## 2016-06-27 DIAGNOSIS — E1129 Type 2 diabetes mellitus with other diabetic kidney complication: Secondary | ICD-10-CM | POA: Diagnosis not present

## 2016-06-27 DIAGNOSIS — D631 Anemia in chronic kidney disease: Secondary | ICD-10-CM | POA: Diagnosis not present

## 2016-06-29 DIAGNOSIS — N186 End stage renal disease: Secondary | ICD-10-CM | POA: Diagnosis not present

## 2016-06-29 DIAGNOSIS — N2581 Secondary hyperparathyroidism of renal origin: Secondary | ICD-10-CM | POA: Diagnosis not present

## 2016-06-29 DIAGNOSIS — D631 Anemia in chronic kidney disease: Secondary | ICD-10-CM | POA: Diagnosis not present

## 2016-06-29 DIAGNOSIS — E1129 Type 2 diabetes mellitus with other diabetic kidney complication: Secondary | ICD-10-CM | POA: Diagnosis not present

## 2016-06-29 DIAGNOSIS — E875 Hyperkalemia: Secondary | ICD-10-CM | POA: Diagnosis not present

## 2016-07-02 DIAGNOSIS — E875 Hyperkalemia: Secondary | ICD-10-CM | POA: Diagnosis not present

## 2016-07-02 DIAGNOSIS — N186 End stage renal disease: Secondary | ICD-10-CM | POA: Diagnosis not present

## 2016-07-02 DIAGNOSIS — D631 Anemia in chronic kidney disease: Secondary | ICD-10-CM | POA: Diagnosis not present

## 2016-07-02 DIAGNOSIS — E1129 Type 2 diabetes mellitus with other diabetic kidney complication: Secondary | ICD-10-CM | POA: Diagnosis not present

## 2016-07-02 DIAGNOSIS — N2581 Secondary hyperparathyroidism of renal origin: Secondary | ICD-10-CM | POA: Diagnosis not present

## 2016-07-04 DIAGNOSIS — E875 Hyperkalemia: Secondary | ICD-10-CM | POA: Diagnosis not present

## 2016-07-04 DIAGNOSIS — N186 End stage renal disease: Secondary | ICD-10-CM | POA: Diagnosis not present

## 2016-07-04 DIAGNOSIS — E1129 Type 2 diabetes mellitus with other diabetic kidney complication: Secondary | ICD-10-CM | POA: Diagnosis not present

## 2016-07-04 DIAGNOSIS — N2581 Secondary hyperparathyroidism of renal origin: Secondary | ICD-10-CM | POA: Diagnosis not present

## 2016-07-04 DIAGNOSIS — D631 Anemia in chronic kidney disease: Secondary | ICD-10-CM | POA: Diagnosis not present

## 2016-07-06 ENCOUNTER — Encounter: Payer: Self-pay | Admitting: Internal Medicine

## 2016-07-06 DIAGNOSIS — N2581 Secondary hyperparathyroidism of renal origin: Secondary | ICD-10-CM | POA: Diagnosis not present

## 2016-07-06 DIAGNOSIS — E875 Hyperkalemia: Secondary | ICD-10-CM | POA: Diagnosis not present

## 2016-07-06 DIAGNOSIS — E1129 Type 2 diabetes mellitus with other diabetic kidney complication: Secondary | ICD-10-CM | POA: Diagnosis not present

## 2016-07-06 DIAGNOSIS — D631 Anemia in chronic kidney disease: Secondary | ICD-10-CM | POA: Diagnosis not present

## 2016-07-06 DIAGNOSIS — N186 End stage renal disease: Secondary | ICD-10-CM | POA: Diagnosis not present

## 2016-07-09 DIAGNOSIS — N2581 Secondary hyperparathyroidism of renal origin: Secondary | ICD-10-CM | POA: Diagnosis not present

## 2016-07-09 DIAGNOSIS — D631 Anemia in chronic kidney disease: Secondary | ICD-10-CM | POA: Diagnosis not present

## 2016-07-09 DIAGNOSIS — E1129 Type 2 diabetes mellitus with other diabetic kidney complication: Secondary | ICD-10-CM | POA: Diagnosis not present

## 2016-07-09 DIAGNOSIS — N186 End stage renal disease: Secondary | ICD-10-CM | POA: Diagnosis not present

## 2016-07-09 DIAGNOSIS — E875 Hyperkalemia: Secondary | ICD-10-CM | POA: Diagnosis not present

## 2016-07-11 DIAGNOSIS — D631 Anemia in chronic kidney disease: Secondary | ICD-10-CM | POA: Diagnosis not present

## 2016-07-11 DIAGNOSIS — N186 End stage renal disease: Secondary | ICD-10-CM | POA: Diagnosis not present

## 2016-07-11 DIAGNOSIS — E875 Hyperkalemia: Secondary | ICD-10-CM | POA: Diagnosis not present

## 2016-07-11 DIAGNOSIS — N2581 Secondary hyperparathyroidism of renal origin: Secondary | ICD-10-CM | POA: Diagnosis not present

## 2016-07-11 DIAGNOSIS — E1129 Type 2 diabetes mellitus with other diabetic kidney complication: Secondary | ICD-10-CM | POA: Diagnosis not present

## 2016-07-12 DIAGNOSIS — Z992 Dependence on renal dialysis: Secondary | ICD-10-CM | POA: Diagnosis not present

## 2016-07-12 DIAGNOSIS — I12 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 stage renal disease: Secondary | ICD-10-CM | POA: Diagnosis not present

## 2016-07-12 DIAGNOSIS — N186 End stage renal disease: Secondary | ICD-10-CM | POA: Diagnosis not present

## 2016-07-13 DIAGNOSIS — D631 Anemia in chronic kidney disease: Secondary | ICD-10-CM | POA: Diagnosis not present

## 2016-07-13 DIAGNOSIS — E875 Hyperkalemia: Secondary | ICD-10-CM | POA: Diagnosis not present

## 2016-07-13 DIAGNOSIS — N186 End stage renal disease: Secondary | ICD-10-CM | POA: Diagnosis not present

## 2016-07-13 DIAGNOSIS — N2581 Secondary hyperparathyroidism of renal origin: Secondary | ICD-10-CM | POA: Diagnosis not present

## 2016-07-14 NOTE — Addendum Note (Signed)
Addendum  created 07/14/16 0902 by Duane Boston, MD   Sign clinical note

## 2016-07-16 DIAGNOSIS — D631 Anemia in chronic kidney disease: Secondary | ICD-10-CM | POA: Diagnosis not present

## 2016-07-16 DIAGNOSIS — N2581 Secondary hyperparathyroidism of renal origin: Secondary | ICD-10-CM | POA: Diagnosis not present

## 2016-07-16 DIAGNOSIS — N186 End stage renal disease: Secondary | ICD-10-CM | POA: Diagnosis not present

## 2016-07-16 DIAGNOSIS — E875 Hyperkalemia: Secondary | ICD-10-CM | POA: Diagnosis not present

## 2016-07-18 DIAGNOSIS — N186 End stage renal disease: Secondary | ICD-10-CM | POA: Diagnosis not present

## 2016-07-18 DIAGNOSIS — D631 Anemia in chronic kidney disease: Secondary | ICD-10-CM | POA: Diagnosis not present

## 2016-07-18 DIAGNOSIS — E875 Hyperkalemia: Secondary | ICD-10-CM | POA: Diagnosis not present

## 2016-07-18 DIAGNOSIS — N2581 Secondary hyperparathyroidism of renal origin: Secondary | ICD-10-CM | POA: Diagnosis not present

## 2016-07-20 DIAGNOSIS — N2581 Secondary hyperparathyroidism of renal origin: Secondary | ICD-10-CM | POA: Diagnosis not present

## 2016-07-20 DIAGNOSIS — N186 End stage renal disease: Secondary | ICD-10-CM | POA: Diagnosis not present

## 2016-07-20 DIAGNOSIS — E875 Hyperkalemia: Secondary | ICD-10-CM | POA: Diagnosis not present

## 2016-07-20 DIAGNOSIS — D631 Anemia in chronic kidney disease: Secondary | ICD-10-CM | POA: Diagnosis not present

## 2016-07-23 DIAGNOSIS — N186 End stage renal disease: Secondary | ICD-10-CM | POA: Diagnosis not present

## 2016-07-23 DIAGNOSIS — D631 Anemia in chronic kidney disease: Secondary | ICD-10-CM | POA: Diagnosis not present

## 2016-07-23 DIAGNOSIS — N2581 Secondary hyperparathyroidism of renal origin: Secondary | ICD-10-CM | POA: Diagnosis not present

## 2016-07-23 DIAGNOSIS — E875 Hyperkalemia: Secondary | ICD-10-CM | POA: Diagnosis not present

## 2016-07-25 DIAGNOSIS — D631 Anemia in chronic kidney disease: Secondary | ICD-10-CM | POA: Diagnosis not present

## 2016-07-25 DIAGNOSIS — E875 Hyperkalemia: Secondary | ICD-10-CM | POA: Diagnosis not present

## 2016-07-25 DIAGNOSIS — N2581 Secondary hyperparathyroidism of renal origin: Secondary | ICD-10-CM | POA: Diagnosis not present

## 2016-07-25 DIAGNOSIS — N186 End stage renal disease: Secondary | ICD-10-CM | POA: Diagnosis not present

## 2016-07-27 DIAGNOSIS — N186 End stage renal disease: Secondary | ICD-10-CM | POA: Diagnosis not present

## 2016-07-27 DIAGNOSIS — N2581 Secondary hyperparathyroidism of renal origin: Secondary | ICD-10-CM | POA: Diagnosis not present

## 2016-07-31 DIAGNOSIS — E875 Hyperkalemia: Secondary | ICD-10-CM | POA: Diagnosis not present

## 2016-07-31 DIAGNOSIS — N186 End stage renal disease: Secondary | ICD-10-CM | POA: Diagnosis not present

## 2016-07-31 DIAGNOSIS — N2581 Secondary hyperparathyroidism of renal origin: Secondary | ICD-10-CM | POA: Diagnosis not present

## 2016-07-31 DIAGNOSIS — D631 Anemia in chronic kidney disease: Secondary | ICD-10-CM | POA: Diagnosis not present

## 2016-08-01 ENCOUNTER — Telehealth: Payer: Self-pay | Admitting: Neurology

## 2016-08-01 ENCOUNTER — Encounter: Payer: Self-pay | Admitting: Neurology

## 2016-08-01 DIAGNOSIS — D631 Anemia in chronic kidney disease: Secondary | ICD-10-CM | POA: Diagnosis not present

## 2016-08-01 DIAGNOSIS — E875 Hyperkalemia: Secondary | ICD-10-CM | POA: Diagnosis not present

## 2016-08-01 DIAGNOSIS — N186 End stage renal disease: Secondary | ICD-10-CM | POA: Diagnosis not present

## 2016-08-01 DIAGNOSIS — N2581 Secondary hyperparathyroidism of renal origin: Secondary | ICD-10-CM | POA: Diagnosis not present

## 2016-08-01 NOTE — Telephone Encounter (Signed)
Pt called back said he wanted this done before 5 today, I reminded him he said earlier it was by Friday. I explained the msg had been sent to the wid, he has been seeing patients but he would see the message. He understood

## 2016-08-01 NOTE — Telephone Encounter (Signed)
Pt called said he needs a clearance letter for surgery. Said he on the transplant list for kidney and [facility] called him today. He said they are going to put him back on the transplant list once they rec the clearance letter. He said [facility] needs this by Friday.  The pt is requesting a call back, he understands Dr Erlinda Hong is not in the office until Monday but he wants this addressed asap.

## 2016-08-01 NOTE — Telephone Encounter (Signed)
I called the patient. The patient has been doing well, headaches are gone. He had an episode of PRES on 05/22/2014. He has had some abnormalities of the MRI the brain that have been stable, the last study was done on 12/02/2015.  The patient is off of Keppra, no further seizure events, is headaches have improved and gone away. He is on the list for a renal transplant. There are no neurologic contraindications to this.  I will write a letter for him. This is to be faxed to [phone].

## 2016-08-03 DIAGNOSIS — E875 Hyperkalemia: Secondary | ICD-10-CM | POA: Diagnosis not present

## 2016-08-03 DIAGNOSIS — N186 End stage renal disease: Secondary | ICD-10-CM | POA: Diagnosis not present

## 2016-08-03 DIAGNOSIS — N2581 Secondary hyperparathyroidism of renal origin: Secondary | ICD-10-CM | POA: Diagnosis not present

## 2016-08-03 DIAGNOSIS — D631 Anemia in chronic kidney disease: Secondary | ICD-10-CM | POA: Diagnosis not present

## 2016-08-06 DIAGNOSIS — N2581 Secondary hyperparathyroidism of renal origin: Secondary | ICD-10-CM | POA: Diagnosis not present

## 2016-08-06 DIAGNOSIS — N186 End stage renal disease: Secondary | ICD-10-CM | POA: Diagnosis not present

## 2016-08-06 DIAGNOSIS — E875 Hyperkalemia: Secondary | ICD-10-CM | POA: Diagnosis not present

## 2016-08-06 DIAGNOSIS — D631 Anemia in chronic kidney disease: Secondary | ICD-10-CM | POA: Diagnosis not present

## 2016-08-08 DIAGNOSIS — E875 Hyperkalemia: Secondary | ICD-10-CM | POA: Diagnosis not present

## 2016-08-08 DIAGNOSIS — D631 Anemia in chronic kidney disease: Secondary | ICD-10-CM | POA: Diagnosis not present

## 2016-08-08 DIAGNOSIS — N2581 Secondary hyperparathyroidism of renal origin: Secondary | ICD-10-CM | POA: Diagnosis not present

## 2016-08-08 DIAGNOSIS — N186 End stage renal disease: Secondary | ICD-10-CM | POA: Diagnosis not present

## 2016-08-10 DIAGNOSIS — N186 End stage renal disease: Secondary | ICD-10-CM | POA: Diagnosis not present

## 2016-08-10 DIAGNOSIS — N2581 Secondary hyperparathyroidism of renal origin: Secondary | ICD-10-CM | POA: Diagnosis not present

## 2016-08-10 DIAGNOSIS — D631 Anemia in chronic kidney disease: Secondary | ICD-10-CM | POA: Diagnosis not present

## 2016-08-10 DIAGNOSIS — E875 Hyperkalemia: Secondary | ICD-10-CM | POA: Diagnosis not present

## 2016-08-11 DIAGNOSIS — Z992 Dependence on renal dialysis: Secondary | ICD-10-CM | POA: Diagnosis not present

## 2016-08-11 DIAGNOSIS — N186 End stage renal disease: Secondary | ICD-10-CM | POA: Diagnosis not present

## 2016-08-11 DIAGNOSIS — I12 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 stage renal disease: Secondary | ICD-10-CM | POA: Diagnosis not present

## 2016-08-13 DIAGNOSIS — N186 End stage renal disease: Secondary | ICD-10-CM | POA: Diagnosis not present

## 2016-08-13 DIAGNOSIS — N2581 Secondary hyperparathyroidism of renal origin: Secondary | ICD-10-CM | POA: Diagnosis not present

## 2016-08-13 DIAGNOSIS — E875 Hyperkalemia: Secondary | ICD-10-CM | POA: Diagnosis not present

## 2016-08-13 DIAGNOSIS — E1129 Type 2 diabetes mellitus with other diabetic kidney complication: Secondary | ICD-10-CM | POA: Diagnosis not present

## 2016-08-13 DIAGNOSIS — D631 Anemia in chronic kidney disease: Secondary | ICD-10-CM | POA: Diagnosis not present

## 2016-08-15 DIAGNOSIS — N2581 Secondary hyperparathyroidism of renal origin: Secondary | ICD-10-CM | POA: Diagnosis not present

## 2016-08-15 DIAGNOSIS — N186 End stage renal disease: Secondary | ICD-10-CM | POA: Diagnosis not present

## 2016-08-15 DIAGNOSIS — D631 Anemia in chronic kidney disease: Secondary | ICD-10-CM | POA: Diagnosis not present

## 2016-08-15 DIAGNOSIS — E875 Hyperkalemia: Secondary | ICD-10-CM | POA: Diagnosis not present

## 2016-08-15 DIAGNOSIS — E1129 Type 2 diabetes mellitus with other diabetic kidney complication: Secondary | ICD-10-CM | POA: Diagnosis not present

## 2016-08-17 DIAGNOSIS — N2581 Secondary hyperparathyroidism of renal origin: Secondary | ICD-10-CM | POA: Diagnosis not present

## 2016-08-17 DIAGNOSIS — E1129 Type 2 diabetes mellitus with other diabetic kidney complication: Secondary | ICD-10-CM | POA: Diagnosis not present

## 2016-08-17 DIAGNOSIS — D631 Anemia in chronic kidney disease: Secondary | ICD-10-CM | POA: Diagnosis not present

## 2016-08-17 DIAGNOSIS — E875 Hyperkalemia: Secondary | ICD-10-CM | POA: Diagnosis not present

## 2016-08-17 DIAGNOSIS — N186 End stage renal disease: Secondary | ICD-10-CM | POA: Diagnosis not present

## 2016-08-20 DIAGNOSIS — N2581 Secondary hyperparathyroidism of renal origin: Secondary | ICD-10-CM | POA: Diagnosis not present

## 2016-08-20 DIAGNOSIS — E1129 Type 2 diabetes mellitus with other diabetic kidney complication: Secondary | ICD-10-CM | POA: Diagnosis not present

## 2016-08-20 DIAGNOSIS — D631 Anemia in chronic kidney disease: Secondary | ICD-10-CM | POA: Diagnosis not present

## 2016-08-20 DIAGNOSIS — N186 End stage renal disease: Secondary | ICD-10-CM | POA: Diagnosis not present

## 2016-08-20 DIAGNOSIS — E875 Hyperkalemia: Secondary | ICD-10-CM | POA: Diagnosis not present

## 2016-08-21 ENCOUNTER — Telehealth: Payer: Self-pay

## 2016-08-21 ENCOUNTER — Ambulatory Visit (INDEPENDENT_AMBULATORY_CARE_PROVIDER_SITE_OTHER): Payer: Medicare Other | Admitting: Neurology

## 2016-08-21 ENCOUNTER — Encounter: Payer: Self-pay | Admitting: Neurology

## 2016-08-21 VITALS — BP 105/63 | HR 89 | Wt 191.2 lb

## 2016-08-21 DIAGNOSIS — I6783 Posterior reversible encephalopathy syndrome: Secondary | ICD-10-CM | POA: Diagnosis not present

## 2016-08-21 DIAGNOSIS — G4489 Other headache syndrome: Secondary | ICD-10-CM | POA: Diagnosis not present

## 2016-08-21 DIAGNOSIS — N186 End stage renal disease: Secondary | ICD-10-CM

## 2016-08-21 DIAGNOSIS — G039 Meningitis, unspecified: Secondary | ICD-10-CM | POA: Diagnosis not present

## 2016-08-21 DIAGNOSIS — G031 Chronic meningitis: Secondary | ICD-10-CM

## 2016-08-21 DIAGNOSIS — Z992 Dependence on renal dialysis: Secondary | ICD-10-CM

## 2016-08-21 DIAGNOSIS — R519 Headache, unspecified: Secondary | ICD-10-CM | POA: Insufficient documentation

## 2016-08-21 DIAGNOSIS — K5732 Diverticulitis of large intestine without perforation or abscess without bleeding: Secondary | ICD-10-CM

## 2016-08-21 DIAGNOSIS — N2581 Secondary hyperparathyroidism of renal origin: Secondary | ICD-10-CM | POA: Diagnosis not present

## 2016-08-21 NOTE — Telephone Encounter (Signed)
Rn call patient per Dr. Erlinda Hong request. Dr. Erlinda Hong states he needs eye report from pts  MD. Rn call patient to notify him of this report. Pt stated he will call the eye doctor to get the last report, and bring it to [place].

## 2016-08-21 NOTE — Progress Notes (Signed)
STROKE NEUROLOGY FOLLOW UP NOTE  NAME: Carl Leveque Sr. DOB: [birth year]  REASON FOR VISIT: stroke follow up HISTORY FROM: pt and chart  Today we had the pleasure of seeing Carl Bourdon Sr. in follow-up at our Neurology Clinic. Pt was accompanied by no one.   History Summary [name] is a 56 y.o. male with history of HTN, ESRD on HD was admitted for acute onset of dizziness on 05/22/14. He was helping his brother for car repair and struck his head at the top to the hood. A couple of hours later, he felt very dizzy and not right. 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 Also, right occipital pole has tiny DWI changes which may related to PRES. He was admitted for further evaluation. Stroke work up including MRA, TTE and CUS unremarkable. LDL 91. During hospitalization, his dizziness resolved and he was discharged with ASA 325 and lipitor.   08/16/14 follow up - the patient has been doing well. No recurrent stroke symptoms or seizure. However, he had LGIB in 06/2014 and was admitted. Hb as low as to 5.4. He refused blood transfusion. His ASA was d/c'ed. LGIB gradually stopped and last check Hb was 10.4. Currently, he feels good without any abdominal pain and no bleeding. He is in the waiting list for kidney transplantation. BP today 130/86.  11/02/14 follow up - the patient has been doing well. No recurrent symptoms. His GIB resolved and he is on ASA without problems. BP 116/75 today. No HA or seizure. No vision changes.    05/05/15 follow up - he has been doing well. He went to ED on 02/05/15 for migraine headache. CT brain no acute findings. Improved after pain management in ED. Given fioricet for HA and as per report he felt a lot better with the medication. Otherwise, pt has no complains. Continued on HD 3/week.   10/20/15 follow up - he was admitted in 07/2015 for acute LGIB and  blood loss anemia. Had GI consult and colonoscopy showed colon ulcer and nearly perforated cecal mass. Had colectomy with anastomosis and biopsy showed no malignancy. Treated with IV zosyn for 7 days. His ASA resumed. He went to ED again in 08/2015 for abdominal pain and CT found to have diverticulitis, and he was treated with cipro and flagyl. In 09/2015 he was admitted for atypical chest pain, EKG showed diffuse ST elevations concerning for pericardial effusion. Cardiac enzyme negative. However, TTE did not show pericardial effusion. He was treated with low dose colchicine for ? Uremic pericarditis.   Pt neurologically stable since last visit, BP good or low at home, today in clinic 104/71. Had b/l cataract surgery about 3 months ago as per pt. Still has intermittent HA, same as before, sometimes more severe than others. Stated fioricet helped in the past but no more refills. No fever or vision changes.   12/02/15 admission - he was admitted for sudden onset RUE jerking, numbness and weakness, resolved prior to ED arrival. There was also equivocal right facial twitching at that time. MRI and MRA no acute abnormality, but still chronic dural thickening. EEG normal. LDL 72 and A1c 4.8. Due to concerning for seizure, he was put on Keppra 750 mg.  02/2016 ED visit - Since discharge, patient HA getting more frequent, almost daily, comes and goes, at top of head, punding, 6-7/10. Starting this month, HA more severe than  before, he has to use fioricet and ibuprofen. On 02/16/16, he went to ED due to severe HA at midnight. Was given migraine cocktail and improved before discharge.   02/21/16 follow up - pt has been doing well. No more seizure like activity. Still has HA but mild. Had eye exam 2 weeks ago at Dr. Katy Fitch office, but not remember the result. He is still on keppra 750mg  daily, no addition dose after HD. BP 113/70.   Interval History During the interval time, pt has been doing well. HA is gone after stopping  keppra. He underwent parathyroidectomy in 06/2016. He was also back to kidney transplant list last month. He has no other complains, BP today 105/63.    REVIEW OF SYSTEMS: Full 14 system review of systems performed and notable only for those listed below and in HPI above, all others are negative:  Constitutional:   Cardiovascular:  Ear/Nose/Throat:   Skin:  Eyes:   Respiratory:   Gastroitestinal:   Genitourinary:  Hematology/Lymphatic:   Endocrine:  Musculoskeletal:   Allergy/Immunology:   Neurological:   Psychiatric:  Sleep:   The following represents the patient's updated allergies and side effects list: Allergies  Allergen Reactions  . Infed [Iron Dextran] Other (See Comments)    Decreased BP   . Oxycodone Nausea Only  . Vicodin [Hydrocodone-Acetaminophen] Other (See Comments)    Decreased BP  . Diclofenac Other (See Comments)    Unknown  . Whole Blood Other (See Comments)    Patient refuses whole blood "unless I am going to die"    The neurologically relevant items on the patient's problem list were reviewed on today's visit.  Neurologic Examination  A problem focused neurological exam (12 or more points of the single system neurologic examination, vital signs counts as 1 point, cranial nerves count for 8 points) was performed.  Blood pressure 105/63, pulse 89, weight 191 lb 3.2 oz (86.7 kg).  General - Well nourished, well developed, in no apparent distress.  Ophthalmologic - difficult to visualize due to small pupils. However, b/l disc margins blurry on first glimpse.   Cardiovascular - Regular rate and rhythm with no murmur.  Mental Status -  Level of arousal and orientation to time, place, and person were intact. Language including expression, naming, repetition, comprehension was assessed and found intact. Attention span and concentration were normal. Fund of Knowledge was assessed and was intact.  Cranial Nerves II - XII - II - Visual field intact  OU. III, IV, VI - Extraocular movements intact. V - Facial sensation intact bilaterally. VII - Facial movement intact bilaterally. VIII - Hearing & vestibular intact bilaterally. X - Palate elevates symmetrically. XI - Chin turning & shoulder shrug intact bilaterally. XII - Tongue protrusion intact.  Motor Strength - The patient's strength was normal in all extremities and pronator drift was absent.  Bulk was normal and fasciculations were absent.   Motor Tone - Muscle tone was assessed at the neck and appendages and was normal.  Reflexes - The patient's reflexes were 1+ in all extremities and he had no pathological reflexes.  Sensory - Light touch, temperature/pinprick were assessed and were normal.    Coordination - The patient had normal movements in the hands and feet with no ataxia or dysmetria.  Tremor was absent.  Gait and Station - The patient's transfers, posture, gait, station, and turns were observed as normal.  Data reviewed: I personally reviewed the images and agree with the radiology interpretations.  Ct Head (brain) Wo  Contrast 05/22/2014 Prominent dural calcification along the falx and tentorium again demonstrated. 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 No acute intracranial abnormalities. No significant change since previous study.   MRI HEAD  05/23/2014 Diffuse dural calcifications/dural thickening. Parietal-occipital FLAIR abnormality greater on the right suspicious for posterior reversible encephalopathy syndrome. Other potential causes for this appearance include that secondary to; artifact, meningitis, subarachnoid hemorrhage or result of external supplemental oxygen. Adequate flow within the superior sagittal sinus cannot be confirmed. This appearance may be chronic and related to the adjacent dural thickening however, sagittal sinus thrombosis could contribute to the parietal occipital FLAIR abnormality. Suggestion of tiny acute posterior left corona  radiata infarct. Subtle restricted motion medial aspect of the right parietal -occipital lobe may represent result of posterior reversible encephalopathy syndrome or small infarct. Abnormal vessel extends from the left internal cerebral vein level along the left ambient cistern to the superior margin of the left internal auditory canal. Question small dural fistula. Opacification left petrous apex. Abnormal bone marrow may represent anemia of renal disease. 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 Left parotid 1.4 x 1 centimeter nonspecific lesion incompletely assessed on present exam.   MRA HEAD  05/23/2014 Exam is motion degraded. Mild narrowing and irregularity right internal carotid artery cavernous segment without high-grade stenosis. Prominent left posterior communicating artery origin infundibulum without discrete aneurysm. Hypoplastic A1 segment right anterior cerebral artery. Azygous configuration A2 segment anterior cerebral artery. Middle cerebral artery mild to moderate branch vessel narrowing and irregularity. Non visualized left posterior inferior cerebellar artery. Mild narrowing distal right vertebral artery. Nonvisualized right anterior inferior cerebellar artery. Posterior cerebral artery branch vessel irregularity. Irregularity and narrowing superior cerebellar artery more notable on the right.   2D echo - - Left ventricle: The cavity size was normal. Systolic function was normal. The estimated ejection fraction was in the range of 55% to 60%. Wall motion was normal; there were no regional wall motion abnormalities. Doppler parameters are consistent with abnormal left ventricular relaxation (grade 1 diastolic dysfunction). - Pericardium, extracardiac: A trivial pericardial effusion was identified.  CUS - Bilateral: 1-39% ICA stenosis. Vertebral artery flow is antegrade.  MRI head 09/16/14 - Abnormal MRI scan of the brain showing diffuse dural thickening with  abnormal appearance of bilateral parieto-occipital cortex on T2/FLAIR images the finding of unclear significance with a differential discussed above. Stable incidental findings of Tornwaldt's cysts, opacification of the petrous apex and abnormal vessel in the ambient cistern.  MRA head 09/23/14 - Equivocal MRA head (without) demonstrating: 1. Small (67mm) posterior outpouching of the left left supra-clinoid internal carotid artery, may represent infundibular dilation at origin of left posterior communicating artery vs aneurysmal dilation. 2. No focal stenoses or occlusions of the medium to large sized vessels.   MRV 09/16/14 - Highly abnormal appearance of superior sagittal sinus with diminutive caliber likely related to patient's chronic dural thickening. Abnormal appearance of deep cerebral venous system likely related to degenerative caliber from dural thickening versus partial thrombosis. Correlate with catheter angiogram if clinically necessary  CT head 02/05/15 1. No acute intracranial abnormality. 2. Stable appearance of the head with prominence dural calcifications.  MRV 05/19/15 1. Decreased flow signal in the superior sagittal sinus anteriorly, left transverse sinus near the midline, deep cerebral veins (great vein of Galen and straight sinus) and other areas as mentioned above.  2. Prominent cortical cerebral veins noted throughout. 3. These findings may be related to chronic dural calcification / thickening vs chronic thrombosis with partial recannalization. 4. Compared to MRV  from 09/16/14, after accounting for differences in MRI field strengths, there is no significant change.   MRI brain 05/19/15 diffuse calcification and thickening of the tentorium with mild diffuse thickening of the meninges throughout. Abnormal appearance of and thickening of the right parieto-occipital cortex of unclear etiology. Abnormal blood vessel coursing through the ambient cistern to the left internal auditory  canal. Overall no significant change compared with previous MRI scan dated 09/16/14  MRI and MRA 12/02/15 1.  No acute intracranial abnormality. 2. Mild chronic small vessel disease appears stable since March. 2016 right parietal signal abnormality which was felt related to PRES has resolved without encephalomalacia. 3. 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 This appears stable since the MRV on 05/17/2015. 4. Stable since 2016 and largely unremarkable intracranial MRA. ICA siphon atherosclerosis without stenosis. Small right cavernous ICA segment pseudo-lesion versus infundibulum.  CUS 12/03/15 - Bilateral: intimal wall thickening CCA. Mild soft plaque origin ICA. 1-39% ICA plaquing. Vertebral artery flow is antegrade.  Component     Latest Ref Rng 05/23/2014 07/14/2014  Cholesterol     0 - 200 mg/dL 154   Triglycerides     <150 mg/dL 50   HDL Cholesterol     >39 mg/dL 53   Total CHOL/HDL Ratio      2.9   VLDL     0 - 40 mg/dL 10   LDL (calc)     0 - 99 mg/dL 91   Hemoglobin A1C     4.8 - 5.6 % 5.4   Mean Plasma Glucose      108   HIV Screen 4th Generation wRfx     Non Reactive  Non Reactive  Vitamin B-12     180 - 914 pg/mL  1227 (H)     Component     Latest Ref Rng & Units 10/02/2015  Cholesterol     0 - 200 mg/dL 129  Triglycerides     <150 mg/dL 64  HDL Cholesterol     >40 mg/dL 44  Total CHOL/HDL Ratio     RATIO 2.9  VLDL     0 - 40 mg/dL 13  LDL (calc)     0 - 99 mg/dL 72  Hemoglobin A1C     4.8 - 5.6 % 4.8  Mean Plasma Glucose     mg/dL 91    Assessment: As you may recall, he is a 56 y.o. African American male with PMH of HTN, ESRD on HD was admitted for acute onset of dizziness on 05/22/14. 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 Also, right occipital pole has tiny DWI changes which may related to PRES.  Stroke work up including TTE and CUS unremarkable. LDL 91. His dizziness resolved and he was discharged with ASA 325 and lipitor. To document the resolution of PRES and to rule out CVT as there is some concern about cerebral veins in MRI, we repeated MRI, MRA and MRV and found that pt continued to have T2 hyperintensity bilateral parieto-occipital coretex and also has significant dural thickening with abnormal appearance of deep cerebral venous system with degenerative caliber, which may be able to explain the possible edema at bilateral parietooccipital region. 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 Not sure if there is any association with ESRD on HD. However, pt had no sign of chronic  infection, no hydrocephalus, no complaint of HA or vision changes. Will continue to monitor and repeat MRI annually.   Had admission in 08/2015 and 10/2015 for LGIB, found to have colon ulcer and diverticulitis, treated with antibiotics, underwent colectomy with anastomosis and biopsy showed no malignancy. Also 09/2015 admission for atypical CP, EKG showed ST elevation, and cardiac enzyme and TTE negative, question for uremic pericarditis, put on low dose colchicine. 06/2016 underwent parathyroidectomy for secondary hyperparathyroidism.   11/2015 had seizure like activity with RUE jerking, weakness and numbness, resolved before reaching ED. MRI and MRA no acute abnormality. CUS and EEG normal. Put on keppra 750mg . However, since then, he started to have worsening headache. Temporal relationship consistent with Keppra use, and as per "up to date", headache is a common side effect of Keppra, ranging from 14-19%. Pt seizure like activity no LOC, RUE brief jerking, EEG normal, no seizure in the past, so keppra was stopped and his HA subsequently resolved.   Plan:  - continue ASA and lipitor for stroke prevention - seizure precautions. - will request again eye  exam record from Dr. Katy Fitch office - Follow up with your primary care physician for stroke risk factor modification. Recommend maintain blood pressure goal <130/80, diabetes with hemoglobin A1c goal below 6.5% and lipids with LDL cholesterol goal below 70 mg/dL.  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   No orders of the defined types were placed in this encounter.   Patient Instructions  - continue ASA and lipitor for stroke prevention - seizure precautions. - will request again eye exam record from Dr. Katy Fitch office - Follow up with your primary care physician for stroke risk factor modification. Recommend maintain blood pressure goal <130/80, diabetes with hemoglobin A1c goal below 6.5% and lipids with LDL cholesterol goal below 70 mg/dL.  - follow up with nephrology for ESRD on HD - follow up with cardiology and GI regularly - check BP at home - will consider repeat MRI next visit.  - follow up in 6 months with me   Rosalin Hawking, MD PhD [facility] [address] [place], [place] [address] [phone]

## 2016-08-21 NOTE — Patient Instructions (Addendum)
-   continue ASA and lipitor for stroke prevention - seizure precautions. - will request again eye exam record from Dr. Katy Fitch office - Follow up with your primary care physician for stroke risk factor modification. Recommend maintain blood pressure goal <130/80, diabetes with hemoglobin A1c goal below 6.5% and lipids with LDL cholesterol goal below 70 mg/dL.  - follow up with nephrology for ESRD on HD - follow up with cardiology and GI regularly - check BP at home - will consider repeat MRI next visit.  - follow up in 6 months with me

## 2016-08-22 ENCOUNTER — Telehealth: Payer: Self-pay | Admitting: Neurology

## 2016-08-22 ENCOUNTER — Other Ambulatory Visit (HOSPITAL_COMMUNITY): Payer: Self-pay | Admitting: Surgery

## 2016-08-22 ENCOUNTER — Ambulatory Visit: Payer: Medicare Other | Admitting: Neurology

## 2016-08-22 DIAGNOSIS — E1129 Type 2 diabetes mellitus with other diabetic kidney complication: Secondary | ICD-10-CM | POA: Diagnosis not present

## 2016-08-22 DIAGNOSIS — N2581 Secondary hyperparathyroidism of renal origin: Secondary | ICD-10-CM

## 2016-08-22 DIAGNOSIS — E875 Hyperkalemia: Secondary | ICD-10-CM | POA: Diagnosis not present

## 2016-08-22 DIAGNOSIS — D631 Anemia in chronic kidney disease: Secondary | ICD-10-CM | POA: Diagnosis not present

## 2016-08-22 DIAGNOSIS — N186 End stage renal disease: Secondary | ICD-10-CM | POA: Diagnosis not present

## 2016-08-22 NOTE — Telephone Encounter (Signed)
Received eye exam report from Dr. Katy Fitch office. The exam done on 04/05/16 and the exam did not involve fundi exam.   His next exam is 08/30/16 with Dr. Katy Fitch and it will be an extended exam. Please remind the pt about the upcoming appointment and also let him ask Dr. Katy Fitch to perform a fundi exam, i.e. Look into the back of the eyes to see the optic nerve. Thanks much.   Rosalin Hawking, MD PhD Stroke Neurology 08/22/2016 5:31 PM

## 2016-08-23 NOTE — Telephone Encounter (Signed)
Rn sent patient a letter from Dr Erlinda Hong regarding eye exam. Pt needs to have a fundi exam on next  appt.

## 2016-08-23 NOTE — Progress Notes (Signed)
Patient sent a letter regarding eye exam from Dr. Erlinda Hong.

## 2016-08-24 DIAGNOSIS — E875 Hyperkalemia: Secondary | ICD-10-CM | POA: Diagnosis not present

## 2016-08-24 DIAGNOSIS — N186 End stage renal disease: Secondary | ICD-10-CM | POA: Diagnosis not present

## 2016-08-24 DIAGNOSIS — D631 Anemia in chronic kidney disease: Secondary | ICD-10-CM | POA: Diagnosis not present

## 2016-08-24 DIAGNOSIS — N2581 Secondary hyperparathyroidism of renal origin: Secondary | ICD-10-CM | POA: Diagnosis not present

## 2016-08-24 DIAGNOSIS — E1129 Type 2 diabetes mellitus with other diabetic kidney complication: Secondary | ICD-10-CM | POA: Diagnosis not present

## 2016-08-27 DIAGNOSIS — N186 End stage renal disease: Secondary | ICD-10-CM | POA: Diagnosis not present

## 2016-08-27 DIAGNOSIS — D631 Anemia in chronic kidney disease: Secondary | ICD-10-CM | POA: Diagnosis not present

## 2016-08-27 DIAGNOSIS — E875 Hyperkalemia: Secondary | ICD-10-CM | POA: Diagnosis not present

## 2016-08-27 DIAGNOSIS — N2581 Secondary hyperparathyroidism of renal origin: Secondary | ICD-10-CM | POA: Diagnosis not present

## 2016-08-27 DIAGNOSIS — E1129 Type 2 diabetes mellitus with other diabetic kidney complication: Secondary | ICD-10-CM | POA: Diagnosis not present

## 2016-08-29 DIAGNOSIS — E875 Hyperkalemia: Secondary | ICD-10-CM | POA: Diagnosis not present

## 2016-08-29 DIAGNOSIS — E1129 Type 2 diabetes mellitus with other diabetic kidney complication: Secondary | ICD-10-CM | POA: Diagnosis not present

## 2016-08-29 DIAGNOSIS — N2581 Secondary hyperparathyroidism of renal origin: Secondary | ICD-10-CM | POA: Diagnosis not present

## 2016-08-29 DIAGNOSIS — D631 Anemia in chronic kidney disease: Secondary | ICD-10-CM | POA: Diagnosis not present

## 2016-08-29 DIAGNOSIS — N186 End stage renal disease: Secondary | ICD-10-CM | POA: Diagnosis not present

## 2016-08-31 DIAGNOSIS — D631 Anemia in chronic kidney disease: Secondary | ICD-10-CM | POA: Diagnosis not present

## 2016-08-31 DIAGNOSIS — N186 End stage renal disease: Secondary | ICD-10-CM | POA: Diagnosis not present

## 2016-08-31 DIAGNOSIS — E1129 Type 2 diabetes mellitus with other diabetic kidney complication: Secondary | ICD-10-CM | POA: Diagnosis not present

## 2016-08-31 DIAGNOSIS — E875 Hyperkalemia: Secondary | ICD-10-CM | POA: Diagnosis not present

## 2016-08-31 DIAGNOSIS — N2581 Secondary hyperparathyroidism of renal origin: Secondary | ICD-10-CM | POA: Diagnosis not present

## 2016-09-03 DIAGNOSIS — E1129 Type 2 diabetes mellitus with other diabetic kidney complication: Secondary | ICD-10-CM | POA: Diagnosis not present

## 2016-09-03 DIAGNOSIS — N186 End stage renal disease: Secondary | ICD-10-CM | POA: Diagnosis not present

## 2016-09-03 DIAGNOSIS — N2581 Secondary hyperparathyroidism of renal origin: Secondary | ICD-10-CM | POA: Diagnosis not present

## 2016-09-03 DIAGNOSIS — D631 Anemia in chronic kidney disease: Secondary | ICD-10-CM | POA: Diagnosis not present

## 2016-09-03 DIAGNOSIS — E875 Hyperkalemia: Secondary | ICD-10-CM | POA: Diagnosis not present

## 2016-09-05 DIAGNOSIS — N186 End stage renal disease: Secondary | ICD-10-CM | POA: Diagnosis not present

## 2016-09-05 DIAGNOSIS — E1129 Type 2 diabetes mellitus with other diabetic kidney complication: Secondary | ICD-10-CM | POA: Diagnosis not present

## 2016-09-05 DIAGNOSIS — E875 Hyperkalemia: Secondary | ICD-10-CM | POA: Diagnosis not present

## 2016-09-05 DIAGNOSIS — N2581 Secondary hyperparathyroidism of renal origin: Secondary | ICD-10-CM | POA: Diagnosis not present

## 2016-09-05 DIAGNOSIS — D631 Anemia in chronic kidney disease: Secondary | ICD-10-CM | POA: Diagnosis not present

## 2016-09-07 DIAGNOSIS — E1129 Type 2 diabetes mellitus with other diabetic kidney complication: Secondary | ICD-10-CM | POA: Diagnosis not present

## 2016-09-07 DIAGNOSIS — D631 Anemia in chronic kidney disease: Secondary | ICD-10-CM | POA: Diagnosis not present

## 2016-09-07 DIAGNOSIS — N2581 Secondary hyperparathyroidism of renal origin: Secondary | ICD-10-CM | POA: Diagnosis not present

## 2016-09-07 DIAGNOSIS — N186 End stage renal disease: Secondary | ICD-10-CM | POA: Diagnosis not present

## 2016-09-07 DIAGNOSIS — E875 Hyperkalemia: Secondary | ICD-10-CM | POA: Diagnosis not present

## 2016-09-10 DIAGNOSIS — N186 End stage renal disease: Secondary | ICD-10-CM | POA: Diagnosis not present

## 2016-09-10 DIAGNOSIS — E875 Hyperkalemia: Secondary | ICD-10-CM | POA: Diagnosis not present

## 2016-09-10 DIAGNOSIS — D631 Anemia in chronic kidney disease: Secondary | ICD-10-CM | POA: Diagnosis not present

## 2016-09-10 DIAGNOSIS — N2581 Secondary hyperparathyroidism of renal origin: Secondary | ICD-10-CM | POA: Diagnosis not present

## 2016-09-10 DIAGNOSIS — E1129 Type 2 diabetes mellitus with other diabetic kidney complication: Secondary | ICD-10-CM | POA: Diagnosis not present

## 2016-09-11 ENCOUNTER — Encounter (HOSPITAL_COMMUNITY)
Admission: RE | Admit: 2016-09-11 | Discharge: 2016-09-11 | Disposition: A | Discharge status: Home / Self Care | Payer: Medicare Other | Source: Ambulatory Visit | Attending: Surgery | Admitting: Surgery

## 2016-09-11 DIAGNOSIS — I12 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 stage renal disease: Secondary | ICD-10-CM | POA: Diagnosis not present

## 2016-09-11 DIAGNOSIS — Z992 Dependence on renal dialysis: Secondary | ICD-10-CM | POA: Diagnosis not present

## 2016-09-11 DIAGNOSIS — N2581 Secondary hyperparathyroidism of renal origin: Secondary | ICD-10-CM | POA: Insufficient documentation

## 2016-09-11 DIAGNOSIS — N186 End stage renal disease: Secondary | ICD-10-CM | POA: Diagnosis not present

## 2016-09-11 DIAGNOSIS — E0789 Other specified disorders of thyroid: Secondary | ICD-10-CM | POA: Diagnosis not present

## 2016-09-11 MED ORDER — TECHNETIUM TC 99M SESTAMIBI - CARDIOLITE
24.5000 | Freq: Once | INTRAVENOUS | Status: AC | PRN
  Administered 2016-09-11: 24.5 via INTRAVENOUS

## 2016-09-12 ENCOUNTER — Encounter (HOSPITAL_COMMUNITY): Payer: Self-pay | Admitting: *Deleted

## 2016-09-12 DIAGNOSIS — N186 End stage renal disease: Secondary | ICD-10-CM | POA: Diagnosis not present

## 2016-09-12 DIAGNOSIS — N2581 Secondary hyperparathyroidism of renal origin: Secondary | ICD-10-CM | POA: Diagnosis not present

## 2016-09-12 DIAGNOSIS — E875 Hyperkalemia: Secondary | ICD-10-CM | POA: Diagnosis not present

## 2016-09-12 DIAGNOSIS — E1129 Type 2 diabetes mellitus with other diabetic kidney complication: Secondary | ICD-10-CM | POA: Diagnosis not present

## 2016-09-12 DIAGNOSIS — D631 Anemia in chronic kidney disease: Secondary | ICD-10-CM | POA: Diagnosis not present

## 2016-09-14 DIAGNOSIS — N186 End stage renal disease: Secondary | ICD-10-CM | POA: Diagnosis not present

## 2016-09-14 DIAGNOSIS — N2581 Secondary hyperparathyroidism of renal origin: Secondary | ICD-10-CM | POA: Diagnosis not present

## 2016-09-14 DIAGNOSIS — E1129 Type 2 diabetes mellitus with other diabetic kidney complication: Secondary | ICD-10-CM | POA: Diagnosis not present

## 2016-09-14 DIAGNOSIS — E875 Hyperkalemia: Secondary | ICD-10-CM | POA: Diagnosis not present

## 2016-09-14 DIAGNOSIS — D631 Anemia in chronic kidney disease: Secondary | ICD-10-CM | POA: Diagnosis not present

## 2016-09-17 DIAGNOSIS — N2581 Secondary hyperparathyroidism of renal origin: Secondary | ICD-10-CM | POA: Diagnosis not present

## 2016-09-17 DIAGNOSIS — N186 End stage renal disease: Secondary | ICD-10-CM | POA: Diagnosis not present

## 2016-09-17 DIAGNOSIS — E1129 Type 2 diabetes mellitus with other diabetic kidney complication: Secondary | ICD-10-CM | POA: Diagnosis not present

## 2016-09-17 DIAGNOSIS — D631 Anemia in chronic kidney disease: Secondary | ICD-10-CM | POA: Diagnosis not present

## 2016-09-17 DIAGNOSIS — E875 Hyperkalemia: Secondary | ICD-10-CM | POA: Diagnosis not present

## 2016-09-19 DIAGNOSIS — N2581 Secondary hyperparathyroidism of renal origin: Secondary | ICD-10-CM | POA: Diagnosis not present

## 2016-09-19 DIAGNOSIS — D631 Anemia in chronic kidney disease: Secondary | ICD-10-CM | POA: Diagnosis not present

## 2016-09-19 DIAGNOSIS — E1129 Type 2 diabetes mellitus with other diabetic kidney complication: Secondary | ICD-10-CM | POA: Diagnosis not present

## 2016-09-19 DIAGNOSIS — N186 End stage renal disease: Secondary | ICD-10-CM | POA: Diagnosis not present

## 2016-09-19 DIAGNOSIS — E875 Hyperkalemia: Secondary | ICD-10-CM | POA: Diagnosis not present

## 2016-09-21 DIAGNOSIS — E875 Hyperkalemia: Secondary | ICD-10-CM | POA: Diagnosis not present

## 2016-09-21 DIAGNOSIS — N2581 Secondary hyperparathyroidism of renal origin: Secondary | ICD-10-CM | POA: Diagnosis not present

## 2016-09-21 DIAGNOSIS — D631 Anemia in chronic kidney disease: Secondary | ICD-10-CM | POA: Diagnosis not present

## 2016-09-21 DIAGNOSIS — E1129 Type 2 diabetes mellitus with other diabetic kidney complication: Secondary | ICD-10-CM | POA: Diagnosis not present

## 2016-09-21 DIAGNOSIS — N186 End stage renal disease: Secondary | ICD-10-CM | POA: Diagnosis not present

## 2016-09-24 ENCOUNTER — Ambulatory Visit: Payer: Self-pay | Admitting: Surgery

## 2016-09-24 DIAGNOSIS — E875 Hyperkalemia: Secondary | ICD-10-CM | POA: Diagnosis not present

## 2016-09-24 DIAGNOSIS — N186 End stage renal disease: Secondary | ICD-10-CM | POA: Diagnosis not present

## 2016-09-24 DIAGNOSIS — N2581 Secondary hyperparathyroidism of renal origin: Secondary | ICD-10-CM | POA: Diagnosis not present

## 2016-09-24 DIAGNOSIS — D631 Anemia in chronic kidney disease: Secondary | ICD-10-CM | POA: Diagnosis not present

## 2016-09-24 DIAGNOSIS — E1129 Type 2 diabetes mellitus with other diabetic kidney complication: Secondary | ICD-10-CM | POA: Diagnosis not present

## 2016-09-26 DIAGNOSIS — N186 End stage renal disease: Secondary | ICD-10-CM | POA: Diagnosis not present

## 2016-09-26 DIAGNOSIS — D631 Anemia in chronic kidney disease: Secondary | ICD-10-CM | POA: Diagnosis not present

## 2016-09-26 DIAGNOSIS — E1129 Type 2 diabetes mellitus with other diabetic kidney complication: Secondary | ICD-10-CM | POA: Diagnosis not present

## 2016-09-26 DIAGNOSIS — E875 Hyperkalemia: Secondary | ICD-10-CM | POA: Diagnosis not present

## 2016-09-26 DIAGNOSIS — N2581 Secondary hyperparathyroidism of renal origin: Secondary | ICD-10-CM | POA: Diagnosis not present

## 2016-09-28 DIAGNOSIS — D631 Anemia in chronic kidney disease: Secondary | ICD-10-CM | POA: Diagnosis not present

## 2016-09-28 DIAGNOSIS — E1129 Type 2 diabetes mellitus with other diabetic kidney complication: Secondary | ICD-10-CM | POA: Diagnosis not present

## 2016-09-28 DIAGNOSIS — N2581 Secondary hyperparathyroidism of renal origin: Secondary | ICD-10-CM | POA: Diagnosis not present

## 2016-09-28 DIAGNOSIS — N186 End stage renal disease: Secondary | ICD-10-CM | POA: Diagnosis not present

## 2016-09-28 DIAGNOSIS — E875 Hyperkalemia: Secondary | ICD-10-CM | POA: Diagnosis not present

## 2016-10-01 DIAGNOSIS — N186 End stage renal disease: Secondary | ICD-10-CM | POA: Diagnosis not present

## 2016-10-01 DIAGNOSIS — D631 Anemia in chronic kidney disease: Secondary | ICD-10-CM | POA: Diagnosis not present

## 2016-10-01 DIAGNOSIS — E1129 Type 2 diabetes mellitus with other diabetic kidney complication: Secondary | ICD-10-CM | POA: Diagnosis not present

## 2016-10-01 DIAGNOSIS — N2581 Secondary hyperparathyroidism of renal origin: Secondary | ICD-10-CM | POA: Diagnosis not present

## 2016-10-01 DIAGNOSIS — E875 Hyperkalemia: Secondary | ICD-10-CM | POA: Diagnosis not present

## 2016-10-03 DIAGNOSIS — N186 End stage renal disease: Secondary | ICD-10-CM | POA: Diagnosis not present

## 2016-10-03 DIAGNOSIS — E875 Hyperkalemia: Secondary | ICD-10-CM | POA: Diagnosis not present

## 2016-10-03 DIAGNOSIS — D631 Anemia in chronic kidney disease: Secondary | ICD-10-CM | POA: Diagnosis not present

## 2016-10-03 DIAGNOSIS — E1129 Type 2 diabetes mellitus with other diabetic kidney complication: Secondary | ICD-10-CM | POA: Diagnosis not present

## 2016-10-03 DIAGNOSIS — N2581 Secondary hyperparathyroidism of renal origin: Secondary | ICD-10-CM | POA: Diagnosis not present

## 2016-10-05 DIAGNOSIS — E875 Hyperkalemia: Secondary | ICD-10-CM | POA: Diagnosis not present

## 2016-10-05 DIAGNOSIS — E1129 Type 2 diabetes mellitus with other diabetic kidney complication: Secondary | ICD-10-CM | POA: Diagnosis not present

## 2016-10-05 DIAGNOSIS — N2581 Secondary hyperparathyroidism of renal origin: Secondary | ICD-10-CM | POA: Diagnosis not present

## 2016-10-05 DIAGNOSIS — D631 Anemia in chronic kidney disease: Secondary | ICD-10-CM | POA: Diagnosis not present

## 2016-10-05 DIAGNOSIS — N186 End stage renal disease: Secondary | ICD-10-CM | POA: Diagnosis not present

## 2016-10-08 DIAGNOSIS — E875 Hyperkalemia: Secondary | ICD-10-CM | POA: Diagnosis not present

## 2016-10-08 DIAGNOSIS — N186 End stage renal disease: Secondary | ICD-10-CM | POA: Diagnosis not present

## 2016-10-08 DIAGNOSIS — D631 Anemia in chronic kidney disease: Secondary | ICD-10-CM | POA: Diagnosis not present

## 2016-10-08 DIAGNOSIS — N2581 Secondary hyperparathyroidism of renal origin: Secondary | ICD-10-CM | POA: Diagnosis not present

## 2016-10-08 DIAGNOSIS — E1129 Type 2 diabetes mellitus with other diabetic kidney complication: Secondary | ICD-10-CM | POA: Diagnosis not present

## 2016-10-10 DIAGNOSIS — N186 End stage renal disease: Secondary | ICD-10-CM | POA: Diagnosis not present

## 2016-10-10 DIAGNOSIS — N2581 Secondary hyperparathyroidism of renal origin: Secondary | ICD-10-CM | POA: Diagnosis not present

## 2016-10-10 DIAGNOSIS — D631 Anemia in chronic kidney disease: Secondary | ICD-10-CM | POA: Diagnosis not present

## 2016-10-10 DIAGNOSIS — E1129 Type 2 diabetes mellitus with other diabetic kidney complication: Secondary | ICD-10-CM | POA: Diagnosis not present

## 2016-10-10 DIAGNOSIS — E875 Hyperkalemia: Secondary | ICD-10-CM | POA: Diagnosis not present

## 2016-10-12 DIAGNOSIS — Z992 Dependence on renal dialysis: Secondary | ICD-10-CM | POA: Diagnosis not present

## 2016-10-12 DIAGNOSIS — I12 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 stage renal disease: Secondary | ICD-10-CM | POA: Diagnosis not present

## 2016-10-12 DIAGNOSIS — E1129 Type 2 diabetes mellitus with other diabetic kidney complication: Secondary | ICD-10-CM | POA: Diagnosis not present

## 2016-10-12 DIAGNOSIS — N2581 Secondary hyperparathyroidism of renal origin: Secondary | ICD-10-CM | POA: Diagnosis not present

## 2016-10-12 DIAGNOSIS — D631 Anemia in chronic kidney disease: Secondary | ICD-10-CM | POA: Diagnosis not present

## 2016-10-12 DIAGNOSIS — N186 End stage renal disease: Secondary | ICD-10-CM | POA: Diagnosis not present

## 2016-10-12 DIAGNOSIS — E875 Hyperkalemia: Secondary | ICD-10-CM | POA: Diagnosis not present

## 2016-10-15 DIAGNOSIS — N2581 Secondary hyperparathyroidism of renal origin: Secondary | ICD-10-CM | POA: Diagnosis not present

## 2016-10-15 DIAGNOSIS — E1129 Type 2 diabetes mellitus with other diabetic kidney complication: Secondary | ICD-10-CM | POA: Diagnosis not present

## 2016-10-15 DIAGNOSIS — Z23 Encounter for immunization: Secondary | ICD-10-CM | POA: Diagnosis not present

## 2016-10-15 DIAGNOSIS — D631 Anemia in chronic kidney disease: Secondary | ICD-10-CM | POA: Diagnosis not present

## 2016-10-15 DIAGNOSIS — E875 Hyperkalemia: Secondary | ICD-10-CM | POA: Diagnosis not present

## 2016-10-15 DIAGNOSIS — N186 End stage renal disease: Secondary | ICD-10-CM | POA: Diagnosis not present

## 2016-10-17 DIAGNOSIS — Z23 Encounter for immunization: Secondary | ICD-10-CM | POA: Diagnosis not present

## 2016-10-17 DIAGNOSIS — D631 Anemia in chronic kidney disease: Secondary | ICD-10-CM | POA: Diagnosis not present

## 2016-10-17 DIAGNOSIS — N2581 Secondary hyperparathyroidism of renal origin: Secondary | ICD-10-CM | POA: Diagnosis not present

## 2016-10-17 DIAGNOSIS — N186 End stage renal disease: Secondary | ICD-10-CM | POA: Diagnosis not present

## 2016-10-17 DIAGNOSIS — E875 Hyperkalemia: Secondary | ICD-10-CM | POA: Diagnosis not present

## 2016-10-17 DIAGNOSIS — E1129 Type 2 diabetes mellitus with other diabetic kidney complication: Secondary | ICD-10-CM | POA: Diagnosis not present

## 2016-10-19 DIAGNOSIS — E875 Hyperkalemia: Secondary | ICD-10-CM | POA: Diagnosis not present

## 2016-10-19 DIAGNOSIS — E1129 Type 2 diabetes mellitus with other diabetic kidney complication: Secondary | ICD-10-CM | POA: Diagnosis not present

## 2016-10-19 DIAGNOSIS — Z23 Encounter for immunization: Secondary | ICD-10-CM | POA: Diagnosis not present

## 2016-10-19 DIAGNOSIS — N186 End stage renal disease: Secondary | ICD-10-CM | POA: Diagnosis not present

## 2016-10-19 DIAGNOSIS — D631 Anemia in chronic kidney disease: Secondary | ICD-10-CM | POA: Diagnosis not present

## 2016-10-19 DIAGNOSIS — N2581 Secondary hyperparathyroidism of renal origin: Secondary | ICD-10-CM | POA: Diagnosis not present

## 2016-10-22 ENCOUNTER — Encounter (HOSPITAL_COMMUNITY): Payer: Self-pay | Admitting: Surgery

## 2016-10-22 DIAGNOSIS — N186 End stage renal disease: Secondary | ICD-10-CM | POA: Diagnosis not present

## 2016-10-22 DIAGNOSIS — E1129 Type 2 diabetes mellitus with other diabetic kidney complication: Secondary | ICD-10-CM | POA: Diagnosis not present

## 2016-10-22 DIAGNOSIS — Z23 Encounter for immunization: Secondary | ICD-10-CM | POA: Diagnosis not present

## 2016-10-22 DIAGNOSIS — E875 Hyperkalemia: Secondary | ICD-10-CM | POA: Diagnosis not present

## 2016-10-22 DIAGNOSIS — N2581 Secondary hyperparathyroidism of renal origin: Secondary | ICD-10-CM | POA: Diagnosis not present

## 2016-10-22 DIAGNOSIS — D631 Anemia in chronic kidney disease: Secondary | ICD-10-CM | POA: Diagnosis not present

## 2016-10-22 NOTE — H&P (Signed)
General Surgery [facility]  [name] DOB: May 04, 1960 Single / Language: [name] / Race: Black or African American Male   History of Present Illness  The patient is a 56 year old male who presents with secondary hyperparathyroidism.  CC: persistent secondary hyperparathyroidism  Patient returns for second postoperative visit. He had undergone neck exploration and 3 parathyroid glands were removed and autotransplantation to the left forearm was performed. However, the fourth parathyroid gland from the right inferior position could not be identified at the time of neck exploration. Postoperatively his laboratory studies have remained elevated with his most recent study showing an intact PTH level of 471, calcium level of 9.2, and phosphorus level of 8.6. Patient returns today for wound check and to discuss further options for management. Patient is on the transplant list at [facility]. He is anxious to get the secondary hyperparathyroidism controlled so that he may proceed with transplantation.   Allergies Diclofenac *ANALGESICS - ANTI-INFLAMMATORY*  Hydrocodone-Acetaminophen *ANALGESICS - OPIOID*  Iron Dextran *CHEMICALS*  OxyCODONE HCl *ANALGESICS - OPIOID*  Allergies Reconciled   Medication History Ibuprofen (800MG  Tablet, Oral) Active. OxyCODONE HCl (5MG  Tablet, Oral as needed) Active. Betamethasone Dipropionate Aug (0.05% Ointment, External) Active. Sensipar (90MG  Tablet, Oral) Active. Gabapentin (100MG  Capsule, Oral) Active. Atorvastatin Calcium (10MG  Tablet, Oral) Active. Calcium Acetate (Phos Binder) (667MG  Capsule, Oral) Active. Folic Acid-Vit J8-HUD J49 (0.4-50-0.1MG  Tablet, Oral) Active. Lanthanum Carbonate (1000MG  Packet, Oral) Active. Sorbitol (70% Solution,) Active. Colace (100MG  Capsule, Oral) Active. Aspirin (81MG  Tablet, Oral) Active. Medications Reconciled  Vitals  Weight: 191 lb  Height: 73in Body Surface Area: 2.11 m Body Mass Index: 25.2 kg/m  Temp.: 98.17F(Temporal)  Pulse: 95 (Regular)  BP: 102/78 (Sitting, Left Arm, Standard)    Physical Exam The physical exam findings are as follows: Note:Limited examination  Anterior cervical incision is well-healed. No soft tissue swelling or evidence of infection. Voice quality is normal.  Left forearm incision is well-healed. Palpable suture material and the subcutaneous tissues. No sign of seroma or infection.    Assessment & Plan  SECONDARY HYPERPARATHYROIDISM, RENAL (N25.81)  Follow Up - Call [facility] office after tests / studies doneto discuss further plans  Patient has persistent secondary hyperparathyroidism following neck exploration and autotransplantation. The right inferior parathyroid gland was not identified at the time of neck exploration. It may be ectopic. Therefore we are going to proceed with localization studies. We will order a nuclear medicine parathyroid scan in hopes of identifying the remaining parathyroid gland. If this localizes the gland, then we will attempt resection. If the sestamibi scan is negative, then we will obtain a 4D CT scan of the neck and chest in hopes of identifying an ectopic parathyroid gland.  Patient understands and agrees to proceed with further evaluation.   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  Will plan for neck exploration and parathyroidectomy.  The risks and benefits of the procedure have been discussed at length with the patient.  The patient understands the proposed procedure, potential alternative treatments, and the course of recovery to be expected.  All of the patient's questions have been answered at this time.  The patient wishes to proceed with surgery.  [name], MD, [facility] Office: [phone]

## 2016-10-22 NOTE — Progress Notes (Signed)
Pt denies SOB, chest pain, and being under the care of a cardiologist. Pt denies having a cardiac cath. Pt stated that last dose of Aspirin was prior to " last Tuesday." Pt made aware to stop taking vitamins, fish oil and herbal medications. Do not take any NSAIDs ie: Ibuprofen, Advil, Naproxen (Aleve), Motrin, BC and Goody Powder or any medication containing Aspirin. Pt verbalized understanding of all pre-op instructions.

## 2016-10-23 ENCOUNTER — Observation Stay (HOSPITAL_COMMUNITY)
Admission: RE | Admit: 2016-10-23 | Discharge: 2016-10-24 | Disposition: A | Discharge status: Home / Self Care | Payer: Medicare Other | Source: Ambulatory Visit | Attending: Surgery | Admitting: Surgery

## 2016-10-23 ENCOUNTER — Ambulatory Visit (HOSPITAL_COMMUNITY): Payer: Medicare Other | Admitting: Anesthesiology

## 2016-10-23 ENCOUNTER — Encounter (HOSPITAL_COMMUNITY): Payer: Self-pay | Admitting: *Deleted

## 2016-10-23 ENCOUNTER — Encounter (HOSPITAL_COMMUNITY)
Admission: RE | Disposition: A | Discharge status: Home / Self Care | Payer: Self-pay | Source: Ambulatory Visit | Attending: Surgery

## 2016-10-23 DIAGNOSIS — N186 End stage renal disease: Secondary | ICD-10-CM | POA: Insufficient documentation

## 2016-10-23 DIAGNOSIS — K219 Gastro-esophageal reflux disease without esophagitis: Secondary | ICD-10-CM | POA: Diagnosis not present

## 2016-10-23 DIAGNOSIS — J0410 Acute tracheitis without obstruction: Secondary | ICD-10-CM | POA: Diagnosis not present

## 2016-10-23 DIAGNOSIS — D631 Anemia in chronic kidney disease: Secondary | ICD-10-CM | POA: Diagnosis not present

## 2016-10-23 DIAGNOSIS — Z87891 Personal history of nicotine dependence: Secondary | ICD-10-CM | POA: Insufficient documentation

## 2016-10-23 DIAGNOSIS — I12 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 stage renal disease: Secondary | ICD-10-CM | POA: Insufficient documentation

## 2016-10-23 DIAGNOSIS — G4730 Sleep apnea, unspecified: Secondary | ICD-10-CM | POA: Insufficient documentation

## 2016-10-23 DIAGNOSIS — Z7982 Long term (current) use of aspirin: Secondary | ICD-10-CM | POA: Insufficient documentation

## 2016-10-23 DIAGNOSIS — N2581 Secondary hyperparathyroidism of renal origin: Principal | ICD-10-CM | POA: Diagnosis present

## 2016-10-23 DIAGNOSIS — D649 Anemia, unspecified: Secondary | ICD-10-CM | POA: Diagnosis not present

## 2016-10-23 DIAGNOSIS — E049 Nontoxic goiter, unspecified: Secondary | ICD-10-CM | POA: Diagnosis not present

## 2016-10-23 DIAGNOSIS — E214 Other specified disorders of parathyroid gland: Secondary | ICD-10-CM | POA: Diagnosis not present

## 2016-10-23 HISTORY — PX: PARATHYROIDECTOMY: SHX19

## 2016-10-23 HISTORY — DX: Disorder of parathyroid gland, unspecified: E21.5

## 2016-10-23 HISTORY — DX: End stage renal disease: N18.6

## 2016-10-23 LAB — CBC
HCT: 37.2 % — ABNORMAL LOW (ref 39.0–52.0)
Hemoglobin: 11.7 g/dL — ABNORMAL LOW (ref 13.0–17.0)
MCH: 27.5 pg (ref 26.0–34.0)
MCHC: 31.5 g/dL (ref 30.0–36.0)
MCV: 87.5 fL (ref 78.0–100.0)
Platelets: 252 10*3/uL (ref 150–400)
RBC: 4.25 MIL/uL (ref 4.22–5.81)
RDW: 18.3 % — ABNORMAL HIGH (ref 11.5–15.5)
WBC: 6 10*3/uL (ref 4.0–10.5)

## 2016-10-23 LAB — BASIC METABOLIC PANEL
Anion gap: 13 (ref 5–15)
BUN: 42 mg/dL — ABNORMAL HIGH (ref 6–20)
CO2: 26 mmol/L (ref 22–32)
Calcium: 9.4 mg/dL (ref 8.9–10.3)
Chloride: 96 mmol/L — ABNORMAL LOW (ref 101–111)
Creatinine, Ser: 8.91 mg/dL — ABNORMAL HIGH (ref 0.61–1.24)
GFR calc Af Amer: 7 mL/min — ABNORMAL LOW (ref >60)
GFR calc non Af Amer: 6 mL/min — ABNORMAL LOW (ref >60)
Glucose, Bld: 83 mg/dL (ref 65–99)
Potassium: 5 mmol/L (ref 3.5–5.1)
Sodium: 135 mmol/L (ref 135–145)

## 2016-10-23 LAB — NO BLOOD PRODUCTS

## 2016-10-23 SURGERY — PARATHYROIDECTOMY
Anesthesia: General

## 2016-10-23 MED ORDER — LIDOCAINE 2% (20 MG/ML) 5 ML SYRINGE
INTRAMUSCULAR | Status: AC
  Filled 2016-10-23: qty 5

## 2016-10-23 MED ORDER — HYDROMORPHONE HCL 1 MG/ML IJ SOLN
1.0000 mg | INTRAMUSCULAR | Status: DC | PRN
  Administered 2016-10-23 (×3): 1 mg via INTRAVENOUS
  Filled 2016-10-23 (×2): qty 1

## 2016-10-23 MED ORDER — BUPIVACAINE HCL (PF) 0.25 % IJ SOLN
INTRAMUSCULAR | Status: DC | PRN
Start: 2016-10-23 — End: 2016-10-23
  Administered 2016-10-23: 10 mL

## 2016-10-23 MED ORDER — HYDROMORPHONE HCL 1 MG/ML IJ SOLN
INTRAMUSCULAR | Status: AC
  Filled 2016-10-23: qty 1

## 2016-10-23 MED ORDER — NEOSTIGMINE METHYLSULFATE 10 MG/10ML IV SOLN
INTRAVENOUS | Status: DC | PRN
  Administered 2016-10-23: 3 mg via INTRAVENOUS

## 2016-10-23 MED ORDER — CEFAZOLIN SODIUM-DEXTROSE 2-4 GM/100ML-% IV SOLN
2.0000 g | INTRAVENOUS | Status: AC
  Administered 2016-10-23: 2 g via INTRAVENOUS
  Filled 2016-10-23: qty 100

## 2016-10-23 MED ORDER — FENTANYL CITRATE (PF) 100 MCG/2ML IJ SOLN
INTRAMUSCULAR | Status: DC | PRN
  Administered 2016-10-23: 50 ug via INTRAVENOUS
  Administered 2016-10-23: 150 ug via INTRAVENOUS
  Administered 2016-10-23 (×3): 50 ug via INTRAVENOUS

## 2016-10-23 MED ORDER — BUPIVACAINE HCL (PF) 0.5 % IJ SOLN
INTRAMUSCULAR | Status: AC
  Filled 2016-10-23: qty 30

## 2016-10-23 MED ORDER — ROCURONIUM BROMIDE 100 MG/10ML IV SOLN
INTRAVENOUS | Status: DC | PRN
  Administered 2016-10-23: 20 mg via INTRAVENOUS
  Administered 2016-10-23: 40 mg via INTRAVENOUS

## 2016-10-23 MED ORDER — SODIUM CHLORIDE 0.9 % IV SOLN
INTRAVENOUS | Status: DC
  Administered 2016-10-23: 08:00:00 via INTRAVENOUS

## 2016-10-23 MED ORDER — PROMETHAZINE HCL 25 MG/ML IJ SOLN: 6.2500 mg | INTRAMUSCULAR | Status: DC | PRN

## 2016-10-23 MED ORDER — 0.9 % SODIUM CHLORIDE (POUR BTL) OPTIME
TOPICAL | Status: DC | PRN
  Administered 2016-10-23: 1000 mL

## 2016-10-23 MED ORDER — TRAMADOL HCL 50 MG PO TABS
ORAL_TABLET | ORAL | Status: AC
  Filled 2016-10-23: qty 1

## 2016-10-23 MED ORDER — TRAMADOL HCL 50 MG PO TABS
50.0000 mg | ORAL_TABLET | Freq: Four times a day (QID) | ORAL | Status: DC | PRN
Start: 2016-10-23 — End: 2016-10-24
  Administered 2016-10-23 – 2016-10-24 (×2): 50 mg via ORAL
  Filled 2016-10-23: qty 1

## 2016-10-23 MED ORDER — ONDANSETRON HCL 4 MG/2ML IJ SOLN: 4.0000 mg | Freq: Four times a day (QID) | INTRAMUSCULAR | Status: DC | PRN

## 2016-10-23 MED ORDER — LIDOCAINE HCL (CARDIAC) 20 MG/ML IV SOLN
INTRAVENOUS | Status: DC | PRN
  Administered 2016-10-23: 60 mg via INTRAVENOUS

## 2016-10-23 MED ORDER — DOXERCALCIFEROL 4 MCG/2ML IV SOLN
2.0000 ug | INTRAVENOUS | Status: DC
  Administered 2016-10-24: 2 ug via INTRAVENOUS
  Filled 2016-10-23: qty 2

## 2016-10-23 MED ORDER — FENTANYL CITRATE (PF) 250 MCG/5ML IJ SOLN
INTRAMUSCULAR | Status: AC
  Filled 2016-10-23: qty 5

## 2016-10-23 MED ORDER — ONDANSETRON 4 MG PO TBDP: 4.0000 mg | ORAL_TABLET | Freq: Four times a day (QID) | ORAL | Status: DC | PRN

## 2016-10-23 MED ORDER — DEXAMETHASONE SODIUM PHOSPHATE 10 MG/ML IJ SOLN
INTRAMUSCULAR | Status: AC
  Filled 2016-10-23: qty 1

## 2016-10-23 MED ORDER — HEMOSTATIC AGENTS (NO CHARGE) OPTIME
TOPICAL | Status: DC | PRN
  Administered 2016-10-23: 1 via TOPICAL

## 2016-10-23 MED ORDER — ROCURONIUM BROMIDE 10 MG/ML (PF) SYRINGE
PREFILLED_SYRINGE | INTRAVENOUS | Status: AC
  Filled 2016-10-23: qty 5

## 2016-10-23 MED ORDER — CALCIUM CARBONATE 1250 (500 CA) MG PO TABS
1250.0000 mg | ORAL_TABLET | Freq: Three times a day (TID) | ORAL | Status: DC
  Administered 2016-10-23 – 2016-10-24 (×2): 1250 mg via ORAL
  Filled 2016-10-23 (×2): qty 1

## 2016-10-23 MED ORDER — DIPHENHYDRAMINE HCL 12.5 MG/5ML PO ELIX
ORAL_SOLUTION | ORAL | Status: AC
  Administered 2016-10-23: 25 mg
  Filled 2016-10-23: qty 10

## 2016-10-23 MED ORDER — PROPOFOL 10 MG/ML IV BOLUS
INTRAVENOUS | Status: DC | PRN
  Administered 2016-10-23: 200 mg via INTRAVENOUS

## 2016-10-23 MED ORDER — SODIUM CHLORIDE 0.9 % IV SOLN: INTRAVENOUS | Status: DC

## 2016-10-23 MED ORDER — DIPHENHYDRAMINE HCL 25 MG PO CAPS: 25.0000 mg | ORAL_CAPSULE | Freq: Once | ORAL | Status: DC

## 2016-10-23 MED ORDER — CHLORHEXIDINE GLUCONATE CLOTH 2 % EX PADS: 6.0000 | MEDICATED_PAD | Freq: Once | CUTANEOUS | Status: DC

## 2016-10-23 MED ORDER — ACETAMINOPHEN 650 MG RE SUPP: 650.0000 mg | Freq: Four times a day (QID) | RECTAL | Status: DC | PRN

## 2016-10-23 MED ORDER — PHENYLEPHRINE HCL 10 MG/ML IJ SOLN
INTRAMUSCULAR | Status: DC | PRN
  Administered 2016-10-23: 120 ug via INTRAVENOUS
  Administered 2016-10-23: 40 ug via INTRAVENOUS

## 2016-10-23 MED ORDER — MIDAZOLAM HCL 2 MG/2ML IJ SOLN
INTRAMUSCULAR | Status: AC
  Filled 2016-10-23: qty 2

## 2016-10-23 MED ORDER — GLYCOPYRROLATE 0.2 MG/ML IJ SOLN
INTRAMUSCULAR | Status: DC | PRN
  Administered 2016-10-23: 0.4 mg via INTRAVENOUS

## 2016-10-23 MED ORDER — ONDANSETRON HCL 4 MG/2ML IJ SOLN
INTRAMUSCULAR | Status: DC | PRN
  Administered 2016-10-23: 4 mg via INTRAVENOUS

## 2016-10-23 MED ORDER — ONDANSETRON HCL 4 MG/2ML IJ SOLN
INTRAMUSCULAR | Status: AC
  Filled 2016-10-23: qty 2

## 2016-10-23 MED ORDER — PROPOFOL 10 MG/ML IV BOLUS
INTRAVENOUS | Status: AC
  Filled 2016-10-23: qty 20

## 2016-10-23 MED ORDER — MIDAZOLAM HCL 5 MG/5ML IJ SOLN
INTRAMUSCULAR | Status: DC | PRN
Start: 2016-10-23 — End: 2016-10-23
  Administered 2016-10-23: 2 mg via INTRAVENOUS

## 2016-10-23 MED ORDER — FENTANYL CITRATE (PF) 100 MCG/2ML IJ SOLN: 25.0000 ug | INTRAMUSCULAR | Status: DC | PRN

## 2016-10-23 MED ORDER — GABAPENTIN 100 MG PO CAPS
100.0000 mg | ORAL_CAPSULE | Freq: Every day | ORAL | Status: DC
Start: 2016-10-23 — End: 2016-10-24
  Administered 2016-10-23 – 2016-10-24 (×2): 100 mg via ORAL
  Filled 2016-10-23 (×2): qty 1

## 2016-10-23 MED ORDER — NEOSTIGMINE METHYLSULFATE 5 MG/5ML IV SOSY
PREFILLED_SYRINGE | INTRAVENOUS | Status: AC
  Filled 2016-10-23: qty 5

## 2016-10-23 MED ORDER — DEXAMETHASONE SODIUM PHOSPHATE 10 MG/ML IJ SOLN
INTRAMUSCULAR | Status: DC | PRN
  Administered 2016-10-23: 10 mg via INTRAVENOUS

## 2016-10-23 MED ORDER — ACETAMINOPHEN 325 MG PO TABS: 650.0000 mg | ORAL_TABLET | Freq: Four times a day (QID) | ORAL | Status: DC | PRN

## 2016-10-23 SURGICAL SUPPLY — 54 items
ATTRACTOMAT 16X20 MAGNETIC DRP (DRAPES) ×2 IMPLANT
BLADE SURG 15 STRL LF DISP TIS (BLADE) ×1 IMPLANT
BLADE SURG 15 STRL SS (BLADE) ×2
CANISTER SUCT 3000ML PPV (MISCELLANEOUS) ×2 IMPLANT
CHLORAPREP W/TINT 26ML (MISCELLANEOUS) ×2 IMPLANT
CLIP TI WIDE RED SMALL 6 (CLIP) ×1 IMPLANT
CLIP VESOCCLUDE MED 6/CT (CLIP) ×2 IMPLANT
CLIP VESOCCLUDE SM WIDE 6/CT (CLIP) ×2 IMPLANT
CONT SPEC 4OZ CLIKSEAL STRL BL (MISCELLANEOUS) ×3 IMPLANT
COVER SURGICAL LIGHT HANDLE (MISCELLANEOUS) ×2 IMPLANT
CRADLE DONUT ADULT HEAD (MISCELLANEOUS) ×2 IMPLANT
DRAPE LAPAROTOMY 100X72 PEDS (DRAPES) ×2 IMPLANT
DRAPE UTILITY XL STRL (DRAPES) ×2 IMPLANT
ELECT CAUTERY BLADE 6.4 (BLADE) ×2 IMPLANT
ELECT REM PT RETURN 9FT ADLT (ELECTROSURGICAL) ×2
ELECTRODE REM PT RTRN 9FT ADLT (ELECTROSURGICAL) ×1 IMPLANT
GAUZE SPONGE 2X2 8PLY STRL LF (GAUZE/BANDAGES/DRESSINGS) ×1 IMPLANT
GAUZE SPONGE 4X4 12PLY STRL (GAUZE/BANDAGES/DRESSINGS) ×1 IMPLANT
GAUZE SPONGE 4X4 16PLY XRAY LF (GAUZE/BANDAGES/DRESSINGS) ×2 IMPLANT
GLOVE BIOGEL PI IND STRL 7.5 (GLOVE) IMPLANT
GLOVE BIOGEL PI INDICATOR 7.5 (GLOVE) ×1
GLOVE ECLIPSE 7.5 STRL STRAW (GLOVE) ×1 IMPLANT
GLOVE SURG ORTHO 8.0 STRL STRW (GLOVE) ×2 IMPLANT
GOWN STRL REUS W/ TWL LRG LVL3 (GOWN DISPOSABLE) ×1 IMPLANT
GOWN STRL REUS W/ TWL XL LVL3 (GOWN DISPOSABLE) ×1 IMPLANT
GOWN STRL REUS W/TWL LRG LVL3 (GOWN DISPOSABLE) ×2
GOWN STRL REUS W/TWL XL LVL3 (GOWN DISPOSABLE) ×2
HEMOSTAT SURGICEL 2X4 FIBR (HEMOSTASIS) ×3 IMPLANT
ILLUMINATOR WAVEGUIDE N/F (MISCELLANEOUS) ×2 IMPLANT
KIT BASIN OR (CUSTOM PROCEDURE TRAY) ×2 IMPLANT
KIT ROOM TURNOVER OR (KITS) ×2 IMPLANT
NDL HYPO 25GX1X1/2 BEV (NEEDLE) ×1 IMPLANT
NEEDLE HYPO 25GX1X1/2 BEV (NEEDLE) ×2 IMPLANT
NS IRRIG 1000ML POUR BTL (IV SOLUTION) ×2 IMPLANT
PACK SURGICAL SETUP 50X90 (CUSTOM PROCEDURE TRAY) ×2 IMPLANT
PAD ARMBOARD 7.5X6 YLW CONV (MISCELLANEOUS) ×2 IMPLANT
PENCIL BUTTON HOLSTER BLD 10FT (ELECTRODE) ×2 IMPLANT
SHEARS HARMONIC 9CM CVD (BLADE) ×1 IMPLANT
SPONGE GAUZE 2X2 STER 10/PKG (GAUZE/BANDAGES/DRESSINGS) ×1
SPONGE INTESTINAL PEANUT (DISPOSABLE) IMPLANT
STRIP CLOSURE SKIN 1/2X4 (GAUZE/BANDAGES/DRESSINGS) ×3 IMPLANT
SUT MNCRL AB 4-0 PS2 18 (SUTURE) ×2 IMPLANT
SUT SILK 2 0 (SUTURE)
SUT SILK 2-0 18XBRD TIE 12 (SUTURE) IMPLANT
SUT SILK 3 0 (SUTURE)
SUT SILK 3-0 18XBRD TIE 12 (SUTURE) IMPLANT
SUT VIC AB 3-0 SH 18 (SUTURE) ×2 IMPLANT
SUT VIC AB 3-0 SH 8-18 (SUTURE) ×1 IMPLANT
SYR BULB 3OZ (MISCELLANEOUS) ×2 IMPLANT
SYR BULB IRRIGATION 50ML (SYRINGE) ×1 IMPLANT
SYR CONTROL 10ML LL (SYRINGE) ×2 IMPLANT
TOWEL OR 17X24 6PK STRL BLUE (TOWEL DISPOSABLE) ×2 IMPLANT
TOWEL OR 17X26 10 PK STRL BLUE (TOWEL DISPOSABLE) ×2 IMPLANT
TUBE CONNECTING 12X1/4 (SUCTIONS) ×2 IMPLANT

## 2016-10-23 NOTE — Progress Notes (Signed)
I spoke with patient about blood refusal because his answer is vague about if he wants to receive blood.    I clarified with patient that he only wants to receive blood "if he is going to die.  He is beginning to practice Je[employer].  I spoke with blood bank who stated that we still need to sign a blood refusal form and that we need to print on it that he only wants blood if he is going to die.  Patient signed blood refusal form and initialed where printed that he only wants blood if he is going to die

## 2016-10-23 NOTE — Anesthesia Preprocedure Evaluation (Addendum)
Anesthesia Evaluation  Patient identified by MRN, date of birth, ID band Patient awake    Reviewed: Allergy & Precautions, H&P , NPO status , Patient's Chart, lab work & pertinent test results  History of Anesthesia Complications Negative for: history of anesthetic complications  Airway Mallampati: III  TM Distance: >3 FB Neck ROM: Full    Dental no notable dental hx. (+) Dental Advisory Given, Partial Upper   Pulmonary sleep apnea and Continuous Positive Airway Pressure Ventilation , former smoker,    Pulmonary exam normal        Cardiovascular hypertension, Pt. on medications Normal cardiovascular exam     Neuro/Psych  Headaches, CVA negative psych ROS   GI/Hepatic Neg liver ROS, GERD  Medicated and Controlled,  Endo/Other  negative endocrine ROS  Renal/GU ESRF and DialysisRenal disease  negative genitourinary   Musculoskeletal   Abdominal   Peds  Hematology negative hematology ROS (+) anemia ,   Anesthesia Other Findings   Reproductive/Obstetrics negative OB ROS                            Lab Results  Component Value Date   WBC 6.0 10/23/2016   HGB 11.7 (L) 10/23/2016   HCT 37.2 (L) 10/23/2016   MCV 87.5 10/23/2016   PLT 252 10/23/2016   Lab Results  Component Value Date   CREATININE 8.91 (H) 10/23/2016   BUN 42 (H) 10/23/2016   NA 135 10/23/2016   K 5.0 10/23/2016   CL 96 (L) 10/23/2016   CO2 26 10/23/2016    Anesthesia Physical  Anesthesia Plan  ASA: III  Anesthesia Plan: General   Post-op Pain Management:    Induction: Intravenous  PONV Risk Score and Plan: 2 and Ondansetron and Dexamethasone  Airway Management Planned: Oral ETT  Additional Equipment:   Intra-op Plan:   Post-operative Plan: Extubation in OR  Informed Consent: I have reviewed the patients History and Physical, chart, labs and discussed the procedure including the risks, benefits and  alternatives for the proposed anesthesia with the patient or authorized representative who has indicated his/her understanding and acceptance.   Dental advisory given  Plan Discussed with: Anesthesiologist and CRNA  Anesthesia Plan Comments:        Anesthesia Quick Evaluation

## 2016-10-23 NOTE — Transfer of Care (Signed)
Immediate Anesthesia Transfer of Care Note  Patient: Carl Found Sr.  Procedure(s) Performed: Procedure(s): PARATHYROIDECTOMY (N/A)  Patient Location: PACU  Anesthesia Type:General  Level of Consciousness: awake, alert , oriented and patient cooperative  Airway & Oxygen Therapy: Patient Spontanous Breathing and Patient connected to nasal cannula oxygen  Post-op Assessment: Report given to RN and Post -op Vital signs reviewed and stable  Post vital signs: Reviewed and stable  Last Vitals:  Vitals:   10/23/16 0720  BP: 114/74  Pulse: 88  Resp: 20  Temp: 37.2 C  SpO2: 100%    Last Pain:  Vitals:   10/23/16 0720  TempSrc: Oral      Patients Stated Pain Goal: 3 (86/76/19 5093)  Complications: No apparent anesthesia complications

## 2016-10-23 NOTE — Anesthesia Postprocedure Evaluation (Signed)
Anesthesia Post Note  Patient: Tracker Mance Sr.  Procedure(s) Performed: Procedure(s) (LRB): PARATHYROIDECTOMY (N/A)     Patient location during evaluation: PACU Anesthesia Type: General Level of consciousness: awake and alert Pain management: pain level controlled Vital Signs Assessment: post-procedure vital signs reviewed and stable Respiratory status: spontaneous breathing, nonlabored ventilation, respiratory function stable and patient connected to nasal cannula oxygen Cardiovascular status: blood pressure returned to baseline and stable Postop Assessment: no signs of nausea or vomiting Anesthetic complications: no    Last Vitals:  Vitals:   10/23/16 1350 10/23/16 1405  BP: 128/83   Pulse: 77   Resp: (!) 8   Temp:  (!) 36.3 C  SpO2: 100%     Last Pain:  Vitals:   10/23/16 1405  TempSrc:   PainSc: 5                  Tiajuana Amass

## 2016-10-23 NOTE — Anesthesia Procedure Notes (Signed)
Procedure Name: Intubation Date/Time: 10/23/2016 10:24 AM Performed by: Candis Shine Pre-anesthesia Checklist: Patient identified, Emergency Drugs available, Suction available and Patient being monitored Patient Re-evaluated:Patient Re-evaluated prior to induction Oxygen Delivery Method: Circle System Utilized Preoxygenation: Pre-oxygenation with 100% oxygen Induction Type: IV induction Ventilation: Mask ventilation without difficulty Laryngoscope Size: Mac and 4 Grade View: Grade II Tube type: Oral Tube size: 7.5 mm Number of attempts: 1 Airway Equipment and Method: Stylet Placement Confirmation: ETT inserted through vocal cords under direct vision,  positive ETCO2 and breath sounds checked- equal and bilateral Secured at: 22 cm Tube secured with: Tape Dental Injury: Teeth and Oropharynx as per pre-operative assessment

## 2016-10-23 NOTE — Consult Note (Signed)
[facility] Renal Consultation Note  Indication for Consultation:  Management of ESRD/hemodialysis; anemia, hypertension/volume and secondary hyperparathyroidism  HPI: Carl Choyce Sr. is a 56 y.o. 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 Harlow Asa. Only 3 glands found. Persistent  Sec. HPT now admitted post op Dr. Harlow Asa  Neck exploration, thyroid isthmusectomy.He had Sestamibi scan is positive for ectopic parathyroid gland with Wu by Dr. Harlow Asa  Last Hd on schedule yesterday  , recent pth 998 (10/17/16)< (772 on 10/10/16)  Ca 8.6  Phos 7.8  On 10/17/16 .  Now post op  Minimal neck pain     Past Medical History:  Diagnosis Date  . Anemia   . Arthritis   . Chronic headaches   . Chronic kidney disease    Chronic Renal Failure; On Renal Transplant List, ESRD on HD  . Diverticulitis   . GI bleed   . Hemodialysis patient ([place])    M-W-F  . Hypertension   . Parathyroid abnormality ([facility])    ectopic parathyroid gland  . Presence of arteriovenous fistula for hemodialysis, primary ([place])    RUE  . Refusal of blood product    NO WHOLE BLOOD PROUCTS  . Secondary hyperparathyroidism ([place])   . Seizures ([place])    one episode in past  . Sleep apnea    doesn't use CPAP anymore since weight loss  . Stroke [facility])    no residual    Past Surgical History:  Procedure Laterality Date  . AV FISTULA PLACEMENT Right    right arm  . CATARACT EXTRACTION W/ INTRAOCULAR LENS  IMPLANT, BILATERAL    . COLONOSCOPY N/A 08/04/2015   Procedure: COLONOSCOPY;  Surgeon: Jerene Bears, MD;  Location: [facility] ENDOSCOPY;  Service: Endoscopy;  Laterality: N/A;  . graft left arm Left    for dialysis x 2  . INSERTION OF DIALYSIS CATHETER     Rt chest  . LAPAROSCOPIC RIGHT COLECTOMY N/A 08/05/2015   Procedure: LAPAROSCOPIC RIGHT COLECTOMY- ASCENDING;   Surgeon: Stark Klein, MD;  Location: [place];  Service: General;  Laterality: N/A;  . PARATHYROIDECTOMY  06/12/2016  . PARATHYROIDECTOMY N/A 06/12/2016   Procedure: TOTAL PARATHYROIDECTOMY WITH AUTOTRANSPLANTATION TO LEFT FOREARM;  Surgeon: Armandina Gemma, MD;  [facility];  Service: General;  Laterality: N/A;      Family History  Problem Relation Age of Onset  . Diabetes Father   . Stroke Father   . Hypertension Father   . Uterine cancer Mother   . Lupus Sister   . Stroke Sister   . Hypertension Sister   . Anuerysm Brother        brain      reports that he quit smoking about 26 years ago. He has never used smokeless tobacco. He reports that he drinks about 0.6 oz of alcohol per week . He reports that he does not use drugs.   Allergies  Allergen Reactions  . Infed [Iron Dextran] Other (See Comments)    Decreased BP   . Oxycodone Nausea Only  . Vicodin [Hydrocodone-Acetaminophen] Other (See Comments)    Decreased BP  . Diclofenac Other (See Comments)    Unknown  . Whole Blood Other (See Comments)    Patient refuses whole blood "unless I am going to die"    Prior to Admission medications   Medication Sig Start Date End  Date Taking? Authorizing Provider  aspirin EC 81 MG tablet Take 1 tablet (81 mg total) by mouth daily. Patient taking differently: Take 81 mg by mouth every other day.  08/26/14  Yes Rosalin Hawking, MD  cinacalcet (SENSIPAR) 90 MG tablet Take 180 mg by mouth daily.   Yes [provider]  gabapentin (NEURONTIN) 100 MG capsule Take 100 mg by mouth daily.  09/29/14  Yes [provider]  ibuprofen (ADVIL,MOTRIN) 800 MG tablet Take 800 mg by mouth 3 (three) times daily as needed for mild pain.  10/14/15  Yes [provider]  multivitamin (RENA-VIT) TABS tablet Take 1 tablet by mouth daily.   Yes [provider]  atorvastatin (LIPITOR) 10 MG tablet Take 1 tablet (10 mg total) by mouth daily at 6 PM. Patient not taking: Reported on 10/19/2016  05/25/14   Thurnell Lose, MD  butalbital-acetaminophen-caffeine (FIORICET, ESGIC) [id number] MG tablet Take 1 tablet by mouth every 12 (twelve) hours as needed for headache. No more than 2 tabs a day and no more than 5 tabs a week. Patient not taking: Reported on 10/19/2016 01/17/16   Garvin Fila, MD  docusate sodium (COLACE) 100 MG capsule Take 1 capsule (100 mg total) by mouth 2 (two) times daily as needed for mild constipation. Patient not taking: Reported on 10/19/2016 08/10/15   Lavina Hamman, MD  omeprazole (PRILOSEC) 20 MG capsule Take 1 capsule (20 mg total) by mouth daily. 30-60 mins before breakfast. Patient not taking: Reported on 10/19/2016 12/15/15   Levin Erp, PA  sorbitol 70 % solution Take 15-60 mLs by mouth daily as needed ((do not take on/before dialysis days)).     [provider]      Results for orders placed or performed during the hospital encounter of 10/23/16 (from the past 48 hour(s))  CBC     Status: Abnormal   Collection Time: 10/23/16  7:29 AM  Result Value Ref Range   WBC 6.0 4.0 - 10.5 K/uL   RBC 4.25 4.22 - 5.81 MIL/uL   Hemoglobin 11.7 (L) 13.0 - 17.0 g/dL   HCT 37.2 (L) 39.0 - 52.0 %   MCV 87.5 78.0 - 100.0 fL   MCH 27.5 26.0 - 34.0 pg   MCHC 31.5 30.0 - 36.0 g/dL   RDW 18.3 (H) 11.5 - 15.5 %   Platelets 252 150 - 400 K/uL  Basic metabolic panel     Status: Abnormal   Collection Time: 10/23/16  7:29 AM  Result Value Ref Range   Sodium 135 135 - 145 mmol/L   Potassium 5.0 3.5 - 5.1 mmol/L    Comment: SLIGHT HEMOLYSIS   Chloride 96 (L) 101 - 111 mmol/L   CO2 26 22 - 32 mmol/L   Glucose, Bld 83 65 - 99 mg/dL   BUN 42 (H) 6 - 20 mg/dL   Creatinine, Ser 8.91 (H) 0.61 - 1.24 mg/dL   Calcium 9.4 8.9 - 10.3 mg/dL   GFR calc non Af Amer 6 (L) >60 mL/min   GFR calc Af Amer 7 (L) >60 mL/min    Comment: (NOTE) The eGFR has been calculated using the CKD EPI equation. This calculation has not been validated in all clinical  situations. eGFR's persistently <60 mL/min signify possible Chronic Kidney Disease.    Anion gap 13 5 - 15  No blood products     Status: None   Collection Time: 10/23/16  8:00 AM  Result Value Ref Range   Transfuse  no blood products      TRANSFUSE NO BLOOD PRODUCTS, VERIFIED BY JESSICA HAZLIP,RN    ROS: no positives on ROS   Physical Exam: Vitals:   10/23/16 1250 10/23/16 1305  BP: (!) 142/90 122/84  Pulse: 68 77  Resp: (!) 8 18  Temp: 97.7 F (36.5 C)   SpO2: 100% 100%     General: alert Ox3 , WD WN AAM NAD HEENT: [place] , EOMI , Anicteric , Anterior Neck surgical bandage dry / clean Fundi unremarkable Heart: RRR with 1/6 sem , no rub S4 Lungs: CTA  bialat , un labored breathing  Abdomen: BS pos , soft ,Nt, ND Extremities: no -pedal edema  Skin: warm dry , no oivert rash or pedal ulcers  Neuro: Alert moves all extrem , no overt focal deficits  Dialysis Access: pos bruit  R FA AVF  Dialysis Orders: Center: [facility]w  on MWF  . EDW 85.5 HD Bath 1.0 k, 2ca  Time 4hr 38mn Heparin 5000  Access RFA AVF      Hec 2 mcg IV/HD    ( No ESA last OP HGB 11.4  10/17/16 )  Venof[place]  568mweekly hd     Assessment/Plan 1. ESRD -  HD  MWF ,use no hep post op . 2. Persistent secondary hyperparathyroidism sp Neck exploration, thyroid isthmusectomy- fu labs / can dc Sensipar , use ^ ca bath , po ca  And iv Vit d on hd    Ca 9.4 pre op  3. Hypertension/volume  - no current BP Meds / HD uf 2 l in am  4. Anemia  - Fe weekly hd  hgb 11.7 no esa  Needed / Jehovah WiDurene RomansPA-C [phone]/12/2016, 1:52 PM I have seen and examined this patient and agree with the plan of care seen, eval, counseled, discussed with Dr. GeHarlow Asaand PA .  Pinkey Mcjunkin L 10/23/2016, 3:14 PM

## 2016-10-23 NOTE — Interval H&P Note (Signed)
History and Physical Interval Note:  10/23/2016 8:59 AM  Carl Found Sr.  has presented today for surgery, with the diagnosis of ectopic parathyroid gland  The various methods of treatment have been discussed with the patient and family. After consideration of risks, benefits and other options for treatment, the patient has consented to    Procedure(s): PARATHYROIDECTOMY (N/A) as a surgical intervention .    The patient's history has been reviewed, patient examined, no change in status, stable for surgery.  I have reviewed the patient's chart and labs.  Questions were answered to the patient's satisfaction.    [name], MD, [facility] Office: [place]

## 2016-10-23 NOTE — Brief Op Note (Signed)
10/23/2016  12:35 PM  PATIENT:  Carl Found Sr.  56 y.o. male  PRE-OPERATIVE DIAGNOSIS:  ectopic parathyroid gland  POST-OPERATIVE DIAGNOSIS:  ectopic parathyroid gland  PROCEDURE:  Neck exploration, thyroid isthmusectomy  SURGEON:  Surgeon(s) and Role:    * Armandina Gemma, MD - Primary    * Excell Seltzer, MD - Assisting  ANESTHESIA:   general  EBL:  Total I/O In: 350 [I.V.:350] Out: -   BLOOD ADMINISTERED:none  DRAINS: none   LOCAL MEDICATIONS USED:  MARCAINE     SPECIMEN:  Excision  DISPOSITION OF SPECIMEN:  PATHOLOGY  COUNTS:  YES  TOURNIQUET:  * No tourniquets in log *  DICTATION: .Other Dictation: Dictation Number [id number]  PLAN OF CARE: Admit for overnight observation  PATIENT DISPOSITION:  PACU - hemodynamically stable.   Delay start of Pharmacological VTE agent (>24hrs) due to surgical blood loss or risk of bleeding: yes  [name], MD, [facility] Office: [phone]

## 2016-10-24 ENCOUNTER — Encounter (HOSPITAL_COMMUNITY): Payer: Self-pay | Admitting: Surgery

## 2016-10-24 DIAGNOSIS — Z87891 Personal history of nicotine dependence: Secondary | ICD-10-CM | POA: Diagnosis not present

## 2016-10-24 DIAGNOSIS — D649 Anemia, unspecified: Secondary | ICD-10-CM | POA: Diagnosis not present

## 2016-10-24 DIAGNOSIS — Z7982 Long term (current) use of aspirin: Secondary | ICD-10-CM | POA: Diagnosis not present

## 2016-10-24 DIAGNOSIS — N2581 Secondary hyperparathyroidism of renal origin: Secondary | ICD-10-CM | POA: Diagnosis not present

## 2016-10-24 DIAGNOSIS — N186 End stage renal disease: Secondary | ICD-10-CM | POA: Diagnosis not present

## 2016-10-24 DIAGNOSIS — I12 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 stage renal disease: Secondary | ICD-10-CM | POA: Diagnosis not present

## 2016-10-24 LAB — MRSA PCR SCREENING: MRSA by PCR: NEGATIVE

## 2016-10-24 LAB — BASIC METABOLIC PANEL
Anion gap: 14 (ref 5–15)
BUN: 61 mg/dL — ABNORMAL HIGH (ref 6–20)
CO2: 23 mmol/L (ref 22–32)
Calcium: 9.3 mg/dL (ref 8.9–10.3)
Chloride: 95 mmol/L — ABNORMAL LOW (ref 101–111)
Creatinine, Ser: 11.35 mg/dL — ABNORMAL HIGH (ref 0.61–1.24)
GFR calc Af Amer: 5 mL/min — ABNORMAL LOW (ref >60)
GFR calc non Af Amer: 4 mL/min — ABNORMAL LOW (ref >60)
Glucose, Bld: 91 mg/dL (ref 65–99)
Potassium: 5 mmol/L (ref 3.5–5.1)
Sodium: 132 mmol/L — ABNORMAL LOW (ref 135–145)

## 2016-10-24 MED ORDER — DOXERCALCIFEROL 4 MCG/2ML IV SOLN
INTRAVENOUS | Status: AC
  Administered 2016-10-24: 2 ug via INTRAVENOUS
  Filled 2016-10-24: qty 2

## 2016-10-24 MED ORDER — TRAMADOL HCL 50 MG PO TABS: 50.0000 mg | ORAL_TABLET | Freq: Four times a day (QID) | ORAL | 0 refills | Status: DC | PRN

## 2016-10-24 MED ORDER — DOXERCALCIFEROL 4 MCG/2ML IV SOLN
INTRAVENOUS | Status: AC
  Filled 2016-10-24: qty 2

## 2016-10-24 NOTE — Progress Notes (Signed)
Patient discharged to home with instructions and prescription. 

## 2016-10-24 NOTE — Discharge Summary (Signed)
Physician Discharge Summary [facility]  Patient ID: Carl Found Sr. MRN: [id number] DOB/AGE: 1960/06/16 56 y.o.  Admit date: 10/23/2016 Discharge date: 10/24/2016  Admission Diagnoses:  Secondary hyperparathyroidism, renal origin  Discharge Diagnoses:  Principal Problem:   Secondary hyperparathyroidism ([place]) Active Problems:   Secondary hyperparathyroidism of renal origin [facility])   Discharged Condition: good  Hospital Course: Patient was admitted for observation following parathyroid surgery.  Post op course was uncomplicated.  Pain was well controlled.  Tolerated diet.  Hemodialysis on morning following surgery.  Patient was prepared for discharge home on POD#1 after hemodialysis.  Consults: nephrology  Treatments: surgery: neck exploration and thyroid isthmusectomy  Discharge Exam: Blood pressure 129/75, pulse 73, temperature 97.8 F (36.6 C), temperature source Oral, resp. rate 17, height 6\' 3"  (1.905 m), weight 90.3 kg (199 lb 1.2 oz), SpO2 100 %. HEENT - clear Neck - wound dry and intact; mild STS; mild hoarseness Chest - clear bilaterally Cor - RRR  Disposition: Home  Discharge Instructions    Apply dressing    Complete by:  As directed    Apply light gauze dressing to wound before discharge home today.   Discharge instructions    Complete by:  As directed    [facility]  THYROID & PARATHYROID SURGERY:  POST-OP INSTRUCTIONS  Always review your discharge instruction sheet from the facility where your surgery was performed.  A prescription for pain medication may be given to you upon discharge.  Take your pain medication as prescribed.  If narcotic pain medicine is not needed, then you may take acetaminophen (Tylenol) or ibuprofen (Advil) as needed.  Take your usually prescribed medications unless otherwise directed.  If you need a refill on your pain medication, please contact our office during regular business hours.   Prescriptions will not be processed by our office after 5 pm or on weekends.  Start with a light diet upon arrival home, such as soup and crackers or toast.  Be sure to drink plenty of fluids daily.  Resume your normal diet the day after surgery.  Most patients will experience some swelling and bruising on the chest and neck area.  Ice packs will help.  Swelling and bruising can take several days to resolve.   It is common to experience some constipation after surgery.  Increasing fluid intake and taking a stool softener (Colace) will usually help or prevent this problem.  A mild laxative (Milk of Magnesia or Miralax) should be taken according to package directions if there has been no bowel movement after 48 hours.  You have steri-strips and a gauze dressing over your incision.  You may remove the gauze bandage on the second day after surgery, and you may shower at that time.  Leave your steri-strips (small skin tapes) in place directly over the incision.  These strips should remain on the skin for 5-7 days and then be removed.  You may get them wet in the shower and pat them dry.  You may resume regular (light) daily activities beginning the next day - such as daily self-care, walking, climbing stairs - gradually increasing activities as tolerated.  You may have sexual intercourse when it is comfortable.  Refrain from any heavy lifting or straining until approved by your doctor.  You may drive when you no longer are taking prescription pain medication, you can comfortably wear a seatbelt, and you can safely maneuver your car and apply brakes.  You should see your doctor in the  office for a follow-up appointment approximately three weeks after your surgery.  Make sure that you call for this appointment within a day or two after you arrive home to insure a convenient appointment time.  WHEN TO CALL YOUR DOCTOR: -- Fever greater than 101.5 -- Inability to urinate -- Nausea and/or vomiting -  persistent -- Extreme swelling or bruising -- Continued bleeding from incision -- Increased pain, redness, or drainage from the incision -- Difficulty swallowing or breathing -- Muscle cramping or spasms -- Numbness or tingling in hands or around lips  The clinic staff is available to answer your questions during regular business hours.  Please don't hesitate to call and ask to speak to one of the nurses if you have concerns.  [name], MD, [facility] Office: [phone]  Website: [email or web address]   Increase activity slowly    Complete by:  As directed    Remove dressing in 24 hours    Complete by:  As directed      Allergies as of 10/24/2016      Reactions   Infed [iron Dextran] Other (See Comments)   Decreased BP    Oxycodone Nausea Only   Vicodin [hydrocodone-acetaminophen] Other (See Comments)   Decreased BP   Diclofenac Other (See Comments)   Unknown   Whole Blood Other (See Comments)   Patient refuses whole blood "unless I am going to die"      Medication List    TAKE these medications   aspirin EC 81 MG tablet Take 1 tablet (81 mg total) by mouth daily. What changed:  when to take this   atorvastatin 10 MG tablet Commonly known as:  LIPITOR Take 1 tablet (10 mg total) by mouth daily at 6 PM.   butalbital-acetaminophen-caffeine 50-325-40 MG tablet Commonly known as:  FIORICET, ESGIC Take 1 tablet by mouth every 12 (twelve) hours as needed for headache. No more than 2 tabs a day and no more than 5 tabs a week.   cinacalcet 90 MG tablet Commonly known as:  SENSIPAR Take 180 mg by mouth daily.   docusate sodium 100 MG capsule Commonly known as:  COLACE Take 1 capsule (100 mg total) by mouth 2 (two) times daily as needed for mild constipation.   gabapentin 100 MG capsule Commonly known as:  NEURONTIN Take 100 mg by mouth daily.   ibuprofen 800 MG tablet Commonly known as:   ADVIL,MOTRIN Take 800 mg by mouth 3 (three) times daily as needed for mild pain.   multivitamin Tabs tablet Take 1 tablet by mouth daily.   omeprazole 20 MG capsule Commonly known as:  PRILOSEC Take 1 capsule (20 mg total) by mouth daily. 30-60 mins before breakfast.   sorbitol 70 % solution Take 15-60 mLs by mouth daily as needed ((do not take on/before dialysis days)).   traMADol 50 MG tablet Commonly known as:  ULTRAM Take 1-2 tablets (50-100 mg total) by mouth every 6 (six) hours as needed for moderate pain or severe pain.            Discharge Care Instructions        Start     Ordered   10/24/16 0000  Increase activity slowly     10/24/16 0736   10/24/16 0000  Discharge instructions    Comments:  [facility]  THYROID & PARATHYROID SURGERY:  POST-OP INSTRUCTIONS  Always review your discharge instruction sheet from the facility where your surgery  was performed.  A prescription for pain medication may be given to you upon discharge.  Take your pain medication as prescribed.  If narcotic pain medicine is not needed, then you may take acetaminophen (Tylenol) or ibuprofen (Advil) as needed.  Take your usually prescribed medications unless otherwise directed.  If you need a refill on your pain medication, please contact our office during regular business hours.  Prescriptions will not be processed by our office after 5 pm or on weekends.  Start with a light diet upon arrival home, such as soup and crackers or toast.  Be sure to drink plenty of fluids daily.  Resume your normal diet the day after surgery.  Most patients will experience some swelling and bruising on the chest and neck area.  Ice packs will help.  Swelling and bruising can take several days to resolve.   It is common to experience some constipation after surgery.  Increasing fluid intake and taking a stool softener (Colace) will usually help or prevent this problem.  A mild laxative (Milk  of Magnesia or Miralax) should be taken according to package directions if there has been no bowel movement after 48 hours.  You have steri-strips and a gauze dressing over your incision.  You may remove the gauze bandage on the second day after surgery, and you may shower at that time.  Leave your steri-strips (small skin tapes) in place directly over the incision.  These strips should remain on the skin for 5-7 days and then be removed.  You may get them wet in the shower and pat them dry.  You may resume regular (light) daily activities beginning the next day - such as daily self-care, walking, climbing stairs - gradually increasing activities as tolerated.  You may have sexual intercourse when it is comfortable.  Refrain from any heavy lifting or straining until approved by your doctor.  You may drive when you no longer are taking prescription pain medication, you can comfortably wear a seatbelt, and you can safely maneuver your car and apply brakes.  You should see your doctor in the office for a follow-up appointment approximately three weeks after your surgery.  Make sure that you call for this appointment within a day or two after you arrive home to insure a convenient appointment time.  WHEN TO CALL YOUR DOCTOR: -- Fever greater than 101.5 -- Inability to urinate -- Nausea and/or vomiting - persistent -- Extreme swelling or bruising -- Continued bleeding from incision -- Increased pain, redness, or drainage from the incision -- Difficulty swallowing or breathing -- Muscle cramping or spasms -- Numbness or tingling in hands or around lips  The clinic staff is available to answer your questions during regular business hours.  Please don't hesitate to call and ask to speak to one of the nurses if you have concerns.  [name], MD, [facility] Office: [phone]  Website: [email or web address]   10/24/16 0736    10/24/16 0000  Remove dressing in 24 hours     10/24/16 0736   10/24/16 0000  traMADol (ULTRAM) 50 MG tablet  Every 6 hours PRN     10/24/16 0736   10/24/16 0000  Apply dressing    Comments:  Apply light gauze dressing to wound before discharge home today.   10/24/16 [place], MD, [facility] Office: [phone]   Signed: [name] 10/24/2016, 7:37 AM

## 2016-10-24 NOTE — Progress Notes (Signed)
Subjective: Interval History: has no complaint, some soreness in throat.  Objective: Vital signs in last 24 hours: Temp:  [97.4 F (36.3 C)-97.8 F (36.6 C)] 97.8 F (36.6 C) (09/12 3716) Pulse Rate:  [68-79] 73 (09/12 0613) Resp:  [8-18] 17 (09/12 0613) BP: (111-142)/(75-90) 129/75 (09/12 0613) SpO2:  [100 %] 100 % (09/12 9678) Weight:  [90.3 kg (199 lb 1.2 oz)] 90.3 kg (199 lb 1.2 oz) (09/11 1710) Weight change:   Intake/Output from previous day: 09/11 0701 - 09/12 0700 In: 570 [P.O.:220; I.V.:350] Out: 30 [Blood:30] Intake/Output this shift: No intake/output data recorded.  General appearance: alert, cooperative, no distress and mildly obese Neck: neck incision with some swelling Resp: clear to auscultation bilaterally Cardio: S1, S2 normal and systolic murmur: systolic ejection 2/6, crescendo and decrescendo at 2nd left intercostal space GI: soft, non-tender; bowel sounds normal; no masses,  no organomegaly ExtremitieaVF R FAaVF R FA  Lab Results:  Recent Labs  10/23/16 0729  WBC 6.0  HGB 11.7*  HCT 37.2*  PLT 252   BMET:  Recent Labs  10/23/16 0729 10/24/16 0730  NA 135 132*  K 5.0 5.0  CL 96* 95*  CO2 26 23  GLUCOSE 83 91  BUN 42* 61*  CREATININE 8.91* 11.35*  CALCIUM 9.4 9.3   No results for input(s): PTH in the last 72 hours. Iron Studies: No results for input(s): IRON, TIBC, TRANSFERRIN, FERRITIN in the last 72 hours.  Studies/Results: No results found.  I have reviewed the patient's current medications.  Assessment/Plan: 1 ESRD for HD 2 Anemia stable 3 HPTH ??if any improvement, Ca no change 4 HTN controlled P HD, D/C Ca f/u    LOS: 0 days   Telma Pyeatt L 10/24/2016,9:05 AM

## 2016-10-24 NOTE — Op Note (Signed)
NAME:  [name], [name] NO.:  MEDICAL RECORD NO.:  [id number]  LOCATION:                                 FACILITY:  PHYSICIAN:  [name], MD      DATE OF BIRTH:  07-27-1960  DATE OF PROCEDURE:  10/23/2016                              OPERATIVE REPORT   PREOPERATIVE DIAGNOSIS:  Secondary hyperparathyroidism.  POSTOPERATIVE DIAGNOSIS:  Secondary hyperparathyroidism.  PROCEDURE:  Neck exploration, thyroid isthmusectomy.  SURGEON:  [name], MD.  ASSISTANTDarene Lamer. Hoxworth, M.D.  ANESTHESIA:  General.  ESTIMATED BLOOD LOSS:  50 mL.  PREPARATION:  ChloraPrep.  COMPLICATIONS:  None.  INDICATIONS:  The patient is a 56 year old male with secondary hyperparathyroidism due to end-stage renal disease.  The patient had undergone previous neck exploration and parathyroidectomy with removal of three glands.  He had autotransplantation to the left forearm.  The patient had persistently elevated intact PTH level.  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  The patient is now brought to the operating room for exploration and resection of ectopic parathyroid tissue.  BODY OF REPORT:  Procedure was done in OR #15 at the [place].  The patient was brought to the operating room, and placed in a supine position on the operating room table.  Following administration of general anesthesia, the patient was positioned and then prepped and draped in the usual aseptic fashion.  After ascertaining that an adequate level of anesthesia had been achieved, the previous Kocher incision was reopened with the #15 blade.  Dissection was carried through subcutaneous tissues and hemostasis achieved with the electrocautery.  Dissection was carried through the platysma and skin flaps were elevated cephalad and caudad.  Retractors were  placed for exposure.  Strap muscles were incised in the midline.  Thyroid isthmus was identified.  On palpation, it appeared to contain a small nodule.  Further dissection allowed for mobilization of tissues exposing the trachea in the midline.  Dissection was carried into the anterior mediastinum beneath the manubrium.  Dissection was carried down to the subclavian artery, which was mobilized anteriorly and posteriorly. There are no apparent masses consistent with parathyroid tissue. Dissection is carried down to the trachea and behind the subclavian artery.  There is some adipose tissue on the anterior surface of the trachea.  This was mobilized and excised using the Harmonic Scalpel and small Ligaclips for hemostasis.  This tissue was submitted to Pathology for permanent evaluation labeled as anterior tracheal tissue.  Further dissection in the anterior mediastinum reveals no evidence of parathyroid tissue.  The thyroid gland was mobilized and the isthmus completely exposed.  It does contain a nodule.  Decision was made to proceed with thyroid isthmusectomy.  This was performed by mobilizing the thyroid isthmus and dividing the thyroid parenchyma at the junction of the isthmus and left thyroid lobes bilaterally using the Harmonic scalpel.  The isthmus was excised and sectioned on the table.  It does contain a small approximately 1 cm nodule, which appears to be thyroid tissue.  It  is submitted in its entirety to Pathology for review.  Further dissection reveals no evidence of abnormal parathyroid tissue. The decision was made to complete the procedure.  Hemostasis was achieved throughout the surgical field.  Fibrillar was placed throughout the surgical field.  Strap muscles were reapproximated in the midline with interrupted 3-0 Vicryl sutures.  Platysma was closed with interrupted 3-0 Vicryl sutures.  Skin was closed following installation of local anesthetic and the skin edges  with a running 4-0 Monocryl subcuticular suture.  Wound was washed and dried, and Steri-Strips were applied.  Sterile dressings were applied.  The patient was awakened from anesthesia and brought to the recovery room.  The patient tolerated the procedure well.   [name], MD, [facility] Office: [phone]     TMG/MEDQ  D:  10/23/2016  T:  10/24/2016  Job:  [id number]  cc:   Elzie Rings. Lorrene Reid, M.D.

## 2016-10-24 NOTE — Procedures (Signed)
I was present at this session.  I have reviewed the session itself and made appropriate changes.  HD via RLA avf, access press ok. bp tol vol off, no hep post op  Mesiah Manzo L 9/12/20189:08 AM

## 2016-10-26 DIAGNOSIS — D631 Anemia in chronic kidney disease: Secondary | ICD-10-CM | POA: Diagnosis not present

## 2016-10-26 DIAGNOSIS — E1129 Type 2 diabetes mellitus with other diabetic kidney complication: Secondary | ICD-10-CM | POA: Diagnosis not present

## 2016-10-26 DIAGNOSIS — N2581 Secondary hyperparathyroidism of renal origin: Secondary | ICD-10-CM | POA: Diagnosis not present

## 2016-10-26 DIAGNOSIS — E875 Hyperkalemia: Secondary | ICD-10-CM | POA: Diagnosis not present

## 2016-10-26 DIAGNOSIS — Z23 Encounter for immunization: Secondary | ICD-10-CM | POA: Diagnosis not present

## 2016-10-26 DIAGNOSIS — N186 End stage renal disease: Secondary | ICD-10-CM | POA: Diagnosis not present

## 2016-10-29 DIAGNOSIS — E1129 Type 2 diabetes mellitus with other diabetic kidney complication: Secondary | ICD-10-CM | POA: Diagnosis not present

## 2016-10-29 DIAGNOSIS — N2581 Secondary hyperparathyroidism of renal origin: Secondary | ICD-10-CM | POA: Diagnosis not present

## 2016-10-29 DIAGNOSIS — N186 End stage renal disease: Secondary | ICD-10-CM | POA: Diagnosis not present

## 2016-10-29 DIAGNOSIS — D631 Anemia in chronic kidney disease: Secondary | ICD-10-CM | POA: Diagnosis not present

## 2016-10-29 DIAGNOSIS — Z23 Encounter for immunization: Secondary | ICD-10-CM | POA: Diagnosis not present

## 2016-10-29 DIAGNOSIS — E875 Hyperkalemia: Secondary | ICD-10-CM | POA: Diagnosis not present

## 2016-10-31 DIAGNOSIS — N2581 Secondary hyperparathyroidism of renal origin: Secondary | ICD-10-CM | POA: Diagnosis not present

## 2016-10-31 DIAGNOSIS — Z23 Encounter for immunization: Secondary | ICD-10-CM | POA: Diagnosis not present

## 2016-10-31 DIAGNOSIS — E875 Hyperkalemia: Secondary | ICD-10-CM | POA: Diagnosis not present

## 2016-10-31 DIAGNOSIS — E1129 Type 2 diabetes mellitus with other diabetic kidney complication: Secondary | ICD-10-CM | POA: Diagnosis not present

## 2016-10-31 DIAGNOSIS — N186 End stage renal disease: Secondary | ICD-10-CM | POA: Diagnosis not present

## 2016-10-31 DIAGNOSIS — D631 Anemia in chronic kidney disease: Secondary | ICD-10-CM | POA: Diagnosis not present

## 2016-11-02 DIAGNOSIS — Z23 Encounter for immunization: Secondary | ICD-10-CM | POA: Diagnosis not present

## 2016-11-02 DIAGNOSIS — E875 Hyperkalemia: Secondary | ICD-10-CM | POA: Diagnosis not present

## 2016-11-02 DIAGNOSIS — E1129 Type 2 diabetes mellitus with other diabetic kidney complication: Secondary | ICD-10-CM | POA: Diagnosis not present

## 2016-11-02 DIAGNOSIS — N2581 Secondary hyperparathyroidism of renal origin: Secondary | ICD-10-CM | POA: Diagnosis not present

## 2016-11-02 DIAGNOSIS — D631 Anemia in chronic kidney disease: Secondary | ICD-10-CM | POA: Diagnosis not present

## 2016-11-02 DIAGNOSIS — N186 End stage renal disease: Secondary | ICD-10-CM | POA: Diagnosis not present

## 2016-11-05 DIAGNOSIS — D631 Anemia in chronic kidney disease: Secondary | ICD-10-CM | POA: Diagnosis not present

## 2016-11-05 DIAGNOSIS — E1129 Type 2 diabetes mellitus with other diabetic kidney complication: Secondary | ICD-10-CM | POA: Diagnosis not present

## 2016-11-05 DIAGNOSIS — Z23 Encounter for immunization: Secondary | ICD-10-CM | POA: Diagnosis not present

## 2016-11-05 DIAGNOSIS — E875 Hyperkalemia: Secondary | ICD-10-CM | POA: Diagnosis not present

## 2016-11-05 DIAGNOSIS — N2581 Secondary hyperparathyroidism of renal origin: Secondary | ICD-10-CM | POA: Diagnosis not present

## 2016-11-05 DIAGNOSIS — N186 End stage renal disease: Secondary | ICD-10-CM | POA: Diagnosis not present

## 2016-11-07 DIAGNOSIS — N186 End stage renal disease: Secondary | ICD-10-CM | POA: Diagnosis not present

## 2016-11-07 DIAGNOSIS — E875 Hyperkalemia: Secondary | ICD-10-CM | POA: Diagnosis not present

## 2016-11-07 DIAGNOSIS — N2581 Secondary hyperparathyroidism of renal origin: Secondary | ICD-10-CM | POA: Diagnosis not present

## 2016-11-07 DIAGNOSIS — D631 Anemia in chronic kidney disease: Secondary | ICD-10-CM | POA: Diagnosis not present

## 2016-11-07 DIAGNOSIS — Z23 Encounter for immunization: Secondary | ICD-10-CM | POA: Diagnosis not present

## 2016-11-07 DIAGNOSIS — E1129 Type 2 diabetes mellitus with other diabetic kidney complication: Secondary | ICD-10-CM | POA: Diagnosis not present

## 2016-11-09 DIAGNOSIS — N2581 Secondary hyperparathyroidism of renal origin: Secondary | ICD-10-CM | POA: Diagnosis not present

## 2016-11-09 DIAGNOSIS — N186 End stage renal disease: Secondary | ICD-10-CM | POA: Diagnosis not present

## 2016-11-09 DIAGNOSIS — E1129 Type 2 diabetes mellitus with other diabetic kidney complication: Secondary | ICD-10-CM | POA: Diagnosis not present

## 2016-11-09 DIAGNOSIS — Z23 Encounter for immunization: Secondary | ICD-10-CM | POA: Diagnosis not present

## 2016-11-09 DIAGNOSIS — D631 Anemia in chronic kidney disease: Secondary | ICD-10-CM | POA: Diagnosis not present

## 2016-11-09 DIAGNOSIS — E875 Hyperkalemia: Secondary | ICD-10-CM | POA: Diagnosis not present

## 2016-11-11 DIAGNOSIS — N186 End stage renal disease: Secondary | ICD-10-CM | POA: Diagnosis not present

## 2016-11-11 DIAGNOSIS — Z992 Dependence on renal dialysis: Secondary | ICD-10-CM | POA: Diagnosis not present

## 2016-11-11 DIAGNOSIS — I12 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 stage renal disease: Secondary | ICD-10-CM | POA: Diagnosis not present

## 2016-11-12 DIAGNOSIS — E875 Hyperkalemia: Secondary | ICD-10-CM | POA: Diagnosis not present

## 2016-11-12 DIAGNOSIS — E1129 Type 2 diabetes mellitus with other diabetic kidney complication: Secondary | ICD-10-CM | POA: Diagnosis not present

## 2016-11-12 DIAGNOSIS — D631 Anemia in chronic kidney disease: Secondary | ICD-10-CM | POA: Diagnosis not present

## 2016-11-12 DIAGNOSIS — N2581 Secondary hyperparathyroidism of renal origin: Secondary | ICD-10-CM | POA: Diagnosis not present

## 2016-11-12 DIAGNOSIS — N186 End stage renal disease: Secondary | ICD-10-CM | POA: Diagnosis not present

## 2016-11-14 DIAGNOSIS — N2581 Secondary hyperparathyroidism of renal origin: Secondary | ICD-10-CM | POA: Diagnosis not present

## 2016-11-14 DIAGNOSIS — E875 Hyperkalemia: Secondary | ICD-10-CM | POA: Diagnosis not present

## 2016-11-14 DIAGNOSIS — E1129 Type 2 diabetes mellitus with other diabetic kidney complication: Secondary | ICD-10-CM | POA: Diagnosis not present

## 2016-11-14 DIAGNOSIS — N186 End stage renal disease: Secondary | ICD-10-CM | POA: Diagnosis not present

## 2016-11-14 DIAGNOSIS — D631 Anemia in chronic kidney disease: Secondary | ICD-10-CM | POA: Diagnosis not present

## 2016-11-16 DIAGNOSIS — N2581 Secondary hyperparathyroidism of renal origin: Secondary | ICD-10-CM | POA: Diagnosis not present

## 2016-11-16 DIAGNOSIS — D631 Anemia in chronic kidney disease: Secondary | ICD-10-CM | POA: Diagnosis not present

## 2016-11-16 DIAGNOSIS — E1129 Type 2 diabetes mellitus with other diabetic kidney complication: Secondary | ICD-10-CM | POA: Diagnosis not present

## 2016-11-16 DIAGNOSIS — N186 End stage renal disease: Secondary | ICD-10-CM | POA: Diagnosis not present

## 2016-11-16 DIAGNOSIS — E875 Hyperkalemia: Secondary | ICD-10-CM | POA: Diagnosis not present

## 2016-11-19 DIAGNOSIS — E875 Hyperkalemia: Secondary | ICD-10-CM | POA: Diagnosis not present

## 2016-11-19 DIAGNOSIS — E1129 Type 2 diabetes mellitus with other diabetic kidney complication: Secondary | ICD-10-CM | POA: Diagnosis not present

## 2016-11-19 DIAGNOSIS — N186 End stage renal disease: Secondary | ICD-10-CM | POA: Diagnosis not present

## 2016-11-19 DIAGNOSIS — D631 Anemia in chronic kidney disease: Secondary | ICD-10-CM | POA: Diagnosis not present

## 2016-11-19 DIAGNOSIS — N2581 Secondary hyperparathyroidism of renal origin: Secondary | ICD-10-CM | POA: Diagnosis not present

## 2016-11-21 DIAGNOSIS — E875 Hyperkalemia: Secondary | ICD-10-CM | POA: Diagnosis not present

## 2016-11-21 DIAGNOSIS — N2581 Secondary hyperparathyroidism of renal origin: Secondary | ICD-10-CM | POA: Diagnosis not present

## 2016-11-21 DIAGNOSIS — D631 Anemia in chronic kidney disease: Secondary | ICD-10-CM | POA: Diagnosis not present

## 2016-11-21 DIAGNOSIS — E1129 Type 2 diabetes mellitus with other diabetic kidney complication: Secondary | ICD-10-CM | POA: Diagnosis not present

## 2016-11-21 DIAGNOSIS — N186 End stage renal disease: Secondary | ICD-10-CM | POA: Diagnosis not present

## 2016-11-22 DIAGNOSIS — N400 Enlarged prostate without lower urinary tract symptoms: Secondary | ICD-10-CM | POA: Diagnosis not present

## 2016-11-22 DIAGNOSIS — N2889 Other specified disorders of kidney and ureter: Secondary | ICD-10-CM | POA: Insufficient documentation

## 2016-11-23 DIAGNOSIS — N186 End stage renal disease: Secondary | ICD-10-CM | POA: Diagnosis not present

## 2016-11-23 DIAGNOSIS — N2581 Secondary hyperparathyroidism of renal origin: Secondary | ICD-10-CM | POA: Diagnosis not present

## 2016-11-23 DIAGNOSIS — E875 Hyperkalemia: Secondary | ICD-10-CM | POA: Diagnosis not present

## 2016-11-23 DIAGNOSIS — E1129 Type 2 diabetes mellitus with other diabetic kidney complication: Secondary | ICD-10-CM | POA: Diagnosis not present

## 2016-11-23 DIAGNOSIS — D631 Anemia in chronic kidney disease: Secondary | ICD-10-CM | POA: Diagnosis not present

## 2016-11-26 DIAGNOSIS — N2581 Secondary hyperparathyroidism of renal origin: Secondary | ICD-10-CM | POA: Diagnosis not present

## 2016-11-26 DIAGNOSIS — E875 Hyperkalemia: Secondary | ICD-10-CM | POA: Diagnosis not present

## 2016-11-26 DIAGNOSIS — N186 End stage renal disease: Secondary | ICD-10-CM | POA: Diagnosis not present

## 2016-11-26 DIAGNOSIS — E1129 Type 2 diabetes mellitus with other diabetic kidney complication: Secondary | ICD-10-CM | POA: Diagnosis not present

## 2016-11-26 DIAGNOSIS — D631 Anemia in chronic kidney disease: Secondary | ICD-10-CM | POA: Diagnosis not present

## 2016-11-28 DIAGNOSIS — N186 End stage renal disease: Secondary | ICD-10-CM | POA: Diagnosis not present

## 2016-11-28 DIAGNOSIS — N2581 Secondary hyperparathyroidism of renal origin: Secondary | ICD-10-CM | POA: Diagnosis not present

## 2016-11-28 DIAGNOSIS — E875 Hyperkalemia: Secondary | ICD-10-CM | POA: Diagnosis not present

## 2016-11-28 DIAGNOSIS — D631 Anemia in chronic kidney disease: Secondary | ICD-10-CM | POA: Diagnosis not present

## 2016-11-28 DIAGNOSIS — E1129 Type 2 diabetes mellitus with other diabetic kidney complication: Secondary | ICD-10-CM | POA: Diagnosis not present

## 2016-11-29 DIAGNOSIS — H47323 Drusen of optic disc, bilateral: Secondary | ICD-10-CM | POA: Diagnosis not present

## 2016-11-29 DIAGNOSIS — H17822 Peripheral opacity of cornea, left eye: Secondary | ICD-10-CM | POA: Diagnosis not present

## 2016-11-29 DIAGNOSIS — H35033 Hypertensive retinopathy, bilateral: Secondary | ICD-10-CM | POA: Diagnosis not present

## 2016-11-29 DIAGNOSIS — Z961 Presence of intraocular lens: Secondary | ICD-10-CM | POA: Diagnosis not present

## 2016-11-30 DIAGNOSIS — D631 Anemia in chronic kidney disease: Secondary | ICD-10-CM | POA: Diagnosis not present

## 2016-11-30 DIAGNOSIS — E1129 Type 2 diabetes mellitus with other diabetic kidney complication: Secondary | ICD-10-CM | POA: Diagnosis not present

## 2016-11-30 DIAGNOSIS — N2581 Secondary hyperparathyroidism of renal origin: Secondary | ICD-10-CM | POA: Diagnosis not present

## 2016-11-30 DIAGNOSIS — N186 End stage renal disease: Secondary | ICD-10-CM | POA: Diagnosis not present

## 2016-11-30 DIAGNOSIS — E875 Hyperkalemia: Secondary | ICD-10-CM | POA: Diagnosis not present

## 2016-12-03 DIAGNOSIS — E875 Hyperkalemia: Secondary | ICD-10-CM | POA: Diagnosis not present

## 2016-12-03 DIAGNOSIS — N186 End stage renal disease: Secondary | ICD-10-CM | POA: Diagnosis not present

## 2016-12-03 DIAGNOSIS — N2581 Secondary hyperparathyroidism of renal origin: Secondary | ICD-10-CM | POA: Diagnosis not present

## 2016-12-03 DIAGNOSIS — E1129 Type 2 diabetes mellitus with other diabetic kidney complication: Secondary | ICD-10-CM | POA: Diagnosis not present

## 2016-12-03 DIAGNOSIS — D631 Anemia in chronic kidney disease: Secondary | ICD-10-CM | POA: Diagnosis not present

## 2016-12-05 DIAGNOSIS — D631 Anemia in chronic kidney disease: Secondary | ICD-10-CM | POA: Diagnosis not present

## 2016-12-05 DIAGNOSIS — N2581 Secondary hyperparathyroidism of renal origin: Secondary | ICD-10-CM | POA: Diagnosis not present

## 2016-12-05 DIAGNOSIS — E1129 Type 2 diabetes mellitus with other diabetic kidney complication: Secondary | ICD-10-CM | POA: Diagnosis not present

## 2016-12-05 DIAGNOSIS — N186 End stage renal disease: Secondary | ICD-10-CM | POA: Diagnosis not present

## 2016-12-05 DIAGNOSIS — E875 Hyperkalemia: Secondary | ICD-10-CM | POA: Diagnosis not present

## 2016-12-07 DIAGNOSIS — N186 End stage renal disease: Secondary | ICD-10-CM | POA: Diagnosis not present

## 2016-12-07 DIAGNOSIS — E1129 Type 2 diabetes mellitus with other diabetic kidney complication: Secondary | ICD-10-CM | POA: Diagnosis not present

## 2016-12-07 DIAGNOSIS — N2581 Secondary hyperparathyroidism of renal origin: Secondary | ICD-10-CM | POA: Diagnosis not present

## 2016-12-07 DIAGNOSIS — E875 Hyperkalemia: Secondary | ICD-10-CM | POA: Diagnosis not present

## 2016-12-07 DIAGNOSIS — D631 Anemia in chronic kidney disease: Secondary | ICD-10-CM | POA: Diagnosis not present

## 2016-12-10 DIAGNOSIS — N186 End stage renal disease: Secondary | ICD-10-CM | POA: Diagnosis not present

## 2016-12-10 DIAGNOSIS — E1129 Type 2 diabetes mellitus with other diabetic kidney complication: Secondary | ICD-10-CM | POA: Diagnosis not present

## 2016-12-10 DIAGNOSIS — D631 Anemia in chronic kidney disease: Secondary | ICD-10-CM | POA: Diagnosis not present

## 2016-12-10 DIAGNOSIS — E875 Hyperkalemia: Secondary | ICD-10-CM | POA: Diagnosis not present

## 2016-12-10 DIAGNOSIS — N2581 Secondary hyperparathyroidism of renal origin: Secondary | ICD-10-CM | POA: Diagnosis not present

## 2016-12-12 DIAGNOSIS — N186 End stage renal disease: Secondary | ICD-10-CM | POA: Diagnosis not present

## 2016-12-12 DIAGNOSIS — Z992 Dependence on renal dialysis: Secondary | ICD-10-CM | POA: Diagnosis not present

## 2016-12-12 DIAGNOSIS — I12 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 stage renal disease: Secondary | ICD-10-CM | POA: Diagnosis not present

## 2016-12-12 DIAGNOSIS — E1129 Type 2 diabetes mellitus with other diabetic kidney complication: Secondary | ICD-10-CM | POA: Diagnosis not present

## 2016-12-12 DIAGNOSIS — E875 Hyperkalemia: Secondary | ICD-10-CM | POA: Diagnosis not present

## 2016-12-12 DIAGNOSIS — N2581 Secondary hyperparathyroidism of renal origin: Secondary | ICD-10-CM | POA: Diagnosis not present

## 2016-12-12 DIAGNOSIS — D631 Anemia in chronic kidney disease: Secondary | ICD-10-CM | POA: Diagnosis not present

## 2016-12-14 DIAGNOSIS — E875 Hyperkalemia: Secondary | ICD-10-CM | POA: Diagnosis not present

## 2016-12-14 DIAGNOSIS — N2581 Secondary hyperparathyroidism of renal origin: Secondary | ICD-10-CM | POA: Diagnosis not present

## 2016-12-14 DIAGNOSIS — E1129 Type 2 diabetes mellitus with other diabetic kidney complication: Secondary | ICD-10-CM | POA: Diagnosis not present

## 2016-12-14 DIAGNOSIS — D631 Anemia in chronic kidney disease: Secondary | ICD-10-CM | POA: Diagnosis not present

## 2016-12-14 DIAGNOSIS — N186 End stage renal disease: Secondary | ICD-10-CM | POA: Diagnosis not present

## 2016-12-17 DIAGNOSIS — D631 Anemia in chronic kidney disease: Secondary | ICD-10-CM | POA: Diagnosis not present

## 2016-12-17 DIAGNOSIS — E875 Hyperkalemia: Secondary | ICD-10-CM | POA: Diagnosis not present

## 2016-12-17 DIAGNOSIS — E1129 Type 2 diabetes mellitus with other diabetic kidney complication: Secondary | ICD-10-CM | POA: Diagnosis not present

## 2016-12-17 DIAGNOSIS — N186 End stage renal disease: Secondary | ICD-10-CM | POA: Diagnosis not present

## 2016-12-17 DIAGNOSIS — N2581 Secondary hyperparathyroidism of renal origin: Secondary | ICD-10-CM | POA: Diagnosis not present

## 2016-12-19 DIAGNOSIS — E875 Hyperkalemia: Secondary | ICD-10-CM | POA: Diagnosis not present

## 2016-12-19 DIAGNOSIS — D631 Anemia in chronic kidney disease: Secondary | ICD-10-CM | POA: Diagnosis not present

## 2016-12-19 DIAGNOSIS — N2581 Secondary hyperparathyroidism of renal origin: Secondary | ICD-10-CM | POA: Diagnosis not present

## 2016-12-19 DIAGNOSIS — N186 End stage renal disease: Secondary | ICD-10-CM | POA: Diagnosis not present

## 2016-12-19 DIAGNOSIS — E1129 Type 2 diabetes mellitus with other diabetic kidney complication: Secondary | ICD-10-CM | POA: Diagnosis not present

## 2016-12-20 ENCOUNTER — Other Ambulatory Visit (HOSPITAL_COMMUNITY): Payer: Self-pay | Admitting: Surgery

## 2016-12-20 DIAGNOSIS — N2581 Secondary hyperparathyroidism of renal origin: Secondary | ICD-10-CM

## 2016-12-21 DIAGNOSIS — N2581 Secondary hyperparathyroidism of renal origin: Secondary | ICD-10-CM | POA: Diagnosis not present

## 2016-12-21 DIAGNOSIS — D631 Anemia in chronic kidney disease: Secondary | ICD-10-CM | POA: Diagnosis not present

## 2016-12-21 DIAGNOSIS — E1129 Type 2 diabetes mellitus with other diabetic kidney complication: Secondary | ICD-10-CM | POA: Diagnosis not present

## 2016-12-21 DIAGNOSIS — N186 End stage renal disease: Secondary | ICD-10-CM | POA: Diagnosis not present

## 2016-12-21 DIAGNOSIS — E875 Hyperkalemia: Secondary | ICD-10-CM | POA: Diagnosis not present

## 2016-12-24 DIAGNOSIS — D631 Anemia in chronic kidney disease: Secondary | ICD-10-CM | POA: Diagnosis not present

## 2016-12-24 DIAGNOSIS — N2581 Secondary hyperparathyroidism of renal origin: Secondary | ICD-10-CM | POA: Diagnosis not present

## 2016-12-24 DIAGNOSIS — N186 End stage renal disease: Secondary | ICD-10-CM | POA: Diagnosis not present

## 2016-12-24 DIAGNOSIS — E1129 Type 2 diabetes mellitus with other diabetic kidney complication: Secondary | ICD-10-CM | POA: Diagnosis not present

## 2016-12-24 DIAGNOSIS — E875 Hyperkalemia: Secondary | ICD-10-CM | POA: Diagnosis not present

## 2016-12-26 DIAGNOSIS — N2581 Secondary hyperparathyroidism of renal origin: Secondary | ICD-10-CM | POA: Diagnosis not present

## 2016-12-26 DIAGNOSIS — D631 Anemia in chronic kidney disease: Secondary | ICD-10-CM | POA: Diagnosis not present

## 2016-12-26 DIAGNOSIS — E875 Hyperkalemia: Secondary | ICD-10-CM | POA: Diagnosis not present

## 2016-12-26 DIAGNOSIS — E1129 Type 2 diabetes mellitus with other diabetic kidney complication: Secondary | ICD-10-CM | POA: Diagnosis not present

## 2016-12-26 DIAGNOSIS — N186 End stage renal disease: Secondary | ICD-10-CM | POA: Diagnosis not present

## 2016-12-28 DIAGNOSIS — E875 Hyperkalemia: Secondary | ICD-10-CM | POA: Diagnosis not present

## 2016-12-28 DIAGNOSIS — N186 End stage renal disease: Secondary | ICD-10-CM | POA: Diagnosis not present

## 2016-12-28 DIAGNOSIS — N2581 Secondary hyperparathyroidism of renal origin: Secondary | ICD-10-CM | POA: Diagnosis not present

## 2016-12-28 DIAGNOSIS — E1129 Type 2 diabetes mellitus with other diabetic kidney complication: Secondary | ICD-10-CM | POA: Diagnosis not present

## 2016-12-28 DIAGNOSIS — D631 Anemia in chronic kidney disease: Secondary | ICD-10-CM | POA: Diagnosis not present

## 2016-12-30 DIAGNOSIS — D631 Anemia in chronic kidney disease: Secondary | ICD-10-CM | POA: Diagnosis not present

## 2016-12-30 DIAGNOSIS — N2581 Secondary hyperparathyroidism of renal origin: Secondary | ICD-10-CM | POA: Diagnosis not present

## 2016-12-30 DIAGNOSIS — E875 Hyperkalemia: Secondary | ICD-10-CM | POA: Diagnosis not present

## 2016-12-30 DIAGNOSIS — N186 End stage renal disease: Secondary | ICD-10-CM | POA: Diagnosis not present

## 2016-12-30 DIAGNOSIS — E1129 Type 2 diabetes mellitus with other diabetic kidney complication: Secondary | ICD-10-CM | POA: Diagnosis not present

## 2017-01-04 DIAGNOSIS — E875 Hyperkalemia: Secondary | ICD-10-CM | POA: Diagnosis not present

## 2017-01-04 DIAGNOSIS — D631 Anemia in chronic kidney disease: Secondary | ICD-10-CM | POA: Diagnosis not present

## 2017-01-04 DIAGNOSIS — N2581 Secondary hyperparathyroidism of renal origin: Secondary | ICD-10-CM | POA: Diagnosis not present

## 2017-01-04 DIAGNOSIS — N186 End stage renal disease: Secondary | ICD-10-CM | POA: Diagnosis not present

## 2017-01-04 DIAGNOSIS — E1129 Type 2 diabetes mellitus with other diabetic kidney complication: Secondary | ICD-10-CM | POA: Diagnosis not present

## 2017-01-05 DIAGNOSIS — N2581 Secondary hyperparathyroidism of renal origin: Secondary | ICD-10-CM | POA: Diagnosis not present

## 2017-01-05 DIAGNOSIS — E1129 Type 2 diabetes mellitus with other diabetic kidney complication: Secondary | ICD-10-CM | POA: Diagnosis not present

## 2017-01-05 DIAGNOSIS — N186 End stage renal disease: Secondary | ICD-10-CM | POA: Diagnosis not present

## 2017-01-05 DIAGNOSIS — E875 Hyperkalemia: Secondary | ICD-10-CM | POA: Diagnosis not present

## 2017-01-05 DIAGNOSIS — D631 Anemia in chronic kidney disease: Secondary | ICD-10-CM | POA: Diagnosis not present

## 2017-01-07 DIAGNOSIS — N186 End stage renal disease: Secondary | ICD-10-CM | POA: Diagnosis not present

## 2017-01-07 DIAGNOSIS — D631 Anemia in chronic kidney disease: Secondary | ICD-10-CM | POA: Diagnosis not present

## 2017-01-07 DIAGNOSIS — N2581 Secondary hyperparathyroidism of renal origin: Secondary | ICD-10-CM | POA: Diagnosis not present

## 2017-01-07 DIAGNOSIS — E1129 Type 2 diabetes mellitus with other diabetic kidney complication: Secondary | ICD-10-CM | POA: Diagnosis not present

## 2017-01-07 DIAGNOSIS — E875 Hyperkalemia: Secondary | ICD-10-CM | POA: Diagnosis not present

## 2017-01-08 ENCOUNTER — Encounter (HOSPITAL_COMMUNITY): Payer: Medicare Other | Attending: Surgery

## 2017-01-08 ENCOUNTER — Encounter (HOSPITAL_COMMUNITY): Payer: Medicare Other

## 2017-01-09 DIAGNOSIS — N186 End stage renal disease: Secondary | ICD-10-CM | POA: Diagnosis not present

## 2017-01-09 DIAGNOSIS — D631 Anemia in chronic kidney disease: Secondary | ICD-10-CM | POA: Diagnosis not present

## 2017-01-09 DIAGNOSIS — N2581 Secondary hyperparathyroidism of renal origin: Secondary | ICD-10-CM | POA: Diagnosis not present

## 2017-01-09 DIAGNOSIS — E875 Hyperkalemia: Secondary | ICD-10-CM | POA: Diagnosis not present

## 2017-01-09 DIAGNOSIS — E1129 Type 2 diabetes mellitus with other diabetic kidney complication: Secondary | ICD-10-CM | POA: Diagnosis not present

## 2017-01-10 ENCOUNTER — Emergency Department (HOSPITAL_COMMUNITY): Payer: Medicare Other

## 2017-01-10 ENCOUNTER — Emergency Department (HOSPITAL_COMMUNITY)
Admission: EM | Admit: 2017-01-10 | Discharge: 2017-01-10 | Discharge status: Against Medical Advice | Payer: Medicare Other | Attending: Emergency Medicine | Admitting: Emergency Medicine

## 2017-01-10 ENCOUNTER — Encounter (HOSPITAL_COMMUNITY): Payer: Self-pay

## 2017-01-10 ENCOUNTER — Encounter (HOSPITAL_COMMUNITY): Payer: Self-pay | Admitting: *Deleted

## 2017-01-10 ENCOUNTER — Ambulatory Visit (HOSPITAL_COMMUNITY)
Admission: EM | Admit: 2017-01-10 | Discharge: 2017-01-10 | Disposition: A | Discharge status: Home / Self Care | Payer: Medicare Other | Source: Home / Self Care

## 2017-01-10 DIAGNOSIS — Z5329 Procedure and treatment not carried out because of patient's decision for other reasons: Secondary | ICD-10-CM | POA: Diagnosis not present

## 2017-01-10 DIAGNOSIS — K5720 Diverticulitis of large intestine with perforation and abscess without bleeding: Secondary | ICD-10-CM | POA: Insufficient documentation

## 2017-01-10 DIAGNOSIS — Z8719 Personal history of other diseases of the digestive system: Secondary | ICD-10-CM | POA: Diagnosis not present

## 2017-01-10 DIAGNOSIS — Z7982 Long term (current) use of aspirin: Secondary | ICD-10-CM | POA: Insufficient documentation

## 2017-01-10 DIAGNOSIS — N186 End stage renal disease: Secondary | ICD-10-CM | POA: Diagnosis not present

## 2017-01-10 DIAGNOSIS — R1030 Lower abdominal pain, unspecified: Secondary | ICD-10-CM

## 2017-01-10 DIAGNOSIS — Z87891 Personal history of nicotine dependence: Secondary | ICD-10-CM | POA: Diagnosis not present

## 2017-01-10 DIAGNOSIS — I12 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 stage renal disease: Secondary | ICD-10-CM | POA: Diagnosis not present

## 2017-01-10 DIAGNOSIS — R197 Diarrhea, unspecified: Secondary | ICD-10-CM | POA: Diagnosis not present

## 2017-01-10 DIAGNOSIS — Z79899 Other long term (current) drug therapy: Secondary | ICD-10-CM | POA: Insufficient documentation

## 2017-01-10 DIAGNOSIS — Z992 Dependence on renal dialysis: Secondary | ICD-10-CM | POA: Diagnosis not present

## 2017-01-10 DIAGNOSIS — R1031 Right lower quadrant pain: Secondary | ICD-10-CM | POA: Diagnosis present

## 2017-01-10 LAB — COMPREHENSIVE METABOLIC PANEL
ALT: 13 U/L — ABNORMAL LOW (ref 17–63)
AST: 19 U/L (ref 15–41)
Albumin: 3.1 g/dL — ABNORMAL LOW (ref 3.5–5.0)
Alkaline Phosphatase: 64 U/L (ref 38–126)
Anion gap: 15 (ref 5–15)
BUN: 25 mg/dL — ABNORMAL HIGH (ref 6–20)
CO2: 27 mmol/L (ref 22–32)
Calcium: 9.5 mg/dL (ref 8.9–10.3)
Chloride: 93 mmol/L — ABNORMAL LOW (ref 101–111)
Creatinine, Ser: 8.4 mg/dL — ABNORMAL HIGH (ref 0.61–1.24)
GFR calc Af Amer: 7 mL/min — ABNORMAL LOW (ref >60)
GFR calc non Af Amer: 6 mL/min — ABNORMAL LOW (ref >60)
Glucose, Bld: 109 mg/dL — ABNORMAL HIGH (ref 65–99)
Potassium: 3.5 mmol/L (ref 3.5–5.1)
Sodium: 135 mmol/L (ref 135–145)
Total Bilirubin: 0.4 mg/dL (ref 0.3–1.2)
Total Protein: 7.8 g/dL (ref 6.5–8.1)

## 2017-01-10 LAB — DIFFERENTIAL
Basophils Absolute: 0 10*3/uL (ref 0.0–0.1)
Basophils Relative: 0 %
Eosinophils Absolute: 0 10*3/uL (ref 0.0–0.7)
Eosinophils Relative: 0 %
Lymphocytes Relative: 5 %
Lymphs Abs: 0.9 10*3/uL (ref 0.7–4.0)
Monocytes Absolute: 0.9 10*3/uL (ref 0.1–1.0)
Monocytes Relative: 5 %
Neutro Abs: 15.1 10*3/uL — ABNORMAL HIGH (ref 1.7–7.7)
Neutrophils Relative %: 90 %

## 2017-01-10 LAB — CBC
HCT: 32.6 % — ABNORMAL LOW (ref 39.0–52.0)
Hemoglobin: 10.5 g/dL — ABNORMAL LOW (ref 13.0–17.0)
MCH: 29.5 pg (ref 26.0–34.0)
MCHC: 32.2 g/dL (ref 30.0–36.0)
MCV: 91.6 fL (ref 78.0–100.0)
Platelets: 277 10*3/uL (ref 150–400)
RBC: 3.56 MIL/uL — ABNORMAL LOW (ref 4.22–5.81)
RDW: 14.8 % (ref 11.5–15.5)
WBC: 16.3 10*3/uL — ABNORMAL HIGH (ref 4.0–10.5)

## 2017-01-10 LAB — LIPASE, BLOOD: Lipase: 119 U/L — ABNORMAL HIGH (ref 11–51)

## 2017-01-10 MED ORDER — PIPERACILLIN-TAZOBACTAM 3.375 G IVPB: 3.3750 g | Freq: Two times a day (BID) | INTRAVENOUS | Status: DC

## 2017-01-10 MED ORDER — OXYCODONE-ACETAMINOPHEN 5-325 MG PO TABS: 1.0000 | ORAL_TABLET | Freq: Four times a day (QID) | ORAL | 0 refills | Status: DC | PRN

## 2017-01-10 MED ORDER — CIPROFLOXACIN HCL 500 MG PO TABS: 500.0000 mg | ORAL_TABLET | Freq: Two times a day (BID) | ORAL | 0 refills | Status: DC

## 2017-01-10 MED ORDER — PIPERACILLIN-TAZOBACTAM 3.375 G IVPB 30 MIN
3.3750 g | Freq: Once | INTRAVENOUS | Status: AC
Start: 2017-01-10 — End: 2017-01-10
  Administered 2017-01-10: 3.375 g via INTRAVENOUS
  Filled 2017-01-10: qty 50

## 2017-01-10 MED ORDER — OXYCODONE-ACETAMINOPHEN 5-325 MG PO TABS
1.0000 | ORAL_TABLET | Freq: Once | ORAL | Status: AC
  Administered 2017-01-10: 1 via ORAL
  Filled 2017-01-10: qty 1

## 2017-01-10 MED ORDER — IOPAMIDOL (ISOVUE-300) INJECTION 61%
INTRAVENOUS | Status: AC
  Administered 2017-01-10: 100 mL
  Filled 2017-01-10: qty 100

## 2017-01-10 MED ORDER — METRONIDAZOLE 500 MG PO TABS: 500.0000 mg | ORAL_TABLET | Freq: Three times a day (TID) | ORAL | 0 refills | Status: DC

## 2017-01-10 MED ORDER — OXYCODONE-ACETAMINOPHEN 5-325 MG PO TABS
1.0000 | ORAL_TABLET | ORAL | Status: DC | PRN
  Administered 2017-01-10: 1 via ORAL
  Filled 2017-01-10: qty 1

## 2017-01-10 NOTE — ED Triage Notes (Signed)
Patient sent from [facility] for further evaluation of lower abdominal pain following dialysis yesterday. Patient alert and oriented and reports BP runs in 90s normal. Intermittent nausea with intake.

## 2017-01-10 NOTE — ED Notes (Signed)
Pt wishes to leave AMA. Pt is A&Ox4 and of sound mind. Pt has steady gait.  Pt has been talked to by the nurse and the PA and understands/accepts the risks of leaving.

## 2017-01-10 NOTE — ED Notes (Signed)
Patient ambulated to the room without assistance.  Patient changing into gown at this time.

## 2017-01-10 NOTE — ED Triage Notes (Addendum)
Patient reports lower abdominal pain x 2day. Reports hx of diverticulitis. Patient reports pain with bowel movements. Reports clear vomiting. States ibuprofen has held with the pain. Patient is dialysis patient, MWF, went yesterday. Fistula right arm.

## 2017-01-10 NOTE — Discharge Instructions (Signed)
Take Flagyl three times a day Take Cipro twice a day Take pain medicine as needed Return if worsening

## 2017-01-10 NOTE — ED Provider Notes (Signed)
[place] EMERGENCY DEPARTMENT Provider Note   CSN: [id number] Arrival date & time: 01/10/17  1158     History   Chief Complaint Chief Complaint  Patient presents with  . Abdominal Pain    HPI Carl Clugston Sr. is a 56 y.o. male who presents with lower abdominal pain. PMH significant for ESRD MWF (last dialyzed yesterday), anemia, hx of diverticulitis, hx of GIB, secondary hyperaparathyroidism. The patient states that his pain started yesterday. He went to dialysis and completed the full session. It is across the lower abdomen. He had associted nausea, vomiting, constipation. He took Ibuprofen 800mg  with no relief. It feels similar to prior episodes of diverticulitis. He went to UC today who advised him to come to the ED. Past surgical hx significant for prior bowel resection including appendix. No fever, chills, chest pain, SOB, current nausea. LBM was earlier this morning and he is able to pass gas. He is anuric.   HPI  Past Medical History:  Diagnosis Date  . Anemia   . Arthritis   . Chronic headaches   . Diverticulitis   . ESRD (end stage renal disease) ([place])     On Renal Transplant List," [facility]; MWF" (10/23/2016)  . GI bleed   . Hemodialysis patient ([place])    M-W-F  . Hypertension   . Parathyroid abnormality (H[facility])    ectopic parathyroid gland  . Presence of arteriovenous fistula for hemodialysis, primary ([place])    RUE  . Refusal of blood product    NO WHOLE BLOOD PROUCTS  . Secondary hyperparathyroidism ([place])   . Seizures ([place])    one episode in past  . Sleep apnea    doesn't use CPAP anymore since weight loss  . Stroke [facility])    no residual    Patient Active Problem List   Diagnosis Date Noted  . Secondary hyperparathyroidism of renal origin ([place]) 10/23/2016  . Other headache syndrome 08/21/2016  . Hyperparathyroidism, secondary renal ([place]) 06/12/2016  . TIA (transient ischemic attack)   . Seizure-like activity ([place])   . Essential  hypertension   . Seizures ([place])   . Acute pericarditis   . Atypical chest pain 10/01/2015  . Abnormal EKG 10/01/2015  . Chest pain 10/01/2015  . 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 GI bleed 08/01/2015  . Acute blood loss anemia   . Acute diverticulitis   . Lung nodule   . Diverticulitis of cecum 07/21/2015  . Diverticulitis of large intestine without perforation or abscess without bleeding   . Right lower quadrant pain   . Headache 05/05/2015  . 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 Hypertension secondary to other renal disorders 08/26/2014  . Diverticulosis 07/13/2014  . Secondary hyperparathyroidism ([place]) 07/13/2014  . Lower GI bleed 07/12/2014  . Hematochezia   . Acute posthemorrhagic anemia   . End stage renal disease ([place])   . Refusal of blood transfusions as patient is Jehovah's Witness   . Parotid mass   . HLD (hyperlipidemia)   . PRES (posterior reversible encephalopathy syndrome)   . Stroke ([place]) 05/23/2014  . Cerebral infarction ([place]) 05/23/2014  . Anemia of renal disease 05/23/2014  . HTN (hypertension) 12/09/2012  . GERD (gastroesophageal reflux disease) 12/09/2012  . ESRD on dialysis ([place]) 12/09/2012  . Gastrointestinal bleeding 11/12/2012    Past Surgical History:  Procedure Laterality Date  . AV FISTULA PLACEMENT Right    right arm  . CATARACT EXTRACTION W/ INTRAOCULAR LENS  IMPLANT, BILATERAL    .  COLON SURGERY    . COLONOSCOPY N/A 08/04/2015   Procedure: COLONOSCOPY;  Surgeon: Jerene Bears, MD;  [facility] ENDOSCOPY;  Service: Endoscopy;  Laterality: N/A;  . graft left arm Left    for dialysis x 2  . INSERTION OF DIALYSIS CATHETER     Rt chest  . LAPAROSCOPIC RIGHT COLECTOMY N/A 08/05/2015   Procedure: LAPAROSCOPIC RIGHT COLECTOMY- ASCENDING;  Surgeon: Stark Klein, MD;  Location: [facility];  Service: General;  Laterality: N/A;  . PARATHYROIDECTOMY N/A 06/12/2016   Procedure: TOTAL PARATHYROIDECTOMY WITH AUTOTRANSPLANTATION TO  LEFT FOREARM;  Surgeon: Armandina Gemma, MD;  Location: [place];  Service: General;  Laterality: N/A;  . PARATHYROIDECTOMY  10/23/2016  . PARATHYROIDECTOMY N/A 10/23/2016   Procedure: PARATHYROIDECTOMY;  Surgeon: Armandina Gemma, MD;  Location: [facility] OR;  Service: General;  Laterality: N/A;       Home Medications    Prior to Admission medications   Medication Sig Start Date End Date Taking? Authorizing Provider  aspirin EC 81 MG tablet Take 1 tablet (81 mg total) by mouth daily. Patient taking differently: Take 81 mg by mouth every other day.  08/26/14   Rosalin Hawking, MD  atorvastatin (LIPITOR) 10 MG tablet Take 1 tablet (10 mg total) by mouth daily at 6 PM. Patient not taking: Reported on 10/19/2016 05/25/14   Thurnell Lose, MD  butalbital-acetaminophen-caffeine (FIORICET, ESGIC) [phone] MG tablet Take 1 tablet by mouth every 12 (twelve) hours as needed for headache. No more than 2 tabs a day and no more than 5 tabs a week. Patient not taking: Reported on 10/19/2016 01/17/16   Garvin Fila, MD  cinacalcet (SENSIPAR) 90 MG tablet Take 180 mg by mouth daily.    [provider]  docusate sodium (COLACE) 100 MG capsule Take 1 capsule (100 mg total) by mouth 2 (two) times daily as needed for mild constipation. Patient not taking: Reported on 10/19/2016 08/10/15   Lavina Hamman, MD  gabapentin (NEURONTIN) 100 MG capsule Take 100 mg by mouth daily.  09/29/14   [provider]  ibuprofen (ADVIL,MOTRIN) 800 MG tablet Take 800 mg by mouth 3 (three) times daily as needed for mild pain.  10/14/15   [provider]  multivitamin (RENA-VIT) TABS tablet Take 1 tablet by mouth daily.    [provider]  omeprazole (PRILOSEC) 20 MG capsule Take 1 capsule (20 mg total) by mouth daily. 30-60 mins before breakfast. Patient not taking: Reported on 10/19/2016 12/15/15   Levin Erp, PA  sorbitol 70 % solution Take 15-60 mLs by mouth daily as needed ((do not take on/before dialysis  days)).     [provider]  traMADol (ULTRAM) 50 MG tablet Take 1-2 tablets (50-100 mg total) by mouth every 6 (six) hours as needed for moderate pain or severe pain. 10/24/16   Armandina Gemma, MD  traMADol (ULTRAM) 50 MG tablet Take 1 tablet (50 mg total) by mouth every 6 (six) hours as needed (mild pain). 10/24/16   Erroll Luna, MD    Family History Family History  Problem Relation Age of Onset  . Diabetes Father   . Stroke Father   . Hypertension Father   . Uterine cancer Mother   . Lupus Sister   . Stroke Sister   . Hypertension Sister   . Anuerysm Brother        brain    Social History Social History   Tobacco Use  . Smoking status: Former Smoker    Last  attempt to quit: 06/19/1990    Years since quitting: 26.5  . Smokeless tobacco: Never Used  . Tobacco comment: rarely cigar  Substance Use Topics  . Alcohol use: Yes    Alcohol/week: 0.6 oz    Types: 1 Cans of beer per week    Comment: occasional  . Drug use: No     Allergies   Infed [iron dextran]; Oxycodone; Vicodin [hydrocodone-acetaminophen]; Diclofenac; and Whole blood   Review of Systems Review of Systems  Constitutional: Negative for chills and fever.  Respiratory: Negative for shortness of breath.   Cardiovascular: Negative for chest pain.  Gastrointestinal: Positive for abdominal pain, constipation, nausea and vomiting. Negative for blood in stool and diarrhea.  Genitourinary: Positive for decreased urine volume.  Neurological: Negative for headaches.  All other systems reviewed and are negative.    Physical Exam Updated Vital Signs BP 99/71   Pulse (!) 112   Temp 98.6 F (37 C) (Oral)   Resp 18   SpO2 96%   Physical Exam  Constitutional: He is oriented to person, place, and time. He appears well-developed and well-nourished. No distress.  Calm, cooperative  HENT:  Head: Normocephalic and atraumatic.  Eyes: Conjunctivae are normal. Pupils are equal, round, and reactive to light.  Right eye exhibits no discharge. Left eye exhibits no discharge. No scleral icterus.  Neck: Normal range of motion.  Cardiovascular: Normal rate and regular rhythm. Exam reveals no gallop and no friction rub.  No murmur heard. Palpable thrill in RUE fistula  Pulmonary/Chest: Effort normal and breath sounds normal. No stridor. No respiratory distress. He has no wheezes. He has no rales. He exhibits no tenderness.  Abdominal: Soft. Bowel sounds are normal. He exhibits no distension and no mass. There is tenderness (RLQ tenderness). There is no rebound and no guarding. No hernia.  Prior surgical scar  Neurological: He is alert and oriented to person, place, and time.  Skin: Skin is warm and dry.  Psychiatric: He has a normal mood and affect. His behavior is normal.  Nursing note and vitals reviewed.    ED Treatments / Results  Labs (all labs ordered are listed, but only abnormal results are displayed) Labs Reviewed  LIPASE, BLOOD - Abnormal; Notable for the following components:      Result Value   Lipase 119 (*)    All other components within normal limits  COMPREHENSIVE METABOLIC PANEL - Abnormal; Notable for the following components:   Chloride 93 (*)    Glucose, Bld 109 (*)    BUN 25 (*)    Creatinine, Ser 8.40 (*)    Albumin 3.1 (*)    ALT 13 (*)    GFR calc non Af Amer 6 (*)    GFR calc Af Amer 7 (*)    All other components within normal limits  CBC - Abnormal; Notable for the following components:   WBC 16.3 (*)    RBC 3.56 (*)    Hemoglobin 10.5 (*)    HCT 32.6 (*)    All other components within normal limits  DIFFERENTIAL - Abnormal; Notable for the following components:   Neutro Abs 15.1 (*)    All other components within normal limits    EKG  EKG Interpretation None       Radiology Ct Abdomen Pelvis W Contrast  Result Date: 01/10/2017 CLINICAL DATA:  Lower abdominal pain with fever and chills EXAM: CT ABDOMEN AND PELVIS WITH CONTRAST TECHNIQUE:  Multidetector CT imaging of the abdomen and pelvis  was performed using the standard protocol following bolus administration of intravenous contrast. CONTRAST:  192mL ISOVUE-300 IOPAMIDOL (ISOVUE-300) INJECTION 61% COMPARISON:  09/10/2015, 08/09/2015, 07/21/2015, 09/03/2013 FINDINGS: Lower chest: Stable subpleural nodular focus in the right middle lobe. Mild ground-glass density in the left lower lobe may reflect atelectasis or a mild infiltrate. No pleural effusion. Normal heart size. Hepatobiliary: No focal liver abnormality is seen. No gallstones, gallbladder wall thickening, or biliary dilatation. Pancreas: Unremarkable. No pancreatic ductal dilatation or surrounding inflammatory changes. Spleen: Normal in size without focal abnormality. Adrenals/Urinary Tract: Adrenal glands are within normal limits. Re- demonstrated multiple cysts within both kidneys. Scattered parenchymal calcification. No hydronephrosis. The bladder is thick walled but nearly empty Stomach/Bowel: Stomach is nonenlarged. There is slightly thickened appearance of the gastric wall up to 3.1 cm. No dilated small bowel. Status post right colon resection. Diverticular disease of the remaining colon. Focal wall thickening with moderate severe surrounding inflammation at the distal sigmoid colon. Hypodense fluid density, possibly within the wall of the sigmoid colon. Vascular/Lymphatic: Aortic atherosclerosis. No enlarged abdominal or pelvic lymph nodes. Reproductive: Prostate is unremarkable. Other: Negative for free air. Pelvic edema and small amount of fluid. Musculoskeletal: No acute or suspicious lesion. Multiple cysts within the proximal femurs and acetabula. IMPRESSION: 1. Focal wall thickening with associated moderate severe inflammation involving the distal sigmoid colon, felt most consistent with sigmoid colon diverticulitis. No extraluminal gas to suggest perforation. Small foci of fluid density, possibly within the wall of the distal  sigmoid colon, could reflect small wall abscess, however no large drainable fluid collection is seen at this time. Colonoscopy follow-up suggested to exclude colon mass as a cause for the sigmoid colon inflammatory process. 2. 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 3. Mild ground-glass density in the left lower lobe may reflect atelectasis or a mild infiltrate. Electronically Signed   By: Donavan Foil M.D.   On: 01/10/2017 19:33    Procedures Procedures (including critical care time)  Medications Ordered in ED Medications  oxyCODONE-acetaminophen (PERCOCET/ROXICET) 5-325 MG per tablet 1 tablet (1 tablet Oral Given 01/10/17 1445)  piperacillin-tazobactam (ZOSYN) IVPB 3.375 g (not administered)  oxyCODONE-acetaminophen (PERCOCET/ROXICET) 5-325 MG per tablet 1 tablet (1 tablet Oral Given 01/10/17 1719)  iopamidol (ISOVUE-300) 61 % injection (100 mLs  Contrast Given 01/10/17 1833)  piperacillin-tazobactam (ZOSYN) IVPB 3.375 g (0 g Intravenous Stopped 01/10/17 2145)  oxyCODONE-acetaminophen (PERCOCET/ROXICET) 5-325 MG per tablet 1 tablet (1 tablet Oral Given 01/10/17 2158)     Initial Impression / Assessment and Plan / ED Course  I have reviewed the triage vital signs and the nursing notes.  Pertinent labs & imaging results that were available during my care of the patient were reviewed by me and considered in my medical decision making (see chart for details).  56 year old male with severe diverticulitis with possible abscess formation.  He is tachycardic otherwise vital signs are normal.  He is not acutely ill appearing.  CBC is remarkable for leukocytosis of 16.3.  Anemia is stable.  CMP is remarkable for elevated serum creatinine which is around his baseline and elevated lipase is also around his baseline.  Discussed results with patient.  I advised admission due to complicated diverticulitis however the patient is refusing stating that "he has things to do" and has a job to do over the weekend which he  will be fired if he does not go to.  He states that he would prefer if we could give him oral antibiotics and he could come back if he gets  worse.  He did get a dose of Zosyn before he left. He was prescribed Cipro and Flagyl.  We discussed the nature and purpose, risks and benefits, as well as, the alternatives of treatment. Time was given to allow the opportunity to ask questions and consider their options, and after the discussion, the patient decided to refuse the offerred treatment. The patient was informed that refusal could lead to, but was not limited to, death, permanent disability, or severe pain. Prior to refusing, I determined that the patient had the capacity to make their decision and understood the consequences of that decision. After refusal, I made every reasonable opportunity to treat them to the best of my ability.  The patient was notified that they may return to the emergency department at any time for further treatment.     Final Clinical Impressions(s) / ED Diagnoses   Final diagnoses:  Diverticulitis of large intestine with abscess without bleeding    ED Discharge Orders    None       Recardo Evangelist, PA-C 01/10/17 2331    Recardo Evangelist, PA-C 01/10/17 2331    Davonna Belling, MD 01/11/17 [phone]

## 2017-01-10 NOTE — Progress Notes (Signed)
Pharmacy Antibiotic Note  Carl Blackard Sr. is a HD MWF 56 y.o. male admitted on 01/10/2017 with intra-abdominal  infection.  Pharmacy has been consulted for Zosyn dosing.  Afebrile, WBC 16.3   Plan: Zosyn 3.375G IV X1 30 min infusion THEN 3.375G Q12H IV 4 hour infusion   F/U clinical status, C&S, LOT      Temp (24hrs), Avg:98.4 F (36.9 C), Min:98.2 F (36.8 C), Max:98.6 F (37 C)  Recent Labs  Lab 01/10/17 1208  WBC 16.3*  CREATININE 8.40*    CrCl cannot be calculated (Unknown ideal weight.).    Allergies  Allergen Reactions  . Infed [Iron Dextran] Other (See Comments)    Decreased BP   . Oxycodone Nausea Only  . Vicodin [Hydrocodone-Acetaminophen] Other (See Comments)    Decreased BP  . Diclofenac Other (See Comments)    Unknown  . Whole Blood Other (See Comments)    Patient refuses whole blood "unless I am going to die"    Antimicrobials this admission: Zosyn 11/29 >>  Dose adjustments this admission:  Microbiology results:   Thank you for allowing pharmacy to be a part of this patient's care.  Felicity Pellegrini  PharmD Candidate '19 12/17/2016 3:30 PM

## 2017-01-10 NOTE — ED Notes (Signed)
Called 3x no response

## 2017-01-10 NOTE — ED Provider Notes (Signed)
[facility]    CSN: [id number] Arrival date & time: 01/10/17  1005     History   Chief Complaint Chief Complaint  Patient presents with  . Abdominal Pain    HPI Carl Khachatryan Sr. is a 56 y.o. male.   56 year-old male, with history of ESRD on dialysis M, W, F, diverticulitis, anemia, presenting today due to lower abdominal pain. Pain has been present for the past two days. He has had subjective fever and chills as well. States that this feels the same as previous episodes of diverticulitis. Pain with bowel movements     The history is provided by the patient.  Abdominal Pain  Pain location:  LLQ and RLQ Pain quality: aching   Pain radiates to:  Does not radiate Pain severity:  Moderate Onset quality:  Gradual Duration:  2 days Timing:  Constant Progression:  Unchanged Chronicity:  Recurrent Context: eating   Context: not alcohol use, not awakening from sleep, not diet changes and not laxative use   Relieved by:  Nothing Worsened by:  Bowel movements and movement Ineffective treatments:  None tried Associated symptoms: chills and diarrhea   Associated symptoms: no chest pain, no constipation, no cough, no dysuria, no fatigue, no fever, no hematemesis, no hematuria, no melena, no shortness of breath, no sore throat and no vomiting   Risk factors: no alcohol abuse, no aspirin use and not elderly     Past Medical History:  Diagnosis Date  . Anemia   . Arthritis   . Chronic headaches   . Diverticulitis   . ESRD (end stage renal disease) ([place])     On Renal Transplant List," [facility]; MWF" (10/23/2016)  . GI bleed   . Hemodialysis patient ([place])    M-W-F  . Hypertension   . Parathyroid abnormality ([facility])    ectopic parathyroid gland  . Presence of arteriovenous fistula for hemodialysis, primary ([place])    RUE  . Refusal of blood product    NO WHOLE BLOOD PROUCTS  . Secondary hyperparathyroidism ([place])   . Seizures ([place])    one episode in past  . Sleep  apnea    doesn't use CPAP anymore since weight loss  . Stroke [facility])    no residual    Patient Active Problem List   Diagnosis Date Noted  . Secondary hyperparathyroidism of renal origin ([place]) 10/23/2016  . Other headache syndrome 08/21/2016  . Hyperparathyroidism, secondary renal ([place]) 06/12/2016  . TIA (transient ischemic attack)   . Seizure-like activity ([place])   . Essential hypertension   . Seizures ([place])   . Acute pericarditis   . Atypical chest pain 10/01/2015  . Abnormal EKG 10/01/2015  . Chest pain 10/01/2015  . 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 GI bleed 08/01/2015  . Acute blood loss anemia   . Acute diverticulitis   . Lung nodule   . Diverticulitis of cecum 07/21/2015  . Diverticulitis of large intestine without perforation or abscess without bleeding   . Right lower quadrant pain   . Headache 05/05/2015  . 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 Hypertension secondary to other renal disorders 08/26/2014  . Diverticulosis 07/13/2014  . Secondary hyperparathyroidism ([place]) 07/13/2014  . Lower GI bleed 07/12/2014  . Hematochezia   . Acute posthemorrhagic anemia   . End stage renal disease ([place])   . Refusal of blood transfusions as patient is Jehovah's Witness   . Parotid mass   . HLD (hyperlipidemia)   .  PRES (posterior reversible encephalopathy syndrome)   . Stroke ([place]) 05/23/2014  . Cerebral infarction ([place]) 05/23/2014  . Anemia of renal disease 05/23/2014  . HTN (hypertension) 12/09/2012  . GERD (gastroesophageal reflux disease) 12/09/2012  . ESRD on dialysis ([place]) 12/09/2012  . Gastrointestinal bleeding 11/12/2012    Past Surgical History:  Procedure Laterality Date  . AV FISTULA PLACEMENT Right    right arm  . CATARACT EXTRACTION W/ INTRAOCULAR LENS  IMPLANT, BILATERAL    . COLON SURGERY    . COLONOSCOPY N/A 08/04/2015   Procedure: COLONOSCOPY;  Surgeon: Jerene Bears, MD;  Location: [facility] ENDOSCOPY;  Service: Endoscopy;   Laterality: N/A;  . graft left arm Left    for dialysis x 2  . INSERTION OF DIALYSIS CATHETER     Rt chest  . LAPAROSCOPIC RIGHT COLECTOMY N/A 08/05/2015   Procedure: LAPAROSCOPIC RIGHT COLECTOMY- ASCENDING;  Surgeon: Stark Klein, MD;  Location: [place];  Service: General;  Laterality: N/A;  . PARATHYROIDECTOMY N/A 06/12/2016   Procedure: TOTAL PARATHYROIDECTOMY WITH AUTOTRANSPLANTATION TO LEFT FOREARM;  Surgeon: Armandina Gemma, MD;  Location: [place];  Service: General;  Laterality: N/A;  . PARATHYROIDECTOMY  10/23/2016  . PARATHYROIDECTOMY N/A 10/23/2016   Procedure: PARATHYROIDECTOMY;  Surgeon: Armandina Gemma, MD;  Location: [facility] OR;  Service: General;  Laterality: N/A;       Home Medications    Prior to Admission medications   Medication Sig Start Date End Date Taking? Authorizing Provider  aspirin EC 81 MG tablet Take 1 tablet (81 mg total) by mouth daily. Patient taking differently: Take 81 mg by mouth every other day.  08/26/14  Yes Rosalin Hawking, MD  cinacalcet (SENSIPAR) 90 MG tablet Take 180 mg by mouth daily.   Yes [provider]  gabapentin (NEURONTIN) 100 MG capsule Take 100 mg by mouth daily.  09/29/14  Yes [provider]  ibuprofen (ADVIL,MOTRIN) 800 MG tablet Take 800 mg by mouth 3 (three) times daily as needed for mild pain.  10/14/15  Yes [provider]  multivitamin (RENA-VIT) TABS tablet Take 1 tablet by mouth daily.   Yes [provider]  atorvastatin (LIPITOR) 10 MG tablet Take 1 tablet (10 mg total) by mouth daily at 6 PM. Patient not taking: Reported on 10/19/2016 05/25/14   Thurnell Lose, MD  butalbital-acetaminophen-caffeine (FIORICET, ESGIC) [phone] MG tablet Take 1 tablet by mouth every 12 (twelve) hours as needed for headache. No more than 2 tabs a day and no more than 5 tabs a week. Patient not taking: Reported on 10/19/2016 01/17/16   Garvin Fila, MD  docusate sodium (COLACE) 100 MG capsule Take 1 capsule (100 mg total) by mouth 2  (two) times daily as needed for mild constipation. Patient not taking: Reported on 10/19/2016 08/10/15   Lavina Hamman, MD  omeprazole (PRILOSEC) 20 MG capsule Take 1 capsule (20 mg total) by mouth daily. 30-60 mins before breakfast. Patient not taking: Reported on 10/19/2016 12/15/15   Levin Erp, PA  sorbitol 70 % solution Take 15-60 mLs by mouth daily as needed ((do not take on/before dialysis days)).     [provider]  traMADol (ULTRAM) 50 MG tablet Take 1-2 tablets (50-100 mg total) by mouth every 6 (six) hours as needed for moderate pain or severe pain. 10/24/16   Armandina Gemma, MD  traMADol (ULTRAM) 50 MG tablet Take 1 tablet (50 mg total) by mouth every 6 (six) hours as needed (mild pain). 10/24/16   Cornett, Marcello Moores,  MD    Family History Family History  Problem Relation Age of Onset  . Diabetes Father   . Stroke Father   . Hypertension Father   . Uterine cancer Mother   . Lupus Sister   . Stroke Sister   . Hypertension Sister   . Anuerysm Brother        brain    Social History Social History   Tobacco Use  . Smoking status: Former Smoker    Last attempt to quit: 06/19/1990    Years since quitting: 26.5  . Smokeless tobacco: Never Used  . Tobacco comment: rarely cigar  Substance Use Topics  . Alcohol use: Yes    Alcohol/week: 0.6 oz    Types: 1 Cans of beer per week    Comment: occasional  . Drug use: No     Allergies   Infed [iron dextran]; Oxycodone; Vicodin [hydrocodone-acetaminophen]; Diclofenac; and Whole blood   Review of Systems Review of Systems  Constitutional: Positive for chills. Negative for fatigue and fever.  HENT: Negative for ear pain and sore throat.   Eyes: Negative for pain and visual disturbance.  Respiratory: Negative for cough and shortness of breath.   Cardiovascular: Negative for chest pain and palpitations.  Gastrointestinal: Positive for abdominal pain and diarrhea. Negative for constipation, hematemesis, melena and  vomiting.  Genitourinary: Negative for dysuria and hematuria.  Musculoskeletal: Negative for arthralgias and back pain.  Skin: Negative for color change and rash.  Neurological: Negative for seizures and syncope.  All other systems reviewed and are negative.    Physical Exam Triage Vital Signs ED Triage Vitals  Enc Vitals Group     BP 01/10/17 1114 (!) 120/100     Pulse Rate 01/10/17 1114 (!) 111     Resp 01/10/17 1114 17     Temp 01/10/17 1114 98.2 F (36.8 C)     Temp Source 01/10/17 1114 Oral     SpO2 01/10/17 1114 100 %     Weight --      Height --      Head Circumference --      Peak Flow --      Pain Score 01/10/17 1111 8     Pain Loc --      Pain Edu? --      Excl. in [place]? --    No data found.  Updated Vital Signs BP (!) 120/100 (BP Location: Left Arm)   Pulse (!) 111   Temp 98.2 F (36.8 C) (Oral)   Resp 17   SpO2 100%   Visual Acuity Right Eye Distance:   Left Eye Distance:   Bilateral Distance:    Right Eye Near:   Left Eye Near:    Bilateral Near:     Physical Exam  Constitutional: He is oriented to person, place, and time. He appears well-developed and well-nourished.  HENT:  Head: Normocephalic and atraumatic.  Eyes: Conjunctivae are normal.  Neck: Neck supple.  Cardiovascular: Normal rate and regular rhythm.  No murmur heard. Pulmonary/Chest: Effort normal and breath sounds normal. No respiratory distress.  Abdominal: Soft. There is tenderness. There is no rigidity, no rebound and no guarding.    Musculoskeletal: He exhibits no edema.       Lumbar back: He exhibits tenderness.  Neurological: He is alert and oriented to person, place, and time. He has normal strength and normal reflexes. No cranial nerve deficit or sensory deficit. He displays a negative Romberg sign. GCS eye subscore is 4. GCS  verbal subscore is 5. GCS motor subscore is 6.  Skin: Skin is warm and dry.  Psychiatric: He has a normal mood and affect.  Nursing note and vitals  reviewed.    UC Treatments / Results  Labs (all labs ordered are listed, but only abnormal results are displayed) Labs Reviewed - No data to display  EKG  EKG Interpretation None       Radiology No results found.  Procedures Procedures (including critical care time)  Medications Ordered in UC Medications - No data to display   Initial Impression / Assessment and Plan / UC Course  I have reviewed the triage vital signs and the nursing notes.  Pertinent labs & imaging results that were available during my care of the patient were reviewed by me and considered in my medical decision making (see chart for details).     Lower abdominal pain - history of diverticulitis - patient is tachycardic Will send to the ER for further eval for likely diverticulitis   Final Clinical Impressions(s) / UC Diagnoses   Final diagnoses:  Lower abdominal pain  History of diverticulitis    ED Discharge Orders    None       Controlled Substance Prescriptions [place] Controlled Substance Registry consulted? Not Applicable   Phebe Colla, [place] 01/10/17 1141

## 2017-01-11 DIAGNOSIS — E875 Hyperkalemia: Secondary | ICD-10-CM | POA: Diagnosis not present

## 2017-01-11 DIAGNOSIS — D631 Anemia in chronic kidney disease: Secondary | ICD-10-CM | POA: Diagnosis not present

## 2017-01-11 DIAGNOSIS — I12 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 stage renal disease: Secondary | ICD-10-CM | POA: Diagnosis not present

## 2017-01-11 DIAGNOSIS — N186 End stage renal disease: Secondary | ICD-10-CM | POA: Diagnosis not present

## 2017-01-11 DIAGNOSIS — Z992 Dependence on renal dialysis: Secondary | ICD-10-CM | POA: Diagnosis not present

## 2017-01-11 DIAGNOSIS — N2581 Secondary hyperparathyroidism of renal origin: Secondary | ICD-10-CM | POA: Diagnosis not present

## 2017-01-11 DIAGNOSIS — E1129 Type 2 diabetes mellitus with other diabetic kidney complication: Secondary | ICD-10-CM | POA: Diagnosis not present

## 2017-01-14 DIAGNOSIS — N186 End stage renal disease: Secondary | ICD-10-CM | POA: Diagnosis not present

## 2017-01-14 DIAGNOSIS — D631 Anemia in chronic kidney disease: Secondary | ICD-10-CM | POA: Diagnosis not present

## 2017-01-14 DIAGNOSIS — N2581 Secondary hyperparathyroidism of renal origin: Secondary | ICD-10-CM | POA: Diagnosis not present

## 2017-01-14 DIAGNOSIS — E1129 Type 2 diabetes mellitus with other diabetic kidney complication: Secondary | ICD-10-CM | POA: Diagnosis not present

## 2017-01-14 DIAGNOSIS — E875 Hyperkalemia: Secondary | ICD-10-CM | POA: Diagnosis not present

## 2017-01-16 DIAGNOSIS — N2581 Secondary hyperparathyroidism of renal origin: Secondary | ICD-10-CM | POA: Diagnosis not present

## 2017-01-16 DIAGNOSIS — D631 Anemia in chronic kidney disease: Secondary | ICD-10-CM | POA: Diagnosis not present

## 2017-01-16 DIAGNOSIS — E875 Hyperkalemia: Secondary | ICD-10-CM | POA: Diagnosis not present

## 2017-01-16 DIAGNOSIS — E1129 Type 2 diabetes mellitus with other diabetic kidney complication: Secondary | ICD-10-CM | POA: Diagnosis not present

## 2017-01-16 DIAGNOSIS — N186 End stage renal disease: Secondary | ICD-10-CM | POA: Diagnosis not present

## 2017-01-18 DIAGNOSIS — N2581 Secondary hyperparathyroidism of renal origin: Secondary | ICD-10-CM | POA: Diagnosis not present

## 2017-01-18 DIAGNOSIS — E1129 Type 2 diabetes mellitus with other diabetic kidney complication: Secondary | ICD-10-CM | POA: Diagnosis not present

## 2017-01-18 DIAGNOSIS — D631 Anemia in chronic kidney disease: Secondary | ICD-10-CM | POA: Diagnosis not present

## 2017-01-18 DIAGNOSIS — E875 Hyperkalemia: Secondary | ICD-10-CM | POA: Diagnosis not present

## 2017-01-18 DIAGNOSIS — N186 End stage renal disease: Secondary | ICD-10-CM | POA: Diagnosis not present

## 2017-01-21 DIAGNOSIS — N2581 Secondary hyperparathyroidism of renal origin: Secondary | ICD-10-CM | POA: Diagnosis not present

## 2017-01-21 DIAGNOSIS — D631 Anemia in chronic kidney disease: Secondary | ICD-10-CM | POA: Diagnosis not present

## 2017-01-21 DIAGNOSIS — E875 Hyperkalemia: Secondary | ICD-10-CM | POA: Diagnosis not present

## 2017-01-21 DIAGNOSIS — E1129 Type 2 diabetes mellitus with other diabetic kidney complication: Secondary | ICD-10-CM | POA: Diagnosis not present

## 2017-01-21 DIAGNOSIS — N186 End stage renal disease: Secondary | ICD-10-CM | POA: Diagnosis not present

## 2017-01-23 DIAGNOSIS — E875 Hyperkalemia: Secondary | ICD-10-CM | POA: Diagnosis not present

## 2017-01-23 DIAGNOSIS — N186 End stage renal disease: Secondary | ICD-10-CM | POA: Diagnosis not present

## 2017-01-23 DIAGNOSIS — N2581 Secondary hyperparathyroidism of renal origin: Secondary | ICD-10-CM | POA: Diagnosis not present

## 2017-01-23 DIAGNOSIS — D631 Anemia in chronic kidney disease: Secondary | ICD-10-CM | POA: Diagnosis not present

## 2017-01-23 DIAGNOSIS — E1129 Type 2 diabetes mellitus with other diabetic kidney complication: Secondary | ICD-10-CM | POA: Diagnosis not present

## 2017-01-25 DIAGNOSIS — E1129 Type 2 diabetes mellitus with other diabetic kidney complication: Secondary | ICD-10-CM | POA: Diagnosis not present

## 2017-01-25 DIAGNOSIS — D631 Anemia in chronic kidney disease: Secondary | ICD-10-CM | POA: Diagnosis not present

## 2017-01-25 DIAGNOSIS — N186 End stage renal disease: Secondary | ICD-10-CM | POA: Diagnosis not present

## 2017-01-25 DIAGNOSIS — N2581 Secondary hyperparathyroidism of renal origin: Secondary | ICD-10-CM | POA: Diagnosis not present

## 2017-01-25 DIAGNOSIS — E875 Hyperkalemia: Secondary | ICD-10-CM | POA: Diagnosis not present

## 2017-01-28 DIAGNOSIS — N186 End stage renal disease: Secondary | ICD-10-CM | POA: Diagnosis not present

## 2017-01-28 DIAGNOSIS — N2581 Secondary hyperparathyroidism of renal origin: Secondary | ICD-10-CM | POA: Diagnosis not present

## 2017-01-28 DIAGNOSIS — E1129 Type 2 diabetes mellitus with other diabetic kidney complication: Secondary | ICD-10-CM | POA: Diagnosis not present

## 2017-01-28 DIAGNOSIS — D631 Anemia in chronic kidney disease: Secondary | ICD-10-CM | POA: Diagnosis not present

## 2017-01-28 DIAGNOSIS — E875 Hyperkalemia: Secondary | ICD-10-CM | POA: Diagnosis not present

## 2017-01-30 DIAGNOSIS — N186 End stage renal disease: Secondary | ICD-10-CM | POA: Diagnosis not present

## 2017-01-30 DIAGNOSIS — E875 Hyperkalemia: Secondary | ICD-10-CM | POA: Diagnosis not present

## 2017-01-30 DIAGNOSIS — D631 Anemia in chronic kidney disease: Secondary | ICD-10-CM | POA: Diagnosis not present

## 2017-01-30 DIAGNOSIS — E1129 Type 2 diabetes mellitus with other diabetic kidney complication: Secondary | ICD-10-CM | POA: Diagnosis not present

## 2017-01-30 DIAGNOSIS — N2581 Secondary hyperparathyroidism of renal origin: Secondary | ICD-10-CM | POA: Diagnosis not present

## 2017-02-01 DIAGNOSIS — N2581 Secondary hyperparathyroidism of renal origin: Secondary | ICD-10-CM | POA: Diagnosis not present

## 2017-02-01 DIAGNOSIS — D631 Anemia in chronic kidney disease: Secondary | ICD-10-CM | POA: Diagnosis not present

## 2017-02-01 DIAGNOSIS — E875 Hyperkalemia: Secondary | ICD-10-CM | POA: Diagnosis not present

## 2017-02-01 DIAGNOSIS — E1129 Type 2 diabetes mellitus with other diabetic kidney complication: Secondary | ICD-10-CM | POA: Diagnosis not present

## 2017-02-01 DIAGNOSIS — N186 End stage renal disease: Secondary | ICD-10-CM | POA: Diagnosis not present

## 2017-02-03 DIAGNOSIS — N186 End stage renal disease: Secondary | ICD-10-CM | POA: Diagnosis not present

## 2017-02-03 DIAGNOSIS — E875 Hyperkalemia: Secondary | ICD-10-CM | POA: Diagnosis not present

## 2017-02-03 DIAGNOSIS — E1129 Type 2 diabetes mellitus with other diabetic kidney complication: Secondary | ICD-10-CM | POA: Diagnosis not present

## 2017-02-03 DIAGNOSIS — N2581 Secondary hyperparathyroidism of renal origin: Secondary | ICD-10-CM | POA: Diagnosis not present

## 2017-02-03 DIAGNOSIS — D631 Anemia in chronic kidney disease: Secondary | ICD-10-CM | POA: Diagnosis not present

## 2017-02-06 DIAGNOSIS — D631 Anemia in chronic kidney disease: Secondary | ICD-10-CM | POA: Diagnosis not present

## 2017-02-06 DIAGNOSIS — N2581 Secondary hyperparathyroidism of renal origin: Secondary | ICD-10-CM | POA: Diagnosis not present

## 2017-02-06 DIAGNOSIS — N186 End stage renal disease: Secondary | ICD-10-CM | POA: Diagnosis not present

## 2017-02-06 DIAGNOSIS — E1129 Type 2 diabetes mellitus with other diabetic kidney complication: Secondary | ICD-10-CM | POA: Diagnosis not present

## 2017-02-06 DIAGNOSIS — E875 Hyperkalemia: Secondary | ICD-10-CM | POA: Diagnosis not present

## 2017-02-08 DIAGNOSIS — N186 End stage renal disease: Secondary | ICD-10-CM | POA: Diagnosis not present

## 2017-02-08 DIAGNOSIS — N2581 Secondary hyperparathyroidism of renal origin: Secondary | ICD-10-CM | POA: Diagnosis not present

## 2017-02-08 DIAGNOSIS — E1129 Type 2 diabetes mellitus with other diabetic kidney complication: Secondary | ICD-10-CM | POA: Diagnosis not present

## 2017-02-08 DIAGNOSIS — D631 Anemia in chronic kidney disease: Secondary | ICD-10-CM | POA: Diagnosis not present

## 2017-02-08 DIAGNOSIS — E875 Hyperkalemia: Secondary | ICD-10-CM | POA: Diagnosis not present

## 2017-02-10 DIAGNOSIS — N2581 Secondary hyperparathyroidism of renal origin: Secondary | ICD-10-CM | POA: Diagnosis not present

## 2017-02-10 DIAGNOSIS — D631 Anemia in chronic kidney disease: Secondary | ICD-10-CM | POA: Diagnosis not present

## 2017-02-10 DIAGNOSIS — N186 End stage renal disease: Secondary | ICD-10-CM | POA: Diagnosis not present

## 2017-02-10 DIAGNOSIS — E1129 Type 2 diabetes mellitus with other diabetic kidney complication: Secondary | ICD-10-CM | POA: Diagnosis not present

## 2017-02-10 DIAGNOSIS — E875 Hyperkalemia: Secondary | ICD-10-CM | POA: Diagnosis not present

## 2017-02-11 DIAGNOSIS — Z992 Dependence on renal dialysis: Secondary | ICD-10-CM | POA: Diagnosis not present

## 2017-02-11 DIAGNOSIS — N186 End stage renal disease: Secondary | ICD-10-CM | POA: Diagnosis not present

## 2017-02-11 DIAGNOSIS — I12 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 stage renal disease: Secondary | ICD-10-CM | POA: Diagnosis not present

## 2017-02-13 DIAGNOSIS — Z23 Encounter for immunization: Secondary | ICD-10-CM | POA: Diagnosis not present

## 2017-02-13 DIAGNOSIS — E875 Hyperkalemia: Secondary | ICD-10-CM | POA: Diagnosis not present

## 2017-02-13 DIAGNOSIS — E1129 Type 2 diabetes mellitus with other diabetic kidney complication: Secondary | ICD-10-CM | POA: Diagnosis not present

## 2017-02-13 DIAGNOSIS — D631 Anemia in chronic kidney disease: Secondary | ICD-10-CM | POA: Diagnosis not present

## 2017-02-13 DIAGNOSIS — N186 End stage renal disease: Secondary | ICD-10-CM | POA: Diagnosis not present

## 2017-02-13 DIAGNOSIS — N2581 Secondary hyperparathyroidism of renal origin: Secondary | ICD-10-CM | POA: Diagnosis not present

## 2017-02-13 DIAGNOSIS — D509 Iron deficiency anemia, unspecified: Secondary | ICD-10-CM | POA: Diagnosis not present

## 2017-02-15 DIAGNOSIS — D509 Iron deficiency anemia, unspecified: Secondary | ICD-10-CM | POA: Diagnosis not present

## 2017-02-15 DIAGNOSIS — N186 End stage renal disease: Secondary | ICD-10-CM | POA: Diagnosis not present

## 2017-02-15 DIAGNOSIS — E1129 Type 2 diabetes mellitus with other diabetic kidney complication: Secondary | ICD-10-CM | POA: Diagnosis not present

## 2017-02-15 DIAGNOSIS — Z23 Encounter for immunization: Secondary | ICD-10-CM | POA: Diagnosis not present

## 2017-02-15 DIAGNOSIS — E875 Hyperkalemia: Secondary | ICD-10-CM | POA: Diagnosis not present

## 2017-02-15 DIAGNOSIS — N2581 Secondary hyperparathyroidism of renal origin: Secondary | ICD-10-CM | POA: Diagnosis not present

## 2017-02-18 DIAGNOSIS — Z23 Encounter for immunization: Secondary | ICD-10-CM | POA: Diagnosis not present

## 2017-02-18 DIAGNOSIS — N186 End stage renal disease: Secondary | ICD-10-CM | POA: Diagnosis not present

## 2017-02-18 DIAGNOSIS — D509 Iron deficiency anemia, unspecified: Secondary | ICD-10-CM | POA: Diagnosis not present

## 2017-02-18 DIAGNOSIS — E875 Hyperkalemia: Secondary | ICD-10-CM | POA: Diagnosis not present

## 2017-02-18 DIAGNOSIS — N2581 Secondary hyperparathyroidism of renal origin: Secondary | ICD-10-CM | POA: Diagnosis not present

## 2017-02-18 DIAGNOSIS — E1129 Type 2 diabetes mellitus with other diabetic kidney complication: Secondary | ICD-10-CM | POA: Diagnosis not present

## 2017-02-19 ENCOUNTER — Ambulatory Visit: Payer: Medicare Other | Admitting: Neurology

## 2017-02-19 ENCOUNTER — Telehealth: Payer: Self-pay

## 2017-02-19 NOTE — Telephone Encounter (Signed)
Patient was no show for appt today.

## 2017-02-20 ENCOUNTER — Ambulatory Visit (INDEPENDENT_AMBULATORY_CARE_PROVIDER_SITE_OTHER): Payer: Medicare Other | Admitting: Neurology

## 2017-02-20 ENCOUNTER — Encounter: Payer: Self-pay | Admitting: Neurology

## 2017-02-20 VITALS — BP 136/88 | HR 99 | Wt 197.4 lb

## 2017-02-20 DIAGNOSIS — D509 Iron deficiency anemia, unspecified: Secondary | ICD-10-CM | POA: Diagnosis not present

## 2017-02-20 DIAGNOSIS — E875 Hyperkalemia: Secondary | ICD-10-CM | POA: Diagnosis not present

## 2017-02-20 DIAGNOSIS — N186 End stage renal disease: Secondary | ICD-10-CM

## 2017-02-20 DIAGNOSIS — G08 Intracranial and intraspinal phlebitis and thrombophlebitis: Secondary | ICD-10-CM

## 2017-02-20 DIAGNOSIS — G4489 Other headache syndrome: Secondary | ICD-10-CM

## 2017-02-20 DIAGNOSIS — R569 Unspecified convulsions: Secondary | ICD-10-CM

## 2017-02-20 DIAGNOSIS — E1129 Type 2 diabetes mellitus with other diabetic kidney complication: Secondary | ICD-10-CM | POA: Diagnosis not present

## 2017-02-20 DIAGNOSIS — G039 Meningitis, unspecified: Secondary | ICD-10-CM

## 2017-02-20 DIAGNOSIS — Z992 Dependence on renal dialysis: Secondary | ICD-10-CM | POA: Diagnosis not present

## 2017-02-20 DIAGNOSIS — Z23 Encounter for immunization: Secondary | ICD-10-CM | POA: Diagnosis not present

## 2017-02-20 DIAGNOSIS — N2581 Secondary hyperparathyroidism of renal origin: Secondary | ICD-10-CM | POA: Diagnosis not present

## 2017-02-20 MED ORDER — TOPIRAMATE 25 MG PO TABS: ORAL_TABLET | ORAL | 3 refills | Status: DC

## 2017-02-20 NOTE — Patient Instructions (Addendum)
-  will repeat MRI and MRV -will start topamax 25mg  twice a day for 7 days then increase to 50mg  twice a day after for headaches -take AM dose of topamax after dialysis on Mon/Wed/Fri -schedule appointment with Dr. Katy Fitch to look at eyes -follow up in 2 months with me

## 2017-02-20 NOTE — Progress Notes (Signed)
STROKE NEUROLOGY FOLLOW UP NOTE  NAME: Carl Cayson Sr. DOB: [birth year]  REASON FOR VISIT: stroke follow up HISTORY FROM: pt and chart  Today we had the pleasure of seeing Carl Aquilino Sr. in follow-up at our Neurology Clinic. Pt was accompanied by no one.   History Summary [name] is a 57 y.o. male with history of HTN, ESRD on HD was admitted for acute onset of dizziness on 05/22/14. He was helping his brother for car repair and struck his head at the top to the hood. A couple of hours later, he felt very dizzy and not right. 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 Also, right occipital pole has tiny DWI changes which may related to PRES. He was admitted for further evaluation. Stroke work up including MRA, TTE and CUS unremarkable. LDL 91. During hospitalization, his dizziness resolved and he was discharged with ASA 325 and lipitor.   08/16/14 follow up - the patient has been doing well. No recurrent stroke symptoms or seizure. However, he had LGIB in 06/2014 and was admitted. Hb as low as to 5.4. He refused blood transfusion. His ASA was d/c'ed. LGIB gradually stopped and last check Hb was 10.4. Currently, he feels good without any abdominal pain and no bleeding. He is in the waiting list for kidney transplantation. BP today 130/86.  11/02/14 follow up - the patient has been doing well. No recurrent symptoms. His GIB resolved and he is on ASA without problems. BP 116/75 today. No HA or seizure. No vision changes.    05/05/15 follow up - he has been doing well. He went to ED on 02/05/15 for migraine headache. CT brain no acute findings. Improved after pain management in ED. Given fioricet for HA and as per report he felt a lot better with the medication. Otherwise, pt has no complains. Continued on HD 3/week.   10/20/15 follow up - he was admitted in 07/2015 for acute LGIB and  blood loss anemia. Had GI consult and colonoscopy showed colon ulcer and nearly perforated cecal mass. Had colectomy with anastomosis and biopsy showed no malignancy. Treated with IV zosyn for 7 days. His ASA resumed. He went to ED again in 08/2015 for abdominal pain and CT found to have diverticulitis, and he was treated with cipro and flagyl. In 09/2015 he was admitted for atypical chest pain, EKG showed diffuse ST elevations concerning for pericardial effusion. Cardiac enzyme negative. However, TTE did not show pericardial effusion. He was treated with low dose colchicine for ? Uremic pericarditis.   Pt neurologically stable since last visit, BP good or low at home, today in clinic 104/71. Had b/l cataract surgery about 3 months ago as per pt. Still has intermittent HA, same as before, sometimes more severe than others. Stated fioricet helped in the past but no more refills. No fever or vision changes.   12/02/15 admission - he was admitted for sudden onset RUE jerking, numbness and weakness, resolved prior to ED arrival. There was also equivocal right facial twitching at that time. MRI and MRA no acute abnormality, but still chronic dural thickening. EEG normal. LDL 72 and A1c 4.8. Due to concerning for seizure, he was put on Keppra 750 mg.  02/2016 ED visit - Since discharge, patient HA getting more frequent, almost daily, comes and goes, at top of head, punding, 6-7/10. Starting this month, HA more severe than  before, he has to use fioricet and ibuprofen. On 02/16/16, he went to ED due to severe HA at midnight. Was given migraine cocktail and improved before discharge.   02/21/16 follow up - pt has been doing well. No more seizure like activity. Still has HA but mild. Had eye exam 2 weeks ago at Dr. Katy Fitch office, but not remember the result. He is still on keppra 750mg  daily, no addition dose after HD. BP 113/70.   08/21/16: During the interval time, pt has been doing well. HA is gone after stopping keppra.  He underwent parathyroidectomy in 06/2016. He was also back to kidney transplant list last month. He has no other complains, BP today 105/63.   Interval Review: Since last appointment, HA has been present every other day. Tylenol extra strength 2 tabs, ibuprofen 800mg , Excedrin 2 tabs  - will take one med and if doesn't work will take another. Cool compression will relieve. Works for at least 4-5 hours then HA returns - same severity prior to taking medication. At times, will wake up without HA but other times will have faint HA. He is unsure if increase in HA is due to stress with kidney issues. Every other day HA has been happening for past few months. N/V present with dry heaving. At times sensitive to lights. Denies auras. Has not noticed if anything makes HA worse. Denies correlation between HA and HD treatments. Pain located in frontal forehead from temrpal to tempral. At times radiates to top of head. Radiates down left side of neck.    REVIEW OF SYSTEMS: Full 14 system review of systems performed and notable only for those listed below and in HPI above, all others are negative:  Constitutional: HA/migraine Cardiovascular:  Ear/Nose/Throat:   Skin:  Eyes:   Respiratory:   Gastroitestinal:   Genitourinary:  Hematology/Lymphatic:   Endocrine:  Musculoskeletal:   Allergy/Immunology:   Neurological:   Psychiatric:  Sleep:   The following represents the patient's updated allergies and side effects list: Allergies  Allergen Reactions  . Infed [Iron Dextran] Other (See Comments)    Decreased BP   . Oxycodone Nausea Only  . Vicodin [Hydrocodone-Acetaminophen] Other (See Comments)    Decreased BP  . Diclofenac Other (See Comments)    Unknown  . Whole Blood Other (See Comments)    Patient refuses whole blood "unless I am going to die"    The neurologically relevant items on the patient's problem list were reviewed on today's visit.  Neurologic Examination  A problem focused  neurological exam (12 or more points of the single system neurologic examination, vital signs counts as 1 point, cranial nerves count for 8 points) was performed.  Blood pressure 136/88, pulse 99, weight 197 lb 6.4 oz (89.5 kg).  General - Well nourished, well developed, in no apparent distress.  Ophthalmologic - difficult to visualize left fundus due to small pupil. However, left side disc margins indistinct with disc size enlargement on first glimpse, but no frank papilledema.   Cardiovascular - Regular rate and rhythm with no murmur.  Mental Status -  Level of arousal and orientation to time, place, and person were intact. Language including expression, naming, repetition, comprehension was assessed and found intact. Attention span and concentration were normal. Fund of Knowledge was assessed and was intact.  Cranial Nerves II - XII - II - Visual field intact OU. III, IV, VI - Extraocular movements intact. V - Facial sensation intact bilaterally. VII - Facial movement intact bilaterally. VIII -  Hearing & vestibular intact bilaterally. X - Palate elevates symmetrically. XI - Chin turning & shoulder shrug intact bilaterally. XII - Tongue protrusion intact.  Motor Strength - The patient's strength was normal in all extremities and pronator drift was absent.  Bulk was normal and fasciculations were absent.   Motor Tone - Muscle tone was assessed at the neck and appendages and was normal.  Reflexes - The patient's reflexes were 1+ in all extremities and he had no pathological reflexes.  Sensory - Light touch, temperature/pinprick were assessed and were normal.    Coordination - The patient had normal movements in the hands and feet with no ataxia or dysmetria.  Tremor was absent.  Gait and Station - The patient's transfers, posture, gait, station, and turns were observed as normal.  Data reviewed: I personally reviewed the images and agree with the radiology interpretations.  Ct  Head (brain) Wo Contrast 05/22/2014 Prominent dural calcification along the falx and tentorium again demonstrated. 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 No acute intracranial abnormalities. No significant change since previous study.   MRI HEAD  05/23/2014 Diffuse dural calcifications/dural thickening. Parietal-occipital FLAIR abnormality greater on the right suspicious for posterior reversible encephalopathy syndrome. Other potential causes for this appearance include that secondary to; artifact, meningitis, subarachnoid hemorrhage or result of external supplemental oxygen. Adequate flow within the superior sagittal sinus cannot be confirmed. This appearance may be chronic and related to the adjacent dural thickening however, sagittal sinus thrombosis could contribute to the parietal occipital FLAIR abnormality. Suggestion of tiny acute posterior left corona radiata infarct. Subtle restricted motion medial aspect of the right parietal -occipital lobe may represent result of posterior reversible encephalopathy syndrome or small infarct. Abnormal vessel extends from the left internal cerebral vein level along the left ambient cistern to the superior margin of the left internal auditory canal. Question small dural fistula. Opacification left petrous apex. Abnormal bone marrow may represent anemia of renal disease. 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 Left parotid 1.4 x 1 centimeter nonspecific lesion incompletely assessed on present exam.   MRA HEAD  05/23/2014 Exam is motion degraded. Mild narrowing and irregularity right internal carotid artery cavernous segment without high-grade stenosis. Prominent left posterior communicating artery origin infundibulum without discrete aneurysm. Hypoplastic A1 segment right anterior cerebral artery. Azygous configuration A2 segment anterior cerebral artery. Middle cerebral artery mild to moderate branch vessel narrowing and irregularity. Non visualized left  posterior inferior cerebellar artery. Mild narrowing distal right vertebral artery. Nonvisualized right anterior inferior cerebellar artery. Posterior cerebral artery branch vessel irregularity. Irregularity and narrowing superior cerebellar artery more notable on the right.   2D echo - - Left ventricle: The cavity size was normal. Systolic function was normal. The estimated ejection fraction was in the range of 55% to 60%. Wall motion was normal; there were no regional wall motion abnormalities. Doppler parameters are consistent with abnormal left ventricular relaxation (grade 1 diastolic dysfunction). - Pericardium, extracardiac: A trivial pericardial effusion was identified.  CUS - Bilateral: 1-39% ICA stenosis. Vertebral artery flow is antegrade.  MRI head 09/16/14 - Abnormal MRI scan of the brain showing diffuse dural thickening with abnormal appearance of bilateral parieto-occipital cortex on T2/FLAIR images the finding of unclear significance with a differential discussed above. Stable incidental findings of Tornwaldt's cysts, opacification of the petrous apex and abnormal vessel in the ambient cistern.  MRA head 09/23/14 - Equivocal MRA head (without) demonstrating: 1. Small (73mm) posterior outpouching of the left left supra-clinoid internal carotid artery, may represent infundibular dilation  at origin of left posterior communicating artery vs aneurysmal dilation. 2. No focal stenoses or occlusions of the medium to large sized vessels.   MRV 09/16/14 - Highly abnormal appearance of superior sagittal sinus with diminutive caliber likely related to patient's chronic dural thickening. Abnormal appearance of deep cerebral venous system likely related to degenerative caliber from dural thickening versus partial thrombosis. Correlate with catheter angiogram if clinically necessary  CT head 02/05/15 1. No acute intracranial abnormality. 2. Stable appearance of the head with  prominence dural calcifications.  MRV 05/19/15 1. Decreased flow signal in the superior sagittal sinus anteriorly, left transverse sinus near the midline, deep cerebral veins (great vein of Galen and straight sinus) and other areas as mentioned above.  2. Prominent cortical cerebral veins noted throughout. 3. These findings may be related to chronic dural calcification / thickening vs chronic thrombosis with partial recannalization. 4. Compared to MRV from 09/16/14, after accounting for differences in MRI field strengths, there is no significant change.   MRI brain 05/19/15 diffuse calcification and thickening of the tentorium with mild diffuse thickening of the meninges throughout. Abnormal appearance of and thickening of the right parieto-occipital cortex of unclear etiology. Abnormal blood vessel coursing through the ambient cistern to the left internal auditory canal. Overall no significant change compared with previous MRI scan dated 09/16/14  MRI and MRA 12/02/15 1.  No acute intracranial abnormality. 2. Mild chronic small vessel disease appears stable since March. 2016 right parietal signal abnormality which was felt related to PRES has resolved without encephalomalacia. 3. 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 This appears stable since the MRV on 05/17/2015. 4. Stable since 2016 and largely unremarkable intracranial MRA. ICA siphon atherosclerosis without stenosis. Small right cavernous ICA segment pseudo-lesion versus infundibulum.  CUS 12/03/15 - Bilateral: intimal wall thickening CCA. Mild soft plaque origin ICA. 1-39% ICA plaquing. Vertebral artery flow is antegrade.  Component     Latest Ref Rng 05/23/2014 07/14/2014  Cholesterol     0 - 200 mg/dL 154   Triglycerides     <150 mg/dL 50   HDL Cholesterol     >39 mg/dL 53   Total CHOL/HDL Ratio      2.9   VLDL     0 - 40 mg/dL 10   LDL (calc)     0 - 99 mg/dL 91     Hemoglobin A1C     4.8 - 5.6 % 5.4   Mean Plasma Glucose      108   HIV Screen 4th Generation wRfx     Non Reactive  Non Reactive  Vitamin B-12     180 - 914 pg/mL  1227 (H)     Component     Latest Ref Rng & Units 10/02/2015  Cholesterol     0 - 200 mg/dL 129  Triglycerides     <150 mg/dL 64  HDL Cholesterol     >40 mg/dL 44  Total CHOL/HDL Ratio     RATIO 2.9  VLDL     0 - 40 mg/dL 13  LDL (calc)     0 - 99 mg/dL 72  Hemoglobin A1C     4.8 - 5.6 % 4.8  Mean Plasma Glucose     mg/dL 91    Assessment: As you may recall, he is a 57 y.o. African American male with PMH of HTN, ESRD on HD was admitted for acute onset of dizziness on 05/22/14. MRI showed  possible PRES at right occipital region, and a left tiny corona radiata acute DWI restriction, suspicious for infarct. Also, right occipital pole has tiny DWI changes which may related to PRES. Stroke work up including TTE and CUS unremarkable. LDL 91. His dizziness resolved and he was discharged with ASA 325 and lipitor. To document the resolution of PRES and to rule out CVT as there is some concern about cerebral veins in MRI, we repeated MRI, MRA and MRV and found that pt continued to have T2 hyperintensity bilateral parieto-occipital coretex and also has significant dural thickening with abnormal appearance of deep cerebral venous system with degenerative caliber, which may be able to explain the possible edema at bilateral parietooccipital region. 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 Not sure if there is any association with ESRD on HD. However, pt had no sign of chronic infection, no hydrocephalus, no complaint of HA or vision changes. Will continue to monitor and repeat MRI annually.   Had admission in 08/2015 and 10/2015 for LGIB, found to have colon ulcer and diverticulitis, treated with antibiotics, underwent colectomy with anastomosis and biopsy showed no  malignancy. Also 09/2015 admission for atypical CP, EKG showed ST elevation, and cardiac enzyme and TTE negative, question for uremic pericarditis, put on low dose colchicine. 06/2016 underwent parathyroidectomy for secondary hyperparathyroidism.   11/2015 had seizure like activity with RUE jerking, weakness and numbness, resolved before reaching ED. MRI and MRA no acute abnormality. CUS and EEG normal. Put on keppra 750mg . However, since then, he started to have worsening headache. Temporal relationship consistent with Keppra use, and as per "up to date", headache is a common side effect of Keppra, ranging from 14-19%. Pt seizure like activity no LOC, RUE brief jerking, EEG normal, no seizure in the past, so keppra was stopped and his HA subsequently resolved.   02/20/17 HA increasing in frequency, HA description more consistent with migraine HA. 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 Fundus not able to see on the left but right no papilledema but indistinct margin. Not able to use diamox due to ESRD on HD. Will start topamax which is safe to use with renal failure and HD per [employer]. Make appointment with Dr. Katy Fitch for further fundi exam.   Plan:  - will repeat MRI and MRV to follow up with hypertrophic pachymeningitis and to rule put CVT - start topamax 25mg  BID x7 days then increase to 50mg  BID after. At HD day, take topamax morning dose after HD. - schedule appt with Dr. Katy Fitch opthomologist - continue ASA and lipitor for stroke prevention - seizure precautions. - Follow up with your primary care physician for stroke risk factor modification. Recommend maintain blood pressure goal <130/80, diabetes with hemoglobin A1c goal below 6.5% and lipids with LDL cholesterol goal below 70 mg/dL.  - follow up with nephrology for ESRD on HD - follow up with cardiology and GI regularly - check BP at  home - follow up in 2 months  I spent more than 25 minutes of face to face time with the patient. Greater than 50% of time was spent in counseling and coordination of care. We discussed further testing for HA, start topamax and follow up with Dr. Katy Fitch for eye exam.    Orders Placed This Encounter  Procedures  . MR BRAIN WO CONTRAST    Standing Status:   Future  Standing Expiration Date:   04/21/2018    Order Specific Question:   What is the patient's sedation requirement?    Answer:   No Sedation    Order Specific Question:   Does the patient have a pacemaker or implanted devices?    Answer:   No    Order Specific Question:   Preferred imaging location?    Answer:   [facility] (table limit-550lbs)    Order Specific Question:   Radiology Contrast Protocol - do NOT remove file path    Answer:   [email or web address]  . MR MRV HEAD WO CM    Standing Status:   Future    Standing Expiration Date:   04/22/2018    Order Specific Question:   What is the patient's sedation requirement?    Answer:   No Sedation    Order Specific Question:   Does the patient have a pacemaker or implanted devices?    Answer:   No    Order Specific Question:   Preferred imaging location?    Answer:   [facility]    Order Specific Question:   Radiology Contrast Protocol - do NOT remove file path    Answer:   [email or web address]  . Ambulatory referral to Ophthalmology    Referral Priority:   Urgent    Referral Type:   Consultation    Referral Reason:   Specialty Services Required    Referred to Provider:   Warden Fillers, MD    Requested Specialty:   Ophthalmology    Number of Visits Requested:   1    Meds ordered this encounter  Medications  . topiramate (TOPAMAX) 25 MG tablet    Sig: 25mg  bid for 7 days and then 50mg  bid after. If on HD day, take the morning dose after dialysis.    Dispense:  120 tablet    Refill:  3    Patient  Instructions  -will repeat MRI and MRV -will start topamax 25mg  twice a day for 7 days then increase to 50mg  twice a day after for headaches -take AM dose of topamax after dialysis on Mon/Wed/Fri -schedule appointment with Dr. Katy Fitch to look at eyes -follow up in 2 months with me   Rosalin Hawking, MD PhD [facility] [address] [place], [place] [place] [employer] [address] [phone]

## 2017-02-21 DIAGNOSIS — G039 Meningitis, unspecified: Secondary | ICD-10-CM | POA: Insufficient documentation

## 2017-02-21 NOTE — Progress Notes (Signed)
Eye report from [place] on Dr. Erlinda Hong desk.

## 2017-02-22 DIAGNOSIS — N2581 Secondary hyperparathyroidism of renal origin: Secondary | ICD-10-CM | POA: Diagnosis not present

## 2017-02-22 DIAGNOSIS — Z23 Encounter for immunization: Secondary | ICD-10-CM | POA: Diagnosis not present

## 2017-02-22 DIAGNOSIS — E875 Hyperkalemia: Secondary | ICD-10-CM | POA: Diagnosis not present

## 2017-02-22 DIAGNOSIS — N186 End stage renal disease: Secondary | ICD-10-CM | POA: Diagnosis not present

## 2017-02-22 DIAGNOSIS — E1129 Type 2 diabetes mellitus with other diabetic kidney complication: Secondary | ICD-10-CM | POA: Diagnosis not present

## 2017-02-22 DIAGNOSIS — D509 Iron deficiency anemia, unspecified: Secondary | ICD-10-CM | POA: Diagnosis not present

## 2017-02-25 DIAGNOSIS — Z23 Encounter for immunization: Secondary | ICD-10-CM | POA: Diagnosis not present

## 2017-02-25 DIAGNOSIS — N2581 Secondary hyperparathyroidism of renal origin: Secondary | ICD-10-CM | POA: Diagnosis not present

## 2017-02-25 DIAGNOSIS — N186 End stage renal disease: Secondary | ICD-10-CM | POA: Diagnosis not present

## 2017-02-25 DIAGNOSIS — D509 Iron deficiency anemia, unspecified: Secondary | ICD-10-CM | POA: Diagnosis not present

## 2017-02-25 DIAGNOSIS — E875 Hyperkalemia: Secondary | ICD-10-CM | POA: Diagnosis not present

## 2017-02-25 DIAGNOSIS — E1129 Type 2 diabetes mellitus with other diabetic kidney complication: Secondary | ICD-10-CM | POA: Diagnosis not present

## 2017-02-27 DIAGNOSIS — E1129 Type 2 diabetes mellitus with other diabetic kidney complication: Secondary | ICD-10-CM | POA: Diagnosis not present

## 2017-02-27 DIAGNOSIS — E875 Hyperkalemia: Secondary | ICD-10-CM | POA: Diagnosis not present

## 2017-02-27 DIAGNOSIS — Z23 Encounter for immunization: Secondary | ICD-10-CM | POA: Diagnosis not present

## 2017-02-27 DIAGNOSIS — N186 End stage renal disease: Secondary | ICD-10-CM | POA: Diagnosis not present

## 2017-02-27 DIAGNOSIS — D509 Iron deficiency anemia, unspecified: Secondary | ICD-10-CM | POA: Diagnosis not present

## 2017-02-27 DIAGNOSIS — N2581 Secondary hyperparathyroidism of renal origin: Secondary | ICD-10-CM | POA: Diagnosis not present

## 2017-03-01 DIAGNOSIS — E1129 Type 2 diabetes mellitus with other diabetic kidney complication: Secondary | ICD-10-CM | POA: Diagnosis not present

## 2017-03-01 DIAGNOSIS — N2581 Secondary hyperparathyroidism of renal origin: Secondary | ICD-10-CM | POA: Diagnosis not present

## 2017-03-01 DIAGNOSIS — E875 Hyperkalemia: Secondary | ICD-10-CM | POA: Diagnosis not present

## 2017-03-01 DIAGNOSIS — Z23 Encounter for immunization: Secondary | ICD-10-CM | POA: Diagnosis not present

## 2017-03-01 DIAGNOSIS — N186 End stage renal disease: Secondary | ICD-10-CM | POA: Diagnosis not present

## 2017-03-01 DIAGNOSIS — D509 Iron deficiency anemia, unspecified: Secondary | ICD-10-CM | POA: Diagnosis not present

## 2017-03-02 ENCOUNTER — Ambulatory Visit
Admission: RE | Admit: 2017-03-02 | Discharge: 2017-03-02 | Disposition: A | Discharge status: Home / Self Care | Payer: Medicare Other | Source: Ambulatory Visit | Attending: Neurology | Admitting: Neurology

## 2017-03-02 DIAGNOSIS — G08 Intracranial and intraspinal phlebitis and thrombophlebitis: Secondary | ICD-10-CM

## 2017-03-02 DIAGNOSIS — G4489 Other headache syndrome: Secondary | ICD-10-CM

## 2017-03-02 DIAGNOSIS — R51 Headache: Secondary | ICD-10-CM | POA: Diagnosis not present

## 2017-03-04 DIAGNOSIS — E1129 Type 2 diabetes mellitus with other diabetic kidney complication: Secondary | ICD-10-CM | POA: Diagnosis not present

## 2017-03-04 DIAGNOSIS — E875 Hyperkalemia: Secondary | ICD-10-CM | POA: Diagnosis not present

## 2017-03-04 DIAGNOSIS — N2581 Secondary hyperparathyroidism of renal origin: Secondary | ICD-10-CM | POA: Diagnosis not present

## 2017-03-04 DIAGNOSIS — D509 Iron deficiency anemia, unspecified: Secondary | ICD-10-CM | POA: Diagnosis not present

## 2017-03-04 DIAGNOSIS — Z23 Encounter for immunization: Secondary | ICD-10-CM | POA: Diagnosis not present

## 2017-03-04 DIAGNOSIS — N186 End stage renal disease: Secondary | ICD-10-CM | POA: Diagnosis not present

## 2017-03-05 ENCOUNTER — Telehealth: Payer: Self-pay

## 2017-03-05 NOTE — Telephone Encounter (Signed)
Rn call patient that the MRV test was unchanged from 09/2014. No specific action at this time.Continue treatment plan. Pt verbalized understanding.

## 2017-03-05 NOTE — Telephone Encounter (Signed)
-----   Message from Rosalin Hawking, MD sent at 03/04/2017  7:02 AM EST ----- Could you please let the patient know that the MRI brain test done recently in our office was unchanged from 09/2014. No specific action needed at this time. Please continue current treatment. Thanks.  Rosalin Hawking, MD PhD Stroke Neurology 03/04/2017 7:02 AM

## 2017-03-06 DIAGNOSIS — N2581 Secondary hyperparathyroidism of renal origin: Secondary | ICD-10-CM | POA: Diagnosis not present

## 2017-03-06 DIAGNOSIS — E875 Hyperkalemia: Secondary | ICD-10-CM | POA: Diagnosis not present

## 2017-03-06 DIAGNOSIS — N186 End stage renal disease: Secondary | ICD-10-CM | POA: Diagnosis not present

## 2017-03-06 DIAGNOSIS — D509 Iron deficiency anemia, unspecified: Secondary | ICD-10-CM | POA: Diagnosis not present

## 2017-03-06 DIAGNOSIS — E1129 Type 2 diabetes mellitus with other diabetic kidney complication: Secondary | ICD-10-CM | POA: Diagnosis not present

## 2017-03-06 DIAGNOSIS — Z23 Encounter for immunization: Secondary | ICD-10-CM | POA: Diagnosis not present

## 2017-03-08 DIAGNOSIS — N186 End stage renal disease: Secondary | ICD-10-CM | POA: Diagnosis not present

## 2017-03-08 DIAGNOSIS — Z23 Encounter for immunization: Secondary | ICD-10-CM | POA: Diagnosis not present

## 2017-03-08 DIAGNOSIS — D509 Iron deficiency anemia, unspecified: Secondary | ICD-10-CM | POA: Diagnosis not present

## 2017-03-08 DIAGNOSIS — E875 Hyperkalemia: Secondary | ICD-10-CM | POA: Diagnosis not present

## 2017-03-08 DIAGNOSIS — E1129 Type 2 diabetes mellitus with other diabetic kidney complication: Secondary | ICD-10-CM | POA: Diagnosis not present

## 2017-03-08 DIAGNOSIS — N2581 Secondary hyperparathyroidism of renal origin: Secondary | ICD-10-CM | POA: Diagnosis not present

## 2017-03-11 DIAGNOSIS — E875 Hyperkalemia: Secondary | ICD-10-CM | POA: Diagnosis not present

## 2017-03-11 DIAGNOSIS — D509 Iron deficiency anemia, unspecified: Secondary | ICD-10-CM | POA: Diagnosis not present

## 2017-03-11 DIAGNOSIS — Z23 Encounter for immunization: Secondary | ICD-10-CM | POA: Diagnosis not present

## 2017-03-11 DIAGNOSIS — E1129 Type 2 diabetes mellitus with other diabetic kidney complication: Secondary | ICD-10-CM | POA: Diagnosis not present

## 2017-03-11 DIAGNOSIS — N2581 Secondary hyperparathyroidism of renal origin: Secondary | ICD-10-CM | POA: Diagnosis not present

## 2017-03-11 DIAGNOSIS — N186 End stage renal disease: Secondary | ICD-10-CM | POA: Diagnosis not present

## 2017-03-13 DIAGNOSIS — E875 Hyperkalemia: Secondary | ICD-10-CM | POA: Diagnosis not present

## 2017-03-13 DIAGNOSIS — D509 Iron deficiency anemia, unspecified: Secondary | ICD-10-CM | POA: Diagnosis not present

## 2017-03-13 DIAGNOSIS — E1129 Type 2 diabetes mellitus with other diabetic kidney complication: Secondary | ICD-10-CM | POA: Diagnosis not present

## 2017-03-13 DIAGNOSIS — N186 End stage renal disease: Secondary | ICD-10-CM | POA: Diagnosis not present

## 2017-03-13 DIAGNOSIS — N2581 Secondary hyperparathyroidism of renal origin: Secondary | ICD-10-CM | POA: Diagnosis not present

## 2017-03-13 DIAGNOSIS — Z23 Encounter for immunization: Secondary | ICD-10-CM | POA: Diagnosis not present

## 2017-03-14 DIAGNOSIS — Z992 Dependence on renal dialysis: Secondary | ICD-10-CM | POA: Diagnosis not present

## 2017-03-14 DIAGNOSIS — I12 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 stage renal disease: Secondary | ICD-10-CM | POA: Diagnosis not present

## 2017-03-14 DIAGNOSIS — N186 End stage renal disease: Secondary | ICD-10-CM | POA: Diagnosis not present

## 2017-03-15 DIAGNOSIS — I12 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 stage renal disease: Secondary | ICD-10-CM | POA: Diagnosis not present

## 2017-03-15 DIAGNOSIS — E1129 Type 2 diabetes mellitus with other diabetic kidney complication: Secondary | ICD-10-CM | POA: Diagnosis not present

## 2017-03-15 DIAGNOSIS — E875 Hyperkalemia: Secondary | ICD-10-CM | POA: Diagnosis not present

## 2017-03-15 DIAGNOSIS — D631 Anemia in chronic kidney disease: Secondary | ICD-10-CM | POA: Diagnosis not present

## 2017-03-15 DIAGNOSIS — Z992 Dependence on renal dialysis: Secondary | ICD-10-CM | POA: Diagnosis not present

## 2017-03-15 DIAGNOSIS — D509 Iron deficiency anemia, unspecified: Secondary | ICD-10-CM | POA: Diagnosis not present

## 2017-03-15 DIAGNOSIS — N2581 Secondary hyperparathyroidism of renal origin: Secondary | ICD-10-CM | POA: Diagnosis not present

## 2017-03-15 DIAGNOSIS — N186 End stage renal disease: Secondary | ICD-10-CM | POA: Diagnosis not present

## 2017-03-18 DIAGNOSIS — E1129 Type 2 diabetes mellitus with other diabetic kidney complication: Secondary | ICD-10-CM | POA: Diagnosis not present

## 2017-03-18 DIAGNOSIS — N2581 Secondary hyperparathyroidism of renal origin: Secondary | ICD-10-CM | POA: Diagnosis not present

## 2017-03-18 DIAGNOSIS — D631 Anemia in chronic kidney disease: Secondary | ICD-10-CM | POA: Diagnosis not present

## 2017-03-18 DIAGNOSIS — N186 End stage renal disease: Secondary | ICD-10-CM | POA: Diagnosis not present

## 2017-03-18 DIAGNOSIS — D509 Iron deficiency anemia, unspecified: Secondary | ICD-10-CM | POA: Diagnosis not present

## 2017-03-18 DIAGNOSIS — E875 Hyperkalemia: Secondary | ICD-10-CM | POA: Diagnosis not present

## 2017-03-20 DIAGNOSIS — D509 Iron deficiency anemia, unspecified: Secondary | ICD-10-CM | POA: Diagnosis not present

## 2017-03-20 DIAGNOSIS — N186 End stage renal disease: Secondary | ICD-10-CM | POA: Diagnosis not present

## 2017-03-20 DIAGNOSIS — D631 Anemia in chronic kidney disease: Secondary | ICD-10-CM | POA: Diagnosis not present

## 2017-03-20 DIAGNOSIS — E875 Hyperkalemia: Secondary | ICD-10-CM | POA: Diagnosis not present

## 2017-03-20 DIAGNOSIS — N2581 Secondary hyperparathyroidism of renal origin: Secondary | ICD-10-CM | POA: Diagnosis not present

## 2017-03-20 DIAGNOSIS — E1129 Type 2 diabetes mellitus with other diabetic kidney complication: Secondary | ICD-10-CM | POA: Diagnosis not present

## 2017-03-22 DIAGNOSIS — E1129 Type 2 diabetes mellitus with other diabetic kidney complication: Secondary | ICD-10-CM | POA: Diagnosis not present

## 2017-03-22 DIAGNOSIS — N186 End stage renal disease: Secondary | ICD-10-CM | POA: Diagnosis not present

## 2017-03-22 DIAGNOSIS — D631 Anemia in chronic kidney disease: Secondary | ICD-10-CM | POA: Diagnosis not present

## 2017-03-22 DIAGNOSIS — N2581 Secondary hyperparathyroidism of renal origin: Secondary | ICD-10-CM | POA: Diagnosis not present

## 2017-03-22 DIAGNOSIS — E875 Hyperkalemia: Secondary | ICD-10-CM | POA: Diagnosis not present

## 2017-03-22 DIAGNOSIS — D509 Iron deficiency anemia, unspecified: Secondary | ICD-10-CM | POA: Diagnosis not present

## 2017-03-25 DIAGNOSIS — E1129 Type 2 diabetes mellitus with other diabetic kidney complication: Secondary | ICD-10-CM | POA: Diagnosis not present

## 2017-03-25 DIAGNOSIS — D631 Anemia in chronic kidney disease: Secondary | ICD-10-CM | POA: Diagnosis not present

## 2017-03-25 DIAGNOSIS — N2581 Secondary hyperparathyroidism of renal origin: Secondary | ICD-10-CM | POA: Diagnosis not present

## 2017-03-25 DIAGNOSIS — N186 End stage renal disease: Secondary | ICD-10-CM | POA: Diagnosis not present

## 2017-03-25 DIAGNOSIS — E875 Hyperkalemia: Secondary | ICD-10-CM | POA: Diagnosis not present

## 2017-03-25 DIAGNOSIS — D509 Iron deficiency anemia, unspecified: Secondary | ICD-10-CM | POA: Diagnosis not present

## 2017-03-27 DIAGNOSIS — N186 End stage renal disease: Secondary | ICD-10-CM | POA: Diagnosis not present

## 2017-03-27 DIAGNOSIS — N2581 Secondary hyperparathyroidism of renal origin: Secondary | ICD-10-CM | POA: Diagnosis not present

## 2017-03-27 DIAGNOSIS — D631 Anemia in chronic kidney disease: Secondary | ICD-10-CM | POA: Diagnosis not present

## 2017-03-27 DIAGNOSIS — E875 Hyperkalemia: Secondary | ICD-10-CM | POA: Diagnosis not present

## 2017-03-27 DIAGNOSIS — E1129 Type 2 diabetes mellitus with other diabetic kidney complication: Secondary | ICD-10-CM | POA: Diagnosis not present

## 2017-03-27 DIAGNOSIS — D509 Iron deficiency anemia, unspecified: Secondary | ICD-10-CM | POA: Diagnosis not present

## 2017-03-29 DIAGNOSIS — E875 Hyperkalemia: Secondary | ICD-10-CM | POA: Diagnosis not present

## 2017-03-29 DIAGNOSIS — D631 Anemia in chronic kidney disease: Secondary | ICD-10-CM | POA: Diagnosis not present

## 2017-03-29 DIAGNOSIS — D509 Iron deficiency anemia, unspecified: Secondary | ICD-10-CM | POA: Diagnosis not present

## 2017-03-29 DIAGNOSIS — E1129 Type 2 diabetes mellitus with other diabetic kidney complication: Secondary | ICD-10-CM | POA: Diagnosis not present

## 2017-03-29 DIAGNOSIS — N2581 Secondary hyperparathyroidism of renal origin: Secondary | ICD-10-CM | POA: Diagnosis not present

## 2017-03-29 DIAGNOSIS — N186 End stage renal disease: Secondary | ICD-10-CM | POA: Diagnosis not present

## 2017-04-01 DIAGNOSIS — D631 Anemia in chronic kidney disease: Secondary | ICD-10-CM | POA: Diagnosis not present

## 2017-04-01 DIAGNOSIS — N186 End stage renal disease: Secondary | ICD-10-CM | POA: Diagnosis not present

## 2017-04-01 DIAGNOSIS — N2581 Secondary hyperparathyroidism of renal origin: Secondary | ICD-10-CM | POA: Diagnosis not present

## 2017-04-01 DIAGNOSIS — E1129 Type 2 diabetes mellitus with other diabetic kidney complication: Secondary | ICD-10-CM | POA: Diagnosis not present

## 2017-04-01 DIAGNOSIS — D509 Iron deficiency anemia, unspecified: Secondary | ICD-10-CM | POA: Diagnosis not present

## 2017-04-01 DIAGNOSIS — E875 Hyperkalemia: Secondary | ICD-10-CM | POA: Diagnosis not present

## 2017-04-03 DIAGNOSIS — D509 Iron deficiency anemia, unspecified: Secondary | ICD-10-CM | POA: Diagnosis not present

## 2017-04-03 DIAGNOSIS — E875 Hyperkalemia: Secondary | ICD-10-CM | POA: Diagnosis not present

## 2017-04-03 DIAGNOSIS — N2581 Secondary hyperparathyroidism of renal origin: Secondary | ICD-10-CM | POA: Diagnosis not present

## 2017-04-03 DIAGNOSIS — E1129 Type 2 diabetes mellitus with other diabetic kidney complication: Secondary | ICD-10-CM | POA: Diagnosis not present

## 2017-04-03 DIAGNOSIS — N186 End stage renal disease: Secondary | ICD-10-CM | POA: Diagnosis not present

## 2017-04-03 DIAGNOSIS — D631 Anemia in chronic kidney disease: Secondary | ICD-10-CM | POA: Diagnosis not present

## 2017-04-05 DIAGNOSIS — D631 Anemia in chronic kidney disease: Secondary | ICD-10-CM | POA: Diagnosis not present

## 2017-04-05 DIAGNOSIS — E1129 Type 2 diabetes mellitus with other diabetic kidney complication: Secondary | ICD-10-CM | POA: Diagnosis not present

## 2017-04-05 DIAGNOSIS — N2581 Secondary hyperparathyroidism of renal origin: Secondary | ICD-10-CM | POA: Diagnosis not present

## 2017-04-05 DIAGNOSIS — E875 Hyperkalemia: Secondary | ICD-10-CM | POA: Diagnosis not present

## 2017-04-05 DIAGNOSIS — N186 End stage renal disease: Secondary | ICD-10-CM | POA: Diagnosis not present

## 2017-04-05 DIAGNOSIS — D509 Iron deficiency anemia, unspecified: Secondary | ICD-10-CM | POA: Diagnosis not present

## 2017-04-08 DIAGNOSIS — D509 Iron deficiency anemia, unspecified: Secondary | ICD-10-CM | POA: Diagnosis not present

## 2017-04-08 DIAGNOSIS — D631 Anemia in chronic kidney disease: Secondary | ICD-10-CM | POA: Diagnosis not present

## 2017-04-08 DIAGNOSIS — E875 Hyperkalemia: Secondary | ICD-10-CM | POA: Diagnosis not present

## 2017-04-08 DIAGNOSIS — N186 End stage renal disease: Secondary | ICD-10-CM | POA: Diagnosis not present

## 2017-04-08 DIAGNOSIS — N2581 Secondary hyperparathyroidism of renal origin: Secondary | ICD-10-CM | POA: Diagnosis not present

## 2017-04-08 DIAGNOSIS — E1129 Type 2 diabetes mellitus with other diabetic kidney complication: Secondary | ICD-10-CM | POA: Diagnosis not present

## 2017-04-10 DIAGNOSIS — E875 Hyperkalemia: Secondary | ICD-10-CM | POA: Diagnosis not present

## 2017-04-10 DIAGNOSIS — D631 Anemia in chronic kidney disease: Secondary | ICD-10-CM | POA: Diagnosis not present

## 2017-04-10 DIAGNOSIS — L409 Psoriasis, unspecified: Secondary | ICD-10-CM | POA: Diagnosis not present

## 2017-04-10 DIAGNOSIS — D509 Iron deficiency anemia, unspecified: Secondary | ICD-10-CM | POA: Diagnosis not present

## 2017-04-10 DIAGNOSIS — E1129 Type 2 diabetes mellitus with other diabetic kidney complication: Secondary | ICD-10-CM | POA: Diagnosis not present

## 2017-04-10 DIAGNOSIS — D485 Neoplasm of uncertain behavior of skin: Secondary | ICD-10-CM | POA: Diagnosis not present

## 2017-04-10 DIAGNOSIS — L280 Lichen simplex chronicus: Secondary | ICD-10-CM | POA: Diagnosis not present

## 2017-04-10 DIAGNOSIS — L720 Epidermal cyst: Secondary | ICD-10-CM | POA: Diagnosis not present

## 2017-04-10 DIAGNOSIS — N186 End stage renal disease: Secondary | ICD-10-CM | POA: Diagnosis not present

## 2017-04-10 DIAGNOSIS — N2581 Secondary hyperparathyroidism of renal origin: Secondary | ICD-10-CM | POA: Diagnosis not present

## 2017-04-12 DIAGNOSIS — N186 End stage renal disease: Secondary | ICD-10-CM | POA: Diagnosis not present

## 2017-04-12 DIAGNOSIS — Z992 Dependence on renal dialysis: Secondary | ICD-10-CM | POA: Diagnosis not present

## 2017-04-12 DIAGNOSIS — E875 Hyperkalemia: Secondary | ICD-10-CM | POA: Diagnosis not present

## 2017-04-12 DIAGNOSIS — D509 Iron deficiency anemia, unspecified: Secondary | ICD-10-CM | POA: Diagnosis not present

## 2017-04-12 DIAGNOSIS — I12 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 stage renal disease: Secondary | ICD-10-CM | POA: Diagnosis not present

## 2017-04-12 DIAGNOSIS — D631 Anemia in chronic kidney disease: Secondary | ICD-10-CM | POA: Diagnosis not present

## 2017-04-12 DIAGNOSIS — E1129 Type 2 diabetes mellitus with other diabetic kidney complication: Secondary | ICD-10-CM | POA: Diagnosis not present

## 2017-04-12 DIAGNOSIS — N2581 Secondary hyperparathyroidism of renal origin: Secondary | ICD-10-CM | POA: Diagnosis not present

## 2017-04-15 DIAGNOSIS — D509 Iron deficiency anemia, unspecified: Secondary | ICD-10-CM | POA: Diagnosis not present

## 2017-04-15 DIAGNOSIS — D631 Anemia in chronic kidney disease: Secondary | ICD-10-CM | POA: Diagnosis not present

## 2017-04-15 DIAGNOSIS — E1129 Type 2 diabetes mellitus with other diabetic kidney complication: Secondary | ICD-10-CM | POA: Diagnosis not present

## 2017-04-15 DIAGNOSIS — E875 Hyperkalemia: Secondary | ICD-10-CM | POA: Diagnosis not present

## 2017-04-15 DIAGNOSIS — N186 End stage renal disease: Secondary | ICD-10-CM | POA: Diagnosis not present

## 2017-04-15 DIAGNOSIS — N2581 Secondary hyperparathyroidism of renal origin: Secondary | ICD-10-CM | POA: Diagnosis not present

## 2017-04-17 DIAGNOSIS — E1129 Type 2 diabetes mellitus with other diabetic kidney complication: Secondary | ICD-10-CM | POA: Diagnosis not present

## 2017-04-17 DIAGNOSIS — E875 Hyperkalemia: Secondary | ICD-10-CM | POA: Diagnosis not present

## 2017-04-17 DIAGNOSIS — D631 Anemia in chronic kidney disease: Secondary | ICD-10-CM | POA: Diagnosis not present

## 2017-04-17 DIAGNOSIS — N2581 Secondary hyperparathyroidism of renal origin: Secondary | ICD-10-CM | POA: Diagnosis not present

## 2017-04-17 DIAGNOSIS — N186 End stage renal disease: Secondary | ICD-10-CM | POA: Diagnosis not present

## 2017-04-17 DIAGNOSIS — D509 Iron deficiency anemia, unspecified: Secondary | ICD-10-CM | POA: Diagnosis not present

## 2017-04-18 DIAGNOSIS — Z01818 Encounter for other preprocedural examination: Secondary | ICD-10-CM | POA: Diagnosis not present

## 2017-04-18 DIAGNOSIS — D631 Anemia in chronic kidney disease: Secondary | ICD-10-CM | POA: Diagnosis not present

## 2017-04-18 DIAGNOSIS — G4733 Obstructive sleep apnea (adult) (pediatric): Secondary | ICD-10-CM | POA: Diagnosis not present

## 2017-04-18 DIAGNOSIS — Z87891 Personal history of nicotine dependence: Secondary | ICD-10-CM | POA: Diagnosis not present

## 2017-04-18 DIAGNOSIS — N186 End stage renal disease: Secondary | ICD-10-CM | POA: Diagnosis not present

## 2017-04-18 DIAGNOSIS — N2889 Other specified disorders of kidney and ureter: Secondary | ICD-10-CM | POA: Diagnosis not present

## 2017-04-18 DIAGNOSIS — Z01810 Encounter for preprocedural cardiovascular examination: Secondary | ICD-10-CM | POA: Diagnosis not present

## 2017-04-18 DIAGNOSIS — I12 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 stage renal disease: Secondary | ICD-10-CM | POA: Diagnosis not present

## 2017-04-18 DIAGNOSIS — Z992 Dependence on renal dialysis: Secondary | ICD-10-CM | POA: Diagnosis not present

## 2017-04-18 DIAGNOSIS — E213 Hyperparathyroidism, unspecified: Secondary | ICD-10-CM | POA: Diagnosis not present

## 2017-04-18 DIAGNOSIS — K5792 Diverticulitis of intestine, part unspecified, without perforation or abscess without bleeding: Secondary | ICD-10-CM | POA: Diagnosis not present

## 2017-04-18 DIAGNOSIS — Z9119 Patient's noncompliance with other medical treatment and regimen: Secondary | ICD-10-CM | POA: Diagnosis not present

## 2017-04-19 DIAGNOSIS — N186 End stage renal disease: Secondary | ICD-10-CM | POA: Diagnosis not present

## 2017-04-19 DIAGNOSIS — E875 Hyperkalemia: Secondary | ICD-10-CM | POA: Diagnosis not present

## 2017-04-19 DIAGNOSIS — N2581 Secondary hyperparathyroidism of renal origin: Secondary | ICD-10-CM | POA: Diagnosis not present

## 2017-04-19 DIAGNOSIS — D509 Iron deficiency anemia, unspecified: Secondary | ICD-10-CM | POA: Diagnosis not present

## 2017-04-19 DIAGNOSIS — E1129 Type 2 diabetes mellitus with other diabetic kidney complication: Secondary | ICD-10-CM | POA: Diagnosis not present

## 2017-04-19 DIAGNOSIS — D631 Anemia in chronic kidney disease: Secondary | ICD-10-CM | POA: Diagnosis not present

## 2017-04-22 DIAGNOSIS — N2581 Secondary hyperparathyroidism of renal origin: Secondary | ICD-10-CM | POA: Diagnosis not present

## 2017-04-22 DIAGNOSIS — D631 Anemia in chronic kidney disease: Secondary | ICD-10-CM | POA: Diagnosis not present

## 2017-04-22 DIAGNOSIS — E1129 Type 2 diabetes mellitus with other diabetic kidney complication: Secondary | ICD-10-CM | POA: Diagnosis not present

## 2017-04-22 DIAGNOSIS — N186 End stage renal disease: Secondary | ICD-10-CM | POA: Diagnosis not present

## 2017-04-22 DIAGNOSIS — E875 Hyperkalemia: Secondary | ICD-10-CM | POA: Diagnosis not present

## 2017-04-22 DIAGNOSIS — D509 Iron deficiency anemia, unspecified: Secondary | ICD-10-CM | POA: Diagnosis not present

## 2017-04-23 DIAGNOSIS — L720 Epidermal cyst: Secondary | ICD-10-CM | POA: Diagnosis not present

## 2017-04-24 DIAGNOSIS — N186 End stage renal disease: Secondary | ICD-10-CM | POA: Diagnosis not present

## 2017-04-24 DIAGNOSIS — N2581 Secondary hyperparathyroidism of renal origin: Secondary | ICD-10-CM | POA: Diagnosis not present

## 2017-04-24 DIAGNOSIS — E875 Hyperkalemia: Secondary | ICD-10-CM | POA: Diagnosis not present

## 2017-04-24 DIAGNOSIS — D509 Iron deficiency anemia, unspecified: Secondary | ICD-10-CM | POA: Diagnosis not present

## 2017-04-24 DIAGNOSIS — E1129 Type 2 diabetes mellitus with other diabetic kidney complication: Secondary | ICD-10-CM | POA: Diagnosis not present

## 2017-04-24 DIAGNOSIS — D631 Anemia in chronic kidney disease: Secondary | ICD-10-CM | POA: Diagnosis not present

## 2017-04-26 DIAGNOSIS — D631 Anemia in chronic kidney disease: Secondary | ICD-10-CM | POA: Diagnosis not present

## 2017-04-26 DIAGNOSIS — D509 Iron deficiency anemia, unspecified: Secondary | ICD-10-CM | POA: Diagnosis not present

## 2017-04-26 DIAGNOSIS — E875 Hyperkalemia: Secondary | ICD-10-CM | POA: Diagnosis not present

## 2017-04-26 DIAGNOSIS — N2581 Secondary hyperparathyroidism of renal origin: Secondary | ICD-10-CM | POA: Diagnosis not present

## 2017-04-26 DIAGNOSIS — N186 End stage renal disease: Secondary | ICD-10-CM | POA: Diagnosis not present

## 2017-04-26 DIAGNOSIS — E1129 Type 2 diabetes mellitus with other diabetic kidney complication: Secondary | ICD-10-CM | POA: Diagnosis not present

## 2017-04-29 DIAGNOSIS — D509 Iron deficiency anemia, unspecified: Secondary | ICD-10-CM | POA: Diagnosis not present

## 2017-04-29 DIAGNOSIS — E1129 Type 2 diabetes mellitus with other diabetic kidney complication: Secondary | ICD-10-CM | POA: Diagnosis not present

## 2017-04-29 DIAGNOSIS — E875 Hyperkalemia: Secondary | ICD-10-CM | POA: Diagnosis not present

## 2017-04-29 DIAGNOSIS — N186 End stage renal disease: Secondary | ICD-10-CM | POA: Diagnosis not present

## 2017-04-29 DIAGNOSIS — N2581 Secondary hyperparathyroidism of renal origin: Secondary | ICD-10-CM | POA: Diagnosis not present

## 2017-04-29 DIAGNOSIS — D631 Anemia in chronic kidney disease: Secondary | ICD-10-CM | POA: Diagnosis not present

## 2017-04-30 ENCOUNTER — Encounter: Payer: Self-pay | Admitting: Neurology

## 2017-04-30 ENCOUNTER — Ambulatory Visit (INDEPENDENT_AMBULATORY_CARE_PROVIDER_SITE_OTHER): Payer: Medicare Other | Admitting: Neurology

## 2017-04-30 VITALS — BP 125/74 | HR 90 | Wt 200.6 lb

## 2017-04-30 DIAGNOSIS — G039 Meningitis, unspecified: Secondary | ICD-10-CM

## 2017-04-30 DIAGNOSIS — N186 End stage renal disease: Secondary | ICD-10-CM | POA: Diagnosis not present

## 2017-04-30 DIAGNOSIS — R569 Unspecified convulsions: Secondary | ICD-10-CM

## 2017-04-30 DIAGNOSIS — G4489 Other headache syndrome: Secondary | ICD-10-CM | POA: Diagnosis not present

## 2017-04-30 DIAGNOSIS — Z992 Dependence on renal dialysis: Secondary | ICD-10-CM | POA: Diagnosis not present

## 2017-04-30 MED ORDER — TOPIRAMATE 25 MG PO TABS: 25.0000 mg | ORAL_TABLET | Freq: Two times a day (BID) | ORAL | 3 refills | Status: DC

## 2017-04-30 NOTE — Patient Instructions (Addendum)
-   continue topamax 25mg  twice a day for HA prevention. At HD day, take topamax morning dose after HD. - continue ASA and lipitor for stroke prevention - seizure precautions. - Follow up with your primary care physician for stroke risk factor modification. Recommend maintain blood pressure goal <130/80, diabetes with hemoglobin A1c goal below 6.5% and lipids with LDL cholesterol goal below 70 mg/dL.  - follow up with nephrology for ESRD on HD - follow up with cardiology and GI regularly. - check BP at home and record - follow up in 6 months with Janett Billow.

## 2017-04-30 NOTE — Progress Notes (Signed)
STROKE NEUROLOGY FOLLOW UP NOTE  NAME: Carl Bukhari Sr. DOB: September 09, 1960  REASON FOR VISIT: stroke follow up HISTORY FROM: pt and chart  Today we had the pleasure of seeing Carl Baik Sr. in follow-up at our Neurology [facility]. Pt was accompanied by no one.   History Summary Mr. Carl Marciano Sr. is a 57 y.o. male with history of HTN, ESRD on HD was admitted for acute onset of dizziness on 05/22/14. He was helping his brother for car repair and struck his head at the top to the hood. A couple of hours later, he felt very dizzy and not right. 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 Also, right occipital pole has tiny DWI changes which may related to PRES. He was admitted for further evaluation. Stroke work up including MRA, TTE and CUS unremarkable. LDL 91. During hospitalization, his dizziness resolved and he was discharged with ASA 325 and lipitor.   08/16/14 follow up - the patient has been doing well. No recurrent stroke symptoms or seizure. However, he had LGIB in 06/2014 and was admitted. Hb as low as to 5.4. He refused blood transfusion. His ASA was d/c'ed. LGIB gradually stopped and last check Hb was 10.4. Currently, he feels good without any abdominal pain and no bleeding. He is in the waiting list for kidney transplantation. BP today 130/86.  11/02/14 follow up - the patient has been doing well. No recurrent symptoms. His GIB resolved and he is on ASA without problems. BP 116/75 today. No HA or seizure. No vision changes.    05/05/15 follow up - he has been doing well. He went to ED on 02/05/15 for migraine headache. CT brain no acute findings. Improved after pain management in ED. Given fioricet for HA and as per report he felt a lot better with the medication. Otherwise, pt has no complains. Continued on HD 3/week.   10/20/15 follow up - he was admitted in 07/2015 for acute LGIB and  blood loss anemia. Had GI consult and colonoscopy showed colon ulcer and nearly perforated cecal mass. Had colectomy with anastomosis and biopsy showed no malignancy. Treated with IV zosyn for 7 days. His ASA resumed. He went to ED again in 08/2015 for abdominal pain and CT found to have diverticulitis, and he was treated with cipro and flagyl. In 09/2015 he was admitted for atypical chest pain, EKG showed diffuse ST elevations concerning for pericardial effusion. Cardiac enzyme negative. However, TTE did not show pericardial effusion. He was treated with low dose colchicine for ? Uremic pericarditis.   Pt neurologically stable since last visit, BP good or low at home, today in clinic 104/71. Had b/l cataract surgery about 3 months ago as per pt. Still has intermittent HA, same as before, sometimes more severe than others. Stated fioricet helped in the past but no more refills. No fever or vision changes.   12/02/15 admission - he was admitted for sudden onset RUE jerking, numbness and weakness, resolved prior to ED arrival. There was also equivocal right facial twitching at that time. MRI and MRA no acute abnormality, but still chronic dural thickening. EEG normal. LDL 72 and A1c 4.8. Due to concerning for seizure, he was put on Keppra 750 mg.  02/2016 ED visit - Since discharge, patient HA getting more frequent, almost daily, comes and goes, at top of head, punding, 6-7/10. Starting this month, HA more severe than  before, he has to use fioricet and ibuprofen. On 02/16/16, he went to ED due to severe HA at midnight. Was given migraine cocktail and improved before discharge.   02/21/16 follow up - pt has been doing well. No more seizure like activity. Still has HA but mild. Had eye exam 2 weeks ago at Dr. Katy Fitch office, but not remember the result. He is still on keppra 750mg  daily, no addition dose after HD. BP 113/70.   08/21/16 follow up - During the interval time, pt has been doing well. HA is gone after  stopping keppra. He underwent parathyroidectomy in 06/2016. He was also back to kidney transplant list last month. He has no other complains, BP today 105/63.   02/21/16 follow up - Since last appointment, HA has been present every other day. Tylenol extra strength 2 tabs, ibuprofen 800mg , Excedrin 2 tabs  - will take one med and if doesn't work will take another. Cool compression will relieve. Works for at least 4-5 hours then HA returns - same severity prior to taking medication. At times, will wake up without HA but other times will have faint HA. He is unsure if increase in HA is due to stress with kidney issues. Every other day HA has been happening for past few months. N/V present with dry heaving. At times sensitive to lights. Denies auras. Has not noticed if anything makes HA worse. Denies correlation between HA and HD treatments. Pain located in frontal forehead from temrpal to tempral. At times radiates to top of head. Radiates down left side of neck.   Interval Review:  During the interval time, pt has been doing well. On topomax twice a day and no more HA. I have received 11/2016 ophthalmology note from Dr. Katy Fitch office and no papilledema seen at that time. MRI and MRV brain repeat showed stable without changes. Otherwise, he is doing well and will have kidney surgery next month for preparation of kidney transplant. BP 125/74.   REVIEW OF SYSTEMS: Full 14 system review of systems performed and notable only for those listed below and in HPI above, all others are negative:  Constitutional: HA/migraine Cardiovascular:  Ear/Nose/Throat:   Skin:  Eyes:   Respiratory:   Gastroitestinal:   Genitourinary:  Hematology/Lymphatic:   Endocrine:  Musculoskeletal:   Allergy/Immunology:   Neurological:   Psychiatric:  Sleep:   The following represents the patient's updated allergies and side effects list: Allergies  Allergen Reactions  . Infed [Iron Dextran] Other (See Comments)    Decreased BP    . Oxycodone Nausea Only  . Vicodin [Hydrocodone-Acetaminophen] Other (See Comments)    Decreased BP  . Diclofenac Other (See Comments)    Unknown  . Whole Blood Other (See Comments)    Patient refuses whole blood "unless I am going to die"    The neurologically relevant items on the patient's problem list were reviewed on today's visit.  Neurologic Examination  A problem focused neurological exam (12 or more points of the single system neurologic examination, vital signs counts as 1 point, cranial nerves count for 8 points) was performed.  Blood pressure 125/74, pulse 90, weight 200 lb 9.6 oz (91 kg).  General - Well nourished, well developed, in no apparent distress.  Ophthalmologic - difficult to visualize left fundus due to small pupil.    Cardiovascular - Regular rate and rhythm with no murmur.  Mental Status -  Level of arousal and orientation to time, place, and person were intact. Language including expression,  naming, repetition, comprehension was assessed and found intact. Attention span and concentration were normal. Fund of Knowledge was assessed and was intact.  Cranial Nerves II - XII - II - Visual field intact OU. III, IV, VI - Extraocular movements intact. V - Facial sensation intact bilaterally. VII - Facial movement intact bilaterally. VIII - Hearing & vestibular intact bilaterally. X - Palate elevates symmetrically. XI - Chin turning & shoulder shrug intact bilaterally. XII - Tongue protrusion intact.  Motor Strength - The patient's strength was normal in all extremities and pronator drift was absent.  Bulk was normal and fasciculations were absent.   Motor Tone - Muscle tone was assessed at the neck and appendages and was normal.  Reflexes - The patient's reflexes were 1+ in all extremities and he had no pathological reflexes.  Sensory - Light touch, temperature/pinprick were assessed and were normal.    Coordination - The patient had normal  movements in the hands and feet with no ataxia or dysmetria.  Tremor was absent.  Gait and Station - The patient's transfers, posture, gait, station, and turns were observed as normal.  Data reviewed: I personally reviewed the images and agree with the radiology interpretations.  Ct Head (brain) Wo Contrast 05/22/2014 Prominent dural calcification along the falx and tentorium again demonstrated. 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 No acute intracranial abnormalities. No significant change since previous study.   MRI HEAD  05/23/2014 Diffuse dural calcifications/dural thickening. Parietal-occipital FLAIR abnormality greater on the right suspicious for posterior reversible encephalopathy syndrome. Other potential causes for this appearance include that secondary to; artifact, meningitis, subarachnoid hemorrhage or result of external supplemental oxygen. Adequate flow within the superior sagittal sinus cannot be confirmed. This appearance may be chronic and related to the adjacent dural thickening however, sagittal sinus thrombosis could contribute to the parietal occipital FLAIR abnormality. Suggestion of tiny acute posterior left corona radiata infarct. Subtle restricted motion medial aspect of the right parietal -occipital lobe may represent result of posterior reversible encephalopathy syndrome or small infarct. Abnormal vessel extends from the left internal cerebral vein level along the left ambient cistern to the superior margin of the left internal auditory canal. Question small dural fistula. Opacification left petrous apex. Abnormal bone marrow may represent anemia of renal disease. 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 Left parotid 1.4 x 1 centimeter nonspecific lesion incompletely assessed on present exam.   MRA HEAD  05/23/2014 Exam is motion degraded. Mild narrowing and irregularity right internal carotid artery cavernous segment without high-grade stenosis. Prominent left  posterior communicating artery origin infundibulum without discrete aneurysm. Hypoplastic A1 segment right anterior cerebral artery. Azygous configuration A2 segment anterior cerebral artery. Middle cerebral artery mild to moderate branch vessel narrowing and irregularity. Non visualized left posterior inferior cerebellar artery. Mild narrowing distal right vertebral artery. Nonvisualized right anterior inferior cerebellar artery. Posterior cerebral artery branch vessel irregularity. Irregularity and narrowing superior cerebellar artery more notable on the right.   2D echo - - Left ventricle: The cavity size was normal. Systolic function was normal. The estimated ejection fraction was in the range of 55% to 60%. Wall motion was normal; there were no regional wall motion abnormalities. Doppler parameters are consistent with abnormal left ventricular relaxation (grade 1 diastolic dysfunction). - Pericardium, extracardiac: A trivial pericardial effusion was identified.  CUS - Bilateral: 1-39% ICA stenosis. Vertebral artery flow is antegrade.  MRI head 09/16/14 - Abnormal MRI scan of the brain showing diffuse dural thickening with abnormal appearance of bilateral parieto-occipital cortex on T2/FLAIR  images the finding of unclear significance with a differential discussed above. Stable incidental findings of Tornwaldt's cysts, opacification of the petrous apex and abnormal vessel in the ambient cistern.  MRA head 09/23/14 - Equivocal MRA head (without) demonstrating: 1. Small (40mm) posterior outpouching of the left left supra-clinoid internal carotid artery, may represent infundibular dilation at origin of left posterior communicating artery vs aneurysmal dilation. 2. No focal stenoses or occlusions of the medium to large sized vessels.   MRV 09/16/14 - Highly abnormal appearance of superior sagittal sinus with diminutive caliber likely related to patient's chronic dural thickening.  Abnormal appearance of deep cerebral venous system likely related to degenerative caliber from dural thickening versus partial thrombosis. Correlate with catheter angiogram if clinically necessary  CT head 02/05/15 1. No acute intracranial abnormality. 2. Stable appearance of the head with prominence dural calcifications.  MRV 05/19/15 1. Decreased flow signal in the superior sagittal sinus anteriorly, left transverse sinus near the midline, deep cerebral veins (great vein of Galen and straight sinus) and other areas as mentioned above.  2. Prominent cortical cerebral veins noted throughout. 3. These findings may be related to chronic dural calcification / thickening vs chronic thrombosis with partial recannalization. 4. Compared to MRV from 09/16/14, after accounting for differences in MRI field strengths, there is no significant change.   MRI brain 05/19/15 diffuse calcification and thickening of the tentorium with mild diffuse thickening of the meninges throughout. Abnormal appearance of and thickening of the right parieto-occipital cortex of unclear etiology. Abnormal blood vessel coursing through the ambient cistern to the left internal auditory canal. Overall no significant change compared with previous MRI scan dated 09/16/14  MRI and MRA 12/02/15 1.  No acute intracranial abnormality. 2. Mild chronic small vessel disease appears stable since March. 2016 right parietal signal abnormality which was felt related to PRES has resolved without encephalomalacia. 3. 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 This appears stable since the MRV on 05/17/2015. 4. Stable since 2016 and largely unremarkable intracranial MRA. ICA siphon atherosclerosis without stenosis. Small right cavernous ICA segment pseudo-lesion versus infundibulum.  CUS 12/03/15 - Bilateral: intimal wall thickening CCA. Mild soft plaque origin ICA. 1-39% ICA plaquing.  Vertebral artery flow is antegrade.  Mr Brain Wo Contrast 03/03/2017 1.    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 This appears stable when compared to the MRI from 12/02/2015. 2.   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 3.    There are no acute findings.   Mr Mrv Head Wo Cm 03/03/2017 1.   Absence of the anterior superior sagittal sinus and restricted flow in the posterior superior sagittal sinus. Absence of flow signal in the great vein of Galen and the straight sinus. 2.   The cortical cerebral veins and the left veins of Trolard and Labbe are prominent. 3.    Absent or reduced flow in the proximal left transverse sinus but normal flow more distally. 4.    Compared to the MR venogram from 05/17/2015, there is no interval change.  Component     Latest Ref Rng 05/23/2014 07/14/2014  Cholesterol     0 - 200 mg/dL 154   Triglycerides     <150 mg/dL 50   HDL Cholesterol     >39 mg/dL 53   Total CHOL/HDL Ratio      2.9  VLDL     0 - 40 mg/dL 10   LDL (calc)     0 - 99 mg/dL 91   Hemoglobin A1C     4.8 - 5.6 % 5.4   Mean Plasma Glucose      108   HIV Screen 4th Generation wRfx     Non Reactive  Non Reactive  Vitamin B-12     180 - 914 pg/mL  1227 (H)     Component     Latest Ref Rng & Units 10/02/2015  Cholesterol     0 - 200 mg/dL 129  Triglycerides     <150 mg/dL 64  HDL Cholesterol     >40 mg/dL 44  Total CHOL/HDL Ratio     RATIO 2.9  VLDL     0 - 40 mg/dL 13  LDL (calc)     0 - 99 mg/dL 72  Hemoglobin A1C     4.8 - 5.6 % 4.8  Mean Plasma Glucose     mg/dL 91    Assessment: As you may recall, he is a 57 y.o. African American male with PMH of HTN, ESRD on HD was admitted for acute onset of dizziness on 05/22/14. MRI showed possible PRES at right occipital region, and a left tiny corona radiata acute DWI  restriction, suspicious for infarct. Also, right occipital pole has tiny DWI changes which may related to PRES. Stroke work up including TTE and CUS unremarkable. LDL 91. His dizziness resolved and he was discharged with ASA 325 and lipitor. To document the resolution of PRES and to rule out CVT as there is some concern about cerebral veins in MRI, we repeated MRI, MRA and MRV and found that pt continued to have T2 hyperintensity bilateral parieto-occipital coretex and also has significant dural thickening with abnormal appearance of deep cerebral venous system with degenerative caliber, which may be able to explain the possible edema at bilateral parietooccipital region. 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 Not sure if there is any association with ESRD on HD. However, pt had no sign of chronic infection, no hydrocephalus, no complaint of HA or vision changes. Will continue to monitor and repeat MRI annually.   Had admission in 08/2015 and 10/2015 for LGIB, found to have colon ulcer and diverticulitis, treated with antibiotics, underwent colectomy with anastomosis and biopsy showed no malignancy. Also 09/2015 admission for atypical CP, EKG showed ST elevation, and cardiac enzyme and TTE negative, question for uremic pericarditis, put on low dose colchicine. 06/2016 underwent parathyroidectomy for secondary hyperparathyroidism.   11/2015 had seizure like activity with RUE jerking, weakness and numbness, resolved before reaching ED. MRI and MRA no acute abnormality. CUS and EEG normal. Put on keppra 750mg . However, since then, he started to have worsening headache. Temporal relationship consistent with Keppra use, and as per "up to date", headache is a common side effect of Keppra, ranging from 14-19%. Pt seizure like activity no LOC, RUE brief jerking, EEG normal, no seizure in the past, so keppra was stopped and his HA subsequently resolved.    02/20/17 HA increasing in frequency, HA description more consistent with migraine HA. 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 Fundus not able to see but 11/2016 ophthal exam no papilledema. Not able to use diamox due to ESRD on HD. Put on topamax 25mg  bid and HA resolved. Repeat MRI and MRV  no change.   Plan:  - continue topamax 25mg  twice a day for HA prevention. At HD day, take topamax morning dose after HD. - continue ASA and lipitor for stroke prevention - seizure precautions. - Follow up with your primary care physician for stroke risk factor modification. Recommend maintain blood pressure goal <130/80, diabetes with hemoglobin A1c goal below 6.5% and lipids with LDL cholesterol goal below 70 mg/dL.  - follow up with nephrology for ESRD on HD - follow up with cardiology and GI regularly. - check BP at home and record - follow up in 6 months with Janett Billow.   I spent more than 25 minutes of face to face time with the patient. Greater than 50% of time was spent in counseling and coordination of care. We discussed further HA prevention, continue topamax and follow up with GI/nephrology/ophthalmology.    No orders of the defined types were placed in this encounter.   Meds ordered this encounter  Medications  . topiramate (TOPAMAX) 25 MG tablet    Sig: Take 1 tablet (25 mg total) by mouth 2 (two) times daily. If on HD day, take the morning dose after dialysis.    Dispense:  120 tablet    Refill:  3    Patient Instructions  - continue topamax 25mg  twice a day for HA prevention. At HD day, take topamax morning dose after HD. - continue ASA and lipitor for stroke prevention - seizure precautions. - Follow up with your primary care physician for stroke risk factor modification. Recommend maintain blood pressure goal <130/80, diabetes with hemoglobin A1c goal below 6.5% and  lipids with LDL cholesterol goal below 70 mg/dL.  - follow up with nephrology for ESRD on HD - follow up with cardiology and GI regularly. - check BP at home and record - follow up in 6 months with Janett Billow.    Rosalin Hawking, MD PhD [facility] [address], [place], [place] [address] [phone]

## 2017-05-01 DIAGNOSIS — N186 End stage renal disease: Secondary | ICD-10-CM | POA: Diagnosis not present

## 2017-05-01 DIAGNOSIS — N2581 Secondary hyperparathyroidism of renal origin: Secondary | ICD-10-CM | POA: Diagnosis not present

## 2017-05-01 DIAGNOSIS — D509 Iron deficiency anemia, unspecified: Secondary | ICD-10-CM | POA: Diagnosis not present

## 2017-05-01 DIAGNOSIS — D631 Anemia in chronic kidney disease: Secondary | ICD-10-CM | POA: Diagnosis not present

## 2017-05-01 DIAGNOSIS — E1129 Type 2 diabetes mellitus with other diabetic kidney complication: Secondary | ICD-10-CM | POA: Diagnosis not present

## 2017-05-01 DIAGNOSIS — E875 Hyperkalemia: Secondary | ICD-10-CM | POA: Diagnosis not present

## 2017-05-03 DIAGNOSIS — E875 Hyperkalemia: Secondary | ICD-10-CM | POA: Diagnosis not present

## 2017-05-03 DIAGNOSIS — E1129 Type 2 diabetes mellitus with other diabetic kidney complication: Secondary | ICD-10-CM | POA: Diagnosis not present

## 2017-05-03 DIAGNOSIS — N186 End stage renal disease: Secondary | ICD-10-CM | POA: Diagnosis not present

## 2017-05-03 DIAGNOSIS — N2581 Secondary hyperparathyroidism of renal origin: Secondary | ICD-10-CM | POA: Diagnosis not present

## 2017-05-03 DIAGNOSIS — D509 Iron deficiency anemia, unspecified: Secondary | ICD-10-CM | POA: Diagnosis not present

## 2017-05-03 DIAGNOSIS — D631 Anemia in chronic kidney disease: Secondary | ICD-10-CM | POA: Diagnosis not present

## 2017-05-06 DIAGNOSIS — N186 End stage renal disease: Secondary | ICD-10-CM | POA: Diagnosis not present

## 2017-05-06 DIAGNOSIS — E1129 Type 2 diabetes mellitus with other diabetic kidney complication: Secondary | ICD-10-CM | POA: Diagnosis not present

## 2017-05-06 DIAGNOSIS — E875 Hyperkalemia: Secondary | ICD-10-CM | POA: Diagnosis not present

## 2017-05-06 DIAGNOSIS — D509 Iron deficiency anemia, unspecified: Secondary | ICD-10-CM | POA: Diagnosis not present

## 2017-05-06 DIAGNOSIS — D631 Anemia in chronic kidney disease: Secondary | ICD-10-CM | POA: Diagnosis not present

## 2017-05-06 DIAGNOSIS — N2581 Secondary hyperparathyroidism of renal origin: Secondary | ICD-10-CM | POA: Diagnosis not present

## 2017-05-08 DIAGNOSIS — E875 Hyperkalemia: Secondary | ICD-10-CM | POA: Diagnosis not present

## 2017-05-08 DIAGNOSIS — N186 End stage renal disease: Secondary | ICD-10-CM | POA: Diagnosis not present

## 2017-05-08 DIAGNOSIS — D509 Iron deficiency anemia, unspecified: Secondary | ICD-10-CM | POA: Diagnosis not present

## 2017-05-08 DIAGNOSIS — E1129 Type 2 diabetes mellitus with other diabetic kidney complication: Secondary | ICD-10-CM | POA: Diagnosis not present

## 2017-05-08 DIAGNOSIS — D631 Anemia in chronic kidney disease: Secondary | ICD-10-CM | POA: Diagnosis not present

## 2017-05-08 DIAGNOSIS — N2581 Secondary hyperparathyroidism of renal origin: Secondary | ICD-10-CM | POA: Diagnosis not present

## 2017-05-10 DIAGNOSIS — E1129 Type 2 diabetes mellitus with other diabetic kidney complication: Secondary | ICD-10-CM | POA: Diagnosis not present

## 2017-05-10 DIAGNOSIS — E875 Hyperkalemia: Secondary | ICD-10-CM | POA: Diagnosis not present

## 2017-05-10 DIAGNOSIS — D631 Anemia in chronic kidney disease: Secondary | ICD-10-CM | POA: Diagnosis not present

## 2017-05-10 DIAGNOSIS — N2581 Secondary hyperparathyroidism of renal origin: Secondary | ICD-10-CM | POA: Diagnosis not present

## 2017-05-10 DIAGNOSIS — D509 Iron deficiency anemia, unspecified: Secondary | ICD-10-CM | POA: Diagnosis not present

## 2017-05-10 DIAGNOSIS — N186 End stage renal disease: Secondary | ICD-10-CM | POA: Diagnosis not present

## 2017-05-13 DIAGNOSIS — E875 Hyperkalemia: Secondary | ICD-10-CM | POA: Diagnosis not present

## 2017-05-13 DIAGNOSIS — I12 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 stage renal disease: Secondary | ICD-10-CM | POA: Diagnosis not present

## 2017-05-13 DIAGNOSIS — D631 Anemia in chronic kidney disease: Secondary | ICD-10-CM | POA: Diagnosis not present

## 2017-05-13 DIAGNOSIS — Z992 Dependence on renal dialysis: Secondary | ICD-10-CM | POA: Diagnosis not present

## 2017-05-13 DIAGNOSIS — N186 End stage renal disease: Secondary | ICD-10-CM | POA: Diagnosis not present

## 2017-05-13 DIAGNOSIS — N2581 Secondary hyperparathyroidism of renal origin: Secondary | ICD-10-CM | POA: Diagnosis not present

## 2017-05-15 DIAGNOSIS — N2581 Secondary hyperparathyroidism of renal origin: Secondary | ICD-10-CM | POA: Diagnosis not present

## 2017-05-15 DIAGNOSIS — E875 Hyperkalemia: Secondary | ICD-10-CM | POA: Diagnosis not present

## 2017-05-15 DIAGNOSIS — N186 End stage renal disease: Secondary | ICD-10-CM | POA: Diagnosis not present

## 2017-05-15 DIAGNOSIS — D631 Anemia in chronic kidney disease: Secondary | ICD-10-CM | POA: Diagnosis not present

## 2017-05-17 DIAGNOSIS — E875 Hyperkalemia: Secondary | ICD-10-CM | POA: Diagnosis not present

## 2017-05-17 DIAGNOSIS — N2581 Secondary hyperparathyroidism of renal origin: Secondary | ICD-10-CM | POA: Diagnosis not present

## 2017-05-17 DIAGNOSIS — D631 Anemia in chronic kidney disease: Secondary | ICD-10-CM | POA: Diagnosis not present

## 2017-05-17 DIAGNOSIS — N186 End stage renal disease: Secondary | ICD-10-CM | POA: Diagnosis not present

## 2017-05-20 DIAGNOSIS — N2581 Secondary hyperparathyroidism of renal origin: Secondary | ICD-10-CM | POA: Diagnosis not present

## 2017-05-20 DIAGNOSIS — D631 Anemia in chronic kidney disease: Secondary | ICD-10-CM | POA: Diagnosis not present

## 2017-05-20 DIAGNOSIS — E875 Hyperkalemia: Secondary | ICD-10-CM | POA: Diagnosis not present

## 2017-05-20 DIAGNOSIS — N186 End stage renal disease: Secondary | ICD-10-CM | POA: Diagnosis not present

## 2017-05-22 DIAGNOSIS — E875 Hyperkalemia: Secondary | ICD-10-CM | POA: Diagnosis not present

## 2017-05-22 DIAGNOSIS — N186 End stage renal disease: Secondary | ICD-10-CM | POA: Diagnosis not present

## 2017-05-22 DIAGNOSIS — D631 Anemia in chronic kidney disease: Secondary | ICD-10-CM | POA: Diagnosis not present

## 2017-05-22 DIAGNOSIS — N2581 Secondary hyperparathyroidism of renal origin: Secondary | ICD-10-CM | POA: Diagnosis not present

## 2017-05-24 DIAGNOSIS — E875 Hyperkalemia: Secondary | ICD-10-CM | POA: Diagnosis not present

## 2017-05-24 DIAGNOSIS — D631 Anemia in chronic kidney disease: Secondary | ICD-10-CM | POA: Diagnosis not present

## 2017-05-24 DIAGNOSIS — N2581 Secondary hyperparathyroidism of renal origin: Secondary | ICD-10-CM | POA: Diagnosis not present

## 2017-05-24 DIAGNOSIS — N186 End stage renal disease: Secondary | ICD-10-CM | POA: Diagnosis not present

## 2017-05-27 DIAGNOSIS — N2581 Secondary hyperparathyroidism of renal origin: Secondary | ICD-10-CM | POA: Diagnosis not present

## 2017-05-27 DIAGNOSIS — E875 Hyperkalemia: Secondary | ICD-10-CM | POA: Diagnosis not present

## 2017-05-27 DIAGNOSIS — N186 End stage renal disease: Secondary | ICD-10-CM | POA: Diagnosis not present

## 2017-05-27 DIAGNOSIS — D631 Anemia in chronic kidney disease: Secondary | ICD-10-CM | POA: Diagnosis not present

## 2017-05-29 DIAGNOSIS — N186 End stage renal disease: Secondary | ICD-10-CM | POA: Diagnosis not present

## 2017-05-29 DIAGNOSIS — N2581 Secondary hyperparathyroidism of renal origin: Secondary | ICD-10-CM | POA: Diagnosis not present

## 2017-05-29 DIAGNOSIS — D631 Anemia in chronic kidney disease: Secondary | ICD-10-CM | POA: Diagnosis not present

## 2017-05-29 DIAGNOSIS — E875 Hyperkalemia: Secondary | ICD-10-CM | POA: Diagnosis not present

## 2017-05-31 DIAGNOSIS — D631 Anemia in chronic kidney disease: Secondary | ICD-10-CM | POA: Diagnosis not present

## 2017-05-31 DIAGNOSIS — N2581 Secondary hyperparathyroidism of renal origin: Secondary | ICD-10-CM | POA: Diagnosis not present

## 2017-05-31 DIAGNOSIS — E875 Hyperkalemia: Secondary | ICD-10-CM | POA: Diagnosis not present

## 2017-05-31 DIAGNOSIS — N186 End stage renal disease: Secondary | ICD-10-CM | POA: Diagnosis not present

## 2017-06-03 DIAGNOSIS — D631 Anemia in chronic kidney disease: Secondary | ICD-10-CM | POA: Diagnosis not present

## 2017-06-03 DIAGNOSIS — N186 End stage renal disease: Secondary | ICD-10-CM | POA: Diagnosis not present

## 2017-06-03 DIAGNOSIS — E875 Hyperkalemia: Secondary | ICD-10-CM | POA: Diagnosis not present

## 2017-06-03 DIAGNOSIS — N2581 Secondary hyperparathyroidism of renal origin: Secondary | ICD-10-CM | POA: Diagnosis not present

## 2017-06-05 DIAGNOSIS — D631 Anemia in chronic kidney disease: Secondary | ICD-10-CM | POA: Diagnosis not present

## 2017-06-05 DIAGNOSIS — N186 End stage renal disease: Secondary | ICD-10-CM | POA: Diagnosis not present

## 2017-06-05 DIAGNOSIS — E875 Hyperkalemia: Secondary | ICD-10-CM | POA: Diagnosis not present

## 2017-06-05 DIAGNOSIS — N2581 Secondary hyperparathyroidism of renal origin: Secondary | ICD-10-CM | POA: Diagnosis not present

## 2017-06-05 DIAGNOSIS — E1129 Type 2 diabetes mellitus with other diabetic kidney complication: Secondary | ICD-10-CM | POA: Diagnosis not present

## 2017-06-07 DIAGNOSIS — N186 End stage renal disease: Secondary | ICD-10-CM | POA: Diagnosis not present

## 2017-06-07 DIAGNOSIS — D631 Anemia in chronic kidney disease: Secondary | ICD-10-CM | POA: Diagnosis not present

## 2017-06-07 DIAGNOSIS — E875 Hyperkalemia: Secondary | ICD-10-CM | POA: Diagnosis not present

## 2017-06-07 DIAGNOSIS — N2581 Secondary hyperparathyroidism of renal origin: Secondary | ICD-10-CM | POA: Diagnosis not present

## 2017-06-10 DIAGNOSIS — N2581 Secondary hyperparathyroidism of renal origin: Secondary | ICD-10-CM | POA: Diagnosis not present

## 2017-06-10 DIAGNOSIS — D631 Anemia in chronic kidney disease: Secondary | ICD-10-CM | POA: Diagnosis not present

## 2017-06-10 DIAGNOSIS — E875 Hyperkalemia: Secondary | ICD-10-CM | POA: Diagnosis not present

## 2017-06-10 DIAGNOSIS — N186 End stage renal disease: Secondary | ICD-10-CM | POA: Diagnosis not present

## 2017-06-12 DIAGNOSIS — N2581 Secondary hyperparathyroidism of renal origin: Secondary | ICD-10-CM | POA: Diagnosis not present

## 2017-06-12 DIAGNOSIS — E875 Hyperkalemia: Secondary | ICD-10-CM | POA: Diagnosis not present

## 2017-06-12 DIAGNOSIS — E8770 Fluid overload, unspecified: Secondary | ICD-10-CM | POA: Diagnosis not present

## 2017-06-12 DIAGNOSIS — E8779 Other fluid overload: Secondary | ICD-10-CM | POA: Diagnosis not present

## 2017-06-12 DIAGNOSIS — Z992 Dependence on renal dialysis: Secondary | ICD-10-CM | POA: Diagnosis not present

## 2017-06-12 DIAGNOSIS — D631 Anemia in chronic kidney disease: Secondary | ICD-10-CM | POA: Diagnosis not present

## 2017-06-12 DIAGNOSIS — N186 End stage renal disease: Secondary | ICD-10-CM | POA: Diagnosis not present

## 2017-06-12 DIAGNOSIS — I12 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 stage renal disease: Secondary | ICD-10-CM | POA: Diagnosis not present

## 2017-06-14 DIAGNOSIS — D631 Anemia in chronic kidney disease: Secondary | ICD-10-CM | POA: Diagnosis not present

## 2017-06-14 DIAGNOSIS — E875 Hyperkalemia: Secondary | ICD-10-CM | POA: Diagnosis not present

## 2017-06-14 DIAGNOSIS — E8779 Other fluid overload: Secondary | ICD-10-CM | POA: Diagnosis not present

## 2017-06-14 DIAGNOSIS — N186 End stage renal disease: Secondary | ICD-10-CM | POA: Diagnosis not present

## 2017-06-14 DIAGNOSIS — E8770 Fluid overload, unspecified: Secondary | ICD-10-CM | POA: Diagnosis not present

## 2017-06-14 DIAGNOSIS — N2581 Secondary hyperparathyroidism of renal origin: Secondary | ICD-10-CM | POA: Diagnosis not present

## 2017-06-17 DIAGNOSIS — E875 Hyperkalemia: Secondary | ICD-10-CM | POA: Diagnosis not present

## 2017-06-17 DIAGNOSIS — E8770 Fluid overload, unspecified: Secondary | ICD-10-CM | POA: Diagnosis not present

## 2017-06-17 DIAGNOSIS — D631 Anemia in chronic kidney disease: Secondary | ICD-10-CM | POA: Diagnosis not present

## 2017-06-17 DIAGNOSIS — N186 End stage renal disease: Secondary | ICD-10-CM | POA: Diagnosis not present

## 2017-06-17 DIAGNOSIS — E8779 Other fluid overload: Secondary | ICD-10-CM | POA: Diagnosis not present

## 2017-06-17 DIAGNOSIS — N2581 Secondary hyperparathyroidism of renal origin: Secondary | ICD-10-CM | POA: Diagnosis not present

## 2017-06-19 DIAGNOSIS — E8779 Other fluid overload: Secondary | ICD-10-CM | POA: Diagnosis not present

## 2017-06-19 DIAGNOSIS — E875 Hyperkalemia: Secondary | ICD-10-CM | POA: Diagnosis not present

## 2017-06-19 DIAGNOSIS — E8770 Fluid overload, unspecified: Secondary | ICD-10-CM | POA: Diagnosis not present

## 2017-06-19 DIAGNOSIS — N2581 Secondary hyperparathyroidism of renal origin: Secondary | ICD-10-CM | POA: Diagnosis not present

## 2017-06-19 DIAGNOSIS — N186 End stage renal disease: Secondary | ICD-10-CM | POA: Diagnosis not present

## 2017-06-19 DIAGNOSIS — D631 Anemia in chronic kidney disease: Secondary | ICD-10-CM | POA: Diagnosis not present

## 2017-06-21 DIAGNOSIS — E8770 Fluid overload, unspecified: Secondary | ICD-10-CM | POA: Diagnosis not present

## 2017-06-21 DIAGNOSIS — N186 End stage renal disease: Secondary | ICD-10-CM | POA: Diagnosis not present

## 2017-06-21 DIAGNOSIS — D631 Anemia in chronic kidney disease: Secondary | ICD-10-CM | POA: Diagnosis not present

## 2017-06-21 DIAGNOSIS — N2581 Secondary hyperparathyroidism of renal origin: Secondary | ICD-10-CM | POA: Diagnosis not present

## 2017-06-21 DIAGNOSIS — E875 Hyperkalemia: Secondary | ICD-10-CM | POA: Diagnosis not present

## 2017-06-21 DIAGNOSIS — E8779 Other fluid overload: Secondary | ICD-10-CM | POA: Diagnosis not present

## 2017-06-24 DIAGNOSIS — E8779 Other fluid overload: Secondary | ICD-10-CM | POA: Diagnosis not present

## 2017-06-24 DIAGNOSIS — E875 Hyperkalemia: Secondary | ICD-10-CM | POA: Diagnosis not present

## 2017-06-24 DIAGNOSIS — E8770 Fluid overload, unspecified: Secondary | ICD-10-CM | POA: Diagnosis not present

## 2017-06-24 DIAGNOSIS — D631 Anemia in chronic kidney disease: Secondary | ICD-10-CM | POA: Diagnosis not present

## 2017-06-24 DIAGNOSIS — N186 End stage renal disease: Secondary | ICD-10-CM | POA: Diagnosis not present

## 2017-06-24 DIAGNOSIS — N2581 Secondary hyperparathyroidism of renal origin: Secondary | ICD-10-CM | POA: Diagnosis not present

## 2017-06-28 DIAGNOSIS — N2581 Secondary hyperparathyroidism of renal origin: Secondary | ICD-10-CM | POA: Diagnosis not present

## 2017-06-28 DIAGNOSIS — N186 End stage renal disease: Secondary | ICD-10-CM | POA: Diagnosis not present

## 2017-06-28 DIAGNOSIS — E875 Hyperkalemia: Secondary | ICD-10-CM | POA: Diagnosis not present

## 2017-06-28 DIAGNOSIS — D631 Anemia in chronic kidney disease: Secondary | ICD-10-CM | POA: Diagnosis not present

## 2017-06-28 DIAGNOSIS — E8770 Fluid overload, unspecified: Secondary | ICD-10-CM | POA: Diagnosis not present

## 2017-06-28 DIAGNOSIS — E8779 Other fluid overload: Secondary | ICD-10-CM | POA: Diagnosis not present

## 2017-07-01 DIAGNOSIS — N186 End stage renal disease: Secondary | ICD-10-CM | POA: Diagnosis not present

## 2017-07-01 DIAGNOSIS — D631 Anemia in chronic kidney disease: Secondary | ICD-10-CM | POA: Diagnosis not present

## 2017-07-01 DIAGNOSIS — E8779 Other fluid overload: Secondary | ICD-10-CM | POA: Diagnosis not present

## 2017-07-01 DIAGNOSIS — E875 Hyperkalemia: Secondary | ICD-10-CM | POA: Diagnosis not present

## 2017-07-01 DIAGNOSIS — N2581 Secondary hyperparathyroidism of renal origin: Secondary | ICD-10-CM | POA: Diagnosis not present

## 2017-07-01 DIAGNOSIS — E8770 Fluid overload, unspecified: Secondary | ICD-10-CM | POA: Diagnosis not present

## 2017-07-03 DIAGNOSIS — N2581 Secondary hyperparathyroidism of renal origin: Secondary | ICD-10-CM | POA: Diagnosis not present

## 2017-07-03 DIAGNOSIS — T82858A Stenosis of vascular prosthetic devices, implants and grafts, initial encounter: Secondary | ICD-10-CM | POA: Diagnosis not present

## 2017-07-03 DIAGNOSIS — N186 End stage renal disease: Secondary | ICD-10-CM | POA: Diagnosis not present

## 2017-07-03 DIAGNOSIS — Z992 Dependence on renal dialysis: Secondary | ICD-10-CM | POA: Diagnosis not present

## 2017-07-03 DIAGNOSIS — D631 Anemia in chronic kidney disease: Secondary | ICD-10-CM | POA: Diagnosis not present

## 2017-07-03 DIAGNOSIS — E875 Hyperkalemia: Secondary | ICD-10-CM | POA: Diagnosis not present

## 2017-07-03 DIAGNOSIS — I871 Compression of vein: Secondary | ICD-10-CM | POA: Diagnosis not present

## 2017-07-03 DIAGNOSIS — E8770 Fluid overload, unspecified: Secondary | ICD-10-CM | POA: Diagnosis not present

## 2017-07-03 DIAGNOSIS — E8779 Other fluid overload: Secondary | ICD-10-CM | POA: Diagnosis not present

## 2017-07-05 ENCOUNTER — Encounter: Payer: Self-pay | Admitting: *Deleted

## 2017-07-05 ENCOUNTER — Encounter: Payer: Self-pay | Admitting: Vascular Surgery

## 2017-07-05 ENCOUNTER — Other Ambulatory Visit: Payer: Self-pay

## 2017-07-05 ENCOUNTER — Other Ambulatory Visit: Payer: Self-pay | Admitting: *Deleted

## 2017-07-05 ENCOUNTER — Ambulatory Visit (INDEPENDENT_AMBULATORY_CARE_PROVIDER_SITE_OTHER): Payer: Medicare Other | Admitting: Vascular Surgery

## 2017-07-05 VITALS — BP 133/85 | HR 98 | Resp 20 | Ht 75.0 in | Wt 200.2 lb

## 2017-07-05 DIAGNOSIS — E875 Hyperkalemia: Secondary | ICD-10-CM | POA: Diagnosis not present

## 2017-07-05 DIAGNOSIS — N186 End stage renal disease: Secondary | ICD-10-CM | POA: Diagnosis not present

## 2017-07-05 DIAGNOSIS — E8770 Fluid overload, unspecified: Secondary | ICD-10-CM | POA: Diagnosis not present

## 2017-07-05 DIAGNOSIS — Z992 Dependence on renal dialysis: Secondary | ICD-10-CM

## 2017-07-05 DIAGNOSIS — N2581 Secondary hyperparathyroidism of renal origin: Secondary | ICD-10-CM | POA: Diagnosis not present

## 2017-07-05 DIAGNOSIS — E8779 Other fluid overload: Secondary | ICD-10-CM | POA: Diagnosis not present

## 2017-07-05 DIAGNOSIS — D631 Anemia in chronic kidney disease: Secondary | ICD-10-CM | POA: Diagnosis not present

## 2017-07-05 NOTE — Progress Notes (Signed)
Patient ID: Carl Hannan Sr., male   DOB: 02-Dec-1960, 57 y.o.   MRN: [id number]  Reason for Consult: New Patient (Initial Visit) (eval access - R LA AVF - HD MWF )   Referred by Carl Maes, MD  Subjective:     HPI:  Carl Hinks Sr. is a 57 y.o. male with history end-stage renal disease.  He recently underwent fistulogram for malfunction of his right arm AV fistula.  He has notable clot in 2 areas that were marked by Dr. Augustin Coupe.  He now presents for evaluation for surgery.  He does not take any blood thinners.  He is on dialysis Monday Wednesday and Friday.  Past Medical History:  Diagnosis Date  . Anemia   . Arthritis   . Chronic headaches   . Diverticulitis   . ESRD (end stage renal disease) ([place])     On Renal Transplant List," [facility]; MWF" (10/23/2016)  . GI bleed   . Hemodialysis patient ([place])    M-W-F  . Hypertension   . Parathyroid abnormality (H[facility])    ectopic parathyroid gland  . Presence of arteriovenous fistula for hemodialysis, primary ([place])    RUE  . Refusal of blood product    NO WHOLE BLOOD PROUCTS  . Secondary hyperparathyroidism ([place])   . Seizures ([place])    one episode in past  . Sleep apnea    doesn't use CPAP anymore since weight loss  . Stroke [facility])    no residual   Family History  Problem Relation Age of Onset  . Diabetes Father   . Stroke Father   . Hypertension Father   . Uterine cancer Mother   . Lupus Sister   . Stroke Sister   . Hypertension Sister   . Anuerysm Brother        brain   Past Surgical History:  Procedure Laterality Date  . AV FISTULA PLACEMENT Right    right arm  . CATARACT EXTRACTION W/ INTRAOCULAR LENS  IMPLANT, BILATERAL    . COLON SURGERY    . COLONOSCOPY N/A 08/04/2015   Procedure: COLONOSCOPY;  Surgeon: Jerene Bears, MD;  Location[facility] ENDOSCOPY;  Service: Endoscopy;  Laterality: N/A;  . graft left arm Left    for dialysis x 2  . INSERTION OF DIALYSIS CATHETER     Rt chest  . LAPAROSCOPIC RIGHT COLECTOMY  N/A 08/05/2015   Procedure: LAPAROSCOPIC RIGHT COLECTOMY- ASCENDING;  Surgeon: Stark Klein, MD;  Location: [place];  Service: General;  Laterality: N/A;  . PARATHYROIDECTOMY N/A 06/12/2016   Procedure: TOTAL PARATHYROIDECTOMY WITH AUTOTRANSPLANTATION TO LEFT FOREARM;  Surgeon: Armandina Gemma, MD;  Location: [place];  Service: General;  Laterality: N/A;  . PARATHYROIDECTOMY  10/23/2016  . PARATHYROIDECTOMY N/A 10/23/2016   Procedure: PARATHYROIDECTOMY;  Surgeon: Armandina Gemma, MD;  Location: [facility] OR;  Service: General;  Laterality: N/A;    Short Social History:  Social History   Tobacco Use  . Smoking status: Former Smoker    Last attempt to quit: 06/19/1990    Years since quitting: 27.0  . Smokeless tobacco: Never Used  . Tobacco comment: rarely cigar  Substance Use Topics  . Alcohol use: Yes    Alcohol/week: 0.6 oz    Types: 1 Cans of beer per week    Comment: occasional    Allergies  Allergen Reactions  . Infed [Iron Dextran] Other (See Comments)    Decreased BP   . Oxycodone Nausea Only  . Vicodin [Hydrocodone-Acetaminophen] Other (See Comments)  Decreased BP  . Diclofenac Other (See Comments)    Unknown  . Whole Blood Other (See Comments)    Patient refuses whole blood "unless I am going to die"    Current Outpatient Medications  Medication Sig Dispense Refill  . aspirin EC 81 MG tablet Take 1 tablet (81 mg total) by mouth daily. (Patient taking differently: Take 81 mg by mouth every other day. ) 90 tablet 3  . atorvastatin (LIPITOR) 10 MG tablet Take 1 tablet (10 mg total) by mouth daily at 6 PM. 30 tablet 0  . butalbital-acetaminophen-caffeine (FIORICET, ESGIC) 50-325-40 MG tablet Take 1 tablet by mouth every 12 (twelve) hours as needed for headache. No more than 2 tabs a day and no more than 5 tabs a week. 20 tablet 0  . calcium acetate (PHOSLO) 667 MG capsule Take 2,001-3,335 mg by mouth See admin instructions. 3,335 mg three times a day with meals and [phone] mg with snacks     . cinacalcet (SENSIPAR) 90 MG tablet Take 180 mg by mouth daily.    . folic acid-vitamin b complex-vitamin c-selenium-zinc (DIALYVITE) 3 MG TABS tablet Take by mouth.    . gabapentin (NEURONTIN) 100 MG capsule Take 100 mg by mouth 2 (two) times daily.     Marland Kitchen ibuprofen (ADVIL,MOTRIN) 800 MG tablet Take 800 mg by mouth 3 (three) times daily as needed for mild pain.     . multivitamin (RENA-VIT) TABS tablet Take 1 tablet by mouth daily.    Marland Kitchen omeprazole (PRILOSEC) 20 MG capsule Take 1 capsule (20 mg total) by mouth daily. 30-60 mins before breakfast. 30 capsule 3  . RENVELA 800 MG tablet Take 1,600 mg by mouth 3 (three) times daily with meals.    . sevelamer carbonate (RENVELA) 800 MG tablet Take by mouth.    . sorbitol 70 % solution Take 15 mLs by mouth daily as needed.    . topiramate (TOPAMAX) 25 MG tablet Take 1 tablet (25 mg total) by mouth 2 (two) times daily. If on HD day, take the morning dose after dialysis. 120 tablet 3   No current facility-administered medications for this visit.     Review of Systems  Constitutional:  Constitutional negative. HENT: HENT negative.  Eyes: Eyes negative.  Respiratory: Respiratory negative.  Cardiovascular: Cardiovascular negative.  GI: Gastrointestinal negative.  Musculoskeletal: Musculoskeletal negative.  Skin: Skin negative.  Neurological: Neurological negative. Hematologic: Hematologic/lymphatic negative.  Psychiatric: Psychiatric negative.        Objective:  Objective   Vitals:   07/05/17 1547  BP: 133/85  Pulse: 98  Resp: 20  SpO2: 98%  Weight: 200 lb 3.2 oz (90.8 kg)  Height: 6\' 3"  (1.905 m)   Body mass index is 25.02 kg/m.  Physical Exam  Constitutional: He is oriented to person, place, and time. He appears well-developed.  HENT:  Head: Normocephalic.  Eyes: Pupils are equal, round, and reactive to light.  Neck: Normal range of motion.  Cardiovascular: Normal rate.  Pulses:      Radial pulses are 2+ on the right  side, and 2+ on the left side.  Pulmonary/Chest: Effort normal.  Musculoskeletal: Normal range of motion. He exhibits no edema.  Right arm AV fistula has 2 large pseudoaneurysms where the thrill is decreased near the antecubitum.  Neurological: He is alert and oriented to person, place, and time.  Skin: Skin is dry. Capillary refill takes less than 2 seconds.  Psychiatric: He has a normal mood and affect. His behavior is normal.  Thought content normal.       Assessment/Plan:     57 year old male with end-stage renal disease on dialysis via right arm AV fistula but is having difficulty and there is thrombus into areas of pseudoaneurysm.  We will revise these 2 pseudoaneurysms near the antecubitum but I have asked him to have the nurses stick towards the wrist to make sure we can get to needles working in that area.  If we can then we will revise both at one time and if not then we will revise the pseudoaneurysms in a staged fashion to make sure he continues to have a usable fistula.  He dialyzes on Mondays Wednesdays and Fridays and we will get him set up on a Tuesday or Thursday in the near future.    Waynetta Sandy MD [facility]

## 2017-07-06 DIAGNOSIS — N186 End stage renal disease: Secondary | ICD-10-CM | POA: Diagnosis not present

## 2017-07-06 DIAGNOSIS — E8770 Fluid overload, unspecified: Secondary | ICD-10-CM | POA: Diagnosis not present

## 2017-07-06 DIAGNOSIS — D631 Anemia in chronic kidney disease: Secondary | ICD-10-CM | POA: Diagnosis not present

## 2017-07-06 DIAGNOSIS — E875 Hyperkalemia: Secondary | ICD-10-CM | POA: Diagnosis not present

## 2017-07-06 DIAGNOSIS — E8779 Other fluid overload: Secondary | ICD-10-CM | POA: Diagnosis not present

## 2017-07-06 DIAGNOSIS — N2581 Secondary hyperparathyroidism of renal origin: Secondary | ICD-10-CM | POA: Diagnosis not present

## 2017-07-09 DIAGNOSIS — E8779 Other fluid overload: Secondary | ICD-10-CM | POA: Diagnosis not present

## 2017-07-09 DIAGNOSIS — N2581 Secondary hyperparathyroidism of renal origin: Secondary | ICD-10-CM | POA: Diagnosis not present

## 2017-07-09 DIAGNOSIS — E875 Hyperkalemia: Secondary | ICD-10-CM | POA: Diagnosis not present

## 2017-07-09 DIAGNOSIS — N186 End stage renal disease: Secondary | ICD-10-CM | POA: Diagnosis not present

## 2017-07-09 DIAGNOSIS — E8770 Fluid overload, unspecified: Secondary | ICD-10-CM | POA: Diagnosis not present

## 2017-07-09 DIAGNOSIS — D631 Anemia in chronic kidney disease: Secondary | ICD-10-CM | POA: Diagnosis not present

## 2017-07-10 DIAGNOSIS — E875 Hyperkalemia: Secondary | ICD-10-CM | POA: Diagnosis not present

## 2017-07-10 DIAGNOSIS — D631 Anemia in chronic kidney disease: Secondary | ICD-10-CM | POA: Diagnosis not present

## 2017-07-10 DIAGNOSIS — N186 End stage renal disease: Secondary | ICD-10-CM | POA: Diagnosis not present

## 2017-07-10 DIAGNOSIS — N2581 Secondary hyperparathyroidism of renal origin: Secondary | ICD-10-CM | POA: Diagnosis not present

## 2017-07-10 DIAGNOSIS — E8770 Fluid overload, unspecified: Secondary | ICD-10-CM | POA: Diagnosis not present

## 2017-07-10 DIAGNOSIS — E8779 Other fluid overload: Secondary | ICD-10-CM | POA: Diagnosis not present

## 2017-07-12 DIAGNOSIS — E8779 Other fluid overload: Secondary | ICD-10-CM | POA: Diagnosis not present

## 2017-07-12 DIAGNOSIS — E875 Hyperkalemia: Secondary | ICD-10-CM | POA: Diagnosis not present

## 2017-07-12 DIAGNOSIS — N186 End stage renal disease: Secondary | ICD-10-CM | POA: Diagnosis not present

## 2017-07-12 DIAGNOSIS — E8770 Fluid overload, unspecified: Secondary | ICD-10-CM | POA: Diagnosis not present

## 2017-07-12 DIAGNOSIS — N2581 Secondary hyperparathyroidism of renal origin: Secondary | ICD-10-CM | POA: Diagnosis not present

## 2017-07-12 DIAGNOSIS — D631 Anemia in chronic kidney disease: Secondary | ICD-10-CM | POA: Diagnosis not present

## 2017-07-13 DIAGNOSIS — I12 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 stage renal disease: Secondary | ICD-10-CM | POA: Diagnosis not present

## 2017-07-13 DIAGNOSIS — Z992 Dependence on renal dialysis: Secondary | ICD-10-CM | POA: Diagnosis not present

## 2017-07-13 DIAGNOSIS — N186 End stage renal disease: Secondary | ICD-10-CM | POA: Diagnosis not present

## 2017-07-15 ENCOUNTER — Encounter (HOSPITAL_COMMUNITY): Payer: Self-pay | Admitting: *Deleted

## 2017-07-15 ENCOUNTER — Other Ambulatory Visit: Payer: Self-pay

## 2017-07-15 DIAGNOSIS — N2581 Secondary hyperparathyroidism of renal origin: Secondary | ICD-10-CM | POA: Diagnosis not present

## 2017-07-15 DIAGNOSIS — D631 Anemia in chronic kidney disease: Secondary | ICD-10-CM | POA: Diagnosis not present

## 2017-07-15 DIAGNOSIS — E875 Hyperkalemia: Secondary | ICD-10-CM | POA: Diagnosis not present

## 2017-07-15 DIAGNOSIS — N186 End stage renal disease: Secondary | ICD-10-CM | POA: Diagnosis not present

## 2017-07-15 NOTE — Progress Notes (Addendum)
Pt denies SOB, chest pain, and being under the care of a cardiologist. Pt denies having a cardiac cath. Pt denies having a chest x ary within the last year. Pt stated that he was instructed to stop taking Aspirin due to future procedure. Pt made aware to stop taking vitamins, fish oil and herbal medications. Do not take any NSAIDs ie: Ibuprofen, Advil, Naproxen (Aleve), Motrin, BC and Goody Powder or any medication containing Aspirin. Pt verbalized understanding of all pre-op instructions.

## 2017-07-16 ENCOUNTER — Encounter (HOSPITAL_COMMUNITY): Payer: Self-pay

## 2017-07-16 ENCOUNTER — Ambulatory Visit (HOSPITAL_COMMUNITY): Payer: Medicare Other | Admitting: Certified Registered"

## 2017-07-16 ENCOUNTER — Encounter (HOSPITAL_COMMUNITY)
Admission: RE | Disposition: A | Discharge status: Home / Self Care | Payer: Self-pay | Source: Ambulatory Visit | Attending: Vascular Surgery

## 2017-07-16 ENCOUNTER — Ambulatory Visit (HOSPITAL_COMMUNITY)
Admission: RE | Admit: 2017-07-16 | Discharge: 2017-07-16 | Disposition: A | Discharge status: Home / Self Care | Payer: Medicare Other | Source: Ambulatory Visit | Attending: Vascular Surgery | Admitting: Vascular Surgery

## 2017-07-16 ENCOUNTER — Telehealth: Payer: Self-pay | Admitting: Vascular Surgery

## 2017-07-16 DIAGNOSIS — Z888 Allergy status to other drugs, medicaments and biological substances status: Secondary | ICD-10-CM | POA: Diagnosis not present

## 2017-07-16 DIAGNOSIS — Z885 Allergy status to narcotic agent status: Secondary | ICD-10-CM | POA: Insufficient documentation

## 2017-07-16 DIAGNOSIS — Z79899 Other long term (current) drug therapy: Secondary | ICD-10-CM | POA: Diagnosis not present

## 2017-07-16 DIAGNOSIS — N2581 Secondary hyperparathyroidism of renal origin: Secondary | ICD-10-CM | POA: Diagnosis not present

## 2017-07-16 DIAGNOSIS — I12 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 stage renal disease: Secondary | ICD-10-CM | POA: Insufficient documentation

## 2017-07-16 DIAGNOSIS — N186 End stage renal disease: Secondary | ICD-10-CM | POA: Insufficient documentation

## 2017-07-16 DIAGNOSIS — Z992 Dependence on renal dialysis: Secondary | ICD-10-CM | POA: Diagnosis not present

## 2017-07-16 DIAGNOSIS — Z87891 Personal history of nicotine dependence: Secondary | ICD-10-CM | POA: Insufficient documentation

## 2017-07-16 DIAGNOSIS — Z8673 Personal history of transient ischemic attack (TIA), and cerebral infarction without residual deficits: Secondary | ICD-10-CM | POA: Insufficient documentation

## 2017-07-16 DIAGNOSIS — Z8249 Family history of ischemic heart disease and other diseases of the circulatory system: Secondary | ICD-10-CM | POA: Insufficient documentation

## 2017-07-16 DIAGNOSIS — M1990 Unspecified osteoarthritis, unspecified site: Secondary | ICD-10-CM | POA: Diagnosis not present

## 2017-07-16 DIAGNOSIS — Z9842 Cataract extraction status, left eye: Secondary | ICD-10-CM | POA: Diagnosis not present

## 2017-07-16 DIAGNOSIS — Z7982 Long term (current) use of aspirin: Secondary | ICD-10-CM | POA: Diagnosis not present

## 2017-07-16 DIAGNOSIS — Z9049 Acquired absence of other specified parts of digestive tract: Secondary | ICD-10-CM | POA: Insufficient documentation

## 2017-07-16 DIAGNOSIS — R51 Headache: Secondary | ICD-10-CM | POA: Diagnosis not present

## 2017-07-16 DIAGNOSIS — G4089 Other seizures: Secondary | ICD-10-CM | POA: Diagnosis not present

## 2017-07-16 DIAGNOSIS — Z823 Family history of stroke: Secondary | ICD-10-CM | POA: Insufficient documentation

## 2017-07-16 DIAGNOSIS — Z9889 Other specified postprocedural states: Secondary | ICD-10-CM | POA: Insufficient documentation

## 2017-07-16 DIAGNOSIS — E785 Hyperlipidemia, unspecified: Secondary | ICD-10-CM | POA: Diagnosis not present

## 2017-07-16 DIAGNOSIS — Z9841 Cataract extraction status, right eye: Secondary | ICD-10-CM | POA: Diagnosis not present

## 2017-07-16 DIAGNOSIS — N185 Chronic kidney disease, stage 5: Secondary | ICD-10-CM | POA: Diagnosis not present

## 2017-07-16 HISTORY — DX: Prediabetes: R73.03

## 2017-07-16 HISTORY — PX: REVISON OF ARTERIOVENOUS FISTULA: SHX6074

## 2017-07-16 LAB — POCT I-STAT 4, (NA,K, GLUC, HGB,HCT)
Glucose, Bld: 78 mg/dL (ref 65–99)
HCT: 35 % — ABNORMAL LOW (ref 39.0–52.0)
Hemoglobin: 11.9 g/dL — ABNORMAL LOW (ref 13.0–17.0)
Potassium: 4.5 mmol/L (ref 3.5–5.1)
Sodium: 138 mmol/L (ref 135–145)

## 2017-07-16 LAB — NO BLOOD PRODUCTS

## 2017-07-16 SURGERY — REVISON OF ARTERIOVENOUS FISTULA
Anesthesia: General | Site: Arm Lower | Laterality: Right

## 2017-07-16 MED ORDER — ONDANSETRON HCL 4 MG/2ML IJ SOLN: 4.0000 mg | Freq: Once | INTRAMUSCULAR | Status: DC | PRN

## 2017-07-16 MED ORDER — SODIUM CHLORIDE 0.9 % IV SOLN
INTRAVENOUS | Status: AC
  Filled 2017-07-16: qty 1.2

## 2017-07-16 MED ORDER — TRAMADOL HCL 50 MG PO TABS: 50.0000 mg | ORAL_TABLET | Freq: Four times a day (QID) | ORAL | 0 refills | Status: DC | PRN

## 2017-07-16 MED ORDER — DEXAMETHASONE SODIUM PHOSPHATE 10 MG/ML IJ SOLN
INTRAMUSCULAR | Status: DC | PRN
Start: 2017-07-16 — End: 2017-07-16
  Administered 2017-07-16: 5 mg via INTRAVENOUS

## 2017-07-16 MED ORDER — FENTANYL CITRATE (PF) 100 MCG/2ML IJ SOLN: 25.0000 ug | INTRAMUSCULAR | Status: DC | PRN

## 2017-07-16 MED ORDER — CEFAZOLIN SODIUM-DEXTROSE 2-4 GM/100ML-% IV SOLN
2.0000 g | INTRAVENOUS | Status: AC
  Administered 2017-07-16: 2 g via INTRAVENOUS

## 2017-07-16 MED ORDER — MIDAZOLAM HCL 5 MG/5ML IJ SOLN
INTRAMUSCULAR | Status: DC | PRN
  Administered 2017-07-16: 2 mg via INTRAVENOUS

## 2017-07-16 MED ORDER — SODIUM CHLORIDE 0.9 % IV SOLN
INTRAVENOUS | Status: DC
  Administered 2017-07-16 (×2): via INTRAVENOUS

## 2017-07-16 MED ORDER — 0.9 % SODIUM CHLORIDE (POUR BTL) OPTIME
TOPICAL | Status: DC | PRN
  Administered 2017-07-16: 1000 mL

## 2017-07-16 MED ORDER — ONDANSETRON HCL 4 MG/2ML IJ SOLN
INTRAMUSCULAR | Status: AC
Start: 2017-07-16 — End: ?
  Filled 2017-07-16: qty 2

## 2017-07-16 MED ORDER — FENTANYL CITRATE (PF) 250 MCG/5ML IJ SOLN
INTRAMUSCULAR | Status: AC
  Filled 2017-07-16: qty 5

## 2017-07-16 MED ORDER — FENTANYL CITRATE (PF) 100 MCG/2ML IJ SOLN
25.0000 ug | INTRAMUSCULAR | Status: DC | PRN
  Administered 2017-07-16 (×2): 50 ug via INTRAVENOUS

## 2017-07-16 MED ORDER — LIDOCAINE-EPINEPHRINE (PF) 1 %-1:200000 IJ SOLN
INTRAMUSCULAR | Status: AC
  Filled 2017-07-16: qty 30

## 2017-07-16 MED ORDER — ONDANSETRON HCL 4 MG/2ML IJ SOLN
INTRAMUSCULAR | Status: DC | PRN
  Administered 2017-07-16: 4 mg via INTRAVENOUS

## 2017-07-16 MED ORDER — PROPOFOL 10 MG/ML IV BOLUS
INTRAVENOUS | Status: DC | PRN
  Administered 2017-07-16: 190 mg via INTRAVENOUS

## 2017-07-16 MED ORDER — PROPOFOL 10 MG/ML IV BOLUS
INTRAVENOUS | Status: AC
  Filled 2017-07-16: qty 20

## 2017-07-16 MED ORDER — LIDOCAINE 2% (20 MG/ML) 5 ML SYRINGE
INTRAMUSCULAR | Status: AC
  Filled 2017-07-16: qty 5

## 2017-07-16 MED ORDER — LIDOCAINE 2% (20 MG/ML) 5 ML SYRINGE
INTRAMUSCULAR | Status: DC | PRN
  Administered 2017-07-16: 40 mg via INTRAVENOUS

## 2017-07-16 MED ORDER — FENTANYL CITRATE (PF) 100 MCG/2ML IJ SOLN
INTRAMUSCULAR | Status: DC | PRN
  Administered 2017-07-16: 100 ug via INTRAVENOUS
  Administered 2017-07-16: 50 ug via INTRAVENOUS

## 2017-07-16 MED ORDER — DEXAMETHASONE SODIUM PHOSPHATE 10 MG/ML IJ SOLN
INTRAMUSCULAR | Status: AC
  Filled 2017-07-16: qty 1

## 2017-07-16 MED ORDER — MIDAZOLAM HCL 2 MG/2ML IJ SOLN
INTRAMUSCULAR | Status: AC
  Filled 2017-07-16: qty 2

## 2017-07-16 MED ORDER — CEFAZOLIN SODIUM-DEXTROSE 2-4 GM/100ML-% IV SOLN
INTRAVENOUS | Status: AC
  Filled 2017-07-16: qty 100

## 2017-07-16 MED ORDER — FENTANYL CITRATE (PF) 100 MCG/2ML IJ SOLN
INTRAMUSCULAR | Status: AC
  Filled 2017-07-16: qty 2

## 2017-07-16 MED ORDER — SODIUM CHLORIDE 0.9 % IV SOLN
INTRAVENOUS | Status: DC | PRN
  Administered 2017-07-16: 12:00:00

## 2017-07-16 SURGICAL SUPPLY — 37 items
ADH SKN CLS APL DERMABOND .7 (GAUZE/BANDAGES/DRESSINGS) ×1
ARMBAND PINK RESTRICT EXTREMIT (MISCELLANEOUS) ×2 IMPLANT
CANISTER SUCT 3000ML PPV (MISCELLANEOUS) ×2 IMPLANT
CLIP VESOCCLUDE MED 6/CT (CLIP) ×2 IMPLANT
CLIP VESOCCLUDE SM WIDE 6/CT (CLIP) ×2 IMPLANT
COVER PROBE W GEL 5X96 (DRAPES) ×2 IMPLANT
DERMABOND ADVANCED (GAUZE/BANDAGES/DRESSINGS) ×1
DERMABOND ADVANCED .7 DNX12 (GAUZE/BANDAGES/DRESSINGS) ×1 IMPLANT
ELECT REM PT RETURN 9FT ADLT (ELECTROSURGICAL) ×2
ELECTRODE REM PT RTRN 9FT ADLT (ELECTROSURGICAL) ×1 IMPLANT
GLOVE BIO SURGEON STRL SZ 6.5 (GLOVE) ×2 IMPLANT
GLOVE BIO SURGEON STRL SZ7.5 (GLOVE) ×2 IMPLANT
GLOVE BIOGEL PI IND STRL 6.5 (GLOVE) IMPLANT
GLOVE BIOGEL PI IND STRL 7.0 (GLOVE) IMPLANT
GLOVE BIOGEL PI INDICATOR 6.5 (GLOVE) ×2
GLOVE BIOGEL PI INDICATOR 7.0 (GLOVE) ×1
GLOVE ECLIPSE 6.5 STRL STRAW (GLOVE) ×1 IMPLANT
GOWN STRL REUS W/ TWL LRG LVL3 (GOWN DISPOSABLE) ×2 IMPLANT
GOWN STRL REUS W/ TWL XL LVL3 (GOWN DISPOSABLE) ×1 IMPLANT
GOWN STRL REUS W/TWL LRG LVL3 (GOWN DISPOSABLE) ×4
GOWN STRL REUS W/TWL XL LVL3 (GOWN DISPOSABLE) ×6
KIT BASIN OR (CUSTOM PROCEDURE TRAY) ×2 IMPLANT
KIT TURNOVER KIT B (KITS) ×2 IMPLANT
NS IRRIG 1000ML POUR BTL (IV SOLUTION) ×2 IMPLANT
PACK CV ACCESS (CUSTOM PROCEDURE TRAY) ×2 IMPLANT
PAD ARMBOARD 7.5X6 YLW CONV (MISCELLANEOUS) ×4 IMPLANT
SUT MNCRL AB 4-0 PS2 18 (SUTURE) ×2 IMPLANT
SUT PROLENE 4 0 RB 1 (SUTURE) ×4
SUT PROLENE 4-0 RB1 18X2 ARM (SUTURE) IMPLANT
SUT PROLENE 5 0 C 1 24 (SUTURE) ×1 IMPLANT
SUT PROLENE 5 0 C 1 36 (SUTURE) ×1 IMPLANT
SUT PROLENE 6 0 BV (SUTURE) ×2 IMPLANT
SUT VIC AB 3-0 SH 27 (SUTURE) ×2
SUT VIC AB 3-0 SH 27X BRD (SUTURE) ×1 IMPLANT
TOWEL GREEN STERILE (TOWEL DISPOSABLE) ×2 IMPLANT
UNDERPAD 30X30 (UNDERPADS AND DIAPERS) ×2 IMPLANT
WATER STERILE IRR 1000ML POUR (IV SOLUTION) ×2 IMPLANT

## 2017-07-16 NOTE — Telephone Encounter (Signed)
sch appt lvm 08/16/17 2pm p/o PA

## 2017-07-16 NOTE — Anesthesia Postprocedure Evaluation (Signed)
Anesthesia Post Note  Patient: Kairi Tufo Sr.  Procedure(s) Performed: REVISION OF ARTERIOVENOUS FISTULA  Right ARM (Right Arm Lower)     Patient location during evaluation: PACU Anesthesia Type: General Level of consciousness: awake and alert Pain management: pain level controlled Vital Signs Assessment: post-procedure vital signs reviewed and stable Respiratory status: spontaneous breathing, nonlabored ventilation, respiratory function stable and patient connected to nasal cannula oxygen Cardiovascular status: blood pressure returned to baseline and stable Postop Assessment: no apparent nausea or vomiting Anesthetic complications: no    Last Vitals:  Vitals:   07/16/17 1315 07/16/17 1325  BP:  (!) 126/91  Pulse: 85 83  Resp: 14 12  Temp:  36.5 C  SpO2: 100% 100%    Last Pain:  Vitals:   07/16/17 1300  TempSrc:   PainSc: 9                  Jamicheal Heard COKER

## 2017-07-16 NOTE — Anesthesia Preprocedure Evaluation (Signed)
Anesthesia Evaluation  Patient identified by MRN, date of birth, ID band Patient awake    Reviewed: Allergy & Precautions, NPO status , Patient's Chart, lab work & pertinent test results  Airway Mallampati: II  TM Distance: >3 FB Neck ROM: Full    Dental  (+) Teeth Intact, Dental Advisory Given   Pulmonary former smoker,    breath sounds clear to auscultation       Cardiovascular hypertension,  Rhythm:Regular Rate:Normal     Neuro/Psych    GI/Hepatic   Endo/Other    Renal/GU      Musculoskeletal   Abdominal   Peds  Hematology   Anesthesia Other Findings   Reproductive/Obstetrics                             Anesthesia Physical Anesthesia Plan  ASA: III  Anesthesia Plan:    Post-op Pain Management:    Induction: Intravenous  PONV Risk Score and Plan: Ondansetron and Dexamethasone  Airway Management Planned: LMA  Additional Equipment:   Intra-op Plan:   Post-operative Plan:   Informed Consent: I have reviewed the patients History and Physical, chart, labs and discussed the procedure including the risks, benefits and alternatives for the proposed anesthesia with the patient or authorized representative who has indicated his/her understanding and acceptance.   Dental advisory given  Plan Discussed with: CRNA and Anesthesiologist  Anesthesia Plan Comments:         Anesthesia Quick Evaluation

## 2017-07-16 NOTE — Discharge Instructions (Signed)
AV Fistula Placement, Care After Refer to this sheet in the next few weeks. These instructions provide you with information about caring for yourself after your procedure. Your health care provider may also give you more specific instructions. Your treatment has been planned according to current medical practices, but problems sometimes occur. Call your health care provider if you have any problems or questions after your procedure. What can I expect after the procedure? After your procedure, it is common to:  Feel sore.  Have numbness.  Feel cold.  Feel a vibration (thrill) over the fistula.  Follow these instructions at home:  Incision care  Do not take baths or showers, swim, or use a hot tub until your health care provider approves.  Keep the area around your cut from surgery (incision) clean and dry.  Follow instructions from your health care provider about how to take care of your incision. Make sure you: ? Wash your hands with soap and water before you change your bandage (dressing). If soap and water are not available, use hand sanitizer. ? Change your dressing as told by your health care provider. ? Leave stitches (sutures) and clips in place. They may need to stay in place for 2 weeks or longer. Fistula Care  Check your fistula site every day to make sure the thrill feels the same.  Check your fistula site every day for signs of infection. Watch for: ? Redness. ? Swelling. ? Discharge. ? Tenderness. ? Enlargement.  Keep your arm raised (elevated) while you rest.  Do not lift anything that is heavier than a gallon of milk with the arm that has the fistula.  Do not lie down on your fistula arm.  Do not let anyone draw blood or take a blood pressure reading on your fistula arm.  Do not wear tight jewelry or clothing over your fistula arm. General instructions  Rest at home for a day or two.  Gradually start doing your usual activities again. Ask your  surgeon when you can return to work or school.  Take over-the-counter and prescription medicines only as told by your health care provider.  Keep all follow-up visits as told by your health care provider. This is important. Contact a health care provider if:  You have chills or a fever.  You have pain at your fistula site that is not going away.  You have numbness or coldness at your fistula site that is not going away.  You feel a decrease or a change in the thrill.  You have swelling in your arm or hand.  You have redness, swelling, discharge, tenderness, or enlargement at your fistula site. Get help right away if:  You are bleeding from your fistula site.  You have chest pain.  You have trouble breathing. This information is not intended to replace advice given to you by your health care provider. Make sure you discuss any questions you have with your health care provider. Document Released: 01/29/2005 Document Revised: 06/08/2015 Document Reviewed: 04/21/2014 Elsevier Interactive Patient Education  2018 [employer].    [facility]  Discharge Instructions  AV Fistula or Graft Surgery for Dialysis Access  Please refer to the following instructions for your post-procedure care. Your surgeon or physician assistant will discuss any changes with you.  Activity  You may drive the day following your surgery, if you are comfortable and no longer taking prescription pain medication. Resume full activity as the soreness in your incision resolves.  Bathing/Showering  You may shower after you go home. Keep your incision dry for 48 hours. Do not soak in a bathtub, hot tub, or swim until the incision heals completely. You may not shower if you have a hemodialysis catheter.  Incision Care  Clean your incision with mild soap and water after 48 hours. Pat the area dry with a clean towel. You do not need a bandage unless otherwise instructed. Do not  apply any ointments or creams to your incision. You may have skin glue on your incision. Do not peel it off. It will come off on its own in about one week. Your arm may swell a bit after surgery. To reduce swelling use pillows to elevate your arm so it is above your heart. Your doctor will tell you if you need to lightly wrap your arm with an ACE bandage.  Diet  Resume your normal diet. There are not special food restrictions following this procedure. In order to heal from your surgery, it is CRITICAL to get adequate nutrition. Your body requires vitamins, minerals, and protein. Vegetables are the best source of vitamins and minerals. Vegetables also provide the perfect balance of protein. Processed food has little nutritional value, so try to avoid this.  Medications  Resume taking all of your medications. If your incision is causing pain, you may take over-the counter pain relievers such as acetaminophen (Tylenol). If you were prescribed a stronger pain medication, please be aware these medications can cause nausea and constipation. Prevent nausea by taking the medication with a snack or meal. Avoid constipation by drinking plenty of fluids and eating foods with high amount of fiber, such as fruits, vegetables, and grains.  Do not take Tylenol if you are taking prescription pain medications.  Follow up Your surgeon may want to see you in the office following your access surgery. If so, this will be arranged at the time of your surgery.  Please call us immediately for any of the following conditions:  Increased pain, redness, drainage (pus) from your incision site Fever of 101 degrees or higher Severe or worsening pain at your incision site Hand pain or numbness.  Reduce your risk of vascular disease:  Stop smoking. If you would like help, call [facility] at 1-800-QUIT-NOW 701-780-8197) or [place] at [place] your cholesterol Maintain a desired weight Control your  diabetes Keep your blood pressure down  Dialysis  It will take several weeks to several months for your new dialysis access to be ready for use. Your surgeon will determine when it is okay to use it. Your nephrologist will continue to direct your dialysis. You can continue to use your Permcath until your new access is ready for use.   07/16/2017 Carl Diesel Sr. [id number] 1960/12/01  Surgeon(s): Waynetta Sandy, MD  Procedure(s): REVISION OF ARTERIOVENOUS FISTULA  Right ARM  x May stick graft on designated area only:  Do NOT stick fistula over incision x 6 weeks. May stick above and below incision now. SEE DIAGRAM.   If you have any questions, please call the office at [phone].   Post Anesthesia Home Care Instructions  Activity: Get plenty of rest for the remainder of the day. A responsible individual must stay with you for 24 hours following the procedure.  For the next 24 hours, DO NOT: -Drive a car -Paediatric nurse -Drink alcoholic beverages -Take any medication unless instructed by your physician -Make any legal decisions or sign important papers.  Meals: Start with liquid foods  such as gelatin or soup. Progress to regular foods as tolerated. Avoid greasy, spicy, heavy foods. If nausea and/or vomiting occur, drink only clear liquids until the nausea and/or vomiting subsides. Call your physician if vomiting continues.  Special Instructions/Symptoms: Your throat may feel dry or sore from the anesthesia or the breathing tube placed in your throat during surgery. If this causes discomfort, gargle with warm salt water. The discomfort should disappear within 24 hours.  If you had a scopolamine patch placed behind your ear for the management of post- operative nausea and/or vomiting:  1. The medication in the patch is effective for 72 hours, after which it should be removed.  Wrap patch in a tissue and discard in the trash. Wash hands thoroughly with soap  and water. 2. You may remove the patch earlier than 72 hours if you experience unpleasant side effects which may include dry mouth, dizziness or visual disturbances. 3. Avoid touching the patch. Wash your hands with soap and water after contact with the patch.

## 2017-07-16 NOTE — Anesthesia Procedure Notes (Signed)
Procedure Name: LMA Insertion Date/Time: 07/16/2017 11:23 AM Performed by: Moshe Salisbury, CRNA Pre-anesthesia Checklist: Patient identified, Emergency Drugs available, Suction available and Patient being monitored Patient Re-evaluated:Patient Re-evaluated prior to induction Oxygen Delivery Method: Circle System Utilized Preoxygenation: Pre-oxygenation with 100% oxygen Induction Type: IV induction Ventilation: Mask ventilation without difficulty LMA: LMA inserted LMA Size: 5.0 Number of attempts: 1 Placement Confirmation: positive ETCO2 Tube secured with: Tape Dental Injury: Teeth and Oropharynx as per pre-operative assessment

## 2017-07-16 NOTE — Transfer of Care (Signed)
Immediate Anesthesia Transfer of Care Note  Patient: Carl Kneisel Sr.  Procedure(s) Performed: REVISION OF ARTERIOVENOUS FISTULA  Right ARM (Right Arm Lower)  Patient Location: PACU  Anesthesia Type:General  Level of Consciousness: drowsy and patient cooperative  Airway & Oxygen Therapy: Patient Spontanous Breathing and Patient connected to nasal cannula oxygen  Post-op Assessment: Report given to RN, Post -op Vital signs reviewed and stable and Patient moving all extremities  Post vital signs: Reviewed and stable  Last Vitals:  Vitals Value Taken Time  BP 139/93 07/16/2017 12:40 PM  Temp    Pulse 85 07/16/2017 12:42 PM  Resp 15 07/16/2017 12:42 PM  SpO2 100 % 07/16/2017 12:42 PM  Vitals shown include unvalidated device data.  Last Pain:  Vitals:   07/16/17 1005  TempSrc:   PainSc: 6       Patients Stated Pain Goal: 3 (91/50/41 3643)  Complications: No apparent anesthesia complications

## 2017-07-16 NOTE — H&P (Signed)
   History and Physical Update  The patient was interviewed and re-examined.  The patient's previous History and Physical has been reviewed and is unchanged from recent office visit. Plan for right arm avf revision with plication.   [name] C. Donzetta Matters, MD [facility] [place] Office: [phone] Pager: [phone]  07/16/2017, 9:33 AM

## 2017-07-16 NOTE — Op Note (Signed)
    Patient name: Carl Solis Sr. MRN: [id number] DOB: December 30, 1960 Sex: male  07/16/2017 Pre-operative Diagnosis: End-stage renal disease Post-operative diagnosis:  Same Surgeon:  Erlene Quan C. Donzetta Matters, MD Assistant Leontine Locket, [place] Procedure Performed:  Revision of right arm AV fistula with plication of 2 pseudoaneurysms  Indications: 57 year old male with history end-stage renal disease on dialysis via right arm AV fistula.  Has had 2 areas of pseudoaneurysm that have thrombus within. He is now indicated for revision of his 2 pseudoaneurysms on his fistula.  Findings: 2 pseudoaneurysm sites were thrombus laden.  These were plicated.  Skin was excised at completion there is a strong thrill throughout the fistula.   Procedure:  The patient was identified in the holding area and taken to the operating room where is placed supine on the operative table and general anesthesia was induced.  He was sterilely prepped and draped in the right arm the usual fashion given antibiotics and timeout was called.  Made a longitudinal incision along both aspects of the pseudoaneurysms.  We dissected down initially the fascia was injured this was repaired with 5-0 figure-of-eight suture.  We then got proximal and distal control on either side of the 2 aneurysms.  We then opened the most cephalad pseudoaneurysm remove thrombus from the sides and suture repaired it with 4-0 Prolene suture in a running mattress configuration.  We then similarly open to the other pseudoaneurysm separately as there was a healthy-appearing fistula bridge between them.  Similarly there was thrombus that was removed.  We trimmed the sides back to healthy fistula close them again with a 4-0 Prolene suture in a running mattress fashion.  There was then a strong thrill on the other side of the repairs and this was confirmed with Doppler.  We then removed excess skin closed it with a running 3-0 Vicryl suture followed by subcutaneous 4-0 Monocryl.   Dermabond was placed to level the skin.  He was allowed away from anesthesia having tolerated procedure without immediate complication.  EBL 50 cc.   Mignon Bechler C. Donzetta Matters, MD [facility] [place] Office: [phone] Pager: [phone]

## 2017-07-17 ENCOUNTER — Encounter (HOSPITAL_COMMUNITY): Payer: Self-pay | Admitting: Vascular Surgery

## 2017-07-17 DIAGNOSIS — N2581 Secondary hyperparathyroidism of renal origin: Secondary | ICD-10-CM | POA: Diagnosis not present

## 2017-07-17 DIAGNOSIS — E875 Hyperkalemia: Secondary | ICD-10-CM | POA: Diagnosis not present

## 2017-07-17 DIAGNOSIS — N186 End stage renal disease: Secondary | ICD-10-CM | POA: Diagnosis not present

## 2017-07-17 DIAGNOSIS — D631 Anemia in chronic kidney disease: Secondary | ICD-10-CM | POA: Diagnosis not present

## 2017-07-18 ENCOUNTER — Ambulatory Visit (INDEPENDENT_AMBULATORY_CARE_PROVIDER_SITE_OTHER): Payer: Self-pay | Admitting: Physician Assistant

## 2017-07-18 VITALS — BP 128/78 | HR 91 | Temp 97.9°F | Resp 20 | Ht 75.0 in | Wt 196.9 lb

## 2017-07-18 DIAGNOSIS — I12 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 stage renal disease: Secondary | ICD-10-CM | POA: Diagnosis not present

## 2017-07-18 DIAGNOSIS — Z992 Dependence on renal dialysis: Secondary | ICD-10-CM | POA: Diagnosis not present

## 2017-07-18 DIAGNOSIS — T82510S Breakdown (mechanical) of surgically created arteriovenous fistula, sequela: Secondary | ICD-10-CM | POA: Diagnosis not present

## 2017-07-18 DIAGNOSIS — N186 End stage renal disease: Secondary | ICD-10-CM

## 2017-07-18 NOTE — Progress Notes (Deleted)
  Subjective:     Patient ID: Carl Odriscoll Sr., male   DOB: 1960/05/22, 57 y.o.   MRN: [id number]  HPI   Review of Systems     Objective:   Physical Exam     Assessment:     ***    Plan:     ***

## 2017-07-18 NOTE — Progress Notes (Signed)
    Postoperative Access Visit   History of Present Illness   Carl Jacinto Sr. is a 57 y.o. year old male who presents for postoperative follow-up for: right pliaction of fistula pseudoaneurysms x2 by Dr. Donzetta Matters (Date: 07/16/17).  The patient reports to clinic to check incision of right arm.  Home health nurse and the patient are concerned about the warmth feeling around the incision of his arm.  Patient was also concerned about minimal oozing from incision on postop day #1.  Patient states the bruising has since stopped.  Patient thinks his arm is infected and would like to be prescribed an antibiotic before it gets too serious.  He denies any fevers, chills, nausea/vomiting currently.  He is dialyzing on a Monday Wednesday Friday schedule without complication from fistula near his wrist and away from areas of plication.  Physical Examination   Vitals:   07/16/17 1300 07/16/17 1310 07/16/17 1315 07/16/17 1325  BP:  119/87  (!) 126/91  Pulse: 83 88 85 83  Resp: 14 12 14 12   Temp:    97.7 F (36.5 C)  TempSrc:      SpO2: 100% 100% 100% 100%  Weight:      Height:       Body mass index is 25 kg/m.  right arm Incision is clean, dry, and intact with Dermabond approximating wound edges well, palpable thrill and audible bruit throughout the length of the fistula in his right forearm; no drainage with manipulation of incision; no obvious signs of infection at this time     Medical Decision Making   Carl Ellenwood Sr. is a 57 y.o. year old male who presents POD #2 after plication of fistula pseudoaneurysms x2 by Dr. Donzetta Matters   Patent right radiocephalic fistula with palpable thrill and audible bruit throughout  Patient was reassured that the incision looks as it should only 2 days after surgery and it is highly unlikely that his incision is infected at this time  He will continue hemodialysis from his fistula away from the area of plication  He will follow-up as scheduled in July to  evaluate readiness of areas of plication to be cannulated during hemodialysis  I also explained to the patient that he may call the office at any time if he feels like the appearance of his surgical site is worsening or if he is worried about infection   Dagoberto Ligas, PA-C [facility] Office: [phone]

## 2017-07-18 NOTE — Progress Notes (Signed)
    Postoperative Access Visit   History of Present Illness   Carl Gwinner Sr. is a 57 y.o. year old male who presents for postoperative follow-up for evaluation of surgical incision postoperative day #2 status post plication of fistula pseudoaneurysm x2 by Dr. Donzetta Matters.  The patient and home health nurse were concerned about a warm feeling near the surgical incision.  Patient was also concerned about minimal oozing postoperative day #1 which has since resolved.  Patient states that he is concerned his incision is infected and would like an antibiotic before it gets worse.  He denies any fevers, chills, nausea/vomiting.  He is dialyzing on a Monday Wednesday Friday schedule from R arm radiocephalic fistula away from recently plicated area.  Physical Examination   Vitals:   07/18/17 1309  BP: 128/78  Pulse: 91  Resp: 20  Temp: 97.9 F (36.6 C)  TempSrc: Oral  SpO2: 98%  Weight: 196 lb 14.4 oz (89.3 kg)  Height: 6\' 3"  (1.905 m)   Body mass index is 24.61 kg/m.  right arm  incision is clean, dry, and intact with wound edges approximated well with Dermabond; palpable thrill and audible bruit throughout length of fistula in right forearm; grip strength intact right hand; no sign of obvious infection surgical incision    Medical Decision Making   Carl Puglia Sr. is a 57 y.o. year old male who presents on postoperative day #2 status post plication of fistula pseudoaneurysms x2 with concern of surgical incision infection   Right radiocephalic fistula is patent with palpable thrill and audible bruit throughout length of fistula  Surgical incision of right arm is unremarkable at this time  Patient was assured this was a normal appearance of surgical site postoperatively and that that there is nothing to indicate an aggressive infection at this time  He will follow-up as scheduled next month to assess readiness for cannulation of plicated areas of fistula for hemodialysis  He may  notify the office of any further concerns about his surgical site before that time   Dagoberto Ligas, PA-C [facility] Office: [phone]

## 2017-07-19 DIAGNOSIS — N186 End stage renal disease: Secondary | ICD-10-CM | POA: Diagnosis not present

## 2017-07-19 DIAGNOSIS — D631 Anemia in chronic kidney disease: Secondary | ICD-10-CM | POA: Diagnosis not present

## 2017-07-19 DIAGNOSIS — N2581 Secondary hyperparathyroidism of renal origin: Secondary | ICD-10-CM | POA: Diagnosis not present

## 2017-07-19 DIAGNOSIS — E875 Hyperkalemia: Secondary | ICD-10-CM | POA: Diagnosis not present

## 2017-07-20 DIAGNOSIS — N186 End stage renal disease: Secondary | ICD-10-CM | POA: Diagnosis not present

## 2017-07-20 DIAGNOSIS — T82510S Breakdown (mechanical) of surgically created arteriovenous fistula, sequela: Secondary | ICD-10-CM | POA: Diagnosis not present

## 2017-07-20 DIAGNOSIS — Z992 Dependence on renal dialysis: Secondary | ICD-10-CM | POA: Diagnosis not present

## 2017-07-20 DIAGNOSIS — I12 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 stage renal disease: Secondary | ICD-10-CM | POA: Diagnosis not present

## 2017-07-22 DIAGNOSIS — E875 Hyperkalemia: Secondary | ICD-10-CM | POA: Diagnosis not present

## 2017-07-22 DIAGNOSIS — N2581 Secondary hyperparathyroidism of renal origin: Secondary | ICD-10-CM | POA: Diagnosis not present

## 2017-07-22 DIAGNOSIS — Z992 Dependence on renal dialysis: Secondary | ICD-10-CM | POA: Diagnosis not present

## 2017-07-22 DIAGNOSIS — T82510S Breakdown (mechanical) of surgically created arteriovenous fistula, sequela: Secondary | ICD-10-CM | POA: Diagnosis not present

## 2017-07-22 DIAGNOSIS — I12 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 stage renal disease: Secondary | ICD-10-CM | POA: Diagnosis not present

## 2017-07-22 DIAGNOSIS — D631 Anemia in chronic kidney disease: Secondary | ICD-10-CM | POA: Diagnosis not present

## 2017-07-22 DIAGNOSIS — N186 End stage renal disease: Secondary | ICD-10-CM | POA: Diagnosis not present

## 2017-07-24 DIAGNOSIS — E875 Hyperkalemia: Secondary | ICD-10-CM | POA: Diagnosis not present

## 2017-07-24 DIAGNOSIS — D631 Anemia in chronic kidney disease: Secondary | ICD-10-CM | POA: Diagnosis not present

## 2017-07-24 DIAGNOSIS — N2581 Secondary hyperparathyroidism of renal origin: Secondary | ICD-10-CM | POA: Diagnosis not present

## 2017-07-24 DIAGNOSIS — N186 End stage renal disease: Secondary | ICD-10-CM | POA: Diagnosis not present

## 2017-07-26 DIAGNOSIS — D631 Anemia in chronic kidney disease: Secondary | ICD-10-CM | POA: Diagnosis not present

## 2017-07-26 DIAGNOSIS — T82510S Breakdown (mechanical) of surgically created arteriovenous fistula, sequela: Secondary | ICD-10-CM | POA: Diagnosis not present

## 2017-07-26 DIAGNOSIS — E875 Hyperkalemia: Secondary | ICD-10-CM | POA: Diagnosis not present

## 2017-07-26 DIAGNOSIS — N2581 Secondary hyperparathyroidism of renal origin: Secondary | ICD-10-CM | POA: Diagnosis not present

## 2017-07-26 DIAGNOSIS — Z992 Dependence on renal dialysis: Secondary | ICD-10-CM | POA: Diagnosis not present

## 2017-07-26 DIAGNOSIS — N186 End stage renal disease: Secondary | ICD-10-CM | POA: Diagnosis not present

## 2017-07-26 DIAGNOSIS — I12 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 stage renal disease: Secondary | ICD-10-CM | POA: Diagnosis not present

## 2017-07-29 DIAGNOSIS — D631 Anemia in chronic kidney disease: Secondary | ICD-10-CM | POA: Diagnosis not present

## 2017-07-29 DIAGNOSIS — E875 Hyperkalemia: Secondary | ICD-10-CM | POA: Diagnosis not present

## 2017-07-29 DIAGNOSIS — N2581 Secondary hyperparathyroidism of renal origin: Secondary | ICD-10-CM | POA: Diagnosis not present

## 2017-07-29 DIAGNOSIS — N186 End stage renal disease: Secondary | ICD-10-CM | POA: Diagnosis not present

## 2017-07-30 DIAGNOSIS — I12 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 stage renal disease: Secondary | ICD-10-CM | POA: Diagnosis not present

## 2017-07-30 DIAGNOSIS — N186 End stage renal disease: Secondary | ICD-10-CM | POA: Diagnosis not present

## 2017-07-30 DIAGNOSIS — Z992 Dependence on renal dialysis: Secondary | ICD-10-CM | POA: Diagnosis not present

## 2017-07-30 DIAGNOSIS — T82510S Breakdown (mechanical) of surgically created arteriovenous fistula, sequela: Secondary | ICD-10-CM | POA: Diagnosis not present

## 2017-07-31 DIAGNOSIS — D631 Anemia in chronic kidney disease: Secondary | ICD-10-CM | POA: Diagnosis not present

## 2017-07-31 DIAGNOSIS — E875 Hyperkalemia: Secondary | ICD-10-CM | POA: Diagnosis not present

## 2017-07-31 DIAGNOSIS — N186 End stage renal disease: Secondary | ICD-10-CM | POA: Diagnosis not present

## 2017-07-31 DIAGNOSIS — N2581 Secondary hyperparathyroidism of renal origin: Secondary | ICD-10-CM | POA: Diagnosis not present

## 2017-08-01 DIAGNOSIS — N186 End stage renal disease: Secondary | ICD-10-CM | POA: Diagnosis not present

## 2017-08-01 DIAGNOSIS — Z992 Dependence on renal dialysis: Secondary | ICD-10-CM | POA: Diagnosis not present

## 2017-08-01 DIAGNOSIS — T82510S Breakdown (mechanical) of surgically created arteriovenous fistula, sequela: Secondary | ICD-10-CM | POA: Diagnosis not present

## 2017-08-01 DIAGNOSIS — I12 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 stage renal disease: Secondary | ICD-10-CM | POA: Diagnosis not present

## 2017-08-02 DIAGNOSIS — E875 Hyperkalemia: Secondary | ICD-10-CM | POA: Diagnosis not present

## 2017-08-02 DIAGNOSIS — D631 Anemia in chronic kidney disease: Secondary | ICD-10-CM | POA: Diagnosis not present

## 2017-08-02 DIAGNOSIS — N186 End stage renal disease: Secondary | ICD-10-CM | POA: Diagnosis not present

## 2017-08-02 DIAGNOSIS — N2581 Secondary hyperparathyroidism of renal origin: Secondary | ICD-10-CM | POA: Diagnosis not present

## 2017-08-05 DIAGNOSIS — D631 Anemia in chronic kidney disease: Secondary | ICD-10-CM | POA: Diagnosis not present

## 2017-08-05 DIAGNOSIS — N186 End stage renal disease: Secondary | ICD-10-CM | POA: Diagnosis not present

## 2017-08-05 DIAGNOSIS — E875 Hyperkalemia: Secondary | ICD-10-CM | POA: Diagnosis not present

## 2017-08-05 DIAGNOSIS — N2581 Secondary hyperparathyroidism of renal origin: Secondary | ICD-10-CM | POA: Diagnosis not present

## 2017-08-07 DIAGNOSIS — I12 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 stage renal disease: Secondary | ICD-10-CM | POA: Diagnosis not present

## 2017-08-07 DIAGNOSIS — Z992 Dependence on renal dialysis: Secondary | ICD-10-CM | POA: Diagnosis not present

## 2017-08-07 DIAGNOSIS — N186 End stage renal disease: Secondary | ICD-10-CM | POA: Diagnosis not present

## 2017-08-07 DIAGNOSIS — E875 Hyperkalemia: Secondary | ICD-10-CM | POA: Diagnosis not present

## 2017-08-07 DIAGNOSIS — D631 Anemia in chronic kidney disease: Secondary | ICD-10-CM | POA: Diagnosis not present

## 2017-08-07 DIAGNOSIS — T82510S Breakdown (mechanical) of surgically created arteriovenous fistula, sequela: Secondary | ICD-10-CM | POA: Diagnosis not present

## 2017-08-07 DIAGNOSIS — N2581 Secondary hyperparathyroidism of renal origin: Secondary | ICD-10-CM | POA: Diagnosis not present

## 2017-08-08 DIAGNOSIS — G63 Polyneuropathy in diseases classified elsewhere: Secondary | ICD-10-CM | POA: Diagnosis not present

## 2017-08-08 DIAGNOSIS — E7800 Pure hypercholesterolemia, unspecified: Secondary | ICD-10-CM | POA: Diagnosis not present

## 2017-08-08 DIAGNOSIS — Z992 Dependence on renal dialysis: Secondary | ICD-10-CM | POA: Diagnosis not present

## 2017-08-08 DIAGNOSIS — N186 End stage renal disease: Secondary | ICD-10-CM | POA: Diagnosis not present

## 2017-08-09 DIAGNOSIS — D631 Anemia in chronic kidney disease: Secondary | ICD-10-CM | POA: Diagnosis not present

## 2017-08-09 DIAGNOSIS — E875 Hyperkalemia: Secondary | ICD-10-CM | POA: Diagnosis not present

## 2017-08-09 DIAGNOSIS — N186 End stage renal disease: Secondary | ICD-10-CM | POA: Diagnosis not present

## 2017-08-09 DIAGNOSIS — N2581 Secondary hyperparathyroidism of renal origin: Secondary | ICD-10-CM | POA: Diagnosis not present

## 2017-08-12 DIAGNOSIS — Z992 Dependence on renal dialysis: Secondary | ICD-10-CM | POA: Diagnosis not present

## 2017-08-12 DIAGNOSIS — E875 Hyperkalemia: Secondary | ICD-10-CM | POA: Diagnosis not present

## 2017-08-12 DIAGNOSIS — D631 Anemia in chronic kidney disease: Secondary | ICD-10-CM | POA: Diagnosis not present

## 2017-08-12 DIAGNOSIS — T82510S Breakdown (mechanical) of surgically created arteriovenous fistula, sequela: Secondary | ICD-10-CM | POA: Diagnosis not present

## 2017-08-12 DIAGNOSIS — N186 End stage renal disease: Secondary | ICD-10-CM | POA: Diagnosis not present

## 2017-08-12 DIAGNOSIS — N2581 Secondary hyperparathyroidism of renal origin: Secondary | ICD-10-CM | POA: Diagnosis not present

## 2017-08-12 DIAGNOSIS — I12 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 stage renal disease: Secondary | ICD-10-CM | POA: Diagnosis not present

## 2017-08-14 DIAGNOSIS — N186 End stage renal disease: Secondary | ICD-10-CM | POA: Diagnosis not present

## 2017-08-14 DIAGNOSIS — D631 Anemia in chronic kidney disease: Secondary | ICD-10-CM | POA: Diagnosis not present

## 2017-08-14 DIAGNOSIS — E875 Hyperkalemia: Secondary | ICD-10-CM | POA: Diagnosis not present

## 2017-08-14 DIAGNOSIS — N2581 Secondary hyperparathyroidism of renal origin: Secondary | ICD-10-CM | POA: Diagnosis not present

## 2017-08-16 DIAGNOSIS — E875 Hyperkalemia: Secondary | ICD-10-CM | POA: Diagnosis not present

## 2017-08-16 DIAGNOSIS — D631 Anemia in chronic kidney disease: Secondary | ICD-10-CM | POA: Diagnosis not present

## 2017-08-16 DIAGNOSIS — N186 End stage renal disease: Secondary | ICD-10-CM | POA: Diagnosis not present

## 2017-08-16 DIAGNOSIS — N2581 Secondary hyperparathyroidism of renal origin: Secondary | ICD-10-CM | POA: Diagnosis not present

## 2017-08-19 DIAGNOSIS — N2581 Secondary hyperparathyroidism of renal origin: Secondary | ICD-10-CM | POA: Diagnosis not present

## 2017-08-19 DIAGNOSIS — N186 End stage renal disease: Secondary | ICD-10-CM | POA: Diagnosis not present

## 2017-08-19 DIAGNOSIS — E875 Hyperkalemia: Secondary | ICD-10-CM | POA: Diagnosis not present

## 2017-08-19 DIAGNOSIS — D631 Anemia in chronic kidney disease: Secondary | ICD-10-CM | POA: Diagnosis not present

## 2017-08-21 DIAGNOSIS — N2581 Secondary hyperparathyroidism of renal origin: Secondary | ICD-10-CM | POA: Diagnosis not present

## 2017-08-21 DIAGNOSIS — E875 Hyperkalemia: Secondary | ICD-10-CM | POA: Diagnosis not present

## 2017-08-21 DIAGNOSIS — N186 End stage renal disease: Secondary | ICD-10-CM | POA: Diagnosis not present

## 2017-08-21 DIAGNOSIS — D631 Anemia in chronic kidney disease: Secondary | ICD-10-CM | POA: Diagnosis not present

## 2017-08-23 DIAGNOSIS — D631 Anemia in chronic kidney disease: Secondary | ICD-10-CM | POA: Diagnosis not present

## 2017-08-23 DIAGNOSIS — E875 Hyperkalemia: Secondary | ICD-10-CM | POA: Diagnosis not present

## 2017-08-23 DIAGNOSIS — E1129 Type 2 diabetes mellitus with other diabetic kidney complication: Secondary | ICD-10-CM | POA: Diagnosis not present

## 2017-08-23 DIAGNOSIS — N2581 Secondary hyperparathyroidism of renal origin: Secondary | ICD-10-CM | POA: Diagnosis not present

## 2017-08-23 DIAGNOSIS — N186 End stage renal disease: Secondary | ICD-10-CM | POA: Diagnosis not present

## 2017-08-26 DIAGNOSIS — N186 End stage renal disease: Secondary | ICD-10-CM | POA: Diagnosis not present

## 2017-08-26 DIAGNOSIS — N2581 Secondary hyperparathyroidism of renal origin: Secondary | ICD-10-CM | POA: Diagnosis not present

## 2017-08-28 DIAGNOSIS — E875 Hyperkalemia: Secondary | ICD-10-CM | POA: Diagnosis not present

## 2017-08-28 DIAGNOSIS — D631 Anemia in chronic kidney disease: Secondary | ICD-10-CM | POA: Diagnosis not present

## 2017-08-28 DIAGNOSIS — N2581 Secondary hyperparathyroidism of renal origin: Secondary | ICD-10-CM | POA: Diagnosis not present

## 2017-08-28 DIAGNOSIS — N186 End stage renal disease: Secondary | ICD-10-CM | POA: Diagnosis not present

## 2017-08-30 DIAGNOSIS — D631 Anemia in chronic kidney disease: Secondary | ICD-10-CM | POA: Diagnosis not present

## 2017-08-30 DIAGNOSIS — N2581 Secondary hyperparathyroidism of renal origin: Secondary | ICD-10-CM | POA: Diagnosis not present

## 2017-08-30 DIAGNOSIS — E875 Hyperkalemia: Secondary | ICD-10-CM | POA: Diagnosis not present

## 2017-08-30 DIAGNOSIS — N186 End stage renal disease: Secondary | ICD-10-CM | POA: Diagnosis not present

## 2017-09-02 DIAGNOSIS — D631 Anemia in chronic kidney disease: Secondary | ICD-10-CM | POA: Diagnosis not present

## 2017-09-02 DIAGNOSIS — N2581 Secondary hyperparathyroidism of renal origin: Secondary | ICD-10-CM | POA: Diagnosis not present

## 2017-09-02 DIAGNOSIS — N186 End stage renal disease: Secondary | ICD-10-CM | POA: Diagnosis not present

## 2017-09-02 DIAGNOSIS — E875 Hyperkalemia: Secondary | ICD-10-CM | POA: Diagnosis not present

## 2017-09-04 DIAGNOSIS — E875 Hyperkalemia: Secondary | ICD-10-CM | POA: Diagnosis not present

## 2017-09-04 DIAGNOSIS — N186 End stage renal disease: Secondary | ICD-10-CM | POA: Diagnosis not present

## 2017-09-04 DIAGNOSIS — D631 Anemia in chronic kidney disease: Secondary | ICD-10-CM | POA: Diagnosis not present

## 2017-09-04 DIAGNOSIS — N2581 Secondary hyperparathyroidism of renal origin: Secondary | ICD-10-CM | POA: Diagnosis not present

## 2017-09-06 DIAGNOSIS — E875 Hyperkalemia: Secondary | ICD-10-CM | POA: Diagnosis not present

## 2017-09-06 DIAGNOSIS — N2581 Secondary hyperparathyroidism of renal origin: Secondary | ICD-10-CM | POA: Diagnosis not present

## 2017-09-06 DIAGNOSIS — N186 End stage renal disease: Secondary | ICD-10-CM | POA: Diagnosis not present

## 2017-09-06 DIAGNOSIS — D631 Anemia in chronic kidney disease: Secondary | ICD-10-CM | POA: Diagnosis not present

## 2017-09-09 DIAGNOSIS — N186 End stage renal disease: Secondary | ICD-10-CM | POA: Diagnosis not present

## 2017-09-09 DIAGNOSIS — N2581 Secondary hyperparathyroidism of renal origin: Secondary | ICD-10-CM | POA: Diagnosis not present

## 2017-09-09 DIAGNOSIS — D631 Anemia in chronic kidney disease: Secondary | ICD-10-CM | POA: Diagnosis not present

## 2017-09-09 DIAGNOSIS — E875 Hyperkalemia: Secondary | ICD-10-CM | POA: Diagnosis not present

## 2017-09-11 DIAGNOSIS — N186 End stage renal disease: Secondary | ICD-10-CM | POA: Diagnosis not present

## 2017-09-11 DIAGNOSIS — E875 Hyperkalemia: Secondary | ICD-10-CM | POA: Diagnosis not present

## 2017-09-11 DIAGNOSIS — N2581 Secondary hyperparathyroidism of renal origin: Secondary | ICD-10-CM | POA: Diagnosis not present

## 2017-09-11 DIAGNOSIS — D631 Anemia in chronic kidney disease: Secondary | ICD-10-CM | POA: Diagnosis not present

## 2017-09-12 DIAGNOSIS — Z992 Dependence on renal dialysis: Secondary | ICD-10-CM | POA: Diagnosis not present

## 2017-09-12 DIAGNOSIS — Z202 Contact with and (suspected) exposure to infections with a predominantly sexual mode of transmission: Secondary | ICD-10-CM | POA: Diagnosis not present

## 2017-09-12 DIAGNOSIS — R3915 Urgency of urination: Secondary | ICD-10-CM | POA: Diagnosis not present

## 2017-09-12 DIAGNOSIS — I12 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 stage renal disease: Secondary | ICD-10-CM | POA: Diagnosis not present

## 2017-09-12 DIAGNOSIS — N186 End stage renal disease: Secondary | ICD-10-CM | POA: Diagnosis not present

## 2017-09-13 DIAGNOSIS — E875 Hyperkalemia: Secondary | ICD-10-CM | POA: Diagnosis not present

## 2017-09-13 DIAGNOSIS — D631 Anemia in chronic kidney disease: Secondary | ICD-10-CM | POA: Diagnosis not present

## 2017-09-13 DIAGNOSIS — D509 Iron deficiency anemia, unspecified: Secondary | ICD-10-CM | POA: Diagnosis not present

## 2017-09-13 DIAGNOSIS — N186 End stage renal disease: Secondary | ICD-10-CM | POA: Diagnosis not present

## 2017-09-13 DIAGNOSIS — N2581 Secondary hyperparathyroidism of renal origin: Secondary | ICD-10-CM | POA: Diagnosis not present

## 2017-09-16 DIAGNOSIS — N186 End stage renal disease: Secondary | ICD-10-CM | POA: Diagnosis not present

## 2017-09-16 DIAGNOSIS — E875 Hyperkalemia: Secondary | ICD-10-CM | POA: Diagnosis not present

## 2017-09-16 DIAGNOSIS — D509 Iron deficiency anemia, unspecified: Secondary | ICD-10-CM | POA: Diagnosis not present

## 2017-09-16 DIAGNOSIS — D631 Anemia in chronic kidney disease: Secondary | ICD-10-CM | POA: Diagnosis not present

## 2017-09-16 DIAGNOSIS — N2581 Secondary hyperparathyroidism of renal origin: Secondary | ICD-10-CM | POA: Diagnosis not present

## 2017-09-18 DIAGNOSIS — N2581 Secondary hyperparathyroidism of renal origin: Secondary | ICD-10-CM | POA: Diagnosis not present

## 2017-09-18 DIAGNOSIS — D509 Iron deficiency anemia, unspecified: Secondary | ICD-10-CM | POA: Diagnosis not present

## 2017-09-18 DIAGNOSIS — N186 End stage renal disease: Secondary | ICD-10-CM | POA: Diagnosis not present

## 2017-09-18 DIAGNOSIS — D631 Anemia in chronic kidney disease: Secondary | ICD-10-CM | POA: Diagnosis not present

## 2017-09-18 DIAGNOSIS — E875 Hyperkalemia: Secondary | ICD-10-CM | POA: Diagnosis not present

## 2017-09-20 DIAGNOSIS — E875 Hyperkalemia: Secondary | ICD-10-CM | POA: Diagnosis not present

## 2017-09-20 DIAGNOSIS — N186 End stage renal disease: Secondary | ICD-10-CM | POA: Diagnosis not present

## 2017-09-20 DIAGNOSIS — N2581 Secondary hyperparathyroidism of renal origin: Secondary | ICD-10-CM | POA: Diagnosis not present

## 2017-09-20 DIAGNOSIS — D631 Anemia in chronic kidney disease: Secondary | ICD-10-CM | POA: Diagnosis not present

## 2017-09-20 DIAGNOSIS — D509 Iron deficiency anemia, unspecified: Secondary | ICD-10-CM | POA: Diagnosis not present

## 2017-09-23 DIAGNOSIS — D509 Iron deficiency anemia, unspecified: Secondary | ICD-10-CM | POA: Diagnosis not present

## 2017-09-23 DIAGNOSIS — D631 Anemia in chronic kidney disease: Secondary | ICD-10-CM | POA: Diagnosis not present

## 2017-09-23 DIAGNOSIS — E875 Hyperkalemia: Secondary | ICD-10-CM | POA: Diagnosis not present

## 2017-09-23 DIAGNOSIS — N186 End stage renal disease: Secondary | ICD-10-CM | POA: Diagnosis not present

## 2017-09-23 DIAGNOSIS — N2581 Secondary hyperparathyroidism of renal origin: Secondary | ICD-10-CM | POA: Diagnosis not present

## 2017-09-27 DIAGNOSIS — D631 Anemia in chronic kidney disease: Secondary | ICD-10-CM | POA: Diagnosis not present

## 2017-09-27 DIAGNOSIS — E875 Hyperkalemia: Secondary | ICD-10-CM | POA: Diagnosis not present

## 2017-09-27 DIAGNOSIS — D509 Iron deficiency anemia, unspecified: Secondary | ICD-10-CM | POA: Diagnosis not present

## 2017-09-27 DIAGNOSIS — N2581 Secondary hyperparathyroidism of renal origin: Secondary | ICD-10-CM | POA: Diagnosis not present

## 2017-09-27 DIAGNOSIS — N186 End stage renal disease: Secondary | ICD-10-CM | POA: Diagnosis not present

## 2017-09-30 DIAGNOSIS — E875 Hyperkalemia: Secondary | ICD-10-CM | POA: Diagnosis not present

## 2017-09-30 DIAGNOSIS — D509 Iron deficiency anemia, unspecified: Secondary | ICD-10-CM | POA: Diagnosis not present

## 2017-09-30 DIAGNOSIS — D631 Anemia in chronic kidney disease: Secondary | ICD-10-CM | POA: Diagnosis not present

## 2017-09-30 DIAGNOSIS — N186 End stage renal disease: Secondary | ICD-10-CM | POA: Diagnosis not present

## 2017-09-30 DIAGNOSIS — N2581 Secondary hyperparathyroidism of renal origin: Secondary | ICD-10-CM | POA: Diagnosis not present

## 2017-10-02 DIAGNOSIS — N186 End stage renal disease: Secondary | ICD-10-CM | POA: Diagnosis not present

## 2017-10-02 DIAGNOSIS — D509 Iron deficiency anemia, unspecified: Secondary | ICD-10-CM | POA: Diagnosis not present

## 2017-10-02 DIAGNOSIS — E875 Hyperkalemia: Secondary | ICD-10-CM | POA: Diagnosis not present

## 2017-10-02 DIAGNOSIS — N2581 Secondary hyperparathyroidism of renal origin: Secondary | ICD-10-CM | POA: Diagnosis not present

## 2017-10-02 DIAGNOSIS — D631 Anemia in chronic kidney disease: Secondary | ICD-10-CM | POA: Diagnosis not present

## 2017-10-04 DIAGNOSIS — D631 Anemia in chronic kidney disease: Secondary | ICD-10-CM | POA: Diagnosis not present

## 2017-10-04 DIAGNOSIS — N2581 Secondary hyperparathyroidism of renal origin: Secondary | ICD-10-CM | POA: Diagnosis not present

## 2017-10-04 DIAGNOSIS — N186 End stage renal disease: Secondary | ICD-10-CM | POA: Diagnosis not present

## 2017-10-04 DIAGNOSIS — E875 Hyperkalemia: Secondary | ICD-10-CM | POA: Diagnosis not present

## 2017-10-04 DIAGNOSIS — D509 Iron deficiency anemia, unspecified: Secondary | ICD-10-CM | POA: Diagnosis not present

## 2017-10-07 DIAGNOSIS — N2581 Secondary hyperparathyroidism of renal origin: Secondary | ICD-10-CM | POA: Diagnosis not present

## 2017-10-07 DIAGNOSIS — N186 End stage renal disease: Secondary | ICD-10-CM | POA: Diagnosis not present

## 2017-10-07 DIAGNOSIS — D631 Anemia in chronic kidney disease: Secondary | ICD-10-CM | POA: Diagnosis not present

## 2017-10-07 DIAGNOSIS — D509 Iron deficiency anemia, unspecified: Secondary | ICD-10-CM | POA: Diagnosis not present

## 2017-10-07 DIAGNOSIS — E875 Hyperkalemia: Secondary | ICD-10-CM | POA: Diagnosis not present

## 2017-10-09 DIAGNOSIS — N186 End stage renal disease: Secondary | ICD-10-CM | POA: Diagnosis not present

## 2017-10-09 DIAGNOSIS — E875 Hyperkalemia: Secondary | ICD-10-CM | POA: Diagnosis not present

## 2017-10-09 DIAGNOSIS — D509 Iron deficiency anemia, unspecified: Secondary | ICD-10-CM | POA: Diagnosis not present

## 2017-10-09 DIAGNOSIS — N2581 Secondary hyperparathyroidism of renal origin: Secondary | ICD-10-CM | POA: Diagnosis not present

## 2017-10-09 DIAGNOSIS — D631 Anemia in chronic kidney disease: Secondary | ICD-10-CM | POA: Diagnosis not present

## 2017-10-10 DIAGNOSIS — Z992 Dependence on renal dialysis: Secondary | ICD-10-CM | POA: Diagnosis not present

## 2017-10-10 DIAGNOSIS — N186 End stage renal disease: Secondary | ICD-10-CM | POA: Diagnosis not present

## 2017-10-10 DIAGNOSIS — I871 Compression of vein: Secondary | ICD-10-CM | POA: Diagnosis not present

## 2017-10-10 DIAGNOSIS — T82858A Stenosis of vascular prosthetic devices, implants and grafts, initial encounter: Secondary | ICD-10-CM | POA: Diagnosis not present

## 2017-10-11 DIAGNOSIS — N186 End stage renal disease: Secondary | ICD-10-CM | POA: Diagnosis not present

## 2017-10-11 DIAGNOSIS — E875 Hyperkalemia: Secondary | ICD-10-CM | POA: Diagnosis not present

## 2017-10-11 DIAGNOSIS — D509 Iron deficiency anemia, unspecified: Secondary | ICD-10-CM | POA: Diagnosis not present

## 2017-10-11 DIAGNOSIS — N2581 Secondary hyperparathyroidism of renal origin: Secondary | ICD-10-CM | POA: Diagnosis not present

## 2017-10-11 DIAGNOSIS — D631 Anemia in chronic kidney disease: Secondary | ICD-10-CM | POA: Diagnosis not present

## 2017-10-13 DIAGNOSIS — N186 End stage renal disease: Secondary | ICD-10-CM | POA: Diagnosis not present

## 2017-10-13 DIAGNOSIS — Z992 Dependence on renal dialysis: Secondary | ICD-10-CM | POA: Diagnosis not present

## 2017-10-13 DIAGNOSIS — I12 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 stage renal disease: Secondary | ICD-10-CM | POA: Diagnosis not present

## 2017-10-14 DIAGNOSIS — E875 Hyperkalemia: Secondary | ICD-10-CM | POA: Diagnosis not present

## 2017-10-14 DIAGNOSIS — N2581 Secondary hyperparathyroidism of renal origin: Secondary | ICD-10-CM | POA: Diagnosis not present

## 2017-10-14 DIAGNOSIS — D509 Iron deficiency anemia, unspecified: Secondary | ICD-10-CM | POA: Diagnosis not present

## 2017-10-14 DIAGNOSIS — D631 Anemia in chronic kidney disease: Secondary | ICD-10-CM | POA: Diagnosis not present

## 2017-10-14 DIAGNOSIS — N186 End stage renal disease: Secondary | ICD-10-CM | POA: Diagnosis not present

## 2017-10-14 DIAGNOSIS — Z23 Encounter for immunization: Secondary | ICD-10-CM | POA: Diagnosis not present

## 2017-10-16 DIAGNOSIS — Z23 Encounter for immunization: Secondary | ICD-10-CM | POA: Diagnosis not present

## 2017-10-16 DIAGNOSIS — N2581 Secondary hyperparathyroidism of renal origin: Secondary | ICD-10-CM | POA: Diagnosis not present

## 2017-10-16 DIAGNOSIS — E875 Hyperkalemia: Secondary | ICD-10-CM | POA: Diagnosis not present

## 2017-10-16 DIAGNOSIS — N186 End stage renal disease: Secondary | ICD-10-CM | POA: Diagnosis not present

## 2017-10-16 DIAGNOSIS — D509 Iron deficiency anemia, unspecified: Secondary | ICD-10-CM | POA: Diagnosis not present

## 2017-10-16 DIAGNOSIS — D631 Anemia in chronic kidney disease: Secondary | ICD-10-CM | POA: Diagnosis not present

## 2017-10-18 DIAGNOSIS — N186 End stage renal disease: Secondary | ICD-10-CM | POA: Diagnosis not present

## 2017-10-18 DIAGNOSIS — N2581 Secondary hyperparathyroidism of renal origin: Secondary | ICD-10-CM | POA: Diagnosis not present

## 2017-10-18 DIAGNOSIS — Z23 Encounter for immunization: Secondary | ICD-10-CM | POA: Diagnosis not present

## 2017-10-18 DIAGNOSIS — D631 Anemia in chronic kidney disease: Secondary | ICD-10-CM | POA: Diagnosis not present

## 2017-10-18 DIAGNOSIS — D509 Iron deficiency anemia, unspecified: Secondary | ICD-10-CM | POA: Diagnosis not present

## 2017-10-18 DIAGNOSIS — E875 Hyperkalemia: Secondary | ICD-10-CM | POA: Diagnosis not present

## 2017-10-21 DIAGNOSIS — E875 Hyperkalemia: Secondary | ICD-10-CM | POA: Diagnosis not present

## 2017-10-21 DIAGNOSIS — N186 End stage renal disease: Secondary | ICD-10-CM | POA: Diagnosis not present

## 2017-10-21 DIAGNOSIS — D631 Anemia in chronic kidney disease: Secondary | ICD-10-CM | POA: Diagnosis not present

## 2017-10-21 DIAGNOSIS — N2581 Secondary hyperparathyroidism of renal origin: Secondary | ICD-10-CM | POA: Diagnosis not present

## 2017-10-21 DIAGNOSIS — D509 Iron deficiency anemia, unspecified: Secondary | ICD-10-CM | POA: Diagnosis not present

## 2017-10-21 DIAGNOSIS — Z23 Encounter for immunization: Secondary | ICD-10-CM | POA: Diagnosis not present

## 2017-10-23 DIAGNOSIS — N186 End stage renal disease: Secondary | ICD-10-CM | POA: Diagnosis not present

## 2017-10-23 DIAGNOSIS — D631 Anemia in chronic kidney disease: Secondary | ICD-10-CM | POA: Diagnosis not present

## 2017-10-23 DIAGNOSIS — D509 Iron deficiency anemia, unspecified: Secondary | ICD-10-CM | POA: Diagnosis not present

## 2017-10-23 DIAGNOSIS — Z23 Encounter for immunization: Secondary | ICD-10-CM | POA: Diagnosis not present

## 2017-10-23 DIAGNOSIS — N2581 Secondary hyperparathyroidism of renal origin: Secondary | ICD-10-CM | POA: Diagnosis not present

## 2017-10-23 DIAGNOSIS — E875 Hyperkalemia: Secondary | ICD-10-CM | POA: Diagnosis not present

## 2017-10-25 DIAGNOSIS — N2581 Secondary hyperparathyroidism of renal origin: Secondary | ICD-10-CM | POA: Diagnosis not present

## 2017-10-25 DIAGNOSIS — N186 End stage renal disease: Secondary | ICD-10-CM | POA: Diagnosis not present

## 2017-10-25 DIAGNOSIS — Z23 Encounter for immunization: Secondary | ICD-10-CM | POA: Diagnosis not present

## 2017-10-25 DIAGNOSIS — D509 Iron deficiency anemia, unspecified: Secondary | ICD-10-CM | POA: Diagnosis not present

## 2017-10-25 DIAGNOSIS — D631 Anemia in chronic kidney disease: Secondary | ICD-10-CM | POA: Diagnosis not present

## 2017-10-25 DIAGNOSIS — E875 Hyperkalemia: Secondary | ICD-10-CM | POA: Diagnosis not present

## 2017-10-28 DIAGNOSIS — N186 End stage renal disease: Secondary | ICD-10-CM | POA: Diagnosis not present

## 2017-10-28 DIAGNOSIS — Z992 Dependence on renal dialysis: Secondary | ICD-10-CM | POA: Diagnosis not present

## 2017-10-28 DIAGNOSIS — E875 Hyperkalemia: Secondary | ICD-10-CM | POA: Diagnosis not present

## 2017-10-28 DIAGNOSIS — D509 Iron deficiency anemia, unspecified: Secondary | ICD-10-CM | POA: Diagnosis not present

## 2017-10-28 DIAGNOSIS — N2581 Secondary hyperparathyroidism of renal origin: Secondary | ICD-10-CM | POA: Diagnosis not present

## 2017-10-28 DIAGNOSIS — D631 Anemia in chronic kidney disease: Secondary | ICD-10-CM | POA: Diagnosis not present

## 2017-10-28 DIAGNOSIS — G63 Polyneuropathy in diseases classified elsewhere: Secondary | ICD-10-CM | POA: Diagnosis not present

## 2017-10-28 DIAGNOSIS — Z23 Encounter for immunization: Secondary | ICD-10-CM | POA: Diagnosis not present

## 2017-10-28 DIAGNOSIS — Z01818 Encounter for other preprocedural examination: Secondary | ICD-10-CM | POA: Diagnosis not present

## 2017-10-28 DIAGNOSIS — E7800 Pure hypercholesterolemia, unspecified: Secondary | ICD-10-CM | POA: Diagnosis not present

## 2017-10-30 ENCOUNTER — Ambulatory Visit
Admission: RE | Admit: 2017-10-30 | Discharge: 2017-10-30 | Disposition: A | Discharge status: Home / Self Care | Payer: Medicare Other | Source: Ambulatory Visit | Attending: Family Medicine | Admitting: Family Medicine

## 2017-10-30 ENCOUNTER — Other Ambulatory Visit: Payer: Self-pay | Admitting: Family Medicine

## 2017-10-30 DIAGNOSIS — Z01818 Encounter for other preprocedural examination: Secondary | ICD-10-CM | POA: Diagnosis not present

## 2017-10-30 DIAGNOSIS — E875 Hyperkalemia: Secondary | ICD-10-CM | POA: Diagnosis not present

## 2017-10-30 DIAGNOSIS — D509 Iron deficiency anemia, unspecified: Secondary | ICD-10-CM | POA: Diagnosis not present

## 2017-10-30 DIAGNOSIS — D631 Anemia in chronic kidney disease: Secondary | ICD-10-CM | POA: Diagnosis not present

## 2017-10-30 DIAGNOSIS — Z23 Encounter for immunization: Secondary | ICD-10-CM | POA: Diagnosis not present

## 2017-10-30 DIAGNOSIS — N186 End stage renal disease: Secondary | ICD-10-CM | POA: Diagnosis not present

## 2017-10-30 DIAGNOSIS — N2581 Secondary hyperparathyroidism of renal origin: Secondary | ICD-10-CM | POA: Diagnosis not present

## 2017-10-30 DIAGNOSIS — J90 Pleural effusion, not elsewhere classified: Secondary | ICD-10-CM | POA: Diagnosis not present

## 2017-10-30 NOTE — Progress Notes (Deleted)
STROKE NEUROLOGY FOLLOW UP NOTE  NAME: Carl Siemen Sr. DOB: [birth year]  REASON FOR VISIT: stroke follow up HISTORY FROM: pt and chart  Today we had the pleasure of seeing Carl Rollo Sr. in follow-up at our Neurology Clinic. Pt was accompanied by no one.   History Summary [name] is a 57 y.o. male with history of HTN, ESRD on HD was admitted for acute onset of dizziness on 05/22/14. He was helping his brother for car repair and struck his head at the top to the hood. A couple of hours later, he felt very dizzy and not right. 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 Also, right occipital pole has tiny DWI changes which may related to PRES. He was admitted for further evaluation. Stroke work up including MRA, TTE and CUS unremarkable. LDL 91. During hospitalization, his dizziness resolved and he was discharged with ASA 325 and lipitor.  Patient did have an admission on 12/02/15 for sudden onset RUE jerking, numbness and weakness, resolved prior to ED arrival.  EEG normal. LDL 72 and A1c 4.8. Due to concerning for seizure, he was put on Keppra 750 mg.  Per prior visit notes, patient complained of increase in headache therefore Keppra was stopped with resolution of headache.  02/21/16 visit JX: Since last appointment, HA has been present every other day. Tylenol extra strength 2 tabs, ibuprofen 800mg , Excedrin 2 tabs  - will take one med and if doesn't work will take another. Cool compression will relieve. Works for at least 4-5 hours then HA returns - same severity prior to taking medication. At times, will wake up without HA but other times will have faint HA. He is unsure if increase in HA is due to stress with kidney issues. Every other day HA has been happening for past few months. N/V present with dry heaving. At times sensitive to lights. Denies auras. Has not noticed if  anything makes HA worse. Denies correlation between HA and HD treatments. Pain located in frontal forehead from temrpal to tempral. At times radiates to top of head. Radiates down left side of neck.   10/25/2017 visit JX: During the interval time, pt has been doing well. On topomax twice a day and no more HA. I have received 11/2016 ophthalmology note from Dr. Katy Fitch office and no papilledema seen at that time. MRI and MRV brain repeat showed stable without changes. Otherwise, he is doing well and will have kidney surgery next month for preparation of kidney transplant. BP 125/74.   Interval history 10/31/2017:      REVIEW OF SYSTEMS: Full 14 system review of systems performed and notable only for those listed below and in HPI above, all others are negative:  Constitutional: HA/migraine Cardiovascular:  Ear/Nose/Throat:   Skin:  Eyes:   Respiratory:   Gastroitestinal:   Genitourinary:  Hematology/Lymphatic:   Endocrine:  Musculoskeletal:   Allergy/Immunology:   Neurological:   Psychiatric:  Sleep:   The following represents the patient's updated allergies and side effects list: Allergies  Allergen Reactions  . Infed [Iron Dextran] Other (See Comments)    Decreased BP   . Oxycodone Nausea Only  . Vicodin [Hydrocodone-Acetaminophen] Other (See Comments)    Decreased BP  . Whole Blood Other (See Comments)    Patient refuses whole blood "unless I am going to die"  . Diclofenac Other (See Comments)    Unknown  The neurologically relevant items on the patient's problem list were reviewed on today's visit.  Neurologic Examination  A problem focused neurological exam (12 or more points of the single system neurologic examination, vital signs counts as 1 point, cranial nerves count for 8 points) was performed.  There were no vitals taken for this visit.  General - Well nourished, well developed, in no apparent distress.  Ophthalmologic - difficult to visualize left fundus due  to small pupil.    Cardiovascular - Regular rate and rhythm with no murmur.  Mental Status -  Level of arousal and orientation to time, place, and person were intact. Language including expression, naming, repetition, comprehension was assessed and found intact. Attention span and concentration were normal. Fund of Knowledge was assessed and was intact.  Cranial Nerves II - XII - II - Visual field intact OU. III, IV, VI - Extraocular movements intact. V - Facial sensation intact bilaterally. VII - Facial movement intact bilaterally. VIII - Hearing & vestibular intact bilaterally. X - Palate elevates symmetrically. XI - Chin turning & shoulder shrug intact bilaterally. XII - Tongue protrusion intact.  Motor Strength - The patient's strength was normal in all extremities and pronator drift was absent.  Bulk was normal and fasciculations were absent.   Motor Tone - Muscle tone was assessed at the neck and appendages and was normal.  Reflexes - The patient's reflexes were 1+ in all extremities and he had no pathological reflexes.  Sensory - Light touch, temperature/pinprick were assessed and were normal.    Coordination - The patient had normal movements in the hands and feet with no ataxia or dysmetria.  Tremor was absent.  Gait and Station - The patient's transfers, posture, gait, station, and turns were observed as normal.  Data reviewed: I personally reviewed the images and agree with the radiology interpretations.  Ct Head (brain) Wo Contrast 05/22/2014 Prominent dural calcification along the falx and tentorium again demonstrated. 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 No acute intracranial abnormalities. No significant change since previous study.   MRI HEAD  05/23/2014 Diffuse dural calcifications/dural thickening. Parietal-occipital FLAIR abnormality greater on the right suspicious for posterior reversible encephalopathy syndrome. Other potential causes for this  appearance include that secondary to; artifact, meningitis, subarachnoid hemorrhage or result of external supplemental oxygen. Adequate flow within the superior sagittal sinus cannot be confirmed. This appearance may be chronic and related to the adjacent dural thickening however, sagittal sinus thrombosis could contribute to the parietal occipital FLAIR abnormality. Suggestion of tiny acute posterior left corona radiata infarct. Subtle restricted motion medial aspect of the right parietal -occipital lobe may represent result of posterior reversible encephalopathy syndrome or small infarct. Abnormal vessel extends from the left internal cerebral vein level along the left ambient cistern to the superior margin of the left internal auditory canal. Question small dural fistula. Opacification left petrous apex. Abnormal bone marrow may represent anemia of renal disease. 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 Left parotid 1.4 x 1 centimeter nonspecific lesion incompletely assessed on present exam.   MRA HEAD  05/23/2014 Exam is motion degraded. Mild narrowing and irregularity right internal carotid artery cavernous segment without high-grade stenosis. Prominent left posterior communicating artery origin infundibulum without discrete aneurysm. Hypoplastic A1 segment right anterior cerebral artery. Azygous configuration A2 segment anterior cerebral artery. Middle cerebral artery mild to moderate branch vessel narrowing and irregularity. Non visualized left posterior inferior cerebellar artery. Mild narrowing distal right vertebral artery. Nonvisualized right anterior inferior cerebellar artery. Posterior cerebral artery branch  vessel irregularity. Irregularity and narrowing superior cerebellar artery more notable on the right.   2D echo - - Left ventricle: The cavity size was normal. Systolic function was normal. The estimated ejection fraction was in the range of 55% to 60%. Wall motion was normal;  there were no regional wall motion abnormalities. Doppler parameters are consistent with abnormal left ventricular relaxation (grade 1 diastolic dysfunction). - Pericardium, extracardiac: A trivial pericardial effusion was identified.  CUS - Bilateral: 1-39% ICA stenosis. Vertebral artery flow is antegrade.  MRI head 09/16/14 - Abnormal MRI scan of the brain showing diffuse dural thickening with abnormal appearance of bilateral parieto-occipital cortex on T2/FLAIR images the finding of unclear significance with a differential discussed above. Stable incidental findings of Tornwaldt's cysts, opacification of the petrous apex and abnormal vessel in the ambient cistern.  MRA head 09/23/14 - Equivocal MRA head (without) demonstrating: 1. Small (72mm) posterior outpouching of the left left supra-clinoid internal carotid artery, may represent infundibular dilation at origin of left posterior communicating artery vs aneurysmal dilation. 2. No focal stenoses or occlusions of the medium to large sized vessels.   MRV 09/16/14 - Highly abnormal appearance of superior sagittal sinus with diminutive caliber likely related to patient's chronic dural thickening. Abnormal appearance of deep cerebral venous system likely related to degenerative caliber from dural thickening versus partial thrombosis. Correlate with catheter angiogram if clinically necessary  CT head 02/05/15 1. No acute intracranial abnormality. 2. Stable appearance of the head with prominence dural calcifications.  MRV 05/19/15 1. Decreased flow signal in the superior sagittal sinus anteriorly, left transverse sinus near the midline, deep cerebral veins (great vein of Galen and straight sinus) and other areas as mentioned above.  2. Prominent cortical cerebral veins noted throughout. 3. These findings may be related to chronic dural calcification / thickening vs chronic thrombosis with partial recannalization. 4. Compared to MRV from  09/16/14, after accounting for differences in MRI field strengths, there is no significant change.   MRI brain 05/19/15 diffuse calcification and thickening of the tentorium with mild diffuse thickening of the meninges throughout. Abnormal appearance of and thickening of the right parieto-occipital cortex of unclear etiology. Abnormal blood vessel coursing through the ambient cistern to the left internal auditory canal. Overall no significant change compared with previous MRI scan dated 09/16/14  MRI and MRA 12/02/15 1.  No acute intracranial abnormality. 2. Mild chronic small vessel disease appears stable since March. 2016 right parietal signal abnormality which was felt related to PRES has resolved without encephalomalacia. 3. 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 This appears stable since the MRV on 05/17/2015. 4. Stable since 2016 and largely unremarkable intracranial MRA. ICA siphon atherosclerosis without stenosis. Small right cavernous ICA segment pseudo-lesion versus infundibulum.  CUS 12/03/15 - Bilateral: intimal wall thickening CCA. Mild soft plaque origin ICA. 1-39% ICA plaquing. Vertebral artery flow is antegrade.  Mr Brain Wo Contrast 03/03/2017 1.    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 This appears stable when compared to the MRI from 12/02/2015. 2.   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 3.    There are no acute findings.   Mr Mrv Head Wo Cm 03/03/2017 1.   Absence of the anterior superior sagittal sinus and restricted flow in the posterior superior sagittal sinus. Absence of flow signal in the great vein of  Galen and the straight sinus. 2.   The cortical cerebral veins and the left veins of Trolard and Labbe are prominent. 3.    Absent or  reduced flow in the proximal left transverse sinus but normal flow more distally. 4.    Compared to the MR venogram from 05/17/2015, there is no interval change.  Component     Latest Ref Rng 05/23/2014 07/14/2014  Cholesterol     0 - 200 mg/dL 154   Triglycerides     <150 mg/dL 50   HDL Cholesterol     >39 mg/dL 53   Total CHOL/HDL Ratio      2.9   VLDL     0 - 40 mg/dL 10   LDL (calc)     0 - 99 mg/dL 91   Hemoglobin A1C     4.8 - 5.6 % 5.4   Mean Plasma Glucose      108   HIV Screen 4th Generation wRfx     Non Reactive  Non Reactive  Vitamin B-12     180 - 914 pg/mL  1227 (H)     Component     Latest Ref Rng & Units 10/02/2015  Cholesterol     0 - 200 mg/dL 129  Triglycerides     <150 mg/dL 64  HDL Cholesterol     >40 mg/dL 44  Total CHOL/HDL Ratio     RATIO 2.9  VLDL     0 - 40 mg/dL 13  LDL (calc)     0 - 99 mg/dL 72  Hemoglobin A1C     4.8 - 5.6 % 4.8  Mean Plasma Glucose     mg/dL 91    Assessment: [name] is a 57 year old male with history of left corona radiata infarct and possible proximal on 05/2014.  Vascular risk factors include HTN, HLD and ESRD on HD.    Plan:  - continue topamax 25mg  twice a day for HA prevention. At HD day, take topamax morning dose after HD. - continue ASA and lipitor for stroke prevention - seizure precautions. - Follow up with your primary care physician for stroke risk factor modification. Recommend maintain blood pressure goal <130/80, diabetes with hemoglobin A1c goal below 6.5% and lipids with LDL cholesterol goal below 70 mg/dL.  - follow up with nephrology for ESRD on HD - follow up with cardiology and GI regularly. - check BP at home and record - follow up in 6 months with Janett Billow.   I spent more than 25 minutes of face to face time with the patient. Greater than 50% of time was spent in counseling and coordination of care. We discussed further HA prevention, continue topamax and follow up with  GI/nephrology/ophthalmology.   Venancio Poisson, AGNP-BC  [facility] [address] [place], [place] [address]  Phone [phone] Fax [phone] Note: This document was prepared with digital dictation and possible smart phrase technology. Any transcriptional errors that result from this process are unintentional.

## 2017-10-31 ENCOUNTER — Ambulatory Visit: Payer: Medicare Other | Admitting: Adult Health

## 2017-10-31 ENCOUNTER — Telehealth: Payer: Self-pay

## 2017-10-31 NOTE — Telephone Encounter (Signed)
Patient no show for appointment today.

## 2017-11-01 ENCOUNTER — Encounter: Payer: Self-pay | Admitting: Adult Health

## 2017-11-01 DIAGNOSIS — N186 End stage renal disease: Secondary | ICD-10-CM | POA: Diagnosis not present

## 2017-11-01 DIAGNOSIS — N2581 Secondary hyperparathyroidism of renal origin: Secondary | ICD-10-CM | POA: Diagnosis not present

## 2017-11-01 DIAGNOSIS — D631 Anemia in chronic kidney disease: Secondary | ICD-10-CM | POA: Diagnosis not present

## 2017-11-01 DIAGNOSIS — D509 Iron deficiency anemia, unspecified: Secondary | ICD-10-CM | POA: Diagnosis not present

## 2017-11-01 DIAGNOSIS — E875 Hyperkalemia: Secondary | ICD-10-CM | POA: Diagnosis not present

## 2017-11-01 DIAGNOSIS — Z23 Encounter for immunization: Secondary | ICD-10-CM | POA: Diagnosis not present

## 2017-11-04 DIAGNOSIS — N186 End stage renal disease: Secondary | ICD-10-CM | POA: Diagnosis not present

## 2017-11-04 DIAGNOSIS — Z23 Encounter for immunization: Secondary | ICD-10-CM | POA: Diagnosis not present

## 2017-11-04 DIAGNOSIS — D509 Iron deficiency anemia, unspecified: Secondary | ICD-10-CM | POA: Diagnosis not present

## 2017-11-04 DIAGNOSIS — E875 Hyperkalemia: Secondary | ICD-10-CM | POA: Diagnosis not present

## 2017-11-04 DIAGNOSIS — D631 Anemia in chronic kidney disease: Secondary | ICD-10-CM | POA: Diagnosis not present

## 2017-11-04 DIAGNOSIS — N2581 Secondary hyperparathyroidism of renal origin: Secondary | ICD-10-CM | POA: Diagnosis not present

## 2017-11-05 ENCOUNTER — Ambulatory Visit (INDEPENDENT_AMBULATORY_CARE_PROVIDER_SITE_OTHER): Payer: Medicare Other | Admitting: Adult Health

## 2017-11-05 ENCOUNTER — Encounter: Payer: Self-pay | Admitting: Adult Health

## 2017-11-05 VITALS — BP 99/66 | HR 104 | Ht 75.0 in | Wt 194.8 lb

## 2017-11-05 DIAGNOSIS — I10 Essential (primary) hypertension: Secondary | ICD-10-CM | POA: Diagnosis not present

## 2017-11-05 DIAGNOSIS — E785 Hyperlipidemia, unspecified: Secondary | ICD-10-CM

## 2017-11-05 DIAGNOSIS — N186 End stage renal disease: Secondary | ICD-10-CM | POA: Diagnosis not present

## 2017-11-05 DIAGNOSIS — Z992 Dependence on renal dialysis: Secondary | ICD-10-CM | POA: Diagnosis not present

## 2017-11-05 DIAGNOSIS — R51 Headache: Secondary | ICD-10-CM

## 2017-11-05 DIAGNOSIS — R519 Headache, unspecified: Secondary | ICD-10-CM

## 2017-11-05 MED ORDER — TOPIRAMATE 25 MG PO TABS: 25.0000 mg | ORAL_TABLET | Freq: Two times a day (BID) | ORAL | 4 refills | Status: DC
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
## 2017-11-05 NOTE — Patient Instructions (Addendum)
Continue aspirin 81 mg daily  and lipitor  for secondary stroke prevention  Continue to follow up with PCP regarding cholesterol and blood pressure management   Continue topamax 25mg  twice a day for headache management - refill provided   Continue to monitor blood pressure at home  Maintain strict control of hypertension with blood pressure goal below 130/90, diabetes with hemoglobin A1c goal below 6.5% and cholesterol with LDL cholesterol (bad cholesterol) goal below 70 mg/dL. I also advised the patient to eat a healthy diet with plenty of whole grains, cereals, fruits and vegetables, exercise regularly and maintain ideal body weight.  Followup in the future with me in 1 year or call earlier if needed       Thank you for coming to see [place] at [facility]. I hope we have been able to provide you high quality care today.  You may receive a patient satisfaction survey over the next few weeks. We would appreciate your feedback and comments so that we may continue to improve ourselves and the health of our patients.

## 2017-11-06 DIAGNOSIS — N2581 Secondary hyperparathyroidism of renal origin: Secondary | ICD-10-CM | POA: Diagnosis not present

## 2017-11-06 DIAGNOSIS — D509 Iron deficiency anemia, unspecified: Secondary | ICD-10-CM | POA: Diagnosis not present

## 2017-11-06 DIAGNOSIS — D631 Anemia in chronic kidney disease: Secondary | ICD-10-CM | POA: Diagnosis not present

## 2017-11-06 DIAGNOSIS — Z23 Encounter for immunization: Secondary | ICD-10-CM | POA: Diagnosis not present

## 2017-11-06 DIAGNOSIS — N186 End stage renal disease: Secondary | ICD-10-CM | POA: Diagnosis not present

## 2017-11-06 DIAGNOSIS — E875 Hyperkalemia: Secondary | ICD-10-CM | POA: Diagnosis not present

## 2017-11-08 DIAGNOSIS — Z23 Encounter for immunization: Secondary | ICD-10-CM | POA: Diagnosis not present

## 2017-11-08 DIAGNOSIS — N2581 Secondary hyperparathyroidism of renal origin: Secondary | ICD-10-CM | POA: Diagnosis not present

## 2017-11-08 DIAGNOSIS — E875 Hyperkalemia: Secondary | ICD-10-CM | POA: Diagnosis not present

## 2017-11-08 DIAGNOSIS — D509 Iron deficiency anemia, unspecified: Secondary | ICD-10-CM | POA: Diagnosis not present

## 2017-11-08 DIAGNOSIS — N186 End stage renal disease: Secondary | ICD-10-CM | POA: Diagnosis not present

## 2017-11-08 DIAGNOSIS — D631 Anemia in chronic kidney disease: Secondary | ICD-10-CM | POA: Diagnosis not present

## 2017-11-08 NOTE — Progress Notes (Signed)
I agree with the above plan 

## 2017-11-11 DIAGNOSIS — E875 Hyperkalemia: Secondary | ICD-10-CM | POA: Diagnosis not present

## 2017-11-11 DIAGNOSIS — N186 End stage renal disease: Secondary | ICD-10-CM | POA: Diagnosis not present

## 2017-11-11 DIAGNOSIS — N2581 Secondary hyperparathyroidism of renal origin: Secondary | ICD-10-CM | POA: Diagnosis not present

## 2017-11-11 DIAGNOSIS — D509 Iron deficiency anemia, unspecified: Secondary | ICD-10-CM | POA: Diagnosis not present

## 2017-11-11 DIAGNOSIS — D631 Anemia in chronic kidney disease: Secondary | ICD-10-CM | POA: Diagnosis not present

## 2017-11-11 DIAGNOSIS — Z23 Encounter for immunization: Secondary | ICD-10-CM | POA: Diagnosis not present

## 2017-11-12 DIAGNOSIS — I12 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 stage renal disease: Secondary | ICD-10-CM | POA: Diagnosis not present

## 2017-11-12 DIAGNOSIS — Z992 Dependence on renal dialysis: Secondary | ICD-10-CM | POA: Diagnosis not present

## 2017-11-12 DIAGNOSIS — N186 End stage renal disease: Secondary | ICD-10-CM | POA: Diagnosis not present

## 2017-11-13 DIAGNOSIS — E875 Hyperkalemia: Secondary | ICD-10-CM | POA: Diagnosis not present

## 2017-11-13 DIAGNOSIS — N2581 Secondary hyperparathyroidism of renal origin: Secondary | ICD-10-CM | POA: Diagnosis not present

## 2017-11-13 DIAGNOSIS — D631 Anemia in chronic kidney disease: Secondary | ICD-10-CM | POA: Diagnosis not present

## 2017-11-13 DIAGNOSIS — N186 End stage renal disease: Secondary | ICD-10-CM | POA: Diagnosis not present

## 2017-11-13 DIAGNOSIS — E1129 Type 2 diabetes mellitus with other diabetic kidney complication: Secondary | ICD-10-CM | POA: Diagnosis not present

## 2017-11-14 DIAGNOSIS — D49512 Neoplasm of unspecified behavior of left kidney: Secondary | ICD-10-CM | POA: Diagnosis not present

## 2017-11-14 DIAGNOSIS — N185 Chronic kidney disease, stage 5: Secondary | ICD-10-CM | POA: Diagnosis not present

## 2017-11-15 DIAGNOSIS — E875 Hyperkalemia: Secondary | ICD-10-CM | POA: Diagnosis not present

## 2017-11-15 DIAGNOSIS — N186 End stage renal disease: Secondary | ICD-10-CM | POA: Diagnosis not present

## 2017-11-15 DIAGNOSIS — N2581 Secondary hyperparathyroidism of renal origin: Secondary | ICD-10-CM | POA: Diagnosis not present

## 2017-11-15 DIAGNOSIS — D631 Anemia in chronic kidney disease: Secondary | ICD-10-CM | POA: Diagnosis not present

## 2017-11-16 DIAGNOSIS — D631 Anemia in chronic kidney disease: Secondary | ICD-10-CM | POA: Diagnosis not present

## 2017-11-16 DIAGNOSIS — E875 Hyperkalemia: Secondary | ICD-10-CM | POA: Diagnosis not present

## 2017-11-16 DIAGNOSIS — N186 End stage renal disease: Secondary | ICD-10-CM | POA: Diagnosis not present

## 2017-11-16 DIAGNOSIS — N2581 Secondary hyperparathyroidism of renal origin: Secondary | ICD-10-CM | POA: Diagnosis not present

## 2017-11-17 IMAGING — CT CT ABD-PELV W/ CM
2 of 5 series · 16 of 46 positions shown, 18 images · IV contrast (APPLIED)
Comparison: 09/02/2013

CLINICAL DATA: Diffuse abdominal pain particularly in the right
lower quadrant today

EXAM:
CT ABDOMEN AND PELVIS WITH CONTRAST
TECHNIQUE: Multidetector CT imaging of the abdomen and pelvis was performed
using the standard protocol following bolus administration of
intravenous contrast.
CONTRAST:  100 mL Ysovue-CMM

[Series 2: abd/ pelvis 5.0 i30f 1 · axial · 0.93mm/px · z∈[-506,-91]mm · 13 of 93 slices shown, 15 images]
[im 5/93  soft-tissue]
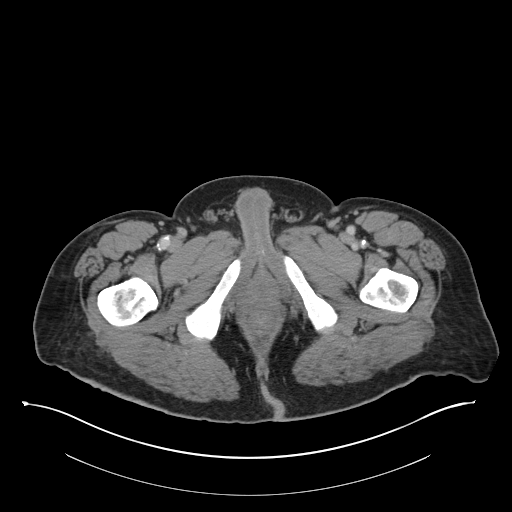
[im 5/93  bone]
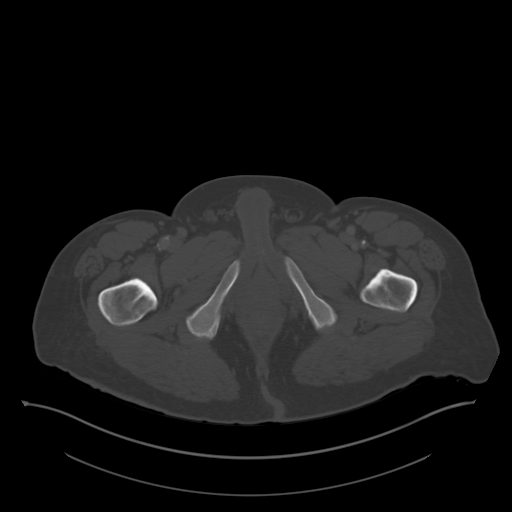
[im 14/93  soft-tissue]
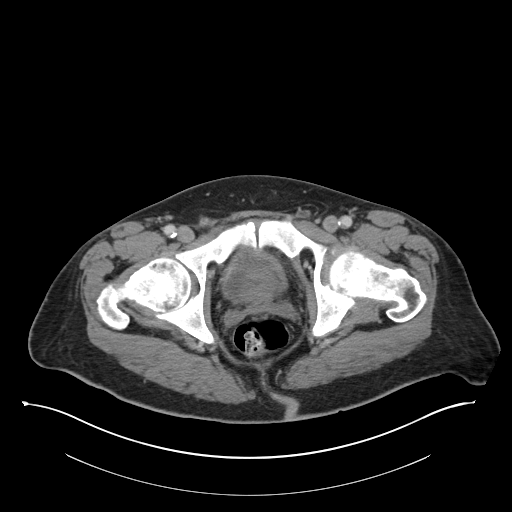
[im 19/93  soft-tissue]
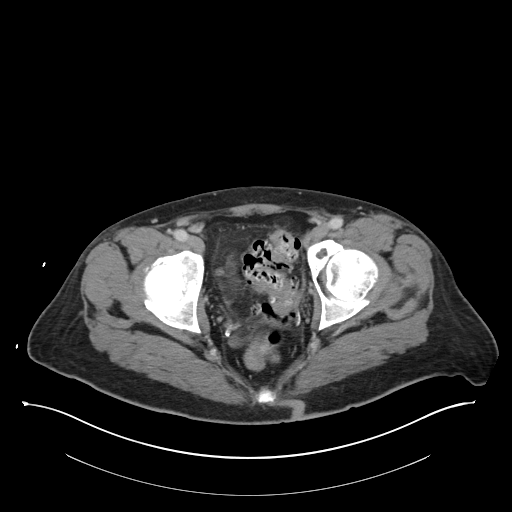
[im 28/93  soft-tissue]
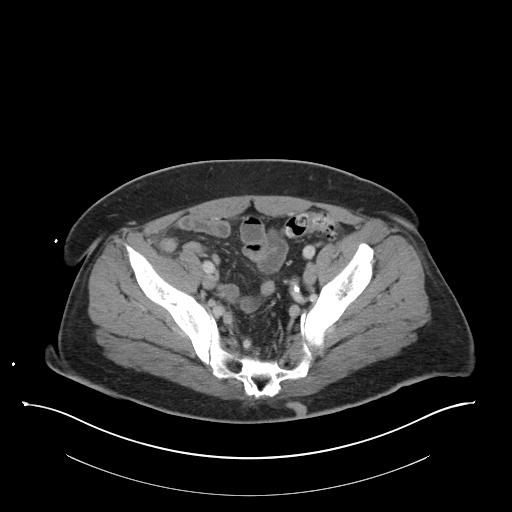
[im 33/93  soft-tissue]
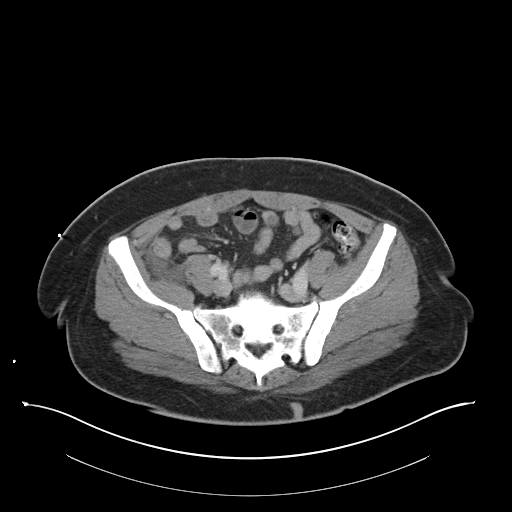
[im 42/93  soft-tissue]
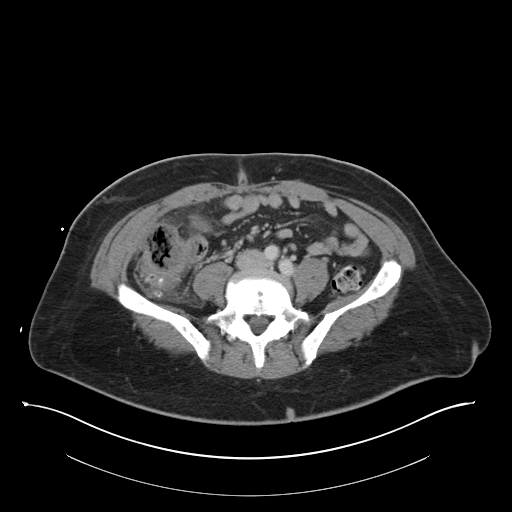
[im 47/93  soft-tissue]
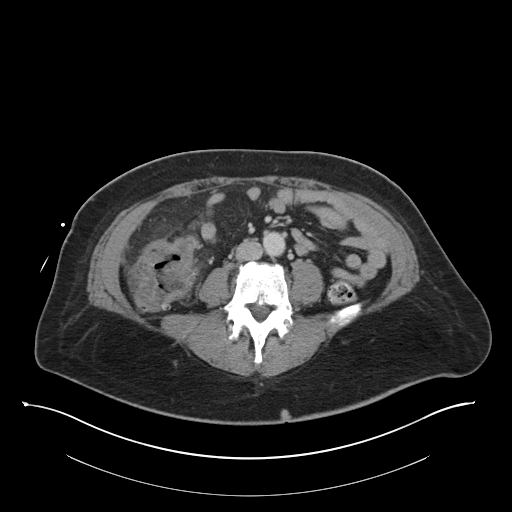
[im 51/93  soft-tissue]
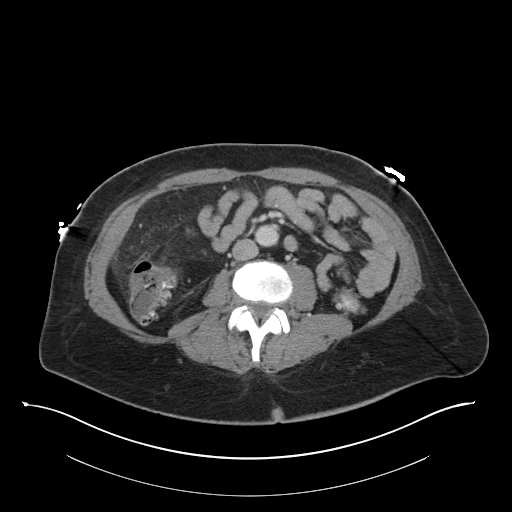
[im 60/93  soft-tissue]
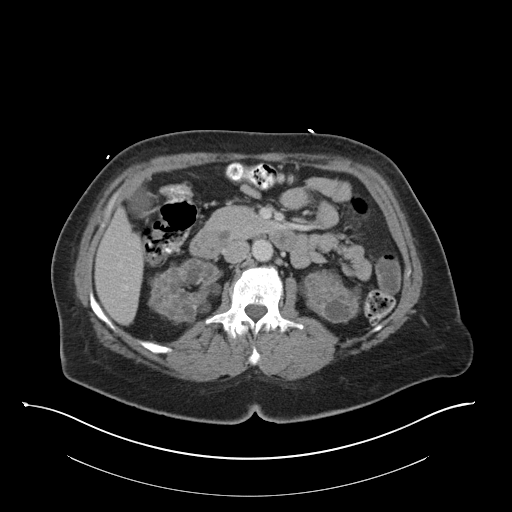
[im 60/93  bone]
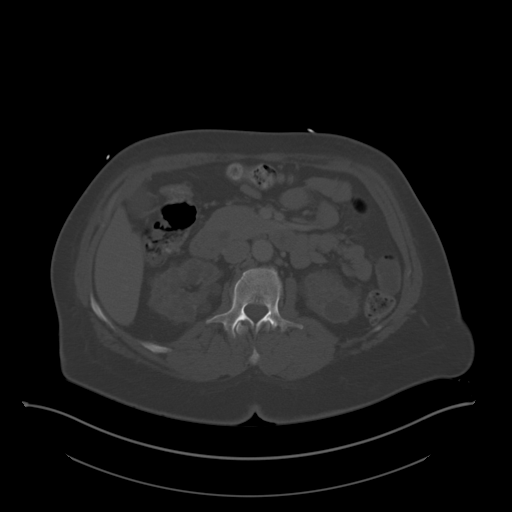
[im 65/93  soft-tissue]
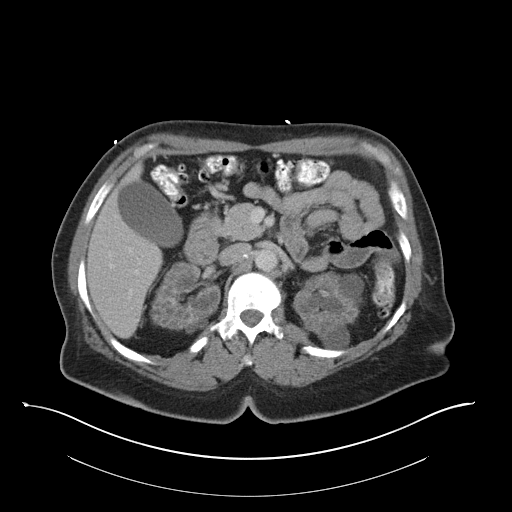
[im 74/93  soft-tissue]
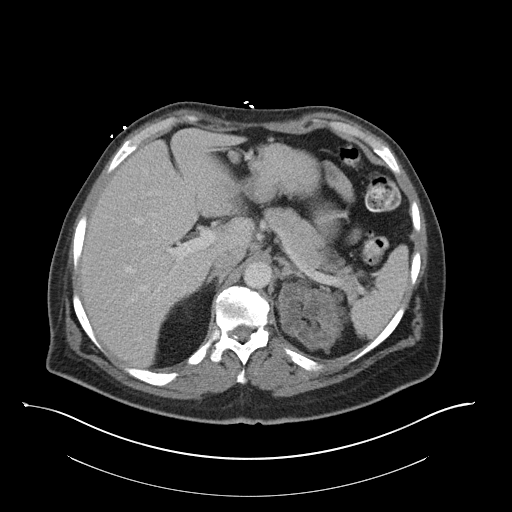
[im 79/93  soft-tissue]
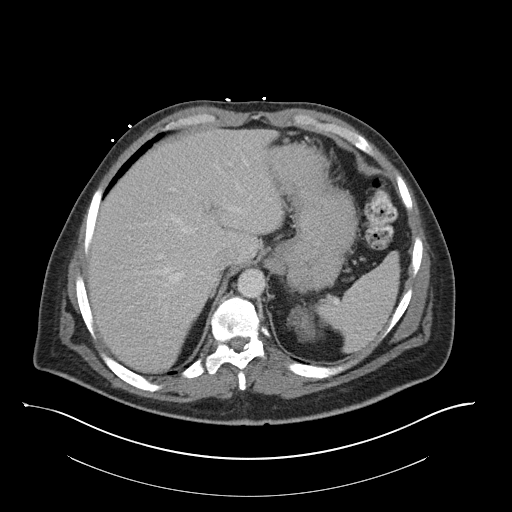
[im 88/93  soft-tissue]
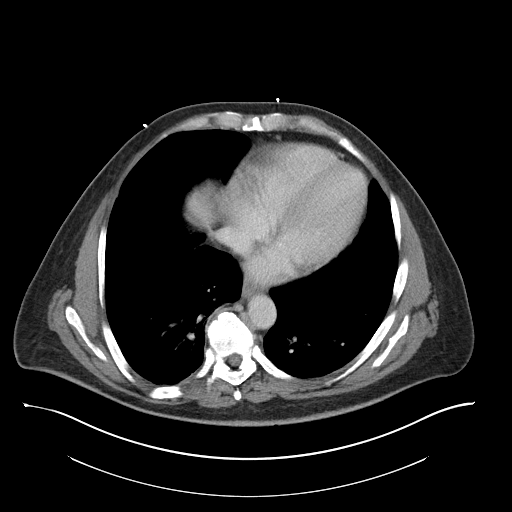

[Series 5: coronal soft tissue · coronal · 0.81mm/px · 3 of 97 slices shown]
[im 33/97  soft-tissue]
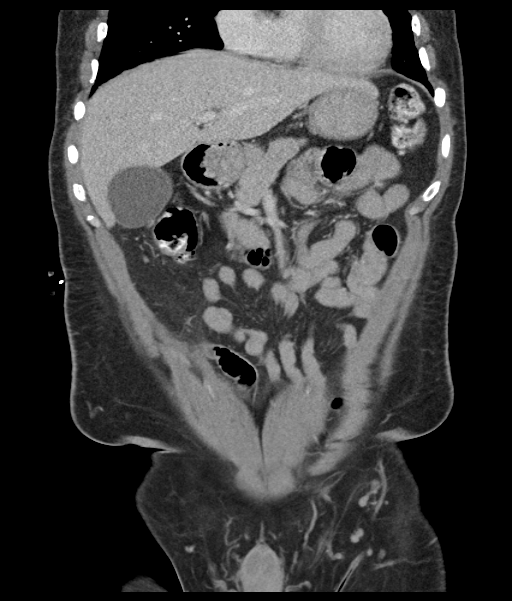
[im 43/97  soft-tissue]
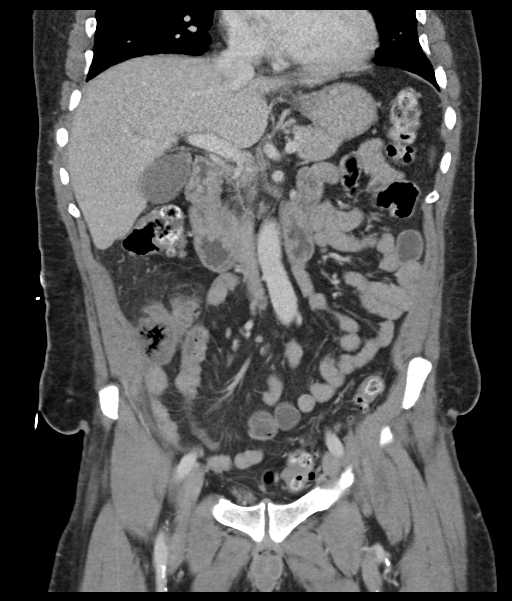
[im 54/97  soft-tissue]
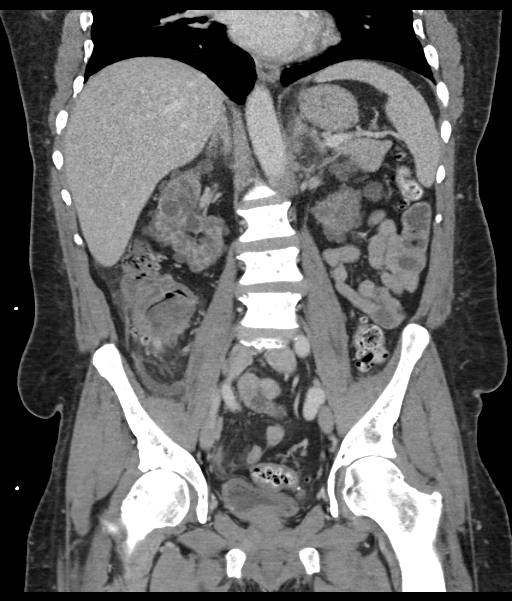

[16 of 46 positions shown; findings below may reference images not displayed]

FINDINGS: Lower chest: Possible partially visualized pulmonary nodule right
lung base on the cranial most image measuring at least 3 mm. This
could alternatively represent volume averaging with vascular
anatomy. There is mild dependent atelectasis.

Hepatobiliary: The liver and gallbladder are normal

Pancreas: Pancreas is normal

Spleen: Spleen is normal

Adrenals/Urinary Tract: Adrenal glands are normal. No
hydronephrosis. Innumerable bilateral renal cysts. 2 mm stone
midpole left kidney. Bladder decompressed and bladder wall appears
thickened.

Stomach/Bowel: There is extensive diverticulosis throughout the
entire large bowel particularly the cecum, descending colon, and
sigmoid colon. Sigmoid wall is thickened with moderate surrounding
inflammatory change. Appendix is prominent at 10 mm but the
inflammatory process does not appear to centered on the appendix.

Vascular/Lymphatic: No acute findings. Small right lower quadrant
lymph nodes nonpathologic by size criteria

Reproductive: Negative

Other: Trace right pericolic gutter free fluid

Musculoskeletal: No acute findings. Diffuse osteosclerosis likely
related to chronic renal disease.
IMPRESSION: 1. Findings most consistent with diverticulitis of the cecum.
2. Evidence of polycystic renal disease
3. Cannot exclude pulmonary nodule right lung base. CT thorax
without contrast recommended.

## 2017-11-18 DIAGNOSIS — E875 Hyperkalemia: Secondary | ICD-10-CM | POA: Diagnosis not present

## 2017-11-18 DIAGNOSIS — N2581 Secondary hyperparathyroidism of renal origin: Secondary | ICD-10-CM | POA: Diagnosis not present

## 2017-11-18 DIAGNOSIS — D631 Anemia in chronic kidney disease: Secondary | ICD-10-CM | POA: Diagnosis not present

## 2017-11-18 DIAGNOSIS — N186 End stage renal disease: Secondary | ICD-10-CM | POA: Diagnosis not present

## 2017-11-20 DIAGNOSIS — N186 End stage renal disease: Secondary | ICD-10-CM | POA: Diagnosis not present

## 2017-11-20 DIAGNOSIS — E875 Hyperkalemia: Secondary | ICD-10-CM | POA: Diagnosis not present

## 2017-11-20 DIAGNOSIS — D631 Anemia in chronic kidney disease: Secondary | ICD-10-CM | POA: Diagnosis not present

## 2017-11-20 DIAGNOSIS — N2581 Secondary hyperparathyroidism of renal origin: Secondary | ICD-10-CM | POA: Diagnosis not present

## 2017-11-25 DIAGNOSIS — N186 End stage renal disease: Secondary | ICD-10-CM | POA: Diagnosis not present

## 2017-11-25 DIAGNOSIS — D631 Anemia in chronic kidney disease: Secondary | ICD-10-CM | POA: Diagnosis not present

## 2017-11-25 DIAGNOSIS — N2581 Secondary hyperparathyroidism of renal origin: Secondary | ICD-10-CM | POA: Diagnosis not present

## 2017-11-25 DIAGNOSIS — E875 Hyperkalemia: Secondary | ICD-10-CM | POA: Diagnosis not present

## 2017-11-27 DIAGNOSIS — D631 Anemia in chronic kidney disease: Secondary | ICD-10-CM | POA: Diagnosis not present

## 2017-11-27 DIAGNOSIS — N186 End stage renal disease: Secondary | ICD-10-CM | POA: Diagnosis not present

## 2017-11-27 DIAGNOSIS — N2581 Secondary hyperparathyroidism of renal origin: Secondary | ICD-10-CM | POA: Diagnosis not present

## 2017-11-27 DIAGNOSIS — E875 Hyperkalemia: Secondary | ICD-10-CM | POA: Diagnosis not present

## 2017-11-28 DIAGNOSIS — D631 Anemia in chronic kidney disease: Secondary | ICD-10-CM | POA: Diagnosis not present

## 2017-11-28 DIAGNOSIS — E875 Hyperkalemia: Secondary | ICD-10-CM | POA: Diagnosis not present

## 2017-11-28 DIAGNOSIS — N2581 Secondary hyperparathyroidism of renal origin: Secondary | ICD-10-CM | POA: Diagnosis not present

## 2017-11-28 DIAGNOSIS — N186 End stage renal disease: Secondary | ICD-10-CM | POA: Diagnosis not present

## 2017-11-29 DIAGNOSIS — Z885 Allergy status to narcotic agent status: Secondary | ICD-10-CM | POA: Diagnosis not present

## 2017-11-29 DIAGNOSIS — Z87891 Personal history of nicotine dependence: Secondary | ICD-10-CM | POA: Diagnosis not present

## 2017-11-29 DIAGNOSIS — Z823 Family history of stroke: Secondary | ICD-10-CM | POA: Diagnosis not present

## 2017-11-29 DIAGNOSIS — Z888 Allergy status to other drugs, medicaments and biological substances status: Secondary | ICD-10-CM | POA: Diagnosis not present

## 2017-11-29 DIAGNOSIS — Z79899 Other long term (current) drug therapy: Secondary | ICD-10-CM | POA: Diagnosis not present

## 2017-11-29 DIAGNOSIS — C642 Malignant neoplasm of left kidney, except renal pelvis: Secondary | ICD-10-CM | POA: Diagnosis not present

## 2017-11-29 DIAGNOSIS — N186 End stage renal disease: Secondary | ICD-10-CM | POA: Diagnosis not present

## 2017-11-29 DIAGNOSIS — N2581 Secondary hyperparathyroidism of renal origin: Secondary | ICD-10-CM | POA: Diagnosis not present

## 2017-11-29 DIAGNOSIS — D631 Anemia in chronic kidney disease: Secondary | ICD-10-CM | POA: Diagnosis not present

## 2017-11-29 DIAGNOSIS — N281 Cyst of kidney, acquired: Secondary | ICD-10-CM | POA: Diagnosis not present

## 2017-11-29 DIAGNOSIS — N269 Renal sclerosis, unspecified: Secondary | ICD-10-CM | POA: Diagnosis not present

## 2017-11-29 DIAGNOSIS — Z833 Family history of diabetes mellitus: Secondary | ICD-10-CM | POA: Diagnosis not present

## 2017-11-29 DIAGNOSIS — Z7982 Long term (current) use of aspirin: Secondary | ICD-10-CM | POA: Diagnosis not present

## 2017-11-29 DIAGNOSIS — E1122 Type 2 diabetes mellitus with diabetic chronic kidney disease: Secondary | ICD-10-CM | POA: Diagnosis present

## 2017-11-29 DIAGNOSIS — I12 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 stage renal disease: Secondary | ICD-10-CM | POA: Diagnosis not present

## 2017-11-29 DIAGNOSIS — K429 Umbilical hernia without obstruction or gangrene: Secondary | ICD-10-CM | POA: Diagnosis present

## 2017-11-29 DIAGNOSIS — Z9049 Acquired absence of other specified parts of digestive tract: Secondary | ICD-10-CM | POA: Diagnosis not present

## 2017-11-29 DIAGNOSIS — Z992 Dependence on renal dialysis: Secondary | ICD-10-CM | POA: Diagnosis not present

## 2017-11-29 DIAGNOSIS — N2889 Other specified disorders of kidney and ureter: Secondary | ICD-10-CM | POA: Diagnosis not present

## 2017-11-29 DIAGNOSIS — Z8249 Family history of ischemic heart disease and other diseases of the circulatory system: Secondary | ICD-10-CM | POA: Diagnosis not present

## 2017-12-02 DIAGNOSIS — E875 Hyperkalemia: Secondary | ICD-10-CM | POA: Diagnosis not present

## 2017-12-02 DIAGNOSIS — D631 Anemia in chronic kidney disease: Secondary | ICD-10-CM | POA: Diagnosis not present

## 2017-12-02 DIAGNOSIS — N2581 Secondary hyperparathyroidism of renal origin: Secondary | ICD-10-CM | POA: Diagnosis not present

## 2017-12-02 DIAGNOSIS — N186 End stage renal disease: Secondary | ICD-10-CM | POA: Diagnosis not present

## 2017-12-03 DIAGNOSIS — N186 End stage renal disease: Secondary | ICD-10-CM | POA: Diagnosis not present

## 2017-12-03 DIAGNOSIS — N2889 Other specified disorders of kidney and ureter: Secondary | ICD-10-CM | POA: Diagnosis not present

## 2017-12-03 DIAGNOSIS — J209 Acute bronchitis, unspecified: Secondary | ICD-10-CM | POA: Diagnosis not present

## 2017-12-04 DIAGNOSIS — D631 Anemia in chronic kidney disease: Secondary | ICD-10-CM | POA: Diagnosis not present

## 2017-12-04 DIAGNOSIS — N186 End stage renal disease: Secondary | ICD-10-CM | POA: Diagnosis not present

## 2017-12-04 DIAGNOSIS — E875 Hyperkalemia: Secondary | ICD-10-CM | POA: Diagnosis not present

## 2017-12-04 DIAGNOSIS — N2581 Secondary hyperparathyroidism of renal origin: Secondary | ICD-10-CM | POA: Diagnosis not present

## 2017-12-06 DIAGNOSIS — N186 End stage renal disease: Secondary | ICD-10-CM | POA: Diagnosis not present

## 2017-12-06 DIAGNOSIS — D631 Anemia in chronic kidney disease: Secondary | ICD-10-CM | POA: Diagnosis not present

## 2017-12-06 DIAGNOSIS — E875 Hyperkalemia: Secondary | ICD-10-CM | POA: Diagnosis not present

## 2017-12-06 DIAGNOSIS — N2581 Secondary hyperparathyroidism of renal origin: Secondary | ICD-10-CM | POA: Diagnosis not present

## 2017-12-09 DIAGNOSIS — N2581 Secondary hyperparathyroidism of renal origin: Secondary | ICD-10-CM | POA: Diagnosis not present

## 2017-12-09 DIAGNOSIS — D631 Anemia in chronic kidney disease: Secondary | ICD-10-CM | POA: Diagnosis not present

## 2017-12-09 DIAGNOSIS — E875 Hyperkalemia: Secondary | ICD-10-CM | POA: Diagnosis not present

## 2017-12-09 DIAGNOSIS — N186 End stage renal disease: Secondary | ICD-10-CM | POA: Diagnosis not present

## 2017-12-11 DIAGNOSIS — N2581 Secondary hyperparathyroidism of renal origin: Secondary | ICD-10-CM | POA: Diagnosis not present

## 2017-12-11 DIAGNOSIS — D631 Anemia in chronic kidney disease: Secondary | ICD-10-CM | POA: Diagnosis not present

## 2017-12-11 DIAGNOSIS — E875 Hyperkalemia: Secondary | ICD-10-CM | POA: Diagnosis not present

## 2017-12-11 DIAGNOSIS — N186 End stage renal disease: Secondary | ICD-10-CM | POA: Diagnosis not present

## 2017-12-13 DIAGNOSIS — I12 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 stage renal disease: Secondary | ICD-10-CM | POA: Diagnosis not present

## 2017-12-13 DIAGNOSIS — E875 Hyperkalemia: Secondary | ICD-10-CM | POA: Diagnosis not present

## 2017-12-13 DIAGNOSIS — N2581 Secondary hyperparathyroidism of renal origin: Secondary | ICD-10-CM | POA: Diagnosis not present

## 2017-12-13 DIAGNOSIS — Z992 Dependence on renal dialysis: Secondary | ICD-10-CM | POA: Diagnosis not present

## 2017-12-13 DIAGNOSIS — N186 End stage renal disease: Secondary | ICD-10-CM | POA: Diagnosis not present

## 2017-12-13 DIAGNOSIS — D631 Anemia in chronic kidney disease: Secondary | ICD-10-CM | POA: Diagnosis not present

## 2017-12-16 DIAGNOSIS — N186 End stage renal disease: Secondary | ICD-10-CM | POA: Diagnosis not present

## 2017-12-16 DIAGNOSIS — E875 Hyperkalemia: Secondary | ICD-10-CM | POA: Diagnosis not present

## 2017-12-16 DIAGNOSIS — N2581 Secondary hyperparathyroidism of renal origin: Secondary | ICD-10-CM | POA: Diagnosis not present

## 2017-12-16 DIAGNOSIS — D631 Anemia in chronic kidney disease: Secondary | ICD-10-CM | POA: Diagnosis not present

## 2017-12-18 DIAGNOSIS — N186 End stage renal disease: Secondary | ICD-10-CM | POA: Diagnosis not present

## 2017-12-18 DIAGNOSIS — E875 Hyperkalemia: Secondary | ICD-10-CM | POA: Diagnosis not present

## 2017-12-18 DIAGNOSIS — D631 Anemia in chronic kidney disease: Secondary | ICD-10-CM | POA: Diagnosis not present

## 2017-12-18 DIAGNOSIS — N2581 Secondary hyperparathyroidism of renal origin: Secondary | ICD-10-CM | POA: Diagnosis not present

## 2017-12-20 DIAGNOSIS — E875 Hyperkalemia: Secondary | ICD-10-CM | POA: Diagnosis not present

## 2017-12-20 DIAGNOSIS — D631 Anemia in chronic kidney disease: Secondary | ICD-10-CM | POA: Diagnosis not present

## 2017-12-20 DIAGNOSIS — N186 End stage renal disease: Secondary | ICD-10-CM | POA: Diagnosis not present

## 2017-12-20 DIAGNOSIS — N2581 Secondary hyperparathyroidism of renal origin: Secondary | ICD-10-CM | POA: Diagnosis not present

## 2017-12-23 DIAGNOSIS — N186 End stage renal disease: Secondary | ICD-10-CM | POA: Diagnosis not present

## 2017-12-23 DIAGNOSIS — N2581 Secondary hyperparathyroidism of renal origin: Secondary | ICD-10-CM | POA: Diagnosis not present

## 2017-12-23 DIAGNOSIS — D631 Anemia in chronic kidney disease: Secondary | ICD-10-CM | POA: Diagnosis not present

## 2017-12-23 DIAGNOSIS — E875 Hyperkalemia: Secondary | ICD-10-CM | POA: Diagnosis not present

## 2017-12-24 DIAGNOSIS — C642 Malignant neoplasm of left kidney, except renal pelvis: Secondary | ICD-10-CM | POA: Insufficient documentation

## 2017-12-25 DIAGNOSIS — N186 End stage renal disease: Secondary | ICD-10-CM | POA: Diagnosis not present

## 2017-12-25 DIAGNOSIS — Z961 Presence of intraocular lens: Secondary | ICD-10-CM | POA: Diagnosis not present

## 2017-12-25 DIAGNOSIS — H47323 Drusen of optic disc, bilateral: Secondary | ICD-10-CM | POA: Diagnosis not present

## 2017-12-25 DIAGNOSIS — D631 Anemia in chronic kidney disease: Secondary | ICD-10-CM | POA: Diagnosis not present

## 2017-12-25 DIAGNOSIS — R7303 Prediabetes: Secondary | ICD-10-CM | POA: Diagnosis not present

## 2017-12-25 DIAGNOSIS — N2581 Secondary hyperparathyroidism of renal origin: Secondary | ICD-10-CM | POA: Diagnosis not present

## 2017-12-25 DIAGNOSIS — H17822 Peripheral opacity of cornea, left eye: Secondary | ICD-10-CM | POA: Diagnosis not present

## 2017-12-25 DIAGNOSIS — E875 Hyperkalemia: Secondary | ICD-10-CM | POA: Diagnosis not present

## 2017-12-27 DIAGNOSIS — D631 Anemia in chronic kidney disease: Secondary | ICD-10-CM | POA: Diagnosis not present

## 2017-12-27 DIAGNOSIS — N2581 Secondary hyperparathyroidism of renal origin: Secondary | ICD-10-CM | POA: Diagnosis not present

## 2017-12-27 DIAGNOSIS — E875 Hyperkalemia: Secondary | ICD-10-CM | POA: Diagnosis not present

## 2017-12-27 DIAGNOSIS — N186 End stage renal disease: Secondary | ICD-10-CM | POA: Diagnosis not present

## 2017-12-30 DIAGNOSIS — N186 End stage renal disease: Secondary | ICD-10-CM | POA: Diagnosis not present

## 2017-12-30 DIAGNOSIS — D631 Anemia in chronic kidney disease: Secondary | ICD-10-CM | POA: Diagnosis not present

## 2017-12-30 DIAGNOSIS — E875 Hyperkalemia: Secondary | ICD-10-CM | POA: Diagnosis not present

## 2017-12-30 DIAGNOSIS — N2581 Secondary hyperparathyroidism of renal origin: Secondary | ICD-10-CM | POA: Diagnosis not present

## 2018-01-01 DIAGNOSIS — D631 Anemia in chronic kidney disease: Secondary | ICD-10-CM | POA: Diagnosis not present

## 2018-01-01 DIAGNOSIS — N2581 Secondary hyperparathyroidism of renal origin: Secondary | ICD-10-CM | POA: Diagnosis not present

## 2018-01-01 DIAGNOSIS — E875 Hyperkalemia: Secondary | ICD-10-CM | POA: Diagnosis not present

## 2018-01-01 DIAGNOSIS — N186 End stage renal disease: Secondary | ICD-10-CM | POA: Diagnosis not present

## 2018-01-03 DIAGNOSIS — D631 Anemia in chronic kidney disease: Secondary | ICD-10-CM | POA: Diagnosis not present

## 2018-01-03 DIAGNOSIS — N2581 Secondary hyperparathyroidism of renal origin: Secondary | ICD-10-CM | POA: Diagnosis not present

## 2018-01-03 DIAGNOSIS — E875 Hyperkalemia: Secondary | ICD-10-CM | POA: Diagnosis not present

## 2018-01-03 DIAGNOSIS — N186 End stage renal disease: Secondary | ICD-10-CM | POA: Diagnosis not present

## 2018-01-05 DIAGNOSIS — E875 Hyperkalemia: Secondary | ICD-10-CM | POA: Diagnosis not present

## 2018-01-05 DIAGNOSIS — D631 Anemia in chronic kidney disease: Secondary | ICD-10-CM | POA: Diagnosis not present

## 2018-01-05 DIAGNOSIS — N2581 Secondary hyperparathyroidism of renal origin: Secondary | ICD-10-CM | POA: Diagnosis not present

## 2018-01-05 DIAGNOSIS — N186 End stage renal disease: Secondary | ICD-10-CM | POA: Diagnosis not present

## 2018-01-07 DIAGNOSIS — N2581 Secondary hyperparathyroidism of renal origin: Secondary | ICD-10-CM | POA: Diagnosis not present

## 2018-01-07 DIAGNOSIS — E875 Hyperkalemia: Secondary | ICD-10-CM | POA: Diagnosis not present

## 2018-01-07 DIAGNOSIS — D631 Anemia in chronic kidney disease: Secondary | ICD-10-CM | POA: Diagnosis not present

## 2018-01-07 DIAGNOSIS — N186 End stage renal disease: Secondary | ICD-10-CM | POA: Diagnosis not present

## 2018-01-10 DIAGNOSIS — D631 Anemia in chronic kidney disease: Secondary | ICD-10-CM | POA: Diagnosis not present

## 2018-01-10 DIAGNOSIS — E875 Hyperkalemia: Secondary | ICD-10-CM | POA: Diagnosis not present

## 2018-01-10 DIAGNOSIS — N186 End stage renal disease: Secondary | ICD-10-CM | POA: Diagnosis not present

## 2018-01-10 DIAGNOSIS — N2581 Secondary hyperparathyroidism of renal origin: Secondary | ICD-10-CM | POA: Diagnosis not present

## 2018-01-12 DIAGNOSIS — I12 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 stage renal disease: Secondary | ICD-10-CM | POA: Diagnosis not present

## 2018-01-12 DIAGNOSIS — N186 End stage renal disease: Secondary | ICD-10-CM | POA: Diagnosis not present

## 2018-01-12 DIAGNOSIS — Z992 Dependence on renal dialysis: Secondary | ICD-10-CM | POA: Diagnosis not present

## 2018-01-13 DIAGNOSIS — D631 Anemia in chronic kidney disease: Secondary | ICD-10-CM | POA: Diagnosis not present

## 2018-01-13 DIAGNOSIS — E875 Hyperkalemia: Secondary | ICD-10-CM | POA: Diagnosis not present

## 2018-01-13 DIAGNOSIS — N2581 Secondary hyperparathyroidism of renal origin: Secondary | ICD-10-CM | POA: Diagnosis not present

## 2018-01-13 DIAGNOSIS — N186 End stage renal disease: Secondary | ICD-10-CM | POA: Diagnosis not present

## 2018-01-14 ENCOUNTER — Encounter: Payer: Self-pay | Admitting: Internal Medicine

## 2018-01-17 DIAGNOSIS — E875 Hyperkalemia: Secondary | ICD-10-CM | POA: Diagnosis not present

## 2018-01-17 DIAGNOSIS — N2581 Secondary hyperparathyroidism of renal origin: Secondary | ICD-10-CM | POA: Diagnosis not present

## 2018-01-17 DIAGNOSIS — D631 Anemia in chronic kidney disease: Secondary | ICD-10-CM | POA: Diagnosis not present

## 2018-01-17 DIAGNOSIS — N186 End stage renal disease: Secondary | ICD-10-CM | POA: Diagnosis not present

## 2018-01-20 DIAGNOSIS — N2581 Secondary hyperparathyroidism of renal origin: Secondary | ICD-10-CM | POA: Diagnosis not present

## 2018-01-20 DIAGNOSIS — E875 Hyperkalemia: Secondary | ICD-10-CM | POA: Diagnosis not present

## 2018-01-20 DIAGNOSIS — D631 Anemia in chronic kidney disease: Secondary | ICD-10-CM | POA: Diagnosis not present

## 2018-01-20 DIAGNOSIS — N186 End stage renal disease: Secondary | ICD-10-CM | POA: Diagnosis not present

## 2018-01-22 ENCOUNTER — Telehealth: Payer: Self-pay | Admitting: *Deleted

## 2018-01-22 DIAGNOSIS — N2581 Secondary hyperparathyroidism of renal origin: Secondary | ICD-10-CM | POA: Diagnosis not present

## 2018-01-22 DIAGNOSIS — N186 End stage renal disease: Secondary | ICD-10-CM | POA: Diagnosis not present

## 2018-01-22 DIAGNOSIS — D631 Anemia in chronic kidney disease: Secondary | ICD-10-CM | POA: Diagnosis not present

## 2018-01-22 DIAGNOSIS — E875 Hyperkalemia: Secondary | ICD-10-CM | POA: Diagnosis not present

## 2018-01-22 NOTE — Telephone Encounter (Signed)
I think ok for previsit and if no alarming complaints at that visit then straight to procedure However, if issues arise at previsit have low-threshold for APP visit or visit with me before colonoscopy Thanks

## 2018-01-22 NOTE — Telephone Encounter (Signed)
Dr Hilarie Fredrickson, This pt is on the schedule for a recall colon with you 02-18-2018,. Tuesday , the referral states for diarrhea.  He saw you back 08-04-2015 while inpatient at [facility]- you performed a colon and he had a large ascending mass- he had a colectomy 08-05-2015 with a 1 yr recall colon.  He never had that done.  He saw Darrell Jewel several times 12-2016 for Diarrhea-   He has  a hx of  ERSD, on dialysis, diverticulitis, Severe OSA, seizure last documented 12-02-2015.   He has a bilateral Nephrectomy at [facility] 11-29-2017. He is still on the Kidney transplant list.     Is he ok to proceed with a colon in the [facility] 02-18-2018, or does he need an OV.    Please advise, Thanks so much,  Lelan Pons

## 2018-01-24 DIAGNOSIS — D631 Anemia in chronic kidney disease: Secondary | ICD-10-CM | POA: Diagnosis not present

## 2018-01-24 DIAGNOSIS — N2581 Secondary hyperparathyroidism of renal origin: Secondary | ICD-10-CM | POA: Diagnosis not present

## 2018-01-24 DIAGNOSIS — E875 Hyperkalemia: Secondary | ICD-10-CM | POA: Diagnosis not present

## 2018-01-24 DIAGNOSIS — N186 End stage renal disease: Secondary | ICD-10-CM | POA: Diagnosis not present

## 2018-01-27 DIAGNOSIS — E875 Hyperkalemia: Secondary | ICD-10-CM | POA: Diagnosis not present

## 2018-01-27 DIAGNOSIS — D631 Anemia in chronic kidney disease: Secondary | ICD-10-CM | POA: Diagnosis not present

## 2018-01-27 DIAGNOSIS — N186 End stage renal disease: Secondary | ICD-10-CM | POA: Diagnosis not present

## 2018-01-27 DIAGNOSIS — N2581 Secondary hyperparathyroidism of renal origin: Secondary | ICD-10-CM | POA: Diagnosis not present

## 2018-01-29 DIAGNOSIS — D631 Anemia in chronic kidney disease: Secondary | ICD-10-CM | POA: Diagnosis not present

## 2018-01-29 DIAGNOSIS — N2581 Secondary hyperparathyroidism of renal origin: Secondary | ICD-10-CM | POA: Diagnosis not present

## 2018-01-29 DIAGNOSIS — N186 End stage renal disease: Secondary | ICD-10-CM | POA: Diagnosis not present

## 2018-01-29 DIAGNOSIS — E875 Hyperkalemia: Secondary | ICD-10-CM | POA: Diagnosis not present

## 2018-01-30 DIAGNOSIS — IMO0001 Reserved for inherently not codable concepts without codable children: Secondary | ICD-10-CM | POA: Insufficient documentation

## 2018-01-30 DIAGNOSIS — Z789 Other specified health status: Secondary | ICD-10-CM | POA: Insufficient documentation

## 2018-01-31 DIAGNOSIS — N2581 Secondary hyperparathyroidism of renal origin: Secondary | ICD-10-CM | POA: Diagnosis not present

## 2018-01-31 DIAGNOSIS — E875 Hyperkalemia: Secondary | ICD-10-CM | POA: Diagnosis not present

## 2018-01-31 DIAGNOSIS — D631 Anemia in chronic kidney disease: Secondary | ICD-10-CM | POA: Diagnosis not present

## 2018-01-31 DIAGNOSIS — N186 End stage renal disease: Secondary | ICD-10-CM | POA: Diagnosis not present

## 2018-02-02 DIAGNOSIS — N2581 Secondary hyperparathyroidism of renal origin: Secondary | ICD-10-CM | POA: Diagnosis not present

## 2018-02-02 DIAGNOSIS — E875 Hyperkalemia: Secondary | ICD-10-CM | POA: Diagnosis not present

## 2018-02-02 DIAGNOSIS — D631 Anemia in chronic kidney disease: Secondary | ICD-10-CM | POA: Diagnosis not present

## 2018-02-02 DIAGNOSIS — N186 End stage renal disease: Secondary | ICD-10-CM | POA: Diagnosis not present

## 2018-02-04 DIAGNOSIS — D631 Anemia in chronic kidney disease: Secondary | ICD-10-CM | POA: Diagnosis not present

## 2018-02-04 DIAGNOSIS — E875 Hyperkalemia: Secondary | ICD-10-CM | POA: Diagnosis not present

## 2018-02-04 DIAGNOSIS — N2581 Secondary hyperparathyroidism of renal origin: Secondary | ICD-10-CM | POA: Diagnosis not present

## 2018-02-04 DIAGNOSIS — N186 End stage renal disease: Secondary | ICD-10-CM | POA: Diagnosis not present

## 2018-02-07 DIAGNOSIS — R7301 Impaired fasting glucose: Secondary | ICD-10-CM | POA: Diagnosis not present

## 2018-02-07 DIAGNOSIS — N186 End stage renal disease: Secondary | ICD-10-CM | POA: Diagnosis not present

## 2018-02-07 DIAGNOSIS — Z0001 Encounter for general adult medical examination with abnormal findings: Secondary | ICD-10-CM | POA: Diagnosis not present

## 2018-02-07 DIAGNOSIS — Z992 Dependence on renal dialysis: Secondary | ICD-10-CM | POA: Diagnosis not present

## 2018-02-07 DIAGNOSIS — G63 Polyneuropathy in diseases classified elsewhere: Secondary | ICD-10-CM | POA: Diagnosis not present

## 2018-02-07 DIAGNOSIS — E7800 Pure hypercholesterolemia, unspecified: Secondary | ICD-10-CM | POA: Diagnosis not present

## 2018-02-09 DIAGNOSIS — N186 End stage renal disease: Secondary | ICD-10-CM | POA: Diagnosis not present

## 2018-02-09 DIAGNOSIS — N2581 Secondary hyperparathyroidism of renal origin: Secondary | ICD-10-CM | POA: Diagnosis not present

## 2018-02-09 DIAGNOSIS — E875 Hyperkalemia: Secondary | ICD-10-CM | POA: Diagnosis not present

## 2018-02-09 DIAGNOSIS — D631 Anemia in chronic kidney disease: Secondary | ICD-10-CM | POA: Diagnosis not present

## 2018-02-11 DIAGNOSIS — N186 End stage renal disease: Secondary | ICD-10-CM | POA: Diagnosis not present

## 2018-02-11 DIAGNOSIS — D631 Anemia in chronic kidney disease: Secondary | ICD-10-CM | POA: Diagnosis not present

## 2018-02-11 DIAGNOSIS — N2581 Secondary hyperparathyroidism of renal origin: Secondary | ICD-10-CM | POA: Diagnosis not present

## 2018-02-11 DIAGNOSIS — E875 Hyperkalemia: Secondary | ICD-10-CM | POA: Diagnosis not present

## 2018-02-12 DIAGNOSIS — I12 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 stage renal disease: Secondary | ICD-10-CM | POA: Diagnosis not present

## 2018-02-12 DIAGNOSIS — Z992 Dependence on renal dialysis: Secondary | ICD-10-CM | POA: Diagnosis not present

## 2018-02-12 DIAGNOSIS — N186 End stage renal disease: Secondary | ICD-10-CM | POA: Diagnosis not present

## 2018-02-14 DIAGNOSIS — N2581 Secondary hyperparathyroidism of renal origin: Secondary | ICD-10-CM | POA: Diagnosis not present

## 2018-02-14 DIAGNOSIS — N186 End stage renal disease: Secondary | ICD-10-CM | POA: Diagnosis not present

## 2018-02-14 DIAGNOSIS — E875 Hyperkalemia: Secondary | ICD-10-CM | POA: Diagnosis not present

## 2018-02-14 DIAGNOSIS — D631 Anemia in chronic kidney disease: Secondary | ICD-10-CM | POA: Diagnosis not present

## 2018-02-17 DIAGNOSIS — E875 Hyperkalemia: Secondary | ICD-10-CM | POA: Diagnosis not present

## 2018-02-17 DIAGNOSIS — N186 End stage renal disease: Secondary | ICD-10-CM | POA: Diagnosis not present

## 2018-02-17 DIAGNOSIS — D631 Anemia in chronic kidney disease: Secondary | ICD-10-CM | POA: Diagnosis not present

## 2018-02-17 DIAGNOSIS — N2581 Secondary hyperparathyroidism of renal origin: Secondary | ICD-10-CM | POA: Diagnosis not present

## 2018-02-18 ENCOUNTER — Encounter: Payer: Medicare Other | Admitting: Internal Medicine

## 2018-02-19 DIAGNOSIS — D631 Anemia in chronic kidney disease: Secondary | ICD-10-CM | POA: Diagnosis not present

## 2018-02-19 DIAGNOSIS — E875 Hyperkalemia: Secondary | ICD-10-CM | POA: Diagnosis not present

## 2018-02-19 DIAGNOSIS — N186 End stage renal disease: Secondary | ICD-10-CM | POA: Diagnosis not present

## 2018-02-19 DIAGNOSIS — N2581 Secondary hyperparathyroidism of renal origin: Secondary | ICD-10-CM | POA: Diagnosis not present

## 2018-02-21 DIAGNOSIS — D631 Anemia in chronic kidney disease: Secondary | ICD-10-CM | POA: Diagnosis not present

## 2018-02-21 DIAGNOSIS — N186 End stage renal disease: Secondary | ICD-10-CM | POA: Diagnosis not present

## 2018-02-21 DIAGNOSIS — E875 Hyperkalemia: Secondary | ICD-10-CM | POA: Diagnosis not present

## 2018-02-21 DIAGNOSIS — N2581 Secondary hyperparathyroidism of renal origin: Secondary | ICD-10-CM | POA: Diagnosis not present

## 2018-02-24 DIAGNOSIS — N2581 Secondary hyperparathyroidism of renal origin: Secondary | ICD-10-CM | POA: Diagnosis not present

## 2018-02-24 DIAGNOSIS — N186 End stage renal disease: Secondary | ICD-10-CM | POA: Diagnosis not present

## 2018-02-24 DIAGNOSIS — D631 Anemia in chronic kidney disease: Secondary | ICD-10-CM | POA: Diagnosis not present

## 2018-02-24 DIAGNOSIS — E875 Hyperkalemia: Secondary | ICD-10-CM | POA: Diagnosis not present

## 2018-02-26 DIAGNOSIS — N186 End stage renal disease: Secondary | ICD-10-CM | POA: Diagnosis not present

## 2018-02-26 DIAGNOSIS — E875 Hyperkalemia: Secondary | ICD-10-CM | POA: Diagnosis not present

## 2018-02-26 DIAGNOSIS — D631 Anemia in chronic kidney disease: Secondary | ICD-10-CM | POA: Diagnosis not present

## 2018-02-26 DIAGNOSIS — N2581 Secondary hyperparathyroidism of renal origin: Secondary | ICD-10-CM | POA: Diagnosis not present

## 2018-02-28 DIAGNOSIS — N186 End stage renal disease: Secondary | ICD-10-CM | POA: Diagnosis not present

## 2018-02-28 DIAGNOSIS — E875 Hyperkalemia: Secondary | ICD-10-CM | POA: Diagnosis not present

## 2018-02-28 DIAGNOSIS — D631 Anemia in chronic kidney disease: Secondary | ICD-10-CM | POA: Diagnosis not present

## 2018-02-28 DIAGNOSIS — N2581 Secondary hyperparathyroidism of renal origin: Secondary | ICD-10-CM | POA: Diagnosis not present

## 2018-03-03 DIAGNOSIS — E875 Hyperkalemia: Secondary | ICD-10-CM | POA: Diagnosis not present

## 2018-03-03 DIAGNOSIS — N2581 Secondary hyperparathyroidism of renal origin: Secondary | ICD-10-CM | POA: Diagnosis not present

## 2018-03-03 DIAGNOSIS — N186 End stage renal disease: Secondary | ICD-10-CM | POA: Diagnosis not present

## 2018-03-03 DIAGNOSIS — D631 Anemia in chronic kidney disease: Secondary | ICD-10-CM | POA: Diagnosis not present

## 2018-03-05 DIAGNOSIS — N2581 Secondary hyperparathyroidism of renal origin: Secondary | ICD-10-CM | POA: Diagnosis not present

## 2018-03-05 DIAGNOSIS — E1129 Type 2 diabetes mellitus with other diabetic kidney complication: Secondary | ICD-10-CM | POA: Diagnosis not present

## 2018-03-05 DIAGNOSIS — D631 Anemia in chronic kidney disease: Secondary | ICD-10-CM | POA: Diagnosis not present

## 2018-03-05 DIAGNOSIS — N186 End stage renal disease: Secondary | ICD-10-CM | POA: Diagnosis not present

## 2018-03-05 DIAGNOSIS — E875 Hyperkalemia: Secondary | ICD-10-CM | POA: Diagnosis not present

## 2018-03-07 DIAGNOSIS — N2581 Secondary hyperparathyroidism of renal origin: Secondary | ICD-10-CM | POA: Diagnosis not present

## 2018-03-07 DIAGNOSIS — E875 Hyperkalemia: Secondary | ICD-10-CM | POA: Diagnosis not present

## 2018-03-07 DIAGNOSIS — D631 Anemia in chronic kidney disease: Secondary | ICD-10-CM | POA: Diagnosis not present

## 2018-03-07 DIAGNOSIS — N186 End stage renal disease: Secondary | ICD-10-CM | POA: Diagnosis not present

## 2018-03-12 ENCOUNTER — Emergency Department (HOSPITAL_COMMUNITY): Payer: Medicare Other

## 2018-03-12 ENCOUNTER — Other Ambulatory Visit: Payer: Self-pay

## 2018-03-12 ENCOUNTER — Non-Acute Institutional Stay (HOSPITAL_COMMUNITY)
Admission: EM | Admit: 2018-03-12 | Discharge: 2018-03-12 | Disposition: A | Discharge status: Home / Self Care | Payer: Medicare Other | Attending: Emergency Medicine | Admitting: Emergency Medicine

## 2018-03-12 ENCOUNTER — Encounter (HOSPITAL_COMMUNITY): Payer: Self-pay | Admitting: Emergency Medicine

## 2018-03-12 DIAGNOSIS — E785 Hyperlipidemia, unspecified: Secondary | ICD-10-CM | POA: Diagnosis not present

## 2018-03-12 DIAGNOSIS — Z8249 Family history of ischemic heart disease and other diseases of the circulatory system: Secondary | ICD-10-CM | POA: Diagnosis not present

## 2018-03-12 DIAGNOSIS — Z87891 Personal history of nicotine dependence: Secondary | ICD-10-CM | POA: Diagnosis not present

## 2018-03-12 DIAGNOSIS — Z823 Family history of stroke: Secondary | ICD-10-CM | POA: Diagnosis not present

## 2018-03-12 DIAGNOSIS — R9431 Abnormal electrocardiogram [ECG] [EKG]: Secondary | ICD-10-CM | POA: Diagnosis not present

## 2018-03-12 DIAGNOSIS — R531 Weakness: Secondary | ICD-10-CM | POA: Diagnosis not present

## 2018-03-12 DIAGNOSIS — I12 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 stage renal disease: Secondary | ICD-10-CM | POA: Insufficient documentation

## 2018-03-12 DIAGNOSIS — Z885 Allergy status to narcotic agent status: Secondary | ICD-10-CM | POA: Insufficient documentation

## 2018-03-12 DIAGNOSIS — K219 Gastro-esophageal reflux disease without esophagitis: Secondary | ICD-10-CM | POA: Diagnosis not present

## 2018-03-12 DIAGNOSIS — I499 Cardiac arrhythmia, unspecified: Secondary | ICD-10-CM | POA: Diagnosis not present

## 2018-03-12 DIAGNOSIS — R251 Tremor, unspecified: Secondary | ICD-10-CM | POA: Diagnosis present

## 2018-03-12 DIAGNOSIS — Z79899 Other long term (current) drug therapy: Secondary | ICD-10-CM | POA: Insufficient documentation

## 2018-03-12 DIAGNOSIS — Z992 Dependence on renal dialysis: Secondary | ICD-10-CM | POA: Diagnosis not present

## 2018-03-12 DIAGNOSIS — Z7982 Long term (current) use of aspirin: Secondary | ICD-10-CM | POA: Diagnosis not present

## 2018-03-12 DIAGNOSIS — Z833 Family history of diabetes mellitus: Secondary | ICD-10-CM | POA: Diagnosis not present

## 2018-03-12 DIAGNOSIS — Z791 Long term (current) use of non-steroidal anti-inflammatories (NSAID): Secondary | ICD-10-CM | POA: Diagnosis not present

## 2018-03-12 DIAGNOSIS — I10 Essential (primary) hypertension: Secondary | ICD-10-CM | POA: Diagnosis not present

## 2018-03-12 DIAGNOSIS — R4020 Unspecified coma: Secondary | ICD-10-CM | POA: Diagnosis not present

## 2018-03-12 DIAGNOSIS — E875 Hyperkalemia: Secondary | ICD-10-CM | POA: Diagnosis not present

## 2018-03-12 DIAGNOSIS — N186 End stage renal disease: Secondary | ICD-10-CM | POA: Insufficient documentation

## 2018-03-12 DIAGNOSIS — N2581 Secondary hyperparathyroidism of renal origin: Secondary | ICD-10-CM | POA: Insufficient documentation

## 2018-03-12 DIAGNOSIS — I447 Left bundle-branch block, unspecified: Secondary | ICD-10-CM | POA: Diagnosis not present

## 2018-03-12 DIAGNOSIS — Z8673 Personal history of transient ischemic attack (TIA), and cerebral infarction without residual deficits: Secondary | ICD-10-CM | POA: Insufficient documentation

## 2018-03-12 DIAGNOSIS — R569 Unspecified convulsions: Secondary | ICD-10-CM | POA: Diagnosis not present

## 2018-03-12 DIAGNOSIS — R7303 Prediabetes: Secondary | ICD-10-CM | POA: Insufficient documentation

## 2018-03-12 DIAGNOSIS — G4733 Obstructive sleep apnea (adult) (pediatric): Secondary | ICD-10-CM | POA: Insufficient documentation

## 2018-03-12 LAB — CBC WITH DIFFERENTIAL/PLATELET
Abs Immature Granulocytes: 0.01 10*3/uL (ref 0.00–0.07)
Basophils Absolute: 0.1 10*3/uL (ref 0.0–0.1)
Basophils Relative: 1 %
Eosinophils Absolute: 1.8 10*3/uL — ABNORMAL HIGH (ref 0.0–0.5)
Eosinophils Relative: 21 %
HCT: 32.9 % — ABNORMAL LOW (ref 39.0–52.0)
Hemoglobin: 10.2 g/dL — ABNORMAL LOW (ref 13.0–17.0)
Immature Granulocytes: 0 %
Lymphocytes Relative: 8 %
Lymphs Abs: 0.7 10*3/uL (ref 0.7–4.0)
MCH: 28.1 pg (ref 26.0–34.0)
MCHC: 31 g/dL (ref 30.0–36.0)
MCV: 90.6 fL (ref 80.0–100.0)
Monocytes Absolute: 0.6 10*3/uL (ref 0.1–1.0)
Monocytes Relative: 7 %
Neutro Abs: 5.5 10*3/uL (ref 1.7–7.7)
Neutrophils Relative %: 63 %
Platelets: 206 10*3/uL (ref 150–400)
RBC: 3.63 MIL/uL — ABNORMAL LOW (ref 4.22–5.81)
RDW: 15.5 % (ref 11.5–15.5)
WBC: 8.7 10*3/uL (ref 4.0–10.5)
nRBC: 0 % (ref 0.0–0.2)

## 2018-03-12 LAB — BASIC METABOLIC PANEL
Anion gap: 21 — ABNORMAL HIGH (ref 5–15)
Anion gap: 17 — ABNORMAL HIGH (ref 5–15)
BUN: 99 mg/dL — ABNORMAL HIGH (ref 6–20)
BUN: 98 mg/dL — ABNORMAL HIGH (ref 6–20)
CO2: 15 mmol/L — ABNORMAL LOW (ref 22–32)
CO2: 19 mmol/L — ABNORMAL LOW (ref 22–32)
Calcium: 8.8 mg/dL — ABNORMAL LOW (ref 8.9–10.3)
Calcium: 9 mg/dL (ref 8.9–10.3)
Chloride: 99 mmol/L (ref 98–111)
Chloride: 101 mmol/L (ref 98–111)
Creatinine, Ser: 15.88 mg/dL — ABNORMAL HIGH (ref 0.61–1.24)
Creatinine, Ser: 16.45 mg/dL — ABNORMAL HIGH (ref 0.61–1.24)
GFR calc Af Amer: 3 mL/min — ABNORMAL LOW (ref >60)
GFR calc Af Amer: 3 mL/min — ABNORMAL LOW (ref >60)
GFR calc non Af Amer: 3 mL/min — ABNORMAL LOW (ref >60)
GFR calc non Af Amer: 3 mL/min — ABNORMAL LOW (ref >60)
Glucose, Bld: 89 mg/dL (ref 70–99)
Glucose, Bld: 58 mg/dL — ABNORMAL LOW (ref 70–99)
Potassium: 7.4 mmol/L (ref 3.5–5.1)
Potassium: 6.1 mmol/L — ABNORMAL HIGH (ref 3.5–5.1)
Sodium: 135 mmol/L (ref 135–145)
Sodium: 137 mmol/L (ref 135–145)

## 2018-03-12 LAB — ETHANOL: Alcohol, Ethyl (B): <10 mg/dL (ref <10)

## 2018-03-12 LAB — CBG MONITORING, ED: Glucose-Capillary: 85 mg/dL (ref 70–99)

## 2018-03-12 MED ORDER — ALTEPLASE 2 MG IJ SOLR: 2.0000 mg | Freq: Once | INTRAMUSCULAR | Status: DC | PRN

## 2018-03-12 MED ORDER — LIDOCAINE-PRILOCAINE 2.5-2.5 % EX CREA
1.0000 "application " | TOPICAL_CREAM | CUTANEOUS | Status: DC | PRN
  Filled 2018-03-12: qty 5

## 2018-03-12 MED ORDER — CHLORHEXIDINE GLUCONATE CLOTH 2 % EX PADS: 6.0000 | MEDICATED_PAD | Freq: Every day | CUTANEOUS | Status: DC

## 2018-03-12 MED ORDER — SODIUM CHLORIDE 0.9 % IV SOLN: 100.0000 mL | INTRAVENOUS | Status: DC | PRN

## 2018-03-12 MED ORDER — PENTAFLUOROPROP-TETRAFLUOROETH EX AERO
1.0000 "application " | INHALATION_SPRAY | CUTANEOUS | Status: DC | PRN
  Filled 2018-03-12: qty 116

## 2018-03-12 MED ORDER — DOXERCALCIFEROL 4 MCG/2ML IV SOLN
9.0000 ug | INTRAVENOUS | Status: DC
  Administered 2018-03-12: 9 ug via INTRAVENOUS
  Filled 2018-03-12: qty 6

## 2018-03-12 MED ORDER — DOXERCALCIFEROL 4 MCG/2ML IV SOLN
INTRAVENOUS | Status: AC
  Administered 2018-03-12: 9 ug via INTRAVENOUS
  Filled 2018-03-12: qty 6

## 2018-03-12 MED ORDER — LIDOCAINE HCL (PF) 1 % IJ SOLN: 5.0000 mL | INTRAMUSCULAR | Status: DC | PRN

## 2018-03-12 MED ORDER — DARBEPOETIN ALFA 100 MCG/0.5ML IJ SOSY
PREFILLED_SYRINGE | INTRAMUSCULAR | Status: AC
  Administered 2018-03-12: 100 ug via INTRAVENOUS
  Filled 2018-03-12: qty 0.5

## 2018-03-12 MED ORDER — DEXTROSE 50 % IV SOLN
1.0000 | Freq: Once | INTRAVENOUS | Status: AC
  Administered 2018-03-12: 50 mL via INTRAVENOUS
  Filled 2018-03-12: qty 50

## 2018-03-12 MED ORDER — HEPARIN SODIUM (PORCINE) 1000 UNIT/ML DIALYSIS
1000.0000 [IU] | INTRAMUSCULAR | Status: DC | PRN
  Filled 2018-03-12: qty 1

## 2018-03-12 MED ORDER — DARBEPOETIN ALFA 100 MCG/0.5ML IJ SOSY
100.0000 ug | PREFILLED_SYRINGE | INTRAMUSCULAR | Status: DC
  Administered 2018-03-12: 100 ug via INTRAVENOUS
  Filled 2018-03-12: qty 0.5

## 2018-03-12 MED ORDER — ACETAMINOPHEN 325 MG PO TABS
650.0000 mg | ORAL_TABLET | Freq: Once | ORAL | Status: AC
  Administered 2018-03-12: 650 mg via ORAL
  Filled 2018-03-12: qty 2

## 2018-03-12 MED ORDER — CALCIUM GLUCONATE-NACL 1-0.675 GM/50ML-% IV SOLN
1.0000 g | Freq: Once | INTRAVENOUS | Status: AC
  Administered 2018-03-12: 1000 mg via INTRAVENOUS
  Filled 2018-03-12: qty 50

## 2018-03-12 MED ORDER — SODIUM BICARBONATE 8.4 % IV SOLN
50.0000 meq | Freq: Once | INTRAVENOUS | Status: AC
  Administered 2018-03-12: 50 meq via INTRAVENOUS
  Filled 2018-03-12: qty 50

## 2018-03-12 MED ORDER — HEPARIN SODIUM (PORCINE) 1000 UNIT/ML DIALYSIS
20.0000 [IU]/kg | INTRAMUSCULAR | Status: DC | PRN
  Filled 2018-03-12: qty 2

## 2018-03-12 MED ORDER — INSULIN ASPART 100 UNIT/ML IV SOLN
5.0000 [IU] | Freq: Once | INTRAVENOUS | Status: AC
  Administered 2018-03-12: 5 [IU] via INTRAVENOUS

## 2018-03-12 NOTE — ED Triage Notes (Signed)
Pt in from home via [facility] after witnessed seizure. Per EMS, pt was getting dressed for dialysis and began having tremors, brother states he saw seizure-like activity for 5-10 min. Pt denies any LOC, tongue intact, no incontinence or injuries. Arrives a&ox4, denies seizure hx. Missed dialysis Mon, last session on Fri

## 2018-03-12 NOTE — Progress Notes (Signed)
Pt. Discharged home via private vehicle with family after HD treatment pt. Stable alert and oriented pt. Seen by Dr. Joelyn Oms and Alric Seton PA at bedside.

## 2018-03-12 NOTE — Progress Notes (Signed)
    [name]. MCR:[id number] DOB: 02-Sep-1960 DOA: 03/12/2018  PCP: [name], MD Consultants:  Stann Mainland - transplant; Amalia Hailey- urology; Donzetta Matters - vascular; Erlinda Hong - neurology; Gerkin - surgery  Chief Complaint:  Shaking  HPI: [name]. is a 58 y.o. male with medical history significant of CVA; OSA not on CPAP; seizures; ESRD on MWF HD; HTN; and borderline DM presenting with shaking.  ED Course:  Missed Monday HD.  Having shaking all over - remembers, not post-ictal.  Had prior work-up for seizures but no known h/o seizures.  K+ 7.4 with EKG changes.  Nephrology will dialyze.  Being treated with calcium, bicarb, and insulin.     Patient discussed with Dr. Francia Greaves and then Dr. Joelyn Oms.  At this time, it appears that he primarily needs HD.  Per our discussion, the patient will be discharged from the ER for HD.  If, after HD, he requires admission, [facility] will be happy to admit the patient at that time.   Karmen Bongo MD [facility]   03/12/2018, 9:35 AM

## 2018-03-12 NOTE — Procedures (Signed)
Pt presented to ED today with muscle twitching found to have hyperkalemia.  He says this happens when K is up.  NO other c/o, specifically no F/C,SOB,CP.    Plan for HD here today and if stable ok for DC home.   Starting on 1K bath,  Goal UF 5L  I was present at this dialysis session. I have reviewed the session itself and made appropriate changes.   Filed Weights   03/12/18 0728 03/12/18 1140  Weight: 85.7 kg 91.4 kg    Recent Labs  Lab 03/12/18 0725  NA 135  K 7.4*  CL 99  CO2 15*  GLUCOSE 89  BUN 99*  CREATININE 15.88*  CALCIUM 8.8*    Recent Labs  Lab 03/12/18 0725  WBC 8.7  NEUTROABS 5.5  HGB 10.2*  HCT 32.9*  MCV 90.6  PLT 206    Scheduled Meds: . Chlorhexidine Gluconate Cloth  6 each Topical Q0600  . darbepoetin (ARANESP) injection - DIALYSIS  100 mcg Intravenous Q Wed-HD  . doxercalciferol  9 mcg Intravenous Q M,W,F-HD   Continuous Infusions: . sodium chloride    . sodium chloride     PRN Meds:.sodium chloride, sodium chloride, alteplase, heparin, heparin, lidocaine (PF), lidocaine-prilocaine, pentafluoroprop-tetrafluoroeth   Pearson Grippe  MD 03/12/2018, 12:37 PM
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
## 2018-03-12 NOTE — ED Notes (Signed)
In dialysis
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
## 2018-03-12 NOTE — ED Notes (Signed)
Patient transported to X-ray & CT °

## 2018-03-13 LAB — HEPATITIS B SURFACE ANTIGEN: Hepatitis B Surface Ag: NEGATIVE

## 2018-03-14 DIAGNOSIS — N2581 Secondary hyperparathyroidism of renal origin: Secondary | ICD-10-CM | POA: Diagnosis not present

## 2018-03-14 DIAGNOSIS — D631 Anemia in chronic kidney disease: Secondary | ICD-10-CM | POA: Diagnosis not present

## 2018-03-14 DIAGNOSIS — N186 End stage renal disease: Secondary | ICD-10-CM | POA: Diagnosis not present

## 2018-03-14 DIAGNOSIS — E875 Hyperkalemia: Secondary | ICD-10-CM | POA: Diagnosis not present

## 2018-03-15 DIAGNOSIS — Z992 Dependence on renal dialysis: Secondary | ICD-10-CM | POA: Diagnosis not present

## 2018-03-15 DIAGNOSIS — I12 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 stage renal disease: Secondary | ICD-10-CM | POA: Diagnosis not present

## 2018-03-15 DIAGNOSIS — N186 End stage renal disease: Secondary | ICD-10-CM | POA: Diagnosis not present

## 2018-03-17 DIAGNOSIS — D631 Anemia in chronic kidney disease: Secondary | ICD-10-CM | POA: Diagnosis not present

## 2018-03-17 DIAGNOSIS — N2581 Secondary hyperparathyroidism of renal origin: Secondary | ICD-10-CM | POA: Diagnosis not present

## 2018-03-17 DIAGNOSIS — D509 Iron deficiency anemia, unspecified: Secondary | ICD-10-CM | POA: Diagnosis not present

## 2018-03-17 DIAGNOSIS — E875 Hyperkalemia: Secondary | ICD-10-CM | POA: Diagnosis not present

## 2018-03-17 DIAGNOSIS — N186 End stage renal disease: Secondary | ICD-10-CM | POA: Diagnosis not present

## 2018-03-19 DIAGNOSIS — D631 Anemia in chronic kidney disease: Secondary | ICD-10-CM | POA: Diagnosis not present

## 2018-03-19 DIAGNOSIS — N2581 Secondary hyperparathyroidism of renal origin: Secondary | ICD-10-CM | POA: Diagnosis not present

## 2018-03-19 DIAGNOSIS — E875 Hyperkalemia: Secondary | ICD-10-CM | POA: Diagnosis not present

## 2018-03-19 DIAGNOSIS — N186 End stage renal disease: Secondary | ICD-10-CM | POA: Diagnosis not present

## 2018-03-19 DIAGNOSIS — D509 Iron deficiency anemia, unspecified: Secondary | ICD-10-CM | POA: Diagnosis not present

## 2018-03-21 DIAGNOSIS — D509 Iron deficiency anemia, unspecified: Secondary | ICD-10-CM | POA: Diagnosis not present

## 2018-03-21 DIAGNOSIS — N186 End stage renal disease: Secondary | ICD-10-CM | POA: Diagnosis not present

## 2018-03-21 DIAGNOSIS — D631 Anemia in chronic kidney disease: Secondary | ICD-10-CM | POA: Diagnosis not present

## 2018-03-21 DIAGNOSIS — N2581 Secondary hyperparathyroidism of renal origin: Secondary | ICD-10-CM | POA: Diagnosis not present

## 2018-03-21 DIAGNOSIS — E875 Hyperkalemia: Secondary | ICD-10-CM | POA: Diagnosis not present

## 2018-03-24 DIAGNOSIS — D509 Iron deficiency anemia, unspecified: Secondary | ICD-10-CM | POA: Diagnosis not present

## 2018-03-24 DIAGNOSIS — D631 Anemia in chronic kidney disease: Secondary | ICD-10-CM | POA: Diagnosis not present

## 2018-03-24 DIAGNOSIS — E875 Hyperkalemia: Secondary | ICD-10-CM | POA: Diagnosis not present

## 2018-03-24 DIAGNOSIS — N186 End stage renal disease: Secondary | ICD-10-CM | POA: Diagnosis not present

## 2018-03-24 DIAGNOSIS — N2581 Secondary hyperparathyroidism of renal origin: Secondary | ICD-10-CM | POA: Diagnosis not present

## 2018-03-26 DIAGNOSIS — D631 Anemia in chronic kidney disease: Secondary | ICD-10-CM | POA: Diagnosis not present

## 2018-03-26 DIAGNOSIS — D509 Iron deficiency anemia, unspecified: Secondary | ICD-10-CM | POA: Diagnosis not present

## 2018-03-26 DIAGNOSIS — N186 End stage renal disease: Secondary | ICD-10-CM | POA: Diagnosis not present

## 2018-03-26 DIAGNOSIS — E875 Hyperkalemia: Secondary | ICD-10-CM | POA: Diagnosis not present

## 2018-03-26 DIAGNOSIS — N2581 Secondary hyperparathyroidism of renal origin: Secondary | ICD-10-CM | POA: Diagnosis not present

## 2018-03-28 DIAGNOSIS — N2581 Secondary hyperparathyroidism of renal origin: Secondary | ICD-10-CM | POA: Diagnosis not present

## 2018-03-28 DIAGNOSIS — N186 End stage renal disease: Secondary | ICD-10-CM | POA: Diagnosis not present

## 2018-03-28 DIAGNOSIS — E875 Hyperkalemia: Secondary | ICD-10-CM | POA: Diagnosis not present

## 2018-03-28 DIAGNOSIS — D631 Anemia in chronic kidney disease: Secondary | ICD-10-CM | POA: Diagnosis not present

## 2018-03-28 DIAGNOSIS — D509 Iron deficiency anemia, unspecified: Secondary | ICD-10-CM | POA: Diagnosis not present

## 2018-03-31 DIAGNOSIS — D509 Iron deficiency anemia, unspecified: Secondary | ICD-10-CM | POA: Diagnosis not present

## 2018-03-31 DIAGNOSIS — N2581 Secondary hyperparathyroidism of renal origin: Secondary | ICD-10-CM | POA: Diagnosis not present

## 2018-03-31 DIAGNOSIS — N186 End stage renal disease: Secondary | ICD-10-CM | POA: Diagnosis not present

## 2018-03-31 DIAGNOSIS — E875 Hyperkalemia: Secondary | ICD-10-CM | POA: Diagnosis not present

## 2018-03-31 DIAGNOSIS — D631 Anemia in chronic kidney disease: Secondary | ICD-10-CM | POA: Diagnosis not present

## 2018-04-02 DIAGNOSIS — D631 Anemia in chronic kidney disease: Secondary | ICD-10-CM | POA: Diagnosis not present

## 2018-04-02 DIAGNOSIS — N2581 Secondary hyperparathyroidism of renal origin: Secondary | ICD-10-CM | POA: Diagnosis not present

## 2018-04-02 DIAGNOSIS — D509 Iron deficiency anemia, unspecified: Secondary | ICD-10-CM | POA: Diagnosis not present

## 2018-04-02 DIAGNOSIS — E875 Hyperkalemia: Secondary | ICD-10-CM | POA: Diagnosis not present

## 2018-04-02 DIAGNOSIS — N186 End stage renal disease: Secondary | ICD-10-CM | POA: Diagnosis not present

## 2018-04-04 DIAGNOSIS — D509 Iron deficiency anemia, unspecified: Secondary | ICD-10-CM | POA: Diagnosis not present

## 2018-04-04 DIAGNOSIS — N2581 Secondary hyperparathyroidism of renal origin: Secondary | ICD-10-CM | POA: Diagnosis not present

## 2018-04-04 DIAGNOSIS — E875 Hyperkalemia: Secondary | ICD-10-CM | POA: Diagnosis not present

## 2018-04-04 DIAGNOSIS — D631 Anemia in chronic kidney disease: Secondary | ICD-10-CM | POA: Diagnosis not present

## 2018-04-04 DIAGNOSIS — N186 End stage renal disease: Secondary | ICD-10-CM | POA: Diagnosis not present

## 2018-04-07 DIAGNOSIS — D631 Anemia in chronic kidney disease: Secondary | ICD-10-CM | POA: Diagnosis not present

## 2018-04-07 DIAGNOSIS — D509 Iron deficiency anemia, unspecified: Secondary | ICD-10-CM | POA: Diagnosis not present

## 2018-04-07 DIAGNOSIS — E875 Hyperkalemia: Secondary | ICD-10-CM | POA: Diagnosis not present

## 2018-04-07 DIAGNOSIS — N2581 Secondary hyperparathyroidism of renal origin: Secondary | ICD-10-CM | POA: Diagnosis not present

## 2018-04-07 DIAGNOSIS — N186 End stage renal disease: Secondary | ICD-10-CM | POA: Diagnosis not present

## 2018-04-09 DIAGNOSIS — D631 Anemia in chronic kidney disease: Secondary | ICD-10-CM | POA: Diagnosis not present

## 2018-04-09 DIAGNOSIS — D509 Iron deficiency anemia, unspecified: Secondary | ICD-10-CM | POA: Diagnosis not present

## 2018-04-09 DIAGNOSIS — N2581 Secondary hyperparathyroidism of renal origin: Secondary | ICD-10-CM | POA: Diagnosis not present

## 2018-04-09 DIAGNOSIS — N186 End stage renal disease: Secondary | ICD-10-CM | POA: Diagnosis not present

## 2018-04-09 DIAGNOSIS — E875 Hyperkalemia: Secondary | ICD-10-CM | POA: Diagnosis not present

## 2018-04-11 DIAGNOSIS — N2581 Secondary hyperparathyroidism of renal origin: Secondary | ICD-10-CM | POA: Diagnosis not present

## 2018-04-11 DIAGNOSIS — D631 Anemia in chronic kidney disease: Secondary | ICD-10-CM | POA: Diagnosis not present

## 2018-04-11 DIAGNOSIS — D509 Iron deficiency anemia, unspecified: Secondary | ICD-10-CM | POA: Diagnosis not present

## 2018-04-11 DIAGNOSIS — E875 Hyperkalemia: Secondary | ICD-10-CM | POA: Diagnosis not present

## 2018-04-11 DIAGNOSIS — N186 End stage renal disease: Secondary | ICD-10-CM | POA: Diagnosis not present

## 2018-04-13 DIAGNOSIS — Z992 Dependence on renal dialysis: Secondary | ICD-10-CM | POA: Diagnosis not present

## 2018-04-13 DIAGNOSIS — I12 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 stage renal disease: Secondary | ICD-10-CM | POA: Diagnosis not present

## 2018-04-13 DIAGNOSIS — N186 End stage renal disease: Secondary | ICD-10-CM | POA: Diagnosis not present

## 2018-04-14 DIAGNOSIS — N186 End stage renal disease: Secondary | ICD-10-CM | POA: Diagnosis not present

## 2018-04-14 DIAGNOSIS — D631 Anemia in chronic kidney disease: Secondary | ICD-10-CM | POA: Diagnosis not present

## 2018-04-14 DIAGNOSIS — E875 Hyperkalemia: Secondary | ICD-10-CM | POA: Diagnosis not present

## 2018-04-14 DIAGNOSIS — N2581 Secondary hyperparathyroidism of renal origin: Secondary | ICD-10-CM | POA: Diagnosis not present

## 2018-04-16 DIAGNOSIS — N2581 Secondary hyperparathyroidism of renal origin: Secondary | ICD-10-CM | POA: Diagnosis not present

## 2018-04-16 DIAGNOSIS — E875 Hyperkalemia: Secondary | ICD-10-CM | POA: Diagnosis not present

## 2018-04-16 DIAGNOSIS — N186 End stage renal disease: Secondary | ICD-10-CM | POA: Diagnosis not present

## 2018-04-16 DIAGNOSIS — D631 Anemia in chronic kidney disease: Secondary | ICD-10-CM | POA: Diagnosis not present

## 2018-04-18 DIAGNOSIS — N2581 Secondary hyperparathyroidism of renal origin: Secondary | ICD-10-CM | POA: Diagnosis not present

## 2018-04-18 DIAGNOSIS — N186 End stage renal disease: Secondary | ICD-10-CM | POA: Diagnosis not present

## 2018-04-18 DIAGNOSIS — D631 Anemia in chronic kidney disease: Secondary | ICD-10-CM | POA: Diagnosis not present

## 2018-04-18 DIAGNOSIS — E875 Hyperkalemia: Secondary | ICD-10-CM | POA: Diagnosis not present

## 2018-04-20 ENCOUNTER — Other Ambulatory Visit: Payer: Self-pay

## 2018-04-20 ENCOUNTER — Non-Acute Institutional Stay (HOSPITAL_COMMUNITY)
Admission: EM | Admit: 2018-04-20 | Discharge: 2018-04-21 | Disposition: A | Discharge status: Home / Self Care | Payer: Medicare Other | Attending: Emergency Medicine | Admitting: Emergency Medicine

## 2018-04-20 ENCOUNTER — Ambulatory Visit (HOSPITAL_COMMUNITY)
Admission: EM | Admit: 2018-04-20 | Discharge: 2018-04-20 | Disposition: A | Discharge status: Home / Self Care | Payer: Medicare Other

## 2018-04-20 ENCOUNTER — Encounter (HOSPITAL_COMMUNITY): Payer: Self-pay | Admitting: Emergency Medicine

## 2018-04-20 DIAGNOSIS — I12 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 stage renal disease: Secondary | ICD-10-CM | POA: Insufficient documentation

## 2018-04-20 DIAGNOSIS — R7303 Prediabetes: Secondary | ICD-10-CM | POA: Diagnosis not present

## 2018-04-20 DIAGNOSIS — Z8673 Personal history of transient ischemic attack (TIA), and cerebral infarction without residual deficits: Secondary | ICD-10-CM | POA: Diagnosis not present

## 2018-04-20 DIAGNOSIS — Z823 Family history of stroke: Secondary | ICD-10-CM | POA: Insufficient documentation

## 2018-04-20 DIAGNOSIS — Z87891 Personal history of nicotine dependence: Secondary | ICD-10-CM | POA: Diagnosis not present

## 2018-04-20 DIAGNOSIS — R569 Unspecified convulsions: Secondary | ICD-10-CM | POA: Diagnosis not present

## 2018-04-20 DIAGNOSIS — G4730 Sleep apnea, unspecified: Secondary | ICD-10-CM | POA: Insufficient documentation

## 2018-04-20 DIAGNOSIS — Z992 Dependence on renal dialysis: Secondary | ICD-10-CM | POA: Insufficient documentation

## 2018-04-20 DIAGNOSIS — N2581 Secondary hyperparathyroidism of renal origin: Secondary | ICD-10-CM | POA: Insufficient documentation

## 2018-04-20 DIAGNOSIS — N186 End stage renal disease: Secondary | ICD-10-CM | POA: Diagnosis not present

## 2018-04-20 DIAGNOSIS — E875 Hyperkalemia: Secondary | ICD-10-CM | POA: Diagnosis not present

## 2018-04-20 DIAGNOSIS — Z79899 Other long term (current) drug therapy: Secondary | ICD-10-CM | POA: Insufficient documentation

## 2018-04-20 DIAGNOSIS — Z791 Long term (current) use of non-steroidal anti-inflammatories (NSAID): Secondary | ICD-10-CM | POA: Insufficient documentation

## 2018-04-20 DIAGNOSIS — Z885 Allergy status to narcotic agent status: Secondary | ICD-10-CM | POA: Diagnosis not present

## 2018-04-20 DIAGNOSIS — Z8249 Family history of ischemic heart disease and other diseases of the circulatory system: Secondary | ICD-10-CM | POA: Diagnosis not present

## 2018-04-20 DIAGNOSIS — Z888 Allergy status to other drugs, medicaments and biological substances status: Secondary | ICD-10-CM | POA: Insufficient documentation

## 2018-04-20 DIAGNOSIS — Z7982 Long term (current) use of aspirin: Secondary | ICD-10-CM | POA: Insufficient documentation

## 2018-04-20 LAB — CBC
HCT: 36.9 % — ABNORMAL LOW (ref 39.0–52.0)
Hemoglobin: 11.4 g/dL — ABNORMAL LOW (ref 13.0–17.0)
MCH: 27.5 pg (ref 26.0–34.0)
MCHC: 30.9 g/dL (ref 30.0–36.0)
MCV: 88.9 fL (ref 80.0–100.0)
Platelets: 210 10*3/uL (ref 150–400)
RBC: 4.15 MIL/uL — ABNORMAL LOW (ref 4.22–5.81)
RDW: 15.9 % — ABNORMAL HIGH (ref 11.5–15.5)
WBC: 6.2 10*3/uL (ref 4.0–10.5)
nRBC: 0 % (ref 0.0–0.2)

## 2018-04-20 LAB — COMPREHENSIVE METABOLIC PANEL
ALT: 11 U/L (ref 0–44)
AST: 17 U/L (ref 15–41)
Albumin: 3.1 g/dL — ABNORMAL LOW (ref 3.5–5.0)
Alkaline Phosphatase: 57 U/L (ref 38–126)
Anion gap: 20 — ABNORMAL HIGH (ref 5–15)
BUN: 85 mg/dL — ABNORMAL HIGH (ref 6–20)
CO2: 17 mmol/L — ABNORMAL LOW (ref 22–32)
Calcium: 6.8 mg/dL — ABNORMAL LOW (ref 8.9–10.3)
Chloride: 99 mmol/L (ref 98–111)
Creatinine, Ser: 12.63 mg/dL — ABNORMAL HIGH (ref 0.61–1.24)
GFR calc Af Amer: 4 mL/min — ABNORMAL LOW (ref >60)
GFR calc non Af Amer: 4 mL/min — ABNORMAL LOW (ref >60)
Glucose, Bld: 88 mg/dL (ref 70–99)
Potassium: 7 mmol/L (ref 3.5–5.1)
Sodium: 136 mmol/L (ref 135–145)
Total Bilirubin: 0.4 mg/dL (ref 0.3–1.2)
Total Protein: 7.2 g/dL (ref 6.5–8.1)

## 2018-04-20 LAB — LIPASE, BLOOD: Lipase: 120 U/L — ABNORMAL HIGH (ref 11–51)

## 2018-04-20 MED ORDER — DEXTROSE 50 % IV SOLN
1.0000 | Freq: Once | INTRAVENOUS | Status: DC
  Filled 2018-04-20: qty 50

## 2018-04-20 MED ORDER — SODIUM CHLORIDE 0.9 % IV SOLN
1.0000 g | Freq: Once | INTRAVENOUS | Status: AC
  Administered 2018-04-20: 1 g via INTRAVENOUS
  Filled 2018-04-20: qty 10

## 2018-04-20 MED ORDER — SODIUM CHLORIDE 0.9% FLUSH
3.0000 mL | Freq: Once | INTRAVENOUS | Status: AC
  Administered 2018-04-20: 3 mL via INTRAVENOUS

## 2018-04-20 MED ORDER — INSULIN ASPART 100 UNIT/ML ~~LOC~~ SOLN: 5.0000 [IU] | Freq: Once | SUBCUTANEOUS | Status: DC

## 2018-04-20 MED ORDER — CHLORHEXIDINE GLUCONATE CLOTH 2 % EX PADS: 6.0000 | MEDICATED_PAD | Freq: Every day | CUTANEOUS | Status: DC

## 2018-04-20 MED ORDER — ALBUTEROL SULFATE (2.5 MG/3ML) 0.083% IN NEBU
5.0000 mg | INHALATION_SOLUTION | Freq: Once | RESPIRATORY_TRACT | Status: AC
  Administered 2018-04-20: 5 mg via RESPIRATORY_TRACT
  Filled 2018-04-20: qty 6

## 2018-04-20 NOTE — ED Triage Notes (Signed)
The patient said he has been having diarrhea all day yesterday and has had two today.  Denies any blood in the stool.  Patient is a dialysis patient at [facility] and has treatments M, W, F.  He says he thinks his potassium low because he has been having "jerky" movements to his body that he is aware of.  He says he is concerned and came in to be evaluated.  Denies any other symptoms.

## 2018-04-20 NOTE — ED Provider Notes (Signed)
[facility] EMERGENCY DEPARTMENT Provider Note   CSN: [id number] Arrival date & time: 04/20/18  1201    History   Chief Complaint Chief Complaint  Patient presents with  . Diarrhea    HPI Carl Heinicke Sr. is a 58 y.o. male.     HPI 58 year old male with history of end-stage renal disease, stroke, hypertension, chronic headaches, here with diarrhea and "shakes."  The patient states that on Friday, his car broke down so he was only able to sit for less than 2 hours of his usual 4-1/2-hour session.  He states that over the weekend, he also had some orange juice, which she knows is not good.  He states that over the last several hours, he is developed "shakes" and "jerks" which he normally gets when he gets hyperkalemia.  He is also developed loose, watery, nonbloody diarrhea which is also common for him when he gets hyperkalemic.  Denies any overt abdominal pain.  No cough, shortness of breath, or chest pain.  No recent fevers or chills.  No other medical complaints.  No recent or new medication changes.  Past Medical History:  Diagnosis Date  . Anemia   . Arthritis   . Borderline diabetes   . Chronic headaches   . Diverticulitis   . ESRD (end stage renal disease) ([place])     On Renal Transplant List," [facility]; MWF" (10/23/2016)  . GI bleed   . Hypertension   . Parathyroid abnormality (HCC)    ectopic parathyroid gland  . Presence of arteriovenous fistula for hemodialysis, primary ([place])    RUE  . Refusal of blood product    NO WHOLE BLOOD PROUCTS  . Secondary hyperparathyroidism ([place])   . Seizures ([place])    one episode in past  . Sleep apnea    doesn't use CPAP anymore since weight loss  . Stroke [facility]    no residual    Patient Active Problem List   Diagnosis Date Noted  . Hyperkalemia 03/12/2018  . 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 Secondary hyperparathyroidism of renal origin ([place]) 10/23/2016  . Other headache syndrome  08/21/2016  . TIA (transient ischemic attack)   . Essential hypertension   . Seizures ([place])   . Acute pericarditis   . Atypical chest pain 10/01/2015  . Abnormal EKG 10/01/2015  . 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 Lung nodule   . Diverticulitis of cecum 07/21/2015  . Diverticulitis of large intestine without perforation or abscess without bleeding   . Right lower quadrant pain   . Headache 05/05/2015  . 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 Hypertension secondary to other renal disorders 08/26/2014  . Diverticulosis 07/13/2014  . Lower GI bleed 07/12/2014  . Hematochezia   . Refusal of blood transfusions as patient is Jehovah's Witness   . Parotid mass   . HLD (hyperlipidemia)   . PRES (posterior reversible encephalopathy syndrome)   . Stroke ([place]) 05/23/2014  . Cerebral infarction ([place]) 05/23/2014  . Anemia of renal disease 05/23/2014  . HTN (hypertension) 12/09/2012  . GERD (gastroesophageal reflux disease) 12/09/2012  . ESRD on dialysis ([place]) 12/09/2012  . Gastrointestinal bleeding 11/12/2012    Past Surgical History:  Procedure Laterality Date  . AV FISTULA PLACEMENT Right    right arm  . CATARACT EXTRACTION W/ INTRAOCULAR LENS  IMPLANT, BILATERAL    . COLON SURGERY    . COLONOSCOPY N/A 08/04/2015   Procedure: COLONOSCOPY;  Surgeon: Jerene Bears, MD;  Location:  [facility];  Service: Endoscopy;  Laterality: N/A;  . graft left arm Left    for dialysis x 2  . INSERTION OF DIALYSIS CATHETER     Rt chest  . LAPAROSCOPIC RIGHT COLECTOMY N/A 08/05/2015   Procedure: LAPAROSCOPIC RIGHT COLECTOMY- ASCENDING;  Surgeon: Stark Klein, MD;  Location: [facility];  Service: General;  Laterality: N/A;  . PARATHYROIDECTOMY N/A 06/12/2016   Procedure: TOTAL PARATHYROIDECTOMY WITH AUTOTRANSPLANTATION TO LEFT FOREARM;  Surgeon: Armandina Gemma, MD;  Location: [facility];  Service: General;  Laterality: N/A;  . PARATHYROIDECTOMY  10/23/2016  . PARATHYROIDECTOMY N/A  10/23/2016   Procedure: PARATHYROIDECTOMY;  Surgeon: Armandina Gemma, MD;  Location: [facility];  Service: General;  Laterality: N/A;  . REVISON OF ARTERIOVENOUS FISTULA Right 07/16/2017   Procedure: REVISION OF ARTERIOVENOUS FISTULA  Right ARM;  Surgeon: Waynetta Sandy, MD;  Location: [facility];  Service: Vascular;  Laterality: Right;        Home Medications    Prior to Admission medications   Medication Sig Start Date End Date Taking? Authorizing Provider  aspirin EC 81 MG tablet Take 1 tablet (81 mg total) by mouth daily. 08/26/14   Rosalin Hawking, MD  atorvastatin (LIPITOR) 10 MG tablet Take 1 tablet (10 mg total) by mouth daily at 6 PM. 05/25/14   Thurnell Lose, MD  augmented betamethasone dipropionate (DIPROLENE-AF) 0.05 % ointment Apply 1 application topically daily. 02/04/18   [provider]  AURYXIA 1 GM 210 MG(Fe) tablet Take 210 mg by mouth 3 (three) times daily. 02/21/18   [provider]  docusate sodium (COLACE) 100 MG capsule Take 100 mg by mouth 2 (two) times daily as needed for mild constipation.    [provider]  gabapentin (NEURONTIN) 100 MG capsule Take 100 mg by mouth 2 (two) times daily.  09/29/14   [provider]  ibuprofen (ADVIL,MOTRIN) 800 MG tablet Take 800 mg by mouth 3 (three) times daily.  10/14/15   [provider]  multivitamin (RENA-VIT) TABS tablet Take 1 tablet by mouth daily.    [provider]  sevelamer carbonate (RENVELA) 800 MG tablet Take 1,600 mg by mouth 3 (three) times daily with meals.     [provider]  topiramate (TOPAMAX) 25 MG tablet Take 1 tablet (25 mg total) by mouth 2 (two) times daily. On HD days, take morning dose after HD treatment 11/05/17   Venancio Poisson, NP  traMADol (ULTRAM) 50 MG tablet Take 1 tablet (50 mg total) by mouth every 6 (six) hours as needed. 07/16/17   Gabriel Earing, PA-C    Family History Family History  Problem Relation Age of Onset  . Diabetes  Father   . Stroke Father   . Hypertension Father   . Uterine cancer Mother   . Lupus Sister   . Stroke Sister   . Hypertension Sister   . Anuerysm Brother        brain    Social History Social History   Tobacco Use  . Smoking status: Former Smoker    Last attempt to quit: 06/19/1990    Years since quitting: 27.8  . Smokeless tobacco: Never Used  . Tobacco comment: rarely cigar  Substance Use Topics  . Alcohol use: Yes    Alcohol/week: 1.0 standard drinks    Types: 1 Cans of beer per week    Comment: occasional  . Drug use: No     Allergies   Infed [iron dextran]; Oxycodone; Vicodin [hydrocodone-acetaminophen]; Whole blood;  and Diclofenac   Review of Systems Review of Systems  Constitutional: Positive for fatigue. Negative for chills and fever.  HENT: Negative for congestion and rhinorrhea.   Eyes: Negative for visual disturbance.  Respiratory: Negative for cough, shortness of breath and wheezing.   Cardiovascular: Negative for chest pain and leg swelling.  Gastrointestinal: Positive for diarrhea. Negative for abdominal pain, nausea and vomiting.  Genitourinary: Negative for dysuria and flank pain.  Musculoskeletal: Positive for myalgias. Negative for neck pain and neck stiffness.  Skin: Negative for rash and wound.  Allergic/Immunologic: Negative for immunocompromised state.  Neurological: Negative for syncope, weakness and headaches.  All other systems reviewed and are negative.    Physical Exam Updated Vital Signs BP (!) 158/95 (BP Location: Left Arm)   Pulse 84   Temp 98.3 F (36.8 C) (Oral)   Resp 17   Ht 6\' 5"  (1.956 m)   Wt 87 kg   SpO2 99%   BMI 22.74 kg/m   Physical Exam Vitals signs and nursing note reviewed.  Constitutional:      General: He is not in acute distress.    Appearance: He is well-developed.  HENT:     Head: Normocephalic and atraumatic.  Eyes:     Conjunctiva/sclera: Conjunctivae normal.  Neck:     Musculoskeletal: Neck  supple.  Cardiovascular:     Rate and Rhythm: Normal rate and regular rhythm.     Heart sounds: Normal heart sounds. No murmur. No friction rub.     Comments: AV fistula with palpable and audible thrill and hum Pulmonary:     Effort: Pulmonary effort is normal. No respiratory distress.     Breath sounds: Normal breath sounds. No wheezing or rales.  Abdominal:     General: Abdomen is flat. Bowel sounds are normal. There is no distension.     Palpations: Abdomen is soft.     Tenderness: There is no abdominal tenderness.  Skin:    General: Skin is warm.     Capillary Refill: Capillary refill takes less than 2 seconds.  Neurological:     Mental Status: He is alert and oriented to person, place, and time.     Motor: No abnormal muscle tone.      ED Treatments / Results  Labs (all labs ordered are listed, but only abnormal results are displayed) Labs Reviewed  LIPASE, BLOOD - Abnormal; Notable for the following components:      Result Value   Lipase 120 (*)    All other components within normal limits  COMPREHENSIVE METABOLIC PANEL - Abnormal; Notable for the following components:   Potassium 7.0 (*)    CO2 17 (*)    BUN 85 (*)    Creatinine, Ser 12.63 (*)    Calcium 6.8 (*)    Albumin 3.1 (*)    GFR calc non Af Amer 4 (*)    GFR calc Af Amer 4 (*)    Anion gap 20 (*)    All other components within normal limits  CBC - Abnormal; Notable for the following components:   RBC 4.15 (*)    Hemoglobin 11.4 (*)    HCT 36.9 (*)    RDW 15.9 (*)    All other components within normal limits  URINALYSIS, ROUTINE W REFLEX MICROSCOPIC  CBG MONITORING, ED    EKG EKG Interpretation  Date/Time:  Sunday April 20 2018 14:58:48 EDT Ventricular Rate:  77 PR Interval:    QRS Duration: 128 QT Interval:  442 QTC  Calculation: 501 R Axis:   -5 Text Interpretation:  Sinus rhythm LVH with IVCD and secondary repol abnrm Prolonged QT interval Since last EKG, TW are less peaked relative to  prior TWI in lateral precordial leads is new TWI in lateral limb leads are less evident Confirmed by Duffy Bruce [phone]) on 04/20/2018 3:04:26 PM   Radiology No results found.  Procedures .Critical Care Performed by: Duffy Bruce, MD Authorized by: Duffy Bruce, MD   Critical care provider statement:    Critical care time (minutes):  35   Critical care time was exclusive of:  Separately billable procedures and treating other patients and teaching time   Critical care was necessary to treat or prevent imminent or life-threatening deterioration of the following conditions:  Metabolic crisis, cardiac failure and circulatory failure   Critical care was time spent personally by me on the following activities:  Development of treatment plan with patient or surrogate, discussions with consultants, evaluation of patient's response to treatment, examination of patient, obtaining history from patient or surrogate, ordering and performing treatments and interventions, ordering and review of laboratory studies, ordering and review of radiographic studies, pulse oximetry, re-evaluation of patient's condition and review of old charts   I assumed direction of critical care for this patient from another provider in my specialty: no     (including critical care time)  Medications Ordered in ED Medications  sodium chloride flush (NS) 0.9 % injection 3 mL (has no administration in time range)  calcium gluconate 1 g in sodium chloride 0.9 % 100 mL IVPB (has no administration in time range)  insulin aspart (novoLOG) injection 5 Units (has no administration in time range)  dextrose 50 % solution 50 mL (has no administration in time range)  albuterol (PROVENTIL) (2.5 MG/3ML) 0.083% nebulizer solution 5 mg (has no administration in time range)     Initial Impression / Assessment and Plan / ED Course  I have reviewed the triage vital signs and the nursing notes.  Pertinent labs & imaging results that  were available during my care of the patient were reviewed by me and considered in my medical decision making (see chart for details).       58 yo M here with muscle spasms, diarrhea likely 2/2 hyperkalemia and uremia. History of similar episodes. His abdomen is soft, NT, ND, with no rebound or guarding. No focal TTP to suggest chole, appy, diverticulitis, or other acute emergent pathology. Lipase elevated but he's had no vomiting, no epigastric pain, and he has a h/o chronic elevations.  EKG @ baseline. Will temporize given degree of hyperkalemia, take for urgent HD today. Dr. Jonnie Finner consulted, appreciate reccs and consult.   Final Clinical Impressions(s) / ED Diagnoses   Final diagnoses:  ESRD (end stage renal disease) ([place])  Hyperkalemia    ED Discharge Orders    None       Duffy Bruce, MD 04/20/18 1521

## 2018-04-20 NOTE — ED Triage Notes (Signed)
Hemodialysis pt; states started with "the shakes" yesterday; wishes to have electrolytes checked and possibly treated.  Discussed with Rondel Oh, NP - informed pt he needs to go to ED with family member.  Pt verbalized understanding.

## 2018-04-20 NOTE — Consult Note (Signed)
Renal Service Consult Note [facility]  Carl Fiallos Sr. 04/20/2018 [place] Requesting Physician:  Dr Ellender Hose  Reason for Consult:  ESRD pt w/ hyperkalemia HPI: The patient is a 58 y.o. year-old with hx of HTN, CVA, seizures and ESRD on HD MWF.  He missed part of HD Friday due to health issues/ diarrhea and presented to ED today because of "jerking" of the arms.  Labs in ED showeing K 7.0. asked to see for HD.  EKG shows no changes.   Patient denies any SOB or CP, no abd pain , n/v.  No recent access issues. On HD since 2008   ROS  denies CP  no joint pain   no HA  no blurry vision  no rash  no diarrhea  no nausea/ vomiting   Past Medical History  Past Medical History:  Diagnosis Date  . Anemia   . Arthritis   . Borderline diabetes   . Chronic headaches   . Diverticulitis   . ESRD (end stage renal disease) ([place])     On Renal Transplant List," [facility]; MWF" (10/23/2016)  . GI bleed   . Hypertension   . Parathyroid abnormality (HCC)    ectopic parathyroid gland  . Presence of arteriovenous fistula for hemodialysis, primary ([place])    RUE  . Refusal of blood product    NO WHOLE BLOOD PROUCTS  . Secondary hyperparathyroidism ([place])   . Seizures ([place])    one episode in past  . Sleep apnea    doesn't use CPAP anymore since weight loss  . Stroke [facility])    no residual   Past Surgical History  Past Surgical History:  Procedure Laterality Date  . AV FISTULA PLACEMENT Right    right arm  . CATARACT EXTRACTION W/ INTRAOCULAR LENS  IMPLANT, BILATERAL    . COLON SURGERY    . COLONOSCOPY N/A 08/04/2015   Procedure: COLONOSCOPY;  Surgeon: Jerene Bears, MD;  Location: [facility];  Service: Endoscopy;  Laterality: N/A;  . graft left arm Left    for dialysis x 2  . INSERTION OF DIALYSIS CATHETER     Rt chest  . LAPAROSCOPIC RIGHT COLECTOMY N/A 08/05/2015   Procedure: LAPAROSCOPIC RIGHT COLECTOMY- ASCENDING;  Surgeon: Stark Klein, MD;  Location: [facility];  Service: General;  Laterality: N/A;  . PARATHYROIDECTOMY N/A 06/12/2016   Procedure: TOTAL PARATHYROIDECTOMY WITH AUTOTRANSPLANTATION TO LEFT FOREARM;  Surgeon: Armandina Gemma, MD;  Location: [facility];  Service: General;  Laterality: N/A;  . PARATHYROIDECTOMY  10/23/2016  . PARATHYROIDECTOMY N/A 10/23/2016   Procedure: PARATHYROIDECTOMY;  Surgeon: Armandina Gemma, MD;  Location: [facility];  Service: General;  Laterality: N/A;  . REVISON OF ARTERIOVENOUS FISTULA Right 07/16/2017   Procedure: REVISION OF ARTERIOVENOUS FISTULA  Right ARM;  Surgeon: Waynetta Sandy, MD;  Location: [facility];  Service: Vascular;  Laterality: Right;   Family History  Family History  Problem Relation Age of Onset  . Diabetes Father   . Stroke Father   . Hypertension Father   . Uterine cancer Mother   . Lupus Sister   . Stroke Sister   . Hypertension Sister   . Anuerysm Brother        brain   Social History  reports that he quit smoking about 27 years ago. He has never used smokeless tobacco. He reports current alcohol use of about 1.0 standard drinks of alcohol per week. He reports that he does not use drugs. Allergies  Allergies  Allergen Reactions  . Infed [Iron Dextran] Other (See Comments)    Decreased BP   . Oxycodone Nausea Only  . Vicodin [Hydrocodone-Acetaminophen] Other (See Comments)    Decreased BP  . Whole Blood Other (See Comments)    Patient refuses whole blood "unless I am going to die"  . Diclofenac Other (See Comments)    Unknown   Home medications Prior to Admission medications   Medication Sig Start Date End Date Taking? Authorizing Provider  aspirin EC 81 MG tablet Take 1 tablet (81 mg total) by mouth daily. 08/26/14   Rosalin Hawking, MD  atorvastatin (LIPITOR) 10 MG tablet Take 1 tablet (10 mg total) by mouth daily at 6 PM. 05/25/14   Thurnell Lose, MD  augmented betamethasone dipropionate (DIPROLENE-AF) 0.05 % ointment Apply 1 application topically daily. 02/04/18   [provider]  AURYXIA 1 GM 210 MG(Fe) tablet Take 210 mg by mouth 3 (three) times daily. 02/21/18   [provider]  docusate sodium (COLACE) 100 MG capsule Take 100 mg by mouth 2 (two) times daily as needed for mild constipation.    [provider]  gabapentin (NEURONTIN) 100 MG capsule Take 100 mg by mouth 2 (two) times daily.  09/29/14   [provider]  ibuprofen (ADVIL,MOTRIN) 800 MG tablet Take 800 mg by mouth 3 (three) times daily.  10/14/15   [provider]  multivitamin (RENA-VIT) TABS tablet Take 1 tablet by mouth daily.    [provider]  sevelamer carbonate (RENVELA) 800 MG tablet Take 1,600 mg by mouth 3 (three) times daily with meals.     [provider]  topiramate (TOPAMAX) 25 MG tablet Take 1 tablet (25 mg total) by mouth 2 (two) times daily. On HD days, take morning dose after HD treatment 11/05/17   Venancio Poisson, NP  traMADol (ULTRAM) 50 MG tablet Take 1 tablet (50 mg total) by mouth every 6 (six) hours as needed. 07/16/17   Gabriel Earing, PA-C   Liver Function Tests Recent Labs  Lab 04/20/18 1248  AST 17  ALT 11  ALKPHOS 57  BILITOT 0.4  PROT 7.2  ALBUMIN 3.1*   Recent Labs  Lab 04/20/18 1248  LIPASE 120*   CBC Recent Labs  Lab 04/20/18 1248  WBC 6.2  HGB 11.4*  HCT 36.9*  MCV 88.9  PLT 245   Basic Metabolic Panel Recent Labs  Lab 04/20/18 1248  NA 136  K 7.0*  CL 99  CO2 17*  GLUCOSE 88  BUN 85*  CREATININE 12.63*  CALCIUM 6.8*   Iron/TIBC/Ferritin/ %Sat    Component Value Date/Time   IRON 204 (H) 07/14/2014 0500   TIBC 239 (L) 07/14/2014 0500   FERRITIN 1,462 (H) 07/14/2014 0500   IRONPCTSAT 85 (H) 07/14/2014 0500    Vitals:   04/20/18 1227 04/20/18 1244  BP: (!) 158/95   Pulse: 84   Resp: 17   Temp: 98.3 F (36.8 C)   TempSrc: Oral   SpO2: 99%   Weight:  87 kg  Height:  6\' 5"  (1.956 m)   Exam Gen alert, no distress, no asterixis No rash, cyanosis or  gangrene Sclera anicteric, throat clear   No jvd or bruit Chest clear bilat no rales or wheezing RRR no MRG Abd soft ntnd no mass or ascites +bs GU normal male MS no joint effusions or deformity Ext no LE edema, no wounds or ulcers Neuro is alert, Ox 3 , nf R FA AVF +  bruit    MWF NW  4h 52min   86kg  2/2 bath  Hep 3000   RFA AVF   Assessment: 1. Hyperkalemia - severe 2. ESRD HD MWF.  Missed part of HD on Friday.  3. Vol - appears ok on exam, no resp issues.  4. HTN  5. H/o CVA    Plan: 1. HD tonight upstairs 2.5hrs, may go home after HD.  He has regular HD at 5:30 tomorrow am.        [facility] 04/20/2018, 3:31 PM

## 2018-04-21 DIAGNOSIS — E875 Hyperkalemia: Secondary | ICD-10-CM | POA: Diagnosis not present

## 2018-04-21 DIAGNOSIS — N186 End stage renal disease: Secondary | ICD-10-CM | POA: Diagnosis not present

## 2018-04-21 DIAGNOSIS — D631 Anemia in chronic kidney disease: Secondary | ICD-10-CM | POA: Diagnosis not present

## 2018-04-21 DIAGNOSIS — N2581 Secondary hyperparathyroidism of renal origin: Secondary | ICD-10-CM | POA: Diagnosis not present

## 2018-04-23 DIAGNOSIS — N186 End stage renal disease: Secondary | ICD-10-CM | POA: Diagnosis not present

## 2018-04-23 DIAGNOSIS — D631 Anemia in chronic kidney disease: Secondary | ICD-10-CM | POA: Diagnosis not present

## 2018-04-23 DIAGNOSIS — E875 Hyperkalemia: Secondary | ICD-10-CM | POA: Diagnosis not present

## 2018-04-23 DIAGNOSIS — N2581 Secondary hyperparathyroidism of renal origin: Secondary | ICD-10-CM | POA: Diagnosis not present

## 2018-04-25 DIAGNOSIS — N186 End stage renal disease: Secondary | ICD-10-CM | POA: Diagnosis not present

## 2018-04-25 DIAGNOSIS — N2581 Secondary hyperparathyroidism of renal origin: Secondary | ICD-10-CM | POA: Diagnosis not present

## 2018-04-25 DIAGNOSIS — E875 Hyperkalemia: Secondary | ICD-10-CM | POA: Diagnosis not present

## 2018-04-25 DIAGNOSIS — D631 Anemia in chronic kidney disease: Secondary | ICD-10-CM | POA: Diagnosis not present

## 2018-04-28 DIAGNOSIS — N2581 Secondary hyperparathyroidism of renal origin: Secondary | ICD-10-CM | POA: Diagnosis not present

## 2018-04-28 DIAGNOSIS — E875 Hyperkalemia: Secondary | ICD-10-CM | POA: Diagnosis not present

## 2018-04-28 DIAGNOSIS — N186 End stage renal disease: Secondary | ICD-10-CM | POA: Diagnosis not present

## 2018-04-28 DIAGNOSIS — D631 Anemia in chronic kidney disease: Secondary | ICD-10-CM | POA: Diagnosis not present

## 2018-04-30 DIAGNOSIS — N2581 Secondary hyperparathyroidism of renal origin: Secondary | ICD-10-CM | POA: Diagnosis not present

## 2018-04-30 DIAGNOSIS — D631 Anemia in chronic kidney disease: Secondary | ICD-10-CM | POA: Diagnosis not present

## 2018-04-30 DIAGNOSIS — E875 Hyperkalemia: Secondary | ICD-10-CM | POA: Diagnosis not present

## 2018-04-30 DIAGNOSIS — N186 End stage renal disease: Secondary | ICD-10-CM | POA: Diagnosis not present

## 2018-05-02 DIAGNOSIS — N186 End stage renal disease: Secondary | ICD-10-CM | POA: Diagnosis not present

## 2018-05-02 DIAGNOSIS — N2581 Secondary hyperparathyroidism of renal origin: Secondary | ICD-10-CM | POA: Diagnosis not present

## 2018-05-02 DIAGNOSIS — E875 Hyperkalemia: Secondary | ICD-10-CM | POA: Diagnosis not present

## 2018-05-02 DIAGNOSIS — D631 Anemia in chronic kidney disease: Secondary | ICD-10-CM | POA: Diagnosis not present

## 2018-05-05 DIAGNOSIS — D631 Anemia in chronic kidney disease: Secondary | ICD-10-CM | POA: Diagnosis not present

## 2018-05-05 DIAGNOSIS — E875 Hyperkalemia: Secondary | ICD-10-CM | POA: Diagnosis not present

## 2018-05-05 DIAGNOSIS — R5383 Other fatigue: Secondary | ICD-10-CM | POA: Diagnosis not present

## 2018-05-05 DIAGNOSIS — N2581 Secondary hyperparathyroidism of renal origin: Secondary | ICD-10-CM | POA: Diagnosis not present

## 2018-05-05 DIAGNOSIS — G63 Polyneuropathy in diseases classified elsewhere: Secondary | ICD-10-CM | POA: Diagnosis not present

## 2018-05-05 DIAGNOSIS — Z992 Dependence on renal dialysis: Secondary | ICD-10-CM | POA: Diagnosis not present

## 2018-05-05 DIAGNOSIS — N186 End stage renal disease: Secondary | ICD-10-CM | POA: Diagnosis not present

## 2018-05-07 DIAGNOSIS — D631 Anemia in chronic kidney disease: Secondary | ICD-10-CM | POA: Diagnosis not present

## 2018-05-07 DIAGNOSIS — E875 Hyperkalemia: Secondary | ICD-10-CM | POA: Diagnosis not present

## 2018-05-07 DIAGNOSIS — N2581 Secondary hyperparathyroidism of renal origin: Secondary | ICD-10-CM | POA: Diagnosis not present

## 2018-05-07 DIAGNOSIS — N186 End stage renal disease: Secondary | ICD-10-CM | POA: Diagnosis not present

## 2018-05-09 DIAGNOSIS — E875 Hyperkalemia: Secondary | ICD-10-CM | POA: Diagnosis not present

## 2018-05-09 DIAGNOSIS — N2581 Secondary hyperparathyroidism of renal origin: Secondary | ICD-10-CM | POA: Diagnosis not present

## 2018-05-09 DIAGNOSIS — N186 End stage renal disease: Secondary | ICD-10-CM | POA: Diagnosis not present

## 2018-05-09 DIAGNOSIS — D631 Anemia in chronic kidney disease: Secondary | ICD-10-CM | POA: Diagnosis not present

## 2018-05-13 DIAGNOSIS — N2581 Secondary hyperparathyroidism of renal origin: Secondary | ICD-10-CM | POA: Diagnosis not present

## 2018-05-13 DIAGNOSIS — E875 Hyperkalemia: Secondary | ICD-10-CM | POA: Diagnosis not present

## 2018-05-13 DIAGNOSIS — D631 Anemia in chronic kidney disease: Secondary | ICD-10-CM | POA: Diagnosis not present

## 2018-05-13 DIAGNOSIS — N186 End stage renal disease: Secondary | ICD-10-CM | POA: Diagnosis not present

## 2018-05-14 DIAGNOSIS — N2581 Secondary hyperparathyroidism of renal origin: Secondary | ICD-10-CM | POA: Diagnosis not present

## 2018-05-14 DIAGNOSIS — D631 Anemia in chronic kidney disease: Secondary | ICD-10-CM | POA: Diagnosis not present

## 2018-05-14 DIAGNOSIS — N186 End stage renal disease: Secondary | ICD-10-CM | POA: Diagnosis not present

## 2018-05-14 DIAGNOSIS — I12 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 stage renal disease: Secondary | ICD-10-CM | POA: Diagnosis not present

## 2018-05-14 DIAGNOSIS — Z992 Dependence on renal dialysis: Secondary | ICD-10-CM | POA: Diagnosis not present

## 2018-05-14 DIAGNOSIS — D509 Iron deficiency anemia, unspecified: Secondary | ICD-10-CM | POA: Diagnosis not present

## 2018-05-14 DIAGNOSIS — E1129 Type 2 diabetes mellitus with other diabetic kidney complication: Secondary | ICD-10-CM | POA: Diagnosis not present

## 2018-05-14 DIAGNOSIS — E875 Hyperkalemia: Secondary | ICD-10-CM | POA: Diagnosis not present

## 2018-05-16 DIAGNOSIS — D509 Iron deficiency anemia, unspecified: Secondary | ICD-10-CM | POA: Diagnosis not present

## 2018-05-16 DIAGNOSIS — N186 End stage renal disease: Secondary | ICD-10-CM | POA: Diagnosis not present

## 2018-05-16 DIAGNOSIS — E875 Hyperkalemia: Secondary | ICD-10-CM | POA: Diagnosis not present

## 2018-05-16 DIAGNOSIS — D631 Anemia in chronic kidney disease: Secondary | ICD-10-CM | POA: Diagnosis not present

## 2018-05-16 DIAGNOSIS — N2581 Secondary hyperparathyroidism of renal origin: Secondary | ICD-10-CM | POA: Diagnosis not present

## 2018-05-19 DIAGNOSIS — D631 Anemia in chronic kidney disease: Secondary | ICD-10-CM | POA: Diagnosis not present

## 2018-05-19 DIAGNOSIS — E875 Hyperkalemia: Secondary | ICD-10-CM | POA: Diagnosis not present

## 2018-05-19 DIAGNOSIS — N186 End stage renal disease: Secondary | ICD-10-CM | POA: Diagnosis not present

## 2018-05-19 DIAGNOSIS — D509 Iron deficiency anemia, unspecified: Secondary | ICD-10-CM | POA: Diagnosis not present

## 2018-05-19 DIAGNOSIS — N2581 Secondary hyperparathyroidism of renal origin: Secondary | ICD-10-CM | POA: Diagnosis not present

## 2018-05-21 DIAGNOSIS — N186 End stage renal disease: Secondary | ICD-10-CM | POA: Diagnosis not present

## 2018-05-21 DIAGNOSIS — D631 Anemia in chronic kidney disease: Secondary | ICD-10-CM | POA: Diagnosis not present

## 2018-05-21 DIAGNOSIS — E875 Hyperkalemia: Secondary | ICD-10-CM | POA: Diagnosis not present

## 2018-05-21 DIAGNOSIS — D509 Iron deficiency anemia, unspecified: Secondary | ICD-10-CM | POA: Diagnosis not present

## 2018-05-21 DIAGNOSIS — N2581 Secondary hyperparathyroidism of renal origin: Secondary | ICD-10-CM | POA: Diagnosis not present

## 2018-05-23 DIAGNOSIS — N186 End stage renal disease: Secondary | ICD-10-CM | POA: Diagnosis not present

## 2018-05-23 DIAGNOSIS — D631 Anemia in chronic kidney disease: Secondary | ICD-10-CM | POA: Diagnosis not present

## 2018-05-23 DIAGNOSIS — D509 Iron deficiency anemia, unspecified: Secondary | ICD-10-CM | POA: Diagnosis not present

## 2018-05-23 DIAGNOSIS — E875 Hyperkalemia: Secondary | ICD-10-CM | POA: Diagnosis not present

## 2018-05-23 DIAGNOSIS — N2581 Secondary hyperparathyroidism of renal origin: Secondary | ICD-10-CM | POA: Diagnosis not present

## 2018-05-26 DIAGNOSIS — D631 Anemia in chronic kidney disease: Secondary | ICD-10-CM | POA: Diagnosis not present

## 2018-05-26 DIAGNOSIS — D509 Iron deficiency anemia, unspecified: Secondary | ICD-10-CM | POA: Diagnosis not present

## 2018-05-26 DIAGNOSIS — N186 End stage renal disease: Secondary | ICD-10-CM | POA: Diagnosis not present

## 2018-05-26 DIAGNOSIS — N2581 Secondary hyperparathyroidism of renal origin: Secondary | ICD-10-CM | POA: Diagnosis not present

## 2018-05-26 DIAGNOSIS — E875 Hyperkalemia: Secondary | ICD-10-CM | POA: Diagnosis not present

## 2018-05-28 DIAGNOSIS — N2581 Secondary hyperparathyroidism of renal origin: Secondary | ICD-10-CM | POA: Diagnosis not present

## 2018-05-28 DIAGNOSIS — D509 Iron deficiency anemia, unspecified: Secondary | ICD-10-CM | POA: Diagnosis not present

## 2018-05-28 DIAGNOSIS — D631 Anemia in chronic kidney disease: Secondary | ICD-10-CM | POA: Diagnosis not present

## 2018-05-28 DIAGNOSIS — E875 Hyperkalemia: Secondary | ICD-10-CM | POA: Diagnosis not present

## 2018-05-28 DIAGNOSIS — N186 End stage renal disease: Secondary | ICD-10-CM | POA: Diagnosis not present

## 2018-05-30 DIAGNOSIS — E875 Hyperkalemia: Secondary | ICD-10-CM | POA: Diagnosis not present

## 2018-05-30 DIAGNOSIS — D509 Iron deficiency anemia, unspecified: Secondary | ICD-10-CM | POA: Diagnosis not present

## 2018-05-30 DIAGNOSIS — N2581 Secondary hyperparathyroidism of renal origin: Secondary | ICD-10-CM | POA: Diagnosis not present

## 2018-05-30 DIAGNOSIS — D631 Anemia in chronic kidney disease: Secondary | ICD-10-CM | POA: Diagnosis not present

## 2018-05-30 DIAGNOSIS — N186 End stage renal disease: Secondary | ICD-10-CM | POA: Diagnosis not present

## 2018-06-02 DIAGNOSIS — N2581 Secondary hyperparathyroidism of renal origin: Secondary | ICD-10-CM | POA: Diagnosis not present

## 2018-06-02 DIAGNOSIS — D509 Iron deficiency anemia, unspecified: Secondary | ICD-10-CM | POA: Diagnosis not present

## 2018-06-02 DIAGNOSIS — N186 End stage renal disease: Secondary | ICD-10-CM | POA: Diagnosis not present

## 2018-06-02 DIAGNOSIS — E875 Hyperkalemia: Secondary | ICD-10-CM | POA: Diagnosis not present

## 2018-06-02 DIAGNOSIS — D631 Anemia in chronic kidney disease: Secondary | ICD-10-CM | POA: Diagnosis not present

## 2018-06-04 DIAGNOSIS — E875 Hyperkalemia: Secondary | ICD-10-CM | POA: Diagnosis not present

## 2018-06-04 DIAGNOSIS — N2581 Secondary hyperparathyroidism of renal origin: Secondary | ICD-10-CM | POA: Diagnosis not present

## 2018-06-04 DIAGNOSIS — D631 Anemia in chronic kidney disease: Secondary | ICD-10-CM | POA: Diagnosis not present

## 2018-06-04 DIAGNOSIS — N186 End stage renal disease: Secondary | ICD-10-CM | POA: Diagnosis not present

## 2018-06-04 DIAGNOSIS — D509 Iron deficiency anemia, unspecified: Secondary | ICD-10-CM | POA: Diagnosis not present

## 2018-06-06 DIAGNOSIS — D631 Anemia in chronic kidney disease: Secondary | ICD-10-CM | POA: Diagnosis not present

## 2018-06-06 DIAGNOSIS — N186 End stage renal disease: Secondary | ICD-10-CM | POA: Diagnosis not present

## 2018-06-06 DIAGNOSIS — N2581 Secondary hyperparathyroidism of renal origin: Secondary | ICD-10-CM | POA: Diagnosis not present

## 2018-06-06 DIAGNOSIS — E875 Hyperkalemia: Secondary | ICD-10-CM | POA: Diagnosis not present

## 2018-06-06 DIAGNOSIS — D509 Iron deficiency anemia, unspecified: Secondary | ICD-10-CM | POA: Diagnosis not present

## 2018-06-09 DIAGNOSIS — N2581 Secondary hyperparathyroidism of renal origin: Secondary | ICD-10-CM | POA: Diagnosis not present

## 2018-06-09 DIAGNOSIS — D509 Iron deficiency anemia, unspecified: Secondary | ICD-10-CM | POA: Diagnosis not present

## 2018-06-09 DIAGNOSIS — D631 Anemia in chronic kidney disease: Secondary | ICD-10-CM | POA: Diagnosis not present

## 2018-06-09 DIAGNOSIS — E875 Hyperkalemia: Secondary | ICD-10-CM | POA: Diagnosis not present

## 2018-06-09 DIAGNOSIS — N186 End stage renal disease: Secondary | ICD-10-CM | POA: Diagnosis not present

## 2018-06-11 DIAGNOSIS — N2581 Secondary hyperparathyroidism of renal origin: Secondary | ICD-10-CM | POA: Diagnosis not present

## 2018-06-11 DIAGNOSIS — N186 End stage renal disease: Secondary | ICD-10-CM | POA: Diagnosis not present

## 2018-06-11 DIAGNOSIS — D631 Anemia in chronic kidney disease: Secondary | ICD-10-CM | POA: Diagnosis not present

## 2018-06-11 DIAGNOSIS — D509 Iron deficiency anemia, unspecified: Secondary | ICD-10-CM | POA: Diagnosis not present

## 2018-06-11 DIAGNOSIS — E875 Hyperkalemia: Secondary | ICD-10-CM | POA: Diagnosis not present

## 2018-06-13 DIAGNOSIS — Z992 Dependence on renal dialysis: Secondary | ICD-10-CM | POA: Diagnosis not present

## 2018-06-13 DIAGNOSIS — E875 Hyperkalemia: Secondary | ICD-10-CM | POA: Diagnosis not present

## 2018-06-13 DIAGNOSIS — I12 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 stage renal disease: Secondary | ICD-10-CM | POA: Diagnosis not present

## 2018-06-13 DIAGNOSIS — N2581 Secondary hyperparathyroidism of renal origin: Secondary | ICD-10-CM | POA: Diagnosis not present

## 2018-06-13 DIAGNOSIS — D631 Anemia in chronic kidney disease: Secondary | ICD-10-CM | POA: Diagnosis not present

## 2018-06-13 DIAGNOSIS — N186 End stage renal disease: Secondary | ICD-10-CM | POA: Diagnosis not present

## 2018-06-16 DIAGNOSIS — N186 End stage renal disease: Secondary | ICD-10-CM | POA: Diagnosis not present

## 2018-06-16 DIAGNOSIS — N2581 Secondary hyperparathyroidism of renal origin: Secondary | ICD-10-CM | POA: Diagnosis not present

## 2018-06-16 DIAGNOSIS — D631 Anemia in chronic kidney disease: Secondary | ICD-10-CM | POA: Diagnosis not present

## 2018-06-16 DIAGNOSIS — E875 Hyperkalemia: Secondary | ICD-10-CM | POA: Diagnosis not present

## 2018-06-18 DIAGNOSIS — N186 End stage renal disease: Secondary | ICD-10-CM | POA: Diagnosis not present

## 2018-06-18 DIAGNOSIS — E875 Hyperkalemia: Secondary | ICD-10-CM | POA: Diagnosis not present

## 2018-06-18 DIAGNOSIS — N2581 Secondary hyperparathyroidism of renal origin: Secondary | ICD-10-CM | POA: Diagnosis not present

## 2018-06-18 DIAGNOSIS — D631 Anemia in chronic kidney disease: Secondary | ICD-10-CM | POA: Diagnosis not present

## 2018-06-20 DIAGNOSIS — N186 End stage renal disease: Secondary | ICD-10-CM | POA: Diagnosis not present

## 2018-06-20 DIAGNOSIS — N2581 Secondary hyperparathyroidism of renal origin: Secondary | ICD-10-CM | POA: Diagnosis not present

## 2018-06-20 DIAGNOSIS — E875 Hyperkalemia: Secondary | ICD-10-CM | POA: Diagnosis not present

## 2018-06-20 DIAGNOSIS — D631 Anemia in chronic kidney disease: Secondary | ICD-10-CM | POA: Diagnosis not present

## 2018-06-23 DIAGNOSIS — E875 Hyperkalemia: Secondary | ICD-10-CM | POA: Diagnosis not present

## 2018-06-23 DIAGNOSIS — N2581 Secondary hyperparathyroidism of renal origin: Secondary | ICD-10-CM | POA: Diagnosis not present

## 2018-06-23 DIAGNOSIS — N186 End stage renal disease: Secondary | ICD-10-CM | POA: Diagnosis not present

## 2018-06-23 DIAGNOSIS — D631 Anemia in chronic kidney disease: Secondary | ICD-10-CM | POA: Diagnosis not present

## 2018-06-24 DIAGNOSIS — Z85528 Personal history of other malignant neoplasm of kidney: Secondary | ICD-10-CM | POA: Diagnosis not present

## 2018-06-24 DIAGNOSIS — N186 End stage renal disease: Secondary | ICD-10-CM | POA: Diagnosis not present

## 2018-06-24 DIAGNOSIS — Z905 Acquired absence of kidney: Secondary | ICD-10-CM | POA: Diagnosis not present

## 2018-06-24 DIAGNOSIS — Z992 Dependence on renal dialysis: Secondary | ICD-10-CM | POA: Diagnosis not present

## 2018-06-24 DIAGNOSIS — C642 Malignant neoplasm of left kidney, except renal pelvis: Secondary | ICD-10-CM | POA: Diagnosis not present

## 2018-06-25 DIAGNOSIS — D631 Anemia in chronic kidney disease: Secondary | ICD-10-CM | POA: Diagnosis not present

## 2018-06-25 DIAGNOSIS — E875 Hyperkalemia: Secondary | ICD-10-CM | POA: Diagnosis not present

## 2018-06-25 DIAGNOSIS — N2581 Secondary hyperparathyroidism of renal origin: Secondary | ICD-10-CM | POA: Diagnosis not present

## 2018-06-25 DIAGNOSIS — N186 End stage renal disease: Secondary | ICD-10-CM | POA: Diagnosis not present

## 2018-06-27 DIAGNOSIS — D631 Anemia in chronic kidney disease: Secondary | ICD-10-CM | POA: Diagnosis not present

## 2018-06-27 DIAGNOSIS — E875 Hyperkalemia: Secondary | ICD-10-CM | POA: Diagnosis not present

## 2018-06-27 DIAGNOSIS — N186 End stage renal disease: Secondary | ICD-10-CM | POA: Diagnosis not present

## 2018-06-27 DIAGNOSIS — N2581 Secondary hyperparathyroidism of renal origin: Secondary | ICD-10-CM | POA: Diagnosis not present

## 2018-06-30 DIAGNOSIS — N2581 Secondary hyperparathyroidism of renal origin: Secondary | ICD-10-CM | POA: Diagnosis not present

## 2018-06-30 DIAGNOSIS — E875 Hyperkalemia: Secondary | ICD-10-CM | POA: Diagnosis not present

## 2018-06-30 DIAGNOSIS — N186 End stage renal disease: Secondary | ICD-10-CM | POA: Diagnosis not present

## 2018-06-30 DIAGNOSIS — D631 Anemia in chronic kidney disease: Secondary | ICD-10-CM | POA: Diagnosis not present

## 2018-07-02 DIAGNOSIS — N2581 Secondary hyperparathyroidism of renal origin: Secondary | ICD-10-CM | POA: Diagnosis not present

## 2018-07-02 DIAGNOSIS — D631 Anemia in chronic kidney disease: Secondary | ICD-10-CM | POA: Diagnosis not present

## 2018-07-02 DIAGNOSIS — E875 Hyperkalemia: Secondary | ICD-10-CM | POA: Diagnosis not present

## 2018-07-02 DIAGNOSIS — N186 End stage renal disease: Secondary | ICD-10-CM | POA: Diagnosis not present

## 2018-07-04 DIAGNOSIS — D631 Anemia in chronic kidney disease: Secondary | ICD-10-CM | POA: Diagnosis not present

## 2018-07-04 DIAGNOSIS — N2581 Secondary hyperparathyroidism of renal origin: Secondary | ICD-10-CM | POA: Diagnosis not present

## 2018-07-04 DIAGNOSIS — E875 Hyperkalemia: Secondary | ICD-10-CM | POA: Diagnosis not present

## 2018-07-04 DIAGNOSIS — N186 End stage renal disease: Secondary | ICD-10-CM | POA: Diagnosis not present

## 2018-07-07 DIAGNOSIS — N2581 Secondary hyperparathyroidism of renal origin: Secondary | ICD-10-CM | POA: Diagnosis not present

## 2018-07-07 DIAGNOSIS — E875 Hyperkalemia: Secondary | ICD-10-CM | POA: Diagnosis not present

## 2018-07-07 DIAGNOSIS — N186 End stage renal disease: Secondary | ICD-10-CM | POA: Diagnosis not present

## 2018-07-07 DIAGNOSIS — D631 Anemia in chronic kidney disease: Secondary | ICD-10-CM | POA: Diagnosis not present

## 2018-07-09 DIAGNOSIS — D631 Anemia in chronic kidney disease: Secondary | ICD-10-CM | POA: Diagnosis not present

## 2018-07-09 DIAGNOSIS — N186 End stage renal disease: Secondary | ICD-10-CM | POA: Diagnosis not present

## 2018-07-09 DIAGNOSIS — E875 Hyperkalemia: Secondary | ICD-10-CM | POA: Diagnosis not present

## 2018-07-09 DIAGNOSIS — N2581 Secondary hyperparathyroidism of renal origin: Secondary | ICD-10-CM | POA: Diagnosis not present

## 2018-07-11 DIAGNOSIS — D631 Anemia in chronic kidney disease: Secondary | ICD-10-CM | POA: Diagnosis not present

## 2018-07-11 DIAGNOSIS — N186 End stage renal disease: Secondary | ICD-10-CM | POA: Diagnosis not present

## 2018-07-11 DIAGNOSIS — E875 Hyperkalemia: Secondary | ICD-10-CM | POA: Diagnosis not present

## 2018-07-11 DIAGNOSIS — N2581 Secondary hyperparathyroidism of renal origin: Secondary | ICD-10-CM | POA: Diagnosis not present

## 2018-07-14 ENCOUNTER — Other Ambulatory Visit: Payer: Self-pay | Admitting: Nephrology

## 2018-07-14 DIAGNOSIS — N62 Hypertrophy of breast: Secondary | ICD-10-CM

## 2018-07-14 DIAGNOSIS — N186 End stage renal disease: Secondary | ICD-10-CM | POA: Diagnosis not present

## 2018-07-14 DIAGNOSIS — I12 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 stage renal disease: Secondary | ICD-10-CM | POA: Diagnosis not present

## 2018-07-14 DIAGNOSIS — Z992 Dependence on renal dialysis: Secondary | ICD-10-CM | POA: Diagnosis not present

## 2018-07-15 DIAGNOSIS — E875 Hyperkalemia: Secondary | ICD-10-CM | POA: Diagnosis not present

## 2018-07-15 DIAGNOSIS — N2581 Secondary hyperparathyroidism of renal origin: Secondary | ICD-10-CM | POA: Diagnosis not present

## 2018-07-15 DIAGNOSIS — D631 Anemia in chronic kidney disease: Secondary | ICD-10-CM | POA: Diagnosis not present

## 2018-07-15 DIAGNOSIS — N186 End stage renal disease: Secondary | ICD-10-CM | POA: Diagnosis not present

## 2018-07-18 DIAGNOSIS — D631 Anemia in chronic kidney disease: Secondary | ICD-10-CM | POA: Diagnosis not present

## 2018-07-18 DIAGNOSIS — N186 End stage renal disease: Secondary | ICD-10-CM | POA: Diagnosis not present

## 2018-07-18 DIAGNOSIS — N2581 Secondary hyperparathyroidism of renal origin: Secondary | ICD-10-CM | POA: Diagnosis not present

## 2018-07-18 DIAGNOSIS — E875 Hyperkalemia: Secondary | ICD-10-CM | POA: Diagnosis not present

## 2018-07-21 DIAGNOSIS — E875 Hyperkalemia: Secondary | ICD-10-CM | POA: Diagnosis not present

## 2018-07-21 DIAGNOSIS — N2581 Secondary hyperparathyroidism of renal origin: Secondary | ICD-10-CM | POA: Diagnosis not present

## 2018-07-21 DIAGNOSIS — N186 End stage renal disease: Secondary | ICD-10-CM | POA: Diagnosis not present

## 2018-07-21 DIAGNOSIS — D631 Anemia in chronic kidney disease: Secondary | ICD-10-CM | POA: Diagnosis not present

## 2018-07-23 DIAGNOSIS — E875 Hyperkalemia: Secondary | ICD-10-CM | POA: Diagnosis not present

## 2018-07-23 DIAGNOSIS — D631 Anemia in chronic kidney disease: Secondary | ICD-10-CM | POA: Diagnosis not present

## 2018-07-23 DIAGNOSIS — N186 End stage renal disease: Secondary | ICD-10-CM | POA: Diagnosis not present

## 2018-07-23 DIAGNOSIS — N2581 Secondary hyperparathyroidism of renal origin: Secondary | ICD-10-CM | POA: Diagnosis not present

## 2018-07-25 DIAGNOSIS — N186 End stage renal disease: Secondary | ICD-10-CM | POA: Diagnosis not present

## 2018-07-25 DIAGNOSIS — E875 Hyperkalemia: Secondary | ICD-10-CM | POA: Diagnosis not present

## 2018-07-25 DIAGNOSIS — N2581 Secondary hyperparathyroidism of renal origin: Secondary | ICD-10-CM | POA: Diagnosis not present

## 2018-07-25 DIAGNOSIS — D631 Anemia in chronic kidney disease: Secondary | ICD-10-CM | POA: Diagnosis not present

## 2018-07-28 ENCOUNTER — Other Ambulatory Visit: Payer: Self-pay | Admitting: Family Medicine

## 2018-07-28 DIAGNOSIS — D631 Anemia in chronic kidney disease: Secondary | ICD-10-CM | POA: Diagnosis not present

## 2018-07-28 DIAGNOSIS — N186 End stage renal disease: Secondary | ICD-10-CM | POA: Diagnosis not present

## 2018-07-28 DIAGNOSIS — N2581 Secondary hyperparathyroidism of renal origin: Secondary | ICD-10-CM | POA: Diagnosis not present

## 2018-07-28 DIAGNOSIS — E875 Hyperkalemia: Secondary | ICD-10-CM | POA: Diagnosis not present

## 2018-07-29 ENCOUNTER — Ambulatory Visit
Admission: RE | Admit: 2018-07-29 | Discharge: 2018-07-29 | Disposition: A | Discharge status: Home / Self Care | Payer: Medicare Other | Source: Ambulatory Visit | Attending: Nephrology | Admitting: Nephrology

## 2018-07-29 ENCOUNTER — Ambulatory Visit: Payer: Medicare Other

## 2018-07-29 ENCOUNTER — Other Ambulatory Visit: Payer: Self-pay

## 2018-07-29 DIAGNOSIS — R928 Other abnormal and inconclusive findings on diagnostic imaging of breast: Secondary | ICD-10-CM | POA: Diagnosis not present

## 2018-07-29 DIAGNOSIS — N62 Hypertrophy of breast: Secondary | ICD-10-CM

## 2018-07-30 DIAGNOSIS — N186 End stage renal disease: Secondary | ICD-10-CM | POA: Diagnosis not present

## 2018-07-30 DIAGNOSIS — D631 Anemia in chronic kidney disease: Secondary | ICD-10-CM | POA: Diagnosis not present

## 2018-07-30 DIAGNOSIS — N2581 Secondary hyperparathyroidism of renal origin: Secondary | ICD-10-CM | POA: Diagnosis not present

## 2018-07-30 DIAGNOSIS — E875 Hyperkalemia: Secondary | ICD-10-CM | POA: Diagnosis not present

## 2018-08-01 DIAGNOSIS — D631 Anemia in chronic kidney disease: Secondary | ICD-10-CM | POA: Diagnosis not present

## 2018-08-01 DIAGNOSIS — E875 Hyperkalemia: Secondary | ICD-10-CM | POA: Diagnosis not present

## 2018-08-01 DIAGNOSIS — N2581 Secondary hyperparathyroidism of renal origin: Secondary | ICD-10-CM | POA: Diagnosis not present

## 2018-08-01 DIAGNOSIS — N186 End stage renal disease: Secondary | ICD-10-CM | POA: Diagnosis not present

## 2018-08-04 DIAGNOSIS — N186 End stage renal disease: Secondary | ICD-10-CM | POA: Diagnosis not present

## 2018-08-04 DIAGNOSIS — D631 Anemia in chronic kidney disease: Secondary | ICD-10-CM | POA: Diagnosis not present

## 2018-08-04 DIAGNOSIS — M533 Sacrococcygeal disorders, not elsewhere classified: Secondary | ICD-10-CM | POA: Diagnosis not present

## 2018-08-04 DIAGNOSIS — N2581 Secondary hyperparathyroidism of renal origin: Secondary | ICD-10-CM | POA: Diagnosis not present

## 2018-08-04 DIAGNOSIS — E875 Hyperkalemia: Secondary | ICD-10-CM | POA: Diagnosis not present

## 2018-08-05 DIAGNOSIS — N2581 Secondary hyperparathyroidism of renal origin: Secondary | ICD-10-CM | POA: Diagnosis not present

## 2018-08-06 ENCOUNTER — Other Ambulatory Visit: Payer: Self-pay | Admitting: Surgery

## 2018-08-06 DIAGNOSIS — N186 End stage renal disease: Secondary | ICD-10-CM | POA: Diagnosis not present

## 2018-08-06 DIAGNOSIS — D631 Anemia in chronic kidney disease: Secondary | ICD-10-CM | POA: Diagnosis not present

## 2018-08-06 DIAGNOSIS — N2581 Secondary hyperparathyroidism of renal origin: Secondary | ICD-10-CM | POA: Diagnosis not present

## 2018-08-06 DIAGNOSIS — E875 Hyperkalemia: Secondary | ICD-10-CM | POA: Diagnosis not present

## 2018-08-08 DIAGNOSIS — N2581 Secondary hyperparathyroidism of renal origin: Secondary | ICD-10-CM | POA: Diagnosis not present

## 2018-08-08 DIAGNOSIS — E875 Hyperkalemia: Secondary | ICD-10-CM | POA: Diagnosis not present

## 2018-08-08 DIAGNOSIS — N186 End stage renal disease: Secondary | ICD-10-CM | POA: Diagnosis not present

## 2018-08-08 DIAGNOSIS — D631 Anemia in chronic kidney disease: Secondary | ICD-10-CM | POA: Diagnosis not present

## 2018-08-08 DIAGNOSIS — R197 Diarrhea, unspecified: Secondary | ICD-10-CM | POA: Insufficient documentation

## 2018-08-11 DIAGNOSIS — D631 Anemia in chronic kidney disease: Secondary | ICD-10-CM | POA: Diagnosis not present

## 2018-08-11 DIAGNOSIS — E875 Hyperkalemia: Secondary | ICD-10-CM | POA: Diagnosis not present

## 2018-08-11 DIAGNOSIS — N186 End stage renal disease: Secondary | ICD-10-CM | POA: Diagnosis not present

## 2018-08-11 DIAGNOSIS — N2581 Secondary hyperparathyroidism of renal origin: Secondary | ICD-10-CM | POA: Diagnosis not present

## 2018-08-13 DIAGNOSIS — Z992 Dependence on renal dialysis: Secondary | ICD-10-CM | POA: Diagnosis not present

## 2018-08-13 DIAGNOSIS — E875 Hyperkalemia: Secondary | ICD-10-CM | POA: Diagnosis not present

## 2018-08-13 DIAGNOSIS — I12 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 stage renal disease: Secondary | ICD-10-CM | POA: Diagnosis not present

## 2018-08-13 DIAGNOSIS — N186 End stage renal disease: Secondary | ICD-10-CM | POA: Diagnosis not present

## 2018-08-13 DIAGNOSIS — D509 Iron deficiency anemia, unspecified: Secondary | ICD-10-CM | POA: Diagnosis not present

## 2018-08-13 DIAGNOSIS — N2581 Secondary hyperparathyroidism of renal origin: Secondary | ICD-10-CM | POA: Diagnosis not present

## 2018-08-13 DIAGNOSIS — D631 Anemia in chronic kidney disease: Secondary | ICD-10-CM | POA: Diagnosis not present

## 2018-08-14 ENCOUNTER — Encounter (HOSPITAL_COMMUNITY): Payer: Self-pay

## 2018-08-14 ENCOUNTER — Encounter (HOSPITAL_COMMUNITY)
Admission: RE | Admit: 2018-08-14 | Discharge: 2018-08-14 | Disposition: A | Discharge status: Home / Self Care | Payer: Medicare Other | Source: Ambulatory Visit | Attending: Surgery | Admitting: Surgery

## 2018-08-14 ENCOUNTER — Encounter (HOSPITAL_COMMUNITY): Payer: Medicare Other

## 2018-08-14 DIAGNOSIS — N2581 Secondary hyperparathyroidism of renal origin: Secondary | ICD-10-CM | POA: Insufficient documentation

## 2018-08-15 DIAGNOSIS — N2581 Secondary hyperparathyroidism of renal origin: Secondary | ICD-10-CM | POA: Diagnosis not present

## 2018-08-15 DIAGNOSIS — D631 Anemia in chronic kidney disease: Secondary | ICD-10-CM | POA: Diagnosis not present

## 2018-08-15 DIAGNOSIS — E875 Hyperkalemia: Secondary | ICD-10-CM | POA: Diagnosis not present

## 2018-08-15 DIAGNOSIS — N186 End stage renal disease: Secondary | ICD-10-CM | POA: Diagnosis not present

## 2018-08-15 DIAGNOSIS — D509 Iron deficiency anemia, unspecified: Secondary | ICD-10-CM | POA: Diagnosis not present

## 2018-08-18 DIAGNOSIS — D631 Anemia in chronic kidney disease: Secondary | ICD-10-CM | POA: Diagnosis not present

## 2018-08-18 DIAGNOSIS — D509 Iron deficiency anemia, unspecified: Secondary | ICD-10-CM | POA: Diagnosis not present

## 2018-08-18 DIAGNOSIS — N186 End stage renal disease: Secondary | ICD-10-CM | POA: Diagnosis not present

## 2018-08-18 DIAGNOSIS — N2581 Secondary hyperparathyroidism of renal origin: Secondary | ICD-10-CM | POA: Diagnosis not present

## 2018-08-18 DIAGNOSIS — E875 Hyperkalemia: Secondary | ICD-10-CM | POA: Diagnosis not present

## 2018-08-19 DIAGNOSIS — M545 Low back pain: Secondary | ICD-10-CM | POA: Diagnosis not present

## 2018-08-20 DIAGNOSIS — D509 Iron deficiency anemia, unspecified: Secondary | ICD-10-CM | POA: Diagnosis not present

## 2018-08-20 DIAGNOSIS — D631 Anemia in chronic kidney disease: Secondary | ICD-10-CM | POA: Diagnosis not present

## 2018-08-20 DIAGNOSIS — E1129 Type 2 diabetes mellitus with other diabetic kidney complication: Secondary | ICD-10-CM | POA: Diagnosis not present

## 2018-08-20 DIAGNOSIS — E875 Hyperkalemia: Secondary | ICD-10-CM | POA: Diagnosis not present

## 2018-08-20 DIAGNOSIS — N2581 Secondary hyperparathyroidism of renal origin: Secondary | ICD-10-CM | POA: Diagnosis not present

## 2018-08-20 DIAGNOSIS — N186 End stage renal disease: Secondary | ICD-10-CM | POA: Diagnosis not present

## 2018-08-22 DIAGNOSIS — D631 Anemia in chronic kidney disease: Secondary | ICD-10-CM | POA: Diagnosis not present

## 2018-08-22 DIAGNOSIS — N2581 Secondary hyperparathyroidism of renal origin: Secondary | ICD-10-CM | POA: Diagnosis not present

## 2018-08-22 DIAGNOSIS — N186 End stage renal disease: Secondary | ICD-10-CM | POA: Diagnosis not present

## 2018-08-22 DIAGNOSIS — D509 Iron deficiency anemia, unspecified: Secondary | ICD-10-CM | POA: Diagnosis not present

## 2018-08-22 DIAGNOSIS — E875 Hyperkalemia: Secondary | ICD-10-CM | POA: Diagnosis not present

## 2018-08-25 DIAGNOSIS — N186 End stage renal disease: Secondary | ICD-10-CM | POA: Diagnosis not present

## 2018-08-25 DIAGNOSIS — N2581 Secondary hyperparathyroidism of renal origin: Secondary | ICD-10-CM | POA: Diagnosis not present

## 2018-08-25 DIAGNOSIS — D509 Iron deficiency anemia, unspecified: Secondary | ICD-10-CM | POA: Diagnosis not present

## 2018-08-25 DIAGNOSIS — E875 Hyperkalemia: Secondary | ICD-10-CM | POA: Diagnosis not present

## 2018-08-25 DIAGNOSIS — D631 Anemia in chronic kidney disease: Secondary | ICD-10-CM | POA: Diagnosis not present

## 2018-08-27 DIAGNOSIS — D509 Iron deficiency anemia, unspecified: Secondary | ICD-10-CM | POA: Diagnosis not present

## 2018-08-27 DIAGNOSIS — N186 End stage renal disease: Secondary | ICD-10-CM | POA: Diagnosis not present

## 2018-08-27 DIAGNOSIS — E875 Hyperkalemia: Secondary | ICD-10-CM | POA: Diagnosis not present

## 2018-08-27 DIAGNOSIS — N2581 Secondary hyperparathyroidism of renal origin: Secondary | ICD-10-CM | POA: Diagnosis not present

## 2018-08-27 DIAGNOSIS — D631 Anemia in chronic kidney disease: Secondary | ICD-10-CM | POA: Diagnosis not present

## 2018-08-29 DIAGNOSIS — D509 Iron deficiency anemia, unspecified: Secondary | ICD-10-CM | POA: Diagnosis not present

## 2018-08-29 DIAGNOSIS — Z01818 Encounter for other preprocedural examination: Secondary | ICD-10-CM | POA: Diagnosis not present

## 2018-08-29 DIAGNOSIS — N186 End stage renal disease: Secondary | ICD-10-CM | POA: Diagnosis not present

## 2018-08-29 DIAGNOSIS — E875 Hyperkalemia: Secondary | ICD-10-CM | POA: Diagnosis not present

## 2018-08-29 DIAGNOSIS — D631 Anemia in chronic kidney disease: Secondary | ICD-10-CM | POA: Diagnosis not present

## 2018-08-29 DIAGNOSIS — Z992 Dependence on renal dialysis: Secondary | ICD-10-CM | POA: Diagnosis not present

## 2018-08-29 DIAGNOSIS — N2581 Secondary hyperparathyroidism of renal origin: Secondary | ICD-10-CM | POA: Diagnosis not present

## 2018-09-01 DIAGNOSIS — N2581 Secondary hyperparathyroidism of renal origin: Secondary | ICD-10-CM | POA: Diagnosis not present

## 2018-09-01 DIAGNOSIS — D509 Iron deficiency anemia, unspecified: Secondary | ICD-10-CM | POA: Diagnosis not present

## 2018-09-01 DIAGNOSIS — N186 End stage renal disease: Secondary | ICD-10-CM | POA: Diagnosis not present

## 2018-09-01 DIAGNOSIS — E875 Hyperkalemia: Secondary | ICD-10-CM | POA: Diagnosis not present

## 2018-09-01 DIAGNOSIS — D631 Anemia in chronic kidney disease: Secondary | ICD-10-CM | POA: Diagnosis not present

## 2018-09-03 DIAGNOSIS — N2581 Secondary hyperparathyroidism of renal origin: Secondary | ICD-10-CM | POA: Diagnosis not present

## 2018-09-03 DIAGNOSIS — E875 Hyperkalemia: Secondary | ICD-10-CM | POA: Diagnosis not present

## 2018-09-03 DIAGNOSIS — D631 Anemia in chronic kidney disease: Secondary | ICD-10-CM | POA: Diagnosis not present

## 2018-09-03 DIAGNOSIS — D509 Iron deficiency anemia, unspecified: Secondary | ICD-10-CM | POA: Diagnosis not present

## 2018-09-03 DIAGNOSIS — N186 End stage renal disease: Secondary | ICD-10-CM | POA: Diagnosis not present

## 2018-09-04 ENCOUNTER — Encounter (HOSPITAL_COMMUNITY)
Admission: RE | Admit: 2018-09-04 | Discharge: 2018-09-04 | Disposition: A | Discharge status: Home / Self Care | Payer: Medicare Other | Source: Ambulatory Visit | Attending: Surgery | Admitting: Surgery

## 2018-09-04 ENCOUNTER — Other Ambulatory Visit: Payer: Self-pay

## 2018-09-04 DIAGNOSIS — N2581 Secondary hyperparathyroidism of renal origin: Secondary | ICD-10-CM | POA: Diagnosis not present

## 2018-09-04 MED ORDER — TECHNETIUM TC 99M SESTAMIBI GENERIC - CARDIOLITE
20.0000 | Freq: Once | INTRAVENOUS | Status: AC | PRN
  Administered 2018-09-04: 20 via INTRAVENOUS

## 2018-09-05 DIAGNOSIS — E875 Hyperkalemia: Secondary | ICD-10-CM | POA: Diagnosis not present

## 2018-09-05 DIAGNOSIS — N186 End stage renal disease: Secondary | ICD-10-CM | POA: Diagnosis not present

## 2018-09-05 DIAGNOSIS — D631 Anemia in chronic kidney disease: Secondary | ICD-10-CM | POA: Diagnosis not present

## 2018-09-05 DIAGNOSIS — D509 Iron deficiency anemia, unspecified: Secondary | ICD-10-CM | POA: Diagnosis not present

## 2018-09-05 DIAGNOSIS — N2581 Secondary hyperparathyroidism of renal origin: Secondary | ICD-10-CM | POA: Diagnosis not present

## 2018-09-08 DIAGNOSIS — N186 End stage renal disease: Secondary | ICD-10-CM | POA: Diagnosis not present

## 2018-09-08 DIAGNOSIS — N2581 Secondary hyperparathyroidism of renal origin: Secondary | ICD-10-CM | POA: Diagnosis not present

## 2018-09-08 DIAGNOSIS — E875 Hyperkalemia: Secondary | ICD-10-CM | POA: Diagnosis not present

## 2018-09-08 DIAGNOSIS — D631 Anemia in chronic kidney disease: Secondary | ICD-10-CM | POA: Diagnosis not present

## 2018-09-08 DIAGNOSIS — D509 Iron deficiency anemia, unspecified: Secondary | ICD-10-CM | POA: Diagnosis not present

## 2018-09-10 DIAGNOSIS — E875 Hyperkalemia: Secondary | ICD-10-CM | POA: Diagnosis not present

## 2018-09-10 DIAGNOSIS — D631 Anemia in chronic kidney disease: Secondary | ICD-10-CM | POA: Diagnosis not present

## 2018-09-10 DIAGNOSIS — N186 End stage renal disease: Secondary | ICD-10-CM | POA: Diagnosis not present

## 2018-09-10 DIAGNOSIS — D509 Iron deficiency anemia, unspecified: Secondary | ICD-10-CM | POA: Diagnosis not present

## 2018-09-10 DIAGNOSIS — N2581 Secondary hyperparathyroidism of renal origin: Secondary | ICD-10-CM | POA: Diagnosis not present

## 2018-09-12 DIAGNOSIS — E875 Hyperkalemia: Secondary | ICD-10-CM | POA: Diagnosis not present

## 2018-09-12 DIAGNOSIS — N186 End stage renal disease: Secondary | ICD-10-CM | POA: Diagnosis not present

## 2018-09-12 DIAGNOSIS — N2581 Secondary hyperparathyroidism of renal origin: Secondary | ICD-10-CM | POA: Diagnosis not present

## 2018-09-12 DIAGNOSIS — D631 Anemia in chronic kidney disease: Secondary | ICD-10-CM | POA: Diagnosis not present

## 2018-09-12 DIAGNOSIS — D509 Iron deficiency anemia, unspecified: Secondary | ICD-10-CM | POA: Diagnosis not present

## 2018-09-13 DIAGNOSIS — I12 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 stage renal disease: Secondary | ICD-10-CM | POA: Diagnosis not present

## 2018-09-13 DIAGNOSIS — Z992 Dependence on renal dialysis: Secondary | ICD-10-CM | POA: Diagnosis not present

## 2018-09-13 DIAGNOSIS — N186 End stage renal disease: Secondary | ICD-10-CM | POA: Diagnosis not present

## 2018-09-15 DIAGNOSIS — E875 Hyperkalemia: Secondary | ICD-10-CM | POA: Diagnosis not present

## 2018-09-15 DIAGNOSIS — N2581 Secondary hyperparathyroidism of renal origin: Secondary | ICD-10-CM | POA: Diagnosis not present

## 2018-09-15 DIAGNOSIS — D631 Anemia in chronic kidney disease: Secondary | ICD-10-CM | POA: Diagnosis not present

## 2018-09-15 DIAGNOSIS — Z992 Dependence on renal dialysis: Secondary | ICD-10-CM | POA: Diagnosis not present

## 2018-09-15 DIAGNOSIS — N186 End stage renal disease: Secondary | ICD-10-CM | POA: Diagnosis not present

## 2018-09-17 DIAGNOSIS — Z992 Dependence on renal dialysis: Secondary | ICD-10-CM | POA: Diagnosis not present

## 2018-09-17 DIAGNOSIS — D631 Anemia in chronic kidney disease: Secondary | ICD-10-CM | POA: Diagnosis not present

## 2018-09-17 DIAGNOSIS — N2581 Secondary hyperparathyroidism of renal origin: Secondary | ICD-10-CM | POA: Diagnosis not present

## 2018-09-17 DIAGNOSIS — E875 Hyperkalemia: Secondary | ICD-10-CM | POA: Diagnosis not present

## 2018-09-17 DIAGNOSIS — N186 End stage renal disease: Secondary | ICD-10-CM | POA: Diagnosis not present

## 2018-09-19 DIAGNOSIS — N186 End stage renal disease: Secondary | ICD-10-CM | POA: Diagnosis not present

## 2018-09-19 DIAGNOSIS — E875 Hyperkalemia: Secondary | ICD-10-CM | POA: Diagnosis not present

## 2018-09-19 DIAGNOSIS — Z992 Dependence on renal dialysis: Secondary | ICD-10-CM | POA: Diagnosis not present

## 2018-09-19 DIAGNOSIS — D631 Anemia in chronic kidney disease: Secondary | ICD-10-CM | POA: Diagnosis not present

## 2018-09-19 DIAGNOSIS — N2581 Secondary hyperparathyroidism of renal origin: Secondary | ICD-10-CM | POA: Diagnosis not present

## 2018-09-22 DIAGNOSIS — D631 Anemia in chronic kidney disease: Secondary | ICD-10-CM | POA: Diagnosis not present

## 2018-09-22 DIAGNOSIS — N2581 Secondary hyperparathyroidism of renal origin: Secondary | ICD-10-CM | POA: Diagnosis not present

## 2018-09-22 DIAGNOSIS — E875 Hyperkalemia: Secondary | ICD-10-CM | POA: Diagnosis not present

## 2018-09-22 DIAGNOSIS — Z992 Dependence on renal dialysis: Secondary | ICD-10-CM | POA: Diagnosis not present

## 2018-09-22 DIAGNOSIS — N186 End stage renal disease: Secondary | ICD-10-CM | POA: Diagnosis not present

## 2018-09-24 DIAGNOSIS — N2581 Secondary hyperparathyroidism of renal origin: Secondary | ICD-10-CM | POA: Diagnosis not present

## 2018-09-24 DIAGNOSIS — Z992 Dependence on renal dialysis: Secondary | ICD-10-CM | POA: Diagnosis not present

## 2018-09-24 DIAGNOSIS — N186 End stage renal disease: Secondary | ICD-10-CM | POA: Diagnosis not present

## 2018-09-24 DIAGNOSIS — E875 Hyperkalemia: Secondary | ICD-10-CM | POA: Diagnosis not present

## 2018-09-24 DIAGNOSIS — D631 Anemia in chronic kidney disease: Secondary | ICD-10-CM | POA: Diagnosis not present

## 2018-09-25 ENCOUNTER — Other Ambulatory Visit: Payer: Self-pay | Admitting: Surgery

## 2018-09-25 DIAGNOSIS — N2581 Secondary hyperparathyroidism of renal origin: Secondary | ICD-10-CM

## 2018-09-26 DIAGNOSIS — N2581 Secondary hyperparathyroidism of renal origin: Secondary | ICD-10-CM | POA: Diagnosis not present

## 2018-09-26 DIAGNOSIS — E875 Hyperkalemia: Secondary | ICD-10-CM | POA: Diagnosis not present

## 2018-09-26 DIAGNOSIS — Z992 Dependence on renal dialysis: Secondary | ICD-10-CM | POA: Diagnosis not present

## 2018-09-26 DIAGNOSIS — N186 End stage renal disease: Secondary | ICD-10-CM | POA: Diagnosis not present

## 2018-09-26 DIAGNOSIS — D631 Anemia in chronic kidney disease: Secondary | ICD-10-CM | POA: Diagnosis not present

## 2018-09-29 DIAGNOSIS — E875 Hyperkalemia: Secondary | ICD-10-CM | POA: Diagnosis not present

## 2018-09-29 DIAGNOSIS — D631 Anemia in chronic kidney disease: Secondary | ICD-10-CM | POA: Diagnosis not present

## 2018-09-29 DIAGNOSIS — N186 End stage renal disease: Secondary | ICD-10-CM | POA: Diagnosis not present

## 2018-09-29 DIAGNOSIS — Z992 Dependence on renal dialysis: Secondary | ICD-10-CM | POA: Diagnosis not present

## 2018-09-29 DIAGNOSIS — N2581 Secondary hyperparathyroidism of renal origin: Secondary | ICD-10-CM | POA: Diagnosis not present

## 2018-09-30 DIAGNOSIS — N186 End stage renal disease: Secondary | ICD-10-CM | POA: Diagnosis not present

## 2018-09-30 DIAGNOSIS — Z01818 Encounter for other preprocedural examination: Secondary | ICD-10-CM | POA: Diagnosis not present

## 2018-10-01 DIAGNOSIS — D631 Anemia in chronic kidney disease: Secondary | ICD-10-CM | POA: Diagnosis not present

## 2018-10-01 DIAGNOSIS — Z992 Dependence on renal dialysis: Secondary | ICD-10-CM | POA: Diagnosis not present

## 2018-10-01 DIAGNOSIS — N2581 Secondary hyperparathyroidism of renal origin: Secondary | ICD-10-CM | POA: Diagnosis not present

## 2018-10-01 DIAGNOSIS — N186 End stage renal disease: Secondary | ICD-10-CM | POA: Diagnosis not present

## 2018-10-01 DIAGNOSIS — E875 Hyperkalemia: Secondary | ICD-10-CM | POA: Diagnosis not present

## 2018-10-03 DIAGNOSIS — E875 Hyperkalemia: Secondary | ICD-10-CM | POA: Diagnosis not present

## 2018-10-03 DIAGNOSIS — N186 End stage renal disease: Secondary | ICD-10-CM | POA: Diagnosis not present

## 2018-10-03 DIAGNOSIS — N2581 Secondary hyperparathyroidism of renal origin: Secondary | ICD-10-CM | POA: Diagnosis not present

## 2018-10-03 DIAGNOSIS — Z992 Dependence on renal dialysis: Secondary | ICD-10-CM | POA: Diagnosis not present

## 2018-10-03 DIAGNOSIS — D631 Anemia in chronic kidney disease: Secondary | ICD-10-CM | POA: Diagnosis not present

## 2018-10-06 DIAGNOSIS — E875 Hyperkalemia: Secondary | ICD-10-CM | POA: Diagnosis not present

## 2018-10-06 DIAGNOSIS — N186 End stage renal disease: Secondary | ICD-10-CM | POA: Diagnosis not present

## 2018-10-06 DIAGNOSIS — D631 Anemia in chronic kidney disease: Secondary | ICD-10-CM | POA: Diagnosis not present

## 2018-10-06 DIAGNOSIS — N2581 Secondary hyperparathyroidism of renal origin: Secondary | ICD-10-CM | POA: Diagnosis not present

## 2018-10-06 DIAGNOSIS — Z992 Dependence on renal dialysis: Secondary | ICD-10-CM | POA: Diagnosis not present

## 2018-10-08 DIAGNOSIS — E875 Hyperkalemia: Secondary | ICD-10-CM | POA: Diagnosis not present

## 2018-10-08 DIAGNOSIS — N2581 Secondary hyperparathyroidism of renal origin: Secondary | ICD-10-CM | POA: Diagnosis not present

## 2018-10-08 DIAGNOSIS — D631 Anemia in chronic kidney disease: Secondary | ICD-10-CM | POA: Diagnosis not present

## 2018-10-08 DIAGNOSIS — N186 End stage renal disease: Secondary | ICD-10-CM | POA: Diagnosis not present

## 2018-10-08 DIAGNOSIS — Z992 Dependence on renal dialysis: Secondary | ICD-10-CM | POA: Diagnosis not present

## 2018-10-09 ENCOUNTER — Other Ambulatory Visit: Payer: Self-pay

## 2018-10-09 ENCOUNTER — Ambulatory Visit
Admission: RE | Admit: 2018-10-09 | Discharge: 2018-10-09 | Disposition: A | Discharge status: Home / Self Care | Payer: Medicare Other | Source: Ambulatory Visit | Attending: Surgery | Admitting: Surgery

## 2018-10-09 DIAGNOSIS — N2581 Secondary hyperparathyroidism of renal origin: Secondary | ICD-10-CM

## 2018-10-10 DIAGNOSIS — D631 Anemia in chronic kidney disease: Secondary | ICD-10-CM | POA: Diagnosis not present

## 2018-10-10 DIAGNOSIS — Z992 Dependence on renal dialysis: Secondary | ICD-10-CM | POA: Diagnosis not present

## 2018-10-10 DIAGNOSIS — N186 End stage renal disease: Secondary | ICD-10-CM | POA: Diagnosis not present

## 2018-10-10 DIAGNOSIS — N2581 Secondary hyperparathyroidism of renal origin: Secondary | ICD-10-CM | POA: Diagnosis not present

## 2018-10-10 DIAGNOSIS — E875 Hyperkalemia: Secondary | ICD-10-CM | POA: Diagnosis not present

## 2018-10-13 DIAGNOSIS — D631 Anemia in chronic kidney disease: Secondary | ICD-10-CM | POA: Diagnosis not present

## 2018-10-13 DIAGNOSIS — N2581 Secondary hyperparathyroidism of renal origin: Secondary | ICD-10-CM | POA: Diagnosis not present

## 2018-10-13 DIAGNOSIS — N186 End stage renal disease: Secondary | ICD-10-CM | POA: Diagnosis not present

## 2018-10-13 DIAGNOSIS — Z992 Dependence on renal dialysis: Secondary | ICD-10-CM | POA: Diagnosis not present

## 2018-10-13 DIAGNOSIS — E875 Hyperkalemia: Secondary | ICD-10-CM | POA: Diagnosis not present

## 2018-10-14 DIAGNOSIS — Z992 Dependence on renal dialysis: Secondary | ICD-10-CM | POA: Diagnosis not present

## 2018-10-14 DIAGNOSIS — I12 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 stage renal disease: Secondary | ICD-10-CM | POA: Diagnosis not present

## 2018-10-14 DIAGNOSIS — N186 End stage renal disease: Secondary | ICD-10-CM | POA: Diagnosis not present

## 2018-10-16 DIAGNOSIS — Z23 Encounter for immunization: Secondary | ICD-10-CM | POA: Diagnosis not present

## 2018-10-16 DIAGNOSIS — N2581 Secondary hyperparathyroidism of renal origin: Secondary | ICD-10-CM | POA: Diagnosis not present

## 2018-10-16 DIAGNOSIS — Z992 Dependence on renal dialysis: Secondary | ICD-10-CM | POA: Diagnosis not present

## 2018-10-16 DIAGNOSIS — D631 Anemia in chronic kidney disease: Secondary | ICD-10-CM | POA: Diagnosis not present

## 2018-10-16 DIAGNOSIS — N186 End stage renal disease: Secondary | ICD-10-CM | POA: Diagnosis not present

## 2018-10-16 DIAGNOSIS — E875 Hyperkalemia: Secondary | ICD-10-CM | POA: Diagnosis not present

## 2018-10-17 DIAGNOSIS — Z992 Dependence on renal dialysis: Secondary | ICD-10-CM | POA: Diagnosis not present

## 2018-10-17 DIAGNOSIS — N186 End stage renal disease: Secondary | ICD-10-CM | POA: Diagnosis not present

## 2018-10-17 DIAGNOSIS — D631 Anemia in chronic kidney disease: Secondary | ICD-10-CM | POA: Diagnosis not present

## 2018-10-17 DIAGNOSIS — N2581 Secondary hyperparathyroidism of renal origin: Secondary | ICD-10-CM | POA: Diagnosis not present

## 2018-10-17 DIAGNOSIS — E875 Hyperkalemia: Secondary | ICD-10-CM | POA: Diagnosis not present

## 2018-10-18 DIAGNOSIS — R1032 Left lower quadrant pain: Secondary | ICD-10-CM | POA: Diagnosis not present

## 2018-10-20 DIAGNOSIS — N2581 Secondary hyperparathyroidism of renal origin: Secondary | ICD-10-CM | POA: Diagnosis not present

## 2018-10-20 DIAGNOSIS — E875 Hyperkalemia: Secondary | ICD-10-CM | POA: Diagnosis not present

## 2018-10-20 DIAGNOSIS — D631 Anemia in chronic kidney disease: Secondary | ICD-10-CM | POA: Diagnosis not present

## 2018-10-20 DIAGNOSIS — N186 End stage renal disease: Secondary | ICD-10-CM | POA: Diagnosis not present

## 2018-10-20 DIAGNOSIS — Z992 Dependence on renal dialysis: Secondary | ICD-10-CM | POA: Diagnosis not present

## 2018-10-22 DIAGNOSIS — E875 Hyperkalemia: Secondary | ICD-10-CM | POA: Diagnosis not present

## 2018-10-22 DIAGNOSIS — N2581 Secondary hyperparathyroidism of renal origin: Secondary | ICD-10-CM | POA: Diagnosis not present

## 2018-10-22 DIAGNOSIS — Z992 Dependence on renal dialysis: Secondary | ICD-10-CM | POA: Diagnosis not present

## 2018-10-22 DIAGNOSIS — N186 End stage renal disease: Secondary | ICD-10-CM | POA: Diagnosis not present

## 2018-10-22 DIAGNOSIS — D631 Anemia in chronic kidney disease: Secondary | ICD-10-CM | POA: Diagnosis not present

## 2018-10-24 DIAGNOSIS — N2581 Secondary hyperparathyroidism of renal origin: Secondary | ICD-10-CM | POA: Diagnosis not present

## 2018-10-24 DIAGNOSIS — D631 Anemia in chronic kidney disease: Secondary | ICD-10-CM | POA: Diagnosis not present

## 2018-10-24 DIAGNOSIS — Z992 Dependence on renal dialysis: Secondary | ICD-10-CM | POA: Diagnosis not present

## 2018-10-24 DIAGNOSIS — E875 Hyperkalemia: Secondary | ICD-10-CM | POA: Diagnosis not present

## 2018-10-24 DIAGNOSIS — N186 End stage renal disease: Secondary | ICD-10-CM | POA: Diagnosis not present

## 2018-10-27 DIAGNOSIS — D631 Anemia in chronic kidney disease: Secondary | ICD-10-CM | POA: Diagnosis not present

## 2018-10-27 DIAGNOSIS — N2581 Secondary hyperparathyroidism of renal origin: Secondary | ICD-10-CM | POA: Diagnosis not present

## 2018-10-27 DIAGNOSIS — E875 Hyperkalemia: Secondary | ICD-10-CM | POA: Diagnosis not present

## 2018-10-27 DIAGNOSIS — N186 End stage renal disease: Secondary | ICD-10-CM | POA: Diagnosis not present

## 2018-10-27 DIAGNOSIS — Z992 Dependence on renal dialysis: Secondary | ICD-10-CM | POA: Diagnosis not present

## 2018-10-28 DIAGNOSIS — Z202 Contact with and (suspected) exposure to infections with a predominantly sexual mode of transmission: Secondary | ICD-10-CM | POA: Diagnosis not present

## 2018-10-28 DIAGNOSIS — R369 Urethral discharge, unspecified: Secondary | ICD-10-CM | POA: Diagnosis not present

## 2018-10-30 DIAGNOSIS — D631 Anemia in chronic kidney disease: Secondary | ICD-10-CM | POA: Diagnosis not present

## 2018-10-30 DIAGNOSIS — Z992 Dependence on renal dialysis: Secondary | ICD-10-CM | POA: Diagnosis not present

## 2018-10-30 DIAGNOSIS — N186 End stage renal disease: Secondary | ICD-10-CM | POA: Diagnosis not present

## 2018-10-30 DIAGNOSIS — N2581 Secondary hyperparathyroidism of renal origin: Secondary | ICD-10-CM | POA: Diagnosis not present

## 2018-10-30 DIAGNOSIS — E875 Hyperkalemia: Secondary | ICD-10-CM | POA: Diagnosis not present

## 2018-10-31 DIAGNOSIS — D631 Anemia in chronic kidney disease: Secondary | ICD-10-CM | POA: Diagnosis not present

## 2018-10-31 DIAGNOSIS — N2581 Secondary hyperparathyroidism of renal origin: Secondary | ICD-10-CM | POA: Diagnosis not present

## 2018-10-31 DIAGNOSIS — E875 Hyperkalemia: Secondary | ICD-10-CM | POA: Diagnosis not present

## 2018-10-31 DIAGNOSIS — N186 End stage renal disease: Secondary | ICD-10-CM | POA: Diagnosis not present

## 2018-10-31 DIAGNOSIS — Z992 Dependence on renal dialysis: Secondary | ICD-10-CM | POA: Diagnosis not present

## 2018-11-03 DIAGNOSIS — Z992 Dependence on renal dialysis: Secondary | ICD-10-CM | POA: Diagnosis not present

## 2018-11-03 DIAGNOSIS — N186 End stage renal disease: Secondary | ICD-10-CM | POA: Diagnosis not present

## 2018-11-03 DIAGNOSIS — D631 Anemia in chronic kidney disease: Secondary | ICD-10-CM | POA: Diagnosis not present

## 2018-11-03 DIAGNOSIS — E875 Hyperkalemia: Secondary | ICD-10-CM | POA: Diagnosis not present

## 2018-11-03 DIAGNOSIS — N2581 Secondary hyperparathyroidism of renal origin: Secondary | ICD-10-CM | POA: Diagnosis not present

## 2018-11-05 DIAGNOSIS — D631 Anemia in chronic kidney disease: Secondary | ICD-10-CM | POA: Diagnosis not present

## 2018-11-05 DIAGNOSIS — N2581 Secondary hyperparathyroidism of renal origin: Secondary | ICD-10-CM | POA: Diagnosis not present

## 2018-11-05 DIAGNOSIS — N186 End stage renal disease: Secondary | ICD-10-CM | POA: Diagnosis not present

## 2018-11-05 DIAGNOSIS — E875 Hyperkalemia: Secondary | ICD-10-CM | POA: Diagnosis not present

## 2018-11-05 DIAGNOSIS — Z992 Dependence on renal dialysis: Secondary | ICD-10-CM | POA: Diagnosis not present

## 2018-11-07 DIAGNOSIS — E875 Hyperkalemia: Secondary | ICD-10-CM | POA: Diagnosis not present

## 2018-11-07 DIAGNOSIS — N186 End stage renal disease: Secondary | ICD-10-CM | POA: Diagnosis not present

## 2018-11-07 DIAGNOSIS — Z992 Dependence on renal dialysis: Secondary | ICD-10-CM | POA: Diagnosis not present

## 2018-11-07 DIAGNOSIS — D631 Anemia in chronic kidney disease: Secondary | ICD-10-CM | POA: Diagnosis not present

## 2018-11-07 DIAGNOSIS — N2581 Secondary hyperparathyroidism of renal origin: Secondary | ICD-10-CM | POA: Diagnosis not present

## 2018-11-10 DIAGNOSIS — D631 Anemia in chronic kidney disease: Secondary | ICD-10-CM | POA: Diagnosis not present

## 2018-11-10 DIAGNOSIS — Z992 Dependence on renal dialysis: Secondary | ICD-10-CM | POA: Diagnosis not present

## 2018-11-10 DIAGNOSIS — N186 End stage renal disease: Secondary | ICD-10-CM | POA: Diagnosis not present

## 2018-11-10 DIAGNOSIS — N2581 Secondary hyperparathyroidism of renal origin: Secondary | ICD-10-CM | POA: Diagnosis not present

## 2018-11-10 DIAGNOSIS — E875 Hyperkalemia: Secondary | ICD-10-CM | POA: Diagnosis not present

## 2018-11-10 NOTE — Progress Notes (Signed)
STROKE NEUROLOGY FOLLOW UP NOTE  NAME: Carl Pintor Sr. DOB: 03-07-1960  REASON FOR VISIT: stroke/headache follow-up HISTORY FROM: pt and chart  Chief Complaint  Patient presents with  . Follow-up    Yearly f/u. Alone. Rm 9. No new concerns at this time.      History Summary [name] is a 58 y.o. male with history of HTN, ESRD on HD was admitted for acute onset of dizziness on 05/22/14. He was helping his brother for car repair and struck his head at the top to the hood. A couple of hours later, he felt very dizzy and not right. 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 Also, right occipital pole has tiny DWI changes which may related to PRES. He was admitted for further evaluation. Stroke work up including MRA, TTE and CUS unremarkable. LDL 91. During hospitalization, his dizziness resolved and he was discharged with ASA 325 and lipitor.  Patient did have an admission on 12/02/15 for sudden onset RUE jerking, numbness and weakness, resolved prior to ED arrival.  EEG normal. LDL 72 and A1c 4.8. Due to concerning for seizure, he was put on Keppra 750 mg.  Per prior visit notes, patient complained of increase in headache therefore Keppra was stopped with resolution of headache.  02/21/16 visit JX: Since last appointment, HA has been present every other day. Tylenol extra strength 2 tabs, ibuprofen 800mg , Excedrin 2 tabs  - will take one med and if doesn't work will take another. Cool compression will relieve. Works for at least 4-5 hours then HA returns - same severity prior to taking medication. At times, will wake up without HA but other times will have faint HA. He is unsure if increase in HA is due to stress with kidney issues. Every other day HA has been happening for past few months. N/V present with dry heaving. At times sensitive to lights. Denies auras. Has not noticed if  anything makes HA worse. Denies correlation between HA and HD treatments. Pain located in frontal forehead from temrpal to tempral. At times radiates to top of head. Radiates down left side of neck.   04/30/2017 visit JX: During the interval time, pt has been doing well. On topomax twice a day and no more HA. I have received 11/2016 ophthalmology note from Dr. Katy Fitch office and no papilledema seen at that time. MRI and MRV brain repeat showed stable without changes. Otherwise, he is doing well and will have kidney surgery next month for preparation of kidney transplant. BP 125/74.   Update 11/05/2017: Patient is being seen today for scheduled follow-up visit.  He has recently ran out of his Topamax and states for the past week he has had a daily headache in the left frontal area.  He is unsure if this is related to stopping the Topamax or daily stressors.  He plans on undergoing nephrectomy on 11/29/2017 so he is able to be placed on renal transplant list.  He continues to take aspirin 81 mg without side effects of bleeding or bruising.  Continues to take Lipitor without side effects myalgias.  Blood pressure today satisfactory 99/66.  Denies new or worsening stroke/TIA symptoms.  Update 11/10/2018: [name] is being seen today for stroke and headache follow-up.  He has continued on Topamax 25 mg twice daily without side effects.  Headaches have been greatly improved.  He continues on aspirin  81 mg daily and atorvastatin for secondary stroke prevention without side effects.  He denies recent lipid panel obtained we will follow-up with his PCP in order to have this done.  He continues to undergo HD 3 times weekly.  Blood pressure today 116/75.  Denies any worsening stroke/TIA symptoms.     REVIEW OF SYSTEMS: Full 14 system review of systems performed and notable only for those listed below and in HPI above, all others are negative:  headache  The following represents the patient's updated allergies and  side effects list: Allergies  Allergen Reactions  . Infed [Iron Dextran] Other (See Comments)    Decreased BP   . Oxycodone Nausea Only  . Vicodin [Hydrocodone-Acetaminophen] Other (See Comments)    Decreased BP  . Whole Blood Other (See Comments)    Patient refuses whole blood "unless I am going to die"  . Diclofenac Other (See Comments)    Unknown    The neurologically relevant items on the patient's problem list were reviewed on today's visit.  Neurologic Examination  Today's Vitals   11/11/18 0745  BP: 116/75  Pulse: 96  Temp: 98.6 F (37 C)  TempSrc: Oral  Weight: 199 lb 12.8 oz (90.6 kg)  Height: 6\' 5"  (1.956 m)   Body mass index is 23.69 kg/m.  General: well developed, well nourished, pleasant middle-age African-American male, seated, in no evident distress Head: head normocephalic and atraumatic.   Neck: supple with no carotid or supraclavicular bruits Cardiovascular: regular rate and rhythm, no murmurs Musculoskeletal: no deformity Skin:  no rash/petichiae Vascular:  Normal pulses all extremities   Neurologic Exam Mental Status: Awake and fully alert. Oriented to place and time. Recent and remote memory intact. Attention span, concentration and fund of knowledge appropriate. Mood and affect appropriate.  Cranial Nerves: Pupils equal, briskly reactive to light. Extraocular movements full without nystagmus. Visual fields full to confrontation. Hearing intact. Facial sensation intact. Face, tongue, palate moves normally and symmetrically.  Motor: Normal bulk and tone. Normal strength in all tested extremity muscles. Sensory.: intact to touch , pinprick , position and vibratory sensation.  Coordination: Rapid alternating movements normal in all extremities. Finger-to-nose and heel-to-shin performed accurately bilaterally. Gait and Station: Arises from chair without difficulty. Stance is normal. Gait demonstrates normal stride length and balance Reflexes: 1+ and  symmetric. Toes downgoing.       Assessment: [name] is a 58 year old male with history of left corona radiata infarct and possible proximal on 05/2014.  Vascular risk factors include HTN, HLD and ESRD on HD.  Patient is being seen today for scheduled follow-up visit for stroke and headache management. Headaches have been well controlled on topamax 25mg  twice a day   Plan:  -Restart topamax 25mg  twice a day for HA prevention. At HD day, take topamax morning dose after HD. -Refill placed - continue ASA and lipitor for stroke prevention -request PCP check lipid panel at follow-up visit - Follow up with your primary care physician for stroke risk factor modification. Recommend maintain blood pressure goal <130/80, diabetes with hemoglobin A1c goal below 6.5% and lipids with LDL cholesterol goal below 70 mg/dL.  - follow up with nephrology for ESRD on HD - follow up with cardiology and GI regularly. - check BP at home and record  - follow up in 1 year or call earlier if needed  I spent more than 25 minutes of face to face time with the patient. Greater than 50% of time was spent in counseling and  coordination of care. We discussed further HA prevention, continue topamax and follow up with GI/nephrology/ophthalmology as well as importance of secondary stroke prevention measures and ongoing follow-up with PCP.   Frann Rider, AGNP-BC  [facility] [address] [place], [place] [address]  Phone [phone] Fax [phone] Note: This document was prepared with digital dictation and possible smart phrase technology. Any transcriptional errors that result from this process are unintentional.

## 2018-11-11 ENCOUNTER — Encounter: Payer: Self-pay | Admitting: Adult Health

## 2018-11-11 ENCOUNTER — Ambulatory Visit (INDEPENDENT_AMBULATORY_CARE_PROVIDER_SITE_OTHER): Payer: Medicare Other | Admitting: Adult Health

## 2018-11-11 ENCOUNTER — Other Ambulatory Visit: Payer: Self-pay

## 2018-11-11 VITALS — BP 116/75 | HR 96 | Temp 98.6°F | Ht 77.0 in | Wt 199.8 lb

## 2018-11-11 DIAGNOSIS — I10 Essential (primary) hypertension: Secondary | ICD-10-CM | POA: Diagnosis not present

## 2018-11-11 DIAGNOSIS — G441 Vascular headache, not elsewhere classified: Secondary | ICD-10-CM | POA: Diagnosis not present

## 2018-11-11 DIAGNOSIS — Z8673 Personal history of transient ischemic attack (TIA), and cerebral infarction without residual deficits: Secondary | ICD-10-CM | POA: Diagnosis not present

## 2018-11-11 DIAGNOSIS — E785 Hyperlipidemia, unspecified: Secondary | ICD-10-CM

## 2018-11-11 MED ORDER — TOPIRAMATE 25 MG PO TABS: 25.0000 mg | ORAL_TABLET | Freq: Two times a day (BID) | ORAL | 3 refills | Status: DC

## 2018-11-11 NOTE — Patient Instructions (Addendum)
Continuation of Topamax 25 mg twice daily for headache prevention  Continue aspirin 81 mg daily  and Lipitor for secondary stroke prevention  Continue to follow up with PCP regarding cholesterol and blood pressure management - have them check cholesterol levels at next appointment   Continue to monitor blood pressure at home  Maintain strict control of hypertension with blood pressure goal below 130/90, diabetes with hemoglobin A1c goal below 6.5% and cholesterol with LDL cholesterol (bad cholesterol) goal below 70 mg/dL. I also advised the patient to eat a healthy diet with plenty of whole grains, cereals, fruits and vegetables, exercise regularly and maintain ideal body weight.  Followup in the future with me in 1 year or call earlier if needed       Thank you for coming to see [place] at [facility]. I hope we have been able to provide you high quality care today.  You may receive a patient satisfaction survey over the next few weeks. We would appreciate your feedback and comments so that we may continue to improve ourselves and the health of our patients.

## 2018-11-12 DIAGNOSIS — Z992 Dependence on renal dialysis: Secondary | ICD-10-CM | POA: Diagnosis not present

## 2018-11-12 DIAGNOSIS — D631 Anemia in chronic kidney disease: Secondary | ICD-10-CM | POA: Diagnosis not present

## 2018-11-12 DIAGNOSIS — N186 End stage renal disease: Secondary | ICD-10-CM | POA: Diagnosis not present

## 2018-11-12 DIAGNOSIS — E875 Hyperkalemia: Secondary | ICD-10-CM | POA: Diagnosis not present

## 2018-11-12 DIAGNOSIS — N2581 Secondary hyperparathyroidism of renal origin: Secondary | ICD-10-CM | POA: Diagnosis not present

## 2018-11-12 NOTE — Progress Notes (Signed)
I agree with the above plan 

## 2018-11-13 DIAGNOSIS — N186 End stage renal disease: Secondary | ICD-10-CM | POA: Diagnosis not present

## 2018-11-13 DIAGNOSIS — Z992 Dependence on renal dialysis: Secondary | ICD-10-CM | POA: Diagnosis not present

## 2018-11-13 DIAGNOSIS — I12 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 stage renal disease: Secondary | ICD-10-CM | POA: Diagnosis not present

## 2018-11-14 DIAGNOSIS — D631 Anemia in chronic kidney disease: Secondary | ICD-10-CM | POA: Diagnosis not present

## 2018-11-14 DIAGNOSIS — D509 Iron deficiency anemia, unspecified: Secondary | ICD-10-CM | POA: Diagnosis not present

## 2018-11-14 DIAGNOSIS — N186 End stage renal disease: Secondary | ICD-10-CM | POA: Diagnosis not present

## 2018-11-14 DIAGNOSIS — N2581 Secondary hyperparathyroidism of renal origin: Secondary | ICD-10-CM | POA: Diagnosis not present

## 2018-11-14 DIAGNOSIS — Z992 Dependence on renal dialysis: Secondary | ICD-10-CM | POA: Diagnosis not present

## 2018-11-14 DIAGNOSIS — E875 Hyperkalemia: Secondary | ICD-10-CM | POA: Diagnosis not present

## 2018-11-17 DIAGNOSIS — D509 Iron deficiency anemia, unspecified: Secondary | ICD-10-CM | POA: Diagnosis not present

## 2018-11-17 DIAGNOSIS — Z992 Dependence on renal dialysis: Secondary | ICD-10-CM | POA: Diagnosis not present

## 2018-11-17 DIAGNOSIS — E875 Hyperkalemia: Secondary | ICD-10-CM | POA: Diagnosis not present

## 2018-11-17 DIAGNOSIS — N2581 Secondary hyperparathyroidism of renal origin: Secondary | ICD-10-CM | POA: Diagnosis not present

## 2018-11-17 DIAGNOSIS — D631 Anemia in chronic kidney disease: Secondary | ICD-10-CM | POA: Diagnosis not present

## 2018-11-17 DIAGNOSIS — N186 End stage renal disease: Secondary | ICD-10-CM | POA: Diagnosis not present

## 2018-11-19 DIAGNOSIS — N186 End stage renal disease: Secondary | ICD-10-CM | POA: Diagnosis not present

## 2018-11-19 DIAGNOSIS — Z992 Dependence on renal dialysis: Secondary | ICD-10-CM | POA: Diagnosis not present

## 2018-11-19 DIAGNOSIS — E1129 Type 2 diabetes mellitus with other diabetic kidney complication: Secondary | ICD-10-CM | POA: Diagnosis not present

## 2018-11-19 DIAGNOSIS — E875 Hyperkalemia: Secondary | ICD-10-CM | POA: Diagnosis not present

## 2018-11-19 DIAGNOSIS — D631 Anemia in chronic kidney disease: Secondary | ICD-10-CM | POA: Diagnosis not present

## 2018-11-19 DIAGNOSIS — D509 Iron deficiency anemia, unspecified: Secondary | ICD-10-CM | POA: Diagnosis not present

## 2018-11-19 DIAGNOSIS — N2581 Secondary hyperparathyroidism of renal origin: Secondary | ICD-10-CM | POA: Diagnosis not present

## 2018-11-21 DIAGNOSIS — T782XXS Anaphylactic shock, unspecified, sequela: Secondary | ICD-10-CM | POA: Insufficient documentation

## 2018-11-21 DIAGNOSIS — Z992 Dependence on renal dialysis: Secondary | ICD-10-CM | POA: Diagnosis not present

## 2018-11-21 DIAGNOSIS — D509 Iron deficiency anemia, unspecified: Secondary | ICD-10-CM | POA: Diagnosis not present

## 2018-11-21 DIAGNOSIS — N186 End stage renal disease: Secondary | ICD-10-CM | POA: Diagnosis not present

## 2018-11-21 DIAGNOSIS — N2581 Secondary hyperparathyroidism of renal origin: Secondary | ICD-10-CM | POA: Diagnosis not present

## 2018-11-21 DIAGNOSIS — E875 Hyperkalemia: Secondary | ICD-10-CM | POA: Diagnosis not present

## 2018-11-21 DIAGNOSIS — D631 Anemia in chronic kidney disease: Secondary | ICD-10-CM | POA: Diagnosis not present

## 2018-11-24 DIAGNOSIS — Z992 Dependence on renal dialysis: Secondary | ICD-10-CM | POA: Diagnosis not present

## 2018-11-24 DIAGNOSIS — N2581 Secondary hyperparathyroidism of renal origin: Secondary | ICD-10-CM | POA: Diagnosis not present

## 2018-11-24 DIAGNOSIS — N186 End stage renal disease: Secondary | ICD-10-CM | POA: Diagnosis not present

## 2018-11-24 DIAGNOSIS — D631 Anemia in chronic kidney disease: Secondary | ICD-10-CM | POA: Diagnosis not present

## 2018-11-24 DIAGNOSIS — D509 Iron deficiency anemia, unspecified: Secondary | ICD-10-CM | POA: Diagnosis not present

## 2018-11-24 DIAGNOSIS — E875 Hyperkalemia: Secondary | ICD-10-CM | POA: Diagnosis not present

## 2018-11-26 DIAGNOSIS — N2581 Secondary hyperparathyroidism of renal origin: Secondary | ICD-10-CM | POA: Diagnosis not present

## 2018-11-26 DIAGNOSIS — R599 Enlarged lymph nodes, unspecified: Secondary | ICD-10-CM | POA: Diagnosis not present

## 2018-11-26 DIAGNOSIS — D631 Anemia in chronic kidney disease: Secondary | ICD-10-CM | POA: Diagnosis not present

## 2018-11-26 DIAGNOSIS — Z992 Dependence on renal dialysis: Secondary | ICD-10-CM | POA: Diagnosis not present

## 2018-11-26 DIAGNOSIS — D509 Iron deficiency anemia, unspecified: Secondary | ICD-10-CM | POA: Diagnosis not present

## 2018-11-26 DIAGNOSIS — N186 End stage renal disease: Secondary | ICD-10-CM | POA: Diagnosis not present

## 2018-11-26 DIAGNOSIS — E875 Hyperkalemia: Secondary | ICD-10-CM | POA: Diagnosis not present

## 2018-11-28 DIAGNOSIS — N186 End stage renal disease: Secondary | ICD-10-CM | POA: Diagnosis not present

## 2018-11-28 DIAGNOSIS — N2581 Secondary hyperparathyroidism of renal origin: Secondary | ICD-10-CM | POA: Diagnosis not present

## 2018-11-28 DIAGNOSIS — D631 Anemia in chronic kidney disease: Secondary | ICD-10-CM | POA: Diagnosis not present

## 2018-11-28 DIAGNOSIS — D509 Iron deficiency anemia, unspecified: Secondary | ICD-10-CM | POA: Diagnosis not present

## 2018-11-28 DIAGNOSIS — E875 Hyperkalemia: Secondary | ICD-10-CM | POA: Diagnosis not present

## 2018-11-28 DIAGNOSIS — Z992 Dependence on renal dialysis: Secondary | ICD-10-CM | POA: Diagnosis not present

## 2018-12-01 DIAGNOSIS — E875 Hyperkalemia: Secondary | ICD-10-CM | POA: Diagnosis not present

## 2018-12-01 DIAGNOSIS — D509 Iron deficiency anemia, unspecified: Secondary | ICD-10-CM | POA: Diagnosis not present

## 2018-12-01 DIAGNOSIS — N2581 Secondary hyperparathyroidism of renal origin: Secondary | ICD-10-CM | POA: Diagnosis not present

## 2018-12-01 DIAGNOSIS — N186 End stage renal disease: Secondary | ICD-10-CM | POA: Diagnosis not present

## 2018-12-01 DIAGNOSIS — Z992 Dependence on renal dialysis: Secondary | ICD-10-CM | POA: Diagnosis not present

## 2018-12-01 DIAGNOSIS — D631 Anemia in chronic kidney disease: Secondary | ICD-10-CM | POA: Diagnosis not present

## 2018-12-02 ENCOUNTER — Other Ambulatory Visit: Payer: Self-pay | Admitting: Family Medicine

## 2018-12-02 DIAGNOSIS — R2242 Localized swelling, mass and lump, left lower limb: Secondary | ICD-10-CM

## 2018-12-03 DIAGNOSIS — Z992 Dependence on renal dialysis: Secondary | ICD-10-CM | POA: Diagnosis not present

## 2018-12-03 DIAGNOSIS — E875 Hyperkalemia: Secondary | ICD-10-CM | POA: Diagnosis not present

## 2018-12-03 DIAGNOSIS — N186 End stage renal disease: Secondary | ICD-10-CM | POA: Diagnosis not present

## 2018-12-03 DIAGNOSIS — N2581 Secondary hyperparathyroidism of renal origin: Secondary | ICD-10-CM | POA: Diagnosis not present

## 2018-12-03 DIAGNOSIS — D631 Anemia in chronic kidney disease: Secondary | ICD-10-CM | POA: Diagnosis not present

## 2018-12-03 DIAGNOSIS — D509 Iron deficiency anemia, unspecified: Secondary | ICD-10-CM | POA: Diagnosis not present

## 2018-12-05 DIAGNOSIS — Z992 Dependence on renal dialysis: Secondary | ICD-10-CM | POA: Diagnosis not present

## 2018-12-05 DIAGNOSIS — N2581 Secondary hyperparathyroidism of renal origin: Secondary | ICD-10-CM | POA: Diagnosis not present

## 2018-12-05 DIAGNOSIS — N186 End stage renal disease: Secondary | ICD-10-CM | POA: Diagnosis not present

## 2018-12-05 DIAGNOSIS — D509 Iron deficiency anemia, unspecified: Secondary | ICD-10-CM | POA: Diagnosis not present

## 2018-12-05 DIAGNOSIS — E875 Hyperkalemia: Secondary | ICD-10-CM | POA: Diagnosis not present

## 2018-12-05 DIAGNOSIS — D631 Anemia in chronic kidney disease: Secondary | ICD-10-CM | POA: Diagnosis not present

## 2018-12-08 DIAGNOSIS — Z992 Dependence on renal dialysis: Secondary | ICD-10-CM | POA: Diagnosis not present

## 2018-12-08 DIAGNOSIS — E875 Hyperkalemia: Secondary | ICD-10-CM | POA: Diagnosis not present

## 2018-12-08 DIAGNOSIS — D631 Anemia in chronic kidney disease: Secondary | ICD-10-CM | POA: Diagnosis not present

## 2018-12-08 DIAGNOSIS — N2581 Secondary hyperparathyroidism of renal origin: Secondary | ICD-10-CM | POA: Diagnosis not present

## 2018-12-08 DIAGNOSIS — N186 End stage renal disease: Secondary | ICD-10-CM | POA: Diagnosis not present

## 2018-12-08 DIAGNOSIS — D509 Iron deficiency anemia, unspecified: Secondary | ICD-10-CM | POA: Diagnosis not present

## 2018-12-10 DIAGNOSIS — N2581 Secondary hyperparathyroidism of renal origin: Secondary | ICD-10-CM | POA: Diagnosis not present

## 2018-12-10 DIAGNOSIS — D631 Anemia in chronic kidney disease: Secondary | ICD-10-CM | POA: Diagnosis not present

## 2018-12-10 DIAGNOSIS — D509 Iron deficiency anemia, unspecified: Secondary | ICD-10-CM | POA: Diagnosis not present

## 2018-12-10 DIAGNOSIS — E875 Hyperkalemia: Secondary | ICD-10-CM | POA: Diagnosis not present

## 2018-12-10 DIAGNOSIS — N186 End stage renal disease: Secondary | ICD-10-CM | POA: Diagnosis not present

## 2018-12-10 DIAGNOSIS — Z992 Dependence on renal dialysis: Secondary | ICD-10-CM | POA: Diagnosis not present

## 2018-12-11 ENCOUNTER — Ambulatory Visit
Admission: RE | Admit: 2018-12-11 | Discharge: 2018-12-11 | Disposition: A | Discharge status: Home / Self Care | Payer: Medicare Other | Source: Ambulatory Visit | Attending: Family Medicine | Admitting: Family Medicine

## 2018-12-11 DIAGNOSIS — R2242 Localized swelling, mass and lump, left lower limb: Secondary | ICD-10-CM | POA: Diagnosis not present

## 2018-12-11 DIAGNOSIS — D1722 Benign lipomatous neoplasm of skin and subcutaneous tissue of left arm: Secondary | ICD-10-CM | POA: Diagnosis not present

## 2018-12-11 DIAGNOSIS — D485 Neoplasm of uncertain behavior of skin: Secondary | ICD-10-CM | POA: Diagnosis not present

## 2018-12-12 DIAGNOSIS — E875 Hyperkalemia: Secondary | ICD-10-CM | POA: Diagnosis not present

## 2018-12-12 DIAGNOSIS — N2581 Secondary hyperparathyroidism of renal origin: Secondary | ICD-10-CM | POA: Diagnosis not present

## 2018-12-12 DIAGNOSIS — D631 Anemia in chronic kidney disease: Secondary | ICD-10-CM | POA: Diagnosis not present

## 2018-12-12 DIAGNOSIS — D509 Iron deficiency anemia, unspecified: Secondary | ICD-10-CM | POA: Diagnosis not present

## 2018-12-12 DIAGNOSIS — N186 End stage renal disease: Secondary | ICD-10-CM | POA: Diagnosis not present

## 2018-12-12 DIAGNOSIS — Z992 Dependence on renal dialysis: Secondary | ICD-10-CM | POA: Diagnosis not present

## 2018-12-14 DIAGNOSIS — Z992 Dependence on renal dialysis: Secondary | ICD-10-CM | POA: Diagnosis not present

## 2018-12-14 DIAGNOSIS — I12 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 stage renal disease: Secondary | ICD-10-CM | POA: Diagnosis not present

## 2018-12-14 DIAGNOSIS — N186 End stage renal disease: Secondary | ICD-10-CM | POA: Diagnosis not present

## 2018-12-15 DIAGNOSIS — E875 Hyperkalemia: Secondary | ICD-10-CM | POA: Diagnosis not present

## 2018-12-15 DIAGNOSIS — N2581 Secondary hyperparathyroidism of renal origin: Secondary | ICD-10-CM | POA: Diagnosis not present

## 2018-12-15 DIAGNOSIS — Z992 Dependence on renal dialysis: Secondary | ICD-10-CM | POA: Diagnosis not present

## 2018-12-15 DIAGNOSIS — N186 End stage renal disease: Secondary | ICD-10-CM | POA: Diagnosis not present

## 2018-12-15 DIAGNOSIS — D631 Anemia in chronic kidney disease: Secondary | ICD-10-CM | POA: Diagnosis not present

## 2018-12-17 DIAGNOSIS — N2581 Secondary hyperparathyroidism of renal origin: Secondary | ICD-10-CM | POA: Diagnosis not present

## 2018-12-17 DIAGNOSIS — N186 End stage renal disease: Secondary | ICD-10-CM | POA: Diagnosis not present

## 2018-12-17 DIAGNOSIS — E875 Hyperkalemia: Secondary | ICD-10-CM | POA: Diagnosis not present

## 2018-12-17 DIAGNOSIS — Z992 Dependence on renal dialysis: Secondary | ICD-10-CM | POA: Diagnosis not present

## 2018-12-17 DIAGNOSIS — D631 Anemia in chronic kidney disease: Secondary | ICD-10-CM | POA: Diagnosis not present

## 2018-12-19 DIAGNOSIS — Z992 Dependence on renal dialysis: Secondary | ICD-10-CM | POA: Diagnosis not present

## 2018-12-19 DIAGNOSIS — D631 Anemia in chronic kidney disease: Secondary | ICD-10-CM | POA: Diagnosis not present

## 2018-12-19 DIAGNOSIS — N186 End stage renal disease: Secondary | ICD-10-CM | POA: Diagnosis not present

## 2018-12-19 DIAGNOSIS — N2581 Secondary hyperparathyroidism of renal origin: Secondary | ICD-10-CM | POA: Diagnosis not present

## 2018-12-19 DIAGNOSIS — E875 Hyperkalemia: Secondary | ICD-10-CM | POA: Diagnosis not present

## 2018-12-22 DIAGNOSIS — N2581 Secondary hyperparathyroidism of renal origin: Secondary | ICD-10-CM | POA: Diagnosis not present

## 2018-12-22 DIAGNOSIS — N186 End stage renal disease: Secondary | ICD-10-CM | POA: Diagnosis not present

## 2018-12-22 DIAGNOSIS — D631 Anemia in chronic kidney disease: Secondary | ICD-10-CM | POA: Diagnosis not present

## 2018-12-22 DIAGNOSIS — Z992 Dependence on renal dialysis: Secondary | ICD-10-CM | POA: Diagnosis not present

## 2018-12-22 DIAGNOSIS — E875 Hyperkalemia: Secondary | ICD-10-CM | POA: Diagnosis not present

## 2018-12-23 DIAGNOSIS — Z992 Dependence on renal dialysis: Secondary | ICD-10-CM | POA: Diagnosis not present

## 2018-12-23 DIAGNOSIS — I871 Compression of vein: Secondary | ICD-10-CM | POA: Diagnosis not present

## 2018-12-23 DIAGNOSIS — N186 End stage renal disease: Secondary | ICD-10-CM | POA: Diagnosis not present

## 2018-12-23 DIAGNOSIS — R591 Generalized enlarged lymph nodes: Secondary | ICD-10-CM | POA: Diagnosis not present

## 2018-12-24 DIAGNOSIS — N186 End stage renal disease: Secondary | ICD-10-CM | POA: Diagnosis not present

## 2018-12-24 DIAGNOSIS — N2581 Secondary hyperparathyroidism of renal origin: Secondary | ICD-10-CM | POA: Diagnosis not present

## 2018-12-24 DIAGNOSIS — Z992 Dependence on renal dialysis: Secondary | ICD-10-CM | POA: Diagnosis not present

## 2018-12-24 DIAGNOSIS — D631 Anemia in chronic kidney disease: Secondary | ICD-10-CM | POA: Diagnosis not present

## 2018-12-24 DIAGNOSIS — E875 Hyperkalemia: Secondary | ICD-10-CM | POA: Diagnosis not present

## 2018-12-26 DIAGNOSIS — E875 Hyperkalemia: Secondary | ICD-10-CM | POA: Diagnosis not present

## 2018-12-26 DIAGNOSIS — N186 End stage renal disease: Secondary | ICD-10-CM | POA: Diagnosis not present

## 2018-12-26 DIAGNOSIS — Z992 Dependence on renal dialysis: Secondary | ICD-10-CM | POA: Diagnosis not present

## 2018-12-26 DIAGNOSIS — N2581 Secondary hyperparathyroidism of renal origin: Secondary | ICD-10-CM | POA: Diagnosis not present

## 2018-12-26 DIAGNOSIS — D631 Anemia in chronic kidney disease: Secondary | ICD-10-CM | POA: Diagnosis not present

## 2018-12-29 DIAGNOSIS — N186 End stage renal disease: Secondary | ICD-10-CM | POA: Diagnosis not present

## 2018-12-29 DIAGNOSIS — N2581 Secondary hyperparathyroidism of renal origin: Secondary | ICD-10-CM | POA: Diagnosis not present

## 2018-12-29 DIAGNOSIS — E875 Hyperkalemia: Secondary | ICD-10-CM | POA: Diagnosis not present

## 2018-12-29 DIAGNOSIS — D631 Anemia in chronic kidney disease: Secondary | ICD-10-CM | POA: Diagnosis not present

## 2018-12-29 DIAGNOSIS — Z992 Dependence on renal dialysis: Secondary | ICD-10-CM | POA: Diagnosis not present

## 2018-12-31 DIAGNOSIS — D631 Anemia in chronic kidney disease: Secondary | ICD-10-CM | POA: Diagnosis not present

## 2018-12-31 DIAGNOSIS — E875 Hyperkalemia: Secondary | ICD-10-CM | POA: Diagnosis not present

## 2018-12-31 DIAGNOSIS — N186 End stage renal disease: Secondary | ICD-10-CM | POA: Diagnosis not present

## 2018-12-31 DIAGNOSIS — N2581 Secondary hyperparathyroidism of renal origin: Secondary | ICD-10-CM | POA: Diagnosis not present

## 2018-12-31 DIAGNOSIS — Z992 Dependence on renal dialysis: Secondary | ICD-10-CM | POA: Diagnosis not present

## 2019-01-02 DIAGNOSIS — N186 End stage renal disease: Secondary | ICD-10-CM | POA: Diagnosis not present

## 2019-01-02 DIAGNOSIS — D631 Anemia in chronic kidney disease: Secondary | ICD-10-CM | POA: Diagnosis not present

## 2019-01-02 DIAGNOSIS — E875 Hyperkalemia: Secondary | ICD-10-CM | POA: Diagnosis not present

## 2019-01-02 DIAGNOSIS — N2581 Secondary hyperparathyroidism of renal origin: Secondary | ICD-10-CM | POA: Diagnosis not present

## 2019-01-02 DIAGNOSIS — Z992 Dependence on renal dialysis: Secondary | ICD-10-CM | POA: Diagnosis not present

## 2019-01-04 DIAGNOSIS — D631 Anemia in chronic kidney disease: Secondary | ICD-10-CM | POA: Diagnosis not present

## 2019-01-04 DIAGNOSIS — E875 Hyperkalemia: Secondary | ICD-10-CM | POA: Diagnosis not present

## 2019-01-04 DIAGNOSIS — N2581 Secondary hyperparathyroidism of renal origin: Secondary | ICD-10-CM | POA: Diagnosis not present

## 2019-01-04 DIAGNOSIS — N186 End stage renal disease: Secondary | ICD-10-CM | POA: Diagnosis not present

## 2019-01-04 DIAGNOSIS — Z992 Dependence on renal dialysis: Secondary | ICD-10-CM | POA: Diagnosis not present

## 2019-01-06 DIAGNOSIS — Z992 Dependence on renal dialysis: Secondary | ICD-10-CM | POA: Diagnosis not present

## 2019-01-06 DIAGNOSIS — N2581 Secondary hyperparathyroidism of renal origin: Secondary | ICD-10-CM | POA: Diagnosis not present

## 2019-01-06 DIAGNOSIS — E875 Hyperkalemia: Secondary | ICD-10-CM | POA: Diagnosis not present

## 2019-01-06 DIAGNOSIS — N186 End stage renal disease: Secondary | ICD-10-CM | POA: Diagnosis not present

## 2019-01-06 DIAGNOSIS — D631 Anemia in chronic kidney disease: Secondary | ICD-10-CM | POA: Diagnosis not present

## 2019-01-09 DIAGNOSIS — N2581 Secondary hyperparathyroidism of renal origin: Secondary | ICD-10-CM | POA: Diagnosis not present

## 2019-01-09 DIAGNOSIS — Z992 Dependence on renal dialysis: Secondary | ICD-10-CM | POA: Diagnosis not present

## 2019-01-09 DIAGNOSIS — N186 End stage renal disease: Secondary | ICD-10-CM | POA: Diagnosis not present

## 2019-01-09 DIAGNOSIS — E875 Hyperkalemia: Secondary | ICD-10-CM | POA: Diagnosis not present

## 2019-01-09 DIAGNOSIS — D631 Anemia in chronic kidney disease: Secondary | ICD-10-CM | POA: Diagnosis not present

## 2019-01-12 DIAGNOSIS — D631 Anemia in chronic kidney disease: Secondary | ICD-10-CM | POA: Diagnosis not present

## 2019-01-12 DIAGNOSIS — E875 Hyperkalemia: Secondary | ICD-10-CM | POA: Diagnosis not present

## 2019-01-12 DIAGNOSIS — N2581 Secondary hyperparathyroidism of renal origin: Secondary | ICD-10-CM | POA: Diagnosis not present

## 2019-01-12 DIAGNOSIS — Z992 Dependence on renal dialysis: Secondary | ICD-10-CM | POA: Diagnosis not present

## 2019-01-12 DIAGNOSIS — N186 End stage renal disease: Secondary | ICD-10-CM | POA: Diagnosis not present

## 2019-02-10 ENCOUNTER — Ambulatory Visit: Payer: Self-pay | Admitting: Surgery

## 2019-02-13 HISTORY — PX: UPPER GASTROINTESTINAL ENDOSCOPY: SHX188

## 2019-02-25 DIAGNOSIS — R782 Finding of cocaine in blood: Secondary | ICD-10-CM | POA: Insufficient documentation

## 2019-02-27 ENCOUNTER — Other Ambulatory Visit (HOSPITAL_COMMUNITY): Payer: Medicare Other

## 2019-03-03 ENCOUNTER — Ambulatory Visit (HOSPITAL_BASED_OUTPATIENT_CLINIC_OR_DEPARTMENT_OTHER): Admission: RE | Admit: 2019-03-03 | Payer: Medicare Other | Source: Ambulatory Visit | Admitting: Surgery

## 2019-03-03 ENCOUNTER — Encounter (HOSPITAL_BASED_OUTPATIENT_CLINIC_OR_DEPARTMENT_OTHER): Admission: RE | Payer: Self-pay | Source: Ambulatory Visit

## 2019-03-03 SURGERY — EXCISION MASS
Anesthesia: Monitor Anesthesia Care | Laterality: Left

## 2019-03-18 DIAGNOSIS — Z7682 Awaiting organ transplant status: Secondary | ICD-10-CM | POA: Insufficient documentation

## 2019-05-01 ENCOUNTER — Other Ambulatory Visit (HOSPITAL_COMMUNITY): Payer: Medicare Other

## 2019-05-19 NOTE — Patient Instructions (Signed)
DUE TO COVID-19 ONLY ONE VISITOR IS ALLOWED TO COME WITH YOU AND STAY IN THE WAITING ROOM ONLY DURING PRE OP AND PROCEDURE DAY OF SURGERY. THE 1 VISITOR MAY VISIT WITH YOU AFTER SURGERY IN YOUR PRIVATE ROOM DURING VISITING HOURS ONLY!  YOU NEED TO HAVE A COVID 19 TEST ON 05-25-19 @ 2:55 PM, THIS TEST MUST BE DONE BEFORE SURGERY, COME  [place], [place] , [id number].  ([place]) ONCE YOUR COVID TEST IS COMPLETED, PLEASE BEGIN THE QUARANTINE INSTRUCTIONS AS OUTLINED IN YOUR HANDOUT.                Carl Davisson Sr.  05/19/2019   Your procedure is scheduled on: 05-28-19    Report to [facility] Main  Entrance    Report to Admitting at 5:30 AM     Call this number if you have problems the morning of surgery [phone]    Remember: Do not eat food or drink liquids :After Midnight.     Take these medicines the morning of surgery with A SIP OF WATER: Gabapentin (Neurontin), and Omeprazole (Prilosec), prn   BRUSH YOUR TEETH MORNING OF SURGERY AND RINSE YOUR MOUTH OUT, NO CHEWING GUM CANDY OR MINTS.                              You may not have any metal on your body including hair pins and              piercings     Do not wear jewelry, cologne, lotions, powders or deodorant                          Men may shave face and neck.   Do not bring valuables to the hospital. [place].  Contacts, dentures or bridgework may not be worn into surgery.  You may a bring an overnight bag    Patients discharged the day of surgery will not be allowed to drive home. IF YOU ARE HAVING SURGERY AND GOING HOME THE SAME DAY, YOU MUST HAVE AN ADULT TO DRIVE YOU HOME AND BE WITH YOU FOR 24 HOURS. YOU MAY GO HOME BY TAXI OR UBER OR ORTHERWISE, BUT AN ADULT MUST ACCOMPANY YOU HOME AND STAY WITH YOU FOR 24 HOURS.  Name and phone number of your driver:  Special Instructions: N/A              Please read over the following  fact sheets you were given: _____________________________________________________________________             [facility] - Preparing for Surgery Before surgery, you can play an important role.  Because skin is not sterile, your skin needs to be as free of germs as possible.  You can reduce the number of germs on your skin by washing with CHG (chlorahexidine gluconate) soap before surgery.  CHG is an antiseptic cleaner which kills germs and bonds with the skin to continue killing germs even after washing. Please DO NOT use if you have an allergy to CHG or antibacterial soaps.  If your skin becomes reddened/irritated stop using the CHG and inform your nurse when you arrive at Short Stay. Do not shave (including legs and underarms) for at least 48 hours prior to the first  CHG shower.  You may shave your face/neck. Please follow these instructions carefully:  1.  Shower with CHG Soap the night before surgery and the  morning of Surgery.  2.  If you choose to wash your hair, wash your hair first as usual with your  normal  shampoo.  3.  After you shampoo, rinse your hair and body thoroughly to remove the  shampoo.                           4.  Use CHG as you would any other liquid soap.  You can apply chg directly  to the skin and wash                       Gently with a scrungie or clean washcloth.  5.  Apply the CHG Soap to your body ONLY FROM THE NECK DOWN.   Do not use on face/ open                           Wound or open sores. Avoid contact with eyes, ears mouth and genitals (private parts).                       Wash face,  Genitals (private parts) with your normal soap.             6.  Wash thoroughly, paying special attention to the area where your surgery  will be performed.  7.  Thoroughly rinse your body with warm water from the neck down.  8.  DO NOT shower/wash with your normal soap after using and rinsing off  the CHG Soap.                9.  Pat yourself dry with a clean towel.             10.  Wear clean pajamas.            11.  Place clean sheets on your bed the night of your first shower and do not  sleep with pets. Day of Surgery : Do not apply any lotions/deodorants the morning of surgery.  Please wear clean clothes to the hospital/surgery center.  FAILURE TO FOLLOW THESE INSTRUCTIONS MAY RESULT IN THE CANCELLATION OF YOUR SURGERY PATIENT SIGNATURE_________________________________  NURSE SIGNATURE__________________________________  ________________________________________________________________________

## 2019-05-19 NOTE — Progress Notes (Signed)
PCP - Dibas Koirala, MD Cardiologist -   Chest x-ray -  EKG -  Stress Test - 03-25-18  ECHO - 2--11-20 Cardiac Cath -   Sleep Study -  CPAP -   Fasting Blood Sugar -  Checks Blood Sugar _____ times a day  Blood Thinner Instructions: Aspirin Instructions: Last Dose:  Anesthesia review:  Hx of HTN, Stroke, TIA, ESRD, GI Bleed, Borderline Diabetes  Patient denies shortness of breath, fever, cough and chest pain at PAT appointment   Patient verbalized understanding of instructions that were given to them at the PAT appointment. Patient was also instructed that they will need to review over the PAT instructions again at home before surgery.

## 2019-05-20 ENCOUNTER — Encounter (HOSPITAL_COMMUNITY)
Admission: RE | Admit: 2019-05-20 | Discharge: 2019-05-20 | Disposition: A | Discharge status: Home / Self Care | Payer: Medicare Other | Source: Ambulatory Visit | Attending: Urology | Admitting: Urology

## 2019-05-20 NOTE — Progress Notes (Signed)
Called patient on 05-20-19 at 9;00 am for his scheduled pre-surgery telephonic appt. Pt answered calle, and requested a call back because he was at Hemodialysis at the time. Agreed call-back time was 3:00 pm. Writer returned called at 3:00 pm, with a 2nd follow up call at 3:30 pm. Both call attempts went to voicemail. Voice message left x 2.

## 2019-05-21 ENCOUNTER — Encounter (HOSPITAL_COMMUNITY): Payer: Self-pay | Admitting: Surgery

## 2019-05-25 ENCOUNTER — Other Ambulatory Visit (HOSPITAL_COMMUNITY): Payer: Medicare Other

## 2019-05-26 ENCOUNTER — Encounter (HOSPITAL_COMMUNITY): Payer: Self-pay | Admitting: Surgery

## 2019-05-26 DIAGNOSIS — M7989 Other specified soft tissue disorders: Secondary | ICD-10-CM | POA: Diagnosis present

## 2019-05-26 NOTE — H&P (Signed)
General Surgery [facility]  Thomson Herbers Sr DOB: [birth year] Single / Language: [name] / Race: Black or African American Male   History of Present Illness   The patient is a 59 year old male who presents with a soft tissue mass.  CHIEF COMPLAINT: soft tissue mass left shoulder  Patient is well known to my practice. He has developed a soft tissue mass on the left shoulder which is gradually enlarged and is causing him mild discomfort. He has no other such lesions. He does not have any history of trauma. He is interested in having this soft tissue mass removed.  We also discussed his secondary hyperparathyroidism. We are trying to obtain further imaging studies of the neck and chest. I will need to discuss this with his nephrologist in the near future.   Problem List/Past Medical  ENCOUNTER FOR REMOVAL OF STAPLES (Z48.02)  CECAL ULCER (K63.3)  SECONDARY HYPERPARATHYROIDISM, RENAL (N25.81)  MASS OF SOFT TISSUE OF SHOULDER (M79.89)   Past Surgical History Colon Removal - Partial   Diagnostic Studies History Colonoscopy  within last year  Allergies  Diclofenac *ANALGESICS - ANTI-INFLAMMATORY*  Hydrocodone-Acetaminophen *ANALGESICS - OPIOID*  Iron Dextran *CHEMICALS*  OxyCODONE HCl *ANALGESICS - OPIOID*   Medication History Lokelma (10GM Packet, Oral) Active. Topiramate (25MG  Tablet, Oral) Active. Renvela (800MG  Tablet, Oral) Active. Ibuprofen (800MG  Tablet, Oral) Active. Betamethasone Dipropionate Aug (0.05% Ointment, External) Active. Sensipar (90MG  Tablet, Oral) Active. Gabapentin (100MG  Capsule, Oral) Active. Atorvastatin Calcium (10MG  Tablet, Oral) Active. Calcium Acetate (Phos Binder) (667MG  Capsule, Oral) Active. Folic Acid-Vit X4-JOI N86 (0.4-50-0.1MG  Tablet, Oral) Active. Lanthanum Carbonate (1000MG  Packet, Oral) Active. Sorbitol (70% Solution,) Active. Colace (100MG  Capsule, Oral) Active. Aspirin (81MG   Tablet, Oral) Active. Medications Reconciled  Social History  Alcohol use  Occasional alcohol use. Caffeine use  Coffee. No drug use  Tobacco use  Never smoker.  Family History Diabetes Mellitus  Father. Hypertension  Brother, Father, Sister.  Other Problems  Chronic Renal Failure Syndrome  Diverticulosis  Sleep Apnea   Vitals  Weight: 197.4 lb Height: 73in Body Surface Area: 2.14 m Body Mass Index: 26.04 kg/m  Temp.: 97.75F(Thermal Scan)  Pulse: 108 (Regular)  BP: 132/84 (Sitting, Left Arm, Standard)   Physical Exam   GENERAL APPEARANCE Development: normal Nutritional status: normal Gross deformities: none  SKIN Rash, lesions, ulcers: none Induration, erythema: none Nodules: none palpable  EYES Conjunctiva and lids: normal Pupils: equal and reactive Iris: normal bilaterally  EARS, NOSE, MOUTH, THROAT External ears: no lesion or deformity External nose: no lesion or deformity Hearing: grossly normal Patient is wearing a mask.  NECK Symmetric: yes Trachea: midline Thyroid: no palpable nodules in the thyroid bed; well-healed anterior cervical incision  CHEST Respiratory effort: normal Retraction or accessory muscle use: no Breath sounds: normal bilaterally Rales, rhonchi, wheeze: none  CARDIOVASCULAR Auscultation: regular rhythm, normal rate Murmurs: none Pulses: carotid and radial pulse 2+ palpable Lower extremity edema: none Lower extremity varicosities: none  MUSCULOSKELETAL Station and gait: normal Digits and nails: no clubbing or cyanosis Muscle strength: grossly normal all extremities Range of motion: grossly normal all extremities Deformity: Well-healed surgical incision over the left brachial radialis muscle; no palpable masses Soft tissue mass over the left shoulder just lateral to the acromion measuring approximately 4 x 3 x 2 cm in size. This is discrete, mobile, and nontender to  palpation.  LYMPHATIC Cervical: none palpable Supraclavicular: none palpable  PSYCHIATRIC Oriented to person, place, and time: yes Mood and affect: normal for situation Judgment  and insight: appropriate for situation    Assessment & Plan  MASS OF SOFT TISSUE OF SHOULDER (M79.89)  Patient presents with a new complaint of soft tissue mass on the left shoulder. This has gradually increased in size and is causing minor discomfort. He desires surgical excision.  We discussed excision of the soft tissue mass. This most likely will prove to be a lipoma, but the patient has not had any similar type lesions removed in the past. We will plan on submitting it to pathology for review. We discussed the location of the surgical incision. Patient would like to proceed in the near future.  Patient also has persistent secondary hyperparathyroidism. He has undergone previous neck exploration on 2 occasions. He is also had autotransplantation of parathyroid tissue to the left forearm. His most recent nuclear medicine scan shows persistent activity in the anterior chest. I would like to obtain either a CT scan or an MRI scan of the neck and chest to better evaluate prior to any attempted surgical resection. I will discuss this further with the patient's nephrologist, Dr. Jamal Maes, in the near future.  The risks and benefits of the procedure have been discussed at length with the patient. The patient understands the proposed procedure, potential alternative treatments, and the course of recovery to be expected. All of the patient's questions have been answered at this time. The patient wishes to proceed with surgery.  Armandina Gemma, MD [name] Office: [phone]

## 2019-05-27 ENCOUNTER — Other Ambulatory Visit (HOSPITAL_COMMUNITY)
Admission: RE | Admit: 2019-05-27 | Discharge: 2019-05-27 | Disposition: A | Discharge status: Home / Self Care | Payer: Medicare Other | Source: Ambulatory Visit | Attending: Surgery | Admitting: Surgery

## 2019-05-27 DIAGNOSIS — Z01812 Encounter for preprocedural laboratory examination: Secondary | ICD-10-CM | POA: Insufficient documentation

## 2019-05-27 DIAGNOSIS — Z20822 Contact with and (suspected) exposure to covid-19: Secondary | ICD-10-CM | POA: Diagnosis not present

## 2019-05-27 LAB — SARS CORONAVIRUS 2 (TAT 6-24 HRS): SARS Coronavirus 2: NEGATIVE

## 2019-05-27 NOTE — Anesthesia Preprocedure Evaluation (Addendum)
Anesthesia Evaluation  Patient identified by MRN, date of birth, ID band Patient awake    Reviewed: Allergy & Precautions, NPO status , Patient's Chart, lab work & pertinent test results  Airway Mallampati: II  TM Distance: >3 FB Neck ROM: Full    Dental no notable dental hx.    Pulmonary neg pulmonary ROS, former smoker,    Pulmonary exam normal breath sounds clear to auscultation       Cardiovascular hypertension, Normal cardiovascular exam Rhythm:Regular Rate:Normal     Neuro/Psych CVA negative psych ROS   GI/Hepatic negative GI ROS, Neg liver ROS,   Endo/Other  negative endocrine ROS  Renal/GU DialysisRenal disease  negative genitourinary   Musculoskeletal negative musculoskeletal ROS (+)   Abdominal   Peds negative pediatric ROS (+)  Hematology  (+) anemia ,   Anesthesia Other Findings   Reproductive/Obstetrics negative OB ROS                            Anesthesia Physical Anesthesia Plan  ASA: III  Anesthesia Plan: MAC   Post-op Pain Management:    Induction: Intravenous  PONV Risk Score and Plan: 1 and Ondansetron  Airway Management Planned: Simple Face Mask  Additional Equipment:   Intra-op Plan:   Post-operative Plan:   Informed Consent: I have reviewed the patients History and Physical, chart, labs and discussed the procedure including the risks, benefits and alternatives for the proposed anesthesia with the patient or authorized representative who has indicated his/her understanding and acceptance.     Dental advisory given  Plan Discussed with: CRNA and Surgeon  Anesthesia Plan Comments:         Anesthesia Quick Evaluation

## 2019-05-28 ENCOUNTER — Ambulatory Visit (HOSPITAL_COMMUNITY)
Admission: RE | Admit: 2019-05-28 | Discharge: 2019-05-28 | Disposition: A | Discharge status: Home / Self Care | Payer: Medicare Other | Source: Ambulatory Visit | Attending: Surgery | Admitting: Surgery

## 2019-05-28 ENCOUNTER — Encounter (HOSPITAL_COMMUNITY): Payer: Self-pay | Admitting: Surgery

## 2019-05-28 ENCOUNTER — Encounter (HOSPITAL_COMMUNITY)
Admission: RE | Disposition: A | Discharge status: Home / Self Care | Payer: Self-pay | Source: Ambulatory Visit | Attending: Surgery

## 2019-05-28 ENCOUNTER — Ambulatory Visit (HOSPITAL_COMMUNITY): Payer: Medicare Other | Admitting: Physician Assistant

## 2019-05-28 ENCOUNTER — Ambulatory Visit (HOSPITAL_COMMUNITY): Payer: Medicare Other | Admitting: Certified Registered Nurse Anesthetist

## 2019-05-28 DIAGNOSIS — N186 End stage renal disease: Secondary | ICD-10-CM | POA: Insufficient documentation

## 2019-05-28 DIAGNOSIS — D1722 Benign lipomatous neoplasm of skin and subcutaneous tissue of left arm: Secondary | ICD-10-CM | POA: Insufficient documentation

## 2019-05-28 DIAGNOSIS — Z885 Allergy status to narcotic agent status: Secondary | ICD-10-CM | POA: Diagnosis not present

## 2019-05-28 DIAGNOSIS — N2581 Secondary hyperparathyroidism of renal origin: Secondary | ICD-10-CM | POA: Diagnosis not present

## 2019-05-28 DIAGNOSIS — Z7982 Long term (current) use of aspirin: Secondary | ICD-10-CM | POA: Insufficient documentation

## 2019-05-28 DIAGNOSIS — Z87891 Personal history of nicotine dependence: Secondary | ICD-10-CM | POA: Insufficient documentation

## 2019-05-28 DIAGNOSIS — G4730 Sleep apnea, unspecified: Secondary | ICD-10-CM | POA: Diagnosis not present

## 2019-05-28 DIAGNOSIS — R2230 Localized swelling, mass and lump, unspecified upper limb: Secondary | ICD-10-CM | POA: Diagnosis present

## 2019-05-28 DIAGNOSIS — M7989 Other specified soft tissue disorders: Secondary | ICD-10-CM

## 2019-05-28 DIAGNOSIS — Z886 Allergy status to analgesic agent status: Secondary | ICD-10-CM | POA: Insufficient documentation

## 2019-05-28 DIAGNOSIS — Z79899 Other long term (current) drug therapy: Secondary | ICD-10-CM | POA: Diagnosis not present

## 2019-05-28 HISTORY — PX: MASS EXCISION: SHX2000

## 2019-05-28 LAB — BASIC METABOLIC PANEL
Anion gap: 15 (ref 5–15)
BUN: 45 mg/dL — ABNORMAL HIGH (ref 6–20)
CO2: 29 mmol/L (ref 22–32)
Calcium: 9.1 mg/dL (ref 8.9–10.3)
Chloride: 99 mmol/L (ref 98–111)
Creatinine, Ser: 7.94 mg/dL — ABNORMAL HIGH (ref 0.61–1.24)
GFR calc Af Amer: 8 mL/min — ABNORMAL LOW (ref >60)
GFR calc non Af Amer: 7 mL/min — ABNORMAL LOW (ref >60)
Glucose, Bld: 92 mg/dL (ref 70–99)
Potassium: 4.6 mmol/L (ref 3.5–5.1)
Sodium: 143 mmol/L (ref 135–145)

## 2019-05-28 LAB — CBC
HCT: 37.1 % — ABNORMAL LOW (ref 39.0–52.0)
Hemoglobin: 11.5 g/dL — ABNORMAL LOW (ref 13.0–17.0)
MCH: 28.8 pg (ref 26.0–34.0)
MCHC: 31 g/dL (ref 30.0–36.0)
MCV: 92.8 fL (ref 80.0–100.0)
Platelets: 232 10*3/uL (ref 150–400)
RBC: 4 MIL/uL — ABNORMAL LOW (ref 4.22–5.81)
RDW: 17.7 % — ABNORMAL HIGH (ref 11.5–15.5)
WBC: 6 10*3/uL (ref 4.0–10.5)
nRBC: 0 % (ref 0.0–0.2)

## 2019-05-28 LAB — HEMOGLOBIN A1C
Hgb A1c MFr Bld: 5.3 % (ref 4.8–5.6)
Mean Plasma Glucose: 105.41 mg/dL

## 2019-05-28 SURGERY — EXCISION MASS
Anesthesia: Monitor Anesthesia Care | Site: Shoulder | Laterality: Left

## 2019-05-28 MED ORDER — BUPIVACAINE-EPINEPHRINE 0.5% -1:200000 IJ SOLN
INTRAMUSCULAR | Status: DC | PRN
  Administered 2019-05-28: 8 mL

## 2019-05-28 MED ORDER — PROPOFOL 500 MG/50ML IV EMUL
INTRAVENOUS | Status: DC | PRN
  Administered 2019-05-28: 75 ug/kg/min via INTRAVENOUS

## 2019-05-28 MED ORDER — BUPIVACAINE-EPINEPHRINE (PF) 0.5% -1:200000 IJ SOLN
INTRAMUSCULAR | Status: AC
  Filled 2019-05-28: qty 30

## 2019-05-28 MED ORDER — FENTANYL CITRATE (PF) 100 MCG/2ML IJ SOLN: 25.0000 ug | INTRAMUSCULAR | Status: DC | PRN

## 2019-05-28 MED ORDER — MIDAZOLAM HCL 5 MG/5ML IJ SOLN
INTRAMUSCULAR | Status: DC | PRN
  Administered 2019-05-28 (×2): 1 mg via INTRAVENOUS

## 2019-05-28 MED ORDER — CEFAZOLIN SODIUM-DEXTROSE 2-4 GM/100ML-% IV SOLN
INTRAVENOUS | Status: AC
  Filled 2019-05-28: qty 100

## 2019-05-28 MED ORDER — PHENYLEPHRINE HCL (PRESSORS) 10 MG/ML IV SOLN
INTRAVENOUS | Status: AC
  Filled 2019-05-28: qty 1

## 2019-05-28 MED ORDER — 0.9 % SODIUM CHLORIDE (POUR BTL) OPTIME
TOPICAL | Status: DC | PRN
  Administered 2019-05-28: 1000 mL

## 2019-05-28 MED ORDER — MIDAZOLAM HCL 2 MG/2ML IJ SOLN
INTRAMUSCULAR | Status: AC
  Filled 2019-05-28: qty 2

## 2019-05-28 MED ORDER — FENTANYL CITRATE (PF) 100 MCG/2ML IJ SOLN
INTRAMUSCULAR | Status: AC
  Filled 2019-05-28: qty 2

## 2019-05-28 MED ORDER — TRAMADOL HCL 50 MG PO TABS: 50.0000 mg | ORAL_TABLET | Freq: Four times a day (QID) | ORAL | 0 refills | Status: DC | PRN

## 2019-05-28 MED ORDER — SODIUM CHLORIDE 0.9 % IV SOLN: INTRAVENOUS | Status: DC

## 2019-05-28 MED ORDER — ONDANSETRON HCL 4 MG/2ML IJ SOLN
INTRAMUSCULAR | Status: AC
  Filled 2019-05-28: qty 2

## 2019-05-28 MED ORDER — PROPOFOL 1000 MG/100ML IV EMUL
INTRAVENOUS | Status: AC
  Filled 2019-05-28: qty 100

## 2019-05-28 MED ORDER — CEFAZOLIN SODIUM-DEXTROSE 2-3 GM-%(50ML) IV SOLR
INTRAVENOUS | Status: DC | PRN
  Administered 2019-05-28: 2 g via INTRAVENOUS

## 2019-05-28 MED ORDER — LIDOCAINE HCL (PF) 1 % IJ SOLN
INTRAMUSCULAR | Status: AC
  Filled 2019-05-28: qty 30

## 2019-05-28 MED ORDER — ONDANSETRON HCL 4 MG/2ML IJ SOLN
INTRAMUSCULAR | Status: DC | PRN
  Administered 2019-05-28: 4 mg via INTRAVENOUS

## 2019-05-28 MED ORDER — FENTANYL CITRATE (PF) 100 MCG/2ML IJ SOLN
INTRAMUSCULAR | Status: DC | PRN
  Administered 2019-05-28 (×2): 25 ug via INTRAVENOUS

## 2019-05-28 SURGICAL SUPPLY — 38 items
ADH SKN CLS APL DERMABOND .7 (GAUZE/BANDAGES/DRESSINGS) ×1
APL PRP STRL LF DISP 70% ISPRP (MISCELLANEOUS) ×1
BLADE HEX COATED 2.75 (ELECTRODE) ×2 IMPLANT
BLADE SURG 15 STRL LF DISP TIS (BLADE) ×1 IMPLANT
BLADE SURG 15 STRL SS (BLADE) ×2
BLADE SURG SZ10 CARB STEEL (BLADE) IMPLANT
CHLORAPREP W/TINT 26 (MISCELLANEOUS) ×2 IMPLANT
COVER SURGICAL LIGHT HANDLE (MISCELLANEOUS) ×2 IMPLANT
COVER WAND RF STERILE (DRAPES) IMPLANT
DECANTER SPIKE VIAL GLASS SM (MISCELLANEOUS) IMPLANT
DERMABOND ADVANCED (GAUZE/BANDAGES/DRESSINGS) ×1
DERMABOND ADVANCED .7 DNX12 (GAUZE/BANDAGES/DRESSINGS) IMPLANT
DRAPE LAPAROTOMY T 102X78X121 (DRAPES) ×1 IMPLANT
DRAPE LAPAROTOMY TRNSV 102X78 (DRAPES) IMPLANT
DRAPE SHEET LG 3/4 BI-LAMINATE (DRAPES) IMPLANT
ELECT REM PT RETURN 15FT ADLT (MISCELLANEOUS) ×2 IMPLANT
GAUZE SPONGE 4X4 12PLY STRL (GAUZE/BANDAGES/DRESSINGS) IMPLANT
GLOVE BIOGEL PI IND STRL 7.0 (GLOVE) ×1 IMPLANT
GLOVE BIOGEL PI INDICATOR 7.0 (GLOVE) ×1
GLOVE SURG ORTHO 8.0 STRL STRW (GLOVE) ×2 IMPLANT
GOWN STRL REUS W/TWL LRG LVL3 (GOWN DISPOSABLE) ×2 IMPLANT
GOWN STRL REUS W/TWL XL LVL3 (GOWN DISPOSABLE) ×4 IMPLANT
KIT BASIN OR (CUSTOM PROCEDURE TRAY) ×2 IMPLANT
KIT TURNOVER KIT A (KITS) IMPLANT
MARKER SKIN DUAL TIP RULER LAB (MISCELLANEOUS) IMPLANT
NDL HYPO 25X1 1.5 SAFETY (NEEDLE) ×1 IMPLANT
NEEDLE HYPO 25X1 1.5 SAFETY (NEEDLE) ×2 IMPLANT
NS IRRIG 1000ML POUR BTL (IV SOLUTION) ×2 IMPLANT
PACK BASIC VI WITH GOWN DISP (CUSTOM PROCEDURE TRAY) ×2 IMPLANT
PENCIL SMOKE EVACUATOR (MISCELLANEOUS) IMPLANT
SPONGE LAP 4X18 RFD (DISPOSABLE) ×4 IMPLANT
STAPLER VISISTAT 35W (STAPLE) IMPLANT
STRIP CLOSURE SKIN 1/2X4 (GAUZE/BANDAGES/DRESSINGS) IMPLANT
SUT MNCRL AB 3-0 PS2 18 (SUTURE) ×1 IMPLANT
SUT VIC AB 3-0 SH 18 (SUTURE) ×1 IMPLANT
SYR CONTROL 10ML LL (SYRINGE) ×2 IMPLANT
TOWEL OR 17X26 10 PK STRL BLUE (TOWEL DISPOSABLE) ×2 IMPLANT
YANKAUER SUCT BULB TIP 10FT TU (MISCELLANEOUS) IMPLANT

## 2019-05-28 NOTE — Op Note (Signed)
Operative Note  Pre-operative Diagnosis:  Soft tissue mass left shoulder  Post-operative Diagnosis:  same  Surgeon:  Armandina Gemma, MD  Assistant:  none   Procedure:  Excision of soft tissue mass left shoulder, subcutaneous (6x4x2 cm)  Anesthesia:  Local with sedation IV  Estimated Blood Loss:  minimal  Drains: none         Specimen: to pathology  Indications:  Patient has developed a soft tissue mass on the left shoulder which is gradually enlarged and is causing him mild discomfort. He has no other such lesions. He does not have any history of trauma. He is interested in having this soft tissue mass removed.  Procedure:  The patient was seen in the pre-op holding area. The risks, benefits, complications, treatment options, and expected outcomes were previously discussed with the patient. The patient agreed with the proposed plan and has signed the informed consent form.  The patient was brought to the operating room by the surgical team, identified as [name] and the procedure verified. A "time out" was completed and the above information confirmed.  Following administration of intravenous sedation, the patient is turned to a right lateral decubitus position and secured on the operating room table with beanbag and arm support.  After ascertaining that an adequate level of sedation had been achieved, the left shoulder is prepped and draped in the usual aseptic fashion.  Local anesthesia is infiltrated at the site of the mass on the posterior aspect of the shoulder.  Using a #15 blade, a 4 cm incision is made and carried into the subcutaneous tissues.  Hemostasis is achieved with the electrocautery.  Using the electrocautery for hemostasis a soft tissue mass is excised from the subcutaneous space down to the underlying fascia.  Mass is completely excised.  It measures 6 x 4 x 2 cm in dimension.  It is submitted in its entirety to pathology for review.  Incision is closed with  interrupted 3-0 Vicryl sutures in the deep dermis.  Skin edges are reapproximated with a running 3-0 Monocryl subcuticular suture.  Wound is washed and dried and Dermabond is applied as dressing.  Patient is awakened from anesthesia and transported to the recovery room in stable condition.  The patient tolerated the procedure well.   Armandina Gemma, MD [facility] Office: [phone]

## 2019-05-28 NOTE — Transfer of Care (Signed)
Immediate Anesthesia Transfer of Care Note  Patient: Windle Huebert Sr.  Procedure(s) Performed: EXCISION SOFT TISSUE MASS LEFT SHOULDER (Left Shoulder)  Patient Location: PACU  Anesthesia Type:MAC  Level of Consciousness: awake, alert  and oriented  Airway & Oxygen Therapy: Patient Spontanous Breathing and Patient connected to face mask oxygen  Post-op Assessment: Report given to RN and Post -op Vital signs reviewed and stable  Post vital signs: Reviewed and stable  Last Vitals:  Vitals Value Taken Time  BP    Temp    Pulse 86 05/28/19 0820  Resp 45 05/28/19 0820  SpO2 100 % 05/28/19 0820  Vitals shown include unvalidated device data.  Last Pain:  Vitals:   05/28/19 0625  TempSrc: Oral         Complications: No apparent anesthesia complications

## 2019-05-28 NOTE — Interval H&P Note (Signed)
History and Physical Interval Note:  05/28/2019 7:24 AM  Carl Found Sr.  has presented today for surgery, with the diagnosis of soft tissue mass left shoulder.  The various methods of treatment have been discussed with the patient and family. After consideration of risks, benefits and other options for treatment, the patient has consented to    Procedure(s): EXCISION SOFT TISSUE MASS LEFT SHOULDER (Left) as a surgical intervention.    The patient's history has been reviewed, patient examined, no change in status, stable for surgery.  I have reviewed the patient's chart and labs.  Questions were answered to the patient's satisfaction.    Armandina Gemma, MD [facility] Office: [place]

## 2019-05-28 NOTE — Anesthesia Postprocedure Evaluation (Signed)
Anesthesia Post Note  Patient: [name].  Procedure(s) Performed: EXCISION SOFT TISSUE MASS LEFT SHOULDER (Left Shoulder)     Patient location during evaluation: PACU Anesthesia Type: MAC Level of consciousness: awake and alert Pain management: pain level controlled Vital Signs Assessment: post-procedure vital signs reviewed and stable Respiratory status: spontaneous breathing, nonlabored ventilation, respiratory function stable and patient connected to nasal cannula oxygen Cardiovascular status: stable and blood pressure returned to baseline Postop Assessment: no apparent nausea or vomiting Anesthetic complications: no    Last Vitals:  Vitals:   05/28/19 0820 05/28/19 0845  BP: 112/71 110/87  Pulse: 86   Resp: 14   Temp: 36.5 C 36.7 C  SpO2: 100%     Last Pain:  Vitals:   05/28/19 0845  TempSrc:   PainSc: 0-No pain                 Kimi Kroft S

## 2019-05-28 NOTE — Anesthesia Procedure Notes (Signed)
Procedure Name: MAC Date/Time: 05/28/2019 7:25 AM Performed by: Maxwell Caul, CRNA Pre-anesthesia Checklist: Patient identified, Emergency Drugs available, Suction available and Patient being monitored Patient Re-evaluated:Patient Re-evaluated prior to induction Oxygen Delivery Method: Simple face mask

## 2019-05-29 LAB — SURGICAL PATHOLOGY

## 2019-07-31 ENCOUNTER — Inpatient Hospital Stay (HOSPITAL_COMMUNITY)
Admission: EM | Admit: 2019-07-31 | Discharge: 2019-08-05 | DRG: 377 | Disposition: A | Discharge status: Home / Self Care | Payer: Medicare Other | Source: Ambulatory Visit | Attending: Family Medicine | Admitting: Family Medicine

## 2019-07-31 ENCOUNTER — Encounter (HOSPITAL_COMMUNITY): Payer: Self-pay

## 2019-07-31 ENCOUNTER — Emergency Department (HOSPITAL_COMMUNITY): Payer: Medicare Other

## 2019-07-31 ENCOUNTER — Other Ambulatory Visit: Payer: Self-pay

## 2019-07-31 DIAGNOSIS — K625 Hemorrhage of anus and rectum: Secondary | ICD-10-CM

## 2019-07-31 DIAGNOSIS — Z20822 Contact with and (suspected) exposure to covid-19: Secondary | ICD-10-CM | POA: Diagnosis present

## 2019-07-31 DIAGNOSIS — Z8249 Family history of ischemic heart disease and other diseases of the circulatory system: Secondary | ICD-10-CM | POA: Diagnosis not present

## 2019-07-31 DIAGNOSIS — K219 Gastro-esophageal reflux disease without esophagitis: Secondary | ICD-10-CM | POA: Diagnosis present

## 2019-07-31 DIAGNOSIS — G4730 Sleep apnea, unspecified: Secondary | ICD-10-CM | POA: Diagnosis present

## 2019-07-31 DIAGNOSIS — Z531 Procedure and treatment not carried out because of patient's decision for reasons of belief and group pressure: Secondary | ICD-10-CM | POA: Diagnosis present

## 2019-07-31 DIAGNOSIS — N186 End stage renal disease: Secondary | ICD-10-CM | POA: Diagnosis present

## 2019-07-31 DIAGNOSIS — Z885 Allergy status to narcotic agent status: Secondary | ICD-10-CM | POA: Diagnosis not present

## 2019-07-31 DIAGNOSIS — N2581 Secondary hyperparathyroidism of renal origin: Secondary | ICD-10-CM | POA: Diagnosis present

## 2019-07-31 DIAGNOSIS — K2980 Duodenitis without bleeding: Secondary | ICD-10-CM | POA: Diagnosis present

## 2019-07-31 DIAGNOSIS — K5732 Diverticulitis of large intestine without perforation or abscess without bleeding: Secondary | ICD-10-CM | POA: Diagnosis present

## 2019-07-31 DIAGNOSIS — K317 Polyp of stomach and duodenum: Secondary | ICD-10-CM | POA: Diagnosis present

## 2019-07-31 DIAGNOSIS — D62 Acute posthemorrhagic anemia: Secondary | ICD-10-CM | POA: Diagnosis present

## 2019-07-31 DIAGNOSIS — Z87891 Personal history of nicotine dependence: Secondary | ICD-10-CM

## 2019-07-31 DIAGNOSIS — I639 Cerebral infarction, unspecified: Secondary | ICD-10-CM

## 2019-07-31 DIAGNOSIS — Z8673 Personal history of transient ischemic attack (TIA), and cerebral infarction without residual deficits: Secondary | ICD-10-CM

## 2019-07-31 DIAGNOSIS — I959 Hypotension, unspecified: Secondary | ICD-10-CM | POA: Diagnosis present

## 2019-07-31 DIAGNOSIS — K922 Gastrointestinal hemorrhage, unspecified: Secondary | ICD-10-CM | POA: Diagnosis not present

## 2019-07-31 DIAGNOSIS — I9589 Other hypotension: Secondary | ICD-10-CM

## 2019-07-31 DIAGNOSIS — I12 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 stage renal disease: Secondary | ICD-10-CM | POA: Diagnosis present

## 2019-07-31 DIAGNOSIS — K250 Acute gastric ulcer with hemorrhage: Principal | ICD-10-CM | POA: Diagnosis present

## 2019-07-31 DIAGNOSIS — Z992 Dependence on renal dialysis: Secondary | ICD-10-CM

## 2019-07-31 DIAGNOSIS — Z888 Allergy status to other drugs, medicaments and biological substances status: Secondary | ICD-10-CM | POA: Diagnosis not present

## 2019-07-31 DIAGNOSIS — Z823 Family history of stroke: Secondary | ICD-10-CM

## 2019-07-31 DIAGNOSIS — M160 Bilateral primary osteoarthritis of hip: Secondary | ICD-10-CM | POA: Diagnosis present

## 2019-07-31 DIAGNOSIS — E8889 Other specified metabolic disorders: Secondary | ICD-10-CM | POA: Diagnosis present

## 2019-07-31 DIAGNOSIS — K921 Melena: Secondary | ICD-10-CM | POA: Diagnosis present

## 2019-07-31 DIAGNOSIS — E861 Hypovolemia: Secondary | ICD-10-CM | POA: Diagnosis present

## 2019-07-31 DIAGNOSIS — Z8049 Family history of malignant neoplasm of other genital organs: Secondary | ICD-10-CM

## 2019-07-31 DIAGNOSIS — Z7982 Long term (current) use of aspirin: Secondary | ICD-10-CM

## 2019-07-31 DIAGNOSIS — R569 Unspecified convulsions: Secondary | ICD-10-CM | POA: Diagnosis present

## 2019-07-31 DIAGNOSIS — Z79899 Other long term (current) drug therapy: Secondary | ICD-10-CM

## 2019-07-31 DIAGNOSIS — Z832 Family history of diseases of the blood and blood-forming organs and certain disorders involving the immune mechanism: Secondary | ICD-10-CM

## 2019-07-31 DIAGNOSIS — R7303 Prediabetes: Secondary | ICD-10-CM | POA: Diagnosis present

## 2019-07-31 DIAGNOSIS — Z833 Family history of diabetes mellitus: Secondary | ICD-10-CM | POA: Diagnosis not present

## 2019-07-31 DIAGNOSIS — E875 Hyperkalemia: Secondary | ICD-10-CM | POA: Diagnosis present

## 2019-07-31 DIAGNOSIS — I953 Hypotension of hemodialysis: Secondary | ICD-10-CM | POA: Diagnosis present

## 2019-07-31 DIAGNOSIS — I6783 Posterior reversible encephalopathy syndrome: Secondary | ICD-10-CM

## 2019-07-31 DIAGNOSIS — K259 Gastric ulcer, unspecified as acute or chronic, without hemorrhage or perforation: Secondary | ICD-10-CM | POA: Diagnosis not present

## 2019-07-31 HISTORY — DX: Gastrointestinal hemorrhage, unspecified: K92.2

## 2019-07-31 LAB — COMPREHENSIVE METABOLIC PANEL
ALT: 15 U/L (ref 0–44)
AST: 19 U/L (ref 15–41)
Albumin: 3.2 g/dL — ABNORMAL LOW (ref 3.5–5.0)
Alkaline Phosphatase: 60 U/L (ref 38–126)
Anion gap: 16 — ABNORMAL HIGH (ref 5–15)
BUN: 55 mg/dL — ABNORMAL HIGH (ref 6–20)
CO2: 26 mmol/L (ref 22–32)
Calcium: 9.6 mg/dL (ref 8.9–10.3)
Chloride: 96 mmol/L — ABNORMAL LOW (ref 98–111)
Creatinine, Ser: 6.32 mg/dL — ABNORMAL HIGH (ref 0.61–1.24)
GFR calc Af Amer: 10 mL/min — ABNORMAL LOW (ref >60)
GFR calc non Af Amer: 9 mL/min — ABNORMAL LOW (ref >60)
Glucose, Bld: 91 mg/dL (ref 70–99)
Potassium: 4.7 mmol/L (ref 3.5–5.1)
Sodium: 138 mmol/L (ref 135–145)
Total Bilirubin: 0.5 mg/dL (ref 0.3–1.2)
Total Protein: 8.2 g/dL — ABNORMAL HIGH (ref 6.5–8.1)

## 2019-07-31 LAB — CBC
HCT: 35.6 % — ABNORMAL LOW (ref 39.0–52.0)
Hemoglobin: 10.8 g/dL — ABNORMAL LOW (ref 13.0–17.0)
MCH: 28.2 pg (ref 26.0–34.0)
MCHC: 30.3 g/dL (ref 30.0–36.0)
MCV: 93 fL (ref 80.0–100.0)
Platelets: 251 10*3/uL (ref 150–400)
RBC: 3.83 MIL/uL — ABNORMAL LOW (ref 4.22–5.81)
RDW: 16.4 % — ABNORMAL HIGH (ref 11.5–15.5)
WBC: 6.9 10*3/uL (ref 4.0–10.5)
nRBC: 0 % (ref 0.0–0.2)

## 2019-07-31 LAB — TYPE AND SCREEN
ABO/RH(D): O POS
Antibody Screen: NEGATIVE

## 2019-07-31 LAB — LIPASE, BLOOD: Lipase: 70 U/L — ABNORMAL HIGH (ref 11–51)

## 2019-07-31 MED ORDER — PANTOPRAZOLE SODIUM 40 MG IV SOLR: 40.0000 mg | Freq: Once | INTRAVENOUS | Status: DC

## 2019-07-31 MED ORDER — IOHEXOL 300 MG/ML  SOLN
100.0000 mL | Freq: Once | INTRAMUSCULAR | Status: AC | PRN
  Administered 2019-07-31: 100 mL via INTRAVENOUS

## 2019-07-31 MED ORDER — PIPERACILLIN-TAZOBACTAM 3.375 G IVPB 30 MIN
3.3750 g | Freq: Once | INTRAVENOUS | Status: AC
  Administered 2019-08-01: 3.375 g via INTRAVENOUS
  Filled 2019-07-31: qty 50

## 2019-07-31 MED ORDER — PANTOPRAZOLE SODIUM 40 MG IV SOLR: 40.0000 mg | Freq: Two times a day (BID) | INTRAVENOUS | Status: DC

## 2019-07-31 MED ORDER — SODIUM CHLORIDE 0.9 % IV SOLN
8.0000 mg/h | INTRAVENOUS | Status: DC
  Administered 2019-08-01 – 2019-08-02 (×6): 8 mg/h via INTRAVENOUS
  Filled 2019-07-31 (×9): qty 80

## 2019-07-31 MED ORDER — SODIUM CHLORIDE 0.9 % IV SOLN
80.0000 mg | Freq: Once | INTRAVENOUS | Status: AC
  Administered 2019-08-01: 80 mg via INTRAVENOUS
  Filled 2019-07-31: qty 80

## 2019-07-31 MED ORDER — SODIUM CHLORIDE 0.9 % IV BOLUS
1000.0000 mL | Freq: Once | INTRAVENOUS | Status: AC
  Administered 2019-07-31: 1000 mL via INTRAVENOUS

## 2019-07-31 NOTE — ED Notes (Signed)
Pt to  CT at this time   Son in room

## 2019-07-31 NOTE — ED Triage Notes (Signed)
Pt arrives POV for eval of 5 episodes of dark tarry stools today. Pt was at dialysis this afternoon, went to [place] PCP today and was found to be hypotensive there and sent here for further eval.

## 2019-07-31 NOTE — H&P (Addendum)
History and Physical    Shawan Corella Sr. ZHG:[id number] DOB: 11-May-1960 DOA: 07/31/2019  PCP: [name], MD   Patient coming from: Home   Chief Complaint: Dark and tarry stool   HPI: Mostyn Varnell Sr. is a 59 y.o. male with medical history significant for ESRD on hemodialysis, history of CVA, and now presenting to emergency department for evaluation of dark tarry stools and low blood pressure.  Patient reports that he completed dialysis today, went on to have approximately 5 episodes of dark tarry stool, has had some abdominal cramping and discomfort, saw his PCP for evaluation of this, was found to be hypotensive and directed to the ED.  Patient denies any nausea or vomiting.  He denies fevers or chills.  He reports occasional ibuprofen use, last used 2 or 3 days ago.  He takes aspirin 81 mg every other day and uses omeprazole on an as-needed basis.  He reports that his blood pressure usually runs in the low one hundreds systolic.  He denies feeling lightheaded and denies any chest pain.  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  ED Course: Upon arrival to the ED, patient is found to be afebrile, saturating well on room air, blood pressure 85/56, and heart rate 102.  EKG features a sinus rhythm.  CT the abdomen pelvis notable for diffuse diverticular disease with subtle inflammatory changes at the rectosigmoid colon which could reflect mild acute diverticulitis.  Chemistry panel features a normal potassium, normal bicarbonate, and BUN 55.  CBC notable for hemoglobin of 10.8, down from 11.5 in April 2021.  Type and screen was performed in the ED, 1 L of saline was administered, and the patient was given a dose of Zosyn.  COVID-19 screening test not yet resulted.  Patient refused rectal exam.  Review of Systems:  All other systems reviewed and apart from HPI, are negative.  Past Medical History:  Diagnosis Date  . Anemia   . Arthritis   . Borderline diabetes     . Chronic headaches   . Diverticulitis   . ESRD (end stage renal disease) ([place])     On Renal Transplant List," [facility]; MWF" (10/23/2016)  . GI bleed   . Hypertension   . Parathyroid abnormality ([facility])    ectopic parathyroid gland  . Presence of arteriovenous fistula for hemodialysis, primary ([place])    RUE  . Refusal of blood product    NO WHOLE BLOOD PROUCTS  . Secondary hyperparathyroidism ([place])   . Seizures ([place])    one episode in past  . Sleep apnea    doesn't use CPAP anymore since weight loss  . Stroke [facility])    no residual    Past Surgical History:  Procedure Laterality Date  . AV FISTULA PLACEMENT Right    right arm  . CATARACT EXTRACTION W/ INTRAOCULAR LENS  IMPLANT, BILATERAL    . COLON SURGERY    . COLONOSCOPY N/A 08/04/2015   Procedure: COLONOSCOPY;  Surgeon: Jerene Bears, MD;  Location: [facility] ENDOSCOPY;  Service: Endoscopy;  Laterality: N/A;  . graft left arm Left    for dialysis x 2  . INSERTION OF DIALYSIS CATHETER     Rt chest  . LAPAROSCOPIC RIGHT COLECTOMY N/A 08/05/2015   Procedure: LAPAROSCOPIC RIGHT COLECTOMY- ASCENDING;  Surgeon: Stark Klein, MD;  Location: [place];  Service: General;  Laterality: N/A;  . MASS EXCISION Left 05/28/2019   Procedure: EXCISION SOFT TISSUE MASS LEFT SHOULDER;  Surgeon: Armandina Gemma, MD;  Location: [facility];  Service: General;  Laterality: Left;  . PARATHYROIDECTOMY N/A 06/12/2016   Procedure: TOTAL PARATHYROIDECTOMY WITH AUTOTRANSPLANTATION TO LEFT FOREARM;  Surgeon: Armandina Gemma, MD;  Location: [place];  Service: General;  Laterality: N/A;  . PARATHYROIDECTOMY  10/23/2016  . PARATHYROIDECTOMY N/A 10/23/2016   Procedure: PARATHYROIDECTOMY;  Surgeon: Armandina Gemma, MD;  Location: [place];  Service: General;  Laterality: N/A;  . REVISON OF ARTERIOVENOUS FISTULA Right 07/16/2017   Procedure: REVISION OF ARTERIOVENOUS FISTULA  Right ARM;  Surgeon: Waynetta Sandy, MD;  Location: [place];  Service: Vascular;  Laterality: Right;      reports that he quit smoking about 29 years ago. He has never used smokeless tobacco. He reports current alcohol use of about 1.0 standard drink of alcohol per week. He reports that he does not use drugs.  Allergies  Allergen Reactions  . Infed [Iron Dextran] Other (See Comments)    Decreased BP   . Oxycodone Nausea Only  . Vicodin [Hydrocodone-Acetaminophen] Other (See Comments)    Decreased BP  . Whole Blood Other (See Comments)    Patient refuses whole blood "unless I am going to die"  . Diclofenac Other (See Comments)    Unknown    Family History  Problem Relation Age of Onset  . Diabetes Father   . Stroke Father   . Hypertension Father   . Uterine cancer Mother   . Lupus Sister   . Stroke Sister   . Hypertension Sister   . Anuerysm Brother        brain     Prior to Admission medications   Medication Sig Start Date End Date Taking? Authorizing Provider  aspirin EC 81 MG tablet Take 1 tablet (81 mg total) by mouth daily. Patient taking differently: Take 81 mg by mouth every other day.  08/26/14  Yes Rosalin Hawking, MD  atorvastatin (LIPITOR) 10 MG tablet Take 1 tablet (10 mg total) by mouth daily at 6 PM. 05/25/14  Yes Thurnell Lose, MD  bimatoprost (LUMIGAN) 0.03 % ophthalmic solution Place 1 drop into both eyes daily.   Yes [provider]  cholecalciferol (VITAMIN D3) 25 MCG (1000 UNIT) tablet Take 1,000 Units by mouth daily.   Yes [provider]  cinacalcet (SENSIPAR) 90 MG tablet Take 90 mg by mouth daily.  05/20/19  Yes [provider]  gabapentin (NEURONTIN) 100 MG capsule Take 100 mg by mouth 2 (two) times daily.  09/29/14  Yes [provider]  ibuprofen (ADVIL,MOTRIN) 800 MG tablet Take 800 mg by mouth every 8 (eight) hours as needed for mild pain or moderate pain.  10/14/15  Yes [provider]  LOKELMA 10 g PACK packet Take 1 packet by mouth daily. 03/11/19  Yes [provider]  multivitamin (RENA-VIT) TABS tablet  Take 1 tablet by mouth daily.   Yes [provider]  omeprazole (PRILOSEC) 20 MG capsule Take 20 mg by mouth 2 (two) times daily as needed (heartburn).    Yes [provider]  sevelamer carbonate (RENVELA) 800 MG tablet Take 1,600 mg by mouth 3 (three) times daily with meals.    Yes [provider]  TURMERIC PO Take 1 tablet by mouth daily.   Yes [provider]  vitamin E 180 MG (400 UNITS) capsule Take 400 Units by mouth daily.   Yes [provider]  topiramate (TOPAMAX) 25 MG tablet Take 1 tablet (25 mg total) by mouth 2 (two) times daily. On HD days,  take morning dose after HD treatment Patient not taking: Reported on 05/15/2019 11/11/18   Frann Rider, NP  traMADol (ULTRAM) 50 MG tablet Take 1-2 tablets (50-100 mg total) by mouth every 6 (six) hours as needed. Patient not taking: Reported on 07/31/2019 05/28/19   Armandina Gemma, MD    Physical Exam: Vitals:   07/31/19 1916 07/31/19 1924  BP: (!) 85/56   Pulse: (!) 102   Resp: 16   Temp: 98.1 F (36.7 C)   TempSrc: Oral   SpO2: 96%   Weight: 91.6 kg 92 kg  Height: 6\' 1"  (1.854 m) 6\' 1"  (1.854 m)    Constitutional: NAD, calm  Eyes: PERTLA, lids and conjunctivae normal ENMT: Mucous membranes are moist. Posterior pharynx clear of any exudate or lesions.   Neck: normal, supple, no masses, no thyromegaly Respiratory:  no wheezing, no crackles. No accessory muscle use.  Cardiovascular: S1 & S2 heard, regular rate and rhythm. No extremity edema.   Abdomen: No distension, no tenderness, soft. Bowel sounds active.  Musculoskeletal: no clubbing / cyanosis. No joint deformity upper and lower extremities.   Skin: no significant rashes, lesions, ulcers. Warm, dry, well-perfused. Neurologic: No facial asymmetry. Sensation intact. Moving all extremities.  Psychiatric: Alert and oriented to person, place, and situation. Pleasant and cooperative.    Labs and Imaging on Admission: I have personally  reviewed following labs and imaging studies  CBC: Recent Labs  Lab 07/31/19 1932  WBC 6.9  HGB 10.8*  HCT 35.6*  MCV 93.0  PLT 300   Basic Metabolic Panel: Recent Labs  Lab 07/31/19 1932  NA 138  K 4.7  CL 96*  CO2 26  GLUCOSE 91  BUN 55*  CREATININE 6.32*  CALCIUM 9.6   GFR: Estimated Creatinine Clearance: 14 mL/min (A) (by C-G formula based on SCr of 6.32 mg/dL (H)). Liver Function Tests: Recent Labs  Lab 07/31/19 1932  AST 19  ALT 15  ALKPHOS 60  BILITOT 0.5  PROT 8.2*  ALBUMIN 3.2*   Recent Labs  Lab 07/31/19 1932  LIPASE 70*   No results for input(s): AMMONIA in the last 168 hours. Coagulation Profile: No results for input(s): INR, PROTIME in the last 168 hours. Cardiac Enzymes: No results for input(s): CKTOTAL, CKMB, CKMBINDEX, TROPONINI in the last 168 hours. BNP (last 3 results) No results for input(s): PROBNP in the last 8760 hours. HbA1C: No results for input(s): HGBA1C in the last 72 hours. CBG: No results for input(s): GLUCAP in the last 168 hours. Lipid Profile: No results for input(s): CHOL, HDL, LDLCALC, TRIG, CHOLHDL, LDLDIRECT in the last 72 hours. Thyroid Function Tests: No results for input(s): TSH, T4TOTAL, FREET4, T3FREE, THYROIDAB in the last 72 hours. Anemia Panel: No results for input(s): VITAMINB12, FOLATE, FERRITIN, TIBC, IRON, RETICCTPCT in the last 72 hours. Urine analysis:    Component Value Date/Time   COLORURINE YELLOW 08/06/2006 0806   APPEARANCEUR CLEAR 08/06/2006 0806   LABSPEC 1.013 08/06/2006 0806   PHURINE 8.0 08/06/2006 0806   GLUCOSEU 100 (A) 08/06/2006 0806   HGBUR MODERATE (A) 08/06/2006 0806   BILIRUBINUR NEGATIVE 08/06/2006 0806   KETONESUR NEGATIVE 08/06/2006 0806   PROTEINUR > 300 08/06/2006 0806   UROBILINOGEN 0.2 08/06/2006 0806   NITRITE NEGATIVE 08/06/2006 0806   LEUKOCYTESUR SMALL 08/06/2006 0806   Sepsis Labs: @LABRCNTIP (procalcitonin:4,lacticidven:4) )No results found for this or any  previous visit (from the past 240 hour(s)).   Radiological Exams on Admission: CT Abdomen Pelvis W Contrast  Result Date: 07/31/2019  CLINICAL DATA:  Abdomen pain EXAM: CT ABDOMEN AND PELVIS WITH CONTRAST TECHNIQUE: Multidetector CT imaging of the abdomen and pelvis was performed using the standard protocol following bolus administration of intravenous contrast. CONTRAST:  123mL OMNIPAQUE IOHEXOL 300 MG/ML  SOLN COMPARISON:  01/10/2017 FINDINGS: Lower chest: Lung bases demonstrate no acute consolidation or effusion. Hepatobiliary: Gallbladder is dilated. No calcified stone. No biliary dilatation Pancreas: Unremarkable. No pancreatic ductal dilatation or surrounding inflammatory changes. Spleen: Normal in size without focal abnormality. Adrenals/Urinary Tract: Adrenal glands are normal. Interval bilateral nephrectomy. The bladder is empty Stomach/Bowel: The stomach is nonenlarged. No dilated small bowel. Status post right hemicolectomy with ileocolic anastomosis in the right mid abdomen. Diffuse diverticular disease of the colon. Minimal inflammatory change at the rectosigmoid colon best seen on coronal views, series 6, image number 73, likely due to acute diverticulitis. Vascular/Lymphatic: Mild to moderate aortic atherosclerosis. No aneurysm. No suspicious adenopathy Reproductive: Prostate is unremarkable. Other: Negative for free air or free fluid. Small fat containing supraumbilical ventral hernia Musculoskeletal: Mild diffuse sclerosis likely due to chronic kidney disease. Advanced degenerative changes of both hips. Large lucent lesions/probable geodes within the left acetabulum and proximal right femur. IMPRESSION: 1. Diffuse diverticular disease of the colon. Subtle inflammatory changes at the rectosigmoid colon, likely due to mild acute diverticulitis. No perforation or abscess. 2. Interval bilateral nephrectomy 3. Distended gallbladder without calcified stone or surrounding inflammation Electronically  Signed   By: Donavan Foil M.D.   On: 07/31/2019 22:46    EKG: Independently reviewed. Normal sinus rhythm.   Assessment/Plan   1. Acute GI bleeding  - Patient reports several episodes of dark tarry stools today, was hypotensive when he saw his PCP for this and was directed to the ED  - SBP is mid 80's with HR 90-100 and initial Hgb of 10.8 (was 11.5 in April 2021)  - He reports occasional ibuprofen use, takes ASA 81 every other day, and uses PPI only as needed  - He refused rectal exam in ED and does not want blood products due to being a [employer]  - He was given a liter NS bolus in ED  - Start IV PPI, monitor H&H, consult GI (spoke with Dr. Cristina Gong, his care much appreciated)   2. Hypotension  - SBP has been in 80s in ED with MAP mid-upper 60s   - Patient seems to be asymptomatic with this but reports that SBP is usually in low-100s  - He was given a liter NS bolus in ED without much change  - He appears hypovolemic, does not have any respiratory s/s, and could likely tolerate more IVF as needed for BP support  - Will need to monitor closely in stepdown unit   3. Diverticulitis   - Presents with melena and is found to have subtle inflammatory changes around a rectosigmoid diverticula that could reflect acute diverticulitis without perforation or abscess  - He was started on Zosyn in ED, will continue    4. ESRD  - Completed HD on 07/30/19  - No indication for urgent HD on admission but received a liter NS bolus in ED, may need additional IVF overnight, and will need to monitor volume status closely   5. History of CVA; seizures  - Patient reports no recent seizures and no longer takes antiepileptics  - Hold ASA in light of GIB    DVT prophylaxis: SCDs  Code Status: Full  Family Communication: Discussed with patient  Disposition Plan:  Patient is from: Home  Anticipated d/c is to: TBD Anticipated d/c date is: 08/03/19 Patient currently: Critically-ill with acute GI  bleeding   Consults called: [facility], Dr. Cristina Gong  Admission status: Inpatient     Vianne Bulls, MD [facility] Pager: See [email or web address]  If 7AM-7PM, please contact the daytime attending [email or web address]  07/31/2019, 11:56 PM

## 2019-07-31 NOTE — ED Provider Notes (Signed)
[facility] EMERGENCY DEPARTMENT Provider Note   CSN: [id number] Arrival date & time: 07/31/19  1910     History Chief Complaint  Patient presents with  . Melena    Carl Poitras Sr. is a 59 y.o. male.  59 year old male presents with complaint of dark red blood in his stools.  Patient went to dialysis today, completed his session and then went to his PCP office where he was found to be hypotensive and was sent to the emergency room.  Patient reports mild abdominal cramping with approximately 5 episodes of dark bloody stools.  He denies feeling short of breath, weak, dizzy or lightheaded.  Patient states that he had red blood per rectum when he had diverticulitis, no history of upper GI bleed.  Patient is not anticoagulated.  Patient states his normal blood pressure is around 716 systolic, has never had a blood transfusion and would prefer to "try all other means before a transfusion."        Past Medical History:  Diagnosis Date  . Anemia   . Arthritis   . Borderline diabetes   . Chronic headaches   . Diverticulitis   . ESRD (end stage renal disease) ([place])     On Renal Transplant List," [facility]; MWF" (10/23/2016)  . GI bleed   . Hypertension   . Parathyroid abnormality (HCC)    ectopic parathyroid gland  . Presence of arteriovenous fistula for hemodialysis, primary ([place])    RUE  . Refusal of blood product    NO WHOLE BLOOD PROUCTS  . Secondary hyperparathyroidism ([place])   . Seizures ([place])    one episode in past  . Sleep apnea    doesn't use CPAP anymore since weight loss  . Stroke [facility])    no residual    Patient Active Problem List   Diagnosis Date Noted  . Mass of soft tissue of shoulder 05/26/2019  . Hyperkalemia 03/12/2018  . 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 Secondary hyperparathyroidism of renal origin ([place]) 10/23/2016  . Other headache syndrome 08/21/2016  . TIA (transient ischemic attack)   . Essential  hypertension   . Seizures ([place])   . Acute pericarditis   . Atypical chest pain 10/01/2015  . Abnormal EKG 10/01/2015  . 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 Lung nodule   . Diverticulitis of cecum 07/21/2015  . Diverticulitis of large intestine without perforation or abscess without bleeding   . Right lower quadrant pain   . Headache 05/05/2015  . 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 Hypertension secondary to other renal disorders 08/26/2014  . Diverticulosis 07/13/2014  . Lower GI bleed 07/12/2014  . Hematochezia   . Refusal of blood transfusions as patient is Jehovah's Witness   . Parotid mass   . HLD (hyperlipidemia)   . PRES (posterior reversible encephalopathy syndrome)   . Stroke ([place]) 05/23/2014  . Cerebral infarction ([place]) 05/23/2014  . Anemia of renal disease 05/23/2014  . HTN (hypertension) 12/09/2012  . GERD (gastroesophageal reflux disease) 12/09/2012  . ESRD on dialysis ([place]) 12/09/2012  . Gastrointestinal bleeding 11/12/2012    Past Surgical History:  Procedure Laterality Date  . AV FISTULA PLACEMENT Right    right arm  . CATARACT EXTRACTION W/ INTRAOCULAR LENS  IMPLANT, BILATERAL    . COLON SURGERY    . COLONOSCOPY N/A 08/04/2015   Procedure: COLONOSCOPY;  Surgeon: Jerene Bears, MD;  Location: [facility] ENDOSCOPY;  Service: Endoscopy;  Laterality: N/A;  . graft left  arm Left    for dialysis x 2  . INSERTION OF DIALYSIS CATHETER     Rt chest  . LAPAROSCOPIC RIGHT COLECTOMY N/A 08/05/2015   Procedure: LAPAROSCOPIC RIGHT COLECTOMY- ASCENDING;  Surgeon: Stark Klein, MD;  Location: [facility];  Service: General;  Laterality: N/A;  . MASS EXCISION Left 05/28/2019   Procedure: EXCISION SOFT TISSUE MASS LEFT SHOULDER;  Surgeon: Armandina Gemma, MD;  Location: [facility];  Service: General;  Laterality: Left;  . PARATHYROIDECTOMY N/A 06/12/2016   Procedure: TOTAL PARATHYROIDECTOMY WITH AUTOTRANSPLANTATION TO LEFT FOREARM;  Surgeon: Armandina Gemma, MD;  Location:  [facility];  Service: General;  Laterality: N/A;  . PARATHYROIDECTOMY  10/23/2016  . PARATHYROIDECTOMY N/A 10/23/2016   Procedure: PARATHYROIDECTOMY;  Surgeon: Armandina Gemma, MD;  Location: [facility];  Service: General;  Laterality: N/A;  . REVISON OF ARTERIOVENOUS FISTULA Right 07/16/2017   Procedure: REVISION OF ARTERIOVENOUS FISTULA  Right ARM;  Surgeon: Waynetta Sandy, MD;  Location: [facility];  Service: Vascular;  Laterality: Right;       Family History  Problem Relation Age of Onset  . Diabetes Father   . Stroke Father   . Hypertension Father   . Uterine cancer Mother   . Lupus Sister   . Stroke Sister   . Hypertension Sister   . Anuerysm Brother        brain    Social History   Tobacco Use  . Smoking status: Former Smoker    Quit date: 06/19/1990    Years since quitting: 29.1  . Smokeless tobacco: Never Used  . Tobacco comment: rarely cigar  Vaping Use  . Vaping Use: Never used  Substance Use Topics  . Alcohol use: Yes    Alcohol/week: 1.0 standard drink    Types: 1 Cans of beer per week    Comment: occasional  . Drug use: No    Home Medications Prior to Admission medications   Medication Sig Start Date End Date Taking? Authorizing Provider  aspirin EC 81 MG tablet Take 1 tablet (81 mg total) by mouth daily. Patient taking differently: Take 81 mg by mouth every other day.  08/26/14   Rosalin Hawking, MD  atorvastatin (LIPITOR) 10 MG tablet Take 1 tablet (10 mg total) by mouth daily at 6 PM. 05/25/14   Thurnell Lose, MD  cholecalciferol (VITAMIN D3) 25 MCG (1000 UNIT) tablet Take 1,000 Units by mouth daily.    [provider]  gabapentin (NEURONTIN) 100 MG capsule Take 100 mg by mouth 2 (two) times daily.  09/29/14   [provider]  ibuprofen (ADVIL,MOTRIN) 800 MG tablet Take 800 mg by mouth every 8 (eight) hours as needed for mild pain or moderate pain.  10/14/15   [provider]  LOKELMA 10 g PACK packet Take 1 packet by mouth daily. 03/11/19    [provider]  multivitamin (RENA-VIT) TABS tablet Take 1 tablet by mouth daily.    [provider]  omeprazole (PRILOSEC) 20 MG capsule Take 20 mg by mouth 2 (two) times daily as needed (heartburn).     [provider]  psyllium (HYDROCIL/METAMUCIL) 95 % PACK Take 1 packet by mouth every other day.    [provider]  sevelamer carbonate (RENVELA) 800 MG tablet Take 1,600 mg by mouth 3 (three) times daily with meals.     [provider]  topiramate (TOPAMAX) 25 MG tablet Take 1 tablet (25 mg total) by mouth 2 (two) times daily. On HD days, take  morning dose after HD treatment Patient not taking: Reported on 05/15/2019 11/11/18   Frann Rider, NP  traMADol (ULTRAM) 50 MG tablet Take 1-2 tablets (50-100 mg total) by mouth every 6 (six) hours as needed. 05/28/19   Armandina Gemma, MD  TURMERIC PO Take 1 tablet by mouth daily.    [provider]  vitamin E 180 MG (400 UNITS) capsule Take 400 Units by mouth daily.    [provider]    Allergies    Infed [iron dextran], Oxycodone, Vicodin [hydrocodone-acetaminophen], Whole blood, and Diclofenac  Review of Systems   Review of Systems  Constitutional: Negative for chills, diaphoresis and fever.  Respiratory: Negative for shortness of breath.   Cardiovascular: Negative for chest pain.  Gastrointestinal: Positive for abdominal pain, blood in stool and diarrhea. Negative for constipation, nausea and vomiting.  Musculoskeletal: Negative for back pain.  Skin: Negative for rash and wound.  Neurological: Negative for dizziness, weakness and light-headedness.  Hematological: Does not bruise/bleed easily.  Psychiatric/Behavioral: Negative for confusion.  All other systems reviewed and are negative.   Physical Exam Updated Vital Signs BP (!) 85/56 (BP Location: Left Arm)   Pulse (!) 102   Temp 98.1 F (36.7 C) (Oral)   Resp 16   Ht 6\' 1"  (1.854 m)   Wt 92 kg   SpO2 96%   BMI 26.76  kg/m   Physical Exam Vitals and nursing note reviewed.  Constitutional:      General: He is not in acute distress.    Appearance: He is well-developed. He is not diaphoretic.  HENT:     Head: Normocephalic and atraumatic.  Cardiovascular:     Rate and Rhythm: Regular rhythm. Tachycardia present.     Pulses: Normal pulses.     Heart sounds: Normal heart sounds.  Pulmonary:     Effort: Pulmonary effort is normal.     Breath sounds: Normal breath sounds.  Abdominal:     Palpations: Abdomen is soft.     Tenderness: There is no abdominal tenderness.  Musculoskeletal:     Right lower leg: No edema.     Left lower leg: No edema.  Skin:    General: Skin is warm and dry.     Findings: No erythema or rash.     Comments: Right forearm fistula   Neurological:     Mental Status: He is alert and oriented to person, place, and time.  Psychiatric:        Behavior: Behavior normal.     ED Results / Procedures / Treatments   Labs (all labs ordered are listed, but only abnormal results are displayed) Labs Reviewed  CBC - Abnormal; Notable for the following components:      Result Value   RBC 3.83 (*)    Hemoglobin 10.8 (*)    HCT 35.6 (*)    RDW 16.4 (*)    All other components within normal limits  COMPREHENSIVE METABOLIC PANEL  LIPASE, BLOOD  URINALYSIS, ROUTINE W REFLEX MICROSCOPIC  POC OCCULT BLOOD, ED  TYPE AND SCREEN    EKG None  Radiology No results found.  Procedures Procedures (including critical care time)  Medications Ordered in ED Medications  sodium chloride 0.9 % bolus 1,000 mL (has no administration in time range)    ED Course  I have reviewed the triage vital signs and the nursing notes.  Pertinent labs & imaging results that were available during my care of the patient were reviewed by me and considered in  my medical decision making (see chart for details).  Clinical Course as of Jul 31 2051  Fri Jul 31, 5274  1358 59 year old male presents with  complaint of abdominal cramping with 5 episodes of dark bloody stools today.  On exam, patient is well-appearing, mildly tachycardic, hypotensive with a blood pressure of 85/56.  Abdomen is soft and nontender.  Declines rectal exam at this time however is agreeable to provide a stool sample for Hemoccult, patient's nurse was informed of same. Patient was seen by Dr. Hilarie Fredrickson with [facility], last note was plan for colonoscopy 01/2018, patient is unclear why this did not happen in 02/2018 as planned, possibly due to pandemic.  Prior laparoscopic right colectomy for mass. CBC returns with hemoglobin of 10.8, no significant change from prior.  Patient is getting IV fluids for his hypotension and tachycardia.  Care signed out to Dr. Verner Chol, ER attending pending remaining labs and CT.   [LM]    Clinical Course User Index [LM] Roque Lias   MDM Rules/Calculators/A&P                          Final Clinical Impression(s) / ED Diagnoses Final diagnoses:  None    Rx / DC Orders ED Discharge Orders    None       Roque Lias 07/31/19 2053    Sherwood Gambler, MD 07/31/19 [id number]

## 2019-08-01 ENCOUNTER — Encounter (HOSPITAL_COMMUNITY)
Admission: EM | Disposition: A | Discharge status: Home / Self Care | Payer: Self-pay | Source: Ambulatory Visit | Attending: Internal Medicine

## 2019-08-01 HISTORY — PX: ESOPHAGOGASTRODUODENOSCOPY: SHX5428

## 2019-08-01 HISTORY — PX: BIOPSY: SHX5522

## 2019-08-01 LAB — BASIC METABOLIC PANEL
Anion gap: 14 (ref 5–15)
BUN: 72 mg/dL — ABNORMAL HIGH (ref 6–20)
CO2: 25 mmol/L (ref 22–32)
Calcium: 9.2 mg/dL (ref 8.9–10.3)
Chloride: 97 mmol/L — ABNORMAL LOW (ref 98–111)
Creatinine, Ser: 7.04 mg/dL — ABNORMAL HIGH (ref 0.61–1.24)
GFR calc Af Amer: 9 mL/min — ABNORMAL LOW (ref >60)
GFR calc non Af Amer: 8 mL/min — ABNORMAL LOW (ref >60)
Glucose, Bld: 78 mg/dL (ref 70–99)
Potassium: 4.7 mmol/L (ref 3.5–5.1)
Sodium: 136 mmol/L (ref 135–145)

## 2019-08-01 LAB — POC OCCULT BLOOD, ED: Fecal Occult Bld: POSITIVE — AB

## 2019-08-01 LAB — HEMATOCRIT: HCT: 30.6 % — ABNORMAL LOW (ref 39.0–52.0)

## 2019-08-01 LAB — HIV ANTIBODY (ROUTINE TESTING W REFLEX): HIV Screen 4th Generation wRfx: NONREACTIVE

## 2019-08-01 LAB — SARS CORONAVIRUS 2 BY RT PCR (HOSPITAL ORDER, PERFORMED IN ~~LOC~~ HOSPITAL LAB): SARS Coronavirus 2: NEGATIVE

## 2019-08-01 LAB — HEMOGLOBIN: Hemoglobin: 9.7 g/dL — ABNORMAL LOW (ref 13.0–17.0)

## 2019-08-01 SURGERY — EGD (ESOPHAGOGASTRODUODENOSCOPY)
Anesthesia: Moderate Sedation

## 2019-08-01 MED ORDER — MIDAZOLAM HCL (PF) 10 MG/2ML IJ SOLN
INTRAMUSCULAR | Status: DC | PRN
  Administered 2019-08-01: 1 mg via INTRAVENOUS
  Administered 2019-08-01: 2 mg via INTRAVENOUS
  Administered 2019-08-01: 1 mg via INTRAVENOUS

## 2019-08-01 MED ORDER — FENTANYL CITRATE (PF) 100 MCG/2ML IJ SOLN
INTRAMUSCULAR | Status: DC | PRN
  Administered 2019-08-01 (×2): 25 ug via INTRAVENOUS

## 2019-08-01 MED ORDER — SODIUM CHLORIDE 0.9% FLUSH
3.0000 mL | Freq: Two times a day (BID) | INTRAVENOUS | Status: DC
  Administered 2019-08-01 – 2019-08-05 (×6): 3 mL via INTRAVENOUS

## 2019-08-01 MED ORDER — ACETAMINOPHEN 650 MG RE SUPP: 650.0000 mg | Freq: Four times a day (QID) | RECTAL | Status: DC | PRN

## 2019-08-01 MED ORDER — SODIUM CHLORIDE 0.9% FLUSH
3.0000 mL | Freq: Two times a day (BID) | INTRAVENOUS | Status: DC
  Administered 2019-08-03 – 2019-08-05 (×2): 3 mL via INTRAVENOUS

## 2019-08-01 MED ORDER — LATANOPROST 0.005 % OP SOLN
1.0000 [drp] | Freq: Every day | OPHTHALMIC | Status: DC
  Administered 2019-08-01 – 2019-08-04 (×4): 1 [drp] via OPHTHALMIC
  Filled 2019-08-01 (×2): qty 2.5

## 2019-08-01 MED ORDER — ONDANSETRON HCL 4 MG/2ML IJ SOLN: 4.0000 mg | Freq: Four times a day (QID) | INTRAMUSCULAR | Status: DC | PRN

## 2019-08-01 MED ORDER — PIPERACILLIN-TAZOBACTAM 3.375 G IVPB
3.3750 g | Freq: Three times a day (TID) | INTRAVENOUS | Status: DC
  Administered 2019-08-01: 3.375 g via INTRAVENOUS
  Filled 2019-08-01: qty 50

## 2019-08-01 MED ORDER — PIPERACILLIN-TAZOBACTAM IN DEX 2-0.25 GM/50ML IV SOLN
2.2500 g | Freq: Three times a day (TID) | INTRAVENOUS | Status: AC
  Administered 2019-08-01 – 2019-08-02 (×4): 2.25 g via INTRAVENOUS
  Filled 2019-08-01 (×5): qty 50

## 2019-08-01 MED ORDER — SODIUM CHLORIDE 0.9 % IV SOLN: 250.0000 mL | INTRAVENOUS | Status: DC | PRN

## 2019-08-01 MED ORDER — MIDAZOLAM HCL (PF) 5 MG/ML IJ SOLN
INTRAMUSCULAR | Status: AC
  Filled 2019-08-01: qty 3

## 2019-08-01 MED ORDER — SODIUM CHLORIDE 0.9 % IV SOLN: INTRAVENOUS | Status: DC

## 2019-08-01 MED ORDER — SODIUM CHLORIDE 0.9% FLUSH: 3.0000 mL | INTRAVENOUS | Status: DC | PRN

## 2019-08-01 MED ORDER — ONDANSETRON HCL 4 MG PO TABS: 4.0000 mg | ORAL_TABLET | Freq: Four times a day (QID) | ORAL | Status: DC | PRN

## 2019-08-01 MED ORDER — ACETAMINOPHEN 325 MG PO TABS: 650.0000 mg | ORAL_TABLET | Freq: Four times a day (QID) | ORAL | Status: DC | PRN

## 2019-08-01 MED ORDER — DIPHENHYDRAMINE HCL 50 MG/ML IJ SOLN
INTRAMUSCULAR | Status: AC
  Filled 2019-08-01: qty 1

## 2019-08-01 MED ORDER — SODIUM CHLORIDE 0.9% IV SOLUTION: Freq: Once | INTRAVENOUS | Status: DC

## 2019-08-01 MED ORDER — SODIUM CHLORIDE 0.9 % IV SOLN: INTRAVENOUS | Status: AC

## 2019-08-01 MED ORDER — FENTANYL CITRATE (PF) 100 MCG/2ML IJ SOLN
INTRAMUSCULAR | Status: AC
  Filled 2019-08-01: qty 4

## 2019-08-01 NOTE — Op Note (Signed)
[facility] Patient Name: [name] Procedure Date : 08/01/2019 MRN: [id number] Attending MD: Juanita Craver , MD Date of Birth: 08-Jan-1961 CSN: [id number] Age: 59 Admit Type: Inpatient Procedure:                EGD with antral biopsies. Indications:              Melena, Acute post hemorrhagic anemia Providers:                Juanita Craver, MD, Carlyn Reichert, RN, Theodora Blow,                            Technician Referring MD:             Lajuan Lines. Pyrtle, MD Medicines:                Meperidine 50 mg & Midazolam 4 mg IV. Complications:            No immediate complications. Estimated Blood Loss:     Estimated blood loss was minimal. Procedure:                Pre-Anesthesia Assessment: - Prior to the                            procedure, a history and physical was performed,                            and patient medications and allergies were                            reviewed. The patient's tolerance of previous                            anesthesia was also reviewed. The risks and                            benefits of the procedure and the sedation options                            and risks were discussed with the patient. All                            questions were answered, and informed consent was                            obtained. Prior Anticoagulants: The patient has                            taken no previous anticoagulant or antiplatelet                            agents. ASA Grade Assessment: III - A patient with                            severe systemic disease. After reviewing the risks  and benefits, the patient was deemed in                            satisfactory condition to undergo the procedure.                            After obtaining informed consent, the endoscope was                            passed under direct vision. Throughout the                            procedure, the patient's blood pressure, pulse, and                             oxygen saturations were monitored continuously. The                            GIF-H190 ([id number]) Olympus gastroscope was                            introduced through the mouth, and advanced to the                            second part of duodenum. The EGD was accomplished                            without difficulty. The patient tolerated the                            procedure well. Scope In: Scope Out: Findings:      The examined esophagus and GEJ appeared widely patent and there were       transverse folds noted in the esophagus-?EoE-no biopsies were done as       the patient was hypotensive since admission and was a Jehovah's witness.      A single 6 mm sessile polyp with no stigmata of recent bleeding was       found in the cardia-this was not removed or biopsied.      One 7-8 mm non-bleeding cratered gastric ulcer was found in the       prepyloric region of the stomach with edematous mucosa around       it-biopsies were to rule out H. pylori by pathology.      Patchy moderate inflammation characterized by erosions, erythema,       friability and granularity was found in the entire examined stomach.      Patchy moderate inflammation was found in the duodenal bulb and in the       first portion of the duodenum.      No AVM's noted; there was no fresh or old heme in th upper GI tract. Impression:               - Transverse folds in the esophagus ?EoE; widely                            patent esophagus  and GEJ; SCJ was measured at 35 cm.                           - A single 6 mm sessile gastric polyp in the                            cardia-probably a fundic gland polyp-this was not                            removed..                           - 7-8 mm gastric ulcer in the pre-pyloric region                            with edematous mucosa around it-biopsies done to                            rule out H. pylori by pathology.                            - Moderate patchy gastritis in the stomach.                           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 Recommendation:           - Clear liquid diet today; advance as tolerated.                           - Continue present medications; switch to oral                            PPI's in AM.                           - Do not use ANY NSAIDS.                           - Await pathology results.                           - Return to see Dr. Hilarie Fredrickson in his office 2 weeks. Procedure Code(s):        --- Professional ---                           [phone], Esophagogastroduodenoscopy, flexible,                            transoral; with biopsy, single or multiple Diagnosis Code(s):        --- Professional ---                           K92.1, Melena (  includes Hematochezia)                           D62, Acute posthemorrhagic anemia                           K25.9, Gastric ulcer, unspecified as acute or                            chronic, without hemorrhage or perforation                           K29.80, Duodenitis without bleeding                           K29.70, Gastritis, unspecified, without bleeding                           K31.7, Polyp of stomach and duodenum CPT copyright 2019 [employer]. All rights reserved. The codes documented in this report are preliminary and upon coder review may  be revised to meet current compliance requirements. Juanita Craver, MD Juanita Craver, MD 08/01/2019 1:47:42 PM This report has been signed electronically. Number of Addenda: 0

## 2019-08-01 NOTE — Consult Note (Signed)
CROSS COVER [facility] Reason for Consult: Anemia with melena since yesterday. Referring Physician: [facility] hospitalist. Carl Found Sr. is an 60 y.o. male.  HPI: [name] is a 59 year old black male with multiple medical problems listed below, who developed melenic stools shortly after dialysis yesterday with lightheadedness and weakness along with abdominal cramping. Patient present presented to the emergency room after 5 large melenic BMs was found to be hypotensive and was appropriately resuscitated. He has been taking some Ibuprofen for knee pain and takes Aspirin 81 mg every other day. He also takes Omeprazole as needed for reflux on a as needed basis.  Patient denies a previous history of ulcers jaundice or colitis.  He denies noticing any frank hematochezia. 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  He was scheduled to go back for a follow-up colonoscopy but has not done that so far. He denies having any nausea vomiting coffee-ground emesis or hematemesis. He is a Sales promotion account executive Witness and does not want any blood products or blood transfusions.  On admission the CT scan revealed diffuse diverticular disease throughout the colon with subtle inflammatory changes noted in the rectosigmoid colon.  His BUN was elevated at 55 and hemoglobin was 10.8 grams per deciliter down from 11.5 g/dL in April of this year.   Past Medical History:  Diagnosis Date  . Anemia of chronic disease   . Arthritis   . Borderline diabetes   . Chronic headaches   . Diverticulosis   . ESRD (end stage renal disease) ([place])     On Renal Transplant List," [facility]; MWF" (10/23/2016)  . GI bleed   . Hypertension    ectopic parathyroid gland  . Presence of arteriovenous fistula for hemodialysis, primary ([place])    RUE  . Refusal of blood  product/[employer]    NO WHOLE BLOOD PROUCTS  . Secondary hyperparathyroidism ([place])   . Seizures ([place])    one episode in past  . Sleep apnea    doesn't use CPAP anymore since weight loss  . Stroke [facility])    no residual   Past Surgical History:  Procedure Laterality Date  . AV FISTULA PLACEMENT Right    right arm  . CATARACT EXTRACTION W/ INTRAOCULAR LENS  IMPLANT, BILATERAL    . COLON SURGERY    . COLONOSCOPY N/A 08/04/2015   Procedure: COLONOSCOPY;  Surgeon: Jerene Bears, MD;  Location: [facility] ENDOSCOPY;  Service: Endoscopy;  Laterality: N/A;  . graft left arm Left    for dialysis x 2  . INSERTION OF DIALYSIS CATHETER     Rt chest  . LAPAROSCOPIC RIGHT COLECTOMY N/A 08/05/2015   Procedure: LAPAROSCOPIC RIGHT COLECTOMY- ASCENDING;  Surgeon: Stark Klein, MD;  Location: [place];  Service: General;  Laterality: N/A;  . MASS EXCISION Left 05/28/2019   Procedure: EXCISION SOFT TISSUE MASS LEFT SHOULDER;  Surgeon: Armandina Gemma, MD;  Location: [facility];  Service: General;  Laterality: Left;  . PARATHYROIDECTOMY N/A 06/12/2016   Procedure: TOTAL PARATHYROIDECTOMY WITH AUTOTRANSPLANTATION TO LEFT FOREARM;  Surgeon: Armandina Gemma, MD;  Location: [place];  Service: General;  Laterality: N/A;  . PARATHYROIDECTOMY  10/23/2016  . PARATHYROIDECTOMY N/A 10/23/2016   Procedure: PARATHYROIDECTOMY;  Surgeon: Armandina Gemma, MD;  Location: [place];  Service: General;  Laterality: N/A;  . REVISON OF ARTERIOVENOUS FISTULA Right 07/16/2017   Procedure: REVISION  OF ARTERIOVENOUS FISTULA  Right ARM;  Surgeon: Waynetta Sandy, MD;  Location: [place];  Service: Vascular;  Laterality: Right;   Family History  Problem Relation Age of Onset  . Diabetes Father   . Stroke Father   . Hypertension Father   . Uterine cancer Mother   . Lupus Sister   . Stroke Sister   . Hypertension Sister   . Anuerysm Brother        brain   Social History:  reports that he quit smoking about 29 years ago. He has never used  smokeless tobacco. He reports current alcohol use of about 1.0 standard drink of alcohol per week. He reports that he does not use drugs.  Allergies:  Allergies  Allergen Reactions  . Infed [Iron Dextran] Other (See Comments)    Decreased BP   . Oxycodone Nausea Only  . Vicodin [Hydrocodone-Acetaminophen] Other (See Comments)    Decreased BP  . Whole Blood Other (See Comments)    Patient refuses whole blood "unless I am going to die"  . Diclofenac Other (See Comments)    Unknown   Medications: I have reviewed the patient's current medications.  Results for orders placed or performed during the hospital encounter of 07/31/19 (from the past 48 hour(s))  Comprehensive metabolic panel     Status: Abnormal   Collection Time: 07/31/19  7:32 PM  Result Value Ref Range   Sodium 138 135 - 145 mmol/L   Potassium 4.7 3.5 - 5.1 mmol/L   Chloride 96 (L) 98 - 111 mmol/L   CO2 26 22 - 32 mmol/L   Glucose, Bld 91 70 - 99 mg/dL    Comment: Glucose reference range applies only to samples taken after fasting for at least 8 hours.   BUN 55 (H) 6 - 20 mg/dL   Creatinine, Ser 6.32 (H) 0.61 - 1.24 mg/dL   Calcium 9.6 8.9 - 10.3 mg/dL   Total Protein 8.2 (H) 6.5 - 8.1 g/dL   Albumin 3.2 (L) 3.5 - 5.0 g/dL   AST 19 15 - 41 U/L   ALT 15 0 - 44 U/L   Alkaline Phosphatase 60 38 - 126 U/L   Total Bilirubin 0.5 0.3 - 1.2 mg/dL   GFR calc non Af Amer 9 (L) >60 mL/min   GFR calc Af Amer 10 (L) >60 mL/min   Anion gap 16 (H) 5 - 15    Comment: Performed at [facility] [address], [place], [place] [address]  CBC     Status: Abnormal   Collection Time: 07/31/19  7:32 PM  Result Value Ref Range   WBC 6.9 4.0 - 10.5 K/uL   RBC 3.83 (L) 4.22 - 5.81 MIL/uL   Hemoglobin 10.8 (L) 13.0 - 17.0 g/dL   HCT 35.6 (L) 39 - 52 %   MCV 93.0 80.0 - 100.0 fL   MCH 28.2 26.0 - 34.0 pg   MCHC 30.3 30.0 - 36.0 g/dL   RDW 16.4 (H) 11.5 - 15.5 %   Platelets 251 150 - 400 K/uL   nRBC 0.0 0.0 - 0.2 %     Comment: Performed at [facility], [place] [address] [place],  [address]  Lipase, blood     Status: Abnormal   Collection Time: 07/31/19  7:32 PM  Result Value Ref Range   Lipase 70 (H) 11 - 51 U/L    Comment: Performed at [facility], [place] [address], [place], [place]  [address]  Type and screen [place]     Status: None   Collection Time: 07/31/19  7:34 PM  Result Value Ref Range   ABO/RH(D) O POS    Antibody Screen NEG    Sample Expiration      08/03/2019,2359 Performed at [facility], [place] [address] [place], [place] [address]   SARS Coronavirus 2 by RT PCR (hospital order, performed in [facility] hospital lab) Nasopharyngeal Nasopharyngeal Swab     Status: None   Collection Time: 08/01/19 12:01 AM   Specimen: Nasopharyngeal Swab  Result Value Ref Range   SARS Coronavirus 2 NEGATIVE NEGATIVE    Comment: (NOTE) SARS-CoV-2 target nucleic acids are NOT DETECTED.  The SARS-CoV-2 RNA is generally detectable in upper and lower respiratory specimens during the acute phase of infection. The lowest concentration of SARS-CoV-2 viral copies this assay can detect is 250 copies / mL. A negative result does not preclude SARS-CoV-2 infection and should not be used as the sole basis for treatment or other patient management decisions.  A negative result may occur with improper specimen collection / handling, submission of specimen other than nasopharyngeal swab, presence of viral mutation(s) within the areas targeted by this assay, and inadequate number of viral copies (<250 copies / mL). A negative result must be combined with clinical observations, patient history, and epidemiological information.  Fact Sheet for Patients:   StrictlyIdeas.no  Fact Sheet for Healthcare Providers: BankingDealers.co.za  This test is not yet approved or  cleared by the [place] FDA and has been authorized for  detection and/or diagnosis of SARS-CoV-2 by FDA under an Emergency Use Authorization (EUA).  This EUA will remain in effect (meaning this test can be used) for the duration of the COVID-19 declaration under Section 564(b)(1) of the Act, 21 U.S.C. section 360bbb-3(b)(1), unless the authorization is terminated or revoked sooner.  Performed at [facility], [place] [address] [place], [place] [address]   POC occult blood, ED     Status: Abnormal   Collection Time: 08/01/19 12:47 AM  Result Value Ref Range   Fecal Occult Bld POSITIVE (A) NEGATIVE  HIV Antibody (routine testing w rflx)     Status: None   Collection Time: 08/01/19  3:04 AM  Result Value Ref Range   HIV Screen 4th Generation wRfx Non Reactive Non Reactive    Comment: Performed at [facility], [place] [address], [place], [place] [address]  Basic metabolic panel     Status: Abnormal   Collection Time: 08/01/19  3:04 AM  Result Value Ref Range   Sodium 136 135 - 145 mmol/L   Potassium 4.7 3.5 - 5.1 mmol/L   Chloride 97 (L) 98 - 111 mmol/L   CO2 25 22 - 32 mmol/L   Glucose, Bld 78 70 - 99 mg/dL    Comment: Glucose reference range applies only to samples taken after fasting for at least 8 hours.   BUN 72 (H) 6 - 20 mg/dL   Creatinine, Ser 7.04 (H) 0.61 - 1.24 mg/dL   Calcium 9.2 8.9 - 10.3 mg/dL   GFR calc non Af Amer 8 (L) >60 mL/min   GFR calc Af Amer 9 (L) >60 mL/min   Anion gap 14 5 - 15    Comment: Performed at [facility] [address] [place],  [address]  Hemoglobin     Status: Abnormal   Collection Time: 08/01/19  3:04 AM  Result Value  Ref Range   Hemoglobin 9.7 (L) 13.0 - 17.0 g/dL    Comment: Performed at [facility] [address] [place], [place] [address]  Hematocrit     Status: Abnormal   Collection Time: 08/01/19  3:04 AM  Result Value Ref Range   HCT 30.6 (L) 39 - 52 %    Comment: Performed at [facility], [place] [address], [place], [place] [address]   CT Abdomen  Pelvis W Contrast  Result Date: 07/31/2019 CLINICAL DATA:  Abdomen pain EXAM: CT ABDOMEN AND PELVIS WITH CONTRAST TECHNIQUE: Multidetector CT imaging of the abdomen and pelvis was performed using the standard protocol following bolus administration of intravenous contrast. CONTRAST:  115mL OMNIPAQUE IOHEXOL 300 MG/ML  SOLN COMPARISON:  01/10/2017 FINDINGS: Lower chest: Lung bases demonstrate no acute consolidation or effusion. Hepatobiliary: Gallbladder is dilated. No calcified stone. No biliary dilatation Pancreas: Unremarkable. No pancreatic ductal dilatation or surrounding inflammatory changes. Spleen: Normal in size without focal abnormality. Adrenals/Urinary Tract: Adrenal glands are normal. Interval bilateral nephrectomy. The bladder is empty Stomach/Bowel: The stomach is nonenlarged. No dilated small bowel. Status post right hemicolectomy with ileocolic anastomosis in the right mid abdomen. Diffuse diverticular disease of the colon. Minimal inflammatory change at the rectosigmoid colon best seen on coronal views, series 6, image number 31, likely due to acute diverticulitis. Vascular/Lymphatic: Mild to moderate aortic atherosclerosis. No aneurysm. No suspicious adenopathy Reproductive: Prostate is unremarkable. Other: Negative for free air or free fluid. Small fat containing supraumbilical ventral hernia Musculoskeletal: Mild diffuse sclerosis likely due to chronic kidney disease. Advanced degenerative changes of both hips. Large lucent lesions/probable geodes within the left acetabulum and proximal right femur. IMPRESSION: 1. Diffuse diverticular disease of the colon. Subtle inflammatory changes at the rectosigmoid colon, likely due to mild acute diverticulitis. No perforation or abscess. 2. Interval bilateral nephrectomy 3. Distended gallbladder without calcified stone or surrounding inflammation Electronically Signed   By: Donavan Foil M.D.   On: 07/31/2019 22:46   ROS:  As stated above in the HPI  otherwise negative.  Blood pressure (!) 120/96, pulse 90, temperature 98.2 F (36.8 C), temperature source Oral, resp. rate 18, height 6\' 1"  (1.854 m), weight 91.2 kg, SpO2 97 %.  PE: Gen: NAD, Alert and Oriented HEENT:  [place]/AT, EOMI Neck: Supple, no LAD Lungs: CTA Bilaterally CV: RRR without M/G/R ABM: Soft, NTND, +BS; there is a midline scar below the umbilicus from above-mentioned surgeries. Ext: No C/C/E  Assessment/Plan: 1) Melena with posthemorrhagic anemia-an EGD is planned for the patient today.  He has strongly been counseled against the use of all nonsteroidals considering his history of renal disease and current problems with melena. Further recommendation made after EGD has been done. I suspect he may have a bleeding AVM or peptic ulcer disease from the recent use of nonsteroidals. 2) Diffuse diverticular disease. Patient had a right hemicolectomy for necroinflammatory mass in 2017 3) End-stage renal disease on hemodialysis; he has had bilateral nephrectomies in the past. 4) History of chronic headaches. 5) Borderline diabetes. 6) Sleep apnea. 7) History of stroke with no residual deficit. 8) Arthritis with knee pain.  Juanita Craver 08/01/2019, 11:58 AM

## 2019-08-01 NOTE — Progress Notes (Signed)
Pharmacy Antibiotic Note  Carl Yamashiro Sr. is a 59 y.o. male admitted on 07/31/2019 with intra-abdominal infection.  Pharmacy has been consulted for Zosyn dosing. WBC WNL. ESRD on HD.   Plan: Zosyn 2.25 IV over 30 minutes every 8 hours  F/u clinical status, infectious work-up  Antimicrobials this admission:  zosyn 6/19 >>   Dose adjustments this admission:  Zosyn 3.375 > 2.25 for HD dosing   Microbiology results:  none   Height: 6\' 1"  (185.4 cm) Weight: 91.2 kg (201 lb) IBW/kg (Calculated) : 79.9  Temp (24hrs), Avg:98.4 F (36.9 C), Min:98.1 F (36.7 C), Max:98.7 F (37.1 C)  Recent Labs  Lab 07/31/19 1932 08/01/19 0304  WBC 6.9  --   CREATININE 6.32* 7.04*    Estimated Creatinine Clearance: 12.6 mL/min (A) (by C-G formula based on SCr of 7.04 mg/dL (H)).    Allergies  Allergen Reactions  . Infed [Iron Dextran] Other (See Comments)    Decreased BP   . Oxycodone Nausea Only  . Vicodin [Hydrocodone-Acetaminophen] Other (See Comments)    Decreased BP  . Whole Blood Other (See Comments)    Patient refuses whole blood "unless I am going to die"  . Diclofenac Other (See Comments)    Unknown   Thank you,   Eddie Candle, PharmD PGY-1 Pharmacy Resident   Please check amion for clinical pharmacist contact number

## 2019-08-01 NOTE — Progress Notes (Signed)
Patient arrived to the floor via the stretcher. Pt alert and oriented in no distress. Cardiac monitoring initiated. SBP in the 80s. Pt denies pain. Pt stops at the bathroom and report a bloody stool. We asked pt to let us see the stool if he happen to have another episode. We'll continue to monitor.

## 2019-08-01 NOTE — Progress Notes (Signed)
Pharmacy Antibiotic Note  Carl Loving Sr. is a 59 y.o. male admitted on 07/31/2019 with intra-abdominal infection.  Pharmacy has been consulted for Zosyn dosing. WBC WNL. ESRD on HD.   Plan: Zosyn 3.375G IV q12h to be infused over 4 hours Trend WBC, temp, HD schedule F/U infectious work-up   Height: 6\' 1"  (185.4 cm) Weight: 92 kg (202 lb 13.2 oz) IBW/kg (Calculated) : 79.9  Temp (24hrs), Avg:98.1 F (36.7 C), Min:98.1 F (36.7 C), Max:98.1 F (36.7 C)  Recent Labs  Lab 07/31/19 1932  WBC 6.9  CREATININE 6.32*    Estimated Creatinine Clearance: 14 mL/min (A) (by C-G formula based on SCr of 6.32 mg/dL (H)).    Allergies  Allergen Reactions  . Infed [Iron Dextran] Other (See Comments)    Decreased BP   . Oxycodone Nausea Only  . Vicodin [Hydrocodone-Acetaminophen] Other (See Comments)    Decreased BP  . Whole Blood Other (See Comments)    Patient refuses whole blood "unless I am going to die"  . Diclofenac Other (See Comments)    Unknown   Narda Bonds, PharmD, [place] Clinical Pharmacist Phone: [phone]

## 2019-08-01 NOTE — Progress Notes (Signed)
I was called last night, and asked to see this patient who has recent melena.  Review of the record indicates he was scoped as an outpatient by Dr. Alben Spittle of [facility] in 2012, so by current GI community guidelines, he would not be considered an unassigned patient. More recently, in 2017, he was seen as an inpatient by the [facility] group for a lower GI bleed.  I have paged the physician covering for [facility] this weekend and will ask them to see the patient.  Cleotis Nipper, M.D. Pager [phone] If no answer or after 5 PM call [phone]

## 2019-08-01 NOTE — Progress Notes (Signed)
PROGRESS NOTE    Carl Bigley Sr.  IRW:[id number] DOB: 30-Mar-1960 DOA: 07/31/2019 PCP: [name], MD    Brief Narrative:  59 y.o. male with medical history significant for ESRD on hemodialysis, history of CVA, and now presenting to emergency department for evaluation of dark tarry stools and low blood pressure.  Patient reports that he completed dialysis today, went on to have approximately 5 episodes of dark tarry stool, has had some abdominal cramping and discomfort, saw his PCP for evaluation of this, was found to be hypotensive and directed to the ED.  Patient denies any nausea or vomiting.  He denies fevers or chills.  He reports occasional ibuprofen use, last used 2 or 3 days ago.  He takes aspirin 81 mg every other day and uses omeprazole on an as-needed basis.  He reports that his blood pressure usually runs in the low one hundreds systolic.  He denies feeling lightheaded and denies any chest pain.  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  ED Course: Upon arrival to the ED, patient is found to be afebrile, saturating well on room air, blood pressure 85/56, and heart rate 102.  EKG features a sinus rhythm.  CT the abdomen pelvis notable for diffuse diverticular disease with subtle inflammatory changes at the rectosigmoid colon which could reflect mild acute diverticulitis.  Chemistry panel features a normal potassium, normal bicarbonate, and BUN 55.  CBC notable for hemoglobin of 10.8, down from 11.5 in April 2021.  Type and screen was performed in the ED, 1 L of saline was administered, and the patient was given a dose of Zosyn.  COVID-19 screening test not yet resulted.  Patient refused rectal exam.   Assessment & Plan:   Principal Problem:   Acute GI bleeding Active Problems:   ESRD on dialysis ([place])   History of completed stroke   Refusal of blood transfusions as patient is Jehovah's Witness   Seizures ([place])   Hypotension   Diverticulitis large  intestine   1. Acute GI bleeding  - Patient reports several episodes of dark tarry stools prior to admit, was hypotensive when he saw his PCP for this and was directed to the ED  - hgb down to 9.7 today - GI consulted and pt now s/p EGD. Findings of pre-pyloric ulcer with gastritis and gastric polyp noted. Recommendation for clears for today. Continue PPI -Repeat CBC in AM  2. Hypotension  - BP improved with IVF hydration, currently stable  3. Diverticulitis   - Presents with melena and is found to have subtle inflammatory changes around a rectosigmoid diverticula that could reflect acute diverticulitis without perforation or abscess  - He was started on Zosyn in ED, stable at this time. Pt denies abd pain  4. ESRD  - Completed HD on 07/30/19  - No indication for urgent HD on admission but received a liter NS bolus in ED -Appears euvolemic at this time. Cont to monitor - Undergoes HD on MWF, if pt needing to remain inpt on Monday, would consult Nephrology  5. History of CVA; seizures  - Patient reports no recent seizures and no longer takes antiepileptics  - Cont to hold ASA in light of GIB. No NSAIDS per GI recs    DVT prophylaxis: SCD's Code Status: Full Family Communication: Pt in room, family not at bedside  Status is: Inpatient  Remains inpatient appropriate because:Ongoing diagnostic testing needed not appropriate for outpatient work up   Dispo: The patient is from:  Home              Anticipated d/c is to: Home              Anticipated d/c date is: 1 day              Patient currently is not medically stable to d/c.       Consultants:   GI  Procedures:   EGD 6/19  Antimicrobials: Anti-infectives (From admission, onward)   Start     Dose/Rate Route Frequency Ordered Stop   08/01/19 1400  piperacillin-tazobactam (ZOSYN) IVPB 2.25 g     Discontinue     2.25 g 100 mL/hr over 30 Minutes Intravenous Every 8 hours 08/01/19 1117     08/01/19 0600   piperacillin-tazobactam (ZOSYN) IVPB 3.375 g  Status:  Discontinued        3.375 g 12.5 mL/hr over 240 Minutes Intravenous Every 8 hours 08/01/19 0017 08/01/19 1117   07/31/19 2315  piperacillin-tazobactam (ZOSYN) IVPB 3.375 g        3.375 g 100 mL/hr over 30 Minutes Intravenous  Once 07/31/19 2307 08/01/19 0053       Subjective: Seen this AM prior to EGD. Was eager to have food  Objective: Vitals:   08/01/19 1330 08/01/19 1345 08/01/19 1400 08/01/19 1445  BP: (!) 95/50 95/64 93/63  (!) 96/46  Pulse: 83 82 82 71  Resp: 16 18 16 14   Temp:      TempSrc:      SpO2: 100% 99% 100% 100%  Weight:      Height:        Intake/Output Summary (Last 24 hours) at 08/01/2019 1616 Last data filed at 08/01/2019 1506 Gross per 24 hour  Intake 1280 ml  Output -  Net 1280 ml   Filed Weights   07/31/19 1916 07/31/19 1924 08/01/19 0258  Weight: 91.6 kg 92 kg 91.2 kg    Examination:  General exam: Appears calm and comfortable  Respiratory system: Clear to auscultation. Respiratory effort normal. Cardiovascular system: S1 & S2 heard, Regular Gastrointestinal system: Abdomen is nondistended, soft and nontender. No organomegaly or masses felt. Normal bowel sounds heard. Central nervous system: Alert and oriented. No focal neurological deficits. Extremities: Symmetric 5 x 5 power. Skin: No rashes, lesions Psychiatry: Judgement and insight appear normal. Mood & affect appropriate.   Data Reviewed: I have personally reviewed following labs and imaging studies  CBC: Recent Labs  Lab 07/31/19 1932 08/01/19 0304  WBC 6.9  --   HGB 10.8* 9.7*  HCT 35.6* 30.6*  MCV 93.0  --   PLT 251  --    Basic Metabolic Panel: Recent Labs  Lab 07/31/19 1932 08/01/19 0304  NA 138 136  K 4.7 4.7  CL 96* 97*  CO2 26 25  GLUCOSE 91 78  BUN 55* 72*  CREATININE 6.32* 7.04*  CALCIUM 9.6 9.2   GFR: Estimated Creatinine Clearance: 12.6 mL/min (A) (by C-G formula based on SCr of 7.04 mg/dL (H)).  Liver Function Tests: Recent Labs  Lab 07/31/19 1932  AST 19  ALT 15  ALKPHOS 60  BILITOT 0.5  PROT 8.2*  ALBUMIN 3.2*   Recent Labs  Lab 07/31/19 1932  LIPASE 70*   No results for input(s): AMMONIA in the last 168 hours. Coagulation Profile: No results for input(s): INR, PROTIME in the last 168 hours. Cardiac Enzymes: No results for input(s): CKTOTAL, CKMB, CKMBINDEX, TROPONINI in the last 168 hours. BNP (last 3 results) No results  for input(s): PROBNP in the last 8760 hours. HbA1C: No results for input(s): HGBA1C in the last 72 hours. CBG: No results for input(s): GLUCAP in the last 168 hours. Lipid Profile: No results for input(s): CHOL, HDL, LDLCALC, TRIG, CHOLHDL, LDLDIRECT in the last 72 hours. Thyroid Function Tests: No results for input(s): TSH, T4TOTAL, FREET4, T3FREE, THYROIDAB in the last 72 hours. Anemia Panel: No results for input(s): VITAMINB12, FOLATE, FERRITIN, TIBC, IRON, RETICCTPCT in the last 72 hours. Sepsis Labs: No results for input(s): PROCALCITON, LATICACIDVEN in the last 168 hours.  Recent Results (from the past 240 hour(s))  SARS Coronavirus 2 by RT PCR (hospital order, performed in [facility] hospital lab) Nasopharyngeal Nasopharyngeal Swab     Status: None   Collection Time: 08/01/19 12:01 AM   Specimen: Nasopharyngeal Swab  Result Value Ref Range Status   SARS Coronavirus 2 NEGATIVE NEGATIVE Final    Comment: (NOTE) SARS-CoV-2 target nucleic acids are NOT DETECTED.  The SARS-CoV-2 RNA is generally detectable in upper and lower respiratory specimens during the acute phase of infection. The lowest concentration of SARS-CoV-2 viral copies this assay can detect is 250 copies / mL. A negative result does not preclude SARS-CoV-2 infection and should not be used as the sole basis for treatment or other patient management decisions.  A negative result may occur with improper specimen collection / handling, submission of specimen other than  nasopharyngeal swab, presence of viral mutation(s) within the areas targeted by this assay, and inadequate number of viral copies (<250 copies / mL). A negative result must be combined with clinical observations, patient history, and epidemiological information.  Fact Sheet for Patients:   StrictlyIdeas.no  Fact Sheet for Healthcare Providers: BankingDealers.co.za  This test is not yet approved or  cleared by the [place] FDA and has been authorized for detection and/or diagnosis of SARS-CoV-2 by FDA under an Emergency Use Authorization (EUA).  This EUA will remain in effect (meaning this test can be used) for the duration of the COVID-19 declaration under Section 564(b)(1) of the Act, 21 U.S.C. section 360bbb-3(b)(1), unless the authorization is terminated or revoked sooner.  Performed at [facility], [place] [address], [place], [place] [address]      Radiology Studies: CT Abdomen Pelvis W Contrast  Result Date: 07/31/2019 CLINICAL DATA:  Abdomen pain EXAM: CT ABDOMEN AND PELVIS WITH CONTRAST TECHNIQUE: Multidetector CT imaging of the abdomen and pelvis was performed using the standard protocol following bolus administration of intravenous contrast. CONTRAST:  155mL OMNIPAQUE IOHEXOL 300 MG/ML  SOLN COMPARISON:  01/10/2017 FINDINGS: Lower chest: Lung bases demonstrate no acute consolidation or effusion. Hepatobiliary: Gallbladder is dilated. No calcified stone. No biliary dilatation Pancreas: Unremarkable. No pancreatic ductal dilatation or surrounding inflammatory changes. Spleen: Normal in size without focal abnormality. Adrenals/Urinary Tract: Adrenal glands are normal. Interval bilateral nephrectomy. The bladder is empty Stomach/Bowel: The stomach is nonenlarged. No dilated small bowel. Status post right hemicolectomy with ileocolic anastomosis in the right mid abdomen. Diffuse diverticular disease of the colon. Minimal  inflammatory change at the rectosigmoid colon best seen on coronal views, series 6, image number 78, likely due to acute diverticulitis. Vascular/Lymphatic: Mild to moderate aortic atherosclerosis. No aneurysm. No suspicious adenopathy Reproductive: Prostate is unremarkable. Other: Negative for free air or free fluid. Small fat containing supraumbilical ventral hernia Musculoskeletal: Mild diffuse sclerosis likely due to chronic kidney disease. Advanced degenerative changes of both hips. Large lucent lesions/probable geodes within the left acetabulum and proximal right femur. IMPRESSION: 1. Diffuse  diverticular disease of the colon. Subtle inflammatory changes at the rectosigmoid colon, likely due to mild acute diverticulitis. No perforation or abscess. 2. Interval bilateral nephrectomy 3. Distended gallbladder without calcified stone or surrounding inflammation Electronically Signed   By: Donavan Foil M.D.   On: 07/31/2019 22:46    Scheduled Meds: . sodium chloride   Intravenous Once  . latanoprost  1 drop Both Eyes QHS  . [START ON 08/04/2019] pantoprazole  40 mg Intravenous Q12H  . sodium chloride flush  3 mL Intravenous Q12H  . sodium chloride flush  3 mL Intravenous Q12H   Continuous Infusions: . sodium chloride    . pantoprozole (PROTONIX) infusion 8 mg/hr (08/01/19 1203)  . piperacillin-tazobactam (ZOSYN)  IV       LOS: 1 day   Marylu Lund, MD [facility] Pager On Amion  If 7PM-7AM, please contact night-coverage 08/01/2019, 4:16 PM

## 2019-08-02 LAB — CBC
HCT: 27 % — ABNORMAL LOW (ref 39.0–52.0)
Hemoglobin: 8.5 g/dL — ABNORMAL LOW (ref 13.0–17.0)
MCH: 28.2 pg (ref 26.0–34.0)
MCHC: 31.5 g/dL (ref 30.0–36.0)
MCV: 89.7 fL (ref 80.0–100.0)
Platelets: 218 10*3/uL (ref 150–400)
RBC: 3.01 MIL/uL — ABNORMAL LOW (ref 4.22–5.81)
RDW: 16.6 % — ABNORMAL HIGH (ref 11.5–15.5)
WBC: 6.1 10*3/uL (ref 4.0–10.5)
nRBC: 0 % (ref 0.0–0.2)

## 2019-08-02 LAB — BASIC METABOLIC PANEL
Anion gap: 15 (ref 5–15)
BUN: 92 mg/dL — ABNORMAL HIGH (ref 6–20)
CO2: 22 mmol/L (ref 22–32)
Calcium: 9.6 mg/dL (ref 8.9–10.3)
Chloride: 96 mmol/L — ABNORMAL LOW (ref 98–111)
Creatinine, Ser: 9.82 mg/dL — ABNORMAL HIGH (ref 0.61–1.24)
GFR calc Af Amer: 6 mL/min — ABNORMAL LOW (ref >60)
GFR calc non Af Amer: 5 mL/min — ABNORMAL LOW (ref >60)
Glucose, Bld: 77 mg/dL (ref 70–99)
Potassium: 6.4 mmol/L (ref 3.5–5.1)
Sodium: 133 mmol/L — ABNORMAL LOW (ref 135–145)

## 2019-08-02 LAB — POTASSIUM: Potassium: 5.1 mmol/L (ref 3.5–5.1)

## 2019-08-02 MED ORDER — SODIUM ZIRCONIUM CYCLOSILICATE 10 G PO PACK
10.0000 g | PACK | Freq: Three times a day (TID) | ORAL | Status: DC
  Administered 2019-08-02 (×3): 10 g via ORAL
  Filled 2019-08-02 (×3): qty 1

## 2019-08-02 MED ORDER — DARBEPOETIN ALFA 100 MCG/0.5ML IJ SOSY
100.0000 ug | PREFILLED_SYRINGE | INTRAMUSCULAR | Status: DC
  Administered 2019-08-03: 100 ug via INTRAVENOUS
  Filled 2019-08-02: qty 0.5

## 2019-08-02 MED ORDER — SEVELAMER CARBONATE 800 MG PO TABS
1600.0000 mg | ORAL_TABLET | Freq: Three times a day (TID) | ORAL | Status: DC
  Administered 2019-08-02 – 2019-08-05 (×5): 1600 mg via ORAL
  Filled 2019-08-02 (×5): qty 2

## 2019-08-02 MED ORDER — CINACALCET HCL 30 MG PO TABS
90.0000 mg | ORAL_TABLET | Freq: Every day | ORAL | Status: DC
  Administered 2019-08-02 – 2019-08-04 (×3): 90 mg via ORAL
  Filled 2019-08-02 (×3): qty 3

## 2019-08-02 MED ORDER — GABAPENTIN 100 MG PO CAPS
100.0000 mg | ORAL_CAPSULE | Freq: Two times a day (BID) | ORAL | Status: DC
  Administered 2019-08-02 – 2019-08-05 (×7): 100 mg via ORAL
  Filled 2019-08-02 (×7): qty 1

## 2019-08-02 NOTE — Progress Notes (Signed)
CRITICAL VALUE ALERT  Critical Value:  Potassium 6.4  Date & Time Notied:  08/02/19 2787  Provider Notified: Wyline Copas, MD  Orders Received/Actions taken: lokelma ordered

## 2019-08-02 NOTE — Consult Note (Addendum)
[facility] Renal Consultation Note    Indication for Consultation:  Management of ESRD/hemodialysis; anemia, hypertension/volume and secondary hyperparathyroidism  HPI: Carl Giraud Sr. is a 59 y.o. male with ESRD on HD MWF at [name]. PMH also sig for HTN, prior CVA, diverticulitis He is admitted with GI bleeding/melena. Had several episodes of melenotic stools prior to admission. Hypotensive on arrival and received 1L IVF bolus. GI was consulted and underwent EGD with gastritis and gastric polyp noted.  Diffuse diverticular disease on CT Abdomen. Hgb trending down. 10.8 > 9.7>8.5. 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 Labs today also notable for potassium 6.4. He was last dialyzed Friday 6/18 and completed a full treatment. Seen and examined at bedside. He feels better, does report some blood with BM today but says is "dissipating" Denies CP, SOB, N/V,D Abd pain.   Past Medical History:  Diagnosis Date  . Anemia   . Arthritis   . Borderline diabetes   . Chronic headaches   . Diverticulitis   . ESRD (end stage renal disease) ([place])     On Renal Transplant List," [facility]; MWF" (10/23/2016)  . GI bleed   . Hypertension   . Parathyroid abnormality ([facility])    ectopic parathyroid gland  . Presence of arteriovenous fistula for hemodialysis, primary ([place])    RUE  . Refusal of blood product    NO WHOLE BLOOD PROUCTS  . Secondary hyperparathyroidism ([place])   . Seizures ([place])    one episode in past  . Sleep apnea    doesn't use CPAP anymore since weight loss  . Stroke [facility])    no residual   Past Surgical History:  Procedure Laterality Date  . AV FISTULA PLACEMENT Right    right arm  . CATARACT EXTRACTION W/ INTRAOCULAR LENS  IMPLANT, BILATERAL    . COLON SURGERY    . COLONOSCOPY N/A 08/04/2015   Procedure: COLONOSCOPY;  Surgeon: Jerene Bears, MD;  Location: [facility] ENDOSCOPY;  Service: Endoscopy;   Laterality: N/A;  . graft left arm Left    for dialysis x 2  . INSERTION OF DIALYSIS CATHETER     Rt chest  . LAPAROSCOPIC RIGHT COLECTOMY N/A 08/05/2015   Procedure: LAPAROSCOPIC RIGHT COLECTOMY- ASCENDING;  Surgeon: Stark Klein, MD;  Location: [place];  Service: General;  Laterality: N/A;  . MASS EXCISION Left 05/28/2019   Procedure: EXCISION SOFT TISSUE MASS LEFT SHOULDER;  Surgeon: Armandina Gemma, MD;  Location: [facility];  Service: General;  Laterality: Left;  . PARATHYROIDECTOMY N/A 06/12/2016   Procedure: TOTAL PARATHYROIDECTOMY WITH AUTOTRANSPLANTATION TO LEFT FOREARM;  Surgeon: Armandina Gemma, MD;  Location: [place];  Service: General;  Laterality: N/A;  . PARATHYROIDECTOMY  10/23/2016  . PARATHYROIDECTOMY N/A 10/23/2016   Procedure: PARATHYROIDECTOMY;  Surgeon: Armandina Gemma, MD;  Location: [place];  Service: General;  Laterality: N/A;  . REVISON OF ARTERIOVENOUS FISTULA Right 07/16/2017   Procedure: REVISION OF ARTERIOVENOUS FISTULA  Right ARM;  Surgeon: Waynetta Sandy, MD;  Location: [place];  Service: Vascular;  Laterality: Right;   Family History  Problem Relation Age of Onset  . Diabetes Father   . Stroke Father   . Hypertension Father   . Uterine cancer Mother   . Lupus Sister   . Stroke Sister   . Hypertension Sister   . Anuerysm Brother        brain   Social History:  reports that he quit smoking about  29 years ago. He has never used smokeless tobacco. He reports current alcohol use of about 1.0 standard drink of alcohol per week. He reports that he does not use drugs. Allergies  Allergen Reactions  . Infed [Iron Dextran] Other (See Comments)    Decreased BP   . Oxycodone Nausea Only  . Vicodin [Hydrocodone-Acetaminophen] Other (See Comments)    Decreased BP  . Whole Blood Other (See Comments)    Patient refuses whole blood "unless I am going to die"  . Diclofenac Other (See Comments)    Unknown   Prior to Admission medications   Medication Sig Start Date End Date  Taking? Authorizing Provider  aspirin EC 81 MG tablet Take 1 tablet (81 mg total) by mouth daily. Patient taking differently: Take 81 mg by mouth every other day.  08/26/14  Yes Rosalin Hawking, MD  atorvastatin (LIPITOR) 10 MG tablet Take 1 tablet (10 mg total) by mouth daily at 6 PM. 05/25/14  Yes Thurnell Lose, MD  bimatoprost (LUMIGAN) 0.03 % ophthalmic solution Place 1 drop into both eyes daily.   Yes [provider]  cholecalciferol (VITAMIN D3) 25 MCG (1000 UNIT) tablet Take 1,000 Units by mouth daily.   Yes [provider]  cinacalcet (SENSIPAR) 90 MG tablet Take 90 mg by mouth daily.  05/20/19  Yes [provider]  gabapentin (NEURONTIN) 100 MG capsule Take 100 mg by mouth 2 (two) times daily.  09/29/14  Yes [provider]  ibuprofen (ADVIL,MOTRIN) 800 MG tablet Take 800 mg by mouth every 8 (eight) hours as needed for mild pain or moderate pain.  10/14/15  Yes [provider]  LOKELMA 10 g PACK packet Take 1 packet by mouth daily. 03/11/19  Yes [provider]  multivitamin (RENA-VIT) TABS tablet Take 1 tablet by mouth daily.   Yes [provider]  omeprazole (PRILOSEC) 20 MG capsule Take 20 mg by mouth 2 (two) times daily as needed (heartburn).    Yes [provider]  sevelamer carbonate (RENVELA) 800 MG tablet Take 1,600 mg by mouth 3 (three) times daily with meals.    Yes [provider]  TURMERIC PO Take 1 tablet by mouth daily.   Yes [provider]  vitamin E 180 MG (400 UNITS) capsule Take 400 Units by mouth daily.   Yes [provider]  topiramate (TOPAMAX) 25 MG tablet Take 1 tablet (25 mg total) by mouth 2 (two) times daily. On HD days, take morning dose after HD treatment Patient not taking: Reported on 05/15/2019 11/11/18   Frann Rider, NP  traMADol (ULTRAM) 50 MG tablet Take 1-2 tablets (50-100 mg total) by mouth every 6 (six) hours as needed. Patient not taking: Reported on 07/31/2019  05/28/19   Armandina Gemma, MD   Current Facility-Administered Medications  Medication Dose Route Frequency Provider Last Rate Last Admin  . 0.9 %  sodium chloride infusion (Manually program via Guardrails IV Fluids)   Intravenous Once Opyd, Jekhi S, MD      . 0.9 %  sodium chloride infusion  250 mL Intravenous PRN Opyd, Ilene Qua, MD      . acetaminophen (TYLENOL) tablet 650 mg  650 mg Oral Q6H PRN Opyd, Ilene Qua, MD       Or  . acetaminophen (TYLENOL) suppository 650 mg  650 mg Rectal Q6H PRN Opyd, Ilene Qua, MD      . latanoprost (XALATAN) 0.005 % ophthalmic solution 1 drop  1 drop Both Eyes QHS Opyd,  Ilene Qua, MD   1 drop at 08/01/19 2224  . ondansetron (ZOFRAN) tablet 4 mg  4 mg Oral Q6H PRN Opyd, Ilene Qua, MD       Or  . ondansetron (ZOFRAN) injection 4 mg  4 mg Intravenous Q6H PRN Opyd, Ilene Qua, MD      . pantoprazole (PROTONIX) 80 mg in sodium chloride 0.9 % 100 mL (0.8 mg/mL) infusion  8 mg/hr Intravenous Continuous Opyd, Ilene Qua, MD 10 mL/hr at 08/02/19 0659 8 mg/hr at 08/02/19 0659  . [START ON 08/04/2019] pantoprazole (PROTONIX) injection 40 mg  40 mg Intravenous Q12H Opyd, Ilene Qua, MD      . piperacillin-tazobactam (ZOSYN) IVPB 2.25 g  2.25 g Intravenous Q8H Donne Hazel, MD 100 mL/hr at 08/02/19 0456 2.25 g at 08/02/19 0456  . sodium chloride flush (NS) 0.9 % injection 3 mL  3 mL Intravenous Q12H Opyd, Ilene Qua, MD   3 mL at 08/01/19 2142  . sodium chloride flush (NS) 0.9 % injection 3 mL  3 mL Intravenous Q12H Opyd, Carly S, MD      . sodium chloride flush (NS) 0.9 % injection 3 mL  3 mL Intravenous PRN Opyd, Ilene Qua, MD      . sodium zirconium cyclosilicate (LOKELMA) packet 10 g  10 g Oral TID Donne Hazel, MD   10 g at 08/02/19 0934     ROS: As per HPI otherwise negative.  Physical Exam: Vitals:   08/01/19 1952 08/01/19 2302 08/02/19 0336 08/02/19 0854  BP: 132/68 93/64 (!) 104/54 98/60  Pulse: 72 84 86 75  Resp: 13 15 11 18   Temp: 97.6 F (36.4 C)  97.8 F (36.6 C) 98.1 F (36.7 C) 98.2 F (36.8 C)  TempSrc: Oral Oral Oral Oral  SpO2: 100% 99% 98% 96%  Weight:   92.6 kg   Height:         General: Well appearing, nad  Head: NCAT sclera not icteric Neck: Supple. No JVD No masses Lungs: CTA bilaterally without wheezes, rales, or rhonchi.  Heart: RRR with S1 S2 Abdomen: BS+ soft non-tender, non-distended  Lower extremities:without edema or ischemic changes, no open wounds  Neuro: A & O  X 3. Moves all extremities spontaneously. Psych:  Responds to questions appropriately with a normal affect. Dialysis Access: R AVF +bruit   Labs: Basic Metabolic Panel: Recent Labs  Lab 07/31/19 1932 08/01/19 0304 08/02/19 0726  NA 138 136 133*  K 4.7 4.7 6.4*  CL 96* 97* 96*  CO2 26 25 22   GLUCOSE 91 78 77  BUN 55* 72* 92*  CREATININE 6.32* 7.04* 9.82*  CALCIUM 9.6 9.2 9.6   Liver Function Tests: Recent Labs  Lab 07/31/19 1932  AST 19  ALT 15  ALKPHOS 60  BILITOT 0.5  PROT 8.2*  ALBUMIN 3.2*   Recent Labs  Lab 07/31/19 1932  LIPASE 70*   No results for input(s): AMMONIA in the last 168 hours. CBC: Recent Labs  Lab 07/31/19 1932 08/01/19 0304 08/02/19 0726  WBC 6.9  --  6.1  HGB 10.8* 9.7* 8.5*  HCT 35.6* 30.6* 27.0*  MCV 93.0  --  89.7  PLT 251  --  218   Cardiac Enzymes: No results for input(s): CKTOTAL, CKMB, CKMBINDEX, TROPONINI in the last 168 hours. CBG: No results for input(s): GLUCAP in the last 168 hours. Iron Studies: No results for input(s): IRON, TIBC, TRANSFERRIN, FERRITIN in the last 72 hours. Studies/Results: CT Abdomen Pelvis W  Contrast  Result Date: 07/31/2019 CLINICAL DATA:  Abdomen pain EXAM: CT ABDOMEN AND PELVIS WITH CONTRAST TECHNIQUE: Multidetector CT imaging of the abdomen and pelvis was performed using the standard protocol following bolus administration of intravenous contrast. CONTRAST:  160mL OMNIPAQUE IOHEXOL 300 MG/ML  SOLN COMPARISON:  01/10/2017 FINDINGS: Lower chest: Lung bases  demonstrate no acute consolidation or effusion. Hepatobiliary: Gallbladder is dilated. No calcified stone. No biliary dilatation Pancreas: Unremarkable. No pancreatic ductal dilatation or surrounding inflammatory changes. Spleen: Normal in size without focal abnormality. Adrenals/Urinary Tract: Adrenal glands are normal. Interval bilateral nephrectomy. The bladder is empty Stomach/Bowel: The stomach is nonenlarged. No dilated small bowel. Status post right hemicolectomy with ileocolic anastomosis in the right mid abdomen. Diffuse diverticular disease of the colon. Minimal inflammatory change at the rectosigmoid colon best seen on coronal views, series 6, image number 78, likely due to acute diverticulitis. Vascular/Lymphatic: Mild to moderate aortic atherosclerosis. No aneurysm. No suspicious adenopathy Reproductive: Prostate is unremarkable. Other: Negative for free air or free fluid. Small fat containing supraumbilical ventral hernia Musculoskeletal: Mild diffuse sclerosis likely due to chronic kidney disease. Advanced degenerative changes of both hips. Large lucent lesions/probable geodes within the left acetabulum and proximal right femur. IMPRESSION: 1. Diffuse diverticular disease of the colon. Subtle inflammatory changes at the rectosigmoid colon, likely due to mild acute diverticulitis. No perforation or abscess. 2. Interval bilateral nephrectomy 3. Distended gallbladder without calcified stone or surrounding inflammation Electronically Signed   By: Donavan Foil M.D.   On: 07/31/2019 22:46    Dialysis Orders:  [facility] MWF 4h 67min 450/800 EDW 91kg 2K/2Ca AVF Hep 3000 Hectorol 10 Sensipar 90 Fosrenol 1000 TID + Renvela 6 tid  Mircera 75 q 4 weeks (last dose 50 on 6/9)   Assessment/Plan: 1. GI Bleed/Melena. GI consulted s/p EGD with gastritis and gastric polyp. Per GI/primary  2. Hyperkalemia. K+ 6.4 Chronic issue. Lokelma 10 daily on outpatient med list but has been out of meds.  Treating with Lokelma  today.  Trend potassium. May need dialysis tonight if not improved.  3. ESRD -  HD MWF. Last dialyzed Friday 6/18. Orders written for HD Monday am on schedule.  4. Hypertension/volume  - Hypotensive s/p 1L IVF bolus. No gross overload on exam. Minimal UF on HD  5. Anemia  - Hgb trending down. 8.5 today.  Redose ESA with HD 2/48.  6. Metabolic bone disease -   Continue Sensipar/binders when eating  7. Nutrition - Renal diet/vitamins when eating.   Lynnda Child PA-C [facility] Pager [phone] 08/02/2019, 12:00 PM

## 2019-08-02 NOTE — Progress Notes (Signed)
CROSS COVER [facility]OLOGY ROUNDING NOTE Subjective: [name] is a 59 year old black male with a history of end-stage renal disease on dialysis, history of CVA who presented to the emergency room with dark tarry stools on 07/31/2019.  He was hypotensive in the ER and was aggressively resuscitated and endoscopy was done yesterday revealed a clean-based ulcer in the antrum with some edematous mucosa surrounding the ulcer base.  Antral biopsies were done to rule out H. pylori by pathology. The patient is a Sales promotion account executive Witness and does not want any blood transfusions or blood products given to him.  He seems to be doing well today.  He has not had any further melenic stools and the color of his stools seem to be "clearing up".  He has had no further abdominal pain nausea vomiting. He is tolerating clears well for now.  His hemoglobin was 8.5 g/dL down from 10.8 g/dL on admission.  Objective: Vital signs in last 24 hours: Temp:  [97.6 F (36.4 C)-98.2 F (36.8 C)] 98.2 F (36.8 C) (06/20 0854) Pulse Rate:  [69-86] 75 (06/20 0854) Resp:  [11-18] 18 (06/20 0854) BP: (93-132)/(40-78) 98/60 (06/20 0854) SpO2:  [96 %-100 %] 96 % (06/20 0854) Weight:  [92.6 kg] 92.6 kg (06/20 0336) Last BM Date: 08/01/19 General: NAD, alert oriented x3 Lungs: Clear to auscultation with no rales, rhonchi or wheezing Heart: S1-S2 regular  Abdomen: Soft nontender with normal bowel sounds.  Surgical scars and scars noted from previous surgeries below the umbilicus in the midline. Ext: No edema cyanosis or clubbing.  Intake/Output from previous day: 06/19 0701 - 06/20 0700 In: 627.2 [P.O.:240; I.V.:263.1; IV Piggyback:124.1] Out: -  Intake/Output this shift: No intake/output data recorded.   Lab Results: Recent Labs    07/31/19 1932 08/01/19 0304 08/02/19 0726  WBC 6.9  --  6.1  HGB 10.8* 9.7* 8.5*  PLT 251  --  218  MCV 93.0  --  89.7   BMET Recent Labs    07/31/19 1932 08/01/19 0304  08/02/19 0726  NA 138 136 133*  K 4.7 4.7 6.4*  CL 96* 97* 96*  CO2 26 25 22   GLUCOSE 91 78 77  BUN 55* 72* 92*  CREATININE 6.32* 7.04* 9.82*  CALCIUM 9.6 9.2 9.6   LFT Recent Labs    07/31/19 1932  PROT 8.2*  ALBUMIN 3.2*  AST 19  ALT 15  ALKPHOS 60  BILITOT 0.5   Imaging/Other results: CT Abdomen Pelvis W Contrast  Result Date: 07/31/2019 CLINICAL DATA:  Abdomen pain EXAM: CT ABDOMEN AND PELVIS WITH CONTRAST TECHNIQUE: Multidetector CT imaging of the abdomen and pelvis was performed using the standard protocol following bolus administration of intravenous contrast. CONTRAST:  139mL OMNIPAQUE IOHEXOL 300 MG/ML  SOLN COMPARISON:  01/10/2017 FINDINGS: Lower chest: Lung bases demonstrate no acute consolidation or effusion. Hepatobiliary: Gallbladder is dilated. No calcified stone. No biliary dilatation Pancreas: Unremarkable. No pancreatic ductal dilatation or surrounding inflammatory changes. Spleen: Normal in size without focal abnormality. Adrenals/Urinary Tract: Adrenal glands are normal. Interval bilateral nephrectomy. The bladder is empty Stomach/Bowel: The stomach is nonenlarged. No dilated small bowel. Status post right hemicolectomy with ileocolic anastomosis in the right mid abdomen. Diffuse diverticular disease of the colon. Minimal inflammatory change at the rectosigmoid colon best seen on coronal views, series 6, image number 11, likely due to acute diverticulitis. Vascular/Lymphatic: Mild to moderate aortic atherosclerosis. No aneurysm. No suspicious adenopathy Reproductive: Prostate is unremarkable. Other: Negative for free air or free fluid. Small fat  containing supraumbilical ventral hernia Musculoskeletal: Mild diffuse sclerosis likely due to chronic kidney disease. Advanced degenerative changes of both hips. Large lucent lesions/probable geodes within the left acetabulum and proximal right femur. IMPRESSION: 1. Diffuse diverticular disease of the colon. Subtle inflammatory  changes at the rectosigmoid colon, likely due to mild acute diverticulitis. No perforation or abscess. 2. Interval bilateral nephrectomy 3. Distended gallbladder without calcified stone or surrounding inflammation Electronically Signed   By: Donavan Foil M.D.   On: 07/31/2019 22:46   Assessment/Plan  1) Clean-based prepyloric ulcer causing melena and anemia-I plan to advance the patient's diet today.  We will allow him a renal diet and monitor his CBCs closely.  He has been advised to see Dr. 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  Dr. Havery Moros is to resume care tomorrow morning fOR any questions or concerns. 2) Diffuse diverticulosis on CT scan-I do not think the patient has acute diverticulitis clinical exam. 3) ESRD on HD. 4) Arthritis with recent knee pain for which the patient use ibuprofen-patient is strongly been advised against the use of all nonsteroidals which can worsen his baseline condition and cause peptic ulcer disease.  Juanita Craver, MD  08/02/2019, 9:57 AM

## 2019-08-02 NOTE — Progress Notes (Signed)
PROGRESS NOTE    Carl Vi Sr.  UUE:[id number] DOB: Jan 03, 1961 DOA: 07/31/2019 PCP: [name], MD    Brief Narrative:  59 y.o. male with medical history significant for ESRD on hemodialysis, history of CVA, and now presenting to emergency department for evaluation of dark tarry stools and low blood pressure.  Patient reports that he completed dialysis today, went on to have approximately 5 episodes of dark tarry stool, has had some abdominal cramping and discomfort, saw his PCP for evaluation of this, was found to be hypotensive and directed to the ED.  Patient denies any nausea or vomiting.  He denies fevers or chills.  He reports occasional ibuprofen use, last used 2 or 3 days ago.  He takes aspirin 81 mg every other day and uses omeprazole on an as-needed basis.  He reports that his blood pressure usually runs in the low one hundreds systolic.  He denies feeling lightheaded and denies any chest pain.  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  ED Course: Upon arrival to the ED, patient is found to be afebrile, saturating well on room air, blood pressure 85/56, and heart rate 102.  EKG features a sinus rhythm.  CT the abdomen pelvis notable for diffuse diverticular disease with subtle inflammatory changes at the rectosigmoid colon which could reflect mild acute diverticulitis.  Chemistry panel features a normal potassium, normal bicarbonate, and BUN 55.  CBC notable for hemoglobin of 10.8, down from 11.5 in April 2021.  Type and screen was performed in the ED, 1 L of saline was administered, and the patient was given a dose of Zosyn.  COVID-19 screening test not yet resulted.  Patient refused rectal exam.   Assessment & Plan:   Principal Problem:   Acute GI bleeding Active Problems:   ESRD on dialysis ([place])   History of completed stroke   Refusal of blood transfusions as patient is Jehovah's Witness   Seizures ([place])   Hypotension   Diverticulitis large  intestine   1. Acute GI bleeding  - Patient reports several episodes of dark tarry stools prior to admit, was hypotensive when he saw his PCP for this and was directed to the ED  - hgb down to 8.5 today -Pt reports dark stool this AM, but states it appears to be resolving - GI consulted and pt now s/p EGD. Findings of pre-pyloric ulcer with gastritis and gastric polyp noted. Continue PPI -Awaiting GI recs for today, however diet has been advanced to renal diet per GI -Repeat CBC in AM  2. Hypotension  - BP improved with IVF hydration, currently stable at this time  3. Diverticulitis   - Presents with melena and is found to have subtle inflammatory changes around a rectosigmoid diverticula that could reflect acute diverticulitis without perforation or abscess  - He was started on Zosyn in ED -Remains stable without complaints of abd pain. Afebrile and no leukocytosis. Will hold further abx after today's dose  4. ESRD  - Completed HD on 07/30/19  - No indication for urgent HD on admission but received a liter NS bolus in ED -Appears euvolemic at this time. Cont to monitor - have consulted Nephrology. - Potassium up to 6.4 today, see below - Per nephrology, if potassium remains elevated this afternoon despite lokelma, then possible HD today  5. History of CVA; seizures  - Patient reports no recent seizures and no longer takes antiepileptics  - Cont to hold ASA in light of GIB. No  NSAIDS per GI recs   6. Hyperkalemia - in setting of ESRD per above - Potassium up to 6.4 today - Have continued pt on 10g TID of lokelma - Repeat K this afternoon. If remains elevated possible HD today per Nephrology   DVT prophylaxis: SCD's Code Status: Full Family Communication: Pt in room, family not at bedside  Status is: Inpatient  Remains inpatient appropriate because:Persistent severe electrolyte disturbances and Ongoing diagnostic testing needed not appropriate for outpatient work  up   Dispo: The patient is from: Home              Anticipated d/c is to: Home              Anticipated d/c date is: 1 day              Patient currently is not medically stable to d/c.       Consultants:   GI  nephrology  Procedures:   EGD 6/19  Antimicrobials: Anti-infectives (From admission, onward)   Start     Dose/Rate Route Frequency Ordered Stop   08/01/19 1400  piperacillin-tazobactam (ZOSYN) IVPB 2.25 g     Discontinue     2.25 g 100 mL/hr over 30 Minutes Intravenous Every 8 hours 08/01/19 1117     08/01/19 0600  piperacillin-tazobactam (ZOSYN) IVPB 3.375 g  Status:  Discontinued        3.375 g 12.5 mL/hr over 240 Minutes Intravenous Every 8 hours 08/01/19 0017 08/01/19 1117   07/31/19 2315  piperacillin-tazobactam (ZOSYN) IVPB 3.375 g        3.375 g 100 mL/hr over 30 Minutes Intravenous  Once 07/31/19 2307 08/01/19 0053      Subjective: Without complaints this AM. Reports some dark stool but appears improving, per pt. No abd pain  Objective: Vitals:   08/01/19 1952 08/01/19 2302 08/02/19 0336 08/02/19 0854  BP: 132/68 93/64 (!) 104/54 98/60  Pulse: 72 84 86 75  Resp: 13 15 11 18   Temp: 97.6 F (36.4 C) 97.8 F (36.6 C) 98.1 F (36.7 C) 98.2 F (36.8 C)  TempSrc: Oral Oral Oral Oral  SpO2: 100% 99% 98% 96%  Weight:   92.6 kg   Height:        Intake/Output Summary (Last 24 hours) at 08/02/2019 1433 Last data filed at 08/02/2019 7414 Gross per 24 hour  Intake 477.23 ml  Output --  Net 477.23 ml   Filed Weights   07/31/19 1924 08/01/19 0258 08/02/19 0336  Weight: 92 kg 91.2 kg 92.6 kg    Examination: General exam: Awake, laying in bed, in nad Respiratory system: Normal respiratory effort, no wheezing Cardiovascular system: regular rate, s1, s2 Gastrointestinal system: Soft, nondistended, positive BS, nontender Central nervous system: CN2-12 grossly intact, strength intact Extremities: Perfused, no clubbing Skin: Normal skin turgor, no  notable skin lesions seen Psychiatry: Mood normal // no visual hallucinations   Data Reviewed: I have personally reviewed following labs and imaging studies  CBC: Recent Labs  Lab 07/31/19 1932 08/01/19 0304 08/02/19 0726  WBC 6.9  --  6.1  HGB 10.8* 9.7* 8.5*  HCT 35.6* 30.6* 27.0*  MCV 93.0  --  89.7  PLT 251  --  239   Basic Metabolic Panel: Recent Labs  Lab 07/31/19 1932 08/01/19 0304 08/02/19 0726  NA 138 136 133*  K 4.7 4.7 6.4*  CL 96* 97* 96*  CO2 26 25 22   GLUCOSE 91 78 77  BUN 55* 72* 92*  CREATININE  6.32* 7.04* 9.82*  CALCIUM 9.6 9.2 9.6   GFR: Estimated Creatinine Clearance: 9 mL/min (A) (by C-G formula based on SCr of 9.82 mg/dL (H)). Liver Function Tests: Recent Labs  Lab 07/31/19 1932  AST 19  ALT 15  ALKPHOS 60  BILITOT 0.5  PROT 8.2*  ALBUMIN 3.2*   Recent Labs  Lab 07/31/19 1932  LIPASE 70*   No results for input(s): AMMONIA in the last 168 hours. Coagulation Profile: No results for input(s): INR, PROTIME in the last 168 hours. Cardiac Enzymes: No results for input(s): CKTOTAL, CKMB, CKMBINDEX, TROPONINI in the last 168 hours. BNP (last 3 results) No results for input(s): PROBNP in the last 8760 hours. HbA1C: No results for input(s): HGBA1C in the last 72 hours. CBG: No results for input(s): GLUCAP in the last 168 hours. Lipid Profile: No results for input(s): CHOL, HDL, LDLCALC, TRIG, CHOLHDL, LDLDIRECT in the last 72 hours. Thyroid Function Tests: No results for input(s): TSH, T4TOTAL, FREET4, T3FREE, THYROIDAB in the last 72 hours. Anemia Panel: No results for input(s): VITAMINB12, FOLATE, FERRITIN, TIBC, IRON, RETICCTPCT in the last 72 hours. Sepsis Labs: No results for input(s): PROCALCITON, LATICACIDVEN in the last 168 hours.  Recent Results (from the past 240 hour(s))  SARS Coronavirus 2 by RT PCR (hospital order, performed in [facility] hospital lab) Nasopharyngeal Nasopharyngeal Swab     Status: None   Collection  Time: 08/01/19 12:01 AM   Specimen: Nasopharyngeal Swab  Result Value Ref Range Status   SARS Coronavirus 2 NEGATIVE NEGATIVE Final    Comment: (NOTE) SARS-CoV-2 target nucleic acids are NOT DETECTED.  The SARS-CoV-2 RNA is generally detectable in upper and lower respiratory specimens during the acute phase of infection. The lowest concentration of SARS-CoV-2 viral copies this assay can detect is 250 copies / mL. A negative result does not preclude SARS-CoV-2 infection and should not be used as the sole basis for treatment or other patient management decisions.  A negative result may occur with improper specimen collection / handling, submission of specimen other than nasopharyngeal swab, presence of viral mutation(s) within the areas targeted by this assay, and inadequate number of viral copies (<250 copies / mL). A negative result must be combined with clinical observations, patient history, and epidemiological information.  Fact Sheet for Patients:   StrictlyIdeas.no  Fact Sheet for Healthcare Providers: BankingDealers.co.za  This test is not yet approved or  cleared by the [place] FDA and has been authorized for detection and/or diagnosis of SARS-CoV-2 by FDA under an Emergency Use Authorization (EUA).  This EUA will remain in effect (meaning this test can be used) for the duration of the COVID-19 declaration under Section 564(b)(1) of the Act, 21 U.S.C. section 360bbb-3(b)(1), unless the authorization is terminated or revoked sooner.  Performed at [facility], [place] [address] [place], [place] [address]      Radiology Studies: CT Abdomen Pelvis W Contrast  Result Date: 07/31/2019 CLINICAL DATA:  Abdomen pain EXAM: CT ABDOMEN AND PELVIS WITH CONTRAST TECHNIQUE: Multidetector CT imaging of the abdomen and pelvis was performed using the standard protocol following bolus administration of intravenous contrast.  CONTRAST:  116mL OMNIPAQUE IOHEXOL 300 MG/ML  SOLN COMPARISON:  01/10/2017 FINDINGS: Lower chest: Lung bases demonstrate no acute consolidation or effusion. Hepatobiliary: Gallbladder is dilated. No calcified stone. No biliary dilatation Pancreas: Unremarkable. No pancreatic ductal dilatation or surrounding inflammatory changes. Spleen: Normal in size without focal abnormality. Adrenals/Urinary Tract: Adrenal glands are normal. Interval bilateral nephrectomy. The bladder  is empty Stomach/Bowel: The stomach is nonenlarged. No dilated small bowel. Status post right hemicolectomy with ileocolic anastomosis in the right mid abdomen. Diffuse diverticular disease of the colon. Minimal inflammatory change at the rectosigmoid colon best seen on coronal views, series 6, image number 55, likely due to acute diverticulitis. Vascular/Lymphatic: Mild to moderate aortic atherosclerosis. No aneurysm. No suspicious adenopathy Reproductive: Prostate is unremarkable. Other: Negative for free air or free fluid. Small fat containing supraumbilical ventral hernia Musculoskeletal: Mild diffuse sclerosis likely due to chronic kidney disease. Advanced degenerative changes of both hips. Large lucent lesions/probable geodes within the left acetabulum and proximal right femur. IMPRESSION: 1. Diffuse diverticular disease of the colon. Subtle inflammatory changes at the rectosigmoid colon, likely due to mild acute diverticulitis. No perforation or abscess. 2. Interval bilateral nephrectomy 3. Distended gallbladder without calcified stone or surrounding inflammation Electronically Signed   By: Donavan Foil M.D.   On: 07/31/2019 22:46    Scheduled Meds: . sodium chloride   Intravenous Once  . cinacalcet  90 mg Oral Q supper  . [START ON 08/03/2019] darbepoetin (ARANESP) injection - DIALYSIS  100 mcg Intravenous Q Mon-HD  . latanoprost  1 drop Both Eyes QHS  . [START ON 08/04/2019] pantoprazole  40 mg Intravenous Q12H  . sodium chloride  flush  3 mL Intravenous Q12H  . sodium chloride flush  3 mL Intravenous Q12H  . sodium zirconium cyclosilicate  10 g Oral TID   Continuous Infusions: . sodium chloride    . pantoprozole (PROTONIX) infusion 8 mg/hr (08/02/19 0659)  . piperacillin-tazobactam (ZOSYN)  IV 2.25 g (08/02/19 0456)     LOS: 2 days   Marylu Lund, MD [facility] Pager On Amion  If 7PM-7AM, please contact night-coverage 08/02/2019, 2:33 PM

## 2019-08-03 ENCOUNTER — Other Ambulatory Visit: Payer: Self-pay | Admitting: Physician Assistant

## 2019-08-03 ENCOUNTER — Encounter (HOSPITAL_COMMUNITY): Payer: Self-pay | Admitting: Gastroenterology

## 2019-08-03 DIAGNOSIS — K250 Acute gastric ulcer with hemorrhage: Secondary | ICD-10-CM

## 2019-08-03 LAB — CBC
HCT: 24.5 % — ABNORMAL LOW (ref 39.0–52.0)
Hemoglobin: 8 g/dL — ABNORMAL LOW (ref 13.0–17.0)
MCH: 28.9 pg (ref 26.0–34.0)
MCHC: 32.7 g/dL (ref 30.0–36.0)
MCV: 88.4 fL (ref 80.0–100.0)
Platelets: 203 10*3/uL (ref 150–400)
RBC: 2.77 MIL/uL — ABNORMAL LOW (ref 4.22–5.81)
RDW: 16.4 % — ABNORMAL HIGH (ref 11.5–15.5)
WBC: 5.9 10*3/uL (ref 4.0–10.5)
nRBC: 0 % (ref 0.0–0.2)

## 2019-08-03 LAB — BASIC METABOLIC PANEL
Anion gap: 16 — ABNORMAL HIGH (ref 5–15)
BUN: 102 mg/dL — ABNORMAL HIGH (ref 6–20)
CO2: 21 mmol/L — ABNORMAL LOW (ref 22–32)
Calcium: 8.5 mg/dL — ABNORMAL LOW (ref 8.9–10.3)
Chloride: 96 mmol/L — ABNORMAL LOW (ref 98–111)
Creatinine, Ser: 11.65 mg/dL — ABNORMAL HIGH (ref 0.61–1.24)
GFR calc Af Amer: 5 mL/min — ABNORMAL LOW (ref >60)
GFR calc non Af Amer: 4 mL/min — ABNORMAL LOW (ref >60)
Glucose, Bld: 91 mg/dL (ref 70–99)
Potassium: 4.8 mmol/L (ref 3.5–5.1)
Sodium: 133 mmol/L — ABNORMAL LOW (ref 135–145)

## 2019-08-03 LAB — HEMOGLOBIN: Hemoglobin: 7.8 g/dL — ABNORMAL LOW (ref 13.0–17.0)

## 2019-08-03 MED ORDER — SODIUM ZIRCONIUM CYCLOSILICATE 10 G PO PACK
10.0000 g | PACK | Freq: Every day | ORAL | Status: DC
  Administered 2019-08-03 – 2019-08-05 (×2): 10 g via ORAL
  Filled 2019-08-03 (×4): qty 1

## 2019-08-03 MED ORDER — PANTOPRAZOLE SODIUM 40 MG PO TBEC
40.0000 mg | DELAYED_RELEASE_TABLET | Freq: Two times a day (BID) | ORAL | Status: DC
  Administered 2019-08-03 – 2019-08-04 (×3): 40 mg via ORAL
  Filled 2019-08-03 (×4): qty 1

## 2019-08-03 MED ORDER — DARBEPOETIN ALFA 100 MCG/0.5ML IJ SOSY
PREFILLED_SYRINGE | INTRAMUSCULAR | Status: AC
  Filled 2019-08-03: qty 0.5

## 2019-08-03 MED ORDER — PANTOPRAZOLE SODIUM 40 MG IV SOLR: 40.0000 mg | Freq: Two times a day (BID) | INTRAVENOUS | Status: DC

## 2019-08-03 NOTE — Treatment Plan (Signed)
Hgb this after noted to be 7.8. Discussed with patient. No bowel movement yet today. Pt denies abd pain. Discussed with GI. Recommendation to continue to monitor overnight. Will repeat CBC in AM. Per GI, rec to make NPO after midnight incase further endoscopic work up may be needed. Pt aware of plan. Will follow

## 2019-08-03 NOTE — Progress Notes (Signed)
PROGRESS NOTE    Carl Orihuela Sr.  RDE:[id number] DOB: [birth year] DOA: 07/31/2019 PCP: [name], MD    Brief Narrative:  59 y.o. male with medical history significant for ESRD on hemodialysis, history of CVA, and now presenting to emergency department for evaluation of dark tarry stools and low blood pressure.  Patient reports that he completed dialysis today, went on to have approximately 5 episodes of dark tarry stool, has had some abdominal cramping and discomfort, saw his PCP for evaluation of this, was found to be hypotensive and directed to the ED.  Patient denies any nausea or vomiting.  He denies fevers or chills.  He reports occasional ibuprofen use, last used 2 or 3 days ago.  He takes aspirin 81 mg every other day and uses omeprazole on an as-needed basis.  He reports that his blood pressure usually runs in the low one hundreds systolic.  He denies feeling lightheaded and denies any chest pain.  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  ED Course: Upon arrival to the ED, patient is found to be afebrile, saturating well on room air, blood pressure 85/56, and heart rate 102.  EKG features a sinus rhythm.  CT the abdomen pelvis notable for diffuse diverticular disease with subtle inflammatory changes at the rectosigmoid colon which could reflect mild acute diverticulitis.  Chemistry panel features a normal potassium, normal bicarbonate, and BUN 55.  CBC notable for hemoglobin of 10.8, down from 11.5 in April 2021.  Type and screen was performed in the ED, 1 L of saline was administered, and the patient was given a dose of Zosyn.  COVID-19 screening test not yet resulted.  Patient refused rectal exam.   Assessment & Plan:   Principal Problem:   Acute GI bleeding Active Problems:   ESRD on dialysis ([place])   History of completed stroke   Refusal of blood transfusions as patient is Jehovah's Witness   Seizures ([place])   Hypotension   Diverticulitis large  intestine   Acute gastric ulcer with hemorrhage   1. Acute GI bleeding with acute blood loss anemia  - Patient reports several episodes of dark tarry stools prior to admit, was hypotensive when he saw his PCP for this and was directed to the ED  - hgb down to 8.5 today -Pt reports dark stool this AM, but states it appears to be resolving - GI consulted and pt now s/p EGD. Findings of pre-pyloric ulcer with gastritis and gastric polyp noted. Continue PPI -Hgb cont to trend down slightly. Repeat hgb of 7.8 this afternoon. Discussed with GI, recommendation to monitor overnight, repeat CBC in AM, and make NPO after midnight incase repeat endoscopic work up needed  2. Hypotension  - BP improved with IVF hydration, currently stable at this time  3. Diverticulitis   - Presents with melena and is found to have subtle inflammatory changes around a rectosigmoid diverticula that could reflect acute diverticulitis without perforation or abscess  - He was started on Zosyn in ED -completed course of abx on 6/20  4. ESRD  - Completed HD on 07/30/19  - No indication for urgent HD on admission but received a liter NS bolus in ED -Appears euvolemic at this time. Cont to monitor - Potassium improved with lokelma - Nephrology following, continue HD as scheduled  5. History of CVA; seizures  - Patient reports no recent seizures and no longer takes antiepileptics  - Cont to hold ASA in light of GIB.  -  No further NSAIDS per GI recs   6. Hyperkalemia - in setting of ESRD per above - Potassium up to 6.4 today - Have continued pt on 10g TID of lokelma, resume BID dosing as potassium has normalized   DVT prophylaxis: SCD's Code Status: Full Family Communication: Pt in room, family not at bedside  Status is: Inpatient  Remains inpatient appropriate because:Ongoing diagnostic testing needed not appropriate for outpatient work up   Dispo: The patient is from: Home              Anticipated d/c  is to: Home              Anticipated d/c date is: 1 day              Patient currently is not medically stable to d/c.  Consultants:   GI  nephrology  Procedures:   EGD 6/19  Antimicrobials: Anti-infectives (From admission, onward)   Start     Dose/Rate Route Frequency Ordered Stop   08/01/19 1400  piperacillin-tazobactam (ZOSYN) IVPB 2.25 g        2.25 g 100 mL/hr over 30 Minutes Intravenous Every 8 hours 08/01/19 1117 08/02/19 2359   08/01/19 0600  piperacillin-tazobactam (ZOSYN) IVPB 3.375 g  Status:  Discontinued        3.375 g 12.5 mL/hr over 240 Minutes Intravenous Every 8 hours 08/01/19 0017 08/01/19 1117   07/31/19 2315  piperacillin-tazobactam (ZOSYN) IVPB 3.375 g        3.375 g 100 mL/hr over 30 Minutes Intravenous  Once 07/31/19 2307 08/01/19 0053      Subjective: Without abd pain or sob. No bowel movement today yet  Objective: Vitals:   08/03/19 1130 08/03/19 1158 08/03/19 1238 08/03/19 1610  BP: 126/64 (!) 129/58 (!) 103/47 104/62  Pulse: 92 77 100 (!) 103  Resp: 12 12 20 18   Temp:  98.4 F (36.9 C) 98.2 F (36.8 C) 98.8 F (37.1 C)  TempSrc:  Oral Oral Oral  SpO2:  100% 96% 97%  Weight:  93.6 kg    Height:        Intake/Output Summary (Last 24 hours) at 08/03/2019 1735 Last data filed at 08/03/2019 1158 Gross per 24 hour  Intake --  Output 1000 ml  Net -1000 ml   Filed Weights   08/03/19 0330 08/03/19 0750 08/03/19 1158  Weight: 95.1 kg 95 kg 93.6 kg    Examination: General exam: Conversant, in no acute distress Respiratory system: normal chest rise, clear, no audible wheezing Cardiovascular system: regular rhythm, s1-s2 Gastrointestinal system: Nondistended, nontender, pos BS Central nervous system: No seizures, no tremors Extremities: No cyanosis, no joint deformities Skin: No rashes, no pallor Psychiatry: Affect normal // no auditory hallucinations   Data Reviewed: I have personally reviewed following labs and imaging  studies  CBC: Recent Labs  Lab 07/31/19 1932 08/01/19 0304 08/02/19 0726 08/03/19 0359 08/03/19 1548  WBC 6.9  --  6.1 5.9  --   HGB 10.8* 9.7* 8.5* 8.0* 7.8*  HCT 35.6* 30.6* 27.0* 24.5*  --   MCV 93.0  --  89.7 88.4  --   PLT 251  --  218 203  --    Basic Metabolic Panel: Recent Labs  Lab 07/31/19 1932 08/01/19 0304 08/02/19 0726 08/02/19 1418 08/03/19 0359  NA 138 136 133*  --  133*  K 4.7 4.7 6.4* 5.1 4.8  CL 96* 97* 96*  --  96*  CO2 26 25 22   --  21*  GLUCOSE 91 78 77  --  91  BUN 55* 72* 92*  --  102*  CREATININE 6.32* 7.04* 9.82*  --  11.65*  CALCIUM 9.6 9.2 9.6  --  8.5*   GFR: Estimated Creatinine Clearance: 7.6 mL/min (A) (by C-G formula based on SCr of 11.65 mg/dL (H)). Liver Function Tests: Recent Labs  Lab 07/31/19 1932  AST 19  ALT 15  ALKPHOS 60  BILITOT 0.5  PROT 8.2*  ALBUMIN 3.2*   Recent Labs  Lab 07/31/19 1932  LIPASE 70*   No results for input(s): AMMONIA in the last 168 hours. Coagulation Profile: No results for input(s): INR, PROTIME in the last 168 hours. Cardiac Enzymes: No results for input(s): CKTOTAL, CKMB, CKMBINDEX, TROPONINI in the last 168 hours. BNP (last 3 results) No results for input(s): PROBNP in the last 8760 hours. HbA1C: No results for input(s): HGBA1C in the last 72 hours. CBG: No results for input(s): GLUCAP in the last 168 hours. Lipid Profile: No results for input(s): CHOL, HDL, LDLCALC, TRIG, CHOLHDL, LDLDIRECT in the last 72 hours. Thyroid Function Tests: No results for input(s): TSH, T4TOTAL, FREET4, T3FREE, THYROIDAB in the last 72 hours. Anemia Panel: No results for input(s): VITAMINB12, FOLATE, FERRITIN, TIBC, IRON, RETICCTPCT in the last 72 hours. Sepsis Labs: No results for input(s): PROCALCITON, LATICACIDVEN in the last 168 hours.  Recent Results (from the past 240 hour(s))  SARS Coronavirus 2 by RT PCR (hospital order, performed in [facility] hospital lab) Nasopharyngeal Nasopharyngeal Swab      Status: None   Collection Time: 08/01/19 12:01 AM   Specimen: Nasopharyngeal Swab  Result Value Ref Range Status   SARS Coronavirus 2 NEGATIVE NEGATIVE Final    Comment: (NOTE) SARS-CoV-2 target nucleic acids are NOT DETECTED.  The SARS-CoV-2 RNA is generally detectable in upper and lower respiratory specimens during the acute phase of infection. The lowest concentration of SARS-CoV-2 viral copies this assay can detect is 250 copies / mL. A negative result does not preclude SARS-CoV-2 infection and should not be used as the sole basis for treatment or other patient management decisions.  A negative result may occur with improper specimen collection / handling, submission of specimen other than nasopharyngeal swab, presence of viral mutation(s) within the areas targeted by this assay, and inadequate number of viral copies (<250 copies / mL). A negative result must be combined with clinical observations, patient history, and epidemiological information.  Fact Sheet for Patients:   StrictlyIdeas.no  Fact Sheet for Healthcare Providers: BankingDealers.co.za  This test is not yet approved or  cleared by the [place] FDA and has been authorized for detection and/or diagnosis of SARS-CoV-2 by FDA under an Emergency Use Authorization (EUA).  This EUA will remain in effect (meaning this test can be used) for the duration of the COVID-19 declaration under Section 564(b)(1) of the Act, 21 U.S.C. section 360bbb-3(b)(1), unless the authorization is terminated or revoked sooner.  Performed at [facility], [place] [address], [place],  [address]      Radiology Studies: No results found.  Scheduled Meds: . sodium chloride   Intravenous Once  . cinacalcet  90 mg Oral Q supper  . darbepoetin (ARANESP) injection - DIALYSIS  100 mcg Intravenous Q Mon-HD  . gabapentin  100 mg Oral BID  . latanoprost  1 drop Both Eyes QHS  .  pantoprazole  40 mg Oral BID  . sevelamer carbonate  1,600 mg Oral TID WC  . sodium chloride  flush  3 mL Intravenous Q12H  . sodium chloride flush  3 mL Intravenous Q12H  . sodium zirconium cyclosilicate  10 g Oral Daily   Continuous Infusions: . sodium chloride       LOS: 3 days   Marylu Lund, MD [facility] Pager On Amion  If 7PM-7AM, please contact night-coverage 08/03/2019, 5:35 PM

## 2019-08-03 NOTE — Progress Notes (Signed)
Report given to HD RN Denny Peon.

## 2019-08-03 NOTE — Progress Notes (Signed)
Progress Note   Subjective  Patient tolerating diet. Still has some dark sediment to his stool but overall much better than admission, he thinks stools are "clearing up". Feeling improved. Hgb has drifted to 8.0.    Objective   Vital signs in last 24 hours: Temp:  [98 F (36.7 C)-98.2 F (36.8 C)] 98.1 F (36.7 C) (06/21 0736) Pulse Rate:  [73-79] 79 (06/21 0736) Resp:  [15-22] 19 (06/21 0736) BP: (98-139)/(56-68) 139/68 (06/21 0736) SpO2:  [96 %-100 %] 98 % (06/21 0736) Weight:  [95.1 kg] 95.1 kg (06/21 0330) Last BM Date: 08/02/19 General:    AA male in NAD Heart:  Regular rate and rhythm Lungs: Respirations even and unlabored, lungs CTA bilaterally Abdomen:  Soft, nontender and nondistended.  Extremities:  Without edema. Neurologic:  Alert and oriented,  grossly normal neurologically. Psych:  Cooperative. Normal mood and affect.  Intake/Output from previous day: No intake/output data recorded. Intake/Output this shift: No intake/output data recorded.  Lab Results: Recent Labs    07/31/19 1932 07/31/19 1932 08/01/19 0304 08/02/19 0726 08/03/19 0359  WBC 6.9  --   --  6.1 5.9  HGB 10.8*   < > 9.7* 8.5* 8.0*  HCT 35.6*   < > 30.6* 27.0* 24.5*  PLT 251  --   --  218 203   < > = values in this interval not displayed.   BMET Recent Labs    08/01/19 0304 08/01/19 0304 08/02/19 0726 08/02/19 1418 08/03/19 0359  NA 136  --  133*  --  133*  K 4.7   < > 6.4* 5.1 4.8  CL 97*  --  96*  --  96*  CO2 25  --  22  --  21*  GLUCOSE 78  --  77  --  91  BUN 72*  --  92*  --  102*  CREATININE 7.04*  --  9.82*  --  11.65*  CALCIUM 9.2  --  9.6  --  8.5*   < > = values in this interval not displayed.   LFT Recent Labs    07/31/19 1932  PROT 8.2*  ALBUMIN 3.2*  AST 19  ALT 15  ALKPHOS 60  BILITOT 0.5   PT/INR No results for input(s): LABPROT, INR in the last 72 hours.  Studies/Results: No results found.     Assessment / Plan:   59 y/o male with  ESRD on HD, Jehovah's witness, presented with melena / hypotension, EGD showed clean based gastric ulcer, no endoscopic therapy performed. Hgb has drifted down since admission however stools clearing up and improved, suspect this could be dilutional from IVF given on admission. He has been on IV PPI and now tolerating diet, has no pain. He had NSAID use for arthritic pain on admission, advised to avoid those completely and use tylenol PRN for his arthritis pain. Overall improved, would repeat Hgb later today to ensure stable, perhaps home later today if stable or tomorrow if there is further downtrend. 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   Recommend: - continue renal diet - continue protonix 40mg  BID, continue orally has outpatient for 1 month and then once daily thereafter - repeat Hgb later today to ensure stable, possible  home later today vs. tomorrow pending results - avoid all NSAIDs as outpatient - await biopsy results, treat if H pylori positive - will need colonoscopy as outpatient, will coordinate follow up with Dr. Hilarie Fredrickson upon discharge  Call with questions, will reassess him later today.  Locust Cellar, MD [facility] Gastroenterology

## 2019-08-03 NOTE — Procedures (Signed)
Patient was seen on dialysis and the procedure was supervised.  BFR 450  Via AVF BP is  124/61.   Patient appears to be tolerating treatment well  [name] 08/03/2019

## 2019-08-03 NOTE — Progress Notes (Addendum)
[facility] Progress Note   Subjective:   Patient seen on HD. Feeling well, no concerns this AM. Denies SOB, CP, palpitations, dizziness, abdominal pain, nausea.   Objective Vitals:   08/03/19 0736 08/03/19 0750 08/03/19 0756 08/03/19 0800  BP: 139/68 (!) 119/59 124/60 122/62  Pulse: 79 77 73 71  Resp: 19 14 19 20   Temp: 98.1 F (36.7 C) 98.1 F (36.7 C)    TempSrc: Oral Oral    SpO2: 98% 99%    Weight:  95 kg    Height:       Physical Exam General: Well developed male, alert and in NAD Heart: RRR, no murmurs, rubs or gallops Lungs: CTA bilaterally without wheezing, rhonchi or rales Abdomen: Soft, non-tender, non-distended. +BS Extremities: No edema b/l lower extremities Dialysis Access:  RUE AVF currently accessed  Additional Objective Labs: Basic Metabolic Panel: Recent Labs  Lab 08/01/19 0304 08/01/19 0304 08/02/19 0726 08/02/19 1418 08/03/19 0359  NA 136  --  133*  --  133*  K 4.7   < > 6.4* 5.1 4.8  CL 97*  --  96*  --  96*  CO2 25  --  22  --  21*  GLUCOSE 78  --  77  --  91  BUN 72*  --  92*  --  102*  CREATININE 7.04*  --  9.82*  --  11.65*  CALCIUM 9.2  --  9.6  --  8.5*   < > = values in this interval not displayed.   Liver Function Tests: Recent Labs  Lab 07/31/19 1932  AST 19  ALT 15  ALKPHOS 60  BILITOT 0.5  PROT 8.2*  ALBUMIN 3.2*   Recent Labs  Lab 07/31/19 1932  LIPASE 70*   CBC: Recent Labs  Lab 07/31/19 1932 07/31/19 1932 08/01/19 0304 08/02/19 0726 08/03/19 0359  WBC 6.9  --   --  6.1 5.9  HGB 10.8*   < > 9.7* 8.5* 8.0*  HCT 35.6*   < > 30.6* 27.0* 24.5*  MCV 93.0  --   --  89.7 88.4  PLT 251  --   --  218 203   < > = values in this interval not displayed.   Blood Culture    Component Value Date/Time   SDES BLOOD LEFT HAND 08/01/2015 0624   SPECREQUEST BOTTLES DRAWN AEROBIC AND ANAEROBIC  5CC 08/01/2015 0624   CULT NO GROWTH 5 DAYS 08/01/2015 0624   REPTSTATUS 08/06/2015 FINAL 08/01/2015 0624     Medications: . sodium chloride     . sodium chloride   Intravenous Once  . cinacalcet  90 mg Oral Q supper  . darbepoetin (ARANESP) injection - DIALYSIS  100 mcg Intravenous Q Mon-HD  . gabapentin  100 mg Oral BID  . latanoprost  1 drop Both Eyes QHS  . pantoprazole (PROTONIX) IV  40 mg Intravenous Q12H  . sevelamer carbonate  1,600 mg Oral TID WC  . sodium chloride flush  3 mL Intravenous Q12H  . sodium chloride flush  3 mL Intravenous Q12H  . sodium zirconium cyclosilicate  10 g Oral TID    Dialysis Orders: [facility] MWF 4h 26min 450/800 EDW 91kg 2K/2Ca AVF Hep 3000 Hectorol 10 Sensipar 90 Fosrenol 1000 TID + Renvela 6 tid  Mircera 75 q 4 weeks (last dose 50 on 6/9)   Assessment/Plan: 1. GI Bleed/Melena. GI consulted s/p EGD with gastritis and gastric polyp. Biopsy results pending. Recommending protonix BID and d/c home today  or tomorrow if Hgb is stable, outpatient colonoscopy. 2. Hyperkalemia. K+ 6.4 on admission and treated with lokelma, now resolved. Chronic issue. Lokelma 10 daily on outpatient med list but has been out of meds. Pre HD today K is 4.8 3. ESRD -  HD MWF. HD today per regular schedule, tolerating well.  4. Hypertension/volume  - Hypotensive s/p 1L IVF bolus. No gross overload on exam. Minimal UF on HD  5. Anemia  - Hgb trending down. 8.0 today. Receiving aranesp 119mcg with HD today.  6. Metabolic bone disease - Calcium at goal. Continue Sensipar/binders when eating  7. Nutrition - Renal diet/vitamins when eating.    [name] Paganini, PA-C 08/03/2019, 8:30 AM  [facility] Pager: [phone]  Patient seen and examined, agree with above note with above modifications. Seen on HD - no c/o's-  Awaiting stability of hgb for discharge.... not yet.  K has been controlled on lokelma  Corliss Parish, MD 08/03/2019

## 2019-08-04 ENCOUNTER — Inpatient Hospital Stay (HOSPITAL_COMMUNITY): Payer: Medicare Other | Admitting: Anesthesiology

## 2019-08-04 ENCOUNTER — Encounter (HOSPITAL_COMMUNITY)
Admission: EM | Disposition: A | Discharge status: Home / Self Care | Payer: Self-pay | Source: Ambulatory Visit | Attending: Internal Medicine

## 2019-08-04 DIAGNOSIS — K2980 Duodenitis without bleeding: Secondary | ICD-10-CM

## 2019-08-04 DIAGNOSIS — K317 Polyp of stomach and duodenum: Secondary | ICD-10-CM

## 2019-08-04 DIAGNOSIS — K250 Acute gastric ulcer with hemorrhage: Principal | ICD-10-CM

## 2019-08-04 DIAGNOSIS — K921 Melena: Secondary | ICD-10-CM

## 2019-08-04 DIAGNOSIS — K259 Gastric ulcer, unspecified as acute or chronic, without hemorrhage or perforation: Secondary | ICD-10-CM

## 2019-08-04 HISTORY — PX: ESOPHAGOGASTRODUODENOSCOPY (EGD) WITH PROPOFOL: SHX5813

## 2019-08-04 LAB — BASIC METABOLIC PANEL
Anion gap: 13 (ref 5–15)
BUN: 48 mg/dL — ABNORMAL HIGH (ref 6–20)
CO2: 26 mmol/L (ref 22–32)
Calcium: 8.5 mg/dL — ABNORMAL LOW (ref 8.9–10.3)
Chloride: 95 mmol/L — ABNORMAL LOW (ref 98–111)
Creatinine, Ser: 8.5 mg/dL — ABNORMAL HIGH (ref 0.61–1.24)
GFR calc Af Amer: 7 mL/min — ABNORMAL LOW (ref >60)
GFR calc non Af Amer: 6 mL/min — ABNORMAL LOW (ref >60)
Glucose, Bld: 87 mg/dL (ref 70–99)
Potassium: 3.7 mmol/L (ref 3.5–5.1)
Sodium: 134 mmol/L — ABNORMAL LOW (ref 135–145)

## 2019-08-04 LAB — CBC
HCT: 22.7 % — ABNORMAL LOW (ref 39.0–52.0)
Hemoglobin: 7.5 g/dL — ABNORMAL LOW (ref 13.0–17.0)
MCH: 29.3 pg (ref 26.0–34.0)
MCHC: 33 g/dL (ref 30.0–36.0)
MCV: 88.7 fL (ref 80.0–100.0)
Platelets: 187 10*3/uL (ref 150–400)
RBC: 2.56 MIL/uL — ABNORMAL LOW (ref 4.22–5.81)
RDW: 16.4 % — ABNORMAL HIGH (ref 11.5–15.5)
WBC: 5.1 10*3/uL (ref 4.0–10.5)
nRBC: 0 % (ref 0.0–0.2)

## 2019-08-04 LAB — SURGICAL PATHOLOGY

## 2019-08-04 SURGERY — ESOPHAGOGASTRODUODENOSCOPY (EGD) WITH PROPOFOL
Anesthesia: Monitor Anesthesia Care

## 2019-08-04 MED ORDER — SODIUM CHLORIDE 0.9 % IV SOLN: 510.0000 mg | Freq: Once | INTRAVENOUS | Status: DC

## 2019-08-04 MED ORDER — PANTOPRAZOLE SODIUM 40 MG IV SOLR
40.0000 mg | Freq: Two times a day (BID) | INTRAVENOUS | Status: DC
  Administered 2019-08-04 – 2019-08-05 (×2): 40 mg via INTRAVENOUS
  Filled 2019-08-04 (×2): qty 40

## 2019-08-04 MED ORDER — SODIUM CHLORIDE 0.9 % IV SOLN
510.0000 mg | Freq: Once | INTRAVENOUS | Status: AC
  Administered 2019-08-04: 510 mg via INTRAVENOUS
  Filled 2019-08-04: qty 17

## 2019-08-04 MED ORDER — SODIUM CHLORIDE 0.9 % IV SOLN
INTRAVENOUS | Status: AC | PRN
  Administered 2019-08-04: 500 mL via INTRAMUSCULAR

## 2019-08-04 MED ORDER — SODIUM CHLORIDE 0.9 % IV SOLN
INTRAVENOUS | Status: DC | PRN
Start: 2019-08-04 — End: 2019-08-04

## 2019-08-04 MED ORDER — PROPOFOL 500 MG/50ML IV EMUL
INTRAVENOUS | Status: DC | PRN
  Administered 2019-08-04: 125 ug/kg/min via INTRAVENOUS

## 2019-08-04 MED ORDER — CHLORHEXIDINE GLUCONATE CLOTH 2 % EX PADS
6.0000 | MEDICATED_PAD | Freq: Every day | CUTANEOUS | Status: DC
  Administered 2019-08-04 – 2019-08-05 (×2): 6 via TOPICAL

## 2019-08-04 MED ORDER — PROPOFOL 10 MG/ML IV BOLUS
INTRAVENOUS | Status: DC | PRN
  Administered 2019-08-04: 50 mg via INTRAVENOUS
  Administered 2019-08-04: 20 mg via INTRAVENOUS
  Administered 2019-08-04: 100 mg via INTRAVENOUS
  Administered 2019-08-04: 30 mg via INTRAVENOUS

## 2019-08-04 SURGICAL SUPPLY — 15 items

## 2019-08-04 NOTE — Op Note (Signed)
[facility] Patient Name: [name] Procedure Date : 08/04/2019 MRN: [id number] Attending MD: Carlota Raspberry. Havery Moros , MD Date of Birth: [birth year] CSN: [id number] Age: 59 Admit Type: Inpatient Procedure:                Upper GI endoscopy Indications:              Recent upper GI bleed suspected due to pyloric                            ulcer, EGD on 08/01/19 with clean based ulcer and                            duodenitis. Patient has had some black stools since                            then and drifting Hgb, stools clearing, Hgb now                            7.5. Jehovah's witness and declines blood products.                            EGD done to relook at the ulcer and ensure no high                            risk stigmata or active bleeding given downtrending                            Hgb Providers:                Carlota Raspberry. Havery Moros, MD, Vista Lawman, RN,                            William Dalton, Technician Referring MD:              Medicines:                Monitored Anesthesia Care Complications:            No immediate complications. Estimated blood loss:                            None. Estimated Blood Loss:     Estimated blood loss: none. Procedure:                Pre-Anesthesia Assessment:                           - Prior to the procedure, a History and Physical                            was performed, and patient medications and                            allergies were reviewed. The patient's tolerance of  previous anesthesia was also reviewed. The risks                            and benefits of the procedure and the sedation                            options and risks were discussed with the patient.                            All questions were answered, and informed consent                            was obtained. Prior Anticoagulants: The patient has                            taken no previous anticoagulant or  antiplatelet                            agents. ASA Grade Assessment: III - A patient with                            severe systemic disease. After reviewing the risks                            and benefits, the patient was deemed in                            satisfactory condition to undergo the procedure.                           After obtaining informed consent, the endoscope was                            passed under direct vision. Throughout the                            procedure, the patient's blood pressure, pulse, and                            oxygen saturations were monitored continuously. The                            [id number]-H190 ([id number]) Olympus gastroscope was                            introduced through the mouth, and advanced to the                            second part of duodenum. The upper GI endoscopy was                            accomplished without difficulty. The patient  tolerated the procedure well. Scope In: Scope Out: Findings:      Esophagogastric landmarks were identified: the Z-line was found at 38       cm, the gastroesophageal junction was found at 38 cm and the upper       extent of the gastric folds was found at 38 cm from the incisors.      The exam of the esophagus was otherwise normal.      Localized polypoid mucosa 5-78mm in size, with superficial erythematous       mucosa was found in the gastric body without any stigmata for bleeding,       biopsies not taken given downtrending Hgb as above.      One clean based gastric ulcer with no stigmata of bleeding was found at       the pyloric channel, at the 6 o'clock position, and was difficult to       visualize (on prior exam appeared to have prolapsed out from the pyloric       channel). The lesion was 4-5 mm in largest dimension. NO heme in the       stomach or small bowel. No endoscopic therapy performed given no       stigmata for bleeding, and would be technically  quite challenging to get       good position to treat the ulcer given its location (for clip placement).      The exam of the stomach was otherwise normal.      Patchy erythematous mucosa with hypertrophied folds was found in the       duodenal bulb and in the second portion of the duodenum, prolapsing in       and out of the bulb. Duodenum was difficult to clear in this setting but       no ulcerations or stigmata for bleeding noted.      The exam of the duodenum was otherwise normal. Impression:               - Esophagogastric landmarks identified.                           - Normal esophagus,                           - Benign appearing focal polypoid mucosa with                            superficial erythematous mucosa in the gastric body                            - no stigmata for bleeding, I do not think causing                            the patient's symptoms.                           - Gastric ulcer in the pyloric channel with no                            stigmata of bleeding - healing on PPI.                           -  NO heme in the stomach or anywhere in the bowel.                           - Erythematous duodenopathy with hypertrophied                            folds as outlined - no stigmata for bleeding.                           Overall, gastric ulcer appears to be healing on                            PPI, hopefully dark stools represented dark blood,                            no active bleeding noted. Recommendation:           - Return patient to hospital ward for ongoing care.                           - Resume regular diet.                           - Continue present medications.                           - Repeat Hgb in the AM Procedure Code(s):        --- Professional ---                           [phone], Esophagogastroduodenoscopy, flexible,                            transoral; diagnostic, including collection of                            specimen(s) by  brushing or washing, when performed                            (separate procedure) Diagnosis Code(s):        --- Professional ---                           K31.89, Other diseases of stomach and duodenum                           K25.9, Gastric ulcer, unspecified as acute or                            chronic, without hemorrhage or perforation                           K25.0, Acute gastric ulcer with hemorrhage CPT copyright 2019 American [employer]. All rights reserved. The codes documented in this report are preliminary and upon coder review may  be revised to meet current compliance requirements. Remo Lipps P. Havery Moros, MD  08/04/2019 6:22:16 PM This report has been signed electronically. Number of Addenda: 0

## 2019-08-04 NOTE — H&P (View-Only) (Signed)
Progress Note   Subjective  Patient feeling okay, no pains. He thinks stool is now brown overnight, but Hgb has drifted to 7.5. He is hoping to avoid all blood products given his religious beliefs.   Objective   Vital signs in last 24 hours: Temp:  [98.2 F (36.8 C)-98.9 F (37.2 C)] 98.3 F (36.8 C) (06/22 0759) Pulse Rate:  [77-103] 87 (06/22 0759) Resp:  [10-20] 17 (06/22 0759) BP: (100-130)/(44-85) 113/62 (06/22 0759) SpO2:  [96 %-100 %] 100 % (06/22 0759) Weight:  [93.6 kg-94.6 kg] 94.6 kg (06/22 0410) Last BM Date: 08/03/19 General:   AA male in NAD Heart:  Regular rate and rhythm Lungs: Respirations even and unlabored, lungs CTA bilaterally Abdomen:  Soft, nontender and nondistended.  Extremities:  Without edema. Neurologic:  Alert and oriented,  grossly normal neurologically. Psych:  Cooperative. Normal mood and affect.  Intake/Output from previous day: 06/21 0701 - 06/22 0700 In: -  Out: 1000  Intake/Output this shift: No intake/output data recorded.  Lab Results: Recent Labs    08/02/19 0726 08/02/19 0726 08/03/19 0359 08/03/19 1548 08/04/19 0314  WBC 6.1  --  5.9  --  5.1  HGB 8.5*   < > 8.0* 7.8* 7.5*  HCT 27.0*  --  24.5*  --  22.7*  PLT 218  --  203  --  187   < > = values in this interval not displayed.   BMET Recent Labs    08/02/19 0726 08/02/19 0726 08/02/19 1418 08/03/19 0359 08/04/19 0314  NA 133*  --   --  133* 134*  K 6.4*   < > 5.1 4.8 3.7  CL 96*  --   --  96* 95*  CO2 22  --   --  21* 26  GLUCOSE 77  --   --  91 87  BUN 92*  --   --  102* 48*  CREATININE 9.82*  --   --  11.65* 8.50*  CALCIUM 9.6  --   --  8.5* 8.5*   < > = values in this interval not displayed.   LFT No results for input(s): PROT, ALBUMIN, AST, ALT, ALKPHOS, BILITOT, BILIDIR, IBILI in the last 72 hours. PT/INR No results for input(s): LABPROT, INR in the last 72 hours.  Studies/Results: No results found.     Assessment / Plan:   59 y/o male  with ESRD on HD, Jehovah's witness, presented with melena / hypotension, EGD showed clean based gastric ulcer on 08/01/19, no endoscopic therapy performed. Hgb has continued to drift down since the procedure unfortunately, now at 7.5. He was having dark stools up until last night, he thinks now brown. Not able to trend BUN given ESRD.   Hopefully he has stopped bleeding, the ulcer did not have any high risk stigmata for bleeding and stools are improved but Hgb has not stabilized yet. If it continues to downtrend he does not want a blood transfusion, I discussed cardiovascular risks of declining blood transfusion should his Hgb get to a level where he would warrant it. I discussed options with him. His Hgb is stable at this time where he should be able to tolerate repeat EGD to look at the ulcer and ensure no high risk stigmata or evidence of rebleeding, and offered that to him given the downtrending Hgb. His other option would be continued PPI and monitoring however if his Hgb continues to downtrend over the next 24-48 hours, he would be  higher risk for anesthesia without a transfusion. After discussion of options, he wanted to proceed with EGD today to reassess the ulcer. Please continue PPI and keep NPO, anticipate this will be done later this afternoon pending anesthesia availability.   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   Recommend: - NPO for now - repeat EGD later today - continue IV protonix - would give dose of IV iron today given he wishes to avoid transfusion if it comes to that - monitor for recurrent bleeding  Call with questions.  Valeria Cellar, MD [facility]

## 2019-08-04 NOTE — Anesthesia Preprocedure Evaluation (Signed)
Anesthesia Evaluation  Patient identified by MRN, date of birth, ID band Patient awake    Reviewed: Allergy & Precautions, NPO status , Patient's Chart, lab work & pertinent test results  History of Anesthesia Complications Negative for: history of anesthetic complications  Airway Mallampati: II  TM Distance: >3 FB Neck ROM: Full    Dental  (+) Partial Upper, Dental Advisory Given   Pulmonary neg pulmonary ROS, former smoker,    Pulmonary exam normal breath sounds clear to auscultation       Cardiovascular hypertension, Normal cardiovascular exam Rhythm:Regular Rate:Normal     Neuro/Psych CVA negative psych ROS   GI/Hepatic Neg liver ROS, PUD, GERD  ,  Endo/Other  negative endocrine ROS  Renal/GU DialysisRenal disease  negative genitourinary   Musculoskeletal negative musculoskeletal ROS (+)   Abdominal   Peds negative pediatric ROS (+)  Hematology  (+) anemia ,   Anesthesia Other Findings   Reproductive/Obstetrics negative OB ROS                             Anesthesia Physical  Anesthesia Plan  ASA: III  Anesthesia Plan: MAC   Post-op Pain Management:    Induction: Intravenous  PONV Risk Score and Plan: 1 and Ondansetron  Airway Management Planned: Natural Airway and Nasal Cannula  Additional Equipment:   Intra-op Plan:   Post-operative Plan:   Informed Consent: I have reviewed the patients History and Physical, chart, labs and discussed the procedure including the risks, benefits and alternatives for the proposed anesthesia with the patient or authorized representative who has indicated his/her understanding and acceptance.     Dental advisory given  Plan Discussed with: Anesthesiologist  Anesthesia Plan Comments:         Anesthesia Quick Evaluation

## 2019-08-04 NOTE — Anesthesia Postprocedure Evaluation (Signed)
Anesthesia Post Note  Patient: Carl Fuqua Sr.  Procedure(s) Performed: ESOPHAGOGASTRODUODENOSCOPY (EGD) WITH PROPOFOL (N/A )     Patient location during evaluation: PACU Anesthesia Type: MAC Level of consciousness: awake and alert Pain management: pain level controlled Vital Signs Assessment: post-procedure vital signs reviewed and stable Respiratory status: spontaneous breathing and respiratory function stable Cardiovascular status: stable Postop Assessment: no apparent nausea or vomiting Anesthetic complications: no   No complications documented.  Last Vitals:  Vitals:   08/04/19 1840 08/04/19 1934  BP: 139/82 130/73  Pulse: 86 84  Resp: 16 11  Temp:  36.5 C  SpO2: 100% 95%    Last Pain:  Vitals:   08/04/19 1934  TempSrc: Oral  PainSc:                  Cully Luckow DANIEL

## 2019-08-04 NOTE — Transfer of Care (Signed)
Immediate Anesthesia Transfer of Care Note  Patient: Carl Ortwein Sr.  Procedure(s) Performed: ESOPHAGOGASTRODUODENOSCOPY (EGD) WITH PROPOFOL (N/A )  Patient Location: Endoscopy Unit  Anesthesia Type:MAC  Level of Consciousness: awake, drowsy, patient cooperative and Patient remains intubated per anesthesia plan  Airway & Oxygen Therapy: Patient Spontanous Breathing and Patient connected to nasal cannula oxygen  Post-op Assessment: Report given to RN and Post -op Vital signs reviewed and stable  Post vital signs: Reviewed and stable  Last Vitals:  Vitals Value Taken Time  BP    Temp    Pulse    Resp    SpO2      Last Pain:  Vitals:   08/04/19 1704  TempSrc: Oral  PainSc: 0-No pain         Complications: No complications documented.

## 2019-08-04 NOTE — Progress Notes (Addendum)
[facility] Progress Note   Subjective:   Pt seen in room. Hgb drifted down further overnight. GI planning endoscopy. Pt would like to avoid blood transfusions. He denies any further bleeding. Denies SOB, CP, palpitations, abdominal pain, N/V/D.   Objective Vitals:   08/03/19 2004 08/04/19 0011 08/04/19 0410 08/04/19 0759  BP: (!) 111/58 (!) 100/44 130/85 113/62  Pulse: 90 88 98 87  Resp: 19 16 15 17   Temp: 98.8 F (37.1 C) 98.9 F (37.2 C) 98.5 F (36.9 C) 98.3 F (36.8 C)  TempSrc: Oral Oral Oral Oral  SpO2: 99% 100% 96% 100%  Weight:   94.6 kg   Height:       Physical Exam General: Well developed male, alert and in NAD Heart: RRR, no murmurs, rubs or gallops Lungs: CTA bilaterally without wheezing, rhonchi or rales Abdomen: Soft, non-tender, non-distended. +BS Extremities: No edema b/l lower extremities Dialysis Access:  RUE AVF + bruit, small superficial scab but no active bleeding/drainage   Additional Objective Labs: Basic Metabolic Panel: Recent Labs  Lab 08/02/19 0726 08/02/19 0726 08/02/19 1418 08/03/19 0359 08/04/19 0314  NA 133*  --   --  133* 134*  K 6.4*   < > 5.1 4.8 3.7  CL 96*  --   --  96* 95*  CO2 22  --   --  21* 26  GLUCOSE 77  --   --  91 87  BUN 92*  --   --  102* 48*  CREATININE 9.82*  --   --  11.65* 8.50*  CALCIUM 9.6  --   --  8.5* 8.5*   < > = values in this interval not displayed.   Liver Function Tests: Recent Labs  Lab 07/31/19 1932  AST 19  ALT 15  ALKPHOS 60  BILITOT 0.5  PROT 8.2*  ALBUMIN 3.2*   Recent Labs  Lab 07/31/19 1932  LIPASE 70*   CBC: Recent Labs  Lab 07/31/19 1932 08/01/19 0304 08/02/19 0726 08/02/19 0726 08/03/19 0359 08/03/19 1548 08/04/19 0314  WBC 6.9  --  6.1  --  5.9  --  5.1  HGB 10.8*   < > 8.5*   < > 8.0* 7.8* 7.5*  HCT 35.6*   < > 27.0*  --  24.5*  --  22.7*  MCV 93.0  --  89.7  --  88.4  --  88.7  PLT 251  --  218  --  203  --  187   < > = values in this interval  not displayed.   Blood Culture    Component Value Date/Time   SDES BLOOD LEFT HAND 08/01/2015 0624   SPECREQUEST BOTTLES DRAWN AEROBIC AND ANAEROBIC  5CC 08/01/2015 0624   CULT NO GROWTH 5 DAYS 08/01/2015 0624   REPTSTATUS 08/06/2015 FINAL 08/01/2015 0624    Medications: . sodium chloride    . ferumoxytol     . sodium chloride   Intravenous Once  . cinacalcet  90 mg Oral Q supper  . darbepoetin (ARANESP) injection - DIALYSIS  100 mcg Intravenous Q Mon-HD  . gabapentin  100 mg Oral BID  . latanoprost  1 drop Both Eyes QHS  . pantoprazole  40 mg Oral BID  . sevelamer carbonate  1,600 mg Oral TID WC  . sodium chloride flush  3 mL Intravenous Q12H  . sodium chloride flush  3 mL Intravenous Q12H  . sodium zirconium cyclosilicate  10 g Oral Daily    Dialysis Orders:  [facility] MWF 4h 5min 450/800 EDW 91kg 2K/2Ca AVF Hep 3000 Hectorol 10 Sensipar 90 Fosrenol 1000 TID+ Renvela 6 tid Mircera 75 q 4 weeks (last dose 50 on 6/9)   Assessment/Plan: 1. GI Bleed/Melena. GI consulted s/p EGD with gastritis and gastric polyp. Biopsy results pending. Recommending protonix BID and d/c home today or tomorrow if Hgb is stable, outpatient colonoscopy. 2. Hyperkalemia. K+ 6.4 on admission and treated with lokelma, now resolved. Chronic issue. Lokelma 10 daily on outpatient med list but reports he was unable to take lokelma over the weekend (does have med at home). Resolved- K+ 3.7. 3. ESRD -HD MWF. HD tomorrow per regular schedule. No heparin with HD.  4. Hypertension/volume - Hypotensive on admission, s/p 1L IVF bolus, BP now improved. No gross overload on exam. Minimal UF on HD 5. Anemia - Hgb trending down. 7.5 today. Received aranesp 193mcg with HD on 6/21. GI ordered a dose of feraheme today.    6. Metabolic bone disease -Calcium at goal.Continue Sensipar/binders when eating 7. Nutrition -Renal diet/vitamins when eating.  [name] Paganini, PA-C 08/04/2019, 10:39 AM  [facility] Pager: [phone]  Patient seen and examined, agree with above note with above modifications. Pt sitting up on bedside chair when I see him-  Totally asymptomatic-  Unfortunately his hgb trending down -  For repeat EGD today.  He is hoping he will be able to go home after HD tomorrow if his hgb is stable  Corliss Parish, MD 08/04/2019

## 2019-08-04 NOTE — Interval H&P Note (Signed)
History and Physical Interval Note:  08/04/2019 5:33 PM  Carl Found Sr.  has presented today for surgery, with the diagnosis of gastric ulcer.  The various methods of treatment have been discussed with the patient and family. After consideration of risks, benefits and other options for treatment, the patient has consented to  Procedure(s): ESOPHAGOGASTRODUODENOSCOPY (EGD) WITH PROPOFOL (N/A) as a surgical intervention.  The patient's history has been reviewed, patient examined, no change in status, stable for surgery.  I have reviewed the patient's chart and labs.  Questions were answered to the patient's satisfaction.     [place]

## 2019-08-04 NOTE — Progress Notes (Signed)
PROGRESS NOTE    Carl Zwiefelhofer Sr.  UMP:[id number] DOB: [birth year] DOA: 07/31/2019 PCP: [name], MD    Brief Narrative:  59 y.o. male with medical history significant for ESRD on hemodialysis, history of CVA, and now presenting to emergency department for evaluation of dark tarry stools and low blood pressure.  Patient reports that he completed dialysis today, went on to have approximately 5 episodes of dark tarry stool, has had some abdominal cramping and discomfort, saw his PCP for evaluation of this, was found to be hypotensive and directed to the ED.  Patient denies any nausea or vomiting.  He denies fevers or chills.  He reports occasional ibuprofen use, last used 2 or 3 days ago.  He takes aspirin 81 mg every other day and uses omeprazole on an as-needed basis.  He reports that his blood pressure usually runs in the low one hundreds systolic.  He denies feeling lightheaded and denies any chest pain.  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  ED Course: Upon arrival to the ED, patient is found to be afebrile, saturating well on room air, blood pressure 85/56, and heart rate 102.  EKG features a sinus rhythm.  CT the abdomen pelvis notable for diffuse diverticular disease with subtle inflammatory changes at the rectosigmoid colon which could reflect mild acute diverticulitis.  Chemistry panel features a normal potassium, normal bicarbonate, and BUN 55.  CBC notable for hemoglobin of 10.8, down from 11.5 in April 2021.  Type and screen was performed in the ED, 1 L of saline was administered, and the patient was given a dose of Zosyn.  COVID-19 screening test not yet resulted.  Patient refused rectal exam.   Assessment & Plan:   Principal Problem:   Acute GI bleeding Active Problems:   ESRD on dialysis ([name])   History of completed stroke   Refusal of blood transfusions as patient is Jehovah's Witness   Seizures ([name])   Hypotension   Diverticulitis large  intestine   Acute gastric ulcer with hemorrhage   1. Acute GI bleeding with acute blood loss anemia  - Patient reports several episodes of dark tarry stools prior to admit, was hypotensive when he saw his PCP for this and was directed to the ED  - hgb down to 8.5 today -Pt reports dark stool this AM, but states it appears to be resolving - GI consulted and pt now s/p EGD. Findings of pre-pyloric ulcer with gastritis and gastric polyp noted. Continue PPI -Hgb cont to trend down slightly. Repeat hgb just under 8. Pt underwent repeat EGD 6/22. Discussed with GI. Findings of healing ulcer. Suspect lower hgb is a result of stabilization of hgb after recent bleed -Will repeat CBC in AM. Possible d/c 6/23 if hgb remains stable. Discussed with GI  2. Hypotension  - BP improved with IVF hydration, currently stable currently  3. Diverticulitis   - Presents with melena and is found to have subtle inflammatory changes around a rectosigmoid diverticula that could reflect acute diverticulitis without perforation or abscess  - He was started on Zosyn in ED -Has completed course of abx on 6/20  4. ESRD  - Completed HD on 07/30/19  - No indication for urgent HD on admission but received a liter NS bolus in ED -Appears euvolemic at this time. Cont to monitor - Potassium improved with lokelma - Nephrology following, continue HD as scheduled  5. History of CVA; seizures  - Patient reports no recent seizures  and no longer takes antiepileptics  - Cont to hold ASA in light of GIB.  -Will defer to GI as to when to resume ASA  6. Hyperkalemia - in setting of ESRD per above -Normalized with lokelma and HD - Will continue home BID dosing of lokelma  DVT prophylaxis: SCD's Code Status: Full Family Communication: Pt in room, family not at bedside  Status is: Inpatient  Remains inpatient appropriate because:Ongoing diagnostic testing needed not appropriate for outpatient work up   Dispo: The  patient is from: Home              Anticipated d/c is to: Home              Anticipated d/c date is: 1 day              Patient currently is not medically stable to d/c.  Consultants:   GI  nephrology  Procedures:   EGD 6/19  Antimicrobials: Anti-infectives (From admission, onward)   Start     Dose/Rate Route Frequency Ordered Stop   08/01/19 1400  piperacillin-tazobactam (ZOSYN) IVPB 2.25 g        2.25 g 100 mL/hr over 30 Minutes Intravenous Every 8 hours 08/01/19 1117 08/02/19 2359   08/01/19 0600  piperacillin-tazobactam (ZOSYN) IVPB 3.375 g  Status:  Discontinued        3.375 g 12.5 mL/hr over 240 Minutes Intravenous Every 8 hours 08/01/19 0017 08/01/19 1117   07/31/19 2315  piperacillin-tazobactam (ZOSYN) IVPB 3.375 g        3.375 g 100 mL/hr over 30 Minutes Intravenous  Once 07/31/19 2307 08/01/19 0053      Subjective: Eager to go home soon  Objective: Vitals:   08/04/19 1813 08/04/19 1823 08/04/19 1830 08/04/19 1840  BP: 108/65 122/76 109/80 139/82  Pulse: 85 84  86  Resp: 12 17 15 16   Temp: 98.6 F (37 C)     TempSrc: Oral     SpO2: 100% 100% 100% 100%  Weight:      Height:        Intake/Output Summary (Last 24 hours) at 08/04/2019 1844 Last data filed at 08/04/2019 1810 Gross per 24 hour  Intake 100 ml  Output --  Net 100 ml   Filed Weights   08/03/19 1158 08/04/19 0410 08/04/19 1704  Weight: 93.6 kg 94.6 kg 94.6 kg    Examination: General exam: Awake, laying in bed, in nad Respiratory system: Normal respiratory effort, no wheezing Cardiovascular system: regular rate, s1, s2 Gastrointestinal system: Soft, nondistended, positive BS Central nervous system: CN2-12 grossly intact, strength intact Extremities: Perfused, no clubbing Skin: Normal skin turgor, no notable skin lesions seen Psychiatry: Mood normal // no visual hallucinations   Data Reviewed: I have personally reviewed following labs and imaging studies  CBC: Recent Labs  Lab  07/31/19 1932 07/31/19 1932 08/01/19 0304 08/02/19 0726 08/03/19 0359 08/03/19 1548 08/04/19 0314  WBC 6.9  --   --  6.1 5.9  --  5.1  HGB 10.8*   < > 9.7* 8.5* 8.0* 7.8* 7.5*  HCT 35.6*  --  30.6* 27.0* 24.5*  --  22.7*  MCV 93.0  --   --  89.7 88.4  --  88.7  PLT 251  --   --  218 203  --  187   < > = values in this interval not displayed.   Basic Metabolic Panel: Recent Labs  Lab 07/31/19 1932 07/31/19 1932 08/01/19 0304 08/02/19 0726 08/02/19 1418  08/03/19 0359 08/04/19 0314  NA 138  --  136 133*  --  133* 134*  K 4.7   < > 4.7 6.4* 5.1 4.8 3.7  CL 96*  --  97* 96*  --  96* 95*  CO2 26  --  25 22  --  21* 26  GLUCOSE 91  --  78 77  --  91 87  BUN 55*  --  72* 92*  --  102* 48*  CREATININE 6.32*  --  7.04* 9.82*  --  11.65* 8.50*  CALCIUM 9.6  --  9.2 9.6  --  8.5* 8.5*   < > = values in this interval not displayed.   GFR: Estimated Creatinine Clearance: 10.4 mL/min (A) (by C-G formula based on SCr of 8.5 mg/dL (H)). Liver Function Tests: Recent Labs  Lab 07/31/19 1932  AST 19  ALT 15  ALKPHOS 60  BILITOT 0.5  PROT 8.2*  ALBUMIN 3.2*   Recent Labs  Lab 07/31/19 1932  LIPASE 70*   No results for input(s): AMMONIA in the last 168 hours. Coagulation Profile: No results for input(s): INR, PROTIME in the last 168 hours. Cardiac Enzymes: No results for input(s): CKTOTAL, CKMB, CKMBINDEX, TROPONINI in the last 168 hours. BNP (last 3 results) No results for input(s): PROBNP in the last 8760 hours. HbA1C: No results for input(s): HGBA1C in the last 72 hours. CBG: No results for input(s): GLUCAP in the last 168 hours. Lipid Profile: No results for input(s): CHOL, HDL, LDLCALC, TRIG, CHOLHDL, LDLDIRECT in the last 72 hours. Thyroid Function Tests: No results for input(s): TSH, T4TOTAL, FREET4, T3FREE, THYROIDAB in the last 72 hours. Anemia Panel: No results for input(s): VITAMINB12, FOLATE, FERRITIN, TIBC, IRON, RETICCTPCT in the last 72 hours. Sepsis  Labs: No results for input(s): PROCALCITON, LATICACIDVEN in the last 168 hours.  Recent Results (from the past 240 hour(s))  SARS Coronavirus 2 by RT PCR (hospital order, performed in [facility] hospital lab) Nasopharyngeal Nasopharyngeal Swab     Status: None   Collection Time: 08/01/19 12:01 AM   Specimen: Nasopharyngeal Swab  Result Value Ref Range Status   SARS Coronavirus 2 NEGATIVE NEGATIVE Final    Comment: (NOTE) SARS-CoV-2 target nucleic acids are NOT DETECTED.  The SARS-CoV-2 RNA is generally detectable in upper and lower respiratory specimens during the acute phase of infection. The lowest concentration of SARS-CoV-2 viral copies this assay can detect is 250 copies / mL. A negative result does not preclude SARS-CoV-2 infection and should not be used as the sole basis for treatment or other patient management decisions.  A negative result may occur with improper specimen collection / handling, submission of specimen other than nasopharyngeal swab, presence of viral mutation(s) within the areas targeted by this assay, and inadequate number of viral copies (<250 copies / mL). A negative result must be combined with clinical observations, patient history, and epidemiological information.  Fact Sheet for Patients:   StrictlyIdeas.no  Fact Sheet for Healthcare Providers: BankingDealers.co.za  This test is not yet approved or  cleared by the [place] FDA and has been authorized for detection and/or diagnosis of SARS-CoV-2 by FDA under an Emergency Use Authorization (EUA).  This EUA will remain in effect (meaning this test can be used) for the duration of the COVID-19 declaration under Section 564(b)(1) of the Act, 21 U.S.C. section 360bbb-3(b)(1), unless the authorization is terminated or revoked sooner.  Performed at [facility], [place] [address], [place], [place] [address]  Radiology Studies: No results  found.  Scheduled Meds: . sodium chloride   Intravenous Once  . Chlorhexidine Gluconate Cloth  6 each Topical Q0600  . cinacalcet  90 mg Oral Q supper  . darbepoetin (ARANESP) injection - DIALYSIS  100 mcg Intravenous Q Mon-HD  . gabapentin  100 mg Oral BID  . latanoprost  1 drop Both Eyes QHS  . pantoprazole (PROTONIX) IV  40 mg Intravenous Q12H  . sevelamer carbonate  1,600 mg Oral TID WC  . sodium chloride flush  3 mL Intravenous Q12H  . sodium chloride flush  3 mL Intravenous Q12H  . sodium zirconium cyclosilicate  10 g Oral Daily   Continuous Infusions: . sodium chloride    . ferumoxytol       LOS: 4 days   Marylu Lund, MD [facility] Pager On Amion  If 7PM-7AM, please contact night-coverage 08/04/2019, 6:44 PM

## 2019-08-04 NOTE — Progress Notes (Signed)
Patient back to room 4E03 from endo. Vital signs obtained. Alert and oriented to room and call light. Call bell within reach.  Paulene Floor, RN

## 2019-08-04 NOTE — Progress Notes (Signed)
Progress Note   Subjective  Patient feeling okay, no pains. He thinks stool is now brown overnight, but Hgb has drifted to 7.5. He is hoping to avoid all blood products given his religious beliefs.   Objective   Vital signs in last 24 hours: Temp:  [98.2 F (36.8 C)-98.9 F (37.2 C)] 98.3 F (36.8 C) (06/22 0759) Pulse Rate:  [77-103] 87 (06/22 0759) Resp:  [10-20] 17 (06/22 0759) BP: (100-130)/(44-85) 113/62 (06/22 0759) SpO2:  [96 %-100 %] 100 % (06/22 0759) Weight:  [93.6 kg-94.6 kg] 94.6 kg (06/22 0410) Last BM Date: 08/03/19 General:   AA male in NAD Heart:  Regular rate and rhythm Lungs: Respirations even and unlabored, lungs CTA bilaterally Abdomen:  Soft, nontender and nondistended.  Extremities:  Without edema. Neurologic:  Alert and oriented,  grossly normal neurologically. Psych:  Cooperative. Normal mood and affect.  Intake/Output from previous day: 06/21 0701 - 06/22 0700 In: -  Out: 1000  Intake/Output this shift: No intake/output data recorded.  Lab Results: Recent Labs    08/02/19 0726 08/02/19 0726 08/03/19 0359 08/03/19 1548 08/04/19 0314  WBC 6.1  --  5.9  --  5.1  HGB 8.5*   < > 8.0* 7.8* 7.5*  HCT 27.0*  --  24.5*  --  22.7*  PLT 218  --  203  --  187   < > = values in this interval not displayed.   BMET Recent Labs    08/02/19 0726 08/02/19 0726 08/02/19 1418 08/03/19 0359 08/04/19 0314  NA 133*  --   --  133* 134*  K 6.4*   < > 5.1 4.8 3.7  CL 96*  --   --  96* 95*  CO2 22  --   --  21* 26  GLUCOSE 77  --   --  91 87  BUN 92*  --   --  102* 48*  CREATININE 9.82*  --   --  11.65* 8.50*  CALCIUM 9.6  --   --  8.5* 8.5*   < > = values in this interval not displayed.   LFT No results for input(s): PROT, ALBUMIN, AST, ALT, ALKPHOS, BILITOT, BILIDIR, IBILI in the last 72 hours. PT/INR No results for input(s): LABPROT, INR in the last 72 hours.  Studies/Results: No results found.     Assessment / Plan:   59 y/o male  with ESRD on HD, Jehovah's witness, presented with melena / hypotension, EGD showed clean based gastric ulcer on 08/01/19, no endoscopic therapy performed. Hgb has continued to drift down since the procedure unfortunately, now at 7.5. He was having dark stools up until last night, he thinks now brown. Not able to trend BUN given ESRD.   Hopefully he has stopped bleeding, the ulcer did not have any high risk stigmata for bleeding and stools are improved but Hgb has not stabilized yet. If it continues to downtrend he does not want a blood transfusion, I discussed cardiovascular risks of declining blood transfusion should his Hgb get to a level where he would warrant it. I discussed options with him. His Hgb is stable at this time where he should be able to tolerate repeat EGD to look at the ulcer and ensure no high risk stigmata or evidence of rebleeding, and offered that to him given the downtrending Hgb. His other option would be continued PPI and monitoring however if his Hgb continues to downtrend over the next 24-48 hours, he would be  higher risk for anesthesia without a transfusion. After discussion of options, he wanted to proceed with EGD today to reassess the ulcer. Please continue PPI and keep NPO, anticipate this will be done later this afternoon pending anesthesia availability.   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   Recommend: - NPO for now - repeat EGD later today - continue IV protonix - would give dose of IV iron today given he wishes to avoid transfusion if it comes to that - monitor for recurrent bleeding  Call with questions.  [place] Cellar, MD [facility]

## 2019-08-05 LAB — BASIC METABOLIC PANEL
Anion gap: 15 (ref 5–15)
BUN: 56 mg/dL — ABNORMAL HIGH (ref 6–20)
CO2: 25 mmol/L (ref 22–32)
Calcium: 8.7 mg/dL — ABNORMAL LOW (ref 8.9–10.3)
Chloride: 93 mmol/L — ABNORMAL LOW (ref 98–111)
Creatinine, Ser: 11.19 mg/dL — ABNORMAL HIGH (ref 0.61–1.24)
GFR calc Af Amer: 5 mL/min — ABNORMAL LOW (ref >60)
GFR calc non Af Amer: 4 mL/min — ABNORMAL LOW (ref >60)
Glucose, Bld: 113 mg/dL — ABNORMAL HIGH (ref 70–99)
Potassium: 3.9 mmol/L (ref 3.5–5.1)
Sodium: 133 mmol/L — ABNORMAL LOW (ref 135–145)

## 2019-08-05 LAB — CBC
HCT: 26.7 % — ABNORMAL LOW (ref 39.0–52.0)
Hemoglobin: 8.6 g/dL — ABNORMAL LOW (ref 13.0–17.0)
MCH: 28.9 pg (ref 26.0–34.0)
MCHC: 32.2 g/dL (ref 30.0–36.0)
MCV: 89.6 fL (ref 80.0–100.0)
Platelets: 216 10*3/uL (ref 150–400)
RBC: 2.98 MIL/uL — ABNORMAL LOW (ref 4.22–5.81)
RDW: 16.4 % — ABNORMAL HIGH (ref 11.5–15.5)
WBC: 4.5 10*3/uL (ref 4.0–10.5)
nRBC: 0 % (ref 0.0–0.2)

## 2019-08-05 MED ORDER — ASPIRIN EC 81 MG PO TBEC: 81.0000 mg | DELAYED_RELEASE_TABLET | Freq: Every day | ORAL | 0 refills | Status: DC

## 2019-08-05 MED ORDER — OMEPRAZOLE 40 MG PO CPDR: 40.0000 mg | DELAYED_RELEASE_CAPSULE | Freq: Two times a day (BID) | ORAL | 0 refills | Status: DC

## 2019-08-05 NOTE — Procedures (Signed)
Patient was seen on dialysis and the procedure was supervised.  BFR 350  Via AVF BP is  132/66.   Patient appears to be tolerating treatment well  [name] Meckel 08/05/2019

## 2019-08-05 NOTE — Progress Notes (Signed)
      Progress Note   Subjective  Patient doing well. Hgb up to mid 8s this AM. He is tolerating diet, passing brown stool, no bleeding symptoms, recovered from EGD.   Objective   Vital signs in last 24 hours: Temp:  [97.7 F (36.5 C)-98.6 F (37 C)] 97.8 F (36.6 C) (06/23 0352) Pulse Rate:  [75-100] 79 (06/23 0352) Resp:  [11-22] 12 (06/23 0352) BP: (83-141)/(50-88) 136/88 (06/23 0352) SpO2:  [95 %-100 %] 95 % (06/23 0037) Weight:  [94.6 kg-96 kg] 96 kg (06/23 0352) Last BM Date: 08/02/20 General:    AA male in NAD Abdomen:  Soft, nontender  Extremities:  Without edema. Neurologic:  Alert and oriented,  grossly normal neurologically. Psych:  Cooperative. Normal mood and affect.  Intake/Output from previous day: 06/22 0701 - 06/23 0700 In: 100 [I.V.:100] Out: -  Intake/Output this shift: No intake/output data recorded.  Lab Results: Recent Labs    08/03/19 0359 08/03/19 0359 08/03/19 1548 08/04/19 0314 08/05/19 0217  WBC 5.9  --   --  5.1 4.5  HGB 8.0*   < > 7.8* 7.5* 8.6*  HCT 24.5*  --   --  22.7* 26.7*  PLT 203  --   --  187 216   < > = values in this interval not displayed.   BMET Recent Labs    08/03/19 0359 08/04/19 0314 08/05/19 0217  NA 133* 134* 133*  K 4.8 3.7 3.9  CL 96* 95* 93*  CO2 21* 26 25  GLUCOSE 91 87 113*  BUN 102* 48* 56*  CREATININE 11.65* 8.50* 11.19*  CALCIUM 8.5* 8.5* 8.7*   LFT No results for input(s): PROT, ALBUMIN, AST, ALT, ALKPHOS, BILITOT, BILIDIR, IBILI in the last 72 hours. PT/INR No results for input(s): LABPROT, INR in the last 72 hours.  Studies/Results: No results found.     Assessment / Plan:    59 y/o male with ESRD on HD, recent NSAID use, admitted with upper GI bleed, found to have clean based gastric ulcer on EGD 08/01/19, no endoscopic therapy performed at the time. Hgb drifted over the past 48 hours, patient is Jehovah's witnessed and declined blood transfusion. Repeat EGD done yesterday to ensure no  high risk stigmata and no bleeding, and there was not. Prior biopsies benign and ruled out H pylori, ulcer likely due to NSAIDs. His stools have cleared, now brown, suspect he passed old blood in recent days. Hgb stable this AM. I think stable for discharge later today.   Recommend: - continue diet - continue oral protonix 40mg  BID for 1 month upon discharge, then once daily thereafter - avoid all NSAIDs other than baby aspirin for CVA prevention as below - okay to resume baby aspirin in 4-5 days or so (history of CVA). Would normally resume a bit sooner but given he is jehovah's witness, want to minimize risk of rebleeding given his course in recent days - we will coordinate outpatient follow up with [place] - he will need repeat EGD to ensure mucosal healing of ulcer as well as surveillance colonoscopy, primary GI Dr. Hilarie Fredrickson.   Will sign off, call with questions.  [place] Cellar, MD [facility]

## 2019-08-05 NOTE — Progress Notes (Addendum)
[facility] Progress Note   Subjective:   Pt seen on HD. Had EGD yesterday which showed healing ulcer. Hgb stable to improved this AM at 8.6. Pt feeling well and hoping to go home today. Denies SOB, CP, dizziness, abdominal pain, N/V/D.   Objective Vitals:   08/05/19 0041 08/05/19 0042 08/05/19 0044 08/05/19 0352  BP: 95/74 129/79 130/75 136/88  Pulse:  100  79  Resp: 16 12 12 12   Temp:    97.8 F (36.6 C)  TempSrc:    Oral  SpO2:      Weight:    96 kg  Height:       Physical Exam General: Well developed male, alert and in NAD Heart: RRR, no murmurs, rubs or gallops Lungs: CTA bilaterally without wheezing, rhonchi or rales Abdomen: Soft, non-tender, non-distended. +BS Extremities: No edema b/l lower extremities Dialysis Access:  RUE AVF currently accessed   Additional Objective Labs: Basic Metabolic Panel: Recent Labs  Lab 08/03/19 0359 08/04/19 0314 08/05/19 0217  NA 133* 134* 133*  K 4.8 3.7 3.9  CL 96* 95* 93*  CO2 21* 26 25  GLUCOSE 91 87 113*  BUN 102* 48* 56*  CREATININE 11.65* 8.50* 11.19*  CALCIUM 8.5* 8.5* 8.7*   Liver Function Tests: Recent Labs  Lab 07/31/19 1932  AST 19  ALT 15  ALKPHOS 60  BILITOT 0.5  PROT 8.2*  ALBUMIN 3.2*   Recent Labs  Lab 07/31/19 1932  LIPASE 70*   CBC: Recent Labs  Lab 07/31/19 1932 08/01/19 0304 08/02/19 0726 08/02/19 0726 08/03/19 0359 08/03/19 0359 08/03/19 1548 08/04/19 0314 08/05/19 0217  WBC 6.9  --  6.1   < > 5.9  --   --  5.1 4.5  HGB 10.8*   < > 8.5*   < > 8.0*   < > 7.8* 7.5* 8.6*  HCT 35.6*   < > 27.0*   < > 24.5*  --   --  22.7* 26.7*  MCV 93.0  --  89.7  --  88.4  --   --  88.7 89.6  PLT 251  --  218   < > 203  --   --  187 216   < > = values in this interval not displayed.   Blood Culture    Component Value Date/Time   SDES BLOOD LEFT HAND 08/01/2015 0624   SPECREQUEST BOTTLES DRAWN AEROBIC AND ANAEROBIC  5CC 08/01/2015 0624   CULT NO GROWTH 5 DAYS 08/01/2015 0624    REPTSTATUS 08/06/2015 FINAL 08/01/2015 0624    Studies/Results: No results found. Medications: . sodium chloride     . sodium chloride   Intravenous Once  . Chlorhexidine Gluconate Cloth  6 each Topical Q0600  . cinacalcet  90 mg Oral Q supper  . darbepoetin (ARANESP) injection - DIALYSIS  100 mcg Intravenous Q Mon-HD  . gabapentin  100 mg Oral BID  . latanoprost  1 drop Both Eyes QHS  . pantoprazole (PROTONIX) IV  40 mg Intravenous Q12H  . sevelamer carbonate  1,600 mg Oral TID WC  . sodium chloride flush  3 mL Intravenous Q12H  . sodium chloride flush  3 mL Intravenous Q12H  . sodium zirconium cyclosilicate  10 g Oral Daily    Dialysis Orders: [facility] MWF 4h 51min 450/800 EDW 91kg 2K/2Ca AVF Hep 3000 Hectorol 10 Sensipar 90 Fosrenol 1000 TID+ Renvela 6 tid Mircera 75 q 4 weeks (last dose 50 on 6/9)  Assessment/Plan: 1. GI Bleed/Melena. GI  consulted s/p EGD with gastritis and gastric polyp.Biopsy results pending. Repeat EGD on 6/22 for downtrending Hgb showed healing ulcer. Recommending protonix BID. Hgb stable, pt hoping to dc today. Will need yet another EGD as OP- anticipate discharge today  2. Hyperkalemia. K+ 6.4on admission and treated with lokelma, now resolved.Chronic issue. Lokelma 10 daily on outpatient med list but reports he was unable to take lokelma over the weekend (does have med at home). Resolved- K+ 3.9. 3. ESRD -HD MWF, tolerating well. Next HD 6/25. No heparin with HD.  4. Hypertension/volume - Hypotensive on admission, s/p 1L IVF bolus, BP now improved. No gross overload on exam. Minimal UF on HD 5. Anemia - Due to #1, now stable. Received aranesp 142mcg with HD on 6/21. GI ordered a dose of feraheme 6/22. Pt would like to avoid blood transfusions due to religious beliefs.  6. Metabolic bone disease -Calcium at goal.Continue Sensipar/binders 7. Nutrition -Renal diet/vitamins  [name] Paganini, PA-C 08/05/2019, 9:07 AM  [facility] Pager: [phone]  Patient seen and examined, agree with above note with above modifications. Seen on HD -  Doing well-  hgb stable to improved-  Pretty sure he is going home today  Corliss Parish, MD 08/05/2019

## 2019-08-05 NOTE — Discharge Instructions (Signed)

## 2019-08-05 NOTE — Discharge Summary (Signed)
Physician Discharge Summary  Carl Brathwaite Sr. FYB:[id number] DOB: 08-07-1960 DOA: 07/31/2019  PCP: [name], MD  Admit date: 07/31/2019 Discharge date: 08/05/2019  Admitted From: Home Disposition: Home  Recommendations for Outpatient Follow-up:  1. Follow up with PCP in 1-2 weeks 2. Follow with GI as outpatient per the recommended time 3. Follow-up outpatient for regular dialysis 4. Please obtain BMP/CBC in one week 5. Please follow up with your PCP on the following pending results: Unresulted Labs (From admission, onward) Comment          Start     Ordered   07/31/19 1954  Urinalysis, Routine w reflex microscopic  (ED Abdominal Pain)  ONCE - STAT,   STAT        07/31/19 1955           Home Health: None Equipment/Devices: None  Discharge Condition: Stable CODE STATUS: Full code Diet recommendation: Renal  Subjective: Seen and examined in dialysis.  He has no complaints.  Wants to go home.  Brief/Interim Summary: 59 y.o.malewith medical history significant forESRD on hemodialysis, history of CVA presented to emergency department for evaluation of dark tarry stools and low blood pressure. He had approximately 5 episodes of dark tarry stool and had some abdominal cramping and discomfort, saw his PCP for evaluation of this, was found to be hypotensive and directed to the ED. Patient denied any nausea or vomiting,  fevers or chills. He reported occasional ibuprofen use, last used 2 or 3 days ago. He takes aspirin 81 mg every other day and uses omeprazole on an as-needed basis.   Upon arrival to the ED, patient is found to be afebrile, saturating well on room air, blood pressure 85/56, and heart rate 102. EKG features a sinus rhythm. CT the abdomen pelvis notable for diffuse diverticular disease with subtle inflammatory changes at the rectosigmoid colon which could reflect mild acute diverticulitis. Chemistry panel features a normal potassium, normal bicarbonate, and  BUN 55. CBC notable for hemoglobin of 10.8, down from 11.5 in April 2021. Type and screen was performed in the ED, 1 L of saline was administered, and the patient was given a dose of Zosyn. COVID-19 screening test negative.  Patient refused rectal exam. He was admitted under hospital service for presumed upper GI bleed and presumed diverticulitis for which GI was consulted and he was continued on Zosyn.  Subsequently his abdominal pain improved within 24 to 48 hours so it was thought that he probably did not have diverticulitis so antibiotics were discontinued after 2 days.  He ended up having EGD and was found to have superficial erythematous mucosa in gastric body, gastric ulcer and pyloric channel with no stigmata of bleeding and seemed to be healing on PPI.  No active bleeding noted.  Patient's hemoglobin was monitored daily and it dropped to 7.5 the lowest on 08/04/2019 and improved today to 8.6.  Due to his religious believes, he was not transfused at all.  He was seen by GI and cleared to go home with instructions to continue PPI 40 mg p.o. twice daily for a month and then once daily.  He will be discharged today after dialysis.  They have recommended to resume his aspirin 4 days from now and I have strongly advised him to avoid all kinds of NSAIDs.  Discharge Diagnoses:  Principal Problem:   Acute GI bleeding Active Problems:   ESRD on dialysis [facility])   History of completed stroke   Refusal of blood transfusions as patient is [employer]  Witness   Seizures ([place])   Hypotension   Diverticulitis large intestine   Acute gastric ulcer with hemorrhage    Discharge Instructions   Allergies as of 08/05/2019      Reactions   Infed [iron Dextran] Other (See Comments)   Decreased BP    Oxycodone Nausea Only   Vicodin [hydrocodone-acetaminophen] Other (See Comments)   Decreased BP   Whole Blood Other (See Comments)   Patient refuses whole blood "unless I am going to die"   Diclofenac Other  (See Comments)   Unknown      Medication List    STOP taking these medications   ibuprofen 800 MG tablet Commonly known as: ADVIL     TAKE these medications   aspirin EC 81 MG tablet Take 1 tablet (81 mg total) by mouth daily. Start taking on: August 09, 2019 What changed: These instructions start on August 09, 2019. If you are unsure what to do until then, ask your doctor or other care provider.   atorvastatin 10 MG tablet Commonly known as: LIPITOR Take 1 tablet (10 mg total) by mouth daily at 6 PM.   bimatoprost 0.03 % ophthalmic solution Commonly known as: LUMIGAN Place 1 drop into both eyes daily.   cholecalciferol 25 MCG (1000 UNIT) tablet Commonly known as: VITAMIN D3 Take 1,000 Units by mouth daily.   gabapentin 100 MG capsule Commonly known as: NEURONTIN Take 100 mg by mouth 2 (two) times daily.   Lokelma 10 g Pack packet Generic drug: sodium zirconium cyclosilicate Take 1 packet by mouth daily.   multivitamin Tabs tablet Take 1 tablet by mouth daily.   omeprazole 40 MG capsule Commonly known as: PRILOSEC Take 1 capsule (40 mg total) by mouth in the morning and at bedtime. What changed:   medication strength  how much to take  when to take this  reasons to take this   Sensipar 90 MG tablet Generic drug: cinacalcet Take 90 mg by mouth daily.   sevelamer carbonate 800 MG tablet Commonly known as: RENVELA Take 1,600 mg by mouth 3 (three) times daily with meals.   topiramate 25 MG tablet Commonly known as: TOPAMAX Take 1 tablet (25 mg total) by mouth 2 (two) times daily. On HD days, take morning dose after HD treatment   traMADol 50 MG tablet Commonly known as: ULTRAM Take 1-2 tablets (50-100 mg total) by mouth every 6 (six) hours as needed.   TURMERIC PO Take 1 tablet by mouth daily.   vitamin E 180 MG (400 UNITS) capsule Take 400 Units by mouth daily.       Follow-up Information    Levin Erp, PA Follow up on 09/01/2019.    Specialty: Gastroenterology Why: 11 AM follow up w GI PA (for Dr Hilarie Fredrickson).  follow up stomach ulcer.   Contact information: [place] [address] [phone]        Koirala, Dibas, MD Follow up in 1 week(s).   Specialty: Family Medicine Contact information: [address] [place] [address] [phone]              Allergies  Allergen Reactions  . Infed [Iron Dextran] Other (See Comments)    Decreased BP   . Oxycodone Nausea Only  . Vicodin [Hydrocodone-Acetaminophen] Other (See Comments)    Decreased BP  . Whole Blood Other (See Comments)    Patient refuses whole blood "unless I am going to die"  . Diclofenac Other (  See Comments)    Unknown    Consultations: GI   Procedures/Studies: CT Abdomen Pelvis W Contrast  Result Date: 07/31/2019 CLINICAL DATA:  Abdomen pain EXAM: CT ABDOMEN AND PELVIS WITH CONTRAST TECHNIQUE: Multidetector CT imaging of the abdomen and pelvis was performed using the standard protocol following bolus administration of intravenous contrast. CONTRAST:  144mL OMNIPAQUE IOHEXOL 300 MG/ML  SOLN COMPARISON:  01/10/2017 FINDINGS: Lower chest: Lung bases demonstrate no acute consolidation or effusion. Hepatobiliary: Gallbladder is dilated. No calcified stone. No biliary dilatation Pancreas: Unremarkable. No pancreatic ductal dilatation or surrounding inflammatory changes. Spleen: Normal in size without focal abnormality. Adrenals/Urinary Tract: Adrenal glands are normal. Interval bilateral nephrectomy. The bladder is empty Stomach/Bowel: The stomach is nonenlarged. No dilated small bowel. Status post right hemicolectomy with ileocolic anastomosis in the right mid abdomen. Diffuse diverticular disease of the colon. Minimal inflammatory change at the rectosigmoid colon best seen on coronal views, series 6, image number 94, likely due to acute diverticulitis. Vascular/Lymphatic: Mild to moderate aortic  atherosclerosis. No aneurysm. No suspicious adenopathy Reproductive: Prostate is unremarkable. Other: Negative for free air or free fluid. Small fat containing supraumbilical ventral hernia Musculoskeletal: Mild diffuse sclerosis likely due to chronic kidney disease. Advanced degenerative changes of both hips. Large lucent lesions/probable geodes within the left acetabulum and proximal right femur. IMPRESSION: 1. Diffuse diverticular disease of the colon. Subtle inflammatory changes at the rectosigmoid colon, likely due to mild acute diverticulitis. No perforation or abscess. 2. Interval bilateral nephrectomy 3. Distended gallbladder without calcified stone or surrounding inflammation Electronically Signed   By: Donavan Foil M.D.   On: 07/31/2019 22:46      Discharge Exam: Vitals:   08/05/19 0900 08/05/19 0930  BP: 117/71 (P) 132/66  Pulse: 91 (P) 96  Resp: 12 (P) 15  Temp:    SpO2:     Vitals:   08/05/19 0800 08/05/19 0830 08/05/19 0900 08/05/19 0930  BP: (!) 138/57 134/71 117/71 (P) 132/66  Pulse: 72 86 91 (P) 96  Resp: 16 16 12  (P) 15  Temp:      TempSrc:      SpO2:      Weight:      Height:        General: Pt is alert, awake, not in acute distress Cardiovascular: RRR, S1/S2 +, no rubs, no gallops Respiratory: CTA bilaterally, no wheezing, no rhonchi Abdominal: Soft, NT, ND, bowel sounds + Extremities: no edema, no cyanosis    The results of significant diagnostics from this hospitalization (including imaging, microbiology, ancillary and laboratory) are listed below for reference.     Microbiology: Recent Results (from the past 240 hour(s))  SARS Coronavirus 2 by RT PCR (hospital order, performed in [facility] hospital lab) Nasopharyngeal Nasopharyngeal Swab     Status: None   Collection Time: 08/01/19 12:01 AM   Specimen: Nasopharyngeal Swab  Result Value Ref Range Status   SARS Coronavirus 2 NEGATIVE NEGATIVE Final    Comment: (NOTE) SARS-CoV-2 target nucleic acids  are NOT DETECTED.  The SARS-CoV-2 RNA is generally detectable in upper and lower respiratory specimens during the acute phase of infection. The lowest concentration of SARS-CoV-2 viral copies this assay can detect is 250 copies / mL. A negative result does not preclude SARS-CoV-2 infection and should not be used as the sole basis for treatment or other patient management decisions.  A negative result may occur with improper specimen collection / handling, submission of specimen other than nasopharyngeal swab, presence of viral mutation(s) within the  areas targeted by this assay, and inadequate number of viral copies (<250 copies / mL). A negative result must be combined with clinical observations, patient history, and epidemiological information.  Fact Sheet for Patients:   StrictlyIdeas.no  Fact Sheet for Healthcare Providers: BankingDealers.co.za  This test is not yet approved or  cleared by the [place] FDA and has been authorized for detection and/or diagnosis of SARS-CoV-2 by FDA under an Emergency Use Authorization (EUA).  This EUA will remain in effect (meaning this test can be used) for the duration of the COVID-19 declaration under Section 564(b)(1) of the Act, 21 U.S.C. section 360bbb-3(b)(1), unless the authorization is terminated or revoked sooner.  Performed at [facility], [place] [address] [place], [place] [address]      Labs: BNP (last 3 results) No results for input(s): BNP in the last 8760 hours. Basic Metabolic Panel: Recent Labs  Lab 08/01/19 0304 08/01/19 0304 08/02/19 0726 08/02/19 1418 08/03/19 0359 08/04/19 0314 08/05/19 0217  NA 136  --  133*  --  133* 134* 133*  K 4.7   < > 6.4* 5.1 4.8 3.7 3.9  CL 97*  --  96*  --  96* 95* 93*  CO2 25  --  22  --  21* 26 25  GLUCOSE 78  --  77  --  91 87 113*  BUN 72*  --  92*  --  102* 48* 56*  CREATININE 7.04*  --  9.82*  --  11.65* 8.50* 11.19*   CALCIUM 9.2  --  9.6  --  8.5* 8.5* 8.7*   < > = values in this interval not displayed.   Liver Function Tests: Recent Labs  Lab 07/31/19 1932  AST 19  ALT 15  ALKPHOS 60  BILITOT 0.5  PROT 8.2*  ALBUMIN 3.2*   Recent Labs  Lab 07/31/19 1932  LIPASE 70*   No results for input(s): AMMONIA in the last 168 hours. CBC: Recent Labs  Lab 07/31/19 1932 07/31/19 1932 08/01/19 0304 08/01/19 0304 08/02/19 0726 08/03/19 0359 08/03/19 1548 08/04/19 0314 08/05/19 0217  WBC 6.9  --   --   --  6.1 5.9  --  5.1 4.5  HGB 10.8*   < > 9.7*   < > 8.5* 8.0* 7.8* 7.5* 8.6*  HCT 35.6*   < > 30.6*  --  27.0* 24.5*  --  22.7* 26.7*  MCV 93.0  --   --   --  89.7 88.4  --  88.7 89.6  PLT 251  --   --   --  218 203  --  187 216   < > = values in this interval not displayed.   Cardiac Enzymes: No results for input(s): CKTOTAL, CKMB, CKMBINDEX, TROPONINI in the last 168 hours. BNP: Invalid input(s): POCBNP CBG: No results for input(s): GLUCAP in the last 168 hours. D-Dimer No results for input(s): DDIMER in the last 72 hours. Hgb A1c No results for input(s): HGBA1C in the last 72 hours. Lipid Profile No results for input(s): CHOL, HDL, LDLCALC, TRIG, CHOLHDL, LDLDIRECT in the last 72 hours. Thyroid function studies No results for input(s): TSH, T4TOTAL, T3FREE, THYROIDAB in the last 72 hours.  Invalid input(s): FREET3 Anemia work up No results for input(s): VITAMINB12, FOLATE, FERRITIN, TIBC, IRON, RETICCTPCT in the last 72 hours. Urinalysis    Component Value Date/Time   COLORURINE YELLOW 08/06/2006 0806   APPEARANCEUR CLEAR 08/06/2006 0806   LABSPEC 1.013 08/06/2006 6333  PHURINE 8.0 08/06/2006 0806   GLUCOSEU 100 (A) 08/06/2006 0806   HGBUR MODERATE (A) 08/06/2006 0806   BILIRUBINUR NEGATIVE 08/06/2006 0806   KETONESUR NEGATIVE 08/06/2006 0806   PROTEINUR > 300 08/06/2006 0806   UROBILINOGEN 0.2 08/06/2006 0806   NITRITE NEGATIVE 08/06/2006 0806   LEUKOCYTESUR SMALL  08/06/2006 0806   Sepsis Labs Invalid input(s): PROCALCITONIN,  WBC,  LACTICIDVEN Microbiology Recent Results (from the past 240 hour(s))  SARS Coronavirus 2 by RT PCR (hospital order, performed in [facility] hospital lab) Nasopharyngeal Nasopharyngeal Swab     Status: None   Collection Time: 08/01/19 12:01 AM   Specimen: Nasopharyngeal Swab  Result Value Ref Range Status   SARS Coronavirus 2 NEGATIVE NEGATIVE Final    Comment: (NOTE) SARS-CoV-2 target nucleic acids are NOT DETECTED.  The SARS-CoV-2 RNA is generally detectable in upper and lower respiratory specimens during the acute phase of infection. The lowest concentration of SARS-CoV-2 viral copies this assay can detect is 250 copies / mL. A negative result does not preclude SARS-CoV-2 infection and should not be used as the sole basis for treatment or other patient management decisions.  A negative result may occur with improper specimen collection / handling, submission of specimen other than nasopharyngeal swab, presence of viral mutation(s) within the areas targeted by this assay, and inadequate number of viral copies (<250 copies / mL). A negative result must be combined with clinical observations, patient history, and epidemiological information.  Fact Sheet for Patients:   StrictlyIdeas.no  Fact Sheet for Healthcare Providers: BankingDealers.co.za  This test is not yet approved or  cleared by the [place] FDA and has been authorized for detection and/or diagnosis of SARS-CoV-2 by FDA under an Emergency Use Authorization (EUA).  This EUA will remain in effect (meaning this test can be used) for the duration of the COVID-19 declaration under Section 564(b)(1) of the Act, 21 U.S.C. section 360bbb-3(b)(1), unless the authorization is terminated or revoked sooner.  Performed at [facility], [place] [address] [place], [place] [address]      Time coordinating  discharge: Over 30 minutes  SIGNED:   Darliss Cheney, MD  [facility] 08/05/2019, 10:16 AM  If 7PM-7AM, please contact night-coverage [email or web address]

## 2019-08-05 NOTE — Care Management Important Message (Signed)
Important Message  Patient Details  Name: Carl Viloria Sr. MRN: [id number] Date of Birth: [birth year]   Medicare Important Message Given:  Yes     [name] 08/05/2019, 3:45 PM

## 2019-08-05 NOTE — Care Management Important Message (Signed)
Important Message  Patient Details  Name: Carl Mascio Sr. MRN: [id number] Date of Birth: [birth year]   Medicare Important Message Given:  Yes Patient left prior to IM delivery.  IM mailed to the patient address.     [name] 08/05/2019, 3:41 PM

## 2019-08-05 NOTE — Progress Notes (Signed)
D/C instructions given to patient. Medications reviewed. All questions answered. IV removed. Family to escort pt home  [name], [place]

## 2019-08-06 ENCOUNTER — Telehealth: Payer: Self-pay | Admitting: Physician Assistant

## 2019-08-06 ENCOUNTER — Encounter (HOSPITAL_COMMUNITY): Payer: Self-pay | Admitting: Gastroenterology

## 2019-08-06 NOTE — Telephone Encounter (Signed)
Transition of care contact from inpatient facility  Date of Discharge: 08/05/19 Date of Contact: 08/06/19 Method of contact: Phone  Attempted to contact patient to discuss transition of care from inpatient admission. Patient did not answer the phone. Unable to leave VM.  [name] Paganini, PA-C 08/06/2019, 2:04 PM  [facility] Pager: [phone]

## 2019-09-01 ENCOUNTER — Ambulatory Visit: Payer: Medicare Other | Admitting: Physician Assistant

## 2019-09-07 ENCOUNTER — Telehealth: Payer: Self-pay | Admitting: Internal Medicine

## 2019-09-07 NOTE — Telephone Encounter (Signed)
Pt no showed his hos f/u appt from 7/20 with Ellouise Newer. Pt last saw Dr Hilarie Fredrickson in 2017 but Dr Havery Moros did surgery on 08/04/2019. Pt would like to reschedule to be seen asap, no appts available.

## 2019-09-07 NOTE — Telephone Encounter (Signed)
Patient has been scheduled for Tye Savoy RNP on 09/09/19 11:00.  He confirmed understanding of appt date and time.

## 2019-09-09 ENCOUNTER — Ambulatory Visit (INDEPENDENT_AMBULATORY_CARE_PROVIDER_SITE_OTHER): Payer: Medicare Other | Admitting: Nurse Practitioner

## 2019-09-09 ENCOUNTER — Encounter: Payer: Self-pay | Admitting: Nurse Practitioner

## 2019-09-09 VITALS — BP 120/64 | HR 92 | Ht 73.0 in | Wt 204.0 lb

## 2019-09-09 DIAGNOSIS — K279 Peptic ulcer, site unspecified, unspecified as acute or chronic, without hemorrhage or perforation: Secondary | ICD-10-CM

## 2019-09-09 DIAGNOSIS — K6389 Other specified diseases of intestine: Secondary | ICD-10-CM

## 2019-09-09 MED ORDER — SUTAB 1479-225-188 MG PO TABS
1.0000 | ORAL_TABLET | Freq: Once | ORAL | 0 refills | Status: AC
Start: 2019-09-09 — End: 2019-09-09

## 2019-09-09 MED ORDER — OMEPRAZOLE 40 MG PO CPDR: 40.0000 mg | DELAYED_RELEASE_CAPSULE | Freq: Every day | ORAL | 1 refills | Status: DC

## 2019-09-09 NOTE — Patient Instructions (Addendum)
If you are age 61 or older, your body mass index should be between 23-30. Your Body mass index is 26.91 kg/m. If this is out of the aforementioned range listed, please consider follow up with your Primary Care Provider.  If you are age 54 or younger, your body mass index should be between 19-25. Your Body mass index is 26.91 kg/m. If this is out of the aformentioned range listed, please consider follow up with your Primary Care Provider.   We have sent the following medications to your pharmacy for you to pick up at your convenience:  Omeprazole 40 mg in the morning Sutab  Due to recent changes in healthcare laws, you may see the results of your imaging and laboratory studies on MyChart before your provider has had a chance to review them.  We understand that in some cases there may be results that are confusing or concerning to you. Not all laboratory results come back in the same time frame and the provider may be waiting for multiple results in order to interpret others.  Please give [place] 48 hours in order for your provider to thoroughly review all the results before contacting the office for clarification of your results.

## 2019-09-09 NOTE — Progress Notes (Signed)
IMPRESSION and PLAN:     Carl Betzold Sr. is a 59 y.o. male with a PMH signficant for, but not necessarily limited to, ESRD on HD, hypertension, OSA, CVA, headaches,  GERD, partial colectomy for inflammatory mass  # Upper GI bleed /PUD in setting of NSAIDs June 2021 --Clean based gastric ulcer surrounded by edematous mucosa on inpatient EGD in June. Reactive gastropathy, no H.pylori on biopsies.  --Patient has since discontinued NSAIDs.  --Decrease omeprazole to once daily.  No further bleeding, hemoglobin has improved.  --Schedule for EGD to be done sometime in the next several weeks to document gastric ulcer healing.  He also needs a colonoscopy, this can be done at the same time. The risks and benefits of EGD were discussed and the patient agrees to proceed.   # ABL anemia secondary to above --Hemoglobin improving.  Last checked at dialysis center 09/02/2019 and up from 8.6 to 9.2 --Patient is a Jehovah's Witness, does not receive blood transfusions  # Hx of inflammatory colon mass in 2017, s/p resection --Needs follow-up colonoscopy.  This will be done on the same day as EGD.The risks and benefits of colonoscopy with possible polypectomy / biopsies were discussed and the patient agrees to proceed.   # ESRD on HD M,W and Fri.  --Says he is about to be relisted on transplant list.   HPI:    Primary GI: Carl Jarred, MD   Chief complaint : Hospital follow-up  This patient is a 58 year old male known to Dr. Hilarie [name].  He had a partially obstructing,  inflammatory mass in ascending colon found on colonoscopy in 2017 s/p colon resection.  Surgical path compatible with serositis and fibrous adhesions, ulceration with focal perforation, 39 lymph nodes were negative.  He never had 1 year follow-up colonoscopy as recommended  Patient was hospitalized mid June of this year with melena and anemia.   in the setting of NSAIDs. Nadir hemoglobin 7.5 , down from baseline of 10.8. By time of  discharge hemoglobin was stable at 8.6 without blood transfusion as patient is a Sales promotion account executive Witness.  EGD by Dr. Collene [name] showed a clean-based gastric ulcer , patchy gastritis and duodenitis.  Biopsies compatible with reactive gastropathy, multiple foci of intestinal metaplasia.  No dysplasia, no H. Pylori.  CT scan raised concern for mild diverticulitis but Dr. Collene [name] was skeptical of findings based on exam.    INTERVAL HISTORY:   Patient is here for hospital follow-up.  He has been taking twice daily omeprazole.   Patient denies NSAID use since hospital discharge. No blood in stool.  He feels fine.  Dialysis told him hemoglobin was improving.  He called the Dialysis center while in clinic and hemoglobin on 7/21 was 9.2.   Data Reviewed:   Hgb 10.8 >>>7.8 >>> 8.6.  Hgb at dialysis center on 7/21 was 9.2 ( patient called during visit)   [name] / GI studies: 08/01/19 EGD Transverse folds in the esophagus ?EoE; widely patent esophagus and GEJ; SCJ was measured at 35 cm. - A single 6 mm sessile gastric polyp in the cardia-probably a fundic gland polyp-this was not removed.. - 7-8 mm gastric ulcer in the pre-pyloric region with edematous mucosa around it-biopsies done to rule out H. pylori by pathology. - Moderate patchy gastritis in the stomach. - Chronic duodenitis in the duodenal bulb abd post-bular region.  Biopsies: Reactive gastropathy, multiple foci of intestinal metaplasia.  No H. pylori  Review of systems:  No chest pain, no SOB, no fevers, no urinary sx   Past Medical History:  Diagnosis Date  . Anemia   . Arthritis   . Borderline diabetes   . Chronic headaches   . Diverticulitis   . ESRD (end stage renal disease) ([place])     On Renal Transplant List," [facility]; MWF" (10/23/2016)  . GI bleed   . Hypertension   . Parathyroid abnormality (HCC)    ectopic parathyroid gland  . Presence of arteriovenous fistula for hemodialysis, primary ([place])    RUE  .  Refusal of blood product    NO WHOLE BLOOD PROUCTS  . Secondary hyperparathyroidism ([place])   . Seizures ([place])    one episode in past  . Sleep apnea    doesn't use CPAP anymore since weight loss  . Stroke [facility])    no residual    Patient's surgical history, family medical history, social history, medications and allergies were all reviewed in Epic   Creatinine clearance cannot be calculated (Patient's most recent lab result is older than the maximum 21 days allowed.)  Current Outpatient Medications  Medication Sig Dispense Refill  . aspirin EC 81 MG tablet Take 1 tablet (81 mg total) by mouth daily. 90 tablet 0  . atorvastatin (LIPITOR) 10 MG tablet Take 1 tablet (10 mg total) by mouth daily at 6 PM. 30 tablet 0  . bimatoprost (LUMIGAN) 0.03 % ophthalmic solution Place 1 drop into both eyes daily.    . cinacalcet (SENSIPAR) 60 MG tablet Take 60 mg by mouth in the morning and at bedtime.    . gabapentin (NEURONTIN) 100 MG capsule Take 100 mg by mouth 2 (two) times daily.     Marland Kitchen LOKELMA 10 g PACK packet Take 1 packet by mouth daily.    . multivitamin (RENA-VIT) TABS tablet Take 1 tablet by mouth daily.    . sevelamer carbonate (RENVELA) 800 MG tablet Take 2,400 mg by mouth 3 (three) times daily with meals. 3 tablets with every snack    . topiramate (TOPAMAX) 25 MG tablet Take 25 mg by mouth 2 (two) times daily.    . traMADol (ULTRAM) 50 MG tablet Take 1-2 tablets (50-100 mg total) by mouth every 6 (six) hours as needed. 15 tablet 0  . vitamin E 180 MG (400 UNITS) capsule Take 400 Units by mouth daily.    Marland Kitchen omeprazole (PRILOSEC) 40 MG capsule Take 1 capsule (40 mg total) by mouth in the morning and at bedtime. (Patient not taking: Reported on 09/09/2019) 60 capsule 0   No current facility-administered medications for this visit.    Filed Weights   09/09/19 1109  Weight: (!) 204 lb (92.5 kg)    Physical Exam:     BP (!) 120/64   Pulse 92   Ht 6\' 1"  (1.854 m)   Wt (!) 204 lb (92.5  kg)   BMI 26.91 kg/m   GENERAL:  Pleasant male in NAD PSYCH: : Cooperative, normal affect CARDIAC:  RRR PULM: Normal respiratory effort, lungs CTA bilaterally, no wheezing ABDOMEN:  Nondistended, soft, nontender. No obvious masses, no hepatomegaly,  normal bowel sounds SKIN:  turgor, no lesions seen Musculoskeletal:  Normal muscle tone, normal strength NEURO: Alert and oriented x 3, no focal neurologic deficits   Tye Savoy , NP 09/09/2019, 11:26 AM

## 2019-10-05 NOTE — Progress Notes (Signed)
Addendum: Reviewed and agree with assessment and management plan. Kiaraliz Rafuse M, MD  

## 2019-11-09 ENCOUNTER — Encounter: Payer: Self-pay | Admitting: Certified Registered Nurse Anesthetist

## 2019-11-10 ENCOUNTER — Telehealth: Payer: Self-pay | Admitting: Internal Medicine

## 2019-11-10 ENCOUNTER — Ambulatory Visit (INDEPENDENT_AMBULATORY_CARE_PROVIDER_SITE_OTHER): Payer: Medicare Other | Admitting: Cardiology

## 2019-11-10 ENCOUNTER — Encounter: Payer: Medicare Other | Admitting: Internal Medicine

## 2019-11-10 ENCOUNTER — Other Ambulatory Visit: Payer: Self-pay

## 2019-11-10 ENCOUNTER — Encounter: Payer: Self-pay | Admitting: Cardiology

## 2019-11-10 VITALS — BP 90/62 | HR 104 | Temp 97.7°F | Ht 73.0 in | Wt 210.2 lb

## 2019-11-10 DIAGNOSIS — I953 Hypotension of hemodialysis: Secondary | ICD-10-CM | POA: Diagnosis not present

## 2019-11-10 DIAGNOSIS — Z8673 Personal history of transient ischemic attack (TIA), and cerebral infarction without residual deficits: Secondary | ICD-10-CM | POA: Diagnosis not present

## 2019-11-10 DIAGNOSIS — I358 Other nonrheumatic aortic valve disorders: Secondary | ICD-10-CM | POA: Diagnosis not present

## 2019-11-10 DIAGNOSIS — Z7189 Other specified counseling: Secondary | ICD-10-CM

## 2019-11-10 DIAGNOSIS — N186 End stage renal disease: Secondary | ICD-10-CM | POA: Diagnosis not present

## 2019-11-10 DIAGNOSIS — Z992 Dependence on renal dialysis: Secondary | ICD-10-CM

## 2019-11-10 NOTE — Progress Notes (Signed)
Cardiology Office Note:    Date:  11/10/2019   ID:  Carl Found Sr., DOB 24-Aug-1960, MRN [id number]  PCP:  [name], MD  Cardiologist:  Buford Dresser, MD  Referring MD: [name], MD   CC: new patient evaluation for cardiovascular disease evaluation  History of Present Illness:    Carl Rueb Sr. is a 59 y.o. male with a hx of ESRD on dialysis, carotid artery plaque who is seen as a new consult at the request of Koirala, Dibas, MD for the evaluation and management of cardiovascular management pre-kidney transplant. He has not been seen in the practice since 2016 (Dr. Debara Pickett).  Today: Patient felt he was doing very well. He thought he was going to get a kidney transplant. He was told that there was something with his heart that got him kicked off the transplant list. Per note in Care Everywhere dated 09/30/19, "Called and informed that pt that he would not be a candidate for txp here d/t low BP and abnormal cardiac testing. Told that he could pursue txp with another center if he desired. He was disappointed with the decision but verbalized understanding. Eval closed appts cancelled and letter sent. Electronically signed by: Clinta Nicole Iran, RN "  He is overall frustrated by this. Plans to go to [facility] for transplant evaluation. Has been on HD for 15 years.  Doesn't understand why all of a sudden he was told he is no longer a candidate.  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 This is noted to be seen previously in 2018 and 2020.   Remains very active, doesn't sit still. No chest pain. No physical limitations. Only ongoing issue is dialysis-associated hypotension. Has been recommended to start midodrine 10 mg with dialysis. Plans to try this with next dialysis. He isn't sure what his numbers run, but has heard his systolic will be 21-19 during dialysis. He feels fine with this.  He denies  history of CVA, though it is noted in the chart. On review of his imaging in 2019, had chronic lacunar infarcts and microvascular ischemi changes, but no acute changes from prior imaging. He notes that he was told that temporary symptoms were likely due to high potassium.  Recently admitted for GI bleed. Noted to have pyloric ulcer and duodenitis. He was taking a significant amount of ibuprofen on an empty stomach prior to this due to joint pain. No more dark stools.  Plans to join silver sneakers program to get more exercise. Plans to get back to bike riding as well.   Denies chest pain, shortness of breath at rest or with normal exertion. No PND, orthopnea, LE edema or unexpected weight gain. No syncope recently (had syncope with GI bleed) or palpitations.  Past Medical History:  Diagnosis Date  . Anemia   . Arthritis   . Borderline diabetes   . Chronic headaches   . Diverticulitis   . ESRD (end stage renal disease) ([place])     On Renal Transplant List," [facility]; MWF" (10/23/2016)  . GI bleed   . Hypertension   . Parathyroid abnormality (HCC)    ectopic parathyroid gland  . Presence of arteriovenous fistula for hemodialysis, primary ([place])    RUE  . Refusal of blood product    NO WHOLE BLOOD PROUCTS  . Secondary hyperparathyroidism ([place])   . Seizures ([place])    one episode in past  . Sleep apnea  doesn't use CPAP anymore since weight loss  . Stroke [facility])    no residual    Past Surgical History:  Procedure Laterality Date  . AV FISTULA PLACEMENT Right    right arm  . BIOPSY  08/01/2019   Procedure: BIOPSY;  Surgeon: Juanita Craver, MD;  Location: [place];  Service: Endoscopy;;  . CATARACT EXTRACTION W/ INTRAOCULAR LENS  IMPLANT, BILATERAL    . COLON SURGERY    . COLONOSCOPY N/A 08/04/2015   Procedure: COLONOSCOPY;  Surgeon: Jerene Bears, MD;  Location: [facility] ENDOSCOPY;  Service: Endoscopy;  Laterality: N/A;  . ESOPHAGOGASTRODUODENOSCOPY N/A 08/01/2019   Procedure:  ESOPHAGOGASTRODUODENOSCOPY (EGD);  Surgeon: Juanita Craver, MD;  Location: [facility] ENDOSCOPY;  Service: Endoscopy;  Laterality: N/A;  . ESOPHAGOGASTRODUODENOSCOPY (EGD) WITH PROPOFOL N/A 08/04/2019   Procedure: ESOPHAGOGASTRODUODENOSCOPY (EGD) WITH PROPOFOL;  Surgeon: Yetta Flock, MD;  Location: [place];  Service: Gastroenterology;  Laterality: N/A;  . graft left arm Left    for dialysis x 2  . INSERTION OF DIALYSIS CATHETER     Rt chest  . LAPAROSCOPIC RIGHT COLECTOMY N/A 08/05/2015   Procedure: LAPAROSCOPIC RIGHT COLECTOMY- ASCENDING;  Surgeon: Stark Klein, MD;  Location: [place];  Service: General;  Laterality: N/A;  . MASS EXCISION Left 05/28/2019   Procedure: EXCISION SOFT TISSUE MASS LEFT SHOULDER;  Surgeon: Armandina Gemma, MD;  Location: [facility];  Service: General;  Laterality: Left;  . NEPHRECTOMY Bilateral   . PARATHYROIDECTOMY N/A 06/12/2016   Procedure: TOTAL PARATHYROIDECTOMY WITH AUTOTRANSPLANTATION TO LEFT FOREARM;  Surgeon: Armandina Gemma, MD;  Location: [place];  Service: General;  Laterality: N/A;  . PARATHYROIDECTOMY  10/23/2016  . PARATHYROIDECTOMY N/A 10/23/2016   Procedure: PARATHYROIDECTOMY;  Surgeon: Armandina Gemma, MD;  Location: [place];  Service: General;  Laterality: N/A;  . REVISON OF ARTERIOVENOUS FISTULA Right 07/16/2017   Procedure: REVISION OF ARTERIOVENOUS FISTULA  Right ARM;  Surgeon: Waynetta Sandy, MD;  Location: [place];  Service: Vascular;  Laterality: Right;    Current Medications: Current Outpatient Medications on File Prior to Visit  Medication Sig  . aspirin EC 81 MG tablet Take 1 tablet (81 mg total) by mouth daily.  Marland Kitchen atorvastatin (LIPITOR) 10 MG tablet Take 1 tablet (10 mg total) by mouth daily at 6 PM.  . bimatoprost (LUMIGAN) 0.03 % ophthalmic solution Place 1 drop into both eyes daily.  . cinacalcet (SENSIPAR) 60 MG tablet Take 60 mg by mouth in the morning and at bedtime.  . gabapentin (NEURONTIN) 100 MG capsule Take 100 mg by mouth 2 (two)  times daily.   Marland Kitchen LOKELMA 10 g PACK packet Take 1 packet by mouth daily.  . multivitamin (RENA-VIT) TABS tablet Take 1 tablet by mouth daily.  . sevelamer carbonate (RENVELA) 800 MG tablet Take 2,400 mg by mouth 3 (three) times daily with meals. 3 tablets with every snack  . topiramate (TOPAMAX) 25 MG tablet Take 25 mg by mouth 2 (two) times daily.  . traMADol (ULTRAM) 50 MG tablet Take 1-2 tablets (50-100 mg total) by mouth every 6 (six) hours as needed.  . vitamin E 180 MG (400 UNITS) capsule Take 400 Units by mouth daily.  Marland Kitchen omeprazole (PRILOSEC) 40 MG capsule Take 1 capsule (40 mg total) by mouth daily.   No current facility-administered medications on file prior to visit.     Allergies:   Infed [iron dextran], Oxycodone, Vicodin [hydrocodone-acetaminophen], Whole blood, and Diclofenac   Social History   Tobacco Use  . Smoking status: Former  Smoker    Quit date: 06/19/1990    Years since quitting: 29.4  . Smokeless tobacco: Never Used  . Tobacco comment: rarely cigar  Vaping Use  . Vaping Use: Never used  Substance Use Topics  . Alcohol use: Yes    Alcohol/week: 1.0 standard drink    Types: 1 Cans of beer per week    Comment: occasional  . Drug use: No    Family History: family history includes Anuerysm in his brother; Diabetes in his father; Hypertension in his father and sister; Lupus in his sister; Stroke in his father and sister; Uterine cancer in his mother. There is no history of Colon cancer, Esophageal cancer, Stomach cancer, Pancreatic cancer, or Liver disease.  ROS:   Please see the history of present illness.  Additional pertinent ROS: Constitutional: Negative for chills, fever, night sweats, unintentional weight loss  HENT: Negative for ear pain and hearing loss.   Eyes: Negative for loss of vision and eye pain.  Respiratory: Negative for cough, sputum, wheezing.   Cardiovascular: See HPI. Gastrointestinal: Negative for abdominal pain, melena, and hematochezia.    Genitourinary: Negative for dysuria and hematuria.  Musculoskeletal: Negative for falls and myalgias.  Skin: Negative for itching and rash.  Neurological: Negative for focal weakness, focal sensory changes and loss of consciousness.  Endo/Heme/Allergies: Does not bruise/bleed easily.     EKGs/Labs/Other Studies Reviewed:    The following studies were reviewed today: Echo 09/29/19 (Care Everywhere)  SUMMARY  The left ventricular size is normal.  There is mild concentricleft ventricular hypertrophy.  Left ventricular systolic function is normal.  LV ejection fraction = 55-60%.  No segmental wall motion abnormalities seen in the left ventricle  Left ventricular filling pattern is indeterminate.  The right ventricle is normal in size and function.  The left atrium is mildly to moderately dilated.  The aortic valve is trileaflet and opens well.  There is a 1.5 to 2 cm thin, filamentous, hypermobile echodensity  noted on the ventricular side of the aortic valve, likely representing  lambl's excrescence.  Estimated right ventricular systolic pressure is 20 mmHg.  No pulmonary hypertension.  The aortic sinus is normal size.  Mildly dilated ascending aorta.  IVC size was normal.  There is no pericardial effusion.  Compared to prior study, probably no significant change, filamentous  structure visualized on aortic valve also visible in 2020 and 2018.  Clinical correlation required.   Stress echo 03/25/2018 (Care Everywhere) SUMMARY  The patient had no chest pain during stress  The patient achieved 89 % of maximum predicted heart rate.  Negative stress ECG for inducible ischemia at target heart rate.  Negative dobutamine echocardiography for inducible ischemia at target heart  rate.  -  FINDINGS:  -  ECG REST  The baseline ECG displays normal sinus rhythm.  -  ECG STRESS  No diagnostic ST segment changes were seen. Negative stress ECG for inducible  ischemia at target heart  rate. The stress protocol, dobutamine perfusion  time, ECG changes, blood pressure and heart rate responses are shown in the  Cardiology Scan portion of this report in EPIC.  -  REST ECHO  Normal left ventricular function at rest. Therewere no segmental wall motion  abnormalities at rest. The estimated LV ejection fraction is 55-60% .  -  LOW DOSE  There was normal augmentation of all left ventricular wall segments with  dobutamine. There were no segmental wall motion abnormalities with low dose  of dobutamine.  -  PEAK DOSE  Normal left ventricular function and global wall motion with stress. Global  LV function is preserved with stress. There were no segmental wall motion  abnormalities with dobutamine. There was normal augmentation of all left  ventricular wall segments with dobutamine. The estimated LV ejection fraction  is 65-70% with stress. Negative dobutamine echocardiography for inducible  ischemia at target heart rate.  -  -  WALL MOTION  -  Stress Results  Protocol: DobutamineMaximum Predicted HR: 161 bpm  Target HR: 137 bpm% Maximum Predicted HR: 89 %  StageDuration  (mm:ss) Heart Rate  (bpm) BPDose Comment  13:00 85 165/10010.00  23:00 95 162/9220.00  33:00 127 136/7830.00 1mg  Atropine  42:25 144 120/7240.00 .2Atropine  1R2:00 148 109/62  22:00 146 104/58  316:31 110 137/81 128/81  Stress Duration: 11:24 mm:ss *Recovery Time: 2:00 mm:ss  Maximum Stress HR: 144 bpm *    Echo 10/03/15 - Left ventricle: The cavity size was normal. Wall thickness was  increased in a pattern of mild LVH. There was moderate focal  basal hypertrophy of the septum. Systolic function was normal.  The estimated ejection fraction was in the range of 60% to 65%.  Wall motion was normal; there were no regional wall motion  abnormalities. Doppler parameters are consistent with abnormal  left ventricular relaxation (grade 1 diastolic dysfunction). The  E/e&' ratio is <8,  suggesting normal LV filling pressure.  - Left atrium: The atrium was mildly dilated.  - Right atrium: The atrium was mildly dilated.  - Inferior vena cava: The vessel was normal in size. The  respirophasic diameter changes were in the normal range (>= 50%),  consistent with normal central venous pressure.   Impressions:   - Compared to a prior study in 2016, the LVEF is higher at 60-65%.  There is mild LVH with moderate basal septal hypertrophy, normal  wall motion and mild biatrial enlargement.   EKG:  EKG is personally reviewed.  The ekg ordered today demonstrates sinus tachycardia at 104 bpm  Recent Labs: 07/31/2019: ALT 15 08/05/2019: BUN 56; Creatinine, Ser 11.19; Hemoglobin 8.6; Platelets 216; Potassium 3.9; Sodium 133  Recent Lipid Panel    Component Value Date/Time   CHOL 129 10/02/2015 0347   TRIG 64 10/02/2015 0347   HDL 44 10/02/2015 0347   CHOLHDL 2.9 10/02/2015 0347   VLDL 13 10/02/2015 0347   LDLCALC 72 10/02/2015 0347    Physical Exam:    VS:  BP 90/62   Pulse (!) 104   Temp 97.7 F (36.5 C)   Ht 6\' 1"  (1.854 m)   Wt 210 lb 3.2 oz (95.3 kg)   SpO2 96%   BMI 27.73 kg/m     Wt Readings from Last 3 Encounters:  11/10/19 210 lb 3.2 oz (95.3 kg)  09/09/19 (!) 204 lb (92.5 kg)  08/05/19 207 lb 14.3 oz (94.3 kg)    GEN: Well nourished, well developed in no acute distress HEENT: Normal, moist mucous membranes NECK: No JVD CARDIAC: regular rhythm, normal S1 and S2, no rubs or gallops. No murmurs. VASCULAR: Radial and DP pulses 2+ bilaterally. No carotid bruits RESPIRATORY:  Clear to auscultation without rales, wheezing or rhonchi  ABDOMEN: Soft, non-tender, non-distended MUSCULOSKELETAL:  Ambulates independently SKIN: Warm and dry, no edema NEUROLOGIC:  Alert and oriented x 3. No focal neuro deficits noted. 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 ESRD on dialysis [facility])  3. Hypotension of  hemodialysis   4. History of lacunar cerebrovascular accident (CVA)   5. Cardiac risk counseling   6. Counseling on health promotion and disease prevention    PLAN:    Lambl's excrescence: noted on recent echo, with comments that it was also seen in 2018 and 2020. We reviewed his history of CVA. Based on imaging, this was not felt to be an embolic stroke, and it was not acute. Comments note lacunar infarct and chronic microvascular changes.  -we discussed that typically indication for resection of these is embolic stroke or recurrent stroke. He does not meet this criteria. -he is already on aspirin -I am unclear why this would take him off of the transplant list, especially as it was noted in the past as well. I am uncertain if there is another reason based on his echo that he would no longer be a transplant candidate. Did discuss that dialysis-associated hypotension is another issue, see below  ESRD Dialysis-associated hypotension Was undergoing transplant evaluation -plans to trial midodrine with dialysis -reports he is asymptomatic with hypotension -he plans to get in touch with [facility] to undergo kidney transplant evaluation with them.  Cardiac risk counseling and prevention recommendations: -recommend heart healthy/Mediterranean diet, with whole grains, fruits, vegetable, fish, lean meats, nuts, and olive oil. Limit salt. -recommend moderate walking, 3-5 times/week for 30-50 minutes each session. Aim for at least 150 minutes.week. Goal should be pace of 3 miles/hours, or walking 1.5 miles in 30 minutes -recommend avoidance of tobacco products. Avoid excess alcohol.  Plan for follow up: 6 mos, to determine if he needs further CV evaluation for new transplant workup  Buford Dresser, MD, PhD [place]  [facility]    Medication Adjustments/Labs and Tests Ordered: Current medicines are reviewed at length with the patient today.  Concerns regarding medicines are outlined  above.  Orders Placed This Encounter  Procedures  . EKG 12-Lead   No orders of the defined types were placed in this encounter.   Patient Instructions  Medication Instructions:  Your Physician recommend you continue on your current medication as directed.    *If you need a refill on your cardiac medications before your next appointment, please call your pharmacy*   Lab Work: None ordered  Testing/Procedures: None ordered    Follow-Up: At [facility], you and your health needs are our priority.  As part of our continuing mission to provide you with exceptional heart care, we have created designated Provider Care Teams.  These Care Teams include your primary Cardiologist (physician) and Advanced Practice Providers (APPs -  Physician Assistants and Nurse Practitioners) who all work together to provide you with the care you need, when you need it.  We recommend signing up for the patient portal called "MyChart".  Sign up information is provided on this After Visit Summary.  MyChart is used to connect with patients for Virtual Visits (Telemedicine).  Patients are able to view lab/test results, encounter notes, upcoming appointments, etc.  Non-urgent messages can be sent to your provider as well.   To learn more about what you can do with MyChart, go to [email or web address].    Your next appointment:   6 month(s)  The format for your next appointment:   In Person  Provider:   Buford Dresser, MD      Signed, Buford Dresser, MD PhD 11/10/2019 1:01 PM    [place]

## 2019-11-10 NOTE — Telephone Encounter (Signed)
Pt no-showed his scheduled EGD/colonoscopy today Please fax this to PCP so he is aware

## 2019-11-10 NOTE — Telephone Encounter (Signed)
Faxed to PCP

## 2019-11-10 NOTE — Telephone Encounter (Signed)
Called  Several times left msg for pt that he had a Endo/Colon today at 2:30 and was to arrive at 1:30.

## 2019-11-10 NOTE — Patient Instructions (Signed)
Medication Instructions:  Your Physician recommend you continue on your current medication as directed.    *If you need a refill on your cardiac medications before your next appointment, please call your pharmacy*   Lab Work: None ordered   Testing/Procedures: None ordered    Follow-Up: At [facility], you and your health needs are our priority.  As part of our continuing mission to provide you with exceptional heart care, we have created designated Provider Care Teams.  These Care Teams include your primary Cardiologist (physician) and Advanced Practice Providers (APPs -  Physician Assistants and Nurse Practitioners) who all work together to provide you with the care you need, when you need it.  We recommend signing up for the patient portal called "MyChart".  Sign up information is provided on this After Visit Summary.  MyChart is used to connect with patients for Virtual Visits (Telemedicine).  Patients are able to view lab/test results, encounter notes, upcoming appointments, etc.  Non-urgent messages can be sent to your provider as well.   To learn more about what you can do with MyChart, go to [email or web address].com.    Your next appointment:   6 month(s)  The format for your next appointment:   In Person  Provider:   Bridgette Christopher, MD    

## 2019-11-12 ENCOUNTER — Ambulatory Visit: Payer: Medicare Other | Admitting: Adult Health

## 2019-11-12 ENCOUNTER — Encounter: Payer: Self-pay | Admitting: Adult Health

## 2019-11-12 NOTE — Progress Notes (Deleted)
STROKE NEUROLOGY FOLLOW UP NOTE  NAME: Carl Carden Sr. DOB: 1960-11-29  REASON FOR VISIT: stroke/headache follow-up HISTORY FROM: pt and chart  No chief complaint on file.    History Summary  Carl Echeverri Sr. is a 59 y.o. male with PMH of left corona radiata stroke and right occipital DWI changes related to PRES 05/2014, HTN, HLD, chronic headaches, ESRD on HD and GI bleed 07/2019.   Today, 11/12/2019, [name] returns for 1 year follow-up.  Stable from a neurological standpoint without new or reoccurring stroke/TIA symptoms.  Remains on aspirin 81 mg daily and atorvastatin for secondary stroke prevention without side effects.  Blood pressure today ***.  Remains on Topamax 25 mg twice daily for headache management.      History provided for reference purposes only Update 11/05/2017: Patient is being seen today for scheduled follow-up visit.  He has recently ran out of his Topamax and states for the past week he has had a daily headache in the left frontal area.  He is unsure if this is related to stopping the Topamax or daily stressors.  He plans on undergoing nephrectomy on 11/29/2017 so he is able to be placed on renal transplant list.  He continues to take aspirin 81 mg without side effects of bleeding or bruising.  Continues to take Lipitor without side effects myalgias.  Blood pressure today satisfactory 99/66.  Denies new or worsening stroke/TIA symptoms.     02/21/16 visit JX: Since last appointment, HA has been present every other day. Tylenol extra strength 2 tabs, ibuprofen 800mg , Excedrin 2 tabs  - will take one med and if doesn't work will take another. Cool compression will relieve. Works for at least 4-5 hours then HA returns - same severity prior to taking medication. At times, will wake up without HA but other times will have faint HA. He is unsure if increase in HA is due to stress with kidney issues. Every other day HA has been happening for past few months. N/V  present with dry heaving. At times sensitive to lights. Denies auras. Has not noticed if anything makes HA worse. Denies correlation between HA and HD treatments. Pain located in frontal forehead from temrpal to tempral. At times radiates to top of head. Radiates down left side of neck.        REVIEW OF SYSTEMS: Full 14 system review of systems performed and notable only for those listed below and in HPI above, all others are negative:  headache  The following represents the patient's updated allergies and side effects list: Allergies  Allergen Reactions  . Infed [Iron Dextran] Other (See Comments)    Decreased BP   . Oxycodone Nausea Only  . Vicodin [Hydrocodone-Acetaminophen] Other (See Comments)    Decreased BP  . Whole Blood Other (See Comments)    Patient refuses whole blood "unless I am going to die"  . Diclofenac Other (See Comments)    Unknown      Physical examination  There were no vitals filed for this visit. There is no height or weight on file to calculate BMI.  General: well developed, well nourished, pleasant middle-age African-American male, seated, in no evident distress Head: head normocephalic and atraumatic.   Neck: supple with no carotid or supraclavicular bruits Cardiovascular: regular rate and rhythm, no murmurs Musculoskeletal: no deformity Skin:  no rash/petichiae Vascular:  Normal pulses all extremities   Neurologic Exam Mental Status: Awake and fully alert. Oriented to place and time. Recent and remote  memory intact. Attention span, concentration and fund of knowledge appropriate. Mood and affect appropriate.  Cranial Nerves: Pupils equal, briskly reactive to light. Extraocular movements full without nystagmus. Visual fields full to confrontation. Hearing intact. Facial sensation intact. Face, tongue, palate moves normally and symmetrically.  Motor: Normal bulk and tone. Normal strength in all tested extremity muscles. Sensory.: intact to touch ,  pinprick , position and vibratory sensation.  Coordination: Rapid alternating movements normal in all extremities. Finger-to-nose and heel-to-shin performed accurately bilaterally. Gait and Station: Arises from chair without difficulty. Stance is normal. Gait demonstrates normal stride length and balance Reflexes: 1+ and symmetric. Toes downgoing.       ASSESSMENT/PLAN: [name] is a 59 year old male with history of left corona radiata infarct and possible proximal on 05/2014.  Vascular risk factors include HTN, HLD and ESRD on HD.  Patient is being seen today for scheduled follow-up visit for stroke and headache management. Headaches have been well controlled on topamax 25mg  twice a day   L CR STROKE - continue ASA and lipitor for stroke prevention -request PCP check lipid panel at follow-up visit - Follow up with your primary care physician for stroke risk factor modification. Recommend maintain blood pressure goal <130/80 and lipids with LDL cholesterol goal below 70 mg/dL.  - follow up with nephrology for ESRD on HD - follow up with cardiology and GI regularly. - check BP at home and record   HEADACHE -Restart topamax 25mg  twice a day for HA prevention. At HD day, take topamax morning dose after HD. -Refill placed   - follow up in 1 year or call earlier if needed  I spent *** minutes of face-to-face and non-face-to-face time with patient.  This included previsit chart review, lab review, study review, order entry, electronic health record documentation, patient education   Frann Rider, [facility]  [facility] [address] [place], [place] [address]  Phone [phone] Fax [phone] Note: This document was prepared with digital dictation and possible smart phrase technology. Any transcriptional errors that result from this process are unintentional.

## 2019-12-21 ENCOUNTER — Other Ambulatory Visit: Payer: Self-pay | Admitting: Nurse Practitioner

## 2020-01-04 ENCOUNTER — Telehealth: Payer: Self-pay | Admitting: Internal Medicine

## 2020-01-04 NOTE — Telephone Encounter (Signed)
Called pt he needs to schedule a PV.

## 2020-01-05 ENCOUNTER — Encounter: Payer: Medicare Other | Admitting: Internal Medicine

## 2020-02-15 DIAGNOSIS — N186 End stage renal disease: Secondary | ICD-10-CM | POA: Diagnosis not present

## 2020-02-15 DIAGNOSIS — D509 Iron deficiency anemia, unspecified: Secondary | ICD-10-CM | POA: Diagnosis not present

## 2020-02-15 DIAGNOSIS — Z7682 Awaiting organ transplant status: Secondary | ICD-10-CM | POA: Diagnosis not present

## 2020-02-15 DIAGNOSIS — E875 Hyperkalemia: Secondary | ICD-10-CM | POA: Diagnosis not present

## 2020-02-15 DIAGNOSIS — N2581 Secondary hyperparathyroidism of renal origin: Secondary | ICD-10-CM | POA: Diagnosis not present

## 2020-02-15 DIAGNOSIS — E1129 Type 2 diabetes mellitus with other diabetic kidney complication: Secondary | ICD-10-CM | POA: Diagnosis not present

## 2020-02-15 DIAGNOSIS — Z992 Dependence on renal dialysis: Secondary | ICD-10-CM | POA: Diagnosis not present

## 2020-02-15 DIAGNOSIS — D689 Coagulation defect, unspecified: Secondary | ICD-10-CM | POA: Diagnosis not present

## 2020-02-17 DIAGNOSIS — N186 End stage renal disease: Secondary | ICD-10-CM | POA: Diagnosis not present

## 2020-02-17 DIAGNOSIS — N2581 Secondary hyperparathyroidism of renal origin: Secondary | ICD-10-CM | POA: Diagnosis not present

## 2020-02-17 DIAGNOSIS — E1129 Type 2 diabetes mellitus with other diabetic kidney complication: Secondary | ICD-10-CM | POA: Diagnosis not present

## 2020-02-17 DIAGNOSIS — Z992 Dependence on renal dialysis: Secondary | ICD-10-CM | POA: Diagnosis not present

## 2020-02-17 DIAGNOSIS — E875 Hyperkalemia: Secondary | ICD-10-CM | POA: Diagnosis not present

## 2020-02-17 DIAGNOSIS — D509 Iron deficiency anemia, unspecified: Secondary | ICD-10-CM | POA: Diagnosis not present

## 2020-02-17 DIAGNOSIS — Z7682 Awaiting organ transplant status: Secondary | ICD-10-CM | POA: Diagnosis not present

## 2020-02-17 DIAGNOSIS — D689 Coagulation defect, unspecified: Secondary | ICD-10-CM | POA: Diagnosis not present

## 2020-02-19 DIAGNOSIS — E1129 Type 2 diabetes mellitus with other diabetic kidney complication: Secondary | ICD-10-CM | POA: Diagnosis not present

## 2020-02-19 DIAGNOSIS — D689 Coagulation defect, unspecified: Secondary | ICD-10-CM | POA: Diagnosis not present

## 2020-02-19 DIAGNOSIS — D509 Iron deficiency anemia, unspecified: Secondary | ICD-10-CM | POA: Diagnosis not present

## 2020-02-19 DIAGNOSIS — E875 Hyperkalemia: Secondary | ICD-10-CM | POA: Diagnosis not present

## 2020-02-19 DIAGNOSIS — Z7682 Awaiting organ transplant status: Secondary | ICD-10-CM | POA: Diagnosis not present

## 2020-02-19 DIAGNOSIS — Z992 Dependence on renal dialysis: Secondary | ICD-10-CM | POA: Diagnosis not present

## 2020-02-19 DIAGNOSIS — N186 End stage renal disease: Secondary | ICD-10-CM | POA: Diagnosis not present

## 2020-02-19 DIAGNOSIS — N2581 Secondary hyperparathyroidism of renal origin: Secondary | ICD-10-CM | POA: Diagnosis not present

## 2020-02-22 DIAGNOSIS — E875 Hyperkalemia: Secondary | ICD-10-CM | POA: Diagnosis not present

## 2020-02-22 DIAGNOSIS — Z992 Dependence on renal dialysis: Secondary | ICD-10-CM | POA: Diagnosis not present

## 2020-02-22 DIAGNOSIS — E1129 Type 2 diabetes mellitus with other diabetic kidney complication: Secondary | ICD-10-CM | POA: Diagnosis not present

## 2020-02-22 DIAGNOSIS — N186 End stage renal disease: Secondary | ICD-10-CM | POA: Diagnosis not present

## 2020-02-22 DIAGNOSIS — N2581 Secondary hyperparathyroidism of renal origin: Secondary | ICD-10-CM | POA: Diagnosis not present

## 2020-02-22 DIAGNOSIS — D509 Iron deficiency anemia, unspecified: Secondary | ICD-10-CM | POA: Diagnosis not present

## 2020-02-22 DIAGNOSIS — D689 Coagulation defect, unspecified: Secondary | ICD-10-CM | POA: Diagnosis not present

## 2020-02-22 DIAGNOSIS — Z7682 Awaiting organ transplant status: Secondary | ICD-10-CM | POA: Diagnosis not present

## 2020-02-23 ENCOUNTER — Other Ambulatory Visit: Payer: Self-pay

## 2020-02-23 ENCOUNTER — Ambulatory Visit (AMBULATORY_SURGERY_CENTER): Payer: Self-pay

## 2020-02-23 VITALS — Ht 73.0 in | Wt 210.8 lb

## 2020-02-23 DIAGNOSIS — E7800 Pure hypercholesterolemia, unspecified: Secondary | ICD-10-CM | POA: Diagnosis not present

## 2020-02-23 DIAGNOSIS — G63 Polyneuropathy in diseases classified elsewhere: Secondary | ICD-10-CM | POA: Diagnosis not present

## 2020-02-23 DIAGNOSIS — Z79899 Other long term (current) drug therapy: Secondary | ICD-10-CM | POA: Diagnosis not present

## 2020-02-23 DIAGNOSIS — Z0001 Encounter for general adult medical examination with abnormal findings: Secondary | ICD-10-CM | POA: Diagnosis not present

## 2020-02-23 DIAGNOSIS — K6389 Other specified diseases of intestine: Secondary | ICD-10-CM

## 2020-02-23 DIAGNOSIS — H6123 Impacted cerumen, bilateral: Secondary | ICD-10-CM | POA: Diagnosis not present

## 2020-02-23 DIAGNOSIS — K279 Peptic ulcer, site unspecified, unspecified as acute or chronic, without hemorrhage or perforation: Secondary | ICD-10-CM

## 2020-02-23 NOTE — Progress Notes (Signed)
No egg or soy allergy known to patient  No issues with past sedation with any surgeries or procedures No intubation problems in the past  No FH of Malignant Hyperthermia No diet pills per patient No home 02 use per patient  No blood thinners per patient  Pt denies issues with constipation  No A fib or A flutter   COVID 19 guidelines implemented in PV today with Pt and RN  Pt is fully vaccinated  for Covid x 2; Coupon given to pt in PV today , Code to Pharmacy  Due to the COVID-19 pandemic we are asking patients to follow certain guidelines.  Pt aware of COVID protocols and [place] guidelines   Patient presented to previsit today with Sutab RX filled/in hand; RX for Sutab not sent to pharmacy at pre visit;

## 2020-02-24 DIAGNOSIS — Z7682 Awaiting organ transplant status: Secondary | ICD-10-CM | POA: Diagnosis not present

## 2020-02-24 DIAGNOSIS — N2581 Secondary hyperparathyroidism of renal origin: Secondary | ICD-10-CM | POA: Diagnosis not present

## 2020-02-24 DIAGNOSIS — Z992 Dependence on renal dialysis: Secondary | ICD-10-CM | POA: Diagnosis not present

## 2020-02-24 DIAGNOSIS — D689 Coagulation defect, unspecified: Secondary | ICD-10-CM | POA: Diagnosis not present

## 2020-02-24 DIAGNOSIS — E1129 Type 2 diabetes mellitus with other diabetic kidney complication: Secondary | ICD-10-CM | POA: Diagnosis not present

## 2020-02-24 DIAGNOSIS — E875 Hyperkalemia: Secondary | ICD-10-CM | POA: Diagnosis not present

## 2020-02-24 DIAGNOSIS — N186 End stage renal disease: Secondary | ICD-10-CM | POA: Diagnosis not present

## 2020-02-24 DIAGNOSIS — D509 Iron deficiency anemia, unspecified: Secondary | ICD-10-CM | POA: Diagnosis not present

## 2020-02-26 ENCOUNTER — Telehealth: Payer: Self-pay

## 2020-02-26 DIAGNOSIS — N186 End stage renal disease: Secondary | ICD-10-CM | POA: Diagnosis not present

## 2020-02-26 DIAGNOSIS — D509 Iron deficiency anemia, unspecified: Secondary | ICD-10-CM | POA: Diagnosis not present

## 2020-02-26 DIAGNOSIS — E1129 Type 2 diabetes mellitus with other diabetic kidney complication: Secondary | ICD-10-CM | POA: Diagnosis not present

## 2020-02-26 DIAGNOSIS — Z992 Dependence on renal dialysis: Secondary | ICD-10-CM | POA: Diagnosis not present

## 2020-02-26 DIAGNOSIS — E875 Hyperkalemia: Secondary | ICD-10-CM | POA: Diagnosis not present

## 2020-02-26 DIAGNOSIS — N2581 Secondary hyperparathyroidism of renal origin: Secondary | ICD-10-CM | POA: Diagnosis not present

## 2020-02-26 DIAGNOSIS — D689 Coagulation defect, unspecified: Secondary | ICD-10-CM | POA: Diagnosis not present

## 2020-02-26 DIAGNOSIS — Z7682 Awaiting organ transplant status: Secondary | ICD-10-CM | POA: Diagnosis not present

## 2020-02-26 NOTE — Telephone Encounter (Signed)
Pt was scheduled to see Dr. Hilarie Fredrickson on 03/01/20 at 2pm. Pt stated that he needed to cancel procedure due to having to go to court on Tuesday. Stated that he will call office back to reschedule.

## 2020-02-29 DIAGNOSIS — N186 End stage renal disease: Secondary | ICD-10-CM | POA: Diagnosis not present

## 2020-02-29 DIAGNOSIS — Z992 Dependence on renal dialysis: Secondary | ICD-10-CM | POA: Diagnosis not present

## 2020-02-29 DIAGNOSIS — N2581 Secondary hyperparathyroidism of renal origin: Secondary | ICD-10-CM | POA: Diagnosis not present

## 2020-02-29 DIAGNOSIS — E1129 Type 2 diabetes mellitus with other diabetic kidney complication: Secondary | ICD-10-CM | POA: Diagnosis not present

## 2020-02-29 DIAGNOSIS — D509 Iron deficiency anemia, unspecified: Secondary | ICD-10-CM | POA: Diagnosis not present

## 2020-02-29 DIAGNOSIS — Z7682 Awaiting organ transplant status: Secondary | ICD-10-CM | POA: Diagnosis not present

## 2020-02-29 DIAGNOSIS — D689 Coagulation defect, unspecified: Secondary | ICD-10-CM | POA: Diagnosis not present

## 2020-02-29 DIAGNOSIS — E875 Hyperkalemia: Secondary | ICD-10-CM | POA: Diagnosis not present

## 2020-02-29 NOTE — Telephone Encounter (Signed)
Cancelation for [place] procedure He is to reschedule, which is advised Recall in 3 months to help ensure this gets rescheduled

## 2020-03-01 ENCOUNTER — Encounter: Payer: Medicare Other | Admitting: Internal Medicine

## 2020-03-01 NOTE — Telephone Encounter (Signed)
Recall placed for 05/30/2020

## 2020-03-02 DIAGNOSIS — Z7682 Awaiting organ transplant status: Secondary | ICD-10-CM | POA: Diagnosis not present

## 2020-03-02 DIAGNOSIS — Z992 Dependence on renal dialysis: Secondary | ICD-10-CM | POA: Diagnosis not present

## 2020-03-02 DIAGNOSIS — E875 Hyperkalemia: Secondary | ICD-10-CM | POA: Diagnosis not present

## 2020-03-02 DIAGNOSIS — D689 Coagulation defect, unspecified: Secondary | ICD-10-CM | POA: Diagnosis not present

## 2020-03-02 DIAGNOSIS — N2581 Secondary hyperparathyroidism of renal origin: Secondary | ICD-10-CM | POA: Diagnosis not present

## 2020-03-02 DIAGNOSIS — N186 End stage renal disease: Secondary | ICD-10-CM | POA: Diagnosis not present

## 2020-03-02 DIAGNOSIS — E1129 Type 2 diabetes mellitus with other diabetic kidney complication: Secondary | ICD-10-CM | POA: Diagnosis not present

## 2020-03-02 DIAGNOSIS — D509 Iron deficiency anemia, unspecified: Secondary | ICD-10-CM | POA: Diagnosis not present

## 2020-03-04 DIAGNOSIS — E875 Hyperkalemia: Secondary | ICD-10-CM | POA: Diagnosis not present

## 2020-03-04 DIAGNOSIS — E1129 Type 2 diabetes mellitus with other diabetic kidney complication: Secondary | ICD-10-CM | POA: Diagnosis not present

## 2020-03-04 DIAGNOSIS — Z7682 Awaiting organ transplant status: Secondary | ICD-10-CM | POA: Diagnosis not present

## 2020-03-04 DIAGNOSIS — D509 Iron deficiency anemia, unspecified: Secondary | ICD-10-CM | POA: Diagnosis not present

## 2020-03-04 DIAGNOSIS — D689 Coagulation defect, unspecified: Secondary | ICD-10-CM | POA: Diagnosis not present

## 2020-03-04 DIAGNOSIS — N2581 Secondary hyperparathyroidism of renal origin: Secondary | ICD-10-CM | POA: Diagnosis not present

## 2020-03-04 DIAGNOSIS — N186 End stage renal disease: Secondary | ICD-10-CM | POA: Diagnosis not present

## 2020-03-04 DIAGNOSIS — Z992 Dependence on renal dialysis: Secondary | ICD-10-CM | POA: Diagnosis not present

## 2020-03-07 DIAGNOSIS — E875 Hyperkalemia: Secondary | ICD-10-CM | POA: Diagnosis not present

## 2020-03-07 DIAGNOSIS — N2581 Secondary hyperparathyroidism of renal origin: Secondary | ICD-10-CM | POA: Diagnosis not present

## 2020-03-07 DIAGNOSIS — D509 Iron deficiency anemia, unspecified: Secondary | ICD-10-CM | POA: Diagnosis not present

## 2020-03-07 DIAGNOSIS — Z7682 Awaiting organ transplant status: Secondary | ICD-10-CM | POA: Diagnosis not present

## 2020-03-07 DIAGNOSIS — E1129 Type 2 diabetes mellitus with other diabetic kidney complication: Secondary | ICD-10-CM | POA: Diagnosis not present

## 2020-03-07 DIAGNOSIS — D689 Coagulation defect, unspecified: Secondary | ICD-10-CM | POA: Diagnosis not present

## 2020-03-07 DIAGNOSIS — N186 End stage renal disease: Secondary | ICD-10-CM | POA: Diagnosis not present

## 2020-03-07 DIAGNOSIS — Z992 Dependence on renal dialysis: Secondary | ICD-10-CM | POA: Diagnosis not present

## 2020-03-07 NOTE — Progress Notes (Signed)
STROKE NEUROLOGY FOLLOW UP NOTE  NAME: Carl Friedel Sr. DOB: [birth year]  REASON FOR VISIT: stroke/headache follow-up HISTORY FROM: pt and chart   Chief Complaint  Patient presents with  . Follow-up    Rm 14 alone Pt states he is still having headaches but not as sever, no strokes.     HPI:  Mr. Carl Economou Sr. is a 60 y.o. male with PMHx of stroke 05/2014, HTN, HLD, ESRD on HD, chronic headaches and seizure-like activity.  He has been followed in this office by Dr. Erlinda Hong since 2016 and returns today, 03/08/2020, for yearly follow-up.  Stable from stroke standpoint without new or reoccurring stroke/TIA symptoms.  Remains on aspirin 81 mg daily and atorvastatin for secondary stroke prevention without side effects.  Blood pressure today satisfactory 122/76.  He does monitor at home with fluctuations especially with HD sessions and prescribed midodrine by cardiology.  He reports being taken off kidney transplant list by [facility] and has since establish care at [facility] for possible placement on list.  Chronic tension type headache stable with ongoing use of topiramate tolerating without side effects.  He reports having mild headache once every 1 to 2 months but typically associated with blood pressure.  No concerns at this time.     REVIEW OF SYSTEMS: Full 14 system review of systems performed and notable only for those listed below and in HPI above, all others are negative:  headache   Past Medical History:  Diagnosis Date  . Anemia    hx of  . Anxiety    situational   . Arthritis    on meds  . Borderline diabetes   . Cataract    bilateral sx  . Chronic headaches   . Depression    situational   . Diverticulitis   . ESRD (end stage renal disease) ([place])     On Renal Transplant List," [facility]; MWF" (10/23/2016)  . GERD (gastroesophageal reflux disease)    with certain foods  . GI bleed   . Hypertension    diet controlled  . Parathyroid abnormality ([facility])    ectopic  parathyroid gland  . Presence of arteriovenous fistula for hemodialysis, primary ([place])    RUE  . Refusal of blood product    NO WHOLE BLOOD PROUCTS  . Secondary hyperparathyroidism ([place])   . Seizures ([place])    one episode in past  . Sleep apnea    doesn't use CPAP anymore since weight loss  . Stroke [facility])    no residual    Current Outpatient Medications on File Prior to Visit  Medication Sig Dispense Refill  . aspirin EC 81 MG tablet Take 1 tablet (81 mg total) by mouth daily. 90 tablet 0  . bimatoprost (LUMIGAN) 0.03 % ophthalmic solution Place 1 drop into both eyes daily.    . cinacalcet (SENSIPAR) 60 MG tablet Take 60 mg by mouth in the morning and at bedtime.    . gabapentin (NEURONTIN) 100 MG capsule Take 100 mg by mouth 2 (two) times daily.     Marland Kitchen ibuprofen (ADVIL) 800 MG tablet Take 800 mg by mouth 3 (three) times daily.    Marland Kitchen LOKELMA 10 g PACK packet Take 1 packet by mouth daily.    . midodrine (PROAMATINE) 10 MG tablet Take 10 mg by mouth 3 (three) times a week.    . multivitamin (RENA-VIT) TABS tablet Take 1 tablet by mouth daily.    . sevelamer carbonate (RENVELA) 800 MG tablet Take 2,400  mg by mouth 3 (three) times daily with meals. 3 tablets with every snack    . Turmeric (QC TUMERIC COMPLEX PO) daily.     No current facility-administered medications on file prior to visit.   Past Surgical History:  Procedure Laterality Date  . AV FISTULA PLACEMENT Right    right arm  . BIOPSY  08/01/2019   Procedure: BIOPSY;  Surgeon: Juanita Craver, MD;  Location: [place];  Service: Endoscopy;;  . CATARACT EXTRACTION W/ INTRAOCULAR LENS  IMPLANT, BILATERAL    . COLON SURGERY    . COLONOSCOPY N/A 08/04/2015   Procedure: COLONOSCOPY;  Surgeon: Jerene Bears, MD;  Location: [facility] ENDOSCOPY;  Service: Endoscopy;  Laterality: N/A;  . COLONOSCOPY  2017   JMP@ [facility]-good prep-mass -recall 1 yr  . ESOPHAGOGASTRODUODENOSCOPY N/A 08/01/2019   Procedure: ESOPHAGOGASTRODUODENOSCOPY (EGD);   Surgeon: Juanita Craver, MD;  Location: [facility] ENDOSCOPY;  Service: Endoscopy;  Laterality: N/A;  . ESOPHAGOGASTRODUODENOSCOPY (EGD) WITH PROPOFOL N/A 08/04/2019   Procedure: ESOPHAGOGASTRODUODENOSCOPY (EGD) WITH PROPOFOL;  Surgeon: Yetta Flock, MD;  Location: [place];  Service: Gastroenterology;  Laterality: N/A;  . graft left arm Left    for dialysis x 2  . INSERTION OF DIALYSIS CATHETER     Rt chest  . LAPAROSCOPIC RIGHT COLECTOMY N/A 08/05/2015   Procedure: LAPAROSCOPIC RIGHT COLECTOMY- ASCENDING;  Surgeon: Stark Klein, MD;  Location: [place];  Service: General;  Laterality: N/A;  . MASS EXCISION Left 05/28/2019   Procedure: EXCISION SOFT TISSUE MASS LEFT SHOULDER;  Surgeon: Armandina Gemma, MD;  Location: [facility];  Service: General;  Laterality: Left;  . NEPHRECTOMY Bilateral   . PARATHYROIDECTOMY N/A 06/12/2016   Procedure: TOTAL PARATHYROIDECTOMY WITH AUTOTRANSPLANTATION TO LEFT FOREARM;  Surgeon: Armandina Gemma, MD;  Location: [place];  Service: General;  Laterality: N/A;  . PARATHYROIDECTOMY  10/23/2016  . PARATHYROIDECTOMY N/A 10/23/2016   Procedure: PARATHYROIDECTOMY;  Surgeon: Armandina Gemma, MD;  Location: [place];  Service: General;  Laterality: N/A;  . REVISON OF ARTERIOVENOUS FISTULA Right 07/16/2017   Procedure: REVISION OF ARTERIOVENOUS FISTULA  Right ARM;  Surgeon: Waynetta Sandy, MD;  Location: [place];  Service: Vascular;  Laterality: Right;  . UPPER GASTROINTESTINAL ENDOSCOPY  2021   @ [facility]     The following represents the patient's updated allergies and side effects list: Allergies  Allergen Reactions  . Infed [Iron Dextran] Other (See Comments)    Decreased BP   . Oxycodone Nausea Only  . Vicodin [Hydrocodone-Acetaminophen] Other (See Comments)    Decreased BP  . Whole Blood Other (See Comments)    Patient refuses whole blood "unless I am going to die"  . Hydrocodone-Acetaminophen     Other reaction(s): lower BP  . Diclofenac Other (See Comments)    Unknown     The neurologically relevant items on the patient's problem list were reviewed on today's visit.    Physical exam  Today's Vitals   03/08/20 0759  BP: 122/76  Pulse: (!) 103  Weight: 215 lb (97.5 kg)  Height: 6\' 1"  (1.854 m)   Body mass index is 28.37 kg/m.  General: well developed, well nourished, pleasant middle-age African-American male, seated, in no evident distress Head: head normocephalic and atraumatic.   Neck: supple with no carotid or supraclavicular bruits Cardiovascular: regular rate and rhythm, no murmurs Musculoskeletal: no deformity Skin:  no rash/petichiae Vascular:  Normal pulses all extremities   Neurologic Exam Mental Status: Awake and fully alert.   Fluent speech and language.  Oriented  to place and time. Recent and remote memory intact. Attention span, concentration and fund of knowledge appropriate. Mood and affect appropriate.  Cranial Nerves: Pupils equal, briskly reactive to light. Extraocular movements full without nystagmus. Visual fields full to confrontation. Hearing intact. Facial sensation intact. Face, tongue, palate moves normally and symmetrically.  Motor: Normal bulk and tone. Normal strength in all tested extremity muscles. Sensory.: intact to touch , pinprick , position and vibratory sensation.  Coordination: Rapid alternating movements normal in all extremities. Finger-to-nose and heel-to-shin performed accurately bilaterally. Gait and Station: Arises from chair without difficulty. Stance is normal. Gait demonstrates normal stride length and balance Reflexes: 1+ and symmetric. Toes downgoing.       Assessment: Elaine Middleton is a 59 year old male with history of left corona radiata infarct and possible proximal on 05/2014 without residual deficits.  Vascular risk factors include HTN, HLD and ESRD on HD as well as chronic headaches well managed on topiramate.    Plan:  -Continue topamax 25mg  twice daily for HA prevention. At HD day,  take topamax morning dose after HD. -Refill placed - continue ASA and lipitor for stroke prevention -Repeat lipid panel today -Discussed secondary stroke prevention measures and importance of routine follow up with your primary care physician for stroke risk factor modification. Recommend maintain blood pressure goal <130/80 and lipids with LDL cholesterol goal below 70 mg/dL.     Follow-up in 1 year or call earlier if needed   CC:  [facility] provider: Dr. Jerold Coombe, [place], MD    I spent 30 minutes of face-to-face and non-face-to-face time with patient.  This included previsit chart review, lab review, study review, order entry, electronic health record documentation, patient education and discussion regarding history of prior stroke and importance of managing stroke risk factors, chronic headaches and ongoing use of topiramate and answered all other questions to patient satisfaction   Frann Rider, AGNP-BC  [facility] [address] [place], [place] [address]  Phone [phone] Fax [phone] Note: This document was prepared with digital dictation and possible smart phrase technology. Any transcriptional errors that result from this process are unintentional.

## 2020-03-08 ENCOUNTER — Encounter: Payer: Self-pay | Admitting: Podiatry

## 2020-03-08 ENCOUNTER — Ambulatory Visit (INDEPENDENT_AMBULATORY_CARE_PROVIDER_SITE_OTHER): Payer: Medicare Other | Admitting: Adult Health

## 2020-03-08 ENCOUNTER — Other Ambulatory Visit: Payer: Self-pay

## 2020-03-08 ENCOUNTER — Ambulatory Visit (INDEPENDENT_AMBULATORY_CARE_PROVIDER_SITE_OTHER): Payer: Medicare Other | Admitting: Podiatry

## 2020-03-08 ENCOUNTER — Ambulatory Visit: Payer: Self-pay

## 2020-03-08 ENCOUNTER — Encounter: Payer: Self-pay | Admitting: Adult Health

## 2020-03-08 VITALS — BP 122/76 | HR 103 | Ht 73.0 in | Wt 215.0 lb

## 2020-03-08 DIAGNOSIS — Z8673 Personal history of transient ischemic attack (TIA), and cerebral infarction without residual deficits: Secondary | ICD-10-CM

## 2020-03-08 DIAGNOSIS — I10 Essential (primary) hypertension: Secondary | ICD-10-CM | POA: Diagnosis not present

## 2020-03-08 DIAGNOSIS — M21619 Bunion of unspecified foot: Secondary | ICD-10-CM | POA: Diagnosis not present

## 2020-03-08 DIAGNOSIS — M2141 Flat foot [pes planus] (acquired), right foot: Secondary | ICD-10-CM | POA: Diagnosis not present

## 2020-03-08 DIAGNOSIS — M2042 Other hammer toe(s) (acquired), left foot: Secondary | ICD-10-CM | POA: Diagnosis not present

## 2020-03-08 DIAGNOSIS — E7800 Pure hypercholesterolemia, unspecified: Secondary | ICD-10-CM | POA: Insufficient documentation

## 2020-03-08 DIAGNOSIS — G63 Polyneuropathy in diseases classified elsewhere: Secondary | ICD-10-CM | POA: Insufficient documentation

## 2020-03-08 DIAGNOSIS — M2041 Other hammer toe(s) (acquired), right foot: Secondary | ICD-10-CM

## 2020-03-08 DIAGNOSIS — Z992 Dependence on renal dialysis: Secondary | ICD-10-CM | POA: Insufficient documentation

## 2020-03-08 DIAGNOSIS — M2142 Flat foot [pes planus] (acquired), left foot: Secondary | ICD-10-CM | POA: Diagnosis not present

## 2020-03-08 DIAGNOSIS — G44229 Chronic tension-type headache, not intractable: Secondary | ICD-10-CM

## 2020-03-08 DIAGNOSIS — E1149 Type 2 diabetes mellitus with other diabetic neurological complication: Secondary | ICD-10-CM | POA: Diagnosis not present

## 2020-03-08 DIAGNOSIS — M7750 Other enthesopathy of unspecified foot: Secondary | ICD-10-CM

## 2020-03-08 DIAGNOSIS — E119 Type 2 diabetes mellitus without complications: Secondary | ICD-10-CM | POA: Diagnosis not present

## 2020-03-08 DIAGNOSIS — M533 Sacrococcygeal disorders, not elsewhere classified: Secondary | ICD-10-CM | POA: Insufficient documentation

## 2020-03-08 DIAGNOSIS — E785 Hyperlipidemia, unspecified: Secondary | ICD-10-CM

## 2020-03-08 DIAGNOSIS — N186 End stage renal disease: Secondary | ICD-10-CM | POA: Insufficient documentation

## 2020-03-08 DIAGNOSIS — K5792 Diverticulitis of intestine, part unspecified, without perforation or abscess without bleeding: Secondary | ICD-10-CM | POA: Insufficient documentation

## 2020-03-08 MED ORDER — ATORVASTATIN CALCIUM 10 MG PO TABS: 10.0000 mg | ORAL_TABLET | Freq: Every day | ORAL | 3 refills | Status: DC

## 2020-03-08 MED ORDER — TOPIRAMATE 25 MG PO TABS: 25.0000 mg | ORAL_TABLET | Freq: Two times a day (BID) | ORAL | 3 refills | Status: DC

## 2020-03-08 NOTE — Progress Notes (Signed)
I agree with the above plan 

## 2020-03-08 NOTE — Patient Instructions (Signed)
Continue aspirin 81 mg daily  and atorvastatin  for secondary stroke prevention  Continue topamax 25mg  twice daily for headache prevention  Continue to follow up with PCP regarding cholesterol and blood pressure management  Maintain strict control of hypertension with blood pressure goal below 130/90 and cholesterol with LDL cholesterol (bad cholesterol) goal below 70 mg/dL.       Followup in the future with me in 1 year or call earlier if needed       Thank you for coming to see [place] at [facility]. I hope we have been able to provide you high quality care today.  You may receive a patient satisfaction survey over the next few weeks. We would appreciate your feedback and comments so that we may continue to improve ourselves and the health of our patients.

## 2020-03-09 ENCOUNTER — Other Ambulatory Visit: Payer: Self-pay | Admitting: Adult Health

## 2020-03-09 DIAGNOSIS — N186 End stage renal disease: Secondary | ICD-10-CM | POA: Diagnosis not present

## 2020-03-09 DIAGNOSIS — E1129 Type 2 diabetes mellitus with other diabetic kidney complication: Secondary | ICD-10-CM | POA: Diagnosis not present

## 2020-03-09 DIAGNOSIS — N2581 Secondary hyperparathyroidism of renal origin: Secondary | ICD-10-CM | POA: Diagnosis not present

## 2020-03-09 DIAGNOSIS — D689 Coagulation defect, unspecified: Secondary | ICD-10-CM | POA: Diagnosis not present

## 2020-03-09 DIAGNOSIS — E875 Hyperkalemia: Secondary | ICD-10-CM | POA: Diagnosis not present

## 2020-03-09 DIAGNOSIS — Z7682 Awaiting organ transplant status: Secondary | ICD-10-CM | POA: Diagnosis not present

## 2020-03-09 DIAGNOSIS — Z992 Dependence on renal dialysis: Secondary | ICD-10-CM | POA: Diagnosis not present

## 2020-03-09 DIAGNOSIS — D509 Iron deficiency anemia, unspecified: Secondary | ICD-10-CM | POA: Diagnosis not present

## 2020-03-09 LAB — LIPID PANEL
Chol/HDL Ratio: 2.6 ratio (ref 0.0–5.0)
Cholesterol, Total: 157 mg/dL (ref 100–199)
HDL: 61 mg/dL (ref >39)
LDL Chol Calc (NIH): 84 mg/dL (ref 0–99)
Triglycerides: 60 mg/dL (ref 0–149)
VLDL Cholesterol Cal: 12 mg/dL (ref 5–40)

## 2020-03-09 MED ORDER — ATORVASTATIN CALCIUM 20 MG PO TABS: 20.0000 mg | ORAL_TABLET | Freq: Every day | ORAL | 3 refills | Status: DC

## 2020-03-11 DIAGNOSIS — E875 Hyperkalemia: Secondary | ICD-10-CM | POA: Diagnosis not present

## 2020-03-11 DIAGNOSIS — E1129 Type 2 diabetes mellitus with other diabetic kidney complication: Secondary | ICD-10-CM | POA: Diagnosis not present

## 2020-03-11 DIAGNOSIS — D509 Iron deficiency anemia, unspecified: Secondary | ICD-10-CM | POA: Diagnosis not present

## 2020-03-11 DIAGNOSIS — D689 Coagulation defect, unspecified: Secondary | ICD-10-CM | POA: Diagnosis not present

## 2020-03-11 DIAGNOSIS — N186 End stage renal disease: Secondary | ICD-10-CM | POA: Diagnosis not present

## 2020-03-11 DIAGNOSIS — Z7682 Awaiting organ transplant status: Secondary | ICD-10-CM | POA: Diagnosis not present

## 2020-03-11 DIAGNOSIS — N2581 Secondary hyperparathyroidism of renal origin: Secondary | ICD-10-CM | POA: Diagnosis not present

## 2020-03-11 DIAGNOSIS — Z992 Dependence on renal dialysis: Secondary | ICD-10-CM | POA: Diagnosis not present

## 2020-03-13 NOTE — Progress Notes (Signed)
Subjective:   Patient ID: Carl Found Sr., male   DOB: 60 y.o.   MRN: [id number]   HPI 60 year old male presents the office today for diabetic foot exam.  He is also on dialysis.  He has flat feet and he states that he recently went to the [employer] and tried orthotics which help his feet quite a bit however given cost he did not get them.  He presents today for evaluation of orthotics.  He does get some occasional tingling in the toes.  He does have neuropathy and is currently on gabapentin.  He denies any open sores.  No claudication symptoms he reports.  He has no other concerns today.  Unsure of last A1c.  Per the chart last A1c was 5.3 on May 28, 2019.   Review of Systems  All other systems reviewed and are negative.  Past Medical History:  Diagnosis Date  . Anemia    hx of  . Anxiety    situational   . Arthritis    on meds  . Borderline diabetes   . Cataract    bilateral sx  . Chronic headaches   . Depression    situational   . Diverticulitis   . ESRD (end stage renal disease) ([place])     On Renal Transplant List," [facility]; MWF" (10/23/2016)  . GERD (gastroesophageal reflux disease)    with certain foods  . GI bleed   . Hypertension    diet controlled  . Parathyroid abnormality (HCC)    ectopic parathyroid gland  . Presence of arteriovenous fistula for hemodialysis, primary ([place])    RUE  . Refusal of blood product    NO WHOLE BLOOD PROUCTS  . Secondary hyperparathyroidism ([place])   . Seizures ([place])    one episode in past  . Sleep apnea    doesn't use CPAP anymore since weight loss  . Stroke [facility])    no residual    Past Surgical History:  Procedure Laterality Date  . AV FISTULA PLACEMENT Right    right arm  . BIOPSY  08/01/2019   Procedure: BIOPSY;  Surgeon: Juanita Craver, MD;  Location: [place];  Service: Endoscopy;;  . CATARACT EXTRACTION W/ INTRAOCULAR LENS  IMPLANT, BILATERAL    . COLON SURGERY    . COLONOSCOPY N/A 08/04/2015   Procedure:  COLONOSCOPY;  Surgeon: Jerene Bears, MD;  Location: [facility] ENDOSCOPY;  Service: Endoscopy;  Laterality: N/A;  . COLONOSCOPY  2017   JMP@ [facility]-good prep-mass -recall 1 yr  . ESOPHAGOGASTRODUODENOSCOPY N/A 08/01/2019   Procedure: ESOPHAGOGASTRODUODENOSCOPY (EGD);  Surgeon: Juanita Craver, MD;  Location: [facility] ENDOSCOPY;  Service: Endoscopy;  Laterality: N/A;  . ESOPHAGOGASTRODUODENOSCOPY (EGD) WITH PROPOFOL N/A 08/04/2019   Procedure: ESOPHAGOGASTRODUODENOSCOPY (EGD) WITH PROPOFOL;  Surgeon: Yetta Flock, MD;  Location: [place];  Service: Gastroenterology;  Laterality: N/A;  . graft left arm Left    for dialysis x 2  . INSERTION OF DIALYSIS CATHETER     Rt chest  . LAPAROSCOPIC RIGHT COLECTOMY N/A 08/05/2015   Procedure: LAPAROSCOPIC RIGHT COLECTOMY- ASCENDING;  Surgeon: Stark Klein, MD;  Location: [place];  Service: General;  Laterality: N/A;  . MASS EXCISION Left 05/28/2019   Procedure: EXCISION SOFT TISSUE MASS LEFT SHOULDER;  Surgeon: Armandina Gemma, MD;  [facility];  Service: General;  Laterality: Left;  . NEPHRECTOMY Bilateral   . PARATHYROIDECTOMY N/A 06/12/2016   Procedure: TOTAL PARATHYROIDECTOMY WITH AUTOTRANSPLANTATION TO LEFT FOREARM;  Surgeon: Armandina Gemma, MD;  Location: [place];  Service: General;  Laterality: N/A;  . PARATHYROIDECTOMY  10/23/2016  . PARATHYROIDECTOMY N/A 10/23/2016   Procedure: PARATHYROIDECTOMY;  Surgeon: Armandina Gemma, MD;  Location: [place];  Service: General;  Laterality: N/A;  . REVISON OF ARTERIOVENOUS FISTULA Right 07/16/2017   Procedure: REVISION OF ARTERIOVENOUS FISTULA  Right ARM;  Surgeon: Waynetta Sandy, MD;  Location: [place];  Service: Vascular;  Laterality: Right;  . UPPER GASTROINTESTINAL ENDOSCOPY  2021   @ [facility]     Current Outpatient Medications:  .  aspirin EC 81 MG tablet, Take 1 tablet (81 mg total) by mouth daily., Disp: 90 tablet, Rfl: 0 .  atorvastatin (LIPITOR) 20 MG tablet, Take 1 tablet (20 mg total) by mouth daily at 6 PM.,  Disp: 90 tablet, Rfl: 3 .  bimatoprost (LUMIGAN) 0.03 % ophthalmic solution, Place 1 drop into both eyes daily., Disp: , Rfl:  .  cinacalcet (SENSIPAR) 60 MG tablet, Take 60 mg by mouth in the morning and at bedtime., Disp: , Rfl:  .  gabapentin (NEURONTIN) 100 MG capsule, Take 100 mg by mouth 2 (two) times daily. , Disp: , Rfl:  .  ibuprofen (ADVIL) 800 MG tablet, Take 800 mg by mouth 3 (three) times daily., Disp: , Rfl:  .  LOKELMA 10 g PACK packet, Take 1 packet by mouth daily., Disp: , Rfl:  .  midodrine (PROAMATINE) 10 MG tablet, Take 10 mg by mouth 3 (three) times a week., Disp: , Rfl:  .  multivitamin (RENA-VIT) TABS tablet, Take 1 tablet by mouth daily., Disp: , Rfl:  .  sevelamer carbonate (RENVELA) 800 MG tablet, Take 2,400 mg by mouth 3 (three) times daily with meals. 3 tablets with every snack, Disp: , Rfl:  .  topiramate (TOPAMAX) 25 MG tablet, Take 1 tablet (25 mg total) by mouth 2 (two) times daily., Disp: 180 tablet, Rfl: 3 .  Turmeric (QC TUMERIC COMPLEX PO), daily., Disp: , Rfl:   Allergies  Allergen Reactions  . Infed [Iron Dextran] Other (See Comments)    Decreased BP   . Oxycodone Nausea Only  . Vicodin [Hydrocodone-Acetaminophen] Other (See Comments)    Decreased BP  . Whole Blood Other (See Comments)    Patient refuses whole blood "unless I am going to die"  . Hydrocodone-Acetaminophen     Other reaction(s): lower BP  . Diclofenac Other (See Comments)    Unknown          Objective:  Physical Exam  General: AAO x3, NAD  Dermatological: Skin is warm, dry and supple bilateral.  There are no open sores, no preulcerative lesions, no rash or signs of infection present.  Vascular: Dorsalis Pedis artery and Posterior Tibial artery pedal pulses are 2/4 bilateral with immedate capillary fill time. There is no pain with calf compression, swelling, warmth, erythema.   Neruologic: Flatfoot is present with bunion, hammertoe deformities.  There is no areas of pinpoint  tenderness.  No edema, erythema.  Muscular strength 5/5 in all groups tested bilateral.  Gait: Unassisted, Nonantalgic.      Assessment:   60 year old male with flatfoot deformity, type 2 diabetes with neuropathy, Diabetic foot exam    Plan:  -Treatment options discussed including all alternatives, risks, and complications -Etiology of symptoms were discussed -We discussed orthotics versus other issues.  He is going to see our pedorthotist, Liliane Channel, for orthotics.  -Discussed supportive shoes as well as the importance of daily foot inspection.  Trula Slade DPM

## 2020-03-14 DIAGNOSIS — Z7682 Awaiting organ transplant status: Secondary | ICD-10-CM | POA: Diagnosis not present

## 2020-03-14 DIAGNOSIS — N2581 Secondary hyperparathyroidism of renal origin: Secondary | ICD-10-CM | POA: Diagnosis not present

## 2020-03-14 DIAGNOSIS — E1129 Type 2 diabetes mellitus with other diabetic kidney complication: Secondary | ICD-10-CM | POA: Diagnosis not present

## 2020-03-14 DIAGNOSIS — D689 Coagulation defect, unspecified: Secondary | ICD-10-CM | POA: Diagnosis not present

## 2020-03-14 DIAGNOSIS — I12 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 stage renal disease: Secondary | ICD-10-CM | POA: Diagnosis not present

## 2020-03-14 DIAGNOSIS — D509 Iron deficiency anemia, unspecified: Secondary | ICD-10-CM | POA: Diagnosis not present

## 2020-03-14 DIAGNOSIS — E875 Hyperkalemia: Secondary | ICD-10-CM | POA: Diagnosis not present

## 2020-03-14 DIAGNOSIS — Z992 Dependence on renal dialysis: Secondary | ICD-10-CM | POA: Diagnosis not present

## 2020-03-14 DIAGNOSIS — N186 End stage renal disease: Secondary | ICD-10-CM | POA: Diagnosis not present

## 2020-03-16 DIAGNOSIS — N186 End stage renal disease: Secondary | ICD-10-CM | POA: Diagnosis not present

## 2020-03-16 DIAGNOSIS — D689 Coagulation defect, unspecified: Secondary | ICD-10-CM | POA: Diagnosis not present

## 2020-03-16 DIAGNOSIS — N2581 Secondary hyperparathyroidism of renal origin: Secondary | ICD-10-CM | POA: Diagnosis not present

## 2020-03-16 DIAGNOSIS — E875 Hyperkalemia: Secondary | ICD-10-CM | POA: Diagnosis not present

## 2020-03-16 DIAGNOSIS — D631 Anemia in chronic kidney disease: Secondary | ICD-10-CM | POA: Diagnosis not present

## 2020-03-16 DIAGNOSIS — Z992 Dependence on renal dialysis: Secondary | ICD-10-CM | POA: Diagnosis not present

## 2020-03-19 DIAGNOSIS — D689 Coagulation defect, unspecified: Secondary | ICD-10-CM | POA: Diagnosis not present

## 2020-03-19 DIAGNOSIS — E875 Hyperkalemia: Secondary | ICD-10-CM | POA: Diagnosis not present

## 2020-03-19 DIAGNOSIS — Z992 Dependence on renal dialysis: Secondary | ICD-10-CM | POA: Diagnosis not present

## 2020-03-19 DIAGNOSIS — N2581 Secondary hyperparathyroidism of renal origin: Secondary | ICD-10-CM | POA: Diagnosis not present

## 2020-03-19 DIAGNOSIS — D631 Anemia in chronic kidney disease: Secondary | ICD-10-CM | POA: Diagnosis not present

## 2020-03-19 DIAGNOSIS — N186 End stage renal disease: Secondary | ICD-10-CM | POA: Diagnosis not present

## 2020-03-21 DIAGNOSIS — N2581 Secondary hyperparathyroidism of renal origin: Secondary | ICD-10-CM | POA: Diagnosis not present

## 2020-03-21 DIAGNOSIS — E875 Hyperkalemia: Secondary | ICD-10-CM | POA: Diagnosis not present

## 2020-03-21 DIAGNOSIS — N186 End stage renal disease: Secondary | ICD-10-CM | POA: Diagnosis not present

## 2020-03-21 DIAGNOSIS — D689 Coagulation defect, unspecified: Secondary | ICD-10-CM | POA: Diagnosis not present

## 2020-03-21 DIAGNOSIS — Z992 Dependence on renal dialysis: Secondary | ICD-10-CM | POA: Diagnosis not present

## 2020-03-21 DIAGNOSIS — D631 Anemia in chronic kidney disease: Secondary | ICD-10-CM | POA: Diagnosis not present

## 2020-03-23 DIAGNOSIS — Z992 Dependence on renal dialysis: Secondary | ICD-10-CM | POA: Diagnosis not present

## 2020-03-23 DIAGNOSIS — D631 Anemia in chronic kidney disease: Secondary | ICD-10-CM | POA: Diagnosis not present

## 2020-03-23 DIAGNOSIS — E875 Hyperkalemia: Secondary | ICD-10-CM | POA: Diagnosis not present

## 2020-03-23 DIAGNOSIS — D689 Coagulation defect, unspecified: Secondary | ICD-10-CM | POA: Diagnosis not present

## 2020-03-23 DIAGNOSIS — N186 End stage renal disease: Secondary | ICD-10-CM | POA: Diagnosis not present

## 2020-03-23 DIAGNOSIS — N2581 Secondary hyperparathyroidism of renal origin: Secondary | ICD-10-CM | POA: Diagnosis not present

## 2020-03-24 ENCOUNTER — Other Ambulatory Visit: Payer: Self-pay

## 2020-03-24 ENCOUNTER — Ambulatory Visit: Payer: Medicare Other | Admitting: Orthotics

## 2020-03-24 DIAGNOSIS — E119 Type 2 diabetes mellitus without complications: Secondary | ICD-10-CM

## 2020-03-24 DIAGNOSIS — M2041 Other hammer toe(s) (acquired), right foot: Secondary | ICD-10-CM

## 2020-03-24 DIAGNOSIS — M2141 Flat foot [pes planus] (acquired), right foot: Secondary | ICD-10-CM

## 2020-03-24 DIAGNOSIS — M2142 Flat foot [pes planus] (acquired), left foot: Secondary | ICD-10-CM

## 2020-03-24 DIAGNOSIS — M2042 Other hammer toe(s) (acquired), left foot: Secondary | ICD-10-CM

## 2020-03-24 DIAGNOSIS — M21619 Bunion of unspecified foot: Secondary | ICD-10-CM

## 2020-03-24 DIAGNOSIS — E1149 Type 2 diabetes mellitus with other diabetic neurological complication: Secondary | ICD-10-CM

## 2020-03-24 NOTE — Progress Notes (Signed)

## 2020-03-25 DIAGNOSIS — N2581 Secondary hyperparathyroidism of renal origin: Secondary | ICD-10-CM | POA: Diagnosis not present

## 2020-03-25 DIAGNOSIS — D689 Coagulation defect, unspecified: Secondary | ICD-10-CM | POA: Diagnosis not present

## 2020-03-25 DIAGNOSIS — D631 Anemia in chronic kidney disease: Secondary | ICD-10-CM | POA: Diagnosis not present

## 2020-03-25 DIAGNOSIS — E875 Hyperkalemia: Secondary | ICD-10-CM | POA: Diagnosis not present

## 2020-03-25 DIAGNOSIS — Z992 Dependence on renal dialysis: Secondary | ICD-10-CM | POA: Diagnosis not present

## 2020-03-25 DIAGNOSIS — N186 End stage renal disease: Secondary | ICD-10-CM | POA: Diagnosis not present

## 2020-03-28 DIAGNOSIS — Z992 Dependence on renal dialysis: Secondary | ICD-10-CM | POA: Diagnosis not present

## 2020-03-28 DIAGNOSIS — E875 Hyperkalemia: Secondary | ICD-10-CM | POA: Diagnosis not present

## 2020-03-28 DIAGNOSIS — N2581 Secondary hyperparathyroidism of renal origin: Secondary | ICD-10-CM | POA: Diagnosis not present

## 2020-03-28 DIAGNOSIS — D689 Coagulation defect, unspecified: Secondary | ICD-10-CM | POA: Diagnosis not present

## 2020-03-28 DIAGNOSIS — N186 End stage renal disease: Secondary | ICD-10-CM | POA: Diagnosis not present

## 2020-03-28 DIAGNOSIS — D631 Anemia in chronic kidney disease: Secondary | ICD-10-CM | POA: Diagnosis not present

## 2020-03-30 DIAGNOSIS — N186 End stage renal disease: Secondary | ICD-10-CM | POA: Diagnosis not present

## 2020-03-30 DIAGNOSIS — D631 Anemia in chronic kidney disease: Secondary | ICD-10-CM | POA: Diagnosis not present

## 2020-03-30 DIAGNOSIS — Z992 Dependence on renal dialysis: Secondary | ICD-10-CM | POA: Diagnosis not present

## 2020-03-30 DIAGNOSIS — N2581 Secondary hyperparathyroidism of renal origin: Secondary | ICD-10-CM | POA: Diagnosis not present

## 2020-03-30 DIAGNOSIS — E875 Hyperkalemia: Secondary | ICD-10-CM | POA: Diagnosis not present

## 2020-03-30 DIAGNOSIS — D689 Coagulation defect, unspecified: Secondary | ICD-10-CM | POA: Diagnosis not present

## 2020-04-01 DIAGNOSIS — Z992 Dependence on renal dialysis: Secondary | ICD-10-CM | POA: Diagnosis not present

## 2020-04-01 DIAGNOSIS — D689 Coagulation defect, unspecified: Secondary | ICD-10-CM | POA: Diagnosis not present

## 2020-04-01 DIAGNOSIS — D631 Anemia in chronic kidney disease: Secondary | ICD-10-CM | POA: Diagnosis not present

## 2020-04-01 DIAGNOSIS — E875 Hyperkalemia: Secondary | ICD-10-CM | POA: Diagnosis not present

## 2020-04-01 DIAGNOSIS — N186 End stage renal disease: Secondary | ICD-10-CM | POA: Diagnosis not present

## 2020-04-01 DIAGNOSIS — N2581 Secondary hyperparathyroidism of renal origin: Secondary | ICD-10-CM | POA: Diagnosis not present

## 2020-04-04 DIAGNOSIS — E875 Hyperkalemia: Secondary | ICD-10-CM | POA: Diagnosis not present

## 2020-04-04 DIAGNOSIS — Z992 Dependence on renal dialysis: Secondary | ICD-10-CM | POA: Diagnosis not present

## 2020-04-04 DIAGNOSIS — N2581 Secondary hyperparathyroidism of renal origin: Secondary | ICD-10-CM | POA: Diagnosis not present

## 2020-04-04 DIAGNOSIS — D631 Anemia in chronic kidney disease: Secondary | ICD-10-CM | POA: Diagnosis not present

## 2020-04-04 DIAGNOSIS — N186 End stage renal disease: Secondary | ICD-10-CM | POA: Diagnosis not present

## 2020-04-04 DIAGNOSIS — D689 Coagulation defect, unspecified: Secondary | ICD-10-CM | POA: Diagnosis not present

## 2020-04-06 DIAGNOSIS — R7303 Prediabetes: Secondary | ICD-10-CM | POA: Diagnosis not present

## 2020-04-06 DIAGNOSIS — Z992 Dependence on renal dialysis: Secondary | ICD-10-CM | POA: Diagnosis not present

## 2020-04-06 DIAGNOSIS — M1990 Unspecified osteoarthritis, unspecified site: Secondary | ICD-10-CM | POA: Diagnosis not present

## 2020-04-06 DIAGNOSIS — N2581 Secondary hyperparathyroidism of renal origin: Secondary | ICD-10-CM | POA: Diagnosis not present

## 2020-04-06 DIAGNOSIS — N186 End stage renal disease: Secondary | ICD-10-CM | POA: Diagnosis not present

## 2020-04-06 DIAGNOSIS — Z008 Encounter for other general examination: Secondary | ICD-10-CM | POA: Diagnosis not present

## 2020-04-06 DIAGNOSIS — G629 Polyneuropathy, unspecified: Secondary | ICD-10-CM | POA: Diagnosis not present

## 2020-04-06 DIAGNOSIS — D689 Coagulation defect, unspecified: Secondary | ICD-10-CM | POA: Diagnosis not present

## 2020-04-06 DIAGNOSIS — D631 Anemia in chronic kidney disease: Secondary | ICD-10-CM | POA: Diagnosis not present

## 2020-04-06 DIAGNOSIS — E785 Hyperlipidemia, unspecified: Secondary | ICD-10-CM | POA: Diagnosis not present

## 2020-04-06 DIAGNOSIS — E875 Hyperkalemia: Secondary | ICD-10-CM | POA: Diagnosis not present

## 2020-04-06 DIAGNOSIS — Z0000 Encounter for general adult medical examination without abnormal findings: Secondary | ICD-10-CM | POA: Diagnosis not present

## 2020-04-06 DIAGNOSIS — I770 Arteriovenous fistula, acquired: Secondary | ICD-10-CM | POA: Diagnosis not present

## 2020-04-06 DIAGNOSIS — K219 Gastro-esophageal reflux disease without esophagitis: Secondary | ICD-10-CM | POA: Diagnosis not present

## 2020-04-08 DIAGNOSIS — D689 Coagulation defect, unspecified: Secondary | ICD-10-CM | POA: Diagnosis not present

## 2020-04-08 DIAGNOSIS — D631 Anemia in chronic kidney disease: Secondary | ICD-10-CM | POA: Diagnosis not present

## 2020-04-08 DIAGNOSIS — E875 Hyperkalemia: Secondary | ICD-10-CM | POA: Diagnosis not present

## 2020-04-08 DIAGNOSIS — Z992 Dependence on renal dialysis: Secondary | ICD-10-CM | POA: Diagnosis not present

## 2020-04-08 DIAGNOSIS — N2581 Secondary hyperparathyroidism of renal origin: Secondary | ICD-10-CM | POA: Diagnosis not present

## 2020-04-08 DIAGNOSIS — N186 End stage renal disease: Secondary | ICD-10-CM | POA: Diagnosis not present

## 2020-04-11 DIAGNOSIS — I12 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 stage renal disease: Secondary | ICD-10-CM | POA: Diagnosis not present

## 2020-04-11 DIAGNOSIS — N2581 Secondary hyperparathyroidism of renal origin: Secondary | ICD-10-CM | POA: Diagnosis not present

## 2020-04-11 DIAGNOSIS — Z992 Dependence on renal dialysis: Secondary | ICD-10-CM | POA: Diagnosis not present

## 2020-04-11 DIAGNOSIS — D631 Anemia in chronic kidney disease: Secondary | ICD-10-CM | POA: Diagnosis not present

## 2020-04-11 DIAGNOSIS — E875 Hyperkalemia: Secondary | ICD-10-CM | POA: Diagnosis not present

## 2020-04-11 DIAGNOSIS — D689 Coagulation defect, unspecified: Secondary | ICD-10-CM | POA: Diagnosis not present

## 2020-04-11 DIAGNOSIS — N186 End stage renal disease: Secondary | ICD-10-CM | POA: Diagnosis not present

## 2020-04-13 DIAGNOSIS — N186 End stage renal disease: Secondary | ICD-10-CM | POA: Diagnosis not present

## 2020-04-13 DIAGNOSIS — N2581 Secondary hyperparathyroidism of renal origin: Secondary | ICD-10-CM | POA: Diagnosis not present

## 2020-04-13 DIAGNOSIS — Z992 Dependence on renal dialysis: Secondary | ICD-10-CM | POA: Diagnosis not present

## 2020-04-13 DIAGNOSIS — D689 Coagulation defect, unspecified: Secondary | ICD-10-CM | POA: Diagnosis not present

## 2020-04-13 DIAGNOSIS — E875 Hyperkalemia: Secondary | ICD-10-CM | POA: Diagnosis not present

## 2020-04-15 DIAGNOSIS — E875 Hyperkalemia: Secondary | ICD-10-CM | POA: Diagnosis not present

## 2020-04-15 DIAGNOSIS — N186 End stage renal disease: Secondary | ICD-10-CM | POA: Diagnosis not present

## 2020-04-15 DIAGNOSIS — N2581 Secondary hyperparathyroidism of renal origin: Secondary | ICD-10-CM | POA: Diagnosis not present

## 2020-04-15 DIAGNOSIS — D689 Coagulation defect, unspecified: Secondary | ICD-10-CM | POA: Diagnosis not present

## 2020-04-15 DIAGNOSIS — Z992 Dependence on renal dialysis: Secondary | ICD-10-CM | POA: Diagnosis not present

## 2020-04-18 DIAGNOSIS — Z992 Dependence on renal dialysis: Secondary | ICD-10-CM | POA: Diagnosis not present

## 2020-04-18 DIAGNOSIS — Z7682 Awaiting organ transplant status: Secondary | ICD-10-CM | POA: Diagnosis not present

## 2020-04-18 DIAGNOSIS — N2581 Secondary hyperparathyroidism of renal origin: Secondary | ICD-10-CM | POA: Diagnosis not present

## 2020-04-18 DIAGNOSIS — D509 Iron deficiency anemia, unspecified: Secondary | ICD-10-CM | POA: Diagnosis not present

## 2020-04-18 DIAGNOSIS — D689 Coagulation defect, unspecified: Secondary | ICD-10-CM | POA: Diagnosis not present

## 2020-04-18 DIAGNOSIS — N186 End stage renal disease: Secondary | ICD-10-CM | POA: Diagnosis not present

## 2020-04-18 DIAGNOSIS — D631 Anemia in chronic kidney disease: Secondary | ICD-10-CM | POA: Diagnosis not present

## 2020-04-20 DIAGNOSIS — D509 Iron deficiency anemia, unspecified: Secondary | ICD-10-CM | POA: Diagnosis not present

## 2020-04-20 DIAGNOSIS — D631 Anemia in chronic kidney disease: Secondary | ICD-10-CM | POA: Diagnosis not present

## 2020-04-20 DIAGNOSIS — N186 End stage renal disease: Secondary | ICD-10-CM | POA: Diagnosis not present

## 2020-04-20 DIAGNOSIS — Z992 Dependence on renal dialysis: Secondary | ICD-10-CM | POA: Diagnosis not present

## 2020-04-20 DIAGNOSIS — D689 Coagulation defect, unspecified: Secondary | ICD-10-CM | POA: Diagnosis not present

## 2020-04-20 DIAGNOSIS — Z7682 Awaiting organ transplant status: Secondary | ICD-10-CM | POA: Diagnosis not present

## 2020-04-20 DIAGNOSIS — N2581 Secondary hyperparathyroidism of renal origin: Secondary | ICD-10-CM | POA: Diagnosis not present

## 2020-04-22 DIAGNOSIS — D689 Coagulation defect, unspecified: Secondary | ICD-10-CM | POA: Diagnosis not present

## 2020-04-22 DIAGNOSIS — Z7682 Awaiting organ transplant status: Secondary | ICD-10-CM | POA: Diagnosis not present

## 2020-04-22 DIAGNOSIS — D509 Iron deficiency anemia, unspecified: Secondary | ICD-10-CM | POA: Diagnosis not present

## 2020-04-22 DIAGNOSIS — N2581 Secondary hyperparathyroidism of renal origin: Secondary | ICD-10-CM | POA: Diagnosis not present

## 2020-04-22 DIAGNOSIS — Z992 Dependence on renal dialysis: Secondary | ICD-10-CM | POA: Diagnosis not present

## 2020-04-22 DIAGNOSIS — Z23 Encounter for immunization: Secondary | ICD-10-CM | POA: Diagnosis not present

## 2020-04-22 DIAGNOSIS — N186 End stage renal disease: Secondary | ICD-10-CM | POA: Diagnosis not present

## 2020-04-22 DIAGNOSIS — D631 Anemia in chronic kidney disease: Secondary | ICD-10-CM | POA: Diagnosis not present

## 2020-04-25 DIAGNOSIS — M75101 Unspecified rotator cuff tear or rupture of right shoulder, not specified as traumatic: Secondary | ICD-10-CM | POA: Diagnosis not present

## 2020-04-25 DIAGNOSIS — D689 Coagulation defect, unspecified: Secondary | ICD-10-CM | POA: Diagnosis not present

## 2020-04-25 DIAGNOSIS — Z992 Dependence on renal dialysis: Secondary | ICD-10-CM | POA: Diagnosis not present

## 2020-04-25 DIAGNOSIS — N2581 Secondary hyperparathyroidism of renal origin: Secondary | ICD-10-CM | POA: Diagnosis not present

## 2020-04-25 DIAGNOSIS — D631 Anemia in chronic kidney disease: Secondary | ICD-10-CM | POA: Diagnosis not present

## 2020-04-25 DIAGNOSIS — N186 End stage renal disease: Secondary | ICD-10-CM | POA: Diagnosis not present

## 2020-04-25 DIAGNOSIS — D509 Iron deficiency anemia, unspecified: Secondary | ICD-10-CM | POA: Diagnosis not present

## 2020-04-26 ENCOUNTER — Ambulatory Visit: Payer: Medicare Other | Admitting: Cardiology

## 2020-04-29 DIAGNOSIS — D631 Anemia in chronic kidney disease: Secondary | ICD-10-CM | POA: Diagnosis not present

## 2020-04-29 DIAGNOSIS — N2581 Secondary hyperparathyroidism of renal origin: Secondary | ICD-10-CM | POA: Diagnosis not present

## 2020-04-29 DIAGNOSIS — D509 Iron deficiency anemia, unspecified: Secondary | ICD-10-CM | POA: Diagnosis not present

## 2020-04-29 DIAGNOSIS — N186 End stage renal disease: Secondary | ICD-10-CM | POA: Diagnosis not present

## 2020-04-29 DIAGNOSIS — D689 Coagulation defect, unspecified: Secondary | ICD-10-CM | POA: Diagnosis not present

## 2020-04-29 DIAGNOSIS — Z992 Dependence on renal dialysis: Secondary | ICD-10-CM | POA: Diagnosis not present

## 2020-04-30 DIAGNOSIS — Z992 Dependence on renal dialysis: Secondary | ICD-10-CM | POA: Diagnosis not present

## 2020-04-30 DIAGNOSIS — N2581 Secondary hyperparathyroidism of renal origin: Secondary | ICD-10-CM | POA: Diagnosis not present

## 2020-04-30 DIAGNOSIS — N186 End stage renal disease: Secondary | ICD-10-CM | POA: Diagnosis not present

## 2020-04-30 DIAGNOSIS — E8770 Fluid overload, unspecified: Secondary | ICD-10-CM | POA: Diagnosis not present

## 2020-05-02 ENCOUNTER — Ambulatory Visit (INDEPENDENT_AMBULATORY_CARE_PROVIDER_SITE_OTHER): Payer: Medicare Other | Admitting: *Deleted

## 2020-05-02 ENCOUNTER — Other Ambulatory Visit: Payer: Self-pay

## 2020-05-02 DIAGNOSIS — D509 Iron deficiency anemia, unspecified: Secondary | ICD-10-CM | POA: Diagnosis not present

## 2020-05-02 DIAGNOSIS — M2041 Other hammer toe(s) (acquired), right foot: Secondary | ICD-10-CM | POA: Diagnosis not present

## 2020-05-02 DIAGNOSIS — M21619 Bunion of unspecified foot: Secondary | ICD-10-CM | POA: Diagnosis not present

## 2020-05-02 DIAGNOSIS — M2141 Flat foot [pes planus] (acquired), right foot: Secondary | ICD-10-CM

## 2020-05-02 DIAGNOSIS — D631 Anemia in chronic kidney disease: Secondary | ICD-10-CM | POA: Diagnosis not present

## 2020-05-02 DIAGNOSIS — N186 End stage renal disease: Secondary | ICD-10-CM | POA: Diagnosis not present

## 2020-05-02 DIAGNOSIS — E1149 Type 2 diabetes mellitus with other diabetic neurological complication: Secondary | ICD-10-CM

## 2020-05-02 DIAGNOSIS — M2142 Flat foot [pes planus] (acquired), left foot: Secondary | ICD-10-CM | POA: Diagnosis not present

## 2020-05-02 DIAGNOSIS — D689 Coagulation defect, unspecified: Secondary | ICD-10-CM | POA: Diagnosis not present

## 2020-05-02 DIAGNOSIS — E875 Hyperkalemia: Secondary | ICD-10-CM | POA: Diagnosis not present

## 2020-05-02 DIAGNOSIS — Z992 Dependence on renal dialysis: Secondary | ICD-10-CM | POA: Diagnosis not present

## 2020-05-02 DIAGNOSIS — R519 Headache, unspecified: Secondary | ICD-10-CM | POA: Diagnosis not present

## 2020-05-02 DIAGNOSIS — M2042 Other hammer toe(s) (acquired), left foot: Secondary | ICD-10-CM

## 2020-05-02 DIAGNOSIS — N2581 Secondary hyperparathyroidism of renal origin: Secondary | ICD-10-CM | POA: Diagnosis not present

## 2020-05-02 NOTE — Progress Notes (Signed)
Patient presents today to pick up diabetic shoes and insoles.  Patient was dispensed 1 pair of diabetic shoes and 3 pairs of foam casted diabetic insoles. Fit was satisfactory. Instructions for break-in and wear was reviewed and a copy was given to the patient.   Re-appointment for regularly scheduled diabetic foot care visits or if they should experience any trouble with the shoes or insoles.  

## 2020-05-03 DIAGNOSIS — M25552 Pain in left hip: Secondary | ICD-10-CM | POA: Diagnosis not present

## 2020-05-03 DIAGNOSIS — M25511 Pain in right shoulder: Secondary | ICD-10-CM | POA: Diagnosis not present

## 2020-05-04 DIAGNOSIS — E875 Hyperkalemia: Secondary | ICD-10-CM | POA: Diagnosis not present

## 2020-05-04 DIAGNOSIS — D689 Coagulation defect, unspecified: Secondary | ICD-10-CM | POA: Diagnosis not present

## 2020-05-04 DIAGNOSIS — N2581 Secondary hyperparathyroidism of renal origin: Secondary | ICD-10-CM | POA: Diagnosis not present

## 2020-05-04 DIAGNOSIS — Z992 Dependence on renal dialysis: Secondary | ICD-10-CM | POA: Diagnosis not present

## 2020-05-04 DIAGNOSIS — N186 End stage renal disease: Secondary | ICD-10-CM | POA: Diagnosis not present

## 2020-05-04 DIAGNOSIS — D509 Iron deficiency anemia, unspecified: Secondary | ICD-10-CM | POA: Diagnosis not present

## 2020-05-04 DIAGNOSIS — D631 Anemia in chronic kidney disease: Secondary | ICD-10-CM | POA: Diagnosis not present

## 2020-05-04 DIAGNOSIS — R519 Headache, unspecified: Secondary | ICD-10-CM | POA: Diagnosis not present

## 2020-05-06 ENCOUNTER — Other Ambulatory Visit (HOSPITAL_COMMUNITY): Payer: Self-pay | Admitting: Sports Medicine

## 2020-05-06 DIAGNOSIS — D631 Anemia in chronic kidney disease: Secondary | ICD-10-CM | POA: Diagnosis not present

## 2020-05-06 DIAGNOSIS — N2581 Secondary hyperparathyroidism of renal origin: Secondary | ICD-10-CM | POA: Diagnosis not present

## 2020-05-06 DIAGNOSIS — M25511 Pain in right shoulder: Secondary | ICD-10-CM

## 2020-05-06 DIAGNOSIS — R519 Headache, unspecified: Secondary | ICD-10-CM | POA: Diagnosis not present

## 2020-05-06 DIAGNOSIS — N186 End stage renal disease: Secondary | ICD-10-CM | POA: Diagnosis not present

## 2020-05-06 DIAGNOSIS — E875 Hyperkalemia: Secondary | ICD-10-CM | POA: Diagnosis not present

## 2020-05-06 DIAGNOSIS — D509 Iron deficiency anemia, unspecified: Secondary | ICD-10-CM | POA: Diagnosis not present

## 2020-05-06 DIAGNOSIS — D689 Coagulation defect, unspecified: Secondary | ICD-10-CM | POA: Diagnosis not present

## 2020-05-06 DIAGNOSIS — Z992 Dependence on renal dialysis: Secondary | ICD-10-CM | POA: Diagnosis not present

## 2020-05-09 DIAGNOSIS — N2581 Secondary hyperparathyroidism of renal origin: Secondary | ICD-10-CM | POA: Diagnosis not present

## 2020-05-09 DIAGNOSIS — D689 Coagulation defect, unspecified: Secondary | ICD-10-CM | POA: Diagnosis not present

## 2020-05-09 DIAGNOSIS — Z992 Dependence on renal dialysis: Secondary | ICD-10-CM | POA: Diagnosis not present

## 2020-05-09 DIAGNOSIS — E875 Hyperkalemia: Secondary | ICD-10-CM | POA: Diagnosis not present

## 2020-05-09 DIAGNOSIS — N186 End stage renal disease: Secondary | ICD-10-CM | POA: Diagnosis not present

## 2020-05-11 DIAGNOSIS — D689 Coagulation defect, unspecified: Secondary | ICD-10-CM | POA: Diagnosis not present

## 2020-05-11 DIAGNOSIS — N2581 Secondary hyperparathyroidism of renal origin: Secondary | ICD-10-CM | POA: Diagnosis not present

## 2020-05-11 DIAGNOSIS — N186 End stage renal disease: Secondary | ICD-10-CM | POA: Diagnosis not present

## 2020-05-11 DIAGNOSIS — E875 Hyperkalemia: Secondary | ICD-10-CM | POA: Diagnosis not present

## 2020-05-11 DIAGNOSIS — Z992 Dependence on renal dialysis: Secondary | ICD-10-CM | POA: Diagnosis not present

## 2020-05-12 DIAGNOSIS — I12 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 stage renal disease: Secondary | ICD-10-CM | POA: Diagnosis not present

## 2020-05-12 DIAGNOSIS — Z992 Dependence on renal dialysis: Secondary | ICD-10-CM | POA: Diagnosis not present

## 2020-05-12 DIAGNOSIS — N186 End stage renal disease: Secondary | ICD-10-CM | POA: Diagnosis not present

## 2020-05-13 DIAGNOSIS — N2581 Secondary hyperparathyroidism of renal origin: Secondary | ICD-10-CM | POA: Diagnosis not present

## 2020-05-13 DIAGNOSIS — D631 Anemia in chronic kidney disease: Secondary | ICD-10-CM | POA: Diagnosis not present

## 2020-05-13 DIAGNOSIS — D689 Coagulation defect, unspecified: Secondary | ICD-10-CM | POA: Diagnosis not present

## 2020-05-13 DIAGNOSIS — Z992 Dependence on renal dialysis: Secondary | ICD-10-CM | POA: Diagnosis not present

## 2020-05-13 DIAGNOSIS — N186 End stage renal disease: Secondary | ICD-10-CM | POA: Diagnosis not present

## 2020-05-16 DIAGNOSIS — D509 Iron deficiency anemia, unspecified: Secondary | ICD-10-CM | POA: Diagnosis not present

## 2020-05-16 DIAGNOSIS — Z992 Dependence on renal dialysis: Secondary | ICD-10-CM | POA: Diagnosis not present

## 2020-05-16 DIAGNOSIS — D689 Coagulation defect, unspecified: Secondary | ICD-10-CM | POA: Diagnosis not present

## 2020-05-16 DIAGNOSIS — N186 End stage renal disease: Secondary | ICD-10-CM | POA: Diagnosis not present

## 2020-05-16 DIAGNOSIS — N2581 Secondary hyperparathyroidism of renal origin: Secondary | ICD-10-CM | POA: Diagnosis not present

## 2020-05-16 DIAGNOSIS — E875 Hyperkalemia: Secondary | ICD-10-CM | POA: Diagnosis not present

## 2020-05-16 DIAGNOSIS — D631 Anemia in chronic kidney disease: Secondary | ICD-10-CM | POA: Diagnosis not present

## 2020-05-17 ENCOUNTER — Ambulatory Visit (HOSPITAL_COMMUNITY)
Admission: RE | Admit: 2020-05-17 | Discharge: 2020-05-17 | Disposition: A | Discharge status: Home / Self Care | Payer: Medicare Other | Source: Ambulatory Visit | Attending: Sports Medicine | Admitting: Sports Medicine

## 2020-05-17 ENCOUNTER — Other Ambulatory Visit: Payer: Self-pay

## 2020-05-17 DIAGNOSIS — M25511 Pain in right shoulder: Secondary | ICD-10-CM | POA: Insufficient documentation

## 2020-05-19 ENCOUNTER — Ambulatory Visit (INDEPENDENT_AMBULATORY_CARE_PROVIDER_SITE_OTHER): Payer: Medicare Other | Admitting: Orthopaedic Surgery

## 2020-05-19 ENCOUNTER — Other Ambulatory Visit: Payer: Self-pay

## 2020-05-19 ENCOUNTER — Ambulatory Visit: Payer: Self-pay

## 2020-05-19 ENCOUNTER — Encounter: Payer: Self-pay | Admitting: Orthopaedic Surgery

## 2020-05-19 DIAGNOSIS — M545 Low back pain, unspecified: Secondary | ICD-10-CM

## 2020-05-19 DIAGNOSIS — M12811 Other specific arthropathies, not elsewhere classified, right shoulder: Secondary | ICD-10-CM

## 2020-05-19 DIAGNOSIS — M1612 Unilateral primary osteoarthritis, left hip: Secondary | ICD-10-CM

## 2020-05-19 NOTE — Progress Notes (Signed)
Office Visit Note   Patient: Carl Pintor Sr.           Date of Birth: 1961-02-02           MRN: [id number] Visit Date: 05/19/2020              Requested by: [name], MD [place],  [place] [address] PCP: [name], MD   Assessment & Plan: Visit Diagnoses:  1. Primary osteoarthritis of left hip   2. Low back pain, unspecified back pain laterality, unspecified chronicity, unspecified whether sciatica present   3. Rotator cuff arthropathy of right shoulder     Plan: For the right shoulder I reviewed the MRI which shows findings consistent with chronic rotator cuff arthropathy.  He has fatty atrophy of the rotator cuff.  Cortisone injections and physical therapy offered which she will think about.  For the left hip he has end-stage degenerative joint disease and this is affecting him more severely than the shoulder because he has very limited ability to perform ADLs and he has severe and chronic pain.  He has failed conservative treatments such as medications, home exercise strengthening program and activity modifications.  Due to the lack of improvement from conservative treatments he has elected for a left total hip replacement in the near future.  We will obtain the necessary clearances from his cardiologist and PCP.  He has dialysis every Monday Wednesday Friday so will we will plan for surgery either on a Tuesday or Thursday at [facility].  Risk benefits rehab recovery of the hip replacement reviewed with the patient detail.  Follow-Up Instructions: Return if symptoms worsen or fail to improve.   Orders:  Orders Placed This Encounter  Procedures  . XR HIP UNILAT W OR W/O PELVIS 2-3 VIEWS LEFT  . XR Lumbar Spine 2-3 Views   No orders of the defined types were placed in this encounter.     Procedures: No procedures performed   Clinical Data: No additional findings.   Subjective: Chief Complaint  Patient presents with  .  Right Shoulder - Pain  . Left Hip - Pain    [name] is a very pleasant 60 year old gentleman who comes in for evaluation of of chronic left hip and groin pain and chronic right shoulder pain.  In regards to the left hip he experiences a severe groin pain that radiates into the back.  He takes Tylenol for the pain but the relief is temporary.  He has been on hemodialysis for 25 years.  He has tried home exercise program in the past for his hips but they did not provide him with much relief.  In terms of the right shoulder he has chronic pain and weakness with elevation of the arm.  He denies any injuries or prior surgeries.  Denies any numbness and tingling.  Or radicular symptoms.   Review of Systems  Constitutional: Negative.   All other systems reviewed and are negative.    Objective: Vital Signs: There were no vitals taken for this visit.  Physical Exam Vitals and nursing note reviewed.  Constitutional:      Appearance: He is well-developed.  HENT:     Head: Normocephalic and atraumatic.  Eyes:     Pupils: Pupils are equal, round, and reactive to light.  Pulmonary:     Effort: Pulmonary effort is normal.  Abdominal:     Palpations: Abdomen is soft.  Musculoskeletal:  General: Normal range of motion.     Cervical back: Neck supple.  Skin:    General: Skin is warm.  Neurological:     Mental Status: He is alert and oriented to person, place, and time.  Psychiatric:        Behavior: Behavior normal.        Thought Content: Thought content normal.        Judgment: Judgment normal.     Ortho Exam Right shoulder exam shows very limited active range of motion consistent with chronic rotator cuff insufficiency.  Passive range of motion is mildly restricted.  Left hip shows a significant pain with range of motion.  Limited internal and external rotation.  Walks with antalgic gait.  No sciatic tension signs.  Lateral hip is minimally tender. Specialty Comments:  No  specialty comments available.  Imaging: XR HIP UNILAT W OR W/O PELVIS 2-3 VIEWS LEFT  Result Date: 05/19/2020 Advanced degenerative changes of both hip joints.  Large right femoral head cyst.  XR Lumbar Spine 2-3 Views  Result Date: 05/19/2020 Degenerative scoliosis.  Multilevel degenerative disc disease.  No acute abnormalities.    PMFS History: Patient Active Problem List   Diagnosis Date Noted  . Diverticulitis 03/08/2020  . ESRD on hemodialysis ([place]) 03/08/2020  . Polyneuropathy in diseases classified elsewhere ([place]) 03/08/2020  . Pure hypercholesterolemia 03/08/2020  . Sacral back pain 03/08/2020  . 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 Acute gastric ulcer with hemorrhage   . Acute GI bleeding 07/31/2019  . Hypotension of hemodialysis 07/31/2019  . Diverticulitis large intestine 07/31/2019  . Mass of soft tissue of shoulder 05/26/2019  . Awaiting organ transplant status 03/18/2019  . Finding of cocaine in blood 02/25/2019  . Anaphylactic shock, unspecified, sequela 11/21/2018  . Hypercalcemia 10/28/2018  . Diarrhea, unspecified 08/08/2018  . Hyperkalemia 03/12/2018  . Patient is Jehovah's Witness 01/30/2018  . Renal cell carcinoma of left kidney ([place]) 12/24/2017  . Fluid overload, unspecified 07/06/2017  . 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 Other specified disorders of kidney and ureter 11/22/2016  . Secondary hyperparathyroidism of renal origin ([place]) 10/23/2016  . Other headache syndrome 08/21/2016  . TIA (transient ischemic attack)   . Essential hypertension   . Seizures ([place])   . Acute pericarditis   . Atypical chest pain 10/01/2015  . Abnormal EKG 10/01/2015  . 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 Lung nodule   . Diverticulitis of cecum 07/21/2015  . Diverticulitis of large intestine without perforation or abscess without bleeding   . Right lower quadrant pain   . Headache 05/05/2015  . Obstructive sleep apnea  02/08/2015  . 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 Hypertension secondary to other renal disorders 08/26/2014  . Diverticulosis 07/13/2014  . Lower GI bleed 07/12/2014  . Hematochezia   . Refusal of blood transfusions as patient is Jehovah's Witness   . Parotid mass   . 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 HLD (hyperlipidemia)   . PRES (posterior reversible encephalopathy syndrome)   . History of lacunar cerebrovascular accident (CVA) 05/23/2014  . Cerebral infarction ([place]) 05/23/2014  . Anemia of renal disease 05/23/2014  . Encounter for fitting and adjustment of extracorporeal dialysis catheter ([place]) 05/07/2014  . Shortness of breath 01/15/2013  . HTN (hypertension) 12/09/2012  . GERD (gastroesophageal reflux disease) 12/09/2012  . ESRD on dialysis ([place]) 12/09/2012  . Gastrointestinal bleeding 11/12/2012  . Melena 11/12/2012  . Other  psychoactive substance dependence, uncomplicated ([place]) 60/11/9321  . Iron deficiency anemia, unspecified 06/10/2012  . Moderate protein-calorie malnutrition ([place]) 07/29/2009  . Coagulation defect, unspecified ([place]) 08/29/2006  . Type 2 diabetes mellitus with diabetic peripheral angiopathy without gangrene ([place]) 08/29/2006  . 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 stage renal disease ([place]) 08/02/2006   Past Medical History:  Diagnosis Date  . Anemia    hx of  . Anxiety    situational   . Arthritis    on meds  . Borderline diabetes   . Cataract    bilateral sx  . Chronic headaches   . Depression    situational   . Diverticulitis   . ESRD (end stage renal disease) ([place])     On Renal Transplant List," [facility]; MWF" (10/23/2016)  . GERD (gastroesophageal reflux disease)    with certain foods  . GI bleed   . Hypertension    diet controlled  . Parathyroid abnormality (HCC)    ectopic parathyroid gland  . Presence of arteriovenous fistula for  hemodialysis, primary ([place])    RUE  . Refusal of blood product    NO WHOLE BLOOD PROUCTS  . Secondary hyperparathyroidism ([place])   . Seizures ([place])    one episode in past  . Sleep apnea    doesn't use CPAP anymore since weight loss  . Stroke [facility])    no residual    Family History  Problem Relation Age of Onset  . Diabetes Father   . Stroke Father   . Hypertension Father   . Uterine cancer Mother   . Lupus Sister   . Stroke Sister   . Hypertension Sister   . Anuerysm Brother        brain  . Colon cancer Neg Hx   . Esophageal cancer Neg Hx   . Stomach cancer Neg Hx   . Pancreatic cancer Neg Hx   . Liver disease Neg Hx   . Colon polyps Neg Hx   . Rectal cancer Neg Hx     Past Surgical History:  Procedure Laterality Date  . AV FISTULA PLACEMENT Right    right arm  . BIOPSY  08/01/2019   Procedure: BIOPSY;  Surgeon: Juanita Craver, MD;  Location: [place];  Service: Endoscopy;;  . CATARACT EXTRACTION W/ INTRAOCULAR LENS  IMPLANT, BILATERAL    . COLON SURGERY    . COLONOSCOPY N/A 08/04/2015   Procedure: COLONOSCOPY;  Surgeon: Jerene Bears, MD;  Location: [facility] ENDOSCOPY;  Service: Endoscopy;  Laterality: N/A;  . COLONOSCOPY  2017   JMP@ [facility]-good prep-mass -recall 1 yr  . ESOPHAGOGASTRODUODENOSCOPY N/A 08/01/2019   Procedure: ESOPHAGOGASTRODUODENOSCOPY (EGD);  Surgeon: Juanita Craver, MD;  Location: [facility] ENDOSCOPY;  Service: Endoscopy;  Laterality: N/A;  . ESOPHAGOGASTRODUODENOSCOPY (EGD) WITH PROPOFOL N/A 08/04/2019   Procedure: ESOPHAGOGASTRODUODENOSCOPY (EGD) WITH PROPOFOL;  Surgeon: Yetta Flock, MD;  Location: [place];  Service: Gastroenterology;  Laterality: N/A;  . graft left arm Left    for dialysis x 2  . INSERTION OF DIALYSIS CATHETER     Rt chest  . LAPAROSCOPIC RIGHT COLECTOMY N/A 08/05/2015   Procedure: LAPAROSCOPIC RIGHT COLECTOMY- ASCENDING;  Surgeon: Stark Klein, MD;  Location: [place];  Service: General;  Laterality: N/A;  . MASS EXCISION Left  05/28/2019   Procedure: EXCISION SOFT TISSUE MASS LEFT SHOULDER;  Surgeon: Armandina Gemma, MD;  [facility];  Service: General;  Laterality: Left;  . NEPHRECTOMY Bilateral   . PARATHYROIDECTOMY  N/A 06/12/2016   Procedure: TOTAL PARATHYROIDECTOMY WITH AUTOTRANSPLANTATION TO LEFT FOREARM;  Surgeon: Armandina Gemma, MD;  Location: [place];  Service: General;  Laterality: N/A;  . PARATHYROIDECTOMY  10/23/2016  . PARATHYROIDECTOMY N/A 10/23/2016   Procedure: PARATHYROIDECTOMY;  Surgeon: Armandina Gemma, MD;  Location: [place];  Service: General;  Laterality: N/A;  . REVISON OF ARTERIOVENOUS FISTULA Right 07/16/2017   Procedure: REVISION OF ARTERIOVENOUS FISTULA  Right ARM;  Surgeon: Waynetta Sandy, MD;  Location: [place];  Service: Vascular;  Laterality: Right;  . UPPER GASTROINTESTINAL ENDOSCOPY  2021   @ [facility]   Social History   Occupational History  . Occupation: retired    Fish farm manager: SELF EMPLOYED  Tobacco Use  . Smoking status: Former Smoker    Quit date: 06/19/1990    Years since quitting: 29.9  . Smokeless tobacco: Never Used  . Tobacco comment: rarely cigar  Vaping Use  . Vaping Use: Never used  Substance and Sexual Activity  . Alcohol use: Yes    Alcohol/week: 1.0 standard drink    Types: 1 Cans of beer per week    Comment: occasional  . Drug use: Not Currently    Types: Marijuana  . Sexual activity: Not on file

## 2020-05-20 DIAGNOSIS — Z992 Dependence on renal dialysis: Secondary | ICD-10-CM | POA: Diagnosis not present

## 2020-05-20 DIAGNOSIS — E875 Hyperkalemia: Secondary | ICD-10-CM | POA: Diagnosis not present

## 2020-05-20 DIAGNOSIS — D509 Iron deficiency anemia, unspecified: Secondary | ICD-10-CM | POA: Diagnosis not present

## 2020-05-20 DIAGNOSIS — D689 Coagulation defect, unspecified: Secondary | ICD-10-CM | POA: Diagnosis not present

## 2020-05-20 DIAGNOSIS — D631 Anemia in chronic kidney disease: Secondary | ICD-10-CM | POA: Diagnosis not present

## 2020-05-20 DIAGNOSIS — N2581 Secondary hyperparathyroidism of renal origin: Secondary | ICD-10-CM | POA: Diagnosis not present

## 2020-05-20 DIAGNOSIS — N186 End stage renal disease: Secondary | ICD-10-CM | POA: Diagnosis not present

## 2020-05-23 DIAGNOSIS — Z992 Dependence on renal dialysis: Secondary | ICD-10-CM | POA: Diagnosis not present

## 2020-05-23 DIAGNOSIS — Z7682 Awaiting organ transplant status: Secondary | ICD-10-CM | POA: Diagnosis not present

## 2020-05-23 DIAGNOSIS — D631 Anemia in chronic kidney disease: Secondary | ICD-10-CM | POA: Diagnosis not present

## 2020-05-23 DIAGNOSIS — D689 Coagulation defect, unspecified: Secondary | ICD-10-CM | POA: Diagnosis not present

## 2020-05-23 DIAGNOSIS — E1129 Type 2 diabetes mellitus with other diabetic kidney complication: Secondary | ICD-10-CM | POA: Diagnosis not present

## 2020-05-23 DIAGNOSIS — D509 Iron deficiency anemia, unspecified: Secondary | ICD-10-CM | POA: Diagnosis not present

## 2020-05-23 DIAGNOSIS — N2581 Secondary hyperparathyroidism of renal origin: Secondary | ICD-10-CM | POA: Diagnosis not present

## 2020-05-23 DIAGNOSIS — N186 End stage renal disease: Secondary | ICD-10-CM | POA: Diagnosis not present

## 2020-05-25 DIAGNOSIS — D631 Anemia in chronic kidney disease: Secondary | ICD-10-CM | POA: Diagnosis not present

## 2020-05-25 DIAGNOSIS — D689 Coagulation defect, unspecified: Secondary | ICD-10-CM | POA: Diagnosis not present

## 2020-05-25 DIAGNOSIS — N2581 Secondary hyperparathyroidism of renal origin: Secondary | ICD-10-CM | POA: Diagnosis not present

## 2020-05-25 DIAGNOSIS — Z992 Dependence on renal dialysis: Secondary | ICD-10-CM | POA: Diagnosis not present

## 2020-05-25 DIAGNOSIS — E1129 Type 2 diabetes mellitus with other diabetic kidney complication: Secondary | ICD-10-CM | POA: Diagnosis not present

## 2020-05-25 DIAGNOSIS — D509 Iron deficiency anemia, unspecified: Secondary | ICD-10-CM | POA: Diagnosis not present

## 2020-05-25 DIAGNOSIS — N186 End stage renal disease: Secondary | ICD-10-CM | POA: Diagnosis not present

## 2020-05-25 DIAGNOSIS — Z7682 Awaiting organ transplant status: Secondary | ICD-10-CM | POA: Diagnosis not present

## 2020-05-27 DIAGNOSIS — N2581 Secondary hyperparathyroidism of renal origin: Secondary | ICD-10-CM | POA: Diagnosis not present

## 2020-05-27 DIAGNOSIS — Z992 Dependence on renal dialysis: Secondary | ICD-10-CM | POA: Diagnosis not present

## 2020-05-27 DIAGNOSIS — D509 Iron deficiency anemia, unspecified: Secondary | ICD-10-CM | POA: Diagnosis not present

## 2020-05-27 DIAGNOSIS — D631 Anemia in chronic kidney disease: Secondary | ICD-10-CM | POA: Diagnosis not present

## 2020-05-27 DIAGNOSIS — Z7682 Awaiting organ transplant status: Secondary | ICD-10-CM | POA: Diagnosis not present

## 2020-05-27 DIAGNOSIS — E1129 Type 2 diabetes mellitus with other diabetic kidney complication: Secondary | ICD-10-CM | POA: Diagnosis not present

## 2020-05-27 DIAGNOSIS — D689 Coagulation defect, unspecified: Secondary | ICD-10-CM | POA: Diagnosis not present

## 2020-05-27 DIAGNOSIS — N186 End stage renal disease: Secondary | ICD-10-CM | POA: Diagnosis not present

## 2020-05-31 DIAGNOSIS — N186 End stage renal disease: Secondary | ICD-10-CM | POA: Diagnosis not present

## 2020-05-31 DIAGNOSIS — D631 Anemia in chronic kidney disease: Secondary | ICD-10-CM | POA: Diagnosis not present

## 2020-05-31 DIAGNOSIS — D689 Coagulation defect, unspecified: Secondary | ICD-10-CM | POA: Diagnosis not present

## 2020-05-31 DIAGNOSIS — E875 Hyperkalemia: Secondary | ICD-10-CM | POA: Diagnosis not present

## 2020-05-31 DIAGNOSIS — Z992 Dependence on renal dialysis: Secondary | ICD-10-CM | POA: Diagnosis not present

## 2020-05-31 DIAGNOSIS — D509 Iron deficiency anemia, unspecified: Secondary | ICD-10-CM | POA: Diagnosis not present

## 2020-05-31 DIAGNOSIS — N2581 Secondary hyperparathyroidism of renal origin: Secondary | ICD-10-CM | POA: Diagnosis not present

## 2020-06-01 DIAGNOSIS — E875 Hyperkalemia: Secondary | ICD-10-CM | POA: Diagnosis not present

## 2020-06-01 DIAGNOSIS — N186 End stage renal disease: Secondary | ICD-10-CM | POA: Diagnosis not present

## 2020-06-01 DIAGNOSIS — N2581 Secondary hyperparathyroidism of renal origin: Secondary | ICD-10-CM | POA: Diagnosis not present

## 2020-06-01 DIAGNOSIS — Z992 Dependence on renal dialysis: Secondary | ICD-10-CM | POA: Diagnosis not present

## 2020-06-01 DIAGNOSIS — D689 Coagulation defect, unspecified: Secondary | ICD-10-CM | POA: Diagnosis not present

## 2020-06-01 DIAGNOSIS — D509 Iron deficiency anemia, unspecified: Secondary | ICD-10-CM | POA: Diagnosis not present

## 2020-06-01 DIAGNOSIS — D631 Anemia in chronic kidney disease: Secondary | ICD-10-CM | POA: Diagnosis not present

## 2020-06-03 DIAGNOSIS — D631 Anemia in chronic kidney disease: Secondary | ICD-10-CM | POA: Diagnosis not present

## 2020-06-03 DIAGNOSIS — D509 Iron deficiency anemia, unspecified: Secondary | ICD-10-CM | POA: Diagnosis not present

## 2020-06-03 DIAGNOSIS — N2581 Secondary hyperparathyroidism of renal origin: Secondary | ICD-10-CM | POA: Diagnosis not present

## 2020-06-03 DIAGNOSIS — E875 Hyperkalemia: Secondary | ICD-10-CM | POA: Diagnosis not present

## 2020-06-03 DIAGNOSIS — Z992 Dependence on renal dialysis: Secondary | ICD-10-CM | POA: Diagnosis not present

## 2020-06-03 DIAGNOSIS — M25511 Pain in right shoulder: Secondary | ICD-10-CM | POA: Diagnosis not present

## 2020-06-03 DIAGNOSIS — G8929 Other chronic pain: Secondary | ICD-10-CM | POA: Diagnosis not present

## 2020-06-03 DIAGNOSIS — M1612 Unilateral primary osteoarthritis, left hip: Secondary | ICD-10-CM | POA: Diagnosis not present

## 2020-06-03 DIAGNOSIS — N186 End stage renal disease: Secondary | ICD-10-CM | POA: Diagnosis not present

## 2020-06-03 DIAGNOSIS — D689 Coagulation defect, unspecified: Secondary | ICD-10-CM | POA: Diagnosis not present

## 2020-06-06 DIAGNOSIS — Z992 Dependence on renal dialysis: Secondary | ICD-10-CM | POA: Diagnosis not present

## 2020-06-06 DIAGNOSIS — N186 End stage renal disease: Secondary | ICD-10-CM | POA: Diagnosis not present

## 2020-06-06 DIAGNOSIS — N2581 Secondary hyperparathyroidism of renal origin: Secondary | ICD-10-CM | POA: Diagnosis not present

## 2020-06-06 DIAGNOSIS — D689 Coagulation defect, unspecified: Secondary | ICD-10-CM | POA: Diagnosis not present

## 2020-06-06 DIAGNOSIS — D631 Anemia in chronic kidney disease: Secondary | ICD-10-CM | POA: Diagnosis not present

## 2020-06-06 DIAGNOSIS — E875 Hyperkalemia: Secondary | ICD-10-CM | POA: Diagnosis not present

## 2020-06-08 DIAGNOSIS — N2581 Secondary hyperparathyroidism of renal origin: Secondary | ICD-10-CM | POA: Diagnosis not present

## 2020-06-08 DIAGNOSIS — D689 Coagulation defect, unspecified: Secondary | ICD-10-CM | POA: Diagnosis not present

## 2020-06-08 DIAGNOSIS — E875 Hyperkalemia: Secondary | ICD-10-CM | POA: Diagnosis not present

## 2020-06-08 DIAGNOSIS — N186 End stage renal disease: Secondary | ICD-10-CM | POA: Diagnosis not present

## 2020-06-08 DIAGNOSIS — D631 Anemia in chronic kidney disease: Secondary | ICD-10-CM | POA: Diagnosis not present

## 2020-06-08 DIAGNOSIS — Z992 Dependence on renal dialysis: Secondary | ICD-10-CM | POA: Diagnosis not present

## 2020-06-10 DIAGNOSIS — M1612 Unilateral primary osteoarthritis, left hip: Secondary | ICD-10-CM | POA: Diagnosis not present

## 2020-06-10 DIAGNOSIS — E875 Hyperkalemia: Secondary | ICD-10-CM | POA: Diagnosis not present

## 2020-06-10 DIAGNOSIS — Z992 Dependence on renal dialysis: Secondary | ICD-10-CM | POA: Diagnosis not present

## 2020-06-10 DIAGNOSIS — D689 Coagulation defect, unspecified: Secondary | ICD-10-CM | POA: Diagnosis not present

## 2020-06-10 DIAGNOSIS — N2581 Secondary hyperparathyroidism of renal origin: Secondary | ICD-10-CM | POA: Diagnosis not present

## 2020-06-10 DIAGNOSIS — N186 End stage renal disease: Secondary | ICD-10-CM | POA: Diagnosis not present

## 2020-06-10 DIAGNOSIS — D631 Anemia in chronic kidney disease: Secondary | ICD-10-CM | POA: Diagnosis not present

## 2020-06-11 DIAGNOSIS — Z992 Dependence on renal dialysis: Secondary | ICD-10-CM | POA: Diagnosis not present

## 2020-06-11 DIAGNOSIS — N186 End stage renal disease: Secondary | ICD-10-CM | POA: Diagnosis not present

## 2020-06-11 DIAGNOSIS — I12 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 stage renal disease: Secondary | ICD-10-CM | POA: Diagnosis not present

## 2020-06-13 DIAGNOSIS — Z992 Dependence on renal dialysis: Secondary | ICD-10-CM | POA: Diagnosis not present

## 2020-06-13 DIAGNOSIS — D689 Coagulation defect, unspecified: Secondary | ICD-10-CM | POA: Diagnosis not present

## 2020-06-13 DIAGNOSIS — N2581 Secondary hyperparathyroidism of renal origin: Secondary | ICD-10-CM | POA: Diagnosis not present

## 2020-06-13 DIAGNOSIS — N186 End stage renal disease: Secondary | ICD-10-CM | POA: Diagnosis not present

## 2020-06-13 DIAGNOSIS — E875 Hyperkalemia: Secondary | ICD-10-CM | POA: Diagnosis not present

## 2020-06-13 DIAGNOSIS — D509 Iron deficiency anemia, unspecified: Secondary | ICD-10-CM | POA: Diagnosis not present

## 2020-06-13 DIAGNOSIS — D631 Anemia in chronic kidney disease: Secondary | ICD-10-CM | POA: Diagnosis not present

## 2020-06-14 DIAGNOSIS — M25511 Pain in right shoulder: Secondary | ICD-10-CM | POA: Diagnosis not present

## 2020-06-15 ENCOUNTER — Encounter (HOSPITAL_COMMUNITY): Payer: Self-pay | Admitting: Orthopaedic Surgery

## 2020-06-15 DIAGNOSIS — D631 Anemia in chronic kidney disease: Secondary | ICD-10-CM | POA: Diagnosis not present

## 2020-06-15 DIAGNOSIS — Z992 Dependence on renal dialysis: Secondary | ICD-10-CM | POA: Diagnosis not present

## 2020-06-15 DIAGNOSIS — N186 End stage renal disease: Secondary | ICD-10-CM | POA: Diagnosis not present

## 2020-06-15 DIAGNOSIS — D509 Iron deficiency anemia, unspecified: Secondary | ICD-10-CM | POA: Diagnosis not present

## 2020-06-15 DIAGNOSIS — E875 Hyperkalemia: Secondary | ICD-10-CM | POA: Diagnosis not present

## 2020-06-15 DIAGNOSIS — D689 Coagulation defect, unspecified: Secondary | ICD-10-CM | POA: Diagnosis not present

## 2020-06-15 DIAGNOSIS — N2581 Secondary hyperparathyroidism of renal origin: Secondary | ICD-10-CM | POA: Diagnosis not present

## 2020-06-15 NOTE — Progress Notes (Signed)
I met this patient yesterday in clinic as a referral from another provider.  Our clinic notes document our interaction that day.  Of note I called the patient this morning to discuss the fact that he has an AV fistula on his right arm and has dysfunction and an irreparable rotator cuff tear on the right shoulder.  He is extraordinarily debilitated by his right shoulder.  Unfortunately his AV fistula means that he is extraordinarily high risk for massive blood loss and complications during his surgery.  We spoke with the patient's previous vascular surgery [name] who confirmed that we should have vascular aware of the procedure and performed the case at [facility] rather than at [facility] due to vascular surgery availability.  We discussed with [name] that his risk for significant blood loss, loss of his AV fistula and potentially death were significantly increased compared to a standard patient.  We quoted that about 1 and 20 times approximately the cephalic vein is injured during the procedure an atypical case and this would be a significant issue for him if it was to happen.  Obviously we will take great measures to avoid injury to the cephalic vein but it is still possible.  Complicating [name] further the patient refuses blood products and even in light of the idea that he would expire without blood products he is still unwilling to receive blood products.  The patient expressed understanding of the complicating factors in regards to his case but still wants to proceed with reverse total shoulder arthroplasty.  We will continue to work on medical clearance and plan for arthroplasty at [facility] with vascular surgery being made aware at the time of his case.

## 2020-06-17 ENCOUNTER — Ambulatory Visit
Admission: RE | Admit: 2020-06-17 | Discharge: 2020-06-17 | Disposition: A | Discharge status: Home / Self Care | Payer: Medicare Other | Source: Ambulatory Visit | Attending: Family Medicine | Admitting: Family Medicine

## 2020-06-17 ENCOUNTER — Other Ambulatory Visit: Payer: Self-pay

## 2020-06-17 ENCOUNTER — Other Ambulatory Visit: Payer: Self-pay | Admitting: Family Medicine

## 2020-06-17 DIAGNOSIS — D689 Coagulation defect, unspecified: Secondary | ICD-10-CM | POA: Diagnosis not present

## 2020-06-17 DIAGNOSIS — M47814 Spondylosis without myelopathy or radiculopathy, thoracic region: Secondary | ICD-10-CM | POA: Diagnosis not present

## 2020-06-17 DIAGNOSIS — M1612 Unilateral primary osteoarthritis, left hip: Secondary | ICD-10-CM | POA: Diagnosis not present

## 2020-06-17 DIAGNOSIS — D631 Anemia in chronic kidney disease: Secondary | ICD-10-CM | POA: Diagnosis not present

## 2020-06-17 DIAGNOSIS — M75121 Complete rotator cuff tear or rupture of right shoulder, not specified as traumatic: Secondary | ICD-10-CM | POA: Diagnosis not present

## 2020-06-17 DIAGNOSIS — Z79899 Other long term (current) drug therapy: Secondary | ICD-10-CM | POA: Diagnosis not present

## 2020-06-17 DIAGNOSIS — Z992 Dependence on renal dialysis: Secondary | ICD-10-CM | POA: Diagnosis not present

## 2020-06-17 DIAGNOSIS — D509 Iron deficiency anemia, unspecified: Secondary | ICD-10-CM | POA: Diagnosis not present

## 2020-06-17 DIAGNOSIS — N2581 Secondary hyperparathyroidism of renal origin: Secondary | ICD-10-CM | POA: Diagnosis not present

## 2020-06-17 DIAGNOSIS — Z01818 Encounter for other preprocedural examination: Secondary | ICD-10-CM | POA: Diagnosis not present

## 2020-06-17 DIAGNOSIS — M419 Scoliosis, unspecified: Secondary | ICD-10-CM | POA: Diagnosis not present

## 2020-06-17 DIAGNOSIS — N186 End stage renal disease: Secondary | ICD-10-CM | POA: Diagnosis not present

## 2020-06-17 DIAGNOSIS — E875 Hyperkalemia: Secondary | ICD-10-CM | POA: Diagnosis not present

## 2020-06-17 DIAGNOSIS — E7800 Pure hypercholesterolemia, unspecified: Secondary | ICD-10-CM | POA: Diagnosis not present

## 2020-06-20 DIAGNOSIS — N186 End stage renal disease: Secondary | ICD-10-CM | POA: Diagnosis not present

## 2020-06-20 DIAGNOSIS — N2581 Secondary hyperparathyroidism of renal origin: Secondary | ICD-10-CM | POA: Diagnosis not present

## 2020-06-20 DIAGNOSIS — R519 Headache, unspecified: Secondary | ICD-10-CM | POA: Diagnosis not present

## 2020-06-20 DIAGNOSIS — Z7682 Awaiting organ transplant status: Secondary | ICD-10-CM | POA: Diagnosis not present

## 2020-06-20 DIAGNOSIS — D689 Coagulation defect, unspecified: Secondary | ICD-10-CM | POA: Diagnosis not present

## 2020-06-20 DIAGNOSIS — M1612 Unilateral primary osteoarthritis, left hip: Secondary | ICD-10-CM | POA: Diagnosis not present

## 2020-06-20 DIAGNOSIS — D631 Anemia in chronic kidney disease: Secondary | ICD-10-CM | POA: Diagnosis not present

## 2020-06-20 DIAGNOSIS — Z992 Dependence on renal dialysis: Secondary | ICD-10-CM | POA: Diagnosis not present

## 2020-06-22 DIAGNOSIS — Z992 Dependence on renal dialysis: Secondary | ICD-10-CM | POA: Diagnosis not present

## 2020-06-22 DIAGNOSIS — Z7682 Awaiting organ transplant status: Secondary | ICD-10-CM | POA: Diagnosis not present

## 2020-06-22 DIAGNOSIS — D689 Coagulation defect, unspecified: Secondary | ICD-10-CM | POA: Diagnosis not present

## 2020-06-22 DIAGNOSIS — D631 Anemia in chronic kidney disease: Secondary | ICD-10-CM | POA: Diagnosis not present

## 2020-06-22 DIAGNOSIS — N2581 Secondary hyperparathyroidism of renal origin: Secondary | ICD-10-CM | POA: Diagnosis not present

## 2020-06-22 DIAGNOSIS — N186 End stage renal disease: Secondary | ICD-10-CM | POA: Diagnosis not present

## 2020-06-22 DIAGNOSIS — R519 Headache, unspecified: Secondary | ICD-10-CM | POA: Diagnosis not present

## 2020-06-24 DIAGNOSIS — Z7682 Awaiting organ transplant status: Secondary | ICD-10-CM | POA: Diagnosis not present

## 2020-06-24 DIAGNOSIS — R519 Headache, unspecified: Secondary | ICD-10-CM | POA: Diagnosis not present

## 2020-06-24 DIAGNOSIS — Z992 Dependence on renal dialysis: Secondary | ICD-10-CM | POA: Diagnosis not present

## 2020-06-24 DIAGNOSIS — D631 Anemia in chronic kidney disease: Secondary | ICD-10-CM | POA: Diagnosis not present

## 2020-06-24 DIAGNOSIS — N2581 Secondary hyperparathyroidism of renal origin: Secondary | ICD-10-CM | POA: Diagnosis not present

## 2020-06-24 DIAGNOSIS — N186 End stage renal disease: Secondary | ICD-10-CM | POA: Diagnosis not present

## 2020-06-24 DIAGNOSIS — D689 Coagulation defect, unspecified: Secondary | ICD-10-CM | POA: Diagnosis not present

## 2020-06-27 DIAGNOSIS — N186 End stage renal disease: Secondary | ICD-10-CM | POA: Diagnosis not present

## 2020-06-27 DIAGNOSIS — E875 Hyperkalemia: Secondary | ICD-10-CM | POA: Diagnosis not present

## 2020-06-27 DIAGNOSIS — D631 Anemia in chronic kidney disease: Secondary | ICD-10-CM | POA: Diagnosis not present

## 2020-06-27 DIAGNOSIS — D509 Iron deficiency anemia, unspecified: Secondary | ICD-10-CM | POA: Diagnosis not present

## 2020-06-27 DIAGNOSIS — N2581 Secondary hyperparathyroidism of renal origin: Secondary | ICD-10-CM | POA: Diagnosis not present

## 2020-06-27 DIAGNOSIS — Z992 Dependence on renal dialysis: Secondary | ICD-10-CM | POA: Diagnosis not present

## 2020-06-27 DIAGNOSIS — D689 Coagulation defect, unspecified: Secondary | ICD-10-CM | POA: Diagnosis not present

## 2020-06-29 DIAGNOSIS — D509 Iron deficiency anemia, unspecified: Secondary | ICD-10-CM | POA: Diagnosis not present

## 2020-06-29 DIAGNOSIS — N186 End stage renal disease: Secondary | ICD-10-CM | POA: Diagnosis not present

## 2020-06-29 DIAGNOSIS — E875 Hyperkalemia: Secondary | ICD-10-CM | POA: Diagnosis not present

## 2020-06-29 DIAGNOSIS — Z992 Dependence on renal dialysis: Secondary | ICD-10-CM | POA: Diagnosis not present

## 2020-06-29 DIAGNOSIS — D631 Anemia in chronic kidney disease: Secondary | ICD-10-CM | POA: Diagnosis not present

## 2020-06-29 DIAGNOSIS — N2581 Secondary hyperparathyroidism of renal origin: Secondary | ICD-10-CM | POA: Diagnosis not present

## 2020-06-29 DIAGNOSIS — D689 Coagulation defect, unspecified: Secondary | ICD-10-CM | POA: Diagnosis not present

## 2020-06-30 DIAGNOSIS — Z79891 Long term (current) use of opiate analgesic: Secondary | ICD-10-CM | POA: Diagnosis not present

## 2020-07-01 DIAGNOSIS — N2581 Secondary hyperparathyroidism of renal origin: Secondary | ICD-10-CM | POA: Diagnosis not present

## 2020-07-01 DIAGNOSIS — D631 Anemia in chronic kidney disease: Secondary | ICD-10-CM | POA: Diagnosis not present

## 2020-07-01 DIAGNOSIS — D689 Coagulation defect, unspecified: Secondary | ICD-10-CM | POA: Diagnosis not present

## 2020-07-01 DIAGNOSIS — Z992 Dependence on renal dialysis: Secondary | ICD-10-CM | POA: Diagnosis not present

## 2020-07-01 DIAGNOSIS — D509 Iron deficiency anemia, unspecified: Secondary | ICD-10-CM | POA: Diagnosis not present

## 2020-07-01 DIAGNOSIS — E875 Hyperkalemia: Secondary | ICD-10-CM | POA: Diagnosis not present

## 2020-07-01 DIAGNOSIS — N186 End stage renal disease: Secondary | ICD-10-CM | POA: Diagnosis not present

## 2020-07-06 DIAGNOSIS — Z992 Dependence on renal dialysis: Secondary | ICD-10-CM | POA: Diagnosis not present

## 2020-07-06 DIAGNOSIS — N186 End stage renal disease: Secondary | ICD-10-CM | POA: Diagnosis not present

## 2020-07-06 DIAGNOSIS — D509 Iron deficiency anemia, unspecified: Secondary | ICD-10-CM | POA: Diagnosis not present

## 2020-07-06 DIAGNOSIS — D689 Coagulation defect, unspecified: Secondary | ICD-10-CM | POA: Diagnosis not present

## 2020-07-06 DIAGNOSIS — N2581 Secondary hyperparathyroidism of renal origin: Secondary | ICD-10-CM | POA: Diagnosis not present

## 2020-07-06 DIAGNOSIS — D631 Anemia in chronic kidney disease: Secondary | ICD-10-CM | POA: Diagnosis not present

## 2020-07-06 DIAGNOSIS — E875 Hyperkalemia: Secondary | ICD-10-CM | POA: Diagnosis not present

## 2020-07-08 DIAGNOSIS — D509 Iron deficiency anemia, unspecified: Secondary | ICD-10-CM | POA: Diagnosis not present

## 2020-07-08 DIAGNOSIS — N2581 Secondary hyperparathyroidism of renal origin: Secondary | ICD-10-CM | POA: Diagnosis not present

## 2020-07-08 DIAGNOSIS — D689 Coagulation defect, unspecified: Secondary | ICD-10-CM | POA: Diagnosis not present

## 2020-07-08 DIAGNOSIS — N186 End stage renal disease: Secondary | ICD-10-CM | POA: Diagnosis not present

## 2020-07-08 DIAGNOSIS — D631 Anemia in chronic kidney disease: Secondary | ICD-10-CM | POA: Diagnosis not present

## 2020-07-08 DIAGNOSIS — Z992 Dependence on renal dialysis: Secondary | ICD-10-CM | POA: Diagnosis not present

## 2020-07-08 DIAGNOSIS — E875 Hyperkalemia: Secondary | ICD-10-CM | POA: Diagnosis not present

## 2020-07-11 DIAGNOSIS — Z992 Dependence on renal dialysis: Secondary | ICD-10-CM | POA: Diagnosis not present

## 2020-07-11 DIAGNOSIS — D689 Coagulation defect, unspecified: Secondary | ICD-10-CM | POA: Diagnosis not present

## 2020-07-11 DIAGNOSIS — N2581 Secondary hyperparathyroidism of renal origin: Secondary | ICD-10-CM | POA: Diagnosis not present

## 2020-07-11 DIAGNOSIS — N186 End stage renal disease: Secondary | ICD-10-CM | POA: Diagnosis not present

## 2020-07-11 DIAGNOSIS — D631 Anemia in chronic kidney disease: Secondary | ICD-10-CM | POA: Diagnosis not present

## 2020-07-12 DIAGNOSIS — I12 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 stage renal disease: Secondary | ICD-10-CM | POA: Diagnosis not present

## 2020-07-12 DIAGNOSIS — N186 End stage renal disease: Secondary | ICD-10-CM | POA: Diagnosis not present

## 2020-07-12 DIAGNOSIS — Z992 Dependence on renal dialysis: Secondary | ICD-10-CM | POA: Diagnosis not present

## 2020-07-13 DIAGNOSIS — R519 Headache, unspecified: Secondary | ICD-10-CM | POA: Diagnosis not present

## 2020-07-13 DIAGNOSIS — N2581 Secondary hyperparathyroidism of renal origin: Secondary | ICD-10-CM | POA: Diagnosis not present

## 2020-07-13 DIAGNOSIS — E875 Hyperkalemia: Secondary | ICD-10-CM | POA: Diagnosis not present

## 2020-07-13 DIAGNOSIS — D689 Coagulation defect, unspecified: Secondary | ICD-10-CM | POA: Diagnosis not present

## 2020-07-13 DIAGNOSIS — N186 End stage renal disease: Secondary | ICD-10-CM | POA: Diagnosis not present

## 2020-07-13 DIAGNOSIS — Z992 Dependence on renal dialysis: Secondary | ICD-10-CM | POA: Diagnosis not present

## 2020-07-15 DIAGNOSIS — N2581 Secondary hyperparathyroidism of renal origin: Secondary | ICD-10-CM | POA: Diagnosis not present

## 2020-07-15 DIAGNOSIS — Z79891 Long term (current) use of opiate analgesic: Secondary | ICD-10-CM | POA: Diagnosis not present

## 2020-07-15 DIAGNOSIS — Z992 Dependence on renal dialysis: Secondary | ICD-10-CM | POA: Diagnosis not present

## 2020-07-15 DIAGNOSIS — R519 Headache, unspecified: Secondary | ICD-10-CM | POA: Diagnosis not present

## 2020-07-15 DIAGNOSIS — D689 Coagulation defect, unspecified: Secondary | ICD-10-CM | POA: Diagnosis not present

## 2020-07-15 DIAGNOSIS — E875 Hyperkalemia: Secondary | ICD-10-CM | POA: Diagnosis not present

## 2020-07-15 DIAGNOSIS — N186 End stage renal disease: Secondary | ICD-10-CM | POA: Diagnosis not present

## 2020-07-18 DIAGNOSIS — D689 Coagulation defect, unspecified: Secondary | ICD-10-CM | POA: Diagnosis not present

## 2020-07-18 DIAGNOSIS — D509 Iron deficiency anemia, unspecified: Secondary | ICD-10-CM | POA: Diagnosis not present

## 2020-07-18 DIAGNOSIS — D631 Anemia in chronic kidney disease: Secondary | ICD-10-CM | POA: Diagnosis not present

## 2020-07-18 DIAGNOSIS — N2581 Secondary hyperparathyroidism of renal origin: Secondary | ICD-10-CM | POA: Diagnosis not present

## 2020-07-18 DIAGNOSIS — N186 End stage renal disease: Secondary | ICD-10-CM | POA: Diagnosis not present

## 2020-07-18 DIAGNOSIS — Z992 Dependence on renal dialysis: Secondary | ICD-10-CM | POA: Diagnosis not present

## 2020-07-20 DIAGNOSIS — D689 Coagulation defect, unspecified: Secondary | ICD-10-CM | POA: Diagnosis not present

## 2020-07-20 DIAGNOSIS — N2581 Secondary hyperparathyroidism of renal origin: Secondary | ICD-10-CM | POA: Diagnosis not present

## 2020-07-20 DIAGNOSIS — N186 End stage renal disease: Secondary | ICD-10-CM | POA: Diagnosis not present

## 2020-07-20 DIAGNOSIS — D509 Iron deficiency anemia, unspecified: Secondary | ICD-10-CM | POA: Diagnosis not present

## 2020-07-20 DIAGNOSIS — Z992 Dependence on renal dialysis: Secondary | ICD-10-CM | POA: Diagnosis not present

## 2020-07-20 DIAGNOSIS — D631 Anemia in chronic kidney disease: Secondary | ICD-10-CM | POA: Diagnosis not present

## 2020-07-22 DIAGNOSIS — N186 End stage renal disease: Secondary | ICD-10-CM | POA: Diagnosis not present

## 2020-07-22 DIAGNOSIS — Z992 Dependence on renal dialysis: Secondary | ICD-10-CM | POA: Diagnosis not present

## 2020-07-22 DIAGNOSIS — D631 Anemia in chronic kidney disease: Secondary | ICD-10-CM | POA: Diagnosis not present

## 2020-07-22 DIAGNOSIS — D509 Iron deficiency anemia, unspecified: Secondary | ICD-10-CM | POA: Diagnosis not present

## 2020-07-22 DIAGNOSIS — D689 Coagulation defect, unspecified: Secondary | ICD-10-CM | POA: Diagnosis not present

## 2020-07-22 DIAGNOSIS — N2581 Secondary hyperparathyroidism of renal origin: Secondary | ICD-10-CM | POA: Diagnosis not present

## 2020-07-25 DIAGNOSIS — N186 End stage renal disease: Secondary | ICD-10-CM | POA: Diagnosis not present

## 2020-07-25 DIAGNOSIS — Z992 Dependence on renal dialysis: Secondary | ICD-10-CM | POA: Diagnosis not present

## 2020-07-25 DIAGNOSIS — D689 Coagulation defect, unspecified: Secondary | ICD-10-CM | POA: Diagnosis not present

## 2020-07-25 DIAGNOSIS — E875 Hyperkalemia: Secondary | ICD-10-CM | POA: Diagnosis not present

## 2020-07-25 DIAGNOSIS — N2581 Secondary hyperparathyroidism of renal origin: Secondary | ICD-10-CM | POA: Diagnosis not present

## 2020-07-25 NOTE — Pre-Procedure Instructions (Addendum)
Surgical Instructions    Your procedure is scheduled on Wednesday, June 22nd.  Report to [facility] Main Entrance "A" at 11:15 A.M., then check in with the Admitting office.  Call this number if you have problems the morning of surgery:  [phone]   If you have any questions prior to your surgery date call [phone]: Open Monday-Friday 8am-4pm    Remember:  Do not eat after midnight the night before your surgery.  You may drink clear liquids until 10:15 AM the morning of your surgery.   Clear liquids allowed are: Water, Non-Citrus Juices (without pulp), Carbonated Beverages, Clear Tea, Black Coffee Only, and Gatorade.    Enhanced Recovery after Surgery for Orthopedics Enhanced Recovery after Surgery is a protocol used to improve the stress on your body and your recovery after surgery.  Patient Instructions  The day of surgery (if you do NOT have diabetes):  Drink ONE (1) Pre-Surgery Clear Ensure by _10:15 AM the morning of surgery.   This drink was given to you during your hospital pre-op appointment visit. Nothing else to drink after completing the Pre-Surgery Clear Ensure.          If you have questions, please contact your surgeon's office.     Take these medicines the morning of surgery with A SIP OF WATER: atorvastatin (LIPITOR) cinacalcet (SENSIPAR)  gabapentin (NEURONTIN)  LOKELMA midodrine (PROAMATINE)  omeprazole (PRILOSEC) topiramate (TOPAMAX)   IF NEEDED: oxyCODONE (OXY IR/ROXICODONE) traMADol (ULTRAM)  cromolyn (OPTICROM) eye drops    *Follow your surgeon's instructions on when to stop Aspirin.  If no instructions were given by your surgeon then you will need to call the office to get those instructions.     As of today, STOP taking any Aleve, Naproxen, Ibuprofen, Motrin, Advil, Goody's, BC's, all herbal medications, fish oil, and all vitamins.   The Morning of Surgery:         Do not wear jewelry. Do not wear lotions, powders, colognes, or  deodorant. Men may shave face and neck. Do not bring valuables to the hospital.             [facility] is not responsible for any belongings or valuables.  Do NOT Smoke (Tobacco/Vaping) or drink Alcohol 24 hours prior to your procedure.  If you use a CPAP at night, you may bring all equipment for your overnight stay.   Contacts, glasses, dentures or bridgework may not be worn into surgery, please bring cases for these belongings   For patients admitted to the hospital, discharge time will be determined by your treatment team.   Patients discharged the day of surgery will not be allowed to drive home, and someone needs to stay with them for 24 hours.  ONLY 1 SUPPORT PERSON MAY BE PRESENT WHILE YOU ARE IN SURGERY. IF YOU ARE TO BE ADMITTED ONCE YOU ARE IN YOUR ROOM YOU WILL BE ALLOWED TWO (2) VISITORS.  Minor children may have two parents present. Special consideration for safety and communication needs will be reviewed on a case by case basis.      Special instructions:    Oral Hygiene is also important to reduce your risk of infection.  Remember - BRUSH YOUR TEETH THE MORNING OF SURGERY WITH YOUR REGULAR TOOTHPASTE                                    [facility]- Preparing for Total Shoulder  Arthroplasty   Before surgery, you can play an important role. Because skin is not sterile, your skin needs to be as free of germs as possible. You can reduce the number of germs on your skin by using the following products. Benzoyl Peroxide Gel Reduces the number of germs present on the skin Applied twice a day to shoulder area starting two days before surgery   Chlorhexidine Gluconate (CHG) Soap An antiseptic cleaner that kills germs and bonds with the skin to continue killing germs even after washing Used for showering the night before surgery and morning of surgery   Oral Hygiene is also important to reduce your risk of infection.                                    Remember - BRUSH YOUR  TEETH THE MORNING OF SURGERY WITH YOUR REGULAR TOOTHPASTE  ==================================================================  Please follow these instructions carefully:  BENZOYL PEROXIDE 5% GEL  Please do not use if you have an allergy to benzoyl peroxide.   If your skin becomes reddened/irritated stop using the benzoyl peroxide.  Starting two days before surgery, apply as follows: Apply benzoyl peroxide in the morning and at night. Apply after taking a shower. If you are not taking a shower clean entire shoulder front, back, and side along with the armpit with a clean wet washcloth.  Place a quarter-sized dollop on your shoulder and rub in thoroughly, making sure to cover the front, back, and side of your shoulder, along with the armpit.   2 days before ____ AM   ____ PM              1 day before ____ AM   ____ PM                             Do this twice a day for two days.  (Last application is the night before surgery, AFTER using the CHG soap as described below).  Do NOT apply benzoyl peroxide gel on the day of surgery.  CHLORHEXIDINE GLUCONATE (CHG) SOAP  Please do not use if you have an allergy to CHG or antibacterial soaps. If your skin becomes reddened/irritated stop using the CHG.   Do not shave (including legs and underarms) for at least 48 hours prior to first CHG shower. It is OK to shave your face.  Starting the night before surgery, use CHG soap as follows:  Shower the NIGHT BEFORE SURGERY and MORNING OF SURGERY with CHG.  If you choose to wash your hair, wash your hair first as usual with your normal shampoo.  After shampooing, rinse your hair and body thoroughly to remove the shampoo.  Use CHG as you would any other liquid soap.  You can apply CHG directly to the skin and wash gently with a scrungie or a clean washcloth.  Apply the CHG soap to your body ONLY FROM THE NECK DOWN.  Do not use on open wounds or open sores.  Avoid contact with your eyes, ears,  mouth, and genitals (private parts).  Wash face and genitals (private parts) with your normal soap.  Wash thoroughly, paying special attention to the area where your surgery will be performed.  Thoroughly rinse your body with warm water from the neck down.  DO NOT shower/wash with your normal soap after using and rinsing off the CHG soap.  Pat yourself dry with a CLEAN TOWEL.    Apply benzoyl peroxide.   Wear CLEAN PAJAMAS to bed the night before surgery; wear comfortable clothes the morning of surgery.  Place CLEAN SHEETS on your bed the night of your first shower and DO NOT SLEEP WITH PETS.  Day of Surgery: Shower as above Do not apply any deodorants/lotions.  Please wear clean clothes to the hospital/surgery center.   Remember to brush your teeth WITH YOUR REGULAR TOOTHPASTE.     Please read over the following fact sheets that you were given.

## 2020-07-26 ENCOUNTER — Inpatient Hospital Stay (HOSPITAL_COMMUNITY)
Admission: RE | Admit: 2020-07-26 | Discharge: 2020-07-26 | Disposition: A | Discharge status: Home / Self Care | Payer: Medicare Other | Source: Ambulatory Visit

## 2020-07-26 ENCOUNTER — Encounter (HOSPITAL_COMMUNITY): Payer: Self-pay

## 2020-07-26 NOTE — Progress Notes (Signed)
Pt did not show up for PAT appointment, pt rescheduled for PAT appointment on Thursday 07/28/20 @ 0900 am.

## 2020-07-27 DIAGNOSIS — E875 Hyperkalemia: Secondary | ICD-10-CM | POA: Diagnosis not present

## 2020-07-27 DIAGNOSIS — D689 Coagulation defect, unspecified: Secondary | ICD-10-CM | POA: Diagnosis not present

## 2020-07-27 DIAGNOSIS — N2581 Secondary hyperparathyroidism of renal origin: Secondary | ICD-10-CM | POA: Diagnosis not present

## 2020-07-27 DIAGNOSIS — Z992 Dependence on renal dialysis: Secondary | ICD-10-CM | POA: Diagnosis not present

## 2020-07-27 DIAGNOSIS — N186 End stage renal disease: Secondary | ICD-10-CM | POA: Diagnosis not present

## 2020-07-28 ENCOUNTER — Inpatient Hospital Stay (HOSPITAL_COMMUNITY)
Admission: RE | Admit: 2020-07-28 | Discharge: 2020-07-28 | Disposition: A | Discharge status: Home / Self Care | Payer: Medicare Other | Source: Ambulatory Visit

## 2020-07-28 NOTE — Progress Notes (Signed)
Pt called in am to say he could not come to his 9am PAT appointment due to car trouble, but said he could come at 3pm today. [name] did not come for 3pm appointment. Unable to reach him by phone. Left him a voicemail asking him to call  PAT office to reschedule.

## 2020-07-29 ENCOUNTER — Inpatient Hospital Stay (HOSPITAL_COMMUNITY): Admission: RE | Admit: 2020-07-29 | Payer: Medicare Other | Source: Ambulatory Visit

## 2020-07-29 DIAGNOSIS — N186 End stage renal disease: Secondary | ICD-10-CM | POA: Diagnosis not present

## 2020-07-29 DIAGNOSIS — E875 Hyperkalemia: Secondary | ICD-10-CM | POA: Diagnosis not present

## 2020-07-29 DIAGNOSIS — D689 Coagulation defect, unspecified: Secondary | ICD-10-CM | POA: Diagnosis not present

## 2020-07-29 DIAGNOSIS — Z992 Dependence on renal dialysis: Secondary | ICD-10-CM | POA: Diagnosis not present

## 2020-07-29 DIAGNOSIS — N2581 Secondary hyperparathyroidism of renal origin: Secondary | ICD-10-CM | POA: Diagnosis not present

## 2020-08-01 ENCOUNTER — Other Ambulatory Visit (HOSPITAL_COMMUNITY)
Admission: RE | Admit: 2020-08-01 | Discharge: 2020-08-01 | Disposition: A | Discharge status: Home / Self Care | Payer: Medicare Other | Source: Ambulatory Visit | Attending: Orthopaedic Surgery | Admitting: Orthopaedic Surgery

## 2020-08-01 ENCOUNTER — Other Ambulatory Visit: Payer: Self-pay | Admitting: Orthopaedic Surgery

## 2020-08-01 ENCOUNTER — Ambulatory Visit
Admission: RE | Admit: 2020-08-01 | Discharge: 2020-08-01 | Disposition: A | Discharge status: Home / Self Care | Payer: Medicare Other | Source: Ambulatory Visit | Attending: Orthopaedic Surgery | Admitting: Orthopaedic Surgery

## 2020-08-01 DIAGNOSIS — M25511 Pain in right shoulder: Secondary | ICD-10-CM

## 2020-08-01 DIAGNOSIS — Z20822 Contact with and (suspected) exposure to covid-19: Secondary | ICD-10-CM | POA: Diagnosis not present

## 2020-08-01 DIAGNOSIS — M19011 Primary osteoarthritis, right shoulder: Secondary | ICD-10-CM | POA: Diagnosis not present

## 2020-08-01 DIAGNOSIS — Z01818 Encounter for other preprocedural examination: Secondary | ICD-10-CM | POA: Diagnosis not present

## 2020-08-01 DIAGNOSIS — N186 End stage renal disease: Secondary | ICD-10-CM | POA: Diagnosis not present

## 2020-08-01 DIAGNOSIS — Z992 Dependence on renal dialysis: Secondary | ICD-10-CM | POA: Diagnosis not present

## 2020-08-01 DIAGNOSIS — M25411 Effusion, right shoulder: Secondary | ICD-10-CM | POA: Diagnosis not present

## 2020-08-01 DIAGNOSIS — D689 Coagulation defect, unspecified: Secondary | ICD-10-CM | POA: Diagnosis not present

## 2020-08-01 DIAGNOSIS — M75101 Unspecified rotator cuff tear or rupture of right shoulder, not specified as traumatic: Secondary | ICD-10-CM | POA: Diagnosis not present

## 2020-08-01 DIAGNOSIS — N2581 Secondary hyperparathyroidism of renal origin: Secondary | ICD-10-CM | POA: Diagnosis not present

## 2020-08-01 DIAGNOSIS — Z01812 Encounter for preprocedural laboratory examination: Secondary | ICD-10-CM | POA: Diagnosis not present

## 2020-08-01 DIAGNOSIS — D509 Iron deficiency anemia, unspecified: Secondary | ICD-10-CM | POA: Diagnosis not present

## 2020-08-01 DIAGNOSIS — D631 Anemia in chronic kidney disease: Secondary | ICD-10-CM | POA: Diagnosis not present

## 2020-08-01 LAB — SARS CORONAVIRUS 2 (TAT 6-24 HRS): SARS Coronavirus 2: NEGATIVE

## 2020-08-02 ENCOUNTER — Other Ambulatory Visit: Payer: Self-pay

## 2020-08-02 ENCOUNTER — Encounter (HOSPITAL_COMMUNITY): Payer: Self-pay | Admitting: Orthopaedic Surgery

## 2020-08-02 ENCOUNTER — Encounter (HOSPITAL_COMMUNITY): Payer: Self-pay | Admitting: Vascular Surgery

## 2020-08-02 ENCOUNTER — Encounter (HOSPITAL_COMMUNITY): Payer: Self-pay | Admitting: Anesthesiology

## 2020-08-02 ENCOUNTER — Encounter (HOSPITAL_COMMUNITY)
Admission: RE | Admit: 2020-08-02 | Discharge: 2020-08-02 | Disposition: A | Discharge status: Home / Self Care | Payer: Medicare Other | Source: Ambulatory Visit | Attending: Orthopaedic Surgery | Admitting: Orthopaedic Surgery

## 2020-08-02 ENCOUNTER — Encounter (HOSPITAL_COMMUNITY): Payer: Self-pay

## 2020-08-02 DIAGNOSIS — Z7982 Long term (current) use of aspirin: Secondary | ICD-10-CM | POA: Diagnosis not present

## 2020-08-02 DIAGNOSIS — Z87891 Personal history of nicotine dependence: Secondary | ICD-10-CM | POA: Diagnosis not present

## 2020-08-02 DIAGNOSIS — N186 End stage renal disease: Secondary | ICD-10-CM | POA: Insufficient documentation

## 2020-08-02 DIAGNOSIS — K219 Gastro-esophageal reflux disease without esophagitis: Secondary | ICD-10-CM | POA: Diagnosis not present

## 2020-08-02 DIAGNOSIS — Z8673 Personal history of transient ischemic attack (TIA), and cerebral infarction without residual deficits: Secondary | ICD-10-CM | POA: Diagnosis not present

## 2020-08-02 DIAGNOSIS — I12 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 stage renal disease: Secondary | ICD-10-CM | POA: Insufficient documentation

## 2020-08-02 DIAGNOSIS — M19011 Primary osteoarthritis, right shoulder: Secondary | ICD-10-CM | POA: Insufficient documentation

## 2020-08-02 DIAGNOSIS — Z79899 Other long term (current) drug therapy: Secondary | ICD-10-CM | POA: Diagnosis not present

## 2020-08-02 DIAGNOSIS — Z992 Dependence on renal dialysis: Secondary | ICD-10-CM | POA: Insufficient documentation

## 2020-08-02 DIAGNOSIS — Z01812 Encounter for preprocedural laboratory examination: Secondary | ICD-10-CM | POA: Diagnosis not present

## 2020-08-02 DIAGNOSIS — N2581 Secondary hyperparathyroidism of renal origin: Secondary | ICD-10-CM | POA: Diagnosis not present

## 2020-08-02 DIAGNOSIS — R7303 Prediabetes: Secondary | ICD-10-CM | POA: Insufficient documentation

## 2020-08-02 HISTORY — DX: Malignant neoplasm of unspecified kidney, except renal pelvis: C64.9

## 2020-08-02 LAB — CBC
HCT: 35.5 % — ABNORMAL LOW (ref 39.0–52.0)
Hemoglobin: 10.9 g/dL — ABNORMAL LOW (ref 13.0–17.0)
MCH: 27.5 pg (ref 26.0–34.0)
MCHC: 30.7 g/dL (ref 30.0–36.0)
MCV: 89.6 fL (ref 80.0–100.0)
Platelets: 275 10*3/uL (ref 150–400)
RBC: 3.96 MIL/uL — ABNORMAL LOW (ref 4.22–5.81)
RDW: 17.2 % — ABNORMAL HIGH (ref 11.5–15.5)
WBC: 5.3 10*3/uL (ref 4.0–10.5)
nRBC: 0 % (ref 0.0–0.2)

## 2020-08-02 LAB — BASIC METABOLIC PANEL
Anion gap: 15 (ref 5–15)
BUN: 52 mg/dL — ABNORMAL HIGH (ref 8–23)
CO2: 27 mmol/L (ref 22–32)
Calcium: 9 mg/dL (ref 8.9–10.3)
Chloride: 96 mmol/L — ABNORMAL LOW (ref 98–111)
Creatinine, Ser: 10.8 mg/dL — ABNORMAL HIGH (ref 0.61–1.24)
GFR, Estimated: 5 mL/min — ABNORMAL LOW (ref >60)
Glucose, Bld: 83 mg/dL (ref 70–99)
Potassium: 4.4 mmol/L (ref 3.5–5.1)
Sodium: 138 mmol/L (ref 135–145)

## 2020-08-02 LAB — SURGICAL PCR SCREEN
MRSA, PCR: NEGATIVE
Staphylococcus aureus: NEGATIVE

## 2020-08-02 NOTE — H&P (View-Only) (Signed)
PREOPERATIVE H&P  Chief Complaint: RIGHT SHOULDER DJD  HPI: Carl Fauver Sr. is a 60 y.o. male who presents for preoperative history and physical prior to scheduled surgery, Procedure(s): REVERSE SHOULDER ARTHROPLASTY APPLICATION OF CELL SAVER.   Patient has a past medical history significant for former smoker, ESRD - RUA AVF, on dialysis, left renal cell cancer, HTN, borderline DM, secondary hyperparathyroidism, CVA (2016), anemia, GERD, GI bleed (likely diverticular bleed 06/2014, near perforated cecal mass 07/2015; gastric ulcer 07/2019), cecal mass (s/p laparoscopic right hemicolectomy 08/05/15, pathology, inflammatory mass with ulceration/adhesions, no malignancy), seizure (2017, likely related to electrolyte abnormalities from HD non-compliance), chronic headaches, OSA  no CPAP after weight loss), REFUSAL OF BLOOD PRODUCTS ([employer]'s [employer])..   Patient is a 60 year-old on hemodialysis with multiple medical comorbidities, including right TIA, GI bleed, ESRD, gout, diabetes and hypertension.  He has had a significant amount of right shoulder pain for many months.  He has not made much improvement with non-operative management including injections and home exercise program. He continues to be quite debilitated by his shoulder.  He is worried about his ability to function actively.  His symptoms are rated as moderate to severe, and have been worsening.  This is significantly impairing activities of daily living.    Please see clinic note for further details on this patient's care.    He has elected for surgical management.   Past Medical History:  Diagnosis Date   Anemia    hx of   Anxiety    situational    Arthritis    on meds   Borderline diabetes    Cataract    bilateral sx   Chronic headaches    Depression    situational    Diverticulitis    ESRD (end stage renal disease) ([place])     On Renal Transplant List," [facility]; MWF" (10/23/2016)   GERD (gastroesophageal reflux  disease)    with certain foods   GI bleed    Hypertension    diet controlled   Parathyroid abnormality ([place])    ectopic parathyroid gland   Presence of arteriovenous fistula for hemodialysis, primary ([place])    RUE   Refusal of blood product    NO WHOLE BLOOD PROUCTS   Renal cell carcinoma ([place])    s/p hand assisted laparoscopic bilateral nephrectomies 11/29/17, + RCC left   Secondary hyperparathyroidism ([place])    Seizures ([place])    one episode in past, due to" elevated Potassium" 08/02/20- "at least 4 years ago"   Sleep apnea    doesn't use CPAP anymore since weight loss   Stroke [facility])    no residual   Past Surgical History:  Procedure Laterality Date   AV FISTULA PLACEMENT Right    right arm   BIOPSY  08/01/2019   Procedure: BIOPSY;  Surgeon: Juanita Craver, MD;  Location: [place];  Service: Endoscopy;;   CATARACT EXTRACTION W/ INTRAOCULAR LENS  IMPLANT, BILATERAL     COLON SURGERY     COLONOSCOPY N/A 08/04/2015   Procedure: COLONOSCOPY;  Surgeon: Jerene Bears, MD;  Location: [facility] ENDOSCOPY;  Service: Endoscopy;  Laterality: N/A;   COLONOSCOPY  2017   JMP@ [facility]-good prep-mass -recall 1 yr   ESOPHAGOGASTRODUODENOSCOPY N/A 08/01/2019   Procedure: ESOPHAGOGASTRODUODENOSCOPY (EGD);  Surgeon: Juanita Craver, MD;  Location: [facility] ENDOSCOPY;  Service: Endoscopy;  Laterality: N/A;   ESOPHAGOGASTRODUODENOSCOPY (EGD) WITH PROPOFOL N/A 08/04/2019   Procedure: ESOPHAGOGASTRODUODENOSCOPY (EGD) WITH PROPOFOL;  Surgeon: Yetta Flock, MD;  Location: [facility]  ENDOSCOPY;  Service: Gastroenterology;  Laterality: N/A;   graft left arm Left    for dialysis x 2. Removed   INSERTION OF DIALYSIS CATHETER     Rt chest   LAPAROSCOPIC RIGHT COLECTOMY N/A 08/05/2015   Procedure: LAPAROSCOPIC RIGHT COLECTOMY- ASCENDING;  Surgeon: Stark Klein, MD;  Location: [place];  Service: General;  Laterality: N/A;   MASS EXCISION Left 05/28/2019   Procedure: EXCISION SOFT TISSUE MASS LEFT SHOULDER;  Surgeon: Armandina Gemma, MD;  Location: [facility] ORS;  Service: General;  Laterality: Left;   NEPHRECTOMY Bilateral    PARATHYROIDECTOMY N/A 06/12/2016   Procedure: TOTAL PARATHYROIDECTOMY WITH AUTOTRANSPLANTATION TO LEFT FOREARM;  Surgeon: Armandina Gemma, MD;  Location: [facility] OR;  Service: General;  Laterality: N/A;   PARATHYROIDECTOMY N/A 10/23/2016   Procedure: PARATHYROIDECTOMY;  Surgeon: Armandina Gemma, MD;  Location: [place];  Service: General;  Laterality: N/A;   REVISON OF ARTERIOVENOUS FISTULA Right 07/16/2017   Procedure: REVISION OF ARTERIOVENOUS FISTULA  Right ARM;  Surgeon: Waynetta Sandy, MD;  Location: [place];  Service: Vascular;  Laterality: Right;   UPPER GASTROINTESTINAL ENDOSCOPY  2021   @ [facility]   Social History   Socioeconomic History   Marital status: Single    Spouse name: Not on file   Number of children: 2   Years of education: Not on file   Highest education level: Not on file  Occupational History   Occupation: retired    Fish farm manager: SELF EMPLOYED  Tobacco Use   Smoking status: Former    Pack years: 0.00    Types: Cigarettes    Quit date: 06/19/1990    Years since quitting: 30.1   Smokeless tobacco: Never   Tobacco comments:    rarely cigar  Vaping Use   Vaping Use: Never used  Substance and Sexual Activity   Alcohol use: Yes    Alcohol/week: 1.0 standard drink    Types: 1 Cans of beer per week    Comment: occasional   Drug use: Not Currently    Types: Marijuana   Sexual activity: Not on file  Other Topics Concern   Not on file  Social History Narrative   Lives alone at home   12 years of education    2 children    Not married    Social Determinants of Health   Financial Resource Strain: Not on file  Food Insecurity: Not on file  Transportation Needs: Not on file  Physical Activity: Not on file  Stress: Not on file  Social Connections: Not on file   Family History  Problem Relation Age of Onset   Diabetes Father    Stroke Father    Hypertension Father     Uterine cancer Mother    Lupus Sister    Stroke Sister    Hypertension Sister    Anuerysm Brother        brain   Colon cancer Neg Hx    Esophageal cancer Neg Hx    Stomach cancer Neg Hx    Pancreatic cancer Neg Hx    Liver disease Neg Hx    Colon polyps Neg Hx    Rectal cancer Neg Hx    Allergies  Allergen Reactions   Infed [Iron Dextran] Other (See Comments)    Decreased BP    Oxycodone Nausea Only   Vicodin [Hydrocodone-Acetaminophen] Other (See Comments)    Decreased BP   Whole Blood Other (See Comments)    Patient refuses whole blood "unless I am going  to die"   Hydrocodone-Acetaminophen     Other reaction(s): lower BP   Diclofenac Other (See Comments)    Unknown   Prior to Admission medications   Medication Sig Start Date End Date Taking? Authorizing Provider  acetaminophen (TYLENOL) 650 MG CR tablet Take 650 mg by mouth every 8 (eight) hours as needed for pain.   Yes [provider]  aspirin EC 81 MG tablet Take 1 tablet (81 mg total) by mouth daily. 08/09/19  Yes Pahwani, Einar Grad, MD  atorvastatin (LIPITOR) 10 MG tablet Take 10 mg by mouth daily.   Yes [provider]  cinacalcet (SENSIPAR) 60 MG tablet Take 120 mg by mouth daily.   Yes [provider]  cromolyn (OPTICROM) 4 % ophthalmic solution Place 1 drop into both eyes daily as needed (redness).   Yes [provider]  docusate sodium (COLACE) 100 MG capsule Take 100 mg by mouth 2 (two) times daily.   Yes [provider]  gabapentin (NEURONTIN) 100 MG capsule Take 100 mg by mouth 2 (two) times daily.  09/29/14  Yes [provider]  ibuprofen (ADVIL) 800 MG tablet Take 800 mg by mouth every 8 (eight) hours as needed for moderate pain. 12/11/19  Yes [provider]  Lanthanum Carbonate (FOSRENOL) 1000 MG PACK Take 1 Package by mouth 3 (three) times daily. Mix with applesauce at meals   Yes [provider]  LOKELMA 10 g PACK packet Take 1 packet by mouth  daily. 03/11/19  Yes [provider]  midodrine (PROAMATINE) 10 MG tablet Take 10 mg by mouth 3 (three) times a week. Prn pre dialysis 02/04/20  Yes [provider]  multivitamin (RENA-VIT) TABS tablet Take 1 tablet by mouth daily.   Yes [provider]  omeprazole (PRILOSEC) 20 MG capsule Take 40 mg by mouth in the morning and at bedtime.   Yes [provider]  oxyCODONE (OXY IR/ROXICODONE) 5 MG immediate release tablet Take 5 mg by mouth 2 (two) times daily as needed. 07/15/20  Yes [provider]  topiramate (TOPAMAX) 25 MG tablet Take 1 tablet (25 mg total) by mouth 2 (two) times daily. Patient taking differently: Take 25 mg by mouth daily. 03/08/20  Yes McCue, Janett Billow, NP  traMADol (ULTRAM) 50 MG tablet Take 50 mg by mouth every 6 (six) hours as needed for severe pain.   Yes [provider]  Turmeric (QC TUMERIC COMPLEX PO) Take 1 capsule by mouth daily. 08/06/19  Yes [provider]  vitamin E 180 MG (400 UNITS) capsule Take 400 Units by mouth daily.   Yes [provider]  atorvastatin (LIPITOR) 20 MG tablet Take 1 tablet (20 mg total) by mouth daily at 6 PM. Patient not taking: No sig reported 03/09/20   Frann Rider, NP    ROS: All other systems have been reviewed and were otherwise negative with the exception of those mentioned in the HPI and as above.  Physical Exam: General: Alert, no acute distress Cardiovascular: No pedal edema Respiratory: No cyanosis, no use of accessory musculature GI: No organomegaly, abdomen is soft and non-tender Skin: No lesions in the area of chief complaint Neurologic: Sensation intact distally Psychiatric: Patient is competent for consent with normal mood and affect Lymphatic: No axillary or cervical lymphadenopathy  MUSCULOSKELETAL:  Right shoulder: Active elevation to 45, passive to 90.  External rotation 10.  Essentially pseudoparalytic with 3/5 strength at the cuff.  Internal  rotation to back pocket.    Imaging:  MRI reviewed in canopy system demonstrating essentially a massive irreparable cuff tear past the level of the glenoid with significant bursitis and debris within the joint.  Additionally, he has significant arthritis in the joint as well.    Assessment: Arthritic changes in the setting of massive cuff tear.    Plan: Plan for Procedure(s): REVERSE SHOULDER ARTHROPLASTY APPLICATION OF CELL SAVER  Patient has multiple medical comorbidities.  However, he has a significant problem and is extremely debilitated by his shoulder.  His AV fistula in his right arm makes this extraordinarily complex.  We talked to the vascular surgeons and would have to be done [facility].  He has a high risk for bleeding.  He has denied blood products and if there is a significant amount of bleeding, which is possible, it could end his life.  He understands all this and still requests no blood products.  We will use a cell saver.  We will have vascular surgery on backup and alert them the morning of the case.  We have spoken to Dr. Donzetta Matters from vascular surgery who did his AV fistula years ago.  They feel reasonable about the plan.  He may end up needing grafting of his phallic vein, as it is in the field after the surgery.    The risks benefits and alternatives were discussed with the patient including but not limited to the risks of nonoperative treatment, versus surgical intervention including infection, bleeding, nerve injury,  blood clots, cardiopulmonary complications, morbidity, mortality, among others, and they were willing to proceed.   We additionally specifically discussed risks of axillary nerve injury, infection, periprosthetic fracture, continued pain and longevity of implants prior to beginning procedure.    Patient will be admitted for inpatient treatment for surgery, pain control, OT, prophylactic antibiotics, VTE prophylaxis, and discharge planning. The patient is planning  to be discharged home with outpatient PT.   The patient acknowledged the explanation, agreed to proceed with the plan and consent was signed.   He received operative clearance from PCP, Dr. Dorthy Cooler, and nephrologist, Dr. Royetta Crochet. He was scheduled for dialysis the day prior to surgery and again on Friday. Avoid tourniquet/compression/IV/BP checks on the right arm.   Will require nephrology consultation post-op.   Operative Plan: Right reverse total shoulder arthroplasty with use of cell saver Discharge Medications: Tylenol, Gabapentin, Oxycodone, Robaxin. Zofran DVT Prophylaxis: Ambulation. SCDs. Physical Therapy: Outpatient PT Special Discharge needs: Sling. IceMan. Cell Saver.    Ethelda Chick, PA-C  08/02/2020 5:08 PM

## 2020-08-02 NOTE — Progress Notes (Addendum)
I left a voice message for [name], informing patient  that the needs to come to PAT today to have labs drawn and to get instructions- including showering and Benzoyl Peroxide gel.  I also need to verify history and other pre admission questions.  I asked patient to call my number asap.  I called Judeen Hammans, Dr. Rich Fuchs scheduler to inform her that patient did have Covid test on 08/01/20, but did not come to PAT, Judeen Hammans is going to call [name] and ask patient to call me to set up a time he will come in today for PAT. [name] called me , we did a phone interview, patient said he will come in at 2 pm for labs and instructions and to sign consents.

## 2020-08-02 NOTE — Progress Notes (Addendum)
PCP - Dr. Sherrill Raring  Nephrologist - seen at [place] - Dr. Buford Dresser  Chest x-ray - 06/17/20  EKG - 11/10/19  Stress Test -   ECHO - 2017  Cardiac Cath - no  AICD-no PM-no LOOP-no  Sleep Study - 2014 CPAP - "I lost a ton of weight and do not need it any longer."   (This is noted in cardiology notes.)  LABS-CBC BMP PCR  ASA-stopped 1 week ago 07/26/20  ERAS-yes  HA1C-na Fasting Blood Sugar - na Checks Blood Sugar __na___ times a day  Anesthesia-  Pt denies having chest pain, sob, or fever at this time. All instructions explained to the pt, with a verbal understanding of the material. Pt agrees to go over the instructions while at home for a better understanding. Pt also instructed to self quarantine after being tested for COVID-19. The opportunity to ask questions was provided.

## 2020-08-02 NOTE — Progress Notes (Signed)
Pt is Jehovah's Witness. He does not want a blood transfusion but he is unsure whether or not he can accept albumin. He wants to talk with someone from his church and will sign the refusal of blood products form when he comes for surgery. Note placed on chart.

## 2020-08-02 NOTE — Progress Notes (Signed)
Anesthesia Chart Review:  Case: [id number] Date/Time: 08/03/20 1415   Procedures:      REVERSE SHOULDER ARTHROPLASTY (Right: Shoulder)     APPLICATION OF CELL SAVER   Anesthesia type: Choice   Pre-op diagnosis: RIGHT SHOULDER DJD   Location: [facility] OR ROOM 07 / [place] [facility]   Surgeons: Hiram Gash, MD       DISCUSSION: Patient is a 60 year old male scheduled for the above procedure. PAT was scheduled for 08/02/20, as he did not show for 07/26/20 or 07/28/20 PAT appointments.   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   H/H 10.9/35.5. He does not want a blood transfusion, but wants to discuss with a church leader before he makes a decision regarding whether or not he would accept albumin if indicated. He plans to clarify and sign refusal of blood products form on the day of surgery.   Last ASA 07/26/20. 08/01/20 presurgical COVID-19 test negative. Anesthesia team to evaluate on the day of surgery.    VS: BP 110/77   Pulse 86   Temp 36.7 C (Oral)   Resp 18   Ht 6\' 1"  (1.854 m)   Wt 97.8 kg   SpO2 100%   BMI 28.43 kg/m     PROVIDERS: Koirala, Dibas, MD is PCP. Records suggest he had a preoperative evaluation on 06/20/20.   Rosalin Hawking, MD is neurologist. Last visit 03/08/20 with Frann Rider, NP for CVA and headache follow-up. One year follow-up recommended.  [name], MD is GI  - Buford Dresser, MD is cardiologist. Visit on 11/10/19 seen as new patient for evaluation and management of cardiovascular management pre-kidney transplant. He was reportedly frustrated for being taken off the [facility] transplant list and was trying to get new evaluation through [facility]. 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 She wrote, "Lambl's excrescence: noted on recent echo, with comments that it was also seen in 2018 and 2020. We reviewed his history of CVA. Based on imaging, this was not felt to be an embolic stroke, and it was not acute. Comments note lacunar infarct and chronic microvascular changes. -we discussed that typically indication for resection of these is embolic stroke or recurrent stroke. He does not meet this criteria. -he is already on aspirin -I am unclear why this would take him off of the transplant list, especially as it was noted in the past as well. I am uncertain if there is another reason based on his echo that he would no longer be a transplant candidate. Did discuss that dialysis-associated hypotension is another issue.." He was starting midodrine to help with dialysis associated hypotension. Walking exercises encouraged. Discussed follow-up in six month should he need further evaluation as part of transplant work-up.  Alona Bene, MD is urologist (  [facility])  - Jamal Maes, MD is nephrologist   LABS: Labs reviewed: Acceptable for surgery. For ESRD, he will need ISTAT on arrival.  (all labs ordered are listed, but only abnormal results are displayed)  Labs Reviewed  BASIC METABOLIC PANEL - Abnormal;  Notable for the following components:      Result Value   Chloride 96 (*)    BUN 52 (*)    Creatinine, Ser 10.80 (*)    GFR, Estimated 5 (*)    All other components within normal limits  CBC - Abnormal; Notable for the following components:   RBC 3.96 (*)    Hemoglobin 10.9 (*)    HCT 35.5 (*)    RDW 17.2 (*)    All other components within normal limits  SURGICAL PCR SCREEN   A1c 5.4% 06/17/20 ([facility]).   OTHER: EGD 08/04/19 Havery Moros, Remo Lipps, MD): Impression: - Esophagogastric landmarks identified. - Normal esophagus, - Benign appearing focal polypoid mucosa with superficial erythematous mucosa in the gastric body - no stigmata for bleeding, I do not think causing the patient's symptoms. - Gastric ulcer in the pyloric channel with no stigmata of bleeding - healing on PPI. - No heme in the stomach or anywhere in the bowel. - Erythematous duodenopathy with hypertrophied folds as outlined - no stigmata for bleeding. - Overall, gastric ulcer appears to be healing on PPI, hopefully dark stools represented dark blood, no active bleeding noted.  EEG 12/02/15: Impression: This awake and asleep EEG is within normal limits. Clinical Correlation: A normal EEG does not exclude a clinical diagnosis of epilepsy. Clinical correlation is advised.   Sleep Study 06/23/12: - BMI 30.3, weight 230 pounds, height 73 inches, neck 16 inches. IMPRESSIONS-RECOMMENDATIONS: 1. Severe obstructive sleep apnea/hypopnea syndrome, AHI 36.9 per hour     with supine events.  Moderately loud snoring with oxygen     desaturation to a nadir of 67% on room air...   IMAGES: CT Right Shoulder 08/01/20: Images available for review in [facility]/PACS.  CXR 06/17/20: FINDINGS: - Cardiomediastinal silhouette unchanged in size and contour. No evidence of central vascular congestion. No interlobular septal thickening. Low lung volumes persist with no new confluent airspace disease. - Degenerative changes of the  spine.  No acute displaced fracture. - Scoliotic curvature of the spine, similar to the prior  IMPRESSION: Negative for acute cardiopulmonary disease   MRI Right Shoulder 05/17/20: IMPRESSION: 1. Large full-thickness retracted rotator cuff tear as described above. 2. Severe glenohumeral joint arthropathy with a large complex joint effusion and severe synovitis extending into the subacromial/subdeltoid bursa. 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 3. Likely torn and retracted long head biceps tendon. 4. Severely degenerated and likely torn glenoid labrum.   EKG: EKG 11/10/19: ST at 104 bpm   CV: Echo 09/29/19 ([facility]): SUMMARY  The left ventricular size is normal.  There is mild concentric left ventricular hypertrophy.  Left ventricular systolic function is normal.  LV ejection fraction = 55-60%.  No segmental wall motion abnormalities seen in the left ventricle  Left ventricular filling pattern is indeterminate.  The right ventricle is normal in size and function.  The left atrium is mildly to moderately dilated.  The aortic valve is trileaflet and opens well.  There is a 1.5 to 2 cm thin, filamentous, hypermobile echodensity  noted on the ventricular side of the aortic valve, likely representing  lambl's excrescence.  Estimated right ventricular systolic  pressure is 20 mmHg.  No pulmonary hypertension.  The aortic sinus is normal size.  Mildly dilated ascending aorta.  IVC size was normal.  There is no pericardial effusion.  Compared to prior study, probably no significant change, filamentous  structure visualized on aortic valve also visible in 2020 and 2018.  Clinical correlation required. - Did not meet criteria for excision of likely Lambl's excrescence as of 11/10/19 visit with cardiologist Dr. Harrell Gave.   Carotid Doppler Studies 04/08/18 (as outlined 04/22/18 [facility]  Transplant Eval): - Right: 21-39% diameter reduction of the right bulb. No evidence of hemodynamically significant internal carotid artery stenosis. The right vertebral artery flow is antegrade. - Left: 21-39% diameter reduction of the left bulb. No evidence of hemodynamically  significant internal carotid artery stenosis. The left vertebral artery flow is antegrade.   Iliac Doppler Studies 04/08/18 (as outlined 04/22/18 [facility] Transplant Eval): - Right: No evidence of critical stenosis of the right iliac artery. Patent right iliac vein. - Left: No evidence of hemodynamically significant stenosis of the left iliac artery. Patent left iliac vein.    Stress echo 03/25/18 ([facility]): SUMMARY  The patient had no chest pain during stress  The patient achieved 89 % of maximum predicted heart rate.  Negative stress ECG for inducible ischemia at target heart rate.  Negative dobutamine echocardiography for inducible ischemia at target heart  rate.     Past Medical History:  Diagnosis Date   Anemia    hx of   Anxiety    situational    Arthritis    on meds   Borderline diabetes    Cataract    bilateral sx   Chronic headaches    Depression    situational    Diverticulitis    ESRD (end stage renal disease) ([place])     On Renal Transplant List," [facility]; MWF" (10/23/2016)   GERD (gastroesophageal reflux disease)    with certain foods   GI bleed    Hypertension    diet controlled   Parathyroid abnormality ([place])    ectopic parathyroid gland   Presence of arteriovenous fistula for hemodialysis, primary ([place])    RUE   Refusal of blood product    NO WHOLE BLOOD PROUCTS   Secondary hyperparathyroidism ([place])    Seizures ([place])    one episode in past, due to" elevated Potassium" 08/02/20- "at least 4 years ago"   Sleep apnea    doesn't use CPAP anymore since weight loss   Stroke [facility])    no residual    Past Surgical History:  Procedure Laterality Date   AV FISTULA PLACEMENT Right     right arm   BIOPSY  08/01/2019   Procedure: BIOPSY;  Surgeon: Juanita Craver, MD;  Location: [facility] ENDOSCOPY;  Service: Endoscopy;;   CATARACT EXTRACTION W/ INTRAOCULAR LENS  IMPLANT, BILATERAL     COLON SURGERY     COLONOSCOPY N/A 08/04/2015   Procedure: COLONOSCOPY;  Surgeon: Jerene Bears, MD;  Location: [facility] ENDOSCOPY;  Service: Endoscopy;  Laterality: N/A;   COLONOSCOPY  2017   JMP@ [facility]-good prep-mass -recall 1 yr   ESOPHAGOGASTRODUODENOSCOPY N/A 08/01/2019   Procedure: ESOPHAGOGASTRODUODENOSCOPY (EGD);  Surgeon: Juanita Craver, MD;  Location: [facility] ENDOSCOPY;  Service: Endoscopy;  Laterality: N/A;   ESOPHAGOGASTRODUODENOSCOPY (EGD) WITH PROPOFOL N/A 08/04/2019   Procedure: ESOPHAGOGASTRODUODENOSCOPY (EGD) WITH PROPOFOL;  Surgeon: Yetta Flock, MD;  Location: [place];  Service: Gastroenterology;  Laterality: N/A;   graft left arm Left    for dialysis  x 2. Removed   INSERTION OF DIALYSIS CATHETER     Rt chest   LAPAROSCOPIC RIGHT COLECTOMY N/A 08/05/2015   Procedure: LAPAROSCOPIC RIGHT COLECTOMY- ASCENDING;  Surgeon: Stark Klein, MD;  Location: [place];  Service: General;  Laterality: N/A;   MASS EXCISION Left 05/28/2019   Procedure: EXCISION SOFT TISSUE MASS LEFT SHOULDER;  Surgeon: Armandina Gemma, MD;  Location: [facility];  Service: General;  Laterality: Left;   NEPHRECTOMY Bilateral    PARATHYROIDECTOMY N/A 06/12/2016   Procedure: TOTAL PARATHYROIDECTOMY WITH AUTOTRANSPLANTATION TO LEFT FOREARM;  Surgeon: Armandina Gemma, MD;  Location: [place];  Service: General;  Laterality: N/A;   PARATHYROIDECTOMY N/A 10/23/2016   Procedure: PARATHYROIDECTOMY;  Surgeon: Armandina Gemma, MD;  Location: [place];  Service: General;  Laterality: N/A;   REVISON OF ARTERIOVENOUS FISTULA Right 07/16/2017   Procedure: REVISION OF ARTERIOVENOUS FISTULA  Right ARM;  Surgeon: Waynetta Sandy, MD;  Location: [place];  Service: Vascular;  Laterality: Right;   UPPER GASTROINTESTINAL ENDOSCOPY  2021   @ [facility]     MEDICATIONS:  acetaminophen (TYLENOL) 650 MG CR tablet   aspirin EC 81 MG tablet   atorvastatin (LIPITOR) 10 MG tablet   atorvastatin (LIPITOR) 20 MG tablet   cinacalcet (SENSIPAR) 60 MG tablet   cromolyn (OPTICROM) 4 % ophthalmic solution   docusate sodium (COLACE) 100 MG capsule   gabapentin (NEURONTIN) 100 MG capsule   ibuprofen (ADVIL) 800 MG tablet   Lanthanum Carbonate (FOSRENOL) 1000 MG PACK   LOKELMA 10 g PACK packet   midodrine (PROAMATINE) 10 MG tablet   multivitamin (RENA-VIT) TABS tablet   omeprazole (PRILOSEC) 20 MG capsule   oxyCODONE (OXY IR/ROXICODONE) 5 MG immediate release tablet   topiramate (TOPAMAX) 25 MG tablet   traMADol (ULTRAM) 50 MG tablet   Turmeric (QC TUMERIC COMPLEX PO)   vitamin E 180 MG (400 UNITS) capsule   No current facility-administered medications for this encounter.    Myra Gianotti, PA-C Surgical Short Stay/Anesthesiology [facility] Phone [phone] [facility] Phone [phone] 08/02/2020 4:56 PM

## 2020-08-02 NOTE — H&P (Signed)
PREOPERATIVE H&P  Chief Complaint: RIGHT SHOULDER DJD  HPI: Carl Calico Sr. is a 60 y.o. male who presents for preoperative history and physical prior to scheduled surgery, Procedure(s): REVERSE SHOULDER ARTHROPLASTY APPLICATION OF CELL SAVER.   Patient has a past medical history significant for former smoker, ESRD - RUA AVF, on dialysis, left renal cell cancer, HTN, borderline DM, secondary hyperparathyroidism, CVA (2016), anemia, GERD, GI bleed (likely diverticular bleed 06/2014, near perforated cecal mass 07/2015; gastric ulcer 07/2019), cecal mass (s/p laparoscopic right hemicolectomy 08/05/15, pathology, inflammatory mass with ulceration/adhesions, no malignancy), seizure (2017, likely related to electrolyte abnormalities from HD non-compliance), chronic headaches, OSA  no CPAP after weight loss), REFUSAL OF BLOOD PRODUCTS (Jehovah's Witness)..   Patient is a 60 year-old on hemodialysis with multiple medical comorbidities, including right TIA, GI bleed, ESRD, gout, diabetes and hypertension.  He has had a significant amount of right shoulder pain for many months.  He has not made much improvement with non-operative management including injections and home exercise program. He continues to be quite debilitated by his shoulder.  He is worried about his ability to function actively.  His symptoms are rated as moderate to severe, and have been worsening.  This is significantly impairing activities of daily living.    Please see clinic note for further details on this patient's care.    He has elected for surgical management.   Past Medical History:  Diagnosis Date   Anemia    hx of   Anxiety    situational    Arthritis    on meds   Borderline diabetes    Cataract    bilateral sx   Chronic headaches    Depression    situational    Diverticulitis    ESRD (end stage renal disease) ([place])     On Renal Transplant List," [facility]; MWF" (10/23/2016)   GERD (gastroesophageal reflux  disease)    with certain foods   GI bleed    Hypertension    diet controlled   Parathyroid abnormality ([place])    ectopic parathyroid gland   Presence of arteriovenous fistula for hemodialysis, primary ([place])    RUE   Refusal of blood product    NO WHOLE BLOOD PROUCTS   Renal cell carcinoma ([place])    s/p hand assisted laparoscopic bilateral nephrectomies 11/29/17, + RCC left   Secondary hyperparathyroidism ([place])    Seizures ([place])    one episode in past, due to" elevated Potassium" 08/02/20- "at least 4 years ago"   Sleep apnea    doesn't use CPAP anymore since weight loss   Stroke [facility])    no residual   Past Surgical History:  Procedure Laterality Date   AV FISTULA PLACEMENT Right    right arm   BIOPSY  08/01/2019   Procedure: BIOPSY;  Surgeon: Juanita Craver, MD;  Location: [place];  Service: Endoscopy;;   CATARACT EXTRACTION W/ INTRAOCULAR LENS  IMPLANT, BILATERAL     COLON SURGERY     COLONOSCOPY N/A 08/04/2015   Procedure: COLONOSCOPY;  Surgeon: Jerene Bears, MD;  Location: [facility] ENDOSCOPY;  Service: Endoscopy;  Laterality: N/A;   COLONOSCOPY  2017   JMP@ [facility]-good prep-mass -recall 1 yr   ESOPHAGOGASTRODUODENOSCOPY N/A 08/01/2019   Procedure: ESOPHAGOGASTRODUODENOSCOPY (EGD);  Surgeon: Juanita Craver, MD;  Location: [facility] ENDOSCOPY;  Service: Endoscopy;  Laterality: N/A;   ESOPHAGOGASTRODUODENOSCOPY (EGD) WITH PROPOFOL N/A 08/04/2019   Procedure: ESOPHAGOGASTRODUODENOSCOPY (EGD) WITH PROPOFOL;  Surgeon: Yetta Flock, MD;  Location: [facility]  ENDOSCOPY;  Service: Gastroenterology;  Laterality: N/A;   graft left arm Left    for dialysis x 2. Removed   INSERTION OF DIALYSIS CATHETER     Rt chest   LAPAROSCOPIC RIGHT COLECTOMY N/A 08/05/2015   Procedure: LAPAROSCOPIC RIGHT COLECTOMY- ASCENDING;  Surgeon: Stark Klein, MD;  Location: [place];  Service: General;  Laterality: N/A;   MASS EXCISION Left 05/28/2019   Procedure: EXCISION SOFT TISSUE MASS LEFT SHOULDER;  Surgeon: Armandina Gemma, MD;  Location: [facility] ORS;  Service: General;  Laterality: Left;   NEPHRECTOMY Bilateral    PARATHYROIDECTOMY N/A 06/12/2016   Procedure: TOTAL PARATHYROIDECTOMY WITH AUTOTRANSPLANTATION TO LEFT FOREARM;  Surgeon: Armandina Gemma, MD;  Location[facility] OR;  Service: General;  Laterality: N/A;   PARATHYROIDECTOMY N/A 10/23/2016   Procedure: PARATHYROIDECTOMY;  Surgeon: Armandina Gemma, MD;  Location: [place];  Service: General;  Laterality: N/A;   REVISON OF ARTERIOVENOUS FISTULA Right 07/16/2017   Procedure: REVISION OF ARTERIOVENOUS FISTULA  Right ARM;  Surgeon: Waynetta Sandy, MD;  Location: [place];  Service: Vascular;  Laterality: Right;   UPPER GASTROINTESTINAL ENDOSCOPY  2021   @ [facility]   Social History   Socioeconomic History   Marital status: Single    Spouse name: Not on file   Number of children: 2   Years of education: Not on file   Highest education level: Not on file  Occupational History   Occupation: retired    Fish farm manager: SELF EMPLOYED  Tobacco Use   Smoking status: Former    Pack years: 0.00    Types: Cigarettes    Quit date: 06/19/1990    Years since quitting: 30.1   Smokeless tobacco: Never   Tobacco comments:    rarely cigar  Vaping Use   Vaping Use: Never used  Substance and Sexual Activity   Alcohol use: Yes    Alcohol/week: 1.0 standard drink    Types: 1 Cans of beer per week    Comment: occasional   Drug use: Not Currently    Types: Marijuana   Sexual activity: Not on file  Other Topics Concern   Not on file  Social History Narrative   Lives alone at home   12 years of education    2 children    Not married    Social Determinants of Health   Financial Resource Strain: Not on file  Food Insecurity: Not on file  Transportation Needs: Not on file  Physical Activity: Not on file  Stress: Not on file  Social Connections: Not on file   Family History  Problem Relation Age of Onset   Diabetes Father    Stroke Father    Hypertension Father     Uterine cancer Mother    Lupus Sister    Stroke Sister    Hypertension Sister    Anuerysm Brother        brain   Colon cancer Neg Hx    Esophageal cancer Neg Hx    Stomach cancer Neg Hx    Pancreatic cancer Neg Hx    Liver disease Neg Hx    Colon polyps Neg Hx    Rectal cancer Neg Hx    Allergies  Allergen Reactions   Infed [Iron Dextran] Other (See Comments)    Decreased BP    Oxycodone Nausea Only   Vicodin [Hydrocodone-Acetaminophen] Other (See Comments)    Decreased BP   Whole Blood Other (See Comments)    Patient refuses whole blood "unless I am going  to die"   Hydrocodone-Acetaminophen     Other reaction(s): lower BP   Diclofenac Other (See Comments)    Unknown   Prior to Admission medications   Medication Sig Start Date End Date Taking? Authorizing Provider  acetaminophen (TYLENOL) 650 MG CR tablet Take 650 mg by mouth every 8 (eight) hours as needed for pain.   Yes [provider]  aspirin EC 81 MG tablet Take 1 tablet (81 mg total) by mouth daily. 08/09/19  Yes Pahwani, Einar Grad, MD  atorvastatin (LIPITOR) 10 MG tablet Take 10 mg by mouth daily.   Yes [provider]  cinacalcet (SENSIPAR) 60 MG tablet Take 120 mg by mouth daily.   Yes [provider]  cromolyn (OPTICROM) 4 % ophthalmic solution Place 1 drop into both eyes daily as needed (redness).   Yes [provider]  docusate sodium (COLACE) 100 MG capsule Take 100 mg by mouth 2 (two) times daily.   Yes [provider]  gabapentin (NEURONTIN) 100 MG capsule Take 100 mg by mouth 2 (two) times daily.  09/29/14  Yes [provider]  ibuprofen (ADVIL) 800 MG tablet Take 800 mg by mouth every 8 (eight) hours as needed for moderate pain. 12/11/19  Yes [provider]  Lanthanum Carbonate (FOSRENOL) 1000 MG PACK Take 1 Package by mouth 3 (three) times daily. Mix with applesauce at meals   Yes [provider]  LOKELMA 10 g PACK packet Take 1 packet by mouth  daily. 03/11/19  Yes [provider]  midodrine (PROAMATINE) 10 MG tablet Take 10 mg by mouth 3 (three) times a week. Prn pre dialysis 02/04/20  Yes [provider]  multivitamin (RENA-VIT) TABS tablet Take 1 tablet by mouth daily.   Yes [provider]  omeprazole (PRILOSEC) 20 MG capsule Take 40 mg by mouth in the morning and at bedtime.   Yes [provider]  oxyCODONE (OXY IR/ROXICODONE) 5 MG immediate release tablet Take 5 mg by mouth 2 (two) times daily as needed. 07/15/20  Yes [provider]  topiramate (TOPAMAX) 25 MG tablet Take 1 tablet (25 mg total) by mouth 2 (two) times daily. Patient taking differently: Take 25 mg by mouth daily. 03/08/20  Yes McCue, Janett Billow, NP  traMADol (ULTRAM) 50 MG tablet Take 50 mg by mouth every 6 (six) hours as needed for severe pain.   Yes [provider]  Turmeric (QC TUMERIC COMPLEX PO) Take 1 capsule by mouth daily. 08/06/19  Yes [provider]  vitamin E 180 MG (400 UNITS) capsule Take 400 Units by mouth daily.   Yes [provider]  atorvastatin (LIPITOR) 20 MG tablet Take 1 tablet (20 mg total) by mouth daily at 6 PM. Patient not taking: No sig reported 03/09/20   Frann Rider, NP    ROS: All other systems have been reviewed and were otherwise negative with the exception of those mentioned in the HPI and as above.  Physical Exam: General: Alert, no acute distress Cardiovascular: No pedal edema Respiratory: No cyanosis, no use of accessory musculature GI: No organomegaly, abdomen is soft and non-tender Skin: No lesions in the area of chief complaint Neurologic: Sensation intact distally Psychiatric: Patient is competent for consent with normal mood and affect Lymphatic: No axillary or cervical lymphadenopathy  MUSCULOSKELETAL:  Right shoulder: Active elevation to 45, passive to 90.  External rotation 10.  Essentially pseudoparalytic with 3/5 strength at the cuff.  Internal  rotation to back pocket.    Imaging:  MRI reviewed in canopy system demonstrating essentially a massive irreparable cuff tear past the level of the glenoid with significant bursitis and debris within the joint.  Additionally, he has significant arthritis in the joint as well.    Assessment: Arthritic changes in the setting of massive cuff tear.    Plan: Plan for Procedure(s): REVERSE SHOULDER ARTHROPLASTY APPLICATION OF CELL SAVER  Patient has multiple medical comorbidities.  However, he has a significant problem and is extremely debilitated by his shoulder.  His AV fistula in his right arm makes this extraordinarily complex.  We talked to the vascular surgeons and would have to be done [facility].  He has a high risk for bleeding.  He has denied blood products and if there is a significant amount of bleeding, which is possible, it could end his life.  He understands all this and still requests no blood products.  We will use a cell saver.  We will have vascular surgery on backup and alert them the morning of the case.  We have spoken to Dr. Donzetta Matters from vascular surgery who did his AV fistula years ago.  They feel reasonable about the plan.  He may end up needing grafting of his phallic vein, as it is in the field after the surgery.    The risks benefits and alternatives were discussed with the patient including but not limited to the risks of nonoperative treatment, versus surgical intervention including infection, bleeding, nerve injury,  blood clots, cardiopulmonary complications, morbidity, mortality, among others, and they were willing to proceed.   We additionally specifically discussed risks of axillary nerve injury, infection, periprosthetic fracture, continued pain and longevity of implants prior to beginning procedure.    Patient will be admitted for inpatient treatment for surgery, pain control, OT, prophylactic antibiotics, VTE prophylaxis, and discharge planning. The patient is planning  to be discharged home with outpatient PT.   The patient acknowledged the explanation, agreed to proceed with the plan and consent was signed.   He received operative clearance from PCP, Dr. Dorthy Cooler, and nephrologist, Dr. Royetta Crochet. He was scheduled for dialysis the day prior to surgery and again on Friday. Avoid tourniquet/compression/IV/BP checks on the right arm.   Will require nephrology consultation post-op.   Operative Plan: Right reverse total shoulder arthroplasty with use of cell saver Discharge Medications: Tylenol, Gabapentin, Oxycodone, Robaxin. Zofran DVT Prophylaxis: Ambulation. SCDs. Physical Therapy: Outpatient PT Special Discharge needs: Sling. IceMan. Cell Saver.    Ethelda Chick, PA-C  08/02/2020 5:08 PM

## 2020-08-02 NOTE — Anesthesia Preprocedure Evaluation (Deleted)
Anesthesia Evaluation    Reviewed: Allergy & Precautions, Patient's Chart, lab work & pertinent test results  Airway        Dental   Pulmonary sleep apnea (no CPAP since weight loss) , former smoker,           Cardiovascular hypertension, + Peripheral Vascular Disease       Neuro/Psych  Headaches, Seizures - (only one episode a few years ago 2/2 elevated K), Well Controlled,  PSYCHIATRIC DISORDERS Anxiety Depression TIACVA, No Residual Symptoms    GI/Hepatic Neg liver ROS, PUD, GERD  Medicated and Controlled,  Endo/Other  diabetes, Well Controlleda1c 5.3  Renal/GU ESRF and DialysisRenal diseaseK 4.4 yesterday  RUE fistula   negative genitourinary   Musculoskeletal  (+) Arthritis , Osteoarthritis,  Right shoulder DJD   Abdominal   Peds  Hematology  (+) Blood dyscrasia, anemia , REFUSES BLOOD PRODUCTS, hct 35.5, plt 275   Anesthesia Other Findings   Reproductive/Obstetrics negative OB ROS                            Anesthesia Physical Anesthesia Plan  ASA:   Anesthesia Plan:    Post-op Pain Management:    Induction:   PONV Risk Score and Plan:   Airway Management Planned:   Additional Equipment:   Intra-op Plan:   Post-operative Plan:   Informed Consent:   Plan Discussed with:   Anesthesia Plan Comments: (PAT note written 08/02/2020 by Myra Gianotti, PA-C. ESRD. Jehovah's Witness (to clarify/sign blood refusal form on the day of surgery). )        Anesthesia Quick Evaluation

## 2020-08-02 NOTE — Progress Notes (Addendum)
PCP - Dibas Koirala MD Cardiologist - Buford Dresser  PPM/ICD - denies Device Orders -  Rep Notified -   Chest x-ray - 06/20/20 EKG - 11/10/19 Stress Test - 03/25/18 ECHO - 10/03/15 Cardiac Cath - denies  Sleep Study - 06/23/12 CPAP - no  Fasting Blood Sugar - n/a Checks Blood Sugar _____ times a day  Blood Thinner Instructions:n/a Aspirin Instructions: Dentist.   ERAS Protcol -yes  PRE-SURGERY Ensure or G2- Ensure  COVID TEST- 08/01/20-negative   Anesthesia review: yes-neuro hx;renal hx -on transplant list.  Patient denies shortness of breath, fever, cough and chest pain at PAT appointment   All instructions explained to the patient, with a verbal understanding of the material. Patient agrees to go over the instructions while at home for a better understanding. Patient also instructed to self quarantine after being tested for COVID-19. The opportunity to ask questions was provided.

## 2020-08-02 NOTE — Pre-Procedure Instructions (Signed)
Surgical Instructions    Your procedure is scheduled on Wednesday, June 22nd.  Report to [facility] Main Entrance "A" at 12:30P.M., then check in with the Admitting office.  Call this number if you have problems the morning of surgery:  [phone]   If you have any questions prior to your surgery date call [phone]: Open Monday-Friday 8am-4pm    Remember:  Do not eat after midnight the night before your surgery.  You may drink clear liquids until 10:15 AM the morning of your surgery.   Clear liquids allowed are: Water, Non-Citrus Juices (without pulp), Carbonated Beverages, Clear Tea, Black Coffee Only, and Gatorade.    Enhanced Recovery after Surgery for Orthopedics Enhanced Recovery after Surgery is a protocol used to improve the stress on your body and your recovery after surgery.  Patient Instructions  The day of surgery (if you do NOT have diabetes):  Drink ONE (1) Pre-Surgery Clear Ensure by _10:15 AM the morning of surgery.   This drink was given to you during your hospital pre-op appointment visit. Nothing else to drink after completing the Pre-Surgery Clear Ensure.          If you have questions, please contact your surgeon's office.     Take these medicines the morning of surgery with A SIP OF WATER: atorvastatin (LIPITOR) cinacalcet (SENSIPAR)  gabapentin (NEURONTIN)  LOKELMA midodrine (PROAMATINE)  omeprazole (PRILOSEC) topiramate (TOPAMAX)   IF NEEDED: oxyCODONE (OXY IR/ROXICODONE) traMADol (ULTRAM)  cromolyn (OPTICROM) eye drops    *Follow your surgeon's instructions on when to stop Aspirin.  If no instructions were given by your surgeon then you will need to call the office to get those instructions.     As of today, STOP taking any Aleve, Naproxen, Ibuprofen, Motrin, Advil, Goody's, BC's, all herbal medications, fish oil, and all vitamins.   The Morning of Surgery:         Do not wear jewelry. Do not wear lotions, powders, colognes, or  deodorant. Men may shave face and neck. Do not bring valuables to the hospital.             [facility] is not responsible for any belongings or valuables.  Do NOT Smoke (Tobacco/Vaping) or drink Alcohol 24 hours prior to your procedure.  If you use a CPAP at night, you may bring all equipment for your overnight stay.   Contacts, glasses, dentures or bridgework may not be worn into surgery, please bring cases for these belongings   For patients admitted to the hospital, discharge time will be determined by your treatment team.   Patients discharged the day of surgery will not be allowed to drive home, and someone needs to stay with them for 24 hours.  ONLY 1 SUPPORT PERSON MAY BE PRESENT WHILE YOU ARE IN SURGERY. IF YOU ARE TO BE ADMITTED ONCE YOU ARE IN YOUR ROOM YOU WILL BE ALLOWED TWO (2) VISITORS.  Minor children may have two parents present. Special consideration for safety and communication needs will be reviewed on a case by case basis.      Special instructions:    Oral Hygiene is also important to reduce your risk of infection.  Remember - BRUSH YOUR TEETH THE MORNING OF SURGERY WITH YOUR REGULAR TOOTHPASTE                                    [place]- Preparing for Total Shoulder Arthroplasty  Before surgery, you can play an important role. Because skin is not sterile, your skin needs to be as free of germs as possible. You can reduce the number of germs on your skin by using the following products. Benzoyl Peroxide Gel Reduces the number of germs present on the skin Applied twice a day to shoulder area starting two days before surgery   Chlorhexidine Gluconate (CHG) Soap An antiseptic cleaner that kills germs and bonds with the skin to continue killing germs even after washing Used for showering the night before surgery and morning of surgery   Oral Hygiene is also important to reduce your risk of infection.                                    Remember - BRUSH YOUR  TEETH THE MORNING OF SURGERY WITH YOUR REGULAR TOOTHPASTE  ==================================================================  Please follow these instructions carefully:  BENZOYL PEROXIDE 5% GEL  Please do not use if you have an allergy to benzoyl peroxide.   If your skin becomes reddened/irritated stop using the benzoyl peroxide.  Starting two days before surgery, apply as follows: Apply benzoyl peroxide in the morning and at night. Apply after taking a shower. If you are not taking a shower clean entire shoulder front, back, and side along with the armpit with a clean wet washcloth.  Place a quarter-sized dollop on your shoulder and rub in thoroughly, making sure to cover the front, back, and side of your shoulder, along with the armpit.   2 days before ____ AM   ____ PM              1 day before ____ AM   ____ PM                             Do this twice a day for two days.  (Last application is the night before surgery, AFTER using the CHG soap as described below).  Do NOT apply benzoyl peroxide gel on the day of surgery.  CHLORHEXIDINE GLUCONATE (CHG) SOAP  Please do not use if you have an allergy to CHG or antibacterial soaps. If your skin becomes reddened/irritated stop using the CHG.   Do not shave (including legs and underarms) for at least 48 hours prior to first CHG shower. It is OK to shave your face.  Starting the night before surgery, use CHG soap as follows:  Shower the NIGHT BEFORE SURGERY and MORNING OF SURGERY with CHG.  If you choose to wash your hair, wash your hair first as usual with your normal shampoo.  After shampooing, rinse your hair and body thoroughly to remove the shampoo.  Use CHG as you would any other liquid soap.  You can apply CHG directly to the skin and wash gently with a scrungie or a clean washcloth.  Apply the CHG soap to your body ONLY FROM THE NECK DOWN.  Do not use on open wounds or open sores.  Avoid contact with your eyes, ears,  mouth, and genitals (private parts).  Wash face and genitals (private parts) with your normal soap.  Wash thoroughly, paying special attention to the area where your surgery will be performed.  Thoroughly rinse your body with warm water from the neck down.  DO NOT shower/wash with your normal soap after using and rinsing off the CHG soap.   Fraser Din  yourself dry with a CLEAN TOWEL.    Apply benzoyl peroxide.   Wear CLEAN PAJAMAS to bed the night before surgery; wear comfortable clothes the morning of surgery.  Place CLEAN SHEETS on your bed the night of your first shower and DO NOT SLEEP WITH PETS.  Day of Surgery: Shower as above Do not apply any deodorants/lotions.  Please wear clean clothes to the hospital/surgery center.   Remember to brush your teeth WITH YOUR REGULAR TOOTHPASTE.     Please read over the following fact sheets that you were given.

## 2020-08-03 ENCOUNTER — Ambulatory Visit (HOSPITAL_COMMUNITY)
Admission: RE | Admit: 2020-08-03 | Discharge: 2020-08-03 | Disposition: A | Discharge status: Home / Self Care | Payer: Medicare Other | Attending: Orthopaedic Surgery | Admitting: Orthopaedic Surgery

## 2020-08-03 ENCOUNTER — Encounter (HOSPITAL_COMMUNITY)
Admission: RE | Disposition: A | Discharge status: Home / Self Care | Payer: Self-pay | Source: Home / Self Care | Attending: Orthopaedic Surgery

## 2020-08-03 DIAGNOSIS — N2581 Secondary hyperparathyroidism of renal origin: Secondary | ICD-10-CM | POA: Diagnosis not present

## 2020-08-03 DIAGNOSIS — Z539 Procedure and treatment not carried out, unspecified reason: Secondary | ICD-10-CM | POA: Insufficient documentation

## 2020-08-03 DIAGNOSIS — M19011 Primary osteoarthritis, right shoulder: Secondary | ICD-10-CM | POA: Diagnosis not present

## 2020-08-03 DIAGNOSIS — Z992 Dependence on renal dialysis: Secondary | ICD-10-CM | POA: Diagnosis not present

## 2020-08-03 DIAGNOSIS — D631 Anemia in chronic kidney disease: Secondary | ICD-10-CM | POA: Diagnosis not present

## 2020-08-03 DIAGNOSIS — N186 End stage renal disease: Secondary | ICD-10-CM | POA: Diagnosis not present

## 2020-08-03 DIAGNOSIS — D509 Iron deficiency anemia, unspecified: Secondary | ICD-10-CM | POA: Diagnosis not present

## 2020-08-03 DIAGNOSIS — D689 Coagulation defect, unspecified: Secondary | ICD-10-CM | POA: Diagnosis not present

## 2020-08-03 SURGERY — ARTHROPLASTY, SHOULDER, TOTAL, REVERSE
Anesthesia: Choice | Site: Shoulder | Laterality: Right

## 2020-08-03 MED ORDER — ACETAMINOPHEN 500 MG PO TABS: 1000.0000 mg | ORAL_TABLET | Freq: Once | ORAL | Status: DC

## 2020-08-03 MED ORDER — SODIUM CHLORIDE 0.9 % IV SOLN: INTRAVENOUS | Status: DC

## 2020-08-03 MED ORDER — ORAL CARE MOUTH RINSE: 15.0000 mL | Freq: Once | OROMUCOSAL | Status: DC

## 2020-08-03 MED ORDER — LACTATED RINGERS IV SOLN: INTRAVENOUS | Status: DC

## 2020-08-03 MED ORDER — CEFAZOLIN SODIUM-DEXTROSE 2-4 GM/100ML-% IV SOLN: 2.0000 g | INTRAVENOUS | Status: DC

## 2020-08-03 MED ORDER — CHLORHEXIDINE GLUCONATE 0.12 % MT SOLN
15.0000 mL | Freq: Once | OROMUCOSAL | Status: DC
  Filled 2020-08-03: qty 15

## 2020-08-03 MED ORDER — CHLORHEXIDINE GLUCONATE 0.12 % MT SOLN: 15.0000 mL | Freq: Once | OROMUCOSAL | Status: DC

## 2020-08-03 MED ORDER — TRANEXAMIC ACID-NACL 1000-0.7 MG/100ML-% IV SOLN: 1000.0000 mg | INTRAVENOUS | Status: DC

## 2020-08-03 NOTE — Progress Notes (Addendum)
Dr. Griffin Basil informed patient that he would have to cancel his surgery today due to scheduling delays.  He spoke directly to the patient to inform him.   68 Walked patient to main entrance where his ride was awaiting him.

## 2020-08-05 DIAGNOSIS — N2581 Secondary hyperparathyroidism of renal origin: Secondary | ICD-10-CM | POA: Diagnosis not present

## 2020-08-05 DIAGNOSIS — N186 End stage renal disease: Secondary | ICD-10-CM | POA: Diagnosis not present

## 2020-08-05 DIAGNOSIS — D509 Iron deficiency anemia, unspecified: Secondary | ICD-10-CM | POA: Diagnosis not present

## 2020-08-05 DIAGNOSIS — D689 Coagulation defect, unspecified: Secondary | ICD-10-CM | POA: Diagnosis not present

## 2020-08-05 DIAGNOSIS — Z992 Dependence on renal dialysis: Secondary | ICD-10-CM | POA: Diagnosis not present

## 2020-08-05 DIAGNOSIS — D631 Anemia in chronic kidney disease: Secondary | ICD-10-CM | POA: Diagnosis not present

## 2020-08-08 DIAGNOSIS — Z992 Dependence on renal dialysis: Secondary | ICD-10-CM | POA: Diagnosis not present

## 2020-08-08 DIAGNOSIS — D689 Coagulation defect, unspecified: Secondary | ICD-10-CM | POA: Diagnosis not present

## 2020-08-08 DIAGNOSIS — N2581 Secondary hyperparathyroidism of renal origin: Secondary | ICD-10-CM | POA: Diagnosis not present

## 2020-08-08 DIAGNOSIS — N186 End stage renal disease: Secondary | ICD-10-CM | POA: Diagnosis not present

## 2020-08-10 DIAGNOSIS — M1612 Unilateral primary osteoarthritis, left hip: Secondary | ICD-10-CM | POA: Diagnosis not present

## 2020-08-10 DIAGNOSIS — M1712 Unilateral primary osteoarthritis, left knee: Secondary | ICD-10-CM | POA: Diagnosis not present

## 2020-08-11 DIAGNOSIS — N186 End stage renal disease: Secondary | ICD-10-CM | POA: Diagnosis not present

## 2020-08-11 DIAGNOSIS — Z992 Dependence on renal dialysis: Secondary | ICD-10-CM | POA: Diagnosis not present

## 2020-08-11 DIAGNOSIS — I12 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 stage renal disease: Secondary | ICD-10-CM | POA: Diagnosis not present

## 2020-08-12 DIAGNOSIS — D689 Coagulation defect, unspecified: Secondary | ICD-10-CM | POA: Diagnosis not present

## 2020-08-12 DIAGNOSIS — N2581 Secondary hyperparathyroidism of renal origin: Secondary | ICD-10-CM | POA: Diagnosis not present

## 2020-08-12 DIAGNOSIS — Z992 Dependence on renal dialysis: Secondary | ICD-10-CM | POA: Diagnosis not present

## 2020-08-12 DIAGNOSIS — R519 Headache, unspecified: Secondary | ICD-10-CM | POA: Diagnosis not present

## 2020-08-12 DIAGNOSIS — D509 Iron deficiency anemia, unspecified: Secondary | ICD-10-CM | POA: Diagnosis not present

## 2020-08-12 DIAGNOSIS — N186 End stage renal disease: Secondary | ICD-10-CM | POA: Diagnosis not present

## 2020-08-12 DIAGNOSIS — E875 Hyperkalemia: Secondary | ICD-10-CM | POA: Diagnosis not present

## 2020-08-12 DIAGNOSIS — D631 Anemia in chronic kidney disease: Secondary | ICD-10-CM | POA: Diagnosis not present

## 2020-08-15 DIAGNOSIS — E875 Hyperkalemia: Secondary | ICD-10-CM | POA: Diagnosis not present

## 2020-08-15 DIAGNOSIS — D509 Iron deficiency anemia, unspecified: Secondary | ICD-10-CM | POA: Diagnosis not present

## 2020-08-15 DIAGNOSIS — D689 Coagulation defect, unspecified: Secondary | ICD-10-CM | POA: Diagnosis not present

## 2020-08-15 DIAGNOSIS — R519 Headache, unspecified: Secondary | ICD-10-CM | POA: Diagnosis not present

## 2020-08-15 DIAGNOSIS — N186 End stage renal disease: Secondary | ICD-10-CM | POA: Diagnosis not present

## 2020-08-15 DIAGNOSIS — N2581 Secondary hyperparathyroidism of renal origin: Secondary | ICD-10-CM | POA: Diagnosis not present

## 2020-08-15 DIAGNOSIS — Z992 Dependence on renal dialysis: Secondary | ICD-10-CM | POA: Diagnosis not present

## 2020-08-15 DIAGNOSIS — D631 Anemia in chronic kidney disease: Secondary | ICD-10-CM | POA: Diagnosis not present

## 2020-08-17 DIAGNOSIS — E875 Hyperkalemia: Secondary | ICD-10-CM | POA: Diagnosis not present

## 2020-08-17 DIAGNOSIS — N2581 Secondary hyperparathyroidism of renal origin: Secondary | ICD-10-CM | POA: Diagnosis not present

## 2020-08-17 DIAGNOSIS — R519 Headache, unspecified: Secondary | ICD-10-CM | POA: Diagnosis not present

## 2020-08-17 DIAGNOSIS — Z992 Dependence on renal dialysis: Secondary | ICD-10-CM | POA: Diagnosis not present

## 2020-08-17 DIAGNOSIS — N186 End stage renal disease: Secondary | ICD-10-CM | POA: Diagnosis not present

## 2020-08-17 DIAGNOSIS — D631 Anemia in chronic kidney disease: Secondary | ICD-10-CM | POA: Diagnosis not present

## 2020-08-17 DIAGNOSIS — D689 Coagulation defect, unspecified: Secondary | ICD-10-CM | POA: Diagnosis not present

## 2020-08-17 DIAGNOSIS — D509 Iron deficiency anemia, unspecified: Secondary | ICD-10-CM | POA: Diagnosis not present

## 2020-08-18 DIAGNOSIS — M1712 Unilateral primary osteoarthritis, left knee: Secondary | ICD-10-CM | POA: Diagnosis not present

## 2020-08-19 DIAGNOSIS — D631 Anemia in chronic kidney disease: Secondary | ICD-10-CM | POA: Diagnosis not present

## 2020-08-19 DIAGNOSIS — R519 Headache, unspecified: Secondary | ICD-10-CM | POA: Diagnosis not present

## 2020-08-19 DIAGNOSIS — N186 End stage renal disease: Secondary | ICD-10-CM | POA: Diagnosis not present

## 2020-08-19 DIAGNOSIS — N2581 Secondary hyperparathyroidism of renal origin: Secondary | ICD-10-CM | POA: Diagnosis not present

## 2020-08-19 DIAGNOSIS — D689 Coagulation defect, unspecified: Secondary | ICD-10-CM | POA: Diagnosis not present

## 2020-08-19 DIAGNOSIS — E875 Hyperkalemia: Secondary | ICD-10-CM | POA: Diagnosis not present

## 2020-08-19 DIAGNOSIS — Z992 Dependence on renal dialysis: Secondary | ICD-10-CM | POA: Diagnosis not present

## 2020-08-19 DIAGNOSIS — D509 Iron deficiency anemia, unspecified: Secondary | ICD-10-CM | POA: Diagnosis not present

## 2020-08-22 ENCOUNTER — Other Ambulatory Visit (HOSPITAL_COMMUNITY)
Admission: RE | Admit: 2020-08-22 | Discharge: 2020-08-22 | Disposition: A | Discharge status: Home / Self Care | Payer: Medicare Other | Source: Ambulatory Visit | Attending: Orthopaedic Surgery | Admitting: Orthopaedic Surgery

## 2020-08-22 ENCOUNTER — Encounter (HOSPITAL_COMMUNITY): Payer: Self-pay | Admitting: Orthopaedic Surgery

## 2020-08-22 DIAGNOSIS — D689 Coagulation defect, unspecified: Secondary | ICD-10-CM | POA: Diagnosis not present

## 2020-08-22 DIAGNOSIS — E875 Hyperkalemia: Secondary | ICD-10-CM | POA: Diagnosis not present

## 2020-08-22 DIAGNOSIS — Z01812 Encounter for preprocedural laboratory examination: Secondary | ICD-10-CM | POA: Insufficient documentation

## 2020-08-22 DIAGNOSIS — D509 Iron deficiency anemia, unspecified: Secondary | ICD-10-CM | POA: Diagnosis not present

## 2020-08-22 DIAGNOSIS — R519 Headache, unspecified: Secondary | ICD-10-CM | POA: Diagnosis not present

## 2020-08-22 DIAGNOSIS — Z20822 Contact with and (suspected) exposure to covid-19: Secondary | ICD-10-CM | POA: Diagnosis not present

## 2020-08-22 DIAGNOSIS — N186 End stage renal disease: Secondary | ICD-10-CM | POA: Diagnosis not present

## 2020-08-22 DIAGNOSIS — Z992 Dependence on renal dialysis: Secondary | ICD-10-CM | POA: Diagnosis not present

## 2020-08-22 DIAGNOSIS — N2581 Secondary hyperparathyroidism of renal origin: Secondary | ICD-10-CM | POA: Diagnosis not present

## 2020-08-22 DIAGNOSIS — D631 Anemia in chronic kidney disease: Secondary | ICD-10-CM | POA: Diagnosis not present

## 2020-08-22 LAB — SARS CORONAVIRUS 2 (TAT 6-24 HRS): SARS Coronavirus 2: NEGATIVE

## 2020-08-22 NOTE — Progress Notes (Signed)
Anesthesia follow-up:  See my previous Anesthesia APP Progress Note from Date of Service visit 08/02/20. Patient was scheduled for right reverse total shoulder arthroplasty by Dr. Griffin Basil on 08/03/20, but case was cancelled due to scheduling delays.   Last ASA 08/10/20. + ESRD and renal cancer. Does not use CPAP after weight loss. He is a Restaurant manager, fast food.   Last labs are from 08/02/20. H/H 10.9/35.5. He will get at least some updated labs on the day of surgery given ESRD.   08/22/20 presurgical COVID-19 test is in process.   Myra Gianotti, PA-C Surgical Short Stay/Anesthesiology [facility] Phone [phone] [facility] Phone [phone] 08/22/2020 1:13 PM

## 2020-08-22 NOTE — Progress Notes (Signed)
DUE TO COVID-19 ONLY ONE VISITOR IS ALLOWED TO COME WITH YOU AND STAY IN THE WAITING ROOM ONLY DURING PRE OP AND PROCEDURE DAY OF SURGERY. Two  VISITORS  MAY VISIT WITH YOU AFTER SURGERY IN YOUR PRIVATE ROOM DURING VISITING HOURS ONLY!   Patient denies shortness of breath, fever, cough or chest pain.  PCP - Dr Lauretta Grill Koirala Cardiologist - Dr Buford Dresser Neurology - Frann Rider. NP  Chest x-ray - 06/17/20 (2V) EKG - 11/10/19 Stress Test - 06/13/10 ECHO - 10/03/15, 09/29/19 [facility] Cardiac Cath - n/a  Sleep Study -  Yes CPAP - Does not use cpap (lost weight)  Aspirin Instructions: Last dose of ASA was on 08/10/20 per patient.  Anesthesia review: Yes  STOP now taking any Aspirin (unless otherwise instructed by your surgeon), Aleve, Naproxen, Ibuprofen, Motrin, Advil, Goody's, BC's, all herbal medications, fish oil, and all vitamins.   Coronavirus Screening Covid test is scheduled on 09/02/20 at 2:15 pm. Do you have any of the following symptoms:  Cough yes/no: No Fever (>100.108F)  yes/no: No Runny nose yes/no: No Sore throat yes/no: No Difficulty breathing/shortness of breath  yes/no: No  Have you traveled in the last 14 days and where? yes/no: No  Patient verbalized understanding of instructions that were given via phone.

## 2020-08-23 NOTE — H&P (Signed)
PREOPERATIVE H&P  Chief Complaint: RIGHT SHOULDER DJD  HPI: Carl Prosperi Sr. is a 60 y.o. male who presents for preoperative history and physical prior to scheduled surgery, Procedure(s): REVERSE SHOULDER ARTHROPLASTY APPLICATION OF CELL SAVER.   Patient has a past medical history significant for former smoker, ESRD - RUA AVF, on dialysis, left renal cell cancer, HTN, borderline DM, secondary hyperparathyroidism, CVA (2016), anemia, GERD, GI bleed (likely diverticular bleed 06/2014, near perforated cecal mass 07/2015; gastric ulcer 07/2019), cecal mass (s/p laparoscopic right hemicolectomy 08/05/15, pathology, inflammatory mass with ulceration/adhesions, no malignancy), seizure (2017, likely related to electrolyte abnormalities from HD non-compliance), chronic headaches, OSA  no CPAP after weight loss), REFUSAL OF BLOOD PRODUCTS ([place])..   Patient is a 60 year-old on hemodialysis with multiple medical comorbidities, including right TIA, GI bleed, ESRD, gout, diabetes and hypertension.  He has had a significant amount of right shoulder pain for many months.  He has not made much improvement with non-operative management including injections and home exercise program. He continues to be quite debilitated by his shoulder.  He is worried about his ability to function actively.  His symptoms are rated as moderate to severe, and have been worsening.  This is significantly impairing activities of daily living.    Please see clinic note for further details on this patient's care.    He has elected for surgical management.   Past Medical History:  Diagnosis Date   Anemia    hx of   Anxiety    situational    Arthritis    on meds   Borderline diabetes    Cataract    bilateral sx   Chronic headaches    Depression    situational    Diverticulitis    ESRD (end stage renal disease) ([place])     On Renal Transplant List," [facility]; MWF" (10/23/2016)   GERD (gastroesophageal reflux  disease)    with certain foods   GI bleed    Hypertension    diet controlled   Parathyroid abnormality ([place])    ectopic parathyroid gland   Presence of arteriovenous fistula for hemodialysis, primary ([place])    RUE   Refusal of blood product    NO WHOLE BLOOD PROUCTS   Renal cell carcinoma ([place])    s/p hand assisted laparoscopic bilateral nephrectomies 11/29/17, + RCC left   Secondary hyperparathyroidism ([place])    Seizures ([place])    one episode in past, due to" elevated Potassium" 08/02/20- "at least 4 years ago"   Sleep apnea    doesn't use CPAP anymore since weight loss   Stroke [facility])    no residual   Past Surgical History:  Procedure Laterality Date   AV FISTULA PLACEMENT Right    right arm   BIOPSY  08/01/2019   Procedure: BIOPSY;  Surgeon: Juanita Craver, MD;  Location: [place];  Service: Endoscopy;;   CATARACT EXTRACTION W/ INTRAOCULAR LENS  IMPLANT, BILATERAL     COLON SURGERY     COLONOSCOPY N/A 08/04/2015   Procedure: COLONOSCOPY;  Surgeon: Jerene Bears, MD;  Location: [facility] ENDOSCOPY;  Service: Endoscopy;  Laterality: N/A;   COLONOSCOPY  2017   JMP@ [facility]-good prep-mass -recall 1 yr   ESOPHAGOGASTRODUODENOSCOPY N/A 08/01/2019   Procedure: ESOPHAGOGASTRODUODENOSCOPY (EGD);  Surgeon: Juanita Craver, MD;  Location: [facility] ENDOSCOPY;  Service: Endoscopy;  Laterality: N/A;   ESOPHAGOGASTRODUODENOSCOPY (EGD) WITH PROPOFOL N/A 08/04/2019   Procedure: ESOPHAGOGASTRODUODENOSCOPY (EGD) WITH PROPOFOL;  Surgeon: Yetta Flock, MD;  Location: [facility]  ENDOSCOPY;  Service: Gastroenterology;  Laterality: N/A;   graft left arm Left    for dialysis x 2. Removed   INSERTION OF DIALYSIS CATHETER     Rt chest   LAPAROSCOPIC RIGHT COLECTOMY N/A 08/05/2015   Procedure: LAPAROSCOPIC RIGHT COLECTOMY- ASCENDING;  Surgeon: Stark Klein, MD;  Location: [place];  Service: General;  Laterality: N/A;   MASS EXCISION Left 05/28/2019   Procedure: EXCISION SOFT TISSUE MASS LEFT SHOULDER;  Surgeon: Armandina Gemma, MD;  Location: [facility] ORS;  Service: General;  Laterality: Left;   NEPHRECTOMY Bilateral    PARATHYROIDECTOMY N/A 06/12/2016   Procedure: TOTAL PARATHYROIDECTOMY WITH AUTOTRANSPLANTATION TO LEFT FOREARM;  Surgeon: Armandina Gemma, MD;  Location: [facility] OR;  Service: General;  Laterality: N/A;   PARATHYROIDECTOMY N/A 10/23/2016   Procedure: PARATHYROIDECTOMY;  Surgeon: Armandina Gemma, MD;  Location: [place];  Service: General;  Laterality: N/A;   REVISON OF ARTERIOVENOUS FISTULA Right 07/16/2017   Procedure: REVISION OF ARTERIOVENOUS FISTULA  Right ARM;  Surgeon: Waynetta Sandy, MD;  Location: [place];  Service: Vascular;  Laterality: Right;   UPPER GASTROINTESTINAL ENDOSCOPY  2021   @ [facility]   Social History   Socioeconomic History   Marital status: Single    Spouse name: Not on file   Number of children: 2   Years of education: Not on file   Highest education level: Not on file  Occupational History   Occupation: retired    Fish farm manager: SELF EMPLOYED  Tobacco Use   Smoking status: Former    Pack years: 0.00    Types: Cigarettes    Quit date: 06/19/1990    Years since quitting: 30.2   Smokeless tobacco: Never   Tobacco comments:    rarely cigar  Vaping Use   Vaping Use: Never used  Substance and Sexual Activity   Alcohol use: Yes    Alcohol/week: 1.0 standard drink    Types: 1 Cans of beer per week    Comment: occasional beer   Drug use: Not Currently    Types: Marijuana    Comment: Last use several yrs ago   Sexual activity: Not on file  Other Topics Concern   Not on file  Social History Narrative   Lives alone at home   12 years of education    2 children    Not married    Social Determinants of Health   Financial Resource Strain: Not on file  Food Insecurity: Not on file  Transportation Needs: Not on file  Physical Activity: Not on file  Stress: Not on file  Social Connections: Not on file   Family History  Problem Relation Age of Onset   Diabetes Father     Stroke Father    Hypertension Father    Uterine cancer Mother    Lupus Sister    Stroke Sister    Hypertension Sister    Anuerysm Brother        brain   Colon cancer Neg Hx    Esophageal cancer Neg Hx    Stomach cancer Neg Hx    Pancreatic cancer Neg Hx    Liver disease Neg Hx    Colon polyps Neg Hx    Rectal cancer Neg Hx    Allergies  Allergen Reactions   Infed [Iron Dextran] Other (See Comments)    Decreased BP    Oxycodone Nausea Only   Vicodin [Hydrocodone-Acetaminophen] Other (See Comments)    Decreased BP   Whole Blood Other (See Comments)  Patient refuses whole blood "unless I am going to die"   Hydrocodone-Acetaminophen     Other reaction(s): lower BP   Diclofenac Other (See Comments)    Unknown   Prior to Admission medications   Medication Sig Start Date End Date Taking? Authorizing Provider  acetaminophen (TYLENOL) 650 MG CR tablet Take 650 mg by mouth every 8 (eight) hours as needed for pain.   Yes [provider]  aspirin EC 81 MG tablet Take 1 tablet (81 mg total) by mouth daily. 08/09/19  Yes Pahwani, Einar Grad, MD  atorvastatin (LIPITOR) 10 MG tablet Take 10 mg by mouth daily.   Yes [provider]  cinacalcet (SENSIPAR) 60 MG tablet Take 120 mg by mouth daily.   Yes [provider]  cromolyn (OPTICROM) 4 % ophthalmic solution Place 1 drop into both eyes daily as needed (redness).   Yes [provider]  docusate sodium (COLACE) 100 MG capsule Take 100 mg by mouth 2 (two) times daily.   Yes [provider]  gabapentin (NEURONTIN) 100 MG capsule Take 100 mg by mouth 2 (two) times daily.  09/29/14  Yes [provider]  ibuprofen (ADVIL) 800 MG tablet Take 800 mg by mouth every 8 (eight) hours as needed for moderate pain. 12/11/19  Yes [provider]  Lanthanum Carbonate (FOSRENOL) 1000 MG PACK Take 1 Package by mouth 3 (three) times daily. Mix with applesauce at meals   Yes [provider]   LOKELMA 10 g PACK packet Take 1 packet by mouth daily. 03/11/19  Yes [provider]  midodrine (PROAMATINE) 10 MG tablet Take 10 mg by mouth 3 (three) times a week. Prn pre dialysis 02/04/20  Yes [provider]  multivitamin (RENA-VIT) TABS tablet Take 1 tablet by mouth daily.   Yes [provider]  omeprazole (PRILOSEC) 20 MG capsule Take 40 mg by mouth in the morning and at bedtime.   Yes [provider]  oxyCODONE (OXY IR/ROXICODONE) 5 MG immediate release tablet Take 5 mg by mouth 2 (two) times daily as needed. 07/15/20  Yes [provider]  topiramate (TOPAMAX) 25 MG tablet Take 1 tablet (25 mg total) by mouth 2 (two) times daily. Patient taking differently: Take 25 mg by mouth daily. 03/08/20  Yes McCue, Janett Billow, NP  traMADol (ULTRAM) 50 MG tablet Take 50 mg by mouth every 6 (six) hours as needed for severe pain.   Yes [provider]  Turmeric (QC TUMERIC COMPLEX PO) Take 1 capsule by mouth daily. 08/06/19  Yes [provider]  vitamin E 180 MG (400 UNITS) capsule Take 400 Units by mouth daily.   Yes [provider]  atorvastatin (LIPITOR) 20 MG tablet Take 1 tablet (20 mg total) by mouth daily at 6 PM. Patient not taking: No sig reported 03/09/20   Frann Rider, NP    ROS: All other systems have been reviewed and were otherwise negative with the exception of those mentioned in the HPI and as above.  Physical Exam: General: Alert, no acute distress Cardiovascular: No pedal edema Respiratory: No cyanosis, no use of accessory musculature GI: No organomegaly, abdomen is soft and non-tender Skin: No lesions in the area of chief complaint Neurologic: Sensation intact distally Psychiatric: Patient is competent for consent with normal mood and affect Lymphatic: No axillary or cervical lymphadenopathy  MUSCULOSKELETAL:  Right shoulder: Active elevation to 45, passive to 90.  External rotation 10.  Essentially  pseudoparalytic with 3/5 strength at the cuff.  Internal  rotation to back pocket.    Imaging: MRI reviewed in canopy system demonstrating essentially a massive irreparable cuff tear past the level of the glenoid with significant bursitis and debris within the joint.  Additionally, he has significant arthritis in the joint as well.    Assessment: Arthritic changes in the setting of massive cuff tear.    Plan: Plan for Procedure(s): REVERSE SHOULDER ARTHROPLASTY APPLICATION OF CELL SAVER  Patient has multiple medical comorbidities.  However, he has a significant problem and is extremely debilitated by his shoulder.  His AV fistula in his right arm makes this extraordinarily complex.  We talked to the vascular surgeons and would have to be done [facility]  He has a high risk for bleeding.  He has denied blood products and if there is a significant amount of bleeding, which is possible, it could end his life.  He understands all this and still requests no blood products.  We will use a cell saver.  We will have vascular surgery on backup and alert them the morning of the case.  We have spoken to Dr. Donzetta Matters from vascular surgery who did his AV fistula years ago.  They feel reasonable about the plan.  He may end up needing grafting of his phallic vein, as it is in the field after the surgery.    The risks benefits and alternatives were discussed with the patient including but not limited to the risks of nonoperative treatment, versus surgical intervention including infection, bleeding, nerve injury,  blood clots, cardiopulmonary complications, morbidity, mortality, among others, and they were willing to proceed.   We additionally specifically discussed risks of axillary nerve injury, infection, periprosthetic fracture, continued pain and longevity of implants prior to beginning procedure.    Patient will be admitted for inpatient treatment for surgery, pain control, OT, prophylactic antibiotics, VTE  prophylaxis, and discharge planning. The patient is planning to be discharged home with outpatient PT.   The patient acknowledged the explanation, agreed to proceed with the plan and consent was signed.   He received operative clearance from PCP, Dr. Dorthy Cooler, and nephrologist, Dr. Royetta Crochet. He was scheduled for dialysis the day prior to surgery and again on Friday. Avoid tourniquet/compression/IV/BP checks on the right arm.   Will require nephrology consultation post-op.   Operative Plan: Right reverse total shoulder arthroplasty with use of cell saver Discharge Medications: Tylenol, Gabapentin, Oxycodone, Robaxin. Zofran DVT Prophylaxis: Ambulation. SCDs. Physical Therapy: Outpatient PT Special Discharge needs: Sling. IceMan. Cell Saver.    Ethelda Chick, PA-C  08/23/2020 7:11 AM

## 2020-08-23 NOTE — Anesthesia Preprocedure Evaluation (Addendum)
Anesthesia Evaluation  Patient identified by MRN, date of birth, ID band Patient awake    Reviewed: Allergy & Precautions, NPO status , Patient's Chart, lab work & pertinent test results  Airway Mallampati: III  TM Distance: >3 FB Neck ROM: Full    Dental  (+) Dental Advisory Given, Partial Upper   Pulmonary sleep apnea , former smoker,    Pulmonary exam normal breath sounds clear to auscultation       Cardiovascular hypertension, (-) angina+ Peripheral Vascular Disease  (-) Past MI Normal cardiovascular exam Rhythm:Regular Rate:Normal     Neuro/Psych  Headaches, Seizures -,  PSYCHIATRIC DISORDERS Anxiety Depression TIA Neuromuscular disease CVA, No Residual Symptoms    GI/Hepatic Neg liver ROS, PUD, GERD  Medicated,  Endo/Other  diabetes, Type 2  Renal/GU ESRF and DialysisRenal disease (RCC s/p bilateral nephrectomies)     Musculoskeletal  (+) Arthritis ,   Abdominal   Peds  Hematology   Anesthesia Other Findings   Reproductive/Obstetrics                           Anesthesia Physical Anesthesia Plan  ASA: 3  Anesthesia Plan: General   Post-op Pain Management:  Regional for Post-op pain   Induction: Intravenous  PONV Risk Score and Plan: 2 and Midazolam, Dexamethasone and Ondansetron  Airway Management Planned: Oral ETT  Additional Equipment:   Intra-op Plan:   Post-operative Plan: Extubation in OR  Informed Consent: I have reviewed the patients History and Physical, chart, labs and discussed the procedure including the risks, benefits and alternatives for the proposed anesthesia with the patient or authorized representative who has indicated his/her understanding and acceptance.     Dental advisory given  Plan Discussed with: CRNA  Anesthesia Plan Comments:        Anesthesia Quick Evaluation

## 2020-08-24 ENCOUNTER — Other Ambulatory Visit: Payer: Self-pay

## 2020-08-24 ENCOUNTER — Ambulatory Visit (HOSPITAL_COMMUNITY): Payer: Medicare Other | Admitting: Physician Assistant

## 2020-08-24 ENCOUNTER — Observation Stay (HOSPITAL_COMMUNITY)
Admission: RE | Admit: 2020-08-24 | Discharge: 2020-08-25 | Disposition: A | Discharge status: Home / Self Care | Payer: Medicare Other | Source: Ambulatory Visit | Attending: Orthopaedic Surgery | Admitting: Orthopaedic Surgery

## 2020-08-24 ENCOUNTER — Encounter (HOSPITAL_COMMUNITY): Payer: Self-pay | Admitting: Orthopaedic Surgery

## 2020-08-24 ENCOUNTER — Encounter (HOSPITAL_COMMUNITY)
Admission: RE | Disposition: A | Discharge status: Home / Self Care | Payer: Self-pay | Source: Ambulatory Visit | Attending: Orthopaedic Surgery

## 2020-08-24 ENCOUNTER — Observation Stay (HOSPITAL_COMMUNITY): Payer: Medicare Other

## 2020-08-24 DIAGNOSIS — Z992 Dependence on renal dialysis: Secondary | ICD-10-CM | POA: Diagnosis not present

## 2020-08-24 DIAGNOSIS — M75101 Unspecified rotator cuff tear or rupture of right shoulder, not specified as traumatic: Principal | ICD-10-CM | POA: Insufficient documentation

## 2020-08-24 DIAGNOSIS — Z471 Aftercare following joint replacement surgery: Secondary | ICD-10-CM | POA: Diagnosis not present

## 2020-08-24 DIAGNOSIS — Z79899 Other long term (current) drug therapy: Secondary | ICD-10-CM | POA: Diagnosis not present

## 2020-08-24 DIAGNOSIS — E875 Hyperkalemia: Secondary | ICD-10-CM | POA: Diagnosis not present

## 2020-08-24 DIAGNOSIS — Z8673 Personal history of transient ischemic attack (TIA), and cerebral infarction without residual deficits: Secondary | ICD-10-CM | POA: Insufficient documentation

## 2020-08-24 DIAGNOSIS — Z09 Encounter for follow-up examination after completed treatment for conditions other than malignant neoplasm: Secondary | ICD-10-CM

## 2020-08-24 DIAGNOSIS — E1122 Type 2 diabetes mellitus with diabetic chronic kidney disease: Secondary | ICD-10-CM | POA: Diagnosis not present

## 2020-08-24 DIAGNOSIS — Z87891 Personal history of nicotine dependence: Secondary | ICD-10-CM | POA: Diagnosis not present

## 2020-08-24 DIAGNOSIS — N186 End stage renal disease: Secondary | ICD-10-CM | POA: Insufficient documentation

## 2020-08-24 DIAGNOSIS — T8182XA Emphysema (subcutaneous) resulting from a procedure, initial encounter: Secondary | ICD-10-CM | POA: Diagnosis not present

## 2020-08-24 DIAGNOSIS — Z85528 Personal history of other malignant neoplasm of kidney: Secondary | ICD-10-CM | POA: Insufficient documentation

## 2020-08-24 DIAGNOSIS — Z7982 Long term (current) use of aspirin: Secondary | ICD-10-CM | POA: Diagnosis not present

## 2020-08-24 DIAGNOSIS — I12 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 stage renal disease: Secondary | ICD-10-CM | POA: Diagnosis not present

## 2020-08-24 DIAGNOSIS — E7800 Pure hypercholesterolemia, unspecified: Secondary | ICD-10-CM | POA: Diagnosis not present

## 2020-08-24 DIAGNOSIS — M19011 Primary osteoarthritis, right shoulder: Secondary | ICD-10-CM | POA: Diagnosis not present

## 2020-08-24 DIAGNOSIS — Z96611 Presence of right artificial shoulder joint: Secondary | ICD-10-CM

## 2020-08-24 DIAGNOSIS — G8918 Other acute postprocedural pain: Secondary | ICD-10-CM | POA: Diagnosis not present

## 2020-08-24 HISTORY — PX: REVERSE SHOULDER ARTHROPLASTY: SHX5054

## 2020-08-24 LAB — PROTIME-INR
INR: 1.1 (ref 0.8–1.2)
Prothrombin Time: 14.4 seconds (ref 11.4–15.2)

## 2020-08-24 LAB — POCT I-STAT, CHEM 8
BUN: 64 mg/dL — ABNORMAL HIGH (ref 8–23)
Calcium, Ion: 1.03 mmol/L — ABNORMAL LOW (ref 1.15–1.40)
Chloride: 102 mmol/L (ref 98–111)
Creatinine, Ser: 13.3 mg/dL — ABNORMAL HIGH (ref 0.61–1.24)
Glucose, Bld: 69 mg/dL — ABNORMAL LOW (ref 70–99)
HCT: 33 % — ABNORMAL LOW (ref 39.0–52.0)
Hemoglobin: 11.2 g/dL — ABNORMAL LOW (ref 13.0–17.0)
Potassium: 4.6 mmol/L (ref 3.5–5.1)
Sodium: 135 mmol/L (ref 135–145)
TCO2: 21 mmol/L — ABNORMAL LOW (ref 22–32)

## 2020-08-24 LAB — NO BLOOD PRODUCTS

## 2020-08-24 LAB — APTT: aPTT: 30 seconds (ref 24–36)

## 2020-08-24 SURGERY — ARTHROPLASTY, SHOULDER, TOTAL, REVERSE
Anesthesia: General | Site: Shoulder | Laterality: Right

## 2020-08-24 MED ORDER — PHENOL 1.4 % MT LIQD: 1.0000 | OROMUCOSAL | Status: DC | PRN

## 2020-08-24 MED ORDER — DIPHENHYDRAMINE HCL 12.5 MG/5ML PO ELIX: 12.5000 mg | ORAL_SOLUTION | ORAL | Status: DC | PRN

## 2020-08-24 MED ORDER — ATORVASTATIN CALCIUM 10 MG PO TABS
10.0000 mg | ORAL_TABLET | Freq: Every day | ORAL | Status: DC
  Administered 2020-08-24 – 2020-08-25 (×2): 10 mg via ORAL
  Filled 2020-08-24 (×2): qty 1

## 2020-08-24 MED ORDER — VANCOMYCIN HCL 1000 MG IV SOLR
INTRAVENOUS | Status: DC | PRN
  Administered 2020-08-24: 1000 mg

## 2020-08-24 MED ORDER — TRANEXAMIC ACID-NACL 1000-0.7 MG/100ML-% IV SOLN
1000.0000 mg | INTRAVENOUS | Status: AC
  Administered 2020-08-24: 1000 mg via INTRAVENOUS
  Filled 2020-08-24: qty 100

## 2020-08-24 MED ORDER — LANTHANUM CARBONATE 1000 MG PO PACK: 1.0000 | PACK | Freq: Three times a day (TID) | ORAL | Status: DC

## 2020-08-24 MED ORDER — ONDANSETRON HCL 4 MG/2ML IJ SOLN
INTRAMUSCULAR | Status: AC
  Filled 2020-08-24: qty 2

## 2020-08-24 MED ORDER — OXYCODONE HCL 5 MG PO TABS: 10.0000 mg | ORAL_TABLET | ORAL | Status: DC | PRN

## 2020-08-24 MED ORDER — CEFAZOLIN SODIUM-DEXTROSE 2-4 GM/100ML-% IV SOLN
2.0000 g | INTRAVENOUS | Status: AC
  Administered 2020-08-24: 2 g via INTRAVENOUS
  Filled 2020-08-24: qty 100

## 2020-08-24 MED ORDER — GABAPENTIN 100 MG PO CAPS
100.0000 mg | ORAL_CAPSULE | Freq: Two times a day (BID) | ORAL | Status: DC
  Administered 2020-08-24 – 2020-08-25 (×2): 100 mg via ORAL
  Filled 2020-08-24 (×2): qty 1

## 2020-08-24 MED ORDER — TRANEXAMIC ACID-NACL 1000-0.7 MG/100ML-% IV SOLN: 1000.0000 mg | Freq: Once | INTRAVENOUS | Status: DC

## 2020-08-24 MED ORDER — LIDOCAINE 2% (20 MG/ML) 5 ML SYRINGE
INTRAMUSCULAR | Status: DC | PRN
  Administered 2020-08-24: 100 mg via INTRAVENOUS

## 2020-08-24 MED ORDER — SUGAMMADEX SODIUM 200 MG/2ML IV SOLN
INTRAVENOUS | Status: DC | PRN
  Administered 2020-08-24: 200 mg via INTRAVENOUS

## 2020-08-24 MED ORDER — ACETAMINOPHEN 500 MG PO TABS
1000.0000 mg | ORAL_TABLET | Freq: Once | ORAL | Status: AC
  Administered 2020-08-24: 1000 mg via ORAL
  Filled 2020-08-24: qty 2

## 2020-08-24 MED ORDER — ONDANSETRON HCL 4 MG/2ML IJ SOLN: 4.0000 mg | Freq: Once | INTRAMUSCULAR | Status: DC | PRN

## 2020-08-24 MED ORDER — LANTHANUM CARBONATE 500 MG PO CHEW
1000.0000 mg | CHEWABLE_TABLET | Freq: Three times a day (TID) | ORAL | Status: DC
  Filled 2020-08-24: qty 2

## 2020-08-24 MED ORDER — PHENYLEPHRINE HCL-NACL 10-0.9 MG/250ML-% IV SOLN
INTRAVENOUS | Status: DC | PRN
  Administered 2020-08-24: 20 ug/min via INTRAVENOUS

## 2020-08-24 MED ORDER — LACTATED RINGERS IV SOLN: INTRAVENOUS | Status: DC

## 2020-08-24 MED ORDER — ONDANSETRON HCL 4 MG PO TABS: 4.0000 mg | ORAL_TABLET | Freq: Four times a day (QID) | ORAL | Status: DC | PRN

## 2020-08-24 MED ORDER — VANCOMYCIN HCL 1000 MG IV SOLR
INTRAVENOUS | Status: AC
  Filled 2020-08-24: qty 1000

## 2020-08-24 MED ORDER — MIDAZOLAM HCL 2 MG/2ML IJ SOLN
INTRAMUSCULAR | Status: AC
  Filled 2020-08-24: qty 2

## 2020-08-24 MED ORDER — DEXAMETHASONE SODIUM PHOSPHATE 10 MG/ML IJ SOLN
INTRAMUSCULAR | Status: AC
  Filled 2020-08-24: qty 1

## 2020-08-24 MED ORDER — FENTANYL CITRATE (PF) 100 MCG/2ML IJ SOLN
25.0000 ug | INTRAMUSCULAR | Status: DC | PRN
  Administered 2020-08-24 (×2): 25 ug via INTRAVENOUS

## 2020-08-24 MED ORDER — ROCURONIUM BROMIDE 10 MG/ML (PF) SYRINGE
PREFILLED_SYRINGE | INTRAVENOUS | Status: DC | PRN
  Administered 2020-08-24: 50 mg via INTRAVENOUS

## 2020-08-24 MED ORDER — CHLORHEXIDINE GLUCONATE 0.12 % MT SOLN
15.0000 mL | Freq: Once | OROMUCOSAL | Status: AC
  Administered 2020-08-24: 15 mL via OROMUCOSAL
  Filled 2020-08-24: qty 15

## 2020-08-24 MED ORDER — FENTANYL CITRATE (PF) 250 MCG/5ML IJ SOLN
INTRAMUSCULAR | Status: DC | PRN
  Administered 2020-08-24 (×2): 50 ug via INTRAVENOUS

## 2020-08-24 MED ORDER — SODIUM CHLORIDE 0.9 % IV SOLN: INTRAVENOUS | Status: DC

## 2020-08-24 MED ORDER — CEFAZOLIN SODIUM-DEXTROSE 2-4 GM/100ML-% IV SOLN
2.0000 g | INTRAVENOUS | Status: AC
  Administered 2020-08-25: 2 g via INTRAVENOUS
  Filled 2020-08-24 (×2): qty 100

## 2020-08-24 MED ORDER — DEXAMETHASONE SODIUM PHOSPHATE 10 MG/ML IJ SOLN
INTRAMUSCULAR | Status: DC | PRN
  Administered 2020-08-24: 8 mg via INTRAVENOUS

## 2020-08-24 MED ORDER — MENTHOL 3 MG MT LOZG: 1.0000 | LOZENGE | OROMUCOSAL | Status: DC | PRN

## 2020-08-24 MED ORDER — CINACALCET HCL 30 MG PO TABS
120.0000 mg | ORAL_TABLET | Freq: Every day | ORAL | Status: DC
  Administered 2020-08-24 – 2020-08-25 (×2): 120 mg via ORAL
  Filled 2020-08-24 (×2): qty 4

## 2020-08-24 MED ORDER — CHLORHEXIDINE GLUCONATE CLOTH 2 % EX PADS
6.0000 | MEDICATED_PAD | Freq: Every day | CUTANEOUS | Status: DC
  Administered 2020-08-25: 6 via TOPICAL

## 2020-08-24 MED ORDER — ONDANSETRON HCL 4 MG/2ML IJ SOLN
INTRAMUSCULAR | Status: DC | PRN
  Administered 2020-08-24: 4 mg via INTRAVENOUS

## 2020-08-24 MED ORDER — BUPIVACAINE LIPOSOME 1.3 % IJ SUSP
INTRAMUSCULAR | Status: DC | PRN
  Administered 2020-08-24: 10 mL via PERINEURAL

## 2020-08-24 MED ORDER — ACETAMINOPHEN 500 MG PO TABS
1000.0000 mg | ORAL_TABLET | Freq: Three times a day (TID) | ORAL | Status: DC
  Administered 2020-08-24 – 2020-08-25 (×3): 1000 mg via ORAL
  Filled 2020-08-24 (×3): qty 2

## 2020-08-24 MED ORDER — PROPOFOL 10 MG/ML IV BOLUS
INTRAVENOUS | Status: DC | PRN
  Administered 2020-08-24: 140 mg via INTRAVENOUS

## 2020-08-24 MED ORDER — LANTHANUM CARBONATE 500 MG PO CHEW
1000.0000 mg | CHEWABLE_TABLET | Freq: Three times a day (TID) | ORAL | Status: DC
  Administered 2020-08-24 – 2020-08-25 (×2): 1000 mg via ORAL
  Filled 2020-08-24 (×4): qty 2

## 2020-08-24 MED ORDER — FENTANYL CITRATE (PF) 100 MCG/2ML IJ SOLN
INTRAMUSCULAR | Status: AC
  Filled 2020-08-24: qty 2

## 2020-08-24 MED ORDER — OXYCODONE HCL 5 MG PO TABS: 5.0000 mg | ORAL_TABLET | ORAL | Status: DC | PRN

## 2020-08-24 MED ORDER — PANTOPRAZOLE SODIUM 40 MG PO TBEC
40.0000 mg | DELAYED_RELEASE_TABLET | Freq: Every day | ORAL | Status: DC
  Administered 2020-08-24 – 2020-08-25 (×2): 40 mg via ORAL
  Filled 2020-08-24 (×2): qty 1

## 2020-08-24 MED ORDER — TOPIRAMATE 25 MG PO TABS
25.0000 mg | ORAL_TABLET | Freq: Every day | ORAL | Status: DC
  Administered 2020-08-24 – 2020-08-25 (×2): 25 mg via ORAL
  Filled 2020-08-24 (×2): qty 1

## 2020-08-24 MED ORDER — MIDAZOLAM HCL 2 MG/2ML IJ SOLN
INTRAMUSCULAR | Status: DC | PRN
  Administered 2020-08-24: 2 mg via INTRAVENOUS

## 2020-08-24 MED ORDER — PROPOFOL 10 MG/ML IV BOLUS
INTRAVENOUS | Status: AC
  Filled 2020-08-24: qty 20

## 2020-08-24 MED ORDER — ORAL CARE MOUTH RINSE: 15.0000 mL | Freq: Once | OROMUCOSAL | Status: AC

## 2020-08-24 MED ORDER — SODIUM CHLORIDE 0.9 % IR SOLN
Status: DC | PRN
  Administered 2020-08-24: 1000 mL

## 2020-08-24 MED ORDER — BUPIVACAINE HCL (PF) 0.5 % IJ SOLN
INTRAMUSCULAR | Status: DC | PRN
  Administered 2020-08-24: 10 mL via PERINEURAL

## 2020-08-24 MED ORDER — 0.9 % SODIUM CHLORIDE (POUR BTL) OPTIME
TOPICAL | Status: DC | PRN
  Administered 2020-08-24: 1000 mL

## 2020-08-24 MED ORDER — FENTANYL CITRATE (PF) 250 MCG/5ML IJ SOLN
INTRAMUSCULAR | Status: AC
  Filled 2020-08-24: qty 5

## 2020-08-24 MED ORDER — ONDANSETRON HCL 4 MG/2ML IJ SOLN: 4.0000 mg | Freq: Four times a day (QID) | INTRAMUSCULAR | Status: DC | PRN

## 2020-08-24 MED ORDER — HYDROMORPHONE HCL 1 MG/ML IJ SOLN
0.5000 mg | INTRAMUSCULAR | Status: DC | PRN
  Administered 2020-08-24 – 2020-08-25 (×2): 1 mg via INTRAVENOUS
  Filled 2020-08-24 (×2): qty 1

## 2020-08-24 SURGICAL SUPPLY — 84 items
AID PSTN UNV HD RSTRNT DISP (MISCELLANEOUS) ×1
APL PRP STRL LF DISP 70% ISPRP (MISCELLANEOUS) ×2
BAG COUNTER SPONGE SURGICOUNT (BAG) ×2 IMPLANT
BAG SPNG CNTER NS LX DISP (BAG) ×1
BAG SURGICOUNT SPONGE COUNTING (BAG) ×1
BASEPLATE GLENOID STD REV 42 (Joint) ×2 IMPLANT
BASEPLATE HALF WEDGE 35X29 (Plate) ×2 IMPLANT
BIT DRILL 3.2 PERIPHERAL SCREW (BIT) ×2 IMPLANT
BLADE SAW SAG 73X25 THK (BLADE) ×2
BLADE SAW SGTL 73X25 THK (BLADE) ×1 IMPLANT
BONE SCREW THREAD 6.5X35MM (Screw) ×1 IMPLANT
BSPLAT GLND 35D 29 HLF WDG (Plate) ×1 IMPLANT
CHLORAPREP W/TINT 26 (MISCELLANEOUS) ×6 IMPLANT
CLOSURE STERI-STRIP 1/2X4 (GAUZE/BANDAGES/DRESSINGS) ×1
CLOSURE WOUND 1/2 X4 (GAUZE/BANDAGES/DRESSINGS) ×1
CLSR STERI-STRIP ANTIMIC 1/2X4 (GAUZE/BANDAGES/DRESSINGS) ×1 IMPLANT
COOLER ICEMAN CLASSIC (MISCELLANEOUS) ×2 IMPLANT
COVER SURGICAL LIGHT HANDLE (MISCELLANEOUS) ×3 IMPLANT
DRAPE C-ARM 42X72 X-RAY (DRAPES) IMPLANT
DRAPE HALF SHEET 40X57 (DRAPES) ×3 IMPLANT
DRAPE INCISE IOBAN 66X45 STRL (DRAPES) ×6 IMPLANT
DRAPE ORTHO SPLIT 77X108 STRL (DRAPES) ×6
DRAPE SURG ORHT 6 SPLT 77X108 (DRAPES) ×2 IMPLANT
DRAPE SWITCH (DRAPES) ×3 IMPLANT
DRAPE U-SHAPE 47X51 STRL (DRAPES) IMPLANT
DRESSING AQUACEL AG SP 3.5X10 (GAUZE/BANDAGES/DRESSINGS) IMPLANT
DRSG AQUACEL AG ADV 3.5X 6 (GAUZE/BANDAGES/DRESSINGS) ×3 IMPLANT
DRSG AQUACEL AG SP 3.5X10 (GAUZE/BANDAGES/DRESSINGS) ×3
ELECT BLADE 4.0 EZ CLEAN MEGAD (MISCELLANEOUS) ×3
ELECT REM PT RETURN 9FT ADLT (ELECTROSURGICAL) ×3
ELECTRODE BLDE 4.0 EZ CLN MEGD (MISCELLANEOUS) IMPLANT
ELECTRODE REM PT RTRN 9FT ADLT (ELECTROSURGICAL) ×1 IMPLANT
GLOVE SRG 8 PF TXTR STRL LF DI (GLOVE) ×1 IMPLANT
GLOVE SURG ENC MOIS LTX SZ6.5 (GLOVE) ×3 IMPLANT
GLOVE SURG LTX SZ8 (GLOVE) ×6 IMPLANT
GLOVE SURG UNDER LTX SZ6.5 (GLOVE) ×3 IMPLANT
GLOVE SURG UNDER POLY LF SZ8 (GLOVE) ×3
GOWN STRL REUS W/ TWL LRG LVL3 (GOWN DISPOSABLE) ×1 IMPLANT
GOWN STRL REUS W/ TWL XL LVL3 (GOWN DISPOSABLE) IMPLANT
GOWN STRL REUS W/TWL LRG LVL3 (GOWN DISPOSABLE) ×3
GOWN STRL REUS W/TWL XL LVL3 (GOWN DISPOSABLE)
GUIDEWIRE GLENOID 2.5X220 (WIRE) ×2 IMPLANT
HANDPIECE INTERPULSE COAX TIP (DISPOSABLE) ×3
IMPL REVERSE SHOULDER 0X3.5 (Shoulder) IMPLANT
IMPLANT REVERSE SHOULDER 0X3.5 (Shoulder) ×3 IMPLANT
INSERT REV SHOULDER 42X9 12-5 (Insert) ×2 IMPLANT
KIT BASIN OR (CUSTOM PROCEDURE TRAY) ×3 IMPLANT
KIT SHOULDER STAB MARCO (KITS) ×2 IMPLANT
KIT STABILIZATION SHOULDER (MISCELLANEOUS) ×3 IMPLANT
KIT TURNOVER KIT B (KITS) ×3 IMPLANT
MANIFOLD NEPTUNE II (INSTRUMENTS) ×5 IMPLANT
NDL HYPO 25GX1X1/2 BEV (NEEDLE) IMPLANT
NDL MAYO TROCAR (NEEDLE) IMPLANT
NEEDLE HYPO 25GX1X1/2 BEV (NEEDLE) IMPLANT
NEEDLE MAYO TROCAR (NEEDLE) IMPLANT
NS IRRIG 1000ML POUR BTL (IV SOLUTION) ×3 IMPLANT
PACK SHOULDER (CUSTOM PROCEDURE TRAY) ×3 IMPLANT
PAD ARMBOARD 7.5X6 YLW CONV (MISCELLANEOUS) ×6 IMPLANT
RESTRAINT HEAD UNIVERSAL NS (MISCELLANEOUS) ×3 IMPLANT
SCREW 5.0X18 (Screw) ×4 IMPLANT
SCREW 5.5X22 (Screw) ×2 IMPLANT
SCREW BONE THREAD 6.5X35 (Screw) ×1 IMPLANT
SCREW PERIPHERAL 30 (Screw) ×2 IMPLANT
SET HNDPC FAN SPRY TIP SCT (DISPOSABLE) ×1 IMPLANT
SLING ARM IMMOBILIZER LRG (SOFTGOODS) ×3 IMPLANT
SPONGE T-LAP 18X18 ~~LOC~~+RFID (SPONGE) ×5 IMPLANT
STEM HUMERAL SZ2B STND 70 PTC (Stem) ×3 IMPLANT
STEM HUMERAL SZ2BSTD 70 PTC (Stem) IMPLANT
STRIP CLOSURE SKIN 1/2X4 (GAUZE/BANDAGES/DRESSINGS) ×2 IMPLANT
SUCTION FRAZIER HANDLE 10FR (MISCELLANEOUS)
SUCTION TUBE FRAZIER 10FR DISP (MISCELLANEOUS) IMPLANT
SUT ETHIBOND 2 V 37 (SUTURE) ×3 IMPLANT
SUT ETHIBOND NAB CT1 #1 30IN (SUTURE) ×3 IMPLANT
SUT FIBERWIRE #5 38 CONV NDL (SUTURE) ×12
SUT MNCRL AB 3-0 PS2 18 (SUTURE) ×3 IMPLANT
SUT MNCRL AB 4-0 PS2 18 (SUTURE) ×2 IMPLANT
SUT VIC AB 2-0 CT1 27 (SUTURE) ×3
SUT VIC AB 2-0 CT1 TAPERPNT 27 (SUTURE) ×1 IMPLANT
SUT VIC AB 3-0 SH 27 (SUTURE) ×3
SUT VIC AB 3-0 SH 27X BRD (SUTURE) IMPLANT
SUTURE FIBERWR #5 38 CONV NDL (SUTURE) ×4 IMPLANT
TOWEL GREEN STERILE (TOWEL DISPOSABLE) ×3 IMPLANT
TRAY FOLEY W/BAG SLVR 14FR (SET/KITS/TRAYS/PACK) IMPLANT
WATER STERILE IRR 1000ML POUR (IV SOLUTION) ×3 IMPLANT

## 2020-08-24 NOTE — Interval H&P Note (Signed)
I again spoke to the patient with Dr. Gifford Shave. He consented to using PRBCs, FFP and other products in the setting of life and death care. We will prioritize use of the cell saver if necessary.

## 2020-08-24 NOTE — Anesthesia Procedure Notes (Addendum)
Procedure Name: Intubation Date/Time: 08/24/2020 8:37 AM Performed by: Asher Muir, CRNA Pre-anesthesia Checklist: Patient identified, Emergency Drugs available, Suction available and Patient being monitored Patient Re-evaluated:Patient Re-evaluated prior to induction Oxygen Delivery Method: Circle system utilized and Simple face mask Preoxygenation: Pre-oxygenation with 100% oxygen Induction Type: IV induction Ventilation: Mask ventilation without difficulty Laryngoscope Size: 4 and Glidescope (Lopro 4) Grade View: Grade II Tube type: Oral Tube size: 7.5 mm Number of attempts: 1 Airway Equipment and Method: Stylet and Video-laryngoscopy Placement Confirmation: ETT inserted through vocal cords under direct vision, positive ETCO2 and breath sounds checked- equal and bilateral Secured at: 21 (right lip) cm Tube secured with: Tape Dental Injury: Teeth and Oropharynx as per pre-operative assessment

## 2020-08-24 NOTE — Transfer of Care (Signed)
Immediate Anesthesia Transfer of Care Note  Patient: Carl Found Sr.  Procedure(s) Performed: REVERSE SHOULDER ARTHROPLASTY (Right: Shoulder)  Patient Location: PACU  Anesthesia Type:General  Level of Consciousness: sedated  Airway & Oxygen Therapy: Patient Spontanous Breathing and Patient connected to face mask oxygen  Post-op Assessment: Report given to RN  Post vital signs: Reviewed and stable  Last Vitals:  Vitals Value Taken Time  BP 144/84 08/24/20 1049  Temp    Pulse 72 08/24/20 1050  Resp 16 08/24/20 1050  SpO2 100 % 08/24/20 1050  Vitals shown include unvalidated device data.  Last Pain:  Vitals:   08/24/20 0723  TempSrc:   PainSc: 7       Patients Stated Pain Goal: 2 (12/90/47 5339)  Complications: No notable events documented.

## 2020-08-24 NOTE — Progress Notes (Signed)
PHARMACY NOTE:  ANTIMICROBIAL RENAL DOSAGE ADJUSTMENT  Current antimicrobial regimen includes a mismatch between antimicrobial dosage and estimated renal function.  As per policy approved by the Pharmacy & Therapeutics and Medical Executive Committees, the antimicrobial dosage will be adjusted accordingly.  Current antimicrobial dosage:  cefazolin 2gm IV q8h x 3 doses  Indication: surgical prophylaxis  Renal Function:  Estimated Creatinine Clearance: 7.1 mL/min (A) (by C-G formula based on SCr of 13.3 mg/dL (H)). []      On intermittent HD, scheduled: []      On CRRT    Antimicrobial dosage has been changed to:  cefazolin 2gm IV q24h x 1 dose  Additional comments:   Solomon Skowronek A. Levada Dy, PharmD, BCPS, FNKF Clinical Pharmacist [place] Please utilize Amion for appropriate phone number to reach the unit pharmacist ([place])  08/24/2020 2:49 PM

## 2020-08-24 NOTE — Anesthesia Procedure Notes (Deleted)
Date/Time: 08/24/2020 8:37 AM Performed by: Asher Muir, CRNA

## 2020-08-24 NOTE — Op Note (Signed)
Orthopaedic Surgery Operative Note (CSN: [id number])  [name]  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  Patient situation was complicated by his overall glenoid wear as well as the fact that he was a patient with a AV fistula on the ipsilateral side who refused blood products.  We had vascular surgery on backup and in the end lost very minimal blood and had great hemostasis.  His erosion of his glenoid was quite profound with posterior loss of bone.  We are able to use a half wedge augment and get good fixation.  Unfortunately patient's bone quality is glenoid was quite poor and with his history of dialysis he is at high risk of complication including implant failure, infection, and instability.  The patient cephalic vein was not particularly large compared to normal though he did have significantly more bleeding with his deep bleeders around the bicipital groove as well as the subscapularis.  We had adequate control of all of these.  Post-operative plan: The patient will be NWB in sling.  The patient will be will be discharged from PACU if continues to be stable as was plan prior to surgery.  DVT prophylaxis is not indicated due to the patient's dialysis.  Pain control with PRN pain medication preferring oral medicines.  Follow up plan will be scheduled in approximately 7 days for incision check and XR.  Physical therapy to start after 4 weeks.  Implants: Tornier 29 1/2 wedge baseplate with a 30 center screw, 42 glenosphere standard, 0 high offset tray with a 42+6 poly-  Post-Op Diagnosis: Same Surgeons:Primary: Hiram Gash, MD Assistants:Caroline McBane PA-C Location: [facility] OR ROOM 06 Anesthesia: General with Exparel Interscalene Antibiotics: Ancef 2g preop, Vancomycin 1028m  locally Tourniquet time: None Estimated Blood Loss: 1762Complications: None Specimens: None Implants: Implant Name Type Inv. Item Serial No. Manufacturer Lot No. LRB No. Used Action  BASEPLATE HALF WEDGE [id number] BASEPLATE HALF WEDGE [id number]  Right 1 Implanted  BASEPLATE GLENOID STD REV 42 - [id number] BASEPLATE GLENOID STD REV 42 [id number]  Right 1 Implanted  INSRT REV SHOULDER 42X9MM-12-5 - [id number] INSRT REV SHOULDER 42X9MM-12-5 [id number]  Right 1 Implanted  IMPLANT REVERSE SHOULDER 0X3.5 - [id number] IMPLANT REVERSE SHOULDER 0X3.5 [id number] [employer]  Right 1 Implanted  STEM HUMERAL SZ2B STND 70 PTC - [id number] Stem STEM HUMERAL SZ2B STND 70 PTC [id number] [employer]  Right 1 Implanted  BONE SCREW THREAD 6.5X35MM - [id number] BONE SCREW THREAD 6.5X35MM  [employer]  Right 1 Implanted  SCREW 5.0X18 - [id number] SCREW 5.0X18  [employer]  Right 2 Implanted  SCREW 5.5X22 - [id number] SCREW 5.5X22  [employer]  Right 1 Implanted  SCREW PERIPHERAL 30 - [id number] SCREW PERIPHERAL 30  [employer]  Right 1 Implanted    Indications for Surgery:   TTavyn KurkaSr. is a 60y.o. male with history of dialysis and irreparable cuff tear with early cuff tear arthropathy.  The patient's AV fistula on the ipsilateral arm as well as his refusal of blood products complicated the matter.  Benefits and risks of operative and nonoperative management were discussed prior to surgery with patient/guardian(s) and informed consent form was completed.  Infection and need for further surgery were discussed as was prosthetic stability  and cuff issues.  We additionally specifically discussed risks of axillary nerve injury, infection, periprosthetic fracture, continued pain and longevity of implants prior to beginning procedure.      Procedure:   The patient was identified in the preoperative holding  area where the surgical site was marked. Block placed by anesthesia with exparel.  The patient was taken to the OR where a procedural timeout was called and the above noted anesthesia was induced.  The patient was positioned beachchair on allen table with spider arm positioner.  Preoperative antibiotics were dosed.  The patient's right shoulder was prepped and draped in the usual sterile fashion.  A second preoperative timeout was called.       Standard deltopectoral approach was performed with a #10 blade. We dissected down to the subcutaneous tissues and the cephalic vein was taken laterally with the deltoid.  We were careful to avoid excess tension on the cephalic vein throughout the case and placed a wet lap as well as blunt retractors over it in the setting of an AV fistula though the vein itself was not significantly larger or altered compared to a normal.  Clavipectoral fascia was incised in line with the incision. Deep retractors were placed. The long of the biceps tendon was identified and there was significant tenosynovitis present.  Tenodesis was performed to the pectoralis tendon with #2 Ethibond. The remaining biceps was followed up into the rotator interval where it was released.   The subscapularis was taken down in a full thickness layer with capsule along the humeral neck extending inferiorly around the humeral head. We continued releasing the capsule directly off of the osteophytes inferiorly all the way around the corner. This allowed [place] to dislocate the humeral head.   The humeral head had evidence of severe osteoarthritic wear with full-thickness cartilage loss and exposed subchondral bone. There was significant flattening of the humeral head.   The rotator cuff was carefully examined and noted to be irreperably torn.  The decision was confirmed that a reverse total shoulder was indicated for this patient.  There were osteophytes along the inferior humeral neck. The osteophytes were  removed with an osteotome and a rongeur.  Osteophytes were removed with a rongeur and an osteotome and the anatomic neck was well visualized.     A humeral cutting guide was inserted down the intramedullary canal. The version was set at 20 of retroversion. Humeral osteotomy was performed with an oscillating saw. The head fragment was passed off the back table. A starter awl was used to open the humeral canal. We next used T-handle straight sound reamers to ream up to an appropriate fit. A chisel was used to remove proximal humeral bone. We then broached starting with a size one broach and broaching up to 2 which obtained an appropriate fit. The broach handle was removed. A cut protector was placed. The broach handle was removed and a cut protector was placed. The humerus was retracted posteriorly and we turned our attention to glenoid exposure.  The subscapularis was again identified and immediately we took care to palpate the axillary nerve anteriorly and verify its position with gentle palpation as well as the tug test.  We then released the SGHL with bovie cautery prior to placing a curved mayo at the junction of the anterior glenoid well above the axillary nerve and bluntly dissecting the subscapularis from the capsule.  We then carefully protected the axillary nerve as we gently released the inferior capsule to fully mobilize the  subscapularis.  An anterior deltoid retractor was then placed as well as a small Hohmann retractor superiorly.   The glenoid was inspected and had evidence of severe osteoarthritic wear with full-thickness cartilage loss and exposed subchondral bone.   The remaining labrum was removed circumferentially taking great care not to disrupt the posterior capsule.   Based on blueprint planning the patient had significant posterior erosion of his glenoid and we felt that a half wedge augment was appropriate size.  We were able to place a guidewire using our preoperative planning as  well as our templating guide and obtain good purchase within the vault.  That point used a peripheral native reamer to ream the anterior portion of the glenoid taking significant anterior inferior bone to correct the patient's version and tilt.  We then used a offset reamer to ream the medial glenoid posteriorly.  We had good circumferential witness marks and we placed our trial which obtained good fit.  We drilled the center post and measured.  We selected a 6.5 x 30 mm screw.  This was placed on a half wedge augment baseplate and implanted after irrigating and debriding any peripheral tissue.  4 peripheral screws were placed.  A 42 standard glenosphere was placed without issue in the center screw tightened.   We turned attention back to the humeral side. The cut protector was removed. We trialed with multiple size tray and polyethylene options and selected a 6 which provided good stability and range of motion without excess soft tissue tension. The offset was dialed in to match the normal anatomy. The shoulder was trialed.  There was good ROM in all planes and the shoulder was stable with no inferior translation.  The real humeral implants were opened after again confirming sizes.  The trial was removed. #5 Fiberwire x4 sutures passed through the humeral neck for subscap repair. The humeral component was press-fit obtaining a secure fit. A +0 high offset tray was selected and impacted onto the stem.  A 42+6 polyethylene liner was impacted onto the stem.  The joint was reduced and thoroughly irrigated with pulsatile lavage. Subscap was repaired back with #5 Fiberwire sutures through bone tunnels. Hemostasis was obtained. The deltopectoral interval was reapproximated with #1 Ethibond. The subcutaneous tissues were closed with 2-0 Vicryl and the skin was closed with running monocryl.    The wounds were cleaned and dried and an Aquacel dressing was placed. The drapes taken down. The arm was placed into sling  with abduction pillow. Patient was awakened, extubated, and transferred to the recovery room in stable condition. There were no intraoperative complications. The sponge, needle, and attention counts were  correct at the end of the case.       Noemi Chapel, PA-C, present and scrubbed throughout the case, critical for completion in a timely fashion, and for retraction, instrumentation, closure.

## 2020-08-24 NOTE — Progress Notes (Signed)
Patient was supposed to have dialysis yesterday before his scheduled procedure. He was scheduled at the [facility] center, but apparently did not show. Mr. Deboy told his PACU nurse that he was supposed to get dialysis today. We were not aware that the patient did not show to his dialysis appointment yesterday. I spoke with Dr. Carolin Sicks, Nephrology, who said he will update the inpatient team about Mr. Bozzo and determine if he requires dialysis today.   Noemi Chapel, PA-C 08/24/2020

## 2020-08-24 NOTE — Anesthesia Procedure Notes (Signed)
Anesthesia Regional Block: Interscalene brachial plexus block   Pre-Anesthetic Checklist: , timeout performed,  Correct Patient, Correct Site, Correct Laterality,  Correct Procedure, Correct Position, site marked,  Risks and benefits discussed,  Surgical consent,  Pre-op evaluation,  At surgeon's request and post-op pain management  Laterality: Right  Prep: chloraprep       Needles:  Injection technique: Single-shot  Needle Type: Echogenic Stimulator Needle     Needle Length: 5cm  Needle Gauge: 22     Additional Needles:   Procedures:,,,, ultrasound used (permanent image in chart),,    Narrative:  Start time: 08/24/2020 8:02 AM End time: 08/24/2020 8:12 AM Injection made incrementally with aspirations every 5 mL.  Performed by: Personally  Anesthesiologist: Catalina Gravel, MD  Additional Notes: Functioning IV was confirmed and monitors were applied.  A 30mm 22ga Arrow echogenic stimulator needle was used. Sterile prep and drape, hand hygiene, and sterile gloves were used.  Negative aspiration and negative test dose prior to incremental administration of local anesthetic. The patient tolerated the procedure well.  Ultrasound guidance: relevent anatomy identified, needle position confirmed, local anesthetic spread visualized around nerve(s), vascular puncture avoided.  Image printed for medical record.

## 2020-08-24 NOTE — Progress Notes (Addendum)
Brief Nephrology Observation note: The ortho team called [place] to arrange dialysis. Pt is s/p elective right shoulder arthroplasty.  He is well known to me from OP HD unit. The last dialysis was on 7/11 Monday for 2 hr 46 mins.   OP HD orders: [facility], MWF, EDW 94.5 kg, Right UE AVF.  Chart and lab result reviewed. We'll plan HD today to resume OP MWF schedule. Discussed with nurse.  Full consult to follow if patient is admitted inpatient.

## 2020-08-24 NOTE — Interval H&P Note (Signed)
Updated previous note:  will consent to PRBC, FFP, fractionated blood products, plan for cell saver ideally first if needed.  Vascular surgery aware of case as well.  History and Physical Interval Note:  08/24/2020 8:14 AM  Carl Found Sr.  has presented today for surgery, with the diagnosis of RIGHT SHOULDER DJD.  The various methods of treatment have been discussed with the patient and family. After consideration of risks, benefits and other options for treatment, the patient has consented to  Procedure(s): REVERSE SHOULDER ARTHROPLASTY (Right) as a surgical intervention.  The patient's history has been reviewed, patient examined, no change in status, stable for surgery.  I have reviewed the patient's chart and labs.  Questions were answered to the patient's satisfaction.     [name]

## 2020-08-24 NOTE — Progress Notes (Signed)
Orthopedic Tech Progress Note Patient Details:  Carl Nimmons Sr. 03/02/1960 [id number]  OR RN called requesting an ABDUCTION PILLOW.Marland Kitchen dropped off to OR DESK Ortho Devices Type of Ortho Device: Shoulder abduction pillow Ortho Device/Splint Interventions: Other (comment)   Post Interventions Patient Tolerated: Well Instructions Provided: Care of device  Janit Pagan 08/24/2020, 10:27 AM

## 2020-08-25 ENCOUNTER — Encounter (HOSPITAL_COMMUNITY): Payer: Self-pay | Admitting: Orthopaedic Surgery

## 2020-08-25 DIAGNOSIS — E1122 Type 2 diabetes mellitus with diabetic chronic kidney disease: Secondary | ICD-10-CM | POA: Diagnosis not present

## 2020-08-25 DIAGNOSIS — Z87891 Personal history of nicotine dependence: Secondary | ICD-10-CM | POA: Diagnosis not present

## 2020-08-25 DIAGNOSIS — Z992 Dependence on renal dialysis: Secondary | ICD-10-CM | POA: Diagnosis not present

## 2020-08-25 DIAGNOSIS — N186 End stage renal disease: Secondary | ICD-10-CM | POA: Diagnosis not present

## 2020-08-25 DIAGNOSIS — Z85528 Personal history of other malignant neoplasm of kidney: Secondary | ICD-10-CM | POA: Diagnosis not present

## 2020-08-25 DIAGNOSIS — I12 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 stage renal disease: Secondary | ICD-10-CM | POA: Diagnosis not present

## 2020-08-25 DIAGNOSIS — M75101 Unspecified rotator cuff tear or rupture of right shoulder, not specified as traumatic: Secondary | ICD-10-CM | POA: Diagnosis not present

## 2020-08-25 DIAGNOSIS — Z79899 Other long term (current) drug therapy: Secondary | ICD-10-CM | POA: Diagnosis not present

## 2020-08-25 DIAGNOSIS — Z7982 Long term (current) use of aspirin: Secondary | ICD-10-CM | POA: Diagnosis not present

## 2020-08-25 DIAGNOSIS — Z8673 Personal history of transient ischemic attack (TIA), and cerebral infarction without residual deficits: Secondary | ICD-10-CM | POA: Diagnosis not present

## 2020-08-25 LAB — BASIC METABOLIC PANEL
Anion gap: 19 — ABNORMAL HIGH (ref 5–15)
BUN: 82 mg/dL — ABNORMAL HIGH (ref 8–23)
CO2: 20 mmol/L — ABNORMAL LOW (ref 22–32)
Calcium: 8.9 mg/dL (ref 8.9–10.3)
Chloride: 95 mmol/L — ABNORMAL LOW (ref 98–111)
Creatinine, Ser: 13.71 mg/dL — ABNORMAL HIGH (ref 0.61–1.24)
GFR, Estimated: 4 mL/min — ABNORMAL LOW (ref >60)
Glucose, Bld: 112 mg/dL — ABNORMAL HIGH (ref 70–99)
Potassium: 5.4 mmol/L — ABNORMAL HIGH (ref 3.5–5.1)
Sodium: 134 mmol/L — ABNORMAL LOW (ref 135–145)

## 2020-08-25 LAB — CBC
HCT: 31.4 % — ABNORMAL LOW (ref 39.0–52.0)
Hemoglobin: 10.2 g/dL — ABNORMAL LOW (ref 13.0–17.0)
MCH: 27.7 pg (ref 26.0–34.0)
MCHC: 32.5 g/dL (ref 30.0–36.0)
MCV: 85.3 fL (ref 80.0–100.0)
Platelets: 254 10*3/uL (ref 150–400)
RBC: 3.68 MIL/uL — ABNORMAL LOW (ref 4.22–5.81)
RDW: 17.7 % — ABNORMAL HIGH (ref 11.5–15.5)
WBC: 6.4 10*3/uL (ref 4.0–10.5)
nRBC: 0 % (ref 0.0–0.2)

## 2020-08-25 MED ORDER — GABAPENTIN 100 MG PO CAPS: 100.0000 mg | ORAL_CAPSULE | Freq: Three times a day (TID) | ORAL | 0 refills | Status: DC

## 2020-08-25 MED ORDER — ACETAMINOPHEN 500 MG PO TABS: 1000.0000 mg | ORAL_TABLET | Freq: Three times a day (TID) | ORAL | 0 refills | Status: AC

## 2020-08-25 MED ORDER — OXYCODONE HCL 5 MG PO TABS: ORAL_TABLET | ORAL | 0 refills | Status: AC

## 2020-08-25 MED ORDER — ALTEPLASE 2 MG IJ SOLR: 2.0000 mg | Freq: Once | INTRAMUSCULAR | Status: DC | PRN

## 2020-08-25 MED ORDER — OXYCODONE HCL 5 MG PO TABS: 10.0000 mg | ORAL_TABLET | ORAL | Status: DC | PRN

## 2020-08-25 MED ORDER — ONDANSETRON HCL 4 MG PO TABS: 4.0000 mg | ORAL_TABLET | Freq: Three times a day (TID) | ORAL | 0 refills | Status: AC | PRN

## 2020-08-25 MED ORDER — HEPARIN SODIUM (PORCINE) 1000 UNIT/ML DIALYSIS: 1000.0000 [IU] | INTRAMUSCULAR | Status: DC | PRN

## 2020-08-25 MED ORDER — SODIUM CHLORIDE 0.9 % IV SOLN: 100.0000 mL | INTRAVENOUS | Status: DC | PRN

## 2020-08-25 MED ORDER — HYDROMORPHONE HCL 1 MG/ML IJ SOLN
0.5000 mg | INTRAMUSCULAR | Status: DC | PRN
Start: 2020-08-25 — End: 2020-08-25
  Administered 2020-08-25: 0.5 mg via INTRAVENOUS
  Filled 2020-08-25: qty 0.5

## 2020-08-25 MED ORDER — LIDOCAINE-PRILOCAINE 2.5-2.5 % EX CREA: 1.0000 "application " | TOPICAL_CREAM | CUTANEOUS | Status: DC | PRN

## 2020-08-25 MED ORDER — PENTAFLUOROPROP-TETRAFLUOROETH EX AERO: 1.0000 "application " | INHALATION_SPRAY | CUTANEOUS | Status: DC | PRN

## 2020-08-25 MED ORDER — SODIUM ZIRCONIUM CYCLOSILICATE 10 G PO PACK
10.0000 g | PACK | Freq: Every day | ORAL | Status: DC
  Administered 2020-08-25: 10 g via ORAL
  Filled 2020-08-25: qty 1

## 2020-08-25 MED ORDER — OXYCODONE HCL 5 MG PO TABS: 5.0000 mg | ORAL_TABLET | ORAL | Status: DC | PRN

## 2020-08-25 MED ORDER — HYDROMORPHONE HCL 1 MG/ML IJ SOLN
0.2500 mg | Freq: Four times a day (QID) | INTRAMUSCULAR | Status: DC | PRN
Start: 2020-08-25 — End: 2020-08-25

## 2020-08-25 MED ORDER — LIDOCAINE HCL (PF) 1 % IJ SOLN: 5.0000 mL | INTRAMUSCULAR | Status: DC | PRN

## 2020-08-25 NOTE — Evaluation (Addendum)
Occupational Therapy Evaluation Patient Details Name: Carl Bahl Sr. MRN: [id number] DOB: [birth year] Today's Date: 08/25/2020    History of Present Illness Patient is a 60 year-old on hemodialysis with multiple medical comorbidities, including right TIA, GI bleed, ESRD, gout, diabetes and hypertension.  He has had a significant amount of right shoulder pain for many months.  He has not made much improvement with non-operative management including injections and home exercise program. He continues to be quite debilitated by his shoulder. Pt s/p Reverse R TSA   Clinical Impression   Pt presents with decline in function and safety with ADLs s/p reverse R TSA. PTA pt lived at home with his brother and was Ind with ADLs/selfcare, IADLs, home mgt and mobility. Pt currently requires min A with UB/LB bathing and dressing, set up with grooming and self feeding tasks, Ind with mobility/ambulation. Pt will have assist at home from family as needed. Pt instructed on shoulder education sling wear/positioning, R elbow/wrist/hand ROM allowed by MD with handouts provided. All education completed and no further acute OT services are indicated at this time    Follow Up Recommendations  Follow surgeon's recommendation for DC plan and follow-up therapies(progress rehab of shoulder per MD)   Equipment Recommendations  Tub/shower seat    Recommendations for Other Services       Precautions / Restrictions Precautions Precautions: Shoulder Type of Shoulder Precautions: NO A/PRO R shoulder Shoulder Interventions: Shoulder sling/immobilizer;Shoulder abduction pillow;At all times;Off for dressing/bathing/exercises Precaution Booklet Issued: Yes (comment) Required Braces or Orthoses: Other Brace Other Brace: R shoulder ABD sling Restrictions Weight Bearing Restrictions: Yes RUE Weight Bearing: Non weight bearing      Mobility Bed Mobility Overal bed mobility: Modified Independent              General bed mobility comments: increased time and effort, cues for not using R UE    Transfers Overall transfer level: Needs assistance Equipment used: None Transfers: Sit to/from Stand           General transfer comment: pushed IV pole durnig ambulation to bathroom and room door and to recliner    Balance Overall balance assessment: Modified Independent                                         ADL either performed or assessed with clinical judgement   ADL Overall ADL's : Needs assistance/impaired Eating/Feeding: Set up;Independent   Grooming: Wash/dry hands;Wash/dry face;Independent   Upper Body Bathing: Minimal assistance   Lower Body Bathing: Minimal assistance;Sit to/from stand   Upper Body Dressing : Minimal assistance   Lower Body Dressing: Minimal assistance;Sit to/from stand   Toilet Transfer: Modified Independent;Ambulation;Comfort height toilet   Toileting- Clothing Manipulation and Hygiene: Minimal assistance       Functional mobility during ADLs: Modified independent General ADL Comments: Pt educated on compensatory ADL techniques with handouts provided. Assisted pt with LB dressing, donning sling. Pt still wth IV in L UE so unable to dress UB with his own clothes     Vision Baseline Vision/History: Wears glasses Wears Glasses: Reading only Patient Visual Report: No change from baseline       Perception     Praxis      Pertinent Vitals/Pain Pain Assessment: 0-10 Pain Score: 8  Pain Descriptors / Indicators: Sore;Aching;Operative site guarding;Grimacing;Guarding Pain Intervention(s): Premedicated before session;Monitored during session;Repositioned     Hand Dominance Right  Extremity/Trunk Assessment Upper Extremity Assessment Upper Extremity Assessment: Generalized weakness;RUE deficits/detail RUE: Unable to fully assess due to immobilization       Cervical / Trunk Assessment Cervical / Trunk Assessment: Normal    Communication Communication Communication: No difficulties   Cognition Arousal/Alertness: Awake/alert Behavior During Therapy: WFL for tasks assessed/performed Overall Cognitive Status: Within Functional Limits for tasks assessed                                     General Comments       Exercises Other Exercises Other Exercises: Pt instructed on R elbow, wrist and ROM as allowed per MD with handout provided. Pt able to return demo   Shoulder Instructions Shoulder Instructions Donning/doffing shirt without moving shoulder: Minimal assistance Method for sponge bathing under operated UE: Minimal assistance Donning/doffing sling/immobilizer: Minimal assistance Correct positioning of sling/immobilizer: Supervision/safety ROM for elbow, wrist and digits of operated UE: Independent Sling wearing schedule (on at all times/off for ADL's): Independent Proper positioning of operated UE when showering: Independent Positioning of UE while sleeping: [name] expects to be discharged to:: Private residence Living Arrangements: Other relatives (brother) Available Help at Discharge: Family;Friend(s) Type of Home: House Home Access: Stairs to enter [employer] of Steps: 1   Home Layout: Two level;Able to live on main level with bedroom/bathroom     Bathroom Shower/Tub: Occupational psychologist: Handicapped height     Home Equipment: None          Prior Functioning/Environment Level of Independence: Independent                 OT Problem List: Decreased range of motion;Decreased coordination;Impaired UE functional use;Pain      OT Treatment/Interventions:      OT Goals(Current goals can be found in the care plan section) Acute Rehab OT Goals Patient Stated Goal: go home  OT Frequency:     Barriers to D/C:            Co-evaluation              AM-PAC OT "6 Clicks" Daily Activity      Outcome Measure Help from another person eating meals?: None (set up) Help from another person taking care of personal grooming?: None (set up) Help from another person toileting, which includes using toliet, bedpan, or urinal?: A Little Help from another person bathing (including washing, rinsing, drying)?: A Little Help from another person to put on and taking off regular upper body clothing?: A Little Help from another person to put on and taking off regular lower body clothing?: A Little 6 Click Score: 20   End of Session Equipment Utilized During Treatment: Other (comment) (R UE shoulder ABD sling)  Activity Tolerance: Patient tolerated treatment well Patient left: in chair;with call bell/phone within reach  OT Visit Diagnosis: Pain;Muscle weakness (generalized) (M62.81) Pain - Right/Left: Right Pain - part of body: Shoulder;Arm                Time: 8127-5170 OT Time Calculation (min): 38 min Charges:  OT General Charges $OT Visit: 1 Visit OT Evaluation $OT Eval Low Complexity: 1 Low OT Treatments $Self Care/Home Management : 8-22 mins $Therapeutic Activity: 8-22 mins   Britt Bottom 08/25/2020, 2:57 PM

## 2020-08-25 NOTE — Progress Notes (Signed)
OT Cancellation Note  Patient Details Name: Carl Marriott Sr. MRN: [id number] DOB: [birth year]   Cancelled Treatment:    Reason Eval/Treat Not Completed: Patient at procedure or test/ unavailable. Pt at HD, OT will follow up later today  Britt Bottom 08/25/2020, 11:24 AM

## 2020-08-25 NOTE — Progress Notes (Addendum)
   ORTHOPAEDIC PROGRESS NOTE  s/p Procedure(s): REVERSE SHOULDER ARTHROPLASTY  SUBJECTIVE: Reports mild to moderate pain about operative site. Is frustrated he was not able to get dialysis yesterday. No chest pain. No SOB. No nausea/vomiting. No other complaints.  OBJECTIVE: PE: General: resting in hospital bed, NAD Cardiac: regular rate Pulmonary: no increased work of breathing RUE: Dressing CDI and sling well fitting,  full and painless ROM throughout hand with DPC of 0.  Axillary nerve sensation/motor altered in setting of block and unable to be fully tested.  Distal motor and sensory altered in setting of block.   Vitals:   08/25/20 0143 08/25/20 0517  BP: 113/84 111/79  Pulse: 69 77  Resp: 16 17  Temp: 98.3 F (36.8 C) 98.5 F (36.9 C)  SpO2: 100% 100%     ASSESSMENT: Carl Nabers Sr. is a 60 y.o. male doing well postoperatively. POD#1  PLAN: Weightbearing: NWB RUE Insicional and dressing care: Reinforce dressings as needed Orthopedic device(s):  Sling Showering: Post-op day #2 VTE prophylaxis: Ambulation. SCDs Pain control: PRN pain medications, preferring oral medications Hyperkalemia: Potassium elevated this morning. Ordered patient's home dose Lokelma.  ESRD: We have spoken with Dr. Carolin Sicks about the patient. Nephrology to arrange his dialysis.  Follow - up plan: 1 week in office Dispo: Did not get dialysis yesterday, is hopefully getting dialysis today while in hospital. Can discharge home after dialysis with follow-up scheduled for next week.   Contact information:  Dr. Ophelia Charter, Carl Chapel PA-C , After hours and holidays please check [email or web address] for group call information for [facility]  Carl Chapel, PA-C 08/25/2020

## 2020-08-25 NOTE — Discharge Instructions (Addendum)
Ophelia Charter MD, MPH Noemi Chapel, PA-C [facility] [address] [phone] (tel)   [phone] (fax)   [place] may leave the operative dressing in place until your follow-up appointment. KEEP THE INCISIONS CLEAN AND DRY. There may be a small amount of fluid/bleeding leaking at the surgical site. This is normal after surgery.  If it fills with liquid or blood please call us immediately to change it for you. Use the provided ice machine or Ice packs as often as possible for the first 3-4 days, then as needed for pain relief.  Keep a layer of cloth or a shirt between your skin and the cooling unit to prevent frost bite as it can get very cold.  SHOWERING: - You may shower on Post-Op Day #2.  - The dressing is water resistant but do not scrub it as it may start to peel up.   - You may remove the sling for showering, but keep a water resistant pillow under the arm to keep both the  elbow and shoulder away from the body (mimicking the abduction sling).  - Gently pat the area dry.  - Do not soak the shoulder in water. Do not go swimming in the pool or ocean until your incision is completely healed (about 4-6 weeks after surgery) - KEEP THE INCISIONS CLEAN AND DRY.  EXERCISES Wear the sling at all times You may remove the sling for showering, but keep the arm across the chest or in a secondary sling.    Accidental/Purposeful External Rotation and shoulder flexion (reaching behind you) is to be avoided at all costs for the first month. It is ok to come out of your sling if your are sitting and have assistance for eating.   Do not lift anything heavier than 1 pound until we discuss it further in clinic.  REGIONAL ANESTHESIA (NERVE BLOCKS) The anesthesia team may have performed a nerve block for you if safe in the setting of your care.  This is a great tool used to minimize pain.   Typically the block may start wearing off overnight but the long acting medicine may last for 3-4 days.  The nerve block wearing off can be a challenging period but please utilize your as needed pain medications to try and manage this period.    POST-OP MEDICATIONS- Multimodal approach to pain control In general your pain will be controlled with a combination of substances.  Prescriptions unless otherwise discussed are electronically sent to your pharmacy.  This is a carefully made plan we use to minimize narcotic use.      Acetaminophen - Non-narcotic pain medicine taken on a scheduled basis  Oxycodone - This is a strong narcotic, to be used only on an "as needed" basis for SEVERE pain. Zofran -  take as needed for nausea   FOLLOW-UP If you develop a Fever (>101.5), Redness or Drainage from the surgical incision site, please call our office to arrange for an evaluation. Please call the office to schedule a follow-up appointment for a wound check, 7-10 days post-operatively.  IF YOU HAVE ANY QUESTIONS, PLEASE FEEL FREE TO CALL OUR OFFICE.  HELPFUL INFORMATION  If you had a block, it will wear off between 8-24 hrs postop typically.  This is period when your pain may go from nearly zero to the pain you would have had post-op without the block.  This is an abrupt transition but  nothing dangerous is happening.  You may take an extra dose of narcotic when this happens.  Your arm will be in a sling following surgery. You will be in this sling for the next 4 weeks.  I will let you know the exact duration at your follow-up visit.  You may be more comfortable sleeping in a semi-seated position the first few nights following surgery.  Keep a pillow propped under the elbow and forearm for comfort.  If you have a recliner type of chair it might be beneficial.  If not that is fine too, but it would be helpful to sleep propped up with pillows behind your operated shoulder as well under your elbow and  forearm.  This will reduce pulling on the suture lines.  When dressing, put your operative arm in the sleeve first.  When getting undressed, take your operative arm out last.  Loose fitting, button-down shirts are recommended.  In most states it is against the law to drive while your arm is in a sling. And certainly against the law to drive while taking narcotics.  You may return to work/school in the next couple of days when you feel up to it. Desk work and typing in the sling is fine.  We suggest you use the pain medication the first night prior to going to bed, in order to ease any pain when the anesthesia wears off. You should avoid taking pain medications on an empty stomach as it will make you nauseous.  Do not drink alcoholic beverages or take illicit drugs when taking pain medications.  Pain medication may make you constipated.  Below are a few solutions to try in this order: Decrease the amount of pain medication if you aren't having pain. Drink lots of decaffeinated fluids. Drink prune juice and/or each dried prunes  If the first 3 don't work start with additional solutions Take Colace - an over-the-counter stool softener Take Senokot - an over-the-counter laxative Take Miralax - a stronger over-the-counter laxative   Dental Antibiotics:  In most cases prophylactic antibiotics for Dental procdeures after total joint surgery are not necessary.  Exceptions are as follows:  1. History of prior total joint infection  2. Severely immunocompromised (Organ Transplant, cancer chemotherapy, Rheumatoid biologic meds such as [place])  3. Poorly controlled diabetes (A1C &gt; 8.0, blood glucose over 200)  If you have one of these conditions, contact your surgeon for an antibiotic prescription, prior to your dental procedure.   For more information including helpful videos and documents visit our website:   [email or web address]

## 2020-08-25 NOTE — Progress Notes (Signed)
Pt discharged to home. DC instructions given. No concerns voiced. Pt reminded to stop by pharmacy and pick up meds that were e-prescribed by MD. Voiced understanding. Left unit in wheelchair pushed by nurse tech. Left in stable condition.

## 2020-08-25 NOTE — Procedures (Signed)
   I was present at this dialysis session, have reviewed the session itself and made  appropriate changes Kelly Splinter MD [place] pager [phone]   08/25/2020, 11:01 AM

## 2020-08-25 NOTE — Progress Notes (Signed)
[facility]RI[facility] PROGRESS NOTE  Carl Kenna Sr. May 13, 1960 [id number]   60 year old male seen on HD off schedule today following elective R shoulder arthroplasty on 08/25/20.  Tolerating dialysis well so far.  Denies CP, SOB, n/v/d, abdominal pain, weakness, dizziness and fatigue.  Pain in shoulder mostly well controlled.  Per notes plan to d/c post dialysis today.  If requires inpatient status will complete full consult.   OP HD orders: [facility] MWF 4hrs 1zk 2.5Ca 400/500 94.5kg Hep 2000 Calcitriol 1.5mcg qHD  Jen Mow, PA-C [facility] Pager: [phone]

## 2020-08-25 NOTE — Anesthesia Postprocedure Evaluation (Signed)
Anesthesia Post Note  Patient: Carl Binion Sr.  Procedure(s) Performed: REVERSE SHOULDER ARTHROPLASTY (Right: Shoulder)     Patient location during evaluation: PACU Anesthesia Type: General Level of consciousness: awake and alert Pain management: pain level controlled Vital Signs Assessment: post-procedure vital signs reviewed and stable Respiratory status: spontaneous breathing, nonlabored ventilation, respiratory function stable and patient connected to nasal cannula oxygen Cardiovascular status: blood pressure returned to baseline and stable Postop Assessment: no apparent nausea or vomiting Anesthetic complications: no   No notable events documented.  Last Vitals:  Vitals:   08/25/20 1154 08/25/20 1208  BP: 106/63 110/61  Pulse: 91 89  Resp:  20  Temp:  36.9 C  SpO2:  97%    Last Pain:  Vitals:   08/25/20 1208  TempSrc: Oral  PainSc:                  Catalina Gravel

## 2020-08-26 ENCOUNTER — Telehealth: Payer: Self-pay | Admitting: Nephrology

## 2020-08-26 DIAGNOSIS — D689 Coagulation defect, unspecified: Secondary | ICD-10-CM | POA: Diagnosis not present

## 2020-08-26 DIAGNOSIS — E875 Hyperkalemia: Secondary | ICD-10-CM | POA: Diagnosis not present

## 2020-08-26 DIAGNOSIS — R519 Headache, unspecified: Secondary | ICD-10-CM | POA: Diagnosis not present

## 2020-08-26 DIAGNOSIS — D509 Iron deficiency anemia, unspecified: Secondary | ICD-10-CM | POA: Diagnosis not present

## 2020-08-26 DIAGNOSIS — M1712 Unilateral primary osteoarthritis, left knee: Secondary | ICD-10-CM | POA: Diagnosis not present

## 2020-08-26 DIAGNOSIS — D631 Anemia in chronic kidney disease: Secondary | ICD-10-CM | POA: Diagnosis not present

## 2020-08-26 DIAGNOSIS — N186 End stage renal disease: Secondary | ICD-10-CM | POA: Diagnosis not present

## 2020-08-26 DIAGNOSIS — Z992 Dependence on renal dialysis: Secondary | ICD-10-CM | POA: Diagnosis not present

## 2020-08-26 DIAGNOSIS — N2581 Secondary hyperparathyroidism of renal origin: Secondary | ICD-10-CM | POA: Diagnosis not present

## 2020-08-26 NOTE — Telephone Encounter (Signed)
Transition of care contact from inpatient facility  Date of discharge: 08/25/20 Date of contact: 08/26/20 Method: Phone Spoke to: Patient  Patient contacted to discuss transition of care from recent inpatient hospitalization. Patient was admitted to [facility] from 7/.12 to 08/25/20.Marland Kitchen with discharge diagnosis of .Right Shoulder cuff tear  and cuff tear Arthropathy with Orthopedics  Procedure  Right Reverse Total Arthroplasty.  Medication changes were reviewed.  Patient will follow up with his/her outpatient HD unit on: Sat 08/27/20

## 2020-08-29 DIAGNOSIS — D509 Iron deficiency anemia, unspecified: Secondary | ICD-10-CM | POA: Diagnosis not present

## 2020-08-29 DIAGNOSIS — D631 Anemia in chronic kidney disease: Secondary | ICD-10-CM | POA: Diagnosis not present

## 2020-08-29 DIAGNOSIS — M12511 Traumatic arthropathy, right shoulder: Secondary | ICD-10-CM | POA: Diagnosis not present

## 2020-08-29 DIAGNOSIS — E875 Hyperkalemia: Secondary | ICD-10-CM | POA: Diagnosis not present

## 2020-08-29 DIAGNOSIS — N186 End stage renal disease: Secondary | ICD-10-CM | POA: Diagnosis not present

## 2020-08-29 DIAGNOSIS — I12 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 stage renal disease: Secondary | ICD-10-CM | POA: Diagnosis not present

## 2020-08-29 DIAGNOSIS — D689 Coagulation defect, unspecified: Secondary | ICD-10-CM | POA: Diagnosis not present

## 2020-08-29 DIAGNOSIS — R519 Headache, unspecified: Secondary | ICD-10-CM | POA: Diagnosis not present

## 2020-08-29 DIAGNOSIS — N2581 Secondary hyperparathyroidism of renal origin: Secondary | ICD-10-CM | POA: Diagnosis not present

## 2020-08-29 DIAGNOSIS — Z992 Dependence on renal dialysis: Secondary | ICD-10-CM | POA: Diagnosis not present

## 2020-08-30 NOTE — Discharge Summary (Signed)
Patient ID: Carl Found Sr. MRN: [id number] DOB/AGE: [birth year] 60 y.o.  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  Discharge Diagnoses:  Active Problems:   Status post reverse total arthroplasty of right shoulder   Past Medical History:  Diagnosis Date   Anemia    hx of   Anxiety    situational    Arthritis    on meds   Borderline diabetes    Cataract    bilateral sx   Chronic headaches    Depression    situational    Diverticulitis    ESRD (end stage renal disease) ([place])     On Renal Transplant List," [facility]; MWF" (10/23/2016)   GERD (gastroesophageal reflux disease)    with certain foods   GI bleed    Hypertension    diet controlled   Parathyroid abnormality ([place])    ectopic parathyroid gland   Presence of arteriovenous fistula for hemodialysis, primary ([place])    RUE   Refusal of blood product    NO WHOLE BLOOD PROUCTS   Renal cell carcinoma ([place])    s/p hand assisted laparoscopic bilateral nephrectomies 11/29/17, + RCC left   Secondary hyperparathyroidism ([place])    Seizures ([place])    one episode in past, due to" elevated Potassium" 08/02/20- "at least 4 years ago"   Sleep apnea    doesn't use CPAP anymore since weight loss   Stroke [facility])    no residual     Procedures Performed: Right reverse total Shoulder Arthroplasty    Discharged Condition: good  Hospital Course: Patient brought in to [facility] for scheduled procedure. He tolerated procedure well.  He was kept for monitoring overnight for pain control, medical monitoring postop, OT, and dialysis. He was found to be stable for DC home the afternoon after surgery.  Patient was instructed on specific activity restrictions and all questions were answered.  Consults: Nephrology - patient required dialysis on his scheduled day   Significant Diagnostic Studies: No additional pertinent  studies  Treatments: Surgery, Dialysis  Discharge Exam: General: resting in hospital bed, NAD Cardiac: regular rate Pulmonary: no increased work of breathing RUE: Dressing CDI and sling well fitting,  full and painless ROM throughout hand with DPC of 0.  Axillary nerve sensation/motor altered in setting of block and unable to be fully tested.  Distal motor and sensory altered in setting of block.  Disposition: Discharge disposition: 01-Home or Self Care       Discharge Instructions     Call MD for:  redness, tenderness, or signs of infection (pain, swelling, redness, odor or green/yellow discharge around incision site)   Complete by: As directed    Call MD for:  severe uncontrolled pain   Complete by: As directed    Call MD for:  temperature >100.4   Complete by: As directed    Diet - low sodium heart healthy   Complete by: As directed       Allergies as of 08/25/2020       Reactions   Infed [iron Dextran] Other (See Comments)   Decreased BP    Oxycodone Nausea Only   Vicodin [hydrocodone-acetaminophen] Other (See Comments)   Decreased BP   Whole Blood Other (See Comments)   Patient refuses whole blood "unless I am going to die"   Hydrocodone-acetaminophen    Other reaction(s): lower BP   Diclofenac Other (See Comments)   Unknown  Medication List     STOP taking these medications    acetaminophen 650 MG CR tablet Commonly known as: TYLENOL Replaced by: acetaminophen 500 MG tablet   ibuprofen 800 MG tablet Commonly known as: ADVIL   traMADol 50 MG tablet Commonly known as: ULTRAM       TAKE these medications    acetaminophen 500 MG tablet Commonly known as: TYLENOL Take 2 tablets (1,000 mg total) by mouth every 8 (eight) hours for 14 days. Replaces: acetaminophen 650 MG CR tablet   atorvastatin 10 MG tablet Commonly known as: LIPITOR Take 10 mg by mouth daily.   cinacalcet 60 MG tablet Commonly known as: SENSIPAR Take 120 mg by mouth  daily.   cromolyn 4 % ophthalmic solution Commonly known as: OPTICROM Place 1 drop into both eyes daily as needed (redness).   Fosrenol 1000 MG Pack Generic drug: Lanthanum Carbonate Take 1 Package by mouth 3 (three) times daily. Mix with applesauce at meals   gabapentin 100 MG capsule Commonly known as: NEURONTIN Take 1 capsule (100 mg total) by mouth 3 (three) times daily for 14 days. For pain What changed:  when to take this additional instructions   Lokelma 10 g Pack packet Generic drug: sodium zirconium cyclosilicate Take 1 packet by mouth daily.   multivitamin Tabs tablet Take 1 tablet by mouth daily.   omeprazole 20 MG capsule Commonly known as: PRILOSEC Take 40 mg by mouth in the morning and at bedtime.   ondansetron 4 MG tablet Commonly known as: Zofran Take 1 tablet (4 mg total) by mouth every 8 (eight) hours as needed for up to 7 days for nausea or vomiting.   oxyCODONE 5 MG immediate release tablet Commonly known as: Oxy IR/ROXICODONE Take 1-2 pills every 6 hrs as needed for severe pain, no more than 6 per day What changed:  how much to take how to take this when to take this reasons to take this additional instructions   QC TUMERIC COMPLEX PO Take 1 capsule by mouth daily.   vitamin E 180 MG (400 UNITS) capsule Take 400 Units by mouth daily.       ASK your doctor about these medications    atorvastatin 20 MG tablet Commonly known as: LIPITOR Take 1 tablet (20 mg total) by mouth daily at 6 PM.   topiramate 25 MG tablet Commonly known as: TOPAMAX Take 1 tablet (25 mg total) by mouth 2 (two) times daily.        Noemi Chapel, PA-C

## 2020-08-31 DIAGNOSIS — N186 End stage renal disease: Secondary | ICD-10-CM | POA: Diagnosis not present

## 2020-08-31 DIAGNOSIS — R519 Headache, unspecified: Secondary | ICD-10-CM | POA: Diagnosis not present

## 2020-08-31 DIAGNOSIS — E875 Hyperkalemia: Secondary | ICD-10-CM | POA: Diagnosis not present

## 2020-08-31 DIAGNOSIS — D689 Coagulation defect, unspecified: Secondary | ICD-10-CM | POA: Diagnosis not present

## 2020-08-31 DIAGNOSIS — D631 Anemia in chronic kidney disease: Secondary | ICD-10-CM | POA: Diagnosis not present

## 2020-08-31 DIAGNOSIS — Z992 Dependence on renal dialysis: Secondary | ICD-10-CM | POA: Diagnosis not present

## 2020-08-31 DIAGNOSIS — D509 Iron deficiency anemia, unspecified: Secondary | ICD-10-CM | POA: Diagnosis not present

## 2020-08-31 DIAGNOSIS — N2581 Secondary hyperparathyroidism of renal origin: Secondary | ICD-10-CM | POA: Diagnosis not present

## 2020-09-01 MED FILL — Sodium Chloride IV Soln 0.9%: INTRAVENOUS | Qty: 1000 | Status: AC

## 2020-09-01 MED FILL — Heparin Sodium (Porcine) Inj 1000 Unit/ML: INTRAMUSCULAR | Qty: 30 | Status: AC

## 2020-09-02 DIAGNOSIS — D509 Iron deficiency anemia, unspecified: Secondary | ICD-10-CM | POA: Diagnosis not present

## 2020-09-02 DIAGNOSIS — M1712 Unilateral primary osteoarthritis, left knee: Secondary | ICD-10-CM | POA: Diagnosis not present

## 2020-09-02 DIAGNOSIS — N186 End stage renal disease: Secondary | ICD-10-CM | POA: Diagnosis not present

## 2020-09-02 DIAGNOSIS — D689 Coagulation defect, unspecified: Secondary | ICD-10-CM | POA: Diagnosis not present

## 2020-09-02 DIAGNOSIS — E875 Hyperkalemia: Secondary | ICD-10-CM | POA: Diagnosis not present

## 2020-09-02 DIAGNOSIS — R519 Headache, unspecified: Secondary | ICD-10-CM | POA: Diagnosis not present

## 2020-09-02 DIAGNOSIS — M25511 Pain in right shoulder: Secondary | ICD-10-CM | POA: Diagnosis not present

## 2020-09-02 DIAGNOSIS — Z992 Dependence on renal dialysis: Secondary | ICD-10-CM | POA: Diagnosis not present

## 2020-09-02 DIAGNOSIS — D631 Anemia in chronic kidney disease: Secondary | ICD-10-CM | POA: Diagnosis not present

## 2020-09-02 DIAGNOSIS — N2581 Secondary hyperparathyroidism of renal origin: Secondary | ICD-10-CM | POA: Diagnosis not present

## 2020-09-05 DIAGNOSIS — E875 Hyperkalemia: Secondary | ICD-10-CM | POA: Diagnosis not present

## 2020-09-05 DIAGNOSIS — D509 Iron deficiency anemia, unspecified: Secondary | ICD-10-CM | POA: Diagnosis not present

## 2020-09-05 DIAGNOSIS — N2581 Secondary hyperparathyroidism of renal origin: Secondary | ICD-10-CM | POA: Diagnosis not present

## 2020-09-05 DIAGNOSIS — N186 End stage renal disease: Secondary | ICD-10-CM | POA: Diagnosis not present

## 2020-09-05 DIAGNOSIS — D689 Coagulation defect, unspecified: Secondary | ICD-10-CM | POA: Diagnosis not present

## 2020-09-05 DIAGNOSIS — D631 Anemia in chronic kidney disease: Secondary | ICD-10-CM | POA: Diagnosis not present

## 2020-09-05 DIAGNOSIS — Z992 Dependence on renal dialysis: Secondary | ICD-10-CM | POA: Diagnosis not present

## 2020-09-05 DIAGNOSIS — R519 Headache, unspecified: Secondary | ICD-10-CM | POA: Diagnosis not present

## 2020-09-07 DIAGNOSIS — D689 Coagulation defect, unspecified: Secondary | ICD-10-CM | POA: Diagnosis not present

## 2020-09-07 DIAGNOSIS — R519 Headache, unspecified: Secondary | ICD-10-CM | POA: Diagnosis not present

## 2020-09-07 DIAGNOSIS — Z992 Dependence on renal dialysis: Secondary | ICD-10-CM | POA: Diagnosis not present

## 2020-09-07 DIAGNOSIS — D631 Anemia in chronic kidney disease: Secondary | ICD-10-CM | POA: Diagnosis not present

## 2020-09-07 DIAGNOSIS — N186 End stage renal disease: Secondary | ICD-10-CM | POA: Diagnosis not present

## 2020-09-07 DIAGNOSIS — E875 Hyperkalemia: Secondary | ICD-10-CM | POA: Diagnosis not present

## 2020-09-07 DIAGNOSIS — D509 Iron deficiency anemia, unspecified: Secondary | ICD-10-CM | POA: Diagnosis not present

## 2020-09-07 DIAGNOSIS — N2581 Secondary hyperparathyroidism of renal origin: Secondary | ICD-10-CM | POA: Diagnosis not present

## 2020-09-08 DIAGNOSIS — R7309 Other abnormal glucose: Secondary | ICD-10-CM | POA: Diagnosis not present

## 2020-09-08 DIAGNOSIS — H47323 Drusen of optic disc, bilateral: Secondary | ICD-10-CM | POA: Diagnosis not present

## 2020-09-08 DIAGNOSIS — H53461 Homonymous bilateral field defects, right side: Secondary | ICD-10-CM | POA: Diagnosis not present

## 2020-09-08 DIAGNOSIS — Z961 Presence of intraocular lens: Secondary | ICD-10-CM | POA: Diagnosis not present

## 2020-09-08 DIAGNOSIS — H17822 Peripheral opacity of cornea, left eye: Secondary | ICD-10-CM | POA: Diagnosis not present

## 2020-09-09 DIAGNOSIS — E875 Hyperkalemia: Secondary | ICD-10-CM | POA: Diagnosis not present

## 2020-09-09 DIAGNOSIS — N2581 Secondary hyperparathyroidism of renal origin: Secondary | ICD-10-CM | POA: Diagnosis not present

## 2020-09-09 DIAGNOSIS — D631 Anemia in chronic kidney disease: Secondary | ICD-10-CM | POA: Diagnosis not present

## 2020-09-09 DIAGNOSIS — D509 Iron deficiency anemia, unspecified: Secondary | ICD-10-CM | POA: Diagnosis not present

## 2020-09-09 DIAGNOSIS — Z992 Dependence on renal dialysis: Secondary | ICD-10-CM | POA: Diagnosis not present

## 2020-09-09 DIAGNOSIS — R519 Headache, unspecified: Secondary | ICD-10-CM | POA: Diagnosis not present

## 2020-09-09 DIAGNOSIS — H5213 Myopia, bilateral: Secondary | ICD-10-CM | POA: Diagnosis not present

## 2020-09-09 DIAGNOSIS — N186 End stage renal disease: Secondary | ICD-10-CM | POA: Diagnosis not present

## 2020-09-09 DIAGNOSIS — D689 Coagulation defect, unspecified: Secondary | ICD-10-CM | POA: Diagnosis not present

## 2020-09-11 DIAGNOSIS — N186 End stage renal disease: Secondary | ICD-10-CM | POA: Diagnosis not present

## 2020-09-11 DIAGNOSIS — I12 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 stage renal disease: Secondary | ICD-10-CM | POA: Diagnosis not present

## 2020-09-11 DIAGNOSIS — Z992 Dependence on renal dialysis: Secondary | ICD-10-CM | POA: Diagnosis not present

## 2020-09-12 DIAGNOSIS — D631 Anemia in chronic kidney disease: Secondary | ICD-10-CM | POA: Diagnosis not present

## 2020-09-12 DIAGNOSIS — Z992 Dependence on renal dialysis: Secondary | ICD-10-CM | POA: Diagnosis not present

## 2020-09-12 DIAGNOSIS — N186 End stage renal disease: Secondary | ICD-10-CM | POA: Diagnosis not present

## 2020-09-12 DIAGNOSIS — M1612 Unilateral primary osteoarthritis, left hip: Secondary | ICD-10-CM | POA: Diagnosis not present

## 2020-09-12 DIAGNOSIS — R519 Headache, unspecified: Secondary | ICD-10-CM | POA: Diagnosis not present

## 2020-09-12 DIAGNOSIS — N2581 Secondary hyperparathyroidism of renal origin: Secondary | ICD-10-CM | POA: Diagnosis not present

## 2020-09-12 DIAGNOSIS — D509 Iron deficiency anemia, unspecified: Secondary | ICD-10-CM | POA: Diagnosis not present

## 2020-09-12 DIAGNOSIS — D689 Coagulation defect, unspecified: Secondary | ICD-10-CM | POA: Diagnosis not present

## 2020-09-12 DIAGNOSIS — E875 Hyperkalemia: Secondary | ICD-10-CM | POA: Diagnosis not present

## 2020-09-13 DIAGNOSIS — Z79891 Long term (current) use of opiate analgesic: Secondary | ICD-10-CM | POA: Diagnosis not present

## 2020-09-13 DIAGNOSIS — M25519 Pain in unspecified shoulder: Secondary | ICD-10-CM | POA: Diagnosis not present

## 2020-09-14 DIAGNOSIS — Z992 Dependence on renal dialysis: Secondary | ICD-10-CM | POA: Diagnosis not present

## 2020-09-14 DIAGNOSIS — N186 End stage renal disease: Secondary | ICD-10-CM | POA: Diagnosis not present

## 2020-09-14 DIAGNOSIS — E875 Hyperkalemia: Secondary | ICD-10-CM | POA: Diagnosis not present

## 2020-09-14 DIAGNOSIS — D631 Anemia in chronic kidney disease: Secondary | ICD-10-CM | POA: Diagnosis not present

## 2020-09-14 DIAGNOSIS — D509 Iron deficiency anemia, unspecified: Secondary | ICD-10-CM | POA: Diagnosis not present

## 2020-09-14 DIAGNOSIS — D689 Coagulation defect, unspecified: Secondary | ICD-10-CM | POA: Diagnosis not present

## 2020-09-14 DIAGNOSIS — N2581 Secondary hyperparathyroidism of renal origin: Secondary | ICD-10-CM | POA: Diagnosis not present

## 2020-09-14 DIAGNOSIS — R519 Headache, unspecified: Secondary | ICD-10-CM | POA: Diagnosis not present

## 2020-09-16 DIAGNOSIS — D509 Iron deficiency anemia, unspecified: Secondary | ICD-10-CM | POA: Diagnosis not present

## 2020-09-16 DIAGNOSIS — D631 Anemia in chronic kidney disease: Secondary | ICD-10-CM | POA: Diagnosis not present

## 2020-09-16 DIAGNOSIS — R519 Headache, unspecified: Secondary | ICD-10-CM | POA: Diagnosis not present

## 2020-09-16 DIAGNOSIS — E875 Hyperkalemia: Secondary | ICD-10-CM | POA: Diagnosis not present

## 2020-09-16 DIAGNOSIS — N2581 Secondary hyperparathyroidism of renal origin: Secondary | ICD-10-CM | POA: Diagnosis not present

## 2020-09-16 DIAGNOSIS — D689 Coagulation defect, unspecified: Secondary | ICD-10-CM | POA: Diagnosis not present

## 2020-09-16 DIAGNOSIS — Z992 Dependence on renal dialysis: Secondary | ICD-10-CM | POA: Diagnosis not present

## 2020-09-16 DIAGNOSIS — N186 End stage renal disease: Secondary | ICD-10-CM | POA: Diagnosis not present

## 2020-09-17 ENCOUNTER — Encounter: Payer: Self-pay | Admitting: Internal Medicine

## 2020-09-19 DIAGNOSIS — E875 Hyperkalemia: Secondary | ICD-10-CM | POA: Diagnosis not present

## 2020-09-19 DIAGNOSIS — D509 Iron deficiency anemia, unspecified: Secondary | ICD-10-CM | POA: Diagnosis not present

## 2020-09-19 DIAGNOSIS — R519 Headache, unspecified: Secondary | ICD-10-CM | POA: Diagnosis not present

## 2020-09-19 DIAGNOSIS — N186 End stage renal disease: Secondary | ICD-10-CM | POA: Diagnosis not present

## 2020-09-19 DIAGNOSIS — D689 Coagulation defect, unspecified: Secondary | ICD-10-CM | POA: Diagnosis not present

## 2020-09-19 DIAGNOSIS — D631 Anemia in chronic kidney disease: Secondary | ICD-10-CM | POA: Diagnosis not present

## 2020-09-19 DIAGNOSIS — N2581 Secondary hyperparathyroidism of renal origin: Secondary | ICD-10-CM | POA: Diagnosis not present

## 2020-09-19 DIAGNOSIS — Z992 Dependence on renal dialysis: Secondary | ICD-10-CM | POA: Diagnosis not present

## 2020-09-21 DIAGNOSIS — N186 End stage renal disease: Secondary | ICD-10-CM | POA: Diagnosis not present

## 2020-09-21 DIAGNOSIS — D631 Anemia in chronic kidney disease: Secondary | ICD-10-CM | POA: Diagnosis not present

## 2020-09-21 DIAGNOSIS — R519 Headache, unspecified: Secondary | ICD-10-CM | POA: Diagnosis not present

## 2020-09-21 DIAGNOSIS — E875 Hyperkalemia: Secondary | ICD-10-CM | POA: Diagnosis not present

## 2020-09-21 DIAGNOSIS — Z992 Dependence on renal dialysis: Secondary | ICD-10-CM | POA: Diagnosis not present

## 2020-09-21 DIAGNOSIS — D689 Coagulation defect, unspecified: Secondary | ICD-10-CM | POA: Diagnosis not present

## 2020-09-21 DIAGNOSIS — N2581 Secondary hyperparathyroidism of renal origin: Secondary | ICD-10-CM | POA: Diagnosis not present

## 2020-09-21 DIAGNOSIS — D509 Iron deficiency anemia, unspecified: Secondary | ICD-10-CM | POA: Diagnosis not present

## 2020-09-23 DIAGNOSIS — Z992 Dependence on renal dialysis: Secondary | ICD-10-CM | POA: Diagnosis not present

## 2020-09-23 DIAGNOSIS — R519 Headache, unspecified: Secondary | ICD-10-CM | POA: Diagnosis not present

## 2020-09-23 DIAGNOSIS — M25511 Pain in right shoulder: Secondary | ICD-10-CM | POA: Diagnosis not present

## 2020-09-23 DIAGNOSIS — D689 Coagulation defect, unspecified: Secondary | ICD-10-CM | POA: Diagnosis not present

## 2020-09-23 DIAGNOSIS — N2581 Secondary hyperparathyroidism of renal origin: Secondary | ICD-10-CM | POA: Diagnosis not present

## 2020-09-23 DIAGNOSIS — E875 Hyperkalemia: Secondary | ICD-10-CM | POA: Diagnosis not present

## 2020-09-23 DIAGNOSIS — N186 End stage renal disease: Secondary | ICD-10-CM | POA: Diagnosis not present

## 2020-09-23 DIAGNOSIS — D631 Anemia in chronic kidney disease: Secondary | ICD-10-CM | POA: Diagnosis not present

## 2020-09-23 DIAGNOSIS — D509 Iron deficiency anemia, unspecified: Secondary | ICD-10-CM | POA: Diagnosis not present

## 2020-09-26 DIAGNOSIS — D509 Iron deficiency anemia, unspecified: Secondary | ICD-10-CM | POA: Diagnosis not present

## 2020-09-26 DIAGNOSIS — E875 Hyperkalemia: Secondary | ICD-10-CM | POA: Diagnosis not present

## 2020-09-26 DIAGNOSIS — R519 Headache, unspecified: Secondary | ICD-10-CM | POA: Diagnosis not present

## 2020-09-26 DIAGNOSIS — N2581 Secondary hyperparathyroidism of renal origin: Secondary | ICD-10-CM | POA: Diagnosis not present

## 2020-09-26 DIAGNOSIS — D631 Anemia in chronic kidney disease: Secondary | ICD-10-CM | POA: Diagnosis not present

## 2020-09-26 DIAGNOSIS — N186 End stage renal disease: Secondary | ICD-10-CM | POA: Diagnosis not present

## 2020-09-26 DIAGNOSIS — Z992 Dependence on renal dialysis: Secondary | ICD-10-CM | POA: Diagnosis not present

## 2020-09-26 DIAGNOSIS — D689 Coagulation defect, unspecified: Secondary | ICD-10-CM | POA: Diagnosis not present

## 2020-09-28 DIAGNOSIS — N186 End stage renal disease: Secondary | ICD-10-CM | POA: Diagnosis not present

## 2020-09-28 DIAGNOSIS — Z992 Dependence on renal dialysis: Secondary | ICD-10-CM | POA: Diagnosis not present

## 2020-09-28 DIAGNOSIS — R519 Headache, unspecified: Secondary | ICD-10-CM | POA: Diagnosis not present

## 2020-09-28 DIAGNOSIS — D509 Iron deficiency anemia, unspecified: Secondary | ICD-10-CM | POA: Diagnosis not present

## 2020-09-28 DIAGNOSIS — D689 Coagulation defect, unspecified: Secondary | ICD-10-CM | POA: Diagnosis not present

## 2020-09-28 DIAGNOSIS — E875 Hyperkalemia: Secondary | ICD-10-CM | POA: Diagnosis not present

## 2020-09-28 DIAGNOSIS — D631 Anemia in chronic kidney disease: Secondary | ICD-10-CM | POA: Diagnosis not present

## 2020-09-28 DIAGNOSIS — N2581 Secondary hyperparathyroidism of renal origin: Secondary | ICD-10-CM | POA: Diagnosis not present

## 2020-09-29 DIAGNOSIS — Z79891 Long term (current) use of opiate analgesic: Secondary | ICD-10-CM | POA: Diagnosis not present

## 2020-09-29 DIAGNOSIS — M25519 Pain in unspecified shoulder: Secondary | ICD-10-CM | POA: Diagnosis not present

## 2020-09-30 ENCOUNTER — Telehealth: Payer: Self-pay | Admitting: *Deleted

## 2020-09-30 DIAGNOSIS — D689 Coagulation defect, unspecified: Secondary | ICD-10-CM | POA: Diagnosis not present

## 2020-09-30 DIAGNOSIS — D631 Anemia in chronic kidney disease: Secondary | ICD-10-CM | POA: Diagnosis not present

## 2020-09-30 DIAGNOSIS — M1612 Unilateral primary osteoarthritis, left hip: Secondary | ICD-10-CM | POA: Diagnosis not present

## 2020-09-30 DIAGNOSIS — Z992 Dependence on renal dialysis: Secondary | ICD-10-CM | POA: Diagnosis not present

## 2020-09-30 DIAGNOSIS — D509 Iron deficiency anemia, unspecified: Secondary | ICD-10-CM | POA: Diagnosis not present

## 2020-09-30 DIAGNOSIS — E875 Hyperkalemia: Secondary | ICD-10-CM | POA: Diagnosis not present

## 2020-09-30 DIAGNOSIS — N2581 Secondary hyperparathyroidism of renal origin: Secondary | ICD-10-CM | POA: Diagnosis not present

## 2020-09-30 DIAGNOSIS — N186 End stage renal disease: Secondary | ICD-10-CM | POA: Diagnosis not present

## 2020-09-30 DIAGNOSIS — R519 Headache, unspecified: Secondary | ICD-10-CM | POA: Diagnosis not present

## 2020-09-30 NOTE — Telephone Encounter (Signed)
LM2CB-Pt needs appt

## 2020-09-30 NOTE — Telephone Encounter (Signed)
   [facility] Pre-operative Risk Assessment    Patient Name: Carl Debord Sr.  DOB: 11/19/1960 MRN: [id number]     Request for surgical clearance:  What type of surgery is being performed? LEFT TOTAL HIP ARTHROPLASTY   When is this surgery scheduled? TBD  What type of clearance is required (medical clearance vs. Pharmacy clearance to hold med vs. Both)? MEDICAL  Are there any medications that need to be held prior to surgery and how long? N/A  Practice name and name of physician performing surgery? [facility] ;DR [facility]   What is the office phone number? [phone]   7.   What is the office fax number? [phone] attn :[name]  8.   Anesthesia type (None, local, MAC, general) ? Choice   [name] 09/30/2020, 10:46 AM  _________________________________________________________________   (provider comments below)

## 2020-09-30 NOTE — Telephone Encounter (Signed)
Primary Cardiologist:Bridgette Harrell Gave, MD  Chart reviewed as part of pre-operative protocol coverage. Because of Zarian Colpitts Sr.'s past medical history and time since last visit, he/she will require a follow-up visit in order to better assess preoperative cardiovascular risk.  Pre-op covering staff: - Please schedule appointment and call patient to inform them. - Please contact requesting surgeon's office via preferred method (i.e, phone, fax) to inform them of need for appointment prior to surgery.  If applicable, this message will also be routed to pharmacy pool and/or primary cardiologist for input on holding anticoagulant/antiplatelet agent as requested below so that this information is available at time of patient's appointment.   Deberah Pelton, NP  09/30/2020, 10:56 AM

## 2020-10-03 ENCOUNTER — Telehealth: Payer: Self-pay | Admitting: Orthopaedic Surgery

## 2020-10-03 DIAGNOSIS — N186 End stage renal disease: Secondary | ICD-10-CM | POA: Diagnosis not present

## 2020-10-03 DIAGNOSIS — Z992 Dependence on renal dialysis: Secondary | ICD-10-CM | POA: Diagnosis not present

## 2020-10-03 DIAGNOSIS — R519 Headache, unspecified: Secondary | ICD-10-CM | POA: Diagnosis not present

## 2020-10-03 DIAGNOSIS — E875 Hyperkalemia: Secondary | ICD-10-CM | POA: Diagnosis not present

## 2020-10-03 DIAGNOSIS — D631 Anemia in chronic kidney disease: Secondary | ICD-10-CM | POA: Diagnosis not present

## 2020-10-03 DIAGNOSIS — N2581 Secondary hyperparathyroidism of renal origin: Secondary | ICD-10-CM | POA: Diagnosis not present

## 2020-10-03 DIAGNOSIS — D509 Iron deficiency anemia, unspecified: Secondary | ICD-10-CM | POA: Diagnosis not present

## 2020-10-03 DIAGNOSIS — D689 Coagulation defect, unspecified: Secondary | ICD-10-CM | POA: Diagnosis not present

## 2020-10-03 NOTE — Telephone Encounter (Signed)
Pt has appt 10/13/20 to see Dr. Buford Dresser for pre op clearance. I will forward notes to MD for upcoming appt. Will send FYI to surgeon's office pt has appt 10/13/20.

## 2020-10-03 NOTE — Telephone Encounter (Signed)
Thank you.  Have a nice day.

## 2020-10-03 NOTE — Telephone Encounter (Signed)
Pt called requesting a call back concerning his heart doctor faxed clearance for surgery. Please call pt when paperwork is received. Pt has questions. Pt phone number is [phone].

## 2020-10-03 NOTE — Telephone Encounter (Signed)
Left message for the pt to call back and schedule an appt for pre op clearance. Appt can be with Dr. Buford Dresser or APP.

## 2020-10-03 NOTE — Telephone Encounter (Signed)
Pt is returning call.  

## 2020-10-04 DIAGNOSIS — M25611 Stiffness of right shoulder, not elsewhere classified: Secondary | ICD-10-CM | POA: Diagnosis not present

## 2020-10-04 DIAGNOSIS — M19011 Primary osteoarthritis, right shoulder: Secondary | ICD-10-CM | POA: Diagnosis not present

## 2020-10-04 DIAGNOSIS — M25511 Pain in right shoulder: Secondary | ICD-10-CM | POA: Diagnosis not present

## 2020-10-04 DIAGNOSIS — M6281 Muscle weakness (generalized): Secondary | ICD-10-CM | POA: Diagnosis not present

## 2020-10-05 DIAGNOSIS — N2581 Secondary hyperparathyroidism of renal origin: Secondary | ICD-10-CM | POA: Diagnosis not present

## 2020-10-05 DIAGNOSIS — Z992 Dependence on renal dialysis: Secondary | ICD-10-CM | POA: Diagnosis not present

## 2020-10-05 DIAGNOSIS — D631 Anemia in chronic kidney disease: Secondary | ICD-10-CM | POA: Diagnosis not present

## 2020-10-05 DIAGNOSIS — R519 Headache, unspecified: Secondary | ICD-10-CM | POA: Diagnosis not present

## 2020-10-05 DIAGNOSIS — D509 Iron deficiency anemia, unspecified: Secondary | ICD-10-CM | POA: Diagnosis not present

## 2020-10-05 DIAGNOSIS — D689 Coagulation defect, unspecified: Secondary | ICD-10-CM | POA: Diagnosis not present

## 2020-10-05 DIAGNOSIS — N186 End stage renal disease: Secondary | ICD-10-CM | POA: Diagnosis not present

## 2020-10-05 DIAGNOSIS — E875 Hyperkalemia: Secondary | ICD-10-CM | POA: Diagnosis not present

## 2020-10-06 ENCOUNTER — Ambulatory Visit: Payer: Medicare Other | Admitting: Orthopaedic Surgery

## 2020-10-07 ENCOUNTER — Other Ambulatory Visit: Payer: Self-pay

## 2020-10-07 ENCOUNTER — Ambulatory Visit (INDEPENDENT_AMBULATORY_CARE_PROVIDER_SITE_OTHER): Payer: Medicare Other | Admitting: Orthopaedic Surgery

## 2020-10-07 ENCOUNTER — Encounter: Payer: Self-pay | Admitting: Orthopaedic Surgery

## 2020-10-07 DIAGNOSIS — D509 Iron deficiency anemia, unspecified: Secondary | ICD-10-CM | POA: Diagnosis not present

## 2020-10-07 DIAGNOSIS — Z992 Dependence on renal dialysis: Secondary | ICD-10-CM | POA: Diagnosis not present

## 2020-10-07 DIAGNOSIS — N2581 Secondary hyperparathyroidism of renal origin: Secondary | ICD-10-CM | POA: Diagnosis not present

## 2020-10-07 DIAGNOSIS — D631 Anemia in chronic kidney disease: Secondary | ICD-10-CM | POA: Diagnosis not present

## 2020-10-07 DIAGNOSIS — N186 End stage renal disease: Secondary | ICD-10-CM | POA: Diagnosis not present

## 2020-10-07 DIAGNOSIS — E875 Hyperkalemia: Secondary | ICD-10-CM | POA: Diagnosis not present

## 2020-10-07 DIAGNOSIS — R519 Headache, unspecified: Secondary | ICD-10-CM | POA: Diagnosis not present

## 2020-10-07 DIAGNOSIS — M1612 Unilateral primary osteoarthritis, left hip: Secondary | ICD-10-CM | POA: Diagnosis not present

## 2020-10-07 DIAGNOSIS — D689 Coagulation defect, unspecified: Secondary | ICD-10-CM | POA: Diagnosis not present

## 2020-10-07 NOTE — Progress Notes (Signed)
Office Visit Note   Patient: Carl Hirsch Sr.           Date of Birth: November 09, 1960           MRN: [id number] Visit Date: 10/07/2020              Requested by: [name], MD [address],  [place] [id number] PCP: [name], MD   Assessment & Plan: Visit Diagnoses:  1. Primary osteoarthritis of left hip     Plan: From our standpoint we are just waiting for the necessary clearances to schedule surgery.  Given the dialysis we will plan for surgery on either Tuesday or Thursday so he can dialyze the next day.  Questions encouraged and answered.  Follow-Up Instructions: No follow-ups on file.   Orders:  No orders of the defined types were placed in this encounter.  No orders of the defined types were placed in this encounter.     Procedures: No procedures performed   Clinical Data: No additional findings.   Subjective: Chief Complaint  Patient presents with   Left Hip - Pain   Left Knee - Pain    HPI Tim comes back today for chronic and severe left hip DJD pain.  He is requesting surgery as soon as possible.  He has an appointment with Dr. Shelton Silvas next week for preoperative clearance and risk assessment.  Review of Systems   Objective: Vital Signs: There were no vitals taken for this visit.  Physical Exam  Ortho Exam Left hip exam is unchanged. Specialty Comments:  No specialty comments available.  Imaging: No results found.   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 11/10/2019   Acute gastric ulcer with hemorrhage    Acute GI bleeding  07/31/2019   Hypotension of hemodialysis 07/31/2019   Diverticulitis large intestine 07/31/2019   Mass of soft tissue of shoulder 05/26/2019   Awaiting organ transplant status 03/18/2019   Finding of cocaine in blood 02/25/2019   Anaphylactic shock, unspecified, sequela 11/21/2018   Hypercalcemia 10/28/2018   Diarrhea, unspecified 08/08/2018   Hyperkalemia 03/12/2018   Patient is Jehovah's Witness 01/30/2018   Renal cell carcinoma of left kidney ([place]) 12/24/2017   Fluid overload, unspecified 07/06/2017   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   Other specified disorders of kidney and ureter 11/22/2016   Secondary hyperparathyroidism of renal origin ([place]) 10/23/2016   Other headache syndrome 08/21/2016   TIA (transient ischemic attack)    Essential hypertension    Seizures ([place])    Acute pericarditis    Atypical chest pain 10/01/2015   Abnormal EKG 10/01/2015   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    Lung nodule    Diverticulitis of cecum 07/21/2015   Diverticulitis of large intestine without perforation or abscess without bleeding    Right lower quadrant pain    Headache 05/05/2015   Obstructive sleep apnea 02/08/2015   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   Hypertension secondary to other renal disorders 08/26/2014   Diverticulosis 07/13/2014   Lower GI bleed 07/12/2014   Hematochezia    Refusal of blood transfusions as patient is [employer]  Witness    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   HLD (hyperlipidemia)    PRES (posterior reversible encephalopathy syndrome)    History of lacunar cerebrovascular accident (CVA) 05/23/2014   Cerebral infarction ([place]) 05/23/2014   Anemia of renal disease 05/23/2014   Encounter for fitting and adjustment of extracorporeal dialysis catheter ([place]) 05/07/2014   Shortness of breath 01/15/2013   HTN (hypertension) 12/09/2012   GERD (gastroesophageal reflux  disease) 12/09/2012   ESRD on dialysis ([place]) 12/09/2012   Gastrointestinal bleeding 11/12/2012   Melena 11/12/2012   Other psychoactive substance dependence, uncomplicated ([employer]) 37/62/8315   Iron deficiency anemia, unspecified 06/10/2012   Moderate protein-calorie malnutrition ([place]) 07/29/2009   Coagulation defect, unspecified ([place]) 08/29/2006   Type 2 diabetes mellitus with diabetic peripheral angiopathy without gangrene ([place]) 08/29/2006   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 stage renal disease ([place]) 08/02/2006   Past Medical History:  Diagnosis Date   Anemia    hx of   Anxiety    situational    Arthritis    on meds   Borderline diabetes    Cataract    bilateral sx   Chronic headaches    Depression    situational    Diverticulitis    ESRD (end stage renal disease) ([place])     On Renal Transplant List," [facility]; MWF" (10/23/2016)   GERD (gastroesophageal reflux disease)    with certain foods   GI bleed    Hypertension    diet controlled   Parathyroid abnormality ([facility])    ectopic parathyroid gland   Presence of arteriovenous fistula for hemodialysis, primary ([place])    RUE   Refusal of blood product    NO WHOLE BLOOD PROUCTS   Renal cell carcinoma ([place])    s/p hand assisted laparoscopic bilateral nephrectomies 11/29/17, + RCC left   Secondary hyperparathyroidism ([place])    Seizures ([place])    one episode in past, due to" elevated Potassium" 08/02/20- "at least 4 years ago"   Sleep apnea    doesn't use CPAP anymore since weight loss   Stroke ([place])    no residual    Family History  Problem Relation Age of Onset   Diabetes Father    Stroke Father    Hypertension Father    Uterine cancer Mother    Lupus Sister    Stroke Sister    Hypertension Sister    Anuerysm Brother        brain   Colon cancer Neg Hx    Esophageal cancer Neg Hx    Stomach cancer Neg Hx    Pancreatic cancer Neg Hx    Liver disease Neg Hx    Colon polyps  Neg Hx    Rectal cancer Neg Hx     Past Surgical History:  Procedure Laterality Date   AV FISTULA PLACEMENT Right    right arm   BIOPSY  08/01/2019   Procedure: BIOPSY;  Surgeon: Juanita Craver, MD;  Location: [facility];  Service: Endoscopy;;   CATARACT EXTRACTION W/ INTRAOCULAR LENS  IMPLANT, BILATERAL     COLON SURGERY     COLONOSCOPY N/A 08/04/2015   Procedure: COLONOSCOPY;  Surgeon: Jerene Bears, MD;  Location[facility] ENDOSCOPY;  Service: Endoscopy;  Laterality: N/A;   COLONOSCOPY  2017   JMP@ [facility]-good prep-mass -recall 1 yr   ESOPHAGOGASTRODUODENOSCOPY N/A 08/01/2019   Procedure: ESOPHAGOGASTRODUODENOSCOPY (EGD);  Surgeon: Juanita Craver, MD;  Location: [facility] ENDOSCOPY;  Service: Endoscopy;  Laterality: N/A;   ESOPHAGOGASTRODUODENOSCOPY (EGD) WITH PROPOFOL N/A 08/04/2019   Procedure: ESOPHAGOGASTRODUODENOSCOPY (EGD) WITH PROPOFOL;  Surgeon: Yetta Flock, MD;  Location: [facility];  Service: Gastroenterology;  Laterality: N/A;   graft left arm Left    for dialysis x 2. Removed   INSERTION OF DIALYSIS CATHETER     Rt chest   LAPAROSCOPIC RIGHT COLECTOMY N/A 08/05/2015   Procedure: LAPAROSCOPIC RIGHT COLECTOMY- ASCENDING;  Surgeon: Stark Klein, MD;  Location: [facility];  Service: General;  Laterality: N/A;   MASS EXCISION Left 05/28/2019   Procedure: EXCISION SOFT TISSUE MASS LEFT SHOULDER;  Surgeon: Armandina Gemma, MD;  Location: [facility];  Service: General;  Laterality: Left;   NEPHRECTOMY Bilateral    PARATHYROIDECTOMY N/A 06/12/2016   Procedure: TOTAL PARATHYROIDECTOMY WITH AUTOTRANSPLANTATION TO LEFT FOREARM;  Surgeon: Armandina Gemma, MD;  Location: [facility];  Service: General;  Laterality: N/A;   PARATHYROIDECTOMY N/A 10/23/2016   Procedure: PARATHYROIDECTOMY;  Surgeon: Armandina Gemma, MD;  Location: [facility];  Service: General;  Laterality: N/A;   REVERSE SHOULDER ARTHROPLASTY Right 08/24/2020   Procedure: REVERSE SHOULDER ARTHROPLASTY;  Surgeon: Hiram Gash, MD;  Location: [facility];   Service: Orthopedics;  Laterality: Right;   REVISON OF ARTERIOVENOUS FISTULA Right 07/16/2017   Procedure: REVISION OF ARTERIOVENOUS FISTULA  Right ARM;  Surgeon: Waynetta Sandy, MD;  Location: [facility];  Service: Vascular;  Laterality: Right;   UPPER GASTROINTESTINAL ENDOSCOPY  2021   @ [facility]   Social History   Occupational History   Occupation: retired    Fish farm manager: SELF EMPLOYED  Tobacco Use   Smoking status: Former    Types: Cigarettes    Quit date: 06/19/1990    Years since quitting: 30.3   Smokeless tobacco: Never   Tobacco comments:    rarely cigar  Vaping Use   Vaping Use: Never used  Substance and Sexual Activity   Alcohol use: Yes    Alcohol/week: 1.0 standard drink    Types: 1 Cans of beer per week    Comment: occasional beer   Drug use: Not Currently    Types: Marijuana    Comment: Last use several yrs ago   Sexual activity: Not on file

## 2020-10-10 DIAGNOSIS — E875 Hyperkalemia: Secondary | ICD-10-CM | POA: Diagnosis not present

## 2020-10-10 DIAGNOSIS — N186 End stage renal disease: Secondary | ICD-10-CM | POA: Diagnosis not present

## 2020-10-10 DIAGNOSIS — D689 Coagulation defect, unspecified: Secondary | ICD-10-CM | POA: Diagnosis not present

## 2020-10-10 DIAGNOSIS — D509 Iron deficiency anemia, unspecified: Secondary | ICD-10-CM | POA: Diagnosis not present

## 2020-10-10 DIAGNOSIS — D631 Anemia in chronic kidney disease: Secondary | ICD-10-CM | POA: Diagnosis not present

## 2020-10-10 DIAGNOSIS — N2581 Secondary hyperparathyroidism of renal origin: Secondary | ICD-10-CM | POA: Diagnosis not present

## 2020-10-10 DIAGNOSIS — Z992 Dependence on renal dialysis: Secondary | ICD-10-CM | POA: Diagnosis not present

## 2020-10-10 DIAGNOSIS — R519 Headache, unspecified: Secondary | ICD-10-CM | POA: Diagnosis not present

## 2020-10-11 DIAGNOSIS — D509 Iron deficiency anemia, unspecified: Secondary | ICD-10-CM | POA: Diagnosis not present

## 2020-10-11 DIAGNOSIS — D631 Anemia in chronic kidney disease: Secondary | ICD-10-CM | POA: Diagnosis not present

## 2020-10-11 DIAGNOSIS — E875 Hyperkalemia: Secondary | ICD-10-CM | POA: Diagnosis not present

## 2020-10-11 DIAGNOSIS — N2581 Secondary hyperparathyroidism of renal origin: Secondary | ICD-10-CM | POA: Diagnosis not present

## 2020-10-11 DIAGNOSIS — R519 Headache, unspecified: Secondary | ICD-10-CM | POA: Diagnosis not present

## 2020-10-11 DIAGNOSIS — D689 Coagulation defect, unspecified: Secondary | ICD-10-CM | POA: Diagnosis not present

## 2020-10-11 DIAGNOSIS — N186 End stage renal disease: Secondary | ICD-10-CM | POA: Diagnosis not present

## 2020-10-11 DIAGNOSIS — Z992 Dependence on renal dialysis: Secondary | ICD-10-CM | POA: Diagnosis not present

## 2020-10-12 DIAGNOSIS — Z992 Dependence on renal dialysis: Secondary | ICD-10-CM | POA: Diagnosis not present

## 2020-10-12 DIAGNOSIS — N2581 Secondary hyperparathyroidism of renal origin: Secondary | ICD-10-CM | POA: Diagnosis not present

## 2020-10-12 DIAGNOSIS — N186 End stage renal disease: Secondary | ICD-10-CM | POA: Diagnosis not present

## 2020-10-12 DIAGNOSIS — M25611 Stiffness of right shoulder, not elsewhere classified: Secondary | ICD-10-CM | POA: Diagnosis not present

## 2020-10-12 DIAGNOSIS — D689 Coagulation defect, unspecified: Secondary | ICD-10-CM | POA: Diagnosis not present

## 2020-10-12 DIAGNOSIS — M19011 Primary osteoarthritis, right shoulder: Secondary | ICD-10-CM | POA: Diagnosis not present

## 2020-10-12 DIAGNOSIS — D509 Iron deficiency anemia, unspecified: Secondary | ICD-10-CM | POA: Diagnosis not present

## 2020-10-12 DIAGNOSIS — M25511 Pain in right shoulder: Secondary | ICD-10-CM | POA: Diagnosis not present

## 2020-10-12 DIAGNOSIS — I12 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 stage renal disease: Secondary | ICD-10-CM | POA: Diagnosis not present

## 2020-10-12 DIAGNOSIS — R519 Headache, unspecified: Secondary | ICD-10-CM | POA: Diagnosis not present

## 2020-10-12 DIAGNOSIS — M6281 Muscle weakness (generalized): Secondary | ICD-10-CM | POA: Diagnosis not present

## 2020-10-12 DIAGNOSIS — D631 Anemia in chronic kidney disease: Secondary | ICD-10-CM | POA: Diagnosis not present

## 2020-10-12 DIAGNOSIS — E875 Hyperkalemia: Secondary | ICD-10-CM | POA: Diagnosis not present

## 2020-10-13 ENCOUNTER — Encounter (HOSPITAL_BASED_OUTPATIENT_CLINIC_OR_DEPARTMENT_OTHER): Payer: Self-pay | Admitting: Cardiology

## 2020-10-13 ENCOUNTER — Ambulatory Visit (INDEPENDENT_AMBULATORY_CARE_PROVIDER_SITE_OTHER): Payer: Medicare Other | Admitting: Cardiology

## 2020-10-13 ENCOUNTER — Other Ambulatory Visit: Payer: Self-pay

## 2020-10-13 VITALS — BP 100/73 | HR 95 | Ht 73.5 in | Wt 197.9 lb

## 2020-10-13 DIAGNOSIS — Z7189 Other specified counseling: Secondary | ICD-10-CM | POA: Diagnosis not present

## 2020-10-13 DIAGNOSIS — Z01810 Encounter for preprocedural cardiovascular examination: Secondary | ICD-10-CM | POA: Diagnosis not present

## 2020-10-13 DIAGNOSIS — Z8673 Personal history of transient ischemic attack (TIA), and cerebral infarction without residual deficits: Secondary | ICD-10-CM

## 2020-10-13 DIAGNOSIS — Z992 Dependence on renal dialysis: Secondary | ICD-10-CM | POA: Diagnosis not present

## 2020-10-13 DIAGNOSIS — I358 Other nonrheumatic aortic valve disorders: Secondary | ICD-10-CM | POA: Diagnosis not present

## 2020-10-13 DIAGNOSIS — N186 End stage renal disease: Secondary | ICD-10-CM

## 2020-10-13 NOTE — Patient Instructions (Signed)
Medication Instructions:  Your Physician recommend you continue on your current medication as directed.    *If you need a refill on your cardiac medications before your next appointment, please call your pharmacy*   Lab Work: None ordered today   Testing/Procedures: Your physician has requested that you have a lexiscan myoview. For further information please visit [email or web address]. Please follow instruction sheet, as given. [place]   Follow-Up: At [facility], you and your health needs are our priority.  As part of our continuing mission to provide you with exceptional heart care, we have created designated Provider Care Teams.  These Care Teams include your primary Cardiologist (physician) and Advanced Practice Providers (APPs -  Physician Assistants and Nurse Practitioners) who all work together to provide you with the care you need, when you need it.  We recommend signing up for the patient portal called "MyChart".  Sign up information is provided on this After Visit Summary.  MyChart is used to connect with patients for Virtual Visits (Telemedicine).  Patients are able to view lab/test results, encounter notes, upcoming appointments, etc.  Non-urgent messages can be sent to your provider as well.   To learn more about what you can do with MyChart, go to [email or web address].    Your next appointment:   1 year(s)  The format for your next appointment:   In Person  Provider:   Buford Dresser, MD  You are scheduled for a Myocardial Perfusion Imaging Study.  Please arrive 15 minutes prior to your appointment time for registration and insurance purposes.  The test will take approximately 3 to 4 hours to complete; you may bring reading material.  If someone comes with you to your appointment, they will need to remain in the main lobby due to limited space in the testing area. **If you are pregnant or breastfeeding, please notify the nuclear lab  prior to your appointment**  How to prepare for your Myocardial Perfusion Test: Do not eat or drink 3 hours prior to your test, except you may have water. Do not consume products containing caffeine (regular or decaffeinated) 12 hours prior to your test. (ex: coffee, chocolate, sodas, tea). Do bring a list of your current medications with you.  If not listed below, you may take your medications as normal. Do wear comfortable clothes (no dresses or overalls) and walking shoes, tennis shoes preferred (No heels or open toe shoes are allowed). Do NOT wear cologne, perfume, aftershave, or lotions (deodorant is allowed). If these instructions are not followed, your test will have to be rescheduled.  Please report to [place], [address] for your test.  If you have questions or concerns about your appointment, you can call the Nuclear Lab at [phone].  If you cannot keep your appointment, please provide 24 hours notification to the Nuclear Lab, to avoid a possible $50 charge to your account.

## 2020-10-13 NOTE — Progress Notes (Signed)
Cardiology Office Note:    Date:  10/13/2020   ID:  Carl Found Sr., DOB 1960-12-25, MRN [id number]  PCP:  [name], MD  Cardiologist:  Buford Dresser, MD  Referring MD: [name], MD   CC: follow up  History of Present Illness:    Carl Alvizo Sr. is a 60 y.o. male with a hx of ESRD on dialysis, carotid artery plaque who is seen as a new consult at the request of Koirala, Dibas, MD for the evaluation and management of cardiovascular management pre-kidney transplant. He has not been seen in the practice since 2016 (Dr. Debara Pickett).  Cardiac history: He was told that there was something with his heart that got him kicked off the transplant list. Per note in Care Everywhere dated 09/30/19, "Called and informed that pt that he would not be a candidate for txp here d/t low BP and abnormal cardiac testing. Told that he could pursue txp with another center if he desired. He was disappointed with the decision but verbalized understanding. Eval closed appts cancelled and letter sent. 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 This is noted to be seen previously in 2018 and 2020.   Today: Here for preoperative cardiovascular risk assessment prior to hip surgery in addition to his annual visit.  Planned surgery: total hip arthroplasty, Dr. 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 Has had hypotension on  dialysis in the past History of diabetes: has never been on medication, diet controlled History of CKD: yes, ESRD on dialysis History of OSA: yes, history in the past, but has not needed CPAP since he lost a lot of weight History of anesthesia complications: none Current symptoms: Denies chest pain, shortness of breath at rest or with normal exertion. No PND, orthopnea, LE edema or unexpected weight gain. No syncope or palpitations.  Functional capacity: currently very limited due to leg pain, in a wheelchair today  Past Medical History:  Diagnosis Date   Acute gastric ulcer with hemorrhage    Acute GI bleeding 07/31/2019   Acute pericarditis    Anemia    hx of   Anxiety    situational    Arthritis    on meds   Borderline diabetes    Cataract    bilateral sx   Chronic headaches    Depression    situational    Diverticulitis    ESRD (end stage renal disease) ([place])     On Renal Transplant List," [facility]; MWF" (10/23/2016)   GERD (gastroesophageal reflux disease)    with certain foods   GI bleed    Hypertension    diet controlled   Parathyroid abnormality ([place])    ectopic parathyroid gland   Presence of arteriovenous fistula for hemodialysis, primary ([place])    RUE   Refusal of blood product    NO WHOLE BLOOD PROUCTS   Renal cell carcinoma ([place])    s/p hand assisted laparoscopic bilateral nephrectomies 11/29/17, + RCC left   Secondary hyperparathyroidism ([place])  Seizures ([place])    one episode in past, due to" elevated Potassium" 08/02/20- "at least 4 years ago"   Sleep apnea    doesn't use CPAP anymore since weight loss   Stroke [facility])    no residual    Past Surgical History:  Procedure Laterality Date   AV FISTULA PLACEMENT Right    right arm   BIOPSY  08/01/2019   Procedure: BIOPSY;  Surgeon: Juanita Craver, MD;  Location: [place];  Service: Endoscopy;;   CATARACT EXTRACTION W/ INTRAOCULAR LENS  IMPLANT, BILATERAL     COLON SURGERY     COLONOSCOPY N/A  08/04/2015   Procedure: COLONOSCOPY;  Surgeon: Jerene Bears, MD;  Location: [facility] ENDOSCOPY;  Service: Endoscopy;  Laterality: N/A;   COLONOSCOPY  2017   JMP@ [facility]-good prep-mass -recall 1 yr   ESOPHAGOGASTRODUODENOSCOPY N/A 08/01/2019   Procedure: ESOPHAGOGASTRODUODENOSCOPY (EGD);  Surgeon: Juanita Craver, MD;  Location: [facility] ENDOSCOPY;  Service: Endoscopy;  Laterality: N/A;   ESOPHAGOGASTRODUODENOSCOPY (EGD) WITH PROPOFOL N/A 08/04/2019   Procedure: ESOPHAGOGASTRODUODENOSCOPY (EGD) WITH PROPOFOL;  Surgeon: Yetta Flock, MD;  Location: [place];  Service: Gastroenterology;  Laterality: N/A;   graft left arm Left    for dialysis x 2. Removed   INSERTION OF DIALYSIS CATHETER     Rt chest   LAPAROSCOPIC RIGHT COLECTOMY N/A 08/05/2015   Procedure: LAPAROSCOPIC RIGHT COLECTOMY- ASCENDING;  Surgeon: Stark Klein, MD;  Location: [place];  Service: General;  Laterality: N/A;   MASS EXCISION Left 05/28/2019   Procedure: EXCISION SOFT TISSUE MASS LEFT SHOULDER;  Surgeon: Armandina Gemma, MD;  Location: [facility];  Service: General;  Laterality: Left;   NEPHRECTOMY Bilateral    PARATHYROIDECTOMY N/A 06/12/2016   Procedure: TOTAL PARATHYROIDECTOMY WITH AUTOTRANSPLANTATION TO LEFT FOREARM;  Surgeon: Armandina Gemma, MD;  Location: [place];  Service: General;  Laterality: N/A;   PARATHYROIDECTOMY N/A 10/23/2016   Procedure: PARATHYROIDECTOMY;  Surgeon: Armandina Gemma, MD;  Location: [place];  Service: General;  Laterality: N/A;   REVERSE SHOULDER ARTHROPLASTY Right 08/24/2020   Procedure: REVERSE SHOULDER ARTHROPLASTY;  Surgeon: Hiram Gash, MD;  Location: [place];  Service: Orthopedics;  Laterality: Right;   REVISON OF ARTERIOVENOUS FISTULA Right 07/16/2017   Procedure: REVISION OF ARTERIOVENOUS FISTULA  Right ARM;  Surgeon: Waynetta Sandy, MD;  Location: [place];  Service: Vascular;  Laterality: Right;   UPPER GASTROINTESTINAL ENDOSCOPY  2021   @ [facility]    Current Medications: Current Outpatient  Medications on File Prior to Visit  Medication Sig   atorvastatin (LIPITOR) 10 MG tablet Take 10 mg by mouth daily.   cinacalcet (SENSIPAR) 60 MG tablet Take 120 mg by mouth daily.   cromolyn (OPTICROM) 4 % ophthalmic solution Place 1 drop into both eyes daily as needed (redness).   HYDROcodone-acetaminophen (NORCO/VICODIN) 5-325 MG tablet Take by mouth.   Lanthanum Carbonate (FOSRENOL) 1000 MG PACK Take 1 Package by mouth 3 (three) times daily. Mix with applesauce at meals   LOKELMA 10 g PACK packet Take 1 packet by mouth daily.   multivitamin (RENA-VIT) TABS tablet Take 1 tablet by mouth daily.   omeprazole (PRILOSEC) 20 MG capsule Take 40 mg by mouth in the morning and at bedtime.   oxyCODONE (OXY IR/ROXICODONE) 5 MG immediate release tablet Take by mouth.   topiramate (TOPAMAX) 25 MG tablet Take 1 tablet (25 mg total) by mouth 2 (two) times daily. (Patient taking differently: Take 25 mg by mouth daily.)   Turmeric (QC TUMERIC COMPLEX PO) Take  1 capsule by mouth daily.   vitamin E 180 MG (400 UNITS) capsule Take 400 Units by mouth daily.   gabapentin (NEURONTIN) 100 MG capsule Take 1 capsule (100 mg total) by mouth 3 (three) times daily for 14 days. For pain   No current facility-administered medications on file prior to visit.     Allergies:   Infed [iron dextran], Oxycodone, Vicodin [hydrocodone-acetaminophen], Whole blood, Hydrocodone-acetaminophen, and Diclofenac   Social History   Tobacco Use   Smoking status: Former    Types: Cigarettes    Quit date: 06/19/1990    Years since quitting: 30.3   Smokeless tobacco: Never   Tobacco comments:    rarely cigar  Vaping Use   Vaping Use: Never used  Substance Use Topics   Alcohol use: Yes    Alcohol/week: 1.0 standard drink    Types: 1 Cans of beer per week    Comment: occasional beer   Drug use: Not Currently    Types: Marijuana    Comment: Last use several yrs ago    Family History: family history includes Anuerysm in his  brother; Diabetes in his father; Hypertension in his father and sister; Lupus in his sister; Stroke in his father and sister; Uterine cancer in his mother. There is no history of Colon cancer, Esophageal cancer, Stomach cancer, Pancreatic cancer, Liver disease, Colon polyps, or Rectal cancer.  ROS:   Please see the history of present illness.  Additional pertinent ROS otherwise unremarkable.  EKGs/Labs/Other Studies Reviewed:    The following studies were reviewed today: Echo 09/29/19 (Care Everywhere)  SUMMARY  The left ventricular size is normal.  There is mild concentric left ventricular hypertrophy.  Left ventricular systolic function is normal.  LV ejection fraction = 55-60%.  No segmental wall motion abnormalities seen in the left ventricle  Left ventricular filling pattern is indeterminate.  The right ventricle is normal in size and function.  The left atrium is mildly to moderately dilated.  The aortic valve is trileaflet and opens well.  There is a 1.5 to 2 cm thin, filamentous, hypermobile echodensity  noted on the ventricular side of the aortic valve, likely representing  lambl's excrescence.  Estimated right ventricular systolic pressure is 20 mmHg.  No pulmonary hypertension.  The aortic sinus is normal size.  Mildly dilated ascending aorta.  IVC size was normal.  There is no pericardial effusion.  Compared to prior study, probably no significant change, filamentous  structure visualized on aortic valve also visible in 2020 and 2018.  Clinical correlation required.   Stress echo 03/25/2018 (Care Everywhere) SUMMARY  The patient had no chest pain during stress  The patient achieved 89 % of maximum predicted heart rate.  Negative stress ECG for inducible ischemia at target heart rate.  Negative dobutamine echocardiography for inducible ischemia at target heart  rate.  -  FINDINGS:  -  ECG REST  The baseline ECG displays normal sinus rhythm.  -  ECG STRESS  No  diagnostic ST segment changes were seen. Negative stress ECG for inducible  ischemia at target heart rate. The stress protocol, dobutamine perfusion  time, ECG changes, blood pressure and heart rate responses are shown in the  Cardiology Scan portion of this report in EPIC.  -  REST ECHO  Normal left ventricular function at rest. There were no segmental wall motion  abnormalities at rest. The estimated LV ejection fraction is 55-60% .  -  LOW DOSE  There was normal augmentation of all left  ventricular wall segments with  dobutamine. There were no segmental wall motion abnormalities with low dose  of dobutamine.  -  PEAK DOSE  Normal left ventricular function and global wall motion with stress. Global  LV function is preserved with stress. There were no segmental wall motion  abnormalities with dobutamine. There was normal augmentation of all left  ventricular wall segments with dobutamine. The estimated LV ejection fraction  is 65-70% with stress. Negative dobutamine echocardiography for inducible  ischemia at target heart rate.  -  -  WALL MOTION  -  Stress Results  Protocol: DobutamineMaximum Predicted HR: 161 bpm  Target HR: 137 bpm% Maximum Predicted HR: 89 %  StageDuration  (mm:ss) Heart Rate  (bpm) BPDose Comment  13:00 85 165/10010.00  23:00 95 162/9220.00  33:00 127 136/7830.00 1mg  Atropine  42:25 144 120/7240.00 .2 Atropine  1R2:00 148 109/62  22:00 146 104/58  316:31 110 137/81  128/81  Stress Duration: 11:24 mm:ss *Recovery Time: 2:00 mm:ss  Maximum Stress HR: 144 bpm *    Echo 10/03/15 - Left ventricle: The cavity size was normal. Wall thickness was    increased in a pattern of mild LVH. There was moderate focal    basal hypertrophy of the septum. Systolic function was normal.    The estimated ejection fraction was in the range of 60% to 65%.    Wall motion was normal; there were no regional wall motion    abnormalities. Doppler parameters are consistent  with abnormal    left ventricular relaxation (grade 1 diastolic dysfunction). The    E/e&' ratio is <8, suggesting normal LV filling pressure.  - Left atrium: The atrium was mildly dilated.  - Right atrium: The atrium was mildly dilated.  - Inferior vena cava: The vessel was normal in size. The    respirophasic diameter changes were in the normal range (>= 50%),    consistent with normal central venous pressure.   Impressions:   - Compared to a prior study in 2016, the LVEF is higher at 60-65%.    There is mild LVH with moderate basal septal hypertrophy, normal    wall motion and mild biatrial enlargement.   EKG:  EKG is personally reviewed.   10/13/20 NSR, RBBB, 96 bpm  Recent Labs: 08/25/2020: BUN 82; Creatinine, Ser 13.71; Hemoglobin 10.2; Platelets 254; Potassium 5.4; Sodium 134  Recent Lipid Panel    Component Value Date/Time   CHOL 157 03/08/2020 0831   TRIG 60 03/08/2020 0831   HDL 61 03/08/2020 0831   CHOLHDL 2.6 03/08/2020 0831   CHOLHDL 2.9 10/02/2015 0347   VLDL 13 10/02/2015 0347   LDLCALC 84 03/08/2020 0831    Physical Exam:    VS:  BP 100/73   Pulse 95   Ht 6' 1.5" (1.867 m)   Wt 197 lb 13.8 oz (89.8 kg)   SpO2 100%   BMI 25.75 kg/m     Wt Readings from Last 3 Encounters:  10/13/20 197 lb 13.8 oz (89.8 kg)  08/25/20 209 lb 7 oz (95 kg)  08/03/20 210 lb 15.7 oz (95.7 kg)    GEN: Well nourished, well developed in no acute distress HEENT: Normal, moist mucous membranes NECK: No JVD CARDIAC: regular rhythm, normal S1 and S2, no rubs or gallops. No murmur. VASCULAR: Radial and DP pulses 2+ bilaterally. No carotid bruits RESPIRATORY:  Clear to auscultation without rales, wheezing or rhonchi  ABDOMEN: Soft, non-tender, non-distended MUSCULOSKELETAL:  Ambulates independently, though short distance, with cane SKIN:  Warm and dry, no edema NEUROLOGIC:  Alert and oriented x 3. No focal neuro deficits noted. PSYCHIATRIC:  Normal affect    ASSESSMENT:    1.  Preop cardiovascular exam   2. ESRD on dialysis ([place])   3. History of lacunar cerebrovascular accident (CVA)   4. 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 Cardiac risk counseling   6. Counseling on health promotion and disease prevention     PLAN:    Preoperative cardiovascular exam: Based on available date, patient's RCRI score = 2 (history of TIA/CVA, ESRD), which carries a 10.1% 30-day risk of death, MI, or cardiac arrest.  The patient is not currently having active cardiac symptoms, but they cannot achieve >4 METs of activity.  According to ACC/AHA Guidelines, we will proceed with stress testing.  Our service is available as needed in the peri-operative period.    Shared Decision Making/Informed Consent{ The risks [chest pain, shortness of breath, cardiac arrhythmias, dizziness, blood pressure fluctuations, myocardial infarction, stroke/transient ischemic attack, nausea, vomiting, allergic reaction, radiation exposure, metallic taste sensation and life-threatening complications (estimated to be 1 in 10,000)], benefits (risk stratification, diagnosing coronary artery disease, treatment guidance) and alternatives of a nuclear stress test were discussed in detail with [name] and he agrees to proceed.   Lambl's excrescence: noted on recent echo, with comments that it was also seen in 2018 and 2020. We reviewed his history of CVA. Based on imaging, this was not felt to be an embolic stroke, and it was not acute. Comments note lacunar infarct and chronic microvascular changes.  -we discussed that typically indication for resection of these is embolic stroke or recurrent stroke. He does not meet this criteria. -aspirin stopped due history of GI bleed -I am unclear why this would take him off of the transplant list, especially as it was noted in the past as well. I am uncertain if there is another reason based on his echo that he would no longer be a transplant candidate.    ESRD Dialysis-associated hypotension Was undergoing transplant evaluation at [facility], now awaiting evaluation with [facility] Does not require midodrine with dialysis any more  Cardiac risk counseling and prevention recommendations: -recommend heart healthy/Mediterranean diet, with whole grains, fruits, vegetable, fish, lean meats, nuts, and olive oil. Limit salt. -recommend moderate walking, 3-5 times/week for 30-50 minutes each session. Aim for at least 150 minutes.week. Goal should be pace of 3 miles/hours, or walking 1.5 miles in 30 minutes -recommend avoidance of tobacco products. Avoid excess alcohol.  Plan for follow up: 1 year  Buford Dresser, MD, PhD [place]  [facility]    Medication Adjustments/Labs and Tests Ordered: Current medicines are reviewed at length with the patient today.  Concerns regarding medicines are outlined above.  Orders Placed This Encounter  Procedures   Cardiac Stress Test: Informed Consent Details: Physician/Practitioner Attestation; Transcribe to consent form and obtain patient signature   MYOCARDIAL PERFUSION IMAGING   EKG 12-Lead    No orders of the defined types were placed in this encounter.   Patient Instructions  Medication Instructions:  Your Physician recommend you continue on your current medication as directed.    *If you need a refill on your cardiac medications before your next appointment, please call your pharmacy*   Lab Work: None ordered today   Testing/Procedures: Your physician has requested that you have a lexiscan myoview. For further information please visit [email or web address]. Please follow instruction sheet, as given. [place]. [address]   Follow-Up:  At [facility], you and your health needs are our priority.  As part of our continuing mission to provide you with exceptional heart care, we have created designated Provider Care Teams.  These Care Teams include your primary Cardiologist  (physician) and Advanced Practice Providers (APPs -  Physician Assistants and Nurse Practitioners) who all work together to provide you with the care you need, when you need it.  We recommend signing up for the patient portal called "MyChart".  Sign up information is provided on this After Visit Summary.  MyChart is used to connect with patients for Virtual Visits (Telemedicine).  Patients are able to view lab/test results, encounter notes, upcoming appointments, etc.  Non-urgent messages can be sent to your provider as well.   To learn more about what you can do with MyChart, go to [email or web address].    Your next appointment:   1 year(s)  The format for your next appointment:   In Person  Provider:   Buford Dresser, MD  You are scheduled for a Myocardial Perfusion Imaging Study.  Please arrive 15 minutes prior to your appointment time for registration and insurance purposes.  The test will take approximately 3 to 4 hours to complete; you may bring reading material.  If someone comes with you to your appointment, they will need to remain in the main lobby due to limited space in the testing area. **If you are pregnant or breastfeeding, please notify the nuclear lab prior to your appointment**  How to prepare for your Myocardial Perfusion Test: Do not eat or drink 3 hours prior to your test, except you may have water. Do not consume products containing caffeine (regular or decaffeinated) 12 hours prior to your test. (ex: coffee, chocolate, sodas, tea). Do bring a list of your current medications with you.  If not listed below, you may take your medications as normal. Do wear comfortable clothes (no dresses or overalls) and walking shoes, tennis shoes preferred (No heels or open toe shoes are allowed). Do NOT wear cologne, perfume, aftershave, or lotions (deodorant is allowed). If these instructions are not followed, your test will have to be rescheduled.  Please report to [place], [address] for your test.  If you have questions or concerns about your appointment, you can call the Nuclear Lab at [phone].  If you cannot keep your appointment, please provide 24 hours notification to the Nuclear Lab, to avoid a possible $50 charge to your account.   Signed, Buford Dresser, MD PhD 10/13/2020 2:14 PM    [facility]

## 2020-10-14 ENCOUNTER — Telehealth: Payer: Self-pay | Admitting: Orthopaedic Surgery

## 2020-10-14 DIAGNOSIS — N186 End stage renal disease: Secondary | ICD-10-CM | POA: Diagnosis not present

## 2020-10-14 DIAGNOSIS — R519 Headache, unspecified: Secondary | ICD-10-CM | POA: Diagnosis not present

## 2020-10-14 DIAGNOSIS — E875 Hyperkalemia: Secondary | ICD-10-CM | POA: Diagnosis not present

## 2020-10-14 DIAGNOSIS — D631 Anemia in chronic kidney disease: Secondary | ICD-10-CM | POA: Diagnosis not present

## 2020-10-14 DIAGNOSIS — N2581 Secondary hyperparathyroidism of renal origin: Secondary | ICD-10-CM | POA: Diagnosis not present

## 2020-10-14 DIAGNOSIS — Z992 Dependence on renal dialysis: Secondary | ICD-10-CM | POA: Diagnosis not present

## 2020-10-14 DIAGNOSIS — D509 Iron deficiency anemia, unspecified: Secondary | ICD-10-CM | POA: Diagnosis not present

## 2020-10-14 DIAGNOSIS — D689 Coagulation defect, unspecified: Secondary | ICD-10-CM | POA: Diagnosis not present

## 2020-10-14 DIAGNOSIS — M1612 Unilateral primary osteoarthritis, left hip: Secondary | ICD-10-CM

## 2020-10-14 NOTE — Telephone Encounter (Signed)
Is this ok?

## 2020-10-14 NOTE — Telephone Encounter (Signed)
Pt called requesting a call back from Dr. Erlinda Hong. Pt is asking for physical therapy until surgery. Pt states he is barely walking and need physical therapy so he wont lose his leg strength. Please call pt at [phone].

## 2020-10-17 DIAGNOSIS — Z992 Dependence on renal dialysis: Secondary | ICD-10-CM | POA: Diagnosis not present

## 2020-10-17 DIAGNOSIS — D509 Iron deficiency anemia, unspecified: Secondary | ICD-10-CM | POA: Diagnosis not present

## 2020-10-17 DIAGNOSIS — R519 Headache, unspecified: Secondary | ICD-10-CM | POA: Diagnosis not present

## 2020-10-17 DIAGNOSIS — D631 Anemia in chronic kidney disease: Secondary | ICD-10-CM | POA: Diagnosis not present

## 2020-10-17 DIAGNOSIS — N2581 Secondary hyperparathyroidism of renal origin: Secondary | ICD-10-CM | POA: Diagnosis not present

## 2020-10-17 DIAGNOSIS — N186 End stage renal disease: Secondary | ICD-10-CM | POA: Diagnosis not present

## 2020-10-17 DIAGNOSIS — E875 Hyperkalemia: Secondary | ICD-10-CM | POA: Diagnosis not present

## 2020-10-17 DIAGNOSIS — D689 Coagulation defect, unspecified: Secondary | ICD-10-CM | POA: Diagnosis not present

## 2020-10-17 NOTE — Telephone Encounter (Signed)
yes

## 2020-10-18 NOTE — Telephone Encounter (Signed)
Referral placed in the chart and pt was advised

## 2020-10-19 ENCOUNTER — Telehealth (HOSPITAL_COMMUNITY): Payer: Self-pay | Admitting: *Deleted

## 2020-10-19 DIAGNOSIS — N2581 Secondary hyperparathyroidism of renal origin: Secondary | ICD-10-CM | POA: Diagnosis not present

## 2020-10-19 DIAGNOSIS — R519 Headache, unspecified: Secondary | ICD-10-CM | POA: Diagnosis not present

## 2020-10-19 DIAGNOSIS — D509 Iron deficiency anemia, unspecified: Secondary | ICD-10-CM | POA: Diagnosis not present

## 2020-10-19 DIAGNOSIS — D689 Coagulation defect, unspecified: Secondary | ICD-10-CM | POA: Diagnosis not present

## 2020-10-19 DIAGNOSIS — N186 End stage renal disease: Secondary | ICD-10-CM | POA: Diagnosis not present

## 2020-10-19 DIAGNOSIS — Z992 Dependence on renal dialysis: Secondary | ICD-10-CM | POA: Diagnosis not present

## 2020-10-19 DIAGNOSIS — D631 Anemia in chronic kidney disease: Secondary | ICD-10-CM | POA: Diagnosis not present

## 2020-10-19 DIAGNOSIS — E875 Hyperkalemia: Secondary | ICD-10-CM | POA: Diagnosis not present

## 2020-10-19 NOTE — Telephone Encounter (Signed)
Close encounter 

## 2020-10-20 ENCOUNTER — Other Ambulatory Visit: Payer: Self-pay

## 2020-10-20 ENCOUNTER — Telehealth: Payer: Self-pay | Admitting: Cardiology

## 2020-10-20 ENCOUNTER — Ambulatory Visit (HOSPITAL_COMMUNITY)
Admission: RE | Admit: 2020-10-20 | Discharge: 2020-10-20 | Disposition: A | Discharge status: Home / Self Care | Payer: Medicare Other | Source: Ambulatory Visit | Attending: Cardiology | Admitting: Cardiology

## 2020-10-20 DIAGNOSIS — Z01810 Encounter for preprocedural cardiovascular examination: Secondary | ICD-10-CM | POA: Insufficient documentation

## 2020-10-20 LAB — MYOCARDIAL PERFUSION IMAGING
LV dias vol: 134 mL (ref 62–150)
LV sys vol: 57 mL
Nuc Stress EF: 58 %
Peak HR: 100 {beats}/min
Rest HR: 77 {beats}/min
Rest Nuclear Isotope Dose: 10.8 mCi
SDS: 3
SRS: 0
SSS: 3
ST Depression (mm): 0 mm
Stress Nuclear Isotope Dose: 28 mCi
TID: 1.08

## 2020-10-20 MED ORDER — TECHNETIUM TC 99M TETROFOSMIN IV KIT
28.0000 | PACK | Freq: Once | INTRAVENOUS | Status: AC | PRN
  Administered 2020-10-20: 28 via INTRAVENOUS
  Filled 2020-10-20: qty 28

## 2020-10-20 MED ORDER — REGADENOSON 0.4 MG/5ML IV SOLN
0.4000 mg | Freq: Once | INTRAVENOUS | Status: AC
  Administered 2020-10-20: 0.4 mg via INTRAVENOUS

## 2020-10-20 MED ORDER — TECHNETIUM TC 99M TETROFOSMIN IV KIT
10.8000 | PACK | Freq: Once | INTRAVENOUS | Status: AC | PRN
  Administered 2020-10-20: 10.8 via INTRAVENOUS
  Filled 2020-10-20: qty 11

## 2020-10-20 NOTE — Telephone Encounter (Signed)
Pt is calling into the office, as advised by Dr. Harrell Gave, to inform her that he completed his stress test today. Pt states Dr. Harrell Gave wanted him to call and let her know once his test was complete, so she could review these results and get him cleared for his upcoming procedure.  Informed the pt that I will route this message to Dr. Harrell Gave and her RN to further review, advise, and follow-up with the pt thereafter.  Pt verbalized understanding and agrees with this plan.

## 2020-10-20 NOTE — Telephone Encounter (Signed)
[name] is calling stating Dr. Harrell Gave advised him to call in and inform her once his test has been completed so she can look over it.

## 2020-10-21 DIAGNOSIS — N2581 Secondary hyperparathyroidism of renal origin: Secondary | ICD-10-CM | POA: Diagnosis not present

## 2020-10-21 DIAGNOSIS — E875 Hyperkalemia: Secondary | ICD-10-CM | POA: Diagnosis not present

## 2020-10-21 DIAGNOSIS — D631 Anemia in chronic kidney disease: Secondary | ICD-10-CM | POA: Diagnosis not present

## 2020-10-21 DIAGNOSIS — D509 Iron deficiency anemia, unspecified: Secondary | ICD-10-CM | POA: Diagnosis not present

## 2020-10-21 DIAGNOSIS — R519 Headache, unspecified: Secondary | ICD-10-CM | POA: Diagnosis not present

## 2020-10-21 DIAGNOSIS — D689 Coagulation defect, unspecified: Secondary | ICD-10-CM | POA: Diagnosis not present

## 2020-10-21 DIAGNOSIS — Z992 Dependence on renal dialysis: Secondary | ICD-10-CM | POA: Diagnosis not present

## 2020-10-21 DIAGNOSIS — N186 End stage renal disease: Secondary | ICD-10-CM | POA: Diagnosis not present

## 2020-10-21 NOTE — Telephone Encounter (Signed)
Pt is reaching back out because he have not received a callback on 10/20/20. Please advise

## 2020-10-21 NOTE — Telephone Encounter (Signed)
Pt reaching out for results, didn't receive a callback yesterday... please advise

## 2020-10-21 NOTE — Telephone Encounter (Signed)
I spoke with patient and let him now study had not been reviewed by Dr Harrell Gave yet but that notes indicate it is a low risk study.  Patient aware he will be called after Dr Harrell Gave reviews.  Study was done for surgical clearance.  Patient is hoping to have surgery as soon as possible.

## 2020-10-24 ENCOUNTER — Ambulatory Visit: Payer: Medicare Other | Attending: Family Medicine | Admitting: Physical Therapy

## 2020-10-24 DIAGNOSIS — M25652 Stiffness of left hip, not elsewhere classified: Secondary | ICD-10-CM | POA: Insufficient documentation

## 2020-10-24 DIAGNOSIS — R519 Headache, unspecified: Secondary | ICD-10-CM | POA: Diagnosis not present

## 2020-10-24 DIAGNOSIS — E875 Hyperkalemia: Secondary | ICD-10-CM | POA: Diagnosis not present

## 2020-10-24 DIAGNOSIS — M6281 Muscle weakness (generalized): Secondary | ICD-10-CM | POA: Insufficient documentation

## 2020-10-24 DIAGNOSIS — Z992 Dependence on renal dialysis: Secondary | ICD-10-CM | POA: Diagnosis not present

## 2020-10-24 DIAGNOSIS — R2689 Other abnormalities of gait and mobility: Secondary | ICD-10-CM | POA: Insufficient documentation

## 2020-10-24 DIAGNOSIS — D689 Coagulation defect, unspecified: Secondary | ICD-10-CM | POA: Diagnosis not present

## 2020-10-24 DIAGNOSIS — D509 Iron deficiency anemia, unspecified: Secondary | ICD-10-CM | POA: Diagnosis not present

## 2020-10-24 DIAGNOSIS — M25552 Pain in left hip: Secondary | ICD-10-CM | POA: Insufficient documentation

## 2020-10-24 DIAGNOSIS — N186 End stage renal disease: Secondary | ICD-10-CM | POA: Diagnosis not present

## 2020-10-24 DIAGNOSIS — D631 Anemia in chronic kidney disease: Secondary | ICD-10-CM | POA: Diagnosis not present

## 2020-10-24 DIAGNOSIS — N2581 Secondary hyperparathyroidism of renal origin: Secondary | ICD-10-CM | POA: Diagnosis not present

## 2020-10-24 NOTE — Telephone Encounter (Signed)
MYOCARDIAL PERFUSION IMAGING: Result Notes  Carl Dresser, MD  10/22/2020  1:56 PM EDT     Low risk stress test, acceptable risk for surgery based on results.    The patient has been notified of the result and verbalized understanding.  All questions (if any) were answered.  Will send this result in pts cardiac clearance note, for pre-op team to finish up and route accordingly to the pts Orthopedic Surgeon Dr. Erlinda Hong.

## 2020-10-24 NOTE — Telephone Encounter (Signed)
MYOCARDIAL PERFUSION IMAGING: Result Notes  Buford Dresser, MD  10/22/2020  1:56 PM EDT     Low risk stress test, acceptable risk for surgery based on results.    The patient has been notified of the result and verbalized understanding.  All questions (if any) were answered.  Will send this result in pts cardiac clearance note, for pre-op team to finish up and route accordingly to the pts Orthopedic Surgeon Dr. Erlinda Hong.

## 2020-10-24 NOTE — Telephone Encounter (Signed)
[name] is calling requesting these results. Patient states he was advised he'd be called back on 09/09 and did not receive a call. I advised the pt this was due to Dr. Harrell Gave not reviewing the results until Saturday 10/22/20, but assured him he would be contacted today. Please advise.

## 2020-10-24 NOTE — Telephone Encounter (Signed)
Patient is cleared for surgery.  Thanks.

## 2020-10-25 ENCOUNTER — Telehealth: Payer: Self-pay | Admitting: Orthopaedic Surgery

## 2020-10-25 DIAGNOSIS — M1612 Unilateral primary osteoarthritis, left hip: Secondary | ICD-10-CM | POA: Diagnosis not present

## 2020-10-25 DIAGNOSIS — N186 End stage renal disease: Secondary | ICD-10-CM | POA: Diagnosis not present

## 2020-10-25 DIAGNOSIS — Z01818 Encounter for other preprocedural examination: Secondary | ICD-10-CM | POA: Diagnosis not present

## 2020-10-25 DIAGNOSIS — G63 Polyneuropathy in diseases classified elsewhere: Secondary | ICD-10-CM | POA: Diagnosis not present

## 2020-10-25 DIAGNOSIS — Z992 Dependence on renal dialysis: Secondary | ICD-10-CM | POA: Diagnosis not present

## 2020-10-25 NOTE — Telephone Encounter (Signed)
Patient called asked for a call back as soon as possible. Patient said he is in so much pain that he have not had a bowl movement in a couple days because it hurt to sit on the commode. Patient said he has been cleared for surgery and would like to have his surgery moved up. Patient said he may go to the emergency room he's hurting so bad. The number to contact patient is [phone]

## 2020-10-26 ENCOUNTER — Telehealth: Payer: Self-pay | Admitting: Orthopaedic Surgery

## 2020-10-26 DIAGNOSIS — R519 Headache, unspecified: Secondary | ICD-10-CM | POA: Diagnosis not present

## 2020-10-26 DIAGNOSIS — N186 End stage renal disease: Secondary | ICD-10-CM | POA: Diagnosis not present

## 2020-10-26 DIAGNOSIS — D509 Iron deficiency anemia, unspecified: Secondary | ICD-10-CM | POA: Diagnosis not present

## 2020-10-26 DIAGNOSIS — N2581 Secondary hyperparathyroidism of renal origin: Secondary | ICD-10-CM | POA: Diagnosis not present

## 2020-10-26 DIAGNOSIS — E875 Hyperkalemia: Secondary | ICD-10-CM | POA: Diagnosis not present

## 2020-10-26 DIAGNOSIS — Z992 Dependence on renal dialysis: Secondary | ICD-10-CM | POA: Diagnosis not present

## 2020-10-26 DIAGNOSIS — D631 Anemia in chronic kidney disease: Secondary | ICD-10-CM | POA: Diagnosis not present

## 2020-10-26 DIAGNOSIS — D689 Coagulation defect, unspecified: Secondary | ICD-10-CM | POA: Diagnosis not present

## 2020-10-26 NOTE — Telephone Encounter (Signed)
Pt called and states that he has been cleared for surgery. He would like a call from [place].   CB [phone]

## 2020-10-28 DIAGNOSIS — D631 Anemia in chronic kidney disease: Secondary | ICD-10-CM | POA: Diagnosis not present

## 2020-10-28 DIAGNOSIS — Z992 Dependence on renal dialysis: Secondary | ICD-10-CM | POA: Diagnosis not present

## 2020-10-28 DIAGNOSIS — M6281 Muscle weakness (generalized): Secondary | ICD-10-CM | POA: Diagnosis not present

## 2020-10-28 DIAGNOSIS — D689 Coagulation defect, unspecified: Secondary | ICD-10-CM | POA: Diagnosis not present

## 2020-10-28 DIAGNOSIS — M19011 Primary osteoarthritis, right shoulder: Secondary | ICD-10-CM | POA: Diagnosis not present

## 2020-10-28 DIAGNOSIS — N2581 Secondary hyperparathyroidism of renal origin: Secondary | ICD-10-CM | POA: Diagnosis not present

## 2020-10-28 DIAGNOSIS — E875 Hyperkalemia: Secondary | ICD-10-CM | POA: Diagnosis not present

## 2020-10-28 DIAGNOSIS — N186 End stage renal disease: Secondary | ICD-10-CM | POA: Diagnosis not present

## 2020-10-28 DIAGNOSIS — M25511 Pain in right shoulder: Secondary | ICD-10-CM | POA: Diagnosis not present

## 2020-10-28 DIAGNOSIS — R519 Headache, unspecified: Secondary | ICD-10-CM | POA: Diagnosis not present

## 2020-10-28 DIAGNOSIS — M25611 Stiffness of right shoulder, not elsewhere classified: Secondary | ICD-10-CM | POA: Diagnosis not present

## 2020-10-28 DIAGNOSIS — D509 Iron deficiency anemia, unspecified: Secondary | ICD-10-CM | POA: Diagnosis not present

## 2020-10-31 ENCOUNTER — Other Ambulatory Visit: Payer: Self-pay

## 2020-10-31 ENCOUNTER — Ambulatory Visit: Payer: Medicare Other | Admitting: Physical Therapy

## 2020-10-31 ENCOUNTER — Ambulatory Visit: Payer: Medicare Other

## 2020-10-31 DIAGNOSIS — M6281 Muscle weakness (generalized): Secondary | ICD-10-CM

## 2020-10-31 DIAGNOSIS — R2689 Other abnormalities of gait and mobility: Secondary | ICD-10-CM | POA: Diagnosis not present

## 2020-10-31 DIAGNOSIS — M25652 Stiffness of left hip, not elsewhere classified: Secondary | ICD-10-CM

## 2020-10-31 DIAGNOSIS — D631 Anemia in chronic kidney disease: Secondary | ICD-10-CM | POA: Diagnosis not present

## 2020-10-31 DIAGNOSIS — N186 End stage renal disease: Secondary | ICD-10-CM | POA: Diagnosis not present

## 2020-10-31 DIAGNOSIS — M25552 Pain in left hip: Secondary | ICD-10-CM | POA: Diagnosis not present

## 2020-10-31 DIAGNOSIS — E875 Hyperkalemia: Secondary | ICD-10-CM | POA: Diagnosis not present

## 2020-10-31 DIAGNOSIS — Z992 Dependence on renal dialysis: Secondary | ICD-10-CM | POA: Diagnosis not present

## 2020-10-31 DIAGNOSIS — Z23 Encounter for immunization: Secondary | ICD-10-CM | POA: Diagnosis not present

## 2020-10-31 DIAGNOSIS — N2581 Secondary hyperparathyroidism of renal origin: Secondary | ICD-10-CM | POA: Diagnosis not present

## 2020-10-31 DIAGNOSIS — R519 Headache, unspecified: Secondary | ICD-10-CM | POA: Diagnosis not present

## 2020-10-31 DIAGNOSIS — D509 Iron deficiency anemia, unspecified: Secondary | ICD-10-CM | POA: Diagnosis not present

## 2020-10-31 DIAGNOSIS — M1612 Unilateral primary osteoarthritis, left hip: Secondary | ICD-10-CM | POA: Diagnosis not present

## 2020-10-31 DIAGNOSIS — D689 Coagulation defect, unspecified: Secondary | ICD-10-CM | POA: Diagnosis not present

## 2020-10-31 NOTE — Patient Instructions (Signed)
Access Code: [email or web address] URL: [email or web address] Date: 10/31/2020 Prepared by: Claiborne Billings  Exercises Seated Long Arc Quad - 3 x daily - 7 x weekly - 2 sets - 5 reps - 3 hold Seated March - 3 x daily - 7 x weekly - 3 sets - 10 reps Seated Heel Toe Raises - 3 x daily - 7 x weekly - 2 sets - 10 reps Seated Heel Raise - 3 x daily - 7 x weekly - 2 sets - 10 reps Seated Isometric Hip Adduction with Ball - 3 x daily - 7 x weekly - 2 sets - 10 reps Sit to Stand - 1 x daily - 7 x weekly - 2 sets - 5 reps

## 2020-10-31 NOTE — Therapy (Signed)
[facility] [address], [place], [place], [address] Phone: [phone]   Fax:  [phone]  Physical Therapy Evaluation  Patient Details  Name: Carl Dung Sr. MRN: [id number] Date of Birth: December 14, 1960 Referring Provider (PT): [name], MD   Encounter Date: 10/31/2020   PT End of Session - 10/31/20 1135     Visit Number 1    Date for PT Re-Evaluation 11/28/20    Authorization Type UHC Medicare    PT Start Time 1101    PT Stop Time 1140    PT Time Calculation (min) 39 min    Activity Tolerance Patient tolerated treatment well    Behavior During Therapy WFL for tasks assessed/performed             Past Medical History:  Diagnosis Date   Acute gastric ulcer with hemorrhage    Acute GI bleeding 07/31/2019   Acute pericarditis    Anemia    hx of   Anxiety    situational    Arthritis    on meds   Borderline diabetes    Cataract    bilateral sx   Chronic headaches    Depression    situational    Diverticulitis    ESRD (end stage renal disease) ([place])     On Renal Transplant List," [facility]; MWF" (10/23/2016)   GERD (gastroesophageal reflux disease)    with certain foods   GI bleed    Hypertension    diet controlled   Parathyroid abnormality (HCC)    ectopic parathyroid gland   Presence of arteriovenous fistula for hemodialysis, primary ([place])    RUE   Refusal of blood product    NO WHOLE BLOOD PROUCTS   Renal cell carcinoma ([place])    s/p hand assisted laparoscopic bilateral nephrectomies 11/29/17, + RCC left   Secondary hyperparathyroidism ([place])    Seizures ([name])    one episode in past, due to" elevated Potassium" 08/02/20- "at least 4 years ago"   Sleep apnea    doesn't use CPAP anymore since weight loss   Stroke [facility])    no residual    Past Surgical History:  Procedure Laterality Date   AV FISTULA PLACEMENT Right    right arm   BIOPSY  08/01/2019   Procedure: BIOPSY;  Surgeon:  Juanita Craver, MD;  Location: [place];  Service: Endoscopy;;   CATARACT EXTRACTION W/ INTRAOCULAR LENS  IMPLANT, BILATERAL     COLON SURGERY     COLONOSCOPY N/A 08/04/2015   Procedure: COLONOSCOPY;  Surgeon: Jerene Bears, MD;  Location: [facility] ENDOSCOPY;  Service: Endoscopy;  Laterality: N/A;   COLONOSCOPY  2017   JMP@ [facility]-good prep-mass -recall 1 yr   ESOPHAGOGASTRODUODENOSCOPY N/A 08/01/2019   Procedure: ESOPHAGOGASTRODUODENOSCOPY (EGD);  Surgeon: Juanita Craver, MD;  Location: [facility] ENDOSCOPY;  Service: Endoscopy;  Laterality: N/A;   ESOPHAGOGASTRODUODENOSCOPY (EGD) WITH PROPOFOL N/A 08/04/2019   Procedure: ESOPHAGOGASTRODUODENOSCOPY (EGD) WITH PROPOFOL;  Surgeon: Yetta Flock, MD;  Location: [place];  Service: Gastroenterology;  Laterality: N/A;   graft left arm Left    for dialysis x 2. Removed   INSERTION OF DIALYSIS CATHETER     Rt chest   LAPAROSCOPIC RIGHT COLECTOMY N/A 08/05/2015   Procedure: LAPAROSCOPIC RIGHT COLECTOMY- ASCENDING;  Surgeon: Stark Klein, MD;  Location: [place];  Service: General;  Laterality: N/A;   MASS EXCISION Left 05/28/2019   Procedure: EXCISION SOFT TISSUE MASS LEFT SHOULDER;  Surgeon: Armandina Gemma, MD;  Location: [facility];  Service: General;  Laterality: Left;   NEPHRECTOMY Bilateral    PARATHYROIDECTOMY N/A 06/12/2016   Procedure: TOTAL PARATHYROIDECTOMY WITH AUTOTRANSPLANTATION TO LEFT FOREARM;  Surgeon: Armandina Gemma, MD;  Location: [place];  Service: General;  Laterality: N/A;   PARATHYROIDECTOMY N/A 10/23/2016   Procedure: PARATHYROIDECTOMY;  Surgeon: Armandina Gemma, MD;  Location: [place];  Service: General;  Laterality: N/A;   REVERSE SHOULDER ARTHROPLASTY Right 08/24/2020   Procedure: REVERSE SHOULDER ARTHROPLASTY;  Surgeon: Hiram Gash, MD;  Location: [place];  Service: Orthopedics;  Laterality: Right;   REVISON OF ARTERIOVENOUS FISTULA Right 07/16/2017   Procedure: REVISION OF ARTERIOVENOUS FISTULA  Right ARM;  Surgeon: Waynetta Sandy,  MD;  Location: [place];  Service: Vascular;  Laterality: Right;   UPPER GASTROINTESTINAL ENDOSCOPY  2021   @ [facility]    There were no vitals filed for this visit.    Subjective Assessment - 10/31/20 1112     Subjective Pt arrives at PT prior to THA that will be performed on 11/14/20.  Pt wants to keep as strong as he can leading to surgery.  Pt just had Rt total shoulder replacement last month and is attending therapy for this.    Pertinent History M,W,F-Dialysis, Rt total shoulder replacement (treated at another facility)    Limitations Standing;Walking    How long can you stand comfortably? 2-3 minutes max    How long can you walk comfortably? with 4 wheeled walker- short household distances    Diagnostic tests OA of Lt hip    Patient Stated Goals get stronger prior to surgery    Currently in Pain? Yes    Pain Score 8     Pain Location Hip    Pain Orientation Left    Pain Type Chronic pain    Pain Onset More than a month ago    Pain Frequency Constant    Aggravating Factors  standing and walking    Pain Relieving Factors sitting    Effect of Pain on Daily Activities limited gait and mobility                [facility] PT Assessment - 10/31/20 0001       Assessment   Medical Diagnosis primary OA of the Lt hip, leg weakness    Referring Provider (PT) Koirala, Dibas, MD    Onset Date/Surgical Date 10/22/17   chronic hip pain   Hand Dominance Right    Prior Therapy in PT for Rt shoulder s/p Rt total shoulder replacement last month      Precautions   Precautions Fall    Precaution Comments Rt UE 5-10#      Restrictions   Weight Bearing Restrictions No      Balance Screen   Has the patient fallen in the past 6 months No    Has the patient had a decrease in activity level because of a fear of falling?  No    Is the patient reluctant to leave their home because of a fear of falling?  No      Home Environment   Living Environment Assisted living    [place] - 4  wheels;Wheelchair - manual;Cane - single point      Prior Function   Level of Independence Independent with household mobility without device;Requires assistive device for independence    Vocation Retired      Associate Professor   Overall Cognitive Status Within Functional Limits for tasks assessed      Observation/Other Assessments  Focus on Therapeutic Outcomes (FOTO)  NA-gait and mobility      Posture/Postural Control   Posture/Postural Control Postural limitations    Postural Limitations Posterior pelvic tilt;Flexed trunk;Weight shift right;Forward head      ROM / Strength   AROM / PROM / Strength AROM;PROM;Strength      AROM   Overall AROM  Unable to assess    Overall AROM Comments Reduced strength and functional mobility with rolling walker      PROM   Overall PROM  Unable to assess      Strength   Overall Strength Deficits    Overall Strength Comments Lt hip flexion 3-/5, abduction/adduction in neutral 3+/5.    Strength Assessment Site Knee;Ankle    Right/Left Knee Right;Left    Right Knee Flexion 4/5    Right Knee Extension 4+/5    Left Knee Flexion 2+/5    Left Knee Extension 3-/5    Right/Left Ankle Right;Left    Right Ankle Dorsiflexion 4+/5    Left Ankle Dorsiflexion 4/5      Transfers   Transfers Stand to Sit;Sit to Stand    Sit to Stand With upper extremity assist;From elevated surface;5: Supervision    Sit to Stand Details Tactile cues for initiation;Verbal cues for technique    Stand to Sit 5: Supervision;With upper extremity assist;To elevated surface      Ambulation/Gait   Ambulation/Gait Yes    Ambulation/Gait Assistance 5: Supervision    Assistive device 4-wheeled walker    Gait Pattern Step-to pattern;Antalgic;Trendelenburg;Lateral hip instability;Decreased trunk rotation                        Objective measurements completed on examination: See above findings.                PT Education - 10/31/20 1134     Education  Details Access Code: [email or web address]    Person(s) Educated Patient;Child(ren)    Methods Explanation;Demonstration    Comprehension Verbalized understanding;Returned demonstration              PT Short Term Goals - 10/31/20 1101       PT SHORT TERM GOAL #1   Title --    Time --    Period --    Status --    Target Date --               PT Long Term Goals - 10/31/20 1101       PT LONG TERM GOAL #1   Title be independent in advanced HEP    Time 8    Period Weeks    Status New    Target Date 11/28/20      PT LONG TERM GOAL #2   Title improve LE strength to advance and move Lt LE with 25% less UE support    Time 4    Period Weeks    Status New    Target Date 11/28/20      PT LONG TERM GOAL #3   Title perform sit to stand with Lt UE support only to reduce stress on Rt UE    Time 4    Period Weeks    Status New    Target Date 11/28/20                    Plan - 10/31/20 1201     Clinical Impression Statement Pt presents with a complex medical history including end stage  renal disease, HTN, widespread OA and significant mobility issues.  Pt had Rt shoulder replacement last month and is active in rehab for this.  Pt will have Lt total hip replacement on 11/14/20 and would like to attend PT to improve activity and strength prior to surgery.  Pt with significant Lt LE weakness, gait abnormality and endurance deficits.  Max UE support required with supervision with sit to stand and pt was able to transition to use of Lt UE to protect Rt UE s/p total shoulder replacement.  PT initiated HEP for seated LE strength and to improve sit to stand transition.  Pt will attend PT x 2 weeks for exercise progression prior to total hip replacement on 11/14/20.    Personal Factors and Comorbidities Comorbidity 3+    Comorbidities ESRD with dialysis, HTN, CVA, chronic DJD that limits mobility    Examination-Activity Limitations Stand;Stairs;Squat;Locomotion Level;Transfers     Examination-Participation Restrictions Cleaning;Community Activity;Meal Prep    Stability/Clinical Decision Making Evolving/Moderate complexity    Clinical Decision Making Moderate    Rehab Potential Good    PT Frequency 2x / week    PT Duration 4 weeks    PT Treatment/Interventions ADLs/Self Care Home Management;Cryotherapy;Electrical Stimulation;Gait training;Stair training;Functional mobility training;Therapeutic activities;Therapeutic exercise;Balance training;Neuromuscular re-education;Manual techniques;Patient/family education;Passive range of motion    PT Next Visit Plan NuStep, work on sit to stand, review HEP    PT Home Exercise Plan Access Code: [email or web address]    Consulted and Agree with Plan of Care Patient             Patient will benefit from skilled therapeutic intervention in order to improve the following deficits and impairments:  Abnormal gait, Decreased activity tolerance, Postural dysfunction, Decreased mobility, Improper body mechanics, Impaired flexibility, Pain, Decreased endurance, Difficulty walking, Decreased range of motion  Visit Diagnosis: Pain in left hip - Plan: PT plan of care cert/re-cert  Other abnormalities of gait and mobility - Plan: PT plan of care cert/re-cert  Stiffness of left hip, not elsewhere classified - Plan: PT plan of care cert/re-cert  Muscle weakness (generalized)     Problem List Patient Active Problem List   Diagnosis Date Noted   Primary osteoarthritis of left hip 10/07/2020   Status post reverse total arthroplasty of right shoulder 08/24/2020   Diverticulitis 03/08/2020   ESRD on hemodialysis ([place]) 03/08/2020   Polyneuropathy in diseases classified elsewhere ([place]) 03/08/2020   Pure hypercholesterolemia 03/08/2020   Sacral back pain 03/08/2020   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 11/10/2019   Hypotension of hemodialysis 07/31/2019   Diverticulitis large intestine 07/31/2019   Mass of soft tissue of shoulder 05/26/2019    Awaiting organ transplant status 03/18/2019   Finding of cocaine in blood 02/25/2019   Anaphylactic shock, unspecified, sequela 11/21/2018   Hypercalcemia 10/28/2018   Diarrhea, unspecified 08/08/2018   Hyperkalemia 03/12/2018   Patient is Jehovah's Witness 01/30/2018   Renal cell carcinoma of left kidney ([place]) 12/24/2017   Fluid overload, unspecified 07/06/2017   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   Other specified disorders of kidney and ureter 11/22/2016   Secondary hyperparathyroidism of renal origin ([place]) 10/23/2016   Other headache syndrome 08/21/2016   TIA (transient ischemic attack)    Essential hypertension    Seizures ([place])    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    Lung nodule    Diverticulitis of cecum 07/21/2015   Diverticulitis of large intestine without perforation or abscess without bleeding    Right lower quadrant pain  Headache 05/05/2015   Obstructive sleep apnea 02/08/2015   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   Diverticulosis 07/13/2014   Lower GI bleed 07/12/2014   Refusal of blood transfusions as patient is Jehovah's Witness    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   HLD (hyperlipidemia)    PRES (posterior reversible encephalopathy syndrome)    History of lacunar cerebrovascular accident (CVA) 05/23/2014   Cerebral infarction ([place]) 05/23/2014   Anemia of renal disease 05/23/2014   Shortness of breath 01/15/2013   GERD (gastroesophageal reflux disease) 12/09/2012   Gastrointestinal bleeding 11/12/2012   Melena 11/12/2012   Other psychoactive substance dependence, uncomplicated ([place]) 41/28/7867   Iron deficiency anemia, unspecified 06/10/2012   Moderate protein-calorie malnutrition ([place]) 07/29/2009   Coagulation defect, unspecified ([place]) 08/29/2006   Type 2 diabetes mellitus with diabetic peripheral angiopathy without gangrene ([place]) 08/29/2006   Hypertensive  chronic kidney disease with stage 5 chronic kidney disease or end stage renal disease ([place]) 08/02/2006   [name], PT 10/31/20 12:08 PM   [facility] [address] [place], [place], [address] Phone: [phone]   Fax:  [phone]  Name: Carl Esquivel Sr. MRN: [id number] Date of Birth: 1961/01/10

## 2020-11-02 DIAGNOSIS — E875 Hyperkalemia: Secondary | ICD-10-CM | POA: Diagnosis not present

## 2020-11-02 DIAGNOSIS — D631 Anemia in chronic kidney disease: Secondary | ICD-10-CM | POA: Diagnosis not present

## 2020-11-02 DIAGNOSIS — R519 Headache, unspecified: Secondary | ICD-10-CM | POA: Diagnosis not present

## 2020-11-02 DIAGNOSIS — D509 Iron deficiency anemia, unspecified: Secondary | ICD-10-CM | POA: Diagnosis not present

## 2020-11-02 DIAGNOSIS — Z992 Dependence on renal dialysis: Secondary | ICD-10-CM | POA: Diagnosis not present

## 2020-11-02 DIAGNOSIS — N2581 Secondary hyperparathyroidism of renal origin: Secondary | ICD-10-CM | POA: Diagnosis not present

## 2020-11-02 DIAGNOSIS — N186 End stage renal disease: Secondary | ICD-10-CM | POA: Diagnosis not present

## 2020-11-02 DIAGNOSIS — D689 Coagulation defect, unspecified: Secondary | ICD-10-CM | POA: Diagnosis not present

## 2020-11-03 ENCOUNTER — Other Ambulatory Visit: Payer: Self-pay

## 2020-11-04 ENCOUNTER — Ambulatory Visit: Payer: Medicare Other | Admitting: Physical Therapy

## 2020-11-04 DIAGNOSIS — N2581 Secondary hyperparathyroidism of renal origin: Secondary | ICD-10-CM | POA: Diagnosis not present

## 2020-11-04 DIAGNOSIS — D689 Coagulation defect, unspecified: Secondary | ICD-10-CM | POA: Diagnosis not present

## 2020-11-04 DIAGNOSIS — Z992 Dependence on renal dialysis: Secondary | ICD-10-CM | POA: Diagnosis not present

## 2020-11-04 DIAGNOSIS — E875 Hyperkalemia: Secondary | ICD-10-CM | POA: Diagnosis not present

## 2020-11-04 DIAGNOSIS — D509 Iron deficiency anemia, unspecified: Secondary | ICD-10-CM | POA: Diagnosis not present

## 2020-11-04 DIAGNOSIS — R519 Headache, unspecified: Secondary | ICD-10-CM | POA: Diagnosis not present

## 2020-11-04 DIAGNOSIS — N186 End stage renal disease: Secondary | ICD-10-CM | POA: Diagnosis not present

## 2020-11-04 DIAGNOSIS — D631 Anemia in chronic kidney disease: Secondary | ICD-10-CM | POA: Diagnosis not present

## 2020-11-07 ENCOUNTER — Ambulatory Visit: Payer: Medicare Other | Admitting: Physical Therapy

## 2020-11-07 ENCOUNTER — Other Ambulatory Visit: Payer: Self-pay

## 2020-11-07 ENCOUNTER — Telehealth: Payer: Self-pay | Admitting: Physical Therapy

## 2020-11-07 DIAGNOSIS — Z992 Dependence on renal dialysis: Secondary | ICD-10-CM | POA: Diagnosis not present

## 2020-11-07 DIAGNOSIS — M6281 Muscle weakness (generalized): Secondary | ICD-10-CM | POA: Diagnosis not present

## 2020-11-07 DIAGNOSIS — R2689 Other abnormalities of gait and mobility: Secondary | ICD-10-CM | POA: Diagnosis not present

## 2020-11-07 DIAGNOSIS — D509 Iron deficiency anemia, unspecified: Secondary | ICD-10-CM | POA: Diagnosis not present

## 2020-11-07 DIAGNOSIS — M25652 Stiffness of left hip, not elsewhere classified: Secondary | ICD-10-CM | POA: Diagnosis not present

## 2020-11-07 DIAGNOSIS — R519 Headache, unspecified: Secondary | ICD-10-CM | POA: Diagnosis not present

## 2020-11-07 DIAGNOSIS — M25552 Pain in left hip: Secondary | ICD-10-CM | POA: Diagnosis not present

## 2020-11-07 DIAGNOSIS — N186 End stage renal disease: Secondary | ICD-10-CM | POA: Diagnosis not present

## 2020-11-07 DIAGNOSIS — M25511 Pain in right shoulder: Secondary | ICD-10-CM | POA: Diagnosis not present

## 2020-11-07 DIAGNOSIS — E875 Hyperkalemia: Secondary | ICD-10-CM | POA: Diagnosis not present

## 2020-11-07 DIAGNOSIS — D689 Coagulation defect, unspecified: Secondary | ICD-10-CM | POA: Diagnosis not present

## 2020-11-07 DIAGNOSIS — N2581 Secondary hyperparathyroidism of renal origin: Secondary | ICD-10-CM | POA: Diagnosis not present

## 2020-11-07 DIAGNOSIS — M25611 Stiffness of right shoulder, not elsewhere classified: Secondary | ICD-10-CM | POA: Diagnosis not present

## 2020-11-07 DIAGNOSIS — D631 Anemia in chronic kidney disease: Secondary | ICD-10-CM | POA: Diagnosis not present

## 2020-11-07 DIAGNOSIS — M19011 Primary osteoarthritis, right shoulder: Secondary | ICD-10-CM | POA: Diagnosis not present

## 2020-11-07 NOTE — Telephone Encounter (Signed)
PTA called pt regarding his last appt. LM to call our office.  Myrene Galas, PTA @TODAY @ 4:19 PM

## 2020-11-07 NOTE — Therapy (Signed)
[facility] [address] [place], [place], [address] Phone: [phone]   Fax:  [phone]  Physical Therapy Treatment  Patient Details  Name: Carl Sam Sr. MRN: [id number] Date of Birth: [birth year] Referring Provider (PT): [name], MD   Encounter Date: 11/07/2020   PT End of Session - 11/07/20 1039     Visit Number 2    Date for PT Re-Evaluation 11/28/20    Authorization Type UHC Medicare    PT Start Time 1038   pt late secondary to dialysis   PT Stop Time 1058    PT Time Calculation (min) 20 min    Activity Tolerance Patient limited by pain    Behavior During Therapy [facility] for tasks assessed/performed             Past Medical History:  Diagnosis Date   Acute gastric ulcer with hemorrhage    Acute GI bleeding 07/31/2019   Acute pericarditis    Anemia    hx of   Anxiety    situational    Arthritis    on meds   Borderline diabetes    Cataract    bilateral sx   Chronic headaches    Depression    situational    Diverticulitis    ESRD (end stage renal disease) ([place])     On Renal Transplant List," [facility]; MWF" (10/23/2016)   GERD (gastroesophageal reflux disease)    with certain foods   GI bleed    Hypertension    diet controlled   Parathyroid abnormality (HCC)    ectopic parathyroid gland   Presence of arteriovenous fistula for hemodialysis, primary ([place])    RUE   Refusal of blood product    NO WHOLE BLOOD PROUCTS   Renal cell carcinoma ([place])    s/p hand assisted laparoscopic bilateral nephrectomies 11/29/17, + RCC left   Secondary hyperparathyroidism ([place])    Seizures ([place])    one episode in past, due to" elevated Potassium" 08/02/20- "at least 4 years ago"   Sleep apnea    doesn't use CPAP anymore since weight loss   Stroke [facility])    no residual    Past Surgical History:  Procedure Laterality Date   AV FISTULA PLACEMENT Right    right arm   BIOPSY  08/01/2019    Procedure: BIOPSY;  Surgeon: Juanita Craver, MD;  Location: [place];  Service: Endoscopy;;   CATARACT EXTRACTION W/ INTRAOCULAR LENS  IMPLANT, BILATERAL     COLON SURGERY     COLONOSCOPY N/A 08/04/2015   Procedure: COLONOSCOPY;  Surgeon: Jerene Bears, MD;  Location: [facility] ENDOSCOPY;  Service: Endoscopy;  Laterality: N/A;   COLONOSCOPY  2017   JMP@ [facility]-good prep-mass -recall 1 yr   ESOPHAGOGASTRODUODENOSCOPY N/A 08/01/2019   Procedure: ESOPHAGOGASTRODUODENOSCOPY (EGD);  Surgeon: Juanita Craver, MD;  Location: [facility] ENDOSCOPY;  Service: Endoscopy;  Laterality: N/A;   ESOPHAGOGASTRODUODENOSCOPY (EGD) WITH PROPOFOL N/A 08/04/2019   Procedure: ESOPHAGOGASTRODUODENOSCOPY (EGD) WITH PROPOFOL;  Surgeon: Yetta Flock, MD;  Location: [place];  Service: Gastroenterology;  Laterality: N/A;   graft left arm Left    for dialysis x 2. Removed   INSERTION OF DIALYSIS CATHETER     Rt chest   LAPAROSCOPIC RIGHT COLECTOMY N/A 08/05/2015   Procedure: LAPAROSCOPIC RIGHT COLECTOMY- ASCENDING;  Surgeon: Stark Klein, MD;  Location: [place];  Service: General;  Laterality: N/A;   MASS EXCISION Left 05/28/2019   Procedure: EXCISION SOFT TISSUE MASS LEFT SHOULDER;  Surgeon: Harlow Asa,  Sherren Mocha, MD;  Location: [facility];  Service: General;  Laterality: Left;   NEPHRECTOMY Bilateral    PARATHYROIDECTOMY N/A 06/12/2016   Procedure: TOTAL PARATHYROIDECTOMY WITH AUTOTRANSPLANTATION TO LEFT FOREARM;  Surgeon: Armandina Gemma, MD;  Location: [place];  Service: General;  Laterality: N/A;   PARATHYROIDECTOMY N/A 10/23/2016   Procedure: PARATHYROIDECTOMY;  Surgeon: Armandina Gemma, MD;  Location: [place];  Service: General;  Laterality: N/A;   REVERSE SHOULDER ARTHROPLASTY Right 08/24/2020   Procedure: REVERSE SHOULDER ARTHROPLASTY;  Surgeon: Hiram Gash, MD;  Location: [place];  Service: Orthopedics;  Laterality: Right;   REVISON OF ARTERIOVENOUS FISTULA Right 07/16/2017   Procedure: REVISION OF ARTERIOVENOUS FISTULA  Right ARM;  Surgeon:  Waynetta Sandy, MD;  Location: [place];  Service: Vascular;  Laterality: Right;   UPPER GASTROINTESTINAL ENDOSCOPY  2021   @ [facility]    There were no vitals filed for this visit.   Subjective Assessment - 11/07/20 1040     Currently in Pain? Yes   Lt hip, buttocks, knee Rt shoulder   Pain Orientation Left    Pain Descriptors / Indicators Sharp    Aggravating Factors  standing and walking    Pain Relieving Factors sitting    Multiple Pain Sites No                               [facility] Adult PT Treatment/Exercise - 11/07/20 0001       Knee/Hip Exercises: Aerobic   Nustep L1 x3 min      Knee/Hip Exercises: Seated   Long Arc Quad AROM;Right;1 set;10 reps   Tried LTLE but could not   Heel Slides --   Heel lifts 20x, toe lifts 20x   Ball Squeeze 20x painful                       PT Short Term Goals - 10/31/20 1101       PT SHORT TERM GOAL #1   Title --    Time --    Period --    Status --    Target Date --               PT Long Term Goals - 10/31/20 1101       PT LONG TERM GOAL #1   Title be independent in advanced HEP    Time 8    Period Weeks    Status New    Target Date 11/28/20      PT LONG TERM GOAL #2   Title improve LE strength to advance and move Lt LE with 25% less UE support    Time 4    Period Weeks    Status New    Target Date 11/28/20      PT LONG TERM GOAL #3   Title perform sit to stand with Lt UE support only to reduce stress on Rt UE    Time 4    Period Weeks    Status New    Target Date 11/28/20                   Plan - 11/07/20 1041     Clinical Impression Statement Pt comes from dialysis and will be significantly late for each appt. Pt is scheduled for hip replacement next Monday and is severely limited in all mobility. His left knee also needs replacing. Pt does not tolerate much  exercise and is super slow to move. Lt knee and hip pain limited Nustep. Pt reports doing "a little  HEP, but I gotta walk and that's my exercise."    Personal Factors and Comorbidities Comorbidity 3+    Comorbidities ESRD with dialysis, HTN, CVA, chronic DJD that limits mobility    Examination-Activity Limitations Stand;Stairs;Squat;Locomotion Level;Transfers    Examination-Participation Restrictions Cleaning;Community Activity;Meal Prep    Stability/Clinical Decision Making Evolving/Moderate complexity    Rehab Potential Good    PT Frequency 2x / week    PT Duration 4 weeks    PT Treatment/Interventions ADLs/Self Care Home Management;Cryotherapy;Electrical Stimulation;Gait training;Stair training;Functional mobility training;Therapeutic activities;Therapeutic exercise;Balance training;Neuromuscular re-education;Manual techniques;Patient/family education;Passive range of motion    PT Next Visit Plan DC next visit.    PT Home Exercise Plan Access Code: [email or web address]    Consulted and Agree with Plan of Care Patient             Patient will benefit from skilled therapeutic intervention in order to improve the following deficits and impairments:  Abnormal gait, Decreased activity tolerance, Postural dysfunction, Decreased mobility, Improper body mechanics, Impaired flexibility, Pain, Decreased endurance, Difficulty walking, Decreased range of motion  Visit Diagnosis: Pain in left hip  Stiffness of left hip, not elsewhere classified  Other abnormalities of gait and mobility  Muscle weakness (generalized)     Problem List Patient Active Problem List   Diagnosis Date Noted   Primary osteoarthritis of left hip 10/07/2020   Status post reverse total arthroplasty of right shoulder 08/24/2020   Diverticulitis 03/08/2020   ESRD on hemodialysis ([place]) 03/08/2020   Polyneuropathy in diseases classified elsewhere ([place]) 03/08/2020   Pure hypercholesterolemia 03/08/2020   Sacral back pain 03/08/2020   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 11/10/2019   Hypotension of hemodialysis 07/31/2019    Diverticulitis large intestine 07/31/2019   Mass of soft tissue of shoulder 05/26/2019   Awaiting organ transplant status 03/18/2019   Finding of cocaine in blood 02/25/2019   Anaphylactic shock, unspecified, sequela 11/21/2018   Hypercalcemia 10/28/2018   Diarrhea, unspecified 08/08/2018   Hyperkalemia 03/12/2018   Patient is Jehovah's Witness 01/30/2018   Renal cell carcinoma of left kidney ([place]) 12/24/2017   Fluid overload, unspecified 07/06/2017   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   Other specified disorders of kidney and ureter 11/22/2016   Secondary hyperparathyroidism of renal origin ([place]) 10/23/2016   Other headache syndrome 08/21/2016   TIA (transient ischemic attack)    Essential hypertension    Seizures ([place])    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    Lung nodule    Diverticulitis of cecum 07/21/2015   Diverticulitis of large intestine without perforation or abscess without bleeding    Right lower quadrant pain    Headache 05/05/2015   Obstructive sleep apnea 02/08/2015   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   Diverticulosis 07/13/2014   Lower GI bleed 07/12/2014   Refusal of blood transfusions as patient is Jehovah's Witness    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   HLD (hyperlipidemia)    PRES (posterior reversible encephalopathy syndrome)    History of lacunar cerebrovascular accident (CVA) 05/23/2014   Cerebral infarction ([place]) 05/23/2014   Anemia of renal disease 05/23/2014   Shortness of breath 01/15/2013   GERD (gastroesophageal reflux disease) 12/09/2012   Gastrointestinal bleeding 11/12/2012   Melena 11/12/2012   Other psychoactive substance dependence, uncomplicated ([place]) 83/15/1761   Iron deficiency anemia, unspecified 06/10/2012  Moderate protein-calorie malnutrition ([place]) 07/29/2009   Coagulation defect, unspecified ([place]) 08/29/2006   Type 2 diabetes mellitus  with diabetic peripheral angiopathy without gangrene ([place]) 08/29/2006   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 stage renal disease ([place]) 08/02/2006    Tauren Delbuono, PTA 11/07/2020, 4:07 PM  [facility] [address], [place], [place], [address] Phone: [phone]   Fax:  [phone]  Name: Drakkar Medeiros Sr. MRN: [id number] Date of Birth: Jun 14, 1960

## 2020-11-08 ENCOUNTER — Other Ambulatory Visit: Payer: Self-pay | Admitting: Physician Assistant

## 2020-11-08 ENCOUNTER — Telehealth: Payer: Self-pay | Admitting: Orthopaedic Surgery

## 2020-11-08 MED ORDER — CEPHALEXIN 500 MG PO CAPS: 500.0000 mg | ORAL_CAPSULE | Freq: Four times a day (QID) | ORAL | 0 refills | Status: DC

## 2020-11-08 MED ORDER — DOCUSATE SODIUM 100 MG PO CAPS: 100.0000 mg | ORAL_CAPSULE | Freq: Every day | ORAL | 2 refills | Status: AC | PRN

## 2020-11-08 MED ORDER — ONDANSETRON HCL 4 MG PO TABS: 4.0000 mg | ORAL_TABLET | Freq: Three times a day (TID) | ORAL | 0 refills | Status: DC | PRN

## 2020-11-08 MED ORDER — ASPIRIN EC 81 MG PO TBEC: 81.0000 mg | DELAYED_RELEASE_TABLET | Freq: Two times a day (BID) | ORAL | 0 refills | Status: DC

## 2020-11-08 MED ORDER — METHOCARBAMOL 500 MG PO TABS: 500.0000 mg | ORAL_TABLET | Freq: Two times a day (BID) | ORAL | 2 refills | Status: DC | PRN

## 2020-11-08 MED ORDER — OXYCODONE-ACETAMINOPHEN 5-325 MG PO TABS: 1.0000 | ORAL_TABLET | Freq: Four times a day (QID) | ORAL | 0 refills | Status: DC | PRN

## 2020-11-08 NOTE — Telephone Encounter (Signed)
Pt called stating he has some questions about how things are going to go after his surgery on 11/14/20 and he would like a CB to discuss it in detail.   [phone]

## 2020-11-08 NOTE — Telephone Encounter (Signed)
Surgery will be roughly 90 minutes.  Our plan is not to go to a rehab facility if he clears PT and can go home.

## 2020-11-08 NOTE — Telephone Encounter (Signed)
Patient called asked how long will he be in surgery? Patient also want to know which rehab facility will he be going to? Patient asked how long will he be in rehab? The number to contact patient is [phone]

## 2020-11-09 ENCOUNTER — Encounter: Payer: Self-pay | Admitting: Physical Therapy

## 2020-11-09 ENCOUNTER — Other Ambulatory Visit: Payer: Self-pay

## 2020-11-09 ENCOUNTER — Ambulatory Visit: Payer: Medicare Other | Admitting: Physical Therapy

## 2020-11-09 DIAGNOSIS — R2689 Other abnormalities of gait and mobility: Secondary | ICD-10-CM

## 2020-11-09 DIAGNOSIS — D689 Coagulation defect, unspecified: Secondary | ICD-10-CM | POA: Diagnosis not present

## 2020-11-09 DIAGNOSIS — D509 Iron deficiency anemia, unspecified: Secondary | ICD-10-CM | POA: Diagnosis not present

## 2020-11-09 DIAGNOSIS — D631 Anemia in chronic kidney disease: Secondary | ICD-10-CM | POA: Diagnosis not present

## 2020-11-09 DIAGNOSIS — M6281 Muscle weakness (generalized): Secondary | ICD-10-CM

## 2020-11-09 DIAGNOSIS — M25552 Pain in left hip: Secondary | ICD-10-CM | POA: Diagnosis not present

## 2020-11-09 DIAGNOSIS — N2581 Secondary hyperparathyroidism of renal origin: Secondary | ICD-10-CM | POA: Diagnosis not present

## 2020-11-09 DIAGNOSIS — E875 Hyperkalemia: Secondary | ICD-10-CM | POA: Diagnosis not present

## 2020-11-09 DIAGNOSIS — N186 End stage renal disease: Secondary | ICD-10-CM | POA: Diagnosis not present

## 2020-11-09 DIAGNOSIS — R519 Headache, unspecified: Secondary | ICD-10-CM | POA: Diagnosis not present

## 2020-11-09 DIAGNOSIS — Z992 Dependence on renal dialysis: Secondary | ICD-10-CM | POA: Diagnosis not present

## 2020-11-09 DIAGNOSIS — M25652 Stiffness of left hip, not elsewhere classified: Secondary | ICD-10-CM | POA: Diagnosis not present

## 2020-11-09 NOTE — Progress Notes (Signed)
Surgical Instructions    Your procedure is scheduled on Monday October 3rd.  Report to [facility] Main Entrance "A" at 1:20pm., then check in with the Admitting office.  Call this number if you have problems the morning of surgery:  [phone]   If you have any questions prior to your surgery date call [phone]: Open Monday-Friday 8am-4pm    Remember:  Do not eat after midnight the night before your surgery  You may drink clear liquids until 12:20pm the afternoon of your surgery.   Clear liquids allowed are: Water, Non-Citrus Juices (without pulp), Carbonated Beverages, Clear Tea, Black Coffee ONLY (NO MILK, CREAM OR POWDERED CREAMER of any kind), and Gatorade   Enhanced Recovery after Surgery for Orthopedics Enhanced Recovery after Surgery is a protocol used to improve the stress on your body and your recovery after surgery.  Patient Instructions  The day of surgery (if you do NOT have diabetes):  Drink ONE (1) Pre-Surgery Clear Ensure by __12:20___ pm the morning of surgery   This drink was given to you during your hospital  pre-op appointment visit. Nothing else to drink after completing the  Pre-Surgery Clear Ensure.          If you have questions, please contact your surgeon's office.     Take these medicines the morning of surgery with A SIP OF WATER atorvastatin (LIPITOR) 10 MG tablet cinacalcet (SENSIPAR) 60 MG tablet gabapentin (NEURONTIN) 100 MG capsule omeprazole (PRILOSEC) 20 MG capsule topiramate (TOPAMAX) 25 MG tablet  IF NEEDED  cromolyn (OPTICROM) 4 % ophthalmic solution HYDROcodone-acetaminophen (NORCO/VICODIN) 5-325 MG tablet ondansetron (ZOFRAN) 4 MG tablet oxyCODONE (OXY IR/ROXICODONE) 5 MG immediate release tablet   Follow your surgeon's instructions on when to stop Aspirin.  If no instructions were given by your surgeon then you will need to call the office to get those instructions.     As of today, STOP taking any Aspirin (unless  otherwise instructed by your surgeon) Aleve, Naproxen, Ibuprofen, Motrin, Advil, Goody's, BC's, all herbal medications, fish oil, and all vitamins.          Do not wear jewelry Do not wear lotions, powders, colognes, or deodorant. Do not shave 48 hours prior to surgery.  Men may shave face and neck. Do not bring valuables to the hospital. DO Not wear nail polish, gel polish, artificial nails, or any other type of covering on natural nails including finger and toenails. If patients have artificial nails, gel coating, etc. that need to be removed by a nail salon please have this removed prior to surgery or surgery may need to be canceled/delayed if the surgeon/ anesthesia feels like the patient is unable to be adequately monitored.              is not responsible for any belongings or valuables.  Do NOT Smoke (Tobacco/Vaping)  24 hours prior to your procedure If you use a CPAP at night, you may bring your mask for your overnight stay.   Contacts, glasses, dentures or bridgework may not be worn into surgery, please bring cases for these belongings   For patients admitted to the hospital, discharge time will be determined by your treatment team.   Patients discharged the day of surgery will not be allowed to drive home, and someone needs to stay with them for 24 hours.  NO VISITORS WILL BE ALLOWED IN PRE-OP WHERE PATIENTS GET READY FOR SURGERY.  ONLY 1 SUPPORT PERSON MAY BE PRESENT IN THE WAITING ROOM WHILE  YOU ARE IN SURGERY.  IF YOU ARE TO BE ADMITTED, ONCE YOU ARE IN YOUR ROOM YOU WILL BE ALLOWED TWO (2) VISITORS.  Minor children may have two parents present. Special consideration for safety and communication needs will be reviewed on a case by case basis.  Special instructions:    Oral Hygiene is also important to reduce your risk of infection.  Remember - BRUSH YOUR TEETH THE MORNING OF SURGERY WITH YOUR REGULAR TOOTHPASTE   [place]- Preparing For Surgery  Before surgery,  you can play an important role. Because skin is not sterile, your skin needs to be as free of germs as possible. You can reduce the number of germs on your skin by washing with CHG (chlorahexidine gluconate) Soap before surgery.  CHG is an antiseptic cleaner which kills germs and bonds with the skin to continue killing germs even after washing.     Please do not use if you have an allergy to CHG or antibacterial soaps. If your skin becomes reddened/irritated stop using the CHG.  Do not shave (including legs and underarms) for at least 48 hours prior to first CHG shower. It is OK to shave your face.  Please follow these instructions carefully.     Shower the NIGHT BEFORE SURGERY and the MORNING OF SURGERY with CHG Soap.   If you chose to wash your hair, wash your hair first as usual with your normal shampoo. After you shampoo, rinse your hair and body thoroughly to remove the shampoo.  Then [employer] and genitals (private parts) with your normal soap and rinse thoroughly to remove soap.  After that Use CHG Soap as you would any other liquid soap. You can apply CHG directly to the skin and wash gently with a scrungie or a clean washcloth.   Apply the CHG Soap to your body ONLY FROM THE NECK DOWN.  Do not use on open wounds or open sores. Avoid contact with your eyes, ears, mouth and genitals (private parts). Wash Face and genitals (private parts)  with your normal soap.   Wash thoroughly, paying special attention to the area where your surgery will be performed.  Thoroughly rinse your body with warm water from the neck down.  DO NOT shower/wash with your normal soap after using and rinsing off the CHG Soap.  Pat yourself dry with a CLEAN TOWEL.  Wear CLEAN PAJAMAS to bed the night before surgery  Place CLEAN SHEETS on your bed the night before your surgery  DO NOT SLEEP WITH PETS.   Day of Surgery:  Take a shower with CHG soap. Wear Clean/Comfortable clothing the morning of  surgery Do not apply any deodorants/lotions.   Remember to brush your teeth WITH YOUR REGULAR TOOTHPASTE.   Please read over the following fact sheets that you were given.

## 2020-11-09 NOTE — Telephone Encounter (Signed)
Spoke to patient and advised. States she would like to speak to [place].

## 2020-11-09 NOTE — Therapy (Signed)
[facility] [address] [place], [place], [address] Phone: [phone]   Fax:  [phone]  Physical Therapy Treatment/Discharge  Patient Details  Name: Carl Birr Sr. MRN: [id number] Date of Birth: 09-09-1960 Referring Provider (PT): [name], MD   Encounter Date: 11/09/2020   PT End of Session - 11/09/20 1248     Visit Number 3    Date for PT Re-Evaluation 11/28/20    Authorization Type [employer] Medicare    PT Start Time 1102    PT Stop Time 2094    PT Time Calculation (min) 43 min    Activity Tolerance Patient limited by pain    Behavior During Therapy [facility] for tasks assessed/performed             Past Medical History:  Diagnosis Date   Acute gastric ulcer with hemorrhage    Acute GI bleeding 07/31/2019   Acute pericarditis    Anemia    hx of   Anxiety    situational    Arthritis    on meds   Borderline diabetes    Cataract    bilateral sx   Chronic headaches    Depression    situational    Diverticulitis    ESRD (end stage renal disease) ([place])     On Renal Transplant List," [facility]; MWF" (10/23/2016)   GERD (gastroesophageal reflux disease)    with certain foods   GI bleed    Hypertension    diet controlled   Parathyroid abnormality (HCC)    ectopic parathyroid gland   Presence of arteriovenous fistula for hemodialysis, primary ([place])    RUE   Refusal of blood product    NO WHOLE BLOOD PROUCTS   Renal cell carcinoma ([place])    s/p hand assisted laparoscopic bilateral nephrectomies 11/29/17, + RCC left   Secondary hyperparathyroidism ([place])    Seizures ([place])    one episode in past, due to" elevated Potassium" 08/02/20- "at least 4 years ago"   Sleep apnea    doesn't use CPAP anymore since weight loss   Stroke [facility])    no residual    Past Surgical History:  Procedure Laterality Date   AV FISTULA PLACEMENT Right    right arm   BIOPSY  08/01/2019   Procedure: BIOPSY;  Surgeon:  Juanita Craver, MD;  Location: [place];  Service: Endoscopy;;   CATARACT EXTRACTION W/ INTRAOCULAR LENS  IMPLANT, BILATERAL     COLON SURGERY     COLONOSCOPY N/A 08/04/2015   Procedure: COLONOSCOPY;  Surgeon: Jerene Bears, MD;  Location: [facility] ENDOSCOPY;  Service: Endoscopy;  Laterality: N/A;   COLONOSCOPY  2017   JMP@ [facility]-good prep-mass -recall 1 yr   ESOPHAGOGASTRODUODENOSCOPY N/A 08/01/2019   Procedure: ESOPHAGOGASTRODUODENOSCOPY (EGD);  Surgeon: Juanita Craver, MD;  Location: [facility] ENDOSCOPY;  Service: Endoscopy;  Laterality: N/A;   ESOPHAGOGASTRODUODENOSCOPY (EGD) WITH PROPOFOL N/A 08/04/2019   Procedure: ESOPHAGOGASTRODUODENOSCOPY (EGD) WITH PROPOFOL;  Surgeon: Yetta Flock, MD;  Location: [place];  Service: Gastroenterology;  Laterality: N/A;   graft left arm Left    for dialysis x 2. Removed   INSERTION OF DIALYSIS CATHETER     Rt chest   LAPAROSCOPIC RIGHT COLECTOMY N/A 08/05/2015   Procedure: LAPAROSCOPIC RIGHT COLECTOMY- ASCENDING;  Surgeon: Stark Klein, MD;  Location: [place];  Service: General;  Laterality: N/A;   MASS EXCISION Left 05/28/2019   Procedure: EXCISION SOFT TISSUE MASS LEFT SHOULDER;  Surgeon: Armandina Gemma, MD;  Location: [facility];  Service: General;  Laterality: Left;   NEPHRECTOMY Bilateral    PARATHYROIDECTOMY N/A 06/12/2016   Procedure: TOTAL PARATHYROIDECTOMY WITH AUTOTRANSPLANTATION TO LEFT FOREARM;  Surgeon: Armandina Gemma, MD;  Location: [place];  Service: General;  Laterality: N/A;   PARATHYROIDECTOMY N/A 10/23/2016   Procedure: PARATHYROIDECTOMY;  Surgeon: Armandina Gemma, MD;  Location: [place];  Service: General;  Laterality: N/A;   REVERSE SHOULDER ARTHROPLASTY Right 08/24/2020   Procedure: REVERSE SHOULDER ARTHROPLASTY;  Surgeon: Hiram Gash, MD;  Location: [place];  Service: Orthopedics;  Laterality: Right;   REVISON OF ARTERIOVENOUS FISTULA Right 07/16/2017   Procedure: REVISION OF ARTERIOVENOUS FISTULA  Right ARM;  Surgeon: Waynetta Sandy,  MD;  Location: [place];  Service: Vascular;  Laterality: Right;   UPPER GASTROINTESTINAL ENDOSCOPY  2021   @ [facility]    There were no vitals filed for this visit.   Subjective Assessment - 11/09/20 1113     Subjective Pt states that things are ok. He is having his surgery Monday.    Currently in Pain? Other (Comment)                               [facility] Adult PT Treatment/Exercise - 11/09/20 0001       Knee/Hip Exercises: Aerobic   Nustep L1 x3 min   pt needing assistance with Lt LE     Knee/Hip Exercises: Standing   Other Standing Knee Exercises at steps gentle rocking the Lt LE flexion/extension, abduction/adduction with #2 ankle weight      Knee/Hip Exercises: Seated   Ball Squeeze x20 reps    Clamshell with TheraBand Red   x10 reps   Other Seated Knee/Hip Exercises Lt clamshell x10 reps    Other Seated Knee/Hip Exercises Lt heel slide LAQ 2x10 reps      Manual Therapy   Manual Therapy Soft tissue mobilization    Manual therapy comments pillow between knees/feet    Soft tissue mobilization STM Lt lateral quad/hamstring, Lt gluteals                     PT Education - 11/09/20 1248     Education Details massage at home to decrease muscle spasm prior to surgery Monday    Person(s) Educated Patient    Methods Explanation    Comprehension Verbalized understanding              PT Short Term Goals - 10/31/20 1101       PT SHORT TERM GOAL #1   Title --    Time --    Period --    Status --    Target Date --               PT Long Term Goals - 11/09/20 1118       PT LONG TERM GOAL #1   Title be independent in advanced HEP    Time 8    Period Weeks    Status Achieved      PT LONG TERM GOAL #2   Title improve LE strength to advance and move Lt LE with 25% less UE support    Time 4    Period Weeks    Status Not Met      PT LONG TERM GOAL #3   Title perform sit to stand with Lt UE support only to reduce stress on Rt UE     Time 4  Period Weeks    Status Not Met                   Plan - 11/09/20 1249     Clinical Impression Statement Pt was able to participate in more exercise this session. Due to his limited number of visits and high levels of pain in the Lt hip/knee, he has not noted a significant improvement in his mobility. Pt is undergoing Lt THA on Monday, so he is being discharged from PT at this time. PT educated pt on the benefits of soft tissue massage between now and his surgery. Both the pt and his son verbalized understanding of this. If pt returns following his THA, we will complete a new evaluation for updated goals.    Personal Factors and Comorbidities Comorbidity 3+    Comorbidities ESRD with dialysis, HTN, CVA, chronic DJD that limits mobility    Examination-Activity Limitations Stand;Stairs;Squat;Locomotion Level;Transfers    Examination-Participation Restrictions Cleaning;Community Activity;Meal Prep    Stability/Clinical Decision Making Evolving/Moderate complexity    Rehab Potential Good    PT Frequency 2x / week    PT Duration 4 weeks    PT Treatment/Interventions ADLs/Self Care Home Management;Cryotherapy;Electrical Stimulation;Gait training;Stair training;Functional mobility training;Therapeutic activities;Therapeutic exercise;Balance training;Neuromuscular re-education;Manual techniques;Patient/family education;Passive range of motion    PT Next Visit Plan d/c'd for surgery    PT Home Exercise Plan Access Code: [email or web address]    Consulted and Agree with Plan of Care Patient             Patient will benefit from skilled therapeutic intervention in order to improve the following deficits and impairments:  Abnormal gait, Decreased activity tolerance, Postural dysfunction, Decreased mobility, Improper body mechanics, Impaired flexibility, Pain, Decreased endurance, Difficulty walking, Decreased range of motion  Visit Diagnosis: Pain in left hip  Stiffness of left hip, not  elsewhere classified  Other abnormalities of gait and mobility  Muscle weakness (generalized)     Problem List Patient Active Problem List   Diagnosis Date Noted   Primary osteoarthritis of left hip 10/07/2020   Status post reverse total arthroplasty of right shoulder 08/24/2020   Diverticulitis 03/08/2020   ESRD on hemodialysis ([place]) 03/08/2020   Polyneuropathy in diseases classified elsewhere ([place]) 03/08/2020   Pure hypercholesterolemia 03/08/2020   Sacral back pain 03/08/2020   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 11/10/2019   Hypotension of hemodialysis 07/31/2019   Diverticulitis large intestine 07/31/2019   Mass of soft tissue of shoulder 05/26/2019   Awaiting organ transplant status 03/18/2019   Finding of cocaine in blood 02/25/2019   Anaphylactic shock, unspecified, sequela 11/21/2018   Hypercalcemia 10/28/2018   Diarrhea, unspecified 08/08/2018   Hyperkalemia 03/12/2018   Patient is Jehovah's Witness 01/30/2018   Renal cell carcinoma of left kidney ([place]) 12/24/2017   Fluid overload, unspecified 07/06/2017   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   Other specified disorders of kidney and ureter 11/22/2016   Secondary hyperparathyroidism of renal origin ([place]) 10/23/2016   Other headache syndrome 08/21/2016   TIA (transient ischemic attack)    Essential hypertension    Seizures ([place])    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    Lung nodule    Diverticulitis of cecum 07/21/2015   Diverticulitis of large intestine without perforation or abscess without bleeding    Right lower quadrant pain    Headache 05/05/2015   Obstructive sleep apnea 02/08/2015   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   Diverticulosis 07/13/2014   Lower GI bleed 07/12/2014   Refusal  of blood transfusions as patient is Jehovah's Witness    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) 73/40/3709   Iron deficiency anemia, unspecified 06/10/2012   Moderate protein-calorie malnutrition ([place]) 07/29/2009   Coagulation defect, unspecified ([place]) 08/29/2006   Type 2 diabetes mellitus with diabetic peripheral angiopathy without gangrene ([place]) 08/29/2006   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 stage renal disease ([place]) 08/02/2006   PHYSICAL THERAPY DISCHARGE SUMMARY  Visits from Start of Care: 3  Current functional level related to goals / functional outcomes: See above for more details    Remaining deficits See above for more details     Education / Equipment: See above for more details   Patient agrees to discharge. Patient goals were not met. Patient is being discharged due to  having Lt THA on Monday.   1:13 PM,11/09/20 [place], [place] at [place]   [facility] [address] [place], [place], [address] Phone: [phone]   Fax:  [phone]  Name: Carl Adrian Sr. MRN: [id number] Date of Birth: 1960-12-05

## 2020-11-10 ENCOUNTER — Telehealth: Payer: Self-pay | Admitting: *Deleted

## 2020-11-10 ENCOUNTER — Encounter (HOSPITAL_COMMUNITY)
Admission: RE | Admit: 2020-11-10 | Discharge: 2020-11-10 | Disposition: A | Discharge status: Home / Self Care | Payer: Medicare Other | Source: Ambulatory Visit | Attending: Orthopaedic Surgery | Admitting: Orthopaedic Surgery

## 2020-11-10 ENCOUNTER — Encounter (HOSPITAL_COMMUNITY): Payer: Self-pay

## 2020-11-10 ENCOUNTER — Other Ambulatory Visit: Payer: Self-pay

## 2020-11-10 DIAGNOSIS — M1612 Unilateral primary osteoarthritis, left hip: Secondary | ICD-10-CM | POA: Diagnosis not present

## 2020-11-10 DIAGNOSIS — Z20822 Contact with and (suspected) exposure to covid-19: Secondary | ICD-10-CM | POA: Insufficient documentation

## 2020-11-10 DIAGNOSIS — Z8673 Personal history of transient ischemic attack (TIA), and cerebral infarction without residual deficits: Secondary | ICD-10-CM | POA: Insufficient documentation

## 2020-11-10 DIAGNOSIS — I12 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 stage renal disease: Secondary | ICD-10-CM | POA: Insufficient documentation

## 2020-11-10 DIAGNOSIS — N2581 Secondary hyperparathyroidism of renal origin: Secondary | ICD-10-CM | POA: Insufficient documentation

## 2020-11-10 DIAGNOSIS — Z79899 Other long term (current) drug therapy: Secondary | ICD-10-CM | POA: Insufficient documentation

## 2020-11-10 DIAGNOSIS — R7303 Prediabetes: Secondary | ICD-10-CM | POA: Insufficient documentation

## 2020-11-10 DIAGNOSIS — Z7982 Long term (current) use of aspirin: Secondary | ICD-10-CM | POA: Diagnosis not present

## 2020-11-10 DIAGNOSIS — N186 End stage renal disease: Secondary | ICD-10-CM | POA: Diagnosis not present

## 2020-11-10 DIAGNOSIS — K219 Gastro-esophageal reflux disease without esophagitis: Secondary | ICD-10-CM | POA: Diagnosis not present

## 2020-11-10 DIAGNOSIS — Z01812 Encounter for preprocedural laboratory examination: Secondary | ICD-10-CM | POA: Diagnosis not present

## 2020-11-10 DIAGNOSIS — Z87891 Personal history of nicotine dependence: Secondary | ICD-10-CM | POA: Diagnosis not present

## 2020-11-10 LAB — CBC WITH DIFFERENTIAL/PLATELET
Abs Immature Granulocytes: 0.01 10*3/uL (ref 0.00–0.07)
Basophils Absolute: 0.1 10*3/uL (ref 0.0–0.1)
Basophils Relative: 2 %
Eosinophils Absolute: 0.3 10*3/uL (ref 0.0–0.5)
Eosinophils Relative: 5 %
HCT: 28.4 % — ABNORMAL LOW (ref 39.0–52.0)
Hemoglobin: 8.5 g/dL — ABNORMAL LOW (ref 13.0–17.0)
Immature Granulocytes: 0 %
Lymphocytes Relative: 12 %
Lymphs Abs: 0.7 10*3/uL (ref 0.7–4.0)
MCH: 27 pg (ref 26.0–34.0)
MCHC: 29.9 g/dL — ABNORMAL LOW (ref 30.0–36.0)
MCV: 90.2 fL (ref 80.0–100.0)
Monocytes Absolute: 0.6 10*3/uL (ref 0.1–1.0)
Monocytes Relative: 10 %
Neutro Abs: 4 10*3/uL (ref 1.7–7.7)
Neutrophils Relative %: 71 %
Platelets: 327 10*3/uL (ref 150–400)
RBC: 3.15 MIL/uL — ABNORMAL LOW (ref 4.22–5.81)
RDW: 17.3 % — ABNORMAL HIGH (ref 11.5–15.5)
WBC: 5.7 10*3/uL (ref 4.0–10.5)
nRBC: 0 % (ref 0.0–0.2)

## 2020-11-10 LAB — COMPREHENSIVE METABOLIC PANEL
ALT: 11 U/L (ref 0–44)
AST: 17 U/L (ref 15–41)
Albumin: 2.7 g/dL — ABNORMAL LOW (ref 3.5–5.0)
Alkaline Phosphatase: 70 U/L (ref 38–126)
Anion gap: 14 (ref 5–15)
BUN: 28 mg/dL — ABNORMAL HIGH (ref 8–23)
CO2: 26 mmol/L (ref 22–32)
Calcium: 9.6 mg/dL (ref 8.9–10.3)
Chloride: 98 mmol/L (ref 98–111)
Creatinine, Ser: 7.22 mg/dL — ABNORMAL HIGH (ref 0.61–1.24)
GFR, Estimated: 8 mL/min — ABNORMAL LOW (ref >60)
Glucose, Bld: 149 mg/dL — ABNORMAL HIGH (ref 70–99)
Potassium: 3.5 mmol/L (ref 3.5–5.1)
Sodium: 138 mmol/L (ref 135–145)
Total Bilirubin: 0.5 mg/dL (ref 0.3–1.2)
Total Protein: 7.8 g/dL (ref 6.5–8.1)

## 2020-11-10 LAB — PROTIME-INR
INR: 1.2 (ref 0.8–1.2)
Prothrombin Time: 15.6 seconds — ABNORMAL HIGH (ref 11.4–15.2)

## 2020-11-10 LAB — SURGICAL PCR SCREEN
MRSA, PCR: NEGATIVE
Staphylococcus aureus: NEGATIVE

## 2020-11-10 LAB — SARS CORONAVIRUS 2 (TAT 6-24 HRS): SARS Coronavirus 2: NEGATIVE

## 2020-11-10 LAB — APTT: aPTT: 39 seconds — ABNORMAL HIGH (ref 24–36)

## 2020-11-10 NOTE — Progress Notes (Signed)
Ordered type and screen to be done day of surgery

## 2020-11-10 NOTE — Progress Notes (Signed)
PCP - [place] Cardiologist - Bridgette Christopher   Chest x-ray - Not indicated EKG - 10/13/20 Stress Test - 10/20/20 ECHO - 10/03/15 Cardiac Cath - Denies  Sleep Study - Yes has OSA CPAP - Does not use anymore, has lost a lot of weight  DM - Denies  Aspirin Instructions: Instructed to call Dr. Erlinda Hong to find out when to stop  ERAS Protcol - Yes  PRE-SURGERY Ensure given  COVID TEST- 11/10/20   Anesthesia review: Yes  Patient denies shortness of breath, fever, cough and chest pain at PAT appointment   All instructions explained to the patient, with a verbal understanding of the material. Patient agrees to go over the instructions while at home for a better understanding. Patient also instructed to wear a mask while in public after being tested for COVID-19. The opportunity to ask questions was provided.

## 2020-11-10 NOTE — Care Plan (Signed)
RNCM spoke with patient at length regarding his upcoming Left total hip arthroplasty with Dr. Erlinda Hong. He is an Ortho bundle patient through [facility] and is agreeable to case management. He states he will be staying with his brother after surgery, but states his brother may have to go out of town for a few days after the surgery. He has a son that drives him to dialysis on MWF each week and other family members that can assist. He has recently had shoulder replacement in July and states he is doing well from this. Has a RW, 3in1, and cane at home already. Anticipate HHPT will be needed after hospital stay. Will continue to follow for needs.

## 2020-11-10 NOTE — Telephone Encounter (Signed)
Ortho bundle pre-op call completed. 

## 2020-11-10 NOTE — Progress Notes (Signed)
[id number]  Reached out to patient to make sure he was still coming to his 8am preadmission testing appt.  Stated he was at [employer] and [employer] and would need help when he gets here d/t having to use a walker. I suggested to have [place] [employer] park his car and have volunteers bring him to our dept in a wheelchair.

## 2020-11-11 ENCOUNTER — Telehealth: Payer: Self-pay | Admitting: Orthopaedic Surgery

## 2020-11-11 DIAGNOSIS — I12 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 stage renal disease: Secondary | ICD-10-CM | POA: Diagnosis not present

## 2020-11-11 DIAGNOSIS — N186 End stage renal disease: Secondary | ICD-10-CM | POA: Diagnosis not present

## 2020-11-11 DIAGNOSIS — Z992 Dependence on renal dialysis: Secondary | ICD-10-CM | POA: Diagnosis not present

## 2020-11-11 DIAGNOSIS — R519 Headache, unspecified: Secondary | ICD-10-CM | POA: Diagnosis not present

## 2020-11-11 DIAGNOSIS — D689 Coagulation defect, unspecified: Secondary | ICD-10-CM | POA: Diagnosis not present

## 2020-11-11 DIAGNOSIS — E875 Hyperkalemia: Secondary | ICD-10-CM | POA: Diagnosis not present

## 2020-11-11 DIAGNOSIS — D509 Iron deficiency anemia, unspecified: Secondary | ICD-10-CM | POA: Diagnosis not present

## 2020-11-11 DIAGNOSIS — N2581 Secondary hyperparathyroidism of renal origin: Secondary | ICD-10-CM | POA: Diagnosis not present

## 2020-11-11 DIAGNOSIS — D631 Anemia in chronic kidney disease: Secondary | ICD-10-CM | POA: Diagnosis not present

## 2020-11-11 MED ORDER — TRANEXAMIC ACID 1000 MG/10ML IV SOLN
2000.0000 mg | INTRAVENOUS | Status: DC
  Filled 2020-11-11 (×2): qty 20

## 2020-11-11 NOTE — Progress Notes (Signed)
Anesthesia Chart Review:  Case: [id number] Date/Time: 11/14/20 1506   Procedure: LEFT TOTAL HIP ARTHROPLASTY ANTERIOR APPROACH (Left: Hip)   Anesthesia type: Choice   Pre-op diagnosis: left hip degenerative joint disease   Location: [facility] OR ROOM 04 / [place] OR   Surgeons: Leandrew Koyanagi, MD       DISCUSSION: Patient is a 60 year old male scheduled for the above procedure.   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 He is a Restaurant manager, fast food.    Last cardiology evaluation by Dr. Harrell Gave on 10/13/20. She felt the was "acceptable risk for surgery" following 10/20/20 stress test.  H/H 8.5/28.4. His is down from 10.2/31.4 on 08/25/20, after his shoulder surgery. He his a Restaurant manager, fast food. He refused blood and blood products for this July surgery. I have communicated results to Dr. Erlinda Hong who called and spoke with the patient. He wants to proceed and said he would consent to PRBC in needed in life or death situation. I reviewed with anesthesiologist Andres Shad, MD and Suella Broad, MD. Patient would need to consent to T&S and PRBC, as  would want PRBC on stand by if needed intraoperatively. Dr. Erlinda Hong will talk with Mr. Corker again. Assigned anesthesiologist can talk to patient further on the day of surgery.     Last ASA 07/26/20. 08/01/20 presurgical COVID-19 test negative. Unless he changes his mind, he will get T&S and ISTAT on the day of surgery.    VS: BP 126/79   Pulse (!) 101   Temp 36.9 C (Oral)   Resp 18   Ht 6\' 1"  (1.854 m)   Wt 88.1 kg   SpO2 100%   BMI 25.62 kg/m    PROVIDERS: Koirala, Dibas, MD is PCP   - Rosalin Hawking, MD is neurologist. Last visit 03/08/20 with Frann Rider, NP for CVA and headache follow-up. One year follow-up recommended.  [name], MD is GI   - Buford Dresser, MD is cardiologist. Visit on 10/13/20 for follow-up and preoperative evaluation. He had a low risk stress test. 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 She wrote, "Lambl's excrescence: noted on recent echo, with comments that it was also seen in 2018 and 2020. We reviewed his history of CVA. Based on imaging, this was not felt to be an embolic stroke, and it was not acute. Comments note lacunar infarct and chronic microvascular changes. -we discussed that typically indication for resection of these is embolic stroke or recurrent stroke. He does not meet this criteria. -he is already on aspirin -I am unclear why this would take him off of the transplant list, especially as it was noted in the past as well. I am uncertain  if there is another reason based on his echo that he would no longer be a transplant candidate..."   Alona Bene, MD is urologist [facility])   - Jamal Maes, MD is nephrologist   LABS: Preoperative labs noted. See DISCUSSION. (all labs ordered are listed, but only abnormal results are displayed)  Labs Reviewed  CBC WITH  DIFFERENTIAL/PLATELET - Abnormal; Notable for the following components:      Result Value   RBC 3.15 (*)    Hemoglobin 8.5 (*)    HCT 28.4 (*)    MCHC 29.9 (*)    RDW 17.3 (*)    All other components within normal limits  COMPREHENSIVE METABOLIC PANEL - Abnormal; Notable for the following components:   Glucose, Bld 149 (*)    BUN 28 (*)    Creatinine, Ser 7.22 (*)    Albumin 2.7 (*)    GFR, Estimated 8 (*)    All other components within normal limits  PROTIME-INR - Abnormal; Notable for the following components:   Prothrombin Time 15.6 (*)    All other components within normal limits  APTT - Abnormal; Notable for the following components:   aPTT 39 (*)    All other components within normal limits  SURGICAL PCR SCREEN  SARS CORONAVIRUS 2 (TAT 6-24 HRS)  By notes, his A1c was 5.4% on 06/17/20 at [facility].      OTHER: EGD 08/04/19 Havery Moros, Remo Lipps, MD): Impression: - Esophagogastric landmarks identified. - Normal esophagus, - Benign appearing focal polypoid mucosa with superficial erythematous mucosa in the gastric body - no stigmata for bleeding, I do not think causing the patient's symptoms. - Gastric ulcer in the pyloric channel with no stigmata of bleeding - healing on PPI. - No heme in the stomach or anywhere in the bowel. - Erythematous duodenopathy with hypertrophied folds as outlined - no stigmata for bleeding. - Overall, gastric ulcer appears to be healing on PPI, hopefully dark stools represented dark blood, no active bleeding noted.   EEG 12/02/15: Impression: This awake and asleep EEG is within normal limits. Clinical Correlation: A normal EEG does not exclude a clinical diagnosis of epilepsy. Clinical correlation is advised.   Sleep Study 06/23/12: - BMI 30.3, weight 230 pounds, height 73 inches, neck 16 inches. IMPRESSIONS-RECOMMENDATIONS: 1. Severe obstructive sleep apnea/hypopnea syndrome, AHI 36.9 per hour     with supine events.  Moderately loud  snoring with oxygen     desaturation to a nadir of 67% on room air...     IMAGES: CXR 06/17/20: FINDINGS: - Cardiomediastinal silhouette unchanged in size and contour. No evidence of central vascular congestion. No interlobular septal thickening. Low lung volumes persist with no new confluent airspace disease. - Degenerative changes of the spine.  No acute displaced fracture. - Scoliotic curvature of the spine, similar to the prior  IMPRESSION: Negative for acute cardiopulmonary disease       EKG: EKG 10/13/20: Normal sinus rhythm Left axis deviation Right bundle branch block Septal infarct, age undetermined    CV: Nuclear stress test 10/20/20:   The study is normal. The study is low risk.   No ST deviation was noted.   LV perfusion is normal. There is no evidence of ischemia. There is no evidence of infarction.   Left ventricular function is normal. Nuclear stress EF: 58 %. The left ventricular ejection fraction is normal (55-65%).   Prior study not available for comparison.  - Reduced counts were noted in the inferior segments with normal wall  motion consistent with diaphragm attenuation.  No evidence of ischemia or prior infarction.  This is a low risk study.   Echo 09/29/19 ([facility]): SUMMARY  The left ventricular size is normal.  There is mild concentric left ventricular hypertrophy.  Left ventricular systolic function is normal.  LV ejection fraction = 55-60%.  No segmental wall motion abnormalities seen in the left ventricle  Left ventricular filling pattern is indeterminate.  The right ventricle is normal in size and function.  The left atrium is mildly to moderately dilated.  The aortic valve is trileaflet and opens well.  There is a 1.5 to 2 cm thin, filamentous, hypermobile echodensity  noted on the ventricular side of the aortic valve, likely representing  lambl's excrescence.  Estimated right ventricular systolic pressure is 20 mmHg.  No pulmonary  hypertension.  The aortic sinus is normal size.  Mildly dilated ascending aorta.  IVC size was normal.  There is no pericardial effusion.  Compared to prior study, probably no significant change, filamentous  structure visualized on aortic valve also visible in 2020 and 2018.  Clinical correlation required. - Did not meet criteria for excision of likely Lambl's excrescence as of 11/10/19 visit with cardiologist Dr. Harrell Gave.     Carotid Doppler Studies 04/08/18 (as outlined 04/22/18 [facility] Eval): - Right: 21-39% diameter reduction of the right bulb. No evidence of hemodynamically significant internal carotid artery stenosis. The right vertebral artery flow is antegrade. - Left: 21-39% diameter reduction of the left bulb. No evidence of hemodynamically  significant internal carotid artery stenosis. The left vertebral artery flow is antegrade.    Iliac Doppler Studies 04/08/18 (as outlined 04/22/18 [facility] Eval): - Right: No evidence of critical stenosis of the right iliac artery. Patent right iliac vein. - Left: No evidence of hemodynamically significant stenosis of the left iliac artery. Patent left iliac vein.      Stress echo 03/25/18 ([facility]): SUMMARY  The patient had no chest pain during stress  The patient achieved 89 % of maximum predicted heart rate.  Negative stress ECG for inducible ischemia at target heart rate.  Negative dobutamine echocardiography for inducible ischemia at target heart  rate.    Past Medical History:  Diagnosis Date   Acute gastric ulcer with hemorrhage    Acute GI bleeding 07/31/2019   Acute pericarditis    Anemia    hx of   Anxiety    situational    Arthritis    on meds   Borderline diabetes    Cataract    bilateral sx   Chronic headaches    Depression    situational    Diverticulitis    ESRD (end stage renal disease) ([place])     On Renal Transplant List," [facility]; MWF" (10/23/2016)   GERD (gastroesophageal reflux  disease)    with certain foods   GI bleed    Hypertension    diet controlled   Parathyroid abnormality ([place])    ectopic parathyroid gland   Presence of arteriovenous fistula for hemodialysis, primary ([place])    RUE   Refusal of blood product    NO WHOLE BLOOD PROUCTS   Renal cell carcinoma ([place])    s/p hand assisted laparoscopic bilateral nephrectomies 11/29/17, + RCC left   Secondary hyperparathyroidism ([place])    Seizures ([place])    one episode in past, due to" elevated Potassium" 08/02/20- "at least 4 years ago"   Sleep apnea    doesn't use CPAP anymore since  weight loss   Stroke [facility])    no residual    Past Surgical History:  Procedure Laterality Date   AV FISTULA PLACEMENT Right    right arm   BIOPSY  08/01/2019   Procedure: BIOPSY;  Surgeon: Juanita Craver, MD;  Location: [facility] ENDOSCOPY;  Service: Endoscopy;;   CATARACT EXTRACTION W/ INTRAOCULAR LENS  IMPLANT, BILATERAL     COLON SURGERY     COLONOSCOPY N/A 08/04/2015   Procedure: COLONOSCOPY;  Surgeon: Jerene Bears, MD;  Location: [facility] ENDOSCOPY;  Service: Endoscopy;  Laterality: N/A;   COLONOSCOPY  2017   JMP@ [facility]-good prep-mass -recall 1 yr   ESOPHAGOGASTRODUODENOSCOPY N/A 08/01/2019   Procedure: ESOPHAGOGASTRODUODENOSCOPY (EGD);  Surgeon: Juanita Craver, MD;  Location: [facility] ENDOSCOPY;  Service: Endoscopy;  Laterality: N/A;   ESOPHAGOGASTRODUODENOSCOPY (EGD) WITH PROPOFOL N/A 08/04/2019   Procedure: ESOPHAGOGASTRODUODENOSCOPY (EGD) WITH PROPOFOL;  Surgeon: Yetta Flock, MD;  Location: [place];  Service: Gastroenterology;  Laterality: N/A;   graft left arm Left    for dialysis x 2. Removed   INSERTION OF DIALYSIS CATHETER     Rt chest   LAPAROSCOPIC RIGHT COLECTOMY N/A 08/05/2015   Procedure: LAPAROSCOPIC RIGHT COLECTOMY- ASCENDING;  Surgeon: Stark Klein, MD;  Location: [place];  Service: General;  Laterality: N/A;   MASS EXCISION Left 05/28/2019   Procedure: EXCISION SOFT TISSUE MASS LEFT SHOULDER;  Surgeon: Armandina Gemma, MD;  Location: [facility] ORS;  Service: General;  Laterality: Left;   NEPHRECTOMY Bilateral    PARATHYROIDECTOMY N/A 06/12/2016   Procedure: TOTAL PARATHYROIDECTOMY WITH AUTOTRANSPLANTATION TO LEFT FOREARM;  Surgeon: Armandina Gemma, MD;  Location: [place];  Service: General;  Laterality: N/A;   PARATHYROIDECTOMY N/A 10/23/2016   Procedure: PARATHYROIDECTOMY;  Surgeon: Armandina Gemma, MD;  Location: [place];  Service: General;  Laterality: N/A;   REVERSE SHOULDER ARTHROPLASTY Right 08/24/2020   Procedure: REVERSE SHOULDER ARTHROPLASTY;  Surgeon: Hiram Gash, MD;  Location: [place];  Service: Orthopedics;  Laterality: Right;   REVISON OF ARTERIOVENOUS FISTULA Right 07/16/2017   Procedure: REVISION OF ARTERIOVENOUS FISTULA  Right ARM;  Surgeon: Waynetta Sandy, MD;  Location: [place];  Service: Vascular;  Laterality: Right;   UPPER GASTROINTESTINAL ENDOSCOPY  2021   @ [facility]    MEDICATIONS:  aspirin EC 81 MG tablet   atorvastatin (LIPITOR) 10 MG tablet   B Complex-C-Folic Acid (DIALYVITE PO)   cephALEXin (KEFLEX) 500 MG capsule   cinacalcet (SENSIPAR) 60 MG tablet   cromolyn (OPTICROM) 4 % ophthalmic solution   docusate sodium (COLACE) 100 MG capsule   gabapentin (NEURONTIN) 100 MG capsule   ibuprofen (ADVIL) 800 MG tablet   LOKELMA 10 g PACK packet   methocarbamol (ROBAXIN) 500 MG tablet   midodrine (PROAMATINE) 10 MG tablet   omeprazole (PRILOSEC) 20 MG capsule   ondansetron (ZOFRAN) 4 MG tablet   oxyCODONE-acetaminophen (PERCOCET) 5-325 MG tablet   sevelamer carbonate (RENVELA) 800 MG tablet   topiramate (TOPAMAX) 25 MG tablet   Turmeric (QC TUMERIC COMPLEX PO)   vitamin E 180 MG (400 UNITS) capsule   No current facility-administered medications for this encounter.    [START ON 11/14/2020] tranexamic acid (CYKLOKAPRON) 2,000 mg in sodium chloride 0.9 % 50 mL Topical Application    Myra Gianotti, PA-C Surgical Short Stay/Anesthesiology [facility] Phone [phone] [facility] Phone [phone] 11/11/2020 4:04 PM

## 2020-11-11 NOTE — Telephone Encounter (Signed)
I spoke with Mr. Deeg this afternoon regarding his Hg of 8.5 and the fact that he's a Jehovah's witness.  He stated that if it comes down to a life or death situation, then he would gladly take any blood products to save his life.  He cannot delay the surgery any longer because of the excruciating pain that he lives with on a daily basis and he understands the alternative of postponing the surgery in order to improve the Hg level first but he does not want to postpone the surgery any longer.  We will proceed with surgery as planned and we will do everything we can to minimize surgical blood loss.  Azucena Cecil, MD [facility] 3:30 PM

## 2020-11-11 NOTE — Anesthesia Preprocedure Evaluation (Addendum)
Anesthesia Evaluation  Patient identified by MRN, date of birth, ID band Patient awake    Reviewed: Allergy & Precautions, NPO status , Patient's Chart, lab work & pertinent test results  History of Anesthesia Complications Negative for: history of anesthetic complications  Airway Mallampati: II  TM Distance: >3 FB Neck ROM: Full    Dental no notable dental hx.    Pulmonary sleep apnea , former smoker,    Pulmonary exam normal        Cardiovascular hypertension, + Peripheral Vascular Disease  Normal cardiovascular exam  Nuclear stress 10/2020: low risk, EF 58%   Neuro/Psych Anxiety Depression TIACVA, No Residual Symptoms    GI/Hepatic Neg liver ROS, PUD, GERD  Controlled and Medicated,  Endo/Other  diabetes, Type 2  Renal/GU Dialysis and ESRFRenal disease (HD M/W/F)  negative genitourinary   Musculoskeletal  (+) Arthritis ,   Abdominal   Peds  Hematology  (+) anemia , JEHOVAH'S WITNESSHgb 9.2   Anesthesia Other Findings Day of surgery medications reviewed with patient.  Reproductive/Obstetrics negative OB ROS                           Anesthesia Physical Anesthesia Plan  ASA: 4  Anesthesia Plan: General   Post-op Pain Management:    Induction: Intravenous  PONV Risk Score and Plan: 2 and Treatment may vary due to age or medical condition, Ondansetron, Dexamethasone and Midazolam  Airway Management Planned: Oral ETT  Additional Equipment: None  Intra-op Plan:   Post-operative Plan: Extubation in OR  Informed Consent: I have reviewed the patients History and Physical, chart, labs and discussed the procedure including the risks, benefits and alternatives for the proposed anesthesia with the patient or authorized representative who has indicated his/her understanding and acceptance.     Dental advisory given  Plan Discussed with: CRNA  Anesthesia Plan Comments: (See PAT note  written 11/11/2020 by Myra Gianotti, PA-C. HGB 8.5 on 11/10/20. ESRD, Jehovah's Witness. As of 11/11/20, he told Dr. Erlinda Hong he would accept blood and blood products if life or death situation. He will get ISTAT and T&S, PRBC on arrival for surgery if he continues to consent per anesthesiologist's evaluation.    Per discussion with patient in preop, he does not wish to receive blood products unless absolutely necessary to prevent permanent disability or death, but consents to receiving blood products in that scenario. Daiva Huge, MD )     Anesthesia Quick Evaluation

## 2020-11-14 ENCOUNTER — Ambulatory Visit: Payer: Self-pay | Admitting: Orthopaedic Surgery

## 2020-11-14 ENCOUNTER — Encounter (HOSPITAL_COMMUNITY): Payer: Self-pay | Admitting: Orthopaedic Surgery

## 2020-11-14 ENCOUNTER — Ambulatory Visit (HOSPITAL_COMMUNITY): Payer: Medicare Other

## 2020-11-14 ENCOUNTER — Observation Stay (HOSPITAL_COMMUNITY): Payer: Medicare Other

## 2020-11-14 ENCOUNTER — Ambulatory Visit (HOSPITAL_COMMUNITY): Payer: Medicare Other | Admitting: Vascular Surgery

## 2020-11-14 ENCOUNTER — Inpatient Hospital Stay (HOSPITAL_COMMUNITY)
Admission: AD | Admit: 2020-11-14 | Discharge: 2020-11-25 | DRG: 469 | Disposition: A | Discharge status: Home / Self Care | Payer: Medicare Other | Source: Ambulatory Visit | Attending: Family Medicine | Admitting: Family Medicine

## 2020-11-14 ENCOUNTER — Encounter (HOSPITAL_COMMUNITY)
Admission: AD | Disposition: A | Discharge status: Home / Self Care | Payer: Self-pay | Source: Ambulatory Visit | Attending: Internal Medicine

## 2020-11-14 ENCOUNTER — Other Ambulatory Visit: Payer: Self-pay

## 2020-11-14 DIAGNOSIS — Z961 Presence of intraocular lens: Secondary | ICD-10-CM | POA: Diagnosis not present

## 2020-11-14 DIAGNOSIS — D62 Acute posthemorrhagic anemia: Secondary | ICD-10-CM | POA: Diagnosis not present

## 2020-11-14 DIAGNOSIS — M25572 Pain in left ankle and joints of left foot: Secondary | ICD-10-CM | POA: Diagnosis present

## 2020-11-14 DIAGNOSIS — Z9049 Acquired absence of other specified parts of digestive tract: Secondary | ICD-10-CM

## 2020-11-14 DIAGNOSIS — Z885 Allergy status to narcotic agent status: Secondary | ICD-10-CM

## 2020-11-14 DIAGNOSIS — Z992 Dependence on renal dialysis: Secondary | ICD-10-CM

## 2020-11-14 DIAGNOSIS — I953 Hypotension of hemodialysis: Secondary | ICD-10-CM | POA: Diagnosis present

## 2020-11-14 DIAGNOSIS — K254 Chronic or unspecified gastric ulcer with hemorrhage: Secondary | ICD-10-CM | POA: Diagnosis present

## 2020-11-14 DIAGNOSIS — Z85528 Personal history of other malignant neoplasm of kidney: Secondary | ICD-10-CM | POA: Diagnosis not present

## 2020-11-14 DIAGNOSIS — D631 Anemia in chronic kidney disease: Secondary | ICD-10-CM | POA: Diagnosis not present

## 2020-11-14 DIAGNOSIS — E1169 Type 2 diabetes mellitus with other specified complication: Secondary | ICD-10-CM | POA: Diagnosis present

## 2020-11-14 DIAGNOSIS — K922 Gastrointestinal hemorrhage, unspecified: Secondary | ICD-10-CM

## 2020-11-14 DIAGNOSIS — N186 End stage renal disease: Secondary | ICD-10-CM

## 2020-11-14 DIAGNOSIS — Z9842 Cataract extraction status, left eye: Secondary | ICD-10-CM | POA: Diagnosis not present

## 2020-11-14 DIAGNOSIS — Z905 Acquired absence of kidney: Secondary | ICD-10-CM

## 2020-11-14 DIAGNOSIS — K2981 Duodenitis with bleeding: Secondary | ICD-10-CM | POA: Diagnosis present

## 2020-11-14 DIAGNOSIS — Z9841 Cataract extraction status, right eye: Secondary | ICD-10-CM

## 2020-11-14 DIAGNOSIS — E1122 Type 2 diabetes mellitus with diabetic chronic kidney disease: Secondary | ICD-10-CM | POA: Diagnosis present

## 2020-11-14 DIAGNOSIS — K3189 Other diseases of stomach and duodenum: Secondary | ICD-10-CM

## 2020-11-14 DIAGNOSIS — M1612 Unilateral primary osteoarthritis, left hip: Principal | ICD-10-CM | POA: Diagnosis present

## 2020-11-14 DIAGNOSIS — I10 Essential (primary) hypertension: Secondary | ICD-10-CM | POA: Diagnosis present

## 2020-11-14 DIAGNOSIS — I12 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 stage renal disease: Secondary | ICD-10-CM | POA: Diagnosis present

## 2020-11-14 DIAGNOSIS — M25552 Pain in left hip: Secondary | ICD-10-CM | POA: Diagnosis present

## 2020-11-14 DIAGNOSIS — D649 Anemia, unspecified: Secondary | ICD-10-CM | POA: Diagnosis present

## 2020-11-14 DIAGNOSIS — K2100 Gastro-esophageal reflux disease with esophagitis, without bleeding: Secondary | ICD-10-CM | POA: Diagnosis present

## 2020-11-14 DIAGNOSIS — K2960 Other gastritis without bleeding: Secondary | ICD-10-CM | POA: Diagnosis not present

## 2020-11-14 DIAGNOSIS — D509 Iron deficiency anemia, unspecified: Secondary | ICD-10-CM | POA: Diagnosis not present

## 2020-11-14 DIAGNOSIS — Z8249 Family history of ischemic heart disease and other diseases of the circulatory system: Secondary | ICD-10-CM

## 2020-11-14 DIAGNOSIS — K5730 Diverticulosis of large intestine without perforation or abscess without bleeding: Secondary | ICD-10-CM | POA: Diagnosis not present

## 2020-11-14 DIAGNOSIS — E785 Hyperlipidemia, unspecified: Secondary | ICD-10-CM | POA: Diagnosis present

## 2020-11-14 DIAGNOSIS — K2971 Gastritis, unspecified, with bleeding: Secondary | ICD-10-CM | POA: Diagnosis present

## 2020-11-14 DIAGNOSIS — Z7982 Long term (current) use of aspirin: Secondary | ICD-10-CM

## 2020-11-14 DIAGNOSIS — Z832 Family history of diseases of the blood and blood-forming organs and certain disorders involving the immune mechanism: Secondary | ICD-10-CM

## 2020-11-14 DIAGNOSIS — N2581 Secondary hyperparathyroidism of renal origin: Secondary | ICD-10-CM | POA: Diagnosis present

## 2020-11-14 DIAGNOSIS — Z87891 Personal history of nicotine dependence: Secondary | ICD-10-CM

## 2020-11-14 DIAGNOSIS — R195 Other fecal abnormalities: Secondary | ICD-10-CM | POA: Diagnosis not present

## 2020-11-14 DIAGNOSIS — Z471 Aftercare following joint replacement surgery: Secondary | ICD-10-CM | POA: Diagnosis not present

## 2020-11-14 DIAGNOSIS — R519 Headache, unspecified: Secondary | ICD-10-CM | POA: Diagnosis not present

## 2020-11-14 DIAGNOSIS — R197 Diarrhea, unspecified: Secondary | ICD-10-CM | POA: Diagnosis not present

## 2020-11-14 DIAGNOSIS — M7989 Other specified soft tissue disorders: Secondary | ICD-10-CM | POA: Diagnosis not present

## 2020-11-14 DIAGNOSIS — D638 Anemia in other chronic diseases classified elsewhere: Secondary | ICD-10-CM | POA: Diagnosis present

## 2020-11-14 DIAGNOSIS — Z96649 Presence of unspecified artificial hip joint: Secondary | ICD-10-CM

## 2020-11-14 DIAGNOSIS — Z96642 Presence of left artificial hip joint: Secondary | ICD-10-CM | POA: Diagnosis not present

## 2020-11-14 DIAGNOSIS — K449 Diaphragmatic hernia without obstruction or gangrene: Secondary | ICD-10-CM | POA: Diagnosis present

## 2020-11-14 DIAGNOSIS — N189 Chronic kidney disease, unspecified: Secondary | ICD-10-CM | POA: Diagnosis not present

## 2020-11-14 DIAGNOSIS — G4733 Obstructive sleep apnea (adult) (pediatric): Secondary | ICD-10-CM | POA: Diagnosis not present

## 2020-11-14 DIAGNOSIS — Z8049 Family history of malignant neoplasm of other genital organs: Secondary | ICD-10-CM

## 2020-11-14 DIAGNOSIS — K250 Acute gastric ulcer with hemorrhage: Secondary | ICD-10-CM

## 2020-11-14 DIAGNOSIS — Z833 Family history of diabetes mellitus: Secondary | ICD-10-CM

## 2020-11-14 DIAGNOSIS — E875 Hyperkalemia: Secondary | ICD-10-CM | POA: Diagnosis not present

## 2020-11-14 DIAGNOSIS — M898X9 Other specified disorders of bone, unspecified site: Secondary | ICD-10-CM | POA: Diagnosis present

## 2020-11-14 DIAGNOSIS — K648 Other hemorrhoids: Secondary | ICD-10-CM | POA: Diagnosis not present

## 2020-11-14 DIAGNOSIS — K319 Disease of stomach and duodenum, unspecified: Secondary | ICD-10-CM

## 2020-11-14 DIAGNOSIS — Z888 Allergy status to other drugs, medicaments and biological substances status: Secondary | ICD-10-CM

## 2020-11-14 DIAGNOSIS — Z823 Family history of stroke: Secondary | ICD-10-CM

## 2020-11-14 DIAGNOSIS — Z8673 Personal history of transient ischemic attack (TIA), and cerebral infarction without residual deficits: Secondary | ICD-10-CM | POA: Diagnosis not present

## 2020-11-14 DIAGNOSIS — K219 Gastro-esophageal reflux disease without esophagitis: Secondary | ICD-10-CM | POA: Diagnosis present

## 2020-11-14 DIAGNOSIS — Z96611 Presence of right artificial shoulder joint: Secondary | ICD-10-CM | POA: Diagnosis not present

## 2020-11-14 DIAGNOSIS — Z419 Encounter for procedure for purposes other than remedying health state, unspecified: Secondary | ICD-10-CM

## 2020-11-14 DIAGNOSIS — Z79899 Other long term (current) drug therapy: Secondary | ICD-10-CM

## 2020-11-14 DIAGNOSIS — K2289 Other specified disease of esophagus: Secondary | ICD-10-CM

## 2020-11-14 HISTORY — PX: TOTAL HIP ARTHROPLASTY: SHX124

## 2020-11-14 LAB — POCT I-STAT, CHEM 8
BUN: 29 mg/dL — ABNORMAL HIGH (ref 8–23)
Calcium, Ion: 1.04 mmol/L — ABNORMAL LOW (ref 1.15–1.40)
Chloride: 98 mmol/L (ref 98–111)
Creatinine, Ser: 7.5 mg/dL — ABNORMAL HIGH (ref 0.61–1.24)
Glucose, Bld: 70 mg/dL (ref 70–99)
HCT: 27 % — ABNORMAL LOW (ref 39.0–52.0)
Hemoglobin: 9.2 g/dL — ABNORMAL LOW (ref 13.0–17.0)
Potassium: 3.8 mmol/L (ref 3.5–5.1)
Sodium: 137 mmol/L (ref 135–145)
TCO2: 26 mmol/L (ref 22–32)

## 2020-11-14 LAB — PREPARE RBC (CROSSMATCH)

## 2020-11-14 SURGERY — ARTHROPLASTY, HIP, TOTAL, ANTERIOR APPROACH
Anesthesia: General | Site: Hip | Laterality: Left

## 2020-11-14 MED ORDER — CEFAZOLIN SODIUM-DEXTROSE 2-4 GM/100ML-% IV SOLN
2.0000 g | Freq: Once | INTRAVENOUS | Status: AC
  Administered 2020-11-15: 2 g via INTRAVENOUS
  Filled 2020-11-14: qty 100

## 2020-11-14 MED ORDER — IRRISEPT - 450ML BOTTLE WITH 0.05% CHG IN STERILE WATER, USP 99.95% OPTIME
TOPICAL | Status: DC | PRN
  Administered 2020-11-14: 450 mL via TOPICAL

## 2020-11-14 MED ORDER — PHENYLEPHRINE HCL (PRESSORS) 10 MG/ML IV SOLN
INTRAVENOUS | Status: DC | PRN
  Administered 2020-11-14 (×3): 160 ug via INTRAVENOUS

## 2020-11-14 MED ORDER — DEXAMETHASONE SODIUM PHOSPHATE 10 MG/ML IJ SOLN
10.0000 mg | Freq: Once | INTRAMUSCULAR | Status: AC
  Administered 2020-11-15: 10 mg via INTRAVENOUS
  Filled 2020-11-14: qty 1

## 2020-11-14 MED ORDER — SODIUM CHLORIDE 0.9 % IV SOLN: INTRAVENOUS | Status: DC

## 2020-11-14 MED ORDER — CHLORHEXIDINE GLUCONATE 0.12 % MT SOLN
OROMUCOSAL | Status: AC
  Administered 2020-11-14: 15 mL via OROMUCOSAL
  Filled 2020-11-14: qty 15

## 2020-11-14 MED ORDER — CEFAZOLIN SODIUM-DEXTROSE 2-4 GM/100ML-% IV SOLN
2.0000 g | INTRAVENOUS | Status: AC
  Administered 2020-11-14: 2 g via INTRAVENOUS

## 2020-11-14 MED ORDER — ASPIRIN 81 MG PO CHEW
81.0000 mg | CHEWABLE_TABLET | Freq: Two times a day (BID) | ORAL | Status: DC
  Administered 2020-11-14 – 2020-11-25 (×19): 81 mg via ORAL
  Filled 2020-11-14 (×20): qty 1

## 2020-11-14 MED ORDER — ACETAMINOPHEN 325 MG PO TABS: 325.0000 mg | ORAL_TABLET | Freq: Once | ORAL | Status: DC | PRN

## 2020-11-14 MED ORDER — POVIDONE-IODINE 10 % EX SWAB
2.0000 "application " | Freq: Once | CUTANEOUS | Status: AC
  Administered 2020-11-14: 2 via TOPICAL

## 2020-11-14 MED ORDER — CEFAZOLIN SODIUM-DEXTROSE 2-4 GM/100ML-% IV SOLN
INTRAVENOUS | Status: AC
  Filled 2020-11-14: qty 100

## 2020-11-14 MED ORDER — PHENOL 1.4 % MT LIQD: 1.0000 | OROMUCOSAL | Status: DC | PRN

## 2020-11-14 MED ORDER — HYDROMORPHONE HCL 1 MG/ML IJ SOLN
INTRAMUSCULAR | Status: AC
  Filled 2020-11-14: qty 1

## 2020-11-14 MED ORDER — PANTOPRAZOLE SODIUM 40 MG PO TBEC
40.0000 mg | DELAYED_RELEASE_TABLET | Freq: Every day | ORAL | Status: DC
  Administered 2020-11-14 – 2020-11-16 (×3): 40 mg via ORAL
  Filled 2020-11-14 (×3): qty 1

## 2020-11-14 MED ORDER — OXYCODONE HCL 5 MG PO TABS
5.0000 mg | ORAL_TABLET | ORAL | Status: DC | PRN
  Administered 2020-11-15 – 2020-11-24 (×10): 10 mg via ORAL
  Filled 2020-11-14 (×2): qty 2
  Filled 2020-11-14: qty 1
  Filled 2020-11-14 (×4): qty 2

## 2020-11-14 MED ORDER — TRANEXAMIC ACID-NACL 1000-0.7 MG/100ML-% IV SOLN
1000.0000 mg | INTRAVENOUS | Status: AC
  Administered 2020-11-14: 1000 mg via INTRAVENOUS

## 2020-11-14 MED ORDER — MEPERIDINE HCL 25 MG/ML IJ SOLN: 6.2500 mg | INTRAMUSCULAR | Status: DC | PRN

## 2020-11-14 MED ORDER — ONDANSETRON HCL 4 MG/2ML IJ SOLN
INTRAMUSCULAR | Status: DC | PRN
  Administered 2020-11-14: 4 mg via INTRAVENOUS

## 2020-11-14 MED ORDER — METOCLOPRAMIDE HCL 5 MG PO TABS
5.0000 mg | ORAL_TABLET | Freq: Three times a day (TID) | ORAL | Status: DC | PRN
Start: 2020-11-14 — End: 2020-11-25

## 2020-11-14 MED ORDER — MENTHOL 3 MG MT LOZG: 1.0000 | LOZENGE | OROMUCOSAL | Status: DC | PRN

## 2020-11-14 MED ORDER — DEXAMETHASONE SODIUM PHOSPHATE 10 MG/ML IJ SOLN
INTRAMUSCULAR | Status: DC | PRN
  Administered 2020-11-14: 5 mg via INTRAVENOUS

## 2020-11-14 MED ORDER — TRANEXAMIC ACID-NACL 1000-0.7 MG/100ML-% IV SOLN
INTRAVENOUS | Status: AC
  Filled 2020-11-14: qty 100

## 2020-11-14 MED ORDER — CHLORHEXIDINE GLUCONATE 0.12 % MT SOLN: 15.0000 mL | Freq: Once | OROMUCOSAL | Status: AC

## 2020-11-14 MED ORDER — ACETAMINOPHEN 160 MG/5ML PO SOLN: 325.0000 mg | Freq: Once | ORAL | Status: DC | PRN

## 2020-11-14 MED ORDER — FENTANYL CITRATE (PF) 250 MCG/5ML IJ SOLN
INTRAMUSCULAR | Status: DC | PRN
  Administered 2020-11-14: 50 ug via INTRAVENOUS
  Administered 2020-11-14: 100 ug via INTRAVENOUS
  Administered 2020-11-14 (×2): 50 ug via INTRAVENOUS

## 2020-11-14 MED ORDER — VANCOMYCIN HCL 1000 MG IV SOLR
INTRAVENOUS | Status: AC
  Filled 2020-11-14: qty 20

## 2020-11-14 MED ORDER — HYDROMORPHONE HCL 1 MG/ML IJ SOLN
0.5000 mg | INTRAMUSCULAR | Status: DC | PRN
  Administered 2020-11-16 – 2020-11-17 (×3): 1 mg via INTRAVENOUS
  Filled 2020-11-14 (×3): qty 1

## 2020-11-14 MED ORDER — SODIUM CHLORIDE 0.9 % IV SOLN
INTRAVENOUS | Status: DC | PRN
  Administered 2020-11-14: 50 ug/min via INTRAVENOUS

## 2020-11-14 MED ORDER — AMISULPRIDE (ANTIEMETIC) 5 MG/2ML IV SOLN: 10.0000 mg | Freq: Once | INTRAVENOUS | Status: DC | PRN

## 2020-11-14 MED ORDER — FENTANYL CITRATE (PF) 250 MCG/5ML IJ SOLN
INTRAMUSCULAR | Status: AC
  Filled 2020-11-14: qty 5

## 2020-11-14 MED ORDER — ALUM & MAG HYDROXIDE-SIMETH 200-200-20 MG/5ML PO SUSP: 30.0000 mL | ORAL | Status: DC | PRN

## 2020-11-14 MED ORDER — VASOPRESSIN 20 UNIT/ML IV SOLN
INTRAVENOUS | Status: DC | PRN
  Administered 2020-11-14: 1 [IU] via INTRAVENOUS

## 2020-11-14 MED ORDER — ONDANSETRON HCL 4 MG PO TABS: 4.0000 mg | ORAL_TABLET | Freq: Four times a day (QID) | ORAL | Status: DC | PRN

## 2020-11-14 MED ORDER — PROMETHAZINE HCL 25 MG/ML IJ SOLN: 6.2500 mg | INTRAMUSCULAR | Status: DC | PRN

## 2020-11-14 MED ORDER — PROPOFOL 10 MG/ML IV BOLUS
INTRAVENOUS | Status: DC | PRN
  Administered 2020-11-14: 20 mg via INTRAVENOUS
  Administered 2020-11-14: 140 mg via INTRAVENOUS
  Administered 2020-11-14: 30 mg via INTRAVENOUS

## 2020-11-14 MED ORDER — ACETAMINOPHEN 10 MG/ML IV SOLN: 1000.0000 mg | Freq: Once | INTRAVENOUS | Status: DC | PRN

## 2020-11-14 MED ORDER — BUPIVACAINE-MELOXICAM ER 400-12 MG/14ML IJ SOLN
INTRAMUSCULAR | Status: AC
  Filled 2020-11-14: qty 1

## 2020-11-14 MED ORDER — CEFAZOLIN SODIUM-DEXTROSE 2-4 GM/100ML-% IV SOLN: 2.0000 g | Freq: Four times a day (QID) | INTRAVENOUS | Status: DC

## 2020-11-14 MED ORDER — ROCURONIUM BROMIDE 10 MG/ML (PF) SYRINGE
PREFILLED_SYRINGE | INTRAVENOUS | Status: DC | PRN
  Administered 2020-11-14: 70 mg via INTRAVENOUS

## 2020-11-14 MED ORDER — LIDOCAINE 2% (20 MG/ML) 5 ML SYRINGE
INTRAMUSCULAR | Status: DC | PRN
  Administered 2020-11-14: 100 mg via INTRAVENOUS

## 2020-11-14 MED ORDER — ONDANSETRON HCL 4 MG/2ML IJ SOLN: 4.0000 mg | Freq: Four times a day (QID) | INTRAMUSCULAR | Status: DC | PRN

## 2020-11-14 MED ORDER — ACETAMINOPHEN 325 MG PO TABS
325.0000 mg | ORAL_TABLET | Freq: Four times a day (QID) | ORAL | Status: DC | PRN
  Filled 2020-11-14: qty 2

## 2020-11-14 MED ORDER — SORBITOL 70 % SOLN
30.0000 mL | Freq: Every day | Status: DC | PRN
  Filled 2020-11-14: qty 30

## 2020-11-14 MED ORDER — VASOPRESSIN 20 UNIT/ML IV SOLN
INTRAVENOUS | Status: AC
  Filled 2020-11-14: qty 1

## 2020-11-14 MED ORDER — METHOCARBAMOL 1000 MG/10ML IJ SOLN
500.0000 mg | Freq: Four times a day (QID) | INTRAVENOUS | Status: DC | PRN
  Filled 2020-11-14: qty 5

## 2020-11-14 MED ORDER — POLYETHYLENE GLYCOL 3350 17 G PO PACK
17.0000 g | PACK | Freq: Every day | ORAL | Status: DC
  Administered 2020-11-15 – 2020-11-24 (×6): 17 g via ORAL
  Filled 2020-11-14 (×7): qty 1

## 2020-11-14 MED ORDER — ORAL CARE MOUTH RINSE: 15.0000 mL | Freq: Once | OROMUCOSAL | Status: AC

## 2020-11-14 MED ORDER — OXYCODONE HCL 5 MG PO TABS
10.0000 mg | ORAL_TABLET | ORAL | Status: DC | PRN
  Administered 2020-11-15 – 2020-11-22 (×6): 15 mg via ORAL
  Filled 2020-11-14: qty 3
  Filled 2020-11-14: qty 2
  Filled 2020-11-14: qty 3
  Filled 2020-11-14 (×3): qty 2
  Filled 2020-11-14 (×2): qty 3
  Filled 2020-11-14: qty 2
  Filled 2020-11-14: qty 3

## 2020-11-14 MED ORDER — DOCUSATE SODIUM 100 MG PO CAPS
100.0000 mg | ORAL_CAPSULE | Freq: Two times a day (BID) | ORAL | Status: DC
  Administered 2020-11-15 – 2020-11-24 (×17): 100 mg via ORAL
  Filled 2020-11-14 (×18): qty 1

## 2020-11-14 MED ORDER — BUPIVACAINE-MELOXICAM ER 400-12 MG/14ML IJ SOLN
INTRAMUSCULAR | Status: DC | PRN
  Administered 2020-11-14: 14 mL

## 2020-11-14 MED ORDER — OXYCODONE HCL ER 10 MG PO T12A
10.0000 mg | EXTENDED_RELEASE_TABLET | Freq: Two times a day (BID) | ORAL | Status: DC
  Administered 2020-11-14 – 2020-11-25 (×20): 10 mg via ORAL
  Filled 2020-11-14 (×22): qty 1

## 2020-11-14 MED ORDER — LACTATED RINGERS IV SOLN: INTRAVENOUS | Status: DC

## 2020-11-14 MED ORDER — ACETAMINOPHEN 500 MG PO TABS
1000.0000 mg | ORAL_TABLET | Freq: Four times a day (QID) | ORAL | Status: AC
  Administered 2020-11-15 (×4): 1000 mg via ORAL
  Filled 2020-11-14 (×3): qty 2

## 2020-11-14 MED ORDER — TRANEXAMIC ACID 1000 MG/10ML IV SOLN
INTRAVENOUS | Status: DC | PRN
  Administered 2020-11-14: 2000 mg via TOPICAL

## 2020-11-14 MED ORDER — HYDROMORPHONE HCL 1 MG/ML IJ SOLN
0.2500 mg | INTRAMUSCULAR | Status: DC | PRN
  Administered 2020-11-14 (×5): 0.25 mg via INTRAVENOUS

## 2020-11-14 MED ORDER — SODIUM CHLORIDE 0.9 % IR SOLN
Status: DC | PRN
  Administered 2020-11-14: 1000 mL

## 2020-11-14 MED ORDER — METHOCARBAMOL 500 MG PO TABS
500.0000 mg | ORAL_TABLET | Freq: Four times a day (QID) | ORAL | Status: DC | PRN
  Administered 2020-11-18 – 2020-11-21 (×2): 500 mg via ORAL
  Filled 2020-11-14 (×2): qty 1

## 2020-11-14 MED ORDER — DIPHENHYDRAMINE HCL 12.5 MG/5ML PO ELIX: 25.0000 mg | ORAL_SOLUTION | ORAL | Status: DC | PRN

## 2020-11-14 MED ORDER — 0.9 % SODIUM CHLORIDE (POUR BTL) OPTIME
TOPICAL | Status: DC | PRN
  Administered 2020-11-14: 1000 mL

## 2020-11-14 MED ORDER — TOPIRAMATE 25 MG PO TABS
25.0000 mg | ORAL_TABLET | Freq: Two times a day (BID) | ORAL | Status: DC
  Administered 2020-11-14 – 2020-11-25 (×19): 25 mg via ORAL
  Filled 2020-11-14 (×19): qty 1

## 2020-11-14 MED ORDER — VANCOMYCIN HCL 1 G IV SOLR
INTRAVENOUS | Status: DC | PRN
  Administered 2020-11-14: 1000 mg via TOPICAL

## 2020-11-14 MED ORDER — TRANEXAMIC ACID-NACL 1000-0.7 MG/100ML-% IV SOLN
1000.0000 mg | Freq: Once | INTRAVENOUS | Status: DC
  Filled 2020-11-14: qty 100

## 2020-11-14 MED ORDER — METOCLOPRAMIDE HCL 5 MG/ML IJ SOLN: 5.0000 mg | Freq: Three times a day (TID) | INTRAMUSCULAR | Status: DC | PRN

## 2020-11-14 SURGICAL SUPPLY — 68 items
BAG COUNTER SPONGE SURGICOUNT (BAG) ×2 IMPLANT
BAG DECANTER FOR FLEXI CONT (MISCELLANEOUS) ×2 IMPLANT
BAG SPNG CNTER NS LX DISP (BAG) ×1
CELLS DAT CNTRL 66122 CELL SVR (MISCELLANEOUS) IMPLANT
COVER PERINEAL POST (MISCELLANEOUS) ×2 IMPLANT
COVER SURGICAL LIGHT HANDLE (MISCELLANEOUS) ×2 IMPLANT
CUP SECTOR GRIPTON 58MM (Orthopedic Implant) ×1 IMPLANT
DRAPE C-ARM 42X72 X-RAY (DRAPES) ×2 IMPLANT
DRAPE POUCH INSTRU U-SHP 10X18 (DRAPES) ×2 IMPLANT
DRAPE STERI IOBAN 125X83 (DRAPES) ×2 IMPLANT
DRAPE U-SHAPE 47X51 STRL (DRAPES) ×4 IMPLANT
DRESSING PEEL AND PLAC PRVNA20 (GAUZE/BANDAGES/DRESSINGS) IMPLANT
DRESSING PEEL AND PLC PRVNA 13 (GAUZE/BANDAGES/DRESSINGS) IMPLANT
DRSG AQUACEL AG ADV 3.5X10 (GAUZE/BANDAGES/DRESSINGS) ×1 IMPLANT
DRSG PEEL AND PLACE PREVENA 13 (GAUZE/BANDAGES/DRESSINGS) ×2
DRSG PEEL AND PLACE PREVENA 20 (GAUZE/BANDAGES/DRESSINGS) ×2
DURAPREP 26ML APPLICATOR (WOUND CARE) ×4 IMPLANT
ELECT BLADE 4.0 EZ CLEAN MEGAD (MISCELLANEOUS) ×2
ELECT REM PT RETURN 9FT ADLT (ELECTROSURGICAL) ×2
ELECTRODE BLDE 4.0 EZ CLN MEGD (MISCELLANEOUS) ×1 IMPLANT
ELECTRODE REM PT RTRN 9FT ADLT (ELECTROSURGICAL) ×1 IMPLANT
GLOVE SURG LTX SZ7 (GLOVE) ×4 IMPLANT
GLOVE SURG NEOP MICRO LF SZ7.5 (GLOVE) ×2 IMPLANT
GLOVE SURG SYN 7.5  E (GLOVE) ×8
GLOVE SURG SYN 7.5 E (GLOVE) ×4 IMPLANT
GLOVE SURG SYN 7.5 PF PI (GLOVE) ×4 IMPLANT
GLOVE SURG UNDER POLY LF SZ7 (GLOVE) ×10 IMPLANT
GOWN STRL REIN XL XLG (GOWN DISPOSABLE) ×2 IMPLANT
GOWN STRL REUS W/ TWL LRG LVL3 (GOWN DISPOSABLE) IMPLANT
GOWN STRL REUS W/ TWL XL LVL3 (GOWN DISPOSABLE) ×1 IMPLANT
GOWN STRL REUS W/TWL LRG LVL3 (GOWN DISPOSABLE)
GOWN STRL REUS W/TWL XL LVL3 (GOWN DISPOSABLE) ×2
HANDPIECE INTERPULSE COAX TIP (DISPOSABLE) ×2
HEAD CERAMIC DELTA 36 PLUS 1.5 (Hips) ×1 IMPLANT
HOOD PEEL AWAY FLYTE STAYCOOL (MISCELLANEOUS) ×4 IMPLANT
IV NS IRRIG 3000ML ARTHROMATIC (IV SOLUTION) ×2 IMPLANT
JET LAVAGE IRRISEPT WOUND (IRRIGATION / IRRIGATOR) ×2
KIT BASIN OR (CUSTOM PROCEDURE TRAY) ×2 IMPLANT
LAVAGE JET IRRISEPT WOUND (IRRIGATION / IRRIGATOR) ×1 IMPLANT
LINER NEUTRAL 36X58 PLUS4 ×1 IMPLANT
MARKER SKIN DUAL TIP RULER LAB (MISCELLANEOUS) ×2 IMPLANT
NDL SPNL 18GX3.5 QUINCKE PK (NEEDLE) ×1 IMPLANT
NEEDLE SPNL 18GX3.5 QUINCKE PK (NEEDLE) ×2 IMPLANT
PACK TOTAL JOINT (CUSTOM PROCEDURE TRAY) ×2 IMPLANT
PACK UNIVERSAL I (CUSTOM PROCEDURE TRAY) ×2 IMPLANT
PAD ORTHO SHOULDER 7X19 LRG (SOFTGOODS) ×1 IMPLANT
RETRACTOR WND ALEXIS 18 MED (MISCELLANEOUS) IMPLANT
RTRCTR WOUND ALEXIS 18CM MED (MISCELLANEOUS)
SAW OSC TIP CART 19.5X105X1.3 (SAW) ×2 IMPLANT
SCREW 6.5MMX30MM (Screw) ×1 IMPLANT
SET HNDPC FAN SPRY TIP SCT (DISPOSABLE) ×1 IMPLANT
STAPLER VISISTAT 35W (STAPLE) IMPLANT
STEM FEM ACTIS HIGH SZ7 (Stem) ×1 IMPLANT
SUT ETHIBOND 2 V 37 (SUTURE) ×2 IMPLANT
SUT ETHILON 2 0 FS 18 (SUTURE) ×2 IMPLANT
SUT VIC AB 0 CT1 27 (SUTURE) ×2
SUT VIC AB 0 CT1 27XBRD ANBCTR (SUTURE) ×1 IMPLANT
SUT VIC AB 1 CTX 36 (SUTURE) ×2
SUT VIC AB 1 CTX36XBRD ANBCTR (SUTURE) ×1 IMPLANT
SUT VIC AB 2-0 CT1 27 (SUTURE) ×6
SUT VIC AB 2-0 CT1 36 (SUTURE) ×2 IMPLANT
SUT VIC AB 2-0 CT1 TAPERPNT 27 (SUTURE) ×2 IMPLANT
SYR 50ML LL SCALE MARK (SYRINGE) ×2 IMPLANT
TOWEL GREEN STERILE (TOWEL DISPOSABLE) ×2 IMPLANT
TRAY CATH 16FR W/PLASTIC CATH (SET/KITS/TRAYS/PACK) IMPLANT
TRAY FOLEY W/BAG SLVR 16FR (SET/KITS/TRAYS/PACK) ×2
TRAY FOLEY W/BAG SLVR 16FR ST (SET/KITS/TRAYS/PACK) ×1 IMPLANT
YANKAUER SUCT BULB TIP NO VENT (SUCTIONS) ×2 IMPLANT

## 2020-11-14 NOTE — Anesthesia Procedure Notes (Signed)
Procedure Name: Intubation Date/Time: 11/14/2020 4:14 PM Performed by: Annamary Carolin, CRNA Pre-anesthesia Checklist: Patient identified, Emergency Drugs available, Suction available and Patient being monitored Patient Re-evaluated:Patient Re-evaluated prior to induction Oxygen Delivery Method: Circle System Utilized Preoxygenation: Pre-oxygenation with 100% oxygen Induction Type: IV induction Ventilation: Mask ventilation without difficulty Laryngoscope Size: Mac and 3 Grade View: Grade I Tube type: Oral Number of attempts: 1 Airway Equipment and Method: Stylet Placement Confirmation: ETT inserted through vocal cords under direct vision, positive ETCO2 and breath sounds checked- equal and bilateral Secured at: 25 cm Tube secured with: Tape Dental Injury: Teeth and Oropharynx as per pre-operative assessment  Comments: With ease

## 2020-11-14 NOTE — Op Note (Signed)
LEFT TOTAL HIP ARTHROPLASTY ANTERIOR APPROACH  Procedure Note Ibn Stief Sr.   [id number]  Pre-op Diagnosis: left hip degenerative joint disease     Post-op Diagnosis: same   Operative Procedures  1. Total hip replacement; Left hip; uncemented [id number]  2. Application of left hip incisional wound vac.  Surgeon: Frankey Shown, M.D.  Assist: Madalyn Rob, PA-C   Anesthesia: general, local  Prosthesis: Depuy Acetabulum: Pinnacle 58 mm Femur: Actis 7 HO Head: 36 mm size: +1.5 Liner: +4  Bearing Type: ceramic/poly  Total Hip Arthroplasty (Anterior Approach) Op Note:  Mr. Vivas is a 60 year old male on with ESRD on HD who failed conservative management for left hip avascular necrosis.  Due to being a Jehovah's witness, and having a preoperative hemoglobin of 8.5, the anesthesia team and I were concerned about proceeding with elective surgery therefore we had a discussion about this and we all agreed to proceed with surgery if Mr. Mulford was willing to take blood or blood products if it came down to life or death.  We also discussed the option of postponing surgery to allow for his hemoglobin to increase but he elected to proceed with surgery today instead.  After informed consent was obtained and the operative extremity marked in the holding area, the patient was brought back to the operating room and placed supine on the HANA table. Next, the operative extremity was prepped and draped in normal sterile fashion. Surgical timeout occurred verifying patient identification, surgical site, surgical procedure and administration of antibiotics.  A modified anterior Smith-Peterson approach to the hip was performed, using the interval between tensor fascia lata and sartorius.  Dissection was carried bluntly down onto the anterior hip capsule. The lateral femoral circumflex vessels were identified and coagulated. A capsulotomy was performed and the capsular flaps tagged for later repair.   There was a large joint effusion.  The neck osteotomy was performed. The femoral head was removed which showed severe collapse and resorption consistent with AVN.  There was also a large amount of synovitis and bony debris within the joint.  The synovitis was very friable and prone to bleeding.  The acetabular rim was cleared of soft tissue and attention was turned to reaming the acetabulum.  Sequential reaming was performed under fluoroscopic guidance. We reamed to a size 57 mm, and then impacted the acetabular shell. A 25 mm cancellous screw was placed through the shell for added fixation.  The liner was then placed after irrigation and attention turned to the femur.  After placing the femoral hook, the leg was taken to externally rotated, extended and adducted position taking care to perform soft tissue releases to allow for adequate mobilization of the femur.  There was quite a bit of muscular tension from his leg being short.  Soft tissue was cleared from the shoulder of the greater trochanter and the hook elevator used to improve exposure of the proximal femur. Sequential broaching performed up to a size 7. Trial neck and head were placed. The leg was brought back up to neutral and the construct reduced.  Antibiotic irrigation was placed in the surgical wound and kept for at least 1 minute.  The position and sizing of components, offset and leg lengths were checked using fluoroscopy. Stability of the construct was checked in extension and external rotation without any subluxation or impingement of prosthesis. We dislocated the prosthesis, dropped the leg back into position, removed trial components, and irrigated copiously. The final stem and head  was then placed, the leg brought back up, the system reduced and fluoroscopy used to verify positioning.  We irrigated, obtained hemostasis and closed the capsule using #2 ethibond suture.  One gram of vancomycin powder was placed in the surgical bed.   One gram  of topical tranexamic acid was injected into the joint.  The fascia was closed with #1 vicryl plus, the deep fat layer was closed with 0 vicryl, the subcutaneous layers closed with 2.0 Vicryl Plus and the skin closed with 2.0 nylon and incisional wound VAC. The patient was awakened in the operating room and taken to recovery in stable condition.  All sponge, needle, and instrument counts were correct at the end of the case.   Tawanna Cooler, my PA, was a medical necessity for opening, closing, limb positioning, retracting, exposing, and overall facilitation and timely completion of the surgery.  Position: supine  Complications: see description of procedure.  Time Out: performed   Drains/Packing: none  Estimated blood loss: see anesthesia record  Returned to Recovery Room: in good condition.   Antibiotics: yes   Mechanical VTE (DVT) Prophylaxis: sequential compression devices, TED thigh-high  Chemical VTE (DVT) Prophylaxis: aspirin   Fluid Replacement: see anesthesia record  Specimens Removed: 1 to pathology   Sponge and Instrument Count Correct? yes   PACU: portable radiograph - low AP   Plan/RTC: Due to medical complexities, the hospitalist service has graciously agreed to accept the patient onto their service.  He will also be set up with dialysis while he's here.  We will follow along with the patient daily until discharge Weight Bearing/Load Lower Extremity: full  Hip precautions: none Suture Removal: 2 weeks   N. Eduard Roux, MD [facility] 7:25 PM   Implant Name Type Inv. Item Serial No. Manufacturer Lot No. LRB No. Used Action  CUP SECTOR GRIPTON 58MM - [id number] Orthopedic Implant CUP SECTOR GRIPTON 58MM [id number] [employer] [id number] Left 1 Implanted  SCREW 6.5MMX30MM - [id number] Screw SCREW 6.5MMX30MM [id number] [employer] [id number] Left 1 Implanted  LINER NEUTRAL 36X58 PLUS4 - [id number]  LINER NEUTRAL 36X58 PLUS4 [id number] DEPUY  ORTHOPAEDICS [id number] Left 1 Implanted  STEM FEM ACTIS HIGH SZ7 - [id number] Stem STEM FEM ACTIS HIGH SZ7 [id number] [employer] OR[employer]OPA[employer] [id number] Left 1 Implanted  HEAD CERAMIC DELTA 36 PLUS 1.5 - [id number] Hips HEAD CERAMIC DELTA 36 PLUS 1.5 [id number] [employer]THOP[employer] [id number] Left 1 Implanted

## 2020-11-14 NOTE — Plan of Care (Signed)

## 2020-11-14 NOTE — Transfer of Care (Signed)
Immediate Anesthesia Transfer of Care Note  Patient: Carl Found Sr.  Procedure(s) Performed: LEFT TOTAL HIP ARTHROPLASTY ANTERIOR APPROACH (Left: Hip)  Patient Location: PACU  Anesthesia Type:General  Level of Consciousness: awake, drowsy and patient cooperative  Airway & Oxygen Therapy: Patient Spontanous Breathing and Patient connected to face mask oxygen  Post-op Assessment: Report given to RN, Post -op Vital signs reviewed and stable and Patient moving all extremities X 4  Post vital signs: Reviewed and stable  Last Vitals:  Vitals Value Taken Time  BP 120/87 11/14/20 1754  Temp    Pulse 92 11/14/20 1757  Resp 15 11/14/20 1758  SpO2 100 % 11/14/20 1757  Vitals shown include unvalidated device data.  Last Pain:  Vitals:   11/14/20 1357  TempSrc: Oral  PainSc:          Complications: No notable events documented.

## 2020-11-14 NOTE — H&P (Signed)
PREOPERATIVE H&P  Chief Complaint: left hip degenerative joint disease  HPI: Carl Sassone Sr. is a 60 y.o. male who presents for surgical treatment of left hip degenerative joint disease.  He denies any changes in medical history.  Past Medical History:  Diagnosis Date   Acute gastric ulcer with hemorrhage    Acute GI bleeding 07/31/2019   Acute pericarditis    Anemia    hx of   Anxiety    situational    Arthritis    on meds   Borderline diabetes    Cataract    bilateral sx   Chronic headaches    Depression    situational    Diverticulitis    ESRD (end stage renal disease) ([place])     On Renal Transplant List," [facility]; MWF" (10/23/2016)   GERD (gastroesophageal reflux disease)    with certain foods   GI bleed    Hypertension    diet controlled   Parathyroid abnormality ([place])    ectopic parathyroid gland   Presence of arteriovenous fistula for hemodialysis, primary ([place])    RUE   Refusal of blood product    NO WHOLE BLOOD PROUCTS   Renal cell carcinoma ([place])    s/p hand assisted laparoscopic bilateral nephrectomies 11/29/17, + RCC left   Secondary hyperparathyroidism ([place])    Seizures ([place])    one episode in past, due to" elevated Potassium" 08/02/20- "at least 4 years ago"   Sleep apnea    doesn't use CPAP anymore since weight loss   Stroke [facility])    no residual   Past Surgical History:  Procedure Laterality Date   AV FISTULA PLACEMENT Right    right arm   BIOPSY  08/01/2019   Procedure: BIOPSY;  Surgeon: Juanita Craver, MD;  Location: [facility] ENDOSCOPY;  Service: Endoscopy;;   CATARACT EXTRACTION W/ INTRAOCULAR LENS  IMPLANT, BILATERAL     COLON SURGERY     COLONOSCOPY N/A 08/04/2015   Procedure: COLONOSCOPY;  Surgeon: Jerene Bears, MD;  Location: [facility] ENDOSCOPY;  Service: Endoscopy;  Laterality: N/A;   COLONOSCOPY  2017   JMP@ [facility]-good prep-mass -recall 1 yr   ESOPHAGOGASTRODUODENOSCOPY N/A 08/01/2019   Procedure: ESOPHAGOGASTRODUODENOSCOPY (EGD);   Surgeon: Juanita Craver, MD;  Location[facility] ENDOSCOPY;  Service: Endoscopy;  Laterality: N/A;   ESOPHAGOGASTRODUODENOSCOPY (EGD) WITH PROPOFOL N/A 08/04/2019   Procedure: ESOPHAGOGASTRODUODENOSCOPY (EGD) WITH PROPOFOL;  Surgeon: Yetta Flock, MD;  Location: [place];  Service: Gastroenterology;  Laterality: N/A;   graft left arm Left    for dialysis x 2. Removed   INSERTION OF DIALYSIS CATHETER     Rt chest   LAPAROSCOPIC RIGHT COLECTOMY N/A 08/05/2015   Procedure: LAPAROSCOPIC RIGHT COLECTOMY- ASCENDING;  Surgeon: Stark Klein, MD;  Location: [place];  Service: General;  Laterality: N/A;   MASS EXCISION Left 05/28/2019   Procedure: EXCISION SOFT TISSUE MASS LEFT SHOULDER;  Surgeon: Armandina Gemma, MD;  Location: [facility] ORS;  Service: General;  Laterality: Left;   NEPHRECTOMY Bilateral    PARATHYROIDECTOMY N/A 06/12/2016   Procedure: TOTAL PARATHYROIDECTOMY WITH AUTOTRANSPLANTATION TO LEFT FOREARM;  Surgeon: Armandina Gemma, MD;  Location: [place];  Service: General;  Laterality: N/A;   PARATHYROIDECTOMY N/A 10/23/2016   Procedure: PARATHYROIDECTOMY;  Surgeon: Armandina Gemma, MD;  Location: [place];  Service: General;  Laterality: N/A;   REVERSE SHOULDER ARTHROPLASTY Right 08/24/2020   Procedure: REVERSE SHOULDER ARTHROPLASTY;  Surgeon: Hiram Gash, MD;  Location: [place];  Service: Orthopedics;  Laterality: Right;  REVISON OF ARTERIOVENOUS FISTULA Right 07/16/2017   Procedure: REVISION OF ARTERIOVENOUS FISTULA  Right ARM;  Surgeon: Waynetta Sandy, MD;  Location: [place];  Service: Vascular;  Laterality: Right;   UPPER GASTROINTESTINAL ENDOSCOPY  2021   @ [facility]   Social History   Socioeconomic History   Marital status: Single    Spouse name: Not on file   Number of children: 2   Years of education: Not on file   Highest education level: Not on file  Occupational History   Occupation: retired    Fish farm manager: SELF EMPLOYED  Tobacco Use   Smoking status: Former    Types: Cigarettes     Quit date: 06/19/1990    Years since quitting: 30.4   Smokeless tobacco: Never   Tobacco comments:    rarely cigar  Vaping Use   Vaping Use: Never used  Substance and Sexual Activity   Alcohol use: Yes    Alcohol/week: 1.0 standard drink    Types: 1 Cans of beer per week    Comment: occasional beer   Drug use: Not Currently    Types: Marijuana    Comment: Last use several yrs ago   Sexual activity: Not Currently  Other Topics Concern   Not on file  Social History Narrative   Lives alone at home   12 years of education    2 children    Not married    Social Determinants of Radio broadcast assistant Strain: Not on file  Food Insecurity: Not on file  Transportation Needs: Not on file  Physical Activity: Not on file  Stress: Not on file  Social Connections: Not on file   Family History  Problem Relation Age of Onset   Diabetes Father    Stroke Father    Hypertension Father    Uterine cancer Mother    Lupus Sister    Stroke Sister    Hypertension Sister    Anuerysm Brother        brain   Colon cancer Neg Hx    Esophageal cancer Neg Hx    Stomach cancer Neg Hx    Pancreatic cancer Neg Hx    Liver disease Neg Hx    Colon polyps Neg Hx    Rectal cancer Neg Hx    Allergies  Allergen Reactions   Infed [Iron Dextran] Other (See Comments)    Decreased BP    Oxycodone Nausea Only   Vicodin [Hydrocodone-Acetaminophen] Other (See Comments)    Decreased BP   Whole Blood Other (See Comments)    Patient refuses whole blood "unless I am going to die"   Hydrocodone-Acetaminophen     Other reaction(s): lower BP   Diclofenac Other (See Comments)    Unknown   Prior to Admission medications   Medication Sig Start Date End Date Taking? Authorizing Provider  aspirin EC 81 MG tablet Take 1 tablet (81 mg total) by mouth 2 (two) times daily. To be taken after surgery Patient taking differently: Take 81 mg by mouth every other day. 11/08/20  Yes Aundra Dubin, PA-C   atorvastatin (LIPITOR) 10 MG tablet Take 10 mg by mouth every evening.   Yes [provider]  B Complex-C-Folic Acid (DIALYVITE PO) Take 1 tablet by mouth daily.   Yes [provider]  cinacalcet (SENSIPAR) 60 MG tablet Take 120 mg by mouth daily with supper.   Yes [provider]  cromolyn (OPTICROM) 4 % ophthalmic solution Place 1 drop into both  eyes daily as needed (redness).   Yes [provider]  ibuprofen (ADVIL) 800 MG tablet Take 800 mg by mouth 3 (three) times daily as needed for moderate pain.   Yes [provider]  LOKELMA 10 g PACK packet Take 1 packet by mouth daily. 03/11/19  Yes [provider]  midodrine (PROAMATINE) 10 MG tablet Take 10 mg by mouth See admin instructions. Take 10 mg by mouth three times a week as directed, bring to dialysis. May need an additional tablet during treatment.   Yes [provider]  omeprazole (PRILOSEC) 20 MG capsule Take 40 mg by mouth in the morning and at bedtime.   Yes [provider]  sevelamer carbonate (RENVELA) 800 MG tablet Take 1,600-4,000 mg by mouth See admin instructions. Take 4000 mg by mouth three times daily with meals and 1600-2400 mg with snacks 10/11/20  Yes [provider]  topiramate (TOPAMAX) 25 MG tablet Take 1 tablet (25 mg total) by mouth 2 (two) times daily. 03/08/20  Yes McCue, Janett Billow, NP  Turmeric (QC TUMERIC COMPLEX PO) Take 1 capsule by mouth daily. 08/06/19  Yes [provider]  vitamin E 180 MG (400 UNITS) capsule Take 400 Units by mouth daily.   Yes [provider]  cephALEXin (KEFLEX) 500 MG capsule Take 1 capsule (500 mg total) by mouth 4 (four) times daily. To be taken after surgery 11/08/20   Aundra Dubin, PA-C  docusate sodium (COLACE) 100 MG capsule Take 1 capsule (100 mg total) by mouth daily as needed. Patient taking differently: Take 100 mg by mouth in the morning and at bedtime. 11/08/20 11/08/21  Aundra Dubin,  PA-C  gabapentin (NEURONTIN) 100 MG capsule Take 1 capsule (100 mg total) by mouth 3 (three) times daily for 14 days. For pain Patient not taking: Reported on 11/09/2020 08/25/20 09/08/20  Ethelda Chick, PA-C  methocarbamol (ROBAXIN) 500 MG tablet Take 1 tablet (500 mg total) by mouth 2 (two) times daily as needed. To be taken after surgery 11/08/20   Aundra Dubin, PA-C  ondansetron (ZOFRAN) 4 MG tablet Take 1 tablet (4 mg total) by mouth every 8 (eight) hours as needed for nausea or vomiting. 11/08/20   Aundra Dubin, PA-C  oxyCODONE-acetaminophen (PERCOCET) 5-325 MG tablet Take 1-2 tablets by mouth every 6 (six) hours as needed. To be taken after surgery 11/08/20   Aundra Dubin, PA-C     Positive ROS: All other systems have been reviewed and were otherwise negative with the exception of those mentioned in the HPI and as above.  Physical Exam: General: Alert, no acute distress Cardiovascular: No pedal edema Respiratory: No cyanosis, no use of accessory musculature GI: abdomen soft Skin: No lesions in the area of chief complaint Neurologic: Sensation intact distally Psychiatric: Patient is competent for consent with normal mood and affect Lymphatic: no lymphedema  MUSCULOSKELETAL: exam stable  Assessment: left hip degenerative joint disease  Plan: Plan for Procedure(s): LEFT TOTAL HIP ARTHROPLASTY ANTERIOR APPROACH  The risks benefits and alternatives were discussed with the patient including but not limited to the risks of nonoperative treatment, versus surgical intervention including infection, bleeding, nerve injury,  blood clots, cardiopulmonary complications, morbidity, mortality, among others, and they were willing to proceed.   Preoperative templating of the joint replacement has been completed, documented, and submitted to the Operating Room personnel in order to optimize intra-operative equipment management.   Eduard Roux, MD 11/14/2020 2:52 PM

## 2020-11-14 NOTE — Consult Note (Signed)
Medical Consultation   Carl Umar Sr.  GYB:[id number]  DOB: [birth year]  DOA: 11/14/2020  PCP: [name], MD   Outpatient Specialists: cardiology: Dr. Harrell Gave, nephrology:  unsure   Requesting physician: Dr. Erlinda Hong  Reason for consultation: Manage high risk co morbidities, needs dialysis (ESRD patient)   History of Present Illness: Carl Deboard Sr. is an 60 y.o. male with history of end-stage renal disease (dialysis Monday Wednesday Friday), hypertension, type 2 diabetes, TIA and lacunar CVA, GERD, anemia of chronic kidney disease, hyperlipidemia, OSA, osteoarthritis of left hip. We have been asked to consult to manage medical co morbidities status post total left hip replacement.   [name] is resting comfortably in the PACU. He is doing well and only complaint is that his left ankle hurts. Started after the surgery. He had dialysis session this AM. Denies any trauma, past injury or history of gout.    Review of Systems:   Review of Systems  Constitutional:  Negative for chills, diaphoresis and fever.  HENT:  Negative for congestion, sinus pressure, sinus pain and sore throat.   Eyes:  Negative for visual disturbance.  Respiratory:  Negative for cough and shortness of breath.   Cardiovascular:  Positive for leg swelling. Negative for chest pain and palpitations.       Baseline edema   Gastrointestinal:  Negative for abdominal pain, diarrhea, nausea and vomiting.  Genitourinary:  Negative for dysuria.  Musculoskeletal:  Positive for arthralgias (left ankle).  Skin:  Negative for rash.  Neurological:  Negative for dizziness, weakness, light-headedness and headaches.  Psychiatric/Behavioral:  Negative for agitation and confusion. The patient is not nervous/anxious.   Past Medical History: Past Medical History:  Diagnosis Date   Acute gastric ulcer with hemorrhage    Acute GI bleeding 07/31/2019   Acute pericarditis    Anemia    hx of   Anxiety     situational    Arthritis    on meds   Borderline diabetes    Cataract    bilateral sx   Chronic headaches    Depression    situational    Diverticulitis    ESRD (end stage renal disease) ([place])     On Renal Transplant List," [facility]; MWF" (10/23/2016)   GERD (gastroesophageal reflux disease)    with certain foods   GI bleed    Hypertension    diet controlled   Parathyroid abnormality (HCC)    ectopic parathyroid gland   Presence of arteriovenous fistula for hemodialysis, primary ([place])    RUE   Refusal of blood product    NO WHOLE BLOOD PROUCTS   Renal cell carcinoma ([place])    s/p hand assisted laparoscopic bilateral nephrectomies 11/29/17, + RCC left   Secondary hyperparathyroidism ([place])    Seizures ([place])    one episode in past, due to" elevated Potassium" 08/02/20- "at least 4 years ago"   Sleep apnea    doesn't use CPAP anymore since weight loss   Stroke [facility])    no residual    Past Surgical History: Past Surgical History:  Procedure Laterality Date   AV FISTULA PLACEMENT Right    right arm   BIOPSY  08/01/2019   Procedure: BIOPSY;  Surgeon: Juanita Craver, MD;  Location: [facility] ENDOSCOPY;  Service: Endoscopy;;   CATARACT EXTRACTION W/ INTRAOCULAR LENS  IMPLANT, BILATERAL     COLON SURGERY     COLONOSCOPY N/A 08/04/2015  Procedure: COLONOSCOPY;  Surgeon: Jerene Bears, MD;  Location: [facility] ENDOSCOPY;  Service: Endoscopy;  Laterality: N/A;   COLONOSCOPY  2017   JMP@ [facility]-good prep-mass -recall 1 yr   ESOPHAGOGASTRODUODENOSCOPY N/A 08/01/2019   Procedure: ESOPHAGOGASTRODUODENOSCOPY (EGD);  Surgeon: Juanita Craver, MD;  Location: [facility] ENDOSCOPY;  Service: Endoscopy;  Laterality: N/A;   ESOPHAGOGASTRODUODENOSCOPY (EGD) WITH PROPOFOL N/A 08/04/2019   Procedure: ESOPHAGOGASTRODUODENOSCOPY (EGD) WITH PROPOFOL;  Surgeon: Yetta Flock, MD;  Location: [place];  Service: Gastroenterology;  Laterality: N/A;   graft left arm Left    for dialysis x 2. Removed   INSERTION OF  DIALYSIS CATHETER     Rt chest   LAPAROSCOPIC RIGHT COLECTOMY N/A 08/05/2015   Procedure: LAPAROSCOPIC RIGHT COLECTOMY- ASCENDING;  Surgeon: Stark Klein, MD;  Location: [place];  Service: General;  Laterality: N/A;   MASS EXCISION Left 05/28/2019   Procedure: EXCISION SOFT TISSUE MASS LEFT SHOULDER;  Surgeon: Armandina Gemma, MD;  Location: [facility];  Service: General;  Laterality: Left;   NEPHRECTOMY Bilateral    PARATHYROIDECTOMY N/A 06/12/2016   Procedure: TOTAL PARATHYROIDECTOMY WITH AUTOTRANSPLANTATION TO LEFT FOREARM;  Surgeon: Armandina Gemma, MD;  Location: [place];  Service: General;  Laterality: N/A;   PARATHYROIDECTOMY N/A 10/23/2016   Procedure: PARATHYROIDECTOMY;  Surgeon: Armandina Gemma, MD;  Location: [place];  Service: General;  Laterality: N/A;   REVERSE SHOULDER ARTHROPLASTY Right 08/24/2020   Procedure: REVERSE SHOULDER ARTHROPLASTY;  Surgeon: Hiram Gash, MD;  Location: [place];  Service: Orthopedics;  Laterality: Right;   REVISON OF ARTERIOVENOUS FISTULA Right 07/16/2017   Procedure: REVISION OF ARTERIOVENOUS FISTULA  Right ARM;  Surgeon: Waynetta Sandy, MD;  Location: [place];  Service: Vascular;  Laterality: Right;   UPPER GASTROINTESTINAL ENDOSCOPY  2021   @ [facility]     Allergies:   Allergies  Allergen Reactions   Infed [Iron Dextran] Other (See Comments)    Decreased BP    Oxycodone Nausea Only   Vicodin [Hydrocodone-Acetaminophen] Other (See Comments)    Decreased BP   Whole Blood Other (See Comments)    Patient refuses whole blood "unless I am going to die"   Hydrocodone-Acetaminophen     Other reaction(s): lower BP   Diclofenac Other (See Comments)    Unknown     Social History:  reports that he quit smoking about 30 years ago. His smoking use included cigarettes. He has never used smokeless tobacco. He reports current alcohol use of about 1.0 standard drink per week. He reports that he does not currently use drugs after having used the following drugs:  Marijuana.   Family History: Family History  Problem Relation Age of Onset   Diabetes Father    Stroke Father    Hypertension Father    Uterine cancer Mother    Lupus Sister    Stroke Sister    Hypertension Sister    Anuerysm Brother        brain   Colon cancer Neg Hx    Esophageal cancer Neg Hx    Stomach cancer Neg Hx    Pancreatic cancer Neg Hx    Liver disease Neg Hx    Colon polyps Neg Hx    Rectal cancer Neg Hx     Physical Exam: Vitals:   11/14/20 1357  BP: 129/86  Pulse: 73  Resp: 18  Temp: 97.8 F (36.6 C)  TempSrc: Oral  SpO2: 99%  Weight: 88.1 kg  Height: 6\' 1"  (1.854 m)    Constitutional:  Alert and awake, oriented x3, not in any acute distress. Eyes: PERLA, EOMI, irises appear normal, anicteric sclera,  ENMT: external ears and nose appear normal, normal hearing             Lips appears normal, oropharynx mucosa, tongue, posterior pharynx appear normal  Neck: neck appears normal, no masses, normal ROM, no thyromegaly, no JVD  CVS: S1-S2 clear. bilateral pedal edema, normal pedal pulses  Respiratory:  clear to auscultation bilaterally, no wheezing, rales or rhonchi. Respiratory effort normal. No accessory muscle use.  Abdomen: soft nontender, nondistended, normal bowel sounds, no hepatosplenomegaly, no hernias  Musculoskeletal: : no cyanosis, clubbing. Left ankle with point tenderness to lateral malleolus. Can flex and dorsiflex foot. Minimal abductor/adductor movement with hip surgery. Baseline edema, no warmth.                       Neuro: Cranial nerves II-XII intact, strength, sensation, reflexes Psych: judgement and insight appear normal, stable mood and affect, mental status Skin: no rashes or lesions or ulcers, no induration or nodules. Av fistula, right wrist, good thrill   Data reviewed:  I have personally reviewed following labs and imaging studies Labs:  CBC: Recent Labs  Lab 11/10/20 0930 11/14/20 1431  WBC 5.7  --   NEUTROABS 4.0   --   HGB 8.5* 9.2*  HCT 28.4* 27.0*  MCV 90.2  --   PLT 327  --     Basic Metabolic Panel: Recent Labs  Lab 11/10/20 0930 11/14/20 1431  NA 138 137  K 3.5 3.8  CL 98 98  CO2 26  --   GLUCOSE 149* 70  BUN 28* 29*  CREATININE 7.22* 7.50*  CALCIUM 9.6  --    GFR Estimated Creatinine Clearance: 11.5 mL/min (A) (by C-G formula based on SCr of 7.5 mg/dL (H)). Liver Function Tests: Recent Labs  Lab 11/10/20 0930  AST 17  ALT 11  ALKPHOS 70  BILITOT 0.5  PROT 7.8  ALBUMIN 2.7*   No results for input(s): LIPASE, AMYLASE in the last 168 hours. No results for input(s): AMMONIA in the last 168 hours. Coagulation profile Recent Labs  Lab 11/10/20 0930  INR 1.2    Cardiac Enzymes: No results for input(s): CKTOTAL, CKMB, CKMBINDEX, TROPONINI in the last 168 hours. BNP: Invalid input(s): POCBNP CBG: No results for input(s): GLUCAP in the last 168 hours. D-Dimer No results for input(s): DDIMER in the last 72 hours. Hgb A1c No results for input(s): HGBA1C in the last 72 hours. Lipid Profile No results for input(s): CHOL, HDL, LDLCALC, TRIG, CHOLHDL, LDLDIRECT in the last 72 hours. Thyroid function studies No results for input(s): TSH, T4TOTAL, T3FREE, THYROIDAB in the last 72 hours.  Invalid input(s): FREET3 Anemia work up No results for input(s): VITAMINB12, FOLATE, FERRITIN, TIBC, IRON, RETICCTPCT in the last 72 hours. Urinalysis    Component Value Date/Time   COLORURINE YELLOW 08/06/2006 0806   APPEARANCEUR CLEAR 08/06/2006 0806   LABSPEC 1.013 08/06/2006 0806   PHURINE 8.0 08/06/2006 0806   GLUCOSEU 100 (A) 08/06/2006 0806   HGBUR MODERATE (A) 08/06/2006 0806   BILIRUBINUR NEGATIVE 08/06/2006 0806   KETONESUR NEGATIVE 08/06/2006 0806   PROTEINUR > 300 08/06/2006 0806   UROBILINOGEN 0.2 08/06/2006 0806   NITRITE NEGATIVE 08/06/2006 0806   LEUKOCYTESUR SMALL 08/06/2006 0806     Sepsis Labs Invalid input(s): PROCALCITONIN,  WBC,   LACTICIDVEN Microbiology Recent Results (from the past 240 hour(s))  Surgical pcr screen  Status: None   Collection Time: 11/10/20  8:59 AM   Specimen: Nasal Mucosa; Nasal Swab  Result Value Ref Range Status   MRSA, PCR NEGATIVE NEGATIVE Final   Staphylococcus aureus NEGATIVE NEGATIVE Final    Comment: (NOTE) The Xpert SA Assay (FDA approved for NASAL specimens in patients 8 years of age and older), is one component of a comprehensive surveillance program. It is not intended to diagnose infection nor to guide or monitor treatment. Performed at [facility], [place] [address], [place], [place] [address]   SARS CORONAVIRUS 2 (TAT 6-24 HRS) Nasopharyngeal Nasopharyngeal Swab     Status: None   Collection Time: 11/10/20  9:00 AM   Specimen: Nasopharyngeal Swab  Result Value Ref Range Status   SARS Coronavirus 2 NEGATIVE NEGATIVE Final    Comment: (NOTE) SARS-CoV-2 target nucleic acids are NOT DETECTED.  The SARS-CoV-2 RNA is generally detectable in upper and lower respiratory specimens during the acute phase of infection. Negative results do not preclude SARS-CoV-2 infection, do not rule out co-infections with other pathogens, and should not be used as the sole basis for treatment or other patient management decisions. Negative results must be combined with clinical observations, patient history, and epidemiological information. The expected result is Negative.  Fact Sheet for Patients: SugarRoll.be  Fact Sheet for Healthcare Providers: https://www.woods-mathews.com/  This test is not yet approved or cleared by the [place] FDA and  has been authorized for detection and/or diagnosis of SARS-CoV-2 by FDA under an Emergency Use Authorization (EUA). This EUA will remain  in effect (meaning this test can be used) for the duration of the COVID-19 declaration under Se ction 564(b)(1) of the Act, 21 U.S.C. section 360bbb-3(b)(1),  unless the authorization is terminated or revoked sooner.  Performed at [facility], [place] [address] [place], [place] [address]        Inpatient Medications:   Scheduled Meds:  tranexamic acid (CYKLOKAPRON) topical - INTRAOP  2,000 mg Topical To OR   Continuous Infusions:  sodium chloride 10 mL/hr at 11/14/20 1543     Radiological Exams on Admission: DG C-Arm 1-60 Min-No Report  Result Date: 11/14/2020 Fluoroscopy was utilized by the requesting physician.  No radiographic interpretation.   DG C-Arm 1-60 Min-No Report  Result Date: 11/14/2020 Fluoroscopy was utilized by the requesting physician.  No radiographic interpretation.    Impression/Recommendations Principal Problem:   Primary osteoarthritis of left hip s/p total hip arthroplasty-today  -per orthopedic team -surgery today -pain management per ortho -patient is a [place] witness, but has discussed with ortho will consent to blood transfusion if needed to save his life. Hgb stable.   Active Problems: Acute left ankle pain -started after surgery -check ankle xray and uric acid.    ESRD on hemodialysis [facility]) -dialysis MWF, had his session this AM -lab work all stable -will let nephrology know he is here for regular session on Wednesday  Type 2 diabetes mellitus with other specified complication ([place]) -diet controlled. Last a1c of 5.3 in 4/21 -will recheck a1c, but in prediabetic range so will hold off on accuchecks at this time -recommend carb modified/renal diet    GERD (gastroesophageal reflux disease) -continue PPI    History of lacunar cerebrovascular accident (CVA) -continue ASA when okay with surgery and lipitor     Anemia of renal disease Baseline hgb: ranges from 8.6-11.2, ? If any PRBC or iron tx with dialysis Stable, continue to monitor    HLD (hyperlipidemia) -continue lipitor  Essential hypertension Appears to have dialysis related hypotension -continue midodrine before  dialysis sessions      Obstructive sleep apnea  -not on any cpap machine    Thank you for this consultation.  Our [facility] hospitalist team will follow the patient with you.   Time Spent: 60 minutes   Dragon dictation used in completing this note.    Orma Flaming M.D. [facility] 11/14/2020, 5:46 PM

## 2020-11-14 NOTE — Discharge Instructions (Signed)
INSTRUCTIONS AFTER JOINT REPLACEMENT   Remove items at home which could result in a fall. This includes throw rugs or furniture in walking pathways ICE to the affected joint every three hours while awake for 30 minutes at a time, for at least the first 3-5 days, and then as needed for pain and swelling.  Continue to use ice for pain and swelling. You may notice swelling that will progress down to the foot and ankle.  This is normal after surgery.  Elevate your leg when you are not up walking on it.   Continue to use the breathing machine you got in the hospital (incentive spirometer) which will help keep your temperature down.  It is common for your temperature to cycle up and down following surgery, especially at night when you are not up moving around and exerting yourself.  The breathing machine keeps your lungs expanded and your temperature down.   DIET:  As you were doing prior to hospitalization, we recommend a well-balanced diet.  DRESSING / WOUND CARE / SHOWERING  Keep the surgical dressing until follow up.  The dressing is water proof, so you can shower without any extra covering.  IF THE DRESSING FALLS OFF or the wound gets wet inside, change the dressing with sterile gauze.  Please use good hand washing techniques before changing the dressing.  Do not use any lotions or creams on the incision until instructed by your surgeon.    ACTIVITY  Increase activity slowly as tolerated, but follow the weight bearing instructions below.   No driving for 6 weeks or until further direction given by your physician.  You cannot drive while taking narcotics.  No lifting or carrying greater than 10 lbs. until further directed by your surgeon. Avoid periods of inactivity such as sitting longer than an hour when not asleep. This helps prevent blood clots.  You may return to work once you are authorized by your doctor.     WEIGHT BEARING   Weight bearing as tolerated with assist device (walker, cane,  etc) as directed, use it as long as suggested by your surgeon or therapist, typically at least 4-6 weeks.   EXERCISES  Results after joint replacement surgery are often greatly improved when you follow the exercise, range of motion and muscle strengthening exercises prescribed by your doctor. Safety measures are also important to protect the joint from further injury. Any time any of these exercises cause you to have increased pain or swelling, decrease what you are doing until you are comfortable again and then slowly increase them. If you have problems or questions, call your caregiver or physical therapist for advice.   Rehabilitation is important following a joint replacement. After just a few days of immobilization, the muscles of the leg can become weakened and shrink (atrophy).  These exercises are designed to build up the tone and strength of the thigh and leg muscles and to improve motion. Often times heat used for twenty to thirty minutes before working out will loosen up your tissues and help with improving the range of motion but do not use heat for the first two weeks following surgery (sometimes heat can increase post-operative swelling).   These exercises can be done on a training (exercise) mat, on the floor, on a table or on a bed. Use whatever works the best and is most comfortable for you.    Use music or television while you are exercising so that the exercises are a pleasant break in your   day. This will make your life better with the exercises acting as a break in your routine that you can look forward to.   Perform all exercises about fifteen times, three times per day or as directed.  You should exercise both the operative leg and the other leg as well.  Exercises include:   Quad Sets - Tighten up the muscle on the front of the thigh (Quad) and hold for 5-10 seconds.   Straight Leg Raises - With your knee straight (if you were given a brace, keep it on), lift the leg to 60  degrees, hold for 3 seconds, and slowly lower the leg.  Perform this exercise against resistance later as your leg gets stronger.  Leg Slides: Lying on your back, slowly slide your foot toward your buttocks, bending your knee up off the floor (only go as far as is comfortable). Then slowly slide your foot back down until your leg is flat on the floor again.  Angel Wings: Lying on your back spread your legs to the side as far apart as you can without causing discomfort.  Hamstring Strength:  Lying on your back, push your heel against the floor with your leg straight by tightening up the muscles of your buttocks.  Repeat, but this time bend your knee to a comfortable angle, and push your heel against the floor.  You may put a pillow under the heel to make it more comfortable if necessary.   A rehabilitation program following joint replacement surgery can speed recovery and prevent re-injury in the future due to weakened muscles. Contact your doctor or a physical therapist for more information on knee rehabilitation.    CONSTIPATION  Constipation is defined medically as fewer than three stools per week and severe constipation as less than one stool per week.  Even if you have a regular bowel pattern at home, your normal regimen is likely to be disrupted due to multiple reasons following surgery.  Combination of anesthesia, postoperative narcotics, change in appetite and fluid intake all can affect your bowels.   YOU MUST use at least one of the following options; they are listed in order of increasing strength to get the job done.  They are all available over the counter, and you may need to use some, POSSIBLY even all of these options:    Drink plenty of fluids (prune juice may be helpful) and high fiber foods Colace 100 mg by mouth twice a day  Senokot for constipation as directed and as needed Dulcolax (bisacodyl), take with full glass of water  Miralax (polyethylene glycol) once or twice a day as  needed.  If you have tried all these things and are unable to have a bowel movement in the first 3-4 days after surgery call either your surgeon or your primary doctor.    If you experience loose stools or diarrhea, hold the medications until you stool forms back up.  If your symptoms do not get better within 1 week or if they get worse, check with your doctor.  If you experience "the worst abdominal pain ever" or develop nausea or vomiting, please contact the office immediately for further recommendations for treatment.   ITCHING:  If you experience itching with your medications, try taking only a single pain pill, or even half a pain pill at a time.  You can also use Benadryl over the counter for itching or also to help with sleep.   TED HOSE STOCKINGS:  Use stockings on both   legs until for at least 2 weeks or as directed by physician office. They may be removed at night for sleeping.  MEDICATIONS:  See your medication summary on the "After Visit Summary" that nursing will review with you.  You may have some home medications which will be placed on hold until you complete the course of blood thinner medication.  It is important for you to complete the blood thinner medication as prescribed.  PRECAUTIONS:  If you experience chest pain or shortness of breath - call 911 immediately for transfer to the hospital emergency department.   If you develop a fever greater that 101 F, purulent drainage from wound, increased redness or drainage from wound, foul odor from the wound/dressing, or calf pain - CONTACT YOUR SURGEON.                                                   FOLLOW-UP APPOINTMENTS:  If you do not already have a post-op appointment, please call the office for an appointment to be seen by your surgeon.  Guidelines for how soon to be seen are listed in your "After Visit Summary", but are typically between 1-4 weeks after surgery.  OTHER INSTRUCTIONS:   Knee Replacement:  Do not place pillow  under knee, focus on keeping the knee straight while resting. CPM instructions: 0-90 degrees, 2 hours in the morning, 2 hours in the afternoon, and 2 hours in the evening. Place foam block, curve side up under heel at all times except when in CPM or when walking.  DO NOT modify, tear, cut, or change the foam block in any way.  POST-OPERATIVE OPIOID TAPER INSTRUCTIONS: It is important to wean off of your opioid medication as soon as possible. If you do not need pain medication after your surgery it is ok to stop day one. Opioids include: Codeine, Hydrocodone(Norco, Vicodin), Oxycodone(Percocet, oxycontin) and hydromorphone amongst others.  Long term and even short term use of opiods can cause: Increased pain response Dependence Constipation Depression Respiratory depression And more.  Withdrawal symptoms can include Flu like symptoms Nausea, vomiting And more Techniques to manage these symptoms Hydrate well Eat regular healthy meals Stay active Use relaxation techniques(deep breathing, meditating, yoga) Do Not substitute Alcohol to help with tapering If you have been on opioids for less than two weeks and do not have pain than it is ok to stop all together.  Plan to wean off of opioids This plan should start within one week post op of your joint replacement. Maintain the same interval or time between taking each dose and first decrease the dose.  Cut the total daily intake of opioids by one tablet each day Next start to increase the time between doses. The last dose that should be eliminated is the evening dose.   MAKE SURE YOU:  Understand these instructions.  Get help right away if you are not doing well or get worse.    Thank you for letting us be a part of your medical care team.  It is a privilege we respect greatly.  We hope these instructions will help you stay on track for a fast and full recovery!      

## 2020-11-15 ENCOUNTER — Encounter (HOSPITAL_COMMUNITY): Payer: Self-pay | Admitting: Orthopaedic Surgery

## 2020-11-15 DIAGNOSIS — M1612 Unilateral primary osteoarthritis, left hip: Secondary | ICD-10-CM | POA: Diagnosis not present

## 2020-11-15 LAB — PREPARE RBC (CROSSMATCH)

## 2020-11-15 LAB — CBC WITH DIFFERENTIAL/PLATELET
Abs Immature Granulocytes: 0.05 10*3/uL (ref 0.00–0.07)
Basophils Absolute: 0 10*3/uL (ref 0.0–0.1)
Basophils Relative: 0 %
Eosinophils Absolute: 0 10*3/uL (ref 0.0–0.5)
Eosinophils Relative: 0 %
HCT: 22.6 % — ABNORMAL LOW (ref 39.0–52.0)
Hemoglobin: 7 g/dL — ABNORMAL LOW (ref 13.0–17.0)
Immature Granulocytes: 0 %
Lymphocytes Relative: 4 %
Lymphs Abs: 0.5 10*3/uL — ABNORMAL LOW (ref 0.7–4.0)
MCH: 27.3 pg (ref 26.0–34.0)
MCHC: 31 g/dL (ref 30.0–36.0)
MCV: 88.3 fL (ref 80.0–100.0)
Monocytes Absolute: 0.6 10*3/uL (ref 0.1–1.0)
Monocytes Relative: 4 %
Neutro Abs: 11.3 10*3/uL — ABNORMAL HIGH (ref 1.7–7.7)
Neutrophils Relative %: 92 %
Platelets: 260 10*3/uL (ref 150–400)
RBC: 2.56 MIL/uL — ABNORMAL LOW (ref 4.22–5.81)
RDW: 16.6 % — ABNORMAL HIGH (ref 11.5–15.5)
WBC: 12.4 10*3/uL — ABNORMAL HIGH (ref 4.0–10.5)
nRBC: 0 % (ref 0.0–0.2)

## 2020-11-15 LAB — BASIC METABOLIC PANEL
Anion gap: 13 (ref 5–15)
BUN: 34 mg/dL — ABNORMAL HIGH (ref 8–23)
CO2: 25 mmol/L (ref 22–32)
Calcium: 8 mg/dL — ABNORMAL LOW (ref 8.9–10.3)
Chloride: 97 mmol/L — ABNORMAL LOW (ref 98–111)
Creatinine, Ser: 7.64 mg/dL — ABNORMAL HIGH (ref 0.61–1.24)
GFR, Estimated: 7 mL/min — ABNORMAL LOW (ref >60)
Glucose, Bld: 103 mg/dL — ABNORMAL HIGH (ref 70–99)
Potassium: 4.8 mmol/L (ref 3.5–5.1)
Sodium: 135 mmol/L (ref 135–145)

## 2020-11-15 LAB — URIC ACID: Uric Acid, Serum: 4 mg/dL (ref 3.7–8.6)

## 2020-11-15 LAB — HEMOGLOBIN A1C
Hgb A1c MFr Bld: 4.8 % (ref 4.8–5.6)
Mean Plasma Glucose: 91.06 mg/dL

## 2020-11-15 MED ORDER — DARBEPOETIN ALFA 200 MCG/0.4ML IJ SOSY
200.0000 ug | PREFILLED_SYRINGE | INTRAMUSCULAR | Status: DC
  Administered 2020-11-16 – 2020-11-23 (×2): 200 ug via INTRAVENOUS
  Filled 2020-11-15 (×4): qty 0.4

## 2020-11-15 MED ORDER — CHLORHEXIDINE GLUCONATE CLOTH 2 % EX PADS
6.0000 | MEDICATED_PAD | Freq: Every day | CUTANEOUS | Status: DC
  Administered 2020-11-16 – 2020-11-25 (×6): 6 via TOPICAL

## 2020-11-15 MED ORDER — ATORVASTATIN CALCIUM 10 MG PO TABS
10.0000 mg | ORAL_TABLET | Freq: Every evening | ORAL | Status: DC
  Administered 2020-11-15 – 2020-11-25 (×11): 10 mg via ORAL
  Filled 2020-11-15 (×11): qty 1

## 2020-11-15 MED ORDER — SEVELAMER CARBONATE 800 MG PO TABS
1600.0000 mg | ORAL_TABLET | ORAL | Status: DC
  Administered 2020-11-15 – 2020-11-23 (×15): 1600 mg via ORAL
  Filled 2020-11-15 (×17): qty 2

## 2020-11-15 MED ORDER — CINACALCET HCL 30 MG PO TABS
120.0000 mg | ORAL_TABLET | Freq: Every day | ORAL | Status: DC
  Administered 2020-11-15 – 2020-11-25 (×11): 120 mg via ORAL
  Filled 2020-11-15 (×11): qty 4

## 2020-11-15 MED ORDER — SODIUM CHLORIDE 0.9% IV SOLUTION: Freq: Once | INTRAVENOUS | Status: DC

## 2020-11-15 MED ORDER — DOCUSATE SODIUM 100 MG PO CAPS: 100.0000 mg | ORAL_CAPSULE | Freq: Every day | ORAL | Status: DC | PRN

## 2020-11-15 MED ORDER — SEVELAMER CARBONATE 800 MG PO TABS
4000.0000 mg | ORAL_TABLET | Freq: Three times a day (TID) | ORAL | Status: DC
  Administered 2020-11-15 – 2020-11-25 (×23): 4000 mg via ORAL
  Filled 2020-11-15 (×24): qty 5

## 2020-11-15 MED ORDER — MIDODRINE HCL 5 MG PO TABS
10.0000 mg | ORAL_TABLET | ORAL | Status: DC
  Administered 2020-11-23 – 2020-11-25 (×2): 10 mg via ORAL
  Filled 2020-11-15 (×7): qty 2

## 2020-11-15 NOTE — Progress Notes (Addendum)
Subjective: 1 Day Post-Op Procedure(s) (LRB): LEFT TOTAL HIP ARTHROPLASTY ANTERIOR APPROACH (Left) Patient reports pain as mild.  Denies nausea/vomiting, lightheadedness/dizziness, chest pain/pressure/palpitations. Vital sighs stable overnight  Objective: Vital signs in last 24 hours: Temp:  [97.5 F (36.4 C)-97.8 F (36.6 C)] 97.5 F (36.4 C) (10/03 2011) Pulse Rate:  [73-106] 98 (10/03 2054) Resp:  [11-19] 18 (10/03 2011) BP: (108-131)/(74-93) 108/74 (10/03 2011) SpO2:  [98 %-100 %] 100 % (10/03 2011) Weight:  [88.1 kg] 88.1 kg (10/03 1357)  Intake/Output from previous day: 10/03 0701 - 10/04 0700 In: 961.9 [P.O.:120; I.V.:603.8; IV Piggyback:238.2] Out: 750 [Blood:750] Intake/Output this shift: No intake/output data recorded.  Recent Labs    11/14/20 1431 11/15/20 0033  HGB 9.2* 7.0*   Recent Labs    11/14/20 1431 11/15/20 0033  WBC  --  12.4*  RBC  --  2.56*  HCT 27.0* 22.6*  PLT  --  260   Recent Labs    11/14/20 1431 11/15/20 0033  NA 137 135  K 3.8 4.8  CL 98 97*  CO2  --  25  BUN 29* 34*  CREATININE 7.50* 7.64*  GLUCOSE 70 103*  CALCIUM  --  8.0*   No results for input(s): LABPT, INR in the last 72 hours.  Neurologically intact Neurovascular intact Intact pulses distally Dorsiflexion/Plantar flexion intact Incision: dressing C/D/I No cellulitis present Compartment soft   Assessment/Plan: 1 Day Post-Op Procedure(s) (LRB): LEFT TOTAL HIP ARTHROPLASTY ANTERIOR APPROACH (Left) Advance diet Up with therapy WBAT LLE ABLA on chronic anemia- 7.0 this am.  Patient asymptomatic.  Will continue to follow and will not transfuse unless life-threatening.   Dialysis Monday/Wednesday/Friday.  Was dialyzed yesterday. D/c dispo pending mobility with PT and help at home.  Currently, will not have help until Thursday Continue wound vac- just prior to d/c, please swap out the house unit for the portable prevena F/u with Dr. Erlinda Hong one week after d/c for wound  vac removal      Aundra Dubin 11/15/2020, 7:46 AM

## 2020-11-15 NOTE — Progress Notes (Signed)
Physical Therapy Treatment Patient Details Name: Carl Hoge Sr. MRN: [id number] DOB: [birth year] Today's Date: 11/15/2020   History of Present Illness Patient is a 60 y/o male who presents s/p left THA 11/14/20 direct anterior approach. PMH includes TIA, ESRD, DM, HTN, Rt TSA, depression, anxiety, stroke, seizures.    PT Comments    Patient progressing slowly this afternoon. Tolerated gait training with Min A and use of RW for support. Pt noted to have left knee instability during stance phase. Not supposed to be placing more than 5lbs through RUE due to recent TSA. Reports he has been doing his exercises throughout the day. Will continue to follow and progress as tolerated.    Recommendations for follow up therapy are one component of a multi-disciplinary discharge planning process, led by the attending physician.  Recommendations may be updated based on patient status, additional functional criteria and insurance authorization.  Follow Up Recommendations  Follow surgeon's recommendation for DC plan and follow-up therapies;Supervision for mobility/OOB     Equipment Recommendations  Rolling walker with 5" wheels    Recommendations for Other Services       Precautions / Restrictions Precautions Precautions: Other (comment);Fall Precaution Comments: wound vac Restrictions Weight Bearing Restrictions: Yes LLE Weight Bearing: Weight bearing as tolerated     Mobility  Bed Mobility Overal bed mobility: Needs Assistance Bed Mobility: Sit to Supine       Sit to supine: Supervision;HOB elevated   General bed mobility comments: Able to use UEs to bring LLE into bed to return to supine.    Transfers Overall transfer level: Needs assistance Equipment used: Rolling walker (2 wheeled) Transfers: Sit to/from Stand Sit to Stand: Min assist         General transfer comment: Min A to steady in standing once upright. Stood from Youth worker.  Ambulation/Gait Ambulation/Gait  assistance: Min assist Gait Distance (Feet): 15 Feet Assistive device: Rolling walker (2 wheeled) Gait Pattern/deviations: Decreased stance time - left;Step-through pattern;Narrow base of support;Decreased step length - left;Trunk flexed Gait velocity: decreased   General Gait Details: Slow, unsteady gait with increased knee flexion/instability on left during stance phase but able to extend knee with cues to prevent buckling. Very slow and increased WB through UEs (not supposed to place more than 5lb through RUE due to TSA).   Stairs             Wheelchair Mobility    Modified Rankin (Stroke Patients Only)       Balance Overall balance assessment: Needs assistance Sitting-balance support: Feet supported;No upper extremity supported Sitting balance-Leahy Scale: Good     Standing balance support: During functional activity Standing balance-Leahy Scale: Poor Standing balance comment: Requires UE support in standing.                            Cognition Arousal/Alertness: Awake/alert Behavior During Therapy: WFL for tasks assessed/performed Overall Cognitive Status: Within Functional Limits for tasks assessed                                        Exercises      General Comments        Pertinent Vitals/Pain Pain Assessment: Faces Faces Pain Scale: Hurts even more Pain Location: left knee/hip Pain Descriptors / Indicators: Sore;Aching;Discomfort;Guarding Pain Intervention(s): Monitored during session;Repositioned;Premedicated before session    Home Living  Prior Function            PT Goals (current goals can now be found in the care plan section) Progress towards PT goals: Progressing toward goals    Frequency    7X/week      PT Plan Current plan remains appropriate    Co-evaluation              AM-PAC PT "6 Clicks" Mobility   Outcome Measure  Help needed turning from your back to  your side while in a flat bed without using bedrails?: A Little Help needed moving from lying on your back to sitting on the side of a flat bed without using bedrails?: A Little Help needed moving to and from a bed to a chair (including a wheelchair)?: A Little Help needed standing up from a chair using your arms (e.g., wheelchair or bedside chair)?: A Little Help needed to walk in hospital room?: A Little Help needed climbing 3-5 steps with a railing? : A Lot 6 Click Score: 17    End of Session Equipment Utilized During Treatment: Gait belt Activity Tolerance: Patient tolerated treatment well Patient left: in bed;with call bell/phone within reach;with bed alarm set;with nursing/sitter in room Nurse Communication: Mobility status PT Visit Diagnosis: Pain;Muscle weakness (generalized) (M62.81);Difficulty in walking, not elsewhere classified (R26.2) Pain - Right/Left: Left Pain - part of body: Knee;Hip     Time: 1334-1400 PT Time Calculation (min) (ACUTE ONLY): 26 min  Charges:  $Gait Training: 23-37 mins                     Marisa Severin, PT, DPT Acute Rehabilitation Services Pager [phone] Office [place] 11/15/2020, 3:16 PM

## 2020-11-15 NOTE — Progress Notes (Signed)
PROGRESS NOTE    Carl Mayol Sr.  ZMO:[id number] DOB: 09/16/1960 DOA: 11/14/2020 PCP: [name], MD   Brief Narrative:  60-year with history of ESRD on HD Monday Wednesday Friday, HTN, DM2, TIA, lacunar CVA, GERD, anemia of chronic disease, HLD, OSA, osteoarthritis of the hip admitted for hip replacement.  Medical team consulted for medication management.   Assessment & Plan:   Principal Problem:   Primary osteoarthritis of left hip Active Problems:   GERD (gastroesophageal reflux disease)   History of lacunar cerebrovascular accident (CVA)   Anemia of renal disease   HLD (hyperlipidemia)   Essential hypertension   ESRD on hemodialysis ([facility])   Obstructive sleep apnea   Type 2 diabetes mellitus with other specified complication (HCC)   Status post total replacement of left hip  Primary osteoarthritis of left hip s/p total hip arthroplasty-today  -Status post left hip total arthroplasty.  Pain management and postop DVT prophylaxis per orthopedic   Acute left ankle pain -Some soft tissue swelling.  No evidence of infection.  Uric acid levels are normal.  Continue to monitor, pain control.  Orthopedic follow-up    ESRD on hemodialysis ([facility]) -HD MWF.  Nephrology following.   Type 2 diabetes mellitus with other specified complication (HCC) -Diet controlled.  A1c-4.8.     GERD (gastroesophageal reflux disease) -PPI     History of lacunar cerebrovascular accident (CVA) -Resume aspirin when cleared by surgery.  Statin     Anemia of renal disease Acute blood loss anemia Patient's baseline hemoglobin is around 9.0, this morning it is trended down to 7.0.  Continue to monitor, if continues to drift down he will need transfusion with dialysis.     HLD (hyperlipidemia) -We will resume Lipitor     Essential hypertension -Typically patient gets midodrine prior to his dialysis sessions     Obstructive sleep apnea -Not on home CPAP.   I have reviewed patient's med rec  this morning.  Appropriate medications have been resumed while patient is in the hospital.    DVT prophylaxis: Per surgery Code Status: Full code     Subjective: Seen and examined at bedside, he was sitting up in the recliner.  Was still having some left lower extremity numbness from anesthesia/nerve block but overall no complaints at this time.  Review of Systems Otherwise negative except as per HPI, including: General: Denies fever, chills, night sweats or unintended weight loss. Resp: Denies cough, wheezing, shortness of breath. Cardiac: Denies chest pain, palpitations, orthopnea, paroxysmal nocturnal dyspnea. GI: Denies abdominal pain, nausea, vomiting, diarrhea or constipation GU: Denies dysuria, frequency, hesitancy or incontinence MS: Denies muscle aches, joint pain or swelling Neuro: Denies headache, neurologic deficits (focal weakness, numbness, tingling), abnormal gait Psych: Denies anxiety, depression, SI/HI/AVH Skin: Denies new rashes or lesions ID: Denies sick contacts, exotic exposures, travel  Examination:  General exam: Appears calm and comfortable  Respiratory system: Clear to auscultation. Respiratory effort normal. Cardiovascular system: S1 & S2 heard, RRR. No JVD, murmurs, rubs, gallops or clicks. No pedal edema. Gastrointestinal system: Abdomen is nondistended, soft and nontender. No organomegaly or masses felt. Normal bowel sounds heard. Central nervous system: Alert and oriented. No focal neurological deficits. Extremities: Symmetric 5 x 5 power. Skin: Surgical dressing intact with no obvious evidence of bleeding or swelling noted. Psychiatry: Judgement and insight appear normal. Mood & affect appropriate.     Objective: Vitals:   11/14/20 1910 11/14/20 1925 11/14/20 2011 11/14/20 2054  BP: 113/79 113/85 108/74   Pulse: 95  100 (!) 104 98  Resp: 13 17 18    Temp:  97.7 F (36.5 C) (!) 97.5 F (36.4 C)   TempSrc:      SpO2: 99% 100% 100%   Weight:       Height:        Intake/Output Summary (Last 24 hours) at 11/15/2020 0830 Last data filed at 11/15/2020 0645 Gross per 24 hour  Intake 961.94 ml  Output 750 ml  Net 211.94 ml   Filed Weights   11/14/20 1357  Weight: 88.1 kg     Data Reviewed:   CBC: Recent Labs  Lab 11/10/20 0930 11/14/20 1431 11/15/20 0033  WBC 5.7  --  12.4*  NEUTROABS 4.0  --  11.3*  HGB 8.5* 9.2* 7.0*  HCT 28.4* 27.0* 22.6*  MCV 90.2  --  88.3  PLT 327  --  073   Basic Metabolic Panel: Recent Labs  Lab 11/10/20 0930 11/14/20 1431 11/15/20 0033  NA 138 137 135  K 3.5 3.8 4.8  CL 98 98 97*  CO2 26  --  25  GLUCOSE 149* 70 103*  BUN 28* 29* 34*  CREATININE 7.22* 7.50* 7.64*  CALCIUM 9.6  --  8.0*   GFR: Estimated Creatinine Clearance: 11.3 mL/min (A) (by C-G formula based on SCr of 7.64 mg/dL (H)). Liver Function Tests: Recent Labs  Lab 11/10/20 0930  AST 17  ALT 11  ALKPHOS 70  BILITOT 0.5  PROT 7.8  ALBUMIN 2.7*   No results for input(s): LIPASE, AMYLASE in the last 168 hours. No results for input(s): AMMONIA in the last 168 hours. Coagulation Profile: Recent Labs  Lab 11/10/20 0930  INR 1.2   Cardiac Enzymes: No results for input(s): CKTOTAL, CKMB, CKMBINDEX, TROPONINI in the last 168 hours. BNP (last 3 results) No results for input(s): PROBNP in the last 8760 hours. HbA1C: Recent Labs    11/15/20 0033  HGBA1C 4.8   CBG: No results for input(s): GLUCAP in the last 168 hours. Lipid Profile: No results for input(s): CHOL, HDL, LDLCALC, TRIG, CHOLHDL, LDLDIRECT in the last 72 hours. Thyroid Function Tests: No results for input(s): TSH, T4TOTAL, FREET4, T3FREE, THYROIDAB in the last 72 hours. Anemia Panel: No results for input(s): VITAMINB12, FOLATE, FERRITIN, TIBC, IRON, RETICCTPCT in the last 72 hours. Sepsis Labs: No results for input(s): PROCALCITON, LATICACIDVEN in the last 168 hours.  Recent Results (from the past 240 hour(s))  Surgical pcr screen      Status: None   Collection Time: 11/10/20  8:59 AM   Specimen: Nasal Mucosa; Nasal Swab  Result Value Ref Range Status   MRSA, PCR NEGATIVE NEGATIVE Final   Staphylococcus aureus NEGATIVE NEGATIVE Final    Comment: (NOTE) The Xpert SA Assay (FDA approved for NASAL specimens in patients 61 years of age and older), is one component of a comprehensive surveillance program. It is not intended to diagnose infection nor to guide or monitor treatment. Performed at [facility], [place] [address], [place], [place] [address]   SARS CORONAVIRUS 2 (TAT 6-24 HRS) Nasopharyngeal Nasopharyngeal Swab     Status: None   Collection Time: 11/10/20  9:00 AM   Specimen: Nasopharyngeal Swab  Result Value Ref Range Status   SARS Coronavirus 2 NEGATIVE NEGATIVE Final    Comment: (NOTE) SARS-CoV-2 target nucleic acids are NOT DETECTED.  The SARS-CoV-2 RNA is generally detectable in upper and lower respiratory specimens during the acute phase of infection. Negative results do not preclude SARS-CoV-2 infection,  do not rule out co-infections with other pathogens, and should not be used as the sole basis for treatment or other patient management decisions. Negative results must be combined with clinical observations, patient history, and epidemiological information. The expected result is Negative.  Fact Sheet for Patients: SugarRoll.be  Fact Sheet for Healthcare Providers: https://www.woods-mathews.com/  This test is not yet approved or cleared by the [place] FDA and  has been authorized for detection and/or diagnosis of SARS-CoV-2 by FDA under an Emergency Use Authorization (EUA). This EUA will remain  in effect (meaning this test can be used) for the duration of the COVID-19 declaration under Se ction 564(b)(1) of the Act, 21 U.S.C. section 360bbb-3(b)(1), unless the authorization is terminated or revoked sooner.  Performed at [facility], [place] [address] [place], [place] [address]          Radiology Studies: DG Pelvis Portable  Result Date: 11/14/2020 CLINICAL DATA:  Status post left arthroplasty EXAM: PORTABLE PELVIS 1 VIEWS COMPARISON:  Films from earlier in the same day. FINDINGS: Pelvic ring is intact. Left hip replacement is now seen in satisfactory position. No acute bony or soft tissue abnormality is noted. Vascular calcifications are seen. IMPRESSION: Status post left hip replacement Electronically Signed   By: Inez Catalina M.D.   On: 11/14/2020 19:04   DG Ankle Left Port  Result Date: 11/14/2020 CLINICAL DATA:  Ankle pain EXAM: PORTABLE LEFT ANKLE - 2 VIEW COMPARISON:  None. FINDINGS: Soft tissue swelling is present. Age indeterminate avulsions at the tips of the medial and lateral malleoli. Ankle mortise is symmetric. Vascular calcifications. IMPRESSION: Soft tissue swelling. Age indeterminate avulsions off the tips of the medial and lateral malleoli Electronically Signed   By: Donavan Foil M.D.   On: 11/14/2020 19:42   DG C-Arm 1-60 Min-No Report  Result Date: 11/14/2020 Fluoroscopy was utilized by the requesting physician.  No radiographic interpretation.   DG C-Arm 1-60 Min-No Report  Result Date: 11/14/2020 Fluoroscopy was utilized by the requesting physician.  No radiographic interpretation.   DG HIP OPERATIVE UNILAT WITH PELVIS LEFT  Result Date: 11/14/2020 CLINICAL DATA:  Left hip replacement EXAM: OPERATIVE LEFT HIP WITH PELVIS COMPARISON:  05/19/2020 FLUOROSCOPY TIME:  Radiation Exposure Index (as provided by the fluoroscopic device): 1.98 mGy If the device does not provide the exposure index: Fluoroscopy Time:  18 seconds Number of Acquired Images:  4 FINDINGS: Initial images demonstrate significant destruction of the left femoral head progressed in the interval from the prior exam. Subsequent images demonstrate removal of the femoral neck and femoral head with placement of left hip prosthesis. No  acute bony or soft tissue abnormality is seen. Lucent lesion is again noted in the region of the right femoral neck and head. IMPRESSION: Left hip arthroplasty Electronically Signed   By: Inez Catalina M.D.   On: 11/14/2020 19:05        Scheduled Meds:  acetaminophen  1,000 mg Oral Q6H   aspirin  81 mg Oral BID   dexamethasone (DECADRON) injection  10 mg Intravenous Once   docusate sodium  100 mg Oral BID   oxyCODONE  10 mg Oral Q12H   pantoprazole  40 mg Oral Q2200   polyethylene glycol  17 g Oral Daily   topiramate  25 mg Oral BID   Continuous Infusions:  sodium chloride Stopped (11/15/20 0814)   methocarbamol (ROBAXIN) IV     tranexamic acid       LOS: 0 days  Time spent= 35 mins    Cattleya Dobratz Arsenio Loader, MD [facility]  If 7PM-7AM, please contact night-coverage  11/15/2020, 8:30 AM

## 2020-11-15 NOTE — Anesthesia Postprocedure Evaluation (Signed)
Anesthesia Post Note  Patient: Carl Irani Sr.  Procedure(s) Performed: LEFT TOTAL HIP ARTHROPLASTY ANTERIOR APPROACH (Left: Hip)     Patient location during evaluation: PACU Anesthesia Type: General Level of consciousness: awake and alert Pain management: pain level controlled Vital Signs Assessment: post-procedure vital signs reviewed and stable Respiratory status: spontaneous breathing, nonlabored ventilation, respiratory function stable and patient connected to nasal cannula oxygen Cardiovascular status: blood pressure returned to baseline and stable Postop Assessment: no apparent nausea or vomiting Anesthetic complications: no   No notable events documented.  Last Vitals:  Vitals:   11/14/20 2011 11/14/20 2054  BP: 108/74   Pulse: (!) 104 98  Resp: 18   Temp: (!) 36.4 C   SpO2: 100%     Last Pain:  Vitals:   11/15/20 0726  TempSrc:   PainSc: 0-No pain                 Effie Berkshire

## 2020-11-15 NOTE — Consult Note (Signed)
[facility] Renal Consultation Note    Indication for Consultation:  Management of ESRD/hemodialysis, anemia, hypertension/volume, and secondary hyperparathyroidism. PCP:  HPI: Carl Mullaly Sr. is a 60 y.o. male with ESRD, T2DM, HTN, Hx B nephrectomies, Hx CVA who was admitted s/p elective LEFT total hip arthroplasty on 11/14/20.  Seen in room, has already been up walking with PT. Denies CP, dyspnea, nausea, vomiting, fever, chills. He dialyzed yesterday morning prior to his surgery without issues. Labs today with K 4.8, Ca 8, WBC 12.4, Hgb 7. COVID test was done in pre-op on 9/29 and was negative.  Dialyzes at [facility] on MWF schedule - due for dialysis tomorrow.  Past Medical History:  Diagnosis Date   Acute gastric ulcer with hemorrhage    Acute GI bleeding 07/31/2019   Acute pericarditis    Anemia    hx of   Anxiety    situational    Arthritis    on meds   Borderline diabetes    Cataract    bilateral sx   Chronic headaches    Depression    situational    Diverticulitis    ESRD (end stage renal disease) ([place])     On Renal Transplant List," [facility]; MWF" (10/23/2016)   GERD (gastroesophageal reflux disease)    with certain foods   GI bleed    Hypertension    diet controlled   Parathyroid abnormality ([place])    ectopic parathyroid gland   Presence of arteriovenous fistula for hemodialysis, primary ([place])    RUE   Refusal of blood product    NO WHOLE BLOOD PROUCTS   Renal cell carcinoma ([place])    s/p hand assisted laparoscopic bilateral nephrectomies 11/29/17, + RCC left   Secondary hyperparathyroidism ([place])    Seizures ([place])    one episode in past, due to" elevated Potassium" 08/02/20- "at least 4 years ago"   Sleep apnea    doesn't use CPAP anymore since weight loss   Stroke [facility])    no residual   Past Surgical History:  Procedure Laterality Date   AV FISTULA PLACEMENT Right    right arm   BIOPSY  08/01/2019   Procedure: BIOPSY;  Surgeon: Juanita Craver, MD;  Location: [facility] ENDOSCOPY;  Service: Endoscopy;;   CATARACT EXTRACTION W/ INTRAOCULAR LENS  IMPLANT, BILATERAL     COLON SURGERY     COLONOSCOPY N/A 08/04/2015   Procedure: COLONOSCOPY;  Surgeon: Jerene Bears, MD;  Location: [facility] ENDOSCOPY;  Service: Endoscopy;  Laterality: N/A;   COLONOSCOPY  2017   JMP@ [facility]-good prep-mass -recall 1 yr   ESOPHAGOGASTRODUODENOSCOPY N/A 08/01/2019   Procedure: ESOPHAGOGASTRODUODENOSCOPY (EGD);  Surgeon: Juanita Craver, MD;  Location: [facility] ENDOSCOPY;  Service: Endoscopy;  Laterality: N/A;   ESOPHAGOGASTRODUODENOSCOPY (EGD) WITH PROPOFOL N/A 08/04/2019   Procedure: ESOPHAGOGASTRODUODENOSCOPY (EGD) WITH PROPOFOL;  Surgeon: Yetta Flock, MD;  Location: [place];  Service: Gastroenterology;  Laterality: N/A;   graft left arm Left    for dialysis x 2. Removed   INSERTION OF DIALYSIS CATHETER     Rt chest   LAPAROSCOPIC RIGHT COLECTOMY N/A 08/05/2015   Procedure: LAPAROSCOPIC RIGHT COLECTOMY- ASCENDING;  Surgeon: Stark Klein, MD;  Location: [place];  Service: General;  Laterality: N/A;   MASS EXCISION Left 05/28/2019   Procedure: EXCISION SOFT TISSUE MASS LEFT SHOULDER;  Surgeon: Armandina Gemma, MD;  Location: [facility] ORS;  Service: General;  Laterality: Left;   NEPHRECTOMY Bilateral    PARATHYROIDECTOMY N/A 06/12/2016   Procedure: TOTAL PARATHYROIDECTOMY WITH  AUTOTRANSPLANTATION TO LEFT FOREARM;  Surgeon: Armandina Gemma, MD;  Location: [place];  Service: General;  Laterality: N/A;   PARATHYROIDECTOMY N/A 10/23/2016   Procedure: PARATHYROIDECTOMY;  Surgeon: Armandina Gemma, MD;  Location: [place];  Service: General;  Laterality: N/A;   REVERSE SHOULDER ARTHROPLASTY Right 08/24/2020   Procedure: REVERSE SHOULDER ARTHROPLASTY;  Surgeon: Hiram Gash, MD;  Location: [place];  Service: Orthopedics;  Laterality: Right;   REVISON OF ARTERIOVENOUS FISTULA Right 07/16/2017   Procedure: REVISION OF ARTERIOVENOUS FISTULA  Right ARM;  Surgeon: Waynetta Sandy, MD;   Location: [place];  Service: Vascular;  Laterality: Right;   UPPER GASTROINTESTINAL ENDOSCOPY  2021   @ [facility]   Family History  Problem Relation Age of Onset   Diabetes Father    Stroke Father    Hypertension Father    Uterine cancer Mother    Lupus Sister    Stroke Sister    Hypertension Sister    Anuerysm Brother        brain   Colon cancer Neg Hx    Esophageal cancer Neg Hx    Stomach cancer Neg Hx    Pancreatic cancer Neg Hx    Liver disease Neg Hx    Colon polyps Neg Hx    Rectal cancer Neg Hx    Social History:  reports that he quit smoking about 30 years ago. His smoking use included cigarettes. He has never used smokeless tobacco. He reports current alcohol use of about 1.0 standard drink per week. He reports that he does not currently use drugs after having used the following drugs: Marijuana.  ROS: As per HPI otherwise negative.  Physical Exam: Vitals:   11/14/20 1910 11/14/20 1925 11/14/20 2011 11/14/20 2054  BP: 113/79 113/85 108/74   Pulse: 95 100 (!) 104 98  Resp: 13 17 18    Temp:  97.7 F (36.5 C) (!) 97.5 F (36.4 C)   TempSrc:      SpO2: 99% 100% 100%   Weight:      Height:         General: Well developed, well nourished, in no acute distress. Room air. Head: Normocephalic, atraumatic, sclera non-icteric, mucus membranes are moist. Neck: Supple without lymphadenopathy/masses. JVD not elevated. Lungs: Clear bilaterally to auscultation without wheezes, rales, or rhonchi. Breathing is unlabored. Heart: RRR with normal S1, S2. No murmurs, rubs, or gallops appreciated. Abdomen: Soft, non-tender, non-distended with normoactive bowel sounds. No rebound/guarding. Musculoskeletal:  Strength and tone appear normal for age. Wound vac to L hip - wound not examined. Lower extremities: No edema or ischemic changes, no open wounds. Neuro: Alert and oriented X 3. Moves all extremities spontaneously. Psych:  Responds to questions appropriately with a normal  affect. Dialysis Access: R AVF + bruit  Allergies  Allergen Reactions   Infed [Iron Dextran] Other (See Comments)    Decreased BP    Oxycodone Nausea Only   Vicodin [Hydrocodone-Acetaminophen] Other (See Comments)    Decreased BP   Whole Blood Other (See Comments)    Patient refuses whole blood "unless I am going to die"   Hydrocodone-Acetaminophen     Other reaction(s): lower BP   Diclofenac Other (See Comments)    Unknown   Prior to Admission medications   Medication Sig Start Date End Date Taking? Authorizing Provider  aspirin EC 81 MG tablet Take 1 tablet (81 mg total) by mouth 2 (two) times daily. To be taken after surgery Patient taking differently: Take 81 mg by  mouth every other day. 11/08/20  Yes Aundra Dubin, PA-C  atorvastatin (LIPITOR) 10 MG tablet Take 10 mg by mouth every evening.   Yes [provider]  B Complex-C-Folic Acid (DIALYVITE PO) Take 1 tablet by mouth daily.   Yes [provider]  cinacalcet (SENSIPAR) 60 MG tablet Take 120 mg by mouth daily with supper.   Yes [provider]  cromolyn (OPTICROM) 4 % ophthalmic solution Place 1 drop into both eyes daily as needed (redness).   Yes [provider]  ibuprofen (ADVIL) 800 MG tablet Take 800 mg by mouth 3 (three) times daily as needed for moderate pain.   Yes [provider]  LOKELMA 10 g PACK packet Take 1 packet by mouth daily. 03/11/19  Yes [provider]  midodrine (PROAMATINE) 10 MG tablet Take 10 mg by mouth See admin instructions. Take 10 mg by mouth three times a week as directed, bring to dialysis. May need an additional tablet during treatment.   Yes [provider]  omeprazole (PRILOSEC) 20 MG capsule Take 40 mg by mouth in the morning and at bedtime.   Yes [provider]  sevelamer carbonate (RENVELA) 800 MG tablet Take 1,600-4,000 mg by mouth See admin instructions. Take 4000 mg by mouth three times daily with meals and 1600-2400  mg with snacks 10/11/20  Yes [provider]  topiramate (TOPAMAX) 25 MG tablet Take 1 tablet (25 mg total) by mouth 2 (two) times daily. 03/08/20  Yes McCue, Janett Billow, NP  Turmeric (QC TUMERIC COMPLEX PO) Take 1 capsule by mouth daily. 08/06/19  Yes [provider]  vitamin E 180 MG (400 UNITS) capsule Take 400 Units by mouth daily.   Yes [provider]  cephALEXin (KEFLEX) 500 MG capsule Take 1 capsule (500 mg total) by mouth 4 (four) times daily. To be taken after surgery 11/08/20   Aundra Dubin, PA-C  docusate sodium (COLACE) 100 MG capsule Take 1 capsule (100 mg total) by mouth daily as needed. Patient taking differently: Take 100 mg by mouth in the morning and at bedtime. 11/08/20 11/08/21  Aundra Dubin, PA-C  gabapentin (NEURONTIN) 100 MG capsule Take 1 capsule (100 mg total) by mouth 3 (three) times daily for 14 days. For pain Patient not taking: Reported on 11/09/2020 08/25/20 09/08/20  Ethelda Chick, PA-C  methocarbamol (ROBAXIN) 500 MG tablet Take 1 tablet (500 mg total) by mouth 2 (two) times daily as needed. To be taken after surgery 11/08/20   Aundra Dubin, PA-C  ondansetron (ZOFRAN) 4 MG tablet Take 1 tablet (4 mg total) by mouth every 8 (eight) hours as needed for nausea or vomiting. 11/08/20   Aundra Dubin, PA-C  oxyCODONE-acetaminophen (PERCOCET) 5-325 MG tablet Take 1-2 tablets by mouth every 6 (six) hours as needed. To be taken after surgery 11/08/20   Aundra Dubin, PA-C   Current Facility-Administered Medications  Medication Dose Route Frequency Provider Last Rate Last Admin   0.9 %  sodium chloride infusion   Intravenous Continuous Leandrew Koyanagi, MD   Stopped at 11/15/20 1610   acetaminophen (TYLENOL) tablet 1,000 mg  1,000 mg Oral Q6H Leandrew Koyanagi, MD   1,000 mg at 11/15/20 0631   acetaminophen (TYLENOL) tablet 325-650 mg  325-650 mg Oral Q6H PRN Leandrew Koyanagi, MD       alum & mag hydroxide-simeth (MAALOX/MYLANTA) 200-200-20 MG/5ML  suspension 30 mL  30 mL Oral Q4H PRN Leandrew Koyanagi, MD  aspirin chewable tablet 81 mg  81 mg Oral BID Leandrew Koyanagi, MD   81 mg at 11/15/20 8144   atorvastatin (LIPITOR) tablet 10 mg  10 mg Oral QPM Amin, Ankit Chirag, MD       cinacalcet (SENSIPAR) tablet 120 mg  120 mg Oral Q supper Amin, Ankit Chirag, MD       diphenhydrAMINE (BENADRYL) 12.5 MG/5ML elixir 25 mg  25 mg Oral Q4H PRN Leandrew Koyanagi, MD       docusate sodium (COLACE) capsule 100 mg  100 mg Oral BID Leandrew Koyanagi, MD   100 mg at 11/15/20 8185   docusate sodium (COLACE) capsule 100 mg  100 mg Oral Daily PRN Amin, Ankit Chirag, MD       HYDROmorphone (DILAUDID) injection 0.5-1 mg  0.5-1 mg Intravenous Q4H PRN Leandrew Koyanagi, MD       menthol-cetylpyridinium (CEPACOL) lozenge 3 mg  1 lozenge Oral PRN Leandrew Koyanagi, MD       Or   phenol (CHLORASEPTIC) mouth spray 1 spray  1 spray Mouth/Throat PRN Leandrew Koyanagi, MD       methocarbamol (ROBAXIN) tablet 500 mg  500 mg Oral Q6H PRN Leandrew Koyanagi, MD       Or   methocarbamol (ROBAXIN) 500 mg in dextrose 5 % 50 mL IVPB  500 mg Intravenous Q6H PRN Leandrew Koyanagi, MD       metoCLOPramide (REGLAN) tablet 5-10 mg  5-10 mg Oral Q8H PRN Leandrew Koyanagi, MD       Or   metoCLOPramide (REGLAN) injection 5-10 mg  5-10 mg Intravenous Q8H PRN Leandrew Koyanagi, MD       Derrill Memo ON 11/16/2020] midodrine (PROAMATINE) tablet 10 mg  10 mg Oral Q M,W,F Amin, Ankit Chirag, MD       ondansetron (ZOFRAN) tablet 4 mg  4 mg Oral Q6H PRN Leandrew Koyanagi, MD       Or   ondansetron [facility]) injection 4 mg  4 mg Intravenous Q6H PRN Leandrew Koyanagi, MD       oxyCODONE (Oxy IR/ROXICODONE) immediate release tablet 10-15 mg  10-15 mg Oral Q4H PRN Leandrew Koyanagi, MD       oxyCODONE (Oxy IR/ROXICODONE) immediate release tablet 5-10 mg  5-10 mg Oral Q4H PRN Leandrew Koyanagi, MD   10 mg at 11/15/20 0401   oxyCODONE (OXYCONTIN) 12 hr tablet 10 mg  10 mg Oral Q12H Leandrew Koyanagi, MD   10 mg at 11/15/20 0902   pantoprazole (PROTONIX) EC  tablet 40 mg  40 mg Oral Q2200 Leandrew Koyanagi, MD   40 mg at 11/14/20 2232   polyethylene glycol (MIRALAX / GLYCOLAX) packet 17 g  17 g Oral Daily Leandrew Koyanagi, MD   17 g at 11/15/20 0902   sevelamer carbonate (RENVELA) tablet 1,600 mg  1,600 mg Oral With snacks Levada Dy, Dwayne A, RPH   1,600 mg at 11/15/20 1037   sevelamer carbonate (RENVELA) tablet 4,000 mg  4,000 mg Oral TID with meals Amin, Ankit Chirag, MD   4,000 mg at 11/15/20 0911   sorbitol 70 % solution 30 mL  30 mL Oral Daily PRN Leandrew Koyanagi, MD       topiramate (TOPAMAX) tablet 25 mg  25 mg Oral BID Leandrew Koyanagi, MD   25 mg at 11/15/20 0902   tranexamic acid (CYKLOKAPRON) IVPB 1,000 mg  1,000 mg Intravenous Once Xu, Naiping M,  MD       Labs: Basic Metabolic Panel: Recent Labs  Lab 11/10/20 0930 11/14/20 1431 11/15/20 0033  NA 138 137 135  K 3.5 3.8 4.8  CL 98 98 97*  CO2 26  --  25  GLUCOSE 149* 70 103*  BUN 28* 29* 34*  CREATININE 7.22* 7.50* 7.64*  CALCIUM 9.6  --  8.0*   Liver Function Tests: Recent Labs  Lab 11/10/20 0930  AST 17  ALT 11  ALKPHOS 70  BILITOT 0.5  PROT 7.8  ALBUMIN 2.7*   CBC: Recent Labs  Lab 11/10/20 0930 11/14/20 1431 11/15/20 0033  WBC 5.7  --  12.4*  NEUTROABS 4.0  --  11.3*  HGB 8.5* 9.2* 7.0*  HCT 28.4* 27.0* 22.6*  MCV 90.2  --  88.3  PLT 327  --  260   Studies/Results: DG Pelvis Portable  Result Date: 11/14/2020 CLINICAL DATA:  Status post left arthroplasty EXAM: PORTABLE PELVIS 1 VIEWS COMPARISON:  Films from earlier in the same day. FINDINGS: Pelvic ring is intact. Left hip replacement is now seen in satisfactory position. No acute bony or soft tissue abnormality is noted. Vascular calcifications are seen. IMPRESSION: Status post left hip replacement Electronically Signed   By: Inez Catalina M.D.   On: 11/14/2020 19:04   DG Ankle Left Port  Result Date: 11/14/2020 CLINICAL DATA:  Ankle pain EXAM: PORTABLE LEFT ANKLE - 2 VIEW COMPARISON:  None. FINDINGS: Soft tissue  swelling is present. Age indeterminate avulsions at the tips of the medial and lateral malleoli. Ankle mortise is symmetric. Vascular calcifications. IMPRESSION: Soft tissue swelling. Age indeterminate avulsions off the tips of the medial and lateral malleoli Electronically Signed   By: Donavan Foil M.D.   On: 11/14/2020 19:42   DG C-Arm 1-60 Min-No Report  Result Date: 11/14/2020 Fluoroscopy was utilized by the requesting physician.  No radiographic interpretation.   DG C-Arm 1-60 Min-No Report  Result Date: 11/14/2020 Fluoroscopy was utilized by the requesting physician.  No radiographic interpretation.   DG HIP OPERATIVE UNILAT WITH PELVIS LEFT  Result Date: 11/14/2020 CLINICAL DATA:  Left hip replacement EXAM: OPERATIVE LEFT HIP WITH PELVIS COMPARISON:  05/19/2020 FLUOROSCOPY TIME:  Radiation Exposure Index (as provided by the fluoroscopic device): 1.98 mGy If the device does not provide the exposure index: Fluoroscopy Time:  18 seconds Number of Acquired Images:  4 FINDINGS: Initial images demonstrate significant destruction of the left femoral head progressed in the interval from the prior exam. Subsequent images demonstrate removal of the femoral neck and femoral head with placement of left hip prosthesis. No acute bony or soft tissue abnormality is seen. Lucent lesion is again noted in the region of the right femoral neck and head. IMPRESSION: Left hip arthroplasty Electronically Signed   By: Inez Catalina M.D.   On: 11/14/2020 19:05    Dialysis Orders: MWF at [place], 400/500, EDW 88kg, 2K/2.5Ca, AVF, heparin 2000 bolus - Mircera 152mcg IV q 2 weeks (last 9/21 for Hgb 7.7)  Assessment/Plan:  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  ESRD:  Continue HD per MWF schedule - for HD tomorrow, no heparin x 1 week with HD.  Hypertension/volume: BP controlled, no edema on exam.  Anemia: Hgb down to 7 - due for ESA tomorrow, will also plan for 1U PRBCs - pt agreeable.  Metabolic  bone disease: Ca 8 - continue home binders  T2DM   Hx CVA  Veneta Penton, PA-C 11/15/2020, 11:35 AM  [facility]

## 2020-11-15 NOTE — Evaluation (Signed)
Physical Therapy Evaluation Patient Details Name: [name]. MRN: [id number] DOB: Sep 14, 1960 Today's Date: 11/15/2020  History of Present Illness  Patient is a 60 y/o male who presents s/p left THA 11/14/20 direct anterior approach. PMH includes TIA, ESRD, DM, HTN, Rt TSA, depression, anxiety, stroke, seizures.  Clinical Impression  Patient presents with pain, impaired sensation/coordination LLE and post surgical deficits s/p above. Pt reports being Mod I for ADLs and walking with rollator PTA but having difficulty with LB dressing and mobility. Today, pt requires Min A for bed mobility, transfers and gait training with use of RW for support. Gait distance limited due to impaired sensation LLE and knee instability. Instructed pt in there ex to perform throughout the day. Plans to d/c home with support of brother. Will follow acutely to maximize independence and mobility prior to return home.     Recommendations for follow up therapy are one component of a multi-disciplinary discharge planning process, led by the attending physician.  Recommendations may be updated based on patient status, additional functional criteria and insurance authorization.  Follow Up Recommendations Follow surgeon's recommendation for DC plan and follow-up therapies;Supervision for mobility/OOB    Equipment Recommendations  Rolling walker with 5" wheels    Recommendations for Other Services       Precautions / Restrictions Precautions Precautions: Other (comment);Fall Precaution Comments: wound vac Restrictions Weight Bearing Restrictions: Yes LLE Weight Bearing: Weight bearing as tolerated      Mobility  Bed Mobility Overal bed mobility: Needs Assistance Bed Mobility: Rolling;Sidelying to Sit Rolling: Min guard Sidelying to sit: Min assist;HOB elevated       General bed mobility comments: assist with LLE to get to EOB, heavy use of rail.    Transfers Overall transfer level: Needs  assistance Equipment used: Rolling walker (2 wheeled) Transfers: Sit to/from Stand Sit to Stand: From elevated surface;Min guard         General transfer comment: Min guard for safety from elevated bed height. Transferred to chair post ambulation.  Ambulation/Gait Ambulation/Gait assistance: Min assist Gait Distance (Feet): 5 Feet Assistive device: Rolling walker (2 wheeled) Gait Pattern/deviations: Step-to pattern;Decreased stance time - left;Decreased step length - right;Step-through pattern;Narrow base of support Gait velocity: decreased Gait velocity interpretation: <1.31 ft/sec, indicative of household ambulator General Gait Details: Slow, guarded and unsteady gait wtih difficulty controlling placement of LLE due to decreased sensation/weakness. Left knee instability.  Stairs            Wheelchair Mobility    Modified Rankin (Stroke Patients Only)       Balance Overall balance assessment: Needs assistance Sitting-balance support: Feet supported;No upper extremity supported Sitting balance-Leahy Scale: Good     Standing balance support: During functional activity Standing balance-Leahy Scale: Poor Standing balance comment: Requires UE support in standing.                             Pertinent Vitals/Pain Pain Assessment: 0-10 Pain Score: 8  Pain Location: left knee/hip Pain Descriptors / Indicators: Sore;Aching;Discomfort;Guarding Pain Intervention(s): Monitored during session;Premedicated before session;Repositioned;Limited activity within patient's tolerance    Home Living Family/patient expects to be discharged to:: Private residence Living Arrangements: Other relatives Available Help at Discharge: Family;Available PRN/intermittently Type of Home: House Home Access: Stairs to enter   Entrance Stairs-Number of Steps: 1 + 1 Home Layout: One level Home Equipment: Toilet riser;Walker - 4 wheels      Prior Function Level of Independence:  Independent  with assistive device(s)         Comments: Uses rollator for ambulation. Drives some. Son drives pt to HD. Was able to dress self/bath with increased time/difficulty. Does a little bit of IADLs. Was getting OPPT prior to surgery     Hand Dominance   Dominant Hand: Right    Extremity/Trunk Assessment   Upper Extremity Assessment Upper Extremity Assessment: Defer to OT evaluation    Lower Extremity Assessment Lower Extremity Assessment: LLE deficits/detail;Generalized weakness LLE Deficits / Details: limited due to post op pain LLE Sensation: decreased light touch;decreased proprioception LLE Coordination: decreased fine motor;decreased gross motor       Communication   Communication: No difficulties  Cognition Arousal/Alertness: Awake/alert Behavior During Therapy: WFL for tasks assessed/performed Overall Cognitive Status: Within Functional Limits for tasks assessed                                        General Comments      Exercises Total Joint Exercises Ankle Circles/Pumps: AROM;Both;10 reps;Supine Quad Sets: AROM;Both;10 reps;Supine Gluteal Sets: AROM;Both;10 reps;Seated   Assessment/Plan    PT Assessment Patient needs continued PT services  PT Problem List Decreased strength;Decreased range of motion;Decreased mobility;Decreased coordination;Pain;Decreased balance;Impaired sensation;Decreased activity tolerance;Decreased knowledge of use of DME       PT Treatment Interventions Gait training;Therapeutic exercise;Patient/family education;Therapeutic activities;Functional mobility training;Stair training;Balance training    PT Goals (Current goals can be found in the Care Plan section)  Acute Rehab PT Goals Patient Stated Goal: to be able to walk around and go home PT Goal Formulation: With patient Time For Goal Achievement: 11/29/20 Potential to Achieve Goals: Good    Frequency 7X/week   Barriers to discharge Decreased  caregiver support brother might be travelling when pt gets home initially    Co-evaluation               AM-PAC PT "6 Clicks" Mobility  Outcome Measure Help needed turning from your back to your side while in a flat bed without using bedrails?: A Little Help needed moving from lying on your back to sitting on the side of a flat bed without using bedrails?: A Little Help needed moving to and from a bed to a chair (including a wheelchair)?: A Little Help needed standing up from a chair using your arms (e.g., wheelchair or bedside chair)?: A Little Help needed to walk in hospital room?: A Little Help needed climbing 3-5 steps with a railing? : A Lot 6 Click Score: 17    End of Session Equipment Utilized During Treatment: Gait belt Activity Tolerance: Patient tolerated treatment well;Other (comment) (limited by decreased sensation LLE) Patient left: in chair;with call bell/phone within reach;with chair alarm set Nurse Communication: Mobility status;Weight bearing status PT Visit Diagnosis: Pain;Muscle weakness (generalized) (M62.81);Difficulty in walking, not elsewhere classified (R26.2) Pain - Right/Left: Left Pain - part of body: Knee;Hip    Time: 5374-8270 PT Time Calculation (min) (ACUTE ONLY): 41 min   Charges:   PT Evaluation $PT Eval Moderate Complexity: 1 Mod PT Treatments $Therapeutic Activity: 23-37 mins        Marisa Severin, PT, DPT Acute Rehabilitation Services Pager [phone] Office [place] 11/15/2020, 9:44 AM

## 2020-11-15 NOTE — Care Plan (Signed)
Ortho Bundle Case Management Note  Patient Details  Name: [name]. MRN: [id number] Date of Birth: May 15, 1960  RNCM spoke with patient at length regarding his upcoming Left total hip arthroplasty with Dr. Erlinda Hong. He is an Ortho bundle patient through [facility] and is agreeable to case management. He states he will be staying with his brother after surgery, but states his brother may have to go out of town for a few days after the surgery. He has a son that drives him to dialysis on MWF each week and other family members that can assist. Day of surgery states that he won't have help until Thursday of this week. He has recently had shoulder replacement in July and states he is doing well from this. Has a RW, 3in1, and cane at home already. Anticipate HHPT will be needed after hospital stay. Will continue to follow for needs.                    DME Arranged:   (Patient has all needed DME he reports for his left hip replacement) DME Agency:     HH Arranged:  PT HH Agency:  [place]  Additional Comments: Please contact me with any questions of if this plan should need to change.  Jamse Arn, RN, BSN, [employer]  [phone] 11/15/2020, 8:39 AM

## 2020-11-16 DIAGNOSIS — Z96611 Presence of right artificial shoulder joint: Secondary | ICD-10-CM | POA: Diagnosis present

## 2020-11-16 DIAGNOSIS — K3189 Other diseases of stomach and duodenum: Secondary | ICD-10-CM | POA: Diagnosis not present

## 2020-11-16 DIAGNOSIS — K2980 Duodenitis without bleeding: Secondary | ICD-10-CM | POA: Diagnosis not present

## 2020-11-16 DIAGNOSIS — D62 Acute posthemorrhagic anemia: Secondary | ICD-10-CM | POA: Diagnosis not present

## 2020-11-16 DIAGNOSIS — K2971 Gastritis, unspecified, with bleeding: Secondary | ICD-10-CM | POA: Diagnosis not present

## 2020-11-16 DIAGNOSIS — K648 Other hemorrhoids: Secondary | ICD-10-CM | POA: Diagnosis not present

## 2020-11-16 DIAGNOSIS — K259 Gastric ulcer, unspecified as acute or chronic, without hemorrhage or perforation: Secondary | ICD-10-CM | POA: Diagnosis not present

## 2020-11-16 DIAGNOSIS — D649 Anemia, unspecified: Secondary | ICD-10-CM | POA: Diagnosis not present

## 2020-11-16 DIAGNOSIS — K2981 Duodenitis with bleeding: Secondary | ICD-10-CM | POA: Diagnosis not present

## 2020-11-16 DIAGNOSIS — K250 Acute gastric ulcer with hemorrhage: Secondary | ICD-10-CM | POA: Diagnosis not present

## 2020-11-16 DIAGNOSIS — I12 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 stage renal disease: Secondary | ICD-10-CM | POA: Diagnosis not present

## 2020-11-16 DIAGNOSIS — E1122 Type 2 diabetes mellitus with diabetic chronic kidney disease: Secondary | ICD-10-CM | POA: Diagnosis not present

## 2020-11-16 DIAGNOSIS — K209 Esophagitis, unspecified without bleeding: Secondary | ICD-10-CM | POA: Diagnosis not present

## 2020-11-16 DIAGNOSIS — Z85528 Personal history of other malignant neoplasm of kidney: Secondary | ICD-10-CM | POA: Diagnosis not present

## 2020-11-16 DIAGNOSIS — E1129 Type 2 diabetes mellitus with other diabetic kidney complication: Secondary | ICD-10-CM | POA: Diagnosis not present

## 2020-11-16 DIAGNOSIS — K2289 Other specified disease of esophagus: Secondary | ICD-10-CM | POA: Diagnosis not present

## 2020-11-16 DIAGNOSIS — K254 Chronic or unspecified gastric ulcer with hemorrhage: Secondary | ICD-10-CM | POA: Diagnosis not present

## 2020-11-16 DIAGNOSIS — K2950 Unspecified chronic gastritis without bleeding: Secondary | ICD-10-CM | POA: Diagnosis not present

## 2020-11-16 DIAGNOSIS — M1612 Unilateral primary osteoarthritis, left hip: Secondary | ICD-10-CM | POA: Diagnosis not present

## 2020-11-16 DIAGNOSIS — K5731 Diverticulosis of large intestine without perforation or abscess with bleeding: Secondary | ICD-10-CM | POA: Diagnosis not present

## 2020-11-16 DIAGNOSIS — Z961 Presence of intraocular lens: Secondary | ICD-10-CM | POA: Diagnosis present

## 2020-11-16 DIAGNOSIS — K2100 Gastro-esophageal reflux disease with esophagitis, without bleeding: Secondary | ICD-10-CM | POA: Diagnosis present

## 2020-11-16 DIAGNOSIS — N2581 Secondary hyperparathyroidism of renal origin: Secondary | ICD-10-CM | POA: Diagnosis present

## 2020-11-16 DIAGNOSIS — G4733 Obstructive sleep apnea (adult) (pediatric): Secondary | ICD-10-CM | POA: Diagnosis not present

## 2020-11-16 DIAGNOSIS — N186 End stage renal disease: Secondary | ICD-10-CM | POA: Diagnosis not present

## 2020-11-16 DIAGNOSIS — M25552 Pain in left hip: Secondary | ICD-10-CM | POA: Diagnosis present

## 2020-11-16 DIAGNOSIS — K5730 Diverticulosis of large intestine without perforation or abscess without bleeding: Secondary | ICD-10-CM | POA: Diagnosis not present

## 2020-11-16 DIAGNOSIS — R195 Other fecal abnormalities: Secondary | ICD-10-CM | POA: Diagnosis not present

## 2020-11-16 DIAGNOSIS — N189 Chronic kidney disease, unspecified: Secondary | ICD-10-CM | POA: Diagnosis not present

## 2020-11-16 DIAGNOSIS — K2960 Other gastritis without bleeding: Secondary | ICD-10-CM | POA: Diagnosis not present

## 2020-11-16 DIAGNOSIS — K5710 Diverticulosis of small intestine without perforation or abscess without bleeding: Secondary | ICD-10-CM | POA: Diagnosis not present

## 2020-11-16 DIAGNOSIS — Z905 Acquired absence of kidney: Secondary | ICD-10-CM | POA: Diagnosis not present

## 2020-11-16 DIAGNOSIS — D509 Iron deficiency anemia, unspecified: Secondary | ICD-10-CM | POA: Diagnosis not present

## 2020-11-16 DIAGNOSIS — E785 Hyperlipidemia, unspecified: Secondary | ICD-10-CM | POA: Diagnosis present

## 2020-11-16 DIAGNOSIS — M25572 Pain in left ankle and joints of left foot: Secondary | ICD-10-CM | POA: Diagnosis present

## 2020-11-16 DIAGNOSIS — Z8673 Personal history of transient ischemic attack (TIA), and cerebral infarction without residual deficits: Secondary | ICD-10-CM | POA: Diagnosis not present

## 2020-11-16 DIAGNOSIS — Z9841 Cataract extraction status, right eye: Secondary | ICD-10-CM | POA: Diagnosis not present

## 2020-11-16 DIAGNOSIS — Z992 Dependence on renal dialysis: Secondary | ICD-10-CM | POA: Diagnosis not present

## 2020-11-16 DIAGNOSIS — E7800 Pure hypercholesterolemia, unspecified: Secondary | ICD-10-CM | POA: Diagnosis not present

## 2020-11-16 DIAGNOSIS — D631 Anemia in chronic kidney disease: Secondary | ICD-10-CM | POA: Diagnosis not present

## 2020-11-16 DIAGNOSIS — N250 Renal osteodystrophy: Secondary | ICD-10-CM | POA: Diagnosis not present

## 2020-11-16 DIAGNOSIS — K449 Diaphragmatic hernia without obstruction or gangrene: Secondary | ICD-10-CM | POA: Diagnosis not present

## 2020-11-16 DIAGNOSIS — I953 Hypotension of hemodialysis: Secondary | ICD-10-CM | POA: Diagnosis not present

## 2020-11-16 DIAGNOSIS — Z9842 Cataract extraction status, left eye: Secondary | ICD-10-CM | POA: Diagnosis not present

## 2020-11-16 DIAGNOSIS — Z96642 Presence of left artificial hip joint: Secondary | ICD-10-CM | POA: Diagnosis not present

## 2020-11-16 LAB — RETICULOCYTES
Immature Retic Fract: 19.6 % — ABNORMAL HIGH (ref 2.3–15.9)
RBC.: 2.63 MIL/uL — ABNORMAL LOW (ref 4.22–5.81)
Retic Count, Absolute: 27.9 10*3/uL (ref 19.0–186.0)
Retic Ct Pct: 1.1 % (ref 0.4–3.1)

## 2020-11-16 LAB — RENAL FUNCTION PANEL
Albumin: 2.2 g/dL — ABNORMAL LOW (ref 3.5–5.0)
Anion gap: 16 — ABNORMAL HIGH (ref 5–15)
BUN: 61 mg/dL — ABNORMAL HIGH (ref 8–23)
CO2: 22 mmol/L (ref 22–32)
Calcium: 7.7 mg/dL — ABNORMAL LOW (ref 8.9–10.3)
Chloride: 94 mmol/L — ABNORMAL LOW (ref 98–111)
Creatinine, Ser: 9.55 mg/dL — ABNORMAL HIGH (ref 0.61–1.24)
GFR, Estimated: 6 mL/min — ABNORMAL LOW (ref >60)
Glucose, Bld: 88 mg/dL (ref 70–99)
Phosphorus: 4.6 mg/dL (ref 2.5–4.6)
Potassium: 4.5 mmol/L (ref 3.5–5.1)
Sodium: 132 mmol/L — ABNORMAL LOW (ref 135–145)

## 2020-11-16 LAB — IRON AND TIBC
Iron: 23 ug/dL — ABNORMAL LOW (ref 45–182)
Saturation Ratios: 15 % — ABNORMAL LOW (ref 17.9–39.5)
TIBC: 157 ug/dL — ABNORMAL LOW (ref 250–450)
UIBC: 134 ug/dL

## 2020-11-16 LAB — CBC
HCT: 17.3 % — ABNORMAL LOW (ref 39.0–52.0)
HCT: 23.3 % — ABNORMAL LOW (ref 39.0–52.0)
Hemoglobin: 5.4 g/dL — CL (ref 13.0–17.0)
Hemoglobin: 7.4 g/dL — ABNORMAL LOW (ref 13.0–17.0)
MCH: 27.1 pg (ref 26.0–34.0)
MCH: 27.7 pg (ref 26.0–34.0)
MCHC: 31.2 g/dL (ref 30.0–36.0)
MCHC: 31.8 g/dL (ref 30.0–36.0)
MCV: 86.9 fL (ref 80.0–100.0)
MCV: 87.3 fL (ref 80.0–100.0)
Platelets: 217 10*3/uL (ref 150–400)
Platelets: 259 10*3/uL (ref 150–400)
RBC: 1.99 MIL/uL — ABNORMAL LOW (ref 4.22–5.81)
RBC: 2.67 MIL/uL — ABNORMAL LOW (ref 4.22–5.81)
RDW: 16.8 % — ABNORMAL HIGH (ref 11.5–15.5)
RDW: 15.9 % — ABNORMAL HIGH (ref 11.5–15.5)
WBC: 9.7 10*3/uL (ref 4.0–10.5)
WBC: 10.1 10*3/uL (ref 4.0–10.5)
nRBC: 0 % (ref 0.0–0.2)
nRBC: 0 % (ref 0.0–0.2)

## 2020-11-16 LAB — HEPATITIS B SURFACE ANTIBODY,QUALITATIVE: Hep B S Ab: REACTIVE — AB

## 2020-11-16 LAB — PREPARE RBC (CROSSMATCH)

## 2020-11-16 LAB — HEPATITIS B SURFACE ANTIGEN: Hepatitis B Surface Ag: NONREACTIVE

## 2020-11-16 LAB — FERRITIN: Ferritin: 1472 ng/mL — ABNORMAL HIGH (ref 24–336)

## 2020-11-16 MED ORDER — HEPARIN SODIUM (PORCINE) 1000 UNIT/ML DIALYSIS: 1000.0000 [IU] | INTRAMUSCULAR | Status: DC | PRN

## 2020-11-16 MED ORDER — LIDOCAINE HCL (PF) 1 % IJ SOLN: 5.0000 mL | INTRAMUSCULAR | Status: DC | PRN

## 2020-11-16 MED ORDER — SODIUM CHLORIDE 0.9% IV SOLUTION: Freq: Once | INTRAVENOUS | Status: DC

## 2020-11-16 MED ORDER — SODIUM CHLORIDE 0.9 % IV SOLN: 100.0000 mL | INTRAVENOUS | Status: DC | PRN

## 2020-11-16 MED ORDER — PENTAFLUOROPROP-TETRAFLUOROETH EX AERO: 1.0000 "application " | INHALATION_SPRAY | CUTANEOUS | Status: DC | PRN

## 2020-11-16 NOTE — Progress Notes (Addendum)
Subjective: 2 Days Post-Op Procedure(s) (LRB): LEFT TOTAL HIP ARTHROPLASTY ANTERIOR APPROACH (Left) Patient reports pain as mild.    Objective: Vital signs in last 24 hours: Temp:  [98 F (36.7 C)-98.8 F (37.1 C)] 98 F (36.7 C) (10/05 0722) Pulse Rate:  [80-85] 82 (10/05 0722) Resp:  [16-18] 17 (10/05 0722) BP: (114-120)/(70-76) 117/76 (10/05 0722) SpO2:  [100 %] 100 % (10/05 0722)  Intake/Output from previous day: 10/04 0701 - 10/05 0700 In: 48.1 [I.V.:48.1] Out: -  Intake/Output this shift: No intake/output data recorded.  Recent Labs    11/14/20 1431 11/15/20 0033  HGB 9.2* 7.0*   Recent Labs    11/14/20 1431 11/15/20 0033  WBC  --  12.4*  RBC  --  2.56*  HCT 27.0* 22.6*  PLT  --  260   Recent Labs    11/14/20 1431 11/15/20 0033  NA 137 135  K 3.8 4.8  CL 98 97*  CO2  --  25  BUN 29* 34*  CREATININE 7.50* 7.64*  GLUCOSE 70 103*  CALCIUM  --  8.0*   No results for input(s): LABPT, INR in the last 72 hours.  Neurologically intact Neurovascular intact Sensation intact distally Intact pulses distally Dorsiflexion/Plantar flexion intact Incision: dressing C/D/I No cellulitis present Compartment soft   Assessment/Plan: 2 Days Post-Op Procedure(s) (LRB): LEFT TOTAL HIP ARTHROPLASTY ANTERIOR APPROACH (Left) Advance diet Up with therapy Plan for discharge tomorrow home with hhpt WBAT LLE ABLA on chronic anemia- hemoglobin 7.0 yesterday.  Currently asymptomatic.  He does not wish for blood transfusion unless life threatening       Aundra Dubin 11/16/2020, 8:16 AM

## 2020-11-16 NOTE — Progress Notes (Signed)
PROGRESS NOTE  Carl Kerrigan Sr. NKN:[id number] DOB: 28-May-1960 DOA: 11/14/2020 PCP: [name], MD  HPI/Recap of past 24 hours:  60-year with history of ESRD on HD Monday Wednesday Friday, HTN, DM2, TIA, lacunar CVA, GERD, anemia of chronic disease, HLD, OSA, osteoarthritis of the hip admitted for hip replacement.  Medical team consulted for medication management. Care transferred to [facility] on 11/16/2020.  11/16/2020: Patient was seen and examined at his bedside.  Endorses pain in his right hip.  He uses ice pack to help lower the pain.  He is also on IV Dilaudid as needed for severe pain.  Drop in hemoglobin this morning, down to 5.4 from 7.0 and 9.2 on presentation.  He was transfused at hemodialysis for symptomatic anemia.  He denies have any abdominal pain.  No hematochezia or melena.  Will order FOBT and iron studies, to rule out GI source as a cause of his anemia.    Assessment/Plan: Principal Problem:   Primary osteoarthritis of left hip Active Problems:   GERD (gastroesophageal reflux disease)   History of lacunar cerebrovascular accident (CVA)   Anemia of renal disease   HLD (hyperlipidemia)   Essential hypertension   ESRD on hemodialysis ([place])   Obstructive sleep apnea   Type 2 diabetes mellitus with other specified complication (HCC)   Status post total replacement of left hip   Left hip pain  Primary osteoarthritis of left hip s/p total hip arthroplasty on 11/15/2020. -Status post left hip total arthroplasty.  Pain management and postop DVT prophylaxis per orthopedic Pain management PT OT assessment Fall precautions  Acute blood loss anemia in the setting of anemia of chronic disease Hemoglobin dropped to 5.4, transfused at hemodialysis Obtain FOBT and iron studies   Acute left ankle pain -Some soft tissue swelling.  No evidence of infection.  Uric acid levels are normal.  Continue to monitor, pain control.  Orthopedic follow-up Pain management    ESRD on  hemodialysis ([place]) -HD MWF.  Nephrology following. Completed HD on 25 2220     GERD (gastroesophageal reflux disease) -PPI     History of lacunar cerebrovascular accident (CVA) On aspirin 81 mg twice daily for DVT prophylaxis Continue home Lipitor PT OT assessment pending.     HLD (hyperlipidemia) - Continue home Lipitor     Essential hypertension Continue home midodrine MWF     Obstructive sleep apnea -Not on home CPAP.  Dry skin from ESRD Possible uremic pruritus As needed p.o. Benadryl     Code Status: Full code        Code Status: Full code  Family Communication: None at bedside  Disposition Plan: Likely will discharge to home with home health services.   Consultants: Nephrology Orthopedic surgery  Procedures: HD on 11/16/2020.  Antimicrobials: None.  DVT prophylaxis: Aspirin 81 mg twice daily.  Status is: Inpatient    Dispo: The patient is from: Home.              Anticipated d/c is to: Home with home health services.              Patient currently not medically stable for discharge.   Difficult to place patient not applicable.        Objective: Vitals:   11/16/20 1100 11/16/20 1130 11/16/20 1245 11/16/20 1522  BP: 128/72 128/71 112/77 111/73  Pulse: 77 74 94 100  Resp:  11 16 16   Temp:  98.1 F (36.7 C) 98.5 F (36.9 C) 98.4 F (36.9 C)  TempSrc:  Oral Oral Oral  SpO2:  100% 100% 100%  Weight:      Height:        Intake/Output Summary (Last 24 hours) at 11/16/2020 1613 Last data filed at 11/16/2020 1400 Gross per 24 hour  Intake 615 ml  Output 1500 ml  Net -885 ml   Filed Weights   11/14/20 1357 11/16/20 0839  Weight: 88.1 kg 88.2 kg    Exam:  General: 60 y.o. year-old male well developed well nourished in no acute distress.  Alert and oriented x3. Cardiovascular: Regular rate and rhythm with no rubs or gallops.  No thyromegaly or JVD noted.   Respiratory: Clear to auscultation with no wheezes or rales. Good  inspiratory effort. Abdomen: Soft nontender nondistended with normal bowel sounds x4 quadrants. Musculoskeletal: No lower extremity edema. 2/4 pulses in all 4 extremities. Skin: No ulcerative lesions noted or rashes, Psychiatry: Mood is appropriate for condition and setting   Data Reviewed: CBC: Recent Labs  Lab 11/10/20 0930 11/14/20 1431 11/15/20 0033 11/16/20 0850  WBC 5.7  --  12.4* 9.7  NEUTROABS 4.0  --  11.3*  --   HGB 8.5* 9.2* 7.0* 5.4*  HCT 28.4* 27.0* 22.6* 17.3*  MCV 90.2  --  88.3 86.9  PLT 327  --  260 222   Basic Metabolic Panel: Recent Labs  Lab 11/10/20 0930 11/14/20 1431 11/15/20 0033 11/16/20 0850  NA 138 137 135 132*  K 3.5 3.8 4.8 4.5  CL 98 98 97* 94*  CO2 26  --  25 22  GLUCOSE 149* 70 103* 88  BUN 28* 29* 34* 61*  CREATININE 7.22* 7.50* 7.64* 9.55*  CALCIUM 9.6  --  8.0* 7.7*  PHOS  --   --   --  4.6   GFR: Estimated Creatinine Clearance: 9.1 mL/min (A) (by C-G formula based on SCr of 9.55 mg/dL (H)). Liver Function Tests: Recent Labs  Lab 11/10/20 0930 11/16/20 0850  AST 17  --   ALT 11  --   ALKPHOS 70  --   BILITOT 0.5  --   PROT 7.8  --   ALBUMIN 2.7* 2.2*   No results for input(s): LIPASE, AMYLASE in the last 168 hours. No results for input(s): AMMONIA in the last 168 hours. Coagulation Profile: Recent Labs  Lab 11/10/20 0930  INR 1.2   Cardiac Enzymes: No results for input(s): CKTOTAL, CKMB, CKMBINDEX, TROPONINI in the last 168 hours. BNP (last 3 results) No results for input(s): PROBNP in the last 8760 hours. HbA1C: Recent Labs    11/15/20 0033  HGBA1C 4.8   CBG: No results for input(s): GLUCAP in the last 168 hours. Lipid Profile: No results for input(s): CHOL, HDL, LDLCALC, TRIG, CHOLHDL, LDLDIRECT in the last 72 hours. Thyroid Function Tests: No results for input(s): TSH, T4TOTAL, FREET4, T3FREE, THYROIDAB in the last 72 hours. Anemia Panel: No results for input(s): VITAMINB12, FOLATE, FERRITIN, TIBC,  IRON, RETICCTPCT in the last 72 hours. Urine analysis:    Component Value Date/Time   COLORURINE YELLOW 08/06/2006 0806   APPEARANCEUR CLEAR 08/06/2006 0806   LABSPEC 1.013 08/06/2006 0806   PHURINE 8.0 08/06/2006 0806   GLUCOSEU 100 (A) 08/06/2006 0806   HGBUR MODERATE (A) 08/06/2006 0806   BILIRUBINUR NEGATIVE 08/06/2006 0806   KETONESUR NEGATIVE 08/06/2006 0806   PROTEINUR > 300 08/06/2006 0806   UROBILINOGEN 0.2 08/06/2006 0806   NITRITE NEGATIVE 08/06/2006 0806   LEUKOCYTESUR SMALL 08/06/2006 0806   Sepsis Labs: @LABRCNTIP (procalcitonin:4,lacticidven:4)  )  Recent Results (from the past 240 hour(s))  Surgical pcr screen     Status: None   Collection Time: 11/10/20  8:59 AM   Specimen: Nasal Mucosa; Nasal Swab  Result Value Ref Range Status   MRSA, PCR NEGATIVE NEGATIVE Final   Staphylococcus aureus NEGATIVE NEGATIVE Final    Comment: (NOTE) The Xpert SA Assay (FDA approved for NASAL specimens in patients 34 years of age and older), is one component of a comprehensive surveillance program. It is not intended to diagnose infection nor to guide or monitor treatment. Performed at [facility], [place] [address] [place], [place] [address]   SARS CORONAVIRUS 2 (TAT 6-24 HRS) Nasopharyngeal Nasopharyngeal Swab     Status: None   Collection Time: 11/10/20  9:00 AM   Specimen: Nasopharyngeal Swab  Result Value Ref Range Status   SARS Coronavirus 2 NEGATIVE NEGATIVE Final    Comment: (NOTE) SARS-CoV-2 target nucleic acids are NOT DETECTED.  The SARS-CoV-2 RNA is generally detectable in upper and lower respiratory specimens during the acute phase of infection. Negative results do not preclude SARS-CoV-2 infection, do not rule out co-infections with other pathogens, and should not be used as the sole basis for treatment or other patient management decisions. Negative results must be combined with clinical observations, patient history, and epidemiological information.  The expected result is Negative.  Fact Sheet for Patients: SugarRoll.be  Fact Sheet for Healthcare Providers: https://www.woods-mathews.com/  This test is not yet approved or cleared by the [place] FDA and  has been authorized for detection and/or diagnosis of SARS-CoV-2 by FDA under an Emergency Use Authorization (EUA). This EUA will remain  in effect (meaning this test can be used) for the duration of the COVID-19 declaration under Se ction 564(b)(1) of the Act, 21 U.S.C. section 360bbb-3(b)(1), unless the authorization is terminated or revoked sooner.  Performed at [facility], [place] [address], [place], [place] [address]       Studies: No results found.  Scheduled Meds:  sodium chloride   Intravenous Once   sodium chloride   Intravenous Once   aspirin  81 mg Oral BID   atorvastatin  10 mg Oral QPM   Chlorhexidine Gluconate Cloth  6 each Topical Q0600   cinacalcet  120 mg Oral Q supper   darbepoetin (ARANESP) injection - DIALYSIS  200 mcg Intravenous Q Wed-HD   docusate sodium  100 mg Oral BID   midodrine  10 mg Oral Q M,W,F   oxyCODONE  10 mg Oral Q12H   pantoprazole  40 mg Oral Q2200   polyethylene glycol  17 g Oral Daily   sevelamer carbonate  1,600 mg Oral With snacks   sevelamer carbonate  4,000 mg Oral TID with meals   topiramate  25 mg Oral BID    Continuous Infusions:  sodium chloride Stopped (11/15/20 9678)   methocarbamol (ROBAXIN) IV       LOS: 0 days     Kayleen Memos, MD [facility] Pager [phone]  If 7PM-7AM, please contact night-coverage [email or web address] Password [email or web address] 11/16/2020, 4:13 PM

## 2020-11-16 NOTE — Progress Notes (Signed)
Pt receives out-pt HD at [facility] on MWF. Pt arrives at 5:10 for 5:30 chair time. Will assist as needed.  Maxmilian Trostel Renal Navigator [phone] 

## 2020-11-16 NOTE — Progress Notes (Signed)
Physical Therapy Treatment Patient Details Name: Carl Albarran Sr. MRN: [id number] DOB: 06/20/1960 Today's Date: 11/16/2020   History of Present Illness Patient is a 60 y/o male who presents s/p left THA 11/14/20 direct anterior approach. PMH includes TIA, ESRD, DM, HTN, Rt TSA, depression, anxiety, stroke, seizures.    PT Comments    Pt was seen for exercises after being too fatigued from his HD today to do any walking.  Pt was tired but reports he is trying to walk to BR as much as possible.  Has a brace he was wearing on L knee to support it before the surgery, but acknowledges it is not very effective.  Pt is appropriate for further rehab and will need to continue to work on strength and standing tolerance to recover his independence.  His final rehab determination will be taken care of by his orthopedic surgeon.  Follow for acute PT goals as outlined in his POC.   Recommendations for follow up therapy are one component of a multi-disciplinary discharge planning process, led by the attending physician.  Recommendations may be updated based on patient status, additional functional criteria and insurance authorization.  Follow Up Recommendations  Follow surgeon's recommendation for DC plan and follow-up therapies;Supervision for mobility/OOB     Equipment Recommendations  Rolling walker with 5" wheels    Recommendations for Other Services       Precautions / Restrictions Precautions Precautions: Fall Precaution Comments: wound vac Restrictions Weight Bearing Restrictions: Yes LLE Weight Bearing: Weight bearing as tolerated     Mobility  Bed Mobility Overal bed mobility: Needs Assistance Bed Mobility: Rolling Rolling: Min guard         General bed mobility comments: pt assisting LLE to move    Transfers                 General transfer comment: declines  Ambulation/Gait                 Stairs             Wheelchair Mobility    Modified  Rankin (Stroke Patients Only)       Balance                                            Cognition Arousal/Alertness: Awake/alert Behavior During Therapy: WFL for tasks assessed/performed Overall Cognitive Status: Within Functional Limits for tasks assessed                                        Exercises Total Joint Exercises Ankle Circles/Pumps: AAROM;5 reps Quad Sets: AROM;10 reps Gluteal Sets: AROM;10 reps General Exercises - Lower Extremity Heel Slides: AAROM;10 reps Hip ABduction/ADduction: AAROM;10 reps    General Comments General comments (skin integrity, edema, etc.): pt declined OOB and instead agreed to exercise LE's      Pertinent Vitals/Pain Pain Assessment: Faces Faces Pain Scale: Hurts even more Pain Location: left knee/hip Pain Descriptors / Indicators: Guarding;Grimacing Pain Intervention(s): Limited activity within patient's tolerance;Monitored during session;Premedicated before session;Repositioned    Home Living                      Prior Function            PT Goals (current goals can  now be found in the care plan section) Acute Rehab PT Goals Patient Stated Goal: to get stronger and go home Progress towards PT goals: Not progressing toward goals - comment    Frequency    7X/week      PT Plan Current plan remains appropriate    Co-evaluation              AM-PAC PT "6 Clicks" Mobility   Outcome Measure  Help needed turning from your back to your side while in a flat bed without using bedrails?: A Little Help needed moving from lying on your back to sitting on the side of a flat bed without using bedrails?: A Little Help needed moving to and from a bed to a chair (including a wheelchair)?: A Little Help needed standing up from a chair using your arms (e.g., wheelchair or bedside chair)?: A Little Help needed to walk in hospital room?: A Little Help needed climbing 3-5 steps with a  railing? : A Lot 6 Click Score: 17    End of Session   Activity Tolerance: Patient tolerated treatment well Patient left: in bed;with call bell/phone within reach;with bed alarm set;with nursing/sitter in room Nurse Communication: Mobility status PT Visit Diagnosis: Pain;Muscle weakness (generalized) (M62.81);Difficulty in walking, not elsewhere classified (R26.2) Pain - Right/Left: Left Pain - part of body: Knee;Hip     Time: 1550-1611 PT Time Calculation (min) (ACUTE ONLY): 21 min  Charges:  $Therapeutic Exercise: 8-22 mins        Ramond Dial 11/16/2020, 10:14 PM  Mee Hives, PT PhD Acute Rehab Dept. Number: [place] [facility] [place]

## 2020-11-16 NOTE — Plan of Care (Signed)

## 2020-11-16 NOTE — Progress Notes (Addendum)
[facility] Progress Note   Subjective:   Seen on HD - no CP/dyspnea, no dizziness. Wants to hold off on blood since asymptomatic, will cancel. Today's labs pending.  Objective Vitals:   11/14/20 2054 11/15/20 1358 11/15/20 2145 11/16/20 0722  BP:  120/70 114/73 117/76  Pulse: 98 85 80 82  Resp:  18 16 17   Temp:  98.5 F (36.9 C) 98.8 F (37.1 C) 98 F (36.7 C)  TempSrc:  Oral Oral Oral  SpO2:  100% 100% 100%  Weight:      Height:       Physical Exam General: Well appearing man, NAD. Room air. Heart: RRR; no murmur Lungs: CTA anteriorly Abdomen: soft, non-tender Extremities: No LE edema; wound vac to anterior L hip - no drainage Dialysis Access: R forearm AVF + bruit  Additional Objective Labs: Basic Metabolic Panel: Recent Labs  Lab 11/10/20 0930 11/14/20 1431 11/15/20 0033  NA 138 137 135  K 3.5 3.8 4.8  CL 98 98 97*  CO2 26  --  25  GLUCOSE 149* 70 103*  BUN 28* 29* 34*  CREATININE 7.22* 7.50* 7.64*  CALCIUM 9.6  --  8.0*   Liver Function Tests: Recent Labs  Lab 11/10/20 0930  AST 17  ALT 11  ALKPHOS 70  BILITOT 0.5  PROT 7.8  ALBUMIN 2.7*   CBC: Recent Labs  Lab 11/10/20 0930 11/14/20 1431 11/15/20 0033  WBC 5.7  --  12.4*  NEUTROABS 4.0  --  11.3*  HGB 8.5* 9.2* 7.0*  HCT 28.4* 27.0* 22.6*  MCV 90.2  --  88.3  PLT 327  --  260   Studies/Results: DG Pelvis Portable  Result Date: 11/14/2020 CLINICAL DATA:  Status post left arthroplasty EXAM: PORTABLE PELVIS 1 VIEWS COMPARISON:  Films from earlier in the same day. FINDINGS: Pelvic ring is intact. Left hip replacement is now seen in satisfactory position. No acute bony or soft tissue abnormality is noted. Vascular calcifications are seen. IMPRESSION: Status post left hip replacement Electronically Signed   By: Inez Catalina M.D.   On: 11/14/2020 19:04   DG Ankle Left Port  Result Date: 11/14/2020 CLINICAL DATA:  Ankle pain EXAM: PORTABLE LEFT ANKLE - 2 VIEW COMPARISON:   None. FINDINGS: Soft tissue swelling is present. Age indeterminate avulsions at the tips of the medial and lateral malleoli. Ankle mortise is symmetric. Vascular calcifications. IMPRESSION: Soft tissue swelling. Age indeterminate avulsions off the tips of the medial and lateral malleoli Electronically Signed   By: Donavan Foil M.D.   On: 11/14/2020 19:42   DG C-Arm 1-60 Min-No Report  Result Date: 11/14/2020 Fluoroscopy was utilized by the requesting physician.  No radiographic interpretation.   DG C-Arm 1-60 Min-No Report  Result Date: 11/14/2020 Fluoroscopy was utilized by the requesting physician.  No radiographic interpretation.   DG HIP OPERATIVE UNILAT WITH PELVIS LEFT  Result Date: 11/14/2020 CLINICAL DATA:  Left hip replacement EXAM: OPERATIVE LEFT HIP WITH PELVIS COMPARISON:  05/19/2020 FLUOROSCOPY TIME:  Radiation Exposure Index (as provided by the fluoroscopic device): 1.98 mGy If the device does not provide the exposure index: Fluoroscopy Time:  18 seconds Number of Acquired Images:  4 FINDINGS: Initial images demonstrate significant destruction of the left femoral head progressed in the interval from the prior exam. Subsequent images demonstrate removal of the femoral neck and femoral head with placement of left hip prosthesis. No acute bony or soft tissue abnormality is seen. Lucent lesion is again noted in the region  of the right femoral neck and head. IMPRESSION: Left hip arthroplasty Electronically Signed   By: Inez Catalina M.D.   On: 11/14/2020 19:05    Medications:  sodium chloride Stopped (11/15/20 0712)   sodium chloride     sodium chloride     methocarbamol (ROBAXIN) IV     tranexamic acid      sodium chloride   Intravenous Once   aspirin  81 mg Oral BID   atorvastatin  10 mg Oral QPM   Chlorhexidine Gluconate Cloth  6 each Topical Q0600   cinacalcet  120 mg Oral Q supper   darbepoetin (ARANESP) injection - DIALYSIS  200 mcg Intravenous Q Wed-HD   docusate sodium   100 mg Oral BID   midodrine  10 mg Oral Q M,W,F   oxyCODONE  10 mg Oral Q12H   pantoprazole  40 mg Oral Q2200   polyethylene glycol  17 g Oral Daily   sevelamer carbonate  1,600 mg Oral With snacks   sevelamer carbonate  4,000 mg Oral TID with meals   topiramate  25 mg Oral BID    Dialysis Orders: MWF at [facility] 4[facility], 400/500, EDW 88kg, 2K/2.5Ca, AVF, heparin 2000 bolus - Mircera 134mcg IV q 2 weeks (last 9/21 for Hgb 7.7)   Assessment/Plan:  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  ESRD:  Continue HD per MWF schedule - HD today.  Hypertension/volume: BP controlled, no edema on exam.  Anemia: Hgb down to 7 - will give Aranesp today. Wants to hold off on blood transfusion unless life-threatening, cancelled transfusions order.  Metabolic bone disease: Ca 8 - continue home binders  T2DM   Hx CVA  Veneta Penton, PA-C 11/16/2020, 8:38 AM  [facility]  ADDENDUM (9:42a): Notifed by RN that Hgb 5.4 - offered transfusion again and he is now agreeable -> will transfuse 1U PRBCs today. [place]

## 2020-11-17 DIAGNOSIS — D62 Acute posthemorrhagic anemia: Secondary | ICD-10-CM | POA: Diagnosis not present

## 2020-11-17 DIAGNOSIS — R195 Other fecal abnormalities: Secondary | ICD-10-CM

## 2020-11-17 DIAGNOSIS — M1612 Unilateral primary osteoarthritis, left hip: Secondary | ICD-10-CM | POA: Diagnosis not present

## 2020-11-17 LAB — CBC
HCT: 22.8 % — ABNORMAL LOW (ref 39.0–52.0)
Hemoglobin: 7.3 g/dL — ABNORMAL LOW (ref 13.0–17.0)
MCH: 28.3 pg (ref 26.0–34.0)
MCHC: 32 g/dL (ref 30.0–36.0)
MCV: 88.4 fL (ref 80.0–100.0)
Platelets: 318 10*3/uL (ref 150–400)
RBC: 2.58 MIL/uL — ABNORMAL LOW (ref 4.22–5.81)
RDW: 16.7 % — ABNORMAL HIGH (ref 11.5–15.5)
WBC: 9.8 10*3/uL (ref 4.0–10.5)
nRBC: 0.2 % (ref 0.0–0.2)

## 2020-11-17 LAB — PREPARE RBC (CROSSMATCH)

## 2020-11-17 LAB — HEPATITIS B SURFACE ANTIBODY, QUANTITATIVE: Hep B S AB Quant (Post): 227.8 m[IU]/mL (ref >9.9)

## 2020-11-17 LAB — OCCULT BLOOD X 1 CARD TO LAB, STOOL: Fecal Occult Bld: POSITIVE — AB

## 2020-11-17 MED ORDER — SODIUM CHLORIDE 0.9% IV SOLUTION: Freq: Once | INTRAVENOUS | Status: DC

## 2020-11-17 MED ORDER — PANTOPRAZOLE SODIUM 40 MG IV SOLR
40.0000 mg | Freq: Two times a day (BID) | INTRAVENOUS | Status: DC
  Administered 2020-11-17 (×2): 40 mg via INTRAVENOUS
  Filled 2020-11-17 (×2): qty 40

## 2020-11-17 MED ORDER — CAMPHOR-MENTHOL 0.5-0.5 % EX LOTN
TOPICAL_LOTION | Freq: Two times a day (BID) | CUTANEOUS | Status: DC
  Administered 2020-11-20: 1 via TOPICAL
  Filled 2020-11-17 (×2): qty 222

## 2020-11-17 NOTE — Consult Note (Addendum)
Referring Provider:  [facility]         Primary Care Physician:  [name], MD Primary Gastroenterologist: [name], MD    Reason for Consultation:  Anemia                 ASSESSMENT / PLAN   # 60 yo male with development of acute on chronic anemia, H+ stool. He is s/p left hip replacement a few days ago. Left hip looks okay without hematomas. He has a history of PUD and reports darker than normal stool this admission.  His getting ASA BID and prior to admission was taking Ibuprofen for hip pain. Rule out recurrent PUD.  --Will schedule for EGD to be done tomorrow afternoon ( patient says he has dialysis in am).  --Clears today, NPO after 6am.  --Increasing Protonix to BID --Hgb improved post 1 u PRBC from 5.4 >> 7.4.  Baseline hgb 10-11. No labs done today so will check CBC now and again in am.   # History of partial colectomy and small bowel resection for large cecal mass (non-cancerous) in 2017.   # ESRD on HD. Dialysis in am   HISTORY OF PRESENT ILLNESS                                                                                                                         Chief Complaint:  anemia  Carl Vanaman Sr. is a 60 y.o. male with a past medical history significant for ESRD on HD, HTN, DM2 , HLD, OSA, CVA, diverticulosis, partial colectomy for inflammatory mass, GERD, PUD, anemia of chronic diseae. . See PMH for any additional medical history.   Patient admitted 11/14/2020 for surgery of the left hip for degenerative disease. On 10/3 he had a total hip replacement. Post-op his hgb declined from 9.2 >> 7 >> 5.4. Given Aransesp ( didn't wantblood products due to being a Jehovah's Witness). However he did  get a uPRBC with improvement in hemoglobin to 7.4 yesterday morning. MCV 87. He is FOBT+. On 81 mg asa BID, getting protonix qday  [name] feels okay. He has no nausea, vomiting or abdominal pain. He has had a couple of darker than usual bowel movements  this admission but thought it was related to meatloaf and gravy. Prior to admission he was taking Ibuprofen a couple of times a week for hip pain and thinks it may have flared up his stomach ulcer.   PREVIOUS ENDOSCOPIC EVALUATIONS   June 2017 colonoscopy for evaluation of hematochezia and abnormal CT scan -- Complete exam, good bowel prep.  Ulcerated, partially obstructing large mass in the proximal ascending colon very near the ICV.  Multiple diverticula in the ascending and sigmoid colon.  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  Biopsies done to rule out H. pylori.  Moderate patchy gastritis in the stomach.  Chronic duodenitis  June 2021 EGD for ulcer follow-up and recurrent decline in hgb -- Localized polypoid mucosa 5 to 7 mm in size with superficial erythema found in the gastric body.  No stigmata for bleeding.  Biopsies not taken given downtrending hemoglobin.  1 clean-based gastric ulcer with no stigmata of bleeding found at the pyloric channel, difficult to visualize.  Lesion was 4 to 5 mm in size.  No heme in the stomach or small bowel.  No endoscopic therapy was required and would be technically challenging anyway.  Patchy erythematous mucosa with hypertrophied folds was found in the duodenal bulb and in the second portion of the duodenum prolapsing in and out of the bulb.  No ulcerations or stigmata for bleeding noted   Past Medical History:  Diagnosis Date   Acute gastric ulcer with hemorrhage    Acute GI bleeding 07/31/2019   Acute pericarditis    Anemia    hx of   Anxiety    situational    Arthritis    on meds   Borderline diabetes    Cataract    bilateral sx   Chronic headaches    Depression    situational     Diverticulitis    ESRD (end stage renal disease) ([place])     On Renal Transplant List," [facility]; MWF" (10/23/2016)   GERD (gastroesophageal reflux disease)    with certain foods   GI bleed    Hypertension    diet controlled   Parathyroid abnormality ([place])    ectopic parathyroid gland   Presence of arteriovenous fistula for hemodialysis, primary ([place])    RUE   Refusal of blood product    NO WHOLE BLOOD PROUCTS   Renal cell carcinoma ([place])    s/p hand assisted laparoscopic bilateral nephrectomies 11/29/17, + RCC left   Secondary hyperparathyroidism ([place])    Seizures ([place])    one episode in past, due to" elevated Potassium" 08/02/20- "at least 4 years ago"   Sleep apnea    doesn't use CPAP anymore since weight loss   Stroke [facility])    no residual    Past Surgical History:  Procedure Laterality Date   AV FISTULA PLACEMENT Right    right arm   BIOPSY  08/01/2019   Procedure: BIOPSY;  Surgeon: Juanita Craver, MD;  Location: [facility] ENDOSCOPY;  Service: Endoscopy;;   CATARACT EXTRACTION W/ INTRAOCULAR LENS  IMPLANT, BILATERAL     COLON SURGERY     COLONOSCOPY N/A 08/04/2015   Procedure: COLONOSCOPY;  Surgeon: Jerene Bears, MD;  Location: [facility] ENDOSCOPY;  Service: Endoscopy;  Laterality: N/A;   COLONOSCOPY  2017   JMP@ [facility]-good prep-mass -recall 1 yr   ESOPHAGOGASTRODUODENOSCOPY N/A 08/01/2019   Procedure: ESOPHAGOGASTRODUODENOSCOPY (EGD);  Surgeon: Juanita Craver, MD;  Location: [facility] ENDOSCOPY;  Service: Endoscopy;  Laterality: N/A;   ESOPHAGOGASTRODUODENOSCOPY (EGD) WITH PROPOFOL N/A 08/04/2019   Procedure: ESOPHAGOGASTRODUODENOSCOPY (EGD) WITH PROPOFOL;  Surgeon: Yetta Flock, MD;  Location: [place];  Service: Gastroenterology;  Laterality: N/A;   graft left arm Left    for dialysis x 2. Removed   INSERTION OF DIALYSIS CATHETER     Rt chest   LAPAROSCOPIC RIGHT COLECTOMY N/A 08/05/2015   Procedure: LAPAROSCOPIC RIGHT COLECTOMY- ASCENDING;  Surgeon: Stark Klein, MD;  Location:  [place];  Service: General;  Laterality: N/A;   MASS EXCISION Left  05/28/2019   Procedure: EXCISION SOFT TISSUE MASS LEFT SHOULDER;  Surgeon: Armandina Gemma, MD;  Location: [facility];  Service: General;  Laterality: Left;   NEPHRECTOMY Bilateral    PARATHYROIDECTOMY N/A 06/12/2016   Procedure: TOTAL PARATHYROIDECTOMY WITH AUTOTRANSPLANTATION TO LEFT FOREARM;  Surgeon: Armandina Gemma, MD;  Location: [place];  Service: General;  Laterality: N/A;   PARATHYROIDECTOMY N/A 10/23/2016   Procedure: PARATHYROIDECTOMY;  Surgeon: Armandina Gemma, MD;  Location: [place];  Service: General;  Laterality: N/A;   REVERSE SHOULDER ARTHROPLASTY Right 08/24/2020   Procedure: REVERSE SHOULDER ARTHROPLASTY;  Surgeon: Hiram Gash, MD;  Location: [place];  Service: Orthopedics;  Laterality: Right;   REVISON OF ARTERIOVENOUS FISTULA Right 07/16/2017   Procedure: REVISION OF ARTERIOVENOUS FISTULA  Right ARM;  Surgeon: Waynetta Sandy, MD;  Location: [place];  Service: Vascular;  Laterality: Right;   TOTAL HIP ARTHROPLASTY Left 11/14/2020   Procedure: LEFT TOTAL HIP ARTHROPLASTY ANTERIOR APPROACH;  Surgeon: Leandrew Koyanagi, MD;  Location: [place];  Service: Orthopedics;  Laterality: Left;   UPPER GASTROINTESTINAL ENDOSCOPY  2021   @ [facility]    Prior to Admission medications   Medication Sig Start Date End Date Taking? Authorizing Provider  aspirin EC 81 MG tablet Take 1 tablet (81 mg total) by mouth 2 (two) times daily. To be taken after surgery Patient taking differently: Take 81 mg by mouth every other day. 11/08/20  Yes Aundra Dubin, PA-C  atorvastatin (LIPITOR) 10 MG tablet Take 10 mg by mouth every evening.   Yes [provider]  B Complex-C-Folic Acid (DIALYVITE PO) Take 1 tablet by mouth daily.   Yes [provider]  cinacalcet (SENSIPAR) 60 MG tablet Take 120 mg by mouth daily with supper.   Yes [provider]  cromolyn (OPTICROM) 4 % ophthalmic solution Place 1 drop into both eyes daily as  needed (redness).   Yes [provider]  ibuprofen (ADVIL) 800 MG tablet Take 800 mg by mouth 3 (three) times daily as needed for moderate pain.   Yes [provider]  LOKELMA 10 g PACK packet Take 1 packet by mouth daily. 03/11/19  Yes [provider]  midodrine (PROAMATINE) 10 MG tablet Take 10 mg by mouth See admin instructions. Take 10 mg by mouth three times a week as directed, bring to dialysis. May need an additional tablet during treatment.   Yes [provider]  omeprazole (PRILOSEC) 20 MG capsule Take 40 mg by mouth in the morning and at bedtime.   Yes [provider]  sevelamer carbonate (RENVELA) 800 MG tablet Take 1,600-4,000 mg by mouth See admin instructions. Take 4000 mg by mouth three times daily with meals and 1600-2400 mg with snacks 10/11/20  Yes [provider]  topiramate (TOPAMAX) 25 MG tablet Take 1 tablet (25 mg total) by mouth 2 (two) times daily. 03/08/20  Yes McCue, Janett Billow, NP  Turmeric (QC TUMERIC COMPLEX PO) Take 1 capsule by mouth daily. 08/06/19  Yes [provider]  vitamin E 180 MG (400 UNITS) capsule Take 400 Units by mouth daily.   Yes [provider]  cephALEXin (KEFLEX) 500 MG capsule Take 1 capsule (500 mg total) by mouth 4 (four) times daily. To be taken after surgery 11/08/20   Aundra Dubin, PA-C  docusate sodium (COLACE) 100 MG capsule Take 1 capsule (100 mg total) by mouth daily as needed. Patient taking differently: Take 100 mg by mouth in the morning and at bedtime. 11/08/20 11/08/21  Aundra Dubin, PA-C  gabapentin (NEURONTIN) 100 MG capsule Take 1 capsule (100 mg total) by mouth 3 (three) times daily for 14 days. For pain Patient not taking: Reported on 11/09/2020 08/25/20 09/08/20  Ethelda Chick, PA-C  methocarbamol (ROBAXIN) 500 MG tablet Take 1 tablet (500 mg total) by mouth 2 (two) times daily as needed. To be taken after surgery 11/08/20   Aundra Dubin, PA-C  ondansetron  (ZOFRAN) 4 MG tablet Take 1 tablet (4 mg total) by mouth every 8 (eight) hours as needed for nausea or vomiting. 11/08/20   Aundra Dubin, PA-C  oxyCODONE-acetaminophen (PERCOCET) 5-325 MG tablet Take 1-2 tablets by mouth every 6 (six) hours as needed. To be taken after surgery 11/08/20   Aundra Dubin, PA-C    Current Facility-Administered Medications  Medication Dose Route Frequency Provider Last Rate Last Admin   0.9 %  sodium chloride infusion   Intravenous Continuous Leandrew Koyanagi, MD   Stopped at 11/15/20 9983   acetaminophen (TYLENOL) tablet 325-650 mg  325-650 mg Oral Q6H PRN Leandrew Koyanagi, MD       alum & mag hydroxide-simeth (MAALOX/MYLANTA) 200-200-20 MG/5ML suspension 30 mL  30 mL Oral Q4H PRN Leandrew Koyanagi, MD       aspirin chewable tablet 81 mg  81 mg Oral BID Leandrew Koyanagi, MD   81 mg at 11/17/20 0800   atorvastatin (LIPITOR) tablet 10 mg  10 mg Oral QPM Amin, Ankit Chirag, MD   10 mg at 11/16/20 1723   camphor-menthol (SARNA) lotion   Topical BID Irene Pap N, DO       Chlorhexidine Gluconate Cloth 2 % PADS 6 each  6 each Topical Q0600 Loren Racer, PA-C   6 each at 11/16/20 3825   cinacalcet (SENSIPAR) tablet 120 mg  120 mg Oral Q supper Damita Lack, MD   120 mg at 11/16/20 1722   Darbepoetin Alfa (ARANESP) injection 200 mcg  200 mcg Intravenous Q Wed-HD Loren Racer, PA-C   200 mcg at 11/16/20 1213   diphenhydrAMINE (BENADRYL) 12.5 MG/5ML elixir 25 mg  25 mg Oral Q4H PRN Leandrew Koyanagi, MD       docusate sodium (COLACE) capsule 100 mg  100 mg Oral BID Leandrew Koyanagi, MD   100 mg at 11/17/20 0800   docusate sodium (COLACE) capsule 100 mg  100 mg Oral Daily PRN Amin, Ankit Chirag, MD       HYDROmorphone (DILAUDID) injection 0.5-1 mg  0.5-1 mg Intravenous Q4H PRN Leandrew Koyanagi, MD   1 mg at 11/17/20 1057   menthol-cetylpyridinium (CEPACOL) lozenge 3 mg  1 lozenge Oral PRN Leandrew Koyanagi, MD       Or   phenol (CHLORASEPTIC) mouth spray 1 spray  1 spray  Mouth/Throat PRN Leandrew Koyanagi, MD       methocarbamol (ROBAXIN) tablet 500 mg  500 mg Oral Q6H PRN Leandrew Koyanagi, MD       Or   methocarbamol (ROBAXIN) 500 mg in dextrose 5 % 50 mL IVPB  500 mg Intravenous Q6H PRN Leandrew Koyanagi, MD       metoCLOPramide (REGLAN) tablet 5-10 mg  5-10 mg Oral Q8H PRN Leandrew Koyanagi, MD       Or   metoCLOPramide (REGLAN) injection 5-10 mg  5-10 mg Intravenous Q8H PRN Leandrew Koyanagi, MD       midodrine (PROAMATINE) tablet 10 mg  10 mg  Oral Q M,W,F Amin, Ankit Chirag, MD       ondansetron (ZOFRAN) tablet 4 mg  4 mg Oral Q6H PRN Leandrew Koyanagi, MD       Or   ondansetron [facility]) injection 4 mg  4 mg Intravenous Q6H PRN Leandrew Koyanagi, MD       oxyCODONE (Oxy IR/ROXICODONE) immediate release tablet 10-15 mg  10-15 mg Oral Q4H PRN Leandrew Koyanagi, MD   15 mg at 11/17/20 7628   oxyCODONE (Oxy IR/ROXICODONE) immediate release tablet 5-10 mg  5-10 mg Oral Q4H PRN Leandrew Koyanagi, MD   10 mg at 11/17/20 0110   oxyCODONE (OXYCONTIN) 12 hr tablet 10 mg  10 mg Oral Q12H Leandrew Koyanagi, MD   10 mg at 11/17/20 0817   pantoprazole (PROTONIX) EC tablet 40 mg  40 mg Oral Q2200 Leandrew Koyanagi, MD   40 mg at 11/16/20 2153   polyethylene glycol (MIRALAX / GLYCOLAX) packet 17 g  17 g Oral Daily Leandrew Koyanagi, MD   17 g at 11/17/20 0801   sevelamer carbonate (RENVELA) tablet 1,600 mg  1,600 mg Oral With snacks Joselyn Glassman A, RPH   1,600 mg at 11/16/20 2158   sevelamer carbonate (RENVELA) tablet 4,000 mg  4,000 mg Oral TID with meals Amin, Ankit Chirag, MD   4,000 mg at 11/17/20 0758   sorbitol 70 % solution 30 mL  30 mL Oral Daily PRN Leandrew Koyanagi, MD       topiramate (TOPAMAX) tablet 25 mg  25 mg Oral BID Leandrew Koyanagi, MD   25 mg at 11/17/20 0800    Allergies as of 10/28/2020 - Review Complete 10/20/2020  Allergen Reaction Noted   Infed [iron dextran] Other (See Comments) 06/19/2010   Oxycodone Nausea Only 06/19/2010   Vicodin [hydrocodone-acetaminophen] Other (See Comments) 06/19/2010    Whole blood Other (See Comments) 07/01/2014   Hydrocodone-acetaminophen  10/09/2019   Diclofenac Other (See Comments) 07/01/2014    Family History  Problem Relation Age of Onset   Diabetes Father    Stroke Father    Hypertension Father    Uterine cancer Mother    Lupus Sister    Stroke Sister    Hypertension Sister    Anuerysm Brother        brain   Colon cancer Neg Hx    Esophageal cancer Neg Hx    Stomach cancer Neg Hx    Pancreatic cancer Neg Hx    Liver disease Neg Hx    Colon polyps Neg Hx    Rectal cancer Neg Hx     Social History   Socioeconomic History   Marital status: Single    Spouse name: Not on file   Number of children: 2   Years of education: Not on file   Highest education level: Not on file  Occupational History   Occupation: retired    Fish farm manager: SELF EMPLOYED  Tobacco Use   Smoking status: Former    Types: Cigarettes    Quit date: 06/19/1990    Years since quitting: 30.4   Smokeless tobacco: Never   Tobacco comments:    rarely cigar  Vaping Use   Vaping Use: Never used  Substance and Sexual Activity   Alcohol use: Yes    Alcohol/week: 1.0 standard drink    Types: 1 Cans of beer per week    Comment: occasional beer   Drug use: Not Currently    Types: Marijuana  Comment: Last use several yrs ago   Sexual activity: Not Currently  Other Topics Concern   Not on file  Social History Narrative   Lives alone at home   12 years of education    2 children    Not married    Social Determinants of Health   Financial Resource Strain: Not on file  Food Insecurity: Not on file  Transportation Needs: Not on file  Physical Activity: Not on file  Stress: Not on file  Social Connections: Not on file  Intimate Partner Violence: Not on file    Review of Systems: All systems reviewed and negative except where noted in HPI.     OBJECTIVE    Physical Exam: Vital signs in last 24 hours: Temp:  [98.2 F (36.8 C)-98.6 F (37 C)] 98.2 F  (36.8 C) (10/06 0900) Pulse Rate:  [64-100] 64 (10/06 0900) Resp:  [16-17] 17 (10/05 2106) BP: (111-132)/(68-77) 132/68 (10/06 0900) SpO2:  [98 %-100 %] 98 % (10/06 0900) Weight:  [86.7 kg] 86.7 kg (10/05 1200) Last BM Date: 11/16/20 General:   Alert  male in NAD in bedside chair Psych:  Pleasant, cooperative. Normal mood and affect. Eyes:  Pupils equal, sclera clear, no icterus.   Conjunctiva pink. Ears:  Normal auditory acuity. Nose:  No deformity, discharge,  or lesions. Neck:  Supple; no masses Lungs:  Clear throughout to auscultation.   No wheezes, crackles, or rhonchi.  Heart:  Regular rate and rhythm;  no lower extremity edema Abdomen:  Soft, non-distended, nontender, BS active, no palp mass ( limited exam in chair)   Rectal:  Deferred  Msk:  Symmetrical without gross deformities. Left hip wound vac in place. No bruising / swelling at surgical site Neurologic:  Alert and  oriented x4;  grossly normal neurologically. Skin:  Intact without significant lesions or rashes.   Scheduled inpatient medications  aspirin  81 mg Oral BID   atorvastatin  10 mg Oral QPM   camphor-menthol   Topical BID   Chlorhexidine Gluconate Cloth  6 each Topical Q0600   cinacalcet  120 mg Oral Q supper   darbepoetin (ARANESP) injection - DIALYSIS  200 mcg Intravenous Q Wed-HD   docusate sodium  100 mg Oral BID   midodrine  10 mg Oral Q M,W,F   oxyCODONE  10 mg Oral Q12H   pantoprazole  40 mg Oral Q2200   polyethylene glycol  17 g Oral Daily   sevelamer carbonate  1,600 mg Oral With snacks   sevelamer carbonate  4,000 mg Oral TID with meals   topiramate  25 mg Oral BID      Intake/Output from previous day: 10/05 0701 - 10/06 0700 In: 615 [P.O.:300; Blood:315] Out: 1500  Intake/Output this shift: No intake/output data recorded.   Lab Results: Recent Labs    11/15/20 0033 11/16/20 0850 11/16/20 2118  WBC 12.4* 9.7 10.1  HGB 7.0* 5.4* 7.4*  HCT 22.6* 17.3* 23.3*  PLT 260 217 259    BMET Recent Labs    11/14/20 1431 11/15/20 0033 11/16/20 0850  NA 137 135 132*  K 3.8 4.8 4.5  CL 98 97* 94*  CO2  --  25 22  GLUCOSE 70 103* 88  BUN 29* 34* 61*  CREATININE 7.50* 7.64* 9.55*  CALCIUM  --  8.0* 7.7*   LFT Recent Labs    11/16/20 0850  ALBUMIN 2.2*   PT/INR No results for input(s): LABPROT, INR in the last 72 hours. Hepatitis Panel  Recent Labs    11/16/20 0705  HEPBSAG NON REACTIVE     . CBC Latest Ref Rng & Units 11/16/2020 11/16/2020 11/15/2020  WBC 4.0 - 10.5 K/uL 10.1 9.7 12.4(H)  Hemoglobin 13.0 - 17.0 g/dL 7.4(L) 5.4(LL) 7.0(L)  Hematocrit 39.0 - 52.0 % 23.3(L) 17.3(L) 22.6(L)  Platelets 150 - 400 K/uL 259 217 260    . CMP Latest Ref Rng & Units 11/16/2020 11/15/2020 11/14/2020  Glucose 70 - 99 mg/dL 88 103(H) 70  BUN 8 - 23 mg/dL 61(H) 34(H) 29(H)  Creatinine 0.61 - 1.24 mg/dL 9.55(H) 7.64(H) 7.50(H)  Sodium 135 - 145 mmol/L 132(L) 135 137  Potassium 3.5 - 5.1 mmol/L 4.5 4.8 3.8  Chloride 98 - 111 mmol/L 94(L) 97(L) 98  CO2 22 - 32 mmol/L 22 25 -  Calcium 8.9 - 10.3 mg/dL 7.7(L) 8.0(L) -  Total Protein 6.5 - 8.1 g/dL - - -  Total Bilirubin 0.3 - 1.2 mg/dL - - -  Alkaline Phos 38 - 126 U/L - - -  AST 15 - 41 U/L - - -  ALT 0 - 44 U/L - - -   Studies/Results: No results found.  Principal Problem:   Primary osteoarthritis of left hip Active Problems:   GERD (gastroesophageal reflux disease)   History of lacunar cerebrovascular accident (CVA)   Anemia of renal disease   HLD (hyperlipidemia)   Essential hypertension   ESRD on hemodialysis ([place])   Obstructive sleep apnea   Type 2 diabetes mellitus with other specified complication ([place])   Status post total replacement of left hip   Left hip pain    Carl Savoy, NP-C @  11/17/2020, 11:59 AM   I have taken a history, reviewed the chart and examined the patient. I performed a substantive portion of this encounter, including complete performance of at least one of the key  components, in conjunction with the APP. I agree with the APP's note, impression and recommendations  60 year old male with ESRD, OA and history of PUD admitted for L THA on Oct 3rd, has experienced serial decline in Hgb over the past 2 days from a baseline of 10 to 7 to 5 today, with dark bowel movements with positive FOBT.  BUN progressively elevated, but significance limited by patient's dialysis status.   No other GI symptoms such as abdominal pain, nausea/vomiting, diarrhea.  He had been taking 'lots' of OTC pain meds in the days/weeks leading up to his surgery.  He has a history of bleeding gastric ulcers in 2021 secondary to NSAIDs. He has been hemodynamically stable.  Hgb responded to 1 unit pRBCs yesterday. Presentation most consistent with a bleeding peptic ulcer.  Plan for EGD tomorrow.  Will touch base with ortho to make sure left lateral decubitus position is okay for his procedure.  Acute blood loss anemia/suspected UGIB -- Clears today.  NPO after midnight -  Protonix 40 mg BID -- EGD tomorrow after dialysis. - High risk for DVT, but also bleeding; lovenox may pose less bleeding risk than ASA from PUD  Aysia Lowder E. Candis Schatz, MD [facility]

## 2020-11-17 NOTE — Progress Notes (Signed)
Physical Therapy Treatment Patient Details Name: Carl Gater Sr. MRN: [id number] DOB: [birth year] Today's Date: 11/17/2020   History of Present Illness Patient is a 60 y/o male who presents s/p left THA 11/14/20 direct anterior approach. PMH includes TIA, ESRD, DM, HTN, Rt TSA, depression, anxiety, stroke, seizures.    PT Comments    Pt was seen for mobility on RW and then exercise instruction in the chair.  He is in some pain and fatigue factoring his tolerance to move, but nursing brought pain meds and pt used his polar care for management of symptoms.  He is barely safe with gait this AM and will continue to recommend him to SNF for completion of therapy objectives.  MD has the final decision on his disposition, so will move forward with gait and transfers as tolerated.  See BID as he tolerates.   Recommendations for follow up therapy are one component of a multi-disciplinary discharge planning process, led by the attending physician.  Recommendations may be updated based on patient status, additional functional criteria and insurance authorization.  Follow Up Recommendations  Follow surgeon's recommendation for DC plan and follow-up therapies;Supervision for mobility/OOB     Equipment Recommendations  Rolling walker with 5" wheels    Recommendations for Other Services       Precautions / Restrictions Precautions Precautions: Fall Precaution Comments: wound vac Restrictions Weight Bearing Restrictions: Yes LLE Weight Bearing: Weight bearing as tolerated     Mobility  Bed Mobility               General bed mobility comments: up in chair when PT arrives    Transfers Overall transfer level: Needs assistance Equipment used: Rolling walker (2 wheeled);1 person hand held assist Transfers: Sit to/from [facility] Sit to Stand: Min assist Stand pivot transfers: Min assist       General transfer comment: pt is struggling with discomfort but can make the  transition to chair  Ambulation/Gait Ambulation/Gait assistance: Min guard;Min assist Gait Distance (Feet): 10 Feet Assistive device: Rolling walker (2 wheeled) Gait Pattern/deviations: Decreased stance time - left;Step-through pattern;Narrow base of support;Decreased step length - left;Trunk flexed Gait velocity: decreased Gait velocity interpretation: <1.31 ft/sec, indicative of household ambulator General Gait Details: pt walks one step at a time, very hesitant and complaining of pain on L hip.  Did not want to sit but finally asked him to sit due to his complaints   Stairs             Wheelchair Mobility    Modified Rankin (Stroke Patients Only)       Balance Overall balance assessment: Needs assistance Sitting-balance support: Feet supported;No upper extremity supported Sitting balance-Leahy Scale: Good     Standing balance support: Bilateral upper extremity supported;During functional activity Standing balance-Leahy Scale: Poor Standing balance comment: pt is ver slow to move, standing often this AM                            Cognition Arousal/Alertness: Awake/alert Behavior During Therapy: WFL for tasks assessed/performed Overall Cognitive Status: Within Functional Limits for tasks assessed                                 General Comments: pt is perseverating on trying to move but delaying      Exercises Total Joint Exercises Ankle Circles/Pumps: AROM;5 reps Quad Sets: AROM;5 reps Gluteal  Sets: AROM;10 reps General Exercises - Lower Extremity Hip ABduction/ADduction: AAROM;10 reps    General Comments General comments (skin integrity, edema, etc.): Pt is demonstrating low tolerance to stand and walk despite the progression.  Have encouraged him to use ice and do ex's      Pertinent Vitals/Pain Pain Assessment: Faces Faces Pain Scale: Hurts even more Pain Location: left knee/hip Pain Descriptors / Indicators:  Guarding;Grimacing Pain Intervention(s): Limited activity within patient's tolerance;Monitored during session;Premedicated before session;Repositioned;Ice applied    Home Living                      Prior Function            PT Goals (current goals can now be found in the care plan section) Acute Rehab PT Goals Patient Stated Goal: to get stronger and go home Progress towards PT goals: Progressing toward goals    Frequency    7X/week      PT Plan Current plan remains appropriate    Co-evaluation              AM-PAC PT "6 Clicks" Mobility   Outcome Measure  Help needed turning from your back to your side while in a flat bed without using bedrails?: A Little Help needed moving from lying on your back to sitting on the side of a flat bed without using bedrails?: A Little Help needed moving to and from a bed to a chair (including a wheelchair)?: A Little Help needed standing up from a chair using your arms (e.g., wheelchair or bedside chair)?: A Little Help needed to walk in hospital room?: A Little Help needed climbing 3-5 steps with a railing? : A Lot 6 Click Score: 17    End of Session Equipment Utilized During Treatment: Gait belt Activity Tolerance: Patient tolerated treatment well Patient left: in bed;with call bell/phone within reach;with bed alarm set;with nursing/sitter in room Nurse Communication: Mobility status PT Visit Diagnosis: Pain;Muscle weakness (generalized) (M62.81);Difficulty in walking, not elsewhere classified (R26.2);History of falling (Z91.81) Pain - Right/Left: Left Pain - part of body: Knee;Hip     Time: 1035-1100 PT Time Calculation (min) (ACUTE ONLY): 25 min  Charges:  $Gait Training: 8-22 mins $Therapeutic Exercise: 8-22 mins                Ramond Dial 11/17/2020, 5:11 PM  Mee Hives, PT PhD Acute Rehab Dept. Number: [place] and [place]

## 2020-11-17 NOTE — Progress Notes (Signed)
Subjective: 3 Days Post-Op Procedure(s) (LRB): LEFT TOTAL HIP ARTHROPLASTY ANTERIOR APPROACH (Left) Patient reports pain as mild.  Doing well from hip standpoint.  Hemoglobin post-op dropped from 9.2>7.0> 5.4.  had blood transfusion yesterday during/after hd.  Hemoglobin now 7.2.  currently asymptomatic.  Fobt positive.  Sounds like he will be getting scoped to find the source.     Objective: Vital signs in last 24 hours: Temp:  [98.1 F (36.7 C)-98.6 F (37 C)] 98.6 F (37 C) (10/05 2106) Pulse Rate:  [73-100] 93 (10/05 2106) Resp:  [11-17] 17 (10/05 2106) BP: (93-131)/(59-77) 123/76 (10/05 2106) SpO2:  [100 %] 100 % (10/05 2106) Weight:  [86.7 kg-88.2 kg] 86.7 kg (10/05 1200)  Intake/Output from previous day: 10/05 0701 - 10/06 0700 In: 615 [P.O.:300; Blood:315] Out: 1500  Intake/Output this shift: No intake/output data recorded.  Recent Labs    11/14/20 1431 11/15/20 0033 11/16/20 0850 11/16/20 2118  HGB 9.2* 7.0* 5.4* 7.4*   Recent Labs    11/16/20 0850 11/16/20 2118  WBC 9.7 10.1  RBC 1.99* 2.67*  2.63*  HCT 17.3* 23.3*  PLT 217 259   Recent Labs    11/15/20 0033 11/16/20 0850  NA 135 132*  K 4.8 4.5  CL 97* 94*  CO2 25 22  BUN 34* 61*  CREATININE 7.64* 9.55*  GLUCOSE 103* 88  CALCIUM 8.0* 7.7*   No results for input(s): LABPT, INR in the last 72 hours.  Neurologically intact Neurovascular intact Sensation intact distally Intact pulses distally Dorsiflexion/Plantar flexion intact Incision: dressing C/D/I No cellulitis present Compartment soft Wound vac in place and functioning properly.  Still no fluid in canister   Assessment/Plan: 3 Days Post-Op Procedure(s) (LRB): LEFT TOTAL HIP ARTHROPLASTY ANTERIOR APPROACH (Left) Up with therapy WBAT LLE ABLA on chronic anemia.  Hemoglobin post-op dropped from 9.2>7.0> 5.4.  had blood transfusion yesterday during/after hd.  Hemoglobin now 7.2.  currently asymptomatic.   Fobt positive.  Will defer  further treatment to hospitalist.  Ok to d/c from ortho standpoint when medically stable Please swap home vac unit to portable unit prior to discharge F/u with Dr. Erlinda Hong 2 weeks post-op       Aundra Dubin 11/17/2020, 8:06 AM

## 2020-11-17 NOTE — Progress Notes (Signed)
Physical Therapy Treatment Patient Details Name: Carl Morrish Sr. MRN: [id number] DOB: 04-07-1960 Today's Date: 11/17/2020   History of Present Illness Patient is a 60 y/o male who presents s/p left THA 11/14/20 direct anterior approach. PMH includes TIA, ESRD, DM, HTN, Rt TSA, depression, anxiety, stroke, seizures.    PT Comments    Pt was seen for mobility but due to his hgb and fatigue, did bed level visit.  He is visiting with family who are all supportive and helpful.  Pt is going to receive HD and endoscopic assessment for suspected GI bleed tomorrow, and will recommend a creative attempt to get at least one session in for him as he can allow.  Pt leaves at 6AM for HD per his report, and will need to try to get seen late AM if PT is going to avoid running into scope procedure time. Follow up as time allows with his schedule.  Recommendations for follow up therapy are one component of a multi-disciplinary discharge planning process, led by the attending physician.  Recommendations may be updated based on patient status, additional functional criteria and insurance authorization.  Follow Up Recommendations  Follow surgeon's recommendation for DC plan and follow-up therapies;Supervision for mobility/OOB     Equipment Recommendations  Rolling walker with 5" wheels    Recommendations for Other Services       Precautions / Restrictions Precautions Precautions: Fall Precaution Comments: wound vac Restrictions Weight Bearing Restrictions: Yes LLE Weight Bearing: Weight bearing as tolerated     Mobility  Bed Mobility Overal bed mobility: Needs Assistance Bed Mobility:  (scooting up)           General bed mobility comments: elevted foot of bed and pt scooted down to [facility] with no assistance    Transfers Overall transfer level: Needs assistance Equipment used: Rolling walker (2 wheeled);1 person hand held assist Transfers: Sit to/from [facility] Sit to  Stand: Min assist Stand pivot transfers: Min assist       General transfer comment: declined  Ambulation/Gait Ambulation/Gait assistance: Min guard;Min assist Gait Distance (Feet): 10 Feet Assistive device: Rolling walker (2 wheeled) Gait Pattern/deviations: Decreased stance time - left;Step-through pattern;Narrow base of support;Decreased step length - left;Trunk flexed Gait velocity: decreased Gait velocity interpretation: <1.31 ft/sec, indicative of household ambulator General Gait Details: pt walks one step at a time, very hesitant and complaining of pain on L hip.  Did not want to sit but finally asked him to sit due to his complaints   Stairs             Wheelchair Mobility    Modified Rankin (Stroke Patients Only)       Balance Overall balance assessment: Needs assistance Sitting-balance support: Feet supported;No upper extremity supported Sitting balance-Leahy Scale: Good     Standing balance support: Bilateral upper extremity supported;During functional activity Standing balance-Leahy Scale: Poor Standing balance comment: pt is ver slow to move, standing often this AM                            Cognition Arousal/Alertness: Awake/alert Behavior During Therapy: WFL for tasks assessed/performed Overall Cognitive Status: Within Functional Limits for tasks assessed                                 General Comments: pt agrees to bed exercises and is cooperative and in a good mood  Exercises Total Joint Exercises Ankle Circles/Pumps: AROM;5 reps Quad Sets: AROM;5 reps Gluteal Sets: AROM;10 reps General Exercises - Lower Extremity Ankle Circles/Pumps: AROM;5 reps Quad Sets: AROM;10 reps Gluteal Sets: AROM;10 reps Heel Slides: AAROM;AROM;10 reps Hip ABduction/ADduction: AAROM;AROM;10 reps Straight Leg Raises: AAROM;AROM;10 reps    General Comments General comments (skin integrity, edema, etc.): pt was assisted to do LE  exercises, and made a great effort to push through and complete them      Pertinent Vitals/Pain Pain Assessment: Faces Faces Pain Scale: Hurts little more Pain Location: left knee/hip Pain Descriptors / Indicators: Operative site guarding;Tender Pain Intervention(s): Monitored during session;Premedicated before session;Repositioned;Ice applied    Home Living                      Prior Function            PT Goals (current goals can now be found in the care plan section) Acute Rehab PT Goals Patient Stated Goal: go home when ready Progress towards PT goals: Progressing toward goals    Frequency    7X/week      PT Plan Current plan remains appropriate    Co-evaluation              AM-PAC PT "6 Clicks" Mobility   Outcome Measure  Help needed turning from your back to your side while in a flat bed without using bedrails?: A Little Help needed moving from lying on your back to sitting on the side of a flat bed without using bedrails?: A Little Help needed moving to and from a bed to a chair (including a wheelchair)?: A Little Help needed standing up from a chair using your arms (e.g., wheelchair or bedside chair)?: A Little Help needed to walk in hospital room?: A Little Help needed climbing 3-5 steps with a railing? : A Lot 6 Click Score: 17    End of Session Equipment Utilized During Treatment: Gait belt Activity Tolerance: Patient tolerated treatment well Patient left: in bed;with call bell/phone within reach;with bed alarm set;with nursing/sitter in room Nurse Communication: Mobility status PT Visit Diagnosis: Pain;Muscle weakness (generalized) (M62.81);Difficulty in walking, not elsewhere classified (R26.2);History of falling (Z91.81) Pain - Right/Left: Left Pain - part of body: Hip;Knee     Time: 5248-1859 PT Time Calculation (min) (ACUTE ONLY): 28 min  Charges:  $Gait Training: 8-22 mins $Therapeutic Exercise: 8-22 mins $Therapeutic  Activity: 8-22 mins    Ramond Dial 11/17/2020, 5:20 PM  Mee Hives, PT PhD Acute Rehab Dept. Number: [place] and [place]

## 2020-11-17 NOTE — Progress Notes (Addendum)
  [facility] Progress Note   Subjective:  Seen in room - did fine with dialysis yesterday. No CP/dyspnea. Ended up getting 1U PRBCs yesterday with Hgb down to 5.4. FOBT checked -> positive. Sounds like GI was consulted per patient description.  Objective Vitals:   11/16/20 1200 11/16/20 1245 11/16/20 1522 11/16/20 2106  BP: 131/69 112/77 111/73 123/76  Pulse: 83 94 100 93  Resp: 17 16 16 17   Temp: 98.5 F (36.9 C) 98.5 F (36.9 C) 98.4 F (36.9 C) 98.6 F (37 C)  TempSrc: Oral Oral Oral Oral  SpO2: 100% 100% 100% 100%  Weight: 86.7 kg     Height:       Physical Exam General: Well appearing man, NAD. Room air. Heart: RRR; no murmur Lungs: CTA anteriorly Abdomen: soft, non-tender Extremities: No LE edema; wound vac to anterior L hip - no drainage/machine off at the moment. Dialysis Access: R forearm AVF + bruit  Additional Objective Labs: Basic Metabolic Panel: Recent Labs  Lab 11/14/20 1431 11/15/20 0033 11/16/20 0850  NA 137 135 132*  K 3.8 4.8 4.5  CL 98 97* 94*  CO2  --  25 22  GLUCOSE 70 103* 88  BUN 29* 34* 61*  CREATININE 7.50* 7.64* 9.55*  CALCIUM  --  8.0* 7.7*  PHOS  --   --  4.6   Liver Function Tests: Recent Labs  Lab 11/16/20 0850  ALBUMIN 2.2*   CBC: Recent Labs  Lab 11/15/20 0033 11/16/20 0850 11/16/20 2118  WBC 12.4* 9.7 10.1  NEUTROABS 11.3*  --   --   HGB 7.0* 5.4* 7.4*  HCT 22.6* 17.3* 23.3*  MCV 88.3 86.9 87.3  PLT 260 217 259   Iron Studies:  Recent Labs    11/16/20 2118  IRON 23*  TIBC 157*  FERRITIN 1,472*   Medications:  sodium chloride Stopped (11/15/20 1856)   methocarbamol (ROBAXIN) IV      aspirin  81 mg Oral BID   atorvastatin  10 mg Oral QPM   camphor-menthol   Topical BID   Chlorhexidine Gluconate Cloth  6 each Topical Q0600   cinacalcet  120 mg Oral Q supper   darbepoetin (ARANESP) injection - DIALYSIS  200 mcg Intravenous Q Wed-HD   docusate sodium  100 mg Oral BID   midodrine  10 mg  Oral Q M,W,F   oxyCODONE  10 mg Oral Q12H   pantoprazole  40 mg Oral Q2200   polyethylene glycol  17 g Oral Daily   sevelamer carbonate  1,600 mg Oral With snacks   sevelamer carbonate  4,000 mg Oral TID with meals   topiramate  25 mg Oral BID    Dialysis Orders: MWF at [facility] 4hr, 400/500, EDW 88kg, 2K/2.5Ca, AVF, heparin 2000 bolus - Mircera 117mcg IV q 2 weeks (last 9/21 for Hgb 7.7)   Assessment/Plan:  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  ESRD:  Continue HD per MWF schedule - next HD tomorrow.  BP/volume: BP controlled, no edema on exam. Uses midodrine for HD.  Anemia (ESRD + GIB): Hgb 7 -> 5.4, agreed for 1U PRBCs, given on 10/5. FOBT positive. Hgb up to 7.4 today. S/p Aranesp 217mcg on 10/4. Tsat low - allergy to IV iron listed in chart.  Metabolic bone disease: Ca/Phos good - continue home binders/sensipar.  T2DM   Hx CVA    Veneta Penton, Hershal Coria 11/17/2020, 10:22 AM  [employer]

## 2020-11-17 NOTE — Progress Notes (Signed)
PROGRESS NOTE  Carl Roblero Sr. ZOX:[id number] DOB: 10-09-1960 DOA: 11/14/2020 PCP: [name], MD  HPI/Recap of past 24 hours:  60-year with history of ESRD on HD Monday Wednesday Friday, HTN, DM2, TIA, lacunar CVA, GERD, anemia of chronic disease, HLD, OSA, osteoarthritis of the hip admitted for hip replacement.  Medical team consulted for medication management. Care transferred to [facility] on 11/16/2020.  Hospital course complicated by symptomatic anemia with hemoglobin as low as 5.4 with positive FOBT.  He was transfused 1 unit PRBC at hemodialysis on 11/16/2020.  Hemoglobin this morning 7.3.  GI consulted, appreciate assistance.  11/17/2020: Seen and examined at bedside.  He is concerned about his anemia.  Plan for EGD tomorrow after hemodialysis.  Clear liquid diet today, n.p.o. after midnight.  Protonix started by GI 40 mg twice daily.  Assessment/Plan: Principal Problem:   Primary osteoarthritis of left hip Active Problems:   GERD (gastroesophageal reflux disease)   History of lacunar cerebrovascular accident (CVA)   Anemia of renal disease   HLD (hyperlipidemia)   Essential hypertension   ESRD on hemodialysis ([place])   Obstructive sleep apnea   Type 2 diabetes mellitus with other specified complication (HCC)   Status post total replacement of left hip   Left hip pain  Primary osteoarthritis of left hip s/p total hip arthroplasty on 11/15/2020. -Status post left hip total arthroplasty.  Pain management and postop DVT prophylaxis per orthopedic Pain management PT OT assessment Fall precautions  Acute blood loss anemia, positive FOBT, iron deficiency, anemia of chronic disease. Hemoglobin dropped to 5.4, transfused 1 unit PRBC at hemodialysis on 11/16/2020 Positive FOBT. Evidence of iron deficiency on iron studies. Transfuse additional 1 unit PRBC if patient is agreeable. GI consulted and following. Plan for EGD tomorrow 11/18/2020 after hemodialysis. Per GI, subcu Lovenox  preferable than aspirin twice daily for DVT prophylaxis unconcerned about PUD, will check with surgery.   Acute left ankle pain -Some soft tissue swelling.  No evidence of infection.  Uric acid levels are normal.  Continue to monitor, pain control.  Orthopedic follow-up Pain management    ESRD on hemodialysis ([place]) -HD MWF.  Nephrology following. Completed HD on 11/16/2020     GERD (gastroesophageal reflux disease) IV Protonix 40 mg twice daily due to concern for upper GI bleed.     History of lacunar cerebrovascular accident (CVA) On aspirin 81 mg twice daily for DVT prophylaxis, will switch to subcu Lovenox if okay with surgery. Continue home Lipitor PT OT assessment, recommendation for home health PT     HLD (hyperlipidemia) - Continue home Lipitor     Essential hypertension Continue home midodrine MWF     Obstructive sleep apnea -Not on home CPAP.  Dry skin from ESRD Possible uremic pruritus As needed p.o. Benadryl and Sarna lotion     Code Status: Full code        Code Status: Full code  Family Communication: None at bedside  Disposition Plan: Likely will discharge to home with home health services.   Consultants: Nephrology Orthopedic surgery GI.  Procedures: HD on 11/16/2020. Plan for HD on 11/18/2020  Antimicrobials: None.  DVT prophylaxis: Aspirin 81 mg twice daily.  Status is: Inpatient    Dispo: The patient is from: Home.              Anticipated d/c is to: Home with home health services.              Patient currently not medically stable for discharge.  Difficult to place patient not applicable.        Objective: Vitals:   11/16/20 1522 11/16/20 2106 11/17/20 0900 11/17/20 1459  BP: 111/73 123/76 132/68 137/82  Pulse: 100 93 64 79  Resp: 16 17  17   Temp: 98.4 F (36.9 C) 98.6 F (37 C) 98.2 F (36.8 C) 97.9 F (36.6 C)  TempSrc: Oral Oral Oral Oral  SpO2: 100% 100% 98% 98%  Weight:      Height:       No intake or  output data in the 24 hours ending 11/17/20 1721  Filed Weights   11/14/20 1357 11/16/20 0839 11/16/20 1200  Weight: 88.1 kg 88.2 kg 86.7 kg    Exam:  General: 60 y.o. year-old male well-developed well-nourished in no acute distress.  He is alert oriented x3.   Cardiovascular: Regular rate and rhythm no rubs or gallops.   Respiratory: Clear to auscultation with no wheezes or rales.   Abdomen: Soft nontender normal bowel sounds present. Musculoskeletal: No lower extremity edema bilaterally.   Skin: No ulcerative lesions noted. Psychiatry: Mood is appropriate for condition and setting.   Data Reviewed: CBC: Recent Labs  Lab 11/14/20 1431 11/15/20 0033 11/16/20 0850 11/16/20 2118 11/17/20 1345  WBC  --  12.4* 9.7 10.1 9.8  NEUTROABS  --  11.3*  --   --   --   HGB 9.2* 7.0* 5.4* 7.4* 7.3*  HCT 27.0* 22.6* 17.3* 23.3* 22.8*  MCV  --  88.3 86.9 87.3 88.4  PLT  --  260 217 259 425   Basic Metabolic Panel: Recent Labs  Lab 11/14/20 1431 11/15/20 0033 11/16/20 0850  NA 137 135 132*  K 3.8 4.8 4.5  CL 98 97* 94*  CO2  --  25 22  GLUCOSE 70 103* 88  BUN 29* 34* 61*  CREATININE 7.50* 7.64* 9.55*  CALCIUM  --  8.0* 7.7*  PHOS  --   --  4.6   GFR: Estimated Creatinine Clearance: 9.1 mL/min (A) (by C-G formula based on SCr of 9.55 mg/dL (H)). Liver Function Tests: Recent Labs  Lab 11/16/20 0850  ALBUMIN 2.2*   No results for input(s): LIPASE, AMYLASE in the last 168 hours. No results for input(s): AMMONIA in the last 168 hours. Coagulation Profile: No results for input(s): INR, PROTIME in the last 168 hours.  Cardiac Enzymes: No results for input(s): CKTOTAL, CKMB, CKMBINDEX, TROPONINI in the last 168 hours. BNP (last 3 results) No results for input(s): PROBNP in the last 8760 hours. HbA1C: Recent Labs    11/15/20 0033  HGBA1C 4.8   CBG: No results for input(s): GLUCAP in the last 168 hours. Lipid Profile: No results for input(s): CHOL, HDL, LDLCALC, TRIG,  CHOLHDL, LDLDIRECT in the last 72 hours. Thyroid Function Tests: No results for input(s): TSH, T4TOTAL, FREET4, T3FREE, THYROIDAB in the last 72 hours. Anemia Panel: Recent Labs    11/16/20 2118  FERRITIN 1,472*  TIBC 157*  IRON 23*  RETICCTPCT 1.1   Urine analysis:    Component Value Date/Time   COLORURINE YELLOW 08/06/2006 0806   APPEARANCEUR CLEAR 08/06/2006 0806   LABSPEC 1.013 08/06/2006 0806   PHURINE 8.0 08/06/2006 0806   GLUCOSEU 100 (A) 08/06/2006 0806   HGBUR MODERATE (A) 08/06/2006 0806   BILIRUBINUR NEGATIVE 08/06/2006 0806   KETONESUR NEGATIVE 08/06/2006 0806   PROTEINUR > 300 08/06/2006 0806   UROBILINOGEN 0.2 08/06/2006 0806   NITRITE NEGATIVE 08/06/2006 0806   LEUKOCYTESUR SMALL 08/06/2006 9563  Sepsis Labs: @LABRCNTIP (procalcitonin:4,lacticidven:4)  ) Recent Results (from the past 240 hour(s))  Surgical pcr screen     Status: None   Collection Time: 11/10/20  8:59 AM   Specimen: Nasal Mucosa; Nasal Swab  Result Value Ref Range Status   MRSA, PCR NEGATIVE NEGATIVE Final   Staphylococcus aureus NEGATIVE NEGATIVE Final    Comment: (NOTE) The Xpert SA Assay (FDA approved for NASAL specimens in patients 38 years of age and older), is one component of a comprehensive surveillance program. It is not intended to diagnose infection nor to guide or monitor treatment. Performed at [facility], [place] [address] [place], [place] [address]   SARS CORONAVIRUS 2 (TAT 6-24 HRS) Nasopharyngeal Nasopharyngeal Swab     Status: None   Collection Time: 11/10/20  9:00 AM   Specimen: Nasopharyngeal Swab  Result Value Ref Range Status   SARS Coronavirus 2 NEGATIVE NEGATIVE Final    Comment: (NOTE) SARS-CoV-2 target nucleic acids are NOT DETECTED.  The SARS-CoV-2 RNA is generally detectable in upper and lower respiratory specimens during the acute phase of infection. Negative results do not preclude SARS-CoV-2 infection, do not rule out co-infections with other  pathogens, and should not be used as the sole basis for treatment or other patient management decisions. Negative results must be combined with clinical observations, patient history, and epidemiological information. The expected result is Negative.  Fact Sheet for Patients: SugarRoll.be  Fact Sheet for Healthcare Providers: https://www.woods-mathews.com/  This test is not yet approved or cleared by the [place] FDA and  has been authorized for detection and/or diagnosis of SARS-CoV-2 by FDA under an Emergency Use Authorization (EUA). This EUA will remain  in effect (meaning this test can be used) for the duration of the COVID-19 declaration under Se ction 564(b)(1) of the Act, 21 U.S.C. section 360bbb-3(b)(1), unless the authorization is terminated or revoked sooner.  Performed at [facility], [place] [address], [place], [place] [address]       Studies: No results found.  Scheduled Meds:  aspirin  81 mg Oral BID   atorvastatin  10 mg Oral QPM   camphor-menthol   Topical BID   Chlorhexidine Gluconate Cloth  6 each Topical Q0600   cinacalcet  120 mg Oral Q supper   darbepoetin (ARANESP) injection - DIALYSIS  200 mcg Intravenous Q Wed-HD   docusate sodium  100 mg Oral BID   midodrine  10 mg Oral Q M,W,F   oxyCODONE  10 mg Oral Q12H   pantoprazole (PROTONIX) IV  40 mg Intravenous Q12H   polyethylene glycol  17 g Oral Daily   sevelamer carbonate  1,600 mg Oral With snacks   sevelamer carbonate  4,000 mg Oral TID with meals   topiramate  25 mg Oral BID    Continuous Infusions:  sodium chloride Stopped (11/15/20 4503)   methocarbamol (ROBAXIN) IV       LOS: 1 day     Kayleen Memos, MD [facility] Pager [phone]  If 7PM-7AM, please contact night-coverage [email or web address] Password [email or web address] 11/17/2020, 5:21 PM

## 2020-11-17 NOTE — Evaluation (Signed)
Occupational Therapy Evaluation Patient Details Name: Carl Ju Sr. MRN: [id number] DOB: Dec 25, 1960 Today's Date: 11/17/2020   History of Present Illness Patient is a 60 y/o male who presents s/p left THA 11/14/20 direct anterior approach. PMH includes TIA, ESRD, DM, HTN, Rt TSA, depression, anxiety, stroke, seizures.   Clinical Impression   Pt presents with decreased balance/mobility/activity tolerance and L hip pain. Pt currently requiring Min A - Mod A with LB dressing and bathing and Min guard for functional transfers/mobility. Pt with intermittent support available upon d/c home and should be safe to return home once medically clear. No further skilled OT needs upon d/c. Will follow acutely to improve safety/independence with ADLs.      Recommendations for follow up therapy are one component of a multi-disciplinary discharge planning process, led by the attending physician.  Recommendations may be updated based on patient status, additional functional criteria and insurance authorization.   Follow Up Recommendations  No OT follow up    Equipment Recommendations  3 in 1 bedside commode    Recommendations for Other Services       Precautions / Restrictions Precautions Precautions: Fall Precaution Comments: wound vac Restrictions Weight Bearing Restrictions: Yes LLE Weight Bearing: Weight bearing as tolerated      Mobility Bed Mobility Overal bed mobility: Needs Assistance Bed Mobility: Rolling Rolling: Min guard Sidelying to sit: Min guard;HOB elevated       General bed mobility comments: pt assisting LLE to move    Transfers                      Balance Overall balance assessment: Needs assistance Sitting-balance support: Feet supported;No upper extremity supported Sitting balance-Leahy Scale: Good     Standing balance support: During functional activity Standing balance-Leahy Scale: Poor Standing balance comment: Requires UE support in  standing.                           ADL either performed or assessed with clinical judgement   ADL Overall ADL's : Needs assistance/impaired Eating/Feeding: Independent   Grooming: Wash/dry hands;Wash/dry face;Oral care;Independent   Upper Body Bathing: Independent;Sitting   Lower Body Bathing: Minimal assistance;With adaptive equipment   Upper Body Dressing : Independent   Lower Body Dressing: Moderate assistance;Sit to/from stand   Toilet Transfer: Supervision/safety;Comfort height toilet;RW;Grab bars   Toileting- Clothing Manipulation and Hygiene: Independent;Sit to/from stand   Tub/ Banker: 3 in 1;Rolling walker;Min guard   Functional mobility during ADLs: Min guard;Rolling walker General ADL Comments: Pt limited by decreased balance/mobility/activity toelrance and pain     Vision   Vision Assessment?: No apparent visual deficits     Perception     Praxis      Pertinent Vitals/Pain Pain Assessment: 0-10 Pain Score: 8  Pain Location: left knee/hip Pain Descriptors / Indicators: Guarding;Grimacing Pain Intervention(s): Limited activity within patient's tolerance;Repositioned;Monitored during session     Hand Dominance Right   Extremity/Trunk Assessment Upper Extremity Assessment Upper Extremity Assessment: Overall WFL for tasks assessed   Lower Extremity Assessment Lower Extremity Assessment: Defer to PT evaluation   Cervical / Trunk Assessment Cervical / Trunk Assessment: Normal   Communication Communication Communication: No difficulties   Cognition Arousal/Alertness: Awake/alert Behavior During Therapy: WFL for tasks assessed/performed Overall Cognitive Status: Within Functional Limits for tasks assessed  General Comments       Exercises     Shoulder Instructions      Home Living Family/patient expects to be discharged to:: Private residence Living Arrangements: Other  relatives Available Help at Discharge: Family;Available PRN/intermittently Type of Home: House Home Access: Stairs to enter Entrance Stairs-Number of Steps: 1 + 1   Home Layout: One level     Bathroom Shower/Tub: Occupational psychologist: Handicapped height     Home Equipment: Toilet riser;Walker - 4 wheels;Wheelchair - manual          Prior Functioning/Environment Level of Independence: Independent with assistive device(s)        Comments: Uses rollator for ambulation. Drives some. Son drives pt to HD. Was able to dress self/bath with increased time/difficulty. Does a little bit of IADLs. Was getting OPPT prior to surgery        OT Problem List: Decreased strength;Decreased activity tolerance;Impaired balance (sitting and/or standing);Pain      OT Treatment/Interventions: Self-care/ADL training;Therapeutic exercise;Neuromuscular education;Therapeutic activities;Balance training;Patient/family education    OT Goals(Current goals can be found in the care plan section) Acute Rehab OT Goals Patient Stated Goal: to get stronger and go home OT Goal Formulation: With patient Time For Goal Achievement: 12/01/20 Potential to Achieve Goals: Good  OT Frequency: Min 2X/week   Barriers to D/C:            Co-evaluation              AM-PAC OT "6 Clicks" Daily Activity     Outcome Measure Help from another person eating meals?: None Help from another person taking care of personal grooming?: None Help from another person toileting, which includes using toliet, bedpan, or urinal?: A Little Help from another person bathing (including washing, rinsing, drying)?: A Little Help from another person to put on and taking off regular upper body clothing?: None Help from another person to put on and taking off regular lower body clothing?: A Lot 6 Click Score: 20   End of Session Equipment Utilized During Treatment: Gait belt;Rolling walker Nurse Communication: Mobility  status  Activity Tolerance: Patient limited by pain Patient left: in chair;with call bell/phone within reach;with chair alarm set  OT Visit Diagnosis: Unsteadiness on feet (R26.81);Muscle weakness (generalized) (M62.81);Pain Pain - Right/Left: Left Pain - part of body: Hip                Time: 4098-1191 OT Time Calculation (min): 49 min Charges:  OT General Charges $OT Visit: 1 Visit OT Evaluation $OT Eval Moderate Complexity: 1 Mod OT Treatments $Self Care/Home Management : 23-37 mins  Kalii Chesmore C, OT/L  Acute Rehab [place] 11/17/2020, 10:08 AM

## 2020-11-17 NOTE — H&P (View-Only) (Signed)
Referring Provider:  [facility]         Primary Care Physician:  [name], MD Primary Gastroenterologist: [name], MD    Reason for Consultation:  Anemia                 ASSESSMENT / PLAN   # 60 yo male with development of acute on chronic anemia, H+ stool. He is s/p left hip replacement a few days ago. Left hip looks okay without hematomas. He has a history of PUD and reports darker than normal stool this admission.  His getting ASA BID and prior to admission was taking Ibuprofen for hip pain. Rule out recurrent PUD.  --Will schedule for EGD to be done tomorrow afternoon ( patient says he has dialysis in am).  --Clears today, NPO after 6am.  --Increasing Protonix to BID --Hgb improved post 1 u PRBC from 5.4 >> 7.4.  Baseline hgb 10-11. No labs done today so will check CBC now and again in am.   # History of partial colectomy and small bowel resection for large cecal mass (non-cancerous) in 2017.   # ESRD on HD. Dialysis in am   HISTORY OF PRESENT ILLNESS                                                                                                                         Chief Complaint:  anemia  Carl Tierce Sr. is a 60 y.o. male with a past medical history significant for ESRD on HD, HTN, DM2 , HLD, OSA, CVA, diverticulosis, partial colectomy for inflammatory mass, GERD, PUD, anemia of chronic diseae. . See PMH for any additional medical history.   Patient admitted 11/14/2020 for surgery of the left hip for degenerative disease. On 10/3 he had a total hip replacement. Post-op his hgb declined from 9.2 >> 7 >> 5.4. Given Aransesp ( didn't wantblood products due to being a Jehovah's Witness). However he did  get a uPRBC with improvement in hemoglobin to 7.4 yesterday morning. MCV 87. He is FOBT+. On 81 mg asa BID, getting protonix qday  [name] feels okay. He has no nausea, vomiting or abdominal pain. He has had a couple of darker than usual bowel movements  this admission but thought it was related to meatloaf and gravy. Prior to admission he was taking Ibuprofen a couple of times a week for hip pain and thinks it may have flared up his stomach ulcer.   PREVIOUS ENDOSCOPIC EVALUATIONS   June 2017 colonoscopy for evaluation of hematochezia and abnormal CT scan -- Complete exam, good bowel prep.  Ulcerated, partially obstructing large mass in the proximal ascending colon very near the ICV.  Multiple diverticula in the ascending and sigmoid colon.  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  Biopsies done to rule out H. pylori.  Moderate patchy gastritis in the stomach.  Chronic duodenitis  June 2021 EGD for ulcer follow-up and recurrent decline in hgb -- Localized polypoid mucosa 5 to 7 mm in size with superficial erythema found in the gastric body.  No stigmata for bleeding.  Biopsies not taken given downtrending hemoglobin.  1 clean-based gastric ulcer with no stigmata of bleeding found at the pyloric channel, difficult to visualize.  Lesion was 4 to 5 mm in size.  No heme in the stomach or small bowel.  No endoscopic therapy was required and would be technically challenging anyway.  Patchy erythematous mucosa with hypertrophied folds was found in the duodenal bulb and in the second portion of the duodenum prolapsing in and out of the bulb.  No ulcerations or stigmata for bleeding noted   Past Medical History:  Diagnosis Date   Acute gastric ulcer with hemorrhage    Acute GI bleeding 07/31/2019   Acute pericarditis    Anemia    hx of   Anxiety    situational    Arthritis    on meds   Borderline diabetes    Cataract    bilateral sx   Chronic headaches    Depression    situational     Diverticulitis    ESRD (end stage renal disease) ([place])     On Renal Transplant List," [facility]; MWF" (10/23/2016)   GERD (gastroesophageal reflux disease)    with certain foods   GI bleed    Hypertension    diet controlled   Parathyroid abnormality ([place])    ectopic parathyroid gland   Presence of arteriovenous fistula for hemodialysis, primary ([place])    RUE   Refusal of blood product    NO WHOLE BLOOD PROUCTS   Renal cell carcinoma ([place])    s/p hand assisted laparoscopic bilateral nephrectomies 11/29/17, + RCC left   Secondary hyperparathyroidism ([place])    Seizures ([place])    one episode in past, due to" elevated Potassium" 08/02/20- "at least 4 years ago"   Sleep apnea    doesn't use CPAP anymore since weight loss   Stroke [facility])    no residual    Past Surgical History:  Procedure Laterality Date   AV FISTULA PLACEMENT Right    right arm   BIOPSY  08/01/2019   Procedure: BIOPSY;  Surgeon: Juanita Craver, MD;  Location: [facility] ENDOSCOPY;  Service: Endoscopy;;   CATARACT EXTRACTION W/ INTRAOCULAR LENS  IMPLANT, BILATERAL     COLON SURGERY     COLONOSCOPY N/A 08/04/2015   Procedure: COLONOSCOPY;  Surgeon: Jerene Bears, MD;  Location: [facility] ENDOSCOPY;  Service: Endoscopy;  Laterality: N/A;   COLONOSCOPY  2017   JMP@ [facility]-good prep-mass -recall 1 yr   ESOPHAGOGASTRODUODENOSCOPY N/A 08/01/2019   Procedure: ESOPHAGOGASTRODUODENOSCOPY (EGD);  Surgeon: Juanita Craver, MD;  Location: [facility] ENDOSCOPY;  Service: Endoscopy;  Laterality: N/A;   ESOPHAGOGASTRODUODENOSCOPY (EGD) WITH PROPOFOL N/A 08/04/2019   Procedure: ESOPHAGOGASTRODUODENOSCOPY (EGD) WITH PROPOFOL;  Surgeon: Yetta Flock, MD;  Location: [place];  Service: Gastroenterology;  Laterality: N/A;   graft left arm Left    for dialysis x 2. Removed   INSERTION OF DIALYSIS CATHETER     Rt chest   LAPAROSCOPIC RIGHT COLECTOMY N/A 08/05/2015   Procedure: LAPAROSCOPIC RIGHT COLECTOMY- ASCENDING;  Surgeon: Stark Klein, MD;  Location:  [place];  Service: General;  Laterality: N/A;   MASS EXCISION Left  05/28/2019   Procedure: EXCISION SOFT TISSUE MASS LEFT SHOULDER;  Surgeon: Armandina Gemma, MD;  Location: [facility];  Service: General;  Laterality: Left;   NEPHRECTOMY Bilateral    PARATHYROIDECTOMY N/A 06/12/2016   Procedure: TOTAL PARATHYROIDECTOMY WITH AUTOTRANSPLANTATION TO LEFT FOREARM;  Surgeon: Armandina Gemma, MD;  Location: [place];  Service: General;  Laterality: N/A;   PARATHYROIDECTOMY N/A 10/23/2016   Procedure: PARATHYROIDECTOMY;  Surgeon: Armandina Gemma, MD;  Location: [place];  Service: General;  Laterality: N/A;   REVERSE SHOULDER ARTHROPLASTY Right 08/24/2020   Procedure: REVERSE SHOULDER ARTHROPLASTY;  Surgeon: Hiram Gash, MD;  Location: [place];  Service: Orthopedics;  Laterality: Right;   REVISON OF ARTERIOVENOUS FISTULA Right 07/16/2017   Procedure: REVISION OF ARTERIOVENOUS FISTULA  Right ARM;  Surgeon: Waynetta Sandy, MD;  Location: [place];  Service: Vascular;  Laterality: Right;   TOTAL HIP ARTHROPLASTY Left 11/14/2020   Procedure: LEFT TOTAL HIP ARTHROPLASTY ANTERIOR APPROACH;  Surgeon: Leandrew Koyanagi, MD;  Location: [place];  Service: Orthopedics;  Laterality: Left;   UPPER GASTROINTESTINAL ENDOSCOPY  2021   @ [facility]    Prior to Admission medications   Medication Sig Start Date End Date Taking? Authorizing Provider  aspirin EC 81 MG tablet Take 1 tablet (81 mg total) by mouth 2 (two) times daily. To be taken after surgery Patient taking differently: Take 81 mg by mouth every other day. 11/08/20  Yes Aundra Dubin, PA-C  atorvastatin (LIPITOR) 10 MG tablet Take 10 mg by mouth every evening.   Yes [provider]  B Complex-C-Folic Acid (DIALYVITE PO) Take 1 tablet by mouth daily.   Yes [provider]  cinacalcet (SENSIPAR) 60 MG tablet Take 120 mg by mouth daily with supper.   Yes [provider]  cromolyn (OPTICROM) 4 % ophthalmic solution Place 1 drop into both eyes daily as  needed (redness).   Yes [provider]  ibuprofen (ADVIL) 800 MG tablet Take 800 mg by mouth 3 (three) times daily as needed for moderate pain.   Yes [provider]  LOKELMA 10 g PACK packet Take 1 packet by mouth daily. 03/11/19  Yes [provider]  midodrine (PROAMATINE) 10 MG tablet Take 10 mg by mouth See admin instructions. Take 10 mg by mouth three times a week as directed, bring to dialysis. May need an additional tablet during treatment.   Yes [provider]  omeprazole (PRILOSEC) 20 MG capsule Take 40 mg by mouth in the morning and at bedtime.   Yes [provider]  sevelamer carbonate (RENVELA) 800 MG tablet Take 1,600-4,000 mg by mouth See admin instructions. Take 4000 mg by mouth three times daily with meals and 1600-2400 mg with snacks 10/11/20  Yes [provider]  topiramate (TOPAMAX) 25 MG tablet Take 1 tablet (25 mg total) by mouth 2 (two) times daily. 03/08/20  Yes McCue, Janett Billow, NP  Turmeric (QC TUMERIC COMPLEX PO) Take 1 capsule by mouth daily. 08/06/19  Yes [provider]  vitamin E 180 MG (400 UNITS) capsule Take 400 Units by mouth daily.   Yes [provider]  cephALEXin (KEFLEX) 500 MG capsule Take 1 capsule (500 mg total) by mouth 4 (four) times daily. To be taken after surgery 11/08/20   Aundra Dubin, PA-C  docusate sodium (COLACE) 100 MG capsule Take 1 capsule (100 mg total) by mouth daily as needed. Patient taking differently: Take 100 mg by mouth in the morning and at bedtime. 11/08/20 11/08/21  Aundra Dubin, PA-C  gabapentin (NEURONTIN) 100 MG capsule Take 1 capsule (100 mg total) by mouth 3 (three) times daily for 14 days. For pain Patient not taking: Reported on 11/09/2020 08/25/20 09/08/20  Ethelda Chick, PA-C  methocarbamol (ROBAXIN) 500 MG tablet Take 1 tablet (500 mg total) by mouth 2 (two) times daily as needed. To be taken after surgery 11/08/20   Aundra Dubin, PA-C  ondansetron  (ZOFRAN) 4 MG tablet Take 1 tablet (4 mg total) by mouth every 8 (eight) hours as needed for nausea or vomiting. 11/08/20   Aundra Dubin, PA-C  oxyCODONE-acetaminophen (PERCOCET) 5-325 MG tablet Take 1-2 tablets by mouth every 6 (six) hours as needed. To be taken after surgery 11/08/20   Aundra Dubin, PA-C    Current Facility-Administered Medications  Medication Dose Route Frequency Provider Last Rate Last Admin   0.9 %  sodium chloride infusion   Intravenous Continuous Leandrew Koyanagi, MD   Stopped at 11/15/20 0626   acetaminophen (TYLENOL) tablet 325-650 mg  325-650 mg Oral Q6H PRN Leandrew Koyanagi, MD       alum & mag hydroxide-simeth (MAALOX/MYLANTA) 200-200-20 MG/5ML suspension 30 mL  30 mL Oral Q4H PRN Leandrew Koyanagi, MD       aspirin chewable tablet 81 mg  81 mg Oral BID Leandrew Koyanagi, MD   81 mg at 11/17/20 0800   atorvastatin (LIPITOR) tablet 10 mg  10 mg Oral QPM Amin, Ankit Chirag, MD   10 mg at 11/16/20 1723   camphor-menthol (SARNA) lotion   Topical BID Irene Pap N, DO       Chlorhexidine Gluconate Cloth 2 % PADS 6 each  6 each Topical Q0600 Loren Racer, PA-C   6 each at 11/16/20 9485   cinacalcet (SENSIPAR) tablet 120 mg  120 mg Oral Q supper Damita Lack, MD   120 mg at 11/16/20 1722   Darbepoetin Alfa (ARANESP) injection 200 mcg  200 mcg Intravenous Q Wed-HD Loren Racer, PA-C   200 mcg at 11/16/20 1213   diphenhydrAMINE (BENADRYL) 12.5 MG/5ML elixir 25 mg  25 mg Oral Q4H PRN Leandrew Koyanagi, MD       docusate sodium (COLACE) capsule 100 mg  100 mg Oral BID Leandrew Koyanagi, MD   100 mg at 11/17/20 0800   docusate sodium (COLACE) capsule 100 mg  100 mg Oral Daily PRN Amin, Ankit Chirag, MD       HYDROmorphone (DILAUDID) injection 0.5-1 mg  0.5-1 mg Intravenous Q4H PRN Leandrew Koyanagi, MD   1 mg at 11/17/20 1057   menthol-cetylpyridinium (CEPACOL) lozenge 3 mg  1 lozenge Oral PRN Leandrew Koyanagi, MD       Or   phenol (CHLORASEPTIC) mouth spray 1 spray  1 spray  Mouth/Throat PRN Leandrew Koyanagi, MD       methocarbamol (ROBAXIN) tablet 500 mg  500 mg Oral Q6H PRN Leandrew Koyanagi, MD       Or   methocarbamol (ROBAXIN) 500 mg in dextrose 5 % 50 mL IVPB  500 mg Intravenous Q6H PRN Leandrew Koyanagi, MD       metoCLOPramide (REGLAN) tablet 5-10 mg  5-10 mg Oral Q8H PRN Leandrew Koyanagi, MD       Or   metoCLOPramide (REGLAN) injection 5-10 mg  5-10 mg Intravenous Q8H PRN Leandrew Koyanagi, MD       midodrine (PROAMATINE) tablet 10 mg  10 mg  Oral Q M,W,F Amin, Ankit Chirag, MD       ondansetron (ZOFRAN) tablet 4 mg  4 mg Oral Q6H PRN Leandrew Koyanagi, MD       Or   ondansetron [facility]) injection 4 mg  4 mg Intravenous Q6H PRN Leandrew Koyanagi, MD       oxyCODONE (Oxy IR/ROXICODONE) immediate release tablet 10-15 mg  10-15 mg Oral Q4H PRN Leandrew Koyanagi, MD   15 mg at 11/17/20 1751   oxyCODONE (Oxy IR/ROXICODONE) immediate release tablet 5-10 mg  5-10 mg Oral Q4H PRN Leandrew Koyanagi, MD   10 mg at 11/17/20 0110   oxyCODONE (OXYCONTIN) 12 hr tablet 10 mg  10 mg Oral Q12H Leandrew Koyanagi, MD   10 mg at 11/17/20 0817   pantoprazole (PROTONIX) EC tablet 40 mg  40 mg Oral Q2200 Leandrew Koyanagi, MD   40 mg at 11/16/20 2153   polyethylene glycol (MIRALAX / GLYCOLAX) packet 17 g  17 g Oral Daily Leandrew Koyanagi, MD   17 g at 11/17/20 0801   sevelamer carbonate (RENVELA) tablet 1,600 mg  1,600 mg Oral With snacks Joselyn Glassman A, RPH   1,600 mg at 11/16/20 2158   sevelamer carbonate (RENVELA) tablet 4,000 mg  4,000 mg Oral TID with meals Amin, Ankit Chirag, MD   4,000 mg at 11/17/20 0758   sorbitol 70 % solution 30 mL  30 mL Oral Daily PRN Leandrew Koyanagi, MD       topiramate (TOPAMAX) tablet 25 mg  25 mg Oral BID Leandrew Koyanagi, MD   25 mg at 11/17/20 0800    Allergies as of 10/28/2020 - Review Complete 10/20/2020  Allergen Reaction Noted   Infed [iron dextran] Other (See Comments) 06/19/2010   Oxycodone Nausea Only 06/19/2010   Vicodin [hydrocodone-acetaminophen] Other (See Comments) 06/19/2010    Whole blood Other (See Comments) 07/01/2014   Hydrocodone-acetaminophen  10/09/2019   Diclofenac Other (See Comments) 07/01/2014    Family History  Problem Relation Age of Onset   Diabetes Father    Stroke Father    Hypertension Father    Uterine cancer Mother    Lupus Sister    Stroke Sister    Hypertension Sister    Anuerysm Brother        brain   Colon cancer Neg Hx    Esophageal cancer Neg Hx    Stomach cancer Neg Hx    Pancreatic cancer Neg Hx    Liver disease Neg Hx    Colon polyps Neg Hx    Rectal cancer Neg Hx     Social History   Socioeconomic History   Marital status: Single    Spouse name: Not on file   Number of children: 2   Years of education: Not on file   Highest education level: Not on file  Occupational History   Occupation: retired    Fish farm manager: SELF EMPLOYED  Tobacco Use   Smoking status: Former    Types: Cigarettes    Quit date: 06/19/1990    Years since quitting: 30.4   Smokeless tobacco: Never   Tobacco comments:    rarely cigar  Vaping Use   Vaping Use: Never used  Substance and Sexual Activity   Alcohol use: Yes    Alcohol/week: 1.0 standard drink    Types: 1 Cans of beer per week    Comment: occasional beer   Drug use: Not Currently    Types: Marijuana  Comment: Last use several yrs ago   Sexual activity: Not Currently  Other Topics Concern   Not on file  Social History Narrative   Lives alone at home   12 years of education    2 children    Not married    Social Determinants of Health   Financial Resource Strain: Not on file  Food Insecurity: Not on file  Transportation Needs: Not on file  Physical Activity: Not on file  Stress: Not on file  Social Connections: Not on file  Intimate Partner Violence: Not on file    Review of Systems: All systems reviewed and negative except where noted in HPI.     OBJECTIVE    Physical Exam: Vital signs in last 24 hours: Temp:  [98.2 F (36.8 C)-98.6 F (37 C)] 98.2 F  (36.8 C) (10/06 0900) Pulse Rate:  [64-100] 64 (10/06 0900) Resp:  [16-17] 17 (10/05 2106) BP: (111-132)/(68-77) 132/68 (10/06 0900) SpO2:  [98 %-100 %] 98 % (10/06 0900) Weight:  [86.7 kg] 86.7 kg (10/05 1200) Last BM Date: 11/16/20 General:   Alert  male in NAD in bedside chair Psych:  Pleasant, cooperative. Normal mood and affect. Eyes:  Pupils equal, sclera clear, no icterus.   Conjunctiva pink. Ears:  Normal auditory acuity. Nose:  No deformity, discharge,  or lesions. Neck:  Supple; no masses Lungs:  Clear throughout to auscultation.   No wheezes, crackles, or rhonchi.  Heart:  Regular rate and rhythm;  no lower extremity edema Abdomen:  Soft, non-distended, nontender, BS active, no palp mass ( limited exam in chair)   Rectal:  Deferred  Msk:  Symmetrical without gross deformities. Left hip wound vac in place. No bruising / swelling at surgical site Neurologic:  Alert and  oriented x4;  grossly normal neurologically. Skin:  Intact without significant lesions or rashes.   Scheduled inpatient medications  aspirin  81 mg Oral BID   atorvastatin  10 mg Oral QPM   camphor-menthol   Topical BID   Chlorhexidine Gluconate Cloth  6 each Topical Q0600   cinacalcet  120 mg Oral Q supper   darbepoetin (ARANESP) injection - DIALYSIS  200 mcg Intravenous Q Wed-HD   docusate sodium  100 mg Oral BID   midodrine  10 mg Oral Q M,W,F   oxyCODONE  10 mg Oral Q12H   pantoprazole  40 mg Oral Q2200   polyethylene glycol  17 g Oral Daily   sevelamer carbonate  1,600 mg Oral With snacks   sevelamer carbonate  4,000 mg Oral TID with meals   topiramate  25 mg Oral BID      Intake/Output from previous day: 10/05 0701 - 10/06 0700 In: 615 [P.O.:300; Blood:315] Out: 1500  Intake/Output this shift: No intake/output data recorded.   Lab Results: Recent Labs    11/15/20 0033 11/16/20 0850 11/16/20 2118  WBC 12.4* 9.7 10.1  HGB 7.0* 5.4* 7.4*  HCT 22.6* 17.3* 23.3*  PLT 260 217 259    BMET Recent Labs    11/14/20 1431 11/15/20 0033 11/16/20 0850  NA 137 135 132*  K 3.8 4.8 4.5  CL 98 97* 94*  CO2  --  25 22  GLUCOSE 70 103* 88  BUN 29* 34* 61*  CREATININE 7.50* 7.64* 9.55*  CALCIUM  --  8.0* 7.7*   LFT Recent Labs    11/16/20 0850  ALBUMIN 2.2*   PT/INR No results for input(s): LABPROT, INR in the last 72 hours. Hepatitis Panel  Recent Labs    11/16/20 0705  HEPBSAG NON REACTIVE     . CBC Latest Ref Rng & Units 11/16/2020 11/16/2020 11/15/2020  WBC 4.0 - 10.5 K/uL 10.1 9.7 12.4(H)  Hemoglobin 13.0 - 17.0 g/dL 7.4(L) 5.4(LL) 7.0(L)  Hematocrit 39.0 - 52.0 % 23.3(L) 17.3(L) 22.6(L)  Platelets 150 - 400 K/uL 259 217 260    . CMP Latest Ref Rng & Units 11/16/2020 11/15/2020 11/14/2020  Glucose 70 - 99 mg/dL 88 103(H) 70  BUN 8 - 23 mg/dL 61(H) 34(H) 29(H)  Creatinine 0.61 - 1.24 mg/dL 9.55(H) 7.64(H) 7.50(H)  Sodium 135 - 145 mmol/L 132(L) 135 137  Potassium 3.5 - 5.1 mmol/L 4.5 4.8 3.8  Chloride 98 - 111 mmol/L 94(L) 97(L) 98  CO2 22 - 32 mmol/L 22 25 -  Calcium 8.9 - 10.3 mg/dL 7.7(L) 8.0(L) -  Total Protein 6.5 - 8.1 g/dL - - -  Total Bilirubin 0.3 - 1.2 mg/dL - - -  Alkaline Phos 38 - 126 U/L - - -  AST 15 - 41 U/L - - -  ALT 0 - 44 U/L - - -   Studies/Results: No results found.  Principal Problem:   Primary osteoarthritis of left hip Active Problems:   GERD (gastroesophageal reflux disease)   History of lacunar cerebrovascular accident (CVA)   Anemia of renal disease   HLD (hyperlipidemia)   Essential hypertension   ESRD on hemodialysis ([place])   Obstructive sleep apnea   Type 2 diabetes mellitus with other specified complication ([place])   Status post total replacement of left hip   Left hip pain    Carl Savoy, NP-C @  11/17/2020, 11:59 AM   I have taken a history, reviewed the chart and examined the patient. I performed a substantive portion of this encounter, including complete performance of at least one of the key  components, in conjunction with the APP. I agree with the APP's note, impression and recommendations  60 year old male with ESRD, OA and history of PUD admitted for L THA on Oct 3rd, has experienced serial decline in Hgb over the past 2 days from a baseline of 10 to 7 to 5 today, with dark bowel movements with positive FOBT.  BUN progressively elevated, but significance limited by patient's dialysis status.   No other GI symptoms such as abdominal pain, nausea/vomiting, diarrhea.  He had been taking 'lots' of OTC pain meds in the days/weeks leading up to his surgery.  He has a history of bleeding gastric ulcers in 2021 secondary to NSAIDs. He has been hemodynamically stable.  Hgb responded to 1 unit pRBCs yesterday. Presentation most consistent with a bleeding peptic ulcer.  Plan for EGD tomorrow.  Will touch base with ortho to make sure left lateral decubitus position is okay for his procedure.  Acute blood loss anemia/suspected UGIB -- Clears today.  NPO after midnight -  Protonix 40 mg BID -- EGD tomorrow after dialysis. - High risk for DVT, but also bleeding; lovenox may pose less bleeding risk than ASA from PUD  Draeden Kellman E. Candis Schatz, MD [facility] Gastroenterology

## 2020-11-18 ENCOUNTER — Inpatient Hospital Stay (HOSPITAL_COMMUNITY): Payer: Medicare Other | Admitting: Anesthesiology

## 2020-11-18 ENCOUNTER — Encounter (HOSPITAL_COMMUNITY)
Admission: AD | Disposition: A | Discharge status: Home / Self Care | Payer: Self-pay | Source: Ambulatory Visit | Attending: Internal Medicine

## 2020-11-18 DIAGNOSIS — K3189 Other diseases of stomach and duodenum: Secondary | ICD-10-CM | POA: Diagnosis not present

## 2020-11-18 DIAGNOSIS — K922 Gastrointestinal hemorrhage, unspecified: Secondary | ICD-10-CM

## 2020-11-18 DIAGNOSIS — K2289 Other specified disease of esophagus: Secondary | ICD-10-CM

## 2020-11-18 DIAGNOSIS — K319 Disease of stomach and duodenum, unspecified: Secondary | ICD-10-CM

## 2020-11-18 DIAGNOSIS — K254 Chronic or unspecified gastric ulcer with hemorrhage: Secondary | ICD-10-CM | POA: Diagnosis not present

## 2020-11-18 DIAGNOSIS — K2960 Other gastritis without bleeding: Secondary | ICD-10-CM

## 2020-11-18 DIAGNOSIS — M1612 Unilateral primary osteoarthritis, left hip: Secondary | ICD-10-CM | POA: Diagnosis not present

## 2020-11-18 HISTORY — PX: ESOPHAGOGASTRODUODENOSCOPY: SHX5428

## 2020-11-18 HISTORY — PX: HOT HEMOSTASIS: SHX5433

## 2020-11-18 HISTORY — PX: BIOPSY: SHX5522

## 2020-11-18 LAB — CBC
HCT: 20.9 % — ABNORMAL LOW (ref 39.0–52.0)
Hemoglobin: 6.7 g/dL — CL (ref 13.0–17.0)
MCH: 27.9 pg (ref 26.0–34.0)
MCHC: 32.1 g/dL (ref 30.0–36.0)
MCV: 87.1 fL (ref 80.0–100.0)
Platelets: 233 10*3/uL (ref 150–400)
RBC: 2.4 MIL/uL — ABNORMAL LOW (ref 4.22–5.81)
RDW: 16.2 % — ABNORMAL HIGH (ref 11.5–15.5)
WBC: 6.8 10*3/uL (ref 4.0–10.5)
nRBC: 0 % (ref 0.0–0.2)

## 2020-11-18 LAB — TYPE AND SCREEN
ABO/RH(D): O POS
Antibody Screen: NEGATIVE
Unit division: 0
Unit division: 0

## 2020-11-18 LAB — RENAL FUNCTION PANEL
Albumin: 2.1 g/dL — ABNORMAL LOW (ref 3.5–5.0)
Anion gap: 14 (ref 5–15)
BUN: 43 mg/dL — ABNORMAL HIGH (ref 8–23)
CO2: 24 mmol/L (ref 22–32)
Calcium: 8.2 mg/dL — ABNORMAL LOW (ref 8.9–10.3)
Chloride: 95 mmol/L — ABNORMAL LOW (ref 98–111)
Creatinine, Ser: 9.07 mg/dL — ABNORMAL HIGH (ref 0.61–1.24)
GFR, Estimated: 6 mL/min — ABNORMAL LOW (ref >60)
Glucose, Bld: 80 mg/dL (ref 70–99)
Phosphorus: 4.2 mg/dL (ref 2.5–4.6)
Potassium: 4.4 mmol/L (ref 3.5–5.1)
Sodium: 133 mmol/L — ABNORMAL LOW (ref 135–145)

## 2020-11-18 LAB — BPAM RBC
Blood Product Expiration Date: 202210282359
Blood Product Expiration Date: 202210282359
ISSUE DATE / TIME: 202209260949
ISSUE DATE / TIME: 202210051026
Unit Type and Rh: 5100
Unit Type and Rh: 5100

## 2020-11-18 SURGERY — EGD (ESOPHAGOGASTRODUODENOSCOPY)
Anesthesia: General

## 2020-11-18 MED ORDER — GLUCAGON HCL RDNA (DIAGNOSTIC) 1 MG IJ SOLR
INTRAMUSCULAR | Status: DC | PRN
  Administered 2020-11-18: .25 mg via INTRAVENOUS

## 2020-11-18 MED ORDER — PANTOPRAZOLE SODIUM 40 MG PO TBEC
40.0000 mg | DELAYED_RELEASE_TABLET | Freq: Two times a day (BID) | ORAL | Status: DC
  Administered 2020-11-18 – 2020-11-25 (×12): 40 mg via ORAL
  Filled 2020-11-18 (×12): qty 1

## 2020-11-18 MED ORDER — GLUCAGON HCL RDNA (DIAGNOSTIC) 1 MG IJ SOLR
INTRAMUSCULAR | Status: AC
  Filled 2020-11-18: qty 1

## 2020-11-18 MED ORDER — PROPOFOL 10 MG/ML IV BOLUS
INTRAVENOUS | Status: DC | PRN
  Administered 2020-11-18: 10 mg via INTRAVENOUS
  Administered 2020-11-18: 100 mg via INTRAVENOUS
  Administered 2020-11-18: 20 mg via INTRAVENOUS

## 2020-11-18 MED ORDER — PROSOURCE PLUS PO LIQD: 30.0000 mL | Freq: Two times a day (BID) | ORAL | 0 refills | Status: AC

## 2020-11-18 MED ORDER — PROSOURCE PLUS PO LIQD
30.0000 mL | Freq: Two times a day (BID) | ORAL | Status: DC
  Administered 2020-11-18 – 2020-11-25 (×12): 30 mL via ORAL
  Filled 2020-11-18 (×12): qty 30

## 2020-11-18 MED ORDER — PROPOFOL 500 MG/50ML IV EMUL
INTRAVENOUS | Status: DC | PRN
  Administered 2020-11-18: 150 ug/kg/min via INTRAVENOUS

## 2020-11-18 MED ORDER — SODIUM CHLORIDE 0.9 % IV SOLN: INTRAVENOUS | Status: DC

## 2020-11-18 MED ORDER — CAMPHOR-MENTHOL 0.5-0.5 % EX LOTN: TOPICAL_LOTION | Freq: Two times a day (BID) | CUTANEOUS | 0 refills | Status: DC

## 2020-11-18 MED ORDER — SUCRALFATE 1 GM/10ML PO SUSP
1.0000 g | Freq: Three times a day (TID) | ORAL | Status: DC
  Administered 2020-11-18 – 2020-11-25 (×24): 1 g via ORAL
  Filled 2020-11-18 (×32): qty 10

## 2020-11-18 MED ORDER — PHENYLEPHRINE 40 MCG/ML (10ML) SYRINGE FOR IV PUSH (FOR BLOOD PRESSURE SUPPORT)
PREFILLED_SYRINGE | INTRAVENOUS | Status: DC | PRN
  Administered 2020-11-18: 120 ug via INTRAVENOUS

## 2020-11-18 MED ORDER — SODIUM CHLORIDE 0.9 % IV SOLN: INTRAVENOUS | Status: DC | PRN

## 2020-11-18 NOTE — Progress Notes (Signed)
Date and time results received: 11/18/20 0256 (use smartphrase ".now" to insert current time)  Test: Hemoglobin Critical Value: 6.7  Name of Provider Notified: X Blout, np  Orders Received? Or Actions Taken?: no new orders. Pt refuses blood transfusion. Hemoglobin 7.3 yesterday

## 2020-11-18 NOTE — Progress Notes (Signed)
OT Cancellation Note  Patient Details Name: Carl Hyland Sr. MRN: [id number] DOB: July 31, 1960   Cancelled Treatment:    Reason Eval/Treat Not Completed: Patient at procedure or test/ unavailable, HD. Will follow up as able.  Helane Gunther, OT/L  Acute [facility] [place] 11/18/2020, 12:21 PM

## 2020-11-18 NOTE — Transfer of Care (Signed)
Immediate Anesthesia Transfer of Care Note  Patient: Carl Emig Sr.  Procedure(s) Performed: ESOPHAGOGASTRODUODENOSCOPY (EGD) HOT HEMOSTASIS (ARGON PLASMA COAGULATION/BICAP) BIOPSY  Patient Location: Endoscopy Unit  Anesthesia Type:MAC  Level of Consciousness: drowsy and patient cooperative  Airway & Oxygen Therapy: Patient Spontanous Breathing  Post-op Assessment: Report given to RN and Post -op Vital signs reviewed and stable  Post vital signs: Reviewed and stable  Last Vitals:  Vitals Value Taken Time  BP 100/62 11/18/20 1505  Temp    Pulse 86 11/18/20 1507  Resp 16 11/18/20 1507  SpO2 100 % 11/18/20 1507  Vitals shown include unvalidated device data.  Last Pain:  Vitals:   11/18/20 1300  TempSrc:   PainSc: 0-No pain      Patients Stated Pain Goal: 1 (83/72/90 2111)  Complications: No notable events documented.

## 2020-11-18 NOTE — Interval H&P Note (Signed)
History and Physical Interval Note:  No acute events overnight.  No BM.  Hgb decreased to 6.7 from 7.3 this morning.  Patient underwent dialysis this morning.  11/18/2020 2:09 PM  [name]  has presented today for surgery, with the diagnosis of Upper GI bleed.  The various methods of treatment have been discussed with the patient and family. After consideration of risks, benefits and other options for treatment, the patient has consented to  Procedure(s): ESOPHAGOGASTRODUODENOSCOPY (EGD) (N/A) as a surgical intervention.  The patient's history has been reviewed, patient examined, no change in status, stable for surgery.  I have reviewed the patient's chart and labs.  Questions were answered to the patient's satisfaction.     Daryel November

## 2020-11-18 NOTE — Progress Notes (Signed)
  [facility] Progress Note   Subjective:  Seen on HD - 1.5L UFG and tolerating. No CP or dyspnea. For EGD today after dialysis.  Objective Vitals:   11/18/20 0830 11/18/20 0900 11/18/20 0930 11/18/20 1000  BP: 124/80 128/72 117/75 126/74  Pulse: 81 81 83 92  Resp: 10     Temp:      TempSrc:      SpO2:      Weight:      Height:       Physical Exam General: Well appearing man, NAD. Room air. Heart: RRR; no murmur Lungs: CTA anteriorly Abdomen: soft, non-tender Extremities: No LE edema; wound vac to anterior L hip  Dialysis Access: R forearm AVF + bruit  Additional Objective Labs: Basic Metabolic Panel: Recent Labs  Lab 11/15/20 0033 11/16/20 0850 11/18/20 0800  NA 135 132* 133*  K 4.8 4.5 4.4  CL 97* 94* 95*  CO2 25 22 24   GLUCOSE 103* 88 80  BUN 34* 61* 43*  CREATININE 7.64* 9.55* 9.07*  CALCIUM 8.0* 7.7* 8.2*  PHOS  --  4.6 4.2   Liver Function Tests: Recent Labs  Lab 11/16/20 0850 11/18/20 0800  ALBUMIN 2.2* 2.1*   CBC: Recent Labs  Lab 11/15/20 0033 11/16/20 0850 11/16/20 2118 11/17/20 1345 11/18/20 0211  WBC 12.4* 9.7 10.1 9.8 6.8  NEUTROABS 11.3*  --   --   --   --   HGB 7.0* 5.4* 7.4* 7.3* 6.7*  HCT 22.6* 17.3* 23.3* 22.8* 20.9*  MCV 88.3 86.9 87.3 88.4 87.1  PLT 260 217 259 318 233    Medications:  sodium chloride Stopped (11/15/20 9798)   methocarbamol (ROBAXIN) IV      sodium chloride   Intravenous Once   aspirin  81 mg Oral BID   atorvastatin  10 mg Oral QPM   camphor-menthol   Topical BID   Chlorhexidine Gluconate Cloth  6 each Topical Q0600   cinacalcet  120 mg Oral Q supper   darbepoetin (ARANESP) injection - DIALYSIS  200 mcg Intravenous Q Wed-HD   docusate sodium  100 mg Oral BID   midodrine  10 mg Oral Q M,W,F   oxyCODONE  10 mg Oral Q12H   pantoprazole (PROTONIX) IV  40 mg Intravenous Q12H   polyethylene glycol  17 g Oral Daily   sevelamer carbonate  1,600 mg Oral With snacks   sevelamer carbonate   4,000 mg Oral TID with meals   topiramate  25 mg Oral BID    Dialysis Orders: MWF at [facility] 4hr, 400/500, EDW 88kg, 2K/2.5Ca, AVF, heparin 2000 bolus - Mircera 144mcg IV q 2 weeks (last 9/21 for Hgb 7.7)   Assessment/Plan:  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  ESRD:  Continue HD per MWF schedule - HD now.  BP/volume: BP controlled, no edema on exam. Uses midodrine for HD.  Anemia (ESRD + GIB): Hgb down to 5.4, agreed for 1U PRBCs, given on 10/5. FOBT positive. S/p Aranesp 245mcg on 10/4. Tsat low - allergy to IV iron listed in chart. Hgb trending down again. GI consulted, for EGD today.  Metabolic bone disease: Ca/Phos good - continue home binders/sensipar. Nutrition: Alb low, adding supplements.  T2DM   Hx CVA  Veneta Penton, Hershal Coria 11/18/2020, 10:21 AM  [employer]

## 2020-11-18 NOTE — Progress Notes (Signed)
Discussed potential for discharge today with Pt and he was very resistant. Voiced concerns about going home alone, hemoglobin continuing to drop and new diagnosis of bleeding ulcer. Alerted physician and discharge order was removed. Will revisit tomorrow.

## 2020-11-18 NOTE — Anesthesia Preprocedure Evaluation (Addendum)
Anesthesia Evaluation  Patient identified by MRN, date of birth, ID band Patient awake    Reviewed: Allergy & Precautions, NPO status , Patient's Chart, lab work & pertinent test results  History of Anesthesia Complications Negative for: history of anesthetic complications  Airway Mallampati: II  TM Distance: >3 FB Neck ROM: Full    Dental   Pulmonary sleep apnea , former smoker,    Pulmonary exam normal        Cardiovascular hypertension, + Peripheral Vascular Disease  Normal cardiovascular exam  Nuclear stress 10/2020: low risk, EF 58%   Neuro/Psych Anxiety Depression TIACVA, No Residual Symptoms    GI/Hepatic Neg liver ROS, PUD, GERD  Controlled and Medicated,UGIB   Endo/Other  diabetes, Type 2  Renal/GU Dialysis and ESRFRenal disease (HD M/W/F)  negative genitourinary   Musculoskeletal  (+) Arthritis ,   Abdominal   Peds  Hematology  (+) anemia , JEHOVAH'S WITNESSHgb 6.7   Anesthesia Other Findings Day of surgery medications reviewed with patient.  Reproductive/Obstetrics negative OB ROS                            Anesthesia Physical  Anesthesia Plan  ASA: 4  Anesthesia Plan: General   Post-op Pain Management:    Induction: Intravenous  PONV Risk Score and Plan: 2 and Treatment may vary due to age or medical condition, Ondansetron, Dexamethasone and Midazolam  Airway Management Planned: Oral ETT  Additional Equipment: None  Intra-op Plan:   Post-operative Plan: Extubation in OR  Informed Consent: I have reviewed the patients History and Physical, chart, labs and discussed the procedure including the risks, benefits and alternatives for the proposed anesthesia with the patient or authorized representative who has indicated his/her understanding and acceptance.       Plan Discussed with:   Anesthesia Plan Comments: (Does not wish to receive blood products unless  absolutely necessary to prevent permanent disability or death, but consents to receiving blood products in that scenario.)       Anesthesia Quick Evaluation

## 2020-11-18 NOTE — Progress Notes (Signed)
Discharge postponed.  Patient is not comfortable going home today.  We will monitor overnight and repeat H&H in the morning.  Patient has declined blood transfusion this morning.

## 2020-11-18 NOTE — Care Management (Signed)
Dr Erlinda Hong office arranged [place] for [place]. Ordered walker and 3 in1 with [place]

## 2020-11-18 NOTE — Discharge Summary (Signed)
Discharge Summary  Carl Laforest Sr. ZJI:[id number] DOB: 18-Jan-1961  PCP: [name], MD  Admit date: 11/14/2020 Discharge date: 11/18/2020  Time spent: 35 minutes.    Recommendations for Outpatient Follow-up:   Follow-up with orthopedic surgery Follow-up with your primary care provider Keep your hemodialysis appointments. Take your medications as prescribed Continue PT OT with assistance and fall precautions.  Discharge Diagnoses:  Active Hospital Problems   Diagnosis Date Noted   Primary osteoarthritis of left hip 10/07/2020   Left hip pain 11/16/2020   Type 2 diabetes mellitus with other specified complication ([place]) 17/51/0258   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/14/2020   ESRD on hemodialysis ([place]) 03/08/2020   Essential hypertension    Obstructive sleep apnea 02/08/2015   HLD (hyperlipidemia)    History of lacunar cerebrovascular accident (CVA) 05/23/2014   Anemia of renal disease 05/23/2014   GERD (gastroesophageal reflux disease) 12/09/2012    Resolved Hospital Problems  No resolved problems to display.    Discharge Condition: Stable  Diet recommendation: Resume previous diet  Vitals:   11/18/20 1235 11/18/20 1254  BP: 132/79 135/72  Pulse: 92 93  Resp: 17 10  Temp: 98.9 F (37.2 C) 98.1 F (36.7 C)  SpO2: 100% 100%    History of present illness:  60-year with history of ESRD on HD Monday Wednesday Friday, HTN, DM2, TIA, lacunar CVA, GERD, anemia of chronic disease, HLD, OSA, osteoarthritis of the left hip admitted for hip replacement.  Medical team consulted for medication management. Care transferred to [facility] on 11/16/2020.  Hospital course complicated by symptomatic anemia with hemoglobin as low as 5.4 K with positive FOBT.  He was transfused 1 unit PRBC at hemodialysis on 11/16/2020, hemoglobin corrected to 7.3.  Seen by GI with plan for EGD on 11/18/2020.  He is on PPI twice daily.   11/17/2020: Seen at his bedside, his hemoglobin dropped  this morning, 6.7 K.  Patient refused blood transfusion.  He has no complaints.  Hospital Course:  Principal Problem:   Primary osteoarthritis of left hip Active Problems:   GERD (gastroesophageal reflux disease)   History of lacunar cerebrovascular accident (CVA)   Anemia of renal disease   HLD (hyperlipidemia)   Essential hypertension   ESRD on hemodialysis ([place])   Obstructive sleep apnea   Type 2 diabetes mellitus with other specified complication ([facility])   Status post total replacement of left hip   Left hip pain  Post left total hip arthroplasty by orthopedic surgery Dr. Erlinda Hong on 11/15/2020 POD #3. Primary osteoarthritis of left hip Pain management and postop DVT prophylaxis per orthopedic surgery. PT recommended home health PT. Continue fall precautions.   Acute blood loss anemia, positive FOBT, iron deficiency anemia, anemia of chronic disease. Hemoglobin dropped to 5.4 K, transfused 1 unit PRBC at hemodialysis on 11/16/2020 Positive FOBT. Evidence of iron deficiency on iron studies. Transfuse additional 1 unit PRBC if patient is agreeable, patient declines additional blood transfusion. Seen by GI, plan for EGD 11/18/2020 after hemodialysis.   Acute left ankle pain -Some soft tissue swelling.  No evidence of infection.  Uric acid levels are normal.  Continue to monitor, pain control.  Orthopedic follow-up Pain management    ESRD on hemodialysis ([place]) -HD MWF.  Nephrology following. Completed HD on 11/16/2020 HD 11/18/2020     GERD (gastroesophageal reflux disease) IV Protonix 40 mg twice daily due to concern for upper GI bleed. EGD 11/18/2020.     History of lacunar cerebrovascular accident (CVA) On  aspirin 81 mg twice daily for DVT prophylaxis, will switch to subcu Lovenox if okay with surgery. Continue home Lipitor PT OT assessment, recommendation for home health PT     HLD (hyperlipidemia) - Continue home Lipitor     Essential hypertension Continue home midodrine  MWF     Obstructive sleep apnea -Not on home CPAP.   Dry skin from ESRD Possible uremic pruritus As needed p.o. Benadryl and Sarna lotion       Code Status: Full code     Code Status: Full code   Consultants: Nephrology Orthopedic surgery GI.   Procedures: HD on 11/16/2020. Plan for HD on 11/18/2020   Antimicrobials: None.   DVT prophylaxis: Aspirin 81 mg twice daily.    Discharge Exam: BP 135/72   Pulse 93   Temp 98.1 F (36.7 C) (Temporal)   Resp 10   Ht 6\' 1"  (1.854 m)   Wt 86.7 kg   SpO2 100%   BMI 25.22 kg/m  General: 60 y.o. year-old male well developed well nourished in no acute distress.  Alert and oriented x3. Cardiovascular: Regular rate and rhythm with no rubs or gallops.  No thyromegaly or JVD noted.   Respiratory: Clear to auscultation with no wheezes or rales. Good inspiratory effort. Abdomen: Soft nontender nondistended with normal bowel sounds x4 quadrants. Musculoskeletal: No lower extremity edema. Skin: No ulcerative lesions noted or rashes, Psychiatry: Mood is appropriate for condition and setting  Discharge Instructions You were cared for by a hospitalist during your hospital stay. If you have any questions about your discharge medications or the care you received while you were in the hospital after you are discharged, you can call the unit and asked to speak with the hospitalist on call if the hospitalist that took care of you is not available. Once you are discharged, your primary care physician will handle any further medical issues. Please note that NO REFILLS for any discharge medications will be authorized once you are discharged, as it is imperative that you return to your primary care physician (or establish a relationship with a primary care physician if you do not have one) for your aftercare needs so that they can reassess your need for medications and monitor your lab values.   Allergies as of 11/18/2020       Reactions   Infed  [iron Dextran] Other (See Comments)   Decreased BP    Oxycodone Nausea Only   Vicodin [hydrocodone-acetaminophen] Other (See Comments)   Decreased BP   Hydrocodone-acetaminophen    Other reaction(s): lower BP   Diclofenac Other (See Comments)   Unknown        Medication List     STOP taking these medications    gabapentin 100 MG capsule Commonly known as: NEURONTIN   ibuprofen 800 MG tablet Commonly known as: ADVIL   QC TUMERIC COMPLEX PO       TAKE these medications    (feeding supplement) PROSource Plus liquid Take 30 mLs by mouth 2 (two) times daily between meals for 7 days.   aspirin EC 81 MG tablet Take 1 tablet (81 mg total) by mouth 2 (two) times daily. To be taken after surgery What changed:  when to take this additional instructions   atorvastatin 10 MG tablet Commonly known as: LIPITOR Take 10 mg by mouth every evening.   camphor-menthol lotion Commonly known as: SARNA Apply topically 2 (two) times daily.   cephALEXin 500 MG capsule Commonly known as: Keflex Take 1 capsule (  500 mg total) by mouth 4 (four) times daily. To be taken after surgery   cinacalcet 60 MG tablet Commonly known as: SENSIPAR Take 120 mg by mouth daily with supper.   cromolyn 4 % ophthalmic solution Commonly known as: OPTICROM Place 1 drop into both eyes daily as needed (redness).   DIALYVITE PO Take 1 tablet by mouth daily.   docusate sodium 100 MG capsule Commonly known as: Colace Take 1 capsule (100 mg total) by mouth daily as needed. What changed: when to take this   Lokelma 10 g Pack packet Generic drug: sodium zirconium cyclosilicate Take 1 packet by mouth daily.   methocarbamol 500 MG tablet Commonly known as: Robaxin Take 1 tablet (500 mg total) by mouth 2 (two) times daily as needed. To be taken after surgery   midodrine 10 MG tablet Commonly known as: PROAMATINE Take 10 mg by mouth See admin instructions. Take 10 mg by mouth three times a week as  directed, bring to dialysis. May need an additional tablet during treatment.   omeprazole 20 MG capsule Commonly known as: PRILOSEC Take 40 mg by mouth in the morning and at bedtime.   ondansetron 4 MG tablet Commonly known as: Zofran Take 1 tablet (4 mg total) by mouth every 8 (eight) hours as needed for nausea or vomiting.   oxyCODONE-acetaminophen 5-325 MG tablet Commonly known as: Percocet Take 1-2 tablets by mouth every 6 (six) hours as needed. To be taken after surgery   sevelamer carbonate 800 MG tablet Commonly known as: RENVELA Take 1,600-4,000 mg by mouth See admin instructions. Take 4000 mg by mouth three times daily with meals and 1600-2400 mg with snacks   topiramate 25 MG tablet Commonly known as: TOPAMAX Take 1 tablet (25 mg total) by mouth 2 (two) times daily.   vitamin E 180 MG (400 UNITS) capsule Take 400 Units by mouth daily.               Durable Medical Equipment  (From admission, onward)           Start     Ordered   11/14/20 2028  DME Walker rolling  Once       Question:  Patient needs a walker to treat with the following condition  Answer:  History of hip replacement   11/14/20 2027   11/14/20 2028  DME 3 n 1  Once        11/14/20 2027   11/14/20 2028  DME Bedside commode  Once       Question:  Patient needs a bedside commode to treat with the following condition  Answer:  History of hip replacement   11/14/20 2027           Allergies  Allergen Reactions   Infed [Iron Dextran] Other (See Comments)    Decreased BP    Oxycodone Nausea Only   Vicodin [Hydrocodone-Acetaminophen] Other (See Comments)    Decreased BP   Hydrocodone-Acetaminophen     Other reaction(s): lower BP   Diclofenac Other (See Comments)    Unknown    Follow-up Information     Leandrew Koyanagi, MD. Schedule an appointment as soon as possible for a visit in 1 week(s).   Specialty: Orthopedic Surgery Contact information: [place]  [address] [phone]         [name], MD. Call today.   Specialty: Family Medicine Why: Please call for a posthospital follow-up appointment. Contact information: [place]  [address] [place] [address] [phone]         Buford Dresser, MD .   Specialty: Cardiology Contact information: [address] [place]  [address] [phone]                  The results of significant diagnostics from this hospitalization (including imaging, microbiology, ancillary and laboratory) are listed below for reference.    Significant Diagnostic Studies: DG Pelvis Portable  Result Date: 11/14/2020 CLINICAL DATA:  Status post left arthroplasty EXAM: PORTABLE PELVIS 1 VIEWS COMPARISON:  Films from earlier in the same day. FINDINGS: Pelvic ring is intact. Left hip replacement is now seen in satisfactory position. No acute bony or soft tissue abnormality is noted. Vascular calcifications are seen. IMPRESSION: Status post left hip replacement Electronically Signed   By: Inez Catalina M.D.   On: 11/14/2020 19:04   DG Ankle Left Port  Result Date: 11/14/2020 CLINICAL DATA:  Ankle pain EXAM: PORTABLE LEFT ANKLE - 2 VIEW COMPARISON:  None. FINDINGS: Soft tissue swelling is present. Age indeterminate avulsions at the tips of the medial and lateral malleoli. Ankle mortise is symmetric. Vascular calcifications. IMPRESSION: Soft tissue swelling. Age indeterminate avulsions off the tips of the medial and lateral malleoli Electronically Signed   By: Donavan Foil M.D.   On: 11/14/2020 19:42   DG C-Arm 1-60 Min-No Report  Result Date: 11/14/2020 Fluoroscopy was utilized by the requesting physician.  No radiographic interpretation.   DG C-Arm 1-60 Min-No Report  Result Date: 11/14/2020 Fluoroscopy was utilized by the requesting physician.  No radiographic interpretation.   MYOCARDIAL PERFUSION IMAGING  Result Date: 10/20/2020   The study is  normal. The study is low risk.   No ST deviation was noted.   LV perfusion is normal. There is no evidence of ischemia. There is no evidence of infarction.   Left ventricular function is normal. Nuclear stress EF: 58 %. The left ventricular ejection fraction is normal (55-65%).   Prior study not available for comparison. Reduced counts were noted in the inferior segments with normal wall motion consistent with diaphragm attenuation.  No evidence of ischemia or prior infarction.  This is a low risk study.   DG HIP OPERATIVE UNILAT WITH PELVIS LEFT  Result Date: 11/14/2020 CLINICAL DATA:  Left hip replacement EXAM: OPERATIVE LEFT HIP WITH PELVIS COMPARISON:  05/19/2020 FLUOROSCOPY TIME:  Radiation Exposure Index (as provided by the fluoroscopic device): 1.98 mGy If the device does not provide the exposure index: Fluoroscopy Time:  18 seconds Number of Acquired Images:  4 FINDINGS: Initial images demonstrate significant destruction of the left femoral head progressed in the interval from the prior exam. Subsequent images demonstrate removal of the femoral neck and femoral head with placement of left hip prosthesis. No acute bony or soft tissue abnormality is seen. Lucent lesion is again noted in the region of the right femoral neck and head. IMPRESSION: Left hip arthroplasty Electronically Signed   By: Inez Catalina M.D.   On: 11/14/2020 19:05    Microbiology: Recent Results (from the past 240 hour(s))  Surgical pcr screen     Status: None   Collection Time: 11/10/20  8:59 AM   Specimen: Nasal Mucosa; Nasal Swab  Result Value Ref Range Status   MRSA, PCR NEGATIVE NEGATIVE Final   Staphylococcus aureus NEGATIVE NEGATIVE Final    Comment: (NOTE) The Xpert SA Assay (FDA approved for NASAL specimens in patients 59 years of age and older), is one component of a  comprehensive surveillance program. It is not intended to diagnose infection nor to guide or monitor treatment. Performed at [place], [place] [address], [place], [place] [address]   SARS CORONAVIRUS 2 (TAT 6-24 HRS) Nasopharyngeal Nasopharyngeal Swab     Status: None   Collection Time: 11/10/20  9:00 AM   Specimen: Nasopharyngeal Swab  Result Value Ref Range Status   SARS Coronavirus 2 NEGATIVE NEGATIVE Final    Comment: (NOTE) SARS-CoV-2 target nucleic acids are NOT DETECTED.  The SARS-CoV-2 RNA is generally detectable in upper and lower respiratory specimens during the acute phase of infection. Negative results do not preclude SARS-CoV-2 infection, do not rule out co-infections with other pathogens, and should not be used as the sole basis for treatment or other patient management decisions. Negative results must be combined with clinical observations, patient history, and epidemiological information. The expected result is Negative.  Fact Sheet for Patients: SugarRoll.be  Fact Sheet for Healthcare Providers: https://www.woods-mathews.com/  This test is not yet approved or cleared by the [place] FDA and  has been authorized for detection and/or diagnosis of SARS-CoV-2 by FDA under an Emergency Use Authorization (EUA). This EUA will remain  in effect (meaning this test can be used) for the duration of the COVID-19 declaration under Se ction 564(b)(1) of the Act, 21 U.S.C. section 360bbb-3(b)(1), unless the authorization is terminated or revoked sooner.  Performed at [facility], [place] [address], [place], [place] [address]      Labs: Basic Metabolic Panel: Recent Labs  Lab 11/14/20 1431 11/15/20 0033 11/16/20 0850 11/18/20 0800  NA 137 135 132* 133*  K 3.8 4.8 4.5 4.4  CL 98 97* 94* 95*  CO2  --  25 22 24   GLUCOSE 70 103* 88 80  BUN 29* 34* 61* 43*  CREATININE 7.50* 7.64* 9.55* 9.07*  CALCIUM  --  8.0* 7.7* 8.2*  PHOS  --   --  4.6 4.2   Liver Function Tests: Recent Labs  Lab 11/16/20 0850 11/18/20 0800  ALBUMIN 2.2* 2.1*   No results  for input(s): LIPASE, AMYLASE in the last 168 hours. No results for input(s): AMMONIA in the last 168 hours. CBC: Recent Labs  Lab 11/15/20 0033 11/16/20 0850 11/16/20 2118 11/17/20 1345 11/18/20 0211  WBC 12.4* 9.7 10.1 9.8 6.8  NEUTROABS 11.3*  --   --   --   --   HGB 7.0* 5.4* 7.4* 7.3* 6.7*  HCT 22.6* 17.3* 23.3* 22.8* 20.9*  MCV 88.3 86.9 87.3 88.4 87.1  PLT 260 217 259 318 233   Cardiac Enzymes: No results for input(s): CKTOTAL, CKMB, CKMBINDEX, TROPONINI in the last 168 hours. BNP: BNP (last 3 results) No results for input(s): BNP in the last 8760 hours.  ProBNP (last 3 results) No results for input(s): PROBNP in the last 8760 hours.  CBG: No results for input(s): GLUCAP in the last 168 hours.     Signed:  Kayleen Memos, MD [facility] 11/18/2020, 1:06 PM

## 2020-11-18 NOTE — Progress Notes (Signed)
No events overnight.  Hgb 6.7. Denies any symptomatic anemia.  Has been walking around in the room to the bathroom without difficulty.  Endoscopy scheduled for today for positive hemoccult.  The incisional vac has a good seal. Plan is to continue this for two weeks. Will need to be switched from the hospital unit to the portable home unit upon discharge from the hospital. Patient can be discharged home with medically stable.  He is stable from an orthopedic standpoint. He should continue to mobilize with PT while he is here. Jamse Arn nurse care manager will see the patient again today to help with the discharge process and to make sure that all services are properly coordinated.

## 2020-11-18 NOTE — Progress Notes (Signed)
PT Cancellation Note  Patient Details Name: Carl Wissmann Sr. MRN: [id number] DOB: 09-07-1960   Cancelled Treatment:    Reason Eval/Treat Not Completed: Patient at procedure or test/unavailable pt off floor for EGD and then HD. Will follow.   Marguarite Arbour A Lukus Binion 11/18/2020, 11:02 AM Marisa Severin, PT, DPT Acute Rehabilitation Services Pager [phone] Office [phone]

## 2020-11-18 NOTE — Op Note (Signed)
[facility] Patient Name: [name] Procedure Date : 11/18/2020 MRN: [id number] Attending MD: Gladstone Pih. Candis Schatz , MD Date of Birth: Sep 26, 1960 CSN: [id number] Age: 60 Admit Type: Inpatient Procedure:                Upper GI endoscopy Indications:              Suspected upper gastrointestinal bleeding Providers:                Nicki Reaper E. Candis Schatz, MD, Vista Lawman, RN, Laverda Sorenson, Technician, Lance Coon, CRNA Referring MD:              Medicines:                Monitored Anesthesia Care Complications:            No immediate complications. Estimated Blood Loss:     Estimated blood loss was minimal. Procedure:                Pre-Anesthesia Assessment:                           - Prior to the procedure, a History and Physical                            was performed, and patient medications and                            allergies were reviewed. The patient's tolerance of                            previous anesthesia was also reviewed. The risks                            and benefits of the procedure and the sedation                            options and risks were discussed with the patient.                            All questions were answered, and informed consent                            was obtained. Prior Anticoagulants: The patient has                            taken no previous anticoagulant or antiplatelet                            agents except for aspirin. ASA Grade Assessment:                            III - A patient with severe systemic disease. After  reviewing the risks and benefits, the patient was                            deemed in satisfactory condition to undergo the                            procedure.                           After obtaining informed consent, the endoscope was                            passed under direct vision. Throughout the                             procedure, the patient's blood pressure, pulse, and                            oxygen saturations were monitored continuously. The                            GIF-H190 ([id number] Olympus endoscope was introduced                            through the mouth, and advanced to the second part                            of duodenum. The upper GI endoscopy was                            accomplished without difficulty. The patient                            tolerated the procedure well. Scope In: Scope Out: Findings:      The examined portions of the nasopharynx, oropharynx and larynx were       normal.      Diffuse mucosal changes characterized by sloughing were found in the       middle third of the esophagus and in the lower third of the esophagus.       Biopsies were taken with a cold forceps for histology. Estimated blood       loss was minimal.      The exam of the esophagus was otherwise normal.      A 3 cm hiatal hernia was present.      Diffuse prominent gastric folds were found in the gastric body. Biopsies       were taken with a cold forceps for histology. Estimated blood loss was       minimal.      One non-bleeding superficial gastric ulcer with adherent clot was found       at the pylorus. There was significant respiratory variation of the       pylorus and duodenum. The lesion was 4 mm in largest dimension.       Coagulation for bleeding prevention using bipolar probe was successful.       Estimated blood loss: none.  The exam of the stomach was otherwise normal.      A small non-bleeding diverticulum was found in the second portion of the       duodenum.      Diffuse moderately congested mucosa and prominent folds was found in the       duodenal bulb and in the second portion of the duodenum. Initially the       appearance was suggestive of a possible subepithelial mass, but no       obvious discrete mass was seen. No evidence of bleeding or ulceration.       Biopsies  were taken with a cold forceps for histology. Estimated blood       loss was minimal. Impression:               - The examined portions of the nasopharynx,                            oropharynx and larynx were normal.                           - Mucosal changes in the esophagus characterized by                            mucosal sloughing. This appeared consistent with                            esophagitis dissecans. Biopsied.                           - 3 cm hiatal hernia.                           - Enlarged gastric folds. Biopsied.                           - Non-bleeding gastric ulcer with adherent clot.                            Treated with bipolar cautery. This ulcer may have                            been more mechanical in etiology rather than from                            NSAIDs based on the location within the pylorus                           - Non-bleeding duodenal diverticulum.                           - Congested duodenal mucosa. Biopsied. Recommendation:           - Return patient to hospital ward for ongoing care.                           - Resume previous diet.                           -  Continue present medications.                           - Await pathology results.                           - Use Protonix (pantoprazole) 40 mg PO BID for 8                            weeks.                           - Use sucralfate suspension 1 gram PO QID for 2                            weeks.                           - Repeat upper endoscopy in 8 weeks to check                            healing.                           - GI will sign off at this time                           - Our office will contact the patient to setup a                            follow up clinic visit and set up the repeat EGD. Procedure Code(s):        --- Professional ---                           [id number], 36, Esophagogastroduodenoscopy, flexible,                            transoral; with  control of bleeding, any method                           43239, Esophagogastroduodenoscopy, flexible,                            transoral; with biopsy, single or multiple Diagnosis Code(s):        --- Professional ---                           K22.8, Other specified diseases of esophagus                           K44.9, Diaphragmatic hernia without obstruction or                            gangrene  K29.60, Other gastritis without bleeding                           K25.4, Chronic or unspecified gastric ulcer with                            hemorrhage                           K31.89, Other diseases of stomach and duodenum                           K57.10, Diverticulosis of small intestine without                            perforation or abscess without bleeding CPT copyright 2019 American [employer] Association. All rights reserved. The codes documented in this report are preliminary and upon coder review may  be revised to meet current compliance requirements. Ruari Mudgett E. Candis Schatz, MD 11/18/2020 3:18:09 PM This report has been signed electronically. Number of Addenda: 0

## 2020-11-19 DIAGNOSIS — M1612 Unilateral primary osteoarthritis, left hip: Secondary | ICD-10-CM | POA: Diagnosis not present

## 2020-11-19 LAB — HEMOGLOBIN AND HEMATOCRIT, BLOOD
HCT: 20.5 % — ABNORMAL LOW (ref 39.0–52.0)
Hemoglobin: 6.4 g/dL — CL (ref 13.0–17.0)

## 2020-11-19 MED ORDER — SUCRALFATE 1 GM/10ML PO SUSP: 1.0000 g | Freq: Three times a day (TID) | ORAL | 0 refills | Status: DC

## 2020-11-19 MED ORDER — PANTOPRAZOLE SODIUM 40 MG PO TBEC: 40.0000 mg | DELAYED_RELEASE_TABLET | Freq: Two times a day (BID) | ORAL | 0 refills | Status: DC

## 2020-11-19 NOTE — Plan of Care (Signed)

## 2020-11-19 NOTE — Progress Notes (Signed)
Pt Hgb at 6.4 this am. Pt still refusing blood products, stating he would like to speak with his attending in the am before making the decision. Continue to monitor.   Lovey Newcomer, NP [facility] 7p-7a [phone]

## 2020-11-19 NOTE — Progress Notes (Signed)
[facility] Progress Note   Subjective: Had discharge orders but now staying over W/E. HGB down to 6.4. GI following. Plan is to watch over W/E. Transfuse Monday on HD. In very good spirits. Up in chair.   Objective Vitals:   11/18/20 1515 11/18/20 1604 11/18/20 2038 11/19/20 0736  BP:  115/77 129/74 107/69  Pulse:  86 93 85  Resp:  17 18 17   Temp:  98.4 F (36.9 C) 99.5 F (37.5 C) 98.7 F (37.1 C)  TempSrc:  Oral Oral Oral  SpO2: 94% 100% (!) 77% 99%  Weight:      Height:       Physical Exam General: Pleasant older male in NAD Heart: S1,S2 RRR No M/R/G Lungs: CTAB A/P Abdomen: Soft, NABS Extremities: No edema. Wound Vac L hip Dialysis Access: R AVF aneurysmal +T/B    Additional Objective Labs: Basic Metabolic Panel: Recent Labs  Lab 11/15/20 0033 11/16/20 0850 11/18/20 0800  NA 135 132* 133*  K 4.8 4.5 4.4  CL 97* 94* 95*  CO2 25 22 24   GLUCOSE 103* 88 80  BUN 34* 61* 43*  CREATININE 7.64* 9.55* 9.07*  CALCIUM 8.0* 7.7* 8.2*  PHOS  --  4.6 4.2   Liver Function Tests: Recent Labs  Lab 11/16/20 0850 11/18/20 0800  ALBUMIN 2.2* 2.1*   No results for input(s): LIPASE, AMYLASE in the last 168 hours. CBC: Recent Labs  Lab 11/15/20 0033 11/16/20 0850 11/16/20 2118 11/17/20 1345 11/18/20 0211 11/19/20 0100  WBC 12.4* 9.7 10.1 9.8 6.8  --   NEUTROABS 11.3*  --   --   --   --   --   HGB 7.0* 5.4* 7.4* 7.3* 6.7* 6.4*  HCT 22.6* 17.3* 23.3* 22.8* 20.9* 20.5*  MCV 88.3 86.9 87.3 88.4 87.1  --   PLT 260 217 259 318 233  --    Blood Culture    Component Value Date/Time   SDES BLOOD LEFT HAND 08/01/2015 0624   SPECREQUEST BOTTLES DRAWN AEROBIC AND ANAEROBIC  5CC 08/01/2015 0624   CULT NO GROWTH 5 DAYS 08/01/2015 0624   REPTSTATUS 08/06/2015 FINAL 08/01/2015 0624    Cardiac Enzymes: No results for input(s): CKTOTAL, CKMB, CKMBINDEX, TROPONINI in the last 168 hours. CBG: No results for input(s): GLUCAP in the last 168 hours. Iron  Studies:  Recent Labs    11/16/20 2118  IRON 23*  TIBC 157*  FERRITIN 1,472*   @lablastinr3 @ Studies/Results: No results found. Medications:  methocarbamol (ROBAXIN) IV      (feeding supplement) PROSource Plus  30 mL Oral BID BM   sodium chloride   Intravenous Once   aspirin  81 mg Oral BID   atorvastatin  10 mg Oral QPM   camphor-menthol   Topical BID   Chlorhexidine Gluconate Cloth  6 each Topical Q0600   cinacalcet  120 mg Oral Q supper   darbepoetin (ARANESP) injection - DIALYSIS  200 mcg Intravenous Q Wed-HD   docusate sodium  100 mg Oral BID   midodrine  10 mg Oral Q M,W,F   oxyCODONE  10 mg Oral Q12H   pantoprazole  40 mg Oral BID   polyethylene glycol  17 g Oral Daily   sevelamer carbonate  1,600 mg Oral With snacks   sevelamer carbonate  4,000 mg Oral TID with meals   sucralfate  1 g Oral TID WC & HS   topiramate  25 mg Oral BID     Dialysis Orders: MWF at [facility],  400/500, EDW 88kg, 2K/2.5Ca, AVF - heparin 2000 units IV TIW - Mircera 141mcg IV q 2 weeks (last 9/21 for Hgb 7.7)   Assessment/Plan:  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  ESRD:  Continue HD per MWF schedule - Next HD 11/21/2020  BP/volume: BP controlled, no edema on exam. Uses midodrine for HD.  Anemia (ESRD + GIB): Hgb down to 5.4, agreed for 1U PRBCs, given on 10/5. FOBT positive. S/p Aranesp 277mcg on 10/4. Tsat low - allergy to IV iron listed in chart. Hgb trending down again. GI consulted. 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 Patient is Jehovah Witness but has agreed to transfusion if needed.   Metabolic bone disease: Ca/Phos good - continue home binders/sensipar. Nutrition: Alb low, adding supplements.  T2DM   Hx CVA  Annamaria Salah H. Davona Kinoshita NP-C 11/19/2020, 10:51 AM  [employer] [phone]

## 2020-11-19 NOTE — Plan of Care (Signed)
  Problem: Education: Goal: Knowledge of General Education information will improve Description: Including pain rating scale, medication(s)/side effects and non-pharmacologic comfort measures 11/19/2020 0336 by Claire Shown, RN Outcome: Progressing 11/19/2020 0212 by Claire Shown, RN Outcome: Progressing   Problem: Activity: Goal: Risk for activity intolerance will decrease Outcome: Progressing   Problem: Pain Managment: Goal: General experience of comfort will improve 11/19/2020 0336 by Claire Shown, RN Outcome: Progressing 11/19/2020 0212 by Claire Shown, RN Outcome: Progressing   Problem: Safety: Goal: Ability to remain free from injury will improve 11/19/2020 0336 by Claire Shown, RN Outcome: Progressing 11/19/2020 0212 by Claire Shown, RN Outcome: Progressing

## 2020-11-19 NOTE — Progress Notes (Signed)
Pt HGB is 6.4. Provider on call notified. Pt is requesting to speak to a Provider before he makes a decision on getting blood. Pt has been educated. Pt is resting at this time. No sign of distress. Will continue to monitor.

## 2020-11-19 NOTE — Progress Notes (Signed)
Physical Therapy Treatment Patient Details Name: Carl Demas Sr. MRN: [id number] DOB: [birth year] Today's Date: 11/19/2020   History of Present Illness Patient is a 60 y/o male who presents s/p left THA 11/14/20 direct anterior approach. PMH includes TIA, ESRD, DM, HTN, Rt TSA, depression, anxiety, stroke, seizures.    PT Comments    Pt. Demos improved activity tolerance, LE strength and independence during today's treatment session and is able to amb inc distance with dec level of support.  Pt. Demos fair compliance to instruction regarding AD safety and gait sequencing, but no LOB with activity.  Hg remains low, but pt. Appears asymptomatic at this time.  Would benefit from continued skilled PT in acute care to progress gait distance (may need chair follow for safety with low Hg), practice stairs, and improve safety awareness with functional mobility.  Recommendations for follow up therapy are one component of a multi-disciplinary discharge planning process, led by the attending physician.  Recommendations may be updated based on patient status, additional functional criteria and insurance authorization.  Follow Up Recommendations  Follow surgeon's recommendation for DC plan and follow-up therapies;Supervision for mobility/OOB (Pt. is mobilizing well today, if surgeon agrees, could transition home with [facility] PT)     Equipment Recommendations  Rolling walker with 5" wheels    Recommendations for Other Services       Precautions / Restrictions Restrictions Weight Bearing Restrictions: Yes LLE Weight Bearing: Weight bearing as tolerated     Mobility  Bed Mobility               General bed mobility comments: Pt. already sitting EOB when PT arrives. Patient Response: Cooperative  Transfers   Equipment used: Conservation officer, nature (2 wheeled) Transfers: Sit to/from Stand Sit to Stand: [employer] transfer comment: Performs sit > stand with CGA from elevated bed  height.  VC reminders for safety with hand placement.  Education to keep L LE out in front to dec pain with transfer, but pt. refuses.  States his R shoulder is not good from previous sx 2 months ago and would prefer to keep L LE underneath him during sit > stand.  Ambulation/Gait Ambulation/Gait assistance: Supervision Gait Distance (Feet): 20 Feet Assistive device: Rolling walker (2 wheeled)       General Gait Details: Pt. demos poor carryover of proper gait sequencing, wants to lead with  R LE.  PT explains proper sequencing to unweight  L LE, but pt. states he prefers to do it the other way.  PT takes time to explain in further detail why positioning of RW and foot placement is so important, but pt. does not demo understanding.  Pt. alternates throughout gait training leading with L and R LE.  Pt. also has tendency to step through and is educated that if leg is hurting, stepping to the other foot will assist in decreasing pain, pt. demos poor carryover with technique.  No LOB with activity.  Gait distance limited to in room secondary to pt. having Hg of 6.4 and refusing blood transfusion; decided to stay close to furniture for safety with activity in even pt. becomes lightheaded/dizzy.  Pt. is upset and distracted during gait training, has some personal issues at home that are decreasing his focus.   Stairs             Wheelchair Mobility    Modified Rankin (Stroke Patients Only)       Balance  Standing balance support: During functional activity Standing balance-Leahy Scale: Fair                              Cognition Arousal/Alertness: Awake/alert Behavior During Therapy: WFL for tasks assessed/performed Overall Cognitive Status: Within Functional Limits for tasks assessed                                 General Comments: Pt. sitting up EOB when PT arrives.  Agreeable to work with PT, wants to practice walking so he can go home.       Exercises Total Joint Exercises Heel Slides: AAROM;Left;20 reps Hip ABduction/ADduction: AAROM;Left;20 reps Straight Leg Raises: AAROM;Left;20 reps Long Arc Quad: AAROM;Left;20 reps Marching in Standing: AAROM;Left;20 reps;Seated    General Comments        Pertinent Vitals/Pain Pain Assessment: 0-10 Pain Score: 7  Pain Location: L hip Pain Descriptors / Indicators: Aching;Constant;Discomfort;Tender;Tightness Pain Intervention(s): Limited activity within patient's tolerance;Monitored during session;Repositioned    Home Living                      Prior Function            PT Goals (current goals can now be found in the care plan section) Progress towards PT goals: Progressing toward goals    Frequency           PT Plan Current plan remains appropriate    Co-evaluation              AM-PAC PT "6 Clicks" Mobility   Outcome Measure  Help needed turning from your back to your side while in a flat bed without using bedrails?: A Little Help needed moving from lying on your back to sitting on the side of a flat bed without using bedrails?: A Little Help needed moving to and from a bed to a chair (including a wheelchair)?: A Little Help needed standing up from a chair using your arms (e.g., wheelchair or bedside chair)?: A Little Help needed to walk in hospital room?: A Little Help needed climbing 3-5 steps with a railing? : A Little 6 Click Score: 18    End of Session Equipment Utilized During Treatment: Gait belt Activity Tolerance: Patient tolerated treatment well Patient left: in chair;with call bell/phone within reach;with chair alarm set         Time: 0102-7253 PT Time Calculation (min) (ACUTE ONLY): 29 min  Charges:  $Gait Training: 8-22 mins $Therapeutic Exercise: 8-22 mins                     Belvia Gotschall A. Adamaris King, PT, DPT Acute Rehabilitation Services Office: [place] 11/19/2020, 9:51 AM

## 2020-11-19 NOTE — Progress Notes (Signed)
Progress note  Carl Degidio Sr. TMH:[id number] DOB: 1960/10/22  PCP: Carl Amel, MD  Admit date: 11/14/2020 Discharge date: 11/19/2020  Time spent: 35 minutes.    Recommendations for Outpatient Follow-up:   Follow-up with orthopedic surgery Follow-up with your primary care provider Keep your hemodialysis appointments. Take your medications as prescribed Continue PT OT with assistance and fall precautions.  Discharge Diagnoses:  Active Hospital Problems   Diagnosis Date Noted   Primary osteoarthritis of left hip 10/07/2020   Mucosal abnormality of esophagus    Mucosal abnormality of stomach    Mucosal abnormality of duodenum    Upper GI bleed    Left hip pain 11/16/2020   Type 2 diabetes mellitus with other specified complication ([place]) 92/12/9415   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/14/2020   ESRD on hemodialysis ([place]) 03/08/2020   Acute gastric ulcer with hemorrhage    Essential hypertension    Obstructive sleep apnea 02/08/2015   HLD (hyperlipidemia)    History of lacunar cerebrovascular accident (CVA) 05/23/2014   Anemia of renal disease 05/23/2014   GERD (gastroesophageal reflux disease) 12/09/2012    Resolved Hospital Problems  No resolved problems to display.    Discharge Condition: Stable  Diet recommendation: Resume previous diet  Vitals:   11/18/20 2038 11/19/20 0736  BP: 129/74 107/69  Pulse: 93 85  Resp: 18 17  Temp: 99.5 F (37.5 C) 98.7 F (37.1 C)  SpO2: (!) 77% 99%    History of present illness:  60-year with history of ESRD on HD Monday Wednesday Friday, HTN, DM2, TIA, lacunar CVA, GERD, anemia of chronic disease, HLD, OSA, osteoarthritis of the left hip admitted for hip replacement.  Medical team consulted for medication management. Care transferred to [facility] on 11/16/2020.  Hospital course complicated by symptomatic anemia with hemoglobin as low as 5.4 K with positive FOBT.  He was transfused 1 unit PRBC at hemodialysis on  11/16/2020, hemoglobin corrected to 7.3.  Seen by GI, EGD showed pyloric channel ulcer with visible vessel status post BICAP.  Further drop in hemoglobin, down to 6.4 K.  GI recommends monitoring over the weekend.    Patient is Jehovah witness and states that he will take blood transfusion if his hemoglobin drops to the 5s as it did on 11/16/20 for which he received 1 unit PRBC at dialysis.  Next hemodialysis session, planned on Monday, 11/21/2020.   11/19/2020: Patient was seen and examined at his bedside.  He denies dizziness, chest pain, shortness of breath, or palpitations.  Mild conversational dyspnea noted.  Hospital Course:  Principal Problem:   Primary osteoarthritis of left hip Active Problems:   GERD (gastroesophageal reflux disease)   History of lacunar cerebrovascular accident (CVA)   Anemia of renal disease   HLD (hyperlipidemia)   Essential hypertension   Acute gastric ulcer with hemorrhage   ESRD on hemodialysis ([place])   Obstructive sleep apnea   Type 2 diabetes mellitus with other specified complication ([facility])   Status post total replacement of left hip   Left hip pain   Mucosal abnormality of esophagus   Mucosal abnormality of stomach   Mucosal abnormality of duodenum   Upper GI bleed  Post left total hip arthroplasty by orthopedic surgery Dr. Erlinda Hong on 11/15/2020 POD #4. Primary osteoarthritis of left hip Pain management and postop DVT prophylaxis per orthopedic surgery. PT recommended home health PT. Continue PT OT with assistance and fall precautions.     Acute blood loss anemia, positive FOBT, iron deficiency  anemia, anemia of chronic disease. Transfuse 1 unit PRBC at hemodialysis on 11/16/2020 for hemoglobin of 5.4.  FOBT positive.  Seen by GI, post EGD on 11/18/2020.   Evidence of iron deficiency on iron studies. Hemoglobin 6.4 on 11/19/20. Transfuse additional 1 unit PRBC if patient is agreeable. GI following.  Post EGD on 11/18/2020 by Dr. Candis Schatz:  Esophagitis  dissecans. Biopsied. - 3 cm hiatal hernia. - Enlarged gastric folds. Biopsied. - Non-bleeding gastric ulcer with adherent clot. Treated with bipolar cautery. This ulcer may have been more mechanical in etiology rather than from NSAIDs based on the location within the pylorus - Non-bleeding duodenal diverticulum. - Congested duodenal mucosa. Biopsied.  Impression: - Await pathology results. - Use Protonix (pantoprazole) 40 mg PO BID for 8 weeks. - Use sucralfate suspension 1 gram PO QID for 2 weeks. - Repeat upper endoscopy in 8 weeks to check healing. - GI   Acute left ankle pain -Some soft tissue swelling.  No evidence of infection.  Uric acid levels are normal.  Continue to monitor, pain control as needed.   Management per orthopedic surgery.      ESRD on hemodialysis ([place]) -HD MWF.  Nephrology following. Completed HD on 11/16/2020 HD 11/18/2020 Next HD 11/21/2020.    GERD (gastroesophageal reflux disease) IV Protonix 40 mg twice daily due to concern for upper GI bleed. EGD 11/18/2020.     History of lacunar cerebrovascular accident (CVA) On aspirin 81 mg twice daily for DVT prophylaxis, will switch to subcu Lovenox if okay with surgery. Continue home Lipitor PT OT assessment, recommendation for home health PT     HLD (hyperlipidemia) - Continue home Lipitor     Essential hypertension Continue home midodrine MWF     Obstructive sleep apnea -Not on home CPAP.   Dry skin from ESRD Possible uremic pruritus As needed p.o. Benadryl and Sarna lotion       Code Status: Full code     Code Status: Full code   Consultants: Nephrology Orthopedic surgery GI.   Procedures: HD on 11/16/2020. Plan for HD on 11/18/2020   Antimicrobials: None.   DVT prophylaxis: Aspirin 81 mg twice daily.    Discharge Exam: BP 107/69 (BP Location: Left Arm)   Pulse 85   Temp 98.7 F (37.1 C) (Oral)   Resp 17   Ht 6\' 1"  (1.854 m)   Wt 87.4 kg   SpO2 99%   BMI 25.42 kg/m   General: 60 y.o. year-old male well-developed well-nourished in no acute distress.  He is alert and oriented x3. Cardiovascular: Regular rate and rhythm no rubs or gallops.  Respiratory: Clear to auscultation with no wheezes or rales.  Abdomen: Soft nontender normal bowel sounds present.  Musculoskeletal: No lower extremity edema bilaterally.   Skin: No ulcerative lesions noted. Psychiatry: Mood is appropriate for condition and setting.  Discharge Instructions You were cared for by a hospitalist during your hospital stay. If you have any questions about your discharge medications or the care you received while you were in the hospital after you are discharged, you can call the unit and asked to speak with the hospitalist on call if the hospitalist that took care of you is not available. Once you are discharged, your primary care physician will handle any further medical issues. Please note that NO REFILLS for any discharge medications will be authorized once you are discharged, as it is imperative that you return to your primary care physician (or establish a relationship with a primary care  physician if you do not have one) for your aftercare needs so that they can reassess your need for medications and monitor your lab values.   Allergies as of 11/19/2020       Reactions   Infed [iron Dextran] Other (See Comments)   Decreased BP    Oxycodone Nausea Only   Vicodin [hydrocodone-acetaminophen] Other (See Comments)   Decreased BP   Hydrocodone-acetaminophen    Other reaction(s): lower BP   Diclofenac Other (See Comments)   Unknown        Medication List     STOP taking these medications    gabapentin 100 MG capsule Commonly known as: NEURONTIN   ibuprofen 800 MG tablet Commonly known as: ADVIL   omeprazole 20 MG capsule Commonly known as: PRILOSEC   QC TUMERIC COMPLEX PO       TAKE these medications    (feeding supplement) PROSource Plus liquid Take 30 mLs by mouth 2  (two) times daily between meals for 7 days.   aspirin EC 81 MG tablet Take 1 tablet (81 mg total) by mouth 2 (two) times daily. To be taken after surgery What changed:  when to take this additional instructions   atorvastatin 10 MG tablet Commonly known as: LIPITOR Take 10 mg by mouth every evening.   camphor-menthol lotion Commonly known as: SARNA Apply topically 2 (two) times daily.   cephALEXin 500 MG capsule Commonly known as: Keflex Take 1 capsule (500 mg total) by mouth 4 (four) times daily. To be taken after surgery   cinacalcet 60 MG tablet Commonly known as: SENSIPAR Take 120 mg by mouth daily with supper.   cromolyn 4 % ophthalmic solution Commonly known as: OPTICROM Place 1 drop into both eyes daily as needed (redness).   DIALYVITE PO Take 1 tablet by mouth daily.   docusate sodium 100 MG capsule Commonly known as: Colace Take 1 capsule (100 mg total) by mouth daily as needed. What changed: when to take this   Lokelma 10 g Pack packet Generic drug: sodium zirconium cyclosilicate Take 1 packet by mouth daily.   methocarbamol 500 MG tablet Commonly known as: Robaxin Take 1 tablet (500 mg total) by mouth 2 (two) times daily as needed. To be taken after surgery   midodrine 10 MG tablet Commonly known as: PROAMATINE Take 10 mg by mouth See admin instructions. Take 10 mg by mouth three times a week as directed, bring to dialysis. May need an additional tablet during treatment.   ondansetron 4 MG tablet Commonly known as: Zofran Take 1 tablet (4 mg total) by mouth every 8 (eight) hours as needed for nausea or vomiting.   oxyCODONE-acetaminophen 5-325 MG tablet Commonly known as: Percocet Take 1-2 tablets by mouth every 6 (six) hours as needed. To be taken after surgery   pantoprazole 40 MG tablet Commonly known as: PROTONIX Take 1 tablet (40 mg total) by mouth 2 (two) times daily.   sevelamer carbonate 800 MG tablet Commonly known as: RENVELA Take  1,600-4,000 mg by mouth See admin instructions. Take 4000 mg by mouth three times daily with meals and 1600-2400 mg with snacks   sucralfate 1 GM/10ML suspension Commonly known as: CARAFATE Take 10 mLs (1 g total) by mouth 4 (four) times daily -  with meals and at bedtime.   topiramate 25 MG tablet Commonly known as: TOPAMAX Take 1 tablet (25 mg total) by mouth 2 (two) times daily.   vitamin E 180 MG (400 UNITS) capsule Take 400 Units  by mouth daily.               Durable Medical Equipment  (From admission, onward)           Start     Ordered   11/14/20 2028  DME Walker rolling  Once       Question:  Patient needs a walker to treat with the following condition  Answer:  History of hip replacement   11/14/20 2027   11/14/20 2028  DME 3 n 1  Once        11/14/20 2027   11/14/20 2028  DME Bedside commode  Once       Question:  Patient needs a bedside commode to treat with the following condition  Answer:  History of hip replacement   11/14/20 2027           Allergies  Allergen Reactions   Infed [Iron Dextran] Other (See Comments)    Decreased BP    Oxycodone Nausea Only   Vicodin [Hydrocodone-Acetaminophen] Other (See Comments)    Decreased BP   Hydrocodone-Acetaminophen     Other reaction(s): lower BP   Diclofenac Other (See Comments)    Unknown    Follow-up Information     Leandrew Koyanagi, MD. Schedule an appointment as soon as possible for a visit in 1 week(s).   Specialty: Orthopedic Surgery Contact information: [place] [address] [phone]         Carl Amel, MD. Call today.   Specialty: Family Medicine Why: Please call for a posthospital follow-up appointment. Contact information: [place] [address] [phone]         Buford Dresser, MD .   Specialty: Cardiology Contact information: [address] [place] [address] [phone]                   The results of significant diagnostics from this hospitalization (including imaging, microbiology, ancillary and laboratory) are listed below for reference.    Significant Diagnostic Studies: DG Pelvis Portable  Result Date: 11/14/2020 CLINICAL DATA:  Status post left arthroplasty EXAM: PORTABLE PELVIS 1 VIEWS COMPARISON:  Films from earlier in the same day. FINDINGS: Pelvic ring is intact. Left hip replacement is now seen in satisfactory position. No acute bony or soft tissue abnormality is noted. Vascular calcifications are seen. IMPRESSION: Status post left hip replacement Electronically Signed   By: Inez Catalina M.D.   On: 11/14/2020 19:04   DG Ankle Left Port  Result Date: 11/14/2020 CLINICAL DATA:  Ankle pain EXAM: PORTABLE LEFT ANKLE - 2 VIEW COMPARISON:  None. FINDINGS: Soft tissue swelling is present. Age indeterminate avulsions at the tips of the medial and lateral malleoli. Ankle mortise is symmetric. Vascular calcifications. IMPRESSION: Soft tissue swelling. Age indeterminate avulsions off the tips of the medial and lateral malleoli Electronically Signed   By: Donavan Foil M.D.   On: 11/14/2020 19:42   DG C-Arm 1-60 Min-No Report  Result Date: 11/14/2020 Fluoroscopy was utilized by the requesting physician.  No radiographic interpretation.   DG C-Arm 1-60 Min-No Report  Result Date: 11/14/2020 Fluoroscopy was utilized by the requesting physician.  No radiographic interpretation.   MYOCARDIAL PERFUSION IMAGING  Result Date: 10/20/2020   The study is normal. The study is low risk.   No ST deviation was noted.   LV perfusion is normal. There is no evidence of ischemia. There is no evidence of infarction.   Left ventricular  function is normal. Nuclear stress EF: 58 %. The left ventricular ejection fraction is normal (55-65%).   Prior study not available for comparison. Reduced counts were noted in the inferior segments with normal wall motion consistent with diaphragm  attenuation.  No evidence of ischemia or prior infarction.  This is a low risk study.   DG HIP OPERATIVE UNILAT WITH PELVIS LEFT  Result Date: 11/14/2020 CLINICAL DATA:  Left hip replacement EXAM: OPERATIVE LEFT HIP WITH PELVIS COMPARISON:  05/19/2020 FLUOROSCOPY TIME:  Radiation Exposure Index (as provided by the fluoroscopic device): 1.98 mGy If the device does not provide the exposure index: Fluoroscopy Time:  18 seconds Number of Acquired Images:  4 FINDINGS: Initial images demonstrate significant destruction of the left femoral head progressed in the interval from the prior exam. Subsequent images demonstrate removal of the femoral neck and femoral head with placement of left hip prosthesis. No acute bony or soft tissue abnormality is seen. Lucent lesion is again noted in the region of the right femoral neck and head. IMPRESSION: Left hip arthroplasty Electronically Signed   By: Inez Catalina M.D.   On: 11/14/2020 19:05    Microbiology: Recent Results (from the past 240 hour(s))  Surgical pcr screen     Status: None   Collection Time: 11/10/20  8:59 AM   Specimen: Nasal Mucosa; Nasal Swab  Result Value Ref Range Status   MRSA, PCR NEGATIVE NEGATIVE Final   Staphylococcus aureus NEGATIVE NEGATIVE Final    Comment: (NOTE) The Xpert SA Assay (FDA approved for NASAL specimens in patients 80 years of age and older), is one component of a comprehensive surveillance program. It is not intended to diagnose infection nor to guide or monitor treatment. Performed at [facility], [place] [address], [place], [place] [address]   SARS CORONAVIRUS 2 (TAT 6-24 HRS) Nasopharyngeal Nasopharyngeal Swab     Status: None   Collection Time: 11/10/20  9:00 AM   Specimen: Nasopharyngeal Swab  Result Value Ref Range Status   SARS Coronavirus 2 NEGATIVE NEGATIVE Final    Comment: (NOTE) SARS-CoV-2 target nucleic acids are NOT DETECTED.  The SARS-CoV-2 RNA is generally detectable in upper and  lower respiratory specimens during the acute phase of infection. Negative results do not preclude SARS-CoV-2 infection, do not rule out co-infections with other pathogens, and should not be used as the sole basis for treatment or other patient management decisions. Negative results must be combined with clinical observations, patient history, and epidemiological information. The expected result is Negative.  Fact Sheet for Patients: SugarRoll.be  Fact Sheet for Healthcare Providers: https://www.woods-mathews.com/  This test is not yet approved or cleared by the [place] FDA and  has been authorized for detection and/or diagnosis of SARS-CoV-2 by FDA under an Emergency Use Authorization (EUA). This EUA will remain  in effect (meaning this test can be used) for the duration of the COVID-19 declaration under Se ction 564(b)(1) of the Act, 21 U.S.C. section 360bbb-3(b)(1), unless the authorization is terminated or revoked sooner.  Performed at [facility], [place] [address], [place], [place] [address]      Labs: Basic Metabolic Panel: Recent Labs  Lab 11/14/20 1431 11/15/20 0033 11/16/20 0850 11/18/20 0800  NA 137 135 132* 133*  K 3.8 4.8 4.5 4.4  CL 98 97* 94* 95*  CO2  --  25 22 24   GLUCOSE 70 103* 88 80  BUN 29* 34* 61* 43*  CREATININE 7.50* 7.64* 9.55* 9.07*  CALCIUM  --  8.0* 7.7* 8.2*  PHOS  --   --  4.6 4.2   Liver Function Tests: Recent Labs  Lab 11/16/20 0850 11/18/20 0800  ALBUMIN 2.2* 2.1*   No results for input(s): LIPASE, AMYLASE in the last 168 hours. No results for input(s): AMMONIA in the last 168 hours. CBC: Recent Labs  Lab 11/15/20 0033 11/16/20 0850 11/16/20 2118 11/17/20 1345 11/18/20 0211 11/19/20 0100  WBC 12.4* 9.7 10.1 9.8 6.8  --   NEUTROABS 11.3*  --   --   --   --   --   HGB 7.0* 5.4* 7.4* 7.3* 6.7* 6.4*  HCT 22.6* 17.3* 23.3* 22.8* 20.9* 20.5*  MCV 88.3 86.9 87.3 88.4 87.1  --    PLT 260 217 259 318 233  --    Cardiac Enzymes: No results for input(s): CKTOTAL, CKMB, CKMBINDEX, TROPONINI in the last 168 hours. BNP: BNP (last 3 results) No results for input(s): BNP in the last 8760 hours.  ProBNP (last 3 results) No results for input(s): PROBNP in the last 8760 hours.  CBG: No results for input(s): GLUCAP in the last 168 hours.     Signed:  Kayleen Memos, MD [facility] 11/19/2020, 11:43 AM

## 2020-11-19 NOTE — Progress Notes (Signed)
Subjective: No complaints.  Feeling well.  Objective: Vital signs in last 24 hours: Temp:  [97.4 F (36.3 C)-99.5 F (37.5 C)] 98.7 F (37.1 C) (10/08 0736) Pulse Rate:  [81-93] 85 (10/08 0736) Resp:  [10-18] 17 (10/08 0736) BP: (100-138)/(62-80) 107/69 (10/08 0736) SpO2:  [77 %-100 %] 99 % (10/08 0736) Weight:  [87.4 kg-88.2 kg] 87.4 kg (10/07 1120) Last BM Date: 11/18/20  Intake/Output from previous day: 10/07 0701 - 10/08 0700 In: 400 [I.V.:400] Out: 1500  Intake/Output this shift: No intake/output data recorded.  General appearance: alert and no distress GI: soft, non-tender; bowel sounds normal; no masses,  no organomegaly  Lab Results: Recent Labs    11/16/20 2118 11/17/20 1345 11/18/20 0211 11/19/20 0100  WBC 10.1 9.8 6.8  --   HGB 7.4* 7.3* 6.7* 6.4*  HCT 23.3* 22.8* 20.9* 20.5*  PLT 259 318 233  --    BMET Recent Labs    11/16/20 0850 11/18/20 0800  NA 132* 133*  K 4.5 4.4  CL 94* 95*  CO2 22 24  GLUCOSE 88 80  BUN 61* 43*  CREATININE 9.55* 9.07*  CALCIUM 7.7* 8.2*   LFT Recent Labs    11/18/20 0800  ALBUMIN 2.1*   PT/INR No results for input(s): LABPROT, INR in the last 72 hours. Hepatitis Panel No results for input(s): HEPBSAG, HCVAB, HEPAIGM, HEPBIGM in the last 72 hours. C-Diff No results for input(s): CDIFFTOX in the last 72 hours. Fecal Lactopherrin No results for input(s): FECLLACTOFRN in the last 72 hours.  Studies/Results: No results found.  Medications: Scheduled:  (feeding supplement) PROSource Plus  30 mL Oral BID BM   sodium chloride   Intravenous Once   aspirin  81 mg Oral BID   atorvastatin  10 mg Oral QPM   camphor-menthol   Topical BID   Chlorhexidine Gluconate Cloth  6 each Topical Q0600   cinacalcet  120 mg Oral Q supper   darbepoetin (ARANESP) injection - DIALYSIS  200 mcg Intravenous Q Wed-HD   docusate sodium  100 mg Oral BID   midodrine  10 mg Oral Q M,W,F   oxyCODONE  10 mg Oral Q12H   pantoprazole  40  mg Oral BID   polyethylene glycol  17 g Oral Daily   sevelamer carbonate  1,600 mg Oral With snacks   sevelamer carbonate  4,000 mg Oral TID with meals   sucralfate  1 g Oral TID WC & HS   topiramate  25 mg Oral BID   Continuous:  methocarbamol (ROBAXIN) IV      Assessment/Plan: 1) 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 2) Anemia.   The patient is stable.  He is feeling well.  There is a decline in his HGB.  It is unclear if this is from continued bleeding versus equilibration.  He is a Restaurant manager, fast food.  Plan: 1) Follow HGB over the weekend. 2) It was mentioned to him that he will benefit with a blood transfusion.  LOS: 3 days   [name] D 11/19/2020, 7:39 AM

## 2020-11-20 ENCOUNTER — Encounter (HOSPITAL_COMMUNITY): Payer: Self-pay | Admitting: Gastroenterology

## 2020-11-20 DIAGNOSIS — M1612 Unilateral primary osteoarthritis, left hip: Secondary | ICD-10-CM | POA: Diagnosis not present

## 2020-11-20 LAB — HEMOGLOBIN AND HEMATOCRIT, BLOOD
HCT: 23 % — ABNORMAL LOW (ref 39.0–52.0)
Hemoglobin: 7.2 g/dL — ABNORMAL LOW (ref 13.0–17.0)

## 2020-11-20 NOTE — Progress Notes (Signed)
PROGRESS NOTE FOR [facility]  Subjective: Feeling well.  No complaints.  Objective: Vital signs in last 24 hours: Temp:  [98.3 F (36.8 C)-98.8 F (37.1 C)] 98.8 F (37.1 C) (10/08 2015) Pulse Rate:  [75-93] 83 (10/09 0820) Resp:  [14-18] 18 (10/09 0820) BP: (111-122)/(76-80) 122/77 (10/09 0820) SpO2:  [100 %] 100 % (10/09 0820) Last BM Date: 11/19/20  Intake/Output from previous day: 10/08 0701 - 10/09 0700 In: 120 [P.O.:120] Out: -  Intake/Output this shift: Total I/O In: 240 [P.O.:240] Out: -   General appearance: alert and no distress GI: soft, non-tender; bowel sounds normal; no masses,  no organomegaly  Lab Results: Recent Labs    11/17/20 1345 11/18/20 0211 11/19/20 0100 11/20/20 0721  WBC 9.8 6.8  --   --   HGB 7.3* 6.7* 6.4* 7.2*  HCT 22.8* 20.9* 20.5* 23.0*  PLT 318 233  --   --    BMET Recent Labs    11/18/20 0800  NA 133*  K 4.4  CL 95*  CO2 24  GLUCOSE 80  BUN 43*  CREATININE 9.07*  CALCIUM 8.2*   LFT Recent Labs    11/18/20 0800  ALBUMIN 2.1*   PT/INR No results for input(s): LABPROT, INR in the last 72 hours. Hepatitis Panel No results for input(s): HEPBSAG, HCVAB, HEPAIGM, HEPBIGM in the last 72 hours. C-Diff No results for input(s): CDIFFTOX in the last 72 hours. Fecal Lactopherrin No results for input(s): FECLLACTOFRN in the last 72 hours.  Studies/Results: No results found.  Medications: Scheduled:  (feeding supplement) PROSource Plus  30 mL Oral BID BM   sodium chloride   Intravenous Once   aspirin  81 mg Oral BID   atorvastatin  10 mg Oral QPM   camphor-menthol   Topical BID   Chlorhexidine Gluconate Cloth  6 each Topical Q0600   cinacalcet  120 mg Oral Q supper   darbepoetin (ARANESP) injection - DIALYSIS  200 mcg Intravenous Q Wed-HD   docusate sodium  100 mg Oral BID   midodrine  10 mg Oral Q M,W,F   oxyCODONE  10 mg Oral Q12H   pantoprazole  40 mg Oral BID   polyethylene glycol  17 g Oral Daily   sevelamer  carbonate  1,600 mg Oral With snacks   sevelamer carbonate  4,000 mg Oral TID with meals   sucralfate  1 g Oral TID WC & HS   topiramate  25 mg Oral BID   Continuous:  methocarbamol (ROBAXIN) IV      Assessment/Plan: 1) Pyloric channel ulcer s/p BICAP. 2) Anemia - improved.   The patient is feeling well.  No complaints.  HGB improved from his transfusion the other day.  He does not report any transfusions yesterday.  Plan: 1) Continue PPI BID x 1 month and then QD. 2) Signing off. 3) Follow up with [facility].  LOS: 4 days   Papa Piercefield D 11/20/2020, 11:48 AM

## 2020-11-20 NOTE — Progress Notes (Signed)
Progress note  Carl Schewe Sr. ZOX:[id number] DOB: 01/18/1961  PCP: [name], MD  Admit date: 11/14/2020 Discharge date: 11/20/2020  Time spent: 35 minutes.    Recommendations for Outpatient Follow-up:   Follow-up with orthopedic surgery Follow-up with your primary care provider Keep your hemodialysis appointments. Take your medications as prescribed Continue PT OT with assistance and fall precautions.  Discharge Diagnoses:  Active Hospital Problems   Diagnosis Date Noted   Primary osteoarthritis of left hip 10/07/2020   Mucosal abnormality of esophagus    Mucosal abnormality of stomach    Mucosal abnormality of duodenum    Upper GI bleed    Left hip pain 11/16/2020   Type 2 diabetes mellitus with other specified complication ([place]) 81/19/1478   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/14/2020   ESRD on hemodialysis ([place]) 03/08/2020   Acute gastric ulcer with hemorrhage    Essential hypertension    Obstructive sleep apnea 02/08/2015   HLD (hyperlipidemia)    History of lacunar cerebrovascular accident (CVA) 05/23/2014   Anemia of renal disease 05/23/2014   GERD (gastroesophageal reflux disease) 12/09/2012    Resolved Hospital Problems  No resolved problems to display.    Discharge Condition: Stable  Diet recommendation: Resume previous diet  Vitals:   11/19/20 2015 11/20/20 0820  BP: 122/80 122/77  Pulse: 75 83  Resp: 18 18  Temp: 98.8 F (37.1 C)   SpO2: 100% 100%    History of present illness:  60-year with history of ESRD on HD Monday Wednesday Friday, HTN, DM2, TIA, lacunar CVA, GERD, anemia of chronic disease, HLD, OSA, osteoarthritis of the left hip admitted for hip replacement.  Medical team consulted for medication management. Care transferred to [facility] on 11/16/2020.  Hospital course complicated by symptomatic anemia with hemoglobin as low as 5.4 K with positive FOBT.  He was transfused 1 unit PRBC at hemodialysis on 11/16/2020, hemoglobin  corrected to 7.3.  Seen by GI, EGD showed pyloric channel ulcer with visible vessel status post BICAP.  Further drop in hemoglobin, down to 6.4 K.  GI recommends monitoring over the weekend.    Patient is Jehovah witness and states that he will take blood transfusion if his hemoglobin drops to the 5s as it did on 11/16/20 for which he received 1 unit PRBC at dialysis.  Next hemodialysis session, planned on Monday, 11/21/2020.   11/20/2020: Patient was seen and examined at his bedside.  He has no new complaints.  Hemoglobin improved this morning.  Per GI continue PPI twice daily x30 days and then daily.  Will need to follow-up with GI [place].  Hospital Course:  Principal Problem:   Primary osteoarthritis of left hip Active Problems:   GERD (gastroesophageal reflux disease)   History of lacunar cerebrovascular accident (CVA)   Anemia of renal disease   HLD (hyperlipidemia)   Essential hypertension   Acute gastric ulcer with hemorrhage   ESRD on hemodialysis ([place])   Obstructive sleep apnea   Type 2 diabetes mellitus with other specified complication (H[facility])   Status post total replacement of left hip   Left hip pain   Mucosal abnormality of esophagus   Mucosal abnormality of stomach   Mucosal abnormality of duodenum   Upper GI bleed  Post left total hip arthroplasty by orthopedic surgery Dr. Erlinda Hong on 11/15/2020 POD #5. Primary osteoarthritis of left hip Pain management and postop DVT prophylaxis per orthopedic surgery. PT recommended home health PT. Continue PT OT with assistance and fall precautions.  Acute blood loss anemia, positive FOBT, iron deficiency anemia, anemia of chronic disease. Transfuse 1 unit PRBC at hemodialysis on 11/16/2020 for hemoglobin of 5.4.  FOBT positive.  Seen by GI, post EGD on 11/18/2020.   Evidence of iron deficiency on iron studies. Hemoglobin 6.4 on 11/19/20. Hemoglobin up trended this morning 7.2.  Post EGD on 11/18/2020 by Dr. Candis Schatz:  Esophagitis  dissecans. Biopsied. - 3 cm hiatal hernia. - Enlarged gastric folds. Biopsied. - Non-bleeding gastric ulcer with adherent clot. Treated with bipolar cautery. This ulcer may have been more mechanical in etiology rather than from NSAIDs based on the location within the pylorus - Non-bleeding duodenal diverticulum. - Congested duodenal mucosa. Biopsied.  Impression: - Await pathology results. Per GI PPI twice daily x1 month then daily. - Use sucralfate suspension 1 gram PO QID for 2 weeks. - Repeat upper endoscopy in 8 weeks to check healing. - GI   Acute left ankle pain -Some soft tissue swelling.  No evidence of infection.  Uric acid levels are normal.  Continue to monitor, pain control as needed.   Management per orthopedic surgery.      ESRD on hemodialysis ([place]) -HD MWF.  Nephrology following. Completed HD on 11/16/2020 HD 11/18/2020 Next HD 11/21/2020.    GERD (gastroesophageal reflux disease) IV Protonix 40 mg twice daily due to concern for upper GI bleed. EGD 11/18/2020.     History of lacunar cerebrovascular accident (CVA) On aspirin 81 mg twice daily for DVT prophylaxis, will switch to subcu Lovenox if okay with surgery. Continue home Lipitor PT OT assessment, recommendation for home health PT     HLD (hyperlipidemia) - Continue home Lipitor     Essential hypertension Continue home midodrine MWF     Obstructive sleep apnea -Not on home CPAP.   Dry skin from ESRD Possible uremic pruritus Continue as needed p.o. Benadryl and Sarna lotion       Code Status: Full code     Code Status: Full code   Consultants: Nephrology Orthopedic surgery GI.   Procedures: HD on 11/16/2020. Plan for HD on 11/18/2020   Antimicrobials: None.   DVT prophylaxis: Aspirin 81 mg twice daily.    Discharge Exam: BP 122/77 (BP Location: Left Arm)   Pulse 83   Temp 98.8 F (37.1 C) (Oral)   Resp 18   Ht 6\' 1"  (1.854 m)   Wt 87.4 kg   SpO2 100%   BMI 25.42 kg/m   General: 60 y.o. year-old male well-developed well-nourished In no acute distress.  Alert and oriented x3. Cardiovascular: Regular rate and rhythm with no rubs or gallops. Respiratory: Clear to auscultation no wheezes or rales. Abdomen: Soft nontender normal bowel sounds present. Musculoskeletal: No lower extremity edema bilaterally.   Skin: No ulcerative lesions noted. Psychiatry: Mood is appropriate for condition and setting.  Discharge Instructions You were cared for by a hospitalist during your hospital stay. If you have any questions about your discharge medications or the care you received while you were in the hospital after you are discharged, you can call the unit and asked to speak with the hospitalist on call if the hospitalist that took care of you is not available. Once you are discharged, your primary care physician will handle any further medical issues. Please note that NO REFILLS for any discharge medications will be authorized once you are discharged, as it is imperative that you return to your primary care physician (or establish a relationship with a primary care physician if you  do not have one) for your aftercare needs so that they can reassess your need for medications and monitor your lab values.   Allergies as of 11/20/2020       Reactions   Infed [iron Dextran] Other (See Comments)   Decreased BP    Oxycodone Nausea Only   Vicodin [hydrocodone-acetaminophen] Other (See Comments)   Decreased BP   Hydrocodone-acetaminophen    Other reaction(s): lower BP   Diclofenac Other (See Comments)   Unknown        Medication List     STOP taking these medications    gabapentin 100 MG capsule Commonly known as: NEURONTIN   ibuprofen 800 MG tablet Commonly known as: ADVIL   omeprazole 20 MG capsule Commonly known as: PRILOSEC   QC TUMERIC COMPLEX PO       TAKE these medications    (feeding supplement) PROSource Plus liquid Take 30 mLs by mouth 2 (two)  times daily between meals for 7 days.   aspirin EC 81 MG tablet Take 1 tablet (81 mg total) by mouth 2 (two) times daily. To be taken after surgery What changed:  when to take this additional instructions   atorvastatin 10 MG tablet Commonly known as: LIPITOR Take 10 mg by mouth every evening.   camphor-menthol lotion Commonly known as: SARNA Apply topically 2 (two) times daily.   cephALEXin 500 MG capsule Commonly known as: Keflex Take 1 capsule (500 mg total) by mouth 4 (four) times daily. To be taken after surgery   cinacalcet 60 MG tablet Commonly known as: SENSIPAR Take 120 mg by mouth daily with supper.   cromolyn 4 % ophthalmic solution Commonly known as: OPTICROM Place 1 drop into both eyes daily as needed (redness).   DIALYVITE PO Take 1 tablet by mouth daily.   docusate sodium 100 MG capsule Commonly known as: Colace Take 1 capsule (100 mg total) by mouth daily as needed. What changed: when to take this   Lokelma 10 g Pack packet Generic drug: sodium zirconium cyclosilicate Take 1 packet by mouth daily.   methocarbamol 500 MG tablet Commonly known as: Robaxin Take 1 tablet (500 mg total) by mouth 2 (two) times daily as needed. To be taken after surgery   midodrine 10 MG tablet Commonly known as: PROAMATINE Take 10 mg by mouth See admin instructions. Take 10 mg by mouth three times a week as directed, bring to dialysis. May need an additional tablet during treatment.   ondansetron 4 MG tablet Commonly known as: Zofran Take 1 tablet (4 mg total) by mouth every 8 (eight) hours as needed for nausea or vomiting.   oxyCODONE-acetaminophen 5-325 MG tablet Commonly known as: Percocet Take 1-2 tablets by mouth every 6 (six) hours as needed. To be taken after surgery   pantoprazole 40 MG tablet Commonly known as: PROTONIX Take 1 tablet (40 mg total) by mouth 2 (two) times daily.   sevelamer carbonate 800 MG tablet Commonly known as: RENVELA Take  1,600-4,000 mg by mouth See admin instructions. Take 4000 mg by mouth three times daily with meals and 1600-2400 mg with snacks   sucralfate 1 GM/10ML suspension Commonly known as: CARAFATE Take 10 mLs (1 g total) by mouth 4 (four) times daily -  with meals and at bedtime.   topiramate 25 MG tablet Commonly known as: TOPAMAX Take 1 tablet (25 mg total) by mouth 2 (two) times daily.   vitamin E 180 MG (400 UNITS) capsule Take 400 Units by mouth daily.  Durable Medical Equipment  (From admission, onward)           Start     Ordered   11/14/20 2028  DME Walker rolling  Once       Question:  Patient needs a walker to treat with the following condition  Answer:  History of hip replacement   11/14/20 2027   11/14/20 2028  DME 3 n 1  Once        11/14/20 2027   11/14/20 2028  DME Bedside commode  Once       Question:  Patient needs a bedside commode to treat with the following condition  Answer:  History of hip replacement   11/14/20 2027           Allergies  Allergen Reactions   Infed [Iron Dextran] Other (See Comments)    Decreased BP    Oxycodone Nausea Only   Vicodin [Hydrocodone-Acetaminophen] Other (See Comments)    Decreased BP   Hydrocodone-Acetaminophen     Other reaction(s): lower BP   Diclofenac Other (See Comments)    Unknown    Follow-up Information     Leandrew Koyanagi, MD. Schedule an appointment as soon as possible for a visit in 1 week(s).   Specialty: Orthopedic Surgery Contact information: [place] [address] [phone]         [name], MD. Call today.   Specialty: Family Medicine Why: Please call for a posthospital follow-up appointment. Contact information: [place] [address] [phone]         Buford Dresser, MD .   Specialty: Cardiology Contact information: [address] [place] [address] [address], [place] Follow up.   Specialty: Home Health Services Why: [facility] health agency Contact information: [address] [place] [address] [phone]         [facility] Follow up.   Why: your medical equipment company Contact information: [address] [place] [address] [phone]                  The results of significant diagnostics from this hospitalization (including imaging, microbiology, ancillary and laboratory) are listed below for reference.    Significant Diagnostic Studies: DG Pelvis Portable  Result Date: 11/14/2020 CLINICAL DATA:  Status post left arthroplasty EXAM: PORTABLE PELVIS 1 VIEWS COMPARISON:  Films from earlier in the same day. FINDINGS: Pelvic ring is intact. Left hip replacement is now seen in satisfactory position. No acute bony or soft tissue abnormality is noted. Vascular calcifications are seen. IMPRESSION: Status post left hip replacement Electronically Signed   By: Inez Catalina M.D.   On: 11/14/2020 19:04   DG Ankle Left Port  Result Date: 11/14/2020 CLINICAL DATA:  Ankle pain EXAM: PORTABLE LEFT ANKLE - 2 VIEW COMPARISON:  None. FINDINGS: Soft tissue swelling is present. Age indeterminate avulsions at the tips of the medial and lateral malleoli. Ankle mortise is symmetric. Vascular calcifications. IMPRESSION: Soft tissue swelling. Age indeterminate avulsions off the tips of the medial and lateral malleoli Electronically Signed   By: Donavan Foil M.D.   On: 11/14/2020 19:42   DG C-Arm 1-60 Min-No Report  Result Date: 11/14/2020 Fluoroscopy was utilized by the requesting physician.  No radiographic interpretation.   DG C-Arm 1-60 Min-No Report  Result Date: 11/14/2020 Fluoroscopy was utilized by the requesting physician.  No radiographic  interpretation.   DG HIP OPERATIVE UNILAT WITH PELVIS LEFT  Result Date: 11/14/2020 CLINICAL DATA:  Left hip replacement EXAM: OPERATIVE LEFT HIP  WITH PELVIS COMPARISON:  05/19/2020 FLUOROSCOPY TIME:  Radiation Exposure Index (as provided by the fluoroscopic device): 1.98 mGy If the device does not provide the exposure index: Fluoroscopy Time:  18 seconds Number of Acquired Images:  4 FINDINGS: Initial images demonstrate significant destruction of the left femoral head progressed in the interval from the prior exam. Subsequent images demonstrate removal of the femoral neck and femoral head with placement of left hip prosthesis. No acute bony or soft tissue abnormality is seen. Lucent lesion is again noted in the region of the right femoral neck and head. IMPRESSION: Left hip arthroplasty Electronically Signed   By: Inez Catalina M.D.   On: 11/14/2020 19:05    Microbiology: No results found for this or any previous visit (from the past 240 hour(s)).    Labs: Basic Metabolic Panel: Recent Labs  Lab 11/14/20 1431 11/15/20 0033 11/16/20 0850 11/18/20 0800  NA 137 135 132* 133*  K 3.8 4.8 4.5 4.4  CL 98 97* 94* 95*  CO2  --  25 22 24   GLUCOSE 70 103* 88 80  BUN 29* 34* 61* 43*  CREATININE 7.50* 7.64* 9.55* 9.07*  CALCIUM  --  8.0* 7.7* 8.2*  PHOS  --   --  4.6 4.2   Liver Function Tests: Recent Labs  Lab 11/16/20 0850 11/18/20 0800  ALBUMIN 2.2* 2.1*   No results for input(s): LIPASE, AMYLASE in the last 168 hours. No results for input(s): AMMONIA in the last 168 hours. CBC: Recent Labs  Lab 11/15/20 0033 11/16/20 0850 11/16/20 2118 11/17/20 1345 11/18/20 0211 11/19/20 0100 11/20/20 0721  WBC 12.4* 9.7 10.1 9.8 6.8  --   --   NEUTROABS 11.3*  --   --   --   --   --   --   HGB 7.0* 5.4* 7.4* 7.3* 6.7* 6.4* 7.2*  HCT 22.6* 17.3* 23.3* 22.8* 20.9* 20.5* 23.0*  MCV 88.3 86.9 87.3 88.4 87.1  --   --   PLT 260 217 259 318 233  --   --    Cardiac Enzymes: No results for input(s): CKTOTAL, CKMB, CKMBINDEX, TROPONINI in the last 168 hours. BNP: BNP (last 3 results) No results for input(s): BNP in the last 8760  hours.  ProBNP (last 3 results) No results for input(s): PROBNP in the last 8760 hours.  CBG: No results for input(s): GLUCAP in the last 168 hours.     Signed:  Kayleen Memos, MD [facility] 11/20/2020, 1:21 PM

## 2020-11-20 NOTE — Plan of Care (Signed)
  Problem: Clinical Measurements: Goal: Will remain free from infection Outcome: Progressing   Problem: Elimination: Goal: Will not experience complications related to bowel motility Outcome: Progressing   Problem: Pain Managment: Goal: General experience of comfort will improve Outcome: Progressing   Problem: Safety: Goal: Ability to remain free from injury will improve Outcome: Progressing

## 2020-11-20 NOTE — Progress Notes (Signed)
   Subjective: 2 Days Post-Op Procedure(s) (LRB): ESOPHAGOGASTRODUODENOSCOPY (EGD) (N/A) HOT HEMOSTASIS (ARGON PLASMA COAGULATION/BICAP) BIOPSY Patient reports pain as mild.    Objective: Vital signs in last 24 hours: Temp:  [98.3 F (36.8 C)-98.8 F (37.1 C)] 98.8 F (37.1 C) (10/08 2015) Pulse Rate:  [75-93] 83 (10/09 0820) Resp:  [14-18] 18 (10/09 0820) BP: (111-122)/(76-80) 122/77 (10/09 0820) SpO2:  [100 %] 100 % (10/09 0820)  Intake/Output from previous day: 10/08 0701 - 10/09 0700 In: 120 [P.O.:120] Out: -  Intake/Output this shift: Total I/O In: 240 [P.O.:240] Out: -   Recent Labs    11/17/20 1345 11/18/20 0211 11/19/20 0100 11/20/20 0721  HGB 7.3* 6.7* 6.4* 7.2*   Recent Labs    11/17/20 1345 11/18/20 0211 11/19/20 0100 11/20/20 0721  WBC 9.8 6.8  --   --   RBC 2.58* 2.40*  --   --   HCT 22.8* 20.9* 20.5* 23.0*  PLT 318 233  --   --    Recent Labs    11/18/20 0800  NA 133*  K 4.4  CL 95*  CO2 24  BUN 43*  CREATININE 9.07*  GLUCOSE 80  CALCIUM 8.2*   No results for input(s): LABPT, INR in the last 72 hours.  Neurologically intact No results found.  Assessment/Plan: 2 Days Post-Op Procedure(s) (LRB): ESOPHAGOGASTRODUODENOSCOPY (EGD) (N/A) HOT HEMOSTASIS (ARGON PLASMA COAGULATION/BICAP) BIOPSY Up with therapy, Hgb stable. Currently.   Marybelle Killings 11/20/2020, 9:23 AM

## 2020-11-20 NOTE — Progress Notes (Signed)
[facility] Progress Note   Subjective: Seen in room, sitting up at bedside watching church. No C/Os. HD tomorrow on schedule.   Objective Vitals:   11/19/20 0736 11/19/20 1432 11/19/20 2015 11/20/20 0820  BP: 107/69 111/76 122/80 122/77  Pulse: 85 93 75 83  Resp: 17 14 18 18   Temp: 98.7 F (37.1 C) 98.3 F (36.8 C) 98.8 F (37.1 C)   TempSrc: Oral Oral Oral   SpO2: 99% 100% 100% 100%  Weight:      Height:       Physical Exam General: Pleasant older male in NAD Heart: S1,S2 RRR No M/R/G Lungs: CTAB A/P Abdomen: Soft, NABS Extremities: No edema. Wound Vac L hip Dialysis Access: R AVF aneurysmal +T/B    Additional Objective Labs: Basic Metabolic Panel: Recent Labs  Lab 11/15/20 0033 11/16/20 0850 11/18/20 0800  NA 135 132* 133*  K 4.8 4.5 4.4  CL 97* 94* 95*  CO2 25 22 24   GLUCOSE 103* 88 80  BUN 34* 61* 43*  CREATININE 7.64* 9.55* 9.07*  CALCIUM 8.0* 7.7* 8.2*  PHOS  --  4.6 4.2   Liver Function Tests: Recent Labs  Lab 11/16/20 0850 11/18/20 0800  ALBUMIN 2.2* 2.1*   No results for input(s): LIPASE, AMYLASE in the last 168 hours. CBC: Recent Labs  Lab 11/15/20 0033 11/16/20 0850 11/16/20 2118 11/17/20 1345 11/18/20 0211 11/19/20 0100 11/20/20 0721  WBC 12.4* 9.7 10.1 9.8 6.8  --   --   NEUTROABS 11.3*  --   --   --   --   --   --   HGB 7.0* 5.4* 7.4* 7.3* 6.7* 6.4* 7.2*  HCT 22.6* 17.3* 23.3* 22.8* 20.9* 20.5* 23.0*  MCV 88.3 86.9 87.3 88.4 87.1  --   --   PLT 260 217 259 318 233  --   --    Blood Culture    Component Value Date/Time   SDES BLOOD LEFT HAND 08/01/2015 0624   SPECREQUEST BOTTLES DRAWN AEROBIC AND ANAEROBIC  5CC 08/01/2015 0624   CULT NO GROWTH 5 DAYS 08/01/2015 0624   REPTSTATUS 08/06/2015 FINAL 08/01/2015 0624    Cardiac Enzymes: No results for input(s): CKTOTAL, CKMB, CKMBINDEX, TROPONINI in the last 168 hours. CBG: No results for input(s): GLUCAP in the last 168 hours. Iron Studies: No results for  input(s): IRON, TIBC, TRANSFERRIN, FERRITIN in the last 72 hours. @lablastinr3 @ Studies/Results: No results found. Medications:  methocarbamol (ROBAXIN) IV      (feeding supplement) PROSource Plus  30 mL Oral BID BM   sodium chloride   Intravenous Once   aspirin  81 mg Oral BID   atorvastatin  10 mg Oral QPM   camphor-menthol   Topical BID   Chlorhexidine Gluconate Cloth  6 each Topical Q0600   cinacalcet  120 mg Oral Q supper   darbepoetin (ARANESP) injection - DIALYSIS  200 mcg Intravenous Q Wed-HD   docusate sodium  100 mg Oral BID   midodrine  10 mg Oral Q M,W,F   oxyCODONE  10 mg Oral Q12H   pantoprazole  40 mg Oral BID   polyethylene glycol  17 g Oral Daily   sevelamer carbonate  1,600 mg Oral With snacks   sevelamer carbonate  4,000 mg Oral TID with meals   sucralfate  1 g Oral TID WC & HS   topiramate  25 mg Oral BID     Dialysis Orders: MWF at [facility], 400/500, EDW 88kg, 2K/2.5Ca, AVF - heparin  2000 units IV TIW - Mircera 157mcg IV q 2 weeks (last 9/21 for Hgb 7.7)   Assessment/Plan:  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  ESRD:  Continue HD per MWF schedule - Next HD 11/21/2020  BP/volume: BP controlled, no edema on exam. Uses midodrine for HD.  Anemia (ESRD + GIB): Hgb down to 5.4, agreed for 1U PRBCs, given on 10/5. FOBT positive. S/p Aranesp 259mcg on 10/4. Tsat low - allergy to IV iron listed in chart. Hgb trending down again. GI consulted. 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 Patient is Jehovah Witness but has agreed to transfusion if needed.   Metabolic bone disease: Ca/Phos good - continue home binders/sensipar. Nutrition: Alb low, adding supplements.  T2DM   Hx CVA  Carl Crossland H. Alika Saladin NP-C 11/20/2020, 10:41 AM  [employer] [phone]

## 2020-11-20 NOTE — Plan of Care (Signed)

## 2020-11-21 DIAGNOSIS — M1612 Unilateral primary osteoarthritis, left hip: Secondary | ICD-10-CM | POA: Diagnosis not present

## 2020-11-21 LAB — RENAL FUNCTION PANEL
Albumin: 1.9 g/dL — ABNORMAL LOW (ref 3.5–5.0)
Anion gap: 13 (ref 5–15)
BUN: 52 mg/dL — ABNORMAL HIGH (ref 8–23)
CO2: 24 mmol/L (ref 22–32)
Calcium: 8.2 mg/dL — ABNORMAL LOW (ref 8.9–10.3)
Chloride: 94 mmol/L — ABNORMAL LOW (ref 98–111)
Creatinine, Ser: 10 mg/dL — ABNORMAL HIGH (ref 0.61–1.24)
GFR, Estimated: 5 mL/min — ABNORMAL LOW (ref >60)
Glucose, Bld: 90 mg/dL (ref 70–99)
Phosphorus: 4.1 mg/dL (ref 2.5–4.6)
Potassium: 4.7 mmol/L (ref 3.5–5.1)
Sodium: 131 mmol/L — ABNORMAL LOW (ref 135–145)

## 2020-11-21 LAB — HEMOGLOBIN AND HEMATOCRIT, BLOOD
HCT: 26.6 % — ABNORMAL LOW (ref 39.0–52.0)
Hemoglobin: 8.4 g/dL — ABNORMAL LOW (ref 13.0–17.0)

## 2020-11-21 LAB — CBC
HCT: 20.1 % — ABNORMAL LOW (ref 39.0–52.0)
Hemoglobin: 6.3 g/dL — CL (ref 13.0–17.0)
MCH: 27.8 pg (ref 26.0–34.0)
MCHC: 31.3 g/dL (ref 30.0–36.0)
MCV: 88.5 fL (ref 80.0–100.0)
Platelets: 329 10*3/uL (ref 150–400)
RBC: 2.27 MIL/uL — ABNORMAL LOW (ref 4.22–5.81)
RDW: 16.2 % — ABNORMAL HIGH (ref 11.5–15.5)
WBC: 7.4 10*3/uL (ref 4.0–10.5)
nRBC: 0 % (ref 0.0–0.2)

## 2020-11-21 LAB — PREPARE RBC (CROSSMATCH)

## 2020-11-21 MED ORDER — ENOXAPARIN SODIUM 40 MG/0.4ML IJ SOSY: 40.0000 mg | PREFILLED_SYRINGE | INTRAMUSCULAR | 0 refills | Status: DC

## 2020-11-21 MED ORDER — HEPARIN SODIUM (PORCINE) 1000 UNIT/ML DIALYSIS
20.0000 [IU]/kg | INTRAMUSCULAR | Status: DC | PRN
  Administered 2020-11-21: 1700 [IU] via INTRAVENOUS_CENTRAL
  Filled 2020-11-21 (×3): qty 2

## 2020-11-21 MED ORDER — SODIUM CHLORIDE 0.9 % IV SOLN: 100.0000 mL | INTRAVENOUS | Status: DC | PRN

## 2020-11-21 MED ORDER — ASPIRIN 81 MG PO CHEW: 81.0000 mg | CHEWABLE_TABLET | Freq: Two times a day (BID) | ORAL | 0 refills | Status: DC

## 2020-11-21 MED ORDER — PENTAFLUOROPROP-TETRAFLUOROETH EX AERO: 1.0000 "application " | INHALATION_SPRAY | CUTANEOUS | Status: DC | PRN

## 2020-11-21 MED ORDER — SUCRALFATE 1 GM/10ML PO SUSP: 1.0000 g | Freq: Four times a day (QID) | ORAL | 0 refills | Status: DC

## 2020-11-21 MED ORDER — SODIUM CHLORIDE 0.9% IV SOLUTION: Freq: Once | INTRAVENOUS | Status: DC

## 2020-11-21 MED ORDER — LIDOCAINE HCL (PF) 1 % IJ SOLN
5.0000 mL | INTRAMUSCULAR | Status: DC | PRN
  Filled 2020-11-21: qty 5

## 2020-11-21 NOTE — Anesthesia Postprocedure Evaluation (Signed)
Anesthesia Post Note  Patient: Carl Fotheringham Sr.  Procedure(s) Performed: ESOPHAGOGASTRODUODENOSCOPY (EGD) HOT HEMOSTASIS (ARGON PLASMA COAGULATION/BICAP) BIOPSY     Patient location during evaluation: Endoscopy Anesthesia Type: MAC Level of consciousness: awake and alert Pain management: pain level controlled Vital Signs Assessment: post-procedure vital signs reviewed and stable Respiratory status: spontaneous breathing, nonlabored ventilation, respiratory function stable and patient connected to nasal cannula oxygen Cardiovascular status: stable and blood pressure returned to baseline Postop Assessment: no apparent nausea or vomiting Anesthetic complications: no   No notable events documented.  Last Vitals:  Vitals:   11/21/20 1234 11/21/20 1322  BP: 140/76 134/75  Pulse: 78 95  Resp: 16 16  Temp: 36.4 C 37.1 C  SpO2: 100% 100%    Last Pain:  Vitals:   11/21/20 1322  TempSrc: Oral  PainSc: 0-No pain                 Belenda Cruise P Shiva Sahagian

## 2020-11-21 NOTE — Progress Notes (Signed)
Patient HGB this Am is 6.3. Provider on-call notified. Will continue to monitor.

## 2020-11-21 NOTE — Progress Notes (Signed)
High venous pressures noted. Needles flipped. Attempt to restart treatment. Unable to restart treatment clotting noted in venous chamber. Patient disconnected machine restrung.

## 2020-11-21 NOTE — Progress Notes (Signed)
Patient and Dr. Candiss Norse notified of line clotting and blood loss.

## 2020-11-21 NOTE — Progress Notes (Signed)
PT Cancellation Note  Patient Details Name: Carl Lepore Sr. MRN: [id number] DOB: March 03, 1960   Cancelled Treatment:    Reason Eval/Treat Not Completed: Patient at procedure or test/unavailable (Off floor for HD this AM.)  Braxtin Bamba A. Skyann Ganim, PT, DPT Acute Rehabilitation Services Office: [place] 11/21/2020, 12:11 PM

## 2020-11-21 NOTE — Progress Notes (Signed)
OT Cancellation Note  Patient Details Name: Carl Gibbons Sr. MRN: [id number] DOB: May 21, 1960   Cancelled Treatment:    Reason Eval/Treat Not Completed: Patient at procedure or test/ unavailable. Pt unavailable, working with CM to appeal d/c. Will follow up as able.  Helane Gunther, OT/L  Acute Rehab [place] 11/21/2020, 3:51 PM

## 2020-11-21 NOTE — Progress Notes (Signed)
[facility] Progress Note   Subjective: Seen on HD, tolerating treatment. Venous chamber clotted, restarted. Hgb 6.3 overnight, 1u prbc ordered just now.  Objective Vitals:   11/21/20 0800 11/21/20 0830 11/21/20 0900 11/21/20 0930  BP: 116/69 100/68 130/73 138/79  Pulse: 76 81 77 77  Resp:    15  Temp:      TempSrc:      SpO2:    100%  Weight:      Height:       Physical Exam General: NAD Heart: S1,S2 RRR No M/R/G Lungs: CTAB A/P Abdomen: Soft, NABS Extremities: No edema. Wound Vac L hip Neuro: awake, alert Dialysis Access: R AVF aneurysmal +T/B   Additional Objective Labs: Basic Metabolic Panel: Recent Labs  Lab 11/16/20 0850 11/18/20 0800 11/21/20 0153  NA 132* 133* 131*  K 4.5 4.4 4.7  CL 94* 95* 94*  CO2 22 24 24   GLUCOSE 88 80 90  BUN 61* 43* 52*  CREATININE 9.55* 9.07* 10.00*  CALCIUM 7.7* 8.2* 8.2*  PHOS 4.6 4.2 4.1   Liver Function Tests: Recent Labs  Lab 11/16/20 0850 11/18/20 0800 11/21/20 0153  ALBUMIN 2.2* 2.1* 1.9*   No results for input(s): LIPASE, AMYLASE in the last 168 hours. CBC: Recent Labs  Lab 11/15/20 0033 11/16/20 0850 11/16/20 2118 11/17/20 1345 11/18/20 0211 11/19/20 0100 11/20/20 0721 11/21/20 0153  WBC 12.4* 9.7 10.1 9.8 6.8  --   --  7.4  NEUTROABS 11.3*  --   --   --   --   --   --   --   HGB 7.0* 5.4* 7.4* 7.3* 6.7* 6.4* 7.2* 6.3*  HCT 22.6* 17.3* 23.3* 22.8* 20.9* 20.5* 23.0* 20.1*  MCV 88.3 86.9 87.3 88.4 87.1  --   --  88.5  PLT 260 217 259 318 233  --   --  329   Blood Culture    Component Value Date/Time   SDES BLOOD LEFT HAND 08/01/2015 0624   SPECREQUEST BOTTLES DRAWN AEROBIC AND ANAEROBIC  5CC 08/01/2015 0624   CULT NO GROWTH 5 DAYS 08/01/2015 0624   REPTSTATUS 08/06/2015 FINAL 08/01/2015 0624    Cardiac Enzymes: No results for input(s): CKTOTAL, CKMB, CKMBINDEX, TROPONINI in the last 168 hours. CBG: No results for input(s): GLUCAP in the last 168 hours. Iron Studies: No results  for input(s): IRON, TIBC, TRANSFERRIN, FERRITIN in the last 72 hours. @lablastinr3 @ Studies/Results: No results found. Medications:  sodium chloride     sodium chloride     methocarbamol (ROBAXIN) IV      (feeding supplement) PROSource Plus  30 mL Oral BID BM   sodium chloride   Intravenous Once   aspirin  81 mg Oral BID   atorvastatin  10 mg Oral QPM   camphor-menthol   Topical BID   Chlorhexidine Gluconate Cloth  6 each Topical Q0600   cinacalcet  120 mg Oral Q supper   darbepoetin (ARANESP) injection - DIALYSIS  200 mcg Intravenous Q Wed-HD   docusate sodium  100 mg Oral BID   midodrine  10 mg Oral Q M,W,F   oxyCODONE  10 mg Oral Q12H   pantoprazole  40 mg Oral BID   polyethylene glycol  17 g Oral Daily   sevelamer carbonate  1,600 mg Oral With snacks   sevelamer carbonate  4,000 mg Oral TID with meals   sucralfate  1 g Oral TID WC & HS   topiramate  25 mg Oral BID  Dialysis Orders: MWF at [facility], 400/500, EDW 88kg, 2K/2.5Ca, AVF - heparin 2000 units IV TIW - Mircera 110mcg IV q 2 weeks (last 9/21 for Hgb 7.7)   Assessment/Plan:  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  ESRD:  Continue HD per MWF schedule - Next HD 11/23/2020  BP/volume: BP controlled, no edema on exam. Uses midodrine for HD.  Anemia (ESRD + GIB): Hgb down to 5.4, agreed for 1U PRBCs, given on 10/5. FOBT positive. S/p Aranesp 212mcg on 10/4. Tsat low - allergy to IV iron listed in chart. Hgb trending down again. GI consulted. 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 Patient is Je[employer] Witness but has agreed to transfusion if needed. Hgb down to 6.3 today, ordering 1u prbc today, discussed with patient and he agrees.  Metabolic bone disease: Ca/Phos good - continue home binders/sensipar. Nutrition: Alb low, push protein.  T2DM-per primary   Hx CVA  Gean Quint, MD [facility]

## 2020-11-21 NOTE — Care Management (Addendum)
Confirmed with Erin with [facility] patient arranged for HHPT. 3in 1 for home at bedside. Patient does not feel that he is stable for discharge.   NCM provided IM , instructions on how to appeal his discharge. NCM reviewed detailed notice of discharge and HINN 12 with patient. Patient wants to call [facility] Medicare prior to deciding if he will appeal or not. NCM will check back at 4 pm.    Dr Nevada Crane discussed with Renal and Dr Algis Liming , it was decided to keep patient tonight and recheck Hgb in the morning if Hgb stable tomorrow morning , he will be discharged tomorrow. Patient aware and voiced understanding

## 2020-11-21 NOTE — Progress Notes (Signed)
Physical Therapy Treatment Note (Late entry for session done on 10/9)  Clinical Impression: Continuing work on functional mobility and activity tolerance;  Able to walk the hallways with close monitor for symptoms of syncope; Tending to walk with hips flexed, will correct with cues, but returns to hips slightly flexed, especially with fatigue; Excellent progress    11/20/20 1500  PT Visit Information  Last PT Received On 11/20/20  Assistance Needed +1  History of Present Illness Patient is a 60 y/o male who presents s/p left THA 11/14/20 direct anterior approach. PMH includes TIA, ESRD, DM, HTN, Rt TSA, depression, anxiety, stroke, seizures.  Subjective Data  Patient Stated Goal go home when ready  Precautions  Precautions Fall  Precaution Comments wound vac  Restrictions  LLE Weight Bearing WBAT  Pain Assessment  Pain Assessment 0-10  Pain Score 6  Pain Location L hip  Pain Descriptors / Indicators Aching;Constant;Discomfort;Tender;Tightness  Pain Intervention(s) Monitored during session  Cognition  Arousal/Alertness Awake/alert  Behavior During Therapy WFL for tasks assessed/performed;Impulsive  Overall Cognitive Status Within Functional Limits for tasks assessed  General Comments Pt up and walking in room when PT arrieved  Transfers  Overall transfer level Needs assistance  Equipment used Rolling walker (2 wheeled)  Transfers Sit to/from Stand  Sit to Stand Min guard  General transfer comment Met pt in room; reports little difficulty with sit to stand; Cues to control descent to sit; good use of arm support on armrests overall, but less control of descent with the last few inches to the seat  Ambulation/Gait  Ambulation/Gait assistance Min guard;Supervision  Gait Distance (Feet) 120 Feet  Assistive device Rolling walker (2 wheeled)  Gait Pattern/deviations Trunk flexed  General Gait Details Pt on board for the goal od walking in the hallways to the window at the end of the  hall; Hgb improved from yesterday, but still low, so opted to keep reclienr near during amb; tends to keep L hip flexed throughout gait cycle, with incr bil hip flexion in L single limb stance while advancing RLE; cues to stand tall on LLE during loading response, and activate gluteals for hip extension in stance and more upright posture; some improvement with cues  Balance  Sitting balance-Leahy Scale Good  Standing balance-Leahy Scale Fair  General Comments  General comments (skin integrity, edema, etc.) Pt tends to get up on his own; RN notified; he puts VAC on the front of his RW -- which leads to concern for it tipping the RW frontwards, but it didn't tip during today's walk  PT - End of Session  Equipment Utilized During Treatment Gait belt  Activity Tolerance Patient tolerated treatment well  Patient left in chair;with call bell/phone within reach;Other (comment) (Discussed pt status with RN; chair alarm is placed and ready to be turned on -- but it also may just lead to pt frustration and suboptimal experience)  Nurse Communication Mobility status (and considerations re: his improvement, weighed against fall risk (which is lower, but not zero))   PT - Assessment/Plan  PT Plan Current plan remains appropriate  PT Visit Diagnosis Pain;Muscle weakness (generalized) (M62.81);Difficulty in walking, not elsewhere classified (R26.2);History of falling (Z91.81)  Pain - Right/Left Left  Pain - part of body Hip;Knee  PT Frequency (ACUTE ONLY) 7X/week  Follow Up Recommendations Follow surgeon's recommendation for DC plan and follow-up therapies;Supervision for mobility/OOB (Pt. is mobilizing well today, if surgeon agrees, could transition home with [facility] PT)  PT equipment Rolling walker with 5" wheels (delivered  to room)  AM-PAC PT "6 Clicks" Mobility Outcome Measure (Version 2)  Help needed turning from your back to your side while in a flat bed without using bedrails? 4  Help needed moving from  lying on your back to sitting on the side of a flat bed without using bedrails? 4  Help needed moving to and from a bed to a chair (including a wheelchair)? 4  Help needed standing up from a chair using your arms (e.g., wheelchair or bedside chair)? 3  Help needed to walk in hospital room? 3  Help needed climbing 3-5 steps with a railing?  3  6 Click Score 21  Consider Recommendation of Discharge To: Home with no services  Progressive Mobility  What is the highest level of mobility based on the progressive mobility assessment? Level 5 (Walks with assist in room/hall) - Balance while stepping forward/back and can walk in room with assist - Complete  Mobility Ambulated with assistance in hallway  PT Goal Progression  Progress towards PT goals Progressing toward goals  Acute Rehab PT Goals  PT Goal Formulation With patient  Time For Goal Achievement 11/29/20  Potential to Achieve Goals Good  PT Time Calculation  PT Start Time (ACUTE ONLY) 1200  PT Stop Time (ACUTE ONLY) 1230  PT Time Calculation (min) (ACUTE ONLY) 30 min  PT General Charges  $$ ACUTE PT VISIT 1 Visit  PT Treatments  $Gait Training 23-37 mins   Roney Marion, [place]  Acute Rehabilitation Services Pager [phone] Office [phone]

## 2020-11-21 NOTE — Progress Notes (Signed)
Treatment resumed.

## 2020-11-21 NOTE — Discharge Summary (Addendum)
Progress note  Carl Fertig Sr. LKG:[id number] DOB: 1960/08/09  PCP: [name], MD  Admit date: 11/14/2020 Discharge date: 11/21/2020  Time spent: 35 minutes.    Recommendations for Outpatient Follow-up:  Follow-up with orthopedic surgery Follow-up with your primary care provider Keep your hemodialysis appointments. Take your medications as prescribed Continue PT OT with assistance and fall precautions.  Discharge Diagnoses:  Active Hospital Problems   Diagnosis Date Noted   Primary osteoarthritis of left hip 10/07/2020   Mucosal abnormality of esophagus    Mucosal abnormality of stomach    Mucosal abnormality of duodenum    Upper GI bleed    Left hip pain 11/16/2020   Type 2 diabetes mellitus with other specified complication ([place]) 66/44/0347   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/14/2020   ESRD on hemodialysis ([place]) 03/08/2020   Acute gastric ulcer with hemorrhage    Essential hypertension    Obstructive sleep apnea 02/08/2015   HLD (hyperlipidemia)    History of lacunar cerebrovascular accident (CVA) 05/23/2014   Anemia of renal disease 05/23/2014   GERD (gastroesophageal reflux disease) 12/09/2012    Resolved Hospital Problems  No resolved problems to display.    Discharge Condition: Stable  Diet recommendation: Resume previous diet, renal dialysis diet.  Vitals:   11/21/20 1234 11/21/20 1322  BP: 140/76 134/75  Pulse: 78 95  Resp: 16 16  Temp: 97.6 F (36.4 C) 98.8 F (37.1 C)  SpO2: 100% 100%    History of present illness:  60-year with history of ESRD on HD Monday Wednesday Friday, HTN, DM2, TIA, lacunar CVA, GERD, anemia of chronic disease, HLD, OSA, osteoarthritis of the left hip admitted for left hip replacement.  Medical team initially consulted for medication management. Care transferred to [facility] on 11/16/2020.  Hospital course complicated by symptomatic anemia with hemoglobin as low as 5.4 K with positive FOBT.  He was transfused 1  unit PRBC at hemodialysis on 11/16/2020, hemoglobin corrected to 7.3 K.  Seen by GI, EGD performed on 11/18/2020 by [facility], Dr. Candis Schatz showed pyloric channel ulcer with visible vessel status post BICAP.  He had another drop in hemoglobin 6.3 K on 11/21/2020 for which he was transfused 1 unit PRBC at hemodialysis.  Per GI, Dr. Lorenso Courier (on 11/21/2020), okay to continue aspirin 81 mg twice daily and continue PPI twice daily x30 days and then daily.  Will need to follow-up with GI  posthospitalization.   11/21/2020: Patient received 1 unit PRBC at hemodialysis with consent.  Patient is Jehovah's Witness.  Next hemodialysis 11/23/2020.  Okay to discharge from orthopedics standpoint.  GI signed off on 11/20/2020.  Patient will need to follow-up with orthopedic surgery and GI posthospitalization.   Hospital Course:  Principal Problem:   Primary osteoarthritis of left hip Active Problems:   GERD (gastroesophageal reflux disease)   History of lacunar cerebrovascular accident (CVA)   Anemia of renal disease   HLD (hyperlipidemia)   Essential hypertension   Acute gastric ulcer with hemorrhage   ESRD on hemodialysis ([place])   Obstructive sleep apnea   Type 2 diabetes mellitus with other specified complication ([facility])   Status post total replacement of left hip   Left hip pain   Mucosal abnormality of esophagus   Mucosal abnormality of stomach   Mucosal abnormality of duodenum   Upper GI bleed  Post left total hip arthroplasty by orthopedic surgery Dr. Erlinda Hong on 11/14/2020 POD #7. Primary osteoarthritis of left hip Pain management and postop DVT prophylaxis per  orthopedic surgery.  Recommended aspirin 81 mg twice daily x30 days. PT recommended home health PT. Continue PT OT with assistance and fall precautions.     Acute blood loss anemia, positive FOBT, iron deficiency anemia, anemia of chronic disease on hemodialysis. Patient has received total of 2 units PRBCs transfusion during this  admission.  Received 1 unit PRBC on 11/16/2020 for hemoglobin of 5.4K and received 1 unit PRBC on 11/21/2020 for hemoglobin of 6.3K. Advised to follow-up with GI outpatient.  Post EGD on 11/18/2020 by Dr. Candis Schatz:  Esophagitis dissecans. Biopsied. - 3 cm hiatal hernia. - Enlarged gastric folds. Biopsied. - Non-bleeding gastric ulcer with adherent clot. Treated with bipolar cautery. This ulcer may have been more mechanical in etiology rather than from NSAIDs based on the location within the pylorus - Non-bleeding duodenal diverticulum. - Congested duodenal mucosa. Biopsied.  Impression: - PPI twice daily x1 month then daily. - Use sucralfate suspension 1 gram PO QID for 2 weeks. - Repeat upper endoscopy in 8 weeks to check healing. - GI   Acute left ankle pain No evidence of infection.   Uric acid levels are normal.  Follow-up with orthopedic surgery.    ESRD on hemodialysis MWF  Completed HD on 11/16/2020, 11/18/2020, and on 11/21/2020.   Esophagitis dissecans/nonbleeding gastric ulcer with adherent clot/nonbleeding duodenal diverticulum/congested duodenal mucosa/GERD (gastroesophageal reflux disease) Received IV Protonix 40 mg twice daily due to concern for upper GI bleed prior to EGD on EGD 11/18/2020. GI recommended p.o. Protonix 40 mg twice daily x30 days, then daily.  Sucralfate suspension 1 g p.o. 4 times daily x14 days. Follow-up with GI outpatient.     History of lacunar cerebrovascular accident (CVA) On aspirin 81 mg twice daily for DVT prophylaxis Continue home Lipitor PT OT assessment, recommendation for home health PT     HLD (hyperlipidemia) - Continue home Lipitor     Essential hypertension Continue home midodrine MWF     Obstructive sleep apnea -Not on home CPAP.   Dry skin from ESRD Continue as needed p.o. Benadryl and Sarna lotion       Code Status: Full code     Code Status: Full code   Consultants: Nephrology Orthopedic surgery GI.    Procedures: HD on 11/16/2020, 11/18/2020, 11/21/2020.   Antimicrobials: None.   DVT prophylaxis: Aspirin 81 mg twice daily.    Discharge Exam: BP 134/75 (BP Location: Left Arm)   Pulse 95   Temp 98.8 F (37.1 C) (Oral)   Resp 16   Ht 6\' 1"  (1.854 m)   Wt 92.2 kg   SpO2 100%   BMI 26.82 kg/m  General: 60 y.o. year-old male well-developed well-nourished In no acute distress.  Alert and oriented x3. Cardiovascular: Regular rate and rhythm with no rubs or gallops. Respiratory: Clear to auscultation no wheezes or rales. Abdomen: Soft nontender normal bowel sounds present. Musculoskeletal: No lower extremity edema bilaterally.   Skin: No ulcerative lesions noted. Psychiatry: Mood is appropriate for condition and setting.  Discharge Instructions You were cared for by a hospitalist during your hospital stay. If you have any questions about your discharge medications or the care you received while you were in the hospital after you are discharged, you can call the unit and asked to speak with the hospitalist on call if the hospitalist that took care of you is not available. Once you are discharged, your primary care physician will handle any further medical issues. Please note that NO REFILLS for any discharge medications will  be authorized once you are discharged, as it is imperative that you return to your primary care physician (or establish a relationship with a primary care physician if you do not have one) for your aftercare needs so that they can reassess your need for medications and monitor your lab values.   Allergies as of 11/21/2020       Reactions   Infed [iron Dextran] Other (See Comments)   Decreased BP    Oxycodone Nausea Only   Vicodin [hydrocodone-acetaminophen] Other (See Comments)   Decreased BP   Hydrocodone-acetaminophen    Other reaction(s): lower BP   Diclofenac Other (See Comments)   Unknown        Medication List     STOP taking these medications     aspirin EC 81 MG tablet Replaced by: aspirin 81 MG chewable tablet   gabapentin 100 MG capsule Commonly known as: NEURONTIN   ibuprofen 800 MG tablet Commonly known as: ADVIL   omeprazole 20 MG capsule Commonly known as: PRILOSEC   QC TUMERIC COMPLEX PO       TAKE these medications    (feeding supplement) PROSource Plus liquid Take 30 mLs by mouth 2 (two) times daily between meals for 7 days.   aspirin 81 MG chewable tablet Chew 1 tablet (81 mg total) by mouth 2 (two) times daily for 24 days. Replaces: aspirin EC 81 MG tablet   atorvastatin 10 MG tablet Commonly known as: LIPITOR Take 10 mg by mouth every evening.   camphor-menthol lotion Commonly known as: SARNA Apply topically 2 (two) times daily.   cephALEXin 500 MG capsule Commonly known as: Keflex Take 1 capsule (500 mg total) by mouth 4 (four) times daily. To be taken after surgery   cinacalcet 60 MG tablet Commonly known as: SENSIPAR Take 120 mg by mouth daily with supper.   cromolyn 4 % ophthalmic solution Commonly known as: OPTICROM Place 1 drop into both eyes daily as needed (redness).   DIALYVITE PO Take 1 tablet by mouth daily.   docusate sodium 100 MG capsule Commonly known as: Colace Take 1 capsule (100 mg total) by mouth daily as needed. What changed: when to take this   methocarbamol 500 MG tablet Commonly known as: Robaxin Take 1 tablet (500 mg total) by mouth 2 (two) times daily as needed. To be taken after surgery   midodrine 10 MG tablet Commonly known as: PROAMATINE Take 10 mg by mouth See admin instructions. Take 10 mg by mouth three times a week as directed, bring to dialysis. May need an additional tablet during treatment.   ondansetron 4 MG tablet Commonly known as: Zofran Take 1 tablet (4 mg total) by mouth every 8 (eight) hours as needed for nausea or vomiting.   oxyCODONE-acetaminophen 5-325 MG tablet Commonly known as: Percocet Take 1-2 tablets by mouth every 6 (six)  hours as needed. To be taken after surgery   pantoprazole 40 MG tablet Commonly known as: PROTONIX Take 1 tablet (40 mg total) by mouth 2 (two) times daily.   sevelamer carbonate 800 MG tablet Commonly known as: RENVELA Take 1,600-4,000 mg by mouth See admin instructions. Take 4000 mg by mouth three times daily with meals and 1600-2400 mg with snacks   sucralfate 1 GM/10ML suspension Commonly known as: CARAFATE Take 10 mLs (1 g total) by mouth 4 (four) times daily for 14 days.   topiramate 25 MG tablet Commonly known as: TOPAMAX Take 1 tablet (25 mg total) by mouth 2 (two) times  daily.   vitamin E 180 MG (400 UNITS) capsule Take 400 Units by mouth daily.               Durable Medical Equipment  (From admission, onward)           Start     Ordered   11/14/20 2028  DME Walker rolling  Once       Question:  Patient needs a walker to treat with the following condition  Answer:  History of hip replacement   11/14/20 2027   11/14/20 2028  DME 3 n 1  Once        11/14/20 2027   11/14/20 2028  DME Bedside commode  Once       Question:  Patient needs a bedside commode to treat with the following condition  Answer:  History of hip replacement   11/14/20 2027           Allergies  Allergen Reactions   Infed [Iron Dextran] Other (See Comments)    Decreased BP    Oxycodone Nausea Only   Vicodin [Hydrocodone-Acetaminophen] Other (See Comments)    Decreased BP   Hydrocodone-Acetaminophen     Other reaction(s): lower BP   Diclofenac Other (See Comments)    Unknown    Follow-up Information     Leandrew Koyanagi, MD. Schedule an appointment as soon as possible for a visit in 1 week(s).   Specialty: Orthopedic Surgery Contact information: [place] [address] [phone]         [name], MD. Call today.   Specialty: Family Medicine Why: Please call for a posthospital follow-up appointment. Contact information: [place] [address] [phone]         Buford Dresser, MD .   Specialty: Cardiology Contact information: [address] [place] [address] [place], [place] Follow up.   Specialty: Home Health Services Why: [facility] health agency Contact information: [address] [place] [address] [phone]         [facility] Follow up.   Why: your medical equipment company Contact information: [address] [place] [address] [phone]                  The results of significant diagnostics from this hospitalization (including imaging, microbiology, ancillary and laboratory) are listed below for reference.    Significant Diagnostic Studies: DG Pelvis Portable  Result Date: 11/14/2020 CLINICAL DATA:  Status post left arthroplasty EXAM: PORTABLE PELVIS 1 VIEWS COMPARISON:  Films from earlier in the same day. FINDINGS: Pelvic ring is intact. Left hip replacement is now seen in satisfactory position. No acute bony or soft tissue abnormality is noted. Vascular calcifications are seen. IMPRESSION: Status post left hip replacement Electronically Signed   By: Inez Catalina M.D.   On: 11/14/2020 19:04   DG Ankle Left Port  Result Date: 11/14/2020 CLINICAL DATA:  Ankle pain EXAM: PORTABLE LEFT ANKLE - 2 VIEW COMPARISON:  None. FINDINGS: Soft tissue swelling is present. Age indeterminate avulsions at the tips of the medial and lateral malleoli. Ankle mortise is symmetric. Vascular calcifications. IMPRESSION: Soft tissue swelling. Age indeterminate avulsions off the tips of the medial and lateral malleoli Electronically Signed   By: Donavan Foil M.D.   On: 11/14/2020 19:42   DG C-Arm 1-60 Min-No Report  Result Date: 11/14/2020 Fluoroscopy was  utilized by the requesting physician.  No radiographic interpretation.   DG C-Arm 1-60 Min-No Report  Result Date:  11/14/2020 Fluoroscopy was utilized by the requesting physician.  No radiographic interpretation.   DG HIP OPERATIVE UNILAT WITH PELVIS LEFT  Result Date: 11/14/2020 CLINICAL DATA:  Left hip replacement EXAM: OPERATIVE LEFT HIP WITH PELVIS COMPARISON:  05/19/2020 FLUOROSCOPY TIME:  Radiation Exposure Index (as provided by the fluoroscopic device): 1.98 mGy If the device does not provide the exposure index: Fluoroscopy Time:  18 seconds Number of Acquired Images:  4 FINDINGS: Initial images demonstrate significant destruction of the left femoral head progressed in the interval from the prior exam. Subsequent images demonstrate removal of the femoral neck and femoral head with placement of left hip prosthesis. No acute bony or soft tissue abnormality is seen. Lucent lesion is again noted in the region of the right femoral neck and head. IMPRESSION: Left hip arthroplasty Electronically Signed   By: Inez Catalina M.D.   On: 11/14/2020 19:05    Microbiology: No results found for this or any previous visit (from the past 240 hour(s)).    Labs: Basic Metabolic Panel: Recent Labs  Lab 11/15/20 0033 11/16/20 0850 11/18/20 0800 11/21/20 0153  NA 135 132* 133* 131*  K 4.8 4.5 4.4 4.7  CL 97* 94* 95* 94*  CO2 25 22 24 24   GLUCOSE 103* 88 80 90  BUN 34* 61* 43* 52*  CREATININE 7.64* 9.55* 9.07* 10.00*  CALCIUM 8.0* 7.7* 8.2* 8.2*  PHOS  --  4.6 4.2 4.1   Liver Function Tests: Recent Labs  Lab 11/16/20 0850 11/18/20 0800 11/21/20 0153  ALBUMIN 2.2* 2.1* 1.9*   No results for input(s): LIPASE, AMYLASE in the last 168 hours. No results for input(s): AMMONIA in the last 168 hours. CBC: Recent Labs  Lab 11/15/20 0033 11/16/20 0850 11/16/20 2118 11/17/20 1345 11/18/20 0211 11/19/20 0100 11/20/20 0721 11/21/20 0153  WBC 12.4* 9.7 10.1 9.8 6.8  --   --  7.4  NEUTROABS 11.3*  --   --   --   --   --   --   --   HGB 7.0* 5.4* 7.4* 7.3* 6.7* 6.4* 7.2* 6.3*  HCT 22.6* 17.3* 23.3* 22.8* 20.9*  20.5* 23.0* 20.1*  MCV 88.3 86.9 87.3 88.4 87.1  --   --  88.5  PLT 260 217 259 318 233  --   --  329   Cardiac Enzymes: No results for input(s): CKTOTAL, CKMB, CKMBINDEX, TROPONINI in the last 168 hours. BNP: BNP (last 3 results) No results for input(s): BNP in the last 8760 hours.  ProBNP (last 3 results) No results for input(s): PROBNP in the last 8760 hours.  CBG: No results for input(s): GLUCAP in the last 168 hours.     Signed:  Kayleen Memos, MD [facility] 11/21/2020, 2:35 PM

## 2020-11-21 NOTE — Progress Notes (Signed)
Physical Therapy Treatment Patient Details Name: Carl Sobieski Sr. MRN: [id number] DOB: [birth year] Today's Date: 11/21/2020   History of Present Illness Patient is a 60 y/o male who presents s/p left THA 11/14/20 direct anterior approach. PMH includes TIA, ESRD, DM, HTN, Rt TSA, depression, anxiety, stroke, seizures.    PT Comments    Pt progressing well towards his physical therapy goals. Reports mild hip discomfort and soreness. Session focused on gait training in preparation for return home. Pt ambulating 140 feet with a walker at a supervision level, utilizing a step through pattern. Education provided regarding activity recommendations upon d/c. Will continue to follow acutely.    Recommendations for follow up therapy are one component of a multi-disciplinary discharge planning process, led by the attending physician.  Recommendations may be updated based on patient status, additional functional criteria and insurance authorization.  Follow Up Recommendations  Supervision for mobility/OOB;Home health PT     Equipment Recommendations  Rolling walker with 5" wheels;3in1 (PT) (delivered to room)    Recommendations for Other Services       Precautions / Restrictions Precautions Precautions: Fall Precaution Comments: wound vac Restrictions Weight Bearing Restrictions: Yes LLE Weight Bearing: Weight bearing as tolerated     Mobility  Bed Mobility Overal bed mobility: Modified Independent                  Transfers Overall transfer level: Needs assistance Equipment used: Rolling walker (2 wheeled) Transfers: Sit to/from Stand Sit to Stand: Supervision         General transfer comment: Decreased eccentric control to last few inches to the seat  Ambulation/Gait Ambulation/Gait assistance: Supervision Gait Distance (Feet): 140 Feet Assistive device: Rolling walker (2 wheeled) Gait Pattern/deviations: Trunk flexed;Decreased weight shift to left;Decreased  dorsiflexion - left;Step-through pattern Gait velocity: decreased   General Gait Details: Pt with step through pattern, cues for walker proximity and gluteal activation for upright posture   Stairs             Wheelchair Mobility    Modified Rankin (Stroke Patients Only)       Balance     Sitting balance-Leahy Scale: Good       Standing balance-Leahy Scale: Fair                              Cognition Arousal/Alertness: Awake/alert Behavior During Therapy: WFL for tasks assessed/performed;Impulsive Overall Cognitive Status: Within Functional Limits for tasks assessed                                        Exercises      General Comments        Pertinent Vitals/Pain Pain Assessment: Faces Faces Pain Scale: Hurts little more Pain Location: L hip Pain Descriptors / Indicators: Discomfort;Operative site guarding;Sore Pain Intervention(s): Monitored during session    Home Living                      Prior Function            PT Goals (current goals can now be found in the care plan section) Acute Rehab PT Goals Patient Stated Goal: go home when ready PT Goal Formulation: With patient Time For Goal Achievement: 11/29/20 Potential to Achieve Goals: Good Progress towards PT goals: Progressing toward goals    Frequency  7X/week      PT Plan Current plan remains appropriate    Co-evaluation              AM-PAC PT "6 Clicks" Mobility   Outcome Measure  Help needed turning from your back to your side while in a flat bed without using bedrails?: None Help needed moving from lying on your back to sitting on the side of a flat bed without using bedrails?: None Help needed moving to and from a bed to a chair (including a wheelchair)?: None Help needed standing up from a chair using your arms (e.g., wheelchair or bedside chair)?: A Little Help needed to walk in hospital room?: A Little Help needed  climbing 3-5 steps with a railing? : A Little 6 Click Score: 21    End of Session Equipment Utilized During Treatment: Gait belt Activity Tolerance: Patient tolerated treatment well Patient left: in chair;with call bell/phone within reach Nurse Communication: Mobility status PT Visit Diagnosis: Pain;Muscle weakness (generalized) (M62.81);Difficulty in walking, not elsewhere classified (R26.2);History of falling (Z91.81) Pain - Right/Left: Left Pain - part of body: Hip;Knee     Time: 0518-3358 PT Time Calculation (min) (ACUTE ONLY): 35 min  Charges:  $Gait Training: 8-22 mins $Therapeutic Activity: 8-22 mins                     Wyona Almas, PT, DPT Acute Rehabilitation Services Pager [phone] Office [phone]    Deno Etienne 11/21/2020, 5:13 PM

## 2020-11-21 NOTE — Progress Notes (Addendum)
Subjective: 3 Days Post-Op Procedure(s) (LRB): ESOPHAGOGASTRODUODENOSCOPY (EGD) (N/A) HOT HEMOSTASIS (ARGON PLASMA COAGULATION/BICAP) BIOPSY Patient in hemodialysis this am.    Objective: Vital signs in last 24 hours: Temp:  [97.9 F (36.6 C)-98.7 F (37.1 C)] 97.9 F (36.6 C) (10/10 0733) Pulse Rate:  [76-97] 76 (10/10 0800) Resp:  [16-18] 16 (10/10 0733) BP: (106-124)/(69-87) 116/69 (10/10 0800) SpO2:  [100 %] 100 % (10/10 0733) Weight:  [94.3 kg] 94.3 kg (10/10 0733)  Intake/Output from previous day: 10/09 0701 - 10/10 0700 In: 240 [P.O.:240] Out: -  Intake/Output this shift: No intake/output data recorded.  Recent Labs    11/19/20 0100 11/20/20 0721 11/21/20 0153  HGB 6.4* 7.2* 6.3*   Recent Labs    11/20/20 0721 11/21/20 0153  WBC  --  7.4  RBC  --  2.27*  HCT 23.0* 20.1*  PLT  --  329   Recent Labs    11/21/20 0153  NA 131*  K 4.7  CL 94*  CO2 24  BUN 52*  CREATININE 10.00*  GLUCOSE 90  CALCIUM 8.2*   No results for input(s): LABPT, INR in the last 72 hours.     Assessment/Plan: 3 Days Post-Op Procedure(s) (LRB): ESOPHAGOGASTRODUODENOSCOPY (EGD) (N/A) HOT HEMOSTASIS (ARGON PLASMA COAGULATION/BICAP) BIOPSY S/p left total hip replacement  PLAN Up with therapy WBAT LLE Hemoglobin trended down overnight. Will likely recheck in hd this am Will need incisional vac for two weeks post-op.  Please switch to portable unit upon discharge Ok from ortho to switch from asa to lovenox for dvt ppx.. I have sent in lovenox to pharmacy.  Pain meds sent in last week Stable from ortho to be d/c home once medically stable       Aundra Dubin 11/21/2020, 8:21 AM

## 2020-11-22 DIAGNOSIS — M1612 Unilateral primary osteoarthritis, left hip: Secondary | ICD-10-CM | POA: Diagnosis not present

## 2020-11-22 LAB — PREPARE RBC (CROSSMATCH)

## 2020-11-22 LAB — HEMOGLOBIN AND HEMATOCRIT, BLOOD
HCT: 25 % — ABNORMAL LOW (ref 39.0–52.0)
Hemoglobin: 7.8 g/dL — ABNORMAL LOW (ref 13.0–17.0)

## 2020-11-22 MED ORDER — SODIUM CHLORIDE 0.9% IV SOLUTION: Freq: Once | INTRAVENOUS | Status: DC

## 2020-11-22 NOTE — Progress Notes (Signed)
Occupational Therapy Treatment Patient Details Name: Carl Willems Sr. MRN: [id number] DOB: 1960/10/17 Today's Date: 11/22/2020   History of present illness Patient is a 60 y/o male who presents s/p left THA 11/14/20 direct anterior approach. PMH includes TIA, ESRD, DM, HTN, Rt TSA, depression, anxiety, stroke, seizures.   OT comments  Pt is progressing towards OT goals. Pt educated on AE use to perform LB dressing, demonstrating good learning. Pt able to dress LB with Min guard using AE after education. Pt reports that he plans to d/c home tomorrow if medically cleared and should be safe to do so. Pt states he has all the equipment he needs and has no concerns with his ability to perform ADLs at home. Will continue to follow acutely.   Recommendations for follow up therapy are one component of a multi-disciplinary discharge planning process, led by the attending physician.  Recommendations may be updated based on patient status, additional functional criteria and insurance authorization.    Follow Up Recommendations  No OT follow up    Equipment Recommendations  3 in 1 bedside commode    Recommendations for Other Services      Precautions / Restrictions Precautions Precautions: Fall Precaution Comments: wound vac Restrictions Weight Bearing Restrictions: Yes LLE Weight Bearing: Weight bearing as tolerated       Mobility Bed Mobility Overal bed mobility: Modified Independent                  Transfers                      Balance Overall balance assessment: Needs assistance Sitting-balance support: Feet supported;No upper extremity supported Sitting balance-Leahy Scale: Good     Standing balance support: During functional activity;No upper extremity supported Standing balance-Leahy Scale: Fair                             ADL either performed or assessed with clinical judgement   ADL Overall ADL's : Needs assistance/impaired                      Lower Body Dressing: Min guard;With adaptive equipment;Cueing for compensatory techniques;Sit to/from stand                       Vision       Perception     Praxis      Cognition Arousal/Alertness: Awake/alert Behavior During Therapy: [facility] for tasks assessed/performed;Impulsive Overall Cognitive Status: Within Functional Limits for tasks assessed                                          Exercises     Shoulder Instructions       General Comments      Pertinent Vitals/ Pain       Pain Assessment: Faces Faces Pain Scale: Hurts a little bit Pain Location: L hip Pain Descriptors / Indicators: Discomfort;Operative site guarding;Sore Pain Intervention(s): Monitored during session  Home Living                                          Prior Functioning/Environment              Frequency  Min 2X/week        Progress Toward Goals  OT Goals(current goals can now be found in the care plan section)  Progress towards OT goals: Progressing toward goals  Acute Rehab OT Goals Patient Stated Goal: go home when ready OT Goal Formulation: With patient Time For Goal Achievement: 12/01/20 Potential to Achieve Goals: Good ADL Goals Pt Will Perform Lower Body Dressing: with min guard assist;sit to/from stand Pt Will Transfer to Toilet: with modified independence;grab bars Pt Will Perform Tub/Shower Transfer: Shower transfer;with modified independence;3 in 1;rolling walker  Plan Discharge plan remains appropriate;Frequency remains appropriate    Co-evaluation                 AM-PAC OT "6 Clicks" Daily Activity     Outcome Measure   Help from another person eating meals?: None Help from another person taking care of personal grooming?: None Help from another person toileting, which includes using toliet, bedpan, or urinal?: A Little Help from another person bathing (including washing, rinsing, drying)?:  A Little Help from another person to put on and taking off regular upper body clothing?: None Help from another person to put on and taking off regular lower body clothing?: A Little 6 Click Score: 21    End of Session Equipment Utilized During Treatment: Rolling walker;Other (comment) (reacher, sock aid)  OT Visit Diagnosis: Unsteadiness on feet (R26.81);Muscle weakness (generalized) (M62.81);Pain Pain - Right/Left: Left Pain - part of body: Hip   Activity Tolerance Patient tolerated treatment well   Patient Left in bed;with call bell/phone within reach   Nurse Communication Mobility status        Time: 7322-0254 OT Time Calculation (min): 18 min  Charges: OT General Charges $OT Visit: 1 Visit OT Treatments $Self Care/Home Management : 8-22 mins  Chez Bulnes C, OT/L  Acute Rehab [facility] 11/22/2020, 10:51 AM

## 2020-11-22 NOTE — Progress Notes (Signed)
Progress note  Carl Luka Sr. YCX:[id number] DOB: 07-30-1960  PCP: [name], MD  Admit date: 11/14/2020 Discharge date: 11/22/2020  Time spent: 35 minutes.    Recommendations for Outpatient Follow-up:  Follow-up with orthopedic surgery Follow-up with your primary care provider Keep your hemodialysis appointments. Take your medications as prescribed Continue PT OT with assistance and fall precautions.  Discharge Diagnoses:  Active Hospital Problems   Diagnosis Date Noted   Primary osteoarthritis of left hip 10/07/2020   Mucosal abnormality of esophagus    Mucosal abnormality of stomach    Mucosal abnormality of duodenum    Upper GI bleed    Left hip pain 11/16/2020   Type 2 diabetes mellitus with other specified complication ([facility]) 49/70/2637   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/14/2020   ESRD on hemodialysis ([place]) 03/08/2020   Acute gastric ulcer with hemorrhage    Essential hypertension    Obstructive sleep apnea 02/08/2015   HLD (hyperlipidemia)    History of lacunar cerebrovascular accident (CVA) 05/23/2014   Anemia of renal disease 05/23/2014   GERD (gastroesophageal reflux disease) 12/09/2012    Resolved Hospital Problems  No resolved problems to display.    Discharge Condition: Stable  Diet recommendation: Resume previous diet, renal dialysis diet.  Vitals:   11/21/20 2228 11/22/20 0919  BP: 114/68 110/81  Pulse: (!) 101 100  Resp: 16 17  Temp: 99.4 F (37.4 C) 98.5 F (36.9 C)  SpO2: 100% 100%    History of present illness:  60-year with history of ESRD on HD Monday Wednesday Friday, HTN, DM2, TIA, lacunar CVA, GERD, anemia of chronic disease, HLD, OSA, osteoarthritis of the left hip admitted for left hip replacement.  Medical team initially consulted for medication management. Care transferred to [facility] on 11/16/2020.  Hospital course complicated by symptomatic anemia with hemoglobin as low as 5.4 K with positive FOBT.  He was  transfused 1 unit PRBC at hemodialysis on 11/16/2020, hemoglobin corrected to 7.3 K.  Seen by GI, EGD performed on 11/18/2020 by GI [place], Dr. Candis Schatz showed pyloric channel ulcer with visible vessel status post BICAP.  He had another drop in hemoglobin 6.3 K on 11/21/2020 for which he was transfused 1 unit PRBC at hemodialysis.  Per GI, Dr. Lorenso Courier (on 11/21/2020), okay to continue aspirin 81 mg twice daily and continue PPI twice daily x30 days and then daily.  Will need to follow-up with GI [place] posthospitalization.   Patient received 1 unit PRBC at hemodialysis on 11/21/2020 for hemoglobin of 6.3 K.  Repeated hemoglobin posttransfusion 8.4 K.  11/22/20: Seen at bedside.  Had a bowel movement overnight.  Repeat hemoglobin this morning 7.8 K.  We will monitor overnight and repeat CBC in the morning at dialysis.  Transfuse hemoglobin below 7.0 K, if patient is agreeable.     Hospital Course:  Principal Problem:   Primary osteoarthritis of left hip Active Problems:   GERD (gastroesophageal reflux disease)   History of lacunar cerebrovascular accident (CVA)   Anemia of renal disease   HLD (hyperlipidemia)   Essential hypertension   Acute gastric ulcer with hemorrhage   ESRD on hemodialysis ([place])   Obstructive sleep apnea   Type 2 diabetes mellitus with other specified complication (HCC)   Status post total replacement of left hip   Left hip pain   Mucosal abnormality of esophagus   Mucosal abnormality of stomach   Mucosal abnormality of duodenum   Upper GI bleed  Post left total hip arthroplasty by  orthopedic surgery Dr. Erlinda Hong on 11/14/2020 POD #8. Primary osteoarthritis of left hip Pain management and postop DVT prophylaxis per orthopedic surgery.  Recommended aspirin 81 mg twice daily x30 days. PT recommended home health PT. Continue PT OT with assistance and fall precautions.     Acute blood loss anemia, positive FOBT, iron deficiency anemia, anemia of chronic disease on  hemodialysis. Patient has received total of 2 units PRBCs transfusion during this admission.  Received 1 unit PRBC on 11/16/2020 for hemoglobin of 5.4K and received 1 unit PRBC on 11/21/2020 for hemoglobin of 6.3K.  Repeat hemoglobin posttransfusion 8.4 K on 11/21/2020. Hemoglobin this morning dropped to 7.8 K Monitor and follow-up CBC at dialysis on 11/23/2020.  Post EGD on 11/18/2020 by Dr. Candis Schatz:  Esophagitis dissecans. Biopsied. - 3 cm hiatal hernia. - Enlarged gastric folds. Biopsied. - Non-bleeding gastric ulcer with adherent clot. Treated with bipolar cautery. This ulcer may have been more mechanical in etiology rather than from NSAIDs based on the location within the pylorus - Non-bleeding duodenal diverticulum. - Congested duodenal mucosa. Biopsied.  Impression: - PPI twice daily x1 month then daily. - Use sucralfate suspension 1 gram PO QID for 2 weeks. - Repeat upper endoscopy in 8 weeks to check healing. - GI   Acute left ankle pain No evidence of infection.   Uric acid levels are normal.  Follow-up with orthopedic surgery.    ESRD on hemodialysis MWF  Completed HD on 11/16/2020, 11/18/2020, and on 11/21/2020.   Esophagitis dissecans/nonbleeding gastric ulcer with adherent clot/nonbleeding duodenal diverticulum/congested duodenal mucosa/GERD (gastroesophageal reflux disease) Received IV Protonix 40 mg twice daily due to concern for upper GI bleed prior to EGD on EGD 11/18/2020. GI recommended p.o. Protonix 40 mg twice daily x30 days, then daily.  Sucralfate suspension 1 g p.o. 4 times daily x14 days. Follow-up with GI outpatient.     History of lacunar cerebrovascular accident (CVA) On aspirin 81 mg twice daily for DVT prophylaxis Continue home Lipitor PT OT assessment, recommendation for home health PT     HLD (hyperlipidemia) - Continue home Lipitor     Essential hypertension Continue home midodrine MWF     Obstructive sleep apnea -Not on home CPAP.   Dry  skin from ESRD Continue as needed p.o. Benadryl and Sarna lotion  Ambulatory dysfunction post surgery Continue PT OT with assistance and fall precautions Plan to return to home with home health services PT OT [facility] assisting with home health services arrangement.       Code Status: Full code     Code Status: Full code   Consultants: Nephrology Orthopedic surgery GI.   Procedures: HD on 11/16/2020, 11/18/2020, 11/21/2020.   Antimicrobials: None.   DVT prophylaxis: Aspirin 81 mg twice daily.    Discharge Exam: BP 110/81 (BP Location: Left Arm)   Pulse 100   Temp 98.5 F (36.9 C) (Oral)   Resp 17   Ht 6\' 1"  (1.854 m)   Wt 92.2 kg   SpO2 100%   BMI 26.82 kg/m  General: 60 y.o. year-old male well-developed well-nourished no acute distress.  He is alert and oriented x3.   Cardiovascular: Regular rate and rhythm no rubs or gallops. Respiratory: Clear to auscultation with no wheezes or rales.  Abdomen: Soft Nontender Normal Bowel Sounds Present. Musculoskeletal: No extremity edema bilaterally. Skin: No ulcerative lesions noted. Psychiatry: Mood is appropriate for condition and setting.  Discharge Instructions You were cared for by a hospitalist during your hospital stay. If you have any  questions about your discharge medications or the care you received while you were in the hospital after you are discharged, you can call the unit and asked to speak with the hospitalist on call if the hospitalist that took care of you is not available. Once you are discharged, your primary care physician will handle any further medical issues. Please note that NO REFILLS for any discharge medications will be authorized once you are discharged, as it is imperative that you return to your primary care physician (or establish a relationship with a primary care physician if you do not have one) for your aftercare needs so that they can reassess your need for medications and monitor your lab  values.   Allergies as of 11/22/2020       Reactions   Infed [iron Dextran] Other (See Comments)   Decreased BP    Oxycodone Nausea Only   Vicodin [hydrocodone-acetaminophen] Other (See Comments)   Decreased BP   Hydrocodone-acetaminophen    Other reaction(s): lower BP   Diclofenac Other (See Comments)   Unknown        Medication List     STOP taking these medications    aspirin EC 81 MG tablet Replaced by: aspirin 81 MG chewable tablet   cephALEXin 500 MG capsule Commonly known as: Keflex   gabapentin 100 MG capsule Commonly known as: NEURONTIN   ibuprofen 800 MG tablet Commonly known as: ADVIL   omeprazole 20 MG capsule Commonly known as: PRILOSEC   QC TUMERIC COMPLEX PO       TAKE these medications    (feeding supplement) PROSource Plus liquid Take 30 mLs by mouth 2 (two) times daily between meals for 7 days.   aspirin 81 MG chewable tablet Chew 1 tablet (81 mg total) by mouth 2 (two) times daily for 24 days. Replaces: aspirin EC 81 MG tablet   atorvastatin 10 MG tablet Commonly known as: LIPITOR Take 10 mg by mouth every evening.   camphor-menthol lotion Commonly known as: SARNA Apply topically 2 (two) times daily.   cinacalcet 60 MG tablet Commonly known as: SENSIPAR Take 120 mg by mouth daily with supper.   cromolyn 4 % ophthalmic solution Commonly known as: OPTICROM Place 1 drop into both eyes daily as needed (redness).   DIALYVITE PO Take 1 tablet by mouth daily.   docusate sodium 100 MG capsule Commonly known as: Colace Take 1 capsule (100 mg total) by mouth daily as needed. What changed: when to take this   methocarbamol 500 MG tablet Commonly known as: Robaxin Take 1 tablet (500 mg total) by mouth 2 (two) times daily as needed. To be taken after surgery   midodrine 10 MG tablet Commonly known as: PROAMATINE Take 10 mg by mouth See admin instructions. Take 10 mg by mouth three times a week as directed, bring to dialysis. May  need an additional tablet during treatment.   ondansetron 4 MG tablet Commonly known as: Zofran Take 1 tablet (4 mg total) by mouth every 8 (eight) hours as needed for nausea or vomiting.   oxyCODONE-acetaminophen 5-325 MG tablet Commonly known as: Percocet Take 1-2 tablets by mouth every 6 (six) hours as needed. To be taken after surgery   pantoprazole 40 MG tablet Commonly known as: PROTONIX Take 1 tablet (40 mg total) by mouth 2 (two) times daily.   sevelamer carbonate 800 MG tablet Commonly known as: RENVELA Take 1,600-4,000 mg by mouth See admin instructions. Take 4000 mg by mouth three times daily with  meals and 1600-2400 mg with snacks   sucralfate 1 GM/10ML suspension Commonly known as: CARAFATE Take 10 mLs (1 g total) by mouth 4 (four) times daily for 14 days.   topiramate 25 MG tablet Commonly known as: TOPAMAX Take 1 tablet (25 mg total) by mouth 2 (two) times daily.   vitamin E 180 MG (400 UNITS) capsule Take 400 Units by mouth daily.               Durable Medical Equipment  (From admission, onward)           Start     Ordered   11/14/20 2028  DME Walker rolling  Once       Question:  Patient needs a walker to treat with the following condition  Answer:  History of hip replacement   11/14/20 2027   11/14/20 2028  DME 3 n 1  Once        11/14/20 2027   11/14/20 2028  DME Bedside commode  Once       Question:  Patient needs a bedside commode to treat with the following condition  Answer:  History of hip replacement   11/14/20 2027           Allergies  Allergen Reactions   Infed [Iron Dextran] Other (See Comments)    Decreased BP    Oxycodone Nausea Only   Vicodin [Hydrocodone-Acetaminophen] Other (See Comments)    Decreased BP   Hydrocodone-Acetaminophen     Other reaction(s): lower BP   Diclofenac Other (See Comments)    Unknown    Follow-up Information     Leandrew Koyanagi, MD. Schedule an appointment as soon as possible for a visit on  11/29/2020.   Specialty: Orthopedic Surgery Contact information: [place] [address] [phone]         [name], MD. Call today.   Specialty: Family Medicine Why: Please call for a posthospital follow-up appointment. Contact information: [place] [address] [phone]         Buford Dresser, MD .   Specialty: Cardiology Contact information: [address] [place] [address] [place], [place] Follow up.   Specialty: Home Health Services Why: [facility] agency Contact information: [address] [place] [address] [phone]         [facility] Follow up.   Why: your medical equipment company Contact information: [address] [place] [address] [phone]                  The results of significant diagnostics from this hospitalization (including imaging, microbiology, ancillary and laboratory) are listed below for reference.    Significant Diagnostic Studies: DG Pelvis Portable  Result Date: 11/14/2020 CLINICAL DATA:  Status post left arthroplasty EXAM: PORTABLE PELVIS 1 VIEWS COMPARISON:  Films from earlier in the same day. FINDINGS: Pelvic ring is intact. Left hip replacement is now seen in satisfactory position. No acute bony or soft tissue abnormality is noted. Vascular calcifications are seen. IMPRESSION: Status post left hip replacement Electronically Signed   By: Inez Catalina M.D.   On: 11/14/2020 19:04   DG Ankle Left Port  Result Date: 11/14/2020 CLINICAL DATA:  Ankle pain EXAM: PORTABLE LEFT ANKLE - 2 VIEW COMPARISON:  None. FINDINGS: Soft tissue swelling is present. Age indeterminate avulsions at the tips of the medial and  lateral malleoli. Ankle mortise is symmetric. Vascular calcifications. IMPRESSION: Soft tissue swelling. Age indeterminate avulsions off the tips of the  medial and lateral malleoli Electronically Signed   By: Donavan Foil M.D.   On: 11/14/2020 19:42   DG C-Arm 1-60 Min-No Report  Result Date: 11/14/2020 Fluoroscopy was utilized by the requesting physician.  No radiographic interpretation.   DG C-Arm 1-60 Min-No Report  Result Date: 11/14/2020 Fluoroscopy was utilized by the requesting physician.  No radiographic interpretation.   DG HIP OPERATIVE UNILAT WITH PELVIS LEFT  Result Date: 11/14/2020 CLINICAL DATA:  Left hip replacement EXAM: OPERATIVE LEFT HIP WITH PELVIS COMPARISON:  05/19/2020 FLUOROSCOPY TIME:  Radiation Exposure Index (as provided by the fluoroscopic device): 1.98 mGy If the device does not provide the exposure index: Fluoroscopy Time:  18 seconds Number of Acquired Images:  4 FINDINGS: Initial images demonstrate significant destruction of the left femoral head progressed in the interval from the prior exam. Subsequent images demonstrate removal of the femoral neck and femoral head with placement of left hip prosthesis. No acute bony or soft tissue abnormality is seen. Lucent lesion is again noted in the region of the right femoral neck and head. IMPRESSION: Left hip arthroplasty Electronically Signed   By: Inez Catalina M.D.   On: 11/14/2020 19:05    Microbiology: No results found for this or any previous visit (from the past 240 hour(s)).    Labs: Basic Metabolic Panel: Recent Labs  Lab 11/16/20 0850 11/18/20 0800 11/21/20 0153  NA 132* 133* 131*  K 4.5 4.4 4.7  CL 94* 95* 94*  CO2 22 24 24   GLUCOSE 88 80 90  BUN 61* 43* 52*  CREATININE 9.55* 9.07* 10.00*  CALCIUM 7.7* 8.2* 8.2*  PHOS 4.6 4.2 4.1   Liver Function Tests: Recent Labs  Lab 11/16/20 0850 11/18/20 0800 11/21/20 0153  ALBUMIN 2.2* 2.1* 1.9*   No results for input(s): LIPASE, AMYLASE in the last 168 hours. No results for input(s): AMMONIA in the last 168 hours. CBC: Recent Labs  Lab 11/16/20 0850 11/16/20 2118 11/17/20 1345 11/18/20 0211  11/19/20 0100 11/20/20 0721 11/21/20 0153 11/21/20 1943 11/22/20 0733  WBC 9.7 10.1 9.8 6.8  --   --  7.4  --   --   HGB 5.4* 7.4* 7.3* 6.7* 6.4* 7.2* 6.3* 8.4* 7.8*  HCT 17.3* 23.3* 22.8* 20.9* 20.5* 23.0* 20.1* 26.6* 25.0*  MCV 86.9 87.3 88.4 87.1  --   --  88.5  --   --   PLT 217 259 318 233  --   --  329  --   --    Cardiac Enzymes: No results for input(s): CKTOTAL, CKMB, CKMBINDEX, TROPONINI in the last 168 hours. BNP: BNP (last 3 results) No results for input(s): BNP in the last 8760 hours.  ProBNP (last 3 results) No results for input(s): PROBNP in the last 8760 hours.  CBG: No results for input(s): GLUCAP in the last 168 hours.     Signed:  Kayleen Memos, MD [facility] 11/22/2020, 12:31 PM

## 2020-11-22 NOTE — Progress Notes (Addendum)
Patient has given consent for 1 unit PRBC blood transfusion after hemodialysis on 11/23/2020.  1 unit PRBC has been ordered and will be transfused after hemodialysis tomorrow.  H&H will be obtained after blood transfusion.

## 2020-11-22 NOTE — Plan of Care (Signed)

## 2020-11-22 NOTE — Progress Notes (Signed)
Case discussed with RN CM. Pt is for hopeful d/c in the am if hemoglobin remains stable. Contacted [place] [facility] and spoke to [place], [place] manager. Pt can receive treatment at clinic tomorrow if pt arrives by 11:15 for 11:30 chair time. CM discussed above with pt and MD. Pt will be seen early in the am in order to make it to out-pt clinic for treatment. Contacted inpt HD unit to request that pt be placed on 2nd shift due to plans for early d/c if pt remains stable. Nephrologist updated as well.  Melven Sartorius Renal Navigator [phone]

## 2020-11-22 NOTE — Progress Notes (Signed)
[facility] Progress Note   Subjective: Seen in room, tolerated HD yesterday, net uf ~2L. S/p 1u prbc, hgb post tranfusion 8.4 (higher than expected), stable today at 7.8. he is comfortable with going home today.  Objective Vitals:   11/21/20 1234 11/21/20 1322 11/21/20 2228 11/22/20 0919  BP: 140/76 134/75 114/68 110/81  Pulse: 78 95 (!) 101 100  Resp: 16 16 16 17   Temp: 97.6 F (36.4 C) 98.8 F (37.1 C) 99.4 F (37.4 C) 98.5 F (36.9 C)  TempSrc: Oral Oral Oral Oral  SpO2: 100% 100% 100% 100%  Weight: 92.2 kg     Height:       Physical Exam General: NAD Heart: S1,S2 RRR No M/R/G Lungs: CTAB A/P Abdomen: Soft, NABS Extremities: No edema. Neuro: awake, alert Dialysis Access: R AVF aneurysmal +T/B   Additional Objective Labs: Basic Metabolic Panel: Recent Labs  Lab 11/16/20 0850 11/18/20 0800 11/21/20 0153  NA 132* 133* 131*  K 4.5 4.4 4.7  CL 94* 95* 94*  CO2 22 24 24   GLUCOSE 88 80 90  BUN 61* 43* 52*  CREATININE 9.55* 9.07* 10.00*  CALCIUM 7.7* 8.2* 8.2*  PHOS 4.6 4.2 4.1   Liver Function Tests: Recent Labs  Lab 11/16/20 0850 11/18/20 0800 11/21/20 0153  ALBUMIN 2.2* 2.1* 1.9*   No results for input(s): LIPASE, AMYLASE in the last 168 hours. CBC: Recent Labs  Lab 11/16/20 0850 11/16/20 2118 11/17/20 1345 11/18/20 0211 11/19/20 0100 11/21/20 0153 11/21/20 1943 11/22/20 0733  WBC 9.7 10.1 9.8 6.8  --  7.4  --   --   HGB 5.4* 7.4* 7.3* 6.7*   < > 6.3* 8.4* 7.8*  HCT 17.3* 23.3* 22.8* 20.9*   < > 20.1* 26.6* 25.0*  MCV 86.9 87.3 88.4 87.1  --  88.5  --   --   PLT 217 259 318 233  --  329  --   --    < > = values in this interval not displayed.   Blood Culture    Component Value Date/Time   SDES BLOOD LEFT HAND 08/01/2015 0624   SPECREQUEST BOTTLES DRAWN AEROBIC AND ANAEROBIC  5CC 08/01/2015 0624   CULT NO GROWTH 5 DAYS 08/01/2015 0624   REPTSTATUS 08/06/2015 FINAL 08/01/2015 0624    Cardiac Enzymes: No results for  input(s): CKTOTAL, CKMB, CKMBINDEX, TROPONINI in the last 168 hours. CBG: No results for input(s): GLUCAP in the last 168 hours. Iron Studies: No results for input(s): IRON, TIBC, TRANSFERRIN, FERRITIN in the last 72 hours. @lablastinr3 @ Studies/Results: No results found. Medications:  methocarbamol (ROBAXIN) IV      (feeding supplement) PROSource Plus  30 mL Oral BID BM   sodium chloride   Intravenous Once   sodium chloride   Intravenous Once   aspirin  81 mg Oral BID   atorvastatin  10 mg Oral QPM   camphor-menthol   Topical BID   Chlorhexidine Gluconate Cloth  6 each Topical Q0600   cinacalcet  120 mg Oral Q supper   darbepoetin (ARANESP) injection - DIALYSIS  200 mcg Intravenous Q Wed-HD   docusate sodium  100 mg Oral BID   midodrine  10 mg Oral Q M,W,F   oxyCODONE  10 mg Oral Q12H   pantoprazole  40 mg Oral BID   polyethylene glycol  17 g Oral Daily   sevelamer carbonate  1,600 mg Oral With snacks   sevelamer carbonate  4,000 mg Oral TID with meals   sucralfate  1  g Oral TID WC & HS   topiramate  25 mg Oral BID     Dialysis Orders: MWF at [facility], 400/500, EDW 88kg, 2K/2.5Ca, AVF - heparin 2000 units IV TIW - Mircera 167mcg IV q 2 weeks (last 9/21 for Hgb 7.7)   Assessment/Plan:  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  ESRD:  Continue HD per MWF schedule - Next HD 11/23/2020 if still here  BP/volume: BP controlled, no edema on exam. Uses midodrine for HD.  Anemia (ESRD + GIB): Hgb down to 5.4, agreed for 1U PRBCs, given on 10/5. FOBT positive. S/p Aranesp 265mcg on 10/4. Tsat low - allergy to IV iron listed in chart. Hgb trending down again. GI consulted. 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 Patient is Jehovah Witness but has agreed to transfusion if needed. Hgb stable today, s/p 1u prbc 83/09  Metabolic bone disease: Ca/Phos good - continue home binders/sensipar. Nutrition: Alb low, push protein.  T2DM-per primary   Hx  CVA  Gean Quint, MD [facility]

## 2020-11-22 NOTE — Progress Notes (Signed)
Upon entering the room, patient announces he has a stool ready for collection. After looking at the orders and notes- no order for FOBT noted. Informed patient; patient immediately becomes irritable and tells this nurse "you need to catch up. I need to know if I am still bleeding so we can get this situated before I am discharged." Patient continues to talk over this nurse. Allowed the patient to continue to vent and explained that I would return for further explanation.  Upon retuning, patient seems to have calmed down and is accepting of education. Explained to patient that he is to follow up with GI after discharge and continue to take the medications ordered prescribed to treat the ulcer. Patient verbalized understanding. Also noted that post transfusion Hemoglobin has increased to 8.4; patient verbalizes understanding and acceptance.

## 2020-11-22 NOTE — Progress Notes (Signed)
Physical Therapy Treatment Patient Details Name: Carl Verrette Sr. MRN: [id number] DOB: 05/08/1960 Today's Date: 11/22/2020   History of Present Illness Patient is a 60 y/o male who presents s/p left THA 11/14/20 direct anterior approach. PMH includes TIA, ESRD, DM, HTN, Rt TSA, depression, anxiety, stroke, seizures.    PT Comments    Patient progressing well towards PT goals. Improved ambulation distance today with supervision for safety. Needs cues to activate glutes during stance phase and for upright posture for a more normalized gait pattern. Reports he has been getting up to go to the bathroom in his room. Encouraged continued mobility. Concerned about his hemoglobin. Will continue to follow and progress. Will try stairs next session.    Recommendations for follow up therapy are one component of a multi-disciplinary discharge planning process, led by the attending physician.  Recommendations may be updated based on patient status, additional functional criteria and insurance authorization.  Follow Up Recommendations  Supervision for mobility/OOB;Home health PT     Equipment Recommendations  Rolling walker with 5" wheels;3in1 (PT) (already delivered)    Recommendations for Other Services       Precautions / Restrictions Precautions Precautions: Fall;Other (comment) Precaution Comments: wound vac Restrictions Weight Bearing Restrictions: Yes LLE Weight Bearing: Weight bearing as tolerated     Mobility  Bed Mobility Overal bed mobility: Modified Independent             General bed mobility comments: Sitting EOB upon PT arrival.    Transfers Overall transfer level: Needs assistance Equipment used: Rolling walker (2 wheeled) Transfers: Sit to/from Stand Sit to Stand: Supervision         General transfer comment: SUpervision for safety, decreased eccentric control last few inches to seat. Transferred to chair post ambulation.  Ambulation/Gait Ambulation/Gait  assistance: Supervision Gait Distance (Feet): 175 Feet Assistive device: Rolling walker (2 wheeled) Gait Pattern/deviations: Trunk flexed;Decreased weight shift to left;Decreased dorsiflexion - left;Step-through pattern Gait velocity: decreased Gait velocity interpretation: <1.31 ft/sec, indicative of household ambulator General Gait Details: Pt with step through pattern, cues for walker proximity and gluteal activation for upright posture. A few standing rest breaks.   Stairs             Wheelchair Mobility    Modified Rankin (Stroke Patients Only)       Balance Overall balance assessment: Needs assistance Sitting-balance support: Feet supported;No upper extremity supported Sitting balance-Leahy Scale: Good Sitting balance - Comments: Reports he donned his own pants today   Standing balance support: During functional activity Standing balance-Leahy Scale: Fair Standing balance comment: Able to stand statically withuot UE support but does better with UE support during walking.                            Cognition Arousal/Alertness: Awake/alert Behavior During Therapy: WFL for tasks assessed/performed Overall Cognitive Status: Within Functional Limits for tasks assessed                                        Exercises      General Comments        Pertinent Vitals/Pain Pain Assessment: 0-10 Pain Score: 5  Faces Pain Scale: Hurts a little bit Pain Location: L hip Pain Descriptors / Indicators: Operative site guarding;Sore Pain Intervention(s): Monitored during session;Repositioned    Home Living  Prior Function            PT Goals (current goals can now be found in the care plan section) Acute Rehab PT Goals Patient Stated Goal: go home when ready Progress towards PT goals: Progressing toward goals    Frequency    7X/week      PT Plan Current plan remains appropriate    Co-evaluation               AM-PAC PT "6 Clicks" Mobility   Outcome Measure  Help needed turning from your back to your side while in a flat bed without using bedrails?: None Help needed moving from lying on your back to sitting on the side of a flat bed without using bedrails?: None Help needed moving to and from a bed to a chair (including a wheelchair)?: None Help needed standing up from a chair using your arms (e.g., wheelchair or bedside chair)?: A Little Help needed to walk in hospital room?: A Little Help needed climbing 3-5 steps with a railing? : A Little 6 Click Score: 21    End of Session Equipment Utilized During Treatment: Gait belt Activity Tolerance: Patient tolerated treatment well Patient left: in chair;with call bell/phone within reach Nurse Communication: Mobility status PT Visit Diagnosis: Pain;Muscle weakness (generalized) (M62.81);Difficulty in walking, not elsewhere classified (R26.2);History of falling (Z91.81) Pain - Right/Left: Left Pain - part of body: Hip;Knee     Time: 5621-3086 PT Time Calculation (min) (ACUTE ONLY): 28 min  Charges:  $Gait Training: 23-37 mins                     Marisa Severin, PT, DPT Acute Rehabilitation Services Pager [phone] Office [place] 11/22/2020, 12:16 PM

## 2020-11-22 NOTE — Progress Notes (Signed)
Subjective: 4 Days Post-Op Procedure(s) (LRB): ESOPHAGOGASTRODUODENOSCOPY (EGD) (N/A) HOT HEMOSTASIS (ARGON PLASMA COAGULATION/BICAP) BIOPSY Patient reports pain as mild.    Objective: Vital signs in last 24 hours: Temp:  [97.6 F (36.4 C)-99.4 F (37.4 C)] 99.4 F (37.4 C) (10/10 2228) Pulse Rate:  [73-101] 101 (10/10 2228) Resp:  [11-16] 16 (10/10 2228) BP: (100-140)/(68-79) 114/68 (10/10 2228) SpO2:  [97 %-100 %] 100 % (10/10 2228) Weight:  [92.2 kg] 92.2 kg (10/10 1234)  Intake/Output from previous day: 10/10 0701 - 10/11 0700 In: 315 [Blood:315] Out: 2005  Intake/Output this shift: No intake/output data recorded.  Recent Labs    11/20/20 0721 11/21/20 0153 11/21/20 1943  HGB 7.2* 6.3* 8.4*   Recent Labs    11/21/20 0153 11/21/20 1943  WBC 7.4  --   RBC 2.27*  --   HCT 20.1* 26.6*  PLT 329  --    Recent Labs    11/21/20 0153  NA 131*  K 4.7  CL 94*  CO2 24  BUN 52*  CREATININE 10.00*  GLUCOSE 90  CALCIUM 8.2*   No results for input(s): LABPT, INR in the last 72 hours.  Neurologically intact Neurovascular intact Sensation intact distally Intact pulses distally Dorsiflexion/Plantar flexion intact Incision: dressing C/D/I No cellulitis present Compartment soft   Assessment/Plan: 4 Days Post-Op Procedure(s) (LRB): ESOPHAGOGASTRODUODENOSCOPY (EGD) (N/A) HOT HEMOSTASIS (ARGON PLASMA COAGULATION/BICAP) BIOPSY Up with therapy Up with therapy WBAT LLE Hemoglobin 8.4 yesterday post transfusion in hd. Patient asymptomatic Will need incisional vac for two weeks post-op.  Please switch to portable unit upon discharge.  Will need to f/u with Dr. Erlinda Hong next Tuesday for removal Ok from ortho to switch from asa to lovenox for dvt ppx, however upon speaking to pharmacy, they recommend aspirin.  GI ok with this.  Will cancel lovenox rx.  Pain meds and asa called in last week Stable from ortho to be d/c home once medically stable     Aundra Dubin 11/22/2020, 7:52 AM

## 2020-11-23 DIAGNOSIS — M1612 Unilateral primary osteoarthritis, left hip: Secondary | ICD-10-CM | POA: Diagnosis not present

## 2020-11-23 DIAGNOSIS — K250 Acute gastric ulcer with hemorrhage: Secondary | ICD-10-CM

## 2020-11-23 DIAGNOSIS — D631 Anemia in chronic kidney disease: Secondary | ICD-10-CM

## 2020-11-23 DIAGNOSIS — N189 Chronic kidney disease, unspecified: Secondary | ICD-10-CM

## 2020-11-23 LAB — RENAL FUNCTION PANEL
Albumin: 2.1 g/dL — ABNORMAL LOW (ref 3.5–5.0)
Anion gap: 10 (ref 5–15)
BUN: 24 mg/dL — ABNORMAL HIGH (ref 8–23)
CO2: 28 mmol/L (ref 22–32)
Calcium: 8.4 mg/dL — ABNORMAL LOW (ref 8.9–10.3)
Chloride: 95 mmol/L — ABNORMAL LOW (ref 98–111)
Creatinine, Ser: 4.78 mg/dL — ABNORMAL HIGH (ref 0.61–1.24)
GFR, Estimated: 13 mL/min — ABNORMAL LOW (ref >60)
Glucose, Bld: 123 mg/dL — ABNORMAL HIGH (ref 70–99)
Phosphorus: 2.2 mg/dL — ABNORMAL LOW (ref 2.5–4.6)
Potassium: 3.2 mmol/L — ABNORMAL LOW (ref 3.5–5.1)
Sodium: 133 mmol/L — ABNORMAL LOW (ref 135–145)

## 2020-11-23 LAB — HEMOGLOBIN AND HEMATOCRIT, BLOOD
HCT: 21.4 % — ABNORMAL LOW (ref 39.0–52.0)
Hemoglobin: 6.6 g/dL — CL (ref 13.0–17.0)

## 2020-11-23 MED ORDER — PEG 3350-KCL-NA BICARB-NACL 420 G PO SOLR
4000.0000 mL | Freq: Once | ORAL | Status: AC
  Administered 2020-11-24: 4000 mL via ORAL
  Filled 2020-11-23: qty 4000

## 2020-11-23 MED ORDER — SULFAMETHOXAZOLE-TRIMETHOPRIM 800-160 MG PO TABS: 1.0000 | ORAL_TABLET | Freq: Two times a day (BID) | ORAL | Status: DC

## 2020-11-23 MED ORDER — SULFAMETHOXAZOLE-TRIMETHOPRIM 800-160 MG PO TABS
1.0000 | ORAL_TABLET | Freq: Every day | ORAL | Status: DC
  Administered 2020-11-23 – 2020-11-24 (×2): 1 via ORAL
  Filled 2020-11-23 (×2): qty 1

## 2020-11-23 MED ORDER — ASPIRIN 81 MG PO CHEW: 81.0000 mg | CHEWABLE_TABLET | Freq: Two times a day (BID) | ORAL | 0 refills | Status: AC

## 2020-11-23 NOTE — H&P (View-Only) (Signed)
Gastroenterology Inpatient Follow Up    Subjective: - Last stools were brown. Denies melena - Had a burger for lunch, denies N&V. Denies ab pain - Hb dropped from 7.8 to 6.6  Objective: Vital signs in last 24 hours: Temp:  [98.2 F (36.8 C)-98.5 F (36.9 C)] 98.2 F (36.8 C) (10/12 1200) Pulse Rate:  [69-97] 90 (10/12 1200) Resp:  [11-23] 18 (10/12 1200) BP: (97-124)/(56-79) 117/69 (10/12 1200) SpO2:  [99 %-100 %] 100 % (10/12 1200) Weight:  [89.2 kg-91.2 kg] 89.2 kg (10/12 1110) Last BM Date: 11/22/20  Intake/Output from previous day: 10/11 0701 - 10/12 0700 In: 200 [P.O.:200] Out: 0  Intake/Output this shift: Total I/O In: 655 [P.O.:340; Blood:315] Out: 2020 [Other:2020]  General appearance: alert and cooperative Resp: no increased WOB Cardio: regular rate GI: soft, non-tender; bowel sounds normal; no masses,  no organomegaly Extremities: 1+ BLE edema  Lab Results: Recent Labs    11/21/20 0153 11/21/20 1943 11/22/20 0733 11/23/20 0210  WBC 7.4  --   --   --   HGB 6.3* 8.4* 7.8* 6.6*  HCT 20.1* 26.6* 25.0* 21.4*  PLT 329  --   --   --    BMET Recent Labs    11/21/20 0153 11/23/20 0903  NA 131* 133*  K 4.7 3.2*  CL 94* 95*  CO2 24 28  GLUCOSE 90 123*  BUN 52* 24*  CREATININE 10.00* 4.78*  CALCIUM 8.2* 8.4*   LFT Recent Labs    11/23/20 0903  ALBUMIN 2.1*   PT/INR No results for input(s): LABPROT, INR in the last 72 hours. Hepatitis Panel No results for input(s): HEPBSAG, HCVAB, HEPAIGM, HEPBIGM in the last 72 hours. C-Diff No results for input(s): CDIFFTOX in the last 72 hours.  Studies/Results: No results found.  Medications: Scheduled:  (feeding supplement) PROSource Plus  30 mL Oral BID BM   sodium chloride   Intravenous Once   sodium chloride   Intravenous Once   sodium chloride   Intravenous Once   aspirin  81 mg Oral BID   atorvastatin  10 mg Oral QPM   camphor-menthol   Topical BID   Chlorhexidine Gluconate Cloth  6  each Topical Q0600   cinacalcet  120 mg Oral Q supper   darbepoetin (ARANESP) injection - DIALYSIS  200 mcg Intravenous Q Wed-HD   docusate sodium  100 mg Oral BID   midodrine  10 mg Oral Q M,W,F   oxyCODONE  10 mg Oral Q12H   pantoprazole  40 mg Oral BID   polyethylene glycol  17 g Oral Daily   [START ON 11/24/2020] polyethylene glycol-electrolytes  4,000 mL Oral Once   sevelamer carbonate  1,600 mg Oral With snacks   sevelamer carbonate  4,000 mg Oral TID with meals   sucralfate  1 g Oral TID WC & HS   topiramate  25 mg Oral BID   Continuous:  methocarbamol (ROBAXIN) IV     FMB:[id number], alum & mag hydroxide-simeth, diphenhydrAMINE, docusate sodium, HYDROmorphone (DILAUDID) injection, menthol-cetylpyridinium **OR** phenol, methocarbamol **OR** methocarbamol (ROBAXIN) IV, metoCLOPramide **OR** metoCLOPramide (REGLAN) injection, ondansetron **OR** ondansetron (ZOFRAN) IV, oxyCODONE, oxyCODONE, sorbitol  Assessment/Plan: 60 year old male with history of ESRD on HD, DM2 , OSA, CVA, partial colectomy for inflammatory mass, GERD, PUD presented for left hip surgery. We are consulted for anemia and melena. EGD 10/7 w/pyloric channel ulcer with clot s/p bipolar, biopsies showed esophagitis, gastritis and duodenitis. He was instructed to take PPI and sucralfate. Patient has  continued to have a downward drift of his hemoglobin requiring blood transfusions. Thus we will plan for colonoscopy for further evaluation. Patient's last colonoscopy was in 07/2015 that showed an inflammatory mass in the proximal ascending colon.  - Colonoscopy on Friday since patient ate today. Will plan for Golytely prep. CLD starting tomorrow morning.   LOS: 7 days   Sharyn Creamer 11/23/2020, 4:06 PM

## 2020-11-23 NOTE — Progress Notes (Signed)
Physical Therapy Treatment Patient Details Name: Carl Presley Sr. MRN: [id number] DOB: February 02, 1961 Today's Date: 11/23/2020   History of Present Illness Patient is a 60 y/o male who presents s/p left THA 11/14/20 direct anterior approach. PMH includes TIA, ESRD, DM, HTN, Rt TSA, depression, anxiety, stroke, seizures.    PT Comments    Pt tolerates treatment well, ambulating for increased distances and progressing to stair negotiation at this time. Pt requires no physical assistance at this time and demonstrates good safety awareness when mobilizing currently. Pt is encouraged to continue ambulating multiple times daily to aide in a return to independent mobility. PT recommends discharge home with HHPT.   Recommendations for follow up therapy are one component of a multi-disciplinary discharge planning process, led by the attending physician.  Recommendations may be updated based on patient status, additional functional criteria and insurance authorization.  Follow Up Recommendations  Supervision for mobility/OOB;Home health PT     Equipment Recommendations  Rolling walker with 5" wheels;3in1 (PT) (already delivered)    Recommendations for Other Services       Precautions / Restrictions Precautions Precautions: Fall;Other (comment) Precaution Comments: wound vac Restrictions Weight Bearing Restrictions: Yes LLE Weight Bearing: Weight bearing as tolerated     Mobility  Bed Mobility Overal bed mobility: Modified Independent Bed Mobility: Supine to Sit     Supine to sit: Modified independent (Device/Increase time)          Transfers Overall transfer level: Needs assistance Equipment used: Rolling walker (2 wheeled) Transfers: Sit to/from Stand Sit to Stand: Supervision            Ambulation/Gait Ambulation/Gait assistance: Supervision Gait Distance (Feet): 200 Feet (200' x 2) Assistive device: Rolling walker (2 wheeled) Gait Pattern/deviations: Step-through  pattern Gait velocity: functional Gait velocity interpretation: 1.31 - 2.62 ft/sec, indicative of limited community ambulator General Gait Details: pt with steady step-through gait, mild reduction in stance time on LLE initially which improves with increased gait distances   Stairs Stairs: Yes Stairs assistance: Min guard Stair Management: One rail Right;With walker (walker utilized for unilateral UE support to simulate cane) Number of Stairs: 2     Wheelchair Mobility    Modified Rankin (Stroke Patients Only)       Balance Overall balance assessment: Needs assistance Sitting-balance support: No upper extremity supported;Feet supported Sitting balance-Leahy Scale: Good     Standing balance support: No upper extremity supported Standing balance-Leahy Scale: Fair Standing balance comment: Able to stand statically without UE support but does better with UE support during walking.                            Cognition Arousal/Alertness: Awake/alert Behavior During Therapy: WFL for tasks assessed/performed Overall Cognitive Status: Within Functional Limits for tasks assessed                                        Exercises      General Comments General comments (skin integrity, edema, etc.): VSS on RA      Pertinent Vitals/Pain Pain Assessment: Faces Faces Pain Scale: No hurt    Home Living                      Prior Function            PT Goals (current goals can now be found  in the care plan section) Acute Rehab PT Goals Patient Stated Goal: go home when ready Progress towards PT goals: Progressing toward goals    Frequency    7X/week      PT Plan Current plan remains appropriate    Co-evaluation              AM-PAC PT "6 Clicks" Mobility   Outcome Measure  Help needed turning from your back to your side while in a flat bed without using bedrails?: None Help needed moving from lying on your back to  sitting on the side of a flat bed without using bedrails?: None Help needed moving to and from a bed to a chair (including a wheelchair)?: A Little Help needed standing up from a chair using your arms (e.g., wheelchair or bedside chair)?: A Little Help needed to walk in hospital room?: A Little Help needed climbing 3-5 steps with a railing? : A Little 6 Click Score: 20    End of Session Equipment Utilized During Treatment: Gait belt Activity Tolerance: Patient tolerated treatment well Patient left: in chair;with call bell/phone within reach Nurse Communication: Mobility status PT Visit Diagnosis: Pain;Muscle weakness (generalized) (M62.81);Difficulty in walking, not elsewhere classified (R26.2);History of falling (Z91.81) Pain - Right/Left: Left Pain - part of body: Hip;Knee     Time: 2376-2831 PT Time Calculation (min) (ACUTE ONLY): 23 min  Charges:  $Gait Training: 23-37 mins                     Zenaida Niece, PT, DPT Acute Rehabilitation Pager: [phone]    Zenaida Niece 11/23/2020, 3:20 PM

## 2020-11-23 NOTE — Progress Notes (Signed)
Gastroenterology Inpatient Follow Up    Subjective: - Last stools were brown. Denies melena - Had a burger for lunch, denies N&V. Denies ab pain - Hb dropped from 7.8 to 6.6  Objective: Vital signs in last 24 hours: Temp:  [98.2 F (36.8 C)-98.5 F (36.9 C)] 98.2 F (36.8 C) (10/12 1200) Pulse Rate:  [69-97] 90 (10/12 1200) Resp:  [11-23] 18 (10/12 1200) BP: (97-124)/(56-79) 117/69 (10/12 1200) SpO2:  [99 %-100 %] 100 % (10/12 1200) Weight:  [89.2 kg-91.2 kg] 89.2 kg (10/12 1110) Last BM Date: 11/22/20  Intake/Output from previous day: 10/11 0701 - 10/12 0700 In: 200 [P.O.:200] Out: 0  Intake/Output this shift: Total I/O In: 655 [P.O.:340; Blood:315] Out: 2020 [Other:2020]  General appearance: alert and cooperative Resp: no increased WOB Cardio: regular rate GI: soft, non-tender; bowel sounds normal; no masses,  no organomegaly Extremities: 1+ BLE edema  Lab Results: Recent Labs    11/21/20 0153 11/21/20 1943 11/22/20 0733 11/23/20 0210  WBC 7.4  --   --   --   HGB 6.3* 8.4* 7.8* 6.6*  HCT 20.1* 26.6* 25.0* 21.4*  PLT 329  --   --   --    BMET Recent Labs    11/21/20 0153 11/23/20 0903  NA 131* 133*  K 4.7 3.2*  CL 94* 95*  CO2 24 28  GLUCOSE 90 123*  BUN 52* 24*  CREATININE 10.00* 4.78*  CALCIUM 8.2* 8.4*   LFT Recent Labs    11/23/20 0903  ALBUMIN 2.1*   PT/INR No results for input(s): LABPROT, INR in the last 72 hours. Hepatitis Panel No results for input(s): HEPBSAG, HCVAB, HEPAIGM, HEPBIGM in the last 72 hours. C-Diff No results for input(s): CDIFFTOX in the last 72 hours.  Studies/Results: No results found.  Medications: Scheduled:  (feeding supplement) PROSource Plus  30 mL Oral BID BM   sodium chloride   Intravenous Once   sodium chloride   Intravenous Once   sodium chloride   Intravenous Once   aspirin  81 mg Oral BID   atorvastatin  10 mg Oral QPM   camphor-menthol   Topical BID   Chlorhexidine Gluconate Cloth  6  each Topical Q0600   cinacalcet  120 mg Oral Q supper   darbepoetin (ARANESP) injection - DIALYSIS  200 mcg Intravenous Q Wed-HD   docusate sodium  100 mg Oral BID   midodrine  10 mg Oral Q M,W,F   oxyCODONE  10 mg Oral Q12H   pantoprazole  40 mg Oral BID   polyethylene glycol  17 g Oral Daily   [START ON 11/24/2020] polyethylene glycol-electrolytes  4,000 mL Oral Once   sevelamer carbonate  1,600 mg Oral With snacks   sevelamer carbonate  4,000 mg Oral TID with meals   sucralfate  1 g Oral TID WC & HS   topiramate  25 mg Oral BID   Continuous:  methocarbamol (ROBAXIN) IV     WHQ:[id number], alum & mag hydroxide-simeth, diphenhydrAMINE, docusate sodium, HYDROmorphone (DILAUDID) injection, menthol-cetylpyridinium **OR** phenol, methocarbamol **OR** methocarbamol (ROBAXIN) IV, metoCLOPramide **OR** metoCLOPramide (REGLAN) injection, ondansetron **OR** ondansetron (ZOFRAN) IV, oxyCODONE, oxyCODONE, sorbitol  Assessment/Plan: 60 year old male with history of ESRD on HD, DM2 , OSA, CVA, partial colectomy for inflammatory mass, GERD, PUD presented for left hip surgery. We are consulted for anemia and melena. EGD 10/7 w/pyloric channel ulcer with clot s/p bipolar, biopsies showed esophagitis, gastritis and duodenitis. He was instructed to take PPI and sucralfate. Patient has  continued to have a downward drift of his hemoglobin requiring blood transfusions. Thus we will plan for colonoscopy for further evaluation. Patient's last colonoscopy was in 07/2015 that showed an inflammatory mass in the proximal ascending colon.  - Colonoscopy on Friday since patient ate today. Will plan for Golytely prep. CLD starting tomorrow morning.   LOS: 7 days   Sharyn Creamer 11/23/2020, 4:06 PM

## 2020-11-23 NOTE — Progress Notes (Addendum)
PROGRESS NOTE   Carl Farrior Sr.  IRC:[id number] DOB: [birth year] DOA: 11/14/2020 PCP: [name], MD  Brief Narrative:  60 year old home dwelling black male [place] will occasionally accept blood Prior right reverse total shoulder arthroplasty 08/31/2020 ESRD MWF on HD, prior lacunar CVA, prior upper GI bleed-EGD superficial erythematous mucosa pyloric channel no stigmata of bleeding-was taken off of NSAIDs at that time Electively admitted by Dr. Beatriz Chancellor 11/14/2020 for left total hip anterior arthroplasty hospitalization complicated by Anemia with hemoglobin 5.4 positive FOBT GI consulted and performed endoscopy   Hospital-Problem based course  Acute GI bleed Endoscopy 11/18/2020 = pyloric channel ulcer with visible vessel + hiatal hernia Continuing PPI twice daily X 1 month, Carafate 1 g 4 times daily and repeat endoscopy 8 weeks Now status post 3 to 4 units of blood Will discuss with both GI and orthopedics what type of DVT prophylaxis to go home on as is on aspirin 81 twice daily currently Patient is allergic to IV iron despite TSAT being low so continue Aranesp every 2 weekly Left total hip arthroplasty Dr. Sherrian Divers 10/3 Stable at this time, keep incisional VAC for 2 weeks postop switch to portable unit on discharge, WBAT LLE Pain control Oxy IR 5-10 every 4 as needed, 10-15 for pain score 7-10 and continue OxyContin 10 mg every 12 Can continue Robaxin twice daily if needed in the outpatient setting ESRD MWF Management deferred to nephrology Continue midodrine as per orders, continue Renvela 4 g 3 times daily meals along with as needed, Sensipar 120 Prior lacunar infarct Transition back to aspirin once daily after 21 days continue Lipitor Hypotension in setting of chronic dialysis Continue midodrine 10 3 times daily    DVT prophylaxis: Aspirin 81 twice daily, SCD Code Status: Full CODE STATUS Family Communication:  Disposition:  Status is: Inpatient  Remains inpatient  appropriate because:Hemodynamically unstable and Unsafe d/c plan  Dispo:  Patient From:  Home  Planned Disposition:  Home with Health Care Svc  Medically stable for discharge:  Yes          Consultants:  Gastroenterology Dr. Lorenso Courier Orthopedics Dr. Erlinda Hong  Procedures:    Antimicrobials:     Subjective: Awake coherent seen on HD unit feels well Has questions about his discharge wondering what his blood numbers are  Objective: Vitals:   11/23/20 1000 11/23/20 1030 11/23/20 1110 11/23/20 1200  BP: 124/75 122/72 (!) 122/56 117/69  Pulse: 73 70 78 90  Resp:   11 18  Temp:    98.2 F (36.8 C)  TempSrc:   Oral Oral  SpO2:   100% 100%  Weight:   89.2 kg   Height:        Intake/Output Summary (Last 24 hours) at 11/23/2020 1212 Last data filed at 11/23/2020 1100 Gross per 24 hour  Intake 315 ml  Output 2020 ml  Net -1705 ml   Filed Weights   11/21/20 1234 11/23/20 0714 11/23/20 1110  Weight: 92.2 kg 91.2 kg 89.2 kg    Examination:  EOMI NCAT no focal deficit looks younger than stated age No distress S1-S2 no murmur Chest clear no rales rhonchi Fistula noted ROM intact no focal deficit Abdomen soft nontender no rebound no guarding Psych euthymic congruent  Data Reviewed: personally reviewed   CBC    Component Value Date/Time   WBC 7.4 11/21/2020 0153   RBC 2.27 (L) 11/21/2020 0153   HGB 6.6 (LL) 11/23/2020 0210   HCT 21.4 (L) 11/23/2020 0210   PLT 329 11/21/2020  0153   MCV 88.5 11/21/2020 0153   MCH 27.8 11/21/2020 0153   MCHC 31.3 11/21/2020 0153   RDW 16.2 (H) 11/21/2020 0153   LYMPHSABS 0.5 (L) 11/15/2020 0033   MONOABS 0.6 11/15/2020 0033   EOSABS 0.0 11/15/2020 0033   BASOSABS 0.0 11/15/2020 0033   CMP Latest Ref Rng & Units 11/23/2020 11/21/2020 11/18/2020  Glucose 70 - 99 mg/dL 123(H) 90 80  BUN 8 - 23 mg/dL 24(H) 52(H) 43(H)  Creatinine 0.61 - 1.24 mg/dL 4.78(H) 10.00(H) 9.07(H)  Sodium 135 - 145 mmol/L 133(L) 131(L) 133(L)  Potassium  3.5 - 5.1 mmol/L 3.2(L) 4.7 4.4  Chloride 98 - 111 mmol/L 95(L) 94(L) 95(L)  CO2 22 - 32 mmol/L 28 24 24   Calcium 8.9 - 10.3 mg/dL 8.4(L) 8.2(L) 8.2(L)  Total Protein 6.5 - 8.1 g/dL - - -  Total Bilirubin 0.3 - 1.2 mg/dL - - -  Alkaline Phos 38 - 126 U/L - - -  AST 15 - 41 U/L - - -  ALT 0 - 44 U/L - - -     Radiology Studies: No results found.   Scheduled Meds:  (feeding supplement) PROSource Plus  30 mL Oral BID BM   sodium chloride   Intravenous Once   sodium chloride   Intravenous Once   sodium chloride   Intravenous Once   aspirin  81 mg Oral BID   atorvastatin  10 mg Oral QPM   camphor-menthol   Topical BID   Chlorhexidine Gluconate Cloth  6 each Topical Q0600   cinacalcet  120 mg Oral Q supper   darbepoetin (ARANESP) injection - DIALYSIS  200 mcg Intravenous Q Wed-HD   docusate sodium  100 mg Oral BID   midodrine  10 mg Oral Q M,W,F   oxyCODONE  10 mg Oral Q12H   pantoprazole  40 mg Oral BID   polyethylene glycol  17 g Oral Daily   sevelamer carbonate  1,600 mg Oral With snacks   sevelamer carbonate  4,000 mg Oral TID with meals   sucralfate  1 g Oral TID WC & HS   topiramate  25 mg Oral BID   Continuous Infusions:  methocarbamol (ROBAXIN) IV       LOS: 7 days   Time spent: [place], MD [facility] To contact the attending provider between 7A-7P or the covering provider during after hours 7P-7A, please log into the web site [email or web address] and access using universal [place] password for that web site. If you do not have the password, please call the hospital operator.  11/23/2020, 12:12 PM

## 2020-11-23 NOTE — Progress Notes (Signed)
[facility] Progress Note   Subjective: Seen on HD. Tolerating treatment. Hgb 6.6 this am, received 1u prbc with hd.   Objective Vitals:   11/23/20 0926 11/23/20 0930 11/23/20 1000 11/23/20 1030  BP: 113/66 (!) 98/57 124/75 122/72  Pulse: 73 78 73 70  Resp: (!) 23     Temp: 98.2 F (36.8 C)     TempSrc: Oral     SpO2: 99%     Weight:      Height:       Physical Exam General: NAD Heart: S1,S2 RRR No M/R/G Lungs: CTAB A/P Abdomen: Soft, NABS Extremities: No edema. Neuro: awake, alert Dialysis Access: R AVF aneurysmal +T/B   Additional Objective Labs: Basic Metabolic Panel: Recent Labs  Lab 11/18/20 0800 11/21/20 0153 11/23/20 0903  NA 133* 131* 133*  K 4.4 4.7 3.2*  CL 95* 94* 95*  CO2 24 24 28   GLUCOSE 80 90 123*  BUN 43* 52* 24*  CREATININE 9.07* 10.00* 4.78*  CALCIUM 8.2* 8.2* 8.4*  PHOS 4.2 4.1 2.2*   Liver Function Tests: Recent Labs  Lab 11/18/20 0800 11/21/20 0153 11/23/20 0903  ALBUMIN 2.1* 1.9* 2.1*   No results for input(s): LIPASE, AMYLASE in the last 168 hours. CBC: Recent Labs  Lab 11/16/20 2118 11/17/20 1345 11/18/20 0211 11/19/20 0100 11/21/20 0153 11/21/20 1943 11/22/20 0733 11/23/20 0210  WBC 10.1 9.8 6.8  --  7.4  --   --   --   HGB 7.4* 7.3* 6.7*   < > 6.3* 8.4* 7.8* 6.6*  HCT 23.3* 22.8* 20.9*   < > 20.1* 26.6* 25.0* 21.4*  MCV 87.3 88.4 87.1  --  88.5  --   --   --   PLT 259 318 233  --  329  --   --   --    < > = values in this interval not displayed.   Blood Culture    Component Value Date/Time   SDES BLOOD LEFT HAND 08/01/2015 0624   SPECREQUEST BOTTLES DRAWN AEROBIC AND ANAEROBIC  5CC 08/01/2015 0624   CULT NO GROWTH 5 DAYS 08/01/2015 0624   REPTSTATUS 08/06/2015 FINAL 08/01/2015 0624    Cardiac Enzymes: No results for input(s): CKTOTAL, CKMB, CKMBINDEX, TROPONINI in the last 168 hours. CBG: No results for input(s): GLUCAP in the last 168 hours. Iron Studies: No results for input(s): IRON,  TIBC, TRANSFERRIN, FERRITIN in the last 72 hours. @lablastinr3 @ Studies/Results: No results found. Medications:  methocarbamol (ROBAXIN) IV      (feeding supplement) PROSource Plus  30 mL Oral BID BM   sodium chloride   Intravenous Once   sodium chloride   Intravenous Once   sodium chloride   Intravenous Once   aspirin  81 mg Oral BID   atorvastatin  10 mg Oral QPM   camphor-menthol   Topical BID   Chlorhexidine Gluconate Cloth  6 each Topical Q0600   cinacalcet  120 mg Oral Q supper   darbepoetin (ARANESP) injection - DIALYSIS  200 mcg Intravenous Q Wed-HD   docusate sodium  100 mg Oral BID   midodrine  10 mg Oral Q M,W,F   oxyCODONE  10 mg Oral Q12H   pantoprazole  40 mg Oral BID   polyethylene glycol  17 g Oral Daily   sevelamer carbonate  1,600 mg Oral With snacks   sevelamer carbonate  4,000 mg Oral TID with meals   sucralfate  1 g Oral TID WC & HS   topiramate  25 mg Oral BID     Dialysis Orders: MWF at [facility], 400/500, EDW 88kg, 2K/2.5Ca, AVF - heparin 2000 units IV TIW - Mircera 122mcg IV q 2 weeks (last 9/21 for Hgb 7.7)   Assessment/Plan:  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  ESRD:  Continue HD per MWF schedule - Next HD 11/25/2020 if still here  BP/volume: BP controlled, no edema on exam. Uses midodrine for HD.  Anemia (ESRD + GIB): Hgb down to 5.4, agreed for 1U PRBCs, given on 10/5. FOBT positive. S/p Aranesp 229mcg on 10/4. Tsat low - allergy to IV iron listed in chart. Hgb trending down again. GI consulted. 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 Patient is Je[employer] Witness but has agreed to transfusion if needed. Hgb stable today, s/p 1u prbc 10/10, another 1u prbc- hgb 6.6 today  Metabolic bone disease: Ca/Phos good - continue home binders/sensipar. Nutrition: Alb low, push protein.  T2DM-per primary   Hx CVA  Gean Quint, MD [facility]

## 2020-11-23 NOTE — Progress Notes (Signed)
Patient's hemoglobin continues to be unstable.  Plan for colonoscopy on Friday.  Left hip surgical site has incisional VAC with good seal and no drainage in the canister.  [name] is actually ambulating fairly well in his room and hallway up to 200 feet.  He is very happy with his recovery from the hip replacement so far.  He has no complaints.

## 2020-11-24 DIAGNOSIS — M1612 Unilateral primary osteoarthritis, left hip: Secondary | ICD-10-CM | POA: Diagnosis not present

## 2020-11-24 LAB — TYPE AND SCREEN
ABO/RH(D): O POS
Antibody Screen: NEGATIVE
Unit division: 0
Unit division: 0

## 2020-11-24 LAB — CBC WITH DIFFERENTIAL/PLATELET
Abs Immature Granulocytes: 0.05 10*3/uL (ref 0.00–0.07)
Basophils Absolute: 0.1 10*3/uL (ref 0.0–0.1)
Basophils Relative: 1 %
Eosinophils Absolute: 0.6 10*3/uL — ABNORMAL HIGH (ref 0.0–0.5)
Eosinophils Relative: 8 %
HCT: 23.8 % — ABNORMAL LOW (ref 39.0–52.0)
Hemoglobin: 7.6 g/dL — ABNORMAL LOW (ref 13.0–17.0)
Immature Granulocytes: 1 %
Lymphocytes Relative: 12 %
Lymphs Abs: 0.9 10*3/uL (ref 0.7–4.0)
MCH: 28.5 pg (ref 26.0–34.0)
MCHC: 31.9 g/dL (ref 30.0–36.0)
MCV: 89.1 fL (ref 80.0–100.0)
Monocytes Absolute: 1.3 10*3/uL — ABNORMAL HIGH (ref 0.1–1.0)
Monocytes Relative: 16 %
Neutro Abs: 4.7 10*3/uL (ref 1.7–7.7)
Neutrophils Relative %: 62 %
Platelets: 333 10*3/uL (ref 150–400)
RBC: 2.67 MIL/uL — ABNORMAL LOW (ref 4.22–5.81)
RDW: 15.5 % (ref 11.5–15.5)
WBC: 7.6 10*3/uL (ref 4.0–10.5)
nRBC: 0 % (ref 0.0–0.2)

## 2020-11-24 LAB — BPAM RBC
Blood Product Expiration Date: 202210172359
Blood Product Expiration Date: 202211032359
ISSUE DATE / TIME: 202210101145
ISSUE DATE / TIME: 202210120842
Unit Type and Rh: 5100
Unit Type and Rh: 5100

## 2020-11-24 LAB — RENAL FUNCTION PANEL
Albumin: 1.9 g/dL — ABNORMAL LOW (ref 3.5–5.0)
Anion gap: 9 (ref 5–15)
BUN: 26 mg/dL — ABNORMAL HIGH (ref 8–23)
CO2: 30 mmol/L (ref 22–32)
Calcium: 8.3 mg/dL — ABNORMAL LOW (ref 8.9–10.3)
Chloride: 94 mmol/L — ABNORMAL LOW (ref 98–111)
Creatinine, Ser: 5.6 mg/dL — ABNORMAL HIGH (ref 0.61–1.24)
GFR, Estimated: 11 mL/min — ABNORMAL LOW (ref >60)
Glucose, Bld: 95 mg/dL (ref 70–99)
Phosphorus: 3.3 mg/dL (ref 2.5–4.6)
Potassium: 4.1 mmol/L (ref 3.5–5.1)
Sodium: 133 mmol/L — ABNORMAL LOW (ref 135–145)

## 2020-11-24 NOTE — Progress Notes (Signed)
Physical Therapy Treatment Patient Details Name: Carl Whaling Sr. MRN: [id number] DOB: [birth year] Today's Date: 11/24/2020   History of Present Illness Patient is a 60 y/o male who presents s/p left THA 11/14/20 direct anterior approach. PMH includes TIA, ESRD, DM, HTN, Rt TSA, depression, anxiety, stroke, seizures.    PT Comments    Patient received in bathroom finishing up bathing, pleasant and cooperative. Able to complete all self care tasks this morning with Mod(I)/very distant S. Tolerated progression of gait distance today, however continues to have antalgic gait pattern; cues provided for using UEs to offload painful joint. Continues to have very poor eccentric stand to sit control- will really need to practice this moving forward. Feels comfortable with exercises and politely declined practice today. Left up in recliner with all needs met.  Will continue to follow while he remains in house.    Recommendations for follow up therapy are one component of a multi-disciplinary discharge planning process, led by the attending physician.  Recommendations may be updated based on patient status, additional functional criteria and insurance authorization.  Follow Up Recommendations  Supervision for mobility/OOB;Home health PT     Equipment Recommendations  Rolling walker with 5" wheels;3in1 (PT) (already delivered)    Recommendations for Other Services       Precautions / Restrictions Precautions Precautions: Fall;Other (comment) Precaution Comments: wound vac Restrictions Weight Bearing Restrictions: Yes LLE Weight Bearing: Weight bearing as tolerated Other Position/Activity Restrictions: direct anterior     Mobility  Bed Mobility               General bed mobility comments: in bathroom upon PT arrival    Transfers Overall transfer level: Needs assistance Equipment used: Rolling walker (2 wheeled) Transfers: Sit to/from Stand Sit to Stand: Supervision          General transfer comment: S for safety, continues to have difficulty with eccentric control with stand to sit, also needed cues/verbal reminders for hand placement  Ambulation/Gait Ambulation/Gait assistance: Supervision Gait Distance (Feet): 400 Feet Assistive device: Rolling walker (2 wheeled) Gait Pattern/deviations: Step-through pattern;Antalgic Gait velocity: functional   General Gait Details: doing great with step through gait pattern, more antalgic today and needed cues to offload using BUEs on RW, but steady   Stairs             Wheelchair Mobility    Modified Rankin (Stroke Patients Only)       Balance Overall balance assessment: Needs assistance Sitting-balance support: No upper extremity supported;Feet supported Sitting balance-Leahy Scale: Good     Standing balance support: No upper extremity supported Standing balance-Leahy Scale: Fair Standing balance comment: Able to stand statically without UE support but does better with UE support during walking.                            Cognition Arousal/Alertness: Awake/alert Behavior During Therapy: WFL for tasks assessed/performed Overall Cognitive Status: Within Functional Limits for tasks assessed                                 General Comments: pt up in bathroom bathing when PT arrived      Exercises      General Comments        Pertinent Vitals/Pain Pain Assessment: Faces Faces Pain Scale: Hurts a little bit Pain Location: L hip Pain Descriptors / Indicators: Operative site guarding;Sore Pain Intervention(s):  Limited activity within patient's tolerance;Monitored during session    Home Living                      Prior Function            PT Goals (current goals can now be found in the care plan section) Acute Rehab PT Goals Patient Stated Goal: go home when ready PT Goal Formulation: With patient Time For Goal Achievement: 11/29/20 Potential to  Achieve Goals: Good Progress towards PT goals: Progressing toward goals    Frequency    7X/week      PT Plan Current plan remains appropriate    Co-evaluation              AM-PAC PT "6 Clicks" Mobility   Outcome Measure  Help needed turning from your back to your side while in a flat bed without using bedrails?: None Help needed moving from lying on your back to sitting on the side of a flat bed without using bedrails?: None Help needed moving to and from a bed to a chair (including a wheelchair)?: A Little Help needed standing up from a chair using your arms (e.g., wheelchair or bedside chair)?: A Little Help needed to walk in hospital room?: A Little Help needed climbing 3-5 steps with a railing? : A Little 6 Click Score: 20    End of Session   Activity Tolerance: Patient tolerated treatment well Patient left: in chair;with call bell/phone within reach Nurse Communication: Mobility status PT Visit Diagnosis: Pain;Muscle weakness (generalized) (M62.81);Difficulty in walking, not elsewhere classified (R26.2);History of falling (Z91.81) Pain - Right/Left: Left Pain - part of body: Hip;Knee     Time: 5258-9483 PT Time Calculation (min) (ACUTE ONLY): 21 min  Charges:  $Gait Training: 8-22 mins                    Windell [place], DPT, PN2   Supplemental Physical Therapist [place]    Pager [phone] Acute Rehab Office [phone]

## 2020-11-24 NOTE — Progress Notes (Signed)
Occupational Therapy Treatment Patient Details Name: Carl Farve Sr. MRN: [id number] DOB: February 28, 1960 Today's Date: 11/24/2020   History of present illness Patient is a 60 y/o male who presents s/p left THA 11/14/20 direct anterior approach. PMH includes TIA, ESRD, DM, HTN, Rt TSA, depression, anxiety, stroke, seizures.   OT comments  Pt is making progress towards OT goals. Pt completed simulated shower transfers in bathroom with supervision. Educated on compensatory techniques to complete shower transfers and navigate bathroom at home, verbalizing understanding. Will continue to follow acutely.   Recommendations for follow up therapy are one component of a multi-disciplinary discharge planning process, led by the attending physician.  Recommendations may be updated based on patient status, additional functional criteria and insurance authorization.    Follow Up Recommendations  No OT follow up    Equipment Recommendations  3 in 1 bedside commode    Recommendations for Other Services      Precautions / Restrictions Precautions Precautions: Fall;Other (comment) Precaution Comments: wound vac Restrictions Weight Bearing Restrictions: Yes LLE Weight Bearing: Weight bearing as tolerated Other Position/Activity Restrictions: direct anterior       Mobility Bed Mobility               General bed mobility comments: in bathroom upon PT arrival    Transfers Overall transfer level: Needs assistance Equipment used: Rolling walker (2 wheeled) Transfers: Sit to/from Stand Sit to Stand: Supervision         General transfer comment: S for safety, continues to have difficulty with eccentric control with stand to sit, also needed cues/verbal reminders for hand placement    Balance Overall balance assessment: Needs assistance Sitting-balance support: No upper extremity supported;Feet supported Sitting balance-Leahy Scale: Good     Standing balance support: No upper  extremity supported Standing balance-Leahy Scale: Fair Standing balance comment: Able to stand statically without UE support but does better with UE support during walking.                           ADL either performed or assessed with clinical judgement   ADL Overall ADL's : Needs assistance/impaired                                 Tub/ Shower Transfer: Supervision/safety;3 in Mudlogger Details (indicate cue type and reason): Simulated shower transfer in room. Pt described bathroom environment and OT mad recommendations for safety/ease. Pt verbalized understanding.         Vision       Perception     Praxis      Cognition Arousal/Alertness: Awake/alert Behavior During Therapy: WFL for tasks assessed/performed Overall Cognitive Status: Within Functional Limits for tasks assessed                                 General Comments: pt up in bathroom bathing when PT arrived        Exercises     Shoulder Instructions       General Comments Pt reported that he had stood at sink with RW to shave, wash face, brush teeth, and bathe this morning. Pt said he had no issues doing this without assistance.    Pertinent Vitals/ Pain       Pain Assessment: Faces Faces Pain Scale: Hurts a little bit Pain Location: L  hip Pain Descriptors / Indicators: Operative site guarding;Sore Pain Intervention(s): Limited activity within patient's tolerance;Monitored during session  Home Living                                          Prior Functioning/Environment              Frequency  Min 2X/week        Progress Toward Goals  OT Goals(current goals can now be found in the care plan section)  Progress towards OT goals: Progressing toward goals  Acute Rehab OT Goals Patient Stated Goal: go home when ready OT Goal Formulation: With patient Time For Goal Achievement: 12/01/20 Potential to  Achieve Goals: Good ADL Goals Pt Will Perform Lower Body Dressing: with min guard assist;sit to/from stand Pt Will Transfer to Toilet: with modified independence;grab bars Pt Will Perform Tub/Shower Transfer: Shower transfer;with modified independence;3 in 1;rolling walker  Plan Discharge plan remains appropriate;Frequency remains appropriate    Co-evaluation                 AM-PAC OT "6 Clicks" Daily Activity     Outcome Measure   Help from another person eating meals?: None Help from another person taking care of personal grooming?: None Help from another person toileting, which includes using toliet, bedpan, or urinal?: A Little Help from another person bathing (including washing, rinsing, drying)?: A Little Help from another person to put on and taking off regular upper body clothing?: None Help from another person to put on and taking off regular lower body clothing?: A Little 6 Click Score: 21    End of Session Equipment Utilized During Treatment: Rolling walker  OT Visit Diagnosis: Unsteadiness on feet (R26.81);Muscle weakness (generalized) (M62.81);Pain Pain - Right/Left: Left Pain - part of body: Hip   Activity Tolerance Patient tolerated treatment well   Patient Left in chair;with call bell/phone within reach   Nurse Communication Mobility status        Time: 0093-8182 OT Time Calculation (min): 21 min  Charges: OT General Charges $OT Visit: 1 Visit OT Treatments $Self Care/Home Management : 8-22 mins  Diem Pagnotta C, OT/L  Acute Rehab [place] 11/24/2020, 3:24 PM

## 2020-11-24 NOTE — Progress Notes (Signed)
PT Cancellation Note  Patient Details Name: Carl Karczewski Sr. MRN: [id number] DOB: 05-30-1960   Cancelled Treatment:    Reason Eval/Treat Not Completed: Patient declined, no reason specified Politely declines this morning- did not rest well last night and would like to try to sleep a little more, agreeable to PT returning later in AM.   Ann Lions PT, DPT, PN2   Supplemental Physical Therapist [place]    Pager [phone] Acute Rehab Office [phone]

## 2020-11-24 NOTE — Progress Notes (Signed)
[facility] Progress Note   Subjective: seen in room, tolerated HD yesterday net uf ~2L. Feels tired, didn't sleep well otherwise no other complaints/acute events. Colonoscopy tomorrow  Objective Vitals:   11/23/20 1110 11/23/20 1200 11/23/20 2055 11/24/20 0751  BP: (!) 122/56 117/69 119/80 115/79  Pulse: 78 90 91 78  Resp: 11 18 17 17   Temp:  98.2 F (36.8 C) 98.6 F (37 C) 98.3 F (36.8 C)  TempSrc: Oral Oral Oral Oral  SpO2: 100% 100% 100% 100%  Weight: 89.2 kg     Height:       Physical Exam General: NAD Heart: S1,S2 RRR No M/R/G Lungs: CTAB A/P Abdomen: Soft, NABS Extremities: No edema. Neuro: awake, alert Dialysis Access: R AVF aneurysmal +T/B   Additional Objective Labs: Basic Metabolic Panel: Recent Labs  Lab 11/21/20 0153 11/23/20 0903 11/24/20 0242  NA 131* 133* 133*  K 4.7 3.2* 4.1  CL 94* 95* 94*  CO2 24 28 30   GLUCOSE 90 123* 95  BUN 52* 24* 26*  CREATININE 10.00* 4.78* 5.60*  CALCIUM 8.2* 8.4* 8.3*  PHOS 4.1 2.2* 3.3   Liver Function Tests: Recent Labs  Lab 11/21/20 0153 11/23/20 0903 11/24/20 0242  ALBUMIN 1.9* 2.1* 1.9*   No results for input(s): LIPASE, AMYLASE in the last 168 hours. CBC: Recent Labs  Lab 11/17/20 1345 11/18/20 0211 11/19/20 0100 11/21/20 0153 11/21/20 1943 11/22/20 0733 11/23/20 0210 11/24/20 0242  WBC 9.8 6.8  --  7.4  --   --   --  7.6  NEUTROABS  --   --   --   --   --   --   --  4.7  HGB 7.3* 6.7*   < > 6.3*   < > 7.8* 6.6* 7.6*  HCT 22.8* 20.9*   < > 20.1*   < > 25.0* 21.4* 23.8*  MCV 88.4 87.1  --  88.5  --   --   --  89.1  PLT 318 233  --  329  --   --   --  333   < > = values in this interval not displayed.   Blood Culture    Component Value Date/Time   SDES BLOOD LEFT HAND 08/01/2015 0624   SPECREQUEST BOTTLES DRAWN AEROBIC AND ANAEROBIC  5CC 08/01/2015 0624   CULT NO GROWTH 5 DAYS 08/01/2015 0624   REPTSTATUS 08/06/2015 FINAL 08/01/2015 0624    Cardiac Enzymes: No  results for input(s): CKTOTAL, CKMB, CKMBINDEX, TROPONINI in the last 168 hours. CBG: No results for input(s): GLUCAP in the last 168 hours. Iron Studies: No results for input(s): IRON, TIBC, TRANSFERRIN, FERRITIN in the last 72 hours. @lablastinr3 @ Studies/Results: No results found. Medications:  methocarbamol (ROBAXIN) IV      (feeding supplement) PROSource Plus  30 mL Oral BID BM   sodium chloride   Intravenous Once   sodium chloride   Intravenous Once   sodium chloride   Intravenous Once   aspirin  81 mg Oral BID   atorvastatin  10 mg Oral QPM   camphor-menthol   Topical BID   Chlorhexidine Gluconate Cloth  6 each Topical Q0600   cinacalcet  120 mg Oral Q supper   darbepoetin (ARANESP) injection - DIALYSIS  200 mcg Intravenous Q Wed-HD   docusate sodium  100 mg Oral BID   midodrine  10 mg Oral Q M,W,F   oxyCODONE  10 mg Oral Q12H   pantoprazole  40 mg Oral BID   polyethylene  glycol  17 g Oral Daily   polyethylene glycol-electrolytes  4,000 mL Oral Once   sevelamer carbonate  1,600 mg Oral With snacks   sevelamer carbonate  4,000 mg Oral TID with meals   sucralfate  1 g Oral TID WC & HS   sulfamethoxazole-trimethoprim  1 tablet Oral QHS   topiramate  25 mg Oral BID     Dialysis Orders: MWF at [facility] 4[facility], 400/500, EDW 88kg, 2K/2.5Ca, AVF - heparin 2000 units IV TIW - Mircera 134mcg IV q 2 weeks (last 9/21 for Hgb 7.7)   Assessment/Plan:  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  ESRD:  Continue HD per MWF schedule - Next HD 11/25/2020 (coordinate around colonoscopy)  BP/volume: BP controlled, no edema on exam. Uses midodrine for HD.  Anemia (ESRD + GIB): Hgb down to 5.4, agreed for 1U PRBCs, given on 10/5. FOBT positive. S/p Aranesp 246mcg on 10/4. Tsat low - allergy to IV iron listed in chart. Hgb trending down again. GI consulted. 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 Patient is Jehovah Witness but has agreed to transfusion if  needed. Hgb stable today, s/p 1u prbc 10/10, another 1u prbc 10/12, c scope tomorrow  Metabolic bone disease: Ca/Phos good - continue home binders/sensipar. Nutrition: Alb low, push protein.  T2DM-per primary   Hx CVA  Gean Quint, MD [facility]

## 2020-11-24 NOTE — Progress Notes (Signed)
PROGRESS NOTE   Carl Daughdrill Sr.  IOX:[id number] DOB: [birth year] DOA: 11/14/2020 PCP: [name], MD  Brief Narrative:  60 year old home dwelling black male Dendron will occasionally accept blood Prior right reverse total shoulder arthroplasty 08/31/2020 ESRD MWF on HD, prior lacunar CVA, prior upper GI bleed-EGD superficial erythematous mucosa pyloric channel no stigmata of bleeding-was taken off of NSAIDs at that time  Electively admitted by Dr. Erlinda Hong 11/14/2020 for left total hip anterior arthroplasty hospitalization complicated by Anemia with hemoglobin 5.4 positive FOBT GI consulted and performed endoscopy  He has been transfused several times but still seems to have hemoglobin in the low normal range of 7 without source of bleed so decision made to perform colonoscopy 10/14  Hospital-Problem based course  Acute GI bleed Endoscopy 11/18/2020 = pyloric channel ulcer with visible vessel + hiatal hernia Continuing PPI twice daily X 1 month, Carafate 1 g 4 times daily and repeat endoscopy 8 weeks Now status post 3 to 4 units of blood Planning for colonoscopy to evaluate for other sources of bleeding on 10/14 as per GI Patient is allergic to IV iron despite TSAT being low so continue Aranesp every 2 weekly Left total hip arthroplasty Dr. Erlinda Hong 10/3 Stable at this time, keep incisional VAC for 2 weeks postop switch to portable unit on discharge, WBAT LLE Pain control Oxy IR 5-10 every 4 as needed, 10-15 for pain score 7-10 and continue OxyContin 10 mg every 12--pain seems moderately controlled Can continue Robaxin twice daily if needed in the outpatient setting ESRD MWF Management deferred to nephrology Continue midodrine as per orders, continue Renvela 4 g 3 times daily meals along with as needed, Sensipar 120 Prior lacunar infarct Transition back aspirin to once daily after 21 days continue Lipitor Hypotension in setting of chronic dialysis Continue midodrine 10 3 times  daily    DVT prophylaxis: Aspirin 81 twice daily, SCD Code Status: Full CODE STATUS Family Communication:  Disposition:  Status is: Inpatient  Remains inpatient appropriate because:Hemodynamically unstable and Unsafe d/c plan  Dispo:  Patient From:  Home  Planned Disposition:  Home with Health Care Svc  Medically stable for discharge:  Yes          Consultants:  Gastroenterology Dr. Lorenso Courier Orthopedics Dr. Erlinda Hong  Procedures:    Antimicrobials:     Subjective:  Comfortable-does not seem to have any distress No dark or tarry stool No chest pain no fever   Objective: Vitals:   11/23/20 1110 11/23/20 1200 11/23/20 2055 11/24/20 0751  BP: (!) 122/56 117/69 119/80 115/79  Pulse: 78 90 91 78  Resp: 11 18 17 17   Temp:  98.2 F (36.8 C) 98.6 F (37 C) 98.3 F (36.8 C)  TempSrc: Oral Oral Oral Oral  SpO2: 100% 100% 100% 100%  Weight: 89.2 kg     Height:        Intake/Output Summary (Last 24 hours) at 11/24/2020 1138 Last data filed at 11/24/2020 0900 Gross per 24 hour  Intake 340 ml  Output --  Net 340 ml    Filed Weights   11/21/20 1234 11/23/20 0714 11/23/20 1110  Weight: 92.2 kg 91.2 kg 89.2 kg    Examination:  Pleasant coherent no distress Chest clear no added sound rales rhonchi Abdomen soft no rebound no guarding No lower extremity edema Mild JVD noted S1-S2 no murmur no rub no gallop Neurologically intact moving all 4 limbs equally  Data Reviewed: personally reviewed   CBC    Component Value Date/Time  WBC 7.6 11/24/2020 0242   RBC 2.67 (L) 11/24/2020 0242   HGB 7.6 (L) 11/24/2020 0242   HCT 23.8 (L) 11/24/2020 0242   PLT 333 11/24/2020 0242   MCV 89.1 11/24/2020 0242   MCH 28.5 11/24/2020 0242   MCHC 31.9 11/24/2020 0242   RDW 15.5 11/24/2020 0242   LYMPHSABS 0.9 11/24/2020 0242   MONOABS 1.3 (H) 11/24/2020 0242   EOSABS 0.6 (H) 11/24/2020 0242   BASOSABS 0.1 11/24/2020 0242   CMP Latest Ref Rng & Units 11/24/2020  11/23/2020 11/21/2020  Glucose 70 - 99 mg/dL 95 123(H) 90  BUN 8 - 23 mg/dL 26(H) 24(H) 52(H)  Creatinine 0.61 - 1.24 mg/dL 5.60(H) 4.78(H) 10.00(H)  Sodium 135 - 145 mmol/L 133(L) 133(L) 131(L)  Potassium 3.5 - 5.1 mmol/L 4.1 3.2(L) 4.7  Chloride 98 - 111 mmol/L 94(L) 95(L) 94(L)  CO2 22 - 32 mmol/L 30 28 24   Calcium 8.9 - 10.3 mg/dL 8.3(L) 8.4(L) 8.2(L)  Total Protein 6.5 - 8.1 g/dL - - -  Total Bilirubin 0.3 - 1.2 mg/dL - - -  Alkaline Phos 38 - 126 U/L - - -  AST 15 - 41 U/L - - -  ALT 0 - 44 U/L - - -     Radiology Studies: No results found.   Scheduled Meds:  (feeding supplement) PROSource Plus  30 mL Oral BID BM   sodium chloride   Intravenous Once   sodium chloride   Intravenous Once   sodium chloride   Intravenous Once   aspirin  81 mg Oral BID   atorvastatin  10 mg Oral QPM   camphor-menthol   Topical BID   Chlorhexidine Gluconate Cloth  6 each Topical Q0600   cinacalcet  120 mg Oral Q supper   darbepoetin (ARANESP) injection - DIALYSIS  200 mcg Intravenous Q Wed-HD   docusate sodium  100 mg Oral BID   midodrine  10 mg Oral Q M,W,F   oxyCODONE  10 mg Oral Q12H   pantoprazole  40 mg Oral BID   polyethylene glycol  17 g Oral Daily   polyethylene glycol-electrolytes  4,000 mL Oral Once   sevelamer carbonate  1,600 mg Oral With snacks   sevelamer carbonate  4,000 mg Oral TID with meals   sucralfate  1 g Oral TID WC & HS   sulfamethoxazole-trimethoprim  1 tablet Oral QHS   topiramate  25 mg Oral BID   Continuous Infusions:  methocarbamol (ROBAXIN) IV       LOS: 8 days   Time spent: 12  Nita Sells, MD [facility] To contact the attending provider between 7A-7P or the covering provider during after hours 7P-7A, please log into the web site [email or web address] and access using universal [place] password for that web site. If you do not have the password, please call the hospital operator.  11/24/2020, 11:38 AM

## 2020-11-24 NOTE — Plan of Care (Signed)
  Problem: Activity: Goal: Risk for activity intolerance will decrease Outcome: Progressing   Problem: Pain Managment: Goal: General experience of comfort will improve Outcome: Progressing   Problem: Safety: Goal: Ability to remain free from injury will improve Outcome: Progressing   

## 2020-11-24 NOTE — Progress Notes (Signed)
Patient ID: Carl Coles Sr., male   DOB: Dec 12, 1960, 61 y.o.   MRN: [id number]  Patient continues to do well in regards to the left hip.  Minimal pain and mobilizing with PT.  Incisional vac still in place and functioning without fluid in the canister.  Hemoglobin trended up from this am, but still unstable.  Plan for colonoscopy Friday.  Patient to continue mobilizing with PT until d/c.  Will plan to remove the vac in addition to the sutures this coming Monday if still inpatient, otherwise, will need to f/u in or office for this.

## 2020-11-25 ENCOUNTER — Encounter (HOSPITAL_COMMUNITY)
Admission: AD | Disposition: A | Discharge status: Home / Self Care | Payer: Self-pay | Source: Ambulatory Visit | Attending: Internal Medicine

## 2020-11-25 ENCOUNTER — Encounter (HOSPITAL_COMMUNITY): Payer: Self-pay | Admitting: Internal Medicine

## 2020-11-25 ENCOUNTER — Inpatient Hospital Stay (HOSPITAL_COMMUNITY): Payer: Medicare Other | Admitting: Anesthesiology

## 2020-11-25 DIAGNOSIS — D649 Anemia, unspecified: Secondary | ICD-10-CM

## 2020-11-25 DIAGNOSIS — K5730 Diverticulosis of large intestine without perforation or abscess without bleeding: Secondary | ICD-10-CM

## 2020-11-25 DIAGNOSIS — K648 Other hemorrhoids: Secondary | ICD-10-CM

## 2020-11-25 HISTORY — PX: COLONOSCOPY WITH PROPOFOL: SHX5780

## 2020-11-25 LAB — CBC WITH DIFFERENTIAL/PLATELET
Abs Immature Granulocytes: 0.02 10*3/uL (ref 0.00–0.07)
Basophils Absolute: 0.1 10*3/uL (ref 0.0–0.1)
Basophils Relative: 2 %
Eosinophils Absolute: 0.5 10*3/uL (ref 0.0–0.5)
Eosinophils Relative: 8 %
HCT: 28.4 % — ABNORMAL LOW (ref 39.0–52.0)
Hemoglobin: 8.8 g/dL — ABNORMAL LOW (ref 13.0–17.0)
Immature Granulocytes: 0 %
Lymphocytes Relative: 10 %
Lymphs Abs: 0.6 10*3/uL — ABNORMAL LOW (ref 0.7–4.0)
MCH: 27.9 pg (ref 26.0–34.0)
MCHC: 31 g/dL (ref 30.0–36.0)
MCV: 90.2 fL (ref 80.0–100.0)
Monocytes Absolute: 0.9 10*3/uL (ref 0.1–1.0)
Monocytes Relative: 15 %
Neutro Abs: 3.8 10*3/uL (ref 1.7–7.7)
Neutrophils Relative %: 65 %
Platelets: 376 10*3/uL (ref 150–400)
RBC: 3.15 MIL/uL — ABNORMAL LOW (ref 4.22–5.81)
RDW: 15.4 % (ref 11.5–15.5)
WBC: 5.8 10*3/uL (ref 4.0–10.5)
nRBC: 0 % (ref 0.0–0.2)

## 2020-11-25 LAB — SURGICAL PATHOLOGY

## 2020-11-25 SURGERY — COLONOSCOPY WITH PROPOFOL
Anesthesia: Monitor Anesthesia Care

## 2020-11-25 MED ORDER — CEPHALEXIN 500 MG PO CAPS
500.0000 mg | ORAL_CAPSULE | Freq: Every evening | ORAL | Status: DC
  Administered 2020-11-25 (×2): 500 mg via ORAL
  Filled 2020-11-25 (×2): qty 1

## 2020-11-25 MED ORDER — PROPOFOL 10 MG/ML IV BOLUS
INTRAVENOUS | Status: DC | PRN
  Administered 2020-11-25: 100 ug/kg/min via INTRAVENOUS

## 2020-11-25 MED ORDER — LACTATED RINGERS IV SOLN: INTRAVENOUS | Status: DC | PRN

## 2020-11-25 MED ORDER — SODIUM CHLORIDE 0.9 % IV SOLN: INTRAVENOUS | Status: DC

## 2020-11-25 SURGICAL SUPPLY — 22 items

## 2020-11-25 NOTE — Op Note (Signed)
[facility] Patient Name: [name] Procedure Date : 11/25/2020 MRN: [id number] Attending MD: [name] ,  Date of Birth: 03-27-1960 CSN: [id number] Age: 60 Admit Type: Inpatient Procedure:                Colonoscopy Indications:              Anemia Providers:                [name]" Pollyann Glen Person, Janee Morn, Technician Referring MD:             Hospitalist team Medicines:                Monitored Anesthesia Care Complications:            No immediate complications. Estimated Blood Loss:     Estimated blood loss: none. Procedure:                Pre-Anesthesia Assessment:                           - Prior to the procedure, a History and Physical                            was performed, and patient medications and                            allergies were reviewed. The patient's tolerance of                            previous anesthesia was also reviewed. The risks                            and benefits of the procedure and the sedation                            options and risks were discussed with the patient.                            All questions were answered, and informed consent                            was obtained. Prior Anticoagulants: The patient has                            taken no previous anticoagulant or antiplatelet                            agents. ASA Grade Assessment: III - A patient with                            severe systemic disease. After reviewing the risks                            and benefits,  the patient was deemed in                            satisfactory condition to undergo the procedure.                           After obtaining informed consent, the colonoscope                            was passed under direct vision. Throughout the                            procedure, the patient's blood pressure, pulse, and                            oxygen saturations  were monitored continuously. The                            CF-HQ190L ([id number]) Olympus coloscope was                            introduced through the anus and advanced to the the                            terminal ileum. The colonoscopy was performed                            without difficulty. The patient tolerated the                            procedure well. The quality of the bowel                            preparation was inadequate. Scope In: 9:40:26 AM Scope Out: 10:05:48 AM Scope Withdrawal Time: 0 hours 11 minutes 31 seconds  Total Procedure Duration: 0 hours 25 minutes 22 seconds  Findings:      The terminal ileum appeared normal.      There was evidence of a prior side-to-side ileo-colonic anastomosis in       the transverse colon. This was patent and was characterized by healthy       appearing mucosa. The anastomosis was traversed.      Multiple small and large-mouthed diverticula were found in the sigmoid       colon, descending colon and transverse colon.      Non-bleeding internal hemorrhoids were found during retroflexion. Impression:               - Preparation of the colon was inadequate.                           - The examined portion of the ileum was normal.                           - Patent side-to-side ileo-colonic anastomosis,  characterized by healthy appearing mucosa.                           - Diverticulosis in the sigmoid colon, in the                            descending colon and in the transverse colon.                           - Non-bleeding internal hemorrhoids.                           - No specimens collected. Recommendation:           - Return patient to hospital ward for ongoing care.                           - Patient's hemoglobin has stabilized. No sources                            of bleeding were identified on today's exam. Do not                            feel that he needs additional endoscopic  evaluation                            at this time.                           - Trend hemoglobin.                           - This colonoscopy was not adequate for colon                            cancer screening. If the patient is interested in                            colon cancr screening in the future, recommend that                            he remain on clear liquid diet for two days and get                            a Golytely preparation before his procedure to                            allow for adequate clean out. He can discuss this                            further with his primary care physician.                           - The findings and recommendations were discussed  with the patient. Procedure Code(s):        --- Professional ---                           [phone], Colonoscopy, flexible; diagnostic, including                            collection of specimen(s) by brushing or washing,                            when performed (separate procedure) Diagnosis Code(s):        --- Professional ---                           K64.8, Other hemorrhoids                           Z98.0, Intestinal bypass and anastomosis status                           D64.9, Anemia, unspecified                           K57.30, Diverticulosis of large intestine without                            perforation or abscess without bleeding CPT copyright 2019 American Medical Association. All rights reserved. The codes documented in this report are preliminary and upon coder review may  be revised to meet current compliance requirements. [name] "[name],  11/25/2020 10:23:45 AM Number of Addenda: 0

## 2020-11-25 NOTE — Anesthesia Preprocedure Evaluation (Signed)
Anesthesia Evaluation  Patient identified by MRN, date of birth, ID band Patient awake    Reviewed: Allergy & Precautions, NPO status , Patient's Chart, lab work & pertinent test results  History of Anesthesia Complications Negative for: history of anesthetic complications  Airway Mallampati: II  TM Distance: >3 FB Neck ROM: Full    Dental no notable dental hx.    Pulmonary sleep apnea , former smoker,    Pulmonary exam normal breath sounds clear to auscultation       Cardiovascular hypertension, + Peripheral Vascular Disease  Normal cardiovascular exam Rhythm:Regular Rate:Normal  Nuclear stress 10/2020: low risk, EF 58%   Neuro/Psych  Headaches, Anxiety Depression TIACVA, No Residual Symptoms    GI/Hepatic Neg liver ROS, PUD, GERD  Controlled and Medicated,UGIB   Endo/Other  diabetes, Type 2  Renal/GU Dialysis and ESRFRenal disease (HD M/W/F)  negative genitourinary   Musculoskeletal  (+) Arthritis ,   Abdominal   Peds  Hematology  (+) anemia , JEHOVAH'S WITNESSHgb 6.7   Anesthesia Other Findings Day of surgery medications reviewed with patient.  Reproductive/Obstetrics negative OB ROS                             Anesthesia Physical  Anesthesia Plan  ASA: 4  Anesthesia Plan: MAC   Post-op Pain Management:    Induction: Intravenous  PONV Risk Score and Plan: 1 and Treatment may vary due to age or medical condition  Airway Management Planned: Simple Face Mask  Additional Equipment: None  Intra-op Plan:   Post-operative Plan:   Informed Consent: I have reviewed the patients History and Physical, chart, labs and discussed the procedure including the risks, benefits and alternatives for the proposed anesthesia with the patient or authorized representative who has indicated his/her understanding and acceptance.       Plan Discussed with:   Anesthesia Plan Comments: (Does  not wish to receive blood products unless absolutely necessary to prevent permanent disability or death, but consents to receiving blood products in that scenario.)        Anesthesia Quick Evaluation

## 2020-11-25 NOTE — Plan of Care (Signed)

## 2020-11-25 NOTE — Anesthesia Postprocedure Evaluation (Signed)
Anesthesia Post Note  Patient: Carl Tones Sr.  Procedure(s) Performed: COLONOSCOPY WITH PROPOFOL     Anesthesia Post Evaluation No notable events documented.  Last Vitals:  Vitals:   11/25/20 1012 11/25/20 1030  BP: 130/64   Pulse: 74   Resp:    Temp: 36.6 C (!) 36.2 C  SpO2: 94% 92%    Last Pain:  Vitals:   11/25/20 1030  TempSrc:   PainSc: 0-No pain                 Lynda Rainwater

## 2020-11-25 NOTE — Progress Notes (Signed)
[facility] Progress Note    Dialysis Orders: MWF at [facility] 4hr, 400/500, EDW 88kg, 2K/2.5Ca, AVF - heparin 2000 units IV TIW - Mircera 157mcg IV q 2 weeks (last 9/21 for Hgb 7.7)   Assessment/Plan:  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  ESRD:  Continue HD per MWF schedule; net UF 1080mL 10/12 - Next HD 11/25/2020 (coordinate around colonoscopy)  BP/volume: BP controlled, no edema on exam. Uses midodrine for HD.  Anemia (ESRD + GIB): Hgb down to 5.4, agreed for 1U PRBCs, given on 10/5. FOBT positive. S/p Aranesp 266mcg on 10/4. Tsat low - allergy to IV iron listed in chart. Hgb trending down again. GI consulted. 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 Patient is Jehovah Witness but has agreed to transfusion if needed. Hgb stable today, s/p 1u prbc 10/10, another 1u prbc 10/12, c scope today  Metabolic bone disease: Ca/Phos good - continue home binders/sensipar; phos 3.3 on 10/13. Nutrition: Alb low, push protein.  T2DM-per primary   Hx CVA  Subjective: Fatigued but no fever, chills, dyspnea.  Colonoscopy today.  Objective Vitals:   11/24/20 0751 11/24/20 1318 11/24/20 1551 11/25/20 0803  BP: 115/79 111/75 96/77 108/76  Pulse: 78 80 89 82  Resp: 17 17 16 18   Temp: 98.3 F (36.8 C) 98.4 F (36.9 C) 97.8 F (36.6 C) 97.8 F (36.6 C)  TempSrc: Oral Oral  Oral  SpO2: 100%   98%  Weight:      Height:       Physical Exam General: NAD Heart: S1,S2 RRR No M/R/G Lungs: CTAB A/P Abdomen: Soft, NABS Extremities: No edema. Neuro: awake, alert Dialysis Access: R AVF aneurysmal +T/B   Additional Objective Labs: Basic Metabolic Panel: Recent Labs  Lab 11/21/20 0153 11/23/20 0903 11/24/20 0242  NA 131* 133* 133*  K 4.7 3.2* 4.1  CL 94* 95* 94*  CO2 24 28 30   GLUCOSE 90 123* 95  BUN 52* 24* 26*  CREATININE 10.00* 4.78* 5.60*  CALCIUM 8.2* 8.4* 8.3*  PHOS 4.1 2.2* 3.3   Liver Function Tests: Recent Labs  Lab  11/21/20 0153 11/23/20 0903 11/24/20 0242  ALBUMIN 1.9* 2.1* 1.9*   No results for input(s): LIPASE, AMYLASE in the last 168 hours. CBC: Recent Labs  Lab 11/21/20 0153 11/21/20 1943 11/23/20 0210 11/24/20 0242 11/25/20 0259  WBC 7.4  --   --  7.6 5.8  NEUTROABS  --   --   --  4.7 3.8  HGB 6.3*   < > 6.6* 7.6* 8.8*  HCT 20.1*   < > 21.4* 23.8* 28.4*  MCV 88.5  --   --  89.1 90.2  PLT 329  --   --  333 376   < > = values in this interval not displayed.   Blood Culture    Component Value Date/Time   SDES BLOOD LEFT HAND 08/01/2015 0624   SPECREQUEST BOTTLES DRAWN AEROBIC AND ANAEROBIC  5CC 08/01/2015 0624   CULT NO GROWTH 5 DAYS 08/01/2015 0624   REPTSTATUS 08/06/2015 FINAL 08/01/2015 0624    Cardiac Enzymes: No results for input(s): CKTOTAL, CKMB, CKMBINDEX, TROPONINI in the last 168 hours. CBG: No results for input(s): GLUCAP in the last 168 hours. Iron Studies: No results for input(s): IRON, TIBC, TRANSFERRIN, FERRITIN in the last 72 hours. @lablastinr3 @ Studies/Results: No results found. Medications:  methocarbamol (ROBAXIN) IV      (feeding supplement) PROSource Plus  30 mL Oral BID BM  sodium chloride   Intravenous Once   sodium chloride   Intravenous Once   sodium chloride   Intravenous Once   aspirin  81 mg Oral BID   atorvastatin  10 mg Oral QPM   camphor-menthol   Topical BID   Chlorhexidine Gluconate Cloth  6 each Topical Q0600   cinacalcet  120 mg Oral Q supper   darbepoetin (ARANESP) injection - DIALYSIS  200 mcg Intravenous Q Wed-HD   docusate sodium  100 mg Oral BID   midodrine  10 mg Oral Q M,W,F   oxyCODONE  10 mg Oral Q12H   pantoprazole  40 mg Oral BID   polyethylene glycol  17 g Oral Daily   sevelamer carbonate  1,600 mg Oral With snacks   sevelamer carbonate  4,000 mg Oral TID with meals   sucralfate  1 g Oral TID WC & HS   sulfamethoxazole-trimethoprim  1 tablet Oral QHS   topiramate  25 mg Oral BID

## 2020-11-25 NOTE — Progress Notes (Signed)
Discharge instructions, RX's and follow up appts explained and provided to patient verbalized understanding. Patient switched to home wound vac, charger packed with patient supplies.   Jonathen Rathman, Tivis Ringer, RN

## 2020-11-25 NOTE — Plan of Care (Signed)
Problem: Education: Goal: Knowledge of General Education information will improve Description: Including pain rating scale, medication(s)/side effects and non-pharmacologic comfort measures 11/25/2020 1826 by Emmaline Life, RN Outcome: Adequate for Discharge 11/25/2020 1825 by Emmaline Life, RN Outcome: Adequate for Discharge   Problem: Health Behavior/Discharge Planning: Goal: Ability to manage health-related needs will improve 11/25/2020 1826 by Emmaline Life, RN Outcome: Adequate for Discharge 11/25/2020 1825 by Emmaline Life, RN Outcome: Adequate for Discharge   Problem: Clinical Measurements: Goal: Ability to maintain clinical measurements within normal limits will improve 11/25/2020 1826 by Emmaline Life, RN Outcome: Adequate for Discharge 11/25/2020 1825 by Emmaline Life, RN Outcome: Adequate for Discharge Goal: Will remain free from infection 11/25/2020 1826 by Emmaline Life, RN Outcome: Adequate for Discharge 11/25/2020 1825 by Emmaline Life, RN Outcome: Adequate for Discharge Goal: Diagnostic test results will improve 11/25/2020 1826 by Emmaline Life, RN Outcome: Adequate for Discharge 11/25/2020 1825 by Emmaline Life, RN Outcome: Adequate for Discharge Goal: Respiratory complications will improve 11/25/2020 1826 by Emmaline Life, RN Outcome: Adequate for Discharge 11/25/2020 1825 by Emmaline Life, RN Outcome: Adequate for Discharge Goal: Cardiovascular complication will be avoided 11/25/2020 1826 by Emmaline Life, RN Outcome: Adequate for Discharge 11/25/2020 1825 by Emmaline Life, RN Outcome: Adequate for Discharge   Problem: Activity: Goal: Risk for activity intolerance will decrease 11/25/2020 1826 by Emmaline Life, RN Outcome: Adequate for Discharge 11/25/2020 1825 by Emmaline Life, RN Outcome: Adequate for Discharge   Problem: Nutrition: Goal: Adequate nutrition will be  maintained 11/25/2020 1826 by Emmaline Life, RN Outcome: Adequate for Discharge 11/25/2020 1825 by Emmaline Life, RN Outcome: Adequate for Discharge   Problem: Coping: Goal: Level of anxiety will decrease 11/25/2020 1826 by Emmaline Life, RN Outcome: Adequate for Discharge 11/25/2020 1825 by Emmaline Life, RN Outcome: Adequate for Discharge   Problem: Elimination: Goal: Will not experience complications related to bowel motility 11/25/2020 1826 by Emmaline Life, RN Outcome: Adequate for Discharge 11/25/2020 1825 by Emmaline Life, RN Outcome: Adequate for Discharge Goal: Will not experience complications related to urinary retention 11/25/2020 1826 by Emmaline Life, RN Outcome: Adequate for Discharge 11/25/2020 1825 by Emmaline Life, RN Outcome: Adequate for Discharge   Problem: Pain Managment: Goal: General experience of comfort will improve 11/25/2020 1826 by Emmaline Life, RN Outcome: Adequate for Discharge 11/25/2020 1825 by Emmaline Life, RN Outcome: Adequate for Discharge   Problem: Safety: Goal: Ability to remain free from injury will improve 11/25/2020 1826 by Emmaline Life, RN Outcome: Adequate for Discharge 11/25/2020 1825 by Emmaline Life, RN Outcome: Adequate for Discharge   Problem: Skin Integrity: Goal: Risk for impaired skin integrity will decrease 11/25/2020 1826 by Emmaline Life, RN Outcome: Adequate for Discharge 11/25/2020 1825 by Emmaline Life, RN Outcome: Adequate for Discharge   Problem: Acute Rehab PT Goals(only PT should resolve) Goal: Pt Will Go Supine/Side To Sit Outcome: Adequate for Discharge Goal: Pt Will Go Sit To Supine/Side Outcome: Adequate for Discharge Goal: Patient Will Transfer Sit To/From Stand Outcome: Adequate for Discharge Goal: Pt Will Ambulate Outcome: Adequate for Discharge Goal: Pt Will Go Up/Down Stairs Outcome: Adequate for Discharge Goal: Pt/caregiver will  Perform Home Exercise Program Outcome: Adequate for Discharge   Problem: Acute Rehab OT Goals (only OT should resolve) Goal: Pt. Will Perform Lower Body Dressing Outcome: Adequate for Discharge Goal: Pt. Will Transfer To Toilet Outcome: Adequate for  Discharge Goal: Pt. Will Perform Tub/Shower Transfer Outcome: Adequate for Discharge

## 2020-11-25 NOTE — Plan of Care (Signed)

## 2020-11-25 NOTE — Discharge Summary (Signed)
Physician Discharge Summary  Lyn Deemer Sr. POE:[id number] DOB: March 07, 1960 DOA: 11/14/2020  PCP: [name], MD  Admit date: 11/14/2020 Discharge date: 11/25/2020  Time spent: 33 minutes  Recommendations for Outpatient Follow-up:  Needs OP cbc and renal panel with renal Protonix bid--caution with Carafate as is renal patient Continue ASA 81 bid for 3 weeks ending 10/24 then go back to 81 mg daily dosing  Discharge Diagnoses:  MAIN problem for hospitalization   L hip arthoplasty Upper GI bleed  Please see below for itemized issues addressed in [place]- refer to other progress notes for clarity if needed  Discharge Condition: improved  Diet recommendation: renal  Filed Weights   11/21/20 1234 11/23/20 0714 11/23/20 1110  Weight: 92.2 kg 91.2 kg 89.2 kg    History of present illness:  60 year old home dwelling black male [employer] will occasionally accept blood Prior right reverse total shoulder arthroplasty 08/31/2020 ESRD MWF on HD, prior lacunar CVA, prior upper GI bleed-EGD superficial erythematous mucosa pyloric channel no stigmata of bleeding-was taken off of NSAIDs at that time   Electively admitted by Dr. Erlinda Hong 11/14/2020 for left total hip anterior arthroplasty hospitalization complicated by Anemia with hemoglobin 5.4 positive FOBT GI consulted and performed endoscopy   He has been transfused several times but still seems to have hemoglobin in the low normal range of 7 without source of bleed so decision made to perform colonoscopy 10/14  Hospital Course:  Acute GI bleed Endoscopy 11/18/2020 = pyloric channel ulcer with visible vessel + hiatal hernia Continuing PPI twice daily X 1 month, Carafate held as is renal patient Needs repeat endoscopy 8 weeks rec'd 3 to 4 units of blood colonoscopy 10/14 poor prep,,visualized mucosa intact without bleed--feel has stabilized form acute UGIB--OP consideration for Colonoscopy if felt needed with better  prep allergic to IV iron despite TSAT being low --Aranesp every 2 weekly Left total hip arthroplasty Dr. Erlinda Hong 10/3 Stable at this time, keep incisional VAC for 2 weeks postop switch to portable unit on discharge, WBAT LLE Pain control Oxy IR 5-10 every 4 as needed, 10-15 for pain score 7-10 and continue OxyContin 10 mg every 12--pain seems moderately controlled Can continue Robaxin twice daily if needed in the outpatient setting ESRD MWF Management deferred to nephrology Continue midodrine as per orders, continue Renvela 4 g 3 times daily meals along with as needed, Sensipar 120 Prior lacunar infarct Transition back aspirin to once daily after 21 days continue Lipitor Hypotension in setting of chronic dialysis Continue midodrine 10 3 times daily    Procedures: Surgery 10/3 Upper endoscopy 10/7 Colocoscopy 10/14  Consultations: GI Orthopedics  Discharge Exam: Vitals:   11/25/20 1030 11/25/20 1031  BP:    Pulse:  68  Resp:  13  Temp: (!) 97.2 F (36.2 C)   SpO2: 92% 93%    Subj on day of d/c   Awake alert no distress Back from colonoscopy No fever chills n/v effluent from prep was non bloody He feels fine and strong   General Exam on discharge  Eomi ncat no focal deficit Well appearing No ict no pallor Cta b no added sound no rales No LE edema Neuro intact, power 5/5  Discharge Instructions   Discharge Instructions     Diet - low sodium heart healthy   Complete by: As directed    Discharge instructions   Complete by: As directed    Follow in OP setting with Orthopedics Ensure that u get ur meds reflled at  that  visit for pain control We will order therapy to come out and evaluate u Make sure that you report severe pain, fever or bleeding to your primary MD   Increase activity slowly   Complete by: As directed    Leave dressing on - Keep it clean, dry, and intact until clinic visit   Complete by: As directed       Allergies as of 11/25/2020        Reactions   Infed [iron Dextran] Other (See Comments)   Decreased BP    Oxycodone Nausea Only   Vicodin [hydrocodone-acetaminophen] Other (See Comments)   Decreased BP   Hydrocodone-acetaminophen    Other reaction(s): lower BP   Diclofenac Other (See Comments)   Unknown        Medication List     STOP taking these medications    aspirin EC 81 MG tablet Replaced by: aspirin 81 MG chewable tablet   cephALEXin 500 MG capsule Commonly known as: Keflex   gabapentin 100 MG capsule Commonly known as: NEURONTIN   ibuprofen 800 MG tablet Commonly known as: ADVIL   omeprazole 20 MG capsule Commonly known as: PRILOSEC   QC TUMERIC COMPLEX PO       TAKE these medications    (feeding supplement) PROSource Plus liquid Take 30 mLs by mouth 2 (two) times daily between meals for 7 days.   aspirin 81 MG chewable tablet Chew 1 tablet (81 mg total) by mouth 2 (two) times daily for 24 days. Take 1 tablet twice daily until 11/4 then on 11/5, decrease to 1 tablet one time daily Replaces: aspirin EC 81 MG tablet   atorvastatin 10 MG tablet Commonly known as: LIPITOR Take 10 mg by mouth every evening.   camphor-menthol lotion Commonly known as: SARNA Apply topically 2 (two) times daily.   cinacalcet 60 MG tablet Commonly known as: SENSIPAR Take 120 mg by mouth daily with supper.   cromolyn 4 % ophthalmic solution Commonly known as: OPTICROM Place 1 drop into both eyes daily as needed (redness).   DIALYVITE PO Take 1 tablet by mouth daily.   docusate sodium 100 MG capsule Commonly known as: Colace Take 1 capsule (100 mg total) by mouth daily as needed. What changed: when to take this   methocarbamol 500 MG tablet Commonly known as: Robaxin Take 1 tablet (500 mg total) by mouth 2 (two) times daily as needed. To be taken after surgery   midodrine 10 MG tablet Commonly known as: PROAMATINE Take 10 mg by mouth See admin instructions. Take 10 mg by mouth three times a  week as directed, bring to dialysis. May need an additional tablet during treatment.   ondansetron 4 MG tablet Commonly known as: Zofran Take 1 tablet (4 mg total) by mouth every 8 (eight) hours as needed for nausea or vomiting.   oxyCODONE-acetaminophen 5-325 MG tablet Commonly known as: Percocet Take 1-2 tablets by mouth every 6 (six) hours as needed. To be taken after surgery   pantoprazole 40 MG tablet Commonly known as: PROTONIX Take 1 tablet (40 mg total) by mouth 2 (two) times daily.   sevelamer carbonate 800 MG tablet Commonly known as: RENVELA Take 1,600-4,000 mg by mouth See admin instructions. Take 4000 mg by mouth three times daily with meals and 1600-2400 mg with snacks   sucralfate 1 GM/10ML suspension Commonly known as: CARAFATE Take 10 mLs (1 g total) by mouth 4 (four) times daily for 14 days.   topiramate 25 MG tablet Commonly  known as: TOPAMAX Take 1 tablet (25 mg total) by mouth 2 (two) times daily.   vitamin E 180 MG (400 UNITS) capsule Take 400 Units by mouth daily.               Durable Medical Equipment  (From admission, onward)           Start     Ordered   11/14/20 2028  DME Walker rolling  Once       Question:  Patient needs a walker to treat with the following condition  Answer:  History of hip replacement   11/14/20 2027   11/14/20 2028  DME 3 n 1  Once        11/14/20 2027   11/14/20 2028  DME Bedside commode  Once       Question:  Patient needs a bedside commode to treat with the following condition  Answer:  History of hip replacement   11/14/20 2027              Discharge Care Instructions  (From admission, onward)           Start     Ordered   11/25/20 0000  Leave dressing on - Keep it clean, dry, and intact until clinic visit        11/25/20 1221           Allergies  Allergen Reactions   Infed [Iron Dextran] Other (See Comments)    Decreased BP    Oxycodone Nausea Only   Vicodin  [Hydrocodone-Acetaminophen] Other (See Comments)    Decreased BP   Hydrocodone-Acetaminophen     Other reaction(s): lower BP   Diclofenac Other (See Comments)    Unknown    Follow-up Information     Leandrew Koyanagi, MD. Schedule an appointment as soon as possible for a visit on 11/29/2020.   Specialty: Orthopedic Surgery Contact information: [place] [address] [phone]         [name], MD. Call today.   Specialty: Family Medicine Why: Please call for a posthospital follow-up appointment. Contact information: [place] [address] [phone]         Buford Dresser, MD .   Specialty: Cardiology Contact information: [address] [place] [address] [place], [place] Follow up.   Specialty: Home Health Services Why: [facility] health agency Contact information: [address] [place] [address] [phone]         [facility] Follow up.   Why: your medical equipment company Contact information: [address] [place] [address] [phone]                  The results of significant diagnostics from this hospitalization (including imaging, microbiology, ancillary and laboratory) are listed below for reference.    Significant Diagnostic Studies: DG Pelvis Portable  Result Date: 11/14/2020 CLINICAL DATA:  Status post left arthroplasty EXAM: PORTABLE PELVIS 1 VIEWS COMPARISON:  Films from earlier in the same day. FINDINGS: Pelvic ring is intact. Left hip replacement is now seen in satisfactory position. No acute bony or soft tissue abnormality is noted. Vascular calcifications are seen. IMPRESSION: Status post left hip replacement Electronically Signed   By: Inez Catalina M.D.   On: 11/14/2020 19:04   DG Ankle Left Port  Result Date: 11/14/2020 CLINICAL DATA:  Ankle pain EXAM:  PORTABLE LEFT  ANKLE - 2 VIEW COMPARISON:  None. FINDINGS: Soft tissue swelling is present. Age indeterminate avulsions at the tips of the medial and lateral malleoli. Ankle mortise is symmetric. Vascular calcifications. IMPRESSION: Soft tissue swelling. Age indeterminate avulsions off the tips of the medial and lateral malleoli Electronically Signed   By: Donavan Foil M.D.   On: 11/14/2020 19:42   DG C-Arm 1-60 Min-No Report  Result Date: 11/14/2020 Fluoroscopy was utilized by the requesting physician.  No radiographic interpretation.   DG C-Arm 1-60 Min-No Report  Result Date: 11/14/2020 Fluoroscopy was utilized by the requesting physician.  No radiographic interpretation.   DG HIP OPERATIVE UNILAT WITH PELVIS LEFT  Result Date: 11/14/2020 CLINICAL DATA:  Left hip replacement EXAM: OPERATIVE LEFT HIP WITH PELVIS COMPARISON:  05/19/2020 FLUOROSCOPY TIME:  Radiation Exposure Index (as provided by the fluoroscopic device): 1.98 mGy If the device does not provide the exposure index: Fluoroscopy Time:  18 seconds Number of Acquired Images:  4 FINDINGS: Initial images demonstrate significant destruction of the left femoral head progressed in the interval from the prior exam. Subsequent images demonstrate removal of the femoral neck and femoral head with placement of left hip prosthesis. No acute bony or soft tissue abnormality is seen. Lucent lesion is again noted in the region of the right femoral neck and head. IMPRESSION: Left hip arthroplasty Electronically Signed   By: Inez Catalina M.D.   On: 11/14/2020 19:05    Microbiology: No results found for this or any previous visit (from the past 240 hour(s)).   Labs: Basic Metabolic Panel: Recent Labs  Lab 11/21/20 0153 11/23/20 0903 11/24/20 0242  NA 131* 133* 133*  K 4.7 3.2* 4.1  CL 94* 95* 94*  CO2 24 28 30   GLUCOSE 90 123* 95  BUN 52* 24* 26*  CREATININE 10.00* 4.78* 5.60*  CALCIUM 8.2* 8.4* 8.3*  PHOS 4.1 2.2* 3.3   Liver Function Tests: Recent  Labs  Lab 11/21/20 0153 11/23/20 0903 11/24/20 0242  ALBUMIN 1.9* 2.1* 1.9*   No results for input(s): LIPASE, AMYLASE in the last 168 hours. No results for input(s): AMMONIA in the last 168 hours. CBC: Recent Labs  Lab 11/21/20 0153 11/21/20 1943 11/22/20 0733 11/23/20 0210 11/24/20 0242 11/25/20 0259  WBC 7.4  --   --   --  7.6 5.8  NEUTROABS  --   --   --   --  4.7 3.8  HGB 6.3* 8.4* 7.8* 6.6* 7.6* 8.8*  HCT 20.1* 26.6* 25.0* 21.4* 23.8* 28.4*  MCV 88.5  --   --   --  89.1 90.2  PLT 329  --   --   --  333 376   Cardiac Enzymes: No results for input(s): CKTOTAL, CKMB, CKMBINDEX, TROPONINI in the last 168 hours. BNP: BNP (last 3 results) No results for input(s): BNP in the last 8760 hours.  ProBNP (last 3 results) No results for input(s): PROBNP in the last 8760 hours.  CBG: No results for input(s): GLUCAP in the last 168 hours.     Signed:  Nita Sells MD   [facility] 11/25/2020, 12:21 PM

## 2020-11-25 NOTE — Transfer of Care (Signed)
Immediate Anesthesia Transfer of Care Note  Patient: Carl Breeden Sr.  Procedure(s) Performed: COLONOSCOPY WITH PROPOFOL  Patient Location: PACU  Anesthesia Type:MAC and General  Level of Consciousness: drowsy  Airway & Oxygen Therapy: Patient Spontanous Breathing and Patient connected to nasal cannula oxygen  Post-op Assessment: Report given to RN and Post -op Vital signs reviewed and stable  Post vital signs: stable  Last Vitals:  Vitals Value Taken Time  BP 130/64 11/25/20 1012  Temp    Pulse 74 11/25/20 1013  Resp 12 11/25/20 1013  SpO2 97 % 11/25/20 1013  Vitals shown include unvalidated device data.  Last Pain:  Vitals:   11/25/20 0830  TempSrc: Temporal  PainSc: 5       Patients Stated Pain Goal: 1 (56/31/49 7026)  Complications: No notable events documented.

## 2020-11-25 NOTE — Interval H&P Note (Signed)
History and Physical Interval Note:  11/25/2020 9:13 AM  Carl Found Sr.  has presented today for surgery, with the diagnosis of Anemia.  The various methods of treatment have been discussed with the patient and family. After consideration of risks, benefits and other options for treatment, the patient has consented to  Procedure(s): COLONOSCOPY WITH PROPOFOL (N/A) as a surgical intervention.  The patient's history has been reviewed, patient examined, no change in status, stable for surgery.  I have reviewed the patient's chart and labs.  Questions were answered to the patient's satisfaction.     Sharyn Creamer

## 2020-11-25 NOTE — Progress Notes (Signed)
PT Cancellation Note  Patient Details Name: Carl Rupe Sr. MRN: [id number] DOB: 05-05-1960   Cancelled Treatment:    Reason Eval/Treat Not Completed: Patient at procedure or test/unavailable. In surgery, will retry at another time.   Ramond Dial 11/25/2020, 9:41 AM  Mee Hives, PT PhD Acute Rehab Dept. Number: [facility] [facility] [place] [facility]

## 2020-11-26 ENCOUNTER — Telehealth (HOSPITAL_COMMUNITY): Payer: Self-pay | Admitting: Nephrology

## 2020-11-26 NOTE — Telephone Encounter (Signed)
Transition of care contact from inpatient facility  Date of discharge: 11/25/2020 Date of contact: 11/26/2020 Method: Phone Spoke to: Patient  Patient contacted to discuss transition of care from recent inpatient hospitalization. Patient was admitted to [facility] from 10/3 - 11/25/2020 with discharge diagnosis of L hip arthroplasty, GIB, ABLA.  Medication changes were reviewed. Hasn't picked up carafate yet. Knows to take PPI BID  Patient will follow up with his/her outpatient HD unit on: Monday 11/28/2020 - no foreseen issues getting there.  Veneta Penton, PA-C [employer] Pager [phone]

## 2020-11-27 ENCOUNTER — Encounter (HOSPITAL_COMMUNITY): Payer: Self-pay | Admitting: Internal Medicine

## 2020-11-28 ENCOUNTER — Telehealth: Payer: Self-pay

## 2020-11-28 DIAGNOSIS — Z992 Dependence on renal dialysis: Secondary | ICD-10-CM | POA: Diagnosis not present

## 2020-11-28 DIAGNOSIS — D631 Anemia in chronic kidney disease: Secondary | ICD-10-CM | POA: Diagnosis not present

## 2020-11-28 DIAGNOSIS — D509 Iron deficiency anemia, unspecified: Secondary | ICD-10-CM | POA: Diagnosis not present

## 2020-11-28 DIAGNOSIS — N2581 Secondary hyperparathyroidism of renal origin: Secondary | ICD-10-CM | POA: Diagnosis not present

## 2020-11-28 DIAGNOSIS — R519 Headache, unspecified: Secondary | ICD-10-CM | POA: Diagnosis not present

## 2020-11-28 DIAGNOSIS — R197 Diarrhea, unspecified: Secondary | ICD-10-CM | POA: Diagnosis not present

## 2020-11-28 DIAGNOSIS — N186 End stage renal disease: Secondary | ICD-10-CM | POA: Diagnosis not present

## 2020-11-28 DIAGNOSIS — Z96642 Presence of left artificial hip joint: Secondary | ICD-10-CM | POA: Diagnosis not present

## 2020-11-28 DIAGNOSIS — E875 Hyperkalemia: Secondary | ICD-10-CM | POA: Diagnosis not present

## 2020-11-28 NOTE — Telephone Encounter (Signed)
Pt has been scheduled for 12/02/20 at 3:40pm for a follow up with Dr. Candis Schatz. Thank you.

## 2020-11-28 NOTE — Telephone Encounter (Signed)
Left VM for patient to call back and schedule follow up appointment with Dr.Cunningham.

## 2020-11-29 ENCOUNTER — Encounter: Payer: Self-pay | Admitting: Orthopaedic Surgery

## 2020-11-29 ENCOUNTER — Ambulatory Visit (INDEPENDENT_AMBULATORY_CARE_PROVIDER_SITE_OTHER): Payer: Medicare Other | Admitting: Physician Assistant

## 2020-11-29 ENCOUNTER — Other Ambulatory Visit: Payer: Self-pay

## 2020-11-29 DIAGNOSIS — Z96642 Presence of left artificial hip joint: Secondary | ICD-10-CM | POA: Diagnosis not present

## 2020-11-29 DIAGNOSIS — R569 Unspecified convulsions: Secondary | ICD-10-CM | POA: Diagnosis not present

## 2020-11-29 DIAGNOSIS — D374 Neoplasm of uncertain behavior of colon: Secondary | ICD-10-CM | POA: Diagnosis not present

## 2020-11-29 DIAGNOSIS — N2889 Other specified disorders of kidney and ureter: Secondary | ICD-10-CM | POA: Diagnosis not present

## 2020-11-29 DIAGNOSIS — N2581 Secondary hyperparathyroidism of renal origin: Secondary | ICD-10-CM | POA: Diagnosis not present

## 2020-11-29 DIAGNOSIS — E1122 Type 2 diabetes mellitus with diabetic chronic kidney disease: Secondary | ICD-10-CM | POA: Diagnosis not present

## 2020-11-29 DIAGNOSIS — I12 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 stage renal disease: Secondary | ICD-10-CM | POA: Diagnosis not present

## 2020-11-29 DIAGNOSIS — K5790 Diverticulosis of intestine, part unspecified, without perforation or abscess without bleeding: Secondary | ICD-10-CM | POA: Diagnosis not present

## 2020-11-29 DIAGNOSIS — N186 End stage renal disease: Secondary | ICD-10-CM | POA: Diagnosis not present

## 2020-11-29 DIAGNOSIS — R911 Solitary pulmonary nodule: Secondary | ICD-10-CM | POA: Diagnosis not present

## 2020-11-29 DIAGNOSIS — G4489 Other headache syndrome: Secondary | ICD-10-CM | POA: Diagnosis not present

## 2020-11-29 DIAGNOSIS — D630 Anemia in neoplastic disease: Secondary | ICD-10-CM | POA: Diagnosis not present

## 2020-11-29 DIAGNOSIS — K219 Gastro-esophageal reflux disease without esophagitis: Secondary | ICD-10-CM | POA: Diagnosis not present

## 2020-11-29 DIAGNOSIS — Z7982 Long term (current) use of aspirin: Secondary | ICD-10-CM | POA: Diagnosis not present

## 2020-11-29 DIAGNOSIS — E7800 Pure hypercholesterolemia, unspecified: Secondary | ICD-10-CM | POA: Diagnosis not present

## 2020-11-29 DIAGNOSIS — D3703 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: Secondary | ICD-10-CM | POA: Diagnosis not present

## 2020-11-29 DIAGNOSIS — Z79891 Long term (current) use of opiate analgesic: Secondary | ICD-10-CM | POA: Diagnosis not present

## 2020-11-29 DIAGNOSIS — Z471 Aftercare following joint replacement surgery: Secondary | ICD-10-CM | POA: Diagnosis not present

## 2020-11-29 DIAGNOSIS — G031 Chronic meningitis: Secondary | ICD-10-CM | POA: Diagnosis not present

## 2020-11-29 DIAGNOSIS — G4733 Obstructive sleep apnea (adult) (pediatric): Secondary | ICD-10-CM | POA: Diagnosis not present

## 2020-11-29 DIAGNOSIS — E1151 Type 2 diabetes mellitus with diabetic peripheral angiopathy without gangrene: Secondary | ICD-10-CM | POA: Diagnosis not present

## 2020-11-29 DIAGNOSIS — D631 Anemia in chronic kidney disease: Secondary | ICD-10-CM | POA: Diagnosis not present

## 2020-11-29 NOTE — Progress Notes (Signed)
Post-Op Visit Note   Patient: Carl Lenker Sr.           Date of Birth: November 19, 1960           MRN: [id number] Visit Date: 11/29/2020 PCP: [name], MD   Assessment & Plan:  Chief Complaint:  Chief Complaint  Patient presents with   Left Hip - Pain   Visit Diagnoses:  1. Hx of total hip arthroplasty, left     Plan: Patient is a 60 year old gentleman who comes in today 2 weeks status post left total hip replacement 11/14/2020.  Initial postoperative course was complicated by anemia from upper GI bleed.  In regards to the hip he has continued to progress with minimal pain.  He is ambulating with a walker.  Examination of the left hip reveals a fully healed surgical incision with nylon sutures in place.  He does have a small seroma.  This is nontender.  No erythema.  No drainage.  Calves are soft and nontender.  He is neurovascular intact distally.  Today, sutures were removed and Steri-Strips applied.  He will continue to work on a home exercise program.  Dental prophylaxis reinforced.  He will follow-up with [place] in 4 weeks time for repeat evaluation and pelvis x-rays.  Call with concerns or questions.  Follow-Up Instructions: Return in about 4 weeks (around 12/27/2020).   Orders:  No orders of the defined types were placed in this encounter.  No orders of the defined types were placed in this encounter.   Imaging: No new imaging  PMFS History: Patient Active Problem List   Diagnosis Date Noted   Mucosal abnormality of esophagus    Mucosal abnormality of stomach    Mucosal abnormality of duodenum    Upper GI bleed    Left hip pain 11/16/2020   Type 2 diabetes mellitus with other specified complication ([place]) 97/35/3299   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/14/2020   Primary osteoarthritis of left hip 10/07/2020   Status post reverse total arthroplasty of right shoulder 08/24/2020   Diverticulitis 03/08/2020   ESRD on hemodialysis ([facility]) 03/08/2020    Polyneuropathy in diseases classified elsewhere ([place]) 03/08/2020   Pure hypercholesterolemia 03/08/2020   Sacral back pain 03/08/2020   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 11/10/2019   Acute gastric ulcer with hemorrhage    Hypotension of hemodialysis 07/31/2019   Diverticulitis large intestine 07/31/2019   Mass of soft tissue of shoulder 05/26/2019   Awaiting organ transplant status 03/18/2019   Finding of cocaine in blood 02/25/2019   Anaphylactic shock, unspecified, sequela 11/21/2018   Hypercalcemia 10/28/2018   Diarrhea, unspecified 08/08/2018   Hyperkalemia 03/12/2018   Patient is Jehovah's Witness 01/30/2018   Renal cell carcinoma of left kidney ([place]) 12/24/2017   Fluid overload, unspecified 07/06/2017   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   Other specified disorders of kidney and ureter 11/22/2016   Secondary hyperparathyroidism of renal origin ([place]) 10/23/2016   Other headache syndrome 08/21/2016   TIA (transient ischemic attack)    Essential hypertension    Seizures ([place])    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    Lung nodule    Diverticulitis of cecum 07/21/2015   Diverticulitis of large intestine without perforation or abscess without bleeding    Right lower quadrant pain    Obstructive sleep apnea 02/08/2015   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   Diverticulosis 07/13/2014   Lower GI bleed 07/12/2014   Refusal of blood transfusions as patient is [employer]  Witness    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   HLD (hyperlipidemia)    PRES (posterior reversible encephalopathy syndrome)    History of lacunar cerebrovascular accident (CVA) 05/23/2014   Anemia of renal disease 05/23/2014   GERD (gastroesophageal reflux disease) 12/09/2012   Gastrointestinal bleeding 11/12/2012   Melena 11/12/2012   Other psychoactive substance dependence, uncomplicated ([place]) 83/66/2947   Iron  deficiency anemia, unspecified 06/10/2012   Moderate protein-calorie malnutrition ([place]) 07/29/2009   Coagulation defect, unspecified ([place]) 08/29/2006   Type 2 diabetes mellitus with diabetic peripheral angiopathy without gangrene ([place]) 08/29/2006   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 stage renal disease ([place]) 08/02/2006   Past Medical History:  Diagnosis Date   Acute gastric ulcer with hemorrhage    Acute GI bleeding 07/31/2019   Acute pericarditis    Anemia    hx of   Anxiety    situational    Arthritis    on meds   Borderline diabetes    Cataract    bilateral sx   Chronic headaches    Depression    situational    Diverticulitis    ESRD (end stage renal disease) ([place])     On Renal Transplant List," [facility]; MWF" (10/23/2016)   GERD (gastroesophageal reflux disease)    with certain foods   GI bleed    Hypertension    diet controlled   Parathyroid abnormality ([facility])    ectopic parathyroid gland   Presence of arteriovenous fistula for hemodialysis, primary ([place])    RUE   Refusal of blood product    NO WHOLE BLOOD PROUCTS   Renal cell carcinoma ([place])    s/p hand assisted laparoscopic bilateral nephrectomies 11/29/17, + RCC left   Secondary hyperparathyroidism ([place])    Seizures ([place])    one episode in past, due to" elevated Potassium" 08/02/20- "at least 4 years ago"   Sleep apnea    doesn't use CPAP anymore since weight loss   Stroke ([place])    no residual    Family History  Problem Relation Age of Onset   Diabetes Father    Stroke Father    Hypertension Father    Uterine cancer Mother    Lupus Sister    Stroke Sister    Hypertension Sister    Anuerysm Brother        brain   Colon cancer Neg Hx    Esophageal cancer Neg Hx    Stomach cancer Neg Hx    Pancreatic cancer Neg Hx    Liver disease Neg Hx    Colon polyps Neg Hx    Rectal cancer Neg Hx     Past Surgical History:  Procedure Laterality Date   AV FISTULA  PLACEMENT Right    right arm   BIOPSY  08/01/2019   Procedure: BIOPSY;  Surgeon: Juanita Craver, MD;  Location: [facility] ENDOSCOPY;  Service: Endoscopy;;   BIOPSY  11/18/2020   Procedure: BIOPSY;  Surgeon: Daryel November, MD;  Location: [facility] ENDOSCOPY;  Service: Gastroenterology;;   CATARACT EXTRACTION W/ INTRAOCULAR LENS  IMPLANT, BILATERAL     COLON SURGERY     COLONOSCOPY N/A 08/04/2015   Procedure: COLONOSCOPY;  Surgeon: Jerene Bears, MD;  Location: [facility] ENDOSCOPY;  Service: Endoscopy;  Laterality: N/A;   COLONOSCOPY  2017   JMP@ [facility]-good prep-mass -recall 1 yr   COLONOSCOPY WITH PROPOFOL N/A 11/25/2020   Procedure: COLONOSCOPY  WITH PROPOFOL;  Surgeon: Sharyn Creamer, MD;  Location: [place];  Service: Gastroenterology;  Laterality: N/A;   ESOPHAGOGASTRODUODENOSCOPY N/A 08/01/2019   Procedure: ESOPHAGOGASTRODUODENOSCOPY (EGD);  Surgeon: Juanita Craver, MD;  Location: [facility] ENDOSCOPY;  Service: Endoscopy;  Laterality: N/A;   ESOPHAGOGASTRODUODENOSCOPY N/A 11/18/2020   Procedure: ESOPHAGOGASTRODUODENOSCOPY (EGD);  Surgeon: Daryel November, MD;  Location: [place];  Service: Gastroenterology;  Laterality: N/A;   ESOPHAGOGASTRODUODENOSCOPY (EGD) WITH PROPOFOL N/A 08/04/2019   Procedure: ESOPHAGOGASTRODUODENOSCOPY (EGD) WITH PROPOFOL;  Surgeon: Yetta Flock, MD;  Location: [place];  Service: Gastroenterology;  Laterality: N/A;   graft left arm Left    for dialysis x 2. Removed   HOT HEMOSTASIS  11/18/2020   Procedure: HOT HEMOSTASIS (ARGON PLASMA COAGULATION/BICAP);  Surgeon: Daryel November, MD;  Location: [place];  Service: Gastroenterology;;   INSERTION OF DIALYSIS CATHETER     Rt chest   LAPAROSCOPIC RIGHT COLECTOMY N/A 08/05/2015   Procedure: LAPAROSCOPIC RIGHT COLECTOMY- ASCENDING;  Surgeon: Stark Klein, MD;  Location: [place];  Service: General;  Laterality: N/A;   MASS EXCISION Left 05/28/2019   Procedure: EXCISION SOFT TISSUE MASS LEFT SHOULDER;  Surgeon:  Armandina Gemma, MD;  Location: [facility];  Service: General;  Laterality: Left;   NEPHRECTOMY Bilateral    PARATHYROIDECTOMY N/A 06/12/2016   Procedure: TOTAL PARATHYROIDECTOMY WITH AUTOTRANSPLANTATION TO LEFT FOREARM;  Surgeon: Armandina Gemma, MD;  Location: [place];  Service: General;  Laterality: N/A;   PARATHYROIDECTOMY N/A 10/23/2016   Procedure: PARATHYROIDECTOMY;  Surgeon: Armandina Gemma, MD;  Location: [place];  Service: General;  Laterality: N/A;   REVERSE SHOULDER ARTHROPLASTY Right 08/24/2020   Procedure: REVERSE SHOULDER ARTHROPLASTY;  Surgeon: Hiram Gash, MD;  Location: [place];  Service: Orthopedics;  Laterality: Right;   REVISON OF ARTERIOVENOUS FISTULA Right 07/16/2017   Procedure: REVISION OF ARTERIOVENOUS FISTULA  Right ARM;  Surgeon: Waynetta Sandy, MD;  Location: [place];  Service: Vascular;  Laterality: Right;   TOTAL HIP ARTHROPLASTY Left 11/14/2020   Procedure: LEFT TOTAL HIP ARTHROPLASTY ANTERIOR APPROACH;  Surgeon: Leandrew Koyanagi, MD;  Location: [place];  Service: Orthopedics;  Laterality: Left;   UPPER GASTROINTESTINAL ENDOSCOPY  2021   @ [facility]   Social History   Occupational History   Occupation: retired    Fish farm manager: SELF EMPLOYED  Tobacco Use   Smoking status: Former    Types: Cigarettes    Quit date: 06/19/1990    Years since quitting: 30.4   Smokeless tobacco: Never   Tobacco comments:    rarely cigar  Vaping Use   Vaping Use: Never used  Substance and Sexual Activity   Alcohol use: Yes    Alcohol/week: 1.0 standard drink    Types: 1 Cans of beer per week    Comment: occasional beer   Drug use: Not Currently    Types: Marijuana    Comment: Last use several yrs ago   Sexual activity: Not Currently

## 2020-11-30 ENCOUNTER — Telehealth: Payer: Self-pay | Admitting: Orthopaedic Surgery

## 2020-11-30 DIAGNOSIS — Z992 Dependence on renal dialysis: Secondary | ICD-10-CM | POA: Diagnosis not present

## 2020-11-30 DIAGNOSIS — R519 Headache, unspecified: Secondary | ICD-10-CM | POA: Diagnosis not present

## 2020-11-30 DIAGNOSIS — N2581 Secondary hyperparathyroidism of renal origin: Secondary | ICD-10-CM | POA: Diagnosis not present

## 2020-11-30 DIAGNOSIS — R197 Diarrhea, unspecified: Secondary | ICD-10-CM | POA: Diagnosis not present

## 2020-11-30 DIAGNOSIS — E875 Hyperkalemia: Secondary | ICD-10-CM | POA: Diagnosis not present

## 2020-11-30 DIAGNOSIS — D631 Anemia in chronic kidney disease: Secondary | ICD-10-CM | POA: Diagnosis not present

## 2020-11-30 DIAGNOSIS — D509 Iron deficiency anemia, unspecified: Secondary | ICD-10-CM | POA: Diagnosis not present

## 2020-11-30 DIAGNOSIS — M1612 Unilateral primary osteoarthritis, left hip: Secondary | ICD-10-CM | POA: Diagnosis not present

## 2020-11-30 DIAGNOSIS — N186 End stage renal disease: Secondary | ICD-10-CM | POA: Diagnosis not present

## 2020-11-30 NOTE — Telephone Encounter (Signed)
Thank you :)

## 2020-11-30 NOTE — Telephone Encounter (Signed)
Ok cool.  So he's not doing any PT now?

## 2020-11-30 NOTE — Telephone Encounter (Signed)
Sheri, can you address this with him please.  Does he need PT 3x a week?  Thanks.

## 2020-11-30 NOTE — Telephone Encounter (Signed)
[name] with [facility] called with verbal orders with the frequency of three times a week for 2 weeks. She also wanted to make Dr.Xu aware the pt has been driving since he was d/c from the hospital.  [facility] CB# [phone] to LVM

## 2020-12-01 ENCOUNTER — Telehealth: Payer: Self-pay | Admitting: *Deleted

## 2020-12-01 NOTE — Telephone Encounter (Signed)
Ortho bundle call completed. 

## 2020-12-02 ENCOUNTER — Ambulatory Visit: Payer: Medicare Other | Admitting: Gastroenterology

## 2020-12-03 DIAGNOSIS — Z992 Dependence on renal dialysis: Secondary | ICD-10-CM | POA: Diagnosis not present

## 2020-12-03 DIAGNOSIS — R197 Diarrhea, unspecified: Secondary | ICD-10-CM | POA: Diagnosis not present

## 2020-12-03 DIAGNOSIS — N186 End stage renal disease: Secondary | ICD-10-CM | POA: Diagnosis not present

## 2020-12-03 DIAGNOSIS — N2581 Secondary hyperparathyroidism of renal origin: Secondary | ICD-10-CM | POA: Diagnosis not present

## 2020-12-03 DIAGNOSIS — D631 Anemia in chronic kidney disease: Secondary | ICD-10-CM | POA: Diagnosis not present

## 2020-12-03 DIAGNOSIS — D509 Iron deficiency anemia, unspecified: Secondary | ICD-10-CM | POA: Diagnosis not present

## 2020-12-03 DIAGNOSIS — E875 Hyperkalemia: Secondary | ICD-10-CM | POA: Diagnosis not present

## 2020-12-03 DIAGNOSIS — R519 Headache, unspecified: Secondary | ICD-10-CM | POA: Diagnosis not present

## 2020-12-05 DIAGNOSIS — R197 Diarrhea, unspecified: Secondary | ICD-10-CM | POA: Diagnosis not present

## 2020-12-05 DIAGNOSIS — R519 Headache, unspecified: Secondary | ICD-10-CM | POA: Diagnosis not present

## 2020-12-05 DIAGNOSIS — N186 End stage renal disease: Secondary | ICD-10-CM | POA: Diagnosis not present

## 2020-12-05 DIAGNOSIS — D509 Iron deficiency anemia, unspecified: Secondary | ICD-10-CM | POA: Diagnosis not present

## 2020-12-05 DIAGNOSIS — D631 Anemia in chronic kidney disease: Secondary | ICD-10-CM | POA: Diagnosis not present

## 2020-12-05 DIAGNOSIS — N2581 Secondary hyperparathyroidism of renal origin: Secondary | ICD-10-CM | POA: Diagnosis not present

## 2020-12-05 DIAGNOSIS — E875 Hyperkalemia: Secondary | ICD-10-CM | POA: Diagnosis not present

## 2020-12-05 DIAGNOSIS — Z992 Dependence on renal dialysis: Secondary | ICD-10-CM | POA: Diagnosis not present

## 2020-12-07 DIAGNOSIS — D509 Iron deficiency anemia, unspecified: Secondary | ICD-10-CM | POA: Diagnosis not present

## 2020-12-07 DIAGNOSIS — E875 Hyperkalemia: Secondary | ICD-10-CM | POA: Diagnosis not present

## 2020-12-07 DIAGNOSIS — N186 End stage renal disease: Secondary | ICD-10-CM | POA: Diagnosis not present

## 2020-12-07 DIAGNOSIS — R197 Diarrhea, unspecified: Secondary | ICD-10-CM | POA: Diagnosis not present

## 2020-12-07 DIAGNOSIS — Z992 Dependence on renal dialysis: Secondary | ICD-10-CM | POA: Diagnosis not present

## 2020-12-07 DIAGNOSIS — N2581 Secondary hyperparathyroidism of renal origin: Secondary | ICD-10-CM | POA: Diagnosis not present

## 2020-12-07 DIAGNOSIS — R519 Headache, unspecified: Secondary | ICD-10-CM | POA: Diagnosis not present

## 2020-12-07 DIAGNOSIS — D631 Anemia in chronic kidney disease: Secondary | ICD-10-CM | POA: Diagnosis not present

## 2020-12-09 DIAGNOSIS — R519 Headache, unspecified: Secondary | ICD-10-CM | POA: Diagnosis not present

## 2020-12-09 DIAGNOSIS — Z992 Dependence on renal dialysis: Secondary | ICD-10-CM | POA: Diagnosis not present

## 2020-12-09 DIAGNOSIS — E875 Hyperkalemia: Secondary | ICD-10-CM | POA: Diagnosis not present

## 2020-12-09 DIAGNOSIS — D509 Iron deficiency anemia, unspecified: Secondary | ICD-10-CM | POA: Diagnosis not present

## 2020-12-09 DIAGNOSIS — D631 Anemia in chronic kidney disease: Secondary | ICD-10-CM | POA: Diagnosis not present

## 2020-12-09 DIAGNOSIS — R197 Diarrhea, unspecified: Secondary | ICD-10-CM | POA: Diagnosis not present

## 2020-12-09 DIAGNOSIS — N186 End stage renal disease: Secondary | ICD-10-CM | POA: Diagnosis not present

## 2020-12-09 DIAGNOSIS — N2581 Secondary hyperparathyroidism of renal origin: Secondary | ICD-10-CM | POA: Diagnosis not present

## 2020-12-09 DIAGNOSIS — H6123 Impacted cerumen, bilateral: Secondary | ICD-10-CM | POA: Diagnosis not present

## 2020-12-12 DIAGNOSIS — R197 Diarrhea, unspecified: Secondary | ICD-10-CM | POA: Diagnosis not present

## 2020-12-12 DIAGNOSIS — R519 Headache, unspecified: Secondary | ICD-10-CM | POA: Diagnosis not present

## 2020-12-12 DIAGNOSIS — Z992 Dependence on renal dialysis: Secondary | ICD-10-CM | POA: Diagnosis not present

## 2020-12-12 DIAGNOSIS — I12 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 stage renal disease: Secondary | ICD-10-CM | POA: Diagnosis not present

## 2020-12-12 DIAGNOSIS — D509 Iron deficiency anemia, unspecified: Secondary | ICD-10-CM | POA: Diagnosis not present

## 2020-12-12 DIAGNOSIS — N2581 Secondary hyperparathyroidism of renal origin: Secondary | ICD-10-CM | POA: Diagnosis not present

## 2020-12-12 DIAGNOSIS — N186 End stage renal disease: Secondary | ICD-10-CM | POA: Diagnosis not present

## 2020-12-12 DIAGNOSIS — D631 Anemia in chronic kidney disease: Secondary | ICD-10-CM | POA: Diagnosis not present

## 2020-12-12 DIAGNOSIS — E875 Hyperkalemia: Secondary | ICD-10-CM | POA: Diagnosis not present

## 2020-12-14 DIAGNOSIS — N186 End stage renal disease: Secondary | ICD-10-CM | POA: Diagnosis not present

## 2020-12-14 DIAGNOSIS — N2581 Secondary hyperparathyroidism of renal origin: Secondary | ICD-10-CM | POA: Diagnosis not present

## 2020-12-14 DIAGNOSIS — D631 Anemia in chronic kidney disease: Secondary | ICD-10-CM | POA: Diagnosis not present

## 2020-12-14 DIAGNOSIS — Z992 Dependence on renal dialysis: Secondary | ICD-10-CM | POA: Diagnosis not present

## 2020-12-14 DIAGNOSIS — D509 Iron deficiency anemia, unspecified: Secondary | ICD-10-CM | POA: Diagnosis not present

## 2020-12-14 DIAGNOSIS — E875 Hyperkalemia: Secondary | ICD-10-CM | POA: Diagnosis not present

## 2020-12-16 DIAGNOSIS — E875 Hyperkalemia: Secondary | ICD-10-CM | POA: Diagnosis not present

## 2020-12-16 DIAGNOSIS — Z992 Dependence on renal dialysis: Secondary | ICD-10-CM | POA: Diagnosis not present

## 2020-12-16 DIAGNOSIS — D509 Iron deficiency anemia, unspecified: Secondary | ICD-10-CM | POA: Diagnosis not present

## 2020-12-16 DIAGNOSIS — D631 Anemia in chronic kidney disease: Secondary | ICD-10-CM | POA: Diagnosis not present

## 2020-12-16 DIAGNOSIS — N186 End stage renal disease: Secondary | ICD-10-CM | POA: Diagnosis not present

## 2020-12-16 DIAGNOSIS — N2581 Secondary hyperparathyroidism of renal origin: Secondary | ICD-10-CM | POA: Diagnosis not present

## 2020-12-19 DIAGNOSIS — N2581 Secondary hyperparathyroidism of renal origin: Secondary | ICD-10-CM | POA: Diagnosis not present

## 2020-12-19 DIAGNOSIS — Z992 Dependence on renal dialysis: Secondary | ICD-10-CM | POA: Diagnosis not present

## 2020-12-19 DIAGNOSIS — N186 End stage renal disease: Secondary | ICD-10-CM | POA: Diagnosis not present

## 2020-12-19 DIAGNOSIS — E875 Hyperkalemia: Secondary | ICD-10-CM | POA: Diagnosis not present

## 2020-12-19 DIAGNOSIS — D631 Anemia in chronic kidney disease: Secondary | ICD-10-CM | POA: Diagnosis not present

## 2020-12-19 DIAGNOSIS — D509 Iron deficiency anemia, unspecified: Secondary | ICD-10-CM | POA: Diagnosis not present

## 2020-12-20 ENCOUNTER — Ambulatory Visit: Admit: 2020-12-20 | Payer: Medicare Other | Admitting: Orthopedic Surgery

## 2020-12-20 SURGERY — ARTHROPLASTY, HIP, TOTAL,POSTERIOR APPROACH
Anesthesia: Choice | Site: Hip | Laterality: Left

## 2020-12-21 DIAGNOSIS — Z992 Dependence on renal dialysis: Secondary | ICD-10-CM | POA: Diagnosis not present

## 2020-12-21 DIAGNOSIS — D631 Anemia in chronic kidney disease: Secondary | ICD-10-CM | POA: Diagnosis not present

## 2020-12-21 DIAGNOSIS — E875 Hyperkalemia: Secondary | ICD-10-CM | POA: Diagnosis not present

## 2020-12-21 DIAGNOSIS — N2581 Secondary hyperparathyroidism of renal origin: Secondary | ICD-10-CM | POA: Diagnosis not present

## 2020-12-21 DIAGNOSIS — D509 Iron deficiency anemia, unspecified: Secondary | ICD-10-CM | POA: Diagnosis not present

## 2020-12-21 DIAGNOSIS — N186 End stage renal disease: Secondary | ICD-10-CM | POA: Diagnosis not present

## 2020-12-23 ENCOUNTER — Encounter: Payer: Self-pay | Admitting: Gastroenterology

## 2020-12-23 ENCOUNTER — Ambulatory Visit (INDEPENDENT_AMBULATORY_CARE_PROVIDER_SITE_OTHER): Payer: Medicare Other | Admitting: Gastroenterology

## 2020-12-23 VITALS — BP 144/82 | HR 62 | Ht 73.0 in | Wt 194.0 lb

## 2020-12-23 DIAGNOSIS — D509 Iron deficiency anemia, unspecified: Secondary | ICD-10-CM | POA: Diagnosis not present

## 2020-12-23 DIAGNOSIS — Z992 Dependence on renal dialysis: Secondary | ICD-10-CM

## 2020-12-23 DIAGNOSIS — N2581 Secondary hyperparathyroidism of renal origin: Secondary | ICD-10-CM | POA: Diagnosis not present

## 2020-12-23 DIAGNOSIS — K250 Acute gastric ulcer with hemorrhage: Secondary | ICD-10-CM | POA: Diagnosis not present

## 2020-12-23 DIAGNOSIS — E875 Hyperkalemia: Secondary | ICD-10-CM | POA: Diagnosis not present

## 2020-12-23 DIAGNOSIS — Z1211 Encounter for screening for malignant neoplasm of colon: Secondary | ICD-10-CM

## 2020-12-23 DIAGNOSIS — D631 Anemia in chronic kidney disease: Secondary | ICD-10-CM

## 2020-12-23 DIAGNOSIS — Z1212 Encounter for screening for malignant neoplasm of rectum: Secondary | ICD-10-CM

## 2020-12-23 DIAGNOSIS — N186 End stage renal disease: Secondary | ICD-10-CM | POA: Diagnosis not present

## 2020-12-23 MED ORDER — PLENVU 140 G PO SOLR: ORAL | 0 refills | Status: DC

## 2020-12-23 NOTE — Patient Instructions (Signed)
If you are age 62 or older, your body mass index should be between 23-30. Your Body mass index is 25.6 kg/m. If this is out of the aforementioned range listed, please consider follow up with your Primary Care Provider.  If you are age 30 or younger, your body mass index should be between 19-25. Your Body mass index is 25.6 kg/m. If this is out of the aformentioned range listed, please consider follow up with your Primary Care Provider.   You have been scheduled for a colonoscopy. Please follow written instructions given to you at your visit today.  Please pick up your prep supplies at the pharmacy within the next 1-3 days. If you use inhalers (even only as needed), please bring them with you on the day of your procedure.    The [facility] providers would like to encourage you to use [facility] to communicate with providers for non-urgent requests or questions.  Due to long hold times on the telephone, sending your provider a message by [facility] may be a faster and more efficient way to get a response.  Please allow 48 business hours for a response.  Please remember that this is for non-urgent requests.   It was a pleasure to see you today!  Thank you for trusting me with your gastrointestinal care!    Scott E.Candis Schatz, MD

## 2020-12-23 NOTE — Progress Notes (Signed)
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  Patient underwent a left hip arthroplasty in early October of this year, and experienced an upper GI bleed in the few days after his surgery.  His upper GI bleed manifested with melanic stool and acute drop in hemoglobin.  An upper endoscopy performed in the hospital on October 7 was notable for a small ulcer with visible vessel in the prepyloric stomach, which was treated with bipolar cauterization.  Following this procedure, his hemoglobin continued to dwindle while he was an inpatient, although he had no further evidence of overt GI bleeding.  He underwent a colonoscopy on October 14 which was limited by a poor bowel prep, but which was negative for any overt blood or GI bleeding. He was discharged home with a hemoglobin of 8.8.  The patient states that at dialysis this week, his hemoglobin was noted to be 7.7.  He states that he has not been getting his erythropoietin injections since has been in the hospital.  Patient states that his bowel movements have been normal and denies any of the black tarry stools that he was seen when his hemoglobin had acutely dropped following his arthroplasty.  He reports good appetite and stable weight.  He occasionally has some nausea, but no abdominal pain, no heartburn, no nausea or vomiting.  He denies feeling weak dizzy or lightheaded.   Past Medical History:  Diagnosis Date   Acute gastric ulcer with hemorrhage    Acute GI bleeding 07/31/2019   Acute pericarditis    Anemia    hx of   Anxiety    situational    Arthritis    on meds   Borderline diabetes    Cataract    bilateral sx   Chronic headaches    Depression    situational    Diverticulitis    ESRD (end stage renal  disease) ([place])     On Renal Transplant List," [facility]; MWF" (10/23/2016)   GERD (gastroesophageal reflux disease)    with certain foods   GI bleed    Hypertension    diet controlled   Parathyroid abnormality ([place])    ectopic parathyroid gland   Presence of arteriovenous fistula for hemodialysis, primary ([place])    RUE   Refusal of blood product    NO WHOLE BLOOD PROUCTS   Renal cell carcinoma ([place])    s/p hand assisted laparoscopic bilateral nephrectomies 11/29/17, + RCC left   Secondary hyperparathyroidism ([place])    Seizures ([place])    one episode in past, due to" elevated Potassium" 08/02/20- "at least 4 years ago"   Sleep apnea    doesn't use CPAP anymore since weight loss   Stroke [facility])    no residual     Past Surgical History:  Procedure Laterality Date   AV FISTULA PLACEMENT Right    right arm   BIOPSY  08/01/2019   Procedure: BIOPSY;  Surgeon: Juanita Craver, MD;  Location: [facility] ENDOSCOPY;  Service: Endoscopy;;   BIOPSY  11/18/2020   Procedure: BIOPSY;  Surgeon: Daryel November, MD;  Location: [facility] ENDOSCOPY;  Service: Gastroenterology;;   CATARACT EXTRACTION W/ INTRAOCULAR LENS  IMPLANT, BILATERAL     COLON SURGERY     COLONOSCOPY N/A 08/04/2015   Procedure: COLONOSCOPY;  Surgeon: Jerene Bears,  MD;  Location: [place] ENDOSCOPY;  Service: Endoscopy;  Laterality: N/A;   COLONOSCOPY  2017   JMP@ [facility]-good prep-mass -recall 1 yr   COLONOSCOPY WITH PROPOFOL N/A 11/25/2020   Procedure: COLONOSCOPY WITH PROPOFOL;  Surgeon: Sharyn Creamer, MD;  Location: [place];  Service: Gastroenterology;  Laterality: N/A;   ESOPHAGOGASTRODUODENOSCOPY N/A 08/01/2019   Procedure: ESOPHAGOGASTRODUODENOSCOPY (EGD);  Surgeon: Juanita Craver, MD;  Location: [facility] ENDOSCOPY;  Service: Endoscopy;  Laterality: N/A;   ESOPHAGOGASTRODUODENOSCOPY N/A 11/18/2020   Procedure: ESOPHAGOGASTRODUODENOSCOPY (EGD);  Surgeon: Daryel November, MD;  Location: [place];  Service: Gastroenterology;  Laterality: N/A;    ESOPHAGOGASTRODUODENOSCOPY (EGD) WITH PROPOFOL N/A 08/04/2019   Procedure: ESOPHAGOGASTRODUODENOSCOPY (EGD) WITH PROPOFOL;  Surgeon: Yetta Flock, MD;  Location: [place];  Service: Gastroenterology;  Laterality: N/A;   graft left arm Left    for dialysis x 2. Removed   HOT HEMOSTASIS  11/18/2020   Procedure: HOT HEMOSTASIS (ARGON PLASMA COAGULATION/BICAP);  Surgeon: Daryel November, MD;  Location: [place];  Service: Gastroenterology;;   INSERTION OF DIALYSIS CATHETER     Rt chest   LAPAROSCOPIC RIGHT COLECTOMY N/A 08/05/2015   Procedure: LAPAROSCOPIC RIGHT COLECTOMY- ASCENDING;  Surgeon: Stark Klein, MD;  Location: [place];  Service: General;  Laterality: N/A;   MASS EXCISION Left 05/28/2019   Procedure: EXCISION SOFT TISSUE MASS LEFT SHOULDER;  Surgeon: Armandina Gemma, MD;  Location: [facility];  Service: General;  Laterality: Left;   NEPHRECTOMY Bilateral    PARATHYROIDECTOMY N/A 06/12/2016   Procedure: TOTAL PARATHYROIDECTOMY WITH AUTOTRANSPLANTATION TO LEFT FOREARM;  Surgeon: Armandina Gemma, MD;  Location: [place];  Service: General;  Laterality: N/A;   PARATHYROIDECTOMY N/A 10/23/2016   Procedure: PARATHYROIDECTOMY;  Surgeon: Armandina Gemma, MD;  Location: [place];  Service: General;  Laterality: N/A;   REVERSE SHOULDER ARTHROPLASTY Right 08/24/2020   Procedure: REVERSE SHOULDER ARTHROPLASTY;  Surgeon: Hiram Gash, MD;  Location: [place];  Service: Orthopedics;  Laterality: Right;   REVISON OF ARTERIOVENOUS FISTULA Right 07/16/2017   Procedure: REVISION OF ARTERIOVENOUS FISTULA  Right ARM;  Surgeon: Waynetta Sandy, MD;  Location: [place];  Service: Vascular;  Laterality: Right;   TOTAL HIP ARTHROPLASTY Left 11/14/2020   Procedure: LEFT TOTAL HIP ARTHROPLASTY ANTERIOR APPROACH;  Surgeon: Leandrew Koyanagi, MD;  Location: [place];  Service: Orthopedics;  Laterality: Left;   UPPER GASTROINTESTINAL ENDOSCOPY  2021   @ [facility]   Family History  Problem Relation Age of Onset    Diabetes Father    Stroke Father    Hypertension Father    Uterine cancer Mother    Lupus Sister    Stroke Sister    Hypertension Sister    Anuerysm Brother        brain   Colon cancer Neg Hx    Esophageal cancer Neg Hx    Stomach cancer Neg Hx    Pancreatic cancer Neg Hx    Liver disease Neg Hx    Colon polyps Neg Hx    Rectal cancer Neg Hx    Social History   Tobacco Use   Smoking status: Former    Types: Cigarettes    Quit date: 06/19/1990    Years since quitting: 30.5   Smokeless tobacco: Never   Tobacco comments:    rarely cigar  Vaping Use   Vaping Use: Never used  Substance Use Topics   Alcohol use: Yes    Alcohol/week: 1.0 standard drink    Types: 1 Cans of beer per  week    Comment: occasional beer   Drug use: Not Currently    Types: Marijuana    Comment: Last use several yrs ago   Current Outpatient Medications  Medication Sig Dispense Refill   topiramate (TOPAMAX) 25 MG tablet Take 1 tablet (25 mg total) by mouth 2 (two) times daily. 180 tablet 3   vitamin E 180 MG (400 UNITS) capsule Take 400 Units by mouth daily.     atorvastatin (LIPITOR) 10 MG tablet Take 10 mg by mouth every evening.     B Complex-C-Folic Acid (DIALYVITE PO) Take 1 tablet by mouth daily.     camphor-menthol (SARNA) lotion Apply topically 2 (two) times daily. 222 mL 0   cinacalcet (SENSIPAR) 60 MG tablet Take 120 mg by mouth daily with supper.     cromolyn (OPTICROM) 4 % ophthalmic solution Place 1 drop into both eyes daily as needed (redness).     docusate sodium (COLACE) 100 MG capsule Take 1 capsule (100 mg total) by mouth daily as needed. (Patient taking differently: Take 100 mg by mouth in the morning and at bedtime.) 30 capsule 2   midodrine (PROAMATINE) 10 MG tablet Take 10 mg by mouth See admin instructions. Take 10 mg by mouth three times a week as directed, bring to dialysis. May need an additional tablet during treatment.     No current facility-administered medications for  this visit.   Allergies  Allergen Reactions   Infed [Iron Dextran] Other (See Comments)    Decreased BP    Oxycodone Nausea Only   Vicodin [Hydrocodone-Acetaminophen] Other (See Comments)    Decreased BP   Hydrocodone-Acetaminophen     Other reaction(s): lower BP   Diclofenac Other (See Comments)    Unknown   November 25, 2020: Colonoscopy: (Dr. Lorenso Courier) inadequate prep, patent side-to-side ileocolonic anastomosis, diverticulosis in the sigmoid, descending and transverse colon, no evidence of active bleeding or blood in the colon  November 18, 2020: Upper endoscopy: (Dr. 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 pylori negative), nonbleeding gastric ulcer with adherent clot in the pylorus, treated with bipolar cauterization, nonbleeding duodenal diverticulum, congested duodenal mucosa  August 04, 2019: Upper endoscopy: (Dr. Havery Moros) normal esophagus, localized polypoid mucosa with superficial erythema, negative for bleeding, clean-based gastric ulcer in the pyloric channel, patchy erythematous mucosa with hypertrophied folds in the duodenal bulb and second portion  August 01, 2019: Upper endoscopy: (Dr. Collene Mares) gastric polyp in cardia, gastric ulcer in prepyloric region, patchy gastritis, chronic duodenitis  August 04, 2015: Colonoscopy (Dr. Hilarie Fredrickson) ulcerated partially obstructing large mass in the proximal ascending colon, very near the ileocecal valve, moderate diverticulosis from descending to sigmoid colon  July 18, 2010: Colonoscopy (Dr. Deatra Ina) moderate diverticulosis from sigmoid to ascending colon, otherwise normal  Review of Systems: All systems reviewed and negative except where noted in HPI.    No results found.  Physical Exam: BP (!) 144/82   Pulse 62   Ht 6\' 1"  (1.854 m)   Wt 194 lb (88 kg)   BMI 25.60 kg/m  Constitutional: Pleasant,well-developed,  African-American male in no acute distress. HEENT: Normocephalic and atraumatic. Conjunctivae are normal. No scleral icterus. Cardiovascular: Normal rate, regular rhythm.  Pulmonary/chest: Effort normal and breath sounds normal. No wheezing, rales or rhonchi. Abdominal: Soft, nondistended, nontender. Bowel sounds active throughout. There are no masses palpable. No hepatomegaly. Extremities: 1+ bilateral lower extremity edema, right AV fistula Neurological: Alert and  oriented to person place and time. Skin: Skin is warm and dry. No rashes noted. Psychiatric: Normal mood and affect. Behavior is normal.  CBC    Component Value Date/Time   WBC 5.8 11/25/2020 0259   RBC 3.15 (L) 11/25/2020 0259   HGB 8.8 (L) 11/25/2020 0259   HCT 28.4 (L) 11/25/2020 0259   PLT 376 11/25/2020 0259   MCV 90.2 11/25/2020 0259   MCH 27.9 11/25/2020 0259   MCHC 31.0 11/25/2020 0259   RDW 15.4 11/25/2020 0259   LYMPHSABS 0.6 (L) 11/25/2020 0259   MONOABS 0.9 11/25/2020 0259   EOSABS 0.5 11/25/2020 0259   BASOSABS 0.1 11/25/2020 0259    CMP     Component Value Date/Time   NA 133 (L) 11/24/2020 0242   K 4.1 11/24/2020 0242   CL 94 (L) 11/24/2020 0242   CO2 30 11/24/2020 0242   GLUCOSE 95 11/24/2020 0242   BUN 26 (H) 11/24/2020 0242   CREATININE 5.60 (H) 11/24/2020 0242   CREATININE 11.39 (HH) 12/09/2012 1221   CALCIUM 8.3 (L) 11/24/2020 0242   CALCIUM 8.0 (L) 08/02/2006 1133   PROT 7.8 11/10/2020 0930   ALBUMIN 1.9 (L) 11/24/2020 0242   AST 17 11/10/2020 0930   ALT 11 11/10/2020 0930   ALKPHOS 70 11/10/2020 0930   BILITOT 0.5 11/10/2020 0930   GFRNONAA 11 (L) 11/24/2020 0242   GFRAA 5 (L) 08/05/2019 0217     ASSESSMENT AND PLAN: 61 year old male status post bilateral nephrectomy on dialysis with recent upper GI bleed secondary to pyloric ulcer in the immediate postoperative period following a left hip arthroplasty.  He continued to have dwindling hemoglobin in the absence of overt bleeding  afterwards.  Since his discharge a month ago, his hemoglobin appears to have dwindled again (8.8 on discharge October 14, patient states it was 7.2 dialysis this week).  Patient states that he has not been getting his ESA injections for unclear reasons until just recently.  Given the absence of overt bleeding like he had in the hospital, I have a low suspicion that his recent decline in hemoglobin is due to recurrent GI bleeding, especially given his recent major surgery and interruption in ESA therapy.  The patient states he typically gets IV iron on occasion through dialysis, and I would recommend he continue this.  If his hemoglobin continues to drop, then a capsule endoscopy could be performed. The patient does need a repeat EGD to assess healing of the gastric ulcer seen in October.  He also needs a repeat colonoscopy for colon cancer screening, as his prep was inadequate when it was done in the hospital.  The patient states that this was because his prep was not given to him until very late in the evening, and he had eaten earlier that day. Will plan for EGD/colonoscopy to be performed in the hospital setting given his dialysis-dependency.  Gastric ulcer - Continue PPI, okay to take once daily now - EGD in hospital to assess healing of ulcer - Continue to avoid NSAIDs  Anemia - Multifactorial, but do not suspect repeat GI bleed given absence of overt bleeding - IV iron per nephrology - Resume ESA  - If anemia worsens despite IV iron/resumption of ESA, would recommend capsule endoscopy  Colon cancer screening - Colonoscopy in hospital at time of EGD - Ok for standard prep as patient insists that inadequate prep was results of nonstandard prep regimen in the hospital  The details, risks (including bleeding, perforation, infection, missed lesions, medication  reactions and possible hospitalization or surgery if complications occur), benefits, and alternatives to EGD/colonoscopy with possible  biopsy and possible polypectomy were discussed with the patient and he consents to proceed.   Thailan Sava E. Candis Schatz, MD [facility]    CC:  [name], MD

## 2020-12-26 DIAGNOSIS — D509 Iron deficiency anemia, unspecified: Secondary | ICD-10-CM | POA: Diagnosis not present

## 2020-12-26 DIAGNOSIS — E875 Hyperkalemia: Secondary | ICD-10-CM | POA: Diagnosis not present

## 2020-12-26 DIAGNOSIS — D631 Anemia in chronic kidney disease: Secondary | ICD-10-CM | POA: Diagnosis not present

## 2020-12-26 DIAGNOSIS — N186 End stage renal disease: Secondary | ICD-10-CM | POA: Diagnosis not present

## 2020-12-26 DIAGNOSIS — N2581 Secondary hyperparathyroidism of renal origin: Secondary | ICD-10-CM | POA: Diagnosis not present

## 2020-12-26 DIAGNOSIS — Z992 Dependence on renal dialysis: Secondary | ICD-10-CM | POA: Diagnosis not present

## 2020-12-27 ENCOUNTER — Ambulatory Visit (INDEPENDENT_AMBULATORY_CARE_PROVIDER_SITE_OTHER): Payer: Medicare Other | Admitting: Orthopaedic Surgery

## 2020-12-27 ENCOUNTER — Encounter: Payer: Self-pay | Admitting: Orthopaedic Surgery

## 2020-12-27 ENCOUNTER — Other Ambulatory Visit: Payer: Self-pay | Admitting: Physician Assistant

## 2020-12-27 ENCOUNTER — Ambulatory Visit (INDEPENDENT_AMBULATORY_CARE_PROVIDER_SITE_OTHER): Payer: Medicare Other

## 2020-12-27 ENCOUNTER — Telehealth: Payer: Self-pay | Admitting: Orthopaedic Surgery

## 2020-12-27 ENCOUNTER — Other Ambulatory Visit: Payer: Self-pay

## 2020-12-27 DIAGNOSIS — Z96642 Presence of left artificial hip joint: Secondary | ICD-10-CM

## 2020-12-27 MED ORDER — AMOXICILLIN 500 MG PO CAPS: ORAL_CAPSULE | ORAL | 2 refills | Status: DC

## 2020-12-27 MED ORDER — OXYCODONE HCL 5 MG PO CAPS: 5.0000 mg | ORAL_CAPSULE | Freq: Two times a day (BID) | ORAL | 0 refills | Status: DC | PRN

## 2020-12-27 MED ORDER — OXYCODONE HCL 5 MG PO TABS: 5.0000 mg | ORAL_TABLET | Freq: Two times a day (BID) | ORAL | 0 refills | Status: DC | PRN

## 2020-12-27 NOTE — Telephone Encounter (Signed)
[name] with [facility] called stating the pts insurance doesn't cover his rx in the form of capsules but they do as tablets. The pharmacy would like a new rx sent for the oxycodone 5 mg as tablets please.   Pharm# [phone]

## 2020-12-27 NOTE — Progress Notes (Signed)
Post-Op Visit Note   Patient: Carl Wintle Sr.           Date of Birth: 20-Aug-1960           MRN: [id number] Visit Date: 12/27/2020 PCP: [name], MD   Assessment & Plan:  Chief Complaint:  Chief Complaint  Patient presents with   Left Hip - Pain   Visit Diagnoses:  1. Hx of total hip arthroplasty, left     Plan: Patient is a pleasant 60 year old gentleman who comes in today 6 weeks status post left total hip replacement 11/14/2020.  He has been doing well.  He is ambulating unassisted.  He has minimal pain to the left hip.  At this point, he will continue to increase activity as tolerated.  Dental prophylaxis reinforced.  He will follow-up with [place] in 6 weeks time for repeat evaluation.  Call with concerns or questions in the meantime.  Follow-Up Instructions: No follow-ups on file.   Orders:  Orders Placed This Encounter  Procedures   XR Pelvis 1-2 Views   No orders of the defined types were placed in this encounter.   Imaging: No results found.  PMFS History: Patient Active Problem List   Diagnosis Date Noted   Mucosal abnormality of esophagus    Mucosal abnormality of stomach    Mucosal abnormality of duodenum    Upper GI bleed    Left hip pain 11/16/2020   Type 2 diabetes mellitus with other specified complication ([place]) 26/37/8588   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/14/2020   Primary osteoarthritis of left hip 10/07/2020   Status post reverse total arthroplasty of right shoulder 08/24/2020   Diverticulitis 03/08/2020   ESRD on hemodialysis ([place]) 03/08/2020   Polyneuropathy in diseases classified elsewhere ([place]) 03/08/2020   Pure hypercholesterolemia 03/08/2020   Sacral back pain 03/08/2020   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 11/10/2019   Acute gastric ulcer with hemorrhage    Hypotension of hemodialysis 07/31/2019   Diverticulitis large intestine 07/31/2019   Mass of soft tissue of shoulder 05/26/2019   Awaiting organ transplant  status 03/18/2019   Finding of cocaine in blood 02/25/2019   Anaphylactic shock, unspecified, sequela 11/21/2018   Hypercalcemia 10/28/2018   Diarrhea, unspecified 08/08/2018   Hyperkalemia 03/12/2018   Patient is Jehovah's Witness 01/30/2018   Renal cell carcinoma of left kidney ([place]) 12/24/2017   Fluid overload, unspecified 07/06/2017   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   Other specified disorders of kidney and ureter 11/22/2016   Secondary hyperparathyroidism of renal origin ([place]) 10/23/2016   Other headache syndrome 08/21/2016   TIA (transient ischemic attack)    Essential hypertension    Seizures ([place])    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    Lung nodule    Diverticulitis of cecum 07/21/2015   Diverticulitis of large intestine without perforation or abscess without bleeding    Right lower quadrant pain    Obstructive sleep apnea 02/08/2015   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   Diverticulosis 07/13/2014   Lower GI bleed 07/12/2014   Refusal of blood transfusions as patient is Jehovah's Witness    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   HLD (hyperlipidemia)    PRES (posterior reversible encephalopathy syndrome)    History of lacunar cerebrovascular accident (CVA) 05/23/2014   Anemia of renal disease 05/23/2014   GERD (gastroesophageal reflux disease) 12/09/2012   Gastrointestinal bleeding 11/12/2012   Melena 11/12/2012  Other psychoactive substance dependence, uncomplicated ([place]) 46/96/2952   Iron deficiency anemia, unspecified 06/10/2012   Moderate protein-calorie malnutrition ([place]) 07/29/2009   Coagulation defect, unspecified ([place]) 08/29/2006   Type 2 diabetes mellitus with diabetic peripheral angiopathy without gangrene ([place]) 08/29/2006   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 stage renal disease ([place]) 08/02/2006   Past Medical History:   Diagnosis Date   Acute gastric ulcer with hemorrhage    Acute GI bleeding 07/31/2019   Acute pericarditis    Anemia    hx of   Anxiety    situational    Arthritis    on meds   Borderline diabetes    Cataract    bilateral sx   Chronic headaches    Depression    situational    Diverticulitis    ESRD (end stage renal disease) ([place])     On Renal Transplant List," [facility]; MWF" (10/23/2016)   GERD (gastroesophageal reflux disease)    with certain foods   GI bleed    Hypertension    diet controlled   Parathyroid abnormality ([facility])    ectopic parathyroid gland   Presence of arteriovenous fistula for hemodialysis, primary ([place])    RUE   Refusal of blood product    NO WHOLE BLOOD PROUCTS   Renal cell carcinoma ([facility])    s/p hand assisted laparoscopic bilateral nephrectomies 11/29/17, + RCC left   Secondary hyperparathyroidism ([place])    Seizures ([place])    one episode in past, due to" elevated Potassium" 08/02/20- "at least 4 years ago"   Sleep apnea    doesn't use CPAP anymore since weight loss   Stroke ([place])    no residual    Family History  Problem Relation Age of Onset   Diabetes Father    Stroke Father    Hypertension Father    Uterine cancer Mother    Lupus Sister    Stroke Sister    Hypertension Sister    Anuerysm Brother        brain   Colon cancer Neg Hx    Esophageal cancer Neg Hx    Stomach cancer Neg Hx    Pancreatic cancer Neg Hx    Liver disease Neg Hx    Colon polyps Neg Hx    Rectal cancer Neg Hx     Past Surgical History:  Procedure Laterality Date   AV FISTULA PLACEMENT Right    right arm   BIOPSY  08/01/2019   Procedure: BIOPSY;  Surgeon: Juanita Craver, MD;  Location: [facility] ENDOSCOPY;  Service: Endoscopy;;   BIOPSY  11/18/2020   Procedure: BIOPSY;  Surgeon: Daryel November, MD;  Location: [facility] ENDOSCOPY;  Service: Gastroenterology;;   CATARACT EXTRACTION W/ INTRAOCULAR LENS  IMPLANT, BILATERAL     COLON SURGERY     COLONOSCOPY N/A 08/04/2015    Procedure: COLONOSCOPY;  Surgeon: Jerene Bears, MD;  Location: [facility] ENDOSCOPY;  Service: Endoscopy;  Laterality: N/A;   COLONOSCOPY  2017   JMP@ [facility]-good prep-mass -recall 1 yr   COLONOSCOPY WITH PROPOFOL N/A 11/25/2020   Procedure: COLONOSCOPY WITH PROPOFOL;  Surgeon: Sharyn Creamer, MD;  Location: [place];  Service: Gastroenterology;  Laterality: N/A;   ESOPHAGOGASTRODUODENOSCOPY N/A 08/01/2019   Procedure: ESOPHAGOGASTRODUODENOSCOPY (EGD);  Surgeon: Juanita Craver, MD;  Location: [facility] ENDOSCOPY;  Service: Endoscopy;  Laterality: N/A;   ESOPHAGOGASTRODUODENOSCOPY N/A 11/18/2020   Procedure: ESOPHAGOGASTRODUODENOSCOPY (EGD);  Surgeon: Daryel November, MD;  Location: [place];  Service: Gastroenterology;  Laterality: N/A;   ESOPHAGOGASTRODUODENOSCOPY (EGD) WITH PROPOFOL N/A 08/04/2019   Procedure: ESOPHAGOGASTRODUODENOSCOPY (EGD) WITH PROPOFOL;  Surgeon: Yetta Flock, MD;  Location: [place];  Service: Gastroenterology;  Laterality: N/A;   graft left arm Left    for dialysis x 2. Removed   HOT HEMOSTASIS  11/18/2020   Procedure: HOT HEMOSTASIS (ARGON PLASMA COAGULATION/BICAP);  Surgeon: Daryel November, MD;  Location: [place];  Service: Gastroenterology;;   INSERTION OF DIALYSIS CATHETER     Rt chest   LAPAROSCOPIC RIGHT COLECTOMY N/A 08/05/2015   Procedure: LAPAROSCOPIC RIGHT COLECTOMY- ASCENDING;  Surgeon: Stark Klein, MD;  Location: [place];  Service: General;  Laterality: N/A;   MASS EXCISION Left 05/28/2019   Procedure: EXCISION SOFT TISSUE MASS LEFT SHOULDER;  Surgeon: Armandina Gemma, MD;  Location: [facility];  Service: General;  Laterality: Left;   NEPHRECTOMY Bilateral    PARATHYROIDECTOMY N/A 06/12/2016   Procedure: TOTAL PARATHYROIDECTOMY WITH AUTOTRANSPLANTATION TO LEFT FOREARM;  Surgeon: Armandina Gemma, MD;  Location: [place];  Service: General;  Laterality: N/A;   PARATHYROIDECTOMY N/A 10/23/2016   Procedure: PARATHYROIDECTOMY;  Surgeon: Armandina Gemma, MD;   Location: [place];  Service: General;  Laterality: N/A;   REVERSE SHOULDER ARTHROPLASTY Right 08/24/2020   Procedure: REVERSE SHOULDER ARTHROPLASTY;  Surgeon: Hiram Gash, MD;  Location: [place];  Service: Orthopedics;  Laterality: Right;   REVISON OF ARTERIOVENOUS FISTULA Right 07/16/2017   Procedure: REVISION OF ARTERIOVENOUS FISTULA  Right ARM;  Surgeon: Waynetta Sandy, MD;  Location: [place];  Service: Vascular;  Laterality: Right;   TOTAL HIP ARTHROPLASTY Left 11/14/2020   Procedure: LEFT TOTAL HIP ARTHROPLASTY ANTERIOR APPROACH;  Surgeon: Leandrew Koyanagi, MD;  Location: [place];  Service: Orthopedics;  Laterality: Left;   UPPER GASTROINTESTINAL ENDOSCOPY  2021   @ [facility]   Social History   Occupational History   Occupation: retired    Fish farm manager: SELF EMPLOYED  Tobacco Use   Smoking status: Former    Types: Cigarettes    Quit date: 06/19/1990    Years since quitting: 30.5   Smokeless tobacco: Never   Tobacco comments:    rarely cigar  Vaping Use   Vaping Use: Never used  Substance and Sexual Activity   Alcohol use: Yes    Alcohol/week: 1.0 standard drink    Types: 1 Cans of beer per week    Comment: occasional beer   Drug use: Not Currently    Types: Marijuana    Comment: Last use several yrs ago   Sexual activity: Not Currently

## 2020-12-27 NOTE — Telephone Encounter (Signed)
Sent in corrected meds

## 2020-12-28 DIAGNOSIS — E875 Hyperkalemia: Secondary | ICD-10-CM | POA: Diagnosis not present

## 2020-12-28 DIAGNOSIS — D631 Anemia in chronic kidney disease: Secondary | ICD-10-CM | POA: Diagnosis not present

## 2020-12-28 DIAGNOSIS — N186 End stage renal disease: Secondary | ICD-10-CM | POA: Diagnosis not present

## 2020-12-28 DIAGNOSIS — D509 Iron deficiency anemia, unspecified: Secondary | ICD-10-CM | POA: Diagnosis not present

## 2020-12-28 DIAGNOSIS — N2581 Secondary hyperparathyroidism of renal origin: Secondary | ICD-10-CM | POA: Diagnosis not present

## 2020-12-28 DIAGNOSIS — Z992 Dependence on renal dialysis: Secondary | ICD-10-CM | POA: Diagnosis not present

## 2020-12-30 ENCOUNTER — Telehealth: Payer: Self-pay | Admitting: *Deleted

## 2020-12-30 DIAGNOSIS — N186 End stage renal disease: Secondary | ICD-10-CM | POA: Diagnosis not present

## 2020-12-30 DIAGNOSIS — E875 Hyperkalemia: Secondary | ICD-10-CM | POA: Diagnosis not present

## 2020-12-30 DIAGNOSIS — D509 Iron deficiency anemia, unspecified: Secondary | ICD-10-CM | POA: Diagnosis not present

## 2020-12-30 DIAGNOSIS — D631 Anemia in chronic kidney disease: Secondary | ICD-10-CM | POA: Diagnosis not present

## 2020-12-30 DIAGNOSIS — Z992 Dependence on renal dialysis: Secondary | ICD-10-CM | POA: Diagnosis not present

## 2020-12-30 DIAGNOSIS — N2581 Secondary hyperparathyroidism of renal origin: Secondary | ICD-10-CM | POA: Diagnosis not present

## 2020-12-30 NOTE — Telephone Encounter (Signed)
Ortho bundle 30 day call and survey completed. ?

## 2020-12-31 DIAGNOSIS — M1612 Unilateral primary osteoarthritis, left hip: Secondary | ICD-10-CM | POA: Diagnosis not present

## 2021-01-02 DIAGNOSIS — N186 End stage renal disease: Secondary | ICD-10-CM | POA: Diagnosis not present

## 2021-01-02 DIAGNOSIS — Z992 Dependence on renal dialysis: Secondary | ICD-10-CM | POA: Diagnosis not present

## 2021-01-02 DIAGNOSIS — N2581 Secondary hyperparathyroidism of renal origin: Secondary | ICD-10-CM | POA: Diagnosis not present

## 2021-01-02 DIAGNOSIS — E875 Hyperkalemia: Secondary | ICD-10-CM | POA: Diagnosis not present

## 2021-01-02 DIAGNOSIS — D631 Anemia in chronic kidney disease: Secondary | ICD-10-CM | POA: Diagnosis not present

## 2021-01-02 DIAGNOSIS — D509 Iron deficiency anemia, unspecified: Secondary | ICD-10-CM | POA: Diagnosis not present

## 2021-01-04 DIAGNOSIS — Z992 Dependence on renal dialysis: Secondary | ICD-10-CM | POA: Diagnosis not present

## 2021-01-04 DIAGNOSIS — D509 Iron deficiency anemia, unspecified: Secondary | ICD-10-CM | POA: Diagnosis not present

## 2021-01-04 DIAGNOSIS — D631 Anemia in chronic kidney disease: Secondary | ICD-10-CM | POA: Diagnosis not present

## 2021-01-04 DIAGNOSIS — N186 End stage renal disease: Secondary | ICD-10-CM | POA: Diagnosis not present

## 2021-01-04 DIAGNOSIS — E875 Hyperkalemia: Secondary | ICD-10-CM | POA: Diagnosis not present

## 2021-01-04 DIAGNOSIS — N2581 Secondary hyperparathyroidism of renal origin: Secondary | ICD-10-CM | POA: Diagnosis not present

## 2021-01-07 DIAGNOSIS — N186 End stage renal disease: Secondary | ICD-10-CM | POA: Diagnosis not present

## 2021-01-07 DIAGNOSIS — Z992 Dependence on renal dialysis: Secondary | ICD-10-CM | POA: Diagnosis not present

## 2021-01-07 DIAGNOSIS — N2581 Secondary hyperparathyroidism of renal origin: Secondary | ICD-10-CM | POA: Diagnosis not present

## 2021-01-07 DIAGNOSIS — D509 Iron deficiency anemia, unspecified: Secondary | ICD-10-CM | POA: Diagnosis not present

## 2021-01-07 DIAGNOSIS — E875 Hyperkalemia: Secondary | ICD-10-CM | POA: Diagnosis not present

## 2021-01-07 DIAGNOSIS — D631 Anemia in chronic kidney disease: Secondary | ICD-10-CM | POA: Diagnosis not present

## 2021-01-09 DIAGNOSIS — D509 Iron deficiency anemia, unspecified: Secondary | ICD-10-CM | POA: Diagnosis not present

## 2021-01-09 DIAGNOSIS — D631 Anemia in chronic kidney disease: Secondary | ICD-10-CM | POA: Diagnosis not present

## 2021-01-09 DIAGNOSIS — N186 End stage renal disease: Secondary | ICD-10-CM | POA: Diagnosis not present

## 2021-01-09 DIAGNOSIS — N2581 Secondary hyperparathyroidism of renal origin: Secondary | ICD-10-CM | POA: Diagnosis not present

## 2021-01-09 DIAGNOSIS — Z992 Dependence on renal dialysis: Secondary | ICD-10-CM | POA: Diagnosis not present

## 2021-01-09 DIAGNOSIS — E875 Hyperkalemia: Secondary | ICD-10-CM | POA: Diagnosis not present

## 2021-01-11 DIAGNOSIS — N2581 Secondary hyperparathyroidism of renal origin: Secondary | ICD-10-CM | POA: Diagnosis not present

## 2021-01-11 DIAGNOSIS — D631 Anemia in chronic kidney disease: Secondary | ICD-10-CM | POA: Diagnosis not present

## 2021-01-11 DIAGNOSIS — I12 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 stage renal disease: Secondary | ICD-10-CM | POA: Diagnosis not present

## 2021-01-11 DIAGNOSIS — Z992 Dependence on renal dialysis: Secondary | ICD-10-CM | POA: Diagnosis not present

## 2021-01-11 DIAGNOSIS — E875 Hyperkalemia: Secondary | ICD-10-CM | POA: Diagnosis not present

## 2021-01-11 DIAGNOSIS — N186 End stage renal disease: Secondary | ICD-10-CM | POA: Diagnosis not present

## 2021-01-11 DIAGNOSIS — D509 Iron deficiency anemia, unspecified: Secondary | ICD-10-CM | POA: Diagnosis not present

## 2021-01-13 DIAGNOSIS — Z7682 Awaiting organ transplant status: Secondary | ICD-10-CM | POA: Diagnosis not present

## 2021-01-13 DIAGNOSIS — E875 Hyperkalemia: Secondary | ICD-10-CM | POA: Diagnosis not present

## 2021-01-13 DIAGNOSIS — D509 Iron deficiency anemia, unspecified: Secondary | ICD-10-CM | POA: Diagnosis not present

## 2021-01-13 DIAGNOSIS — Z992 Dependence on renal dialysis: Secondary | ICD-10-CM | POA: Diagnosis not present

## 2021-01-13 DIAGNOSIS — D689 Coagulation defect, unspecified: Secondary | ICD-10-CM | POA: Diagnosis not present

## 2021-01-13 DIAGNOSIS — D631 Anemia in chronic kidney disease: Secondary | ICD-10-CM | POA: Diagnosis not present

## 2021-01-13 DIAGNOSIS — N2581 Secondary hyperparathyroidism of renal origin: Secondary | ICD-10-CM | POA: Diagnosis not present

## 2021-01-13 DIAGNOSIS — N186 End stage renal disease: Secondary | ICD-10-CM | POA: Diagnosis not present

## 2021-01-16 DIAGNOSIS — D631 Anemia in chronic kidney disease: Secondary | ICD-10-CM | POA: Diagnosis not present

## 2021-01-16 DIAGNOSIS — Z7682 Awaiting organ transplant status: Secondary | ICD-10-CM | POA: Diagnosis not present

## 2021-01-16 DIAGNOSIS — D509 Iron deficiency anemia, unspecified: Secondary | ICD-10-CM | POA: Diagnosis not present

## 2021-01-16 DIAGNOSIS — N186 End stage renal disease: Secondary | ICD-10-CM | POA: Diagnosis not present

## 2021-01-16 DIAGNOSIS — D689 Coagulation defect, unspecified: Secondary | ICD-10-CM | POA: Diagnosis not present

## 2021-01-16 DIAGNOSIS — N2581 Secondary hyperparathyroidism of renal origin: Secondary | ICD-10-CM | POA: Diagnosis not present

## 2021-01-16 DIAGNOSIS — E875 Hyperkalemia: Secondary | ICD-10-CM | POA: Diagnosis not present

## 2021-01-16 DIAGNOSIS — Z992 Dependence on renal dialysis: Secondary | ICD-10-CM | POA: Diagnosis not present

## 2021-01-18 DIAGNOSIS — Z992 Dependence on renal dialysis: Secondary | ICD-10-CM | POA: Diagnosis not present

## 2021-01-18 DIAGNOSIS — N186 End stage renal disease: Secondary | ICD-10-CM | POA: Diagnosis not present

## 2021-01-18 DIAGNOSIS — N2581 Secondary hyperparathyroidism of renal origin: Secondary | ICD-10-CM | POA: Diagnosis not present

## 2021-01-18 DIAGNOSIS — D631 Anemia in chronic kidney disease: Secondary | ICD-10-CM | POA: Diagnosis not present

## 2021-01-18 DIAGNOSIS — D509 Iron deficiency anemia, unspecified: Secondary | ICD-10-CM | POA: Diagnosis not present

## 2021-01-18 DIAGNOSIS — E875 Hyperkalemia: Secondary | ICD-10-CM | POA: Diagnosis not present

## 2021-01-18 DIAGNOSIS — Z7682 Awaiting organ transplant status: Secondary | ICD-10-CM | POA: Diagnosis not present

## 2021-01-18 DIAGNOSIS — D689 Coagulation defect, unspecified: Secondary | ICD-10-CM | POA: Diagnosis not present

## 2021-01-20 DIAGNOSIS — D631 Anemia in chronic kidney disease: Secondary | ICD-10-CM | POA: Diagnosis not present

## 2021-01-20 DIAGNOSIS — Z992 Dependence on renal dialysis: Secondary | ICD-10-CM | POA: Diagnosis not present

## 2021-01-20 DIAGNOSIS — Z7682 Awaiting organ transplant status: Secondary | ICD-10-CM | POA: Diagnosis not present

## 2021-01-20 DIAGNOSIS — N2581 Secondary hyperparathyroidism of renal origin: Secondary | ICD-10-CM | POA: Diagnosis not present

## 2021-01-20 DIAGNOSIS — D509 Iron deficiency anemia, unspecified: Secondary | ICD-10-CM | POA: Diagnosis not present

## 2021-01-20 DIAGNOSIS — D689 Coagulation defect, unspecified: Secondary | ICD-10-CM | POA: Diagnosis not present

## 2021-01-20 DIAGNOSIS — E875 Hyperkalemia: Secondary | ICD-10-CM | POA: Diagnosis not present

## 2021-01-20 DIAGNOSIS — N186 End stage renal disease: Secondary | ICD-10-CM | POA: Diagnosis not present

## 2021-01-23 DIAGNOSIS — N2581 Secondary hyperparathyroidism of renal origin: Secondary | ICD-10-CM | POA: Diagnosis not present

## 2021-01-23 DIAGNOSIS — D689 Coagulation defect, unspecified: Secondary | ICD-10-CM | POA: Diagnosis not present

## 2021-01-23 DIAGNOSIS — Z992 Dependence on renal dialysis: Secondary | ICD-10-CM | POA: Diagnosis not present

## 2021-01-23 DIAGNOSIS — Z7682 Awaiting organ transplant status: Secondary | ICD-10-CM | POA: Diagnosis not present

## 2021-01-23 DIAGNOSIS — D509 Iron deficiency anemia, unspecified: Secondary | ICD-10-CM | POA: Diagnosis not present

## 2021-01-23 DIAGNOSIS — E875 Hyperkalemia: Secondary | ICD-10-CM | POA: Diagnosis not present

## 2021-01-23 DIAGNOSIS — D631 Anemia in chronic kidney disease: Secondary | ICD-10-CM | POA: Diagnosis not present

## 2021-01-23 DIAGNOSIS — N186 End stage renal disease: Secondary | ICD-10-CM | POA: Diagnosis not present

## 2021-01-25 DIAGNOSIS — D631 Anemia in chronic kidney disease: Secondary | ICD-10-CM | POA: Diagnosis not present

## 2021-01-25 DIAGNOSIS — Z7682 Awaiting organ transplant status: Secondary | ICD-10-CM | POA: Diagnosis not present

## 2021-01-25 DIAGNOSIS — N2581 Secondary hyperparathyroidism of renal origin: Secondary | ICD-10-CM | POA: Diagnosis not present

## 2021-01-25 DIAGNOSIS — Z992 Dependence on renal dialysis: Secondary | ICD-10-CM | POA: Diagnosis not present

## 2021-01-25 DIAGNOSIS — D509 Iron deficiency anemia, unspecified: Secondary | ICD-10-CM | POA: Diagnosis not present

## 2021-01-25 DIAGNOSIS — D689 Coagulation defect, unspecified: Secondary | ICD-10-CM | POA: Diagnosis not present

## 2021-01-25 DIAGNOSIS — N186 End stage renal disease: Secondary | ICD-10-CM | POA: Diagnosis not present

## 2021-01-25 DIAGNOSIS — E875 Hyperkalemia: Secondary | ICD-10-CM | POA: Diagnosis not present

## 2021-01-27 DIAGNOSIS — E875 Hyperkalemia: Secondary | ICD-10-CM | POA: Diagnosis not present

## 2021-01-27 DIAGNOSIS — Z7682 Awaiting organ transplant status: Secondary | ICD-10-CM | POA: Diagnosis not present

## 2021-01-27 DIAGNOSIS — D631 Anemia in chronic kidney disease: Secondary | ICD-10-CM | POA: Diagnosis not present

## 2021-01-27 DIAGNOSIS — D509 Iron deficiency anemia, unspecified: Secondary | ICD-10-CM | POA: Diagnosis not present

## 2021-01-27 DIAGNOSIS — N186 End stage renal disease: Secondary | ICD-10-CM | POA: Diagnosis not present

## 2021-01-27 DIAGNOSIS — N2581 Secondary hyperparathyroidism of renal origin: Secondary | ICD-10-CM | POA: Diagnosis not present

## 2021-01-27 DIAGNOSIS — Z992 Dependence on renal dialysis: Secondary | ICD-10-CM | POA: Diagnosis not present

## 2021-01-27 DIAGNOSIS — D689 Coagulation defect, unspecified: Secondary | ICD-10-CM | POA: Diagnosis not present

## 2021-01-30 DIAGNOSIS — D631 Anemia in chronic kidney disease: Secondary | ICD-10-CM | POA: Diagnosis not present

## 2021-01-30 DIAGNOSIS — E875 Hyperkalemia: Secondary | ICD-10-CM | POA: Diagnosis not present

## 2021-01-30 DIAGNOSIS — Z7682 Awaiting organ transplant status: Secondary | ICD-10-CM | POA: Diagnosis not present

## 2021-01-30 DIAGNOSIS — N186 End stage renal disease: Secondary | ICD-10-CM | POA: Diagnosis not present

## 2021-01-30 DIAGNOSIS — Z992 Dependence on renal dialysis: Secondary | ICD-10-CM | POA: Diagnosis not present

## 2021-01-30 DIAGNOSIS — N2581 Secondary hyperparathyroidism of renal origin: Secondary | ICD-10-CM | POA: Diagnosis not present

## 2021-01-30 DIAGNOSIS — D689 Coagulation defect, unspecified: Secondary | ICD-10-CM | POA: Diagnosis not present

## 2021-01-30 DIAGNOSIS — H524 Presbyopia: Secondary | ICD-10-CM | POA: Diagnosis not present

## 2021-01-30 DIAGNOSIS — D509 Iron deficiency anemia, unspecified: Secondary | ICD-10-CM | POA: Diagnosis not present

## 2021-01-30 DIAGNOSIS — M1612 Unilateral primary osteoarthritis, left hip: Secondary | ICD-10-CM | POA: Diagnosis not present

## 2021-02-01 DIAGNOSIS — D631 Anemia in chronic kidney disease: Secondary | ICD-10-CM | POA: Diagnosis not present

## 2021-02-01 DIAGNOSIS — E875 Hyperkalemia: Secondary | ICD-10-CM | POA: Diagnosis not present

## 2021-02-01 DIAGNOSIS — M25561 Pain in right knee: Secondary | ICD-10-CM | POA: Diagnosis not present

## 2021-02-01 DIAGNOSIS — Z7682 Awaiting organ transplant status: Secondary | ICD-10-CM | POA: Diagnosis not present

## 2021-02-01 DIAGNOSIS — D689 Coagulation defect, unspecified: Secondary | ICD-10-CM | POA: Diagnosis not present

## 2021-02-01 DIAGNOSIS — D509 Iron deficiency anemia, unspecified: Secondary | ICD-10-CM | POA: Diagnosis not present

## 2021-02-01 DIAGNOSIS — N186 End stage renal disease: Secondary | ICD-10-CM | POA: Diagnosis not present

## 2021-02-01 DIAGNOSIS — N2581 Secondary hyperparathyroidism of renal origin: Secondary | ICD-10-CM | POA: Diagnosis not present

## 2021-02-01 DIAGNOSIS — R29898 Other symptoms and signs involving the musculoskeletal system: Secondary | ICD-10-CM | POA: Diagnosis not present

## 2021-02-01 DIAGNOSIS — Z992 Dependence on renal dialysis: Secondary | ICD-10-CM | POA: Diagnosis not present

## 2021-02-02 ENCOUNTER — Ambulatory Visit: Payer: Medicare Other | Admitting: Orthopaedic Surgery

## 2021-02-03 ENCOUNTER — Other Ambulatory Visit: Payer: Self-pay

## 2021-02-03 ENCOUNTER — Ambulatory Visit (INDEPENDENT_AMBULATORY_CARE_PROVIDER_SITE_OTHER): Payer: Medicare Other | Admitting: Orthopaedic Surgery

## 2021-02-03 ENCOUNTER — Encounter: Payer: Self-pay | Admitting: Orthopaedic Surgery

## 2021-02-03 DIAGNOSIS — D689 Coagulation defect, unspecified: Secondary | ICD-10-CM | POA: Diagnosis not present

## 2021-02-03 DIAGNOSIS — E875 Hyperkalemia: Secondary | ICD-10-CM | POA: Diagnosis not present

## 2021-02-03 DIAGNOSIS — D509 Iron deficiency anemia, unspecified: Secondary | ICD-10-CM | POA: Diagnosis not present

## 2021-02-03 DIAGNOSIS — Z96642 Presence of left artificial hip joint: Secondary | ICD-10-CM

## 2021-02-03 DIAGNOSIS — Z992 Dependence on renal dialysis: Secondary | ICD-10-CM | POA: Diagnosis not present

## 2021-02-03 DIAGNOSIS — N186 End stage renal disease: Secondary | ICD-10-CM | POA: Diagnosis not present

## 2021-02-03 DIAGNOSIS — D631 Anemia in chronic kidney disease: Secondary | ICD-10-CM | POA: Diagnosis not present

## 2021-02-03 DIAGNOSIS — Z7682 Awaiting organ transplant status: Secondary | ICD-10-CM | POA: Diagnosis not present

## 2021-02-03 DIAGNOSIS — N2581 Secondary hyperparathyroidism of renal origin: Secondary | ICD-10-CM | POA: Diagnosis not present

## 2021-02-03 MED ORDER — AMOXICILLIN 500 MG PO CAPS: 2000.0000 mg | ORAL_CAPSULE | Freq: Once | ORAL | 6 refills | Status: AC

## 2021-02-03 NOTE — Progress Notes (Signed)
Post-Op Visit Note   Patient: Carl Bress Sr.           Date of Birth: 10-16-1960           MRN: [id number] Visit Date: 02/03/2021 PCP: [name], MD   Assessment & Plan:  Chief Complaint:  Chief Complaint  Patient presents with   Left Hip - Pain   Visit Diagnoses:  1. Status post total replacement of left hip     Plan: Octavia Bruckner is 12 weeks status post left total hip replacement.  He is overall doing well.  Only complaint is that there is still a little seroma around the hip area.  He is not really bothered by this.  Left hip surgical scar is healed.  There is a small seroma towards the lateral aspect of the thigh.  No evidence of infection.  He has movement of the hip without pain.  He is ambulating with a single-point cane without any pain.  He will use gentle heat to the seroma to help it resorb.  He can gradually increase activity as tolerated.  Dental prophylaxis was reinforced.  Amoxicillin sent in for his upcoming colonoscopy.  Recheck in 3 months.  Follow-Up Instructions: Return in about 3 months (around 05/04/2021).   Orders:  No orders of the defined types were placed in this encounter.  Meds ordered this encounter  Medications   amoxicillin (AMOXIL) 500 MG capsule    Sig: Take 4 capsules (2,000 mg total) by mouth once for 1 dose.    Dispense:  4 capsule    Refill:  6    Imaging: No results found.  PMFS History: Patient Active Problem List   Diagnosis Date Noted   Mucosal abnormality of esophagus    Mucosal abnormality of stomach    Mucosal abnormality of duodenum    Upper GI bleed    Left hip pain 11/16/2020   Type 2 diabetes mellitus with other specified complication ([place]) 55/73/2202   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/14/2020   Primary osteoarthritis of left hip 10/07/2020   Status post reverse total arthroplasty of right shoulder 08/24/2020   Diverticulitis 03/08/2020   ESRD on hemodialysis ([place]) 03/08/2020   Polyneuropathy in  diseases classified elsewhere ([place]) 03/08/2020   Pure hypercholesterolemia 03/08/2020   Sacral back pain 03/08/2020   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 11/10/2019   Acute gastric ulcer with hemorrhage    Hypotension of hemodialysis 07/31/2019   Diverticulitis large intestine 07/31/2019   Mass of soft tissue of shoulder 05/26/2019   Awaiting organ transplant status 03/18/2019   Finding of cocaine in blood 02/25/2019   Anaphylactic shock, unspecified, sequela 11/21/2018   Hypercalcemia 10/28/2018   Diarrhea, unspecified 08/08/2018   Hyperkalemia 03/12/2018   Patient is Jehovah's Witness 01/30/2018   Renal cell carcinoma of left kidney ([place]) 12/24/2017   Fluid overload, unspecified 07/06/2017   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   Other specified disorders of kidney and ureter 11/22/2016   Secondary hyperparathyroidism of renal origin ([place]) 10/23/2016   Other headache syndrome 08/21/2016   TIA (transient ischemic attack)    Essential hypertension    Seizures ([place])    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    Lung nodule    Diverticulitis of cecum 07/21/2015   Diverticulitis of large intestine without perforation or abscess without bleeding    Right lower quadrant pain    Obstructive sleep apnea 02/08/2015   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   Diverticulosis 07/13/2014  Lower GI bleed 07/12/2014   Refusal of blood transfusions as patient is Jehovah's Witness    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   HLD (hyperlipidemia)    PRES (posterior reversible encephalopathy syndrome)    History of lacunar cerebrovascular accident (CVA) 05/23/2014   Anemia of renal disease 05/23/2014   GERD (gastroesophageal reflux disease) 12/09/2012   Gastrointestinal bleeding 11/12/2012   Melena 11/12/2012   Other psychoactive substance dependence, uncomplicated ([place]) 16/11/9602   Iron deficiency anemia,  unspecified 06/10/2012   Moderate protein-calorie malnutrition ([place]) 07/29/2009   Coagulation defect, unspecified ([place]) 08/29/2006   Type 2 diabetes mellitus with diabetic peripheral angiopathy without gangrene ([place]) 08/29/2006   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 stage renal disease ([place]) 08/02/2006   Past Medical History:  Diagnosis Date   Acute gastric ulcer with hemorrhage    Acute GI bleeding 07/31/2019   Acute pericarditis    Anemia    hx of   Anxiety    situational    Arthritis    on meds   Borderline diabetes    Cataract    bilateral sx   Chronic headaches    Depression    situational    Diverticulitis    ESRD (end stage renal disease) ([place])     On Renal Transplant List," [facility]; MWF" (10/23/2016)   GERD (gastroesophageal reflux disease)    with certain foods   GI bleed    Hypertension    diet controlled   Parathyroid abnormality (HCC)    ectopic parathyroid gland   Presence of arteriovenous fistula for hemodialysis, primary ([place])    RUE   Refusal of blood product    NO WHOLE BLOOD PROUCTS   Renal cell carcinoma ([place])    s/p hand assisted laparoscopic bilateral nephrectomies 11/29/17, + RCC left   Secondary hyperparathyroidism ([place])    Seizures ([place])    one episode in past, due to" elevated Potassium" 08/02/20- "at least 4 years ago"   Sleep apnea    doesn't use CPAP anymore since weight loss   Stroke ([place])    no residual    Family History  Problem Relation Age of Onset   Diabetes Father    Stroke Father    Hypertension Father    Uterine cancer Mother    Lupus Sister    Stroke Sister    Hypertension Sister    Anuerysm Brother        brain   Colon cancer Neg Hx    Esophageal cancer Neg Hx    Stomach cancer Neg Hx    Pancreatic cancer Neg Hx    Liver disease Neg Hx    Colon polyps Neg Hx    Rectal cancer Neg Hx     Past Surgical History:  Procedure Laterality Date   AV FISTULA PLACEMENT Right     right arm   BIOPSY  08/01/2019   Procedure: BIOPSY;  Surgeon: Juanita Craver, MD;  Location: [facility] ENDOSCOPY;  Service: Endoscopy;;   BIOPSY  11/18/2020   Procedure: BIOPSY;  Surgeon: Daryel November, MD;  Location: [facility] ENDOSCOPY;  Service: Gastroenterology;;   CATARACT EXTRACTION W/ INTRAOCULAR LENS  IMPLANT, BILATERAL     COLON SURGERY     COLONOSCOPY N/A 08/04/2015   Procedure: COLONOSCOPY;  Surgeon: Jerene Bears, MD;  Location: [facility] ENDOSCOPY;  Service: Endoscopy;  Laterality: N/A;   COLONOSCOPY  2017   JMP@ [facility]-good prep-mass -  recall 1 yr   COLONOSCOPY WITH PROPOFOL N/A 11/25/2020   Procedure: COLONOSCOPY WITH PROPOFOL;  Surgeon: Sharyn Creamer, MD;  Location: [place];  Service: Gastroenterology;  Laterality: N/A;   ESOPHAGOGASTRODUODENOSCOPY N/A 08/01/2019   Procedure: ESOPHAGOGASTRODUODENOSCOPY (EGD);  Surgeon: Juanita Craver, MD;  Location: [facility] ENDOSCOPY;  Service: Endoscopy;  Laterality: N/A;   ESOPHAGOGASTRODUODENOSCOPY N/A 11/18/2020   Procedure: ESOPHAGOGASTRODUODENOSCOPY (EGD);  Surgeon: Daryel November, MD;  Location: [place];  Service: Gastroenterology;  Laterality: N/A;   ESOPHAGOGASTRODUODENOSCOPY (EGD) WITH PROPOFOL N/A 08/04/2019   Procedure: ESOPHAGOGASTRODUODENOSCOPY (EGD) WITH PROPOFOL;  Surgeon: Yetta Flock, MD;  Location: [place];  Service: Gastroenterology;  Laterality: N/A;   graft left arm Left    for dialysis x 2. Removed   HOT HEMOSTASIS  11/18/2020   Procedure: HOT HEMOSTASIS (ARGON PLASMA COAGULATION/BICAP);  Surgeon: Daryel November, MD;  Location: [place];  Service: Gastroenterology;;   INSERTION OF DIALYSIS CATHETER     Rt chest   LAPAROSCOPIC RIGHT COLECTOMY N/A 08/05/2015   Procedure: LAPAROSCOPIC RIGHT COLECTOMY- ASCENDING;  Surgeon: Stark Klein, MD;  Location: [place];  Service: General;  Laterality: N/A;   MASS EXCISION Left 05/28/2019   Procedure: EXCISION SOFT TISSUE MASS LEFT SHOULDER;  Surgeon: Armandina Gemma, MD;   Location: [facility];  Service: General;  Laterality: Left;   NEPHRECTOMY Bilateral    PARATHYROIDECTOMY N/A 06/12/2016   Procedure: TOTAL PARATHYROIDECTOMY WITH AUTOTRANSPLANTATION TO LEFT FOREARM;  Surgeon: Armandina Gemma, MD;  Location: [place];  Service: General;  Laterality: N/A;   PARATHYROIDECTOMY N/A 10/23/2016   Procedure: PARATHYROIDECTOMY;  Surgeon: Armandina Gemma, MD;  Location: [place];  Service: General;  Laterality: N/A;   REVERSE SHOULDER ARTHROPLASTY Right 08/24/2020   Procedure: REVERSE SHOULDER ARTHROPLASTY;  Surgeon: Hiram Gash, MD;  Location: [place];  Service: Orthopedics;  Laterality: Right;   REVISON OF ARTERIOVENOUS FISTULA Right 07/16/2017   Procedure: REVISION OF ARTERIOVENOUS FISTULA  Right ARM;  Surgeon: Waynetta Sandy, MD;  Location: [place];  Service: Vascular;  Laterality: Right;   TOTAL HIP ARTHROPLASTY Left 11/14/2020   Procedure: LEFT TOTAL HIP ARTHROPLASTY ANTERIOR APPROACH;  Surgeon: Leandrew Koyanagi, MD;  Location: [place];  Service: Orthopedics;  Laterality: Left;   UPPER GASTROINTESTINAL ENDOSCOPY  2021   @ [facility]   Social History   Occupational History   Occupation: retired    Fish farm manager: SELF EMPLOYED  Tobacco Use   Smoking status: Former    Types: Cigarettes    Quit date: 06/19/1990    Years since quitting: 30.6   Smokeless tobacco: Never   Tobacco comments:    rarely cigar  Vaping Use   Vaping Use: Never used  Substance and Sexual Activity   Alcohol use: Yes    Alcohol/week: 1.0 standard drink    Types: 1 Cans of beer per week    Comment: occasional beer   Drug use: Not Currently    Types: Marijuana    Comment: Last use several yrs ago   Sexual activity: Not Currently

## 2021-02-06 DIAGNOSIS — Z7682 Awaiting organ transplant status: Secondary | ICD-10-CM | POA: Diagnosis not present

## 2021-02-06 DIAGNOSIS — E875 Hyperkalemia: Secondary | ICD-10-CM | POA: Diagnosis not present

## 2021-02-06 DIAGNOSIS — Z992 Dependence on renal dialysis: Secondary | ICD-10-CM | POA: Diagnosis not present

## 2021-02-06 DIAGNOSIS — D509 Iron deficiency anemia, unspecified: Secondary | ICD-10-CM | POA: Diagnosis not present

## 2021-02-06 DIAGNOSIS — D631 Anemia in chronic kidney disease: Secondary | ICD-10-CM | POA: Diagnosis not present

## 2021-02-06 DIAGNOSIS — N186 End stage renal disease: Secondary | ICD-10-CM | POA: Diagnosis not present

## 2021-02-06 DIAGNOSIS — D689 Coagulation defect, unspecified: Secondary | ICD-10-CM | POA: Diagnosis not present

## 2021-02-06 DIAGNOSIS — N2581 Secondary hyperparathyroidism of renal origin: Secondary | ICD-10-CM | POA: Diagnosis not present

## 2021-02-07 ENCOUNTER — Ambulatory Visit: Payer: Medicare Other | Admitting: Orthopaedic Surgery

## 2021-02-08 DIAGNOSIS — E875 Hyperkalemia: Secondary | ICD-10-CM | POA: Diagnosis not present

## 2021-02-08 DIAGNOSIS — N186 End stage renal disease: Secondary | ICD-10-CM | POA: Diagnosis not present

## 2021-02-08 DIAGNOSIS — Z992 Dependence on renal dialysis: Secondary | ICD-10-CM | POA: Diagnosis not present

## 2021-02-08 DIAGNOSIS — N2581 Secondary hyperparathyroidism of renal origin: Secondary | ICD-10-CM | POA: Diagnosis not present

## 2021-02-08 DIAGNOSIS — D689 Coagulation defect, unspecified: Secondary | ICD-10-CM | POA: Diagnosis not present

## 2021-02-08 DIAGNOSIS — Z7682 Awaiting organ transplant status: Secondary | ICD-10-CM | POA: Diagnosis not present

## 2021-02-08 DIAGNOSIS — D509 Iron deficiency anemia, unspecified: Secondary | ICD-10-CM | POA: Diagnosis not present

## 2021-02-08 DIAGNOSIS — D631 Anemia in chronic kidney disease: Secondary | ICD-10-CM | POA: Diagnosis not present

## 2021-02-10 DIAGNOSIS — N2581 Secondary hyperparathyroidism of renal origin: Secondary | ICD-10-CM | POA: Diagnosis not present

## 2021-02-10 DIAGNOSIS — Z7682 Awaiting organ transplant status: Secondary | ICD-10-CM | POA: Diagnosis not present

## 2021-02-10 DIAGNOSIS — E875 Hyperkalemia: Secondary | ICD-10-CM | POA: Diagnosis not present

## 2021-02-10 DIAGNOSIS — N186 End stage renal disease: Secondary | ICD-10-CM | POA: Diagnosis not present

## 2021-02-10 DIAGNOSIS — D509 Iron deficiency anemia, unspecified: Secondary | ICD-10-CM | POA: Diagnosis not present

## 2021-02-10 DIAGNOSIS — D689 Coagulation defect, unspecified: Secondary | ICD-10-CM | POA: Diagnosis not present

## 2021-02-10 DIAGNOSIS — D631 Anemia in chronic kidney disease: Secondary | ICD-10-CM | POA: Diagnosis not present

## 2021-02-10 DIAGNOSIS — Z992 Dependence on renal dialysis: Secondary | ICD-10-CM | POA: Diagnosis not present

## 2021-02-11 DIAGNOSIS — Z992 Dependence on renal dialysis: Secondary | ICD-10-CM | POA: Diagnosis not present

## 2021-02-11 DIAGNOSIS — N186 End stage renal disease: Secondary | ICD-10-CM | POA: Diagnosis not present

## 2021-02-11 DIAGNOSIS — I12 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 stage renal disease: Secondary | ICD-10-CM | POA: Diagnosis not present

## 2021-02-13 DIAGNOSIS — N186 End stage renal disease: Secondary | ICD-10-CM | POA: Diagnosis not present

## 2021-02-13 DIAGNOSIS — E875 Hyperkalemia: Secondary | ICD-10-CM | POA: Diagnosis not present

## 2021-02-13 DIAGNOSIS — D631 Anemia in chronic kidney disease: Secondary | ICD-10-CM | POA: Diagnosis not present

## 2021-02-13 DIAGNOSIS — D689 Coagulation defect, unspecified: Secondary | ICD-10-CM | POA: Diagnosis not present

## 2021-02-13 DIAGNOSIS — Z7682 Awaiting organ transplant status: Secondary | ICD-10-CM | POA: Diagnosis not present

## 2021-02-13 DIAGNOSIS — E1129 Type 2 diabetes mellitus with other diabetic kidney complication: Secondary | ICD-10-CM | POA: Diagnosis not present

## 2021-02-13 DIAGNOSIS — D509 Iron deficiency anemia, unspecified: Secondary | ICD-10-CM | POA: Diagnosis not present

## 2021-02-13 DIAGNOSIS — Z992 Dependence on renal dialysis: Secondary | ICD-10-CM | POA: Diagnosis not present

## 2021-02-13 DIAGNOSIS — N2581 Secondary hyperparathyroidism of renal origin: Secondary | ICD-10-CM | POA: Diagnosis not present

## 2021-02-14 ENCOUNTER — Other Ambulatory Visit: Payer: Self-pay

## 2021-02-14 ENCOUNTER — Encounter: Payer: Self-pay | Admitting: Rehabilitative and Restorative Service Providers"

## 2021-02-14 ENCOUNTER — Ambulatory Visit: Payer: Medicare Other | Attending: Family Medicine | Admitting: Rehabilitative and Restorative Service Providers"

## 2021-02-14 DIAGNOSIS — M6281 Muscle weakness (generalized): Secondary | ICD-10-CM | POA: Insufficient documentation

## 2021-02-14 DIAGNOSIS — M25552 Pain in left hip: Secondary | ICD-10-CM | POA: Diagnosis not present

## 2021-02-14 DIAGNOSIS — M25652 Stiffness of left hip, not elsewhere classified: Secondary | ICD-10-CM | POA: Insufficient documentation

## 2021-02-14 DIAGNOSIS — R29898 Other symptoms and signs involving the musculoskeletal system: Secondary | ICD-10-CM | POA: Insufficient documentation

## 2021-02-14 DIAGNOSIS — R2689 Other abnormalities of gait and mobility: Secondary | ICD-10-CM | POA: Diagnosis not present

## 2021-02-14 DIAGNOSIS — M25611 Stiffness of right shoulder, not elsewhere classified: Secondary | ICD-10-CM | POA: Insufficient documentation

## 2021-02-14 DIAGNOSIS — G8929 Other chronic pain: Secondary | ICD-10-CM | POA: Diagnosis not present

## 2021-02-14 DIAGNOSIS — M25562 Pain in left knee: Secondary | ICD-10-CM | POA: Insufficient documentation

## 2021-02-14 DIAGNOSIS — M25561 Pain in right knee: Secondary | ICD-10-CM | POA: Diagnosis not present

## 2021-02-14 NOTE — Therapy (Signed)
[facility] [address] [place], [place], [address] Phone: [phone]   Fax:  [phone]  Physical Therapy Evaluation  Patient Details  Name: Carl Henes Sr. MRN: [id number] Date of Birth: [birth year] Referring Provider (PT): Dr [name]   Encounter Date: 02/14/2021   PT End of Session - 02/14/21 0927     Visit Number 1    Date for PT Re-Evaluation 04/07/21    Authorization Type U[employer] Medicare    PT Start Time 0845    PT Stop Time 0925    PT Time Calculation (min) 40 min    Activity Tolerance Patient tolerated treatment well    Behavior During Therapy [facility] for tasks assessed/performed             Past Medical History:  Diagnosis Date   Acute gastric ulcer with hemorrhage    Acute GI bleeding 07/31/2019   Acute pericarditis    Anemia    hx of   Anxiety    situational    Arthritis    on meds   Borderline diabetes    Cataract    bilateral sx   Chronic headaches    Depression    situational    Diverticulitis    ESRD (end stage renal disease) ([place])     On Renal Transplant List," [facility]; MWF" (10/23/2016)   GERD (gastroesophageal reflux disease)    with certain foods   GI bleed    Hypertension    diet controlled   Parathyroid abnormality (HCC)    ectopic parathyroid gland   Presence of arteriovenous fistula for hemodialysis, primary ([place])    RUE   Refusal of blood product    NO WHOLE BLOOD PROUCTS   Renal cell carcinoma ([place])    s/p hand assisted laparoscopic bilateral nephrectomies 11/29/17, + RCC left   Secondary hyperparathyroidism ([place])    Seizures ([place])    one episode in past, due to" elevated Potassium" 08/02/20- "at least 4 years ago"   Sleep apnea    doesn't use CPAP anymore since weight loss   Stroke [facility])    no residual    Past Surgical History:  Procedure Laterality Date   AV FISTULA PLACEMENT Right    right arm   BIOPSY  08/01/2019   Procedure: BIOPSY;  Surgeon: Juanita Craver, MD;  Location: [facility] ENDOSCOPY;  Service: Endoscopy;;   BIOPSY  11/18/2020   Procedure: BIOPSY;  Surgeon: Daryel November, MD;  Location: [facility] ENDOSCOPY;  Service: Gastroenterology;;   CATARACT EXTRACTION W/ INTRAOCULAR LENS  IMPLANT, BILATERAL     COLON SURGERY     COLONOSCOPY N/A 08/04/2015   Procedure: COLONOSCOPY;  Surgeon: Jerene Bears, MD;  Location: [facility] ENDOSCOPY;  Service: Endoscopy;  Laterality: N/A;   COLONOSCOPY  2017   JMP@ [facility]-good prep-mass -recall 1 yr   COLONOSCOPY WITH PROPOFOL N/A 11/25/2020   Procedure: COLONOSCOPY WITH PROPOFOL;  Surgeon: Sharyn Creamer, MD;  Location: [place];  Service: Gastroenterology;  Laterality: N/A;   ESOPHAGOGASTRODUODENOSCOPY N/A 08/01/2019   Procedure: ESOPHAGOGASTRODUODENOSCOPY (EGD);  Surgeon: Juanita Craver, MD;  Location: [facility] ENDOSCOPY;  Service: Endoscopy;  Laterality: N/A;   ESOPHAGOGASTRODUODENOSCOPY N/A 11/18/2020   Procedure: ESOPHAGOGASTRODUODENOSCOPY (EGD);  Surgeon: Daryel November, MD;  Location: [place];  Service: Gastroenterology;  Laterality: N/A;   ESOPHAGOGASTRODUODENOSCOPY (EGD) WITH PROPOFOL N/A 08/04/2019   Procedure: ESOPHAGOGASTRODUODENOSCOPY (EGD) WITH PROPOFOL;  Surgeon: Yetta Flock, MD;  Location: [place];  Service: Gastroenterology;  Laterality: N/A;  graft left arm Left    for dialysis x 2. Removed   HOT HEMOSTASIS  11/18/2020   Procedure: HOT HEMOSTASIS (ARGON PLASMA COAGULATION/BICAP);  Surgeon: Daryel November, MD;  Location: [place];  Service: Gastroenterology;;   INSERTION OF DIALYSIS CATHETER     Rt chest   LAPAROSCOPIC RIGHT COLECTOMY N/A 08/05/2015   Procedure: LAPAROSCOPIC RIGHT COLECTOMY- ASCENDING;  Surgeon: Stark Klein, MD;  Location: [place];  Service: General;  Laterality: N/A;   MASS EXCISION Left 05/28/2019   Procedure: EXCISION SOFT TISSUE MASS LEFT SHOULDER;  Surgeon: Armandina Gemma, MD;  Location: [facility];  Service: General;  Laterality: Left;   NEPHRECTOMY  Bilateral    PARATHYROIDECTOMY N/A 06/12/2016   Procedure: TOTAL PARATHYROIDECTOMY WITH AUTOTRANSPLANTATION TO LEFT FOREARM;  Surgeon: Armandina Gemma, MD;  Location: [place];  Service: General;  Laterality: N/A;   PARATHYROIDECTOMY N/A 10/23/2016   Procedure: PARATHYROIDECTOMY;  Surgeon: Armandina Gemma, MD;  Location: [place];  Service: General;  Laterality: N/A;   REVERSE SHOULDER ARTHROPLASTY Right 08/24/2020   Procedure: REVERSE SHOULDER ARTHROPLASTY;  Surgeon: Hiram Gash, MD;  Location: [place];  Service: Orthopedics;  Laterality: Right;   REVISON OF ARTERIOVENOUS FISTULA Right 07/16/2017   Procedure: REVISION OF ARTERIOVENOUS FISTULA  Right ARM;  Surgeon: Waynetta Sandy, MD;  Location: [place];  Service: Vascular;  Laterality: Right;   TOTAL HIP ARTHROPLASTY Left 11/14/2020   Procedure: LEFT TOTAL HIP ARTHROPLASTY ANTERIOR APPROACH;  Surgeon: Leandrew Koyanagi, MD;  Location: [place];  Service: Orthopedics;  Laterality: Left;   UPPER GASTROINTESTINAL ENDOSCOPY  2021   @ [facility]    There were no vitals filed for this visit.    Subjective Assessment - 02/14/21 0852     Subjective Pt reports that his L THA was last year and the R shoulder surgery surgery was performed before that. Pt states that since that time he has been having some occasional R shoulder pain in the morning, but he is having most pain in his bilateral knees. Pt reports diffiuclty with getting out of his lower cars.    Pertinent History L THA in August 2022, R shoulder rotator cuff repair March 2022.    Limitations Standing;Walking    How long can you sit comfortably? 30 min    How long can you stand comfortably? 1 hour    How long can you walk comfortably? over a mile    Patient Stated Goals Less pain when doing activities and increased strength.    Currently in Pain? Yes    Pain Score 8     Pain Location Knee    Pain Orientation Right;Left    Pain Descriptors / Indicators Burning;Sharp    Pain Type Chronic pain    Pain  Onset More than a month ago    Pain Frequency Constant                [facility] PT Assessment - 02/14/21 0001       Assessment   Medical Diagnosis M25.561 (ICD-10-CM) - Pain in right knee  R29.898 (ICD-10-CM) - Other symptoms and signs involving the musculoskeletal system    Referring Provider (PT) Dr Lauretta Grill [place]    Hand Dominance Right    Prior Therapy PT following shoulder and hip surgeries      Precautions   Precautions None      Restrictions   Weight Bearing Restrictions No      Balance Screen   Has the patient fallen in the past  6 months No    Has the patient had a decrease in activity level because of a fear of falling?  No    Is the patient reluctant to leave their home because of a fear of falling?  No      Home Environment   Living Environment Private residence    Living Arrangements Other relatives   brother   Available Help at Discharge Family    Type of [place] to enter    Entrance Stairs-Number of Steps 2    [place] Two level;Able to live on main level with bedroom/bathroom   laundry down in the [place] - 4 wheels;Wheelchair - [employer] - single point      Prior Function   Level of [facility]    Vocation On disability    Vocation Requirements former [employer], shooting pool, riding bike, fishing      Cognition   Overall Cognitive Status Within Functional Limits for tasks assessed      Observation/Other Assessments   Focus on Therapeutic Outcomes (FOTO)  52%   63% predicted by visit 11     ROM / Strength   AROM / PROM / Strength Strength;AROM      AROM   AROM Assessment Site Shoulder    Right/Left Shoulder Right    Right Shoulder Flexion 90 Degrees    Right Shoulder ABduction 60 Degrees      Strength   Overall Strength Comments R shoulder strength of 3/5, B hips and knee strength of 4-/5      Flexibility   Soft Tissue Assessment /Muscle Length yes     Hamstrings B tightness      Transfers   Five time sit to stand comments  25.8 sec with BUE use required      Standardized Balance Assessment   Standardized Balance Assessment Timed Up and Go Test      Timed Up and Go Test   TUG Normal TUG    Normal TUG (seconds) 14.6                        Objective measurements completed on examination: See above findings.                PT Education - 02/14/21 0925     Education Details Pt provided with HEP    Person(s) Educated Patient    Methods Explanation;Demonstration;Handout    Comprehension Verbalized understanding;Returned demonstration              PT Short Term Goals - 02/14/21 1106       PT SHORT TERM GOAL #1   Title Pt will be independent with initial HEP.    Time 2    Period Weeks    Status New               PT Long Term Goals - 02/14/21 1107       PT LONG TERM GOAL #1   Title be independent in advanced HEP    Time 8    Period Weeks    Status New      PT LONG TERM GOAL #2   Title improve BLE strength to at least 4+/5 to negotiate steps without increased pain.    Time 8    Period Weeks    Status New      PT LONG TERM GOAL #3  Title Pt to increase R shoulder flexion and abduction to at least 115 degrees to allow him to reach into overhead cabinets    Time 8    Period Weeks    Status New      PT LONG TERM GOAL #4   Title Pt able to negotiate steps with reciprocal pattern with use of unilateral rail to  allow him to do laundry in his home.    Time 8    Period Weeks    Status New      PT LONG TERM GOAL #5   Title Increase FOTO to at least 63% to allow pt to perform activities with decreased complication.    Time 8    Period Weeks    Status New                    Plan - 02/14/21 0934     Clinical Impression Statement Pt is a 61 y.o. male referred to outpatient PT from Dr Lauretta Grill [place] secondary to knee pain and shoulder pain. 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  He enjoys being active and is currently living with his brother. Pt presents with R shoulder decreased ROM and weakness as well as B hip/knee weakness and increased time required for 5 times sit to/from stand and TUG. Pt reports having difficulty performing car transfers for his lower to the ground car secondary to LE weakness. Pt would benefit from skilled PT to address his functional impairments to allow him to return to desired hobbies without pain or weakness.    Personal Factors and Comorbidities Fitness;Comorbidity 3+    Comorbidities L THA, R shoulder surgery, ESRD on hemodialysis    Examination-Activity Limitations Locomotion Level;Reach Overhead;Squat;Stairs    Examination-Participation Restrictions Community Activity;Yard Work    Merchant navy officer Evolving/Moderate complexity    Clinical Decision Making Moderate    Rehab Potential Good    PT Frequency 2x / week    PT Duration 8 weeks    PT Treatment/Interventions ADLs/Self Care Home Management;Aquatic Therapy;Cryotherapy;Electrical Stimulation;Iontophoresis 4mg /ml Dexamethasone;Moist Heat;Gait training;Stair training;Functional mobility training;Therapeutic activities;Therapeutic exercise;Balance training;Neuromuscular re-education;Patient/family education;Manual techniques;Passive range of motion;Dry needling;Taping;Vasopneumatic Device;Joint Manipulations    PT Next Visit Plan assess and progress HEP, strengthening, shoulder ROM    PT Home Exercise Plan Access Code: [email or web address]    Consulted and Agree with Plan of Care Patient             Patient will benefit from skilled therapeutic intervention in order to improve the following deficits and impairments:  Decreased range of motion, Difficulty walking, Increased muscle spasms, Impaired UE functional use, Pain, Impaired flexibility, Decreased strength  Visit Diagnosis: Muscle weakness (generalized) - Plan: PT plan of care  cert/re-cert  Other abnormalities of gait and mobility - Plan: PT plan of care cert/re-cert  Stiffness of right shoulder, not elsewhere classified - Plan: PT plan of care cert/re-cert  Chronic pain of left knee - Plan: PT plan of care cert/re-cert  Chronic pain of right knee - Plan: PT plan of care cert/re-cert  Pain in left hip - Plan: PT plan of care cert/re-cert  Stiffness of left hip, not elsewhere classified - Plan: PT plan of care cert/re-cert     Problem List Patient Active Problem List   Diagnosis Date Noted   Mucosal abnormality of esophagus    Mucosal abnormality of stomach    Mucosal abnormality of duodenum    Upper GI bleed  Left hip pain 11/16/2020   Type 2 diabetes mellitus with other specified complication ([place]) 36/14/4315   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/14/2020   Primary osteoarthritis of left hip 10/07/2020   Status post reverse total arthroplasty of right shoulder 08/24/2020   Diverticulitis 03/08/2020   ESRD on hemodialysis ([place]) 03/08/2020   Polyneuropathy in diseases classified elsewhere ([place]) 03/08/2020   Pure hypercholesterolemia 03/08/2020   Sacral back pain 03/08/2020   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 11/10/2019   Acute gastric ulcer with hemorrhage    Hypotension of hemodialysis 07/31/2019   Diverticulitis large intestine 07/31/2019   Mass of soft tissue of shoulder 05/26/2019   Awaiting organ transplant status 03/18/2019   Finding of cocaine in blood 02/25/2019   Anaphylactic shock, unspecified, sequela 11/21/2018   Hypercalcemia 10/28/2018   Diarrhea, unspecified 08/08/2018   Hyperkalemia 03/12/2018   Patient is Jehovah's Witness 01/30/2018   Renal cell carcinoma of left kidney ([place]) 12/24/2017   Fluid overload, unspecified 07/06/2017   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   Other specified disorders of kidney and ureter 11/22/2016   Secondary hyperparathyroidism of renal origin ([place]) 10/23/2016   Other  headache syndrome 08/21/2016   TIA (transient ischemic attack)    Essential hypertension    Seizures ([place])    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    Lung nodule    Diverticulitis of cecum 07/21/2015   Diverticulitis of large intestine without perforation or abscess without bleeding    Right lower quadrant pain    Obstructive sleep apnea 02/08/2015   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   Diverticulosis 07/13/2014   Lower GI bleed 07/12/2014   Refusal of blood transfusions as patient is Jehovah's Witness    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   HLD (hyperlipidemia)    PRES (posterior reversible encephalopathy syndrome)    History of lacunar cerebrovascular accident (CVA) 05/23/2014   Anemia of renal disease 05/23/2014   GERD (gastroesophageal reflux disease) 12/09/2012   Gastrointestinal bleeding 11/12/2012   Melena 11/12/2012   Other psychoactive substance dependence, uncomplicated ([place]) 40/09/6759   Iron deficiency anemia, unspecified 06/10/2012   Moderate protein-calorie malnutrition ([place]) 07/29/2009   Coagulation defect, unspecified ([place]) 08/29/2006   Type 2 diabetes mellitus with diabetic peripheral angiopathy without gangrene ([place]) 08/29/2006   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 stage renal disease ([place]) 08/02/2006    Juel Burrow, PT, DPT 02/14/2021, 11:18 AM  [facility] [address] [place], [place], [address] Phone: [phone]   Fax:  [phone]  Name: Carl Verno Sr. MRN: [id number] Date of Birth: Nov 15, 1960

## 2021-02-14 NOTE — Patient Instructions (Signed)
Access Code: [email or web address] URL: [email or web address] Date: 02/14/2021 Prepared by: Juel Burrow  Exercises Seated Long Arc Quad - 1-2 x daily - 7 x weekly - 2 sets - 10 reps Seated March - 1-2 x daily - 7 x weekly - 2 sets - 10 reps Seated Hamstring Stretch - 1-2 x daily - 7 x weekly - 1 sets - 3 reps - 15 sec hold Shoulder Flexion Wall Slide with Towel - 1-2 x daily - 7 x weekly - 2 sets - 10 reps

## 2021-02-15 DIAGNOSIS — D631 Anemia in chronic kidney disease: Secondary | ICD-10-CM | POA: Diagnosis not present

## 2021-02-15 DIAGNOSIS — Z7682 Awaiting organ transplant status: Secondary | ICD-10-CM | POA: Diagnosis not present

## 2021-02-15 DIAGNOSIS — Z992 Dependence on renal dialysis: Secondary | ICD-10-CM | POA: Diagnosis not present

## 2021-02-15 DIAGNOSIS — N186 End stage renal disease: Secondary | ICD-10-CM | POA: Diagnosis not present

## 2021-02-15 DIAGNOSIS — N2581 Secondary hyperparathyroidism of renal origin: Secondary | ICD-10-CM | POA: Diagnosis not present

## 2021-02-15 DIAGNOSIS — D509 Iron deficiency anemia, unspecified: Secondary | ICD-10-CM | POA: Diagnosis not present

## 2021-02-15 DIAGNOSIS — E1129 Type 2 diabetes mellitus with other diabetic kidney complication: Secondary | ICD-10-CM | POA: Diagnosis not present

## 2021-02-15 DIAGNOSIS — E875 Hyperkalemia: Secondary | ICD-10-CM | POA: Diagnosis not present

## 2021-02-15 DIAGNOSIS — D689 Coagulation defect, unspecified: Secondary | ICD-10-CM | POA: Diagnosis not present

## 2021-02-17 ENCOUNTER — Ambulatory Visit: Payer: Medicare Other | Admitting: Physical Therapy

## 2021-02-17 ENCOUNTER — Other Ambulatory Visit: Payer: Self-pay

## 2021-02-17 DIAGNOSIS — R29898 Other symptoms and signs involving the musculoskeletal system: Secondary | ICD-10-CM | POA: Diagnosis not present

## 2021-02-17 DIAGNOSIS — M25562 Pain in left knee: Secondary | ICD-10-CM

## 2021-02-17 DIAGNOSIS — M25552 Pain in left hip: Secondary | ICD-10-CM | POA: Diagnosis not present

## 2021-02-17 DIAGNOSIS — R2689 Other abnormalities of gait and mobility: Secondary | ICD-10-CM

## 2021-02-17 DIAGNOSIS — M25561 Pain in right knee: Secondary | ICD-10-CM | POA: Diagnosis not present

## 2021-02-17 DIAGNOSIS — E1129 Type 2 diabetes mellitus with other diabetic kidney complication: Secondary | ICD-10-CM | POA: Diagnosis not present

## 2021-02-17 DIAGNOSIS — D689 Coagulation defect, unspecified: Secondary | ICD-10-CM | POA: Diagnosis not present

## 2021-02-17 DIAGNOSIS — D509 Iron deficiency anemia, unspecified: Secondary | ICD-10-CM | POA: Diagnosis not present

## 2021-02-17 DIAGNOSIS — N2581 Secondary hyperparathyroidism of renal origin: Secondary | ICD-10-CM | POA: Diagnosis not present

## 2021-02-17 DIAGNOSIS — Z992 Dependence on renal dialysis: Secondary | ICD-10-CM | POA: Diagnosis not present

## 2021-02-17 DIAGNOSIS — E875 Hyperkalemia: Secondary | ICD-10-CM | POA: Diagnosis not present

## 2021-02-17 DIAGNOSIS — G8929 Other chronic pain: Secondary | ICD-10-CM

## 2021-02-17 DIAGNOSIS — M6281 Muscle weakness (generalized): Secondary | ICD-10-CM | POA: Diagnosis not present

## 2021-02-17 DIAGNOSIS — M25652 Stiffness of left hip, not elsewhere classified: Secondary | ICD-10-CM | POA: Diagnosis not present

## 2021-02-17 DIAGNOSIS — Z7682 Awaiting organ transplant status: Secondary | ICD-10-CM | POA: Diagnosis not present

## 2021-02-17 DIAGNOSIS — M25611 Stiffness of right shoulder, not elsewhere classified: Secondary | ICD-10-CM | POA: Diagnosis not present

## 2021-02-17 DIAGNOSIS — D631 Anemia in chronic kidney disease: Secondary | ICD-10-CM | POA: Diagnosis not present

## 2021-02-17 DIAGNOSIS — N186 End stage renal disease: Secondary | ICD-10-CM | POA: Diagnosis not present

## 2021-02-17 NOTE — Therapy (Signed)
[facility] @ [place], [place], [address] Phone: [phone]   Fax:  [phone]  Physical Therapy Treatment  Patient Details  Name: Carl Armendariz Sr. MRN: [id number] Date of Birth: 10/14/1960 Referring Provider (PT): Dr [name]   Encounter Date: 02/17/2021   PT End of Session - 02/17/21 1152     Visit Number 2    Date for PT Re-Evaluation 04/07/21    Authorization Type [employer] Medicare    PT Start Time [phone]   late   PT Stop Time 1014    PT Time Calculation (min) 36 min    Activity Tolerance Patient tolerated treatment well             Past Medical History:  Diagnosis Date   Acute gastric ulcer with hemorrhage    Acute GI bleeding 07/31/2019   Acute pericarditis    Anemia    hx of   Anxiety    situational    Arthritis    on meds   Borderline diabetes    Cataract    bilateral sx   Chronic headaches    Depression    situational    Diverticulitis    ESRD (end stage renal disease) ([place])     On Renal Transplant List," [facility]; MWF" (10/23/2016)   GERD (gastroesophageal reflux disease)    with certain foods   GI bleed    Hypertension    diet controlled   Parathyroid abnormality ([place])    ectopic parathyroid gland   Presence of arteriovenous fistula for hemodialysis, primary ([place])    RUE   Refusal of blood product    NO WHOLE BLOOD PROUCTS   Renal cell carcinoma ([place])    s/p hand assisted laparoscopic bilateral nephrectomies 11/29/17, + RCC left   Secondary hyperparathyroidism ([place])    Seizures ([place])    one episode in past, due to" elevated Potassium" 08/02/20- "at least 4 years ago"   Sleep apnea    doesn't use CPAP anymore since weight loss   Stroke [facility])    no residual    Past Surgical History:  Procedure Laterality Date   AV FISTULA PLACEMENT Right    right arm   BIOPSY  08/01/2019   Procedure: BIOPSY;  Surgeon: Juanita Craver, MD;  Location: [facility] ENDOSCOPY;  Service: Endoscopy;;    BIOPSY  11/18/2020   Procedure: BIOPSY;  Surgeon: Daryel November, MD;  Location[facility] ENDOSCOPY;  Service: Gastroenterology;;   CATARACT EXTRACTION W/ INTRAOCULAR LENS  IMPLANT, BILATERAL     COLON SURGERY     COLONOSCOPY N/A 08/04/2015   Procedure: COLONOSCOPY;  Surgeon: Jerene Bears, MD;  Location: [facility] ENDOSCOPY;  Service: Endoscopy;  Laterality: N/A;   COLONOSCOPY  2017   JMP@ [facility]-good prep-mass -recall 1 yr   COLONOSCOPY WITH PROPOFOL N/A 11/25/2020   Procedure: COLONOSCOPY WITH PROPOFOL;  Surgeon: Sharyn Creamer, MD;  Location: [place];  Service: Gastroenterology;  Laterality: N/A;   ESOPHAGOGASTRODUODENOSCOPY N/A 08/01/2019   Procedure: ESOPHAGOGASTRODUODENOSCOPY (EGD);  Surgeon: Juanita Craver, MD;  Location: [facility] ENDOSCOPY;  Service: Endoscopy;  Laterality: N/A;   ESOPHAGOGASTRODUODENOSCOPY N/A 11/18/2020   Procedure: ESOPHAGOGASTRODUODENOSCOPY (EGD);  Surgeon: Daryel November, MD;  Location: [place];  Service: Gastroenterology;  Laterality: N/A;   ESOPHAGOGASTRODUODENOSCOPY (EGD) WITH PROPOFOL N/A 08/04/2019   Procedure: ESOPHAGOGASTRODUODENOSCOPY (EGD) WITH PROPOFOL;  Surgeon: Yetta Flock, MD;  Location: [place];  Service: Gastroenterology;  Laterality: N/A;   graft left arm Left  for dialysis x 2. Removed   HOT HEMOSTASIS  11/18/2020   Procedure: HOT HEMOSTASIS (ARGON PLASMA COAGULATION/BICAP);  Surgeon: Daryel November, MD;  Location: [place];  Service: Gastroenterology;;   INSERTION OF DIALYSIS CATHETER     Rt chest   LAPAROSCOPIC RIGHT COLECTOMY N/A 08/05/2015   Procedure: LAPAROSCOPIC RIGHT COLECTOMY- ASCENDING;  Surgeon: Stark Klein, MD;  Location: [place];  Service: General;  Laterality: N/A;   MASS EXCISION Left 05/28/2019   Procedure: EXCISION SOFT TISSUE MASS LEFT SHOULDER;  Surgeon: Armandina Gemma, MD;  Location: [facility];  Service: General;  Laterality: Left;   NEPHRECTOMY Bilateral    PARATHYROIDECTOMY N/A 06/12/2016   Procedure:  TOTAL PARATHYROIDECTOMY WITH AUTOTRANSPLANTATION TO LEFT FOREARM;  Surgeon: Armandina Gemma, MD;  Location: [place];  Service: General;  Laterality: N/A;   PARATHYROIDECTOMY N/A 10/23/2016   Procedure: PARATHYROIDECTOMY;  Surgeon: Armandina Gemma, MD;  Location: [place];  Service: General;  Laterality: N/A;   REVERSE SHOULDER ARTHROPLASTY Right 08/24/2020   Procedure: REVERSE SHOULDER ARTHROPLASTY;  Surgeon: Hiram Gash, MD;  Location: [place];  Service: Orthopedics;  Laterality: Right;   REVISON OF ARTERIOVENOUS FISTULA Right 07/16/2017   Procedure: REVISION OF ARTERIOVENOUS FISTULA  Right ARM;  Surgeon: Waynetta Sandy, MD;  Location: [place];  Service: Vascular;  Laterality: Right;   TOTAL HIP ARTHROPLASTY Left 11/14/2020   Procedure: LEFT TOTAL HIP ARTHROPLASTY ANTERIOR APPROACH;  Surgeon: Leandrew Koyanagi, MD;  Location: [place];  Service: Orthopedics;  Laterality: Left;   UPPER GASTROINTESTINAL ENDOSCOPY  2021   @ [facility]    There were no vitals filed for this visit.   Subjective Assessment - 02/17/21 0941     Subjective A little sore in my knees, especially right one.  My right shoulder is a little sore today.    Currently in Pain? Yes    Pain Score 2     Pain Location Knee    Pain Orientation Right;Left    Multiple Pain Sites Yes    Pain Score 2    Pain Location Shoulder    Pain Orientation Right                               [facility] Adult PT Treatment/Exercise - 02/17/21 0001       Knee/Hip Exercises: Stretches   Other Knee/Hip Stretches 2nd step hip flexor stretch with reach forward 7x each side      Knee/Hip Exercises: Aerobic   Nustep seat 15 L3 6 min      Knee/Hip Exercises: Machines for Strengthening   Cybex Leg Press seat 9 60# bil 25x; single leg 40# 12x each      Knee/Hip Exercises: Standing   Forward Step Up Right;Left;1 set;10 reps;Hand Hold: 2;Step Height: 6"      Shoulder Exercises: Pulleys   Other Pulley Exercises standing with pulleys hooked to  Matrix with 4# counter weight: 3x10 extension 3 ways      Shoulder Exercises: ROM/Strengthening   Other ROM/Strengthening Exercises UE Ranger on lat bar seat 30x forward and V    Other ROM/Strengthening Exercises wall push ups 10x                       PT Short Term Goals - 02/14/21 1106       PT SHORT TERM GOAL #1   Title Pt will be independent with initial HEP.    Time 2  Period Weeks    Status New               PT Long Term Goals - 02/14/21 1107       PT LONG TERM GOAL #1   Title be independent in advanced HEP    Time 8    Period Weeks    Status New      PT LONG TERM GOAL #2   Title improve BLE strength to at least 4+/5 to negotiate steps without increased pain.    Time 8    Period Weeks    Status New      PT LONG TERM GOAL #3   Title Pt to increase R shoulder flexion and abduction to at least 115 degrees to allow him to reach into overhead cabinets    Time 8    Period Weeks    Status New      PT LONG TERM GOAL #4   Title Pt able to negotiate steps with reciprocal pattern with use of unilateral rail to  allow him to do laundry in his home.    Time 8    Period Weeks    Status New      PT LONG TERM GOAL #5   Title Increase FOTO to at least 63% to allow pt to perform activities with decreased complication.    Time 8    Period Weeks    Status New                   Plan - 02/17/21 1153     Clinical Impression Statement The patient is able to initiate right shoulder ROM ex's and LE strengthening exercises without an increase in pain levels.  He notes his LE surgical side feels stronger on the leg press than non-surgical side.  Therapist monitoring for excessive fatigue (ESRD) but patient tolerates the exercise intensity well.    Comorbidities L THA, R shoulder surgery, ESRD on hemodialysis    Examination-Activity Limitations Locomotion Level;Reach Overhead;Squat;Stairs    Rehab Potential Good    PT Frequency 2x / week    PT  Duration 8 weeks    PT Treatment/Interventions ADLs/Self Care Home Management;Aquatic Therapy;Cryotherapy;Electrical Stimulation;Iontophoresis 4mg /ml Dexamethasone;Moist Heat;Gait training;Stair training;Functional mobility training;Therapeutic activities;Therapeutic exercise;Balance training;Neuromuscular re-education;Patient/family education;Manual techniques;Passive range of motion;Dry needling;Taping;Vasopneumatic Device;Joint Manipulations    PT Next Visit Plan progress HEP, strengthening of LEs, shoulder ROM    PT Home Exercise Plan Access Code: [email or web address]             Patient will benefit from skilled therapeutic intervention in order to improve the following deficits and impairments:  Decreased range of motion, Difficulty walking, Increased muscle spasms, Impaired UE functional use, Pain, Impaired flexibility, Decreased strength  Visit Diagnosis: Muscle weakness (generalized)  Other abnormalities of gait and mobility  Stiffness of right shoulder, not elsewhere classified  Chronic pain of left knee  Chronic pain of right knee     Problem List Patient Active Problem List   Diagnosis Date Noted   Mucosal abnormality of esophagus    Mucosal abnormality of stomach    Mucosal abnormality of duodenum    Upper GI bleed    Left hip pain 11/16/2020   Type 2 diabetes mellitus with other specified complication ([place]) 62/83/1517   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/14/2020   Primary osteoarthritis of left hip 10/07/2020   Status post reverse total arthroplasty of right shoulder 08/24/2020   Diverticulitis 03/08/2020   ESRD on hemodialysis ([place])  03/08/2020   Polyneuropathy in diseases classified elsewhere ([place]) 03/08/2020   Pure hypercholesterolemia 03/08/2020   Sacral back pain 03/08/2020   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 11/10/2019   Acute gastric ulcer with hemorrhage    Hypotension of hemodialysis 07/31/2019   Diverticulitis large intestine 07/31/2019   Mass of  soft tissue of shoulder 05/26/2019   Awaiting organ transplant status 03/18/2019   Finding of cocaine in blood 02/25/2019   Anaphylactic shock, unspecified, sequela 11/21/2018   Hypercalcemia 10/28/2018   Diarrhea, unspecified 08/08/2018   Hyperkalemia 03/12/2018   Patient is Jehovah's Witness 01/30/2018   Renal cell carcinoma of left kidney ([place]) 12/24/2017   Fluid overload, unspecified 07/06/2017   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   Other specified disorders of kidney and ureter 11/22/2016   Secondary hyperparathyroidism of renal origin ([place]) 10/23/2016   Other headache syndrome 08/21/2016   TIA (transient ischemic attack)    Essential hypertension    Seizures ([place])    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    Lung nodule    Diverticulitis of cecum 07/21/2015   Diverticulitis of large intestine without perforation or abscess without bleeding    Right lower quadrant pain    Obstructive sleep apnea 02/08/2015   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   Diverticulosis 07/13/2014   Lower GI bleed 07/12/2014   Refusal of blood transfusions as patient is Jehovah's Witness    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   HLD (hyperlipidemia)    PRES (posterior reversible encephalopathy syndrome)    History of lacunar cerebrovascular accident (CVA) 05/23/2014   Anemia of renal disease 05/23/2014   GERD (gastroesophageal reflux disease) 12/09/2012   Gastrointestinal bleeding 11/12/2012   Melena 11/12/2012   Other psychoactive substance dependence, uncomplicated ([place]) 86/76/1950   Iron deficiency anemia, unspecified 06/10/2012   Moderate protein-calorie malnutrition ([place]) 07/29/2009   Coagulation defect, unspecified ([place]) 08/29/2006   Type 2 diabetes mellitus with diabetic peripheral angiopathy without gangrene ([place]) 08/29/2006   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 MRN: [id number] Date of Birth: 1960/08/20

## 2021-02-18 ENCOUNTER — Other Ambulatory Visit: Payer: Self-pay | Admitting: Adult Health

## 2021-02-20 ENCOUNTER — Other Ambulatory Visit: Payer: Self-pay

## 2021-02-20 ENCOUNTER — Encounter: Payer: Self-pay | Admitting: Physical Therapy

## 2021-02-20 ENCOUNTER — Ambulatory Visit: Payer: Medicare Other | Admitting: Physical Therapy

## 2021-02-20 DIAGNOSIS — M6281 Muscle weakness (generalized): Secondary | ICD-10-CM | POA: Diagnosis not present

## 2021-02-20 DIAGNOSIS — M25561 Pain in right knee: Secondary | ICD-10-CM | POA: Diagnosis not present

## 2021-02-20 DIAGNOSIS — N186 End stage renal disease: Secondary | ICD-10-CM | POA: Diagnosis not present

## 2021-02-20 DIAGNOSIS — G8929 Other chronic pain: Secondary | ICD-10-CM

## 2021-02-20 DIAGNOSIS — R2689 Other abnormalities of gait and mobility: Secondary | ICD-10-CM | POA: Diagnosis not present

## 2021-02-20 DIAGNOSIS — D509 Iron deficiency anemia, unspecified: Secondary | ICD-10-CM | POA: Diagnosis not present

## 2021-02-20 DIAGNOSIS — N2581 Secondary hyperparathyroidism of renal origin: Secondary | ICD-10-CM | POA: Diagnosis not present

## 2021-02-20 DIAGNOSIS — M25611 Stiffness of right shoulder, not elsewhere classified: Secondary | ICD-10-CM | POA: Diagnosis not present

## 2021-02-20 DIAGNOSIS — M25552 Pain in left hip: Secondary | ICD-10-CM | POA: Diagnosis not present

## 2021-02-20 DIAGNOSIS — M25562 Pain in left knee: Secondary | ICD-10-CM | POA: Diagnosis not present

## 2021-02-20 DIAGNOSIS — E1129 Type 2 diabetes mellitus with other diabetic kidney complication: Secondary | ICD-10-CM | POA: Diagnosis not present

## 2021-02-20 DIAGNOSIS — Z7682 Awaiting organ transplant status: Secondary | ICD-10-CM | POA: Diagnosis not present

## 2021-02-20 DIAGNOSIS — R29898 Other symptoms and signs involving the musculoskeletal system: Secondary | ICD-10-CM | POA: Diagnosis not present

## 2021-02-20 DIAGNOSIS — D689 Coagulation defect, unspecified: Secondary | ICD-10-CM | POA: Diagnosis not present

## 2021-02-20 DIAGNOSIS — E875 Hyperkalemia: Secondary | ICD-10-CM | POA: Diagnosis not present

## 2021-02-20 DIAGNOSIS — Z992 Dependence on renal dialysis: Secondary | ICD-10-CM | POA: Diagnosis not present

## 2021-02-20 DIAGNOSIS — D631 Anemia in chronic kidney disease: Secondary | ICD-10-CM | POA: Diagnosis not present

## 2021-02-20 DIAGNOSIS — M25652 Stiffness of left hip, not elsewhere classified: Secondary | ICD-10-CM | POA: Diagnosis not present

## 2021-02-20 NOTE — Therapy (Signed)
[facility] @ [address] [place], [place], [address] Phone: [phone]   Fax:  [phone]  Physical Therapy Treatment  Patient Details  Name: Carl Mojica Sr. MRN: [id number] Date of Birth: Apr 28, 1960 Referring Provider (PT): Dr [name]   Encounter Date: 02/20/2021   PT End of Session - 02/20/21 1037     Visit Number 3    Date for PT Re-Evaluation 04/07/21    Authorization Type [employer] Medicare    PT Start Time 7893    PT Stop Time 1117    PT Time Calculation (min) 40 min    Activity Tolerance Patient tolerated treatment well    Behavior During Therapy WFL for tasks assessed/performed             Past Medical History:  Diagnosis Date   Acute gastric ulcer with hemorrhage    Acute GI bleeding 07/31/2019   Acute pericarditis    Anemia    hx of   Anxiety    situational    Arthritis    on meds   Borderline diabetes    Cataract    bilateral sx   Chronic headaches    Depression    situational    Diverticulitis    ESRD (end stage renal disease) ([place])     On Renal Transplant List," [facility]; MWF" (10/23/2016)   GERD (gastroesophageal reflux disease)    with certain foods   GI bleed    Hypertension    diet controlled   Parathyroid abnormality (HCC)    ectopic parathyroid gland   Presence of arteriovenous fistula for hemodialysis, primary ([place])    RUE   Refusal of blood product    NO WHOLE BLOOD PROUCTS   Renal cell carcinoma ([place])    s/p hand assisted laparoscopic bilateral nephrectomies 11/29/17, + RCC left   Secondary hyperparathyroidism ([place])    Seizures ([place])    one episode in past, due to" elevated Potassium" 08/02/20- "at least 4 years ago"   Sleep apnea    doesn't use CPAP anymore since weight loss   Stroke [facility])    no residual    Past Surgical History:  Procedure Laterality Date   AV FISTULA PLACEMENT Right    right arm   BIOPSY  08/01/2019   Procedure: BIOPSY;  Surgeon: Juanita Craver, MD;  Location: [facility] ENDOSCOPY;  Service: Endoscopy;;   BIOPSY  11/18/2020   Procedure: BIOPSY;  Surgeon: Daryel November, MD;  Location: [facility] ENDOSCOPY;  Service: Gastroenterology;;   CATARACT EXTRACTION W/ INTRAOCULAR LENS  IMPLANT, BILATERAL     COLON SURGERY     COLONOSCOPY N/A 08/04/2015   Procedure: COLONOSCOPY;  Surgeon: Jerene Bears, MD;  Location: [facility] ENDOSCOPY;  Service: Endoscopy;  Laterality: N/A;   COLONOSCOPY  2017   JMP@ [facility]-good prep-mass -recall 1 yr   COLONOSCOPY WITH PROPOFOL N/A 11/25/2020   Procedure: COLONOSCOPY WITH PROPOFOL;  Surgeon: Sharyn Creamer, MD;  Location: [place];  Service: Gastroenterology;  Laterality: N/A;   ESOPHAGOGASTRODUODENOSCOPY N/A 08/01/2019   Procedure: ESOPHAGOGASTRODUODENOSCOPY (EGD);  Surgeon: Juanita Craver, MD;  Location: [facility] ENDOSCOPY;  Service: Endoscopy;  Laterality: N/A;   ESOPHAGOGASTRODUODENOSCOPY N/A 11/18/2020   Procedure: ESOPHAGOGASTRODUODENOSCOPY (EGD);  Surgeon: Daryel November, MD;  Location: [place];  Service: Gastroenterology;  Laterality: N/A;   ESOPHAGOGASTRODUODENOSCOPY (EGD) WITH PROPOFOL N/A 08/04/2019   Procedure: ESOPHAGOGASTRODUODENOSCOPY (EGD) WITH PROPOFOL;  Surgeon: Yetta Flock, MD;  Location: [place];  Service: Gastroenterology;  Laterality: N/A;  graft left arm Left    for dialysis x 2. Removed   HOT HEMOSTASIS  11/18/2020   Procedure: HOT HEMOSTASIS (ARGON PLASMA COAGULATION/BICAP);  Surgeon: Daryel November, MD;  Location: [place];  Service: Gastroenterology;;   INSERTION OF DIALYSIS CATHETER     Rt chest   LAPAROSCOPIC RIGHT COLECTOMY N/A 08/05/2015   Procedure: LAPAROSCOPIC RIGHT COLECTOMY- ASCENDING;  Surgeon: Stark Klein, MD;  Location: [place];  Service: General;  Laterality: N/A;   MASS EXCISION Left 05/28/2019   Procedure: EXCISION SOFT TISSUE MASS LEFT SHOULDER;  Surgeon: Armandina Gemma, MD;  Location: [facility];  Service: General;  Laterality: Left;   NEPHRECTOMY  Bilateral    PARATHYROIDECTOMY N/A 06/12/2016   Procedure: TOTAL PARATHYROIDECTOMY WITH AUTOTRANSPLANTATION TO LEFT FOREARM;  Surgeon: Armandina Gemma, MD;  Location: [place];  Service: General;  Laterality: N/A;   PARATHYROIDECTOMY N/A 10/23/2016   Procedure: PARATHYROIDECTOMY;  Surgeon: Armandina Gemma, MD;  Location: [place];  Service: General;  Laterality: N/A;   REVERSE SHOULDER ARTHROPLASTY Right 08/24/2020   Procedure: REVERSE SHOULDER ARTHROPLASTY;  Surgeon: Hiram Gash, MD;  Location: [place];  Service: Orthopedics;  Laterality: Right;   REVISON OF ARTERIOVENOUS FISTULA Right 07/16/2017   Procedure: REVISION OF ARTERIOVENOUS FISTULA  Right ARM;  Surgeon: Waynetta Sandy, MD;  Location: [place];  Service: Vascular;  Laterality: Right;   TOTAL HIP ARTHROPLASTY Left 11/14/2020   Procedure: LEFT TOTAL HIP ARTHROPLASTY ANTERIOR APPROACH;  Surgeon: Leandrew Koyanagi, MD;  Location: [place];  Service: Orthopedics;  Laterality: Left;   UPPER GASTROINTESTINAL ENDOSCOPY  2021   @ [facility]    There were no vitals filed for this visit.   Subjective Assessment - 02/20/21 1050     Subjective Last session was  really good, I felt good after it was a good level of work.    Pertinent History L THA in August 2022, R shoulder rotator cuff repair March 2022.    Currently in Pain? No/denies                               [facility] Adult PT Treatment/Exercise - 02/20/21 0001       Knee/Hip Exercises: Stretches   Other Knee/Hip Stretches 2nd step hip flexor stretch with reach forward 10x each side      Knee/Hip Exercises: Aerobic   Nustep seat 15 L3 7 min PTA present to discuss current status      Knee/Hip Exercises: Machines for Strengthening   Cybex Leg Press Seat 9 BilLE 65# 20x, Unilateral 40# 15x each side      Knee/Hip Exercises: Standing   Forward Step Up Right;Left;1 set;10 reps;Hand Hold: 2;Step Height: 6"    Other Standing Knee Exercises Shoulder rows red band 15x      Shoulder  Exercises: Pulleys   Other Pulley Exercises standing with pulleys hooked to Matrix with 4# counter weight: 3x10 extension 3 ways      Shoulder Exercises: ROM/Strengthening   Ball on Wall Tried wall slides to work into some overhead motion. Pt could only complete about 6 in scaption: "my arm is tuckered out."    Other ROM/Strengthening Exercises UE Ranger on lat bar seat 30x forward and V    Other ROM/Strengthening Exercises wall push ups 10x2                       PT Short Term Goals - 02/20/21 1059  PT SHORT TERM GOAL #1   Title Pt will be independent with initial HEP.    Time 2    Period Weeks    Status Achieved               PT Long Term Goals - 02/14/21 1107       PT LONG TERM GOAL #1   Title be independent in advanced HEP    Time 8    Period Weeks    Status New      PT LONG TERM GOAL #2   Title improve BLE strength to at least 4+/5 to negotiate steps without increased pain.    Time 8    Period Weeks    Status New      PT LONG TERM GOAL #3   Title Pt to increase R shoulder flexion and abduction to at least 115 degrees to allow him to reach into overhead cabinets    Time 8    Period Weeks    Status New      PT LONG TERM GOAL #4   Title Pt able to negotiate steps with reciprocal pattern with use of unilateral rail to  allow him to do laundry in his home.    Time 8    Period Weeks    Status New      PT LONG TERM GOAL #5   Title Increase FOTO to at least 63% to allow pt to perform activities with decreased complication.    Time 8    Period Weeks    Status New                   Plan - 02/20/21 1039     Clinical Impression Statement Pt arrives with no complaints this AM. He enjoyed his last workout, felt the level of work "was real good." Pt reports doing his HEP daily but still has difficultly lifting his RT arm overhead. LE tolerated more work on leg press no problem, added some light rows for RT shoulder which he tolerated  well. Pt did not tolerate wall slides much as he was attempting to move over head more. This wasn't really painful, the shoulder just "tuckered" out and pt had to stop the exercise.    Personal Factors and Comorbidities Fitness;Comorbidity 3+    Comorbidities L THA, R shoulder surgery, ESRD on hemodialysis    Examination-Participation Restrictions Community Activity;Yard Work    [employer] Evolving/Moderate complexity    Rehab Potential Good    PT Frequency 2x / week    PT Duration 8 weeks    PT Treatment/Interventions ADLs/Self Care Home Management;Aquatic Therapy;Cryotherapy;Electrical Stimulation;Iontophoresis 4mg /ml Dexamethasone;Moist Heat;Gait training;Stair training;Functional mobility training;Therapeutic activities;Therapeutic exercise;Balance training;Neuromuscular re-education;Patient/family education;Manual techniques;Passive range of motion;Dry needling;Taping;Vasopneumatic Device;Joint Manipulations    PT Next Visit Plan progress HEP, strengthening of LEs, shoulder ROM    PT Home Exercise Plan Access Code: [email or web address]    Consulted and Agree with Plan of Care Patient             Patient will benefit from skilled therapeutic intervention in order to improve the following deficits and impairments:  Decreased range of motion, Difficulty walking, Increased muscle spasms, Impaired UE functional use, Pain, Impaired flexibility, Decreased strength  Visit Diagnosis: Muscle weakness (generalized)  Other abnormalities of gait and mobility  Stiffness of right shoulder, not elsewhere classified  Chronic pain of left knee  Chronic pain of right knee     Problem List Patient Active Problem List  Diagnosis Date Noted   Mucosal abnormality of esophagus    Mucosal abnormality of stomach    Mucosal abnormality of duodenum    Upper GI bleed    Left hip pain 11/16/2020   Type 2 diabetes mellitus with other specified complication ([place]) 18/84/1660   Status  post total replacement of left hip 11/14/2020   Primary osteoarthritis of left hip 10/07/2020   Status post reverse total arthroplasty of right shoulder 08/24/2020   Diverticulitis 03/08/2020   ESRD on hemodialysis ([place]) 03/08/2020   Polyneuropathy in diseases classified elsewhere ([place]) 03/08/2020   Pure hypercholesterolemia 03/08/2020   Sacral back pain 03/08/2020   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 11/10/2019   Acute gastric ulcer with hemorrhage    Hypotension of hemodialysis 07/31/2019   Diverticulitis large intestine 07/31/2019   Mass of soft tissue of shoulder 05/26/2019   Awaiting organ transplant status 03/18/2019   Finding of cocaine in blood 02/25/2019   Anaphylactic shock, unspecified, sequela 11/21/2018   Hypercalcemia 10/28/2018   Diarrhea, unspecified 08/08/2018   Hyperkalemia 03/12/2018   Patient is Jehovah's Witness 01/30/2018   Renal cell carcinoma of left kidney ([place]) 12/24/2017   Fluid overload, unspecified 07/06/2017   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   Other specified disorders of kidney and ureter 11/22/2016   Secondary hyperparathyroidism of renal origin ([place]) 10/23/2016   Other headache syndrome 08/21/2016   TIA (transient ischemic attack)    Essential hypertension    Seizures ([place])    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    Lung nodule    Diverticulitis of cecum 07/21/2015   Diverticulitis of large intestine without perforation or abscess without bleeding    Right lower quadrant pain    Obstructive sleep apnea 02/08/2015   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   Diverticulosis 07/13/2014   Lower GI bleed 07/12/2014   Refusal of blood transfusions as patient is Jehovah's Witness    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   HLD (hyperlipidemia)    PRES (posterior reversible encephalopathy syndrome)    History of lacunar cerebrovascular accident (CVA) 05/23/2014    Anemia of renal disease 05/23/2014   GERD (gastroesophageal reflux disease) 12/09/2012   Gastrointestinal bleeding 11/12/2012   Melena 11/12/2012   Other psychoactive substance dependence, uncomplicated ([place]) 63/02/6008   Iron deficiency anemia, unspecified 06/10/2012   Moderate protein-calorie malnutrition ([place]) 07/29/2009   Coagulation defect, unspecified ([place]) 08/29/2006   Type 2 diabetes mellitus with diabetic peripheral angiopathy without gangrene ([place]) 08/29/2006   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 stage renal disease ([place]) 08/02/2006    Tramane Gorum, PTA 02/20/2021, 11:26 AM  [facility] @ [address] [place], [place], [address] Phone: [phone]   Fax:  [phone]  Name: Carl Dubie Sr. MRN: [id number] Date of Birth: [birth year]

## 2021-02-22 DIAGNOSIS — Z992 Dependence on renal dialysis: Secondary | ICD-10-CM | POA: Diagnosis not present

## 2021-02-22 DIAGNOSIS — D631 Anemia in chronic kidney disease: Secondary | ICD-10-CM | POA: Diagnosis not present

## 2021-02-22 DIAGNOSIS — D689 Coagulation defect, unspecified: Secondary | ICD-10-CM | POA: Diagnosis not present

## 2021-02-22 DIAGNOSIS — N2581 Secondary hyperparathyroidism of renal origin: Secondary | ICD-10-CM | POA: Diagnosis not present

## 2021-02-22 DIAGNOSIS — D509 Iron deficiency anemia, unspecified: Secondary | ICD-10-CM | POA: Diagnosis not present

## 2021-02-22 DIAGNOSIS — Z7682 Awaiting organ transplant status: Secondary | ICD-10-CM | POA: Diagnosis not present

## 2021-02-22 DIAGNOSIS — E1129 Type 2 diabetes mellitus with other diabetic kidney complication: Secondary | ICD-10-CM | POA: Diagnosis not present

## 2021-02-22 DIAGNOSIS — E875 Hyperkalemia: Secondary | ICD-10-CM | POA: Diagnosis not present

## 2021-02-22 DIAGNOSIS — N186 End stage renal disease: Secondary | ICD-10-CM | POA: Diagnosis not present

## 2021-02-23 ENCOUNTER — Ambulatory Visit: Payer: Medicare Other | Admitting: Rehabilitative and Restorative Service Providers"

## 2021-02-23 ENCOUNTER — Other Ambulatory Visit: Payer: Self-pay

## 2021-02-23 ENCOUNTER — Telehealth: Payer: Self-pay | Admitting: *Deleted

## 2021-02-23 ENCOUNTER — Encounter: Payer: Self-pay | Admitting: Rehabilitative and Restorative Service Providers"

## 2021-02-23 DIAGNOSIS — M25652 Stiffness of left hip, not elsewhere classified: Secondary | ICD-10-CM | POA: Diagnosis not present

## 2021-02-23 DIAGNOSIS — M25561 Pain in right knee: Secondary | ICD-10-CM | POA: Diagnosis not present

## 2021-02-23 DIAGNOSIS — G8929 Other chronic pain: Secondary | ICD-10-CM | POA: Diagnosis not present

## 2021-02-23 DIAGNOSIS — R2689 Other abnormalities of gait and mobility: Secondary | ICD-10-CM

## 2021-02-23 DIAGNOSIS — M25552 Pain in left hip: Secondary | ICD-10-CM | POA: Diagnosis not present

## 2021-02-23 DIAGNOSIS — M25562 Pain in left knee: Secondary | ICD-10-CM | POA: Diagnosis not present

## 2021-02-23 DIAGNOSIS — M6281 Muscle weakness (generalized): Secondary | ICD-10-CM

## 2021-02-23 DIAGNOSIS — M25611 Stiffness of right shoulder, not elsewhere classified: Secondary | ICD-10-CM | POA: Diagnosis not present

## 2021-02-23 DIAGNOSIS — R29898 Other symptoms and signs involving the musculoskeletal system: Secondary | ICD-10-CM | POA: Diagnosis not present

## 2021-02-23 NOTE — Therapy (Signed)
[place] [facility] @ [address] [place], [place], [address] Phone: [phone]   Fax:  [phone]  Physical Therapy Treatment  Patient Details  Name: Delman Goshorn Sr. MRN: [id number] Date of Birth: 1960-05-13 Referring Provider (PT): Dr [name]   Encounter Date: 02/23/2021   PT End of Session - 02/23/21 0934     Visit Number 4    Date for PT Re-Evaluation 04/07/21    Authorization Type UHC Medicare    PT Start Time 0930    PT Stop Time 1010    PT Time Calculation (min) 40 min    Activity Tolerance Patient tolerated treatment well    Behavior During Therapy WFL for tasks assessed/performed             Past Medical History:  Diagnosis Date   Acute gastric ulcer with hemorrhage    Acute GI bleeding 07/31/2019   Acute pericarditis    Anemia    hx of   Anxiety    situational    Arthritis    on meds   Borderline diabetes    Cataract    bilateral sx   Chronic headaches    Depression    situational    Diverticulitis    ESRD (end stage renal disease) ([place])     On Renal Transplant List," [facility]; MWF" (10/23/2016)   GERD (gastroesophageal reflux disease)    with certain foods   GI bleed    Hypertension    diet controlled   Parathyroid abnormality (HCC)    ectopic parathyroid gland   Presence of arteriovenous fistula for hemodialysis, primary ([place])    RUE   Refusal of blood product    NO WHOLE BLOOD PROUCTS   Renal cell carcinoma ([place])    s/p hand assisted laparoscopic bilateral nephrectomies 11/29/17, + RCC left   Secondary hyperparathyroidism ([place])    Seizures ([place])    one episode in past, due to" elevated Potassium" 08/02/20- "at least 4 years ago"   Sleep apnea    doesn't use CPAP anymore since weight loss   Stroke [facility])    no residual    Past Surgical History:  Procedure Laterality Date   AV FISTULA PLACEMENT Right    right arm   BIOPSY  08/01/2019   Procedure: BIOPSY;  Surgeon: Juanita Craver, MD;  Location: [facility] ENDOSCOPY;  Service: Endoscopy;;   BIOPSY  11/18/2020   Procedure: BIOPSY;  Surgeon: Daryel November, MD;  Location: [facility] ENDOSCOPY;  Service: Gastroenterology;;   CATARACT EXTRACTION W/ INTRAOCULAR LENS  IMPLANT, BILATERAL     COLON SURGERY     COLONOSCOPY N/A 08/04/2015   Procedure: COLONOSCOPY;  Surgeon: Jerene Bears, MD;  Location: [facility] ENDOSCOPY;  Service: Endoscopy;  Laterality: N/A;   COLONOSCOPY  2017   JMP@ [facility]-good prep-mass -recall 1 yr   COLONOSCOPY WITH PROPOFOL N/A 11/25/2020   Procedure: COLONOSCOPY WITH PROPOFOL;  Surgeon: Sharyn Creamer, MD;  Location: [place];  Service: Gastroenterology;  Laterality: N/A;   ESOPHAGOGASTRODUODENOSCOPY N/A 08/01/2019   Procedure: ESOPHAGOGASTRODUODENOSCOPY (EGD);  Surgeon: Juanita Craver, MD;  Location: [facility] ENDOSCOPY;  Service: Endoscopy;  Laterality: N/A;   ESOPHAGOGASTRODUODENOSCOPY N/A 11/18/2020   Procedure: ESOPHAGOGASTRODUODENOSCOPY (EGD);  Surgeon: Daryel November, MD;  Location: [place];  Service: Gastroenterology;  Laterality: N/A;   ESOPHAGOGASTRODUODENOSCOPY (EGD) WITH PROPOFOL N/A 08/04/2019   Procedure: ESOPHAGOGASTRODUODENOSCOPY (EGD) WITH PROPOFOL;  Surgeon: Yetta Flock, MD;  Location: [place];  Service: Gastroenterology;  Laterality: N/A;  graft left arm Left    for dialysis x 2. Removed   HOT HEMOSTASIS  11/18/2020   Procedure: HOT HEMOSTASIS (ARGON PLASMA COAGULATION/BICAP);  Surgeon: Daryel November, MD;  Location: [place];  Service: Gastroenterology;;   INSERTION OF DIALYSIS CATHETER     Rt chest   LAPAROSCOPIC RIGHT COLECTOMY N/A 08/05/2015   Procedure: LAPAROSCOPIC RIGHT COLECTOMY- ASCENDING;  Surgeon: Stark Klein, MD;  Location: [place];  Service: General;  Laterality: N/A;   MASS EXCISION Left 05/28/2019   Procedure: EXCISION SOFT TISSUE MASS LEFT SHOULDER;  Surgeon: Armandina Gemma, MD;  [facility]: [facility];  Service: General;  Laterality: Left;   NEPHRECTOMY  Bilateral    PARATHYROIDECTOMY N/A 06/12/2016   Procedure: TOTAL PARATHYROIDECTOMY WITH AUTOTRANSPLANTATION TO LEFT FOREARM;  Surgeon: Armandina Gemma, MD;  Location: [place];  Service: General;  Laterality: N/A;   PARATHYROIDECTOMY N/A 10/23/2016   Procedure: PARATHYROIDECTOMY;  Surgeon: Armandina Gemma, MD;  Location: [place];  Service: General;  Laterality: N/A;   REVERSE SHOULDER ARTHROPLASTY Right 08/24/2020   Procedure: REVERSE SHOULDER ARTHROPLASTY;  Surgeon: Hiram Gash, MD;  Location: [place];  Service: Orthopedics;  Laterality: Right;   REVISON OF ARTERIOVENOUS FISTULA Right 07/16/2017   Procedure: REVISION OF ARTERIOVENOUS FISTULA  Right ARM;  Surgeon: Waynetta Sandy, MD;  Location: [place];  Service: Vascular;  Laterality: Right;   TOTAL HIP ARTHROPLASTY Left 11/14/2020   Procedure: LEFT TOTAL HIP ARTHROPLASTY ANTERIOR APPROACH;  Surgeon: Leandrew Koyanagi, MD;  Location: [place];  Service: Orthopedics;  Laterality: Left;   UPPER GASTROINTESTINAL ENDOSCOPY  2021   @ [facility]    There were no vitals filed for this visit.   Subjective Assessment - 02/23/21 0933     Subjective Pt reports that he had a flat tire this morning and had some difficulty with changing it.    Pertinent History L THA in August 2022, R shoulder rotator cuff repair March 2022.    Patient Stated Goals Less pain when doing activities and increased strength.    Currently in Pain? No/denies                               [facility] Adult PT Treatment/Exercise - 02/23/21 0001       Knee/Hip Exercises: Stretches   Other Knee/Hip Stretches 2nd step hip flexor stretch with reach forward 10x each side      Knee/Hip Exercises: Aerobic   Nustep seat 15 L3 7 min PT present to discuss current status      Knee/Hip Exercises: Machines for Strengthening   Cybex Leg Press Seat 9 BilLE 70# 20x, Unilateral 40# 15x each side      Knee/Hip Exercises: Standing   Forward Step Up Right;Left;1 set;10 reps;Hand Hold:  2;Step Height: 6"    Wall Squat 2 sets;10 reps      Shoulder Exercises: Pulleys   Other Pulley Exercises standing with pulleys hooked to Matrix with 4# counter weight: 3x10 extension 3 ways      Shoulder Exercises: ROM/Strengthening   Cybex Row 20 reps    Cybex Row Limitations 40#    Wall Wash 2x10 B    Ball on Wall flexion with 2# ball x10    Other ROM/Strengthening Exercises Tricep pressdown 40# 2x10    Other ROM/Strengthening Exercises wall push ups 10x2  PT Short Term Goals - 02/23/21 1014       PT SHORT TERM GOAL #1   Title Pt will be independent with initial HEP.    Status Achieved               PT Long Term Goals - 02/23/21 1015       PT LONG TERM GOAL #1   Title be independent in advanced HEP    Status On-going      PT LONG TERM GOAL #2   Title improve BLE strength to at least 4+/5 to negotiate steps without increased pain.    Status On-going      PT LONG TERM GOAL #3   Title Pt to increase R shoulder flexion and abduction to at least 115 degrees to allow him to reach into overhead cabinets    Status On-going      PT LONG TERM GOAL #4   Title Pt able to negotiate steps with reciprocal pattern with use of unilateral rail to  allow him to do laundry in his home.    Status On-going      PT LONG TERM GOAL #5   Title Increase FOTO to at least 63% to allow pt to perform activities with decreased complication.    Status On-going                   Plan - 02/23/21 1011     Clinical Impression Statement Mr Mahrt continues to tolerate skilled PT well and reports a positive response of "I can tell my arm is finally working". Pt continues to have difficulty with ball on wall, but had less difficulty than last session.  Pt with limited squat with wall squats secondary to weakness. Pt continues to require skilled PT to progress towards goal related activities.    Personal Factors and Comorbidities Fitness;Comorbidity 3+     Comorbidities L THA, R shoulder surgery, ESRD on hemodialysis    PT Treatment/Interventions ADLs/Self Care Home Management;Aquatic Therapy;Cryotherapy;Electrical Stimulation;Iontophoresis 4mg /ml Dexamethasone;Moist Heat;Gait training;Stair training;Functional mobility training;Therapeutic activities;Therapeutic exercise;Balance training;Neuromuscular re-education;Patient/family education;Manual techniques;Passive range of motion;Dry needling;Taping;Vasopneumatic Device;Joint Manipulations    PT Next Visit Plan progress HEP, strengthening of LEs, shoulder ROM    Consulted and Agree with Plan of Care Patient             Patient will benefit from skilled therapeutic intervention in order to improve the following deficits and impairments:  Decreased range of motion, Difficulty walking, Increased muscle spasms, Impaired UE functional use, Pain, Impaired flexibility, Decreased strength  Visit Diagnosis: Muscle weakness (generalized)  Other abnormalities of gait and mobility  Stiffness of right shoulder, not elsewhere classified  Chronic pain of left knee  Chronic pain of right knee  Pain in left hip  Stiffness of left hip, not elsewhere classified     Problem List Patient Active Problem List   Diagnosis Date Noted   Mucosal abnormality of esophagus    Mucosal abnormality of stomach    Mucosal abnormality of duodenum    Upper GI bleed    Left hip pain 11/16/2020   Type 2 diabetes mellitus with other specified complication ([place]) 32/95/1884   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/14/2020   Primary osteoarthritis of left hip 10/07/2020   Status post reverse total arthroplasty of right shoulder 08/24/2020   Diverticulitis 03/08/2020   ESRD on hemodialysis ([place]) 03/08/2020   Polyneuropathy in diseases classified elsewhere ([place]) 03/08/2020   Pure hypercholesterolemia 03/08/2020   Sacral back  pain 03/08/2020   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 11/10/2019   Acute gastric  ulcer with hemorrhage    Hypotension of hemodialysis 07/31/2019   Diverticulitis large intestine 07/31/2019   Mass of soft tissue of shoulder 05/26/2019   Awaiting organ transplant status 03/18/2019   Finding of cocaine in blood 02/25/2019   Anaphylactic shock, unspecified, sequela 11/21/2018   Hypercalcemia 10/28/2018   Diarrhea, unspecified 08/08/2018   Hyperkalemia 03/12/2018   Patient is Jehovah's Witness 01/30/2018   Renal cell carcinoma of left kidney ([place]) 12/24/2017   Fluid overload, unspecified 07/06/2017   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   Other specified disorders of kidney and ureter 11/22/2016   Secondary hyperparathyroidism of renal origin ([place]) 10/23/2016   Other headache syndrome 08/21/2016   TIA (transient ischemic attack)    Essential hypertension    Seizures ([place])    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    Lung nodule    Diverticulitis of cecum 07/21/2015   Diverticulitis of large intestine without perforation or abscess without bleeding    Right lower quadrant pain    Obstructive sleep apnea 02/08/2015   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   Diverticulosis 07/13/2014   Lower GI bleed 07/12/2014   Refusal of blood transfusions as patient is Jehovah's Witness    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   HLD (hyperlipidemia)    PRES (posterior reversible encephalopathy syndrome)    History of lacunar cerebrovascular accident (CVA) 05/23/2014   Anemia of renal disease 05/23/2014   GERD (gastroesophageal reflux disease) 12/09/2012   Gastrointestinal bleeding 11/12/2012   Melena 11/12/2012   Other psychoactive substance dependence, uncomplicated ([place]) 50/35/4656   Iron deficiency anemia, unspecified 06/10/2012   Moderate protein-calorie malnutrition ([place]) 07/29/2009   Coagulation defect, unspecified ([place]) 08/29/2006   Type 2 diabetes mellitus with diabetic peripheral  angiopathy without gangrene ([place]) 08/29/2006   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 stage renal disease ([place]) 08/02/2006    Juel Burrow, PT, DPT 02/23/2021, 10:16 AM  [facility] @ [address] [place], [place], [address] Phone: [phone]   Fax:  [phone]  Name: Merrill Villarruel Sr. MRN: [id number] Date of Birth: [birth year]

## 2021-02-23 NOTE — Telephone Encounter (Signed)
Ortho bundle 90 day call completed. 

## 2021-02-24 DIAGNOSIS — Z7682 Awaiting organ transplant status: Secondary | ICD-10-CM | POA: Diagnosis not present

## 2021-02-24 DIAGNOSIS — Z992 Dependence on renal dialysis: Secondary | ICD-10-CM | POA: Diagnosis not present

## 2021-02-24 DIAGNOSIS — D509 Iron deficiency anemia, unspecified: Secondary | ICD-10-CM | POA: Diagnosis not present

## 2021-02-24 DIAGNOSIS — N2581 Secondary hyperparathyroidism of renal origin: Secondary | ICD-10-CM | POA: Diagnosis not present

## 2021-02-24 DIAGNOSIS — D689 Coagulation defect, unspecified: Secondary | ICD-10-CM | POA: Diagnosis not present

## 2021-02-24 DIAGNOSIS — D631 Anemia in chronic kidney disease: Secondary | ICD-10-CM | POA: Diagnosis not present

## 2021-02-24 DIAGNOSIS — N186 End stage renal disease: Secondary | ICD-10-CM | POA: Diagnosis not present

## 2021-02-24 DIAGNOSIS — E875 Hyperkalemia: Secondary | ICD-10-CM | POA: Diagnosis not present

## 2021-02-24 DIAGNOSIS — E1129 Type 2 diabetes mellitus with other diabetic kidney complication: Secondary | ICD-10-CM | POA: Diagnosis not present

## 2021-02-27 ENCOUNTER — Ambulatory Visit: Payer: Medicare Other | Admitting: Physical Therapy

## 2021-02-27 ENCOUNTER — Encounter: Payer: Self-pay | Admitting: Physical Therapy

## 2021-02-27 ENCOUNTER — Other Ambulatory Visit: Payer: Self-pay

## 2021-02-27 DIAGNOSIS — M25611 Stiffness of right shoulder, not elsewhere classified: Secondary | ICD-10-CM | POA: Diagnosis not present

## 2021-02-27 DIAGNOSIS — R29898 Other symptoms and signs involving the musculoskeletal system: Secondary | ICD-10-CM | POA: Diagnosis not present

## 2021-02-27 DIAGNOSIS — N186 End stage renal disease: Secondary | ICD-10-CM | POA: Diagnosis not present

## 2021-02-27 DIAGNOSIS — M25562 Pain in left knee: Secondary | ICD-10-CM | POA: Diagnosis not present

## 2021-02-27 DIAGNOSIS — D509 Iron deficiency anemia, unspecified: Secondary | ICD-10-CM | POA: Diagnosis not present

## 2021-02-27 DIAGNOSIS — M6281 Muscle weakness (generalized): Secondary | ICD-10-CM

## 2021-02-27 DIAGNOSIS — N2581 Secondary hyperparathyroidism of renal origin: Secondary | ICD-10-CM | POA: Diagnosis not present

## 2021-02-27 DIAGNOSIS — D689 Coagulation defect, unspecified: Secondary | ICD-10-CM | POA: Diagnosis not present

## 2021-02-27 DIAGNOSIS — M25552 Pain in left hip: Secondary | ICD-10-CM | POA: Diagnosis not present

## 2021-02-27 DIAGNOSIS — M25561 Pain in right knee: Secondary | ICD-10-CM | POA: Diagnosis not present

## 2021-02-27 DIAGNOSIS — M25652 Stiffness of left hip, not elsewhere classified: Secondary | ICD-10-CM | POA: Diagnosis not present

## 2021-02-27 DIAGNOSIS — D631 Anemia in chronic kidney disease: Secondary | ICD-10-CM | POA: Diagnosis not present

## 2021-02-27 DIAGNOSIS — Z992 Dependence on renal dialysis: Secondary | ICD-10-CM | POA: Diagnosis not present

## 2021-02-27 DIAGNOSIS — G8929 Other chronic pain: Secondary | ICD-10-CM | POA: Diagnosis not present

## 2021-02-27 DIAGNOSIS — Z7682 Awaiting organ transplant status: Secondary | ICD-10-CM | POA: Diagnosis not present

## 2021-02-27 DIAGNOSIS — E875 Hyperkalemia: Secondary | ICD-10-CM | POA: Diagnosis not present

## 2021-02-27 DIAGNOSIS — R2689 Other abnormalities of gait and mobility: Secondary | ICD-10-CM

## 2021-02-27 DIAGNOSIS — E1129 Type 2 diabetes mellitus with other diabetic kidney complication: Secondary | ICD-10-CM | POA: Diagnosis not present

## 2021-02-27 NOTE — Therapy (Signed)
[facility] @ [address] [place], [place], [address] Phone: [phone]   Fax:  [phone]  Physical Therapy Treatment  Patient Details  Name: Carl Deer Sr. MRN: [id number] Date of Birth: [birth year] Referring Provider (PT): Dr [name]   Encounter Date: 02/27/2021   PT End of Session - 02/27/21 1036     Visit Number 5    Date for PT Re-Evaluation 04/07/21    Authorization Type UHC Medicare    PT Start Time 1035   Pt very late   PT Stop Time 1111    PT Time Calculation (min) 36 min    Activity Tolerance Patient tolerated treatment well    Behavior During Therapy WFL for tasks assessed/performed             Past Medical History:  Diagnosis Date   Acute gastric ulcer with hemorrhage    Acute GI bleeding 07/31/2019   Acute pericarditis    Anemia    hx of   Anxiety    situational    Arthritis    on meds   Borderline diabetes    Cataract    bilateral sx   Chronic headaches    Depression    situational    Diverticulitis    ESRD (end stage renal disease) ([place])     On Renal Transplant List," [facility]; MWF" (10/23/2016)   GERD (gastroesophageal reflux disease)    with certain foods   GI bleed    Hypertension    diet controlled   Parathyroid abnormality (HCC)    ectopic parathyroid gland   Presence of arteriovenous fistula for hemodialysis, primary ([place])    RUE   Refusal of blood product    NO WHOLE BLOOD PROUCTS   Renal cell carcinoma ([place])    s/p hand assisted laparoscopic bilateral nephrectomies 11/29/17, + RCC left   Secondary hyperparathyroidism ([place])    Seizures ([place])    one episode in past, due to" elevated Potassium" 08/02/20- "at least 4 years ago"   Sleep apnea    doesn't use CPAP anymore since weight loss   Stroke [facility])    no residual    Past Surgical History:  Procedure Laterality Date   AV FISTULA PLACEMENT Right    right arm   BIOPSY  08/01/2019   Procedure: BIOPSY;   Surgeon: Juanita Craver, MD;  Location: [facility] ENDOSCOPY;  Service: Endoscopy;;   BIOPSY  11/18/2020   Procedure: BIOPSY;  Surgeon: Daryel November, MD;  [facility] ENDOSCOPY;  Service: Gastroenterology;;   CATARACT EXTRACTION W/ INTRAOCULAR LENS  IMPLANT, BILATERAL     COLON SURGERY     COLONOSCOPY N/A 08/04/2015   Procedure: COLONOSCOPY;  Surgeon: Jerene Bears, MD;  Location: [facility] ENDOSCOPY;  Service: Endoscopy;  Laterality: N/A;   COLONOSCOPY  2017   JMP@ [facility]-good prep-mass -recall 1 yr   COLONOSCOPY WITH PROPOFOL N/A 11/25/2020   Procedure: COLONOSCOPY WITH PROPOFOL;  Surgeon: Sharyn Creamer, MD;  Location: [place];  Service: Gastroenterology;  Laterality: N/A;   ESOPHAGOGASTRODUODENOSCOPY N/A 08/01/2019   Procedure: ESOPHAGOGASTRODUODENOSCOPY (EGD);  Surgeon: Juanita Craver, MD;  Location: [facility] ENDOSCOPY;  Service: Endoscopy;  Laterality: N/A;   ESOPHAGOGASTRODUODENOSCOPY N/A 11/18/2020   Procedure: ESOPHAGOGASTRODUODENOSCOPY (EGD);  Surgeon: Daryel November, MD;  Location: [place];  Service: Gastroenterology;  Laterality: N/A;   ESOPHAGOGASTRODUODENOSCOPY (EGD) WITH PROPOFOL N/A 08/04/2019   Procedure: ESOPHAGOGASTRODUODENOSCOPY (EGD) WITH PROPOFOL;  Surgeon: Yetta Flock, MD;  Location: [place];  Service: Gastroenterology;  Laterality: N/A;   graft left arm Left    for dialysis x 2. Removed   HOT HEMOSTASIS  11/18/2020   Procedure: HOT HEMOSTASIS (ARGON PLASMA COAGULATION/BICAP);  Surgeon: Daryel November, MD;  Location: [place];  Service: Gastroenterology;;   INSERTION OF DIALYSIS CATHETER     Rt chest   LAPAROSCOPIC RIGHT COLECTOMY N/A 08/05/2015   Procedure: LAPAROSCOPIC RIGHT COLECTOMY- ASCENDING;  Surgeon: Stark Klein, MD;  Location: [place];  Service: General;  Laterality: N/A;   MASS EXCISION Left 05/28/2019   Procedure: EXCISION SOFT TISSUE MASS LEFT SHOULDER;  Surgeon: Armandina Gemma, MD;  [facility];  Service: General;  Laterality: Left;    NEPHRECTOMY Bilateral    PARATHYROIDECTOMY N/A 06/12/2016   Procedure: TOTAL PARATHYROIDECTOMY WITH AUTOTRANSPLANTATION TO LEFT FOREARM;  Surgeon: Armandina Gemma, MD;  Location: [place];  Service: General;  Laterality: N/A;   PARATHYROIDECTOMY N/A 10/23/2016   Procedure: PARATHYROIDECTOMY;  Surgeon: Armandina Gemma, MD;  Location: [place];  Service: General;  Laterality: N/A;   REVERSE SHOULDER ARTHROPLASTY Right 08/24/2020   Procedure: REVERSE SHOULDER ARTHROPLASTY;  Surgeon: Hiram Gash, MD;  Location: [place];  Service: Orthopedics;  Laterality: Right;   REVISON OF ARTERIOVENOUS FISTULA Right 07/16/2017   Procedure: REVISION OF ARTERIOVENOUS FISTULA  Right ARM;  Surgeon: Waynetta Sandy, MD;  Location: [place];  Service: Vascular;  Laterality: Right;   TOTAL HIP ARTHROPLASTY Left 11/14/2020   Procedure: LEFT TOTAL HIP ARTHROPLASTY ANTERIOR APPROACH;  Surgeon: Leandrew Koyanagi, MD;  Location: [place];  Service: Orthopedics;  Laterality: Left;   UPPER GASTROINTESTINAL ENDOSCOPY  2021   @ [facility]    There were no vitals filed for this visit.   Subjective Assessment - 02/27/21 1038     Subjective Pt late for appt (almost 25 min) reports he ran into some complications. He reports "hanging in there." Has some "fluid" in my chest.    Pertinent History L THA in August 2022, R shoulder rotator cuff repair March 2022.    Currently in Pain? --   Shoulder and knee are achey this AM. Pt describes it as "usual."               [facility] PT Assessment - 02/27/21 0001       AROM   Right Shoulder Flexion 80 Degrees                           [facility] Adult PT Treatment/Exercise - 02/27/21 0001       Knee/Hip Exercises: Stretches   Other Knee/Hip Stretches 2nd step hip flexor stretch with reach forward 10x each side      Knee/Hip Exercises: Aerobic   Nustep seat 15 L3 8 min PTA present to discuss current status      Knee/Hip Exercises: Machines for Strengthening   Cybex Leg Press Seat 9  BilLE 75# 20x, Unilateral 40# 15x each side      Knee/Hip Exercises: Standing   Lateral Step Up Both;1 set;10 reps;Hand Hold: 1;Step Height: 6"    Forward Step Up Right;Left;1 set;10 reps;Hand Hold: 2;Step Height: 6"    Other Standing Knee Exercises Seated o n2 blue mats: sit to stand with 5# KB 2x10   VC to control descent     Shoulder Exercises: ROM/Strengthening   Cybex Row Limitations Blue band rows 15x    Other ROM/Strengthening Exercises Tricep pressdown 30# x10   40# was too heavy   Other ROM/Strengthening Exercises  Supine cane pushups 10x2, then overhead flexion stretch 10x2                       PT Short Term Goals - 02/23/21 1014       PT SHORT TERM GOAL #1   Title Pt will be independent with initial HEP.    Status Achieved               PT Long Term Goals - 02/27/21 1105       PT LONG TERM GOAL #3   Title Pt to increase R shoulder flexion and abduction to at least 115 degrees to allow him to reach into overhead cabinets    Time 8    Period Weeks    Status On-going                   Plan - 02/27/21 1044     Clinical Impression Statement Pt arrives almost 25 min late for aptt today. Pt reports today he is just "trying to hang in there." During session he reveals he had just come from dialysis and that "wore him out." Active scaption wa smeasured at 80 degrees today. Pt was successful in semi-reclined shoulder AAROM, less substitutions than in standing.    Personal Factors and Comorbidities Fitness;Comorbidity 3+    Comorbidities L THA, R shoulder surgery, ESRD on hemodialysis    Examination-Activity Limitations Locomotion Level;Reach Overhead;Squat;Stairs    Examination-Participation Restrictions Community Activity;Yard Work    [employer]chant [employer] officer Evolving/Moderate complexity    Rehab Potential Good    PT Frequency 2x / week    PT Duration 8 weeks    PT Treatment/Interventions ADLs/Self Care Home Management;Aquatic  Therapy;Cryotherapy;Electrical Stimulation;Iontophoresis 4mg /ml Dexamethasone;Moist Heat;Gait training;Stair training;Functional mobility training;Therapeutic activities;Therapeutic exercise;Balance training;Neuromuscular re-education;Patient/family education;Manual techniques;Passive range of motion;Dry needling;Taping;Vasopneumatic Device;Joint Manipulations    PT Next Visit Plan progress HEP, strengthening of LEs, shoulder ROM    PT Home Exercise Plan Access Code: [email or web address]    Consulted and Agree with Plan of Care Patient             Patient will benefit from skilled therapeutic intervention in order to improve the following deficits and impairments:  Decreased range of motion, Difficulty walking, Increased muscle spasms, Impaired UE functional use, Pain, Impaired flexibility, Decreased strength  Visit Diagnosis: Muscle weakness (generalized)  Other abnormalities of gait and mobility  Stiffness of right shoulder, not elsewhere classified     Problem List Patient Active Problem List   Diagnosis Date Noted   Mucosal abnormality of esophagus    Mucosal abnormality of stomach    Mucosal abnormality of duodenum    Upper GI bleed    Left hip pain 11/16/2020   Type 2 diabetes mellitus with other specified complication ([place]) 27/78/2423   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/14/2020   Primary osteoarthritis of left hip 10/07/2020   Status post reverse total arthroplasty of right shoulder 08/24/2020   Diverticulitis 03/08/2020   ESRD on hemodialysis ([place]) 03/08/2020   Polyneuropathy in diseases classified elsewhere ([place]) 03/08/2020   Pure hypercholesterolemia 03/08/2020   Sacral back pain 03/08/2020   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 11/10/2019   Acute gastric ulcer with hemorrhage    Hypotension of hemodialysis 07/31/2019   Diverticulitis large intestine 07/31/2019   Mass of soft tissue of shoulder 05/26/2019   Awaiting organ transplant status 03/18/2019   Finding of  cocaine in blood 02/25/2019   Anaphylactic shock, unspecified, sequela 11/21/2018  Hypercalcemia 10/28/2018   Diarrhea, unspecified 08/08/2018   Hyperkalemia 03/12/2018   Patient is Jehovah's Witness 01/30/2018   Renal cell carcinoma of left kidney ([place]) 12/24/2017   Fluid overload, unspecified 07/06/2017   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   Other specified disorders of kidney and ureter 11/22/2016   Secondary hyperparathyroidism of renal origin ([place]) 10/23/2016   Other headache syndrome 08/21/2016   TIA (transient ischemic attack)    Essential hypertension    Seizures ([place])    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    Lung nodule    Diverticulitis of cecum 07/21/2015   Diverticulitis of large intestine without perforation or abscess without bleeding    Right lower quadrant pain    Obstructive sleep apnea 02/08/2015   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   Diverticulosis 07/13/2014   Lower GI bleed 07/12/2014   Refusal of blood transfusions as patient is Jehovah's Witness    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   HLD (hyperlipidemia)    PRES (posterior reversible encephalopathy syndrome)    History of lacunar cerebrovascular accident (CVA) 05/23/2014   Anemia of renal disease 05/23/2014   GERD (gastroesophageal reflux disease) 12/09/2012   Gastrointestinal bleeding 11/12/2012   Melena 11/12/2012   Other psychoactive substance dependence, uncomplicated ([place]) 02/20/3233   Iron deficiency anemia, unspecified 06/10/2012   Moderate protein-calorie malnutrition ([place]) 07/29/2009   Coagulation defect, unspecified ([place]) 08/29/2006   Type 2 diabetes mellitus with diabetic peripheral angiopathy without gangrene ([place]) 08/29/2006   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 stage renal disease ([place]) 08/02/2006    Miliyah Luper, PTA 02/27/2021, 11:14 AM  [facility] @ [address] [place], [place], [address] Phone: [phone]   Fax:  [phone]  Name: Carl Hornig Sr. MRN: [id number] Date of Birth: 10/05/1960

## 2021-02-28 ENCOUNTER — Telehealth: Payer: Self-pay

## 2021-02-28 ENCOUNTER — Encounter (HOSPITAL_COMMUNITY): Payer: Self-pay | Admitting: Gastroenterology

## 2021-02-28 MED ORDER — PLENVU 140 G PO SOLR: ORAL | 0 refills | Status: DC

## 2021-02-28 NOTE — Progress Notes (Signed)
Attempted to obtain medical history via telephone, unable to reach at this time. I left a voicemail to return pre surgical testing department's phone call.  

## 2021-02-28 NOTE — Telephone Encounter (Signed)
Plenvu has been resent to patients pharmacy.

## 2021-03-01 DIAGNOSIS — E875 Hyperkalemia: Secondary | ICD-10-CM | POA: Diagnosis not present

## 2021-03-01 DIAGNOSIS — Z7682 Awaiting organ transplant status: Secondary | ICD-10-CM | POA: Diagnosis not present

## 2021-03-01 DIAGNOSIS — D631 Anemia in chronic kidney disease: Secondary | ICD-10-CM | POA: Diagnosis not present

## 2021-03-01 DIAGNOSIS — D689 Coagulation defect, unspecified: Secondary | ICD-10-CM | POA: Diagnosis not present

## 2021-03-01 DIAGNOSIS — E1129 Type 2 diabetes mellitus with other diabetic kidney complication: Secondary | ICD-10-CM | POA: Diagnosis not present

## 2021-03-01 DIAGNOSIS — N186 End stage renal disease: Secondary | ICD-10-CM | POA: Diagnosis not present

## 2021-03-01 DIAGNOSIS — Z992 Dependence on renal dialysis: Secondary | ICD-10-CM | POA: Diagnosis not present

## 2021-03-01 DIAGNOSIS — D509 Iron deficiency anemia, unspecified: Secondary | ICD-10-CM | POA: Diagnosis not present

## 2021-03-01 DIAGNOSIS — N2581 Secondary hyperparathyroidism of renal origin: Secondary | ICD-10-CM | POA: Diagnosis not present

## 2021-03-02 ENCOUNTER — Ambulatory Visit: Payer: Medicare Other

## 2021-03-02 ENCOUNTER — Other Ambulatory Visit: Payer: Self-pay

## 2021-03-02 ENCOUNTER — Telehealth: Payer: Self-pay | Admitting: Gastroenterology

## 2021-03-02 DIAGNOSIS — R29898 Other symptoms and signs involving the musculoskeletal system: Secondary | ICD-10-CM | POA: Diagnosis not present

## 2021-03-02 DIAGNOSIS — N186 End stage renal disease: Secondary | ICD-10-CM | POA: Diagnosis not present

## 2021-03-02 DIAGNOSIS — M6281 Muscle weakness (generalized): Secondary | ICD-10-CM | POA: Diagnosis not present

## 2021-03-02 DIAGNOSIS — R2689 Other abnormalities of gait and mobility: Secondary | ICD-10-CM

## 2021-03-02 DIAGNOSIS — M25562 Pain in left knee: Secondary | ICD-10-CM

## 2021-03-02 DIAGNOSIS — M25611 Stiffness of right shoulder, not elsewhere classified: Secondary | ICD-10-CM

## 2021-03-02 DIAGNOSIS — G8929 Other chronic pain: Secondary | ICD-10-CM

## 2021-03-02 DIAGNOSIS — M25552 Pain in left hip: Secondary | ICD-10-CM | POA: Diagnosis not present

## 2021-03-02 DIAGNOSIS — Z79899 Other long term (current) drug therapy: Secondary | ICD-10-CM | POA: Diagnosis not present

## 2021-03-02 DIAGNOSIS — R519 Headache, unspecified: Secondary | ICD-10-CM | POA: Diagnosis not present

## 2021-03-02 DIAGNOSIS — G63 Polyneuropathy in diseases classified elsewhere: Secondary | ICD-10-CM | POA: Diagnosis not present

## 2021-03-02 DIAGNOSIS — M1612 Unilateral primary osteoarthritis, left hip: Secondary | ICD-10-CM | POA: Diagnosis not present

## 2021-03-02 DIAGNOSIS — E7800 Pure hypercholesterolemia, unspecified: Secondary | ICD-10-CM | POA: Diagnosis not present

## 2021-03-02 DIAGNOSIS — M25652 Stiffness of left hip, not elsewhere classified: Secondary | ICD-10-CM | POA: Diagnosis not present

## 2021-03-02 DIAGNOSIS — Z0001 Encounter for general adult medical examination with abnormal findings: Secondary | ICD-10-CM | POA: Diagnosis not present

## 2021-03-02 DIAGNOSIS — M25561 Pain in right knee: Secondary | ICD-10-CM | POA: Diagnosis not present

## 2021-03-02 DIAGNOSIS — Z992 Dependence on renal dialysis: Secondary | ICD-10-CM | POA: Diagnosis not present

## 2021-03-02 DIAGNOSIS — Z23 Encounter for immunization: Secondary | ICD-10-CM | POA: Diagnosis not present

## 2021-03-02 MED ORDER — CLENPIQ 10-3.5-12 MG-GM -GM/160ML PO SOLN: 320.0000 mL | Freq: Once | ORAL | 0 refills | Status: AC

## 2021-03-02 NOTE — Telephone Encounter (Signed)
Patient called requesting an alternative prep because the one sent is to expensive.

## 2021-03-02 NOTE — Telephone Encounter (Signed)
Patient states that he would go by the pharmacy to see if he could pick up prep. Stating that they wanted cash and he did not have cash.

## 2021-03-02 NOTE — Therapy (Signed)
[facility] @ [address] [place], [place], [address] Phone: [phone]   Fax:  [phone]  Physical Therapy Treatment  Patient Details  Name: Carl Schwan Sr. MRN: [id number] Date of Birth: [birth year] Referring Provider (PT): Dr [name]   Encounter Date: 03/02/2021   PT End of Session - 03/02/21 1013     Visit Number 6    Date for PT Re-Evaluation 04/07/21    Authorization Type UHC Medicare    PT Start Time 0932    PT Stop Time 7026    PT Time Calculation (min) 42 min    Activity Tolerance Patient tolerated treatment well    Behavior During Therapy WFL for tasks assessed/performed             Past Medical History:  Diagnosis Date   Acute gastric ulcer with hemorrhage    Acute GI bleeding 07/31/2019   Acute pericarditis    Anemia    hx of   Anxiety    situational    Arthritis    on meds   Borderline diabetes    Cataract    bilateral sx   Chronic headaches    Depression    situational    Diverticulitis    ESRD (end stage renal disease) ([place])     On Renal Transplant List," [facility]; MWF" (10/23/2016)   GERD (gastroesophageal reflux disease)    with certain foods   GI bleed    Hypertension    diet controlled   Parathyroid abnormality (HCC)    ectopic parathyroid gland   Presence of arteriovenous fistula for hemodialysis, primary ([place])    RUE   Refusal of blood product    NO WHOLE BLOOD PROUCTS   Renal cell carcinoma ([place])    s/p hand assisted laparoscopic bilateral nephrectomies 11/29/17, + RCC left   Secondary hyperparathyroidism ([place])    Seizures ([place])    one episode in past, due to" elevated Potassium" 08/02/20- "at least 4 years ago"   Sleep apnea    doesn't use CPAP anymore since weight loss   Stroke [facility])    no residual    Past Surgical History:  Procedure Laterality Date   AV FISTULA PLACEMENT Right    right arm   BIOPSY  08/01/2019   Procedure: BIOPSY;  Surgeon: Juanita Craver, MD;  Location: [facility] ENDOSCOPY;  Service: Endoscopy;;   BIOPSY  11/18/2020   Procedure: BIOPSY;  Surgeon: Daryel November, MD;  Location: [facility] ENDOSCOPY;  Service: Gastroenterology;;   CATARACT EXTRACTION W/ INTRAOCULAR LENS  IMPLANT, BILATERAL     COLON SURGERY     COLONOSCOPY N/A 08/04/2015   Procedure: COLONOSCOPY;  Surgeon: Jerene Bears, MD;  Location: [facility] ENDOSCOPY;  Service: Endoscopy;  Laterality: N/A;   COLONOSCOPY  2017   JMP@ [facility]-good prep-mass -recall 1 yr   COLONOSCOPY WITH PROPOFOL N/A 11/25/2020   Procedure: COLONOSCOPY WITH PROPOFOL;  Surgeon: Sharyn Creamer, MD;  Location: [place];  Service: Gastroenterology;  Laterality: N/A;   ESOPHAGOGASTRODUODENOSCOPY N/A 08/01/2019   Procedure: ESOPHAGOGASTRODUODENOSCOPY (EGD);  Surgeon: Juanita Craver, MD;  Location: [facility] ENDOSCOPY;  Service: Endoscopy;  Laterality: N/A;   ESOPHAGOGASTRODUODENOSCOPY N/A 11/18/2020   Procedure: ESOPHAGOGASTRODUODENOSCOPY (EGD);  Surgeon: Daryel November, MD;  Location: [place];  Service: Gastroenterology;  Laterality: N/A;   ESOPHAGOGASTRODUODENOSCOPY (EGD) WITH PROPOFOL N/A 08/04/2019   Procedure: ESOPHAGOGASTRODUODENOSCOPY (EGD) WITH PROPOFOL;  Surgeon: Yetta Flock, MD;  Location: [place];  Service: Gastroenterology;  Laterality: N/A;  graft left arm Left    for dialysis x 2. Removed   HOT HEMOSTASIS  11/18/2020   Procedure: HOT HEMOSTASIS (ARGON PLASMA COAGULATION/BICAP);  Surgeon: Daryel November, MD;  Location: [place];  Service: Gastroenterology;;   INSERTION OF DIALYSIS CATHETER     Rt chest   LAPAROSCOPIC RIGHT COLECTOMY N/A 08/05/2015   Procedure: LAPAROSCOPIC RIGHT COLECTOMY- ASCENDING;  Surgeon: Stark Klein, MD;  Location: [place];  Service: General;  Laterality: N/A;   MASS EXCISION Left 05/28/2019   Procedure: EXCISION SOFT TISSUE MASS LEFT SHOULDER;  Surgeon: Armandina Gemma, MD;  [facility]: [facility];  Service: General;  Laterality: Left;   NEPHRECTOMY  Bilateral    PARATHYROIDECTOMY N/A 06/12/2016   Procedure: TOTAL PARATHYROIDECTOMY WITH AUTOTRANSPLANTATION TO LEFT FOREARM;  Surgeon: Armandina Gemma, MD;  Location: [place];  Service: General;  Laterality: N/A;   PARATHYROIDECTOMY N/A 10/23/2016   Procedure: PARATHYROIDECTOMY;  Surgeon: Armandina Gemma, MD;  Location: [place];  Service: General;  Laterality: N/A;   REVERSE SHOULDER ARTHROPLASTY Right 08/24/2020   Procedure: REVERSE SHOULDER ARTHROPLASTY;  Surgeon: Hiram Gash, MD;  Location: [place];  Service: Orthopedics;  Laterality: Right;   REVISON OF ARTERIOVENOUS FISTULA Right 07/16/2017   Procedure: REVISION OF ARTERIOVENOUS FISTULA  Right ARM;  Surgeon: Waynetta Sandy, MD;  Location: [place];  Service: Vascular;  Laterality: Right;   TOTAL HIP ARTHROPLASTY Left 11/14/2020   Procedure: LEFT TOTAL HIP ARTHROPLASTY ANTERIOR APPROACH;  Surgeon: Leandrew Koyanagi, MD;  Location: [place];  Service: Orthopedics;  Laterality: Left;   UPPER GASTROINTESTINAL ENDOSCOPY  2021   @ [facility]    There were no vitals filed for this visit.   Subjective Assessment - 03/02/21 0939     Subjective I'm doing well.  I had a physical this morning and had to change my appt time.    Pertinent History L THA in August 2022, R shoulder rotator cuff repair March 2022.    Currently in Pain? No/denies                               [facility] Adult PT Treatment/Exercise - 03/02/21 0001       Exercises   Exercises Shoulder      Knee/Hip Exercises: Stretches   Other Knee/Hip Stretches 2nd step hip flexor stretch with reach forward 10x each side      Knee/Hip Exercises: Aerobic   Nustep seat 15 L3 8 min PTA present to discuss current status      Knee/Hip Exercises: Machines for Strengthening   Cybex Leg Press Seat 9 BilLE 75# 20x, Unilateral 40# 15x each side      Knee/Hip Exercises: Standing   Lateral Step Up Both;1 set;10 reps;Hand Hold: 1;Step Height: 6"    Forward Step Up Right;Left;1 set;10  reps;Hand Hold: 2;Step Height: 6"    Other Standing Knee Exercises Seated on 2 blue mats: sit to stand  2x10- hands required to reduced uncontrolled descent   VC to control descent     Shoulder Exercises: ROM/Strengthening   Cybex Row Limitations Blue band rows 15x    Ball on Wall flexion with 2# ball x10    Sustained Retraction with Theraband wall sidles using wash cloth on Rt hand: 5" hold x 10                       PT Short Term Goals - 03/02/21 9518  PT SHORT TERM GOAL #1   Title Pt will be independent with initial HEP.    Status Achieved               PT Long Term Goals - 03/02/21 0948       PT LONG TERM GOAL #4   Title Pt able to negotiate steps with reciprocal pattern with use of unilateral rail to  allow him to do laundry in his home.    Baseline 50% of the time    Status On-going                   Plan - 03/02/21 0956     Clinical Impression Statement Pt did very well with exercise today including strength and endurance for shoulder and hip.  Pt is doing step-over-step gait pattern with negotiating steps ~50% of the time.  Pt demonstrates improved LE stability and continues to be challenged with use of Rt UE overhead and with weights.  Pt will continue to benefit from skilled PT to address hip and shoulder strength and endurance.    PT Frequency 2x / week    PT Duration 8 weeks    PT Treatment/Interventions ADLs/Self Care Home Management;Aquatic Therapy;Cryotherapy;Electrical Stimulation;Iontophoresis 4mg /ml Dexamethasone;Moist Heat;Gait training;Stair training;Functional mobility training;Therapeutic activities;Therapeutic exercise;Balance training;Neuromuscular re-education;Patient/family education;Manual techniques;Passive range of motion;Dry needling;Taping;Vasopneumatic Device;Joint Manipulations    PT Next Visit Plan progress HEP, strengthening of LEs, shoulder ROM    PT Home Exercise Plan Access Code: [email or web address]    Consulted and Agree  with Plan of Care Patient             Patient will benefit from skilled therapeutic intervention in order to improve the following deficits and impairments:  Decreased range of motion, Difficulty walking, Increased muscle spasms, Impaired UE functional use, Pain, Impaired flexibility, Decreased strength  Visit Diagnosis: Muscle weakness (generalized)  Other abnormalities of gait and mobility  Stiffness of right shoulder, not elsewhere classified  Chronic pain of left knee     Problem List Patient Active Problem List   Diagnosis Date Noted   Mucosal abnormality of esophagus    Mucosal abnormality of stomach    Mucosal abnormality of duodenum    Upper GI bleed    Left hip pain 11/16/2020   Type 2 diabetes mellitus with other specified complication ([place]) 35/46/5681   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/14/2020   Primary osteoarthritis of left hip 10/07/2020   Status post reverse total arthroplasty of right shoulder 08/24/2020   Diverticulitis 03/08/2020   ESRD on hemodialysis ([place]) 03/08/2020   Polyneuropathy in diseases classified elsewhere ([place]) 03/08/2020   Pure hypercholesterolemia 03/08/2020   Sacral back pain 03/08/2020   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 11/10/2019   Acute gastric ulcer with hemorrhage    Hypotension of hemodialysis 07/31/2019   Diverticulitis large intestine 07/31/2019   Mass of soft tissue of shoulder 05/26/2019   Awaiting organ transplant status 03/18/2019   Finding of cocaine in blood 02/25/2019   Anaphylactic shock, unspecified, sequela 11/21/2018   Hypercalcemia 10/28/2018   Diarrhea, unspecified 08/08/2018   Hyperkalemia 03/12/2018   Patient is Jehovah's Witness 01/30/2018   Renal cell carcinoma of left kidney ([place]) 12/24/2017   Fluid overload, unspecified 07/06/2017   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   Other specified disorders of kidney and ureter 11/22/2016   Secondary hyperparathyroidism of renal  origin ([place]) 10/23/2016   Other headache syndrome 08/21/2016   TIA (transient ischemic attack)    Essential hypertension  Seizures ([place])    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    Lung nodule    Diverticulitis of cecum 07/21/2015   Diverticulitis of large intestine without perforation or abscess without bleeding    Right lower quadrant pain    Obstructive sleep apnea 02/08/2015   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   Diverticulosis 07/13/2014   Lower GI bleed 07/12/2014   Refusal of blood transfusions as patient is Jehovah's Witness    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   HLD (hyperlipidemia)    PRES (posterior reversible encephalopathy syndrome)    History of lacunar cerebrovascular accident (CVA) 05/23/2014   Anemia of renal disease 05/23/2014   GERD (gastroesophageal reflux disease) 12/09/2012   Gastrointestinal bleeding 11/12/2012   Melena 11/12/2012   Other psychoactive substance dependence, uncomplicated ([place]) 36/68/1594   Iron deficiency anemia, unspecified 06/10/2012   Moderate protein-calorie malnutrition ([place]) 07/29/2009   Coagulation defect, unspecified ([place]) 08/29/2006   Type 2 diabetes mellitus with diabetic peripheral angiopathy without gangrene ([place]) 08/29/2006   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 stage renal disease ([place]) 08/02/2006   Sigurd Sos, PT 03/02/21 10:16 AM  [facility] @ [address] [place], [place], [address] Phone: [phone]   Fax:  [phone]  Name: Ranvir Renovato Sr. MRN: [id number] Date of Birth: 1960/03/15

## 2021-03-03 DIAGNOSIS — D689 Coagulation defect, unspecified: Secondary | ICD-10-CM | POA: Diagnosis not present

## 2021-03-03 DIAGNOSIS — E875 Hyperkalemia: Secondary | ICD-10-CM | POA: Diagnosis not present

## 2021-03-03 DIAGNOSIS — Z7682 Awaiting organ transplant status: Secondary | ICD-10-CM | POA: Diagnosis not present

## 2021-03-03 DIAGNOSIS — Z992 Dependence on renal dialysis: Secondary | ICD-10-CM | POA: Diagnosis not present

## 2021-03-03 DIAGNOSIS — D509 Iron deficiency anemia, unspecified: Secondary | ICD-10-CM | POA: Diagnosis not present

## 2021-03-03 DIAGNOSIS — N2581 Secondary hyperparathyroidism of renal origin: Secondary | ICD-10-CM | POA: Diagnosis not present

## 2021-03-03 DIAGNOSIS — D631 Anemia in chronic kidney disease: Secondary | ICD-10-CM | POA: Diagnosis not present

## 2021-03-03 DIAGNOSIS — E1129 Type 2 diabetes mellitus with other diabetic kidney complication: Secondary | ICD-10-CM | POA: Diagnosis not present

## 2021-03-03 DIAGNOSIS — N186 End stage renal disease: Secondary | ICD-10-CM | POA: Diagnosis not present

## 2021-03-06 ENCOUNTER — Telehealth: Payer: Self-pay | Admitting: Gastroenterology

## 2021-03-06 ENCOUNTER — Encounter: Payer: Self-pay | Admitting: Physical Therapy

## 2021-03-06 ENCOUNTER — Ambulatory Visit: Payer: Medicare Other | Admitting: Physical Therapy

## 2021-03-06 ENCOUNTER — Other Ambulatory Visit: Payer: Self-pay

## 2021-03-06 DIAGNOSIS — N2581 Secondary hyperparathyroidism of renal origin: Secondary | ICD-10-CM | POA: Diagnosis not present

## 2021-03-06 DIAGNOSIS — R2689 Other abnormalities of gait and mobility: Secondary | ICD-10-CM | POA: Diagnosis not present

## 2021-03-06 DIAGNOSIS — M25561 Pain in right knee: Secondary | ICD-10-CM | POA: Diagnosis not present

## 2021-03-06 DIAGNOSIS — D689 Coagulation defect, unspecified: Secondary | ICD-10-CM | POA: Diagnosis not present

## 2021-03-06 DIAGNOSIS — Z7682 Awaiting organ transplant status: Secondary | ICD-10-CM | POA: Diagnosis not present

## 2021-03-06 DIAGNOSIS — M25562 Pain in left knee: Secondary | ICD-10-CM | POA: Diagnosis not present

## 2021-03-06 DIAGNOSIS — D631 Anemia in chronic kidney disease: Secondary | ICD-10-CM | POA: Diagnosis not present

## 2021-03-06 DIAGNOSIS — M6281 Muscle weakness (generalized): Secondary | ICD-10-CM

## 2021-03-06 DIAGNOSIS — M25611 Stiffness of right shoulder, not elsewhere classified: Secondary | ICD-10-CM

## 2021-03-06 DIAGNOSIS — G8929 Other chronic pain: Secondary | ICD-10-CM

## 2021-03-06 DIAGNOSIS — N186 End stage renal disease: Secondary | ICD-10-CM | POA: Diagnosis not present

## 2021-03-06 DIAGNOSIS — M25552 Pain in left hip: Secondary | ICD-10-CM | POA: Diagnosis not present

## 2021-03-06 DIAGNOSIS — M25652 Stiffness of left hip, not elsewhere classified: Secondary | ICD-10-CM | POA: Diagnosis not present

## 2021-03-06 DIAGNOSIS — R29898 Other symptoms and signs involving the musculoskeletal system: Secondary | ICD-10-CM | POA: Diagnosis not present

## 2021-03-06 DIAGNOSIS — D509 Iron deficiency anemia, unspecified: Secondary | ICD-10-CM | POA: Diagnosis not present

## 2021-03-06 DIAGNOSIS — E1129 Type 2 diabetes mellitus with other diabetic kidney complication: Secondary | ICD-10-CM | POA: Diagnosis not present

## 2021-03-06 DIAGNOSIS — E875 Hyperkalemia: Secondary | ICD-10-CM | POA: Diagnosis not present

## 2021-03-06 DIAGNOSIS — Z992 Dependence on renal dialysis: Secondary | ICD-10-CM | POA: Diagnosis not present

## 2021-03-06 NOTE — Therapy (Signed)
[facility] @ [address] [place], [place], [address] Phone: [phone]   Fax:  [phone]  Physical Therapy Treatment  Patient Details  Name: Carl Grasse Sr. MRN: [id number] Date of Birth: 19-Jul-1960 Referring Provider (PT): Dr [name]   Encounter Date: 03/06/2021   PT End of Session - 03/06/21 1029     Visit Number 7    Date for PT Re-Evaluation 04/07/21    Authorization Type UHC Medicare    PT Start Time 1027   15 min late   PT Stop Time 1100    PT Time Calculation (min) 33 min    Activity Tolerance Patient tolerated treatment well    Behavior During Therapy WFL for tasks assessed/performed             Past Medical History:  Diagnosis Date   Acute gastric ulcer with hemorrhage    Acute GI bleeding 07/31/2019   Acute pericarditis    Anemia    hx of   Anxiety    situational    Arthritis    on meds   Borderline diabetes    Cataract    bilateral sx   Chronic headaches    Depression    situational    Diverticulitis    ESRD (end stage renal disease) ([place])     On Renal Transplant List," [facility]; MWF" (10/23/2016)   GERD (gastroesophageal reflux disease)    with certain foods   GI bleed    Hypertension    diet controlled   Parathyroid abnormality ([place])    ectopic parathyroid gland   Presence of arteriovenous fistula for hemodialysis, primary ([place])    RUE   Refusal of blood product    NO WHOLE BLOOD PROUCTS   Renal cell carcinoma ([place])    s/p hand assisted laparoscopic bilateral nephrectomies 11/29/17, + RCC left   Secondary hyperparathyroidism ([place])    Seizures ([place])    one episode in past, due to" elevated Potassium" 08/02/20- "at least 4 years ago"   Sleep apnea    doesn't use CPAP anymore since weight loss   Stroke [facility])    no residual    Past Surgical History:  Procedure Laterality Date   AV FISTULA PLACEMENT Right    right arm   BIOPSY  08/01/2019   Procedure: BIOPSY;   Surgeon: Juanita Craver, MD;  Location: [facility] ENDOSCOPY;  Service: Endoscopy;;   BIOPSY  11/18/2020   Procedure: BIOPSY;  Surgeon: Daryel November, MD;  Location: [facility] ENDOSCOPY;  Service: Gastroenterology;;   CATARACT EXTRACTION W/ INTRAOCULAR LENS  IMPLANT, BILATERAL     COLON SURGERY     COLONOSCOPY N/A 08/04/2015   Procedure: COLONOSCOPY;  Surgeon: Jerene Bears, MD;  Location: [facility] ENDOSCOPY;  Service: Endoscopy;  Laterality: N/A;   COLONOSCOPY  2017   JMP@ [facility]-good prep-mass -recall 1 yr   COLONOSCOPY WITH PROPOFOL N/A 11/25/2020   Procedure: COLONOSCOPY WITH PROPOFOL;  Surgeon: Sharyn Creamer, MD;  Location: [place];  Service: Gastroenterology;  Laterality: N/A;   ESOPHAGOGASTRODUODENOSCOPY N/A 08/01/2019   Procedure: ESOPHAGOGASTRODUODENOSCOPY (EGD);  Surgeon: Juanita Craver, MD;  Location[facility] ENDOSCOPY;  Service: Endoscopy;  Laterality: N/A;   ESOPHAGOGASTRODUODENOSCOPY N/A 11/18/2020   Procedure: ESOPHAGOGASTRODUODENOSCOPY (EGD);  Surgeon: Daryel November, MD;  Location: [place];  Service: Gastroenterology;  Laterality: N/A;   ESOPHAGOGASTRODUODENOSCOPY (EGD) WITH PROPOFOL N/A 08/04/2019   Procedure: ESOPHAGOGASTRODUODENOSCOPY (EGD) WITH PROPOFOL;  Surgeon: Yetta Flock, MD;  Location: [place];  Service: Gastroenterology;  Laterality: N/A;   graft left arm Left    for dialysis x 2. Removed   HOT HEMOSTASIS  11/18/2020   Procedure: HOT HEMOSTASIS (ARGON PLASMA COAGULATION/BICAP);  Surgeon: Daryel November, MD;  Location: [place];  Service: Gastroenterology;;   INSERTION OF DIALYSIS CATHETER     Rt chest   LAPAROSCOPIC RIGHT COLECTOMY N/A 08/05/2015   Procedure: LAPAROSCOPIC RIGHT COLECTOMY- ASCENDING;  Surgeon: Stark Klein, MD;  Location: [place];  Service: General;  Laterality: N/A;   MASS EXCISION Left 05/28/2019   Procedure: EXCISION SOFT TISSUE MASS LEFT SHOULDER;  Surgeon: Armandina Gemma, MD;  Location: [facility];  Service: General;  Laterality: Left;    NEPHRECTOMY Bilateral    PARATHYROIDECTOMY N/A 06/12/2016   Procedure: TOTAL PARATHYROIDECTOMY WITH AUTOTRANSPLANTATION TO LEFT FOREARM;  Surgeon: Armandina Gemma, MD;  Location: [place];  Service: General;  Laterality: N/A;   PARATHYROIDECTOMY N/A 10/23/2016   Procedure: PARATHYROIDECTOMY;  Surgeon: Armandina Gemma, MD;  Location: [place];  Service: General;  Laterality: N/A;   REVERSE SHOULDER ARTHROPLASTY Right 08/24/2020   Procedure: REVERSE SHOULDER ARTHROPLASTY;  Surgeon: Hiram Gash, MD;  Location: [place];  Service: Orthopedics;  Laterality: Right;   REVISON OF ARTERIOVENOUS FISTULA Right 07/16/2017   Procedure: REVISION OF ARTERIOVENOUS FISTULA  Right ARM;  Surgeon: Waynetta Sandy, MD;  Location: [place];  Service: Vascular;  Laterality: Right;   TOTAL HIP ARTHROPLASTY Left 11/14/2020   Procedure: LEFT TOTAL HIP ARTHROPLASTY ANTERIOR APPROACH;  Surgeon: Leandrew Koyanagi, MD;  Location: [place];  Service: Orthopedics;  Laterality: Left;   UPPER GASTROINTESTINAL ENDOSCOPY  2021   @ [facility]    There were no vitals filed for this visit.   Subjective Assessment - 03/06/21 1030     Subjective I just came from dialysis. Overall I'm ok.    Pertinent History L THA in August 2022, R shoulder rotator cuff repair March 2022.    Currently in Pain? Yes    Pain Score 2     Pain Location Shoulder    Pain Orientation Right                               [facility] Adult PT Treatment/Exercise - 03/06/21 0001       Knee/Hip Exercises: Stretches   Other Knee/Hip Stretches 2nd step hip flexor stretch with reach forward 10x each side      Knee/Hip Exercises: Aerobic   Nustep 5 Min at beginning , 5 min to end secondary to being late. L3      Knee/Hip Exercises: Machines for Strengthening   Cybex Leg Press Seat 9 BilLE 80# 20x, Unilateral 40# 15x each side      Knee/Hip Exercises: Standing   Lateral Step Up Both;1 set;10 reps;Hand Hold: 1;Step Height: 6"    Forward Step Up Right;Left;1  set;10 reps;Hand Hold: 2;Step Height: 6"      Shoulder Exercises: Supine   Other Supine Exercises Supine bench press cane plus 2# 2x10, then overhead cane 2x10 still with 2# on cane      Shoulder Exercises: ROM/Strengthening   Cybex Row Limitations Blue band rows 2x10                       PT Short Term Goals - 03/02/21 3785       PT SHORT TERM GOAL #1   Title Pt will be independent with initial HEP.    Status  Achieved               PT Long Term Goals - 03/02/21 0948       PT LONG TERM GOAL #4   Title Pt able to negotiate steps with reciprocal pattern with use of unilateral rail to  allow him to do laundry in his home.    Baseline 50% of the time    Status On-going                   Plan - 03/06/21 1032     Clinical Impression Statement Pt arrives 15 min late, he comes from dialysis. He reports doing well ovr all with all his exercises. Added 2# to bench press and overhead stretch in supine, pt felt this was an easy weight to do.    Personal Factors and Comorbidities Fitness;Comorbidity 3+    Comorbidities L THA, R shoulder surgery, ESRD on hemodialysis    Examination-Activity Limitations Locomotion Level;Reach Overhead;Squat;Stairs    Examination-Participation Restrictions Community Activity;Yard Work    Merchant [employer] officer Evolving/Moderate complexity    Rehab Potential Good    PT Frequency 2x / week    PT Duration 8 weeks    PT Treatment/Interventions ADLs/Self Care Home Management;Aquatic Therapy;Cryotherapy;Electrical Stimulation;Iontophoresis 4mg /ml Dexamethasone;Moist Heat;Gait training;Stair training;Functional mobility training;Therapeutic activities;Therapeutic exercise;Balance training;Neuromuscular re-education;Patient/family education;Manual techniques;Passive range of motion;Dry needling;Taping;Vasopneumatic Device;Joint Manipulations    PT Next Visit Plan progress HEP, strengthening of LEs, shoulder ROM    PT Home  Exercise Plan Access Code: [email or web address]    Consulted and Agree with Plan of Care Patient             Patient will benefit from skilled therapeutic intervention in order to improve the following deficits and impairments:  Decreased range of motion, Difficulty walking, Increased muscle spasms, Impaired UE functional use, Pain, Impaired flexibility, Decreased strength  Visit Diagnosis: Muscle weakness (generalized)  Other abnormalities of gait and mobility  Stiffness of right shoulder, not elsewhere classified  Chronic pain of left knee     Problem List Patient Active Problem List   Diagnosis Date Noted   Mucosal abnormality of esophagus    Mucosal abnormality of stomach    Mucosal abnormality of duodenum    Upper GI bleed    Left hip pain 11/16/2020   Type 2 diabetes mellitus with other specified complication ([place]) 09/32/6712   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/14/2020   Primary osteoarthritis of left hip 10/07/2020   Status post reverse total arthroplasty of right shoulder 08/24/2020   Diverticulitis 03/08/2020   ESRD on hemodialysis ([place]) 03/08/2020   Polyneuropathy in diseases classified elsewhere ([place]) 03/08/2020   Pure hypercholesterolemia 03/08/2020   Sacral back pain 03/08/2020   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 11/10/2019   Acute gastric ulcer with hemorrhage    Hypotension of hemodialysis 07/31/2019   Diverticulitis large intestine 07/31/2019   Mass of soft tissue of shoulder 05/26/2019   Awaiting organ transplant status 03/18/2019   Finding of cocaine in blood 02/25/2019   Anaphylactic shock, unspecified, sequela 11/21/2018   Hypercalcemia 10/28/2018   Diarrhea, unspecified 08/08/2018   Hyperkalemia 03/12/2018   Patient is Jehovah's Witness 01/30/2018   Renal cell carcinoma of left kidney ([place]) 12/24/2017   Fluid overload, unspecified 07/06/2017   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   Other specified disorders of kidney and  ureter 11/22/2016   Secondary hyperparathyroidism of renal origin ([place]) 10/23/2016   Other headache syndrome 08/21/2016   TIA (transient ischemic attack)  Essential hypertension    Seizures ([place])    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    Lung nodule    Diverticulitis of cecum 07/21/2015   Diverticulitis of large intestine without perforation or abscess without bleeding    Right lower quadrant pain    Obstructive sleep apnea 02/08/2015   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   Diverticulosis 07/13/2014   Lower GI bleed 07/12/2014   Refusal of blood transfusions as patient is Jehovah's Witness    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   HLD (hyperlipidemia)    PRES (posterior reversible encephalopathy syndrome)    History of lacunar cerebrovascular accident (CVA) 05/23/2014   Anemia of renal disease 05/23/2014   GERD (gastroesophageal reflux disease) 12/09/2012   Gastrointestinal bleeding 11/12/2012   Melena 11/12/2012   Other psychoactive substance dependence, uncomplicated ([place]) 02/72/5366   Iron deficiency anemia, unspecified 06/10/2012   Moderate protein-calorie malnutrition ([place]) 07/29/2009   Coagulation defect, unspecified ([place]) 08/29/2006   Type 2 diabetes mellitus with diabetic peripheral angiopathy without gangrene ([place]) 08/29/2006   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 stage renal disease ([place]) 08/02/2006    Dow Blahnik, PTA 03/06/2021, 10:55 AM  [facility] @ [address] [place], [place], [address] Phone: [phone]   Fax:  [phone]  Name: Carl Kyler Sr. MRN: [id number] Date of Birth: 1960-06-22

## 2021-03-06 NOTE — Telephone Encounter (Signed)
Patient called and needs to cancel his hospital procedure on Thursday as he has a conflict with other appointments that have been scheduled.  He would, however, like to reschedule.  Please call patient.  Thank you.

## 2021-03-06 NOTE — Telephone Encounter (Signed)
Called and spoke with pt. Pt stated that due to a death in the family he will need to reschedule his EGD/colonoscopy at [facility] on 03/09/21. Pt states he can only do Tuesdays or Thursdays because of dialysis. Unable to find a hospital spot. Please advise.

## 2021-03-07 NOTE — Telephone Encounter (Signed)
Spoke with pt to let pt know what Dr. Candis Schatz had said. Pt verbalized understanding and had no other concerns at end of call.

## 2021-03-08 DIAGNOSIS — D509 Iron deficiency anemia, unspecified: Secondary | ICD-10-CM | POA: Diagnosis not present

## 2021-03-08 DIAGNOSIS — D631 Anemia in chronic kidney disease: Secondary | ICD-10-CM | POA: Diagnosis not present

## 2021-03-08 DIAGNOSIS — D689 Coagulation defect, unspecified: Secondary | ICD-10-CM | POA: Diagnosis not present

## 2021-03-08 DIAGNOSIS — E875 Hyperkalemia: Secondary | ICD-10-CM | POA: Diagnosis not present

## 2021-03-08 DIAGNOSIS — E1129 Type 2 diabetes mellitus with other diabetic kidney complication: Secondary | ICD-10-CM | POA: Diagnosis not present

## 2021-03-08 DIAGNOSIS — Z7682 Awaiting organ transplant status: Secondary | ICD-10-CM | POA: Diagnosis not present

## 2021-03-08 DIAGNOSIS — Z992 Dependence on renal dialysis: Secondary | ICD-10-CM | POA: Diagnosis not present

## 2021-03-08 DIAGNOSIS — N186 End stage renal disease: Secondary | ICD-10-CM | POA: Diagnosis not present

## 2021-03-08 DIAGNOSIS — N2581 Secondary hyperparathyroidism of renal origin: Secondary | ICD-10-CM | POA: Diagnosis not present

## 2021-03-08 NOTE — Progress Notes (Signed)
STROKE NEUROLOGY FOLLOW UP NOTE  NAME: Carl Naugle Sr. DOB: 05-15-1960  REASON FOR VISIT: stroke/headache follow-up HISTORY FROM: pt and chart   Chief Complaint  Patient presents with   Follow-up    Rm 3 alone Pt is well and stable, no new concerns      HPI:  Mr. Carl Dani Sr. is a 61 y.o. male with PMHx of stroke 05/2014, HTN, HLD, ESRD on HD, chronic headaches and seizure-like activity.  He has been followed in this office by Dr. Erlinda Hong since 2016 and returns today, 03/09/2020, for yearly follow-up, previously seen on 03/08/2020.  Stable from stroke standpoint without new or reoccurring stroke/TIA symptoms.  Remains on aspirin 81 mg daily and atorvastatin for secondary stroke prevention without side effects.  Blood pressure today satisfactory at 121/68.  Chronic tension type headache stable with no recent headaches. Will take a 25mg  tab topamax as needed with benefit, rx'd for 25mg  twice daily but he is trying to limited his daily medication use. Continues to follow with [facility] awaiting to be placed on kidney transplant list - continued HD 3x weekly. Did undergo L hip replacement 4 mo ago still working with PT and R shoulder replacement 3 months ago. Gradually recovering well. No new concerns at this time.        REVIEW OF SYSTEMS: Full 14 system review of systems performed and notable only for those listed below and in HPI above, all others are negative:  Joint stiffness   Past Medical History:  Diagnosis Date   Acute gastric ulcer with hemorrhage    Acute GI bleeding 07/31/2019   Acute pericarditis    Anemia    hx of   Anxiety    situational    Arthritis    on meds   Borderline diabetes    Cataract    bilateral sx   Chronic headaches    Depression    situational    Diverticulitis    ESRD (end stage renal disease) ([place])     On Renal Transplant List," [facility]; MWF" (10/23/2016)   GERD (gastroesophageal reflux disease)    with certain foods   GI bleed     Hypertension    diet controlled   Parathyroid abnormality (HCC)    ectopic parathyroid gland   Presence of arteriovenous fistula for hemodialysis, primary ([place])    RUE   Refusal of blood product    NO WHOLE BLOOD PROUCTS   Renal cell carcinoma ([place])    s/p hand assisted laparoscopic bilateral nephrectomies 11/29/17, + RCC left   Secondary hyperparathyroidism ([place])    Seizures ([place])    one episode in past, due to" elevated Potassium" 08/02/20- "at least 4 years ago"   Sleep apnea    doesn't use CPAP anymore since weight loss   Stroke [facility])    no residual    Current Outpatient Medications on File Prior to Visit  Medication Sig Dispense Refill   amoxicillin (AMOXIL) 500 MG capsule Take 4 pills one hour prior to dental work/procedure 8 capsule 2   atorvastatin (LIPITOR) 10 MG tablet Take 10 mg by mouth every evening.     B Complex-C-Folic Acid (DIALYVITE PO) Take 1 tablet by mouth daily.     camphor-menthol (SARNA) lotion Apply topically 2 (two) times daily. 222 mL 0   cinacalcet (SENSIPAR) 60 MG tablet Take 120 mg by mouth daily with supper.     cromolyn (OPTICROM) 4 % ophthalmic solution Place 1 drop into both eyes daily as  needed (redness).     docusate sodium (COLACE) 100 MG capsule Take 1 capsule (100 mg total) by mouth daily as needed. (Patient taking differently: Take 100 mg by mouth in the morning and at bedtime.) 30 capsule 2   midodrine (PROAMATINE) 10 MG tablet Take 10 mg by mouth See admin instructions. Take 10 mg by mouth three times a week as directed, bring to dialysis. May need an additional tablet during treatment.     PEG-KCl-NaCl-NaSulf-Na Asc-C (PLENVU) 140 g SOLR Use as directed. Manufacturer's coupon Universal coupon code:BIN: P2366821; GROUP: [id number]; PCN: [id number]; ID: [id number]; PAY NO MORE $50; NO prior authorization 1 each 0   topiramate (TOPAMAX) 25 MG tablet Take 1 tablet (25 mg total) by mouth 2 (two) times daily. 180 tablet 3   vitamin E 180 MG (400 UNITS)  capsule Take 400 Units by mouth daily.     No current facility-administered medications on file prior to visit.   Past Surgical History:  Procedure Laterality Date   AV FISTULA PLACEMENT Right    right arm   BIOPSY  08/01/2019   Procedure: BIOPSY;  Surgeon: Juanita Craver, MD;  Location: [facility] ENDOSCOPY;  Service: Endoscopy;;   BIOPSY  11/18/2020   Procedure: BIOPSY;  Surgeon: Daryel November, MD;  Location: [facility] ENDOSCOPY;  Service: Gastroenterology;;   CATARACT EXTRACTION W/ INTRAOCULAR LENS  IMPLANT, BILATERAL     COLON SURGERY     COLONOSCOPY N/A 08/04/2015   Procedure: COLONOSCOPY;  Surgeon: Jerene Bears, MD;  Location: [facility] ENDOSCOPY;  Service: Endoscopy;  Laterality: N/A;   COLONOSCOPY  2017   JMP@ [facility]-good prep-mass -recall 1 yr   COLONOSCOPY WITH PROPOFOL N/A 11/25/2020   Procedure: COLONOSCOPY WITH PROPOFOL;  Surgeon: Sharyn Creamer, MD;  Location: [place];  Service: Gastroenterology;  Laterality: N/A;   ESOPHAGOGASTRODUODENOSCOPY N/A 08/01/2019   Procedure: ESOPHAGOGASTRODUODENOSCOPY (EGD);  Surgeon: Juanita Craver, MD;  Location: [facility] ENDOSCOPY;  Service: Endoscopy;  Laterality: N/A;   ESOPHAGOGASTRODUODENOSCOPY N/A 11/18/2020   Procedure: ESOPHAGOGASTRODUODENOSCOPY (EGD);  Surgeon: Daryel November, MD;  Location: [place];  Service: Gastroenterology;  Laterality: N/A;   ESOPHAGOGASTRODUODENOSCOPY (EGD) WITH PROPOFOL N/A 08/04/2019   Procedure: ESOPHAGOGASTRODUODENOSCOPY (EGD) WITH PROPOFOL;  Surgeon: Yetta Flock, MD;  Location: [place];  Service: Gastroenterology;  Laterality: N/A;   graft left arm Left    for dialysis x 2. Removed   HOT HEMOSTASIS  11/18/2020   Procedure: HOT HEMOSTASIS (ARGON PLASMA COAGULATION/BICAP);  Surgeon: Daryel November, MD;  Location: [place];  Service: Gastroenterology;;   INSERTION OF DIALYSIS CATHETER     Rt chest   LAPAROSCOPIC RIGHT COLECTOMY N/A 08/05/2015   Procedure: LAPAROSCOPIC RIGHT COLECTOMY- ASCENDING;   Surgeon: Stark Klein, MD;  Location: [place];  Service: General;  Laterality: N/A;   MASS EXCISION Left 05/28/2019   Procedure: EXCISION SOFT TISSUE MASS LEFT SHOULDER;  Surgeon: Armandina Gemma, MD;  Location: [facility];  Service: General;  Laterality: Left;   NEPHRECTOMY Bilateral    PARATHYROIDECTOMY N/A 06/12/2016   Procedure: TOTAL PARATHYROIDECTOMY WITH AUTOTRANSPLANTATION TO LEFT FOREARM;  Surgeon: Armandina Gemma, MD;  Location: [place];  Service: General;  Laterality: N/A;   PARATHYROIDECTOMY N/A 10/23/2016   Procedure: PARATHYROIDECTOMY;  Surgeon: Armandina Gemma, MD;  Location: [place];  Service: General;  Laterality: N/A;   REVERSE SHOULDER ARTHROPLASTY Right 08/24/2020   Procedure: REVERSE SHOULDER ARTHROPLASTY;  Surgeon: Hiram Gash, MD;  Location: [place];  Service: Orthopedics;  Laterality: Right;   REVISON OF ARTERIOVENOUS FISTULA Right  07/16/2017   Procedure: REVISION OF ARTERIOVENOUS FISTULA  Right ARM;  Surgeon: Waynetta Sandy, MD;  Location: [place];  Service: Vascular;  Laterality: Right;   TOTAL HIP ARTHROPLASTY Left 11/14/2020   Procedure: LEFT TOTAL HIP ARTHROPLASTY ANTERIOR APPROACH;  Surgeon: Leandrew Koyanagi, MD;  Location: [place];  Service: Orthopedics;  Laterality: Left;   UPPER GASTROINTESTINAL ENDOSCOPY  2021   @ [facility]     The following represents the patient's updated allergies and side effects list: Allergies  Allergen Reactions   Infed [Iron Dextran] Other (See Comments)    Decreased BP    Oxycodone Nausea Only   Vicodin [Hydrocodone-Acetaminophen] Other (See Comments)    Decreased BP   Hydrocodone-Acetaminophen     Other reaction(s): lower BP   Diclofenac Other (See Comments)    Unknown    The neurologically relevant items on the patient's problem list were reviewed on today's visit.    Physical exam  Today's Vitals   03/09/21 0730  BP: 121/68  Pulse: 90  Weight: 195 lb (88.5 kg)  Height: 6\' 2"  (1.88 m)    Body mass index is 25.04 kg/m.  General:  well developed, well nourished, pleasant middle-age African-American male, seated, in no evident distress Head: head normocephalic and atraumatic.   Neck: supple with no carotid or supraclavicular bruits Cardiovascular: regular rate and rhythm, no murmurs Musculoskeletal: no deformity Skin:  no rash/petichiae Vascular:  Normal pulses all extremities   Neurologic Exam Mental Status: Awake and fully alert.   Fluent speech and language.  Oriented to place and time. Recent and remote memory intact. Attention span, concentration and fund of knowledge appropriate. Mood and affect appropriate.  Cranial Nerves: Pupils equal, briskly reactive to light. Extraocular movements full without nystagmus. Visual fields full to confrontation. Hearing intact. Facial sensation intact. Face, tongue, palate moves normally and symmetrically.  Motor: Normal bulk and tone. Normal strength in all tested extremity muscles. Sensory.: intact to touch , pinprick , position and vibratory sensation.  Coordination: Rapid alternating movements normal in all extremities. Finger-to-nose and heel-to-shin performed accurately bilaterally. Gait and Station: Arises from chair without difficulty. Stance is normal. Gait demonstrates normal stride length and balance with mild favoring of LLE d/t recent hip replacement. No assistive device.  Reflexes: 1+ and symmetric. Toes downgoing.       Assessment: [name] is a 61 year old male with history of left corona radiata infarct and possible proximal on 05/2014 without residual deficits.  Vascular risk factors include HTN, HLD and ESRD on HD as well as chronic headaches   Plan:  -Continue topamax for HA prevention - rx'd for 25mg  BID but takes PRN which works well for him - refill provided - continue ASA 81mg  daily and lipitor 10mg  daily for stroke prevention -Discussed secondary stroke prevention measures and importance of routine follow up with your primary care physician for  stroke risk factor modification. Recommend maintain blood pressure goal <130/80 and lipids with LDL cholesterol goal below 70 mg/dL.   -lipid panel 02/2021 LDL 68    Follow-up in 1 year or call earlier if needed - if remains stable, can follow up as needed   CC:  Koirala, [place], MD    I spent 26 minutes of face-to-face and non-face-to-face time with patient.  This included previsit chart review, lab review, study review, electronic health record documentation, patient education and discussion regarding history of prior stroke and importance of managing stroke risk factors, chronic headaches and ongoing use of topiramate and answered  all other questions to patient satisfaction   Frann Rider, [facility]  [facility] [address] [place], [place] [address]  Phone [phone] Fax [phone] Note: This document was prepared with digital dictation and possible smart phrase technology. Any transcriptional errors that result from this process are unintentional.

## 2021-03-09 ENCOUNTER — Ambulatory Visit (HOSPITAL_COMMUNITY): Admit: 2021-03-09 | Payer: Commercial Managed Care - HMO | Admitting: Gastroenterology

## 2021-03-09 ENCOUNTER — Ambulatory Visit (INDEPENDENT_AMBULATORY_CARE_PROVIDER_SITE_OTHER): Payer: Medicare Other | Admitting: Adult Health

## 2021-03-09 ENCOUNTER — Encounter: Payer: Self-pay | Admitting: Adult Health

## 2021-03-09 VITALS — BP 121/68 | HR 90 | Ht 74.0 in | Wt 195.0 lb

## 2021-03-09 DIAGNOSIS — Z8673 Personal history of transient ischemic attack (TIA), and cerebral infarction without residual deficits: Secondary | ICD-10-CM | POA: Diagnosis not present

## 2021-03-09 DIAGNOSIS — G44229 Chronic tension-type headache, not intractable: Secondary | ICD-10-CM

## 2021-03-09 SURGERY — COLONOSCOPY WITH PROPOFOL
Anesthesia: Monitor Anesthesia Care

## 2021-03-09 NOTE — Patient Instructions (Addendum)
Continue aspirin 81 mg daily  and atorvastatin for secondary stroke prevention  Continue Topamax 25 mg twice daily lipid panel lipid panel  Continue to follow up with PCP regarding cholesterol and blood pressure management  Maintain strict control of hypertension with blood pressure goal below 130/90 and cholesterol with LDL cholesterol (bad cholesterol) goal below 70 mg/dL.   Signs of a Stroke? Follow the BEFAST method:  Balance Watch for a sudden loss of balance, trouble with coordination or vertigo Eyes Is there a sudden loss of vision in one or both eyes? Or double vision?  Face: Ask the person to smile. Does one side of the face droop or is it numb?  Arms: Ask the person to raise both arms. Does one arm drift downward? Is there weakness or numbness of a leg? Speech: Ask the person to repeat a simple phrase. Does the speech sound slurred/strange? Is the person confused ? Time: If you observe any of these signs, call 911.    Followup in the future with me in 1 year topiramate topiramate Topamax or call earlier if needed       Thank you for coming to see [place] at [facility]. I hope we have been able to provide you high quality care today.  You may receive a patient satisfaction survey over the next few weeks. We would appreciate your feedback and comments so that we may continue to improve ourselves and the health of our patients.

## 2021-03-10 ENCOUNTER — Encounter: Payer: Self-pay | Admitting: Physical Therapy

## 2021-03-10 ENCOUNTER — Other Ambulatory Visit: Payer: Self-pay

## 2021-03-10 ENCOUNTER — Ambulatory Visit: Payer: Medicare Other | Admitting: Physical Therapy

## 2021-03-10 DIAGNOSIS — R2689 Other abnormalities of gait and mobility: Secondary | ICD-10-CM

## 2021-03-10 DIAGNOSIS — M6281 Muscle weakness (generalized): Secondary | ICD-10-CM

## 2021-03-10 DIAGNOSIS — M25552 Pain in left hip: Secondary | ICD-10-CM | POA: Diagnosis not present

## 2021-03-10 DIAGNOSIS — E1129 Type 2 diabetes mellitus with other diabetic kidney complication: Secondary | ICD-10-CM | POA: Diagnosis not present

## 2021-03-10 DIAGNOSIS — N186 End stage renal disease: Secondary | ICD-10-CM | POA: Diagnosis not present

## 2021-03-10 DIAGNOSIS — G8929 Other chronic pain: Secondary | ICD-10-CM | POA: Diagnosis not present

## 2021-03-10 DIAGNOSIS — M25652 Stiffness of left hip, not elsewhere classified: Secondary | ICD-10-CM | POA: Diagnosis not present

## 2021-03-10 DIAGNOSIS — M25561 Pain in right knee: Secondary | ICD-10-CM | POA: Diagnosis not present

## 2021-03-10 DIAGNOSIS — M25611 Stiffness of right shoulder, not elsewhere classified: Secondary | ICD-10-CM

## 2021-03-10 DIAGNOSIS — N2581 Secondary hyperparathyroidism of renal origin: Secondary | ICD-10-CM | POA: Diagnosis not present

## 2021-03-10 DIAGNOSIS — D509 Iron deficiency anemia, unspecified: Secondary | ICD-10-CM | POA: Diagnosis not present

## 2021-03-10 DIAGNOSIS — D631 Anemia in chronic kidney disease: Secondary | ICD-10-CM | POA: Diagnosis not present

## 2021-03-10 DIAGNOSIS — Z7682 Awaiting organ transplant status: Secondary | ICD-10-CM | POA: Diagnosis not present

## 2021-03-10 DIAGNOSIS — Z992 Dependence on renal dialysis: Secondary | ICD-10-CM | POA: Diagnosis not present

## 2021-03-10 DIAGNOSIS — E875 Hyperkalemia: Secondary | ICD-10-CM | POA: Diagnosis not present

## 2021-03-10 DIAGNOSIS — R29898 Other symptoms and signs involving the musculoskeletal system: Secondary | ICD-10-CM | POA: Diagnosis not present

## 2021-03-10 DIAGNOSIS — D689 Coagulation defect, unspecified: Secondary | ICD-10-CM | POA: Diagnosis not present

## 2021-03-10 DIAGNOSIS — M25562 Pain in left knee: Secondary | ICD-10-CM

## 2021-03-10 NOTE — Therapy (Signed)
[facility] @ [address] [place], [place], [address] Phone: [phone]   Fax:  [phone]  Physical Therapy Treatment  Patient Details  Name: Carl Beckom Sr. MRN: [id number] Date of Birth: Aug 05, 1960 Referring Provider (PT): Dr [name]   Encounter Date: 03/10/2021   PT End of Session - 03/10/21 1022     Visit Number 8    Date for PT Re-Evaluation 04/07/21    Authorization Type UHC Medicare    PT Start Time 1019    PT Stop Time 1100    PT Time Calculation (min) 41 min    Activity Tolerance Patient tolerated treatment well    Behavior During Therapy WFL for tasks assessed/performed             Past Medical History:  Diagnosis Date   Acute gastric ulcer with hemorrhage    Acute GI bleeding 07/31/2019   Acute pericarditis    Anemia    hx of   Anxiety    situational    Arthritis    on meds   Borderline diabetes    Cataract    bilateral sx   Chronic headaches    Depression    situational    Diverticulitis    ESRD (end stage renal disease) ([place])     On Renal Transplant List," [facility]; MWF" (10/23/2016)   GERD (gastroesophageal reflux disease)    with certain foods   GI bleed    Hypertension    diet controlled   Parathyroid abnormality ([place])    ectopic parathyroid gland   Presence of arteriovenous fistula for hemodialysis, primary ([place])    RUE   Refusal of blood product    NO WHOLE BLOOD PROUCTS   Renal cell carcinoma ([place])    s/p hand assisted laparoscopic bilateral nephrectomies 11/29/17, + RCC left   Secondary hyperparathyroidism ([place])    Seizures ([place])    one episode in past, due to" elevated Potassium" 08/02/20- "at least 4 years ago"   Sleep apnea    doesn't use CPAP anymore since weight loss   Stroke [facility])    no residual    Past Surgical History:  Procedure Laterality Date   AV FISTULA PLACEMENT Right    right arm   BIOPSY  08/01/2019   Procedure: BIOPSY;  Surgeon: Juanita Craver, MD;  Location: [facility] ENDOSCOPY;  Service: Endoscopy;;   BIOPSY  11/18/2020   Procedure: BIOPSY;  Surgeon: Daryel November, MD;  Location: [facility] ENDOSCOPY;  Service: Gastroenterology;;   CATARACT EXTRACTION W/ INTRAOCULAR LENS  IMPLANT, BILATERAL     COLON SURGERY     COLONOSCOPY N/A 08/04/2015   Procedure: COLONOSCOPY;  Surgeon: Jerene Bears, MD;  Location: [facility] ENDOSCOPY;  Service: Endoscopy;  Laterality: N/A;   COLONOSCOPY  2017   JMP@ [facility]-good prep-mass -recall 1 yr   COLONOSCOPY WITH PROPOFOL N/A 11/25/2020   Procedure: COLONOSCOPY WITH PROPOFOL;  Surgeon: Sharyn Creamer, MD;  Location: [place];  Service: Gastroenterology;  Laterality: N/A;   ESOPHAGOGASTRODUODENOSCOPY N/A 08/01/2019   Procedure: ESOPHAGOGASTRODUODENOSCOPY (EGD);  Surgeon: Juanita Craver, MD;  [facility] ENDOSCOPY;  Service: Endoscopy;  Laterality: N/A;   ESOPHAGOGASTRODUODENOSCOPY N/A 11/18/2020   Procedure: ESOPHAGOGASTRODUODENOSCOPY (EGD);  Surgeon: Daryel November, MD;  Location: [place];  Service: Gastroenterology;  Laterality: N/A;   ESOPHAGOGASTRODUODENOSCOPY (EGD) WITH PROPOFOL N/A 08/04/2019   Procedure: ESOPHAGOGASTRODUODENOSCOPY (EGD) WITH PROPOFOL;  Surgeon: Yetta Flock, MD;  Location: [place];  Service: Gastroenterology;  Laterality: N/A;  graft left arm Left    for dialysis x 2. Removed   HOT HEMOSTASIS  11/18/2020   Procedure: HOT HEMOSTASIS (ARGON PLASMA COAGULATION/BICAP);  Surgeon: Daryel November, MD;  Location: [place];  Service: Gastroenterology;;   INSERTION OF DIALYSIS CATHETER     Rt chest   LAPAROSCOPIC RIGHT COLECTOMY N/A 08/05/2015   Procedure: LAPAROSCOPIC RIGHT COLECTOMY- ASCENDING;  Surgeon: Stark Klein, MD;  Location: [place];  Service: General;  Laterality: N/A;   MASS EXCISION Left 05/28/2019   Procedure: EXCISION SOFT TISSUE MASS LEFT SHOULDER;  Surgeon: Armandina Gemma, MD;  Location: [facility];  Service: General;  Laterality: Left;   NEPHRECTOMY  Bilateral    PARATHYROIDECTOMY N/A 06/12/2016   Procedure: TOTAL PARATHYROIDECTOMY WITH AUTOTRANSPLANTATION TO LEFT FOREARM;  Surgeon: Armandina Gemma, MD;  Location: [place];  Service: General;  Laterality: N/A;   PARATHYROIDECTOMY N/A 10/23/2016   Procedure: PARATHYROIDECTOMY;  Surgeon: Armandina Gemma, MD;  Location: [facility];  Service: General;  Laterality: N/A;   REVERSE SHOULDER ARTHROPLASTY Right 08/24/2020   Procedure: REVERSE SHOULDER ARTHROPLASTY;  Surgeon: Hiram Gash, MD;  Location: [place];  Service: Orthopedics;  Laterality: Right;   REVISON OF ARTERIOVENOUS FISTULA Right 07/16/2017   Procedure: REVISION OF ARTERIOVENOUS FISTULA  Right ARM;  Surgeon: Waynetta Sandy, MD;  Location: [place];  Service: Vascular;  Laterality: Right;   TOTAL HIP ARTHROPLASTY Left 11/14/2020   Procedure: LEFT TOTAL HIP ARTHROPLASTY ANTERIOR APPROACH;  Surgeon: Leandrew Koyanagi, MD;  Location: [place];  Service: Orthopedics;  Laterality: Left;   UPPER GASTROINTESTINAL ENDOSCOPY  2021   @ [facility]    There were no vitals filed for this visit.   Subjective Assessment - 03/10/21 1023     Subjective Dialysis wears me out, a little sore in my hip but not bad    Pertinent History L THA in August 2022, R shoulder rotator cuff repair March 2022.    Currently in Pain? Yes    Pain Location Hip    Pain Orientation Left    Pain Descriptors / Indicators Sore    Aggravating Factors  Just normal    Pain Relieving Factors rest    Multiple Pain Sites No                               [facility] Adult PT Treatment/Exercise - 03/10/21 0001       Knee/Hip Exercises: Aerobic   Nustep L3 x 10 min with PTA presen tto discuss status and goals.      Knee/Hip Exercises: Machines for Strengthening   Cybex Leg Press Seat 9 Bil 85# 2x15, 40# Single leg press Bil sides x15, then 45# 10x on second set      Knee/Hip Exercises: Standing   Lateral Step Up Both;1 set;Hand Hold: 1;Step Height: 6";15 reps    Forward Step  Up Right;Left;1 set;Hand Hold: 2;Step Height: 6";15 reps    Other Standing Knee Exercises On 2 pads: 10x, min use of UE for decent to keep his best control      Shoulder Exercises: Supine   Horizontal ABduction Strengthening;Both;10 reps    Theraband Level (Shoulder Horizontal ABduction) Level 1 (Yellow)    External Rotation Strengthening;AROM;Both;10 reps;Theraband    Theraband Level (Shoulder External Rotation) Level 1 (Yellow)    Other Supine Exercises RTUE punch 4# 10x 2   Vc to not roll shoulders   Other Supine Exercises Supine bench press with cane +  3# wt 3x10                       PT Short Term Goals - 03/02/21 0937       PT SHORT TERM GOAL #1   Title Pt will be independent with initial HEP.    Status Achieved               PT Long Term Goals - 03/02/21 0948       PT LONG TERM GOAL #4   Title Pt able to negotiate steps with reciprocal pattern with use of unilateral rail to  allow him to do laundry in his home.    Baseline 50% of the time    Status On-going                   Plan - 03/10/21 1026     Clinical Impression Statement Pt arrives with his normal fatigue coming straight from dialysis. Pt reports he can now reach into his cabinets both in the kitchen and bathroom easier; " I am pushing it to get it better." Pt is independent in dressing. Pt reports 1 step to get into the house from the [place] which he can navigate "pretty well." Increased resistance for LE which pt tolerates well, still has difficulty lifting RT arm over head in standing, continue to work in supine.    Personal Factors and Comorbidities Fitness;Comorbidity 3+    Comorbidities L THA, R shoulder surgery, ESRD on hemodialysis    Examination-Activity Limitations Locomotion Level;Reach Overhead;Squat;Stairs    Examination-Participation Restrictions Community Activity;Yard Work    Merchant navy officer Evolving/Moderate complexity    Rehab Potential Good    PT  Frequency 2x / week    PT Duration 8 weeks    PT Treatment/Interventions ADLs/Self Care Home Management;Aquatic Therapy;Cryotherapy;Electrical Stimulation;Iontophoresis 4mg /ml Dexamethasone;Moist Heat;Gait training;Stair training;Functional mobility training;Therapeutic activities;Therapeutic exercise;Balance training;Neuromuscular re-education;Patient/family education;Manual techniques;Passive range of motion;Dry needling;Taping;Vasopneumatic Device;Joint Manipulations    PT Next Visit Plan progress HEP, strengthening of LEs, shoulder ROM    PT Home Exercise Plan Access Code: [email or web address]    Consulted and Agree with Plan of Care Patient             Patient will benefit from skilled therapeutic intervention in order to improve the following deficits and impairments:  Decreased range of motion, Difficulty walking, Increased muscle spasms, Impaired UE functional use, Pain, Impaired flexibility, Decreased strength  Visit Diagnosis: Muscle weakness (generalized)  Other abnormalities of gait and mobility  Stiffness of right shoulder, not elsewhere classified  Chronic pain of left knee  Chronic pain of right knee  Stiffness of left hip, not elsewhere classified  Pain in left hip     Problem List Patient Active Problem List   Diagnosis Date Noted   Mucosal abnormality of esophagus    Mucosal abnormality of stomach    Mucosal abnormality of duodenum    Upper GI bleed    Left hip pain 11/16/2020   Type 2 diabetes mellitus with other specified complication ([place]) 17/61/6073   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/14/2020   Primary osteoarthritis of left hip 10/07/2020   Status post reverse total arthroplasty of right shoulder 08/24/2020   Diverticulitis 03/08/2020   ESRD on hemodialysis ([place]) 03/08/2020   Polyneuropathy in diseases classified elsewhere ([name]) 03/08/2020   Pure hypercholesterolemia 03/08/2020   Sacral back pain 03/08/2020   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  11/10/2019   Acute gastric ulcer with hemorrhage  Hypotension of hemodialysis 07/31/2019   Diverticulitis large intestine 07/31/2019   Mass of soft tissue of shoulder 05/26/2019   Awaiting organ transplant status 03/18/2019   Finding of cocaine in blood 02/25/2019   Anaphylactic shock, unspecified, sequela 11/21/2018   Hypercalcemia 10/28/2018   Diarrhea, unspecified 08/08/2018   Hyperkalemia 03/12/2018   Patient is Jehovah's Witness 01/30/2018   Renal cell carcinoma of left kidney ([place]) 12/24/2017   Fluid overload, unspecified 07/06/2017   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   Other specified disorders of kidney and ureter 11/22/2016   Secondary hyperparathyroidism of renal origin ([place]) 10/23/2016   Other headache syndrome 08/21/2016   TIA (transient ischemic attack)    Essential hypertension    Seizures ([place])    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    Lung nodule    Diverticulitis of cecum 07/21/2015   Diverticulitis of large intestine without perforation or abscess without bleeding    Right lower quadrant pain    Obstructive sleep apnea 02/08/2015   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   Diverticulosis 07/13/2014   Lower GI bleed 07/12/2014   Refusal of blood transfusions as patient is Jehovah's Witness    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   HLD (hyperlipidemia)    PRES (posterior reversible encephalopathy syndrome)    History of lacunar cerebrovascular accident (CVA) 05/23/2014   Anemia of renal disease 05/23/2014   GERD (gastroesophageal reflux disease) 12/09/2012   Gastrointestinal bleeding 11/12/2012   Melena 11/12/2012   Other psychoactive substance dependence, uncomplicated ([place]) 32/35/5732   Iron deficiency anemia, unspecified 06/10/2012   Moderate protein-calorie malnutrition ([place]) 07/29/2009   Coagulation defect, unspecified ([place]) 08/29/2006   Type 2 diabetes mellitus  with diabetic peripheral angiopathy without gangrene ([place]) 08/29/2006   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 stage renal disease ([place]) 08/02/2006    Christinamarie Tall, PTA 03/10/2021, 10:53 AM  [facility] @ [address] [place], [place], [address] Phone: [phone]   Fax:  [phone]  Name: Carl Gorniak Sr. MRN: [id number] Date of Birth: 1960-09-27

## 2021-03-13 ENCOUNTER — Ambulatory Visit: Payer: Medicare Other | Admitting: Physical Therapy

## 2021-03-13 ENCOUNTER — Encounter: Payer: Self-pay | Admitting: Physical Therapy

## 2021-03-13 ENCOUNTER — Other Ambulatory Visit: Payer: Self-pay

## 2021-03-13 DIAGNOSIS — D631 Anemia in chronic kidney disease: Secondary | ICD-10-CM | POA: Diagnosis not present

## 2021-03-13 DIAGNOSIS — M6281 Muscle weakness (generalized): Secondary | ICD-10-CM

## 2021-03-13 DIAGNOSIS — M25652 Stiffness of left hip, not elsewhere classified: Secondary | ICD-10-CM

## 2021-03-13 DIAGNOSIS — D689 Coagulation defect, unspecified: Secondary | ICD-10-CM | POA: Diagnosis not present

## 2021-03-13 DIAGNOSIS — G8929 Other chronic pain: Secondary | ICD-10-CM

## 2021-03-13 DIAGNOSIS — R2689 Other abnormalities of gait and mobility: Secondary | ICD-10-CM | POA: Diagnosis not present

## 2021-03-13 DIAGNOSIS — M25552 Pain in left hip: Secondary | ICD-10-CM | POA: Diagnosis not present

## 2021-03-13 DIAGNOSIS — N2581 Secondary hyperparathyroidism of renal origin: Secondary | ICD-10-CM | POA: Diagnosis not present

## 2021-03-13 DIAGNOSIS — M25561 Pain in right knee: Secondary | ICD-10-CM | POA: Diagnosis not present

## 2021-03-13 DIAGNOSIS — N186 End stage renal disease: Secondary | ICD-10-CM | POA: Diagnosis not present

## 2021-03-13 DIAGNOSIS — E875 Hyperkalemia: Secondary | ICD-10-CM | POA: Diagnosis not present

## 2021-03-13 DIAGNOSIS — R29898 Other symptoms and signs involving the musculoskeletal system: Secondary | ICD-10-CM | POA: Diagnosis not present

## 2021-03-13 DIAGNOSIS — E1129 Type 2 diabetes mellitus with other diabetic kidney complication: Secondary | ICD-10-CM | POA: Diagnosis not present

## 2021-03-13 DIAGNOSIS — Z7682 Awaiting organ transplant status: Secondary | ICD-10-CM | POA: Diagnosis not present

## 2021-03-13 DIAGNOSIS — Z992 Dependence on renal dialysis: Secondary | ICD-10-CM | POA: Diagnosis not present

## 2021-03-13 DIAGNOSIS — M25611 Stiffness of right shoulder, not elsewhere classified: Secondary | ICD-10-CM | POA: Diagnosis not present

## 2021-03-13 DIAGNOSIS — M25562 Pain in left knee: Secondary | ICD-10-CM | POA: Diagnosis not present

## 2021-03-13 DIAGNOSIS — D509 Iron deficiency anemia, unspecified: Secondary | ICD-10-CM | POA: Diagnosis not present

## 2021-03-13 NOTE — Therapy (Signed)
[facility] @ [address] [place], [place], [address] Phone: [phone]   Fax:  [phone]  Physical Therapy Treatment  Patient Details  Name: Carl Roots Sr. MRN: [id number] Date of Birth: [birth year] Referring Provider (PT): Dr [name]   Encounter Date: 03/13/2021   PT End of Session - 03/13/21 1102     Visit Number 9    Date for PT Re-Evaluation 04/07/21    Authorization Type UHC Medicare    PT Start Time 1100    PT Stop Time 1829    PT Time Calculation (min) 45 min    Activity Tolerance Patient tolerated treatment well    Behavior During Therapy WFL for tasks assessed/performed             Past Medical History:  Diagnosis Date   Acute gastric ulcer with hemorrhage    Acute GI bleeding 07/31/2019   Acute pericarditis    Anemia    hx of   Anxiety    situational    Arthritis    on meds   Borderline diabetes    Cataract    bilateral sx   Chronic headaches    Depression    situational    Diverticulitis    ESRD (end stage renal disease) ([place])     On Renal Transplant List," [facility]; MWF" (10/23/2016)   GERD (gastroesophageal reflux disease)    with certain foods   GI bleed    Hypertension    diet controlled   Parathyroid abnormality ([facility])    ectopic parathyroid gland   Presence of arteriovenous fistula for hemodialysis, primary ([place])    RUE   Refusal of blood product    NO WHOLE BLOOD PROUCTS   Renal cell carcinoma ([place])    s/p hand assisted laparoscopic bilateral nephrectomies 11/29/17, + RCC left   Secondary hyperparathyroidism ([place])    Seizures ([place])    one episode in past, due to" elevated Potassium" 08/02/20- "at least 4 years ago"   Sleep apnea    doesn't use CPAP anymore since weight loss   Stroke [facility])    no residual    Past Surgical History:  Procedure Laterality Date   AV FISTULA PLACEMENT Right    right arm   BIOPSY  08/01/2019   Procedure: BIOPSY;  Surgeon: Juanita Craver, MD;  Location: [facility] ENDOSCOPY;  Service: Endoscopy;;   BIOPSY  11/18/2020   Procedure: BIOPSY;  Surgeon: Daryel November, MD;  Location: [facility] ENDOSCOPY;  Service: Gastroenterology;;   CATARACT EXTRACTION W/ INTRAOCULAR LENS  IMPLANT, BILATERAL     COLON SURGERY     COLONOSCOPY N/A 08/04/2015   Procedure: COLONOSCOPY;  Surgeon: Jerene Bears, MD;  Location: [facility] ENDOSCOPY;  Service: Endoscopy;  Laterality: N/A;   COLONOSCOPY  2017   JMP@ [facility]-good prep-mass -recall 1 yr   COLONOSCOPY WITH PROPOFOL N/A 11/25/2020   Procedure: COLONOSCOPY WITH PROPOFOL;  Surgeon: Sharyn Creamer, MD;  Location: [place];  Service: Gastroenterology;  Laterality: N/A;   ESOPHAGOGASTRODUODENOSCOPY N/A 08/01/2019   Procedure: ESOPHAGOGASTRODUODENOSCOPY (EGD);  Surgeon: Juanita Craver, MD;  Location: [facility] ENDOSCOPY;  Service: Endoscopy;  Laterality: N/A;   ESOPHAGOGASTRODUODENOSCOPY N/A 11/18/2020   Procedure: ESOPHAGOGASTRODUODENOSCOPY (EGD);  Surgeon: Daryel November, MD;  Location: [place];  Service: Gastroenterology;  Laterality: N/A;   ESOPHAGOGASTRODUODENOSCOPY (EGD) WITH PROPOFOL N/A 08/04/2019   Procedure: ESOPHAGOGASTRODUODENOSCOPY (EGD) WITH PROPOFOL;  Surgeon: Yetta Flock, MD;  Location: [facility];  Service: Gastroenterology;  Laterality: N/A;  graft left arm Left    for dialysis x 2. Removed   HOT HEMOSTASIS  11/18/2020   Procedure: HOT HEMOSTASIS (ARGON PLASMA COAGULATION/BICAP);  Surgeon: Daryel November, MD;  Location: [place];  Service: Gastroenterology;;   INSERTION OF DIALYSIS CATHETER     Rt chest   LAPAROSCOPIC RIGHT COLECTOMY N/A 08/05/2015   Procedure: LAPAROSCOPIC RIGHT COLECTOMY- ASCENDING;  Surgeon: Stark Klein, MD;  Location: [place] [facility];  Service: General;  Laterality: N/A;   MASS EXCISION Left 05/28/2019   Procedure: EXCISION SOFT TISSUE MASS LEFT SHOULDER;  Surgeon: Armandina Gemma, MD;  Location: [facility];  Service: General;  Laterality: Left;   NEPHRECTOMY  Bilateral    PARATHYROIDECTOMY N/A 06/12/2016   Procedure: TOTAL PARATHYROIDECTOMY WITH AUTOTRANSPLANTATION TO LEFT FOREARM;  Surgeon: Armandina Gemma, MD;  Location: [place];  Service: General;  Laterality: N/A;   PARATHYROIDECTOMY N/A 10/23/2016   Procedure: PARATHYROIDECTOMY;  Surgeon: Armandina Gemma, MD;  Location: [place];  Service: General;  Laterality: N/A;   REVERSE SHOULDER ARTHROPLASTY Right 08/24/2020   Procedure: REVERSE SHOULDER ARTHROPLASTY;  Surgeon: Hiram Gash, MD;  Location: [place];  Service: Orthopedics;  Laterality: Right;   REVISON OF ARTERIOVENOUS FISTULA Right 07/16/2017   Procedure: REVISION OF ARTERIOVENOUS FISTULA  Right ARM;  Surgeon: Waynetta Sandy, MD;  Location: [place];  Service: Vascular;  Laterality: Right;   TOTAL HIP ARTHROPLASTY Left 11/14/2020   Procedure: LEFT TOTAL HIP ARTHROPLASTY ANTERIOR APPROACH;  Surgeon: Leandrew Koyanagi, MD;  Location: [place];  Service: Orthopedics;  Laterality: Left;   UPPER GASTROINTESTINAL ENDOSCOPY  2021   @ [facility]    There were no vitals filed for this visit.                      [facility] Adult PT Treatment/Exercise - 03/13/21 0001       Knee/Hip Exercises: Aerobic   Nustep L3-4 x 10 min with PTA discussing current status, what pt does at home for HEP      Knee/Hip Exercises: Machines for Strengthening   Cybex Leg Press Seat 9 Bil 85# 2x15, 45# Single leg press Bil sides x15, then 45# 10x on second set   Current resistance challenging     Knee/Hip Exercises: Standing   Lateral Step Up Both;1 set;15 reps;Hand Hold: 2;Step Height: 6"    Forward Step Up Both;1 set;15 reps;Hand Hold: 2;Step Height: 6"      Shoulder Exercises: Supine   Horizontal ABduction Strengthening;Both;20 reps    Theraband Level (Shoulder Horizontal ABduction) Level 1 (Yellow)    Other Supine Exercises RTUE punch 5# 10x 2   Vc to not roll shoulders   Other Supine Exercises Supine bench press with cane + 4# wt 2x15      Shoulder Exercises:  Sidelying   External Rotation AROM;Right;20 reps    External Rotation Limitations Gentle assistance from PTA to initially figure out motion      Shoulder Exercises: Standing   Row --   2x20 Blue band     Shoulder Exercises: ROM/Strengthening   Sustained Retraction with Theraband wall sidles using wash cloth on Rt hand x 10                       PT Short Term Goals - 03/02/21 4825       PT SHORT TERM GOAL #1   Title Pt will be independent with initial HEP.    Status Achieved  PT Long Term Goals - 03/02/21 0948       PT LONG TERM GOAL #4   Title Pt able to negotiate steps with reciprocal pattern with use of unilateral rail to  allow him to do laundry in his home.    Baseline 50% of the time    Status On-going                   Plan - 03/13/21 1106     Clinical Impression Statement Pt arrives with no new complaints. Resistance on leg press remains challenging so we did not increase today. Pt reports he is able to perform his work duties to his satisfaction. Pain in Rt shoulder is "50-50" some days pt reports no pain but it is very dependent on what he is doing. Reaching overhead extremely difficult. Pt has a lot of compensatory movements lifting arm overhead that he does not exhibit when laying supine.    Personal Factors and Comorbidities Fitness;Comorbidity 3+    Comorbidities L THA, R shoulder surgery, ESRD on hemodialysis    Examination-Activity Limitations Locomotion Level;Reach Overhead;Squat;Stairs    Examination-Participation Restrictions Community Activity;Yard Work    Merchant navy officer Evolving/Moderate complexity    Rehab Potential Good    PT Frequency 2x / week    PT Duration 8 weeks    PT Treatment/Interventions ADLs/Self Care Home Management;Aquatic Therapy;Cryotherapy;Electrical Stimulation;Iontophoresis 4mg /ml Dexamethasone;Moist Heat;Gait training;Stair training;Functional mobility training;Therapeutic  activities;Therapeutic exercise;Balance training;Neuromuscular re-education;Patient/family education;Manual techniques;Passive range of motion;Dry needling;Taping;Vasopneumatic Device;Joint Manipulations    PT Next Visit Plan Measure hip strength and shoulder AROM for LTGs    PT Home Exercise Plan Access Code: [email or web address]    Consulted and Agree with Plan of Care Patient             Patient will benefit from skilled therapeutic intervention in order to improve the following deficits and impairments:  Decreased range of motion, Difficulty walking, Increased muscle spasms, Impaired UE functional use, Pain, Impaired flexibility, Decreased strength  Visit Diagnosis: Muscle weakness (generalized)  Other abnormalities of gait and mobility  Stiffness of right shoulder, not elsewhere classified  Chronic pain of left knee  Chronic pain of right knee  Stiffness of left hip, not elsewhere classified  Pain in left hip     Problem List Patient Active Problem List   Diagnosis Date Noted   Mucosal abnormality of esophagus    Mucosal abnormality of stomach    Mucosal abnormality of duodenum    Upper GI bleed    Left hip pain 11/16/2020   Type 2 diabetes mellitus with other specified complication ([place]) 93/81/8299   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/14/2020   Primary osteoarthritis of left hip 10/07/2020   Status post reverse total arthroplasty of right shoulder 08/24/2020   Diverticulitis 03/08/2020   ESRD on hemodialysis ([place]) 03/08/2020   Polyneuropathy in diseases classified elsewhere ([place]) 03/08/2020   Pure hypercholesterolemia 03/08/2020   Sacral back pain 03/08/2020   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 11/10/2019   Acute gastric ulcer with hemorrhage    Hypotension of hemodialysis 07/31/2019   Diverticulitis large intestine 07/31/2019   Mass of soft tissue of shoulder 05/26/2019   Awaiting organ transplant status 03/18/2019   Finding of cocaine in blood 02/25/2019    Anaphylactic shock, unspecified, sequela 11/21/2018   Hypercalcemia 10/28/2018   Diarrhea, unspecified 08/08/2018   Hyperkalemia 03/12/2018   Patient is Jehovah's Witness 01/30/2018   Renal cell carcinoma of left kidney ([place]) 12/24/2017   Fluid  overload, unspecified 07/06/2017   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   Other specified disorders of kidney and ureter 11/22/2016   Secondary hyperparathyroidism of renal origin ([place]) 10/23/2016   Other headache syndrome 08/21/2016   TIA (transient ischemic attack)    Essential hypertension    Seizures ([place])    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    Lung nodule    Diverticulitis of cecum 07/21/2015   Diverticulitis of large intestine without perforation or abscess without bleeding    Right lower quadrant pain    Obstructive sleep apnea 02/08/2015   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   Diverticulosis 07/13/2014   Lower GI bleed 07/12/2014   Refusal of blood transfusions as patient is Jehovah's Witness    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   HLD (hyperlipidemia)    PRES (posterior reversible encephalopathy syndrome)    History of lacunar cerebrovascular accident (CVA) 05/23/2014   Anemia of renal disease 05/23/2014   GERD (gastroesophageal reflux disease) 12/09/2012   Gastrointestinal bleeding 11/12/2012   Melena 11/12/2012   Other psychoactive substance dependence, uncomplicated ([place]) 24/46/2863   Iron deficiency anemia, unspecified 06/10/2012   Moderate protein-calorie malnutrition ([place]) 07/29/2009   Coagulation defect, unspecified ([place]) 08/29/2006   Type 2 diabetes mellitus with diabetic peripheral angiopathy without gangrene ([place]) 08/29/2006   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 stage renal disease ([place]) 08/02/2006    [name], PTA 03/13/2021, 11:45 AM  [facility] @ [address] [place], [place], [address] Phone: [phone]   Fax:  [phone]  Name: Carl Cansler Sr. MRN: [id number] Date of Birth: 21-Nov-1960

## 2021-03-14 DIAGNOSIS — N186 End stage renal disease: Secondary | ICD-10-CM | POA: Diagnosis not present

## 2021-03-14 DIAGNOSIS — I12 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 stage renal disease: Secondary | ICD-10-CM | POA: Diagnosis not present

## 2021-03-14 DIAGNOSIS — Z992 Dependence on renal dialysis: Secondary | ICD-10-CM | POA: Diagnosis not present

## 2021-03-15 DIAGNOSIS — D509 Iron deficiency anemia, unspecified: Secondary | ICD-10-CM | POA: Diagnosis not present

## 2021-03-15 DIAGNOSIS — D631 Anemia in chronic kidney disease: Secondary | ICD-10-CM | POA: Diagnosis not present

## 2021-03-15 DIAGNOSIS — N186 End stage renal disease: Secondary | ICD-10-CM | POA: Diagnosis not present

## 2021-03-15 DIAGNOSIS — Z992 Dependence on renal dialysis: Secondary | ICD-10-CM | POA: Diagnosis not present

## 2021-03-15 DIAGNOSIS — Z7682 Awaiting organ transplant status: Secondary | ICD-10-CM | POA: Diagnosis not present

## 2021-03-15 DIAGNOSIS — N2581 Secondary hyperparathyroidism of renal origin: Secondary | ICD-10-CM | POA: Diagnosis not present

## 2021-03-15 DIAGNOSIS — R197 Diarrhea, unspecified: Secondary | ICD-10-CM | POA: Diagnosis not present

## 2021-03-15 DIAGNOSIS — E875 Hyperkalemia: Secondary | ICD-10-CM | POA: Diagnosis not present

## 2021-03-15 DIAGNOSIS — D689 Coagulation defect, unspecified: Secondary | ICD-10-CM | POA: Diagnosis not present

## 2021-03-16 ENCOUNTER — Encounter: Payer: Self-pay | Admitting: Rehabilitative and Restorative Service Providers"

## 2021-03-16 ENCOUNTER — Ambulatory Visit: Payer: Medicare Other | Attending: Family Medicine | Admitting: Rehabilitative and Restorative Service Providers"

## 2021-03-16 ENCOUNTER — Other Ambulatory Visit: Payer: Self-pay

## 2021-03-16 DIAGNOSIS — R2689 Other abnormalities of gait and mobility: Secondary | ICD-10-CM | POA: Diagnosis not present

## 2021-03-16 DIAGNOSIS — M25562 Pain in left knee: Secondary | ICD-10-CM | POA: Diagnosis not present

## 2021-03-16 DIAGNOSIS — M25611 Stiffness of right shoulder, not elsewhere classified: Secondary | ICD-10-CM | POA: Insufficient documentation

## 2021-03-16 DIAGNOSIS — M25561 Pain in right knee: Secondary | ICD-10-CM | POA: Insufficient documentation

## 2021-03-16 DIAGNOSIS — G8929 Other chronic pain: Secondary | ICD-10-CM | POA: Insufficient documentation

## 2021-03-16 DIAGNOSIS — M6281 Muscle weakness (generalized): Secondary | ICD-10-CM | POA: Insufficient documentation

## 2021-03-16 DIAGNOSIS — M25552 Pain in left hip: Secondary | ICD-10-CM | POA: Insufficient documentation

## 2021-03-16 DIAGNOSIS — M25652 Stiffness of left hip, not elsewhere classified: Secondary | ICD-10-CM | POA: Diagnosis not present

## 2021-03-16 NOTE — Therapy (Signed)
[facility] @ [address] [place], [place], [address] Phone: [phone]   Fax:  [phone]  Physical Therapy Treatment  Patient Details  Name: Carl Rembold Sr. MRN: [id number] Date of Birth: 05-09-1960 Referring Provider (PT): Dr Lauretta Grill Dorthy Cooler   Encounter Date: 03/16/2021  Progress Note Reporting Period 02/14/2021 to 03/16/2021  See note below for Objective Data and Assessment of Progress/Goals.       PT End of Session - 03/16/21 0816     Visit Number 10    Date for PT Re-Evaluation 04/07/21    Authorization Type [employer] Medicare    Progress Note Due on Visit 57    PT Start Time 0800    PT Stop Time 0840    PT Time Calculation (min) 40 min    Activity Tolerance Patient tolerated treatment well    Behavior During Therapy WFL for tasks assessed/performed             Past Medical History:  Diagnosis Date   Acute gastric ulcer with hemorrhage    Acute GI bleeding 07/31/2019   Acute pericarditis    Anemia    hx of   Anxiety    situational    Arthritis    on meds   Borderline diabetes    Cataract    bilateral sx   Chronic headaches    Depression    situational    Diverticulitis    ESRD (end stage renal disease) ([place])     On Renal Transplant List," [facility]; MWF" (10/23/2016)   GERD (gastroesophageal reflux disease)    with certain foods   GI bleed    Hypertension    diet controlled   Parathyroid abnormality (HCC)    ectopic parathyroid gland   Presence of arteriovenous fistula for hemodialysis, primary ([place])    RUE   Refusal of blood product    NO WHOLE BLOOD PROUCTS   Renal cell carcinoma ([place])    s/p hand assisted laparoscopic bilateral nephrectomies 11/29/17, + RCC left   Secondary hyperparathyroidism ([place])    Seizures ([place])    one episode in past, due to" elevated Potassium" 08/02/20- "at least 4 years ago"   Sleep apnea    doesn't use CPAP anymore since weight loss   Stroke [facility])    no  residual    Past Surgical History:  Procedure Laterality Date   AV FISTULA PLACEMENT Right    right arm   BIOPSY  08/01/2019   Procedure: BIOPSY;  Surgeon: Juanita Craver, MD;  Location: [facility] ENDOSCOPY;  Service: Endoscopy;;   BIOPSY  11/18/2020   Procedure: BIOPSY;  Surgeon: Daryel November, MD;  Location: [facility] ENDOSCOPY;  Service: Gastroenterology;;   CATARACT EXTRACTION W/ INTRAOCULAR LENS  IMPLANT, BILATERAL     COLON SURGERY     COLONOSCOPY N/A 08/04/2015   Procedure: COLONOSCOPY;  Surgeon: Jerene Bears, MD;  Location: [facility] ENDOSCOPY;  Service: Endoscopy;  Laterality: N/A;   COLONOSCOPY  2017   JMP@ [facility]-good prep-mass -recall 1 yr   COLONOSCOPY WITH PROPOFOL N/A 11/25/2020   Procedure: COLONOSCOPY WITH PROPOFOL;  Surgeon: Sharyn Creamer, MD;  Location: [place];  Service: Gastroenterology;  Laterality: N/A;   ESOPHAGOGASTRODUODENOSCOPY N/A 08/01/2019   Procedure: ESOPHAGOGASTRODUODENOSCOPY (EGD);  Surgeon: Juanita Craver, MD;  Location: [facility] ENDOSCOPY;  Service: Endoscopy;  Laterality: N/A;   ESOPHAGOGASTRODUODENOSCOPY N/A 11/18/2020   Procedure: ESOPHAGOGASTRODUODENOSCOPY (EGD);  Surgeon: Daryel November, MD;  Location: [place];  Service: Gastroenterology;  Laterality: N/A;  ESOPHAGOGASTRODUODENOSCOPY (EGD) WITH PROPOFOL N/A 08/04/2019   Procedure: ESOPHAGOGASTRODUODENOSCOPY (EGD) WITH PROPOFOL;  Surgeon: Yetta Flock, MD;  Location: [facility];  Service: Gastroenterology;  Laterality: N/A;   graft left arm Left    for dialysis x 2. Removed   HOT HEMOSTASIS  11/18/2020   Procedure: HOT HEMOSTASIS (ARGON PLASMA COAGULATION/BICAP);  Surgeon: Daryel November, MD;  Location: [facility];  Service: Gastroenterology;;   INSERTION OF DIALYSIS CATHETER     Rt chest   LAPAROSCOPIC RIGHT COLECTOMY N/A 08/05/2015   Procedure: LAPAROSCOPIC RIGHT COLECTOMY- ASCENDING;  Surgeon: Stark Klein, MD;  Location: [place];  Service: General;  Laterality: N/A;   MASS EXCISION Left  05/28/2019   Procedure: EXCISION SOFT TISSUE MASS LEFT SHOULDER;  Surgeon: Armandina Gemma, MD;  Location: [facility];  Service: General;  Laterality: Left;   NEPHRECTOMY Bilateral    PARATHYROIDECTOMY N/A 06/12/2016   Procedure: TOTAL PARATHYROIDECTOMY WITH AUTOTRANSPLANTATION TO LEFT FOREARM;  Surgeon: Armandina Gemma, MD;  Location: [facility];  Service: General;  Laterality: N/A;   PARATHYROIDECTOMY N/A 10/23/2016   Procedure: PARATHYROIDECTOMY;  Surgeon: Armandina Gemma, MD;  Location: [place];  Service: General;  Laterality: N/A;   REVERSE SHOULDER ARTHROPLASTY Right 08/24/2020   Procedure: REVERSE SHOULDER ARTHROPLASTY;  Surgeon: Hiram Gash, MD;  Location: [facility];  Service: Orthopedics;  Laterality: Right;   REVISON OF ARTERIOVENOUS FISTULA Right 07/16/2017   Procedure: REVISION OF ARTERIOVENOUS FISTULA  Right ARM;  Surgeon: Waynetta Sandy, MD;  Location: [place];  Service: Vascular;  Laterality: Right;   TOTAL HIP ARTHROPLASTY Left 11/14/2020   Procedure: LEFT TOTAL HIP ARTHROPLASTY ANTERIOR APPROACH;  Surgeon: Leandrew Koyanagi, MD;  Location: [place];  Service: Orthopedics;  Laterality: Left;   UPPER GASTROINTESTINAL ENDOSCOPY  2021   @ [facility]    There were no vitals filed for this visit.   Subjective Assessment - 03/16/21 0817     Subjective "I had a little tingling in my hip where the surgery was, I guess because of the rain"    Pertinent History L THA in August 2022, R shoulder rotator cuff repair March 2022.    Patient Stated Goals Less pain when doing activities and increased strength.    Currently in Pain? Yes    Pain Score 2     Pain Location Hip    Pain Orientation Left    Pain Descriptors / Indicators Tingling    Pain Type Chronic pain                [facility] PT Assessment - 03/16/21 0001       Assessment   Medical Diagnosis M25.561 (ICD-10-CM) - Pain in right knee  R29.898 (ICD-10-CM) - Other symptoms and signs involving the musculoskeletal system    Referring Provider (PT) Dr  Lauretta Grill [place]    Hand Dominance Right      Prior Function   Level of Independence Independent    Vocation On disability    Vocation Requirements former [employer], shooting pool, riding bike, fishing      AROM   AROM Assessment Site Shoulder    Right/Left Shoulder Right    Right Shoulder Flexion 90 Degrees    Right Shoulder ABduction 77 Degrees      Strength   Overall Strength Comments R shoulder strength of 3/5, B hips and knee strength of 4-/5  [place] Adult PT Treatment/Exercise - 03/16/21 0001       Exercises   Exercises Shoulder;Knee/Hip      Knee/Hip Exercises: Aerobic   Nustep L4 x 10 min with PT discussing current status, what pt does at home for HEP      Knee/Hip Exercises: Seated   Sit to Sand 2 sets;10 reps;with UE support   sitting on one pad, holding yellow ball x10 with chest press, x10 with overhead press.     Knee/Hip Exercises: Supine   Bridges Strengthening;Both;2 sets;10 reps    Straight Leg Raises Strengthening;Left;2 sets;10 reps      Shoulder Exercises: Supine   Horizontal ABduction Strengthening;Both;20 reps    Theraband Level (Shoulder Horizontal ABduction) Level 1 (Yellow)    Flexion AAROM;Strengthening;Both;20 reps    Shoulder Flexion Weight (lbs) 5   weight on cane for AAROM.   Other Supine Exercises Supine bench press with cane + 5# wt 2x15                       PT Short Term Goals - 03/16/21 0818       PT SHORT TERM GOAL #1   Title Pt will be independent with initial HEP.    Status Achieved               PT Long Term Goals - 03/16/21 0818       PT LONG TERM GOAL #1   Title be independent in advanced HEP    Status On-going      PT LONG TERM GOAL #4   Title Pt able to negotiate steps with reciprocal pattern with use of unilateral rail to  allow him to do laundry in his home.    Status On-going      PT LONG TERM GOAL #5   Title Increase FOTO to at least  63% to allow pt to perform activities with decreased complication.    Status On-going                   Plan - 03/16/21 0905     Clinical Impression Statement Mr Griswold continues to report no new complaints. Pt has had some increased in his R shoulder AROM for abduction. Pt with some difficulty with LLE straight leg raise. Pt continues to progress with strengthening and is able to tolerate increased weights during ther ex. Pt continues to require skilled PT to progress towards goal related activities.    Personal Factors and Comorbidities Fitness;Comorbidity 3+    Comorbidities L THA, R shoulder surgery, ESRD on hemodialysis    PT Treatment/Interventions ADLs/Self Care Home Management;Aquatic Therapy;Cryotherapy;Electrical Stimulation;Iontophoresis 4mg /ml Dexamethasone;Moist Heat;Gait training;Stair training;Functional mobility training;Therapeutic activities;Therapeutic exercise;Balance training;Neuromuscular re-education;Patient/family education;Manual techniques;Passive range of motion;Dry needling;Taping;Vasopneumatic Device;Joint Manipulations    PT Next Visit Plan FOTO, strengthening, ROM.    Consulted and Agree with Plan of Care Patient             Patient will benefit from skilled therapeutic intervention in order to improve the following deficits and impairments:  Decreased range of motion, Difficulty walking, Increased muscle spasms, Impaired UE functional use, Pain, Impaired flexibility, Decreased strength  Visit Diagnosis: Muscle weakness (generalized)  Other abnormalities of gait and mobility  Stiffness of right shoulder, not elsewhere classified  Chronic pain of left knee  Chronic pain of right knee  Stiffness of left hip, not elsewhere classified  Pain in left hip     Problem List Patient Active Problem List  Diagnosis Date Noted   Mucosal abnormality of esophagus    Mucosal abnormality of stomach    Mucosal abnormality of duodenum    Upper GI  bleed    Left hip pain 11/16/2020   Type 2 diabetes mellitus with other specified complication ([place]) 16/11/9602   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/14/2020   Primary osteoarthritis of left hip 10/07/2020   Status post reverse total arthroplasty of right shoulder 08/24/2020   Diverticulitis 03/08/2020   ESRD on hemodialysis ([place]) 03/08/2020   Polyneuropathy in diseases classified elsewhere ([place]) 03/08/2020   Pure hypercholesterolemia 03/08/2020   Sacral back pain 03/08/2020   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 11/10/2019   Acute gastric ulcer with hemorrhage    Hypotension of hemodialysis 07/31/2019   Diverticulitis large intestine 07/31/2019   Mass of soft tissue of shoulder 05/26/2019   Awaiting organ transplant status 03/18/2019   Finding of cocaine in blood 02/25/2019   Anaphylactic shock, unspecified, sequela 11/21/2018   Hypercalcemia 10/28/2018   Diarrhea, unspecified 08/08/2018   Hyperkalemia 03/12/2018   Patient is Jehovah's Witness 01/30/2018   Renal cell carcinoma of left kidney ([place]) 12/24/2017   Fluid overload, unspecified 07/06/2017   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   Other specified disorders of kidney and ureter 11/22/2016   Secondary hyperparathyroidism of renal origin ([place]) 10/23/2016   Other headache syndrome 08/21/2016   TIA (transient ischemic attack)    Essential hypertension    Seizures ([place])    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    Lung nodule    Diverticulitis of cecum 07/21/2015   Diverticulitis of large intestine without perforation or abscess without bleeding    Right lower quadrant pain    Obstructive sleep apnea 02/08/2015   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   Diverticulosis 07/13/2014   Lower GI bleed 07/12/2014   Refusal of blood transfusions as patient is Jehovah's Witness    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   HLD (hyperlipidemia)     PRES (posterior reversible encephalopathy syndrome)    History of lacunar cerebrovascular accident (CVA) 05/23/2014   Anemia of renal disease 05/23/2014   GERD (gastroesophageal reflux disease) 12/09/2012   Gastrointestinal bleeding 11/12/2012   Melena 11/12/2012   Other psychoactive substance dependence, uncomplicated ([place]) 54/10/8117   Iron deficiency anemia, unspecified 06/10/2012   Moderate protein-calorie malnutrition ([place]) 07/29/2009   Coagulation defect, unspecified ([place]) 08/29/2006   Type 2 diabetes mellitus with diabetic peripheral angiopathy without gangrene ([place]) 08/29/2006   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 stage renal disease ([place]) 08/02/2006    Juel Burrow, PT 03/16/2021, 9:21 AM  [facility] @ [address] [place], [place], [address] Phone: [phone]   Fax:  [phone]  Name: Carl Butt Sr. MRN: [id number] Date of Birth: January 07, 1961

## 2021-03-17 DIAGNOSIS — E875 Hyperkalemia: Secondary | ICD-10-CM | POA: Diagnosis not present

## 2021-03-17 DIAGNOSIS — R197 Diarrhea, unspecified: Secondary | ICD-10-CM | POA: Diagnosis not present

## 2021-03-17 DIAGNOSIS — D689 Coagulation defect, unspecified: Secondary | ICD-10-CM | POA: Diagnosis not present

## 2021-03-17 DIAGNOSIS — N2581 Secondary hyperparathyroidism of renal origin: Secondary | ICD-10-CM | POA: Diagnosis not present

## 2021-03-17 DIAGNOSIS — Z7682 Awaiting organ transplant status: Secondary | ICD-10-CM | POA: Diagnosis not present

## 2021-03-17 DIAGNOSIS — D509 Iron deficiency anemia, unspecified: Secondary | ICD-10-CM | POA: Diagnosis not present

## 2021-03-17 DIAGNOSIS — Z992 Dependence on renal dialysis: Secondary | ICD-10-CM | POA: Diagnosis not present

## 2021-03-17 DIAGNOSIS — N186 End stage renal disease: Secondary | ICD-10-CM | POA: Diagnosis not present

## 2021-03-17 DIAGNOSIS — D631 Anemia in chronic kidney disease: Secondary | ICD-10-CM | POA: Diagnosis not present

## 2021-03-20 ENCOUNTER — Other Ambulatory Visit: Payer: Self-pay

## 2021-03-20 ENCOUNTER — Ambulatory Visit: Payer: Medicare Other

## 2021-03-20 DIAGNOSIS — R2689 Other abnormalities of gait and mobility: Secondary | ICD-10-CM | POA: Diagnosis not present

## 2021-03-20 DIAGNOSIS — M25611 Stiffness of right shoulder, not elsewhere classified: Secondary | ICD-10-CM

## 2021-03-20 DIAGNOSIS — M25561 Pain in right knee: Secondary | ICD-10-CM | POA: Diagnosis not present

## 2021-03-20 DIAGNOSIS — Z992 Dependence on renal dialysis: Secondary | ICD-10-CM | POA: Diagnosis not present

## 2021-03-20 DIAGNOSIS — Z7682 Awaiting organ transplant status: Secondary | ICD-10-CM | POA: Diagnosis not present

## 2021-03-20 DIAGNOSIS — M6281 Muscle weakness (generalized): Secondary | ICD-10-CM

## 2021-03-20 DIAGNOSIS — G8929 Other chronic pain: Secondary | ICD-10-CM | POA: Diagnosis not present

## 2021-03-20 DIAGNOSIS — M25552 Pain in left hip: Secondary | ICD-10-CM | POA: Diagnosis not present

## 2021-03-20 DIAGNOSIS — D509 Iron deficiency anemia, unspecified: Secondary | ICD-10-CM | POA: Diagnosis not present

## 2021-03-20 DIAGNOSIS — D631 Anemia in chronic kidney disease: Secondary | ICD-10-CM | POA: Diagnosis not present

## 2021-03-20 DIAGNOSIS — M25652 Stiffness of left hip, not elsewhere classified: Secondary | ICD-10-CM | POA: Diagnosis not present

## 2021-03-20 DIAGNOSIS — R197 Diarrhea, unspecified: Secondary | ICD-10-CM | POA: Diagnosis not present

## 2021-03-20 DIAGNOSIS — N2581 Secondary hyperparathyroidism of renal origin: Secondary | ICD-10-CM | POA: Diagnosis not present

## 2021-03-20 DIAGNOSIS — N186 End stage renal disease: Secondary | ICD-10-CM | POA: Diagnosis not present

## 2021-03-20 DIAGNOSIS — D689 Coagulation defect, unspecified: Secondary | ICD-10-CM | POA: Diagnosis not present

## 2021-03-20 DIAGNOSIS — M25562 Pain in left knee: Secondary | ICD-10-CM | POA: Diagnosis not present

## 2021-03-20 DIAGNOSIS — E875 Hyperkalemia: Secondary | ICD-10-CM | POA: Diagnosis not present

## 2021-03-20 NOTE — Therapy (Signed)
[facility] @ [place], [place], [address] Phone: [phone]   Fax:  [phone]  Physical Therapy Treatment  Patient Details  Name: Carl Seabrook Sr. MRN: [id number] Date of Birth: 06-Apr-1960 Referring Provider (PT): Dr [name]   Encounter Date: 03/20/2021   PT End of Session - 03/20/21 1223     Visit Number 11    Date for PT Re-Evaluation 04/07/21    Authorization Type [employer] Medicare    Progress Note Due on Visit 43    PT Start Time 1145    PT Stop Time 1227    PT Time Calculation (min) 42 min    Activity Tolerance Patient tolerated treatment well    Behavior During Therapy WFL for tasks assessed/performed             Past Medical History:  Diagnosis Date   Acute gastric ulcer with hemorrhage    Acute GI bleeding 07/31/2019   Acute pericarditis    Anemia    hx of   Anxiety    situational    Arthritis    on meds   Borderline diabetes    Cataract    bilateral sx   Chronic headaches    Depression    situational    Diverticulitis    ESRD (end stage renal disease) ([place])     On Renal Transplant List," [facility]; MWF" (10/23/2016)   GERD (gastroesophageal reflux disease)    with certain foods   GI bleed    Hypertension    diet controlled   Parathyroid abnormality (HCC)    ectopic parathyroid gland   Presence of arteriovenous fistula for hemodialysis, primary ([place])    RUE   Refusal of blood product    NO WHOLE BLOOD PROUCTS   Renal cell carcinoma ([place])    s/p hand assisted laparoscopic bilateral nephrectomies 11/29/17, + RCC left   Secondary hyperparathyroidism ([place])    Seizures ([place])    one episode in past, due to" elevated Potassium" 08/02/20- "at least 4 years ago"   Sleep apnea    doesn't use CPAP anymore since weight loss   Stroke [facility])    no residual    Past Surgical History:  Procedure Laterality Date   AV FISTULA PLACEMENT Right    right arm   BIOPSY  08/01/2019    Procedure: BIOPSY;  Surgeon: Juanita Craver, MD;  Location: [facility] ENDOSCOPY;  Service: Endoscopy;;   BIOPSY  11/18/2020   Procedure: BIOPSY;  Surgeon: Daryel November, MD;  Location: [facility] ENDOSCOPY;  Service: Gastroenterology;;   CATARACT EXTRACTION W/ INTRAOCULAR LENS  IMPLANT, BILATERAL     COLON SURGERY     COLONOSCOPY N/A 08/04/2015   Procedure: COLONOSCOPY;  Surgeon: Jerene Bears, MD;  Location: [facility] ENDOSCOPY;  Service: Endoscopy;  Laterality: N/A;   COLONOSCOPY  2017   JMP@ [facility]-good prep-mass -recall 1 yr   COLONOSCOPY WITH PROPOFOL N/A 11/25/2020   Procedure: COLONOSCOPY WITH PROPOFOL;  Surgeon: Sharyn Creamer, MD;  Location: [place];  Service: Gastroenterology;  Laterality: N/A;   ESOPHAGOGASTRODUODENOSCOPY N/A 08/01/2019   Procedure: ESOPHAGOGASTRODUODENOSCOPY (EGD);  Surgeon: Juanita Craver, MD;  Location: [facility] ENDOSCOPY;  Service: Endoscopy;  Laterality: N/A;   ESOPHAGOGASTRODUODENOSCOPY N/A 11/18/2020   Procedure: ESOPHAGOGASTRODUODENOSCOPY (EGD);  Surgeon: Daryel November, MD;  Location: [place];  Service: Gastroenterology;  Laterality: N/A;   ESOPHAGOGASTRODUODENOSCOPY (EGD) WITH PROPOFOL N/A 08/04/2019   Procedure: ESOPHAGOGASTRODUODENOSCOPY (EGD) WITH PROPOFOL;  Surgeon: Yetta Flock, MD;  Location:  [place] ENDOSCOPY;  Service: Gastroenterology;  Laterality: N/A;   graft left arm Left    for dialysis x 2. Removed   HOT HEMOSTASIS  11/18/2020   Procedure: HOT HEMOSTASIS (ARGON PLASMA COAGULATION/BICAP);  Surgeon: Daryel November, MD;  Location: [place];  Service: Gastroenterology;;   INSERTION OF DIALYSIS CATHETER     Rt chest   LAPAROSCOPIC RIGHT COLECTOMY N/A 08/05/2015   Procedure: LAPAROSCOPIC RIGHT COLECTOMY- ASCENDING;  Surgeon: Stark Klein, MD;  Location: [place];  Service: General;  Laterality: N/A;   MASS EXCISION Left 05/28/2019   Procedure: EXCISION SOFT TISSUE MASS LEFT SHOULDER;  Surgeon: Armandina Gemma, MD;  Location: [facility];  Service: General;   Laterality: Left;   NEPHRECTOMY Bilateral    PARATHYROIDECTOMY N/A 06/12/2016   Procedure: TOTAL PARATHYROIDECTOMY WITH AUTOTRANSPLANTATION TO LEFT FOREARM;  Surgeon: Armandina Gemma, MD;  Location: [place];  Service: General;  Laterality: N/A;   PARATHYROIDECTOMY N/A 10/23/2016   Procedure: PARATHYROIDECTOMY;  Surgeon: Armandina Gemma, MD;  Location: [place];  Service: General;  Laterality: N/A;   REVERSE SHOULDER ARTHROPLASTY Right 08/24/2020   Procedure: REVERSE SHOULDER ARTHROPLASTY;  Surgeon: Hiram Gash, MD;  Location: [place];  Service: Orthopedics;  Laterality: Right;   REVISON OF ARTERIOVENOUS FISTULA Right 07/16/2017   Procedure: REVISION OF ARTERIOVENOUS FISTULA  Right ARM;  Surgeon: Waynetta Sandy, MD;  Location: [place];  Service: Vascular;  Laterality: Right;   TOTAL HIP ARTHROPLASTY Left 11/14/2020   Procedure: LEFT TOTAL HIP ARTHROPLASTY ANTERIOR APPROACH;  Surgeon: Leandrew Koyanagi, MD;  Location: [place];  Service: Orthopedics;  Laterality: Left;   UPPER GASTROINTESTINAL ENDOSCOPY  2021   @ [facility]    There were no vitals filed for this visit.   Subjective Assessment - 03/20/21 1143     Subjective I have been doing well.    Patient Stated Goals Less pain when doing activities and increased strength.    Currently in Pain? No/denies                               [facility] Adult PT Treatment/Exercise - 03/20/21 0001       Knee/Hip Exercises: Aerobic   Nustep L4 x 10 min with PT discussing current status, what pt does at home for HEP      Knee/Hip Exercises: Standing   Hip Abduction Both;10 reps;Knee straight;1 set    Abduction Limitations on foam pad    Hip Extension Both;10 reps;Knee straight    Extension Limitations on foam pad    Forward Step Up 2 sets;10 reps;Step Height: 8";Hand Hold: 1      Shoulder Exercises: Seated   Flexion Strengthening;Both;20 reps;Weights    Flexion Weight (lbs) 3#-holding on each side      Shoulder Exercises: Standing    Other Standing Exercises wall slides: flexion with 1.5 added x10, scaption (no weight) x10      Shoulder Exercises: ROM/Strengthening   UBE (Upper Arm Bike) Level 1.5 x 6 min (3/3)-PT present to discuss progress                       PT Short Term Goals - 03/16/21 0818       PT SHORT TERM GOAL #1   Title Pt will be independent with initial HEP.    Status Achieved               PT Long Term Goals - 03/20/21  [place] #1   Title be independent in advanced HEP    Status On-going      PT LONG TERM GOAL #3   Title Pt to increase R shoulder flexion and abduction to at least 115 degrees to allow him to reach into overhead cabinets    Status On-going      PT LONG TERM GOAL #4   Title Pt able to negotiate steps with reciprocal pattern with use of unilateral rail to  allow him to do laundry in his home.    Baseline 50-75% of the time    Status On-going                   Plan - 03/20/21 1216     Clinical Impression Statement Pt continues to report improved strength and endurance. Pt is negotiating steps with step-over-step pattern 50-75% of the time due to improved strength. Pt did well with 8 step-ups today.  Pt with reduced eccentric control with stand to sit, especially at the end range. Pt with limited Rt shoulder scaption/abduction strength and was not able to do this with added weight. Pt continues to progress with strengthening and is able to tolerate increased weights during ther ex. Pt continues to require skilled PT to progress towards goal related activities.    PT Frequency 2x / week    PT Duration 8 weeks    PT Treatment/Interventions ADLs/Self Care Home Management;Aquatic Therapy;Cryotherapy;Electrical Stimulation;Iontophoresis 4mg /ml Dexamethasone;Moist Heat;Gait training;Stair training;Functional mobility training;Therapeutic activities;Therapeutic exercise;Balance training;Neuromuscular re-education;Patient/family  education;Manual techniques;Passive range of motion;Dry needling;Taping;Vasopneumatic Device;Joint Manipulations    PT Next Visit Plan hip and shoulder strength, work on sit to stand    PT Home Exercise Plan Access Code: [email or web address]    NIDPOEUMP and Agree with Plan of Care Patient             Patient will benefit from skilled therapeutic intervention in order to improve the following deficits and impairments:  Decreased range of motion, Difficulty walking, Increased muscle spasms, Impaired UE functional use, Pain, Impaired flexibility, Decreased strength  Visit Diagnosis: Muscle weakness (generalized)  Other abnormalities of gait and mobility  Stiffness of right shoulder, not elsewhere classified     Problem List Patient Active Problem List   Diagnosis Date Noted   Mucosal abnormality of esophagus    Mucosal abnormality of stomach    Mucosal abnormality of duodenum    Upper GI bleed    Left hip pain 11/16/2020   Type 2 diabetes mellitus with other specified complication ([place]) 53/61/4431   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/14/2020   Primary osteoarthritis of left hip 10/07/2020   Status post reverse total arthroplasty of right shoulder 08/24/2020   Diverticulitis 03/08/2020   ESRD on hemodialysis ([place]) 03/08/2020   Polyneuropathy in diseases classified elsewhere ([place]) 03/08/2020   Pure hypercholesterolemia 03/08/2020   Sacral back pain 03/08/2020   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 11/10/2019   Acute gastric ulcer with hemorrhage    Hypotension of hemodialysis 07/31/2019   Diverticulitis large intestine 07/31/2019   Mass of soft tissue of shoulder 05/26/2019   Awaiting organ transplant status 03/18/2019   Finding of cocaine in blood 02/25/2019   Anaphylactic shock, unspecified, sequela 11/21/2018   Hypercalcemia 10/28/2018   Diarrhea, unspecified 08/08/2018   Hyperkalemia 03/12/2018   Patient is Jehovah's Witness 01/30/2018   Renal cell carcinoma of left  kidney ([place]) 12/24/2017   Fluid overload, unspecified 07/06/2017  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   Other specified disorders of kidney and ureter 11/22/2016   Secondary hyperparathyroidism of renal origin ([place]) 10/23/2016   Other headache syndrome 08/21/2016   TIA (transient ischemic attack)    Essential hypertension    Seizures ([place])    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    Lung nodule    Diverticulitis of cecum 07/21/2015   Diverticulitis of large intestine without perforation or abscess without bleeding    Right lower quadrant pain    Obstructive sleep apnea 02/08/2015   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   Diverticulosis 07/13/2014   Lower GI bleed 07/12/2014   Refusal of blood transfusions as patient is Jehovah's Witness    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   HLD (hyperlipidemia)    PRES (posterior reversible encephalopathy syndrome)    History of lacunar cerebrovascular accident (CVA) 05/23/2014   Anemia of renal disease 05/23/2014   GERD (gastroesophageal reflux disease) 12/09/2012   Gastrointestinal bleeding 11/12/2012   Melena 11/12/2012   Other psychoactive substance dependence, uncomplicated ([place]) 94/70/9628   Iron deficiency anemia, unspecified 06/10/2012   Moderate protein-calorie malnutrition ([place]) 07/29/2009   Coagulation defect, unspecified ([place]) 08/29/2006   Type 2 diabetes mellitus with diabetic peripheral angiopathy without gangrene ([place]) 08/29/2006   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 stage renal disease ([place]) 08/02/2006    Sigurd Sos, PT 03/20/21 12:25 PM  [facility] @ [place], [place], [address] Phone: [phone]   Fax:  [phone]  Name: Carl Maniscalco Sr. MRN: [id number] Date of Birth: 25-Dec-1960

## 2021-03-22 ENCOUNTER — Ambulatory Visit: Payer: Medicare Other | Admitting: Rehabilitative and Restorative Service Providers"

## 2021-03-22 DIAGNOSIS — Z992 Dependence on renal dialysis: Secondary | ICD-10-CM | POA: Diagnosis not present

## 2021-03-22 DIAGNOSIS — E875 Hyperkalemia: Secondary | ICD-10-CM | POA: Diagnosis not present

## 2021-03-22 DIAGNOSIS — D631 Anemia in chronic kidney disease: Secondary | ICD-10-CM | POA: Diagnosis not present

## 2021-03-22 DIAGNOSIS — N2581 Secondary hyperparathyroidism of renal origin: Secondary | ICD-10-CM | POA: Diagnosis not present

## 2021-03-22 DIAGNOSIS — D689 Coagulation defect, unspecified: Secondary | ICD-10-CM | POA: Diagnosis not present

## 2021-03-22 DIAGNOSIS — D509 Iron deficiency anemia, unspecified: Secondary | ICD-10-CM | POA: Diagnosis not present

## 2021-03-22 DIAGNOSIS — R197 Diarrhea, unspecified: Secondary | ICD-10-CM | POA: Diagnosis not present

## 2021-03-22 DIAGNOSIS — N186 End stage renal disease: Secondary | ICD-10-CM | POA: Diagnosis not present

## 2021-03-22 DIAGNOSIS — Z7682 Awaiting organ transplant status: Secondary | ICD-10-CM | POA: Diagnosis not present

## 2021-03-23 ENCOUNTER — Other Ambulatory Visit: Payer: Self-pay

## 2021-03-23 ENCOUNTER — Ambulatory Visit: Payer: Medicare Other | Admitting: Rehabilitative and Restorative Service Providers"

## 2021-03-23 ENCOUNTER — Encounter: Payer: Self-pay | Admitting: Rehabilitative and Restorative Service Providers"

## 2021-03-23 DIAGNOSIS — M25552 Pain in left hip: Secondary | ICD-10-CM | POA: Diagnosis not present

## 2021-03-23 DIAGNOSIS — M25652 Stiffness of left hip, not elsewhere classified: Secondary | ICD-10-CM

## 2021-03-23 DIAGNOSIS — M25561 Pain in right knee: Secondary | ICD-10-CM | POA: Diagnosis not present

## 2021-03-23 DIAGNOSIS — G8929 Other chronic pain: Secondary | ICD-10-CM

## 2021-03-23 DIAGNOSIS — R2689 Other abnormalities of gait and mobility: Secondary | ICD-10-CM

## 2021-03-23 DIAGNOSIS — M25562 Pain in left knee: Secondary | ICD-10-CM | POA: Diagnosis not present

## 2021-03-23 DIAGNOSIS — M6281 Muscle weakness (generalized): Secondary | ICD-10-CM | POA: Diagnosis not present

## 2021-03-23 DIAGNOSIS — M25611 Stiffness of right shoulder, not elsewhere classified: Secondary | ICD-10-CM | POA: Diagnosis not present

## 2021-03-23 NOTE — Therapy (Signed)
[facility] @ [place], [place], [address] Phone: [phone]   Fax:  [phone]  Physical Therapy Treatment  Patient Details  Name: Carl Hamad Sr. MRN: [id number] Date of Birth: Jul 17, 1960 Referring Provider (PT): Dr [name]   Encounter Date: 03/23/2021   PT End of Session - 03/23/21 0856     Visit Number 12    Date for PT Re-Evaluation 04/07/21    Authorization Type [employer] Medicare    Progress Note Due on Visit 69    PT Start Time 0845    PT Stop Time 0925    PT Time Calculation (min) 40 min    Activity Tolerance Patient tolerated treatment well    Behavior During Therapy [facility] for tasks assessed/performed             Past Medical History:  Diagnosis Date   Acute gastric ulcer with hemorrhage    Acute GI bleeding 07/31/2019   Acute pericarditis    Anemia    hx of   Anxiety    situational    Arthritis    on meds   Borderline diabetes    Cataract    bilateral sx   Chronic headaches    Depression    situational    Diverticulitis    ESRD (end stage renal disease) ([place])     On Renal Transplant List," [facility]; MWF" (10/23/2016)   GERD (gastroesophageal reflux disease)    with certain foods   GI bleed    Hypertension    diet controlled   Parathyroid abnormality (HCC)    ectopic parathyroid gland   Presence of arteriovenous fistula for hemodialysis, primary ([place])    RUE   Refusal of blood product    NO WHOLE BLOOD PROUCTS   Renal cell carcinoma ([place])    s/p hand assisted laparoscopic bilateral nephrectomies 11/29/17, + RCC left   Secondary hyperparathyroidism ([place])    Seizures ([place])    one episode in past, due to" elevated Potassium" 08/02/20- "at least 4 years ago"   Sleep apnea    doesn't use CPAP anymore since weight loss   Stroke [facility])    no residual    Past Surgical History:  Procedure Laterality Date   AV FISTULA PLACEMENT Right    right arm   BIOPSY  08/01/2019    Procedure: BIOPSY;  Surgeon: Juanita Craver, MD;  Location: [facility] ENDOSCOPY;  Service: Endoscopy;;   BIOPSY  11/18/2020   Procedure: BIOPSY;  Surgeon: Daryel November, MD;  Location: [facility] ENDOSCOPY;  Service: Gastroenterology;;   CATARACT EXTRACTION W/ INTRAOCULAR LENS  IMPLANT, BILATERAL     COLON SURGERY     COLONOSCOPY N/A 08/04/2015   Procedure: COLONOSCOPY;  Surgeon: Jerene Bears, MD;  Location: [facility] ENDOSCOPY;  Service: Endoscopy;  Laterality: N/A;   COLONOSCOPY  2017   JMP@ [facility]-good prep-mass -recall 1 yr   COLONOSCOPY WITH PROPOFOL N/A 11/25/2020   Procedure: COLONOSCOPY WITH PROPOFOL;  Surgeon: Sharyn Creamer, MD;  Location: [place];  Service: Gastroenterology;  Laterality: N/A;   ESOPHAGOGASTRODUODENOSCOPY N/A 08/01/2019   Procedure: ESOPHAGOGASTRODUODENOSCOPY (EGD);  Surgeon: Juanita Craver, MD;  Location: [facility] ENDOSCOPY;  Service: Endoscopy;  Laterality: N/A;   ESOPHAGOGASTRODUODENOSCOPY N/A 11/18/2020   Procedure: ESOPHAGOGASTRODUODENOSCOPY (EGD);  Surgeon: Daryel November, MD;  Location: [place];  Service: Gastroenterology;  Laterality: N/A;   ESOPHAGOGASTRODUODENOSCOPY (EGD) WITH PROPOFOL N/A 08/04/2019   Procedure: ESOPHAGOGASTRODUODENOSCOPY (EGD) WITH PROPOFOL;  Surgeon: Yetta Flock, MD;  Location:  [place] ENDOSCOPY;  Service: Gastroenterology;  Laterality: N/A;   graft left arm Left    for dialysis x 2. Removed   HOT HEMOSTASIS  11/18/2020   Procedure: HOT HEMOSTASIS (ARGON PLASMA COAGULATION/BICAP);  Surgeon: Daryel November, MD;  Location: [place];  Service: Gastroenterology;;   INSERTION OF DIALYSIS CATHETER     Rt chest   LAPAROSCOPIC RIGHT COLECTOMY N/A 08/05/2015   Procedure: LAPAROSCOPIC RIGHT COLECTOMY- ASCENDING;  Surgeon: Stark Klein, MD;  Location: [place];  Service: General;  Laterality: N/A;   MASS EXCISION Left 05/28/2019   Procedure: EXCISION SOFT TISSUE MASS LEFT SHOULDER;  Surgeon: Armandina Gemma, MD;  Location: [facility];  Service: General;   Laterality: Left;   NEPHRECTOMY Bilateral    PARATHYROIDECTOMY N/A 06/12/2016   Procedure: TOTAL PARATHYROIDECTOMY WITH AUTOTRANSPLANTATION TO LEFT FOREARM;  Surgeon: Armandina Gemma, MD;  Location: [place];  Service: General;  Laterality: N/A;   PARATHYROIDECTOMY N/A 10/23/2016   Procedure: PARATHYROIDECTOMY;  Surgeon: Armandina Gemma, MD;  Location: [place];  Service: General;  Laterality: N/A;   REVERSE SHOULDER ARTHROPLASTY Right 08/24/2020   Procedure: REVERSE SHOULDER ARTHROPLASTY;  Surgeon: Hiram Gash, MD;  Location: [place];  Service: Orthopedics;  Laterality: Right;   REVISON OF ARTERIOVENOUS FISTULA Right 07/16/2017   Procedure: REVISION OF ARTERIOVENOUS FISTULA  Right ARM;  Surgeon: Waynetta Sandy, MD;  Location: [employer];  Service: Vascular;  Laterality: Right;   TOTAL HIP ARTHROPLASTY Left 11/14/2020   Procedure: LEFT TOTAL HIP ARTHROPLASTY ANTERIOR APPROACH;  Surgeon: Leandrew Koyanagi, MD;  Location: [place];  Service: Orthopedics;  Laterality: Left;   UPPER GASTROINTESTINAL ENDOSCOPY  2021   @ [facility]    There were no vitals filed for this visit.   Subjective Assessment - 03/23/21 0856     Subjective I must have slept wrong last night, I am having a lot of pain.    Pertinent History L THA in August 2022, R shoulder rotator cuff repair March 2022.    Patient Stated Goals Less pain when doing activities and increased strength.    Currently in Pain? Yes    Pain Score 8     Pain Location Shoulder    Pain Orientation Right    Pain Descriptors / Indicators Sore    Pain Type Chronic pain                [facility] PT Assessment - 03/23/21 0001       Assessment   Medical Diagnosis M25.561 (ICD-10-CM) - Pain in right knee  R29.898 (ICD-10-CM) - Other symptoms and signs involving the musculoskeletal system    Referring Provider (PT) Dr Lauretta Grill [place]    Hand Dominance Right      AROM   AROM Assessment Site Shoulder    Right/Left Shoulder Right    Right Shoulder Flexion 125 Degrees    in supine   Right Shoulder ABduction 77 Degrees   in supine                          [facility] Adult PT Treatment/Exercise - 03/23/21 0001       Knee/Hip Exercises: Aerobic   Nustep L5 x 10 min with PT discussing current status, what pt does at home for HEP      Knee/Hip Exercises: Standing   Hip Abduction Both;1 set;15 reps;Knee straight    Abduction Limitations on foam pad    Hip Extension Both;1 set;15 reps;Knee straight    Extension  Limitations on foam pad    Forward Step Up 2 sets;10 reps;Step Height: 8";Hand Hold: 1      Knee/Hip Exercises: Seated   Sit to Sand 2 sets;10 reps;with UE support   sitting on one pad, holding 10# kettlebelll x10 with chest press, x10 with overhead press.     Shoulder Exercises: Seated   Flexion Strengthening;Both;20 reps;Weights    Flexion Weight (lbs) 3#-holding on each side      Shoulder Exercises: Standing   ABduction Strengthening;Both;20 reps    Shoulder ABduction Weight (lbs) 3    Extension Strengthening;Both;20 reps    Theraband Level (Shoulder Extension) Level 4 (Blue)    Row Strengthening;Both;20 reps    Theraband Level (Shoulder Row) Level 4 (Blue)      Shoulder Exercises: ROM/Strengthening   Wall Wash flexion and scaption 2x10 RUE                       PT Short Term Goals - 03/16/21 0818       PT SHORT TERM GOAL #1   Title Pt will be independent with initial HEP.    Status Achieved               PT Long Term Goals - 03/23/21 1043       PT LONG TERM GOAL #1   Title be independent in advanced HEP    Status On-going      PT LONG TERM GOAL #2   Title improve BLE strength to at least 4+/5 to negotiate steps without increased pain.    Status On-going      PT LONG TERM GOAL #3   Title Pt to increase R shoulder flexion and abduction to at least 115 degrees to allow him to reach into overhead cabinets    Status On-going      PT LONG TERM GOAL #4   Title Pt able to negotiate steps with  reciprocal pattern with use of unilateral rail to  allow him to do laundry in his home.    Status On-going      PT LONG TERM GOAL #5   Title Increase FOTO to at least 63% to allow pt to perform activities with decreased complication.    Status On-going                   Plan - 03/23/21 1039     Clinical Impression Statement Pt continues to progres with strength and activity tolerance.  Pt with difficulty with R shoulder flexion and abdcution in sitting, but when positioned in supine, he demonstrates improved A/ROM in gravity minimized postion.  Pt tolerates ther ex well and able to demonstrate increased AA/ROM when performing wall washing exercise.    Personal Factors and Comorbidities Fitness;Comorbidity 3+    Comorbidities L THA, R shoulder surgery, ESRD on hemodialysis    PT Treatment/Interventions ADLs/Self Care Home Management;Aquatic Therapy;Cryotherapy;Electrical Stimulation;Iontophoresis 4mg /ml Dexamethasone;Moist Heat;Gait training;Stair training;Functional mobility training;Therapeutic activities;Therapeutic exercise;Balance training;Neuromuscular re-education;Patient/family education;Manual techniques;Passive range of motion;Dry needling;Taping;Vasopneumatic Device;Joint Manipulations    PT Next Visit Plan hip and shoulder strength, work on sit to stand    PT Home Exercise Plan Access Code: [email or web address]    Consulted and Agree with Plan of Care Patient             Patient will benefit from skilled therapeutic intervention in order to improve the following deficits and impairments:  Decreased range of motion, Difficulty walking, Increased muscle spasms, Impaired UE functional use, Pain,  Impaired flexibility, Decreased strength  Visit Diagnosis: Muscle weakness (generalized)  Other abnormalities of gait and mobility  Stiffness of right shoulder, not elsewhere classified  Chronic pain of left knee  Chronic pain of right knee  Stiffness of left hip, not elsewhere  classified  Pain in left hip     Problem List Patient Active Problem List   Diagnosis Date Noted   Mucosal abnormality of esophagus    Mucosal abnormality of stomach    Mucosal abnormality of duodenum    Upper GI bleed    Left hip pain 11/16/2020   Type 2 diabetes mellitus with other specified complication ([place]) 13/24/4010   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/14/2020   Primary osteoarthritis of left hip 10/07/2020   Status post reverse total arthroplasty of right shoulder 08/24/2020   Diverticulitis 03/08/2020   ESRD on hemodialysis ([place]) 03/08/2020   Polyneuropathy in diseases classified elsewhere ([place]) 03/08/2020   Pure hypercholesterolemia 03/08/2020   Sacral back pain 03/08/2020   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 11/10/2019   Acute gastric ulcer with hemorrhage    Hypotension of hemodialysis 07/31/2019   Diverticulitis large intestine 07/31/2019   Mass of soft tissue of shoulder 05/26/2019   Awaiting organ transplant status 03/18/2019   Finding of cocaine in blood 02/25/2019   Anaphylactic shock, unspecified, sequela 11/21/2018   Hypercalcemia 10/28/2018   Diarrhea, unspecified 08/08/2018   Hyperkalemia 03/12/2018   Patient is Jehovah's Witness 01/30/2018   Renal cell carcinoma of left kidney ([place]) 12/24/2017   Fluid overload, unspecified 07/06/2017   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   Other specified disorders of kidney and ureter 11/22/2016   Secondary hyperparathyroidism of renal origin ([place]) 10/23/2016   Other headache syndrome 08/21/2016   TIA (transient ischemic attack)    Essential hypertension    Seizures ([place])    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    Lung nodule    Diverticulitis of cecum 07/21/2015   Diverticulitis of large intestine without perforation or abscess without bleeding    Right lower quadrant pain    Obstructive sleep apnea 02/08/2015   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    Diverticulosis 07/13/2014   Lower GI bleed 07/12/2014   Refusal of blood transfusions as patient is Jehovah's Witness    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   HLD (hyperlipidemia)    PRES (posterior reversible encephalopathy syndrome)    History of lacunar cerebrovascular accident (CVA) 05/23/2014   Anemia of renal disease 05/23/2014   GERD (gastroesophageal reflux disease) 12/09/2012   Gastrointestinal bleeding 11/12/2012   Melena 11/12/2012   Other psychoactive substance dependence, uncomplicated ([place]) 27/25/3664   Iron deficiency anemia, unspecified 06/10/2012   Moderate protein-calorie malnutrition ([place]) 07/29/2009   Coagulation defect, unspecified ([place]) 08/29/2006   Type 2 diabetes mellitus with diabetic peripheral angiopathy without gangrene ([place]) 08/29/2006   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 stage renal disease ([place]) 08/02/2006    Juel Burrow, PT, DPT 03/23/2021, 10:45 AM  [facility] @ [place], [place], [address] Phone: [phone]   Fax:  [phone]  Name: Teron Blais Sr. MRN: [id number] Date of Birth: [birth year]

## 2021-03-24 DIAGNOSIS — Z7682 Awaiting organ transplant status: Secondary | ICD-10-CM | POA: Diagnosis not present

## 2021-03-24 DIAGNOSIS — D509 Iron deficiency anemia, unspecified: Secondary | ICD-10-CM | POA: Diagnosis not present

## 2021-03-24 DIAGNOSIS — R197 Diarrhea, unspecified: Secondary | ICD-10-CM | POA: Diagnosis not present

## 2021-03-24 DIAGNOSIS — Z992 Dependence on renal dialysis: Secondary | ICD-10-CM | POA: Diagnosis not present

## 2021-03-24 DIAGNOSIS — D631 Anemia in chronic kidney disease: Secondary | ICD-10-CM | POA: Diagnosis not present

## 2021-03-24 DIAGNOSIS — N2581 Secondary hyperparathyroidism of renal origin: Secondary | ICD-10-CM | POA: Diagnosis not present

## 2021-03-24 DIAGNOSIS — E875 Hyperkalemia: Secondary | ICD-10-CM | POA: Diagnosis not present

## 2021-03-24 DIAGNOSIS — N186 End stage renal disease: Secondary | ICD-10-CM | POA: Diagnosis not present

## 2021-03-24 DIAGNOSIS — D689 Coagulation defect, unspecified: Secondary | ICD-10-CM | POA: Diagnosis not present

## 2021-03-27 ENCOUNTER — Encounter: Payer: Self-pay | Admitting: Physical Therapy

## 2021-03-27 ENCOUNTER — Other Ambulatory Visit: Payer: Self-pay

## 2021-03-27 ENCOUNTER — Ambulatory Visit: Payer: Medicare Other | Admitting: Physical Therapy

## 2021-03-27 DIAGNOSIS — M6281 Muscle weakness (generalized): Secondary | ICD-10-CM | POA: Diagnosis not present

## 2021-03-27 DIAGNOSIS — G8929 Other chronic pain: Secondary | ICD-10-CM | POA: Diagnosis not present

## 2021-03-27 DIAGNOSIS — M25611 Stiffness of right shoulder, not elsewhere classified: Secondary | ICD-10-CM

## 2021-03-27 DIAGNOSIS — M25561 Pain in right knee: Secondary | ICD-10-CM | POA: Diagnosis not present

## 2021-03-27 DIAGNOSIS — R197 Diarrhea, unspecified: Secondary | ICD-10-CM | POA: Diagnosis not present

## 2021-03-27 DIAGNOSIS — M25652 Stiffness of left hip, not elsewhere classified: Secondary | ICD-10-CM | POA: Diagnosis not present

## 2021-03-27 DIAGNOSIS — R2689 Other abnormalities of gait and mobility: Secondary | ICD-10-CM | POA: Diagnosis not present

## 2021-03-27 DIAGNOSIS — M25552 Pain in left hip: Secondary | ICD-10-CM | POA: Diagnosis not present

## 2021-03-27 DIAGNOSIS — E875 Hyperkalemia: Secondary | ICD-10-CM | POA: Diagnosis not present

## 2021-03-27 DIAGNOSIS — N2581 Secondary hyperparathyroidism of renal origin: Secondary | ICD-10-CM | POA: Diagnosis not present

## 2021-03-27 DIAGNOSIS — Z7682 Awaiting organ transplant status: Secondary | ICD-10-CM | POA: Diagnosis not present

## 2021-03-27 DIAGNOSIS — D509 Iron deficiency anemia, unspecified: Secondary | ICD-10-CM | POA: Diagnosis not present

## 2021-03-27 DIAGNOSIS — D631 Anemia in chronic kidney disease: Secondary | ICD-10-CM | POA: Diagnosis not present

## 2021-03-27 DIAGNOSIS — Z992 Dependence on renal dialysis: Secondary | ICD-10-CM | POA: Diagnosis not present

## 2021-03-27 DIAGNOSIS — N186 End stage renal disease: Secondary | ICD-10-CM | POA: Diagnosis not present

## 2021-03-27 DIAGNOSIS — M25562 Pain in left knee: Secondary | ICD-10-CM | POA: Diagnosis not present

## 2021-03-27 DIAGNOSIS — D689 Coagulation defect, unspecified: Secondary | ICD-10-CM | POA: Diagnosis not present

## 2021-03-27 NOTE — Therapy (Signed)
[facility] @ [place], [place], [address] Phone: [phone]   Fax:  [phone]  Physical Therapy Treatment  Patient Details  Name: Carl Jandreau Sr. MRN: [id number] Date of Birth: Mar 08, 1960 Referring Provider (PT): Dr [name]   Encounter Date: 03/27/2021   PT End of Session - 03/27/21 1056     Visit Number 13    Date for PT Re-Evaluation 04/07/21    Authorization Type UHC Medicare    Progress Note Due on Visit 20    PT Start Time 1055    PT Stop Time 1135    PT Time Calculation (min) 40 min    Activity Tolerance Patient tolerated treatment well    Behavior During Therapy WFL for tasks assessed/performed             Past Medical History:  Diagnosis Date   Acute gastric ulcer with hemorrhage    Acute GI bleeding 07/31/2019   Acute pericarditis    Anemia    hx of   Anxiety    situational    Arthritis    on meds   Borderline diabetes    Cataract    bilateral sx   Chronic headaches    Depression    situational    Diverticulitis    ESRD (end stage renal disease) ([place])     On Renal Transplant List," [facility]; MWF" (10/23/2016)   GERD (gastroesophageal reflux disease)    with certain foods   GI bleed    Hypertension    diet controlled   Parathyroid abnormality (HCC)    ectopic parathyroid gland   Presence of arteriovenous fistula for hemodialysis, primary ([place])    RUE   Refusal of blood product    NO WHOLE BLOOD PROUCTS   Renal cell carcinoma ([place])    s/p hand assisted laparoscopic bilateral nephrectomies 11/29/17, + RCC left   Secondary hyperparathyroidism ([place])    Seizures ([place])    one episode in past, due to" elevated Potassium" 08/02/20- "at least 4 years ago"   Sleep apnea    doesn't use CPAP anymore since weight loss   Stroke [facility])    no residual    Past Surgical History:  Procedure Laterality Date   AV FISTULA PLACEMENT Right    right arm   BIOPSY  08/01/2019    Procedure: BIOPSY;  Surgeon: Juanita Craver, MD;  Location: [facility] ENDOSCOPY;  Service: Endoscopy;;   BIOPSY  11/18/2020   Procedure: BIOPSY;  Surgeon: Daryel November, MD;  Location: [facility] ENDOSCOPY;  Service: Gastroenterology;;   CATARACT EXTRACTION W/ INTRAOCULAR LENS  IMPLANT, BILATERAL     COLON SURGERY     COLONOSCOPY N/A 08/04/2015   Procedure: COLONOSCOPY;  Surgeon: Jerene Bears, MD;  Location: [facility] ENDOSCOPY;  Service: Endoscopy;  Laterality: N/A;   COLONOSCOPY  2017   JMP@ [facility]-good prep-mass -recall 1 yr   COLONOSCOPY WITH PROPOFOL N/A 11/25/2020   Procedure: COLONOSCOPY WITH PROPOFOL;  Surgeon: Sharyn Creamer, MD;  Location: [place];  Service: Gastroenterology;  Laterality: N/A;   ESOPHAGOGASTRODUODENOSCOPY N/A 08/01/2019   Procedure: ESOPHAGOGASTRODUODENOSCOPY (EGD);  Surgeon: Juanita Craver, MD;  Location: [facility] ENDOSCOPY;  Service: Endoscopy;  Laterality: N/A;   ESOPHAGOGASTRODUODENOSCOPY N/A 11/18/2020   Procedure: ESOPHAGOGASTRODUODENOSCOPY (EGD);  Surgeon: Daryel November, MD;  Location: [place];  Service: Gastroenterology;  Laterality: N/A;   ESOPHAGOGASTRODUODENOSCOPY (EGD) WITH PROPOFOL N/A 08/04/2019   Procedure: ESOPHAGOGASTRODUODENOSCOPY (EGD) WITH PROPOFOL;  Surgeon: Yetta Flock, MD;  Location:  [place] ENDOSCOPY;  Service: Gastroenterology;  Laterality: N/A;   graft left arm Left    for dialysis x 2. Removed   HOT HEMOSTASIS  11/18/2020   Procedure: HOT HEMOSTASIS (ARGON PLASMA COAGULATION/BICAP);  Surgeon: Daryel November, MD;  Location: [place];  Service: Gastroenterology;;   INSERTION OF DIALYSIS CATHETER     Rt chest   LAPAROSCOPIC RIGHT COLECTOMY N/A 08/05/2015   Procedure: LAPAROSCOPIC RIGHT COLECTOMY- ASCENDING;  Surgeon: Stark Klein, MD;  Location: [place];  Service: General;  Laterality: N/A;   MASS EXCISION Left 05/28/2019   Procedure: EXCISION SOFT TISSUE MASS LEFT SHOULDER;  Surgeon: Armandina Gemma, MD;  [facility]: [facility];  Service: General;   Laterality: Left;   NEPHRECTOMY Bilateral    PARATHYROIDECTOMY N/A 06/12/2016   Procedure: TOTAL PARATHYROIDECTOMY WITH AUTOTRANSPLANTATION TO LEFT FOREARM;  Surgeon: Armandina Gemma, MD;  Location: [place];  Service: General;  Laterality: N/A;   PARATHYROIDECTOMY N/A 10/23/2016   Procedure: PARATHYROIDECTOMY;  Surgeon: Armandina Gemma, MD;  Location: [place];  Service: General;  Laterality: N/A;   REVERSE SHOULDER ARTHROPLASTY Right 08/24/2020   Procedure: REVERSE SHOULDER ARTHROPLASTY;  Surgeon: Hiram Gash, MD;  Location: [place];  Service: Orthopedics;  Laterality: Right;   REVISON OF ARTERIOVENOUS FISTULA Right 07/16/2017   Procedure: REVISION OF ARTERIOVENOUS FISTULA  Right ARM;  Surgeon: Waynetta Sandy, MD;  Location: [place];  Service: Vascular;  Laterality: Right;   TOTAL HIP ARTHROPLASTY Left 11/14/2020   Procedure: LEFT TOTAL HIP ARTHROPLASTY ANTERIOR APPROACH;  Surgeon: Leandrew Koyanagi, MD;  Location: [place];  Service: Orthopedics;  Laterality: Left;   UPPER GASTROINTESTINAL ENDOSCOPY  2021   @ [facility]    There were no vitals filed for this visit.   Subjective Assessment - 03/27/21 1100     Subjective Shoulder and elbow are a little sore but I'm doing alright.    Pertinent History L THA in August 2022, R shoulder rotator cuff repair March 2022.    Currently in Pain? Yes    Pain Score 5     Pain Orientation Right    Pain Descriptors / Indicators Sore    Aggravating Factors  Maybe weather?    Pain Relieving Factors Rest, exercise    Multiple Pain Sites No                               [facility] Adult PT Treatment/Exercise - 03/27/21 0001       Knee/Hip Exercises: Aerobic   Nustep L5 x 10 min with PT discussing current status, what pt does at home for HEP      Knee/Hip Exercises: Standing   Hip Abduction Both;1 set;15 reps;Knee straight    Abduction Limitations on 2 foam pad    Hip Extension Both;1 set;15 reps;Knee straight    Extension Limitations on 2 foam  pad    Forward Step Up Both;1 set;15 reps;Hand Hold: 1;Step Height: 8"      Shoulder Exercises: Supine   Other Supine Exercises 6# punches 2x10      Shoulder Exercises: Seated   Flexion AAROM;Right;20 reps    Flexion Limitations Cane, min asst by PTA    Abduction AROM;Both;20 reps    ABduction Limitations VC for technique      Shoulder Exercises: Sidelying   External Rotation AROM;Strengthening;Right    External Rotation Weight (lbs) 3x10      Shoulder Exercises: Standing   Extension Strengthening;Both;20 reps  Theraband Level (Shoulder Extension) Level 4 (Blue)    Row Strengthening;Both;20 reps    Theraband Level (Shoulder Row) Level 4 (Blue)      Shoulder Exercises: ROM/Strengthening   UBE (Upper Arm Bike) Level 1.7 3x3                       PT Short Term Goals - 03/16/21 0818       PT SHORT TERM GOAL #1   Title Pt will be independent with initial HEP.    Status Achieved               PT Long Term Goals - 03/23/21 1043       PT LONG TERM GOAL #1   Title be independent in advanced HEP    Status On-going      PT LONG TERM GOAL #2   Title improve BLE strength to at least 4+/5 to negotiate steps without increased pain.    Status On-going      PT LONG TERM GOAL #3   Title Pt to increase R shoulder flexion and abduction to at least 115 degrees to allow him to reach into overhead cabinets    Status On-going      PT LONG TERM GOAL #4   Title Pt able to negotiate steps with reciprocal pattern with use of unilateral rail to  allow him to do laundry in his home.    Status On-going      PT LONG TERM GOAL #5   Title Increase FOTO to at least 63% to allow pt to perform activities with decreased complication.    Status On-going                   Plan - 03/27/21 1129     Clinical Impression Statement Pt arrives to PT with min sore RT shoulder and elbow. Pt progressed to seated AAROM for Rt shoulder flexion, otherwise pt continues to  progress well through the exercises. Sidelying ER AROM technique improving, pt able to punch 6# today in flexion.    Personal Factors and Comorbidities Fitness;Comorbidity 3+    Comorbidities L THA, R shoulder surgery, ESRD on hemodialysis    Examination-Activity Limitations Locomotion Level;Reach Overhead;Squat;Stairs    Examination-Participation Restrictions Community Activity;Yard Work    [employer]chant [employer] officer Evolving/Moderate complexity    Rehab Potential Good    PT Frequency 2x / week    PT Duration 8 weeks    PT Treatment/Interventions ADLs/Self Care Home Management;Aquatic Therapy;Cryotherapy;Electrical Stimulation;Iontophoresis 4mg /ml Dexamethasone;Moist Heat;Gait training;Stair training;Functional mobility training;Therapeutic activities;Therapeutic exercise;Balance training;Neuromuscular re-education;Patient/family education;Manual techniques;Passive range of motion;Dry needling;Taping;Vasopneumatic Device;Joint Manipulations    PT Next Visit Plan hip and shoulder strength, work on sit to stand    PT Home Exercise Plan Access Code: [email or web address]    Consulted and Agree with Plan of Care Patient             Patient will benefit from skilled therapeutic intervention in order to improve the following deficits and impairments:  Decreased range of motion, Difficulty walking, Increased muscle spasms, Impaired UE functional use, Pain, Impaired flexibility, Decreased strength  Visit Diagnosis: Muscle weakness (generalized)  Other abnormalities of gait and mobility  Stiffness of right shoulder, not elsewhere classified     Problem List Patient Active Problem List   Diagnosis Date Noted   Mucosal abnormality of esophagus    Mucosal abnormality of stomach    Mucosal abnormality of duodenum    Upper GI bleed  Left hip pain 11/16/2020   Type 2 diabetes mellitus with other specified complication ([place]) 11/28/5100   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/14/2020    Primary osteoarthritis of left hip 10/07/2020   Status post reverse total arthroplasty of right shoulder 08/24/2020   Diverticulitis 03/08/2020   ESRD on hemodialysis ([place]) 03/08/2020   Polyneuropathy in diseases classified elsewhere ([place]) 03/08/2020   Pure hypercholesterolemia 03/08/2020   Sacral back pain 03/08/2020   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 11/10/2019   Acute gastric ulcer with hemorrhage    Hypotension of hemodialysis 07/31/2019   Diverticulitis large intestine 07/31/2019   Mass of soft tissue of shoulder 05/26/2019   Awaiting organ transplant status 03/18/2019   Finding of cocaine in blood 02/25/2019   Anaphylactic shock, unspecified, sequela 11/21/2018   Hypercalcemia 10/28/2018   Diarrhea, unspecified 08/08/2018   Hyperkalemia 03/12/2018   Patient is Jehovah's Witness 01/30/2018   Renal cell carcinoma of left kidney ([place]) 12/24/2017   Fluid overload, unspecified 07/06/2017   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   Other specified disorders of kidney and ureter 11/22/2016   Secondary hyperparathyroidism of renal origin ([place]) 10/23/2016   Other headache syndrome 08/21/2016   TIA (transient ischemic attack)    Essential hypertension    Seizures ([place])    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    Lung nodule    Diverticulitis of cecum 07/21/2015   Diverticulitis of large intestine without perforation or abscess without bleeding    Right lower quadrant pain    Obstructive sleep apnea 02/08/2015   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   Diverticulosis 07/13/2014   Lower GI bleed 07/12/2014   Refusal of blood transfusions as patient is Jehovah's Witness    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   HLD (hyperlipidemia)    PRES (posterior reversible encephalopathy syndrome)    History of lacunar cerebrovascular accident (CVA) 05/23/2014   Anemia of renal disease 05/23/2014   GERD  (gastroesophageal reflux disease) 12/09/2012   Gastrointestinal bleeding 11/12/2012   Melena 11/12/2012   Other psychoactive substance dependence, uncomplicated ([place]) 58/52/7782   Iron deficiency anemia, unspecified 06/10/2012   Moderate protein-calorie malnutrition ([place]) 07/29/2009   Coagulation defect, unspecified ([place]) 08/29/2006   Type 2 diabetes mellitus with diabetic peripheral angiopathy without gangrene ([place]) 08/29/2006   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 stage renal disease ([place]) 08/02/2006    [name], PTA 03/27/2021, 11:32 AM  [facility] @ [place], [place], [address] Phone: [phone]   Fax:  [phone]  Name: Carl Atkison Sr. MRN: [id number] Date of Birth: 18-Feb-1960

## 2021-03-29 ENCOUNTER — Ambulatory Visit: Payer: Medicare Other

## 2021-03-29 ENCOUNTER — Other Ambulatory Visit: Payer: Self-pay

## 2021-03-29 DIAGNOSIS — M25561 Pain in right knee: Secondary | ICD-10-CM

## 2021-03-29 DIAGNOSIS — M25652 Stiffness of left hip, not elsewhere classified: Secondary | ICD-10-CM | POA: Diagnosis not present

## 2021-03-29 DIAGNOSIS — D689 Coagulation defect, unspecified: Secondary | ICD-10-CM | POA: Diagnosis not present

## 2021-03-29 DIAGNOSIS — R197 Diarrhea, unspecified: Secondary | ICD-10-CM | POA: Diagnosis not present

## 2021-03-29 DIAGNOSIS — R2689 Other abnormalities of gait and mobility: Secondary | ICD-10-CM | POA: Diagnosis not present

## 2021-03-29 DIAGNOSIS — Z992 Dependence on renal dialysis: Secondary | ICD-10-CM | POA: Diagnosis not present

## 2021-03-29 DIAGNOSIS — M25552 Pain in left hip: Secondary | ICD-10-CM | POA: Diagnosis not present

## 2021-03-29 DIAGNOSIS — G8929 Other chronic pain: Secondary | ICD-10-CM | POA: Diagnosis not present

## 2021-03-29 DIAGNOSIS — M25562 Pain in left knee: Secondary | ICD-10-CM | POA: Diagnosis not present

## 2021-03-29 DIAGNOSIS — M25611 Stiffness of right shoulder, not elsewhere classified: Secondary | ICD-10-CM | POA: Diagnosis not present

## 2021-03-29 DIAGNOSIS — N2581 Secondary hyperparathyroidism of renal origin: Secondary | ICD-10-CM | POA: Diagnosis not present

## 2021-03-29 DIAGNOSIS — N186 End stage renal disease: Secondary | ICD-10-CM | POA: Diagnosis not present

## 2021-03-29 DIAGNOSIS — Z7682 Awaiting organ transplant status: Secondary | ICD-10-CM | POA: Diagnosis not present

## 2021-03-29 DIAGNOSIS — E875 Hyperkalemia: Secondary | ICD-10-CM | POA: Diagnosis not present

## 2021-03-29 DIAGNOSIS — M6281 Muscle weakness (generalized): Secondary | ICD-10-CM

## 2021-03-29 DIAGNOSIS — D509 Iron deficiency anemia, unspecified: Secondary | ICD-10-CM | POA: Diagnosis not present

## 2021-03-29 DIAGNOSIS — D631 Anemia in chronic kidney disease: Secondary | ICD-10-CM | POA: Diagnosis not present

## 2021-03-29 NOTE — Therapy (Signed)
OUTPATIENT PHYSICAL THERAPY TREATMENT NOTE   Patient Name: Carl Coye Sr. MRN: [id number] DOB:14-Jul-1960, 61 y.o., male Today's Date: 03/29/2021  PCP: [name], MD REFERRING PROVIDER: [name], MD   PT End of Session - 03/29/21 1139     Visit Number 14    Date for PT Re-Evaluation 04/07/21    Authorization Type UHC Medicare    Progress Note Due on Visit 17    PT Start Time 1100    PT Stop Time 1138    PT Time Calculation (min) 38 min    Activity Tolerance Patient tolerated treatment well    Behavior During Therapy WFL for tasks assessed/performed             Past Medical History:  Diagnosis Date   Acute gastric ulcer with hemorrhage    Acute GI bleeding 07/31/2019   Acute pericarditis    Anemia    hx of   Anxiety    situational    Arthritis    on meds   Borderline diabetes    Cataract    bilateral sx   Chronic headaches    Depression    situational    Diverticulitis    ESRD (end stage renal disease) ([place])     On Renal Transplant List," [facility]; MWF" (10/23/2016)   GERD (gastroesophageal reflux disease)    with certain foods   GI bleed    Hypertension    diet controlled   Parathyroid abnormality (HCC)    ectopic parathyroid gland   Presence of arteriovenous fistula for hemodialysis, primary ([place])    RUE   Refusal of blood product    NO WHOLE BLOOD PROUCTS   Renal cell carcinoma ([place])    s/p hand assisted laparoscopic bilateral nephrectomies 11/29/17, + RCC left   Secondary hyperparathyroidism ([place])    Seizures ([place])    one episode in past, due to" elevated Potassium" 08/02/20- "at least 4 years ago"   Sleep apnea    doesn't use CPAP anymore since weight loss   Stroke [facility])    no residual   Past Surgical History:  Procedure Laterality Date   AV FISTULA PLACEMENT Right    right arm   BIOPSY  08/01/2019   Procedure: BIOPSY;  Surgeon: Juanita Craver, MD;  Location: [facility] ENDOSCOPY;  Service: Endoscopy;;   BIOPSY  11/18/2020   Procedure:  BIOPSY;  Surgeon: Daryel November, MD;  [facility] ENDOSCOPY;  Service: Gastroenterology;;   CATARACT EXTRACTION W/ INTRAOCULAR LENS  IMPLANT, BILATERAL     COLON SURGERY     COLONOSCOPY N/A 08/04/2015   Procedure: COLONOSCOPY;  Surgeon: Jerene Bears, MD;  Location: [facility] ENDOSCOPY;  Service: Endoscopy;  Laterality: N/A;   COLONOSCOPY  2017   JMP@ [facility]-good prep-mass -recall 1 yr   COLONOSCOPY WITH PROPOFOL N/A 11/25/2020   Procedure: COLONOSCOPY WITH PROPOFOL;  Surgeon: Sharyn Creamer, MD;  Location: [place];  Service: Gastroenterology;  Laterality: N/A;   ESOPHAGOGASTRODUODENOSCOPY N/A 08/01/2019   Procedure: ESOPHAGOGASTRODUODENOSCOPY (EGD);  Surgeon: Juanita Craver, MD;  Location: [facility] ENDOSCOPY;  Service: Endoscopy;  Laterality: N/A;   ESOPHAGOGASTRODUODENOSCOPY N/A 11/18/2020   Procedure: ESOPHAGOGASTRODUODENOSCOPY (EGD);  Surgeon: Daryel November, MD;  Location: [place];  Service: Gastroenterology;  Laterality: N/A;   ESOPHAGOGASTRODUODENOSCOPY (EGD) WITH PROPOFOL N/A 08/04/2019   Procedure: ESOPHAGOGASTRODUODENOSCOPY (EGD) WITH PROPOFOL;  Surgeon: Yetta Flock, MD;  Location: [place];  Service: Gastroenterology;  Laterality: N/A;   graft left arm Left    for dialysis x 2. Removed  HOT HEMOSTASIS  11/18/2020   Procedure: HOT HEMOSTASIS (ARGON PLASMA COAGULATION/BICAP);  Surgeon: Daryel November, MD;  Location: [place];  Service: Gastroenterology;;   INSERTION OF DIALYSIS CATHETER     Rt chest   LAPAROSCOPIC RIGHT COLECTOMY N/A 08/05/2015   Procedure: LAPAROSCOPIC RIGHT COLECTOMY- ASCENDING;  Surgeon: Stark Klein, MD;  Location: [place];  Service: General;  Laterality: N/A;   MASS EXCISION Left 05/28/2019   Procedure: EXCISION SOFT TISSUE MASS LEFT SHOULDER;  Surgeon: Armandina Gemma, MD;  Location[facility];  Service: General;  Laterality: Left;   NEPHRECTOMY Bilateral    PARATHYROIDECTOMY N/A 06/12/2016   Procedure: TOTAL PARATHYROIDECTOMY WITH  AUTOTRANSPLANTATION TO LEFT FOREARM;  Surgeon: Armandina Gemma, MD;  Location: [place];  Service: General;  Laterality: N/A;   PARATHYROIDECTOMY N/A 10/23/2016   Procedure: PARATHYROIDECTOMY;  Surgeon: Armandina Gemma, MD;  Location: [place];  Service: General;  Laterality: N/A;   REVERSE SHOULDER ARTHROPLASTY Right 08/24/2020   Procedure: REVERSE SHOULDER ARTHROPLASTY;  Surgeon: Hiram Gash, MD;  Location: [place];  Service: Orthopedics;  Laterality: Right;   REVISON OF ARTERIOVENOUS FISTULA Right 07/16/2017   Procedure: REVISION OF ARTERIOVENOUS FISTULA  Right ARM;  Surgeon: Waynetta Sandy, MD;  Location: [place];  Service: Vascular;  Laterality: Right;   TOTAL HIP ARTHROPLASTY Left 11/14/2020   Procedure: LEFT TOTAL HIP ARTHROPLASTY ANTERIOR APPROACH;  Surgeon: Leandrew Koyanagi, MD;  Location: [place];  Service: Orthopedics;  Laterality: Left;   UPPER GASTROINTESTINAL ENDOSCOPY  2021   @ [facility]   Patient Active Problem List   Diagnosis Date Noted   Mucosal abnormality of esophagus    Mucosal abnormality of stomach    Mucosal abnormality of duodenum    Upper GI bleed    Left hip pain 11/16/2020   Type 2 diabetes mellitus with other specified complication ([place]) 60/11/9321   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/14/2020   Primary osteoarthritis of left hip 10/07/2020   Status post reverse total arthroplasty of right shoulder 08/24/2020   Diverticulitis 03/08/2020   ESRD on hemodialysis ([place]) 03/08/2020   Polyneuropathy in diseases classified elsewhere ([place]) 03/08/2020   Pure hypercholesterolemia 03/08/2020   Sacral back pain 03/08/2020   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 11/10/2019   Acute gastric ulcer with hemorrhage    Hypotension of hemodialysis 07/31/2019   Diverticulitis large intestine 07/31/2019   Mass of soft tissue of shoulder 05/26/2019   Awaiting organ transplant status 03/18/2019   Finding of cocaine in blood 02/25/2019   Anaphylactic shock, unspecified, sequela  11/21/2018   Hypercalcemia 10/28/2018   Diarrhea, unspecified 08/08/2018   Hyperkalemia 03/12/2018   Patient is Jehovah's Witness 01/30/2018   Renal cell carcinoma of left kidney ([place]) 12/24/2017   Fluid overload, unspecified 07/06/2017   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   Other specified disorders of kidney and ureter 11/22/2016   Secondary hyperparathyroidism of renal origin ([place]) 10/23/2016   Other headache syndrome 08/21/2016   TIA (transient ischemic attack)    Essential hypertension    Seizures ([place])    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    Lung nodule    Diverticulitis of cecum 07/21/2015   Diverticulitis of large intestine without perforation or abscess without bleeding    Right lower quadrant pain    Obstructive sleep apnea 02/08/2015   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   Diverticulosis 07/13/2014   Lower GI bleed 07/12/2014   Refusal of blood transfusions as patient is Jehovah's Witness    Parotid  mass    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   HLD (hyperlipidemia)    PRES (posterior reversible encephalopathy syndrome)    History of lacunar cerebrovascular accident (CVA) 05/23/2014   Anemia of renal disease 05/23/2014   GERD (gastroesophageal reflux disease) 12/09/2012   Gastrointestinal bleeding 11/12/2012   Melena 11/12/2012   Other psychoactive substance dependence, uncomplicated ([place]) 54/56/2563   Iron deficiency anemia, unspecified 06/10/2012   Moderate protein-calorie malnutrition ([place]) 07/29/2009   Coagulation defect, unspecified ([place]) 08/29/2006   Type 2 diabetes mellitus with diabetic peripheral angiopathy without gangrene ([place]) 08/29/2006   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 stage renal disease ([place]) 08/02/2006    REFERRING DIAG: M25.561 (ICD-10-CM) - Pain in right knee  R29.898 (ICD-10-CM) - Other symptoms and signs involving the  musculoskeletal system   THERAPY DIAG:  Muscle weakness (generalized)  Other abnormalities of gait and mobility  Stiffness of right shoulder, not elsewhere classified  Chronic pain of left knee  Chronic pain of right knee  PERTINENT HISTORY: Lt THA in August 2022, Rt shoulder rotator cuff repair March 2022. Dialysis  PRECAUTIONS: Dialysis 3x/wk  SUBJECTIVE: I'm doing well.  I am at 60-70% of my normal now.    PAIN:  Are you having pain? No   TODAY'S TREATMENT:  Treatment on 03/29/21  Nustep: level 5x10 min Arm Bike: level 1.7x 6 mine (3/3 Standing:  Hip abduction on foam pad: 2x10 Hip extension on foam pad: 2x10 Step up: 8" 2x10 Shoulder extension and row: blue band: 2x10 Supine punches: 6: 2x10 Sidelying ER without weight 2x10 Seated cane flexion: x15  PATIENT EDUCATION: Education details: [id number] Person educated: Patient Education method: Explanation, Demonstration, and Handouts Education comprehension: verbalized understanding and returned demonstration Access Code: [email or web address] URL: [email or web address] Date: 03/29/2021 Standing Hip Abduction with Counter Support - 1 x daily - 7 x weekly - 2 sets - 10 reps Standing Hip Extension with Counter Support - 1 x daily - 7 x weekly - 2 sets - 10 reps Forward Step Up - 1 x daily - 7 x weekly - 2 sets - 10 reps Standing Row with Anchored Resistance - 1 x daily - 7 x weekly - 2 sets - 10 reps Shoulder Extension with Resistance - 1 x daily - 7 x weekly - 2 sets - 10 reps  HOME EXERCISE PROGRAM: Access Code: [email or web address]   PT Short Term Goals - 03/16/21 0818       PT SHORT TERM GOAL #1   Title Pt will be independent with initial HEP.    Status Achieved              PT Long Term Goals - 03/23/21 1043       PT LONG TERM GOAL #1   Title be independent in advanced HEP    Status On-going      PT LONG TERM GOAL #2   Title improve BLE strength to at least 4+/5 to negotiate steps without increased pain.     Status On-going      PT LONG TERM GOAL #3   Title Pt to increase R shoulder flexion and abduction to at least 115 degrees to allow him to reach into overhead cabinets    Status On-going      PT LONG TERM GOAL #4   Title Pt able to negotiate steps with reciprocal pattern with use of unilateral rail to  allow him to do laundry in his home.  Status On-going      PT LONG TERM GOAL #5   Title Increase FOTO to at least 63% to allow pt to perform activities with decreased complication.    Status On-going            ASSESSMENT:  CLINICAL IMPRESSION:  Pt reports 60-70% overall improvement since the start of care.  PT added additional strength exercises to HEP today to allow for further strength advancement.  Pt with continued challenge with movement and use of Rt UE against gravity. Functional strength of LEs is improving, however pt remains challenged with sit to stand due to his height.  Pt will continue to benefit from skilled PT to address strength and functional use of Rt UE and Lt LE.       PT FREQUENCY: 2x/week  PT DURATION: 8 weeks  PLANNED INTERVENTIONS: Therapeutic exercises, Therapeutic activity, Neuro Muscular re-education, Balance training, Gait training, Patient/Family education, Joint mobilization, Stair training, Dry Needling, Cryotherapy, Moist heat, Taping, and Manual therapy  PLAN FOR NEXT SESSION: Continue to address strength, endurance and balance.  ERO next week to determine need for future PT.     Sigurd Sos, PT 03/29/21 11:42 AM

## 2021-03-31 DIAGNOSIS — Z7682 Awaiting organ transplant status: Secondary | ICD-10-CM | POA: Diagnosis not present

## 2021-03-31 DIAGNOSIS — D689 Coagulation defect, unspecified: Secondary | ICD-10-CM | POA: Diagnosis not present

## 2021-03-31 DIAGNOSIS — D509 Iron deficiency anemia, unspecified: Secondary | ICD-10-CM | POA: Diagnosis not present

## 2021-03-31 DIAGNOSIS — Z992 Dependence on renal dialysis: Secondary | ICD-10-CM | POA: Diagnosis not present

## 2021-03-31 DIAGNOSIS — N186 End stage renal disease: Secondary | ICD-10-CM | POA: Diagnosis not present

## 2021-03-31 DIAGNOSIS — E875 Hyperkalemia: Secondary | ICD-10-CM | POA: Diagnosis not present

## 2021-03-31 DIAGNOSIS — D631 Anemia in chronic kidney disease: Secondary | ICD-10-CM | POA: Diagnosis not present

## 2021-03-31 DIAGNOSIS — E7800 Pure hypercholesterolemia, unspecified: Secondary | ICD-10-CM | POA: Diagnosis not present

## 2021-03-31 DIAGNOSIS — R197 Diarrhea, unspecified: Secondary | ICD-10-CM | POA: Diagnosis not present

## 2021-03-31 DIAGNOSIS — N2581 Secondary hyperparathyroidism of renal origin: Secondary | ICD-10-CM | POA: Diagnosis not present

## 2021-04-02 DIAGNOSIS — M1612 Unilateral primary osteoarthritis, left hip: Secondary | ICD-10-CM | POA: Diagnosis not present

## 2021-04-03 ENCOUNTER — Encounter: Payer: Self-pay | Admitting: Rehabilitative and Restorative Service Providers"

## 2021-04-03 ENCOUNTER — Other Ambulatory Visit: Payer: Self-pay

## 2021-04-03 ENCOUNTER — Ambulatory Visit: Payer: Medicare Other | Admitting: Rehabilitative and Restorative Service Providers"

## 2021-04-03 DIAGNOSIS — R2689 Other abnormalities of gait and mobility: Secondary | ICD-10-CM | POA: Diagnosis not present

## 2021-04-03 DIAGNOSIS — D509 Iron deficiency anemia, unspecified: Secondary | ICD-10-CM | POA: Diagnosis not present

## 2021-04-03 DIAGNOSIS — G8929 Other chronic pain: Secondary | ICD-10-CM

## 2021-04-03 DIAGNOSIS — M25611 Stiffness of right shoulder, not elsewhere classified: Secondary | ICD-10-CM | POA: Diagnosis not present

## 2021-04-03 DIAGNOSIS — M25652 Stiffness of left hip, not elsewhere classified: Secondary | ICD-10-CM

## 2021-04-03 DIAGNOSIS — N186 End stage renal disease: Secondary | ICD-10-CM | POA: Diagnosis not present

## 2021-04-03 DIAGNOSIS — M25552 Pain in left hip: Secondary | ICD-10-CM | POA: Diagnosis not present

## 2021-04-03 DIAGNOSIS — M25562 Pain in left knee: Secondary | ICD-10-CM | POA: Diagnosis not present

## 2021-04-03 DIAGNOSIS — E875 Hyperkalemia: Secondary | ICD-10-CM | POA: Diagnosis not present

## 2021-04-03 DIAGNOSIS — M25561 Pain in right knee: Secondary | ICD-10-CM

## 2021-04-03 DIAGNOSIS — M6281 Muscle weakness (generalized): Secondary | ICD-10-CM | POA: Diagnosis not present

## 2021-04-03 DIAGNOSIS — N2581 Secondary hyperparathyroidism of renal origin: Secondary | ICD-10-CM | POA: Diagnosis not present

## 2021-04-03 DIAGNOSIS — Z7682 Awaiting organ transplant status: Secondary | ICD-10-CM | POA: Diagnosis not present

## 2021-04-03 DIAGNOSIS — D689 Coagulation defect, unspecified: Secondary | ICD-10-CM | POA: Diagnosis not present

## 2021-04-03 DIAGNOSIS — Z992 Dependence on renal dialysis: Secondary | ICD-10-CM | POA: Diagnosis not present

## 2021-04-03 DIAGNOSIS — D631 Anemia in chronic kidney disease: Secondary | ICD-10-CM | POA: Diagnosis not present

## 2021-04-03 DIAGNOSIS — R197 Diarrhea, unspecified: Secondary | ICD-10-CM | POA: Diagnosis not present

## 2021-04-03 NOTE — Therapy (Signed)
OUTPATIENT PHYSICAL THERAPY RE-EVALUATION NOTE   Patient Name: Carl Turman Sr. MRN: [id number] DOB:May 21, 1960, 61 y.o., male 68 Date: 04/03/2021  PCP: [name], MD REFERRING PROVIDER: [name], MD   PT End of Session - 04/03/21 1104     Visit Number 15    Date for PT Re-Evaluation 06/02/21    Authorization Type [employer] Medicare    Progress Note Due on Visit 20    PT Start Time 1100    PT Stop Time 1140    PT Time Calculation (min) 40 min    Activity Tolerance Patient tolerated treatment well    Behavior During Therapy WFL for tasks assessed/performed             Past Medical History:  Diagnosis Date   Acute gastric ulcer with hemorrhage    Acute GI bleeding 07/31/2019   Acute pericarditis    Anemia    hx of   Anxiety    situational    Arthritis    on meds   Borderline diabetes    Cataract    bilateral sx   Chronic headaches    Depression    situational    Diverticulitis    ESRD (end stage renal disease) ([place])     On Renal Transplant List," [facility]; MWF" (10/23/2016)   GERD (gastroesophageal reflux disease)    with certain foods   GI bleed    Hypertension    diet controlled   Parathyroid abnormality (HCC)    ectopic parathyroid gland   Presence of arteriovenous fistula for hemodialysis, primary ([place])    RUE   Refusal of blood product    NO WHOLE BLOOD PROUCTS   Renal cell carcinoma ([place])    s/p hand assisted laparoscopic bilateral nephrectomies 11/29/17, + RCC left   Secondary hyperparathyroidism ([place])    Seizures ([place])    one episode in past, due to" elevated Potassium" 08/02/20- "at least 4 years ago"   Sleep apnea    doesn't use CPAP anymore since weight loss   Stroke [facility])    no residual   Past Surgical History:  Procedure Laterality Date   AV FISTULA PLACEMENT Right    right arm   BIOPSY  08/01/2019   Procedure: BIOPSY;  Surgeon: Juanita Craver, MD;  Location: [facility] ENDOSCOPY;  Service: Endoscopy;;   BIOPSY  11/18/2020    Procedure: BIOPSY;  Surgeon: Daryel November, MD;  Location: [facility] ENDOSCOPY;  Service: Gastroenterology;;   CATARACT EXTRACTION W/ INTRAOCULAR LENS  IMPLANT, BILATERAL     COLON SURGERY     COLONOSCOPY N/A 08/04/2015   Procedure: COLONOSCOPY;  Surgeon: Jerene Bears, MD;  Location: [facility] ENDOSCOPY;  Service: Endoscopy;  Laterality: N/A;   COLONOSCOPY  2017   JMP@ [facility]-good prep-mass -recall 1 yr   COLONOSCOPY WITH PROPOFOL N/A 11/25/2020   Procedure: COLONOSCOPY WITH PROPOFOL;  Surgeon: Sharyn Creamer, MD;  Location: [place];  Service: Gastroenterology;  Laterality: N/A;   ESOPHAGOGASTRODUODENOSCOPY N/A 08/01/2019   Procedure: ESOPHAGOGASTRODUODENOSCOPY (EGD);  Surgeon: Juanita Craver, MD;  Location: [facility] ENDOSCOPY;  Service: Endoscopy;  Laterality: N/A;   ESOPHAGOGASTRODUODENOSCOPY N/A 11/18/2020   Procedure: ESOPHAGOGASTRODUODENOSCOPY (EGD);  Surgeon: Daryel November, MD;  Location: [place];  Service: Gastroenterology;  Laterality: N/A;   ESOPHAGOGASTRODUODENOSCOPY (EGD) WITH PROPOFOL N/A 08/04/2019   Procedure: ESOPHAGOGASTRODUODENOSCOPY (EGD) WITH PROPOFOL;  Surgeon: Yetta Flock, MD;  Location: [place];  Service: Gastroenterology;  Laterality: N/A;   graft left arm Left    for dialysis x 2. Removed  HOT HEMOSTASIS  11/18/2020   Procedure: HOT HEMOSTASIS (ARGON PLASMA COAGULATION/BICAP);  Surgeon: Daryel November, MD;  Location: [place];  Service: Gastroenterology;;   INSERTION OF DIALYSIS CATHETER     Rt chest   LAPAROSCOPIC RIGHT COLECTOMY N/A 08/05/2015   Procedure: LAPAROSCOPIC RIGHT COLECTOMY- ASCENDING;  Surgeon: Stark Klein, MD;  Location: [place];  Service: General;  Laterality: N/A;   MASS EXCISION Left 05/28/2019   Procedure: EXCISION SOFT TISSUE MASS LEFT SHOULDER;  Surgeon: Armandina Gemma, MD;  Location: [facility];  Service: General;  Laterality: Left;   NEPHRECTOMY Bilateral    PARATHYROIDECTOMY N/A 06/12/2016   Procedure: TOTAL PARATHYROIDECTOMY  WITH AUTOTRANSPLANTATION TO LEFT FOREARM;  Surgeon: Armandina Gemma, MD;  Location: [place];  Service: General;  Laterality: N/A;   PARATHYROIDECTOMY N/A 10/23/2016   Procedure: PARATHYROIDECTOMY;  Surgeon: Armandina Gemma, MD;  Location: [place];  Service: General;  Laterality: N/A;   REVERSE SHOULDER ARTHROPLASTY Right 08/24/2020   Procedure: REVERSE SHOULDER ARTHROPLASTY;  Surgeon: Hiram Gash, MD;  Location: [place];  Service: Orthopedics;  Laterality: Right;   REVISON OF ARTERIOVENOUS FISTULA Right 07/16/2017   Procedure: REVISION OF ARTERIOVENOUS FISTULA  Right ARM;  Surgeon: Waynetta Sandy, MD;  Location: [place];  Service: Vascular;  Laterality: Right;   TOTAL HIP ARTHROPLASTY Left 11/14/2020   Procedure: LEFT TOTAL HIP ARTHROPLASTY ANTERIOR APPROACH;  Surgeon: Leandrew Koyanagi, MD;  Location: [place];  Service: Orthopedics;  Laterality: Left;   UPPER GASTROINTESTINAL ENDOSCOPY  2021   @ [facility]   Patient Active Problem List   Diagnosis Date Noted   Mucosal abnormality of esophagus    Mucosal abnormality of stomach    Mucosal abnormality of duodenum    Upper GI bleed    Left hip pain 11/16/2020   Type 2 diabetes mellitus with other specified complication ([place]) 15/06/6977   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/14/2020   Primary osteoarthritis of left hip 10/07/2020   Status post reverse total arthroplasty of right shoulder 08/24/2020   Diverticulitis 03/08/2020   ESRD on hemodialysis ([place]) 03/08/2020   Polyneuropathy in diseases classified elsewhere ([place]) 03/08/2020   Pure hypercholesterolemia 03/08/2020   Sacral back pain 03/08/2020   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 11/10/2019   Acute gastric ulcer with hemorrhage    Hypotension of hemodialysis 07/31/2019   Diverticulitis large intestine 07/31/2019   Mass of soft tissue of shoulder 05/26/2019   Awaiting organ transplant status 03/18/2019   Finding of cocaine in blood 02/25/2019   Anaphylactic shock, unspecified, sequela  11/21/2018   Hypercalcemia 10/28/2018   Diarrhea, unspecified 08/08/2018   Hyperkalemia 03/12/2018   Patient is Jehovah's Witness 01/30/2018   Renal cell carcinoma of left kidney ([place]) 12/24/2017   Fluid overload, unspecified 07/06/2017   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   Other specified disorders of kidney and ureter 11/22/2016   Secondary hyperparathyroidism of renal origin ([place]) 10/23/2016   Other headache syndrome 08/21/2016   TIA (transient ischemic attack)    Essential hypertension    Seizures ([place])    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    Lung nodule    Diverticulitis of cecum 07/21/2015   Diverticulitis of large intestine without perforation or abscess without bleeding    Right lower quadrant pain    Obstructive sleep apnea 02/08/2015   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   Diverticulosis 07/13/2014   Lower GI bleed 07/12/2014   Refusal of blood transfusions as patient is Jehovah's Witness    Parotid  mass    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   HLD (hyperlipidemia)    PRES (posterior reversible encephalopathy syndrome)    History of lacunar cerebrovascular accident (CVA) 05/23/2014   Anemia of renal disease 05/23/2014   GERD (gastroesophageal reflux disease) 12/09/2012   Gastrointestinal bleeding 11/12/2012   Melena 11/12/2012   Other psychoactive substance dependence, uncomplicated ([place]) 17/79/3903   Iron deficiency anemia, unspecified 06/10/2012   Moderate protein-calorie malnutrition ([place]) 07/29/2009   Coagulation defect, unspecified ([place]) 08/29/2006   Type 2 diabetes mellitus with diabetic peripheral angiopathy without gangrene ([place]) 08/29/2006   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 Dialysis  PRECAUTIONS: Dialysis 3x/wk  SUBJECTIVE: Pt reports that he is doing okay, but reports some R shoulder pain today.   PAIN:  Are you having pain? Yes NPRS scale: 6-7/10 Pain location: right shoulder Pain orientation: Right  PAIN TYPE: aching Pain description: intermittent  Aggravating factors: pt believes due to weather and barometric pressure changes Relieving factors: movement   OBJECTIVE:  A/ROM right shoulder measured in standing: flexion 95 degrees, 87 degrees abduction  MMT:  R shoulder strength of 3/5, B hips and knee strength of 4- to 4/5  TODAY'S TREATMENT:   Treatment on 04/03/2021 Nustep L5 x10 min with PT present to discuss status. Leg Press 80# 2x10, seat at 9.  RLE then LLE only 50# x10 each Hip extension and abduction standing on foam pad 2x10 B Rocker board x1 min Fwd and Lateral step up on step stool x10B Wall wash flexion and scaption x12 each RUE Rows and shoulder extension with blue theraband 2x10B UBE level 1.7 x3 min each way (fwd/back) with PT present to discuss status  Treatment on 03/29/21  Nustep: level 5x10 min Arm Bike: level 1.7x 6 mine (3/3 Standing:  Hip abduction on foam pad: 2x10 Hip extension on foam pad: 2x10 Step up: 8" 2x10 Shoulder extension and row: blue band: 2x10 Supine punches: 6: 2x10 Sidelying ER without weight 2x10 Seated cane flexion: x15  PATIENT EDUCATION: Education details: [id number] Person educated: Patient Education method: Explanation, Demonstration, and Handouts Education comprehension: verbalized understanding and returned  demonstration Access Code: [email or web address] URL: [email or web address] Date: 03/29/2021 Standing Hip Abduction with Counter Support - 1 x daily - 7 x weekly - 2 sets - 10 reps Standing Hip Extension with Counter Support - 1 x daily - 7 x weekly - 2 sets - 10 reps Forward Step Up - 1 x daily - 7 x weekly - 2 sets - 10 reps Standing Row with Anchored Resistance - 1 x daily - 7 x weekly - 2 sets - 10 reps Shoulder Extension with Resistance - 1 x daily - 7 x weekly - 2 sets - 10 reps  HOME EXERCISE PROGRAM: Access Code: [email or web address]   PT Short Term Goals - 04/03/21 1147       PT SHORT TERM GOAL #1   Title Pt  will be independent with initial HEP.    Status Achieved              PT Long Term Goals - 04/03/21 1147       PT LONG TERM GOAL #1   Title be independent in advanced HEP    Status On-going      PT LONG TERM GOAL #2   Title improve BLE strength to at least 4+/5 to negotiate steps without increased pain.    Status On-going      PT LONG TERM GOAL #3   Title Pt to increase R shoulder flexion and abduction to at least 115 degrees to allow him to reach into overhead cabinets    Status On-going      PT LONG TERM GOAL #4   Title Pt able to negotiate steps with reciprocal pattern with use of unilateral rail to  allow him to do laundry in his home.    Status On-going      PT LONG TERM GOAL #5   Title Increase FOTO to at least 63% to allow pt to perform activities with decreased complication.    Status Achieved            ASSESSMENT:  CLINICAL IMPRESSION:   [name] is progressing towards goal related activities and has increased FOTO to 65% for his knee.  Pt states that he has been walking more, but has not yet reached the walking a mile status comfortably.  Pt continues with decreased R shoulder A/ROM and limited with shoulder strength more than LE.  Pt has not yet met all goals and continues to require skilled PT to progress towards his goal related activities.   Pt would benefit from an additional 2x/week for 8 weeks to progress towards improved mobility at home and in community. Pt in agreement with POC.  PERSONAL FACTORS AND COMORBIDITIES:  L THA, R shoulder surgery, ESRD on hemodialysis.  Patient will benefit from skilled therapeutic intervention in order to improve on the following deficits:  Decreased range of motion; Difficulty walking; Increased muscle spasms; Impaired UE functional use; Pain; Impaired flexibility; Decreased strength  PT FREQUENCY: 2x/week  PT DURATION: 8 weeks  Rehab Potential:  Good  Stability/Clinical Decision Making:  Evolving/Moderate complexity  PLANNED INTERVENTIONS: Therapeutic exercises, Therapeutic activity, Neuro Muscular re-education, Balance training, Gait training, Patient/Family education, Joint mobilization, Stair training, Dry Needling, Cryotherapy, Moist heat, Taping, and Manual therapy  PLAN FOR NEXT SESSION: Continue to address strength, endurance and balance.  ERO next week to determine need for future PT.     Juel Burrow, PT, DPT 04/03/21 12:04 PM   [facility] [address], [place], [place] [address] Phone # [phone] Fax [phone]

## 2021-04-05 DIAGNOSIS — R197 Diarrhea, unspecified: Secondary | ICD-10-CM | POA: Diagnosis not present

## 2021-04-05 DIAGNOSIS — N186 End stage renal disease: Secondary | ICD-10-CM | POA: Diagnosis not present

## 2021-04-05 DIAGNOSIS — Z7682 Awaiting organ transplant status: Secondary | ICD-10-CM | POA: Diagnosis not present

## 2021-04-05 DIAGNOSIS — Z992 Dependence on renal dialysis: Secondary | ICD-10-CM | POA: Diagnosis not present

## 2021-04-05 DIAGNOSIS — D509 Iron deficiency anemia, unspecified: Secondary | ICD-10-CM | POA: Diagnosis not present

## 2021-04-05 DIAGNOSIS — D631 Anemia in chronic kidney disease: Secondary | ICD-10-CM | POA: Diagnosis not present

## 2021-04-05 DIAGNOSIS — D689 Coagulation defect, unspecified: Secondary | ICD-10-CM | POA: Diagnosis not present

## 2021-04-05 DIAGNOSIS — N2581 Secondary hyperparathyroidism of renal origin: Secondary | ICD-10-CM | POA: Diagnosis not present

## 2021-04-05 DIAGNOSIS — E875 Hyperkalemia: Secondary | ICD-10-CM | POA: Diagnosis not present

## 2021-04-06 ENCOUNTER — Other Ambulatory Visit: Payer: Self-pay

## 2021-04-06 ENCOUNTER — Ambulatory Visit: Payer: Medicare Other | Admitting: Rehabilitative and Restorative Service Providers"

## 2021-04-06 ENCOUNTER — Encounter: Payer: Self-pay | Admitting: Rehabilitative and Restorative Service Providers"

## 2021-04-06 DIAGNOSIS — M25611 Stiffness of right shoulder, not elsewhere classified: Secondary | ICD-10-CM | POA: Diagnosis not present

## 2021-04-06 DIAGNOSIS — M25552 Pain in left hip: Secondary | ICD-10-CM | POA: Diagnosis not present

## 2021-04-06 DIAGNOSIS — M25652 Stiffness of left hip, not elsewhere classified: Secondary | ICD-10-CM

## 2021-04-06 DIAGNOSIS — R2689 Other abnormalities of gait and mobility: Secondary | ICD-10-CM | POA: Diagnosis not present

## 2021-04-06 DIAGNOSIS — G8929 Other chronic pain: Secondary | ICD-10-CM | POA: Diagnosis not present

## 2021-04-06 DIAGNOSIS — M6281 Muscle weakness (generalized): Secondary | ICD-10-CM | POA: Diagnosis not present

## 2021-04-06 DIAGNOSIS — M25562 Pain in left knee: Secondary | ICD-10-CM | POA: Diagnosis not present

## 2021-04-06 DIAGNOSIS — M25561 Pain in right knee: Secondary | ICD-10-CM

## 2021-04-06 NOTE — Therapy (Signed)
OUTPATIENT PHYSICAL THERAPY TREATMENT NOTE   Patient Name: Carl Decarlo Sr. MRN: [id number] DOB:[birth year], 61 y.o., male 72 Date: 04/06/2021  PCP: [name], MD REFERRING PROVIDER: [name], MD   PT End of Session - 04/06/21 1018     Visit Number 16    Date for PT Re-Evaluation 06/02/21    Authorization Type [employer] Medicare    Progress Note Due on Visit 20    PT Start Time 1015    PT Stop Time 1055    PT Time Calculation (min) 40 min    Activity Tolerance Patient tolerated treatment well    Behavior During Therapy WFL for tasks assessed/performed             Past Medical History:  Diagnosis Date   Acute gastric ulcer with hemorrhage    Acute GI bleeding 07/31/2019   Acute pericarditis    Anemia    hx of   Anxiety    situational    Arthritis    on meds   Borderline diabetes    Cataract    bilateral sx   Chronic headaches    Depression    situational    Diverticulitis    ESRD (end stage renal disease) ([place])     On Renal Transplant List," [facility]; MWF" (10/23/2016)   GERD (gastroesophageal reflux disease)    with certain foods   GI bleed    Hypertension    diet controlled   Parathyroid abnormality (HCC)    ectopic parathyroid gland   Presence of arteriovenous fistula for hemodialysis, primary ([place])    RUE   Refusal of blood product    NO WHOLE BLOOD PROUCTS   Renal cell carcinoma ([place])    s/p hand assisted laparoscopic bilateral nephrectomies 11/29/17, + RCC left   Secondary hyperparathyroidism ([place])    Seizures ([place])    one episode in past, due to" elevated Potassium" 08/02/20- "at least 4 years ago"   Sleep apnea    doesn't use CPAP anymore since weight loss   Stroke [facility])    no residual   Past Surgical History:  Procedure Laterality Date   AV FISTULA PLACEMENT Right    right arm   BIOPSY  08/01/2019   Procedure: BIOPSY;  Surgeon: Juanita Craver, MD;  Location: [facility] ENDOSCOPY;  Service: Endoscopy;;   BIOPSY  11/18/2020   Procedure:  BIOPSY;  Surgeon: Daryel November, MD;  Location: [facility] ENDOSCOPY;  Service: Gastroenterology;;   CATARACT EXTRACTION W/ INTRAOCULAR LENS  IMPLANT, BILATERAL     COLON SURGERY     COLONOSCOPY N/A 08/04/2015   Procedure: COLONOSCOPY;  Surgeon: Jerene Bears, MD;  Location: [facility] ENDOSCOPY;  Service: Endoscopy;  Laterality: N/A;   COLONOSCOPY  2017   JMP@ [facility]-good prep-mass -recall 1 yr   COLONOSCOPY WITH PROPOFOL N/A 11/25/2020   Procedure: COLONOSCOPY WITH PROPOFOL;  Surgeon: Sharyn Creamer, MD;  Location: [place];  Service: Gastroenterology;  Laterality: N/A;   ESOPHAGOGASTRODUODENOSCOPY N/A 08/01/2019   Procedure: ESOPHAGOGASTRODUODENOSCOPY (EGD);  Surgeon: Juanita Craver, MD;  Location: [facility] ENDOSCOPY;  Service: Endoscopy;  Laterality: N/A;   ESOPHAGOGASTRODUODENOSCOPY N/A 11/18/2020   Procedure: ESOPHAGOGASTRODUODENOSCOPY (EGD);  Surgeon: Daryel November, MD;  Location: [place];  Service: Gastroenterology;  Laterality: N/A;   ESOPHAGOGASTRODUODENOSCOPY (EGD) WITH PROPOFOL N/A 08/04/2019   Procedure: ESOPHAGOGASTRODUODENOSCOPY (EGD) WITH PROPOFOL;  Surgeon: Yetta Flock, MD;  Location: [place];  Service: Gastroenterology;  Laterality: N/A;   graft left arm Left    for dialysis x 2. Removed  HOT HEMOSTASIS  11/18/2020   Procedure: HOT HEMOSTASIS (ARGON PLASMA COAGULATION/BICAP);  Surgeon: Daryel November, MD;  Location: [place];  Service: Gastroenterology;;   INSERTION OF DIALYSIS CATHETER     Rt chest   LAPAROSCOPIC RIGHT COLECTOMY N/A 08/05/2015   Procedure: LAPAROSCOPIC RIGHT COLECTOMY- ASCENDING;  Surgeon: Stark Klein, MD;  Location: [place];  Service: General;  Laterality: N/A;   MASS EXCISION Left 05/28/2019   Procedure: EXCISION SOFT TISSUE MASS LEFT SHOULDER;  Surgeon: Armandina Gemma, MD;  Location: [facility];  Service: General;  Laterality: Left;   NEPHRECTOMY Bilateral    PARATHYROIDECTOMY N/A 06/12/2016   Procedure: TOTAL PARATHYROIDECTOMY WITH  AUTOTRANSPLANTATION TO LEFT FOREARM;  Surgeon: Armandina Gemma, MD;  Location: [place];  Service: General;  Laterality: N/A;   PARATHYROIDECTOMY N/A 10/23/2016   Procedure: PARATHYROIDECTOMY;  Surgeon: Armandina Gemma, MD;  Location: [place];  Service: General;  Laterality: N/A;   REVERSE SHOULDER ARTHROPLASTY Right 08/24/2020   Procedure: REVERSE SHOULDER ARTHROPLASTY;  Surgeon: Hiram Gash, MD;  Location: [place];  Service: Orthopedics;  Laterality: Right;   REVISON OF ARTERIOVENOUS FISTULA Right 07/16/2017   Procedure: REVISION OF ARTERIOVENOUS FISTULA  Right ARM;  Surgeon: Waynetta Sandy, MD;  Location: [place];  Service: Vascular;  Laterality: Right;   TOTAL HIP ARTHROPLASTY Left 11/14/2020   Procedure: LEFT TOTAL HIP ARTHROPLASTY ANTERIOR APPROACH;  Surgeon: Leandrew Koyanagi, MD;  Location: [place];  Service: Orthopedics;  Laterality: Left;   UPPER GASTROINTESTINAL ENDOSCOPY  2021   @ [facility]   Patient Active Problem List   Diagnosis Date Noted   Mucosal abnormality of esophagus    Mucosal abnormality of stomach    Mucosal abnormality of duodenum    Upper GI bleed    Left hip pain 11/16/2020   Type 2 diabetes mellitus with other specified complication ([place]) 91/63/8466   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/14/2020   Primary osteoarthritis of left hip 10/07/2020   Status post reverse total arthroplasty of right shoulder 08/24/2020   Diverticulitis 03/08/2020   ESRD on hemodialysis ([place]) 03/08/2020   Polyneuropathy in diseases classified elsewhere ([place]) 03/08/2020   Pure hypercholesterolemia 03/08/2020   Sacral back pain 03/08/2020   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 11/10/2019   Acute gastric ulcer with hemorrhage    Hypotension of hemodialysis 07/31/2019   Diverticulitis large intestine 07/31/2019   Mass of soft tissue of shoulder 05/26/2019   Awaiting organ transplant status 03/18/2019   Finding of cocaine in blood 02/25/2019   Anaphylactic shock, unspecified, sequela  11/21/2018   Hypercalcemia 10/28/2018   Diarrhea, unspecified 08/08/2018   Hyperkalemia 03/12/2018   Patient is Jehovah's Witness 01/30/2018   Renal cell carcinoma of left kidney ([place]) 12/24/2017   Fluid overload, unspecified 07/06/2017   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   Other specified disorders of kidney and ureter 11/22/2016   Secondary hyperparathyroidism of renal origin ([place]) 10/23/2016   Other headache syndrome 08/21/2016   TIA (transient ischemic attack)    Essential hypertension    Seizures ([place])    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    Lung nodule    Diverticulitis of cecum 07/21/2015   Diverticulitis of large intestine without perforation or abscess without bleeding    Right lower quadrant pain    Obstructive sleep apnea 02/08/2015   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   Diverticulosis 07/13/2014   Lower GI bleed 07/12/2014   Refusal of blood transfusions as patient is Jehovah's Witness    Parotid  mass    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   HLD (hyperlipidemia)    PRES (posterior reversible encephalopathy syndrome)    History of lacunar cerebrovascular accident (CVA) 05/23/2014   Anemia of renal disease 05/23/2014   GERD (gastroesophageal reflux disease) 12/09/2012   Gastrointestinal bleeding 11/12/2012   Melena 11/12/2012   Other psychoactive substance dependence, uncomplicated ([place]) 33/82/5053   Iron deficiency anemia, unspecified 06/10/2012   Moderate protein-calorie malnutrition ([place]) 07/29/2009   Coagulation defect, unspecified ([place]) 08/29/2006   Type 2 diabetes mellitus with diabetic peripheral angiopathy without gangrene ([place]) 08/29/2006   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 Dialysis  PRECAUTIONS: Dialysis 3x/wk  SUBJECTIVE: Pt reports that he was having some sharp pain, but it has eased off now.  PAIN:  Are you having pain? Yes NPRS scale: 2/10 Pain location: Left hip Pain orientation: Left  PAIN TYPE: sharp Pain description: intermittent  Aggravating factors: unknown Relieving factors: unknown   OBJECTIVE:   04/03/2021: A/ROM right shoulder measured in standing: flexion 95 degrees, 87 degrees abduction   MMT:  R shoulder strength of 3/5, B hips and knee strength of 4- to 4/5   TODAY'S TREATMENT:    04/06/2021 Nustep L5 x8 min with PT present to discuss status. Hip extension and abduction standing on foam pad with green loop 2x10 B Rocker board x1 min Leg Press 80# 2x10, seat at 9.  RLE then LLE only 50# 2x10 each Fwd and Lateral step up on step stool x10B Wall wash flexion and scaption x12 each RUE Lat pull 35# 2x10 Standing Rows 20# 2x10 UBE level 1.7 x3 min each way (fwd/back) with PT present to discuss status  Treatment on 04/03/2021 Nustep L5 x10 min with PT present to discuss status. Leg Press 80# 2x10, seat at 9.  RLE then LLE only 50# x10 each Hip extension and abduction standing on foam pad 2x10 B Rocker board x1 min Fwd and Lateral step up on step stool x10B Wall wash flexion and scaption x12 each RUE Rows and shoulder extension with blue theraband 2x10B UBE level 1.7 x3 min each way (fwd/back) with PT present to discuss status   Treatment on 03/29/21  Nustep: level 5x10 min Arm Bike: level 1.7x 6 mine (3/3 Standing:  Hip abduction on foam pad:  2x10 Hip extension on foam pad: 2x10 Step up: 8" 2x10 Shoulder extension and row: blue band: 2x10 Supine punches: 6: 2x10 Sidelying ER without weight 2x10 Seated cane flexion: x15   PATIENT EDUCATION: Education details: [id number] Person educated: Patient Education method: Explanation, Demonstration, and Handouts Education comprehension: verbalized understanding and returned demonstration   HOME EXERCISE PROGRAM: Access Code: [email or web address] URL: [email or web address] Date: 03/29/2021 Standing Hip Abduction with Counter Support - 1 x daily - 7 x weekly - 2 sets - 10 reps Standing Hip Extension with Counter Support - 1 x daily - 7 x weekly - 2 sets -  10 reps Forward Step Up - 1 x daily - 7 x weekly - 2 sets - 10 reps Standing Row with Anchored Resistance - 1 x daily - 7 x weekly - 2 sets - 10 reps Shoulder Extension with Resistance - 1 x daily - 7 x weekly - 2 sets - 10 reps     PT Short Term Goals - 04/03/21 1147                PT SHORT TERM GOAL #1    Title Pt will be independent with initial HEP.     Status Achieved                     PT Long Term Goals - 04/03/21 1147                PT LONG TERM GOAL #1    Title be independent in advanced HEP     Status On-going          PT LONG TERM GOAL #2    Title improve BLE strength to at least 4+/5 to negotiate steps without increased pain.     Status On-going          PT LONG TERM GOAL #3    Title Pt to increase R shoulder flexion and abduction to at least 115 degrees to allow him to reach into overhead cabinets     Status On-going          PT LONG TERM GOAL #4    Title Pt able to negotiate steps with reciprocal pattern with use of unilateral rail to  allow him to do laundry in his home.     Status On-going          PT LONG TERM GOAL #5    Title Increase FOTO to at least 63% to allow pt to perform activities with decreased complication.     Status Achieved                 ASSESSMENT:   CLINICAL  IMPRESSION:   Mr Krall is reporting having some pain earlier today, but reports feeling better at this time.  Pt is agreeable to treatment.  He was able to tolerate addition of weight machines today for there ex.  Able to add green loop for resistance with hip exercises on blue foam.  Pt continues to progress towards goal related activities.   PERSONAL FACTORS AND COMORBIDITIES:  L THA, R shoulder surgery, ESRD on hemodialysis.   Patient will benefit from skilled therapeutic intervention in order to improve on the following deficits:  Decreased range of motion; Difficulty walking; Increased muscle spasms; Impaired UE functional use; Pain; Impaired flexibility; Decreased strength   PT FREQUENCY: 2x/week   PT DURATION: 8 weeks   Rehab Potential:  Good   Stability/Clinical Decision Making:  Evolving/Moderate complexity   PLANNED INTERVENTIONS: Therapeutic exercises, Therapeutic activity, Neuro Muscular re-education, Balance training, Gait training, Patient/Family education, Joint mobilization, Stair training, Dry Needling, Cryotherapy, Moist heat, Taping, and Manual therapy   PLAN FOR NEXT SESSION: Continue to address strength, endurance and balance.  ERO next week to determine need for future PT.      Juel Burrow, PT 04/06/2021, 10:20 AM  [facility] [address] [place], [place] [address] Phone # [phone] Fax [phone]

## 2021-04-07 DIAGNOSIS — D631 Anemia in chronic kidney disease: Secondary | ICD-10-CM | POA: Diagnosis not present

## 2021-04-07 DIAGNOSIS — E875 Hyperkalemia: Secondary | ICD-10-CM | POA: Diagnosis not present

## 2021-04-07 DIAGNOSIS — Z992 Dependence on renal dialysis: Secondary | ICD-10-CM | POA: Diagnosis not present

## 2021-04-07 DIAGNOSIS — R197 Diarrhea, unspecified: Secondary | ICD-10-CM | POA: Diagnosis not present

## 2021-04-07 DIAGNOSIS — N186 End stage renal disease: Secondary | ICD-10-CM | POA: Diagnosis not present

## 2021-04-07 DIAGNOSIS — D689 Coagulation defect, unspecified: Secondary | ICD-10-CM | POA: Diagnosis not present

## 2021-04-07 DIAGNOSIS — Z7682 Awaiting organ transplant status: Secondary | ICD-10-CM | POA: Diagnosis not present

## 2021-04-07 DIAGNOSIS — N2581 Secondary hyperparathyroidism of renal origin: Secondary | ICD-10-CM | POA: Diagnosis not present

## 2021-04-07 DIAGNOSIS — D509 Iron deficiency anemia, unspecified: Secondary | ICD-10-CM | POA: Diagnosis not present

## 2021-04-10 ENCOUNTER — Ambulatory Visit: Payer: Medicare Other

## 2021-04-10 ENCOUNTER — Other Ambulatory Visit: Payer: Self-pay

## 2021-04-10 DIAGNOSIS — G8929 Other chronic pain: Secondary | ICD-10-CM

## 2021-04-10 DIAGNOSIS — M25611 Stiffness of right shoulder, not elsewhere classified: Secondary | ICD-10-CM

## 2021-04-10 DIAGNOSIS — M6281 Muscle weakness (generalized): Secondary | ICD-10-CM

## 2021-04-10 DIAGNOSIS — D631 Anemia in chronic kidney disease: Secondary | ICD-10-CM | POA: Diagnosis not present

## 2021-04-10 DIAGNOSIS — M25552 Pain in left hip: Secondary | ICD-10-CM | POA: Diagnosis not present

## 2021-04-10 DIAGNOSIS — E875 Hyperkalemia: Secondary | ICD-10-CM | POA: Diagnosis not present

## 2021-04-10 DIAGNOSIS — D689 Coagulation defect, unspecified: Secondary | ICD-10-CM | POA: Diagnosis not present

## 2021-04-10 DIAGNOSIS — M25562 Pain in left knee: Secondary | ICD-10-CM

## 2021-04-10 DIAGNOSIS — Z7682 Awaiting organ transplant status: Secondary | ICD-10-CM | POA: Diagnosis not present

## 2021-04-10 DIAGNOSIS — M25652 Stiffness of left hip, not elsewhere classified: Secondary | ICD-10-CM | POA: Diagnosis not present

## 2021-04-10 DIAGNOSIS — N186 End stage renal disease: Secondary | ICD-10-CM | POA: Diagnosis not present

## 2021-04-10 DIAGNOSIS — R2689 Other abnormalities of gait and mobility: Secondary | ICD-10-CM | POA: Diagnosis not present

## 2021-04-10 DIAGNOSIS — Z992 Dependence on renal dialysis: Secondary | ICD-10-CM | POA: Diagnosis not present

## 2021-04-10 DIAGNOSIS — N2581 Secondary hyperparathyroidism of renal origin: Secondary | ICD-10-CM | POA: Diagnosis not present

## 2021-04-10 DIAGNOSIS — M25561 Pain in right knee: Secondary | ICD-10-CM | POA: Diagnosis not present

## 2021-04-10 DIAGNOSIS — R197 Diarrhea, unspecified: Secondary | ICD-10-CM | POA: Diagnosis not present

## 2021-04-10 DIAGNOSIS — D509 Iron deficiency anemia, unspecified: Secondary | ICD-10-CM | POA: Diagnosis not present

## 2021-04-10 NOTE — Therapy (Signed)
OUTPATIENT PHYSICAL THERAPY TREATMENT NOTE   Patient Name: Carl Seib Sr. MRN: [id number] DOB:[birth year], 61 y.o., male 70 Date: 04/10/2021  PCP: [name], MD REFERRING PROVIDER: [name], MD   PT End of Session - 04/10/21 1054     Visit Number 17    Date for PT Re-Evaluation 06/02/21    Authorization Type [employer] Medicare    Progress Note Due on Visit 41    PT Start Time 1031   late due to dialysis today   PT Stop Time 1100    PT Time Calculation (min) 29 min    Activity Tolerance Patient tolerated treatment well    Behavior During Therapy WFL for tasks assessed/performed              Past Medical History:  Diagnosis Date   Acute gastric ulcer with hemorrhage    Acute GI bleeding 07/31/2019   Acute pericarditis    Anemia    hx of   Anxiety    situational    Arthritis    on meds   Borderline diabetes    Cataract    bilateral sx   Chronic headaches    Depression    situational    Diverticulitis    ESRD (end stage renal disease) ([place])     On Renal Transplant List," [facility]; MWF" (10/23/2016)   GERD (gastroesophageal reflux disease)    with certain foods   GI bleed    Hypertension    diet controlled   Parathyroid abnormality (HCC)    ectopic parathyroid gland   Presence of arteriovenous fistula for hemodialysis, primary ([place])    RUE   Refusal of blood product    NO WHOLE BLOOD PROUCTS   Renal cell carcinoma ([place])    s/p hand assisted laparoscopic bilateral nephrectomies 11/29/17, + RCC left   Secondary hyperparathyroidism ([place])    Seizures ([place])    one episode in past, due to" elevated Potassium" 08/02/20- "at least 4 years ago"   Sleep apnea    doesn't use CPAP anymore since weight loss   Stroke [facility])    no residual   Past Surgical History:  Procedure Laterality Date   AV FISTULA PLACEMENT Right    right arm   BIOPSY  08/01/2019   Procedure: BIOPSY;  Surgeon: Juanita Craver, MD;  Location: [facility] ENDOSCOPY;  Service: Endoscopy;;    BIOPSY  11/18/2020   Procedure: BIOPSY;  Surgeon: Daryel November, MD;  Location: [facility] ENDOSCOPY;  Service: Gastroenterology;;   CATARACT EXTRACTION W/ INTRAOCULAR LENS  IMPLANT, BILATERAL     COLON SURGERY     COLONOSCOPY N/A 08/04/2015   Procedure: COLONOSCOPY;  Surgeon: Jerene Bears, MD;  Location: [facility] ENDOSCOPY;  Service: Endoscopy;  Laterality: N/A;   COLONOSCOPY  2017   JMP@ [facility]-good prep-mass -recall 1 yr   COLONOSCOPY WITH PROPOFOL N/A 11/25/2020   Procedure: COLONOSCOPY WITH PROPOFOL;  Surgeon: Sharyn Creamer, MD;  Location: [place];  Service: Gastroenterology;  Laterality: N/A;   ESOPHAGOGASTRODUODENOSCOPY N/A 08/01/2019   Procedure: ESOPHAGOGASTRODUODENOSCOPY (EGD);  Surgeon: Juanita Craver, MD;  Location: [facility] ENDOSCOPY;  Service: Endoscopy;  Laterality: N/A;   ESOPHAGOGASTRODUODENOSCOPY N/A 11/18/2020   Procedure: ESOPHAGOGASTRODUODENOSCOPY (EGD);  Surgeon: Daryel November, MD;  Location: [place];  Service: Gastroenterology;  Laterality: N/A;   ESOPHAGOGASTRODUODENOSCOPY (EGD) WITH PROPOFOL N/A 08/04/2019   Procedure: ESOPHAGOGASTRODUODENOSCOPY (EGD) WITH PROPOFOL;  Surgeon: Yetta Flock, MD;  Location: [place];  Service: Gastroenterology;  Laterality: N/A;   graft left arm Left  for dialysis x 2. Removed   HOT HEMOSTASIS  11/18/2020   Procedure: HOT HEMOSTASIS (ARGON PLASMA COAGULATION/BICAP);  Surgeon: Daryel November, MD;  Location: [place];  Service: Gastroenterology;;   INSERTION OF DIALYSIS CATHETER     Rt chest   LAPAROSCOPIC RIGHT COLECTOMY N/A 08/05/2015   Procedure: LAPAROSCOPIC RIGHT COLECTOMY- ASCENDING;  Surgeon: Stark Klein, MD;  Location: [place];  Service: General;  Laterality: N/A;   MASS EXCISION Left 05/28/2019   Procedure: EXCISION SOFT TISSUE MASS LEFT SHOULDER;  Surgeon: Armandina Gemma, MD;  Location: [facility];  Service: General;  Laterality: Left;   NEPHRECTOMY Bilateral    PARATHYROIDECTOMY N/A 06/12/2016   Procedure: TOTAL  PARATHYROIDECTOMY WITH AUTOTRANSPLANTATION TO LEFT FOREARM;  Surgeon: Armandina Gemma, MD;  Location: [place];  Service: General;  Laterality: N/A;   PARATHYROIDECTOMY N/A 10/23/2016   Procedure: PARATHYROIDECTOMY;  Surgeon: Armandina Gemma, MD;  Location: [place];  Service: General;  Laterality: N/A;   REVERSE SHOULDER ARTHROPLASTY Right 08/24/2020   Procedure: REVERSE SHOULDER ARTHROPLASTY;  Surgeon: Hiram Gash, MD;  Location: [place];  Service: Orthopedics;  Laterality: Right;   REVISON OF ARTERIOVENOUS FISTULA Right 07/16/2017   Procedure: REVISION OF ARTERIOVENOUS FISTULA  Right ARM;  Surgeon: Waynetta Sandy, MD;  Location: [place];  Service: Vascular;  Laterality: Right;   TOTAL HIP ARTHROPLASTY Left 11/14/2020   Procedure: LEFT TOTAL HIP ARTHROPLASTY ANTERIOR APPROACH;  Surgeon: Leandrew Koyanagi, MD;  Location: [place];  Service: Orthopedics;  Laterality: Left;   UPPER GASTROINTESTINAL ENDOSCOPY  2021   @ [facility]   Patient Active Problem List   Diagnosis Date Noted   Mucosal abnormality of esophagus    Mucosal abnormality of stomach    Mucosal abnormality of duodenum    Upper GI bleed    Left hip pain 11/16/2020   Type 2 diabetes mellitus with other specified complication ([place]) 18/29/9371   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/14/2020   Primary osteoarthritis of left hip 10/07/2020   Status post reverse total arthroplasty of right shoulder 08/24/2020   Diverticulitis 03/08/2020   ESRD on hemodialysis ([place]) 03/08/2020   Polyneuropathy in diseases classified elsewhere ([place]) 03/08/2020   Pure hypercholesterolemia 03/08/2020   Sacral back pain 03/08/2020   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 11/10/2019   Acute gastric ulcer with hemorrhage    Hypotension of hemodialysis 07/31/2019   Diverticulitis large intestine 07/31/2019   Mass of soft tissue of shoulder 05/26/2019   Awaiting organ transplant status 03/18/2019   Finding of cocaine in blood 02/25/2019   Anaphylactic shock,  unspecified, sequela 11/21/2018   Hypercalcemia 10/28/2018   Diarrhea, unspecified 08/08/2018   Hyperkalemia 03/12/2018   Patient is Jehovah's Witness 01/30/2018   Renal cell carcinoma of left kidney ([place]) 12/24/2017   Fluid overload, unspecified 07/06/2017   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   Other specified disorders of kidney and ureter 11/22/2016   Secondary hyperparathyroidism of renal origin ([place]) 10/23/2016   Other headache syndrome 08/21/2016   TIA (transient ischemic attack)    Essential hypertension    Seizures ([place])    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    Lung nodule    Diverticulitis of cecum 07/21/2015   Diverticulitis of large intestine without perforation or abscess without bleeding    Right lower quadrant pain    Obstructive sleep apnea 02/08/2015   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   Diverticulosis 07/13/2014   Lower GI bleed 07/12/2014   Refusal of blood transfusions as patient  is Jehovah's Witness    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   HLD (hyperlipidemia)    PRES (posterior reversible encephalopathy syndrome)    History of lacunar cerebrovascular accident (CVA) 05/23/2014   Anemia of renal disease 05/23/2014   GERD (gastroesophageal reflux disease) 12/09/2012   Gastrointestinal bleeding 11/12/2012   Melena 11/12/2012   Other psychoactive substance dependence, uncomplicated ([place]) 33/82/5053   Iron deficiency anemia, unspecified 06/10/2012   Moderate protein-calorie malnutrition ([place]) 07/29/2009   Coagulation defect, unspecified ([place]) 08/29/2006   Type 2 diabetes mellitus with diabetic peripheral angiopathy without gangrene ([place]) 08/29/2006   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 Dialysis  PRECAUTIONS: Dialysis 3x/wk  SUBJECTIVE: I am late today because dialysis was delayed.  They lost power.    PAIN:  Are you having pain? Yes NPRS scale: 2/10 Pain location: Left hip Pain orientation: Left  PAIN TYPE: sharp Pain description: intermittent  Aggravating factors: unknown Relieving factors: unknown  OBJECTIVE:   04/03/2021: A/ROM right shoulder measured in standing: flexion 95 degrees, 87 degrees abduction   MMT:  R shoulder strength of 3/5, B hips and knee strength of 4- to 4/5   TODAY'S TREATMENT:   04/10/2021 Nustep L5 x8 min with PT present to discuss status. Leg Press 80# 2x10, seat at 9.  RLE then LLE only 50# 2x10 each Fwd and Lateral step up on step stool (metal 9" stool) 2 x10Bil Wall wash flexion and scaption x12 each RUE Standing Rows 20# 2x10 UBE level 1.7 x3 min each way (fwd/back) with PT present to discuss status  04/06/2021 Nustep L5 x8 min with PT present to discuss status. Hip extension and abduction standing on foam pad with green loop 2x10 B Rocker board x1 min Leg Press 80# 2x10, seat at 9.  RLE then LLE only 50# 2x10 each Fwd and Lateral step up on step stool x10B Wall wash flexion and scaption x12 each RUE Lat pull 35# 2x10 Standing Rows 20# 2x10 UBE level 1.7 x3 min each way (fwd/back) with PT present to discuss status  Treatment on 04/03/2021 Nustep L5 x10 min with PT present to discuss status. Leg Press 80# 2x10, seat at 9.  RLE then LLE only 50# x10 each Hip extension and abduction standing on foam pad 2x10 B Rocker board x1 min Fwd and Lateral step up on step stool x10B Wall wash flexion  and scaption x12 each RUE Rows and shoulder extension with blue theraband 2x10B UBE level 1.7 x3 min each way (fwd/back) with PT present to discuss status     PATIENT EDUCATION: Education details: [email or web address] Person educated: Patient Education method: Explanation, Demonstration, and Handouts Education comprehension: verbalized understanding and returned demonstration   HOME EXERCISE PROGRAM: Access Code: [email or web address] URL: [email or web address] Date: 03/29/2021 Standing Hip Abduction with Counter Support - 1 x daily - 7 x weekly - 2 sets - 10 reps Standing Hip Extension with Counter Support - 1 x daily - 7 x weekly -  2 sets - 10 reps Forward Step Up - 1 x daily - 7 x weekly - 2 sets - 10 reps Standing Row with Anchored Resistance - 1 x daily - 7 x weekly - 2 sets - 10 reps Shoulder Extension with Resistance - 1 x daily - 7 x weekly - 2 sets - 10 reps     PT Short Term Goals - 04/03/21 1147                PT SHORT TERM GOAL #1    Title Pt will be independent with initial HEP.     Status Achieved                     PT Long Term Goals - 04/03/21 1147                PT LONG TERM GOAL #1    Title be independent in advanced HEP     Status On-going          PT LONG TERM GOAL #2    Title improve BLE strength to at least 4+/5 to negotiate steps without increased pain.     Status On-going          PT LONG TERM GOAL #3    Title Pt to increase R shoulder flexion and abduction to at least 115 degrees to allow him to reach into overhead cabinets     Status On-going          PT LONG TERM GOAL #4    Title Pt able to negotiate steps with reciprocal pattern with use of unilateral rail to  allow him to do laundry in his home.     Status On-going          PT LONG TERM GOAL #5    Title Increase FOTO to at least 63% to allow pt to perform activities with decreased complication.     Status Achieved                 ASSESSMENT:   CLINICAL IMPRESSION:   Pt arrived late  today and session was shortened.  Pt is challenged by current level of exercise.  Pt requires supervision with all exercises and PT provided verbal cues for technique as needed.  Pt continues to report improved function and independence at home and will continue to benefit from skilled PT to address deficits.    PERSONAL FACTORS AND COMORBIDITIES:  L THA, R shoulder surgery, ESRD on hemodialysis.   Patient will benefit from skilled therapeutic intervention in order to improve on the following deficits:  Decreased range of motion; Difficulty walking; Increased muscle spasms; Impaired UE functional use; Pain; Impaired flexibility; Decreased strength   PT FREQUENCY: 2x/week   PT DURATION: 8 weeks   Rehab Potential:  Good   Stability/Clinical Decision Making:  Evolving/Moderate complexity   PLANNED INTERVENTIONS: Therapeutic exercises, Therapeutic activity, Neuro Muscular re-education, Balance training, Gait training, Patient/Family education, Joint mobilization, Stair training, Dry Needling, Cryotherapy, Moist heat, Taping, and Manual therapy   PLAN FOR NEXT SESSION: Continue to address strength, endurance and balance and advance as tolerated.    Sigurd Sos, PT 04/10/21 10:56 AM   [facility] [address] [place], [place] [address] Phone # [phone] Fax [phone]

## 2021-04-11 DIAGNOSIS — N186 End stage renal disease: Secondary | ICD-10-CM | POA: Diagnosis not present

## 2021-04-11 DIAGNOSIS — I12 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 stage renal disease: Secondary | ICD-10-CM | POA: Diagnosis not present

## 2021-04-11 DIAGNOSIS — Z992 Dependence on renal dialysis: Secondary | ICD-10-CM | POA: Diagnosis not present

## 2021-04-12 DIAGNOSIS — E875 Hyperkalemia: Secondary | ICD-10-CM | POA: Diagnosis not present

## 2021-04-12 DIAGNOSIS — N186 End stage renal disease: Secondary | ICD-10-CM | POA: Diagnosis not present

## 2021-04-12 DIAGNOSIS — Z992 Dependence on renal dialysis: Secondary | ICD-10-CM | POA: Diagnosis not present

## 2021-04-12 DIAGNOSIS — D689 Coagulation defect, unspecified: Secondary | ICD-10-CM | POA: Diagnosis not present

## 2021-04-12 DIAGNOSIS — Z7682 Awaiting organ transplant status: Secondary | ICD-10-CM | POA: Diagnosis not present

## 2021-04-12 DIAGNOSIS — D631 Anemia in chronic kidney disease: Secondary | ICD-10-CM | POA: Diagnosis not present

## 2021-04-12 DIAGNOSIS — D509 Iron deficiency anemia, unspecified: Secondary | ICD-10-CM | POA: Diagnosis not present

## 2021-04-12 DIAGNOSIS — N2581 Secondary hyperparathyroidism of renal origin: Secondary | ICD-10-CM | POA: Diagnosis not present

## 2021-04-13 ENCOUNTER — Encounter: Payer: Self-pay | Admitting: Rehabilitative and Restorative Service Providers"

## 2021-04-13 ENCOUNTER — Ambulatory Visit: Payer: Medicare Other | Attending: Family Medicine | Admitting: Rehabilitative and Restorative Service Providers"

## 2021-04-13 ENCOUNTER — Other Ambulatory Visit: Payer: Self-pay

## 2021-04-13 DIAGNOSIS — D631 Anemia in chronic kidney disease: Secondary | ICD-10-CM | POA: Diagnosis not present

## 2021-04-13 DIAGNOSIS — E875 Hyperkalemia: Secondary | ICD-10-CM | POA: Diagnosis not present

## 2021-04-13 DIAGNOSIS — M25552 Pain in left hip: Secondary | ICD-10-CM | POA: Diagnosis not present

## 2021-04-13 DIAGNOSIS — M25562 Pain in left knee: Secondary | ICD-10-CM

## 2021-04-13 DIAGNOSIS — N2581 Secondary hyperparathyroidism of renal origin: Secondary | ICD-10-CM | POA: Diagnosis not present

## 2021-04-13 DIAGNOSIS — M25561 Pain in right knee: Secondary | ICD-10-CM | POA: Diagnosis not present

## 2021-04-13 DIAGNOSIS — R2689 Other abnormalities of gait and mobility: Secondary | ICD-10-CM | POA: Diagnosis not present

## 2021-04-13 DIAGNOSIS — M25652 Stiffness of left hip, not elsewhere classified: Secondary | ICD-10-CM | POA: Insufficient documentation

## 2021-04-13 DIAGNOSIS — G8929 Other chronic pain: Secondary | ICD-10-CM | POA: Insufficient documentation

## 2021-04-13 DIAGNOSIS — D689 Coagulation defect, unspecified: Secondary | ICD-10-CM | POA: Diagnosis not present

## 2021-04-13 DIAGNOSIS — Z7682 Awaiting organ transplant status: Secondary | ICD-10-CM | POA: Diagnosis not present

## 2021-04-13 DIAGNOSIS — M25611 Stiffness of right shoulder, not elsewhere classified: Secondary | ICD-10-CM | POA: Insufficient documentation

## 2021-04-13 DIAGNOSIS — N186 End stage renal disease: Secondary | ICD-10-CM | POA: Diagnosis not present

## 2021-04-13 DIAGNOSIS — D509 Iron deficiency anemia, unspecified: Secondary | ICD-10-CM | POA: Diagnosis not present

## 2021-04-13 DIAGNOSIS — M6281 Muscle weakness (generalized): Secondary | ICD-10-CM | POA: Insufficient documentation

## 2021-04-13 DIAGNOSIS — Z992 Dependence on renal dialysis: Secondary | ICD-10-CM | POA: Diagnosis not present

## 2021-04-13 NOTE — Therapy (Signed)
OUTPATIENT PHYSICAL THERAPY TREATMENT NOTE   Patient Name: Carl Capote Sr. MRN: [id number] DOB:1960-08-05, 61 y.o., male 36 Date: 04/13/2021  PCP: [name], MD REFERRING PROVIDER: [name], MD   PT End of Session - 04/13/21 1021     Visit Number 18    Date for PT Re-Evaluation 06/02/21    Authorization Type [employer] Medicare    Progress Note Due on Visit 76    PT Start Time 1018    PT Stop Time 1056    PT Time Calculation (min) 38 min    Activity Tolerance Patient tolerated treatment well    Behavior During Therapy WFL for tasks assessed/performed              Past Medical History:  Diagnosis Date   Acute gastric ulcer with hemorrhage    Acute GI bleeding 07/31/2019   Acute pericarditis    Anemia    hx of   Anxiety    situational    Arthritis    on meds   Borderline diabetes    Cataract    bilateral sx   Chronic headaches    Depression    situational    Diverticulitis    ESRD (end stage renal disease) ([place])     On Renal Transplant List," [facility]; MWF" (10/23/2016)   GERD (gastroesophageal reflux disease)    with certain foods   GI bleed    Hypertension    diet controlled   Parathyroid abnormality (HCC)    ectopic parathyroid gland   Presence of arteriovenous fistula for hemodialysis, primary ([place])    RUE   Refusal of blood product    NO WHOLE BLOOD PROUCTS   Renal cell carcinoma ([place])    s/p hand assisted laparoscopic bilateral nephrectomies 11/29/17, + RCC left   Secondary hyperparathyroidism ([place])    Seizures ([place])    one episode in past, due to" elevated Potassium" 08/02/20- "at least 4 years ago"   Sleep apnea    doesn't use CPAP anymore since weight loss   Stroke [facility])    no residual   Past Surgical History:  Procedure Laterality Date   AV FISTULA PLACEMENT Right    right arm   BIOPSY  08/01/2019   Procedure: BIOPSY;  Surgeon: Juanita Craver, MD;  Location: [facility] ENDOSCOPY;  Service: Endoscopy;;   BIOPSY  11/18/2020   Procedure:  BIOPSY;  Surgeon: Daryel November, MD;  Location: [facility] ENDOSCOPY;  Service: Gastroenterology;;   CATARACT EXTRACTION W/ INTRAOCULAR LENS  IMPLANT, BILATERAL     COLON SURGERY     COLONOSCOPY N/A 08/04/2015   Procedure: COLONOSCOPY;  Surgeon: Jerene Bears, MD;  Location: [facility] ENDOSCOPY;  Service: Endoscopy;  Laterality: N/A;   COLONOSCOPY  2017   JMP@ [facility]-good prep-mass -recall 1 yr   COLONOSCOPY WITH PROPOFOL N/A 11/25/2020   Procedure: COLONOSCOPY WITH PROPOFOL;  Surgeon: Sharyn Creamer, MD;  Location: [place];  Service: Gastroenterology;  Laterality: N/A;   ESOPHAGOGASTRODUODENOSCOPY N/A 08/01/2019   Procedure: ESOPHAGOGASTRODUODENOSCOPY (EGD);  Surgeon: Juanita Craver, MD;  Location: [facility] ENDOSCOPY;  Service: Endoscopy;  Laterality: N/A;   ESOPHAGOGASTRODUODENOSCOPY N/A 11/18/2020   Procedure: ESOPHAGOGASTRODUODENOSCOPY (EGD);  Surgeon: Daryel November, MD;  Location: [place];  Service: Gastroenterology;  Laterality: N/A;   ESOPHAGOGASTRODUODENOSCOPY (EGD) WITH PROPOFOL N/A 08/04/2019   Procedure: ESOPHAGOGASTRODUODENOSCOPY (EGD) WITH PROPOFOL;  Surgeon: Yetta Flock, MD;  Location: [place];  Service: Gastroenterology;  Laterality: N/A;   graft left arm Left    for dialysis x 2.  Removed   HOT HEMOSTASIS  11/18/2020   Procedure: HOT HEMOSTASIS (ARGON PLASMA COAGULATION/BICAP);  Surgeon: Daryel November, MD;  Location: [place];  Service: Gastroenterology;;   INSERTION OF DIALYSIS CATHETER     Rt chest   LAPAROSCOPIC RIGHT COLECTOMY N/A 08/05/2015   Procedure: LAPAROSCOPIC RIGHT COLECTOMY- ASCENDING;  Surgeon: Stark Klein, MD;  Location: [place];  Service: General;  Laterality: N/A;   MASS EXCISION Left 05/28/2019   Procedure: EXCISION SOFT TISSUE MASS LEFT SHOULDER;  Surgeon: Armandina Gemma, MD;  Location: [facility];  Service: General;  Laterality: Left;   NEPHRECTOMY Bilateral    PARATHYROIDECTOMY N/A 06/12/2016   Procedure: TOTAL PARATHYROIDECTOMY WITH  AUTOTRANSPLANTATION TO LEFT FOREARM;  Surgeon: Armandina Gemma, MD;  Location: [place];  Service: General;  Laterality: N/A;   PARATHYROIDECTOMY N/A 10/23/2016   Procedure: PARATHYROIDECTOMY;  Surgeon: Armandina Gemma, MD;  Location: [place];  Service: General;  Laterality: N/A;   REVERSE SHOULDER ARTHROPLASTY Right 08/24/2020   Procedure: REVERSE SHOULDER ARTHROPLASTY;  Surgeon: Hiram Gash, MD;  Location: [place];  Service: Orthopedics;  Laterality: Right;   REVISON OF ARTERIOVENOUS FISTULA Right 07/16/2017   Procedure: REVISION OF ARTERIOVENOUS FISTULA  Right ARM;  Surgeon: Waynetta Sandy, MD;  Location: [place];  Service: Vascular;  Laterality: Right;   TOTAL HIP ARTHROPLASTY Left 11/14/2020   Procedure: LEFT TOTAL HIP ARTHROPLASTY ANTERIOR APPROACH;  Surgeon: Leandrew Koyanagi, MD;  Location: [place];  Service: Orthopedics;  Laterality: Left;   UPPER GASTROINTESTINAL ENDOSCOPY  2021   @ [facility]   Patient Active Problem List   Diagnosis Date Noted   Mucosal abnormality of esophagus    Mucosal abnormality of stomach    Mucosal abnormality of duodenum    Upper GI bleed    Left hip pain 11/16/2020   Type 2 diabetes mellitus with other specified complication ([place]) 59/93/5701   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/14/2020   Primary osteoarthritis of left hip 10/07/2020   Status post reverse total arthroplasty of right shoulder 08/24/2020   Diverticulitis 03/08/2020   ESRD on hemodialysis ([place]) 03/08/2020   Polyneuropathy in diseases classified elsewhere ([place]) 03/08/2020   Pure hypercholesterolemia 03/08/2020   Sacral back pain 03/08/2020   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 11/10/2019   Acute gastric ulcer with hemorrhage    Hypotension of hemodialysis 07/31/2019   Diverticulitis large intestine 07/31/2019   Mass of soft tissue of shoulder 05/26/2019   Awaiting organ transplant status 03/18/2019   Finding of cocaine in blood 02/25/2019   Anaphylactic shock, unspecified, sequela  11/21/2018   Hypercalcemia 10/28/2018   Diarrhea, unspecified 08/08/2018   Hyperkalemia 03/12/2018   Patient is Jehovah's Witness 01/30/2018   Renal cell carcinoma of left kidney ([place]) 12/24/2017   Fluid overload, unspecified 07/06/2017   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   Other specified disorders of kidney and ureter 11/22/2016   Secondary hyperparathyroidism of renal origin ([place]) 10/23/2016   Other headache syndrome 08/21/2016   TIA (transient ischemic attack)    Essential hypertension    Seizures ([place])    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    Lung nodule    Diverticulitis of cecum 07/21/2015   Diverticulitis of large intestine without perforation or abscess without bleeding    Right lower quadrant pain    Obstructive sleep apnea 02/08/2015   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   Diverticulosis 07/13/2014   Lower GI bleed 07/12/2014   Refusal of blood transfusions as patient is Jehovah's Witness  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   HLD (hyperlipidemia)    PRES (posterior reversible encephalopathy syndrome)    History of lacunar cerebrovascular accident (CVA) 05/23/2014   Anemia of renal disease 05/23/2014   GERD (gastroesophageal reflux disease) 12/09/2012   Gastrointestinal bleeding 11/12/2012   Melena 11/12/2012   Other psychoactive substance dependence, uncomplicated ([place]) 40/81/4481   Iron deficiency anemia, unspecified 06/10/2012   Moderate protein-calorie malnutrition ([place]) 07/29/2009   Coagulation defect, unspecified ([place]) 08/29/2006   Type 2 diabetes mellitus with diabetic peripheral angiopathy without gangrene ([place]) 08/29/2006   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 Dialysis  PRECAUTIONS: Dialysis 3x/wk  SUBJECTIVE: I am late today because dialysis was delayed.  They lost power.    PAIN:  Are you having pain? Yes NPRS scale: 2/10 Pain location: Left hip Pain orientation: Left  PAIN TYPE: sharp Pain description: intermittent  Aggravating factors: unknown Relieving factors: unknown  OBJECTIVE:   04/03/2021: A/ROM right shoulder measured in standing: flexion 95 degrees, 87 degrees abduction   MMT:  R shoulder strength of 3/5, B hips and knee strength of 4- to 4/5   TODAY'S TREATMENT:   04/13/2021 Nustep L5 x8 min with PT present to discuss status Leg Press 80# 2x10, seat at 9.  RLE then LLE only 50# 2x10 each Fwd and Lateral step up on step stool (metal 9" stool) 2 x10 Bil Wall wash flexion and scaption x12 each RUE Lat pull 35# 2x10 in standing Standing Rows 20# 2x10 UBE level 1.7 x3 min each way (fwd/back) with PT present to discuss status  04/10/2021 Nustep L5 x8 min with PT present to discuss status. Leg Press 80# 2x10, seat at 9.  RLE then LLE only 50# 2x10 each Fwd and Lateral step up on step stool (metal 9" stool) 2 x10 Bil Wall wash flexion and scaption x12 each RUE Standing Rows 20# 2x10 UBE level 1.7 x3 min each way (fwd/back) with PT present to discuss status  04/06/2021 Nustep L5 x8 min with PT present to discuss status. Hip extension and abduction standing on foam pad with green loop 2x10 B Rocker board x1 min Leg Press 80# 2x10, seat at 9.  RLE then LLE only 50# 2x10 each Fwd and Lateral step up on step stool x10B Wall wash flexion and  scaption x12 each RUE Lat pull 35# 2x10 Standing Rows 20# 2x10 UBE level 1.7 x3 min each way (fwd/back) with PT present to discuss status    PATIENT EDUCATION: Education details: [id number] Person educated: Patient Education method: Explanation, Demonstration, and Handouts Education comprehension: verbalized understanding and returned demonstration   HOME EXERCISE PROGRAM: Access Code: [email or web address] URL: [email or web address] Date: 03/29/2021 Standing Hip Abduction with Counter Support - 1 x daily - 7 x weekly - 2 sets - 10 reps Standing Hip Extension with Counter Support - 1 x daily - 7 x weekly - 2  sets - 10 reps Forward Step Up - 1 x daily - 7 x weekly - 2 sets - 10 reps Standing Row with Anchored Resistance - 1 x daily - 7 x weekly - 2 sets - 10 reps Shoulder Extension with Resistance - 1 x daily - 7 x weekly - 2 sets - 10 reps     PT Short Term Goals - 04/03/21 1147                PT SHORT TERM GOAL #1    Title Pt will be independent with initial HEP.     Status Achieved                     PT Long Term Goals - 04/03/21 1147                PT LONG TERM GOAL #1    Title be independent in advanced HEP     Status On-going          PT LONG TERM GOAL #2    Title improve BLE strength to at least 4+/5 to negotiate steps without increased pain.     Status On-going          PT LONG TERM GOAL #3    Title Pt to increase R shoulder flexion and abduction to at least 115 degrees to allow him to reach into overhead cabinets     Status On-going          PT LONG TERM GOAL #4    Title Pt able to negotiate steps with reciprocal pattern with use of unilateral rail to  allow him to do laundry in his home.     Status On-going          PT LONG TERM GOAL #5    Title Increase FOTO to at least 63% to allow pt to perform activities with decreased complication.     Status Achieved                 ASSESSMENT:   CLINICAL IMPRESSION:   Pt arrived to session and  agreeable to receiving care.  Pt tolerated session well and without complaints of increased pain.  Pt reports that he is feeling a lot better and able to move his body more; reports feeling 70% better since starting PT. Pt with improved A/ROM noted during session and while performing exercises.  Pt continues to require skilled PT to progress towards goal related activities.   PERSONAL FACTORS AND COMORBIDITIES:  L THA, R shoulder surgery, ESRD on hemodialysis.   Patient will benefit from skilled therapeutic intervention in order to improve on the following deficits:  Decreased range of motion; Difficulty walking; Increased muscle spasms; Impaired UE functional use; Pain; Impaired flexibility; Decreased strength   PT FREQUENCY: 2x/week   PT DURATION: 8 weeks   Rehab Potential:  Good   Stability/Clinical Decision Making:  Evolving/Moderate complexity   PLANNED INTERVENTIONS: Therapeutic exercises, Therapeutic activity, Neuro Muscular re-education, Balance training, Gait training, Patient/Family education, Joint mobilization, Stair training, Dry Needling, Cryotherapy, Moist heat, Taping, and Manual therapy   PLAN FOR NEXT SESSION: Continue to address strength, endurance and balance and advance as tolerated.    Juel Burrow, PT 04/13/21 10:57 AM   [facility] [address] [place], [place] [address] Phone # [phone] Fax [phone]

## 2021-04-14 DIAGNOSIS — N186 End stage renal disease: Secondary | ICD-10-CM | POA: Diagnosis not present

## 2021-04-14 DIAGNOSIS — D689 Coagulation defect, unspecified: Secondary | ICD-10-CM | POA: Diagnosis not present

## 2021-04-14 DIAGNOSIS — D509 Iron deficiency anemia, unspecified: Secondary | ICD-10-CM | POA: Diagnosis not present

## 2021-04-14 DIAGNOSIS — D631 Anemia in chronic kidney disease: Secondary | ICD-10-CM | POA: Diagnosis not present

## 2021-04-14 DIAGNOSIS — Z992 Dependence on renal dialysis: Secondary | ICD-10-CM | POA: Diagnosis not present

## 2021-04-14 DIAGNOSIS — E875 Hyperkalemia: Secondary | ICD-10-CM | POA: Diagnosis not present

## 2021-04-14 DIAGNOSIS — Z7682 Awaiting organ transplant status: Secondary | ICD-10-CM | POA: Diagnosis not present

## 2021-04-14 DIAGNOSIS — N2581 Secondary hyperparathyroidism of renal origin: Secondary | ICD-10-CM | POA: Diagnosis not present

## 2021-04-17 DIAGNOSIS — E875 Hyperkalemia: Secondary | ICD-10-CM | POA: Diagnosis not present

## 2021-04-17 DIAGNOSIS — D509 Iron deficiency anemia, unspecified: Secondary | ICD-10-CM | POA: Diagnosis not present

## 2021-04-17 DIAGNOSIS — Z7682 Awaiting organ transplant status: Secondary | ICD-10-CM | POA: Diagnosis not present

## 2021-04-17 DIAGNOSIS — N186 End stage renal disease: Secondary | ICD-10-CM | POA: Diagnosis not present

## 2021-04-17 DIAGNOSIS — D689 Coagulation defect, unspecified: Secondary | ICD-10-CM | POA: Diagnosis not present

## 2021-04-17 DIAGNOSIS — N2581 Secondary hyperparathyroidism of renal origin: Secondary | ICD-10-CM | POA: Diagnosis not present

## 2021-04-17 DIAGNOSIS — D631 Anemia in chronic kidney disease: Secondary | ICD-10-CM | POA: Diagnosis not present

## 2021-04-17 DIAGNOSIS — Z992 Dependence on renal dialysis: Secondary | ICD-10-CM | POA: Diagnosis not present

## 2021-04-19 ENCOUNTER — Encounter: Payer: Self-pay | Admitting: Rehabilitative and Restorative Service Providers"

## 2021-04-19 ENCOUNTER — Ambulatory Visit: Payer: Medicare Other | Admitting: Rehabilitative and Restorative Service Providers"

## 2021-04-19 ENCOUNTER — Other Ambulatory Visit: Payer: Self-pay

## 2021-04-19 DIAGNOSIS — M6281 Muscle weakness (generalized): Secondary | ICD-10-CM

## 2021-04-19 DIAGNOSIS — M25552 Pain in left hip: Secondary | ICD-10-CM

## 2021-04-19 DIAGNOSIS — E875 Hyperkalemia: Secondary | ICD-10-CM | POA: Diagnosis not present

## 2021-04-19 DIAGNOSIS — Z992 Dependence on renal dialysis: Secondary | ICD-10-CM | POA: Diagnosis not present

## 2021-04-19 DIAGNOSIS — M25611 Stiffness of right shoulder, not elsewhere classified: Secondary | ICD-10-CM | POA: Diagnosis not present

## 2021-04-19 DIAGNOSIS — M25562 Pain in left knee: Secondary | ICD-10-CM | POA: Diagnosis not present

## 2021-04-19 DIAGNOSIS — Z7682 Awaiting organ transplant status: Secondary | ICD-10-CM | POA: Diagnosis not present

## 2021-04-19 DIAGNOSIS — R2689 Other abnormalities of gait and mobility: Secondary | ICD-10-CM

## 2021-04-19 DIAGNOSIS — D689 Coagulation defect, unspecified: Secondary | ICD-10-CM | POA: Diagnosis not present

## 2021-04-19 DIAGNOSIS — D509 Iron deficiency anemia, unspecified: Secondary | ICD-10-CM | POA: Diagnosis not present

## 2021-04-19 DIAGNOSIS — G8929 Other chronic pain: Secondary | ICD-10-CM

## 2021-04-19 DIAGNOSIS — M25652 Stiffness of left hip, not elsewhere classified: Secondary | ICD-10-CM | POA: Diagnosis not present

## 2021-04-19 DIAGNOSIS — D631 Anemia in chronic kidney disease: Secondary | ICD-10-CM | POA: Diagnosis not present

## 2021-04-19 DIAGNOSIS — N2581 Secondary hyperparathyroidism of renal origin: Secondary | ICD-10-CM | POA: Diagnosis not present

## 2021-04-19 DIAGNOSIS — N186 End stage renal disease: Secondary | ICD-10-CM | POA: Diagnosis not present

## 2021-04-19 DIAGNOSIS — M25561 Pain in right knee: Secondary | ICD-10-CM | POA: Diagnosis not present

## 2021-04-19 NOTE — Therapy (Signed)
OUTPATIENT PHYSICAL THERAPY TREATMENT NOTE   Patient Name: Carl Uzelac Sr. MRN: [id number] DOB:December 24, 1960, 61 y.o., male Today's Date: 04/19/2021  PCP: [name], MD REFERRING PROVIDER: [name], MD   PT End of Session - 04/19/21 1019     Visit Number 19    Date for PT Re-Evaluation 06/02/21    Authorization Type [employer] Medicare    Progress Note Due on Visit 20    PT Start Time 1015    PT Stop Time 1055    PT Time Calculation (min) 40 min    Activity Tolerance Patient tolerated treatment well    Behavior During Therapy WFL for tasks assessed/performed              Past Medical History:  Diagnosis Date   Acute gastric ulcer with hemorrhage    Acute GI bleeding 07/31/2019   Acute pericarditis    Anemia    hx of   Anxiety    situational    Arthritis    on meds   Borderline diabetes    Cataract    bilateral sx   Chronic headaches    Depression    situational    Diverticulitis    ESRD (end stage renal disease) ([place])     On Renal Transplant List," [facility]; MWF" (10/23/2016)   GERD (gastroesophageal reflux disease)    with certain foods   GI bleed    Hypertension    diet controlled   Parathyroid abnormality (HCC)    ectopic parathyroid gland   Presence of arteriovenous fistula for hemodialysis, primary ([place])    RUE   Refusal of blood product    NO WHOLE BLOOD PROUCTS   Renal cell carcinoma ([place])    s/p hand assisted laparoscopic bilateral nephrectomies 11/29/17, + RCC left   Secondary hyperparathyroidism ([place])    Seizures ([place])    one episode in past, due to" elevated Potassium" 08/02/20- "at least 4 years ago"   Sleep apnea    doesn't use CPAP anymore since weight loss   Stroke [facility])    no residual   Past Surgical History:  Procedure Laterality Date   AV FISTULA PLACEMENT Right    right arm   BIOPSY  08/01/2019   Procedure: BIOPSY;  Surgeon: Juanita Craver, MD;  Location: [facility] ENDOSCOPY;  Service: Endoscopy;;   BIOPSY  11/18/2020   Procedure:  BIOPSY;  Surgeon: Daryel November, MD;  Location: [facility] ENDOSCOPY;  Service: Gastroenterology;;   CATARACT EXTRACTION W/ INTRAOCULAR LENS  IMPLANT, BILATERAL     COLON SURGERY     COLONOSCOPY N/A 08/04/2015   Procedure: COLONOSCOPY;  Surgeon: Jerene Bears, MD;  Location: [facility] ENDOSCOPY;  Service: Endoscopy;  Laterality: N/A;   COLONOSCOPY  2017   JMP@ [facility]-good prep-mass -recall 1 yr   COLONOSCOPY WITH PROPOFOL N/A 11/25/2020   Procedure: COLONOSCOPY WITH PROPOFOL;  Surgeon: Sharyn Creamer, MD;  Location: [place];  Service: Gastroenterology;  Laterality: N/A;   ESOPHAGOGASTRODUODENOSCOPY N/A 08/01/2019   Procedure: ESOPHAGOGASTRODUODENOSCOPY (EGD);  Surgeon: Juanita Craver, MD;  Location: [facility] ENDOSCOPY;  Service: Endoscopy;  Laterality: N/A;   ESOPHAGOGASTRODUODENOSCOPY N/A 11/18/2020   Procedure: ESOPHAGOGASTRODUODENOSCOPY (EGD);  Surgeon: Daryel November, MD;  Location: [place];  Service: Gastroenterology;  Laterality: N/A;   ESOPHAGOGASTRODUODENOSCOPY (EGD) WITH PROPOFOL N/A 08/04/2019   Procedure: ESOPHAGOGASTRODUODENOSCOPY (EGD) WITH PROPOFOL;  Surgeon: Yetta Flock, MD;  Location: [place];  Service: Gastroenterology;  Laterality: N/A;   graft left arm Left    for dialysis x 2.  Removed   HOT HEMOSTASIS  11/18/2020   Procedure: HOT HEMOSTASIS (ARGON PLASMA COAGULATION/BICAP);  Surgeon: Daryel November, MD;  Location: [place];  Service: Gastroenterology;;   INSERTION OF DIALYSIS CATHETER     Rt chest   LAPAROSCOPIC RIGHT COLECTOMY N/A 08/05/2015   Procedure: LAPAROSCOPIC RIGHT COLECTOMY- ASCENDING;  Surgeon: Stark Klein, MD;  Location: [place];  Service: General;  Laterality: N/A;   MASS EXCISION Left 05/28/2019   Procedure: EXCISION SOFT TISSUE MASS LEFT SHOULDER;  Surgeon: Armandina Gemma, MD;  Location: [facility];  Service: General;  Laterality: Left;   NEPHRECTOMY Bilateral    PARATHYROIDECTOMY N/A 06/12/2016   Procedure: TOTAL PARATHYROIDECTOMY WITH  AUTOTRANSPLANTATION TO LEFT FOREARM;  Surgeon: Armandina Gemma, MD;  Location: [place];  Service: General;  Laterality: N/A;   PARATHYROIDECTOMY N/A 10/23/2016   Procedure: PARATHYROIDECTOMY;  Surgeon: Armandina Gemma, MD;  Location: [place];  Service: General;  Laterality: N/A;   REVERSE SHOULDER ARTHROPLASTY Right 08/24/2020   Procedure: REVERSE SHOULDER ARTHROPLASTY;  Surgeon: Hiram Gash, MD;  Location: [place];  Service: Orthopedics;  Laterality: Right;   REVISON OF ARTERIOVENOUS FISTULA Right 07/16/2017   Procedure: REVISION OF ARTERIOVENOUS FISTULA  Right ARM;  Surgeon: Waynetta Sandy, MD;  Location: [place];  Service: Vascular;  Laterality: Right;   TOTAL HIP ARTHROPLASTY Left 11/14/2020   Procedure: LEFT TOTAL HIP ARTHROPLASTY ANTERIOR APPROACH;  Surgeon: Leandrew Koyanagi, MD;  Location: [place];  Service: Orthopedics;  Laterality: Left;   UPPER GASTROINTESTINAL ENDOSCOPY  2021   @ [facility]   Patient Active Problem List   Diagnosis Date Noted   Mucosal abnormality of esophagus    Mucosal abnormality of stomach    Mucosal abnormality of duodenum    Upper GI bleed    Left hip pain 11/16/2020   Type 2 diabetes mellitus with other specified complication ([place]) 59/93/5701   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/14/2020   Primary osteoarthritis of left hip 10/07/2020   Status post reverse total arthroplasty of right shoulder 08/24/2020   Diverticulitis 03/08/2020   ESRD on hemodialysis ([place]) 03/08/2020   Polyneuropathy in diseases classified elsewhere ([place]) 03/08/2020   Pure hypercholesterolemia 03/08/2020   Sacral back pain 03/08/2020   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 11/10/2019   Acute gastric ulcer with hemorrhage    Hypotension of hemodialysis 07/31/2019   Diverticulitis large intestine 07/31/2019   Mass of soft tissue of shoulder 05/26/2019   Awaiting organ transplant status 03/18/2019   Finding of cocaine in blood 02/25/2019   Anaphylactic shock, unspecified, sequela  11/21/2018   Hypercalcemia 10/28/2018   Diarrhea, unspecified 08/08/2018   Hyperkalemia 03/12/2018   Patient is Jehovah's Witness 01/30/2018   Renal cell carcinoma of left kidney ([place]) 12/24/2017   Fluid overload, unspecified 07/06/2017   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   Other specified disorders of kidney and ureter 11/22/2016   Secondary hyperparathyroidism of renal origin ([place]) 10/23/2016   Other headache syndrome 08/21/2016   TIA (transient ischemic attack)    Essential hypertension    Seizures ([place])    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    Lung nodule    Diverticulitis of cecum 07/21/2015   Diverticulitis of large intestine without perforation or abscess without bleeding    Right lower quadrant pain    Obstructive sleep apnea 02/08/2015   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   Diverticulosis 07/13/2014   Lower GI bleed 07/12/2014   Refusal of blood transfusions as patient is Jehovah's Witness  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   HLD (hyperlipidemia)    PRES (posterior reversible encephalopathy syndrome)    History of lacunar cerebrovascular accident (CVA) 05/23/2014   Anemia of renal disease 05/23/2014   GERD (gastroesophageal reflux disease) 12/09/2012   Gastrointestinal bleeding 11/12/2012   Melena 11/12/2012   Other psychoactive substance dependence, uncomplicated ([place]) 37/16/9678   Iron deficiency anemia, unspecified 06/10/2012   Moderate protein-calorie malnutrition ([place]) 07/29/2009   Coagulation defect, unspecified ([place]) 08/29/2006   Type 2 diabetes mellitus with diabetic peripheral angiopathy without gangrene ([place]) 08/29/2006   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 Dialysis  PRECAUTIONS: Dialysis 3x/wk  SUBJECTIVE: Pt reports having some increased shoulder pain from doing some pressure washing over the weekend.  PAIN:  Are you having pain? Yes NPRS scale: 6-7/10 Pain location: R shoulder Pain orientation: Right  PAIN TYPE: sharp Pain description: intermittent  Aggravating factors: unknown Relieving factors: unknown  OBJECTIVE:   04/03/2021: A/ROM right shoulder measured in standing: flexion 95 degrees, 87 degrees abduction   MMT:  R shoulder strength of 3/5, B hips and knee strength of 4- to 4/5   TODAY'S TREATMENT:   3/8/20223: Nustep L5 x8 min with PT present to discuss status Leg Press 100# 2x10, seat at 9.  RLE then LLE only 60# 2x10 each UBE level 2.0 x3 min each way (fwd/back) with PT present to discuss status Standing Rows 20# 2x10 Lat pull 40# 2x10 in standing Fwd and Lateral step up on step stool (metal 9" stool) x10 Bil Sit to/from stand sitting on blue cushion holding 5# kettlebell.  X10 with chest press, x10 with overhead press. Wall push ups 2x10  04/13/2021 Nustep L5 x8 min with PT present to discuss status Leg Press 80# 2x10, seat at 9.  RLE then LLE only 50# 2x10 each Fwd and Lateral step up on step stool (metal 9" stool) 2 x10 Bil Wall wash flexion and scaption x12 each RUE Lat pull 35# 2x10 in standing Standing Rows 20# 2x10 UBE level 1.7 x3 min each way (fwd/back) with PT present to discuss status  04/10/2021 Nustep L5 x8 min with PT present to discuss status. Leg Press 80# 2x10, seat at 9.  RLE then LLE only 50# 2x10 each Fwd and  Lateral step up on step stool (metal 9" stool) 2 x10 Bil Wall wash flexion and scaption x12 each RUE Standing Rows 20# 2x10 UBE level 1.7 x3 min each way (fwd/back) with PT present to discuss status    PATIENT EDUCATION: Education details: [id number] Person educated: Patient Education method: Explanation, Demonstration, and Handouts Education comprehension: verbalized understanding and returned demonstration   HOME EXERCISE PROGRAM: Access Code: [email or web address] URL: [email or web address] Date: 03/29/2021 Standing Hip Abduction with Counter Support - 1 x daily - 7 x weekly - 2 sets - 10 reps Standing Hip Extension with Counter Support - 1 x daily -  7 x weekly - 2 sets - 10 reps Forward Step Up - 1 x daily - 7 x weekly - 2 sets - 10 reps Standing Row with Anchored Resistance - 1 x daily - 7 x weekly - 2 sets - 10 reps Shoulder Extension with Resistance - 1 x daily - 7 x weekly - 2 sets - 10 reps     PT Short Term Goals - 04/03/21 1147                PT SHORT TERM GOAL #1    Title Pt will be independent with initial HEP.     Status Achieved                     PT Long Term Goals - 04/03/21 1147                PT LONG TERM GOAL #1    Title be independent in advanced HEP     Status On-going          PT LONG TERM GOAL #2    Title improve BLE strength to at least 4+/5 to negotiate steps without increased pain.     Status On-going          PT LONG TERM GOAL #3    Title Pt to increase R shoulder flexion and abduction to at least 115 degrees to allow him to reach into overhead cabinets     Status On-going          PT LONG TERM GOAL #4    Title Pt able to negotiate steps with reciprocal pattern with use of unilateral rail to  allow him to do laundry in his home.     Status On-going          PT LONG TERM GOAL #5    Title Increase FOTO to at least 63% to allow pt to perform activities with decreased complication.     Status Achieved                  ASSESSMENT:   CLINICAL IMPRESSION:   Pt arrived to session and agreeable to receiving care.  Pt continues to require use of unilateral UE pushing from chair during sit to/from stand, even with sitting on one foam cushion.  Pt with improved overhead reach during overhead press with 5# kettlebell.  Pt continues to require skilled PT to progress towards goal related activities and increased functional ROM and strength.   PERSONAL FACTORS AND COMORBIDITIES:  L THA, R shoulder surgery, ESRD on hemodialysis.   Patient will benefit from skilled therapeutic intervention in order to improve on the following deficits:  Decreased range of motion; Difficulty walking; Increased muscle spasms; Impaired UE functional use; Pain; Impaired flexibility; Decreased strength   PT FREQUENCY: 2x/week   PT DURATION: 8 weeks   Rehab Potential:  Good   Stability/Clinical Decision Making:  Evolving/Moderate complexity   PLANNED INTERVENTIONS: Therapeutic exercises, Therapeutic activity, Neuro Muscular re-education, Balance training, Gait training, Patient/Family education, Joint mobilization, Stair training, Dry Needling, Cryotherapy, Moist heat, Taping, and Manual therapy   PLAN FOR NEXT SESSION: Continue to address strength, endurance and balance and advance as tolerated.    Juel Burrow, PT 04/19/21 10:55 AM   [facility] [address], [place], [place] [address] Phone # [phone] Fax [phone]

## 2021-04-20 ENCOUNTER — Other Ambulatory Visit: Payer: Self-pay

## 2021-04-20 DIAGNOSIS — N186 End stage renal disease: Secondary | ICD-10-CM

## 2021-04-20 DIAGNOSIS — K250 Acute gastric ulcer with hemorrhage: Secondary | ICD-10-CM

## 2021-04-20 DIAGNOSIS — D631 Anemia in chronic kidney disease: Secondary | ICD-10-CM

## 2021-04-20 DIAGNOSIS — Z1211 Encounter for screening for malignant neoplasm of colon: Secondary | ICD-10-CM

## 2021-04-21 ENCOUNTER — Ambulatory Visit: Payer: Medicare Other | Admitting: Rehabilitative and Restorative Service Providers"

## 2021-04-21 DIAGNOSIS — N186 End stage renal disease: Secondary | ICD-10-CM | POA: Diagnosis not present

## 2021-04-21 DIAGNOSIS — Z7682 Awaiting organ transplant status: Secondary | ICD-10-CM | POA: Diagnosis not present

## 2021-04-21 DIAGNOSIS — D631 Anemia in chronic kidney disease: Secondary | ICD-10-CM | POA: Diagnosis not present

## 2021-04-21 DIAGNOSIS — D509 Iron deficiency anemia, unspecified: Secondary | ICD-10-CM | POA: Diagnosis not present

## 2021-04-21 DIAGNOSIS — D689 Coagulation defect, unspecified: Secondary | ICD-10-CM | POA: Diagnosis not present

## 2021-04-21 DIAGNOSIS — E875 Hyperkalemia: Secondary | ICD-10-CM | POA: Diagnosis not present

## 2021-04-21 DIAGNOSIS — Z992 Dependence on renal dialysis: Secondary | ICD-10-CM | POA: Diagnosis not present

## 2021-04-21 DIAGNOSIS — N2581 Secondary hyperparathyroidism of renal origin: Secondary | ICD-10-CM | POA: Diagnosis not present

## 2021-04-24 ENCOUNTER — Other Ambulatory Visit: Payer: Self-pay

## 2021-04-24 ENCOUNTER — Ambulatory Visit: Payer: Medicare Other

## 2021-04-24 DIAGNOSIS — Z992 Dependence on renal dialysis: Secondary | ICD-10-CM | POA: Diagnosis not present

## 2021-04-24 DIAGNOSIS — M25652 Stiffness of left hip, not elsewhere classified: Secondary | ICD-10-CM | POA: Diagnosis not present

## 2021-04-24 DIAGNOSIS — E875 Hyperkalemia: Secondary | ICD-10-CM | POA: Diagnosis not present

## 2021-04-24 DIAGNOSIS — M25562 Pain in left knee: Secondary | ICD-10-CM | POA: Diagnosis not present

## 2021-04-24 DIAGNOSIS — M25611 Stiffness of right shoulder, not elsewhere classified: Secondary | ICD-10-CM | POA: Diagnosis not present

## 2021-04-24 DIAGNOSIS — N186 End stage renal disease: Secondary | ICD-10-CM | POA: Diagnosis not present

## 2021-04-24 DIAGNOSIS — R2689 Other abnormalities of gait and mobility: Secondary | ICD-10-CM

## 2021-04-24 DIAGNOSIS — M25561 Pain in right knee: Secondary | ICD-10-CM | POA: Diagnosis not present

## 2021-04-24 DIAGNOSIS — G8929 Other chronic pain: Secondary | ICD-10-CM

## 2021-04-24 DIAGNOSIS — M6281 Muscle weakness (generalized): Secondary | ICD-10-CM | POA: Diagnosis not present

## 2021-04-24 DIAGNOSIS — D631 Anemia in chronic kidney disease: Secondary | ICD-10-CM | POA: Diagnosis not present

## 2021-04-24 DIAGNOSIS — N2581 Secondary hyperparathyroidism of renal origin: Secondary | ICD-10-CM | POA: Diagnosis not present

## 2021-04-24 DIAGNOSIS — Z7682 Awaiting organ transplant status: Secondary | ICD-10-CM | POA: Diagnosis not present

## 2021-04-24 DIAGNOSIS — M25552 Pain in left hip: Secondary | ICD-10-CM | POA: Diagnosis not present

## 2021-04-24 DIAGNOSIS — D689 Coagulation defect, unspecified: Secondary | ICD-10-CM | POA: Diagnosis not present

## 2021-04-24 DIAGNOSIS — D509 Iron deficiency anemia, unspecified: Secondary | ICD-10-CM | POA: Diagnosis not present

## 2021-04-24 NOTE — Therapy (Signed)
OUTPATIENT PHYSICAL THERAPY TREATMENT NOTE   Patient Name: Carl Grigg Sr. MRN: [id number] DOB:05-Nov-1960, 61 y.o., male Today's Date: 04/24/2021  PCP: [name], MD REFERRING PROVIDER: [name], MD Progress Note Reporting Period 03/20/21 to 04/24/21  See note below for Objective Data and Assessment of Progress/Goals.    PT End of Session - 04/24/21 1139     Visit Number 20    Date for PT Re-Evaluation 06/02/21    Authorization Type UHC Medicare    Progress Note Due on Visit 56    PT Start Time 1102    PT Stop Time 1145    PT Time Calculation (min) 43 min    Activity Tolerance Patient tolerated treatment well    Behavior During Therapy WFL for tasks assessed/performed                  Past Medical History:  Diagnosis Date   Acute gastric ulcer with hemorrhage    Acute GI bleeding 07/31/2019   Acute pericarditis    Anemia    hx of   Anxiety    situational    Arthritis    on meds   Borderline diabetes    Cataract    bilateral sx   Chronic headaches    Depression    situational    Diverticulitis    ESRD (end stage renal disease) ([place])     On Renal Transplant List," [facility]; MWF" (10/23/2016)   GERD (gastroesophageal reflux disease)    with certain foods   GI bleed    Hypertension    diet controlled   Parathyroid abnormality (HCC)    ectopic parathyroid gland   Presence of arteriovenous fistula for hemodialysis, primary ([place])    RUE   Refusal of blood product    NO WHOLE BLOOD PROUCTS   Renal cell carcinoma ([place])    s/p hand assisted laparoscopic bilateral nephrectomies 11/29/17, + RCC left   Secondary hyperparathyroidism ([place])    Seizures ([place])    one episode in past, due to" elevated Potassium" 08/02/20- "at least 4 years ago"   Sleep apnea    doesn't use CPAP anymore since weight loss   Stroke [facility])    no residual   Past Surgical History:  Procedure Laterality Date   AV FISTULA PLACEMENT Right    right arm   BIOPSY  08/01/2019    Procedure: BIOPSY;  Surgeon: Juanita Craver, MD;  Location: [facility] ENDOSCOPY;  Service: Endoscopy;;   BIOPSY  11/18/2020   Procedure: BIOPSY;  Surgeon: Daryel November, MD;  Location: [facility] ENDOSCOPY;  Service: Gastroenterology;;   CATARACT EXTRACTION W/ INTRAOCULAR LENS  IMPLANT, BILATERAL     COLON SURGERY     COLONOSCOPY N/A 08/04/2015   Procedure: COLONOSCOPY;  Surgeon: Jerene Bears, MD;  Location: [facility] ENDOSCOPY;  Service: Endoscopy;  Laterality: N/A;   COLONOSCOPY  2017   JMP@ [facility]-good prep-mass -recall 1 yr   COLONOSCOPY WITH PROPOFOL N/A 11/25/2020   Procedure: COLONOSCOPY WITH PROPOFOL;  Surgeon: Sharyn Creamer, MD;  Location: [place];  Service: Gastroenterology;  Laterality: N/A;   ESOPHAGOGASTRODUODENOSCOPY N/A 08/01/2019   Procedure: ESOPHAGOGASTRODUODENOSCOPY (EGD);  Surgeon: Juanita Craver, MD;  Location: [facility] ENDOSCOPY;  Service: Endoscopy;  Laterality: N/A;   ESOPHAGOGASTRODUODENOSCOPY N/A 11/18/2020   Procedure: ESOPHAGOGASTRODUODENOSCOPY (EGD);  Surgeon: Daryel November, MD;  Location: [place];  Service: Gastroenterology;  Laterality: N/A;   ESOPHAGOGASTRODUODENOSCOPY (EGD) WITH PROPOFOL N/A 08/04/2019   Procedure: ESOPHAGOGASTRODUODENOSCOPY (EGD) WITH PROPOFOL;  Surgeon: Yetta Flock, MD;  Location: [facility] ENDOSCOPY;  Service: Gastroenterology;  Laterality: N/A;   graft left arm Left    for dialysis x 2. Removed   HOT HEMOSTASIS  11/18/2020   Procedure: HOT HEMOSTASIS (ARGON PLASMA COAGULATION/BICAP);  Surgeon: Daryel November, MD;  Location: [place];  Service: Gastroenterology;;   INSERTION OF DIALYSIS CATHETER     Rt chest   LAPAROSCOPIC RIGHT COLECTOMY N/A 08/05/2015   Procedure: LAPAROSCOPIC RIGHT COLECTOMY- ASCENDING;  Surgeon: Stark Klein, MD;  Location: [place];  Service: General;  Laterality: N/A;   MASS EXCISION Left 05/28/2019   Procedure: EXCISION SOFT TISSUE MASS LEFT SHOULDER;  Surgeon: Armandina Gemma, MD;  Location: [facility];  Service: General;   Laterality: Left;   NEPHRECTOMY Bilateral    PARATHYROIDECTOMY N/A 06/12/2016   Procedure: TOTAL PARATHYROIDECTOMY WITH AUTOTRANSPLANTATION TO LEFT FOREARM;  Surgeon: Armandina Gemma, MD;  Location: [place];  Service: General;  Laterality: N/A;   PARATHYROIDECTOMY N/A 10/23/2016   Procedure: PARATHYROIDECTOMY;  Surgeon: Armandina Gemma, MD;  Location: [place];  Service: General;  Laterality: N/A;   REVERSE SHOULDER ARTHROPLASTY Right 08/24/2020   Procedure: REVERSE SHOULDER ARTHROPLASTY;  Surgeon: Hiram Gash, MD;  Location: [place];  Service: Orthopedics;  Laterality: Right;   REVISON OF ARTERIOVENOUS FISTULA Right 07/16/2017   Procedure: REVISION OF ARTERIOVENOUS FISTULA  Right ARM;  Surgeon: Waynetta Sandy, MD;  Location: [place];  Service: Vascular;  Laterality: Right;   TOTAL HIP ARTHROPLASTY Left 11/14/2020   Procedure: LEFT TOTAL HIP ARTHROPLASTY ANTERIOR APPROACH;  Surgeon: Leandrew Koyanagi, MD;  Location: [place];  Service: Orthopedics;  Laterality: Left;   UPPER GASTROINTESTINAL ENDOSCOPY  2021   @ [facility]   Patient Active Problem List   Diagnosis Date Noted   Mucosal abnormality of esophagus    Mucosal abnormality of stomach    Mucosal abnormality of duodenum    Upper GI bleed    Left hip pain 11/16/2020   Type 2 diabetes mellitus with other specified complication ([place]) 50/27/7412   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/14/2020   Primary osteoarthritis of left hip 10/07/2020   Status post reverse total arthroplasty of right shoulder 08/24/2020   Diverticulitis 03/08/2020   ESRD on hemodialysis ([place]) 03/08/2020   Polyneuropathy in diseases classified elsewhere ([place]) 03/08/2020   Pure hypercholesterolemia 03/08/2020   Sacral back pain 03/08/2020   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 11/10/2019   Acute gastric ulcer with hemorrhage    Hypotension of hemodialysis 07/31/2019   Diverticulitis large intestine 07/31/2019   Mass of soft tissue of shoulder 05/26/2019   Awaiting organ  transplant status 03/18/2019   Finding of cocaine in blood 02/25/2019   Anaphylactic shock, unspecified, sequela 11/21/2018   Hypercalcemia 10/28/2018   Diarrhea, unspecified 08/08/2018   Hyperkalemia 03/12/2018   Patient is Jehovah's Witness 01/30/2018   Renal cell carcinoma of left kidney ([place]) 12/24/2017   Fluid overload, unspecified 07/06/2017   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   Other specified disorders of kidney and ureter 11/22/2016   Secondary hyperparathyroidism of renal origin ([place]) 10/23/2016   Other headache syndrome 08/21/2016   TIA (transient ischemic attack)    Essential hypertension    Seizures ([place])    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    Lung nodule    Diverticulitis of cecum 07/21/2015   Diverticulitis of large intestine without perforation or abscess without bleeding    Right lower quadrant pain    Obstructive sleep apnea 02/08/2015   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  Diverticulosis 07/13/2014   Lower GI bleed 07/12/2014   Refusal of blood transfusions as patient is Jehovah's Witness    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   HLD (hyperlipidemia)    PRES (posterior reversible encephalopathy syndrome)    History of lacunar cerebrovascular accident (CVA) 05/23/2014   Anemia of renal disease 05/23/2014   GERD (gastroesophageal reflux disease) 12/09/2012   Gastrointestinal bleeding 11/12/2012   Melena 11/12/2012   Other psychoactive substance dependence, uncomplicated ([place]) 54/65/6812   Iron deficiency anemia, unspecified 06/10/2012   Moderate protein-calorie malnutrition ([place]) 07/29/2009   Coagulation defect, unspecified ([place]) 08/29/2006   Type 2 diabetes mellitus with diabetic peripheral angiopathy without gangrene ([place]) 08/29/2006   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 stage renal disease ([place]) 08/02/2006    REFERRING  DIAG: M25.561 (ICD-10-CM) - Pain in right knee  R29.898 (ICD-10-CM) - Other symptoms and signs involving the musculoskeletal system  Onset date: Lt hip replacement 09/2020  THERAPY DIAG:  Muscle weakness (generalized)  Other abnormalities of gait and mobility  Stiffness of right shoulder, not elsewhere classified  Chronic pain of left knee  PERTINENT HISTORY: Lt THA in August 2022, Rt shoulder rotator cuff repair March 2022. Dialysis  PRECAUTIONS: Dialysis 3x/wk  SUBJECTIVE: I'm doing OK.  I'm getting stronger.   PAIN:  Are you having pain? Yes NPRS scale: 5/10 Pain location: Rt shoulder Pain orientation: Right  PAIN TYPE: sharp Pain description: intermittent  Aggravating factors: unknown Relieving factors: unknown  OBJECTIVE: 04/24/21: A/ROM right shoulder measured in standing: flexion 96 degrees, 85 degrees abduction- plateau with this.  Scapular elevation   MMT:  R shoulder strength: flexion 4-/5, IR 4/5, ER 4-/5, Lt knee: 4+/5, hip flexion 4/5 04/03/2021: A/ROM right shoulder measured in standing: flexion 95 degrees, 87 degrees abduction   MMT:  R shoulder strength of 3/5, B hips and knee strength of 4- to 4/5   TODAY'S TREATMENT:  3/13/20223: Nustep L5 x8 min with PT present to discuss status Leg Press 100# 2x10, seat at 9.  RLE then LLE only 60# 2x10 each UBE level 2.0 x3 min each way (fwd/back) with PT monitoring for posture Standing Rows 20# 2x10 Lat pull 40# 2x10 in standing Fwd and Lateral step up on step stool (metal 9" stool) x20 each.  UE support required Sit to/from stand sitting on blue cushion holding 5# kettlebell.  X10 with chest press, x10 with overhead press. Uses 1 hand to push-up Wall push ups 2x10  3/8/20223: Nustep L5 x8 min with PT present to discuss status Leg Press 100# 2x10, seat at 9.  RLE then LLE only 60# 2x10 each UBE level 2.0 x3 min each way (fwd/back) with PT present to discuss status Standing Rows 20# 2x10 Lat pull 40# 2x10 in  standing Fwd and Lateral step up on step stool (metal 9" stool) x10 Bil Sit to/from stand sitting on blue cushion holding 5# kettlebell.  X10 with chest press, x10 with overhead press. Wall push ups 2x10  04/13/2021 Nustep L5 x8 min with PT present to discuss status Leg Press 80# 2x10, seat at 9.  RLE then LLE only 50# 2x10 each Fwd and Lateral step up on step stool (metal 9" stool) 2 x10 Bil Wall wash flexion and scaption x12 each RUE Lat pull 35# 2x10 in standing Standing Rows 20# 2x10 UBE level 1.7 x3 min each way (fwd/back) with PT present to discuss status  PATIENT EDUCATION: Education details: [id number] Person educated: Patient Education method: Explanation, Demonstration, and Handouts Education comprehension: verbalized understanding and returned demonstration   HOME EXERCISE PROGRAM: Access Code: [email or web address] URL: [email or web address] Date: 03/29/2021 Standing Hip Abduction with Counter Support - 1 x daily - 7 x weekly - 2 sets - 10 reps Standing Hip Extension with Counter Support - 1 x daily - 7 x weekly - 2 sets - 10 reps Forward Step Up - 1 x daily - 7 x weekly - 2 sets - 10 reps Standing Row with Anchored Resistance - 1 x daily - 7 x weekly - 2 sets - 10 reps Shoulder Extension with Resistance - 1 x daily - 7 x weekly - 2 sets - 10 reps     PT Short Term Goals - 04/03/21 1147                PT SHORT TERM GOAL #1    Title Pt will be independent with initial HEP.     Status Achieved                     PT Long Term Goals - 04/03/21 1147                PT LONG TERM GOAL #1    Title be independent in advanced HEP     Status On-going          PT LONG TERM GOAL #2    Title improve BLE strength to at least 4+/5 to negotiate steps without increased pain.  Status: 04/24/21- no pain.  Strength remains limited in Lt LE. 4/5 Lt hip flexion    Status On-going          PT LONG TERM GOAL #3    Title Pt to increase R shoulder flexion and abduction to at  least 115 degrees to allow him to reach into overhead cabinets  Status 04/24/21: flexion 95, abduction 85    Status Deferred due to plateau         PT LONG TERM GOAL #4    Title Pt able to negotiate steps with reciprocal pattern with use of unilateral rail to  allow him to do laundry in his home.  Status: 04/24/21-50% of the time now, 9" step in clinic with UE support    Status On-going          PT LONG TERM GOAL #5    Title Increase FOTO to at least 63% to allow pt to perform activities with decreased complication.      Status Achieved                 ASSESSMENT:   CLINICAL IMPRESSION:   Pt continues to progress regarding strength and function.  Pt demonstrates significant limitation in Rt UE A/ROM into flexion and abduction and has reached a plateau with this movement.  Strength and endurance of Rt shoulder musculature have improved.  Pt with improved Lt hip strength and is able to negotiate steps with step-over-step ~50% of the  time.  Pt with improved strength to perform 9" step-ups in the clinic with bil UE support. Pt continues to demonstrate improved overhead reach during overhead press with 5# kettlebell.  Pt continues to require skilled PT to progress towards goal related activities and increased functional ROM and strength for safety and independence at home and in the community.    PERSONAL FACTORS AND COMORBIDITIES:  L THA, R shoulder surgery, ESRD on hemodialysis.  Patient will benefit from skilled therapeutic intervention in order to improve on the following deficits:  Decreased range of motion; Difficulty walking; Increased muscle spasms; Impaired UE functional use; Pain; Impaired flexibility; Decreased strength   PT FREQUENCY: 2x/week   PT DURATION: 8 weeks   Rehab Potential:  Good   Stability/Clinical Decision Making:  Evolving/Moderate complexity   PLANNED INTERVENTIONS: Therapeutic exercises, Therapeutic activity, Neuro Muscular re-education, Balance training,  Gait training, Patient/Family education, Joint mobilization, Stair training, Dry Needling, Cryotherapy, Moist heat, Taping, and Manual therapy   PLAN FOR NEXT SESSION: Continue to address strength, endurance and balance and advance as tolerated.   Sigurd Sos, PT 04/24/21 11:40 AM   [facility] [address] [place], [place] [address] Phone # [phone] Fax [phone]

## 2021-04-26 DIAGNOSIS — D631 Anemia in chronic kidney disease: Secondary | ICD-10-CM | POA: Diagnosis not present

## 2021-04-26 DIAGNOSIS — Z992 Dependence on renal dialysis: Secondary | ICD-10-CM | POA: Diagnosis not present

## 2021-04-26 DIAGNOSIS — N186 End stage renal disease: Secondary | ICD-10-CM | POA: Diagnosis not present

## 2021-04-26 DIAGNOSIS — D509 Iron deficiency anemia, unspecified: Secondary | ICD-10-CM | POA: Diagnosis not present

## 2021-04-26 DIAGNOSIS — Z7682 Awaiting organ transplant status: Secondary | ICD-10-CM | POA: Diagnosis not present

## 2021-04-26 DIAGNOSIS — E875 Hyperkalemia: Secondary | ICD-10-CM | POA: Diagnosis not present

## 2021-04-26 DIAGNOSIS — N2581 Secondary hyperparathyroidism of renal origin: Secondary | ICD-10-CM | POA: Diagnosis not present

## 2021-04-26 DIAGNOSIS — D689 Coagulation defect, unspecified: Secondary | ICD-10-CM | POA: Diagnosis not present

## 2021-04-28 ENCOUNTER — Ambulatory Visit: Payer: Medicare Other | Admitting: Rehabilitative and Restorative Service Providers"

## 2021-04-29 DIAGNOSIS — D631 Anemia in chronic kidney disease: Secondary | ICD-10-CM | POA: Diagnosis not present

## 2021-04-29 DIAGNOSIS — Z992 Dependence on renal dialysis: Secondary | ICD-10-CM | POA: Diagnosis not present

## 2021-04-29 DIAGNOSIS — D689 Coagulation defect, unspecified: Secondary | ICD-10-CM | POA: Diagnosis not present

## 2021-04-29 DIAGNOSIS — N2581 Secondary hyperparathyroidism of renal origin: Secondary | ICD-10-CM | POA: Diagnosis not present

## 2021-04-29 DIAGNOSIS — E875 Hyperkalemia: Secondary | ICD-10-CM | POA: Diagnosis not present

## 2021-04-29 DIAGNOSIS — D509 Iron deficiency anemia, unspecified: Secondary | ICD-10-CM | POA: Diagnosis not present

## 2021-04-29 DIAGNOSIS — Z7682 Awaiting organ transplant status: Secondary | ICD-10-CM | POA: Diagnosis not present

## 2021-04-29 DIAGNOSIS — N186 End stage renal disease: Secondary | ICD-10-CM | POA: Diagnosis not present

## 2021-04-30 DIAGNOSIS — M1612 Unilateral primary osteoarthritis, left hip: Secondary | ICD-10-CM | POA: Diagnosis not present

## 2021-05-01 ENCOUNTER — Other Ambulatory Visit: Payer: Self-pay

## 2021-05-01 ENCOUNTER — Ambulatory Visit: Payer: Medicare Other

## 2021-05-01 DIAGNOSIS — R2689 Other abnormalities of gait and mobility: Secondary | ICD-10-CM | POA: Diagnosis not present

## 2021-05-01 DIAGNOSIS — N186 End stage renal disease: Secondary | ICD-10-CM | POA: Diagnosis not present

## 2021-05-01 DIAGNOSIS — Z7682 Awaiting organ transplant status: Secondary | ICD-10-CM | POA: Diagnosis not present

## 2021-05-01 DIAGNOSIS — M25652 Stiffness of left hip, not elsewhere classified: Secondary | ICD-10-CM | POA: Diagnosis not present

## 2021-05-01 DIAGNOSIS — M25611 Stiffness of right shoulder, not elsewhere classified: Secondary | ICD-10-CM | POA: Diagnosis not present

## 2021-05-01 DIAGNOSIS — D689 Coagulation defect, unspecified: Secondary | ICD-10-CM | POA: Diagnosis not present

## 2021-05-01 DIAGNOSIS — G8929 Other chronic pain: Secondary | ICD-10-CM

## 2021-05-01 DIAGNOSIS — N2581 Secondary hyperparathyroidism of renal origin: Secondary | ICD-10-CM | POA: Diagnosis not present

## 2021-05-01 DIAGNOSIS — M25562 Pain in left knee: Secondary | ICD-10-CM | POA: Diagnosis not present

## 2021-05-01 DIAGNOSIS — M6281 Muscle weakness (generalized): Secondary | ICD-10-CM

## 2021-05-01 DIAGNOSIS — M25552 Pain in left hip: Secondary | ICD-10-CM | POA: Diagnosis not present

## 2021-05-01 DIAGNOSIS — D631 Anemia in chronic kidney disease: Secondary | ICD-10-CM | POA: Diagnosis not present

## 2021-05-01 DIAGNOSIS — E875 Hyperkalemia: Secondary | ICD-10-CM | POA: Diagnosis not present

## 2021-05-01 DIAGNOSIS — M25561 Pain in right knee: Secondary | ICD-10-CM | POA: Diagnosis not present

## 2021-05-01 DIAGNOSIS — D509 Iron deficiency anemia, unspecified: Secondary | ICD-10-CM | POA: Diagnosis not present

## 2021-05-01 DIAGNOSIS — Z992 Dependence on renal dialysis: Secondary | ICD-10-CM | POA: Diagnosis not present

## 2021-05-01 NOTE — Therapy (Signed)
?OUTPATIENT PHYSICAL THERAPY TREATMENT NOTE ? ? ?Patient Name: Carl Husby Sr. ?MRN: [id number] ?DOB:[birth year], 61 y.o., male ?Today's Date: 05/01/2021 ? ?PCP: [name], MD ?REFERRING PROVIDER: [name], MD ?Progress Note ?Reporting Period 03/20/21 to 04/24/21 ? ?See note below for Objective Data and Assessment of Progress/Goals.  ? ? PT End of Session - 05/01/21 1012   ? ? Visit Number 21   ? Date for PT Re-Evaluation 06/02/21   ? Authorization Type UHC Medicare   ? Progress Note Due on Visit 30   ? PT Start Time [phone]   late  ? PT Stop Time 1017   ? PT Time Calculation (min) 30 min   ? Activity Tolerance Patient tolerated treatment well   ? Behavior During Therapy [facility] for tasks assessed/performed   ? ?  ?  ? ?  ? ? ? ?  ? ? ? ?Past Medical History:  ?Diagnosis Date  ? Acute gastric ulcer with hemorrhage   ? Acute GI bleeding 07/31/2019  ? Acute pericarditis   ? Anemia   ? hx of  ? Anxiety   ? situational   ? Arthritis   ? on meds  ? Borderline diabetes   ? Cataract   ? bilateral sx  ? Chronic headaches   ? Depression   ? situational   ? Diverticulitis   ? ESRD (end stage renal disease) ([place])   ?  On Renal Transplant List," [facility]; MWF" (10/23/2016)  ? GERD (gastroesophageal reflux disease)   ? with certain foods  ? GI bleed   ? Hypertension   ? diet controlled  ? Parathyroid abnormality ([place])   ? ectopic parathyroid gland  ? Presence of arteriovenous fistula for hemodialysis, primary ([place])   ? RUE  ? Refusal of blood product   ? NO WHOLE BLOOD PROUCTS  ? Renal cell carcinoma ([place])   ? s/p hand assisted laparoscopic bilateral nephrectomies 11/29/17, + RCC left  ? Secondary hyperparathyroidism ([place])   ? Seizures ([place])   ? one episode in past, due to" elevated Potassium" 08/02/20- "at least 4 years ago"  ? Sleep apnea   ? doesn't use CPAP anymore since weight loss  ? Stroke [facility])   ? no residual  ? ?Past Surgical History:  ?Procedure Laterality Date  ? AV FISTULA PLACEMENT Right   ? right arm  ? BIOPSY   08/01/2019  ? Procedure: BIOPSY;  Surgeon: Juanita Craver, MD;  Location: [name] ENDOSCOPY;  Service: Endoscopy;;  ? BIOPSY  11/18/2020  ? Procedure: BIOPSY;  Surgeon: Daryel November, MD;  Location: [facility] ENDOSCOPY;  Service: Gastroenterology;;  ? CATARACT EXTRACTION W/ INTRAOCULAR LENS  IMPLANT, BILATERAL    ? COLON SURGERY    ? COLONOSCOPY N/A 08/04/2015  ? Procedure: COLONOSCOPY;  Surgeon: Jerene Bears, MD;  Location: [facility] ENDOSCOPY;  Service: Endoscopy;  Laterality: N/A;  ? COLONOSCOPY  2017  ? JMP@ [facility]-good prep-mass -recall 1 yr  ? COLONOSCOPY WITH PROPOFOL N/A 11/25/2020  ? Procedure: COLONOSCOPY WITH PROPOFOL;  Surgeon: Sharyn Creamer, MD;  Location: [place];  Service: Gastroenterology;  Laterality: N/A;  ? ESOPHAGOGASTRODUODENOSCOPY N/A 08/01/2019  ? Procedure: ESOPHAGOGASTRODUODENOSCOPY (EGD);  Surgeon: Juanita Craver, MD;  Location: [facility] ENDOSCOPY;  Service: Endoscopy;  Laterality: N/A;  ? ESOPHAGOGASTRODUODENOSCOPY N/A 11/18/2020  ? Procedure: ESOPHAGOGASTRODUODENOSCOPY (EGD);  Surgeon: Daryel November, MD;  Location: [place];  Service: Gastroenterology;  Laterality: N/A;  ? ESOPHAGOGASTRODUODENOSCOPY (EGD) WITH PROPOFOL N/A 08/04/2019  ? Procedure: ESOPHAGOGASTRODUODENOSCOPY (EGD) WITH PROPOFOL;  Surgeon: Havery Moros,  Carlota Raspberry, MD;  Location: [facility] ENDOSCOPY;  Service: Gastroenterology;  Laterality: N/A;  ? graft left arm Left   ? for dialysis x 2. Removed  ? HOT HEMOSTASIS  11/18/2020  ? Procedure: HOT HEMOSTASIS (ARGON PLASMA COAGULATION/BICAP);  Surgeon: Daryel November, MD;  Location: [place];  Service: Gastroenterology;;  ? INSERTION OF DIALYSIS CATHETER    ? Rt chest  ? LAPAROSCOPIC RIGHT COLECTOMY N/A 08/05/2015  ? Procedure: LAPAROSCOPIC RIGHT COLECTOMY- ASCENDING;  Surgeon: Stark Klein, MD;  Location: [place];  Service: General;  Laterality: N/A;  ? MASS EXCISION Left 05/28/2019  ? Procedure: EXCISION SOFT TISSUE MASS LEFT SHOULDER;  Surgeon: Armandina Gemma, MD;  Location: [facility];  Service:  General;  Laterality: Left;  ? NEPHRECTOMY Bilateral   ? PARATHYROIDECTOMY N/A 06/12/2016  ? Procedure: TOTAL PARATHYROIDECTOMY WITH AUTOTRANSPLANTATION TO LEFT FOREARM;  Surgeon: Armandina Gemma, MD;  Location: [place];  Service: General;  Laterality: N/A;  ? PARATHYROIDECTOMY N/A 10/23/2016  ? Procedure: PARATHYROIDECTOMY;  Surgeon: Armandina Gemma, MD;  Location: [place];  Service: General;  Laterality: N/A;  ? REVERSE SHOULDER ARTHROPLASTY Right 08/24/2020  ? Procedure: REVERSE SHOULDER ARTHROPLASTY;  Surgeon: Hiram Gash, MD;  Location: [place];  Service: Orthopedics;  Laterality: Right;  ? REVISON OF ARTERIOVENOUS FISTULA Right 07/16/2017  ? Procedure: REVISION OF ARTERIOVENOUS FISTULA  Right ARM;  Surgeon: Waynetta Sandy, MD;  Location: [place];  Service: Vascular;  Laterality: Right;  ? TOTAL HIP ARTHROPLASTY Left 11/14/2020  ? Procedure: LEFT TOTAL HIP ARTHROPLASTY ANTERIOR APPROACH;  Surgeon: Leandrew Koyanagi, MD;  Location: [place];  Service: Orthopedics;  Laterality: Left;  ? UPPER GASTROINTESTINAL ENDOSCOPY  2021  ? @ [facility]  ? ?Patient Active Problem List  ? Diagnosis Date Noted  ? Mucosal abnormality of esophagus   ? Mucosal abnormality of stomach   ? Mucosal abnormality of duodenum   ? Upper GI bleed   ? Left hip pain 11/16/2020  ? Type 2 diabetes mellitus with other specified complication ([place]) 97/98/9211  ? 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/14/2020  ? Primary osteoarthritis of left hip 10/07/2020  ? Status post reverse total arthroplasty of right shoulder 08/24/2020  ? Diverticulitis 03/08/2020  ? ESRD on hemodialysis ([place]) 03/08/2020  ? Polyneuropathy in diseases classified elsewhere ([place]) 03/08/2020  ? Pure hypercholesterolemia 03/08/2020  ? Sacral back pain 03/08/2020  ? 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 Acute gastric ulcer with hemorrhage   ? Hypotension of hemodialysis 07/31/2019  ? Diverticulitis large intestine 07/31/2019  ? Mass of soft tissue of shoulder 05/26/2019  ?  Awaiting organ transplant status 03/18/2019  ? Finding of cocaine in blood 02/25/2019  ? Anaphylactic shock, unspecified, sequela 11/21/2018  ? Hypercalcemia 10/28/2018  ? Diarrhea, unspecified 08/08/2018  ? Hyperkalemia 03/12/2018  ? Patient is Jehovah's Witness 01/30/2018  ? Renal cell carcinoma of left kidney ([place]) 12/24/2017  ? Fluid overload, unspecified 07/06/2017  ? 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 Other specified disorders of kidney and ureter 11/22/2016  ? Secondary hyperparathyroidism of renal origin ([place]) 10/23/2016  ? Other headache syndrome 08/21/2016  ? TIA (transient ischemic attack)   ? Essential hypertension   ? Seizures ([place])   ? 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 Lung nodule   ? Diverticulitis of cecum 07/21/2015  ? Diverticulitis of large intestine without perforation or abscess without bleeding   ? Right lower quadrant pain   ? Obstructive sleep apnea 02/08/2015  ?  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 Diverticulosis 07/13/2014  ? Lower GI bleed 07/12/2014  ? Refusal of blood transfusions as patient is Jehovah's Witness   ? Parotid mass   ? 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 HLD (hyperlipidemia)   ? PRES (posterior reversible encephalopathy syndrome)   ? History of lacunar cerebrovascular accident (CVA) 05/23/2014  ? Anemia of renal disease 05/23/2014  ? GERD (gastroesophageal reflux disease) 12/09/2012  ? Gastrointestinal bleeding 11/12/2012  ? Melena 11/12/2012  ? Other psychoactive substance dependence, uncomplicated ([place]) 65/99/3570  ? Iron deficiency anemia, unspecified 06/10/2012  ? Moderate protein-calorie malnutrition ([place]) 07/29/2009  ? Coagulation defect, unspecified ([place]) 08/29/2006  ? Type 2 diabetes mellitus with diabetic peripheral angiopathy without gangrene ([place]) 08/29/2006  ? 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 stage renal disease ([place]) 08/02/2006   ? ? ?REFERRING DIAG: M25.561 (ICD-10-CM) - Pain in right knee  R29.898 (ICD-10-CM) - Other symptoms and signs involving the musculoskeletal system  ?Onset date: Lt hip replacement 09/2020 ? ?THERAPY DIAG:  ?

## 2021-05-03 DIAGNOSIS — E875 Hyperkalemia: Secondary | ICD-10-CM | POA: Diagnosis not present

## 2021-05-03 DIAGNOSIS — D689 Coagulation defect, unspecified: Secondary | ICD-10-CM | POA: Diagnosis not present

## 2021-05-03 DIAGNOSIS — D631 Anemia in chronic kidney disease: Secondary | ICD-10-CM | POA: Diagnosis not present

## 2021-05-03 DIAGNOSIS — N2581 Secondary hyperparathyroidism of renal origin: Secondary | ICD-10-CM | POA: Diagnosis not present

## 2021-05-03 DIAGNOSIS — Z992 Dependence on renal dialysis: Secondary | ICD-10-CM | POA: Diagnosis not present

## 2021-05-03 DIAGNOSIS — N186 End stage renal disease: Secondary | ICD-10-CM | POA: Diagnosis not present

## 2021-05-03 DIAGNOSIS — Z7682 Awaiting organ transplant status: Secondary | ICD-10-CM | POA: Diagnosis not present

## 2021-05-03 DIAGNOSIS — D509 Iron deficiency anemia, unspecified: Secondary | ICD-10-CM | POA: Diagnosis not present

## 2021-05-04 ENCOUNTER — Ambulatory Visit (INDEPENDENT_AMBULATORY_CARE_PROVIDER_SITE_OTHER): Payer: Medicare Other | Admitting: Orthopaedic Surgery

## 2021-05-04 ENCOUNTER — Other Ambulatory Visit: Payer: Self-pay

## 2021-05-04 DIAGNOSIS — Z96642 Presence of left artificial hip joint: Secondary | ICD-10-CM

## 2021-05-04 MED ORDER — AMOXICILLIN 500 MG PO CAPS: ORAL_CAPSULE | ORAL | 3 refills | Status: DC

## 2021-05-04 NOTE — Therapy (Signed)
[facility] [place] ?[place], [place], [address] ?Phone: [phone]   Fax:  [phone] ? ?Patient Details  ?Name: Carl Mceachern Sr. ?MRN: [id number] ?Date of Birth: 10/26/1960 ?Referring Provider:  [name], MD ? ?Encounter Date: 05/05/2021 ? ? ?OUTPATIENT PHYSICAL THERAPY TREATMENT NOTE ? ? ?Patient Name: Carl Kluttz Sr. ?MRN: [id number] ?DOB:1960/10/29, 61 y.o., male ?Today's Date: 05/05/2021 ? ?PCP: [name], MD ?REFERRING PROVIDER: [name], MD ? ? PT End of Session - 05/05/21 1020   ? ? Visit Number 22   ? Date for PT Re-Evaluation 06/02/21   ? Authorization Type [employer] Medicare   ? Progress Note Due on Visit 30   ? PT Start Time 1017   ? PT Stop Time 1058   ? PT Time Calculation (min) 41 min   ? Activity Tolerance Patient tolerated treatment well   ? Behavior During Therapy [facility] for tasks assessed/performed   ? ?  ?  ? ?  ? ? ?Past Medical History:  ?Diagnosis Date  ? Acute gastric ulcer with hemorrhage   ? Acute GI bleeding 07/31/2019  ? Acute pericarditis   ? Anemia   ? hx of  ? Anxiety   ? situational   ? Arthritis   ? on meds  ? Borderline diabetes   ? Cataract   ? bilateral sx  ? Chronic headaches   ? Depression   ? situational   ? Diverticulitis   ? ESRD (end stage renal disease) ([place])   ?  On Renal Transplant List," [facility]; MWF" (10/23/2016)  ? GERD (gastroesophageal reflux disease)   ? with certain foods  ? GI bleed   ? Hypertension   ? diet controlled  ? Parathyroid abnormality ([place])   ? ectopic parathyroid gland  ? Presence of arteriovenous fistula for hemodialysis, primary ([place])   ? RUE  ? Refusal of blood product   ? NO WHOLE BLOOD PROUCTS  ? Renal cell carcinoma ([place])   ? s/p hand assisted laparoscopic bilateral nephrectomies 11/29/17, + RCC left  ? Secondary hyperparathyroidism ([place])   ? Seizures ([place])   ? one episode in past, due to" elevated Potassium" 08/02/20- "at least 4 years ago"  ? Sleep apnea   ? doesn't use  CPAP anymore since weight loss  ? Stroke [facility])   ? no residual  ? ?Past Surgical History:  ?Procedure Laterality Date  ? AV FISTULA PLACEMENT Right   ? right arm  ? BIOPSY  08/01/2019  ? Procedure: BIOPSY;  Surgeon: Juanita Craver, MD;  Location: [facility] ENDOSCOPY;  Service: Endoscopy;;  ? BIOPSY  11/18/2020  ? Procedure: BIOPSY;  Surgeon: Daryel November, MD;  Location: [facility] ENDOSCOPY;  Service: Gastroenterology;;  ? CATARACT EXTRACTION W/ INTRAOCULAR LENS  IMPLANT, BILATERAL    ? COLON SURGERY    ? COLONOSCOPY N/A 08/04/2015  ? Procedure: COLONOSCOPY;  Surgeon: Jerene Bears, MD;  Location: [facility] ENDOSCOPY;  Service: Endoscopy;  Laterality: N/A;  ? COLONOSCOPY  2017  ? JMP@ [facility] prep-mass -recall 1 yr  ? COLONOSCOPY WITH PROPOFOL N/A 11/25/2020  ? Procedure: COLONOSCOPY WITH PROPOFOL;  Surgeon: Sharyn Creamer, MD;  Location: [place];  Service: Gastroenterology;  Laterality: N/A;  ? ESOPHAGOGASTRODUODENOSCOPY N/A 08/01/2019  ? Procedure: ESOPHAGOGASTRODUODENOSCOPY (EGD);  Surgeon: Juanita Craver, MD;  Location: [facility] ENDOSCOPY;  Service: Endoscopy;  Laterality: N/A;  ? ESOPHAGOGASTRODUODENOSCOPY N/A 11/18/2020  ? Procedure: ESOPHAGOGASTRODUODENOSCOPY (EGD);  Surgeon: Daryel November, MD;  Location: [place];  Service:  Gastroenterology;  Laterality: N/A;  ? ESOPHAGOGASTRODUODENOSCOPY (EGD) WITH PROPOFOL N/A 08/04/2019  ? Procedure: ESOPHAGOGASTRODUODENOSCOPY (EGD) WITH PROPOFOL;  Surgeon: Yetta Flock, MD;  Location: [place];  Service: Gastroenterology;  Laterality: N/A;  ? graft left arm Left   ? for dialysis x 2. Removed  ? HOT HEMOSTASIS  11/18/2020  ? Procedure: HOT HEMOSTASIS (ARGON PLASMA COAGULATION/BICAP);  Surgeon: Daryel November, MD;  Location: [place];  Service: Gastroenterology;;  ? INSERTION OF DIALYSIS CATHETER    ? Rt chest  ? LAPAROSCOPIC RIGHT COLECTOMY N/A 08/05/2015  ? Procedure: LAPAROSCOPIC RIGHT COLECTOMY- ASCENDING;  Surgeon: Stark Klein, MD;  Location: [place];   Service: General;  Laterality: N/A;  ? MASS EXCISION Left 05/28/2019  ? Procedure: EXCISION SOFT TISSUE MASS LEFT SHOULDER;  Surgeon: Armandina Gemma, MD;  Location: [facility];  Service: General;  Laterality: Left;  ? NEPHRECTOMY Bilateral   ? PARATHYROIDECTOMY N/A 06/12/2016  ? Procedure: TOTAL PARATHYROIDECTOMY WITH AUTOTRANSPLANTATION TO LEFT FOREARM;  Surgeon: Armandina Gemma, MD;  Location: [place];  Service: General;  Laterality: N/A;  ? PARATHYROIDECTOMY N/A 10/23/2016  ? Procedure: PARATHYROIDECTOMY;  Surgeon: Armandina Gemma, MD;  Location: [place];  Service: General;  Laterality: N/A;  ? REVERSE SHOULDER ARTHROPLASTY Right 08/24/2020  ? Procedure: REVERSE SHOULDER ARTHROPLASTY;  Surgeon: Hiram Gash, MD;  Location: [place];  Service: Orthopedics;  Laterality: Right;  ? REVISON OF ARTERIOVENOUS FISTULA Right 07/16/2017  ? Procedure: REVISION OF ARTERIOVENOUS FISTULA  Right ARM;  Surgeon: Waynetta Sandy, MD;  Location: [place];  Service: Vascular;  Laterality: Right;  ? TOTAL HIP ARTHROPLASTY Left 11/14/2020  ? Procedure: LEFT TOTAL HIP ARTHROPLASTY ANTERIOR APPROACH;  Surgeon: Leandrew Koyanagi, MD;  Location: [place];  Service: Orthopedics;  Laterality: Left;  ? UPPER GASTROINTESTINAL ENDOSCOPY  2021  ? @ [facility]  ? ?Patient Active Problem List  ? Diagnosis Date Noted  ? Mucosal abnormality of esophagus   ? Mucosal abnormality of stomach   ? Mucosal abnormality of duodenum   ? Upper GI bleed   ? Left hip pain 11/16/2020  ? Type 2 diabetes mellitus with other specified complication ([place]) 16/57/9038  ? 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/14/2020  ? Primary osteoarthritis of left hip 10/07/2020  ? Status post reverse total arthroplasty of right shoulder 08/24/2020  ? Diverticulitis 03/08/2020  ? ESRD on hemodialysis ([place]) 03/08/2020  ? Polyneuropathy in diseases classified elsewhere ([place]) 03/08/2020  ? Pure hypercholesterolemia 03/08/2020  ? Sacral back pain 03/08/2020  ? 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 Acute gastric ulcer with hemorrhage   ? Hypotension of hemodialysis 07/31/2019  ? Diverticulitis large intestine 07/31/2019  ? Mass of soft tissue of shoulder 05/26/2019  ? Awaiting organ transplant status 03/18/2019  ? Finding of cocaine in blood 02/25/2019  ? Anaphylactic shock, unspecified, sequela 11/21/2018  ? Hypercalcemia 10/28/2018  ? Diarrhea, unspecified 08/08/2018  ? Hyperkalemia 03/12/2018  ? Patient is Jehovah's Witness 01/30/2018  ? Renal cell carcinoma of left kidney ([place]) 12/24/2017  ? Fluid overload, unspecified 07/06/2017  ? 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 Other specified disorders of kidney and ureter 11/22/2016  ? Secondary hyperparathyroidism of renal origin ([place]) 10/23/2016  ? Other headache syndrome 08/21/2016  ? TIA (transient ischemic attack)   ? Essential hypertension   ? Seizures ([place])   ? 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 Lung nodule   ? Diverticulitis of cecum 07/21/2015  ? Diverticulitis of large intestine  without perforation or abscess without bleeding   ? Right lower quadrant pain   ? Obstructive sleep apnea 02/08/2015  ? 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 Diverticulosis 07/13/2014  ? Lower GI bleed 07/12/2014  ? Refusal of blood transfusions as patient is Jehovah's Witness   ? Parotid mass   ? 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 HLD (hyperlipidemia)   ? PRES (posterior reversible encephalopathy syndrome)   ? History of lacunar cerebrovascular accident (CVA) 05/23/2014  ? Anemia of renal disease 05/23/2014  ? GERD (gastroesophageal reflux disease) 12/09/2012  ? Gastrointestinal bleeding 11/12/2012  ? Melena 11/12/2012  ? Other psychoactive substance dependence, uncomplicated ([place]) 38/11/1749  ? Iron deficiency anemia, unspecified 06/10/2012  ? Moderate protein-calorie malnutrition ([place]) 07/29/2009  ? Coagulation defect, unspecified ([place]) 08/29/2006  ? Type 2 diabetes mellitus with diabetic  peripheral angiopathy without gangrene ([place]) 08/29/2006  ? 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 stage renal disease ([place]) 08/02/2006  ? ? ?REFERRING DIAG: M25.561 (ICD

## 2021-05-04 NOTE — Progress Notes (Signed)
? ?Post-Op Visit Note ?  ?Patient: Waylan Busta Sr.           ?Date of Birth: Aug 07, 1960           ?MRN: [id number] ?Visit Date: 05/04/2021 ?PCP: [name], MD ? ? ?Assessment & Plan: ? ?Chief Complaint:  ?Chief Complaint  ?Patient presents with  ? Left Hip - Pain  ? ?Visit Diagnoses:  ?1. Status post total hip replacement, left   ? ? ?Plan: Patient is a pleasant 61 year old gentleman who comes in today approximately 6 months status post left total hip replacement 11/14/2020.  He has been doing well.  He is ambulating without assistance.  Examination of his left hip reveals painless hip flexion and logroll.  He is neurovascular intact distally.  At this point, he will continue to advance activity as tolerated.  Dental prophylaxis reinforced.  Follow-up in 6 months when he is year out from surgery for repeat evaluation and AP pelvis x-rays.  Call with concerns or questions. ? ?Follow-Up Instructions: Return in about 6 months (around 11/04/2021).  ? ?Orders:  ?No orders of the defined types were placed in this encounter. ? ?No orders of the defined types were placed in this encounter. ? ? ?Imaging: ?No new imaging ? ?PMFS History: ?Patient Active Problem List  ? Diagnosis Date Noted  ? Mucosal abnormality of esophagus   ? Mucosal abnormality of stomach   ? Mucosal abnormality of duodenum   ? Upper GI bleed   ? Left hip pain 11/16/2020  ? Type 2 diabetes mellitus with other specified complication ([place]) 41/63/8453  ? 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/14/2020  ? Primary osteoarthritis of left hip 10/07/2020  ? Status post reverse total arthroplasty of right shoulder 08/24/2020  ? Diverticulitis 03/08/2020  ? ESRD on hemodialysis ([place]) 03/08/2020  ? Polyneuropathy in diseases classified elsewhere ([place]) 03/08/2020  ? Pure hypercholesterolemia 03/08/2020  ? Sacral back pain 03/08/2020  ? 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 Acute gastric ulcer with hemorrhage   ? Hypotension of hemodialysis  07/31/2019  ? Diverticulitis large intestine 07/31/2019  ? Mass of soft tissue of shoulder 05/26/2019  ? Awaiting organ transplant status 03/18/2019  ? Finding of cocaine in blood 02/25/2019  ? Anaphylactic shock, unspecified, sequela 11/21/2018  ? Hypercalcemia 10/28/2018  ? Diarrhea, unspecified 08/08/2018  ? Hyperkalemia 03/12/2018  ? Patient is Jehovah's Witness 01/30/2018  ? Renal cell carcinoma of left kidney ([place]) 12/24/2017  ? Fluid overload, unspecified 07/06/2017  ? 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 Other specified disorders of kidney and ureter 11/22/2016  ? Secondary hyperparathyroidism of renal origin ([place]) 10/23/2016  ? Other headache syndrome 08/21/2016  ? TIA (transient ischemic attack)   ? Essential hypertension   ? Seizures ([place])   ? 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 Lung nodule   ? Diverticulitis of cecum 07/21/2015  ? Diverticulitis of large intestine without perforation or abscess without bleeding   ? Right lower quadrant pain   ? Obstructive sleep apnea 02/08/2015  ? 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 Diverticulosis 07/13/2014  ? Lower GI bleed 07/12/2014  ? Refusal of blood transfusions as patient is Jehovah's Witness   ? Parotid mass   ? 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 HLD (hyperlipidemia)   ? PRES (posterior reversible encephalopathy syndrome)   ? History of lacunar cerebrovascular accident (CVA) 05/23/2014  ? Anemia of renal disease 05/23/2014  ? GERD (  gastroesophageal reflux disease) 12/09/2012  ? Gastrointestinal bleeding 11/12/2012  ? Melena 11/12/2012  ? Other psychoactive substance dependence, uncomplicated ([place]) 41/93/7902  ? Iron deficiency anemia, unspecified 06/10/2012  ? Moderate protein-calorie malnutrition ([place]) 07/29/2009  ? Coagulation defect, unspecified ([place]) 08/29/2006  ? Type 2 diabetes mellitus with diabetic peripheral angiopathy without gangrene ([place]) 08/29/2006  ?  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 stage renal disease ([place]) 08/02/2006  ? ?Past Medical History:  ?Diagnosis Date  ? Acute gastric ulcer with hemorrhage   ? Acute GI bleeding 07/31/2019  ? Acute pericarditis   ? Anemia   ? hx of  ? Anxiety   ? situational   ? Arthritis   ? on meds  ? Borderline diabetes   ? Cataract   ? bilateral sx  ? Chronic headaches   ? Depression   ? situational   ? Diverticulitis   ? ESRD (end stage renal disease) ([place])   ?  On Renal Transplant List," [facility]; MWF" (10/23/2016)  ? GERD (gastroesophageal reflux disease)   ? with certain foods  ? GI bleed   ? Hypertension   ? diet controlled  ? Parathyroid abnormality ([place])   ? ectopic parathyroid gland  ? Presence of arteriovenous fistula for hemodialysis, primary ([place])   ? RUE  ? Refusal of blood product   ? NO WHOLE BLOOD PROUCTS  ? Renal cell carcinoma ([place])   ? s/p hand assisted laparoscopic bilateral nephrectomies 11/29/17, + RCC left  ? Secondary hyperparathyroidism ([place])   ? Seizures ([place])   ? one episode in past, due to" elevated Potassium" 08/02/20- "at least 4 years ago"  ? Sleep apnea   ? doesn't use CPAP anymore since weight loss  ? Stroke [facility])   ? no residual  ?  ?Family History  ?Problem Relation Age of Onset  ? Diabetes Father   ? Stroke Father   ? Hypertension Father   ? Uterine cancer Mother   ? Lupus Sister   ? Stroke Sister   ? Hypertension Sister   ? Anuerysm Brother   ?     brain  ? Colon cancer Neg Hx   ? Esophageal cancer Neg Hx   ? Stomach cancer Neg Hx   ? Pancreatic cancer Neg Hx   ? Liver disease Neg Hx   ? Colon polyps Neg Hx   ? Rectal cancer Neg Hx   ?  ?Past Surgical History:  ?Procedure Laterality Date  ? AV FISTULA PLACEMENT Right   ? right arm  ? BIOPSY  08/01/2019  ? Procedure: BIOPSY;  Surgeon: Juanita Craver, MD;  Location[facility] ENDOSCOPY;  Service: Endoscopy;;  ? BIOPSY  11/18/2020  ? Procedure: BIOPSY;  Surgeon: Daryel November, MD;  Location: [facility] ENDOSCOPY;   Service: Gastroenterology;;  ? CATARACT EXTRACTION W/ INTRAOCULAR LENS  IMPLANT, BILATERAL    ? COLON SURGERY    ? COLONOSCOPY N/A 08/04/2015  ? Procedure: COLONOSCOPY;  Surgeon: Jerene Bears, MD;  Location[facility] ENDOSCOPY;  Service: Endoscopy;  Laterality: N/A;  ? COLONOSCOPY  2017  ? JMP@ [facility] prep-mass -recall 1 yr  ? COLONOSCOPY WITH PROPOFOL N/A 11/25/2020  ? Procedure: COLONOSCOPY WITH PROPOFOL;  Surgeon: Sharyn Creamer, MD;  Location: [place];  Service: Gastroenterology;  Laterality: N/A;  ? ESOPHAGOGASTRODUODENOSCOPY N/A 08/01/2019  ? Procedure: ESOPHAGOGASTRODUODENOSCOPY (EGD);  Surgeon: Juanita Craver, MD;  Location: [facility] ENDOSCOPY;  Service: Endoscopy;  Laterality: N/A;  ? ESOPHAGOGASTRODUODENOSCOPY N/A 11/18/2020  ? Procedure:  ESOPHAGOGASTRODUODENOSCOPY (EGD);  Surgeon: Daryel November, MD;  Location: [place];  Service: Gastroenterology;  Laterality: N/A;  ? ESOPHAGOGASTRODUODENOSCOPY (EGD) WITH PROPOFOL N/A 08/04/2019  ? Procedure: ESOPHAGOGASTRODUODENOSCOPY (EGD) WITH PROPOFOL;  Surgeon: Yetta Flock, MD;  Location: [place];  Service: Gastroenterology;  Laterality: N/A;  ? graft left arm Left   ? for dialysis x 2. Removed  ? HOT HEMOSTASIS  11/18/2020  ? Procedure: HOT HEMOSTASIS (ARGON PLASMA COAGULATION/BICAP);  Surgeon: Daryel November, MD;  Location: [place];  Service: Gastroenterology;;  ? INSERTION OF DIALYSIS CATHETER    ? Rt chest  ? LAPAROSCOPIC RIGHT COLECTOMY N/A 08/05/2015  ? Procedure: LAPAROSCOPIC RIGHT COLECTOMY- ASCENDING;  Surgeon: Stark Klein, MD;  Location: [place];  Service: General;  Laterality: N/A;  ? MASS EXCISION Left 05/28/2019  ? Procedure: EXCISION SOFT TISSUE MASS LEFT SHOULDER;  Surgeon: Armandina Gemma, MD;  Location[facility];  Service: General;  Laterality: Left;  ? NEPHRECTOMY Bilateral   ? PARATHYROIDECTOMY N/A 06/12/2016  ? Procedure: TOTAL PARATHYROIDECTOMY WITH AUTOTRANSPLANTATION TO LEFT FOREARM;  Surgeon: Armandina Gemma, MD;  Location: [place];  Service: General;  Laterality: N/A;  ? PARATHYROIDECTOMY N/A 10/23/2016  ? Procedure: PARATHYROIDECTOMY;  Surgeon: Armandina Gemma, MD;  Location: [place];  Service: General;  Laterality: N/A;  ? REVERSE SHOULDER ARTHROP

## 2021-05-05 ENCOUNTER — Ambulatory Visit: Payer: Medicare Other | Admitting: Physical Therapy

## 2021-05-05 ENCOUNTER — Encounter: Payer: Self-pay | Admitting: Physical Therapy

## 2021-05-05 DIAGNOSIS — Z992 Dependence on renal dialysis: Secondary | ICD-10-CM | POA: Diagnosis not present

## 2021-05-05 DIAGNOSIS — M25562 Pain in left knee: Secondary | ICD-10-CM | POA: Diagnosis not present

## 2021-05-05 DIAGNOSIS — M25561 Pain in right knee: Secondary | ICD-10-CM | POA: Diagnosis not present

## 2021-05-05 DIAGNOSIS — D509 Iron deficiency anemia, unspecified: Secondary | ICD-10-CM | POA: Diagnosis not present

## 2021-05-05 DIAGNOSIS — N2581 Secondary hyperparathyroidism of renal origin: Secondary | ICD-10-CM | POA: Diagnosis not present

## 2021-05-05 DIAGNOSIS — E875 Hyperkalemia: Secondary | ICD-10-CM | POA: Diagnosis not present

## 2021-05-05 DIAGNOSIS — M6281 Muscle weakness (generalized): Secondary | ICD-10-CM | POA: Diagnosis not present

## 2021-05-05 DIAGNOSIS — M25611 Stiffness of right shoulder, not elsewhere classified: Secondary | ICD-10-CM

## 2021-05-05 DIAGNOSIS — M25552 Pain in left hip: Secondary | ICD-10-CM | POA: Diagnosis not present

## 2021-05-05 DIAGNOSIS — R2689 Other abnormalities of gait and mobility: Secondary | ICD-10-CM

## 2021-05-05 DIAGNOSIS — M25652 Stiffness of left hip, not elsewhere classified: Secondary | ICD-10-CM | POA: Diagnosis not present

## 2021-05-05 DIAGNOSIS — D631 Anemia in chronic kidney disease: Secondary | ICD-10-CM | POA: Diagnosis not present

## 2021-05-05 DIAGNOSIS — G8929 Other chronic pain: Secondary | ICD-10-CM | POA: Diagnosis not present

## 2021-05-05 DIAGNOSIS — D689 Coagulation defect, unspecified: Secondary | ICD-10-CM | POA: Diagnosis not present

## 2021-05-05 DIAGNOSIS — Z7682 Awaiting organ transplant status: Secondary | ICD-10-CM | POA: Diagnosis not present

## 2021-05-05 DIAGNOSIS — N186 End stage renal disease: Secondary | ICD-10-CM | POA: Diagnosis not present

## 2021-05-08 ENCOUNTER — Other Ambulatory Visit: Payer: Self-pay

## 2021-05-08 ENCOUNTER — Ambulatory Visit: Payer: Medicare Other

## 2021-05-08 DIAGNOSIS — M25562 Pain in left knee: Secondary | ICD-10-CM | POA: Diagnosis not present

## 2021-05-08 DIAGNOSIS — R2689 Other abnormalities of gait and mobility: Secondary | ICD-10-CM

## 2021-05-08 DIAGNOSIS — M6281 Muscle weakness (generalized): Secondary | ICD-10-CM | POA: Diagnosis not present

## 2021-05-08 DIAGNOSIS — G8929 Other chronic pain: Secondary | ICD-10-CM | POA: Diagnosis not present

## 2021-05-08 DIAGNOSIS — Z992 Dependence on renal dialysis: Secondary | ICD-10-CM | POA: Diagnosis not present

## 2021-05-08 DIAGNOSIS — E875 Hyperkalemia: Secondary | ICD-10-CM | POA: Diagnosis not present

## 2021-05-08 DIAGNOSIS — M25552 Pain in left hip: Secondary | ICD-10-CM | POA: Diagnosis not present

## 2021-05-08 DIAGNOSIS — Z7682 Awaiting organ transplant status: Secondary | ICD-10-CM | POA: Diagnosis not present

## 2021-05-08 DIAGNOSIS — D689 Coagulation defect, unspecified: Secondary | ICD-10-CM | POA: Diagnosis not present

## 2021-05-08 DIAGNOSIS — D509 Iron deficiency anemia, unspecified: Secondary | ICD-10-CM | POA: Diagnosis not present

## 2021-05-08 DIAGNOSIS — M25611 Stiffness of right shoulder, not elsewhere classified: Secondary | ICD-10-CM | POA: Diagnosis not present

## 2021-05-08 DIAGNOSIS — N2581 Secondary hyperparathyroidism of renal origin: Secondary | ICD-10-CM | POA: Diagnosis not present

## 2021-05-08 DIAGNOSIS — M25652 Stiffness of left hip, not elsewhere classified: Secondary | ICD-10-CM | POA: Diagnosis not present

## 2021-05-08 DIAGNOSIS — N186 End stage renal disease: Secondary | ICD-10-CM | POA: Diagnosis not present

## 2021-05-08 DIAGNOSIS — D631 Anemia in chronic kidney disease: Secondary | ICD-10-CM | POA: Diagnosis not present

## 2021-05-08 DIAGNOSIS — M25561 Pain in right knee: Secondary | ICD-10-CM | POA: Diagnosis not present

## 2021-05-08 NOTE — Therapy (Signed)
[place] ?[facility] ?[place], [place], [address] ?Phone: [phone]   Fax:  [phone] ? ?Patient Details  ?Name: Carl Pavich Sr. ?MRN: [id number] ?Date of Birth: [birth year] ?Referring Provider:  [name], MD ? ?Encounter Date: 05/08/2021 ? ? ?OUTPATIENT PHYSICAL THERAPY TREATMENT NOTE ? ? ?Patient Name: Carl Wingerter Sr. ?MRN: [id number] ?DOB:[birth year], 61 y.o., male ?Today's Date: 05/08/2021 ? ?PCP: [name], MD ?REFERRING PROVIDER: [name], MD ? ? PT End of Session - 05/08/21 1052   ? ? Visit Number 23   ? Date for PT Re-Evaluation 06/02/21   ? Authorization Type UHC Medicare   ? Progress Note Due on Visit 30   ? PT Start Time 1016   ? PT Stop Time 1059   ? PT Time Calculation (min) 43 min   ? Activity Tolerance Patient tolerated treatment well   ? Behavior During Therapy [facility] for tasks assessed/performed   ? ?  ?  ? ?  ? ? ? ?Past Medical History:  ?Diagnosis Date  ? Acute gastric ulcer with hemorrhage   ? Acute GI bleeding 07/31/2019  ? Acute pericarditis   ? Anemia   ? hx of  ? Anxiety   ? situational   ? Arthritis   ? on meds  ? Borderline diabetes   ? Cataract   ? bilateral sx  ? Chronic headaches   ? Depression   ? situational   ? Diverticulitis   ? ESRD (end stage renal disease) ([place])   ?  On Renal Transplant List," [facility]; MWF" (10/23/2016)  ? GERD (gastroesophageal reflux disease)   ? with certain foods  ? GI bleed   ? Hypertension   ? diet controlled  ? Parathyroid abnormality ([place])   ? ectopic parathyroid gland  ? Presence of arteriovenous fistula for hemodialysis, primary ([place])   ? RUE  ? Refusal of blood product   ? NO WHOLE BLOOD PROUCTS  ? Renal cell carcinoma ([place])   ? s/p hand assisted laparoscopic bilateral nephrectomies 11/29/17, + RCC left  ? Secondary hyperparathyroidism ([place])   ? Seizures ([place])   ? one episode in past, due to" elevated Potassium" 08/02/20- "at least 4 years ago"  ? Sleep apnea   ? doesn't use  CPAP anymore since weight loss  ? Stroke [facility])   ? no residual  ? ?Past Surgical History:  ?Procedure Laterality Date  ? AV FISTULA PLACEMENT Right   ? right arm  ? BIOPSY  08/01/2019  ? Procedure: BIOPSY;  Surgeon: Juanita Craver, MD;  Location: [facility] ENDOSCOPY;  Service: Endoscopy;;  ? BIOPSY  11/18/2020  ? Procedure: BIOPSY;  Surgeon: Daryel November, MD;  Location: [facility] ENDOSCOPY;  Service: Gastroenterology;;  ? CATARACT EXTRACTION W/ INTRAOCULAR LENS  IMPLANT, BILATERAL    ? COLON SURGERY    ? COLONOSCOPY N/A 08/04/2015  ? Procedure: COLONOSCOPY;  Surgeon: Jerene Bears, MD;  Location: [facility] ENDOSCOPY;  Service: Endoscopy;  Laterality: N/A;  ? COLONOSCOPY  2017  ? JMP@ [facility]-good prep-mass -recall 1 yr  ? COLONOSCOPY WITH PROPOFOL N/A 11/25/2020  ? Procedure: COLONOSCOPY WITH PROPOFOL;  Surgeon: Sharyn Creamer, MD;  Location: [place];  Service: Gastroenterology;  Laterality: N/A;  ? ESOPHAGOGASTRODUODENOSCOPY N/A 08/01/2019  ? Procedure: ESOPHAGOGASTRODUODENOSCOPY (EGD);  Surgeon: Juanita Craver, MD;  Location: [facility] ENDOSCOPY;  Service: Endoscopy;  Laterality: N/A;  ? ESOPHAGOGASTRODUODENOSCOPY N/A 11/18/2020  ? Procedure: ESOPHAGOGASTRODUODENOSCOPY (EGD);  Surgeon: Daryel November, MD;  Location: [facility] ENDOSCOPY;  Service: Gastroenterology;  Laterality: N/A;  ? ESOPHAGOGASTRODUODENOSCOPY (EGD) WITH PROPOFOL N/A 08/04/2019  ? Procedure: ESOPHAGOGASTRODUODENOSCOPY (EGD) WITH PROPOFOL;  Surgeon: Yetta Flock, MD;  Location: [place];  Service: Gastroenterology;  Laterality: N/A;  ? graft left arm Left   ? for dialysis x 2. Removed  ? HOT HEMOSTASIS  11/18/2020  ? Procedure: HOT HEMOSTASIS (ARGON PLASMA COAGULATION/BICAP);  Surgeon: Daryel November, MD;  Location: [place];  Service: Gastroenterology;;  ? INSERTION OF DIALYSIS CATHETER    ? Rt chest  ? LAPAROSCOPIC RIGHT COLECTOMY N/A 08/05/2015  ? Procedure: LAPAROSCOPIC RIGHT COLECTOMY- ASCENDING;  Surgeon: Stark Klein, MD;  Location: [place];   Service: General;  Laterality: N/A;  ? MASS EXCISION Left 05/28/2019  ? Procedure: EXCISION SOFT TISSUE MASS LEFT SHOULDER;  Surgeon: Armandina Gemma, MD;  Location: [facility];  Service: General;  Laterality: Left;  ? NEPHRECTOMY Bilateral   ? PARATHYROIDECTOMY N/A 06/12/2016  ? Procedure: TOTAL PARATHYROIDECTOMY WITH AUTOTRANSPLANTATION TO LEFT FOREARM;  Surgeon: Armandina Gemma, MD;  Location: [place];  Service: General;  Laterality: N/A;  ? PARATHYROIDECTOMY N/A 10/23/2016  ? Procedure: PARATHYROIDECTOMY;  Surgeon: Armandina Gemma, MD;  Location: [place];  Service: General;  Laterality: N/A;  ? REVERSE SHOULDER ARTHROPLASTY Right 08/24/2020  ? Procedure: REVERSE SHOULDER ARTHROPLASTY;  Surgeon: Hiram Gash, MD;  Location: [place];  Service: Orthopedics;  Laterality: Right;  ? REVISON OF ARTERIOVENOUS FISTULA Right 07/16/2017  ? Procedure: REVISION OF ARTERIOVENOUS FISTULA  Right ARM;  Surgeon: Waynetta [place], MD;  Location: [place];  Service: Vascular;  Laterality: Right;  ? TOTAL HIP ARTHROPLASTY Left 11/14/2020  ? Procedure: LEFT TOTAL HIP ARTHROPLASTY ANTERIOR APPROACH;  Surgeon: Leandrew Koyanagi, MD;  Location: [place];  Service: Orthopedics;  Laterality: Left;  ? UPPER GASTROINTESTINAL ENDOSCOPY  2021  ? @ [facility]  ? ?Patient Active Problem List  ? Diagnosis Date Noted  ? Mucosal abnormality of esophagus   ? Mucosal abnormality of stomach   ? Mucosal abnormality of duodenum   ? Upper GI bleed   ? Left hip pain 11/16/2020  ? Type 2 diabetes mellitus with other specified complication ([place]) 38/93/7342  ? 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/14/2020  ? Primary osteoarthritis of left hip 10/07/2020  ? Status post reverse total arthroplasty of right shoulder 08/24/2020  ? Diverticulitis 03/08/2020  ? ESRD on hemodialysis ([place]) 03/08/2020  ? Polyneuropathy in diseases classified elsewhere ([place]) 03/08/2020  ? Pure hypercholesterolemia 03/08/2020  ? Sacral back pain 03/08/2020  ? 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 Acute gastric ulcer with hemorrhage   ? Hypotension of hemodialysis 07/31/2019  ? Diverticulitis large intestine 07/31/2019  ? Mass of soft tissue of shoulder 05/26/2019  ? Awaiting organ transplant status 03/18/2019  ? Finding of cocaine in blood 02/25/2019  ? Anaphylactic shock, unspecified, sequela 11/21/2018  ? Hypercalcemia 10/28/2018  ? Diarrhea, unspecified 08/08/2018  ? Hyperkalemia 03/12/2018  ? Patient is Jehovah's Witness 01/30/2018  ? Renal cell carcinoma of left kidney ([place]) 12/24/2017  ? Fluid overload, unspecified 07/06/2017  ? 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 Other specified disorders of kidney and ureter 11/22/2016  ? Secondary hyperparathyroidism of renal origin ([place]) 10/23/2016  ? Other headache syndrome 08/21/2016  ? TIA (transient ischemic attack)   ? Essential hypertension   ? Seizures ([place])   ? 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 Lung nodule   ? Diverticulitis of cecum 07/21/2015  ? Diverticulitis of large  intestine without perforation or abscess without bleeding   ? Right lower quadrant pain   ? Obstructive sleep apnea 02/08/2015  ? 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 Diverticulosis 07/13/2014  ? Lower GI bleed 07/12/2014  ? Refusal of blood transfusions as patient is Jehovah's Witness   ? Parotid mass   ? 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 HLD (hyperlipidemia)   ? PRES (posterior reversible encephalopathy syndrome)   ? History of lacunar cerebrovascular accident (CVA) 05/23/2014  ? Anemia of renal disease 05/23/2014  ? GERD (gastroesophageal reflux disease) 12/09/2012  ? Gastrointestinal bleeding 11/12/2012  ? Melena 11/12/2012  ? Other psychoactive substance dependence, uncomplicated ([place]) 24/81/8590  ? Iron deficiency anemia, unspecified 06/10/2012  ? Moderate protein-calorie malnutrition ([place]) 07/29/2009  ? Coagulation defect, unspecified ([place]) 08/29/2006  ? Type 2 diabetes mellitus with diabetic  peripheral angiopathy without gangrene ([place]) 08/29/2006  ? 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 stage renal disease ([place]) 08/02/2006  ? ? ?REFERRING DIAG: M25.561 (I

## 2021-05-10 DIAGNOSIS — E875 Hyperkalemia: Secondary | ICD-10-CM | POA: Diagnosis not present

## 2021-05-10 DIAGNOSIS — Z7682 Awaiting organ transplant status: Secondary | ICD-10-CM | POA: Diagnosis not present

## 2021-05-10 DIAGNOSIS — D689 Coagulation defect, unspecified: Secondary | ICD-10-CM | POA: Diagnosis not present

## 2021-05-10 DIAGNOSIS — D509 Iron deficiency anemia, unspecified: Secondary | ICD-10-CM | POA: Diagnosis not present

## 2021-05-10 DIAGNOSIS — Z992 Dependence on renal dialysis: Secondary | ICD-10-CM | POA: Diagnosis not present

## 2021-05-10 DIAGNOSIS — N2581 Secondary hyperparathyroidism of renal origin: Secondary | ICD-10-CM | POA: Diagnosis not present

## 2021-05-10 DIAGNOSIS — N186 End stage renal disease: Secondary | ICD-10-CM | POA: Diagnosis not present

## 2021-05-10 DIAGNOSIS — D631 Anemia in chronic kidney disease: Secondary | ICD-10-CM | POA: Diagnosis not present

## 2021-05-12 ENCOUNTER — Encounter: Payer: Self-pay | Admitting: Rehabilitative and Restorative Service Providers"

## 2021-05-12 ENCOUNTER — Ambulatory Visit: Payer: Medicare Other | Admitting: Rehabilitative and Restorative Service Providers"

## 2021-05-12 DIAGNOSIS — M25562 Pain in left knee: Secondary | ICD-10-CM | POA: Diagnosis not present

## 2021-05-12 DIAGNOSIS — I12 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 stage renal disease: Secondary | ICD-10-CM | POA: Diagnosis not present

## 2021-05-12 DIAGNOSIS — G8929 Other chronic pain: Secondary | ICD-10-CM | POA: Diagnosis not present

## 2021-05-12 DIAGNOSIS — M25652 Stiffness of left hip, not elsewhere classified: Secondary | ICD-10-CM | POA: Diagnosis not present

## 2021-05-12 DIAGNOSIS — M25552 Pain in left hip: Secondary | ICD-10-CM | POA: Diagnosis not present

## 2021-05-12 DIAGNOSIS — M6281 Muscle weakness (generalized): Secondary | ICD-10-CM | POA: Diagnosis not present

## 2021-05-12 DIAGNOSIS — M25611 Stiffness of right shoulder, not elsewhere classified: Secondary | ICD-10-CM | POA: Diagnosis not present

## 2021-05-12 DIAGNOSIS — D689 Coagulation defect, unspecified: Secondary | ICD-10-CM | POA: Diagnosis not present

## 2021-05-12 DIAGNOSIS — R2689 Other abnormalities of gait and mobility: Secondary | ICD-10-CM

## 2021-05-12 DIAGNOSIS — N2581 Secondary hyperparathyroidism of renal origin: Secondary | ICD-10-CM | POA: Diagnosis not present

## 2021-05-12 DIAGNOSIS — M25561 Pain in right knee: Secondary | ICD-10-CM | POA: Diagnosis not present

## 2021-05-12 DIAGNOSIS — N186 End stage renal disease: Secondary | ICD-10-CM | POA: Diagnosis not present

## 2021-05-12 DIAGNOSIS — E875 Hyperkalemia: Secondary | ICD-10-CM | POA: Diagnosis not present

## 2021-05-12 DIAGNOSIS — Z992 Dependence on renal dialysis: Secondary | ICD-10-CM | POA: Diagnosis not present

## 2021-05-12 NOTE — Therapy (Signed)
[place] ?[facility] ?[place], [place], [address] ?Phone: [phone]   Fax:  [phone] ? ?Patient Details  ?Name: Carl Isenberg Sr. ?MRN: [id number] ?Date of Birth: Aug 01, 1960 ?Referring Provider:  [name], MD ? ?Encounter Date: 05/12/2021 ? ? ?OUTPATIENT PHYSICAL THERAPY TREATMENT NOTE ? ? ?Patient Name: Carl Asbridge Sr. ?MRN: [id number] ?DOB:October 19, 1960, 61 y.o., male ?Today's Date: 05/12/2021 ? ?PCP: [name], MD ?REFERRING PROVIDER: [name], MD ? ? PT End of Session - 05/12/21 1017   ? ? Visit Number 24   ? Date for PT Re-Evaluation 06/02/21   ? Authorization Type UHC Medicare   ? Progress Note Due on Visit 30   ? PT Start Time 1015   ? PT Stop Time 1055   ? PT Time Calculation (min) 40 min   ? Activity Tolerance Patient tolerated treatment well   ? Behavior During Therapy [facility] for tasks assessed/performed   ? ?  ?  ? ?  ? ? ? ?Past Medical History:  ?Diagnosis Date  ? Acute gastric ulcer with hemorrhage   ? Acute GI bleeding 07/31/2019  ? Acute pericarditis   ? Anemia   ? hx of  ? Anxiety   ? situational   ? Arthritis   ? on meds  ? Borderline diabetes   ? Cataract   ? bilateral sx  ? Chronic headaches   ? Depression   ? situational   ? Diverticulitis   ? ESRD (end stage renal disease) ([place])   ?  On Renal Transplant List," [facility]; MWF" (10/23/2016)  ? GERD (gastroesophageal reflux disease)   ? with certain foods  ? GI bleed   ? Hypertension   ? diet controlled  ? Parathyroid abnormality ([place])   ? ectopic parathyroid gland  ? Presence of arteriovenous fistula for hemodialysis, primary ([place])   ? RUE  ? Refusal of blood product   ? NO WHOLE BLOOD PROUCTS  ? Renal cell carcinoma ([place])   ? s/p hand assisted laparoscopic bilateral nephrectomies 11/29/17, + RCC left  ? Secondary hyperparathyroidism ([place])   ? Seizures ([place])   ? one episode in past, due to" elevated Potassium" 08/02/20- "at least 4 years ago"  ? Sleep apnea   ? doesn't use  CPAP anymore since weight loss  ? Stroke [facility])   ? no residual  ? ?Past Surgical History:  ?Procedure Laterality Date  ? AV FISTULA PLACEMENT Right   ? right arm  ? BIOPSY  08/01/2019  ? Procedure: BIOPSY;  Surgeon: Juanita Craver, MD;  Location: [facility] ENDOSCOPY;  Service: Endoscopy;;  ? BIOPSY  11/18/2020  ? Procedure: BIOPSY;  Surgeon: Daryel November, MD;  Location: [facility] ENDOSCOPY;  Service: Gastroenterology;;  ? CATARACT EXTRACTION W/ INTRAOCULAR LENS  IMPLANT, BILATERAL    ? COLON SURGERY    ? COLONOSCOPY N/A 08/04/2015  ? Procedure: COLONOSCOPY;  Surgeon: Jerene Bears, MD;  Location: [facility] ENDOSCOPY;  Service: Endoscopy;  Laterality: N/A;  ? COLONOSCOPY  2017  ? JMP@ [facility] prep-mass -recall 1 yr  ? COLONOSCOPY WITH PROPOFOL N/A 11/25/2020  ? Procedure: COLONOSCOPY WITH PROPOFOL;  Surgeon: Sharyn Creamer, MD;  Location: [place];  Service: Gastroenterology;  Laterality: N/A;  ? ESOPHAGOGASTRODUODENOSCOPY N/A 08/01/2019  ? Procedure: ESOPHAGOGASTRODUODENOSCOPY (EGD);  Surgeon: Juanita Craver, MD;  Location: [facility] ENDOSCOPY;  Service: Endoscopy;  Laterality: N/A;  ? ESOPHAGOGASTRODUODENOSCOPY N/A 11/18/2020  ? Procedure: ESOPHAGOGASTRODUODENOSCOPY (EGD);  Surgeon: Daryel November, MD;  Location: [facility] ENDOSCOPY;  Service: Gastroenterology;  Laterality: N/A;  ? ESOPHAGOGASTRODUODENOSCOPY (EGD) WITH PROPOFOL N/A 08/04/2019  ? Procedure: ESOPHAGOGASTRODUODENOSCOPY (EGD) WITH PROPOFOL;  Surgeon: Yetta Flock, MD;  Location: [place];  Service: Gastroenterology;  Laterality: N/A;  ? graft left arm Left   ? for dialysis x 2. Removed  ? HOT HEMOSTASIS  11/18/2020  ? Procedure: HOT HEMOSTASIS (ARGON PLASMA COAGULATION/BICAP);  Surgeon: Daryel November, MD;  Location: [place];  Service: Gastroenterology;;  ? INSERTION OF DIALYSIS CATHETER    ? Rt chest  ? LAPAROSCOPIC RIGHT COLECTOMY N/A 08/05/2015  ? Procedure: LAPAROSCOPIC RIGHT COLECTOMY- ASCENDING;  Surgeon: Stark Klein, MD;  Location: [place];   Service: General;  Laterality: N/A;  ? MASS EXCISION Left 05/28/2019  ? Procedure: EXCISION SOFT TISSUE MASS LEFT SHOULDER;  Surgeon: Armandina Gemma, MD;  Location[facility];  Service: General;  Laterality: Left;  ? NEPHRECTOMY Bilateral   ? PARATHYROIDECTOMY N/A 06/12/2016  ? Procedure: TOTAL PARATHYROIDECTOMY WITH AUTOTRANSPLANTATION TO LEFT FOREARM;  Surgeon: Armandina Gemma, MD;  Location: [place];  Service: General;  Laterality: N/A;  ? PARATHYROIDECTOMY N/A 10/23/2016  ? Procedure: PARATHYROIDECTOMY;  Surgeon: Armandina Gemma, MD;  Location: [place];  Service: General;  Laterality: N/A;  ? REVERSE SHOULDER ARTHROPLASTY Right 08/24/2020  ? Procedure: REVERSE SHOULDER ARTHROPLASTY;  Surgeon: Hiram Gash, MD;  Location: [place];  Service: Orthopedics;  Laterality: Right;  ? REVISON OF ARTERIOVENOUS FISTULA Right 07/16/2017  ? Procedure: REVISION OF ARTERIOVENOUS FISTULA  Right ARM;  Surgeon: Waynetta Sandy, MD;  Location: [place];  Service: Vascular;  Laterality: Right;  ? TOTAL HIP ARTHROPLASTY Left 11/14/2020  ? Procedure: LEFT TOTAL HIP ARTHROPLASTY ANTERIOR APPROACH;  Surgeon: Leandrew Koyanagi, MD;  Location: [place];  Service: Orthopedics;  Laterality: Left;  ? UPPER GASTROINTESTINAL ENDOSCOPY  2021  ? @ [facility]  ? ?Patient Active Problem List  ? Diagnosis Date Noted  ? Mucosal abnormality of esophagus   ? Mucosal abnormality of stomach   ? Mucosal abnormality of duodenum   ? Upper GI bleed   ? Left hip pain 11/16/2020  ? Type 2 diabetes mellitus with other specified complication ([place]) 26/83/4196  ? 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/14/2020  ? Primary osteoarthritis of left hip 10/07/2020  ? Status post reverse total arthroplasty of right shoulder 08/24/2020  ? Diverticulitis 03/08/2020  ? ESRD on hemodialysis ([place]) 03/08/2020  ? Polyneuropathy in diseases classified elsewhere ([place]) 03/08/2020  ? Pure hypercholesterolemia 03/08/2020  ? Sacral back pain 03/08/2020  ? 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 Acute gastric ulcer with hemorrhage   ? Hypotension of hemodialysis 07/31/2019  ? Diverticulitis large intestine 07/31/2019  ? Mass of soft tissue of shoulder 05/26/2019  ? Awaiting organ transplant status 03/18/2019  ? Finding of cocaine in blood 02/25/2019  ? Anaphylactic shock, unspecified, sequela 11/21/2018  ? Hypercalcemia 10/28/2018  ? Diarrhea, unspecified 08/08/2018  ? Hyperkalemia 03/12/2018  ? Patient is Jehovah's Witness 01/30/2018  ? Renal cell carcinoma of left kidney ([place]) 12/24/2017  ? Fluid overload, unspecified 07/06/2017  ? 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 Other specified disorders of kidney and ureter 11/22/2016  ? Secondary hyperparathyroidism of renal origin ([place]) 10/23/2016  ? Other headache syndrome 08/21/2016  ? TIA (transient ischemic attack)   ? Essential hypertension   ? Seizures ([place])   ? 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 Lung nodule   ? Diverticulitis of cecum 07/21/2015  ? Diverticulitis of large  intestine without perforation or abscess without bleeding   ? Right lower quadrant pain   ? Obstructive sleep apnea 02/08/2015  ? 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 Diverticulosis 07/13/2014  ? Lower GI bleed 07/12/2014  ? Refusal of blood transfusions as patient is Jehovah's Witness   ? Parotid mass   ? 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 HLD (hyperlipidemia)   ? PRES (posterior reversible encephalopathy syndrome)   ? History of lacunar cerebrovascular accident (CVA) 05/23/2014  ? Anemia of renal disease 05/23/2014  ? GERD (gastroesophageal reflux disease) 12/09/2012  ? Gastrointestinal bleeding 11/12/2012  ? Melena 11/12/2012  ? Other psychoactive substance dependence, uncomplicated ([place]) 75/79/7282  ? Iron deficiency anemia, unspecified 06/10/2012  ? Moderate protein-calorie malnutrition ([place]) 07/29/2009  ? Coagulation defect, unspecified ([place]) 08/29/2006  ? Type 2 diabetes mellitus with diabetic  peripheral angiopathy without gangrene ([place]) 08/29/2006  ? 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 stage renal disease ([place]) 08/02/2006  ? ? ?REFERRING DIAG: M25.561 (I

## 2021-05-15 ENCOUNTER — Ambulatory Visit: Payer: Medicare Other | Attending: Family Medicine

## 2021-05-15 DIAGNOSIS — R2689 Other abnormalities of gait and mobility: Secondary | ICD-10-CM

## 2021-05-15 DIAGNOSIS — G8929 Other chronic pain: Secondary | ICD-10-CM

## 2021-05-15 DIAGNOSIS — D509 Iron deficiency anemia, unspecified: Secondary | ICD-10-CM | POA: Diagnosis not present

## 2021-05-15 DIAGNOSIS — M25562 Pain in left knee: Secondary | ICD-10-CM | POA: Diagnosis not present

## 2021-05-15 DIAGNOSIS — R197 Diarrhea, unspecified: Secondary | ICD-10-CM | POA: Diagnosis not present

## 2021-05-15 DIAGNOSIS — Z992 Dependence on renal dialysis: Secondary | ICD-10-CM | POA: Diagnosis not present

## 2021-05-15 DIAGNOSIS — M25552 Pain in left hip: Secondary | ICD-10-CM

## 2021-05-15 DIAGNOSIS — D631 Anemia in chronic kidney disease: Secondary | ICD-10-CM | POA: Diagnosis not present

## 2021-05-15 DIAGNOSIS — M25652 Stiffness of left hip, not elsewhere classified: Secondary | ICD-10-CM | POA: Diagnosis not present

## 2021-05-15 DIAGNOSIS — M6281 Muscle weakness (generalized): Secondary | ICD-10-CM

## 2021-05-15 DIAGNOSIS — N2581 Secondary hyperparathyroidism of renal origin: Secondary | ICD-10-CM | POA: Diagnosis not present

## 2021-05-15 DIAGNOSIS — D689 Coagulation defect, unspecified: Secondary | ICD-10-CM | POA: Diagnosis not present

## 2021-05-15 DIAGNOSIS — Z7682 Awaiting organ transplant status: Secondary | ICD-10-CM | POA: Diagnosis not present

## 2021-05-15 DIAGNOSIS — R519 Headache, unspecified: Secondary | ICD-10-CM | POA: Diagnosis not present

## 2021-05-15 DIAGNOSIS — N186 End stage renal disease: Secondary | ICD-10-CM | POA: Diagnosis not present

## 2021-05-15 DIAGNOSIS — M25611 Stiffness of right shoulder, not elsewhere classified: Secondary | ICD-10-CM

## 2021-05-15 DIAGNOSIS — M25561 Pain in right knee: Secondary | ICD-10-CM | POA: Diagnosis not present

## 2021-05-15 NOTE — Therapy (Signed)
[place] ?[place] [facility] [place] ?[place], [place], [address] ?Phone: [phone]   Fax:  [phone] ? ?Patient Details  ?Name: Carl Caamano Sr. ?MRN: [id number] ?Date of Birth: March 10, 1960 ?Referring Provider:  [name], MD ? ?Encounter Date: 05/15/2021 ? ? ?OUTPATIENT PHYSICAL THERAPY TREATMENT NOTE ? ? ?Patient Name: Carl Mayeda Sr. ?MRN: [id number] ?DOB:1960/12/06, 61 y.o., male ?Today's Date: 05/15/2021 ? ?PCP: [name], MD ?REFERRING PROVIDER: [name], MD ? ? PT End of Session - 05/15/21 1057   ? ? Visit Number 25   ? Date for PT Re-Evaluation 06/02/21   ? Authorization Type UHC Medicare   ? Progress Note Due on Visit 30   ? PT Start Time 1017   ? PT Stop Time 1059   ? PT Time Calculation (min) 42 min   ? Activity Tolerance Patient tolerated treatment well   ? Behavior During Therapy [facility] for tasks assessed/performed   ? ?  ?  ? ?  ? ? ? ? ?Past Medical History:  ?Diagnosis Date  ? Acute gastric ulcer with hemorrhage   ? Acute GI bleeding 07/31/2019  ? Acute pericarditis   ? Anemia   ? hx of  ? Anxiety   ? situational   ? Arthritis   ? on meds  ? Borderline diabetes   ? Cataract   ? bilateral sx  ? Chronic headaches   ? Depression   ? situational   ? Diverticulitis   ? ESRD (end stage renal disease) ([place])   ?  On Renal Transplant List," [facility]; MWF" (10/23/2016)  ? GERD (gastroesophageal reflux disease)   ? with certain foods  ? GI bleed   ? Hypertension   ? diet controlled  ? Parathyroid abnormality ([place])   ? ectopic parathyroid gland  ? Presence of arteriovenous fistula for hemodialysis, primary ([place])   ? RUE  ? Refusal of blood product   ? NO WHOLE BLOOD PROUCTS  ? Renal cell carcinoma ([place])   ? s/p hand assisted laparoscopic bilateral nephrectomies 11/29/17, + RCC left  ? Secondary hyperparathyroidism ([place])   ? Seizures ([place])   ? one episode in past, due to" elevated Potassium" 08/02/20- "at least 4 years ago"  ? Sleep apnea   ? doesn't use  CPAP anymore since weight loss  ? Stroke [facility])   ? no residual  ? ?Past Surgical History:  ?Procedure Laterality Date  ? AV FISTULA PLACEMENT Right   ? right arm  ? BIOPSY  08/01/2019  ? Procedure: BIOPSY;  Surgeon: Juanita Craver, MD;  Location: [facility] ENDOSCOPY;  Service: Endoscopy;;  ? BIOPSY  11/18/2020  ? Procedure: BIOPSY;  Surgeon: Daryel November, MD;  Location: [facility] ENDOSCOPY;  Service: Gastroenterology;;  ? CATARACT EXTRACTION W/ INTRAOCULAR LENS  IMPLANT, BILATERAL    ? COLON SURGERY    ? COLONOSCOPY N/A 08/04/2015  ? Procedure: COLONOSCOPY;  Surgeon: Jerene Bears, MD;  Location: [facility] ENDOSCOPY;  Service: Endoscopy;  Laterality: N/A;  ? COLONOSCOPY  2017  ? JMP@ [facility]-good prep-mass -recall 1 yr  ? COLONOSCOPY WITH PROPOFOL N/A 11/25/2020  ? Procedure: COLONOSCOPY WITH PROPOFOL;  Surgeon: Sharyn Creamer, MD;  Location: [place];  Service: Gastroenterology;  Laterality: N/A;  ? ESOPHAGOGASTRODUODENOSCOPY N/A 08/01/2019  ? Procedure: ESOPHAGOGASTRODUODENOSCOPY (EGD);  Surgeon: Juanita Craver, MD;  Location: [facility] ENDOSCOPY;  Service: Endoscopy;  Laterality: N/A;  ? ESOPHAGOGASTRODUODENOSCOPY N/A 11/18/2020  ? Procedure: ESOPHAGOGASTRODUODENOSCOPY (EGD);  Surgeon: Daryel November, MD;  Location: [facility] ENDOSCOPY;  Service: Gastroenterology;  Laterality: N/A;  ? ESOPHAGOGASTRODUODENOSCOPY (EGD) WITH PROPOFOL N/A 08/04/2019  ? Procedure: ESOPHAGOGASTRODUODENOSCOPY (EGD) WITH PROPOFOL;  Surgeon: Yetta Flock, MD;  Location: [place];  Service: Gastroenterology;  Laterality: N/A;  ? graft left arm Left   ? for dialysis x 2. Removed  ? HOT HEMOSTASIS  11/18/2020  ? Procedure: HOT HEMOSTASIS (ARGON PLASMA COAGULATION/BICAP);  Surgeon: Daryel November, MD;  Location: [place];  Service: Gastroenterology;;  ? INSERTION OF DIALYSIS CATHETER    ? Rt chest  ? LAPAROSCOPIC RIGHT COLECTOMY N/A 08/05/2015  ? Procedure: LAPAROSCOPIC RIGHT COLECTOMY- ASCENDING;  Surgeon: Stark Klein, MD;  Location: [place];   Service: General;  Laterality: N/A;  ? MASS EXCISION Left 05/28/2019  ? Procedure: EXCISION SOFT TISSUE MASS LEFT SHOULDER;  Surgeon: Armandina Gemma, MD;  Location: [facility];  Service: General;  Laterality: Left;  ? NEPHRECTOMY Bilateral   ? PARATHYROIDECTOMY N/A 06/12/2016  ? Procedure: TOTAL PARATHYROIDECTOMY WITH AUTOTRANSPLANTATION TO LEFT FOREARM;  Surgeon: Armandina Gemma, MD;  Location: [place];  Service: General;  Laterality: N/A;  ? PARATHYROIDECTOMY N/A 10/23/2016  ? Procedure: PARATHYROIDECTOMY;  Surgeon: Armandina Gemma, MD;  Location: [place];  Service: General;  Laterality: N/A;  ? REVERSE SHOULDER ARTHROPLASTY Right 08/24/2020  ? Procedure: REVERSE SHOULDER ARTHROPLASTY;  Surgeon: Hiram Gash, MD;  Location: [place];  Service: Orthopedics;  Laterality: Right;  ? REVISON OF ARTERIOVENOUS FISTULA Right 07/16/2017  ? Procedure: REVISION OF ARTERIOVENOUS FISTULA  Right ARM;  Surgeon: Waynetta Sandy, MD;  Location: [place];  Service: Vascular;  Laterality: Right;  ? TOTAL HIP ARTHROPLASTY Left 11/14/2020  ? Procedure: LEFT TOTAL HIP ARTHROPLASTY ANTERIOR APPROACH;  Surgeon: Leandrew Koyanagi, MD;  Location: [place];  Service: Orthopedics;  Laterality: Left;  ? UPPER GASTROINTESTINAL ENDOSCOPY  2021  ? @ [facility]  ? ?Patient Active Problem List  ? Diagnosis Date Noted  ? Mucosal abnormality of esophagus   ? Mucosal abnormality of stomach   ? Mucosal abnormality of duodenum   ? Upper GI bleed   ? Left hip pain 11/16/2020  ? Type 2 diabetes mellitus with other specified complication ([place]) 77/82/4235  ? 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/14/2020  ? Primary osteoarthritis of left hip 10/07/2020  ? Status post reverse total arthroplasty of right shoulder 08/24/2020  ? Diverticulitis 03/08/2020  ? ESRD on hemodialysis ([place]) 03/08/2020  ? Polyneuropathy in diseases classified elsewhere ([place]) 03/08/2020  ? Pure hypercholesterolemia 03/08/2020  ? Sacral back pain 03/08/2020  ? 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 Acute gastric ulcer with hemorrhage   ? Hypotension of hemodialysis 07/31/2019  ? Diverticulitis large intestine 07/31/2019  ? Mass of soft tissue of shoulder 05/26/2019  ? Awaiting organ transplant status 03/18/2019  ? Finding of cocaine in blood 02/25/2019  ? Anaphylactic shock, unspecified, sequela 11/21/2018  ? Hypercalcemia 10/28/2018  ? Diarrhea, unspecified 08/08/2018  ? Hyperkalemia 03/12/2018  ? Patient is Jehovah's Witness 01/30/2018  ? Renal cell carcinoma of left kidney ([place]) 12/24/2017  ? Fluid overload, unspecified 07/06/2017  ? 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 Other specified disorders of kidney and ureter 11/22/2016  ? Secondary hyperparathyroidism of renal origin ([place]) 10/23/2016  ? Other headache syndrome 08/21/2016  ? TIA (transient ischemic attack)   ? Essential hypertension   ? Seizures ([place])   ? 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 Lung nodule   ? Diverticulitis of cecum 07/21/2015  ? Diverticulitis of large  intestine without perforation or abscess without bleeding   ? Right lower quadrant pain   ? Obstructive sleep apnea 02/08/2015  ? 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 Diverticulosis 07/13/2014  ? Lower GI bleed 07/12/2014  ? Refusal of blood transfusions as patient is Jehovah's Witness   ? Parotid mass   ? 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 HLD (hyperlipidemia)   ? PRES (posterior reversible encephalopathy syndrome)   ? History of lacunar cerebrovascular accident (CVA) 05/23/2014  ? Anemia of renal disease 05/23/2014  ? GERD (gastroesophageal reflux disease) 12/09/2012  ? Gastrointestinal bleeding 11/12/2012  ? Melena 11/12/2012  ? Other psychoactive substance dependence, uncomplicated ([place]) 58/85/0277  ? Iron deficiency anemia, unspecified 06/10/2012  ? Moderate protein-calorie malnutrition ([place]) 07/29/2009  ? Coagulation defect, unspecified ([place]) 08/29/2006  ? Type 2 diabetes mellitus with diabetic  peripheral angiopathy without gangrene ([place]) 08/29/2006  ? 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 stage renal disease ([place]) 08/02/2006  ? ? ?REFERRING DIAG: M25.561 (I

## 2021-05-16 DIAGNOSIS — N2581 Secondary hyperparathyroidism of renal origin: Secondary | ICD-10-CM | POA: Diagnosis not present

## 2021-05-16 DIAGNOSIS — Z992 Dependence on renal dialysis: Secondary | ICD-10-CM | POA: Diagnosis not present

## 2021-05-16 DIAGNOSIS — N186 End stage renal disease: Secondary | ICD-10-CM | POA: Diagnosis not present

## 2021-05-17 DIAGNOSIS — N2581 Secondary hyperparathyroidism of renal origin: Secondary | ICD-10-CM | POA: Diagnosis not present

## 2021-05-17 DIAGNOSIS — D689 Coagulation defect, unspecified: Secondary | ICD-10-CM | POA: Diagnosis not present

## 2021-05-17 DIAGNOSIS — D631 Anemia in chronic kidney disease: Secondary | ICD-10-CM | POA: Diagnosis not present

## 2021-05-17 DIAGNOSIS — D509 Iron deficiency anemia, unspecified: Secondary | ICD-10-CM | POA: Diagnosis not present

## 2021-05-17 DIAGNOSIS — N186 End stage renal disease: Secondary | ICD-10-CM | POA: Diagnosis not present

## 2021-05-17 DIAGNOSIS — Z992 Dependence on renal dialysis: Secondary | ICD-10-CM | POA: Diagnosis not present

## 2021-05-17 DIAGNOSIS — R197 Diarrhea, unspecified: Secondary | ICD-10-CM | POA: Diagnosis not present

## 2021-05-17 DIAGNOSIS — Z7682 Awaiting organ transplant status: Secondary | ICD-10-CM | POA: Diagnosis not present

## 2021-05-17 DIAGNOSIS — R519 Headache, unspecified: Secondary | ICD-10-CM | POA: Diagnosis not present

## 2021-05-18 ENCOUNTER — Ambulatory Visit: Payer: Medicare Other | Admitting: Rehabilitative and Restorative Service Providers"

## 2021-05-19 DIAGNOSIS — N186 End stage renal disease: Secondary | ICD-10-CM | POA: Diagnosis not present

## 2021-05-19 DIAGNOSIS — R519 Headache, unspecified: Secondary | ICD-10-CM | POA: Diagnosis not present

## 2021-05-19 DIAGNOSIS — D631 Anemia in chronic kidney disease: Secondary | ICD-10-CM | POA: Diagnosis not present

## 2021-05-19 DIAGNOSIS — D509 Iron deficiency anemia, unspecified: Secondary | ICD-10-CM | POA: Diagnosis not present

## 2021-05-19 DIAGNOSIS — R197 Diarrhea, unspecified: Secondary | ICD-10-CM | POA: Diagnosis not present

## 2021-05-19 DIAGNOSIS — Z7682 Awaiting organ transplant status: Secondary | ICD-10-CM | POA: Diagnosis not present

## 2021-05-19 DIAGNOSIS — Z992 Dependence on renal dialysis: Secondary | ICD-10-CM | POA: Diagnosis not present

## 2021-05-19 DIAGNOSIS — D689 Coagulation defect, unspecified: Secondary | ICD-10-CM | POA: Diagnosis not present

## 2021-05-19 DIAGNOSIS — N2581 Secondary hyperparathyroidism of renal origin: Secondary | ICD-10-CM | POA: Diagnosis not present

## 2021-05-22 DIAGNOSIS — D689 Coagulation defect, unspecified: Secondary | ICD-10-CM | POA: Diagnosis not present

## 2021-05-22 DIAGNOSIS — N2581 Secondary hyperparathyroidism of renal origin: Secondary | ICD-10-CM | POA: Diagnosis not present

## 2021-05-22 DIAGNOSIS — D631 Anemia in chronic kidney disease: Secondary | ICD-10-CM | POA: Diagnosis not present

## 2021-05-22 DIAGNOSIS — Z992 Dependence on renal dialysis: Secondary | ICD-10-CM | POA: Diagnosis not present

## 2021-05-22 DIAGNOSIS — Z7682 Awaiting organ transplant status: Secondary | ICD-10-CM | POA: Diagnosis not present

## 2021-05-22 DIAGNOSIS — D509 Iron deficiency anemia, unspecified: Secondary | ICD-10-CM | POA: Diagnosis not present

## 2021-05-22 DIAGNOSIS — R519 Headache, unspecified: Secondary | ICD-10-CM | POA: Diagnosis not present

## 2021-05-22 DIAGNOSIS — R197 Diarrhea, unspecified: Secondary | ICD-10-CM | POA: Diagnosis not present

## 2021-05-22 DIAGNOSIS — N186 End stage renal disease: Secondary | ICD-10-CM | POA: Diagnosis not present

## 2021-05-24 DIAGNOSIS — D631 Anemia in chronic kidney disease: Secondary | ICD-10-CM | POA: Diagnosis not present

## 2021-05-24 DIAGNOSIS — R519 Headache, unspecified: Secondary | ICD-10-CM | POA: Diagnosis not present

## 2021-05-24 DIAGNOSIS — D509 Iron deficiency anemia, unspecified: Secondary | ICD-10-CM | POA: Diagnosis not present

## 2021-05-24 DIAGNOSIS — D689 Coagulation defect, unspecified: Secondary | ICD-10-CM | POA: Diagnosis not present

## 2021-05-24 DIAGNOSIS — Z7682 Awaiting organ transplant status: Secondary | ICD-10-CM | POA: Diagnosis not present

## 2021-05-24 DIAGNOSIS — Z992 Dependence on renal dialysis: Secondary | ICD-10-CM | POA: Diagnosis not present

## 2021-05-24 DIAGNOSIS — N186 End stage renal disease: Secondary | ICD-10-CM | POA: Diagnosis not present

## 2021-05-24 DIAGNOSIS — R197 Diarrhea, unspecified: Secondary | ICD-10-CM | POA: Diagnosis not present

## 2021-05-24 DIAGNOSIS — N2581 Secondary hyperparathyroidism of renal origin: Secondary | ICD-10-CM | POA: Diagnosis not present

## 2021-05-26 DIAGNOSIS — N186 End stage renal disease: Secondary | ICD-10-CM | POA: Diagnosis not present

## 2021-05-26 DIAGNOSIS — R519 Headache, unspecified: Secondary | ICD-10-CM | POA: Diagnosis not present

## 2021-05-26 DIAGNOSIS — Z992 Dependence on renal dialysis: Secondary | ICD-10-CM | POA: Diagnosis not present

## 2021-05-26 DIAGNOSIS — D689 Coagulation defect, unspecified: Secondary | ICD-10-CM | POA: Diagnosis not present

## 2021-05-26 DIAGNOSIS — N2581 Secondary hyperparathyroidism of renal origin: Secondary | ICD-10-CM | POA: Diagnosis not present

## 2021-05-26 DIAGNOSIS — D631 Anemia in chronic kidney disease: Secondary | ICD-10-CM | POA: Diagnosis not present

## 2021-05-26 DIAGNOSIS — Z7682 Awaiting organ transplant status: Secondary | ICD-10-CM | POA: Diagnosis not present

## 2021-05-26 DIAGNOSIS — D509 Iron deficiency anemia, unspecified: Secondary | ICD-10-CM | POA: Diagnosis not present

## 2021-05-26 DIAGNOSIS — R197 Diarrhea, unspecified: Secondary | ICD-10-CM | POA: Diagnosis not present

## 2021-05-29 ENCOUNTER — Encounter: Payer: Self-pay | Admitting: Rehabilitative and Restorative Service Providers"

## 2021-05-29 ENCOUNTER — Ambulatory Visit: Payer: Medicare Other | Admitting: Rehabilitative and Restorative Service Providers"

## 2021-05-29 DIAGNOSIS — R2689 Other abnormalities of gait and mobility: Secondary | ICD-10-CM

## 2021-05-29 DIAGNOSIS — G8929 Other chronic pain: Secondary | ICD-10-CM

## 2021-05-29 DIAGNOSIS — R197 Diarrhea, unspecified: Secondary | ICD-10-CM | POA: Diagnosis not present

## 2021-05-29 DIAGNOSIS — M6281 Muscle weakness (generalized): Secondary | ICD-10-CM

## 2021-05-29 DIAGNOSIS — M25611 Stiffness of right shoulder, not elsewhere classified: Secondary | ICD-10-CM

## 2021-05-29 DIAGNOSIS — D689 Coagulation defect, unspecified: Secondary | ICD-10-CM | POA: Diagnosis not present

## 2021-05-29 DIAGNOSIS — M25552 Pain in left hip: Secondary | ICD-10-CM

## 2021-05-29 DIAGNOSIS — M25562 Pain in left knee: Secondary | ICD-10-CM | POA: Diagnosis not present

## 2021-05-29 DIAGNOSIS — D631 Anemia in chronic kidney disease: Secondary | ICD-10-CM | POA: Diagnosis not present

## 2021-05-29 DIAGNOSIS — M25561 Pain in right knee: Secondary | ICD-10-CM | POA: Diagnosis not present

## 2021-05-29 DIAGNOSIS — D509 Iron deficiency anemia, unspecified: Secondary | ICD-10-CM | POA: Diagnosis not present

## 2021-05-29 DIAGNOSIS — R519 Headache, unspecified: Secondary | ICD-10-CM | POA: Diagnosis not present

## 2021-05-29 DIAGNOSIS — M25652 Stiffness of left hip, not elsewhere classified: Secondary | ICD-10-CM

## 2021-05-29 DIAGNOSIS — N186 End stage renal disease: Secondary | ICD-10-CM | POA: Diagnosis not present

## 2021-05-29 DIAGNOSIS — N2581 Secondary hyperparathyroidism of renal origin: Secondary | ICD-10-CM | POA: Diagnosis not present

## 2021-05-29 DIAGNOSIS — Z992 Dependence on renal dialysis: Secondary | ICD-10-CM | POA: Diagnosis not present

## 2021-05-29 DIAGNOSIS — Z7682 Awaiting organ transplant status: Secondary | ICD-10-CM | POA: Diagnosis not present

## 2021-05-29 NOTE — Therapy (Signed)
? ?OUTPATIENT PHYSICAL THERAPY TREATMENT NOTE ? ? ?Patient Name: Carl Suazo Sr. ?MRN: [id number] ?DOB:May 19, 1960, 61 y.o., male ?Today's Date: 05/29/2021 ? ?PCP: [name], MD ?REFERRING PROVIDER: [name], MD ? ? PT End of Session - 05/29/21 1153   ? ? Visit Number 26   ? Date for PT Re-Evaluation 06/02/21   ? Authorization Type UHC Medicare   ? Progress Note Due on Visit 30   ? PT Start Time 1151   ? PT Stop Time 1230   ? PT Time Calculation (min) 39 min   ? Activity Tolerance Patient tolerated treatment well   ? Behavior During Therapy [facility] for tasks assessed/performed   ? ?  ?  ? ?  ? ? ? ? ?Past Medical History:  ?Diagnosis Date  ? Acute gastric ulcer with hemorrhage   ? Acute GI bleeding 07/31/2019  ? Acute pericarditis   ? Anemia   ? hx of  ? Anxiety   ? situational   ? Arthritis   ? on meds  ? Borderline diabetes   ? Cataract   ? bilateral sx  ? Chronic headaches   ? Depression   ? situational   ? Diverticulitis   ? ESRD (end stage renal disease) ([place])   ?  On Renal Transplant List," [facility]; MWF" (10/23/2016)  ? GERD (gastroesophageal reflux disease)   ? with certain foods  ? GI bleed   ? Hypertension   ? diet controlled  ? Parathyroid abnormality ([place])   ? ectopic parathyroid gland  ? Presence of arteriovenous fistula for hemodialysis, primary ([place])   ? RUE  ? Refusal of blood product   ? NO WHOLE BLOOD PROUCTS  ? Renal cell carcinoma ([place])   ? s/p hand assisted laparoscopic bilateral nephrectomies 11/29/17, + RCC left  ? Secondary hyperparathyroidism ([place])   ? Seizures ([place])   ? one episode in past, due to" elevated Potassium" 08/02/20- "at least 4 years ago"  ? Sleep apnea   ? doesn't use CPAP anymore since weight loss  ? Stroke [facility])   ? no residual  ? ?Past Surgical History:  ?Procedure Laterality Date  ? AV FISTULA PLACEMENT Right   ? right arm  ? BIOPSY  08/01/2019  ? Procedure: BIOPSY;  Surgeon: Juanita Craver, MD;  Location: [facility] ENDOSCOPY;  Service: Endoscopy;;  ? BIOPSY  11/18/2020  ?  Procedure: BIOPSY;  Surgeon: Daryel November, MD;  Location: [facility] ENDOSCOPY;  Service: Gastroenterology;;  ? CATARACT EXTRACTION W/ INTRAOCULAR LENS  IMPLANT, BILATERAL    ? COLON SURGERY    ? COLONOSCOPY N/A 08/04/2015  ? Procedure: COLONOSCOPY;  Surgeon: Jerene Bears, MD;  Location: [facility] ENDOSCOPY;  Service: Endoscopy;  Laterality: N/A;  ? COLONOSCOPY  2017  ? JMP@ [facility]-good prep-mass -recall 1 yr  ? COLONOSCOPY WITH PROPOFOL N/A 11/25/2020  ? Procedure: COLONOSCOPY WITH PROPOFOL;  Surgeon: Sharyn Creamer, MD;  Location: [place];  Service: Gastroenterology;  Laterality: N/A;  ? ESOPHAGOGASTRODUODENOSCOPY N/A 08/01/2019  ? Procedure: ESOPHAGOGASTRODUODENOSCOPY (EGD);  Surgeon: Juanita Craver, MD;  Location: [facility] ENDOSCOPY;  Service: Endoscopy;  Laterality: N/A;  ? ESOPHAGOGASTRODUODENOSCOPY N/A 11/18/2020  ? Procedure: ESOPHAGOGASTRODUODENOSCOPY (EGD);  Surgeon: Daryel November, MD;  Location: [place];  Service: Gastroenterology;  Laterality: N/A;  ? ESOPHAGOGASTRODUODENOSCOPY (EGD) WITH PROPOFOL N/A 08/04/2019  ? Procedure: ESOPHAGOGASTRODUODENOSCOPY (EGD) WITH PROPOFOL;  Surgeon: Yetta Flock, MD;  Location: [place];  Service: Gastroenterology;  Laterality: N/A;  ? graft left arm Left   ? for dialysis  x 2. Removed  ? HOT HEMOSTASIS  11/18/2020  ? Procedure: HOT HEMOSTASIS (ARGON PLASMA COAGULATION/BICAP);  Surgeon: Daryel November, MD;  Location: [place];  Service: Gastroenterology;;  ? INSERTION OF DIALYSIS CATHETER    ? Rt chest  ? LAPAROSCOPIC RIGHT COLECTOMY N/A 08/05/2015  ? Procedure: LAPAROSCOPIC RIGHT COLECTOMY- ASCENDING;  Surgeon: Stark Klein, MD;  Location: [place];  Service: General;  Laterality: N/A;  ? MASS EXCISION Left 05/28/2019  ? Procedure: EXCISION SOFT TISSUE MASS LEFT SHOULDER;  Surgeon: Armandina Gemma, MD;  [facility];  Service: General;  Laterality: Left;  ? NEPHRECTOMY Bilateral   ? PARATHYROIDECTOMY N/A 06/12/2016  ? Procedure: TOTAL PARATHYROIDECTOMY  WITH AUTOTRANSPLANTATION TO LEFT FOREARM;  Surgeon: Armandina Gemma, MD;  Location: [place];  Service: General;  Laterality: N/A;  ? PARATHYROIDECTOMY N/A 10/23/2016  ? Procedure: PARATHYROIDECTOMY;  Surgeon: Armandina Gemma, MD;  Location: [place];  Service: General;  Laterality: N/A;  ? REVERSE SHOULDER ARTHROPLASTY Right 08/24/2020  ? Procedure: REVERSE SHOULDER ARTHROPLASTY;  Surgeon: Hiram Gash, MD;  Location: [place];  Service: Orthopedics;  Laterality: Right;  ? REVISON OF ARTERIOVENOUS FISTULA Right 07/16/2017  ? Procedure: REVISION OF ARTERIOVENOUS FISTULA  Right ARM;  Surgeon: Waynetta Sandy, MD;  Location: [place];  Service: Vascular;  Laterality: Right;  ? TOTAL HIP ARTHROPLASTY Left 11/14/2020  ? Procedure: LEFT TOTAL HIP ARTHROPLASTY ANTERIOR APPROACH;  Surgeon: Leandrew Koyanagi, MD;  Location: [place];  Service: Orthopedics;  Laterality: Left;  ? UPPER GASTROINTESTINAL ENDOSCOPY  2021  ? @ [facility]  ? ?Patient Active Problem List  ? Diagnosis Date Noted  ? Mucosal abnormality of esophagus   ? Mucosal abnormality of stomach   ? Mucosal abnormality of duodenum   ? Upper GI bleed   ? Left hip pain 11/16/2020  ? Type 2 diabetes mellitus with other specified complication ([place]) 38/11/1749  ? 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/14/2020  ? Primary osteoarthritis of left hip 10/07/2020  ? Status post reverse total arthroplasty of right shoulder 08/24/2020  ? Diverticulitis 03/08/2020  ? ESRD on hemodialysis ([place]) 03/08/2020  ? Polyneuropathy in diseases classified elsewhere ([place]) 03/08/2020  ? Pure hypercholesterolemia 03/08/2020  ? Sacral back pain 03/08/2020  ? 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 Acute gastric ulcer with hemorrhage   ? Hypotension of hemodialysis 07/31/2019  ? Diverticulitis large intestine 07/31/2019  ? Mass of soft tissue of shoulder 05/26/2019  ? Awaiting organ transplant status 03/18/2019  ? Finding of cocaine in blood 02/25/2019  ? Anaphylactic shock, unspecified, sequela  11/21/2018  ? Hypercalcemia 10/28/2018  ? Diarrhea, unspecified 08/08/2018  ? Hyperkalemia 03/12/2018  ? Patient is Jehovah's Witness 01/30/2018  ? Renal cell carcinoma of left kidney ([place]) 12/24/2017  ? Fluid overload, unspecified 07/06/2017  ? 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 Other specified disorders of kidney and ureter 11/22/2016  ? Secondary hyperparathyroidism of renal origin ([place]) 10/23/2016  ? Other headache syndrome 08/21/2016  ? TIA (transient ischemic attack)   ? Essential hypertension   ? Seizures ([place])   ? 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 Lung nodule   ? Diverticulitis of cecum 07/21/2015  ? Diverticulitis of large intestine without perforation or abscess without bleeding   ? Right lower quadrant pain   ? Obstructive sleep apnea 02/08/2015  ? 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 Diverticulosis 07/13/2014  ? Lower GI bleed 07/12/2014  ? Refusal of blood transfusions as patient is [employer]  Witness   ? Parotid mass   ? 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 HLD (hyperlipidemia)   ? PRES (posterior reversible encephalopathy syndrome)   ? History of lacunar cerebrovascular accident (CVA) 05/23/2014  ? Anemia of renal disease 05/23/2014  ? GERD (gastroesophageal reflux disease) 12/09/2012  ? Gastrointestinal bleeding 11/12/2012  ? Melena 11/12/2012  ? Other psychoactive substance dependence, uncomplicated ([place]) 03/70/4888  ? Iron deficiency anemia, unspecified 06/10/2012  ? Moderate protein-calorie malnutrition ([place]) 07/29/2009  ? Coagulation defect, unspecified ([place]) 08/29/2006  ? Type 2 diabetes mellitus with diabetic peripheral angiopathy without gangrene ([place]) 08/29/2006  ? 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 stage renal disease ([place]) 08/02/2006  ? ? ?REFERRING DIAG: M25.561 (ICD-10-CM) - Pain in right knee  R29.898 (ICD-10-CM) - Other symptoms and signs involving the  musculoskeletal system  ?Onset date: Lt hip replacement 09/2020 ?  ?THERAPY DIAG:  ?Muscle weakness (generalized) ?  ?Other abnormalities of gait and mobility ?  ?Stiffness of right shoulder, not elsewhere classified

## 2021-05-31 DIAGNOSIS — Z7682 Awaiting organ transplant status: Secondary | ICD-10-CM | POA: Diagnosis not present

## 2021-05-31 DIAGNOSIS — R519 Headache, unspecified: Secondary | ICD-10-CM | POA: Diagnosis not present

## 2021-05-31 DIAGNOSIS — R197 Diarrhea, unspecified: Secondary | ICD-10-CM | POA: Diagnosis not present

## 2021-05-31 DIAGNOSIS — N2581 Secondary hyperparathyroidism of renal origin: Secondary | ICD-10-CM | POA: Diagnosis not present

## 2021-05-31 DIAGNOSIS — D509 Iron deficiency anemia, unspecified: Secondary | ICD-10-CM | POA: Diagnosis not present

## 2021-05-31 DIAGNOSIS — D631 Anemia in chronic kidney disease: Secondary | ICD-10-CM | POA: Diagnosis not present

## 2021-05-31 DIAGNOSIS — D689 Coagulation defect, unspecified: Secondary | ICD-10-CM | POA: Diagnosis not present

## 2021-05-31 DIAGNOSIS — M1612 Unilateral primary osteoarthritis, left hip: Secondary | ICD-10-CM | POA: Diagnosis not present

## 2021-05-31 DIAGNOSIS — Z992 Dependence on renal dialysis: Secondary | ICD-10-CM | POA: Diagnosis not present

## 2021-05-31 DIAGNOSIS — N186 End stage renal disease: Secondary | ICD-10-CM | POA: Diagnosis not present

## 2021-06-02 ENCOUNTER — Ambulatory Visit: Payer: Medicare Other | Admitting: Rehabilitative and Restorative Service Providers"

## 2021-06-02 DIAGNOSIS — R519 Headache, unspecified: Secondary | ICD-10-CM | POA: Diagnosis not present

## 2021-06-02 DIAGNOSIS — N186 End stage renal disease: Secondary | ICD-10-CM | POA: Diagnosis not present

## 2021-06-02 DIAGNOSIS — D689 Coagulation defect, unspecified: Secondary | ICD-10-CM | POA: Diagnosis not present

## 2021-06-02 DIAGNOSIS — R197 Diarrhea, unspecified: Secondary | ICD-10-CM | POA: Diagnosis not present

## 2021-06-02 DIAGNOSIS — N2581 Secondary hyperparathyroidism of renal origin: Secondary | ICD-10-CM | POA: Diagnosis not present

## 2021-06-02 DIAGNOSIS — D509 Iron deficiency anemia, unspecified: Secondary | ICD-10-CM | POA: Diagnosis not present

## 2021-06-02 DIAGNOSIS — Z7682 Awaiting organ transplant status: Secondary | ICD-10-CM | POA: Diagnosis not present

## 2021-06-02 DIAGNOSIS — D631 Anemia in chronic kidney disease: Secondary | ICD-10-CM | POA: Diagnosis not present

## 2021-06-02 DIAGNOSIS — Z992 Dependence on renal dialysis: Secondary | ICD-10-CM | POA: Diagnosis not present

## 2021-06-05 DIAGNOSIS — D631 Anemia in chronic kidney disease: Secondary | ICD-10-CM | POA: Diagnosis not present

## 2021-06-05 DIAGNOSIS — Z992 Dependence on renal dialysis: Secondary | ICD-10-CM | POA: Diagnosis not present

## 2021-06-05 DIAGNOSIS — D689 Coagulation defect, unspecified: Secondary | ICD-10-CM | POA: Diagnosis not present

## 2021-06-05 DIAGNOSIS — N2581 Secondary hyperparathyroidism of renal origin: Secondary | ICD-10-CM | POA: Diagnosis not present

## 2021-06-05 DIAGNOSIS — D509 Iron deficiency anemia, unspecified: Secondary | ICD-10-CM | POA: Diagnosis not present

## 2021-06-05 DIAGNOSIS — N186 End stage renal disease: Secondary | ICD-10-CM | POA: Diagnosis not present

## 2021-06-05 DIAGNOSIS — R519 Headache, unspecified: Secondary | ICD-10-CM | POA: Diagnosis not present

## 2021-06-05 DIAGNOSIS — R197 Diarrhea, unspecified: Secondary | ICD-10-CM | POA: Diagnosis not present

## 2021-06-05 DIAGNOSIS — Z7682 Awaiting organ transplant status: Secondary | ICD-10-CM | POA: Diagnosis not present

## 2021-06-06 ENCOUNTER — Ambulatory Visit: Payer: Medicare Other

## 2021-06-06 DIAGNOSIS — R2689 Other abnormalities of gait and mobility: Secondary | ICD-10-CM

## 2021-06-06 DIAGNOSIS — M25552 Pain in left hip: Secondary | ICD-10-CM

## 2021-06-06 DIAGNOSIS — M6281 Muscle weakness (generalized): Secondary | ICD-10-CM

## 2021-06-06 DIAGNOSIS — M25611 Stiffness of right shoulder, not elsewhere classified: Secondary | ICD-10-CM | POA: Diagnosis not present

## 2021-06-06 DIAGNOSIS — G8929 Other chronic pain: Secondary | ICD-10-CM | POA: Diagnosis not present

## 2021-06-06 DIAGNOSIS — M25561 Pain in right knee: Secondary | ICD-10-CM | POA: Diagnosis not present

## 2021-06-06 DIAGNOSIS — M25562 Pain in left knee: Secondary | ICD-10-CM | POA: Diagnosis not present

## 2021-06-06 DIAGNOSIS — M25652 Stiffness of left hip, not elsewhere classified: Secondary | ICD-10-CM | POA: Diagnosis not present

## 2021-06-06 NOTE — Therapy (Signed)
? ?OUTPATIENT PHYSICAL THERAPY TREATMENT NOTE ? ? ?Patient Name: Carl Congrove Sr. ?MRN: [id number] ?DOB:07-22-1960, 61 y.o., male ?Today's Date: 06/06/2021 ? ?PCP: [name], MD ?REFERRING PROVIDER: [name], MD ? ? PT End of Session - 06/06/21 1012   ? ? Visit Number 27   ? Date for PT Re-Evaluation 07/07/21   ? Authorization Type UHC Medicare   ? Progress Note Due on Visit 30   ? PT Start Time [phone]   late  ? PT Stop Time 1015   ? PT Time Calculation (min) 33 min   ? Activity Tolerance Patient tolerated treatment well   ? Behavior During Therapy [facility] for tasks assessed/performed   ? ?  ?  ? ?  ? ? ? ? ? ?Past Medical History:  ?Diagnosis Date  ? Acute gastric ulcer with hemorrhage   ? Acute GI bleeding 07/31/2019  ? Acute pericarditis   ? Anemia   ? hx of  ? Anxiety   ? situational   ? Arthritis   ? on meds  ? Borderline diabetes   ? Cataract   ? bilateral sx  ? Chronic headaches   ? Depression   ? situational   ? Diverticulitis   ? ESRD (end stage renal disease) ([place])   ?  On Renal Transplant List," [facility]; MWF" (10/23/2016)  ? GERD (gastroesophageal reflux disease)   ? with certain foods  ? GI bleed   ? Hypertension   ? diet controlled  ? Parathyroid abnormality ([place])   ? ectopic parathyroid gland  ? Presence of arteriovenous fistula for hemodialysis, primary ([place])   ? RUE  ? Refusal of blood product   ? NO WHOLE BLOOD PROUCTS  ? Renal cell carcinoma ([place])   ? s/p hand assisted laparoscopic bilateral nephrectomies 11/29/17, + RCC left  ? Secondary hyperparathyroidism ([place])   ? Seizures ([place])   ? one episode in past, due to" elevated Potassium" 08/02/20- "at least 4 years ago"  ? Sleep apnea   ? doesn't use CPAP anymore since weight loss  ? Stroke [facility])   ? no residual  ? ?Past Surgical History:  ?Procedure Laterality Date  ? AV FISTULA PLACEMENT Right   ? right arm  ? BIOPSY  08/01/2019  ? Procedure: BIOPSY;  Surgeon: Juanita Craver, MD;  Location: [facility] ENDOSCOPY;  Service: Endoscopy;;  ? BIOPSY  11/18/2020   ? Procedure: BIOPSY;  Surgeon: Daryel November, MD;  Location: [facility] ENDOSCOPY;  Service: Gastroenterology;;  ? CATARACT EXTRACTION W/ INTRAOCULAR LENS  IMPLANT, BILATERAL    ? COLON SURGERY    ? COLONOSCOPY N/A 08/04/2015  ? Procedure: COLONOSCOPY;  Surgeon: Jerene Bears, MD;  Location: [facility] ENDOSCOPY;  Service: Endoscopy;  Laterality: N/A;  ? COLONOSCOPY  2017  ? JMP@ [facility]-good prep-mass -recall 1 yr  ? COLONOSCOPY WITH PROPOFOL N/A 11/25/2020  ? Procedure: COLONOSCOPY WITH PROPOFOL;  Surgeon: Sharyn Creamer, MD;  Location: [place];  Service: Gastroenterology;  Laterality: N/A;  ? ESOPHAGOGASTRODUODENOSCOPY N/A 08/01/2019  ? Procedure: ESOPHAGOGASTRODUODENOSCOPY (EGD);  Surgeon: Juanita Craver, MD;  Location: [facility] ENDOSCOPY;  Service: Endoscopy;  Laterality: N/A;  ? ESOPHAGOGASTRODUODENOSCOPY N/A 11/18/2020  ? Procedure: ESOPHAGOGASTRODUODENOSCOPY (EGD);  Surgeon: Daryel November, MD;  Location: [place];  Service: Gastroenterology;  Laterality: N/A;  ? ESOPHAGOGASTRODUODENOSCOPY (EGD) WITH PROPOFOL N/A 08/04/2019  ? Procedure: ESOPHAGOGASTRODUODENOSCOPY (EGD) WITH PROPOFOL;  Surgeon: Yetta Flock, MD;  Location: [place];  Service: Gastroenterology;  Laterality: N/A;  ? graft left arm Left   ?  for dialysis x 2. Removed  ? HOT HEMOSTASIS  11/18/2020  ? Procedure: HOT HEMOSTASIS (ARGON PLASMA COAGULATION/BICAP);  Surgeon: Daryel November, MD;  Location: [place];  Service: Gastroenterology;;  ? INSERTION OF DIALYSIS CATHETER    ? Rt chest  ? LAPAROSCOPIC RIGHT COLECTOMY N/A 08/05/2015  ? Procedure: LAPAROSCOPIC RIGHT COLECTOMY- ASCENDING;  Surgeon: Stark Klein, MD;  Location: [place];  Service: General;  Laterality: N/A;  ? MASS EXCISION Left 05/28/2019  ? Procedure: EXCISION SOFT TISSUE MASS LEFT SHOULDER;  Surgeon: Armandina Gemma, MD;  [facility]: [facility];  Service: General;  Laterality: Left;  ? NEPHRECTOMY Bilateral   ? PARATHYROIDECTOMY N/A 06/12/2016  ? Procedure: TOTAL PARATHYROIDECTOMY  WITH AUTOTRANSPLANTATION TO LEFT FOREARM;  Surgeon: Armandina Gemma, MD;  Location: [place];  Service: General;  Laterality: N/A;  ? PARATHYROIDECTOMY N/A 10/23/2016  ? Procedure: PARATHYROIDECTOMY;  Surgeon: Armandina Gemma, MD;  Location: [place];  Service: General;  Laterality: N/A;  ? REVERSE SHOULDER ARTHROPLASTY Right 08/24/2020  ? Procedure: REVERSE SHOULDER ARTHROPLASTY;  Surgeon: Hiram Gash, MD;  Location: [place];  Service: Orthopedics;  Laterality: Right;  ? REVISON OF ARTERIOVENOUS FISTULA Right 07/16/2017  ? Procedure: REVISION OF ARTERIOVENOUS FISTULA  Right ARM;  Surgeon: Waynetta Sandy, MD;  Location: [place];  Service: Vascular;  Laterality: Right;  ? TOTAL HIP ARTHROPLASTY Left 11/14/2020  ? Procedure: LEFT TOTAL HIP ARTHROPLASTY ANTERIOR APPROACH;  Surgeon: Leandrew Koyanagi, MD;  Location: [place];  Service: Orthopedics;  Laterality: Left;  ? UPPER GASTROINTESTINAL ENDOSCOPY  2021  ? @ [facility]  ? ?Patient Active Problem List  ? Diagnosis Date Noted  ? Mucosal abnormality of esophagus   ? Mucosal abnormality of stomach   ? Mucosal abnormality of duodenum   ? Upper GI bleed   ? Left hip pain 11/16/2020  ? Type 2 diabetes mellitus with other specified complication ([place]) 46/96/2952  ? 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/14/2020  ? Primary osteoarthritis of left hip 10/07/2020  ? Status post reverse total arthroplasty of right shoulder 08/24/2020  ? Diverticulitis 03/08/2020  ? ESRD on hemodialysis ([place]) 03/08/2020  ? Polyneuropathy in diseases classified elsewhere ([place]) 03/08/2020  ? Pure hypercholesterolemia 03/08/2020  ? Sacral back pain 03/08/2020  ? 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 Acute gastric ulcer with hemorrhage   ? Hypotension of hemodialysis 07/31/2019  ? Diverticulitis large intestine 07/31/2019  ? Mass of soft tissue of shoulder 05/26/2019  ? Awaiting organ transplant status 03/18/2019  ? Finding of cocaine in blood 02/25/2019  ? Anaphylactic shock, unspecified, sequela  11/21/2018  ? Hypercalcemia 10/28/2018  ? Diarrhea, unspecified 08/08/2018  ? Hyperkalemia 03/12/2018  ? Patient is Jehovah's Witness 01/30/2018  ? Renal cell carcinoma of left kidney ([place]) 12/24/2017  ? Fluid overload, unspecified 07/06/2017  ? 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 Other specified disorders of kidney and ureter 11/22/2016  ? Secondary hyperparathyroidism of renal origin ([place]) 10/23/2016  ? Other headache syndrome 08/21/2016  ? TIA (transient ischemic attack)   ? Essential hypertension   ? Seizures ([place])   ? 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 Lung nodule   ? Diverticulitis of cecum 07/21/2015  ? Diverticulitis of large intestine without perforation or abscess without bleeding   ? Right lower quadrant pain   ? Obstructive sleep apnea 02/08/2015  ? 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 Diverticulosis 07/13/2014  ? Lower GI bleed 07/12/2014  ? Refusal of blood transfusions as patient  is Jehovah's Witness   ? Parotid mass   ? 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 HLD (hyperlipidemia)   ? PRES (posterior reversible encephalopathy syndrome)   ? History of lacunar cerebrovascular accident (CVA) 05/23/2014  ? Anemia of renal disease 05/23/2014  ? GERD (gastroesophageal reflux disease) 12/09/2012  ? Gastrointestinal bleeding 11/12/2012  ? Melena 11/12/2012  ? Other psychoactive substance dependence, uncomplicated ([place]) 66/44/0347  ? Iron deficiency anemia, unspecified 06/10/2012  ? Moderate protein-calorie malnutrition ([place]) 07/29/2009  ? Coagulation defect, unspecified ([place]) 08/29/2006  ? Type 2 diabetes mellitus with diabetic peripheral angiopathy without gangrene ([place]) 08/29/2006  ? 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 stage renal disease ([place]) 08/02/2006  ? ? ?REFERRING DIAG: M25.561 (ICD-10-CM) - Pain in right knee  R29.898 (ICD-10-CM) - Other symptoms and signs involving the  musculoskeletal system  ?Onset date: Lt hip replacement 09/2020 ?  ?THERAPY DIAG:  ?Muscle weakness (generalized) ?  ?Other abnormalities of gait and mobility ?  ?Stiffness of right shoulder, not elsewhere cla

## 2021-06-07 DIAGNOSIS — D631 Anemia in chronic kidney disease: Secondary | ICD-10-CM | POA: Diagnosis not present

## 2021-06-07 DIAGNOSIS — Z7682 Awaiting organ transplant status: Secondary | ICD-10-CM | POA: Diagnosis not present

## 2021-06-07 DIAGNOSIS — N186 End stage renal disease: Secondary | ICD-10-CM | POA: Diagnosis not present

## 2021-06-07 DIAGNOSIS — R519 Headache, unspecified: Secondary | ICD-10-CM | POA: Diagnosis not present

## 2021-06-07 DIAGNOSIS — Z992 Dependence on renal dialysis: Secondary | ICD-10-CM | POA: Diagnosis not present

## 2021-06-07 DIAGNOSIS — R197 Diarrhea, unspecified: Secondary | ICD-10-CM | POA: Diagnosis not present

## 2021-06-07 DIAGNOSIS — D509 Iron deficiency anemia, unspecified: Secondary | ICD-10-CM | POA: Diagnosis not present

## 2021-06-07 DIAGNOSIS — N2581 Secondary hyperparathyroidism of renal origin: Secondary | ICD-10-CM | POA: Diagnosis not present

## 2021-06-07 DIAGNOSIS — D689 Coagulation defect, unspecified: Secondary | ICD-10-CM | POA: Diagnosis not present

## 2021-06-08 ENCOUNTER — Ambulatory Visit: Payer: Medicare Other | Admitting: Rehabilitative and Restorative Service Providers"

## 2021-06-08 ENCOUNTER — Encounter: Payer: Self-pay | Admitting: Rehabilitative and Restorative Service Providers"

## 2021-06-08 DIAGNOSIS — R2689 Other abnormalities of gait and mobility: Secondary | ICD-10-CM | POA: Diagnosis not present

## 2021-06-08 DIAGNOSIS — M25562 Pain in left knee: Secondary | ICD-10-CM | POA: Diagnosis not present

## 2021-06-08 DIAGNOSIS — M25652 Stiffness of left hip, not elsewhere classified: Secondary | ICD-10-CM | POA: Diagnosis not present

## 2021-06-08 DIAGNOSIS — M6281 Muscle weakness (generalized): Secondary | ICD-10-CM | POA: Diagnosis not present

## 2021-06-08 DIAGNOSIS — G8929 Other chronic pain: Secondary | ICD-10-CM

## 2021-06-08 DIAGNOSIS — M25561 Pain in right knee: Secondary | ICD-10-CM | POA: Diagnosis not present

## 2021-06-08 DIAGNOSIS — M25552 Pain in left hip: Secondary | ICD-10-CM | POA: Diagnosis not present

## 2021-06-08 DIAGNOSIS — M25611 Stiffness of right shoulder, not elsewhere classified: Secondary | ICD-10-CM | POA: Diagnosis not present

## 2021-06-08 NOTE — Therapy (Signed)
? ?OUTPATIENT PHYSICAL THERAPY TREATMENT NOTE ? ? ?Patient Name: Carl Geske Sr. ?MRN: [id number] ?DOB:12-Oct-1960, 61 y.o., male ?Today's Date: 06/08/2021 ? ?PCP: [name], MD ?REFERRING PROVIDER: [name], MD ? ? PT End of Session - 06/08/21 0735   ? ? Visit Number 28   ? Date for PT Re-Evaluation 07/07/21   ? Authorization Type UHC Medicare   ? Progress Note Due on Visit 30   ? PT Start Time 0730   ? PT Stop Time 0800   ? PT Time Calculation (min) 30 min   ? Activity Tolerance Patient tolerated treatment well   ? Behavior During Therapy [facility] for tasks assessed/performed   ? ?  ?  ? ?  ? ? ? ? ? ?Past Medical History:  ?Diagnosis Date  ? Acute gastric ulcer with hemorrhage   ? Acute GI bleeding 07/31/2019  ? Acute pericarditis   ? Anemia   ? hx of  ? Anxiety   ? situational   ? Arthritis   ? on meds  ? Borderline diabetes   ? Cataract   ? bilateral sx  ? Chronic headaches   ? Depression   ? situational   ? Diverticulitis   ? ESRD (end stage renal disease) ([place])   ?  On Renal Transplant List," [facility]; MWF" (10/23/2016)  ? GERD (gastroesophageal reflux disease)   ? with certain foods  ? GI bleed   ? Hypertension   ? diet controlled  ? Parathyroid abnormality ([place])   ? ectopic parathyroid gland  ? Presence of arteriovenous fistula for hemodialysis, primary ([place])   ? RUE  ? Refusal of blood product   ? NO WHOLE BLOOD PROUCTS  ? Renal cell carcinoma ([place])   ? s/p hand assisted laparoscopic bilateral nephrectomies 11/29/17, + RCC left  ? Secondary hyperparathyroidism ([place])   ? Seizures ([place])   ? one episode in past, due to" elevated Potassium" 08/02/20- "at least 4 years ago"  ? Sleep apnea   ? doesn't use CPAP anymore since weight loss  ? Stroke [facility])   ? no residual  ? ?Past Surgical History:  ?Procedure Laterality Date  ? AV FISTULA PLACEMENT Right   ? right arm  ? BIOPSY  08/01/2019  ? Procedure: BIOPSY;  Surgeon: Juanita Craver, MD;  Location: [facility] ENDOSCOPY;  Service: Endoscopy;;  ? BIOPSY  11/18/2020  ?  Procedure: BIOPSY;  Surgeon: Daryel November, MD;  Location: [facility] ENDOSCOPY;  Service: Gastroenterology;;  ? CATARACT EXTRACTION W/ INTRAOCULAR LENS  IMPLANT, BILATERAL    ? COLON SURGERY    ? COLONOSCOPY N/A 08/04/2015  ? Procedure: COLONOSCOPY;  Surgeon: Jerene Bears, MD;  Location: [facility] ENDOSCOPY;  Service: Endoscopy;  Laterality: N/A;  ? COLONOSCOPY  2017  ? JMP@ [facility]-good prep-mass -recall 1 yr  ? COLONOSCOPY WITH PROPOFOL N/A 11/25/2020  ? Procedure: COLONOSCOPY WITH PROPOFOL;  Surgeon: Sharyn Creamer, MD;  Location: [place];  Service: Gastroenterology;  Laterality: N/A;  ? ESOPHAGOGASTRODUODENOSCOPY N/A 08/01/2019  ? Procedure: ESOPHAGOGASTRODUODENOSCOPY (EGD);  Surgeon: Juanita Craver, MD;  Location: [facility] ENDOSCOPY;  Service: Endoscopy;  Laterality: N/A;  ? ESOPHAGOGASTRODUODENOSCOPY N/A 11/18/2020  ? Procedure: ESOPHAGOGASTRODUODENOSCOPY (EGD);  Surgeon: Daryel November, MD;  Location: [place];  Service: Gastroenterology;  Laterality: N/A;  ? ESOPHAGOGASTRODUODENOSCOPY (EGD) WITH PROPOFOL N/A 08/04/2019  ? Procedure: ESOPHAGOGASTRODUODENOSCOPY (EGD) WITH PROPOFOL;  Surgeon: Yetta Flock, MD;  Location: [place];  Service: Gastroenterology;  Laterality: N/A;  ? graft left arm Left   ? for  dialysis x 2. Removed  ? HOT HEMOSTASIS  11/18/2020  ? Procedure: HOT HEMOSTASIS (ARGON PLASMA COAGULATION/BICAP);  Surgeon: Daryel November, MD;  Location: [place];  Service: Gastroenterology;;  ? INSERTION OF DIALYSIS CATHETER    ? Rt chest  ? LAPAROSCOPIC RIGHT COLECTOMY N/A 08/05/2015  ? Procedure: LAPAROSCOPIC RIGHT COLECTOMY- ASCENDING;  Surgeon: Stark Klein, MD;  Location: [place];  Service: General;  Laterality: N/A;  ? MASS EXCISION Left 05/28/2019  ? Procedure: EXCISION SOFT TISSUE MASS LEFT SHOULDER;  Surgeon: Armandina Gemma, MD;  [facility];  Service: General;  Laterality: Left;  ? NEPHRECTOMY Bilateral   ? PARATHYROIDECTOMY N/A 06/12/2016  ? Procedure: TOTAL PARATHYROIDECTOMY  WITH AUTOTRANSPLANTATION TO LEFT FOREARM;  Surgeon: Armandina Gemma, MD;  Location: [place];  Service: General;  Laterality: N/A;  ? PARATHYROIDECTOMY N/A 10/23/2016  ? Procedure: PARATHYROIDECTOMY;  Surgeon: Armandina Gemma, MD;  Location: [place];  Service: General;  Laterality: N/A;  ? REVERSE SHOULDER ARTHROPLASTY Right 08/24/2020  ? Procedure: REVERSE SHOULDER ARTHROPLASTY;  Surgeon: Hiram Gash, MD;  Location: [place];  Service: Orthopedics;  Laterality: Right;  ? REVISON OF ARTERIOVENOUS FISTULA Right 07/16/2017  ? Procedure: REVISION OF ARTERIOVENOUS FISTULA  Right ARM;  Surgeon: Waynetta Sandy, MD;  Location: [place];  Service: Vascular;  Laterality: Right;  ? TOTAL HIP ARTHROPLASTY Left 11/14/2020  ? Procedure: LEFT TOTAL HIP ARTHROPLASTY ANTERIOR APPROACH;  Surgeon: Leandrew Koyanagi, MD;  Location: [place];  Service: Orthopedics;  Laterality: Left;  ? UPPER GASTROINTESTINAL ENDOSCOPY  2021  ? @ [facility]  ? ?Patient Active Problem List  ? Diagnosis Date Noted  ? Mucosal abnormality of esophagus   ? Mucosal abnormality of stomach   ? Mucosal abnormality of duodenum   ? Upper GI bleed   ? Left hip pain 11/16/2020  ? Type 2 diabetes mellitus with other specified complication ([place]) 01/74/9449  ? 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/14/2020  ? Primary osteoarthritis of left hip 10/07/2020  ? Status post reverse total arthroplasty of right shoulder 08/24/2020  ? Diverticulitis 03/08/2020  ? ESRD on hemodialysis ([place]) 03/08/2020  ? Polyneuropathy in diseases classified elsewhere ([place]) 03/08/2020  ? Pure hypercholesterolemia 03/08/2020  ? Sacral back pain 03/08/2020  ? 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 Acute gastric ulcer with hemorrhage   ? Hypotension of hemodialysis 07/31/2019  ? Diverticulitis large intestine 07/31/2019  ? Mass of soft tissue of shoulder 05/26/2019  ? Awaiting organ transplant status 03/18/2019  ? Finding of cocaine in blood 02/25/2019  ? Anaphylactic shock, unspecified, sequela  11/21/2018  ? Hypercalcemia 10/28/2018  ? Diarrhea, unspecified 08/08/2018  ? Hyperkalemia 03/12/2018  ? Patient is Jehovah's Witness 01/30/2018  ? Renal cell carcinoma of left kidney ([place]) 12/24/2017  ? Fluid overload, unspecified 07/06/2017  ? 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 Other specified disorders of kidney and ureter 11/22/2016  ? Secondary hyperparathyroidism of renal origin ([place]) 10/23/2016  ? Other headache syndrome 08/21/2016  ? TIA (transient ischemic attack)   ? Essential hypertension   ? Seizures ([place])   ? 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 Lung nodule   ? Diverticulitis of cecum 07/21/2015  ? Diverticulitis of large intestine without perforation or abscess without bleeding   ? Right lower quadrant pain   ? Obstructive sleep apnea 02/08/2015  ? 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 Diverticulosis 07/13/2014  ? Lower GI bleed 07/12/2014  ? Refusal of blood transfusions as patient is  Jehovah's Witness   ? Parotid mass   ? 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 HLD (hyperlipidemia)   ? PRES (posterior reversible encephalopathy syndrome)   ? History of lacunar cerebrovascular accident (CVA) 05/23/2014  ? Anemia of renal disease 05/23/2014  ? GERD (gastroesophageal reflux disease) 12/09/2012  ? Gastrointestinal bleeding 11/12/2012  ? Melena 11/12/2012  ? Other psychoactive substance dependence, uncomplicated ([place]) 71/07/2692  ? Iron deficiency anemia, unspecified 06/10/2012  ? Moderate protein-calorie malnutrition ([place]) 07/29/2009  ? Coagulation defect, unspecified ([place]) 08/29/2006  ? Type 2 diabetes mellitus with diabetic peripheral angiopathy without gangrene ([place]) 08/29/2006  ? 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 stage renal disease ([name]) 08/02/2006  ? ? ?REFERRING DIAG: M25.561 (ICD-10-CM) - Pain in right knee  R29.898 (ICD-10-CM) - Other symptoms and signs involving the  musculoskeletal system  ?Onset date: Lt hip replacement 09/2020 ?  ?THERAPY DIAG:  ?Muscle weakness (generalized) ?  ?Other abnormalities of gait and mobility ?  ?Stiffness of right shoulder, not elsewhere classifie

## 2021-06-09 DIAGNOSIS — D631 Anemia in chronic kidney disease: Secondary | ICD-10-CM | POA: Diagnosis not present

## 2021-06-09 DIAGNOSIS — D689 Coagulation defect, unspecified: Secondary | ICD-10-CM | POA: Diagnosis not present

## 2021-06-09 DIAGNOSIS — D509 Iron deficiency anemia, unspecified: Secondary | ICD-10-CM | POA: Diagnosis not present

## 2021-06-09 DIAGNOSIS — R197 Diarrhea, unspecified: Secondary | ICD-10-CM | POA: Diagnosis not present

## 2021-06-09 DIAGNOSIS — Z992 Dependence on renal dialysis: Secondary | ICD-10-CM | POA: Diagnosis not present

## 2021-06-09 DIAGNOSIS — N186 End stage renal disease: Secondary | ICD-10-CM | POA: Diagnosis not present

## 2021-06-09 DIAGNOSIS — R519 Headache, unspecified: Secondary | ICD-10-CM | POA: Diagnosis not present

## 2021-06-09 DIAGNOSIS — Z7682 Awaiting organ transplant status: Secondary | ICD-10-CM | POA: Diagnosis not present

## 2021-06-09 DIAGNOSIS — N2581 Secondary hyperparathyroidism of renal origin: Secondary | ICD-10-CM | POA: Diagnosis not present

## 2021-06-11 DIAGNOSIS — N186 End stage renal disease: Secondary | ICD-10-CM | POA: Diagnosis not present

## 2021-06-11 DIAGNOSIS — I12 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 stage renal disease: Secondary | ICD-10-CM | POA: Diagnosis not present

## 2021-06-11 DIAGNOSIS — Z992 Dependence on renal dialysis: Secondary | ICD-10-CM | POA: Diagnosis not present

## 2021-06-12 DIAGNOSIS — D509 Iron deficiency anemia, unspecified: Secondary | ICD-10-CM | POA: Diagnosis not present

## 2021-06-12 DIAGNOSIS — N186 End stage renal disease: Secondary | ICD-10-CM | POA: Diagnosis not present

## 2021-06-12 DIAGNOSIS — N2581 Secondary hyperparathyroidism of renal origin: Secondary | ICD-10-CM | POA: Diagnosis not present

## 2021-06-12 DIAGNOSIS — Z992 Dependence on renal dialysis: Secondary | ICD-10-CM | POA: Diagnosis not present

## 2021-06-12 DIAGNOSIS — D631 Anemia in chronic kidney disease: Secondary | ICD-10-CM | POA: Diagnosis not present

## 2021-06-12 DIAGNOSIS — D689 Coagulation defect, unspecified: Secondary | ICD-10-CM | POA: Diagnosis not present

## 2021-06-12 DIAGNOSIS — Z7682 Awaiting organ transplant status: Secondary | ICD-10-CM | POA: Diagnosis not present

## 2021-06-14 DIAGNOSIS — N2581 Secondary hyperparathyroidism of renal origin: Secondary | ICD-10-CM | POA: Diagnosis not present

## 2021-06-14 DIAGNOSIS — N186 End stage renal disease: Secondary | ICD-10-CM | POA: Diagnosis not present

## 2021-06-14 DIAGNOSIS — Z7682 Awaiting organ transplant status: Secondary | ICD-10-CM | POA: Diagnosis not present

## 2021-06-14 DIAGNOSIS — D509 Iron deficiency anemia, unspecified: Secondary | ICD-10-CM | POA: Diagnosis not present

## 2021-06-14 DIAGNOSIS — Z992 Dependence on renal dialysis: Secondary | ICD-10-CM | POA: Diagnosis not present

## 2021-06-14 DIAGNOSIS — D689 Coagulation defect, unspecified: Secondary | ICD-10-CM | POA: Diagnosis not present

## 2021-06-14 DIAGNOSIS — D631 Anemia in chronic kidney disease: Secondary | ICD-10-CM | POA: Diagnosis not present

## 2021-06-15 DIAGNOSIS — Z992 Dependence on renal dialysis: Secondary | ICD-10-CM | POA: Diagnosis not present

## 2021-06-15 DIAGNOSIS — D689 Coagulation defect, unspecified: Secondary | ICD-10-CM | POA: Diagnosis not present

## 2021-06-15 DIAGNOSIS — Z7682 Awaiting organ transplant status: Secondary | ICD-10-CM | POA: Diagnosis not present

## 2021-06-15 DIAGNOSIS — D631 Anemia in chronic kidney disease: Secondary | ICD-10-CM | POA: Diagnosis not present

## 2021-06-15 DIAGNOSIS — N186 End stage renal disease: Secondary | ICD-10-CM | POA: Diagnosis not present

## 2021-06-15 DIAGNOSIS — N2581 Secondary hyperparathyroidism of renal origin: Secondary | ICD-10-CM | POA: Diagnosis not present

## 2021-06-15 DIAGNOSIS — D509 Iron deficiency anemia, unspecified: Secondary | ICD-10-CM | POA: Diagnosis not present

## 2021-06-16 DIAGNOSIS — D509 Iron deficiency anemia, unspecified: Secondary | ICD-10-CM | POA: Diagnosis not present

## 2021-06-16 DIAGNOSIS — Z7682 Awaiting organ transplant status: Secondary | ICD-10-CM | POA: Diagnosis not present

## 2021-06-16 DIAGNOSIS — N2581 Secondary hyperparathyroidism of renal origin: Secondary | ICD-10-CM | POA: Diagnosis not present

## 2021-06-16 DIAGNOSIS — N186 End stage renal disease: Secondary | ICD-10-CM | POA: Diagnosis not present

## 2021-06-16 DIAGNOSIS — D689 Coagulation defect, unspecified: Secondary | ICD-10-CM | POA: Diagnosis not present

## 2021-06-16 DIAGNOSIS — D631 Anemia in chronic kidney disease: Secondary | ICD-10-CM | POA: Diagnosis not present

## 2021-06-16 DIAGNOSIS — Z992 Dependence on renal dialysis: Secondary | ICD-10-CM | POA: Diagnosis not present

## 2021-06-19 ENCOUNTER — Ambulatory Visit: Payer: Medicare Other | Attending: Family Medicine

## 2021-06-19 DIAGNOSIS — R2689 Other abnormalities of gait and mobility: Secondary | ICD-10-CM | POA: Diagnosis not present

## 2021-06-19 DIAGNOSIS — N186 End stage renal disease: Secondary | ICD-10-CM | POA: Diagnosis not present

## 2021-06-19 DIAGNOSIS — D509 Iron deficiency anemia, unspecified: Secondary | ICD-10-CM | POA: Diagnosis not present

## 2021-06-19 DIAGNOSIS — G8929 Other chronic pain: Secondary | ICD-10-CM | POA: Insufficient documentation

## 2021-06-19 DIAGNOSIS — M6281 Muscle weakness (generalized): Secondary | ICD-10-CM | POA: Diagnosis not present

## 2021-06-19 DIAGNOSIS — M25561 Pain in right knee: Secondary | ICD-10-CM | POA: Diagnosis not present

## 2021-06-19 DIAGNOSIS — M25611 Stiffness of right shoulder, not elsewhere classified: Secondary | ICD-10-CM | POA: Diagnosis not present

## 2021-06-19 DIAGNOSIS — M25562 Pain in left knee: Secondary | ICD-10-CM | POA: Insufficient documentation

## 2021-06-19 DIAGNOSIS — M25652 Stiffness of left hip, not elsewhere classified: Secondary | ICD-10-CM | POA: Diagnosis not present

## 2021-06-19 DIAGNOSIS — Z7682 Awaiting organ transplant status: Secondary | ICD-10-CM | POA: Diagnosis not present

## 2021-06-19 DIAGNOSIS — D631 Anemia in chronic kidney disease: Secondary | ICD-10-CM | POA: Diagnosis not present

## 2021-06-19 DIAGNOSIS — N2581 Secondary hyperparathyroidism of renal origin: Secondary | ICD-10-CM | POA: Diagnosis not present

## 2021-06-19 DIAGNOSIS — M25552 Pain in left hip: Secondary | ICD-10-CM | POA: Diagnosis not present

## 2021-06-19 DIAGNOSIS — D689 Coagulation defect, unspecified: Secondary | ICD-10-CM | POA: Diagnosis not present

## 2021-06-19 DIAGNOSIS — Z992 Dependence on renal dialysis: Secondary | ICD-10-CM | POA: Diagnosis not present

## 2021-06-19 NOTE — Therapy (Signed)
? ?OUTPATIENT PHYSICAL THERAPY TREATMENT NOTE ? ? ?Patient Name: Carl Loiseau Sr. ?MRN: [id number] ?DOB:05-Jun-1960, 61 y.o., male ?Today's Date: 06/19/2021 ? ?PCP: [name], MD ?REFERRING PROVIDER: [name], MD ? ? PT End of Session - 06/19/21 1014   ? ? Visit Number 29   ? Date for PT Re-Evaluation 07/07/21   ? Authorization Type UHC Medicare   ? Progress Note Due on Visit 30   ? PT Start Time 0945   15 min late  ? PT Stop Time 1016   ? PT Time Calculation (min) 31 min   ? Activity Tolerance Patient tolerated treatment well   ? Behavior During Therapy [facility] for tasks assessed/performed   ? ?  ?  ? ?  ? ? ? ? ? ? ?Past Medical History:  ?Diagnosis Date  ? Acute gastric ulcer with hemorrhage   ? Acute GI bleeding 07/31/2019  ? Acute pericarditis   ? Anemia   ? hx of  ? Anxiety   ? situational   ? Arthritis   ? on meds  ? Borderline diabetes   ? Cataract   ? bilateral sx  ? Chronic headaches   ? Depression   ? situational   ? Diverticulitis   ? ESRD (end stage renal disease) ([place])   ?  On Renal Transplant List," [facility]; MWF" (10/23/2016)  ? GERD (gastroesophageal reflux disease)   ? with certain foods  ? GI bleed   ? Hypertension   ? diet controlled  ? Parathyroid abnormality ([place])   ? ectopic parathyroid gland  ? Presence of arteriovenous fistula for hemodialysis, primary ([place])   ? RUE  ? Refusal of blood product   ? NO WHOLE BLOOD PROUCTS  ? Renal cell carcinoma ([place])   ? s/p hand assisted laparoscopic bilateral nephrectomies 11/29/17, + RCC left  ? Secondary hyperparathyroidism ([place])   ? Seizures ([place])   ? one episode in past, due to" elevated Potassium" 08/02/20- "at least 4 years ago"  ? Sleep apnea   ? doesn't use CPAP anymore since weight loss  ? Stroke [facility])   ? no residual  ? ?Past Surgical History:  ?Procedure Laterality Date  ? AV FISTULA PLACEMENT Right   ? right arm  ? BIOPSY  08/01/2019  ? Procedure: BIOPSY;  Surgeon: Juanita Craver, MD;  Location: [facility] ENDOSCOPY;  Service: Endoscopy;;  ? BIOPSY   11/18/2020  ? Procedure: BIOPSY;  Surgeon: Daryel November, MD;  Location: [facility] ENDOSCOPY;  Service: Gastroenterology;;  ? CATARACT EXTRACTION W/ INTRAOCULAR LENS  IMPLANT, BILATERAL    ? COLON SURGERY    ? COLONOSCOPY N/A 08/04/2015  ? Procedure: COLONOSCOPY;  Surgeon: Jerene Bears, MD;  Location: [facility] ENDOSCOPY;  Service: Endoscopy;  Laterality: N/A;  ? COLONOSCOPY  2017  ? JMP@ [facility]-good prep-mass -recall 1 yr  ? COLONOSCOPY WITH PROPOFOL N/A 11/25/2020  ? Procedure: COLONOSCOPY WITH PROPOFOL;  Surgeon: Sharyn Creamer, MD;  Location: [place];  Service: Gastroenterology;  Laterality: N/A;  ? ESOPHAGOGASTRODUODENOSCOPY N/A 08/01/2019  ? Procedure: ESOPHAGOGASTRODUODENOSCOPY (EGD);  Surgeon: Juanita Craver, MD;  Location: [facility] ENDOSCOPY;  Service: Endoscopy;  Laterality: N/A;  ? ESOPHAGOGASTRODUODENOSCOPY N/A 11/18/2020  ? Procedure: ESOPHAGOGASTRODUODENOSCOPY (EGD);  Surgeon: Daryel November, MD;  Location: [place];  Service: Gastroenterology;  Laterality: N/A;  ? ESOPHAGOGASTRODUODENOSCOPY (EGD) WITH PROPOFOL N/A 08/04/2019  ? Procedure: ESOPHAGOGASTRODUODENOSCOPY (EGD) WITH PROPOFOL;  Surgeon: Yetta Flock, MD;  Location: [place];  Service: Gastroenterology;  Laterality: N/A;  ? graft left arm  Left   ? for dialysis x 2. Removed  ? HOT HEMOSTASIS  11/18/2020  ? Procedure: HOT HEMOSTASIS (ARGON PLASMA COAGULATION/BICAP);  Surgeon: Daryel November, MD;  Location: [place];  Service: Gastroenterology;;  ? INSERTION OF DIALYSIS CATHETER    ? Rt chest  ? LAPAROSCOPIC RIGHT COLECTOMY N/A 08/05/2015  ? Procedure: LAPAROSCOPIC RIGHT COLECTOMY- ASCENDING;  Surgeon: Stark Klein, MD;  Location: [place];  Service: General;  Laterality: N/A;  ? MASS EXCISION Left 05/28/2019  ? Procedure: EXCISION SOFT TISSUE MASS LEFT SHOULDER;  Surgeon: Armandina Gemma, MD;  Location: [facility];  Service: General;  Laterality: Left;  ? NEPHRECTOMY Bilateral   ? PARATHYROIDECTOMY N/A 06/12/2016  ? Procedure: TOTAL  PARATHYROIDECTOMY WITH AUTOTRANSPLANTATION TO LEFT FOREARM;  Surgeon: Armandina Gemma, MD;  Location: [place];  Service: General;  Laterality: N/A;  ? PARATHYROIDECTOMY N/A 10/23/2016  ? Procedure: PARATHYROIDECTOMY;  Surgeon: Armandina Gemma, MD;  Location: [place];  Service: General;  Laterality: N/A;  ? REVERSE SHOULDER ARTHROPLASTY Right 08/24/2020  ? Procedure: REVERSE SHOULDER ARTHROPLASTY;  Surgeon: Hiram Gash, MD;  Location: [place];  Service: Orthopedics;  Laterality: Right;  ? REVISON OF ARTERIOVENOUS FISTULA Right 07/16/2017  ? Procedure: REVISION OF ARTERIOVENOUS FISTULA  Right ARM;  Surgeon: Waynetta Sandy, MD;  Location: [place];  Service: Vascular;  Laterality: Right;  ? TOTAL HIP ARTHROPLASTY Left 11/14/2020  ? Procedure: LEFT TOTAL HIP ARTHROPLASTY ANTERIOR APPROACH;  Surgeon: Leandrew Koyanagi, MD;  Location: [place];  Service: Orthopedics;  Laterality: Left;  ? UPPER GASTROINTESTINAL ENDOSCOPY  2021  ? @ [facility]  ? ?Patient Active Problem List  ? Diagnosis Date Noted  ? Mucosal abnormality of esophagus   ? Mucosal abnormality of stomach   ? Mucosal abnormality of duodenum   ? Upper GI bleed   ? Left hip pain 11/16/2020  ? Type 2 diabetes mellitus with other specified complication ([place]) 29/92/4268  ? 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/14/2020  ? Primary osteoarthritis of left hip 10/07/2020  ? Status post reverse total arthroplasty of right shoulder 08/24/2020  ? Diverticulitis 03/08/2020  ? ESRD on hemodialysis ([place]) 03/08/2020  ? Polyneuropathy in diseases classified elsewhere ([place]) 03/08/2020  ? Pure hypercholesterolemia 03/08/2020  ? Sacral back pain 03/08/2020  ? 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 Acute gastric ulcer with hemorrhage   ? Hypotension of hemodialysis 07/31/2019  ? Diverticulitis large intestine 07/31/2019  ? Mass of soft tissue of shoulder 05/26/2019  ? Awaiting organ transplant status 03/18/2019  ? Finding of cocaine in blood 02/25/2019  ? Anaphylactic shock,  unspecified, sequela 11/21/2018  ? Hypercalcemia 10/28/2018  ? Diarrhea, unspecified 08/08/2018  ? Hyperkalemia 03/12/2018  ? Patient is Jehovah's Witness 01/30/2018  ? Renal cell carcinoma of left kidney ([place]) 12/24/2017  ? Fluid overload, unspecified 07/06/2017  ? 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 Other specified disorders of kidney and ureter 11/22/2016  ? Secondary hyperparathyroidism of renal origin ([place]) 10/23/2016  ? Other headache syndrome 08/21/2016  ? TIA (transient ischemic attack)   ? Essential hypertension   ? Seizures ([place])   ? 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 Lung nodule   ? Diverticulitis of cecum 07/21/2015  ? Diverticulitis of large intestine without perforation or abscess without bleeding   ? Right lower quadrant pain   ? Obstructive sleep apnea 02/08/2015  ? 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 Diverticulosis 07/13/2014  ? Lower GI bleed 07/12/2014  ? Refusal of  blood transfusions as patient is Jehovah's Witness   ? Parotid mass   ? 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 HLD (hyperlipidemia)   ? PRES (posterior reversible encephalopathy syndrome)   ? History of lacunar cerebrovascular accident (CVA) 05/23/2014  ? Anemia of renal disease 05/23/2014  ? GERD (gastroesophageal reflux disease) 12/09/2012  ? Gastrointestinal bleeding 11/12/2012  ? Melena 11/12/2012  ? Other psychoactive substance dependence, uncomplicated ([place]) 12/06/8525  ? Iron deficiency anemia, unspecified 06/10/2012  ? Moderate protein-calorie malnutrition ([place]) 07/29/2009  ? Coagulation defect, unspecified ([place]) 08/29/2006  ? Type 2 diabetes mellitus with diabetic peripheral angiopathy without gangrene ([place]) 08/29/2006  ? 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 stage renal disease ([place]) 08/02/2006  ? ? ?REFERRING DIAG: M25.561 (ICD-10-CM) - Pain in right knee  R29.898 (ICD-10-CM) - Other symptoms and  signs involving the musculoskeletal system  ?Onset date: Lt hip replacement 09/2020 ?  ?THERAPY DIAG:  ?Muscle weakness (generalized) ?  ?Other abnormalities of gait and mobility ?  ?Stiffness of right shoulder, not elsew

## 2021-06-21 DIAGNOSIS — Z7682 Awaiting organ transplant status: Secondary | ICD-10-CM | POA: Diagnosis not present

## 2021-06-21 DIAGNOSIS — D509 Iron deficiency anemia, unspecified: Secondary | ICD-10-CM | POA: Diagnosis not present

## 2021-06-21 DIAGNOSIS — D689 Coagulation defect, unspecified: Secondary | ICD-10-CM | POA: Diagnosis not present

## 2021-06-21 DIAGNOSIS — N186 End stage renal disease: Secondary | ICD-10-CM | POA: Diagnosis not present

## 2021-06-21 DIAGNOSIS — D631 Anemia in chronic kidney disease: Secondary | ICD-10-CM | POA: Diagnosis not present

## 2021-06-21 DIAGNOSIS — Z992 Dependence on renal dialysis: Secondary | ICD-10-CM | POA: Diagnosis not present

## 2021-06-21 DIAGNOSIS — N2581 Secondary hyperparathyroidism of renal origin: Secondary | ICD-10-CM | POA: Diagnosis not present

## 2021-06-22 DIAGNOSIS — N186 End stage renal disease: Secondary | ICD-10-CM | POA: Diagnosis not present

## 2021-06-22 DIAGNOSIS — Z992 Dependence on renal dialysis: Secondary | ICD-10-CM | POA: Diagnosis not present

## 2021-06-22 DIAGNOSIS — N2581 Secondary hyperparathyroidism of renal origin: Secondary | ICD-10-CM | POA: Diagnosis not present

## 2021-06-23 ENCOUNTER — Encounter: Payer: Medicare Other | Admitting: Rehabilitative and Restorative Service Providers"

## 2021-06-23 DIAGNOSIS — N2581 Secondary hyperparathyroidism of renal origin: Secondary | ICD-10-CM | POA: Diagnosis not present

## 2021-06-23 DIAGNOSIS — N186 End stage renal disease: Secondary | ICD-10-CM | POA: Diagnosis not present

## 2021-06-23 DIAGNOSIS — D509 Iron deficiency anemia, unspecified: Secondary | ICD-10-CM | POA: Diagnosis not present

## 2021-06-23 DIAGNOSIS — Z7682 Awaiting organ transplant status: Secondary | ICD-10-CM | POA: Diagnosis not present

## 2021-06-23 DIAGNOSIS — Z992 Dependence on renal dialysis: Secondary | ICD-10-CM | POA: Diagnosis not present

## 2021-06-23 DIAGNOSIS — D631 Anemia in chronic kidney disease: Secondary | ICD-10-CM | POA: Diagnosis not present

## 2021-06-23 DIAGNOSIS — D689 Coagulation defect, unspecified: Secondary | ICD-10-CM | POA: Diagnosis not present

## 2021-06-26 ENCOUNTER — Ambulatory Visit: Payer: Medicare Other | Admitting: Rehabilitative and Restorative Service Providers"

## 2021-06-26 ENCOUNTER — Encounter: Payer: Self-pay | Admitting: Rehabilitative and Restorative Service Providers"

## 2021-06-26 DIAGNOSIS — D509 Iron deficiency anemia, unspecified: Secondary | ICD-10-CM | POA: Diagnosis not present

## 2021-06-26 DIAGNOSIS — M25552 Pain in left hip: Secondary | ICD-10-CM | POA: Diagnosis not present

## 2021-06-26 DIAGNOSIS — R2689 Other abnormalities of gait and mobility: Secondary | ICD-10-CM

## 2021-06-26 DIAGNOSIS — M25561 Pain in right knee: Secondary | ICD-10-CM | POA: Diagnosis not present

## 2021-06-26 DIAGNOSIS — M25562 Pain in left knee: Secondary | ICD-10-CM | POA: Diagnosis not present

## 2021-06-26 DIAGNOSIS — G8929 Other chronic pain: Secondary | ICD-10-CM | POA: Diagnosis not present

## 2021-06-26 DIAGNOSIS — N186 End stage renal disease: Secondary | ICD-10-CM | POA: Diagnosis not present

## 2021-06-26 DIAGNOSIS — D689 Coagulation defect, unspecified: Secondary | ICD-10-CM | POA: Diagnosis not present

## 2021-06-26 DIAGNOSIS — M25611 Stiffness of right shoulder, not elsewhere classified: Secondary | ICD-10-CM

## 2021-06-26 DIAGNOSIS — D631 Anemia in chronic kidney disease: Secondary | ICD-10-CM | POA: Diagnosis not present

## 2021-06-26 DIAGNOSIS — M25652 Stiffness of left hip, not elsewhere classified: Secondary | ICD-10-CM

## 2021-06-26 DIAGNOSIS — M6281 Muscle weakness (generalized): Secondary | ICD-10-CM | POA: Diagnosis not present

## 2021-06-26 DIAGNOSIS — Z992 Dependence on renal dialysis: Secondary | ICD-10-CM | POA: Diagnosis not present

## 2021-06-26 DIAGNOSIS — N2581 Secondary hyperparathyroidism of renal origin: Secondary | ICD-10-CM | POA: Diagnosis not present

## 2021-06-26 DIAGNOSIS — Z7682 Awaiting organ transplant status: Secondary | ICD-10-CM | POA: Diagnosis not present

## 2021-06-26 NOTE — Therapy (Signed)
? ?OUTPATIENT PHYSICAL THERAPY TREATMENT NOTE ? ? ?Patient Name: Carl Manning Sr. ?MRN: [id number] ?DOB:Oct 06, 1960, 61 y.o., male ?Today's Date: 06/26/2021 ? ?PCP: [name], MD ?REFERRING PROVIDER: [name], MD ? ? PT End of Session - 06/26/21 1029   ? ? Visit Number 30   ? Date for PT Re-Evaluation 07/07/21   ? Authorization Type UHC Medicare   ? Progress Note Due on Visit 40   ? PT Start Time 1025   ? PT Stop Time 1054   ? PT Time Calculation (min) 29 min   ? Activity Tolerance Patient tolerated treatment well   ? Behavior During Therapy [facility] for tasks assessed/performed   ? ?  ?  ? ?  ? ? ? ? ? ? ?Past Medical History:  ?Diagnosis Date  ? Acute gastric ulcer with hemorrhage   ? Acute GI bleeding 07/31/2019  ? Acute pericarditis   ? Anemia   ? hx of  ? Anxiety   ? situational   ? Arthritis   ? on meds  ? Borderline diabetes   ? Cataract   ? bilateral sx  ? Chronic headaches   ? Depression   ? situational   ? Diverticulitis   ? ESRD (end stage renal disease) ([place])   ?  On Renal Transplant List," [facility]; MWF" (10/23/2016)  ? GERD (gastroesophageal reflux disease)   ? with certain foods  ? GI bleed   ? Hypertension   ? diet controlled  ? Parathyroid abnormality ([place])   ? ectopic parathyroid gland  ? Presence of arteriovenous fistula for hemodialysis, primary ([place])   ? RUE  ? Refusal of blood product   ? NO WHOLE BLOOD PROUCTS  ? Renal cell carcinoma ([place])   ? s/p hand assisted laparoscopic bilateral nephrectomies 11/29/17, + RCC left  ? Secondary hyperparathyroidism ([place])   ? Seizures ([place])   ? one episode in past, due to" elevated Potassium" 08/02/20- "at least 4 years ago"  ? Sleep apnea   ? doesn't use CPAP anymore since weight loss  ? Stroke [name])   ? no residual  ? ?Past Surgical History:  ?Procedure Laterality Date  ? AV FISTULA PLACEMENT Right   ? right arm  ? BIOPSY  08/01/2019  ? Procedure: BIOPSY;  Surgeon: Juanita Craver, MD;  Location[facility] ENDOSCOPY;  Service: Endoscopy;;  ? BIOPSY  11/18/2020  ?  Procedure: BIOPSY;  Surgeon: Daryel November, MD;  Location: [facility] ENDOSCOPY;  Service: Gastroenterology;;  ? CATARACT EXTRACTION W/ INTRAOCULAR LENS  IMPLANT, BILATERAL    ? COLON SURGERY    ? COLONOSCOPY N/A 08/04/2015  ? Procedure: COLONOSCOPY;  Surgeon: Jerene Bears, MD;  Location: [facility] ENDOSCOPY;  Service: Endoscopy;  Laterality: N/A;  ? COLONOSCOPY  2017  ? JMP@ [facility]-good prep-mass -recall 1 yr  ? COLONOSCOPY WITH PROPOFOL N/A 11/25/2020  ? Procedure: COLONOSCOPY WITH PROPOFOL;  Surgeon: Sharyn Creamer, MD;  Location: [place];  Service: Gastroenterology;  Laterality: N/A;  ? ESOPHAGOGASTRODUODENOSCOPY N/A 08/01/2019  ? Procedure: ESOPHAGOGASTRODUODENOSCOPY (EGD);  Surgeon: Juanita Craver, MD;  Location: [facility] ENDOSCOPY;  Service: Endoscopy;  Laterality: N/A;  ? ESOPHAGOGASTRODUODENOSCOPY N/A 11/18/2020  ? Procedure: ESOPHAGOGASTRODUODENOSCOPY (EGD);  Surgeon: Daryel November, MD;  Location: [place];  Service: Gastroenterology;  Laterality: N/A;  ? ESOPHAGOGASTRODUODENOSCOPY (EGD) WITH PROPOFOL N/A 08/04/2019  ? Procedure: ESOPHAGOGASTRODUODENOSCOPY (EGD) WITH PROPOFOL;  Surgeon: Yetta Flock, MD;  Location: [place];  Service: Gastroenterology;  Laterality: N/A;  ? graft left arm Left   ?  for dialysis x 2. Removed  ? HOT HEMOSTASIS  11/18/2020  ? Procedure: HOT HEMOSTASIS (ARGON PLASMA COAGULATION/BICAP);  Surgeon: Daryel November, MD;  Location: [place];  Service: Gastroenterology;;  ? INSERTION OF DIALYSIS CATHETER    ? Rt chest  ? LAPAROSCOPIC RIGHT COLECTOMY N/A 08/05/2015  ? Procedure: LAPAROSCOPIC RIGHT COLECTOMY- ASCENDING;  Surgeon: Stark Klein, MD;  Location: [place];  Service: General;  Laterality: N/A;  ? MASS EXCISION Left 05/28/2019  ? Procedure: EXCISION SOFT TISSUE MASS LEFT SHOULDER;  Surgeon: Armandina Gemma, MD;  [facility];  Service: General;  Laterality: Left;  ? NEPHRECTOMY Bilateral   ? PARATHYROIDECTOMY N/A 06/12/2016  ? Procedure: TOTAL PARATHYROIDECTOMY  WITH AUTOTRANSPLANTATION TO LEFT FOREARM;  Surgeon: Armandina Gemma, MD;  Location: [place];  Service: General;  Laterality: N/A;  ? PARATHYROIDECTOMY N/A 10/23/2016  ? Procedure: PARATHYROIDECTOMY;  Surgeon: Armandina Gemma, MD;  Location: [place];  Service: General;  Laterality: N/A;  ? REVERSE SHOULDER ARTHROPLASTY Right 08/24/2020  ? Procedure: REVERSE SHOULDER ARTHROPLASTY;  Surgeon: Hiram Gash, MD;  Location: [place];  Service: Orthopedics;  Laterality: Right;  ? REVISON OF ARTERIOVENOUS FISTULA Right 07/16/2017  ? Procedure: REVISION OF ARTERIOVENOUS FISTULA  Right ARM;  Surgeon: Waynetta Sandy, MD;  Location: [place];  Service: Vascular;  Laterality: Right;  ? TOTAL HIP ARTHROPLASTY Left 11/14/2020  ? Procedure: LEFT TOTAL HIP ARTHROPLASTY ANTERIOR APPROACH;  Surgeon: Leandrew Koyanagi, MD;  Location: [place];  Service: Orthopedics;  Laterality: Left;  ? UPPER GASTROINTESTINAL ENDOSCOPY  2021  ? @ [facility]  ? ?Patient Active Problem List  ? Diagnosis Date Noted  ? Mucosal abnormality of esophagus   ? Mucosal abnormality of stomach   ? Mucosal abnormality of duodenum   ? Upper GI bleed   ? Left hip pain 11/16/2020  ? Type 2 diabetes mellitus with other specified complication ([place]) 88/82/8003  ? 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/14/2020  ? Primary osteoarthritis of left hip 10/07/2020  ? Status post reverse total arthroplasty of right shoulder 08/24/2020  ? Diverticulitis 03/08/2020  ? ESRD on hemodialysis ([place]) 03/08/2020  ? Polyneuropathy in diseases classified elsewhere ([place]) 03/08/2020  ? Pure hypercholesterolemia 03/08/2020  ? Sacral back pain 03/08/2020  ? 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 Acute gastric ulcer with hemorrhage   ? Hypotension of hemodialysis 07/31/2019  ? Diverticulitis large intestine 07/31/2019  ? Mass of soft tissue of shoulder 05/26/2019  ? Awaiting organ transplant status 03/18/2019  ? Finding of cocaine in blood 02/25/2019  ? Anaphylactic shock, unspecified, sequela  11/21/2018  ? Hypercalcemia 10/28/2018  ? Diarrhea, unspecified 08/08/2018  ? Hyperkalemia 03/12/2018  ? Patient is Jehovah's Witness 01/30/2018  ? Renal cell carcinoma of left kidney ([place]) 12/24/2017  ? Fluid overload, unspecified 07/06/2017  ? 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 Other specified disorders of kidney and ureter 11/22/2016  ? Secondary hyperparathyroidism of renal origin ([place]) 10/23/2016  ? Other headache syndrome 08/21/2016  ? TIA (transient ischemic attack)   ? Essential hypertension   ? Seizures ([place])   ? 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 Lung nodule   ? Diverticulitis of cecum 07/21/2015  ? Diverticulitis of large intestine without perforation or abscess without bleeding   ? Right lower quadrant pain   ? Obstructive sleep apnea 02/08/2015  ? 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 Diverticulosis 07/13/2014  ? Lower GI bleed 07/12/2014  ? Refusal of blood transfusions as patient  is Jehovah's Witness   ? Parotid mass   ? 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 HLD (hyperlipidemia)   ? PRES (posterior reversible encephalopathy syndrome)   ? History of lacunar cerebrovascular accident (CVA) 05/23/2014  ? Anemia of renal disease 05/23/2014  ? GERD (gastroesophageal reflux disease) 12/09/2012  ? Gastrointestinal bleeding 11/12/2012  ? Melena 11/12/2012  ? Other psychoactive substance dependence, uncomplicated ([place]) 36/62/9476  ? Iron deficiency anemia, unspecified 06/10/2012  ? Moderate protein-calorie malnutrition ([place]) 07/29/2009  ? Coagulation defect, unspecified ([place]) 08/29/2006  ? Type 2 diabetes mellitus with diabetic peripheral angiopathy without gangrene ([place]) 08/29/2006  ? 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 stage renal disease ([place]) 08/02/2006  ? ? ?REFERRING DIAG: M25.561 (ICD-10-CM) - Pain in right knee  R29.898 (ICD-10-CM) - Other symptoms and signs involving the  musculoskeletal system  ?Onset date: Lt hip replacement 09/2020 ?  ?THERAPY DIAG:  ?Muscle weakness (generalized) ?  ?Other abnormalities of gait and mobility ?  ?Stiffness of right shoulder, not elsewhere classif

## 2021-06-28 DIAGNOSIS — D631 Anemia in chronic kidney disease: Secondary | ICD-10-CM | POA: Diagnosis not present

## 2021-06-28 DIAGNOSIS — Z992 Dependence on renal dialysis: Secondary | ICD-10-CM | POA: Diagnosis not present

## 2021-06-28 DIAGNOSIS — Z7682 Awaiting organ transplant status: Secondary | ICD-10-CM | POA: Diagnosis not present

## 2021-06-28 DIAGNOSIS — D509 Iron deficiency anemia, unspecified: Secondary | ICD-10-CM | POA: Diagnosis not present

## 2021-06-28 DIAGNOSIS — N186 End stage renal disease: Secondary | ICD-10-CM | POA: Diagnosis not present

## 2021-06-28 DIAGNOSIS — D689 Coagulation defect, unspecified: Secondary | ICD-10-CM | POA: Diagnosis not present

## 2021-06-28 DIAGNOSIS — N2581 Secondary hyperparathyroidism of renal origin: Secondary | ICD-10-CM | POA: Diagnosis not present

## 2021-06-30 ENCOUNTER — Encounter: Payer: Self-pay | Admitting: Rehabilitative and Restorative Service Providers"

## 2021-06-30 ENCOUNTER — Ambulatory Visit: Payer: Medicare Other | Admitting: Rehabilitative and Restorative Service Providers"

## 2021-06-30 DIAGNOSIS — N2581 Secondary hyperparathyroidism of renal origin: Secondary | ICD-10-CM | POA: Diagnosis not present

## 2021-06-30 DIAGNOSIS — R2689 Other abnormalities of gait and mobility: Secondary | ICD-10-CM

## 2021-06-30 DIAGNOSIS — G8929 Other chronic pain: Secondary | ICD-10-CM | POA: Diagnosis not present

## 2021-06-30 DIAGNOSIS — M1612 Unilateral primary osteoarthritis, left hip: Secondary | ICD-10-CM | POA: Diagnosis not present

## 2021-06-30 DIAGNOSIS — M25562 Pain in left knee: Secondary | ICD-10-CM | POA: Diagnosis not present

## 2021-06-30 DIAGNOSIS — Z992 Dependence on renal dialysis: Secondary | ICD-10-CM | POA: Diagnosis not present

## 2021-06-30 DIAGNOSIS — N186 End stage renal disease: Secondary | ICD-10-CM | POA: Diagnosis not present

## 2021-06-30 DIAGNOSIS — M25611 Stiffness of right shoulder, not elsewhere classified: Secondary | ICD-10-CM | POA: Diagnosis not present

## 2021-06-30 DIAGNOSIS — D509 Iron deficiency anemia, unspecified: Secondary | ICD-10-CM | POA: Diagnosis not present

## 2021-06-30 DIAGNOSIS — M6281 Muscle weakness (generalized): Secondary | ICD-10-CM

## 2021-06-30 DIAGNOSIS — M25552 Pain in left hip: Secondary | ICD-10-CM | POA: Diagnosis not present

## 2021-06-30 DIAGNOSIS — Z7682 Awaiting organ transplant status: Secondary | ICD-10-CM | POA: Diagnosis not present

## 2021-06-30 DIAGNOSIS — E7800 Pure hypercholesterolemia, unspecified: Secondary | ICD-10-CM | POA: Diagnosis not present

## 2021-06-30 DIAGNOSIS — D631 Anemia in chronic kidney disease: Secondary | ICD-10-CM | POA: Diagnosis not present

## 2021-06-30 DIAGNOSIS — M25652 Stiffness of left hip, not elsewhere classified: Secondary | ICD-10-CM | POA: Diagnosis not present

## 2021-06-30 DIAGNOSIS — D689 Coagulation defect, unspecified: Secondary | ICD-10-CM | POA: Diagnosis not present

## 2021-06-30 DIAGNOSIS — M25561 Pain in right knee: Secondary | ICD-10-CM | POA: Diagnosis not present

## 2021-06-30 NOTE — Therapy (Signed)
OUTPATIENT PHYSICAL THERAPY TREATMENT NOTE   Patient Name: Carl Szymborski Sr. MRN: [id number] DOB:Nov 08, 1960, 61 y.o., male Today's Date: 06/30/2021  PCP: [name], MD REFERRING PROVIDER: [name], MD   PT End of Session - 06/30/21 1026     Visit Number 31    Date for PT Re-Evaluation 07/07/21    Authorization Type [employer] Medicare    Progress Note Due on Visit 86    PT Start Time 1022    PT Stop Time 1100    PT Time Calculation (min) 38 min    Activity Tolerance Patient tolerated treatment well    Behavior During Therapy WFL for tasks assessed/performed                 Past Medical History:  Diagnosis Date   Acute gastric ulcer with hemorrhage    Acute GI bleeding 07/31/2019   Acute pericarditis    Anemia    hx of   Anxiety    situational    Arthritis    on meds   Borderline diabetes    Cataract    bilateral sx   Chronic headaches    Depression    situational    Diverticulitis    ESRD (end stage renal disease) ([place])     On Renal Transplant List," [facility]; MWF" (10/23/2016)   GERD (gastroesophageal reflux disease)    with certain foods   GI bleed    Hypertension    diet controlled   Parathyroid abnormality (HCC)    ectopic parathyroid gland   Presence of arteriovenous fistula for hemodialysis, primary ([place])    RUE   Refusal of blood product    NO WHOLE BLOOD PROUCTS   Renal cell carcinoma ([place])    s/p hand assisted laparoscopic bilateral nephrectomies 11/29/17, + RCC left   Secondary hyperparathyroidism ([place])    Seizures ([place])    one episode in past, due to" elevated Potassium" 08/02/20- "at least 4 years ago"   Sleep apnea    doesn't use CPAP anymore since weight loss   Stroke [facility])    no residual   Past Surgical History:  Procedure Laterality Date   AV FISTULA PLACEMENT Right    right arm   BIOPSY  08/01/2019   Procedure: BIOPSY;  Surgeon: Juanita Craver, MD;  Location: [facility] ENDOSCOPY;  Service: Endoscopy;;   BIOPSY  11/18/2020    Procedure: BIOPSY;  Surgeon: Daryel November, MD;  Location: [facility] ENDOSCOPY;  Service: Gastroenterology;;   CATARACT EXTRACTION W/ INTRAOCULAR LENS  IMPLANT, BILATERAL     COLON SURGERY     COLONOSCOPY N/A 08/04/2015   Procedure: COLONOSCOPY;  Surgeon: Jerene Bears, MD;  Location: [facility] ENDOSCOPY;  Service: Endoscopy;  Laterality: N/A;   COLONOSCOPY  2017   JMP@ [facility]-good prep-mass -recall 1 yr   COLONOSCOPY WITH PROPOFOL N/A 11/25/2020   Procedure: COLONOSCOPY WITH PROPOFOL;  Surgeon: Sharyn Creamer, MD;  Location: [place];  Service: Gastroenterology;  Laterality: N/A;   ESOPHAGOGASTRODUODENOSCOPY N/A 08/01/2019   Procedure: ESOPHAGOGASTRODUODENOSCOPY (EGD);  Surgeon: Juanita Craver, MD;  Location: [facility] ENDOSCOPY;  Service: Endoscopy;  Laterality: N/A;   ESOPHAGOGASTRODUODENOSCOPY N/A 11/18/2020   Procedure: ESOPHAGOGASTRODUODENOSCOPY (EGD);  Surgeon: Daryel November, MD;  Location: [place];  Service: Gastroenterology;  Laterality: N/A;   ESOPHAGOGASTRODUODENOSCOPY (EGD) WITH PROPOFOL N/A 08/04/2019   Procedure: ESOPHAGOGASTRODUODENOSCOPY (EGD) WITH PROPOFOL;  Surgeon: Yetta Flock, MD;  Location: [place];  Service: Gastroenterology;  Laterality: N/A;   graft left arm Left  for dialysis x 2. Removed   HOT HEMOSTASIS  11/18/2020   Procedure: HOT HEMOSTASIS (ARGON PLASMA COAGULATION/BICAP);  Surgeon: Daryel November, MD;  Location: [place];  Service: Gastroenterology;;   INSERTION OF DIALYSIS CATHETER     Rt chest   LAPAROSCOPIC RIGHT COLECTOMY N/A 08/05/2015   Procedure: LAPAROSCOPIC RIGHT COLECTOMY- ASCENDING;  Surgeon: Stark Klein, MD;  Location: [address];  Service: General;  Laterality: N/A;   MASS EXCISION Left 05/28/2019   Procedure: EXCISION SOFT TISSUE MASS LEFT SHOULDER;  Surgeon: Armandina Gemma, MD;  Location: [facility];  Service: General;  Laterality: Left;   NEPHRECTOMY Bilateral    PARATHYROIDECTOMY N/A 06/12/2016   Procedure: TOTAL PARATHYROIDECTOMY  WITH AUTOTRANSPLANTATION TO LEFT FOREARM;  Surgeon: Armandina Gemma, MD;  Location: [place];  Service: General;  Laterality: N/A;   PARATHYROIDECTOMY N/A 10/23/2016   Procedure: PARATHYROIDECTOMY;  Surgeon: Armandina Gemma, MD;  Location: [place];  Service: General;  Laterality: N/A;   REVERSE SHOULDER ARTHROPLASTY Right 08/24/2020   Procedure: REVERSE SHOULDER ARTHROPLASTY;  Surgeon: Hiram Gash, MD;  Location: [place];  Service: Orthopedics;  Laterality: Right;   REVISON OF ARTERIOVENOUS FISTULA Right 07/16/2017   Procedure: REVISION OF ARTERIOVENOUS FISTULA  Right ARM;  Surgeon: Waynetta Sandy, MD;  Location: [place];  Service: Vascular;  Laterality: Right;   TOTAL HIP ARTHROPLASTY Left 11/14/2020   Procedure: LEFT TOTAL HIP ARTHROPLASTY ANTERIOR APPROACH;  Surgeon: Leandrew Koyanagi, MD;  Location: [place];  Service: Orthopedics;  Laterality: Left;   UPPER GASTROINTESTINAL ENDOSCOPY  2021   @ [facility]   Patient Active Problem List   Diagnosis Date Noted   Mucosal abnormality of esophagus    Mucosal abnormality of stomach    Mucosal abnormality of duodenum    Upper GI bleed    Left hip pain 11/16/2020   Type 2 diabetes mellitus with other specified complication ([place]) 16/11/9602   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/14/2020   Primary osteoarthritis of left hip 10/07/2020   Status post reverse total arthroplasty of right shoulder 08/24/2020   Diverticulitis 03/08/2020   ESRD on hemodialysis ([place]) 03/08/2020   Polyneuropathy in diseases classified elsewhere ([place]) 03/08/2020   Pure hypercholesterolemia 03/08/2020   Sacral back pain 03/08/2020   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 11/10/2019   Acute gastric ulcer with hemorrhage    Hypotension of hemodialysis 07/31/2019   Diverticulitis large intestine 07/31/2019   Mass of soft tissue of shoulder 05/26/2019   Awaiting organ transplant status 03/18/2019   Finding of cocaine in blood 02/25/2019   Anaphylactic shock, unspecified, sequela  11/21/2018   Hypercalcemia 10/28/2018   Diarrhea, unspecified 08/08/2018   Hyperkalemia 03/12/2018   Patient is Jehovah's Witness 01/30/2018   Renal cell carcinoma of left kidney ([place]) 12/24/2017   Fluid overload, unspecified 07/06/2017   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   Other specified disorders of kidney and ureter 11/22/2016   Secondary hyperparathyroidism of renal origin ([place]) 10/23/2016   Other headache syndrome 08/21/2016   TIA (transient ischemic attack)    Essential hypertension    Seizures ([place])    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    Lung nodule    Diverticulitis of cecum 07/21/2015   Diverticulitis of large intestine without perforation or abscess without bleeding    Right lower quadrant pain    Obstructive sleep apnea 02/08/2015   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   Diverticulosis 07/13/2014   Lower GI bleed 07/12/2014   Refusal of blood transfusions as patient  is Jehovah's Witness    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   HLD (hyperlipidemia)    PRES (posterior reversible encephalopathy syndrome)    History of lacunar cerebrovascular accident (CVA) 05/23/2014   Anemia of renal disease 05/23/2014   GERD (gastroesophageal reflux disease) 12/09/2012   Gastrointestinal bleeding 11/12/2012   Melena 11/12/2012   Other psychoactive substance dependence, uncomplicated ([place]) 32/99/2426   Iron deficiency anemia, unspecified 06/10/2012   Moderate protein-calorie malnutrition ([place]) 07/29/2009   Coagulation defect, unspecified ([place]) 08/29/2006   Type 2 diabetes mellitus with diabetic peripheral angiopathy without gangrene ([place]) 08/29/2006   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 stage renal disease ([place]) 08/02/2006    REFERRING DIAG: M25.561 (ICD-10-CM) - Pain in right knee  R29.898 (ICD-10-CM) - Other symptoms and signs involving the  musculoskeletal system  Onset date: Lt hip replacement 09/2020   THERAPY DIAG:  Muscle weakness (generalized)   Other abnormalities of gait and mobility   Stiffness of right shoulder, not elsewhere classified   Chronic pain of left knee   PERTINENT HISTORY: Lt THA in August 2022, Rt shoulder rotator cuff repair March 2022. Dialysis   PRECAUTIONS: Dialysis 3x/wk   SUBJECTIVE: Pt reports that he put some Voltaren on his knee this morning and he is not having any pain currently.   PAIN:  Are you having pain? No NPRS scale: 0/10  Pain location: Rt knee Pain orientation: Right  PAIN TYPE: aching Pain description: intermittent  Aggravating factors: unknown Relieving factors: unknown   OBJECTIVE:  SHOULDER ROM AND MUSCLE STRENGTH: 06/26/2021: A/ROM right shoulder measured in standing:  flexion 105 degrees, 95 degrees abduction.  Scapular elevation noted MMT:  R shoulder strength 4 to 4+/5 throughout.  05/06/21: A/ROM right shoulder measured in standing: flexion 94 degrees, 83 degrees abduction- plateau with this.  Scapular elevation   MMT:  R shoulder strength: flexion 4-/5, IR 4+/5, ER 4-/5, Lt hip flexion 4/5, abduction 4+/5, hip extension 4-/5  04/24/21: A/ROM right shoulder measured in standing: flexion 96 degrees, 85 degrees abduction- plateau with this.  Scapular elevation   MMT:  R shoulder strength: flexion 4-/5, IR 4/5, ER 4-/5, Lt knee: 4+/5, hip flexion 4/5  04/03/2021: A/ROM right shoulder measured in standing: flexion 95 degrees, 87 degrees abduction   MMT:  R shoulder strength of 3/5, B hips and knee strength of 4- to 4/5   TODAY'S TREATMENT:   06/30/2021: Nustep L5 x6 min with PT present to discuss status Leg Press 115# 3x10, seat at 9.  Unilateral only 60# 2x10 bilat Shoulder flexion and scaption wall wash with 2# weight around wrist.  2x10 RUE Barre push ups 2x10 FWD and side step ups x10 each bilat on 9" metal stool  06/26/2021: Nustep L5 x5 min with PT  present to discuss status Leg Press 115# 3x10, seat at 9.  Unilateral only 60# 2x10 bilat FOTO knee:  73% Shoulder flexion and scaption 2# weight around wrist.  2x10 RUE UBE level 2.2 x 2 min each way  with PT monitoring for posture Barre push ups 2x10 Mini squat: holding 5# kettlebell.  X10 with chest press- verbal cues to reduce lumbar flexion Rows 40# 2x10 Standing lat pull 40# 2x10 Triceps Pressdown 30# 2x10 UBE level 2.1 x 6 min (3 min each way) with PT monitoring for posture  06/19/2021: Nustep L5 x 6 min with PT present to discuss status Leg Press 135# 3x10,  seat at 9.  RLE then LtLE only 60# 2x10 each Barre push ups 2x10 UBE level 2.1 x 6 min (3/3)  with PT monitoring for posture Shoulder flexion and scaption 2# weight around wrist.  2x10 RUE Mini squat: holding 5# kettlebell.  X10 with chest press- verbal cues to reduce lumbar flexion  06/08/2021: Nustep L5 x5 min with PT present to discuss status Leg Press 135# 2x10, seat at 9.  RLE then LtLE only 60# 2x10 each Barre push ups 2x10 UBE level 2.1 x 4 min (2 min each way) with PT monitoring for posture Shoulder flexion and scaption 2# weight around wrist.  2x10 RUE Mini squat: holding 5# kettlebell.  X10 with chest press- verbal cues to reduce lumbar flexion    PATIENT EDUCATION: Education details: [id number] Person educated: Patient Education method: Explanation, Demonstration, and Handouts Education comprehension: verbalized understanding and returned demonstration   HOME EXERCISE PROGRAM: Access Code: [email or web address] URL: [email or web address] Date: 05/05/2021 Prepared by: [facility]  Exercises - Shoulder Flexion Wall Slide with Towel  - 1-2 x daily - 7 x weekly - 2 sets - 10 reps - Standing Hip Abduction with Counter Support  - 1 x daily - 7 x weekly - 2 sets - 10 reps - Standing Hip Extension with Counter Support  - 1 x daily - 7 x weekly - 2 sets - 10 reps -  Forward Step Up  - 1 x daily - 7 x weekly - 2 sets - 10 reps - Standing Row with Anchored Resistance  - 1 x daily - 7 x weekly - 2 sets - 10 reps - Squat with Chair Touch  - 1 x daily - 7 x weekly - 3 sets - 10 reps - Supine Shoulder Flexion with Anchored Resistance  - 2-3 x daily - 7 x weekly - 1 sets - 8-10 reps - Sidelying Shoulder Abduction Palm Forward  - 1 x daily - 7 x weekly - 2 sets - 10 reps     PT Short Term Goals - 04/03/21 1147                     PT SHORT TERM GOAL #1    Title Pt will be independent with initial HEP.     Status Achieved                     PT Long Term Goals - 04/03/21 1147                     PT LONG TERM GOAL #1    Title be independent in advanced HEP     Status In progress (further advancement is needed)           PT LONG TERM GOAL #2    Title improve BLE strength to at least 4+/5 to negotiate steps and ambulate in the community with improved safety Status: 06/06/21- no pain.  Strength remains limited in Lt LE (see above)    Status On-going            PT LONG TERM GOAL #3    Title Pt to increase RT shoulder flexion and abduction to at least 115 degrees to allow him to reach into overhead cabinets  Status 06/26/21: flexion 105, abduction 95    Status Ongoing           PT LONG TERM GOAL #4    Title Pt  able to negotiate steps with reciprocal pattern with use of unilateral rail to  allow him to do laundry in his home.  Status: Pt reports that he can use reciprocal pattern with stairs    Status Achieved           PT LONG TERM GOAL #5    Title Increase FOTO to at least 63% to allow pt to perform activities with decreased complication.      Status Achieved           PT LONG TERM GOAL #6  Demonstrate Rt shoulder flexion and elbow flexion to > or = to 4 to 4+/5 to improve ability to reach overhead lift and carry objects 4 weeks     ASSESSMENT:   CLINICAL IMPRESSION:   Mr Shell presents to PT reporting that he is ready to participate.   When asked about his shoulder recovery, pt reports feeling at least 70% better, when asked about knee/hip he reports feeling 60-70% better.  States that he can sit for longer and use his arms more during the day.  Pt able to tolerate weight machines without difficulty.  Pt continues to require skilled rehabilitation to progress towards goal related activities.   PERSONAL FACTORS AND COMORBIDITIES:  L THA, R shoulder surgery, ESRD on hemodialysis.   Patient will benefit from skilled therapeutic intervention in order to improve on the following deficits:  Decreased range of motion; Difficulty walking; Increased muscle spasms; Impaired UE functional use; Pain; Impaired flexibility; Decreased strength   PT FREQUENCY: 2x/week   PT DURATION: 4 weeks   Rehab Potential:  Good   Stability/Clinical Decision Making:  Evolving/Moderate complexity   PLANNED INTERVENTIONS: Therapeutic exercises, Therapeutic activity, Neuro Muscular re-education, Balance training, Gait training, Patient/Family education, Joint mobilization, Stair training, Dry Needling, Cryotherapy, Moist heat, Taping, and Manual therapy   PLAN FOR NEXT SESSION: Rt UE strength and endurance for functional activities, glute strength and functional mobility.  30th visit    Adelfo Diebel PT 06/30/21 11:00 AM    [place] @ [address] [place], [place], [address] Phone: [phone]   Fax:  [phone]

## 2021-07-03 ENCOUNTER — Ambulatory Visit (AMBULATORY_SURGERY_CENTER): Payer: Medicare Other | Admitting: *Deleted

## 2021-07-03 ENCOUNTER — Encounter (HOSPITAL_COMMUNITY): Payer: Self-pay | Admitting: Gastroenterology

## 2021-07-03 VITALS — Ht 74.0 in | Wt 198.6 lb

## 2021-07-03 DIAGNOSIS — Z7682 Awaiting organ transplant status: Secondary | ICD-10-CM | POA: Diagnosis not present

## 2021-07-03 DIAGNOSIS — Z1212 Encounter for screening for malignant neoplasm of rectum: Secondary | ICD-10-CM

## 2021-07-03 DIAGNOSIS — D689 Coagulation defect, unspecified: Secondary | ICD-10-CM | POA: Diagnosis not present

## 2021-07-03 DIAGNOSIS — D631 Anemia in chronic kidney disease: Secondary | ICD-10-CM | POA: Diagnosis not present

## 2021-07-03 DIAGNOSIS — Z1211 Encounter for screening for malignant neoplasm of colon: Secondary | ICD-10-CM

## 2021-07-03 DIAGNOSIS — K250 Acute gastric ulcer with hemorrhage: Secondary | ICD-10-CM

## 2021-07-03 DIAGNOSIS — N186 End stage renal disease: Secondary | ICD-10-CM | POA: Diagnosis not present

## 2021-07-03 DIAGNOSIS — N2581 Secondary hyperparathyroidism of renal origin: Secondary | ICD-10-CM | POA: Diagnosis not present

## 2021-07-03 DIAGNOSIS — D509 Iron deficiency anemia, unspecified: Secondary | ICD-10-CM | POA: Diagnosis not present

## 2021-07-03 DIAGNOSIS — Z992 Dependence on renal dialysis: Secondary | ICD-10-CM | POA: Diagnosis not present

## 2021-07-03 MED ORDER — PEG 3350-KCL-NA BICARB-NACL 420 G PO SOLR: 4000.0000 mL | Freq: Once | ORAL | 0 refills | Status: AC

## 2021-07-03 NOTE — Progress Notes (Signed)
No egg or soy allergy known to patient  No issues known to pt with past sedation with any surgeries or procedures Patient denies ever being told they had issues or difficulty with intubation  No FH of Malignant Hyperthermia Pt is not on diet pills Pt is not on  home 02  Pt is not on blood thinners  Pt denies issues with constipation  No A fib or A flutter   Discussed with pt there will be an out-of-pocket cost for prep and that varies from $0 to 70 +  dollars - pt verbalized understanding     PV completed over the phone. Pt verified name, DOB, address and insurance during PV today. PT WILL PICK UP PACKET 2ND FLOOR TUES OR WED THIS WEEK PER PT   Pt encouraged to call with questions or issues.   If pt has My chart, procedure instructions sent via My Chart   Insurance confirmed with pt at [facility] today

## 2021-07-04 ENCOUNTER — Ambulatory Visit: Payer: Medicare Other

## 2021-07-04 DIAGNOSIS — M25562 Pain in left knee: Secondary | ICD-10-CM | POA: Diagnosis not present

## 2021-07-04 DIAGNOSIS — M25552 Pain in left hip: Secondary | ICD-10-CM

## 2021-07-04 DIAGNOSIS — M6281 Muscle weakness (generalized): Secondary | ICD-10-CM | POA: Diagnosis not present

## 2021-07-04 DIAGNOSIS — R2689 Other abnormalities of gait and mobility: Secondary | ICD-10-CM | POA: Diagnosis not present

## 2021-07-04 DIAGNOSIS — M25652 Stiffness of left hip, not elsewhere classified: Secondary | ICD-10-CM | POA: Diagnosis not present

## 2021-07-04 DIAGNOSIS — M25611 Stiffness of right shoulder, not elsewhere classified: Secondary | ICD-10-CM

## 2021-07-04 DIAGNOSIS — M25561 Pain in right knee: Secondary | ICD-10-CM | POA: Diagnosis not present

## 2021-07-04 DIAGNOSIS — G8929 Other chronic pain: Secondary | ICD-10-CM | POA: Diagnosis not present

## 2021-07-04 NOTE — Therapy (Addendum)
OUTPATIENT PHYSICAL THERAPY TREATMENT NOTE   Patient Name: [name]. MRN: [id number] DOB:04-02-1960, 61 y.o., male 44 Date: 07/04/2021  PCP: [name], MD REFERRING PROVIDER: [name], MD   PT End of Session - 07/04/21 1058     Visit Number 32    Date for PT Re-Evaluation 07/07/21    Authorization Type [employer] Medicare    Progress Note Due on Visit 80    PT Start Time 1029    PT Stop Time 1059    PT Time Calculation (min) 30 min    Activity Tolerance Patient tolerated treatment well    Behavior During Therapy WFL for tasks assessed/performed                  Past Medical History:  Diagnosis Date   Acute gastric ulcer with hemorrhage    Acute GI bleeding 07/31/2019   Acute pericarditis    Anemia    hx of   Anxiety    situational    Arthritis    on meds   Borderline diabetes    Cataract    bilateral sx   Chronic headaches    Depression    situational    Diverticulitis    ESRD (end stage renal disease) ([place])     On Renal Transplant List," [facility]; MWF" (10/23/2016)   GERD (gastroesophageal reflux disease)    with certain foods   GI bleed    Hypertension    diet controlled   Parathyroid abnormality (HCC)    ectopic parathyroid gland   Presence of arteriovenous fistula for hemodialysis, primary ([place])    RUE PER PT RLE   Refusal of blood product    NO WHOLE BLOOD PROUCTS   Renal cell carcinoma ([place])    s/p hand assisted laparoscopic bilateral nephrectomies 11/29/17, + RCC left   Secondary hyperparathyroidism ([place])    Seizures ([place])    one episode in past, due to" elevated Potassium" 08/02/20- "at least 4 years ago"   Sleep apnea    doesn't use CPAP anymore since weight loss   Stroke [facility])    no residual   Past Surgical History:  Procedure Laterality Date   AV FISTULA PLACEMENT Right    right arm   BIOPSY  08/01/2019   Procedure: BIOPSY;  Surgeon: Juanita Craver, MD;  Location: [facility] ENDOSCOPY;  Service: Endoscopy;;   BIOPSY   11/18/2020   Procedure: BIOPSY;  Surgeon: Daryel November, MD;  Location: [facility] ENDOSCOPY;  Service: Gastroenterology;;   CATARACT EXTRACTION W/ INTRAOCULAR LENS  IMPLANT, BILATERAL     COLON SURGERY     COLONOSCOPY N/A 08/04/2015   Procedure: COLONOSCOPY;  Surgeon: Jerene Bears, MD;  Location: [facility] ENDOSCOPY;  Service: Endoscopy;  Laterality: N/A;   COLONOSCOPY  2017   JMP@ [facility]-good prep-mass -recall 1 yr   COLONOSCOPY WITH PROPOFOL N/A 11/25/2020   Procedure: COLONOSCOPY WITH PROPOFOL;  Surgeon: Sharyn Creamer, MD;  Location: [place];  Service: Gastroenterology;  Laterality: N/A;   ESOPHAGOGASTRODUODENOSCOPY N/A 08/01/2019   Procedure: ESOPHAGOGASTRODUODENOSCOPY (EGD);  Surgeon: Juanita Craver, MD;  Location: [facility] ENDOSCOPY;  Service: Endoscopy;  Laterality: N/A;   ESOPHAGOGASTRODUODENOSCOPY N/A 11/18/2020   Procedure: ESOPHAGOGASTRODUODENOSCOPY (EGD);  Surgeon: Daryel November, MD;  Location: [place];  Service: Gastroenterology;  Laterality: N/A;   ESOPHAGOGASTRODUODENOSCOPY (EGD) WITH PROPOFOL N/A 08/04/2019   Procedure: ESOPHAGOGASTRODUODENOSCOPY (EGD) WITH PROPOFOL;  Surgeon: Yetta Flock, MD;  Location: [place];  Service: Gastroenterology;  Laterality: N/A;   graft left arm  Left    for dialysis x 2. Removed   HOT HEMOSTASIS  11/18/2020   Procedure: HOT HEMOSTASIS (ARGON PLASMA COAGULATION/BICAP);  Surgeon: Daryel November, MD;  Location: [place];  Service: Gastroenterology;;   INSERTION OF DIALYSIS CATHETER     Rt chest   LAPAROSCOPIC RIGHT COLECTOMY N/A 08/05/2015   Procedure: LAPAROSCOPIC RIGHT COLECTOMY- ASCENDING;  Surgeon: Stark Klein, MD;  Location: [place];  Service: General;  Laterality: N/A;   MASS EXCISION Left 05/28/2019   Procedure: EXCISION SOFT TISSUE MASS LEFT SHOULDER;  Surgeon: Armandina Gemma, MD;  Location: [facility];  Service: General;  Laterality: Left;   NEPHRECTOMY Bilateral    PARATHYROIDECTOMY N/A 06/12/2016   Procedure: TOTAL  PARATHYROIDECTOMY WITH AUTOTRANSPLANTATION TO LEFT FOREARM;  Surgeon: Armandina Gemma, MD;  Location: [place];  Service: General;  Laterality: N/A;   PARATHYROIDECTOMY N/A 10/23/2016   Procedure: PARATHYROIDECTOMY;  Surgeon: Armandina Gemma, MD;  Location: [place];  Service: General;  Laterality: N/A;   REVERSE SHOULDER ARTHROPLASTY Right 08/24/2020   Procedure: REVERSE SHOULDER ARTHROPLASTY;  Surgeon: Hiram Gash, MD;  Location: [place];  Service: Orthopedics;  Laterality: Right;   REVISON OF ARTERIOVENOUS FISTULA Right 07/16/2017   Procedure: REVISION OF ARTERIOVENOUS FISTULA  Right ARM;  Surgeon: Waynetta Sandy, MD;  Location: [place];  Service: Vascular;  Laterality: Right;   TOTAL HIP ARTHROPLASTY Left 11/14/2020   Procedure: LEFT TOTAL HIP ARTHROPLASTY ANTERIOR APPROACH;  Surgeon: Leandrew Koyanagi, MD;  Location: [place];  Service: Orthopedics;  Laterality: Left;   UPPER GASTROINTESTINAL ENDOSCOPY  2021   @ [facility]   Patient Active Problem List   Diagnosis Date Noted   Mucosal abnormality of esophagus    Mucosal abnormality of stomach    Mucosal abnormality of duodenum    Upper GI bleed    Left hip pain 11/16/2020   Type 2 diabetes mellitus with other specified complication ([place]) 30/10/2328   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/14/2020   Primary osteoarthritis of left hip 10/07/2020   Status post reverse total arthroplasty of right shoulder 08/24/2020   Diverticulitis 03/08/2020   ESRD on hemodialysis ([place]) 03/08/2020   Polyneuropathy in diseases classified elsewhere ([place]) 03/08/2020   Pure hypercholesterolemia 03/08/2020   Sacral back pain 03/08/2020   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 11/10/2019   Acute gastric ulcer with hemorrhage    Hypotension of hemodialysis 07/31/2019   Diverticulitis large intestine 07/31/2019   Mass of soft tissue of shoulder 05/26/2019   Awaiting organ transplant status 03/18/2019   Finding of cocaine in blood 02/25/2019   Anaphylactic shock,  unspecified, sequela 11/21/2018   Hypercalcemia 10/28/2018   Diarrhea, unspecified 08/08/2018   Hyperkalemia 03/12/2018   Patient is Jehovah's Witness 01/30/2018   Renal cell carcinoma of left kidney ([place]) 12/24/2017   Fluid overload, unspecified 07/06/2017   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   Other specified disorders of kidney and ureter 11/22/2016   Secondary hyperparathyroidism of renal origin ([place]) 10/23/2016   Other headache syndrome 08/21/2016   TIA (transient ischemic attack)    Essential hypertension    Seizures ([place])    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    Lung nodule    Diverticulitis of cecum 07/21/2015   Diverticulitis of large intestine without perforation or abscess without bleeding    Right lower quadrant pain    Obstructive sleep apnea 02/08/2015   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   Diverticulosis 07/13/2014   Lower GI bleed 07/12/2014   Refusal of  blood transfusions as patient is Jehovah's Witness    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   HLD (hyperlipidemia)    PRES (posterior reversible encephalopathy syndrome)    History of lacunar cerebrovascular accident (CVA) 05/23/2014   Anemia of renal disease 05/23/2014   GERD (gastroesophageal reflux disease) 12/09/2012   Gastrointestinal bleeding 11/12/2012   Melena 11/12/2012   Other psychoactive substance dependence, uncomplicated ([place]) 85/92/9244   Iron deficiency anemia, unspecified 06/10/2012   Moderate protein-calorie malnutrition ([place]) 07/29/2009   Coagulation defect, unspecified ([place]) 08/29/2006   Type 2 diabetes mellitus with diabetic peripheral angiopathy without gangrene ([place]) 08/29/2006   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 Dialysis   PRECAUTIONS: Dialysis 3x/wk   SUBJECTIVE: I'm doing pretty good today.  No pain.     PAIN:  Are you having pain? No NPRS scale: 0/10  Pain location: Rt knee Pain orientation: Right  PAIN TYPE: aching Pain description: intermittent  Aggravating factors: unknown Relieving factors: unknown   OBJECTIVE:  SHOULDER ROM AND MUSCLE STRENGTH: 06/26/2021: A/ROM right shoulder measured in standing:  flexion 105 degrees, 95 degrees abduction.  Scapular elevation noted MMT:  R shoulder strength 4 to 4+/5 throughout.  05/06/21: A/ROM right shoulder measured in standing: flexion 94 degrees, 83 degrees abduction- plateau with this.  Scapular elevation   MMT:  R shoulder strength: flexion 4-/5, IR 4+/5, ER 4-/5, Lt hip flexion 4/5, abduction 4+/5, hip extension 4-/5  04/24/21: A/ROM right shoulder measured in standing: flexion 96 degrees, 85 degrees abduction- plateau with this.  Scapular elevation   MMT:  R shoulder strength: flexion 4-/5, IR 4/5, ER 4-/5, Lt knee: 4+/5, hip flexion 4/5  04/03/2021: A/ROM right shoulder measured in standing: flexion 95 degrees, 87 degrees abduction   MMT:  R shoulder strength of 3/5, B hips and knee strength of 4- to 4/5   TODAY'S TREATMENT:  07/04/2021: Nustep L5 x5 min with PT present to discuss status Leg Press 115# 3x10, seat at 9.  Unilateral only 60# 2x10 Rt and Lt Shoulder flexion and scaption 2# weight around wrist.  2x10 RUE UBE level 2.2 x 6 min (3 min each way) with PT monitoring for posture Barre push ups 2x10 Mini squat: holding 5# kettlebell.  X10 with chest press- verbal cues to  reduce lumbar flexion Rows 40# 2x10 Standing lat pull 40# 2x10 Triceps Pressdown 30# 2x10  06/30/2021: Nustep L5 x6 min with PT present to discuss status Leg Press 115# 3x10, seat at 9.  Unilateral only 60# 2x10 bilat Shoulder flexion and scaption wall wash with 2# weight around wrist.  2x10 RUE Barre push ups 2x10 FWD and side step ups x10 each bilat on 9" metal stool  06/26/2021: Nustep L5 x5 min with PT present to discuss status Leg Press 115# 3x10, seat at 9.  Unilateral only 60# 2x10 bilat FOTO knee:  73% Shoulder flexion and scaption 2# weight around wrist.  2x10 RUE  UBE level 2.2 x 2 min each way  with PT monitoring for posture Barre push ups 2x10 Mini squat: holding 5# kettlebell.  X10 with chest press- verbal cues to reduce lumbar flexion Rows 40# 2x10 Standing lat pull 40# 2x10 Triceps Pressdown 30# 2x10 UBE level 2.1 x 6 min (3 min each way) with PT monitoring for posture   PATIENT EDUCATION: Education details: [id number] Person educated: Patient Education method: Explanation, Demonstration, and Handouts Education comprehension: verbalized understanding and returned demonstration   HOME EXERCISE PROGRAM: Access Code: [email or web address] URL: [email or web address] Date: 05/05/2021 Prepared by: [facility]  Exercises - Shoulder Flexion Wall Slide with Towel  - 1-2 x daily - 7 x weekly - 2 sets - 10 reps - Standing Hip Abduction with Counter Support  - 1 x daily - 7 x weekly - 2 sets - 10 reps - Standing Hip Extension with Counter Support  - 1 x daily - 7 x weekly - 2 sets - 10 reps - Forward Step Up  - 1 x daily - 7 x weekly - 2 sets - 10 reps - Standing Row with Anchored Resistance  - 1 x daily - 7 x weekly - 2 sets - 10 reps - Squat with Chair Touch  - 1 x daily - 7 x weekly - 3 sets - 10 reps - Supine Shoulder Flexion with Anchored Resistance  - 2-3 x daily - 7 x weekly - 1 sets - 8-10 reps - Sidelying Shoulder  Abduction Palm Forward  - 1 x daily - 7 x weekly - 2 sets - 10 reps     PT Short Term Goals - 04/03/21 1147                     PT SHORT TERM GOAL #1    Title Pt will be independent with initial HEP.     Status Achieved                     PT Long Term Goals - 04/03/21 1147                     PT LONG TERM GOAL #1    Title be independent in advanced HEP     Status In progress (further advancement is needed)           PT LONG TERM GOAL #2    Title improve BLE strength to at least 4+/5 to negotiate steps and ambulate in the community with improved safety Status: 06/06/21- no pain.  Strength remains limited in Lt LE (see above)    Status On-going            PT LONG TERM GOAL #3    Title Pt to increase RT shoulder flexion and abduction to at least 115 degrees to allow him to reach into overhead cabinets  Status 06/26/21: flexion 105, abduction 95    Status Ongoing           PT LONG TERM GOAL #4    Title Pt able to negotiate steps with reciprocal pattern with use of unilateral rail to  allow him to do laundry in his home.  Status: Pt reports that he can use reciprocal pattern with stairs    Status Achieved           PT LONG TERM GOAL #5    Title Increase FOTO to at least 63% to allow pt to  perform activities with decreased complication.  (73)    Status Achieved           PT LONG TERM GOAL #6  Demonstrate Rt shoulder flexion and elbow flexion to > or = to 4 to 4+/5 to improve ability to reach overhead lift and carry objects 4 weeks     ASSESSMENT:   CLINICAL IMPRESSION:   Pt continues to report 70% overall improvement in shoulder and hip since the start of care.  Pt denies any functional limitations at this time related to either area. Pt does experience intermittent and brief pain in Rt shoulder with movement but this is not limiting.  States that he can sit for longer and use his arms more during the day.  Pt able to tolerate weight machines with additional weight today.    1 more session probable.     PERSONAL FACTORS AND COMORBIDITIES:  L THA, R shoulder surgery, ESRD on hemodialysis.   Patient will benefit from skilled therapeutic intervention in order to improve on the following deficits:  Decreased range of motion; Difficulty walking; Increased muscle spasms; Impaired UE functional use; Pain; Impaired flexibility; Decreased strength   PT FREQUENCY: 2x/week   PT DURATION: 4 weeks   Rehab Potential:  Good   Stability/Clinical Decision Making:  Evolving/Moderate complexity   PLANNED INTERVENTIONS: Therapeutic exercises, Therapeutic activity, Neuro Muscular re-education, Balance training, Gait training, Patient/Family education, Joint mobilization, Stair training, Dry Needling, Cryotherapy, Moist heat, Taping, and Manual therapy   PLAN FOR NEXT SESSION: Probable D/C next session to HEP.  Final goal assessment and review of HEP   Sigurd Sos, PT 07/04/21 10:59 AM   PHYSICAL THERAPY DISCHARGE SUMMARY  Visits from Start of Care: 32  Current functional level related to goals / functional outcomes: See above for most current PT status.     Remaining deficits: See above   Education / Equipment: HEP   Patient agrees to discharge. Patient goals were partially met. Patient is being discharged due to being pleased with the current functional level.  Sigurd Sos, PT 10/12/21 7:54 AM   [facility] @ [address] [place], [place], [address] Phone: [phone]   Fax:  [phone]

## 2021-07-05 DIAGNOSIS — D631 Anemia in chronic kidney disease: Secondary | ICD-10-CM | POA: Diagnosis not present

## 2021-07-05 DIAGNOSIS — N2581 Secondary hyperparathyroidism of renal origin: Secondary | ICD-10-CM | POA: Diagnosis not present

## 2021-07-05 DIAGNOSIS — D689 Coagulation defect, unspecified: Secondary | ICD-10-CM | POA: Diagnosis not present

## 2021-07-05 DIAGNOSIS — Z992 Dependence on renal dialysis: Secondary | ICD-10-CM | POA: Diagnosis not present

## 2021-07-05 DIAGNOSIS — Z7682 Awaiting organ transplant status: Secondary | ICD-10-CM | POA: Diagnosis not present

## 2021-07-05 DIAGNOSIS — D509 Iron deficiency anemia, unspecified: Secondary | ICD-10-CM | POA: Diagnosis not present

## 2021-07-05 DIAGNOSIS — N186 End stage renal disease: Secondary | ICD-10-CM | POA: Diagnosis not present

## 2021-07-07 ENCOUNTER — Ambulatory Visit: Payer: Medicare Other | Admitting: Rehabilitative and Restorative Service Providers"

## 2021-07-07 DIAGNOSIS — Z992 Dependence on renal dialysis: Secondary | ICD-10-CM | POA: Diagnosis not present

## 2021-07-07 DIAGNOSIS — D689 Coagulation defect, unspecified: Secondary | ICD-10-CM | POA: Diagnosis not present

## 2021-07-07 DIAGNOSIS — N2581 Secondary hyperparathyroidism of renal origin: Secondary | ICD-10-CM | POA: Diagnosis not present

## 2021-07-07 DIAGNOSIS — N186 End stage renal disease: Secondary | ICD-10-CM | POA: Diagnosis not present

## 2021-07-07 DIAGNOSIS — Z7682 Awaiting organ transplant status: Secondary | ICD-10-CM | POA: Diagnosis not present

## 2021-07-07 DIAGNOSIS — D631 Anemia in chronic kidney disease: Secondary | ICD-10-CM | POA: Diagnosis not present

## 2021-07-07 DIAGNOSIS — D509 Iron deficiency anemia, unspecified: Secondary | ICD-10-CM | POA: Diagnosis not present

## 2021-07-10 DIAGNOSIS — D509 Iron deficiency anemia, unspecified: Secondary | ICD-10-CM | POA: Diagnosis not present

## 2021-07-10 DIAGNOSIS — D631 Anemia in chronic kidney disease: Secondary | ICD-10-CM | POA: Diagnosis not present

## 2021-07-10 DIAGNOSIS — Z7682 Awaiting organ transplant status: Secondary | ICD-10-CM | POA: Diagnosis not present

## 2021-07-10 DIAGNOSIS — N2581 Secondary hyperparathyroidism of renal origin: Secondary | ICD-10-CM | POA: Diagnosis not present

## 2021-07-10 DIAGNOSIS — Z992 Dependence on renal dialysis: Secondary | ICD-10-CM | POA: Diagnosis not present

## 2021-07-10 DIAGNOSIS — N186 End stage renal disease: Secondary | ICD-10-CM | POA: Diagnosis not present

## 2021-07-10 DIAGNOSIS — D689 Coagulation defect, unspecified: Secondary | ICD-10-CM | POA: Diagnosis not present

## 2021-07-11 ENCOUNTER — Ambulatory Visit: Payer: Medicare Other | Admitting: Rehabilitative and Restorative Service Providers"

## 2021-07-12 ENCOUNTER — Telehealth: Payer: Self-pay | Admitting: Gastroenterology

## 2021-07-12 DIAGNOSIS — D631 Anemia in chronic kidney disease: Secondary | ICD-10-CM | POA: Diagnosis not present

## 2021-07-12 DIAGNOSIS — N2581 Secondary hyperparathyroidism of renal origin: Secondary | ICD-10-CM | POA: Diagnosis not present

## 2021-07-12 DIAGNOSIS — N186 End stage renal disease: Secondary | ICD-10-CM | POA: Diagnosis not present

## 2021-07-12 DIAGNOSIS — Z992 Dependence on renal dialysis: Secondary | ICD-10-CM | POA: Diagnosis not present

## 2021-07-12 DIAGNOSIS — D509 Iron deficiency anemia, unspecified: Secondary | ICD-10-CM | POA: Diagnosis not present

## 2021-07-12 DIAGNOSIS — I12 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 stage renal disease: Secondary | ICD-10-CM | POA: Diagnosis not present

## 2021-07-12 DIAGNOSIS — D689 Coagulation defect, unspecified: Secondary | ICD-10-CM | POA: Diagnosis not present

## 2021-07-12 DIAGNOSIS — Z7682 Awaiting organ transplant status: Secondary | ICD-10-CM | POA: Diagnosis not present

## 2021-07-12 NOTE — Telephone Encounter (Signed)
Inbound call from patient stating he has been under the weather and needs to reschedule his procedure for tomorrow with Dr. Candis Schatz at the hospital. Please advise.

## 2021-07-12 NOTE — Telephone Encounter (Signed)
Pt states he has been sick for the past 3 days and there is no way he can do the prep for the procedure tomorrow. Requests to reschedule the procedure. Let him know he will be put on the list to reschedule. Appt cancelled for Endo/colon. Dr. Candis Schatz notified.   Maya can you add him back to the wait list?

## 2021-07-13 ENCOUNTER — Ambulatory Visit (HOSPITAL_COMMUNITY): Admission: RE | Admit: 2021-07-13 | Payer: Medicare Other | Source: Ambulatory Visit | Admitting: Gastroenterology

## 2021-07-13 ENCOUNTER — Encounter (HOSPITAL_COMMUNITY): Admission: RE | Payer: Self-pay | Source: Ambulatory Visit

## 2021-07-13 SURGERY — COLONOSCOPY WITH PROPOFOL
Anesthesia: Monitor Anesthesia Care

## 2021-07-14 ENCOUNTER — Ambulatory Visit: Payer: Medicare Other | Admitting: Rehabilitative and Restorative Service Providers"

## 2021-07-14 DIAGNOSIS — N186 End stage renal disease: Secondary | ICD-10-CM | POA: Diagnosis not present

## 2021-07-14 DIAGNOSIS — R519 Headache, unspecified: Secondary | ICD-10-CM | POA: Diagnosis not present

## 2021-07-14 DIAGNOSIS — E875 Hyperkalemia: Secondary | ICD-10-CM | POA: Diagnosis not present

## 2021-07-14 DIAGNOSIS — Z992 Dependence on renal dialysis: Secondary | ICD-10-CM | POA: Diagnosis not present

## 2021-07-14 DIAGNOSIS — D631 Anemia in chronic kidney disease: Secondary | ICD-10-CM | POA: Diagnosis not present

## 2021-07-14 DIAGNOSIS — N2581 Secondary hyperparathyroidism of renal origin: Secondary | ICD-10-CM | POA: Diagnosis not present

## 2021-07-14 DIAGNOSIS — D509 Iron deficiency anemia, unspecified: Secondary | ICD-10-CM | POA: Diagnosis not present

## 2021-07-14 DIAGNOSIS — Z7682 Awaiting organ transplant status: Secondary | ICD-10-CM | POA: Diagnosis not present

## 2021-07-14 DIAGNOSIS — D689 Coagulation defect, unspecified: Secondary | ICD-10-CM | POA: Diagnosis not present

## 2021-07-17 DIAGNOSIS — D509 Iron deficiency anemia, unspecified: Secondary | ICD-10-CM | POA: Diagnosis not present

## 2021-07-17 DIAGNOSIS — N2581 Secondary hyperparathyroidism of renal origin: Secondary | ICD-10-CM | POA: Diagnosis not present

## 2021-07-17 DIAGNOSIS — D631 Anemia in chronic kidney disease: Secondary | ICD-10-CM | POA: Diagnosis not present

## 2021-07-17 DIAGNOSIS — Z992 Dependence on renal dialysis: Secondary | ICD-10-CM | POA: Diagnosis not present

## 2021-07-17 DIAGNOSIS — N186 End stage renal disease: Secondary | ICD-10-CM | POA: Diagnosis not present

## 2021-07-17 DIAGNOSIS — Z7682 Awaiting organ transplant status: Secondary | ICD-10-CM | POA: Diagnosis not present

## 2021-07-17 DIAGNOSIS — E875 Hyperkalemia: Secondary | ICD-10-CM | POA: Diagnosis not present

## 2021-07-17 DIAGNOSIS — D689 Coagulation defect, unspecified: Secondary | ICD-10-CM | POA: Diagnosis not present

## 2021-07-17 DIAGNOSIS — R519 Headache, unspecified: Secondary | ICD-10-CM | POA: Diagnosis not present

## 2021-07-19 DIAGNOSIS — D509 Iron deficiency anemia, unspecified: Secondary | ICD-10-CM | POA: Diagnosis not present

## 2021-07-19 DIAGNOSIS — D631 Anemia in chronic kidney disease: Secondary | ICD-10-CM | POA: Diagnosis not present

## 2021-07-19 DIAGNOSIS — Z7682 Awaiting organ transplant status: Secondary | ICD-10-CM | POA: Diagnosis not present

## 2021-07-19 DIAGNOSIS — N186 End stage renal disease: Secondary | ICD-10-CM | POA: Diagnosis not present

## 2021-07-19 DIAGNOSIS — D689 Coagulation defect, unspecified: Secondary | ICD-10-CM | POA: Diagnosis not present

## 2021-07-19 DIAGNOSIS — N2581 Secondary hyperparathyroidism of renal origin: Secondary | ICD-10-CM | POA: Diagnosis not present

## 2021-07-19 DIAGNOSIS — R519 Headache, unspecified: Secondary | ICD-10-CM | POA: Diagnosis not present

## 2021-07-19 DIAGNOSIS — Z992 Dependence on renal dialysis: Secondary | ICD-10-CM | POA: Diagnosis not present

## 2021-07-19 DIAGNOSIS — E875 Hyperkalemia: Secondary | ICD-10-CM | POA: Diagnosis not present

## 2021-07-21 DIAGNOSIS — D689 Coagulation defect, unspecified: Secondary | ICD-10-CM | POA: Diagnosis not present

## 2021-07-21 DIAGNOSIS — Z992 Dependence on renal dialysis: Secondary | ICD-10-CM | POA: Diagnosis not present

## 2021-07-21 DIAGNOSIS — Z7682 Awaiting organ transplant status: Secondary | ICD-10-CM | POA: Diagnosis not present

## 2021-07-21 DIAGNOSIS — N2581 Secondary hyperparathyroidism of renal origin: Secondary | ICD-10-CM | POA: Diagnosis not present

## 2021-07-21 DIAGNOSIS — N186 End stage renal disease: Secondary | ICD-10-CM | POA: Diagnosis not present

## 2021-07-21 DIAGNOSIS — D509 Iron deficiency anemia, unspecified: Secondary | ICD-10-CM | POA: Diagnosis not present

## 2021-07-21 DIAGNOSIS — R519 Headache, unspecified: Secondary | ICD-10-CM | POA: Diagnosis not present

## 2021-07-21 DIAGNOSIS — D631 Anemia in chronic kidney disease: Secondary | ICD-10-CM | POA: Diagnosis not present

## 2021-07-21 DIAGNOSIS — E875 Hyperkalemia: Secondary | ICD-10-CM | POA: Diagnosis not present

## 2021-07-24 DIAGNOSIS — Z7682 Awaiting organ transplant status: Secondary | ICD-10-CM | POA: Diagnosis not present

## 2021-07-24 DIAGNOSIS — D631 Anemia in chronic kidney disease: Secondary | ICD-10-CM | POA: Diagnosis not present

## 2021-07-24 DIAGNOSIS — E875 Hyperkalemia: Secondary | ICD-10-CM | POA: Diagnosis not present

## 2021-07-24 DIAGNOSIS — R519 Headache, unspecified: Secondary | ICD-10-CM | POA: Diagnosis not present

## 2021-07-24 DIAGNOSIS — N186 End stage renal disease: Secondary | ICD-10-CM | POA: Diagnosis not present

## 2021-07-24 DIAGNOSIS — N2581 Secondary hyperparathyroidism of renal origin: Secondary | ICD-10-CM | POA: Diagnosis not present

## 2021-07-24 DIAGNOSIS — D509 Iron deficiency anemia, unspecified: Secondary | ICD-10-CM | POA: Diagnosis not present

## 2021-07-24 DIAGNOSIS — D689 Coagulation defect, unspecified: Secondary | ICD-10-CM | POA: Diagnosis not present

## 2021-07-24 DIAGNOSIS — Z992 Dependence on renal dialysis: Secondary | ICD-10-CM | POA: Diagnosis not present

## 2021-07-25 DIAGNOSIS — E875 Hyperkalemia: Secondary | ICD-10-CM | POA: Diagnosis not present

## 2021-07-25 DIAGNOSIS — Z7682 Awaiting organ transplant status: Secondary | ICD-10-CM | POA: Diagnosis not present

## 2021-07-25 DIAGNOSIS — N186 End stage renal disease: Secondary | ICD-10-CM | POA: Diagnosis not present

## 2021-07-25 DIAGNOSIS — N2581 Secondary hyperparathyroidism of renal origin: Secondary | ICD-10-CM | POA: Diagnosis not present

## 2021-07-25 DIAGNOSIS — D689 Coagulation defect, unspecified: Secondary | ICD-10-CM | POA: Diagnosis not present

## 2021-07-25 DIAGNOSIS — D509 Iron deficiency anemia, unspecified: Secondary | ICD-10-CM | POA: Diagnosis not present

## 2021-07-25 DIAGNOSIS — Z992 Dependence on renal dialysis: Secondary | ICD-10-CM | POA: Diagnosis not present

## 2021-07-25 DIAGNOSIS — D631 Anemia in chronic kidney disease: Secondary | ICD-10-CM | POA: Diagnosis not present

## 2021-07-25 DIAGNOSIS — R519 Headache, unspecified: Secondary | ICD-10-CM | POA: Diagnosis not present

## 2021-07-28 DIAGNOSIS — R519 Headache, unspecified: Secondary | ICD-10-CM | POA: Diagnosis not present

## 2021-07-28 DIAGNOSIS — D631 Anemia in chronic kidney disease: Secondary | ICD-10-CM | POA: Diagnosis not present

## 2021-07-28 DIAGNOSIS — Z992 Dependence on renal dialysis: Secondary | ICD-10-CM | POA: Diagnosis not present

## 2021-07-28 DIAGNOSIS — E875 Hyperkalemia: Secondary | ICD-10-CM | POA: Diagnosis not present

## 2021-07-28 DIAGNOSIS — D689 Coagulation defect, unspecified: Secondary | ICD-10-CM | POA: Diagnosis not present

## 2021-07-28 DIAGNOSIS — N186 End stage renal disease: Secondary | ICD-10-CM | POA: Diagnosis not present

## 2021-07-28 DIAGNOSIS — N2581 Secondary hyperparathyroidism of renal origin: Secondary | ICD-10-CM | POA: Diagnosis not present

## 2021-07-28 DIAGNOSIS — Z7682 Awaiting organ transplant status: Secondary | ICD-10-CM | POA: Diagnosis not present

## 2021-07-28 DIAGNOSIS — D509 Iron deficiency anemia, unspecified: Secondary | ICD-10-CM | POA: Diagnosis not present

## 2021-07-31 DIAGNOSIS — Z7682 Awaiting organ transplant status: Secondary | ICD-10-CM | POA: Diagnosis not present

## 2021-07-31 DIAGNOSIS — N186 End stage renal disease: Secondary | ICD-10-CM | POA: Diagnosis not present

## 2021-07-31 DIAGNOSIS — D631 Anemia in chronic kidney disease: Secondary | ICD-10-CM | POA: Diagnosis not present

## 2021-07-31 DIAGNOSIS — Z992 Dependence on renal dialysis: Secondary | ICD-10-CM | POA: Diagnosis not present

## 2021-07-31 DIAGNOSIS — E875 Hyperkalemia: Secondary | ICD-10-CM | POA: Diagnosis not present

## 2021-07-31 DIAGNOSIS — D509 Iron deficiency anemia, unspecified: Secondary | ICD-10-CM | POA: Diagnosis not present

## 2021-07-31 DIAGNOSIS — D689 Coagulation defect, unspecified: Secondary | ICD-10-CM | POA: Diagnosis not present

## 2021-07-31 DIAGNOSIS — R519 Headache, unspecified: Secondary | ICD-10-CM | POA: Diagnosis not present

## 2021-07-31 DIAGNOSIS — N2581 Secondary hyperparathyroidism of renal origin: Secondary | ICD-10-CM | POA: Diagnosis not present

## 2021-08-02 DIAGNOSIS — E875 Hyperkalemia: Secondary | ICD-10-CM | POA: Diagnosis not present

## 2021-08-02 DIAGNOSIS — D689 Coagulation defect, unspecified: Secondary | ICD-10-CM | POA: Diagnosis not present

## 2021-08-02 DIAGNOSIS — N186 End stage renal disease: Secondary | ICD-10-CM | POA: Diagnosis not present

## 2021-08-02 DIAGNOSIS — R519 Headache, unspecified: Secondary | ICD-10-CM | POA: Diagnosis not present

## 2021-08-02 DIAGNOSIS — N2581 Secondary hyperparathyroidism of renal origin: Secondary | ICD-10-CM | POA: Diagnosis not present

## 2021-08-02 DIAGNOSIS — D509 Iron deficiency anemia, unspecified: Secondary | ICD-10-CM | POA: Diagnosis not present

## 2021-08-02 DIAGNOSIS — D631 Anemia in chronic kidney disease: Secondary | ICD-10-CM | POA: Diagnosis not present

## 2021-08-02 DIAGNOSIS — Z7682 Awaiting organ transplant status: Secondary | ICD-10-CM | POA: Diagnosis not present

## 2021-08-02 DIAGNOSIS — Z992 Dependence on renal dialysis: Secondary | ICD-10-CM | POA: Diagnosis not present

## 2021-08-04 DIAGNOSIS — N186 End stage renal disease: Secondary | ICD-10-CM | POA: Diagnosis not present

## 2021-08-04 DIAGNOSIS — D509 Iron deficiency anemia, unspecified: Secondary | ICD-10-CM | POA: Diagnosis not present

## 2021-08-04 DIAGNOSIS — E875 Hyperkalemia: Secondary | ICD-10-CM | POA: Diagnosis not present

## 2021-08-04 DIAGNOSIS — R519 Headache, unspecified: Secondary | ICD-10-CM | POA: Diagnosis not present

## 2021-08-04 DIAGNOSIS — Z7682 Awaiting organ transplant status: Secondary | ICD-10-CM | POA: Diagnosis not present

## 2021-08-04 DIAGNOSIS — Z992 Dependence on renal dialysis: Secondary | ICD-10-CM | POA: Diagnosis not present

## 2021-08-04 DIAGNOSIS — D689 Coagulation defect, unspecified: Secondary | ICD-10-CM | POA: Diagnosis not present

## 2021-08-04 DIAGNOSIS — N2581 Secondary hyperparathyroidism of renal origin: Secondary | ICD-10-CM | POA: Diagnosis not present

## 2021-08-04 DIAGNOSIS — D631 Anemia in chronic kidney disease: Secondary | ICD-10-CM | POA: Diagnosis not present

## 2021-08-07 DIAGNOSIS — E875 Hyperkalemia: Secondary | ICD-10-CM | POA: Diagnosis not present

## 2021-08-07 DIAGNOSIS — N2581 Secondary hyperparathyroidism of renal origin: Secondary | ICD-10-CM | POA: Diagnosis not present

## 2021-08-07 DIAGNOSIS — D631 Anemia in chronic kidney disease: Secondary | ICD-10-CM | POA: Diagnosis not present

## 2021-08-07 DIAGNOSIS — Z992 Dependence on renal dialysis: Secondary | ICD-10-CM | POA: Diagnosis not present

## 2021-08-07 DIAGNOSIS — D689 Coagulation defect, unspecified: Secondary | ICD-10-CM | POA: Diagnosis not present

## 2021-08-07 DIAGNOSIS — D509 Iron deficiency anemia, unspecified: Secondary | ICD-10-CM | POA: Diagnosis not present

## 2021-08-07 DIAGNOSIS — Z7682 Awaiting organ transplant status: Secondary | ICD-10-CM | POA: Diagnosis not present

## 2021-08-07 DIAGNOSIS — R519 Headache, unspecified: Secondary | ICD-10-CM | POA: Diagnosis not present

## 2021-08-07 DIAGNOSIS — N186 End stage renal disease: Secondary | ICD-10-CM | POA: Diagnosis not present

## 2021-08-09 DIAGNOSIS — E875 Hyperkalemia: Secondary | ICD-10-CM | POA: Diagnosis not present

## 2021-08-09 DIAGNOSIS — D509 Iron deficiency anemia, unspecified: Secondary | ICD-10-CM | POA: Diagnosis not present

## 2021-08-09 DIAGNOSIS — Z992 Dependence on renal dialysis: Secondary | ICD-10-CM | POA: Diagnosis not present

## 2021-08-09 DIAGNOSIS — R519 Headache, unspecified: Secondary | ICD-10-CM | POA: Diagnosis not present

## 2021-08-09 DIAGNOSIS — D631 Anemia in chronic kidney disease: Secondary | ICD-10-CM | POA: Diagnosis not present

## 2021-08-09 DIAGNOSIS — N2581 Secondary hyperparathyroidism of renal origin: Secondary | ICD-10-CM | POA: Diagnosis not present

## 2021-08-09 DIAGNOSIS — N186 End stage renal disease: Secondary | ICD-10-CM | POA: Diagnosis not present

## 2021-08-09 DIAGNOSIS — Z7682 Awaiting organ transplant status: Secondary | ICD-10-CM | POA: Diagnosis not present

## 2021-08-09 DIAGNOSIS — D689 Coagulation defect, unspecified: Secondary | ICD-10-CM | POA: Diagnosis not present

## 2021-08-11 DIAGNOSIS — R519 Headache, unspecified: Secondary | ICD-10-CM | POA: Diagnosis not present

## 2021-08-11 DIAGNOSIS — D631 Anemia in chronic kidney disease: Secondary | ICD-10-CM | POA: Diagnosis not present

## 2021-08-11 DIAGNOSIS — E875 Hyperkalemia: Secondary | ICD-10-CM | POA: Diagnosis not present

## 2021-08-11 DIAGNOSIS — I12 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 stage renal disease: Secondary | ICD-10-CM | POA: Diagnosis not present

## 2021-08-11 DIAGNOSIS — D509 Iron deficiency anemia, unspecified: Secondary | ICD-10-CM | POA: Diagnosis not present

## 2021-08-11 DIAGNOSIS — D689 Coagulation defect, unspecified: Secondary | ICD-10-CM | POA: Diagnosis not present

## 2021-08-11 DIAGNOSIS — Z992 Dependence on renal dialysis: Secondary | ICD-10-CM | POA: Diagnosis not present

## 2021-08-11 DIAGNOSIS — N186 End stage renal disease: Secondary | ICD-10-CM | POA: Diagnosis not present

## 2021-08-11 DIAGNOSIS — N2581 Secondary hyperparathyroidism of renal origin: Secondary | ICD-10-CM | POA: Diagnosis not present

## 2021-08-11 DIAGNOSIS — Z7682 Awaiting organ transplant status: Secondary | ICD-10-CM | POA: Diagnosis not present

## 2021-08-14 DIAGNOSIS — N186 End stage renal disease: Secondary | ICD-10-CM | POA: Diagnosis not present

## 2021-08-14 DIAGNOSIS — N2581 Secondary hyperparathyroidism of renal origin: Secondary | ICD-10-CM | POA: Diagnosis not present

## 2021-08-14 DIAGNOSIS — D509 Iron deficiency anemia, unspecified: Secondary | ICD-10-CM | POA: Diagnosis not present

## 2021-08-14 DIAGNOSIS — D631 Anemia in chronic kidney disease: Secondary | ICD-10-CM | POA: Diagnosis not present

## 2021-08-14 DIAGNOSIS — Z992 Dependence on renal dialysis: Secondary | ICD-10-CM | POA: Diagnosis not present

## 2021-08-14 DIAGNOSIS — R519 Headache, unspecified: Secondary | ICD-10-CM | POA: Diagnosis not present

## 2021-08-14 DIAGNOSIS — Z7682 Awaiting organ transplant status: Secondary | ICD-10-CM | POA: Diagnosis not present

## 2021-08-14 DIAGNOSIS — E875 Hyperkalemia: Secondary | ICD-10-CM | POA: Diagnosis not present

## 2021-08-14 DIAGNOSIS — E1129 Type 2 diabetes mellitus with other diabetic kidney complication: Secondary | ICD-10-CM | POA: Diagnosis not present

## 2021-08-14 DIAGNOSIS — D689 Coagulation defect, unspecified: Secondary | ICD-10-CM | POA: Diagnosis not present

## 2021-08-16 DIAGNOSIS — E1129 Type 2 diabetes mellitus with other diabetic kidney complication: Secondary | ICD-10-CM | POA: Diagnosis not present

## 2021-08-16 DIAGNOSIS — Z7682 Awaiting organ transplant status: Secondary | ICD-10-CM | POA: Diagnosis not present

## 2021-08-16 DIAGNOSIS — E875 Hyperkalemia: Secondary | ICD-10-CM | POA: Diagnosis not present

## 2021-08-16 DIAGNOSIS — D689 Coagulation defect, unspecified: Secondary | ICD-10-CM | POA: Diagnosis not present

## 2021-08-16 DIAGNOSIS — N2581 Secondary hyperparathyroidism of renal origin: Secondary | ICD-10-CM | POA: Diagnosis not present

## 2021-08-16 DIAGNOSIS — D509 Iron deficiency anemia, unspecified: Secondary | ICD-10-CM | POA: Diagnosis not present

## 2021-08-16 DIAGNOSIS — N186 End stage renal disease: Secondary | ICD-10-CM | POA: Diagnosis not present

## 2021-08-16 DIAGNOSIS — D631 Anemia in chronic kidney disease: Secondary | ICD-10-CM | POA: Diagnosis not present

## 2021-08-16 DIAGNOSIS — Z992 Dependence on renal dialysis: Secondary | ICD-10-CM | POA: Diagnosis not present

## 2021-08-16 DIAGNOSIS — R519 Headache, unspecified: Secondary | ICD-10-CM | POA: Diagnosis not present

## 2021-08-18 DIAGNOSIS — N2581 Secondary hyperparathyroidism of renal origin: Secondary | ICD-10-CM | POA: Diagnosis not present

## 2021-08-18 DIAGNOSIS — E875 Hyperkalemia: Secondary | ICD-10-CM | POA: Diagnosis not present

## 2021-08-18 DIAGNOSIS — D509 Iron deficiency anemia, unspecified: Secondary | ICD-10-CM | POA: Diagnosis not present

## 2021-08-18 DIAGNOSIS — Z7682 Awaiting organ transplant status: Secondary | ICD-10-CM | POA: Diagnosis not present

## 2021-08-18 DIAGNOSIS — D689 Coagulation defect, unspecified: Secondary | ICD-10-CM | POA: Diagnosis not present

## 2021-08-18 DIAGNOSIS — N186 End stage renal disease: Secondary | ICD-10-CM | POA: Diagnosis not present

## 2021-08-18 DIAGNOSIS — R519 Headache, unspecified: Secondary | ICD-10-CM | POA: Diagnosis not present

## 2021-08-18 DIAGNOSIS — Z992 Dependence on renal dialysis: Secondary | ICD-10-CM | POA: Diagnosis not present

## 2021-08-18 DIAGNOSIS — E1129 Type 2 diabetes mellitus with other diabetic kidney complication: Secondary | ICD-10-CM | POA: Diagnosis not present

## 2021-08-18 DIAGNOSIS — D631 Anemia in chronic kidney disease: Secondary | ICD-10-CM | POA: Diagnosis not present

## 2021-08-21 DIAGNOSIS — R519 Headache, unspecified: Secondary | ICD-10-CM | POA: Diagnosis not present

## 2021-08-21 DIAGNOSIS — Z992 Dependence on renal dialysis: Secondary | ICD-10-CM | POA: Diagnosis not present

## 2021-08-21 DIAGNOSIS — D509 Iron deficiency anemia, unspecified: Secondary | ICD-10-CM | POA: Diagnosis not present

## 2021-08-21 DIAGNOSIS — E1129 Type 2 diabetes mellitus with other diabetic kidney complication: Secondary | ICD-10-CM | POA: Diagnosis not present

## 2021-08-21 DIAGNOSIS — N186 End stage renal disease: Secondary | ICD-10-CM | POA: Diagnosis not present

## 2021-08-21 DIAGNOSIS — E875 Hyperkalemia: Secondary | ICD-10-CM | POA: Diagnosis not present

## 2021-08-21 DIAGNOSIS — D689 Coagulation defect, unspecified: Secondary | ICD-10-CM | POA: Diagnosis not present

## 2021-08-21 DIAGNOSIS — N2581 Secondary hyperparathyroidism of renal origin: Secondary | ICD-10-CM | POA: Diagnosis not present

## 2021-08-21 DIAGNOSIS — Z7682 Awaiting organ transplant status: Secondary | ICD-10-CM | POA: Diagnosis not present

## 2021-08-21 DIAGNOSIS — D631 Anemia in chronic kidney disease: Secondary | ICD-10-CM | POA: Diagnosis not present

## 2021-08-22 DIAGNOSIS — Z992 Dependence on renal dialysis: Secondary | ICD-10-CM | POA: Diagnosis not present

## 2021-08-22 DIAGNOSIS — D509 Iron deficiency anemia, unspecified: Secondary | ICD-10-CM | POA: Diagnosis not present

## 2021-08-22 DIAGNOSIS — D689 Coagulation defect, unspecified: Secondary | ICD-10-CM | POA: Diagnosis not present

## 2021-08-22 DIAGNOSIS — Z7682 Awaiting organ transplant status: Secondary | ICD-10-CM | POA: Diagnosis not present

## 2021-08-22 DIAGNOSIS — E1129 Type 2 diabetes mellitus with other diabetic kidney complication: Secondary | ICD-10-CM | POA: Diagnosis not present

## 2021-08-22 DIAGNOSIS — E875 Hyperkalemia: Secondary | ICD-10-CM | POA: Diagnosis not present

## 2021-08-22 DIAGNOSIS — D631 Anemia in chronic kidney disease: Secondary | ICD-10-CM | POA: Diagnosis not present

## 2021-08-22 DIAGNOSIS — R519 Headache, unspecified: Secondary | ICD-10-CM | POA: Diagnosis not present

## 2021-08-22 DIAGNOSIS — N2581 Secondary hyperparathyroidism of renal origin: Secondary | ICD-10-CM | POA: Diagnosis not present

## 2021-08-22 DIAGNOSIS — N186 End stage renal disease: Secondary | ICD-10-CM | POA: Diagnosis not present

## 2021-08-23 DIAGNOSIS — E1129 Type 2 diabetes mellitus with other diabetic kidney complication: Secondary | ICD-10-CM | POA: Diagnosis not present

## 2021-08-23 DIAGNOSIS — E875 Hyperkalemia: Secondary | ICD-10-CM | POA: Diagnosis not present

## 2021-08-23 DIAGNOSIS — R519 Headache, unspecified: Secondary | ICD-10-CM | POA: Diagnosis not present

## 2021-08-23 DIAGNOSIS — D509 Iron deficiency anemia, unspecified: Secondary | ICD-10-CM | POA: Diagnosis not present

## 2021-08-23 DIAGNOSIS — Z992 Dependence on renal dialysis: Secondary | ICD-10-CM | POA: Diagnosis not present

## 2021-08-23 DIAGNOSIS — D631 Anemia in chronic kidney disease: Secondary | ICD-10-CM | POA: Diagnosis not present

## 2021-08-23 DIAGNOSIS — N2581 Secondary hyperparathyroidism of renal origin: Secondary | ICD-10-CM | POA: Diagnosis not present

## 2021-08-23 DIAGNOSIS — D689 Coagulation defect, unspecified: Secondary | ICD-10-CM | POA: Diagnosis not present

## 2021-08-23 DIAGNOSIS — N186 End stage renal disease: Secondary | ICD-10-CM | POA: Diagnosis not present

## 2021-08-23 DIAGNOSIS — Z7682 Awaiting organ transplant status: Secondary | ICD-10-CM | POA: Diagnosis not present

## 2021-08-25 DIAGNOSIS — N186 End stage renal disease: Secondary | ICD-10-CM | POA: Diagnosis not present

## 2021-08-25 DIAGNOSIS — Z992 Dependence on renal dialysis: Secondary | ICD-10-CM | POA: Diagnosis not present

## 2021-08-25 DIAGNOSIS — E875 Hyperkalemia: Secondary | ICD-10-CM | POA: Diagnosis not present

## 2021-08-25 DIAGNOSIS — N2581 Secondary hyperparathyroidism of renal origin: Secondary | ICD-10-CM | POA: Diagnosis not present

## 2021-08-25 DIAGNOSIS — R519 Headache, unspecified: Secondary | ICD-10-CM | POA: Diagnosis not present

## 2021-08-25 DIAGNOSIS — D509 Iron deficiency anemia, unspecified: Secondary | ICD-10-CM | POA: Diagnosis not present

## 2021-08-25 DIAGNOSIS — D689 Coagulation defect, unspecified: Secondary | ICD-10-CM | POA: Diagnosis not present

## 2021-08-25 DIAGNOSIS — Z7682 Awaiting organ transplant status: Secondary | ICD-10-CM | POA: Diagnosis not present

## 2021-08-25 DIAGNOSIS — E1129 Type 2 diabetes mellitus with other diabetic kidney complication: Secondary | ICD-10-CM | POA: Diagnosis not present

## 2021-08-25 DIAGNOSIS — D631 Anemia in chronic kidney disease: Secondary | ICD-10-CM | POA: Diagnosis not present

## 2021-08-28 DIAGNOSIS — N2581 Secondary hyperparathyroidism of renal origin: Secondary | ICD-10-CM | POA: Diagnosis not present

## 2021-08-28 DIAGNOSIS — R519 Headache, unspecified: Secondary | ICD-10-CM | POA: Diagnosis not present

## 2021-08-28 DIAGNOSIS — D509 Iron deficiency anemia, unspecified: Secondary | ICD-10-CM | POA: Diagnosis not present

## 2021-08-28 DIAGNOSIS — E875 Hyperkalemia: Secondary | ICD-10-CM | POA: Diagnosis not present

## 2021-08-28 DIAGNOSIS — Z7682 Awaiting organ transplant status: Secondary | ICD-10-CM | POA: Diagnosis not present

## 2021-08-28 DIAGNOSIS — Z992 Dependence on renal dialysis: Secondary | ICD-10-CM | POA: Diagnosis not present

## 2021-08-28 DIAGNOSIS — D689 Coagulation defect, unspecified: Secondary | ICD-10-CM | POA: Diagnosis not present

## 2021-08-28 DIAGNOSIS — D631 Anemia in chronic kidney disease: Secondary | ICD-10-CM | POA: Diagnosis not present

## 2021-08-28 DIAGNOSIS — E1129 Type 2 diabetes mellitus with other diabetic kidney complication: Secondary | ICD-10-CM | POA: Diagnosis not present

## 2021-08-28 DIAGNOSIS — N186 End stage renal disease: Secondary | ICD-10-CM | POA: Diagnosis not present

## 2021-08-30 DIAGNOSIS — E875 Hyperkalemia: Secondary | ICD-10-CM | POA: Diagnosis not present

## 2021-08-30 DIAGNOSIS — Z7682 Awaiting organ transplant status: Secondary | ICD-10-CM | POA: Diagnosis not present

## 2021-08-30 DIAGNOSIS — E1129 Type 2 diabetes mellitus with other diabetic kidney complication: Secondary | ICD-10-CM | POA: Diagnosis not present

## 2021-08-30 DIAGNOSIS — D631 Anemia in chronic kidney disease: Secondary | ICD-10-CM | POA: Diagnosis not present

## 2021-08-30 DIAGNOSIS — N2581 Secondary hyperparathyroidism of renal origin: Secondary | ICD-10-CM | POA: Diagnosis not present

## 2021-08-30 DIAGNOSIS — D509 Iron deficiency anemia, unspecified: Secondary | ICD-10-CM | POA: Diagnosis not present

## 2021-08-30 DIAGNOSIS — N186 End stage renal disease: Secondary | ICD-10-CM | POA: Diagnosis not present

## 2021-08-30 DIAGNOSIS — D689 Coagulation defect, unspecified: Secondary | ICD-10-CM | POA: Diagnosis not present

## 2021-08-30 DIAGNOSIS — R519 Headache, unspecified: Secondary | ICD-10-CM | POA: Diagnosis not present

## 2021-08-30 DIAGNOSIS — Z992 Dependence on renal dialysis: Secondary | ICD-10-CM | POA: Diagnosis not present

## 2021-09-01 DIAGNOSIS — N186 End stage renal disease: Secondary | ICD-10-CM | POA: Diagnosis not present

## 2021-09-01 DIAGNOSIS — D631 Anemia in chronic kidney disease: Secondary | ICD-10-CM | POA: Diagnosis not present

## 2021-09-01 DIAGNOSIS — N2581 Secondary hyperparathyroidism of renal origin: Secondary | ICD-10-CM | POA: Diagnosis not present

## 2021-09-01 DIAGNOSIS — R519 Headache, unspecified: Secondary | ICD-10-CM | POA: Diagnosis not present

## 2021-09-01 DIAGNOSIS — D509 Iron deficiency anemia, unspecified: Secondary | ICD-10-CM | POA: Diagnosis not present

## 2021-09-01 DIAGNOSIS — E875 Hyperkalemia: Secondary | ICD-10-CM | POA: Diagnosis not present

## 2021-09-01 DIAGNOSIS — E1129 Type 2 diabetes mellitus with other diabetic kidney complication: Secondary | ICD-10-CM | POA: Diagnosis not present

## 2021-09-01 DIAGNOSIS — Z7682 Awaiting organ transplant status: Secondary | ICD-10-CM | POA: Diagnosis not present

## 2021-09-01 DIAGNOSIS — D689 Coagulation defect, unspecified: Secondary | ICD-10-CM | POA: Diagnosis not present

## 2021-09-01 DIAGNOSIS — Z992 Dependence on renal dialysis: Secondary | ICD-10-CM | POA: Diagnosis not present

## 2021-09-04 DIAGNOSIS — E1129 Type 2 diabetes mellitus with other diabetic kidney complication: Secondary | ICD-10-CM | POA: Diagnosis not present

## 2021-09-04 DIAGNOSIS — N2581 Secondary hyperparathyroidism of renal origin: Secondary | ICD-10-CM | POA: Diagnosis not present

## 2021-09-04 DIAGNOSIS — E875 Hyperkalemia: Secondary | ICD-10-CM | POA: Diagnosis not present

## 2021-09-04 DIAGNOSIS — Z7682 Awaiting organ transplant status: Secondary | ICD-10-CM | POA: Diagnosis not present

## 2021-09-04 DIAGNOSIS — N186 End stage renal disease: Secondary | ICD-10-CM | POA: Diagnosis not present

## 2021-09-04 DIAGNOSIS — Z992 Dependence on renal dialysis: Secondary | ICD-10-CM | POA: Diagnosis not present

## 2021-09-04 DIAGNOSIS — D509 Iron deficiency anemia, unspecified: Secondary | ICD-10-CM | POA: Diagnosis not present

## 2021-09-04 DIAGNOSIS — R519 Headache, unspecified: Secondary | ICD-10-CM | POA: Diagnosis not present

## 2021-09-04 DIAGNOSIS — D631 Anemia in chronic kidney disease: Secondary | ICD-10-CM | POA: Diagnosis not present

## 2021-09-04 DIAGNOSIS — D689 Coagulation defect, unspecified: Secondary | ICD-10-CM | POA: Diagnosis not present

## 2021-09-06 DIAGNOSIS — N186 End stage renal disease: Secondary | ICD-10-CM | POA: Diagnosis not present

## 2021-09-06 DIAGNOSIS — E1129 Type 2 diabetes mellitus with other diabetic kidney complication: Secondary | ICD-10-CM | POA: Diagnosis not present

## 2021-09-06 DIAGNOSIS — D509 Iron deficiency anemia, unspecified: Secondary | ICD-10-CM | POA: Diagnosis not present

## 2021-09-06 DIAGNOSIS — N2581 Secondary hyperparathyroidism of renal origin: Secondary | ICD-10-CM | POA: Diagnosis not present

## 2021-09-06 DIAGNOSIS — E875 Hyperkalemia: Secondary | ICD-10-CM | POA: Diagnosis not present

## 2021-09-06 DIAGNOSIS — D631 Anemia in chronic kidney disease: Secondary | ICD-10-CM | POA: Diagnosis not present

## 2021-09-06 DIAGNOSIS — Z7682 Awaiting organ transplant status: Secondary | ICD-10-CM | POA: Diagnosis not present

## 2021-09-06 DIAGNOSIS — D689 Coagulation defect, unspecified: Secondary | ICD-10-CM | POA: Diagnosis not present

## 2021-09-06 DIAGNOSIS — R519 Headache, unspecified: Secondary | ICD-10-CM | POA: Diagnosis not present

## 2021-09-06 DIAGNOSIS — Z992 Dependence on renal dialysis: Secondary | ICD-10-CM | POA: Diagnosis not present

## 2021-09-08 DIAGNOSIS — D689 Coagulation defect, unspecified: Secondary | ICD-10-CM | POA: Diagnosis not present

## 2021-09-08 DIAGNOSIS — E875 Hyperkalemia: Secondary | ICD-10-CM | POA: Diagnosis not present

## 2021-09-08 DIAGNOSIS — D631 Anemia in chronic kidney disease: Secondary | ICD-10-CM | POA: Diagnosis not present

## 2021-09-08 DIAGNOSIS — Z992 Dependence on renal dialysis: Secondary | ICD-10-CM | POA: Diagnosis not present

## 2021-09-08 DIAGNOSIS — D509 Iron deficiency anemia, unspecified: Secondary | ICD-10-CM | POA: Diagnosis not present

## 2021-09-08 DIAGNOSIS — E1129 Type 2 diabetes mellitus with other diabetic kidney complication: Secondary | ICD-10-CM | POA: Diagnosis not present

## 2021-09-08 DIAGNOSIS — Z7682 Awaiting organ transplant status: Secondary | ICD-10-CM | POA: Diagnosis not present

## 2021-09-08 DIAGNOSIS — N186 End stage renal disease: Secondary | ICD-10-CM | POA: Diagnosis not present

## 2021-09-08 DIAGNOSIS — R519 Headache, unspecified: Secondary | ICD-10-CM | POA: Diagnosis not present

## 2021-09-08 DIAGNOSIS — N2581 Secondary hyperparathyroidism of renal origin: Secondary | ICD-10-CM | POA: Diagnosis not present

## 2021-09-11 ENCOUNTER — Other Ambulatory Visit: Payer: Self-pay

## 2021-09-11 ENCOUNTER — Encounter (HOSPITAL_COMMUNITY): Payer: Self-pay

## 2021-09-11 ENCOUNTER — Observation Stay (HOSPITAL_COMMUNITY)
Admission: EM | Admit: 2021-09-11 | Discharge: 2021-09-13 | Disposition: A | Discharge status: Home / Self Care | Payer: Medicare Other | Attending: Internal Medicine | Admitting: Internal Medicine

## 2021-09-11 ENCOUNTER — Emergency Department (HOSPITAL_COMMUNITY): Payer: Medicare Other

## 2021-09-11 DIAGNOSIS — K2289 Other specified disease of esophagus: Secondary | ICD-10-CM | POA: Diagnosis not present

## 2021-09-11 DIAGNOSIS — N186 End stage renal disease: Secondary | ICD-10-CM | POA: Diagnosis not present

## 2021-09-11 DIAGNOSIS — Z96611 Presence of right artificial shoulder joint: Secondary | ICD-10-CM | POA: Insufficient documentation

## 2021-09-11 DIAGNOSIS — G4733 Obstructive sleep apnea (adult) (pediatric): Secondary | ICD-10-CM | POA: Diagnosis present

## 2021-09-11 DIAGNOSIS — K922 Gastrointestinal hemorrhage, unspecified: Secondary | ICD-10-CM | POA: Diagnosis present

## 2021-09-11 DIAGNOSIS — Z531 Procedure and treatment not carried out because of patient's decision for reasons of belief and group pressure: Secondary | ICD-10-CM | POA: Diagnosis not present

## 2021-09-11 DIAGNOSIS — K5730 Diverticulosis of large intestine without perforation or abscess without bleeding: Secondary | ICD-10-CM | POA: Diagnosis not present

## 2021-09-11 DIAGNOSIS — K259 Gastric ulcer, unspecified as acute or chronic, without hemorrhage or perforation: Secondary | ICD-10-CM | POA: Insufficient documentation

## 2021-09-11 DIAGNOSIS — K5790 Diverticulosis of intestine, part unspecified, without perforation or abscess without bleeding: Secondary | ICD-10-CM

## 2021-09-11 DIAGNOSIS — D62 Acute posthemorrhagic anemia: Secondary | ICD-10-CM | POA: Insufficient documentation

## 2021-09-11 DIAGNOSIS — K921 Melena: Secondary | ICD-10-CM | POA: Diagnosis present

## 2021-09-11 DIAGNOSIS — I10 Essential (primary) hypertension: Secondary | ICD-10-CM | POA: Diagnosis not present

## 2021-09-11 DIAGNOSIS — Z79899 Other long term (current) drug therapy: Secondary | ICD-10-CM | POA: Insufficient documentation

## 2021-09-11 DIAGNOSIS — K2970 Gastritis, unspecified, without bleeding: Secondary | ICD-10-CM | POA: Diagnosis not present

## 2021-09-11 DIAGNOSIS — K269 Duodenal ulcer, unspecified as acute or chronic, without hemorrhage or perforation: Secondary | ICD-10-CM | POA: Insufficient documentation

## 2021-09-11 DIAGNOSIS — K625 Hemorrhage of anus and rectum: Secondary | ICD-10-CM | POA: Diagnosis not present

## 2021-09-11 DIAGNOSIS — K2950 Unspecified chronic gastritis without bleeding: Principal | ICD-10-CM | POA: Insufficient documentation

## 2021-09-11 DIAGNOSIS — Z980 Intestinal bypass and anastomosis status: Secondary | ICD-10-CM | POA: Diagnosis not present

## 2021-09-11 DIAGNOSIS — Z87891 Personal history of nicotine dependence: Secondary | ICD-10-CM | POA: Insufficient documentation

## 2021-09-11 DIAGNOSIS — Z85528 Personal history of other malignant neoplasm of kidney: Secondary | ICD-10-CM | POA: Diagnosis not present

## 2021-09-11 DIAGNOSIS — K641 Second degree hemorrhoids: Secondary | ICD-10-CM | POA: Diagnosis not present

## 2021-09-11 DIAGNOSIS — Z992 Dependence on renal dialysis: Secondary | ICD-10-CM | POA: Diagnosis not present

## 2021-09-11 DIAGNOSIS — K559 Vascular disorder of intestine, unspecified: Secondary | ICD-10-CM | POA: Diagnosis not present

## 2021-09-11 DIAGNOSIS — IMO0001 Reserved for inherently not codable concepts without codable children: Secondary | ICD-10-CM

## 2021-09-11 DIAGNOSIS — I12 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 stage renal disease: Secondary | ICD-10-CM | POA: Diagnosis not present

## 2021-09-11 DIAGNOSIS — E785 Hyperlipidemia, unspecified: Secondary | ICD-10-CM | POA: Diagnosis present

## 2021-09-11 DIAGNOSIS — Z96642 Presence of left artificial hip joint: Secondary | ICD-10-CM | POA: Insufficient documentation

## 2021-09-11 DIAGNOSIS — K633 Ulcer of intestine: Secondary | ICD-10-CM | POA: Diagnosis not present

## 2021-09-11 DIAGNOSIS — Z8711 Personal history of peptic ulcer disease: Secondary | ICD-10-CM | POA: Diagnosis not present

## 2021-09-11 DIAGNOSIS — R519 Headache, unspecified: Secondary | ICD-10-CM | POA: Diagnosis present

## 2021-09-11 DIAGNOSIS — Z7982 Long term (current) use of aspirin: Secondary | ICD-10-CM | POA: Insufficient documentation

## 2021-09-11 DIAGNOSIS — K2980 Duodenitis without bleeding: Secondary | ICD-10-CM | POA: Insufficient documentation

## 2021-09-11 LAB — PROTIME-INR
INR: 1.1 (ref 0.8–1.2)
Prothrombin Time: 14.3 seconds (ref 11.4–15.2)

## 2021-09-11 LAB — CBC WITH DIFFERENTIAL/PLATELET
Abs Immature Granulocytes: 0.05 10*3/uL (ref 0.00–0.07)
Basophils Absolute: 0.1 10*3/uL (ref 0.0–0.1)
Basophils Relative: 1 %
Eosinophils Absolute: 0.3 10*3/uL (ref 0.0–0.5)
Eosinophils Relative: 4 %
HCT: 35 % — ABNORMAL LOW (ref 39.0–52.0)
Hemoglobin: 11 g/dL — ABNORMAL LOW (ref 13.0–17.0)
Immature Granulocytes: 1 %
Lymphocytes Relative: 12 %
Lymphs Abs: 1 10*3/uL (ref 0.7–4.0)
MCH: 28.6 pg (ref 26.0–34.0)
MCHC: 31.4 g/dL (ref 30.0–36.0)
MCV: 91.1 fL (ref 80.0–100.0)
Monocytes Absolute: 0.7 10*3/uL (ref 0.1–1.0)
Monocytes Relative: 9 %
Neutro Abs: 5.8 10*3/uL (ref 1.7–7.7)
Neutrophils Relative %: 73 %
Platelets: 301 10*3/uL (ref 150–400)
RBC: 3.84 MIL/uL — ABNORMAL LOW (ref 4.22–5.81)
RDW: 16.8 % — ABNORMAL HIGH (ref 11.5–15.5)
WBC: 7.9 10*3/uL (ref 4.0–10.5)
nRBC: 0 % (ref 0.0–0.2)

## 2021-09-11 LAB — COMPREHENSIVE METABOLIC PANEL
ALT: 14 U/L (ref 0–44)
AST: 18 U/L (ref 15–41)
Albumin: 3.1 g/dL — ABNORMAL LOW (ref 3.5–5.0)
Alkaline Phosphatase: 77 U/L (ref 38–126)
Anion gap: 20 — ABNORMAL HIGH (ref 5–15)
BUN: 63 mg/dL — ABNORMAL HIGH (ref 8–23)
CO2: 21 mmol/L — ABNORMAL LOW (ref 22–32)
Calcium: 9.5 mg/dL (ref 8.9–10.3)
Chloride: 93 mmol/L — ABNORMAL LOW (ref 98–111)
Creatinine, Ser: 12.92 mg/dL — ABNORMAL HIGH (ref 0.61–1.24)
GFR, Estimated: 4 mL/min — ABNORMAL LOW (ref >60)
Glucose, Bld: 95 mg/dL (ref 70–99)
Potassium: 4.8 mmol/L (ref 3.5–5.1)
Sodium: 134 mmol/L — ABNORMAL LOW (ref 135–145)
Total Bilirubin: 0.3 mg/dL (ref 0.3–1.2)
Total Protein: 8.1 g/dL (ref 6.5–8.1)

## 2021-09-11 LAB — HEPATITIS B SURFACE ANTIGEN: Hepatitis B Surface Ag: NONREACTIVE

## 2021-09-11 LAB — CBC
HCT: 38.6 % — ABNORMAL LOW (ref 39.0–52.0)
Hemoglobin: 11.8 g/dL — ABNORMAL LOW (ref 13.0–17.0)
MCH: 28.4 pg (ref 26.0–34.0)
MCHC: 30.6 g/dL (ref 30.0–36.0)
MCV: 93 fL (ref 80.0–100.0)
Platelets: 288 10*3/uL (ref 150–400)
RBC: 4.15 MIL/uL — ABNORMAL LOW (ref 4.22–5.81)
RDW: 16.9 % — ABNORMAL HIGH (ref 11.5–15.5)
WBC: 7.3 10*3/uL (ref 4.0–10.5)
nRBC: 0 % (ref 0.0–0.2)

## 2021-09-11 LAB — POC OCCULT BLOOD, ED: Fecal Occult Bld: POSITIVE — AB

## 2021-09-11 LAB — HEPATITIS B SURFACE ANTIBODY,QUALITATIVE: Hep B S Ab: REACTIVE — AB

## 2021-09-11 LAB — HIV ANTIBODY (ROUTINE TESTING W REFLEX): HIV Screen 4th Generation wRfx: NONREACTIVE

## 2021-09-11 LAB — HEPATITIS B CORE ANTIBODY, TOTAL: Hep B Core Total Ab: NONREACTIVE

## 2021-09-11 LAB — HEPATITIS C ANTIBODY: HCV Ab: NONREACTIVE

## 2021-09-11 MED ORDER — CALCIUM CARBONATE ANTACID 1250 MG/5ML PO SUSP: 500.0000 mg | Freq: Four times a day (QID) | ORAL | Status: DC | PRN

## 2021-09-11 MED ORDER — METOCLOPRAMIDE HCL 5 MG/ML IJ SOLN
10.0000 mg | Freq: Once | INTRAMUSCULAR | Status: AC
  Administered 2021-09-11: 10 mg via INTRAVENOUS
  Filled 2021-09-11: qty 2

## 2021-09-11 MED ORDER — DEXAMETHASONE SODIUM PHOSPHATE 10 MG/ML IJ SOLN: 10.0000 mg | Freq: Once | INTRAMUSCULAR | Status: DC

## 2021-09-11 MED ORDER — CROMOLYN SODIUM 4 % OP SOLN: 1.0000 [drp] | Freq: Every day | OPHTHALMIC | Status: DC | PRN

## 2021-09-11 MED ORDER — CINACALCET HCL 30 MG PO TABS
120.0000 mg | ORAL_TABLET | Freq: Every day | ORAL | Status: DC
  Administered 2021-09-12: 120 mg via ORAL
  Filled 2021-09-11 (×3): qty 4

## 2021-09-11 MED ORDER — PANTOPRAZOLE SODIUM 40 MG IV SOLR
40.0000 mg | Freq: Two times a day (BID) | INTRAVENOUS | Status: DC
  Administered 2021-09-11 – 2021-09-12 (×3): 40 mg via INTRAVENOUS
  Filled 2021-09-11 (×3): qty 10

## 2021-09-11 MED ORDER — HYDRALAZINE HCL 20 MG/ML IJ SOLN: 5.0000 mg | INTRAMUSCULAR | Status: DC | PRN

## 2021-09-11 MED ORDER — PEG-KCL-NACL-NASULF-NA ASC-C 100 G PO SOLR
0.5000 | Freq: Once | ORAL | Status: DC
  Filled 2021-09-11 (×2): qty 1

## 2021-09-11 MED ORDER — SEVELAMER CARBONATE 800 MG PO TABS
4000.0000 mg | ORAL_TABLET | Freq: Three times a day (TID) | ORAL | Status: DC
  Administered 2021-09-12 – 2021-09-13 (×3): 4000 mg via ORAL
  Filled 2021-09-11 (×4): qty 5

## 2021-09-11 MED ORDER — ACETAMINOPHEN 325 MG PO TABS
650.0000 mg | ORAL_TABLET | Freq: Four times a day (QID) | ORAL | Status: DC | PRN
  Administered 2021-09-11 – 2021-09-12 (×2): 650 mg via ORAL
  Filled 2021-09-11 (×2): qty 2

## 2021-09-11 MED ORDER — ONDANSETRON HCL 4 MG PO TABS: 4.0000 mg | ORAL_TABLET | Freq: Four times a day (QID) | ORAL | Status: DC | PRN

## 2021-09-11 MED ORDER — HYDROXYZINE HCL 25 MG PO TABS: 25.0000 mg | ORAL_TABLET | Freq: Three times a day (TID) | ORAL | Status: DC | PRN

## 2021-09-11 MED ORDER — CAMPHOR-MENTHOL 0.5-0.5 % EX LOTN: 1.0000 | TOPICAL_LOTION | Freq: Three times a day (TID) | CUTANEOUS | Status: DC | PRN

## 2021-09-11 MED ORDER — PEG-KCL-NACL-NASULF-NA ASC-C 100 G PO SOLR
1.0000 | Freq: Once | ORAL | Status: DC
Start: 2021-09-11 — End: 2021-09-11
  Filled 2021-09-11: qty 1

## 2021-09-11 MED ORDER — PANTOPRAZOLE SODIUM 40 MG IV SOLR
40.0000 mg | Freq: Once | INTRAVENOUS | Status: AC
  Administered 2021-09-11: 40 mg via INTRAVENOUS
  Filled 2021-09-11: qty 10

## 2021-09-11 MED ORDER — SORBITOL 70 % SOLN
30.0000 mL | Status: DC | PRN
  Filled 2021-09-11: qty 30

## 2021-09-11 MED ORDER — PEG-KCL-NACL-NASULF-NA ASC-C 100 G PO SOLR
0.5000 | Freq: Once | ORAL | Status: AC
  Administered 2021-09-12: 100 g via ORAL
  Filled 2021-09-11: qty 1

## 2021-09-11 MED ORDER — RENA-VITE PO TABS
1.0000 | ORAL_TABLET | Freq: Every day | ORAL | Status: DC
  Administered 2021-09-11 – 2021-09-13 (×3): 1 via ORAL
  Filled 2021-09-11 (×4): qty 1

## 2021-09-11 MED ORDER — MAGNESIUM SULFATE 2 GM/50ML IV SOLN
2.0000 g | Freq: Once | INTRAVENOUS | Status: AC
Start: 2021-09-11 — End: 2021-09-11
  Administered 2021-09-11: 2 g via INTRAVENOUS
  Filled 2021-09-11: qty 50

## 2021-09-11 MED ORDER — DOCUSATE SODIUM 283 MG RE ENEM
1.0000 | ENEMA | RECTAL | Status: DC | PRN
  Filled 2021-09-11: qty 1

## 2021-09-11 MED ORDER — NEPRO/CARBSTEADY PO LIQD: 237.0000 mL | Freq: Three times a day (TID) | ORAL | Status: DC | PRN

## 2021-09-11 MED ORDER — BISACODYL 5 MG PO TBEC
20.0000 mg | DELAYED_RELEASE_TABLET | Freq: Once | ORAL | Status: AC
  Administered 2021-09-11: 20 mg via ORAL
  Filled 2021-09-11: qty 4

## 2021-09-11 MED ORDER — CHLORHEXIDINE GLUCONATE CLOTH 2 % EX PADS
6.0000 | MEDICATED_PAD | Freq: Every day | CUTANEOUS | Status: DC
Start: 2021-09-12 — End: 2021-09-13
  Administered 2021-09-12 – 2021-09-13 (×2): 6 via TOPICAL

## 2021-09-11 MED ORDER — DIPHENHYDRAMINE HCL 50 MG/ML IJ SOLN
25.0000 mg | Freq: Once | INTRAMUSCULAR | Status: AC
  Administered 2021-09-11: 25 mg via INTRAVENOUS
  Filled 2021-09-11: qty 1

## 2021-09-11 MED ORDER — METOCLOPRAMIDE HCL 5 MG/ML IJ SOLN
10.0000 mg | Freq: Four times a day (QID) | INTRAMUSCULAR | Status: AC
  Administered 2021-09-12: 10 mg via INTRAVENOUS
  Filled 2021-09-11: qty 2

## 2021-09-11 MED ORDER — GABAPENTIN 100 MG PO CAPS
100.0000 mg | ORAL_CAPSULE | Freq: Every day | ORAL | Status: DC
  Administered 2021-09-11 – 2021-09-12 (×2): 100 mg via ORAL
  Filled 2021-09-11 (×2): qty 1

## 2021-09-11 MED ORDER — SODIUM ZIRCONIUM CYCLOSILICATE 10 G PO PACK
10.0000 g | PACK | Freq: Every day | ORAL | Status: DC
  Filled 2021-09-11: qty 1

## 2021-09-11 MED ORDER — ONDANSETRON HCL 4 MG/2ML IJ SOLN: 4.0000 mg | Freq: Four times a day (QID) | INTRAMUSCULAR | Status: DC | PRN

## 2021-09-11 MED ORDER — ACETAMINOPHEN 650 MG RE SUPP: 650.0000 mg | Freq: Four times a day (QID) | RECTAL | Status: DC | PRN

## 2021-09-11 MED ORDER — ZOLPIDEM TARTRATE 5 MG PO TABS: 5.0000 mg | ORAL_TABLET | Freq: Every evening | ORAL | Status: DC | PRN

## 2021-09-11 NOTE — Progress Notes (Signed)
Please be mindful, this nurse is getting 4 new patients from the ED.

## 2021-09-11 NOTE — Progress Notes (Signed)
Called Hemodialysis unit to confirm if patient is scheduled for hemodialysis overnight. Patient has orders for dialysis tonight as per HDU nurse, will hold bowel prep as per order.

## 2021-09-11 NOTE — Consult Note (Addendum)
[facility] Consult: 12:13 PM 09/11/2021  LOS: 0 days    Referring Provider:  Benedetto Goad, PA-C in ED Primary Care Physician:  [name], MD Primary Gastroenterologist:  Dr. Candis Schatz     Reason for Consultation:  GI bleed.     HPI: Carl Fritchman Sr. is a 61 y.o. male.  PMH ESRD, MWF HD..  Bilateral nephrectomy for renal cell carcinoma.  Multifactorial anemia including acute blood loss in past and anemia of chronic kidney disease, HGb as low as 5.4 in 11/2020.  Prior treatments with parenteral iron and ESA therapy.  Jehovah's Witness, generally declines blood product transfusions but accepted 2 PRBCs in 07/2015, 3 PRBCs in 11/2020.  2017 colonoscopy for hematochezia, abnormal CT.  Revealed ulcerated, partially obstructing large mass in proximal ascending colon near ICV.  Numerous diverticula in ascending and sigmoid.  Nonbleeding hemorrhoids.  Pathology confirmed colonic mucosa and ulcerative granulation tissue with reactive changes.  No malignancy. 2017 partial colectomy, small bowel resection to address noncancerous cecal mass. 08/01/2019 EGD.  For melena, blood loss anemia.  Revealed 6 mm sessile gastric cardia polyp, not removed.  7 to 8 mm prepyloric ulcer.  Patchy gastritis.  Pathology: Reactive gastropathy, multiple foci of intestinal metaplasia, no dysplasia, no H. pylori 08/03/2015 EGD #2  For follow-up and declining Hgb.  Gastric polyp.  Gastric erythema.  Nonbleeding clean-based gastric ulcer at pyloric channel sized 4 to 5 mm.  No heme or blood seen.  Patchy duodenal bulb mucosa.  This prophylaxing in the second portion of duodenum in and out of the bulb.  No hematin, bleeding or stigmata of bleeding seen. 11/2020 colonoscopy.   Poor prep.  Patent, healthy appearing ileocolonic anastomosis.  Tics in  the sigmoid, descending and transverse colon.  Nonbleeding internal hemorrhoids. 11/2020 EGD. 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  Hiatal hernia.  Enlarged gastric folds.  Nonbleeding gastric ulcer with adherent clot, treated with bipolar cautery.  Based on location MD suspected mechanical etiology.  Nonbleeding duodenal diverticulum.  Congested duodenal mucosa. Path: Peptic duodenitis.  Chronic active gastritis.  Acute esophagitis.  No h pylori.   07/2019 CTAP w contrast: Diffuse colon diverticulosis, subtle inflammation at rectosigmoid, likely mild acute diverticulitis.  Distended GB but no stones or GB inflammation. Patient is not on PPI or H2 blocker at home.   Patient has not undergone reassessment EGD mentioned in Dr. Dayle Points [facility] office note of November 2022.  MD also considered pursuing capsule endoscopy and repeat colonoscopy.  Patient tells me he did not want to do the colonoscopy because he was concerned that he was going to get too much fluid with the large volume of bowel prep required.  Presented to triage today.  Several days of headache.  Has had previous issues with headaches but they had resolved only to recur within the last several days.  Took a couple of bare aspirin but no other NSAIDs or aspirin products to address pain, also used tramadol.  Developed bright red bloody stools last night.  Only 2 episodes between the hours of 7  and 9 PM.  Bowel movements the previous day and that morning were brown.  Has not had any further bleeding or BMs since last night denies nausea, vomiting, abdominal pain. Hypotensive initially 94/56 but has improved and currently 150/79.  Heart rate 90s to 70s.  Room air sats close to her at 100%.  No fever. On DRE by ED staff, stool appeared melenic.  Hgb 11..  11.8.  In October the range was 5.4 -8.8.  MCV 93.  Platelets, WBCs, INR normal.  FOBT positive.    Family history negative for gastrointestinal cancers.   Mother had uterine cancer. Previous smoker.  Single.  Lives in [place].  [name] is his significant other, cell phone [phone]    Past Medical History:  Diagnosis Date   Acute gastric ulcer with hemorrhage    Acute GI bleeding 07/31/2019   Acute pericarditis    Anemia    hx of   Anxiety    situational    Arthritis    on meds   Borderline diabetes    Cataract    bilateral sx   Chronic headaches    Depression    situational    Diverticulitis    ESRD (end stage renal disease) ([place])     On Renal Transplant List," [facility]; MWF" (10/23/2016)   GERD (gastroesophageal reflux disease)    with certain foods   GI bleed    Hypertension    diet controlled   Parathyroid abnormality (HCC)    ectopic parathyroid gland   Presence of arteriovenous fistula for hemodialysis, primary ([place])    RUE PER PT RLE   Refusal of blood product    NO WHOLE BLOOD PROUCTS   Renal cell carcinoma ([place])    s/p hand assisted laparoscopic bilateral nephrectomies 11/29/17, + RCC left   Secondary hyperparathyroidism ([place])    Seizures ([place])    one episode in past, due to" elevated Potassium" 08/02/20- "at least 4 years ago"   Sleep apnea    doesn't use CPAP anymore since weight loss   Stroke [facility])    no residual    Past Surgical History:  Procedure Laterality Date   AV FISTULA PLACEMENT Right    right arm   BIOPSY  08/01/2019   Procedure: BIOPSY;  Surgeon: Juanita Craver, MD;  Location: [facility] ENDOSCOPY;  Service: Endoscopy;;   BIOPSY  11/18/2020   Procedure: BIOPSY;  Surgeon: Daryel November, MD;  Location: [facility] ENDOSCOPY;  Service: Gastroenterology;;   CATARACT EXTRACTION W/ INTRAOCULAR LENS  IMPLANT, BILATERAL     COLON SURGERY     COLONOSCOPY N/A 08/04/2015   Procedure: COLONOSCOPY;  Surgeon: Jerene Bears, MD;  Location: [facility] ENDOSCOPY;  Service: Endoscopy;  Laterality: N/A;   COLONOSCOPY  2017   JMP@ [facility]-good prep-mass -recall 1 yr   COLONOSCOPY WITH PROPOFOL N/A 11/25/2020   Procedure:  COLONOSCOPY WITH PROPOFOL;  Surgeon: Sharyn Creamer, MD;  Location: [place];  Service: Gastroenterology;  Laterality: N/A;   ESOPHAGOGASTRODUODENOSCOPY N/A 08/01/2019   Procedure: ESOPHAGOGASTRODUODENOSCOPY (EGD);  Surgeon: Juanita Craver, MD;  Location: [facility] ENDOSCOPY;  Service: Endoscopy;  Laterality: N/A;   ESOPHAGOGASTRODUODENOSCOPY N/A 11/18/2020   Procedure: ESOPHAGOGASTRODUODENOSCOPY (EGD);  Surgeon: Daryel November, MD;  Location: [place];  Service: Gastroenterology;  Laterality: N/A;   ESOPHAGOGASTRODUODENOSCOPY (EGD) WITH PROPOFOL N/A 08/04/2019   Procedure: ESOPHAGOGASTRODUODENOSCOPY (EGD) WITH PROPOFOL;  Surgeon: Yetta Flock, MD;  Location: [place];  Service: Gastroenterology;  Laterality: N/A;   graft left arm Left  for dialysis x 2. Removed   HOT HEMOSTASIS  11/18/2020   Procedure: HOT HEMOSTASIS (ARGON PLASMA COAGULATION/BICAP);  Surgeon: Daryel November, MD;  Location: [place];  Service: Gastroenterology;;   INSERTION OF DIALYSIS CATHETER     Rt chest   LAPAROSCOPIC RIGHT COLECTOMY N/A 08/05/2015   Procedure: LAPAROSCOPIC RIGHT COLECTOMY- ASCENDING;  Surgeon: Stark Klein, MD;  Location: [place];  Service: General;  Laterality: N/A;   MASS EXCISION Left 05/28/2019   Procedure: EXCISION SOFT TISSUE MASS LEFT SHOULDER;  Surgeon: Armandina Gemma, MD;  Location: [facility];  Service: General;  Laterality: Left;   NEPHRECTOMY Bilateral    PARATHYROIDECTOMY N/A 06/12/2016   Procedure: TOTAL PARATHYROIDECTOMY WITH AUTOTRANSPLANTATION TO LEFT FOREARM;  Surgeon: Armandina Gemma, MD;  Location: [place];  Service: General;  Laterality: N/A;   PARATHYROIDECTOMY N/A 10/23/2016   Procedure: PARATHYROIDECTOMY;  Surgeon: Armandina Gemma, MD;  Location: [place];  Service: General;  Laterality: N/A;   REVERSE SHOULDER ARTHROPLASTY Right 08/24/2020   Procedure: REVERSE SHOULDER ARTHROPLASTY;  Surgeon: Hiram Gash, MD;  Location: [place];  Service: Orthopedics;  Laterality: Right;    REVISON OF ARTERIOVENOUS FISTULA Right 07/16/2017   Procedure: REVISION OF ARTERIOVENOUS FISTULA  Right ARM;  Surgeon: Waynetta Sandy, MD;  Location: [place];  Service: Vascular;  Laterality: Right;   TOTAL HIP ARTHROPLASTY Left 11/14/2020   Procedure: LEFT TOTAL HIP ARTHROPLASTY ANTERIOR APPROACH;  Surgeon: Leandrew Koyanagi, MD;  Location: [place];  Service: Orthopedics;  Laterality: Left;   UPPER GASTROINTESTINAL ENDOSCOPY  2021   @ [facility]    Prior to Admission medications   Medication Sig Start Date End Date Taking? Authorizing Provider  acetaminophen (TYLENOL) 650 MG CR tablet Take 1,300 mg by mouth every 8 (eight) hours as needed for pain.    [provider]  amoxicillin (AMOXIL) 500 MG capsule Take 4 pills one hour prior to dental work 05/04/21   Aundra Dubin, PA-C  aspirin EC 81 MG tablet Take 81 mg by mouth daily. Swallow whole.    [provider]  atorvastatin (LIPITOR) 10 MG tablet Take 10 mg by mouth 2 (two) times a week.    [provider]  camphor-menthol Timoteo Ace) lotion Apply topically 2 (two) times daily. Patient taking differently: Apply 1 application. topically as needed for itching. 11/18/20   Kayleen Memos, DO  cinacalcet (SENSIPAR) 60 MG tablet Take 120 mg by mouth daily with supper.    [provider]  cromolyn (OPTICROM) 4 % ophthalmic solution Place 1 drop into both eyes daily as needed (redness).    [provider]  docusate sodium (COLACE) 100 MG capsule Take 1 capsule (100 mg total) by mouth daily as needed. 11/08/20 11/08/21  Aundra Dubin, PA-C  gabapentin (NEURONTIN) 100 MG capsule Take 100 mg by mouth at bedtime. 05/15/21   [provider]  ibuprofen (ADVIL) 800 MG tablet Take 400 mg by mouth 2 (two) times daily as needed for moderate pain. 07/02/21   [provider]  LOKELMA 10 g PACK packet Take 1 packet by mouth daily. 05/16/21   [provider]  Methoxy PEG-Epoetin Beta (MIRCERA IJ) Mircera  05/24/21 05/23/22  [provider]  midodrine (PROAMATINE) 10 MG tablet Take 10 mg by mouth See admin instructions. Take 10 mg by mouth three times a week as directed, bring to dialysis. May need an additional tablet during treatment.    [provider]  multivitamin (RENA-VIT) TABS tablet Take 1 tablet by  mouth daily.    [provider]  oxyCODONE-acetaminophen (PERCOCET/ROXICET) 5-325 MG tablet Take 1 tablet by mouth every 6 (six) hours as needed for severe pain. 02/03/21   [provider]  sevelamer carbonate (RENVELA) 800 MG tablet Take 40,000 mg by mouth 3 (three) times daily with meals. 5 tabs before meals 06/12/21   [provider]  topiramate (TOPAMAX) 25 MG tablet Take 1 tablet (25 mg total) by mouth 2 (two) times daily. Patient taking differently: Take 25 mg by mouth 2 (two) times daily as needed (headache). 03/08/20   Frann Rider, NP  vitamin B-12 (CYANOCOBALAMIN) 1000 MCG tablet Take 1,000 mcg by mouth daily.    [provider]  vitamin E 180 MG (400 UNITS) capsule Take 400 Units by mouth 3 (three) times a week.    [provider]    Scheduled Meds:  Infusions:  PRN Meds:    Allergies as of 09/11/2021 - Review Complete 09/11/2021  Allergen Reaction Noted   Infed [iron dextran] Other (See Comments) 06/19/2010   Oxycodone Nausea Only 06/19/2010   Vicodin [hydrocodone-acetaminophen] Other (See Comments) 06/19/2010   Hydrocodone-acetaminophen  10/09/2019   Diclofenac Other (See Comments) 07/01/2014    Family History  Problem Relation Age of Onset   Diabetes Father    Stroke Father    Hypertension Father    Uterine cancer Mother    Lupus Sister    Stroke Sister    Hypertension Sister    Anuerysm Brother        brain   Colon cancer Neg Hx    Esophageal cancer Neg Hx    Stomach cancer Neg Hx    Pancreatic cancer Neg Hx    Liver disease Neg Hx    Colon polyps Neg Hx    Rectal cancer Neg Hx     Social  History   Socioeconomic History   Marital status: Single    Spouse name: Not on file   Number of children: 2   Years of education: Not on file   Highest education level: Not on file  Occupational History   Occupation: retired    Fish farm manager: SELF EMPLOYED  Tobacco Use   Smoking status: Former    Types: Cigarettes    Quit date: 06/19/1990    Years since quitting: 31.2   Smokeless tobacco: Never   Tobacco comments:    rarely cigar  Vaping Use   Vaping Use: Never used  Substance and Sexual Activity   Alcohol use: Yes    Alcohol/week: 1.0 standard drink of alcohol    Types: 1 Cans of beer per week    Comment: occasional beer   Drug use: Not Currently    Types: Marijuana    Comment: Last use several yrs ago   Sexual activity: Not Currently  Other Topics Concern   Not on file  Social History Narrative   Lives alone at home   12 years of education    2 children    Not married    Social Determinants of Health   Financial Resource Strain: Not on file  Food Insecurity: Not on file  Transportation Needs: Not on file  Physical Activity: Not on file  Stress: Not on file  Social Connections: Not on file  Intimate Partner Violence: Not on file    REVIEW OF SYSTEMS: Constitutional: Some weakness, not profound.  Some fatigue, not profound.  Generally though has good energy. ENT:  No nose bleeds Pulm: No shortness of breath.  No  cough CV:  No palpitations.trace LE edema.  No angina GU:  No hematuria, no frequency GI: See HPI. Heme: Denies excessive or unusual bleeding or bruising. Transfusions: Willing to accept transfusion with blood products if he is "at death's door" Neuro: Headaches as per HPI.  No dizziness.  No syncope, no seizures.  No peripheral tingling or numbness Derm: Pruritic lesion at the top of his head has been there for about a year, has not been referred to dermatology. Endocrine:  No sweats or chills.  No polyuria or dysuria Immunization: Vaccination history  reviewed. Travel: Not queried.   PHYSICAL EXAM: Vital signs in last 24 hours: Vitals:   09/11/21 0906 09/11/21 1023  BP: 111/76 115/84  Pulse:  72  Resp: 17 16  Temp: 97.8 F (36.6 C) (!) 97.4 F (36.3 C)  SpO2:  100%   Wt Readings from Last 3 Encounters:  09/11/21 87.2 kg  07/03/21 90.1 kg  03/09/21 88.5 kg    General: Patient does not look acutely ill.  He is in no distress.  Laying comfortably on stretcher in ED. Head: No facial asymmetry or swelling.  No signs of head trauma.  There is a raised, erythematous lesion about the size of a quarter with central ulceration at the top of his head.  This area is hairless. Eyes: No scleral icterus or conjunctival pallor.  EOMI Ears: No obvious hearing deficits Nose: No congestion or discharge Mouth: Dentition overall poor.  Mucosa is pink, moist, clear.  Tongue midline. Neck: No JVD Lungs: Clear bilaterally.  No labored breathing, no cough. Heart: RRR.  No MRG.  S1, S2 present. Abdomen: Soft, mildly obese.  Nontender or distended.  Active bowel sounds.  Do not appreciate HSM, masses, bruits, hernias.   Rectal: Did not repeat rectal exam performed by ED staff were stool was dark but rapidly/strongly FOBT positive Musc/Skeltl: No joint swelling or deformity Extremities: Very slight pitting pedal edema more so on the right. Neurologic: Alert.  Appropriate.  Oriented x3.  Moves all 4 limbs without tremor.  No gross deficits, formal strength testing not pursued. Skin: Scalp lesion as described above under "head".  Dr. Lorin Mercy took a photo of this. Psych: Calm, pleasant, cooperative.  Intake/Output from previous day: No intake/output data recorded. Intake/Output this shift: No intake/output data recorded.  LAB RESULTS: Recent Labs    09/11/21 0424 09/11/21 1015  WBC 7.9 7.3  HGB 11.0* 11.8*  HCT 35.0* 38.6*  PLT 301 288   BMET Lab Results  Component Value Date   NA 134 (L) 09/11/2021   NA 133 (L) 11/24/2020   NA 133 (L)  11/23/2020   K 4.8 09/11/2021   K 4.1 11/24/2020   K 3.2 (L) 11/23/2020   CL 93 (L) 09/11/2021   CL 94 (L) 11/24/2020   CL 95 (L) 11/23/2020   CO2 21 (L) 09/11/2021   CO2 30 11/24/2020   CO2 28 11/23/2020   GLUCOSE 95 09/11/2021   GLUCOSE 95 11/24/2020   GLUCOSE 123 (H) 11/23/2020   BUN 63 (H) 09/11/2021   BUN 26 (H) 11/24/2020   BUN 24 (H) 11/23/2020   CREATININE 12.92 (H) 09/11/2021   CREATININE 5.60 (H) 11/24/2020   CREATININE 4.78 (H) 11/23/2020   CALCIUM 9.5 09/11/2021   CALCIUM 8.3 (L) 11/24/2020   CALCIUM 8.4 (L) 11/23/2020   LFT Recent Labs    09/11/21 0424  PROT 8.1  ALBUMIN 3.1*  AST 18  ALT 14  ALKPHOS 77  BILITOT 0.3  PT/INR Lab Results  Component Value Date   INR 1.1 09/11/2021   INR 1.2 11/10/2020   INR 1.1 08/24/2020   Hepatitis Panel No results for input(s): "HEPBSAG", "HCVAB", "HEPAIGM", "HEPBIGM" in the last 72 hours. C-Diff No components found for: "CDIFF" Lipase     Component Value Date/Time   LIPASE 70 (H) 07/31/2019 1932    Drugs of Abuse  No results found for: "LABOPIA", "COCAINSCRNUR", "LABBENZ", "AMPHETMU", "THCU", "LABBARB"   RADIOLOGY STUDIES: CT Head Wo Contrast  Result Date: 09/11/2021 CLINICAL DATA:  Headaches. EXAM: CT HEAD WITHOUT CONTRAST TECHNIQUE: Contiguous axial images were obtained from the base of the skull through the vertex without intravenous contrast. RADIATION DOSE REDUCTION: This exam was performed according to the departmental dose-optimization program which includes automated exposure control, adjustment of the mA and/or kV according to patient size and/or use of iterative reconstruction technique. COMPARISON:  03/12/2018 FINDINGS: Brain: No evidence of acute infarction, hemorrhage, hydrocephalus, extra-axial collection or mass lesion/mass effect. Extensive dural calcification in thickening is again identified and appears similar to the previous exam. Vascular: No hyperdense vessel or unexpected calcification.  Skull: Normal. Negative for fracture or focal lesion. Sinuses/Orbits: No acute finding. Other: None IMPRESSION: 1. No acute intracranial abnormalities. No significant change from prior exam. Electronically Signed   By: Kerby Moors M.D.   On: 09/11/2021 06:29     IMPRESSION:   Acute GI bleed initially at home this was hematochezia, dark more melenic type stool on DRE today.  Suspect he had recurrent diverticular bleed and that this morning DRE was revealing old blood since it had been more than 12 hours since he last passed blood.  Presumed diverticular bleed in past.  Previous upper and lower endoscopies as per HPI with findings including benign gastric polyp, prepyloric ulcers, gastric ulcers, colonic diverticulosis.  Large, noncancerous, near obstructing mass on colonoscopy in 2017 addressed with partial colectomy/small bowel resection.  No PPI, H2 blockers at home.    Hx renal cell cancer.  Status post bilateral nephrectomy.  ESRD.  Hemodialysis MWF  Hx blood loss anemia and anemia of chronic disease.  Current Hgb barely anemic.  Patient tells me that he has been receiving periodic IV iron and ESA agents through dialysis.  Jehovah's Witness.  Prefers not to receive PRBCs but has excepted these on a couple of occasions in the past.    PLAN:       Colonoscopy and EGD, tmrw.  Patient agreeable after I explained to him that the bowel prep is minimally absorbed and would not throw him over the edge in terms of fluid excess.   Azucena Freed  09/11/2021, 12:13 PM Phone [phone]

## 2021-09-11 NOTE — ED Provider Triage Note (Signed)
Emergency Medicine Provider Triage Evaluation Note  Carl Santoli Sr. , a 61 y.o. male  was evaluated in triage.  Pt complains of headache x 3 days, headache is to the top of his head, gradual onset w/ steady progression, currently a 9/10 in severity, taking tylenol without relief. Last night had an episode of diarrhea w/ BRBPR, 1 additional episode today. Blurry vision Friday has resolved. Denies numbness, weakness, dizziness, fever, vomiting, or abdominal pain. Not anticoagulated.   History of GI bleed, ESRD, hypertension, renal cell carcinoma, diverticulitis,  Review of Systems  Per above  Physical Exam  BP (!) 94/56 (BP Location: Left Arm)   Pulse 99   Temp 97.9 F (36.6 C) (Oral)   Resp 16   Ht '6\' 1"'$  (1.854 m)   Wt 87.2 kg   SpO2 98%   BMI 25.36 kg/m  Gen:   Awake, no distress   Resp:  Normal effort  Abd:   No peritoneal signs.  Other:  PERRL. EOMI. No facial droop. Sensation grossly intact x 4. 5/5 symmetric grip strength.   Medical Decision Making  Medically screening exam initiated at 4:06 AM.  Appropriate orders placed.  [name] was informed that the remainder of the evaluation will be completed by another provider, this initial triage assessment does not replace that evaluation, and the importance of remaining in the ED until their evaluation is complete.  Headache BRBPR   Amaryllis Dyke, PA-C 09/11/21 4287

## 2021-09-11 NOTE — Progress Notes (Signed)
[name] BRIEF CONSULT NOTE  [name] 06/03/1960 [id number]   Patient is a 61 yr old male with ESRD on HD MWF since 2008.  Presented to ED today due to severe headache for several days and BRBPR starting yesterday.  Hgb at outpatient unit typically range from 10-12 with last on 7/26 at 10.6.  In the ED initially hypotensive with Hgb 11.8 and positive FOBT, CT head with no acute abnormalities.  Patient has been admitted to observation and we have been consulted for dialysis.    OP HD orders: [facility] 3.75hrs 2K 3Ca 400/700 EDW 87.5kg No heparin Mircera 69mg q2wks last 7/26 Calcitriol 1.729m qHD  Orders written for HD today per regular schedule to dry weight.  Will complete full consult if patient admitted as inpatient.   LiJen MowPA-C [facility] Pager: [phone]

## 2021-09-11 NOTE — ED Provider Notes (Signed)
[facility] EMERGENCY DEPARTMENT Provider Note   CSN: [id number] Arrival date & time: 09/11/21  0404     History  Chief Complaint  Patient presents with   Melena   Headache    Carl Pontillo Sr. is a 61 y.o. male.  [name] is a 61 yo male with a history of chronic HAs, TIA,  ESRD currently on dialysis (MWF) who presents with concern of HA x 3 days and 2 incidences of blood in stool in the past 24 hours. He reports the HA began a few days ago and cannot remember an inciting event. He reports the HA has gradually worsened and is now an 8/10 on the pain scale. He reports the pain is pulsating and located at the top of his head and behind his left eye.  He reports sensitivity to light but no vision changes.  Reports this feels similar to prior headaches but he just has not been able to get it to go away.  He took Tylenol and tramadol without relief.  He reports the blood in his stool was bright red and only occurred with bowel movements. Of note, he was due for dialysis today and reports his last dialysis was on Friday.   The history is provided by the patient.  Headache Associated symptoms: no abdominal pain, no cough, no diarrhea, no dizziness, no fatigue, no nausea, no seizures, no vomiting and no weakness        Home Medications Prior to Admission medications   Medication Sig Start Date End Date Taking? Authorizing Provider  acetaminophen (TYLENOL) 650 MG CR tablet Take 1,300 mg by mouth every 8 (eight) hours as needed for pain.    [provider]  amoxicillin (AMOXIL) 500 MG capsule Take 4 pills one hour prior to dental work 05/04/21   Aundra Dubin, PA-C  aspirin EC 81 MG tablet Take 81 mg by mouth daily. Swallow whole.    [provider]  atorvastatin (LIPITOR) 10 MG tablet Take 10 mg by mouth 2 (two) times a week.    [provider]  camphor-menthol Timoteo Ace) lotion Apply topically 2 (two) times daily. Patient taking  differently: Apply 1 application. topically as needed for itching. 11/18/20   Kayleen Memos, DO  cinacalcet (SENSIPAR) 60 MG tablet Take 120 mg by mouth daily with supper.    [provider]  cromolyn (OPTICROM) 4 % ophthalmic solution Place 1 drop into both eyes daily as needed (redness).    [provider]  docusate sodium (COLACE) 100 MG capsule Take 1 capsule (100 mg total) by mouth daily as needed. 11/08/20 11/08/21  Aundra Dubin, PA-C  gabapentin (NEURONTIN) 100 MG capsule Take 100 mg by mouth at bedtime. 05/15/21   [provider]  ibuprofen (ADVIL) 800 MG tablet Take 400 mg by mouth 2 (two) times daily as needed for moderate pain. 07/02/21   [provider]  LOKELMA 10 g PACK packet Take 1 packet by mouth daily. 05/16/21   [provider]  Methoxy PEG-Epoetin Beta (MIRCERA IJ) Mircera 05/24/21 05/23/22  [provider]  midodrine (PROAMATINE) 10 MG tablet Take 10 mg by mouth See admin instructions. Take 10 mg by mouth three times a week as directed, bring to dialysis. May need an additional tablet during treatment.    [provider]  multivitamin (RENA-VIT) TABS tablet Take 1 tablet by mouth daily.    [provider]  oxyCODONE-acetaminophen (PERCOCET/ROXICET) 5-325 MG tablet Take 1 tablet by  mouth every 6 (six) hours as needed for severe pain. 02/03/21   [provider]  sevelamer carbonate (RENVELA) 800 MG tablet Take 40,000 mg by mouth 3 (three) times daily with meals. 5 tabs before meals 06/12/21   [provider]  topiramate (TOPAMAX) 25 MG tablet Take 1 tablet (25 mg total) by mouth 2 (two) times daily. Patient taking differently: Take 25 mg by mouth 2 (two) times daily as needed (headache). 03/08/20   Frann Rider, NP  vitamin B-12 (CYANOCOBALAMIN) 1000 MCG tablet Take 1,000 mcg by mouth daily.    [provider]  vitamin E 180 MG (400 UNITS) capsule Take 400 Units by mouth 3 (three) times a  week.    [provider]      Allergies    Infed [iron dextran], Oxycodone, Vicodin [hydrocodone-acetaminophen], Hydrocodone-acetaminophen, and Diclofenac    Review of Systems   Review of Systems  Constitutional:  Negative for chills and fatigue.  HENT: Negative.    Eyes:  Negative for visual disturbance.  Respiratory:  Negative for cough and shortness of breath.   Cardiovascular:  Negative for chest pain.  Gastrointestinal:  Positive for blood in stool. Negative for abdominal pain, constipation, diarrhea, nausea and vomiting.  Neurological:  Positive for headaches. Negative for dizziness, seizures, syncope, weakness and light-headedness.    Physical Exam Updated Vital Signs BP 111/76 (BP Location: Left Arm)   Pulse 90   Temp 97.8 F (36.6 C) (Oral)   Resp 17   Ht '6\' 1"'$  (1.854 m)   Wt 87.2 kg   SpO2 98%   BMI 25.36 kg/m  Physical Exam Vitals and nursing note reviewed.  Constitutional:      General: He is not in acute distress.    Appearance: Normal appearance. He is well-developed. He is not diaphoretic.  HENT:     Head: Normocephalic and atraumatic.     Mouth/Throat:     Mouth: Mucous membranes are moist.     Pharynx: Oropharynx is clear.  Eyes:     General:        Right eye: No discharge.        Left eye: No discharge.     Extraocular Movements: Extraocular movements intact.     Pupils: Pupils are equal, round, and reactive to light.  Cardiovascular:     Rate and Rhythm: Normal rate and regular rhythm.     Pulses: Normal pulses.     Heart sounds: Normal heart sounds.  Pulmonary:     Effort: Pulmonary effort is normal. No respiratory distress.     Breath sounds: Normal breath sounds. No wheezing or rales.     Comments: Respirations equal and unlabored, patient able to speak in full sentences, lungs clear to auscultation bilaterally  Abdominal:     General: Bowel sounds are normal. There is no distension.     Palpations: Abdomen is soft. There is no  mass.     Tenderness: There is no abdominal tenderness. There is no guarding.     Comments: Abdomen soft, nondistended, nontender to palpation in all quadrants without guarding or peritoneal signs  Genitourinary:    Comments: Chaperone present during rectal exam.  Black stools with some dark red blood present Musculoskeletal:        General: No deformity.     Cervical back: Neck supple.  Skin:    General: Skin is warm and dry.     Capillary Refill: Capillary refill takes less than 2 seconds.  Neurological:  Mental Status: He is alert and oriented to person, place, and time.     GCS: GCS eye subscore is 4. GCS verbal subscore is 5. GCS motor subscore is 6.     Coordination: Coordination normal.     Comments: Speech is clear, able to follow commands CN III-XII intact Normal strength in upper and lower extremities bilaterally including dorsiflexion and plantar flexion, strong and equal grip strength Sensation normal to light and sharp touch Moves extremities without ataxia, coordination intact  Psychiatric:        Mood and Affect: Mood normal.        Behavior: Behavior normal.     ED Results / Procedures / Treatments   Labs (all labs ordered are listed, but only abnormal results are displayed) Labs Reviewed  CBC WITH DIFFERENTIAL/PLATELET - Abnormal; Notable for the following components:      Result Value   RBC 3.84 (*)    Hemoglobin 11.0 (*)    HCT 35.0 (*)    RDW 16.8 (*)    All other components within normal limits  COMPREHENSIVE METABOLIC PANEL - Abnormal; Notable for the following components:   Sodium 134 (*)    Chloride 93 (*)    CO2 21 (*)    BUN 63 (*)    Creatinine, Ser 12.92 (*)    Albumin 3.1 (*)    GFR, Estimated 4 (*)    Anion gap 20 (*)    All other components within normal limits  POC OCCULT BLOOD, ED - Abnormal; Notable for the following components:   Fecal Occult Bld POSITIVE (*)    All other components within normal limits  PROTIME-INR  CBC     EKG None  Radiology CT Head Wo Contrast  Result Date: 09/11/2021 CLINICAL DATA:  Headaches. EXAM: CT HEAD WITHOUT CONTRAST TECHNIQUE: Contiguous axial images were obtained from the base of the skull through the vertex without intravenous contrast. RADIATION DOSE REDUCTION: This exam was performed according to the departmental dose-optimization program which includes automated exposure control, adjustment of the mA and/or kV according to patient size and/or use of iterative reconstruction technique. COMPARISON:  03/12/2018 FINDINGS: Brain: No evidence of acute infarction, hemorrhage, hydrocephalus, extra-axial collection or mass lesion/mass effect. Extensive dural calcification in thickening is again identified and appears similar to the previous exam. Vascular: No hyperdense vessel or unexpected calcification. Skull: Normal. Negative for fracture or focal lesion. Sinuses/Orbits: No acute finding. Other: None IMPRESSION: 1. No acute intracranial abnormalities. No significant change from prior exam. Electronically Signed   By: Kerby Moors M.D.   On: 09/11/2021 06:29    Procedures Procedures    Medications Ordered in ED Medications  metoCLOPramide (REGLAN) injection 10 mg (has no administration in time range)  diphenhydrAMINE (BENADRYL) injection 25 mg (has no administration in time range)  magnesium sulfate IVPB 2 g 50 mL (has no administration in time range)  pantoprazole (PROTONIX) injection 40 mg (has no administration in time range)    ED Course/ Medical Decision Making/ A&P                           Medical Decision Making Amount and/or Complexity of Data Reviewed Labs: ordered.  Risk Prescription drug management. Decision regarding hospitalization.   Carl Reicher Sr. is a 61 y.o. male presents to the ED for concern of headache, blood in stool, this involves an extensive number of treatment options, and is a complaint that carries with it a high  risk of complications  and morbidity.  The differential for GI bleeding includes upper bleed, peptic ulcer disease, gastritis, diverticular bleed, colitis.  No hemorrhoids or fissures noted.  Patient with history of chronic headaches for which she is followed by neurology and on prophylactic Topamax.  Headache feels like his similar headaches, but just is not going away.  No focal neurologic deficits on exam.  Lower suspicion for secondary cause for headache such as subarachnoid hemorrhage, intraparenchymal hemorrhage, aneurysm, mass, pseudotumor cerebri or temporal arteritis.  Additional history obtained:  Additional history obtained from son at bedside, medical records External records from outside source obtained and reviewed including prior hemoglobins, prior EGD/colonoscopy   Lab Tests:  I Ordered, reviewed, and interpreted labs.  The pertinent results include: No leukocytosis, despite reported bleeding hemoglobin of 11 higher than typical baseline in the setting of ESRD.  Patient due for dialysis today, potassium currently 4.8, BUN of 63, anion gap likely due to uremia.  Patient not symptomatic.  INR normal.  Hemoccult positive.   Imaging Studies ordered:  I ordered imaging studies including head CT, considered abdominal CT but patient with no tenderness on exam in the setting of bleeding. I independently visualized and interpreted imaging which showed no acute intracranial abnormalities I agree with the radiologist interpretation   Cardiac Monitoring:  The patient was maintained on a cardiac monitor.  I personally viewed and interpreted the cardiac monitored which showed an underlying rhythm of: Normal sinus rhythm   Medicines ordered and prescription drug management:  I ordered medication including Protonix for GI bleeding with melena on exam, Reglan, Benadryl, and magnesium for headache cocktail. Reevaluation of the patient after these medicines showed that the patient improved I have reviewed the  patients home medicines and have made adjustments as needed   ED Course:  Patient presents with headache and GI bleed.  Headache without concerning features and with history of chronic headaches, no focal neurologic deficits and head CT clear. 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  We will consult GI. Patient due for dialysis today.  We will also consult nephrology.  Consultations Obtained:  I requested consultation with GI,  and discussed lab and imaging findings as well as pertinent plan -Case discussed with PA Azucena Freed with [facility], they will see patient in consult, with plan for likely EGD/colonoscopy I requested consultation with the nephrologist for dialysis, discussed pertinent labs, imaging and plan with Dr. Carolin Sicks who will see and evaluate patient for dialysis. I requested consultation with the hospitalist for admission.  Case discussed with Dr. Lorin Mercy who will see and admit the patient.    Dispostion:  After consideration of the diagnostic results and the patients response to treatment feel that the patent would benefit from admission.          Final Clinical Impression(s) / ED Diagnoses Final diagnoses:  Gastrointestinal hemorrhage, unspecified gastrointestinal hemorrhage type  Bad headache    Rx / DC Orders ED Discharge Orders     None         Janet Berlin 09/11/21 1658    Blanchie Dessert, MD 09/12/21 [phone]

## 2021-09-11 NOTE — H&P (View-Only) (Signed)
[facility] Consult: 12:13 PM 09/11/2021  LOS: 0 days    Referring Provider:  Benedetto Goad, PA-C in ED Primary Care Physician:  [name], MD Primary Gastroenterologist:  Dr. Candis Schatz     Reason for Consultation:  GI bleed.     HPI: Carl Hermann Sr. is a 61 y.o. male.  PMH ESRD, MWF HD..  Bilateral nephrectomy for renal cell carcinoma.  Multifactorial anemia including acute blood loss in past and anemia of chronic kidney disease, HGb as low as 5.4 in 11/2020.  Prior treatments with parenteral iron and ESA therapy.  Jehovah's Witness, generally declines blood product transfusions but accepted 2 PRBCs in 07/2015, 3 PRBCs in 11/2020.  2017 colonoscopy for hematochezia, abnormal CT.  Revealed ulcerated, partially obstructing large mass in proximal ascending colon near ICV.  Numerous diverticula in ascending and sigmoid.  Nonbleeding hemorrhoids.  Pathology confirmed colonic mucosa and ulcerative granulation tissue with reactive changes.  No malignancy. 2017 partial colectomy, small bowel resection to address noncancerous cecal mass. 08/01/2019 EGD.  For melena, blood loss anemia.  Revealed 6 mm sessile gastric cardia polyp, not removed.  7 to 8 mm prepyloric ulcer.  Patchy gastritis.  Pathology: Reactive gastropathy, multiple foci of intestinal metaplasia, no dysplasia, no H. pylori 08/03/2015 EGD #2  For follow-up and declining Hgb.  Gastric polyp.  Gastric erythema.  Nonbleeding clean-based gastric ulcer at pyloric channel sized 4 to 5 mm.  No heme or blood seen.  Patchy duodenal bulb mucosa.  This prophylaxing in the second portion of duodenum in and out of the bulb.  No hematin, bleeding or stigmata of bleeding seen. 11/2020 colonoscopy.   Poor prep.  Patent, healthy appearing ileocolonic anastomosis.  Tics in  the sigmoid, descending and transverse colon.  Nonbleeding internal hemorrhoids. 11/2020 EGD. 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  Hiatal hernia.  Enlarged gastric folds.  Nonbleeding gastric ulcer with adherent clot, treated with bipolar cautery.  Based on location MD suspected mechanical etiology.  Nonbleeding duodenal diverticulum.  Congested duodenal mucosa. Path: Peptic duodenitis.  Chronic active gastritis.  Acute esophagitis.  No h pylori.   07/2019 CTAP w contrast: Diffuse colon diverticulosis, subtle inflammation at rectosigmoid, likely mild acute diverticulitis.  Distended GB but no stones or GB inflammation. Patient is not on PPI or H2 blocker at home.   Patient has not undergone reassessment EGD mentioned in Dr. Dayle Points [facility] office note of November 2022.  MD also considered pursuing capsule endoscopy and repeat colonoscopy.  Patient tells me he did not want to do the colonoscopy because he was concerned that he was going to get too much fluid with the large volume of bowel prep required.  Presented to triage today.  Several days of headache.  Has had previous issues with headaches but they had resolved only to recur within the last several days.  Took a couple of bare aspirin but no other NSAIDs or aspirin products to address pain, also used tramadol.  Developed bright red bloody stools last night.  Only 2 episodes between the hours of 7  and 9 PM.  Bowel movements the previous day and that morning were brown.  Has not had any further bleeding or BMs since last night denies nausea, vomiting, abdominal pain. Hypotensive initially 94/56 but has improved and currently 150/79.  Heart rate 90s to 70s.  Room air sats close to her at 100%.  No fever. On DRE by ED staff, stool appeared melenic.  Hgb 11..  11.8.  In October the range was 5.4 -8.8.  MCV 93.  Platelets, WBCs, INR normal.  FOBT positive.    Family history negative for gastrointestinal cancers.   Mother had uterine cancer. Previous smoker.  Single.  Lives in [place].  [name] is his significant other, cell phone [phone]    Past Medical History:  Diagnosis Date   Acute gastric ulcer with hemorrhage    Acute GI bleeding 07/31/2019   Acute pericarditis    Anemia    hx of   Anxiety    situational    Arthritis    on meds   Borderline diabetes    Cataract    bilateral sx   Chronic headaches    Depression    situational    Diverticulitis    ESRD (end stage renal disease) ([place])     On Renal Transplant List," [facility]; MWF" (10/23/2016)   GERD (gastroesophageal reflux disease)    with certain foods   GI bleed    Hypertension    diet controlled   Parathyroid abnormality (HCC)    ectopic parathyroid gland   Presence of arteriovenous fistula for hemodialysis, primary ([place])    RUE PER PT RLE   Refusal of blood product    NO WHOLE BLOOD PROUCTS   Renal cell carcinoma ([place])    s/p hand assisted laparoscopic bilateral nephrectomies 11/29/17, + RCC left   Secondary hyperparathyroidism ([place])    Seizures ([place])    one episode in past, due to" elevated Potassium" 08/02/20- "at least 4 years ago"   Sleep apnea    doesn't use CPAP anymore since weight loss   Stroke [facility])    no residual    Past Surgical History:  Procedure Laterality Date   AV FISTULA PLACEMENT Right    right arm   BIOPSY  08/01/2019   Procedure: BIOPSY;  Surgeon: Juanita Craver, MD;  Location: [facility] ENDOSCOPY;  Service: Endoscopy;;   BIOPSY  11/18/2020   Procedure: BIOPSY;  Surgeon: Daryel November, MD;  Location[facility] ENDOSCOPY;  Service: Gastroenterology;;   CATARACT EXTRACTION W/ INTRAOCULAR LENS  IMPLANT, BILATERAL     COLON SURGERY     COLONOSCOPY N/A 08/04/2015   Procedure: COLONOSCOPY;  Surgeon: Jerene Bears, MD;  Location: [facility] ENDOSCOPY;  Service: Endoscopy;  Laterality: N/A;   COLONOSCOPY  2017   JMP@ [facility]-good prep-mass -recall 1 yr   COLONOSCOPY WITH PROPOFOL N/A 11/25/2020   Procedure:  COLONOSCOPY WITH PROPOFOL;  Surgeon: Sharyn Creamer, MD;  Location: [place];  Service: Gastroenterology;  Laterality: N/A;   ESOPHAGOGASTRODUODENOSCOPY N/A 08/01/2019   Procedure: ESOPHAGOGASTRODUODENOSCOPY (EGD);  Surgeon: Juanita Craver, MD;  Location: [facility] ENDOSCOPY;  Service: Endoscopy;  Laterality: N/A;   ESOPHAGOGASTRODUODENOSCOPY N/A 11/18/2020   Procedure: ESOPHAGOGASTRODUODENOSCOPY (EGD);  Surgeon: Daryel November, MD;  Location: [place];  Service: Gastroenterology;  Laterality: N/A;   ESOPHAGOGASTRODUODENOSCOPY (EGD) WITH PROPOFOL N/A 08/04/2019   Procedure: ESOPHAGOGASTRODUODENOSCOPY (EGD) WITH PROPOFOL;  Surgeon: Yetta Flock, MD;  Location: [place];  Service: Gastroenterology;  Laterality: N/A;   graft left arm Left  for dialysis x 2. Removed   HOT HEMOSTASIS  11/18/2020   Procedure: HOT HEMOSTASIS (ARGON PLASMA COAGULATION/BICAP);  Surgeon: Daryel November, MD;  Location: [place];  Service: Gastroenterology;;   INSERTION OF DIALYSIS CATHETER     Rt chest   LAPAROSCOPIC RIGHT COLECTOMY N/A 08/05/2015   Procedure: LAPAROSCOPIC RIGHT COLECTOMY- ASCENDING;  Surgeon: Stark Klein, MD;  Location: [place];  Service: General;  Laterality: N/A;   MASS EXCISION Left 05/28/2019   Procedure: EXCISION SOFT TISSUE MASS LEFT SHOULDER;  Surgeon: Armandina Gemma, MD;  Location: [facility];  Service: General;  Laterality: Left;   NEPHRECTOMY Bilateral    PARATHYROIDECTOMY N/A 06/12/2016   Procedure: TOTAL PARATHYROIDECTOMY WITH AUTOTRANSPLANTATION TO LEFT FOREARM;  Surgeon: Armandina Gemma, MD;  Location: [place];  Service: General;  Laterality: N/A;   PARATHYROIDECTOMY N/A 10/23/2016   Procedure: PARATHYROIDECTOMY;  Surgeon: Armandina Gemma, MD;  Location: [place];  Service: General;  Laterality: N/A;   REVERSE SHOULDER ARTHROPLASTY Right 08/24/2020   Procedure: REVERSE SHOULDER ARTHROPLASTY;  Surgeon: Hiram Gash, MD;  Location: [place];  Service: Orthopedics;  Laterality: Right;    REVISON OF ARTERIOVENOUS FISTULA Right 07/16/2017   Procedure: REVISION OF ARTERIOVENOUS FISTULA  Right ARM;  Surgeon: Waynetta Sandy, MD;  Location: [place];  Service: Vascular;  Laterality: Right;   TOTAL HIP ARTHROPLASTY Left 11/14/2020   Procedure: LEFT TOTAL HIP ARTHROPLASTY ANTERIOR APPROACH;  Surgeon: Leandrew Koyanagi, MD;  Location: [place];  Service: Orthopedics;  Laterality: Left;   UPPER GASTROINTESTINAL ENDOSCOPY  2021   @ [facility]    Prior to Admission medications   Medication Sig Start Date End Date Taking? Authorizing Provider  acetaminophen (TYLENOL) 650 MG CR tablet Take 1,300 mg by mouth every 8 (eight) hours as needed for pain.    [provider]  amoxicillin (AMOXIL) 500 MG capsule Take 4 pills one hour prior to dental work 05/04/21   Aundra Dubin, PA-C  aspirin EC 81 MG tablet Take 81 mg by mouth daily. Swallow whole.    [provider]  atorvastatin (LIPITOR) 10 MG tablet Take 10 mg by mouth 2 (two) times a week.    [provider]  camphor-menthol Timoteo Ace) lotion Apply topically 2 (two) times daily. Patient taking differently: Apply 1 application. topically as needed for itching. 11/18/20   Kayleen Memos, DO  cinacalcet (SENSIPAR) 60 MG tablet Take 120 mg by mouth daily with supper.    [provider]  cromolyn (OPTICROM) 4 % ophthalmic solution Place 1 drop into both eyes daily as needed (redness).    [provider]  docusate sodium (COLACE) 100 MG capsule Take 1 capsule (100 mg total) by mouth daily as needed. 11/08/20 11/08/21  Aundra Dubin, PA-C  gabapentin (NEURONTIN) 100 MG capsule Take 100 mg by mouth at bedtime. 05/15/21   [provider]  ibuprofen (ADVIL) 800 MG tablet Take 400 mg by mouth 2 (two) times daily as needed for moderate pain. 07/02/21   [provider]  LOKELMA 10 g PACK packet Take 1 packet by mouth daily. 05/16/21   [provider]  Methoxy PEG-Epoetin Beta (MIRCERA IJ) Mircera  05/24/21 05/23/22  [provider]  midodrine (PROAMATINE) 10 MG tablet Take 10 mg by mouth See admin instructions. Take 10 mg by mouth three times a week as directed, bring to dialysis. May need an additional tablet during treatment.    [provider]  multivitamin (RENA-VIT) TABS tablet Take 1 tablet by  mouth daily.    [provider]  oxyCODONE-acetaminophen (PERCOCET/ROXICET) 5-325 MG tablet Take 1 tablet by mouth every 6 (six) hours as needed for severe pain. 02/03/21   [provider]  sevelamer carbonate (RENVELA) 800 MG tablet Take 40,000 mg by mouth 3 (three) times daily with meals. 5 tabs before meals 06/12/21   [provider]  topiramate (TOPAMAX) 25 MG tablet Take 1 tablet (25 mg total) by mouth 2 (two) times daily. Patient taking differently: Take 25 mg by mouth 2 (two) times daily as needed (headache). 03/08/20   Frann Rider, NP  vitamin B-12 (CYANOCOBALAMIN) 1000 MCG tablet Take 1,000 mcg by mouth daily.    [provider]  vitamin E 180 MG (400 UNITS) capsule Take 400 Units by mouth 3 (three) times a week.    [provider]    Scheduled Meds:  Infusions:  PRN Meds:    Allergies as of 09/11/2021 - Review Complete 09/11/2021  Allergen Reaction Noted   Infed [iron dextran] Other (See Comments) 06/19/2010   Oxycodone Nausea Only 06/19/2010   Vicodin [hydrocodone-acetaminophen] Other (See Comments) 06/19/2010   Hydrocodone-acetaminophen  10/09/2019   Diclofenac Other (See Comments) 07/01/2014    Family History  Problem Relation Age of Onset   Diabetes Father    Stroke Father    Hypertension Father    Uterine cancer Mother    Lupus Sister    Stroke Sister    Hypertension Sister    Anuerysm Brother        brain   Colon cancer Neg Hx    Esophageal cancer Neg Hx    Stomach cancer Neg Hx    Pancreatic cancer Neg Hx    Liver disease Neg Hx    Colon polyps Neg Hx    Rectal cancer Neg Hx     Social  History   Socioeconomic History   Marital status: Single    Spouse name: Not on file   Number of children: 2   Years of education: Not on file   Highest education level: Not on file  Occupational History   Occupation: retired    Fish farm manager: SELF EMPLOYED  Tobacco Use   Smoking status: Former    Types: Cigarettes    Quit date: 06/19/1990    Years since quitting: 31.2   Smokeless tobacco: Never   Tobacco comments:    rarely cigar  Vaping Use   Vaping Use: Never used  Substance and Sexual Activity   Alcohol use: Yes    Alcohol/week: 1.0 standard drink of alcohol    Types: 1 Cans of beer per week    Comment: occasional beer   Drug use: Not Currently    Types: Marijuana    Comment: Last use several yrs ago   Sexual activity: Not Currently  Other Topics Concern   Not on file  Social History Narrative   Lives alone at home   12 years of education    2 children    Not married    Social Determinants of Health   Financial Resource Strain: Not on file  Food Insecurity: Not on file  Transportation Needs: Not on file  Physical Activity: Not on file  Stress: Not on file  Social Connections: Not on file  Intimate Partner Violence: Not on file    REVIEW OF SYSTEMS: Constitutional: Some weakness, not profound.  Some fatigue, not profound.  Generally though has good energy. ENT:  No nose bleeds Pulm: No shortness of breath.  No  cough CV:  No palpitations.trace LE edema.  No angina GU:  No hematuria, no frequency GI: See HPI. Heme: Denies excessive or unusual bleeding or bruising. Transfusions: Willing to accept transfusion with blood products if he is "at death's door" Neuro: Headaches as per HPI.  No dizziness.  No syncope, no seizures.  No peripheral tingling or numbness Derm: Pruritic lesion at the top of his head has been there for about a year, has not been referred to dermatology. Endocrine:  No sweats or chills.  No polyuria or dysuria Immunization: Vaccination history  reviewed. Travel: Not queried.   PHYSICAL EXAM: Vital signs in last 24 hours: Vitals:   09/11/21 0906 09/11/21 1023  BP: 111/76 115/84  Pulse:  72  Resp: 17 16  Temp: 97.8 F (36.6 C) (!) 97.4 F (36.3 C)  SpO2:  100%   Wt Readings from Last 3 Encounters:  09/11/21 87.2 kg  07/03/21 90.1 kg  03/09/21 88.5 kg    General: Patient does not look acutely ill.  He is in no distress.  Laying comfortably on stretcher in ED. Head: No facial asymmetry or swelling.  No signs of head trauma.  There is a raised, erythematous lesion about the size of a quarter with central ulceration at the top of his head.  This area is hairless. Eyes: No scleral icterus or conjunctival pallor.  EOMI Ears: No obvious hearing deficits Nose: No congestion or discharge Mouth: Dentition overall poor.  Mucosa is pink, moist, clear.  Tongue midline. Neck: No JVD Lungs: Clear bilaterally.  No labored breathing, no cough. Heart: RRR.  No MRG.  S1, S2 present. Abdomen: Soft, mildly obese.  Nontender or distended.  Active bowel sounds.  Do not appreciate HSM, masses, bruits, hernias.   Rectal: Did not repeat rectal exam performed by ED staff were stool was dark but rapidly/strongly FOBT positive Musc/Skeltl: No joint swelling or deformity Extremities: Very slight pitting pedal edema more so on the right. Neurologic: Alert.  Appropriate.  Oriented x3.  Moves all 4 limbs without tremor.  No gross deficits, formal strength testing not pursued. Skin: Scalp lesion as described above under "head".  Dr. Lorin Mercy took a photo of this. Psych: Calm, pleasant, cooperative.  Intake/Output from previous day: No intake/output data recorded. Intake/Output this shift: No intake/output data recorded.  LAB RESULTS: Recent Labs    09/11/21 0424 09/11/21 1015  WBC 7.9 7.3  HGB 11.0* 11.8*  HCT 35.0* 38.6*  PLT 301 288   BMET Lab Results  Component Value Date   NA 134 (L) 09/11/2021   NA 133 (L) 11/24/2020   NA 133 (L)  11/23/2020   K 4.8 09/11/2021   K 4.1 11/24/2020   K 3.2 (L) 11/23/2020   CL 93 (L) 09/11/2021   CL 94 (L) 11/24/2020   CL 95 (L) 11/23/2020   CO2 21 (L) 09/11/2021   CO2 30 11/24/2020   CO2 28 11/23/2020   GLUCOSE 95 09/11/2021   GLUCOSE 95 11/24/2020   GLUCOSE 123 (H) 11/23/2020   BUN 63 (H) 09/11/2021   BUN 26 (H) 11/24/2020   BUN 24 (H) 11/23/2020   CREATININE 12.92 (H) 09/11/2021   CREATININE 5.60 (H) 11/24/2020   CREATININE 4.78 (H) 11/23/2020   CALCIUM 9.5 09/11/2021   CALCIUM 8.3 (L) 11/24/2020   CALCIUM 8.4 (L) 11/23/2020   LFT Recent Labs    09/11/21 0424  PROT 8.1  ALBUMIN 3.1*  AST 18  ALT 14  ALKPHOS 77  BILITOT 0.3  PT/INR Lab Results  Component Value Date   INR 1.1 09/11/2021   INR 1.2 11/10/2020   INR 1.1 08/24/2020   Hepatitis Panel No results for input(s): "HEPBSAG", "HCVAB", "HEPAIGM", "HEPBIGM" in the last 72 hours. C-Diff No components found for: "CDIFF" Lipase     Component Value Date/Time   LIPASE 70 (H) 07/31/2019 1932    Drugs of Abuse  No results found for: "LABOPIA", "COCAINSCRNUR", "LABBENZ", "AMPHETMU", "THCU", "LABBARB"   RADIOLOGY STUDIES: CT Head Wo Contrast  Result Date: 09/11/2021 CLINICAL DATA:  Headaches. EXAM: CT HEAD WITHOUT CONTRAST TECHNIQUE: Contiguous axial images were obtained from the base of the skull through the vertex without intravenous contrast. RADIATION DOSE REDUCTION: This exam was performed according to the departmental dose-optimization program which includes automated exposure control, adjustment of the mA and/or kV according to patient size and/or use of iterative reconstruction technique. COMPARISON:  03/12/2018 FINDINGS: Brain: No evidence of acute infarction, hemorrhage, hydrocephalus, extra-axial collection or mass lesion/mass effect. Extensive dural calcification in thickening is again identified and appears similar to the previous exam. Vascular: No hyperdense vessel or unexpected calcification.  Skull: Normal. Negative for fracture or focal lesion. Sinuses/Orbits: No acute finding. Other: None IMPRESSION: 1. No acute intracranial abnormalities. No significant change from prior exam. Electronically Signed   By: Kerby Moors M.D.   On: 09/11/2021 06:29     IMPRESSION:   Acute GI bleed initially at home this was hematochezia, dark more melenic type stool on DRE today.  Suspect he had recurrent diverticular bleed and that this morning DRE was revealing old blood since it had been more than 12 hours since he last passed blood.  Presumed diverticular bleed in past.  Previous upper and lower endoscopies as per HPI with findings including benign gastric polyp, prepyloric ulcers, gastric ulcers, colonic diverticulosis.  Large, noncancerous, near obstructing mass on colonoscopy in 2017 addressed with partial colectomy/small bowel resection.  No PPI, H2 blockers at home.    Hx renal cell cancer.  Status post bilateral nephrectomy.  ESRD.  Hemodialysis MWF  Hx blood loss anemia and anemia of chronic disease.  Current Hgb barely anemic.  Patient tells me that he has been receiving periodic IV iron and ESA agents through dialysis.  Jehovah's Witness.  Prefers not to receive PRBCs but has excepted these on a couple of occasions in the past.    PLAN:       Colonoscopy and EGD, tmrw.  Patient agreeable after I explained to him that the bowel prep is minimally absorbed and would not throw him over the edge in terms of fluid excess.   Azucena Freed  09/11/2021, 12:13 PM Phone [phone]

## 2021-09-11 NOTE — ED Triage Notes (Signed)
Pt arrived POV from home c/o a headache x several days and then last night he had 2 bright red bloody stools. Pt denies any N/V and denies any abdominal pain.

## 2021-09-11 NOTE — ED Notes (Signed)
Got patient into a gown on the monitor patient is resting with family at bedside 

## 2021-09-11 NOTE — ED Notes (Signed)
ED TO INPATIENT HANDOFF REPORT  ED Nurse Name and Phone #: [phone]   S Name/Age/Gender Carl Found Sr. 62 y.o. male Room/Bed: TRAAC/TRAAC  Code Status   Code Status: Full Code  Home/SNF/Other Home Patient oriented to: self, place, time, and situation Is this baseline? Yes   Triage Complete: Triage complete  Chief Complaint Acute upper GI bleeding [K92.2]  Triage Note Pt arrived POV from home c/o a headache x several days and then last night he had 2 bright red bloody stools. Pt denies any N/V and denies any abdominal pain.    Allergies Allergies  Allergen Reactions   Infed [Iron Dextran] Other (See Comments)    Decreased BP    Oxycodone Nausea Only   Vicodin [Hydrocodone-Acetaminophen] Other (See Comments)    Decreased BP   Hydrocodone-Acetaminophen     Other reaction(s): lower BP   Diclofenac Other (See Comments)    Unknown    Level of Care/Admitting Diagnosis ED Disposition     ED Disposition  Admit   Condition  --   Comment  Hospital Area: [place] [[id number]]  Level of Care: Med-Surg [16]  May place patient in observation at [facility] or [place] if equivalent level of care is available:: No  Covid Evaluation: Asymptomatic - no recent exposure (last 10 days) testing not required  Diagnosis: Acute upper GI bleeding [030092]  Admitting Physician: Karmen Bongo [[id number]]  Attending Physician: Karmen Bongo [[id number]]          B Medical/Surgery History Past Medical History:  Diagnosis Date   Acute gastric ulcer with hemorrhage    Acute GI bleeding 07/31/2019   Acute pericarditis    Anemia    hx of   Anxiety    situational    Arthritis    on meds   Borderline diabetes    Cataract    bilateral sx   Chronic headaches    Depression    situational    Diverticulitis    ESRD (end stage renal disease) ([place])     On Renal Transplant List," [facility]; MWF" (10/23/2016)   GERD (gastroesophageal reflux disease)    with  certain foods   GI bleed    Hypertension    diet controlled   Parathyroid abnormality ([place])    ectopic parathyroid gland   Presence of arteriovenous fistula for hemodialysis, primary ([place])    RUE PER PT RLE   Refusal of blood product    NO WHOLE BLOOD PROUCTS   Renal cell carcinoma ([place])    s/p hand assisted laparoscopic bilateral nephrectomies 11/29/17, + RCC left   Secondary hyperparathyroidism ([place])    Seizures ([place])    one episode in past, due to" elevated Potassium" 08/02/20- "at least 4 years ago"   Sleep apnea    doesn't use CPAP anymore since weight loss   Stroke [facility])    no residual   Past Surgical History:  Procedure Laterality Date   AV FISTULA PLACEMENT Right    right arm   BIOPSY  08/01/2019   Procedure: BIOPSY;  Surgeon: Juanita Craver, MD;  Location: [facility] ENDOSCOPY;  Service: Endoscopy;;   BIOPSY  11/18/2020   Procedure: BIOPSY;  Surgeon: Daryel November, MD;  Location: [place];  Service: Gastroenterology;;   CATARACT EXTRACTION W/ INTRAOCULAR LENS  IMPLANT, BILATERAL     COLON SURGERY     COLONOSCOPY N/A 08/04/2015   Procedure: COLONOSCOPY;  Surgeon: Jerene Bears, MD;  Location: [facility] ENDOSCOPY;  Service: Endoscopy;  Laterality:  N/A;   COLONOSCOPY  2017   JMP@ [facility]-good prep-mass -recall 1 yr   COLONOSCOPY WITH PROPOFOL N/A 11/25/2020   Procedure: COLONOSCOPY WITH PROPOFOL;  Surgeon: Sharyn Creamer, MD;  Location: [place];  Service: Gastroenterology;  Laterality: N/A;   ESOPHAGOGASTRODUODENOSCOPY N/A 08/01/2019   Procedure: ESOPHAGOGASTRODUODENOSCOPY (EGD);  Surgeon: Juanita Craver, MD;  Location[facility] ENDOSCOPY;  Service: Endoscopy;  Laterality: N/A;   ESOPHAGOGASTRODUODENOSCOPY N/A 11/18/2020   Procedure: ESOPHAGOGASTRODUODENOSCOPY (EGD);  Surgeon: Daryel November, MD;  Location: [place];  Service: Gastroenterology;  Laterality: N/A;   ESOPHAGOGASTRODUODENOSCOPY (EGD) WITH PROPOFOL N/A 08/04/2019   Procedure: ESOPHAGOGASTRODUODENOSCOPY (EGD) WITH  PROPOFOL;  Surgeon: Yetta Flock, MD;  Location: [place];  Service: Gastroenterology;  Laterality: N/A;   graft left arm Left    for dialysis x 2. Removed   HOT HEMOSTASIS  11/18/2020   Procedure: HOT HEMOSTASIS (ARGON PLASMA COAGULATION/BICAP);  Surgeon: Daryel November, MD;  Location: [place];  Service: Gastroenterology;;   INSERTION OF DIALYSIS CATHETER     Rt chest   LAPAROSCOPIC RIGHT COLECTOMY N/A 08/05/2015   Procedure: LAPAROSCOPIC RIGHT COLECTOMY- ASCENDING;  Surgeon: Stark Klein, MD;  Location: [place];  Service: General;  Laterality: N/A;   MASS EXCISION Left 05/28/2019   Procedure: EXCISION SOFT TISSUE MASS LEFT SHOULDER;  Surgeon: Armandina Gemma, MD;  Location: [facility];  Service: General;  Laterality: Left;   NEPHRECTOMY Bilateral    PARATHYROIDECTOMY N/A 06/12/2016   Procedure: TOTAL PARATHYROIDECTOMY WITH AUTOTRANSPLANTATION TO LEFT FOREARM;  Surgeon: Armandina Gemma, MD;  Location: [place];  Service: General;  Laterality: N/A;   PARATHYROIDECTOMY N/A 10/23/2016   Procedure: PARATHYROIDECTOMY;  Surgeon: Armandina Gemma, MD;  Location: [place];  Service: General;  Laterality: N/A;   REVERSE SHOULDER ARTHROPLASTY Right 08/24/2020   Procedure: REVERSE SHOULDER ARTHROPLASTY;  Surgeon: Hiram Gash, MD;  Location: [place];  Service: Orthopedics;  Laterality: Right;   REVISON OF ARTERIOVENOUS FISTULA Right 07/16/2017   Procedure: REVISION OF ARTERIOVENOUS FISTULA  Right ARM;  Surgeon: Waynetta Sandy, MD;  Location: [place];  Service: Vascular;  Laterality: Right;   TOTAL HIP ARTHROPLASTY Left 11/14/2020   Procedure: LEFT TOTAL HIP ARTHROPLASTY ANTERIOR APPROACH;  Surgeon: Leandrew Koyanagi, MD;  Location: [place];  Service: Orthopedics;  Laterality: Left;   UPPER GASTROINTESTINAL ENDOSCOPY  2021   @ [facility]     A IV Location/Drains/Wounds Patient Lines/Drains/Airways Status     Active Line/Drains/Airways     Name Placement date Placement time Site Days   Peripheral IV  09/11/21 20 G Distal;Left;Lateral Forearm 09/11/21  1045  Forearm  less than 1   Fistula / Graft Right Forearm Arteriovenous fistula 07/16/17  1158  Forearm  1518   Fistula / Graft Right Forearm --  --  Forearm  --   Negative Pressure Wound Therapy Hip Anterior;Left 11/14/20  1744  --  301   Incision (Closed) 08/24/20 Shoulder Right 08/24/20  1015  -- 383   Incision (Closed) 11/14/20 Hip Left 11/14/20  1641  -- 301            Intake/Output Last 24 hours No intake or output data in the 24 hours ending 09/11/21 1726  Labs/Imaging Results for orders placed or performed during the hospital encounter of 09/11/21 (from the past 48 hour(s))  CBC with Differential     Status: Abnormal   Collection Time: 09/11/21  4:24 AM  Result Value Ref Range   WBC 7.9 4.0 - 10.5 K/uL   RBC  3.84 (L) 4.22 - 5.81 MIL/uL   Hemoglobin 11.0 (L) 13.0 - 17.0 g/dL   HCT 35.0 (L) 39.0 - 52.0 %   MCV 91.1 80.0 - 100.0 fL   MCH 28.6 26.0 - 34.0 pg   MCHC 31.4 30.0 - 36.0 g/dL   RDW 16.8 (H) 11.5 - 15.5 %   Platelets 301 150 - 400 K/uL   nRBC 0.0 0.0 - 0.2 %   Neutrophils Relative % 73 %   Neutro Abs 5.8 1.7 - 7.7 K/uL   Lymphocytes Relative 12 %   Lymphs Abs 1.0 0.7 - 4.0 K/uL   Monocytes Relative 9 %   Monocytes Absolute 0.7 0.1 - 1.0 K/uL   Eosinophils Relative 4 %   Eosinophils Absolute 0.3 0.0 - 0.5 K/uL   Basophils Relative 1 %   Basophils Absolute 0.1 0.0 - 0.1 K/uL   Immature Granulocytes 1 %   Abs Immature Granulocytes 0.05 0.00 - 0.07 K/uL    Comment: Performed at [facility] [address] [place], [place] [address]  Comprehensive metabolic panel     Status: Abnormal   Collection Time: 09/11/21  4:24 AM  Result Value Ref Range   Sodium 134 (L) 135 - 145 mmol/L   Potassium 4.8 3.5 - 5.1 mmol/L   Chloride 93 (L) 98 - 111 mmol/L   CO2 21 (L) 22 - 32 mmol/L   Glucose, Bld 95 70 - 99 mg/dL    Comment: Glucose reference range applies only to samples taken after fasting for at least 8  hours.   BUN 63 (H) 8 - 23 mg/dL   Creatinine, Ser 12.92 (H) 0.61 - 1.24 mg/dL   Calcium 9.5 8.9 - 10.3 mg/dL   Total Protein 8.1 6.5 - 8.1 g/dL   Albumin 3.1 (L) 3.5 - 5.0 g/dL   AST 18 15 - 41 U/L   ALT 14 0 - 44 U/L   Alkaline Phosphatase 77 38 - 126 U/L   Total Bilirubin 0.3 0.3 - 1.2 mg/dL   GFR, Estimated 4 (L) >60 mL/min    Comment: (NOTE) Calculated using the CKD-EPI Creatinine Equation (2021)    Anion gap 20 (H) 5 - 15    Comment: Performed at [facility], [place] [address], [place], [place] [address]  Protime-INR     Status: None   Collection Time: 09/11/21  4:24 AM  Result Value Ref Range   Prothrombin Time 14.3 11.4 - 15.2 seconds   INR 1.1 0.8 - 1.2    Comment: (NOTE) INR goal varies based on device and disease states. Performed at [facility], [place] [address] [place], [place] [address]   CBC     Status: Abnormal   Collection Time: 09/11/21 10:15 AM  Result Value Ref Range   WBC 7.3 4.0 - 10.5 K/uL   RBC 4.15 (L) 4.22 - 5.81 MIL/uL   Hemoglobin 11.8 (L) 13.0 - 17.0 g/dL   HCT 38.6 (L) 39.0 - 52.0 %   MCV 93.0 80.0 - 100.0 fL   MCH 28.4 26.0 - 34.0 pg   MCHC 30.6 30.0 - 36.0 g/dL   RDW 16.9 (H) 11.5 - 15.5 %   Platelets 288 150 - 400 K/uL   nRBC 0.0 0.0 - 0.2 %    Comment: Performed at [facility], [place] [address], [place], [place] [address]  POC occult blood, ED     Status: Abnormal   Collection Time: 09/11/21 10:46 AM  Result Value Ref Range   Fecal Occult Bld POSITIVE (A) NEGATIVE   CT Head Wo Contrast  Result Date: 09/11/2021 CLINICAL DATA:  Headaches. EXAM: CT HEAD WITHOUT CONTRAST TECHNIQUE: Contiguous axial images were obtained from the base of the skull through the vertex without intravenous contrast. RADIATION DOSE REDUCTION: This exam was performed according to the departmental dose-optimization program which includes automated exposure control, adjustment of the mA and/or kV according to patient size and/or use of iterative  reconstruction technique. COMPARISON:  03/12/2018 FINDINGS: Brain: No evidence of acute infarction, hemorrhage, hydrocephalus, extra-axial collection or mass lesion/mass effect. Extensive dural calcification in thickening is again identified and appears similar to the previous exam. Vascular: No hyperdense vessel or unexpected calcification. Skull: Normal. Negative for fracture or focal lesion. Sinuses/Orbits: No acute finding. Other: None IMPRESSION: 1. No acute intracranial abnormalities. No significant change from prior exam. Electronically Signed   By: Kerby Moors M.D.   On: 09/11/2021 06:29    Pending Labs Unresulted Labs (From admission, onward)     Start     Ordered   09/12/21 5974  Basic metabolic panel  Tomorrow morning,   R        09/11/21 1332   09/12/21 0500  CBC  Tomorrow morning,   R        09/11/21 1332   09/11/21 1400  Hepatitis B surface antigen  (New Admission Hemo Labs (Hepatitis B))  Once,   R        09/11/21 1401   09/11/21 1400  Hepatitis B surface antibody  (New Admission Hemo Labs (Hepatitis B))  Once,   R        09/11/21 1401   09/11/21 1400  Hepatitis B surface antibody,quantitative  (New Admission Hemo Labs (Hepatitis B))  Once,   R        09/11/21 1401   09/11/21 1400  Hepatitis B core antibody, total  (New Admission Hemo Labs (Hepatitis B))  Once,   R        09/11/21 1401   09/11/21 1400  Hepatitis C antibody  (New Admission Hemo Labs (Hepatitis B))  Once,   R        09/11/21 1401   09/11/21 1332  HIV Antibody (routine testing w rflx)  (HIV Antibody (Routine testing w reflex) panel)  Once,   R        09/11/21 1332   Signed and Held  Renal function panel  Once,   R        Signed and Held   Signed and Held  CBC  Once,   R        Signed and Held            Vitals/Pain Today's Vitals   09/11/21 1400 09/11/21 1430 09/11/21 1530 09/11/21 1600  BP: 128/84 133/87 114/71 140/86  Pulse:   72 88  Resp: '12 17 10 19  ' Temp:    (!) 97.5 F (36.4 C)  TempSrc:     Temporal  SpO2:   100% 100%  Weight:      Height:      PainSc:        Isolation Precautions No active isolations  Medications Medications  cinacalcet (SENSIPAR) tablet 120 mg (has no administration in time range)  sevelamer carbonate (RENVELA) tablet 4,000 mg (4,000 mg Oral Not Given 09/11/21 1702)  sodium zirconium cyclosilicate (LOKELMA) packet 10 g (has no administration in time range)  gabapentin (NEURONTIN) capsule 100 mg (has no administration  in time range)  multivitamin (RENA-VIT) tablet 1 tablet (1 tablet Oral Given 09/11/21 1422)  cromolyn (OPTICROM) 4 % ophthalmic solution 1 drop (has no administration in time range)  acetaminophen (TYLENOL) tablet 650 mg (650 mg Oral Given 09/11/21 1702)    Or  acetaminophen (TYLENOL) suppository 650 mg ( Rectal See Alternative 09/11/21 1702)  zolpidem (AMBIEN) tablet 5 mg (has no administration in time range)  sorbitol 70 % solution 30 mL (has no administration in time range)  docusate sodium (ENEMEEZ) enema 283 mg (has no administration in time range)  ondansetron (ZOFRAN) tablet 4 mg (has no administration in time range)    Or  ondansetron (ZOFRAN) injection 4 mg (has no administration in time range)  camphor-menthol (SARNA) lotion 1 Application (has no administration in time range)    And  hydrOXYzine (ATARAX) tablet 25 mg (has no administration in time range)  calcium carbonate (dosed in mg elemental calcium) suspension 500 mg of elemental calcium (has no administration in time range)  feeding supplement (NEPRO CARB STEADY) liquid 237 mL (has no administration in time range)  hydrALAZINE (APRESOLINE) injection 5 mg (has no administration in time range)  metoCLOPramide (REGLAN) injection 10 mg (has no administration in time range)  pantoprazole (PROTONIX) injection 40 mg (has no administration in time range)  Chlorhexidine Gluconate Cloth 2 % PADS 6 each (has no administration in time range)  peg 3350 powder (MOVIPREP) kit 100 g (has  no administration in time range)    And  peg 3350 powder (MOVIPREP) kit 100 g (has no administration in time range)  metoCLOPramide (REGLAN) injection 10 mg (10 mg Intravenous Given 09/11/21 1054)  diphenhydrAMINE (BENADRYL) injection 25 mg (25 mg Intravenous Given 09/11/21 1054)  magnesium sulfate IVPB 2 g 50 mL (0 g Intravenous Stopped 09/11/21 1202)  pantoprazole (PROTONIX) injection 40 mg (40 mg Intravenous Given 09/11/21 1055)  bisacodyl (DULCOLAX) EC tablet 20 mg (20 mg Oral Given 09/11/21 1428)    Mobility walks Low fall risk   Focused Assessments Renal Assessment Handoff:  Hemodialysis Schedule: Hemodialysis Schedule: Monday/Wednesday/Friday    Restricted appendage: right arm   R Recommendations: See Admitting Provider Note  Report given to:   Additional Notes:

## 2021-09-11 NOTE — H&P (Signed)
History and Physical    Patient: [name] JJK:[id number] DOB: 08/01/1960 DOA: 09/11/2021 DOS: the patient was seen and examined on 09/11/2021 PCP: [name], MD  Patient coming from: Home - lives with brother; NOK: Evann, Erazo, [phone], or son, Josealfredo Adkins, [phone]   Chief Complaint: GI bleeding  HPI: Carl Burley Sr. is a 61 y.o. male with medical history significant of GI bleeding with refusal of blood products; ESRD on MWF HD; HTN; and OSA not on CPAP presenting with GI bleeding.  Prior colonoscopy was on 11/25/2020 with diverticulosis and non-bleeding internal hemorrhoids.  EGD was a week prior on 10/7 and showed esophagitis dessicans with pathology c/w acute esophagitis, a 3cm hiatal hernia, a non-bleeding gastric ulcer with adherent clot treated with bipolar cautery with pathology c/w chronic active gastritis, and non-bleeding duodenal diverticulum with mucosal congestion with pathology c/w peptic duodenitis.  He reports that he has been having headache (has known migraines) and took several ASA and Topamax without improvement, denies NSAID use although he acknowledges having some at home just in case.  Last night, he had 2 episodes of dark maroon stools without pain.  No further bleeding since.    ER Course:  h/o ESRD, prior GI bleeds.  Reported headache that wouldn't improve, takes Topamax for prophylaxis, clear head CT. Also with rectal bleeding x 24 hours.  Melena, burgundy stools.  Hgb is concentrated at 11.  Will not accept blood products.  GI will see, nephrology notified.     Review of Systems: As mentioned in the history of present illness. All other systems reviewed and are negative. Past Medical History:  Diagnosis Date   Acute gastric ulcer with hemorrhage    Acute GI bleeding 07/31/2019   Acute pericarditis    Anemia    hx of   Anxiety    situational    Arthritis    on meds   Borderline diabetes    Cataract    bilateral sx    Chronic headaches    Depression    situational    Diverticulitis    ESRD (end stage renal disease) ([place])     On Renal Transplant List," [facility]; MWF" (10/23/2016)   GERD (gastroesophageal reflux disease)    with certain foods   GI bleed    Hypertension    diet controlled   Parathyroid abnormality ([place])    ectopic parathyroid gland   Presence of arteriovenous fistula for hemodialysis, primary ([place])    RUE PER PT RLE   Refusal of blood product    NO WHOLE BLOOD PROUCTS   Renal cell carcinoma ([place])    s/p hand assisted laparoscopic bilateral nephrectomies 11/29/17, + RCC left   Secondary hyperparathyroidism ([place])    Seizures ([place])    one episode in past, due to" elevated Potassium" 08/02/20- "at least 4 years ago"   Sleep apnea    doesn't use CPAP anymore since weight loss   Stroke [facility])    no residual   Past Surgical History:  Procedure Laterality Date   AV FISTULA PLACEMENT Right    right arm   BIOPSY  08/01/2019   Procedure: BIOPSY;  Surgeon: Juanita Craver, MD;  Location: [facility] ENDOSCOPY;  Service: Endoscopy;;   BIOPSY  11/18/2020   Procedure: BIOPSY;  Surgeon: Daryel November, MD;  Location: [facility] ENDOSCOPY;  Service: Gastroenterology;;   CATARACT EXTRACTION W/ INTRAOCULAR LENS  IMPLANT, BILATERAL     COLON SURGERY     COLONOSCOPY N/A 08/04/2015   Procedure:  COLONOSCOPY;  Surgeon: Jerene Bears, MD;  Location: [facility] ENDOSCOPY;  Service: Endoscopy;  Laterality: N/A;   COLONOSCOPY  2017   JMP@ [facility]-good prep-mass -recall 1 yr   COLONOSCOPY WITH PROPOFOL N/A 11/25/2020   Procedure: COLONOSCOPY WITH PROPOFOL;  Surgeon: Sharyn Creamer, MD;  Location: [place];  Service: Gastroenterology;  Laterality: N/A;   ESOPHAGOGASTRODUODENOSCOPY N/A 08/01/2019   Procedure: ESOPHAGOGASTRODUODENOSCOPY (EGD);  Surgeon: Juanita Craver, MD;  Location: [facility] ENDOSCOPY;  Service: Endoscopy;  Laterality: N/A;   ESOPHAGOGASTRODUODENOSCOPY N/A 11/18/2020   Procedure: ESOPHAGOGASTRODUODENOSCOPY (EGD);   Surgeon: Daryel November, MD;  Location: [place];  Service: Gastroenterology;  Laterality: N/A;   ESOPHAGOGASTRODUODENOSCOPY (EGD) WITH PROPOFOL N/A 08/04/2019   Procedure: ESOPHAGOGASTRODUODENOSCOPY (EGD) WITH PROPOFOL;  Surgeon: Yetta Flock, MD;  Location: [place];  Service: Gastroenterology;  Laterality: N/A;   graft left arm Left    for dialysis x 2. Removed   HOT HEMOSTASIS  11/18/2020   Procedure: HOT HEMOSTASIS (ARGON PLASMA COAGULATION/BICAP);  Surgeon: Daryel November, MD;  Location: [place];  Service: Gastroenterology;;   INSERTION OF DIALYSIS CATHETER     Rt chest   LAPAROSCOPIC RIGHT COLECTOMY N/A 08/05/2015   Procedure: LAPAROSCOPIC RIGHT COLECTOMY- ASCENDING;  Surgeon: Stark Klein, MD;  Location: [place];  Service: General;  Laterality: N/A;   MASS EXCISION Left 05/28/2019   Procedure: EXCISION SOFT TISSUE MASS LEFT SHOULDER;  Surgeon: Armandina Gemma, MD;  Location: [facility];  Service: General;  Laterality: Left;   NEPHRECTOMY Bilateral    PARATHYROIDECTOMY N/A 06/12/2016   Procedure: TOTAL PARATHYROIDECTOMY WITH AUTOTRANSPLANTATION TO LEFT FOREARM;  Surgeon: Armandina Gemma, MD;  Location: [place];  Service: General;  Laterality: N/A;   PARATHYROIDECTOMY N/A 10/23/2016   Procedure: PARATHYROIDECTOMY;  Surgeon: Armandina Gemma, MD;  Location: [place];  Service: General;  Laterality: N/A;   REVERSE SHOULDER ARTHROPLASTY Right 08/24/2020   Procedure: REVERSE SHOULDER ARTHROPLASTY;  Surgeon: Hiram Gash, MD;  Location: [place];  Service: Orthopedics;  Laterality: Right;   REVISON OF ARTERIOVENOUS FISTULA Right 07/16/2017   Procedure: REVISION OF ARTERIOVENOUS FISTULA  Right ARM;  Surgeon: Waynetta Sandy, MD;  Location: [place];  Service: Vascular;  Laterality: Right;   TOTAL HIP ARTHROPLASTY Left 11/14/2020   Procedure: LEFT TOTAL HIP ARTHROPLASTY ANTERIOR APPROACH;  Surgeon: Leandrew Koyanagi, MD;  Location: [place];  Service: Orthopedics;  Laterality: Left;    UPPER GASTROINTESTINAL ENDOSCOPY  2021   @ [facility]   Social History:  reports that he quit smoking about 31 years ago. His smoking use included cigarettes. He has never used smokeless tobacco. He reports current alcohol use of about 1.0 standard drink of alcohol per week. He reports that he does not currently use drugs after having used the following drugs: Marijuana.  Allergies  Allergen Reactions   Infed [Iron Dextran] Other (See Comments)    Decreased BP    Oxycodone Nausea Only   Vicodin [Hydrocodone-Acetaminophen] Other (See Comments)    Decreased BP   Hydrocodone-Acetaminophen     Other reaction(s): lower BP   Diclofenac Other (See Comments)    Unknown    Family History  Problem Relation Age of Onset   Diabetes Father    Stroke Father    Hypertension Father    Uterine cancer Mother    Lupus Sister    Stroke Sister    Hypertension Sister    Anuerysm Brother        brain   Colon cancer Neg Hx  Esophageal cancer Neg Hx    Stomach cancer Neg Hx    Pancreatic cancer Neg Hx    Liver disease Neg Hx    Colon polyps Neg Hx    Rectal cancer Neg Hx     Prior to Admission medications   Medication Sig Start Date End Date Taking? Authorizing Provider  acetaminophen (TYLENOL) 650 MG CR tablet Take 1,300 mg by mouth every 8 (eight) hours as needed for pain.    [provider]  amoxicillin (AMOXIL) 500 MG capsule Take 4 pills one hour prior to dental work 05/04/21   Aundra Dubin, PA-C  aspirin EC 81 MG tablet Take 81 mg by mouth daily. Swallow whole.    [provider]  atorvastatin (LIPITOR) 10 MG tablet Take 10 mg by mouth 2 (two) times a week.    [provider]  camphor-menthol Timoteo Ace) lotion Apply topically 2 (two) times daily. Patient taking differently: Apply 1 application. topically as needed for itching. 11/18/20   Kayleen Memos, DO  cinacalcet (SENSIPAR) 60 MG tablet Take 120 mg by mouth daily with supper.    [provider]   cromolyn (OPTICROM) 4 % ophthalmic solution Place 1 drop into both eyes daily as needed (redness).    [provider]  docusate sodium (COLACE) 100 MG capsule Take 1 capsule (100 mg total) by mouth daily as needed. 11/08/20 11/08/21  Aundra Dubin, PA-C  gabapentin (NEURONTIN) 100 MG capsule Take 100 mg by mouth at bedtime. 05/15/21   [provider]  ibuprofen (ADVIL) 800 MG tablet Take 400 mg by mouth 2 (two) times daily as needed for moderate pain. 07/02/21   [provider]  LOKELMA 10 g PACK packet Take 1 packet by mouth daily. 05/16/21   [provider]  Methoxy PEG-Epoetin Beta (MIRCERA IJ) Mircera 05/24/21 05/23/22  [provider]  midodrine (PROAMATINE) 10 MG tablet Take 10 mg by mouth See admin instructions. Take 10 mg by mouth three times a week as directed, bring to dialysis. May need an additional tablet during treatment.    [provider]  multivitamin (RENA-VIT) TABS tablet Take 1 tablet by mouth daily.    [provider]  oxyCODONE-acetaminophen (PERCOCET/ROXICET) 5-325 MG tablet Take 1 tablet by mouth every 6 (six) hours as needed for severe pain. 02/03/21   [provider]  sevelamer carbonate (RENVELA) 800 MG tablet Take 40,000 mg by mouth 3 (three) times daily with meals. 5 tabs before meals 06/12/21   [provider]  topiramate (TOPAMAX) 25 MG tablet Take 1 tablet (25 mg total) by mouth 2 (two) times daily. Patient taking differently: Take 25 mg by mouth 2 (two) times daily as needed (headache). 03/08/20   Frann Rider, NP  vitamin B-12 (CYANOCOBALAMIN) 1000 MCG tablet Take 1,000 mcg by mouth daily.    [provider]  vitamin E 180 MG (400 UNITS) capsule Take 400 Units by mouth 3 (three) times a week.    [provider]    Physical Exam: Vitals:   09/11/21 0906 09/11/21 1023 09/11/21 1130 09/11/21 1200  BP: 111/76 115/84 111/76 (!) 150/79  Pulse:  72    Resp: 17 16 (!) 9 12   Temp: 97.8 F (36.6 C) (!) 97.4 F (36.3 C)    TempSrc: Oral Oral    SpO2:  100%    Weight:      Height:       General:  Appears calm and comfortable and is in  NAD, holding his head and keeping his eyes mostly closed Eyes:   EOMI, normal lids, iris; injected conjunctivae ENT:  grossly normal hearing, lips & tongue, mmm; poor/absent dentition Neck:  no LAD, masses or thyromegaly Cardiovascular:  RRR, no m/r/g. 1+ LE edema.  Respiratory:   CTA bilaterally with no wheezes/rales/rhonchi.  Normal respiratory effort. Abdomen:  soft, NT, ND Back:   normal alignment, no CVAT Skin:  no rash or induration seen on limited exam; he does have an isolated 2 cm mildly erythematous nodule on the top of his head that spares hair growth    Musculoskeletal:  grossly normal tone BUE/BLE, good ROM, no bony abnormality Psychiatric: blunted mood and affect, speech fluent and appropriate, AOx3 Neurologic:  CN 2-12 grossly intact, moves all extremities in coordinated fashion   Radiological Exams on Admission: Independently reviewed - see discussion in A/P where applicable  CT Head Wo Contrast  Result Date: 09/11/2021 CLINICAL DATA:  Headaches. EXAM: CT HEAD WITHOUT CONTRAST TECHNIQUE: Contiguous axial images were obtained from the base of the skull through the vertex without intravenous contrast. RADIATION DOSE REDUCTION: This exam was performed according to the departmental dose-optimization program which includes automated exposure control, adjustment of the mA and/or kV according to patient size and/or use of iterative reconstruction technique. COMPARISON:  03/12/2018 FINDINGS: Brain: No evidence of acute infarction, hemorrhage, hydrocephalus, extra-axial collection or mass lesion/mass effect. Extensive dural calcification in thickening is again identified and appears similar to the previous exam. Vascular: No hyperdense vessel or unexpected calcification. Skull: Normal. Negative for fracture or focal  lesion. Sinuses/Orbits: No acute finding. Other: None IMPRESSION: 1. No acute intracranial abnormalities. No significant change from prior exam. Electronically Signed   By: Kerby Moors M.D.   On: 09/11/2021 06:29    EKG: Independently reviewed.  NSR with rate 74; LVH, RBBB; no evidence of acute ischemia   Labs on Admission: I have personally reviewed the available labs and imaging studies at the time of the admission.  Pertinent labs:    BUN 63/Creatinine 12.92/GFR 4 Albumin 3.1 Hgb 11, 11.8; 8.8 in 11/2020 Heme positive   Assessment and Plan: Principal Problem:   Acute GI bleeding Active Problems:   HLD (hyperlipidemia)   Refusal of blood transfusions as patient is Jehovah's Witness   Headache disorder   ESRD on hemodialysis ([place])   Obstructive sleep apnea    GI Bleed -Patient with h/o upper GI bleeding from gastritis/esophagitis presenting with 2 episodes of painless maroon stools yesterday  -Most likely etiology appears to be lower GI bleeding but this is not entirely clear -He did have a prior colonoscopy but with poor prep in October, likely needs repeating with possible upper GI too -Differential to include bleeding hemorrhoids; bacterial infectious colitis/esophagitis/gastritis; colonic diverticula; and AVM  -He is afebrile at this time without tachycardia, and no leukocytosis; will not give antibiotics at this time.  -Will place in observation  -Continue to monitor for recurrent bleeding -GI consult has been requested -If negative EGD/colonoscopy, small bowel bleeding may be suspected and capsule endoscopy is the most reasonable 1st step for further evaluation. -Will give Protonix 40 mg IV BID for now -Clear liquids, NPO after MN -Situation is complicated by patient's inability to accept blood products and chronic anemia associated with ESRD  Migraines -He has periodic headaches and taken prn Topamax -He does report several doses of ASA for the headache but denies  taking NSAIDs (although he has them at home) -Will continue prn topamax  ESRD on  HD -Patient on chronic MWF HD, was due today but missed because he came to the ER -Nephrology prn order set utilized -He does not appear to be volume overloaded or otherwise in need of acute HD -Nephrology was notified that patient will need HD  -Continue Sensipar, Lokelma, Renavite, Sevelamer  HTN -He appears to no longer be taking medications for this issue  Scalp lesion -Lack of hair growth and pruritic nature may indicate fungal cause -Appears to need further outpatient derm evaluation  HLD -Continue Lipitor  OSA -Does not wear CPAP    Advance Care Planning:   Code Status: Full Code   Consults: GI; nephrology  DVT Prophylaxis: SCDs  Family Communication: None present; he will update family  Severity of Illness: The appropriate patient status for this patient is OBSERVATION. Observation status is judged to be reasonable and necessary in order to provide the required intensity of service to ensure the patient's safety. The patient's presenting symptoms, physical exam findings, and initial radiographic and laboratory data in the context of their medical condition is felt to place them at decreased risk for further clinical deterioration. Furthermore, it is anticipated that the patient will be medically stable for discharge from the hospital within 2 midnights of admission.   Author: Karmen Bongo, MD 09/11/2021 2:41 PM  For on call review [email or web address].

## 2021-09-12 ENCOUNTER — Telehealth: Payer: Self-pay | Admitting: Adult Health

## 2021-09-12 DIAGNOSIS — K922 Gastrointestinal hemorrhage, unspecified: Secondary | ICD-10-CM | POA: Diagnosis not present

## 2021-09-12 DIAGNOSIS — K2950 Unspecified chronic gastritis without bleeding: Secondary | ICD-10-CM | POA: Diagnosis not present

## 2021-09-12 LAB — RENAL FUNCTION PANEL
Albumin: 2.6 g/dL — ABNORMAL LOW (ref 3.5–5.0)
Anion gap: 15 (ref 5–15)
BUN: 30 mg/dL — ABNORMAL HIGH (ref 8–23)
CO2: 26 mmol/L (ref 22–32)
Calcium: 9 mg/dL (ref 8.9–10.3)
Chloride: 93 mmol/L — ABNORMAL LOW (ref 98–111)
Creatinine, Ser: 7.12 mg/dL — ABNORMAL HIGH (ref 0.61–1.24)
GFR, Estimated: 8 mL/min — ABNORMAL LOW (ref >60)
Glucose, Bld: 79 mg/dL (ref 70–99)
Phosphorus: 3.4 mg/dL (ref 2.5–4.6)
Potassium: 3.4 mmol/L — ABNORMAL LOW (ref 3.5–5.1)
Sodium: 134 mmol/L — ABNORMAL LOW (ref 135–145)

## 2021-09-12 LAB — CBC
HCT: 35.4 % — ABNORMAL LOW (ref 39.0–52.0)
Hemoglobin: 11.3 g/dL — ABNORMAL LOW (ref 13.0–17.0)
MCH: 28.5 pg (ref 26.0–34.0)
MCHC: 31.9 g/dL (ref 30.0–36.0)
MCV: 89.4 fL (ref 80.0–100.0)
Platelets: 277 10*3/uL (ref 150–400)
RBC: 3.96 MIL/uL — ABNORMAL LOW (ref 4.22–5.81)
RDW: 16.6 % — ABNORMAL HIGH (ref 11.5–15.5)
WBC: 6.1 10*3/uL (ref 4.0–10.5)
nRBC: 0 % (ref 0.0–0.2)

## 2021-09-12 LAB — NO BLOOD PRODUCTS

## 2021-09-12 MED ORDER — CHLORHEXIDINE GLUCONATE CLOTH 2 % EX PADS
6.0000 | MEDICATED_PAD | Freq: Every day | CUTANEOUS | Status: DC
Start: 2021-09-13 — End: 2021-09-13
  Administered 2021-09-13: 6 via TOPICAL

## 2021-09-12 MED ORDER — LIDOCAINE-PRILOCAINE 2.5-2.5 % EX CREA: 1.0000 | TOPICAL_CREAM | CUTANEOUS | Status: DC | PRN

## 2021-09-12 MED ORDER — CALCITRIOL 0.5 MCG PO CAPS
1.7500 ug | ORAL_CAPSULE | ORAL | Status: DC
  Filled 2021-09-12: qty 1

## 2021-09-12 MED ORDER — PENTAFLUOROPROP-TETRAFLUOROETH EX AERO: 1.0000 | INHALATION_SPRAY | CUTANEOUS | Status: DC | PRN

## 2021-09-12 MED ORDER — TRAMADOL HCL 50 MG PO TABS
50.0000 mg | ORAL_TABLET | Freq: Three times a day (TID) | ORAL | Status: DC | PRN
  Administered 2021-09-12 – 2021-09-13 (×3): 50 mg via ORAL
  Filled 2021-09-12 (×3): qty 1

## 2021-09-12 MED ORDER — BISACODYL 5 MG PO TBEC
20.0000 mg | DELAYED_RELEASE_TABLET | Freq: Once | ORAL | Status: AC
Start: 2021-09-12 — End: 2021-09-12
  Administered 2021-09-12: 20 mg via ORAL
  Filled 2021-09-12: qty 4

## 2021-09-12 MED ORDER — LIDOCAINE HCL (PF) 1 % IJ SOLN: 5.0000 mL | INTRAMUSCULAR | Status: DC | PRN

## 2021-09-12 NOTE — Anesthesia Preprocedure Evaluation (Addendum)
Anesthesia Evaluation  Patient identified by MRN, date of birth, ID band Patient awake    Reviewed: Allergy & Precautions, NPO status , Patient's Chart, lab work & pertinent test results  History of Anesthesia Complications Negative for: history of anesthetic complications  Airway Mallampati: II  TM Distance: >3 FB Neck ROM: Full    Dental no notable dental hx.    Pulmonary sleep apnea , former smoker,    Pulmonary exam normal        Cardiovascular hypertension, + Peripheral Vascular Disease  Normal cardiovascular exam     Neuro/Psych  Headaches, Seizures -, Well Controlled,  Anxiety Depression CVA, No Residual Symptoms    GI/Hepatic Neg liver ROS, PUD, GERD  ,Suspected GIB   Endo/Other  diabetes, Type 2  Renal/GU Dialysis and ESRFRenal disease (HD M/W/F)  negative genitourinary   Musculoskeletal negative musculoskeletal ROS (+)   Abdominal   Peds  Hematology  (+) Blood dyscrasia (Hgb 11.3), anemia , REFUSES BLOOD PRODUCTS, JEHOVAH'S WITNESS  Anesthesia Other Findings Day of surgery medications reviewed with patient.  Reproductive/Obstetrics negative OB ROS                            Anesthesia Physical Anesthesia Plan  ASA: 4  Anesthesia Plan: MAC   Post-op Pain Management: Minimal or no pain anticipated   Induction:   PONV Risk Score and Plan: Propofol infusion and Treatment may vary due to age or medical condition  Airway Management Planned: Natural Airway and Nasal Cannula  Additional Equipment: None  Intra-op Plan:   Post-operative Plan:   Informed Consent: I have reviewed the patients History and Physical, chart, labs and discussed the procedure including the risks, benefits and alternatives for the proposed anesthesia with the patient or authorized representative who has indicated his/her understanding and acceptance.       Plan Discussed with: CRNA  Anesthesia  Plan Comments: (Patient refuses all whole blood products even if bleeding/anemia is likely to result in death or permanent disability. Will accept albumin. Stephannie Peters, MD)       Anesthesia Quick Evaluation

## 2021-09-12 NOTE — Progress Notes (Signed)
Received patient in bed to unit.  Alert and oriented.  Informed consent signed and in  chart.   Treatment initiated: 0102H Treatment completed: 0511H  Patient tolerated well.  Transported back to the room  alert, without acute distress.  Hand-off given to patient's nurse.   Access used: AVF Access issues: None  Total UF removed: 0  Medication(s) given: Tylenol '650mg'$   Post HD VS: BP 104/7mHg, HR 82bpm, Sats 98% on RA, RR 11Brpm  Post HD weight: 87.3kg   [facility]

## 2021-09-12 NOTE — Progress Notes (Signed)
  Because of delay in yesterday's dialysis that did not happen until yesterday evening, the patient was not able to start or complete the bowel prep.  He had not had any bowel movements and has not passed any more blood. Therefore the colonoscopy/Endo plan for today has been rescheduled for tomorrow. I just spoke with the nurse.  Resume his clears.  Needs to be n.p.o. after 5 AM tomorrow.  RN is going to asked the pharmacy to transpose the same exact prep orders that were written for yesterday evening to be initiated this evening.  Have ordered Dulcolax 20 mg.  Did not examine patient today.  Vital signs reviewed.  Blood pressure soft, no tachycardia, no hypoxia.  Sodium is 134.  Potassium 3.4.  Hb 11.3.  Assessment/plan Limited hematochezia.  Prior history presumed diverticular bleed.  History gastric polyps, gastric ulcers, colon diverticulosis.  Previous resection for noncancerous near obstructing mass in 2017.  ESRD.  Hemodialysis MWF.  Anemia chronic disease.  Hgb stable, 11.3.  Azucena Freed, PA-C

## 2021-09-12 NOTE — Progress Notes (Signed)
[facility] NEPHROLOGY PROGRESS NOTE  Assessment/ Plan: Pt is a 61 y.o. yo male with ESRD on HD admitted with bright red blood per rectum, hemoglobin is stable.  OP HD orders: [facility] 3.75hrs 2K 3Ca 400/700 EDW 87.5kg No heparin Mircera 50mg q2wks last 7/26 Calcitriol 1.781m qHD  #Bright red blood per rectum concerning for lower GI bleed: Hemoglobin is stable.  Plan for colonoscopy and possible endoscopy today per GI.  # ESRD MWF schedule, status post dialysis yesterday, tolerated well.  Regular HD tomorrow.  # Anemia: Hemoglobin at goal.  Work-up for GI bleed as above.  Received Mircera on 7/26.  # Secondary hyperparathyroidism: Calcium and phosphorus level acceptable.  Continue Sensipar, Renvela and resume calcitriol.  # HTN/volume: Blood pressure and volume status acceptable.  May need to assess dry weight.  He is not on antihypertensives.  Subjective: Seen and examined at bedside.  Denies further GI bleed.  No nausea, vomiting, chest pain, shortness of breath.  N.p.o. for GI procedure today.  Had dialysis yesterday without issue. Objective Vital signs in last 24 hours: Vitals:   09/12/21 0430 09/12/21 0511 09/12/21 0626 09/12/21 0725  BP: 121/75 116/77 104/79 95/69  Pulse: 98 87 100 97  Resp: _0 Temp:  97.7 F (36.5 C) 98.1 F (36.7 C) 98.1 F (36.7 C)  TempSrc:  Oral Oral Oral  SpO2: 99% 99% 99% 100%  Weight:  87.3 kg    Height:       Weight change: 0 kg  Intake/Output Summary (Last 24 hours) at 09/12/2021 1120 Last data filed at 09/12/2021 0511 Gross per 24 hour  Intake --  Output 0 ml  Net 0 ml       Labs: RENAL PANEL Recent Labs    11/10/20 0930 11/14/20 1431 11/15/20 0033 11/16/20 0850 11/18/20 0800 11/21/20 0153 11/23/20 0903 11/24/20 0242 09/11/21 0424 09/12/21 0635  NA 138 137 135 132* 133* 131* 133* 133* 134* 134*  K 3.5 3.8 4.8 4.5 4.4 4.7 3.2* 4.1 4.8 3.4*  CL 98 98 97* 94* 95* 94* 95* 94* 93* 93*  CO2 26  --   _1 21* 26  GLUCOSE 149* 70 103* 88 80 90 123* 95 95 79  BUN 28* 29* 34* 61* 43* 52* 24* 26* 63* 30*  CREATININE 7.22* 7.50* 7.64* 9.55* 9.07* 10.00* 4.78* 5.60* 12.92* 7.12*  CALCIUM 9.6  --  8.0* 7.7* 8.2* 8.2* 8.4* 8.3* 9.5 9.0  PHOS  --   --   --  4.6 4.2 4.1 2.2* 3.3  --  3.4  ALBUMIN 2.7*  --   --  2.2* 2.1* 1.9* 2.1* 1.9* 3.1* 2.6*     Liver Function Tests: Recent Labs  Lab 09/11/21 0424 09/12/21 0635  AST 18  --   ALT 14  --   ALKPHOS 77  --   BILITOT 0.3  --   PROT 8.1  --   ALBUMIN 3.1* 2.6*   No results for input(s): "LIPASE", "AMYLASE" in the last 168 hours. No results for input(s): "AMMONIA" in the last 168 hours. CBC: Recent Labs    11/16/20 2118 11/17/20 1345 11/24/20 0242 11/25/20 0259 09/11/21 0424 09/11/21 1015 09/12/21 0635  HGB 7.4*   < > 7.6* 8.8* 11.0* 11.8* 11.3*  MCV 87.3   < > 89.1 90.2 91.1 93.0 89.4  FERRITIN 1,472*  --   --   --   --   --   --  TIBC 157*  --   --   --   --   --   --   IRON 23*  --   --   --   --   --   --   RETICCTPCT 1.1  --   --   --   --   --   --    < > = values in this interval not displayed.    Cardiac Enzymes: No results for input(s): "CKTOTAL", "CKMB", "CKMBINDEX", "TROPONINI" in the last 168 hours. CBG: No results for input(s): "GLUCAP" in the last 168 hours.  Iron Studies: No results for input(s): "IRON", "TIBC", "TRANSFERRIN", "FERRITIN" in the last 72 hours. Studies/Results: CT Head Wo Contrast  Result Date: 09/11/2021 CLINICAL DATA:  Headaches. EXAM: CT HEAD WITHOUT CONTRAST TECHNIQUE: Contiguous axial images were obtained from the base of the skull through the vertex without intravenous contrast. RADIATION DOSE REDUCTION: This exam was performed according to the departmental dose-optimization program which includes automated exposure control, adjustment of the mA and/or kV according to patient size and/or use of iterative reconstruction technique. COMPARISON:  03/12/2018 FINDINGS: Brain: No  evidence of acute infarction, hemorrhage, hydrocephalus, extra-axial collection or mass lesion/mass effect. Extensive dural calcification in thickening is again identified and appears similar to the previous exam. Vascular: No hyperdense vessel or unexpected calcification. Skull: Normal. Negative for fracture or focal lesion. Sinuses/Orbits: No acute finding. Other: None IMPRESSION: 1. No acute intracranial abnormalities. No significant change from prior exam. Electronically Signed   By: Kerby Moors M.D.   On: 09/11/2021 06:29    Medications: Infusions:   Scheduled Medications:  Chlorhexidine Gluconate Cloth  6 each Topical Q0600   cinacalcet  120 mg Oral Q supper   gabapentin  100 mg Oral QHS   multivitamin  1 tablet Oral Daily   pantoprazole (PROTONIX) IV  40 mg Intravenous Q12H   peg 3350 powder  0.5 kit Oral Once   And   peg 3350 powder  0.5 kit Oral Once   sevelamer carbonate  4,000 mg Oral TID WC    have reviewed scheduled and prn medications.  Physical Exam: General:NAD, comfortable Heart:RRR, s1s2 nl Lungs:clear b/l, no crackle Abdomen:soft, Non-tender, non-distended Extremities:No edema Dialysis Access: AV fistula  Danelia Snodgrass Tanna Furry 09/12/2021,11:20 AM  LOS: 0 days

## 2021-09-12 NOTE — Telephone Encounter (Signed)
Contacted pt back, informed him he was prescribed  topiramate (TOPAMAX) 25 MG tablet Take 1 tablet (25 mg total) by mouth 2 (two) times daily  Stated nurse at hospital acted like she couldn't see it or wasn't aware of his meds. Also informed him he could get access to it via mychart, pt was very appreciative.

## 2021-09-12 NOTE — Telephone Encounter (Signed)
Pt admitted to [facility]. Pt would like a call from the nurse to discuss what the neurologist was giving him for headaches.

## 2021-09-12 NOTE — Progress Notes (Signed)
PROGRESS NOTE                                                                                                                                                                                                             Patient Demographics:    [name], is a 61 y.o. male, DOB - 1960-12-27, NOI:[id number]  Outpatient Primary MD for the patient is Koirala, Dibas, MD    LOS - 0  Admit date - 09/11/2021    Chief Complaint  Patient presents with   Melena   Headache       Brief Narrative (HPI from H&P)    61 y.o. male with medical history significant of GI bleeding with refusal of blood products; ESRD on MWF HD; HTN; and OSA not on CPAP presenting with GI bleeding.  Prior colonoscopy was on 11/25/2020 with diverticulosis and non-bleeding internal hemorrhoids.  EGD was a week prior on 10/7 and showed esophagitis dessicans with pathology c/w acute esophagitis, a 3cm hiatal hernia, a non-bleeding gastric ulcer with adherent clot treated with bipolar cautery with pathology c/w chronic active gastritis, and non-bleeding duodenal diverticulum with mucosal congestion with pathology c/w peptic duodenitis.  He reports that he has been having headache (has known migraines) and took several ASA and Topamax without improvement, denies NSAID use although he acknowledges having some at home just in case.  He had 2 episodes of dark maroon stools without pain, came to the hospital for evaluation of bright red blood per rectum and was admitted for further evaluation and work-up.   Subjective:    [name] today has, No headache, No chest pain, No abdominal pain - No Nausea, No new weakness tingling or numbness, no SOB, no further blood per rectum.   Assessment  & Plan :   Bright red blood per rectum with lower GI bleed.  So far H&H relatively stable, GI on board, question if this is diverticular versus hemorrhoidal bleed.  Continue to  monitor H&H, he is Jehovah's Witness and will not take transfusion even if anemia causes death, continue n.p.o. IV PPI, EGD colonoscopy later today.  History of migraines.  Counseled to avoid aspirin and NSAID use, continue as needed Topamax.  ESRD.  On MWF schedule.  Nephrology on board.  Hypertension.  Currently stable off of medications.  Dyslipidemia.  On Lipitor.  SA.  Does not wear CPAP at night.  Monitor.  Recent history of occipital scalp skin rash and nodule -  outpatient dermatology follow-up to be arranged by PCP.      Condition - Fair  Family Communication  :  None  Code Status :  Full  Consults  : GI, nephrology  PUD Prophylaxis : PPI   Procedures  :     CT head.  Nonacute.    EGD.    Colonoscopy.      Disposition Plan  :    Status is: Observation  DVT Prophylaxis  :    SCDs Start: 09/11/21 1332    Lab Results  Component Value Date   PLT 277 09/12/2021    Diet :  Diet Order             Diet NPO time specified  Diet effective 0500 tomorrow                    Inpatient Medications  Scheduled Meds:  Chlorhexidine Gluconate Cloth  6 each Topical Q0600   cinacalcet  120 mg Oral Q supper   gabapentin  100 mg Oral QHS   multivitamin  1 tablet Oral Daily   pantoprazole (PROTONIX) IV  40 mg Intravenous Q12H   peg 3350 powder  0.5 kit Oral Once   And   peg 3350 powder  0.5 kit Oral Once   sevelamer carbonate  4,000 mg Oral TID WC   sodium zirconium cyclosilicate  10 g Oral Daily   Continuous Infusions: PRN Meds:.acetaminophen **OR** acetaminophen, calcium carbonate (dosed in mg elemental calcium), camphor-menthol **AND** hydrOXYzine, cromolyn, docusate sodium, feeding supplement (NEPRO CARB STEADY), hydrALAZINE, lidocaine (PF), lidocaine-prilocaine, ondansetron **OR** ondansetron (ZOFRAN) IV, pentafluoroprop-tetrafluoroeth, sorbitol, zolpidem  Antibiotics  :    Anti-infectives (From admission, onward)    None        Time  Spent in minutes  30   [name] M.D on 09/12/2021 at 8:21 AM  To page go to [email or web address]   [facility] -  Office  [phone]  See all Orders from today for further details    Objective:   Vitals:   09/12/21 0430 09/12/21 0511 09/12/21 0626 09/12/21 0725  BP: 121/75 116/77 104/79 95/69  Pulse: 98 87 100 97  Resp: '17 16 16   ' Temp:  97.7 F (36.5 C) 98.1 F (36.7 C) 98.1 F (36.7 C)  TempSrc:  Oral Oral Oral  SpO2: 99% 99% 99% 100%  Weight:  87.3 kg    Height:        Wt Readings from Last 3 Encounters:  09/12/21 87.3 kg  07/03/21 90.1 kg  03/09/21 88.5 kg     Intake/Output Summary (Last 24 hours) at 09/12/2021 0821 Last data filed at 09/12/2021 0511 Gross per 24 hour  Intake --  Output 0 ml  Net 0 ml     Physical Exam  Awake Alert, No new F.N deficits, Normal affect [place].AT,PERRAL Supple Neck, No JVD,   Symmetrical Chest wall movement, Good air movement bilaterally, CTAB RRR,No Gallops,Rubs or new Murmurs,  +ve B.Sounds, Abd Soft, No tenderness,   No Cyanosis, Clubbing or edema      Data Review:    CBC Recent Labs  Lab 09/11/21 0424 09/11/21 1015 09/12/21 0635  WBC 7.9 7.3 6.1  HGB 11.0* 11.8* 11.3*  HCT 35.0* 38.6* 35.4*  PLT 301 288 277  MCV 91.1 93.0 89.4  MCH 28.6 28.4 28.5  MCHC 31.4 30.6 31.9  RDW  16.8* 16.9* 16.6*  LYMPHSABS 1.0  --   --   MONOABS 0.7  --   --   EOSABS 0.3  --   --   BASOSABS 0.1  --   --     Electrolytes Recent Labs  Lab 09/11/21 0424 09/12/21 0635  NA 134* 134*  K 4.8 3.4*  CL 93* 93*  CO2 21* 26  GLUCOSE 95 79  BUN 63* 30*  CREATININE 12.92* 7.12*  CALCIUM 9.5 9.0  AST 18  --   ALT 14  --   ALKPHOS 77  --   BILITOT 0.3  --   ALBUMIN 3.1* 2.6*  INR 1.1  --     ------------------------------------------------------------------------------------------------------------------ No results for input(s): "CHOL", "HDL", "LDLCALC", "TRIG", "CHOLHDL", "LDLDIRECT" in the last 72 hours.  Lab  Results  Component Value Date   HGBA1C 4.8 11/15/2020    Radiology Reports CT Head Wo Contrast  Result Date: 09/11/2021 CLINICAL DATA:  Headaches. EXAM: CT HEAD WITHOUT CONTRAST TECHNIQUE: Contiguous axial images were obtained from the base of the skull through the vertex without intravenous contrast. RADIATION DOSE REDUCTION: This exam was performed according to the departmental dose-optimization program which includes automated exposure control, adjustment of the mA and/or kV according to patient size and/or use of iterative reconstruction technique. COMPARISON:  03/12/2018 FINDINGS: Brain: No evidence of acute infarction, hemorrhage, hydrocephalus, extra-axial collection or mass lesion/mass effect. Extensive dural calcification in thickening is again identified and appears similar to the previous exam. Vascular: No hyperdense vessel or unexpected calcification. Skull: Normal. Negative for fracture or focal lesion. Sinuses/Orbits: No acute finding. Other: None IMPRESSION: 1. No acute intracranial abnormalities. No significant change from prior exam. Electronically Signed   By: Kerby Moors M.D.   On: 09/11/2021 06:29

## 2021-09-12 NOTE — Care Management Obs Status (Signed)
[place] NOTIFICATION   Patient Details  Name: Carl Sinning Sr. MRN: [id number] Date of Birth: 01/23/1961   Medicare Observation Status Notification Given:  Yes    Curlene Labrum, RN 09/12/2021, 10:45 AM

## 2021-09-13 ENCOUNTER — Other Ambulatory Visit (HOSPITAL_COMMUNITY): Payer: Self-pay

## 2021-09-13 ENCOUNTER — Encounter (HOSPITAL_COMMUNITY): Payer: Self-pay | Admitting: Internal Medicine

## 2021-09-13 ENCOUNTER — Encounter (HOSPITAL_COMMUNITY)
Admission: EM | Disposition: A | Discharge status: Home / Self Care | Payer: Self-pay | Source: Home / Self Care | Attending: Emergency Medicine

## 2021-09-13 ENCOUNTER — Observation Stay (HOSPITAL_BASED_OUTPATIENT_CLINIC_OR_DEPARTMENT_OTHER): Payer: Medicare Other | Admitting: Anesthesiology

## 2021-09-13 ENCOUNTER — Observation Stay (HOSPITAL_COMMUNITY): Payer: Medicare Other

## 2021-09-13 ENCOUNTER — Observation Stay (HOSPITAL_COMMUNITY): Payer: Medicare Other | Admitting: Anesthesiology

## 2021-09-13 DIAGNOSIS — K2951 Unspecified chronic gastritis with bleeding: Secondary | ICD-10-CM | POA: Diagnosis not present

## 2021-09-13 DIAGNOSIS — K2950 Unspecified chronic gastritis without bleeding: Secondary | ICD-10-CM | POA: Diagnosis not present

## 2021-09-13 DIAGNOSIS — K55059 Acute (reversible) ischemia of intestine, part and extent unspecified: Secondary | ICD-10-CM | POA: Diagnosis not present

## 2021-09-13 DIAGNOSIS — D62 Acute posthemorrhagic anemia: Secondary | ICD-10-CM | POA: Diagnosis not present

## 2021-09-13 DIAGNOSIS — K2289 Other specified disease of esophagus: Secondary | ICD-10-CM | POA: Diagnosis not present

## 2021-09-13 DIAGNOSIS — K449 Diaphragmatic hernia without obstruction or gangrene: Secondary | ICD-10-CM

## 2021-09-13 DIAGNOSIS — Z8711 Personal history of peptic ulcer disease: Secondary | ICD-10-CM | POA: Diagnosis not present

## 2021-09-13 DIAGNOSIS — K2970 Gastritis, unspecified, without bleeding: Secondary | ICD-10-CM | POA: Diagnosis not present

## 2021-09-13 DIAGNOSIS — K633 Ulcer of intestine: Secondary | ICD-10-CM

## 2021-09-13 DIAGNOSIS — K274 Chronic or unspecified peptic ulcer, site unspecified, with hemorrhage: Secondary | ICD-10-CM | POA: Diagnosis not present

## 2021-09-13 DIAGNOSIS — K3189 Other diseases of stomach and duodenum: Secondary | ICD-10-CM | POA: Diagnosis not present

## 2021-09-13 DIAGNOSIS — K559 Vascular disorder of intestine, unspecified: Secondary | ICD-10-CM | POA: Diagnosis not present

## 2021-09-13 DIAGNOSIS — K2991 Gastroduodenitis, unspecified, with bleeding: Secondary | ICD-10-CM | POA: Diagnosis not present

## 2021-09-13 DIAGNOSIS — K641 Second degree hemorrhoids: Secondary | ICD-10-CM | POA: Diagnosis not present

## 2021-09-13 DIAGNOSIS — K5731 Diverticulosis of large intestine without perforation or abscess with bleeding: Secondary | ICD-10-CM | POA: Diagnosis not present

## 2021-09-13 DIAGNOSIS — G4730 Sleep apnea, unspecified: Secondary | ICD-10-CM | POA: Diagnosis not present

## 2021-09-13 DIAGNOSIS — K269 Duodenal ulcer, unspecified as acute or chronic, without hemorrhage or perforation: Secondary | ICD-10-CM

## 2021-09-13 DIAGNOSIS — Z8719 Personal history of other diseases of the digestive system: Secondary | ICD-10-CM | POA: Diagnosis not present

## 2021-09-13 DIAGNOSIS — K922 Gastrointestinal hemorrhage, unspecified: Secondary | ICD-10-CM | POA: Diagnosis not present

## 2021-09-13 DIAGNOSIS — K5790 Diverticulosis of intestine, part unspecified, without perforation or abscess without bleeding: Secondary | ICD-10-CM | POA: Diagnosis not present

## 2021-09-13 DIAGNOSIS — K279 Peptic ulcer, site unspecified, unspecified as acute or chronic, without hemorrhage or perforation: Secondary | ICD-10-CM | POA: Diagnosis not present

## 2021-09-13 DIAGNOSIS — N250 Renal osteodystrophy: Secondary | ICD-10-CM | POA: Diagnosis not present

## 2021-09-13 DIAGNOSIS — Z531 Procedure and treatment not carried out because of patient's decision for reasons of belief and group pressure: Secondary | ICD-10-CM | POA: Diagnosis not present

## 2021-09-13 DIAGNOSIS — K625 Hemorrhage of anus and rectum: Secondary | ICD-10-CM | POA: Diagnosis not present

## 2021-09-13 HISTORY — PX: ESOPHAGOGASTRODUODENOSCOPY (EGD) WITH PROPOFOL: SHX5813

## 2021-09-13 HISTORY — PX: COLONOSCOPY: SHX5424

## 2021-09-13 HISTORY — PX: BIOPSY: SHX5522

## 2021-09-13 LAB — CBC
HCT: 33.4 % — ABNORMAL LOW (ref 39.0–52.0)
Hemoglobin: 10.8 g/dL — ABNORMAL LOW (ref 13.0–17.0)
MCH: 28.7 pg (ref 26.0–34.0)
MCHC: 32.3 g/dL (ref 30.0–36.0)
MCV: 88.8 fL (ref 80.0–100.0)
Platelets: 289 10*3/uL (ref 150–400)
RBC: 3.76 MIL/uL — ABNORMAL LOW (ref 4.22–5.81)
RDW: 16.7 % — ABNORMAL HIGH (ref 11.5–15.5)
WBC: 5.6 10*3/uL (ref 4.0–10.5)
nRBC: 0 % (ref 0.0–0.2)

## 2021-09-13 LAB — RENAL FUNCTION PANEL
Albumin: 2.7 g/dL — ABNORMAL LOW (ref 3.5–5.0)
Anion gap: 16 — ABNORMAL HIGH (ref 5–15)
BUN: 40 mg/dL — ABNORMAL HIGH (ref 8–23)
CO2: 23 mmol/L (ref 22–32)
Calcium: 9 mg/dL (ref 8.9–10.3)
Chloride: 97 mmol/L — ABNORMAL LOW (ref 98–111)
Creatinine, Ser: 10.71 mg/dL — ABNORMAL HIGH (ref 0.61–1.24)
GFR, Estimated: 5 mL/min — ABNORMAL LOW (ref >60)
Glucose, Bld: 84 mg/dL (ref 70–99)
Phosphorus: 6.1 mg/dL — ABNORMAL HIGH (ref 2.5–4.6)
Potassium: 4.8 mmol/L (ref 3.5–5.1)
Sodium: 136 mmol/L (ref 135–145)

## 2021-09-13 LAB — HEPATITIS B SURFACE ANTIBODY, QUANTITATIVE: Hep B S AB Quant (Post): 55.9 m[IU]/mL (ref >9.9)

## 2021-09-13 SURGERY — ESOPHAGOGASTRODUODENOSCOPY (EGD) WITH PROPOFOL
Anesthesia: Monitor Anesthesia Care

## 2021-09-13 MED ORDER — HEPARIN SODIUM (PORCINE) 1000 UNIT/ML DIALYSIS: 1000.0000 [IU] | INTRAMUSCULAR | Status: DC | PRN

## 2021-09-13 MED ORDER — SODIUM CHLORIDE 0.9 % IV SOLN: INTRAVENOUS | Status: DC | PRN

## 2021-09-13 MED ORDER — ALTEPLASE 2 MG IJ SOLR: 2.0000 mg | Freq: Once | INTRAMUSCULAR | Status: DC | PRN

## 2021-09-13 MED ORDER — PENTAFLUOROPROP-TETRAFLUOROETH EX AERO: 1.0000 | INHALATION_SPRAY | CUTANEOUS | Status: DC | PRN

## 2021-09-13 MED ORDER — PANTOPRAZOLE SODIUM 40 MG PO TBEC: 40.0000 mg | DELAYED_RELEASE_TABLET | Freq: Two times a day (BID) | ORAL | Status: DC

## 2021-09-13 MED ORDER — PROPOFOL 500 MG/50ML IV EMUL
INTRAVENOUS | Status: DC | PRN
  Administered 2021-09-13: 100 ug/kg/min via INTRAVENOUS

## 2021-09-13 MED ORDER — LIDOCAINE HCL (PF) 1 % IJ SOLN: 5.0000 mL | INTRAMUSCULAR | Status: DC | PRN

## 2021-09-13 MED ORDER — IOHEXOL 350 MG/ML SOLN
100.0000 mL | Freq: Once | INTRAVENOUS | Status: AC | PRN
  Administered 2021-09-13: 100 mL via INTRAVENOUS

## 2021-09-13 MED ORDER — LIDOCAINE 2% (20 MG/ML) 5 ML SYRINGE
INTRAMUSCULAR | Status: DC | PRN
  Administered 2021-09-13: 60 mg via INTRAVENOUS

## 2021-09-13 MED ORDER — ANTICOAGULANT SODIUM CITRATE 4% (200MG/5ML) IV SOLN: 5.0000 mL | Status: DC | PRN

## 2021-09-13 MED ORDER — SUCRALFATE 1 GM/10ML PO SUSP
1.0000 g | Freq: Two times a day (BID) | ORAL | 0 refills | Status: DC
  Filled 2021-09-13: qty 250, 13d supply, fill #0

## 2021-09-13 MED ORDER — LIDOCAINE-PRILOCAINE 2.5-2.5 % EX CREA: 1.0000 | TOPICAL_CREAM | CUTANEOUS | Status: DC | PRN

## 2021-09-13 MED ORDER — SUCRALFATE 1 GM/10ML PO SUSP
1.0000 g | Freq: Two times a day (BID) | ORAL | Status: DC
  Administered 2021-09-13: 1 g via ORAL
  Filled 2021-09-13: qty 10

## 2021-09-13 MED ORDER — PANTOPRAZOLE SODIUM 40 MG PO TBEC
40.0000 mg | DELAYED_RELEASE_TABLET | Freq: Two times a day (BID) | ORAL | 1 refills | Status: DC
  Filled 2021-09-13: qty 60, 30d supply, fill #0

## 2021-09-13 MED ORDER — PHENYLEPHRINE HCL-NACL 20-0.9 MG/250ML-% IV SOLN
INTRAVENOUS | Status: DC | PRN
  Administered 2021-09-13: 30 ug/min via INTRAVENOUS

## 2021-09-13 SURGICAL SUPPLY — 15 items

## 2021-09-13 NOTE — Progress Notes (Signed)
PROGRESS NOTE                                                                                                                                                                                                             Patient Demographics:    [name], is a 61 y.o. male, DOB - 1960/12/28, IHW:[id number]  Outpatient Primary MD for the patient is Koirala, Dibas, MD    LOS - 0  Admit date - 09/11/2021    Chief Complaint  Patient presents with   Melena   Headache       Brief Narrative (HPI from H&P)    61 y.o. male with medical history significant of GI bleeding with refusal of blood products; ESRD on MWF HD; HTN; and OSA not on CPAP presenting with GI bleeding.  Prior colonoscopy was on 11/25/2020 with diverticulosis and non-bleeding internal hemorrhoids.  EGD was a week prior on 10/7 and showed esophagitis dessicans with pathology c/w acute esophagitis, a 3cm hiatal hernia, a non-bleeding gastric ulcer with adherent clot treated with bipolar cautery with pathology c/w chronic active gastritis, and non-bleeding duodenal diverticulum with mucosal congestion with pathology c/w peptic duodenitis.  He reports that he has been having headache (has known migraines) and took several ASA and Topamax without improvement, denies NSAID use although he acknowledges having some at home just in case.  He had 2 episodes of dark maroon stools without pain, came to the hospital for evaluation of bright red blood per rectum and was admitted for further evaluation and work-up.   Subjective:   Patient in bed, appears comfortable, denies any headache, no fever, no chest pain or pressure, no shortness of breath , no abdominal pain. No new focal weakness.   Assessment  & Plan :   Bright red blood per rectum with lower GI bleed.  So far H&H relatively stable, GI on board, question if this is diverticular versus hemorrhoidal bleed.  Continue to  monitor H&H, he is Jehovah's Witness and will not take transfusion even if anemia causes death, he underwent EGD colonoscopy on 09/13/2021, colonoscopy suspicious for ischemic colitis, CT abdomen pelvis with IV contrast ordered.  EGD did show gastric and duodenal ulcers but no signs of active bleeding, per GI is lower GI bleed was most likely due to ischemic colitis.  Monitor closely on PPI and  gradually advance diet.  History of migraines.  Counseled to avoid aspirin and NSAID use, continue as needed Topamax.  ESRD.  On MWF schedule.  Nephrology on board.  Case discussed with nephrology.  Hypertension.  Currently stable off of medications.  Dyslipidemia.  On Lipitor.  SA.  Does not wear CPAP at night.  Monitor.  Recent history of occipital scalp skin rash and nodule -  outpatient dermatology follow-up to be arranged by PCP.      Condition - Fair  Family Communication  :  None  Code Status :  Full  Consults  : GI, nephrology  PUD Prophylaxis : PPI   Procedures  :     CT head.  Nonacute.    EGD.    Impression:               - No gross lesions in esophagus. Z-line irregular,                            39 cm from the incisors.                           - 2 cm hiatal hernia.                           - Non-bleeding gastric ulcers with a clean ulcer                            base (Forrest Class III) in antrum. Gastritis                            throughout - biopsied.                           - Non-bleeding duodenal ulcers with a clean ulcer                            base (Forrest Class III) in sweep. Duodenitis                            throughout proximal duodenum - biopsied. Recommendation:           - Proceed to scheduled colonoscopy.                           - Observe patient's clinical course.                           - Await pathology results.                           - Transition IV PPI to PO PPI 40 mg twice daily.                           - Recommend Carafate  QAC + QHS, though Nephrology                            may not want this amount to be given, so at  least                            twice daily (this is what I Rx today).                           - Repeat upper endoscopy in 4 months to check                            healing.                           - Minimize NSAIDs if at all in future.                           - The findings and recommendations were discussed                            with the patient.                           - The findings and recommendations were discussed                            with the referring physician.  Colonoscopy.  Impression:               - Hemorrhoids found on digital rectal exam.                           - Tortuous left colon - likely from diverticular                            burden.                           - Patent functional end-to-end ileo-colonic                            anastomosis, characterized by healthy appearing                            mucosa.                           - Diverticulosis in the entire examined colon                            (Let>Right burden).                           - Segmental severe mucosal changes were found in                            the transverse colon secondary to what appears to                            be more of an  ischemic colitis. Biopsied.                           - Normal mucosa in the left colon otherwise.                           - Non-bleeding non-thrombosed internal hemorrhoids. Recommendation:           - The patient will be observed post-procedure,                            until all discharge criteria are met.                           - Return patient to hospital ward for ongoing care.                           - Resume previous diet.                           - Patient has no evidence of leukocytosis and is                            feeling improved. Will hold off on empiric                            antibiotics to  decrease bacterial translocation, as                            it has already been multiple days since coming into                            hospital.                           - Minimize NSAIDs.                           - Minimize Hypotension.                           - Recommend a CTAP with IV contrast to evaluate the                            vasculature and ensure no evidence of significant                            stenosis/thrombus that could predispose patient to                            recurrent presentations (time with need for                            dialysis).                           -  Suspect that this is more of an ischemic colitis                            picture rather than diverticular bleed, though                            neither is impossible. He has significant findings                            on EGD as well, but BRBPR more likely from colonic                            source.                           - The findings and recommendations were discussed                            with the patient.                           - The findings and recommendations were discussed                            with the referring physician.      Disposition Plan  :    Status is: Observation  DVT Prophylaxis  :    SCDs Start: 09/11/21 1332    Lab Results  Component Value Date   PLT 289 09/13/2021    Diet :  Diet Order             Diet renal/carb modified with fluid restriction Diet-HS Snack? Nothing; Room service appropriate? Yes; Fluid consistency: Thin  Diet effective now                    Inpatient Medications  Scheduled Meds:  [MAR Hold] calcitRIOL  1.75 mcg Oral Q M,W,F   [MAR Hold] cinacalcet  120 mg Oral Q supper   [MAR Hold] gabapentin  100 mg Oral QHS   [MAR Hold] multivitamin  1 tablet Oral Daily   pantoprazole  40 mg Oral BID AC   [MAR Hold] peg 3350 powder  0.5 kit Oral Once   [MAR Hold] sevelamer carbonate  4,000 mg  Oral TID WC   sucralfate  1 g Oral BID   Continuous Infusions: PRN Meds:.[MAR Hold] acetaminophen **OR** [MAR Hold] acetaminophen, [MAR Hold] calcium carbonate (dosed in mg elemental calcium), [MAR Hold] camphor-menthol **AND** [MAR Hold] hydrOXYzine, [MAR Hold] cromolyn, [MAR Hold] docusate sodium, [MAR Hold] feeding supplement (NEPRO CARB STEADY), [MAR Hold] hydrALAZINE, [MAR Hold] lidocaine (PF), [MAR Hold] lidocaine-prilocaine, [MAR Hold] ondansetron **OR** [MAR Hold] ondansetron (ZOFRAN) IV, [MAR Hold] pentafluoroprop-tetrafluoroeth, [MAR Hold] sorbitol, [MAR Hold] traMADol, [MAR Hold] zolpidem  Antibiotics  :    Anti-infectives (From admission, onward)    None        Time Spent in minutes  30   [name] M.D on 09/13/2021 at 10:40 AM  To page go to [email or web address]   [facility] -  Office  [phone]  See all Orders from today for further details    Objective:   Vitals:  09/13/21 0710 09/13/21 0818 09/13/21 1002 09/13/21 1015  BP: 104/66 108/73 115/80 123/82  Pulse: 85 74  72  Resp: '19 14 15 12  ' Temp: 98.3 F (36.8 C) 97.8 F (36.6 C) 98 F (36.7 C)   TempSrc: Oral     SpO2: 100% 97% 96% 98%  Weight:  87.3 kg    Height:  '6\' 2"'  (1.88 m)      Wt Readings from Last 3 Encounters:  09/13/21 87.3 kg  07/03/21 90.1 kg  03/09/21 88.5 kg     Intake/Output Summary (Last 24 hours) at 09/13/2021 1040 Last data filed at 09/13/2021 1005 Gross per 24 hour  Intake 585 ml  Output --  Net 585 ml     Physical Exam  Awake Alert, No new F.N deficits, Normal affect [place].AT,PERRAL Supple Neck, No JVD,   Symmetrical Chest wall movement, Good air movement bilaterally, CTAB RRR,No Gallops, Rubs or new Murmurs,  +ve B.Sounds, Abd Soft, No tenderness,   No Cyanosis, Clubbing or edema     Data Review:    CBC Recent Labs  Lab 09/11/21 0424 09/11/21 1015 09/12/21 0635 09/13/21 0729  WBC 7.9 7.3 6.1 5.6  HGB 11.0* 11.8* 11.3* 10.8*  HCT 35.0* 38.6* 35.4*  33.4*  PLT 301 288 277 289  MCV 91.1 93.0 89.4 88.8  MCH 28.6 28.4 28.5 28.7  MCHC 31.4 30.6 31.9 32.3  RDW 16.8* 16.9* 16.6* 16.7*  LYMPHSABS 1.0  --   --   --   MONOABS 0.7  --   --   --   EOSABS 0.3  --   --   --   BASOSABS 0.1  --   --   --     Electrolytes Recent Labs  Lab 09/11/21 0424 09/12/21 0635 09/13/21 0729  NA 134* 134* 136  K 4.8 3.4* 4.8  CL 93* 93* 97*  CO2 21* 26 23  GLUCOSE 95 79 84  BUN 63* 30* 40*  CREATININE 12.92* 7.12* 10.71*  CALCIUM 9.5 9.0 9.0  AST 18  --   --   ALT 14  --   --   ALKPHOS 77  --   --   BILITOT 0.3  --   --   ALBUMIN 3.1* 2.6* 2.7*  INR 1.1  --   --     ------------------------------------------------------------------------------------------------------------------ No results for input(s): "CHOL", "HDL", "LDLCALC", "TRIG", "CHOLHDL", "LDLDIRECT" in the last 72 hours.  Lab Results  Component Value Date   HGBA1C 4.8 11/15/2020    Radiology Reports CT Head Wo Contrast  Result Date: 09/11/2021 CLINICAL DATA:  Headaches. EXAM: CT HEAD WITHOUT CONTRAST TECHNIQUE: Contiguous axial images were obtained from the base of the skull through the vertex without intravenous contrast. RADIATION DOSE REDUCTION: This exam was performed according to the departmental dose-optimization program which includes automated exposure control, adjustment of the mA and/or kV according to patient size and/or use of iterative reconstruction technique. COMPARISON:  03/12/2018 FINDINGS: Brain: No evidence of acute infarction, hemorrhage, hydrocephalus, extra-axial collection or mass lesion/mass effect. Extensive dural calcification in thickening is again identified and appears similar to the previous exam. Vascular: No hyperdense vessel or unexpected calcification. Skull: Normal. Negative for fracture or focal lesion. Sinuses/Orbits: No acute finding. Other: None IMPRESSION: 1. No acute intracranial abnormalities. No significant change from prior exam.  Electronically Signed   By: Kerby Moors M.D.   On: 09/11/2021 06:29

## 2021-09-13 NOTE — Op Note (Signed)
[facility] Patient Name: [name] Procedure Date : 09/13/2021 MRN: [id number] Attending MD: Justice Britain , MD Date of Birth: 1960-11-30 CSN: [id number] Age: 61 Admit Type: Inpatient Procedure:                Upper GI endoscopy Indications:              Acute post hemorrhagic anemia, Iron deficiency                            anemia, Melena, Occult blood in stool, Follow-up of                            gastric ulcer Providers:                Justice Britain, MD, Allayne Gitelman, RN, [facility]                            Technician, Technician Referring MD:             [facility], Lajuan Lines. Pyrtle, MD Medicines:                Monitored Anesthesia Care Complications:            No immediate complications. Estimated Blood Loss:     Estimated blood loss was minimal. Procedure:                Pre-Anesthesia Assessment:                           - Prior to the procedure, a History and Physical                            was performed, and patient medications and                            allergies were reviewed. The patient's tolerance of                            previous anesthesia was also reviewed. The risks                            and benefits of the procedure and the sedation                            options and risks were discussed with the patient.                            All questions were answered, and informed consent                            was obtained. Prior Anticoagulants: The patient has                            taken no previous anticoagulant or antiplatelet  agents except for aspirin. ASA Grade Assessment:                            III - A patient with severe systemic disease. After                            reviewing the risks and benefits, the patient was                            deemed in satisfactory condition to undergo the                            procedure.                           After  obtaining informed consent, the endoscope was                            passed under direct vision. Throughout the                            procedure, the patient's blood pressure, pulse, and                            oxygen saturations were monitored continuously. The                            [id number]-H190 ([id number]) Olympus endoscope was introduced                            through the mouth, and advanced to the second part                            of duodenum. The upper GI endoscopy was                            accomplished without difficulty. The patient                            tolerated the procedure. Scope In: Scope Out: Findings:      No gross lesions were noted in the entire esophagus.      The Z-line was irregular and was found 39 cm from the incisors.      A 2 cm hiatal hernia was present.      Multiple non-bleeding linear gastric ulcers with a clean ulcer base       (Forrest Class III) were found in the gastric antrum. The largest       lesions were 10-15 mm in largest dimension.      Segmental severe inflammation characterized by erosions, erythema and       granularity was found in the entire examined stomach. Biopsies were       taken with a cold forceps for histology and Helicobacter pylori testing.      Few non-bleeding superficial duodenal ulcers with a clean ulcer base       (Forrest  Class III) were found in the duodenal sweep. The largest lesion       was 8 mm in largest dimension.      Segmental severe inflammation characterized by erosions, erythema and       granularity was found in the duodenal bulb, in the first portion of the       duodenum and in the second portion of the duodenum. Biopsies were taken       with a cold forceps for histology. Impression:               - No gross lesions in esophagus. Z-line irregular,                            39 cm from the incisors.                           - 2 cm hiatal hernia.                           -  Non-bleeding gastric ulcers with a clean ulcer                            base (Forrest Class III) in antrum. Gastritis                            throughout - biopsied.                           - Non-bleeding duodenal ulcers with a clean ulcer                            base (Forrest Class III) in sweep. Duodenitis                            throughout proximal duodenum - biopsied. Recommendation:           - Proceed to scheduled colonoscopy.                           - Observe patient's clinical course.                           - Await pathology results.                           - Transition IV PPI to PO PPI 40 mg twice daily.                           - Recommend Carafate QAC + QHS, though Nephrology                            may not want this amount to be given, so at least                            twice daily (this is what I Rx today).                           -  Repeat upper endoscopy in 4 months to check                            healing.                           - Minimize NSAIDs if at all in future.                           - The findings and recommendations were discussed                            with the patient.                           - The findings and recommendations were discussed                            with the referring physician. Procedure Code(s):        --- Professional ---                           [id number], Esophagogastroduodenoscopy, flexible,                            transoral; with biopsy, single or multiple Diagnosis Code(s):        --- Professional ---                           K22.8, Other specified diseases of esophagus                           K44.9, Diaphragmatic hernia without obstruction or                            gangrene                           K29.70, Gastritis, unspecified, without bleeding                           K25.9, Gastric ulcer, unspecified as acute or                            chronic, without hemorrhage or  perforation                           K26.9, Duodenal ulcer, unspecified as acute or                            chronic, without hemorrhage or perforation                           K29.80, Duodenitis without bleeding                           D62, Acute posthemorrhagic anemia  D50.9, Iron deficiency anemia, unspecified                           K92.1, Melena (includes Hematochezia)                           R19.5, Other fecal abnormalities CPT copyright 2019 [employer]. All rights reserved. The codes documented in this report are preliminary and upon coder review may  be revised to meet current compliance requirements. Justice Britain, MD 09/13/2021 10:10:22 AM Number of Addenda: 0

## 2021-09-13 NOTE — Interval H&P Note (Signed)
History and Physical Interval Note:  09/13/2021 8:52 AM  [name]  has presented today for surgery, with the diagnosis of Evaluate for source of intestinal bleeding.  Follow-up on history of ulcers..  The various methods of treatment have been discussed with the patient and family. After consideration of risks, benefits and other options for treatment, the patient has consented to  Procedure(s): ESOPHAGOGASTRODUODENOSCOPY (EGD) WITH PROPOFOL (N/A) COLONOSCOPY (N/A) as a surgical intervention.  The patient's history has been reviewed, patient examined, no change in status, stable for surgery.  I have reviewed the patient's chart and labs.  Questions were answered to the patient's satisfaction.     [employer]

## 2021-09-13 NOTE — Anesthesia Postprocedure Evaluation (Signed)
Anesthesia Post Note  Patient: Carl Manalang Sr.  Procedure(s) Performed: ESOPHAGOGASTRODUODENOSCOPY (EGD) WITH PROPOFOL COLONOSCOPY BIOPSY     Patient location during evaluation: PACU Anesthesia Type: MAC Level of consciousness: awake and alert Pain management: pain level controlled Vital Signs Assessment: post-procedure vital signs reviewed and stable Respiratory status: spontaneous breathing, nonlabored ventilation and respiratory function stable Cardiovascular status: blood pressure returned to baseline Postop Assessment: no apparent nausea or vomiting Anesthetic complications: no   No notable events documented.  Last Vitals:  Vitals:   09/13/21 1002 09/13/21 1015  BP: 115/80 123/82  Pulse:  72  Resp: 15 12  Temp: 36.7 C   SpO2: 96% 98%    Last Pain:  Vitals:   09/13/21 1015  TempSrc:   PainSc: 0-No pain                 Marthenia Rolling

## 2021-09-13 NOTE — Progress Notes (Signed)
[facility] NEPHROLOGY PROGRESS NOTE  Assessment/ Plan: Pt is a 61 y.o. yo male with ESRD on HD admitted with bright red blood per rectum, hemoglobin is stable.  OP HD orders: [facility] 3.75hrs 2K 3Ca 400/700 EDW 87.5kg No heparin Mircera 65mg q2wks last 7/26 Calcitriol 1.788m qHD  #Bright red blood per rectum concerning for lower GI bleed: Hemoglobin is stable.  Underwent colonoscopy today when hemorrhoids was found and concern for possible ischemic colitis.  Subsequent underwent CT angio with no evidence of ischemic bowel.  Upper GI endoscopy with nonbleeding gastric and duodenal ulcer therefore recommended PPI.  # ESRD MWF schedule, plan for dialysis today.  UF around 1 L as tolerated by BP.  Okay to discharge after dialysis today.  Communicated with the primary team.  # Anemia: Hemoglobin at goal.  Work-up for GI bleed as above.  Received Mircera on 7/26.  # Secondary hyperparathyroidism: Calcium and phosphorus level acceptable.  Continue Sensipar, Renvela and resume calcitriol.  # HTN/volume: Blood pressure and volume status acceptable.  At dry weight.  Subjective: Seen and examined at bedside.  Saw him before he went to CT scan.  He reports feeling good without any complaint or concern.  No nausea, vomiting, chest pain or shortness of breath.  Objective Vital signs in last 24 hours: Vitals:   09/13/21 0710 09/13/21 0818 09/13/21 1002 09/13/21 1015  BP: 104/66 108/73 115/80 123/82  Pulse: 85 74  72  Resp: '19 14 15 12  ' Temp: 98.3 F (36.8 C) 97.8 F (36.6 C) 98 F (36.7 C)   TempSrc: Oral     SpO2: 100% 97% 96% 98%  Weight:  87.3 kg    Height:  '6\' 2"'  (1.88 m)     Weight change:   Intake/Output Summary (Last 24 hours) at 09/13/2021 1259 Last data filed at 09/13/2021 1005 Gross per 24 hour  Intake 585 ml  Output --  Net 585 ml        Labs: RENAL PANEL Recent Labs    11/10/20 0930 11/14/20 1431 11/15/20 0033 11/16/20 0850 11/18/20 0800  11/21/20 0153 11/23/20 0903 11/24/20 0242 09/11/21 0424 09/12/21 0635 09/13/21 0729  NA 138 137 135 132* 133* 131* 133* 133* 134* 134* 136  K 3.5 3.8 4.8 4.5 4.4 4.7 3.2* 4.1 4.8 3.4* 4.8  CL 98 98 97* 94* 95* 94* 95* 94* 93* 93* 97*  CO2 26  --  '25 22 24 24 28 30 ' 21* 26 23  GLUCOSE 149* 70 103* 88 80 90 123* 95 95 79 84  BUN 28* 29* 34* 61* 43* 52* 24* 26* 63* 30* 40*  CREATININE 7.22* 7.50* 7.64* 9.55* 9.07* 10.00* 4.78* 5.60* 12.92* 7.12* 10.71*  CALCIUM 9.6  --  8.0* 7.7* 8.2* 8.2* 8.4* 8.3* 9.5 9.0 9.0  PHOS  --   --   --  4.6 4.2 4.1 2.2* 3.3  --  3.4 6.1*  ALBUMIN 2.7*  --   --  2.2* 2.1* 1.9* 2.1* 1.9* 3.1* 2.6* 2.7*      Liver Function Tests: Recent Labs  Lab 09/11/21 0424 09/12/21 0635 09/13/21 0729  AST 18  --   --   ALT 14  --   --   ALKPHOS 77  --   --   BILITOT 0.3  --   --   PROT 8.1  --   --   ALBUMIN 3.1* 2.6* 2.7*    No results for input(s): "LIPASE", "AMYLASE" in the last 168 hours. No  results for input(s): "AMMONIA" in the last 168 hours. CBC: Recent Labs    11/16/20 2118 11/17/20 1345 11/25/20 0259 09/11/21 0424 09/11/21 1015 09/12/21 0635 09/13/21 0729  HGB 7.4*   < > 8.8* 11.0* 11.8* 11.3* 10.8*  MCV 87.3   < > 90.2 91.1 93.0 89.4 88.8  FERRITIN 1,472*  --   --   --   --   --   --   TIBC 157*  --   --   --   --   --   --   IRON 23*  --   --   --   --   --   --   RETICCTPCT 1.1  --   --   --   --   --   --    < > = values in this interval not displayed.     Cardiac Enzymes: No results for input(s): "CKTOTAL", "CKMB", "CKMBINDEX", "TROPONINI" in the last 168 hours. CBG: No results for input(s): "GLUCAP" in the last 168 hours.  Iron Studies: No results for input(s): "IRON", "TIBC", "TRANSFERRIN", "FERRITIN" in the last 72 hours. Studies/Results: CT ABDOMEN PELVIS W CONTRAST  Result Date: 09/13/2021 CLINICAL DATA:  Inflammatory bowel disease, ischemic colitis, GI bleeding EXAM: CT ABDOMEN AND PELVIS WITH CONTRAST TECHNIQUE:  Multidetector CT imaging of the abdomen and pelvis was performed using the standard protocol following bolus administration of intravenous contrast. RADIATION DOSE REDUCTION: This exam was performed according to the departmental dose-optimization program which includes automated exposure control, adjustment of the mA and/or kV according to patient size and/or use of iterative reconstruction technique. CONTRAST:  111m OMNIPAQUE IOHEXOL 350 MG/ML SOLN COMPARISON:  07/31/2019 FINDINGS: Lower chest: No acute abnormality.  Coronary artery calcifications. Hepatobiliary: No solid liver abnormality is seen. No gallstones, gallbladder wall thickening, or biliary dilatation. Pancreas: Unremarkable. No pancreatic ductal dilatation or surrounding inflammatory changes. Spleen: Normal in size without significant abnormality. Adrenals/Urinary Tract: Adrenal glands are unremarkable. Status post bilateral nephrectomy. Very limited evaluation of the low pelvis and bladder secondary to dense metallic streak artifact from adjacent hip arthroplasty. Stomach/Bowel: Stomach is within normal limits. Status post partial right hemicolectomy and ileocolic anastomosis. Transverse, descending, and sigmoid diverticulosis. There may be some bowel wall thickening, for example in the mid transverse colon (series 3, image 34) and in the distal sigmoid colon (series 3, image 62). Vascular/Lymphatic: Aortic atherosclerosis. No enlarged abdominal or pelvic lymph nodes. Reproductive: Very limited evaluation of the low pelvis and prostate secondary to dense metallic streak artifact from adjacent hip arthroplasty. Other: No abdominal wall hernia or abnormality. No ascites. Musculoskeletal: Renal osteodystrophy. Status post left hip total arthroplasty. IMPRESSION: 1. Status post partial right hemicolectomy and ileocolic anastomosis. Transverse, descending, and sigmoid diverticulosis. There may be some bowel wall thickening, for example in the mid  transverse colon and in the distal sigmoid colon. Findings are suggestive of diverticulitis or other nonspecific colitis. 2. Status post bilateral nephrectomy. 3. Renal osteodystrophy. 4. Coronary artery disease. Aortic Atherosclerosis (ICD10-I70.0). Electronically Signed   By: ADelanna AhmadiM.D.   On: 09/13/2021 12:38    Medications: Infusions:   Scheduled Medications:  calcitRIOL  1.75 mcg Oral Q M,W,F   cinacalcet  120 mg Oral Q supper   gabapentin  100 mg Oral QHS   multivitamin  1 tablet Oral Daily   pantoprazole  40 mg Oral BID AC   peg 3350 powder  0.5 kit Oral Once   sevelamer carbonate  4,000 mg Oral TID WC  sucralfate  1 g Oral BID    have reviewed scheduled and prn medications.  Physical Exam: General:NAD, comfortable Heart:RRR, s1s2 nl Lungs: Clear b/l, no crackle Abdomen:soft, Non-tender, non-distended Extremities:No edema Dialysis Access: AV fistula  Boluwatife Mutchler Tanna Furry 09/13/2021,12:59 PM  LOS: 0 days

## 2021-09-13 NOTE — Progress Notes (Signed)
Received patient in bed to unit.  Alert and oriented. Informed consent signed and in  chart.   Treatment initiated: [place]  Treatment completed: 1811  Patient tolerated well. Transported back to the room alert, without acute distress.  Hand-off given to patient's nurse.   Access used: RUA Fistula Access issues: None  Total UF removed: 1.2L Medication(s) given: none Post HD VS: 99/73 82 100% 19 98  Post HD weight: 85.9kg   [name]

## 2021-09-13 NOTE — Progress Notes (Signed)
D/C order noted. Contacted [facility] to advise clinic of pt's d/c today and that pt should resume care on Friday.   Lukasz Rogus Renal Navigator [phone] 

## 2021-09-13 NOTE — Discharge Instructions (Signed)
Follow with Primary MD Koirala, Dibas, MD in 7 days   Get CBC, CMP, 2 view Chest X ray -  checked next visit within 1 week by Primary MD    Activity: As tolerated with Full fall precautions use walker/cane & assistance as needed  Disposition Home    Diet: Renal-carb modified diet with 1.2 L fluid restriction per day  Special Instructions: If you have smoked or chewed Tobacco  in the last 2 yrs please stop smoking, stop any regular Alcohol  and or any Recreational drug use.  On your next visit with your primary care physician please Get Medicines reviewed and adjusted.  Please request your Prim.MD to go over all Hospital Tests and Procedure/Radiological results at the follow up, please get all Hospital records sent to your Prim MD by signing hospital release before you go home.  If you experience worsening of your admission symptoms, develop shortness of breath, life threatening emergency, suicidal or homicidal thoughts you must seek medical attention immediately by calling 911 or calling your MD immediately  if symptoms less severe.  You Must read complete instructions/literature along with all the possible adverse reactions/side effects for all the Medicines you take and that have been prescribed to you. Take any new Medicines after you have completely understood and accpet all the possible adverse reactions/side effects.

## 2021-09-13 NOTE — Op Note (Signed)
[facility] Patient Name: [name] Procedure Date : 09/13/2021 MRN: [id number] Attending MD: Justice Britain , MD Date of Birth: 04-Nov-1960 CSN: [id number] Age: 61 Admit Type: Inpatient Procedure:                Colonoscopy Indications:              Hematochezia, Diverticula Providers:                Justice Britain, MD, Allayne Gitelman, RN, [facility]                            Technician, Technician Referring MD:             Lajuan Lines. Pyrtle, MD, [facility] Medicines:                Monitored Anesthesia Care Complications:            No immediate complications. Estimated Blood Loss:     Estimated blood loss was minimal. Procedure:                Pre-Anesthesia Assessment:                           - Prior to the procedure, a History and Physical                            was performed, and patient medications and                            allergies were reviewed. The patient's tolerance of                            previous anesthesia was also reviewed. The risks                            and benefits of the procedure and the sedation                            options and risks were discussed with the patient.                            All questions were answered, and informed consent                            was obtained. Prior Anticoagulants: The patient has                            taken no previous anticoagulant or antiplatelet                            agents except for aspirin. ASA Grade Assessment:                            III - A patient with severe systemic disease. After  reviewing the risks and benefits, the patient was                            deemed in satisfactory condition to undergo the                            procedure.                           After obtaining informed consent, the colonoscope                            was passed under direct vision. Throughout the                             procedure, the patient's blood pressure, pulse, and                            oxygen saturations were monitored continuously. The                            CF-HQ190L ([id number] Olympus coloscope was                            introduced through the anus and advanced to the the                            ileocolonic anastomosis. The colonoscopy was                            somewhat difficult due to restricted mobility of                            the colon and a tortuous colon. Successful                            completion of the procedure was aided by changing                            the patient's position, using manual pressure,                            straightening and shortening the scope to obtain                            bowel loop reduction and using scope torsion. The                            patient tolerated the procedure. The quality of the                            bowel preparation was adequate. The ileocecal  valve, appendiceal orifice, and rectum were                            photographed. Scope In: 9:30:14 AM Scope Out: 9:49:49 AM Scope Withdrawal Time: 0 hours 12 minutes 35 seconds  Total Procedure Duration: 0 hours 19 minutes 35 seconds  Findings:      The digital rectal exam findings include hemorrhoids. Pertinent       negatives include no palpable rectal lesions.      The recto-sigmoid colon and sigmoid colon were significantly tortuous       and fixed making passage as noted above difficult.      There was evidence of a prior functional end-to-end ileo-colonic       anastomosis in the ascending colon. This was patent and was       characterized by healthy appearing mucosa. The anastomosis was traversed.      Many small-mouthed diverticula were found in the entire colon (Left>Rt       burden).      Segmental severe mucosal changes characterized by congestion (edema),       erythema, friability and deep ulcerations were  found in the transverse       colon for more than 20 cm. Biopsies were taken with a cold forceps for       histology.      Normal mucosa was found in the left colon otherwise.      Non-bleeding non-thrombosed internal hemorrhoids were found during       retroflexion, during perianal exam and during digital exam. The       hemorrhoids were Grade II (internal hemorrhoids that prolapse but reduce       spontaneously). Impression:               - Hemorrhoids found on digital rectal exam.                           - Tortuous left colon - likely from diverticular                            burden.                           - Patent functional end-to-end ileo-colonic                            anastomosis, characterized by healthy appearing                            mucosa.                           - Diverticulosis in the entire examined colon                            (Let>Right burden).                           - Segmental severe mucosal changes were found in  the transverse colon secondary to what appears to                            be more of an ischemic colitis. Biopsied.                           - Normal mucosa in the left colon otherwise.                           - Non-bleeding non-thrombosed internal hemorrhoids. Recommendation:           - The patient will be observed post-procedure,                            until all discharge criteria are met.                           - Return patient to hospital ward for ongoing care.                           - Resume previous diet.                           - Patient has no evidence of leukocytosis and is                            feeling improved. Will hold off on empiric                            antibiotics to decrease bacterial translocation, as                            it has already been multiple days since coming into                            hospital.                           - Minimize  NSAIDs.                           - Minimize Hypotension.                           - Recommend a CTAP with IV contrast to evaluate the                            vasculature and ensure no evidence of significant                            stenosis/thrombus that could predispose patient to                            recurrent presentations (time with need for  dialysis).                           - Suspect that this is more of an ischemic colitis                            picture rather than diverticular bleed, though                            neither is impossible. He has significant findings                            on EGD as well, but BRBPR more likely from colonic                            source.                           - The findings and recommendations were discussed                            with the patient.                           - The findings and recommendations were discussed                            with the referring physician. Procedure Code(s):        --- Professional ---                           [phone], Colonoscopy, flexible; with biopsy, single                            or multiple Diagnosis Code(s):        --- Professional ---                           K64.1, Second degree hemorrhoids                           K57.30, Diverticulosis of large intestine without                            perforation or abscess without bleeding                           Z98.0, Intestinal bypass and anastomosis status                           K55.9, Vascular disorder of intestine, unspecified                           K92.1, Melena (includes Hematochezia)                           Q43.8, Other specified congenital malformations  of                            intestine CPT copyright 2019 [employer]. All rights reserved. The codes documented in this report are preliminary and upon coder review may  be revised to meet current  compliance requirements. Justice Britain, MD 09/13/2021 10:19:35 AM Number of Addenda: 0

## 2021-09-13 NOTE — Discharge Summary (Signed)
Carl Wease Sr. RFX:[id number] DOB: 03-26-1960 DOA: 09/11/2021  PCP: [name], MD  Admit date: 09/11/2021  Discharge date: 09/13/2021  Admitted From: Home   Disposition:  Home   Recommendations for Outpatient Follow-up:   Follow up with PCP in 1-2 weeks  PCP Please obtain BMP/CBC, 2 view CXR in 1week,  (see Discharge instructions)   PCP Please follow up on the following pending results:    Home Health: None   Equipment/Devices: None  Consultations: GI, Renal Discharge Condition: Stable    CODE STATUS: Full    Diet Recommendation: Renal-low carbohydrate diet with 1.2 L fluid restriction per day.  Diet Order             Diet renal/carb modified with fluid restriction Diet-HS Snack? Nothing; Room service appropriate? Yes; Fluid consistency: Thin  Diet effective now                    Chief Complaint  Patient presents with   Melena   Headache     Brief history of present illness from the day of admission and additional interim summary    61 y.o. male with medical history significant of GI bleeding with refusal of blood products; ESRD on MWF HD; HTN; and OSA not on CPAP presenting with GI bleeding.  Prior colonoscopy was on 11/25/2020 with diverticulosis and non-bleeding internal hemorrhoids.  EGD was a week prior on 10/7 and showed esophagitis dessicans with pathology c/w acute esophagitis, a 3cm hiatal hernia, a non-bleeding gastric ulcer with adherent clot treated with bipolar cautery with pathology c/w chronic active gastritis, and non-bleeding duodenal diverticulum with mucosal congestion with pathology c/w peptic duodenitis.  He reports that he has been having headache (has known migraines) and took several ASA and Topamax without improvement, denies NSAID use although he acknowledges having  some at home just in case.  He had 2 episodes of dark maroon stools without pain, came to the hospital for evaluation of bright red blood per rectum and was admitted for further evaluation and work-up.                                                                 Hospital Course     Bright red blood per rectum with lower GI bleed.  So far H&H relatively stable, GI on board, question if this is diverticular versus hemorrhoidal bleed.  Continue to monitor H&H, he is Jehov[employer]'s Witness and will not take transfusion even if anemia causes death, he underwent EGD colonoscopy on 09/13/2021, colonoscopy suspicious for ischemic colitis, CT abdomen pelvis with IV contrast ordered.  EGD did show gastric and duodenal ulcers but no signs of active bleeding, per GI is lower GI bleed was most likely due to ischemic colitis.  Monitor closely on PPI and gradually advance  diet.   History of migraines.  Counseled to avoid aspirin and NSAID use, continue as needed Topamax.   ESRD.  On MWF schedule.  Nephrology on board.  Case discussed with nephrology.   Hypertension.  Currently stable off of medications.   Dyslipidemia.  On Lipitor.   SA.  Does not wear CPAP at night.  Monitor.   Recent history of occipital scalp skin rash and nodule -  outpatient dermatology follow-up to be arranged by PCP.      EGD.     Impression:               - No gross lesions in esophagus. Z-line irregular,                            39 cm from the incisors.                           - 2 cm hiatal hernia.                           - Non-bleeding gastric ulcers with a clean ulcer                            base (Forrest Class III) in antrum. Gastritis                            throughout - biopsied.                           - Non-bleeding duodenal ulcers with a clean ulcer                            base (Forrest Class III) in sweep. Duodenitis                            throughout proximal duodenum - biopsied. Recommendation:            - Proceed to scheduled colonoscopy.                           - Observe patient's clinical course.                           - Await pathology results.                           - Transition IV PPI to PO PPI 40 mg twice daily.                           - Recommend Carafate QAC + QHS, though Nephrology                            may not want this amount to be given, so at least                            twice daily (this is what I Rx today).                           -  Repeat upper endoscopy in 4 months to check                            healing.                           - Minimize NSAIDs if at all in future.                           - The findings and recommendations were discussed                            with the patient.                           - The findings and recommendations were discussed                            with the referring physician.   Colonoscopy.   Impression:               - Hemorrhoids found on digital rectal exam.                           - Tortuous left colon - likely from diverticular                            burden.                           - Patent functional end-to-end ileo-colonic                            anastomosis, characterized by healthy appearing                            mucosa.                           - Diverticulosis in the entire examined colon                            (Let>Right burden).                           - Segmental severe mucosal changes were found in                            the transverse colon secondary to what appears to                            be more of an ischemic colitis. Biopsied.                           - Normal mucosa in the left colon otherwise.                           -  Non-bleeding non-thrombosed internal hemorrhoids. Recommendation:           - The patient will be observed post-procedure,                            until all discharge criteria are met.                           -  Return patient to hospital ward for ongoing care.                           - Resume previous diet.                           - Patient has no evidence of leukocytosis and is                            feeling improved. Will hold off on empiric                            antibiotics to decrease bacterial translocation, as                            it has already been multiple days since coming into                            hospital.                           - Minimize NSAIDs.                           - Minimize Hypotension.                           - Recommend a CTAP with IV contrast to evaluate the                            vasculature and ensure no evidence of significant                            stenosis/thrombus that could predispose patient to                            recurrent presentations (time with need for                            dialysis).                           - Suspect that this is more of an ischemic colitis                            picture rather than diverticular bleed, though  neither is impossible. He has significant findings                            on EGD as well, but BRBPR more likely from colonic                            source.                           - The findings and recommendations were discussed                            with the patient.                           - The findings and recommendations were discussed                            with the referring physician.     Discharge diagnosis     Principal Problem:   Acute GI bleeding Active Problems:   HLD (hyperlipidemia)   Refusal of blood transfusions as patient is Jehovah's Witness   Headache disorder   ESRD on hemodialysis ([facility])   Obstructive sleep apnea    Discharge instructions    Discharge Instructions     Discharge instructions   Complete by: As directed    Follow with Primary MD Koirala, Dibas, MD in 7 days   Get CBC, CMP, 2  view Chest X ray -  checked next visit within 1 week by Primary MD    Activity: As tolerated with Full fall precautions use walker/cane & assistance as needed  Disposition Home    Diet: Renal-carb modified diet with 1.2 L fluid restriction per day  Special Instructions: If you have smoked or chewed Tobacco  in the last 2 yrs please stop smoking, stop any regular Alcohol  and or any Recreational drug use.  On your next visit with your primary care physician please Get Medicines reviewed and adjusted.  Please request your Prim.MD to go over all Hospital Tests and Procedure/Radiological results at the follow up, please get all Hospital records sent to your Prim MD by signing hospital release before you go home.  If you experience worsening of your admission symptoms, develop shortness of breath, life threatening emergency, suicidal or homicidal thoughts you must seek medical attention immediately by calling 911 or calling your MD immediately  if symptoms less severe.  You Must read complete instructions/literature along with all the possible adverse reactions/side effects for all the Medicines you take and that have been prescribed to you. Take any new Medicines after you have completely understood and accpet all the possible adverse reactions/side effects.   Increase activity slowly   Complete by: As directed    No wound care   Complete by: As directed        Discharge Medications   Allergies as of 09/13/2021       Reactions   Infed [iron Dextran] Other (See Comments)   Decreased BP    Oxycodone Nausea Only   Vicodin [hydrocodone-acetaminophen] Other (See Comments)   Decreased BP   Diclofenac Other (See Comments)   Unknown        Medication List     TAKE  these medications    acetaminophen 650 MG CR tablet Commonly known as: TYLENOL Take 1,300 mg by mouth every 8 (eight) hours as needed for pain.   amoxicillin 500 MG capsule Commonly known as: AMOXIL Take 4 pills one  hour prior to dental work   atorvastatin 10 MG tablet Commonly known as: LIPITOR Take 10 mg by mouth 2 (two) times a week.   augmented betamethasone dipropionate 0.05 % ointment Commonly known as: DIPROLENE-AF Apply 1 Application topically 2 (two) times daily as needed for rash.   camphor-menthol lotion Commonly known as: SARNA Apply topically 2 (two) times daily. What changed:  how much to take when to take this reasons to take this   cinacalcet 60 MG tablet Commonly known as: SENSIPAR Take 120 mg by mouth daily with supper.   cromolyn 4 % ophthalmic solution Commonly known as: OPTICROM Place 1 drop into both eyes daily as needed (redness).   cyanocobalamin 1000 MCG tablet Commonly known as: VITAMIN B12 Take 1,000 mcg by mouth daily.   docusate sodium 100 MG capsule Commonly known as: Colace Take 1 capsule (100 mg total) by mouth daily as needed. What changed: reasons to take this   gabapentin 100 MG capsule Commonly known as: NEURONTIN Take 100 mg by mouth at bedtime.   ibuprofen 800 MG tablet Commonly known as: ADVIL Take 400 mg by mouth 2 (two) times daily as needed for moderate pain.   Lokelma 10 g Pack packet Generic drug: sodium zirconium cyclosilicate Take 1 packet by mouth daily.   MIRCERA IJ Mircera   multivitamin Tabs tablet Take 1 tablet by mouth daily.   pantoprazole 40 MG tablet Commonly known as: PROTONIX Take 1 tablet (40 mg total) by mouth 2 (two) times daily before a meal.   sevelamer carbonate 800 MG tablet Commonly known as: RENVELA Take 4,000 mg by mouth 3 (three) times daily with meals. 5 tabs with meals   sucralfate 1 GM/10ML suspension Commonly known as: CARAFATE Take 10 mLs (1 g total) by mouth 2 (two) times daily.   vitamin E 180 MG (400 UNITS) capsule Take 400 Units by mouth 3 (three) times a week.         Follow-up Information     Koirala, Dibas, MD. Schedule an appointment as soon as possible for a visit in 1  week(s).   Specialty: Family Medicine Why: Please call the clinic and schedule a hospital follow up with your primary care in the next 7-10 days for an appointment. Contact information: [address] [place] [address] [phone]         Mansouraty, Telford Nab., MD. Schedule an appointment as soon as possible for a visit in 1 week(s).   Specialties: Gastroenterology, Internal Medicine Contact information: [place] [address] [phone]                 Major procedures and Radiology Reports - PLEASE review detailed and final reports thoroughly  -       CT ABDOMEN PELVIS W CONTRAST  Result Date: 09/13/2021 CLINICAL DATA:  Inflammatory bowel disease, ischemic colitis, GI bleeding EXAM: CT ABDOMEN AND PELVIS WITH CONTRAST TECHNIQUE: Multidetector CT imaging of the abdomen and pelvis was performed using the standard protocol following bolus administration of intravenous contrast. RADIATION DOSE REDUCTION: This exam was performed according to the departmental dose-optimization program which includes automated exposure control, adjustment of the mA and/or kV according to patient size and/or use of iterative reconstruction  technique. CONTRAST:  180m OMNIPAQUE IOHEXOL 350 MG/ML SOLN COMPARISON:  07/31/2019 FINDINGS: Lower chest: No acute abnormality.  Coronary artery calcifications. Hepatobiliary: No solid liver abnormality is seen. No gallstones, gallbladder wall thickening, or biliary dilatation. Pancreas: Unremarkable. No pancreatic ductal dilatation or surrounding inflammatory changes. Spleen: Normal in size without significant abnormality. Adrenals/Urinary Tract: Adrenal glands are unremarkable. Status post bilateral nephrectomy. Very limited evaluation of the low pelvis and bladder secondary to dense metallic streak artifact from adjacent hip arthroplasty. Stomach/Bowel: Stomach is within normal limits. Status post partial right hemicolectomy  and ileocolic anastomosis. Transverse, descending, and sigmoid diverticulosis. There may be some bowel wall thickening, for example in the mid transverse colon (series 3, image 34) and in the distal sigmoid colon (series 3, image 62). Vascular/Lymphatic: Aortic atherosclerosis. No enlarged abdominal or pelvic lymph nodes. Reproductive: Very limited evaluation of the low pelvis and prostate secondary to dense metallic streak artifact from adjacent hip arthroplasty. Other: No abdominal wall hernia or abnormality. No ascites. Musculoskeletal: Renal osteodystrophy. Status post left hip total arthroplasty. IMPRESSION: 1. Status post partial right hemicolectomy and ileocolic anastomosis. Transverse, descending, and sigmoid diverticulosis. There may be some bowel wall thickening, for example in the mid transverse colon and in the distal sigmoid colon. Findings are suggestive of diverticulitis or other nonspecific colitis. 2. Status post bilateral nephrectomy. 3. Renal osteodystrophy. 4. Coronary artery disease. Aortic Atherosclerosis (ICD10-I70.0). Electronically Signed   By: ADelanna AhmadiM.D.   On: 09/13/2021 12:38   CT Head Wo Contrast  Result Date: 09/11/2021 CLINICAL DATA:  Headaches. EXAM: CT HEAD WITHOUT CONTRAST TECHNIQUE: Contiguous axial images were obtained from the base of the skull through the vertex without intravenous contrast. RADIATION DOSE REDUCTION: This exam was performed according to the departmental dose-optimization program which includes automated exposure control, adjustment of the mA and/or kV according to patient size and/or use of iterative reconstruction technique. COMPARISON:  03/12/2018 FINDINGS: Brain: No evidence of acute infarction, hemorrhage, hydrocephalus, extra-axial collection or mass lesion/mass effect. Extensive dural calcification in thickening is again identified and appears similar to the previous exam. Vascular: No hyperdense vessel or unexpected calcification. Skull:  Normal. Negative for fracture or focal lesion. Sinuses/Orbits: No acute finding. Other: None IMPRESSION: 1. No acute intracranial abnormalities. No significant change from prior exam. Electronically Signed   By: TKerby MoorsM.D.   On: 09/11/2021 06:29      Today   Subjective    TStepfon Rawlestoday has no headache,no chest abdominal pain,no new weakness tingling or numbness, feels much better wants to go home today.     Objective   Blood pressure 123/82, pulse 72, temperature 98 F (36.7 C), resp. rate 12, height '6\' 2"'  (1.88 m), weight 87.3 kg, SpO2 98 %.   Intake/Output Summary (Last 24 hours) at 09/13/2021 1322 Last data filed at 09/13/2021 1005 Gross per 24 hour  Intake 345 ml  Output --  Net 345 ml    Exam  Awake Alert, No new F.N deficits,    [place].AT,PERRAL Supple Neck,   Symmetrical Chest wall movement, Good air movement bilaterally, CTAB RRR,No Gallops,   +ve B.Sounds, Abd Soft, Non tender,  No Cyanosis, Clubbing or edema    Data Review   Recent Labs  Lab 09/11/21 0424 09/11/21 1015 09/12/21 0635 09/13/21 0729  WBC 7.9 7.3 6.1 5.6  HGB 11.0* 11.8* 11.3* 10.8*  HCT 35.0* 38.6* 35.4* 33.4*  PLT 301 288 277 289  MCV 91.1 93.0 89.4 88.8  MCH 28.6 28.4 28.5 28.7  MCHC 31.4 30.6 31.9 32.3  RDW 16.8* 16.9* 16.6* 16.7*  LYMPHSABS 1.0  --   --   --   MONOABS 0.7  --   --   --   EOSABS 0.3  --   --   --   BASOSABS 0.1  --   --   --     Recent Labs  Lab 09/11/21 0424 09/12/21 0635 09/13/21 0729  NA 134* 134* 136  K 4.8 3.4* 4.8  CL 93* 93* 97*  CO2 21* 26 23  GLUCOSE 95 79 84  BUN 63* 30* 40*  CREATININE 12.92* 7.12* 10.71*  CALCIUM 9.5 9.0 9.0  AST 18  --   --   ALT 14  --   --   ALKPHOS 77  --   --   BILITOT 0.3  --   --   ALBUMIN 3.1* 2.6* 2.7*  PHOS  --  3.4 6.1*  INR 1.1  --   --     Total Time in preparing paper work, data evaluation and todays exam - 35 minutes  [name] M.D on 09/13/2021 at 1:22 PM  [facility]

## 2021-09-13 NOTE — Transfer of Care (Signed)
Immediate Anesthesia Transfer of Care Note  Patient: Carl Hulbert Sr.  Procedure(s) Performed: ESOPHAGOGASTRODUODENOSCOPY (EGD) WITH PROPOFOL COLONOSCOPY BIOPSY  Patient Location: PACU  Anesthesia Type:MAC  Level of Consciousness: awake, alert  and oriented  Airway & Oxygen Therapy: Patient Spontanous Breathing  Post-op Assessment: Report given to RN, Post -op Vital signs reviewed and stable and Patient moving all extremities  Post vital signs: Reviewed and stable  Last Vitals:  Vitals Value Taken Time  BP 115/80 09/13/21 1002  Temp    Pulse    Resp 11 09/13/21 1006  SpO2    Vitals shown include unvalidated device data.  Last Pain:  Vitals:   09/13/21 0818  TempSrc:   PainSc: 0-No pain      Patients Stated Pain Goal: 5 (06/00/45 9977)  Complications: No notable events documented.

## 2021-09-13 NOTE — Anesthesia Procedure Notes (Signed)
Procedure Name: MAC Date/Time: 09/13/2021 9:09 AM  Performed by: Kyung Rudd, CRNAPre-anesthesia Checklist: Patient identified, Emergency Drugs available, Suction available and Patient being monitored Patient Re-evaluated:Patient Re-evaluated prior to induction Oxygen Delivery Method: Nasal cannula Induction Type: IV induction Placement Confirmation: positive ETCO2 Dental Injury: Teeth and Oropharynx as per pre-operative assessment

## 2021-09-14 ENCOUNTER — Telehealth: Payer: Self-pay | Admitting: Nephrology

## 2021-09-14 ENCOUNTER — Telehealth: Payer: Self-pay | Admitting: Gastroenterology

## 2021-09-14 NOTE — Telephone Encounter (Signed)
PT called in about post OP symptoms. He still has had some bleeding and very concerned. Please reach out to advise. Thank you.

## 2021-09-14 NOTE — Telephone Encounter (Signed)
The pt had colon endo done yesterday for Acute post hemorrhagic anemia, Iron deficiency anemia, Melena, Occult blood in stool.  He states that today he has had 3 episodes of loose watery stools with BRB and some clots.  He is concerned and wants to know if this is to be expected.  No pain, or other symptoms.  Please advise

## 2021-09-14 NOTE — Telephone Encounter (Signed)
Transition of Care Contact from [place]   Date of Discharge: 09/13/21 Date of Contact: 09/14/21 Method of contact: phone Talked to patient   Patient contacted to discuss transition of care form recent hospitaliztion. Patient was admitted to [facility] from 09/12/21 to 09/13/21 with the discharge diagnosis of hematochezia, GIB.  Patient reports 2 additional episodes BRBPR this AM.  Denies CO, SOB and dizziness.  Advised to call GI for recommendations. [phone]. If becomes symptomatic advised to return to ED.  Repeat Hgb at outpatient HD ordered for tomorrow.   Medication changes were reviewed.  Patient will follow up with is outpatient dialysis center 09/15/21.  No other needs identified.   Jen Mow, PA-C [employer]

## 2021-09-14 NOTE — Telephone Encounter (Signed)
I called and spoke with the patient this afternoon. He stated that he is bleeding is slowed down. He did have some maroonish stool and clots but again that has slowed down at this point. This would be weird based on any of the interventions that we performed yesterday and that he had no blood in his colon at the time of his colonoscopy. We surmise that his bleeding was either from ischemic colitis +/- diverticular bleeding. The patient is aware that if he has any persisting bleeding overnight he can feel free to reach out to the [facility] on-call physician but otherwise will need to come back into the hospital. He is going to go to dialysis tomorrow and as long as he is feeling well his dialysis team can draw a hemoglobin. If it is down trended significantly he could require repeat admission in case he is having a stuttering diverticular bleed.  Patty, Can you please reach out to the patient tomorrow morning and see how he has done overnight? Thanks. GM

## 2021-09-15 ENCOUNTER — Encounter (HOSPITAL_COMMUNITY): Payer: Self-pay | Admitting: Gastroenterology

## 2021-09-15 DIAGNOSIS — Z992 Dependence on renal dialysis: Secondary | ICD-10-CM | POA: Diagnosis not present

## 2021-09-15 DIAGNOSIS — Z7682 Awaiting organ transplant status: Secondary | ICD-10-CM | POA: Diagnosis not present

## 2021-09-15 DIAGNOSIS — K922 Gastrointestinal hemorrhage, unspecified: Secondary | ICD-10-CM | POA: Diagnosis not present

## 2021-09-15 DIAGNOSIS — D689 Coagulation defect, unspecified: Secondary | ICD-10-CM | POA: Diagnosis not present

## 2021-09-15 DIAGNOSIS — N2581 Secondary hyperparathyroidism of renal origin: Secondary | ICD-10-CM | POA: Diagnosis not present

## 2021-09-15 DIAGNOSIS — D631 Anemia in chronic kidney disease: Secondary | ICD-10-CM | POA: Diagnosis not present

## 2021-09-15 DIAGNOSIS — E875 Hyperkalemia: Secondary | ICD-10-CM | POA: Diagnosis not present

## 2021-09-15 DIAGNOSIS — N186 End stage renal disease: Secondary | ICD-10-CM | POA: Diagnosis not present

## 2021-09-15 LAB — SURGICAL PATHOLOGY

## 2021-09-15 NOTE — Telephone Encounter (Signed)
Thanks for update. GM 

## 2021-09-15 NOTE — Telephone Encounter (Signed)
Left message on machine to call back  

## 2021-09-15 NOTE — Telephone Encounter (Signed)
Patient returned your call, please advise. 

## 2021-09-15 NOTE — Telephone Encounter (Signed)
I spoke with the pt and and he tells me that he is doing well.  No current bleeding.

## 2021-09-17 ENCOUNTER — Inpatient Hospital Stay (HOSPITAL_COMMUNITY)
Admission: EM | Admit: 2021-09-17 | Discharge: 2021-09-29 | DRG: 377 | Disposition: A | Discharge status: Home / Self Care | Payer: Medicare Other | Attending: Internal Medicine | Admitting: Internal Medicine

## 2021-09-17 DIAGNOSIS — E785 Hyperlipidemia, unspecified: Secondary | ICD-10-CM | POA: Diagnosis present

## 2021-09-17 DIAGNOSIS — K5730 Diverticulosis of large intestine without perforation or abscess without bleeding: Secondary | ICD-10-CM

## 2021-09-17 DIAGNOSIS — M898X9 Other specified disorders of bone, unspecified site: Secondary | ICD-10-CM | POA: Diagnosis present

## 2021-09-17 DIAGNOSIS — Z833 Family history of diabetes mellitus: Secondary | ICD-10-CM | POA: Diagnosis not present

## 2021-09-17 DIAGNOSIS — K5731 Diverticulosis of large intestine without perforation or abscess with bleeding: Secondary | ICD-10-CM | POA: Diagnosis not present

## 2021-09-17 DIAGNOSIS — R531 Weakness: Secondary | ICD-10-CM | POA: Diagnosis not present

## 2021-09-17 DIAGNOSIS — M1990 Unspecified osteoarthritis, unspecified site: Secondary | ICD-10-CM | POA: Diagnosis present

## 2021-09-17 DIAGNOSIS — Z832 Family history of diseases of the blood and blood-forming organs and certain disorders involving the immune mechanism: Secondary | ICD-10-CM

## 2021-09-17 DIAGNOSIS — Z992 Dependence on renal dialysis: Secondary | ICD-10-CM | POA: Diagnosis not present

## 2021-09-17 DIAGNOSIS — G4733 Obstructive sleep apnea (adult) (pediatric): Secondary | ICD-10-CM | POA: Diagnosis not present

## 2021-09-17 DIAGNOSIS — Z8719 Personal history of other diseases of the digestive system: Secondary | ICD-10-CM | POA: Diagnosis not present

## 2021-09-17 DIAGNOSIS — K559 Vascular disorder of intestine, unspecified: Secondary | ICD-10-CM | POA: Diagnosis not present

## 2021-09-17 DIAGNOSIS — D638 Anemia in other chronic diseases classified elsewhere: Secondary | ICD-10-CM

## 2021-09-17 DIAGNOSIS — Z905 Acquired absence of kidney: Secondary | ICD-10-CM | POA: Diagnosis not present

## 2021-09-17 DIAGNOSIS — K2980 Duodenitis without bleeding: Secondary | ICD-10-CM

## 2021-09-17 DIAGNOSIS — Z452 Encounter for adjustment and management of vascular access device: Secondary | ICD-10-CM | POA: Diagnosis not present

## 2021-09-17 DIAGNOSIS — E875 Hyperkalemia: Secondary | ICD-10-CM | POA: Diagnosis present

## 2021-09-17 DIAGNOSIS — K922 Gastrointestinal hemorrhage, unspecified: Secondary | ICD-10-CM | POA: Diagnosis not present

## 2021-09-17 DIAGNOSIS — K254 Chronic or unspecified gastric ulcer with hemorrhage: Principal | ICD-10-CM | POA: Diagnosis present

## 2021-09-17 DIAGNOSIS — N186 End stage renal disease: Secondary | ICD-10-CM | POA: Diagnosis present

## 2021-09-17 DIAGNOSIS — I953 Hypotension of hemodialysis: Secondary | ICD-10-CM | POA: Diagnosis not present

## 2021-09-17 DIAGNOSIS — K2971 Gastritis, unspecified, with bleeding: Secondary | ICD-10-CM | POA: Diagnosis not present

## 2021-09-17 DIAGNOSIS — Z85528 Personal history of other malignant neoplasm of kidney: Secondary | ICD-10-CM

## 2021-09-17 DIAGNOSIS — K2990 Gastroduodenitis, unspecified, without bleeding: Secondary | ICD-10-CM | POA: Diagnosis not present

## 2021-09-17 DIAGNOSIS — Z8249 Family history of ischemic heart disease and other diseases of the circulatory system: Secondary | ICD-10-CM

## 2021-09-17 DIAGNOSIS — I700 Atherosclerosis of aorta: Secondary | ICD-10-CM | POA: Diagnosis not present

## 2021-09-17 DIAGNOSIS — Z91199 Patient's noncompliance with other medical treatment and regimen due to unspecified reason: Secondary | ICD-10-CM

## 2021-09-17 DIAGNOSIS — D62 Acute posthemorrhagic anemia: Secondary | ICD-10-CM

## 2021-09-17 DIAGNOSIS — K625 Hemorrhage of anus and rectum: Principal | ICD-10-CM

## 2021-09-17 DIAGNOSIS — Z87891 Personal history of nicotine dependence: Secondary | ICD-10-CM | POA: Diagnosis not present

## 2021-09-17 DIAGNOSIS — K3189 Other diseases of stomach and duodenum: Secondary | ICD-10-CM | POA: Diagnosis not present

## 2021-09-17 DIAGNOSIS — D631 Anemia in chronic kidney disease: Secondary | ICD-10-CM | POA: Diagnosis not present

## 2021-09-17 DIAGNOSIS — R569 Unspecified convulsions: Secondary | ICD-10-CM | POA: Diagnosis present

## 2021-09-17 DIAGNOSIS — K633 Ulcer of intestine: Secondary | ICD-10-CM | POA: Diagnosis not present

## 2021-09-17 DIAGNOSIS — R000 Tachycardia, unspecified: Secondary | ICD-10-CM | POA: Diagnosis not present

## 2021-09-17 DIAGNOSIS — Z823 Family history of stroke: Secondary | ICD-10-CM | POA: Diagnosis not present

## 2021-09-17 DIAGNOSIS — N250 Renal osteodystrophy: Secondary | ICD-10-CM | POA: Diagnosis not present

## 2021-09-17 DIAGNOSIS — I12 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 stage renal disease: Secondary | ICD-10-CM | POA: Diagnosis not present

## 2021-09-17 DIAGNOSIS — I10 Essential (primary) hypertension: Secondary | ICD-10-CM | POA: Diagnosis not present

## 2021-09-17 DIAGNOSIS — K2970 Gastritis, unspecified, without bleeding: Secondary | ICD-10-CM | POA: Diagnosis not present

## 2021-09-17 DIAGNOSIS — Z885 Allergy status to narcotic agent status: Secondary | ICD-10-CM

## 2021-09-17 DIAGNOSIS — Z9049 Acquired absence of other specified parts of digestive tract: Secondary | ICD-10-CM | POA: Diagnosis not present

## 2021-09-17 DIAGNOSIS — Z96642 Presence of left artificial hip joint: Secondary | ICD-10-CM | POA: Diagnosis not present

## 2021-09-17 DIAGNOSIS — K2100 Gastro-esophageal reflux disease with esophagitis, without bleeding: Secondary | ICD-10-CM | POA: Diagnosis present

## 2021-09-17 DIAGNOSIS — Z8673 Personal history of transient ischemic attack (TIA), and cerebral infarction without residual deficits: Secondary | ICD-10-CM | POA: Diagnosis not present

## 2021-09-17 DIAGNOSIS — Z8049 Family history of malignant neoplasm of other genital organs: Secondary | ICD-10-CM

## 2021-09-17 DIAGNOSIS — Z7682 Awaiting organ transplant status: Secondary | ICD-10-CM

## 2021-09-17 DIAGNOSIS — I4510 Unspecified right bundle-branch block: Secondary | ICD-10-CM | POA: Diagnosis present

## 2021-09-17 DIAGNOSIS — Z888 Allergy status to other drugs, medicaments and biological substances status: Secondary | ICD-10-CM | POA: Diagnosis not present

## 2021-09-17 DIAGNOSIS — D649 Anemia, unspecified: Secondary | ICD-10-CM

## 2021-09-17 DIAGNOSIS — Z7189 Other specified counseling: Secondary | ICD-10-CM | POA: Diagnosis not present

## 2021-09-17 DIAGNOSIS — I9589 Other hypotension: Secondary | ICD-10-CM | POA: Diagnosis not present

## 2021-09-17 DIAGNOSIS — K259 Gastric ulcer, unspecified as acute or chronic, without hemorrhage or perforation: Secondary | ICD-10-CM

## 2021-09-17 DIAGNOSIS — K641 Second degree hemorrhoids: Secondary | ICD-10-CM | POA: Diagnosis present

## 2021-09-17 DIAGNOSIS — G4730 Sleep apnea, unspecified: Secondary | ICD-10-CM | POA: Diagnosis not present

## 2021-09-17 DIAGNOSIS — F418 Other specified anxiety disorders: Secondary | ICD-10-CM | POA: Diagnosis not present

## 2021-09-17 DIAGNOSIS — K269 Duodenal ulcer, unspecified as acute or chronic, without hemorrhage or perforation: Secondary | ICD-10-CM | POA: Diagnosis not present

## 2021-09-17 DIAGNOSIS — Z79899 Other long term (current) drug therapy: Secondary | ICD-10-CM

## 2021-09-17 LAB — PROTIME-INR
INR: 1.1 (ref 0.8–1.2)
Prothrombin Time: 14.3 seconds (ref 11.4–15.2)

## 2021-09-17 LAB — COMPREHENSIVE METABOLIC PANEL
ALT: 13 U/L (ref 0–44)
AST: 17 U/L (ref 15–41)
Albumin: 2.8 g/dL — ABNORMAL LOW (ref 3.5–5.0)
Alkaline Phosphatase: 71 U/L (ref 38–126)
Anion gap: 13 (ref 5–15)
BUN: 65 mg/dL — ABNORMAL HIGH (ref 8–23)
CO2: 19 mmol/L — ABNORMAL LOW (ref 22–32)
Calcium: 8.7 mg/dL — ABNORMAL LOW (ref 8.9–10.3)
Chloride: 105 mmol/L (ref 98–111)
Creatinine, Ser: 11.25 mg/dL — ABNORMAL HIGH (ref 0.61–1.24)
GFR, Estimated: 5 mL/min — ABNORMAL LOW (ref >60)
Glucose, Bld: 97 mg/dL (ref 70–99)
Potassium: 6.2 mmol/L — ABNORMAL HIGH (ref 3.5–5.1)
Sodium: 137 mmol/L (ref 135–145)
Total Bilirubin: 0.6 mg/dL (ref 0.3–1.2)
Total Protein: 6.7 g/dL (ref 6.5–8.1)

## 2021-09-17 LAB — POC OCCULT BLOOD, ED: Fecal Occult Bld: POSITIVE — AB

## 2021-09-17 LAB — CBC
HCT: 27.1 % — ABNORMAL LOW (ref 39.0–52.0)
Hemoglobin: 8.2 g/dL — ABNORMAL LOW (ref 13.0–17.0)
MCH: 28.7 pg (ref 26.0–34.0)
MCHC: 30.3 g/dL (ref 30.0–36.0)
MCV: 94.8 fL (ref 80.0–100.0)
Platelets: 275 10*3/uL (ref 150–400)
RBC: 2.86 MIL/uL — ABNORMAL LOW (ref 4.22–5.81)
RDW: 17.2 % — ABNORMAL HIGH (ref 11.5–15.5)
WBC: 9.9 10*3/uL (ref 4.0–10.5)
nRBC: 0 % (ref 0.0–0.2)

## 2021-09-17 LAB — APTT: aPTT: 27 seconds (ref 24–36)

## 2021-09-17 LAB — HEMOGLOBIN AND HEMATOCRIT, BLOOD
HCT: 23.7 % — ABNORMAL LOW (ref 39.0–52.0)
Hemoglobin: 7.2 g/dL — ABNORMAL LOW (ref 13.0–17.0)

## 2021-09-17 MED ORDER — PANTOPRAZOLE SODIUM 40 MG IV SOLR
40.0000 mg | Freq: Once | INTRAVENOUS | Status: AC
  Administered 2021-09-17: 40 mg via INTRAVENOUS
  Filled 2021-09-17: qty 10

## 2021-09-17 MED ORDER — LACTATED RINGERS BOLUS PEDS
500.0000 mL | Freq: Once | INTRAVENOUS | Status: AC
  Administered 2021-09-17: 500 mL via INTRAVENOUS

## 2021-09-17 NOTE — ED Provider Notes (Signed)
[facility] EMERGENCY DEPARTMENT Provider Note   CSN: [id number] Arrival date & time: 09/17/21  1921     History  Chief Complaint  Patient presents with   Rectal Bleeding    Teren Zurcher Sr. is a 61 y.o. male. ESRD on MWF HD; HTN; and OSA not on CPAP, recurrent  GI bleeding Jehovah's witness admitted 7/31-09/13/21 s/p EGD colonoscopy on 09/13/2021, colonoscopy suspicious for ischemic colitis,  EGD did show gastric and duodenal ulcers but no signs of active bleeding, suspected ischemic colitis bleeding now re-presenting with lower GI bleeding.  Patient said he was just here in the hospital for similar that brought him here today.  He says when he left the hospital in August 2 he was no longer having rectal bleeding.  However he started having some mild rectal bleeding yesterday and then had 3 large bloody bowel movements when he felt like he needed to have diarrhea today.  He has had no fevers, abdominal pain, nausea or vomiting.  However he does note feeling lightheaded upon walking but no syncopal events.  He has had generalized weakness and fatigue as well.  He denies any chest pain or shortness of breath.  He is a Jehov[employer] witness and refuses any blood products.  He has been taking his Protonix as prescribed.  He has not taken any NSAIDs or aspirin.  He was told to come back if he had return of any bleeding.  Last hemodialysis session this past Friday.  Rectal Bleeding      Home Medications Prior to Admission medications   Medication Sig Start Date End Date Taking? Authorizing Provider  acetaminophen (TYLENOL) 650 MG CR tablet Take 1,300 mg by mouth every 8 (eight) hours as needed for pain.   Yes [provider]  atorvastatin (LIPITOR) 10 MG tablet Take 10 mg by mouth 2 (two) times a week.   Yes [provider]  augmented betamethasone dipropionate (DIPROLENE-AF) 0.05 % ointment Apply 1 Application topically 2 (two) times daily as needed for  rash. 08/28/21  Yes [provider]  cinacalcet (SENSIPAR) 60 MG tablet Take 120 mg by mouth daily with supper.   Yes [provider]  cromolyn (OPTICROM) 4 % ophthalmic solution Place 1 drop into both eyes daily as needed (redness).   Yes [provider]  docusate sodium (COLACE) 100 MG capsule Take 1 capsule (100 mg total) by mouth daily as needed. 11/08/20 11/08/21 Yes Aundra Dubin, PA-C  gabapentin (NEURONTIN) 100 MG capsule Take 100 mg by mouth at bedtime. 05/15/21  Yes [provider]  ibuprofen (ADVIL) 800 MG tablet Take 400 mg by mouth 2 (two) times daily as needed for moderate pain. 07/02/21  Yes [provider]  LOKELMA 10 g PACK packet Take 1 packet by mouth daily. 05/16/21  Yes [provider]  multivitamin (RENA-VIT) TABS tablet Take 1 tablet by mouth daily.   Yes [provider]  pantoprazole (PROTONIX) 40 MG tablet Take 1 tablet (40 mg total) by mouth 2 (two) times daily before a meal. 09/13/21  Yes Thurnell Lose, MD  sevelamer carbonate (RENVELA) 800 MG tablet Take 4,000 mg by mouth 3 (three) times daily with meals. 5 tabs with meals 06/12/21  Yes [provider]  sucralfate (CARAFATE) 1 GM/10ML suspension Take 10 mLs (1 g total) by mouth 2 (two) times daily. 09/13/21  Yes Thurnell Lose, MD  vitamin B-12 (CYANOCOBALAMIN) 1000 MCG tablet Take 1,000 mcg by mouth daily.  Yes [provider]  vitamin E 180 MG (400 UNITS) capsule Take 400 Units by mouth 3 (three) times a week.   Yes [provider]  amoxicillin (AMOXIL) 500 MG capsule Take 4 pills one hour prior to dental work 05/04/21   Aundra Dubin, PA-C  camphor-menthol [facility]) lotion Apply topically 2 (two) times daily. Patient not taking: Reported on 09/17/2021 11/18/20   Kayleen Memos, DO  Methoxy PEG-Epoetin Beta (MIRCERA IJ) Mircera 05/24/21 05/23/22  [provider]      Allergies    Pietro Cassis dextran], Oxycodone, Vicodin  [hydrocodone-acetaminophen], and Diclofenac    Review of Systems   Review of Systems  Gastrointestinal:  Positive for hematochezia.    Physical Exam Updated Vital Signs BP (!) 90/53   Pulse 81   Temp 98.1 F (36.7 C)   Resp 14   SpO2 100%  Physical Exam Constitutional: Alert and oriented.  Pleasant gentleman, nontoxic. Eyes: Conjunctivae are normal. ENT      Head: Normocephalic and atraumatic.      Nose: No congestion.      Mouth/Throat: Mucous membranes are moist.      Neck: No stridor. Cardiovascular: S1, S2, extremities warm and dry Respiratory: Normal respiratory effort. Breath sounds are normal. Gastrointestinal: Soft and nontender.  Dried blood around the rectum, small red blood on rectal exam, no tenderness on rectal exam, no melena. Musculoskeletal: Normal range of motion in all extremities. Neurologic: Normal speech and language. No gross focal neurologic deficits are appreciated. Skin: Skin is warm, dry and intact. No rash noted. Psychiatric: Mood and affect are normal. Speech and behavior are normal.   ED Results / Procedures / Treatments   Labs (all labs ordered are listed, but only abnormal results are displayed) Labs Reviewed  COMPREHENSIVE METABOLIC PANEL - Abnormal; Notable for the following components:      Result Value   Potassium 6.2 (*)    CO2 19 (*)    BUN 65 (*)    Creatinine, Ser 11.25 (*)    Calcium 8.7 (*)    Albumin 2.8 (*)    GFR, Estimated 5 (*)    All other components within normal limits  CBC - Abnormal; Notable for the following components:   RBC 2.86 (*)    Hemoglobin 8.2 (*)    HCT 27.1 (*)    RDW 17.2 (*)    All other components within normal limits  HEMOGLOBIN AND HEMATOCRIT, BLOOD - Abnormal; Notable for the following components:   Hemoglobin 7.2 (*)    HCT 23.7 (*)    All other components within normal limits  POC OCCULT BLOOD, ED - Abnormal; Notable for the following components:   Fecal Occult Bld POSITIVE (*)    All  other components within normal limits  PROTIME-INR  APTT    EKG EKG Interpretation  Date/Time:  Sunday September 17 2021 20:37:12 EDT Ventricular Rate:  94 PR Interval:  166 QRS Duration: 145 QT Interval:  386 QTC Calculation: 483 R Axis:   -33 Text Interpretation: Sinus rhythm Right bundle branch block Left ventricular hypertrophy Confirmed by Georgina Snell ([id number]) on 09/17/2021 9:09:13 PM  Radiology No results found.  Procedures .Critical Care  Performed by: Elgie Congo, MD Authorized by: Elgie Congo, MD   Critical care provider statement:    Critical care time (minutes):  30   Critical care was necessary to treat or prevent imminent or life-threatening deterioration of the following conditions:  Shock and metabolic crisis  Critical care was time spent personally by me on the following activities:  Development of treatment plan with patient or surrogate, discussions with consultants, evaluation of patient's response to treatment, examination of patient, ordering and review of laboratory studies, ordering and review of radiographic studies, ordering and performing treatments and interventions, pulse oximetry, re-evaluation of patient's condition, review of old charts and obtaining history from patient or surrogate   Care discussed with: admitting provider     Remained on constant cardiac monitoring, sinus tachycardia.  Medications Ordered in ED Medications  pantoprazole (PROTONIX) injection 40 mg (40 mg Intravenous Given 09/17/21 2051)  lactated ringers bolus PEDS (0 mLs Intravenous Stopped 09/17/21 2216)    ED Course/ Medical Decision Making/ A&P Clinical Course as of 09/17/21 2241  Sun Sep 17, 2021  2205 Patient's repeat blood pressure 94/56 improved with initiation of fluids.  Initial hemoglobin 8.2 down from 10.8 from 4 days prior.  Repeat H&H to be obtained now. [VB]  2207 Spoke with gastroenterology team who will evaluate patient tomorrow.  They will  provide formal recommendations tomorrow but agree with continued trending of hemoglobin and supportive care. [VB]  9702 Repeat H&H dropped to 7.2 from 8.2.  Maps remained stable.  Discussed case with hospitalist will be down to evaluate patient and put in orders for admission. [VB]    Clinical Course User Index [VB] Elgie Congo, MD                           Medical Decision Making EKG Hannan Hutmacher Sr. is a 61 y.o. male. ESRD on MWF HD; HTN; and OSA not on CPAP, recurrent  GI bleeding Jehovah's witness admitted 7/31-09/13/21 s/p EGD colonoscopy on 09/13/2021, colonoscopy suspicious for ischemic colitis,  EGD did show gastric and duodenal ulcers but no signs of active bleeding, suspected ischemic colitis bleeding now re-presenting with lower GI bleeding.  Patient with symptomatic anemia and bright red blood per rectum x3 episodes today.  He was recently admitted for similar and found to have ischemic colitis on colonoscopy as well as gastric and duodenal ulcers on EGD on 09/13/2021.  He is likely having repeat bleeding from similar.  He has not taken any NSAIDs or aspirin and does not take any anticoagulation requiring reversal.  Hemoglobin today is 8.2 down from 10.8 from 4 days prior.  No abdominal pain on exam, do not think patient requires CT imaging at this time due to recent work-up.    Additionally, patient hypotensive most recent blood pressures 80s over 50s with tachycardia in the low 100s.  His BMP showed hyperkalemia 6.2 but no changes.  He is difficult in terms of fluid management due to ESRD and being an uric.  Last hemodialysis past Friday.  We will give gentle fluid bolus IV 500 cc LR and consult nephrology to arrange for sooner dialysis treatment or urgent dialysis treatment tomorrow morning as he will likely require fluids for loss 2/2 GIB.  We will plan for repeat H&H, treat patient with IV Protonix and reach out to gastroenterology for any further recommendations and likely admit  hospitalist for monitoring.  Amount and/or Complexity of Data Reviewed External Data Reviewed: notes. Labs: ordered. Decision-making details documented in ED Course. ECG/medicine tests: independent interpretation performed. Decision-making details documented in ED Course.  Risk Prescription drug management. Decision regarding hospitalization.     Final Clinical Impression(s) / ED Diagnoses Final diagnoses:  Bright red blood per rectum  Anemia, unspecified type  Hyperkalemia    Rx / DC Orders ED Discharge Orders     None         Elgie Congo, MD 09/17/21 2241

## 2021-09-17 NOTE — H&P (Incomplete)
History and Physical    Carl Schellenberg Sr. MWU:[id number] DOB: January 09, 1961 DOA: 09/17/2021  PCP: [name], MD  Patient coming from: home  I have personally briefly reviewed patient's old medical records in [place]  Chief Complaint: blood bm at home x 3  HPI: Carl Taussig Sr. is a 61 y.o. male with medical history significant of  GI bleeding with refusal of blood products; CVA,ESRD on MWF HD; HTN;diverticulosis,hemorrhoids, and OSA not on CPAP,depression,GERD,PUD, migraines,who has recent interim history of admission 7/31-8/2 for Gi bleeding. Evaluation at that time was notable for EDG that  showed gastric and duodenal ulcers but no signs of active bleeding, per GI bleeding was thought be from lower GI bleed most likely due to ischemic colitis.Patient was treated with PPI and H/H monitored. 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 Patient now returns with recurrent symptoms x 1 days.  ED Course:   Review of Systems: As per HPI otherwise 10 point review of systems negative.   Past Medical History:  Diagnosis Date  . Acute gastric ulcer with hemorrhage   . Acute GI bleeding 07/31/2019  . Acute pericarditis   . Anemia    hx of  . Anxiety    situational   . Arthritis    on meds  . Borderline diabetes   . Cataract    bilateral sx  . Chronic headaches   . Depression    situational   . Diverticulitis   . ESRD (end stage renal disease) ([place])     On Renal Transplant List," [facility]; MWF" (10/23/2016)  . GERD (gastroesophageal reflux disease)    with certain foods  . GI bleed   . Hypertension    diet controlled  . Parathyroid abnormality (HCC)    ectopic parathyroid gland  . Presence of arteriovenous fistula for hemodialysis, primary (HCC)    RUE PER PT RLE  . Refusal of blood product    NO WHOLE BLOOD PROUCTS  . Renal cell carcinoma ([place])    s/p hand assisted laparoscopic bilateral nephrectomies 11/29/17, + RCC left  . Secondary  hyperparathyroidism ([place])   . Seizures ([place])    one episode in past, due to" elevated Potassium" 08/02/20- "at least 4 years ago"  . Sleep apnea    doesn't use CPAP anymore since weight loss  . Stroke [facility])    no residual    Past Surgical History:  Procedure Laterality Date  . AV FISTULA PLACEMENT Right    right arm  . BIOPSY  08/01/2019   Procedure: BIOPSY;  Surgeon: Juanita Craver, MD;  Location: [facility] ENDOSCOPY;  Service: Endoscopy;;  . BIOPSY  11/18/2020   Procedure: BIOPSY;  Surgeon: Daryel November, MD;  Location: [place];  Service: Gastroenterology;;  . BIOPSY  09/13/2021   Procedure: BIOPSY;  Surgeon: Irving Copas., MD;  Location: [place];  Service: Gastroenterology;;  . CATARACT EXTRACTION W/ INTRAOCULAR LENS  IMPLANT, BILATERAL    . COLON SURGERY    . COLONOSCOPY N/A 08/04/2015   Procedure: COLONOSCOPY;  Surgeon: Jerene Bears, MD;  Location: [facility] ENDOSCOPY;  Service: Endoscopy;  Laterality: N/A;  . COLONOSCOPY  2017   JMP@ [facility]-good prep-mass -recall 1 yr  . COLONOSCOPY N/A 09/13/2021   Procedure: COLONOSCOPY;  Surgeon: Mansouraty, Telford Nab., MD;  Location: [place];  Service: Gastroenterology;  Laterality: N/A;  . COLONOSCOPY WITH PROPOFOL N/A 11/25/2020   Procedure: COLONOSCOPY WITH PROPOFOL;  Surgeon: Sharyn Creamer, MD;  Location:  [place] ENDOSCOPY;  Service: Gastroenterology;  Laterality: N/A;  . ESOPHAGOGASTRODUODENOSCOPY N/A 08/01/2019   Procedure: ESOPHAGOGASTRODUODENOSCOPY (EGD);  Surgeon: Juanita Craver, MD;  Location: [facility] ENDOSCOPY;  Service: Endoscopy;  Laterality: N/A;  . ESOPHAGOGASTRODUODENOSCOPY N/A 11/18/2020   Procedure: ESOPHAGOGASTRODUODENOSCOPY (EGD);  Surgeon: Daryel November, MD;  Location: [place];  Service: Gastroenterology;  Laterality: N/A;  . ESOPHAGOGASTRODUODENOSCOPY (EGD) WITH PROPOFOL N/A 08/04/2019   Procedure: ESOPHAGOGASTRODUODENOSCOPY (EGD) WITH PROPOFOL;  Surgeon: Yetta Flock, MD;  Location: [place];   Service: Gastroenterology;  Laterality: N/A;  . ESOPHAGOGASTRODUODENOSCOPY (EGD) WITH PROPOFOL N/A 09/13/2021   Procedure: ESOPHAGOGASTRODUODENOSCOPY (EGD) WITH PROPOFOL;  Surgeon: Rush Landmark Telford Nab., MD;  Location: [place];  Service: Gastroenterology;  Laterality: N/A;  . graft left arm Left    for dialysis x 2. Removed  . HOT HEMOSTASIS  11/18/2020   Procedure: HOT HEMOSTASIS (ARGON PLASMA COAGULATION/BICAP);  Surgeon: Daryel November, MD;  Location: [facility] ENDOSCOPY;  Service: Gastroenterology;;  . INSERTION OF DIALYSIS CATHETER     Rt chest  . LAPAROSCOPIC RIGHT COLECTOMY N/A 08/05/2015   Procedure: LAPAROSCOPIC RIGHT COLECTOMY- ASCENDING;  Surgeon: Stark Klein, MD;  Location: [place];  Service: General;  Laterality: N/A;  . MASS EXCISION Left 05/28/2019   Procedure: EXCISION SOFT TISSUE MASS LEFT SHOULDER;  Surgeon: Armandina Gemma, MD;  Location: [facility];  Service: General;  Laterality: Left;  . NEPHRECTOMY Bilateral   . PARATHYROIDECTOMY N/A 06/12/2016   Procedure: TOTAL PARATHYROIDECTOMY WITH AUTOTRANSPLANTATION TO LEFT FOREARM;  Surgeon: Armandina Gemma, MD;  Location: [place];  Service: General;  Laterality: N/A;  . PARATHYROIDECTOMY N/A 10/23/2016   Procedure: PARATHYROIDECTOMY;  Surgeon: Armandina Gemma, MD;  Location: [place];  Service: General;  Laterality: N/A;  . REVERSE SHOULDER ARTHROPLASTY Right 08/24/2020   Procedure: REVERSE SHOULDER ARTHROPLASTY;  Surgeon: Hiram Gash, MD;  Location: [place];  Service: Orthopedics;  Laterality: Right;  . REVISON OF ARTERIOVENOUS FISTULA Right 07/16/2017   Procedure: REVISION OF ARTERIOVENOUS FISTULA  Right ARM;  Surgeon: Waynetta Sandy, MD;  Location: [place];  Service: Vascular;  Laterality: Right;  . TOTAL HIP ARTHROPLASTY Left 11/14/2020   Procedure: LEFT TOTAL HIP ARTHROPLASTY ANTERIOR APPROACH;  Surgeon: Leandrew Koyanagi, MD;  Location: [place];  Service: Orthopedics;  Laterality: Left;  . UPPER GASTROINTESTINAL ENDOSCOPY  2021   @ [facility]      reports that he quit smoking about 31 years ago. His smoking use included cigarettes. He has never used smokeless tobacco. He reports current alcohol use of about 1.0 standard drink of alcohol per week. He reports that he does not currently use drugs after having used the following drugs: Marijuana.  Allergies  Allergen Reactions  . Infed [Iron Dextran] Other (See Comments)    Decreased BP   . Oxycodone Nausea Only  . Vicodin [Hydrocodone-Acetaminophen] Other (See Comments)    Decreased BP  . Diclofenac Other (See Comments)    Unknown    Family History  Problem Relation Age of Onset  . Diabetes Father   . Stroke Father   . Hypertension Father   . Uterine cancer Mother   . Lupus Sister   . Stroke Sister   . Hypertension Sister   . Anuerysm Brother        brain  . Colon cancer Neg Hx   . Esophageal cancer Neg Hx   . Stomach cancer Neg Hx   . Pancreatic cancer Neg Hx   . Liver disease Neg Hx   . Colon polyps Neg Hx   .  Rectal cancer Neg Hx    *** Prior to Admission medications   Medication Sig Start Date End Date Taking? Authorizing Provider  acetaminophen (TYLENOL) 650 MG CR tablet Take 1,300 mg by mouth every 8 (eight) hours as needed for pain.   Yes [provider]  atorvastatin (LIPITOR) 10 MG tablet Take 10 mg by mouth 2 (two) times a week.   Yes [provider]  augmented betamethasone dipropionate (DIPROLENE-AF) 0.05 % ointment Apply 1 Application topically 2 (two) times daily as needed for rash. 08/28/21  Yes [provider]  cinacalcet (SENSIPAR) 60 MG tablet Take 120 mg by mouth daily with supper.   Yes [provider]  cromolyn (OPTICROM) 4 % ophthalmic solution Place 1 drop into both eyes daily as needed (redness).   Yes [provider]  docusate sodium (COLACE) 100 MG capsule Take 1 capsule (100 mg total) by mouth daily as needed. 11/08/20 11/08/21 Yes Aundra Dubin, PA-C  gabapentin (NEURONTIN) 100 MG capsule Take  100 mg by mouth at bedtime. 05/15/21  Yes [provider]  ibuprofen (ADVIL) 800 MG tablet Take 400 mg by mouth 2 (two) times daily as needed for moderate pain. 07/02/21  Yes [provider]  LOKELMA 10 g PACK packet Take 1 packet by mouth daily. 05/16/21  Yes [provider]  multivitamin (RENA-VIT) TABS tablet Take 1 tablet by mouth daily.   Yes [provider]  pantoprazole (PROTONIX) 40 MG tablet Take 1 tablet (40 mg total) by mouth 2 (two) times daily before a meal. 09/13/21  Yes Thurnell Lose, MD  sevelamer carbonate (RENVELA) 800 MG tablet Take 4,000 mg by mouth 3 (three) times daily with meals. 5 tabs with meals 06/12/21  Yes [provider]  sucralfate (CARAFATE) 1 GM/10ML suspension Take 10 mLs (1 g total) by mouth 2 (two) times daily. 09/13/21  Yes Thurnell Lose, MD  vitamin B-12 (CYANOCOBALAMIN) 1000 MCG tablet Take 1,000 mcg by mouth daily.   Yes [provider]  vitamin E 180 MG (400 UNITS) capsule Take 400 Units by mouth 3 (three) times a week.   Yes [provider]  amoxicillin (AMOXIL) 500 MG capsule Take 4 pills one hour prior to dental work 05/04/21   Aundra Dubin, PA-C  camphor-menthol [facility]) lotion Apply topically 2 (two) times daily. Patient not taking: Reported on 09/17/2021 11/18/20   Kayleen Memos, DO  Methoxy PEG-Epoetin Beta (MIRCERA IJ) Mircera 05/24/21 05/23/22  [provider]    Physical Exam: Vitals:   09/17/21 2200 09/17/21 2215 09/17/21 2245 09/17/21 2300  BP: (!) 94/56 (!) 90/53 (!) 88/58 (!) 84/66  Pulse: 82 81 86 88  Resp: '17 14 19 16  '$ Temp:      SpO2: 100% 100% 100% 93%    Constitutional: NAD, calm, comfortable Vitals:   09/17/21 2200 09/17/21 2215 09/17/21 2245 09/17/21 2300  BP: (!) 94/56 (!) 90/53 (!) 88/58 (!) 84/66  Pulse: 82 81 86 88  Resp: '17 14 19 16  '$ Temp:      SpO2: 100% 100% 100% 93%   Eyes: PERRL, lids and conjunctivae normal ENMT: Mucous membranes are moist.  Posterior pharynx clear of any exudate or lesions.Normal dentition.  Neck: normal, supple, no masses, no thyromegaly Respiratory: clear to auscultation bilaterally, no wheezing, no crackles. Normal respiratory effort. No accessory muscle use.  Cardiovascular: Regular rate and rhythm, no murmurs / rubs / gallops. No extremity edema. 2+ pedal pulses. No carotid bruits.  Abdomen:  no tenderness, no masses palpated. No hepatosplenomegaly. Bowel sounds positive.  Musculoskeletal: no clubbing / cyanosis. No joint deformity upper and lower extremities. Good ROM, no contractures. Normal muscle tone.  Skin: no rashes, lesions, ulcers. No induration Neurologic: CN 2-12 grossly intact. Sensation intact, DTR normal. Strength 5/5 in all 4.  Psychiatric: Normal judgment and insight. Alert and oriented x 3. Normal mood.    Labs on Admission: I have personally reviewed following labs and imaging studies  CBC: Recent Labs  Lab 09/11/21 0424 09/11/21 1015 09/12/21 0635 09/13/21 0729 09/17/21 1956 09/17/21 2227  WBC 7.9 7.3 6.1 5.6 9.9  --   NEUTROABS 5.8  --   --   --   --   --   HGB 11.0* 11.8* 11.3* 10.8* 8.2* 7.2*  HCT 35.0* 38.6* 35.4* 33.4* 27.1* 23.7*  MCV 91.1 93.0 89.4 88.8 94.8  --   PLT 301 288 277 289 275  --    Basic Metabolic Panel: Recent Labs  Lab 09/11/21 0424 09/12/21 0635 09/13/21 0729 09/17/21 1956  NA 134* 134* 136 137  K 4.8 3.4* 4.8 6.2*  CL 93* 93* 97* 105  CO2 21* 26 23 19*  GLUCOSE 95 79 84 97  BUN 63* 30* 40* 65*  CREATININE 12.92* 7.12* 10.71* 11.25*  CALCIUM 9.5 9.0 9.0 8.7*  PHOS  --  3.4 6.1*  --    GFR: Estimated Creatinine Clearance: 7.9 mL/min (A) (by C-G formula based on SCr of 11.25 mg/dL (H)). Liver Function Tests: Recent Labs  Lab 09/11/21 0424 09/12/21 0635 09/13/21 0729 09/17/21 1956  AST 18  --   --  17  ALT 14  --   --  13  ALKPHOS 77  --   --  71  BILITOT 0.3  --   --  0.6  PROT 8.1  --   --  6.7  ALBUMIN 3.1* 2.6* 2.7* 2.8*   No  results for input(s): "LIPASE", "AMYLASE" in the last 168 hours. No results for input(s): "AMMONIA" in the last 168 hours. Coagulation Profile: Recent Labs  Lab 09/11/21 0424 09/17/21 2227  INR 1.1 1.1   Cardiac Enzymes: No results for input(s): "CKTOTAL", "CKMB", "CKMBINDEX", "TROPONINI" in the last 168 hours. BNP (last 3 results) No results for input(s): "PROBNP" in the last 8760 hours. HbA1C: No results for input(s): "HGBA1C" in the last 72 hours. CBG: No results for input(s): "GLUCAP" in the last 168 hours. Lipid Profile: No results for input(s): "CHOL", "HDL", "LDLCALC", "TRIG", "CHOLHDL", "LDLDIRECT" in the last 72 hours. Thyroid Function Tests: No results for input(s): "TSH", "T4TOTAL", "FREET4", "T3FREE", "THYROIDAB" in the last 72 hours. Anemia Panel: No results for input(s): "VITAMINB12", "FOLATE", "FERRITIN", "TIBC", "IRON", "RETICCTPCT" in the last 72 hours. Urine analysis:    Component Value Date/Time   COLORURINE YELLOW 08/06/2006 0806   APPEARANCEUR CLEAR 08/06/2006 0806   LABSPEC 1.013 08/06/2006 0806   PHURINE 8.0 08/06/2006 0806   GLUCOSEU 100 (A) 08/06/2006 0806   HGBUR MODERATE (A) 08/06/2006 0806   BILIRUBINUR NEGATIVE 08/06/2006 0806   KETONESUR NEGATIVE 08/06/2006 0806   PROTEINUR > 300 08/06/2006 0806   UROBILINOGEN 0.2 08/06/2006 0806   NITRITE NEGATIVE 08/06/2006 0806   LEUKOCYTESUR SMALL 08/06/2006 0806    Radiological Exams on Admission: No results found.  EKG: Independently reviewed. 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   ESRD.  On MWF schedule.  Nephrology on board.  Case discussed with nephrology.   Hypertension.  Currently stable off of medications.    Dyslipidemia.  On Lipitor.   SA.  Does not wear CPAP at night.  Monitor. DVT prophylaxis: *** (Lovenox/Heparin/SCD's/anticoagulated/None (if comfort care) Code Status: *** (Full/Partial (specify details) Family Communication: *** (Specify name, relationship. Do not write "discussed with patient". Specify tel # if discussed over the phone) Disposition Plan: *** (specify when and where you expect patient to be discharged) Consults called: *** (with names) Admission status: *** (inpatient / obs / tele / medical floor / SDU)   Clance Boll MD [facility] Pager [phone]  If 7PM-7AM, please contact night-coverage [email or web address] Password [facility]  09/17/2021, 11:47 PM

## 2021-09-17 NOTE — H&P (Signed)
History and Physical    Carl Scialdone Sr. WLN:[id number] DOB: 13-Jan-1961 DOA: 09/17/2021  PCP: [name], MD  Patient coming from: home  I have personally briefly reviewed patient's old medical records in [place]  Chief Complaint: blood bm at home x 3  HPI: Carl Lieske Sr. is a 61 y.o. male with medical history significant of  GI bleeding with refusal of blood products; CVA,ESRD on MWF HD; HTN;diverticulosis,hemorrhoids, and OSA not on CPAP,depression,GERD,PUD, migraines,who has recent interim history of admission 7/31-8/2 for Gi bleeding. Evaluation at that time was notable for EDG that  showed gastric and duodenal ulcers but no signs of active bleeding, per GI bleeding was thought be from lower GI bleed most likely due to ischemic colitis.Patient was treated with PPI and H/H monitored. 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 Patient now returns with recurrent symptoms x 1 days. He notes no chest pain, abdominal pain , n/v/sob/ or fatigue. He notes no further bleeding since being in ED  ED Course:  Vitals: Afeb, bp100/57-80/57 (96/63), hr 88, rr 18 , sat 100%  Wbc:9.9, hgb 8.2 repeat 7.2 plt 275 Na 137, K 6.2, bicafb 19,  Fob + Tx protonix Review of Systems: As per HPI otherwise 10 point review of systems negative.   Past Medical History:  Diagnosis Date   Acute gastric ulcer with hemorrhage    Acute GI bleeding 07/31/2019   Acute pericarditis    Anemia    hx of   Anxiety    situational    Arthritis    on meds   Borderline diabetes    Cataract    bilateral sx   Chronic headaches    Depression    situational    Diverticulitis    ESRD (end stage renal disease) ([place])     On Renal Transplant List," [facility]; MWF" (10/23/2016)   GERD (gastroesophageal reflux disease)    with certain foods   GI bleed    Hypertension    diet controlled   Parathyroid abnormality (HCC)    ectopic parathyroid gland   Presence of arteriovenous  fistula for hemodialysis, primary ([place])    RUE PER PT RLE   Refusal of blood product    NO WHOLE BLOOD PROUCTS   Renal cell carcinoma ([place])    s/p hand assisted laparoscopic bilateral nephrectomies 11/29/17, + RCC left   Secondary hyperparathyroidism ([place])    Seizures ([place])    one episode in past, due to" elevated Potassium" 08/02/20- "at least 4 years ago"   Sleep apnea    doesn't use CPAP anymore since weight loss   Stroke [facility])    no residual    Past Surgical History:  Procedure Laterality Date   AV FISTULA PLACEMENT Right    right arm   BIOPSY  08/01/2019   Procedure: BIOPSY;  Surgeon: Juanita Craver, MD;  Location: [facility] ENDOSCOPY;  Service: Endoscopy;;   BIOPSY  11/18/2020   Procedure: BIOPSY;  Surgeon: Daryel November, MD;  Location: [place];  Service: Gastroenterology;;   BIOPSY  09/13/2021   Procedure: BIOPSY;  Surgeon: Irving Copas., MD;  Location: [place];  Service: Gastroenterology;;   CATARACT EXTRACTION W/ INTRAOCULAR LENS  IMPLANT, BILATERAL     COLON SURGERY     COLONOSCOPY N/A 08/04/2015   Procedure: COLONOSCOPY;  Surgeon: Jerene Bears, MD;  Location: [facility] ENDOSCOPY;  Service: Endoscopy;  Laterality: N/A;   COLONOSCOPY  2017   JMP@ [facility]-good prep-mass -  recall 1 yr   COLONOSCOPY N/A 09/13/2021   Procedure: COLONOSCOPY;  Surgeon: Mansouraty, Telford Nab., MD;  Location: [place];  Service: Gastroenterology;  Laterality: N/A;   COLONOSCOPY WITH PROPOFOL N/A 11/25/2020   Procedure: COLONOSCOPY WITH PROPOFOL;  Surgeon: Sharyn Creamer, MD;  Location: [place];  Service: Gastroenterology;  Laterality: N/A;   ESOPHAGOGASTRODUODENOSCOPY N/A 08/01/2019   Procedure: ESOPHAGOGASTRODUODENOSCOPY (EGD);  Surgeon: Juanita Craver, MD;  Location[facility] ENDOSCOPY;  Service: Endoscopy;  Laterality: N/A;   ESOPHAGOGASTRODUODENOSCOPY N/A 11/18/2020   Procedure: ESOPHAGOGASTRODUODENOSCOPY (EGD);  Surgeon: Daryel November, MD;  Location: [place];  Service:  Gastroenterology;  Laterality: N/A;   ESOPHAGOGASTRODUODENOSCOPY (EGD) WITH PROPOFOL N/A 08/04/2019   Procedure: ESOPHAGOGASTRODUODENOSCOPY (EGD) WITH PROPOFOL;  Surgeon: Yetta Flock, MD;  Location: [place];  Service: Gastroenterology;  Laterality: N/A;   ESOPHAGOGASTRODUODENOSCOPY (EGD) WITH PROPOFOL N/A 09/13/2021   Procedure: ESOPHAGOGASTRODUODENOSCOPY (EGD) WITH PROPOFOL;  Surgeon: Rush Landmark Telford Nab., MD;  Location: [place];  Service: Gastroenterology;  Laterality: N/A;   graft left arm Left    for dialysis x 2. Removed   HOT HEMOSTASIS  11/18/2020   Procedure: HOT HEMOSTASIS (ARGON PLASMA COAGULATION/BICAP);  Surgeon: Daryel November, MD;  Location: [place];  Service: Gastroenterology;;   INSERTION OF DIALYSIS CATHETER     Rt chest   LAPAROSCOPIC RIGHT COLECTOMY N/A 08/05/2015   Procedure: LAPAROSCOPIC RIGHT COLECTOMY- ASCENDING;  Surgeon: Stark Klein, MD;  Location: [place];  Service: General;  Laterality: N/A;   MASS EXCISION Left 05/28/2019   Procedure: EXCISION SOFT TISSUE MASS LEFT SHOULDER;  Surgeon: Armandina Gemma, MD;  Location: [facility];  Service: General;  Laterality: Left;   NEPHRECTOMY Bilateral    PARATHYROIDECTOMY N/A 06/12/2016   Procedure: TOTAL PARATHYROIDECTOMY WITH AUTOTRANSPLANTATION TO LEFT FOREARM;  Surgeon: Armandina Gemma, MD;  Location: [place];  Service: General;  Laterality: N/A;   PARATHYROIDECTOMY N/A 10/23/2016   Procedure: PARATHYROIDECTOMY;  Surgeon: Armandina Gemma, MD;  Location: [place];  Service: General;  Laterality: N/A;   REVERSE SHOULDER ARTHROPLASTY Right 08/24/2020   Procedure: REVERSE SHOULDER ARTHROPLASTY;  Surgeon: Hiram Gash, MD;  Location: [place];  Service: Orthopedics;  Laterality: Right;   REVISON OF ARTERIOVENOUS FISTULA Right 07/16/2017   Procedure: REVISION OF ARTERIOVENOUS FISTULA  Right ARM;  Surgeon: Waynetta Sandy, MD;  Location: [place];  Service: Vascular;  Laterality: Right;   TOTAL HIP ARTHROPLASTY Left  11/14/2020   Procedure: LEFT TOTAL HIP ARTHROPLASTY ANTERIOR APPROACH;  Surgeon: Leandrew Koyanagi, MD;  Location: [place];  Service: Orthopedics;  Laterality: Left;   UPPER GASTROINTESTINAL ENDOSCOPY  2021   @ [facility]     reports that he quit smoking about 31 years ago. His smoking use included cigarettes. He has never used smokeless tobacco. He reports current alcohol use of about 1.0 standard drink of alcohol per week. He reports that he does not currently use drugs after having used the following drugs: Marijuana.  Allergies  Allergen Reactions   Infed [Iron Dextran] Other (See Comments)    Decreased BP    Oxycodone Nausea Only   Vicodin [Hydrocodone-Acetaminophen] Other (See Comments)    Decreased BP   Diclofenac Other (See Comments)    Unknown    Family History  Problem Relation Age of Onset   Diabetes Father    Stroke Father    Hypertension Father    Uterine cancer Mother    Lupus Sister    Stroke Sister    Hypertension Sister    Anuerysm Brother  brain   Colon cancer Neg Hx    Esophageal cancer Neg Hx    Stomach cancer Neg Hx    Pancreatic cancer Neg Hx    Liver disease Neg Hx    Colon polyps Neg Hx    Rectal cancer Neg Hx     Prior to Admission medications   Medication Sig Start Date End Date Taking? Authorizing Provider  acetaminophen (TYLENOL) 650 MG CR tablet Take 1,300 mg by mouth every 8 (eight) hours as needed for pain.   Yes [provider]  atorvastatin (LIPITOR) 10 MG tablet Take 10 mg by mouth 2 (two) times a week.   Yes [provider]  augmented betamethasone dipropionate (DIPROLENE-AF) 0.05 % ointment Apply 1 Application topically 2 (two) times daily as needed for rash. 08/28/21  Yes [provider]  cinacalcet (SENSIPAR) 60 MG tablet Take 120 mg by mouth daily with supper.   Yes [provider]  cromolyn (OPTICROM) 4 % ophthalmic solution Place 1 drop into both eyes daily as needed (redness).   Yes [provider]  docusate sodium (COLACE) 100 MG capsule Take 1 capsule (100 mg total) by mouth daily as needed. 11/08/20 11/08/21 Yes Aundra Dubin, PA-C  gabapentin (NEURONTIN) 100 MG capsule Take 100 mg by mouth at bedtime. 05/15/21  Yes [provider]  ibuprofen (ADVIL) 800 MG tablet Take 400 mg by mouth 2 (two) times daily as needed for moderate pain. 07/02/21  Yes [provider]  LOKELMA 10 g PACK packet Take 1 packet by mouth daily. 05/16/21  Yes [provider]  multivitamin (RENA-VIT) TABS tablet Take 1 tablet by mouth daily.   Yes [provider]  pantoprazole (PROTONIX) 40 MG tablet Take 1 tablet (40 mg total) by mouth 2 (two) times daily before a meal. 09/13/21  Yes Thurnell Lose, MD  sevelamer carbonate (RENVELA) 800 MG tablet Take 4,000 mg by mouth 3 (three) times daily with meals. 5 tabs with meals 06/12/21  Yes [provider]  sucralfate (CARAFATE) 1 GM/10ML suspension Take 10 mLs (1 g total) by mouth 2 (two) times daily. 09/13/21  Yes Thurnell Lose, MD  vitamin B-12 (CYANOCOBALAMIN) 1000 MCG tablet Take 1,000 mcg by mouth daily.   Yes [provider]  vitamin E 180 MG (400 UNITS) capsule Take 400 Units by mouth 3 (three) times a week.   Yes [provider]  amoxicillin (AMOXIL) 500 MG capsule Take 4 pills one hour prior to dental work 05/04/21   Aundra Dubin, PA-C  camphor-menthol [facility]) lotion Apply topically 2 (two) times daily. Patient not taking: Reported on 09/17/2021 11/18/20   Kayleen Memos, DO  Methoxy PEG-Epoetin Beta (MIRCERA IJ) Mircera 05/24/21 05/23/22  [provider]    Physical Exam: Vitals:   09/17/21 2200 09/17/21 2215 09/17/21 2245 09/17/21 2300  BP: (!) 94/56 (!) 90/53 (!) 88/58 (!) 84/66  Pulse: 82 81 86 88  Resp: '17 14 19 16  '$ Temp:      SpO2: 100% 100% 100% 93%     Vitals:   09/17/21 2200 09/17/21 2215 09/17/21 2245 09/17/21 2300  BP: (!) 94/56 (!) 90/53 (!) 88/58 (!) 84/66   Pulse: 82 81 86 88  Resp: '17 14 19 16  '$ Temp:      SpO2: 100% 100% 100% 93%  Constitutional: NAD, calm, comfortable Eyes: PERRL, lids and conjunctivae normal ENMT: Mucous membranes are moist. Posterior pharynx clear of any exudate or lesions.Normal dentition.  Neck:  normal, supple, no masses, no thyromegaly Respiratory: clear to auscultation bilaterally, no wheezing, no crackles. Normal respiratory effort. No accessory muscle use.  Cardiovascular: Regular rate and rhythm, no murmurs / rubs / gallops. No extremity edema. 2+ pedal pulses. No carotid bruits.  Abdomen: no tenderness, no masses palpated. No hepatosplenomegaly. Bowel sounds positive.  Musculoskeletal: no clubbing / cyanosis. No joint deformity upper and lower extremities. Good ROM, no contractures. Normal muscle tone.  Skin: no rashes, lesions, ulcers. No induration Neurologic: CN 2-12 grossly intact. Sensation intact,   Strength 5/5 in all 4.  Psychiatric: Normal judgment and insight. Alert and oriented x 3. Somewhat irritable   Labs on Admission: I have personally reviewed following labs and imaging studies  CBC: Recent Labs  Lab 09/11/21 0424 09/11/21 1015 09/12/21 0635 09/13/21 0729 09/17/21 1956 09/17/21 2227  WBC 7.9 7.3 6.1 5.6 9.9  --   NEUTROABS 5.8  --   --   --   --   --   HGB 11.0* 11.8* 11.3* 10.8* 8.2* 7.2*  HCT 35.0* 38.6* 35.4* 33.4* 27.1* 23.7*  MCV 91.1 93.0 89.4 88.8 94.8  --   PLT 301 288 277 289 275  --    Basic Metabolic Panel: Recent Labs  Lab 09/11/21 0424 09/12/21 0635 09/13/21 0729 09/17/21 1956  NA 134* 134* 136 137  K 4.8 3.4* 4.8 6.2*  CL 93* 93* 97* 105  CO2 21* 26 23 19*  GLUCOSE 95 79 84 97  BUN 63* 30* 40* 65*  CREATININE 12.92* 7.12* 10.71* 11.25*  CALCIUM 9.5 9.0 9.0 8.7*  PHOS  --  3.4 6.1*  --    GFR: Estimated Creatinine Clearance: 7.9 mL/min (A) (by C-G formula based on SCr of 11.25 mg/dL (H)). Liver Function Tests: Recent Labs  Lab 09/11/21 0424  09/12/21 0635 09/13/21 0729 09/17/21 1956  AST 18  --   --  17  ALT 14  --   --  13  ALKPHOS 77  --   --  71  BILITOT 0.3  --   --  0.6  PROT 8.1  --   --  6.7  ALBUMIN 3.1* 2.6* 2.7* 2.8*   No results for input(s): "LIPASE", "AMYLASE" in the last 168 hours. No results for input(s): "AMMONIA" in the last 168 hours. Coagulation Profile: Recent Labs  Lab 09/11/21 0424 09/17/21 2227  INR 1.1 1.1   Cardiac Enzymes: No results for input(s): "CKTOTAL", "CKMB", "CKMBINDEX", "TROPONINI" in the last 168 hours. BNP (last 3 results) No results for input(s): "PROBNP" in the last 8760 hours. HbA1C: No results for input(s): "HGBA1C" in the last 72 hours. CBG: No results for input(s): "GLUCAP" in the last 168 hours. Lipid Profile: No results for input(s): "CHOL", "HDL", "LDLCALC", "TRIG", "CHOLHDL", "LDLDIRECT" in the last 72 hours. Thyroid Function Tests: No results for input(s): "TSH", "T4TOTAL", "FREET4", "T3FREE", "THYROIDAB" in the last 72 hours. Anemia Panel: No results for input(s): "VITAMINB12", "FOLATE", "FERRITIN", "TIBC", "IRON", "RETICCTPCT" in the last 72 hours. Urine analysis:    Component Value Date/Time   COLORURINE YELLOW 08/06/2006 0806   APPEARANCEUR CLEAR 08/06/2006 0806   LABSPEC 1.013 08/06/2006 0806   PHURINE 8.0 08/06/2006 0806   GLUCOSEU 100 (A) 08/06/2006 0806   HGBUR MODERATE (A) 08/06/2006 0806   BILIRUBINUR NEGATIVE 08/06/2006 0806   KETONESUR NEGATIVE 08/06/2006 0806   PROTEINUR > 300 08/06/2006 0806   UROBILINOGEN 0.2 08/06/2006 0806   NITRITE NEGATIVE 08/06/2006 0806   LEUKOCYTESUR SMALL 08/06/2006 0806    Radiological  Exams on Admission: No results found.  EKG: Independently reviewed. N/a  Assessment/Plan   Lower gi bleed recurrent Acute anemia of blood loss Presumed due to diverticular bleed No further bleeding since admit to ED -gi consulted from ED and will see patient in am Lorenso Courier MD) -monitor h/h  - supportive care for soft bp  with ivfs bolus 250cc prn map ,65 - no blood products due to being [employer] .- daily h/h monitoring or more frequent recurrent symptom,limit blood draws as able -micera due  8/9     Hx of PUD -continue with ppi  ESRD - MWF  -  Nephrology consulted  [place] MD   Hypertension -holding medication due to soft bp   Dyslipidemia.  On Lipitor.   OSA -non compliant with CPAP  -qhs o2 DVT prophylaxis: scd   Code Status:FULL Family Communication: none at bedside Disposition Plan: patient  expected to be admitted greater than 2 midnights  Consults called:  Admission status: progressive care   Clance Boll MD [facility]   If 7PM-7AM, please contact night-coverage [email or web address] Password [facility]  09/17/2021, 11:47 PM

## 2021-09-17 NOTE — ED Triage Notes (Signed)
Pt c/o GI bleeding x2 days; associated weakness, lightheadedness, increasing today. Pt recently admitted, discharged last Thursday for same.

## 2021-09-18 ENCOUNTER — Encounter: Payer: Self-pay | Admitting: Gastroenterology

## 2021-09-18 ENCOUNTER — Encounter (HOSPITAL_COMMUNITY): Payer: Self-pay | Admitting: Internal Medicine

## 2021-09-18 DIAGNOSIS — E875 Hyperkalemia: Secondary | ICD-10-CM

## 2021-09-18 DIAGNOSIS — N186 End stage renal disease: Secondary | ICD-10-CM

## 2021-09-18 DIAGNOSIS — I10 Essential (primary) hypertension: Secondary | ICD-10-CM | POA: Diagnosis not present

## 2021-09-18 DIAGNOSIS — Z992 Dependence on renal dialysis: Secondary | ICD-10-CM

## 2021-09-18 DIAGNOSIS — E785 Hyperlipidemia, unspecified: Secondary | ICD-10-CM

## 2021-09-18 DIAGNOSIS — K922 Gastrointestinal hemorrhage, unspecified: Secondary | ICD-10-CM

## 2021-09-18 LAB — COMPREHENSIVE METABOLIC PANEL
ALT: 11 U/L (ref 0–44)
AST: 17 U/L (ref 15–41)
Albumin: 2.6 g/dL — ABNORMAL LOW (ref 3.5–5.0)
Alkaline Phosphatase: 54 U/L (ref 38–126)
Anion gap: 10 (ref 5–15)
BUN: 75 mg/dL — ABNORMAL HIGH (ref 8–23)
CO2: 20 mmol/L — ABNORMAL LOW (ref 22–32)
Calcium: 8.6 mg/dL — ABNORMAL LOW (ref 8.9–10.3)
Chloride: 109 mmol/L (ref 98–111)
Creatinine, Ser: 11.9 mg/dL — ABNORMAL HIGH (ref 0.61–1.24)
GFR, Estimated: 4 mL/min — ABNORMAL LOW (ref >60)
Glucose, Bld: 78 mg/dL (ref 70–99)
Potassium: 6.4 mmol/L (ref 3.5–5.1)
Sodium: 139 mmol/L (ref 135–145)
Total Bilirubin: 0.6 mg/dL (ref 0.3–1.2)
Total Protein: 6.2 g/dL — ABNORMAL LOW (ref 6.5–8.1)

## 2021-09-18 LAB — CBC
HCT: 24.4 % — ABNORMAL LOW (ref 39.0–52.0)
Hemoglobin: 7.4 g/dL — ABNORMAL LOW (ref 13.0–17.0)
MCH: 28.6 pg (ref 26.0–34.0)
MCHC: 30.3 g/dL (ref 30.0–36.0)
MCV: 94.2 fL (ref 80.0–100.0)
Platelets: 227 10*3/uL (ref 150–400)
RBC: 2.59 MIL/uL — ABNORMAL LOW (ref 4.22–5.81)
RDW: 17.3 % — ABNORMAL HIGH (ref 11.5–15.5)
WBC: 12.1 10*3/uL — ABNORMAL HIGH (ref 4.0–10.5)
nRBC: 0 % (ref 0.0–0.2)

## 2021-09-18 MED ORDER — ACETAMINOPHEN 325 MG PO TABS
650.0000 mg | ORAL_TABLET | Freq: Four times a day (QID) | ORAL | Status: DC | PRN
  Administered 2021-09-18 – 2021-09-29 (×10): 650 mg via ORAL
  Filled 2021-09-18 (×10): qty 2

## 2021-09-18 MED ORDER — CALCITRIOL 0.25 MCG PO CAPS
1.7500 ug | ORAL_CAPSULE | ORAL | Status: DC
Start: 2021-09-18 — End: 2021-09-29
  Administered 2021-09-18 – 2021-09-29 (×6): 1.75 ug via ORAL
  Filled 2021-09-18 (×4): qty 7
  Filled 2021-09-18: qty 1
  Filled 2021-09-18: qty 7

## 2021-09-18 MED ORDER — ATORVASTATIN CALCIUM 10 MG PO TABS
10.0000 mg | ORAL_TABLET | ORAL | Status: DC
  Administered 2021-09-18 – 2021-09-28 (×4): 10 mg via ORAL
  Filled 2021-09-18 (×6): qty 1

## 2021-09-18 MED ORDER — TRAMADOL HCL 50 MG PO TABS
25.0000 mg | ORAL_TABLET | Freq: Two times a day (BID) | ORAL | Status: DC | PRN
  Administered 2021-09-18 – 2021-09-28 (×5): 25 mg via ORAL
  Filled 2021-09-18 (×5): qty 1

## 2021-09-18 MED ORDER — ACETAMINOPHEN 325 MG PO TABS
650.0000 mg | ORAL_TABLET | Freq: Four times a day (QID) | ORAL | Status: DC | PRN
Start: 2021-09-18 — End: 2021-09-18

## 2021-09-18 MED ORDER — PANTOPRAZOLE SODIUM 40 MG IV SOLR
40.0000 mg | Freq: Two times a day (BID) | INTRAVENOUS | Status: DC
  Administered 2021-09-18 (×2): 40 mg via INTRAVENOUS
  Filled 2021-09-18 (×2): qty 10

## 2021-09-18 MED ORDER — ONDANSETRON HCL 4 MG PO TABS: 4.0000 mg | ORAL_TABLET | Freq: Four times a day (QID) | ORAL | Status: DC | PRN

## 2021-09-18 MED ORDER — SODIUM CHLORIDE 0.9 % IV BOLUS
250.0000 mL | Freq: Three times a day (TID) | INTRAVENOUS | Status: DC | PRN
  Administered 2021-09-18: 250 mL via INTRAVENOUS

## 2021-09-18 MED ORDER — DARBEPOETIN ALFA 100 MCG/0.5ML IJ SOSY
100.0000 ug | PREFILLED_SYRINGE | INTRAMUSCULAR | Status: DC
  Administered 2021-09-18: 100 ug via INTRAVENOUS
  Filled 2021-09-18: qty 0.5

## 2021-09-18 MED ORDER — ALBUTEROL SULFATE (2.5 MG/3ML) 0.083% IN NEBU: 2.5000 mg | INHALATION_SOLUTION | RESPIRATORY_TRACT | Status: DC | PRN

## 2021-09-18 MED ORDER — ONDANSETRON HCL 4 MG/2ML IJ SOLN
4.0000 mg | Freq: Four times a day (QID) | INTRAMUSCULAR | Status: DC | PRN
  Administered 2021-09-19: 4 mg via INTRAVENOUS
  Filled 2021-09-18: qty 2

## 2021-09-18 MED ORDER — CHLORHEXIDINE GLUCONATE CLOTH 2 % EX PADS
6.0000 | MEDICATED_PAD | Freq: Every day | CUTANEOUS | Status: DC
  Administered 2021-09-19 – 2021-09-29 (×10): 6 via TOPICAL

## 2021-09-18 NOTE — ED Notes (Signed)
Nephrology (Dr. Marval Regal) paged regarding most recent potassium value

## 2021-09-18 NOTE — ED Notes (Signed)
Attempted to draw labs per MD order. Pt refused, stated "...I know where my blood can and cannot be drawn. If you're going to draw it there you can go out the door." Asked to pt if he would like me to draw his labs, pt said "No thank you, you can leave out the door." RN advised. [employer]

## 2021-09-18 NOTE — ED Notes (Signed)
Dr. Lonny Prude at bedside

## 2021-09-18 NOTE — Progress Notes (Signed)
Received patient in bed to unit. Alert and oriented. Informed consent signed and in chart.   Treatment initiated: [place]  Treatment completed: 1830   Patient tolerated well. Transported back to the room alert, without acute distress. Report given to patient's floor nurse.   Access used: RUA Fistula Access issues: n/a  Total UF removed: 1700  Medication(s) given: Calcitriol 1.57mh, Aranesp 1060m  Post HD VS: 111/74 100% 110 10 98.2  Post HD weight: 87.2 kg   [name]

## 2021-09-18 NOTE — Progress Notes (Signed)
   PROGRESS NOTE    Carl Orourke Sr.  ZSW:[id number] DOB: 1960-11-02 DOA: 09/17/2021 PCP: [name], MD   Brief Narrative: [name] is a 61 y.o. male with a history of ESRD on HD, CVA, hypertension, diverticulosis, hemorrhoids, ischemic colitis, OSA not on CPAP, GERD, PUD, migraines. Patient presented secondary to recurrent bloody stools.   Assessment and Plan:  Recurrent GI bleed Recently diagnosed with ischemic colitis in addition to history of PUD and diverticulosis. Recent EGD and colonoscopy performed. GI consulted with recommendations for supportive care. -Continue supportive care -Continue Protonix -CBC in AM  Acute on chronic anemia Acute blood loss anemia Acute anemia  ESRD on HD Nephrology consulted for hemodialysis while inpatient.  Hyperkalemia Potassium of 6.4. Nephrology will manage with hemodialysis  Primary hypertension Patient is not on antihypertensives as an outpatient at this time.  Hyperlipidemia Patient is on Lipitor as an outpatient.  OSA on CPAP Patient is nonadherent as an outpatient  DVT prophylaxis: SCDs Code Status:   Code Status: Full Code Family Communication: None at bedside Disposition Plan: Discharge home pending GI workup/management   Consultants:  [facility]  Procedures:  Hemodialysis  Antimicrobials: None    Subjective: Patient reports a bloody bowel movement earlier this morning  Objective: BP (!) 131/91   Pulse 64   Temp 97.9 F (36.6 C) (Oral)   Resp 13   Wt 89.6 kg   SpO2 100%   BMI 25.36 kg/m   Examination:  General exam: Appears calm and comfortable Respiratory system: Clear to auscultation. Respiratory effort normal. Cardiovascular system: S1 & S2 heard, RRR. Gastrointestinal system: Abdomen is nondistended, soft and nontender. Normal bowel sounds heard. Central nervous system: Alert and oriented. No focal neurological deficits. Psychiatry: Judgement and insight appear  normal. Mood & affect appropriate.    Data Reviewed: I have personally reviewed following labs and imaging studies  CBC Lab Results  Component Value Date   WBC 12.1 (H) 09/18/2021   RBC 2.59 (L) 09/18/2021   HGB 7.4 (L) 09/18/2021   HCT 24.4 (L) 09/18/2021   MCV 94.2 09/18/2021   MCH 28.6 09/18/2021   PLT 227 09/18/2021   MCHC 30.3 09/18/2021   RDW 17.3 (H) 09/18/2021   LYMPHSABS 1.0 09/11/2021   MONOABS 0.7 09/11/2021   EOSABS 0.3 09/11/2021   BASOSABS 0.1 32/20/2542     Last metabolic panel Lab Results  Component Value Date   NA 139 09/18/2021   K 6.4 (HH) 09/18/2021   CL 109 09/18/2021   CO2 20 (L) 09/18/2021   BUN 75 (H) 09/18/2021   CREATININE 11.90 (H) 09/18/2021   GLUCOSE 78 09/18/2021   GFRNONAA 4 (L) 09/18/2021   GFRAA 5 (L) 08/05/2019   CALCIUM 8.6 (L) 09/18/2021   PHOS 6.1 (H) 09/13/2021   PROT 6.2 (L) 09/18/2021   ALBUMIN 2.6 (L) 09/18/2021   BILITOT 0.6 09/18/2021   ALKPHOS 54 09/18/2021   AST 17 09/18/2021   ALT 11 09/18/2021   ANIONGAP 10 09/18/2021    GFR: Estimated Creatinine Clearance: 7.5 mL/min (A) (by C-G formula based on SCr of 11.9 mg/dL (H)).  No results found for this or any previous visit (from the past 240 hour(s)).    Radiology Studies: No results found.    LOS: 1 day    Cordelia Poche, MD [facility] 09/18/2021, 1:43 PM   If 7PM-7AM, please contact night-coverage [email or web address]

## 2021-09-18 NOTE — Procedures (Signed)
   I was present at this dialysis session, have reviewed the session itself and made  appropriate changes Kelly Splinter MD [place] pager [phone]   09/18/2021, 3:58 PM

## 2021-09-18 NOTE — ED Notes (Signed)
Pt weighed. 89.6kg

## 2021-09-18 NOTE — ED Notes (Signed)
Report given to [place], HD RN

## 2021-09-18 NOTE — Progress Notes (Signed)
Pt receives out-pt HD at [facility] on MWF. Pt arrives at 5:20 for 5:40 chair time. Will assist as needed.   Melven Sartorius Renal Navigator [phone]

## 2021-09-18 NOTE — ED Notes (Signed)
Page sent to Marcello Moores, MD due to pt pressures dropping to 80s/40s. No response as of this time

## 2021-09-18 NOTE — ED Notes (Signed)
Dr.Schertz at bedside  

## 2021-09-18 NOTE — ED Notes (Signed)
Pt ambulatory to and from restroom with steady gait 

## 2021-09-18 NOTE — ED Notes (Signed)
Pt transported to dialysis

## 2021-09-18 NOTE — ED Notes (Signed)
Dr. Teryl Lucy notified on potassium of 6.4

## 2021-09-18 NOTE — Consult Note (Signed)
[facility] Consult: 9:22 AM 09/18/2021  LOS: 1 day    Referring Provider: Dr Georgena Spurling  Primary Care Physician:  [name], MD Primary Gastroenterologist:  Dr. Candis Schatz     Reason for Consultation:  recurrent GI bleed.     HPI: Carl Deeg Sr. is a 61 y.o. male.  PMH ESRD, MWF HD.  Bil nephrectomy for renal cell carcinoma.  OSA, not on CPAP.  Multifactorial anemia including acute blood loss in past and anemia of chronic kidney disease, Hgb as low as 5.4 in 11/2020.  Prior treatments with parenteral iron and ESA therapy.  [employer], generally declines blood product transfusions but accepted 2 PRBCs in 07/2015, 3 PRBCs in 11/2020.   Hx GI bleeding.  2017 colonoscopy for hematochezia, abnormal CT.  Revealed ulcerated, partially obstructing large mass in proximal ascending colon near ICV.  Numerous diverticula in ascending and sigmoid.  Nonbleeding hemorrhoids.  Pathology confirmed colonic mucosa and ulcerative granulation tissue with reactive changes.  No malignancy. 2017 partial colectomy, small bowel resection to address noncancerous cecal mass. 08/01/2019 EGD.  For melena, blood loss anemia.  Revealed 6 mm sessile gastric cardia polyp, not removed.  7 to 8 mm prepyloric ulcer.  Patchy gastritis.  Pathology: Reactive gastropathy, multiple foci of intestinal metaplasia, no dysplasia, no H. pylori 08/03/2015 EGD #2  For follow-up and declining Hgb.  Gastric polyp.  Gastric erythema.  Nonbleeding clean-based gastric ulcer at pyloric channel sized 4 to 5 mm.  No heme or blood seen.  Patchy duodenal bulb mucosa.  This prophylaxing in the second portion of duodenum in and out of the bulb.  No hematin, bleeding or stigmata of bleeding seen. 11/2020 colonoscopy.   Poor prep.  Patent, healthy appearing  ileocolonic anastomosis.  Tics in the sigmoid, descending and transverse colon.  Nonbleeding internal hemorrhoids. 11/2020 EGD. 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  Hiatal hernia.  Enlarged gastric folds.  Nonbleeding gastric ulcer with adherent clot, treated with bipolar cautery.  Based on location MD suspected mechanical etiology.  Nonbleeding duodenal diverticulum.  Congested duodenal mucosa. Path: Peptic duodenitis.  Chronic active gastritis.  Acute esophagitis.  No h pylori.   07/2019 CTAP w contrast: Diffuse colon diverticulosis, subtle inflammation at rectosigmoid, likely mild acute diverticulitis.  Distended GB but no stones or GB inflammation.  Was not taking PPI or H2 blocker at home when admitted 7/31 - 8/2 for painless hematochezia limited to 2 episodes on 7/30 PM, blood loss anemia, hypotension.   09/13/2021 colonoscopy.  Nonbleeding internal hemorrhoids.  Tortuous left colon likely from diverticular burden.  Patent, healthy-appearing ileocolonic anastomosis.  Extensive Pan-diverticulosis, more pronounced on the left colon.  Segmental colitis in the transverse colon, appeared ischemic.  Pathology: Ulcerated colonic mucosa, differential includes ischemia.   09/13/2021 EGD. Irregular Z-line.  2 cm HH.  Clean-based, nonbleeding antral ulcer.  Pan gastritis, biopsied (mildly active chronic gastritis, no H. Pylori).  Clean-based, nonbleeding duodenal ulcers with duodenitis in proximal duodenum, biopsied (peptic duodenitis). 09/13/2021 CTAP w contrast: changes of partial right hemicolectomy, ileocolic anastomosis.  Transverse,  descending, sigmoid diverticulosis.  Possible colon thickening in the mid transverse and distal sigmoid which was nonspecific but suggestive of colitis or diverticulitis.  After discharge patient had further episodes of maroon bleeding per rectum.  Phoned GI 8/3, he was stable.  Dr. Rush Landmark suspected bleeding from either ischemic colitis and or  diverticular bleed. On 8/4 bleeding had resolved.     Returned to ED on 8/6, yesterday.  Recurrent hematochezia starting Saturday p.m., 1 episode then, 3 episodes Sunday morning.  Fourth episode was after arrival in the ED yesterday afternoon/evening.  Felt weakness and fatigue but no dizziness, no syncope, no chest pain, no dyspnea, no nausea/vomiting..  Had been taking newly prescribed Protonix and avoiding all NSAIDs, ASA.  Last dialysis session was on Friday, 8/4. Hgb 8.2..  7.4  @ 9AM this morning.  Was 10.8 on 8/2, 5 days ago.  Platelets, WBCs, INR normal. Potassium elevated 6.4.  A bowel movement at 9 AM this morning was dark, consistent with old blood.  He again denies any other associated GI symptoms.  Patient relays having consistent low blood pressures recorded during dialysis sessions.  During the course of his stay in the ED, he has had pressures as low as 80s/40s.  Only 1 episode of tachycardia otherwise heart rates in the 70s to low 90s.  Excellent room air sats.  No fevers.  Family history negative for gastrointestinal cancers.  Mother had uterine cancer. Previous smoker.  Single.  Lives in [place].  [name] is his significant other, cell phone [phone]  Past Medical History:  Diagnosis Date   Acute gastric ulcer with hemorrhage    Acute GI bleeding 07/31/2019   Acute pericarditis    Anemia    hx of   Anxiety    situational    Arthritis    on meds   Borderline diabetes    Cataract    bilateral sx   Chronic headaches    Depression    situational    Diverticulitis    ESRD (end stage renal disease) ([place])     On Renal Transplant List," [facility]; MWF" (10/23/2016)   GERD (gastroesophageal reflux disease)    with certain foods   GI bleed    Hypertension    diet controlled   Parathyroid abnormality (HCC)    ectopic parathyroid gland   Presence of arteriovenous fistula for hemodialysis, primary ([place])    RUE PER PT RLE   Refusal of blood product    NO WHOLE BLOOD  PROUCTS   Renal cell carcinoma ([place])    s/p hand assisted laparoscopic bilateral nephrectomies 11/29/17, + RCC left   Secondary hyperparathyroidism ([place])    Seizures ([place])    one episode in past, due to" elevated Potassium" 08/02/20- "at least 4 years ago"   Sleep apnea    doesn't use CPAP anymore since weight loss   Stroke [facility])    no residual    Past Surgical History:  Procedure Laterality Date   AV FISTULA PLACEMENT Right    right arm   BIOPSY  08/01/2019   Procedure: BIOPSY;  Surgeon: Juanita Craver, MD;  Location: [facility] ENDOSCOPY;  Service: Endoscopy;;   BIOPSY  11/18/2020   Procedure: BIOPSY;  Surgeon: Daryel November, MD;  Location: [place];  Service: Gastroenterology;;   BIOPSY  09/13/2021   Procedure: BIOPSY;  Surgeon: Irving Copas., MD;  Location: [facility] END[facility]COPY;  Service: Gastroenterology;;   CATARACT EXTRACTION W/ INTRAOCULAR LENS  IMPLANT, BILATERAL     COLON  SURGERY     COLONOSCOPY N/A 08/04/2015   Procedure: COLONOSCOPY;  Surgeon: Jerene Bears, MD;  Location: [facility] ENDOSCOPY;  Service: Endoscopy;  Laterality: N/A;   COLONOSCOPY  2017   JMP@ [facility]-good prep-mass -recall 1 yr   COLONOSCOPY N/A 09/13/2021   Procedure: COLONOSCOPY;  Surgeon: Mansouraty, Telford Nab., MD;  Location: [place];  Service: Gastroenterology;  Laterality: N/A;   COLONOSCOPY WITH PROPOFOL N/A 11/25/2020   Procedure: COLONOSCOPY WITH PROPOFOL;  Surgeon: Sharyn Creamer, MD;  Location: [place];  Service: Gastroenterology;  Laterality: N/A;   ESOPHAGOGASTRODUODENOSCOPY N/A 08/01/2019   Procedure: ESOPHAGOGASTRODUODENOSCOPY (EGD);  Surgeon: Juanita Craver, MD;  Location: [facility] ENDOSCOPY;  Service: Endoscopy;  Laterality: N/A;   ESOPHAGOGASTRODUODENOSCOPY N/A 11/18/2020   Procedure: ESOPHAGOGASTRODUODENOSCOPY (EGD);  Surgeon: Daryel November, MD;  Location: [place];  Service: Gastroenterology;  Laterality: N/A;   ESOPHAGOGASTRODUODENOSCOPY (EGD) WITH PROPOFOL N/A 08/04/2019   Procedure:  ESOPHAGOGASTRODUODENOSCOPY (EGD) WITH PROPOFOL;  Surgeon: Yetta Flock, MD;  Location: [place];  Service: Gastroenterology;  Laterality: N/A;   ESOPHAGOGASTRODUODENOSCOPY (EGD) WITH PROPOFOL N/A 09/13/2021   Procedure: ESOPHAGOGASTRODUODENOSCOPY (EGD) WITH PROPOFOL;  Surgeon: Rush Landmark Telford Nab., MD;  Location: [place];  Service: Gastroenterology;  Laterality: N/A;   graft left arm Left    for dialysis x 2. Removed   HOT HEMOSTASIS  11/18/2020   Procedure: HOT HEMOSTASIS (ARGON PLASMA COAGULATION/BICAP);  Surgeon: Daryel November, MD;  Location: [place];  Service: Gastroenterology;;   INSERTION OF DIALYSIS CATHETER     Rt chest   LAPAROSCOPIC RIGHT COLECTOMY N/A 08/05/2015   Procedure: LAPAROSCOPIC RIGHT COLECTOMY- ASCENDING;  Surgeon: Stark Klein, MD;  Location: [place];  Service: General;  Laterality: N/A;   MASS EXCISION Left 05/28/2019   Procedure: EXCISION SOFT TISSUE MASS LEFT SHOULDER;  Surgeon: Armandina Gemma, MD;  Location: [facility];  Service: General;  Laterality: Left;   NEPHRECTOMY Bilateral    PARATHYROIDECTOMY N/A 06/12/2016   Procedure: TOTAL PARATHYROIDECTOMY WITH AUTOTRANSPLANTATION TO LEFT FOREARM;  Surgeon: Armandina Gemma, MD;  Location: [place];  Service: General;  Laterality: N/A;   PARATHYROIDECTOMY N/A 10/23/2016   Procedure: PARATHYROIDECTOMY;  Surgeon: Armandina Gemma, MD;  Location: [place];  Service: General;  Laterality: N/A;   REVERSE SHOULDER ARTHROPLASTY Right 08/24/2020   Procedure: REVERSE SHOULDER ARTHROPLASTY;  Surgeon: Hiram Gash, MD;  Location: [place];  Service: Orthopedics;  Laterality: Right;   REVISON OF ARTERIOVENOUS FISTULA Right 07/16/2017   Procedure: REVISION OF ARTERIOVENOUS FISTULA  Right ARM;  Surgeon: Waynetta Sandy, MD;  Location: [place];  Service: Vascular;  Laterality: Right;   TOTAL HIP ARTHROPLASTY Left 11/14/2020   Procedure: LEFT TOTAL HIP ARTHROPLASTY ANTERIOR APPROACH;  Surgeon: Leandrew Koyanagi, MD;  Location: [place];  Service: Orthopedics;  Laterality: Left;   UPPER GASTROINTESTINAL ENDOSCOPY  2021   @ [facility]    Prior to Admission medications   Medication Sig Start Date End Date Taking? Authorizing Provider  acetaminophen (TYLENOL) 650 MG CR tablet Take 1,300 mg by mouth every 8 (eight) hours as needed for pain.   Yes [provider]  atorvastatin (LIPITOR) 10 MG tablet Take 10 mg by mouth 2 (two) times a week.   Yes [provider]  augmented betamethasone dipropionate (DIPROLENE-AF) 0.05 % ointment Apply 1 Application topically 2 (two) times daily as needed for rash. 08/28/21  Yes [provider]  cinacalcet (SENSIPAR) 60 MG tablet Take 120 mg by mouth daily with supper.   Yes [provider]  cromolyn (OPTICROM) 4 % ophthalmic solution Place 1 drop into both eyes daily as needed (redness).   Yes [provider]  docusate sodium (COLACE) 100 MG capsule Take 1 capsule (100 mg total) by mouth daily as needed. 11/08/20 11/08/21 Yes Aundra Dubin, PA-C  gabapentin (NEURONTIN) 100 MG capsule Take 100 mg by mouth at bedtime. 05/15/21  Yes [provider]  ibuprofen (ADVIL) 800 MG tablet Take 400 mg by mouth 2 (two) times daily as needed for moderate pain. 07/02/21  Yes [provider]  LOKELMA 10 g PACK packet Take 1 packet by mouth daily. 05/16/21  Yes [provider]  multivitamin (RENA-VIT) TABS tablet Take 1 tablet by mouth daily.   Yes [provider]  pantoprazole (PROTONIX) 40 MG tablet Take 1 tablet (40 mg total) by mouth 2 (two) times daily before a meal. 09/13/21  Yes Thurnell Lose, MD  sevelamer carbonate (RENVELA) 800 MG tablet Take 4,000 mg by mouth 3 (three) times daily with meals. 5 tabs with meals 06/12/21  Yes [provider]  sucralfate (CARAFATE) 1 GM/10ML suspension Take 10 mLs (1 g total) by mouth 2 (two) times daily. 09/13/21  Yes Thurnell Lose, MD  vitamin B-12 (CYANOCOBALAMIN) 1000 MCG tablet Take  1,000 mcg by mouth daily.   Yes [provider]  vitamin E 180 MG (400 UNITS) capsule Take 400 Units by mouth 3 (three) times a week.   Yes [provider]  amoxicillin (AMOXIL) 500 MG capsule Take 4 pills one hour prior to dental work 05/04/21   Aundra Dubin, PA-C  camphor-menthol [facility]) lotion Apply topically 2 (two) times daily. Patient not taking: Reported on 09/17/2021 11/18/20   Kayleen Memos, DO  Methoxy PEG-Epoetin Beta (MIRCERA IJ) Mircera 05/24/21 05/23/22  [provider]    Scheduled Meds:  Chlorhexidine Gluconate Cloth  6 each Topical Q0600   pantoprazole (PROTONIX) IV  40 mg Intravenous Q12H   Infusions:  sodium chloride Stopped (09/18/21 0737)   PRN Meds: acetaminophen, albuterol, ondansetron **OR** ondansetron (ZOFRAN) IV, sodium chloride   Allergies as of 09/17/2021 - Review Complete 09/17/2021  Allergen Reaction Noted   Infed [iron dextran] Other (See Comments) 06/19/2010   Oxycodone Nausea Only 06/19/2010   Vicodin [hydrocodone-acetaminophen] Other (See Comments) 06/19/2010   Diclofenac Other (See Comments) 07/01/2014    Family History  Problem Relation Age of Onset   Diabetes Father    Stroke Father    Hypertension Father    Uterine cancer Mother    Lupus Sister    Stroke Sister    Hypertension Sister    Anuerysm Brother        brain   Colon cancer Neg Hx    Esophageal cancer Neg Hx    Stomach cancer Neg Hx    Pancreatic cancer Neg Hx    Liver disease Neg Hx    Colon polyps Neg Hx    Rectal cancer Neg Hx     Social History   Socioeconomic History   Marital status: Single    Spouse name: Not on file   Number of children: 2   Years of education: Not on file   Highest education level: Not on file  Occupational History   Occupation: retired    Fish farm manager: SELF EMPLOYED  Tobacco Use   Smoking status: Former    Types: Cigarettes    Quit date: 06/19/1990    Years since quitting: 31.2   Smokeless tobacco: Never    Tobacco  comments:    rarely cigar  Vaping Use   Vaping Use: Never used  Substance and Sexual Activity   Alcohol use: Yes    Alcohol/week: 1.0 standard drink of alcohol    Types: 1 Cans of beer per week    Comment: occasional beer   Drug use: Not Currently    Types: Marijuana    Comment: Last use several yrs ago   Sexual activity: Not Currently  Other Topics Concern   Not on file  Social History Narrative   Lives alone at home   12 years of education    2 children    Not married    Social Determinants of Health   Financial Resource Strain: Not on file  Food Insecurity: Not on file  Transportation Needs: Not on file  Physical Activity: Not on file  Stress: Not on file  Social Connections: Not on file  Intimate Partner Violence: Not on file    REVIEW OF SYSTEMS: Constitutional: Some fatigue and weakness, not profound. ENT:  No nose bleeds Pulm: No shortness of breath or cough CV:  No palpitations, no LE edema.  No angina GU:  No hematuria, no frequency GI: See HPI.  Appetite is good Heme: Other than the GI bleeding, denies unusual or excessive bleeding or bruising Transfusions: See HPI.  Reminder patient is JW and at present does not want to receive transfusion with blood products. Neuro:  No headaches, no peripheral tingling or numbness.  No syncope, no seizures.  No dizziness. Derm:  No itching, no rash or sores.  Endocrine:  No sweats or chills.  No polyuria or dysuria Immunization: Reviewed. Travel:  None beyond local counties in last few months.    PHYSICAL EXAM: Vital signs in last 24 hours: Vitals:   09/18/21 0738 09/18/21 0822  BP: 133/77   Pulse: 81   Resp: 16   Temp:  97.9 F (36.6 C)  SpO2: 100%    Wt Readings from Last 3 Encounters:  09/18/21 89.6 kg  09/13/21 85.9 kg  07/03/21 90.1 kg    General: Pleasant, calm, cooperative, non-ill-appearing.  Alert. Head: No facial asymmetry or swelling.  No signs of head trauma. Eyes: No conjunctival  pallor.  No scleral icterus.  EOMI Ears: No hearing deficit Nose: No congestion or discharge Mouth: Pink, moist, clear oral mucosa.  Tongue midline. Neck: No JVD, no masses, no thyromegaly Lungs: Clear bilaterally without labored breathing or cough. Heart: RRR.  No MRG.  S1, S2 present Abdomen: Soft without tenderness.  Active bowel sounds.  No distention.  No masses, HSM, bruits, hernias.   Rectal: Red blood on DRE.  No palpable masses.  No retained stool.  Visible hemorrhoids are small. Musc/Skeltl: No joint redness, swelling or gross deformity. Extremities: No pedal edema.  Feet are warm. Neurologic: No confusion.  He is alert and fully oriented.  Able to provide excellent history.  Moves all 4 limbs without weakness or tremor. Skin: No suspicious rash, lesions. Nodes: No cervical adenopathy Psych: Pleasant with good sense of humor.  Cooperative.  Fluid speech.  Intake/Output from previous day: No intake/output data recorded. Intake/Output this shift: No intake/output data recorded.  LAB RESULTS: Recent Labs    09/17/21 1956 09/17/21 2227  WBC 9.9  --   HGB 8.2* 7.2*  HCT 27.1* 23.7*  PLT 275  --    BMET Lab Results  Component Value Date   NA 139 09/18/2021   NA 137 09/17/2021   NA 136 09/13/2021  K 6.4 (HH) 09/18/2021   K 6.2 (H) 09/17/2021   K 4.8 09/13/2021   CL 109 09/18/2021   CL 105 09/17/2021   CL 97 (L) 09/13/2021   CO2 20 (L) 09/18/2021   CO2 19 (L) 09/17/2021   CO2 23 09/13/2021   GLUCOSE 78 09/18/2021   GLUCOSE 97 09/17/2021   GLUCOSE 84 09/13/2021   BUN 75 (H) 09/18/2021   BUN 65 (H) 09/17/2021   BUN 40 (H) 09/13/2021   CREATININE 11.90 (H) 09/18/2021   CREATININE 11.25 (H) 09/17/2021   CREATININE 10.71 (H) 09/13/2021   CALCIUM 8.6 (L) 09/18/2021   CALCIUM 8.7 (L) 09/17/2021   CALCIUM 9.0 09/13/2021   LFT Recent Labs    09/17/21 1956 09/18/21 0636  PROT 6.7 6.2*  ALBUMIN 2.8* 2.6*  AST 17 17  ALT 13 11  ALKPHOS 71 54  BILITOT 0.6  0.6   PT/INR Lab Results  Component Value Date   INR 1.1 09/17/2021   INR 1.1 09/11/2021   INR 1.2 11/10/2020   Hepatitis Panel No results for input(s): "HEPBSAG", "HCVAB", "HEPAIGM", "HEPBIGM" in the last 72 hours. C-Diff No components found for: "CDIFF" Lipase     Component Value Date/Time   LIPASE 70 (H) 07/31/2019 1932    Drugs of Abuse  No results found for: "LABOPIA", "COCAINSCRNUR", "LABBENZ", "AMPHETMU", "THCU", "LABBARB"   RADIOLOGY STUDIES: No results found.    IMPRESSION:     Recurrent, ongoing painless hematochezia.  Had EGD and colonoscopy for the same less than 1 week ago.  Nonbleeding, clean-based gastric and duodenal ulcers, duodenitis, gastritis on upper.  Segmental colitis, likely ischemic and pandiverticulosis in colon.  Dr Ms opinion was that of bleeding from either the colitis or diverticulosis.  Admitting physician has initiated Protonix 40 mg IV q 12.      ABL anemia and anemia of CKD.Marland Kitchen  Pt is JH generally declines transfusion with blood products though in the past he has allowed transfusions.  Received Mircera on 7/26.  ESRD.  Hemodialysis MWF.  Bil nephrectomy for renal cell cancer.  Nephrology consult in progress.  Hyperkalemia.  This despite taking Lokelma daily    PLAN:       Per Dr Lorenso Courier.  If no plans to repeat upper endoscopy, will allow clear liquids.  Given the slowdown of the bleeding and the last bowel movement suggesting old blood, not planning to order nuclear medicine bleeding scan or CT angio.  More urgently he needs dialysis to address his hyperkalemia.  Azucena Freed  09/18/2021, 9:22 AM Phone [phone]

## 2021-09-18 NOTE — Consult Note (Addendum)
Renal Service Consult Note [facility]  [name] Sr. 09/18/2021 Sol Blazing, MD Requesting Physician: Dr. Lonny Prude  Reason for Consult: ESRD pt w/ GI bleed HPI: The patient is a 61 y.o. year-old w/ hx of GIB, jehovah's witness, anemia, depression, esrd on HD, HTN, hx RCC, seizures, hx CVA who presented to ED yesterday w/ c/o bloody stools for 2 days w/ assoc gen weakness and lightheadedness. Pt was dc'd last week on Thursday for the same issues. During that stay the EGD showed gastric and DU's but no active bleeding. Pt was rx'd w/ PPI and Hb was stable so he was dc'd 8/2. In ED BP 100/60, HR 88, RR 18, sat 100%, WBC 9, Hb 8.2 > 7.2. K= 6.2, FOB was +. Pt rec'd IV PPI and was admitted. No blood products due to [place] witness. We are asked to see for ESRD.    Pt seen in ED. No c/o's other than as above. Last HD was last Friday.     ROS - denies CP, no joint pain, no HA, no blurry vision, no rash, no diarrhea, no nausea/ vomiting, no dysuria, no difficulty voiding   Past Medical History  Past Medical History:  Diagnosis Date   Acute gastric ulcer with hemorrhage    Acute GI bleeding 07/31/2019   Acute pericarditis    Anemia    hx of   Anxiety    situational    Arthritis    on meds   Borderline diabetes    Cataract    bilateral sx   Chronic headaches    Depression    situational    Diverticulitis    ESRD (end stage renal disease) ([place])     On Renal Transplant List," [facility]; MWF" (10/23/2016)   GERD (gastroesophageal reflux disease)    with certain foods   GI bleed    Hypertension    diet controlled   Parathyroid abnormality (HCC)    ectopic parathyroid gland   Presence of arteriovenous fistula for hemodialysis, primary ([place])    RUE PER PT RLE   Refusal of blood product    NO WHOLE BLOOD PROUCTS   Renal cell carcinoma ([place])    s/p hand assisted laparoscopic bilateral nephrectomies 11/29/17, + RCC left   Secondary hyperparathyroidism ([place])     Seizures ([place])    one episode in past, due to" elevated Potassium" 08/02/20- "at least 4 years ago"   Sleep apnea    doesn't use CPAP anymore since weight loss   Stroke [facility])    no residual   Past Surgical History  Past Surgical History:  Procedure Laterality Date   AV FISTULA PLACEMENT Right    right arm   BIOPSY  08/01/2019   Procedure: BIOPSY;  Surgeon: Juanita Craver, MD;  Location: [facility] ENDOSCOPY;  Service: Endoscopy;;   BIOPSY  11/18/2020   Procedure: BIOPSY;  Surgeon: Daryel November, MD;  Location: [facility] ENDOSCOPY;  Service: Gastroenterology;;   BIOPSY  09/13/2021   Procedure: BIOPSY;  Surgeon: Irving Copas., MD;  Location: [facility] ENDOSCOPY;  Service: Gastroenterology;;   CATARACT EXTRACTION W/ INTRAOCULAR LENS  IMPLANT, BILATERAL     COLON SURGERY     COLONOSCOPY N/A 08/04/2015   Procedure: COLONOSCOPY;  Surgeon: Jerene Bears, MD;  Location: [facility] ENDOSCOPY;  Service: Endoscopy;  Laterality: N/A;   COLONOSCOPY  2017   JMP@ [facility]-good prep-mass -recall 1 yr   COLONOSCOPY N/A 09/13/2021   Procedure: COLONOSCOPY;  Surgeon: Mansouraty, Telford Nab., MD;  Location:  [place] ENDOSCOPY;  Service: Gastroenterology;  Laterality: N/A;   COLONOSCOPY WITH PROPOFOL N/A 11/25/2020   Procedure: COLONOSCOPY WITH PROPOFOL;  Surgeon: Sharyn Creamer, MD;  Location: [place];  Service: Gastroenterology;  Laterality: N/A;   ESOPHAGOGASTRODUODENOSCOPY N/A 08/01/2019   Procedure: ESOPHAGOGASTRODUODENOSCOPY (EGD);  Surgeon: Juanita Craver, MD;  Location: [facility] ENDOSCOPY;  Service: Endoscopy;  Laterality: N/A;   ESOPHAGOGASTRODUODENOSCOPY N/A 11/18/2020   Procedure: ESOPHAGOGASTRODUODENOSCOPY (EGD);  Surgeon: Daryel November, MD;  Location: [place];  Service: Gastroenterology;  Laterality: N/A;   ESOPHAGOGASTRODUODENOSCOPY (EGD) WITH PROPOFOL N/A 08/04/2019   Procedure: ESOPHAGOGASTRODUODENOSCOPY (EGD) WITH PROPOFOL;  Surgeon: Yetta Flock, MD;  Location: [place];  Service:  Gastroenterology;  Laterality: N/A;   ESOPHAGOGASTRODUODENOSCOPY (EGD) WITH PROPOFOL N/A 09/13/2021   Procedure: ESOPHAGOGASTRODUODENOSCOPY (EGD) WITH PROPOFOL;  Surgeon: Rush Landmark Telford Nab., MD;  Location: [place];  Service: Gastroenterology;  Laterality: N/A;   graft left arm Left    for dialysis x 2. Removed   HOT HEMOSTASIS  11/18/2020   Procedure: HOT HEMOSTASIS (ARGON PLASMA COAGULATION/BICAP);  Surgeon: Daryel November, MD;  Location: [place];  Service: Gastroenterology;;   INSERTION OF DIALYSIS CATHETER     Rt chest   LAPAROSCOPIC RIGHT COLECTOMY N/A 08/05/2015   Procedure: LAPAROSCOPIC RIGHT COLECTOMY- ASCENDING;  Surgeon: Stark Klein, MD;  Location: [place];  Service: General;  Laterality: N/A;   MASS EXCISION Left 05/28/2019   Procedure: EXCISION SOFT TISSUE MASS LEFT SHOULDER;  Surgeon: Armandina Gemma, MD;  Location: [facility];  Service: General;  Laterality: Left;   NEPHRECTOMY Bilateral    PARATHYROIDECTOMY N/A 06/12/2016   Procedure: TOTAL PARATHYROIDECTOMY WITH AUTOTRANSPLANTATION TO LEFT FOREARM;  Surgeon: Armandina Gemma, MD;  Location: [place];  Service: General;  Laterality: N/A;   PARATHYROIDECTOMY N/A 10/23/2016   Procedure: PARATHYROIDECTOMY;  Surgeon: Armandina Gemma, MD;  Location: [place];  Service: General;  Laterality: N/A;   REVERSE SHOULDER ARTHROPLASTY Right 08/24/2020   Procedure: REVERSE SHOULDER ARTHROPLASTY;  Surgeon: Hiram Gash, MD;  Location: [place];  Service: Orthopedics;  Laterality: Right;   REVISON OF ARTERIOVENOUS FISTULA Right 07/16/2017   Procedure: REVISION OF ARTERIOVENOUS FISTULA  Right ARM;  Surgeon: Waynetta Sandy, MD;  Location: [place];  Service: Vascular;  Laterality: Right;   TOTAL HIP ARTHROPLASTY Left 11/14/2020   Procedure: LEFT TOTAL HIP ARTHROPLASTY ANTERIOR APPROACH;  Surgeon: Leandrew Koyanagi, MD;  Location: [place];  Service: Orthopedics;  Laterality: Left;   UPPER GASTROINTESTINAL ENDOSCOPY  2021   @ [facility]   Family History   Family History  Problem Relation Age of Onset   Diabetes Father    Stroke Father    Hypertension Father    Uterine cancer Mother    Lupus Sister    Stroke Sister    Hypertension Sister    Anuerysm Brother        brain   Colon cancer Neg Hx    Esophageal cancer Neg Hx    Stomach cancer Neg Hx    Pancreatic cancer Neg Hx    Liver disease Neg Hx    Colon polyps Neg Hx    Rectal cancer Neg Hx    Social History  reports that he quit smoking about 31 years ago. His smoking use included cigarettes. He has never used smokeless tobacco. He reports current alcohol use of about 1.0 standard drink of alcohol per week. He reports that he does not currently use drugs after having used the following drugs: Marijuana. Allergies  Allergies  Allergen Reactions  Infed [Iron Dextran] Other (See Comments)    Decreased BP    Oxycodone Nausea Only   Vicodin [Hydrocodone-Acetaminophen] Other (See Comments)    Decreased BP   Diclofenac Other (See Comments)    Unknown   Home medications Prior to Admission medications   Medication Sig Start Date End Date Taking? Authorizing Provider  acetaminophen (TYLENOL) 650 MG CR tablet Take 1,300 mg by mouth every 8 (eight) hours as needed for pain.   Yes [provider]  atorvastatin (LIPITOR) 10 MG tablet Take 10 mg by mouth 2 (two) times a week.   Yes [provider]  augmented betamethasone dipropionate (DIPROLENE-AF) 0.05 % ointment Apply 1 Application topically 2 (two) times daily as needed for rash. 08/28/21  Yes [provider]  cinacalcet (SENSIPAR) 60 MG tablet Take 120 mg by mouth daily with supper.   Yes [provider]  cromolyn (OPTICROM) 4 % ophthalmic solution Place 1 drop into both eyes daily as needed (redness).   Yes [provider]  docusate sodium (COLACE) 100 MG capsule Take 1 capsule (100 mg total) by mouth daily as needed. 11/08/20 11/08/21 Yes Aundra Dubin, PA-C  gabapentin (NEURONTIN) 100 MG  capsule Take 100 mg by mouth at bedtime. 05/15/21  Yes [provider]  ibuprofen (ADVIL) 800 MG tablet Take 400 mg by mouth 2 (two) times daily as needed for moderate pain. 07/02/21  Yes [provider]  LOKELMA 10 g PACK packet Take 1 packet by mouth daily. 05/16/21  Yes [provider]  multivitamin (RENA-VIT) TABS tablet Take 1 tablet by mouth daily.   Yes [provider]  pantoprazole (PROTONIX) 40 MG tablet Take 1 tablet (40 mg total) by mouth 2 (two) times daily before a meal. 09/13/21  Yes Thurnell Lose, MD  sevelamer carbonate (RENVELA) 800 MG tablet Take 4,000 mg by mouth 3 (three) times daily with meals. 5 tabs with meals 06/12/21  Yes [provider]  sucralfate (CARAFATE) 1 GM/10ML suspension Take 10 mLs (1 g total) by mouth 2 (two) times daily. 09/13/21  Yes Thurnell Lose, MD  vitamin B-12 (CYANOCOBALAMIN) 1000 MCG tablet Take 1,000 mcg by mouth daily.   Yes [provider]  vitamin E 180 MG (400 UNITS) capsule Take 400 Units by mouth 3 (three) times a week.   Yes [provider]  amoxicillin (AMOXIL) 500 MG capsule Take 4 pills one hour prior to dental work 05/04/21   Aundra Dubin, PA-C  camphor-menthol [facility]) lotion Apply topically 2 (two) times daily. Patient not taking: Reported on 09/17/2021 11/18/20   Kayleen Memos, DO  Methoxy PEG-Epoetin Beta (MIRCERA IJ) Mircera 05/24/21 05/23/22  [provider]     Vitals:   09/18/21 6948 09/18/21 0700 09/18/21 0738 09/18/21 0822  BP: (!) 135/91 134/76 133/77   Pulse: 89  81   Resp: '16 14 16   '$ Temp:    97.9 F (36.6 C)  TempSrc:    Oral  SpO2: 98%  100%   Weight:       Exam Gen alert, no distress No rash, cyanosis or gangrene Sclera anicteric, throat clear  No jvd or bruits Chest clear bilat to bases, no rales/ wheezing RRR no MRG Abd soft ntnd no mass or ascites +bs GU normal MS no joint effusions or deformity Ext no LE or UE edema, no wounds or  ulcers Neuro is alert, Ox 3 , nf    RFA AVF+bruit  Home meds include - atorvastatin, cinacalcet 120 hs, gabapentin 100 hs, lokelma prn, pantoprazole, sevelamer carbonate 5 ac tid, sucralfate 1g bid, prns/ vits/ supps     OP HD: MWF NW  3h 5mn    400/700   87.5kg  2K/3Ca bath  Hep 3500  AVF RFA - last HD 8/4, post wt 87kg, idwg's low recently - last Hb 9.8 on 8/4, last tsat 32% and ferritin 1435 in July - last mircera 75 ug on 7/26, q2, due next week - calcitriol 1.75 ug tiw po  Assessment/ Plan: GI bleed - recurrent, here last week w/ EGF showing nonbleeding GU/ DU's. Hb down, FOB+. Per pmd/ GI.  No blood products, pt refuses (+Jehovah Witness).  ESRD - on HD MWF. HD today.  Hyperkalemia - K+ 6.4, low k+ bath w/ HD today.  Anemia esrd - Hb 7s w/ active GIB. Next ESA is due on 8/09. Will give early (darbe 100 ug weekly on Monday) given dropping Hb and JH status. Get tsat and ferritin w hd today.   MBD ckd - CCa and phos in range, cont sevelamer as binder, cont po vdra w/ HD.   BP/ vol - up 2kg, no gross vol overload, BP's wnl, not on BP lowering meds at home. UF goal 2 L today.  H/o CVA    Rob SJonnie Finner MD 09/18/2021, 9:42 AM Recent Labs  Lab 09/12/21 [id number]/02/[id number] 0729 09/17/21 1956 09/17/21 2227 09/18/21 0636 09/18/21 0850  HGB 11.3* 10.8* 8.2* 7.2*  --  7.4*  ALBUMIN 2.6* 2.7* 2.8*  --  2.6*  --   CALCIUM 9.0 9.0 8.7*  --  8.6*  --   PHOS 3.4 6.1*  --   --   --   --   CREATININE 7.12* 10.71* 11.25*  --  11.90*  --   K 3.4* 4.8 6.2*  --  6.4*  --

## 2021-09-18 NOTE — ED Notes (Signed)
GI at bedside

## 2021-09-19 ENCOUNTER — Inpatient Hospital Stay (HOSPITAL_COMMUNITY): Payer: Medicare Other

## 2021-09-19 DIAGNOSIS — I10 Essential (primary) hypertension: Secondary | ICD-10-CM | POA: Diagnosis not present

## 2021-09-19 DIAGNOSIS — N186 End stage renal disease: Secondary | ICD-10-CM | POA: Diagnosis not present

## 2021-09-19 DIAGNOSIS — K922 Gastrointestinal hemorrhage, unspecified: Secondary | ICD-10-CM | POA: Diagnosis not present

## 2021-09-19 DIAGNOSIS — E875 Hyperkalemia: Secondary | ICD-10-CM | POA: Diagnosis not present

## 2021-09-19 LAB — CBC
HCT: 22.6 % — ABNORMAL LOW (ref 39.0–52.0)
HCT: 21.2 % — ABNORMAL LOW (ref 39.0–52.0)
Hemoglobin: 7.4 g/dL — ABNORMAL LOW (ref 13.0–17.0)
Hemoglobin: 6.7 g/dL — CL (ref 13.0–17.0)
MCH: 29.2 pg (ref 26.0–34.0)
MCH: 28.8 pg (ref 26.0–34.0)
MCHC: 32.7 g/dL (ref 30.0–36.0)
MCHC: 31.6 g/dL (ref 30.0–36.0)
MCV: 89.3 fL (ref 80.0–100.0)
MCV: 91 fL (ref 80.0–100.0)
Platelets: 216 10*3/uL (ref 150–400)
Platelets: 217 10*3/uL (ref 150–400)
RBC: 2.53 MIL/uL — ABNORMAL LOW (ref 4.22–5.81)
RBC: 2.33 MIL/uL — ABNORMAL LOW (ref 4.22–5.81)
RDW: 17.2 % — ABNORMAL HIGH (ref 11.5–15.5)
RDW: 17.3 % — ABNORMAL HIGH (ref 11.5–15.5)
WBC: 9.9 10*3/uL (ref 4.0–10.5)
WBC: 7.6 10*3/uL (ref 4.0–10.5)
nRBC: 0 % (ref 0.0–0.2)
nRBC: 0 % (ref 0.0–0.2)

## 2021-09-19 LAB — IRON AND TIBC
Iron: 23 ug/dL — ABNORMAL LOW (ref 45–182)
Saturation Ratios: 11 % — ABNORMAL LOW (ref 17.9–39.5)
TIBC: 210 ug/dL — ABNORMAL LOW (ref 250–450)
UIBC: 187 ug/dL

## 2021-09-19 LAB — RENAL FUNCTION PANEL
Albumin: 2.4 g/dL — ABNORMAL LOW (ref 3.5–5.0)
Anion gap: 12 (ref 5–15)
BUN: 41 mg/dL — ABNORMAL HIGH (ref 8–23)
CO2: 27 mmol/L (ref 22–32)
Calcium: 8.6 mg/dL — ABNORMAL LOW (ref 8.9–10.3)
Chloride: 97 mmol/L — ABNORMAL LOW (ref 98–111)
Creatinine, Ser: 8.38 mg/dL — ABNORMAL HIGH (ref 0.61–1.24)
GFR, Estimated: 7 mL/min — ABNORMAL LOW (ref >60)
Glucose, Bld: 109 mg/dL — ABNORMAL HIGH (ref 70–99)
Phosphorus: 4.3 mg/dL (ref 2.5–4.6)
Potassium: 4.7 mmol/L (ref 3.5–5.1)
Sodium: 136 mmol/L (ref 135–145)

## 2021-09-19 LAB — FERRITIN: Ferritin: 1494 ng/mL — ABNORMAL HIGH (ref 24–336)

## 2021-09-19 MED ORDER — SODIUM CHLORIDE 0.9 % IV SOLN
125.0000 mg | INTRAVENOUS | Status: DC
  Filled 2021-09-19 (×3): qty 10

## 2021-09-19 MED ORDER — SODIUM CHLORIDE 0.9 % IV BOLUS
250.0000 mL | Freq: Once | INTRAVENOUS | Status: AC
  Administered 2021-09-19: 250 mL via INTRAVENOUS

## 2021-09-19 MED ORDER — PANTOPRAZOLE SODIUM 40 MG PO TBEC
40.0000 mg | DELAYED_RELEASE_TABLET | Freq: Two times a day (BID) | ORAL | Status: DC
  Administered 2021-09-19 – 2021-09-28 (×18): 40 mg via ORAL
  Filled 2021-09-19 (×18): qty 1

## 2021-09-19 NOTE — Progress Notes (Signed)
Daily Rounding Note  09/19/2021, 9:40 AM  LOS: 2 days   SUBJECTIVE:   Chief complaint:   Recurrent GI bleeding.  Feeling well.  Had a burgundy bloody stool, large volume, at 4 AM this morning.  Less voluminous burgundy stool 9 AM today.  No dizziness, no weakness.  No shortness of breath, no chest pain.  No palpitations.  OBJECTIVE:         Vital signs in last 24 hours:    Temp:  [97.7 F (36.5 C)-98.4 F (36.9 C)] 98.4 F (36.9 C) (08/08 0705) Pulse Rate:  [64-117] 99 (08/08 0705) Resp:  [11-21] 18 (08/08 0559) BP: (75-149)/(45-110) 75/65 (08/08 0705) SpO2:  [98 %-100 %] 99 % (08/07 2300) Weight:  [87.2 kg-89.2 kg] 87.2 kg (08/07 1845) Last BM Date : 09/19/21 Filed Weights   09/18/21 0313 09/18/21 1401 09/18/21 1845  Weight: 89.6 kg 89.2 kg 87.2 kg   General: NAD.  Looks tired but not unwell. Heart: RRR. Chest: Clear bilaterally. Abdomen: Soft without tenderness.  Active bowel sounds.  No distention. Extremities: No CCE. Neuro/Psych: Pleasant, calm, cooperative.  Fluid speech.  No gross deficits.  Observed walking without difficulty in the room.  Intake/Output from previous day: 08/07 0701 - 08/08 0700 In: 480 [P.O.:480] Out: 1700   Intake/Output this shift: No intake/output data recorded.  Lab Results: Recent Labs    09/17/21 1956 09/17/21 2227 09/18/21 0850 09/18/21 2258  WBC 9.9  --  12.1* 9.9  HGB 8.2* 7.2* 7.4* 7.4*  HCT 27.1* 23.7* 24.4* 22.6*  PLT 275  --  227 216   BMET Recent Labs    09/17/21 1956 09/18/21 0636  NA 137 139  K 6.2* 6.4*  CL 105 109  CO2 19* 20*  GLUCOSE 97 78  BUN 65* 75*  CREATININE 11.25* 11.90*  CALCIUM 8.7* 8.6*   LFT Recent Labs    09/17/21 1956 09/18/21 0636  PROT 6.7 6.2*  ALBUMIN 2.8* 2.6*  AST 17 17  ALT 13 11  ALKPHOS 71 54  BILITOT 0.6 0.6   PT/INR Recent Labs    09/17/21 2227  LABPROT 14.3  INR 1.1   Hepatitis Panel No results for  input(s): "HEPBSAG", "HCVAB", "HEPAIGM", "HEPBIGM" in the last 72 hours.  Studies/Results: No results found.  Scheduled Meds:  atorvastatin  10 mg Oral Once per day on Mon Thu   calcitRIOL  1.75 mcg Oral Q M,W,F-HD   Chlorhexidine Gluconate Cloth  6 each Topical Q0600   darbepoetin (ARANESP) injection - DIALYSIS  100 mcg Intravenous Q Mon-HD   pantoprazole (PROTONIX) IV  40 mg Intravenous Q12H   Continuous Infusions:  sodium chloride Stopped (09/18/21 0737)   sodium chloride     PRN Meds:.acetaminophen, albuterol, ondansetron **OR** ondansetron (ZOFRAN) IV, sodium chloride, traMADol   ASSESMENT:   Recurrent painless hematochezia.  EGD/colonoscopy last week with clean-based, nonbleeding gastric and duodenal ulcers, duodenitis, gastritis, segmental (likely ischemic) colitis, pandiverticulosis.  ABL anemia and anemia of CKD.  Religious beliefs do not allow for blood products.  Yesterday's iron studies with low iron, low TIBC, low iron sats, ferritin 1500.    ESRD.  HD MWF.      Hyperkalemia.  Await repeat K level today.     PLAN   Continue supportive care, trend Hgb, Protonix bid (now switching to po).  Advanced diet to renal.  ? Order Nuc RBC scan vs CT angio bleeding study?     [name]  09/19/2021, 9:40 AM Phone [phone]

## 2021-09-19 NOTE — Progress Notes (Signed)
PROGRESS NOTE    Carl Leonetti Sr.  BZJ:[id number] DOB: March 30, 1960 DOA: 09/17/2021 PCP: [name], MD   Brief Narrative: Carl Found Sr. is a 61 y.o. male with a history of ESRD on HD, CVA, hypertension, diverticulosis, hemorrhoids, ischemic colitis, OSA not on CPAP, GERD, PUD, migraines. Patient presented secondary to recurrent bloody stools.   Assessment and Plan:  Recurrent GI bleed Recently diagnosed with ischemic colitis in addition to history of PUD and diverticulosis. Recent EGD and colonoscopy performed. GI consulted with recommendations for supportive care. Patient continues to have bloody bowel movements -Continue supportive care -Continue Protonix -CBC in AM -GI recommendations: pending today  Hypotension Secondary to GI bleeding and resultant acute blood loss anemia. -250 mL NS bolus  Acute on chronic anemia Acute blood loss anemia Acute anemia secondary to GI bleeding. Patient is a Sales promotion account executive witness and does not accept blood products, including albumin. This was clarified with the patient on 8/7 and 8/8. Patient received epo with hemodialysis. Nephrology planning to give IV iron. Iron panel consistent with anemia of chronic disease. -CBC in AM with HD  ESRD on HD Nephrology consulted for hemodialysis while inpatient.  Hyperkalemia Potassium of 6.4. Patient received hemodialysis on 8/7.  Primary hypertension Patient is not on antihypertensives as an outpatient at this time.  Hyperlipidemia Patient is on Lipitor as an outpatient.  Left hand numbness Likely related to positioning. Does not seem consistent with central insult.  OSA on CPAP Patient is nonadherent as an outpatient  DVT prophylaxis: SCDs Code Status:   Code Status: Full Code Family Communication: None at bedside Disposition Plan: Discharge home pending GI workup/management and pending improvement of GI bleeding   Consultants:  [facility]  Procedures:   Hemodialysis  Antimicrobials: None    Subjective: Patient reports continued bloody bowel movements. From overnight/this morning. No abdominal pain. Also still has some left hand numbness that is worse with certain hand positions, especially when on his arm.  Objective: BP (!) 88/58 (BP Location: Left Leg)   Pulse 97   Temp 98.4 F (36.9 C) (Oral)   Resp 18   Ht '6\' 2"'$  (1.88 m)   Wt 87.2 kg   SpO2 99%   BMI 24.68 kg/m   Examination:  General exam: Appears calm and comfortable Respiratory system: Clear to auscultation. Respiratory effort normal. Cardiovascular system: S1 & S2 heard, RRR. Gastrointestinal system: Abdomen is nondistended, soft and nontender. Normal bowel sounds heard. Central nervous system: Alert and oriented. No focal neurological deficits. Musculoskeletal: No calf tenderness Skin: No cyanosis. No rashes Psychiatry: Judgement and insight appear normal. Mood & affect appropriate.    Data Reviewed: I have personally reviewed following labs and imaging studies  CBC Lab Results  Component Value Date   WBC 7.6 09/19/2021   RBC 2.33 (L) 09/19/2021   HGB 6.7 (LL) 09/19/2021   HCT 21.2 (L) 09/19/2021   MCV 91.0 09/19/2021   MCH 28.8 09/19/2021   PLT 217 09/19/2021   MCHC 31.6 09/19/2021   RDW 17.3 (H) 09/19/2021   LYMPHSABS 1.0 09/11/2021   MONOABS 0.7 09/11/2021   EOSABS 0.3 09/11/2021   BASOSABS 0.1 01/75/1025     Last metabolic panel Lab Results  Component Value Date   NA 136 09/19/2021   K 4.7 09/19/2021   CL 97 (L) 09/19/2021   CO2 27 09/19/2021   BUN 41 (H) 09/19/2021   CREATININE 8.38 (H) 09/19/2021   GLUCOSE 109 (H) 09/19/2021   GFRNONAA 7 (L) 09/19/2021  GFRAA 5 (L) 08/05/2019   CALCIUM 8.6 (L) 09/19/2021   PHOS 4.3 09/19/2021   PROT 6.2 (L) 09/18/2021   ALBUMIN 2.4 (L) 09/19/2021   BILITOT 0.6 09/18/2021   ALKPHOS 54 09/18/2021   AST 17 09/18/2021   ALT 11 09/18/2021   ANIONGAP 12 09/19/2021    GFR: Estimated Creatinine  Clearance: 10.6 mL/min (A) (by C-G formula based on SCr of 8.38 mg/dL (H)).  No results found for this or any previous visit (from the past 240 hour(s)).    Radiology Studies: No results found.    LOS: 2 days    Cordelia Poche, MD [facility] 09/19/2021, 12:27 PM   If 7PM-7AM, please contact night-coverage [email or web address]

## 2021-09-19 NOTE — Progress Notes (Addendum)
[facility] Progress Note   Subjective: Completed dialysis yesterday. Feels 'raggedy' Endorses bloody BM this am. Hgb 6.7. No chest pain, dyspnea, dizziness.   Objective Vitals:   09/19/21 0126 09/19/21 0559 09/19/21 0705 09/19/21 1045  BP: (!) 88/45 (!) 80/56 (!) 75/65 (!) 88/58  Pulse:   99 97  Resp:  18    Temp:  98.3 F (36.8 C) 98.4 F (36.9 C)   TempSrc:  Oral Oral   SpO2:      Weight:      Height:          Additional Objective Labs: Basic Metabolic Panel: Recent Labs  Lab 09/13/21 0729 09/17/21 1956 09/18/21 0636  NA 136 137 139  K 4.8 6.2* 6.4*  CL 97* 105 109  CO2 23 19* 20*  GLUCOSE 84 97 78  BUN 40* 65* 75*  CREATININE 10.71* 11.25* 11.90*  CALCIUM 9.0 8.7* 8.6*  PHOS 6.1*  --   --    CBC: Recent Labs  Lab 09/13/21 0729 09/17/21 1956 09/17/21 2227 09/18/21 0850 09/18/21 2258 09/19/21 0834  WBC 5.6 9.9  --  12.1* 9.9 7.6  HGB 10.8* 8.2*   < > 7.4* 7.4* 6.7*  HCT 33.4* 27.1*   < > 24.4* 22.6* 21.2*  MCV 88.8 94.8  --  94.2 89.3 91.0  PLT 289 275  --  227 216 217   < > = values in this interval not displayed.   Blood Culture    Component Value Date/Time   SDES BLOOD LEFT HAND 08/01/2015 0624   SPECREQUEST BOTTLES DRAWN AEROBIC AND ANAEROBIC  5CC 08/01/2015 0624   CULT NO GROWTH 5 DAYS 08/01/2015 0624   REPTSTATUS 08/06/2015 FINAL 08/01/2015 1660     Physical Exam General: Alert, nad  Heart: RRR  Lungs: Clear bilaterally  Abdomen: soft non- tender Extremities: No sig LE edema  Dialysis Access: R AVF   Medications:  sodium chloride Stopped (09/18/21 0737)    atorvastatin  10 mg Oral Once per day on Mon Thu   calcitRIOL  1.75 mcg Oral Q M,W,F-HD   Chlorhexidine Gluconate Cloth  6 each Topical Q0600   darbepoetin (ARANESP) injection - DIALYSIS  100 mcg Intravenous Q Mon-HD   pantoprazole  40 mg Oral BID    Home meds include - atorvastatin, cinacalcet 120 hs, gabapentin 100 hs, lokelma prn, pantoprazole, sevelamer  carbonate 5 ac tid, sucralfate 1g bid, prns/ vits/ supps      OP HD: MWF NW  3h 73mn    400/700   87.5kg  2K/3Ca bath  Hep 3500  AVF RFA - last HD 8/4, post wt 87kg, idwg's low recently - last Hb 9.8 on 8/4, last tsat 32% and ferritin 1435 in July - last mircera 75 ug on 7/26, q2, due next week - calcitriol 1.75 ug tiw po   Assessment/ Plan: GI bleed - recurrent, here last week w/ EGF showing nonbleeding GU/ DU's. Hb down, FOB+. Per pmd/ GI.  No blood products, pt refuses (+Jehovah Witness).  ESRD - on HD MWF. Next HD 8/9.  Hyperkalemia - Corrected with HD yesterday. Check renal panel today.  Anemia esrd - Hb dropping 7.4 > 6.7  w/ active GIB. Received ESA early (darbe 100 ug  on 8/7) given dropping Hb and JH status. Tsat 11% Ferritin elevated but will order Fe bolus here dt/ worsening anemia. Allergy to iron dextran in chart but has been receiving IV Fe as outpatient.  MBD ckd - CCa and phos  in range, cont sevelamer as binder, cont po vdra w/ HD.   BP/ vol - BP soft today. Not BP lowering meds. Got 247m fluid bolus today . Now at dry weight. Keep even with next HD H/o CVA    Izzy Courville GLarina EarthlyPA-C [facility] 09/19/2021,10:50 AM

## 2021-09-19 NOTE — Plan of Care (Signed)
  Problem: Nutrition: Goal: Adequate nutrition will be maintained Outcome: Progressing   Problem: Safety: Goal: Ability to remain free from injury will improve Outcome: Progressing   Problem: Skin Integrity: Goal: Risk for impaired skin integrity will decrease Outcome: Progressing   

## 2021-09-19 NOTE — Progress Notes (Signed)
Dear Doctor: This patient has been identified as a candidate for CVC for the following reason (s): poor veins/poor circulatory system (CHF, COPD, emphysema, diabetes, steroid use, IV drug abuse, etc.) If you agree, please write an order for the indicated device.   Thank you for supporting the early vascular access assessment program. 

## 2021-09-19 NOTE — Progress Notes (Signed)
This nurse assessed L arm for PIV placement for CT scan. This patient has very poor vasculature. No further veinous access appropriate for PIV placement. R arm restricted. Carl Lowes, RN VAST

## 2021-09-19 NOTE — Plan of Care (Addendum)
A&Ox4, independent. O2 very difficult to obtain on extremities. Per MD OK to take BP in left arm - old fistula. Working fistula to right arm. Charge RN aware, pt requests telebox & declines spo2.    Problem: Education: Goal: Knowledge of General Education information will improve Description: Including pain rating scale, medication(s)/side effects and non-pharmacologic comfort measures Outcome: Progressing   Problem: Health Behavior/Discharge Planning: Goal: Ability to manage health-related needs will improve Outcome: Progressing   Problem: Clinical Measurements: Goal: Ability to maintain clinical measurements within normal limits will improve Outcome: Progressing Goal: Will remain free from infection Outcome: Progressing Goal: Diagnostic test results will improve Outcome: Progressing Goal: Respiratory complications will improve Outcome: Progressing Goal: Cardiovascular complication will be avoided Outcome: Progressing   Problem: Activity: Goal: Risk for activity intolerance will decrease Outcome: Progressing   Problem: Nutrition: Goal: Adequate nutrition will be maintained Outcome: Progressing   Problem: Coping: Goal: Level of anxiety will decrease Outcome: Progressing   Problem: Elimination: Goal: Will not experience complications related to bowel motility Outcome: Progressing Goal: Will not experience complications related to urinary retention Outcome: Progressing   Problem: Pain Managment: Goal: General experience of comfort will improve Outcome: Progressing   Problem: Safety: Goal: Ability to remain free from injury will improve Outcome: Progressing   Problem: Skin Integrity: Goal: Risk for impaired skin integrity will decrease Outcome: Progressing

## 2021-09-20 ENCOUNTER — Inpatient Hospital Stay (HOSPITAL_COMMUNITY): Payer: Medicare Other

## 2021-09-20 ENCOUNTER — Inpatient Hospital Stay: Payer: Self-pay

## 2021-09-20 DIAGNOSIS — K922 Gastrointestinal hemorrhage, unspecified: Secondary | ICD-10-CM | POA: Diagnosis not present

## 2021-09-20 LAB — BASIC METABOLIC PANEL
Anion gap: 16 — ABNORMAL HIGH (ref 5–15)
BUN: 55 mg/dL — ABNORMAL HIGH (ref 8–23)
CO2: 27 mmol/L (ref 22–32)
Calcium: 8.6 mg/dL — ABNORMAL LOW (ref 8.9–10.3)
Chloride: 94 mmol/L — ABNORMAL LOW (ref 98–111)
Creatinine, Ser: 10.12 mg/dL — ABNORMAL HIGH (ref 0.61–1.24)
GFR, Estimated: 5 mL/min — ABNORMAL LOW (ref >60)
Glucose, Bld: 92 mg/dL (ref 70–99)
Potassium: 4.8 mmol/L (ref 3.5–5.1)
Sodium: 137 mmol/L (ref 135–145)

## 2021-09-20 LAB — CBC
HCT: 18.2 % — ABNORMAL LOW (ref 39.0–52.0)
Hemoglobin: 6 g/dL — CL (ref 13.0–17.0)
MCH: 29.7 pg (ref 26.0–34.0)
MCHC: 33 g/dL (ref 30.0–36.0)
MCV: 90.1 fL (ref 80.0–100.0)
Platelets: 205 10*3/uL (ref 150–400)
RBC: 2.02 MIL/uL — ABNORMAL LOW (ref 4.22–5.81)
RDW: 17.2 % — ABNORMAL HIGH (ref 11.5–15.5)
WBC: 7 10*3/uL (ref 4.0–10.5)
nRBC: 0 % (ref 0.0–0.2)

## 2021-09-20 LAB — PREPARE RBC (CROSSMATCH)

## 2021-09-20 MED ORDER — GUAIFENESIN 100 MG/5ML PO LIQD: 5.0000 mL | ORAL | Status: DC | PRN

## 2021-09-20 MED ORDER — METOPROLOL TARTRATE 5 MG/5ML IV SOLN: 5.0000 mg | INTRAVENOUS | Status: DC | PRN

## 2021-09-20 MED ORDER — SENNOSIDES-DOCUSATE SODIUM 8.6-50 MG PO TABS: 1.0000 | ORAL_TABLET | Freq: Every evening | ORAL | Status: DC | PRN

## 2021-09-20 MED ORDER — HYDRALAZINE HCL 20 MG/ML IJ SOLN: 10.0000 mg | INTRAMUSCULAR | Status: DC | PRN

## 2021-09-20 MED ORDER — SODIUM CHLORIDE 0.9% IV SOLUTION: Freq: Once | INTRAVENOUS | Status: DC

## 2021-09-20 MED ORDER — TRAZODONE HCL 50 MG PO TABS
50.0000 mg | ORAL_TABLET | Freq: Every evening | ORAL | Status: DC | PRN
  Filled 2021-09-20: qty 1

## 2021-09-20 MED ORDER — SODIUM CHLORIDE 0.9% FLUSH
10.0000 mL | Freq: Two times a day (BID) | INTRAVENOUS | Status: DC
  Administered 2021-09-21 – 2021-09-28 (×13): 10 mL

## 2021-09-20 MED ORDER — ALBUMIN HUMAN 25 % IV SOLN
INTRAVENOUS | Status: AC
  Filled 2021-09-20: qty 100

## 2021-09-20 MED ORDER — SODIUM CHLORIDE 0.9% FLUSH
10.0000 mL | INTRAVENOUS | Status: DC | PRN
  Administered 2021-09-22: 10 mL

## 2021-09-20 NOTE — Progress Notes (Addendum)
Received patient in bed to unit.  Alert and oriented.  Informed consent signed and in  chart. Obtained consent for blood transfusion. Placed patient in trendelenburg d/t sbp 60s, pt asymptomatic  Treatment initiated: 0940 Treatment completed: 4562  Patient tolerated well.  Transported back to the room  alert, without acute distress.  Hand-off given to patient's nurse.   Access used: fistula right lower arm Access issues: none  Total UF removed: 0 Medication(s) given: 2 units PRBCs, calcitriol Post HD VS: 128/64 HR 101 Sat 100 room air RR 14 Temp oral 97.9 Post HD weight: kg   [facility]

## 2021-09-20 NOTE — Progress Notes (Signed)
Pt. Had no available veins for PIV. Spoke with Dr. Reesa Chew at length. Dr. Reesa Chew also spoke with Dr. Jonnie Finner (nephrology). Both agreed that a PICC or midline is appropriate at this time. Dr. Reesa Chew stated midline will be sufficient for CT exam. After test, it can be determined tomorrow if permanent tunneled catheter would be appropriate. RN also aware.

## 2021-09-20 NOTE — Progress Notes (Signed)
Patient had another large bloody BM. GI requesting CTA stat.  Limited IV access. Hemodynamically stable for now.  Spoke with Dr Jonnie Finner. Ok to place Midline or PICC for now. Eventually if needed, we will swap it out with IR.   Gerlean Ren MD

## 2021-09-20 NOTE — Progress Notes (Addendum)
[facility] Progress Note   Subjective:  Hgb 6.0 this am. Reportedly had agreed to blood transfusion. Orders placed.  Seen in room before dialysis. Denies chest pain, dyspnea. When I asked him directly if he agrees to transfusion he says "we'll see what happens"   Objective Vitals:   09/20/21 0000 09/20/21 0416 09/20/21 0443 09/20/21 0749  BP: (!) 126/90 (!) 97/51  119/86  Pulse:  75  (!) 115  Resp:  18    Temp:  97.8 F (36.6 C)  97.8 F (36.6 C)  TempSrc:  Oral  Oral  SpO2:      Weight:   94 kg   Height:         Additional Objective Labs: Basic Metabolic Panel: Recent Labs  Lab 09/18/21 0636 09/19/21 1109 09/20/21 0457  NA 139 136 137  K 6.4* 4.7 4.8  CL 109 97* 94*  CO2 20* 27 27  GLUCOSE 78 109* 92  BUN 75* 41* 55*  CREATININE 11.90* 8.38* 10.12*  CALCIUM 8.6* 8.6* 8.6*  PHOS  --  4.3  --     CBC: Recent Labs  Lab 09/17/21 1956 09/17/21 2227 09/18/21 0850 09/18/21 2258 09/19/21 0834 09/20/21 0457  WBC 9.9  --  12.1* 9.9 7.6 7.0  HGB 8.2*   < > 7.4* 7.4* 6.7* 6.0*  HCT 27.1*   < > 24.4* 22.6* 21.2* 18.2*  MCV 94.8  --  94.2 89.3 91.0 90.1  PLT 275  --  227 216 217 205   < > = values in this interval not displayed.    Blood Culture    Component Value Date/Time   SDES BLOOD LEFT HAND 08/01/2015 0624   SPECREQUEST BOTTLES DRAWN AEROBIC AND ANAEROBIC  5CC 08/01/2015 0624   CULT NO GROWTH 5 DAYS 08/01/2015 0624   REPTSTATUS 08/06/2015 FINAL 08/01/2015 2458     Physical Exam General: Alert, nad  Heart: RRR  Lungs: Clear bilaterally  Abdomen: soft non- tender Extremities: No sig LE edema  Dialysis Access: R AVF   Medications:  sodium chloride Stopped (09/18/21 0737)    sodium chloride   Intravenous Once   atorvastatin  10 mg Oral Once per day on Mon Thu   calcitRIOL  1.75 mcg Oral Q M,W,F-HD   Chlorhexidine Gluconate Cloth  6 each Topical Q0600   darbepoetin (ARANESP) injection - DIALYSIS  100 mcg Intravenous Q Mon-HD    pantoprazole  40 mg Oral BID    Home meds include - atorvastatin, cinacalcet 120 hs, gabapentin 100 hs, lokelma prn, pantoprazole, sevelamer carbonate 5 ac tid, sucralfate 1g bid, prns/ vits/ supps      OP HD: MWF [facility]  3h 67mn    400/700   87.5kg  2K/3Ca bath  Hep 3500  AVF RFA - last HD 8/4, post wt 87kg, idwg's low recently - last Hb 9.8 on 8/4, last tsat 32% and ferritin 1435 in July - last mircera 75 ug on 7/26, q2, due next week - calcitriol 1.75 ug tiw po   Assessment/ Plan: GI bleed - recurrent, here last week w/ EGF showing nonbleeding GU/ DU's. Hb down, FOB+. Per pmd/ GI.  No blood products, pt refuses (+Jehovah Witness). Did agree to transfusion when talking with his primary nephrologist. Orders for 2 u prbcs placed this am.  ESRD - on HD MWF. HD today  Hyperkalemia - Corrected with HD.  Anemia esrd - Hb dropping 7.4 > 6.7> 6.0  w/ active GIB. Received ESA early (darbe 100  ug  on 8/7)  Tsat 11% Ferritin elevated but will order Fe bolus here dt/ worsening anemia. Allergy to iron dextran in chart but has been receiving IV Fe as outpatient.  MBD ckd - CCa and phos in range, cont sevelamer as binder, cont po vdra w/ HD.   BP/ vol - BP ok. Not BP lowering meds.  Got small fluid bolus. Now at dry weight. Keep even with next HD H/o CVA    Lynnda Child PA-C [facility] 09/20/2021,9:05 AM  Pt seen, examined and agree w assess/plan as above with additions as indicated. Hb down to 6.0, pt agreed to take blood products, have ordered 2u to be given today. For ESRD patients when PIV cannot be found, usual next options are either 3-lumen (IJ, fem) or can consult IR for central (IJ) picc line.  [facility] 09/20/2021, 11:03 AM

## 2021-09-20 NOTE — Progress Notes (Signed)
PROGRESS NOTE    Carl Fagin Sr.  JQZ:[id number] DOB: 1960-06-30 DOA: 09/17/2021 PCP: [name], MD   Brief Narrative:  61 y.o. male with a history of ESRD on HD, CVA, hypertension, diverticulosis, hemorrhoids, ischemic colitis, OSA not on CPAP, GERD, PUD, migraines. Patient presented secondary to recurrent bloody stools. GI and Nephro were consulted. PPI BID was started.  Recent EGD/C-scope that showed clean based nonbleeding gastric and duodenal ulcer with segmental colitis, duodenitis, gastritis and pandiverticulosis. Agreed for 2U PRBC.    Assessment & Plan:  Active Problems:   Anemia   GI bleed   Recurrent GI bleed -Recent EGD/C-scope that showed clean based nonbleeding gastric and duodenal ulcer with segmental colitis, duodenitis, gastritis and pandiverticulosis. Agreed to 2U PRBC transfusion after he discussed with Nephro -CTA pending.    Hypotension Chronic hypotension. Can try midodrine if needed.    Acute on chronic anemia Acute blood loss anemia Jehovah's Witness, does not accept any blood products including albumin.  Received EPO during HD.  IV iron  ESRD on HD Nephrology following.   Hyperkalemia S/p HD 8/9   Primary hypertension Not on any meds   Hyperlipidemia Lipitor   Left hand numbness Likely related to positioning. Does not seem consistent with central insult.   OSA on CPAP Patient is nonadherent as an outpatient     DVT prophylaxis: SCDs Start: 09/18/21 0014 Code Status: Full  Family Communication:    Status is: Inpatient Remains inpatient appropriate because: Low BP, still on going eval for his bleed.    Nutritional status          Body mass index is 26.61 kg/m.         Subjective: Seen in hemodialysis, has low blood pressure which he tells me has been chronic for him.  He has been in Trendelenburg position.  This morning he is agreeable to PRBC transfusion after he discussed with Nephro   Examination:  General  exam: Appears calm and comfortable  Respiratory system: Clear to auscultation. Respiratory effort normal. Cardiovascular system: S1 & S2 heard, RRR. No JVD, murmurs, rubs, gallops or clicks. No pedal edema. Gastrointestinal system: Abdomen is nondistended, soft and nontender. No organomegaly or masses felt. Normal bowel sounds heard. Central nervous system: Alert and oriented. No focal neurological deficits. Extremities: Symmetric 5 x 5 power. Skin: No rashes, lesions or ulcers Psychiatry: Judgement and insight appear normal. Mood & affect appropriate.     Objective: Vitals:   09/20/21 0000 09/20/21 0416 09/20/21 0443 09/20/21 0749  BP: (!) 126/90 (!) 97/51  119/86  Pulse:  75  (!) 115  Resp:  18    Temp:  97.8 F (36.6 C)  97.8 F (36.6 C)  TempSrc:  Oral  Oral  SpO2:      Weight:   94 kg   Height:        Intake/Output Summary (Last 24 hours) at 09/20/2021 0801 Last data filed at 09/19/2021 1436 Gross per 24 hour  Intake 480 ml  Output --  Net 480 ml   Filed Weights   09/18/21 1401 09/18/21 1845 09/20/21 0443  Weight: 89.2 kg 87.2 kg 94 kg     Data Reviewed:   CBC: Recent Labs  Lab 09/17/21 1956 09/17/21 2227 09/18/21 0850 09/18/21 2258 09/19/21 0834 09/20/21 0457  WBC 9.9  --  12.1* 9.9 7.6 7.0  HGB 8.2* 7.2* 7.4* 7.4* 6.7* 6.0*  HCT 27.1* 23.7* 24.4* 22.6* 21.2* 18.2*  MCV 94.8  --  94.2 89.3 91.0  90.1  PLT 275  --  227 216 217 284   Basic Metabolic Panel: Recent Labs  Lab 09/17/21 1956 09/18/21 0636 09/19/21 1109 09/20/21 0457  NA 137 139 136 137  K 6.2* 6.4* 4.7 4.8  CL 105 109 97* 94*  CO2 19* 20* 27 27  GLUCOSE 97 78 109* 92  BUN 65* 75* 41* 55*  CREATININE 11.25* 11.90* 8.38* 10.12*  CALCIUM 8.7* 8.6* 8.6* 8.6*  PHOS  --   --  4.3  --    GFR: Estimated Creatinine Clearance: 8.8 mL/min (A) (by C-G formula based on SCr of 10.12 mg/dL (H)). Liver Function Tests: Recent Labs  Lab 09/17/21 1956 09/18/21 0636 09/19/21 1109  AST 17 17  --    ALT 13 11  --   ALKPHOS 71 54  --   BILITOT 0.6 0.6  --   PROT 6.7 6.2*  --   ALBUMIN 2.8* 2.6* 2.4*   No results for input(s): "LIPASE", "AMYLASE" in the last 168 hours. No results for input(s): "AMMONIA" in the last 168 hours. Coagulation Profile: Recent Labs  Lab 09/17/21 2227  INR 1.1   Cardiac Enzymes: No results for input(s): "CKTOTAL", "CKMB", "CKMBINDEX", "TROPONINI" in the last 168 hours. BNP (last 3 results) No results for input(s): "PROBNP" in the last 8760 hours. HbA1C: No results for input(s): "HGBA1C" in the last 72 hours. CBG: No results for input(s): "GLUCAP" in the last 168 hours. Lipid Profile: No results for input(s): "CHOL", "HDL", "LDLCALC", "TRIG", "CHOLHDL", "LDLDIRECT" in the last 72 hours. Thyroid Function Tests: No results for input(s): "TSH", "T4TOTAL", "FREET4", "T3FREE", "THYROIDAB" in the last 72 hours. Anemia Panel: Recent Labs    09/18/21 2255  FERRITIN 1,494*  TIBC 210*  IRON 23*   Sepsis Labs: No results for input(s): "PROCALCITON", "LATICACIDVEN" in the last 168 hours.  No results found for this or any previous visit (from the past 240 hour(s)).       Radiology Studies: No results found.      Scheduled Meds:  atorvastatin  10 mg Oral Once per day on Mon Thu   calcitRIOL  1.75 mcg Oral Q M,W,F-HD   Chlorhexidine Gluconate Cloth  6 each Topical Q0600   darbepoetin (ARANESP) injection - DIALYSIS  100 mcg Intravenous Q Mon-HD   pantoprazole  40 mg Oral BID   Continuous Infusions:  ferric gluconate (FERRLECIT) IVPB     sodium chloride Stopped (09/18/21 0737)     LOS: 3 days   Time spent= 35 mins    Dina Mobley Arsenio Loader, MD [facility]  If 7PM-7AM, please contact night-coverage  09/20/2021, 8:01 AM

## 2021-09-20 NOTE — Progress Notes (Signed)

## 2021-09-20 NOTE — Plan of Care (Signed)

## 2021-09-20 NOTE — Progress Notes (Addendum)
Daily Rounding Note  09/20/2021, 9:39 AM  LOS: 3 days   SUBJECTIVE:   Chief complaint: Recurrent GI bleeding.     No complaints.  Latest stool was small and less bloody, after dinner last night.   Taking solid food.  No complaints Currently in HD w hypotension, systolic in 62I but pt asymptomatic.  Pt says he often has hypotension during HD Getting another unit of PRBCs for Hgb of 6.   Also getting ferrlecit?   Attempt at CTA aborted due to inability to obtain adequate vascular access for the procedure.  OBJECTIVE:         Vital signs in last 24 hours:    Temp:  [97.8 F (36.6 C)-98.6 F (37 C)] 97.8 F (36.6 C) (08/09 0749) Pulse Rate:  [75-115] 115 (08/09 0749) Resp:  [18] 18 (08/09 0416) BP: (83-134)/(39-119) 119/86 (08/09 0749) SpO2:  [100 %] 100 % (08/08 2010) Weight:  [94 kg] 94 kg (08/09 0443) Last BM Date : 09/19/21 Filed Weights   09/18/21 1401 09/18/21 1845 09/20/21 0443  Weight: 89.2 kg 87.2 kg 94 kg   General: looks well.  Bed in trendelenburg setting.  Alert, comfortable.    Heart: RRR Chest: no labored breathing Abdomen: soft, NT, ND.  Active BS  Extremities: no CCE Neuro/Psych:  appropriate, fluid speech, calm, fully oriented.    Intake/Output from previous day: 08/08 0701 - 08/09 0700 In: 480 [P.O.:480] Out: -   Intake/Output this shift: No intake/output data recorded.  Lab Results: Recent Labs    09/18/21 2258 09/19/21 0834 09/20/21 0457  WBC 9.9 7.6 7.0  HGB 7.4* 6.7* 6.0*  HCT 22.6* 21.2* 18.2*  PLT 216 217 205   BMET Recent Labs    09/18/21 0636 09/19/21 1109 09/20/21 0457  NA 139 136 137  K 6.4* 4.7 4.8  CL 109 97* 94*  CO2 20* 27 27  GLUCOSE 78 109* 92  BUN 75* 41* 55*  CREATININE 11.90* 8.38* 10.12*  CALCIUM 8.6* 8.6* 8.6*   LFT Recent Labs    09/17/21 1956 09/18/21 0636 09/19/21 1109  PROT 6.7 6.2*  --   ALBUMIN 2.8* 2.6* 2.4*  AST 17 17  --   ALT 13 11   --   ALKPHOS 71 54  --   BILITOT 0.6 0.6  --    PT/INR Recent Labs    09/17/21 2227  LABPROT 14.3  INR 1.1   Hepatitis Panel No results for input(s): "HEPBSAG", "HCVAB", "HEPAIGM", "HEPBIGM" in the last 72 hours.  Studies/Results: No results found.  ASSESMENT:      Recurrent painless hematochezia.  EGD/colonoscopy last week with clean-based, nonbleeding gastric and duodenal ulcers, duodenitis, gastritis, segmental (likely ischemic) colitis, pandiverticulosis.  Rads unable to complete CTA due to inablility to gain vasc access.     ABL anemia and anemia of CKD. Pt ow accepting transfusion and renal ordered 2  Yesterday's iron studies with low iron, low TIBC, low iron sats, ferritin 1500.  Hgb to 6 this AM (down from 8.2 four d ago).  Weekly/Monday Aranesp.       ESRD.  HD MWF.       Hyperkalemia.  Await repeat K level today.    Poor periph IV access, candidate for CVC.  Lack of access forced cancellation of CTA.     PLAN     Continue supportive care.  Transfuse 2 PRBcs as ordered.  ?pursue CVC in order to obtain CTA?  Bleeding appears to be slowing but Hgb declining.       [name]  09/20/2021, 9:39 AM Phone [phone]

## 2021-09-20 NOTE — Progress Notes (Signed)
[facility] PA states to hold off on verbal order given moments ago to administer pt albumin 25 gms

## 2021-09-20 NOTE — Progress Notes (Signed)
GI PA at bedside. 

## 2021-09-20 NOTE — Procedures (Signed)
Central Venous Catheter Insertion Procedure Note  [name]  [id number]  January 20, 1961  Date:09/20/21  Time:8:15 PM   Provider Performing:Gaylen Venning B Erin Fulling   Procedure: Insertion of Non-tunneled Central Venous [id number]) with US guidance (16384)   Indication(s) Difficult access  Consent Risks of the procedure as well as the alternatives and risks of each were explained to the patient and/or caregiver.  Consent for the procedure was obtained and is signed in the bedside chart  Anesthesia Topical only with 1% lidocaine   Timeout Verified patient identification, verified procedure, site/side was marked, verified correct patient position, special equipment/implants available, medications/allergies/relevant history reviewed, required imaging and test results available.  Sterile Technique Maximal sterile technique including full sterile barrier drape, hand hygiene, sterile gown, sterile gloves, mask, hair covering, sterile ultrasound probe cover (if used).  Procedure Description Area of catheter insertion was cleaned with chlorhexidine and draped in sterile fashion.  With real-time ultrasound guidance a central venous catheter was placed into the left internal jugular vein. Nonpulsatile blood flow and easy flushing noted in all ports.  The catheter was sutured in place and sterile dressing applied.  Complications/Tolerance None; patient tolerated the procedure well. Chest X-ray is ordered to verify placement for internal jugular or subclavian cannulation.   Chest x-ray is not ordered for femoral cannulation.  EBL Minimal  Specimen(s) None

## 2021-09-21 ENCOUNTER — Encounter (HOSPITAL_COMMUNITY): Payer: Self-pay | Admitting: Internal Medicine

## 2021-09-21 ENCOUNTER — Inpatient Hospital Stay (HOSPITAL_COMMUNITY): Payer: Medicare Other

## 2021-09-21 DIAGNOSIS — K922 Gastrointestinal hemorrhage, unspecified: Secondary | ICD-10-CM | POA: Diagnosis not present

## 2021-09-21 LAB — TYPE AND SCREEN
ABO/RH(D): O POS
Antibody Screen: NEGATIVE
Unit division: 0
Unit division: 0

## 2021-09-21 LAB — BASIC METABOLIC PANEL
Anion gap: 10 (ref 5–15)
BUN: 32 mg/dL — ABNORMAL HIGH (ref 8–23)
CO2: 29 mmol/L (ref 22–32)
Calcium: 8.2 mg/dL — ABNORMAL LOW (ref 8.9–10.3)
Chloride: 96 mmol/L — ABNORMAL LOW (ref 98–111)
Creatinine, Ser: 6.99 mg/dL — ABNORMAL HIGH (ref 0.61–1.24)
GFR, Estimated: 8 mL/min — ABNORMAL LOW (ref >60)
Glucose, Bld: 97 mg/dL (ref 70–99)
Potassium: 4.2 mmol/L (ref 3.5–5.1)
Sodium: 135 mmol/L (ref 135–145)

## 2021-09-21 LAB — BPAM RBC
Blood Product Expiration Date: 202309042359
Blood Product Expiration Date: 202309042359
ISSUE DATE / TIME: 202308091047
ISSUE DATE / TIME: 202308091047
Unit Type and Rh: 5100
Unit Type and Rh: 5100

## 2021-09-21 LAB — CBC
HCT: 20.4 % — ABNORMAL LOW (ref 39.0–52.0)
Hemoglobin: 6.7 g/dL — CL (ref 13.0–17.0)
MCH: 29.6 pg (ref 26.0–34.0)
MCHC: 32.8 g/dL (ref 30.0–36.0)
MCV: 90.3 fL (ref 80.0–100.0)
Platelets: 186 10*3/uL (ref 150–400)
RBC: 2.26 MIL/uL — ABNORMAL LOW (ref 4.22–5.81)
RDW: 17 % — ABNORMAL HIGH (ref 11.5–15.5)
WBC: 8.1 10*3/uL (ref 4.0–10.5)
nRBC: 0 % (ref 0.0–0.2)

## 2021-09-21 MED ORDER — IOHEXOL 350 MG/ML SOLN
100.0000 mL | Freq: Once | INTRAVENOUS | Status: AC | PRN
  Administered 2021-09-21: 100 mL via INTRAVENOUS

## 2021-09-21 MED ORDER — DARBEPOETIN ALFA 200 MCG/0.4ML IJ SOSY
200.0000 ug | PREFILLED_SYRINGE | INTRAMUSCULAR | Status: DC
Start: 2021-09-25 — End: 2021-09-25

## 2021-09-21 MED ORDER — SEVELAMER CARBONATE 800 MG PO TABS
800.0000 mg | ORAL_TABLET | Freq: Three times a day (TID) | ORAL | Status: DC
  Administered 2021-09-21 (×2): 800 mg via ORAL
  Filled 2021-09-21 (×2): qty 1

## 2021-09-21 MED ORDER — SODIUM CHLORIDE 0.9% FLUSH
10.0000 mL | INTRAVENOUS | Status: DC | PRN
  Administered 2021-09-22: 10 mL

## 2021-09-21 NOTE — Progress Notes (Signed)
TOC following for high risk readmission rate. Patient readmitted for GI bleed after discharged on 09/13/21.  Had CVC placed for access for dialysis. Awaiting results of CTA study. TOC will continue to follow for needs.

## 2021-09-21 NOTE — Progress Notes (Signed)
Daily Rounding Note  09/21/2021, 8:00 PM  LOS: 4 days   SUBJECTIVE:   Patient states that his bleeding appears to be slowing down. He had a BM today that looked like old blood.  OBJECTIVE:         Vital signs in last 24 hours:    Temp:  [97.8 F (36.6 C)-98.6 F (37 C)] 97.8 F (36.6 C) (08/10 1950) Pulse Rate:  [88-100] 88 (08/10 1950) Resp:  [16-18] 18 (08/10 1950) BP: (92-141)/(43-83) 141/83 (08/10 1950) SpO2:  [95 %-98 %] 95 % (08/10 1950) Weight:  [91.4 kg] 91.4 kg (08/10 0500) Last BM Date : 09/20/21 (per pt 3 bloody stools 1 after coming back from HD. Stated I think the 2 units of bld they gave me I pooped it all out) Filed Weights   09/20/21 0912 09/20/21 1356 09/21/21 0500  Weight: 90.1 kg 90.9 kg 91.4 kg   General: Alert, comfortable.    Heart: regular rate Chest: no labored breathing Abdomen: soft, ND. Neuro/Psych:  appropriate, fluid speech, calm, fully oriented.    Intake/Output from previous day: 08/09 0701 - 08/10 0700 In: 630 [Blood:630] Out: -0.6   Intake/Output this shift: No intake/output data recorded.  Lab Results: Recent Labs    09/19/21 0834 09/20/21 0457 09/21/21 0341  WBC 7.6 7.0 8.1  HGB 6.7* 6.0* 6.7*  HCT 21.2* 18.2* 20.4*  PLT 217 205 186   BMET Recent Labs    09/19/21 1109 09/20/21 0457 09/21/21 0341  NA 136 137 135  K 4.7 4.8 4.2  CL 97* 94* 96*  CO2 '27 27 29  '$ GLUCOSE 109* 92 97  BUN 41* 55* 32*  CREATININE 8.38* 10.12* 6.99*  CALCIUM 8.6* 8.6* 8.2*   LFT Recent Labs    09/19/21 1109  ALBUMIN 2.4*   PT/INR No results for input(s): "LABPROT", "INR" in the last 72 hours.  Hepatitis Panel No results for input(s): "HEPBSAG", "HCVAB", "HEPAIGM", "HEPBIGM" in the last 72 hours.  Studies/Results: CT ANGIO GI BLEED  Result Date: 09/21/2021 CLINICAL DATA:  Lower gastrointestinal bleeding. EXAM: CTA ABDOMEN AND PELVIS WITHOUT AND WITH CONTRAST TECHNIQUE:  Multidetector CT imaging of the abdomen and pelvis was performed using the standard protocol during bolus administration of intravenous contrast. Multiplanar reconstructed images and MIPs were obtained and reviewed to evaluate the vascular anatomy. RADIATION DOSE REDUCTION: This exam was performed according to the departmental dose-optimization program which includes automated exposure control, adjustment of the mA and/or kV according to patient size and/or use of iterative reconstruction technique. CONTRAST:  133m OMNIPAQUE IOHEXOL 350 MG/ML SOLN COMPARISON:  September 13, 2021. FINDINGS: VASCULAR Aorta: Atherosclerosis of abdominal aorta is noted without aneurysm or dissection. Celiac: Patent without evidence of aneurysm, dissection, vasculitis or significant stenosis. SMA: Patent without evidence of aneurysm, dissection, vasculitis or significant stenosis. 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 IMA: Patent without evidence of aneurysm, dissection, vasculitis or significant stenosis. Inflow: Patent without evidence of aneurysm, dissection, vasculitis or significant stenosis. Proximal Outflow: Bilateral common femoral and visualized portions of the superficial and profunda femoral arteries are patent without evidence of aneurysm, dissection, vasculitis or significant stenosis. Veins: No obvious venous abnormality within the limitations of this arterial phase study. Review of the MIP images confirms the above findings. NON-VASCULAR Lower chest: No acute abnormality. Hepatobiliary: No focal liver abnormality is seen. No gallstones, gallbladder wall thickening, or biliary dilatation. Pancreas: Unremarkable. No pancreatic ductal dilatation  or surrounding inflammatory changes. Spleen: Normal in size without focal abnormality. Adrenals/Urinary Tract: Adrenal glands appear normal. Status post bilateral nephrectomy. Urinary bladder is not well visualized  due to scatter artifact arising from left hip prosthesis. Stomach/Bowel: Stomach is unremarkable. Patient is status post right hemicolectomy. There is no evidence of bowel obstruction or inflammation. No definite gastrointestinal bleeding is noted. Lymphatic: No adenopathy is noted. Reproductive: Prostate is unremarkable. Other: No abdominal wall hernia or abnormality. No abdominopelvic ascites. Musculoskeletal: Findings consistent with renal osteodystrophy. No acute osseous abnormality is noted. Status post left total hip arthroplasty. IMPRESSION: VASCULAR Atherosclerosis of abdominal aorta is noted without aneurysm or dissection. No definite evidence of contrast extravasation to suggest active gastrointestinal bleeding. NON-VASCULAR Status post bilateral nephrectomy. Status post right hemicolectomy. No definite acute abnormality seen in the abdomen or pelvis. Electronically Signed   By: Marijo Conception M.D.   On: 09/21/2021 09:26   DG CHEST PORT 1 VIEW  Result Date: 09/20/2021 CLINICAL DATA:  Central line placement EXAM: PORTABLE CHEST 1 VIEW COMPARISON:  06/18/2020 FINDINGS: Left central line in place with the tip in the right atrium. No pneumothorax. Heart and mediastinal contours are within normal limits. No focal opacities or effusions. No acute bony abnormality. Aortic atherosclerosis. IMPRESSION: No active cardiopulmonary disease. Electronically Signed   By: Rolm Baptise M.D.   On: 09/20/2021 20:37   [place] EKG SITE RITE  Result Date: 09/20/2021 If Site Rite image not attached, placement could not be confirmed due to current cardiac rhythm.   ASSESMENT:      Recurrent painless hematochezia.  EGD/colonoscopy last week with clean-based, nonbleeding gastric and duodenal ulcers, duodenitis, gastritis, segmental ischemic colitis, pandiverticulosis.  CTA GI did not show a source of GI bleeding  ABL anemia and anemia of CKD. Patient has accepted pRBCs during this hospitalization. Weekly/Monday Aranesp.        ESRD.  HD MWF.       Hyperkalemia.     Poor periph IV access. Had to get a central line placed to get his CTA.   PLAN   Since patient has had improvement in his clinical signs of bleeding, will plan to monitor his Hb for tomorrow. If patient demonstrates continued signs of GI bleeding, may need SBE/colonoscopy +/- VCE for further evaluation.      Sharyn Creamer  09/21/2021, 8:00 PM Phone [phone]

## 2021-09-21 NOTE — Plan of Care (Signed)

## 2021-09-21 NOTE — Progress Notes (Signed)
PROGRESS NOTE    [name] Sr.  SHF:[id number] DOB: 1960/07/11 DOA: 09/17/2021 PCP: [name], MD   Brief Narrative:  61 y.o. male with a history of ESRD on HD, CVA, hypertension, diverticulosis, hemorrhoids, ischemic colitis, OSA not on CPAP, GERD, PUD, migraines. Patient presented secondary to recurrent bloody stools. GI and Nephro were consulted. PPI BID was started.  Recent EGD/C-scope that showed clean based nonbleeding gastric and duodenal ulcer with segmental colitis, duodenitis, gastritis and pandiverticulosis. Agreed for 2U PRBC.  But despite of that having bleeding, repeat CTA is negative.   Assessment & Plan:  Active Problems:   Anemia   GI bleed   Recurrent GI bleed -Recent EGD/C-scope that showed clean based nonbleeding gastric and duodenal ulcer with segmental colitis, duodenitis, gastritis and pandiverticulosis.  Status post 2 unit PRBC transfusion, hemoglobin still drifted down.  CTA on 8/10 does not show active bleed.  Discussed with GI who will see the patient, will likely require endoscopy/colonoscopy plus video capsule.   Hypotension Chronic hypotension, midodrine if necessary   Acute on chronic anemia Acute blood loss anemia Jehovah's Witness, initially did not accept blood products but then agreed to it for lifesaving measures.  Received EPO during HD, IV iron.  ESRD on HD Nephrology following.   Hyperkalemia S/p HD 8/9   Primary hypertension Not on any meds   Hyperlipidemia Lipitor   Left hand numbness Likely related to positioning. Does not seem consistent with central insult.   OSA on CPAP Patient is nonadherent as an outpatient     DVT prophylaxis: SCDs Start: 09/18/21 0014 Code Status: Full  Family Communication:    Status is: Inpatient Remains inpatient appropriate because: Low BP, still on going eval for his bleed.      Subjective: Had Blood yBM yesterday and while attempting CTA, his midline blew. Therefore central line  placed yesterday evening. Had 2 episodes of BRBPR in last 24 hrs. No other complaints during my visit this morning.   Examination:  Constitutional: Not in acute distress Respiratory: Clear to auscultation bilaterally Cardiovascular: Normal sinus rhythm, no rubs Abdomen: Nontender nondistended good bowel sounds Musculoskeletal: No edema noted Skin: No rashes seen Neurologic: CN 2-12 grossly intact.  And nonfocal Psychiatric: Normal judgment and insight. Alert and oriented x 3. Normal mood.     Objective: Vitals:   09/20/21 2008 09/20/21 2023 09/21/21 0403 09/21/21 0500  BP: (!) 92/50 (!) 99/51 (!) 115/43   Pulse:  100 91   Resp:  16 18   Temp:  98.6 F (37 C)    TempSrc:  Oral    SpO2:  98% 97%   Weight:    91.4 kg  Height:        Intake/Output Summary (Last 24 hours) at 09/21/2021 1325 Last data filed at 09/20/2021 1344 Gross per 24 hour  Intake --  Output -0.6 ml  Net 0.6 ml   Filed Weights   09/20/21 0912 09/20/21 1356 09/21/21 0500  Weight: 90.1 kg 90.9 kg 91.4 kg     Data Reviewed:   CBC: Recent Labs  Lab 09/18/21 0850 09/18/21 2258 09/19/21 0834 09/20/21 0457 09/21/21 0341  WBC 12.1* 9.9 7.6 7.0 8.1  HGB 7.4* 7.4* 6.7* 6.0* 6.7*  HCT 24.4* 22.6* 21.2* 18.2* 20.4*  MCV 94.2 89.3 91.0 90.1 90.3  PLT 227 216 217 205 502   Basic Metabolic Panel: Recent Labs  Lab 09/17/21 1956 09/18/21 0636 09/19/21 1109 09/20/21 0457 09/21/21 0341  NA 137 139 136 137 135  K 6.2* 6.4* 4.7 4.8 4.2  CL 105 109 97* 94* 96*  CO2 19* 20* '27 27 29  '$ GLUCOSE 97 78 109* 92 97  BUN 65* 75* 41* 55* 32*  CREATININE 11.25* 11.90* 8.38* 10.12* 6.99*  CALCIUM 8.7* 8.6* 8.6* 8.6* 8.2*  PHOS  --   --  4.3  --   --    GFR: Estimated Creatinine Clearance: 12.7 mL/min (A) (by C-G formula based on SCr of 6.99 mg/dL (H)). Liver Function Tests: Recent Labs  Lab 09/17/21 1956 09/18/21 0636 09/19/21 1109  AST 17 17  --   ALT 13 11  --   ALKPHOS 71 54  --   BILITOT 0.6 0.6   --   PROT 6.7 6.2*  --   ALBUMIN 2.8* 2.6* 2.4*   No results for input(s): "LIPASE", "AMYLASE" in the last 168 hours. No results for input(s): "AMMONIA" in the last 168 hours. Coagulation Profile: Recent Labs  Lab 09/17/21 2227  INR 1.1   Cardiac Enzymes: No results for input(s): "CKTOTAL", "CKMB", "CKMBINDEX", "TROPONINI" in the last 168 hours. BNP (last 3 results) No results for input(s): "PROBNP" in the last 8760 hours. HbA1C: No results for input(s): "HGBA1C" in the last 72 hours. CBG: No results for input(s): "GLUCAP" in the last 168 hours. Lipid Profile: No results for input(s): "CHOL", "HDL", "LDLCALC", "TRIG", "CHOLHDL", "LDLDIRECT" in the last 72 hours. Thyroid Function Tests: No results for input(s): "TSH", "T4TOTAL", "FREET4", "T3FREE", "THYROIDAB" in the last 72 hours. Anemia Panel: Recent Labs    09/18/21 2255  FERRITIN 1,494*  TIBC 210*  IRON 23*   Sepsis Labs: No results for input(s): "PROCALCITON", "LATICACIDVEN" in the last 168 hours.  No results found for this or any previous visit (from the past 240 hour(s)).       Radiology Studies: CT ANGIO GI BLEED  Result Date: 09/21/2021 CLINICAL DATA:  Lower gastrointestinal bleeding. EXAM: CTA ABDOMEN AND PELVIS WITHOUT AND WITH CONTRAST TECHNIQUE: Multidetector CT imaging of the abdomen and pelvis was performed using the standard protocol during bolus administration of intravenous contrast. Multiplanar reconstructed images and MIPs were obtained and reviewed to evaluate the vascular anatomy. RADIATION DOSE REDUCTION: This exam was performed according to the departmental dose-optimization program which includes automated exposure control, adjustment of the mA and/or kV according to patient size and/or use of iterative reconstruction technique. CONTRAST:  129m OMNIPAQUE IOHEXOL 350 MG/ML SOLN COMPARISON:  September 13, 2021. FINDINGS: VASCULAR Aorta: Atherosclerosis of abdominal aorta is noted without aneurysm or  dissection. Celiac: Patent without evidence of aneurysm, dissection, vasculitis or significant stenosis. SMA: Patent without evidence of aneurysm, dissection, vasculitis or significant stenosis. 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 IMA: Patent without evidence of aneurysm, dissection, vasculitis or significant stenosis. Inflow: Patent without evidence of aneurysm, dissection, vasculitis or significant stenosis. Proximal Outflow: Bilateral common femoral and visualized portions of the superficial and profunda femoral arteries are patent without evidence of aneurysm, dissection, vasculitis or significant stenosis. Veins: No obvious venous abnormality within the limitations of this arterial phase study. Review of the MIP images confirms the above findings. NON-VASCULAR Lower chest: No acute abnormality. Hepatobiliary: No focal liver abnormality is seen. No gallstones, gallbladder wall thickening, or biliary dilatation. Pancreas: Unremarkable. No pancreatic ductal dilatation or surrounding inflammatory changes. Spleen: Normal in size without focal abnormality. Adrenals/Urinary Tract: Adrenal glands appear normal. Status post bilateral nephrectomy. Urinary bladder is not well visualized due to scatter artifact arising from left hip  prosthesis. Stomach/Bowel: Stomach is unremarkable. Patient is status post right hemicolectomy. There is no evidence of bowel obstruction or inflammation. No definite gastrointestinal bleeding is noted. Lymphatic: No adenopathy is noted. Reproductive: Prostate is unremarkable. Other: No abdominal wall hernia or abnormality. No abdominopelvic ascites. Musculoskeletal: Findings consistent with renal osteodystrophy. No acute osseous abnormality is noted. Status post left total hip arthroplasty. IMPRESSION: VASCULAR Atherosclerosis of abdominal aorta is noted without aneurysm or dissection. No definite evidence of contrast  extravasation to suggest active gastrointestinal bleeding. NON-VASCULAR Status post bilateral nephrectomy. Status post right hemicolectomy. No definite acute abnormality seen in the abdomen or pelvis. Electronically Signed   By: Marijo Conception M.D.   On: 09/21/2021 09:26   DG CHEST PORT 1 VIEW  Result Date: 09/20/2021 CLINICAL DATA:  Central line placement EXAM: PORTABLE CHEST 1 VIEW COMPARISON:  06/18/2020 FINDINGS: Left central line in place with the tip in the right atrium. No pneumothorax. Heart and mediastinal contours are within normal limits. No focal opacities or effusions. No acute bony abnormality. Aortic atherosclerosis. IMPRESSION: No active cardiopulmonary disease. Electronically Signed   By: Rolm Baptise M.D.   On: 09/20/2021 20:37   [place] EKG SITE RITE  Result Date: 09/20/2021 If Site Rite image not attached, placement could not be confirmed due to current cardiac rhythm.       Scheduled Meds:  sodium chloride   Intravenous Once   atorvastatin  10 mg Oral Once per day on Mon Thu   calcitRIOL  1.75 mcg Oral Q M,W,F-HD   Chlorhexidine Gluconate Cloth  6 each Topical Q0600   [START ON 09/25/2021] darbepoetin (ARANESP) injection - DIALYSIS  200 mcg Intravenous Q Mon-HD   pantoprazole  40 mg Oral BID   sevelamer carbonate  800 mg Oral TID WC   sodium chloride flush  10-40 mL Intracatheter Q12H   Continuous Infusions:  sodium chloride Stopped (09/18/21 0737)     LOS: 4 days   Time spent= 35 mins    Chareese Sergent Arsenio Loader, MD [facility]  If 7PM-7AM, please contact night-coverage  09/21/2021, 1:25 PM

## 2021-09-21 NOTE — Progress Notes (Addendum)
[facility] Progress Note   Subjective:  Continued GIB s/p transfusion yesterday with HD. Denies chest pain, dyspnea, lightheadedness. Had CVC placed for access. Awaiting results of CTA study.  Objective Vitals:   09/20/21 2008 09/20/21 2023 09/21/21 0403 09/21/21 0500  BP: (!) 92/50 (!) 99/51 (!) 115/43   Pulse:  100 91   Resp:  16 18   Temp:  98.6 F (37 C)    TempSrc:  Oral    SpO2:  98% 97%   Weight:    91.4 kg  Height:         Additional Objective Labs: Basic Metabolic Panel: Recent Labs  Lab 09/19/21 1109 09/20/21 0457 09/21/21 0341  NA 136 137 135  K 4.7 4.8 4.2  CL 97* 94* 96*  CO2 '27 27 29  '$ GLUCOSE 109* 92 97  BUN 41* 55* 32*  CREATININE 8.38* 10.12* 6.99*  CALCIUM 8.6* 8.6* 8.2*  PHOS 4.3  --   --     CBC: Recent Labs  Lab 09/18/21 0850 09/18/21 2258 09/19/21 0834 09/20/21 0457 09/21/21 0341  WBC 12.1* 9.9 7.6 7.0 8.1  HGB 7.4* 7.4* 6.7* 6.0* 6.7*  HCT 24.4* 22.6* 21.2* 18.2* 20.4*  MCV 94.2 89.3 91.0 90.1 90.3  PLT 227 216 217 205 186    Blood Culture    Component Value Date/Time   SDES BLOOD LEFT HAND 08/01/2015 0624   SPECREQUEST BOTTLES DRAWN AEROBIC AND ANAEROBIC  5CC 08/01/2015 0624   CULT NO GROWTH 5 DAYS 08/01/2015 0624   REPTSTATUS 08/06/2015 FINAL 08/01/2015 6195     Physical Exam General: Alert, nad  Heart: RRR  Lungs: Clear bilaterally  Abdomen: soft non- tender Extremities: No sig LE edema  Dialysis Access: R AVF   Medications:  sodium chloride Stopped (09/18/21 0737)    sodium chloride   Intravenous Once   atorvastatin  10 mg Oral Once per day on Mon Thu   calcitRIOL  1.75 mcg Oral Q M,W,F-HD   Chlorhexidine Gluconate Cloth  6 each Topical Q0600   darbepoetin (ARANESP) injection - DIALYSIS  100 mcg Intravenous Q Mon-HD   pantoprazole  40 mg Oral BID   sodium chloride flush  10-40 mL Intracatheter Q12H    Home meds include - atorvastatin, cinacalcet 120 hs, gabapentin 100 hs, lokelma prn,  pantoprazole, sevelamer carbonate 5 ac tid, sucralfate 1g bid, prns/ vits/ supps      OP HD: MWF [facility]  3h 62mn    400/700   87.5kg  2K/3Ca bath  Hep 3500  AVF RFA - last HD 8/4, post wt 87kg, idwg's low recently - last Hb 9.8 on 8/4, last tsat 32% and ferritin 1435 in July - last mircera 75 ug on 7/26, q2, due next week - calcitriol 1.75 ug tiw po   Assessment/ Plan: GI bleed - recurrent, here last week w/ EGF showing nonbleeding GU/ DU's. Hb down, FOB+. Per pmd/ GI.  (+Jehovah Witness). Did accept blood on 8/9 - transfused 2 u prbcs. Poor response Hgb only 6.7 this am. CTA GI pending  ESRD - on HD MWF. Next HD 8/11.  Hyperkalemia - Corrected with HD.  Anemia esrd - Hb dropping w/ active GIB. S/p 2U prbcs 8/9. Received ESA early (darbe 100 ug  on 8/7). Increase next ESA dose.  Tsat 11% Ferritin 1494. Iron dextran allergy in chart but has received IV Fe as outpatient.  MBD ckd - CCa and phos in range, cont sevelamer as binder, cont po vdra w/ HD.  BP/ vol - BP soft -better after transfusion. Not BP lowering meds. Minimal UF with HD.  H/o CVA    Mckale Haffey Larina Earthly PA-C [facility] 09/21/2021,9:42 AM

## 2021-09-22 DIAGNOSIS — K922 Gastrointestinal hemorrhage, unspecified: Secondary | ICD-10-CM | POA: Diagnosis not present

## 2021-09-22 DIAGNOSIS — K254 Chronic or unspecified gastric ulcer with hemorrhage: Principal | ICD-10-CM

## 2021-09-22 DIAGNOSIS — K259 Gastric ulcer, unspecified as acute or chronic, without hemorrhage or perforation: Secondary | ICD-10-CM

## 2021-09-22 DIAGNOSIS — K625 Hemorrhage of anus and rectum: Principal | ICD-10-CM

## 2021-09-22 DIAGNOSIS — D62 Acute posthemorrhagic anemia: Secondary | ICD-10-CM

## 2021-09-22 DIAGNOSIS — K5730 Diverticulosis of large intestine without perforation or abscess without bleeding: Secondary | ICD-10-CM

## 2021-09-22 DIAGNOSIS — K269 Duodenal ulcer, unspecified as acute or chronic, without hemorrhage or perforation: Secondary | ICD-10-CM

## 2021-09-22 LAB — CBC
HCT: 19 % — ABNORMAL LOW (ref 39.0–52.0)
HCT: 18 % — ABNORMAL LOW (ref 39.0–52.0)
Hemoglobin: 6.2 g/dL — CL (ref 13.0–17.0)
Hemoglobin: 6 g/dL — CL (ref 13.0–17.0)
MCH: 29.4 pg (ref 26.0–34.0)
MCH: 29.7 pg (ref 26.0–34.0)
MCHC: 32.6 g/dL (ref 30.0–36.0)
MCHC: 33.3 g/dL (ref 30.0–36.0)
MCV: 90 fL (ref 80.0–100.0)
MCV: 89.1 fL (ref 80.0–100.0)
Platelets: 225 10*3/uL (ref 150–400)
Platelets: 203 10*3/uL (ref 150–400)
RBC: 2.11 MIL/uL — ABNORMAL LOW (ref 4.22–5.81)
RBC: 2.02 MIL/uL — ABNORMAL LOW (ref 4.22–5.81)
RDW: 17.2 % — ABNORMAL HIGH (ref 11.5–15.5)
RDW: 17.2 % — ABNORMAL HIGH (ref 11.5–15.5)
WBC: 7.5 10*3/uL (ref 4.0–10.5)
WBC: 7.5 10*3/uL (ref 4.0–10.5)
nRBC: 0 % (ref 0.0–0.2)
nRBC: 0 % (ref 0.0–0.2)

## 2021-09-22 LAB — BASIC METABOLIC PANEL
Anion gap: 11 (ref 5–15)
BUN: 51 mg/dL — ABNORMAL HIGH (ref 8–23)
CO2: 27 mmol/L (ref 22–32)
Calcium: 8.2 mg/dL — ABNORMAL LOW (ref 8.9–10.3)
Chloride: 95 mmol/L — ABNORMAL LOW (ref 98–111)
Creatinine, Ser: 9.57 mg/dL — ABNORMAL HIGH (ref 0.61–1.24)
GFR, Estimated: 6 mL/min — ABNORMAL LOW (ref >60)
Glucose, Bld: 87 mg/dL (ref 70–99)
Potassium: 4.8 mmol/L (ref 3.5–5.1)
Sodium: 133 mmol/L — ABNORMAL LOW (ref 135–145)

## 2021-09-22 MED ORDER — PENTAFLUOROPROP-TETRAFLUOROETH EX AERO: 1.0000 | INHALATION_SPRAY | CUTANEOUS | Status: DC | PRN

## 2021-09-22 MED ORDER — SEVELAMER CARBONATE 800 MG PO TABS: 3200.0000 mg | ORAL_TABLET | Freq: Three times a day (TID) | ORAL | Status: DC

## 2021-09-22 MED ORDER — SEVELAMER CARBONATE 800 MG PO TABS
3200.0000 mg | ORAL_TABLET | Freq: Three times a day (TID) | ORAL | Status: DC
Start: 2021-09-22 — End: 2021-09-29
  Administered 2021-09-22 – 2021-09-29 (×18): 3200 mg via ORAL
  Filled 2021-09-22 (×18): qty 4

## 2021-09-22 MED ORDER — PEG-KCL-NACL-NASULF-NA ASC-C 100 G PO SOLR
1.0000 | Freq: Once | ORAL | Status: AC
  Administered 2021-09-22: 200 g via ORAL
  Filled 2021-09-22: qty 1

## 2021-09-22 MED ORDER — LIDOCAINE-PRILOCAINE 2.5-2.5 % EX CREA
1.0000 | TOPICAL_CREAM | CUTANEOUS | Status: DC | PRN
Start: 2021-09-22 — End: 2021-09-22

## 2021-09-22 MED ORDER — LIDOCAINE HCL (PF) 1 % IJ SOLN: 5.0000 mL | INTRAMUSCULAR | Status: DC | PRN

## 2021-09-22 MED ORDER — METOCLOPRAMIDE HCL 5 MG/ML IJ SOLN
10.0000 mg | Freq: Once | INTRAMUSCULAR | Status: AC
  Administered 2021-09-22: 10 mg via INTRAVENOUS
  Filled 2021-09-22: qty 2

## 2021-09-22 NOTE — Progress Notes (Signed)
PROGRESS NOTE    Carl Tober Sr.  VOZ:[id number] DOB: February 07, 1961 DOA: 09/17/2021 PCP: [name], MD   Brief Narrative:  61 y.o. male with a history of ESRD on HD, CVA, hypertension, diverticulosis, hemorrhoids, ischemic colitis, OSA not on CPAP, GERD, PUD, migraines. Patient presented secondary to recurrent bloody stools. GI and Nephro were consulted. PPI BID was started.  Recent EGD/C-scope that showed clean based nonbleeding gastric and duodenal ulcer with segmental colitis, duodenitis, gastritis and pandiverticulosis. Agreed for 2U PRBC.  But despite of that having bleeding, repeat CTA is negative.  Hemoglobin continues to drift down but refusing PRBC now.  GI planning on endoscopic evaluation.   Assessment & Plan:  Active Problems:   Anemia   GI bleed   Recurrent GI bleed -Recent EGD/C-scope that showed clean based nonbleeding gastric and duodenal ulcer with segmental colitis, duodenitis, gastritis and pandiverticulosis.  Status post 2 unit PRBC transfusion, hemoglobin still drifted down.  CTA on 8/10 does not show active bleed.  Discussed with GI, planning on EGD/Cscope. Will benefit from [place] as well.    Hypotension Chronic hypotension, midodrine if necessary   Acute on chronic anemia Acute blood loss anemia Jehovah's Witness, initially did not accept blood products but then agreed to it for lifesaving measures therefore received 2 unit PRBC.  Today refusing further transfusion.  ESRD on HD Nephrology following.   Hyperkalemia HD per nephrology   Primary hypertension Not on any meds   Hyperlipidemia Lipitor   Left hand numbness Resolved   OSA on CPAP Patient is nonadherent as an outpatient     DVT prophylaxis: SCDs Start: 09/18/21 0014 Code Status: Full  Family Communication:    Status is: Inpatient Remains inpatient appropriate because: Low BP during HD, ongoing evaluation for GI bleed   Subjective: Had an episode of large bloody BM after  HD today.  Feels ok thereafter, overall weak.  Doesn't want blood transfusion today.   Examination: Constitutional: Not in acute distress Respiratory: Clear to auscultation bilaterally Cardiovascular: Normal sinus rhythm, no rubs Abdomen: Nontender nondistended good bowel sounds Musculoskeletal: No edema noted Skin: No rashes seen Neurologic: CN 2-12 grossly intact.  And nonfocal Psychiatric: Normal judgment and insight. Alert and oriented x 3. Normal mood.      Objective: Vitals:   09/22/21 0730 09/22/21 0800 09/22/21 0830 09/22/21 0900  BP: 109/64 109/81 120/65 117/74  Pulse: 77 77 85 97  Resp: '12 11 15 19  '$ Temp:      TempSrc:      SpO2: 100% 99% 100% 99%  Weight:      Height:       No intake or output data in the 24 hours ending 09/22/21 0912  Filed Weights   09/21/21 0500 09/22/21 0437 09/22/21 0701  Weight: 91.4 kg 97.5 kg 93.6 kg     Data Reviewed:   CBC: Recent Labs  Lab 09/18/21 2258 09/19/21 0834 09/20/21 0457 09/21/21 0341 09/22/21 0450  WBC 9.9 7.6 7.0 8.1 7.5  HGB 7.4* 6.7* 6.0* 6.7* 6.2*  HCT 22.6* 21.2* 18.2* 20.4* 19.0*  MCV 89.3 91.0 90.1 90.3 90.0  PLT 216 217 205 186 425   Basic Metabolic Panel: Recent Labs  Lab 09/18/21 0636 09/19/21 1109 09/20/21 0457 09/21/21 0341 09/22/21 0450  NA 139 136 137 135 133*  K 6.4* 4.7 4.8 4.2 4.8  CL 109 97* 94* 96* 95*  CO2 20* '27 27 29 27  '$ GLUCOSE 78 109* 92 97 87  BUN 75* 41* 55* 32*  51*  CREATININE 11.90* 8.38* 10.12* 6.99* 9.57*  CALCIUM 8.6* 8.6* 8.6* 8.2* 8.2*  PHOS  --  4.3  --   --   --    GFR: Estimated Creatinine Clearance: 9.3 mL/min (A) (by C-G formula based on SCr of 9.57 mg/dL (H)). Liver Function Tests: Recent Labs  Lab 09/17/21 1956 09/18/21 0636 09/19/21 1109  AST 17 17  --   ALT 13 11  --   ALKPHOS 71 54  --   BILITOT 0.6 0.6  --   PROT 6.7 6.2*  --   ALBUMIN 2.8* 2.6* 2.4*   No results for input(s): "LIPASE", "AMYLASE" in the last 168 hours. No results for  input(s): "AMMONIA" in the last 168 hours. Coagulation Profile: Recent Labs  Lab 09/17/21 2227  INR 1.1   Cardiac Enzymes: No results for input(s): "CKTOTAL", "CKMB", "CKMBINDEX", "TROPONINI" in the last 168 hours. BNP (last 3 results) No results for input(s): "PROBNP" in the last 8760 hours. HbA1C: No results for input(s): "HGBA1C" in the last 72 hours. CBG: No results for input(s): "GLUCAP" in the last 168 hours. Lipid Profile: No results for input(s): "CHOL", "HDL", "LDLCALC", "TRIG", "CHOLHDL", "LDLDIRECT" in the last 72 hours. Thyroid Function Tests: No results for input(s): "TSH", "T4TOTAL", "FREET4", "T3FREE", "THYROIDAB" in the last 72 hours. Anemia Panel: No results for input(s): "VITAMINB12", "FOLATE", "FERRITIN", "TIBC", "IRON", "RETICCTPCT" in the last 72 hours.  Sepsis Labs: No results for input(s): "PROCALCITON", "LATICACIDVEN" in the last 168 hours.  No results found for this or any previous visit (from the past 240 hour(s)).       Radiology Studies: CT ANGIO GI BLEED  Result Date: 09/21/2021 CLINICAL DATA:  Lower gastrointestinal bleeding. EXAM: CTA ABDOMEN AND PELVIS WITHOUT AND WITH CONTRAST TECHNIQUE: Multidetector CT imaging of the abdomen and pelvis was performed using the standard protocol during bolus administration of intravenous contrast. Multiplanar reconstructed images and MIPs were obtained and reviewed to evaluate the vascular anatomy. RADIATION DOSE REDUCTION: This exam was performed according to the departmental dose-optimization program which includes automated exposure control, adjustment of the mA and/or kV according to patient size and/or use of iterative reconstruction technique. CONTRAST:  183m OMNIPAQUE IOHEXOL 350 MG/ML SOLN COMPARISON:  September 13, 2021. FINDINGS: VASCULAR Aorta: Atherosclerosis of abdominal aorta is noted without aneurysm or dissection. Celiac: Patent without evidence of aneurysm, dissection, vasculitis or significant  stenosis. SMA: Patent without evidence of aneurysm, dissection, vasculitis or significant stenosis. 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 IMA: Patent without evidence of aneurysm, dissection, vasculitis or significant stenosis. Inflow: Patent without evidence of aneurysm, dissection, vasculitis or significant stenosis. Proximal Outflow: Bilateral common femoral and visualized portions of the superficial and profunda femoral arteries are patent without evidence of aneurysm, dissection, vasculitis or significant stenosis. Veins: No obvious venous abnormality within the limitations of this arterial phase study. Review of the MIP images confirms the above findings. NON-VASCULAR Lower chest: No acute abnormality. Hepatobiliary: No focal liver abnormality is seen. No gallstones, gallbladder wall thickening, or biliary dilatation. Pancreas: Unremarkable. No pancreatic ductal dilatation or surrounding inflammatory changes. Spleen: Normal in size without focal abnormality. Adrenals/Urinary Tract: Adrenal glands appear normal. Status post bilateral nephrectomy. Urinary bladder is not well visualized due to scatter artifact arising from left hip prosthesis. Stomach/Bowel: Stomach is unremarkable. Patient is status post right hemicolectomy. There is no evidence of bowel obstruction or inflammation. No definite gastrointestinal bleeding is noted. Lymphatic: No adenopathy is noted. Reproductive: Prostate is unremarkable.  Other: No abdominal wall hernia or abnormality. No abdominopelvic ascites. Musculoskeletal: Findings consistent with renal osteodystrophy. No acute osseous abnormality is noted. Status post left total hip arthroplasty. IMPRESSION: VASCULAR Atherosclerosis of abdominal aorta is noted without aneurysm or dissection. No definite evidence of contrast extravasation to suggest active gastrointestinal bleeding. NON-VASCULAR Status post bilateral  nephrectomy. Status post right hemicolectomy. No definite acute abnormality seen in the abdomen or pelvis. Electronically Signed   By: Marijo Conception M.D.   On: 09/21/2021 09:26   DG CHEST PORT 1 VIEW  Result Date: 09/20/2021 CLINICAL DATA:  Central line placement EXAM: PORTABLE CHEST 1 VIEW COMPARISON:  06/18/2020 FINDINGS: Left central line in place with the tip in the right atrium. No pneumothorax. Heart and mediastinal contours are within normal limits. No focal opacities or effusions. No acute bony abnormality. Aortic atherosclerosis. IMPRESSION: No active cardiopulmonary disease. Electronically Signed   By: Rolm Baptise M.D.   On: 09/20/2021 20:37   [place] EKG SITE RITE  Result Date: 09/20/2021 If Site Rite image not attached, placement could not be confirmed due to current cardiac rhythm.       Scheduled Meds:  atorvastatin  10 mg Oral Once per day on Mon Thu   calcitRIOL  1.75 mcg Oral Q M,W,F-HD   Chlorhexidine Gluconate Cloth  6 each Topical Q0600   [START ON 09/25/2021] darbepoetin (ARANESP) injection - DIALYSIS  200 mcg Intravenous Q Mon-HD   pantoprazole  40 mg Oral BID   sevelamer carbonate  3,200 mg Oral TID WC   sodium chloride flush  10-40 mL Intracatheter Q12H   Continuous Infusions:  sodium chloride Stopped (09/18/21 0737)     LOS: 5 days   Time spent= 35 mins    Newman Waren Arsenio Loader, MD [facility]  If 7PM-7AM, please contact night-coverage  09/22/2021, 9:12 AM

## 2021-09-22 NOTE — Progress Notes (Signed)
[facility] Progress Note   Subjective:   Pt seen on HD, tolerating procedure well. Hb trended back down to 6.2 today. Reports he does not want another PRBC transfusion, states "I just want them to stop the bleeding." Denies SOB, CP, dizziness, abdominal pain, and nausea.  Objective Vitals:   09/22/21 0710 09/22/21 0716 09/22/21 0730 09/22/21 0800  BP: (!) 95/51 113/63 109/64 109/81  Pulse: 86 82 77 77  Resp: '12 14 12 11  '$ Temp:      TempSrc:      SpO2: 99% 100% 100% 99%  Weight:      Height:       Physical Exam General: Alert male in NAD Heart: RRR, no murmurs, rubs or gallops Lungs: CTA bilaterally without wheezing, rhonchi or rales Abdomen: Soft, non-distended, +BS Extremities: No edema b/l lower extremities Dialysis Access:  RUE AVF accessed  Additional Objective Labs: Basic Metabolic Panel: Recent Labs  Lab 09/19/21 1109 09/20/21 0457 09/21/21 0341 09/22/21 0450  NA 136 137 135 133*  K 4.7 4.8 4.2 4.8  CL 97* 94* 96* 95*  CO2 '27 27 29 27  '$ GLUCOSE 109* 92 97 87  BUN 41* 55* 32* 51*  CREATININE 8.38* 10.12* 6.99* 9.57*  CALCIUM 8.6* 8.6* 8.2* 8.2*  PHOS 4.3  --   --   --    Liver Function Tests: Recent Labs  Lab 09/17/21 1956 09/18/21 0636 09/19/21 1109  AST 17 17  --   ALT 13 11  --   ALKPHOS 71 54  --   BILITOT 0.6 0.6  --   PROT 6.7 6.2*  --   ALBUMIN 2.8* 2.6* 2.4*   No results for input(s): "LIPASE", "AMYLASE" in the last 168 hours. CBC: Recent Labs  Lab 09/18/21 2258 09/19/21 0834 09/20/21 0457 09/21/21 0341 09/22/21 0450  WBC 9.9 7.6 7.0 8.1 7.5  HGB 7.4* 6.7* 6.0* 6.7* 6.2*  HCT 22.6* 21.2* 18.2* 20.4* 19.0*  MCV 89.3 91.0 90.1 90.3 90.0  PLT 216 217 205 186 225   Blood Culture    Component Value Date/Time   SDES BLOOD LEFT HAND 08/01/2015 0624   SPECREQUEST BOTTLES DRAWN AEROBIC AND ANAEROBIC  5CC 08/01/2015 0624   CULT NO GROWTH 5 DAYS 08/01/2015 0624   REPTSTATUS 08/06/2015 FINAL 08/01/2015 0624    Cardiac  Enzymes: No results for input(s): "CKTOTAL", "CKMB", "CKMBINDEX", "TROPONINI" in the last 168 hours. CBG: No results for input(s): "GLUCAP" in the last 168 hours. Iron Studies: No results for input(s): "IRON", "TIBC", "TRANSFERRIN", "FERRITIN" in the last 72 hours. '@lablastinr3'$ @ Studies/Results: CT ANGIO GI BLEED  Result Date: 09/21/2021 CLINICAL DATA:  Lower gastrointestinal bleeding. EXAM: CTA ABDOMEN AND PELVIS WITHOUT AND WITH CONTRAST TECHNIQUE: Multidetector CT imaging of the abdomen and pelvis was performed using the standard protocol during bolus administration of intravenous contrast. Multiplanar reconstructed images and MIPs were obtained and reviewed to evaluate the vascular anatomy. RADIATION DOSE REDUCTION: This exam was performed according to the departmental dose-optimization program which includes automated exposure control, adjustment of the mA and/or kV according to patient size and/or use of iterative reconstruction technique. CONTRAST:  111m OMNIPAQUE IOHEXOL 350 MG/ML SOLN COMPARISON:  September 13, 2021. FINDINGS: VASCULAR Aorta: Atherosclerosis of abdominal aorta is noted without aneurysm or dissection. Celiac: Patent without evidence of aneurysm, dissection, vasculitis or significant stenosis. SMA: Patent without evidence of aneurysm, dissection, vasculitis or significant stenosis. 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 IMA: Patent without evidence of  aneurysm, dissection, vasculitis or significant stenosis. Inflow: Patent without evidence of aneurysm, dissection, vasculitis or significant stenosis. Proximal Outflow: Bilateral common femoral and visualized portions of the superficial and profunda femoral arteries are patent without evidence of aneurysm, dissection, vasculitis or significant stenosis. Veins: No obvious venous abnormality within the limitations of this arterial phase study. Review of the MIP images  confirms the above findings. NON-VASCULAR Lower chest: No acute abnormality. Hepatobiliary: No focal liver abnormality is seen. No gallstones, gallbladder wall thickening, or biliary dilatation. Pancreas: Unremarkable. No pancreatic ductal dilatation or surrounding inflammatory changes. Spleen: Normal in size without focal abnormality. Adrenals/Urinary Tract: Adrenal glands appear normal. Status post bilateral nephrectomy. Urinary bladder is not well visualized due to scatter artifact arising from left hip prosthesis. Stomach/Bowel: Stomach is unremarkable. Patient is status post right hemicolectomy. There is no evidence of bowel obstruction or inflammation. No definite gastrointestinal bleeding is noted. Lymphatic: No adenopathy is noted. Reproductive: Prostate is unremarkable. Other: No abdominal wall hernia or abnormality. No abdominopelvic ascites. Musculoskeletal: Findings consistent with renal osteodystrophy. No acute osseous abnormality is noted. Status post left total hip arthroplasty. IMPRESSION: VASCULAR Atherosclerosis of abdominal aorta is noted without aneurysm or dissection. No definite evidence of contrast extravasation to suggest active gastrointestinal bleeding. NON-VASCULAR Status post bilateral nephrectomy. Status post right hemicolectomy. No definite acute abnormality seen in the abdomen or pelvis. Electronically Signed   By: Marijo Conception M.D.   On: 09/21/2021 09:26   DG CHEST PORT 1 VIEW  Result Date: 09/20/2021 CLINICAL DATA:  Central line placement EXAM: PORTABLE CHEST 1 VIEW COMPARISON:  06/18/2020 FINDINGS: Left central line in place with the tip in the right atrium. No pneumothorax. Heart and mediastinal contours are within normal limits. No focal opacities or effusions. No acute bony abnormality. Aortic atherosclerosis. IMPRESSION: No active cardiopulmonary disease. Electronically Signed   By: Rolm Baptise M.D.   On: 09/20/2021 20:37   [place] EKG SITE RITE  Result Date: 09/20/2021 If  Site Rite image not attached, placement could not be confirmed due to current cardiac rhythm.  Medications:  sodium chloride Stopped (09/18/21 0737)    sodium chloride   Intravenous Once   atorvastatin  10 mg Oral Once per day on Mon Thu   calcitRIOL  1.75 mcg Oral Q M,W,F-HD   Chlorhexidine Gluconate Cloth  6 each Topical Q0600   [START ON 09/25/2021] darbepoetin (ARANESP) injection - DIALYSIS  200 mcg Intravenous Q Mon-HD   pantoprazole  40 mg Oral BID   sevelamer carbonate  800 mg Oral TID WC   sodium chloride flush  10-40 mL Intracatheter Q12H    Dialysis Orders: MWF [facility]  3h 38mn    400/700   87.5kg  2K/3Ca bath  Hep 3500  AVF RFA - last HD 8/4, post wt 87kg, idwg's low recently - last Hb 9.8 on 8/4, last tsat 32% and ferritin 1435 in July - last mircera 75 ug on 7/26, q2, due next week - calcitriol 1.75 ug tiw po  Assessment/Plan: GI bleed - recurrent, here last week w/ EGF showing nonbleeding GU/ DU's. Hb down, FOB+. Per pmd/ GI.  (+[place]). Did accept blood on 8/9 - transfused 2 u prbcs. Declines PRBC today. CTA completed yesterday without source of bleeding identified. GI following.  ESRD - on HD MWF. Tolerating HD well, next treatment Monday.  Hyperkalemia - Corrected with HD.  Anemia esrd - See above, Hb dropping w/ active GIB. S/p 2U prbcs 8/9. Received ESA early (darbe 100 ug  on 8/7). Increase next ESA dose.  Tsat 11% Ferritin 1494. Iron dextran allergy in chart but has received IV Fe as outpatient. Just had PRBC transfusion, will recheck iron levels in a few days.  MBD ckd - CCa and phos in range, cont sevelamer as binder, cont po vdra w/ HD.  Only getting 1 binder per meal here but reports he takes 5 per meal at home, so his family brought in his binders. PO intake is decreased so will change binder dose to 4 per meal for now.  BP/ vol - BP soft -better after transfusion. Not BP lowering meds. Minimal UF with HD.  H/o CVA    [name] Paganini, PA-C 09/22/2021,  8:09 AM  [facility] Pager: [phone]

## 2021-09-22 NOTE — Progress Notes (Signed)
Received report on pt from [place], [place]. Pt in dialysis at this time.

## 2021-09-22 NOTE — H&P (View-Only) (Signed)
Attending physician's note   I have taken a history, reviewed the chart, and examined the patient. I performed a substantive portion of this encounter, including complete performance of at least one of the key components, in conjunction with the APP. I agree with the APP's note, impression, and recommendations with my edits.   He had another episode of hematochezia after dialysis today, described as maroon-colored stool and large volume.  Otherwise hemodynamically stable.  Hemoglobin slightly down to 6.2 (from 6.7 yesterday).  We discussed multiple potential etiologies for recurrent bleeding based on recent EGD (multiple gastric ulcers and erosions, gastritis, multiple duodenal ulcers, duodenitis) and colonoscopy (ischemic colitis in transverse colon, pandiverticulosis, internal hemorrhoids).  - Repeat hemoglobin later today and in the morning with additional blood products as needed per protocol.  Discussed RBC transfusion with patient again today, and he again informs [place] that he is willing to accept transfusion - Clears for now with n.p.o. at midnight - Bowel prep this evening - Plan for push enteroscopy, colonoscopy +/- VCE tomorrow for diagnostic and therapeutic intent  The indications, risks, and benefits of EGD and colonoscopy and video capsule endoscopy were explained to the patient in detail. Risks include but are not limited to bleeding, perforation, adverse reaction to medications, and cardiopulmonary compromise. Sequelae include but are not limited to the possibility of surgery, hospitalization, and mortality. The patient verbalized understanding and wished to proceed. All questions answered, referred to scheduler and bowel prep ordered. Further recommendations pending results of the exam.    Gerrit Heck, DO, FACG [phone] office         Daily Progress Note  Hospital Day: 6  Chief Complaint:   Brief History Casey Fye Sr. is a 61 y.o. male with a pmh not  limited to CVA, RCC s/p bilateral renal nephrectomy, ESRD on MWF HD, HTN, multifactorial anemia, diverticulosis, right hemicolectomy, hemorrhoids, and OSA not on CPAP, depression, GERD, PUD, migraines. Recent admission for GI bleed thought to be from ischemic colitis or diverticular hemorrhage. Returned to ED on 8/6 with recurrent hematochezia.    Assessment / Plan   # 61 yo male with recurrent painless hematochezia after recent hospitalizaton for the same during which time he had EGD and colonoscopy showing nonbleeding, clean-based gastric and duodenal ulcers, duodenitis, gastritis, segmental colitis (likely ischemic) and pandiverticulosis in colon.  CTA this admission on 8/10 was negative.  He didn't bleed yesterday but about an hour ago had another episodes of large volume painless hematochezia. Mildly tachycardic, BP okay Given persistent bleeding will plan for SBE / repeat colonoscopy, possibly VCE tomorrow. The risks and benefits of EGD and colonoscopy were discussed and the patient agrees to proceed.   # Acute on chronic anemia.  Usually declines bleed transfusions ( religous reasons ) but did get 2 units PRBCs on 8/9 for hgb of 6. No significant improvement in hgb after transfusion due to persistent bleeding. Hgb 6.2 today and that was before the episode of bleeding.  Will contact [facility]. Should get more blood if he is wiling   Subjective   Had an episode of hematochezia about an hour ago. He describes it as large volume. No abdominal pain. Feels okay otherwise  Objective    Imaging:  CT ANGIO GI BLEED  Result Date: 09/21/2021 CLINICAL DATA:  Lower gastrointestinal bleeding. EXAM: CTA ABDOMEN AND PELVIS WITHOUT AND WITH CONTRAST TECHNIQUE: Multidetector CT imaging of the abdomen and pelvis was performed using the standard protocol during bolus administration of intravenous  contrast. Multiplanar reconstructed images and MIPs were obtained and reviewed to evaluate the vascular anatomy.  RADIATION DOSE REDUCTION: This exam was performed according to the departmental dose-optimization program which includes automated exposure control, adjustment of the mA and/or kV according to patient size and/or use of iterative reconstruction technique. CONTRAST:  160m OMNIPAQUE IOHEXOL 350 MG/ML SOLN COMPARISON:  September 13, 2021. FINDINGS: VASCULAR Aorta: Atherosclerosis of abdominal aorta is noted without aneurysm or dissection. Celiac: Patent without evidence of aneurysm, dissection, vasculitis or significant stenosis. SMA: Patent without evidence of aneurysm, dissection, vasculitis or significant stenosis. 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 IMA: Patent without evidence of aneurysm, dissection, vasculitis or significant stenosis. Inflow: Patent without evidence of aneurysm, dissection, vasculitis or significant stenosis. Proximal Outflow: Bilateral common femoral and visualized portions of the superficial and profunda femoral arteries are patent without evidence of aneurysm, dissection, vasculitis or significant stenosis. Veins: No obvious venous abnormality within the limitations of this arterial phase study. Review of the MIP images confirms the above findings. NON-VASCULAR Lower chest: No acute abnormality. Hepatobiliary: No focal liver abnormality is seen. No gallstones, gallbladder wall thickening, or biliary dilatation. Pancreas: Unremarkable. No pancreatic ductal dilatation or surrounding inflammatory changes. Spleen: Normal in size without focal abnormality. Adrenals/Urinary Tract: Adrenal glands appear normal. Status post bilateral nephrectomy. Urinary bladder is not well visualized due to scatter artifact arising from left hip prosthesis. Stomach/Bowel: Stomach is unremarkable. Patient is status post right hemicolectomy. There is no evidence of bowel obstruction or inflammation. No definite gastrointestinal bleeding is noted.  Lymphatic: No adenopathy is noted. Reproductive: Prostate is unremarkable. Other: No abdominal wall hernia or abnormality. No abdominopelvic ascites. Musculoskeletal: Findings consistent with renal osteodystrophy. No acute osseous abnormality is noted. Status post left total hip arthroplasty. IMPRESSION: VASCULAR Atherosclerosis of abdominal aorta is noted without aneurysm or dissection. No definite evidence of contrast extravasation to suggest active gastrointestinal bleeding. NON-VASCULAR Status post bilateral nephrectomy. Status post right hemicolectomy. No definite acute abnormality seen in the abdomen or pelvis. Electronically Signed   By: JMarijo ConceptionM.D.   On: 09/21/2021 09:26   DG CHEST PORT 1 VIEW  Result Date: 09/20/2021 CLINICAL DATA:  Central line placement EXAM: PORTABLE CHEST 1 VIEW COMPARISON:  06/18/2020 FINDINGS: Left central line in place with the tip in the right atrium. No pneumothorax. Heart and mediastinal contours are within normal limits. No focal opacities or effusions. No acute bony abnormality. Aortic atherosclerosis. IMPRESSION: No active cardiopulmonary disease. Electronically Signed   By: KRolm BaptiseM.D.   On: 09/20/2021 20:37   [place] SITE R[place]  Result Date: 09/20/2021 If Site Rite image not attached, placement could not be confirmed due to current cardiac rhythm.   Lab Results: Recent Labs    09/20/21 0457 09/21/21 0341 09/22/21 0450  WBC 7.0 8.1 7.5  HGB 6.0* 6.7* 6.2*  HCT 18.2* 20.4* 19.0*  PLT 205 186 225   BMET Recent Labs    09/20/21 0457 09/21/21 0341 09/22/21 0450  NA 137 135 133*  K 4.8 4.2 4.8  CL 94* 96* 95*  CO2 '27 29 27  '$ GLUCOSE 92 97 87  BUN 55* 32* 51*  CREATININE 10.12* 6.99* 9.57*  CALCIUM 8.6* 8.2* 8.2*   LFT No results for input(s): "PROT", "ALBUMIN", "AST", "ALT", "ALKPHOS", "BILITOT", "BILIDIR", "IBILI" in the last 72 hours. PT/INR No results for input(s): "LABPROT", "INR" in the last 72 hours.   Scheduled inpatient  medications:   atorvastatin  10 mg Oral Once per day on Mon Thu   calcitRIOL  1.75 mcg Oral Q M,W,F-HD   Chlorhexidine Gluconate Cloth  6 each Topical Q0600   [START ON 09/25/2021] darbepoetin (ARANESP) injection - DIALYSIS  200 mcg Intravenous Q Mon-HD   pantoprazole  40 mg Oral BID   sevelamer carbonate  3,200 mg Oral TID WC   sodium chloride flush  10-40 mL Intracatheter Q12H   Continuous inpatient infusions:   sodium chloride Stopped (09/18/21 0737)   PRN inpatient medications: acetaminophen, albuterol, guaiFENesin, hydrALAZINE, metoprolol tartrate, ondansetron **OR** ondansetron (ZOFRAN) IV, senna-docusate, sodium chloride, sodium chloride flush, sodium chloride flush, traMADol, traZODone  Vital signs in last 24 hours: Temp:  [97.8 F (36.6 C)-98.7 F (37.1 C)] 98.7 F (37.1 C) (08/11 1249) Pulse Rate:  [77-116] 107 (08/11 1149) Resp:  [8-19] 16 (08/11 1249) BP: (80-141)/(44-105) 92/61 (08/11 1249) SpO2:  [95 %-100 %] 98 % (08/11 1249) Weight:  [93.6 kg-97.5 kg] 93.6 kg (08/11 0701) Last BM Date : 09/22/21 (bloody stool. Gerlean Ren, MD notified)  Intake/Output Summary (Last 24 hours) at 09/22/2021 1326 Last data filed at 09/22/2021 1123 Gross per 24 hour  Intake --  Output 1800 ml  Net -1800 ml   Physical Exam:  General: Alert male in NAD Heart:  Regular rate and rhythm. No lower extremity edema Pulmonary: Normal respiratory effort Abdomen: Soft, nondistended, nontender. Normal bowel sounds.  Neurologic: Alert and oriented Psych: Pleasant. Cooperative.    Intake/Output from previous day: No intake/output data recorded. Intake/Output this shift: Total I/O In: -  Out: 1800 [Other:1800]    Active Problems:   Anemia   GI bleed     LOS: 5 days   Tye Savoy ,NP 09/22/2021, 1:26 PM

## 2021-09-22 NOTE — Progress Notes (Addendum)
Attending physician's note   I have taken a history, reviewed the chart, and examined the patient. I performed a substantive portion of this encounter, including complete performance of at least one of the key components, in conjunction with the APP. I agree with the APP's note, impression, and recommendations with my edits.   He had another episode of hematochezia after dialysis today, described as maroon-colored stool and large volume.  Otherwise hemodynamically stable.  Hemoglobin slightly down to 6.2 (from 6.7 yesterday).  We discussed multiple potential etiologies for recurrent bleeding based on recent EGD (multiple gastric ulcers and erosions, gastritis, multiple duodenal ulcers, duodenitis) and colonoscopy (ischemic colitis in transverse colon, pandiverticulosis, internal hemorrhoids).  - Repeat hemoglobin later today and in the morning with additional blood products as needed per protocol.  Discussed RBC transfusion with patient again today, and he again informs [place] that he is willing to accept transfusion - Clears for now with n.p.o. at midnight - Bowel prep this evening - Plan for push enteroscopy, colonoscopy +/- VCE tomorrow for diagnostic and therapeutic intent  The indications, risks, and benefits of EGD and colonoscopy and video capsule endoscopy were explained to the patient in detail. Risks include but are not limited to bleeding, perforation, adverse reaction to medications, and cardiopulmonary compromise. Sequelae include but are not limited to the possibility of surgery, hospitalization, and mortality. The patient verbalized understanding and wished to proceed. All questions answered, referred to scheduler and bowel prep ordered. Further recommendations pending results of the exam.    Gerrit Heck, DO, FACG [phone] office         Daily Progress Note  Hospital Day: 6  Chief Complaint:   Brief History Ralphael Southgate Sr. is a 61 y.o. male with a pmh not  limited to CVA, RCC s/p bilateral renal nephrectomy, ESRD on MWF HD, HTN, multifactorial anemia, diverticulosis, right hemicolectomy, hemorrhoids, and OSA not on CPAP, depression, GERD, PUD, migraines. Recent admission for GI bleed thought to be from ischemic colitis or diverticular hemorrhage. Returned to ED on 8/6 with recurrent hematochezia.    Assessment / Plan   # 61 yo male with recurrent painless hematochezia after recent hospitalizaton for the same during which time he had EGD and colonoscopy showing nonbleeding, clean-based gastric and duodenal ulcers, duodenitis, gastritis, segmental colitis (likely ischemic) and pandiverticulosis in colon.  CTA this admission on 8/10 was negative.  He didn't bleed yesterday but about an hour ago had another episodes of large volume painless hematochezia. Mildly tachycardic, BP okay Given persistent bleeding will plan for SBE / repeat colonoscopy, possibly VCE tomorrow. The risks and benefits of EGD and colonoscopy were discussed and the patient agrees to proceed.   # Acute on chronic anemia.  Usually declines bleed transfusions ( religous reasons ) but did get 2 units PRBCs on 8/9 for hgb of 6. No significant improvement in hgb after transfusion due to persistent bleeding. Hgb 6.2 today and that was before the episode of bleeding.  Will contact [facility]. Should get more blood if he is wiling   Subjective   Had an episode of hematochezia about an hour ago. He describes it as large volume. No abdominal pain. Feels okay otherwise  Objective    Imaging:  CT ANGIO GI BLEED  Result Date: 09/21/2021 CLINICAL DATA:  Lower gastrointestinal bleeding. EXAM: CTA ABDOMEN AND PELVIS WITHOUT AND WITH CONTRAST TECHNIQUE: Multidetector CT imaging of the abdomen and pelvis was performed using the standard protocol during bolus administration of intravenous  contrast. Multiplanar reconstructed images and MIPs were obtained and reviewed to evaluate the vascular anatomy.  RADIATION DOSE REDUCTION: This exam was performed according to the departmental dose-optimization program which includes automated exposure control, adjustment of the mA and/or kV according to patient size and/or use of iterative reconstruction technique. CONTRAST:  132m OMNIPAQUE IOHEXOL 350 MG/ML SOLN COMPARISON:  September 13, 2021. FINDINGS: VASCULAR Aorta: Atherosclerosis of abdominal aorta is noted without aneurysm or dissection. Celiac: Patent without evidence of aneurysm, dissection, vasculitis or significant stenosis. SMA: Patent without evidence of aneurysm, dissection, vasculitis or significant stenosis. 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 IMA: Patent without evidence of aneurysm, dissection, vasculitis or significant stenosis. Inflow: Patent without evidence of aneurysm, dissection, vasculitis or significant stenosis. Proximal Outflow: Bilateral common femoral and visualized portions of the superficial and profunda femoral arteries are patent without evidence of aneurysm, dissection, vasculitis or significant stenosis. Veins: No obvious venous abnormality within the limitations of this arterial phase study. Review of the MIP images confirms the above findings. NON-VASCULAR Lower chest: No acute abnormality. Hepatobiliary: No focal liver abnormality is seen. No gallstones, gallbladder wall thickening, or biliary dilatation. Pancreas: Unremarkable. No pancreatic ductal dilatation or surrounding inflammatory changes. Spleen: Normal in size without focal abnormality. Adrenals/Urinary Tract: Adrenal glands appear normal. Status post bilateral nephrectomy. Urinary bladder is not well visualized due to scatter artifact arising from left hip prosthesis. Stomach/Bowel: Stomach is unremarkable. Patient is status post right hemicolectomy. There is no evidence of bowel obstruction or inflammation. No definite gastrointestinal bleeding is noted.  Lymphatic: No adenopathy is noted. Reproductive: Prostate is unremarkable. Other: No abdominal wall hernia or abnormality. No abdominopelvic ascites. Musculoskeletal: Findings consistent with renal osteodystrophy. No acute osseous abnormality is noted. Status post left total hip arthroplasty. IMPRESSION: VASCULAR Atherosclerosis of abdominal aorta is noted without aneurysm or dissection. No definite evidence of contrast extravasation to suggest active gastrointestinal bleeding. NON-VASCULAR Status post bilateral nephrectomy. Status post right hemicolectomy. No definite acute abnormality seen in the abdomen or pelvis. Electronically Signed   By: JMarijo ConceptionM.D.   On: 09/21/2021 09:26   DG CHEST PORT 1 VIEW  Result Date: 09/20/2021 CLINICAL DATA:  Central line placement EXAM: PORTABLE CHEST 1 VIEW COMPARISON:  06/18/2020 FINDINGS: Left central line in place with the tip in the right atrium. No pneumothorax. Heart and mediastinal contours are within normal limits. No focal opacities or effusions. No acute bony abnormality. Aortic atherosclerosis. IMPRESSION: No active cardiopulmonary disease. Electronically Signed   By: KRolm BaptiseM.D.   On: 09/20/2021 20:37   [place] SITE RITE  Result Date: 09/20/2021 If Site Rite image not attached, placement could not be confirmed due to current cardiac rhythm.   Lab Results: Recent Labs    09/20/21 0457 09/21/21 0341 09/22/21 0450  WBC 7.0 8.1 7.5  HGB 6.0* 6.7* 6.2*  HCT 18.2* 20.4* 19.0*  PLT 205 186 225   BMET Recent Labs    09/20/21 0457 09/21/21 0341 09/22/21 0450  NA 137 135 133*  K 4.8 4.2 4.8  CL 94* 96* 95*  CO2 '27 29 27  '$ GLUCOSE 92 97 87  BUN 55* 32* 51*  CREATININE 10.12* 6.99* 9.57*  CALCIUM 8.6* 8.2* 8.2*   LFT No results for input(s): "PROT", "ALBUMIN", "AST", "ALT", "ALKPHOS", "BILITOT", "BILIDIR", "IBILI" in the last 72 hours. PT/INR No results for input(s): "LABPROT", "INR" in the last 72 hours.   Scheduled inpatient  medications:   atorvastatin  10 mg Oral Once per day on Mon Thu   calcitRIOL  1.75 mcg Oral Q M,W,F-HD   Chlorhexidine Gluconate Cloth  6 each Topical Q0600   [START ON 09/25/2021] darbepoetin (ARANESP) injection - DIALYSIS  200 mcg Intravenous Q Mon-HD   pantoprazole  40 mg Oral BID   sevelamer carbonate  3,200 mg Oral TID WC   sodium chloride flush  10-40 mL Intracatheter Q12H   Continuous inpatient infusions:   sodium chloride Stopped (09/18/21 0737)   PRN inpatient medications: acetaminophen, albuterol, guaiFENesin, hydrALAZINE, metoprolol tartrate, ondansetron **OR** ondansetron (ZOFRAN) IV, senna-docusate, sodium chloride, sodium chloride flush, sodium chloride flush, traMADol, traZODone  Vital signs in last 24 hours: Temp:  [97.8 F (36.6 C)-98.7 F (37.1 C)] 98.7 F (37.1 C) (08/11 1249) Pulse Rate:  [77-116] 107 (08/11 1149) Resp:  [8-19] 16 (08/11 1249) BP: (80-141)/(44-105) 92/61 (08/11 1249) SpO2:  [95 %-100 %] 98 % (08/11 1249) Weight:  [93.6 kg-97.5 kg] 93.6 kg (08/11 0701) Last BM Date : 09/22/21 (bloody stool. Gerlean Ren, MD notified)  Intake/Output Summary (Last 24 hours) at 09/22/2021 1326 Last data filed at 09/22/2021 1123 Gross per 24 hour  Intake --  Output 1800 ml  Net -1800 ml   Physical Exam:  General: Alert male in NAD Heart:  Regular rate and rhythm. No lower extremity edema Pulmonary: Normal respiratory effort Abdomen: Soft, nondistended, nontender. Normal bowel sounds.  Neurologic: Alert and oriented Psych: Pleasant. Cooperative.    Intake/Output from previous day: No intake/output data recorded. Intake/Output this shift: Total I/O In: -  Out: 1800 [Other:1800]    Active Problems:   Anemia   GI bleed     LOS: 5 days   Tye Savoy ,NP 09/22/2021, 1:26 PM

## 2021-09-22 NOTE — Procedures (Addendum)
HD Note  Patient tolerated his HD treatment well.  He was observed resting while watching TV, on the phone, and occasionally with eyes closed.  Some data was entered later than the documented time.  This was due to patient care.  In the last minute of scheduled treatment, patient BP dropped to 80/50.  Subsequent BP were as low as 65/47.  Patient complained of feeling like he needed to have a BM, confirmed that it could be possible cramping. Patient was positioned with his head down and feet up in the bed.  162m saline bolus was given. Patient alert and oriented and stating that he feels fine except having the sensation that he needs to have a BM  Patient stabilized and was transported back to his room.  (See dialysis flow sheet.)

## 2021-09-23 ENCOUNTER — Encounter (HOSPITAL_COMMUNITY): Payer: Self-pay | Admitting: Internal Medicine

## 2021-09-23 ENCOUNTER — Encounter (HOSPITAL_COMMUNITY)
Admission: EM | Disposition: A | Discharge status: Home / Self Care | Payer: Self-pay | Source: Home / Self Care | Attending: Internal Medicine

## 2021-09-23 ENCOUNTER — Inpatient Hospital Stay (HOSPITAL_COMMUNITY): Payer: Medicare Other | Admitting: Certified Registered Nurse Anesthetist

## 2021-09-23 DIAGNOSIS — D62 Acute posthemorrhagic anemia: Secondary | ICD-10-CM | POA: Diagnosis not present

## 2021-09-23 DIAGNOSIS — K641 Second degree hemorrhoids: Secondary | ICD-10-CM

## 2021-09-23 DIAGNOSIS — K5730 Diverticulosis of large intestine without perforation or abscess without bleeding: Secondary | ICD-10-CM

## 2021-09-23 DIAGNOSIS — K254 Chronic or unspecified gastric ulcer with hemorrhage: Secondary | ICD-10-CM

## 2021-09-23 DIAGNOSIS — K2990 Gastroduodenitis, unspecified, without bleeding: Secondary | ICD-10-CM

## 2021-09-23 DIAGNOSIS — K2970 Gastritis, unspecified, without bleeding: Secondary | ICD-10-CM

## 2021-09-23 DIAGNOSIS — K633 Ulcer of intestine: Secondary | ICD-10-CM

## 2021-09-23 DIAGNOSIS — G4730 Sleep apnea, unspecified: Secondary | ICD-10-CM

## 2021-09-23 DIAGNOSIS — E875 Hyperkalemia: Secondary | ICD-10-CM | POA: Diagnosis not present

## 2021-09-23 DIAGNOSIS — K559 Vascular disorder of intestine, unspecified: Secondary | ICD-10-CM

## 2021-09-23 DIAGNOSIS — F418 Other specified anxiety disorders: Secondary | ICD-10-CM

## 2021-09-23 DIAGNOSIS — D649 Anemia, unspecified: Secondary | ICD-10-CM | POA: Diagnosis not present

## 2021-09-23 DIAGNOSIS — K3189 Other diseases of stomach and duodenum: Secondary | ICD-10-CM | POA: Diagnosis not present

## 2021-09-23 DIAGNOSIS — Z7189 Other specified counseling: Secondary | ICD-10-CM

## 2021-09-23 DIAGNOSIS — K625 Hemorrhage of anus and rectum: Secondary | ICD-10-CM | POA: Diagnosis not present

## 2021-09-23 HISTORY — PX: GIVENS CAPSULE STUDY: SHX5432

## 2021-09-23 HISTORY — PX: COLONOSCOPY WITH PROPOFOL: SHX5780

## 2021-09-23 HISTORY — PX: ENTEROSCOPY: SHX5533

## 2021-09-23 HISTORY — PX: HOT HEMOSTASIS: SHX5433

## 2021-09-23 LAB — CBC
HCT: 18.9 % — ABNORMAL LOW (ref 39.0–52.0)
Hemoglobin: 6.1 g/dL — CL (ref 13.0–17.0)
MCH: 29.6 pg (ref 26.0–34.0)
MCHC: 32.3 g/dL (ref 30.0–36.0)
MCV: 91.7 fL (ref 80.0–100.0)
Platelets: 232 10*3/uL (ref 150–400)
RBC: 2.06 MIL/uL — ABNORMAL LOW (ref 4.22–5.81)
RDW: 17.2 % — ABNORMAL HIGH (ref 11.5–15.5)
WBC: 9.1 10*3/uL (ref 4.0–10.5)
nRBC: 0 % (ref 0.0–0.2)

## 2021-09-23 LAB — IRON AND TIBC
Iron: 33 ug/dL — ABNORMAL LOW (ref 45–182)
Saturation Ratios: 15 % — ABNORMAL LOW (ref 17.9–39.5)
TIBC: 214 ug/dL — ABNORMAL LOW (ref 250–450)
UIBC: 181 ug/dL

## 2021-09-23 LAB — BASIC METABOLIC PANEL
Anion gap: 11 (ref 5–15)
BUN: 25 mg/dL — ABNORMAL HIGH (ref 8–23)
CO2: 28 mmol/L (ref 22–32)
Calcium: 9 mg/dL (ref 8.9–10.3)
Chloride: 97 mmol/L — ABNORMAL LOW (ref 98–111)
Creatinine, Ser: 6.63 mg/dL — ABNORMAL HIGH (ref 0.61–1.24)
GFR, Estimated: 9 mL/min — ABNORMAL LOW (ref >60)
Glucose, Bld: 96 mg/dL (ref 70–99)
Potassium: 4.5 mmol/L (ref 3.5–5.1)
Sodium: 136 mmol/L (ref 135–145)

## 2021-09-23 LAB — RETICULOCYTES
Immature Retic Fract: 19.5 % — ABNORMAL HIGH (ref 2.3–15.9)
RBC.: 2.11 MIL/uL — ABNORMAL LOW (ref 4.22–5.81)
Retic Count, Absolute: 55.9 10*3/uL (ref 19.0–186.0)
Retic Ct Pct: 2.7 % (ref 0.4–3.1)

## 2021-09-23 LAB — FOLATE: Folate: 38.5 ng/mL (ref >5.9)

## 2021-09-23 LAB — VITAMIN B12: Vitamin B-12: 1482 pg/mL — ABNORMAL HIGH (ref 180–914)

## 2021-09-23 LAB — FERRITIN: Ferritin: 1137 ng/mL — ABNORMAL HIGH (ref 24–336)

## 2021-09-23 SURGERY — COLONOSCOPY WITH PROPOFOL
Anesthesia: Monitor Anesthesia Care

## 2021-09-23 SURGERY — IMAGING PROCEDURE, GI TRACT, INTRALUMINAL, VIA CAPSULE

## 2021-09-23 MED ORDER — FOLIC ACID 1 MG PO TABS
1.0000 mg | ORAL_TABLET | Freq: Every day | ORAL | Status: DC
  Administered 2021-09-23 – 2021-09-28 (×6): 1 mg via ORAL
  Filled 2021-09-23 (×6): qty 1

## 2021-09-23 MED ORDER — ALBUMIN HUMAN 5 % IV SOLN: INTRAVENOUS | Status: DC | PRN

## 2021-09-23 MED ORDER — SODIUM CHLORIDE 0.9 % IV SOLN
250.0000 mg | Freq: Every day | INTRAVENOUS | Status: AC
Start: 2021-09-23 — End: 2021-09-26
  Administered 2021-09-23 – 2021-09-26 (×4): 250 mg via INTRAVENOUS
  Filled 2021-09-23 (×4): qty 20

## 2021-09-23 MED ORDER — SODIUM CHLORIDE 0.9 % IV SOLN
INTRAVENOUS | Status: AC | PRN
  Administered 2021-09-23: 500 mL via INTRAVENOUS

## 2021-09-23 MED ORDER — PROPOFOL 500 MG/50ML IV EMUL
INTRAVENOUS | Status: DC | PRN
  Administered 2021-09-23: 75 ug/kg/min via INTRAVENOUS

## 2021-09-23 MED ORDER — SODIUM CHLORIDE 0.9 % IV SOLN: INTRAVENOUS | Status: DC | PRN

## 2021-09-23 MED ORDER — PROPOFOL 10 MG/ML IV BOLUS
INTRAVENOUS | Status: DC | PRN
  Administered 2021-09-23: 10 mg via INTRAVENOUS
  Administered 2021-09-23: 40 mg via INTRAVENOUS
  Administered 2021-09-23 (×2): 10 mg via INTRAVENOUS

## 2021-09-23 MED ORDER — PHENYLEPHRINE HCL-NACL 20-0.9 MG/250ML-% IV SOLN
INTRAVENOUS | Status: DC | PRN
  Administered 2021-09-23: 45 ug/min via INTRAVENOUS

## 2021-09-23 SURGICAL SUPPLY — 22 items

## 2021-09-23 NOTE — Transfer of Care (Signed)
Immediate Anesthesia Transfer of Care Note  Patient: Jaequan Propes Sr.  Procedure(s) Performed: COLONOSCOPY WITH PROPOFOL ENTEROSCOPY GIVENS CAPSULE STUDY HOT HEMOSTASIS (ARGON PLASMA COAGULATION/BICAP)  Patient Location: PACU  Anesthesia Type:MAC  Level of Consciousness: drowsy and patient cooperative  Airway & Oxygen Therapy: Patient Spontanous Breathing and Patient connected to nasal cannula oxygen  Post-op Assessment: Report given to RN, Post -op Vital signs reviewed and stable and Patient moving all extremities X 4  Post vital signs: Reviewed and stable  Last Vitals:  Vitals Value Taken Time  BP 115/61 09/23/21 1155  Temp    Pulse 76 09/23/21 1157  Resp 56 09/23/21 1157  SpO2 95 % 09/23/21 1157  Vitals shown include unvalidated device data.  Last Pain:  Vitals:   09/23/21 1014  TempSrc: Temporal  PainSc: 0-No pain      Patients Stated Pain Goal: 6 (20/80/22 3361)  Complications: No notable events documented.

## 2021-09-23 NOTE — Consult Note (Signed)
Consultation Note Date: 09/23/2021   Patient Name: Carl Dilworth Sr.  DOB: 25-Jul-1960  MRN: [id number]  Age / Sex: 61 y.o., male  PCP: [name], MD Referring Physician: Thurnell Lose, MD  Reason for Consultation: Establishing goals of care  HPI/Patient Profile: 61 y.o. male  with past medical history of GI bleeding with refusal of blood products; CVA,ESRD on MWF HD; HTN;diverticulosis,hemorrhoids, and OSA not on CPAP,depression,GERD,PUD, migraines admitted on 09/17/2021 with recurrent GI bleeding after recent admission from 7/31 to 8/2.    Patient is a Sales promotion account executive Witness and has been refusing any further transfusions of blood after receiving 2 units on 8/9 PMT has been consulted to assist with goals of care conversation.  Clinical Assessment and Goals of Care:  I have reviewed medical records including EPIC notes, labs and imaging, assessed the patient and then met at the bedside to discuss diagnosis prognosis, GOC, EOL wishes, disposition and options.  I introduced Palliative Medicine as specialized medical care for people living with serious illness. It focuses on providing relief from the symptoms and stress of a serious illness. The goal is to improve quality of life for both the patient and the family.  We discussed a brief life review of the patient and then focused on their current illness.   I attempted to elicit values and goals of care important to the patient.    Medical History Review and Understanding:  Patient states he has been on dialysis for 15 years and "this is not my first rodeo."  He understands that there is some recurrent GI bleeding with an unclear source.  He understands this is causing him severe anemia and the risk for declining blood.  Social History: Patient is not married.  He lives with his brother.  He has a son and a daughter.  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  Previously he had attended other denominations of [place].  He enjoys watching movies and going to family reunions, occasionally goes to the beach but not so much lately.  He also previously enjoyed fishing.  He finds joy in spreading the word of God.  Functional and Nutritional State: Patient is completely independent with ADLs and drives himself to dialysis, cooks for himself, mows grass.  Reports a good appetite.  Previously use a wheelchair or walker after a fracture but no assistive devices currently.  Palliative Symptoms: Weakness  Advance Directives: A detailed discussion regarding advanced directives was had.  No HCPOA on file.  Patient states he would likely name his son.  Provided with packet for review and offered to assist with arranging notary during this hospitalization if interested.   Code Status: Concepts specific to code status, artifical feeding and hydration, and rehospitalization were considered and discussed.   Discussion: We had a discussion about patient's physical, emotional, and spiritual health.  Discussed his Jehovah's Witness faith at length.  He has made a shift to this recently, although his mother and siblings have been Jehovah's Witness throughout his life.  He has faith God can cure all sickness and looks forward to Resurrection one day even if he does not improve from his sickness in this life.  He initially agreed to receiving blood when it sounded like he was about to die, as he does not want to die before he is baptized as Restaurant manager, fast food.  However, he now has reflected further and feels he needs to hold steady in his  beliefs.  This also influences his decision to be a full code at this time.  After his baptism, he feels that his heart will be in God's hands and may consider changing CODE STATUS.  He hates being sick, as he cannot really go anywhere due to constantly going to and from dialysis.  Overall  he feels like his quality of life is poor but he is grateful to God to be alive.  He states "what is going to be is going to be" he appreciates open and honest updates, as he does not want to be blind to the facts of what is going on.    Discussed the importance of continued conversation with family and the medical providers regarding overall plan of care and treatment options, ensuring decisions are within the context of the patient's values and GOCs.   Questions and concerns were addressed.  Hard Choices booklet left for review. The family was encouraged to call with questions or concerns.  PMT will continue to support holistically.   SUMMARY OF RECOMMENDATIONS   -Full code/full scope treatment, NO blood transfusions -Patient remains motivated to prolong life as long as possible with all other available interventions besides blood -Advance care planning was discussed and will follow-up on whether patient wishes to complete AD during this admission -Psychosocial emotional support provided -PMT will continue to follow and support   Prognosis:  Unable to determine  Discharge Planning: To Be Determined      Primary Diagnoses: Present on Admission:  GI bleed    Physical Exam Vitals and nursing note reviewed.  Constitutional:      General: He is not in acute distress. Cardiovascular:     Rate and Rhythm: Normal rate.  Pulmonary:     Effort: Pulmonary effort is normal.  Skin:    General: Skin is warm and dry.  Neurological:     Mental Status: He is alert.  Psychiatric:        Mood and Affect: Mood normal.     Vital Signs: BP (!) 99/59   Pulse 73   Temp 98 F (36.7 C) (Temporal)   Resp (!) 23   Ht '6\' 2"'  (1.88 m)   Wt 87.8 kg   SpO2 100%   BMI 24.85 kg/m  Pain Scale: Faces POSS *See Group Information*: 1-Acceptable,Awake and alert Pain Score: 0-No pain   SpO2: SpO2: 100 % O2 Device:SpO2: 100 % O2 Flow Rate: .O2 Flow Rate (L/min): 2 L/min   Palliative  Assessment/Data: 60 -70%    MDM: High   Myrtice Lowdermilk Johnnette Litter, PA-C  Palliative Medicine Team Team phone # [phone]  Thank you for allowing the Palliative Medicine Team to assist in the care of this patient. Please utilize secure chat with additional questions, if there is no response within 30 minutes please call the above phone number.  Palliative Medicine Team providers are available by phone from 7am to 7pm daily and can be reached through the team cell phone.  Should this patient require assistance outside of these hours, please call the patient's attending physician.

## 2021-09-23 NOTE — Op Note (Signed)
[facility] Patient Name: [name] Procedure Date : 09/23/2021 MRN: [id number] Attending MD: Gerrit Heck , MD Date of Birth: Jul 09, 1960 CSN: [id number] Age: 61 Admit Type: Inpatient Procedure:                Colonoscopy Indications:              Hematochezia, Acute post hemorrhagic anemia Providers:                Gerrit Heck, MD, Grace Isaac, RN, William Dalton, Technician Referring MD:              Medicines:                Monitored Anesthesia Care Complications:            No immediate complications. Estimated Blood Loss:     Estimated blood loss: none. Procedure:                Pre-Anesthesia Assessment:                           - Prior to the procedure, a History and Physical                            was performed, and patient medications and                            allergies were reviewed. The patient's tolerance of                            previous anesthesia was also reviewed. The risks                            and benefits of the procedure and the sedation                            options and risks were discussed with the patient.                            All questions were answered, and informed consent                            was obtained. Prior Anticoagulants: The patient has                            taken no previous anticoagulant or antiplatelet                            agents. ASA Grade Assessment: IV - A patient with                            severe systemic disease that is a constant threat  to life. After reviewing the risks and benefits,                            the patient was deemed in satisfactory condition to                            undergo the procedure.                           After obtaining informed consent, the colonoscope                            was passed under direct vision. Throughout the                            procedure, the  patient's blood pressure, pulse, and                            oxygen saturations were monitored continuously. The                            [id number]-[id number] ([id number] Olympus peds colonoscope was                            introduced through the anus and advanced to the the                            ileocolonic anastomosis. The colonoscopy was                            technically difficult and complex due to restricted                            mobility of the colon and a tortuous colon.                            Successful completion of the procedure was aided by                            applying abdominal pressure. The patient tolerated                            the procedure well. The quality of the bowel                            preparation was good. The rectum, ileocecal                            anastamosism and neo-terminal ileum were                            photographed. Scope In: 11:23:56 AM Scope Out: 94:76:54 AM Scope Withdrawal Time: 0 hours 9 minutes 0 seconds  Total Procedure Duration: 0 hours 22 minutes 8 seconds  Findings:      Hemorrhoids were found on perianal exam.      There was evidence of a prior end-to-side ileo-colonic anastomosis in       the ascending colon. This was patent and was characterized by healthy       appearing mucosa. The anastomosis was traversed.      Multiple small and large-mouthed diverticula were found in the entire       colon. Lavage of each of these diverticula was performed using copious       amounts of tap water, resulting in clearance with good visualization. No       active bleeding or stigmata of bleeding noted.      Nonbleeding ulcerated mucosa were present in the transverse colon. This       measured 2 cm in length, and was much improved compared with the       previous colonsocopy.      The sigmoid colon was significantly tortuous and reduced mobility.       Advancing the scope required using manual pressure.       Non-bleeding internal hemorrhoids were found during retroflexion. The       hemorrhoids were small and Grade II (internal hemorrhoids that prolapse       but reduce spontaneously). Impression:               - Hemorrhoids found on perianal exam.                           - Patent end-to-side ileo-colonic anastomosis,                            characterized by healthy appearing mucosa.                           - Diverticulosis in the entire examined colon.                           - Localized area of ulcerated mucosa in the                            transverse colon, consistent with ischemic colitis.                            This was much improved compared with the previous                            study. CTA was otherwise without stenosis or                            vascular occlusion.                           - Tortuous colon.                           - Non-bleeding internal hemorrhoids.                           - No specimens collected. Recommendation:           -  Return patient to hospital ward for ongoing care.                           - See EGD report for additional details regarding                            ongoing inpatient management.                           - Repeat colonoscopy prn. Procedure Code(s):        --- Professional ---                           [phone], Colonoscopy, flexible; diagnostic, including                            collection of specimen(s) by brushing or washing,                            when performed (separate procedure) Diagnosis Code(s):        --- Professional ---                           K64.1, Second degree hemorrhoids                           Z98.0, Intestinal bypass and anastomosis status                           K63.3, Ulcer of intestine                           K92.1, Melena (includes Hematochezia)                           D62, Acute posthemorrhagic anemia                           K57.30, Diverticulosis of large intestine  without                            perforation or abscess without bleeding                           Q43.8, Other specified congenital malformations of                            intestine CPT copyright 2019 American Medical Association. All rights reserved. The codes documented in this report are preliminary and upon coder review may  be revised to meet current compliance requirements. Gerrit Heck, MD 09/23/2021 12:13:19 PM Number of Addenda: 0

## 2021-09-23 NOTE — Interval H&P Note (Signed)
History and Physical Interval Note:  Completed bowel prep last night without issue.  Stools liquid and clear/yellow, but no recurrence of bleeding with bowel prep.  Repeat hemoglobin 6.1 this morning.  We discussed repeat blood transfusion at length today.  He currently declines any blood transfusion, but okay with IV iron.  He states "if things get really bad and I absolutely need it, then I am willing to accept blood transfusion".  I explained that it is possible to provoke a bleed during endoscopy, or develop hemodynamic instability, both of which would be good indications for PRBC transfusion.  He agrees that if for lifesaving measures, he is willing to accept PRBC transfusion.  He understands the risks of proceeding with sedation and endoscopic procedures with hemoglobin 6.1, and strongly wishes to proceed for therapeutic intent.  09/23/2021 10:43 AM  Carl Found Sr.  has presented today for surgery, with the diagnosis of hematochezia.  The various methods of treatment have been discussed with the patient and family. After consideration of risks, benefits and other options for treatment, the patient has consented to  Procedure(s) with comments: COLONOSCOPY WITH PROPOFOL (N/A) ENTEROSCOPY (N/A) - Will place order for video capsule study as we may opt to place it during procedure GIVENS CAPSULE STUDY (N/A) as a surgical intervention.  The patient's history has been reviewed, patient examined, no change in status, stable for surgery.  I have reviewed the patient's chart and labs.  Questions were answered to the patient's satisfaction.     Dominic Pea Clifford Benninger

## 2021-09-23 NOTE — Progress Notes (Signed)
Consent obtained for EGD/Colonoscopy. Patient completed prep. Stools yellowish-clear.

## 2021-09-23 NOTE — Anesthesia Postprocedure Evaluation (Signed)
Anesthesia Post Note  Patient: Carl Sowles Sr.  Procedure(s) Performed: COLONOSCOPY WITH PROPOFOL ENTEROSCOPY GIVENS CAPSULE STUDY HOT HEMOSTASIS (ARGON PLASMA COAGULATION/BICAP)     Patient location during evaluation: Endoscopy Anesthesia Type: MAC Level of consciousness: oriented, awake and alert and awake Pain management: pain level controlled Vital Signs Assessment: post-procedure vital signs reviewed and stable Respiratory status: spontaneous breathing, nonlabored ventilation, respiratory function stable and patient connected to nasal cannula oxygen Cardiovascular status: blood pressure returned to baseline and stable Postop Assessment: no headache, no backache and no apparent nausea or vomiting Anesthetic complications: no   No notable events documented.  Last Vitals:  Vitals:   09/23/21 1235 09/23/21 1939  BP:  (!) 89/72  Pulse:  94  Resp: 16 19  Temp:  36.7 C  SpO2:  100%    Last Pain:  Vitals:   09/23/21 1939  TempSrc: Oral  PainSc:                  Santa Lighter

## 2021-09-23 NOTE — Progress Notes (Addendum)
PROGRESS NOTE    [name].  EUM:[id number] DOB: 01/22/1961 DOA: 09/17/2021 PCP: [name], MD   Brief Narrative:   61 y.o. male with a history of ESRD on HD, CVA, hypertension, diverticulosis, hemorrhoids, ischemic colitis, OSA not on CPAP, GERD, PUD, migraines. Patient presented secondary to recurrent bloody stools. GI and Nephro were consulted. PPI BID was started.  Recent EGD/C-scope that showed clean based nonbleeding gastric and duodenal ulcer with segmental colitis, duodenitis, gastritis and pandiverticulosis. Agreed for 2U PRBC.  But despite of that having bleeding, repeat CTA is negative.  Hemoglobin continues to drift down but refusing PRBC now.  GI planning on endoscopic evaluation.   Assessment & Plan:   Recurrent GI bleed  - Recent EGD/C scope that showed clean based nonbleeding gastric and duodenal ulcer with segmental colitis, duodenitis, gastritis and pandiverticulosis.  Status post 2 unit PRBC transfusion on 09/20/2021, hemoglobin still drifted down.  CTA on 8/10 does not show active bleed.  Discussed with GI, planning on EGD/Cscope. Will benefit from Video Capusle on 09/23/2021, will follow results, supplement IV iron on 5/40/0867 along with folic acid.  He has tolerated IV iron in the hospital before.  Monitor H&H.  According to the patient he is starting to become a Jehovah's Witness.   Hypotension - Chronic hypotension, midodrine if necessary   Acute on chronic anemia - Acute blood loss anemia - Jehovah's Witness, initially did not accept blood products but then agreed to it for lifesaving measures therefore received 2 unit PRBC.  Today refusing further transfusion.  ESRD on HD - Nephrology following.   Hyperkalemia - HD per nephrology   Primary hypertension - Not on any meds   Hyperlipidemia - Lipitor   Left hand numbness - Resolved   OSA on CPAP - Patient is nonadherent as an outpatient     DVT prophylaxis: SCDs Start: 09/18/21 0014 Code Status: Full   Family Communication:    Status is: Inpatient Remains inpatient appropriate because: Low BP during HD, ongoing evaluation for GI bleed   Subjective: Had an episode of large bloody BM after HD today.  Feels ok thereafter, overall weak.  Doesn't want blood transfusion today.   Examination: Constitutional: Not in acute distress Respiratory: Clear to auscultation bilaterally Cardiovascular: Normal sinus rhythm, no rubs Abdomen: Nontender nondistended good bowel sounds Musculoskeletal: No edema noted Skin: No rashes seen Neurologic: CN 2-12 grossly intact.  And nonfocal Psychiatric: Normal judgment and insight. Alert and oriented x 3. Normal mood.      Objective: Vitals:   09/23/21 0605 09/23/21 0812 09/23/21 0958 09/23/21 1014  BP:  (!) 82/56 (!) 102/51 (!) 111/55  Pulse:    (!) 118  Resp:   17 11  Temp:  98.5 F (36.9 C)  98 F (36.7 C)  TempSrc:  Oral  Temporal  SpO2:    96%  Weight: 87.8 kg     Height:        Intake/Output Summary (Last 24 hours) at 09/23/2021 1027 Last data filed at 09/22/2021 2234 Gross per 24 hour  Intake 1940 ml  Output 1801 ml  Net 139 ml    Filed Weights   09/22/21 0437 09/22/21 0701 09/23/21 0605  Weight: 97.5 kg 93.6 kg 87.8 kg     Data Reviewed:   CBC: Recent Labs  Lab 09/20/21 0457 09/21/21 0341 09/22/21 0450 09/22/21 1352 09/23/21 0338  WBC 7.0 8.1 7.5 7.5 9.1  HGB 6.0* 6.7* 6.2* 6.0* 6.1*  HCT 18.2* 20.4* 19.0* 18.0* 18.9*  MCV 90.1 90.3 90.0 89.1 91.7  PLT 205 186 225 203 356   Basic Metabolic Panel: Recent Labs  Lab 09/19/21 1109 09/20/21 0457 09/21/21 0341 09/22/21 0450 09/23/21 0338  NA 136 137 135 133* 136  K 4.7 4.8 4.2 4.8 4.5  CL 97* 94* 96* 95* 97*  CO2 '27 27 29 27 28  '$ GLUCOSE 109* 92 97 87 96  BUN 41* 55* 32* 51* 25*  CREATININE 8.38* 10.12* 6.99* 9.57* 6.63*  CALCIUM 8.6* 8.6* 8.2* 8.2* 9.0  PHOS 4.3  --   --   --   --    GFR: Estimated Creatinine Clearance: 13.4 mL/min (A) (by C-G formula  based on SCr of 6.63 mg/dL (H)). Liver Function Tests: Recent Labs  Lab 09/17/21 1956 09/18/21 0636 09/19/21 1109  AST 17 17  --   ALT 13 11  --   ALKPHOS 71 54  --   BILITOT 0.6 0.6  --   PROT 6.7 6.2*  --   ALBUMIN 2.8* 2.6* 2.4*   No results for input(s): "LIPASE", "AMYLASE" in the last 168 hours. No results for input(s): "AMMONIA" in the last 168 hours. Coagulation Profile: Recent Labs  Lab 09/17/21 2227  INR 1.1   Cardiac Enzymes: No results for input(s): "CKTOTAL", "CKMB", "CKMBINDEX", "TROPONINI" in the last 168 hours. BNP (last 3 results) No results for input(s): "PROBNP" in the last 8760 hours. HbA1C: No results for input(s): "HGBA1C" in the last 72 hours. CBG: No results for input(s): "GLUCAP" in the last 168 hours. Lipid Profile: No results for input(s): "CHOL", "HDL", "LDLCALC", "TRIG", "CHOLHDL", "LDLDIRECT" in the last 72 hours. Thyroid Function Tests: No results for input(s): "TSH", "T4TOTAL", "FREET4", "T3FREE", "THYROIDAB" in the last 72 hours. Anemia Panel: Recent Labs    09/23/21 0748  VITAMINB12 1,482*  FOLATE 38.5  FERRITIN 1,137*  TIBC 214*  IRON 33*  RETICCTPCT 2.7     Scheduled Meds:  [MAR Hold] atorvastatin  10 mg Oral Once per day on Mon Thu   [facility] Hold] calcitRIOL  1.75 mcg Oral Q M,W,F-HD   [MAR Hold] Chlorhexidine Gluconate Cloth  6 each Topical Q0600   [MAR Hold] darbepoetin (ARANESP) injection - DIALYSIS  200 mcg Intravenous Q Mon-HD   folic acid  1 mg Oral Daily   [MAR Hold] pantoprazole  40 mg Oral BID   [MAR Hold] sevelamer carbonate  3,200 mg Oral TID WC   [MAR Hold] sodium chloride flush  10-40 mL Intracatheter Q12H   Continuous Infusions:  [MAR Hold] sodium chloride Stopped (09/18/21 0737)     LOS: 6 days   Time spent= 35 mins    [name], MD [facility]  If 7PM-7AM, please contact night-coverage  09/23/2021, 10:27 AM

## 2021-09-23 NOTE — Anesthesia Preprocedure Evaluation (Addendum)
Anesthesia Evaluation  Patient identified by MRN, date of birth, ID band Patient awake    Reviewed: Allergy & Precautions, NPO status , Patient's Chart, lab work & pertinent test results  History of Anesthesia Complications Negative for: history of anesthetic complications  Airway Mallampati: II  TM Distance: >3 FB Neck ROM: Full    Dental no notable dental hx. (+) Teeth Intact, Dental Advisory Given   Pulmonary sleep apnea , former smoker,    Pulmonary exam normal breath sounds clear to auscultation       Cardiovascular hypertension, + Peripheral Vascular Disease  Normal cardiovascular exam Rhythm:Regular Rate:Normal     Neuro/Psych  Headaches, Seizures -, Well Controlled,  PSYCHIATRIC DISORDERS Anxiety Depression TIACVA, No Residual Symptoms    GI/Hepatic Neg liver ROS, PUD, GERD  Medicated,Suspected GIB   Endo/Other  diabetes, Type 2  Renal/GU Dialysis and ESRFRenal disease (HD M/W/F)  negative genitourinary   Musculoskeletal  (+) Arthritis ,   Abdominal   Peds  Hematology  (+) Blood dyscrasia (Hgb 11.3), anemia , JEHOVAH'S WITNESS  Anesthesia Other Findings Day of surgery medications reviewed with patient.  Reproductive/Obstetrics negative OB ROS                            Anesthesia Physical  Anesthesia Plan  ASA: 4  Anesthesia Plan: MAC   Post-op Pain Management: Minimal or no pain anticipated   Induction:   PONV Risk Score and Plan: 1 and Propofol infusion and Treatment may vary due to age or medical condition  Airway Management Planned: Natural Airway and Nasal Cannula  Additional Equipment: None  Intra-op Plan:   Post-operative Plan:   Informed Consent: I have reviewed the patients History and Physical, chart, labs and discussed the procedure including the risks, benefits and alternatives for the proposed anesthesia with the patient or authorized representative who  has indicated his/her understanding and acceptance.       Plan Discussed with: CRNA  Anesthesia Plan Comments: (Patient will accept blood products if medically necessary.)       Anesthesia Quick Evaluation

## 2021-09-23 NOTE — Progress Notes (Signed)
MEDICATION RELATED CONSULT NOTE - INITIAL   Pharmacy Consult for IV iron Indication: iron-deficiency anemia  Allergies  Allergen Reactions   Infed [Iron Dextran] Other (See Comments)    Decreased BP    Oxycodone Nausea Only   Vicodin [Hydrocodone-Acetaminophen] Other (See Comments)    Decreased BP   Diclofenac Other (See Comments)    Unknown    Patient Measurements: Height: '6\' 2"'$  (188 cm) Weight: 87.8 kg (193 lb 9 oz) IBW/kg (Calculated) : 82.2  Vital Signs: Temp: 98 F (36.7 C) (08/12 1014) Temp Source: Temporal (08/12 1014) BP: 99/59 (08/12 1210) Pulse Rate: 73 (08/12 1210)  Labs:  Hgb 6.1, platelets 232  Fe 33 (low), tsat 15 (low), TIBC 214 (low), ferritin up 1137  Medical History: Past Medical History:  Diagnosis Date   Acute gastric ulcer with hemorrhage    Acute GI bleeding 07/31/2019   Acute pericarditis    Anemia    hx of   Anxiety    situational    Arthritis    on meds   Borderline diabetes    Cataract    bilateral sx   Chronic headaches    Depression    situational    Diverticulitis    ESRD (end stage renal disease) ([place])     On Renal Transplant List," [facility]; MWF" (10/23/2016)   GERD (gastroesophageal reflux disease)    with certain foods   GI bleed    Hypertension    diet controlled   Parathyroid abnormality (HCC)    ectopic parathyroid gland   Presence of arteriovenous fistula for hemodialysis, primary ([place])    RUE PER PT RLE   Refusal of blood product    NO WHOLE BLOOD PROUCTS   Renal cell carcinoma ([place])    s/p hand assisted laparoscopic bilateral nephrectomies 11/29/17, + RCC left   Secondary hyperparathyroidism ([place])    Seizures ([place])    one episode in past, due to" elevated Potassium" 08/02/20- "at least 4 years ago"   Sleep apnea    doesn't use CPAP anymore since weight loss   Stroke [facility])    no residual   Assessment:  61 yr old male with recurrent GI bleeding.  Hgb 7.2 on admit 8/6 > trended down to 6.0 on 8/9 and  received 2 units PRBCs.  Improved to 6.7 >then trended down to 6.0>6.1.  Iron studies low.  S/p small bowel enteroscopy and colonoscopy today.  Gastric erosions with active bleeding treated with APC, gastritis, non-bleeding internal hemorrhoids, diverticulitis in entire colon.    For IV iron replacement. Hx hypotension from Infed recordedin 2012. He sas received Feraheme (07/2019 and 06/2014) and Ferrlecit (06/2016) at [facility] during prior admissions. No reaction noted. Shelia Media, PA-C with Nephrology, reports that he received Venofer without issues at the [facility], most recently in March.  Discussed with patient.  Goal of Therapy:  Replenish iron stores Hgb improvement  Plan:  Ferrlecit 250 mg IV daily x 4 Follow up CBC.  Arty Baumgartner, RPh 09/23/2021,1:52 PM

## 2021-09-23 NOTE — Progress Notes (Signed)
[facility] Progress Note   Subjective:   Planned for endoscopy/colonoscopy today. Says he did not sleep well due to prep. Denies SOB, CP, dizziness, abdominal pain and nausea. Wants to wait to see what endoscopy shows before agreeing to another blood transfusion. Discussed risks.   Objective Vitals:   09/22/21 2345 09/23/21 0345 09/23/21 0605 09/23/21 0812  BP: 96/75 (!) 108/53  (!) 82/56  Pulse: 97 80    Resp: 18 15    Temp: 98.7 F (37.1 C) 98 F (36.7 C)  98.5 F (36.9 C)  TempSrc: Oral Oral  Oral  SpO2: 98% 94%    Weight:   87.8 kg   Height:       Physical Exam General: WDWN male, alert and in NAD Heart: RRR, no murmurs, rubs or gallops Lungs: CTA bilaterally without wheezing, rhonchi or rales Abdomen: Soft, non-distended, +BS Extremities: No edema b/l lower extremities  Additional Objective Labs: Basic Metabolic Panel: Recent Labs  Lab 09/19/21 1109 09/20/21 0457 09/21/21 0341 09/22/21 0450 09/23/21 0338  NA 136   < > 135 133* 136  K 4.7   < > 4.2 4.8 4.5  CL 97*   < > 96* 95* 97*  CO2 27   < > '29 27 28  '$ GLUCOSE 109*   < > 97 87 96  BUN 41*   < > 32* 51* 25*  CREATININE 8.38*   < > 6.99* 9.57* 6.63*  CALCIUM 8.6*   < > 8.2* 8.2* 9.0  PHOS 4.3  --   --   --   --    < > = values in this interval not displayed.   Liver Function Tests: Recent Labs  Lab 09/17/21 1956 09/18/21 0636 09/19/21 1109  AST 17 17  --   ALT 13 11  --   ALKPHOS 71 54  --   BILITOT 0.6 0.6  --   PROT 6.7 6.2*  --   ALBUMIN 2.8* 2.6* 2.4*   No results for input(s): "LIPASE", "AMYLASE" in the last 168 hours. CBC: Recent Labs  Lab 09/20/21 0457 09/21/21 0341 09/22/21 0450 09/22/21 1352 09/23/21 0338  WBC 7.0 8.1 7.5 7.5 9.1  HGB 6.0* 6.7* 6.2* 6.0* 6.1*  HCT 18.2* 20.4* 19.0* 18.0* 18.9*  MCV 90.1 90.3 90.0 89.1 91.7  PLT 205 186 225 203 232   Blood Culture    Component Value Date/Time   SDES BLOOD LEFT HAND 08/01/2015 0624   SPECREQUEST BOTTLES  DRAWN AEROBIC AND ANAEROBIC  5CC 08/01/2015 0624   CULT NO GROWTH 5 DAYS 08/01/2015 0624   REPTSTATUS 08/06/2015 FINAL 08/01/2015 0624    Cardiac Enzymes: No results for input(s): "CKTOTAL", "CKMB", "CKMBINDEX", "TROPONINI" in the last 168 hours. CBG: No results for input(s): "GLUCAP" in the last 168 hours. Iron Studies:  Recent Labs    09/23/21 0748  IRON 33*  TIBC 214*  FERRITIN 1,137*   '@lablastinr3'$ @ Studies/Results: No results found. Medications:  sodium chloride Stopped (09/18/21 0737)    atorvastatin  10 mg Oral Once per day on Mon Thu   calcitRIOL  1.75 mcg Oral Q M,W,F-HD   Chlorhexidine Gluconate Cloth  6 each Topical Q0600   [START ON 09/25/2021] darbepoetin (ARANESP) injection - DIALYSIS  200 mcg Intravenous Q Mon-HD   pantoprazole  40 mg Oral BID   sevelamer carbonate  3,200 mg Oral TID WC   sodium chloride flush  10-40 mL Intracatheter Q12H    Dialysis Orders: MWF [facility]  3h 57mn  400/700   87.5kg  2K/3Ca bath  Hep 3500  AVF RFA - last HD 8/4, post wt 87kg, idwg's low recently - last Hb 9.8 on 8/4, last tsat 32% and ferritin 1435 in July - last mircera 75 ug on 7/26, q2, due next week - calcitriol 1.75 ug tiw po  Assessment/Plan: GI bleed - recurrent, here last week w/ EGF showing nonbleeding GU/ DU's. Jehovahs witness but did accept blood on 8/9 - transfused 2 u prbcs. Declines PRBC today despite education about risks. CTA completed without source of bleeding identified. Endoscopy/colonoscopy today.  ESRD - on HD MWF. Tolerating HD well, next treatment Monday.  Hyperkalemia - Corrected with HD.  Anemia esrd - See above, Hb dropping w/ active GIB. S/p 2U prbcs 8/9. Received ESA early (darbe 100 ug  on 8/7). Increase next ESA dose.  Tsat 15% Ferritin 1137. Iron dextran allergy in chart but has received IV Fe as outpatient. Will order IV Fe with next dialysis.  MBD ckd - CCa and phos in range, continue sevelamer and VDRA  BP/ vol - BP soft, likely worse due to  acute anemia. Not BP lowering meds. Minimal UF with HD.  H/o CVA  [name] Paganini, PA-C 09/23/2021, 9:30 AM  [name] Pager: [phone]

## 2021-09-23 NOTE — Op Note (Signed)
[facility] Patient Name: [name] Procedure Date : 09/23/2021 MRN: [id number] Attending MD: Gerrit Heck , MD Date of Birth: 1960/05/15 CSN: [id number] Age: 61 Admit Type: Inpatient Procedure:                Small bowel enteroscopy Indications:              Acute post hemorrhagic anemia, Hematochezia Providers:                Gerrit Heck, MD, Grace Isaac, RN, William Dalton, Technician Referring MD:              Medicines:                Monitored Anesthesia Care Complications:            No immediate complications. Estimated Blood Loss:     Estimated blood loss was minimal. Procedure:                Pre-Anesthesia Assessment:                           - Prior to the procedure, a History and Physical                            was performed, and patient medications and                            allergies were reviewed. The patient's tolerance of                            previous anesthesia was also reviewed. The risks                            and benefits of the procedure and the sedation                            options and risks were discussed with the patient.                            All questions were answered, and informed consent                            was obtained. Prior Anticoagulants: The patient has                            taken no previous anticoagulant or antiplatelet                            agents. ASA Grade Assessment: IV - A patient with                            severe systemic disease that is a constant threat  to life. After reviewing the risks and benefits,                            the patient was deemed in satisfactory condition to                            undergo the procedure.                           After obtaining informed consent, the endoscope was                            passed under direct vision. Throughout the                             procedure, the patient's blood pressure, pulse, and                            oxygen saturations were monitored continuously. The                            [id number]-[id number] ([id number]) Olympus peds colonoscope was                            introduced through the mouth and advanced to the                            fourth part of duodenum. The [id number]-[id number] ([id number])                            Olympus endoscope was introduced through the and                            advanced to the. The small bowel enteroscopy was                            accomplished without difficulty. The patient                            tolerated the procedure well. Scope In: Scope Out: Findings:      The examined esophagus was normal.      A few erosions with 2 sites of active, mild oozing were found in the       gastric fundus. Coagulation for hemostasis using argon plasma was       successful. Estimated blood loss was minimal.      Localized mild inflammation characterized by erythema and linear       erosions was found at the incisura and in the gastric antrum. This       overall appeared much improved compared with the previous study.       Similarly, the gastric body was much improved as well.      Erythematous, edematous mucosa with small erosions without active       bleeding and with no stigmata of bleeding was found in the duodenal bulb  and in the first portion of the duodenum.      There was no evidence of significant pathology in the second portion of       the duodenum, in the third portion of the duodenum and in the fourth       portion of the duodenum. The enteroscope was then removed and replaced       with the endoscope. Using the endoscope, the video capsule enteroscope       was advanced into the duodenal bulb and deployed. Impression:               - Normal esophagus.                           - Gastric erosions with active bleeding. Treated                            with argon plasma  coagulation (APC).                           - Gastritis.                           - Erythematous duodenopathy.                           - Normal second portion of the duodenum, third                            portion of the duodenum and fourth portion of the                            duodenum.                           - Successful completion of the Video Capsule                            Enteroscope placement.                           - No specimens collected. Recommendation:           - NPO x2 hours, then start clears and slowly                            advance diet as tolerated per VCE placement                            protocol.                           - Repeat CBC later today and in AM.                           - Recommend IV iron.                           - Perform a colonoscopy today.                           -  Continue present medications.                           - Will follow-up on results of VCE. Procedure Code(s):        --- Professional ---                           [phone], Small intestinal endoscopy, enteroscopy                            beyond second portion of duodenum, not including                            ileum; with control of bleeding (eg, injection,                            bipolar cautery, unipolar cautery, laser, heater                            probe, stapler, plasma coagulator) Diagnosis Code(s):        --- Professional ---                           K25.4, Chronic or unspecified gastric ulcer with                            hemorrhage                           K29.70, Gastritis, unspecified, without bleeding                           K31.89, Other diseases of stomach and duodenum                           D62, Acute posthemorrhagic anemia                           K92.1, Melena (includes Hematochezia) CPT copyright 2019 [employer]. All rights reserved. The codes documented in this report are preliminary and upon coder  review may  be revised to meet current compliance requirements. Gerrit Heck, MD 09/23/2021 12:01:27 PM Number of Addenda: 0

## 2021-09-24 ENCOUNTER — Encounter (HOSPITAL_COMMUNITY): Payer: Self-pay | Admitting: Gastroenterology

## 2021-09-24 DIAGNOSIS — K625 Hemorrhage of anus and rectum: Secondary | ICD-10-CM | POA: Diagnosis not present

## 2021-09-24 DIAGNOSIS — E875 Hyperkalemia: Secondary | ICD-10-CM | POA: Diagnosis not present

## 2021-09-24 DIAGNOSIS — D649 Anemia, unspecified: Secondary | ICD-10-CM | POA: Diagnosis not present

## 2021-09-24 DIAGNOSIS — K2980 Duodenitis without bleeding: Secondary | ICD-10-CM

## 2021-09-24 DIAGNOSIS — D62 Acute posthemorrhagic anemia: Secondary | ICD-10-CM | POA: Diagnosis not present

## 2021-09-24 LAB — BASIC METABOLIC PANEL
Anion gap: 11 (ref 5–15)
BUN: 31 mg/dL — ABNORMAL HIGH (ref 8–23)
CO2: 27 mmol/L (ref 22–32)
Calcium: 8.4 mg/dL — ABNORMAL LOW (ref 8.9–10.3)
Chloride: 98 mmol/L (ref 98–111)
Creatinine, Ser: 8.93 mg/dL — ABNORMAL HIGH (ref 0.61–1.24)
GFR, Estimated: 6 mL/min — ABNORMAL LOW (ref >60)
Glucose, Bld: 89 mg/dL (ref 70–99)
Potassium: 4.6 mmol/L (ref 3.5–5.1)
Sodium: 136 mmol/L (ref 135–145)

## 2021-09-24 LAB — CBC
HCT: 15.3 % — ABNORMAL LOW (ref 39.0–52.0)
HCT: 16.1 % — ABNORMAL LOW (ref 39.0–52.0)
Hemoglobin: 5 g/dL — CL (ref 13.0–17.0)
Hemoglobin: 5.2 g/dL — CL (ref 13.0–17.0)
MCH: 29.2 pg (ref 26.0–34.0)
MCH: 29.7 pg (ref 26.0–34.0)
MCHC: 32.7 g/dL (ref 30.0–36.0)
MCHC: 32.3 g/dL (ref 30.0–36.0)
MCV: 89.5 fL (ref 80.0–100.0)
MCV: 92 fL (ref 80.0–100.0)
Platelets: 212 10*3/uL (ref 150–400)
Platelets: 242 10*3/uL (ref 150–400)
RBC: 1.71 MIL/uL — ABNORMAL LOW (ref 4.22–5.81)
RBC: 1.75 MIL/uL — ABNORMAL LOW (ref 4.22–5.81)
RDW: 17.2 % — ABNORMAL HIGH (ref 11.5–15.5)
RDW: 17.5 % — ABNORMAL HIGH (ref 11.5–15.5)
WBC: 5.5 10*3/uL (ref 4.0–10.5)
WBC: 6 10*3/uL (ref 4.0–10.5)
nRBC: 0 % (ref 0.0–0.2)
nRBC: 0 % (ref 0.0–0.2)

## 2021-09-24 LAB — PREPARE RBC (CROSSMATCH)

## 2021-09-24 MED ORDER — CHLORHEXIDINE GLUCONATE CLOTH 2 % EX PADS: 6.0000 | MEDICATED_PAD | Freq: Every day | CUTANEOUS | Status: DC

## 2021-09-24 MED ORDER — BETAMETHASONE DIPROPIONATE AUG 0.05 % EX OINT
1.0000 | TOPICAL_OINTMENT | Freq: Two times a day (BID) | CUTANEOUS | Status: DC | PRN
Start: 2021-09-24 — End: 2021-09-24

## 2021-09-24 MED ORDER — SODIUM CHLORIDE 0.9% IV SOLUTION: Freq: Once | INTRAVENOUS | Status: DC

## 2021-09-24 NOTE — Progress Notes (Signed)
Daily Progress Note   Patient Name: Carl Maffeo Sr.       Date: 09/24/2021 DOB: 11-27-1960  Age: 61 y.o. MRN#: [id number] Attending Physician: Thurnell Lose, MD Primary Care Physician: [name], MD Admit Date: 09/17/2021  Reason for Consultation/Follow-up: Establishing goals of care  Subjective: Medical records reviewed including progress notes, labs, imaging. Patient assessed at the bedside.  He is sitting in bedside chair no acute distress.  Created space and opportunity for patient's thoughts and feelings on his current illness.  We discussed CODE STATUS and current plan of care.  He confirms that he wishes to remain a full code and understands the risks of his decisions to decline/defer blood transfusions.  He shares with me that he will make his decision based on his follow-up hemoglobin level.  He has been looking at the advance directive paperwork and wishes to continue reflecting.  We will let PMT know if he is interested in completing this admission.  Questions and concerns addressed. PMT will continue to support holistically.   Length of Stay: 7   Physical Exam Vitals reviewed.  Constitutional:      General: He is not in acute distress. Cardiovascular:     Rate and Rhythm: Normal rate.  Pulmonary:     Effort: Pulmonary effort is normal. No respiratory distress.  Neurological:     Mental Status: He is alert and oriented to person, place, and time.  Psychiatric:        Mood and Affect: Mood normal.        Behavior: Behavior normal.        Cognition and Memory: Cognition normal.        Judgment: Judgment normal.             Vital Signs: BP 105/67 (BP Location: Right Leg)   Pulse 78   Temp 98.4 F (36.9 C) (Oral)   Resp 13   Ht '6\' 2"'$  (1.88 m)   Wt 89.4 kg    SpO2 98%   BMI 25.31 kg/m  SpO2: SpO2: 98 % O2 Device: O2 Device: Room Air O2 Flow Rate: O2 Flow Rate (L/min): 2 L/min      Palliative Assessment/Data: 60 to 70%    Palliative Care Assessment & Plan   Patient Profile: 61 y.o. male  with past medical history  of GI bleeding with refusal of blood products; CVA,ESRD on MWF HD; HTN;diverticulosis,hemorrhoids, and OSA not on CPAP,depression,GERD,PUD, migraines admitted on 09/17/2021 with recurrent GI bleeding after recent admission from 7/31 to 8/2.     Patient is a Sales promotion account executive Witness and has been refusing any further transfusions of blood after receiving 2 units on 8/9 PMT has been consulted to assist with goals of care conversation.    Assessment: Goals of care conversation Recurrent GI bleed Acute on chronic anemia ESRD on HD  Recommendations/Plan: Continue full code/full scope treatment Goal remains to prolong life as long as possible, patient prefers to avoid blood transfusions but may consider in severe life-threatening instances as he is not yet baptized as a Jehovah's Witness Patient will call PMT if he wishes to further discuss goals of care or advance care planning documentation PMT will continue to follow peripherally and patient was encouraged to call if needed.  Please secure chat or call team line with urgent needs   Prognosis:  Unable to determine  Discharge Planning: To Be Determined  Care plan was discussed with patient   MDM high         Yelina Sarratt Johnnette Litter, PA-C  Palliative Medicine Team Team phone # [phone]  Thank you for allowing the Palliative Medicine Team to assist in the care of this patient. Please utilize secure chat with additional questions, if there is no response within 30 minutes please call the above phone number.  Palliative Medicine Team providers are available by phone from 7am to 7pm daily and can be reached through the team cell phone.  Should this patient require assistance  outside of these hours, please call the patient's attending physician.

## 2021-09-24 NOTE — Procedures (Signed)
Procedure: Video Capsule Endoscopy Indication: Hematochezia, acute blood loss anemia  61 year old male with history of ESRD on HD, CVA, HTN, diverticulosis, OSA admitted with hematochezia and acute blood loss anemia.  Initial EGD on 09/13/2021 with clean base nonbleeding gastric and duodenal ulcers, gastritis/duodenitis and treated with high-dose PPI.  Colonoscopy on 09/13/2021 with ulcerated mucosa in transverse colon c/w ischemic colitis.  Was treated with 2 unit PRBC transfusion.  Hematochezia recurred and was again evaluated with enteroscopy 09/23/2021 with focal small erosions in the gastric fundus treated with APC, otherwise improving gastritis/duodenitis.  Colonoscopy colonoscopy on 09/23/2021 with focal ischemic colitis in transverse colon which was improved compared with the prior study, healthy-appearing ileocolonic anastomosis, pandiverticulosis without bleeding, internal hemorrhoids.  Video Capsule Endoscopy placed endoscopically.  Findings: 1) complete study with adequate bowel prep 2) endoscopically placed video capsule with first duodenal image at 5 minutes 37 seconds 3) first colonic image at 3 hours 41 minutes 4) aside from mild duodenal erythema limited to the duodenal bulb, normal study.  No blood in the GI lumen   Impression and Recommendations: Normal video capsule endoscopy.  No plan for device assisted enteroscopy  - Continue trending CBCs.  Patient declining PRBC transfusion due to religious reasons - Received IV iron yesterday - Inpatient GI service will continue to follow  Gerrit Heck, DO, [facility]

## 2021-09-24 NOTE — Progress Notes (Signed)
TRH night cross cover note:  Patient with ESRD on HD, student Jehovah witness, here with recurrent GIB, acute on chronic anemia. I followed-up on result of this evening's CBC: Hgb 5.2, compared to 5.0 this morning.   Plan established by the day hospitalist and subspecialist consultation, including discussions with hematology and palliative care consultation for goals of care clarification, including clarification of pt's amenability to blood transfusion, was for the patient to receive 1 unit prbc this evening if Hgb remained less than or equal to 5.5, followed by tranfusion of 2nd unit prbc tomorrow (8/14) with HD. Patient was amenable to this plan and consented to prbc transfusion, as above.   I ordered transfusion of 1 unit prbc for this evening based upon the updated Hgb level of 5.2.    However, with hgb trending up slightly in the absence of interval prbc transfusion, patient now refusing prbc transfusion this evening, conveying that he wants to re-evaluate via result of hgb level with tomorrow morning's labs, without interval hgb check. Most recent VS notable for BP 160/90, HR 107.   I confirmed existing order for CBC tomorrow AM.      Babs Bertin, DO Hospitalist

## 2021-09-24 NOTE — Progress Notes (Signed)
Patient's recent lab results discussed with patient as he inquired. He is ordered to receive a unit of PRBC's. Patient declines the transfusion. States that he wants to see how his labs are tomorrow. Denies lightheadedness. Dr. Velia Meyer notified via secure chat.

## 2021-09-24 NOTE — Progress Notes (Signed)
TRH night cross cover note:   I was notified by RN of patient's request for resumption of home topical ( augmented betamethasone dipropionate 0.05 % ointment), which applies bid prn for rash. I resumed this home topical.      Babs Bertin, DO Hospitalist

## 2021-09-24 NOTE — Progress Notes (Addendum)
PROGRESS NOTE    Carl Combes Sr.  JSE:[id number] DOB: [birth year] DOA: 09/17/2021 PCP: [name], MD   Brief Narrative:   61 y.o. male with a history of ESRD on HD, CVA, hypertension, diverticulosis, hemorrhoids, ischemic colitis, OSA not on CPAP, GERD, PUD, migraines. Patient presented secondary to recurrent bloody stools. GI and Nephro were consulted. PPI BID was started.  Recent EGD/C-scope that showed clean based nonbleeding gastric and duodenal ulcer with segmental colitis, duodenitis, gastritis and pandiverticulosis. Agreed for 2U PRBC.  But despite of that having bleeding, repeat CTA is negative.  Hemoglobin continues to drift down but refusing PRBC now.  GI planning on endoscopic evaluation.   Assessment & Plan:   Recurrent GI bleed  - Recent EGD/C scope that showed clean based nonbleeding gastric and duodenal ulcer with segmental colitis, duodenitis, gastritis and pandiverticulosis.  Status post 2 unit PRBC transfusion on 09/20/2021, hemoglobin still drifted down.  CTA on 8/10 does not show active bleed.  Is discussed with GI on 09/23/2021, EGD and colonoscopy were both negative for active bleeding but did have some nonspecific changes, Stool has been introduced to his small bowel await results.  He has received IV iron on 09/23/2021, already getting Aranesp with dialysis treatments.  Of note patient is a Dispensing optician witness and has agreed to take 2 more units of packed RBCs if needed after a repeat draw on 09/24/2021, plan was also discussed with hematologist Dr. Alen Blew morning of 09/24/2021.   Hypotension - Chronic hypotension, midodrine if necessary   Acute on chronic anemia - Acute blood loss anemia -  see plan above  ESRD on HD - Nephrology following. MWF HD.   Hyperkalemia - HD per nephrology   Primary hypertension - Not on any meds   Hyperlipidemia - Lipitor   Left hand numbness - Resolved   OSA on CPAP - Patient is nonadherent as an outpatient     DVT prophylaxis:  SCDs Start: 09/18/21 0014 Code Status: Full  Family Communication:    Status is: Inpatient Remains inpatient appropriate because: Low BP during HD, ongoing evaluation for GI bleed   Subjective:  Patient in bed, appears comfortable, denies any headache, no fever, no chest pain or pressure, no shortness of breath , no abdominal pain. No new focal weakness.   Examination:  Awake Alert, No new F.N deficits, Normal affect [place].AT,PERRAL Supple Neck, No JVD,   Symmetrical Chest wall movement, Good air movement bilaterally, CTAB RRR,No Gallops, Rubs or new Murmurs,  +ve B.Sounds, Abd Soft, No tenderness,   No Cyanosis, Clubbing or edema   Objective: Vitals:   09/24/21 0329 09/24/21 0441 09/24/21 0603 09/24/21 0747  BP: (!) 104/55   113/76  Pulse: 93   (!) 114  Resp: 16   (!) 21  Temp: 98.7 F (37.1 C)   98.4 F (36.9 C)  TempSrc: Oral   Oral  SpO2: 98%     Weight:  91.3 kg 89.4 kg   Height:        Intake/Output Summary (Last 24 hours) at 09/24/2021 1039 Last data filed at 09/23/2021 2111 Gross per 24 hour  Intake 380 ml  Output --  Net 380 ml    Filed Weights   09/23/21 0605 09/24/21 0441 09/24/21 0603  Weight: 87.8 kg 91.3 kg 89.4 kg     Data Reviewed:   Recent Labs  Lab 09/21/21 0341 09/22/21 0450 09/22/21 1352 09/23/21 0338 09/24/21 0403  WBC 8.1 7.5 7.5 9.1 5.5  HGB 6.7* 6.2* 6.0*  6.1* 5.0*  HCT 20.4* 19.0* 18.0* 18.9* 15.3*  PLT 186 225 203 232 212  MCV 90.3 90.0 89.1 91.7 89.5  MCH 29.6 29.4 29.7 29.6 29.2  MCHC 32.8 32.6 33.3 32.3 32.7  RDW 17.0* 17.2* 17.2* 17.2* 17.2*    Recent Labs  Lab 09/17/21 1956 09/17/21 2227 09/18/21 0636 09/19/21 1109 09/20/21 0457 09/21/21 0341 09/22/21 0450 09/23/21 0338 09/24/21 0403  NA 137  --  139 136 137 135 133* 136 136  K 6.2*  --  6.4* 4.7 4.8 4.2 4.8 4.5 4.6  CL 105  --  109 97* 94* 96* 95* 97* 98  CO2 19*  --  20* '27 27 29 27 28 27  '$ GLUCOSE 97  --  78 109* 92 97 87 96 89  BUN 65*  --  75* 41* 55*  32* 51* 25* 31*  CREATININE 11.25*  --  11.90* 8.38* 10.12* 6.99* 9.57* 6.63* 8.93*  CALCIUM 8.7*  --  8.6* 8.6* 8.6* 8.2* 8.2* 9.0 8.4*  AST 17  --  17  --   --   --   --   --   --   ALT 13  --  11  --   --   --   --   --   --   ALKPHOS 71  --  54  --   --   --   --   --   --   BILITOT 0.6  --  0.6  --   --   --   --   --   --   ALBUMIN 2.8*  --  2.6* 2.4*  --   --   --   --   --   PHOS  --   --   --  4.3  --   --   --   --   --   INR  --  1.1  --   --   --   --   --   --   --    Anemia Panel: Recent Labs    09/23/21 0748  VITAMINB12 1,482*  FOLATE 38.5  FERRITIN 1,137*  TIBC 214*  IRON 33*  RETICCTPCT 2.7     Scheduled Meds:  atorvastatin  10 mg Oral Once per day on Mon Thu   calcitRIOL  1.75 mcg Oral Q M,W,F-HD   Chlorhexidine Gluconate Cloth  6 each Topical Q0600   [START ON 09/25/2021] darbepoetin (ARANESP) injection - DIALYSIS  200 mcg Intravenous Q Mon-HD   folic acid  1 mg Oral Daily   pantoprazole  40 mg Oral BID   sevelamer carbonate  3,200 mg Oral TID WC   sodium chloride flush  10-40 mL Intracatheter Q12H   Continuous Infusions:  ferric gluconate (FERRLECIT) IVPB 250 mg (09/24/21 0937)   sodium chloride Stopped (09/18/21 0737)     LOS: 7 days   Time spent= 35 mins  [name], MD [facility]  If 7PM-7AM, please contact night-coverage  09/24/2021, 10:39 AM

## 2021-09-24 NOTE — Progress Notes (Signed)
[facility] Progress Note   Subjective:   Hb down to 5 this AM. Pt reports he wants to wait for repeat Hb this afternoon before agreeing to transfusion. Denies SOB, CP, dizziness, palpitations, and weakness.   Objective Vitals:   09/24/21 0329 09/24/21 0441 09/24/21 0603 09/24/21 0747  BP: (!) 104/55   113/76  Pulse: 93   (!) 114  Resp: 16   (!) 21  Temp: 98.7 F (37.1 C)   98.4 F (36.9 C)  TempSrc: Oral   Oral  SpO2: 98%     Weight:  91.3 kg 89.4 kg   Height:       Physical Exam General: WDWN male, alert and in NAD Heart: RRR, no murmurs, rubs or gallops Lungs: CTA bilaterally without wheezing, rhonchi or rales Abdomen: Soft, non-distended, +BS Extremities: No edema b/l lower extremities  Additional Objective Labs: Basic Metabolic Panel: Recent Labs  Lab 09/19/21 1109 09/20/21 0457 09/22/21 0450 09/23/21 0338 09/24/21 0403  NA 136   < > 133* 136 136  K 4.7   < > 4.8 4.5 4.6  CL 97*   < > 95* 97* 98  CO2 27   < > '27 28 27  '$ GLUCOSE 109*   < > 87 96 89  BUN 41*   < > 51* 25* 31*  CREATININE 8.38*   < > 9.57* 6.63* 8.93*  CALCIUM 8.6*   < > 8.2* 9.0 8.4*  PHOS 4.3  --   --   --   --    < > = values in this interval not displayed.   Liver Function Tests: Recent Labs  Lab 09/17/21 1956 09/18/21 0636 09/19/21 1109  AST 17 17  --   ALT 13 11  --   ALKPHOS 71 54  --   BILITOT 0.6 0.6  --   PROT 6.7 6.2*  --   ALBUMIN 2.8* 2.6* 2.4*   No results for input(s): "LIPASE", "AMYLASE" in the last 168 hours. CBC: Recent Labs  Lab 09/21/21 0341 09/22/21 0450 09/22/21 1352 09/23/21 0338 09/24/21 0403  WBC 8.1 7.5 7.5 9.1 5.5  HGB 6.7* 6.2* 6.0* 6.1* 5.0*  HCT 20.4* 19.0* 18.0* 18.9* 15.3*  MCV 90.3 90.0 89.1 91.7 89.5  PLT 186 225 203 232 212   Blood Culture    Component Value Date/Time   SDES BLOOD LEFT HAND 08/01/2015 0624   SPECREQUEST BOTTLES DRAWN AEROBIC AND ANAEROBIC  5CC 08/01/2015 0624   CULT NO GROWTH 5 DAYS 08/01/2015 0624    REPTSTATUS 08/06/2015 FINAL 08/01/2015 0624    Cardiac Enzymes: No results for input(s): "CKTOTAL", "CKMB", "CKMBINDEX", "TROPONINI" in the last 168 hours. CBG: No results for input(s): "GLUCAP" in the last 168 hours. Iron Studies:  Recent Labs    09/23/21 0748  IRON 33*  TIBC 214*  FERRITIN 1,137*   '@lablastinr3'$ @ Studies/Results: No results found. Medications:  ferric gluconate (FERRLECIT) IVPB 250 mg (09/23/21 1548)   sodium chloride Stopped (09/18/21 0737)    atorvastatin  10 mg Oral Once per day on Mon Thu   calcitRIOL  1.75 mcg Oral Q M,W,F-HD   Chlorhexidine Gluconate Cloth  6 each Topical Q0600   [START ON 09/25/2021] darbepoetin (ARANESP) injection - DIALYSIS  200 mcg Intravenous Q Mon-HD   folic acid  1 mg Oral Daily   pantoprazole  40 mg Oral BID   sevelamer carbonate  3,200 mg Oral TID WC   sodium chloride flush  10-40 mL Intracatheter Q12H  Dialysis Orders: MWF [facility]  3h 9mn    400/700   87.5kg  2K/3Ca bath  Hep 3500  AVF RFA - last HD 8/4, post wt 87kg, idwg's low recently - last Hb 9.8 on 8/4, last tsat 32% and ferritin 1435 in July - last mircera 75 ug on 7/26, q2, due next week - calcitriol 1.75 ug tiw po  Assessment/Plan: GI bleed - recurrent, here last week w/ EGF showing nonbleeding GU/ DU's. Jehovahs witness but did accept blood on 8/9 - transfused 2 u prbcs. Declines PRBC today despite education about risks. CTA completed without source of bleeding identified. S/p EGD and colonoscopy.  ESRD - on HD MWF. Tolerating HD well, next treatment Monday.  Hyperkalemia - Corrected with HD.  Anemia esrd - See above, Hb dropping w/ active GIB. S/p 2U prbcs 8/9. Received ESA early (darbe 100 ug  on 8/7). Increase next ESA dose.  Tsat 15% Ferritin 1137. Iron dextran allergy in chart but has received IV Fe as outpatient. Received dose of ferrlecit yesterday.  MBD ckd - CCa and phos in range, continue sevelamer and VDRA  BP/ vol - BP stable. Not BP lowering meds.  Minimal UF with HD.  H/o CVA     SAnice Paganini PA-C 09/24/2021, 9:06 AM  [facility] Pager: [phone]

## 2021-09-25 DIAGNOSIS — D649 Anemia, unspecified: Secondary | ICD-10-CM | POA: Diagnosis not present

## 2021-09-25 DIAGNOSIS — E875 Hyperkalemia: Secondary | ICD-10-CM | POA: Diagnosis not present

## 2021-09-25 DIAGNOSIS — D62 Acute posthemorrhagic anemia: Secondary | ICD-10-CM | POA: Diagnosis not present

## 2021-09-25 DIAGNOSIS — K625 Hemorrhage of anus and rectum: Secondary | ICD-10-CM | POA: Diagnosis not present

## 2021-09-25 LAB — CBC
HCT: 17 % — ABNORMAL LOW (ref 39.0–52.0)
Hemoglobin: 5.3 g/dL — CL (ref 13.0–17.0)
MCH: 29 pg (ref 26.0–34.0)
MCHC: 31.2 g/dL (ref 30.0–36.0)
MCV: 92.9 fL (ref 80.0–100.0)
Platelets: 260 10*3/uL (ref 150–400)
RBC: 1.83 MIL/uL — ABNORMAL LOW (ref 4.22–5.81)
RDW: 17.6 % — ABNORMAL HIGH (ref 11.5–15.5)
WBC: 7.3 10*3/uL (ref 4.0–10.5)
nRBC: 0 % (ref 0.0–0.2)

## 2021-09-25 LAB — BASIC METABOLIC PANEL
Anion gap: 14 (ref 5–15)
BUN: 48 mg/dL — ABNORMAL HIGH (ref 8–23)
CO2: 24 mmol/L (ref 22–32)
Calcium: 8.5 mg/dL — ABNORMAL LOW (ref 8.9–10.3)
Chloride: 98 mmol/L (ref 98–111)
Creatinine, Ser: 11.22 mg/dL — ABNORMAL HIGH (ref 0.61–1.24)
GFR, Estimated: 5 mL/min — ABNORMAL LOW (ref >60)
Glucose, Bld: 89 mg/dL (ref 70–99)
Potassium: 5.2 mmol/L — ABNORMAL HIGH (ref 3.5–5.1)
Sodium: 136 mmol/L (ref 135–145)

## 2021-09-25 MED ORDER — ALTEPLASE 2 MG IJ SOLR: 2.0000 mg | Freq: Once | INTRAMUSCULAR | Status: DC | PRN

## 2021-09-25 MED ORDER — DARBEPOETIN ALFA 200 MCG/0.4ML IJ SOSY
200.0000 ug | PREFILLED_SYRINGE | INTRAMUSCULAR | Status: DC
  Administered 2021-09-25: 200 ug via INTRAVENOUS
  Filled 2021-09-25: qty 0.4

## 2021-09-25 MED ORDER — ANTICOAGULANT SODIUM CITRATE 4% (200MG/5ML) IV SOLN: 5.0000 mL | Status: DC | PRN

## 2021-09-25 MED ORDER — GABAPENTIN 100 MG PO CAPS
100.0000 mg | ORAL_CAPSULE | Freq: Every day | ORAL | Status: DC
Start: 2021-09-25 — End: 2021-09-29
  Administered 2021-09-25 – 2021-09-28 (×4): 100 mg via ORAL
  Filled 2021-09-25 (×4): qty 1

## 2021-09-25 MED ORDER — DARBEPOETIN ALFA 200 MCG/0.4ML IJ SOSY
PREFILLED_SYRINGE | INTRAMUSCULAR | Status: AC
  Filled 2021-09-25: qty 0.4

## 2021-09-25 NOTE — Care Management Important Message (Signed)
Important Message  Patient Details  Name: Carl Scullion Sr. MRN: [id number] Date of Birth: 23-Dec-1960   Medicare Important Message Given:  Yes     Jlyn Bracamonte 09/25/2021, 3:02 PM

## 2021-09-25 NOTE — Procedures (Signed)
HD Note  Patient's Aranesp was given.  The first dose that was taken out of the pyxis fell to the floor and had to be thrown away.  The record shows that two doses were removed.

## 2021-09-25 NOTE — Progress Notes (Signed)
[name] Progress Note   Subjective:   Patient seen and examined on HD. Hb 5.3 this am. No complaints currently, feels well, tolerating treatment. UFG 2.5L today.   Objective Vitals:   09/25/21 0900 09/25/21 0930 09/25/21 1000 09/25/21 1030  BP: 124/65 107/64 113/64 107/65  Pulse: 74 85 77 83  Resp: '12 16 13 12  '$ Temp:      TempSrc:      SpO2: 100% 100% 100% 100%  Weight:      Height:       Physical Exam General: NAD Heart: RRR Lungs: CTA bilaterally Abdomen: Soft, non-distended, +BS Extremities: No edema b/l lower extremities Dialysis access: RUE AVF +b/t  Additional Objective Labs: Basic Metabolic Panel: Recent Labs  Lab 09/19/21 1109 09/20/21 0457 09/23/21 0338 09/24/21 0403 09/25/21 0410  NA 136   < > 136 136 136  K 4.7   < > 4.5 4.6 5.2*  CL 97*   < > 97* 98 98  CO2 27   < > '28 27 24  '$ GLUCOSE 109*   < > 96 89 89  BUN 41*   < > 25* 31* 48*  CREATININE 8.38*   < > 6.63* 8.93* 11.22*  CALCIUM 8.6*   < > 9.0 8.4* 8.5*  PHOS 4.3  --   --   --   --    < > = values in this interval not displayed.   Liver Function Tests: Recent Labs  Lab 09/19/21 1109  ALBUMIN 2.4*   No results for input(s): "LIPASE", "AMYLASE" in the last 168 hours. CBC: Recent Labs  Lab 09/22/21 1352 09/23/21 0338 09/24/21 0403 09/24/21 1838 09/25/21 0410  WBC 7.5 9.1 5.5 6.0 7.3  HGB 6.0* 6.1* 5.0* 5.2* 5.3*  HCT 18.0* 18.9* 15.3* 16.1* 17.0*  MCV 89.1 91.7 89.5 92.0 92.9  PLT 203 232 212 242 260   Blood Culture    Component Value Date/Time   SDES BLOOD LEFT HAND 08/01/2015 0624   SPECREQUEST BOTTLES DRAWN AEROBIC AND ANAEROBIC  5CC 08/01/2015 0624   CULT NO GROWTH 5 DAYS 08/01/2015 0624   REPTSTATUS 08/06/2015 FINAL 08/01/2015 0624    Cardiac Enzymes: No results for input(s): "CKTOTAL", "CKMB", "CKMBINDEX", "TROPONINI" in the last 168 hours. CBG: No results for input(s): "GLUCAP" in the last 168 hours. Iron Studies:  Recent Labs    09/23/21 0748   IRON 33*  TIBC 214*  FERRITIN 1,137*   '@lablastinr3'$ @ Studies/Results: No results found. Medications:  anticoagulant sodium citrate     ferric gluconate (FERRLECIT) IVPB 250 mg (09/24/21 4098)   sodium chloride Stopped (09/18/21 0737)    sodium chloride   Intravenous Once   atorvastatin  10 mg Oral Once per day on Mon Thu   calcitRIOL  1.75 mcg Oral Q M,W,F-HD   Chlorhexidine Gluconate Cloth  6 each Topical Q0600   Darbepoetin Alfa       darbepoetin (ARANESP) injection - DIALYSIS  200 mcg Intravenous Q Mon-HD   folic acid  1 mg Oral Daily   pantoprazole  40 mg Oral BID   sevelamer carbonate  3,200 mg Oral TID WC   sodium chloride flush  10-40 mL Intracatheter Q12H    Dialysis Orders: MWF [facility]  3h 54mn    400/700   87.5kg  2K/3Ca bath  Hep 3500  AVF RFA - last HD 8/4, post wt 87kg, idwg's low recently - last Hb 9.8 on 8/4, last tsat 32% and ferritin 1435 in July - last mircera  75 ug on 7/26, q2, due next week - calcitriol 1.75 ug tiw po  Assessment/Plan: GI bleed - recurrent, here last week w/ EGD showing nonbleeding GU/ DU's. Jehovahs witness but did accept blood on 8/9 - transfused 2 u prbcs. Declines PRBC today despite education about risks. CTA completed without source of bleeding identified. S/p EGD and colonoscopy.  ESRD - on HD MWF. will c/w MWF schedule Hyperkalemia - Corrected with HD.  Anemia esrd - See above, Hb dropping w/ active GIB. S/p 2U prbcs 8/9. Received ESA early (darbe 100 ug  on 8/7). Increased ESA dose to 200 on 8/14.  Tsat 15% Ferritin 1137. Iron dextran allergy in chart but has received IV Fe as outpatient. Received dose of ferrlecit 8/12 MBD ckd - CCa and phos in range, continue sevelamer and VDRA  BP/ vol - BP stable. Not BP lowering meds. Minimal UF with HD.  H/o CVA

## 2021-09-25 NOTE — Progress Notes (Signed)
Daily Rounding Note  09/25/2021, 1:43 PM  LOS: 8 days   SUBJECTIVE:   Chief complaint:   recurrent GIB and blood loss anemia, acute on chronic   Feels well.  Went to dialysis earlier today.  Stools for the past 2 and possibly 3 days are brown, not melenic or bloody.  OBJECTIVE:         Vital signs in last 24 hours:    Temp:  [97.6 F (36.4 C)-98.7 F (37.1 C)] 97.6 F (36.4 C) (08/14 0827) Pulse Rate:  [74-105] 100 (08/14 1236) Resp:  [12-23] 19 (08/14 1236) BP: (91-161)/(48-90) 95/48 (08/14 1236) SpO2:  [97 %-100 %] 100 % (08/14 1236) Weight:  [90.1 kg-92 kg] 90.1 kg (08/14 1246) Last BM Date : 09/23/21 Filed Weights   09/25/21 0827 09/25/21 0832 09/25/21 1246  Weight: 92 kg 92 kg 90.1 kg   General: NAD.  Looks well.  Alert.  Comfortable Heart: RRR. Chest: No labored breathing or cough Abdomen: Soft without tenderness or distention.  Active bowel sounds Extremities: No CCE. Neuro/Psych: Pleasant, calm per usual.  No confusion.  No involuntary movements.  No gross deficits/weakness  Intake/Output from previous day: 08/13 0701 - 08/14 0700 In: 660 [P.O.:120; IV Piggyback:540] Out: 2 [Urine:1; Stool:1]  Intake/Output this shift: Total I/O In: -  Out: 2000 [Other:2000]  Lab Results: Recent Labs    09/24/21 0403 09/24/21 1838 09/25/21 0410  WBC 5.5 6.0 7.3  HGB 5.0* 5.2* 5.3*  HCT 15.3* 16.1* 17.0*  PLT 212 242 260   BMET Recent Labs    09/23/21 0338 09/24/21 0403 09/25/21 0410  NA 136 136 136  K 4.5 4.6 5.2*  CL 97* 98 98  CO2 '28 27 24  '$ GLUCOSE 96 89 89  BUN 25* 31* 48*  CREATININE 6.63* 8.93* 11.22*  CALCIUM 9.0 8.4* 8.5*   LFT No results for input(s): "PROT", "ALBUMIN", "AST", "ALT", "ALKPHOS", "BILITOT", "BILIDIR", "IBILI" in the last 72 hours. PT/INR No results for input(s): "LABPROT", "INR" in the last 72 hours. Hepatitis Panel No results for input(s): "HEPBSAG", "HCVAB",  "HEPAIGM", "HEPBIGM" in the last 72 hours.  Studies/Results: No results found.  Scheduled Meds:  sodium chloride   Intravenous Once   atorvastatin  10 mg Oral Once per day on Mon Thu   calcitRIOL  1.75 mcg Oral Q M,W,F-HD   Chlorhexidine Gluconate Cloth  6 each Topical Q0600   Darbepoetin Alfa       darbepoetin (ARANESP) injection - DIALYSIS  200 mcg Intravenous Q Mon-HD   folic acid  1 mg Oral Daily   gabapentin  100 mg Oral QHS   pantoprazole  40 mg Oral BID   sevelamer carbonate  3,200 mg Oral TID WC   sodium chloride flush  10-40 mL Intracatheter Q12H   Continuous Infusions:  ferric gluconate (FERRLECIT) IVPB 250 mg (09/25/21 1049)   sodium chloride Stopped (09/18/21 0737)   PRN Meds:.acetaminophen, albuterol, Darbepoetin Alfa, guaiFENesin, hydrALAZINE, metoprolol tartrate, ondansetron **OR** ondansetron (ZOFRAN) IV, senna-docusate, sodium chloride, sodium chloride flush, sodium chloride flush, traMADol, traZODone   ASSESMENT:   GI bleed, acute blood loss anemia.  Multiple procedures within the last few weeks and in fall 2022.  Suspected source of bleeding was ischemic colitis versus diverticulosis based on colonoscopy 8/2.  8/2 EGD: Clean-based, nonbleeding antral ulcer, mildly chronic active gastritis.  Clean-based, nonbleeding duodenal ulcers, duodenitis.  09/23/21 SBE for recurrent hematochezia: Small, nonbleeding erosions in gastric fundus treated with APC.  Improving gastritis/duodenitis. Video capsule placed.    09/23/2021 colonoscopy with improving appearance of focal ischemic colitis in transverse colon, healthy ileocolonic anastomosis.  Nonbleeding pandiverticulosis.  Nonbleeding internal hemorrhoids.   09/25/21 VCE: Mild duodenal erythema limited to the duodenal bulb otherwise normal study. Received IV iron on 8/12, 8/14.  Weekly Aranesp. 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  He did receive 2 PRBCs on 8/9. Hgb 5... 5.3 since  yesterday.    ESRD.  HD mwf.     PLAN     Supportive care.  No repeat endoscopic studies planned.      [name]  09/25/2021, 1:43 PM Phone [phone]

## 2021-09-25 NOTE — Progress Notes (Signed)
PROGRESS NOTE    Carl Luczynski Sr.  JXB:[id number] DOB: 11/05/1960 DOA: 09/17/2021 PCP: [name], MD   Brief Narrative:   61 y.o. male with a history of ESRD on HD, CVA, hypertension, diverticulosis, hemorrhoids, ischemic colitis, OSA not on CPAP, GERD, PUD, migraines. Patient presented secondary to recurrent bloody stools. GI and Nephro were consulted. PPI BID was started.  Recent EGD/C-scope that showed clean based nonbleeding gastric and duodenal ulcer with segmental colitis, duodenitis, gastritis and pandiverticulosis. Agreed for 2U PRBC.  But despite of that having bleeding, repeat CTA is negative.  Hemoglobin continues to drift down but refusing PRBC now.  GI planning on endoscopic evaluation.   Assessment & Plan:   Recurrent GI bleed  - Recent EGD/C scope that showed clean based nonbleeding gastric and duodenal ulcer with segmental colitis, duodenitis, gastritis and pandiverticulosis.  Status post 2 unit PRBC transfusion on 09/20/2021, hemoglobin still drifted down.  CTA on 8/10 does not show active bleed.  Is discussed with GI on 09/23/2021, EGD and colonoscopy were both negative for active bleeding but did have some nonspecific changes, Stool has been introduced to his small bowel await results.  He has received IV iron on 09/23/2021, already getting Aranesp with dialysis treatments.  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 Alen Blew morning of 09/24/2021.   Hypotension - Chronic hypotension, midodrine if necessary   Acute on chronic anemia - Acute blood loss anemia -  see plan above  ESRD on HD - Nephrology following. MWF HD.   Hyperkalemia - HD per nephrology   Primary hypertension - Not on any meds   Hyperlipidemia - Lipitor   Left hand numbness - Resolved   OSA on CPAP - Patient is nonadherent as an outpatient     DVT prophylaxis: SCDs Start: 09/18/21 0014 Code  Status: Full  Family Communication:    Status is: Inpatient Remains inpatient appropriate because: Low BP during HD, ongoing evaluation for GI bleed   Subjective:  Patient in bed, appears comfortable, denies any headache, no fever, no chest pain or pressure, no shortness of breath , no abdominal pain. No new focal weakness.    Examination:  Awake Alert, No new F.N deficits, Normal affect [place].AT,PERRAL Supple Neck, No JVD,   Symmetrical Chest wall movement, Good air movement bilaterally, CTAB RRR,No Gallops, Rubs or new Murmurs,  +ve B.Sounds, Abd Soft, No tenderness,   No Cyanosis, Clubbing or edema    Objective: Vitals:   09/25/21 0319 09/25/21 0805 09/25/21 0827 09/25/21 0832  BP: (!) 111/56 (!) 102/51 (!) 118/58 110/67  Pulse: 92 90 92 84  Resp: 15 14 (!) 23 15  Temp: 98.7 F (37.1 C) 98.2 F (36.8 C) 97.6 F (36.4 C)   TempSrc: Oral Oral Oral   SpO2: 97% 98% 99% 100%  Weight:   92 kg 90 kg  Height:        Intake/Output Summary (Last 24 hours) at 09/25/2021 0858 Last data filed at 09/25/2021 0300 Gross per 24 hour  Intake 660 ml  Output 2 ml  Net 658 ml    Filed Weights   09/25/21 0100 09/25/21 0827 09/25/21 0832  Weight: 91.6 kg 92 kg 90 kg   Procedures.  Colonoscopy.    Impression:               - Hemorrhoids found on perianal exam.                           -  Patent end-to-side ileo-colonic anastomosis,                            characterized by healthy appearing mucosa.                           - Diverticulosis in the entire examined colon.                           - Localized area of ulcerated mucosa in the                            transverse colon, consistent with ischemic colitis.                            This was much improved compared with the previous                            study. CTA was otherwise without stenosis or                            vascular occlusion.                           - Tortuous colon.                            - Non-bleeding internal hemorrhoids.                           - No specimens collected. Recommendation:           - Return patient to hospital ward for ongoing care.                           - See EGD report for additional details regarding                            ongoing inpatient management.                           - Repeat colonoscopy prn.  EGD.    Impression:               - Normal esophagus.                           - Gastric erosions with active bleeding. Treated                            with argon plasma coagulation (APC).                           - Gastritis.                           - Erythematous duodenopathy.                           -  Normal second portion of the duodenum, third                            portion of the duodenum and fourth portion of the                            duodenum.                           - Successful completion of the Video Capsule                            Enteroscope placement.                           - No specimens collected. Recommendation:           - NPO x2 hours, then start clears and slowly                            advance diet as tolerated per VCE placement                            protocol.                           - Repeat CBC later today and in AM.                           - Recommend IV iron.                           - Perform a colonoscopy today.                           - Continue present medications.                           - Will follow-up on results of VCE.  Possible endoscopy.  Nonacute.  Data Reviewed:   Recent Labs  Lab 09/22/21 1352 09/23/21 0338 09/24/21 0403 09/24/21 1838 09/25/21 0410  WBC 7.5 9.1 5.5 6.0 7.3  HGB 6.0* 6.1* 5.0* 5.2* 5.3*  HCT 18.0* 18.9* 15.3* 16.1* 17.0*  PLT 203 232 212 242 260  MCV 89.1 91.7 89.5 92.0 92.9  MCH 29.7 29.6 29.2 29.7 29.0  MCHC 33.3 32.3 32.7 32.3 31.2  RDW 17.2* 17.2* 17.2* 17.5* 17.6*    Recent Labs  Lab 09/19/21 1109 09/20/21 0457  09/21/21 0341 09/22/21 0450 09/23/21 0338 09/24/21 0403 09/25/21 0410  NA 136   < > 135 133* 136 136 136  K 4.7   < > 4.2 4.8 4.5 4.6 5.2*  CL 97*   < > 96* 95* 97* 98 98  CO2 27   < > '29 27 28 27 24  '$ GLUCOSE 109*   < > 97 87 96 89 89  BUN 41*   < > 32* 51* 25* 31* 48*  CREATININE 8.38*   < > 6.99* 9.57* 6.63* 8.93* 11.22*  CALCIUM 8.6*   < > 8.2* 8.2* 9.0 8.4*  8.5*  ALBUMIN 2.4*  --   --   --   --   --   --   PHOS 4.3  --   --   --   --   --   --    < > = values in this interval not displayed.   Anemia Panel: Recent Labs    09/23/21 0748  VITAMINB12 1,482*  FOLATE 38.5  FERRITIN 1,137*  TIBC 214*  IRON 33*  RETICCTPCT 2.7     Scheduled Meds:  sodium chloride   Intravenous Once   atorvastatin  10 mg Oral Once per day on Mon Thu   calcitRIOL  1.75 mcg Oral Q M,W,F-HD   Chlorhexidine Gluconate Cloth  6 each Topical Q0600   darbepoetin (ARANESP) injection - DIALYSIS  200 mcg Intravenous Q Mon-HD   folic acid  1 mg Oral Daily   pantoprazole  40 mg Oral BID   sevelamer carbonate  3,200 mg Oral TID WC   sodium chloride flush  10-40 mL Intracatheter Q12H   Continuous Infusions:  ferric gluconate (FERRLECIT) IVPB 250 mg (09/24/21 0937)   sodium chloride Stopped (09/18/21 0737)     LOS: 8 days   Time spent= 35 mins  [name], MD [facility]  If 7PM-7AM, please contact night-coverage  09/25/2021, 8:58 AM

## 2021-09-26 DIAGNOSIS — D62 Acute posthemorrhagic anemia: Secondary | ICD-10-CM | POA: Diagnosis not present

## 2021-09-26 DIAGNOSIS — D649 Anemia, unspecified: Secondary | ICD-10-CM | POA: Diagnosis not present

## 2021-09-26 DIAGNOSIS — K625 Hemorrhage of anus and rectum: Secondary | ICD-10-CM | POA: Diagnosis not present

## 2021-09-26 DIAGNOSIS — E875 Hyperkalemia: Secondary | ICD-10-CM | POA: Diagnosis not present

## 2021-09-26 LAB — BASIC METABOLIC PANEL
Anion gap: 9 (ref 5–15)
BUN: 29 mg/dL — ABNORMAL HIGH (ref 8–23)
CO2: 30 mmol/L (ref 22–32)
Calcium: 8.7 mg/dL — ABNORMAL LOW (ref 8.9–10.3)
Chloride: 95 mmol/L — ABNORMAL LOW (ref 98–111)
Creatinine, Ser: 7.74 mg/dL — ABNORMAL HIGH (ref 0.61–1.24)
GFR, Estimated: 7 mL/min — ABNORMAL LOW (ref >60)
Glucose, Bld: 84 mg/dL (ref 70–99)
Potassium: 4.7 mmol/L (ref 3.5–5.1)
Sodium: 134 mmol/L — ABNORMAL LOW (ref 135–145)

## 2021-09-26 LAB — CBC
HCT: 16 % — ABNORMAL LOW (ref 39.0–52.0)
Hemoglobin: 5 g/dL — CL (ref 13.0–17.0)
MCH: 29.4 pg (ref 26.0–34.0)
MCHC: 31.3 g/dL (ref 30.0–36.0)
MCV: 94.1 fL (ref 80.0–100.0)
Platelets: 248 10*3/uL (ref 150–400)
RBC: 1.7 MIL/uL — ABNORMAL LOW (ref 4.22–5.81)
RDW: 17.7 % — ABNORMAL HIGH (ref 11.5–15.5)
WBC: 6.7 10*3/uL (ref 4.0–10.5)
nRBC: 0 % (ref 0.0–0.2)

## 2021-09-26 MED ORDER — CINACALCET HCL 30 MG PO TABS
60.0000 mg | ORAL_TABLET | Freq: Every day | ORAL | Status: DC
  Administered 2021-09-26 – 2021-09-28 (×3): 60 mg via ORAL
  Filled 2021-09-26 (×3): qty 2

## 2021-09-26 NOTE — Progress Notes (Signed)
PROGRESS NOTE    Carl Heitz Sr.  DQQ:[id number] DOB: 20-Oct-1960 DOA: 09/17/2021 PCP: [name], MD   Brief Narrative:   61 y.o. male with a history of ESRD on HD, CVA, hypertension, diverticulosis, hemorrhoids, ischemic colitis, OSA not on CPAP, GERD, PUD, migraines. Patient presented secondary to recurrent bloody stools. GI and Nephro were consulted. PPI BID was started.  Recent EGD/C-scope that showed clean based nonbleeding gastric and duodenal ulcer with segmental colitis, duodenitis, gastritis and pandiverticulosis. Agreed for 2U PRBC.  But despite of that having bleeding, repeat CTA is negative.  Hemoglobin continues to drift down but refusing PRBC now.  GI planning on endoscopic evaluation.   Assessment & Plan:   Recurrent GI bleed  - Recent EGD/C scope that showed clean based nonbleeding gastric and duodenal ulcer with segmental colitis, duodenitis, gastritis and pandiverticulosis.  Status post 2 unit PRBC transfusion on 09/20/2021, hemoglobin still drifted down.  CTA on 8/10 does not show active bleed.  Is discussed with GI on 09/23/2021, EGD and colonoscopy were both negative for active bleeding but did have some nonspecific changes, capsule endoscopy nonacute as well.  He has been placed on IV iron infusions, already getting Aranesp with dialysis treatments.  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 Alen Blew morning of 09/24/2021.  His current H&H is gradually trending down, he states he might take a transfusion on 09/27/2021.   Hypotension - Chronic hypotension, midodrine if necessary   Acute on chronic anemia - Acute blood loss anemia -  see plan above  ESRD on HD - Nephrology following. MWF HD.   Hyperkalemia - HD per nephrology   Primary hypertension - Not on any meds   Hyperlipidemia - Lipitor   Left hand numbness - Resolved   OSA on CPAP - Patient is  nonadherent as an outpatient     DVT prophylaxis: SCDs Start: 09/18/21 0014 Code Status: Full  Family Communication:    Status is: Inpatient Remains inpatient appropriate because: Low BP during HD, ongoing evaluation for GI bleed   Subjective:  Patient in bed, appears comfortable, denies any headache, no fever, no chest pain or pressure, no shortness of breath , no abdominal pain. No new focal weakness.  Examination:  Awake Alert, No new F.N deficits, Normal affect [place].AT,PERRAL Supple Neck, No JVD,   Symmetrical Chest wall movement, Good air movement bilaterally, CTAB RRR,No Gallops, Rubs or new Murmurs,  +ve B.Sounds, Abd Soft, No tenderness,   No Cyanosis, Clubbing or edema     Objective: Vitals:   09/25/21 2307 09/26/21 0411 09/26/21 0430 09/26/21 0823  BP: 123/64 117/69  (!) 101/50  Pulse: 95 74  95  Resp: '16 17  13  '$ Temp: 98.4 F (36.9 C) 98.2 F (36.8 C)  98 F (36.7 C)  TempSrc: Oral Oral  Oral  SpO2: 95% 96%  95%  Weight:   90.6 kg   Height:        Intake/Output Summary (Last 24 hours) at 09/26/2021 0956 Last data filed at 09/25/2021 1230 Gross per 24 hour  Intake --  Output 2000 ml  Net -2000 ml    Filed Weights   09/25/21 0832 09/25/21 1246 09/26/21 0430  Weight: 92 kg 90.1 kg 90.6 kg   Procedures.  Colonoscopy.    Impression:               - Hemorrhoids  found on perianal exam.                           - Patent end-to-side ileo-colonic anastomosis,                            characterized by healthy appearing mucosa.                           - Diverticulosis in the entire examined colon.                           - Localized area of ulcerated mucosa in the                            transverse colon, consistent with ischemic colitis.                            This was much improved compared with the previous                            study. CTA was otherwise without stenosis or                            vascular occlusion.                            - Tortuous colon.                           - Non-bleeding internal hemorrhoids.                           - No specimens collected. Recommendation:           - Return patient to hospital ward for ongoing care.                           - See EGD report for additional details regarding                            ongoing inpatient management.                           - Repeat colonoscopy prn.  EGD.    Impression:               - Normal esophagus.                           - Gastric erosions with active bleeding. Treated                            with argon plasma coagulation (APC).                           - Gastritis.                           -  Erythematous duodenopathy.                           - Normal second portion of the duodenum, third                            portion of the duodenum and fourth portion of the                            duodenum.                           - Successful completion of the Video Capsule                            Enteroscope placement.                           - No specimens collected. Recommendation:           - NPO x2 hours, then start clears and slowly                            advance diet as tolerated per VCE placement                            protocol.                           - Repeat CBC later today and in AM.                           - Recommend IV iron.                           - Perform a colonoscopy today.                           - Continue present medications.                           - Will follow-up on results of VCE.  Capsule endoscopy.  Nonacute.   Data Reviewed:   Recent Labs  Lab 09/23/21 0338 09/24/21 0403 09/24/21 1838 09/25/21 0410 09/26/21 0322  WBC 9.1 5.5 6.0 7.3 6.7  HGB 6.1* 5.0* 5.2* 5.3* 5.0*  HCT 18.9* 15.3* 16.1* 17.0* 16.0*  PLT 232 212 242 260 248  MCV 91.7 89.5 92.0 92.9 94.1  MCH 29.6 29.2 29.7 29.0 29.4  MCHC 32.3 32.7 32.3 31.2 31.3  RDW 17.2* 17.2* 17.5* 17.6* 17.7*     Recent Labs  Lab 09/19/21 1109 09/20/21 0457 09/22/21 0450 09/23/21 0338 09/24/21 0403 09/25/21 0410 09/26/21 0322  NA 136   < > 133* 136 136 136 134*  K 4.7   < > 4.8 4.5 4.6 5.2* 4.7  CL 97*   < > 95* 97* 98 98 95*  CO2 27   < > '27 28 27 24 30  '$ GLUCOSE 109*   < > 87 96 89 89 84  BUN 41*   < >  51* 25* 31* 48* 29*  CREATININE 8.38*   < > 9.57* 6.63* 8.93* 11.22* 7.74*  CALCIUM 8.6*   < > 8.2* 9.0 8.4* 8.5* 8.7*  ALBUMIN 2.4*  --   --   --   --   --   --   PHOS 4.3  --   --   --   --   --   --    < > = values in this interval not displayed.   Anemia Panel: No results for input(s): "VITAMINB12", "FOLATE", "FERRITIN", "TIBC", "IRON", "RETICCTPCT" in the last 72 hours.    Scheduled Meds:  sodium chloride   Intravenous Once   atorvastatin  10 mg Oral Once per day on Mon Thu   calcitRIOL  1.75 mcg Oral Q M,W,F-HD   Chlorhexidine Gluconate Cloth  6 each Topical Q0600   darbepoetin (ARANESP) injection - DIALYSIS  200 mcg Intravenous Q Mon-HD   folic acid  1 mg Oral Daily   gabapentin  100 mg Oral QHS   pantoprazole  40 mg Oral BID   sevelamer carbonate  3,200 mg Oral TID WC   sodium chloride flush  10-40 mL Intracatheter Q12H   Continuous Infusions:  ferric gluconate (FERRLECIT) IVPB 250 mg (09/26/21 9678)   sodium chloride Stopped (09/18/21 0737)     LOS: 9 days   Time spent= 35 mins  [name], MD [facility]  If 7PM-7AM, please contact night-coverage  09/26/2021, 9:56 AM

## 2021-09-26 NOTE — Progress Notes (Signed)
[name] Progress Note   Subjective:  Seen in room. Hgb 5.0 this am. Feels ok. No further bleeding. Stools have been brown. No cp, dyspnea. Declines blood this am.    Objective Vitals:   09/25/21 2307 09/26/21 0411 09/26/21 0430 09/26/21 0823  BP: 123/64 117/69  (!) 101/50  Pulse: 95 74  95  Resp: '16 17  13  '$ Temp: 98.4 F (36.9 C) 98.2 F (36.8 C)  98 F (36.7 C)  TempSrc: Oral Oral  Oral  SpO2: 95% 96%  95%  Weight:   90.6 kg   Height:       Physical Exam General: NAD Heart: RRR Lungs: CTA bilaterally Abdomen: Soft, non-distended, +BS Extremities: No edema b/l lower extremities Dialysis access: RUE AVF +b/t  Additional Objective Labs: Basic Metabolic Panel: Recent Labs  Lab 09/19/21 1109 09/20/21 0457 09/24/21 0403 09/25/21 0410 09/26/21 0322  NA 136   < > 136 136 134*  K 4.7   < > 4.6 5.2* 4.7  CL 97*   < > 98 98 95*  CO2 27   < > '27 24 30  '$ GLUCOSE 109*   < > 89 89 84  BUN 41*   < > 31* 48* 29*  CREATININE 8.38*   < > 8.93* 11.22* 7.74*  CALCIUM 8.6*   < > 8.4* 8.5* 8.7*  PHOS 4.3  --   --   --   --    < > = values in this interval not displayed.    Liver Function Tests: Recent Labs  Lab 09/19/21 1109  ALBUMIN 2.4*    No results for input(s): "LIPASE", "AMYLASE" in the last 168 hours. CBC: Recent Labs  Lab 09/23/21 0338 09/24/21 0403 09/24/21 1838 09/25/21 0410 09/26/21 0322  WBC 9.1 5.5 6.0 7.3 6.7  HGB 6.1* 5.0* 5.2* 5.3* 5.0*  HCT 18.9* 15.3* 16.1* 17.0* 16.0*  MCV 91.7 89.5 92.0 92.9 94.1  PLT 232 212 242 260 248    Blood Culture    Component Value Date/Time   SDES BLOOD LEFT HAND 08/01/2015 0624   SPECREQUEST BOTTLES DRAWN AEROBIC AND ANAEROBIC  5CC 08/01/2015 0624   CULT NO GROWTH 5 DAYS 08/01/2015 0624   REPTSTATUS 08/06/2015 FINAL 08/01/2015 0624    Cardiac Enzymes: No results for input(s): "CKTOTAL", "CKMB", "CKMBINDEX", "TROPONINI" in the last 168 hours. CBG: No results for input(s): "GLUCAP" in the  last 168 hours. Iron Studies:  No results for input(s): "IRON", "TIBC", "TRANSFERRIN", "FERRITIN" in the last 72 hours.  '@lablastinr3'$ @ Studies/Results: No results found. Medications:  ferric gluconate (FERRLECIT) IVPB 250 mg (09/26/21 3235)   sodium chloride Stopped (09/18/21 0737)    sodium chloride   Intravenous Once   atorvastatin  10 mg Oral Once per day on Mon Thu   calcitRIOL  1.75 mcg Oral Q M,W,F-HD   Chlorhexidine Gluconate Cloth  6 each Topical Q0600   darbepoetin (ARANESP) injection - DIALYSIS  200 mcg Intravenous Q Mon-HD   folic acid  1 mg Oral Daily   gabapentin  100 mg Oral QHS   pantoprazole  40 mg Oral BID   sevelamer carbonate  3,200 mg Oral TID WC   sodium chloride flush  10-40 mL Intracatheter Q12H    Dialysis Orders: MWF [facility]  3h 44mn    400/700   87.5kg  2K/3Ca bath  Hep 3500  AVF RFA - last HD 8/4, post wt 87kg, idwg's low recently - last Hb 9.8 on 8/4, last tsat 32% and ferritin  1435 in July - last mircera 75 ug on 7/26, q2, due next week - calcitriol 1.75 ug tiw po  Assessment/Plan: GI bleed - recurrent, here last week w/ EGD showing nonbleeding GU/ DU's. Jehovahs witness but did accept blood on 8/9 - transfused 2 u prbcs. Declines PRBC today despite education about risks. CTA completed without source of bleeding identified. S/p EGD and colonoscopy - no active bleed found.  ESRD - on HD MWF. will c/w MWF schedule Hyperkalemia - Corrected with HD.  Anemia esrd - See above, Hb dropping w/ active GIB. S/p 2U prbcs 8/9. Received ESA early (darbe 100 ug  on 8/7). Increased ESA dose to 200 on 8/14.  Tsat 15% Ferritin 1137. Iron dextran allergy in chart but has received IV Fe as outpatient. Getting  ferrlecit load starting 8/12 MBD ckd - CCa and phos in range, continue sevelamer and VDRA  BP/ vol - BP stable. Not BP lowering meds. Minimal UF with HD.  H/o CVA

## 2021-09-27 DIAGNOSIS — E875 Hyperkalemia: Secondary | ICD-10-CM | POA: Diagnosis not present

## 2021-09-27 DIAGNOSIS — D62 Acute posthemorrhagic anemia: Secondary | ICD-10-CM | POA: Diagnosis not present

## 2021-09-27 DIAGNOSIS — D649 Anemia, unspecified: Secondary | ICD-10-CM | POA: Diagnosis not present

## 2021-09-27 DIAGNOSIS — K625 Hemorrhage of anus and rectum: Secondary | ICD-10-CM | POA: Diagnosis not present

## 2021-09-27 LAB — CBC
HCT: 15.8 % — ABNORMAL LOW (ref 39.0–52.0)
Hemoglobin: 5 g/dL — CL (ref 13.0–17.0)
MCH: 29.6 pg (ref 26.0–34.0)
MCHC: 31.6 g/dL (ref 30.0–36.0)
MCV: 93.5 fL (ref 80.0–100.0)
Platelets: 255 10*3/uL (ref 150–400)
RBC: 1.69 MIL/uL — ABNORMAL LOW (ref 4.22–5.81)
RDW: 17.8 % — ABNORMAL HIGH (ref 11.5–15.5)
WBC: 7.6 10*3/uL (ref 4.0–10.5)
nRBC: 0 % (ref 0.0–0.2)

## 2021-09-27 LAB — BASIC METABOLIC PANEL
Anion gap: 9 (ref 5–15)
BUN: 44 mg/dL — ABNORMAL HIGH (ref 8–23)
CO2: 29 mmol/L (ref 22–32)
Calcium: 8.7 mg/dL — ABNORMAL LOW (ref 8.9–10.3)
Chloride: 95 mmol/L — ABNORMAL LOW (ref 98–111)
Creatinine, Ser: 9.97 mg/dL — ABNORMAL HIGH (ref 0.61–1.24)
GFR, Estimated: 5 mL/min — ABNORMAL LOW (ref >60)
Glucose, Bld: 85 mg/dL (ref 70–99)
Potassium: 4.9 mmol/L (ref 3.5–5.1)
Sodium: 133 mmol/L — ABNORMAL LOW (ref 135–145)

## 2021-09-27 MED ORDER — ALTEPLASE 2 MG IJ SOLR: 2.0000 mg | Freq: Once | INTRAMUSCULAR | Status: DC | PRN

## 2021-09-27 MED ORDER — HEPARIN SODIUM (PORCINE) 1000 UNIT/ML DIALYSIS
1000.0000 [IU] | INTRAMUSCULAR | Status: DC | PRN
  Filled 2021-09-27: qty 1

## 2021-09-27 MED ORDER — PENTAFLUOROPROP-TETRAFLUOROETH EX AERO: 1.0000 | INHALATION_SPRAY | CUTANEOUS | Status: DC | PRN

## 2021-09-27 MED ORDER — ANTICOAGULANT SODIUM CITRATE 4% (200MG/5ML) IV SOLN
5.0000 mL | Status: DC | PRN
  Filled 2021-09-27: qty 5

## 2021-09-27 MED ORDER — LIDOCAINE-PRILOCAINE 2.5-2.5 % EX CREA: 1.0000 | TOPICAL_CREAM | CUTANEOUS | Status: DC | PRN

## 2021-09-27 MED ORDER — LIDOCAINE HCL (PF) 1 % IJ SOLN: 5.0000 mL | INTRAMUSCULAR | Status: DC | PRN

## 2021-09-27 NOTE — Plan of Care (Signed)

## 2021-09-27 NOTE — Progress Notes (Signed)
Received patient in bed to unit. Alert and oriented. Informed consent signed and in chart.   Treatment initiated: 0706 Treatment completed: 8937  Patient tolerated well. Patient asked to have UF goal increased to 2L. Transported back to the room alert, without acute distress. Report given to patient's floor nurse nurse.   Access used: RUA- Fistula  Access issues: none   Total UF removed: 2L Medication(s) given: Calcitriol 1.75 mcg  Post HD VS: 119/69 100 % 84 17 98.2  Post HD weight: 90.8    [facility]

## 2021-09-27 NOTE — Progress Notes (Signed)
PROGRESS NOTE    Carl Meininger Sr.  FVC:[id number] DOB: 11-14-1960 DOA: 09/17/2021 PCP: [name], MD   Brief Narrative:   61 y.o. male with a history of ESRD on HD, CVA, hypertension, diverticulosis, hemorrhoids, ischemic colitis, OSA not on CPAP, GERD, PUD, migraines. Patient presented secondary to recurrent bloody stools. GI and Nephro were consulted. PPI BID was started.  Recent EGD/C-scope that showed clean based nonbleeding gastric and duodenal ulcer with segmental colitis, duodenitis, gastritis and pandiverticulosis. Agreed for 2U PRBC.  But despite of that having bleeding, repeat CTA is negative.  Hemoglobin continues to drift down but refusing PRBC now.  GI planning on endoscopic evaluation.   Assessment & Plan:   Recurrent GI bleed  - Recent EGD/C scope that showed clean based nonbleeding gastric and duodenal ulcer with segmental colitis, duodenitis, gastritis and pandiverticulosis.  Status post 2 unit PRBC transfusion on 09/20/2021, hemoglobin still drifted down.  CTA on 8/10 does not show active bleed.  Is discussed with GI on 09/23/2021, EGD and colonoscopy were both negative for active bleeding but did have some nonspecific changes, capsule endoscopy nonacute as well.  He has been placed on IV iron infusions, already getting Aranesp with dialysis treatments.  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 Alen Blew morning of 09/24/2021.  His current H&H is gradually trending down, he states he might take a transfusion on 09/28/2021 if HB drops below 5, he has been informed that if he remains stable and does not want any further transfusions he possibly will be discharged home on 09/28/2021.   Hypotension - Chronic hypotension, midodrine if necessary   Acute on chronic anemia - Acute blood loss anemia -  see plan above  ESRD on HD - Nephrology following. MWF HD.   Hyperkalemia - HD  per nephrology   Primary hypertension - Not on any meds   Hyperlipidemia - Lipitor   Left hand numbness - Resolved   OSA on CPAP - Patient is nonadherent as an outpatient     DVT prophylaxis: SCDs Start: 09/18/21 0014 Code Status: Full  Family Communication:    Status is: Inpatient Remains inpatient appropriate because: Low BP during HD, ongoing evaluation for GI bleed   Subjective:  Patient in bed, appears comfortable, denies any headache, no fever, no chest pain or pressure, no shortness of breath , no abdominal pain. No new focal weakness.   Examination:  Awake Alert, No new F.N deficits, Normal affect [place].AT,PERRAL Supple Neck, No JVD,   Symmetrical Chest wall movement, Good air movement bilaterally, CTAB RRR,No Gallops, Rubs or new Murmurs,  +ve B.Sounds, Abd Soft, No tenderness,   No Cyanosis, Clubbing or edema   Objective: Vitals:   09/27/21 0800 09/27/21 0830 09/27/21 0900 09/27/21 0934  BP: 137/68 132/68 139/66 123/61  Pulse: 75 79 86 78  Resp: '11 12 11 11  '$ Temp:      TempSrc:      SpO2: 100% 100% 100% 100%  Weight:      Height:       No intake or output data in the 24 hours ending 09/27/21 1003   Filed Weights   09/25/21 1246 09/26/21 0430 09/27/21 0653  Weight: 90.1 kg 90.6 kg 92.8 kg   Procedures.  Colonoscopy.    Impression:               - Hemorrhoids found on  perianal exam.                           - Patent end-to-side ileo-colonic anastomosis,                            characterized by healthy appearing mucosa.                           - Diverticulosis in the entire examined colon.                           - Localized area of ulcerated mucosa in the                            transverse colon, consistent with ischemic colitis.                            This was much improved compared with the previous                            study. CTA was otherwise without stenosis or                            vascular occlusion.                            - Tortuous colon.                           - Non-bleeding internal hemorrhoids.                           - No specimens collected. Recommendation:           - Return patient to hospital ward for ongoing care.                           - See EGD report for additional details regarding                            ongoing inpatient management.                           - Repeat colonoscopy prn.  EGD.    Impression:               - Normal esophagus.                           - Gastric erosions with active bleeding. Treated                            with argon plasma coagulation (APC).                           - Gastritis.                           -  Erythematous duodenopathy.                           - Normal second portion of the duodenum, third                            portion of the duodenum and fourth portion of the                            duodenum.                           - Successful completion of the Video Capsule                            Enteroscope placement.                           - No specimens collected. Recommendation:           - NPO x2 hours, then start clears and slowly                            advance diet as tolerated per VCE placement                            protocol.                           - Repeat CBC later today and in AM.                           - Recommend IV iron.                           - Perform a colonoscopy today.                           - Continue present medications.                           - Will follow-up on results of VCE.  Capsule endoscopy.  Nonacute.   Data Reviewed:   Recent Labs  Lab 09/24/21 0403 09/24/21 1838 09/25/21 0410 09/26/21 0322 09/27/21 0351  WBC 5.5 6.0 7.3 6.7 7.6  HGB 5.0* 5.2* 5.3* 5.0* 5.0*  HCT 15.3* 16.1* 17.0* 16.0* 15.8*  PLT 212 242 260 248 255  MCV 89.5 92.0 92.9 94.1 93.5  MCH 29.2 29.7 29.0 29.4 29.6  MCHC 32.7 32.3 31.2 31.3 31.6  RDW 17.2* 17.5* 17.6* 17.7* 17.8*     Recent Labs  Lab 09/23/21 0338 09/24/21 0403 09/25/21 0410 09/26/21 0322 09/27/21 0351  NA 136 136 136 134* 133*  K 4.5 4.6 5.2* 4.7 4.9  CL 97* 98 98 95* 95*  CO2 '28 27 24 30 29  '$ GLUCOSE 96 89 89 84 85  BUN 25* 31* 48* 29* 44*  CREATININE 6.63* 8.93* 11.22* 7.74* 9.97*  CALCIUM 9.0 8.4* 8.5* 8.7* 8.7*   Anemia Panel: No results for input(s): "VITAMINB12", "FOLATE", "FERRITIN", "TIBC", "IRON", "  RETICCTPCT" in the last 72 hours.    Scheduled Meds:  sodium chloride   Intravenous Once   atorvastatin  10 mg Oral Once per day on Mon Thu   calcitRIOL  1.75 mcg Oral Q M,W,F-HD   Chlorhexidine Gluconate Cloth  6 each Topical Q0600   cinacalcet  60 mg Oral Q supper   darbepoetin (ARANESP) injection - DIALYSIS  200 mcg Intravenous Q Mon-HD   folic acid  1 mg Oral Daily   gabapentin  100 mg Oral QHS   pantoprazole  40 mg Oral BID   sevelamer carbonate  3,200 mg Oral TID WC   sodium chloride flush  10-40 mL Intracatheter Q12H   Continuous Infusions:  anticoagulant sodium citrate     sodium chloride Stopped (09/18/21 0737)     LOS: 10 days   Time spent= 35 mins  [name], MD [facility]  If 7PM-7AM, please contact night-coverage  09/27/2021, 10:03 AM

## 2021-09-27 NOTE — Progress Notes (Signed)
[facility] Progress Note   Subjective: Patient seen and examined on dialysis.  Tolerating dialysis, net UF 2 L.  No complaints.   Objective Vitals:   09/27/21 0800 09/27/21 0830 09/27/21 0900 09/27/21 0934  BP: 137/68 132/68 139/66 123/61  Pulse: 75 79 86 78  Resp: '11 12 11 11  '$ Temp:      TempSrc:      SpO2: 100% 100% 100% 100%  Weight:      Height:       Physical Exam General: NAD, laying flat in bed Heart: RRR Lungs: CTA bilaterally Abdomen: Soft, non-distended, +BS Extremities: No edema b/l lower extremities Dialysis access: RUE AVF +b/t  Additional Objective Labs: Basic Metabolic Panel: Recent Labs  Lab 09/25/21 0410 09/26/21 0322 09/27/21 0351  NA 136 134* 133*  K 5.2* 4.7 4.9  CL 98 95* 95*  CO2 '24 30 29  '$ GLUCOSE 89 84 85  BUN 48* 29* 44*  CREATININE 11.22* 7.74* 9.97*  CALCIUM 8.5* 8.7* 8.7*   Liver Function Tests: No results for input(s): "AST", "ALT", "ALKPHOS", "BILITOT", "PROT", "ALBUMIN" in the last 168 hours. No results for input(s): "LIPASE", "AMYLASE" in the last 168 hours. CBC: Recent Labs  Lab 09/24/21 0403 09/24/21 1838 09/25/21 0410 09/26/21 0322 09/27/21 0351  WBC 5.5 6.0 7.3 6.7 7.6  HGB 5.0* 5.2* 5.3* 5.0* 5.0*  HCT 15.3* 16.1* 17.0* 16.0* 15.8*  MCV 89.5 92.0 92.9 94.1 93.5  PLT 212 242 260 248 255   Blood Culture    Component Value Date/Time   SDES BLOOD LEFT HAND 08/01/2015 0624   SPECREQUEST BOTTLES DRAWN AEROBIC AND ANAEROBIC  5CC 08/01/2015 0624   CULT NO GROWTH 5 DAYS 08/01/2015 0624   REPTSTATUS 08/06/2015 FINAL 08/01/2015 0624    Cardiac Enzymes: No results for input(s): "CKTOTAL", "CKMB", "CKMBINDEX", "TROPONINI" in the last 168 hours. CBG: No results for input(s): "GLUCAP" in the last 168 hours. Iron Studies:  No results for input(s): "IRON", "TIBC", "TRANSFERRIN", "FERRITIN" in the last 72 hours.  '@lablastinr3'$ @ Studies/Results: No results found. Medications:  anticoagulant sodium  citrate     sodium chloride Stopped (09/18/21 0737)    sodium chloride   Intravenous Once   atorvastatin  10 mg Oral Once per day on Mon Thu   calcitRIOL  1.75 mcg Oral Q M,W,F-HD   Chlorhexidine Gluconate Cloth  6 each Topical Q0600   cinacalcet  60 mg Oral Q supper   darbepoetin (ARANESP) injection - DIALYSIS  200 mcg Intravenous Q Mon-HD   folic acid  1 mg Oral Daily   gabapentin  100 mg Oral QHS   pantoprazole  40 mg Oral BID   sevelamer carbonate  3,200 mg Oral TID WC   sodium chloride flush  10-40 mL Intracatheter Q12H    Dialysis Orders: MWF [facility]  3h 49mn    400/700   87.5kg  2K/3Ca bath  Hep 3500  AVF RFA - last HD 8/4, post wt 87kg, idwg's low recently - last Hb 9.8 on 8/4, last tsat 32% and ferritin 1435 in July - last mircera 75 ug on 7/26, q2, due next week - calcitriol 1.75 ug tiw po  Assessment/Plan: GI bleed - recurrent, here last week w/ EGD showing nonbleeding GU/ DU's. Jehovahs witness but did accept blood on 8/9 - transfused 2 u prbcs. Declines PRBC today despite education about risks. CTA completed without source of bleeding identified. S/p EGD and colonoscopy - no active bleed found.  ESRD - on HD MWF. will c/w  MWF schedule Hyperkalemia - Corrected with HD.  Anemia esrd - See above, Hb dropping w/ active GIB. S/p 2U prbcs 8/9. Received ESA early (darbe 100 ug  on 8/7). Increased ESA dose to 200 on 8/14.  Tsat 15% Ferritin 1137. Iron dextran allergy in chart but has received IV Fe as outpatient. Getting  ferrlecit load starting 8/12. Refused PRBC today per charting MBD ckd - CCa and phos in range, continue sevelamer and VDRA  BP/ vol - BP stable. Not BP lowering meds. Minimal UF with HD.  H/o CVA

## 2021-09-27 NOTE — Progress Notes (Signed)
Notified J Howerter, DO that hgb is 5. Patient currently is still refusing blood transfusion at this time.

## 2021-09-28 ENCOUNTER — Other Ambulatory Visit: Payer: Self-pay

## 2021-09-28 DIAGNOSIS — K922 Gastrointestinal hemorrhage, unspecified: Secondary | ICD-10-CM | POA: Diagnosis not present

## 2021-09-28 LAB — CBC
HCT: 18.8 % — ABNORMAL LOW (ref 39.0–52.0)
Hemoglobin: 6 g/dL — CL (ref 13.0–17.0)
MCH: 30.3 pg (ref 26.0–34.0)
MCHC: 31.9 g/dL (ref 30.0–36.0)
MCV: 94.9 fL (ref 80.0–100.0)
Platelets: 293 10*3/uL (ref 150–400)
RBC: 1.98 MIL/uL — ABNORMAL LOW (ref 4.22–5.81)
RDW: 18.1 % — ABNORMAL HIGH (ref 11.5–15.5)
WBC: 8.1 10*3/uL (ref 4.0–10.5)
nRBC: 0.2 % (ref 0.0–0.2)

## 2021-09-28 LAB — BPAM RBC
Blood Product Expiration Date: 202309112359
Unit Type and Rh: 5100

## 2021-09-28 LAB — TYPE AND SCREEN
ABO/RH(D): O POS
Antibody Screen: NEGATIVE
Unit division: 0

## 2021-09-28 NOTE — Progress Notes (Signed)
Order received to pull the CVC line. However, [facility] was unable to place a PIV with/without [place], assessed by multiple IV nurses, on this patient which led to CVC placement. Secure chat sent to Dr. Rita Ohara RN, which agreed CVC will remain until DC tomorrow. Instructed to have order placed for CVC removal when pt is ready for DC. Fran Lowes, RN [facility]

## 2021-09-28 NOTE — Progress Notes (Signed)
[facility] Progress Note   Subjective:  Seen in room. No concerns. Feels good. No further bleeding. Hgb up to 6.0    Objective Vitals:   09/27/21 1945 09/28/21 0043 09/28/21 0411 09/28/21 0500  BP: 113/60 122/64 133/72   Pulse:  96 84   Resp: '18 16 12   '$ Temp: 98.5 F (36.9 C) 98.7 F (37.1 C) 98.2 F (36.8 C)   TempSrc: Oral Oral Oral   SpO2: 100% 100% 99%   Weight:    91.8 kg  Height:       Physical Exam General: NAD, laying flat in bed Heart: RRR Lungs: CTA bilaterally Abdomen: Soft, non-distended, +BS Extremities: No edema b/l lower extremities Dialysis access: RUE AVF +b/t  Additional Objective Labs: Basic Metabolic Panel: Recent Labs  Lab 09/25/21 0410 09/26/21 0322 09/27/21 0351  NA 136 134* 133*  K 5.2* 4.7 4.9  CL 98 95* 95*  CO2 '24 30 29  '$ GLUCOSE 89 84 85  BUN 48* 29* 44*  CREATININE 11.22* 7.74* 9.97*  CALCIUM 8.5* 8.7* 8.7*    Liver Function Tests: No results for input(s): "AST", "ALT", "ALKPHOS", "BILITOT", "PROT", "ALBUMIN" in the last 168 hours. No results for input(s): "LIPASE", "AMYLASE" in the last 168 hours. CBC: Recent Labs  Lab 09/24/21 1838 09/25/21 0410 09/26/21 0322 09/27/21 0351 09/28/21 0625  WBC 6.0 7.3 6.7 7.6 8.1  HGB 5.2* 5.3* 5.0* 5.0* 6.0*  HCT 16.1* 17.0* 16.0* 15.8* 18.8*  MCV 92.0 92.9 94.1 93.5 94.9  PLT 242 260 248 255 293    Blood Culture    Component Value Date/Time   SDES BLOOD LEFT HAND 08/01/2015 0624   SPECREQUEST BOTTLES DRAWN AEROBIC AND ANAEROBIC  5CC 08/01/2015 0624   CULT NO GROWTH 5 DAYS 08/01/2015 0624   REPTSTATUS 08/06/2015 FINAL 08/01/2015 0624    Cardiac Enzymes: No results for input(s): "CKTOTAL", "CKMB", "CKMBINDEX", "TROPONINI" in the last 168 hours. CBG: No results for input(s): "GLUCAP" in the last 168 hours. Iron Studies:  No results for input(s): "IRON", "TIBC", "TRANSFERRIN", "FERRITIN" in the last 72 hours.  '@lablastinr3'$ @ Studies/Results: No results  found. Medications:  sodium chloride Stopped (09/18/21 0737)    sodium chloride   Intravenous Once   atorvastatin  10 mg Oral Once per day on Mon Thu   calcitRIOL  1.75 mcg Oral Q M,W,F-HD   Chlorhexidine Gluconate Cloth  6 each Topical Q0600   cinacalcet  60 mg Oral Q supper   darbepoetin (ARANESP) injection - DIALYSIS  200 mcg Intravenous Q Mon-HD   folic acid  1 mg Oral Daily   gabapentin  100 mg Oral QHS   pantoprazole  40 mg Oral BID   sevelamer carbonate  3,200 mg Oral TID WC   sodium chloride flush  10-40 mL Intracatheter Q12H    Dialysis Orders: MWF [facility]  3h 63mn    400/700   87.5kg  2K/3Ca bath  Hep 3500  AVF RFA - last HD 8/4, post wt 87kg, idwg's low recently - last Hb 9.8 on 8/4, last tsat 32% and ferritin 1435 in July - last mircera 75 ug on 7/26, q2, due next week - calcitriol 1.75 ug tiw po  Assessment/Plan: GI bleed - recurrent, here last week w/ EGD showing nonbleeding GU/ DU's. Jehovahs witness but did accept blood on 8/9 - transfused 2 u prbcs. Declines PRBC today despite education about risks. CTA completed without source of bleeding identified. S/p EGD and colonoscopy - no active bleed found.  ESRD -  on HD MWF. will c/w MWF schedule. HD 8/18.  Hyperkalemia - Corrected with HD.  Anemia esrd - See above, Hb dropping d/t GIB. S/p 2U prbcs 8/9. Received ESA early (darbe 100 ug  on 8/7). Increased ESA dose to 200 on 8/14.  Tsat 15% Ferritin 1137. Iron dextran allergy in chart but has received IV Fe as outpatient. Received Ferrlecit 250 x 4 here.  MBD ckd - CCa and phos in range, continue sevelamer and VDRA  BP/ vol - BP stable. Not BP lowering meds. Minimal UF with HD.  H/o CVA  Lynnda Child PA-C [facility] Pager [phone] 09/28/2021,8:52 AM

## 2021-09-28 NOTE — Progress Notes (Signed)
PROGRESS NOTE    Carl Delpilar Sr.  LOV:[id number] DOB: [birth year] DOA: 09/17/2021 PCP: [name], MD   Brief Narrative:   61 y.o. male with a history of ESRD on HD, CVA, hypertension, diverticulosis, hemorrhoids, ischemic colitis, OSA not on CPAP, GERD, PUD, migraines. Patient presented secondary to recurrent bloody stools. GI and Nephro were consulted. PPI BID was started.  Recent EGD/C-scope that showed clean based nonbleeding gastric and duodenal ulcer with segmental colitis, duodenitis, gastritis and pandiverticulosis. Agreed for 2U PRBC.  But despite of that having bleeding, repeat CTA is negative.  Hemoglobin continues to drift down but refusing PRBC now.  GI planning on endoscopic evaluation.   Assessment & Plan:   Recurrent GI bleed  - Recent EGD/C scope that showed clean based nonbleeding gastric and duodenal ulcer with segmental colitis, duodenitis, gastritis and pandiverticulosis.  Status post 2 unit PRBC transfusion on 09/20/2021, hemoglobin still drifted down.  CTA on 8/10 does not show active bleed.  Is discussed with GI on 09/23/2021, EGD and colonoscopy were both negative for active bleeding but did have some nonspecific changes, capsule endoscopy nonacute as well.  He has been placed on IV iron infusions, already getting Aranesp with dialysis treatments.  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 Alen Blew morning of 09/24/2021.  His current H&H seems to have stabilized, patient requests to be monitored 1 more day if H&H is stable tomorrow he wants to go home if it drops below 5 he wants to take 2 units of transfusion.  Overall he remains extremely indecisive over the last several days and keeps on going back and forth tween taking transfusion and not taking it.  Hypotension - Chronic hypotension, midodrine if necessary   Acute on chronic anemia - Acute blood loss anemia -   see plan above  ESRD on HD - Nephrology following. MWF HD.   Hyperkalemia - HD per nephrology   Primary hypertension - Not on any meds   Hyperlipidemia - Lipitor   Left hand numbness - Resolved   OSA on CPAP - Patient is nonadherent as an outpatient     DVT prophylaxis: SCDs Start: 09/18/21 0014 Code Status: Full  Family Communication:    Status is: Inpatient Remains inpatient appropriate because: Low BP during HD, ongoing evaluation for GI bleed   Subjective:  Patient in bed, appears comfortable, denies any headache, no fever, no chest pain or pressure, no shortness of breath , no abdominal pain. No new focal weakness.   Examination:  Awake Alert, No new F.N deficits, L.IJ C.Line [place].AT,PERRAL Supple Neck, No JVD,   Symmetrical Chest wall movement, Good air movement bilaterally, CTAB RRR,No Gallops, Rubs or new Murmurs,  +ve B.Sounds, Abd Soft, No tenderness,   No Cyanosis, Clubbing or edema    Objective: Vitals:   09/27/21 1945 09/28/21 0043 09/28/21 0411 09/28/21 0500  BP: 113/60 122/64 133/72   Pulse:  96 84   Resp: '18 16 12   '$ Temp: 98.5 F (36.9 C) 98.7 F (37.1 C) 98.2 F (36.8 C)   TempSrc: Oral Oral Oral   SpO2: 100% 100% 99%   Weight:    91.8 kg  Height:        Intake/Output Summary (Last 24 hours) at 09/28/2021 1009 Last data filed at 09/27/2021 1057 Gross per 24 hour  Intake --  Output 2000 ml  Net -2000 ml  Filed Weights   09/27/21 0653 09/27/21 1057 09/28/21 0500  Weight: 92.8 kg 90.8 kg 91.8 kg   Procedures.  Colonoscopy.    Impression:               - Hemorrhoids found on perianal exam.                           - Patent end-to-side ileo-colonic anastomosis,                            characterized by healthy appearing mucosa.                           - Diverticulosis in the entire examined colon.                           - Localized area of ulcerated mucosa in the                            transverse colon, consistent  with ischemic colitis.                            This was much improved compared with the previous                            study. CTA was otherwise without stenosis or                            vascular occlusion.                           - Tortuous colon.                           - Non-bleeding internal hemorrhoids.                           - No specimens collected. Recommendation:           - Return patient to hospital ward for ongoing care.                           - See EGD report for additional details regarding                            ongoing inpatient management.                           - Repeat colonoscopy prn.  EGD.    Impression:               - Normal esophagus.                           - Gastric erosions with active bleeding. Treated                            with argon plasma coagulation (APC).                           -  Gastritis.                           - Erythematous duodenopathy.                           - Normal second portion of the duodenum, third                            portion of the duodenum and fourth portion of the                            duodenum.                           - Successful completion of the Video Capsule                            Enteroscope placement.                           - No specimens collected. Recommendation:           - NPO x2 hours, then start clears and slowly                            advance diet as tolerated per VCE placement                            protocol.                           - Repeat CBC later today and in AM.                           - Recommend IV iron.                           - Perform a colonoscopy today.                           - Continue present medications.                           - Will follow-up on results of VCE.  Capsule endoscopy.  Nonacute.   Data Reviewed:   Recent Labs  Lab 09/24/21 1838 09/25/21 0410 09/26/21 0322 09/27/21 0351 09/28/21 0625  WBC 6.0 7.3  6.7 7.6 8.1  HGB 5.2* 5.3* 5.0* 5.0* 6.0*  HCT 16.1* 17.0* 16.0* 15.8* 18.8*  PLT 242 260 248 255 293  MCV 92.0 92.9 94.1 93.5 94.9  MCH 29.7 29.0 29.4 29.6 30.3  MCHC 32.3 31.2 31.3 31.6 31.9  RDW 17.5* 17.6* 17.7* 17.8* 18.1*    Recent Labs  Lab 09/23/21 0338 09/24/21 0403 09/25/21 0410 09/26/21 0322 09/27/21 0351  NA 136 136 136 134* 133*  K 4.5 4.6 5.2* 4.7 4.9  CL 97* 98 98 95* 95*  CO2 '28 27 24 30 29  '$ GLUCOSE 96 89 89 84 85  BUN 25* 31* 48* 29*  44*  CREATININE 6.63* 8.93* 11.22* 7.74* 9.97*  CALCIUM 9.0 8.4* 8.5* 8.7* 8.7*   Anemia Panel: No results for input(s): "VITAMINB12", "FOLATE", "FERRITIN", "TIBC", "IRON", "RETICCTPCT" in the last 72 hours.    Scheduled Meds:  sodium chloride   Intravenous Once   atorvastatin  10 mg Oral Once per day on Mon Thu   calcitRIOL  1.75 mcg Oral Q M,W,F-HD   Chlorhexidine Gluconate Cloth  6 each Topical Q0600   cinacalcet  60 mg Oral Q supper   darbepoetin (ARANESP) injection - DIALYSIS  200 mcg Intravenous Q Mon-HD   folic acid  1 mg Oral Daily   gabapentin  100 mg Oral QHS   pantoprazole  40 mg Oral BID   sevelamer carbonate  3,200 mg Oral TID WC   sodium chloride flush  10-40 mL Intracatheter Q12H   Continuous Infusions:  sodium chloride Stopped (09/18/21 0737)     LOS: 11 days   Time spent= 35 mins  [name], MD [facility]  If 7PM-7AM, please contact night-coverage  09/28/2021, 10:09 AM

## 2021-09-29 ENCOUNTER — Other Ambulatory Visit (HOSPITAL_COMMUNITY): Payer: Self-pay

## 2021-09-29 DIAGNOSIS — K625 Hemorrhage of anus and rectum: Secondary | ICD-10-CM | POA: Diagnosis not present

## 2021-09-29 DIAGNOSIS — D649 Anemia, unspecified: Secondary | ICD-10-CM | POA: Diagnosis not present

## 2021-09-29 DIAGNOSIS — E875 Hyperkalemia: Secondary | ICD-10-CM | POA: Diagnosis not present

## 2021-09-29 DIAGNOSIS — K559 Vascular disorder of intestine, unspecified: Secondary | ICD-10-CM

## 2021-09-29 DIAGNOSIS — D62 Acute posthemorrhagic anemia: Secondary | ICD-10-CM | POA: Diagnosis not present

## 2021-09-29 LAB — CBC
HCT: 18.1 % — ABNORMAL LOW (ref 39.0–52.0)
Hemoglobin: 5.7 g/dL — CL (ref 13.0–17.0)
MCH: 30.3 pg (ref 26.0–34.0)
MCHC: 31.5 g/dL (ref 30.0–36.0)
MCV: 96.3 fL (ref 80.0–100.0)
Platelets: 307 10*3/uL (ref 150–400)
RBC: 1.88 MIL/uL — ABNORMAL LOW (ref 4.22–5.81)
RDW: 18.9 % — ABNORMAL HIGH (ref 11.5–15.5)
WBC: 7.5 10*3/uL (ref 4.0–10.5)
nRBC: 0 % (ref 0.0–0.2)

## 2021-09-29 MED ORDER — FOLIC ACID 1 MG PO TABS
1.0000 mg | ORAL_TABLET | Freq: Every day | ORAL | 0 refills | Status: DC
  Filled 2021-09-29: qty 30, 30d supply, fill #0

## 2021-09-29 MED ORDER — DIPHENHYDRAMINE HCL 25 MG PO CAPS
25.0000 mg | ORAL_CAPSULE | Freq: Once | ORAL | Status: AC
  Administered 2021-09-29: 25 mg via ORAL
  Filled 2021-09-29: qty 1

## 2021-09-29 MED ORDER — FERROUS SULFATE 325 (65 FE) MG PO TABS
ORAL_TABLET | ORAL | 1 refills | Status: DC
  Filled 2021-09-29: qty 30, 30d supply, fill #0

## 2021-09-29 MED ORDER — FERROUS SULFATE 325 (65 FE) MG PO TABS: 325.0000 mg | ORAL_TABLET | Freq: Two times a day (BID) | ORAL | 0 refills | Status: DC

## 2021-09-29 NOTE — Discharge Summary (Signed)
Carl Dorantes Sr. EXH:[id number] DOB: [birth year] DOA: 09/17/2021  PCP: [name], MD  Admit date: 09/17/2021  Discharge date: 09/29/2021  Admitted From: Home   Disposition:  Home   Recommendations for Outpatient Follow-up:   Follow up with PCP in 1-2 weeks  PCP Please obtain BMP/CBC, 2 view CXR in 1week,  (see Discharge instructions)   PCP Please follow up on the following pending results: Monitor anemia panel, CBC closely.   Home Health: None   Equipment/Devices: None  Consultations: Renal Discharge Condition: Stable    CODE STATUS: Full    Diet Recommendation: Renal diet with 1.5 L fluid restriction per day.     Chief Complaint  Patient presents with   Rectal Bleeding     Brief history of present illness from the day of admission and additional interim summary    61 y.o. male with a history of ESRD on HD, CVA, hypertension, diverticulosis, hemorrhoids, ischemic colitis, OSA not on CPAP, GERD, PUD, migraines. Patient presented secondary to recurrent bloody stools. GI and Nephro were consulted. PPI BID was started.  Recent EGD/C-scope that showed clean based nonbleeding gastric and duodenal ulcer with segmental colitis, duodenitis, gastritis and pandiverticulosis. Agreed for 2U PRBC.  But despite of that having bleeding, repeat CTA is negative.  Hemoglobin continues to drift down but refusing PRBC now.  GI planning on endoscopic evaluation.                                                                 Hospital Course   Recurrent GI bleed  - Recent EGD/C scope that showed clean based nonbleeding gastric and duodenal ulcer with segmental colitis, duodenitis, gastritis and pandiverticulosis. CTA on 8/10 does not show active bleed.  Case discussed with hematologist Dr. Alen Blew on 09/24/2021 along with GI team  as well, EGD and colonoscopy were both negative for active bleeding but did have some nonspecific changes, capsule endoscopy nonacute as well. Status post 2 unit PRBC transfusion on 09/20/2021, was placed on IV iron infusions, already getting Aranesp with dialysis treatments. 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  He at this time will be discharged home on PPI, iron and folic acid combination with close outpatient follow-up with PCP.  No signs of ongoing bleeding.  Post discharge will follow with PCP within a week, requested to follow-up with his gastroenterologist within a few weeks as well.  PCP to arrange.    Hypotension - Chronic hypotension, on midodrine here.  Currently stable no acute issues.   Acute on  chronic anemia - Acute blood loss anemia -  see plan above   ESRD on HD - Nephrology following. MWF HD.   Hyperkalemia - HD per nephrology, takes [facility] at home as well.   Primary hypertension - Not on any meds   Hyperlipidemia - Lipitor   Left hand numbness - Resolved   OSA on CPAP - Patient is nonadherent as an outpatient, counseled on compliance.  PCP to monitor.  Discharge diagnosis     Active Problems:   Anemia   GI bleed   Bright red blood per rectum   ABLA (acute blood loss anemia)   Gastric ulcer   Duodenal ulcer   Diverticulosis of colon without hemorrhage   Gastritis and gastroduodenitis   Ischemic colitis ([place])   Duodenitis    Discharge instructions    Discharge Instructions     Discharge instructions   Complete by: As directed    Follow with Primary MD Koirala, Dibas, MD in 7 days   Get CBC, CMP -  checked next visit within 1 week by Primary MD    Activity: As tolerated with Full fall precautions use  walker/cane & assistance as needed  Disposition Home    Diet: Renal diet with 1.5 L fluid restriction per day.  Special Instructions: If you have smoked or chewed Tobacco  in the last 2 yrs please stop smoking, stop any regular Alcohol  and or any Recreational drug use.  On your next visit with your primary care physician please Get Medicines reviewed and adjusted.  Please request your Prim.MD to go over all Hospital Tests and Procedure/Radiological results at the follow up, please get all Hospital records sent to your Prim MD by signing hospital release before you go home.  If you experience worsening of your admission symptoms, develop shortness of breath, life threatening emergency, suicidal or homicidal thoughts you must seek medical attention immediately by calling 911 or calling your MD immediately  if symptoms less severe.  You Must read complete instructions/literature along with all the possible adverse reactions/side effects for all the Medicines you take and that have been prescribed to you. Take any new Medicines after you have completely understood and accpet all the possible adverse reactions/side effects.   Increase activity slowly   Complete by: As directed        Discharge Medications   Allergies as of 09/29/2021       Reactions   Infed [iron Dextran] Other (See Comments)   Decreased BP    Oxycodone Nausea Only   Vicodin [hydrocodone-acetaminophen] Other (See Comments)   Decreased BP   Diclofenac Other (See Comments)   Unknown        Medication List     STOP taking these medications    camphor-menthol lotion Commonly known as: SARNA   ibuprofen 800 MG tablet Commonly known as: ADVIL       TAKE these medications    acetaminophen 650 MG CR tablet Commonly known as: TYLENOL Take 1,300 mg by mouth every 8 (eight) hours as needed for pain.   amoxicillin 500 MG capsule Commonly known as: AMOXIL Take 4 pills one hour prior to dental work    atorvastatin 10 MG tablet Commonly known as: LIPITOR Take 10 mg by mouth 2 (two) times a week.   augmented betamethasone dipropionate 0.05 % ointment Commonly known as: DIPROLENE-AF Apply 1 Application topically 2 (two) times daily as needed for rash.   cinacalcet 60 MG tablet Commonly known as: SENSIPAR Take 120 mg  by mouth daily with supper.   cromolyn 4 % ophthalmic solution Commonly known as: OPTICROM Place 1 drop into both eyes daily as needed (redness).   cyanocobalamin 1000 MCG tablet Commonly known as: VITAMIN B12 Take 1,000 mcg by mouth daily.   docusate sodium 100 MG capsule Commonly known as: Colace Take 1 capsule (100 mg total) by mouth daily as needed.   ferrous sulfate 325 (65 FE) MG tablet Take 1 tablet (325 mg total) by mouth 2 (two) times daily with a meal.   folic acid 1 MG tablet Commonly known as: FOLVITE Take 1 tablet (1 mg total) by mouth daily.   gabapentin 100 MG capsule Commonly known as: NEURONTIN Take 100 mg by mouth at bedtime.   Lokelma 10 g Pack packet Generic drug: sodium zirconium cyclosilicate Take 1 packet by mouth daily.   MIRCERA IJ Mircera   multivitamin Tabs tablet Take 1 tablet by mouth daily.   pantoprazole 40 MG tablet Commonly known as: PROTONIX Take 1 tablet (40 mg total) by mouth 2 (two) times daily before a meal.   sevelamer carbonate 800 MG tablet Commonly known as: RENVELA Take 4,000 mg by mouth 3 (three) times daily with meals. 5 tabs with meals   sucralfate 1 GM/10ML suspension Commonly known as: CARAFATE Take 10 mLs (1 g total) by mouth 2 (two) times daily.   vitamin E 180 MG (400 UNITS) capsule Take 400 Units by mouth 3 (three) times a week.         Follow-up Information     Koirala, Dibas, MD. Schedule an appointment as soon as possible for a visit in 1 week(s).   Specialty: Family Medicine Contact information: [place] [address] [phone]          Buford Dresser, MD .   Specialty: Cardiology Contact information: [address] [name] [address] [phone]                 Major procedures and Radiology Reports - PLEASE review detailed and final reports thoroughly  -       CT ANGIO GI BLEED  Result Date: 09/21/2021 CLINICAL DATA:  Lower gastrointestinal bleeding. EXAM: CTA ABDOMEN AND PELVIS WITHOUT AND WITH CONTRAST TECHNIQUE: Multidetector CT imaging of the abdomen and pelvis was performed using the standard protocol during bolus administration of intravenous contrast. Multiplanar reconstructed images and MIPs were obtained and reviewed to evaluate the vascular anatomy. RADIATION DOSE REDUCTION: This exam was performed according to the departmental dose-optimization program which includes automated exposure control, adjustment of the mA and/or kV according to patient size and/or use of iterative reconstruction technique. CONTRAST:  158m OMNIPAQUE IOHEXOL 350 MG/ML SOLN COMPARISON:  September 13, 2021. FINDINGS: VASCULAR Aorta: Atherosclerosis of abdominal aorta is noted without aneurysm or dissection. Celiac: Patent without evidence of aneurysm, dissection, vasculitis or significant stenosis. SMA: Patent without evidence of aneurysm, dissection, vasculitis or significant stenosis. 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 IMA: Patent without evidence of aneurysm, dissection, vasculitis or significant stenosis. Inflow: Patent without evidence of aneurysm, dissection, vasculitis or significant stenosis. Proximal Outflow: Bilateral common femoral and visualized portions of the superficial and profunda femoral arteries are patent without evidence of aneurysm, dissection, vasculitis or significant stenosis. Veins: No obvious venous abnormality within the limitations of this arterial phase study. Review of the MIP images confirms the above findings.  NON-VASCULAR Lower chest: No acute abnormality. Hepatobiliary: No focal liver abnormality  is seen. No gallstones, gallbladder wall thickening, or biliary dilatation. Pancreas: Unremarkable. No pancreatic ductal dilatation or surrounding inflammatory changes. Spleen: Normal in size without focal abnormality. Adrenals/Urinary Tract: Adrenal glands appear normal. Status post bilateral nephrectomy. Urinary bladder is not well visualized due to scatter artifact arising from left hip prosthesis. Stomach/Bowel: Stomach is unremarkable. Patient is status post right hemicolectomy. There is no evidence of bowel obstruction or inflammation. No definite gastrointestinal bleeding is noted. Lymphatic: No adenopathy is noted. Reproductive: Prostate is unremarkable. Other: No abdominal wall hernia or abnormality. No abdominopelvic ascites. Musculoskeletal: Findings consistent with renal osteodystrophy. No acute osseous abnormality is noted. Status post left total hip arthroplasty. IMPRESSION: VASCULAR Atherosclerosis of abdominal aorta is noted without aneurysm or dissection. No definite evidence of contrast extravasation to suggest active gastrointestinal bleeding. NON-VASCULAR Status post bilateral nephrectomy. Status post right hemicolectomy. No definite acute abnormality seen in the abdomen or pelvis. Electronically Signed   By: Marijo Conception M.D.   On: 09/21/2021 09:26   DG CHEST PORT 1 VIEW  Result Date: 09/20/2021 CLINICAL DATA:  Central line placement EXAM: PORTABLE CHEST 1 VIEW COMPARISON:  06/18/2020 FINDINGS: Left central line in place with the tip in the right atrium. No pneumothorax. Heart and mediastinal contours are within normal limits. No focal opacities or effusions. No acute bony abnormality. Aortic atherosclerosis. IMPRESSION: No active cardiopulmonary disease. Electronically Signed   By: Rolm Baptise M.D.   On: 09/20/2021 20:37   [place] EKG SITE RITE  Result Date: 09/20/2021 If Site Rite image not attached,  placement could not be confirmed due to current cardiac rhythm.  CT ABDOMEN PELVIS W CONTRAST  Result Date: 09/13/2021 CLINICAL DATA:  Inflammatory bowel disease, ischemic colitis, GI bleeding EXAM: CT ABDOMEN AND PELVIS WITH CONTRAST TECHNIQUE: Multidetector CT imaging of the abdomen and pelvis was performed using the standard protocol following bolus administration of intravenous contrast. RADIATION DOSE REDUCTION: This exam was performed according to the departmental dose-optimization program which includes automated exposure control, adjustment of the mA and/or kV according to patient size and/or use of iterative reconstruction technique. CONTRAST:  170m OMNIPAQUE IOHEXOL 350 MG/ML SOLN COMPARISON:  07/31/2019 FINDINGS: Lower chest: No acute abnormality.  Coronary artery calcifications. Hepatobiliary: No solid liver abnormality is seen. No gallstones, gallbladder wall thickening, or biliary dilatation. Pancreas: Unremarkable. No pancreatic ductal dilatation or surrounding inflammatory changes. Spleen: Normal in size without significant abnormality. Adrenals/Urinary Tract: Adrenal glands are unremarkable. Status post bilateral nephrectomy. Very limited evaluation of the low pelvis and bladder secondary to dense metallic streak artifact from adjacent hip arthroplasty. Stomach/Bowel: Stomach is within normal limits. Status post partial right hemicolectomy and ileocolic anastomosis. Transverse, descending, and sigmoid diverticulosis. There may be some bowel wall thickening, for example in the mid transverse colon (series 3, image 34) and in the distal sigmoid colon (series 3, image 62). Vascular/Lymphatic: Aortic atherosclerosis. No enlarged abdominal or pelvic lymph nodes. Reproductive: Very limited evaluation of the low pelvis and prostate secondary to dense metallic streak artifact from adjacent hip arthroplasty. Other: No abdominal wall hernia or abnormality. No ascites. Musculoskeletal: Renal  osteodystrophy. Status post left hip total arthroplasty. IMPRESSION: 1. Status post partial right hemicolectomy and ileocolic anastomosis. Transverse, descending, and sigmoid diverticulosis. There may be some bowel wall thickening, for example in the mid transverse colon and in the distal sigmoid colon. Findings are suggestive of diverticulitis or other nonspecific colitis. 2. Status post bilateral nephrectomy. 3. Renal osteodystrophy. 4. Coronary artery disease. Aortic Atherosclerosis (ICD10-I70.0). Electronically Signed   By:  Delanna Ahmadi M.D.   On: 09/13/2021 12:38   CT Head Wo Contrast  Result Date: 09/11/2021 CLINICAL DATA:  Headaches. EXAM: CT HEAD WITHOUT CONTRAST TECHNIQUE: Contiguous axial images were obtained from the base of the skull through the vertex without intravenous contrast. RADIATION DOSE REDUCTION: This exam was performed according to the departmental dose-optimization program which includes automated exposure control, adjustment of the mA and/or kV according to patient size and/or use of iterative reconstruction technique. COMPARISON:  03/12/2018 FINDINGS: Brain: No evidence of acute infarction, hemorrhage, hydrocephalus, extra-axial collection or mass lesion/mass effect. Extensive dural calcification in thickening is again identified and appears similar to the previous exam. Vascular: No hyperdense vessel or unexpected calcification. Skull: Normal. Negative for fracture or focal lesion. Sinuses/Orbits: No acute finding. Other: None IMPRESSION: 1. No acute intracranial abnormalities. No significant change from prior exam. Electronically Signed   By: Kerby Moors M.D.   On: 09/11/2021 06:29       Today   Subjective    [name] today has no headache,no chest abdominal pain,no new weakness tingling or numbness, feels much better wants to go home today.     Objective   Blood pressure 135/69, pulse 78, temperature 98.5 F (36.9 C), temperature source Oral, resp. rate 11,  height '6\' 2"'$  (1.88 m), weight 93.9 kg, SpO2 100 %.  No intake or output data in the 24 hours ending 09/29/21 1001  Exam  Awake Alert, No new F.N deficits,    [place].AT,PERRAL Supple Neck,   Symmetrical Chest wall movement, Good air movement bilaterally, CTAB RRR,No Gallops,   +ve B.Sounds, Abd Soft, Non tender,  No Cyanosis, Clubbing or edema    Data Review   Recent Labs  Lab 09/25/21 0410 09/26/21 0322 09/27/21 0351 09/28/21 0625 09/29/21 0730  WBC 7.3 6.7 7.6 8.1 7.5  HGB 5.3* 5.0* 5.0* 6.0* 5.7*  HCT 17.0* 16.0* 15.8* 18.8* 18.1*  PLT 260 248 255 293 307  MCV 92.9 94.1 93.5 94.9 96.3  MCH 29.0 29.4 29.6 30.3 30.3  MCHC 31.2 31.3 31.6 31.9 31.5  RDW 17.6* 17.7* 17.8* 18.1* 18.9*    Recent Labs  Lab 09/23/21 0338 09/24/21 0403 09/25/21 0410 09/26/21 0322 09/27/21 0351  NA 136 136 136 134* 133*  K 4.5 4.6 5.2* 4.7 4.9  CL 97* 98 98 95* 95*  CO2 '28 27 24 30 29  '$ GLUCOSE 96 89 89 84 85  BUN 25* 31* 48* 29* 44*  CREATININE 6.63* 8.93* 11.22* 7.74* 9.97*  CALCIUM 9.0 8.4* 8.5* 8.7* 8.7*    Total Time in preparing paper work, data evaluation and todays exam - 30 minutes  [name] M.D on 09/29/2021 at 10:01 AM  [facility]

## 2021-09-29 NOTE — TOC Transition Note (Signed)
Transition of Care [facility]) - CM/SW Discharge Note   Patient Details  Name: Carl Domangue Sr. MRN: [id number] Date of Birth: December 24, 1960  Transition of Care [facility]) CM/SW Contact:  Pollie Friar, RN Phone Number: 09/29/2021, 2:13 PM   Clinical Narrative:    Pt is discharging home with self care. No needs per TOC.   Final next level of care: Home/Self Care Barriers to Discharge: No Barriers Identified   Patient Goals and CMS Choice        Discharge Placement                       Discharge Plan and Services                                     Social Determinants of Health (SDOH) Interventions     Readmission Risk Interventions     No data to display

## 2021-09-29 NOTE — Progress Notes (Signed)
Pt Francetta Found from rm 7 called to say he lost his bag of meds and all the prescriptions. Has looked everywhere. None found on the unit. His nurse Gregary Signs RN went down with him to get his meds. He thinks he may have left them in a chair somewhere. His phone number is [phone]. Pt d/c to home on 09/29/2021 at 1643

## 2021-09-29 NOTE — Progress Notes (Signed)
Received patient in bed to unit.  Alert and oriented.  Informed consent signed and in  chart.   Treatment initiated: 0735 Treatment completed: 1158  Patient tolerated well.  Transported back to the room  alert, without acute distress.  Hand-off given to patient's nurse.   Access used: fistula Access issues: none  Total UF removed: 3000 Medication(s) given: calcitrol Post HD VS: 98.5kg, 144/75(93), HR-83, RR-17,SP02-100 Post HD weight: 90.4kg  Pt stated that he needs more than 3L pulled off because he is over his dry weight. Sated dry weight is 87.5kg   [facility]

## 2021-09-29 NOTE — Progress Notes (Signed)
[facility] Progress Note   Subjective:   Patient seen and examined at bedside in dialysis. Tolerating treatment well.  Reports he has been up and walking around.  Feeling ok.  Denies CP, SOB, abdominal pain and n/v/d. Hgb slightly lower at 5.7 today. Agreed to increase goal to 3L today.    Objective Vitals:   09/29/21 0721 09/29/21 0724 09/29/21 0735 09/29/21 0810  BP:  136/81 (!) 141/78 (!) 147/79  Pulse:  74 71 84  Resp:  '13 18 16  '$ Temp:  98.5 F (36.9 C)    TempSrc:  Oral    SpO2:  100% 100% 100%  Weight: 93.9 kg     Height:       Physical Exam General:well appearing male in NAD Heart:RRR, no mrg Lungs:CTAB, nml WOB on RA Abdomen:soft, NTND Extremities: trace LE edema Dialysis Access: RU AVF in use   Filed Weights   09/28/21 0500 09/29/21 0500 09/29/21 0721  Weight: 91.8 kg 92.9 kg 93.9 kg   No intake or output data in the 24 hours ending 09/29/21 0830  Additional Objective Labs: Basic Metabolic Panel: Recent Labs  Lab 09/25/21 0410 09/26/21 0322 09/27/21 0351  NA 136 134* 133*  K 5.2* 4.7 4.9  CL 98 95* 95*  CO2 '24 30 29  '$ GLUCOSE 89 84 85  BUN 48* 29* 44*  CREATININE 11.22* 7.74* 9.97*  CALCIUM 8.5* 8.7* 8.7*   CBC: Recent Labs  Lab 09/25/21 0410 09/26/21 0322 09/27/21 0351 09/28/21 0625 09/29/21 0730  WBC 7.3 6.7 7.6 8.1 7.5  HGB 5.3* 5.0* 5.0* 6.0* 5.7*  HCT 17.0* 16.0* 15.8* 18.8* 18.1*  MCV 92.9 94.1 93.5 94.9 96.3  PLT 260 248 255 293 307   Blood Culture    Component Value Date/Time   SDES BLOOD LEFT HAND 08/01/2015 0624   SPECREQUEST BOTTLES DRAWN AEROBIC AND ANAEROBIC  5CC 08/01/2015 0624   CULT NO GROWTH 5 DAYS 08/01/2015 0624   REPTSTATUS 08/06/2015 FINAL 08/01/2015 3154    Medications:  sodium chloride Stopped (09/18/21 0737)    sodium chloride   Intravenous Once   atorvastatin  10 mg Oral Once per day on Mon Thu   calcitRIOL  1.75 mcg Oral Q M,W,F-HD   Chlorhexidine Gluconate Cloth  6 each Topical Q0600    cinacalcet  60 mg Oral Q supper   darbepoetin (ARANESP) injection - DIALYSIS  200 mcg Intravenous Q Mon-HD   folic acid  1 mg Oral Daily   gabapentin  100 mg Oral QHS   pantoprazole  40 mg Oral BID   sevelamer carbonate  3,200 mg Oral TID WC   sodium chloride flush  10-40 mL Intracatheter Q12H    Dialysis Orders: MWF [facility]  3h 23mn    400/700   87.5kg  2K/3Ca bath  Hep 3500  AVF RFA - last HD 8/4, post wt 87kg, idwg's low recently - last Hb 9.8 on 8/4, last tsat 32% and ferritin 1435 in July - last mircera 75 ug on 7/26, q2, due next week - calcitriol 1.75 ug tiw po   Assessment/Plan: GI bleed - recurrent, here earlier this month with same, EGD showed nonbleeding GU/ DU's. Jehovahs witness but did accept blood on 8/9 - transfused 2 u prbcs. Currently Declining PRBC despite education about risks. CTA completed without source of bleeding identified. S/p EGD and colonoscopy - no active bleed found. GI following. ESRD - on HD MWF. HD today.  Hyperkalemia - Corrected with HD.  Anemia esrd -  See above, Hb dropping d/t GIB. S/p 2U prbcs 8/9. Received ESA early (darbe 100 ug  on 8/7). Increased ESA dose to 200 on 8/14.  Tsat 15% Ferritin 1137. Iron dextran allergy in chart but has received IV Fe as outpatient. Received Ferrlecit 250 x 4 here.  MBD ckd - CCa and phos in range, continue sevelamer and VDRA  BP/ vol - BP stable. Not BP lowering meds. Standing wt at 94kg this AM, 6kg over dry, increased goal to 3L today.  H/o CVA  Jen Mow, PA-C [facility] 09/29/2021,8:30 AM  LOS: 12 days

## 2021-09-29 NOTE — Discharge Instructions (Signed)
Follow with Primary MD Koirala, Dibas, MD in 7 days   Get CBC, CMP -  checked next visit within 1 week by Primary MD    Activity: As tolerated with Full fall precautions use walker/cane & assistance as needed  Disposition Home    Diet: Renal diet with 1.5 L fluid restriction per day.  Special Instructions: If you have smoked or chewed Tobacco  in the last 2 yrs please stop smoking, stop any regular Alcohol  and or any Recreational drug use.  On your next visit with your primary care physician please Get Medicines reviewed and adjusted.  Please request your Prim.MD to go over all Hospital Tests and Procedure/Radiological results at the follow up, please get all Hospital records sent to your Prim MD by signing hospital release before you go home.  If you experience worsening of your admission symptoms, develop shortness of breath, life threatening emergency, suicidal or homicidal thoughts you must seek medical attention immediately by calling 911 or calling your MD immediately  if symptoms less severe.  You Must read complete instructions/literature along with all the possible adverse reactions/side effects for all the Medicines you take and that have been prescribed to you. Take any new Medicines after you have completely understood and accpet all the possible adverse reactions/side effects.

## 2021-09-29 NOTE — Progress Notes (Signed)
Pt to d/c today. Contacted [facility] to advise clinic of pt's d/c today and that pt will resume care on Monday.   Melven Sartorius Renal Navigator [phone]

## 2021-10-02 DIAGNOSIS — D689 Coagulation defect, unspecified: Secondary | ICD-10-CM | POA: Diagnosis not present

## 2021-10-02 DIAGNOSIS — Z7682 Awaiting organ transplant status: Secondary | ICD-10-CM | POA: Diagnosis not present

## 2021-10-02 DIAGNOSIS — N186 End stage renal disease: Secondary | ICD-10-CM | POA: Diagnosis not present

## 2021-10-02 DIAGNOSIS — N2581 Secondary hyperparathyroidism of renal origin: Secondary | ICD-10-CM | POA: Diagnosis not present

## 2021-10-02 DIAGNOSIS — Z992 Dependence on renal dialysis: Secondary | ICD-10-CM | POA: Diagnosis not present

## 2021-10-02 DIAGNOSIS — D631 Anemia in chronic kidney disease: Secondary | ICD-10-CM | POA: Diagnosis not present

## 2021-10-02 DIAGNOSIS — E875 Hyperkalemia: Secondary | ICD-10-CM | POA: Diagnosis not present

## 2021-10-04 DIAGNOSIS — Z992 Dependence on renal dialysis: Secondary | ICD-10-CM | POA: Diagnosis not present

## 2021-10-04 DIAGNOSIS — Z7682 Awaiting organ transplant status: Secondary | ICD-10-CM | POA: Diagnosis not present

## 2021-10-04 DIAGNOSIS — D689 Coagulation defect, unspecified: Secondary | ICD-10-CM | POA: Diagnosis not present

## 2021-10-04 DIAGNOSIS — E875 Hyperkalemia: Secondary | ICD-10-CM | POA: Diagnosis not present

## 2021-10-04 DIAGNOSIS — D631 Anemia in chronic kidney disease: Secondary | ICD-10-CM | POA: Diagnosis not present

## 2021-10-04 DIAGNOSIS — N186 End stage renal disease: Secondary | ICD-10-CM | POA: Diagnosis not present

## 2021-10-04 DIAGNOSIS — N2581 Secondary hyperparathyroidism of renal origin: Secondary | ICD-10-CM | POA: Diagnosis not present

## 2021-10-06 DIAGNOSIS — Z992 Dependence on renal dialysis: Secondary | ICD-10-CM | POA: Diagnosis not present

## 2021-10-06 DIAGNOSIS — D631 Anemia in chronic kidney disease: Secondary | ICD-10-CM | POA: Diagnosis not present

## 2021-10-06 DIAGNOSIS — D689 Coagulation defect, unspecified: Secondary | ICD-10-CM | POA: Diagnosis not present

## 2021-10-06 DIAGNOSIS — Z7682 Awaiting organ transplant status: Secondary | ICD-10-CM | POA: Diagnosis not present

## 2021-10-06 DIAGNOSIS — E875 Hyperkalemia: Secondary | ICD-10-CM | POA: Diagnosis not present

## 2021-10-06 DIAGNOSIS — N2581 Secondary hyperparathyroidism of renal origin: Secondary | ICD-10-CM | POA: Diagnosis not present

## 2021-10-06 DIAGNOSIS — N186 End stage renal disease: Secondary | ICD-10-CM | POA: Diagnosis not present

## 2021-10-09 DIAGNOSIS — N186 End stage renal disease: Secondary | ICD-10-CM | POA: Diagnosis not present

## 2021-10-09 DIAGNOSIS — Z7682 Awaiting organ transplant status: Secondary | ICD-10-CM | POA: Diagnosis not present

## 2021-10-09 DIAGNOSIS — N2581 Secondary hyperparathyroidism of renal origin: Secondary | ICD-10-CM | POA: Diagnosis not present

## 2021-10-09 DIAGNOSIS — D631 Anemia in chronic kidney disease: Secondary | ICD-10-CM | POA: Diagnosis not present

## 2021-10-09 DIAGNOSIS — Z992 Dependence on renal dialysis: Secondary | ICD-10-CM | POA: Diagnosis not present

## 2021-10-09 DIAGNOSIS — E875 Hyperkalemia: Secondary | ICD-10-CM | POA: Diagnosis not present

## 2021-10-09 DIAGNOSIS — D689 Coagulation defect, unspecified: Secondary | ICD-10-CM | POA: Diagnosis not present

## 2021-10-11 DIAGNOSIS — N186 End stage renal disease: Secondary | ICD-10-CM | POA: Diagnosis not present

## 2021-10-11 DIAGNOSIS — D631 Anemia in chronic kidney disease: Secondary | ICD-10-CM | POA: Diagnosis not present

## 2021-10-11 DIAGNOSIS — Z992 Dependence on renal dialysis: Secondary | ICD-10-CM | POA: Diagnosis not present

## 2021-10-11 DIAGNOSIS — Z7682 Awaiting organ transplant status: Secondary | ICD-10-CM | POA: Diagnosis not present

## 2021-10-11 DIAGNOSIS — E875 Hyperkalemia: Secondary | ICD-10-CM | POA: Diagnosis not present

## 2021-10-11 DIAGNOSIS — D689 Coagulation defect, unspecified: Secondary | ICD-10-CM | POA: Diagnosis not present

## 2021-10-11 DIAGNOSIS — N2581 Secondary hyperparathyroidism of renal origin: Secondary | ICD-10-CM | POA: Diagnosis not present

## 2021-10-12 DIAGNOSIS — N186 End stage renal disease: Secondary | ICD-10-CM | POA: Diagnosis not present

## 2021-10-12 DIAGNOSIS — I12 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 stage renal disease: Secondary | ICD-10-CM | POA: Diagnosis not present

## 2021-10-12 DIAGNOSIS — Z992 Dependence on renal dialysis: Secondary | ICD-10-CM | POA: Diagnosis not present

## 2021-10-13 DIAGNOSIS — Z992 Dependence on renal dialysis: Secondary | ICD-10-CM | POA: Diagnosis not present

## 2021-10-13 DIAGNOSIS — N2581 Secondary hyperparathyroidism of renal origin: Secondary | ICD-10-CM | POA: Diagnosis not present

## 2021-10-13 DIAGNOSIS — D631 Anemia in chronic kidney disease: Secondary | ICD-10-CM | POA: Diagnosis not present

## 2021-10-13 DIAGNOSIS — N186 End stage renal disease: Secondary | ICD-10-CM | POA: Diagnosis not present

## 2021-10-13 DIAGNOSIS — R519 Headache, unspecified: Secondary | ICD-10-CM | POA: Diagnosis not present

## 2021-10-13 DIAGNOSIS — E1129 Type 2 diabetes mellitus with other diabetic kidney complication: Secondary | ICD-10-CM | POA: Diagnosis not present

## 2021-10-13 DIAGNOSIS — E875 Hyperkalemia: Secondary | ICD-10-CM | POA: Diagnosis not present

## 2021-10-13 DIAGNOSIS — Z7682 Awaiting organ transplant status: Secondary | ICD-10-CM | POA: Diagnosis not present

## 2021-10-13 DIAGNOSIS — D689 Coagulation defect, unspecified: Secondary | ICD-10-CM | POA: Diagnosis not present

## 2021-10-16 DIAGNOSIS — Z992 Dependence on renal dialysis: Secondary | ICD-10-CM | POA: Diagnosis not present

## 2021-10-16 DIAGNOSIS — Z7682 Awaiting organ transplant status: Secondary | ICD-10-CM | POA: Diagnosis not present

## 2021-10-16 DIAGNOSIS — N2581 Secondary hyperparathyroidism of renal origin: Secondary | ICD-10-CM | POA: Diagnosis not present

## 2021-10-16 DIAGNOSIS — E1129 Type 2 diabetes mellitus with other diabetic kidney complication: Secondary | ICD-10-CM | POA: Diagnosis not present

## 2021-10-16 DIAGNOSIS — R519 Headache, unspecified: Secondary | ICD-10-CM | POA: Diagnosis not present

## 2021-10-16 DIAGNOSIS — E875 Hyperkalemia: Secondary | ICD-10-CM | POA: Diagnosis not present

## 2021-10-16 DIAGNOSIS — D689 Coagulation defect, unspecified: Secondary | ICD-10-CM | POA: Diagnosis not present

## 2021-10-16 DIAGNOSIS — D631 Anemia in chronic kidney disease: Secondary | ICD-10-CM | POA: Diagnosis not present

## 2021-10-16 DIAGNOSIS — N186 End stage renal disease: Secondary | ICD-10-CM | POA: Diagnosis not present

## 2021-10-18 DIAGNOSIS — R519 Headache, unspecified: Secondary | ICD-10-CM | POA: Diagnosis not present

## 2021-10-18 DIAGNOSIS — D689 Coagulation defect, unspecified: Secondary | ICD-10-CM | POA: Diagnosis not present

## 2021-10-18 DIAGNOSIS — N186 End stage renal disease: Secondary | ICD-10-CM | POA: Diagnosis not present

## 2021-10-18 DIAGNOSIS — Z7682 Awaiting organ transplant status: Secondary | ICD-10-CM | POA: Diagnosis not present

## 2021-10-18 DIAGNOSIS — E1129 Type 2 diabetes mellitus with other diabetic kidney complication: Secondary | ICD-10-CM | POA: Diagnosis not present

## 2021-10-18 DIAGNOSIS — N2581 Secondary hyperparathyroidism of renal origin: Secondary | ICD-10-CM | POA: Diagnosis not present

## 2021-10-18 DIAGNOSIS — E875 Hyperkalemia: Secondary | ICD-10-CM | POA: Diagnosis not present

## 2021-10-18 DIAGNOSIS — D631 Anemia in chronic kidney disease: Secondary | ICD-10-CM | POA: Diagnosis not present

## 2021-10-18 DIAGNOSIS — Z992 Dependence on renal dialysis: Secondary | ICD-10-CM | POA: Diagnosis not present

## 2021-10-19 DIAGNOSIS — N186 End stage renal disease: Secondary | ICD-10-CM | POA: Diagnosis not present

## 2021-10-19 DIAGNOSIS — E611 Iron deficiency: Secondary | ICD-10-CM | POA: Diagnosis not present

## 2021-10-19 DIAGNOSIS — I700 Atherosclerosis of aorta: Secondary | ICD-10-CM | POA: Diagnosis not present

## 2021-10-20 DIAGNOSIS — R519 Headache, unspecified: Secondary | ICD-10-CM | POA: Diagnosis not present

## 2021-10-20 DIAGNOSIS — Z7682 Awaiting organ transplant status: Secondary | ICD-10-CM | POA: Diagnosis not present

## 2021-10-20 DIAGNOSIS — E875 Hyperkalemia: Secondary | ICD-10-CM | POA: Diagnosis not present

## 2021-10-20 DIAGNOSIS — N2581 Secondary hyperparathyroidism of renal origin: Secondary | ICD-10-CM | POA: Diagnosis not present

## 2021-10-20 DIAGNOSIS — N186 End stage renal disease: Secondary | ICD-10-CM | POA: Diagnosis not present

## 2021-10-20 DIAGNOSIS — D689 Coagulation defect, unspecified: Secondary | ICD-10-CM | POA: Diagnosis not present

## 2021-10-20 DIAGNOSIS — Z992 Dependence on renal dialysis: Secondary | ICD-10-CM | POA: Diagnosis not present

## 2021-10-20 DIAGNOSIS — D631 Anemia in chronic kidney disease: Secondary | ICD-10-CM | POA: Diagnosis not present

## 2021-10-20 DIAGNOSIS — E1129 Type 2 diabetes mellitus with other diabetic kidney complication: Secondary | ICD-10-CM | POA: Diagnosis not present

## 2021-10-23 ENCOUNTER — Encounter (HOSPITAL_BASED_OUTPATIENT_CLINIC_OR_DEPARTMENT_OTHER): Payer: Self-pay

## 2021-10-23 DIAGNOSIS — N2581 Secondary hyperparathyroidism of renal origin: Secondary | ICD-10-CM | POA: Diagnosis not present

## 2021-10-23 DIAGNOSIS — Z7682 Awaiting organ transplant status: Secondary | ICD-10-CM | POA: Diagnosis not present

## 2021-10-23 DIAGNOSIS — Z992 Dependence on renal dialysis: Secondary | ICD-10-CM | POA: Diagnosis not present

## 2021-10-23 DIAGNOSIS — N186 End stage renal disease: Secondary | ICD-10-CM | POA: Diagnosis not present

## 2021-10-23 DIAGNOSIS — E1129 Type 2 diabetes mellitus with other diabetic kidney complication: Secondary | ICD-10-CM | POA: Diagnosis not present

## 2021-10-23 DIAGNOSIS — D631 Anemia in chronic kidney disease: Secondary | ICD-10-CM | POA: Diagnosis not present

## 2021-10-23 DIAGNOSIS — E875 Hyperkalemia: Secondary | ICD-10-CM | POA: Diagnosis not present

## 2021-10-23 DIAGNOSIS — R519 Headache, unspecified: Secondary | ICD-10-CM | POA: Diagnosis not present

## 2021-10-23 DIAGNOSIS — D689 Coagulation defect, unspecified: Secondary | ICD-10-CM | POA: Diagnosis not present

## 2021-10-25 ENCOUNTER — Ambulatory Visit (HOSPITAL_BASED_OUTPATIENT_CLINIC_OR_DEPARTMENT_OTHER): Payer: Medicare Other | Admitting: Cardiology

## 2021-10-25 DIAGNOSIS — E1129 Type 2 diabetes mellitus with other diabetic kidney complication: Secondary | ICD-10-CM | POA: Diagnosis not present

## 2021-10-25 DIAGNOSIS — Z7682 Awaiting organ transplant status: Secondary | ICD-10-CM | POA: Diagnosis not present

## 2021-10-25 DIAGNOSIS — E875 Hyperkalemia: Secondary | ICD-10-CM | POA: Diagnosis not present

## 2021-10-25 DIAGNOSIS — D689 Coagulation defect, unspecified: Secondary | ICD-10-CM | POA: Diagnosis not present

## 2021-10-25 DIAGNOSIS — R519 Headache, unspecified: Secondary | ICD-10-CM | POA: Diagnosis not present

## 2021-10-25 DIAGNOSIS — N2581 Secondary hyperparathyroidism of renal origin: Secondary | ICD-10-CM | POA: Diagnosis not present

## 2021-10-25 DIAGNOSIS — N186 End stage renal disease: Secondary | ICD-10-CM | POA: Diagnosis not present

## 2021-10-25 DIAGNOSIS — D631 Anemia in chronic kidney disease: Secondary | ICD-10-CM | POA: Diagnosis not present

## 2021-10-25 DIAGNOSIS — Z992 Dependence on renal dialysis: Secondary | ICD-10-CM | POA: Diagnosis not present

## 2021-10-27 DIAGNOSIS — D689 Coagulation defect, unspecified: Secondary | ICD-10-CM | POA: Diagnosis not present

## 2021-10-27 DIAGNOSIS — N2581 Secondary hyperparathyroidism of renal origin: Secondary | ICD-10-CM | POA: Diagnosis not present

## 2021-10-27 DIAGNOSIS — D631 Anemia in chronic kidney disease: Secondary | ICD-10-CM | POA: Diagnosis not present

## 2021-10-27 DIAGNOSIS — E1129 Type 2 diabetes mellitus with other diabetic kidney complication: Secondary | ICD-10-CM | POA: Diagnosis not present

## 2021-10-27 DIAGNOSIS — Z992 Dependence on renal dialysis: Secondary | ICD-10-CM | POA: Diagnosis not present

## 2021-10-27 DIAGNOSIS — N186 End stage renal disease: Secondary | ICD-10-CM | POA: Diagnosis not present

## 2021-10-27 DIAGNOSIS — Z7682 Awaiting organ transplant status: Secondary | ICD-10-CM | POA: Diagnosis not present

## 2021-10-27 DIAGNOSIS — E875 Hyperkalemia: Secondary | ICD-10-CM | POA: Diagnosis not present

## 2021-10-27 DIAGNOSIS — R519 Headache, unspecified: Secondary | ICD-10-CM | POA: Diagnosis not present

## 2021-10-30 DIAGNOSIS — N2581 Secondary hyperparathyroidism of renal origin: Secondary | ICD-10-CM | POA: Diagnosis not present

## 2021-10-30 DIAGNOSIS — Z992 Dependence on renal dialysis: Secondary | ICD-10-CM | POA: Diagnosis not present

## 2021-10-30 DIAGNOSIS — D689 Coagulation defect, unspecified: Secondary | ICD-10-CM | POA: Diagnosis not present

## 2021-10-30 DIAGNOSIS — D631 Anemia in chronic kidney disease: Secondary | ICD-10-CM | POA: Diagnosis not present

## 2021-10-30 DIAGNOSIS — Z7682 Awaiting organ transplant status: Secondary | ICD-10-CM | POA: Diagnosis not present

## 2021-10-30 DIAGNOSIS — E1129 Type 2 diabetes mellitus with other diabetic kidney complication: Secondary | ICD-10-CM | POA: Diagnosis not present

## 2021-10-30 DIAGNOSIS — E875 Hyperkalemia: Secondary | ICD-10-CM | POA: Diagnosis not present

## 2021-10-30 DIAGNOSIS — N186 End stage renal disease: Secondary | ICD-10-CM | POA: Diagnosis not present

## 2021-10-30 DIAGNOSIS — R519 Headache, unspecified: Secondary | ICD-10-CM | POA: Diagnosis not present

## 2021-11-01 DIAGNOSIS — N2581 Secondary hyperparathyroidism of renal origin: Secondary | ICD-10-CM | POA: Diagnosis not present

## 2021-11-01 DIAGNOSIS — R519 Headache, unspecified: Secondary | ICD-10-CM | POA: Diagnosis not present

## 2021-11-01 DIAGNOSIS — Z992 Dependence on renal dialysis: Secondary | ICD-10-CM | POA: Diagnosis not present

## 2021-11-01 DIAGNOSIS — N186 End stage renal disease: Secondary | ICD-10-CM | POA: Diagnosis not present

## 2021-11-01 DIAGNOSIS — E1129 Type 2 diabetes mellitus with other diabetic kidney complication: Secondary | ICD-10-CM | POA: Diagnosis not present

## 2021-11-01 DIAGNOSIS — E875 Hyperkalemia: Secondary | ICD-10-CM | POA: Diagnosis not present

## 2021-11-01 DIAGNOSIS — D689 Coagulation defect, unspecified: Secondary | ICD-10-CM | POA: Diagnosis not present

## 2021-11-01 DIAGNOSIS — D631 Anemia in chronic kidney disease: Secondary | ICD-10-CM | POA: Diagnosis not present

## 2021-11-01 DIAGNOSIS — Z7682 Awaiting organ transplant status: Secondary | ICD-10-CM | POA: Diagnosis not present

## 2021-11-03 DIAGNOSIS — D631 Anemia in chronic kidney disease: Secondary | ICD-10-CM | POA: Diagnosis not present

## 2021-11-03 DIAGNOSIS — Z7682 Awaiting organ transplant status: Secondary | ICD-10-CM | POA: Diagnosis not present

## 2021-11-03 DIAGNOSIS — Z992 Dependence on renal dialysis: Secondary | ICD-10-CM | POA: Diagnosis not present

## 2021-11-03 DIAGNOSIS — R519 Headache, unspecified: Secondary | ICD-10-CM | POA: Diagnosis not present

## 2021-11-03 DIAGNOSIS — N2581 Secondary hyperparathyroidism of renal origin: Secondary | ICD-10-CM | POA: Diagnosis not present

## 2021-11-03 DIAGNOSIS — D689 Coagulation defect, unspecified: Secondary | ICD-10-CM | POA: Diagnosis not present

## 2021-11-03 DIAGNOSIS — N186 End stage renal disease: Secondary | ICD-10-CM | POA: Diagnosis not present

## 2021-11-03 DIAGNOSIS — E1129 Type 2 diabetes mellitus with other diabetic kidney complication: Secondary | ICD-10-CM | POA: Diagnosis not present

## 2021-11-03 DIAGNOSIS — E875 Hyperkalemia: Secondary | ICD-10-CM | POA: Diagnosis not present

## 2021-11-06 DIAGNOSIS — N2581 Secondary hyperparathyroidism of renal origin: Secondary | ICD-10-CM | POA: Diagnosis not present

## 2021-11-06 DIAGNOSIS — Z7682 Awaiting organ transplant status: Secondary | ICD-10-CM | POA: Diagnosis not present

## 2021-11-06 DIAGNOSIS — D689 Coagulation defect, unspecified: Secondary | ICD-10-CM | POA: Diagnosis not present

## 2021-11-06 DIAGNOSIS — R519 Headache, unspecified: Secondary | ICD-10-CM | POA: Diagnosis not present

## 2021-11-06 DIAGNOSIS — N186 End stage renal disease: Secondary | ICD-10-CM | POA: Diagnosis not present

## 2021-11-06 DIAGNOSIS — D631 Anemia in chronic kidney disease: Secondary | ICD-10-CM | POA: Diagnosis not present

## 2021-11-06 DIAGNOSIS — Z992 Dependence on renal dialysis: Secondary | ICD-10-CM | POA: Diagnosis not present

## 2021-11-06 DIAGNOSIS — E1129 Type 2 diabetes mellitus with other diabetic kidney complication: Secondary | ICD-10-CM | POA: Diagnosis not present

## 2021-11-06 DIAGNOSIS — E875 Hyperkalemia: Secondary | ICD-10-CM | POA: Diagnosis not present

## 2021-11-08 DIAGNOSIS — D631 Anemia in chronic kidney disease: Secondary | ICD-10-CM | POA: Diagnosis not present

## 2021-11-08 DIAGNOSIS — N186 End stage renal disease: Secondary | ICD-10-CM | POA: Diagnosis not present

## 2021-11-08 DIAGNOSIS — N2581 Secondary hyperparathyroidism of renal origin: Secondary | ICD-10-CM | POA: Diagnosis not present

## 2021-11-08 DIAGNOSIS — R519 Headache, unspecified: Secondary | ICD-10-CM | POA: Diagnosis not present

## 2021-11-08 DIAGNOSIS — Z7682 Awaiting organ transplant status: Secondary | ICD-10-CM | POA: Diagnosis not present

## 2021-11-08 DIAGNOSIS — Z992 Dependence on renal dialysis: Secondary | ICD-10-CM | POA: Diagnosis not present

## 2021-11-08 DIAGNOSIS — E1129 Type 2 diabetes mellitus with other diabetic kidney complication: Secondary | ICD-10-CM | POA: Diagnosis not present

## 2021-11-08 DIAGNOSIS — E875 Hyperkalemia: Secondary | ICD-10-CM | POA: Diagnosis not present

## 2021-11-08 DIAGNOSIS — D689 Coagulation defect, unspecified: Secondary | ICD-10-CM | POA: Diagnosis not present

## 2021-11-10 DIAGNOSIS — E1129 Type 2 diabetes mellitus with other diabetic kidney complication: Secondary | ICD-10-CM | POA: Diagnosis not present

## 2021-11-10 DIAGNOSIS — N186 End stage renal disease: Secondary | ICD-10-CM | POA: Diagnosis not present

## 2021-11-10 DIAGNOSIS — Z992 Dependence on renal dialysis: Secondary | ICD-10-CM | POA: Diagnosis not present

## 2021-11-10 DIAGNOSIS — D631 Anemia in chronic kidney disease: Secondary | ICD-10-CM | POA: Diagnosis not present

## 2021-11-10 DIAGNOSIS — Z7682 Awaiting organ transplant status: Secondary | ICD-10-CM | POA: Diagnosis not present

## 2021-11-10 DIAGNOSIS — N2581 Secondary hyperparathyroidism of renal origin: Secondary | ICD-10-CM | POA: Diagnosis not present

## 2021-11-10 DIAGNOSIS — D689 Coagulation defect, unspecified: Secondary | ICD-10-CM | POA: Diagnosis not present

## 2021-11-10 DIAGNOSIS — R519 Headache, unspecified: Secondary | ICD-10-CM | POA: Diagnosis not present

## 2021-11-10 DIAGNOSIS — E875 Hyperkalemia: Secondary | ICD-10-CM | POA: Diagnosis not present

## 2021-11-11 DIAGNOSIS — Z992 Dependence on renal dialysis: Secondary | ICD-10-CM | POA: Diagnosis not present

## 2021-11-11 DIAGNOSIS — I12 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 stage renal disease: Secondary | ICD-10-CM | POA: Diagnosis not present

## 2021-11-11 DIAGNOSIS — N186 End stage renal disease: Secondary | ICD-10-CM | POA: Diagnosis not present

## 2021-11-13 DIAGNOSIS — N2581 Secondary hyperparathyroidism of renal origin: Secondary | ICD-10-CM | POA: Diagnosis not present

## 2021-11-13 DIAGNOSIS — D631 Anemia in chronic kidney disease: Secondary | ICD-10-CM | POA: Diagnosis not present

## 2021-11-13 DIAGNOSIS — E875 Hyperkalemia: Secondary | ICD-10-CM | POA: Diagnosis not present

## 2021-11-13 DIAGNOSIS — D689 Coagulation defect, unspecified: Secondary | ICD-10-CM | POA: Diagnosis not present

## 2021-11-13 DIAGNOSIS — Z7682 Awaiting organ transplant status: Secondary | ICD-10-CM | POA: Diagnosis not present

## 2021-11-13 DIAGNOSIS — Z992 Dependence on renal dialysis: Secondary | ICD-10-CM | POA: Diagnosis not present

## 2021-11-13 DIAGNOSIS — N186 End stage renal disease: Secondary | ICD-10-CM | POA: Diagnosis not present

## 2021-11-13 DIAGNOSIS — D509 Iron deficiency anemia, unspecified: Secondary | ICD-10-CM | POA: Diagnosis not present

## 2021-11-15 DIAGNOSIS — N186 End stage renal disease: Secondary | ICD-10-CM | POA: Diagnosis not present

## 2021-11-15 DIAGNOSIS — N2581 Secondary hyperparathyroidism of renal origin: Secondary | ICD-10-CM | POA: Diagnosis not present

## 2021-11-15 DIAGNOSIS — Z992 Dependence on renal dialysis: Secondary | ICD-10-CM | POA: Diagnosis not present

## 2021-11-15 DIAGNOSIS — Z7682 Awaiting organ transplant status: Secondary | ICD-10-CM | POA: Diagnosis not present

## 2021-11-15 DIAGNOSIS — D509 Iron deficiency anemia, unspecified: Secondary | ICD-10-CM | POA: Diagnosis not present

## 2021-11-15 DIAGNOSIS — D631 Anemia in chronic kidney disease: Secondary | ICD-10-CM | POA: Diagnosis not present

## 2021-11-15 DIAGNOSIS — E875 Hyperkalemia: Secondary | ICD-10-CM | POA: Diagnosis not present

## 2021-11-15 DIAGNOSIS — D689 Coagulation defect, unspecified: Secondary | ICD-10-CM | POA: Diagnosis not present

## 2021-11-17 DIAGNOSIS — D689 Coagulation defect, unspecified: Secondary | ICD-10-CM | POA: Diagnosis not present

## 2021-11-17 DIAGNOSIS — Z7682 Awaiting organ transplant status: Secondary | ICD-10-CM | POA: Diagnosis not present

## 2021-11-17 DIAGNOSIS — N186 End stage renal disease: Secondary | ICD-10-CM | POA: Diagnosis not present

## 2021-11-17 DIAGNOSIS — E875 Hyperkalemia: Secondary | ICD-10-CM | POA: Diagnosis not present

## 2021-11-17 DIAGNOSIS — Z992 Dependence on renal dialysis: Secondary | ICD-10-CM | POA: Diagnosis not present

## 2021-11-17 DIAGNOSIS — D509 Iron deficiency anemia, unspecified: Secondary | ICD-10-CM | POA: Diagnosis not present

## 2021-11-17 DIAGNOSIS — D631 Anemia in chronic kidney disease: Secondary | ICD-10-CM | POA: Diagnosis not present

## 2021-11-17 DIAGNOSIS — N2581 Secondary hyperparathyroidism of renal origin: Secondary | ICD-10-CM | POA: Diagnosis not present

## 2021-11-20 DIAGNOSIS — D689 Coagulation defect, unspecified: Secondary | ICD-10-CM | POA: Diagnosis not present

## 2021-11-20 DIAGNOSIS — D631 Anemia in chronic kidney disease: Secondary | ICD-10-CM | POA: Diagnosis not present

## 2021-11-20 DIAGNOSIS — Z7682 Awaiting organ transplant status: Secondary | ICD-10-CM | POA: Diagnosis not present

## 2021-11-20 DIAGNOSIS — D509 Iron deficiency anemia, unspecified: Secondary | ICD-10-CM | POA: Diagnosis not present

## 2021-11-20 DIAGNOSIS — E875 Hyperkalemia: Secondary | ICD-10-CM | POA: Diagnosis not present

## 2021-11-20 DIAGNOSIS — Z992 Dependence on renal dialysis: Secondary | ICD-10-CM | POA: Diagnosis not present

## 2021-11-20 DIAGNOSIS — N186 End stage renal disease: Secondary | ICD-10-CM | POA: Diagnosis not present

## 2021-11-20 DIAGNOSIS — N2581 Secondary hyperparathyroidism of renal origin: Secondary | ICD-10-CM | POA: Diagnosis not present

## 2021-11-22 DIAGNOSIS — D631 Anemia in chronic kidney disease: Secondary | ICD-10-CM | POA: Diagnosis not present

## 2021-11-22 DIAGNOSIS — N186 End stage renal disease: Secondary | ICD-10-CM | POA: Diagnosis not present

## 2021-11-22 DIAGNOSIS — D689 Coagulation defect, unspecified: Secondary | ICD-10-CM | POA: Diagnosis not present

## 2021-11-22 DIAGNOSIS — N2581 Secondary hyperparathyroidism of renal origin: Secondary | ICD-10-CM | POA: Diagnosis not present

## 2021-11-22 DIAGNOSIS — E875 Hyperkalemia: Secondary | ICD-10-CM | POA: Diagnosis not present

## 2021-11-22 DIAGNOSIS — Z992 Dependence on renal dialysis: Secondary | ICD-10-CM | POA: Diagnosis not present

## 2021-11-22 DIAGNOSIS — Z7682 Awaiting organ transplant status: Secondary | ICD-10-CM | POA: Diagnosis not present

## 2021-11-22 DIAGNOSIS — D509 Iron deficiency anemia, unspecified: Secondary | ICD-10-CM | POA: Diagnosis not present

## 2021-11-24 DIAGNOSIS — D689 Coagulation defect, unspecified: Secondary | ICD-10-CM | POA: Diagnosis not present

## 2021-11-24 DIAGNOSIS — Z7682 Awaiting organ transplant status: Secondary | ICD-10-CM | POA: Diagnosis not present

## 2021-11-24 DIAGNOSIS — D509 Iron deficiency anemia, unspecified: Secondary | ICD-10-CM | POA: Diagnosis not present

## 2021-11-24 DIAGNOSIS — Z992 Dependence on renal dialysis: Secondary | ICD-10-CM | POA: Diagnosis not present

## 2021-11-24 DIAGNOSIS — N186 End stage renal disease: Secondary | ICD-10-CM | POA: Diagnosis not present

## 2021-11-24 DIAGNOSIS — E875 Hyperkalemia: Secondary | ICD-10-CM | POA: Diagnosis not present

## 2021-11-24 DIAGNOSIS — D631 Anemia in chronic kidney disease: Secondary | ICD-10-CM | POA: Diagnosis not present

## 2021-11-24 DIAGNOSIS — N2581 Secondary hyperparathyroidism of renal origin: Secondary | ICD-10-CM | POA: Diagnosis not present

## 2021-11-27 DIAGNOSIS — D689 Coagulation defect, unspecified: Secondary | ICD-10-CM | POA: Diagnosis not present

## 2021-11-27 DIAGNOSIS — Z992 Dependence on renal dialysis: Secondary | ICD-10-CM | POA: Diagnosis not present

## 2021-11-27 DIAGNOSIS — Z7682 Awaiting organ transplant status: Secondary | ICD-10-CM | POA: Diagnosis not present

## 2021-11-27 DIAGNOSIS — D631 Anemia in chronic kidney disease: Secondary | ICD-10-CM | POA: Diagnosis not present

## 2021-11-27 DIAGNOSIS — N186 End stage renal disease: Secondary | ICD-10-CM | POA: Diagnosis not present

## 2021-11-27 DIAGNOSIS — N2581 Secondary hyperparathyroidism of renal origin: Secondary | ICD-10-CM | POA: Diagnosis not present

## 2021-11-27 DIAGNOSIS — E875 Hyperkalemia: Secondary | ICD-10-CM | POA: Diagnosis not present

## 2021-11-27 DIAGNOSIS — D509 Iron deficiency anemia, unspecified: Secondary | ICD-10-CM | POA: Diagnosis not present

## 2021-11-28 ENCOUNTER — Ambulatory Visit (INDEPENDENT_AMBULATORY_CARE_PROVIDER_SITE_OTHER): Payer: Medicare Other | Admitting: Cardiology

## 2021-11-28 ENCOUNTER — Encounter (HOSPITAL_BASED_OUTPATIENT_CLINIC_OR_DEPARTMENT_OTHER): Payer: Self-pay | Admitting: Cardiology

## 2021-11-28 VITALS — BP 90/62 | HR 100 | Ht 74.0 in | Wt 202.4 lb

## 2021-11-28 DIAGNOSIS — Z8673 Personal history of transient ischemic attack (TIA), and cerebral infarction without residual deficits: Secondary | ICD-10-CM | POA: Diagnosis not present

## 2021-11-28 DIAGNOSIS — Z992 Dependence on renal dialysis: Secondary | ICD-10-CM

## 2021-11-28 DIAGNOSIS — Z7682 Awaiting organ transplant status: Secondary | ICD-10-CM

## 2021-11-28 DIAGNOSIS — N186 End stage renal disease: Secondary | ICD-10-CM

## 2021-11-28 DIAGNOSIS — I358 Other nonrheumatic aortic valve disorders: Secondary | ICD-10-CM | POA: Diagnosis not present

## 2021-11-28 NOTE — Progress Notes (Signed)
Cardiology Office Note:    Date:  11/28/2021   ID:  Carl Found Sr., DOB [birth year], MRN [id number]  PCP:  [name], MD  Cardiologist:  Buford Dresser, MD  Referring MD: [name], MD   CC: follow up  History of Present Illness:    Carl Umbach Sr. is a 61 y.o. male with a hx of ESRD on dialysis, carotid artery plaque who is seen for follow-up. He was initially seen on 10/13/20 as a new consult at the request of Koirala, Dibas, MD for the evaluation and management of cardiovascular management pre-kidney transplant. He has not been seen in the practice since 2016 (Dr. Debara Pickett).  Cardiac history: He was told that there was something with his heart that got him kicked off the transplant list. Per note in Care Everywhere dated 09/30/19, "Called and informed that pt that he would not be a candidate for txp here d/t low BP and abnormal cardiac testing. Told that he could pursue txp with another center if he desired. He was disappointed with the decision but verbalized understanding. Eval closed appts cancelled and letter sent. 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 This is noted to be seen previously in 2018 and 2020.   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 Has had hypotension on dialysis in the past History of diabetes: has never been on medication, diet controlled History of CKD: yes, ESRD on dialysis History of OSA: yes, history in the past,  but has not needed CPAP since he lost a lot of weight History of anesthesia complications: none Current symptoms: Denies chest pain, shortness of breath at rest or with normal exertion. No PND, orthopnea, LE edema or unexpected weight gain. No syncope or palpitations.  Functional capacity: currently very limited due to leg pain, in a wheelchair today  Last appointment was for preoperative cardiovascular risk assessment prior to hip surgery in addition to his annual visit. Total hip arthroplasty  which was performed on 11/14/20 by Dr. Mardelle Matte.  Today, the patient states that he is hanging in there. He states that his blood pressure always runs low, but with no associated dizziness; it fluctuates on dialysis days. He says that his dialysis is going well and he hasn't had to shorten treatment. He has been referred to [facility] for the kidney transplant list.   He complains of tingling in his left arm after getting his blood drawn so often.  Also, a couple weeks ago he had a central chest pain that he thought was due to acid reflux which was well managed by tums and peptobismol. His symptoms lasted for a few days before resolving.  Of note, he presented to the hospital last month for concerns of GI bleed. He reports feeling much better now with no recurrent bleeding issues at this time.  He denies any palpitations, shortness of breath, or peripheral edema. No headaches, syncope, orthopnea, Fever, Chills or PND.  Past Medical History:  Diagnosis Date   Acute gastric ulcer with hemorrhage    Acute GI bleeding 07/31/2019  Acute pericarditis    Anemia    hx of   Anxiety    situational    Arthritis    on meds   Borderline diabetes    Cataract    bilateral sx   Chronic headaches    Depression    situational    Diverticulitis    ESRD (end stage renal disease) ([place])     On Renal Transplant List," [facility]; MWF" (10/23/2016)   GERD (gastroesophageal reflux disease)    with certain foods   GI  bleed    Hypertension    diet controlled   Parathyroid abnormality (HCC)    ectopic parathyroid gland   Presence of arteriovenous fistula for hemodialysis, primary ([place])    RUE PER PT RLE   Refusal of blood product    NO WHOLE BLOOD PROUCTS   Renal cell carcinoma ([place])    s/p hand assisted laparoscopic bilateral nephrectomies 11/29/17, + RCC left   Secondary hyperparathyroidism ([place])    Seizures ([place])    one episode in past, due to" elevated Potassium" 08/02/20- "at least 4 years ago"   Sleep apnea    doesn't use CPAP anymore since weight loss   Stroke [facility])    no residual    Past Surgical History:  Procedure Laterality Date   AV FISTULA PLACEMENT Right    right arm   BIOPSY  08/01/2019   Procedure: BIOPSY;  Surgeon: Juanita Craver, MD;  [facility] ENDOSCOPY;  Service: Endoscopy;;   BIOPSY  11/18/2020   Procedure: BIOPSY;  Surgeon: Daryel November, MD;  Location: [place];  Service: Gastroenterology;;   BIOPSY  09/13/2021   Procedure: BIOPSY;  Surgeon: Irving Copas., MD;  Location: [place];  Service: Gastroenterology;;   CATARACT EXTRACTION W/ INTRAOCULAR LENS  IMPLANT, BILATERAL     COLON SURGERY     COLONOSCOPY N/A 08/04/2015   Procedure: COLONOSCOPY;  Surgeon: Jerene Bears, MD;  Location: [facility] ENDOSCOPY;  Service: Endoscopy;  Laterality: N/A;   COLONOSCOPY  2017   JMP@ [facility]-good prep-mass -recall 1 yr   COLONOSCOPY N/A 09/13/2021   Procedure: COLONOSCOPY;  Surgeon: Mansouraty, Telford Nab., MD;  Location: [place];  Service: Gastroenterology;  Laterality: N/A;   COLONOSCOPY WITH PROPOFOL N/A 11/25/2020   Procedure: COLONOSCOPY WITH PROPOFOL;  Surgeon: Sharyn Creamer, MD;  Location: [place];  Service: Gastroenterology;  Laterality: N/A;   COLONOSCOPY WITH PROPOFOL N/A 09/23/2021   Procedure: COLONOSCOPY WITH PROPOFOL;  Surgeon: Lavena Bullion, DO;  Location: [place];  Service: Gastroenterology;  Laterality: N/A;   ENTEROSCOPY N/A 09/23/2021    Procedure: ENTEROSCOPY;  Surgeon: Lavena Bullion, DO;  Location: [place];  Service: Gastroenterology;  Laterality: N/A;  Will place order for video capsule study as we may opt to place it during procedure   ESOPHAGOGASTRODUODENOSCOPY N/A 08/01/2019   Procedure: ESOPHAGOGASTRODUODENOSCOPY (EGD);  Surgeon: Juanita Craver, MD;  Location: [facility] ENDOSCOPY;  Service: Endoscopy;  Laterality: N/A;   ESOPHAGOGASTRODUODENOSCOPY N/A 11/18/2020   Procedure: ESOPHAGOGASTRODUODENOSCOPY (EGD);  Surgeon: Daryel November, MD;  Location: [place];  Service: Gastroenterology;  Laterality: N/A;   ESOPHAGOGASTRODUODENOSCOPY (EGD) WITH PROPOFOL N/A 08/04/2019   Procedure: ESOPHAGOGASTRODUODENOSCOPY (EGD) WITH PROPOFOL;  Surgeon: Yetta Flock, MD;  Location: [place];  Service: Gastroenterology;  Laterality: N/A;   ESOPHAGOGASTRODUODENOSCOPY (EGD) WITH PROPOFOL N/A 09/13/2021   Procedure: ESOPHAGOGASTRODUODENOSCOPY (EGD) WITH PROPOFOL;  Surgeon: Rush Landmark Telford Nab., MD;  Location: [place];  Service: Gastroenterology;  Laterality: N/A;   GIVENS CAPSULE STUDY N/A 09/23/2021  Procedure: GIVENS CAPSULE STUDY;  Surgeon: Lavena Bullion, DO;  Location: [place];  Service: Gastroenterology;  Laterality: N/A;   graft left arm Left    for dialysis x 2. Removed   HOT HEMOSTASIS  11/18/2020   Procedure: HOT HEMOSTASIS (ARGON PLASMA COAGULATION/BICAP);  Surgeon: Daryel November, MD;  Location: [place];  Service: Gastroenterology;;   HOT HEMOSTASIS N/A 09/23/2021   Procedure: HOT HEMOSTASIS (ARGON PLASMA COAGULATION/BICAP);  Surgeon: Lavena Bullion, DO;  Location: [facility] ENDOSCOPY;  Service: Gastroenterology;  Laterality: N/A;   INSERTION OF DIALYSIS CATHETER     Rt chest   LAPAROSCOPIC RIGHT COLECTOMY N/A 08/05/2015   Procedure: LAPAROSCOPIC RIGHT COLECTOMY- ASCENDING;  Surgeon: Stark Klein, MD;  Location: [place];  Service: General;  Laterality: N/A;   MASS EXCISION Left 05/28/2019    Procedure: EXCISION SOFT TISSUE MASS LEFT SHOULDER;  Surgeon: Armandina Gemma, MD;  Location: [facility];  Service: General;  Laterality: Left;   NEPHRECTOMY Bilateral    PARATHYROIDECTOMY N/A 06/12/2016   Procedure: TOTAL PARATHYROIDECTOMY WITH AUTOTRANSPLANTATION TO LEFT FOREARM;  Surgeon: Armandina Gemma, MD;  Location: [place];  Service: General;  Laterality: N/A;   PARATHYROIDECTOMY N/A 10/23/2016   Procedure: PARATHYROIDECTOMY;  Surgeon: Armandina Gemma, MD;  Location: [place];  Service: General;  Laterality: N/A;   REVERSE SHOULDER ARTHROPLASTY Right 08/24/2020   Procedure: REVERSE SHOULDER ARTHROPLASTY;  Surgeon: Hiram Gash, MD;  Location: [place];  Service: Orthopedics;  Laterality: Right;   REVISON OF ARTERIOVENOUS FISTULA Right 07/16/2017   Procedure: REVISION OF ARTERIOVENOUS FISTULA  Right ARM;  Surgeon: Waynetta Sandy, MD;  Location: [place];  Service: Vascular;  Laterality: Right;   TOTAL HIP ARTHROPLASTY Left 11/14/2020   Procedure: LEFT TOTAL HIP ARTHROPLASTY ANTERIOR APPROACH;  Surgeon: Leandrew Koyanagi, MD;  Location: [place];  Service: Orthopedics;  Laterality: Left;   UPPER GASTROINTESTINAL ENDOSCOPY  2021   @ [facility]    Current Medications: Current Outpatient Medications on File Prior to Visit  Medication Sig   acetaminophen (TYLENOL) 650 MG CR tablet Take 1,300 mg by mouth every 8 (eight) hours as needed for pain.   amoxicillin (AMOXIL) 500 MG capsule Take 4 pills one hour prior to dental work   atorvastatin (LIPITOR) 10 MG tablet Take 10 mg by mouth 2 (two) times a week.   augmented betamethasone dipropionate (DIPROLENE-AF) 0.05 % ointment Apply 1 Application topically 2 (two) times daily as needed for rash.   cinacalcet (SENSIPAR) 60 MG tablet Take 120 mg by mouth daily with supper.   cromolyn (OPTICROM) 4 % ophthalmic solution Place 1 drop into both eyes daily as needed (redness).   ferrous sulfate 325 (65 FE) MG tablet Take 1 tablet once daily   folic acid (FOLVITE) 1 MG tablet  Take 1 tablet (1 mg total) by mouth daily.   gabapentin (NEURONTIN) 100 MG capsule Take 100 mg by mouth at bedtime.   LOKELMA 10 g PACK packet Take 1 packet by mouth daily.   Methoxy PEG-Epoetin Beta (MIRCERA IJ) Mircera   multivitamin (RENA-VIT) TABS tablet Take 1 tablet by mouth daily.   pantoprazole (PROTONIX) 40 MG tablet Take 1 tablet (40 mg total) by mouth 2 (two) times daily before a meal.   sevelamer carbonate (RENVELA) 800 MG tablet Take 4,000 mg by mouth 3 (three) times daily with meals. 5 tabs with meals   sucralfate (CARAFATE) 1 GM/10ML suspension Take 10 mLs (1 g total) by mouth 2 (two) times daily.   vitamin B-12 (CYANOCOBALAMIN)  1000 MCG tablet Take 1,000 mcg by mouth daily.   vitamin E 180 MG (400 UNITS) capsule Take 400 Units by mouth 3 (three) times a week.   No current facility-administered medications on file prior to visit.     Allergies:   Infed [iron dextran], Oxycodone, Vicodin [hydrocodone-acetaminophen], and Diclofenac   Social History   Tobacco Use   Smoking status: Former    Types: Cigarettes    Quit date: 06/19/1990    Years since quitting: 31.4   Smokeless tobacco: Never   Tobacco comments:    rarely cigar  Vaping Use   Vaping Use: Never used  Substance Use Topics   Alcohol use: Yes    Alcohol/week: 1.0 standard drink of alcohol    Types: 1 Cans of beer per week    Comment: occasional beer   Drug use: Not Currently    Types: Marijuana    Comment: Last use several yrs ago    Family History: family history includes Anuerysm in his brother; Diabetes in his father; Hypertension in his father and sister; Lupus in his sister; Stroke in his father and sister; Uterine cancer in his mother. There is no history of Colon cancer, Esophageal cancer, Stomach cancer, Pancreatic cancer, Liver disease, Colon polyps, or Rectal cancer.  ROS:   Please see the history of present illness.   (+) Acid Reflux (+) Tingling sensations of LUE Additional pertinent ROS  otherwise unremarkable.  EKGs/Labs/Other Studies Reviewed:    The following studies were reviewed today:  CTA GI Bleed 09/21/21: IMPRESSION: VASCULAR   Atherosclerosis of abdominal aorta is noted without aneurysm or dissection.   No definite evidence of contrast extravasation to suggest active gastrointestinal bleeding.   NON-VASCULAR   Status post bilateral nephrectomy.   Status post right hemicolectomy.   No definite acute abnormality seen in the abdomen or pelvis.  Exercise Stress Test 10/20/20:   The study is normal. The study is low risk.   No ST deviation was noted.   LV perfusion is normal. There is no evidence of ischemia. There is no evidence of infarction.   Left ventricular function is normal. Nuclear stress EF: 58 %. The left ventricular ejection fraction is normal (55-65%).   Prior study not available for comparison.   Reduced counts were noted in the inferior segments with normal wall motion consistent with diaphragm attenuation.  No evidence of ischemia or prior infarction.  This is a low risk study.  Echo 09/29/19 (Care Everywhere) SUMMARY  The left ventricular size is normal.  There is mild concentric left ventricular hypertrophy.  Left ventricular systolic function is normal.  LV ejection fraction = 55-60%.  No segmental wall motion abnormalities seen in the left ventricle  Left ventricular filling pattern is indeterminate.  The right ventricle is normal in size and function.  The left atrium is mildly to moderately dilated.  The aortic valve is trileaflet and opens well.  There is a 1.5 to 2 cm thin, filamentous, hypermobile echodensity  noted on the ventricular side of the aortic valve, likely representing  lambl's excrescence.  Estimated right ventricular systolic pressure is 20 mmHg.  No pulmonary hypertension.  The aortic sinus is normal size.  Mildly dilated ascending aorta.  IVC size was normal.  There is no pericardial effusion.  Compared to  prior study, probably no significant change, filamentous  structure visualized on aortic valve also visible in 2020 and 2018.  Clinical correlation required.   Stress echo 03/25/2018 (Care Everywhere) SUMMARY  The  patient had no chest pain during stress  The patient achieved 89 % of maximum predicted heart rate.  Negative stress ECG for inducible ischemia at target heart rate.  Negative dobutamine echocardiography for inducible ischemia at target heart  rate.  -  FINDINGS:  -  ECG REST  The baseline ECG displays normal sinus rhythm.  -  ECG STRESS  No diagnostic ST segment changes were seen. Negative stress ECG for inducible  ischemia at target heart rate. The stress protocol, dobutamine perfusion  time, ECG changes, blood pressure and heart rate responses are shown in the  Cardiology Scan portion of this report in EPIC.  -  REST ECHO  Normal left ventricular function at rest. There were no segmental wall motion  abnormalities at rest. The estimated LV ejection fraction is 55-60% .  -  LOW DOSE  There was normal augmentation of all left ventricular wall segments with  dobutamine. There were no segmental wall motion abnormalities with low dose  of dobutamine.  -  PEAK DOSE  Normal left ventricular function and global wall motion with stress. Global  LV function is preserved with stress. There were no segmental wall motion  abnormalities with dobutamine. There was normal augmentation of all left  ventricular wall segments with dobutamine. The estimated LV ejection fraction  is 65-70% with stress. Negative dobutamine echocardiography for inducible  ischemia at target heart rate.  -  -  WALL MOTION  -  Stress Results  Protocol: DobutamineMaximum Predicted HR: 161 bpm  Target HR: 137 bpm% Maximum Predicted HR: 89 %  StageDuration  (mm:ss) Heart Rate  (bpm) BPDose Comment  13:00 85 165/10010.00  23:00 95 162/9220.00  33:00 127 136/7830.00 '1mg'$  Atropine  42:25 144  120/7240.00 .2 Atropine  1R2:00 148 109/62  22:00 146 104/58  316:31 110 137/81  128/81  Stress Duration: 11:24 mm:ss *Recovery Time: 2:00 mm:ss  Maximum Stress HR: 144 bpm *   Bilateral Carotid Doppler 12/03/15: Summary:  Bilateral: intimal wall thickening CCA. Mild soft plaque origin  ICA. 1-39% ICA plaquing. Vertebral artery flow is antegrade.   Echo 10/03/15 - Left ventricle: The cavity size was normal. Wall thickness was    increased in a pattern of mild LVH. There was moderate focal    basal hypertrophy of the septum. Systolic function was normal.    The estimated ejection fraction was in the range of 60% to 65%.    Wall motion was normal; there were no regional wall motion    abnormalities. Doppler parameters are consistent with abnormal    left ventricular relaxation (grade 1 diastolic dysfunction). The    E/e&' ratio is <8, suggesting normal LV filling pressure.  - Left atrium: The atrium was mildly dilated.  - Right atrium: The atrium was mildly dilated.  - Inferior vena cava: The vessel was normal in size. The    respirophasic diameter changes were in the normal range (>= 50%),    consistent with normal central venous pressure.   Impressions:   - Compared to a prior study in 2016, the LVEF is higher at 60-65%.    There is mild LVH with moderate basal septal hypertrophy, normal    wall motion and mild biatrial enlargement.   EKG:  EKG is personally reviewed.   11/28/21: not ordered today 10/13/20 NSR, RBBB, 96 bpm  Recent Labs: 09/18/2021: ALT 11 09/27/2021: BUN 44; Creatinine, Ser 9.97; Potassium 4.9; Sodium 133 09/29/2021: Hemoglobin 5.7; Platelets 307  Recent Lipid Panel    Component  Value Date/Time   CHOL 157 03/08/2020 0831   TRIG 60 03/08/2020 0831   HDL 61 03/08/2020 0831   CHOLHDL 2.6 03/08/2020 0831   CHOLHDL 2.9 10/02/2015 0347   VLDL 13 10/02/2015 0347   LDLCALC 84 03/08/2020 0831    Physical Exam:    VS:  BP 90/62 (BP Location: Left Arm, Patient  Position: Sitting, Cuff Size: Normal)   Pulse 100   Ht '6\' 2"'$  (1.88 m)   Wt 202 lb 6.4 oz (91.8 kg)   SpO2 96%   BMI 25.99 kg/m     Wt Readings from Last 3 Encounters:  11/28/21 202 lb 6.4 oz (91.8 kg)  09/29/21 199 lb 4.7 oz (90.4 kg)  09/13/21 189 lb 6 oz (85.9 kg)    GEN: Well nourished, well developed in no acute distress HEENT: Normal, moist mucous membranes NECK: No JVD CARDIAC: regular rhythm, normal S1 and S2, no rubs or gallops. No murmur. VASCULAR: 1+ left radial pulse, 2+ right radial pulse, and DP pulses 2+ bilaterally. No carotid bruits RESPIRATORY:  Clear to auscultation without rales, wheezing or rhonchi  ABDOMEN: Soft, non-tender, non-distended MUSCULOSKELETAL:  Ambulates independently SKIN: Warm and dry, no edema NEUROLOGIC:  Alert and oriented x 3. No focal neuro deficits noted. PSYCHIATRIC:  Normal affect    ASSESSMENT:    1. ESRD on dialysis ([place])   2. History of lacunar cerebrovascular accident (CVA)   3. 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 Awaiting organ transplant status    PLAN:    Lambl's excrescence: noted on echo, with comments that it was also seen in 2018 and 2020. We reviewed his history of CVA. Based on imaging, this was not felt to be an embolic stroke, and it was not acute. Comments note lacunar infarct and chronic microvascular changes.  -we discussed that typically indication for resection of these is embolic stroke or recurrent stroke. He does not meet this criteria. -aspirin stopped due history of GI bleed -I am unclear why this would take him off of the transplant list, especially as it was noted in the past as well. I am uncertain if there is another reason based on his echo that he would no longer be a transplant candidate.  -nuclear stress test 2022 low risk, unremarkable  ESRD Dialysis-associated hypotension Was undergoing transplant evaluation at [place], now awaiting evaluation with [facility] in [place] Does not require  midodrine with dialysis any more  Cardiac risk counseling and prevention recommendations: -recommend heart healthy/Mediterranean diet, with whole grains, fruits, vegetable, fish, lean meats, nuts, and olive oil. Limit salt. -recommend moderate walking, 3-5 times/week for 30-50 minutes each session. Aim for at least 150 minutes.week. Goal should be pace of 3 miles/hours, or walking 1.5 miles in 30 minutes -recommend avoidance of tobacco products. Avoid excess alcohol.  Plan for follow up: 1 year or sooner as needed  Buford Dresser, MD, PhD [place]  [facility]    Medication Adjustments/Labs and Tests Ordered: Current medicines are reviewed at length with the patient today.  Concerns regarding medicines are outlined above.  No orders of the defined types were placed in this encounter.  No orders of the defined types were placed in this encounter.  Patient Instructions  Medication Instructions:  Your Physician recommend you continue on your current medication as directed.    *If you need a refill on your cardiac medications before your next appointment, please call your pharmacy*  Follow-Up: At [facility], you and your  health needs are our priority.  As part of our continuing mission to provide you with exceptional heart care, we have created designated Provider Care Teams.  These Care Teams include your primary Cardiologist (physician) and Advanced Practice Providers (APPs -  Physician Assistants and Nurse Practitioners) who all work together to provide you with the care you need, when you need it.  We recommend signing up for the patient portal called "MyChart".  Sign up information is provided on this After Visit Summary.  MyChart is used to connect with patients for Virtual Visits (Telemedicine).  Patients are able to view lab/test results, encounter notes, upcoming appointments, etc.  Non-urgent messages can be sent to your provider as well.   To learn more  about what you can do with MyChart, go to [email or web address].    Your next appointment:   Follow up in one year with Dr. Harrell Gave- We will send you a letter when it is time to schedule!     I,Coren O'Brien,acting as a Education administrator for [employer], MD.,have documented all relevant documentation on the behalf of Buford Dresser, MD,as directed by  Buford Dresser, MD while in the presence of Buford Dresser, MD.   I, Buford Dresser, MD, have reviewed all documentation for this visit. The documentation on 03/04/22 for the exam, diagnosis, procedures, and orders are all accurate and complete.   Signed, Buford Dresser, MD PhD 11/28/2021     [place]

## 2021-11-28 NOTE — Patient Instructions (Signed)
Medication Instructions:  Your Physician recommend you continue on your current medication as directed.    *If you need a refill on your cardiac medications before your next appointment, please call your pharmacy*  Follow-Up: At [facility], you and your health needs are our priority.  As part of our continuing mission to provide you with exceptional heart care, we have created designated Provider Care Teams.  These Care Teams include your primary Cardiologist (physician) and Advanced Practice Providers (APPs -  Physician Assistants and Nurse Practitioners) who all work together to provide you with the care you need, when you need it.  We recommend signing up for the patient portal called "MyChart".  Sign up information is provided on this After Visit Summary.  MyChart is used to connect with patients for Virtual Visits (Telemedicine).  Patients are able to view lab/test results, encounter notes, upcoming appointments, etc.  Non-urgent messages can be sent to your provider as well.   To learn more about what you can do with MyChart, go to [email or web address].    Your next appointment:   Follow up in one year with Dr. Harrell Gave- We will send you a letter when it is time to schedule!

## 2021-11-29 DIAGNOSIS — Z7682 Awaiting organ transplant status: Secondary | ICD-10-CM | POA: Diagnosis not present

## 2021-11-29 DIAGNOSIS — D689 Coagulation defect, unspecified: Secondary | ICD-10-CM | POA: Diagnosis not present

## 2021-11-29 DIAGNOSIS — N2581 Secondary hyperparathyroidism of renal origin: Secondary | ICD-10-CM | POA: Diagnosis not present

## 2021-11-29 DIAGNOSIS — D509 Iron deficiency anemia, unspecified: Secondary | ICD-10-CM | POA: Diagnosis not present

## 2021-11-29 DIAGNOSIS — E875 Hyperkalemia: Secondary | ICD-10-CM | POA: Diagnosis not present

## 2021-11-29 DIAGNOSIS — Z992 Dependence on renal dialysis: Secondary | ICD-10-CM | POA: Diagnosis not present

## 2021-11-29 DIAGNOSIS — D631 Anemia in chronic kidney disease: Secondary | ICD-10-CM | POA: Diagnosis not present

## 2021-11-29 DIAGNOSIS — N186 End stage renal disease: Secondary | ICD-10-CM | POA: Diagnosis not present

## 2021-12-01 DIAGNOSIS — N2581 Secondary hyperparathyroidism of renal origin: Secondary | ICD-10-CM | POA: Diagnosis not present

## 2021-12-01 DIAGNOSIS — Z7682 Awaiting organ transplant status: Secondary | ICD-10-CM | POA: Diagnosis not present

## 2021-12-01 DIAGNOSIS — D631 Anemia in chronic kidney disease: Secondary | ICD-10-CM | POA: Diagnosis not present

## 2021-12-01 DIAGNOSIS — K269 Duodenal ulcer, unspecified as acute or chronic, without hemorrhage or perforation: Secondary | ICD-10-CM | POA: Diagnosis not present

## 2021-12-01 DIAGNOSIS — R200 Anesthesia of skin: Secondary | ICD-10-CM | POA: Diagnosis not present

## 2021-12-01 DIAGNOSIS — Z992 Dependence on renal dialysis: Secondary | ICD-10-CM | POA: Diagnosis not present

## 2021-12-01 DIAGNOSIS — N186 End stage renal disease: Secondary | ICD-10-CM | POA: Diagnosis not present

## 2021-12-01 DIAGNOSIS — Z23 Encounter for immunization: Secondary | ICD-10-CM | POA: Diagnosis not present

## 2021-12-01 DIAGNOSIS — K2971 Gastritis, unspecified, with bleeding: Secondary | ICD-10-CM | POA: Diagnosis not present

## 2021-12-01 DIAGNOSIS — D689 Coagulation defect, unspecified: Secondary | ICD-10-CM | POA: Diagnosis not present

## 2021-12-01 DIAGNOSIS — E875 Hyperkalemia: Secondary | ICD-10-CM | POA: Diagnosis not present

## 2021-12-01 DIAGNOSIS — D509 Iron deficiency anemia, unspecified: Secondary | ICD-10-CM | POA: Diagnosis not present

## 2021-12-04 DIAGNOSIS — N186 End stage renal disease: Secondary | ICD-10-CM | POA: Diagnosis not present

## 2021-12-04 DIAGNOSIS — Z992 Dependence on renal dialysis: Secondary | ICD-10-CM | POA: Diagnosis not present

## 2021-12-04 DIAGNOSIS — D509 Iron deficiency anemia, unspecified: Secondary | ICD-10-CM | POA: Diagnosis not present

## 2021-12-04 DIAGNOSIS — D689 Coagulation defect, unspecified: Secondary | ICD-10-CM | POA: Diagnosis not present

## 2021-12-04 DIAGNOSIS — E875 Hyperkalemia: Secondary | ICD-10-CM | POA: Diagnosis not present

## 2021-12-04 DIAGNOSIS — N2581 Secondary hyperparathyroidism of renal origin: Secondary | ICD-10-CM | POA: Diagnosis not present

## 2021-12-04 DIAGNOSIS — Z7682 Awaiting organ transplant status: Secondary | ICD-10-CM | POA: Diagnosis not present

## 2021-12-04 DIAGNOSIS — D631 Anemia in chronic kidney disease: Secondary | ICD-10-CM | POA: Diagnosis not present

## 2021-12-06 DIAGNOSIS — E875 Hyperkalemia: Secondary | ICD-10-CM | POA: Diagnosis not present

## 2021-12-06 DIAGNOSIS — Z7682 Awaiting organ transplant status: Secondary | ICD-10-CM | POA: Diagnosis not present

## 2021-12-06 DIAGNOSIS — N2581 Secondary hyperparathyroidism of renal origin: Secondary | ICD-10-CM | POA: Diagnosis not present

## 2021-12-06 DIAGNOSIS — D689 Coagulation defect, unspecified: Secondary | ICD-10-CM | POA: Diagnosis not present

## 2021-12-06 DIAGNOSIS — Z992 Dependence on renal dialysis: Secondary | ICD-10-CM | POA: Diagnosis not present

## 2021-12-06 DIAGNOSIS — D631 Anemia in chronic kidney disease: Secondary | ICD-10-CM | POA: Diagnosis not present

## 2021-12-06 DIAGNOSIS — D509 Iron deficiency anemia, unspecified: Secondary | ICD-10-CM | POA: Diagnosis not present

## 2021-12-06 DIAGNOSIS — N186 End stage renal disease: Secondary | ICD-10-CM | POA: Diagnosis not present

## 2021-12-08 DIAGNOSIS — N2581 Secondary hyperparathyroidism of renal origin: Secondary | ICD-10-CM | POA: Diagnosis not present

## 2021-12-08 DIAGNOSIS — D689 Coagulation defect, unspecified: Secondary | ICD-10-CM | POA: Diagnosis not present

## 2021-12-08 DIAGNOSIS — D631 Anemia in chronic kidney disease: Secondary | ICD-10-CM | POA: Diagnosis not present

## 2021-12-08 DIAGNOSIS — E875 Hyperkalemia: Secondary | ICD-10-CM | POA: Diagnosis not present

## 2021-12-08 DIAGNOSIS — Z7682 Awaiting organ transplant status: Secondary | ICD-10-CM | POA: Diagnosis not present

## 2021-12-08 DIAGNOSIS — N186 End stage renal disease: Secondary | ICD-10-CM | POA: Diagnosis not present

## 2021-12-08 DIAGNOSIS — D509 Iron deficiency anemia, unspecified: Secondary | ICD-10-CM | POA: Diagnosis not present

## 2021-12-08 DIAGNOSIS — Z992 Dependence on renal dialysis: Secondary | ICD-10-CM | POA: Diagnosis not present

## 2021-12-11 DIAGNOSIS — D689 Coagulation defect, unspecified: Secondary | ICD-10-CM | POA: Diagnosis not present

## 2021-12-11 DIAGNOSIS — E875 Hyperkalemia: Secondary | ICD-10-CM | POA: Diagnosis not present

## 2021-12-11 DIAGNOSIS — Z992 Dependence on renal dialysis: Secondary | ICD-10-CM | POA: Diagnosis not present

## 2021-12-11 DIAGNOSIS — N2581 Secondary hyperparathyroidism of renal origin: Secondary | ICD-10-CM | POA: Diagnosis not present

## 2021-12-11 DIAGNOSIS — Z7682 Awaiting organ transplant status: Secondary | ICD-10-CM | POA: Diagnosis not present

## 2021-12-11 DIAGNOSIS — D509 Iron deficiency anemia, unspecified: Secondary | ICD-10-CM | POA: Diagnosis not present

## 2021-12-11 DIAGNOSIS — D631 Anemia in chronic kidney disease: Secondary | ICD-10-CM | POA: Diagnosis not present

## 2021-12-11 DIAGNOSIS — N186 End stage renal disease: Secondary | ICD-10-CM | POA: Diagnosis not present

## 2021-12-12 ENCOUNTER — Encounter: Payer: Self-pay | Admitting: Neurology

## 2021-12-12 DIAGNOSIS — N186 End stage renal disease: Secondary | ICD-10-CM | POA: Diagnosis not present

## 2021-12-12 DIAGNOSIS — Z992 Dependence on renal dialysis: Secondary | ICD-10-CM | POA: Diagnosis not present

## 2021-12-12 DIAGNOSIS — I12 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 stage renal disease: Secondary | ICD-10-CM | POA: Diagnosis not present

## 2021-12-13 DIAGNOSIS — N186 End stage renal disease: Secondary | ICD-10-CM | POA: Diagnosis not present

## 2021-12-13 DIAGNOSIS — Z7682 Awaiting organ transplant status: Secondary | ICD-10-CM | POA: Diagnosis not present

## 2021-12-13 DIAGNOSIS — Z992 Dependence on renal dialysis: Secondary | ICD-10-CM | POA: Diagnosis not present

## 2021-12-13 DIAGNOSIS — D631 Anemia in chronic kidney disease: Secondary | ICD-10-CM | POA: Diagnosis not present

## 2021-12-13 DIAGNOSIS — R519 Headache, unspecified: Secondary | ICD-10-CM | POA: Diagnosis not present

## 2021-12-13 DIAGNOSIS — E875 Hyperkalemia: Secondary | ICD-10-CM | POA: Diagnosis not present

## 2021-12-13 DIAGNOSIS — D689 Coagulation defect, unspecified: Secondary | ICD-10-CM | POA: Diagnosis not present

## 2021-12-13 DIAGNOSIS — N2581 Secondary hyperparathyroidism of renal origin: Secondary | ICD-10-CM | POA: Diagnosis not present

## 2021-12-15 DIAGNOSIS — D631 Anemia in chronic kidney disease: Secondary | ICD-10-CM | POA: Diagnosis not present

## 2021-12-15 DIAGNOSIS — Z7682 Awaiting organ transplant status: Secondary | ICD-10-CM | POA: Diagnosis not present

## 2021-12-15 DIAGNOSIS — N2581 Secondary hyperparathyroidism of renal origin: Secondary | ICD-10-CM | POA: Diagnosis not present

## 2021-12-15 DIAGNOSIS — N186 End stage renal disease: Secondary | ICD-10-CM | POA: Diagnosis not present

## 2021-12-15 DIAGNOSIS — E875 Hyperkalemia: Secondary | ICD-10-CM | POA: Diagnosis not present

## 2021-12-15 DIAGNOSIS — Z992 Dependence on renal dialysis: Secondary | ICD-10-CM | POA: Diagnosis not present

## 2021-12-15 DIAGNOSIS — R519 Headache, unspecified: Secondary | ICD-10-CM | POA: Diagnosis not present

## 2021-12-15 DIAGNOSIS — D689 Coagulation defect, unspecified: Secondary | ICD-10-CM | POA: Diagnosis not present

## 2021-12-18 DIAGNOSIS — N2581 Secondary hyperparathyroidism of renal origin: Secondary | ICD-10-CM | POA: Diagnosis not present

## 2021-12-18 DIAGNOSIS — E875 Hyperkalemia: Secondary | ICD-10-CM | POA: Diagnosis not present

## 2021-12-18 DIAGNOSIS — R519 Headache, unspecified: Secondary | ICD-10-CM | POA: Diagnosis not present

## 2021-12-18 DIAGNOSIS — Z7682 Awaiting organ transplant status: Secondary | ICD-10-CM | POA: Diagnosis not present

## 2021-12-18 DIAGNOSIS — D631 Anemia in chronic kidney disease: Secondary | ICD-10-CM | POA: Diagnosis not present

## 2021-12-18 DIAGNOSIS — D689 Coagulation defect, unspecified: Secondary | ICD-10-CM | POA: Diagnosis not present

## 2021-12-18 DIAGNOSIS — N186 End stage renal disease: Secondary | ICD-10-CM | POA: Diagnosis not present

## 2021-12-18 DIAGNOSIS — Z992 Dependence on renal dialysis: Secondary | ICD-10-CM | POA: Diagnosis not present

## 2021-12-19 DIAGNOSIS — M1612 Unilateral primary osteoarthritis, left hip: Secondary | ICD-10-CM | POA: Diagnosis not present

## 2021-12-19 DIAGNOSIS — G8929 Other chronic pain: Secondary | ICD-10-CM | POA: Diagnosis not present

## 2021-12-19 DIAGNOSIS — E7800 Pure hypercholesterolemia, unspecified: Secondary | ICD-10-CM | POA: Diagnosis not present

## 2021-12-19 DIAGNOSIS — N186 End stage renal disease: Secondary | ICD-10-CM | POA: Diagnosis not present

## 2021-12-20 DIAGNOSIS — N2581 Secondary hyperparathyroidism of renal origin: Secondary | ICD-10-CM | POA: Diagnosis not present

## 2021-12-20 DIAGNOSIS — D631 Anemia in chronic kidney disease: Secondary | ICD-10-CM | POA: Diagnosis not present

## 2021-12-20 DIAGNOSIS — H17822 Peripheral opacity of cornea, left eye: Secondary | ICD-10-CM | POA: Diagnosis not present

## 2021-12-20 DIAGNOSIS — Z7682 Awaiting organ transplant status: Secondary | ICD-10-CM | POA: Diagnosis not present

## 2021-12-20 DIAGNOSIS — H53461 Homonymous bilateral field defects, right side: Secondary | ICD-10-CM | POA: Diagnosis not present

## 2021-12-20 DIAGNOSIS — Z992 Dependence on renal dialysis: Secondary | ICD-10-CM | POA: Diagnosis not present

## 2021-12-20 DIAGNOSIS — N186 End stage renal disease: Secondary | ICD-10-CM | POA: Diagnosis not present

## 2021-12-20 DIAGNOSIS — D689 Coagulation defect, unspecified: Secondary | ICD-10-CM | POA: Diagnosis not present

## 2021-12-20 DIAGNOSIS — H47323 Drusen of optic disc, bilateral: Secondary | ICD-10-CM | POA: Diagnosis not present

## 2021-12-20 DIAGNOSIS — R7309 Other abnormal glucose: Secondary | ICD-10-CM | POA: Diagnosis not present

## 2021-12-20 DIAGNOSIS — E875 Hyperkalemia: Secondary | ICD-10-CM | POA: Diagnosis not present

## 2021-12-20 DIAGNOSIS — R519 Headache, unspecified: Secondary | ICD-10-CM | POA: Diagnosis not present

## 2021-12-22 DIAGNOSIS — D631 Anemia in chronic kidney disease: Secondary | ICD-10-CM | POA: Diagnosis not present

## 2021-12-22 DIAGNOSIS — R519 Headache, unspecified: Secondary | ICD-10-CM | POA: Diagnosis not present

## 2021-12-22 DIAGNOSIS — N2581 Secondary hyperparathyroidism of renal origin: Secondary | ICD-10-CM | POA: Diagnosis not present

## 2021-12-22 DIAGNOSIS — N186 End stage renal disease: Secondary | ICD-10-CM | POA: Diagnosis not present

## 2021-12-22 DIAGNOSIS — Z7682 Awaiting organ transplant status: Secondary | ICD-10-CM | POA: Diagnosis not present

## 2021-12-22 DIAGNOSIS — D689 Coagulation defect, unspecified: Secondary | ICD-10-CM | POA: Diagnosis not present

## 2021-12-22 DIAGNOSIS — Z992 Dependence on renal dialysis: Secondary | ICD-10-CM | POA: Diagnosis not present

## 2021-12-22 DIAGNOSIS — E875 Hyperkalemia: Secondary | ICD-10-CM | POA: Diagnosis not present

## 2021-12-25 DIAGNOSIS — N186 End stage renal disease: Secondary | ICD-10-CM | POA: Diagnosis not present

## 2021-12-25 DIAGNOSIS — D689 Coagulation defect, unspecified: Secondary | ICD-10-CM | POA: Diagnosis not present

## 2021-12-25 DIAGNOSIS — R519 Headache, unspecified: Secondary | ICD-10-CM | POA: Diagnosis not present

## 2021-12-25 DIAGNOSIS — Z7682 Awaiting organ transplant status: Secondary | ICD-10-CM | POA: Diagnosis not present

## 2021-12-25 DIAGNOSIS — N2581 Secondary hyperparathyroidism of renal origin: Secondary | ICD-10-CM | POA: Diagnosis not present

## 2021-12-25 DIAGNOSIS — D631 Anemia in chronic kidney disease: Secondary | ICD-10-CM | POA: Diagnosis not present

## 2021-12-25 DIAGNOSIS — Z992 Dependence on renal dialysis: Secondary | ICD-10-CM | POA: Diagnosis not present

## 2021-12-25 DIAGNOSIS — E875 Hyperkalemia: Secondary | ICD-10-CM | POA: Diagnosis not present

## 2021-12-26 ENCOUNTER — Telehealth: Payer: Self-pay | Admitting: *Deleted

## 2021-12-26 ENCOUNTER — Ambulatory Visit (INDEPENDENT_AMBULATORY_CARE_PROVIDER_SITE_OTHER): Payer: Medicare Other | Admitting: Orthopaedic Surgery

## 2021-12-26 ENCOUNTER — Ambulatory Visit (INDEPENDENT_AMBULATORY_CARE_PROVIDER_SITE_OTHER): Payer: Medicare Other

## 2021-12-26 DIAGNOSIS — Z96642 Presence of left artificial hip joint: Secondary | ICD-10-CM | POA: Diagnosis not present

## 2021-12-26 DIAGNOSIS — M25551 Pain in right hip: Secondary | ICD-10-CM | POA: Diagnosis not present

## 2021-12-26 NOTE — Progress Notes (Signed)
Office Visit Note   Patient: Carl Gassett Sr.           Date of Birth: [birth year]           MRN: [id number] Visit Date: 12/26/2021              Requested by: [name], MD [place],   [id number] PCP: [name], MD   Assessment & Plan: Visit Diagnoses:  1. Status post total hip replacement, left   2. Pain in right hip     Plan: In regards to the knees treatment options were explained and he is not interested in cortisone injections as these have not been effective in the past.  He would like to try custom arch supports for his feet to hopefully help realign the knees.  He is diabetic.  In regards to the right hip treatment options were explained and he wishes to right total hip replacement for sometime in January.  We will get the necessary clearance from PCP.  He dialyzes every Tuesday Thursday Saturday.  We will schedule surgery on a day that we will work with his dialysis schedule.  His last A1c in July was in the fives.  He is scheduled to repeat this soon.  Follow-Up Instructions: No follow-ups on file.   Orders:  Orders Placed This Encounter  Procedures   XR KNEE 3 VIEW RIGHT   XR HIP UNILAT W OR W/O PELVIS 2-3 VIEWS RIGHT   No orders of the defined types were placed in this encounter.     Procedures: No procedures performed   Clinical Data: No additional findings.   Subjective: Chief Complaint  Patient presents with   Right Hip - Pain   Right Knee - Pain    HPI [name] is here today for evaluation of chronic bilateral knee and right hip pain.  He has known advanced osteoarthritis in the knees and her right hip.  He has been very pleased with his left hip replacement.  He has not gotten any relief from prior cortisone injections in his right knee.  His right hip continues to affect daily activities and wakes him up at night.  Review of Systems  Constitutional: Negative.   All other systems reviewed and are  negative.    Objective: Vital Signs: There were no vitals taken for this visit.  Physical Exam Vitals and nursing note reviewed.  Constitutional:      Appearance: He is well-developed.  HENT:     Head: Normocephalic and atraumatic.  Eyes:     Pupils: Pupils are equal, round, and reactive to light.  Pulmonary:     Effort: Pulmonary effort is normal.  Abdominal:     Palpations: Abdomen is soft.  Musculoskeletal:        General: Normal range of motion.     Cervical back: Neck supple.  Skin:    General: Skin is warm.  Neurological:     Mental Status: He is alert and oriented to person, place, and time.  Psychiatric:        Behavior: Behavior normal.        Thought Content: Thought content normal.        Judgment: Judgment normal.     Ortho Exam Examination of bilateral knees show valgus alignment.  He has pronation of his feet as a result of the knee alignment. Examination right hip shows pain with logroll and flexion past 40 degrees.  No trochanteric tenderness. Specialty  Comments:  No specialty comments available.  Imaging: XR HIP UNILAT W OR W/O PELVIS 2-3 VIEWS RIGHT  Result Date: 12/26/2021 2 view x-rays of the right hip show advanced osteoarthritis that is bone-on-bone.  Stable left total hip replacement without any complication.  Large subchondral cyst of the femoral head.  XR KNEE 3 VIEW RIGHT  Result Date: 12/26/2021 Three-view x-rays of the right knee show advanced tricompartmental osteoarthritis.  Lateral compartment is bone-on-bone.  Valgus deformity.    PMFS History: Patient Active Problem List   Diagnosis Date Noted   Duodenitis    Gastritis and gastroduodenitis    Ischemic colitis ([place])    Bright red blood per rectum    ABLA (acute blood loss anemia)    Gastric ulcer    Duodenal ulcer    Diverticulosis of colon without hemorrhage    GI bleed 09/17/2021   Acute upper GI bleeding 09/11/2021   Mucosal abnormality of esophagus    Mucosal  abnormality of stomach    Mucosal abnormality of duodenum    Upper GI bleed    Left hip pain 11/16/2020   Type 2 diabetes mellitus with other specified complication ([place]) 13/24/4010   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/14/2020   Primary osteoarthritis of left hip 10/07/2020   Status post reverse total arthroplasty of right shoulder 08/24/2020   Diverticulitis 03/08/2020   ESRD (end stage renal disease) ([place]) 03/08/2020   Polyneuropathy in diseases classified elsewhere ([place]) 03/08/2020   Pure hypercholesterolemia 03/08/2020   Sacral back pain 03/08/2020   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 11/10/2019   Acute gastric ulcer with hemorrhage    Acute GI bleeding 07/31/2019   Hypotension of hemodialysis 07/31/2019   Diverticulitis large intestine 07/31/2019   Mass of soft tissue of shoulder 05/26/2019   Awaiting organ transplant status 03/18/2019   Finding of cocaine in blood 02/25/2019   Anaphylactic shock, unspecified, sequela 11/21/2018   Hypercalcemia 10/28/2018   Diarrhea, unspecified 08/08/2018   Hyperkalemia 03/12/2018   Renal cell carcinoma of left kidney ([place]) 12/24/2017   Fluid overload, unspecified 07/06/2017   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   HLD (hyperlipidemia)    PRES (posterior reversible encephalopathy syndrome)    History of lacunar cerebrovascular accident (CVA) 05/23/2014   Anemia 05/23/2014   GERD (gastroesophageal reflux disease) 12/09/2012   Gastrointestinal bleeding 11/12/2012   Melena 11/12/2012   Other psychoactive substance dependence, uncomplicated ([place]) 27/25/3664   Iron deficiency anemia, unspecified 06/10/2012   Moderate protein-calorie malnutrition ([place]) 07/29/2009   Coagulation defect, unspecified ([place]) 08/29/2006   Type 2 diabetes mellitus with diabetic peripheral angiopathy without gangrene ([place]) 08/29/2006   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 stage renal  disease ([place]) 08/02/2006   Past Medical History:  Diagnosis Date   Acute gastric ulcer with hemorrhage    Acute GI bleeding 07/31/2019   Acute pericarditis    Anemia    hx of   Anxiety    situational    Arthritis    on meds   Borderline diabetes    Cataract    bilateral sx   Chronic headaches    Depression    situational    Diverticulitis    ESRD (end stage renal disease) ([place])     On Renal Transplant List," [facility]; MWF" (10/23/2016)   GERD (gastroesophageal reflux disease)    with certain foods   GI bleed    Hypertension    diet controlled   Parathyroid abnormality (HCC)    ectopic parathyroid gland   Presence of arteriovenous fistula for hemodialysis, primary ([place])    RUE PER PT RLE   Refusal of blood product    NO WHOLE BLOOD PROUCTS   Renal cell carcinoma (HCC)    s/p hand assisted laparoscopic bilateral nephrectomies 11/29/17, + RCC left   Secondary hyperparathyroidism ([place])    Seizures ([place])    one episode in past, due to" elevated Potassium" 08/02/20- "at least 4 years ago"   Sleep apnea    doesn't use CPAP anymore since weight loss   Stroke ([place])    no residual    Family History  Problem Relation Age of Onset   Diabetes Father     Stroke Father    Hypertension Father    Uterine cancer Mother    Lupus Sister    Stroke Sister    Hypertension Sister    Anuerysm Brother        brain   Colon cancer Neg Hx    Esophageal cancer Neg Hx    Stomach cancer Neg Hx    Pancreatic cancer Neg Hx    Liver disease Neg Hx    Colon polyps Neg Hx    Rectal cancer Neg Hx     Past Surgical History:  Procedure Laterality Date   AV FISTULA PLACEMENT Right    right arm   BIOPSY  08/01/2019   Procedure: BIOPSY;  Surgeon: Juanita Craver, MD;  Location: [facility] ENDOSCOPY;  Service: Endoscopy;;   BIOPSY  11/18/2020   Procedure: BIOPSY;  Surgeon: Daryel November, MD;  Location: [place];  Service: Gastroenterology;;   BIOPSY  09/13/2021   Procedure: BIOPSY;  Surgeon: Irving Copas., MD;  Location: [facility] ENDOSCOPY;  Service: Gastroenterology;;   CATARACT EXTRACTION W/ INTRAOCULAR LENS  IMPLANT, BILATERAL     COLON SURGERY     COLONOSCOPY N/A 08/04/2015   Procedure: COLONOSCOPY;  Surgeon: Jerene Bears, MD;  Location: [facility] ENDOSCOPY;  Service: Endoscopy;  Laterality: N/A;   COLONOSCOPY  2017   JMP@ [facility]-good prep-mass -recall 1 yr   COLONOSCOPY N/A 09/13/2021   Procedure: COLONOSCOPY;  Surgeon: Mansouraty, Telford Nab., MD;  Location: [place];  Service: Gastroenterology;  Laterality: N/A;   COLONOSCOPY WITH PROPOFOL N/A 11/25/2020   Procedure: COLONOSCOPY WITH PROPOFOL;  Surgeon: Sharyn Creamer, MD;  Location: [place];  Service: Gastroenterology;  Laterality: N/A;   COLONOSCOPY WITH PROPOFOL N/A 09/23/2021   Procedure: COLONOSCOPY WITH PROPOFOL;  Surgeon: Lavena Bullion, DO;  Location: [place];  Service: Gastroenterology;  Laterality: N/A;   ENTEROSCOPY N/A 09/23/2021   Procedure: ENTEROSCOPY;  Surgeon: Lavena Bullion, DO;  Location: [place];  Service: Gastroenterology;  Laterality: N/A;  Will place  order for video capsule study as we may opt to place it during procedure   ESOPHAGOGASTRODUODENOSCOPY N/A  08/01/2019   Procedure: ESOPHAGOGASTRODUODENOSCOPY (EGD);  Surgeon: Juanita Craver, MD;  Location[facility] ENDOSCOPY;  Service: Endoscopy;  Laterality: N/A;   ESOPHAGOGASTRODUODENOSCOPY N/A 11/18/2020   Procedure: ESOPHAGOGASTRODUODENOSCOPY (EGD);  Surgeon: Daryel November, MD;  Location: [place];  Service: Gastroenterology;  Laterality: N/A;   ESOPHAGOGASTRODUODENOSCOPY (EGD) WITH PROPOFOL N/A 08/04/2019   Procedure: ESOPHAGOGASTRODUODENOSCOPY (EGD) WITH PROPOFOL;  Surgeon: Yetta Flock, MD;  Location: [place];  Service: Gastroenterology;  Laterality: N/A;   ESOPHAGOGASTRODUODENOSCOPY (EGD) WITH PROPOFOL N/A 09/13/2021   Procedure: ESOPHAGOGASTRODUODENOSCOPY (EGD) WITH PROPOFOL;  Surgeon: Rush Landmark Telford Nab., MD;  Location: [place];  Service: Gastroenterology;  Laterality: N/A;   GIVENS CAPSULE STUDY N/A 09/23/2021   Procedure: GIVENS CAPSULE STUDY;  Surgeon: Lavena Bullion, DO;  Location: [place];  Service: Gastroenterology;  Laterality: N/A;   graft left arm Left    for dialysis x 2. Removed   HOT HEMOSTASIS  11/18/2020   Procedure: HOT HEMOSTASIS (ARGON PLASMA COAGULATION/BICAP);  Surgeon: Daryel November, MD;  Location: [place];  Service: Gastroenterology;;   HOT HEMOSTASIS N/A 09/23/2021   Procedure: HOT HEMOSTASIS (ARGON PLASMA COAGULATION/BICAP);  Surgeon: Lavena Bullion, DO;  Location: [facility] ENDOSCOPY;  Service: Gastroenterology;  Laterality: N/A;   INSERTION OF DIALYSIS CATHETER     Rt chest   LAPAROSCOPIC RIGHT COLECTOMY N/A 08/05/2015   Procedure: LAPAROSCOPIC RIGHT COLECTOMY- ASCENDING;  Surgeon: Stark Klein, MD;  Location: [place];  Service: General;  Laterality: N/A;   MASS EXCISION Left 05/28/2019   Procedure: EXCISION SOFT TISSUE MASS LEFT SHOULDER;  Surgeon: Armandina Gemma, MD;  Location: [facility];  Service: General;  Laterality: Left;   NEPHRECTOMY Bilateral    PARATHYROIDECTOMY N/A 06/12/2016   Procedure: TOTAL PARATHYROIDECTOMY WITH  AUTOTRANSPLANTATION TO LEFT FOREARM;  Surgeon: Armandina Gemma, MD;  Location: [place];  Service: General;  Laterality: N/A;   PARATHYROIDECTOMY N/A 10/23/2016   Procedure: PARATHYROIDECTOMY;  Surgeon: Armandina Gemma, MD;  Location: [place];  Service: General;  Laterality: N/A;   REVERSE SHOULDER ARTHROPLASTY Right 08/24/2020   Procedure: REVERSE SHOULDER ARTHROPLASTY;  Surgeon: Hiram Gash, MD;  Location: [place];  Service: Orthopedics;  Laterality: Right;   REVISON OF ARTERIOVENOUS FISTULA Right 07/16/2017   Procedure: REVISION OF ARTERIOVENOUS FISTULA  Right ARM;  Surgeon: Waynetta Sandy, MD;  Location: [place];  Service: Vascular;  Laterality: Right;   TOTAL HIP ARTHROPLASTY Left 11/14/2020   Procedure: LEFT TOTAL HIP ARTHROPLASTY ANTERIOR APPROACH;  Surgeon: Leandrew Koyanagi, MD;  Location: [place];  Service: Orthopedics;  Laterality: Left;   UPPER GASTROINTESTINAL ENDOSCOPY  2021   @ [facility]   Social History   Occupational History   Occupation: retired    Fish farm manager: SELF EMPLOYED  Tobacco Use   Smoking status: Former    Types: Cigarettes    Quit date: 06/19/1990    Years since quitting: 31.5   Smokeless tobacco: Never   Tobacco comments:    rarely cigar  Vaping Use   Vaping Use: Never used  Substance and Sexual Activity   Alcohol use: Yes    Alcohol/week: 1.0 standard drink of alcohol    Types: 1 Cans of beer per week    Comment: occasional beer   Drug use: Not Currently    Types: Marijuana    Comment: Last use several yrs ago   Sexual activity: Not Currently

## 2021-12-26 NOTE — Telephone Encounter (Signed)
1 year Ortho bundle call completed. ?

## 2021-12-26 NOTE — Progress Notes (Deleted)
Initial neurology clinic note  SERVICE DATE: 12/28/21  Reason for Evaluation: Consultation requested by [name], MD for an opinion regarding numbness and tingling of left hand***. My final recommendations will be communicated back to the requesting physician by way of shared medical record or letter to requesting physician via [place] mail.  HPI: This is [name], a 61 y.o. ***-handed male with a medical history of HLD, iron deficiency anemia, ESRD on dialysis, duodenal ulcer disease, OA*** who presents to neurology clinic with the chief complaint of ***. The patient is accompanied by ***.  *** Left hand numbness and tingling - CTS vs cervical radic?  Gabapentin 100 mg daily? On topirimate? Percocet? B12 1000 mcg daily?  The patient has not*** had similar episodes of symptoms in the past. ***  Muscle bulk loss? *** Muscle pain? ***  Cramps/Twitching? *** Suggestion of myotonia/difficulty relaxing after contraction? ***  Fatigable weakness?*** Does strength improve after brief exercise?***  Able to brush hair/teeth without difficulty? *** Able to button shirts/use zips? *** Clumsiness/dropping grasped objects?*** Can you arise from squatted position easily? *** Able to get out of chair without using arms? *** Able to walk up steps easily? *** Use an assistive device to walk? *** Significant imbalance with walking? *** Falls?*** Any change in urine color, especially after exertion/physical activity? ***  The patient denies*** symptoms suggestive of oculobulbar weakness including diplopia, ptosis, dysphagia, poor saliva control, dysarthria/dysphonia, impaired mastication, facial weakness/droop.  There are no*** neuromuscular respiratory weakness symptoms, particularly orthopnea>dyspnea.   Pseudobulbar affect is absent***.  The patient does not*** report symptoms referable to autonomic dysfunction including impaired sweating, heat or cold intolerance, excessive  mucosal dryness, gastroparetic early satiety, postprandial abdominal bloating, constipation, bowel or bladder dyscontrol, erectile dysfunction*** or syncope/presyncope/orthostatic intolerance.  There are no*** complaints relating to other symptoms of small fiber modalities including paresthesia/pain.  The patient has not *** noticed any recent skin rashes nor does he*** report any constitutional symptoms like fever, night sweats, anorexia or unintentional weight loss.  EtOH use: ***  Restrictive diet? *** Family history of neuropathy/myopathy/NM disease?***  Previous labs, electrodiagnostics, and neuroimaging are summarized below, but pertinent findings include***  Any biopsy done? *** Current medications being tried for the patient's symptoms include ***  Prior medications that have been tried: ***   MEDICATIONS:  Outpatient Encounter Medications as of 12/28/2021  Medication Sig Note   acetaminophen (TYLENOL) 650 MG CR tablet Take 1,300 mg by mouth every 8 (eight) hours as needed for pain.    amoxicillin (AMOXIL) 500 MG capsule Take 4 pills one hour prior to dental work    atorvastatin (LIPITOR) 10 MG tablet Take 10 mg by mouth 2 (two) times a week.    augmented betamethasone dipropionate (DIPROLENE-AF) 0.05 % ointment Apply 1 Application topically 2 (two) times daily as needed for rash.    cinacalcet (SENSIPAR) 60 MG tablet Take 120 mg by mouth daily with supper.    cromolyn (OPTICROM) 4 % ophthalmic solution Place 1 drop into both eyes daily as needed (redness).    ferrous sulfate 325 (65 FE) MG tablet Take 1 tablet once daily    folic acid (FOLVITE) 1 MG tablet Take 1 tablet (1 mg total) by mouth daily.    gabapentin (NEURONTIN) 100 MG capsule Take 100 mg by mouth at bedtime.    LOKELMA 10 g PACK packet Take 1 packet by mouth daily.    Methoxy PEG-Epoetin Beta (MIRCERA IJ) Mircera 09/11/2021: Given at Dialysis  multivitamin (RENA-VIT) TABS tablet Take 1 tablet by mouth daily.     pantoprazole (PROTONIX) 40 MG tablet Take 1 tablet (40 mg total) by mouth 2 (two) times daily before a meal.    sevelamer carbonate (RENVELA) 800 MG tablet Take 4,000 mg by mouth 3 (three) times daily with meals. 5 tabs with meals    sucralfate (CARAFATE) 1 GM/10ML suspension Take 10 mLs (1 g total) by mouth 2 (two) times daily.    vitamin B-12 (CYANOCOBALAMIN) 1000 MCG tablet Take 1,000 mcg by mouth daily.    vitamin E 180 MG (400 UNITS) capsule Take 400 Units by mouth 3 (three) times a week.    No facility-administered encounter medications on file as of 12/28/2021.    PAST MEDICAL HISTORY: Past Medical History:  Diagnosis Date   Acute gastric ulcer with hemorrhage    Acute GI bleeding 07/31/2019   Acute pericarditis    Anemia    hx of   Anxiety    situational    Arthritis    on meds   Borderline diabetes    Cataract    bilateral sx   Chronic headaches    Depression    situational    Diverticulitis    ESRD (end stage renal disease) ([place])     On Renal Transplant List," [facility]; MWF" (10/23/2016)   GERD (gastroesophageal reflux disease)    with certain foods   GI bleed    Hypertension    diet controlled   Parathyroid abnormality (HCC)    ectopic parathyroid gland   Presence of arteriovenous fistula for hemodialysis, primary ([place])    RUE PER PT RLE   Refusal of blood product    NO WHOLE BLOOD PROUCTS   Renal cell carcinoma ([place])    s/p hand assisted laparoscopic bilateral nephrectomies 11/29/17, + RCC left   Secondary hyperparathyroidism ([place])    Seizures ([place])    one episode in past, due to" elevated Potassium" 08/02/20- "at least 4 years ago"   Sleep apnea    doesn't use CPAP anymore since weight loss   Stroke ([place])    no residual    PAST SURGICAL HISTORY: Past Surgical History:  Procedure Laterality Date   AV FISTULA PLACEMENT Right    right arm   BIOPSY  08/01/2019   Procedure: BIOPSY;  Surgeon: Juanita Craver, MD;  Location: [facility] ENDOSCOPY;  Service:  Endoscopy;;   BIOPSY  11/18/2020   Procedure: BIOPSY;  Surgeon: Daryel November, MD;  Location: [place];  Service: Gastroenterology;;   BIOPSY  09/13/2021   Procedure: BIOPSY;  Surgeon: Irving Copas., MD;  Location: [place];  Service: Gastroenterology;;   CATARACT EXTRACTION W/ INTRAOCULAR LENS  IMPLANT, BILATERAL     COLON SURGERY     COLONOSCOPY N/A 08/04/2015   Procedure: COLONOSCOPY;  Surgeon: Jerene Bears, MD;  Location: [facility] ENDOSCOPY;  Service: Endoscopy;  Laterality: N/A;   COLONOSCOPY  2017   JMP@ [facility]-good prep-mass -recall 1 yr   COLONOSCOPY N/A 09/13/2021   Procedure: COLONOSCOPY;  Surgeon: Mansouraty, Telford Nab., MD;  Location: [place];  Service: Gastroenterology;  Laterality: N/A;   COLONOSCOPY WITH PROPOFOL N/A 11/25/2020   Procedure: COLONOSCOPY WITH PROPOFOL;  Surgeon: Sharyn Creamer, MD;  Location: [place];  Service: Gastroenterology;  Laterality: N/A;   COLONOSCOPY WITH PROPOFOL N/A 09/23/2021   Procedure: COLONOSCOPY WITH PROPOFOL;  Surgeon: Lavena Bullion, DO;  Location: [place];  Service: Gastroenterology;  Laterality: N/A;   ENTEROSCOPY N/A 09/23/2021  Procedure: ENTEROSCOPY;  Surgeon: Lavena Bullion, DO;  Location: [place];  Service: Gastroenterology;  Laterality: N/A;  Will place order for video capsule study as we may opt to place it during procedure   ESOPHAGOGASTRODUODENOSCOPY N/A 08/01/2019   Procedure: ESOPHAGOGASTRODUODENOSCOPY (EGD);  Surgeon: Juanita Craver, MD;  Location: [facility] ENDOSCOPY;  Service: Endoscopy;  Laterality: N/A;   ESOPHAGOGASTRODUODENOSCOPY N/A 11/18/2020   Procedure: ESOPHAGOGASTRODUODENOSCOPY (EGD);  Surgeon: Daryel November, MD;  Location: [place];  Service: Gastroenterology;  Laterality: N/A;   ESOPHAGOGASTRODUODENOSCOPY (EGD) WITH PROPOFOL N/A 08/04/2019   Procedure: ESOPHAGOGASTRODUODENOSCOPY (EGD) WITH PROPOFOL;  Surgeon: Yetta Flock, MD;  Location: [place];  Service:  Gastroenterology;  Laterality: N/A;   ESOPHAGOGASTRODUODENOSCOPY (EGD) WITH PROPOFOL N/A 09/13/2021   Procedure: ESOPHAGOGASTRODUODENOSCOPY (EGD) WITH PROPOFOL;  Surgeon: Rush Landmark Telford Nab., MD;  Location: [place];  Service: Gastroenterology;  Laterality: N/A;   GIVENS CAPSULE STUDY N/A 09/23/2021   Procedure: GIVENS CAPSULE STUDY;  Surgeon: Lavena Bullion, DO;  Location: [place];  Service: Gastroenterology;  Laterality: N/A;   graft left arm Left    for dialysis x 2. Removed   HOT HEMOSTASIS  11/18/2020   Procedure: HOT HEMOSTASIS (ARGON PLASMA COAGULATION/BICAP);  Surgeon: Daryel November, MD;  Location: [place];  Service: Gastroenterology;;   HOT HEMOSTASIS N/A 09/23/2021   Procedure: HOT HEMOSTASIS (ARGON PLASMA COAGULATION/BICAP);  Surgeon: Lavena Bullion, DO;  Location: [facility] ENDOSCOPY;  Service: Gastroenterology;  Laterality: N/A;   INSERTION OF DIALYSIS CATHETER     Rt chest   LAPAROSCOPIC RIGHT COLECTOMY N/A 08/05/2015   Procedure: LAPAROSCOPIC RIGHT COLECTOMY- ASCENDING;  Surgeon: Stark Klein, MD;  Location: [place];  Service: General;  Laterality: N/A;   MASS EXCISION Left 05/28/2019   Procedure: EXCISION SOFT TISSUE MASS LEFT SHOULDER;  Surgeon: Armandina Gemma, MD;  Location: [facility];  Service: General;  Laterality: Left;   NEPHRECTOMY Bilateral    PARATHYROIDECTOMY N/A 06/12/2016   Procedure: TOTAL PARATHYROIDECTOMY WITH AUTOTRANSPLANTATION TO LEFT FOREARM;  Surgeon: Armandina Gemma, MD;  Location: [place];  Service: General;  Laterality: N/A;   PARATHYROIDECTOMY N/A 10/23/2016   Procedure: PARATHYROIDECTOMY;  Surgeon: Armandina Gemma, MD;  Location: [place];  Service: General;  Laterality: N/A;   REVERSE SHOULDER ARTHROPLASTY Right 08/24/2020   Procedure: REVERSE SHOULDER ARTHROPLASTY;  Surgeon: Hiram Gash, MD;  Location: [place];  Service: Orthopedics;  Laterality: Right;   REVISON OF ARTERIOVENOUS FISTULA Right 07/16/2017   Procedure: REVISION OF ARTERIOVENOUS  FISTULA  Right ARM;  Surgeon: Waynetta Sandy, MD;  Location: [place];  Service: Vascular;  Laterality: Right;   TOTAL HIP ARTHROPLASTY Left 11/14/2020   Procedure: LEFT TOTAL HIP ARTHROPLASTY ANTERIOR APPROACH;  Surgeon: Leandrew Koyanagi, MD;  Location: [place];  Service: Orthopedics;  Laterality: Left;   UPPER GASTROINTESTINAL ENDOSCOPY  2021   @ [facility]    ALLERGIES: Allergies  Allergen Reactions   Infed [Iron Dextran] Other (See Comments)    Decreased BP    Oxycodone Nausea Only   Vicodin [Hydrocodone-Acetaminophen] Other (See Comments)    Decreased BP   Diclofenac Other (See Comments)    Unknown    FAMILY HISTORY: Family History  Problem Relation Age of Onset   Diabetes Father    Stroke Father    Hypertension Father    Uterine cancer Mother    Lupus Sister    Stroke Sister    Hypertension Sister    Anuerysm Brother        brain   Colon cancer  Neg Hx    Esophageal cancer Neg Hx    Stomach cancer Neg Hx    Pancreatic cancer Neg Hx    Liver disease Neg Hx    Colon polyps Neg Hx    Rectal cancer Neg Hx     SOCIAL HISTORY: Social History   Tobacco Use   Smoking status: Former    Types: Cigarettes    Quit date: 06/19/1990    Years since quitting: 31.5   Smokeless tobacco: Never   Tobacco comments:    rarely cigar  Vaping Use   Vaping Use: Never used  Substance Use Topics   Alcohol use: Yes    Alcohol/week: 1.0 standard drink of alcohol    Types: 1 Cans of beer per week    Comment: occasional beer   Drug use: Not Currently    Types: Marijuana    Comment: Last use several yrs ago   Social History   Social History Narrative   Lives alone at home   12 years of education    2 children    Not married      OBJECTIVE: PHYSICAL EXAM: There were no vitals taken for this visit.  General:*** General appearance: Awake and alert. No distress. Cooperative with exam.  Skin: No obvious rash or jaundice. HEENT: Atraumatic. Anicteric. Lungs: Non-labored  breathing on room air  Heart: Regular Abdomen: Soft, non tender. Extremities: No edema. No obvious deformity.  Musculoskeletal: No obvious joint swelling. Psych: Affect appropriate.  Neurological: Mental Status: Alert. Speech fluent. No pseudobulbar affect Cranial Nerves: CNII: No RAPD. Visual fields grossly intact. CNIII, IV, VI: PERRL. No nystagmus. EOMI. CN V: Facial sensation intact bilaterally to fine touch. [place] clench strong. Jaw jerk***. CN VII: Facial muscles symmetric and strong. No ptosis at rest or after sustained upgaze***. CN VIII: Hearing grossly intact bilaterally. CN IX: No hypophonia. CN X: Palate elevates symmetrically. CN XI: Full strength shoulder shrug bilaterally. CN XII: Tongue protrusion full and midline. No atrophy or fasciculations. No significant dysarthria*** Motor: Tone is ***. *** fasciculations in *** extremities. *** atrophy. No grip or percussive myotonia.***  Individual muscle group testing (MRC grade out of 5):  Movement     Neck flexion ***    Neck extension ***     Right Left   Shoulder abduction *** ***   Shoulder adduction *** ***   Shoulder ext rotation *** ***   Shoulder int rotation *** ***   Elbow flexion *** ***   Elbow extension *** ***   Wrist extension *** ***   Wrist flexion *** ***   Finger abduction - FDI *** ***   Finger abduction - ADM *** ***   Finger extension *** ***   Finger distal flexion - 2/3 *** ***   Finger distal flexion - 4/5 *** ***   Thumb flexion - FPL *** ***   Thumb abduction - APB *** ***    Hip flexion *** ***   Hip extension *** ***   Hip adduction *** ***   Hip abduction *** ***   Knee extension *** ***   Knee flexion *** ***   Dorsiflexion *** ***   Plantarflexion *** ***   Inversion *** ***   Eversion *** ***   Great toe extension *** ***   Great toe flexion *** ***     Reflexes:  Right Left   Bicep *** ***   Tricep *** ***   BrRad *** ***   Knee *** ***   Ankle *** ***  Pathological Reflexes: Babinski: *** response bilaterally*** Hoffman: *** Troemner: *** Pectoral: *** Palmomental: *** Facial: *** Midline tap: *** Sensation: Pinprick: *** Vibration: *** Temperature: *** Proprioception: *** Coordination: Intact finger-to- nose-finger bilaterally. Romberg negative.*** Gait: Able to rise from chair with arms crossed unassisted. Normal, narrow-based gait. Able to tandem walk. Able to walk on toes and heels.***  Lab and Test Review: Internal labs: CBC (09/29/21): significant for anemia, Hb 5.7 B12 (09/23/21): 1482 Folate wnl HbA1c (11/15/20): 4.8 ***  CT head wo contrast (09/11/21): FINDINGS: Brain: No evidence of acute infarction, hemorrhage, hydrocephalus, extra-axial collection or mass lesion/mass effect. Extensive dural calcification in thickening is again identified and appears similar to the previous exam.   Vascular: No hyperdense vessel or unexpected calcification.   Skull: Normal. Negative for fracture or focal lesion.   Sinuses/Orbits: No acute finding.   Other: None   IMPRESSION: 1. No acute intracranial abnormalities. No significant change from prior exam.  MRI/MRV brain wo contrast (03/02/17): IMPRESSION:  This MRI of the brain without contrast shows the following:  1.    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 This appears stable when compared to the MRI from 12/02/2015. 2.   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 3.    There are no acute findings. ***  ASSESSMENT: Makaveli Hoard Sr. is a 61 y.o. male who presents for evaluation of ***. *** has a relevant medical history of ***. *** neurological examination is pertinent for ***. Available diagnostic data is significant for ***. This constellation of symptoms and objective data would most likely localize  to ***. ***  PLAN: -Blood work: *** ***  -Return to clinic ***  The impression above as well as the plan as outlined below were extensively discussed with the patient (in the company of ***) who voiced understanding. All questions were answered to their satisfaction.  The patient was counseled on pertinent fall precautions per the printed material provided today, and as noted under the "Patient Instructions" section below.***  When available, results of the above investigations and possible further recommendations will be communicated to the patient via telephone/MyChart. Patient to call office if not contacted after expected testing turnaround time.   Total time spent reviewing records, interview, history/exam, documentation, and coordination of care on day of encounter:  *** min   Thank you for allowing me to participate in patient's care.  If I can answer any additional questions, I would be pleased to do so.  Kai Levins, MD   CC: [name], MD [place] [id number]  CC: Referring provider: [name], [place],  [place] [address]

## 2021-12-27 DIAGNOSIS — N186 End stage renal disease: Secondary | ICD-10-CM | POA: Diagnosis not present

## 2021-12-27 DIAGNOSIS — D631 Anemia in chronic kidney disease: Secondary | ICD-10-CM | POA: Diagnosis not present

## 2021-12-27 DIAGNOSIS — E875 Hyperkalemia: Secondary | ICD-10-CM | POA: Diagnosis not present

## 2021-12-27 DIAGNOSIS — Z992 Dependence on renal dialysis: Secondary | ICD-10-CM | POA: Diagnosis not present

## 2021-12-27 DIAGNOSIS — R519 Headache, unspecified: Secondary | ICD-10-CM | POA: Diagnosis not present

## 2021-12-27 DIAGNOSIS — Z7682 Awaiting organ transplant status: Secondary | ICD-10-CM | POA: Diagnosis not present

## 2021-12-27 DIAGNOSIS — D689 Coagulation defect, unspecified: Secondary | ICD-10-CM | POA: Diagnosis not present

## 2021-12-27 DIAGNOSIS — N2581 Secondary hyperparathyroidism of renal origin: Secondary | ICD-10-CM | POA: Diagnosis not present

## 2021-12-28 ENCOUNTER — Ambulatory Visit: Payer: Medicare Other | Admitting: Neurology

## 2021-12-29 DIAGNOSIS — N2581 Secondary hyperparathyroidism of renal origin: Secondary | ICD-10-CM | POA: Diagnosis not present

## 2021-12-29 DIAGNOSIS — Z7682 Awaiting organ transplant status: Secondary | ICD-10-CM | POA: Diagnosis not present

## 2021-12-29 DIAGNOSIS — Z992 Dependence on renal dialysis: Secondary | ICD-10-CM | POA: Diagnosis not present

## 2021-12-29 DIAGNOSIS — D689 Coagulation defect, unspecified: Secondary | ICD-10-CM | POA: Diagnosis not present

## 2021-12-29 DIAGNOSIS — N186 End stage renal disease: Secondary | ICD-10-CM | POA: Diagnosis not present

## 2021-12-29 DIAGNOSIS — E875 Hyperkalemia: Secondary | ICD-10-CM | POA: Diagnosis not present

## 2021-12-29 DIAGNOSIS — R519 Headache, unspecified: Secondary | ICD-10-CM | POA: Diagnosis not present

## 2021-12-29 DIAGNOSIS — D631 Anemia in chronic kidney disease: Secondary | ICD-10-CM | POA: Diagnosis not present

## 2021-12-31 DIAGNOSIS — N2581 Secondary hyperparathyroidism of renal origin: Secondary | ICD-10-CM | POA: Diagnosis not present

## 2021-12-31 DIAGNOSIS — E875 Hyperkalemia: Secondary | ICD-10-CM | POA: Diagnosis not present

## 2021-12-31 DIAGNOSIS — Z7682 Awaiting organ transplant status: Secondary | ICD-10-CM | POA: Diagnosis not present

## 2021-12-31 DIAGNOSIS — R519 Headache, unspecified: Secondary | ICD-10-CM | POA: Diagnosis not present

## 2021-12-31 DIAGNOSIS — N186 End stage renal disease: Secondary | ICD-10-CM | POA: Diagnosis not present

## 2021-12-31 DIAGNOSIS — H6123 Impacted cerumen, bilateral: Secondary | ICD-10-CM | POA: Diagnosis not present

## 2021-12-31 DIAGNOSIS — D631 Anemia in chronic kidney disease: Secondary | ICD-10-CM | POA: Diagnosis not present

## 2021-12-31 DIAGNOSIS — D689 Coagulation defect, unspecified: Secondary | ICD-10-CM | POA: Diagnosis not present

## 2021-12-31 DIAGNOSIS — Z992 Dependence on renal dialysis: Secondary | ICD-10-CM | POA: Diagnosis not present

## 2022-01-02 DIAGNOSIS — Z992 Dependence on renal dialysis: Secondary | ICD-10-CM | POA: Diagnosis not present

## 2022-01-02 DIAGNOSIS — R519 Headache, unspecified: Secondary | ICD-10-CM | POA: Diagnosis not present

## 2022-01-02 DIAGNOSIS — D689 Coagulation defect, unspecified: Secondary | ICD-10-CM | POA: Diagnosis not present

## 2022-01-02 DIAGNOSIS — Z7682 Awaiting organ transplant status: Secondary | ICD-10-CM | POA: Diagnosis not present

## 2022-01-02 DIAGNOSIS — D631 Anemia in chronic kidney disease: Secondary | ICD-10-CM | POA: Diagnosis not present

## 2022-01-02 DIAGNOSIS — E875 Hyperkalemia: Secondary | ICD-10-CM | POA: Diagnosis not present

## 2022-01-02 DIAGNOSIS — N186 End stage renal disease: Secondary | ICD-10-CM | POA: Diagnosis not present

## 2022-01-02 DIAGNOSIS — N2581 Secondary hyperparathyroidism of renal origin: Secondary | ICD-10-CM | POA: Diagnosis not present

## 2022-01-05 DIAGNOSIS — N186 End stage renal disease: Secondary | ICD-10-CM | POA: Diagnosis not present

## 2022-01-05 DIAGNOSIS — Z992 Dependence on renal dialysis: Secondary | ICD-10-CM | POA: Diagnosis not present

## 2022-01-05 DIAGNOSIS — E875 Hyperkalemia: Secondary | ICD-10-CM | POA: Diagnosis not present

## 2022-01-05 DIAGNOSIS — D631 Anemia in chronic kidney disease: Secondary | ICD-10-CM | POA: Diagnosis not present

## 2022-01-05 DIAGNOSIS — R519 Headache, unspecified: Secondary | ICD-10-CM | POA: Diagnosis not present

## 2022-01-05 DIAGNOSIS — N2581 Secondary hyperparathyroidism of renal origin: Secondary | ICD-10-CM | POA: Diagnosis not present

## 2022-01-05 DIAGNOSIS — D689 Coagulation defect, unspecified: Secondary | ICD-10-CM | POA: Diagnosis not present

## 2022-01-05 DIAGNOSIS — Z7682 Awaiting organ transplant status: Secondary | ICD-10-CM | POA: Diagnosis not present

## 2022-01-08 DIAGNOSIS — Z992 Dependence on renal dialysis: Secondary | ICD-10-CM | POA: Diagnosis not present

## 2022-01-08 DIAGNOSIS — R519 Headache, unspecified: Secondary | ICD-10-CM | POA: Diagnosis not present

## 2022-01-08 DIAGNOSIS — Z7682 Awaiting organ transplant status: Secondary | ICD-10-CM | POA: Diagnosis not present

## 2022-01-08 DIAGNOSIS — N2581 Secondary hyperparathyroidism of renal origin: Secondary | ICD-10-CM | POA: Diagnosis not present

## 2022-01-08 DIAGNOSIS — D689 Coagulation defect, unspecified: Secondary | ICD-10-CM | POA: Diagnosis not present

## 2022-01-08 DIAGNOSIS — D631 Anemia in chronic kidney disease: Secondary | ICD-10-CM | POA: Diagnosis not present

## 2022-01-08 DIAGNOSIS — N186 End stage renal disease: Secondary | ICD-10-CM | POA: Diagnosis not present

## 2022-01-08 DIAGNOSIS — E875 Hyperkalemia: Secondary | ICD-10-CM | POA: Diagnosis not present

## 2022-01-10 ENCOUNTER — Telehealth: Payer: Self-pay | Admitting: Orthopaedic Surgery

## 2022-01-10 DIAGNOSIS — R519 Headache, unspecified: Secondary | ICD-10-CM | POA: Diagnosis not present

## 2022-01-10 DIAGNOSIS — E875 Hyperkalemia: Secondary | ICD-10-CM | POA: Diagnosis not present

## 2022-01-10 DIAGNOSIS — N186 End stage renal disease: Secondary | ICD-10-CM | POA: Diagnosis not present

## 2022-01-10 DIAGNOSIS — Z7682 Awaiting organ transplant status: Secondary | ICD-10-CM | POA: Diagnosis not present

## 2022-01-10 DIAGNOSIS — Z992 Dependence on renal dialysis: Secondary | ICD-10-CM | POA: Diagnosis not present

## 2022-01-10 DIAGNOSIS — D631 Anemia in chronic kidney disease: Secondary | ICD-10-CM | POA: Diagnosis not present

## 2022-01-10 DIAGNOSIS — D689 Coagulation defect, unspecified: Secondary | ICD-10-CM | POA: Diagnosis not present

## 2022-01-10 DIAGNOSIS — N2581 Secondary hyperparathyroidism of renal origin: Secondary | ICD-10-CM | POA: Diagnosis not present

## 2022-01-10 NOTE — Telephone Encounter (Signed)
Pt called requesting a call back from [place]. Pt states he has some medical questions about surgery. Pt states he can explain further when Lauren G calls. Please call pt at [phone].

## 2022-01-11 ENCOUNTER — Telehealth: Payer: Self-pay | Admitting: Orthopaedic Surgery

## 2022-01-11 DIAGNOSIS — I12 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 stage renal disease: Secondary | ICD-10-CM | POA: Diagnosis not present

## 2022-01-11 DIAGNOSIS — Z992 Dependence on renal dialysis: Secondary | ICD-10-CM | POA: Diagnosis not present

## 2022-01-11 DIAGNOSIS — N186 End stage renal disease: Secondary | ICD-10-CM | POA: Diagnosis not present

## 2022-01-11 NOTE — Telephone Encounter (Signed)
Patient states he missed a call from [facility] 2 days ago. Please advise..[phone]

## 2022-01-11 NOTE — Progress Notes (Signed)
Initial neurology clinic note  SERVICE DATE: 01/16/22  Reason for Evaluation: Consultation requested by [name], MD for an opinion regarding numbness and tingling of hands. My final recommendations will be communicated back to the requesting physician by way of shared medical record or letter to requesting physician via [place] mail.  HPI: This is [name], a 61 y.o. right-handed male with a medical history of HLD, iron deficiency anemia, ESRD on dialysis, duodenal ulcer disease, OA s/p left total hip replacement who presents to neurology clinic with the chief complaint of numbness and tingling of hands (left more than right). The patient is alone.  Patient has had numbness and tingling in hands for the last couple of months. It is more in the left hand than the right. It occurs mostly at night. It is a pins and needles and burning sensation. It feels like it starts in the thumb area but involves the whole hand. He has occasional neck pain, but this is not prominent. He denies any weakness in his hands.  He has occasional numbness and tingling in his feet as well.  He takes gabapentin 100 mg at night. He takes it most nights, but not every night. He takes B12 1000 mcg daily.  EtOH use: Very rare  Restrictive diet? No Family history of neuropathy/myopathy/NM disease? No  Patient has never had an EMG.   MEDICATIONS:  Outpatient Encounter Medications as of 01/16/2022  Medication Sig Note   acetaminophen (TYLENOL) 650 MG CR tablet Take 1,300 mg by mouth every 8 (eight) hours as needed for pain.    amoxicillin (AMOXIL) 500 MG capsule Take 4 pills one hour prior to dental work    atorvastatin (LIPITOR) 10 MG tablet Take 10 mg by mouth 2 (two) times a week.    augmented betamethasone dipropionate (DIPROLENE-AF) 0.05 % ointment Apply 1 Application topically 2 (two) times daily as needed for rash.    cinacalcet (SENSIPAR) 60 MG tablet Take 120 mg by mouth daily with supper.     cromolyn (OPTICROM) 4 % ophthalmic solution Place 1 drop into both eyes daily as needed (redness).    ferrous sulfate 325 (65 FE) MG tablet Take 1 tablet once daily    folic acid (FOLVITE) 1 MG tablet Take 1 tablet (1 mg total) by mouth daily.    gabapentin (NEURONTIN) 100 MG capsule Take 100 mg by mouth at bedtime.    LOKELMA 10 g PACK packet Take 1 packet by mouth daily.    Methoxy PEG-Epoetin Beta (MIRCERA IJ) Mircera 09/11/2021: Given at Dialysis   multivitamin (RENA-VIT) TABS tablet Take 1 tablet by mouth daily.    pantoprazole (PROTONIX) 40 MG tablet Take 1 tablet (40 mg total) by mouth 2 (two) times daily before a meal.    sevelamer carbonate (RENVELA) 800 MG tablet Take 4,000 mg by mouth 3 (three) times daily with meals. 5 tabs with meals    sucralfate (CARAFATE) 1 GM/10ML suspension Take 10 mLs (1 g total) by mouth 2 (two) times daily.    vitamin B-12 (CYANOCOBALAMIN) 1000 MCG tablet Take 1,000 mcg by mouth daily.    vitamin E 180 MG (400 UNITS) capsule Take 400 Units by mouth 3 (three) times a week.    No facility-administered encounter medications on file as of 01/16/2022.    PAST MEDICAL HISTORY: Past Medical History:  Diagnosis Date   Acute gastric ulcer with hemorrhage    Acute GI bleeding 07/31/2019   Acute pericarditis    Anemia  hx of   Anxiety    situational    Arthritis    on meds   Borderline diabetes    Cataract    bilateral sx   Chronic headaches    Depression    situational    Diverticulitis    ESRD (end stage renal disease) ([place])     On Renal Transplant List," [facility]; MWF" (10/23/2016)   GERD (gastroesophageal reflux disease)    with certain foods   GI bleed    Hypertension    diet controlled   Parathyroid abnormality (HCC)    ectopic parathyroid gland   Presence of arteriovenous fistula for hemodialysis, primary ([place])    RUE PER PT RLE   Refusal of blood product    NO WHOLE BLOOD PROUCTS   Renal cell carcinoma ([place])    s/p hand assisted  laparoscopic bilateral nephrectomies 11/29/17, + RCC left   Secondary hyperparathyroidism ([place])    Seizures ([place])    one episode in past, due to" elevated Potassium" 08/02/20- "at least 4 years ago"   Sleep apnea    doesn't use CPAP anymore since weight loss   Stroke ([place])    no residual    PAST SURGICAL HISTORY: Past Surgical History:  Procedure Laterality Date   AV FISTULA PLACEMENT Right    right arm   BIOPSY  08/01/2019   Procedure: BIOPSY;  Surgeon: Juanita Craver, MD;  Location: [facility] ENDOSCOPY;  Service: Endoscopy;;   BIOPSY  11/18/2020   Procedure: BIOPSY;  Surgeon: Daryel November, MD;  Location: [place];  Service: Gastroenterology;;   BIOPSY  09/13/2021   Procedure: BIOPSY;  Surgeon: Irving Copas., MD;  Location: [place];  Service: Gastroenterology;;   CATARACT EXTRACTION W/ INTRAOCULAR LENS  IMPLANT, BILATERAL     COLON SURGERY     COLONOSCOPY N/A 08/04/2015   Procedure: COLONOSCOPY;  Surgeon: Jerene Bears, MD;  Location: [facility] ENDOSCOPY;  Service: Endoscopy;  Laterality: N/A;   COLONOSCOPY  2017   JMP@ [facility]-good prep-mass -recall 1 yr   COLONOSCOPY N/A 09/13/2021   Procedure: COLONOSCOPY;  Surgeon: Mansouraty, Telford Nab., MD;  Location: [address];  Service: Gastroenterology;  Laterality: N/A;   COLONOSCOPY WITH PROPOFOL N/A 11/25/2020   Procedure: COLONOSCOPY WITH PROPOFOL;  Surgeon: Sharyn Creamer, MD;  Location: [place];  Service: Gastroenterology;  Laterality: N/A;   COLONOSCOPY WITH PROPOFOL N/A 09/23/2021   Procedure: COLONOSCOPY WITH PROPOFOL;  Surgeon: Lavena Bullion, DO;  Location: [place];  Service: Gastroenterology;  Laterality: N/A;   ENTEROSCOPY N/A 09/23/2021   Procedure: ENTEROSCOPY;  Surgeon: Lavena Bullion, DO;  Location: [place];  Service: Gastroenterology;  Laterality: N/A;  Will place order for video capsule study as we may opt to place it during procedure   ESOPHAGOGASTRODUODENOSCOPY N/A 08/01/2019   Procedure:  ESOPHAGOGASTRODUODENOSCOPY (EGD);  Surgeon: Juanita Craver, MD;  Location: [facility] ENDOSCOPY;  Service: Endoscopy;  Laterality: N/A;   ESOPHAGOGASTRODUODENOSCOPY N/A 11/18/2020   Procedure: ESOPHAGOGASTRODUODENOSCOPY (EGD);  Surgeon: Daryel November, MD;  Location: [place];  Service: Gastroenterology;  Laterality: N/A;   ESOPHAGOGASTRODUODENOSCOPY (EGD) WITH PROPOFOL N/A 08/04/2019   Procedure: ESOPHAGOGASTRODUODENOSCOPY (EGD) WITH PROPOFOL;  Surgeon: Yetta Flock, MD;  Location: [place];  Service: Gastroenterology;  Laterality: N/A;   ESOPHAGOGASTRODUODENOSCOPY (EGD) WITH PROPOFOL N/A 09/13/2021   Procedure: ESOPHAGOGASTRODUODENOSCOPY (EGD) WITH PROPOFOL;  Surgeon: Rush Landmark Telford Nab., MD;  Location: [place];  Service: Gastroenterology;  Laterality: N/A;   GIVENS CAPSULE STUDY N/A 09/23/2021   Procedure: GIVENS CAPSULE STUDY;  Surgeon: Lavena Bullion, DO;  Location: [facility] ENDOSCOPY;  Service: Gastroenterology;  Laterality: N/A;   graft left arm Left    for dialysis x 2. Removed   HOT HEMOSTASIS  11/18/2020   Procedure: HOT HEMOSTASIS (ARGON PLASMA COAGULATION/BICAP);  Surgeon: Daryel November, MD;  Location: [place];  Service: Gastroenterology;;   HOT HEMOSTASIS N/A 09/23/2021   Procedure: HOT HEMOSTASIS (ARGON PLASMA COAGULATION/BICAP);  Surgeon: Lavena Bullion, DO;  Location: [facility] ENDOSCOPY;  Service: Gastroenterology;  Laterality: N/A;   INSERTION OF DIALYSIS CATHETER     Rt chest   LAPAROSCOPIC RIGHT COLECTOMY N/A 08/05/2015   Procedure: LAPAROSCOPIC RIGHT COLECTOMY- ASCENDING;  Surgeon: Stark Klein, MD;  Location: [place];  Service: General;  Laterality: N/A;   MASS EXCISION Left 05/28/2019   Procedure: EXCISION SOFT TISSUE MASS LEFT SHOULDER;  Surgeon: Armandina Gemma, MD;  Location: [facility];  Service: General;  Laterality: Left;   NEPHRECTOMY Bilateral    PARATHYROIDECTOMY N/A 06/12/2016   Procedure: TOTAL PARATHYROIDECTOMY WITH AUTOTRANSPLANTATION TO LEFT  FOREARM;  Surgeon: Armandina Gemma, MD;  Location: [place];  Service: General;  Laterality: N/A;   PARATHYROIDECTOMY N/A 10/23/2016   Procedure: PARATHYROIDECTOMY;  Surgeon: Armandina Gemma, MD;  Location: [place];  Service: General;  Laterality: N/A;   REVERSE SHOULDER ARTHROPLASTY Right 08/24/2020   Procedure: REVERSE SHOULDER ARTHROPLASTY;  Surgeon: Hiram Gash, MD;  Location: [place];  Service: Orthopedics;  Laterality: Right;   REVISON OF ARTERIOVENOUS FISTULA Right 07/16/2017   Procedure: REVISION OF ARTERIOVENOUS FISTULA  Right ARM;  Surgeon: Waynetta Sandy, MD;  Location: [place];  Service: Vascular;  Laterality: Right;   TOTAL HIP ARTHROPLASTY Left 11/14/2020   Procedure: LEFT TOTAL HIP ARTHROPLASTY ANTERIOR APPROACH;  Surgeon: Leandrew Koyanagi, MD;  Location: [place];  Service: Orthopedics;  Laterality: Left;   UPPER GASTROINTESTINAL ENDOSCOPY  2021   @ [facility]    ALLERGIES: Allergies  Allergen Reactions   Infed [Iron Dextran] Other (See Comments)    Decreased BP    Oxycodone Nausea Only   Vicodin [Hydrocodone-Acetaminophen] Other (See Comments)    Decreased BP   Diclofenac Other (See Comments)    Unknown    FAMILY HISTORY: Family History  Problem Relation Age of Onset   Diabetes Father    Stroke Father    Hypertension Father    Uterine cancer Mother    Lupus Sister    Stroke Sister    Hypertension Sister    Anuerysm Brother        brain   Colon cancer Neg Hx    Esophageal cancer Neg Hx    Stomach cancer Neg Hx    Pancreatic cancer Neg Hx    Liver disease Neg Hx    Colon polyps Neg Hx    Rectal cancer Neg Hx     SOCIAL HISTORY: Social History   Tobacco Use   Smoking status: Former    Types: Cigarettes    Quit date: 06/19/1990    Years since quitting: 31.6   Smokeless tobacco: Never   Tobacco comments:    rarely cigar  Vaping Use   Vaping Use: Never used  Substance Use Topics   Alcohol use: Yes    Alcohol/week: 1.0 standard drink of alcohol    Types: 1 Cans of  beer per week    Comment: occasional beer   Drug use: Not Currently    Types: Marijuana    Comment: Last use several yrs ago   Social History  Social History Narrative   Lives alone at home with brother   57 years of education    2 children    Not married    Are you right handed or left handed? Rtight   Are you currently employed ? retired   What is your current occupation?   Do you live at home alone?   Who lives with you?    What type of home do you live in: 1 story or 2 story? One   Caffeine 1 cup a day         OBJECTIVE: PHYSICAL EXAM: BP 113/60   Pulse (!) 109   Ht 6' 1.2" (1.859 m)   Wt 204 lb 3.2 oz (92.6 kg)   SpO2 98%   BMI 26.79 kg/m   General: General appearance: Awake and alert. No distress. Cooperative with exam.  Skin: No obvious rash or jaundice. HEENT: Atraumatic. Anicteric. Lungs: Non-labored breathing on room air  Extremities: No edema. Hammertoes bilaterally.  Psych: Affect appropriate.  Neurological: Mental Status: Alert. Speech fluent. No pseudobulbar affect Cranial Nerves: CNII: No RAPD. Visual fields grossly intact. CNIII, IV, VI: PERRL. No nystagmus. EOMI. CN V: Facial sensation intact bilaterally to fine touch. CN VII: Facial muscles symmetric and strong. No ptosis at rest. CN VIII: Hearing grossly intact bilaterally. CN IX: No hypophonia. CN X: Palate elevates symmetrically. CN XI: Full strength shoulder shrug bilaterally. CN XII: Tongue protrusion full and midline. No atrophy or fasciculations. No significant dysarthria Motor: Tone is normal. Atrophy of FDI, ADM and APB of RUE and APB of LUE  Individual muscle group testing (MRC grade out of 5):  Movement     Neck flexion 5    Neck extension 5     Right Left   Shoulder abduction 5 5   Elbow flexion 5 5   Elbow extension 5 5   Wrist extension 5 5   Wrist flexion 5 5   Finger abduction - FDI 4- 4+   Finger abduction - ADM 4- 4+   Finger extension 5 5   Finger distal  flexion - 2/'3 5 5   '$ Finger distal flexion - 4/'5 5 5   '$ Thumb flexion - FPL 5 5   Thumb abduction - APB 4 4    Hip flexion 5 5   Knee extension 5 5   Knee flexion 5 5   Dorsiflexion 5 5   Plantarflexion 5 5     Reflexes:  Right Left   Bicep Trace Trace   Tricep 1+ 1+   BrRad 2+ 2+   Knee 0 0   Ankle 0 0    Pathological Reflexes: Babinski: flexor response bilaterally Hoffman: absent bilaterally Troemner: absent bilaterally Sensation: Pinprick: Diminished in bilateral feet to ankles. Intact in bilateral upper extremities Vibration: 8 seconds in right great toe, > 20 seconds in left great toe Proprioception: Intact in bilateral great toes Coordination: Intact finger-to- nose-finger bilaterally. Romberg with mild sway. Gait: Narrow based, antalgic gait. Imbalance when turning  Lab and Test Review: Internal labs: CBC (09/29/21): significant for anemia, Hb 5.7 (transfusion on 10/02/21) B12 (09/23/21): 1482 Folate wnl HbA1c (11/15/20): 4.8   CT head wo contrast (09/11/21): FINDINGS: Brain: No evidence of acute infarction, hemorrhage, hydrocephalus, extra-axial collection or mass lesion/mass effect. Extensive dural calcification in thickening is again identified and appears similar to the previous exam.   Vascular: No hyperdense vessel or unexpected calcification.   Skull: Normal. Negative for fracture or focal lesion.   Sinuses/Orbits:  No acute finding.   Other: None   IMPRESSION: 1. No acute intracranial abnormalities. No significant change from prior exam.   MRI/MRV brain wo contrast (03/02/17): IMPRESSION:  This MRI of the brain without contrast shows the following:  1.    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 This appears stable when compared to the MRI from 12/02/2015. 2.   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 3.    There are no acute findings.  ASSESSMENT: Jefferson Fullam Sr. is a 61 y.o. male who presents for evaluation of numbness and tingling in hands. He has a relevant medical history of HLD, iron deficiency anemia, ESRD on dialysis, duodenal ulcer disease, OA s/p left total hip replacement. His neurological examination is pertinent for atrophy of right > left intrinsic hand muscles and sensory changes in right foot. Available diagnostic data is significant for normal B12 and A1c. The etiology of patient's symptoms is currently unclear. His RUE may be due to radiculopathy (C8-T1) vs carpal tunnel and ulnar mononeuropathy. The LUE is similar but less severe. The sensory asymmetry in the lower extremities is also unusual and without clear explanation. I will work up as below.  PLAN: -Blood work: CBC, IFE -EMG: RUE and RLE (radic? Ulnar? Median?). Patient reports LUE is more numb/tingling, can check this if able. -Increase gabapentin to 200 mg qhs  -Return to clinic in 3 months  The impression above as well as the plan as outlined below were extensively discussed with the patient who voiced understanding. All questions were answered to their satisfaction.  The patient was counseled on pertinent fall precautions per the printed material provided today, and as noted under the "Patient Instructions" section below.  When available, results of the above investigations and possible further recommendations will be communicated to the patient via telephone/MyChart. Patient to call office if not contacted after expected testing turnaround time.   Total time spent reviewing records, interview, history/exam, documentation, and coordination of care on day of encounter:  40 min   Thank you for allowing me to participate in patient's care.  If I can answer any additional questions, I would be pleased to do so.  Kai Levins, MD   CC: [name], MD [place] [id number]  CC: Referring provider: [name], [place],  [place] [address]

## 2022-01-12 DIAGNOSIS — Z992 Dependence on renal dialysis: Secondary | ICD-10-CM | POA: Diagnosis not present

## 2022-01-12 DIAGNOSIS — R197 Diarrhea, unspecified: Secondary | ICD-10-CM | POA: Diagnosis not present

## 2022-01-12 DIAGNOSIS — N2581 Secondary hyperparathyroidism of renal origin: Secondary | ICD-10-CM | POA: Diagnosis not present

## 2022-01-12 DIAGNOSIS — D689 Coagulation defect, unspecified: Secondary | ICD-10-CM | POA: Diagnosis not present

## 2022-01-12 DIAGNOSIS — N186 End stage renal disease: Secondary | ICD-10-CM | POA: Diagnosis not present

## 2022-01-12 DIAGNOSIS — E875 Hyperkalemia: Secondary | ICD-10-CM | POA: Diagnosis not present

## 2022-01-12 DIAGNOSIS — R519 Headache, unspecified: Secondary | ICD-10-CM | POA: Diagnosis not present

## 2022-01-12 DIAGNOSIS — Z7682 Awaiting organ transplant status: Secondary | ICD-10-CM | POA: Diagnosis not present

## 2022-01-12 DIAGNOSIS — D631 Anemia in chronic kidney disease: Secondary | ICD-10-CM | POA: Diagnosis not present

## 2022-01-15 DIAGNOSIS — D689 Coagulation defect, unspecified: Secondary | ICD-10-CM | POA: Diagnosis not present

## 2022-01-15 DIAGNOSIS — D631 Anemia in chronic kidney disease: Secondary | ICD-10-CM | POA: Diagnosis not present

## 2022-01-15 DIAGNOSIS — N2581 Secondary hyperparathyroidism of renal origin: Secondary | ICD-10-CM | POA: Diagnosis not present

## 2022-01-15 DIAGNOSIS — Z7682 Awaiting organ transplant status: Secondary | ICD-10-CM | POA: Diagnosis not present

## 2022-01-15 DIAGNOSIS — R197 Diarrhea, unspecified: Secondary | ICD-10-CM | POA: Diagnosis not present

## 2022-01-15 DIAGNOSIS — E875 Hyperkalemia: Secondary | ICD-10-CM | POA: Diagnosis not present

## 2022-01-15 DIAGNOSIS — N186 End stage renal disease: Secondary | ICD-10-CM | POA: Diagnosis not present

## 2022-01-15 DIAGNOSIS — Z992 Dependence on renal dialysis: Secondary | ICD-10-CM | POA: Diagnosis not present

## 2022-01-15 DIAGNOSIS — R519 Headache, unspecified: Secondary | ICD-10-CM | POA: Diagnosis not present

## 2022-01-16 ENCOUNTER — Telehealth: Payer: Self-pay | Admitting: Orthopaedic Surgery

## 2022-01-16 ENCOUNTER — Ambulatory Visit (INDEPENDENT_AMBULATORY_CARE_PROVIDER_SITE_OTHER): Payer: Medicare Other | Admitting: Neurology

## 2022-01-16 ENCOUNTER — Encounter: Payer: Self-pay | Admitting: Neurology

## 2022-01-16 ENCOUNTER — Other Ambulatory Visit (INDEPENDENT_AMBULATORY_CARE_PROVIDER_SITE_OTHER): Payer: Medicare Other

## 2022-01-16 VITALS — BP 113/60 | HR 109 | Ht 73.2 in | Wt 204.2 lb

## 2022-01-16 DIAGNOSIS — R29898 Other symptoms and signs involving the musculoskeletal system: Secondary | ICD-10-CM

## 2022-01-16 DIAGNOSIS — M1990 Unspecified osteoarthritis, unspecified site: Secondary | ICD-10-CM | POA: Diagnosis not present

## 2022-01-16 DIAGNOSIS — R202 Paresthesia of skin: Secondary | ICD-10-CM

## 2022-01-16 DIAGNOSIS — R200 Anesthesia of skin: Secondary | ICD-10-CM

## 2022-01-16 LAB — CBC
HCT: 38.1 % — ABNORMAL LOW (ref 39.0–52.0)
Hemoglobin: 12 g/dL — ABNORMAL LOW (ref 13.0–17.0)
MCHC: 31.5 g/dL (ref 30.0–36.0)
MCV: 81.7 fl (ref 78.0–100.0)
Platelets: 257 10*3/uL (ref 150.0–400.0)
RBC: 4.66 Mil/uL (ref 4.22–5.81)
RDW: 21.5 % — ABNORMAL HIGH (ref 11.5–15.5)
WBC: 5.1 10*3/uL (ref 4.0–10.5)

## 2022-01-16 MED ORDER — GABAPENTIN 100 MG PO CAPS: 200.0000 mg | ORAL_CAPSULE | Freq: Every day | ORAL | 5 refills | Status: DC

## 2022-01-16 NOTE — Telephone Encounter (Signed)
Patient states [employer] needs Verification that he can get the Insoles and we wrote him a Rx for them call provider line at [phone]

## 2022-01-16 NOTE — Patient Instructions (Signed)
I would like to investigate your symptoms further to determine the cause of the numbness and tingling and muscle loss, particularly in your right hand.  I would like to get lab work today.  I would like to get a muscle and nerve test called an EMG (see more information below).  For your symptoms, you can increase your gabapentin to 200 mg at night. I sent a new prescription to your pharmacy.  I will be in touch when I have your results.  I would like to see you back in clinic in about 3 months.  The physicians and staff at [facility] are committed to providing excellent care. You may receive a survey requesting feedback about your experience at our office. We strive to receive "very good" responses to the survey questions. If you feel that your experience would prevent you from giving the office a "very good " response, please contact our office to try to remedy the situation. We may be reached at [phone]. Thank you for taking the time out of your busy day to complete the survey.  Kai Levins, MD [facility]  ELECTROMYOGRAM AND NERVE CONDUCTION STUDIES (EMG/NCS) INSTRUCTIONS  How to Prepare The neurologist conducting the EMG will need to know if you have certain medical conditions. Tell the neurologist and other EMG lab personnel if you: Have a pacemaker or any other electrical medical device Take blood-thinning medications Have hemophilia, a blood-clotting disorder that causes prolonged bleeding Bathing Take a shower or bath shortly before your exam in order to remove oils from your skin. Don't apply lotions or creams before the exam.  What to Expect You'll likely be asked to change into a hospital gown for the procedure and lie down on an examination table. The following explanations can help you understand what will happen during the exam.  Electrodes. The neurologist or a technician places surface electrodes at various locations on your skin depending on where you're  experiencing symptoms. Or the neurologist may insert needle electrodes at different sites depending on your symptoms.  Sensations. The electrodes will at times transmit a tiny electrical current that you may feel as a twinge or spasm. The needle electrode may cause discomfort or pain that usually ends shortly after the needle is removed. If you are concerned about discomfort or pain, you may want to talk to the neurologist about taking a short break during the exam.  Instructions. During the needle EMG, the neurologist will assess whether there is any spontaneous electrical activity when the muscle is at rest - activity that isn't present in healthy muscle tissue - and the degree of activity when you slightly contract the muscle.  He or she will give you instructions on resting and contracting a muscle at appropriate times. Depending on what muscles and nerves the neurologist is examining, he or she may ask you to change positions during the exam.  After your EMG You may experience some temporary, minor bruising where the needle electrode was inserted into your muscle. This bruising should fade within several days. If it persists, contact your primary care doctor.   Preventing Falls at [facility] are common, often dreaded events in the lives of older people. Aside from the obvious injuries and even death that may result, fall can cause wide-ranging consequences including loss of independence, mental decline, decreased activity and mobility. Younger people are also at risk of falling, especially those with chronic illnesses and fatigue.  Ways to reduce risk for falling Examine diet and  medications. Warm foods and alcohol dilate blood vessels, which can lead to dizziness when standing. Sleep aids, antidepressants and pain medications can also increase the likelihood of a fall.  Get a vision exam. Poor vision, cataracts and glaucoma increase the chances of falling.  Check foot gear. Shoes should fit  snugly and have a sturdy, nonskid sole and a broad, low heel  Participate in a physician-approved exercise program to build and maintain muscle strength and improve balance and coordination. Programs that use ankle weights or stretch bands are excellent for muscle-strengthening. Water aerobics programs and low-impact Tai Chi programs have also been shown to improve balance and coordination.  Increase vitamin D intake. Vitamin D improves muscle strength and increases the amount of calcium the body is able to absorb and deposit in bones.  How to prevent falls from common hazards Floors - Remove all loose wires, cords, and throw rugs. Minimize clutter. Make sure rugs are anchored and smooth. Keep furniture in its usual place.  Chairs -- Use chairs with straight backs, armrests and firm seats. Add firm cushions to existing pieces to add height.  Bathroom - Install grab bars and non-skid tape in the tub or shower. Use a bathtub transfer bench or a shower chair with a back support Use an elevated toilet seat and/or safety rails to assist standing from a low surface. Do not use towel racks or bathroom tissue holders to help you stand.  Lighting - Make sure halls, stairways, and entrances are well-lit. Install a night light in your bathroom or hallway. Make sure there is a light switch at the top and bottom of the staircase. Turn lights on if you get up in the middle of the night. Make sure lamps or light switches are within reach of the bed if you have to get up during the night.  Kitchen - Install non-skid rubber mats near the sink and stove. Clean spills immediately. Store frequently used utensils, pots, pans between waist and eye level. This helps prevent reaching and bending. Sit when getting things out of lower cupboards.  Living room/ Bedrooms - Place furniture with wide spaces in between, giving enough room to move around. Establish a route through the living room that gives you something to hold  onto as you walk.  Stairs - Make sure treads, rails, and rugs are secure. Install a rail on both sides of the stairs. If stairs are a threat, it might be helpful to arrange most of your activities on the lower level to reduce the number of times you must climb the stairs.  Entrances and doorways - Install metal handles on the walls adjacent to the doorknobs of all doors to make it more secure as you travel through the doorway.  Tips for maintaining balance Keep at least one hand free at all times. Try using a backpack or fanny pack to hold things rather than carrying them in your hands. Never carry objects in both hands when walking as this interferes with keeping your balance.  Attempt to swing both arms from front to back while walking. This might require a conscious effort if Parkinson's disease has diminished your movement. It will, however, help you to maintain balance and posture, and reduce fatigue.  Consciously lift your feet off of the ground when walking. Shuffling and dragging of the feet is a common culprit in losing your balance.  When trying to navigate turns, use a "U" technique of facing forward and making a wide turn, rather than pivoting  sharply.  Try to stand with your feet shoulder-length apart. When your feet are close together for any length of time, you increase your risk of losing your balance and falling.  Do one thing at a time. Don't try to walk and accomplish another task, such as reading or looking around. The decrease in your automatic reflexes complicates motor function, so the less distraction, the better.  Do not wear rubber or gripping soled shoes, they might "catch" on the floor and cause tripping.  Move slowly when changing positions. Use deliberate, concentrated movements and, if needed, use a grab bar or walking aid. Count 15 seconds between each movement. For example, when rising from a seated position, wait 15 seconds after standing to begin walking.  If  balance is a continuous problem, you might want to consider a walking aid such as a cane, walking stick, or walker. Once you've mastered walking with help, you might be ready to try it on your own again.

## 2022-01-17 ENCOUNTER — Telehealth: Payer: Self-pay | Admitting: Orthopaedic Surgery

## 2022-01-17 DIAGNOSIS — R197 Diarrhea, unspecified: Secondary | ICD-10-CM | POA: Diagnosis not present

## 2022-01-17 DIAGNOSIS — N186 End stage renal disease: Secondary | ICD-10-CM | POA: Diagnosis not present

## 2022-01-17 DIAGNOSIS — D689 Coagulation defect, unspecified: Secondary | ICD-10-CM | POA: Diagnosis not present

## 2022-01-17 DIAGNOSIS — Z992 Dependence on renal dialysis: Secondary | ICD-10-CM | POA: Diagnosis not present

## 2022-01-17 DIAGNOSIS — E875 Hyperkalemia: Secondary | ICD-10-CM | POA: Diagnosis not present

## 2022-01-17 DIAGNOSIS — Z7682 Awaiting organ transplant status: Secondary | ICD-10-CM | POA: Diagnosis not present

## 2022-01-17 DIAGNOSIS — D631 Anemia in chronic kidney disease: Secondary | ICD-10-CM | POA: Diagnosis not present

## 2022-01-17 DIAGNOSIS — R519 Headache, unspecified: Secondary | ICD-10-CM | POA: Diagnosis not present

## 2022-01-17 DIAGNOSIS — N2581 Secondary hyperparathyroidism of renal origin: Secondary | ICD-10-CM | POA: Diagnosis not present

## 2022-01-17 NOTE — Telephone Encounter (Signed)
Pt called stated he received a few calls yesterday and was unsure if it was Lauren g about his RX for inserts. Please call pt about this matter at [phone]

## 2022-01-17 NOTE — Telephone Encounter (Signed)
Me neither

## 2022-01-18 ENCOUNTER — Telehealth: Payer: Self-pay | Admitting: Orthopaedic Surgery

## 2022-01-18 LAB — IMMUNOFIXATION ELECTROPHORESIS
IgG (Immunoglobin G), Serum: 1800 mg/dL — ABNORMAL HIGH (ref 600–1540)
IgM, Serum: 220 mg/dL (ref 50–300)
Immunoglobulin A: 555 mg/dL — ABNORMAL HIGH (ref 70–320)

## 2022-01-18 NOTE — Telephone Encounter (Signed)
See other phone message  

## 2022-01-18 NOTE — Telephone Encounter (Signed)
Was speaking with patient and got disconnected.  I explained that Dr.Xu said insurance does not approve inserts unless he is diabetic. He has a CPT code that the store gave him. He was going to give it to me and was disconnected. Tried him back. No answer. LMOM.

## 2022-01-18 NOTE — Telephone Encounter (Signed)
Patient states he needs an approval for his shoe inserts. Please advise [phone]

## 2022-01-19 DIAGNOSIS — Z7682 Awaiting organ transplant status: Secondary | ICD-10-CM | POA: Diagnosis not present

## 2022-01-19 DIAGNOSIS — D631 Anemia in chronic kidney disease: Secondary | ICD-10-CM | POA: Diagnosis not present

## 2022-01-19 DIAGNOSIS — R519 Headache, unspecified: Secondary | ICD-10-CM | POA: Diagnosis not present

## 2022-01-19 DIAGNOSIS — D689 Coagulation defect, unspecified: Secondary | ICD-10-CM | POA: Diagnosis not present

## 2022-01-19 DIAGNOSIS — Z992 Dependence on renal dialysis: Secondary | ICD-10-CM | POA: Diagnosis not present

## 2022-01-19 DIAGNOSIS — N2581 Secondary hyperparathyroidism of renal origin: Secondary | ICD-10-CM | POA: Diagnosis not present

## 2022-01-19 DIAGNOSIS — R197 Diarrhea, unspecified: Secondary | ICD-10-CM | POA: Diagnosis not present

## 2022-01-19 DIAGNOSIS — N186 End stage renal disease: Secondary | ICD-10-CM | POA: Diagnosis not present

## 2022-01-19 DIAGNOSIS — E875 Hyperkalemia: Secondary | ICD-10-CM | POA: Diagnosis not present

## 2022-01-22 DIAGNOSIS — D631 Anemia in chronic kidney disease: Secondary | ICD-10-CM | POA: Diagnosis not present

## 2022-01-22 DIAGNOSIS — R519 Headache, unspecified: Secondary | ICD-10-CM | POA: Diagnosis not present

## 2022-01-22 DIAGNOSIS — N2581 Secondary hyperparathyroidism of renal origin: Secondary | ICD-10-CM | POA: Diagnosis not present

## 2022-01-22 DIAGNOSIS — D689 Coagulation defect, unspecified: Secondary | ICD-10-CM | POA: Diagnosis not present

## 2022-01-22 DIAGNOSIS — E875 Hyperkalemia: Secondary | ICD-10-CM | POA: Diagnosis not present

## 2022-01-22 DIAGNOSIS — R197 Diarrhea, unspecified: Secondary | ICD-10-CM | POA: Diagnosis not present

## 2022-01-22 DIAGNOSIS — Z7682 Awaiting organ transplant status: Secondary | ICD-10-CM | POA: Diagnosis not present

## 2022-01-22 DIAGNOSIS — Z992 Dependence on renal dialysis: Secondary | ICD-10-CM | POA: Diagnosis not present

## 2022-01-22 DIAGNOSIS — N186 End stage renal disease: Secondary | ICD-10-CM | POA: Diagnosis not present

## 2022-01-24 DIAGNOSIS — Z7682 Awaiting organ transplant status: Secondary | ICD-10-CM | POA: Diagnosis not present

## 2022-01-24 DIAGNOSIS — N186 End stage renal disease: Secondary | ICD-10-CM | POA: Diagnosis not present

## 2022-01-24 DIAGNOSIS — R519 Headache, unspecified: Secondary | ICD-10-CM | POA: Diagnosis not present

## 2022-01-24 DIAGNOSIS — R197 Diarrhea, unspecified: Secondary | ICD-10-CM | POA: Diagnosis not present

## 2022-01-24 DIAGNOSIS — N2581 Secondary hyperparathyroidism of renal origin: Secondary | ICD-10-CM | POA: Diagnosis not present

## 2022-01-24 DIAGNOSIS — E875 Hyperkalemia: Secondary | ICD-10-CM | POA: Diagnosis not present

## 2022-01-24 DIAGNOSIS — Z992 Dependence on renal dialysis: Secondary | ICD-10-CM | POA: Diagnosis not present

## 2022-01-24 DIAGNOSIS — D631 Anemia in chronic kidney disease: Secondary | ICD-10-CM | POA: Diagnosis not present

## 2022-01-24 DIAGNOSIS — D689 Coagulation defect, unspecified: Secondary | ICD-10-CM | POA: Diagnosis not present

## 2022-01-26 DIAGNOSIS — R519 Headache, unspecified: Secondary | ICD-10-CM | POA: Diagnosis not present

## 2022-01-26 DIAGNOSIS — Z992 Dependence on renal dialysis: Secondary | ICD-10-CM | POA: Diagnosis not present

## 2022-01-26 DIAGNOSIS — D689 Coagulation defect, unspecified: Secondary | ICD-10-CM | POA: Diagnosis not present

## 2022-01-26 DIAGNOSIS — E875 Hyperkalemia: Secondary | ICD-10-CM | POA: Diagnosis not present

## 2022-01-26 DIAGNOSIS — Z7682 Awaiting organ transplant status: Secondary | ICD-10-CM | POA: Diagnosis not present

## 2022-01-26 DIAGNOSIS — N2581 Secondary hyperparathyroidism of renal origin: Secondary | ICD-10-CM | POA: Diagnosis not present

## 2022-01-26 DIAGNOSIS — R197 Diarrhea, unspecified: Secondary | ICD-10-CM | POA: Diagnosis not present

## 2022-01-26 DIAGNOSIS — D631 Anemia in chronic kidney disease: Secondary | ICD-10-CM | POA: Diagnosis not present

## 2022-01-26 DIAGNOSIS — N186 End stage renal disease: Secondary | ICD-10-CM | POA: Diagnosis not present

## 2022-01-29 DIAGNOSIS — D689 Coagulation defect, unspecified: Secondary | ICD-10-CM | POA: Diagnosis not present

## 2022-01-29 DIAGNOSIS — N2581 Secondary hyperparathyroidism of renal origin: Secondary | ICD-10-CM | POA: Diagnosis not present

## 2022-01-29 DIAGNOSIS — D631 Anemia in chronic kidney disease: Secondary | ICD-10-CM | POA: Diagnosis not present

## 2022-01-29 DIAGNOSIS — R197 Diarrhea, unspecified: Secondary | ICD-10-CM | POA: Diagnosis not present

## 2022-01-29 DIAGNOSIS — N186 End stage renal disease: Secondary | ICD-10-CM | POA: Diagnosis not present

## 2022-01-29 DIAGNOSIS — E875 Hyperkalemia: Secondary | ICD-10-CM | POA: Diagnosis not present

## 2022-01-29 DIAGNOSIS — Z7682 Awaiting organ transplant status: Secondary | ICD-10-CM | POA: Diagnosis not present

## 2022-01-29 DIAGNOSIS — Z992 Dependence on renal dialysis: Secondary | ICD-10-CM | POA: Diagnosis not present

## 2022-01-29 DIAGNOSIS — R519 Headache, unspecified: Secondary | ICD-10-CM | POA: Diagnosis not present

## 2022-01-30 DIAGNOSIS — H6123 Impacted cerumen, bilateral: Secondary | ICD-10-CM | POA: Diagnosis not present

## 2022-01-31 DIAGNOSIS — Z992 Dependence on renal dialysis: Secondary | ICD-10-CM | POA: Diagnosis not present

## 2022-01-31 DIAGNOSIS — N2581 Secondary hyperparathyroidism of renal origin: Secondary | ICD-10-CM | POA: Diagnosis not present

## 2022-01-31 DIAGNOSIS — N186 End stage renal disease: Secondary | ICD-10-CM | POA: Diagnosis not present

## 2022-01-31 DIAGNOSIS — R197 Diarrhea, unspecified: Secondary | ICD-10-CM | POA: Diagnosis not present

## 2022-01-31 DIAGNOSIS — R519 Headache, unspecified: Secondary | ICD-10-CM | POA: Diagnosis not present

## 2022-01-31 DIAGNOSIS — E875 Hyperkalemia: Secondary | ICD-10-CM | POA: Diagnosis not present

## 2022-01-31 DIAGNOSIS — D689 Coagulation defect, unspecified: Secondary | ICD-10-CM | POA: Diagnosis not present

## 2022-01-31 DIAGNOSIS — D631 Anemia in chronic kidney disease: Secondary | ICD-10-CM | POA: Diagnosis not present

## 2022-01-31 DIAGNOSIS — Z7682 Awaiting organ transplant status: Secondary | ICD-10-CM | POA: Diagnosis not present

## 2022-02-02 DIAGNOSIS — D631 Anemia in chronic kidney disease: Secondary | ICD-10-CM | POA: Diagnosis not present

## 2022-02-02 DIAGNOSIS — N2581 Secondary hyperparathyroidism of renal origin: Secondary | ICD-10-CM | POA: Diagnosis not present

## 2022-02-02 DIAGNOSIS — Z7682 Awaiting organ transplant status: Secondary | ICD-10-CM | POA: Diagnosis not present

## 2022-02-02 DIAGNOSIS — R197 Diarrhea, unspecified: Secondary | ICD-10-CM | POA: Diagnosis not present

## 2022-02-02 DIAGNOSIS — D689 Coagulation defect, unspecified: Secondary | ICD-10-CM | POA: Diagnosis not present

## 2022-02-02 DIAGNOSIS — Z992 Dependence on renal dialysis: Secondary | ICD-10-CM | POA: Diagnosis not present

## 2022-02-02 DIAGNOSIS — N186 End stage renal disease: Secondary | ICD-10-CM | POA: Diagnosis not present

## 2022-02-02 DIAGNOSIS — E875 Hyperkalemia: Secondary | ICD-10-CM | POA: Diagnosis not present

## 2022-02-02 DIAGNOSIS — R519 Headache, unspecified: Secondary | ICD-10-CM | POA: Diagnosis not present

## 2022-02-04 DIAGNOSIS — R197 Diarrhea, unspecified: Secondary | ICD-10-CM | POA: Diagnosis not present

## 2022-02-04 DIAGNOSIS — Z7682 Awaiting organ transplant status: Secondary | ICD-10-CM | POA: Diagnosis not present

## 2022-02-04 DIAGNOSIS — N2581 Secondary hyperparathyroidism of renal origin: Secondary | ICD-10-CM | POA: Diagnosis not present

## 2022-02-04 DIAGNOSIS — E875 Hyperkalemia: Secondary | ICD-10-CM | POA: Diagnosis not present

## 2022-02-04 DIAGNOSIS — Z992 Dependence on renal dialysis: Secondary | ICD-10-CM | POA: Diagnosis not present

## 2022-02-04 DIAGNOSIS — D689 Coagulation defect, unspecified: Secondary | ICD-10-CM | POA: Diagnosis not present

## 2022-02-04 DIAGNOSIS — N186 End stage renal disease: Secondary | ICD-10-CM | POA: Diagnosis not present

## 2022-02-04 DIAGNOSIS — D631 Anemia in chronic kidney disease: Secondary | ICD-10-CM | POA: Diagnosis not present

## 2022-02-04 DIAGNOSIS — R519 Headache, unspecified: Secondary | ICD-10-CM | POA: Diagnosis not present

## 2022-02-07 DIAGNOSIS — Z992 Dependence on renal dialysis: Secondary | ICD-10-CM | POA: Diagnosis not present

## 2022-02-07 DIAGNOSIS — R197 Diarrhea, unspecified: Secondary | ICD-10-CM | POA: Diagnosis not present

## 2022-02-07 DIAGNOSIS — R519 Headache, unspecified: Secondary | ICD-10-CM | POA: Diagnosis not present

## 2022-02-07 DIAGNOSIS — D689 Coagulation defect, unspecified: Secondary | ICD-10-CM | POA: Diagnosis not present

## 2022-02-07 DIAGNOSIS — N2581 Secondary hyperparathyroidism of renal origin: Secondary | ICD-10-CM | POA: Diagnosis not present

## 2022-02-07 DIAGNOSIS — N186 End stage renal disease: Secondary | ICD-10-CM | POA: Diagnosis not present

## 2022-02-07 DIAGNOSIS — D631 Anemia in chronic kidney disease: Secondary | ICD-10-CM | POA: Diagnosis not present

## 2022-02-07 DIAGNOSIS — E875 Hyperkalemia: Secondary | ICD-10-CM | POA: Diagnosis not present

## 2022-02-07 DIAGNOSIS — Z7682 Awaiting organ transplant status: Secondary | ICD-10-CM | POA: Diagnosis not present

## 2022-02-09 DIAGNOSIS — Z992 Dependence on renal dialysis: Secondary | ICD-10-CM | POA: Diagnosis not present

## 2022-02-09 DIAGNOSIS — D689 Coagulation defect, unspecified: Secondary | ICD-10-CM | POA: Diagnosis not present

## 2022-02-09 DIAGNOSIS — Z7682 Awaiting organ transplant status: Secondary | ICD-10-CM | POA: Diagnosis not present

## 2022-02-09 DIAGNOSIS — R519 Headache, unspecified: Secondary | ICD-10-CM | POA: Diagnosis not present

## 2022-02-09 DIAGNOSIS — R197 Diarrhea, unspecified: Secondary | ICD-10-CM | POA: Diagnosis not present

## 2022-02-09 DIAGNOSIS — N2581 Secondary hyperparathyroidism of renal origin: Secondary | ICD-10-CM | POA: Diagnosis not present

## 2022-02-09 DIAGNOSIS — E875 Hyperkalemia: Secondary | ICD-10-CM | POA: Diagnosis not present

## 2022-02-09 DIAGNOSIS — N186 End stage renal disease: Secondary | ICD-10-CM | POA: Diagnosis not present

## 2022-02-09 DIAGNOSIS — D631 Anemia in chronic kidney disease: Secondary | ICD-10-CM | POA: Diagnosis not present

## 2022-02-11 DIAGNOSIS — E875 Hyperkalemia: Secondary | ICD-10-CM | POA: Diagnosis not present

## 2022-02-11 DIAGNOSIS — D689 Coagulation defect, unspecified: Secondary | ICD-10-CM | POA: Diagnosis not present

## 2022-02-11 DIAGNOSIS — Z7682 Awaiting organ transplant status: Secondary | ICD-10-CM | POA: Diagnosis not present

## 2022-02-11 DIAGNOSIS — R197 Diarrhea, unspecified: Secondary | ICD-10-CM | POA: Diagnosis not present

## 2022-02-11 DIAGNOSIS — N2581 Secondary hyperparathyroidism of renal origin: Secondary | ICD-10-CM | POA: Diagnosis not present

## 2022-02-11 DIAGNOSIS — R519 Headache, unspecified: Secondary | ICD-10-CM | POA: Diagnosis not present

## 2022-02-11 DIAGNOSIS — N186 End stage renal disease: Secondary | ICD-10-CM | POA: Diagnosis not present

## 2022-02-11 DIAGNOSIS — I12 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 stage renal disease: Secondary | ICD-10-CM | POA: Diagnosis not present

## 2022-02-11 DIAGNOSIS — D631 Anemia in chronic kidney disease: Secondary | ICD-10-CM | POA: Diagnosis not present

## 2022-02-11 DIAGNOSIS — Z992 Dependence on renal dialysis: Secondary | ICD-10-CM | POA: Diagnosis not present

## 2022-02-14 DIAGNOSIS — Z992 Dependence on renal dialysis: Secondary | ICD-10-CM | POA: Diagnosis not present

## 2022-02-14 DIAGNOSIS — N2581 Secondary hyperparathyroidism of renal origin: Secondary | ICD-10-CM | POA: Diagnosis not present

## 2022-02-14 DIAGNOSIS — E875 Hyperkalemia: Secondary | ICD-10-CM | POA: Diagnosis not present

## 2022-02-14 DIAGNOSIS — D631 Anemia in chronic kidney disease: Secondary | ICD-10-CM | POA: Diagnosis not present

## 2022-02-14 DIAGNOSIS — Z7682 Awaiting organ transplant status: Secondary | ICD-10-CM | POA: Diagnosis not present

## 2022-02-14 DIAGNOSIS — D509 Iron deficiency anemia, unspecified: Secondary | ICD-10-CM | POA: Diagnosis not present

## 2022-02-14 DIAGNOSIS — E1129 Type 2 diabetes mellitus with other diabetic kidney complication: Secondary | ICD-10-CM | POA: Diagnosis not present

## 2022-02-14 DIAGNOSIS — D689 Coagulation defect, unspecified: Secondary | ICD-10-CM | POA: Diagnosis not present

## 2022-02-14 DIAGNOSIS — N186 End stage renal disease: Secondary | ICD-10-CM | POA: Diagnosis not present

## 2022-02-15 ENCOUNTER — Ambulatory Visit: Payer: Medicare Other | Admitting: Neurology

## 2022-02-16 ENCOUNTER — Telehealth: Payer: Self-pay | Admitting: Orthopaedic Surgery

## 2022-02-16 DIAGNOSIS — E875 Hyperkalemia: Secondary | ICD-10-CM | POA: Diagnosis not present

## 2022-02-16 DIAGNOSIS — Z992 Dependence on renal dialysis: Secondary | ICD-10-CM | POA: Diagnosis not present

## 2022-02-16 DIAGNOSIS — E1129 Type 2 diabetes mellitus with other diabetic kidney complication: Secondary | ICD-10-CM | POA: Diagnosis not present

## 2022-02-16 DIAGNOSIS — Z7682 Awaiting organ transplant status: Secondary | ICD-10-CM | POA: Diagnosis not present

## 2022-02-16 DIAGNOSIS — N2581 Secondary hyperparathyroidism of renal origin: Secondary | ICD-10-CM | POA: Diagnosis not present

## 2022-02-16 DIAGNOSIS — D689 Coagulation defect, unspecified: Secondary | ICD-10-CM | POA: Diagnosis not present

## 2022-02-16 DIAGNOSIS — D509 Iron deficiency anemia, unspecified: Secondary | ICD-10-CM | POA: Diagnosis not present

## 2022-02-16 DIAGNOSIS — D631 Anemia in chronic kidney disease: Secondary | ICD-10-CM | POA: Diagnosis not present

## 2022-02-16 DIAGNOSIS — N186 End stage renal disease: Secondary | ICD-10-CM | POA: Diagnosis not present

## 2022-02-16 NOTE — Telephone Encounter (Signed)
Pt has been waiting for a call back from [employer]. Pt states she will know what he is calling for. Please call pt at [phone].

## 2022-02-16 NOTE — Telephone Encounter (Signed)
Spoke with patient. He has his CPT code and prescription. He said that his insurance is approving the inserts. He hasn't got them yet from the [facility]. He will keep me posted if there is something more he needs.

## 2022-02-19 DIAGNOSIS — Z7682 Awaiting organ transplant status: Secondary | ICD-10-CM | POA: Diagnosis not present

## 2022-02-19 DIAGNOSIS — N2581 Secondary hyperparathyroidism of renal origin: Secondary | ICD-10-CM | POA: Diagnosis not present

## 2022-02-19 DIAGNOSIS — N186 End stage renal disease: Secondary | ICD-10-CM | POA: Diagnosis not present

## 2022-02-19 DIAGNOSIS — D631 Anemia in chronic kidney disease: Secondary | ICD-10-CM | POA: Diagnosis not present

## 2022-02-19 DIAGNOSIS — D689 Coagulation defect, unspecified: Secondary | ICD-10-CM | POA: Diagnosis not present

## 2022-02-19 DIAGNOSIS — Z992 Dependence on renal dialysis: Secondary | ICD-10-CM | POA: Diagnosis not present

## 2022-02-19 DIAGNOSIS — E1129 Type 2 diabetes mellitus with other diabetic kidney complication: Secondary | ICD-10-CM | POA: Diagnosis not present

## 2022-02-19 DIAGNOSIS — E875 Hyperkalemia: Secondary | ICD-10-CM | POA: Diagnosis not present

## 2022-02-19 DIAGNOSIS — D509 Iron deficiency anemia, unspecified: Secondary | ICD-10-CM | POA: Diagnosis not present

## 2022-02-21 ENCOUNTER — Telehealth: Payer: Self-pay | Admitting: *Deleted

## 2022-02-21 ENCOUNTER — Ambulatory Visit: Payer: Medicare Other | Admitting: Neurology

## 2022-02-21 DIAGNOSIS — N2581 Secondary hyperparathyroidism of renal origin: Secondary | ICD-10-CM | POA: Diagnosis not present

## 2022-02-21 DIAGNOSIS — Z7682 Awaiting organ transplant status: Secondary | ICD-10-CM | POA: Diagnosis not present

## 2022-02-21 DIAGNOSIS — Z992 Dependence on renal dialysis: Secondary | ICD-10-CM | POA: Diagnosis not present

## 2022-02-21 DIAGNOSIS — N186 End stage renal disease: Secondary | ICD-10-CM | POA: Diagnosis not present

## 2022-02-21 DIAGNOSIS — D631 Anemia in chronic kidney disease: Secondary | ICD-10-CM | POA: Diagnosis not present

## 2022-02-21 DIAGNOSIS — E875 Hyperkalemia: Secondary | ICD-10-CM | POA: Diagnosis not present

## 2022-02-21 DIAGNOSIS — D689 Coagulation defect, unspecified: Secondary | ICD-10-CM | POA: Diagnosis not present

## 2022-02-21 DIAGNOSIS — D509 Iron deficiency anemia, unspecified: Secondary | ICD-10-CM | POA: Diagnosis not present

## 2022-02-21 DIAGNOSIS — E1129 Type 2 diabetes mellitus with other diabetic kidney complication: Secondary | ICD-10-CM | POA: Diagnosis not present

## 2022-02-21 NOTE — Patient Outreach (Signed)
  Care Coordination   02/21/2022 Name: Jihaad Bruschi Sr. MRN: [id number] DOB: 25-Nov-1960   Care Coordination Outreach Attempts:  An unsuccessful telephone outreach was attempted today to offer the patient information about available care coordination services as a benefit of their health plan.   Follow Up Plan:  Additional outreach attempts will be made to offer the patient care coordination information and services.   Encounter Outcome:  No Answer   Care Coordination Interventions:  No, not indicated    Raina Mina, RN Care Management Coordinator [place] [facility] [phone]

## 2022-02-23 DIAGNOSIS — D689 Coagulation defect, unspecified: Secondary | ICD-10-CM | POA: Diagnosis not present

## 2022-02-23 DIAGNOSIS — N186 End stage renal disease: Secondary | ICD-10-CM | POA: Diagnosis not present

## 2022-02-23 DIAGNOSIS — D509 Iron deficiency anemia, unspecified: Secondary | ICD-10-CM | POA: Diagnosis not present

## 2022-02-23 DIAGNOSIS — E1129 Type 2 diabetes mellitus with other diabetic kidney complication: Secondary | ICD-10-CM | POA: Diagnosis not present

## 2022-02-23 DIAGNOSIS — Z992 Dependence on renal dialysis: Secondary | ICD-10-CM | POA: Diagnosis not present

## 2022-02-23 DIAGNOSIS — N2581 Secondary hyperparathyroidism of renal origin: Secondary | ICD-10-CM | POA: Diagnosis not present

## 2022-02-23 DIAGNOSIS — Z7682 Awaiting organ transplant status: Secondary | ICD-10-CM | POA: Diagnosis not present

## 2022-02-23 DIAGNOSIS — D631 Anemia in chronic kidney disease: Secondary | ICD-10-CM | POA: Diagnosis not present

## 2022-02-23 DIAGNOSIS — E875 Hyperkalemia: Secondary | ICD-10-CM | POA: Diagnosis not present

## 2022-02-26 ENCOUNTER — Encounter: Payer: Self-pay | Admitting: Internal Medicine

## 2022-02-26 DIAGNOSIS — E1129 Type 2 diabetes mellitus with other diabetic kidney complication: Secondary | ICD-10-CM | POA: Diagnosis not present

## 2022-02-26 DIAGNOSIS — N186 End stage renal disease: Secondary | ICD-10-CM | POA: Diagnosis not present

## 2022-02-26 DIAGNOSIS — Z992 Dependence on renal dialysis: Secondary | ICD-10-CM | POA: Diagnosis not present

## 2022-02-26 DIAGNOSIS — D509 Iron deficiency anemia, unspecified: Secondary | ICD-10-CM | POA: Diagnosis not present

## 2022-02-26 DIAGNOSIS — N2581 Secondary hyperparathyroidism of renal origin: Secondary | ICD-10-CM | POA: Diagnosis not present

## 2022-02-26 DIAGNOSIS — Z7682 Awaiting organ transplant status: Secondary | ICD-10-CM | POA: Diagnosis not present

## 2022-02-26 DIAGNOSIS — E875 Hyperkalemia: Secondary | ICD-10-CM | POA: Diagnosis not present

## 2022-02-26 DIAGNOSIS — D631 Anemia in chronic kidney disease: Secondary | ICD-10-CM | POA: Diagnosis not present

## 2022-02-26 DIAGNOSIS — D689 Coagulation defect, unspecified: Secondary | ICD-10-CM | POA: Diagnosis not present

## 2022-02-27 ENCOUNTER — Encounter: Payer: Medicare Other | Admitting: Neurology

## 2022-02-28 DIAGNOSIS — D509 Iron deficiency anemia, unspecified: Secondary | ICD-10-CM | POA: Diagnosis not present

## 2022-02-28 DIAGNOSIS — Z7682 Awaiting organ transplant status: Secondary | ICD-10-CM | POA: Diagnosis not present

## 2022-02-28 DIAGNOSIS — E875 Hyperkalemia: Secondary | ICD-10-CM | POA: Diagnosis not present

## 2022-02-28 DIAGNOSIS — N2581 Secondary hyperparathyroidism of renal origin: Secondary | ICD-10-CM | POA: Diagnosis not present

## 2022-02-28 DIAGNOSIS — N186 End stage renal disease: Secondary | ICD-10-CM | POA: Diagnosis not present

## 2022-02-28 DIAGNOSIS — Z992 Dependence on renal dialysis: Secondary | ICD-10-CM | POA: Diagnosis not present

## 2022-02-28 DIAGNOSIS — D689 Coagulation defect, unspecified: Secondary | ICD-10-CM | POA: Diagnosis not present

## 2022-02-28 DIAGNOSIS — D631 Anemia in chronic kidney disease: Secondary | ICD-10-CM | POA: Diagnosis not present

## 2022-02-28 DIAGNOSIS — E1129 Type 2 diabetes mellitus with other diabetic kidney complication: Secondary | ICD-10-CM | POA: Diagnosis not present

## 2022-03-01 DIAGNOSIS — Z01818 Encounter for other preprocedural examination: Secondary | ICD-10-CM | POA: Diagnosis not present

## 2022-03-01 DIAGNOSIS — R918 Other nonspecific abnormal finding of lung field: Secondary | ICD-10-CM | POA: Diagnosis not present

## 2022-03-02 DIAGNOSIS — Z7682 Awaiting organ transplant status: Secondary | ICD-10-CM | POA: Diagnosis not present

## 2022-03-02 DIAGNOSIS — D631 Anemia in chronic kidney disease: Secondary | ICD-10-CM | POA: Diagnosis not present

## 2022-03-02 DIAGNOSIS — N2581 Secondary hyperparathyroidism of renal origin: Secondary | ICD-10-CM | POA: Diagnosis not present

## 2022-03-02 DIAGNOSIS — D689 Coagulation defect, unspecified: Secondary | ICD-10-CM | POA: Diagnosis not present

## 2022-03-02 DIAGNOSIS — N186 End stage renal disease: Secondary | ICD-10-CM | POA: Diagnosis not present

## 2022-03-02 DIAGNOSIS — Z992 Dependence on renal dialysis: Secondary | ICD-10-CM | POA: Diagnosis not present

## 2022-03-02 DIAGNOSIS — E1129 Type 2 diabetes mellitus with other diabetic kidney complication: Secondary | ICD-10-CM | POA: Diagnosis not present

## 2022-03-02 DIAGNOSIS — D509 Iron deficiency anemia, unspecified: Secondary | ICD-10-CM | POA: Diagnosis not present

## 2022-03-02 DIAGNOSIS — E875 Hyperkalemia: Secondary | ICD-10-CM | POA: Diagnosis not present

## 2022-03-04 ENCOUNTER — Encounter (HOSPITAL_BASED_OUTPATIENT_CLINIC_OR_DEPARTMENT_OTHER): Payer: Self-pay | Admitting: Cardiology

## 2022-03-05 DIAGNOSIS — E1129 Type 2 diabetes mellitus with other diabetic kidney complication: Secondary | ICD-10-CM | POA: Diagnosis not present

## 2022-03-05 DIAGNOSIS — N2581 Secondary hyperparathyroidism of renal origin: Secondary | ICD-10-CM | POA: Diagnosis not present

## 2022-03-05 DIAGNOSIS — D689 Coagulation defect, unspecified: Secondary | ICD-10-CM | POA: Diagnosis not present

## 2022-03-05 DIAGNOSIS — D631 Anemia in chronic kidney disease: Secondary | ICD-10-CM | POA: Diagnosis not present

## 2022-03-05 DIAGNOSIS — Z7682 Awaiting organ transplant status: Secondary | ICD-10-CM | POA: Diagnosis not present

## 2022-03-05 DIAGNOSIS — N186 End stage renal disease: Secondary | ICD-10-CM | POA: Diagnosis not present

## 2022-03-05 DIAGNOSIS — E875 Hyperkalemia: Secondary | ICD-10-CM | POA: Diagnosis not present

## 2022-03-05 DIAGNOSIS — D509 Iron deficiency anemia, unspecified: Secondary | ICD-10-CM | POA: Diagnosis not present

## 2022-03-05 DIAGNOSIS — Z992 Dependence on renal dialysis: Secondary | ICD-10-CM | POA: Diagnosis not present

## 2022-03-07 DIAGNOSIS — E1129 Type 2 diabetes mellitus with other diabetic kidney complication: Secondary | ICD-10-CM | POA: Diagnosis not present

## 2022-03-07 DIAGNOSIS — Z7682 Awaiting organ transplant status: Secondary | ICD-10-CM | POA: Diagnosis not present

## 2022-03-07 DIAGNOSIS — D631 Anemia in chronic kidney disease: Secondary | ICD-10-CM | POA: Diagnosis not present

## 2022-03-07 DIAGNOSIS — Z992 Dependence on renal dialysis: Secondary | ICD-10-CM | POA: Diagnosis not present

## 2022-03-07 DIAGNOSIS — D509 Iron deficiency anemia, unspecified: Secondary | ICD-10-CM | POA: Diagnosis not present

## 2022-03-07 DIAGNOSIS — N186 End stage renal disease: Secondary | ICD-10-CM | POA: Diagnosis not present

## 2022-03-07 DIAGNOSIS — D689 Coagulation defect, unspecified: Secondary | ICD-10-CM | POA: Diagnosis not present

## 2022-03-07 DIAGNOSIS — N2581 Secondary hyperparathyroidism of renal origin: Secondary | ICD-10-CM | POA: Diagnosis not present

## 2022-03-07 DIAGNOSIS — E875 Hyperkalemia: Secondary | ICD-10-CM | POA: Diagnosis not present

## 2022-03-08 DIAGNOSIS — I700 Atherosclerosis of aorta: Secondary | ICD-10-CM | POA: Diagnosis not present

## 2022-03-08 DIAGNOSIS — Z992 Dependence on renal dialysis: Secondary | ICD-10-CM | POA: Diagnosis not present

## 2022-03-08 DIAGNOSIS — G63 Polyneuropathy in diseases classified elsewhere: Secondary | ICD-10-CM | POA: Diagnosis not present

## 2022-03-08 DIAGNOSIS — Z0001 Encounter for general adult medical examination with abnormal findings: Secondary | ICD-10-CM | POA: Diagnosis not present

## 2022-03-08 DIAGNOSIS — Z0000 Encounter for general adult medical examination without abnormal findings: Secondary | ICD-10-CM | POA: Diagnosis not present

## 2022-03-08 DIAGNOSIS — N2581 Secondary hyperparathyroidism of renal origin: Secondary | ICD-10-CM | POA: Diagnosis not present

## 2022-03-08 DIAGNOSIS — E7800 Pure hypercholesterolemia, unspecified: Secondary | ICD-10-CM | POA: Diagnosis not present

## 2022-03-08 DIAGNOSIS — N186 End stage renal disease: Secondary | ICD-10-CM | POA: Diagnosis not present

## 2022-03-08 DIAGNOSIS — Z79899 Other long term (current) drug therapy: Secondary | ICD-10-CM | POA: Diagnosis not present

## 2022-03-08 DIAGNOSIS — K269 Duodenal ulcer, unspecified as acute or chronic, without hemorrhage or perforation: Secondary | ICD-10-CM | POA: Diagnosis not present

## 2022-03-09 DIAGNOSIS — E875 Hyperkalemia: Secondary | ICD-10-CM | POA: Diagnosis not present

## 2022-03-09 DIAGNOSIS — Z992 Dependence on renal dialysis: Secondary | ICD-10-CM | POA: Diagnosis not present

## 2022-03-09 DIAGNOSIS — E1129 Type 2 diabetes mellitus with other diabetic kidney complication: Secondary | ICD-10-CM | POA: Diagnosis not present

## 2022-03-09 DIAGNOSIS — N186 End stage renal disease: Secondary | ICD-10-CM | POA: Diagnosis not present

## 2022-03-09 DIAGNOSIS — D689 Coagulation defect, unspecified: Secondary | ICD-10-CM | POA: Diagnosis not present

## 2022-03-09 DIAGNOSIS — D631 Anemia in chronic kidney disease: Secondary | ICD-10-CM | POA: Diagnosis not present

## 2022-03-09 DIAGNOSIS — Z7682 Awaiting organ transplant status: Secondary | ICD-10-CM | POA: Diagnosis not present

## 2022-03-09 DIAGNOSIS — N2581 Secondary hyperparathyroidism of renal origin: Secondary | ICD-10-CM | POA: Diagnosis not present

## 2022-03-09 DIAGNOSIS — D509 Iron deficiency anemia, unspecified: Secondary | ICD-10-CM | POA: Diagnosis not present

## 2022-03-12 DIAGNOSIS — E875 Hyperkalemia: Secondary | ICD-10-CM | POA: Diagnosis not present

## 2022-03-12 DIAGNOSIS — Z7682 Awaiting organ transplant status: Secondary | ICD-10-CM | POA: Diagnosis not present

## 2022-03-12 DIAGNOSIS — Z992 Dependence on renal dialysis: Secondary | ICD-10-CM | POA: Diagnosis not present

## 2022-03-12 DIAGNOSIS — N2581 Secondary hyperparathyroidism of renal origin: Secondary | ICD-10-CM | POA: Diagnosis not present

## 2022-03-12 DIAGNOSIS — E1129 Type 2 diabetes mellitus with other diabetic kidney complication: Secondary | ICD-10-CM | POA: Diagnosis not present

## 2022-03-12 DIAGNOSIS — D689 Coagulation defect, unspecified: Secondary | ICD-10-CM | POA: Diagnosis not present

## 2022-03-12 DIAGNOSIS — N186 End stage renal disease: Secondary | ICD-10-CM | POA: Diagnosis not present

## 2022-03-12 DIAGNOSIS — D631 Anemia in chronic kidney disease: Secondary | ICD-10-CM | POA: Diagnosis not present

## 2022-03-12 DIAGNOSIS — D509 Iron deficiency anemia, unspecified: Secondary | ICD-10-CM | POA: Diagnosis not present

## 2022-03-14 DIAGNOSIS — Z992 Dependence on renal dialysis: Secondary | ICD-10-CM | POA: Diagnosis not present

## 2022-03-14 DIAGNOSIS — E875 Hyperkalemia: Secondary | ICD-10-CM | POA: Diagnosis not present

## 2022-03-14 DIAGNOSIS — D509 Iron deficiency anemia, unspecified: Secondary | ICD-10-CM | POA: Diagnosis not present

## 2022-03-14 DIAGNOSIS — D689 Coagulation defect, unspecified: Secondary | ICD-10-CM | POA: Diagnosis not present

## 2022-03-14 DIAGNOSIS — N186 End stage renal disease: Secondary | ICD-10-CM | POA: Diagnosis not present

## 2022-03-14 DIAGNOSIS — I12 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 stage renal disease: Secondary | ICD-10-CM | POA: Diagnosis not present

## 2022-03-14 DIAGNOSIS — E1129 Type 2 diabetes mellitus with other diabetic kidney complication: Secondary | ICD-10-CM | POA: Diagnosis not present

## 2022-03-14 DIAGNOSIS — Z7682 Awaiting organ transplant status: Secondary | ICD-10-CM | POA: Diagnosis not present

## 2022-03-14 DIAGNOSIS — D631 Anemia in chronic kidney disease: Secondary | ICD-10-CM | POA: Diagnosis not present

## 2022-03-14 DIAGNOSIS — N2581 Secondary hyperparathyroidism of renal origin: Secondary | ICD-10-CM | POA: Diagnosis not present

## 2022-03-16 DIAGNOSIS — N186 End stage renal disease: Secondary | ICD-10-CM | POA: Diagnosis not present

## 2022-03-16 DIAGNOSIS — Z7682 Awaiting organ transplant status: Secondary | ICD-10-CM | POA: Diagnosis not present

## 2022-03-16 DIAGNOSIS — Z992 Dependence on renal dialysis: Secondary | ICD-10-CM | POA: Diagnosis not present

## 2022-03-16 DIAGNOSIS — D631 Anemia in chronic kidney disease: Secondary | ICD-10-CM | POA: Diagnosis not present

## 2022-03-16 DIAGNOSIS — N2581 Secondary hyperparathyroidism of renal origin: Secondary | ICD-10-CM | POA: Diagnosis not present

## 2022-03-16 DIAGNOSIS — R519 Headache, unspecified: Secondary | ICD-10-CM | POA: Diagnosis not present

## 2022-03-16 DIAGNOSIS — E875 Hyperkalemia: Secondary | ICD-10-CM | POA: Diagnosis not present

## 2022-03-16 DIAGNOSIS — D689 Coagulation defect, unspecified: Secondary | ICD-10-CM | POA: Diagnosis not present

## 2022-03-19 DIAGNOSIS — R519 Headache, unspecified: Secondary | ICD-10-CM | POA: Diagnosis not present

## 2022-03-19 DIAGNOSIS — D689 Coagulation defect, unspecified: Secondary | ICD-10-CM | POA: Diagnosis not present

## 2022-03-19 DIAGNOSIS — Z7682 Awaiting organ transplant status: Secondary | ICD-10-CM | POA: Diagnosis not present

## 2022-03-19 DIAGNOSIS — N2581 Secondary hyperparathyroidism of renal origin: Secondary | ICD-10-CM | POA: Diagnosis not present

## 2022-03-19 DIAGNOSIS — Z992 Dependence on renal dialysis: Secondary | ICD-10-CM | POA: Diagnosis not present

## 2022-03-19 DIAGNOSIS — N186 End stage renal disease: Secondary | ICD-10-CM | POA: Diagnosis not present

## 2022-03-19 DIAGNOSIS — E875 Hyperkalemia: Secondary | ICD-10-CM | POA: Diagnosis not present

## 2022-03-19 DIAGNOSIS — D631 Anemia in chronic kidney disease: Secondary | ICD-10-CM | POA: Diagnosis not present

## 2022-03-21 DIAGNOSIS — D631 Anemia in chronic kidney disease: Secondary | ICD-10-CM | POA: Diagnosis not present

## 2022-03-21 DIAGNOSIS — Z7682 Awaiting organ transplant status: Secondary | ICD-10-CM | POA: Diagnosis not present

## 2022-03-21 DIAGNOSIS — D689 Coagulation defect, unspecified: Secondary | ICD-10-CM | POA: Diagnosis not present

## 2022-03-21 DIAGNOSIS — R519 Headache, unspecified: Secondary | ICD-10-CM | POA: Diagnosis not present

## 2022-03-21 DIAGNOSIS — E875 Hyperkalemia: Secondary | ICD-10-CM | POA: Diagnosis not present

## 2022-03-21 DIAGNOSIS — N186 End stage renal disease: Secondary | ICD-10-CM | POA: Diagnosis not present

## 2022-03-21 DIAGNOSIS — Z992 Dependence on renal dialysis: Secondary | ICD-10-CM | POA: Diagnosis not present

## 2022-03-21 DIAGNOSIS — N2581 Secondary hyperparathyroidism of renal origin: Secondary | ICD-10-CM | POA: Diagnosis not present

## 2022-03-23 DIAGNOSIS — N186 End stage renal disease: Secondary | ICD-10-CM | POA: Diagnosis not present

## 2022-03-23 DIAGNOSIS — Z7682 Awaiting organ transplant status: Secondary | ICD-10-CM | POA: Diagnosis not present

## 2022-03-23 DIAGNOSIS — R519 Headache, unspecified: Secondary | ICD-10-CM | POA: Diagnosis not present

## 2022-03-23 DIAGNOSIS — D689 Coagulation defect, unspecified: Secondary | ICD-10-CM | POA: Diagnosis not present

## 2022-03-23 DIAGNOSIS — Z992 Dependence on renal dialysis: Secondary | ICD-10-CM | POA: Diagnosis not present

## 2022-03-23 DIAGNOSIS — E875 Hyperkalemia: Secondary | ICD-10-CM | POA: Diagnosis not present

## 2022-03-23 DIAGNOSIS — D631 Anemia in chronic kidney disease: Secondary | ICD-10-CM | POA: Diagnosis not present

## 2022-03-23 DIAGNOSIS — N2581 Secondary hyperparathyroidism of renal origin: Secondary | ICD-10-CM | POA: Diagnosis not present

## 2022-03-26 DIAGNOSIS — N2581 Secondary hyperparathyroidism of renal origin: Secondary | ICD-10-CM | POA: Diagnosis not present

## 2022-03-26 DIAGNOSIS — R519 Headache, unspecified: Secondary | ICD-10-CM | POA: Diagnosis not present

## 2022-03-26 DIAGNOSIS — E875 Hyperkalemia: Secondary | ICD-10-CM | POA: Diagnosis not present

## 2022-03-26 DIAGNOSIS — Z7682 Awaiting organ transplant status: Secondary | ICD-10-CM | POA: Diagnosis not present

## 2022-03-26 DIAGNOSIS — Z992 Dependence on renal dialysis: Secondary | ICD-10-CM | POA: Diagnosis not present

## 2022-03-26 DIAGNOSIS — D689 Coagulation defect, unspecified: Secondary | ICD-10-CM | POA: Diagnosis not present

## 2022-03-26 DIAGNOSIS — D631 Anemia in chronic kidney disease: Secondary | ICD-10-CM | POA: Diagnosis not present

## 2022-03-26 DIAGNOSIS — N186 End stage renal disease: Secondary | ICD-10-CM | POA: Diagnosis not present

## 2022-03-28 DIAGNOSIS — N2581 Secondary hyperparathyroidism of renal origin: Secondary | ICD-10-CM | POA: Diagnosis not present

## 2022-03-28 DIAGNOSIS — Z7682 Awaiting organ transplant status: Secondary | ICD-10-CM | POA: Diagnosis not present

## 2022-03-28 DIAGNOSIS — N186 End stage renal disease: Secondary | ICD-10-CM | POA: Diagnosis not present

## 2022-03-28 DIAGNOSIS — Z992 Dependence on renal dialysis: Secondary | ICD-10-CM | POA: Diagnosis not present

## 2022-03-28 DIAGNOSIS — R519 Headache, unspecified: Secondary | ICD-10-CM | POA: Diagnosis not present

## 2022-03-28 DIAGNOSIS — D689 Coagulation defect, unspecified: Secondary | ICD-10-CM | POA: Diagnosis not present

## 2022-03-28 DIAGNOSIS — E875 Hyperkalemia: Secondary | ICD-10-CM | POA: Diagnosis not present

## 2022-03-28 DIAGNOSIS — D631 Anemia in chronic kidney disease: Secondary | ICD-10-CM | POA: Diagnosis not present

## 2022-03-30 DIAGNOSIS — R519 Headache, unspecified: Secondary | ICD-10-CM | POA: Diagnosis not present

## 2022-03-30 DIAGNOSIS — Z7682 Awaiting organ transplant status: Secondary | ICD-10-CM | POA: Diagnosis not present

## 2022-03-30 DIAGNOSIS — D689 Coagulation defect, unspecified: Secondary | ICD-10-CM | POA: Diagnosis not present

## 2022-03-30 DIAGNOSIS — N186 End stage renal disease: Secondary | ICD-10-CM | POA: Diagnosis not present

## 2022-03-30 DIAGNOSIS — E875 Hyperkalemia: Secondary | ICD-10-CM | POA: Diagnosis not present

## 2022-03-30 DIAGNOSIS — N2581 Secondary hyperparathyroidism of renal origin: Secondary | ICD-10-CM | POA: Diagnosis not present

## 2022-03-30 DIAGNOSIS — D631 Anemia in chronic kidney disease: Secondary | ICD-10-CM | POA: Diagnosis not present

## 2022-03-30 DIAGNOSIS — Z992 Dependence on renal dialysis: Secondary | ICD-10-CM | POA: Diagnosis not present

## 2022-04-02 DIAGNOSIS — R519 Headache, unspecified: Secondary | ICD-10-CM | POA: Diagnosis not present

## 2022-04-02 DIAGNOSIS — D689 Coagulation defect, unspecified: Secondary | ICD-10-CM | POA: Diagnosis not present

## 2022-04-02 DIAGNOSIS — E875 Hyperkalemia: Secondary | ICD-10-CM | POA: Diagnosis not present

## 2022-04-02 DIAGNOSIS — Z992 Dependence on renal dialysis: Secondary | ICD-10-CM | POA: Diagnosis not present

## 2022-04-02 DIAGNOSIS — Z7682 Awaiting organ transplant status: Secondary | ICD-10-CM | POA: Diagnosis not present

## 2022-04-02 DIAGNOSIS — D631 Anemia in chronic kidney disease: Secondary | ICD-10-CM | POA: Diagnosis not present

## 2022-04-02 DIAGNOSIS — N2581 Secondary hyperparathyroidism of renal origin: Secondary | ICD-10-CM | POA: Diagnosis not present

## 2022-04-02 DIAGNOSIS — N186 End stage renal disease: Secondary | ICD-10-CM | POA: Diagnosis not present

## 2022-04-04 DIAGNOSIS — E875 Hyperkalemia: Secondary | ICD-10-CM | POA: Diagnosis not present

## 2022-04-04 DIAGNOSIS — D689 Coagulation defect, unspecified: Secondary | ICD-10-CM | POA: Diagnosis not present

## 2022-04-04 DIAGNOSIS — R519 Headache, unspecified: Secondary | ICD-10-CM | POA: Diagnosis not present

## 2022-04-04 DIAGNOSIS — N186 End stage renal disease: Secondary | ICD-10-CM | POA: Diagnosis not present

## 2022-04-04 DIAGNOSIS — Z992 Dependence on renal dialysis: Secondary | ICD-10-CM | POA: Diagnosis not present

## 2022-04-04 DIAGNOSIS — N2581 Secondary hyperparathyroidism of renal origin: Secondary | ICD-10-CM | POA: Diagnosis not present

## 2022-04-04 DIAGNOSIS — Z7682 Awaiting organ transplant status: Secondary | ICD-10-CM | POA: Diagnosis not present

## 2022-04-04 DIAGNOSIS — D631 Anemia in chronic kidney disease: Secondary | ICD-10-CM | POA: Diagnosis not present

## 2022-04-06 DIAGNOSIS — Z7682 Awaiting organ transplant status: Secondary | ICD-10-CM | POA: Diagnosis not present

## 2022-04-06 DIAGNOSIS — D689 Coagulation defect, unspecified: Secondary | ICD-10-CM | POA: Diagnosis not present

## 2022-04-06 DIAGNOSIS — N186 End stage renal disease: Secondary | ICD-10-CM | POA: Diagnosis not present

## 2022-04-06 DIAGNOSIS — D631 Anemia in chronic kidney disease: Secondary | ICD-10-CM | POA: Diagnosis not present

## 2022-04-06 DIAGNOSIS — Z992 Dependence on renal dialysis: Secondary | ICD-10-CM | POA: Diagnosis not present

## 2022-04-06 DIAGNOSIS — E875 Hyperkalemia: Secondary | ICD-10-CM | POA: Diagnosis not present

## 2022-04-06 DIAGNOSIS — N2581 Secondary hyperparathyroidism of renal origin: Secondary | ICD-10-CM | POA: Diagnosis not present

## 2022-04-06 DIAGNOSIS — R519 Headache, unspecified: Secondary | ICD-10-CM | POA: Diagnosis not present

## 2022-04-09 DIAGNOSIS — D631 Anemia in chronic kidney disease: Secondary | ICD-10-CM | POA: Diagnosis not present

## 2022-04-09 DIAGNOSIS — E875 Hyperkalemia: Secondary | ICD-10-CM | POA: Diagnosis not present

## 2022-04-09 DIAGNOSIS — Z7682 Awaiting organ transplant status: Secondary | ICD-10-CM | POA: Diagnosis not present

## 2022-04-09 DIAGNOSIS — N2581 Secondary hyperparathyroidism of renal origin: Secondary | ICD-10-CM | POA: Diagnosis not present

## 2022-04-09 DIAGNOSIS — R519 Headache, unspecified: Secondary | ICD-10-CM | POA: Diagnosis not present

## 2022-04-09 DIAGNOSIS — D689 Coagulation defect, unspecified: Secondary | ICD-10-CM | POA: Diagnosis not present

## 2022-04-09 DIAGNOSIS — N186 End stage renal disease: Secondary | ICD-10-CM | POA: Diagnosis not present

## 2022-04-09 DIAGNOSIS — Z992 Dependence on renal dialysis: Secondary | ICD-10-CM | POA: Diagnosis not present

## 2022-04-11 DIAGNOSIS — D689 Coagulation defect, unspecified: Secondary | ICD-10-CM | POA: Diagnosis not present

## 2022-04-11 DIAGNOSIS — Z7682 Awaiting organ transplant status: Secondary | ICD-10-CM | POA: Diagnosis not present

## 2022-04-11 DIAGNOSIS — R519 Headache, unspecified: Secondary | ICD-10-CM | POA: Diagnosis not present

## 2022-04-11 DIAGNOSIS — D631 Anemia in chronic kidney disease: Secondary | ICD-10-CM | POA: Diagnosis not present

## 2022-04-11 DIAGNOSIS — Z992 Dependence on renal dialysis: Secondary | ICD-10-CM | POA: Diagnosis not present

## 2022-04-11 DIAGNOSIS — E875 Hyperkalemia: Secondary | ICD-10-CM | POA: Diagnosis not present

## 2022-04-11 DIAGNOSIS — N186 End stage renal disease: Secondary | ICD-10-CM | POA: Diagnosis not present

## 2022-04-11 DIAGNOSIS — N2581 Secondary hyperparathyroidism of renal origin: Secondary | ICD-10-CM | POA: Diagnosis not present

## 2022-04-12 DIAGNOSIS — N186 End stage renal disease: Secondary | ICD-10-CM | POA: Diagnosis not present

## 2022-04-12 DIAGNOSIS — Z992 Dependence on renal dialysis: Secondary | ICD-10-CM | POA: Diagnosis not present

## 2022-04-12 DIAGNOSIS — I12 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 stage renal disease: Secondary | ICD-10-CM | POA: Diagnosis not present

## 2022-04-13 DIAGNOSIS — Z992 Dependence on renal dialysis: Secondary | ICD-10-CM | POA: Diagnosis not present

## 2022-04-13 DIAGNOSIS — N186 End stage renal disease: Secondary | ICD-10-CM | POA: Diagnosis not present

## 2022-04-13 DIAGNOSIS — D689 Coagulation defect, unspecified: Secondary | ICD-10-CM | POA: Diagnosis not present

## 2022-04-13 DIAGNOSIS — R197 Diarrhea, unspecified: Secondary | ICD-10-CM | POA: Diagnosis not present

## 2022-04-13 DIAGNOSIS — D631 Anemia in chronic kidney disease: Secondary | ICD-10-CM | POA: Diagnosis not present

## 2022-04-13 DIAGNOSIS — E875 Hyperkalemia: Secondary | ICD-10-CM | POA: Diagnosis not present

## 2022-04-13 DIAGNOSIS — N2581 Secondary hyperparathyroidism of renal origin: Secondary | ICD-10-CM | POA: Diagnosis not present

## 2022-04-13 DIAGNOSIS — Z7682 Awaiting organ transplant status: Secondary | ICD-10-CM | POA: Diagnosis not present

## 2022-04-13 DIAGNOSIS — R519 Headache, unspecified: Secondary | ICD-10-CM | POA: Diagnosis not present

## 2022-04-16 ENCOUNTER — Encounter: Payer: 59 | Admitting: Neurology

## 2022-04-16 DIAGNOSIS — R197 Diarrhea, unspecified: Secondary | ICD-10-CM | POA: Diagnosis not present

## 2022-04-16 DIAGNOSIS — D631 Anemia in chronic kidney disease: Secondary | ICD-10-CM | POA: Diagnosis not present

## 2022-04-16 DIAGNOSIS — N186 End stage renal disease: Secondary | ICD-10-CM | POA: Diagnosis not present

## 2022-04-16 DIAGNOSIS — Z992 Dependence on renal dialysis: Secondary | ICD-10-CM | POA: Diagnosis not present

## 2022-04-16 DIAGNOSIS — D689 Coagulation defect, unspecified: Secondary | ICD-10-CM | POA: Diagnosis not present

## 2022-04-16 DIAGNOSIS — N2581 Secondary hyperparathyroidism of renal origin: Secondary | ICD-10-CM | POA: Diagnosis not present

## 2022-04-16 DIAGNOSIS — R519 Headache, unspecified: Secondary | ICD-10-CM | POA: Diagnosis not present

## 2022-04-16 DIAGNOSIS — Z7682 Awaiting organ transplant status: Secondary | ICD-10-CM | POA: Diagnosis not present

## 2022-04-16 DIAGNOSIS — E875 Hyperkalemia: Secondary | ICD-10-CM | POA: Diagnosis not present

## 2022-04-18 DIAGNOSIS — R519 Headache, unspecified: Secondary | ICD-10-CM | POA: Diagnosis not present

## 2022-04-18 DIAGNOSIS — Z7682 Awaiting organ transplant status: Secondary | ICD-10-CM | POA: Diagnosis not present

## 2022-04-18 DIAGNOSIS — N2581 Secondary hyperparathyroidism of renal origin: Secondary | ICD-10-CM | POA: Diagnosis not present

## 2022-04-18 DIAGNOSIS — Z992 Dependence on renal dialysis: Secondary | ICD-10-CM | POA: Diagnosis not present

## 2022-04-18 DIAGNOSIS — E875 Hyperkalemia: Secondary | ICD-10-CM | POA: Diagnosis not present

## 2022-04-18 DIAGNOSIS — R197 Diarrhea, unspecified: Secondary | ICD-10-CM | POA: Diagnosis not present

## 2022-04-18 DIAGNOSIS — D689 Coagulation defect, unspecified: Secondary | ICD-10-CM | POA: Diagnosis not present

## 2022-04-18 DIAGNOSIS — N186 End stage renal disease: Secondary | ICD-10-CM | POA: Diagnosis not present

## 2022-04-18 DIAGNOSIS — D631 Anemia in chronic kidney disease: Secondary | ICD-10-CM | POA: Diagnosis not present

## 2022-04-19 ENCOUNTER — Encounter: Payer: 59 | Admitting: Neurology

## 2022-04-20 DIAGNOSIS — Z992 Dependence on renal dialysis: Secondary | ICD-10-CM | POA: Diagnosis not present

## 2022-04-20 DIAGNOSIS — N2581 Secondary hyperparathyroidism of renal origin: Secondary | ICD-10-CM | POA: Diagnosis not present

## 2022-04-20 DIAGNOSIS — D631 Anemia in chronic kidney disease: Secondary | ICD-10-CM | POA: Diagnosis not present

## 2022-04-20 DIAGNOSIS — R519 Headache, unspecified: Secondary | ICD-10-CM | POA: Diagnosis not present

## 2022-04-20 DIAGNOSIS — N186 End stage renal disease: Secondary | ICD-10-CM | POA: Diagnosis not present

## 2022-04-20 DIAGNOSIS — D689 Coagulation defect, unspecified: Secondary | ICD-10-CM | POA: Diagnosis not present

## 2022-04-20 DIAGNOSIS — R197 Diarrhea, unspecified: Secondary | ICD-10-CM | POA: Diagnosis not present

## 2022-04-20 DIAGNOSIS — E875 Hyperkalemia: Secondary | ICD-10-CM | POA: Diagnosis not present

## 2022-04-20 DIAGNOSIS — Z7682 Awaiting organ transplant status: Secondary | ICD-10-CM | POA: Diagnosis not present

## 2022-04-23 DIAGNOSIS — R519 Headache, unspecified: Secondary | ICD-10-CM | POA: Diagnosis not present

## 2022-04-23 DIAGNOSIS — E875 Hyperkalemia: Secondary | ICD-10-CM | POA: Diagnosis not present

## 2022-04-23 DIAGNOSIS — N186 End stage renal disease: Secondary | ICD-10-CM | POA: Diagnosis not present

## 2022-04-23 DIAGNOSIS — N2581 Secondary hyperparathyroidism of renal origin: Secondary | ICD-10-CM | POA: Diagnosis not present

## 2022-04-23 DIAGNOSIS — R197 Diarrhea, unspecified: Secondary | ICD-10-CM | POA: Diagnosis not present

## 2022-04-23 DIAGNOSIS — Z992 Dependence on renal dialysis: Secondary | ICD-10-CM | POA: Diagnosis not present

## 2022-04-23 DIAGNOSIS — D631 Anemia in chronic kidney disease: Secondary | ICD-10-CM | POA: Diagnosis not present

## 2022-04-23 DIAGNOSIS — Z7682 Awaiting organ transplant status: Secondary | ICD-10-CM | POA: Diagnosis not present

## 2022-04-23 DIAGNOSIS — D689 Coagulation defect, unspecified: Secondary | ICD-10-CM | POA: Diagnosis not present

## 2022-04-25 DIAGNOSIS — N2581 Secondary hyperparathyroidism of renal origin: Secondary | ICD-10-CM | POA: Diagnosis not present

## 2022-04-25 DIAGNOSIS — Z7682 Awaiting organ transplant status: Secondary | ICD-10-CM | POA: Diagnosis not present

## 2022-04-25 DIAGNOSIS — Z992 Dependence on renal dialysis: Secondary | ICD-10-CM | POA: Diagnosis not present

## 2022-04-25 DIAGNOSIS — D631 Anemia in chronic kidney disease: Secondary | ICD-10-CM | POA: Diagnosis not present

## 2022-04-25 DIAGNOSIS — N186 End stage renal disease: Secondary | ICD-10-CM | POA: Diagnosis not present

## 2022-04-25 DIAGNOSIS — E875 Hyperkalemia: Secondary | ICD-10-CM | POA: Diagnosis not present

## 2022-04-25 DIAGNOSIS — D689 Coagulation defect, unspecified: Secondary | ICD-10-CM | POA: Diagnosis not present

## 2022-04-25 DIAGNOSIS — R197 Diarrhea, unspecified: Secondary | ICD-10-CM | POA: Diagnosis not present

## 2022-04-25 DIAGNOSIS — R519 Headache, unspecified: Secondary | ICD-10-CM | POA: Diagnosis not present

## 2022-04-26 ENCOUNTER — Ambulatory Visit: Payer: Medicare Other | Admitting: Neurology

## 2022-04-27 DIAGNOSIS — D689 Coagulation defect, unspecified: Secondary | ICD-10-CM | POA: Diagnosis not present

## 2022-04-27 DIAGNOSIS — N2581 Secondary hyperparathyroidism of renal origin: Secondary | ICD-10-CM | POA: Diagnosis not present

## 2022-04-27 DIAGNOSIS — Z992 Dependence on renal dialysis: Secondary | ICD-10-CM | POA: Diagnosis not present

## 2022-04-27 DIAGNOSIS — E875 Hyperkalemia: Secondary | ICD-10-CM | POA: Diagnosis not present

## 2022-04-27 DIAGNOSIS — N186 End stage renal disease: Secondary | ICD-10-CM | POA: Diagnosis not present

## 2022-04-27 DIAGNOSIS — Z7682 Awaiting organ transplant status: Secondary | ICD-10-CM | POA: Diagnosis not present

## 2022-04-27 DIAGNOSIS — R197 Diarrhea, unspecified: Secondary | ICD-10-CM | POA: Diagnosis not present

## 2022-04-27 DIAGNOSIS — D631 Anemia in chronic kidney disease: Secondary | ICD-10-CM | POA: Diagnosis not present

## 2022-04-27 DIAGNOSIS — R519 Headache, unspecified: Secondary | ICD-10-CM | POA: Diagnosis not present

## 2022-04-30 DIAGNOSIS — E875 Hyperkalemia: Secondary | ICD-10-CM | POA: Diagnosis not present

## 2022-04-30 DIAGNOSIS — D689 Coagulation defect, unspecified: Secondary | ICD-10-CM | POA: Diagnosis not present

## 2022-04-30 DIAGNOSIS — Z992 Dependence on renal dialysis: Secondary | ICD-10-CM | POA: Diagnosis not present

## 2022-04-30 DIAGNOSIS — N186 End stage renal disease: Secondary | ICD-10-CM | POA: Diagnosis not present

## 2022-04-30 DIAGNOSIS — D631 Anemia in chronic kidney disease: Secondary | ICD-10-CM | POA: Diagnosis not present

## 2022-04-30 DIAGNOSIS — N2581 Secondary hyperparathyroidism of renal origin: Secondary | ICD-10-CM | POA: Diagnosis not present

## 2022-04-30 DIAGNOSIS — Z7682 Awaiting organ transplant status: Secondary | ICD-10-CM | POA: Diagnosis not present

## 2022-04-30 DIAGNOSIS — R197 Diarrhea, unspecified: Secondary | ICD-10-CM | POA: Diagnosis not present

## 2022-04-30 DIAGNOSIS — R519 Headache, unspecified: Secondary | ICD-10-CM | POA: Diagnosis not present

## 2022-05-02 DIAGNOSIS — N2581 Secondary hyperparathyroidism of renal origin: Secondary | ICD-10-CM | POA: Diagnosis not present

## 2022-05-02 DIAGNOSIS — D689 Coagulation defect, unspecified: Secondary | ICD-10-CM | POA: Diagnosis not present

## 2022-05-02 DIAGNOSIS — D631 Anemia in chronic kidney disease: Secondary | ICD-10-CM | POA: Diagnosis not present

## 2022-05-02 DIAGNOSIS — R519 Headache, unspecified: Secondary | ICD-10-CM | POA: Diagnosis not present

## 2022-05-02 DIAGNOSIS — E875 Hyperkalemia: Secondary | ICD-10-CM | POA: Diagnosis not present

## 2022-05-02 DIAGNOSIS — Z7682 Awaiting organ transplant status: Secondary | ICD-10-CM | POA: Diagnosis not present

## 2022-05-02 DIAGNOSIS — R197 Diarrhea, unspecified: Secondary | ICD-10-CM | POA: Diagnosis not present

## 2022-05-02 DIAGNOSIS — N186 End stage renal disease: Secondary | ICD-10-CM | POA: Diagnosis not present

## 2022-05-02 DIAGNOSIS — Z992 Dependence on renal dialysis: Secondary | ICD-10-CM | POA: Diagnosis not present

## 2022-05-04 DIAGNOSIS — N186 End stage renal disease: Secondary | ICD-10-CM | POA: Diagnosis not present

## 2022-05-04 DIAGNOSIS — N2581 Secondary hyperparathyroidism of renal origin: Secondary | ICD-10-CM | POA: Diagnosis not present

## 2022-05-04 DIAGNOSIS — E875 Hyperkalemia: Secondary | ICD-10-CM | POA: Diagnosis not present

## 2022-05-04 DIAGNOSIS — D689 Coagulation defect, unspecified: Secondary | ICD-10-CM | POA: Diagnosis not present

## 2022-05-04 DIAGNOSIS — D631 Anemia in chronic kidney disease: Secondary | ICD-10-CM | POA: Diagnosis not present

## 2022-05-04 DIAGNOSIS — R519 Headache, unspecified: Secondary | ICD-10-CM | POA: Diagnosis not present

## 2022-05-04 DIAGNOSIS — Z7682 Awaiting organ transplant status: Secondary | ICD-10-CM | POA: Diagnosis not present

## 2022-05-04 DIAGNOSIS — Z992 Dependence on renal dialysis: Secondary | ICD-10-CM | POA: Diagnosis not present

## 2022-05-04 DIAGNOSIS — R197 Diarrhea, unspecified: Secondary | ICD-10-CM | POA: Diagnosis not present

## 2022-05-07 DIAGNOSIS — Z7682 Awaiting organ transplant status: Secondary | ICD-10-CM | POA: Diagnosis not present

## 2022-05-07 DIAGNOSIS — R519 Headache, unspecified: Secondary | ICD-10-CM | POA: Diagnosis not present

## 2022-05-07 DIAGNOSIS — D631 Anemia in chronic kidney disease: Secondary | ICD-10-CM | POA: Diagnosis not present

## 2022-05-07 DIAGNOSIS — D689 Coagulation defect, unspecified: Secondary | ICD-10-CM | POA: Diagnosis not present

## 2022-05-07 DIAGNOSIS — N2581 Secondary hyperparathyroidism of renal origin: Secondary | ICD-10-CM | POA: Diagnosis not present

## 2022-05-07 DIAGNOSIS — Z992 Dependence on renal dialysis: Secondary | ICD-10-CM | POA: Diagnosis not present

## 2022-05-07 DIAGNOSIS — E875 Hyperkalemia: Secondary | ICD-10-CM | POA: Diagnosis not present

## 2022-05-07 DIAGNOSIS — R197 Diarrhea, unspecified: Secondary | ICD-10-CM | POA: Diagnosis not present

## 2022-05-07 DIAGNOSIS — N186 End stage renal disease: Secondary | ICD-10-CM | POA: Diagnosis not present

## 2022-05-09 DIAGNOSIS — D631 Anemia in chronic kidney disease: Secondary | ICD-10-CM | POA: Diagnosis not present

## 2022-05-09 DIAGNOSIS — E875 Hyperkalemia: Secondary | ICD-10-CM | POA: Diagnosis not present

## 2022-05-09 DIAGNOSIS — R197 Diarrhea, unspecified: Secondary | ICD-10-CM | POA: Diagnosis not present

## 2022-05-09 DIAGNOSIS — D689 Coagulation defect, unspecified: Secondary | ICD-10-CM | POA: Diagnosis not present

## 2022-05-09 DIAGNOSIS — R519 Headache, unspecified: Secondary | ICD-10-CM | POA: Diagnosis not present

## 2022-05-09 DIAGNOSIS — Z992 Dependence on renal dialysis: Secondary | ICD-10-CM | POA: Diagnosis not present

## 2022-05-09 DIAGNOSIS — N186 End stage renal disease: Secondary | ICD-10-CM | POA: Diagnosis not present

## 2022-05-09 DIAGNOSIS — N2581 Secondary hyperparathyroidism of renal origin: Secondary | ICD-10-CM | POA: Diagnosis not present

## 2022-05-09 DIAGNOSIS — Z7682 Awaiting organ transplant status: Secondary | ICD-10-CM | POA: Diagnosis not present

## 2022-05-11 DIAGNOSIS — D689 Coagulation defect, unspecified: Secondary | ICD-10-CM | POA: Diagnosis not present

## 2022-05-11 DIAGNOSIS — N2581 Secondary hyperparathyroidism of renal origin: Secondary | ICD-10-CM | POA: Diagnosis not present

## 2022-05-11 DIAGNOSIS — Z992 Dependence on renal dialysis: Secondary | ICD-10-CM | POA: Diagnosis not present

## 2022-05-11 DIAGNOSIS — R197 Diarrhea, unspecified: Secondary | ICD-10-CM | POA: Diagnosis not present

## 2022-05-11 DIAGNOSIS — Z7682 Awaiting organ transplant status: Secondary | ICD-10-CM | POA: Diagnosis not present

## 2022-05-11 DIAGNOSIS — R519 Headache, unspecified: Secondary | ICD-10-CM | POA: Diagnosis not present

## 2022-05-11 DIAGNOSIS — N186 End stage renal disease: Secondary | ICD-10-CM | POA: Diagnosis not present

## 2022-05-11 DIAGNOSIS — D631 Anemia in chronic kidney disease: Secondary | ICD-10-CM | POA: Diagnosis not present

## 2022-05-11 DIAGNOSIS — E875 Hyperkalemia: Secondary | ICD-10-CM | POA: Diagnosis not present

## 2022-05-13 DIAGNOSIS — I12 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 stage renal disease: Secondary | ICD-10-CM | POA: Diagnosis not present

## 2022-05-13 DIAGNOSIS — Z992 Dependence on renal dialysis: Secondary | ICD-10-CM | POA: Diagnosis not present

## 2022-05-13 DIAGNOSIS — N186 End stage renal disease: Secondary | ICD-10-CM | POA: Diagnosis not present

## 2022-05-14 DIAGNOSIS — N186 End stage renal disease: Secondary | ICD-10-CM | POA: Diagnosis not present

## 2022-05-14 DIAGNOSIS — D631 Anemia in chronic kidney disease: Secondary | ICD-10-CM | POA: Diagnosis not present

## 2022-05-14 DIAGNOSIS — N2581 Secondary hyperparathyroidism of renal origin: Secondary | ICD-10-CM | POA: Diagnosis not present

## 2022-05-14 DIAGNOSIS — R197 Diarrhea, unspecified: Secondary | ICD-10-CM | POA: Diagnosis not present

## 2022-05-14 DIAGNOSIS — E875 Hyperkalemia: Secondary | ICD-10-CM | POA: Diagnosis not present

## 2022-05-14 DIAGNOSIS — Z992 Dependence on renal dialysis: Secondary | ICD-10-CM | POA: Diagnosis not present

## 2022-05-14 DIAGNOSIS — E1129 Type 2 diabetes mellitus with other diabetic kidney complication: Secondary | ICD-10-CM | POA: Diagnosis not present

## 2022-05-14 DIAGNOSIS — R519 Headache, unspecified: Secondary | ICD-10-CM | POA: Diagnosis not present

## 2022-05-14 DIAGNOSIS — D689 Coagulation defect, unspecified: Secondary | ICD-10-CM | POA: Diagnosis not present

## 2022-05-16 DIAGNOSIS — E875 Hyperkalemia: Secondary | ICD-10-CM | POA: Diagnosis not present

## 2022-05-16 DIAGNOSIS — R519 Headache, unspecified: Secondary | ICD-10-CM | POA: Diagnosis not present

## 2022-05-16 DIAGNOSIS — D689 Coagulation defect, unspecified: Secondary | ICD-10-CM | POA: Diagnosis not present

## 2022-05-16 DIAGNOSIS — D631 Anemia in chronic kidney disease: Secondary | ICD-10-CM | POA: Diagnosis not present

## 2022-05-16 DIAGNOSIS — R197 Diarrhea, unspecified: Secondary | ICD-10-CM | POA: Diagnosis not present

## 2022-05-16 DIAGNOSIS — N2581 Secondary hyperparathyroidism of renal origin: Secondary | ICD-10-CM | POA: Diagnosis not present

## 2022-05-16 DIAGNOSIS — E1129 Type 2 diabetes mellitus with other diabetic kidney complication: Secondary | ICD-10-CM | POA: Diagnosis not present

## 2022-05-16 DIAGNOSIS — Z992 Dependence on renal dialysis: Secondary | ICD-10-CM | POA: Diagnosis not present

## 2022-05-16 DIAGNOSIS — N186 End stage renal disease: Secondary | ICD-10-CM | POA: Diagnosis not present

## 2022-05-17 ENCOUNTER — Encounter: Payer: 59 | Admitting: Neurology

## 2022-05-18 DIAGNOSIS — N2581 Secondary hyperparathyroidism of renal origin: Secondary | ICD-10-CM | POA: Diagnosis not present

## 2022-05-18 DIAGNOSIS — R197 Diarrhea, unspecified: Secondary | ICD-10-CM | POA: Diagnosis not present

## 2022-05-18 DIAGNOSIS — E1129 Type 2 diabetes mellitus with other diabetic kidney complication: Secondary | ICD-10-CM | POA: Diagnosis not present

## 2022-05-18 DIAGNOSIS — R519 Headache, unspecified: Secondary | ICD-10-CM | POA: Diagnosis not present

## 2022-05-18 DIAGNOSIS — D689 Coagulation defect, unspecified: Secondary | ICD-10-CM | POA: Diagnosis not present

## 2022-05-18 DIAGNOSIS — Z992 Dependence on renal dialysis: Secondary | ICD-10-CM | POA: Diagnosis not present

## 2022-05-18 DIAGNOSIS — N186 End stage renal disease: Secondary | ICD-10-CM | POA: Diagnosis not present

## 2022-05-18 DIAGNOSIS — E875 Hyperkalemia: Secondary | ICD-10-CM | POA: Diagnosis not present

## 2022-05-18 DIAGNOSIS — D631 Anemia in chronic kidney disease: Secondary | ICD-10-CM | POA: Diagnosis not present

## 2022-05-21 DIAGNOSIS — D631 Anemia in chronic kidney disease: Secondary | ICD-10-CM | POA: Diagnosis not present

## 2022-05-21 DIAGNOSIS — Z992 Dependence on renal dialysis: Secondary | ICD-10-CM | POA: Diagnosis not present

## 2022-05-21 DIAGNOSIS — N186 End stage renal disease: Secondary | ICD-10-CM | POA: Diagnosis not present

## 2022-05-21 DIAGNOSIS — R519 Headache, unspecified: Secondary | ICD-10-CM | POA: Diagnosis not present

## 2022-05-21 DIAGNOSIS — E875 Hyperkalemia: Secondary | ICD-10-CM | POA: Diagnosis not present

## 2022-05-21 DIAGNOSIS — N2581 Secondary hyperparathyroidism of renal origin: Secondary | ICD-10-CM | POA: Diagnosis not present

## 2022-05-21 DIAGNOSIS — R197 Diarrhea, unspecified: Secondary | ICD-10-CM | POA: Diagnosis not present

## 2022-05-21 DIAGNOSIS — D689 Coagulation defect, unspecified: Secondary | ICD-10-CM | POA: Diagnosis not present

## 2022-05-21 DIAGNOSIS — E1129 Type 2 diabetes mellitus with other diabetic kidney complication: Secondary | ICD-10-CM | POA: Diagnosis not present

## 2022-05-23 DIAGNOSIS — R197 Diarrhea, unspecified: Secondary | ICD-10-CM | POA: Diagnosis not present

## 2022-05-23 DIAGNOSIS — D631 Anemia in chronic kidney disease: Secondary | ICD-10-CM | POA: Diagnosis not present

## 2022-05-23 DIAGNOSIS — D689 Coagulation defect, unspecified: Secondary | ICD-10-CM | POA: Diagnosis not present

## 2022-05-23 DIAGNOSIS — R519 Headache, unspecified: Secondary | ICD-10-CM | POA: Diagnosis not present

## 2022-05-23 DIAGNOSIS — N2581 Secondary hyperparathyroidism of renal origin: Secondary | ICD-10-CM | POA: Diagnosis not present

## 2022-05-23 DIAGNOSIS — Z992 Dependence on renal dialysis: Secondary | ICD-10-CM | POA: Diagnosis not present

## 2022-05-23 DIAGNOSIS — E1129 Type 2 diabetes mellitus with other diabetic kidney complication: Secondary | ICD-10-CM | POA: Diagnosis not present

## 2022-05-23 DIAGNOSIS — N186 End stage renal disease: Secondary | ICD-10-CM | POA: Diagnosis not present

## 2022-05-23 DIAGNOSIS — E875 Hyperkalemia: Secondary | ICD-10-CM | POA: Diagnosis not present

## 2022-05-25 DIAGNOSIS — D689 Coagulation defect, unspecified: Secondary | ICD-10-CM | POA: Diagnosis not present

## 2022-05-25 DIAGNOSIS — N2581 Secondary hyperparathyroidism of renal origin: Secondary | ICD-10-CM | POA: Diagnosis not present

## 2022-05-25 DIAGNOSIS — R197 Diarrhea, unspecified: Secondary | ICD-10-CM | POA: Diagnosis not present

## 2022-05-25 DIAGNOSIS — E875 Hyperkalemia: Secondary | ICD-10-CM | POA: Diagnosis not present

## 2022-05-25 DIAGNOSIS — Z992 Dependence on renal dialysis: Secondary | ICD-10-CM | POA: Diagnosis not present

## 2022-05-25 DIAGNOSIS — R519 Headache, unspecified: Secondary | ICD-10-CM | POA: Diagnosis not present

## 2022-05-25 DIAGNOSIS — N186 End stage renal disease: Secondary | ICD-10-CM | POA: Diagnosis not present

## 2022-05-25 DIAGNOSIS — E1129 Type 2 diabetes mellitus with other diabetic kidney complication: Secondary | ICD-10-CM | POA: Diagnosis not present

## 2022-05-25 DIAGNOSIS — D631 Anemia in chronic kidney disease: Secondary | ICD-10-CM | POA: Diagnosis not present

## 2022-05-28 DIAGNOSIS — E1129 Type 2 diabetes mellitus with other diabetic kidney complication: Secondary | ICD-10-CM | POA: Diagnosis not present

## 2022-05-28 DIAGNOSIS — N186 End stage renal disease: Secondary | ICD-10-CM | POA: Diagnosis not present

## 2022-05-28 DIAGNOSIS — R197 Diarrhea, unspecified: Secondary | ICD-10-CM | POA: Diagnosis not present

## 2022-05-28 DIAGNOSIS — N2581 Secondary hyperparathyroidism of renal origin: Secondary | ICD-10-CM | POA: Diagnosis not present

## 2022-05-28 DIAGNOSIS — Z992 Dependence on renal dialysis: Secondary | ICD-10-CM | POA: Diagnosis not present

## 2022-05-28 DIAGNOSIS — D689 Coagulation defect, unspecified: Secondary | ICD-10-CM | POA: Diagnosis not present

## 2022-05-28 DIAGNOSIS — D631 Anemia in chronic kidney disease: Secondary | ICD-10-CM | POA: Diagnosis not present

## 2022-05-28 DIAGNOSIS — E875 Hyperkalemia: Secondary | ICD-10-CM | POA: Diagnosis not present

## 2022-05-28 DIAGNOSIS — R519 Headache, unspecified: Secondary | ICD-10-CM | POA: Diagnosis not present

## 2022-05-30 DIAGNOSIS — N2581 Secondary hyperparathyroidism of renal origin: Secondary | ICD-10-CM | POA: Diagnosis not present

## 2022-05-30 DIAGNOSIS — E875 Hyperkalemia: Secondary | ICD-10-CM | POA: Diagnosis not present

## 2022-05-30 DIAGNOSIS — R519 Headache, unspecified: Secondary | ICD-10-CM | POA: Diagnosis not present

## 2022-05-30 DIAGNOSIS — N186 End stage renal disease: Secondary | ICD-10-CM | POA: Diagnosis not present

## 2022-05-30 DIAGNOSIS — R197 Diarrhea, unspecified: Secondary | ICD-10-CM | POA: Diagnosis not present

## 2022-05-30 DIAGNOSIS — D631 Anemia in chronic kidney disease: Secondary | ICD-10-CM | POA: Diagnosis not present

## 2022-05-30 DIAGNOSIS — D689 Coagulation defect, unspecified: Secondary | ICD-10-CM | POA: Diagnosis not present

## 2022-05-30 DIAGNOSIS — E1129 Type 2 diabetes mellitus with other diabetic kidney complication: Secondary | ICD-10-CM | POA: Diagnosis not present

## 2022-05-30 DIAGNOSIS — Z992 Dependence on renal dialysis: Secondary | ICD-10-CM | POA: Diagnosis not present

## 2022-06-01 DIAGNOSIS — N186 End stage renal disease: Secondary | ICD-10-CM | POA: Diagnosis not present

## 2022-06-01 DIAGNOSIS — R197 Diarrhea, unspecified: Secondary | ICD-10-CM | POA: Diagnosis not present

## 2022-06-01 DIAGNOSIS — R519 Headache, unspecified: Secondary | ICD-10-CM | POA: Diagnosis not present

## 2022-06-01 DIAGNOSIS — D631 Anemia in chronic kidney disease: Secondary | ICD-10-CM | POA: Diagnosis not present

## 2022-06-01 DIAGNOSIS — Z992 Dependence on renal dialysis: Secondary | ICD-10-CM | POA: Diagnosis not present

## 2022-06-01 DIAGNOSIS — E875 Hyperkalemia: Secondary | ICD-10-CM | POA: Diagnosis not present

## 2022-06-01 DIAGNOSIS — N2581 Secondary hyperparathyroidism of renal origin: Secondary | ICD-10-CM | POA: Diagnosis not present

## 2022-06-01 DIAGNOSIS — E1129 Type 2 diabetes mellitus with other diabetic kidney complication: Secondary | ICD-10-CM | POA: Diagnosis not present

## 2022-06-01 DIAGNOSIS — D689 Coagulation defect, unspecified: Secondary | ICD-10-CM | POA: Diagnosis not present

## 2022-06-04 DIAGNOSIS — R519 Headache, unspecified: Secondary | ICD-10-CM | POA: Diagnosis not present

## 2022-06-04 DIAGNOSIS — N186 End stage renal disease: Secondary | ICD-10-CM | POA: Diagnosis not present

## 2022-06-04 DIAGNOSIS — D689 Coagulation defect, unspecified: Secondary | ICD-10-CM | POA: Diagnosis not present

## 2022-06-04 DIAGNOSIS — D631 Anemia in chronic kidney disease: Secondary | ICD-10-CM | POA: Diagnosis not present

## 2022-06-04 DIAGNOSIS — Z992 Dependence on renal dialysis: Secondary | ICD-10-CM | POA: Diagnosis not present

## 2022-06-04 DIAGNOSIS — N2581 Secondary hyperparathyroidism of renal origin: Secondary | ICD-10-CM | POA: Diagnosis not present

## 2022-06-04 DIAGNOSIS — E1129 Type 2 diabetes mellitus with other diabetic kidney complication: Secondary | ICD-10-CM | POA: Diagnosis not present

## 2022-06-04 DIAGNOSIS — R197 Diarrhea, unspecified: Secondary | ICD-10-CM | POA: Diagnosis not present

## 2022-06-04 DIAGNOSIS — E875 Hyperkalemia: Secondary | ICD-10-CM | POA: Diagnosis not present

## 2022-06-06 DIAGNOSIS — N2581 Secondary hyperparathyroidism of renal origin: Secondary | ICD-10-CM | POA: Diagnosis not present

## 2022-06-06 DIAGNOSIS — R519 Headache, unspecified: Secondary | ICD-10-CM | POA: Diagnosis not present

## 2022-06-06 DIAGNOSIS — E875 Hyperkalemia: Secondary | ICD-10-CM | POA: Diagnosis not present

## 2022-06-06 DIAGNOSIS — Z992 Dependence on renal dialysis: Secondary | ICD-10-CM | POA: Diagnosis not present

## 2022-06-06 DIAGNOSIS — D631 Anemia in chronic kidney disease: Secondary | ICD-10-CM | POA: Diagnosis not present

## 2022-06-06 DIAGNOSIS — E1129 Type 2 diabetes mellitus with other diabetic kidney complication: Secondary | ICD-10-CM | POA: Diagnosis not present

## 2022-06-06 DIAGNOSIS — R197 Diarrhea, unspecified: Secondary | ICD-10-CM | POA: Diagnosis not present

## 2022-06-06 DIAGNOSIS — N186 End stage renal disease: Secondary | ICD-10-CM | POA: Diagnosis not present

## 2022-06-06 DIAGNOSIS — D689 Coagulation defect, unspecified: Secondary | ICD-10-CM | POA: Diagnosis not present

## 2022-06-08 DIAGNOSIS — E875 Hyperkalemia: Secondary | ICD-10-CM | POA: Diagnosis not present

## 2022-06-08 DIAGNOSIS — N186 End stage renal disease: Secondary | ICD-10-CM | POA: Diagnosis not present

## 2022-06-08 DIAGNOSIS — R519 Headache, unspecified: Secondary | ICD-10-CM | POA: Diagnosis not present

## 2022-06-08 DIAGNOSIS — R197 Diarrhea, unspecified: Secondary | ICD-10-CM | POA: Diagnosis not present

## 2022-06-08 DIAGNOSIS — N2581 Secondary hyperparathyroidism of renal origin: Secondary | ICD-10-CM | POA: Diagnosis not present

## 2022-06-08 DIAGNOSIS — E1129 Type 2 diabetes mellitus with other diabetic kidney complication: Secondary | ICD-10-CM | POA: Diagnosis not present

## 2022-06-08 DIAGNOSIS — Z992 Dependence on renal dialysis: Secondary | ICD-10-CM | POA: Diagnosis not present

## 2022-06-08 DIAGNOSIS — D631 Anemia in chronic kidney disease: Secondary | ICD-10-CM | POA: Diagnosis not present

## 2022-06-08 DIAGNOSIS — D689 Coagulation defect, unspecified: Secondary | ICD-10-CM | POA: Diagnosis not present

## 2022-06-11 DIAGNOSIS — N186 End stage renal disease: Secondary | ICD-10-CM | POA: Diagnosis not present

## 2022-06-11 DIAGNOSIS — R519 Headache, unspecified: Secondary | ICD-10-CM | POA: Diagnosis not present

## 2022-06-11 DIAGNOSIS — D689 Coagulation defect, unspecified: Secondary | ICD-10-CM | POA: Diagnosis not present

## 2022-06-11 DIAGNOSIS — Z992 Dependence on renal dialysis: Secondary | ICD-10-CM | POA: Diagnosis not present

## 2022-06-11 DIAGNOSIS — D631 Anemia in chronic kidney disease: Secondary | ICD-10-CM | POA: Diagnosis not present

## 2022-06-11 DIAGNOSIS — N2581 Secondary hyperparathyroidism of renal origin: Secondary | ICD-10-CM | POA: Diagnosis not present

## 2022-06-11 DIAGNOSIS — R197 Diarrhea, unspecified: Secondary | ICD-10-CM | POA: Diagnosis not present

## 2022-06-11 DIAGNOSIS — E875 Hyperkalemia: Secondary | ICD-10-CM | POA: Diagnosis not present

## 2022-06-11 DIAGNOSIS — E1129 Type 2 diabetes mellitus with other diabetic kidney complication: Secondary | ICD-10-CM | POA: Diagnosis not present

## 2022-06-12 DIAGNOSIS — N186 End stage renal disease: Secondary | ICD-10-CM | POA: Diagnosis not present

## 2022-06-12 DIAGNOSIS — I12 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 stage renal disease: Secondary | ICD-10-CM | POA: Diagnosis not present

## 2022-06-12 DIAGNOSIS — Z992 Dependence on renal dialysis: Secondary | ICD-10-CM | POA: Diagnosis not present

## 2022-06-13 DIAGNOSIS — D689 Coagulation defect, unspecified: Secondary | ICD-10-CM | POA: Diagnosis not present

## 2022-06-13 DIAGNOSIS — N2581 Secondary hyperparathyroidism of renal origin: Secondary | ICD-10-CM | POA: Diagnosis not present

## 2022-06-13 DIAGNOSIS — E875 Hyperkalemia: Secondary | ICD-10-CM | POA: Diagnosis not present

## 2022-06-13 DIAGNOSIS — R519 Headache, unspecified: Secondary | ICD-10-CM | POA: Diagnosis not present

## 2022-06-13 DIAGNOSIS — Z992 Dependence on renal dialysis: Secondary | ICD-10-CM | POA: Diagnosis not present

## 2022-06-13 DIAGNOSIS — N186 End stage renal disease: Secondary | ICD-10-CM | POA: Diagnosis not present

## 2022-06-13 DIAGNOSIS — D631 Anemia in chronic kidney disease: Secondary | ICD-10-CM | POA: Diagnosis not present

## 2022-06-15 DIAGNOSIS — D689 Coagulation defect, unspecified: Secondary | ICD-10-CM | POA: Diagnosis not present

## 2022-06-15 DIAGNOSIS — N2581 Secondary hyperparathyroidism of renal origin: Secondary | ICD-10-CM | POA: Diagnosis not present

## 2022-06-15 DIAGNOSIS — D631 Anemia in chronic kidney disease: Secondary | ICD-10-CM | POA: Diagnosis not present

## 2022-06-15 DIAGNOSIS — Z992 Dependence on renal dialysis: Secondary | ICD-10-CM | POA: Diagnosis not present

## 2022-06-15 DIAGNOSIS — N186 End stage renal disease: Secondary | ICD-10-CM | POA: Diagnosis not present

## 2022-06-15 DIAGNOSIS — E875 Hyperkalemia: Secondary | ICD-10-CM | POA: Diagnosis not present

## 2022-06-15 DIAGNOSIS — R519 Headache, unspecified: Secondary | ICD-10-CM | POA: Diagnosis not present

## 2022-06-18 DIAGNOSIS — R519 Headache, unspecified: Secondary | ICD-10-CM | POA: Diagnosis not present

## 2022-06-18 DIAGNOSIS — D689 Coagulation defect, unspecified: Secondary | ICD-10-CM | POA: Diagnosis not present

## 2022-06-18 DIAGNOSIS — E875 Hyperkalemia: Secondary | ICD-10-CM | POA: Diagnosis not present

## 2022-06-18 DIAGNOSIS — D631 Anemia in chronic kidney disease: Secondary | ICD-10-CM | POA: Diagnosis not present

## 2022-06-18 DIAGNOSIS — Z992 Dependence on renal dialysis: Secondary | ICD-10-CM | POA: Diagnosis not present

## 2022-06-18 DIAGNOSIS — N186 End stage renal disease: Secondary | ICD-10-CM | POA: Diagnosis not present

## 2022-06-18 DIAGNOSIS — N2581 Secondary hyperparathyroidism of renal origin: Secondary | ICD-10-CM | POA: Diagnosis not present

## 2022-06-19 ENCOUNTER — Ambulatory Visit (INDEPENDENT_AMBULATORY_CARE_PROVIDER_SITE_OTHER): Payer: 59 | Admitting: Neurology

## 2022-06-19 DIAGNOSIS — G5603 Carpal tunnel syndrome, bilateral upper limbs: Secondary | ICD-10-CM

## 2022-06-19 DIAGNOSIS — G629 Polyneuropathy, unspecified: Secondary | ICD-10-CM

## 2022-06-19 DIAGNOSIS — R200 Anesthesia of skin: Secondary | ICD-10-CM

## 2022-06-19 DIAGNOSIS — R202 Paresthesia of skin: Secondary | ICD-10-CM | POA: Diagnosis not present

## 2022-06-19 DIAGNOSIS — M1990 Unspecified osteoarthritis, unspecified site: Secondary | ICD-10-CM

## 2022-06-19 DIAGNOSIS — G5620 Lesion of ulnar nerve, unspecified upper limb: Secondary | ICD-10-CM

## 2022-06-19 DIAGNOSIS — R29898 Other symptoms and signs involving the musculoskeletal system: Secondary | ICD-10-CM

## 2022-06-19 NOTE — Procedures (Signed)
[facility] Neurology  [address], [address]  [place], [place] [address] Tel: [phone] Fax: [phone] Test Date:  06/19/2022  Patient: [name] DOB: April 26, 1960 Physician: Jacquelyne Balint, MD  Sex: Male Height: 6\' 1"  Ref Phys: Jacquelyne Balint, MD  ID#: [id number]   Technician:    History: This is a 62 year old male with numbness and tingling in hands and legs.  NCV & EMG Findings: Extensive electrodiagnostic evaluation of the right and left upper limb shows: Bilateral median and bilateral ulnar sensory responses are absent. Bilateral radial sensory responses show reduced amplitudes (right 5, left 8 V). Left median (APB) motor response shows prolonged distal onset latency (6.8 ms) and reduced amplitude (1.19 mV). Right median (APB) motor response shows prolonged distal onset latency (4.7 ms) and decreased conduction velocity (42 m/s). Left ulnar (ADM) motor response shows decreased conduction velocity (45 m/s). Right ulnar (ADM) motor response shows reduced amplitude (4.8 mV) and decreased conduction velocity across the elbow (40 m/s). Chronic motor axon loss changes without accompanying active denervation changes are seen in bilateral first dorsal interosseous, bilateral abductor pollicis brevis, bilateral flexor digitorum profundus to digits 4,5, and right abductor digiti minimi.  Impression: This is a complex, abnormal study. The right upper limb is complicated by extensive dialysis fistula that presents technical challenges. With those caveats noted, the findings are most consistent with the following: Upper limb manifestations of a peripheral neuropathy, predominantly axon loss in type, likely severe in degree electrically. Bilateral median mononeuropathy at or distal to the wrist, consistent with carpal tunnel syndrome, severe in degree bilaterally, but left appears worse than right. Bilateral ulnar mononeuropathy proximal to the take off to the flexor digitorum profundus to  digits 4,5, likely at the elbow given the slowing of conduction velocity in that area. Findings are difficult to grade given #1 above. No definite electrodiagnostic evidence of a right or left cervical (C5-T1) motor radiculopathy.    ___________________________ Jacquelyne Balint, MD    Nerve Conduction Studies Motor Nerve Results    Latency Amplitude F-Lat Segment Distance CV Comment  Site (ms) Norm (mV) Norm (ms)  (cm) (m/s) Norm   Left Median (APB) Motor  Wrist *6.8  < 4.0 *1.19  > 5.0        Elbow 14.8 - 0.69 -  Elbow-Wrist 40 50  > 50   Right Median (APB) Motor  Wrist *4.7  < 4.0 5.2  > 5.0        Elbow 13.3 - 3.4 -  Elbow-Wrist 36 *42  > 50   Left Ulnar (ADM) Motor  Wrist 2.5  < 3.1 7.4  > 7.0        Bel elbow 8.9 - 5.7 -  Bel elbow-Wrist 28.5 *45  > 50   Ab elbow 11.2 - 5.3 -  Ab elbow-Bel elbow 10 43 -   Right Ulnar (ADM) Motor  Wrist 3.0  < 3.1 *4.8  > 7.0        Bel elbow 9.7 - 3.9 -  Bel elbow-Wrist 30 *45  > 50   Ab elbow 12.2 - 3.9 -  Ab elbow-Bel elbow 10 40 -    Sensory Sites    Neg Peak Lat Amplitude (O-P) Segment Distance Velocity Comment  Site (ms) Norm (V) Norm  (cm) (ms)   Left Median Sensory  Wrist-Dig II *NR  < 3.8 *NR  > 10 Wrist-Dig II 13    Right Median Sensory  Wrist-Dig II *NR  < 3.8 *NR  >  10 Wrist-Dig II 13    Left Radial Sensory  Forearm-Wrist 2.7  < 2.8 *8  > 10 Forearm-Wrist 10    Right Radial Sensory  Forearm-Wrist 2.3  < 2.8 *5  > 10 Forearm-Wrist 10    Left Ulnar Sensory  Wrist-Dig V *NR  < 3.2 *NR  > 5 Wrist-Dig V 11    Right Ulnar Sensory  Wrist-Dig V *NR  < 3.2 *NR  > 5 Wrist-Dig V 12     Electromyography   Side Muscle Ins.Act Fibs Fasc Recrt Amp Dur Poly Activation Comment  Left FDI Nml Nml Nml *3- *1+ *1+ *1+ Nml N/A  Left EIP Nml Nml Nml Nml Nml Nml Nml Nml N/A  Left APB Nml Nml Nml *2- *1+ *1+ *1+ Nml N/A  Left Pronator teres Nml Nml Nml Nml Nml Nml Nml Nml N/A  Left FDP Nml Nml Nml *2- *1+ *1+ *1+ Nml N/A  Left Biceps Nml Nml  Nml Nml Nml Nml Nml Nml N/A  Left Triceps Nml Nml Nml Nml Nml Nml Nml Nml N/A  Left Deltoid Nml Nml Nml Nml Nml Nml Nml Nml N/A  Right FDI Nml Nml Nml *SMU *1+ *2+ *2+ Nml N/A  Right ADM Nml Nml Nml *3- *1+ *2+ *1+ Nml N/A  Right FDP Nml Nml Nml *1- *1+ *1+ *1+ Nml N/A  Right Deltoid Nml Nml Nml Nml Nml Nml Nml Nml N/A  Right APB Nml Nml Nml *2- *1+ *1+ *1+ Nml N/A      Waveforms:  Motor           Sensory

## 2022-06-20 DIAGNOSIS — D689 Coagulation defect, unspecified: Secondary | ICD-10-CM | POA: Diagnosis not present

## 2022-06-20 DIAGNOSIS — N186 End stage renal disease: Secondary | ICD-10-CM | POA: Diagnosis not present

## 2022-06-20 DIAGNOSIS — E875 Hyperkalemia: Secondary | ICD-10-CM | POA: Diagnosis not present

## 2022-06-20 DIAGNOSIS — R519 Headache, unspecified: Secondary | ICD-10-CM | POA: Diagnosis not present

## 2022-06-20 DIAGNOSIS — D631 Anemia in chronic kidney disease: Secondary | ICD-10-CM | POA: Diagnosis not present

## 2022-06-20 DIAGNOSIS — Z992 Dependence on renal dialysis: Secondary | ICD-10-CM | POA: Diagnosis not present

## 2022-06-20 DIAGNOSIS — N2581 Secondary hyperparathyroidism of renal origin: Secondary | ICD-10-CM | POA: Diagnosis not present

## 2022-06-21 NOTE — Progress Notes (Deleted)
I saw Carl Canela Sr. in neurology clinic on 06/28/22 in follow up for numbness and tingling in legs and hands.  HPI: Carl Captain Sr. is a 62 y.o. year old male with a history of HLD, iron deficiency anemia, ESRD on dialysis, duodenal ulcer disease, OA s/p left total hip replacement who we last saw on 01/16/22.  To briefly review: Patient has had numbness and tingling in hands for the last couple of months. It is more in the left hand than the right. It occurs mostly at night. It is a pins and needles and burning sensation. It feels like it starts in the thumb area but involves the whole hand. He has occasional neck pain, but this is not prominent. He denies any weakness in his hands.   He has occasional numbness and tingling in his feet as well.   He takes gabapentin 100 mg at night. He takes it most nights, but not every night. He takes B12 1000 mcg daily.   EtOH use: Very rare  Restrictive diet? No Family history of neuropathy/myopathy/NM disease? No  Most recent Assessment and Plan (01/16/22): His neurological examination is pertinent for atrophy of right > left intrinsic hand muscles and sensory changes in right foot. Available diagnostic data is significant for normal B12 and A1c. The etiology of patient's symptoms is currently unclear. His RUE may be due to radiculopathy (C8-T1) vs carpal tunnel and ulnar mononeuropathy. The LUE is similar but less severe. The sensory asymmetry in the lower extremities is also unusual and without clear explanation. I will work up as below.   PLAN: -Blood work: CBC, IFE -EMG -Increase gabapentin to 200 mg qhs  Since their last visit: Blood work showed improved Hb (12.0 vs 5.7 previously). IFE showed no M protein. 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 Testing was complicated by fistula on  right that limited studies that could be performed.  ***  ROS: Pertinent positive and negative systems reviewed in HPI. ***   MEDICATIONS:  Outpatient Encounter Medications as of 06/28/2022  Medication Sig   acetaminophen (TYLENOL) 650 MG CR tablet Take 1,300 mg by mouth every 8 (eight) hours as needed for pain.   amoxicillin (AMOXIL) 500 MG capsule Take 4 pills one hour prior to dental work   atorvastatin (LIPITOR) 10 MG tablet Take 10 mg by mouth 2 (two) times a week.   augmented betamethasone dipropionate (DIPROLENE-AF) 0.05 % ointment Apply 1 Application topically 2 (two) times daily as needed for rash.   cinacalcet (SENSIPAR) 60 MG tablet Take 120 mg by mouth daily with supper.   cromolyn (OPTICROM) 4 % ophthalmic solution Place 1 drop into both eyes daily as needed (redness).   ferrous sulfate 325 (65 FE) MG tablet Take 1 tablet once daily   folic acid (FOLVITE) 1 MG tablet Take 1 tablet (1 mg total) by mouth daily.   gabapentin (NEURONTIN) 100 MG capsule Take 2 capsules (200 mg total) by mouth at bedtime.   LOKELMA 10 g PACK packet Take 1 packet by mouth daily.   multivitamin (RENA-VIT) TABS tablet Take 1 tablet by mouth daily.   pantoprazole (PROTONIX) 40 MG tablet Take 1 tablet (40 mg total) by mouth 2 (two) times daily before a meal.   sevelamer carbonate (RENVELA) 800 MG tablet Take 4,000 mg by mouth 3 (three) times daily with meals. 5 tabs with meals  sucralfate (CARAFATE) 1 GM/10ML suspension Take 10 mLs (1 g total) by mouth 2 (two) times daily.   vitamin B-12 (CYANOCOBALAMIN) 1000 MCG tablet Take 1,000 mcg by mouth daily.   vitamin E 180 MG (400 UNITS) capsule Take 400 Units by mouth 3 (three) times a week.   No facility-administered encounter medications on file as of 06/28/2022.    PAST MEDICAL HISTORY: Past Medical History:  Diagnosis Date   Acute gastric ulcer with hemorrhage    Acute GI bleeding 07/31/2019   Acute pericarditis    Anemia    hx of   Anxiety     situational    Arthritis    on meds   Borderline diabetes    Cataract    bilateral sx   Chronic headaches    Depression    situational    Diverticulitis    ESRD (end stage renal disease) (HCC)     On Renal Transplant List," [facility]; MWF" (10/23/2016)   GERD (gastroesophageal reflux disease)    with certain foods   GI bleed    Hypertension    diet controlled   Parathyroid abnormality (HCC)    ectopic parathyroid gland   Presence of arteriovenous fistula for hemodialysis, primary (HCC)    RUE PER PT RLE   Refusal of blood product    NO WHOLE BLOOD PROUCTS   Renal cell carcinoma (HCC)    s/p hand assisted laparoscopic bilateral nephrectomies 11/29/17, + RCC left   Secondary hyperparathyroidism (HCC)    Seizures (HCC)    one episode in past, due to" elevated Potassium" 08/02/20- "at least 4 years ago"   Sleep apnea    doesn't use CPAP anymore since weight loss   Stroke (HCC)    no residual    PAST SURGICAL HISTORY: Past Surgical History:  Procedure Laterality Date   AV FISTULA PLACEMENT Right    right arm   BIOPSY  08/01/2019   Procedure: BIOPSY;  Surgeon: Charna Elizabeth, MD;  Location: [facility] ENDOSCOPY;  Service: Endoscopy;;   BIOPSY  11/18/2020   Procedure: BIOPSY;  Surgeon: Jenel Lucks, MD;  Location: [facility] ENDOSCOPY;  Service: Gastroenterology;;   BIOPSY  09/13/2021   Procedure: BIOPSY;  Surgeon: Lemar Lofty., MD;  Location: [facility] ENDOSCOPY;  Service: Gastroenterology;;   CATARACT EXTRACTION W/ INTRAOCULAR LENS  IMPLANT, BILATERAL     COLON SURGERY     COLONOSCOPY N/A 08/04/2015   Procedure: COLONOSCOPY;  Surgeon: Beverley Fiedler, MD;  Location: [facility] ENDOSCOPY;  Service: Endoscopy;  Laterality: N/A;   COLONOSCOPY  2017   JMP@ [facility]-good prep-mass -recall 1 yr   COLONOSCOPY N/A 09/13/2021   Procedure: COLONOSCOPY;  Surgeon: Mansouraty, Netty Starring., MD;  Location: [facility] ENDOSCOPY;  Service: Gastroenterology;  Laterality: N/A;   COLONOSCOPY WITH PROPOFOL N/A 11/25/2020    Procedure: COLONOSCOPY WITH PROPOFOL;  Surgeon: Imogene Burn, MD;  Location[facility] ENDOSCOPY;  Service: Gastroenterology;  Laterality: N/A;   COLONOSCOPY WITH PROPOFOL N/A 09/23/2021   Procedure: COLONOSCOPY WITH PROPOFOL;  Surgeon: Shellia Cleverly, DO;  Location: [facility] ENDOSCOPY;  Service: Gastroenterology;  Laterality: N/A;   ENTEROSCOPY N/A 09/23/2021   Procedure: ENTEROSCOPY;  Surgeon: Shellia Cleverly, DO;  Location: [facility] ENDOSCOPY;  Service: Gastroenterology;  Laterality: N/A;  Will place order for video capsule study as we may opt to place it during procedure   ESOPHAGOGASTRODUODENOSCOPY N/A 08/01/2019   Procedure: ESOPHAGOGASTRODUODENOSCOPY (EGD);  Surgeon: Charna Elizabeth, MD;  Location: [facility] ENDOSCOPY;  Service: Endoscopy;  Laterality: N/A;   ESOPHAGOGASTRODUODENOSCOPY  N/A 11/18/2020   Procedure: ESOPHAGOGASTRODUODENOSCOPY (EGD);  Surgeon: Jenel Lucks, MD;  Location: [facility] ENDOSCOPY;  Service: Gastroenterology;  Laterality: N/A;   ESOPHAGOGASTRODUODENOSCOPY (EGD) WITH PROPOFOL N/A 08/04/2019   Procedure: ESOPHAGOGASTRODUODENOSCOPY (EGD) WITH PROPOFOL;  Surgeon: Benancio Deeds, MD;  Location: [facility] ENDOSCOPY;  Service: Gastroenterology;  Laterality: N/A;   ESOPHAGOGASTRODUODENOSCOPY (EGD) WITH PROPOFOL N/A 09/13/2021   Procedure: ESOPHAGOGASTRODUODENOSCOPY (EGD) WITH PROPOFOL;  Surgeon: Meridee Score Netty Starring., MD;  Location: [facility] ENDOSCOPY;  Service: Gastroenterology;  Laterality: N/A;   GIVENS CAPSULE STUDY N/A 09/23/2021   Procedure: GIVENS CAPSULE STUDY;  Surgeon: Shellia Cleverly, DO;  Location: [facility] ENDOSCOPY;  Service: Gastroenterology;  Laterality: N/A;   graft left arm Left    for dialysis x 2. Removed   HOT HEMOSTASIS  11/18/2020   Procedure: HOT HEMOSTASIS (ARGON PLASMA COAGULATION/BICAP);  Surgeon: Jenel Lucks, MD;  Location: [facility] ENDOSCOPY;  Service: Gastroenterology;;   HOT HEMOSTASIS N/A 09/23/2021   Procedure: HOT HEMOSTASIS (ARGON PLASMA COAGULATION/BICAP);  Surgeon:  Shellia Cleverly, DO;  Location: [facility] ENDOSCOPY;  Service: Gastroenterology;  Laterality: N/A;   INSERTION OF DIALYSIS CATHETER     Rt chest   LAPAROSCOPIC RIGHT COLECTOMY N/A 08/05/2015   Procedure: LAPAROSCOPIC RIGHT COLECTOMY- ASCENDING;  Surgeon: Almond Lint, MD;  Location: [facility] OR;  Service: General;  Laterality: N/A;   MASS EXCISION Left 05/28/2019   Procedure: EXCISION SOFT TISSUE MASS LEFT SHOULDER;  Surgeon: Darnell Level, MD;  Location: [facility] ORS;  Service: General;  Laterality: Left;   NEPHRECTOMY Bilateral    PARATHYROIDECTOMY N/A 06/12/2016   Procedure: TOTAL PARATHYROIDECTOMY WITH AUTOTRANSPLANTATION TO LEFT FOREARM;  Surgeon: Darnell Level, MD;  Location: [facility] OR;  Service: General;  Laterality: N/A;   PARATHYROIDECTOMY N/A 10/23/2016   Procedure: PARATHYROIDECTOMY;  Surgeon: Darnell Level, MD;  Location: [facility] OR;  Service: General;  Laterality: N/A;   REVERSE SHOULDER ARTHROPLASTY Right 08/24/2020   Procedure: REVERSE SHOULDER ARTHROPLASTY;  Surgeon: Bjorn Pippin, MD;  Location: [facility] OR;  Service: Orthopedics;  Laterality: Right;   REVISON OF ARTERIOVENOUS FISTULA Right 07/16/2017   Procedure: REVISION OF ARTERIOVENOUS FISTULA  Right ARM;  Surgeon: Maeola Harman, MD;  Location: [facility] OR;  Service: Vascular;  Laterality: Right;   TOTAL HIP ARTHROPLASTY Left 11/14/2020   Procedure: LEFT TOTAL HIP ARTHROPLASTY ANTERIOR APPROACH;  Surgeon: Tarry Kos, MD;  Location: [facility] OR;  Service: Orthopedics;  Laterality: Left;   UPPER GASTROINTESTINAL ENDOSCOPY  2021   @ [facility]    ALLERGIES: Allergies  Allergen Reactions   Infed [Iron Dextran] Other (See Comments)    Decreased BP    Oxycodone Nausea Only   Vicodin [Hydrocodone-Acetaminophen] Other (See Comments)    Decreased BP   Diclofenac Other (See Comments)    Unknown    FAMILY HISTORY: Family History  Problem Relation Age of Onset   Diabetes Father    Stroke Father    Hypertension Father    Uterine cancer Mother    Lupus  Sister    Stroke Sister    Hypertension Sister    Anuerysm Brother        brain   Colon cancer Neg Hx    Esophageal cancer Neg Hx    Stomach cancer Neg Hx    Pancreatic cancer Neg Hx    Liver disease Neg Hx    Colon polyps Neg Hx    Rectal cancer Neg Hx     SOCIAL HISTORY: Social History   Tobacco Use   Smoking status: Former  Types: Cigarettes    Quit date: 06/19/1990    Years since quitting: 32.0   Smokeless tobacco: Never   Tobacco comments:    rarely cigar  Vaping Use   Vaping Use: Never used  Substance Use Topics   Alcohol use: Yes    Alcohol/week: 1.0 standard drink of alcohol    Types: 1 Cans of beer per week    Comment: occasional beer   Drug use: Not Currently    Types: Marijuana    Comment: Last use several yrs ago   Social History   Social History Narrative   Lives alone at home with brother   12 years of education    2 children    Not married    Are you right handed or left handed? Rtight   Are you currently employed ? retired   What is your current occupation?   Do you live at home alone?   Who lives with you?    What type of home do you live in: 1 story or 2 story? One   Caffeine 1 cup a day        Objective:  Vital Signs:  There were no vitals taken for this visit.  General:*** General appearance: Awake and alert. No distress. Cooperative with exam.  Skin: No obvious rash or jaundice. HEENT: Atraumatic. Anicteric. Lungs: Non-labored breathing on room air  Heart: Regular Abdomen: Soft, non tender. Extremities: No edema. No obvious deformity.  Musculoskeletal: No obvious joint swelling.  Neurological: Mental Status: Alert. Speech fluent. No pseudobulbar affect Cranial Nerves: CNII: No RAPD. Visual fields intact. CNIII, IV, VI: PERRL. No nystagmus. EOMI. CN V: Facial sensation intact bilaterally to fine touch. [place] clench strong. Jaw jerk***. CN VII: Facial muscles symmetric and strong. No ptosis at rest or after sustained  upgaze***. CN VIII: Hears finger rub well bilaterally. CN IX: No hypophonia. CN X: Palate elevates symmetrically. CN XI: Full strength shoulder shrug bilaterally. CN XII: Tongue protrusion full and midline. No atrophy or fasciculations. No significant dysarthria*** Motor: Tone is ***. *** fasciculations in *** extremities. *** atrophy. No grip or percussive myotonia.  Individual muscle group testing (MRC grade out of 5):  Movement     Neck flexion ***    Neck extension ***     Right Left   Shoulder abduction *** ***   Shoulder adduction *** ***   Shoulder ext rotation *** ***   Shoulder int rotation *** ***   Elbow flexion *** ***   Elbow extension *** ***   Wrist extension *** ***   Wrist flexion *** ***   Finger abduction - FDI *** ***   Finger abduction - ADM *** ***   Finger extension *** ***   Finger distal flexion - 2/3 *** ***   Finger distal flexion - 4/5 *** ***   Thumb flexion - FPL *** ***   Thumb abduction - APB *** ***    Hip flexion *** ***   Hip extension *** ***   Hip adduction *** ***   Hip abduction *** ***   Knee extension *** ***   Knee flexion *** ***   Dorsiflexion *** ***   Plantarflexion *** ***   Inversion *** ***   Eversion *** ***   Great toe extension *** ***   Great toe flexion *** ***     Reflexes:  Right Left  Bicep *** ***  Tricep *** ***  BrRad *** ***  Knee *** ***  Ankle *** ***  Pathological Reflexes: Babinski: *** response bilaterally*** Hoffman: *** Troemner: *** Pectoral: *** Palmomental: *** Facial: *** Midline tap: *** Sensation: Pinprick: *** Vibration: *** Temperature: *** Proprioception: *** Coordination: Intact finger-to- nose-finger and heel-to-shin bilaterally. Romberg negative.*** Gait: Able to rise from chair with arms crossed unassisted. Normal, narrow-based gait. Able to tandem walk. Able to walk on toes and heels.***   Lab and Test Review: New results: IFE (01/16/22): no M protein LFTs  (03/01/22): unremarkable CBC (03/01/22): mild anemia (Hb 11.8)  EMG (06/19/22): NCV & EMG Findings: Extensive electrodiagnostic evaluation of the right and left upper limb shows: Bilateral median and bilateral ulnar sensory responses are absent. Bilateral radial sensory responses show reduced amplitudes (right 5, left 8 V). Left median (APB) motor response shows prolonged distal onset latency (6.8 ms) and reduced amplitude (1.19 mV). Right median (APB) motor response shows prolonged distal onset latency (4.7 ms) and decreased conduction velocity (42 m/s). Left ulnar (ADM) motor response shows decreased conduction velocity (45 m/s). Right ulnar (ADM) motor response shows reduced amplitude (4.8 mV) and decreased conduction velocity across the elbow (40 m/s). Chronic motor axon loss changes without accompanying active denervation changes are seen in bilateral first dorsal interosseous, bilateral abductor pollicis brevis, bilateral flexor digitorum profundus to digits 4,5, and right abductor digiti minimi.   Impression: This is a complex, abnormal study. The right upper limb is complicated by extensive dialysis fistula that presents technical challenges. With those caveats noted, the findings are most consistent with the following: Upper limb manifestations of a peripheral neuropathy, predominantly axon loss in type, likely severe in degree electrically. Bilateral median mononeuropathy at or distal to the wrist, consistent with carpal tunnel syndrome, severe in degree bilaterally, but left appears worse than right. Bilateral ulnar mononeuropathy proximal to the take off to the flexor digitorum profundus to digits 4,5, likely at the elbow given the slowing of conduction velocity in that area. Findings are difficult to grade given #1 above. No definite electrodiagnostic evidence of a right or left cervical (C5-T1) motor radiculopathy.  Previously reviewed results: CBC (09/29/21): significant for anemia, Hb  5.7 (transfusion on 10/02/21) B12 (09/23/21): 1482 Folate wnl HbA1c (11/15/20): 4.8   CT head wo contrast (09/11/21): FINDINGS: Brain: No evidence of acute infarction, hemorrhage, hydrocephalus, extra-axial collection or mass lesion/mass effect. Extensive dural calcification in thickening is again identified and appears similar to the previous exam.   Vascular: No hyperdense vessel or unexpected calcification.   Skull: Normal. Negative for fracture or focal lesion.   Sinuses/Orbits: No acute finding.   Other: None   IMPRESSION: 1. No acute intracranial abnormalities. No significant change from prior exam.   MRI/MRV brain wo contrast (03/02/17): IMPRESSION:  This MRI of the brain without contrast shows the following:  1.    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 This appears stable when compared to the MRI from 12/02/2015. 2.   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 3.    There are no acute findings.  ASSESSMENT: This is Carl Bowens Sr., a 62 y.o. male with:  ***  Plan: *** -Wrist splints vs surgical referral*** -Gabapentin***  Return to clinic in ***  Total time spent reviewing records, interview, history/exam, documentation, and coordination of care on day of encounter:  *** min  Jacquelyne Balint, MD

## 2022-06-22 DIAGNOSIS — E875 Hyperkalemia: Secondary | ICD-10-CM | POA: Diagnosis not present

## 2022-06-22 DIAGNOSIS — D631 Anemia in chronic kidney disease: Secondary | ICD-10-CM | POA: Diagnosis not present

## 2022-06-22 DIAGNOSIS — N186 End stage renal disease: Secondary | ICD-10-CM | POA: Diagnosis not present

## 2022-06-22 DIAGNOSIS — N2581 Secondary hyperparathyroidism of renal origin: Secondary | ICD-10-CM | POA: Diagnosis not present

## 2022-06-22 DIAGNOSIS — D689 Coagulation defect, unspecified: Secondary | ICD-10-CM | POA: Diagnosis not present

## 2022-06-22 DIAGNOSIS — R519 Headache, unspecified: Secondary | ICD-10-CM | POA: Diagnosis not present

## 2022-06-22 DIAGNOSIS — Z992 Dependence on renal dialysis: Secondary | ICD-10-CM | POA: Diagnosis not present

## 2022-06-25 DIAGNOSIS — Z992 Dependence on renal dialysis: Secondary | ICD-10-CM | POA: Diagnosis not present

## 2022-06-25 DIAGNOSIS — D689 Coagulation defect, unspecified: Secondary | ICD-10-CM | POA: Diagnosis not present

## 2022-06-25 DIAGNOSIS — E875 Hyperkalemia: Secondary | ICD-10-CM | POA: Diagnosis not present

## 2022-06-25 DIAGNOSIS — D631 Anemia in chronic kidney disease: Secondary | ICD-10-CM | POA: Diagnosis not present

## 2022-06-25 DIAGNOSIS — N186 End stage renal disease: Secondary | ICD-10-CM | POA: Diagnosis not present

## 2022-06-25 DIAGNOSIS — R519 Headache, unspecified: Secondary | ICD-10-CM | POA: Diagnosis not present

## 2022-06-25 DIAGNOSIS — N2581 Secondary hyperparathyroidism of renal origin: Secondary | ICD-10-CM | POA: Diagnosis not present

## 2022-06-27 DIAGNOSIS — N186 End stage renal disease: Secondary | ICD-10-CM | POA: Diagnosis not present

## 2022-06-27 DIAGNOSIS — N2581 Secondary hyperparathyroidism of renal origin: Secondary | ICD-10-CM | POA: Diagnosis not present

## 2022-06-27 DIAGNOSIS — D631 Anemia in chronic kidney disease: Secondary | ICD-10-CM | POA: Diagnosis not present

## 2022-06-27 DIAGNOSIS — E875 Hyperkalemia: Secondary | ICD-10-CM | POA: Diagnosis not present

## 2022-06-27 DIAGNOSIS — R519 Headache, unspecified: Secondary | ICD-10-CM | POA: Diagnosis not present

## 2022-06-27 DIAGNOSIS — Z992 Dependence on renal dialysis: Secondary | ICD-10-CM | POA: Diagnosis not present

## 2022-06-27 DIAGNOSIS — D689 Coagulation defect, unspecified: Secondary | ICD-10-CM | POA: Diagnosis not present

## 2022-06-28 ENCOUNTER — Ambulatory Visit: Payer: 59 | Admitting: Neurology

## 2022-06-28 NOTE — Progress Notes (Deleted)
I saw Carl Parchman Sr. in neurology clinic on 06/29/22 in follow up for numbness and tingling in legs and hands.  HPI: Carl Fertitta Sr. is a 62 y.o. year old male with a history of HLD, iron deficiency anemia, ESRD on dialysis, duodenal ulcer disease, OA s/p left total hip replacement who we last saw on 01/16/22.  To briefly review: Patient has had numbness and tingling in hands for the last couple of months. It is more in the left hand than the right. It occurs mostly at night. It is a pins and needles and burning sensation. It feels like it starts in the thumb area but involves the whole hand. He has occasional neck pain, but this is not prominent. He denies any weakness in his hands.   He has occasional numbness and tingling in his feet as well.   He takes gabapentin 100 mg at night. He takes it most nights, but not every night. He takes B12 1000 mcg daily.   EtOH use: Very rare  Restrictive diet? No Family history of neuropathy/myopathy/NM disease? No   Most recent Assessment and Plan (01/16/22): His neurological examination is pertinent for atrophy of right > left intrinsic hand muscles and sensory changes in right foot. Available diagnostic data is significant for normal B12 and A1c. The etiology of patient's symptoms is currently unclear. His RUE may be due to radiculopathy (C8-T1) vs carpal tunnel and ulnar mononeuropathy. The LUE is similar but less severe. The sensory asymmetry in the lower extremities is also unusual and without clear explanation. I will work up as below.   PLAN: -Blood work: CBC, IFE -EMG -Increase gabapentin to 200 mg qhs   Since their last visit: Blood work showed improved Hb (12.0 vs 5.7 previously). IFE showed no M protein. 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 Testing was complicated by fistula on  right that limited studies that could be performed.   ***   ROS: Pertinent positive and negative systems reviewed in HPI. ***   MEDICATIONS:  Outpatient Encounter Medications as of 06/29/2022  Medication Sig   acetaminophen (TYLENOL) 650 MG CR tablet Take 1,300 mg by mouth every 8 (eight) hours as needed for pain.   amoxicillin (AMOXIL) 500 MG capsule Take 4 pills one hour prior to dental work   atorvastatin (LIPITOR) 10 MG tablet Take 10 mg by mouth 2 (two) times a week.   augmented betamethasone dipropionate (DIPROLENE-AF) 0.05 % ointment Apply 1 Application topically 2 (two) times daily as needed for rash.   cinacalcet (SENSIPAR) 60 MG tablet Take 120 mg by mouth daily with supper.   cromolyn (OPTICROM) 4 % ophthalmic solution Place 1 drop into both eyes daily as needed (redness).   ferrous sulfate 325 (65 FE) MG tablet Take 1 tablet once daily   folic acid (FOLVITE) 1 MG tablet Take 1 tablet (1 mg total) by mouth daily.   gabapentin (NEURONTIN) 100 MG capsule Take 2 capsules (200 mg total) by mouth at bedtime.   LOKELMA 10 g PACK packet Take 1 packet by mouth daily.   multivitamin (RENA-VIT) TABS tablet Take 1 tablet by mouth daily.   pantoprazole (PROTONIX) 40 MG tablet Take 1 tablet (40 mg total) by mouth 2 (two) times daily before a meal.   sevelamer carbonate (RENVELA) 800 MG tablet Take 4,000 mg by mouth 3 (three) times daily with meals. 5  tabs with meals   sucralfate (CARAFATE) 1 GM/10ML suspension Take 10 mLs (1 g total) by mouth 2 (two) times daily.   vitamin B-12 (CYANOCOBALAMIN) 1000 MCG tablet Take 1,000 mcg by mouth daily.   vitamin E 180 MG (400 UNITS) capsule Take 400 Units by mouth 3 (three) times a week.   No facility-administered encounter medications on file as of 06/29/2022.    PAST MEDICAL HISTORY: Past Medical History:  Diagnosis Date   Acute gastric ulcer with hemorrhage    Acute GI bleeding 07/31/2019   Acute pericarditis    Anemia    hx of   Anxiety     situational    Arthritis    on meds   Borderline diabetes    Cataract    bilateral sx   Chronic headaches    Depression    situational    Diverticulitis    ESRD (end stage renal disease) (HCC)     On Renal Transplant List," [facility]; MWF" (10/23/2016)   GERD (gastroesophageal reflux disease)    with certain foods   GI bleed    Hypertension    diet controlled   Parathyroid abnormality (HCC)    ectopic parathyroid gland   Presence of arteriovenous fistula for hemodialysis, primary (HCC)    RUE PER PT RLE   Refusal of blood product    NO WHOLE BLOOD PROUCTS   Renal cell carcinoma (HCC)    s/p hand assisted laparoscopic bilateral nephrectomies 11/29/17, + RCC left   Secondary hyperparathyroidism (HCC)    Seizures (HCC)    one episode in past, due to" elevated Potassium" 08/02/20- "at least 4 years ago"   Sleep apnea    doesn't use CPAP anymore since weight loss   Stroke (HCC)    no residual    PAST SURGICAL HISTORY: Past Surgical History:  Procedure Laterality Date   AV FISTULA PLACEMENT Right    right arm   BIOPSY  08/01/2019   Procedure: BIOPSY;  Surgeon: Charna Elizabeth, MD;  Location: [facility] ENDOSCOPY;  Service: Endoscopy;;   BIOPSY  11/18/2020   Procedure: BIOPSY;  Surgeon: Jenel Lucks, MD;  Location: [facility] ENDOSCOPY;  Service: Gastroenterology;;   BIOPSY  09/13/2021   Procedure: BIOPSY;  Surgeon: Lemar Lofty., MD;  Location: [facility] ENDOSCOPY;  Service: Gastroenterology;;   CATARACT EXTRACTION W/ INTRAOCULAR LENS  IMPLANT, BILATERAL     COLON SURGERY     COLONOSCOPY N/A 08/04/2015   Procedure: COLONOSCOPY;  Surgeon: Beverley Fiedler, MD;  Location: [facility] ENDOSCOPY;  Service: Endoscopy;  Laterality: N/A;   COLONOSCOPY  2017   JMP@ [facility]-good prep-mass -recall 1 yr   COLONOSCOPY N/A 09/13/2021   Procedure: COLONOSCOPY;  Surgeon: Mansouraty, Netty Starring., MD;  Location: [facility] ENDOSCOPY;  Service: Gastroenterology;  Laterality: N/A;   COLONOSCOPY WITH PROPOFOL N/A 11/25/2020    Procedure: COLONOSCOPY WITH PROPOFOL;  Surgeon: Imogene Burn, MD;  Location: [facility] ENDOSCOPY;  Service: Gastroenterology;  Laterality: N/A;   COLONOSCOPY WITH PROPOFOL N/A 09/23/2021   Procedure: COLONOSCOPY WITH PROPOFOL;  Surgeon: Shellia Cleverly, DO;  Location: [facility] ENDOSCOPY;  Service: Gastroenterology;  Laterality: N/A;   ENTEROSCOPY N/A 09/23/2021   Procedure: ENTEROSCOPY;  Surgeon: Shellia Cleverly, DO;  Location: [facility] ENDOSCOPY;  Service: Gastroenterology;  Laterality: N/A;  Will place order for video capsule study as we may opt to place it during procedure   ESOPHAGOGASTRODUODENOSCOPY N/A 08/01/2019   Procedure: ESOPHAGOGASTRODUODENOSCOPY (EGD);  Surgeon: Charna Elizabeth, MD;  Location: [facility] ENDOSCOPY;  Service: Endoscopy;  Laterality: N/A;   ESOPHAGOGASTRODUODENOSCOPY N/A 11/18/2020   Procedure: ESOPHAGOGASTRODUODENOSCOPY (EGD);  Surgeon: Jenel Lucks, MD;  Location: [facility] ENDOSCOPY;  Service: Gastroenterology;  Laterality: N/A;   ESOPHAGOGASTRODUODENOSCOPY (EGD) WITH PROPOFOL N/A 08/04/2019   Procedure: ESOPHAGOGASTRODUODENOSCOPY (EGD) WITH PROPOFOL;  Surgeon: Benancio Deeds, MD;  Location: [facility] ENDOSCOPY;  Service: Gastroenterology;  Laterality: N/A;   ESOPHAGOGASTRODUODENOSCOPY (EGD) WITH PROPOFOL N/A 09/13/2021   Procedure: ESOPHAGOGASTRODUODENOSCOPY (EGD) WITH PROPOFOL;  Surgeon: Meridee Score Netty Starring., MD;  Location: [facility] ENDOSCOPY;  Service: Gastroenterology;  Laterality: N/A;   GIVENS CAPSULE STUDY N/A 09/23/2021   Procedure: GIVENS CAPSULE STUDY;  Surgeon: Shellia Cleverly, DO;  Location: [facility] ENDOSCOPY;  Service: Gastroenterology;  Laterality: N/A;   graft left arm Left    for dialysis x 2. Removed   HOT HEMOSTASIS  11/18/2020   Procedure: HOT HEMOSTASIS (ARGON PLASMA COAGULATION/BICAP);  Surgeon: Jenel Lucks, MD;  Location: [facility] ENDOSCOPY;  Service: Gastroenterology;;   HOT HEMOSTASIS N/A 09/23/2021   Procedure: HOT HEMOSTASIS (ARGON PLASMA COAGULATION/BICAP);  Surgeon:  Shellia Cleverly, DO;  Location: [facility] ENDOSCOPY;  Service: Gastroenterology;  Laterality: N/A;   INSERTION OF DIALYSIS CATHETER     Rt chest   LAPAROSCOPIC RIGHT COLECTOMY N/A 08/05/2015   Procedure: LAPAROSCOPIC RIGHT COLECTOMY- ASCENDING;  Surgeon: Almond Lint, MD;  Location: [facility] OR;  Service: General;  Laterality: N/A;   MASS EXCISION Left 05/28/2019   Procedure: EXCISION SOFT TISSUE MASS LEFT SHOULDER;  Surgeon: Darnell Level, MD;  Location: [facility] ORS;  Service: General;  Laterality: Left;   NEPHRECTOMY Bilateral    PARATHYROIDECTOMY N/A 06/12/2016   Procedure: TOTAL PARATHYROIDECTOMY WITH AUTOTRANSPLANTATION TO LEFT FOREARM;  Surgeon: Darnell Level, MD;  Location: [facility] OR;  Service: General;  Laterality: N/A;   PARATHYROIDECTOMY N/A 10/23/2016   Procedure: PARATHYROIDECTOMY;  Surgeon: Darnell Level, MD;  Location: [facility] OR;  Service: General;  Laterality: N/A;   REVERSE SHOULDER ARTHROPLASTY Right 08/24/2020   Procedure: REVERSE SHOULDER ARTHROPLASTY;  Surgeon: Bjorn Pippin, MD;  Location: [facility] OR;  Service: Orthopedics;  Laterality: Right;   REVISON OF ARTERIOVENOUS FISTULA Right 07/16/2017   Procedure: REVISION OF ARTERIOVENOUS FISTULA  Right ARM;  Surgeon: Maeola Harman, MD;  Location: [facility] OR;  Service: Vascular;  Laterality: Right;   TOTAL HIP ARTHROPLASTY Left 11/14/2020   Procedure: LEFT TOTAL HIP ARTHROPLASTY ANTERIOR APPROACH;  Surgeon: Tarry Kos, MD;  Location: [facility] OR;  Service: Orthopedics;  Laterality: Left;   UPPER GASTROINTESTINAL ENDOSCOPY  2021   @ [facility]    ALLERGIES: Allergies  Allergen Reactions   Infed [Iron Dextran] Other (See Comments)    Decreased BP    Oxycodone Nausea Only   Vicodin [Hydrocodone-Acetaminophen] Other (See Comments)    Decreased BP   Diclofenac Other (See Comments)    Unknown    FAMILY HISTORY: Family History  Problem Relation Age of Onset   Diabetes Father    Stroke Father    Hypertension Father    Uterine cancer Mother    Lupus  Sister    Stroke Sister    Hypertension Sister    Anuerysm Brother        brain   Colon cancer Neg Hx    Esophageal cancer Neg Hx    Stomach cancer Neg Hx    Pancreatic cancer Neg Hx    Liver disease Neg Hx    Colon polyps Neg Hx    Rectal cancer Neg Hx     SOCIAL HISTORY: Social History   Tobacco Use  Smoking status: Former    Types: Cigarettes    Quit date: 06/19/1990    Years since quitting: 32.0   Smokeless tobacco: Never   Tobacco comments:    rarely cigar  Vaping Use   Vaping Use: Never used  Substance Use Topics   Alcohol use: Yes    Alcohol/week: 1.0 standard drink of alcohol    Types: 1 Cans of beer per week    Comment: occasional beer   Drug use: Not Currently    Types: Marijuana    Comment: Last use several yrs ago   Social History   Social History Narrative   Lives alone at home with brother   12 years of education    2 children    Not married    Are you right handed or left handed? Rtight   Are you currently employed ? retired   What is your current occupation?   Do you live at home alone?   Who lives with you?    What type of home do you live in: 1 story or 2 story? One   Caffeine 1 cup a day        Objective:  Vital Signs:  There were no vitals taken for this visit.  General:*** General appearance: Awake and alert. No distress. Cooperative with exam.  Skin: No obvious rash or jaundice. HEENT: Atraumatic. Anicteric. Lungs: Non-labored breathing on room air  Heart: Regular Abdomen: Soft, non tender. Extremities: No edema. No obvious deformity.  Musculoskeletal: No obvious joint swelling.  Neurological: Mental Status: Alert. Speech fluent. No pseudobulbar affect Cranial Nerves: CNII: No RAPD. Visual fields intact. CNIII, IV, VI: PERRL. No nystagmus. EOMI. CN V: Facial sensation intact bilaterally to fine touch. Masseter clench strong. Jaw jerk***. CN VII: Facial muscles symmetric and strong. No ptosis at rest or after sustained  upgaze***. CN VIII: Hears finger rub well bilaterally. CN IX: No hypophonia. CN X: Palate elevates symmetrically. CN XI: Full strength shoulder shrug bilaterally. CN XII: Tongue protrusion full and midline. No atrophy or fasciculations. No significant dysarthria*** Motor: Tone is ***. *** fasciculations in *** extremities. *** atrophy. No grip or percussive myotonia.  Individual muscle group testing (MRC grade out of 5):  Movement     Neck flexion ***    Neck extension ***     Right Left   Shoulder abduction *** ***   Shoulder adduction *** ***   Shoulder ext rotation *** ***   Shoulder int rotation *** ***   Elbow flexion *** ***   Elbow extension *** ***   Wrist extension *** ***   Wrist flexion *** ***   Finger abduction - FDI *** ***   Finger abduction - ADM *** ***   Finger extension *** ***   Finger distal flexion - 2/3 *** ***   Finger distal flexion - 4/5 *** ***   Thumb flexion - FPL *** ***   Thumb abduction - APB *** ***    Hip flexion *** ***   Hip extension *** ***   Hip adduction *** ***   Hip abduction *** ***   Knee extension *** ***   Knee flexion *** ***   Dorsiflexion *** ***   Plantarflexion *** ***   Inversion *** ***   Eversion *** ***   Great toe extension *** ***   Great toe flexion *** ***     Reflexes:  Right Left  Bicep *** ***  Tricep *** ***  BrRad *** ***  Knee *** ***  Ankle *** ***   Pathological Reflexes: Babinski: *** response bilaterally*** Hoffman: *** Troemner: *** Pectoral: *** Palmomental: *** Facial: *** Midline tap: *** Sensation: Pinprick: *** Vibration: *** Temperature: *** Proprioception: *** Coordination: Intact finger-to- nose-finger and heel-to-shin bilaterally. Romberg negative.*** Gait: Able to rise from chair with arms crossed unassisted. Normal, narrow-based gait. Able to tandem walk. Able to walk on toes and heels.***   New results: IFE (01/16/22): no M protein LFTs (03/01/22):  unremarkable CBC (03/01/22): mild anemia (Hb 11.8)   EMG (06/19/22): NCV & EMG Findings: Extensive electrodiagnostic evaluation of the right and left upper limb shows: Bilateral median and bilateral ulnar sensory responses are absent. Bilateral radial sensory responses show reduced amplitudes (right 5, left 8 V). Left median (APB) motor response shows prolonged distal onset latency (6.8 ms) and reduced amplitude (1.19 mV). Right median (APB) motor response shows prolonged distal onset latency (4.7 ms) and decreased conduction velocity (42 m/s). Left ulnar (ADM) motor response shows decreased conduction velocity (45 m/s). Right ulnar (ADM) motor response shows reduced amplitude (4.8 mV) and decreased conduction velocity across the elbow (40 m/s). Chronic motor axon loss changes without accompanying active denervation changes are seen in bilateral first dorsal interosseous, bilateral abductor pollicis brevis, bilateral flexor digitorum profundus to digits 4,5, and right abductor digiti minimi.   Impression: This is a complex, abnormal study. The right upper limb is complicated by extensive dialysis fistula that presents technical challenges. With those caveats noted, the findings are most consistent with the following: Upper limb manifestations of a peripheral neuropathy, predominantly axon loss in type, likely severe in degree electrically. Bilateral median mononeuropathy at or distal to the wrist, consistent with carpal tunnel syndrome, severe in degree bilaterally, but left appears worse than right. Bilateral ulnar mononeuropathy proximal to the take off to the flexor digitorum profundus to digits 4,5, likely at the elbow given the slowing of conduction velocity in that area. Findings are difficult to grade given #1 above. No definite electrodiagnostic evidence of a right or left cervical (C5-T1) motor radiculopathy.   Previously reviewed results: CBC (09/29/21): significant for anemia, Hb 5.7  (transfusion on 10/02/21) B12 (09/23/21): 1482 Folate wnl HbA1c (11/15/20): 4.8   CT head wo contrast (09/11/21): FINDINGS: Brain: No evidence of acute infarction, hemorrhage, hydrocephalus, extra-axial collection or mass lesion/mass effect. Extensive dural calcification in thickening is again identified and appears similar to the previous exam.   Vascular: No hyperdense vessel or unexpected calcification.   Skull: Normal. Negative for fracture or focal lesion.   Sinuses/Orbits: No acute finding.   Other: None   IMPRESSION: 1. No acute intracranial abnormalities. No significant change from prior exam.   MRI/MRV brain wo contrast (03/02/17): IMPRESSION:  This MRI of the brain without contrast shows the following:  1.    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 This appears stable when compared to the MRI from 12/02/2015. 2.   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 3.    There are no acute findings.  ASSESSMENT: This is Conan Bowens Sr., a 62 y.o. male with:  ***  Plan: *** -Wrist splints vs surgical referral*** -Gabapentin*** -Genetic testing?***  Return to clinic in ***  Total time spent reviewing records, interview, history/exam, documentation, and coordination of care on day of encounter:  *** min  Jacquelyne Balint, MD

## 2022-06-29 ENCOUNTER — Encounter: Payer: Self-pay | Admitting: Neurology

## 2022-06-29 ENCOUNTER — Ambulatory Visit: Payer: 59 | Admitting: Neurology

## 2022-06-29 DIAGNOSIS — N2581 Secondary hyperparathyroidism of renal origin: Secondary | ICD-10-CM | POA: Diagnosis not present

## 2022-06-29 DIAGNOSIS — E875 Hyperkalemia: Secondary | ICD-10-CM | POA: Diagnosis not present

## 2022-06-29 DIAGNOSIS — N186 End stage renal disease: Secondary | ICD-10-CM | POA: Diagnosis not present

## 2022-06-29 DIAGNOSIS — Z992 Dependence on renal dialysis: Secondary | ICD-10-CM | POA: Diagnosis not present

## 2022-06-29 DIAGNOSIS — R519 Headache, unspecified: Secondary | ICD-10-CM | POA: Diagnosis not present

## 2022-06-29 DIAGNOSIS — D689 Coagulation defect, unspecified: Secondary | ICD-10-CM | POA: Diagnosis not present

## 2022-06-29 DIAGNOSIS — D631 Anemia in chronic kidney disease: Secondary | ICD-10-CM | POA: Diagnosis not present

## 2022-07-02 DIAGNOSIS — Z992 Dependence on renal dialysis: Secondary | ICD-10-CM | POA: Diagnosis not present

## 2022-07-02 DIAGNOSIS — D689 Coagulation defect, unspecified: Secondary | ICD-10-CM | POA: Diagnosis not present

## 2022-07-02 DIAGNOSIS — R519 Headache, unspecified: Secondary | ICD-10-CM | POA: Diagnosis not present

## 2022-07-02 DIAGNOSIS — N186 End stage renal disease: Secondary | ICD-10-CM | POA: Diagnosis not present

## 2022-07-02 DIAGNOSIS — E875 Hyperkalemia: Secondary | ICD-10-CM | POA: Diagnosis not present

## 2022-07-02 DIAGNOSIS — D631 Anemia in chronic kidney disease: Secondary | ICD-10-CM | POA: Diagnosis not present

## 2022-07-02 DIAGNOSIS — N2581 Secondary hyperparathyroidism of renal origin: Secondary | ICD-10-CM | POA: Diagnosis not present

## 2022-07-04 ENCOUNTER — Telehealth: Payer: Self-pay | Admitting: Neurology

## 2022-07-04 DIAGNOSIS — D689 Coagulation defect, unspecified: Secondary | ICD-10-CM | POA: Diagnosis not present

## 2022-07-04 DIAGNOSIS — R519 Headache, unspecified: Secondary | ICD-10-CM | POA: Diagnosis not present

## 2022-07-04 DIAGNOSIS — N2581 Secondary hyperparathyroidism of renal origin: Secondary | ICD-10-CM | POA: Diagnosis not present

## 2022-07-04 DIAGNOSIS — N186 End stage renal disease: Secondary | ICD-10-CM | POA: Diagnosis not present

## 2022-07-04 DIAGNOSIS — Z992 Dependence on renal dialysis: Secondary | ICD-10-CM | POA: Diagnosis not present

## 2022-07-04 DIAGNOSIS — E875 Hyperkalemia: Secondary | ICD-10-CM | POA: Diagnosis not present

## 2022-07-04 DIAGNOSIS — D631 Anemia in chronic kidney disease: Secondary | ICD-10-CM | POA: Diagnosis not present

## 2022-07-04 NOTE — Telephone Encounter (Signed)
Patient dismissed from [facility] and all providers practicing at this clinic. This decision is based on our office No Show Policy 06/29/22

## 2022-07-06 DIAGNOSIS — R519 Headache, unspecified: Secondary | ICD-10-CM | POA: Diagnosis not present

## 2022-07-06 DIAGNOSIS — N186 End stage renal disease: Secondary | ICD-10-CM | POA: Diagnosis not present

## 2022-07-06 DIAGNOSIS — N2581 Secondary hyperparathyroidism of renal origin: Secondary | ICD-10-CM | POA: Diagnosis not present

## 2022-07-06 DIAGNOSIS — D689 Coagulation defect, unspecified: Secondary | ICD-10-CM | POA: Diagnosis not present

## 2022-07-06 DIAGNOSIS — D631 Anemia in chronic kidney disease: Secondary | ICD-10-CM | POA: Diagnosis not present

## 2022-07-06 DIAGNOSIS — E875 Hyperkalemia: Secondary | ICD-10-CM | POA: Diagnosis not present

## 2022-07-06 DIAGNOSIS — Z992 Dependence on renal dialysis: Secondary | ICD-10-CM | POA: Diagnosis not present

## 2022-07-09 DIAGNOSIS — N2581 Secondary hyperparathyroidism of renal origin: Secondary | ICD-10-CM | POA: Diagnosis not present

## 2022-07-09 DIAGNOSIS — R519 Headache, unspecified: Secondary | ICD-10-CM | POA: Diagnosis not present

## 2022-07-09 DIAGNOSIS — E875 Hyperkalemia: Secondary | ICD-10-CM | POA: Diagnosis not present

## 2022-07-09 DIAGNOSIS — N186 End stage renal disease: Secondary | ICD-10-CM | POA: Diagnosis not present

## 2022-07-09 DIAGNOSIS — D631 Anemia in chronic kidney disease: Secondary | ICD-10-CM | POA: Diagnosis not present

## 2022-07-09 DIAGNOSIS — Z992 Dependence on renal dialysis: Secondary | ICD-10-CM | POA: Diagnosis not present

## 2022-07-09 DIAGNOSIS — D689 Coagulation defect, unspecified: Secondary | ICD-10-CM | POA: Diagnosis not present

## 2022-07-11 DIAGNOSIS — N2581 Secondary hyperparathyroidism of renal origin: Secondary | ICD-10-CM | POA: Diagnosis not present

## 2022-07-11 DIAGNOSIS — E875 Hyperkalemia: Secondary | ICD-10-CM | POA: Diagnosis not present

## 2022-07-11 DIAGNOSIS — Z992 Dependence on renal dialysis: Secondary | ICD-10-CM | POA: Diagnosis not present

## 2022-07-11 DIAGNOSIS — D689 Coagulation defect, unspecified: Secondary | ICD-10-CM | POA: Diagnosis not present

## 2022-07-11 DIAGNOSIS — R519 Headache, unspecified: Secondary | ICD-10-CM | POA: Diagnosis not present

## 2022-07-11 DIAGNOSIS — D631 Anemia in chronic kidney disease: Secondary | ICD-10-CM | POA: Diagnosis not present

## 2022-07-11 DIAGNOSIS — N186 End stage renal disease: Secondary | ICD-10-CM | POA: Diagnosis not present

## 2022-07-13 DIAGNOSIS — Z992 Dependence on renal dialysis: Secondary | ICD-10-CM | POA: Diagnosis not present

## 2022-07-13 DIAGNOSIS — I12 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 stage renal disease: Secondary | ICD-10-CM | POA: Diagnosis not present

## 2022-07-13 DIAGNOSIS — R519 Headache, unspecified: Secondary | ICD-10-CM | POA: Diagnosis not present

## 2022-07-13 DIAGNOSIS — N2581 Secondary hyperparathyroidism of renal origin: Secondary | ICD-10-CM | POA: Diagnosis not present

## 2022-07-13 DIAGNOSIS — E875 Hyperkalemia: Secondary | ICD-10-CM | POA: Diagnosis not present

## 2022-07-13 DIAGNOSIS — N186 End stage renal disease: Secondary | ICD-10-CM | POA: Diagnosis not present

## 2022-07-13 DIAGNOSIS — D689 Coagulation defect, unspecified: Secondary | ICD-10-CM | POA: Diagnosis not present

## 2022-07-13 DIAGNOSIS — D631 Anemia in chronic kidney disease: Secondary | ICD-10-CM | POA: Diagnosis not present

## 2022-07-16 DIAGNOSIS — D689 Coagulation defect, unspecified: Secondary | ICD-10-CM | POA: Diagnosis not present

## 2022-07-16 DIAGNOSIS — R519 Headache, unspecified: Secondary | ICD-10-CM | POA: Diagnosis not present

## 2022-07-16 DIAGNOSIS — D509 Iron deficiency anemia, unspecified: Secondary | ICD-10-CM | POA: Diagnosis not present

## 2022-07-16 DIAGNOSIS — D631 Anemia in chronic kidney disease: Secondary | ICD-10-CM | POA: Diagnosis not present

## 2022-07-16 DIAGNOSIS — N186 End stage renal disease: Secondary | ICD-10-CM | POA: Diagnosis not present

## 2022-07-16 DIAGNOSIS — Z992 Dependence on renal dialysis: Secondary | ICD-10-CM | POA: Diagnosis not present

## 2022-07-16 DIAGNOSIS — N2581 Secondary hyperparathyroidism of renal origin: Secondary | ICD-10-CM | POA: Diagnosis not present

## 2022-07-16 DIAGNOSIS — E875 Hyperkalemia: Secondary | ICD-10-CM | POA: Diagnosis not present

## 2022-07-16 DIAGNOSIS — Z23 Encounter for immunization: Secondary | ICD-10-CM | POA: Diagnosis not present

## 2022-07-16 DIAGNOSIS — R197 Diarrhea, unspecified: Secondary | ICD-10-CM | POA: Diagnosis not present

## 2022-07-18 DIAGNOSIS — Z23 Encounter for immunization: Secondary | ICD-10-CM | POA: Diagnosis not present

## 2022-07-18 DIAGNOSIS — N2581 Secondary hyperparathyroidism of renal origin: Secondary | ICD-10-CM | POA: Diagnosis not present

## 2022-07-18 DIAGNOSIS — R197 Diarrhea, unspecified: Secondary | ICD-10-CM | POA: Diagnosis not present

## 2022-07-18 DIAGNOSIS — Z992 Dependence on renal dialysis: Secondary | ICD-10-CM | POA: Diagnosis not present

## 2022-07-18 DIAGNOSIS — D689 Coagulation defect, unspecified: Secondary | ICD-10-CM | POA: Diagnosis not present

## 2022-07-18 DIAGNOSIS — D631 Anemia in chronic kidney disease: Secondary | ICD-10-CM | POA: Diagnosis not present

## 2022-07-18 DIAGNOSIS — R519 Headache, unspecified: Secondary | ICD-10-CM | POA: Diagnosis not present

## 2022-07-18 DIAGNOSIS — E875 Hyperkalemia: Secondary | ICD-10-CM | POA: Diagnosis not present

## 2022-07-18 DIAGNOSIS — N186 End stage renal disease: Secondary | ICD-10-CM | POA: Diagnosis not present

## 2022-07-18 DIAGNOSIS — D509 Iron deficiency anemia, unspecified: Secondary | ICD-10-CM | POA: Diagnosis not present

## 2022-07-20 DIAGNOSIS — Z992 Dependence on renal dialysis: Secondary | ICD-10-CM | POA: Diagnosis not present

## 2022-07-20 DIAGNOSIS — D509 Iron deficiency anemia, unspecified: Secondary | ICD-10-CM | POA: Diagnosis not present

## 2022-07-20 DIAGNOSIS — D631 Anemia in chronic kidney disease: Secondary | ICD-10-CM | POA: Diagnosis not present

## 2022-07-20 DIAGNOSIS — R197 Diarrhea, unspecified: Secondary | ICD-10-CM | POA: Diagnosis not present

## 2022-07-20 DIAGNOSIS — R519 Headache, unspecified: Secondary | ICD-10-CM | POA: Diagnosis not present

## 2022-07-20 DIAGNOSIS — D689 Coagulation defect, unspecified: Secondary | ICD-10-CM | POA: Diagnosis not present

## 2022-07-20 DIAGNOSIS — N186 End stage renal disease: Secondary | ICD-10-CM | POA: Diagnosis not present

## 2022-07-20 DIAGNOSIS — E875 Hyperkalemia: Secondary | ICD-10-CM | POA: Diagnosis not present

## 2022-07-20 DIAGNOSIS — Z23 Encounter for immunization: Secondary | ICD-10-CM | POA: Diagnosis not present

## 2022-07-20 DIAGNOSIS — N2581 Secondary hyperparathyroidism of renal origin: Secondary | ICD-10-CM | POA: Diagnosis not present

## 2022-07-23 DIAGNOSIS — D509 Iron deficiency anemia, unspecified: Secondary | ICD-10-CM | POA: Diagnosis not present

## 2022-07-23 DIAGNOSIS — N186 End stage renal disease: Secondary | ICD-10-CM | POA: Diagnosis not present

## 2022-07-23 DIAGNOSIS — E875 Hyperkalemia: Secondary | ICD-10-CM | POA: Diagnosis not present

## 2022-07-23 DIAGNOSIS — R519 Headache, unspecified: Secondary | ICD-10-CM | POA: Diagnosis not present

## 2022-07-23 DIAGNOSIS — D631 Anemia in chronic kidney disease: Secondary | ICD-10-CM | POA: Diagnosis not present

## 2022-07-23 DIAGNOSIS — N2581 Secondary hyperparathyroidism of renal origin: Secondary | ICD-10-CM | POA: Diagnosis not present

## 2022-07-23 DIAGNOSIS — D689 Coagulation defect, unspecified: Secondary | ICD-10-CM | POA: Diagnosis not present

## 2022-07-23 DIAGNOSIS — Z992 Dependence on renal dialysis: Secondary | ICD-10-CM | POA: Diagnosis not present

## 2022-07-23 DIAGNOSIS — Z23 Encounter for immunization: Secondary | ICD-10-CM | POA: Diagnosis not present

## 2022-07-23 DIAGNOSIS — R197 Diarrhea, unspecified: Secondary | ICD-10-CM | POA: Diagnosis not present

## 2022-07-24 DIAGNOSIS — N186 End stage renal disease: Secondary | ICD-10-CM | POA: Diagnosis not present

## 2022-07-24 DIAGNOSIS — Z992 Dependence on renal dialysis: Secondary | ICD-10-CM | POA: Diagnosis not present

## 2022-07-24 DIAGNOSIS — G629 Polyneuropathy, unspecified: Secondary | ICD-10-CM | POA: Diagnosis not present

## 2022-07-25 DIAGNOSIS — R197 Diarrhea, unspecified: Secondary | ICD-10-CM | POA: Diagnosis not present

## 2022-07-25 DIAGNOSIS — N186 End stage renal disease: Secondary | ICD-10-CM | POA: Diagnosis not present

## 2022-07-25 DIAGNOSIS — N2581 Secondary hyperparathyroidism of renal origin: Secondary | ICD-10-CM | POA: Diagnosis not present

## 2022-07-25 DIAGNOSIS — D509 Iron deficiency anemia, unspecified: Secondary | ICD-10-CM | POA: Diagnosis not present

## 2022-07-25 DIAGNOSIS — D631 Anemia in chronic kidney disease: Secondary | ICD-10-CM | POA: Diagnosis not present

## 2022-07-25 DIAGNOSIS — R519 Headache, unspecified: Secondary | ICD-10-CM | POA: Diagnosis not present

## 2022-07-25 DIAGNOSIS — E875 Hyperkalemia: Secondary | ICD-10-CM | POA: Diagnosis not present

## 2022-07-25 DIAGNOSIS — D689 Coagulation defect, unspecified: Secondary | ICD-10-CM | POA: Diagnosis not present

## 2022-07-25 DIAGNOSIS — Z992 Dependence on renal dialysis: Secondary | ICD-10-CM | POA: Diagnosis not present

## 2022-07-25 DIAGNOSIS — Z23 Encounter for immunization: Secondary | ICD-10-CM | POA: Diagnosis not present

## 2022-07-27 DIAGNOSIS — D631 Anemia in chronic kidney disease: Secondary | ICD-10-CM | POA: Diagnosis not present

## 2022-07-27 DIAGNOSIS — N2581 Secondary hyperparathyroidism of renal origin: Secondary | ICD-10-CM | POA: Diagnosis not present

## 2022-07-27 DIAGNOSIS — D509 Iron deficiency anemia, unspecified: Secondary | ICD-10-CM | POA: Diagnosis not present

## 2022-07-27 DIAGNOSIS — N186 End stage renal disease: Secondary | ICD-10-CM | POA: Diagnosis not present

## 2022-07-27 DIAGNOSIS — Z23 Encounter for immunization: Secondary | ICD-10-CM | POA: Diagnosis not present

## 2022-07-27 DIAGNOSIS — D689 Coagulation defect, unspecified: Secondary | ICD-10-CM | POA: Diagnosis not present

## 2022-07-27 DIAGNOSIS — E875 Hyperkalemia: Secondary | ICD-10-CM | POA: Diagnosis not present

## 2022-07-27 DIAGNOSIS — R197 Diarrhea, unspecified: Secondary | ICD-10-CM | POA: Diagnosis not present

## 2022-07-27 DIAGNOSIS — R519 Headache, unspecified: Secondary | ICD-10-CM | POA: Diagnosis not present

## 2022-07-27 DIAGNOSIS — Z992 Dependence on renal dialysis: Secondary | ICD-10-CM | POA: Diagnosis not present

## 2022-07-30 DIAGNOSIS — Z992 Dependence on renal dialysis: Secondary | ICD-10-CM | POA: Diagnosis not present

## 2022-07-30 DIAGNOSIS — D509 Iron deficiency anemia, unspecified: Secondary | ICD-10-CM | POA: Diagnosis not present

## 2022-07-30 DIAGNOSIS — N2581 Secondary hyperparathyroidism of renal origin: Secondary | ICD-10-CM | POA: Diagnosis not present

## 2022-07-30 DIAGNOSIS — R197 Diarrhea, unspecified: Secondary | ICD-10-CM | POA: Diagnosis not present

## 2022-07-30 DIAGNOSIS — E875 Hyperkalemia: Secondary | ICD-10-CM | POA: Diagnosis not present

## 2022-07-30 DIAGNOSIS — D689 Coagulation defect, unspecified: Secondary | ICD-10-CM | POA: Diagnosis not present

## 2022-07-30 DIAGNOSIS — N186 End stage renal disease: Secondary | ICD-10-CM | POA: Diagnosis not present

## 2022-07-30 DIAGNOSIS — Z23 Encounter for immunization: Secondary | ICD-10-CM | POA: Diagnosis not present

## 2022-07-30 DIAGNOSIS — R519 Headache, unspecified: Secondary | ICD-10-CM | POA: Diagnosis not present

## 2022-07-30 DIAGNOSIS — D631 Anemia in chronic kidney disease: Secondary | ICD-10-CM | POA: Diagnosis not present

## 2022-08-01 DIAGNOSIS — D631 Anemia in chronic kidney disease: Secondary | ICD-10-CM | POA: Diagnosis not present

## 2022-08-01 DIAGNOSIS — N186 End stage renal disease: Secondary | ICD-10-CM | POA: Diagnosis not present

## 2022-08-01 DIAGNOSIS — Z23 Encounter for immunization: Secondary | ICD-10-CM | POA: Diagnosis not present

## 2022-08-01 DIAGNOSIS — D689 Coagulation defect, unspecified: Secondary | ICD-10-CM | POA: Diagnosis not present

## 2022-08-01 DIAGNOSIS — R519 Headache, unspecified: Secondary | ICD-10-CM | POA: Diagnosis not present

## 2022-08-01 DIAGNOSIS — R197 Diarrhea, unspecified: Secondary | ICD-10-CM | POA: Diagnosis not present

## 2022-08-01 DIAGNOSIS — D509 Iron deficiency anemia, unspecified: Secondary | ICD-10-CM | POA: Diagnosis not present

## 2022-08-01 DIAGNOSIS — Z992 Dependence on renal dialysis: Secondary | ICD-10-CM | POA: Diagnosis not present

## 2022-08-01 DIAGNOSIS — E875 Hyperkalemia: Secondary | ICD-10-CM | POA: Diagnosis not present

## 2022-08-01 DIAGNOSIS — H6123 Impacted cerumen, bilateral: Secondary | ICD-10-CM | POA: Diagnosis not present

## 2022-08-01 DIAGNOSIS — N2581 Secondary hyperparathyroidism of renal origin: Secondary | ICD-10-CM | POA: Diagnosis not present

## 2022-08-03 DIAGNOSIS — Z992 Dependence on renal dialysis: Secondary | ICD-10-CM | POA: Diagnosis not present

## 2022-08-03 DIAGNOSIS — Z23 Encounter for immunization: Secondary | ICD-10-CM | POA: Diagnosis not present

## 2022-08-03 DIAGNOSIS — D509 Iron deficiency anemia, unspecified: Secondary | ICD-10-CM | POA: Diagnosis not present

## 2022-08-03 DIAGNOSIS — R519 Headache, unspecified: Secondary | ICD-10-CM | POA: Diagnosis not present

## 2022-08-03 DIAGNOSIS — R197 Diarrhea, unspecified: Secondary | ICD-10-CM | POA: Diagnosis not present

## 2022-08-03 DIAGNOSIS — E875 Hyperkalemia: Secondary | ICD-10-CM | POA: Diagnosis not present

## 2022-08-03 DIAGNOSIS — N2581 Secondary hyperparathyroidism of renal origin: Secondary | ICD-10-CM | POA: Diagnosis not present

## 2022-08-03 DIAGNOSIS — N186 End stage renal disease: Secondary | ICD-10-CM | POA: Diagnosis not present

## 2022-08-03 DIAGNOSIS — D689 Coagulation defect, unspecified: Secondary | ICD-10-CM | POA: Diagnosis not present

## 2022-08-03 DIAGNOSIS — D631 Anemia in chronic kidney disease: Secondary | ICD-10-CM | POA: Diagnosis not present

## 2022-08-06 DIAGNOSIS — E875 Hyperkalemia: Secondary | ICD-10-CM | POA: Diagnosis not present

## 2022-08-06 DIAGNOSIS — D689 Coagulation defect, unspecified: Secondary | ICD-10-CM | POA: Diagnosis not present

## 2022-08-06 DIAGNOSIS — D509 Iron deficiency anemia, unspecified: Secondary | ICD-10-CM | POA: Diagnosis not present

## 2022-08-06 DIAGNOSIS — N186 End stage renal disease: Secondary | ICD-10-CM | POA: Diagnosis not present

## 2022-08-06 DIAGNOSIS — Z23 Encounter for immunization: Secondary | ICD-10-CM | POA: Diagnosis not present

## 2022-08-06 DIAGNOSIS — N2581 Secondary hyperparathyroidism of renal origin: Secondary | ICD-10-CM | POA: Diagnosis not present

## 2022-08-06 DIAGNOSIS — R197 Diarrhea, unspecified: Secondary | ICD-10-CM | POA: Diagnosis not present

## 2022-08-06 DIAGNOSIS — R519 Headache, unspecified: Secondary | ICD-10-CM | POA: Diagnosis not present

## 2022-08-06 DIAGNOSIS — D631 Anemia in chronic kidney disease: Secondary | ICD-10-CM | POA: Diagnosis not present

## 2022-08-06 DIAGNOSIS — Z992 Dependence on renal dialysis: Secondary | ICD-10-CM | POA: Diagnosis not present

## 2022-08-08 DIAGNOSIS — E875 Hyperkalemia: Secondary | ICD-10-CM | POA: Diagnosis not present

## 2022-08-08 DIAGNOSIS — D689 Coagulation defect, unspecified: Secondary | ICD-10-CM | POA: Diagnosis not present

## 2022-08-08 DIAGNOSIS — R519 Headache, unspecified: Secondary | ICD-10-CM | POA: Diagnosis not present

## 2022-08-08 DIAGNOSIS — Z23 Encounter for immunization: Secondary | ICD-10-CM | POA: Diagnosis not present

## 2022-08-08 DIAGNOSIS — R197 Diarrhea, unspecified: Secondary | ICD-10-CM | POA: Diagnosis not present

## 2022-08-08 DIAGNOSIS — Z992 Dependence on renal dialysis: Secondary | ICD-10-CM | POA: Diagnosis not present

## 2022-08-08 DIAGNOSIS — D509 Iron deficiency anemia, unspecified: Secondary | ICD-10-CM | POA: Diagnosis not present

## 2022-08-08 DIAGNOSIS — N2581 Secondary hyperparathyroidism of renal origin: Secondary | ICD-10-CM | POA: Diagnosis not present

## 2022-08-08 DIAGNOSIS — D631 Anemia in chronic kidney disease: Secondary | ICD-10-CM | POA: Diagnosis not present

## 2022-08-08 DIAGNOSIS — N186 End stage renal disease: Secondary | ICD-10-CM | POA: Diagnosis not present

## 2022-08-10 DIAGNOSIS — D509 Iron deficiency anemia, unspecified: Secondary | ICD-10-CM | POA: Diagnosis not present

## 2022-08-10 DIAGNOSIS — E875 Hyperkalemia: Secondary | ICD-10-CM | POA: Diagnosis not present

## 2022-08-10 DIAGNOSIS — Z23 Encounter for immunization: Secondary | ICD-10-CM | POA: Diagnosis not present

## 2022-08-10 DIAGNOSIS — N186 End stage renal disease: Secondary | ICD-10-CM | POA: Diagnosis not present

## 2022-08-10 DIAGNOSIS — Z992 Dependence on renal dialysis: Secondary | ICD-10-CM | POA: Diagnosis not present

## 2022-08-10 DIAGNOSIS — D689 Coagulation defect, unspecified: Secondary | ICD-10-CM | POA: Diagnosis not present

## 2022-08-10 DIAGNOSIS — R197 Diarrhea, unspecified: Secondary | ICD-10-CM | POA: Diagnosis not present

## 2022-08-10 DIAGNOSIS — R519 Headache, unspecified: Secondary | ICD-10-CM | POA: Diagnosis not present

## 2022-08-10 DIAGNOSIS — D631 Anemia in chronic kidney disease: Secondary | ICD-10-CM | POA: Diagnosis not present

## 2022-08-10 DIAGNOSIS — N2581 Secondary hyperparathyroidism of renal origin: Secondary | ICD-10-CM | POA: Diagnosis not present

## 2022-08-12 DIAGNOSIS — N186 End stage renal disease: Secondary | ICD-10-CM | POA: Diagnosis not present

## 2022-08-12 DIAGNOSIS — Z992 Dependence on renal dialysis: Secondary | ICD-10-CM | POA: Diagnosis not present

## 2022-08-12 DIAGNOSIS — I12 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 stage renal disease: Secondary | ICD-10-CM | POA: Diagnosis not present

## 2022-08-13 DIAGNOSIS — D631 Anemia in chronic kidney disease: Secondary | ICD-10-CM | POA: Diagnosis not present

## 2022-08-13 DIAGNOSIS — N186 End stage renal disease: Secondary | ICD-10-CM | POA: Diagnosis not present

## 2022-08-13 DIAGNOSIS — N2581 Secondary hyperparathyroidism of renal origin: Secondary | ICD-10-CM | POA: Diagnosis not present

## 2022-08-13 DIAGNOSIS — Z992 Dependence on renal dialysis: Secondary | ICD-10-CM | POA: Diagnosis not present

## 2022-08-13 DIAGNOSIS — D689 Coagulation defect, unspecified: Secondary | ICD-10-CM | POA: Diagnosis not present

## 2022-08-13 DIAGNOSIS — E875 Hyperkalemia: Secondary | ICD-10-CM | POA: Diagnosis not present

## 2022-08-13 DIAGNOSIS — E1129 Type 2 diabetes mellitus with other diabetic kidney complication: Secondary | ICD-10-CM | POA: Diagnosis not present

## 2022-08-13 DIAGNOSIS — R197 Diarrhea, unspecified: Secondary | ICD-10-CM | POA: Diagnosis not present

## 2022-08-13 DIAGNOSIS — R519 Headache, unspecified: Secondary | ICD-10-CM | POA: Diagnosis not present

## 2022-08-13 DIAGNOSIS — T782XXS Anaphylactic shock, unspecified, sequela: Secondary | ICD-10-CM | POA: Diagnosis not present

## 2022-08-13 DIAGNOSIS — D509 Iron deficiency anemia, unspecified: Secondary | ICD-10-CM | POA: Diagnosis not present

## 2022-08-15 DIAGNOSIS — E1129 Type 2 diabetes mellitus with other diabetic kidney complication: Secondary | ICD-10-CM | POA: Diagnosis not present

## 2022-08-15 DIAGNOSIS — N2581 Secondary hyperparathyroidism of renal origin: Secondary | ICD-10-CM | POA: Diagnosis not present

## 2022-08-15 DIAGNOSIS — D689 Coagulation defect, unspecified: Secondary | ICD-10-CM | POA: Diagnosis not present

## 2022-08-15 DIAGNOSIS — N186 End stage renal disease: Secondary | ICD-10-CM | POA: Diagnosis not present

## 2022-08-15 DIAGNOSIS — E875 Hyperkalemia: Secondary | ICD-10-CM | POA: Diagnosis not present

## 2022-08-15 DIAGNOSIS — T782XXS Anaphylactic shock, unspecified, sequela: Secondary | ICD-10-CM | POA: Diagnosis not present

## 2022-08-15 DIAGNOSIS — D509 Iron deficiency anemia, unspecified: Secondary | ICD-10-CM | POA: Diagnosis not present

## 2022-08-15 DIAGNOSIS — Z992 Dependence on renal dialysis: Secondary | ICD-10-CM | POA: Diagnosis not present

## 2022-08-15 DIAGNOSIS — D631 Anemia in chronic kidney disease: Secondary | ICD-10-CM | POA: Diagnosis not present

## 2022-08-15 DIAGNOSIS — R519 Headache, unspecified: Secondary | ICD-10-CM | POA: Diagnosis not present

## 2022-08-15 DIAGNOSIS — R197 Diarrhea, unspecified: Secondary | ICD-10-CM | POA: Diagnosis not present

## 2022-08-17 DIAGNOSIS — R519 Headache, unspecified: Secondary | ICD-10-CM | POA: Diagnosis not present

## 2022-08-17 DIAGNOSIS — D509 Iron deficiency anemia, unspecified: Secondary | ICD-10-CM | POA: Diagnosis not present

## 2022-08-17 DIAGNOSIS — D689 Coagulation defect, unspecified: Secondary | ICD-10-CM | POA: Diagnosis not present

## 2022-08-17 DIAGNOSIS — D631 Anemia in chronic kidney disease: Secondary | ICD-10-CM | POA: Diagnosis not present

## 2022-08-17 DIAGNOSIS — E875 Hyperkalemia: Secondary | ICD-10-CM | POA: Diagnosis not present

## 2022-08-17 DIAGNOSIS — R197 Diarrhea, unspecified: Secondary | ICD-10-CM | POA: Diagnosis not present

## 2022-08-17 DIAGNOSIS — N186 End stage renal disease: Secondary | ICD-10-CM | POA: Diagnosis not present

## 2022-08-17 DIAGNOSIS — Z992 Dependence on renal dialysis: Secondary | ICD-10-CM | POA: Diagnosis not present

## 2022-08-17 DIAGNOSIS — E1129 Type 2 diabetes mellitus with other diabetic kidney complication: Secondary | ICD-10-CM | POA: Diagnosis not present

## 2022-08-17 DIAGNOSIS — T782XXS Anaphylactic shock, unspecified, sequela: Secondary | ICD-10-CM | POA: Diagnosis not present

## 2022-08-17 DIAGNOSIS — N2581 Secondary hyperparathyroidism of renal origin: Secondary | ICD-10-CM | POA: Diagnosis not present

## 2022-08-20 DIAGNOSIS — D689 Coagulation defect, unspecified: Secondary | ICD-10-CM | POA: Diagnosis not present

## 2022-08-20 DIAGNOSIS — T782XXS Anaphylactic shock, unspecified, sequela: Secondary | ICD-10-CM | POA: Diagnosis not present

## 2022-08-20 DIAGNOSIS — Z992 Dependence on renal dialysis: Secondary | ICD-10-CM | POA: Diagnosis not present

## 2022-08-20 DIAGNOSIS — E1129 Type 2 diabetes mellitus with other diabetic kidney complication: Secondary | ICD-10-CM | POA: Diagnosis not present

## 2022-08-20 DIAGNOSIS — D509 Iron deficiency anemia, unspecified: Secondary | ICD-10-CM | POA: Diagnosis not present

## 2022-08-20 DIAGNOSIS — R519 Headache, unspecified: Secondary | ICD-10-CM | POA: Diagnosis not present

## 2022-08-20 DIAGNOSIS — E875 Hyperkalemia: Secondary | ICD-10-CM | POA: Diagnosis not present

## 2022-08-20 DIAGNOSIS — N2581 Secondary hyperparathyroidism of renal origin: Secondary | ICD-10-CM | POA: Diagnosis not present

## 2022-08-20 DIAGNOSIS — N186 End stage renal disease: Secondary | ICD-10-CM | POA: Diagnosis not present

## 2022-08-20 DIAGNOSIS — D631 Anemia in chronic kidney disease: Secondary | ICD-10-CM | POA: Diagnosis not present

## 2022-08-20 DIAGNOSIS — R197 Diarrhea, unspecified: Secondary | ICD-10-CM | POA: Diagnosis not present

## 2022-08-22 DIAGNOSIS — D509 Iron deficiency anemia, unspecified: Secondary | ICD-10-CM | POA: Diagnosis not present

## 2022-08-22 DIAGNOSIS — N186 End stage renal disease: Secondary | ICD-10-CM | POA: Diagnosis not present

## 2022-08-22 DIAGNOSIS — E875 Hyperkalemia: Secondary | ICD-10-CM | POA: Diagnosis not present

## 2022-08-22 DIAGNOSIS — D631 Anemia in chronic kidney disease: Secondary | ICD-10-CM | POA: Diagnosis not present

## 2022-08-22 DIAGNOSIS — T782XXS Anaphylactic shock, unspecified, sequela: Secondary | ICD-10-CM | POA: Diagnosis not present

## 2022-08-22 DIAGNOSIS — N2581 Secondary hyperparathyroidism of renal origin: Secondary | ICD-10-CM | POA: Diagnosis not present

## 2022-08-22 DIAGNOSIS — E1129 Type 2 diabetes mellitus with other diabetic kidney complication: Secondary | ICD-10-CM | POA: Diagnosis not present

## 2022-08-22 DIAGNOSIS — Z992 Dependence on renal dialysis: Secondary | ICD-10-CM | POA: Diagnosis not present

## 2022-08-22 DIAGNOSIS — R519 Headache, unspecified: Secondary | ICD-10-CM | POA: Diagnosis not present

## 2022-08-22 DIAGNOSIS — D689 Coagulation defect, unspecified: Secondary | ICD-10-CM | POA: Diagnosis not present

## 2022-08-22 DIAGNOSIS — R197 Diarrhea, unspecified: Secondary | ICD-10-CM | POA: Diagnosis not present

## 2022-08-24 DIAGNOSIS — D689 Coagulation defect, unspecified: Secondary | ICD-10-CM | POA: Diagnosis not present

## 2022-08-24 DIAGNOSIS — R197 Diarrhea, unspecified: Secondary | ICD-10-CM | POA: Diagnosis not present

## 2022-08-24 DIAGNOSIS — Z992 Dependence on renal dialysis: Secondary | ICD-10-CM | POA: Diagnosis not present

## 2022-08-24 DIAGNOSIS — E875 Hyperkalemia: Secondary | ICD-10-CM | POA: Diagnosis not present

## 2022-08-24 DIAGNOSIS — E1129 Type 2 diabetes mellitus with other diabetic kidney complication: Secondary | ICD-10-CM | POA: Diagnosis not present

## 2022-08-24 DIAGNOSIS — D509 Iron deficiency anemia, unspecified: Secondary | ICD-10-CM | POA: Diagnosis not present

## 2022-08-24 DIAGNOSIS — T782XXS Anaphylactic shock, unspecified, sequela: Secondary | ICD-10-CM | POA: Diagnosis not present

## 2022-08-24 DIAGNOSIS — D631 Anemia in chronic kidney disease: Secondary | ICD-10-CM | POA: Diagnosis not present

## 2022-08-24 DIAGNOSIS — R519 Headache, unspecified: Secondary | ICD-10-CM | POA: Diagnosis not present

## 2022-08-24 DIAGNOSIS — N186 End stage renal disease: Secondary | ICD-10-CM | POA: Diagnosis not present

## 2022-08-24 DIAGNOSIS — N2581 Secondary hyperparathyroidism of renal origin: Secondary | ICD-10-CM | POA: Diagnosis not present

## 2022-08-27 DIAGNOSIS — R519 Headache, unspecified: Secondary | ICD-10-CM | POA: Diagnosis not present

## 2022-08-27 DIAGNOSIS — T782XXS Anaphylactic shock, unspecified, sequela: Secondary | ICD-10-CM | POA: Diagnosis not present

## 2022-08-27 DIAGNOSIS — Z992 Dependence on renal dialysis: Secondary | ICD-10-CM | POA: Diagnosis not present

## 2022-08-27 DIAGNOSIS — E1129 Type 2 diabetes mellitus with other diabetic kidney complication: Secondary | ICD-10-CM | POA: Diagnosis not present

## 2022-08-27 DIAGNOSIS — N186 End stage renal disease: Secondary | ICD-10-CM | POA: Diagnosis not present

## 2022-08-27 DIAGNOSIS — D689 Coagulation defect, unspecified: Secondary | ICD-10-CM | POA: Diagnosis not present

## 2022-08-27 DIAGNOSIS — D509 Iron deficiency anemia, unspecified: Secondary | ICD-10-CM | POA: Diagnosis not present

## 2022-08-27 DIAGNOSIS — N2581 Secondary hyperparathyroidism of renal origin: Secondary | ICD-10-CM | POA: Diagnosis not present

## 2022-08-27 DIAGNOSIS — E875 Hyperkalemia: Secondary | ICD-10-CM | POA: Diagnosis not present

## 2022-08-27 DIAGNOSIS — R197 Diarrhea, unspecified: Secondary | ICD-10-CM | POA: Diagnosis not present

## 2022-08-27 DIAGNOSIS — D631 Anemia in chronic kidney disease: Secondary | ICD-10-CM | POA: Diagnosis not present

## 2022-08-29 DIAGNOSIS — D631 Anemia in chronic kidney disease: Secondary | ICD-10-CM | POA: Diagnosis not present

## 2022-08-29 DIAGNOSIS — Z992 Dependence on renal dialysis: Secondary | ICD-10-CM | POA: Diagnosis not present

## 2022-08-29 DIAGNOSIS — T782XXS Anaphylactic shock, unspecified, sequela: Secondary | ICD-10-CM | POA: Diagnosis not present

## 2022-08-29 DIAGNOSIS — R519 Headache, unspecified: Secondary | ICD-10-CM | POA: Diagnosis not present

## 2022-08-29 DIAGNOSIS — D509 Iron deficiency anemia, unspecified: Secondary | ICD-10-CM | POA: Diagnosis not present

## 2022-08-29 DIAGNOSIS — E1129 Type 2 diabetes mellitus with other diabetic kidney complication: Secondary | ICD-10-CM | POA: Diagnosis not present

## 2022-08-29 DIAGNOSIS — N186 End stage renal disease: Secondary | ICD-10-CM | POA: Diagnosis not present

## 2022-08-29 DIAGNOSIS — R197 Diarrhea, unspecified: Secondary | ICD-10-CM | POA: Diagnosis not present

## 2022-08-29 DIAGNOSIS — D689 Coagulation defect, unspecified: Secondary | ICD-10-CM | POA: Diagnosis not present

## 2022-08-29 DIAGNOSIS — N2581 Secondary hyperparathyroidism of renal origin: Secondary | ICD-10-CM | POA: Diagnosis not present

## 2022-08-29 DIAGNOSIS — E875 Hyperkalemia: Secondary | ICD-10-CM | POA: Diagnosis not present

## 2022-08-31 DIAGNOSIS — E1129 Type 2 diabetes mellitus with other diabetic kidney complication: Secondary | ICD-10-CM | POA: Diagnosis not present

## 2022-08-31 DIAGNOSIS — R197 Diarrhea, unspecified: Secondary | ICD-10-CM | POA: Diagnosis not present

## 2022-08-31 DIAGNOSIS — D631 Anemia in chronic kidney disease: Secondary | ICD-10-CM | POA: Diagnosis not present

## 2022-08-31 DIAGNOSIS — Z992 Dependence on renal dialysis: Secondary | ICD-10-CM | POA: Diagnosis not present

## 2022-08-31 DIAGNOSIS — D509 Iron deficiency anemia, unspecified: Secondary | ICD-10-CM | POA: Diagnosis not present

## 2022-08-31 DIAGNOSIS — E875 Hyperkalemia: Secondary | ICD-10-CM | POA: Diagnosis not present

## 2022-08-31 DIAGNOSIS — R519 Headache, unspecified: Secondary | ICD-10-CM | POA: Diagnosis not present

## 2022-08-31 DIAGNOSIS — D689 Coagulation defect, unspecified: Secondary | ICD-10-CM | POA: Diagnosis not present

## 2022-08-31 DIAGNOSIS — N186 End stage renal disease: Secondary | ICD-10-CM | POA: Diagnosis not present

## 2022-08-31 DIAGNOSIS — N2581 Secondary hyperparathyroidism of renal origin: Secondary | ICD-10-CM | POA: Diagnosis not present

## 2022-08-31 DIAGNOSIS — T782XXS Anaphylactic shock, unspecified, sequela: Secondary | ICD-10-CM | POA: Diagnosis not present

## 2022-09-03 DIAGNOSIS — R197 Diarrhea, unspecified: Secondary | ICD-10-CM | POA: Diagnosis not present

## 2022-09-03 DIAGNOSIS — D631 Anemia in chronic kidney disease: Secondary | ICD-10-CM | POA: Diagnosis not present

## 2022-09-03 DIAGNOSIS — E875 Hyperkalemia: Secondary | ICD-10-CM | POA: Diagnosis not present

## 2022-09-03 DIAGNOSIS — N186 End stage renal disease: Secondary | ICD-10-CM | POA: Diagnosis not present

## 2022-09-03 DIAGNOSIS — E1129 Type 2 diabetes mellitus with other diabetic kidney complication: Secondary | ICD-10-CM | POA: Diagnosis not present

## 2022-09-03 DIAGNOSIS — D509 Iron deficiency anemia, unspecified: Secondary | ICD-10-CM | POA: Diagnosis not present

## 2022-09-03 DIAGNOSIS — Z992 Dependence on renal dialysis: Secondary | ICD-10-CM | POA: Diagnosis not present

## 2022-09-03 DIAGNOSIS — R519 Headache, unspecified: Secondary | ICD-10-CM | POA: Diagnosis not present

## 2022-09-03 DIAGNOSIS — T782XXS Anaphylactic shock, unspecified, sequela: Secondary | ICD-10-CM | POA: Diagnosis not present

## 2022-09-03 DIAGNOSIS — D689 Coagulation defect, unspecified: Secondary | ICD-10-CM | POA: Diagnosis not present

## 2022-09-03 DIAGNOSIS — N2581 Secondary hyperparathyroidism of renal origin: Secondary | ICD-10-CM | POA: Diagnosis not present

## 2022-09-05 DIAGNOSIS — N2581 Secondary hyperparathyroidism of renal origin: Secondary | ICD-10-CM | POA: Diagnosis not present

## 2022-09-05 DIAGNOSIS — E875 Hyperkalemia: Secondary | ICD-10-CM | POA: Diagnosis not present

## 2022-09-05 DIAGNOSIS — R519 Headache, unspecified: Secondary | ICD-10-CM | POA: Diagnosis not present

## 2022-09-05 DIAGNOSIS — D689 Coagulation defect, unspecified: Secondary | ICD-10-CM | POA: Diagnosis not present

## 2022-09-05 DIAGNOSIS — N186 End stage renal disease: Secondary | ICD-10-CM | POA: Diagnosis not present

## 2022-09-05 DIAGNOSIS — D631 Anemia in chronic kidney disease: Secondary | ICD-10-CM | POA: Diagnosis not present

## 2022-09-05 DIAGNOSIS — D509 Iron deficiency anemia, unspecified: Secondary | ICD-10-CM | POA: Diagnosis not present

## 2022-09-05 DIAGNOSIS — R197 Diarrhea, unspecified: Secondary | ICD-10-CM | POA: Diagnosis not present

## 2022-09-05 DIAGNOSIS — Z992 Dependence on renal dialysis: Secondary | ICD-10-CM | POA: Diagnosis not present

## 2022-09-05 DIAGNOSIS — E1129 Type 2 diabetes mellitus with other diabetic kidney complication: Secondary | ICD-10-CM | POA: Diagnosis not present

## 2022-09-05 DIAGNOSIS — T782XXS Anaphylactic shock, unspecified, sequela: Secondary | ICD-10-CM | POA: Diagnosis not present

## 2022-09-07 DIAGNOSIS — E875 Hyperkalemia: Secondary | ICD-10-CM | POA: Diagnosis not present

## 2022-09-07 DIAGNOSIS — E1129 Type 2 diabetes mellitus with other diabetic kidney complication: Secondary | ICD-10-CM | POA: Diagnosis not present

## 2022-09-07 DIAGNOSIS — N2581 Secondary hyperparathyroidism of renal origin: Secondary | ICD-10-CM | POA: Diagnosis not present

## 2022-09-07 DIAGNOSIS — D509 Iron deficiency anemia, unspecified: Secondary | ICD-10-CM | POA: Diagnosis not present

## 2022-09-07 DIAGNOSIS — N186 End stage renal disease: Secondary | ICD-10-CM | POA: Diagnosis not present

## 2022-09-07 DIAGNOSIS — Z992 Dependence on renal dialysis: Secondary | ICD-10-CM | POA: Diagnosis not present

## 2022-09-07 DIAGNOSIS — R197 Diarrhea, unspecified: Secondary | ICD-10-CM | POA: Diagnosis not present

## 2022-09-07 DIAGNOSIS — D689 Coagulation defect, unspecified: Secondary | ICD-10-CM | POA: Diagnosis not present

## 2022-09-07 DIAGNOSIS — D631 Anemia in chronic kidney disease: Secondary | ICD-10-CM | POA: Diagnosis not present

## 2022-09-07 DIAGNOSIS — T782XXS Anaphylactic shock, unspecified, sequela: Secondary | ICD-10-CM | POA: Diagnosis not present

## 2022-09-07 DIAGNOSIS — R519 Headache, unspecified: Secondary | ICD-10-CM | POA: Diagnosis not present

## 2022-09-10 DIAGNOSIS — D689 Coagulation defect, unspecified: Secondary | ICD-10-CM | POA: Diagnosis not present

## 2022-09-10 DIAGNOSIS — N186 End stage renal disease: Secondary | ICD-10-CM | POA: Diagnosis not present

## 2022-09-10 DIAGNOSIS — D631 Anemia in chronic kidney disease: Secondary | ICD-10-CM | POA: Diagnosis not present

## 2022-09-10 DIAGNOSIS — E1129 Type 2 diabetes mellitus with other diabetic kidney complication: Secondary | ICD-10-CM | POA: Diagnosis not present

## 2022-09-10 DIAGNOSIS — R197 Diarrhea, unspecified: Secondary | ICD-10-CM | POA: Diagnosis not present

## 2022-09-10 DIAGNOSIS — E875 Hyperkalemia: Secondary | ICD-10-CM | POA: Diagnosis not present

## 2022-09-10 DIAGNOSIS — N2581 Secondary hyperparathyroidism of renal origin: Secondary | ICD-10-CM | POA: Diagnosis not present

## 2022-09-10 DIAGNOSIS — Z992 Dependence on renal dialysis: Secondary | ICD-10-CM | POA: Diagnosis not present

## 2022-09-10 DIAGNOSIS — D509 Iron deficiency anemia, unspecified: Secondary | ICD-10-CM | POA: Diagnosis not present

## 2022-09-10 DIAGNOSIS — R519 Headache, unspecified: Secondary | ICD-10-CM | POA: Diagnosis not present

## 2022-09-10 DIAGNOSIS — T782XXS Anaphylactic shock, unspecified, sequela: Secondary | ICD-10-CM | POA: Diagnosis not present

## 2022-09-12 DIAGNOSIS — D509 Iron deficiency anemia, unspecified: Secondary | ICD-10-CM | POA: Diagnosis not present

## 2022-09-12 DIAGNOSIS — R197 Diarrhea, unspecified: Secondary | ICD-10-CM | POA: Diagnosis not present

## 2022-09-12 DIAGNOSIS — N186 End stage renal disease: Secondary | ICD-10-CM | POA: Diagnosis not present

## 2022-09-12 DIAGNOSIS — D631 Anemia in chronic kidney disease: Secondary | ICD-10-CM | POA: Diagnosis not present

## 2022-09-12 DIAGNOSIS — E1129 Type 2 diabetes mellitus with other diabetic kidney complication: Secondary | ICD-10-CM | POA: Diagnosis not present

## 2022-09-12 DIAGNOSIS — D689 Coagulation defect, unspecified: Secondary | ICD-10-CM | POA: Diagnosis not present

## 2022-09-12 DIAGNOSIS — E875 Hyperkalemia: Secondary | ICD-10-CM | POA: Diagnosis not present

## 2022-09-12 DIAGNOSIS — I12 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 stage renal disease: Secondary | ICD-10-CM | POA: Diagnosis not present

## 2022-09-12 DIAGNOSIS — N2581 Secondary hyperparathyroidism of renal origin: Secondary | ICD-10-CM | POA: Diagnosis not present

## 2022-09-12 DIAGNOSIS — Z992 Dependence on renal dialysis: Secondary | ICD-10-CM | POA: Diagnosis not present

## 2022-09-12 DIAGNOSIS — T782XXS Anaphylactic shock, unspecified, sequela: Secondary | ICD-10-CM | POA: Diagnosis not present

## 2022-09-12 DIAGNOSIS — R519 Headache, unspecified: Secondary | ICD-10-CM | POA: Diagnosis not present

## 2022-09-14 DIAGNOSIS — Z992 Dependence on renal dialysis: Secondary | ICD-10-CM | POA: Diagnosis not present

## 2022-09-14 DIAGNOSIS — R197 Diarrhea, unspecified: Secondary | ICD-10-CM | POA: Diagnosis not present

## 2022-09-14 DIAGNOSIS — R519 Headache, unspecified: Secondary | ICD-10-CM | POA: Diagnosis not present

## 2022-09-14 DIAGNOSIS — E875 Hyperkalemia: Secondary | ICD-10-CM | POA: Diagnosis not present

## 2022-09-14 DIAGNOSIS — D689 Coagulation defect, unspecified: Secondary | ICD-10-CM | POA: Diagnosis not present

## 2022-09-14 DIAGNOSIS — D631 Anemia in chronic kidney disease: Secondary | ICD-10-CM | POA: Diagnosis not present

## 2022-09-14 DIAGNOSIS — N2581 Secondary hyperparathyroidism of renal origin: Secondary | ICD-10-CM | POA: Diagnosis not present

## 2022-09-14 DIAGNOSIS — D509 Iron deficiency anemia, unspecified: Secondary | ICD-10-CM | POA: Diagnosis not present

## 2022-09-14 DIAGNOSIS — N186 End stage renal disease: Secondary | ICD-10-CM | POA: Diagnosis not present

## 2022-09-17 DIAGNOSIS — N2581 Secondary hyperparathyroidism of renal origin: Secondary | ICD-10-CM | POA: Diagnosis not present

## 2022-09-17 DIAGNOSIS — R519 Headache, unspecified: Secondary | ICD-10-CM | POA: Diagnosis not present

## 2022-09-17 DIAGNOSIS — N186 End stage renal disease: Secondary | ICD-10-CM | POA: Diagnosis not present

## 2022-09-17 DIAGNOSIS — Z992 Dependence on renal dialysis: Secondary | ICD-10-CM | POA: Diagnosis not present

## 2022-09-17 DIAGNOSIS — R197 Diarrhea, unspecified: Secondary | ICD-10-CM | POA: Diagnosis not present

## 2022-09-17 DIAGNOSIS — D631 Anemia in chronic kidney disease: Secondary | ICD-10-CM | POA: Diagnosis not present

## 2022-09-17 DIAGNOSIS — D689 Coagulation defect, unspecified: Secondary | ICD-10-CM | POA: Diagnosis not present

## 2022-09-17 DIAGNOSIS — D509 Iron deficiency anemia, unspecified: Secondary | ICD-10-CM | POA: Diagnosis not present

## 2022-09-17 DIAGNOSIS — E875 Hyperkalemia: Secondary | ICD-10-CM | POA: Diagnosis not present

## 2022-09-19 DIAGNOSIS — R197 Diarrhea, unspecified: Secondary | ICD-10-CM | POA: Diagnosis not present

## 2022-09-19 DIAGNOSIS — Z992 Dependence on renal dialysis: Secondary | ICD-10-CM | POA: Diagnosis not present

## 2022-09-19 DIAGNOSIS — D689 Coagulation defect, unspecified: Secondary | ICD-10-CM | POA: Diagnosis not present

## 2022-09-19 DIAGNOSIS — D631 Anemia in chronic kidney disease: Secondary | ICD-10-CM | POA: Diagnosis not present

## 2022-09-19 DIAGNOSIS — N186 End stage renal disease: Secondary | ICD-10-CM | POA: Diagnosis not present

## 2022-09-19 DIAGNOSIS — D509 Iron deficiency anemia, unspecified: Secondary | ICD-10-CM | POA: Diagnosis not present

## 2022-09-19 DIAGNOSIS — N2581 Secondary hyperparathyroidism of renal origin: Secondary | ICD-10-CM | POA: Diagnosis not present

## 2022-09-19 DIAGNOSIS — E875 Hyperkalemia: Secondary | ICD-10-CM | POA: Diagnosis not present

## 2022-09-19 DIAGNOSIS — R519 Headache, unspecified: Secondary | ICD-10-CM | POA: Diagnosis not present

## 2022-09-21 DIAGNOSIS — R197 Diarrhea, unspecified: Secondary | ICD-10-CM | POA: Diagnosis not present

## 2022-09-21 DIAGNOSIS — N2581 Secondary hyperparathyroidism of renal origin: Secondary | ICD-10-CM | POA: Diagnosis not present

## 2022-09-21 DIAGNOSIS — D631 Anemia in chronic kidney disease: Secondary | ICD-10-CM | POA: Diagnosis not present

## 2022-09-21 DIAGNOSIS — D689 Coagulation defect, unspecified: Secondary | ICD-10-CM | POA: Diagnosis not present

## 2022-09-21 DIAGNOSIS — R519 Headache, unspecified: Secondary | ICD-10-CM | POA: Diagnosis not present

## 2022-09-21 DIAGNOSIS — E875 Hyperkalemia: Secondary | ICD-10-CM | POA: Diagnosis not present

## 2022-09-21 DIAGNOSIS — N186 End stage renal disease: Secondary | ICD-10-CM | POA: Diagnosis not present

## 2022-09-21 DIAGNOSIS — D509 Iron deficiency anemia, unspecified: Secondary | ICD-10-CM | POA: Diagnosis not present

## 2022-09-21 DIAGNOSIS — Z992 Dependence on renal dialysis: Secondary | ICD-10-CM | POA: Diagnosis not present

## 2022-09-24 DIAGNOSIS — D509 Iron deficiency anemia, unspecified: Secondary | ICD-10-CM | POA: Diagnosis not present

## 2022-09-24 DIAGNOSIS — E875 Hyperkalemia: Secondary | ICD-10-CM | POA: Diagnosis not present

## 2022-09-24 DIAGNOSIS — D631 Anemia in chronic kidney disease: Secondary | ICD-10-CM | POA: Diagnosis not present

## 2022-09-24 DIAGNOSIS — N2581 Secondary hyperparathyroidism of renal origin: Secondary | ICD-10-CM | POA: Diagnosis not present

## 2022-09-24 DIAGNOSIS — N186 End stage renal disease: Secondary | ICD-10-CM | POA: Diagnosis not present

## 2022-09-24 DIAGNOSIS — Z992 Dependence on renal dialysis: Secondary | ICD-10-CM | POA: Diagnosis not present

## 2022-09-24 DIAGNOSIS — R519 Headache, unspecified: Secondary | ICD-10-CM | POA: Diagnosis not present

## 2022-09-24 DIAGNOSIS — D689 Coagulation defect, unspecified: Secondary | ICD-10-CM | POA: Diagnosis not present

## 2022-09-24 DIAGNOSIS — R197 Diarrhea, unspecified: Secondary | ICD-10-CM | POA: Diagnosis not present

## 2022-09-26 DIAGNOSIS — D631 Anemia in chronic kidney disease: Secondary | ICD-10-CM | POA: Diagnosis not present

## 2022-09-26 DIAGNOSIS — D689 Coagulation defect, unspecified: Secondary | ICD-10-CM | POA: Diagnosis not present

## 2022-09-26 DIAGNOSIS — E875 Hyperkalemia: Secondary | ICD-10-CM | POA: Diagnosis not present

## 2022-09-26 DIAGNOSIS — R519 Headache, unspecified: Secondary | ICD-10-CM | POA: Diagnosis not present

## 2022-09-26 DIAGNOSIS — R197 Diarrhea, unspecified: Secondary | ICD-10-CM | POA: Diagnosis not present

## 2022-09-26 DIAGNOSIS — N2581 Secondary hyperparathyroidism of renal origin: Secondary | ICD-10-CM | POA: Diagnosis not present

## 2022-09-26 DIAGNOSIS — N186 End stage renal disease: Secondary | ICD-10-CM | POA: Diagnosis not present

## 2022-09-26 DIAGNOSIS — D509 Iron deficiency anemia, unspecified: Secondary | ICD-10-CM | POA: Diagnosis not present

## 2022-09-26 DIAGNOSIS — Z992 Dependence on renal dialysis: Secondary | ICD-10-CM | POA: Diagnosis not present

## 2022-09-28 DIAGNOSIS — N186 End stage renal disease: Secondary | ICD-10-CM | POA: Diagnosis not present

## 2022-09-28 DIAGNOSIS — E875 Hyperkalemia: Secondary | ICD-10-CM | POA: Diagnosis not present

## 2022-09-28 DIAGNOSIS — D631 Anemia in chronic kidney disease: Secondary | ICD-10-CM | POA: Diagnosis not present

## 2022-09-28 DIAGNOSIS — D689 Coagulation defect, unspecified: Secondary | ICD-10-CM | POA: Diagnosis not present

## 2022-09-28 DIAGNOSIS — Z992 Dependence on renal dialysis: Secondary | ICD-10-CM | POA: Diagnosis not present

## 2022-09-28 DIAGNOSIS — N2581 Secondary hyperparathyroidism of renal origin: Secondary | ICD-10-CM | POA: Diagnosis not present

## 2022-09-28 DIAGNOSIS — D509 Iron deficiency anemia, unspecified: Secondary | ICD-10-CM | POA: Diagnosis not present

## 2022-09-28 DIAGNOSIS — R197 Diarrhea, unspecified: Secondary | ICD-10-CM | POA: Diagnosis not present

## 2022-09-28 DIAGNOSIS — R519 Headache, unspecified: Secondary | ICD-10-CM | POA: Diagnosis not present

## 2022-10-01 DIAGNOSIS — D689 Coagulation defect, unspecified: Secondary | ICD-10-CM | POA: Diagnosis not present

## 2022-10-01 DIAGNOSIS — Z992 Dependence on renal dialysis: Secondary | ICD-10-CM | POA: Diagnosis not present

## 2022-10-01 DIAGNOSIS — R519 Headache, unspecified: Secondary | ICD-10-CM | POA: Diagnosis not present

## 2022-10-01 DIAGNOSIS — R197 Diarrhea, unspecified: Secondary | ICD-10-CM | POA: Diagnosis not present

## 2022-10-01 DIAGNOSIS — E875 Hyperkalemia: Secondary | ICD-10-CM | POA: Diagnosis not present

## 2022-10-01 DIAGNOSIS — N186 End stage renal disease: Secondary | ICD-10-CM | POA: Diagnosis not present

## 2022-10-01 DIAGNOSIS — D631 Anemia in chronic kidney disease: Secondary | ICD-10-CM | POA: Diagnosis not present

## 2022-10-01 DIAGNOSIS — N2581 Secondary hyperparathyroidism of renal origin: Secondary | ICD-10-CM | POA: Diagnosis not present

## 2022-10-01 DIAGNOSIS — D509 Iron deficiency anemia, unspecified: Secondary | ICD-10-CM | POA: Diagnosis not present

## 2022-10-03 DIAGNOSIS — R197 Diarrhea, unspecified: Secondary | ICD-10-CM | POA: Diagnosis not present

## 2022-10-03 DIAGNOSIS — D509 Iron deficiency anemia, unspecified: Secondary | ICD-10-CM | POA: Diagnosis not present

## 2022-10-03 DIAGNOSIS — D631 Anemia in chronic kidney disease: Secondary | ICD-10-CM | POA: Diagnosis not present

## 2022-10-03 DIAGNOSIS — Z992 Dependence on renal dialysis: Secondary | ICD-10-CM | POA: Diagnosis not present

## 2022-10-03 DIAGNOSIS — D689 Coagulation defect, unspecified: Secondary | ICD-10-CM | POA: Diagnosis not present

## 2022-10-03 DIAGNOSIS — N186 End stage renal disease: Secondary | ICD-10-CM | POA: Diagnosis not present

## 2022-10-03 DIAGNOSIS — R519 Headache, unspecified: Secondary | ICD-10-CM | POA: Diagnosis not present

## 2022-10-03 DIAGNOSIS — E875 Hyperkalemia: Secondary | ICD-10-CM | POA: Diagnosis not present

## 2022-10-03 DIAGNOSIS — N2581 Secondary hyperparathyroidism of renal origin: Secondary | ICD-10-CM | POA: Diagnosis not present

## 2022-10-05 DIAGNOSIS — D631 Anemia in chronic kidney disease: Secondary | ICD-10-CM | POA: Diagnosis not present

## 2022-10-05 DIAGNOSIS — R197 Diarrhea, unspecified: Secondary | ICD-10-CM | POA: Diagnosis not present

## 2022-10-05 DIAGNOSIS — D689 Coagulation defect, unspecified: Secondary | ICD-10-CM | POA: Diagnosis not present

## 2022-10-05 DIAGNOSIS — D509 Iron deficiency anemia, unspecified: Secondary | ICD-10-CM | POA: Diagnosis not present

## 2022-10-05 DIAGNOSIS — Z992 Dependence on renal dialysis: Secondary | ICD-10-CM | POA: Diagnosis not present

## 2022-10-05 DIAGNOSIS — R519 Headache, unspecified: Secondary | ICD-10-CM | POA: Diagnosis not present

## 2022-10-05 DIAGNOSIS — N2581 Secondary hyperparathyroidism of renal origin: Secondary | ICD-10-CM | POA: Diagnosis not present

## 2022-10-05 DIAGNOSIS — N186 End stage renal disease: Secondary | ICD-10-CM | POA: Diagnosis not present

## 2022-10-05 DIAGNOSIS — E875 Hyperkalemia: Secondary | ICD-10-CM | POA: Diagnosis not present

## 2022-10-08 DIAGNOSIS — Z992 Dependence on renal dialysis: Secondary | ICD-10-CM | POA: Diagnosis not present

## 2022-10-08 DIAGNOSIS — E875 Hyperkalemia: Secondary | ICD-10-CM | POA: Diagnosis not present

## 2022-10-08 DIAGNOSIS — D689 Coagulation defect, unspecified: Secondary | ICD-10-CM | POA: Diagnosis not present

## 2022-10-08 DIAGNOSIS — D631 Anemia in chronic kidney disease: Secondary | ICD-10-CM | POA: Diagnosis not present

## 2022-10-08 DIAGNOSIS — R519 Headache, unspecified: Secondary | ICD-10-CM | POA: Diagnosis not present

## 2022-10-08 DIAGNOSIS — N186 End stage renal disease: Secondary | ICD-10-CM | POA: Diagnosis not present

## 2022-10-08 DIAGNOSIS — N2581 Secondary hyperparathyroidism of renal origin: Secondary | ICD-10-CM | POA: Diagnosis not present

## 2022-10-08 DIAGNOSIS — R197 Diarrhea, unspecified: Secondary | ICD-10-CM | POA: Diagnosis not present

## 2022-10-08 DIAGNOSIS — D509 Iron deficiency anemia, unspecified: Secondary | ICD-10-CM | POA: Diagnosis not present

## 2022-10-10 DIAGNOSIS — N186 End stage renal disease: Secondary | ICD-10-CM | POA: Diagnosis not present

## 2022-10-10 DIAGNOSIS — D631 Anemia in chronic kidney disease: Secondary | ICD-10-CM | POA: Diagnosis not present

## 2022-10-10 DIAGNOSIS — R197 Diarrhea, unspecified: Secondary | ICD-10-CM | POA: Diagnosis not present

## 2022-10-10 DIAGNOSIS — D509 Iron deficiency anemia, unspecified: Secondary | ICD-10-CM | POA: Diagnosis not present

## 2022-10-10 DIAGNOSIS — E875 Hyperkalemia: Secondary | ICD-10-CM | POA: Diagnosis not present

## 2022-10-10 DIAGNOSIS — Z992 Dependence on renal dialysis: Secondary | ICD-10-CM | POA: Diagnosis not present

## 2022-10-10 DIAGNOSIS — N2581 Secondary hyperparathyroidism of renal origin: Secondary | ICD-10-CM | POA: Diagnosis not present

## 2022-10-10 DIAGNOSIS — R519 Headache, unspecified: Secondary | ICD-10-CM | POA: Diagnosis not present

## 2022-10-10 DIAGNOSIS — D689 Coagulation defect, unspecified: Secondary | ICD-10-CM | POA: Diagnosis not present

## 2022-10-12 DIAGNOSIS — D631 Anemia in chronic kidney disease: Secondary | ICD-10-CM | POA: Diagnosis not present

## 2022-10-12 DIAGNOSIS — N2581 Secondary hyperparathyroidism of renal origin: Secondary | ICD-10-CM | POA: Diagnosis not present

## 2022-10-12 DIAGNOSIS — R197 Diarrhea, unspecified: Secondary | ICD-10-CM | POA: Diagnosis not present

## 2022-10-12 DIAGNOSIS — D689 Coagulation defect, unspecified: Secondary | ICD-10-CM | POA: Diagnosis not present

## 2022-10-12 DIAGNOSIS — E875 Hyperkalemia: Secondary | ICD-10-CM | POA: Diagnosis not present

## 2022-10-12 DIAGNOSIS — D509 Iron deficiency anemia, unspecified: Secondary | ICD-10-CM | POA: Diagnosis not present

## 2022-10-12 DIAGNOSIS — Z992 Dependence on renal dialysis: Secondary | ICD-10-CM | POA: Diagnosis not present

## 2022-10-12 DIAGNOSIS — N186 End stage renal disease: Secondary | ICD-10-CM | POA: Diagnosis not present

## 2022-10-12 DIAGNOSIS — R519 Headache, unspecified: Secondary | ICD-10-CM | POA: Diagnosis not present

## 2022-10-13 DIAGNOSIS — N186 End stage renal disease: Secondary | ICD-10-CM | POA: Diagnosis not present

## 2022-10-13 DIAGNOSIS — I12 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 stage renal disease: Secondary | ICD-10-CM | POA: Diagnosis not present

## 2022-10-13 DIAGNOSIS — Z992 Dependence on renal dialysis: Secondary | ICD-10-CM | POA: Diagnosis not present

## 2022-10-15 DIAGNOSIS — Z992 Dependence on renal dialysis: Secondary | ICD-10-CM | POA: Diagnosis not present

## 2022-10-15 DIAGNOSIS — D631 Anemia in chronic kidney disease: Secondary | ICD-10-CM | POA: Diagnosis not present

## 2022-10-15 DIAGNOSIS — D689 Coagulation defect, unspecified: Secondary | ICD-10-CM | POA: Diagnosis not present

## 2022-10-15 DIAGNOSIS — N2581 Secondary hyperparathyroidism of renal origin: Secondary | ICD-10-CM | POA: Diagnosis not present

## 2022-10-15 DIAGNOSIS — E875 Hyperkalemia: Secondary | ICD-10-CM | POA: Diagnosis not present

## 2022-10-15 DIAGNOSIS — N186 End stage renal disease: Secondary | ICD-10-CM | POA: Diagnosis not present

## 2022-10-17 DIAGNOSIS — E875 Hyperkalemia: Secondary | ICD-10-CM | POA: Diagnosis not present

## 2022-10-17 DIAGNOSIS — D689 Coagulation defect, unspecified: Secondary | ICD-10-CM | POA: Diagnosis not present

## 2022-10-17 DIAGNOSIS — D631 Anemia in chronic kidney disease: Secondary | ICD-10-CM | POA: Diagnosis not present

## 2022-10-17 DIAGNOSIS — Z992 Dependence on renal dialysis: Secondary | ICD-10-CM | POA: Diagnosis not present

## 2022-10-17 DIAGNOSIS — N2581 Secondary hyperparathyroidism of renal origin: Secondary | ICD-10-CM | POA: Diagnosis not present

## 2022-10-17 DIAGNOSIS — N186 End stage renal disease: Secondary | ICD-10-CM | POA: Diagnosis not present

## 2022-10-18 DIAGNOSIS — N186 End stage renal disease: Secondary | ICD-10-CM | POA: Diagnosis not present

## 2022-10-18 DIAGNOSIS — G629 Polyneuropathy, unspecified: Secondary | ICD-10-CM | POA: Diagnosis not present

## 2022-10-18 DIAGNOSIS — Z23 Encounter for immunization: Secondary | ICD-10-CM | POA: Diagnosis not present

## 2022-10-18 DIAGNOSIS — Z992 Dependence on renal dialysis: Secondary | ICD-10-CM | POA: Diagnosis not present

## 2022-10-18 DIAGNOSIS — G5603 Carpal tunnel syndrome, bilateral upper limbs: Secondary | ICD-10-CM | POA: Diagnosis not present

## 2022-10-19 DIAGNOSIS — D631 Anemia in chronic kidney disease: Secondary | ICD-10-CM | POA: Diagnosis not present

## 2022-10-19 DIAGNOSIS — N2581 Secondary hyperparathyroidism of renal origin: Secondary | ICD-10-CM | POA: Diagnosis not present

## 2022-10-19 DIAGNOSIS — D689 Coagulation defect, unspecified: Secondary | ICD-10-CM | POA: Diagnosis not present

## 2022-10-19 DIAGNOSIS — Z992 Dependence on renal dialysis: Secondary | ICD-10-CM | POA: Diagnosis not present

## 2022-10-19 DIAGNOSIS — N186 End stage renal disease: Secondary | ICD-10-CM | POA: Diagnosis not present

## 2022-10-19 DIAGNOSIS — E875 Hyperkalemia: Secondary | ICD-10-CM | POA: Diagnosis not present

## 2022-10-22 DIAGNOSIS — D689 Coagulation defect, unspecified: Secondary | ICD-10-CM | POA: Diagnosis not present

## 2022-10-22 DIAGNOSIS — Z992 Dependence on renal dialysis: Secondary | ICD-10-CM | POA: Diagnosis not present

## 2022-10-22 DIAGNOSIS — N186 End stage renal disease: Secondary | ICD-10-CM | POA: Diagnosis not present

## 2022-10-22 DIAGNOSIS — N2581 Secondary hyperparathyroidism of renal origin: Secondary | ICD-10-CM | POA: Diagnosis not present

## 2022-10-24 DIAGNOSIS — D689 Coagulation defect, unspecified: Secondary | ICD-10-CM | POA: Diagnosis not present

## 2022-10-24 DIAGNOSIS — N2581 Secondary hyperparathyroidism of renal origin: Secondary | ICD-10-CM | POA: Diagnosis not present

## 2022-10-24 DIAGNOSIS — E875 Hyperkalemia: Secondary | ICD-10-CM | POA: Diagnosis not present

## 2022-10-24 DIAGNOSIS — Z992 Dependence on renal dialysis: Secondary | ICD-10-CM | POA: Diagnosis not present

## 2022-10-24 DIAGNOSIS — D631 Anemia in chronic kidney disease: Secondary | ICD-10-CM | POA: Diagnosis not present

## 2022-10-24 DIAGNOSIS — R197 Diarrhea, unspecified: Secondary | ICD-10-CM | POA: Diagnosis not present

## 2022-10-24 DIAGNOSIS — N186 End stage renal disease: Secondary | ICD-10-CM | POA: Diagnosis not present

## 2022-10-25 ENCOUNTER — Telehealth (HOSPITAL_BASED_OUTPATIENT_CLINIC_OR_DEPARTMENT_OTHER): Payer: Self-pay | Admitting: Cardiology

## 2022-10-25 ENCOUNTER — Other Ambulatory Visit: Payer: Self-pay | Admitting: Orthopedic Surgery

## 2022-10-25 DIAGNOSIS — M79642 Pain in left hand: Secondary | ICD-10-CM | POA: Diagnosis not present

## 2022-10-25 DIAGNOSIS — M79641 Pain in right hand: Secondary | ICD-10-CM | POA: Diagnosis not present

## 2022-10-25 DIAGNOSIS — G5620 Lesion of ulnar nerve, unspecified upper limb: Secondary | ICD-10-CM | POA: Diagnosis not present

## 2022-10-25 DIAGNOSIS — G5603 Carpal tunnel syndrome, bilateral upper limbs: Secondary | ICD-10-CM | POA: Diagnosis not present

## 2022-10-25 NOTE — Telephone Encounter (Signed)
   Pre-operative Risk Assessment    Patient Name: Carl Paddack Sr.  DOB: 05/10/1960 MRN: [id number]      Request for Surgical Clearance    Procedure:   Left carpal tunnel release and decompression ulna nerve at left elbow with possible subcutaneous transposition  Date of Surgery:  Clearance 01/03/23                                 Surgeon:  Dr. Betha Loa Surgeon's Group or Practice Name:  [facility]  Phone number:  [phone]  Fax number:  [phone]   Type of Clearance Requested:   - Medical    Type of Anesthesia:   Choice   Additional requests/questions:   Caller Steward Drone) stated patient will need medical clearance  Signed, Annetta Maw   10/25/2022, 2:33 PM

## 2022-10-26 DIAGNOSIS — N2581 Secondary hyperparathyroidism of renal origin: Secondary | ICD-10-CM | POA: Diagnosis not present

## 2022-10-26 DIAGNOSIS — R197 Diarrhea, unspecified: Secondary | ICD-10-CM | POA: Diagnosis not present

## 2022-10-26 DIAGNOSIS — Z992 Dependence on renal dialysis: Secondary | ICD-10-CM | POA: Diagnosis not present

## 2022-10-26 DIAGNOSIS — D631 Anemia in chronic kidney disease: Secondary | ICD-10-CM | POA: Diagnosis not present

## 2022-10-26 DIAGNOSIS — E875 Hyperkalemia: Secondary | ICD-10-CM | POA: Diagnosis not present

## 2022-10-26 DIAGNOSIS — N186 End stage renal disease: Secondary | ICD-10-CM | POA: Diagnosis not present

## 2022-10-26 DIAGNOSIS — D689 Coagulation defect, unspecified: Secondary | ICD-10-CM | POA: Diagnosis not present

## 2022-10-26 NOTE — Telephone Encounter (Signed)
Name: Carl Locke Sr.  DOB: 05/13/1960  MRN: [id number]  Primary Cardiologist: Jodelle Red, MD  Chart reviewed as part of pre-operative protocol coverage. The patient has an upcoming visit scheduled with Dr. Cristal Deer on 11/23/22 at which time clearance can be addressed in case there are any issues that would impact surgical recommendations.  Surgery is not scheduled until 01/05/23 as below. I added preop FYI to appointment note so that provider is aware to address at time of outpatient visit.  Per office protocol the cardiology provider should forward their finalized clearance decision and recommendations regarding antiplatelet therapy to the requesting party below.    I will route this message as FYI to requesting party and remove this message from the preop box as separate preop APP input not needed at this time.   Please call with any questions.  Perlie Gold, PA-C  10/26/2022, 8:31 AM

## 2022-10-29 DIAGNOSIS — R197 Diarrhea, unspecified: Secondary | ICD-10-CM | POA: Diagnosis not present

## 2022-10-29 DIAGNOSIS — N186 End stage renal disease: Secondary | ICD-10-CM | POA: Diagnosis not present

## 2022-10-29 DIAGNOSIS — D631 Anemia in chronic kidney disease: Secondary | ICD-10-CM | POA: Diagnosis not present

## 2022-10-29 DIAGNOSIS — D689 Coagulation defect, unspecified: Secondary | ICD-10-CM | POA: Diagnosis not present

## 2022-10-29 DIAGNOSIS — Z992 Dependence on renal dialysis: Secondary | ICD-10-CM | POA: Diagnosis not present

## 2022-10-29 DIAGNOSIS — N2581 Secondary hyperparathyroidism of renal origin: Secondary | ICD-10-CM | POA: Diagnosis not present

## 2022-10-29 DIAGNOSIS — E875 Hyperkalemia: Secondary | ICD-10-CM | POA: Diagnosis not present

## 2022-10-31 DIAGNOSIS — Z992 Dependence on renal dialysis: Secondary | ICD-10-CM | POA: Diagnosis not present

## 2022-10-31 DIAGNOSIS — R197 Diarrhea, unspecified: Secondary | ICD-10-CM | POA: Diagnosis not present

## 2022-10-31 DIAGNOSIS — D631 Anemia in chronic kidney disease: Secondary | ICD-10-CM | POA: Diagnosis not present

## 2022-10-31 DIAGNOSIS — D689 Coagulation defect, unspecified: Secondary | ICD-10-CM | POA: Diagnosis not present

## 2022-10-31 DIAGNOSIS — E875 Hyperkalemia: Secondary | ICD-10-CM | POA: Diagnosis not present

## 2022-10-31 DIAGNOSIS — N2581 Secondary hyperparathyroidism of renal origin: Secondary | ICD-10-CM | POA: Diagnosis not present

## 2022-10-31 DIAGNOSIS — N186 End stage renal disease: Secondary | ICD-10-CM | POA: Diagnosis not present

## 2022-11-02 DIAGNOSIS — D631 Anemia in chronic kidney disease: Secondary | ICD-10-CM | POA: Diagnosis not present

## 2022-11-02 DIAGNOSIS — D689 Coagulation defect, unspecified: Secondary | ICD-10-CM | POA: Diagnosis not present

## 2022-11-02 DIAGNOSIS — N186 End stage renal disease: Secondary | ICD-10-CM | POA: Diagnosis not present

## 2022-11-02 DIAGNOSIS — Z992 Dependence on renal dialysis: Secondary | ICD-10-CM | POA: Diagnosis not present

## 2022-11-02 DIAGNOSIS — R197 Diarrhea, unspecified: Secondary | ICD-10-CM | POA: Diagnosis not present

## 2022-11-02 DIAGNOSIS — E875 Hyperkalemia: Secondary | ICD-10-CM | POA: Diagnosis not present

## 2022-11-02 DIAGNOSIS — N2581 Secondary hyperparathyroidism of renal origin: Secondary | ICD-10-CM | POA: Diagnosis not present

## 2022-11-05 DIAGNOSIS — R197 Diarrhea, unspecified: Secondary | ICD-10-CM | POA: Diagnosis not present

## 2022-11-05 DIAGNOSIS — Z992 Dependence on renal dialysis: Secondary | ICD-10-CM | POA: Diagnosis not present

## 2022-11-05 DIAGNOSIS — E875 Hyperkalemia: Secondary | ICD-10-CM | POA: Diagnosis not present

## 2022-11-05 DIAGNOSIS — N186 End stage renal disease: Secondary | ICD-10-CM | POA: Diagnosis not present

## 2022-11-05 DIAGNOSIS — D689 Coagulation defect, unspecified: Secondary | ICD-10-CM | POA: Diagnosis not present

## 2022-11-05 DIAGNOSIS — N2581 Secondary hyperparathyroidism of renal origin: Secondary | ICD-10-CM | POA: Diagnosis not present

## 2022-11-05 DIAGNOSIS — D631 Anemia in chronic kidney disease: Secondary | ICD-10-CM | POA: Diagnosis not present

## 2022-11-07 DIAGNOSIS — D689 Coagulation defect, unspecified: Secondary | ICD-10-CM | POA: Diagnosis not present

## 2022-11-07 DIAGNOSIS — R197 Diarrhea, unspecified: Secondary | ICD-10-CM | POA: Diagnosis not present

## 2022-11-07 DIAGNOSIS — E875 Hyperkalemia: Secondary | ICD-10-CM | POA: Diagnosis not present

## 2022-11-07 DIAGNOSIS — N2581 Secondary hyperparathyroidism of renal origin: Secondary | ICD-10-CM | POA: Diagnosis not present

## 2022-11-07 DIAGNOSIS — D631 Anemia in chronic kidney disease: Secondary | ICD-10-CM | POA: Diagnosis not present

## 2022-11-07 DIAGNOSIS — N186 End stage renal disease: Secondary | ICD-10-CM | POA: Diagnosis not present

## 2022-11-07 DIAGNOSIS — Z992 Dependence on renal dialysis: Secondary | ICD-10-CM | POA: Diagnosis not present

## 2022-11-09 DIAGNOSIS — Z992 Dependence on renal dialysis: Secondary | ICD-10-CM | POA: Diagnosis not present

## 2022-11-09 DIAGNOSIS — N2581 Secondary hyperparathyroidism of renal origin: Secondary | ICD-10-CM | POA: Diagnosis not present

## 2022-11-09 DIAGNOSIS — D631 Anemia in chronic kidney disease: Secondary | ICD-10-CM | POA: Diagnosis not present

## 2022-11-09 DIAGNOSIS — D689 Coagulation defect, unspecified: Secondary | ICD-10-CM | POA: Diagnosis not present

## 2022-11-09 DIAGNOSIS — N186 End stage renal disease: Secondary | ICD-10-CM | POA: Diagnosis not present

## 2022-11-09 DIAGNOSIS — E875 Hyperkalemia: Secondary | ICD-10-CM | POA: Diagnosis not present

## 2022-11-09 DIAGNOSIS — R197 Diarrhea, unspecified: Secondary | ICD-10-CM | POA: Diagnosis not present

## 2022-11-12 DIAGNOSIS — D631 Anemia in chronic kidney disease: Secondary | ICD-10-CM | POA: Diagnosis not present

## 2022-11-12 DIAGNOSIS — I12 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 stage renal disease: Secondary | ICD-10-CM | POA: Diagnosis not present

## 2022-11-12 DIAGNOSIS — N186 End stage renal disease: Secondary | ICD-10-CM | POA: Diagnosis not present

## 2022-11-12 DIAGNOSIS — E875 Hyperkalemia: Secondary | ICD-10-CM | POA: Diagnosis not present

## 2022-11-12 DIAGNOSIS — R197 Diarrhea, unspecified: Secondary | ICD-10-CM | POA: Diagnosis not present

## 2022-11-12 DIAGNOSIS — N2581 Secondary hyperparathyroidism of renal origin: Secondary | ICD-10-CM | POA: Diagnosis not present

## 2022-11-12 DIAGNOSIS — D689 Coagulation defect, unspecified: Secondary | ICD-10-CM | POA: Diagnosis not present

## 2022-11-12 DIAGNOSIS — Z992 Dependence on renal dialysis: Secondary | ICD-10-CM | POA: Diagnosis not present

## 2022-11-13 ENCOUNTER — Ambulatory Visit: Payer: 59 | Admitting: Podiatry

## 2022-11-14 DIAGNOSIS — N186 End stage renal disease: Secondary | ICD-10-CM | POA: Diagnosis not present

## 2022-11-14 DIAGNOSIS — R519 Headache, unspecified: Secondary | ICD-10-CM | POA: Diagnosis not present

## 2022-11-14 DIAGNOSIS — N2581 Secondary hyperparathyroidism of renal origin: Secondary | ICD-10-CM | POA: Diagnosis not present

## 2022-11-14 DIAGNOSIS — R197 Diarrhea, unspecified: Secondary | ICD-10-CM | POA: Diagnosis not present

## 2022-11-14 DIAGNOSIS — E875 Hyperkalemia: Secondary | ICD-10-CM | POA: Diagnosis not present

## 2022-11-14 DIAGNOSIS — Z992 Dependence on renal dialysis: Secondary | ICD-10-CM | POA: Diagnosis not present

## 2022-11-14 DIAGNOSIS — D689 Coagulation defect, unspecified: Secondary | ICD-10-CM | POA: Diagnosis not present

## 2022-11-14 DIAGNOSIS — E1129 Type 2 diabetes mellitus with other diabetic kidney complication: Secondary | ICD-10-CM | POA: Diagnosis not present

## 2022-11-14 DIAGNOSIS — D631 Anemia in chronic kidney disease: Secondary | ICD-10-CM | POA: Diagnosis not present

## 2022-11-16 DIAGNOSIS — R519 Headache, unspecified: Secondary | ICD-10-CM | POA: Diagnosis not present

## 2022-11-16 DIAGNOSIS — D689 Coagulation defect, unspecified: Secondary | ICD-10-CM | POA: Diagnosis not present

## 2022-11-16 DIAGNOSIS — Z992 Dependence on renal dialysis: Secondary | ICD-10-CM | POA: Diagnosis not present

## 2022-11-16 DIAGNOSIS — E875 Hyperkalemia: Secondary | ICD-10-CM | POA: Diagnosis not present

## 2022-11-16 DIAGNOSIS — D631 Anemia in chronic kidney disease: Secondary | ICD-10-CM | POA: Diagnosis not present

## 2022-11-16 DIAGNOSIS — N2581 Secondary hyperparathyroidism of renal origin: Secondary | ICD-10-CM | POA: Diagnosis not present

## 2022-11-16 DIAGNOSIS — N186 End stage renal disease: Secondary | ICD-10-CM | POA: Diagnosis not present

## 2022-11-16 DIAGNOSIS — E1129 Type 2 diabetes mellitus with other diabetic kidney complication: Secondary | ICD-10-CM | POA: Diagnosis not present

## 2022-11-16 DIAGNOSIS — R197 Diarrhea, unspecified: Secondary | ICD-10-CM | POA: Diagnosis not present

## 2022-11-19 DIAGNOSIS — D689 Coagulation defect, unspecified: Secondary | ICD-10-CM | POA: Diagnosis not present

## 2022-11-19 DIAGNOSIS — E1129 Type 2 diabetes mellitus with other diabetic kidney complication: Secondary | ICD-10-CM | POA: Diagnosis not present

## 2022-11-19 DIAGNOSIS — R197 Diarrhea, unspecified: Secondary | ICD-10-CM | POA: Diagnosis not present

## 2022-11-19 DIAGNOSIS — D631 Anemia in chronic kidney disease: Secondary | ICD-10-CM | POA: Diagnosis not present

## 2022-11-19 DIAGNOSIS — Z992 Dependence on renal dialysis: Secondary | ICD-10-CM | POA: Diagnosis not present

## 2022-11-19 DIAGNOSIS — N2581 Secondary hyperparathyroidism of renal origin: Secondary | ICD-10-CM | POA: Diagnosis not present

## 2022-11-19 DIAGNOSIS — E875 Hyperkalemia: Secondary | ICD-10-CM | POA: Diagnosis not present

## 2022-11-19 DIAGNOSIS — N186 End stage renal disease: Secondary | ICD-10-CM | POA: Diagnosis not present

## 2022-11-19 DIAGNOSIS — R519 Headache, unspecified: Secondary | ICD-10-CM | POA: Diagnosis not present

## 2022-11-21 DIAGNOSIS — N2581 Secondary hyperparathyroidism of renal origin: Secondary | ICD-10-CM | POA: Diagnosis not present

## 2022-11-21 DIAGNOSIS — Z992 Dependence on renal dialysis: Secondary | ICD-10-CM | POA: Diagnosis not present

## 2022-11-21 DIAGNOSIS — D689 Coagulation defect, unspecified: Secondary | ICD-10-CM | POA: Diagnosis not present

## 2022-11-21 DIAGNOSIS — E875 Hyperkalemia: Secondary | ICD-10-CM | POA: Diagnosis not present

## 2022-11-21 DIAGNOSIS — D631 Anemia in chronic kidney disease: Secondary | ICD-10-CM | POA: Diagnosis not present

## 2022-11-21 DIAGNOSIS — E1129 Type 2 diabetes mellitus with other diabetic kidney complication: Secondary | ICD-10-CM | POA: Diagnosis not present

## 2022-11-21 DIAGNOSIS — R519 Headache, unspecified: Secondary | ICD-10-CM | POA: Diagnosis not present

## 2022-11-21 DIAGNOSIS — R197 Diarrhea, unspecified: Secondary | ICD-10-CM | POA: Diagnosis not present

## 2022-11-21 DIAGNOSIS — N186 End stage renal disease: Secondary | ICD-10-CM | POA: Diagnosis not present

## 2022-11-23 ENCOUNTER — Ambulatory Visit (INDEPENDENT_AMBULATORY_CARE_PROVIDER_SITE_OTHER): Payer: 59 | Admitting: Cardiology

## 2022-11-23 ENCOUNTER — Encounter (HOSPITAL_BASED_OUTPATIENT_CLINIC_OR_DEPARTMENT_OTHER): Payer: Self-pay | Admitting: Cardiology

## 2022-11-23 VITALS — BP 138/79 | HR 100 | Ht 73.0 in | Wt 191.3 lb

## 2022-11-23 DIAGNOSIS — N186 End stage renal disease: Secondary | ICD-10-CM

## 2022-11-23 DIAGNOSIS — Z01818 Encounter for other preprocedural examination: Secondary | ICD-10-CM | POA: Diagnosis not present

## 2022-11-23 DIAGNOSIS — D631 Anemia in chronic kidney disease: Secondary | ICD-10-CM | POA: Diagnosis not present

## 2022-11-23 DIAGNOSIS — Z8673 Personal history of transient ischemic attack (TIA), and cerebral infarction without residual deficits: Secondary | ICD-10-CM | POA: Diagnosis not present

## 2022-11-23 DIAGNOSIS — Z7682 Awaiting organ transplant status: Secondary | ICD-10-CM

## 2022-11-23 DIAGNOSIS — R197 Diarrhea, unspecified: Secondary | ICD-10-CM | POA: Diagnosis not present

## 2022-11-23 DIAGNOSIS — N2581 Secondary hyperparathyroidism of renal origin: Secondary | ICD-10-CM | POA: Diagnosis not present

## 2022-11-23 DIAGNOSIS — D689 Coagulation defect, unspecified: Secondary | ICD-10-CM | POA: Diagnosis not present

## 2022-11-23 DIAGNOSIS — Z992 Dependence on renal dialysis: Secondary | ICD-10-CM

## 2022-11-23 DIAGNOSIS — E1129 Type 2 diabetes mellitus with other diabetic kidney complication: Secondary | ICD-10-CM | POA: Diagnosis not present

## 2022-11-23 DIAGNOSIS — E875 Hyperkalemia: Secondary | ICD-10-CM | POA: Diagnosis not present

## 2022-11-23 DIAGNOSIS — R519 Headache, unspecified: Secondary | ICD-10-CM | POA: Diagnosis not present

## 2022-11-23 NOTE — Patient Instructions (Signed)
Medication Instructions:  Your physician recommends that you continue on your current medications as directed. Please refer to the Current Medication list given to you today.  *If you need a refill on your cardiac medications before your next appointment, please call your pharmacy*  Lab Work: NONE  Testing/Procedures: NONE  Follow-Up: At  [facility], you and your health needs are our priority.  As part of our continuing mission to provide you with exceptional heart care, we have created designated Provider Care Teams.  These Care Teams include your primary Cardiologist (physician) and Advanced Practice Providers (APPs -  Physician Assistants and Nurse Practitioners) who all work together to provide you with the care you need, when you need it.  We recommend signing up for the patient portal called "MyChart".  Sign up information is provided on this After Visit Summary.  MyChart is used to connect with patients for Virtual Visits (Telemedicine).  Patients are able to view lab/test results, encounter notes, upcoming appointments, etc.  Non-urgent messages can be sent to your provider as well.   To learn more about what you can do with MyChart, go to [email or web address].    Your next appointment:   12 month(s)  The format for your next appointment:   In Person  Provider:   Bridgette Christopher, MD     

## 2022-11-23 NOTE — Progress Notes (Signed)
Cardiology Office Note:  .    Date:  11/23/2022  ID:  Carl Bowens Sr., DOB [birth year], MRN [id number] PCP: Darrow Bussing, MD  [facility] Providers Cardiologist:  Jodelle Red, MD     History of Present Illness: .    Carl Zoss Sr. is a 62 y.o. male with a hx of ESRD on dialysis, carotid artery plaque, who is seen for follow-up. He was initially seen on 10/13/20 as a new consult at the request of Koirala, Dibas, MD for the evaluation and management of cardiovascular management pre-kidney transplant. He has not been seen in the practice since 2016 (Dr. Rennis Golden).   Cardiac history: He was told that there was something with his heart that got him kicked off the transplant list. Per note in Care Everywhere dated 09/30/19, "Called and informed that pt that he would not be a candidate for txp here d/t low BP and abnormal cardiac testing. Told that he could pursue txp with another center if he desired. He was disappointed with the decision but verbalized understanding. Eval closed appts cancelled and letter sent. 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 This is noted to be seen previously in 2018 and 2020.    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 Has had hypotension on dialysis in the past History of diabetes: has never been on medication, diet controlled History of CKD: yes, ESRD on dialysis History of OSA: yes, history in the past, but has not  needed CPAP since he lost a lot of weight History of anesthesia complications: none Current symptoms: Denies chest pain, shortness of breath at rest or with normal exertion. No PND, orthopnea, LE edema or unexpected weight gain. No syncope or palpitations.  Functional capacity: currently very limited due to leg pain, in a wheelchair today   He underwent left total hip arthroplasty which was performed on 11/14/20 by Dr. Dion Saucier.  At his visit 11/2021, he noted that his blood pressure always ran low without associated dizziness, and fluctuates on dialysis days. He had been referred to [facility] for the kidney transplant list. He complained of left arm tingling he felt was due to frequent blood draws. A few weeks prior to his appointment he had an episode of central chest pain lasting a few days possibly due to acid reflux. He had presented to the hospital in July and August 2023 for concerns of GI bleed. He reported feeling much better with no recurrent bleeding issues.   Today, he also presents for preoperative cardiovascular risk assessment. He has upcoming left carpal tunnel release and decompression ulna nerve at left elbow, to be performed 01/03/2023 by Dr. Betha Loa. Eventually to be performed on the right as well. Occasionally wakes up at night due to the pain in his hands. He states he is able to walk indoors, able to walk up 18 flights of stairs, and would be able to run a short distance. He is able to complete light to moderate housework and yard work without issues.  He confirms that he  is still on the kidney transplant list at [facility]. In the office his blood pressure is 138/79. He is tolerating dialysis and has not needed to take midodrine.  For exercise he has been walking 3-6 miles a week at the park on a 1.5 mile pathway. He will walk multiple times along the path with no anginal symptoms.  He denies any palpitations, chest pain, shortness of breath, peripheral edema, lightheadedness,  headaches, syncope, orthopnea, or PND.  ROS:  Please see the history of present illness. ROS otherwise negative except as noted.   Studies Reviewed: Marland Kitchen    EKG Interpretation Date/Time:  Friday November 23 2022 11:50:20 EDT Ventricular Rate:  100 PR Interval:  174 QRS Duration:  136 QT Interval:  416 QTC Calculation: 536 R Axis:   -58  Text Interpretation: Normal sinus rhythm Right bundle branch block Left anterior fascicular block Bifascicular block Septal infarct , age undetermined Confirmed by Jodelle Red [phone]) on 11/23/2022 12:05:35 PM    TTE  11/15/2022  ([facility]): CONCLUSIONS  1. Left ventricle  The left ventricular cavity size is normal. There is mild     concentric hypertrophy noted. Systolic function is normal by the biplane     method of disks. The ejection fraction is 60%  +--5% . Wall motion is     normal; there are no regional wall motion abnormalities. Grade 2     diastolic dysfunction with elevated left atrial pressure.  2. Right ventricle  The right ventricular cavity size is normal. Systolic     function is mildly reduced as estimated by visual and quantitative     measures.  3. Aortic valve  Aortic valve appears tricuspid with mildly thickened,     mildly calcified leaflets. There is a 0.5 X 0.4 cm echodensity attached     to the aortic valve. Vegetation- thrombus cannot be ruled out. Could also     be calcification. Clinical correlation required. There is mild to     moderate aortic regurgitation. No aortic stenosis.  4. Mitral valve  Mobility of the posterior leaflet s  is restricted. There     is no mitral stenosis. There is trace mitral regurgitation.  5. Tricuspid valve  There is mild tricuspid regurgitation.  No previous study was available for comparison.   Physical Exam:    VS:  BP 138/79 (BP Location: Left Arm, Patient Position: Sitting, Cuff Size: Normal)   Pulse 100   Ht 6\' 1"  (1.854 m)   Wt 191 lb 4.8 oz (86.8 kg)   SpO2 99%    BMI 25.24 kg/m    Wt Readings from Last 3 Encounters:  11/23/22 191 lb 4.8 oz (86.8 kg)  01/16/22 204 lb 3.2 oz (92.6 kg)  11/28/21 202 lb 6.4 oz (91.8 kg)    GEN: Well nourished, well developed in no acute distress HEENT: Normal, moist mucous membranes NECK: No JVD CARDIAC: regular rhythm, normal S1 and S2, no rubs or gallops. systolic murmur consistent with his bruit. VASCULAR: Radial and DP pulses 2+ bilaterally. No carotid bruits RESPIRATORY:  Clear to auscultation without rales, wheezing or rhonchi  ABDOMEN: Soft, non-tender, non-distended MUSCULOSKELETAL:  Ambulates independently SKIN: Warm and dry, no edema NEUROLOGIC:  Alert and oriented x 3. No focal neuro deficits noted. PSYCHIATRIC:  Normal affect   ASSESSMENT AND PLAN: .    Preoperative cardiac evaluation: According to the Revised Cardiac Risk Index (RCRI), his Perioperative Risk of Major Cardiac Event is (%): 6.6  His Functional Capacity in  METs is: 7.59 according to the [facility] Activity Status Index (DASI).  The patient is not currently having active cardiac symptoms, and they can achieve >4 METs of activity.  According to ACC/AHA Guidelines, no further testing is needed.  Proceed with surgery at acceptable risk.  Our service is available as needed in the peri-operative period.     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 We reviewed his history of CVA. Based on imaging, this was not felt to be an embolic stroke, and it was not acute. Comments note lacunar infarct and chronic microvascular changes.  -we discussed that typically indication for resection of these is embolic stroke or recurrent stroke. He does not meet this criteria. -aspirin stopped due history of GI bleed -nuclear stress test 2022 low risk, unremarkable   ESRD Dialysis-associated hypotension Was undergoing transplant evaluation at [facility] and [facility], now under evaluation with [facility] in [place] Does not  require midodrine with dialysis any more   Cardiac risk counseling and prevention recommendations: -recommend heart healthy/Mediterranean diet, with whole grains, fruits, vegetable, fish, lean meats, nuts, and olive oil. Limit salt. -recommend moderate walking, 3-5 times/week for 30-50 minutes each session. Aim for at least 150 minutes.week. Goal should be pace of 3 miles/hours, or walking 1.5 miles in 30 minutes -recommend avoidance of tobacco products. Avoid excess alcohol.  Dispo: Follow-up in 1 year, or sooner as needed.  I,Mathew Stumpf,acting as a Neurosurgeon for [employer], MD.,have documented all relevant documentation on the behalf of Jodelle Red, MD,as directed by  Jodelle Red, MD while in the presence of Jodelle Red, MD.  I, Jodelle Red, MD, have reviewed all documentation for this visit. The documentation on 11/27/22 for the exam, diagnosis, procedures, and orders are all accurate and complete.   Signed, Jodelle Red, MD

## 2022-11-26 DIAGNOSIS — R519 Headache, unspecified: Secondary | ICD-10-CM | POA: Diagnosis not present

## 2022-11-26 DIAGNOSIS — R197 Diarrhea, unspecified: Secondary | ICD-10-CM | POA: Diagnosis not present

## 2022-11-26 DIAGNOSIS — D631 Anemia in chronic kidney disease: Secondary | ICD-10-CM | POA: Diagnosis not present

## 2022-11-26 DIAGNOSIS — N186 End stage renal disease: Secondary | ICD-10-CM | POA: Diagnosis not present

## 2022-11-26 DIAGNOSIS — Z992 Dependence on renal dialysis: Secondary | ICD-10-CM | POA: Diagnosis not present

## 2022-11-26 DIAGNOSIS — E875 Hyperkalemia: Secondary | ICD-10-CM | POA: Diagnosis not present

## 2022-11-26 DIAGNOSIS — N2581 Secondary hyperparathyroidism of renal origin: Secondary | ICD-10-CM | POA: Diagnosis not present

## 2022-11-26 DIAGNOSIS — D689 Coagulation defect, unspecified: Secondary | ICD-10-CM | POA: Diagnosis not present

## 2022-11-26 DIAGNOSIS — E1129 Type 2 diabetes mellitus with other diabetic kidney complication: Secondary | ICD-10-CM | POA: Diagnosis not present

## 2022-11-27 ENCOUNTER — Ambulatory Visit: Payer: 59 | Admitting: Podiatry

## 2022-11-27 ENCOUNTER — Telehealth: Payer: Self-pay | Admitting: Cardiology

## 2022-11-27 NOTE — Telephone Encounter (Signed)
Brenda from [facility] is calling to follow up on patient's clearance. The fax number is [phone].

## 2022-11-27 NOTE — Telephone Encounter (Signed)
Will forward to the pre op APP to review if the pt has been cleared. Pt saw Dr. Cristal Deer 11/23/22, appt notes stated need pre op clearance, though I do not see that clearance was addressed in MD's ov notes.

## 2022-11-27 NOTE — Telephone Encounter (Signed)
Dr. Merrilee Seashore office is requesting verification that patient can proceed with surgery on 11/21. Any concerns?  Please route your message to p cv div preop.  Filomena Jungling

## 2022-11-28 DIAGNOSIS — Z992 Dependence on renal dialysis: Secondary | ICD-10-CM | POA: Diagnosis not present

## 2022-11-28 DIAGNOSIS — R197 Diarrhea, unspecified: Secondary | ICD-10-CM | POA: Diagnosis not present

## 2022-11-28 DIAGNOSIS — E875 Hyperkalemia: Secondary | ICD-10-CM | POA: Diagnosis not present

## 2022-11-28 DIAGNOSIS — D631 Anemia in chronic kidney disease: Secondary | ICD-10-CM | POA: Diagnosis not present

## 2022-11-28 DIAGNOSIS — N2581 Secondary hyperparathyroidism of renal origin: Secondary | ICD-10-CM | POA: Diagnosis not present

## 2022-11-28 DIAGNOSIS — D689 Coagulation defect, unspecified: Secondary | ICD-10-CM | POA: Diagnosis not present

## 2022-11-28 DIAGNOSIS — N186 End stage renal disease: Secondary | ICD-10-CM | POA: Diagnosis not present

## 2022-11-28 DIAGNOSIS — R519 Headache, unspecified: Secondary | ICD-10-CM | POA: Diagnosis not present

## 2022-11-28 DIAGNOSIS — E1129 Type 2 diabetes mellitus with other diabetic kidney complication: Secondary | ICD-10-CM | POA: Diagnosis not present

## 2022-11-30 DIAGNOSIS — E1129 Type 2 diabetes mellitus with other diabetic kidney complication: Secondary | ICD-10-CM | POA: Diagnosis not present

## 2022-11-30 DIAGNOSIS — D689 Coagulation defect, unspecified: Secondary | ICD-10-CM | POA: Diagnosis not present

## 2022-11-30 DIAGNOSIS — D631 Anemia in chronic kidney disease: Secondary | ICD-10-CM | POA: Diagnosis not present

## 2022-11-30 DIAGNOSIS — N186 End stage renal disease: Secondary | ICD-10-CM | POA: Diagnosis not present

## 2022-11-30 DIAGNOSIS — R197 Diarrhea, unspecified: Secondary | ICD-10-CM | POA: Diagnosis not present

## 2022-11-30 DIAGNOSIS — E875 Hyperkalemia: Secondary | ICD-10-CM | POA: Diagnosis not present

## 2022-11-30 DIAGNOSIS — Z992 Dependence on renal dialysis: Secondary | ICD-10-CM | POA: Diagnosis not present

## 2022-11-30 DIAGNOSIS — N2581 Secondary hyperparathyroidism of renal origin: Secondary | ICD-10-CM | POA: Diagnosis not present

## 2022-11-30 DIAGNOSIS — R519 Headache, unspecified: Secondary | ICD-10-CM | POA: Diagnosis not present

## 2022-12-03 DIAGNOSIS — R519 Headache, unspecified: Secondary | ICD-10-CM | POA: Diagnosis not present

## 2022-12-03 DIAGNOSIS — E1129 Type 2 diabetes mellitus with other diabetic kidney complication: Secondary | ICD-10-CM | POA: Diagnosis not present

## 2022-12-03 DIAGNOSIS — Z992 Dependence on renal dialysis: Secondary | ICD-10-CM | POA: Diagnosis not present

## 2022-12-03 DIAGNOSIS — D631 Anemia in chronic kidney disease: Secondary | ICD-10-CM | POA: Diagnosis not present

## 2022-12-03 DIAGNOSIS — N186 End stage renal disease: Secondary | ICD-10-CM | POA: Diagnosis not present

## 2022-12-03 DIAGNOSIS — D689 Coagulation defect, unspecified: Secondary | ICD-10-CM | POA: Diagnosis not present

## 2022-12-03 DIAGNOSIS — E875 Hyperkalemia: Secondary | ICD-10-CM | POA: Diagnosis not present

## 2022-12-03 DIAGNOSIS — N2581 Secondary hyperparathyroidism of renal origin: Secondary | ICD-10-CM | POA: Diagnosis not present

## 2022-12-03 DIAGNOSIS — R197 Diarrhea, unspecified: Secondary | ICD-10-CM | POA: Diagnosis not present

## 2022-12-04 ENCOUNTER — Encounter: Payer: 59 | Admitting: Podiatry

## 2022-12-04 NOTE — Progress Notes (Signed)
Patient was a no-show for his scheduled appointment today.

## 2022-12-05 DIAGNOSIS — N186 End stage renal disease: Secondary | ICD-10-CM | POA: Diagnosis not present

## 2022-12-05 DIAGNOSIS — D689 Coagulation defect, unspecified: Secondary | ICD-10-CM | POA: Diagnosis not present

## 2022-12-05 DIAGNOSIS — E875 Hyperkalemia: Secondary | ICD-10-CM | POA: Diagnosis not present

## 2022-12-05 DIAGNOSIS — R519 Headache, unspecified: Secondary | ICD-10-CM | POA: Diagnosis not present

## 2022-12-05 DIAGNOSIS — Z992 Dependence on renal dialysis: Secondary | ICD-10-CM | POA: Diagnosis not present

## 2022-12-05 DIAGNOSIS — D631 Anemia in chronic kidney disease: Secondary | ICD-10-CM | POA: Diagnosis not present

## 2022-12-05 DIAGNOSIS — N2581 Secondary hyperparathyroidism of renal origin: Secondary | ICD-10-CM | POA: Diagnosis not present

## 2022-12-05 DIAGNOSIS — R197 Diarrhea, unspecified: Secondary | ICD-10-CM | POA: Diagnosis not present

## 2022-12-05 DIAGNOSIS — E1129 Type 2 diabetes mellitus with other diabetic kidney complication: Secondary | ICD-10-CM | POA: Diagnosis not present

## 2022-12-06 DIAGNOSIS — H6092 Unspecified otitis externa, left ear: Secondary | ICD-10-CM | POA: Diagnosis not present

## 2022-12-06 DIAGNOSIS — H6121 Impacted cerumen, right ear: Secondary | ICD-10-CM | POA: Diagnosis not present

## 2022-12-06 DIAGNOSIS — L299 Pruritus, unspecified: Secondary | ICD-10-CM | POA: Diagnosis not present

## 2022-12-07 DIAGNOSIS — E875 Hyperkalemia: Secondary | ICD-10-CM | POA: Diagnosis not present

## 2022-12-07 DIAGNOSIS — D631 Anemia in chronic kidney disease: Secondary | ICD-10-CM | POA: Diagnosis not present

## 2022-12-07 DIAGNOSIS — N2581 Secondary hyperparathyroidism of renal origin: Secondary | ICD-10-CM | POA: Diagnosis not present

## 2022-12-07 DIAGNOSIS — Z992 Dependence on renal dialysis: Secondary | ICD-10-CM | POA: Diagnosis not present

## 2022-12-07 DIAGNOSIS — R197 Diarrhea, unspecified: Secondary | ICD-10-CM | POA: Diagnosis not present

## 2022-12-07 DIAGNOSIS — D689 Coagulation defect, unspecified: Secondary | ICD-10-CM | POA: Diagnosis not present

## 2022-12-07 DIAGNOSIS — E1129 Type 2 diabetes mellitus with other diabetic kidney complication: Secondary | ICD-10-CM | POA: Diagnosis not present

## 2022-12-07 DIAGNOSIS — R519 Headache, unspecified: Secondary | ICD-10-CM | POA: Diagnosis not present

## 2022-12-07 DIAGNOSIS — N186 End stage renal disease: Secondary | ICD-10-CM | POA: Diagnosis not present

## 2022-12-10 DIAGNOSIS — N2581 Secondary hyperparathyroidism of renal origin: Secondary | ICD-10-CM | POA: Diagnosis not present

## 2022-12-10 DIAGNOSIS — R519 Headache, unspecified: Secondary | ICD-10-CM | POA: Diagnosis not present

## 2022-12-10 DIAGNOSIS — E1129 Type 2 diabetes mellitus with other diabetic kidney complication: Secondary | ICD-10-CM | POA: Diagnosis not present

## 2022-12-10 DIAGNOSIS — E875 Hyperkalemia: Secondary | ICD-10-CM | POA: Diagnosis not present

## 2022-12-10 DIAGNOSIS — Z992 Dependence on renal dialysis: Secondary | ICD-10-CM | POA: Diagnosis not present

## 2022-12-10 DIAGNOSIS — D689 Coagulation defect, unspecified: Secondary | ICD-10-CM | POA: Diagnosis not present

## 2022-12-10 DIAGNOSIS — N186 End stage renal disease: Secondary | ICD-10-CM | POA: Diagnosis not present

## 2022-12-10 DIAGNOSIS — R197 Diarrhea, unspecified: Secondary | ICD-10-CM | POA: Diagnosis not present

## 2022-12-10 DIAGNOSIS — D631 Anemia in chronic kidney disease: Secondary | ICD-10-CM | POA: Diagnosis not present

## 2022-12-13 DIAGNOSIS — N2581 Secondary hyperparathyroidism of renal origin: Secondary | ICD-10-CM | POA: Diagnosis not present

## 2022-12-13 DIAGNOSIS — R197 Diarrhea, unspecified: Secondary | ICD-10-CM | POA: Diagnosis not present

## 2022-12-13 DIAGNOSIS — E875 Hyperkalemia: Secondary | ICD-10-CM | POA: Diagnosis not present

## 2022-12-13 DIAGNOSIS — Z992 Dependence on renal dialysis: Secondary | ICD-10-CM | POA: Diagnosis not present

## 2022-12-13 DIAGNOSIS — N186 End stage renal disease: Secondary | ICD-10-CM | POA: Diagnosis not present

## 2022-12-13 DIAGNOSIS — D631 Anemia in chronic kidney disease: Secondary | ICD-10-CM | POA: Diagnosis not present

## 2022-12-13 DIAGNOSIS — D689 Coagulation defect, unspecified: Secondary | ICD-10-CM | POA: Diagnosis not present

## 2022-12-13 DIAGNOSIS — I12 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 stage renal disease: Secondary | ICD-10-CM | POA: Diagnosis not present

## 2022-12-13 DIAGNOSIS — R519 Headache, unspecified: Secondary | ICD-10-CM | POA: Diagnosis not present

## 2022-12-13 DIAGNOSIS — E1129 Type 2 diabetes mellitus with other diabetic kidney complication: Secondary | ICD-10-CM | POA: Diagnosis not present

## 2022-12-14 DIAGNOSIS — R519 Headache, unspecified: Secondary | ICD-10-CM | POA: Diagnosis not present

## 2022-12-14 DIAGNOSIS — Z992 Dependence on renal dialysis: Secondary | ICD-10-CM | POA: Diagnosis not present

## 2022-12-14 DIAGNOSIS — D689 Coagulation defect, unspecified: Secondary | ICD-10-CM | POA: Diagnosis not present

## 2022-12-14 DIAGNOSIS — D631 Anemia in chronic kidney disease: Secondary | ICD-10-CM | POA: Diagnosis not present

## 2022-12-14 DIAGNOSIS — N186 End stage renal disease: Secondary | ICD-10-CM | POA: Diagnosis not present

## 2022-12-14 DIAGNOSIS — N2581 Secondary hyperparathyroidism of renal origin: Secondary | ICD-10-CM | POA: Diagnosis not present

## 2022-12-14 DIAGNOSIS — D509 Iron deficiency anemia, unspecified: Secondary | ICD-10-CM | POA: Diagnosis not present

## 2022-12-14 DIAGNOSIS — E875 Hyperkalemia: Secondary | ICD-10-CM | POA: Diagnosis not present

## 2022-12-16 DIAGNOSIS — N2581 Secondary hyperparathyroidism of renal origin: Secondary | ICD-10-CM | POA: Diagnosis not present

## 2022-12-16 DIAGNOSIS — D509 Iron deficiency anemia, unspecified: Secondary | ICD-10-CM | POA: Diagnosis not present

## 2022-12-16 DIAGNOSIS — E875 Hyperkalemia: Secondary | ICD-10-CM | POA: Diagnosis not present

## 2022-12-16 DIAGNOSIS — Z992 Dependence on renal dialysis: Secondary | ICD-10-CM | POA: Diagnosis not present

## 2022-12-16 DIAGNOSIS — D689 Coagulation defect, unspecified: Secondary | ICD-10-CM | POA: Diagnosis not present

## 2022-12-16 DIAGNOSIS — D631 Anemia in chronic kidney disease: Secondary | ICD-10-CM | POA: Diagnosis not present

## 2022-12-16 DIAGNOSIS — R519 Headache, unspecified: Secondary | ICD-10-CM | POA: Diagnosis not present

## 2022-12-16 DIAGNOSIS — N186 End stage renal disease: Secondary | ICD-10-CM | POA: Diagnosis not present

## 2022-12-17 DIAGNOSIS — E875 Hyperkalemia: Secondary | ICD-10-CM | POA: Diagnosis not present

## 2022-12-17 DIAGNOSIS — N186 End stage renal disease: Secondary | ICD-10-CM | POA: Diagnosis not present

## 2022-12-17 DIAGNOSIS — R519 Headache, unspecified: Secondary | ICD-10-CM | POA: Diagnosis not present

## 2022-12-17 DIAGNOSIS — D689 Coagulation defect, unspecified: Secondary | ICD-10-CM | POA: Diagnosis not present

## 2022-12-17 DIAGNOSIS — Z992 Dependence on renal dialysis: Secondary | ICD-10-CM | POA: Diagnosis not present

## 2022-12-17 DIAGNOSIS — N2581 Secondary hyperparathyroidism of renal origin: Secondary | ICD-10-CM | POA: Diagnosis not present

## 2022-12-17 DIAGNOSIS — G5603 Carpal tunnel syndrome, bilateral upper limbs: Secondary | ICD-10-CM | POA: Diagnosis not present

## 2022-12-17 DIAGNOSIS — D509 Iron deficiency anemia, unspecified: Secondary | ICD-10-CM | POA: Diagnosis not present

## 2022-12-17 DIAGNOSIS — D631 Anemia in chronic kidney disease: Secondary | ICD-10-CM | POA: Diagnosis not present

## 2022-12-17 DIAGNOSIS — M25511 Pain in right shoulder: Secondary | ICD-10-CM | POA: Diagnosis not present

## 2022-12-19 DIAGNOSIS — E875 Hyperkalemia: Secondary | ICD-10-CM | POA: Diagnosis not present

## 2022-12-19 DIAGNOSIS — N2581 Secondary hyperparathyroidism of renal origin: Secondary | ICD-10-CM | POA: Diagnosis not present

## 2022-12-19 DIAGNOSIS — D689 Coagulation defect, unspecified: Secondary | ICD-10-CM | POA: Diagnosis not present

## 2022-12-19 DIAGNOSIS — D509 Iron deficiency anemia, unspecified: Secondary | ICD-10-CM | POA: Diagnosis not present

## 2022-12-19 DIAGNOSIS — D631 Anemia in chronic kidney disease: Secondary | ICD-10-CM | POA: Diagnosis not present

## 2022-12-19 DIAGNOSIS — R519 Headache, unspecified: Secondary | ICD-10-CM | POA: Diagnosis not present

## 2022-12-19 DIAGNOSIS — Z992 Dependence on renal dialysis: Secondary | ICD-10-CM | POA: Diagnosis not present

## 2022-12-19 DIAGNOSIS — N186 End stage renal disease: Secondary | ICD-10-CM | POA: Diagnosis not present

## 2022-12-20 DIAGNOSIS — H6992 Unspecified Eustachian tube disorder, left ear: Secondary | ICD-10-CM | POA: Diagnosis not present

## 2022-12-20 DIAGNOSIS — H6121 Impacted cerumen, right ear: Secondary | ICD-10-CM | POA: Diagnosis not present

## 2022-12-20 DIAGNOSIS — H9311 Tinnitus, right ear: Secondary | ICD-10-CM | POA: Diagnosis not present

## 2022-12-21 ENCOUNTER — Ambulatory Visit (INDEPENDENT_AMBULATORY_CARE_PROVIDER_SITE_OTHER): Payer: 59 | Admitting: Physician Assistant

## 2022-12-21 ENCOUNTER — Ambulatory Visit (INDEPENDENT_AMBULATORY_CARE_PROVIDER_SITE_OTHER): Payer: 59

## 2022-12-21 ENCOUNTER — Encounter: Payer: Self-pay | Admitting: Physician Assistant

## 2022-12-21 ENCOUNTER — Encounter: Payer: Self-pay | Admitting: Sports Medicine

## 2022-12-21 ENCOUNTER — Ambulatory Visit (INDEPENDENT_AMBULATORY_CARE_PROVIDER_SITE_OTHER): Payer: 59 | Admitting: Sports Medicine

## 2022-12-21 ENCOUNTER — Other Ambulatory Visit: Payer: Self-pay

## 2022-12-21 DIAGNOSIS — M25512 Pain in left shoulder: Secondary | ICD-10-CM

## 2022-12-21 DIAGNOSIS — G8929 Other chronic pain: Secondary | ICD-10-CM

## 2022-12-21 DIAGNOSIS — M25511 Pain in right shoulder: Secondary | ICD-10-CM | POA: Diagnosis not present

## 2022-12-21 DIAGNOSIS — N186 End stage renal disease: Secondary | ICD-10-CM | POA: Diagnosis not present

## 2022-12-21 DIAGNOSIS — D631 Anemia in chronic kidney disease: Secondary | ICD-10-CM | POA: Diagnosis not present

## 2022-12-21 DIAGNOSIS — Z992 Dependence on renal dialysis: Secondary | ICD-10-CM | POA: Diagnosis not present

## 2022-12-21 DIAGNOSIS — D689 Coagulation defect, unspecified: Secondary | ICD-10-CM | POA: Diagnosis not present

## 2022-12-21 DIAGNOSIS — R519 Headache, unspecified: Secondary | ICD-10-CM | POA: Diagnosis not present

## 2022-12-21 DIAGNOSIS — E875 Hyperkalemia: Secondary | ICD-10-CM | POA: Diagnosis not present

## 2022-12-21 DIAGNOSIS — D509 Iron deficiency anemia, unspecified: Secondary | ICD-10-CM | POA: Diagnosis not present

## 2022-12-21 DIAGNOSIS — N2581 Secondary hyperparathyroidism of renal origin: Secondary | ICD-10-CM | POA: Diagnosis not present

## 2022-12-21 MED ORDER — BUPIVACAINE HCL 0.25 % IJ SOLN
2.0000 mL | INTRAMUSCULAR | Status: AC | PRN
Start: 2022-12-21 — End: 2022-12-21
  Administered 2022-12-21: 2 mL via INTRA_ARTICULAR

## 2022-12-21 MED ORDER — LIDOCAINE HCL 1 % IJ SOLN
2.0000 mL | INTRAMUSCULAR | Status: AC | PRN
Start: 2022-12-21 — End: 2022-12-21
  Administered 2022-12-21: 2 mL

## 2022-12-21 MED ORDER — METHYLPREDNISOLONE ACETATE 40 MG/ML IJ SUSP
40.0000 mg | INTRAMUSCULAR | Status: AC | PRN
  Administered 2022-12-21: 40 mg via INTRA_ARTICULAR

## 2022-12-21 MED ORDER — TRAMADOL HCL 50 MG PO TABS
50.0000 mg | ORAL_TABLET | Freq: Two times a day (BID) | ORAL | 1 refills | Status: DC | PRN
Start: 2022-12-21 — End: 2023-02-18

## 2022-12-21 NOTE — Addendum Note (Signed)
Addended by: Wendi Maya on: 12/21/2022 02:15 PM   Modules accepted: Orders

## 2022-12-21 NOTE — Progress Notes (Signed)
   Procedure Note  Patient: Carl Lindner Sr.             Date of Birth: [birth year]           MRN: [id number]             Visit Date: 12/21/2022  Procedures: Visit Diagnoses:  1. Chronic left shoulder pain    Large Joint Inj: L glenohumeral on 12/21/2022 3:00 PM Indications: pain Details: 22 G 3.5 in needle, ultrasound-guided posterior approach Medications: 2 mL lidocaine 1 %; 2 mL bupivacaine 0.25 %; 40 mg methylPREDNISolone acetate 40 MG/ML Outcome: tolerated well, no immediate complications  US-guided glenohumeral joint injection, left shoulder After discussion on risks/benefits/indications, informed verbal consent was obtained. A timeout was then performed. The patient was positioned lying lateral recumbent on examination table. The patient's shoulder was prepped with betadine and multiple alcohol swabs and utilizing ultrasound guidance, the patient's glenohumeral joint was identified on ultrasound. Using ultrasound guidance a 22-gauge, 3.5 inch needle with a mixture of 2:2:1 cc's lidocaine:bupivicaine:depomedrol was directed from a lateral to medial direction via in-plane technique into the glenohumeral joint with visualization of appropriate spread of injectate into the joint. Patient tolerated the procedure well without immediate complications.      Procedure, treatment alternatives, risks and benefits explained, specific risks discussed. Consent was given by the patient. Immediately prior to procedure a time out was called to verify the correct patient, procedure, equipment, support staff and site/side marked as required. Patient was prepped and draped in the usual sterile fashion.     - tolerated procedure well without immediate AE's - follow-up with Mikey Kirschner as indicated; I am happy to see them as needed  Madelyn Brunner, DO Primary Care Sports Medicine Physician  [facility]  This note was dictated using Dragon naturally speaking software and  may contain errors in syntax, spelling, or content which have not been identified prior to signing this note.

## 2022-12-21 NOTE — Addendum Note (Signed)
Addended by: Cristie Hem on: 12/21/2022 02:11 PM   Modules accepted: Orders

## 2022-12-21 NOTE — Progress Notes (Signed)
Office Visit Note   Patient: Carl Poirot Sr.           Date of Birth: 04-Jun-1960           MRN: [id number] Visit Date: 12/21/2022              Requested by: Darrow Bussing, MD [id number] [place],  [place] [address] PCP: Darrow Bussing, MD   Assessment & Plan: Visit Diagnoses:  1. Chronic pain of both shoulders     Plan: Impression is right shoulder pain and decreased range of motion following reverse shoulder replacement 2 years ago in addition to left shoulder pain likely from underlying glenohumeral osteoarthritis.  In regards to the right shoulder, x-rays show stable implant without complication.  I agree that she would benefit from more physical therapy for which she is scheduled to start next week.  In regards to the left shoulder, we have discussed referral to Dr. Shon Baton for ultrasound-guided cortisone injection to the glenohumeral joint.  He is agreeable to this plan.  Follow-up as needed.  Follow-Up Instructions: Return if symptoms worsen or fail to improve.   Orders:  Orders Placed This Encounter  Procedures   XR Shoulder Left   XR Shoulder Right   No orders of the defined types were placed in this encounter.     Procedures: No procedures performed   Clinical Data: No additional findings.   Subjective: Chief Complaint  Patient presents with   Right Shoulder - Pain   Left Shoulder - Pain    HPI patient is a pleasant 62 year old gentleman who comes in today with bilateral shoulder pain.  He is status post right reverse shoulder replacement by Dr. Everardo Pacific 08/24/2020.  He tells me he initially regained a fair amount of range of motion but this has since decreased.  He is also having increased pain.  The pain is primarily along the deltoid.  Worse with any motion of the shoulder.  He also tells me he is scheduled to start outpatient physical therapy next week for this.  In regards to the left shoulder, he has had pain here for a while.  All of  his pain is to the deltoid.  Worse with any movement of the shoulder.  He has used [employer] as well as a heating pad without significant relief.  No previous cortisone injection there.  Review of Systems as detailed in HPI.  All others reviewed and are negative.   Objective: Vital Signs: There were no vitals taken for this visit.  Physical Exam well-developed well-nourished gentleman in no acute distress.  Alert and oriented x 3.  Ortho Exam right shoulder exam: Approximately 50% range of motion in all planes.  Left shoulder exam: Forward flexion to approximately 170 degrees.  Abduction to about 100 degrees.  Full external rotation.  He can internally rotate to T12.  Does have pain with empty can testing.  No pain with speeds or O'Brien's testing.  Negative bearhug negative cross body adduction.  He is neurovascularly intact distally.  Specialty Comments:  No specialty comments available.  Imaging: XR Shoulder Right  Result Date: 12/21/2022 Well-seated reverse shoulder replacement without complication  XR Shoulder Left  Result Date: 12/21/2022 Moderate glenohumeral degenerative changes.  Moderate AC degenerative changes    PMFS History: Patient Active Problem List   Diagnosis Date Noted   Duodenitis    Gastritis and gastroduodenitis    Ischemic colitis (HCC)    Bright red blood per rectum  ABLA (acute blood loss anemia)    Gastric ulcer    Duodenal ulcer    Diverticulosis of colon without hemorrhage    GI bleed 09/17/2021   Acute upper GI bleeding 09/11/2021   Mucosal abnormality of esophagus    Mucosal abnormality of stomach    Mucosal abnormality of duodenum    Upper GI bleed    Left hip pain 11/16/2020   Type 2 diabetes mellitus with other specified complication (HCC) 11/14/2020   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/14/2020   Primary osteoarthritis of left hip 10/07/2020   Status post reverse total arthroplasty of right shoulder 08/24/2020    Diverticulitis 03/08/2020   ESRD (end stage renal disease) (HCC) 03/08/2020   Polyneuropathy in diseases classified elsewhere (HCC) 03/08/2020   Pure hypercholesterolemia 03/08/2020   Sacral back pain 03/08/2020   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 11/10/2019   Acute gastric ulcer with hemorrhage    Acute GI bleeding 07/31/2019   Hypotension of hemodialysis 07/31/2019   Diverticulitis large intestine 07/31/2019   Mass of soft tissue of shoulder 05/26/2019   Awaiting organ transplant status 03/18/2019   Finding of cocaine in blood 02/25/2019   Anaphylactic shock, unspecified, sequela 11/21/2018   Hypercalcemia 10/28/2018   Diarrhea, unspecified 08/08/2018   Hyperkalemia 03/12/2018   Renal cell carcinoma of left kidney (HCC) 12/24/2017   Fluid overload, unspecified 07/06/2017   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   Other specified disorders of kidney and ureter 11/22/2016   Secondary hyperparathyroidism of renal origin (HCC) 10/23/2016   Headache disorder 08/21/2016   TIA (transient ischemic attack)    Essential hypertension    Seizures (HCC)    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    Lung nodule    Diverticulitis of cecum 07/21/2015   Diverticulitis of large intestine without perforation or abscess without bleeding    Right lower quadrant pain    Obstructive sleep apnea 02/08/2015   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   Diverticulosis 07/13/2014   Lower GI bleed 07/12/2014   Refusal of blood transfusions as patient is Jehovah's Witness    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   HLD (hyperlipidemia)    PRES (posterior reversible encephalopathy syndrome)    History of lacunar cerebrovascular accident (CVA) 05/23/2014   Anemia 05/23/2014   GERD (gastroesophageal reflux disease) 12/09/2012   Gastrointestinal bleeding 11/12/2012   Melena 11/12/2012   Other psychoactive substance dependence,  uncomplicated (HCC) 10/08/2012   Iron deficiency anemia, unspecified 06/10/2012   Moderate protein-calorie malnutrition (HCC) 07/29/2009   Coagulation defect, unspecified (HCC) 08/29/2006   Type 2 diabetes mellitus with diabetic peripheral angiopathy without gangrene (HCC) 08/29/2006   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 stage renal disease (HCC) 08/02/2006   Past Medical History:  Diagnosis Date   Acute gastric ulcer with hemorrhage    Acute GI bleeding 07/31/2019   Acute pericarditis    Anemia    hx of   Anxiety    situational    Arthritis    on meds   Borderline diabetes    Cataract    bilateral sx   Chronic headaches    Depression    situational    Diverticulitis    ESRD (end stage renal disease) (HCC)     On Renal Transplant List," [facility]; MWF" (10/23/2016)   GERD (gastroesophageal reflux disease)    with certain foods   GI bleed  Hypertension    diet controlled   Parathyroid abnormality (HCC)    ectopic parathyroid gland   Presence of arteriovenous fistula for hemodialysis, primary (HCC)    RUE PER PT RLE   Refusal of blood product    NO WHOLE BLOOD PROUCTS   Renal cell carcinoma (HCC)    s/p hand assisted laparoscopic bilateral nephrectomies 11/29/17, + RCC left   Secondary hyperparathyroidism (HCC)    Seizures (HCC)    one episode in past, due to" elevated Potassium" 08/02/20- "at least 4 years ago"   Sleep apnea    doesn't use CPAP anymore since weight loss   Stroke (HCC)    no residual    Family History  Problem Relation Age of Onset   Diabetes Father    Stroke Father    Hypertension Father    Uterine cancer Mother    Lupus Sister    Stroke Sister    Hypertension Sister    Anuerysm Brother        brain   Colon cancer Neg Hx    Esophageal cancer Neg Hx    Stomach cancer Neg Hx    Pancreatic cancer Neg Hx    Liver disease Neg Hx    Colon polyps Neg Hx    Rectal cancer Neg Hx     Past Surgical  History:  Procedure Laterality Date   AV FISTULA PLACEMENT Right    right arm   BIOPSY  08/01/2019   Procedure: BIOPSY;  Surgeon: Charna Elizabeth, MD;  Location: [facility] ENDOSCOPY;  Service: Endoscopy;;   BIOPSY  11/18/2020   Procedure: BIOPSY;  Surgeon: Jenel Lucks, MD;  Location: [facility] ENDOSCOPY;  Service: Gastroenterology;;   BIOPSY  09/13/2021   Procedure: BIOPSY;  Surgeon: Lemar Lofty., MD;  Location: [facility] ENDOSCOPY;  Service: Gastroenterology;;   CATARACT EXTRACTION W/ INTRAOCULAR LENS  IMPLANT, BILATERAL     COLON SURGERY     COLONOSCOPY N/A 08/04/2015   Procedure: COLONOSCOPY;  Surgeon: Beverley Fiedler, MD;  Location: [facility] ENDOSCOPY;  Service: Endoscopy;  Laterality: N/A;   COLONOSCOPY  2017   JMP@ [facility]-good prep-mass -recall 1 yr   COLONOSCOPY N/A 09/13/2021   Procedure: COLONOSCOPY;  Surgeon: Mansouraty, Netty Starring., MD;  Location: [facility] ENDOSCOPY;  Service: Gastroenterology;  Laterality: N/A;   COLONOSCOPY WITH PROPOFOL N/A 11/25/2020   Procedure: COLONOSCOPY WITH PROPOFOL;  Surgeon: Imogene Burn, MD;  Location: [facility] ENDOSCOPY;  Service: Gastroenterology;  Laterality: N/A;   COLONOSCOPY WITH PROPOFOL N/A 09/23/2021   Procedure: COLONOSCOPY WITH PROPOFOL;  Surgeon: Shellia Cleverly, DO;  Location: [facility] ENDOSCOPY;  Service: Gastroenterology;  Laterality: N/A;   ENTEROSCOPY N/A 09/23/2021   Procedure: ENTEROSCOPY;  Surgeon: Shellia Cleverly, DO;  Location: [facility] ENDOSCOPY;  Service: Gastroenterology;  Laterality: N/A;  Will place order for video capsule study as we may opt to place it during procedure   ESOPHAGOGASTRODUODENOSCOPY N/A 08/01/2019   Procedure: ESOPHAGOGASTRODUODENOSCOPY (EGD);  Surgeon: Charna Elizabeth, MD;  Location: [facility] ENDOSCOPY;  Service: Endoscopy;  Laterality: N/A;   ESOPHAGOGASTRODUODENOSCOPY N/A 11/18/2020   Procedure: ESOPHAGOGASTRODUODENOSCOPY (EGD);  Surgeon: Jenel Lucks, MD;  Location: [facility] ENDOSCOPY;  Service: Gastroenterology;  Laterality: N/A;    ESOPHAGOGASTRODUODENOSCOPY (EGD) WITH PROPOFOL N/A 08/04/2019   Procedure: ESOPHAGOGASTRODUODENOSCOPY (EGD) WITH PROPOFOL;  Surgeon: Benancio Deeds, MD;  Location: [facility] ENDOSCOPY;  Service: Gastroenterology;  Laterality: N/A;   ESOPHAGOGASTRODUODENOSCOPY (EGD) WITH PROPOFOL N/A 09/13/2021   Procedure: ESOPHAGOGASTRODUODENOSCOPY (EGD) WITH PROPOFOL;  Surgeon: Meridee Score Netty Starring., MD;  Location: [facility] ENDOSCOPY;  Service: Gastroenterology;  Laterality: N/A;   GIVENS CAPSULE STUDY N/A 09/23/2021   Procedure: GIVENS CAPSULE STUDY;  Surgeon: Shellia Cleverly, DO;  Location: [facility] ENDOSCOPY;  Service: Gastroenterology;  Laterality: N/A;   graft left arm Left    for dialysis x 2. Removed   HOT HEMOSTASIS  11/18/2020   Procedure: HOT HEMOSTASIS (ARGON PLASMA COAGULATION/BICAP);  Surgeon: Jenel Lucks, MD;  Location: [facility] ENDOSCOPY;  Service: Gastroenterology;;   HOT HEMOSTASIS N/A 09/23/2021   Procedure: HOT HEMOSTASIS (ARGON PLASMA COAGULATION/BICAP);  Surgeon: Shellia Cleverly, DO;  Location: [facility] ENDOSCOPY;  Service: Gastroenterology;  Laterality: N/A;   INSERTION OF DIALYSIS CATHETER     Rt chest   LAPAROSCOPIC RIGHT COLECTOMY N/A 08/05/2015   Procedure: LAPAROSCOPIC RIGHT COLECTOMY- ASCENDING;  Surgeon: Almond Lint, MD;  Location: [facility] OR;  Service: General;  Laterality: N/A;   MASS EXCISION Left 05/28/2019   Procedure: EXCISION SOFT TISSUE MASS LEFT SHOULDER;  Surgeon: Darnell Level, MD;  Location: [facility] ORS;  Service: General;  Laterality: Left;   NEPHRECTOMY Bilateral    PARATHYROIDECTOMY N/A 06/12/2016   Procedure: TOTAL PARATHYROIDECTOMY WITH AUTOTRANSPLANTATION TO LEFT FOREARM;  Surgeon: Darnell Level, MD;  Location: [facility] OR;  Service: General;  Laterality: N/A;   PARATHYROIDECTOMY N/A 10/23/2016   Procedure: PARATHYROIDECTOMY;  Surgeon: Darnell Level, MD;  Location: [facility] OR;  Service: General;  Laterality: N/A;   REVERSE SHOULDER ARTHROPLASTY Right 08/24/2020   Procedure: REVERSE SHOULDER  ARTHROPLASTY;  Surgeon: Bjorn Pippin, MD;  Location: [facility] OR;  Service: Orthopedics;  Laterality: Right;   REVISON OF ARTERIOVENOUS FISTULA Right 07/16/2017   Procedure: REVISION OF ARTERIOVENOUS FISTULA  Right ARM;  Surgeon: Maeola Harman, MD;  Location: [facility] OR;  Service: Vascular;  Laterality: Right;   TOTAL HIP ARTHROPLASTY Left 11/14/2020   Procedure: LEFT TOTAL HIP ARTHROPLASTY ANTERIOR APPROACH;  Surgeon: Tarry Kos, MD;  Location: [facility] OR;  Service: Orthopedics;  Laterality: Left;   UPPER GASTROINTESTINAL ENDOSCOPY  2021   @ [facility]   Social History   Occupational History   Occupation: retired    Associate Professor: SELF EMPLOYED  Tobacco Use   Smoking status: Former    Current packs/day: 0.00    Types: Cigarettes    Quit date: 06/19/1990    Years since quitting: 32.5   Smokeless tobacco: Never   Tobacco comments:    rarely cigar  Vaping Use   Vaping status: Never Used  Substance and Sexual Activity   Alcohol use: Yes    Alcohol/week: 1.0 standard drink of alcohol    Types: 1 Cans of beer per week    Comment: occasional beer   Drug use: Not Currently    Types: Marijuana    Comment: Last use several yrs ago   Sexual activity: Not Currently

## 2022-12-24 DIAGNOSIS — N186 End stage renal disease: Secondary | ICD-10-CM | POA: Diagnosis not present

## 2022-12-24 DIAGNOSIS — D689 Coagulation defect, unspecified: Secondary | ICD-10-CM | POA: Diagnosis not present

## 2022-12-24 DIAGNOSIS — Z992 Dependence on renal dialysis: Secondary | ICD-10-CM | POA: Diagnosis not present

## 2022-12-24 DIAGNOSIS — E875 Hyperkalemia: Secondary | ICD-10-CM | POA: Diagnosis not present

## 2022-12-24 DIAGNOSIS — R519 Headache, unspecified: Secondary | ICD-10-CM | POA: Diagnosis not present

## 2022-12-24 DIAGNOSIS — N2581 Secondary hyperparathyroidism of renal origin: Secondary | ICD-10-CM | POA: Diagnosis not present

## 2022-12-24 DIAGNOSIS — D509 Iron deficiency anemia, unspecified: Secondary | ICD-10-CM | POA: Diagnosis not present

## 2022-12-24 DIAGNOSIS — D631 Anemia in chronic kidney disease: Secondary | ICD-10-CM | POA: Diagnosis not present

## 2022-12-25 NOTE — Therapy (Signed)
OUTPATIENT PHYSICAL THERAPY UPPER EXTREMITY EVALUATION   Patient Name: Carl Shamel Sr. MRN: [id number] DOB:21-Nov-1960, 62 y.o., male Today's Date: 12/26/2022  END OF SESSION:  PT End of Session - 12/26/22 1230     Visit Number 1    Date for PT Re-Evaluation 02/20/23    Authorization Type UHC dual complete    PT Start Time 1110    PT Stop Time 1142    PT Time Calculation (min) 32 min    Activity Tolerance Patient tolerated treatment well    Behavior During Therapy WFL for tasks assessed/performed             Past Medical History:  Diagnosis Date   Acute gastric ulcer with hemorrhage    Acute GI bleeding 07/31/2019   Acute pericarditis    Anemia    hx of   Anxiety    situational    Arthritis    on meds   Borderline diabetes    Cataract    bilateral sx   Chronic headaches    Depression    situational    Diverticulitis    ESRD (end stage renal disease) (HCC)     On Renal Transplant List," [facility]; MWF" (10/23/2016)   GERD (gastroesophageal reflux disease)    with certain foods   GI bleed    Hypertension    diet controlled   Parathyroid abnormality (HCC)    ectopic parathyroid gland   Presence of arteriovenous fistula for hemodialysis, primary (HCC)    RUE PER PT RLE   Refusal of blood product    NO WHOLE BLOOD PROUCTS   Renal cell carcinoma (HCC)    s/p hand assisted laparoscopic bilateral nephrectomies 11/29/17, + RCC left   Secondary hyperparathyroidism (HCC)    Seizures (HCC)    one episode in past, due to" elevated Potassium" 08/02/20- "at least 4 years ago"   Sleep apnea    doesn't use CPAP anymore since weight loss   Stroke [facility])    no residual   Past Surgical History:  Procedure Laterality Date   AV FISTULA PLACEMENT Right    right arm   BIOPSY  08/01/2019   Procedure: BIOPSY;  Surgeon: Charna Elizabeth, MD;  Location: [facility] ENDOSCOPY;  Service: Endoscopy;;   BIOPSY  11/18/2020   Procedure: BIOPSY;  Surgeon: Jenel Lucks, MD;  Location:  [facility] ENDOSCOPY;  Service: Gastroenterology;;   BIOPSY  09/13/2021   Procedure: BIOPSY;  Surgeon: Lemar Lofty., MD;  Location: [facility] ENDOSCOPY;  Service: Gastroenterology;;   CATARACT EXTRACTION W/ INTRAOCULAR LENS  IMPLANT, BILATERAL     COLON SURGERY     COLONOSCOPY N/A 08/04/2015   Procedure: COLONOSCOPY;  Surgeon: Beverley Fiedler, MD;  Location: [facility] ENDOSCOPY;  Service: Endoscopy;  Laterality: N/A;   COLONOSCOPY  2017   JMP@ [facility]-good prep-mass -recall 1 yr   COLONOSCOPY N/A 09/13/2021   Procedure: COLONOSCOPY;  Surgeon: Mansouraty, Netty Starring., MD;  Location: [facility] ENDOSCOPY;  Service: Gastroenterology;  Laterality: N/A;   COLONOSCOPY WITH PROPOFOL N/A 11/25/2020   Procedure: COLONOSCOPY WITH PROPOFOL;  Surgeon: Imogene Burn, MD;  Location: [facility] ENDOSCOPY;  Service: Gastroenterology;  Laterality: N/A;   COLONOSCOPY WITH PROPOFOL N/A 09/23/2021   Procedure: COLONOSCOPY WITH PROPOFOL;  Surgeon: Shellia Cleverly, DO;  Location: [facility] ENDOSCOPY;  Service: Gastroenterology;  Laterality: N/A;   ENTEROSCOPY N/A 09/23/2021   Procedure: ENTEROSCOPY;  Surgeon: Shellia Cleverly, DO;  Location: [facility] ENDOSCOPY;  Service: Gastroenterology;  Laterality: N/A;  Will place order for video  capsule study as we may opt to place it during procedure   ESOPHAGOGASTRODUODENOSCOPY N/A 08/01/2019   Procedure: ESOPHAGOGASTRODUODENOSCOPY (EGD);  Surgeon: Charna Elizabeth, MD;  Location: [facility] ENDOSCOPY;  Service: Endoscopy;  Laterality: N/A;   ESOPHAGOGASTRODUODENOSCOPY N/A 11/18/2020   Procedure: ESOPHAGOGASTRODUODENOSCOPY (EGD);  Surgeon: Jenel Lucks, MD;  Location: [facility] ENDOSCOPY;  Service: Gastroenterology;  Laterality: N/A;   ESOPHAGOGASTRODUODENOSCOPY (EGD) WITH PROPOFOL N/A 08/04/2019   Procedure: ESOPHAGOGASTRODUODENOSCOPY (EGD) WITH PROPOFOL;  Surgeon: Benancio Deeds, MD;  Location: [facility] ENDOSCOPY;  Service: Gastroenterology;  Laterality: N/A;   ESOPHAGOGASTRODUODENOSCOPY (EGD) WITH PROPOFOL N/A 09/13/2021    Procedure: ESOPHAGOGASTRODUODENOSCOPY (EGD) WITH PROPOFOL;  Surgeon: Meridee Score Netty Starring., MD;  Location: [facility] ENDOSCOPY;  Service: Gastroenterology;  Laterality: N/A;   GIVENS CAPSULE STUDY N/A 09/23/2021   Procedure: GIVENS CAPSULE STUDY;  Surgeon: Shellia Cleverly, DO;  Location: [facility] ENDOSCOPY;  Service: Gastroenterology;  Laterality: N/A;   graft left arm Left    for dialysis x 2. Removed   HOT HEMOSTASIS  11/18/2020   Procedure: HOT HEMOSTASIS (ARGON PLASMA COAGULATION/BICAP);  Surgeon: Jenel Lucks, MD;  Location: [facility] ENDOSCOPY;  Service: Gastroenterology;;   HOT HEMOSTASIS N/A 09/23/2021   Procedure: HOT HEMOSTASIS (ARGON PLASMA COAGULATION/BICAP);  Surgeon: Shellia Cleverly, DO;  Location: [facility] ENDOSCOPY;  Service: Gastroenterology;  Laterality: N/A;   INSERTION OF DIALYSIS CATHETER     Rt chest   LAPAROSCOPIC RIGHT COLECTOMY N/A 08/05/2015   Procedure: LAPAROSCOPIC RIGHT COLECTOMY- ASCENDING;  Surgeon: Almond Lint, MD;  Location: [facility] OR;  Service: General;  Laterality: N/A;   MASS EXCISION Left 05/28/2019   Procedure: EXCISION SOFT TISSUE MASS LEFT SHOULDER;  Surgeon: Darnell Level, MD;  Location: [facility] ORS;  Service: General;  Laterality: Left;   NEPHRECTOMY Bilateral    PARATHYROIDECTOMY N/A 06/12/2016   Procedure: TOTAL PARATHYROIDECTOMY WITH AUTOTRANSPLANTATION TO LEFT FOREARM;  Surgeon: Darnell Level, MD;  Location: [facility] OR;  Service: General;  Laterality: N/A;   PARATHYROIDECTOMY N/A 10/23/2016   Procedure: PARATHYROIDECTOMY;  Surgeon: Darnell Level, MD;  Location: [facility] OR;  Service: General;  Laterality: N/A;   REVERSE SHOULDER ARTHROPLASTY Right 08/24/2020   Procedure: REVERSE SHOULDER ARTHROPLASTY;  Surgeon: Bjorn Pippin, MD;  Location: [facility] OR;  Service: Orthopedics;  Laterality: Right;   REVISON OF ARTERIOVENOUS FISTULA Right 07/16/2017   Procedure: REVISION OF ARTERIOVENOUS FISTULA  Right ARM;  Surgeon: Maeola Harman, MD;  Location: [facility] OR;  Service: Vascular;   Laterality: Right;   TOTAL HIP ARTHROPLASTY Left 11/14/2020   Procedure: LEFT TOTAL HIP ARTHROPLASTY ANTERIOR APPROACH;  Surgeon: Tarry Kos, MD;  Location: [facility] OR;  Service: Orthopedics;  Laterality: Left;   UPPER GASTROINTESTINAL ENDOSCOPY  2021   @ [facility]   Patient Active Problem List   Diagnosis Date Noted   Duodenitis    Gastritis and gastroduodenitis    Ischemic colitis (HCC)    Bright red blood per rectum    ABLA (acute blood loss anemia)    Gastric ulcer    Duodenal ulcer    Diverticulosis of colon without hemorrhage    GI bleed 09/17/2021   Acute upper GI bleeding 09/11/2021   Mucosal abnormality of esophagus    Mucosal abnormality of stomach    Mucosal abnormality of duodenum    Upper GI bleed    Left hip pain 11/16/2020   Type 2 diabetes mellitus with other specified complication (HCC) 11/14/2020   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/14/2020   Primary osteoarthritis of left hip 10/07/2020  Status post reverse total arthroplasty of right shoulder 08/24/2020   Diverticulitis 03/08/2020   ESRD (end stage renal disease) (HCC) 03/08/2020   Polyneuropathy in diseases classified elsewhere (HCC) 03/08/2020   Pure hypercholesterolemia 03/08/2020   Sacral back pain 03/08/2020   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 11/10/2019   Acute gastric ulcer with hemorrhage    Acute GI bleeding 07/31/2019   Hypotension of hemodialysis 07/31/2019   Diverticulitis large intestine 07/31/2019   Mass of soft tissue of shoulder 05/26/2019   Awaiting organ transplant status 03/18/2019   Finding of cocaine in blood 02/25/2019   Anaphylactic shock, unspecified, sequela 11/21/2018   Hypercalcemia 10/28/2018   Diarrhea, unspecified 08/08/2018   Hyperkalemia 03/12/2018   Renal cell carcinoma of left kidney (HCC) 12/24/2017   Fluid overload, unspecified 07/06/2017   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   Other specified disorders of kidney and ureter 11/22/2016    Secondary hyperparathyroidism of renal origin (HCC) 10/23/2016   Headache disorder 08/21/2016   TIA (transient ischemic attack)    Essential hypertension    Seizures (HCC)    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    Lung nodule    Diverticulitis of cecum 07/21/2015   Diverticulitis of large intestine without perforation or abscess without bleeding    Right lower quadrant pain    Obstructive sleep apnea 02/08/2015   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   Diverticulosis 07/13/2014   Lower GI bleed 07/12/2014   Refusal of blood transfusions as patient is Jehovah's Witness    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   HLD (hyperlipidemia)    PRES (posterior reversible encephalopathy syndrome)    History of lacunar cerebrovascular accident (CVA) 05/23/2014   Anemia 05/23/2014   GERD (gastroesophageal reflux disease) 12/09/2012   Gastrointestinal bleeding 11/12/2012   Melena 11/12/2012   Other psychoactive substance dependence, uncomplicated (HCC) 10/08/2012   Iron deficiency anemia, unspecified 06/10/2012   Moderate protein-calorie malnutrition (HCC) 07/29/2009   Coagulation defect, unspecified (HCC) 08/29/2006   Type 2 diabetes mellitus with diabetic peripheral angiopathy without gangrene (HCC) 08/29/2006   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 stage renal disease (HCC) 08/02/2006    PCP: Darrow Bussing, MD  REFERRING PROVIDER: Darrow Bussing, MD  REFERRING DIAG: Diagnosis  M25.511 (ICD-10-CM) - Pain in right shoulder    THERAPY DIAG:  Muscle weakness (generalized) - Plan: PT plan of care cert/re-cert  Stiffness of right shoulder, not elsewhere classified - Plan: PT plan of care cert/re-cert  Chronic pain of right knee  Chronic right shoulder pain - Plan: PT plan of care cert/re-cert  Rationale for Evaluation and Treatment: Rehabilitation  ONSET DATE: Rt reverse  total shoulder: 08/2020, pain began 1 month ago  SUBJECTIVE:  SUBJECTIVE STATEMENT: Pt presents to PT with Rt shoulder pain that began 30 days ago.  Pt had total shoulder replacement 08/2020 [facility]) and had PT at this clinic after.  Pt reports limited use and sleep interruption due to this.  Hand dominance: Right  PERTINENT HISTORY: Sleep apnea, dialysis M/W/F, Lt total hip replacement (11/2020), Rt total shoulder (08/2020)  PAIN:  Are you having pain? Yes: NPRS scale: 6/10 Pain location: Rt shoulder and scapula into Lt scapula Pain description: aching  Aggravating factors: using the arm, driving, sleep  Relieving factors: heating pad, sometimes medication  PRECAUTIONS: Fall  RED FLAGS: None   WEIGHT BEARING RESTRICTIONS: No  FALLS:  Has patient fallen in last 6 months? No  LIVING ENVIRONMENT: Lives with: lives with their family Lives in: House/apartment  OCCUPATION: None  PLOF: Independent with basic ADLs and Leisure: walking for exercise  PATIENT GOALS: reduce shoulder pain, use Rt UE without limitation, sleep without interruption   NEXT MD VISIT: none   OBJECTIVE:  Note: Objective measures were completed at Evaluation unless otherwise noted.  DIAGNOSTIC FINDINGS:  Rt shoulder 12/21/22: Well-seated reverse shoulder replacement without complication   PATIENT SURVEYS :  02/24/22: Neldon Mc 57  COGNITION: Overall cognitive status: Within functional limits for tasks assessed     SENSATION: WFL  POSTURE: Posterior pelvic tilt, significant trunk flexion, rounded shoulders and forward head  UPPER EXTREMITY ROM:   Active ROM Right eval Left eval  Shoulder flexion 80 (145 supine) Limited by 25% throughout   Shoulder extension 55   Shoulder abduction 60 (75)    Shoulder adduction     Shoulder internal rotation Full (supine)   Shoulder external rotation 35 (supine)   Elbow flexion    Elbow extension    Wrist flexion    Wrist extension    Wrist ulnar deviation    Wrist radial deviation    Wrist pronation    Wrist supination    (Blank rows = not tested)  UPPER EXTREMITY MMT:  MMT Right eval Left eval  Shoulder flexion 3- 4-  Shoulder extension 4 4+  Shoulder abduction 3- 4-  Shoulder adduction    Shoulder internal rotation 4- 4+  Shoulder external rotation 3- 4  Middle trapezius    Lower trapezius    Elbow flexion  4+  Elbow extension    Wrist flexion    Wrist extension    Wrist ulnar deviation    Wrist radial deviation    Wrist pronation    Wrist supination    Grip strength (lbs)    (Blank rows = not tested)    JOINT MOBILITY TESTING:  WFLs   PALPATION:  Diffuse palpable tenderness over Rt posterior shoulder and proximal biceps insertion    TODAY'S TREATMENT:  DATE: 12/26/22 HEP established-see below  PATIENT EDUCATION: Education details: Access Code: [email or web address] Person educated: Patient Education method: Explanation, Demonstration, and Handouts Education comprehension: verbalized understanding and returned demonstration  HOME EXERCISE PROGRAM: Access Code: [email or web address] URL: [email or web address] Date: 12/26/2022 Prepared by: Tresa Endo  Exercises - Shoulder Flexion Wall Slide with Towel  - 1-2 x daily - 7 x weekly - 2 sets - 10 reps - Supine Shoulder Flexion with Free Weight  - 2 x daily - 7 x weekly - 2 sets - 10 reps - Supine Shoulder Flexion AAROM  - 2 x daily - 7 x weekly - 1 sets - 10 reps - 10 hold - Sidelying Shoulder External Rotation  - 1 x daily - 7 x weekly - 3 sets - 10 reps  ASSESSMENT:  CLINICAL IMPRESSION: Patient is a 62 y.o. male who was seen today for physical therapy  evaluation and treatment for chronic Rt shoulder pain. He had reverse total shoulder replacement in 2022 and had strength and functional limitations after this surgery.  Pt reports 6/10 Rt shoulder pain and limited use.  DASH is 59, indicating significant limitation.  Pt with limited functional strength against gravity with scapular elevation with use.  ROM is WFLs in supine in the Rt shoulder for post-op shoulder replacement.  Strength is limited in Rt>Lt shoulder.  Pt with complex medical history including dialysis, and multiple joint replacement surgeries.  Patient will benefit from skilled PT to address the below impairments and improve overall function.    OBJECTIVE IMPAIRMENTS: decreased activity tolerance, decreased ROM, decreased strength, increased muscle spasms, impaired flexibility, postural dysfunction, and pain.   ACTIVITY LIMITATIONS: carrying, lifting, and reach over head  PARTICIPATION LIMITATIONS: meal prep, cleaning, laundry, and driving  PERSONAL FACTORS: Past/current experiences, Time since onset of injury/illness/exacerbation, and 1-2 comorbidities: Rt shoulder replacement, dialysis   are also affecting patient's functional outcome.   REHAB POTENTIAL: Good  CLINICAL DECISION MAKING: Stable/uncomplicated  EVALUATION COMPLEXITY: Low  GOALS: Goals reviewed with patient? Yes  SHORT TERM GOALS: Target date: 01/23/2023    Be independent in initial HEP Baseline: Goal status: INITIAL  2.  Improve DASH to < or = to 48  Baseline: 59 Goal status: INITIAL  3.  Demonstrate Rt shoulder A/ROM flexion to > or = to 90 to improve reaching  Baseline:  Goal status: INITIAL  4.  Report > or = to 25% fewer sleep interruptions due to Rt shoulder pain Baseline: waking 3-4x/night  Goal status: INITIAL  LONG TERM GOALS: Target date: 02/20/2023    Be independent in advanced HEP Baseline:  Goal status: INITIAL  2.  Improve DASH to < or = to 40  Baseline: 59 Goal status:  INITIAL  3.  Report > or = to 50% fewer sleep interruptions due to Rt shoulder pain Baseline:  Goal status: INITIAL  4.   Demonstrate Rt shoulder A/ROM flexion to > or = to 105 to improve reaching Baseline: 80 Goal status: INITIAL  5.  Report > or = to 50% reduction in Rt shoulder pain with use with ADLs and self-care Baseline:  Goal status: INITIAL   PLAN: PT FREQUENCY: 2x/week  PT DURATION: 8 weeks  PLANNED INTERVENTIONS: 97110-Therapeutic exercises, 97530- Therapeutic activity, [id number]- Neuromuscular re-education, 97535- Self Care, [id number]- Manual therapy, [id number]- Aquatic Therapy, 97014- Electrical stimulation (unattended), [id number]- Electrical stimulation (manual), [id number]- Ionotophoresis 4mg /ml Dexamethasone, Patient/Family education, Dry Needling, Joint mobilization, Joint manipulation, Cryotherapy, and Moist heat  PLAN FOR NEXT SESSION: add shoulder isometrics, review HEP, arm bike,  reaching overhead  Lorrene Reid, PT 12/26/22 12:46 PM   [facility] [address] [place], [place] [address] Phone # [phone] Fax [phone]

## 2022-12-26 ENCOUNTER — Ambulatory Visit: Payer: 59 | Attending: Family Medicine

## 2022-12-26 ENCOUNTER — Other Ambulatory Visit: Payer: Self-pay

## 2022-12-26 ENCOUNTER — Telehealth: Payer: Self-pay | Admitting: Orthopaedic Surgery

## 2022-12-26 DIAGNOSIS — R519 Headache, unspecified: Secondary | ICD-10-CM | POA: Diagnosis not present

## 2022-12-26 DIAGNOSIS — E875 Hyperkalemia: Secondary | ICD-10-CM | POA: Diagnosis not present

## 2022-12-26 DIAGNOSIS — M25611 Stiffness of right shoulder, not elsewhere classified: Secondary | ICD-10-CM | POA: Diagnosis not present

## 2022-12-26 DIAGNOSIS — M6281 Muscle weakness (generalized): Secondary | ICD-10-CM | POA: Diagnosis not present

## 2022-12-26 DIAGNOSIS — G8929 Other chronic pain: Secondary | ICD-10-CM | POA: Diagnosis not present

## 2022-12-26 DIAGNOSIS — D689 Coagulation defect, unspecified: Secondary | ICD-10-CM | POA: Diagnosis not present

## 2022-12-26 DIAGNOSIS — D509 Iron deficiency anemia, unspecified: Secondary | ICD-10-CM | POA: Diagnosis not present

## 2022-12-26 DIAGNOSIS — M25511 Pain in right shoulder: Secondary | ICD-10-CM | POA: Diagnosis not present

## 2022-12-26 DIAGNOSIS — N2581 Secondary hyperparathyroidism of renal origin: Secondary | ICD-10-CM | POA: Diagnosis not present

## 2022-12-26 DIAGNOSIS — D631 Anemia in chronic kidney disease: Secondary | ICD-10-CM | POA: Diagnosis not present

## 2022-12-26 DIAGNOSIS — N186 End stage renal disease: Secondary | ICD-10-CM | POA: Diagnosis not present

## 2022-12-26 DIAGNOSIS — Z992 Dependence on renal dialysis: Secondary | ICD-10-CM | POA: Diagnosis not present

## 2022-12-26 NOTE — Telephone Encounter (Signed)
Patient called needing to know the results of his X-ray he took last Thursday. The number to contact patient is [phone].

## 2022-12-27 DIAGNOSIS — H524 Presbyopia: Secondary | ICD-10-CM | POA: Diagnosis not present

## 2022-12-27 DIAGNOSIS — R7309 Other abnormal glucose: Secondary | ICD-10-CM | POA: Diagnosis not present

## 2022-12-27 DIAGNOSIS — H17822 Peripheral opacity of cornea, left eye: Secondary | ICD-10-CM | POA: Diagnosis not present

## 2022-12-27 DIAGNOSIS — H47323 Drusen of optic disc, bilateral: Secondary | ICD-10-CM | POA: Diagnosis not present

## 2022-12-27 DIAGNOSIS — H53461 Homonymous bilateral field defects, right side: Secondary | ICD-10-CM | POA: Diagnosis not present

## 2022-12-27 NOTE — Telephone Encounter (Signed)
Hey. I called patient. He has multiple questions about treatment plan on the left shoulder. He wanted to know why the right shoulder was hurting. I recommended that he follow up with the surgeon who did the right shoulder replacement. He still has questions about the left shoulder and wants you to call him.

## 2022-12-27 NOTE — Telephone Encounter (Signed)
Arthritis in left shoulder.  Has a reverse shoulder replacement in other shoulder

## 2022-12-28 ENCOUNTER — Other Ambulatory Visit: Payer: Self-pay

## 2022-12-28 DIAGNOSIS — N2581 Secondary hyperparathyroidism of renal origin: Secondary | ICD-10-CM | POA: Diagnosis not present

## 2022-12-28 DIAGNOSIS — D631 Anemia in chronic kidney disease: Secondary | ICD-10-CM | POA: Diagnosis not present

## 2022-12-28 DIAGNOSIS — N186 End stage renal disease: Secondary | ICD-10-CM | POA: Diagnosis not present

## 2022-12-28 DIAGNOSIS — Z992 Dependence on renal dialysis: Secondary | ICD-10-CM | POA: Diagnosis not present

## 2022-12-28 DIAGNOSIS — G8929 Other chronic pain: Secondary | ICD-10-CM

## 2022-12-28 DIAGNOSIS — E875 Hyperkalemia: Secondary | ICD-10-CM | POA: Diagnosis not present

## 2022-12-28 DIAGNOSIS — D509 Iron deficiency anemia, unspecified: Secondary | ICD-10-CM | POA: Diagnosis not present

## 2022-12-28 DIAGNOSIS — R519 Headache, unspecified: Secondary | ICD-10-CM | POA: Diagnosis not present

## 2022-12-28 DIAGNOSIS — D689 Coagulation defect, unspecified: Secondary | ICD-10-CM | POA: Diagnosis not present

## 2022-12-28 NOTE — Telephone Encounter (Signed)
Spoke to patient and again went over xrays we went over while in the office.  He wants to f/u with dr. August Saucer for his right shoulder (hx reverse shoulder replacement with varkey) as he does not want to see the surgeon that did the surgery.  Can you guys call and schedule him with dr. August Saucer?

## 2022-12-31 DIAGNOSIS — Z992 Dependence on renal dialysis: Secondary | ICD-10-CM | POA: Diagnosis not present

## 2022-12-31 DIAGNOSIS — N186 End stage renal disease: Secondary | ICD-10-CM | POA: Diagnosis not present

## 2022-12-31 DIAGNOSIS — E875 Hyperkalemia: Secondary | ICD-10-CM | POA: Diagnosis not present

## 2022-12-31 DIAGNOSIS — D509 Iron deficiency anemia, unspecified: Secondary | ICD-10-CM | POA: Diagnosis not present

## 2022-12-31 DIAGNOSIS — D631 Anemia in chronic kidney disease: Secondary | ICD-10-CM | POA: Diagnosis not present

## 2022-12-31 DIAGNOSIS — N2581 Secondary hyperparathyroidism of renal origin: Secondary | ICD-10-CM | POA: Diagnosis not present

## 2022-12-31 DIAGNOSIS — R519 Headache, unspecified: Secondary | ICD-10-CM | POA: Diagnosis not present

## 2022-12-31 DIAGNOSIS — D689 Coagulation defect, unspecified: Secondary | ICD-10-CM | POA: Diagnosis not present

## 2023-01-01 NOTE — Progress Notes (Signed)
PCP - Darrow Bussing, MD Cardiologist - Dr. Jodelle Red Neurologist- Marvel Plan, MD   PPM/ICD - denies   Chest x-ray - 03/01/22 EKG - 11/23/22 Stress Test - 10/20/20 ECHO - 11/15/22- [facility] Cardiac Cath - denies  CPAP - denies  DM- denies  ASA/Blood Thinner Instructions: n/a   ERAS Protcol - clears until 0430  COVID TEST- n/a  Anesthesia review: yes, cardiac hx. Revonda Standard wrote a note on pt in 2022  Patient verbally denies any shortness of breath, fever, cough and chest pain during phone call   -------------  SDW INSTRUCTIONS given:  Your procedure is scheduled on 11/21.  Report to [facility] Main Entrance "A" at 0530 A.M., and check in at the Admitting office.  Call this number if you have problems the morning of surgery:  [phone]   Remember:  Do not eat after midnight the night before your surgery  You may drink clear liquids until 0430 AM the morning of your surgery.   Clear liquids allowed are: Water, Non-Citrus Juices (without pulp), Carbonated Beverages, Clear Tea, Black Coffee Only, and Gatorade    Take these medicines the morning of surgery with A SIP OF WATER  Tylenol PRN Lipitor Opticrom PRN Gabapentin Percocet/Roxicet PRN Protonix Ultram PRN  As of today, STOP taking any Aspirin (unless otherwise instructed by your surgeon) Aleve, Naproxen, Ibuprofen, Motrin, Advil, Goody's, BC's, all herbal medications, fish oil, and all vitamins.                      Do not wear jewelry, make up, or nail polish            Do not wear lotions, powders, perfumes/colognes, or deodorant.            Do not shave 48 hours prior to surgery.  Men may shave face and neck.            Do not bring valuables to the hospital.            [facility] is not responsible for any belongings or valuables.  Do NOT Smoke (Tobacco/Vaping) 24 hours prior to your procedure If you use a CPAP at night, you may bring all equipment for your overnight stay.   Contacts, glasses,  dentures or bridgework may not be worn into surgery.      For patients admitted to the hospital, discharge time will be determined by your treatment team.   Patients discharged the day of surgery will not be allowed to drive home, and someone needs to stay with them for 24 hours.    Special instructions:   [place]- Preparing For Surgery  Before surgery, you can play an important role. Because skin is not sterile, your skin needs to be as free of germs as possible. You can reduce the number of germs on your skin by washing with CHG (chlorahexidine gluconate) Soap before surgery.  CHG is an antiseptic cleaner which kills germs and bonds with the skin to continue killing germs even after washing.    Oral Hygiene is also important to reduce your risk of infection.  Remember - BRUSH YOUR TEETH THE MORNING OF SURGERY WITH YOUR REGULAR TOOTHPASTE  Please do not use if you have an allergy to CHG or antibacterial soaps. If your skin becomes reddened/irritated stop using the CHG.  Do not shave (including legs and underarms) for at least 48 hours prior to first CHG shower. It is OK to shave your face.  Please  follow these instructions carefully.   Shower the NIGHT BEFORE SURGERY and the MORNING OF SURGERY with DIAL Soap.   Pat yourself dry with a CLEAN TOWEL.  Wear CLEAN PAJAMAS to bed the night before surgery  Place CLEAN SHEETS on your bed the night of your first shower and DO NOT SLEEP WITH PETS.   Day of Surgery: Please shower morning of surgery  Wear Clean/Comfortable clothing the morning of surgery Do not apply any deodorants/lotions.   Remember to brush your teeth WITH YOUR REGULAR TOOTHPASTE.   Questions were answered. Patient verbalized understanding of instructions.

## 2023-01-02 ENCOUNTER — Other Ambulatory Visit: Payer: Self-pay

## 2023-01-02 ENCOUNTER — Encounter (HOSPITAL_COMMUNITY): Payer: Self-pay | Admitting: Vascular Surgery

## 2023-01-02 ENCOUNTER — Encounter (HOSPITAL_COMMUNITY): Payer: Self-pay | Admitting: Orthopedic Surgery

## 2023-01-02 ENCOUNTER — Ambulatory Visit: Payer: 59 | Admitting: Rehabilitative and Restorative Service Providers"

## 2023-01-02 ENCOUNTER — Encounter: Payer: Self-pay | Admitting: Rehabilitative and Restorative Service Providers"

## 2023-01-02 DIAGNOSIS — D689 Coagulation defect, unspecified: Secondary | ICD-10-CM | POA: Diagnosis not present

## 2023-01-02 DIAGNOSIS — M25611 Stiffness of right shoulder, not elsewhere classified: Secondary | ICD-10-CM | POA: Diagnosis not present

## 2023-01-02 DIAGNOSIS — R519 Headache, unspecified: Secondary | ICD-10-CM | POA: Diagnosis not present

## 2023-01-02 DIAGNOSIS — D509 Iron deficiency anemia, unspecified: Secondary | ICD-10-CM | POA: Diagnosis not present

## 2023-01-02 DIAGNOSIS — E875 Hyperkalemia: Secondary | ICD-10-CM | POA: Diagnosis not present

## 2023-01-02 DIAGNOSIS — M6281 Muscle weakness (generalized): Secondary | ICD-10-CM

## 2023-01-02 DIAGNOSIS — G8929 Other chronic pain: Secondary | ICD-10-CM | POA: Diagnosis not present

## 2023-01-02 DIAGNOSIS — N186 End stage renal disease: Secondary | ICD-10-CM | POA: Diagnosis not present

## 2023-01-02 DIAGNOSIS — Z992 Dependence on renal dialysis: Secondary | ICD-10-CM | POA: Diagnosis not present

## 2023-01-02 DIAGNOSIS — M25511 Pain in right shoulder: Secondary | ICD-10-CM | POA: Diagnosis not present

## 2023-01-02 DIAGNOSIS — N2581 Secondary hyperparathyroidism of renal origin: Secondary | ICD-10-CM | POA: Diagnosis not present

## 2023-01-02 DIAGNOSIS — D631 Anemia in chronic kidney disease: Secondary | ICD-10-CM | POA: Diagnosis not present

## 2023-01-02 NOTE — Anesthesia Preprocedure Evaluation (Addendum)
Anesthesia Evaluation    Reviewed: Allergy & Precautions, Patient's Chart, lab work & pertinent test results  History of Anesthesia Complications Negative for: history of anesthetic complications  Airway        Dental   Pulmonary neg pulmonary ROS, former smoker          Cardiovascular hypertension, + Peripheral Vascular Disease       Neuro/Psych  Headaches, Seizures -,  PSYCHIATRIC DISORDERS Anxiety Depression    CVA    GI/Hepatic Neg liver ROS, PUD,GERD  ,,  Endo/Other  diabetes, Type 2    Renal/GU ESRF and DialysisRenal disease Hx RCC      Musculoskeletal  (+) Arthritis ,    Abdominal   Peds  Hematology  (+) REFUSES BLOOD PRODUCTS, JEHOVAH'S WITNESS  Anesthesia Other Findings   Reproductive/Obstetrics                             Anesthesia Physical Anesthesia Plan  ASA: 3  Anesthesia Plan: General   Post-op Pain Management: Tylenol PO (pre-op)*   Induction: Intravenous  PONV Risk Score and Plan: 2 and Treatment may vary due to age or medical condition, Ondansetron, Dexamethasone and Midazolam  Airway Management Planned: LMA  Additional Equipment: None  Intra-op Plan:   Post-operative Plan: Extubation in OR  Informed Consent:   Plan Discussed with: CRNA and Anesthesiologist  Anesthesia Plan Comments: (PAT note written 01/02/2023 by Shonna Chock, PA-C.  )       Anesthesia Quick Evaluation

## 2023-01-02 NOTE — Progress Notes (Signed)
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), pericarditis (09/2015, s/p colchicine), CVA (05/2014), anemia, GERD, GI bleed (likely diverticular bleed 06/2014, near perforated cecal mass 07/2015; gastric ulcer 07/2019; 09/2021 with non-bleeding gastric & duodenal ulcers, segmental colitis, agreed to IV iron & 2 units PRBC for HGB 5.0), cecal mass (s/p laparoscopic right hemicolectomy 08/05/15, pathology, inflammatory mass with ulceration/adhesions, no malignancy), seizure (11/2015, likely related to electrolyte abnormalities from HD non-compliance), chronic headaches, OSA (severe OSA 06/23/12; reported no CPAP after weight loss), left shoulder mass (s/p excision 05/28/19, + lipoma), osteoarthritis (right reverse shoulder arthroplasty 08/24/20;left THA 11/14/20). He is a Scientist, product/process development.    Last cardiology evaluation by Dr. Cristal Deer was on 11/22/21 for routine follow-up and preoperative evaluation. 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 It was not felt to be the source  of his CVA, but did not think he met criteria of for resection. He was active and tolerating hemodialysis. Not on ASA due to recurrent GI bleed. Lowe ris stress test in 2022. She noted upcoming left carpal tunnel release and decompression ulna nerve at left elbow with plans for future surgery on the right. She wrote: "Preoperative cardiac evaluation: According to the Revised Cardiac Risk Index (RCRI), his Perioperative Risk of Major Cardiac Event is (%): 6.6   His Functional Capacity in METs is: 7.59 according to the Duke Activity Status Index (DASI).   The patient is not currently having active cardiac symptoms, and they can achieve >4 METs of activity.   According to ACC/AHA Guidelines, no further testing is needed.  Proceed with surgery at acceptable risk.  Our service is available as needed in the peri-operative period."  Anesthesia team to evaluate on the day of surgery.   VS:  Wt Readings from Last 3 Encounters:  11/23/22 86.8 kg  01/16/22 92.6 kg  11/28/21 91.8 kg   BP Readings from Last 3 Encounters:  11/23/22 138/79  01/16/22 113/60  11/28/21 90/62   Pulse Readings from Last 3 Encounters:  11/23/22 100  01/16/22 (!) 109  11/28/21 100    PROVIDERS: Darrow Bussing, MD is PCP - Marvel Plan, MD (CVA) and Jacquelyne Balint, MD (hand paresthesias) are neurologists  - - Erick Blinks, MD is GI - Jodelle Red, MD is cardiologist - Nephrologist is with [facility]   LABS: For labs on arrival.    IMAGES: CXR 03/01/22 ([facility]): IMPRESSION: Small linear scarring or atelectasis at the left lower lobe, otherwise no acute process.    OTHER:  EEG 12/02/15: Impression: This awake and asleep EEG is within normal limits. Clinical Correlation: A normal  EEG does not exclude a clinical diagnosis of epilepsy. Clinical correlation is advised.    EKG: 11/23/22: Normal sinus rhythm Right bundle branch block Left anterior fascicular block Bifascicular  block Septal infarct , age undetermined Confirmed by Jodelle Red [phone]) on 11/23/2022 12:05:35 PM    CV: Echo 11/15/22 ([facility]): CONCLUSIONS  1. Left ventricle  The left ventricular cavity size is normal. There is mild     concentric hypertrophy noted. Systolic function is normal by the biplane     method of disks. The ejection fraction is 60%  +--5% . Wall motion is     normal; there are no regional wall motion abnormalities. Grade 2     diastolic dysfunction with elevated left atrial pressure.  2. Right ventricle  The right ventricular cavity size is normal. Systolic     function is mildly reduced as estimated by visual and quantitative     measures.  3. Aortic valve  Aortic valve appears tricuspid with mildly thickened,     mildly calcified leaflets. There is a 0.5 X 0.4 cm echodensity attached     to the aortic valve. Vegetation- thrombus cannot be ruled out. Could also     be calcification. Clinical correlation required. There is mild to     moderate aortic regurgitation. No aortic stenosis.  4. Mitral valve  Mobility of the posterior leaflet s  is restricted. There     is no mitral stenosis. There is trace mitral regurgitation.  5. Tricuspid valve  There is mild tricuspid regurgitation.  No previous study was available for comparison.  - Comparison echo 09/29/19 at [facility]: LVEF 55-60%, no segmental wall motion abnormalities, AV is trileaflet and opens well with a 1.5 to 2 cm thin, filamentous, hypermobile echodensity  noted on the ventricular side of the aortic valve, likely representing lambl's excrescence, estimated RVSP 20 mmHg, compared to prior study, probably no significant change, filamentous  structure visualized on aortic valve also visible in 2020 and 2018 (Did not meet criteria for excision of likely Lambl's excrescence as of 11/10/19 visit with cardiologist Dr. Cristal Deer.)    Nuclear stress test 10/20/20:   The study is normal. The study is low risk.    No ST deviation was noted.   LV perfusion is normal. There is no evidence of ischemia. There is no evidence of infarction.   Left ventricular function is normal. Nuclear stress EF: 58 %. The left ventricular ejection fraction is normal (55-65%).   Prior study not available for comparison.  - Reduced counts were noted in the inferior segments with normal wall motion consistent with diaphragm attenuation.  No evidence of ischemia or prior infarction.  This is a low risk study.     Carotid Doppler Studies 04/08/18 (as outlined 04/22/18 [facility] Transplant Eval): - Right: 21-39% diameter reduction of the right bulb. No evidence of hemodynamically significant internal carotid artery stenosis. The right vertebral artery flow is antegrade. - Left: 21-39% diameter reduction of the left bulb. No evidence of hemodynamically  significant internal carotid artery stenosis. The left vertebral artery flow is antegrade.    Iliac Doppler Studies 04/08/18 (as outlined 04/22/18 [facility] Transplant Eval): - Right: No evidence of critical stenosis of the right iliac artery. Patent right iliac vein. - Left: No evidence of hemodynamically significant stenosis of the left iliac artery. Patent left iliac vein.      Past Medical History:  Diagnosis Date   Acute gastric ulcer with hemorrhage    Acute GI bleeding  07/31/2019   Acute pericarditis    Anemia    hx of   Anxiety    situational    Arthritis    on meds   Borderline diabetes    Cataract    bilateral sx   Chronic headaches    Depression    situational    Diverticulitis    ESRD (end stage renal disease) (HCC)     On Renal Transplant List," [facility]; MWF" (10/23/2016)   GERD (gastroesophageal reflux disease)    with certain foods   GI bleed    Hypertension    diet controlled   Parathyroid abnormality (HCC)    ectopic parathyroid gland   Presence of arteriovenous fistula for hemodialysis, primary (HCC)    RUE PER PT RLE   Refusal of blood product    NO  WHOLE BLOOD PROUCTS   Renal cell carcinoma (HCC)    s/p hand assisted laparoscopic bilateral nephrectomies 11/29/17, + RCC left   Secondary hyperparathyroidism (HCC)    Seizures (HCC)    one episode in past, due to" elevated Potassium" 08/02/20- "at least 4 years ago"   Sleep apnea    doesn't use CPAP anymore since weight loss   Stroke [facility])    no residual    Past Surgical History:  Procedure Laterality Date   AV FISTULA PLACEMENT Right    right arm   BIOPSY  08/01/2019   Procedure: BIOPSY;  Surgeon: Charna Elizabeth, MD;  Location: [facility] ENDOSCOPY;  Service: Endoscopy;;   BIOPSY  11/18/2020   Procedure: BIOPSY;  Surgeon: Jenel Lucks, MD;  Location: [facility] ENDOSCOPY;  Service: Gastroenterology;;   BIOPSY  09/13/2021   Procedure: BIOPSY;  Surgeon: Lemar Lofty., MD;  Location: [facility] ENDOSCOPY;  Service: Gastroenterology;;   CATARACT EXTRACTION W/ INTRAOCULAR LENS  IMPLANT, BILATERAL     COLON SURGERY     COLONOSCOPY N/A 08/04/2015   Procedure: COLONOSCOPY;  Surgeon: Beverley Fiedler, MD;  Location: [facility] ENDOSCOPY;  Service: Endoscopy;  Laterality: N/A;   COLONOSCOPY  2017   JMP@ [facility]-good prep-mass -recall 1 yr   COLONOSCOPY N/A 09/13/2021   Procedure: COLONOSCOPY;  Surgeon: Mansouraty, Netty Starring., MD;  Location: [facility] ENDOSCOPY;  Service: Gastroenterology;  Laterality: N/A;   COLONOSCOPY WITH PROPOFOL N/A 11/25/2020   Procedure: COLONOSCOPY WITH PROPOFOL;  Surgeon: Imogene Burn, MD;  Location: [facility] ENDOSCOPY;  Service: Gastroenterology;  Laterality: N/A;   COLONOSCOPY WITH PROPOFOL N/A 09/23/2021   Procedure: COLONOSCOPY WITH PROPOFOL;  Surgeon: Shellia Cleverly, DO;  Location[facility] ENDOS[facility];  Service: Gastroenterology;  Laterality: N/A;   ENTEROSCOPY N/A 09/23/2021   Procedure: ENTEROSCOPY;  Surgeon: Shellia Cleverly, DO;  Location[facility] ENDOSCOPY;  Service: Gastroenterology;  Laterality: N/A;  Will place order for video capsule study as we may opt to place it during procedure    ESOPHAGOGASTRODUODENOSCOPY N/A 08/01/2019   Procedure: ESOPHAGOGASTRODUODENOSCOPY (EGD);  Surgeon: Charna Elizabeth, MD;  Location: [facility] ENDOSCOPY;  Service: Endoscopy;  Laterality: N/A;   ESOPHAGOGASTRODUODENOSCOPY N/A 11/18/2020   Procedure: ESOPHAGOGASTRODUODENOSCOPY (EGD);  Surgeon: Jenel Lucks, MD;  Location: [facility] ENDOSCOPY;  Service: Gastroenterology;  Laterality: N/A;   ESOPHAGOGASTRODUODENOSCOPY (EGD) WITH PROPOFOL N/A 08/04/2019   Procedure: ESOPHAGOGASTRODUODENOSCOPY (EGD) WITH PROPOFOL;  Surgeon: Benancio Deeds, MD;  Location: [facility] ENDOSCOPY;  Service: Gastroenterology;  Laterality: N/A;   ESOPHAGOGASTRODUODENOSCOPY (EGD) WITH PROPOFOL N/A 09/13/2021   Procedure: ESOPHAGOGASTRODUODENOSCOPY (EGD) WITH PROPOFOL;  Surgeon: Meridee Score Netty Starring., MD;  Location: [facility] ENDOSCOPY;  Service: Gastroenterology;  Laterality: N/A;   GIVENS CAPSULE  STUDY N/A 09/23/2021   Procedure: GIVENS CAPSULE STUDY;  Surgeon: Shellia Cleverly, DO;  Location[facility] ENDOS[facility];  Service: Gastroenterology;  Laterality: N/A;   graft left arm Left    for dialysis x 2. Removed   HOT HEMOSTASIS  11/18/2020   Procedure: HOT HEMOSTASIS (ARGON PLASMA COAGULATION/BICAP);  Surgeon: Jenel Lucks, MD;  Location: [facility] ENDOSCOPY;  Service: Gastroenterology;;   HOT HEMOSTASIS N/A 09/23/2021   Procedure: HOT HEMOSTASIS (ARGON PLASMA COAGULATION/BICAP);  Surgeon: Shellia Cleverly, DO;  Location: [facility] ENDOSCOPY;  Service: Gastroenterology;  Laterality: N/A;   INSERTION OF DIALYSIS CATHETER     Rt chest   LAPAROSCOPIC RIGHT COLECTOMY N/A 08/05/2015   Procedure: LAPAROSCOPIC RIGHT COLECTOMY- ASCENDING;  Surgeon: Almond Lint, MD;  Location: [facility] OR;  Service: General;  Laterality: N/A;   MASS EXCISION Left 05/28/2019   Procedure: EXCISION SOFT TISSUE MASS LEFT SHOULDER;  Surgeon: Darnell Level, MD;  Location: [facility] ORS;  Service: General;  Laterality: Left;   NEPHRECTOMY Bilateral    PARATHYROIDECTOMY N/A 06/12/2016   Procedure: TOTAL  PARATHYROIDECTOMY WITH AUTOTRANSPLANTATION TO LEFT FOREARM;  Surgeon: Darnell Level, MD;  Location[facility] OR;  Service: General;  Laterality: N/A;   PARATHYROIDECTOMY N/A 10/23/2016   Procedure: PARATHYROIDECTOMY;  Surgeon: Darnell Level, MD;  Location: [facility] OR;  Service: General;  Laterality: N/A;   REVERSE SHOULDER ARTHROPLASTY Right 08/24/2020   Procedure: REVERSE SHOULDER ARTHROPLASTY;  Surgeon: Bjorn Pippin, MD;  Location: [facility] OR;  Service: Orthopedics;  Laterality: Right;   REVISON OF ARTERIOVENOUS FISTULA Right 07/16/2017   Procedure: REVISION OF ARTERIOVENOUS FISTULA  Right ARM;  Surgeon: Maeola Harman, MD;  Location: [facility] OR;  Service: Vascular;  Laterality: Right;   TOTAL HIP ARTHROPLASTY Left 11/14/2020   Procedure: LEFT TOTAL HIP ARTHROPLASTY ANTERIOR APPROACH;  Surgeon: Tarry Kos, MD;  Location: [facility] OR;  Service: Orthopedics;  Laterality: Left;   UPPER GASTROINTESTINAL ENDOSCOPY  2021   @ [facility]    MEDICATIONS: No current facility-administered medications for this encounter.    acetaminophen (TYLENOL) 650 MG CR tablet   amoxicillin (AMOXIL) 500 MG capsule   atorvastatin (LIPITOR) 10 MG tablet   augmented betamethasone dipropionate (DIPROLENE-AF) 0.05 % ointment   cinacalcet (SENSIPAR) 60 MG tablet   ciprofloxacin-dexamethasone (CIPRODEX) OTIC suspension   cromolyn (OPTICROM) 4 % ophthalmic solution   Docusate Sodium (DSS) 100 MG CAPS   folic acid (FOLVITE) 1 MG tablet   gabapentin (NEURONTIN) 100 MG capsule   LOKELMA 10 g PACK packet   multivitamin (RENA-VIT) TABS tablet   oxyCODONE-acetaminophen (PERCOCET/ROXICET) 5-325 MG tablet   pantoprazole (PROTONIX) 40 MG tablet   sevelamer carbonate (RENVELA) 800 MG tablet   sucralfate (CARAFATE) 1 GM/10ML suspension   traMADol (ULTRAM) 50 MG tablet   vitamin B-12 (CYANOCOBALAMIN) 1000 MCG tablet   vitamin E 180 MG (400 UNITS) capsule    Shonna Chock, PA-C Surgical Short Stay/Anesthesiology [facility] Phone [phone] [facility]  Phone [phone] 01/02/2023 1:15 PM

## 2023-01-02 NOTE — Therapy (Signed)
OUTPATIENT PHYSICAL THERAPY UPPER EXTREMITY EVALUATION   Patient Name: [name]. MRN: [id number] DOB:02-24-1960, 62 y.o., male Today's Date: 01/02/2023  END OF SESSION:  PT End of Session - 01/02/23 1107     Visit Number 2    Date for PT Re-Evaluation 02/20/23    Authorization Type [employer] dual complete    PT Start Time 1100    PT Stop Time 1140    PT Time Calculation (min) 40 min    Activity Tolerance Patient tolerated treatment well    Behavior During Therapy WFL for tasks assessed/performed             Past Medical History:  Diagnosis Date   Acute gastric ulcer with hemorrhage    Acute GI bleeding 07/31/2019   Acute pericarditis    Anemia    hx of   Anxiety    situational    Arthritis    on meds   Borderline diabetes    Cataract    bilateral sx   Chronic headaches    Depression    situational    Diverticulitis    ESRD (end stage renal disease) (HCC)     On Renal Transplant List," [facility]; MWF" (10/23/2016)   GERD (gastroesophageal reflux disease)    with certain foods   GI bleed    Hypertension    diet controlled   Parathyroid abnormality (HCC)    ectopic parathyroid gland   Presence of arteriovenous fistula for hemodialysis, primary (HCC)    RUE PER PT RLE   Refusal of blood product    NO WHOLE BLOOD PROUCTS   Renal cell carcinoma (HCC)    s/p hand assisted laparoscopic bilateral nephrectomies 11/29/17, + RCC left   Secondary hyperparathyroidism (HCC)    Seizures (HCC)    one episode in past, due to" elevated Potassium" 08/02/20- "at least 4 years ago"   Sleep apnea    doesn't use CPAP anymore since weight loss   Stroke [facility])    no residual   Past Surgical History:  Procedure Laterality Date   AV FISTULA PLACEMENT Right    right arm   BIOPSY  08/01/2019   Procedure: BIOPSY;  Surgeon: Charna Elizabeth, MD;  Location: [facility] ENDOSCOPY;  Service: Endoscopy;;   BIOPSY  11/18/2020   Procedure: BIOPSY;  Surgeon: Jenel Lucks, MD;  Location:  [facility] ENDOSCOPY;  Service: Gastroenterology;;   BIOPSY  09/13/2021   Procedure: BIOPSY;  Surgeon: Lemar Lofty., MD;  Location: [facility] ENDOSCOPY;  Service: Gastroenterology;;   CATARACT EXTRACTION W/ INTRAOCULAR LENS  IMPLANT, BILATERAL     COLON SURGERY     COLONOSCOPY N/A 08/04/2015   Procedure: COLONOSCOPY;  Surgeon: Beverley Fiedler, MD;  Location: [facility] ENDOSCOPY;  Service: Endoscopy;  Laterality: N/A;   COLONOSCOPY  2017   JMP@ [facility]-good prep-mass -recall 1 yr   COLONOSCOPY N/A 09/13/2021   Procedure: COLONOSCOPY;  Surgeon: Mansouraty, Netty Starring., MD;  Location: [facility] ENDOSCOPY;  Service: Gastroenterology;  Laterality: N/A;   COLONOSCOPY WITH PROPOFOL N/A 11/25/2020   Procedure: COLONOSCOPY WITH PROPOFOL;  Surgeon: Imogene Burn, MD;  Location: [facility] ENDOSCOPY;  Service: Gastroenterology;  Laterality: N/A;   COLONOSCOPY WITH PROPOFOL N/A 09/23/2021   Procedure: COLONOSCOPY WITH PROPOFOL;  Surgeon: Shellia Cleverly, DO;  Location: [facility] ENDOSCOPY;  Service: Gastroenterology;  Laterality: N/A;   ENTEROSCOPY N/A 09/23/2021   Procedure: ENTEROSCOPY;  Surgeon: Shellia Cleverly, DO;  Location: [facility] ENDOSCOPY;  Service: Gastroenterology;  Laterality: N/A;  Will place order for video  capsule study as we may opt to place it during procedure   ESOPHAGOGASTRODUODENOSCOPY N/A 08/01/2019   Procedure: ESOPHAGOGASTRODUODENOSCOPY (EGD);  Surgeon: Charna Elizabeth, MD;  Location[facility] ENDOSCOPY;  Service: Endoscopy;  Laterality: N/A;   ESOPHAGOGASTRODUODENOSCOPY N/A 11/18/2020   Procedure: ESOPHAGOGASTRODUODENOSCOPY (EGD);  Surgeon: Jenel Lucks, MD;  Location: [facility] ENDOSCOPY;  Service: Gastroenterology;  Laterality: N/A;   ESOPHAGOGASTRODUODENOSCOPY (EGD) WITH PROPOFOL N/A 08/04/2019   Procedure: ESOPHAGOGASTRODUODENOSCOPY (EGD) WITH PROPOFOL;  Surgeon: Benancio Deeds, MD;  Location: [facility] ENDOSCOPY;  Service: Gastroenterology;  Laterality: N/A;   ESOPHAGOGASTRODUODENOSCOPY (EGD) WITH PROPOFOL N/A 09/13/2021    Procedure: ESOPHAGOGASTRODUODENOSCOPY (EGD) WITH PROPOFOL;  Surgeon: Meridee Score Netty Starring., MD;  Location: [facility] ENDOSCOPY;  Service: Gastroenterology;  Laterality: N/A;   GIVENS CAPSULE STUDY N/A 09/23/2021   Procedure: GIVENS CAPSULE STUDY;  Surgeon: Shellia Cleverly, DO;  Location: [facility] ENDOSCOPY;  Service: Gastroenterology;  Laterality: N/A;   graft left arm Left    for dialysis x 2. Removed   HOT HEMOSTASIS  11/18/2020   Procedure: HOT HEMOSTASIS (ARGON PLASMA COAGULATION/BICAP);  Surgeon: Jenel Lucks, MD;  Location: [facility] ENDOSCOPY;  Service: Gastroenterology;;   HOT HEMOSTASIS N/A 09/23/2021   Procedure: HOT HEMOSTASIS (ARGON PLASMA COAGULATION/BICAP);  Surgeon: Shellia Cleverly, DO;  Location: [facility] ENDOSCOPY;  Service: Gastroenterology;  Laterality: N/A;   INSERTION OF DIALYSIS CATHETER     Rt chest   LAPAROSCOPIC RIGHT COLECTOMY N/A 08/05/2015   Procedure: LAPAROSCOPIC RIGHT COLECTOMY- ASCENDING;  Surgeon: Almond Lint, MD;  Location: [facility] OR;  Service: General;  Laterality: N/A;   MASS EXCISION Left 05/28/2019   Procedure: EXCISION SOFT TISSUE MASS LEFT SHOULDER;  Surgeon: Darnell Level, MD;  Location: [facility] ORS;  Service: General;  Laterality: Left;   NEPHRECTOMY Bilateral    PARATHYROIDECTOMY N/A 06/12/2016   Procedure: TOTAL PARATHYROIDECTOMY WITH AUTOTRANSPLANTATION TO LEFT FOREARM;  Surgeon: Darnell Level, MD;  Location: [facility] OR;  Service: General;  Laterality: N/A;   PARATHYROIDECTOMY N/A 10/23/2016   Procedure: PARATHYROIDECTOMY;  Surgeon: Darnell Level, MD;  Location: [facility] OR;  Service: General;  Laterality: N/A;   REVERSE SHOULDER ARTHROPLASTY Right 08/24/2020   Procedure: REVERSE SHOULDER ARTHROPLASTY;  Surgeon: Bjorn Pippin, MD;  Location: [facility] OR;  Service: Orthopedics;  Laterality: Right;   REVISON OF ARTERIOVENOUS FISTULA Right 07/16/2017   Procedure: REVISION OF ARTERIOVENOUS FISTULA  Right ARM;  Surgeon: Maeola Harman, MD;  Location: [facility] OR;  Service: Vascular;   Laterality: Right;   TOTAL HIP ARTHROPLASTY Left 11/14/2020   Procedure: LEFT TOTAL HIP ARTHROPLASTY ANTERIOR APPROACH;  Surgeon: Tarry Kos, MD;  Location: [facility] OR;  Service: Orthopedics;  Laterality: Left;   UPPER GASTROINTESTINAL ENDOSCOPY  2021   @ [facility]   Patient Active Problem List   Diagnosis Date Noted   Duodenitis    Gastritis and gastroduodenitis    Ischemic colitis (HCC)    Bright red blood per rectum    ABLA (acute blood loss anemia)    Gastric ulcer    Duodenal ulcer    Diverticulosis of colon without hemorrhage    GI bleed 09/17/2021   Acute upper GI bleeding 09/11/2021   Mucosal abnormality of esophagus    Mucosal abnormality of stomach    Mucosal abnormality of duodenum    Upper GI bleed    Left hip pain 11/16/2020   Type 2 diabetes mellitus with other specified complication (HCC) 11/14/2020   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/14/2020   Primary osteoarthritis of left hip 10/07/2020  Status post reverse total arthroplasty of right shoulder 08/24/2020   Diverticulitis 03/08/2020   ESRD (end stage renal disease) (HCC) 03/08/2020   Polyneuropathy in diseases classified elsewhere (HCC) 03/08/2020   Pure hypercholesterolemia 03/08/2020   Sacral back pain 03/08/2020   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 11/10/2019   Acute gastric ulcer with hemorrhage    Acute GI bleeding 07/31/2019   Hypotension of hemodialysis 07/31/2019   Diverticulitis large intestine 07/31/2019   Mass of soft tissue of shoulder 05/26/2019   Awaiting organ transplant status 03/18/2019   Finding of cocaine in blood 02/25/2019   Anaphylactic shock, unspecified, sequela 11/21/2018   Hypercalcemia 10/28/2018   Diarrhea, unspecified 08/08/2018   Hyperkalemia 03/12/2018   Renal cell carcinoma of left kidney (HCC) 12/24/2017   Fluid overload, unspecified 07/06/2017   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   Other specified disorders of kidney and ureter 11/22/2016    Secondary hyperparathyroidism of renal origin (HCC) 10/23/2016   Headache disorder 08/21/2016   TIA (transient ischemic attack)    Essential hypertension    Seizures (HCC)    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    Lung nodule    Diverticulitis of cecum 07/21/2015   Diverticulitis of large intestine without perforation or abscess without bleeding    Right lower quadrant pain    Obstructive sleep apnea 02/08/2015   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   Diverticulosis 07/13/2014   Lower GI bleed 07/12/2014   Refusal of blood transfusions as patient is Jehovah's Witness    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   HLD (hyperlipidemia)    PRES (posterior reversible encephalopathy syndrome)    History of lacunar cerebrovascular accident (CVA) 05/23/2014   Anemia 05/23/2014   GERD (gastroesophageal reflux disease) 12/09/2012   Gastrointestinal bleeding 11/12/2012   Melena 11/12/2012   Other psychoactive substance dependence, uncomplicated (HCC) 10/08/2012   Iron deficiency anemia, unspecified 06/10/2012   Moderate protein-calorie malnutrition (HCC) 07/29/2009   Coagulation defect, unspecified (HCC) 08/29/2006   Type 2 diabetes mellitus with diabetic peripheral angiopathy without gangrene (HCC) 08/29/2006   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 stage renal disease (HCC) 08/02/2006    PCP: Darrow Bussing, MD  REFERRING PROVIDER: Darrow Bussing, MD  REFERRING DIAG: Diagnosis  M25.511 (ICD-10-CM) - Pain in right shoulder    THERAPY DIAG:  Muscle weakness (generalized)  Stiffness of right shoulder, not elsewhere classified  Chronic right shoulder pain  Rationale for Evaluation and Treatment: Rehabilitation  ONSET DATE: Rt reverse total shoulder: 08/2020, pain began 1 month ago  SUBJECTIVE:  SUBJECTIVE STATEMENT: Pt presents to PT stating that his exercises are going okay.  States that he goes for left carpal tunnel release tomorrow morning.  Pt reports pain of 5/10 after taking pain medication this morning.  Hand dominance: Right  PERTINENT HISTORY: Sleep apnea, dialysis M/W/F, Lt total hip replacement (11/2020), Rt total shoulder (08/2020)  PAIN:  Are you having pain? Yes: NPRS scale: 5/10 Pain location: Rt shoulder and scapula into Lt scapula Pain description: aching  Aggravating factors: using the arm, driving, sleep  Relieving factors: heating pad, sometimes medication  PRECAUTIONS: Fall and Other: hemodialysis on M,W, and F  RED FLAGS: None   WEIGHT BEARING RESTRICTIONS: No  FALLS:  Has patient fallen in last 6 months? No  LIVING ENVIRONMENT: Lives with: lives with their family Lives in: House/apartment  OCCUPATION: None  PLOF: Independent with basic ADLs and Leisure: walking for exercise  PATIENT GOALS: reduce shoulder pain, use Rt UE without limitation, sleep without interruption   NEXT MD VISIT: none   OBJECTIVE:  Note: Objective measures were completed at Evaluation unless otherwise noted.  DIAGNOSTIC FINDINGS:  Rt shoulder 12/21/22: Well-seated reverse shoulder replacement without complication   PATIENT SURVEYS :  02/24/22: Neldon Mc 57  COGNITION: Overall cognitive status: Within functional limits for tasks assessed     SENSATION: WFL  POSTURE: Posterior pelvic tilt, significant trunk flexion, rounded shoulders and forward head  UPPER EXTREMITY ROM:   Active ROM Right eval Left eval  Shoulder flexion 80 (145 supine) Limited by 25% throughout   Shoulder extension 55   Shoulder abduction 60 (75)    Shoulder adduction    Shoulder internal rotation Full (supine)   Shoulder external rotation 35 (supine)   Elbow flexion    Elbow  extension    Wrist flexion    Wrist extension    Wrist ulnar deviation    Wrist radial deviation    Wrist pronation    Wrist supination    (Blank rows = not tested)  UPPER EXTREMITY MMT:  MMT Right eval Left eval  Shoulder flexion 3- 4-  Shoulder extension 4 4+  Shoulder abduction 3- 4-  Shoulder adduction    Shoulder internal rotation 4- 4+  Shoulder external rotation 3- 4  Middle trapezius    Lower trapezius    Elbow flexion  4+  Elbow extension    Wrist flexion    Wrist extension    Wrist ulnar deviation    Wrist radial deviation    Wrist pronation    Wrist supination    Grip strength (lbs)    (Blank rows = not tested)    JOINT MOBILITY TESTING:  WFLs   PALPATION:  Diffuse palpable tenderness over Rt posterior shoulder and proximal biceps insertion    TODAY'S TREATMENT:  DATE: 01/02/2023 Nustep level 4 x6 min with PT present to discuss status Standing shoulder wall wash with towel for right shoulder flexion and scaption 2x10 each Standing 4D ball roll x20 each right UE Supine shoulder flexion with 2# on cane for AA/ROM 2x10 Supine chest press with 2# on cane 2x10 Supine shoulder ER with red tband 2x10 Supine shoulder horizontal abduction with red tband 2x10 Supine serratus punch with 2# dumbbell 2x10 RUE Supine tracing of alphabet with 2# dumbbell A-Z right UE   DATE: 12/26/22 HEP established-see below  PATIENT EDUCATION: Education details: Access Code: [email or web address] Person educated: Patient Education method: Explanation, Demonstration, and Handouts Education comprehension: verbalized understanding and returned demonstration  HOME EXERCISE PROGRAM: Access Code: [email or web address] URL: [email or web address] Date: 12/26/2022 Prepared by: Tresa Endo  Exercises - Shoulder Flexion Wall Slide with Towel  - 1-2 x daily - 7  x weekly - 2 sets - 10 reps - Supine Shoulder Flexion with Free Weight  - 2 x daily - 7 x weekly - 2 sets - 10 reps - Supine Shoulder Flexion AAROM  - 2 x daily - 7 x weekly - 1 sets - 10 reps - 10 hold - Sidelying Shoulder External Rotation  - 1 x daily - 7 x weekly - 3 sets - 10 reps  ASSESSMENT:  CLINICAL IMPRESSION: Mr Lotspeich presents to skilled PT reporting that he had dialysis this morning.  Patient report that he has planned left carpal tunnel surgery tomorrow morning.  Patient able to demonstrate HEP during session.  Able to progress with ROM and strengthening exercises during session.  Performed exercises to help with scapular strengthening during session.  Patient able to progress during session, reporting decreased pain as the session progressed.  Patient continues to require skilled PT to progress towards goal related activities.   OBJECTIVE IMPAIRMENTS: decreased activity tolerance, decreased ROM, decreased strength, increased muscle spasms, impaired flexibility, postural dysfunction, and pain.   ACTIVITY LIMITATIONS: carrying, lifting, and reach over head  PARTICIPATION LIMITATIONS: meal prep, cleaning, laundry, and driving  PERSONAL FACTORS: Past/current experiences, Time since onset of injury/illness/exacerbation, and 1-2 comorbidities: Rt shoulder replacement, dialysis   are also affecting patient's functional outcome.   REHAB POTENTIAL: Good  CLINICAL DECISION MAKING: Stable/uncomplicated  EVALUATION COMPLEXITY: Low  GOALS: Goals reviewed with patient? Yes  SHORT TERM GOALS: Target date: 01/23/2023    Be independent in initial HEP Baseline: Goal status: MET  2.  Improve DASH to < or = to 48  Baseline: 59 Goal status: INITIAL  3.  Demonstrate Rt shoulder A/ROM flexion to > or = to 90 to improve reaching  Baseline:  Goal status: INITIAL  4.  Report > or = to 25% fewer sleep interruptions due to Rt shoulder pain Baseline: waking 3-4x/night  Goal status:  INITIAL  LONG TERM GOALS: Target date: 02/20/2023    Be independent in advanced HEP Baseline:  Goal status: INITIAL  2.  Improve DASH to < or = to 40  Baseline: 59 Goal status: INITIAL  3.  Report > or = to 50% fewer sleep interruptions due to Rt shoulder pain Baseline:  Goal status: INITIAL  4.   Demonstrate Rt shoulder A/ROM flexion to > or = to 105 to improve reaching Baseline: 80 Goal status: INITIAL  5.  Report > or = to 50% reduction in Rt shoulder pain with use with ADLs and self-care Baseline:  Goal status: INITIAL   PLAN: PT FREQUENCY: 2x/week  PT DURATION: 8 weeks  PLANNED INTERVENTIONS: 97110-Therapeutic exercises,  [id number]- Therapeutic activity, [id number]- Neuromuscular re-education, [phone]- Self Care, [id number]- Manual therapy, [id number]- Aquatic Therapy, 97014- Electrical stimulation (unattended), [phone]- Electrical stimulation (manual), [phone]- Ionotophoresis 4mg /ml Dexamethasone, Patient/Family education, Dry Needling, Joint mobilization, Joint manipulation, Cryotherapy, and Moist heat  PLAN FOR NEXT SESSION: add shoulder isometrics, review HEP, arm bike, reaching overhead, inquire about any precautions after left carpal tunnel release on 01/03/2023   Reather Laurence, PT, DPT 01/02/23, 11:44 AM  [facility] [address] [place], [place] [address] Phone # [phone] Fax [phone]

## 2023-01-03 ENCOUNTER — Ambulatory Visit (HOSPITAL_COMMUNITY): Admission: RE | Admit: 2023-01-03 | Payer: 59 | Source: Home / Self Care | Admitting: Orthopedic Surgery

## 2023-01-03 HISTORY — DX: Other nonrheumatic aortic valve disorders: I35.8

## 2023-01-03 SURGERY — CARPAL TUNNEL RELEASE
Anesthesia: Choice | Laterality: Left

## 2023-01-04 DIAGNOSIS — N186 End stage renal disease: Secondary | ICD-10-CM | POA: Diagnosis not present

## 2023-01-04 DIAGNOSIS — N2581 Secondary hyperparathyroidism of renal origin: Secondary | ICD-10-CM | POA: Diagnosis not present

## 2023-01-04 DIAGNOSIS — Z992 Dependence on renal dialysis: Secondary | ICD-10-CM | POA: Diagnosis not present

## 2023-01-04 DIAGNOSIS — D689 Coagulation defect, unspecified: Secondary | ICD-10-CM | POA: Diagnosis not present

## 2023-01-04 DIAGNOSIS — D509 Iron deficiency anemia, unspecified: Secondary | ICD-10-CM | POA: Diagnosis not present

## 2023-01-04 DIAGNOSIS — R519 Headache, unspecified: Secondary | ICD-10-CM | POA: Diagnosis not present

## 2023-01-04 DIAGNOSIS — I10 Essential (primary) hypertension: Secondary | ICD-10-CM | POA: Diagnosis not present

## 2023-01-04 DIAGNOSIS — E875 Hyperkalemia: Secondary | ICD-10-CM | POA: Diagnosis not present

## 2023-01-04 DIAGNOSIS — D631 Anemia in chronic kidney disease: Secondary | ICD-10-CM | POA: Diagnosis not present

## 2023-01-06 DIAGNOSIS — D689 Coagulation defect, unspecified: Secondary | ICD-10-CM | POA: Diagnosis not present

## 2023-01-06 DIAGNOSIS — E875 Hyperkalemia: Secondary | ICD-10-CM | POA: Diagnosis not present

## 2023-01-06 DIAGNOSIS — N186 End stage renal disease: Secondary | ICD-10-CM | POA: Diagnosis not present

## 2023-01-06 DIAGNOSIS — D631 Anemia in chronic kidney disease: Secondary | ICD-10-CM | POA: Diagnosis not present

## 2023-01-06 DIAGNOSIS — R519 Headache, unspecified: Secondary | ICD-10-CM | POA: Diagnosis not present

## 2023-01-06 DIAGNOSIS — D509 Iron deficiency anemia, unspecified: Secondary | ICD-10-CM | POA: Diagnosis not present

## 2023-01-06 DIAGNOSIS — N2581 Secondary hyperparathyroidism of renal origin: Secondary | ICD-10-CM | POA: Diagnosis not present

## 2023-01-06 DIAGNOSIS — Z992 Dependence on renal dialysis: Secondary | ICD-10-CM | POA: Diagnosis not present

## 2023-01-08 DIAGNOSIS — E875 Hyperkalemia: Secondary | ICD-10-CM | POA: Diagnosis not present

## 2023-01-08 DIAGNOSIS — Z992 Dependence on renal dialysis: Secondary | ICD-10-CM | POA: Diagnosis not present

## 2023-01-08 DIAGNOSIS — R519 Headache, unspecified: Secondary | ICD-10-CM | POA: Diagnosis not present

## 2023-01-08 DIAGNOSIS — D509 Iron deficiency anemia, unspecified: Secondary | ICD-10-CM | POA: Diagnosis not present

## 2023-01-08 DIAGNOSIS — N2581 Secondary hyperparathyroidism of renal origin: Secondary | ICD-10-CM | POA: Diagnosis not present

## 2023-01-08 DIAGNOSIS — N186 End stage renal disease: Secondary | ICD-10-CM | POA: Diagnosis not present

## 2023-01-08 DIAGNOSIS — D689 Coagulation defect, unspecified: Secondary | ICD-10-CM | POA: Diagnosis not present

## 2023-01-08 DIAGNOSIS — D631 Anemia in chronic kidney disease: Secondary | ICD-10-CM | POA: Diagnosis not present

## 2023-01-11 DIAGNOSIS — R519 Headache, unspecified: Secondary | ICD-10-CM | POA: Diagnosis not present

## 2023-01-11 DIAGNOSIS — N186 End stage renal disease: Secondary | ICD-10-CM | POA: Diagnosis not present

## 2023-01-11 DIAGNOSIS — D509 Iron deficiency anemia, unspecified: Secondary | ICD-10-CM | POA: Diagnosis not present

## 2023-01-11 DIAGNOSIS — D631 Anemia in chronic kidney disease: Secondary | ICD-10-CM | POA: Diagnosis not present

## 2023-01-11 DIAGNOSIS — E875 Hyperkalemia: Secondary | ICD-10-CM | POA: Diagnosis not present

## 2023-01-11 DIAGNOSIS — Z992 Dependence on renal dialysis: Secondary | ICD-10-CM | POA: Diagnosis not present

## 2023-01-11 DIAGNOSIS — N2581 Secondary hyperparathyroidism of renal origin: Secondary | ICD-10-CM | POA: Diagnosis not present

## 2023-01-11 DIAGNOSIS — D689 Coagulation defect, unspecified: Secondary | ICD-10-CM | POA: Diagnosis not present

## 2023-01-12 DIAGNOSIS — I12 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 stage renal disease: Secondary | ICD-10-CM | POA: Diagnosis not present

## 2023-01-12 DIAGNOSIS — N186 End stage renal disease: Secondary | ICD-10-CM | POA: Diagnosis not present

## 2023-01-12 DIAGNOSIS — Z992 Dependence on renal dialysis: Secondary | ICD-10-CM | POA: Diagnosis not present

## 2023-01-14 DIAGNOSIS — D689 Coagulation defect, unspecified: Secondary | ICD-10-CM | POA: Diagnosis not present

## 2023-01-14 DIAGNOSIS — Z992 Dependence on renal dialysis: Secondary | ICD-10-CM | POA: Diagnosis not present

## 2023-01-14 DIAGNOSIS — E875 Hyperkalemia: Secondary | ICD-10-CM | POA: Diagnosis not present

## 2023-01-14 DIAGNOSIS — N186 End stage renal disease: Secondary | ICD-10-CM | POA: Diagnosis not present

## 2023-01-14 DIAGNOSIS — N2581 Secondary hyperparathyroidism of renal origin: Secondary | ICD-10-CM | POA: Diagnosis not present

## 2023-01-14 DIAGNOSIS — D631 Anemia in chronic kidney disease: Secondary | ICD-10-CM | POA: Diagnosis not present

## 2023-01-14 DIAGNOSIS — R197 Diarrhea, unspecified: Secondary | ICD-10-CM | POA: Diagnosis not present

## 2023-01-15 ENCOUNTER — Ambulatory Visit: Payer: 59 | Attending: Family Medicine | Admitting: Rehabilitative and Restorative Service Providers"

## 2023-01-15 ENCOUNTER — Encounter: Payer: Self-pay | Admitting: Rehabilitative and Restorative Service Providers"

## 2023-01-15 DIAGNOSIS — G8929 Other chronic pain: Secondary | ICD-10-CM | POA: Insufficient documentation

## 2023-01-15 DIAGNOSIS — M25511 Pain in right shoulder: Secondary | ICD-10-CM | POA: Diagnosis not present

## 2023-01-15 DIAGNOSIS — M25561 Pain in right knee: Secondary | ICD-10-CM | POA: Diagnosis not present

## 2023-01-15 DIAGNOSIS — M25611 Stiffness of right shoulder, not elsewhere classified: Secondary | ICD-10-CM | POA: Diagnosis not present

## 2023-01-15 DIAGNOSIS — H6121 Impacted cerumen, right ear: Secondary | ICD-10-CM | POA: Diagnosis not present

## 2023-01-15 DIAGNOSIS — H6992 Unspecified Eustachian tube disorder, left ear: Secondary | ICD-10-CM | POA: Diagnosis not present

## 2023-01-15 DIAGNOSIS — H9311 Tinnitus, right ear: Secondary | ICD-10-CM | POA: Diagnosis not present

## 2023-01-15 DIAGNOSIS — M6281 Muscle weakness (generalized): Secondary | ICD-10-CM | POA: Insufficient documentation

## 2023-01-15 DIAGNOSIS — H6092 Unspecified otitis externa, left ear: Secondary | ICD-10-CM | POA: Diagnosis not present

## 2023-01-15 NOTE — Therapy (Signed)
OUTPATIENT PHYSICAL THERAPY TREATMENT NOTE   Patient Name: Carl Abraha Sr. MRN: [id number] DOB:01/17/1961, 62 y.o., male Today's Date: 01/15/2023  END OF SESSION:  PT End of Session - 01/15/23 1114     Visit Number 3    Date for PT Re-Evaluation 02/20/23    Authorization Type [employer] dual complete    Progress Note Due on Visit 10    PT Start Time 1111   Pt arrived late for appointment secondary to previous appointment   PT Stop Time 1145    PT Time Calculation (min) 34 min    Activity Tolerance Patient tolerated treatment well    Behavior During Therapy WFL for tasks assessed/performed             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    Parathyroid abnormality (HCC)    ectopic parathyroid gland   Presence of arteriovenous fistula for hemodialysis, primary (HCC)    RUE PER PT RLE   Refusal of blood product    NO WHOLE BLOOD PROUCTS   Renal cell carcinoma (HCC)    s/p hand assisted laparoscopic bilateral nephrectomies 11/29/17, + RCC left   Secondary hyperparathyroidism (HCC)    Seizures (HCC)    one episode in past, due to" elevated Potassium" 08/02/20- "at least 4 years ago"   Sleep apnea    doesn't use CPAP anymore since weight loss   Stroke [facility])    no residual   Past Surgical History:  Procedure Laterality Date   AV FISTULA PLACEMENT Right    right arm   BIOPSY  08/01/2019   Procedure: BIOPSY;  Surgeon: Charna Elizabeth, MD;  Location:  [name] ENDOSCOPY;  Service: Endoscopy;;   BIOPSY  11/18/2020   Procedure: BIOPSY;  Surgeon: Jenel Lucks, MD;  Location: [facility] ENDOSCOPY;  Service: Gastroenterology;;   BIOPSY  09/13/2021   Procedure: BIOPSY;  Surgeon: Lemar Lofty., MD;  Location: [facility] ENDOSCOPY;  Service: Gastroenterology;;   CATARACT EXTRACTION W/ INTRAOCULAR LENS  IMPLANT, BILATERAL     COLON SURGERY     COLONOSCOPY N/A 08/04/2015   Procedure: COLONOSCOPY;  Surgeon: Beverley Fiedler, MD;  Location: [facility] ENDOSCOPY;  Service: Endoscopy;  Laterality: N/A;   COLONOSCOPY  2017   JMP@ [facility]-good prep-mass -recall 1 yr   COLONOSCOPY N/A 09/13/2021   Procedure: COLONOSCOPY;  Surgeon: Mansouraty, Netty Starring., MD;  Location: [facility] ENDOSCOPY;  Service: Gastroenterology;  Laterality: N/A;   COLONOSCOPY WITH PROPOFOL N/A 11/25/2020   Procedure: COLONOSCOPY WITH PROPOFOL;  Surgeon: Imogene Burn, MD;  Location: [facility] ENDOSCOPY;  Service: Gastroenterology;  Laterality: N/A;   COLONOSCOPY WITH PROPOFOL N/A 09/23/2021   Procedure: COLONOSCOPY WITH PROPOFOL;  Surgeon: Shellia Cleverly, DO;  Location: [facility] ENDOSCOPY;  Service: Gastroenterology;  Laterality: N/A;   ENTEROSCOPY N/A 09/23/2021  Procedure: ENTEROSCOPY;  Surgeon: Shellia Cleverly, DO;  Location: [facility] ENDOSCOPY;  Service: Gastroenterology;  Laterality: N/A;  Will place order for video capsule study as we may opt to place it during procedure   ESOPHAGOGASTRODUODENOSCOPY N/A 08/01/2019   Procedure: ESOPHAGOGASTRODUODENOSCOPY (EGD);  Surgeon: Charna Elizabeth, MD;  Location: [facility] ENDOSCOPY;  Service: Endoscopy;  Laterality: N/A;   ESOPHAGOGASTRODUODENOSCOPY N/A 11/18/2020   Procedure: ESOPHAGOGASTRODUODENOSCOPY (EGD);  Surgeon: Jenel Lucks, MD;  Location: [facility] ENDOSCOPY;  Service: Gastroenterology;  Laterality: N/A;   ESOPHAGOGASTRODUODENOSCOPY (EGD) WITH PROPOFOL N/A 08/04/2019   Procedure: ESOPHAGOGASTRODUODENOSCOPY (EGD) WITH PROPOFOL;  Surgeon: Benancio Deeds, MD;  Location: [facility]  ENDOSCOPY;  Service: Gastroenterology;  Laterality: N/A;   ESOPHAGOGASTRODUODENOSCOPY (EGD) WITH PROPOFOL N/A 09/13/2021   Procedure: ESOPHAGOGASTRODUODENOSCOPY (EGD) WITH PROPOFOL;  Surgeon: Meridee Score Netty Starring., MD;  Location: [facility] ENDOSCOPY;  Service: Gastroenterology;  Laterality: N/A;   GIVENS CAPSULE STUDY N/A 09/23/2021   Procedure: GIVENS CAPSULE STUDY;  Surgeon: Shellia Cleverly, DO;  Location: [facility] ENDOSCOPY;  Service: Gastroenterology;  Laterality: N/A;   graft left arm Left    for dialysis x 2. Removed   HOT HEMOSTASIS  11/18/2020   Procedure: HOT HEMOSTASIS (ARGON PLASMA COAGULATION/BICAP);  Surgeon: Jenel Lucks, MD;  Location: [facility] ENDOSCOPY;  Service: Gastroenterology;;   HOT HEMOSTASIS N/A 09/23/2021   Procedure: HOT HEMOSTASIS (ARGON PLASMA COAGULATION/BICAP);  Surgeon: Shellia Cleverly, DO;  Location: [facility] ENDOSCOPY;  Service: Gastroenterology;  Laterality: N/A;   INSERTION OF DIALYSIS CATHETER     Rt chest   LAPAROSCOPIC RIGHT COLECTOMY N/A 08/05/2015   Procedure: LAPAROSCOPIC RIGHT COLECTOMY- ASCENDING;  Surgeon: Almond Lint, MD;  Location: [facility] OR;  Service: General;  Laterality: N/A;   MASS EXCISION Left 05/28/2019   Procedure: EXCISION SOFT TISSUE MASS LEFT SHOULDER;  Surgeon: Darnell Level, MD;  Location: [facility] ORS;  Service: General;  Laterality: Left;   NEPHRECTOMY Bilateral    PARATHYROIDECTOMY N/A 06/12/2016   Procedure: TOTAL PARATHYROIDECTOMY WITH AUTOTRANSPLANTATION TO LEFT FOREARM;  Surgeon: Darnell Level, MD;  Location: [facility] OR;  Service: General;  Laterality: N/A;   PARATHYROIDECTOMY N/A 10/23/2016   Procedure: PARATHYROIDECTOMY;  Surgeon: Darnell Level, MD;  Location: [facility] OR;  Service: General;  Laterality: N/A;   REVERSE SHOULDER ARTHROPLASTY Right 08/24/2020   Procedure: REVERSE SHOULDER ARTHROPLASTY;  Surgeon: Bjorn Pippin, MD;  Location: [facility] OR;  Service: Orthopedics;  Laterality: Right;   REVISON OF ARTERIOVENOUS FISTULA Right 07/16/2017   Procedure: REVISION OF  ARTERIOVENOUS FISTULA  Right ARM;  Surgeon: Maeola Harman, MD;  Location: [facility] OR;  Service: Vascular;  Laterality: Right;   TOTAL HIP ARTHROPLASTY Left 11/14/2020   Procedure: LEFT TOTAL HIP ARTHROPLASTY ANTERIOR APPROACH;  Surgeon: Tarry Kos, MD;  Location: [facility] OR;  Service: Orthopedics;  Laterality: Left;   UPPER GASTROINTESTINAL ENDOSCOPY  2021   @ [facility]   Patient Active Problem List   Diagnosis Date Noted   Duodenitis    Gastritis and gastroduodenitis    Ischemic colitis (HCC)    Bright red blood per rectum    ABLA (acute blood loss anemia)    Gastric ulcer    Duodenal ulcer    Diverticulosis of colon without hemorrhage    GI bleed 09/17/2021   Acute upper GI bleeding 09/11/2021   Mucosal abnormality of esophagus    Mucosal abnormality of stomach    Mucosal abnormality of duodenum    Upper GI bleed    Left hip pain 11/16/2020   Type 2 diabetes mellitus with other  specified complication (HCC) 11/14/2020   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/14/2020   Primary osteoarthritis of left hip 10/07/2020   Status post reverse total arthroplasty of right shoulder 08/24/2020   Diverticulitis 03/08/2020   ESRD (end stage renal disease) (HCC) 03/08/2020   Polyneuropathy in diseases classified elsewhere (HCC) 03/08/2020   Pure hypercholesterolemia 03/08/2020   Sacral back pain 03/08/2020   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 11/10/2019   Acute gastric ulcer with hemorrhage    Acute GI bleeding 07/31/2019   Hypotension of hemodialysis 07/31/2019   Diverticulitis large intestine 07/31/2019   Mass of soft tissue of shoulder 05/26/2019   Awaiting organ transplant status 03/18/2019   Finding of cocaine in blood 02/25/2019   Anaphylactic shock, unspecified, sequela 11/21/2018   Hypercalcemia 10/28/2018   Diarrhea, unspecified 08/08/2018   Hyperkalemia 03/12/2018   Renal cell carcinoma of left kidney (HCC) 12/24/2017   Fluid overload, unspecified 07/06/2017    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   Other specified disorders of kidney and ureter 11/22/2016   Secondary hyperparathyroidism of renal origin (HCC) 10/23/2016   Headache disorder 08/21/2016   TIA (transient ischemic attack)    Essential hypertension    Seizures (HCC)    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    Lung nodule    Diverticulitis of cecum 07/21/2015   Diverticulitis of large intestine without perforation or abscess without bleeding    Right lower quadrant pain    Obstructive sleep apnea 02/08/2015   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   Diverticulosis 07/13/2014   Lower GI bleed 07/12/2014   Refusal of blood transfusions as patient is Jehovah's Witness    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   HLD (hyperlipidemia)    PRES (posterior reversible encephalopathy syndrome)    History of lacunar cerebrovascular accident (CVA) 05/23/2014   Anemia 05/23/2014   GERD (gastroesophageal reflux disease) 12/09/2012   Gastrointestinal bleeding 11/12/2012   Melena 11/12/2012   Other psychoactive substance dependence, uncomplicated (HCC) 10/08/2012   Iron deficiency anemia, unspecified 06/10/2012   Moderate protein-calorie malnutrition (HCC) 07/29/2009   Coagulation defect, unspecified (HCC) 08/29/2006   Type 2 diabetes mellitus with diabetic peripheral angiopathy without gangrene (HCC) 08/29/2006   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 stage renal disease (HCC) 08/02/2006    PCP: Darrow Bussing, MD  REFERRING PROVIDER: Darrow Bussing, MD  REFERRING DIAG: Diagnosis  M25.511 (ICD-10-CM) - Pain in right shoulder    THERAPY DIAG:  Muscle weakness (generalized)  Stiffness of right shoulder, not elsewhere classified  Chronic right shoulder pain  Rationale for Evaluation and Treatment: Rehabilitation  ONSET DATE: Rt reverse total shoulder: 08/2020,  pain began 1 month ago  SUBJECTIVE:  SUBJECTIVE STATEMENT: Pt reports that he did not have his carpal tunnel surgery secondary to not realizing that he needed to stop Tumeric before.  Patient with no new complaints.  Hand dominance: Right  PERTINENT HISTORY: Sleep apnea, dialysis M/W/F, Lt total hip replacement (11/2020), Rt total shoulder (08/2020)  PAIN:  Are you having pain? Yes: NPRS scale: 4-5/10 Pain location: Rt shoulder and scapula into Lt scapula Pain description: aching  Aggravating factors: using the arm, driving, sleep  Relieving factors: heating pad, sometimes medication  PRECAUTIONS: Fall and Other: hemodialysis on M,W, and F  RED FLAGS: None   WEIGHT BEARING RESTRICTIONS: No  FALLS:  Has patient fallen in last 6 months? No  LIVING ENVIRONMENT: Lives with: lives with their family Lives in: House/apartment  OCCUPATION: None  PLOF: Independent with basic ADLs and Leisure: walking for exercise  PATIENT GOALS: reduce shoulder pain, use Rt UE without limitation, sleep without interruption   NEXT MD VISIT: none   OBJECTIVE:  Note: Objective measures were completed at Evaluation unless otherwise noted.  DIAGNOSTIC FINDINGS:  Rt shoulder 12/21/22: Well-seated reverse shoulder replacement without complication   PATIENT SURVEYS :  02/24/22: Neldon Mc 57  COGNITION: Overall cognitive status: Within functional limits for tasks assessed     SENSATION: WFL  POSTURE: Posterior pelvic tilt, significant trunk flexion, rounded shoulders and forward head  UPPER EXTREMITY ROM:   Active ROM Right eval Left eval  Shoulder flexion 80 (145 supine) Limited by 25% throughout   Shoulder extension 55   Shoulder abduction 60 (75)    Shoulder adduction    Shoulder internal rotation Full  (supine)   Shoulder external rotation 35 (supine)   Elbow flexion    Elbow extension    Wrist flexion    Wrist extension    Wrist ulnar deviation    Wrist radial deviation    Wrist pronation    Wrist supination    (Blank rows = not tested)  UPPER EXTREMITY MMT:  MMT Right eval Left eval  Shoulder flexion 3- 4-  Shoulder extension 4 4+  Shoulder abduction 3- 4-  Shoulder adduction    Shoulder internal rotation 4- 4+  Shoulder external rotation 3- 4  Middle trapezius    Lower trapezius    Elbow flexion  4+  Elbow extension    Wrist flexion    Wrist extension    Wrist ulnar deviation    Wrist radial deviation    Wrist pronation    Wrist supination    Grip strength (lbs)    (Blank rows = not tested)    JOINT MOBILITY TESTING:  WFLs   PALPATION:  Diffuse palpable tenderness over Rt posterior shoulder and proximal biceps insertion    TODAY'S TREATMENT:  DATE: 01/15/2023 Nustep level 5 x6 min with PT present to discuss status Standing shoulder wall wash with towel for right shoulder flexion 2x15 Standing shoulder wall wash with towel for right shoulder scaption 2x10 Standing 4D ball roll x20 each right UE Supine shoulder flexion with 2.5# on cane for AA/ROM 2x10 Supine chest press with 2.5# on cane 2x10 Supine serratus punch with 2# dumbbell 2x10 bilat Supine tracing of alphabet with 2# dumbbell A-Z right UE Supine shoulder ER with red tband 2x10 Supine shoulder horizontal abduction with red tband 2x10   DATE: 01/02/2023 Nustep level 4 x6 min with PT present to discuss status Standing shoulder wall wash with towel for right shoulder flexion and scaption 2x10 each Standing 4D ball roll x20 each right UE Supine shoulder flexion with 2# on cane for AA/ROM 2x10 Supine chest press with 2# on cane 2x10 Supine shoulder ER with red  tband 2x10 Supine shoulder horizontal abduction with red tband 2x10 Supine serratus punch with 2# dumbbell 2x10 RUE Supine tracing of alphabet with 2# dumbbell A-Z right UE     PATIENT EDUCATION: Education details: Access Code: [email or web address] Person educated: Patient Education method: Explanation, Demonstration, and Handouts Education comprehension: verbalized understanding and returned demonstration  HOME EXERCISE PROGRAM: Access Code: [email or web address] URL: [email or web address] Date: 01/15/2023 Prepared by: Clydie Braun Ronrico Dupin  Exercises - Shoulder Flexion Wall Slide with Towel  - 1-2 x daily - 7 x weekly - 2 sets - 10 reps - Supine Shoulder Flexion with Free Weight  - 2 x daily - 7 x weekly - 2 sets - 10 reps - Supine Shoulder Flexion AAROM  - 2 x daily - 7 x weekly - 1 sets - 10 reps - 10 hold - Sidelying Shoulder External Rotation  - 1 x daily - 7 x weekly - 3 sets - 10 reps - Shoulder External Rotation and Scapular Retraction with Resistance  - 1 x daily - 7 x weekly - 2 sets - 10 reps - Standing Shoulder Horizontal Abduction with Resistance  - 1 x daily - 7 x weekly - 2 sets - 10 reps  ASSESSMENT:  CLINICAL IMPRESSION: Mr Pharo presents to skilled PT reporting similar symptoms to previously.  Patient provided with updated HEP and issued red theraband for home use with exercises.  Patient continues to have pain with ROM exercises, especially as he progresses to his end range.  Patient requires min cuing throughout for technique and posture.  Patient continues to be limited with his functional tasks secondary to increased pain.   OBJECTIVE IMPAIRMENTS: decreased activity tolerance, decreased ROM, decreased strength, increased muscle spasms, impaired flexibility, postural dysfunction, and pain.   ACTIVITY LIMITATIONS: carrying, lifting, and reach over head  PARTICIPATION LIMITATIONS: meal prep, cleaning, laundry, and driving  PERSONAL FACTORS: Past/current experiences, Time  since onset of injury/illness/exacerbation, and 1-2 comorbidities: Rt shoulder replacement, dialysis   are also affecting patient's functional outcome.   REHAB POTENTIAL: Good  CLINICAL DECISION MAKING: Stable/uncomplicated  EVALUATION COMPLEXITY: Low  GOALS: Goals reviewed with patient? Yes  SHORT TERM GOALS: Target date: 01/23/2023    Be independent in initial HEP Baseline: Goal status: MET  2.  Improve DASH to < or = to 48  Baseline: 59 Goal status: INITIAL  3.  Demonstrate Rt shoulder A/ROM flexion to > or = to 90 to improve reaching  Baseline:  Goal status: INITIAL  4.  Report > or = to 25% fewer sleep interruptions due to Rt shoulder pain Baseline: waking 3-4x/night  Goal status:  INITIAL  LONG TERM GOALS: Target date: 02/20/2023    Be independent in advanced HEP Baseline:  Goal status: INITIAL  2.  Improve DASH to < or = to 40  Baseline: 59 Goal status: INITIAL  3.  Report > or = to 50% fewer sleep interruptions due to Rt shoulder pain Baseline:  Goal status: INITIAL  4.   Demonstrate Rt shoulder A/ROM flexion to > or = to 105 to improve reaching Baseline: 80 Goal status: INITIAL  5.  Report > or = to 50% reduction in Rt shoulder pain with use with ADLs and self-care Baseline:  Goal status: INITIAL   PLAN: PT FREQUENCY: 2x/week  PT DURATION: 8 weeks  PLANNED INTERVENTIONS: 97110-Therapeutic exercises, 97530- Therapeutic activity, 97112- Neuromuscular re-education, 97535- Self Care, 86578- Manual therapy, [phone]- Aquatic Therapy, 97014- Electrical stimulation (unattended), [id number]- Electrical stimulation (manual), 97033- Ionotophoresis 4mg /ml Dexamethasone, Patient/Family education, Dry Needling, Joint mobilization, Joint manipulation, Cryotherapy, and Moist heat  PLAN FOR NEXT SESSION: DASH, remeasure ROM, reaching overhead, strengthening   Reather Laurence, PT, DPT 01/15/23, 1:14 PM  [facility] [address] [place], [place] [address] Phone # [phone] Fax [phone]

## 2023-01-16 DIAGNOSIS — R197 Diarrhea, unspecified: Secondary | ICD-10-CM | POA: Diagnosis not present

## 2023-01-16 DIAGNOSIS — D689 Coagulation defect, unspecified: Secondary | ICD-10-CM | POA: Diagnosis not present

## 2023-01-16 DIAGNOSIS — N186 End stage renal disease: Secondary | ICD-10-CM | POA: Diagnosis not present

## 2023-01-16 DIAGNOSIS — Z992 Dependence on renal dialysis: Secondary | ICD-10-CM | POA: Diagnosis not present

## 2023-01-16 DIAGNOSIS — E875 Hyperkalemia: Secondary | ICD-10-CM | POA: Diagnosis not present

## 2023-01-16 DIAGNOSIS — D631 Anemia in chronic kidney disease: Secondary | ICD-10-CM | POA: Diagnosis not present

## 2023-01-16 DIAGNOSIS — N2581 Secondary hyperparathyroidism of renal origin: Secondary | ICD-10-CM | POA: Diagnosis not present

## 2023-01-18 ENCOUNTER — Ambulatory Visit: Payer: 59 | Admitting: Orthopedic Surgery

## 2023-01-18 DIAGNOSIS — D689 Coagulation defect, unspecified: Secondary | ICD-10-CM | POA: Diagnosis not present

## 2023-01-18 DIAGNOSIS — E875 Hyperkalemia: Secondary | ICD-10-CM | POA: Diagnosis not present

## 2023-01-18 DIAGNOSIS — N2581 Secondary hyperparathyroidism of renal origin: Secondary | ICD-10-CM | POA: Diagnosis not present

## 2023-01-18 DIAGNOSIS — R197 Diarrhea, unspecified: Secondary | ICD-10-CM | POA: Diagnosis not present

## 2023-01-18 DIAGNOSIS — Z992 Dependence on renal dialysis: Secondary | ICD-10-CM | POA: Diagnosis not present

## 2023-01-18 DIAGNOSIS — N186 End stage renal disease: Secondary | ICD-10-CM | POA: Diagnosis not present

## 2023-01-18 DIAGNOSIS — D631 Anemia in chronic kidney disease: Secondary | ICD-10-CM | POA: Diagnosis not present

## 2023-01-21 DIAGNOSIS — D689 Coagulation defect, unspecified: Secondary | ICD-10-CM | POA: Diagnosis not present

## 2023-01-21 DIAGNOSIS — N2581 Secondary hyperparathyroidism of renal origin: Secondary | ICD-10-CM | POA: Diagnosis not present

## 2023-01-21 DIAGNOSIS — Z992 Dependence on renal dialysis: Secondary | ICD-10-CM | POA: Diagnosis not present

## 2023-01-21 DIAGNOSIS — N186 End stage renal disease: Secondary | ICD-10-CM | POA: Diagnosis not present

## 2023-01-21 DIAGNOSIS — D631 Anemia in chronic kidney disease: Secondary | ICD-10-CM | POA: Diagnosis not present

## 2023-01-21 DIAGNOSIS — E875 Hyperkalemia: Secondary | ICD-10-CM | POA: Diagnosis not present

## 2023-01-21 DIAGNOSIS — R197 Diarrhea, unspecified: Secondary | ICD-10-CM | POA: Diagnosis not present

## 2023-01-22 ENCOUNTER — Ambulatory Visit (INDEPENDENT_AMBULATORY_CARE_PROVIDER_SITE_OTHER): Payer: 59 | Admitting: Podiatry

## 2023-01-22 ENCOUNTER — Encounter: Payer: Self-pay | Admitting: Podiatry

## 2023-01-22 DIAGNOSIS — M79675 Pain in left toe(s): Secondary | ICD-10-CM | POA: Diagnosis not present

## 2023-01-22 DIAGNOSIS — M79674 Pain in right toe(s): Secondary | ICD-10-CM

## 2023-01-22 DIAGNOSIS — M2041 Other hammer toe(s) (acquired), right foot: Secondary | ICD-10-CM

## 2023-01-22 DIAGNOSIS — E1151 Type 2 diabetes mellitus with diabetic peripheral angiopathy without gangrene: Secondary | ICD-10-CM

## 2023-01-22 DIAGNOSIS — B351 Tinea unguium: Secondary | ICD-10-CM

## 2023-01-22 NOTE — Progress Notes (Signed)
Subjective:  Patient ID: Carl Bowens Sr., male    DOB: 1960/09/19,  MRN: [id number]  Carl Bowens Sr. presents to clinic today for:  Chief Complaint  Patient presents with   [facility]    He is wanting to re-establish getting his [facility], He reports he may have "stumped his 2nd toe on his right foot as its was black and nail is discolored". "He denies any pain and he is not sure if he dropped a refigerator on his toe with moving".    Patient notes nails are thick and elongated, causing pain in shoe gear when ambulating.  He has been trying to trim his own toenails since last seen here.  He is also interested in diabetic shoes.  Denies neuropathy.  Unsure of his last HbA1c.  PCP is Docia Chuck, Dibas, MD.  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    Parathyroid abnormality (HCC)    ectopic parathyroid gland   Presence of arteriovenous fistula for hemodialysis, primary (HCC)    RUE PER PT RLE   Refusal of blood product    NO WHOLE BLOOD PROUCTS   Renal cell carcinoma (HCC)    s/p hand assisted laparoscopic bilateral nephrectomies 11/29/17, + RCC left   Secondary hyperparathyroidism (HCC)    Seizures (HCC)    one episode in past, due to" elevated Potassium" 08/02/20- "at least 4 years ago"   Sleep apnea    doesn't use CPAP anymore since weight loss   Stroke (HCC)    no residual    Allergies  Allergen Reactions   Infed [Iron Dextran] Other (See Comments)     Decreased BP    Oxycodone Nausea Only   Vicodin [Hydrocodone-Acetaminophen] Other (See Comments)    Decreased BP   Diclofenac Other (See Comments)    Unknown   Objective:  Carl Bowens Sr. is a pleasant 62 y.o. male in NAD. AAO x 3.  Vascular Examination: Patient has palpable DP pulse, absent PT pulse bilateral.  Delayed capillary refill bilateral toes.  Sparse digital hair bilateral.  Proximal to distal cooling WNL bilateral.    Dermatological Examination: Interspaces are clear with no open lesions noted bilateral.  Skin is shiny and atrophic bilateral.  Nails are 3-73mm thick, with yellowish/brown discoloration, subungual debris and distal onycholysis x10.  There is pain with compression of nails x10.  Right 2nd toenail is ecchymotic but not loose.  No surrounding erythema to nail.  Musculoskeletal Examination: Semi-flexible contracture of right 2nd toe at PIPJ.  Patient qualifies for at-risk foot care because of diabetes with PVD .  Assessment/Plan: 1. Pain due to onychomycosis of toenails of both feet   2. Hammertoe of right foot   3. Type 2 diabetes mellitus with diabetic peripheral angiopathy without gangrene, without  long-term current use of insulin (HCC)    Mycotic nails x10 were sharply debrided with sterile nail nippers and power debriding burr to decrease bulk and length.  Patient qualifies for diabetic shoes due to hammertoe deformities and diabetes with mild PVD.  Order written for diabetic shoes and appt made with our pedorthist for a diabetic shoe consult.  Return in about 3 months (around 04/22/2023) for [facility]   Clerance Lav, DPM, FACFAS [facility]     [address] [place], [place] [address]                Office [phone]  Fax [phone]

## 2023-01-23 ENCOUNTER — Ambulatory Visit: Payer: 59

## 2023-01-23 DIAGNOSIS — R197 Diarrhea, unspecified: Secondary | ICD-10-CM | POA: Diagnosis not present

## 2023-01-23 DIAGNOSIS — Z992 Dependence on renal dialysis: Secondary | ICD-10-CM | POA: Diagnosis not present

## 2023-01-23 DIAGNOSIS — M25611 Stiffness of right shoulder, not elsewhere classified: Secondary | ICD-10-CM | POA: Diagnosis not present

## 2023-01-23 DIAGNOSIS — D689 Coagulation defect, unspecified: Secondary | ICD-10-CM | POA: Diagnosis not present

## 2023-01-23 DIAGNOSIS — M6281 Muscle weakness (generalized): Secondary | ICD-10-CM

## 2023-01-23 DIAGNOSIS — N186 End stage renal disease: Secondary | ICD-10-CM | POA: Diagnosis not present

## 2023-01-23 DIAGNOSIS — E875 Hyperkalemia: Secondary | ICD-10-CM | POA: Diagnosis not present

## 2023-01-23 DIAGNOSIS — G8929 Other chronic pain: Secondary | ICD-10-CM | POA: Diagnosis not present

## 2023-01-23 DIAGNOSIS — M25561 Pain in right knee: Secondary | ICD-10-CM | POA: Diagnosis not present

## 2023-01-23 DIAGNOSIS — N2581 Secondary hyperparathyroidism of renal origin: Secondary | ICD-10-CM | POA: Diagnosis not present

## 2023-01-23 DIAGNOSIS — D631 Anemia in chronic kidney disease: Secondary | ICD-10-CM | POA: Diagnosis not present

## 2023-01-23 DIAGNOSIS — M25511 Pain in right shoulder: Secondary | ICD-10-CM | POA: Diagnosis not present

## 2023-01-23 NOTE — Therapy (Signed)
OUTPATIENT PHYSICAL THERAPY TREATMENT NOTE   Patient Name: Carl Phi Sr. MRN: [id number] DOB:05/14/1960, 62 y.o., male Today's Date: 01/23/2023  END OF SESSION:  PT End of Session - 01/23/23 1102     Visit Number 4    Date for PT Re-Evaluation 02/20/23    Authorization Type [employer] dual complete    Progress Note Due on Visit 10    PT Start Time 1102    PT Stop Time 1145    PT Time Calculation (min) 43 min    Activity Tolerance Patient tolerated treatment well    Behavior During Therapy WFL for tasks assessed/performed             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    Parathyroid abnormality (HCC)    ectopic parathyroid gland   Presence of arteriovenous fistula for hemodialysis, primary (HCC)    RUE PER PT RLE   Refusal of blood product    NO WHOLE BLOOD PROUCTS   Renal cell carcinoma (HCC)    s/p hand assisted laparoscopic bilateral nephrectomies 11/29/17, + RCC left   Secondary hyperparathyroidism (HCC)    Seizures (HCC)    one episode in past, due to" elevated Potassium" 08/02/20- "at least 4 years ago"   Sleep apnea    doesn't use CPAP anymore since weight loss   Stroke [facility])    no residual   Past Surgical History:  Procedure Laterality Date   AV FISTULA PLACEMENT Right    right arm   BIOPSY  08/01/2019   Procedure: BIOPSY;  Surgeon: Charna Elizabeth, MD;  Location: [facility] ENDOSCOPY;  Service: Endoscopy;;   BIOPSY  11/18/2020    Procedure: BIOPSY;  Surgeon: Jenel Lucks, MD;  Location: [facility] ENDOSCOPY;  Service: Gastroenterology;;   BIOPSY  09/13/2021   Procedure: BIOPSY;  Surgeon: Lemar Lofty., MD;  Location: [facility] ENDOSCOPY;  Service: Gastroenterology;;   CATARACT EXTRACTION W/ INTRAOCULAR LENS  IMPLANT, BILATERAL     COLON SURGERY     COLONOSCOPY N/A 08/04/2015   Procedure: COLONOSCOPY;  Surgeon: Beverley Fiedler, MD;  Location: [facility] ENDOSCOPY;  Service: Endoscopy;  Laterality: N/A;   COLONOSCOPY  2017   JMP@ [facility]-good prep-mass -recall 1 yr   COLONOSCOPY N/A 09/13/2021   Procedure: COLONOSCOPY;  Surgeon: Mansouraty, Netty Starring., MD;  Location: [facility] ENDOSCOPY;  Service: Gastroenterology;  Laterality: N/A;   COLONOSCOPY WITH PROPOFOL N/A 11/25/2020   Procedure: COLONOSCOPY WITH PROPOFOL;  Surgeon: Imogene Burn, MD;  Location: [facility] ENDOSCOPY;  Service: Gastroenterology;  Laterality: N/A;   COLONOSCOPY WITH PROPOFOL N/A 09/23/2021   Procedure: COLONOSCOPY WITH PROPOFOL;  Surgeon: Shellia Cleverly, DO;  Location: [facility] ENDOSCOPY;  Service: Gastroenterology;  Laterality: N/A;   ENTEROSCOPY N/A 09/23/2021   Procedure: ENTEROSCOPY;  Surgeon: Shellia Cleverly, DO;  Location: [facility] ENDOSCOPY;  Service: Gastroenterology;  Laterality: N/A;  Will place order for video capsule study as we may opt to place it during procedure   ESOPHAGOGASTRODUODENOSCOPY N/A 08/01/2019   Procedure: ESOPHAGOGASTRODUODENOSCOPY (EGD);  Surgeon: Charna Elizabeth, MD;  Location: [facility] ENDOSCOPY;  Service: Endoscopy;  Laterality: N/A;   ESOPHAGOGASTRODUODENOSCOPY N/A 11/18/2020   Procedure: ESOPHAGOGASTRODUODENOSCOPY (EGD);  Surgeon: Jenel Lucks, MD;  Location: [facility] ENDOSCOPY;  Service: Gastroenterology;  Laterality: N/A;   ESOPHAGOGASTRODUODENOSCOPY (EGD) WITH PROPOFOL N/A 08/04/2019   Procedure: ESOPHAGOGASTRODUODENOSCOPY (EGD) WITH PROPOFOL;  Surgeon: Benancio Deeds, MD;  Location: [facility] ENDOSCOPY;  Service: Gastroenterology;  Laterality: N/A;    ESOPHAGOGASTRODUODENOSCOPY (EGD) WITH PROPOFOL N/A 09/13/2021   Procedure: ESOPHAGOGASTRODUODENOSCOPY (EGD) WITH PROPOFOL;  Surgeon: Meridee Score Netty Starring., MD;  Location: [facility] ENDOSCOPY;  Service: Gastroenterology;  Laterality: N/A;   GIVENS CAPSULE STUDY N/A 09/23/2021   Procedure: GIVENS CAPSULE STUDY;  Surgeon: Shellia Cleverly, DO;  Location: [facility] ENDOSCOPY;  Service: Gastroenterology;  Laterality: N/A;   graft left arm Left    for dialysis x 2. Removed   HOT HEMOSTASIS  11/18/2020   Procedure: HOT HEMOSTASIS (ARGON PLASMA COAGULATION/BICAP);  Surgeon: Jenel Lucks, MD;  Location: [facility] ENDOSCOPY;  Service: Gastroenterology;;   HOT HEMOSTASIS N/A 09/23/2021   Procedure: HOT HEMOSTASIS (ARGON PLASMA COAGULATION/BICAP);  Surgeon: Shellia Cleverly, DO;  Location: [facility] ENDOSCOPY;  Service: Gastroenterology;  Laterality: N/A;   INSERTION OF DIALYSIS CATHETER     Rt chest   LAPAROSCOPIC RIGHT COLECTOMY N/A 08/05/2015   Procedure: LAPAROSCOPIC RIGHT COLECTOMY- ASCENDING;  Surgeon: Almond Lint, MD;  Location: [facility] OR;  Service: General;  Laterality: N/A;   MASS EXCISION Left 05/28/2019   Procedure: EXCISION SOFT TISSUE MASS LEFT SHOULDER;  Surgeon: Darnell Level, MD;  Location: [facility] ORS;  Service: General;  Laterality: Left;   NEPHRECTOMY Bilateral    PARATHYROIDECTOMY N/A 06/12/2016   Procedure: TOTAL PARATHYROIDECTOMY WITH AUTOTRANSPLANTATION TO LEFT FOREARM;  Surgeon: Darnell Level, MD;  Location: [facility] OR;  Service: General;  Laterality: N/A;   PARATHYROIDECTOMY N/A 10/23/2016   Procedure: PARATHYROIDECTOMY;  Surgeon: Darnell Level, MD;  Location: [facility] OR;  Service: General;  Laterality: N/A;   REVERSE SHOULDER ARTHROPLASTY Right 08/24/2020   Procedure: REVERSE SHOULDER ARTHROPLASTY;  Surgeon: Bjorn Pippin, MD;  Location: [facility] OR;  Service: Orthopedics;  Laterality: Right;   REVISON OF ARTERIOVENOUS FISTULA Right 07/16/2017   Procedure: REVISION OF ARTERIOVENOUS FISTULA  Right ARM;  Surgeon: Maeola Harman, MD;  Location: [facility] OR;  Service: Vascular;  Laterality: Right;   TOTAL HIP ARTHROPLASTY Left 11/14/2020   Procedure: LEFT TOTAL HIP ARTHROPLASTY ANTERIOR APPROACH;  Surgeon: Tarry Kos, MD;  Location: [facility] OR;  Service: Orthopedics;  Laterality: Left;   UPPER GASTROINTESTINAL ENDOSCOPY  2021   @ [facility]   Patient Active Problem List   Diagnosis Date Noted   Duodenitis    Gastritis and gastroduodenitis    Ischemic colitis (HCC)    Bright red blood per rectum    ABLA (acute blood loss anemia)    Gastric ulcer    Duodenal ulcer    Diverticulosis of colon without hemorrhage    GI bleed 09/17/2021   Acute upper GI bleeding 09/11/2021   Mucosal abnormality of esophagus    Mucosal abnormality of stomach    Mucosal abnormality of duodenum    Upper GI bleed    Left hip pain 11/16/2020   Type 2 diabetes mellitus with other specified complication (HCC) 11/14/2020   Status post total  replacement of left hip 11/14/2020   Primary osteoarthritis of left hip 10/07/2020   Status post reverse total arthroplasty of right shoulder 08/24/2020   Diverticulitis 03/08/2020   ESRD (end stage renal disease) (HCC) 03/08/2020   Polyneuropathy in diseases classified elsewhere (HCC) 03/08/2020   Pure hypercholesterolemia 03/08/2020   Sacral back pain 03/08/2020   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 11/10/2019   Acute gastric ulcer with hemorrhage    Acute GI bleeding 07/31/2019   Hypotension of hemodialysis 07/31/2019   Diverticulitis large intestine 07/31/2019   Mass of soft tissue of shoulder 05/26/2019   Awaiting organ transplant status 03/18/2019   Finding of cocaine in blood 02/25/2019   Anaphylactic shock, unspecified, sequela 11/21/2018   Hypercalcemia 10/28/2018   Diarrhea, unspecified 08/08/2018   Hyperkalemia 03/12/2018   Renal cell carcinoma of left kidney (HCC) 12/24/2017   Fluid overload, unspecified 07/06/2017   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   Other  specified disorders of kidney and ureter 11/22/2016   Secondary hyperparathyroidism of renal origin (HCC) 10/23/2016   Headache disorder 08/21/2016   TIA (transient ischemic attack)    Essential hypertension    Seizures (HCC)    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    Lung nodule    Diverticulitis of cecum 07/21/2015   Diverticulitis of large intestine without perforation or abscess without bleeding    Right lower quadrant pain    Obstructive sleep apnea 02/08/2015   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   Diverticulosis 07/13/2014   Lower GI bleed 07/12/2014   Refusal of blood transfusions as patient is Jehovah's Witness    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   HLD (hyperlipidemia)    PRES (posterior reversible encephalopathy syndrome)    History of lacunar cerebrovascular accident (CVA) 05/23/2014   Anemia 05/23/2014   GERD (gastroesophageal reflux disease) 12/09/2012   Gastrointestinal bleeding 11/12/2012   Melena 11/12/2012   Other psychoactive substance dependence, uncomplicated (HCC) 10/08/2012   Iron deficiency anemia, unspecified 06/10/2012   Moderate protein-calorie malnutrition (HCC) 07/29/2009   Coagulation defect, unspecified (HCC) 08/29/2006   Type 2 diabetes mellitus with diabetic peripheral angiopathy without gangrene (HCC) 08/29/2006   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 stage renal disease (HCC) 08/02/2006    PCP: Darrow Bussing, MD  REFERRING PROVIDER: Darrow Bussing, MD  REFERRING DIAG: Diagnosis  M25.511 (ICD-10-CM) - Pain in right shoulder    THERAPY DIAG:  Muscle weakness (generalized)  Stiffness of right shoulder, not elsewhere classified  Chronic right shoulder pain  Chronic pain of right knee  Rationale for Evaluation and Treatment: Rehabilitation  ONSET DATE: Rt reverse total shoulder: 08/2020, pain began 1 month  ago  SUBJECTIVE:  SUBJECTIVE STATEMENT: Pt reports he is stiff and sore.    Hand dominance: Right  PERTINENT HISTORY: Sleep apnea, dialysis M/W/F, Lt total hip replacement (11/2020), Rt total shoulder (08/2020)  PAIN:  Are you having pain? Yes: NPRS scale: 4-5/10 Pain location: Rt shoulder and scapula into Lt scapula Pain description: aching  Aggravating factors: using the arm, driving, sleep  Relieving factors: heating pad, sometimes medication  PRECAUTIONS: Fall and Other: hemodialysis on M,W, and F  RED FLAGS: None   WEIGHT BEARING RESTRICTIONS: No  FALLS:  Has patient fallen in last 6 months? No  LIVING ENVIRONMENT: Lives with: lives with their family Lives in: House/apartment  OCCUPATION: None  PLOF: Independent with basic ADLs and Leisure: walking for exercise  PATIENT GOALS: reduce shoulder pain, use Rt UE without limitation, sleep without interruption   NEXT MD VISIT: none   OBJECTIVE:  Note: Objective measures were completed at Evaluation unless otherwise noted.  DIAGNOSTIC FINDINGS:  Rt shoulder 12/21/22: Well-seated reverse shoulder replacement without complication   PATIENT SURVEYS :  12/26/22: Neldon Mc 57  01/23/23: Quick Dash 38  COGNITION: Overall cognitive status: Within functional limits for tasks assessed     SENSATION: WFL  POSTURE: Posterior pelvic tilt, significant trunk flexion, rounded shoulders and forward head  UPPER EXTREMITY ROM:   Active ROM Right eval Left eval  Shoulder flexion 80 (145 supine) Limited by 25% throughout   Shoulder extension 55   Shoulder abduction 60 (75)    Shoulder adduction    Shoulder internal rotation Full (supine)   Shoulder external rotation 35 (supine)   Elbow flexion    Elbow extension    Wrist flexion    Wrist  extension    Wrist ulnar deviation    Wrist radial deviation    Wrist pronation    Wrist supination    (Blank rows = not tested)  UPPER EXTREMITY MMT:  MMT Right eval Left eval  Shoulder flexion 3- 4-  Shoulder extension 4 4+  Shoulder abduction 3- 4-  Shoulder adduction    Shoulder internal rotation 4- 4+  Shoulder external rotation 3- 4  Middle trapezius    Lower trapezius    Elbow flexion  4+  Elbow extension    Wrist flexion    Wrist extension    Wrist ulnar deviation    Wrist radial deviation    Wrist pronation    Wrist supination    Grip strength (lbs)    (Blank rows = not tested)    JOINT MOBILITY TESTING:  WFLs   PALPATION:  Diffuse palpable tenderness over Rt posterior shoulder and proximal biceps insertion    TODAY'S TREATMENT:                                                                                                                                         DATE: 01/23/2023 Quick Dash completed UBE x 5 min  fwd only Pulleys x 1 min flexion, scaption and IR Prone shoulder extension with 2 lbs 2 x 10 Prone shoulder row with 2 lbs 2 x 10 Prone shoulder horizontal abd with 2 lbs 2 x 10 Side lying ER (started with 2lbs but dropped to 0 due to limited strength and ROM)  2 x 10  Supine Serratus punch x 20 with 2 lbs Supine shoulder alphabet A-Z with 2 lbs  Instructed patient in supine AA shoulder ER with cane for home PROM all planes of motion in supine x 13 min   DATE: 01/15/2023 Nustep level 5 x6 min with PT present to discuss status Standing shoulder wall wash with towel for right shoulder flexion 2x15 Standing shoulder wall wash with towel for right shoulder scaption 2x10 Standing 4D ball roll x20 each right UE Supine shoulder flexion with 2.5# on cane for AA/ROM 2x10 Supine chest press with 2.5# on cane 2x10 Supine serratus punch with 2# dumbbell 2x10 bilat Supine tracing of alphabet with 2# dumbbell A-Z right UE Supine shoulder ER with red  tband 2x10 Supine shoulder horizontal abduction with red tband 2x10   DATE: 01/02/2023 Nustep level 4 x6 min with PT present to discuss status Standing shoulder wall wash with towel for right shoulder flexion and scaption 2x10 each Standing 4D ball roll x20 each right UE Supine shoulder flexion with 2# on cane for AA/ROM 2x10 Supine chest press with 2# on cane 2x10 Supine shoulder ER with red tband 2x10 Supine shoulder horizontal abduction with red tband 2x10 Supine serratus punch with 2# dumbbell 2x10 RUE Supine tracing of alphabet with 2# dumbbell A-Z right UE     PATIENT EDUCATION: Education details: Access Code: [email or web address] Person educated: Patient Education method: Explanation, Demonstration, and Handouts Education comprehension: verbalized understanding and returned demonstration  HOME EXERCISE PROGRAM: Access Code: [email or web address] URL: [email or web address] Date: 01/15/2023 Prepared by: Clydie Braun Menke  Exercises - Shoulder Flexion Wall Slide with Towel  - 1-2 x daily - 7 x weekly - 2 sets - 10 reps - Supine Shoulder Flexion with Free Weight  - 2 x daily - 7 x weekly - 2 sets - 10 reps - Supine Shoulder Flexion AAROM  - 2 x daily - 7 x weekly - 1 sets - 10 reps - 10 hold - Sidelying Shoulder External Rotation  - 1 x daily - 7 x weekly - 3 sets - 10 reps - Shoulder External Rotation and Scapular Retraction with Resistance  - 1 x daily - 7 x weekly - 2 sets - 10 reps - Standing Shoulder Horizontal Abduction with Resistance  - 1 x daily - 7 x weekly - 2 sets - 10 reps  ASSESSMENT:  CLINICAL IMPRESSION: Mr Dimartino is progressing appropriately.  He is limited in shoulder ER so we added AA ER with cane for home.  He is also extremely weak in ER on right shoulder.  He would benefit from continued skilled PT for shoulder strengthening and scapular stabilization.     OBJECTIVE IMPAIRMENTS: decreased activity tolerance, decreased ROM, decreased strength, increased muscle  spasms, impaired flexibility, postural dysfunction, and pain.   ACTIVITY LIMITATIONS: carrying, lifting, and reach over head  PARTICIPATION LIMITATIONS: meal prep, cleaning, laundry, and driving  PERSONAL FACTORS: Past/current experiences, Time since onset of injury/illness/exacerbation, and 1-2 comorbidities: Rt shoulder replacement, dialysis   are also affecting patient's functional outcome.   REHAB POTENTIAL: Good  CLINICAL DECISION MAKING: Stable/uncomplicated  EVALUATION COMPLEXITY: Low  GOALS: Goals reviewed with patient? Yes  SHORT TERM  GOALS: Target date: 01/23/2023    Be independent in initial HEP Baseline: Goal status: MET  2.  Improve DASH to < or = to 48  Baseline: 59 Goal status: INITIAL  3.  Demonstrate Rt shoulder A/ROM flexion to > or = to 90 to improve reaching  Baseline:  Goal status: INITIAL  4.  Report > or = to 25% fewer sleep interruptions due to Rt shoulder pain Baseline: waking 3-4x/night  Goal status: INITIAL  LONG TERM GOALS: Target date: 02/20/2023    Be independent in advanced HEP Baseline:  Goal status: INITIAL  2.  Improve DASH to < or = to 40  Baseline: 59 Goal status: INITIAL  3.  Report > or = to 50% fewer sleep interruptions due to Rt shoulder pain Baseline:  Goal status: INITIAL  4.   Demonstrate Rt shoulder A/ROM flexion to > or = to 105 to improve reaching Baseline: 80 Goal status: INITIAL  5.  Report > or = to 50% reduction in Rt shoulder pain with use with ADLs and self-care Baseline:  Goal status: INITIAL   PLAN: PT FREQUENCY: 2x/week  PT DURATION: 8 weeks  PLANNED INTERVENTIONS: 97110-Therapeutic exercises, 97530- Therapeutic activity, [id number]- Neuromuscular re-education, 97535- Self Care, [id number]- Manual therapy, [id number]- Aquatic Therapy, [id number]- Electrical stimulation (unattended), [id number]- Electrical stimulation (manual), [id number]- Ionotophoresis 4mg /ml Dexamethasone, Patient/Family education, Dry Needling, Joint  mobilization, Joint manipulation, Cryotherapy, and Moist heat  PLAN FOR NEXT SESSION: Remeasure ROM, UBE, 3 way scap stab, 4D ball rolls, reaching overhead, strengthening   Reham Slabaugh B. Dacari Beckstrand, PT 01/23/23 1:11 PM [facility] [address] [place], [place] [address] Phone # [phone] Fax [phone]

## 2023-01-25 DIAGNOSIS — R197 Diarrhea, unspecified: Secondary | ICD-10-CM | POA: Diagnosis not present

## 2023-01-25 DIAGNOSIS — N186 End stage renal disease: Secondary | ICD-10-CM | POA: Diagnosis not present

## 2023-01-25 DIAGNOSIS — D689 Coagulation defect, unspecified: Secondary | ICD-10-CM | POA: Diagnosis not present

## 2023-01-25 DIAGNOSIS — D631 Anemia in chronic kidney disease: Secondary | ICD-10-CM | POA: Diagnosis not present

## 2023-01-25 DIAGNOSIS — E875 Hyperkalemia: Secondary | ICD-10-CM | POA: Diagnosis not present

## 2023-01-25 DIAGNOSIS — Z992 Dependence on renal dialysis: Secondary | ICD-10-CM | POA: Diagnosis not present

## 2023-01-25 DIAGNOSIS — N2581 Secondary hyperparathyroidism of renal origin: Secondary | ICD-10-CM | POA: Diagnosis not present

## 2023-01-28 DIAGNOSIS — E875 Hyperkalemia: Secondary | ICD-10-CM | POA: Diagnosis not present

## 2023-01-28 DIAGNOSIS — R197 Diarrhea, unspecified: Secondary | ICD-10-CM | POA: Diagnosis not present

## 2023-01-28 DIAGNOSIS — D689 Coagulation defect, unspecified: Secondary | ICD-10-CM | POA: Diagnosis not present

## 2023-01-28 DIAGNOSIS — Z992 Dependence on renal dialysis: Secondary | ICD-10-CM | POA: Diagnosis not present

## 2023-01-28 DIAGNOSIS — N186 End stage renal disease: Secondary | ICD-10-CM | POA: Diagnosis not present

## 2023-01-28 DIAGNOSIS — D631 Anemia in chronic kidney disease: Secondary | ICD-10-CM | POA: Diagnosis not present

## 2023-01-28 DIAGNOSIS — N2581 Secondary hyperparathyroidism of renal origin: Secondary | ICD-10-CM | POA: Diagnosis not present

## 2023-01-30 ENCOUNTER — Ambulatory Visit: Payer: 59

## 2023-01-30 DIAGNOSIS — R197 Diarrhea, unspecified: Secondary | ICD-10-CM | POA: Diagnosis not present

## 2023-01-30 DIAGNOSIS — D631 Anemia in chronic kidney disease: Secondary | ICD-10-CM | POA: Diagnosis not present

## 2023-01-30 DIAGNOSIS — Z992 Dependence on renal dialysis: Secondary | ICD-10-CM | POA: Diagnosis not present

## 2023-01-30 DIAGNOSIS — N2581 Secondary hyperparathyroidism of renal origin: Secondary | ICD-10-CM | POA: Diagnosis not present

## 2023-01-30 DIAGNOSIS — N186 End stage renal disease: Secondary | ICD-10-CM | POA: Diagnosis not present

## 2023-01-30 DIAGNOSIS — D689 Coagulation defect, unspecified: Secondary | ICD-10-CM | POA: Diagnosis not present

## 2023-01-30 DIAGNOSIS — E875 Hyperkalemia: Secondary | ICD-10-CM | POA: Diagnosis not present

## 2023-01-31 ENCOUNTER — Ambulatory Visit: Payer: 59

## 2023-01-31 DIAGNOSIS — M25511 Pain in right shoulder: Secondary | ICD-10-CM | POA: Diagnosis not present

## 2023-01-31 DIAGNOSIS — M25611 Stiffness of right shoulder, not elsewhere classified: Secondary | ICD-10-CM

## 2023-01-31 DIAGNOSIS — M25561 Pain in right knee: Secondary | ICD-10-CM | POA: Diagnosis not present

## 2023-01-31 DIAGNOSIS — G8929 Other chronic pain: Secondary | ICD-10-CM | POA: Diagnosis not present

## 2023-01-31 DIAGNOSIS — M6281 Muscle weakness (generalized): Secondary | ICD-10-CM

## 2023-01-31 NOTE — Therapy (Signed)
OUTPATIENT PHYSICAL THERAPY TREATMENT NOTE   Patient Name: Carl Escobedo Sr. MRN: [id number] DOB:Jun 03, 1960, 62 y.o., male Today's Date: 01/31/2023  END OF SESSION:  PT End of Session - 01/31/23 0844     Visit Number 5    Date for PT Re-Evaluation 02/20/23    Authorization Type U[employer] dual complete    Progress Note Due on Visit 10    PT Start Time 0820    PT Stop Time 0900    PT Time Calculation (min) 40 min    Activity Tolerance Patient tolerated treatment well    Behavior During Therapy WFL for tasks assessed/performed              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    Parathyroid abnormality (HCC)    ectopic parathyroid gland   Presence of arteriovenous fistula for hemodialysis, primary (HCC)    RUE PER PT RLE   Refusal of blood product    NO WHOLE BLOOD PROUCTS   Renal cell carcinoma (HCC)    s/p hand assisted laparoscopic bilateral nephrectomies 11/29/17, + RCC left   Secondary hyperparathyroidism (HCC)    Seizures (HCC)    one episode in past, due to" elevated Potassium" 08/02/20- "at least 4 years ago"   Sleep apnea    doesn't use CPAP anymore since weight loss   Stroke [facility])    no residual   Past Surgical History:  Procedure Laterality Date   AV FISTULA PLACEMENT Right    right arm   BIOPSY  08/01/2019   Procedure: BIOPSY;  Surgeon: Charna Elizabeth, MD;  Location: [facility] ENDOSCOPY;  Service: Endoscopy;;   BIOPSY  11/18/2020    Procedure: BIOPSY;  Surgeon: Jenel Lucks, MD;  Location: [facility] ENDOSCOPY;  Service: Gastroenterology;;   BIOPSY  09/13/2021   Procedure: BIOPSY;  Surgeon: Lemar Lofty., MD;  Location: [facility] ENDOSCOPY;  Service: Gastroenterology;;   CATARACT EXTRACTION W/ INTRAOCULAR LENS  IMPLANT, BILATERAL     COLON SURGERY     COLONOSCOPY N/A 08/04/2015   Procedure: COLONOSCOPY;  Surgeon: Beverley Fiedler, MD;  Location: [facility] ENDOSCOPY;  Service: Endoscopy;  Laterality: N/A;   COLONOSCOPY  2017   JMP@ [facility]-good prep-mass -recall 1 yr   COLONOSCOPY N/A 09/13/2021   Procedure: COLONOSCOPY;  Surgeon: Mansouraty, Netty Starring., MD;  Location: [facility] ENDOSCOPY;  Service: Gastroenterology;  Laterality: N/A;   COLONOSCOPY WITH PROPOFOL N/A 11/25/2020   Procedure: COLONOSCOPY WITH PROPOFOL;  Surgeon: Imogene Burn, MD;  Location: [facility] ENDOSCOPY;  Service: Gastroenterology;  Laterality: N/A;   COLONOSCOPY WITH PROPOFOL N/A 09/23/2021   Procedure: COLONOSCOPY WITH PROPOFOL;  Surgeon: Shellia Cleverly, DO;  Location: [facility] ENDOSCOPY;  Service: Gastroenterology;  Laterality: N/A;   ENTEROSCOPY N/A 09/23/2021   Procedure: ENTEROSCOPY;  Surgeon: Shellia Cleverly,  DO;  Location: [facility] ENDOSCOPY;  Service: Gastroenterology;  Laterality: N/A;  Will place order for video capsule study as we may opt to place it during procedure   ESOPHAGOGASTRODUODENOSCOPY N/A 08/01/2019   Procedure: ESOPHAGOGASTRODUODENOSCOPY (EGD);  Surgeon: Charna Elizabeth, MD;  Location: [facility] ENDOSCOPY;  Service: Endoscopy;  Laterality: N/A;   ESOPHAGOGASTRODUODENOSCOPY N/A 11/18/2020   Procedure: ESOPHAGOGASTRODUODENOSCOPY (EGD);  Surgeon: Jenel Lucks, MD;  Location: [facility] ENDOSCOPY;  Service: Gastroenterology;  Laterality: N/A;   ESOPHAGOGASTRODUODENOSCOPY (EGD) WITH PROPOFOL N/A 08/04/2019   Procedure: ESOPHAGOGASTRODUODENOSCOPY (EGD) WITH PROPOFOL;  Surgeon: Benancio Deeds, MD;  Location: [facility] ENDOSCOPY;  Service: Gastroenterology;  Laterality: N/A;    ESOPHAGOGASTRODUODENOSCOPY (EGD) WITH PROPOFOL N/A 09/13/2021   Procedure: ESOPHAGOGASTRODUODENOSCOPY (EGD) WITH PROPOFOL;  Surgeon: Meridee Score Netty Starring., MD;  Location: [facility] ENDOSCOPY;  Service: Gastroenterology;  Laterality: N/A;   GIVENS CAPSULE STUDY N/A 09/23/2021   Procedure: GIVENS CAPSULE STUDY;  Surgeon: Shellia Cleverly, DO;  Location: [facility] ENDOSCOPY;  Service: Gastroenterology;  Laterality: N/A;   graft left arm Left    for dialysis x 2. Removed   HOT HEMOSTASIS  11/18/2020   Procedure: HOT HEMOSTASIS (ARGON PLASMA COAGULATION/BICAP);  Surgeon: Jenel Lucks, MD;  Location: [facility] ENDOSCOPY;  Service: Gastroenterology;;   HOT HEMOSTASIS N/A 09/23/2021   Procedure: HOT HEMOSTASIS (ARGON PLASMA COAGULATION/BICAP);  Surgeon: Shellia Cleverly, DO;  Location: [facility] ENDOSCOPY;  Service: Gastroenterology;  Laterality: N/A;   INSERTION OF DIALYSIS CATHETER     Rt chest   LAPAROSCOPIC RIGHT COLECTOMY N/A 08/05/2015   Procedure: LAPAROSCOPIC RIGHT COLECTOMY- ASCENDING;  Surgeon: Almond Lint, MD;  Location: [facility] OR;  Service: General;  Laterality: N/A;   MASS EXCISION Left 05/28/2019   Procedure: EXCISION SOFT TISSUE MASS LEFT SHOULDER;  Surgeon: Darnell Level, MD;  Location: [facility] ORS;  Service: General;  Laterality: Left;   NEPHRECTOMY Bilateral    PARATHYROIDECTOMY N/A 06/12/2016   Procedure: TOTAL PARATHYROIDECTOMY WITH AUTOTRANSPLANTATION TO LEFT FOREARM;  Surgeon: Darnell Level, MD;  Location: [facility] OR;  Service: General;  Laterality: N/A;   PARATHYROIDECTOMY N/A 10/23/2016   Procedure: PARATHYROIDECTOMY;  Surgeon: Darnell Level, MD;  Location: [facility] OR;  Service: General;  Laterality: N/A;   REVERSE SHOULDER ARTHROPLASTY Right 08/24/2020   Procedure: REVERSE SHOULDER ARTHROPLASTY;  Surgeon: Bjorn Pippin, MD;  Location: [facility] OR;  Service: Orthopedics;  Laterality: Right;   REVISON OF ARTERIOVENOUS FISTULA Right 07/16/2017   Procedure: REVISION OF ARTERIOVENOUS FISTULA  Right ARM;  Surgeon: Maeola Harman, MD;  Location: [facility] OR;  Service: Vascular;  Laterality: Right;   TOTAL HIP ARTHROPLASTY Left 11/14/2020   Procedure: LEFT TOTAL HIP ARTHROPLASTY ANTERIOR APPROACH;  Surgeon: Tarry Kos, MD;  Location: [facility] OR;  Service: Orthopedics;  Laterality: Left;   UPPER GASTROINTESTINAL ENDOSCOPY  2021   @ [facility]   Patient Active Problem List   Diagnosis Date Noted   Duodenitis    Gastritis and gastroduodenitis    Ischemic colitis (HCC)    Bright red blood per rectum    ABLA (acute blood loss anemia)    Gastric ulcer    Duodenal ulcer    Diverticulosis of colon without hemorrhage    GI bleed 09/17/2021   Acute upper GI bleeding 09/11/2021   Mucosal abnormality of esophagus    Mucosal abnormality of stomach    Mucosal abnormality of duodenum    Upper GI bleed    Left hip pain 11/16/2020   Type 2 diabetes mellitus with other specified complication (HCC) 11/14/2020   Status  post total replacement of left hip 11/14/2020   Primary osteoarthritis of left hip 10/07/2020   Status post reverse total arthroplasty of right shoulder 08/24/2020   Diverticulitis 03/08/2020   ESRD (end stage renal disease) (HCC) 03/08/2020   Polyneuropathy in diseases classified elsewhere (HCC) 03/08/2020   Pure hypercholesterolemia 03/08/2020   Sacral back pain 03/08/2020   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 11/10/2019   Acute gastric ulcer with hemorrhage    Acute GI bleeding 07/31/2019   Hypotension of hemodialysis 07/31/2019   Diverticulitis large intestine 07/31/2019   Mass of soft tissue of shoulder 05/26/2019   Awaiting organ transplant status 03/18/2019   Finding of cocaine in blood 02/25/2019   Anaphylactic shock, unspecified, sequela 11/21/2018   Hypercalcemia 10/28/2018   Diarrhea, unspecified 08/08/2018   Hyperkalemia 03/12/2018   Renal cell carcinoma of left kidney (HCC) 12/24/2017   Fluid overload, unspecified 07/06/2017   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   Other  specified disorders of kidney and ureter 11/22/2016   Secondary hyperparathyroidism of renal origin (HCC) 10/23/2016   Headache disorder 08/21/2016   TIA (transient ischemic attack)    Essential hypertension    Seizures (HCC)    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    Lung nodule    Diverticulitis of cecum 07/21/2015   Diverticulitis of large intestine without perforation or abscess without bleeding    Right lower quadrant pain    Obstructive sleep apnea 02/08/2015   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   Diverticulosis 07/13/2014   Lower GI bleed 07/12/2014   Refusal of blood transfusions as patient is Jehovah's Witness    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   HLD (hyperlipidemia)    PRES (posterior reversible encephalopathy syndrome)    History of lacunar cerebrovascular accident (CVA) 05/23/2014   Anemia 05/23/2014   GERD (gastroesophageal reflux disease) 12/09/2012   Gastrointestinal bleeding 11/12/2012   Melena 11/12/2012   Other psychoactive substance dependence, uncomplicated (HCC) 10/08/2012   Iron deficiency anemia, unspecified 06/10/2012   Moderate protein-calorie malnutrition (HCC) 07/29/2009   Coagulation defect, unspecified (HCC) 08/29/2006   Type 2 diabetes mellitus with diabetic peripheral angiopathy without gangrene (HCC) 08/29/2006   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 stage renal disease (HCC) 08/02/2006    PCP: Darrow Bussing, MD  REFERRING PROVIDER: Darrow Bussing, MD  REFERRING DIAG: Diagnosis  M25.511 (ICD-10-CM) - Pain in right shoulder    THERAPY DIAG:  Muscle weakness (generalized)  Stiffness of right shoulder, not elsewhere classified  Chronic right shoulder pain  Rationale for Evaluation and Treatment: Rehabilitation  ONSET DATE: Rt reverse total shoulder: 08/2020, pain began 1 month ago  SUBJECTIVE:  SUBJECTIVE STATEMENT: Late for appt due to Rt shoulder pain.    Hand dominance: Right  PERTINENT HISTORY: Sleep apnea, dialysis M/W/F, Lt total hip replacement (11/2020), Rt total shoulder (08/2020)  PAIN: 01/31/23: Are you having pain? Yes: NPRS scale: 9/10 Pain location: Rt shoulder and scapula into Lt scapula Pain description: aching, intense  Aggravating factors: using the arm, driving, sleep  Relieving factors: heating pad, sometimes medication  PRECAUTIONS: Fall and Other: hemodialysis on M,W, and F  RED FLAGS: None   WEIGHT BEARING RESTRICTIONS: No  FALLS:  Has patient fallen in last 6 months? No  LIVING ENVIRONMENT: Lives with: lives with their family Lives in: House/apartment  OCCUPATION: None  PLOF: Independent with basic ADLs and Leisure: walking for exercise  PATIENT GOALS: reduce shoulder pain, use Rt UE without limitation, sleep without interruption   NEXT MD VISIT: none   OBJECTIVE:  Note: Objective measures were completed at Evaluation unless otherwise noted.  DIAGNOSTIC FINDINGS:  Rt shoulder 12/21/22: Well-seated reverse shoulder replacement without complication   PATIENT SURVEYS :  12/26/22: Neldon Mc 59  01/23/23: Quick Dash 38  COGNITION: Overall cognitive status: Within functional limits for tasks assessed     SENSATION: WFL  POSTURE: Posterior pelvic tilt, significant trunk flexion, rounded shoulders and forward head  UPPER EXTREMITY ROM:   Active ROM Right eval Left eval  Shoulder flexion 80 (145 supine) Limited by 25% throughout   Shoulder extension 55   Shoulder abduction 60 (75)    Shoulder adduction    Shoulder internal rotation Full (supine)   Shoulder external rotation 35 (supine)   Elbow flexion    Elbow extension    Wrist flexion    Wrist extension    Wrist  ulnar deviation    Wrist radial deviation    Wrist pronation    Wrist supination    (Blank rows = not tested)  UPPER EXTREMITY MMT:  MMT Right eval Left eval  Shoulder flexion 3- 4-  Shoulder extension 4 4+  Shoulder abduction 3- 4-  Shoulder adduction    Shoulder internal rotation 4- 4+  Shoulder external rotation 3- 4  Middle trapezius    Lower trapezius    Elbow flexion  4+  Elbow extension    Wrist flexion    Wrist extension    Wrist ulnar deviation    Wrist radial deviation    Wrist pronation    Wrist supination    Grip strength (lbs)    (Blank rows = not tested)    JOINT MOBILITY TESTING:  WFLs   PALPATION:  Diffuse palpable tenderness over Rt posterior shoulder and proximal biceps insertion    TODAY'S TREATMENT:                                                                                                                                         DATE: 01/31/2023 UBE x 6 (3/3)-PT present  to discuss progress with pt Ball roll outs 3 ways  Supine Serratus punch x 20 with 2 lbs Supine shoulder alphabet A-Z with 2 lbs  Supine chest press and overhead flexion with cane 2x10 each  E-stim and heat to Rt shoulder x15 minutes-PT issued info regarding home TENS unit  DATE: 01/23/2023 Quick Dash completed UBE x 5 min fwd only Pulleys x 1 min flexion, scaption and IR Prone shoulder extension with 2 lbs 2 x 10 Prone shoulder row with 2 lbs 2 x 10 Prone shoulder horizontal abd with 2 lbs 2 x 10 Side lying ER (started with 2lbs but dropped to 0 due to limited strength and ROM)  2 x 10  Supine Serratus punch x 20 with 2 lbs Supine shoulder alphabet A-Z with 2 lbs  Instructed patient in supine AA shoulder ER with cane for home PROM all planes of motion in supine x 13 min   DATE: 01/15/2023 Nustep level 5 x6 min with PT present to discuss status Standing shoulder wall wash with towel for right shoulder flexion 2x15 Standing shoulder wall wash with towel for right  shoulder scaption 2x10 Standing 4D ball roll x20 each right UE Supine shoulder flexion with 2.5# on cane for AA/ROM 2x10 Supine chest press with 2.5# on cane 2x10 Supine serratus punch with 2# dumbbell 2x10 bilat Supine tracing of alphabet with 2# dumbbell A-Z right UE Supine shoulder ER with red tband 2x10 Supine shoulder horizontal abduction with red tband 2x10   PATIENT EDUCATION: Education details: Access Code: [email or web address] Person educated: Patient Education method: Explanation, Demonstration, and Handouts Education comprehension: verbalized understanding and returned demonstration  HOME EXERCISE PROGRAM: Access Code: [email or web address] URL: [email or web address] Date: 01/15/2023 Prepared by: Clydie Braun Menke  Exercises - Shoulder Flexion Wall Slide with Towel  - 1-2 x daily - 7 x weekly - 2 sets - 10 reps - Supine Shoulder Flexion with Free Weight  - 2 x daily - 7 x weekly - 2 sets - 10 reps - Supine Shoulder Flexion AAROM  - 2 x daily - 7 x weekly - 1 sets - 10 reps - 10 hold - Sidelying Shoulder External Rotation  - 1 x daily - 7 x weekly - 3 sets - 10 reps - Shoulder External Rotation and Scapular Retraction with Resistance  - 1 x daily - 7 x weekly - 2 sets - 10 reps - Standing Shoulder Horizontal Abduction with Resistance  - 1 x daily - 7 x weekly - 2 sets - 10 reps  ASSESSMENT:  CLINICAL IMPRESSION: Pt arrived with significant Rt shoulder pain today and rates 9/10.  He was agreeable to participate in  gentle exercise.  Limited treatment due to being 20 min late. Trial of e-stim to address pain and PT issued info for TENS unit from [facility].  PT monitored throughout session.  Patient will benefit from skilled PT to address the below impairments and improve overall function.    OBJECTIVE IMPAIRMENTS: decreased activity tolerance, decreased ROM, decreased strength, increased muscle spasms, impaired flexibility, postural dysfunction, and pain.   ACTIVITY LIMITATIONS: carrying,  lifting, and reach over head  PARTICIPATION LIMITATIONS: meal prep, cleaning, laundry, and driving  PERSONAL FACTORS: Past/current experiences, Time since onset of injury/illness/exacerbation, and 1-2 comorbidities: Rt shoulder replacement, dialysis   are also affecting patient's functional outcome.   REHAB POTENTIAL: Good  CLINICAL DECISION MAKING: Stable/uncomplicated  EVALUATION COMPLEXITY: Low  GOALS: Goals reviewed with patient? Yes  SHORT TERM GOALS: Target date: 01/23/2023    Be independent  in initial HEP Baseline: Goal status: MET  2.  Improve DASH to < or = to 48  Baseline: 59 Goal status: INITIAL  3.  Demonstrate Rt shoulder A/ROM flexion to > or = to 90 to improve reaching  Baseline:  Goal status: INITIAL  4.  Report > or = to 25% fewer sleep interruptions due to Rt shoulder pain Baseline: waking 3-4x/night  Goal status: INITIAL  LONG TERM GOALS: Target date: 02/20/2023    Be independent in advanced HEP Baseline:  Goal status: INITIAL  2.  Improve DASH to < or = to 40  Baseline: 59 Goal status: INITIAL  3.  Report > or = to 50% fewer sleep interruptions due to Rt shoulder pain Baseline:  Goal status: INITIAL  4.   Demonstrate Rt shoulder A/ROM flexion to > or = to 105 to improve reaching Baseline: 80 Goal status: INITIAL  5.  Report > or = to 50% reduction in Rt shoulder pain with use with ADLs and self-care Baseline:  Goal status: INITIAL   PLAN: PT FREQUENCY: 2x/week  PT DURATION: 8 weeks  PLANNED INTERVENTIONS: 97110-Therapeutic exercises, 97530- Therapeutic activity, [id number]- Neuromuscular re-education, 97535- Self Care, 10932- Manual therapy, [id number]- Aquatic Therapy, 97014- Electrical stimulation (unattended), [id number]- Electrical stimulation (manual), [id number]- Ionotophoresis 4mg /ml Dexamethasone, Patient/Family education, Dry Needling, Joint mobilization, Joint manipulation, Cryotherapy, and Moist heat  PLAN FOR NEXT SESSION: Remeasure ROM, UBE,  3 way scap stab, 4D ball rolls, reaching overhead, strengthening.  See how pt responded to e-stim    Lorrene Reid, PT 01/31/23 8:46 AM  [facility] [address] [place], [place] [address] Phone # [phone] Fax [phone]

## 2023-02-01 DIAGNOSIS — D689 Coagulation defect, unspecified: Secondary | ICD-10-CM | POA: Diagnosis not present

## 2023-02-01 DIAGNOSIS — R197 Diarrhea, unspecified: Secondary | ICD-10-CM | POA: Diagnosis not present

## 2023-02-01 DIAGNOSIS — N2581 Secondary hyperparathyroidism of renal origin: Secondary | ICD-10-CM | POA: Diagnosis not present

## 2023-02-01 DIAGNOSIS — D631 Anemia in chronic kidney disease: Secondary | ICD-10-CM | POA: Diagnosis not present

## 2023-02-01 DIAGNOSIS — Z992 Dependence on renal dialysis: Secondary | ICD-10-CM | POA: Diagnosis not present

## 2023-02-01 DIAGNOSIS — N186 End stage renal disease: Secondary | ICD-10-CM | POA: Diagnosis not present

## 2023-02-01 DIAGNOSIS — E875 Hyperkalemia: Secondary | ICD-10-CM | POA: Diagnosis not present

## 2023-02-03 DIAGNOSIS — D631 Anemia in chronic kidney disease: Secondary | ICD-10-CM | POA: Diagnosis not present

## 2023-02-03 DIAGNOSIS — Z992 Dependence on renal dialysis: Secondary | ICD-10-CM | POA: Diagnosis not present

## 2023-02-03 DIAGNOSIS — N2581 Secondary hyperparathyroidism of renal origin: Secondary | ICD-10-CM | POA: Diagnosis not present

## 2023-02-03 DIAGNOSIS — D689 Coagulation defect, unspecified: Secondary | ICD-10-CM | POA: Diagnosis not present

## 2023-02-03 DIAGNOSIS — N186 End stage renal disease: Secondary | ICD-10-CM | POA: Diagnosis not present

## 2023-02-03 DIAGNOSIS — E875 Hyperkalemia: Secondary | ICD-10-CM | POA: Diagnosis not present

## 2023-02-03 DIAGNOSIS — R197 Diarrhea, unspecified: Secondary | ICD-10-CM | POA: Diagnosis not present

## 2023-02-04 ENCOUNTER — Ambulatory Visit: Payer: 59

## 2023-02-04 DIAGNOSIS — G8929 Other chronic pain: Secondary | ICD-10-CM

## 2023-02-04 DIAGNOSIS — M25561 Pain in right knee: Secondary | ICD-10-CM | POA: Diagnosis not present

## 2023-02-04 DIAGNOSIS — M6281 Muscle weakness (generalized): Secondary | ICD-10-CM | POA: Diagnosis not present

## 2023-02-04 DIAGNOSIS — M25611 Stiffness of right shoulder, not elsewhere classified: Secondary | ICD-10-CM

## 2023-02-04 DIAGNOSIS — M25511 Pain in right shoulder: Secondary | ICD-10-CM | POA: Diagnosis not present

## 2023-02-04 NOTE — Therapy (Signed)
OUTPATIENT PHYSICAL THERAPY TREATMENT NOTE   Patient Name: Carl Legarda Sr. MRN: [id number] DOB:[birth year], 62 y.o., male Today's Date: 02/04/2023  END OF SESSION:  PT End of Session - 02/04/23 1226     Visit Number 6    Date for PT Re-Evaluation 02/20/23    Authorization Type [employer] dual complete    Progress Note Due on Visit 10    PT Start Time 1146    PT Stop Time 1225    PT Time Calculation (min) 39 min               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    Parathyroid abnormality (HCC)    ectopic parathyroid gland   Presence of arteriovenous fistula for hemodialysis, primary (HCC)    RUE PER PT RLE   Refusal of blood product    NO WHOLE BLOOD PROUCTS   Renal cell carcinoma (HCC)    s/p hand assisted laparoscopic bilateral nephrectomies 11/29/17, + RCC left   Secondary hyperparathyroidism (HCC)    Seizures (HCC)    one episode in past, due to" elevated Potassium" 08/02/20- "at least 4 years ago"   Sleep apnea    doesn't use CPAP anymore since weight loss   Stroke [facility])    no residual   Past Surgical History:  Procedure Laterality Date   AV FISTULA PLACEMENT Right    right arm   BIOPSY  08/01/2019   Procedure: BIOPSY;  Surgeon: Charna Elizabeth, MD;  Location: [facility] ENDOSCOPY;  Service: Endoscopy;;   BIOPSY  11/18/2020   Procedure: BIOPSY;  Surgeon: Jenel Lucks, MD;  Location: [facility] ENDOSCOPY;  Service: Gastroenterology;;   BIOPSY   09/13/2021   Procedure: BIOPSY;  Surgeon: Lemar Lofty., MD;  Location: [facility] ENDOSCOPY;  Service: Gastroenterology;;   CATARACT EXTRACTION W/ INTRAOCULAR LENS  IMPLANT, BILATERAL     COLON SURGERY     COLONOSCOPY N/A 08/04/2015   Procedure: COLONOSCOPY;  Surgeon: Beverley Fiedler, MD;  Location: [facility] ENDOSCOPY;  Service: Endoscopy;  Laterality: N/A;   COLONOSCOPY  2017   JMP@ [facility]-good prep-mass -recall 1 yr   COLONOSCOPY N/A 09/13/2021   Procedure: COLONOSCOPY;  Surgeon: Mansouraty, Netty Starring., MD;  Location: [facility] ENDOSCOPY;  Service: Gastroenterology;  Laterality: N/A;   COLONOSCOPY WITH PROPOFOL N/A 11/25/2020   Procedure: COLONOSCOPY WITH PROPOFOL;  Surgeon: Imogene Burn, MD;  Location: [facility] ENDOSCOPY;  Service: Gastroenterology;  Laterality: N/A;   COLONOSCOPY WITH PROPOFOL N/A 09/23/2021   Procedure: COLONOSCOPY WITH PROPOFOL;  Surgeon: Shellia Cleverly, DO;  Location: [facility] ENDOSCOPY;  Service: Gastroenterology;  Laterality: N/A;   ENTEROSCOPY N/A 09/23/2021   Procedure: ENTEROSCOPY;  Surgeon: Shellia Cleverly, DO;  Location: [facility] ENDOSCOPY;  Service: Gastroenterology;  Laterality: N/A;  Will place order for video capsule  study as we may opt to place it during procedure   ESOPHAGOGASTRODUODENOSCOPY N/A 08/01/2019   Procedure: ESOPHAGOGASTRODUODENOSCOPY (EGD);  Surgeon: Charna Elizabeth, MD;  Location: [facility] ENDOSCOPY;  Service: Endoscopy;  Laterality: N/A;   ESOPHAGOGASTRODUODENOSCOPY N/A 11/18/2020   Procedure: ESOPHAGOGASTRODUODENOSCOPY (EGD);  Surgeon: Jenel Lucks, MD;  Location: [facility] ENDOSCOPY;  Service: Gastroenterology;  Laterality: N/A;   ESOPHAGOGASTRODUODENOSCOPY (EGD) WITH PROPOFOL N/A 08/04/2019   Procedure: ESOPHAGOGASTRODUODENOSCOPY (EGD) WITH PROPOFOL;  Surgeon: Benancio Deeds, MD;  Location: [facility] ENDOSCOPY;  Service: Gastroenterology;  Laterality: N/A;   ESOPHAGOGASTRODUODENOSCOPY (EGD) WITH PROPOFOL N/A 09/13/2021   Procedure: ESOPHAGOGASTRODUODENOSCOPY (EGD) WITH PROPOFOL;   Surgeon: Meridee Score Netty Starring., MD;  Location: [facility] ENDOSCOPY;  Service: Gastroenterology;  Laterality: N/A;   GIVENS CAPSULE STUDY N/A 09/23/2021   Procedure: GIVENS CAPSULE STUDY;  Surgeon: Shellia Cleverly, DO;  Location: [facility] ENDOSCOPY;  Service: Gastroenterology;  Laterality: N/A;   graft left arm Left    for dialysis x 2. Removed   HOT HEMOSTASIS  11/18/2020   Procedure: HOT HEMOSTASIS (ARGON PLASMA COAGULATION/BICAP);  Surgeon: Jenel Lucks, MD;  Location: [facility] ENDOSCOPY;  Service: Gastroenterology;;   HOT HEMOSTASIS N/A 09/23/2021   Procedure: HOT HEMOSTASIS (ARGON PLASMA COAGULATION/BICAP);  Surgeon: Shellia Cleverly, DO;  Location: [facility] ENDOSCOPY;  Service: Gastroenterology;  Laterality: N/A;   INSERTION OF DIALYSIS CATHETER     Rt chest   LAPAROSCOPIC RIGHT COLECTOMY N/A 08/05/2015   Procedure: LAPAROSCOPIC RIGHT COLECTOMY- ASCENDING;  Surgeon: Almond Lint, MD;  Location: [facility] OR;  Service: General;  Laterality: N/A;   MASS EXCISION Left 05/28/2019   Procedure: EXCISION SOFT TISSUE MASS LEFT SHOULDER;  Surgeon: Darnell Level, MD;  Location: [facility] ORS;  Service: General;  Laterality: Left;   NEPHRECTOMY Bilateral    PARATHYROIDECTOMY N/A 06/12/2016   Procedure: TOTAL PARATHYROIDECTOMY WITH AUTOTRANSPLANTATION TO LEFT FOREARM;  Surgeon: Darnell Level, MD;  Location: [facility] OR;  Service: General;  Laterality: N/A;   PARATHYROIDECTOMY N/A 10/23/2016   Procedure: PARATHYROIDECTOMY;  Surgeon: Darnell Level, MD;  Location: [facility] OR;  Service: General;  Laterality: N/A;   REVERSE SHOULDER ARTHROPLASTY Right 08/24/2020   Procedure: REVERSE SHOULDER ARTHROPLASTY;  Surgeon: Bjorn Pippin, MD;  Location: [facility] OR;  Service: Orthopedics;  Laterality: Right;   REVISON OF ARTERIOVENOUS FISTULA Right 07/16/2017   Procedure: REVISION OF ARTERIOVENOUS FISTULA  Right ARM;  Surgeon: Maeola Harman, MD;  Location: [facility] OR;  Service: Vascular;  Laterality: Right;   TOTAL HIP ARTHROPLASTY Left 11/14/2020    Procedure: LEFT TOTAL HIP ARTHROPLASTY ANTERIOR APPROACH;  Surgeon: Tarry Kos, MD;  Location: [facility] OR;  Service: Orthopedics;  Laterality: Left;   UPPER GASTROINTESTINAL ENDOSCOPY  2021   @ [facility]   Patient Active Problem List   Diagnosis Date Noted   Duodenitis    Gastritis and gastroduodenitis    Ischemic colitis (HCC)    Bright red blood per rectum    ABLA (acute blood loss anemia)    Gastric ulcer    Duodenal ulcer    Diverticulosis of colon without hemorrhage    GI bleed 09/17/2021   Acute upper GI bleeding 09/11/2021   Mucosal abnormality of esophagus    Mucosal abnormality of stomach    Mucosal abnormality of duodenum    Upper GI bleed    Left hip pain 11/16/2020   Type 2 diabetes mellitus with other specified complication (HCC) 11/14/2020   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/14/2020   Primary osteoarthritis of left hip 10/07/2020   Status  post reverse total arthroplasty of right shoulder 08/24/2020   Diverticulitis 03/08/2020   ESRD (end stage renal disease) (HCC) 03/08/2020   Polyneuropathy in diseases classified elsewhere (HCC) 03/08/2020   Pure hypercholesterolemia 03/08/2020   Sacral back pain 03/08/2020   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 11/10/2019   Acute gastric ulcer with hemorrhage    Acute GI bleeding 07/31/2019   Hypotension of hemodialysis 07/31/2019   Diverticulitis large intestine 07/31/2019   Mass of soft tissue of shoulder 05/26/2019   Awaiting organ transplant status 03/18/2019   Finding of cocaine in blood 02/25/2019   Anaphylactic shock, unspecified, sequela 11/21/2018   Hypercalcemia 10/28/2018   Diarrhea, unspecified 08/08/2018   Hyperkalemia 03/12/2018   Renal cell carcinoma of left kidney (HCC) 12/24/2017   Fluid overload, unspecified 07/06/2017   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   Other specified disorders of kidney and ureter 11/22/2016   Secondary hyperparathyroidism of renal origin (HCC) 10/23/2016    Headache disorder 08/21/2016   TIA (transient ischemic attack)    Essential hypertension    Seizures (HCC)    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    Lung nodule    Diverticulitis of cecum 07/21/2015   Diverticulitis of large intestine without perforation or abscess without bleeding    Right lower quadrant pain    Obstructive sleep apnea 02/08/2015   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   Diverticulosis 07/13/2014   Lower GI bleed 07/12/2014   Refusal of blood transfusions as patient is Jehovah's Witness    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   HLD (hyperlipidemia)    PRES (posterior reversible encephalopathy syndrome)    History of lacunar cerebrovascular accident (CVA) 05/23/2014   Anemia 05/23/2014   GERD (gastroesophageal reflux disease) 12/09/2012   Gastrointestinal bleeding 11/12/2012   Melena 11/12/2012   Other psychoactive substance dependence, uncomplicated (HCC) 10/08/2012   Iron deficiency anemia, unspecified 06/10/2012   Moderate protein-calorie malnutrition (HCC) 07/29/2009   Coagulation defect, unspecified (HCC) 08/29/2006   Type 2 diabetes mellitus with diabetic peripheral angiopathy without gangrene (HCC) 08/29/2006   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 stage renal disease (HCC) 08/02/2006    PCP: Darrow Bussing, MD  REFERRING PROVIDER: Darrow Bussing, MD  REFERRING DIAG: Diagnosis  M25.511 (ICD-10-CM) - Pain in right shoulder    THERAPY DIAG:  Muscle weakness (generalized)  Stiffness of right shoulder, not elsewhere classified  Chronic right shoulder pain  Rationale for Evaluation and Treatment: Rehabilitation  ONSET DATE: Rt reverse total shoulder: 08/2020, pain began 1 month ago  SUBJECTIVE:  SUBJECTIVE STATEMENT: The e-stim helped last session but pain returned when the machine was off.  I am going to have my son order at TENs unit.   Hand dominance: Right  PERTINENT HISTORY: Sleep apnea, dialysis M/W/F, Lt total hip replacement (11/2020), Rt total shoulder (08/2020)  PAIN: 02/04/23: Are you having pain? Yes: NPRS scale: 7/10 Pain location: Rt shoulder and scapula into Lt scapula Pain description: aching, intense  Aggravating factors: using the arm, driving, sleep  Relieving factors: heating pad, sometimes medication  PRECAUTIONS: Fall and Other: hemodialysis on M,W, and F  RED FLAGS: None   WEIGHT BEARING RESTRICTIONS: No  FALLS:  Has patient fallen in last 6 months? No  LIVING ENVIRONMENT: Lives with: lives with their family Lives in: House/apartment  OCCUPATION: None  PLOF: Independent with basic ADLs and Leisure: walking for exercise  PATIENT GOALS: reduce shoulder pain, use Rt UE without limitation, sleep without interruption   NEXT MD VISIT: none   OBJECTIVE:  Note: Objective measures were completed at Evaluation unless otherwise noted.  DIAGNOSTIC FINDINGS:  Rt shoulder 12/21/22: Well-seated reverse shoulder replacement without complication   PATIENT SURVEYS :  12/26/22: Nino Parsley Dash 59  01/23/23: Quick Dash 38  COGNITION: Overall cognitive status: Within functional limits for tasks assessed     SENSATION: WFL  POSTURE: Posterior pelvic tilt, significant trunk flexion, rounded shoulders and forward head  UPPER EXTREMITY ROM:   Active ROM Right eval Right  02/04/23 Left eval  Shoulder flexion 80 (145 supine) 75 Limited by 25% throughout   Shoulder extension 55    Shoulder abduction 60 (75)  60   Shoulder adduction     Shoulder internal rotation Full (supine)    Shoulder external rotation 35 (supine)    Elbow flexion     Elbow extension     Wrist flexion     Wrist extension     Wrist ulnar  deviation     Wrist radial deviation     Wrist pronation     Wrist supination     (Blank rows = not tested)  UPPER EXTREMITY MMT:  MMT Right eval Left eval  Shoulder flexion 3- 4-  Shoulder extension 4 4+  Shoulder abduction 3- 4-  Shoulder adduction    Shoulder internal rotation 4- 4+  Shoulder external rotation 3- 4  Middle trapezius    Lower trapezius    Elbow flexion  4+  Elbow extension    Wrist flexion    Wrist extension    Wrist ulnar deviation    Wrist radial deviation    Wrist pronation    Wrist supination    Grip strength (lbs)    (Blank rows = not tested)    JOINT MOBILITY TESTING:  WFLs   PALPATION:  Diffuse palpable tenderness over Rt posterior shoulder and proximal biceps insertion    TODAY'S TREATMENT:  DATE: 01/31/2023 UBE x 6 forward only-PT present to discuss progress with pt Overhead pulleys: flexion x3 min  Ball roll outs 3 ways x5  Seated Rt shoulder flexion: 1# 2x10 Supine Serratus punch x 20 with 2 lbs Supine shoulder alphabet A-Z with 2 lbs  Supine chest press(narrow and wide grip) with 4# 2x10 each  ER without weight 2x10 Side lying abduction 2x10  DATE: 01/23/2023 Quick Dash completed UBE x 5 min fwd only Pulleys x 1 min flexion, scaption and IR Prone shoulder extension with 2 lbs 2 x 10 Prone shoulder row with 2 lbs 2 x 10 Prone shoulder horizontal abd with 2 lbs 2 x 10 Side lying ER (started with 2lbs but dropped to 0 due to limited strength and ROM)  2 x 10  Supine Serratus punch x 20 with 2 lbs Supine shoulder alphabet A-Z with 2 lbs  Instructed patient in supine AA shoulder ER with cane for home PROM all planes of motion in supine x 13 min   DATE: 01/15/2023 Nustep level 5 x6 min with PT present to discuss status Standing shoulder wall wash with towel for right shoulder flexion  2x15 Standing shoulder wall wash with towel for right shoulder scaption 2x10 Standing 4D ball roll x20 each right UE Supine shoulder flexion with 2.5# on cane for AA/ROM 2x10 Supine chest press with 2.5# on cane 2x10 Supine serratus punch with 2# dumbbell 2x10 bilat Supine tracing of alphabet with 2# dumbbell A-Z right UE Supine shoulder ER with red tband 2x10 Supine shoulder horizontal abduction with red tband 2x10   PATIENT EDUCATION: Education details: Access Code: [email or web address] Person educated: Patient Education method: Explanation, Demonstration, and Handouts Education comprehension: verbalized understanding and returned demonstration  HOME EXERCISE PROGRAM: Access Code: [email or web address] URL: [email or web address] Date: 01/15/2023 Prepared by: Clydie Braun Menke  Exercises - Shoulder Flexion Wall Slide with Towel  - 1-2 x daily - 7 x weekly - 2 sets - 10 reps - Supine Shoulder Flexion with Free Weight  - 2 x daily - 7 x weekly - 2 sets - 10 reps - Supine Shoulder Flexion AAROM  - 2 x daily - 7 x weekly - 1 sets - 10 reps - 10 hold - Sidelying Shoulder External Rotation  - 1 x daily - 7 x weekly - 3 sets - 10 reps - Shoulder External Rotation and Scapular Retraction with Resistance  - 1 x daily - 7 x weekly - 2 sets - 10 reps - Standing Shoulder Horizontal Abduction with Resistance  - 1 x daily - 7 x weekly - 2 sets - 10 reps  ASSESSMENT:  CLINICAL IMPRESSION: Pt reports that electrical stimulation reduced his pain temporarily and pain returned shortly after his session.  Pt will order a home TENs to help manage pain at home.  Pt with continued 7/10 Rt shoulder pain with activity.  Pt will see MD next month to discuss continued pain.  No change in ROM of the Rt shoulder. Pt with functional strength and ROM limitations of his Rt shoulder.  He was able to participate in more activity today and  PT monitored throughout session.  Patient will benefit from skilled PT to address the below  impairments and improve overall function.    OBJECTIVE IMPAIRMENTS: decreased activity tolerance, decreased ROM, decreased strength, increased muscle spasms, impaired flexibility, postural dysfunction, and pain.   ACTIVITY LIMITATIONS: carrying, lifting, and reach over head  PARTICIPATION LIMITATIONS: meal prep, cleaning, laundry, and driving  PERSONAL FACTORS: Past/current experiences, Time since  onset of injury/illness/exacerbation, and 1-2 comorbidities: Rt shoulder replacement, dialysis   are also affecting patient's functional outcome.   REHAB POTENTIAL: Good  CLINICAL DECISION MAKING: Stable/uncomplicated  EVALUATION COMPLEXITY: Low  GOALS: Goals reviewed with patient? Yes  SHORT TERM GOALS: Target date: 01/23/2023    Be independent in initial HEP Baseline: Goal status: MET  2.  Improve DASH to < or = to 48  Baseline: 59 Goal status: INITIAL  3.  Demonstrate Rt shoulder A/ROM flexion to > or = to 90 to improve reaching  Baseline:  Goal status: INITIAL  4.  Report > or = to 25% fewer sleep interruptions due to Rt shoulder pain Baseline: waking 3-4x/night  Goal status: INITIAL  LONG TERM GOALS: Target date: 02/20/2023    Be independent in advanced HEP Baseline:  Goal status: INITIAL  2.  Improve DASH to < or = to 40  Baseline: 59 Goal status: INITIAL  3.  Report > or = to 50% fewer sleep interruptions due to Rt shoulder pain Baseline:  Goal status: INITIAL  4.   Demonstrate Rt shoulder A/ROM flexion to > or = to 105 to improve reaching Baseline: 80 (02/04/23) Goal status: IN PROGRESS  5.  Report > or = to 50% reduction in Rt shoulder pain with use with ADLs and self-care Baseline:  Goal status: INITIAL   PLAN: PT FREQUENCY: 2x/week  PT DURATION: 8 weeks  PLANNED INTERVENTIONS: 97110-Therapeutic exercises, 97530- Therapeutic activity, 97112- Neuromuscular re-education, 97535- Self Care, [id number]- Manual therapy, [id number]- Aquatic Therapy, [id number]-  Electrical stimulation (unattended), [id number]- Electrical stimulation (manual), [id number]- Ionotophoresis 4mg /ml Dexamethasone, Patient/Family education, Dry Needling, Joint mobilization, Joint manipulation, Cryotherapy, and Moist heat  PLAN FOR NEXT SESSION: Remeasure ROM, UBE, 3 way scap stab, 4D ball rolls, reaching overhead, strengthening.   Lorrene Reid, PT 02/04/23 12:27 PM  [facility] [address] [place], [place] [address] Phone # [phone] Fax [phone]

## 2023-02-05 DIAGNOSIS — R197 Diarrhea, unspecified: Secondary | ICD-10-CM | POA: Diagnosis not present

## 2023-02-05 DIAGNOSIS — D631 Anemia in chronic kidney disease: Secondary | ICD-10-CM | POA: Diagnosis not present

## 2023-02-05 DIAGNOSIS — E875 Hyperkalemia: Secondary | ICD-10-CM | POA: Diagnosis not present

## 2023-02-05 DIAGNOSIS — N2581 Secondary hyperparathyroidism of renal origin: Secondary | ICD-10-CM | POA: Diagnosis not present

## 2023-02-05 DIAGNOSIS — D689 Coagulation defect, unspecified: Secondary | ICD-10-CM | POA: Diagnosis not present

## 2023-02-05 DIAGNOSIS — N186 End stage renal disease: Secondary | ICD-10-CM | POA: Diagnosis not present

## 2023-02-05 DIAGNOSIS — Z992 Dependence on renal dialysis: Secondary | ICD-10-CM | POA: Diagnosis not present

## 2023-02-08 DIAGNOSIS — E875 Hyperkalemia: Secondary | ICD-10-CM | POA: Diagnosis not present

## 2023-02-08 DIAGNOSIS — N186 End stage renal disease: Secondary | ICD-10-CM | POA: Diagnosis not present

## 2023-02-08 DIAGNOSIS — N2581 Secondary hyperparathyroidism of renal origin: Secondary | ICD-10-CM | POA: Diagnosis not present

## 2023-02-08 DIAGNOSIS — R197 Diarrhea, unspecified: Secondary | ICD-10-CM | POA: Diagnosis not present

## 2023-02-08 DIAGNOSIS — D631 Anemia in chronic kidney disease: Secondary | ICD-10-CM | POA: Diagnosis not present

## 2023-02-08 DIAGNOSIS — Z992 Dependence on renal dialysis: Secondary | ICD-10-CM | POA: Diagnosis not present

## 2023-02-08 DIAGNOSIS — D689 Coagulation defect, unspecified: Secondary | ICD-10-CM | POA: Diagnosis not present

## 2023-02-10 DIAGNOSIS — Z992 Dependence on renal dialysis: Secondary | ICD-10-CM | POA: Diagnosis not present

## 2023-02-10 DIAGNOSIS — N2581 Secondary hyperparathyroidism of renal origin: Secondary | ICD-10-CM | POA: Diagnosis not present

## 2023-02-10 DIAGNOSIS — N186 End stage renal disease: Secondary | ICD-10-CM | POA: Diagnosis not present

## 2023-02-10 DIAGNOSIS — D689 Coagulation defect, unspecified: Secondary | ICD-10-CM | POA: Diagnosis not present

## 2023-02-10 DIAGNOSIS — E875 Hyperkalemia: Secondary | ICD-10-CM | POA: Diagnosis not present

## 2023-02-10 DIAGNOSIS — D631 Anemia in chronic kidney disease: Secondary | ICD-10-CM | POA: Diagnosis not present

## 2023-02-10 DIAGNOSIS — R197 Diarrhea, unspecified: Secondary | ICD-10-CM | POA: Diagnosis not present

## 2023-02-12 ENCOUNTER — Ambulatory Visit: Payer: 59

## 2023-02-12 DIAGNOSIS — D631 Anemia in chronic kidney disease: Secondary | ICD-10-CM | POA: Diagnosis not present

## 2023-02-12 DIAGNOSIS — D689 Coagulation defect, unspecified: Secondary | ICD-10-CM | POA: Diagnosis not present

## 2023-02-12 DIAGNOSIS — E875 Hyperkalemia: Secondary | ICD-10-CM | POA: Diagnosis not present

## 2023-02-12 DIAGNOSIS — M25561 Pain in right knee: Secondary | ICD-10-CM | POA: Diagnosis not present

## 2023-02-12 DIAGNOSIS — M25511 Pain in right shoulder: Secondary | ICD-10-CM | POA: Diagnosis not present

## 2023-02-12 DIAGNOSIS — M25611 Stiffness of right shoulder, not elsewhere classified: Secondary | ICD-10-CM

## 2023-02-12 DIAGNOSIS — Z992 Dependence on renal dialysis: Secondary | ICD-10-CM | POA: Diagnosis not present

## 2023-02-12 DIAGNOSIS — M6281 Muscle weakness (generalized): Secondary | ICD-10-CM

## 2023-02-12 DIAGNOSIS — N186 End stage renal disease: Secondary | ICD-10-CM | POA: Diagnosis not present

## 2023-02-12 DIAGNOSIS — G8929 Other chronic pain: Secondary | ICD-10-CM

## 2023-02-12 DIAGNOSIS — R197 Diarrhea, unspecified: Secondary | ICD-10-CM | POA: Diagnosis not present

## 2023-02-12 DIAGNOSIS — N2581 Secondary hyperparathyroidism of renal origin: Secondary | ICD-10-CM | POA: Diagnosis not present

## 2023-02-12 DIAGNOSIS — I12 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 stage renal disease: Secondary | ICD-10-CM | POA: Diagnosis not present

## 2023-02-12 NOTE — Therapy (Signed)
 OUTPATIENT PHYSICAL THERAPY TREATMENT NOTE   Patient Name: Carl Yeager Sr. MRN: [id number] DOB:[birth year], 62 y.o., male Today's Date: 02/12/2023  END OF SESSION:  PT End of Session - 02/12/23 1149     Visit Number 7    Date for PT Re-Evaluation 02/20/23    Authorization Type [employer] dual complete    Progress Note Due on Visit 10    PT Start Time 1117   late   PT Stop Time 1146    PT Time Calculation (min) 29 min    Activity Tolerance Patient tolerated treatment well    Behavior During Therapy WFL for tasks assessed/performed                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    Parathyroid  abnormality (HCC)    ectopic parathyroid  gland   Presence of arteriovenous fistula for hemodialysis, primary (HCC)    RUE PER PT RLE   Refusal of blood product    NO WHOLE BLOOD PROUCTS   Renal cell carcinoma (HCC)    s/p hand assisted laparoscopic bilateral nephrectomies 11/29/17, + RCC left   Secondary hyperparathyroidism (HCC)    Seizures (HCC)    one episode in past, due to elevated Potassium 08/02/20- at least 4 years ago   Sleep apnea    doesn't use CPAP anymore since weight loss   Stroke [facility])    no residual   Past Surgical History:  Procedure Laterality Date   AV FISTULA PLACEMENT Right    right arm   BIOPSY  08/01/2019   Procedure: BIOPSY;  Surgeon: Kristie Lamprey, MD;  Location: [facility] ENDOSCOPY;  Service: Endoscopy;;   BIOPSY   11/18/2020   Procedure: BIOPSY;  Surgeon: Stacia Glendia BRAVO, MD;  Location: [facility] ENDOSCOPY;  Service: Gastroenterology;;   BIOPSY  09/13/2021   Procedure: BIOPSY;  Surgeon: Wilhelmenia Aloha Raddle., MD;  Location: [facility] ENDOSCOPY;  Service: Gastroenterology;;   CATARACT EXTRACTION W/ INTRAOCULAR LENS  IMPLANT, BILATERAL     COLON SURGERY     COLONOSCOPY N/A 08/04/2015   Procedure: COLONOSCOPY;  Surgeon: Gordy CHRISTELLA Starch, MD;  Location: [facility] ENDOSCOPY;  Service: Endoscopy;  Laterality: N/A;   COLONOSCOPY  2017   JMP@ [facility]-good prep-mass -recall 1 yr   COLONOSCOPY N/A 09/13/2021   Procedure: COLONOSCOPY;  Surgeon: Mansouraty, Aloha Raddle., MD;  Location: [facility] ENDOSCOPY;  Service: Gastroenterology;  Laterality: N/A;   COLONOSCOPY WITH PROPOFOL  N/A 11/25/2020   Procedure: COLONOSCOPY WITH PROPOFOL ;  Surgeon: Federico Rosario BROCKS, MD;  Location: [facility] ENDOSCOPY;  Service: Gastroenterology;  Laterality: N/A;   COLONOSCOPY WITH PROPOFOL  N/A 09/23/2021   Procedure: COLONOSCOPY WITH PROPOFOL ;  Surgeon: San Sandor GAILS, DO;  Location: [facility] ENDOSCOPY;  Service: Gastroenterology;  Laterality: N/A;   ENTEROSCOPY N/A 09/23/2021   Procedure: ENTEROSCOPY;  Surgeon: San Sandor GAILS, DO;  Location: [facility] ENDOSCOPY;  Service: Gastroenterology;  Laterality: N/A;  Will place order for video capsule study as we may opt to place it during procedure   ESOPHAGOGASTRODUODENOSCOPY N/A 08/01/2019   Procedure: ESOPHAGOGASTRODUODENOSCOPY (EGD);  Surgeon: Kristie Lamprey, MD;  Location: [facility] ENDOSCOPY;  Service: Endoscopy;  Laterality: N/A;   ESOPHAGOGASTRODUODENOSCOPY N/A 11/18/2020   Procedure: ESOPHAGOGASTRODUODENOSCOPY (EGD);  Surgeon: Stacia Glendia BRAVO, MD;  Location: [facility] ENDOSCOPY;  Service: Gastroenterology;  Laterality: N/A;   ESOPHAGOGASTRODUODENOSCOPY (EGD) WITH PROPOFOL  N/A 08/04/2019   Procedure: ESOPHAGOGASTRODUODENOSCOPY (EGD) WITH PROPOFOL ;  Surgeon: Leigh Elspeth SQUIBB, MD;  Location: [facility] ENDOSCOPY;  Service: Gastroenterology;  Laterality:  N/A;   ESOPHAGOGASTRODUODENOSCOPY (EGD) WITH PROPOFOL  N/A 09/13/2021   Procedure: ESOPHAGOGASTRODUODENOSCOPY (EGD) WITH PROPOFOL ;  Surgeon: Wilhelmenia Aloha Raddle., MD;  Location: [facility] ENDOSCOPY;  Service: Gastroenterology;  Laterality: N/A;   GIVENS CAPSULE STUDY N/A 09/23/2021   Procedure: GIVENS CAPSULE STUDY;  Surgeon: San Sandor GAILS, DO;  Location: [facility] ENDOSCOPY;  Service: Gastroenterology;  Laterality: N/A;   graft left arm Left    for dialysis x 2. Removed   HOT HEMOSTASIS  11/18/2020   Procedure: HOT HEMOSTASIS (ARGON PLASMA COAGULATION/BICAP);  Surgeon: Stacia Glendia BRAVO, MD;  Location: [name] ENDOSCOPY;  Service: Gastroenterology;;   HOT HEMOSTASIS N/A 09/23/2021   Procedure: HOT HEMOSTASIS (ARGON PLASMA COAGULATION/BICAP);  Surgeon: San Sandor GAILS, DO;  Location: [facility] ENDOSCOPY;  Service: Gastroenterology;  Laterality: N/A;   INSERTION OF DIALYSIS CATHETER     Rt chest   LAPAROSCOPIC RIGHT COLECTOMY N/A 08/05/2015   Procedure: LAPAROSCOPIC RIGHT COLECTOMY- ASCENDING;  Surgeon: Jina Nephew, MD;  Location: [facility] OR;  Service: General;  Laterality: N/A;   MASS EXCISION Left 05/28/2019   Procedure: EXCISION SOFT TISSUE MASS LEFT SHOULDER;  Surgeon: Eletha Boas, MD;  Location: [facility] ORS;  Service: General;  Laterality: Left;   NEPHRECTOMY Bilateral    PARATHYROIDECTOMY N/A 06/12/2016   Procedure: TOTAL PARATHYROIDECTOMY WITH AUTOTRANSPLANTATION TO LEFT FOREARM;  Surgeon: Boas Eletha, MD;  Location: [facility] OR;  Service: General;  Laterality: N/A;   PARATHYROIDECTOMY N/A 10/23/2016   Procedure: PARATHYROIDECTOMY;  Surgeon: Eletha Boas, MD;  Location: [facility] OR;  Service: General;  Laterality: N/A;   REVERSE SHOULDER ARTHROPLASTY Right 08/24/2020   Procedure: REVERSE SHOULDER ARTHROPLASTY;  Surgeon: Cristy Bonner DASEN, MD;  Location: [facility] OR;  Service: Orthopedics;  Laterality: Right;   REVISON OF ARTERIOVENOUS FISTULA Right 07/16/2017   Procedure: REVISION OF ARTERIOVENOUS FISTULA  Right ARM;  Surgeon: Sheree Penne Bruckner, MD;  Location: [facility] OR;  Service: Vascular;  Laterality: Right;   TOTAL HIP ARTHROPLASTY Left 11/14/2020   Procedure: LEFT TOTAL HIP ARTHROPLASTY ANTERIOR APPROACH;  Surgeon: [name] Kay HERO, MD;  Location: [facility] OR;  Service: Orthopedics;  Laterality: Left;   UPPER GASTROINTESTINAL ENDOSCOPY  2021   @ [facility]   Patient Active Problem List   Diagnosis Date Noted   Duodenitis    Gastritis and gastroduodenitis    Ischemic colitis (HCC)    Bright red blood per rectum    ABLA (acute blood loss anemia)    Gastric ulcer    Duodenal ulcer    Diverticulosis of colon without hemorrhage    GI bleed 09/17/2021   Acute upper GI bleeding 09/11/2021   Mucosal abnormality of esophagus    Mucosal abnormality of stomach    Mucosal abnormality of duodenum    Upper GI bleed    Left hip pain 11/16/2020   Type 2 diabetes mellitus with other specified complication (HCC)  11/14/2020   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/14/2020   Primary osteoarthritis of left hip 10/07/2020   Status post reverse total arthroplasty of right shoulder 08/24/2020   Diverticulitis 03/08/2020   ESRD (end stage renal disease) (HCC) 03/08/2020   Polyneuropathy in diseases classified elsewhere (HCC) 03/08/2020   Pure hypercholesterolemia 03/08/2020   Sacral back pain 03/08/2020   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 11/10/2019   Acute gastric ulcer with hemorrhage    Acute GI bleeding 07/31/2019   Hypotension of hemodialysis 07/31/2019   Diverticulitis large intestine 07/31/2019   Mass of soft tissue of shoulder 05/26/2019   Awaiting organ transplant status 03/18/2019   Finding of cocaine in blood 02/25/2019   Anaphylactic shock, unspecified, sequela 11/21/2018   Hypercalcemia 10/28/2018   Diarrhea, unspecified 08/08/2018   Hyperkalemia 03/12/2018   Renal cell carcinoma of left kidney (HCC) 12/24/2017   Fluid overload, unspecified 07/06/2017   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    Other specified disorders of kidney and ureter 11/22/2016   Secondary hyperparathyroidism of renal origin (HCC) 10/23/2016   Headache disorder 08/21/2016   TIA (transient ischemic attack)    Essential hypertension    Seizures (HCC)    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    Lung nodule    Diverticulitis of cecum 07/21/2015   Diverticulitis of large intestine without perforation or abscess without bleeding    Right lower quadrant pain    Obstructive sleep apnea 02/08/2015   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   Diverticulosis 07/13/2014   Lower GI bleed 07/12/2014   Refusal of blood transfusions as patient is Jehovah's Witness    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   HLD (hyperlipidemia)    PRES (posterior reversible encephalopathy syndrome)    History of lacunar cerebrovascular accident (CVA) 05/23/2014   Anemia 05/23/2014   GERD (gastroesophageal reflux disease) 12/09/2012   Gastrointestinal bleeding 11/12/2012   Melena 11/12/2012   Other psychoactive substance dependence, uncomplicated (HCC) 10/08/2012   Iron deficiency anemia, unspecified 06/10/2012   Moderate protein-calorie malnutrition (HCC) 07/29/2009   Coagulation defect, unspecified (HCC) 08/29/2006   Type 2 diabetes mellitus with diabetic peripheral angiopathy without gangrene (HCC) 08/29/2006   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 stage renal disease (HCC) 08/02/2006    PCP: [name], MD  REFERRING PROVIDER: [name], MD  REFERRING DIAG: Diagnosis  M25.511 (ICD-10-CM) - Pain in right shoulder    THERAPY DIAG:  Muscle weakness (generalized)  Stiffness of right shoulder, not elsewhere classified  Chronic right shoulder pain  Rationale for Evaluation and Treatment: Rehabilitation  ONSET DATE: Rt reverse total shoulder: 08/2020, pain began 1 month ago  SUBJECTIVE:  SUBJECTIVE STATEMENT: I have been eating less meat and I think my pain is reduced overall.   Hand dominance: Right  PERTINENT HISTORY: Sleep apnea, dialysis M/W/F, Lt total hip replacement (11/2020), Rt total shoulder (08/2020)  PAIN: 12//24: Are you having pain? Yes: NPRS scale: /10 Pain location: Rt shoulder and scapula into Lt scapula Pain description: aching, intense  Aggravating factors: using the arm, driving, sleep  Relieving factors: heating pad, sometimes medication  PRECAUTIONS: Fall and Other: hemodialysis on M,W, and F  RED FLAGS: None   WEIGHT BEARING RESTRICTIONS: No  FALLS:  Has patient fallen in last 6 months? No  LIVING ENVIRONMENT: Lives with: lives with their family Lives in: House/apartment  OCCUPATION: None  PLOF: Independent with basic ADLs and Leisure: walking for exercise  PATIENT GOALS: reduce shoulder pain, use Rt UE without limitation, sleep without interruption   NEXT MD VISIT: none   OBJECTIVE:  Note: Objective measures were completed at Evaluation unless otherwise noted.  DIAGNOSTIC FINDINGS:  Rt shoulder 12/21/22: Well-seated reverse shoulder replacement without complication   PATIENT SURVEYS :  12/26/22: Junie Dash 57  01/23/23: Quick Dash 38  COGNITION: Overall cognitive status: Within functional limits for tasks assessed     SENSATION: WFL  POSTURE: Posterior pelvic tilt, significant trunk flexion, rounded shoulders and forward head  UPPER EXTREMITY ROM:   Active ROM Right eval Right  02/04/23 Left eval  Shoulder flexion 80 (145 supine) 75 Limited by 25% throughout   Shoulder extension 55    Shoulder abduction 60 (75)  60   Shoulder adduction     Shoulder internal rotation Full (supine)    Shoulder external rotation 35 (supine)    Elbow flexion     Elbow  extension     Wrist flexion     Wrist extension     Wrist ulnar deviation     Wrist radial deviation     Wrist pronation     Wrist supination     (Blank rows = not tested)  UPPER EXTREMITY MMT:  MMT Right eval Left eval  Shoulder flexion 3- 4-  Shoulder extension 4 4+  Shoulder abduction 3- 4-  Shoulder adduction    Shoulder internal rotation 4- 4+  Shoulder external rotation 3- 4  Middle trapezius    Lower trapezius    Elbow flexion  4+  Elbow extension    Wrist flexion    Wrist extension    Wrist ulnar deviation    Wrist radial deviation    Wrist pronation    Wrist supination    Grip strength (lbs)    (Blank rows = not tested)    JOINT MOBILITY TESTING:  WFLs   PALPATION:  Diffuse palpable tenderness over Rt posterior shoulder and proximal biceps insertion    TODAY'S TREATMENT:  DATE: 02/12/2023 UBE x 6 forward only-PT present to discuss progress with pt Ball roll outs 3 ways x5  Seated Rt shoulder flexion: 1# 2x10 Supine Serratus punch x 20 with 2 lbs ER 2# weight 2x10 Side lying abduction 2x10  DATE: 01/31/2023 UBE x 6 forward only-PT present to discuss progress with pt Ball roll outs 3 ways x5  Seated Rt shoulder flexion: 1# 2x10 Supine Serratus punch x 20 with 2 lbs Supine shoulder alphabet A-Z with 2 lbs  Supine chest press(narrow and wide grip) with 4# 2x10 each  ER without weight 2x10 Side lying abduction 2x10  DATE: 01/31/2023 UBE x 6 forward only-PT present to discuss progress with pt Overhead pulleys: flexion x3 min  Ball roll outs 3 ways x5  Seated Rt shoulder flexion: 1# 2x10 Supine Serratus punch x 20 with 2 lbs Supine shoulder alphabet A-Z with 2 lbs  Supine chest press(narrow and wide grip) with 4# 2x10 each  ER without weight 2x10 Side lying abduction 2x10  PATIENT EDUCATION: Education details:  Access Code: [email or web address] Person educated: Patient Education method: Explanation, Demonstration, and Handouts Education comprehension: verbalized understanding and returned demonstration  HOME EXERCISE PROGRAM: Access Code: [email or web address] URL: [email or web address] Date: 01/15/2023 Prepared by: Jarrell Menke  Exercises - Shoulder Flexion Wall Slide with Towel  - 1-2 x daily - 7 x weekly - 2 sets - 10 reps - Supine Shoulder Flexion with Free Weight  - 2 x daily - 7 x weekly - 2 sets - 10 reps - Supine Shoulder Flexion AAROM  - 2 x daily - 7 x weekly - 1 sets - 10 reps - 10 hold - Sidelying Shoulder External Rotation  - 1 x daily - 7 x weekly - 3 sets - 10 reps - Shoulder External Rotation and Scapular Retraction with Resistance  - 1 x daily - 7 x weekly - 2 sets - 10 reps - Standing Shoulder Horizontal Abduction with Resistance  - 1 x daily - 7 x weekly - 2 sets - 10 reps  ASSESSMENT:  CLINICAL IMPRESSION: Pt continues to report Rt shoulder pain although this is reduced since last session.  No change in ROM of the Rt shoulder. Pt with functional strength and ROM limitations of his Rt shoulder.  Pt continues to exercise at home.  PT monitored throughout session.  Patient will benefit from skilled PT to address the below impairments and improve overall function.    OBJECTIVE IMPAIRMENTS: decreased activity tolerance, decreased ROM, decreased strength, increased muscle spasms, impaired flexibility, postural dysfunction, and pain.   ACTIVITY LIMITATIONS: carrying, lifting, and reach over head  PARTICIPATION LIMITATIONS: meal prep, cleaning, laundry, and driving  PERSONAL FACTORS: Past/current experiences, Time since onset of injury/illness/exacerbation, and 1-2 comorbidities: Rt shoulder replacement, dialysis   are also affecting patient's functional outcome.   REHAB POTENTIAL: Good  CLINICAL DECISION MAKING: Stable/uncomplicated  EVALUATION COMPLEXITY: Low  GOALS: Goals  reviewed with patient? Yes  SHORT TERM GOALS: Target date: 01/23/2023    Be independent in initial HEP Baseline: Goal status: MET  2.  Improve DASH to < or = to 48  Baseline: 38  Goal status: MET  3.  Demonstrate Rt shoulder A/ROM flexion to > or = to 90 to improve reaching  Baseline:  Goal status: INITIAL  4.  Report > or = to 25% fewer sleep interruptions due to Rt shoulder pain Baseline: waking 3-4x/night  Goal status: INITIAL  LONG TERM GOALS: Target date: 02/20/2023    Be independent in advanced HEP  Baseline:  Goal status: INITIAL  2.  Improve DASH to < or = to 40  Baseline: 38 Goal status: MET  3.  Report > or = to 50% fewer sleep interruptions due to Rt shoulder pain Baseline:  Goal status: INITIAL  4.   Demonstrate Rt shoulder A/ROM flexion to > or = to 105 to improve reaching Baseline: 80 (02/04/23) Goal status: IN PROGRESS  5.  Report > or = to 50% reduction in Rt shoulder pain with use with ADLs and self-care Baseline:  Goal status: INITIAL   PLAN: PT FREQUENCY: 2x/week  PT DURATION: 8 weeks  PLANNED INTERVENTIONS: [id number]-Therapeutic exercises, 97530- Therapeutic activity, [id number]- Neuromuscular re-education, 97535- Self Care, [id number]- Manual therapy, [id number]- Aquatic Therapy, [id number]- Electrical stimulation (unattended), [id number]- Electrical stimulation (manual), [id number]- Ionotophoresis 4mg /ml Dexamethasone , Patient/Family education, Dry Needling, Joint mobilization, Joint manipulation, Cryotherapy, and Moist heat  PLAN FOR NEXT SESSION: 1 more session probable.   Burnard Joy, PT 02/12/23 11:53 AM  [facility] [address] [place], [place] [address] Phone # [phone] Fax [phone]

## 2023-02-15 DIAGNOSIS — S199XXA Unspecified injury of neck, initial encounter: Secondary | ICD-10-CM | POA: Diagnosis not present

## 2023-02-15 DIAGNOSIS — S79912A Unspecified injury of left hip, initial encounter: Secondary | ICD-10-CM | POA: Diagnosis not present

## 2023-02-15 DIAGNOSIS — E875 Hyperkalemia: Secondary | ICD-10-CM | POA: Diagnosis not present

## 2023-02-15 DIAGNOSIS — D689 Coagulation defect, unspecified: Secondary | ICD-10-CM | POA: Diagnosis not present

## 2023-02-15 DIAGNOSIS — S4992XA Unspecified injury of left shoulder and upper arm, initial encounter: Secondary | ICD-10-CM | POA: Diagnosis not present

## 2023-02-15 DIAGNOSIS — N186 End stage renal disease: Secondary | ICD-10-CM | POA: Diagnosis not present

## 2023-02-15 DIAGNOSIS — D509 Iron deficiency anemia, unspecified: Secondary | ICD-10-CM | POA: Diagnosis not present

## 2023-02-15 DIAGNOSIS — R519 Headache, unspecified: Secondary | ICD-10-CM | POA: Diagnosis not present

## 2023-02-15 DIAGNOSIS — R197 Diarrhea, unspecified: Secondary | ICD-10-CM | POA: Diagnosis not present

## 2023-02-15 DIAGNOSIS — S99921A Unspecified injury of right foot, initial encounter: Secondary | ICD-10-CM | POA: Diagnosis not present

## 2023-02-15 DIAGNOSIS — N2581 Secondary hyperparathyroidism of renal origin: Secondary | ICD-10-CM | POA: Diagnosis not present

## 2023-02-15 DIAGNOSIS — E1129 Type 2 diabetes mellitus with other diabetic kidney complication: Secondary | ICD-10-CM | POA: Diagnosis not present

## 2023-02-15 DIAGNOSIS — S3992XA Unspecified injury of lower back, initial encounter: Secondary | ICD-10-CM | POA: Diagnosis not present

## 2023-02-15 DIAGNOSIS — Z992 Dependence on renal dialysis: Secondary | ICD-10-CM | POA: Diagnosis not present

## 2023-02-17 ENCOUNTER — Other Ambulatory Visit: Payer: Self-pay | Admitting: Physician Assistant

## 2023-02-18 DIAGNOSIS — N186 End stage renal disease: Secondary | ICD-10-CM | POA: Diagnosis not present

## 2023-02-18 DIAGNOSIS — M25512 Pain in left shoulder: Secondary | ICD-10-CM | POA: Diagnosis not present

## 2023-02-18 DIAGNOSIS — T148XXA Other injury of unspecified body region, initial encounter: Secondary | ICD-10-CM | POA: Diagnosis not present

## 2023-02-18 DIAGNOSIS — E875 Hyperkalemia: Secondary | ICD-10-CM | POA: Diagnosis not present

## 2023-02-18 DIAGNOSIS — Z992 Dependence on renal dialysis: Secondary | ICD-10-CM | POA: Diagnosis not present

## 2023-02-18 DIAGNOSIS — D509 Iron deficiency anemia, unspecified: Secondary | ICD-10-CM | POA: Diagnosis not present

## 2023-02-18 DIAGNOSIS — R519 Headache, unspecified: Secondary | ICD-10-CM | POA: Diagnosis not present

## 2023-02-18 DIAGNOSIS — R197 Diarrhea, unspecified: Secondary | ICD-10-CM | POA: Diagnosis not present

## 2023-02-18 DIAGNOSIS — N2581 Secondary hyperparathyroidism of renal origin: Secondary | ICD-10-CM | POA: Diagnosis not present

## 2023-02-18 DIAGNOSIS — W108XXD Fall (on) (from) other stairs and steps, subsequent encounter: Secondary | ICD-10-CM | POA: Diagnosis not present

## 2023-02-18 DIAGNOSIS — D689 Coagulation defect, unspecified: Secondary | ICD-10-CM | POA: Diagnosis not present

## 2023-02-18 DIAGNOSIS — E1129 Type 2 diabetes mellitus with other diabetic kidney complication: Secondary | ICD-10-CM | POA: Diagnosis not present

## 2023-02-18 DIAGNOSIS — M25511 Pain in right shoulder: Secondary | ICD-10-CM | POA: Diagnosis not present

## 2023-02-19 ENCOUNTER — Ambulatory Visit: Payer: 59 | Attending: Family Medicine

## 2023-02-19 DIAGNOSIS — G8929 Other chronic pain: Secondary | ICD-10-CM | POA: Insufficient documentation

## 2023-02-19 DIAGNOSIS — M25611 Stiffness of right shoulder, not elsewhere classified: Secondary | ICD-10-CM | POA: Insufficient documentation

## 2023-02-19 DIAGNOSIS — M25511 Pain in right shoulder: Secondary | ICD-10-CM | POA: Diagnosis not present

## 2023-02-19 DIAGNOSIS — M6281 Muscle weakness (generalized): Secondary | ICD-10-CM | POA: Diagnosis not present

## 2023-02-19 NOTE — Therapy (Signed)
 OUTPATIENT PHYSICAL THERAPY TREATMENT NOTE   Patient Name: [name]. MRN: [id number] DOB:Aug 18, 1960, 63 y.o., male Today's Date: 02/19/2023  END OF SESSION:  PT End of Session - 02/19/23 1149     Visit Number 8    Date for PT Re-Evaluation 02/20/23    Authorization Type [employer] dual complete    PT Start Time 1118   late   PT Stop Time 1146    PT Time Calculation (min) 28 min    Activity Tolerance Patient tolerated treatment well    Behavior During Therapy WFL for tasks assessed/performed                 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    Parathyroid abnormality (HCC)    ectopic parathyroid gland   Presence of arteriovenous fistula for hemodialysis, primary (HCC)    RUE PER PT RLE   Refusal of blood product    NO WHOLE BLOOD PROUCTS   Renal cell carcinoma (HCC)    s/p hand assisted laparoscopic bilateral nephrectomies 11/29/17, + RCC left   Secondary hyperparathyroidism (HCC)    Seizures (HCC)    one episode in past, due to" elevated Potassium" 08/02/20- "at least 4 years ago"   Sleep apnea    doesn't use CPAP anymore since weight loss   Stroke [facility])    no residual   Past Surgical History:  Procedure Laterality Date   AV FISTULA PLACEMENT Right    right arm   BIOPSY  08/01/2019   Procedure: BIOPSY;  Surgeon: Charna Elizabeth, MD;  Location: [facility] ENDOSCOPY;  Service: Endoscopy;;   BIOPSY  11/18/2020   Procedure: BIOPSY;  Surgeon:  Jenel Lucks, MD;  Location: [facility] ENDOSCOPY;  Service: Gastroenterology;;   BIOPSY  09/13/2021   Procedure: BIOPSY;  Surgeon: Lemar Lofty., MD;  Location: [facility] ENDOSCOPY;  Service: Gastroenterology;;   CATARACT EXTRACTION W/ INTRAOCULAR LENS  IMPLANT, BILATERAL     COLON SURGERY     COLONOSCOPY N/A 08/04/2015   Procedure: COLONOSCOPY;  Surgeon: Beverley Fiedler, MD;  Location: [facility] ENDOSCOPY;  Service: Endoscopy;  Laterality: N/A;   COLONOSCOPY  2017   JMP@ [facility]-good prep-mass -recall 1 yr   COLONOSCOPY N/A 09/13/2021   Procedure: COLONOSCOPY;  Surgeon: Mansouraty, Netty Starring., MD;  Location: [facility] ENDOSCOPY;  Service: Gastroenterology;  Laterality: N/A;   COLONOSCOPY WITH PROPOFOL N/A 11/25/2020   Procedure: COLONOSCOPY WITH PROPOFOL;  Surgeon: Imogene Burn, MD;  Location: [facility] ENDOSCOPY;  Service: Gastroenterology;  Laterality: N/A;   COLONOSCOPY WITH PROPOFOL N/A 09/23/2021   Procedure: COLONOSCOPY WITH PROPOFOL;  Surgeon: Shellia Cleverly, DO;  Location: [facility] ENDOSCOPY;  Service: Gastroenterology;  Laterality: N/A;   ENTEROSCOPY N/A 09/23/2021   Procedure: ENTEROSCOPY;  Surgeon: Shellia Cleverly, DO;  Location: [facility]  ENDOSCOPY;  Service: Gastroenterology;  Laterality: N/A;  Will place order for video capsule study as we may opt to place it during procedure   ESOPHAGOGASTRODUODENOSCOPY N/A 08/01/2019   Procedure: ESOPHAGOGASTRODUODENOSCOPY (EGD);  Surgeon: Charna Elizabeth, MD;  Location: [facility] ENDOSCOPY;  Service: Endoscopy;  Laterality: N/A;   ESOPHAGOGASTRODUODENOSCOPY N/A 11/18/2020   Procedure: ESOPHAGOGASTRODUODENOSCOPY (EGD);  Surgeon: Jenel Lucks, MD;  Location: [facility] ENDOSCOPY;  Service: Gastroenterology;  Laterality: N/A;   ESOPHAGOGASTRODUODENOSCOPY (EGD) WITH PROPOFOL N/A 08/04/2019   Procedure: ESOPHAGOGASTRODUODENOSCOPY (EGD) WITH PROPOFOL;  Surgeon: Benancio Deeds, MD;  Location: [facility] ENDOSCOPY;  Service: Gastroenterology;  Laterality: N/A;   ESOPHAGOGASTRODUODENOSCOPY (EGD)  WITH PROPOFOL N/A 09/13/2021   Procedure: ESOPHAGOGASTRODUODENOSCOPY (EGD) WITH PROPOFOL;  Surgeon: Meridee Score Netty Starring., MD;  Location: [facility] ENDOSCOPY;  Service: Gastroenterology;  Laterality: N/A;   GIVENS CAPSULE STUDY N/A 09/23/2021   Procedure: GIVENS CAPSULE STUDY;  Surgeon: Shellia Cleverly, DO;  Location: [facility] ENDOSCOPY;  Service: Gastroenterology;  Laterality: N/A;   graft left arm Left    for dialysis x 2. Removed   HOT HEMOSTASIS  11/18/2020   Procedure: HOT HEMOSTASIS (ARGON PLASMA COAGULATION/BICAP);  Surgeon: Jenel Lucks, MD;  Location: [facility] ENDOSCOPY;  Service: Gastroenterology;;   HOT HEMOSTASIS N/A 09/23/2021   Procedure: HOT HEMOSTASIS (ARGON PLASMA COAGULATION/BICAP);  Surgeon: Shellia Cleverly, DO;  Location: [facility] ENDOSCOPY;  Service: Gastroenterology;  Laterality: N/A;   INSERTION OF DIALYSIS CATHETER     Rt chest   LAPAROSCOPIC RIGHT COLECTOMY N/A 08/05/2015   Procedure: LAPAROSCOPIC RIGHT COLECTOMY- ASCENDING;  Surgeon: Almond Lint, MD;  Location: [facility] OR;  Service: General;  Laterality: N/A;   MASS EXCISION Left 05/28/2019   Procedure: EXCISION SOFT TISSUE MASS LEFT SHOULDER;  Surgeon: Darnell Level, MD;  Location: [facility] ORS;  Service: General;  Laterality: Left;   NEPHRECTOMY Bilateral    PARATHYROIDECTOMY N/A 06/12/2016   Procedure: TOTAL PARATHYROIDECTOMY WITH AUTOTRANSPLANTATION TO LEFT FOREARM;  Surgeon: Darnell Level, MD;  Location: [facility] OR;  Service: General;  Laterality: N/A;   PARATHYROIDECTOMY N/A 10/23/2016   Procedure: PARATHYROIDECTOMY;  Surgeon: Darnell Level, MD;  Location: [facility] OR;  Service: General;  Laterality: N/A;   REVERSE SHOULDER ARTHROPLASTY Right 08/24/2020   Procedure: REVERSE SHOULDER ARTHROPLASTY;  Surgeon: Bjorn Pippin, MD;  Location: [facility] OR;  Service: Orthopedics;  Laterality: Right;   REVISON OF ARTERIOVENOUS FISTULA Right 07/16/2017   Procedure: REVISION OF ARTERIOVENOUS FISTULA  Right ARM;  Surgeon: Maeola Harman, MD;  Location: [facility] OR;   Service: Vascular;  Laterality: Right;   TOTAL HIP ARTHROPLASTY Left 11/14/2020   Procedure: LEFT TOTAL HIP ARTHROPLASTY ANTERIOR APPROACH;  Surgeon: Tarry Kos, MD;  Location: [facility] OR;  Service: Orthopedics;  Laterality: Left;   UPPER GASTROINTESTINAL ENDOSCOPY  2021   @ [facility]   Patient Active Problem List   Diagnosis Date Noted   Duodenitis    Gastritis and gastroduodenitis    Ischemic colitis (HCC)    Bright red blood per rectum    ABLA (acute blood loss anemia)    Gastric ulcer    Duodenal ulcer    Diverticulosis of colon without hemorrhage    GI bleed 09/17/2021   Acute upper GI bleeding 09/11/2021   Mucosal abnormality of esophagus    Mucosal abnormality of stomach    Mucosal abnormality of duodenum    Upper GI bleed    Left hip pain 11/16/2020   Type 2 diabetes mellitus with other specified complication (HCC) 11/14/2020   Status post total replacement of  left hip 11/14/2020   Primary osteoarthritis of left hip 10/07/2020   Status post reverse total arthroplasty of right shoulder 08/24/2020   Diverticulitis 03/08/2020   ESRD (end stage renal disease) (HCC) 03/08/2020   Polyneuropathy in diseases classified elsewhere (HCC) 03/08/2020   Pure hypercholesterolemia 03/08/2020   Sacral back pain 03/08/2020   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 11/10/2019   Acute gastric ulcer with hemorrhage    Acute GI bleeding 07/31/2019   Hypotension of hemodialysis 07/31/2019   Diverticulitis large intestine 07/31/2019   Mass of soft tissue of shoulder 05/26/2019   Awaiting organ transplant status 03/18/2019   Finding of cocaine in blood 02/25/2019   Anaphylactic shock, unspecified, sequela 11/21/2018   Hypercalcemia 10/28/2018   Diarrhea, unspecified 08/08/2018   Hyperkalemia 03/12/2018   Renal cell carcinoma of left kidney (HCC) 12/24/2017   Fluid overload, unspecified 07/06/2017   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   Other specified disorders of kidney and ureter  11/22/2016   Secondary hyperparathyroidism of renal origin (HCC) 10/23/2016   Headache disorder 08/21/2016   TIA (transient ischemic attack)    Essential hypertension    Seizures (HCC)    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    Lung nodule    Diverticulitis of cecum 07/21/2015   Diverticulitis of large intestine without perforation or abscess without bleeding    Right lower quadrant pain    Obstructive sleep apnea 02/08/2015   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   Diverticulosis 07/13/2014   Lower GI bleed 07/12/2014   Refusal of blood transfusions as patient is Jehovah's Witness    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   HLD (hyperlipidemia)    PRES (posterior reversible encephalopathy syndrome)    History of lacunar cerebrovascular accident (CVA) 05/23/2014   Anemia 05/23/2014   GERD (gastroesophageal reflux disease) 12/09/2012   Gastrointestinal bleeding 11/12/2012   Melena 11/12/2012   Other psychoactive substance dependence, uncomplicated (HCC) 10/08/2012   Iron deficiency anemia, unspecified 06/10/2012   Moderate protein-calorie malnutrition (HCC) 07/29/2009   Coagulation defect, unspecified (HCC) 08/29/2006   Type 2 diabetes mellitus with diabetic peripheral angiopathy without gangrene (HCC) 08/29/2006   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 stage renal disease (HCC) 08/02/2006    PCP: Darrow Bussing, MD  REFERRING PROVIDER: Darrow Bussing, MD  REFERRING DIAG: Diagnosis  M25.511 (ICD-10-CM) - Pain in right shoulder    THERAPY DIAG:  Muscle weakness (generalized)  Stiffness of right shoulder, not elsewhere classified  Chronic right shoulder pain  Rationale for Evaluation and Treatment: Rehabilitation  ONSET DATE: Rt reverse total shoulder: 08/2020, pain began 1 month ago  SUBJECTIVE:  SUBJECTIVE STATEMENT: I fell down 18 steps with my trash on 02/15/23.  I am having Lt shoulder pain and will likely be back for that.   Hand dominance: Right  PERTINENT HISTORY: Sleep apnea, dialysis M/W/F, Lt total hip replacement (11/2020), Rt total shoulder (08/2020)  PAIN: 02/19/23 Are you having pain? Yes: NPRS scale: 2/10 Pain location: Rt shoulder and scapula into Lt scapula Pain description: aching, intense  Aggravating factors: using the arm, driving, sleep  Relieving factors: heating pad, sometimes medication  PRECAUTIONS: Fall and Other: hemodialysis on M,W, and F  RED FLAGS: None   WEIGHT BEARING RESTRICTIONS: No  FALLS:  Has patient fallen in last 6 months? No  LIVING ENVIRONMENT: Lives with: lives with their family Lives in: House/apartment  OCCUPATION: None  PLOF: Independent with basic ADLs and Leisure: walking for exercise  PATIENT GOALS: reduce shoulder pain, use Rt UE without limitation, sleep without interruption   NEXT MD VISIT: none   OBJECTIVE:  Note: Objective measures were completed at Evaluation unless otherwise noted.  DIAGNOSTIC FINDINGS:  Rt shoulder 12/21/22: Well-seated reverse shoulder replacement without complication   PATIENT SURVEYS :  12/26/22: Nino Parsley Dash 59  01/23/23: Quick Dash 38  COGNITION: Overall cognitive status: Within functional limits for tasks assessed     SENSATION: WFL  POSTURE: Posterior pelvic tilt, significant trunk flexion, rounded shoulders and forward head  UPPER EXTREMITY ROM:   Active ROM Right eval Right  02/04/23 Left eval  Shoulder flexion 80 (145 supine) 75 Limited by 25% throughout   Shoulder extension 55    Shoulder abduction 60 (75)  60   Shoulder adduction     Shoulder internal rotation Full (supine)    Shoulder external rotation 35 (supine)    Elbow flexion     Elbow  extension     Wrist flexion     Wrist extension     Wrist ulnar deviation     Wrist radial deviation     Wrist pronation     Wrist supination     (Blank rows = not tested)  UPPER EXTREMITY MMT:  MMT Right eval Left eval  Shoulder flexion 3- 4-  Shoulder extension 4 4+  Shoulder abduction 3- 4-  Shoulder adduction    Shoulder internal rotation 4- 4+  Shoulder external rotation 3- 4  Middle trapezius    Lower trapezius    Elbow flexion  4+  Elbow extension    Wrist flexion    Wrist extension    Wrist ulnar deviation    Wrist radial deviation    Wrist pronation    Wrist supination    Grip strength (lbs)    (Blank rows = not tested)    JOINT MOBILITY TESTING:  WFLs   PALPATION:  Diffuse palpable tenderness over Rt posterior shoulder and proximal biceps insertion    TODAY'S TREATMENT:  DATE: 02/18/2022 UBE x 6 forward only-PT present to discuss progress with pt Overhead pulleys: flexion x 4 minutes  Chest press: 3# added to cane 2x10 Ball roll outs 3 ways x5  Seated Rt shoulder flexion: 1# 2x10 Supine Serratus punch x 20 with 2 lbs ER 2# weight 2x10 Side lying abduction 2x10  DATE: 02/12/2023 UBE x 6 forward only-PT present to discuss progress with pt Ball roll outs 3 ways x5  Seated Rt shoulder flexion: 1# 2x10 Supine Serratus punch x 20 with 2 lbs ER 2# weight 2x10 Side lying abduction 2x10  DATE: 01/31/2023 UBE x 6 forward only-PT present to discuss progress with pt Ball roll outs 3 ways x5  Seated Rt shoulder flexion: 1# 2x10 Supine Serratus punch x 20 with 2 lbs Supine shoulder alphabet A-Z with 2 lbs  Supine chest press(narrow and wide grip) with 4# 2x10 each  ER without weight 2x10 Side lying abduction 2x10  PATIENT EDUCATION: Education details: Access Code: [email or web address] Person educated: Patient Education method:  Explanation, Demonstration, and Handouts Education comprehension: verbalized understanding and returned demonstration  HOME EXERCISE PROGRAM: Access Code: [email or web address] URL: [email or web address] Date: 01/15/2023 Prepared by: Clydie Braun Menke  Exercises - Shoulder Flexion Wall Slide with Towel  - 1-2 x daily - 7 x weekly - 2 sets - 10 reps - Supine Shoulder Flexion with Free Weight  - 2 x daily - 7 x weekly - 2 sets - 10 reps - Supine Shoulder Flexion AAROM  - 2 x daily - 7 x weekly - 1 sets - 10 reps - 10 hold - Sidelying Shoulder External Rotation  - 1 x daily - 7 x weekly - 3 sets - 10 reps - Shoulder External Rotation and Scapular Retraction with Resistance  - 1 x daily - 7 x weekly - 2 sets - 10 reps - Standing Shoulder Horizontal Abduction with Resistance  - 1 x daily - 7 x weekly - 2 sets - 10 reps  ASSESSMENT:  CLINICAL IMPRESSION: Pt had a fall since last session and is now having Lt shoulder pain.  He is going to see MD next week to get an order for his Lt shoulder.  PT educated pt on over the door pulleys and provided info on how to order so he can stretch his shoulders at home.  Pt continues to exercise at home and PT encouraged him to keep both arms moving to maintain mobility.  PT monitored throughout session.  Patient will benefit from skilled PT to address the below impairments and improve overall function.    OBJECTIVE IMPAIRMENTS: decreased activity tolerance, decreased ROM, decreased strength, increased muscle spasms, impaired flexibility, postural dysfunction, and pain.   ACTIVITY LIMITATIONS: carrying, lifting, and reach over head  PARTICIPATION LIMITATIONS: meal prep, cleaning, laundry, and driving  PERSONAL FACTORS: Past/current experiences, Time since onset of injury/illness/exacerbation, and 1-2 comorbidities: Rt shoulder replacement, dialysis   are also affecting patient's functional outcome.   REHAB POTENTIAL: Good  CLINICAL DECISION MAKING:  Stable/uncomplicated  EVALUATION COMPLEXITY: Low  GOALS: Goals reviewed with patient? Yes  SHORT TERM GOALS: Target date: 01/23/2023    Be independent in initial HEP Baseline: Goal status: MET  2.  Improve DASH to < or = to 48  Baseline: 38  Goal status: MET  3.  Demonstrate Rt shoulder A/ROM flexion to > or = to 90 to improve reaching  Baseline:  Goal status: INITIAL  4.  Report > or = to 25% fewer sleep interruptions due to Rt shoulder pain  Baseline: waking 3-4x/night  Goal status: INITIAL  LONG TERM GOALS: Target date: 02/20/2023    Be independent in advanced HEP Baseline:  Goal status: INITIAL  2.  Improve DASH to < or = to 40  Baseline: 38 Goal status: MET  3.  Report > or = to 50% fewer sleep interruptions due to Rt shoulder pain Baseline:  Goal status: NOT MET  4.   Demonstrate Rt shoulder A/ROM flexion to > or = to 105 to improve reaching Baseline: 80 (02/04/23) Goal status: NOT MET  5.  Report > or = to 50% reduction in Rt shoulder pain with use with ADLs and self-care Baseline:  Goal status: NOT MET   PLAN: PT FREQUENCY: 2x/week  PT DURATION: 8 weeks  PLANNED INTERVENTIONS: 97110-Therapeutic exercises, 97530- Therapeutic activity, [id number]- Neuromuscular re-education, 97535- Self Care, [id number]- Manual therapy, [id number]- Aquatic Therapy, [id number]- Electrical stimulation (unattended), [id number]- Electrical stimulation (manual), 97033- Ionotophoresis 4mg /ml Dexamethasone, Patient/Family education, Dry Needling, Joint mobilization, Joint manipulation, Cryotherapy, and Moist heat  PLAN FOR NEXT SESSION: Hold chart as pt might get an order for his Lt shoulder from MD Lorrene Reid, PT 02/19/23 11:50 AM   PHYSICAL THERAPY DISCHARGE SUMMARY Visits: 8   As of 04/29/23, patient has not returned for further visits and discharged from PT at this time.  Please see above for most recent PT update.  Patient agrees to discharge. Patient goals were partially met. Patient is  being discharged due to not returning since the last visit.  Lorrene Reid, PT 04/29/23, 10:33 AM  [facility] [address] [place], [place] [address] Phone # [phone] Fax [phone]

## 2023-02-20 DIAGNOSIS — R197 Diarrhea, unspecified: Secondary | ICD-10-CM | POA: Diagnosis not present

## 2023-02-20 DIAGNOSIS — E1129 Type 2 diabetes mellitus with other diabetic kidney complication: Secondary | ICD-10-CM | POA: Diagnosis not present

## 2023-02-20 DIAGNOSIS — E875 Hyperkalemia: Secondary | ICD-10-CM | POA: Diagnosis not present

## 2023-02-20 DIAGNOSIS — N186 End stage renal disease: Secondary | ICD-10-CM | POA: Diagnosis not present

## 2023-02-20 DIAGNOSIS — D689 Coagulation defect, unspecified: Secondary | ICD-10-CM | POA: Diagnosis not present

## 2023-02-20 DIAGNOSIS — Z992 Dependence on renal dialysis: Secondary | ICD-10-CM | POA: Diagnosis not present

## 2023-02-20 DIAGNOSIS — D509 Iron deficiency anemia, unspecified: Secondary | ICD-10-CM | POA: Diagnosis not present

## 2023-02-20 DIAGNOSIS — N2581 Secondary hyperparathyroidism of renal origin: Secondary | ICD-10-CM | POA: Diagnosis not present

## 2023-02-20 DIAGNOSIS — R519 Headache, unspecified: Secondary | ICD-10-CM | POA: Diagnosis not present

## 2023-02-21 ENCOUNTER — Ambulatory Visit: Payer: 59

## 2023-02-21 DIAGNOSIS — E1151 Type 2 diabetes mellitus with diabetic peripheral angiopathy without gangrene: Secondary | ICD-10-CM

## 2023-02-21 NOTE — Progress Notes (Signed)
 Patient presents to the office today for diabetic shoe and insole measuring.  Patient was measured with brannock device to determine size and width for 1 pair of extra depth shoes and foam casted for 3 pair of insoles.   Documentation of medical necessity will be sent to patient's treating diabetic doctor to verify and sign.   Patient's diabetic provider: Dibas Koirala MD   Shoes and insoles will be ordered at that time and patient will be notified for an appointment for fitting when they arrive.   Shoe size (per patient): 62M Brannock measurement: 15 Patient shoe selection- Shoe choice:   [id number] / brooks white [id number] Shoe size ordered: 62M [employer] signed

## 2023-02-22 DIAGNOSIS — Z992 Dependence on renal dialysis: Secondary | ICD-10-CM | POA: Diagnosis not present

## 2023-02-22 DIAGNOSIS — N2581 Secondary hyperparathyroidism of renal origin: Secondary | ICD-10-CM | POA: Diagnosis not present

## 2023-02-22 DIAGNOSIS — E1129 Type 2 diabetes mellitus with other diabetic kidney complication: Secondary | ICD-10-CM | POA: Diagnosis not present

## 2023-02-22 DIAGNOSIS — E875 Hyperkalemia: Secondary | ICD-10-CM | POA: Diagnosis not present

## 2023-02-22 DIAGNOSIS — R197 Diarrhea, unspecified: Secondary | ICD-10-CM | POA: Diagnosis not present

## 2023-02-22 DIAGNOSIS — N186 End stage renal disease: Secondary | ICD-10-CM | POA: Diagnosis not present

## 2023-02-22 DIAGNOSIS — D509 Iron deficiency anemia, unspecified: Secondary | ICD-10-CM | POA: Diagnosis not present

## 2023-02-22 DIAGNOSIS — D689 Coagulation defect, unspecified: Secondary | ICD-10-CM | POA: Diagnosis not present

## 2023-02-22 DIAGNOSIS — R519 Headache, unspecified: Secondary | ICD-10-CM | POA: Diagnosis not present

## 2023-02-25 ENCOUNTER — Ambulatory Visit (INDEPENDENT_AMBULATORY_CARE_PROVIDER_SITE_OTHER): Payer: 59 | Admitting: Orthopedic Surgery

## 2023-02-25 DIAGNOSIS — E1129 Type 2 diabetes mellitus with other diabetic kidney complication: Secondary | ICD-10-CM | POA: Diagnosis not present

## 2023-02-25 DIAGNOSIS — Z992 Dependence on renal dialysis: Secondary | ICD-10-CM | POA: Diagnosis not present

## 2023-02-25 DIAGNOSIS — N2581 Secondary hyperparathyroidism of renal origin: Secondary | ICD-10-CM | POA: Diagnosis not present

## 2023-02-25 DIAGNOSIS — T85698A Other mechanical complication of other specified internal prosthetic devices, implants and grafts, initial encounter: Secondary | ICD-10-CM | POA: Diagnosis not present

## 2023-02-25 DIAGNOSIS — H9311 Tinnitus, right ear: Secondary | ICD-10-CM | POA: Diagnosis not present

## 2023-02-25 DIAGNOSIS — N186 End stage renal disease: Secondary | ICD-10-CM | POA: Diagnosis not present

## 2023-02-25 DIAGNOSIS — R519 Headache, unspecified: Secondary | ICD-10-CM | POA: Diagnosis not present

## 2023-02-25 DIAGNOSIS — H6121 Impacted cerumen, right ear: Secondary | ICD-10-CM | POA: Diagnosis not present

## 2023-02-25 DIAGNOSIS — D509 Iron deficiency anemia, unspecified: Secondary | ICD-10-CM | POA: Diagnosis not present

## 2023-02-25 DIAGNOSIS — R197 Diarrhea, unspecified: Secondary | ICD-10-CM | POA: Diagnosis not present

## 2023-02-25 DIAGNOSIS — D689 Coagulation defect, unspecified: Secondary | ICD-10-CM | POA: Diagnosis not present

## 2023-02-25 DIAGNOSIS — E875 Hyperkalemia: Secondary | ICD-10-CM | POA: Diagnosis not present

## 2023-02-26 ENCOUNTER — Other Ambulatory Visit: Payer: Self-pay | Admitting: Neurology

## 2023-02-26 ENCOUNTER — Encounter: Payer: Self-pay | Admitting: Orthopedic Surgery

## 2023-02-26 DIAGNOSIS — N186 End stage renal disease: Secondary | ICD-10-CM | POA: Diagnosis not present

## 2023-02-26 DIAGNOSIS — Z992 Dependence on renal dialysis: Secondary | ICD-10-CM | POA: Diagnosis not present

## 2023-02-26 DIAGNOSIS — G5603 Carpal tunnel syndrome, bilateral upper limbs: Secondary | ICD-10-CM | POA: Diagnosis not present

## 2023-02-26 NOTE — Progress Notes (Signed)
 Office Visit Note   Patient: Carl Beauchaine Sr.           Date of Birth: 17-Nov-1960           MRN: [id number] Visit Date: 02/25/2023 Requested by: [name], MD [address] [place],  [place] [address] PCP: [name], MD  Subjective: Chief Complaint  Patient presents with   Right Shoulder - Pain   Left Shoulder - Pain    HPI: Carl Dileonardo Sr. is a 63 y.o. male who presents to the office reporting left and right shoulder pain.  Patient underwent left glenohumeral injection for rotator cuff arthropathy on 12/21/2022.  He did reasonably well with them.  Patient underwent reverse shoulder replacement on the right-hand side for rotator cuff arthropathy done elsewhere approximately 2 and half years ago.  Initially did reasonably well with the procedure but now he reports increasing pain and diminished range of motion in that right shoulder.  He also describes several year history of left shoulder pain which is gradually worsening and he is also losing strength on the left-hand side.  Patient states the pain does wake him from sleep at night.  He is on dialysis with a fistula on the right-hand side.  Outside radiographs are reviewed.  On the left-hand side side there is glenohumeral joint arthritis along with some narrowing of the acromiohumeral distance..  On the right-hand side a reverse shoulder prosthesis has been placed.  There is 2 to 3 cm of distal migration of the implant on the humeral side with evidence of loosening with possible impending fracture on the medial aspect of the humeral shaft.  Significant calcar resorption has occurred.  Greater tuberosity remains intact.  Glenoid baseplate appears relatively relatively superior on the glenoid face.  Shoulder is located and there is no acute fracture.             ROS: All systems reviewed are negative as they relate to the chief complaint within the history of present illness.  Patient denies fevers or  chills.  Assessment & Plan: Visit Diagnoses:  1. Loosening of prosthesis     Plan: Impression is left shoulder rotator cuff arthropathy and arthritis.  Reasonable result with injection but this is something that is going to need reverse shoulder replacement sometime in the near future.  However his bigger problem is the right reverse shoulder replacement.  Evaluation of the initial postoperative radiographs demonstrated implants to be in good position but the humeral implant is likely slightly undersized.  This has contributed to this proximal migration and infection is another possibility.  Nonetheless significant calcar resorption has occurred and the stem of the humeral implant is bowing with medial cortex of the humeral shaft.  This will need to be revised with either 1 stage or two-stage reimplantation.  It really depends on pathologic findings at the time of the surgery.  CT scan ordered to quantify bone loss on the humeral shaft as well as presence or absence of effusion as well as to evaluate the position of the baseplate on the glenoid.  Will see him back after that study and make operative plans at that time.  Follow-Up Instructions: No follow-ups on file.   Orders:  Orders Placed This Encounter  Procedures   CT SHOULDER RIGHT WO CONTRAST   No orders of the defined types were placed in this encounter.     Procedures: No procedures performed   Clinical Data: No additional findings.  Objective: Vital Signs:  There were no vitals taken for this visit.  Physical Exam:  Constitutional: Patient appears well-developed HEENT:  Head: Normocephalic Eyes:EOM are normal Neck: Normal range of motion Cardiovascular: Normal rate Pulmonary/chest: Effort normal Neurologic: Patient is alert Skin: Skin is warm Psychiatric: Patient has normal mood and affect  Ortho Exam: Ortho exam demonstrates functional deltoid on both sides.  Incision is intact on the right-hand side with no  erythema warmth or drainage.  He has less than 90 degrees of forward flexion abduction on both sides.  Both shoulders are stable.  Motor or sensory function of the hands intact.  He does have a fistula down around the right arm but his hand is perfused.  Left arm radial pulse intact.  Cervical spine range of motion intact.  Specialty Comments:  No specialty comments available.  Imaging: No results found.   PMFS History: Patient Active Problem List   Diagnosis Date Noted   Duodenitis    Gastritis and gastroduodenitis    Ischemic colitis (HCC)    Bright red blood per rectum    ABLA (acute blood loss anemia)    Gastric ulcer    Duodenal ulcer    Diverticulosis of colon without hemorrhage    GI bleed 09/17/2021   Acute upper GI bleeding 09/11/2021   Mucosal abnormality of esophagus    Mucosal abnormality of stomach    Mucosal abnormality of duodenum    Upper GI bleed    Left hip pain 11/16/2020   Type 2 diabetes mellitus with other specified complication (HCC) 11/14/2020   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/14/2020   Primary osteoarthritis of left hip 10/07/2020   Status post reverse total arthroplasty of right shoulder 08/24/2020   Diverticulitis 03/08/2020   ESRD (end stage renal disease) (HCC) 03/08/2020   Polyneuropathy in diseases classified elsewhere (HCC) 03/08/2020   Pure hypercholesterolemia 03/08/2020   Sacral back pain 03/08/2020   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 11/10/2019   Acute gastric ulcer with hemorrhage    Acute GI bleeding 07/31/2019   Hypotension of hemodialysis 07/31/2019   Diverticulitis large intestine 07/31/2019   Mass of soft tissue of shoulder 05/26/2019   Awaiting organ transplant status 03/18/2019   Finding of cocaine in blood 02/25/2019   Anaphylactic shock, unspecified, sequela 11/21/2018   Hypercalcemia 10/28/2018   Diarrhea, unspecified 08/08/2018   Hyperkalemia 03/12/2018   Renal cell carcinoma of left kidney (HCC) 12/24/2017    Fluid overload, unspecified 07/06/2017   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   Other specified disorders of kidney and ureter 11/22/2016   Secondary hyperparathyroidism of renal origin (HCC) 10/23/2016   Headache disorder 08/21/2016   TIA (transient ischemic attack)    Essential hypertension    Seizures (HCC)    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    Lung nodule    Diverticulitis of cecum 07/21/2015   Diverticulitis of large intestine without perforation or abscess without bleeding    Right lower quadrant pain    Obstructive sleep apnea 02/08/2015   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   Diverticulosis 07/13/2014   Lower GI bleed 07/12/2014   Refusal of blood transfusions as patient is Jehovah's Witness    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   HLD (hyperlipidemia)    PRES (posterior reversible encephalopathy syndrome)    History of lacunar cerebrovascular accident (CVA) 05/23/2014   Anemia 05/23/2014   GERD (gastroesophageal reflux disease) 12/09/2012   Gastrointestinal bleeding 11/12/2012  Melena 11/12/2012   Other psychoactive substance dependence, uncomplicated (HCC) 10/08/2012   Iron deficiency anemia, unspecified 06/10/2012   Moderate protein-calorie malnutrition (HCC) 07/29/2009   Coagulation defect, unspecified (HCC) 08/29/2006   Type 2 diabetes mellitus with diabetic peripheral angiopathy without gangrene (HCC) 08/29/2006   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 stage renal disease (HCC) 08/02/2006   Past Medical History:  Diagnosis Date   Acute gastric ulcer with hemorrhage    Acute GI bleeding 07/31/2019   Acute pericarditis    Anemia    hx of   Anxiety    situational    Arthritis    on meds   Borderline diabetes    Cataract    bilateral sx   Chronic headaches    Depression    situational    Diverticulitis    ESRD (end stage  renal disease) (HCC)     On Renal Transplant List, [facility]; MWF (10/23/2016)   GERD (gastroesophageal reflux disease)    with certain foods   GI bleed    Hypertension    diet controlled   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    Parathyroid  abnormality (HCC)    ectopic parathyroid  gland   Presence of arteriovenous fistula for hemodialysis, primary (HCC)    RUE PER PT RLE   Refusal of blood product    NO WHOLE BLOOD PROUCTS   Renal cell carcinoma (HCC)    s/p hand assisted laparoscopic bilateral nephrectomies 11/29/17, + RCC left   Secondary hyperparathyroidism (HCC)    Seizures (HCC)    one episode in past, due to elevated Potassium 08/02/20- at least 4 years ago   Sleep apnea    doesn't use CPAP anymore since weight loss   Stroke (HCC)    no residual    Family History  Problem Relation Age of Onset   Diabetes Father    Stroke Father    Hypertension Father    Uterine cancer Mother    Lupus Sister    Stroke Sister    Hypertension Sister    Anuerysm Brother        brain   Colon cancer Neg Hx    Esophageal cancer Neg Hx    Stomach cancer Neg Hx    Pancreatic cancer Neg Hx    Liver disease Neg Hx    Colon polyps Neg Hx    Rectal cancer Neg Hx     Past Surgical History:  Procedure Laterality Date   AV FISTULA PLACEMENT Right    right arm   BIOPSY  08/01/2019   Procedure: BIOPSY;  Surgeon: Kristie Lamprey, MD;  Location: [facility] ENDOSCOPY;  Service: Endoscopy;;   BIOPSY  11/18/2020   Procedure: BIOPSY;  Surgeon: Stacia Glendia BRAVO, MD;  Location: [facility] ENDOSCOPY;  Service: Gastroenterology;;   BIOPSY  09/13/2021   Procedure: BIOPSY;  Surgeon: Wilhelmenia Aloha Raddle., MD;  Location: [facility] ENDOSCOPY;  Service: Gastroenterology;;   CATARACT EXTRACTION W/ INTRAOCULAR LENS  IMPLANT, BILATERAL     COLON SURGERY     COLONOSCOPY N/A 08/04/2015   Procedure: COLONOSCOPY;  Surgeon: Gordy CHRISTELLA Starch, MD;  Location: [facility] ENDOSCOPY;  Service: Endoscopy;  Laterality: N/A;   COLONOSCOPY  2017   JMP@  [facility]-good prep-mass -recall 1 yr   COLONOSCOPY N/A 09/13/2021   Procedure: COLONOSCOPY;  Surgeon: Mansouraty, Aloha Raddle., MD;  Location: [facility] ENDOSCOPY;  Service: Gastroenterology;  Laterality: N/A;   COLONOSCOPY WITH PROPOFOL  N/A 11/25/2020   Procedure: COLONOSCOPY WITH PROPOFOL ;  Surgeon:  Federico Rosario BROCKS, MD;  Location: [facility] ENDOSCOPY;  Service: Gastroenterology;  Laterality: N/A;   COLONOSCOPY WITH PROPOFOL  N/A 09/23/2021   Procedure: COLONOSCOPY WITH PROPOFOL ;  Surgeon: San Sandor GAILS, DO;  Location: [facility] ENDOSCOPY;  Service: Gastroenterology;  Laterality: N/A;   ENTEROSCOPY N/A 09/23/2021   Procedure: ENTEROSCOPY;  Surgeon: San Sandor GAILS, DO;  Location: [facility] ENDOSCOPY;  Service: Gastroenterology;  Laterality: N/A;  Will place order for video capsule study as we may opt to place it during procedure   ESOPHAGOGASTRODUODENOSCOPY N/A 08/01/2019   Procedure: ESOPHAGOGASTRODUODENOSCOPY (EGD);  Surgeon: Kristie Lamprey, MD;  Location: [facility] ENDOSCOPY;  Service: Endoscopy;  Laterality: N/A;   ESOPHAGOGASTRODUODENOSCOPY N/A 11/18/2020   Procedure: ESOPHAGOGASTRODUODENOSCOPY (EGD);  Surgeon: Stacia Glendia BRAVO, MD;  Location: [facility] ENDOSCOPY;  Service: Gastroenterology;  Laterality: N/A;   ESOPHAGOGASTRODUODENOSCOPY (EGD) WITH PROPOFOL  N/A 08/04/2019   Procedure: ESOPHAGOGASTRODUODENOSCOPY (EGD) WITH PROPOFOL ;  Surgeon: Leigh Elspeth SQUIBB, MD;  Location: [facility] ENDOSCOPY;  Service: Gastroenterology;  Laterality: N/A;   ESOPHAGOGASTRODUODENOSCOPY (EGD) WITH PROPOFOL  N/A 09/13/2021   Procedure: ESOPHAGOGASTRODUODENOSCOPY (EGD) WITH PROPOFOL ;  Surgeon: Wilhelmenia Aloha Raddle., MD;  Location: [facility] ENDOSCOPY;  Service: Gastroenterology;  Laterality: N/A;   GIVENS CAPSULE STUDY N/A 09/23/2021   Procedure: GIVENS CAPSULE STUDY;  Surgeon: San Sandor GAILS, DO;  Location: [facility] ENDOSCOPY;  Service: Gastroenterology;  Laterality: N/A;   graft left arm Left    for dialysis x 2. Removed   HOT HEMOSTASIS  11/18/2020   Procedure: HOT  HEMOSTASIS (ARGON PLASMA COAGULATION/BICAP);  Surgeon: Stacia Glendia BRAVO, MD;  Location: [facility] ENDOSCOPY;  Service: Gastroenterology;;   HOT HEMOSTASIS N/A 09/23/2021   Procedure: HOT HEMOSTASIS (ARGON PLASMA COAGULATION/BICAP);  Surgeon: San Sandor GAILS, DO;  Location: [facility] ENDOSCOPY;  Service: Gastroenterology;  Laterality: N/A;   INSERTION OF DIALYSIS CATHETER     Rt chest   LAPAROSCOPIC RIGHT COLECTOMY N/A 08/05/2015   Procedure: LAPAROSCOPIC RIGHT COLECTOMY- ASCENDING;  Surgeon: Jina Nephew, MD;  Location: [facility] OR;  Service: General;  Laterality: N/A;   MASS EXCISION Left 05/28/2019   Procedure: EXCISION SOFT TISSUE MASS LEFT SHOULDER;  Surgeon: Eletha Boas, MD;  Location: [facility] ORS;  Service: General;  Laterality: Left;   NEPHRECTOMY Bilateral    PARATHYROIDECTOMY N/A 06/12/2016   Procedure: TOTAL PARATHYROIDECTOMY WITH AUTOTRANSPLANTATION TO LEFT FOREARM;  Surgeon: Boas Eletha, MD;  Location: [facility] OR;  Service: General;  Laterality: N/A;   PARATHYROIDECTOMY N/A 10/23/2016   Procedure: PARATHYROIDECTOMY;  Surgeon: Eletha Boas, MD;  Location: [facility] OR;  Service: General;  Laterality: N/A;   REVERSE SHOULDER ARTHROPLASTY Right 08/24/2020   Procedure: REVERSE SHOULDER ARTHROPLASTY;  Surgeon: Cristy Bonner DASEN, MD;  Location: [facility] OR;  Service: Orthopedics;  Laterality: Right;   REVISON OF ARTERIOVENOUS FISTULA Right 07/16/2017   Procedure: REVISION OF ARTERIOVENOUS FISTULA  Right ARM;  Surgeon: Sheree Penne Bruckner, MD;  Location: [facility] OR;  Service: Vascular;  Laterality: Right;   TOTAL HIP ARTHROPLASTY Left 11/14/2020   Procedure: LEFT TOTAL HIP ARTHROPLASTY ANTERIOR APPROACH;  Surgeon: [name] Kay HERO, MD;  Location: [facility] OR;  Service: Orthopedics;  Laterality: Left;   UPPER GASTROINTESTINAL ENDOSCOPY  2021   @ [facility]   Social History   Occupational History   Occupation: retired    Associate Professor: SELF EMPLOYED  Tobacco Use   Smoking status: Former    Current packs/day: 0.00    Types: Cigarettes    Quit  date: 06/19/1990    Years since quitting: 32.7   Smokeless tobacco: Never   Tobacco comments:    rarely cigar  Vaping Use   Vaping status: Never Used  Substance and Sexual Activity   Alcohol use: Yes    Alcohol/week: 1.0 standard drink of alcohol    Types: 1 Cans of beer per week    Comment: occasional beer   Drug use: Not Currently    Types: Marijuana    Comment: Last use several yrs ago   Sexual activity: Not Currently

## 2023-02-27 ENCOUNTER — Other Ambulatory Visit: Payer: Self-pay | Admitting: Neurology

## 2023-02-27 DIAGNOSIS — N2581 Secondary hyperparathyroidism of renal origin: Secondary | ICD-10-CM | POA: Diagnosis not present

## 2023-02-27 DIAGNOSIS — N186 End stage renal disease: Secondary | ICD-10-CM | POA: Diagnosis not present

## 2023-02-27 DIAGNOSIS — D509 Iron deficiency anemia, unspecified: Secondary | ICD-10-CM | POA: Diagnosis not present

## 2023-02-27 DIAGNOSIS — Z992 Dependence on renal dialysis: Secondary | ICD-10-CM | POA: Diagnosis not present

## 2023-02-27 DIAGNOSIS — R197 Diarrhea, unspecified: Secondary | ICD-10-CM | POA: Diagnosis not present

## 2023-02-27 DIAGNOSIS — E875 Hyperkalemia: Secondary | ICD-10-CM | POA: Diagnosis not present

## 2023-02-27 DIAGNOSIS — E1129 Type 2 diabetes mellitus with other diabetic kidney complication: Secondary | ICD-10-CM | POA: Diagnosis not present

## 2023-02-27 DIAGNOSIS — R519 Headache, unspecified: Secondary | ICD-10-CM | POA: Diagnosis not present

## 2023-02-27 DIAGNOSIS — D689 Coagulation defect, unspecified: Secondary | ICD-10-CM | POA: Diagnosis not present

## 2023-03-01 DIAGNOSIS — R519 Headache, unspecified: Secondary | ICD-10-CM | POA: Diagnosis not present

## 2023-03-01 DIAGNOSIS — E1129 Type 2 diabetes mellitus with other diabetic kidney complication: Secondary | ICD-10-CM | POA: Diagnosis not present

## 2023-03-01 DIAGNOSIS — N186 End stage renal disease: Secondary | ICD-10-CM | POA: Diagnosis not present

## 2023-03-01 DIAGNOSIS — N2581 Secondary hyperparathyroidism of renal origin: Secondary | ICD-10-CM | POA: Diagnosis not present

## 2023-03-01 DIAGNOSIS — D509 Iron deficiency anemia, unspecified: Secondary | ICD-10-CM | POA: Diagnosis not present

## 2023-03-01 DIAGNOSIS — E875 Hyperkalemia: Secondary | ICD-10-CM | POA: Diagnosis not present

## 2023-03-01 DIAGNOSIS — Z992 Dependence on renal dialysis: Secondary | ICD-10-CM | POA: Diagnosis not present

## 2023-03-01 DIAGNOSIS — D689 Coagulation defect, unspecified: Secondary | ICD-10-CM | POA: Diagnosis not present

## 2023-03-01 DIAGNOSIS — R197 Diarrhea, unspecified: Secondary | ICD-10-CM | POA: Diagnosis not present

## 2023-03-04 DIAGNOSIS — R519 Headache, unspecified: Secondary | ICD-10-CM | POA: Diagnosis not present

## 2023-03-04 DIAGNOSIS — N2581 Secondary hyperparathyroidism of renal origin: Secondary | ICD-10-CM | POA: Diagnosis not present

## 2023-03-04 DIAGNOSIS — N186 End stage renal disease: Secondary | ICD-10-CM | POA: Diagnosis not present

## 2023-03-04 DIAGNOSIS — R197 Diarrhea, unspecified: Secondary | ICD-10-CM | POA: Diagnosis not present

## 2023-03-04 DIAGNOSIS — E875 Hyperkalemia: Secondary | ICD-10-CM | POA: Diagnosis not present

## 2023-03-04 DIAGNOSIS — D689 Coagulation defect, unspecified: Secondary | ICD-10-CM | POA: Diagnosis not present

## 2023-03-04 DIAGNOSIS — E1129 Type 2 diabetes mellitus with other diabetic kidney complication: Secondary | ICD-10-CM | POA: Diagnosis not present

## 2023-03-04 DIAGNOSIS — D509 Iron deficiency anemia, unspecified: Secondary | ICD-10-CM | POA: Diagnosis not present

## 2023-03-04 DIAGNOSIS — Z992 Dependence on renal dialysis: Secondary | ICD-10-CM | POA: Diagnosis not present

## 2023-03-05 ENCOUNTER — Other Ambulatory Visit: Payer: Self-pay

## 2023-03-05 DIAGNOSIS — G8929 Other chronic pain: Secondary | ICD-10-CM

## 2023-03-06 DIAGNOSIS — R519 Headache, unspecified: Secondary | ICD-10-CM | POA: Diagnosis not present

## 2023-03-06 DIAGNOSIS — Z992 Dependence on renal dialysis: Secondary | ICD-10-CM | POA: Diagnosis not present

## 2023-03-06 DIAGNOSIS — E1129 Type 2 diabetes mellitus with other diabetic kidney complication: Secondary | ICD-10-CM | POA: Diagnosis not present

## 2023-03-06 DIAGNOSIS — E875 Hyperkalemia: Secondary | ICD-10-CM | POA: Diagnosis not present

## 2023-03-06 DIAGNOSIS — D689 Coagulation defect, unspecified: Secondary | ICD-10-CM | POA: Diagnosis not present

## 2023-03-06 DIAGNOSIS — R197 Diarrhea, unspecified: Secondary | ICD-10-CM | POA: Diagnosis not present

## 2023-03-06 DIAGNOSIS — N186 End stage renal disease: Secondary | ICD-10-CM | POA: Diagnosis not present

## 2023-03-06 DIAGNOSIS — D509 Iron deficiency anemia, unspecified: Secondary | ICD-10-CM | POA: Diagnosis not present

## 2023-03-06 DIAGNOSIS — N2581 Secondary hyperparathyroidism of renal origin: Secondary | ICD-10-CM | POA: Diagnosis not present

## 2023-03-07 ENCOUNTER — Other Ambulatory Visit: Payer: Self-pay | Admitting: *Deleted

## 2023-03-07 ENCOUNTER — Ambulatory Visit (HOSPITAL_COMMUNITY)
Admission: RE | Admit: 2023-03-07 | Discharge: 2023-03-07 | Disposition: A | Discharge status: Home / Self Care | Payer: 59 | Source: Ambulatory Visit | Attending: Vascular Surgery | Admitting: Vascular Surgery

## 2023-03-07 DIAGNOSIS — N186 End stage renal disease: Secondary | ICD-10-CM | POA: Diagnosis not present

## 2023-03-08 DIAGNOSIS — E875 Hyperkalemia: Secondary | ICD-10-CM | POA: Diagnosis not present

## 2023-03-08 DIAGNOSIS — R519 Headache, unspecified: Secondary | ICD-10-CM | POA: Diagnosis not present

## 2023-03-08 DIAGNOSIS — D689 Coagulation defect, unspecified: Secondary | ICD-10-CM | POA: Diagnosis not present

## 2023-03-08 DIAGNOSIS — N186 End stage renal disease: Secondary | ICD-10-CM | POA: Diagnosis not present

## 2023-03-08 DIAGNOSIS — D509 Iron deficiency anemia, unspecified: Secondary | ICD-10-CM | POA: Diagnosis not present

## 2023-03-08 DIAGNOSIS — N2581 Secondary hyperparathyroidism of renal origin: Secondary | ICD-10-CM | POA: Diagnosis not present

## 2023-03-08 DIAGNOSIS — R197 Diarrhea, unspecified: Secondary | ICD-10-CM | POA: Diagnosis not present

## 2023-03-08 DIAGNOSIS — Z992 Dependence on renal dialysis: Secondary | ICD-10-CM | POA: Diagnosis not present

## 2023-03-08 DIAGNOSIS — E1129 Type 2 diabetes mellitus with other diabetic kidney complication: Secondary | ICD-10-CM | POA: Diagnosis not present

## 2023-03-11 DIAGNOSIS — D689 Coagulation defect, unspecified: Secondary | ICD-10-CM | POA: Diagnosis not present

## 2023-03-11 DIAGNOSIS — D509 Iron deficiency anemia, unspecified: Secondary | ICD-10-CM | POA: Diagnosis not present

## 2023-03-11 DIAGNOSIS — R519 Headache, unspecified: Secondary | ICD-10-CM | POA: Diagnosis not present

## 2023-03-11 DIAGNOSIS — E1129 Type 2 diabetes mellitus with other diabetic kidney complication: Secondary | ICD-10-CM | POA: Diagnosis not present

## 2023-03-11 DIAGNOSIS — N186 End stage renal disease: Secondary | ICD-10-CM | POA: Diagnosis not present

## 2023-03-11 DIAGNOSIS — Z992 Dependence on renal dialysis: Secondary | ICD-10-CM | POA: Diagnosis not present

## 2023-03-11 DIAGNOSIS — N2581 Secondary hyperparathyroidism of renal origin: Secondary | ICD-10-CM | POA: Diagnosis not present

## 2023-03-11 DIAGNOSIS — R197 Diarrhea, unspecified: Secondary | ICD-10-CM | POA: Diagnosis not present

## 2023-03-11 DIAGNOSIS — E875 Hyperkalemia: Secondary | ICD-10-CM | POA: Diagnosis not present

## 2023-03-12 ENCOUNTER — Ambulatory Visit: Payer: 59

## 2023-03-12 DIAGNOSIS — G8929 Other chronic pain: Secondary | ICD-10-CM

## 2023-03-12 DIAGNOSIS — M25511 Pain in right shoulder: Secondary | ICD-10-CM | POA: Diagnosis not present

## 2023-03-12 DIAGNOSIS — M25512 Pain in left shoulder: Secondary | ICD-10-CM | POA: Diagnosis not present

## 2023-03-12 NOTE — Progress Notes (Signed)
Patient came in for nurse visit to have labs drawn. Mity did his Labs and I Printed them out for her.

## 2023-03-13 DIAGNOSIS — D509 Iron deficiency anemia, unspecified: Secondary | ICD-10-CM | POA: Diagnosis not present

## 2023-03-13 DIAGNOSIS — R519 Headache, unspecified: Secondary | ICD-10-CM | POA: Diagnosis not present

## 2023-03-13 DIAGNOSIS — R197 Diarrhea, unspecified: Secondary | ICD-10-CM | POA: Diagnosis not present

## 2023-03-13 DIAGNOSIS — D689 Coagulation defect, unspecified: Secondary | ICD-10-CM | POA: Diagnosis not present

## 2023-03-13 DIAGNOSIS — N2581 Secondary hyperparathyroidism of renal origin: Secondary | ICD-10-CM | POA: Diagnosis not present

## 2023-03-13 DIAGNOSIS — E1129 Type 2 diabetes mellitus with other diabetic kidney complication: Secondary | ICD-10-CM | POA: Diagnosis not present

## 2023-03-13 DIAGNOSIS — E875 Hyperkalemia: Secondary | ICD-10-CM | POA: Diagnosis not present

## 2023-03-13 DIAGNOSIS — N186 End stage renal disease: Secondary | ICD-10-CM | POA: Diagnosis not present

## 2023-03-13 DIAGNOSIS — Z992 Dependence on renal dialysis: Secondary | ICD-10-CM | POA: Diagnosis not present

## 2023-03-13 LAB — C-REACTIVE PROTEIN: CRP: 5.2 mg/L (ref <8.0)

## 2023-03-13 LAB — SEDIMENTATION RATE: Sed Rate: 53 mm/h — ABNORMAL HIGH (ref 0–20)

## 2023-03-14 DIAGNOSIS — G63 Polyneuropathy in diseases classified elsewhere: Secondary | ICD-10-CM | POA: Diagnosis not present

## 2023-03-14 DIAGNOSIS — Z0001 Encounter for general adult medical examination with abnormal findings: Secondary | ICD-10-CM | POA: Diagnosis not present

## 2023-03-14 DIAGNOSIS — G5603 Carpal tunnel syndrome, bilateral upper limbs: Secondary | ICD-10-CM | POA: Diagnosis not present

## 2023-03-14 DIAGNOSIS — Z992 Dependence on renal dialysis: Secondary | ICD-10-CM | POA: Diagnosis not present

## 2023-03-14 DIAGNOSIS — H6123 Impacted cerumen, bilateral: Secondary | ICD-10-CM | POA: Diagnosis not present

## 2023-03-14 DIAGNOSIS — N186 End stage renal disease: Secondary | ICD-10-CM | POA: Diagnosis not present

## 2023-03-14 DIAGNOSIS — E7800 Pure hypercholesterolemia, unspecified: Secondary | ICD-10-CM | POA: Diagnosis not present

## 2023-03-15 DIAGNOSIS — I12 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 stage renal disease: Secondary | ICD-10-CM | POA: Diagnosis not present

## 2023-03-15 DIAGNOSIS — D689 Coagulation defect, unspecified: Secondary | ICD-10-CM | POA: Diagnosis not present

## 2023-03-15 DIAGNOSIS — E1129 Type 2 diabetes mellitus with other diabetic kidney complication: Secondary | ICD-10-CM | POA: Diagnosis not present

## 2023-03-15 DIAGNOSIS — Z992 Dependence on renal dialysis: Secondary | ICD-10-CM | POA: Diagnosis not present

## 2023-03-15 DIAGNOSIS — D509 Iron deficiency anemia, unspecified: Secondary | ICD-10-CM | POA: Diagnosis not present

## 2023-03-15 DIAGNOSIS — N186 End stage renal disease: Secondary | ICD-10-CM | POA: Diagnosis not present

## 2023-03-15 DIAGNOSIS — E875 Hyperkalemia: Secondary | ICD-10-CM | POA: Diagnosis not present

## 2023-03-15 DIAGNOSIS — R197 Diarrhea, unspecified: Secondary | ICD-10-CM | POA: Diagnosis not present

## 2023-03-15 DIAGNOSIS — R519 Headache, unspecified: Secondary | ICD-10-CM | POA: Diagnosis not present

## 2023-03-15 DIAGNOSIS — N2581 Secondary hyperparathyroidism of renal origin: Secondary | ICD-10-CM | POA: Diagnosis not present

## 2023-03-18 DIAGNOSIS — N186 End stage renal disease: Secondary | ICD-10-CM | POA: Diagnosis not present

## 2023-03-18 DIAGNOSIS — N2581 Secondary hyperparathyroidism of renal origin: Secondary | ICD-10-CM | POA: Diagnosis not present

## 2023-03-18 DIAGNOSIS — E875 Hyperkalemia: Secondary | ICD-10-CM | POA: Diagnosis not present

## 2023-03-18 DIAGNOSIS — D689 Coagulation defect, unspecified: Secondary | ICD-10-CM | POA: Diagnosis not present

## 2023-03-18 DIAGNOSIS — Z992 Dependence on renal dialysis: Secondary | ICD-10-CM | POA: Diagnosis not present

## 2023-03-19 DIAGNOSIS — H6123 Impacted cerumen, bilateral: Secondary | ICD-10-CM | POA: Diagnosis not present

## 2023-03-20 DIAGNOSIS — Z992 Dependence on renal dialysis: Secondary | ICD-10-CM | POA: Diagnosis not present

## 2023-03-20 DIAGNOSIS — N186 End stage renal disease: Secondary | ICD-10-CM | POA: Diagnosis not present

## 2023-03-20 DIAGNOSIS — D689 Coagulation defect, unspecified: Secondary | ICD-10-CM | POA: Diagnosis not present

## 2023-03-20 DIAGNOSIS — E875 Hyperkalemia: Secondary | ICD-10-CM | POA: Diagnosis not present

## 2023-03-20 DIAGNOSIS — N2581 Secondary hyperparathyroidism of renal origin: Secondary | ICD-10-CM | POA: Diagnosis not present

## 2023-03-21 DIAGNOSIS — M25511 Pain in right shoulder: Secondary | ICD-10-CM | POA: Diagnosis not present

## 2023-03-22 DIAGNOSIS — Z992 Dependence on renal dialysis: Secondary | ICD-10-CM | POA: Diagnosis not present

## 2023-03-22 DIAGNOSIS — D689 Coagulation defect, unspecified: Secondary | ICD-10-CM | POA: Diagnosis not present

## 2023-03-22 DIAGNOSIS — N186 End stage renal disease: Secondary | ICD-10-CM | POA: Diagnosis not present

## 2023-03-22 DIAGNOSIS — E875 Hyperkalemia: Secondary | ICD-10-CM | POA: Diagnosis not present

## 2023-03-22 DIAGNOSIS — N2581 Secondary hyperparathyroidism of renal origin: Secondary | ICD-10-CM | POA: Diagnosis not present

## 2023-03-25 DIAGNOSIS — Z992 Dependence on renal dialysis: Secondary | ICD-10-CM | POA: Diagnosis not present

## 2023-03-25 DIAGNOSIS — E875 Hyperkalemia: Secondary | ICD-10-CM | POA: Diagnosis not present

## 2023-03-25 DIAGNOSIS — N2581 Secondary hyperparathyroidism of renal origin: Secondary | ICD-10-CM | POA: Diagnosis not present

## 2023-03-25 DIAGNOSIS — D689 Coagulation defect, unspecified: Secondary | ICD-10-CM | POA: Diagnosis not present

## 2023-03-25 DIAGNOSIS — N186 End stage renal disease: Secondary | ICD-10-CM | POA: Diagnosis not present

## 2023-03-25 DIAGNOSIS — M25511 Pain in right shoulder: Secondary | ICD-10-CM | POA: Diagnosis not present

## 2023-03-26 DIAGNOSIS — Z992 Dependence on renal dialysis: Secondary | ICD-10-CM | POA: Diagnosis not present

## 2023-03-26 DIAGNOSIS — D689 Coagulation defect, unspecified: Secondary | ICD-10-CM | POA: Diagnosis not present

## 2023-03-26 DIAGNOSIS — N2581 Secondary hyperparathyroidism of renal origin: Secondary | ICD-10-CM | POA: Diagnosis not present

## 2023-03-26 DIAGNOSIS — E875 Hyperkalemia: Secondary | ICD-10-CM | POA: Diagnosis not present

## 2023-03-26 DIAGNOSIS — N186 End stage renal disease: Secondary | ICD-10-CM | POA: Diagnosis not present

## 2023-03-27 DIAGNOSIS — N2581 Secondary hyperparathyroidism of renal origin: Secondary | ICD-10-CM | POA: Diagnosis not present

## 2023-03-27 DIAGNOSIS — Z992 Dependence on renal dialysis: Secondary | ICD-10-CM | POA: Diagnosis not present

## 2023-03-27 DIAGNOSIS — N186 End stage renal disease: Secondary | ICD-10-CM | POA: Diagnosis not present

## 2023-03-27 DIAGNOSIS — E875 Hyperkalemia: Secondary | ICD-10-CM | POA: Diagnosis not present

## 2023-03-27 DIAGNOSIS — D689 Coagulation defect, unspecified: Secondary | ICD-10-CM | POA: Diagnosis not present

## 2023-03-28 ENCOUNTER — Ambulatory Visit
Admission: RE | Admit: 2023-03-28 | Discharge: 2023-03-28 | Disposition: A | Discharge status: Home / Self Care | Payer: 59 | Source: Ambulatory Visit | Attending: Orthopedic Surgery | Admitting: Orthopedic Surgery

## 2023-03-28 DIAGNOSIS — T85698A Other mechanical complication of other specified internal prosthetic devices, implants and grafts, initial encounter: Secondary | ICD-10-CM

## 2023-03-28 DIAGNOSIS — Z96611 Presence of right artificial shoulder joint: Secondary | ICD-10-CM | POA: Diagnosis not present

## 2023-03-28 DIAGNOSIS — T84038A Mechanical loosening of other internal prosthetic joint, initial encounter: Secondary | ICD-10-CM | POA: Diagnosis not present

## 2023-03-29 ENCOUNTER — Ambulatory Visit (INDEPENDENT_AMBULATORY_CARE_PROVIDER_SITE_OTHER): Payer: 59

## 2023-03-29 DIAGNOSIS — Z992 Dependence on renal dialysis: Secondary | ICD-10-CM | POA: Diagnosis not present

## 2023-03-29 DIAGNOSIS — E1151 Type 2 diabetes mellitus with diabetic peripheral angiopathy without gangrene: Secondary | ICD-10-CM

## 2023-03-29 DIAGNOSIS — N186 End stage renal disease: Secondary | ICD-10-CM | POA: Diagnosis not present

## 2023-03-29 DIAGNOSIS — E875 Hyperkalemia: Secondary | ICD-10-CM | POA: Diagnosis not present

## 2023-03-29 DIAGNOSIS — M2142 Flat foot [pes planus] (acquired), left foot: Secondary | ICD-10-CM | POA: Diagnosis not present

## 2023-03-29 DIAGNOSIS — D689 Coagulation defect, unspecified: Secondary | ICD-10-CM | POA: Diagnosis not present

## 2023-03-29 DIAGNOSIS — M2141 Flat foot [pes planus] (acquired), right foot: Secondary | ICD-10-CM | POA: Diagnosis not present

## 2023-03-29 DIAGNOSIS — M2041 Other hammer toe(s) (acquired), right foot: Secondary | ICD-10-CM | POA: Diagnosis not present

## 2023-03-29 DIAGNOSIS — N2581 Secondary hyperparathyroidism of renal origin: Secondary | ICD-10-CM | POA: Diagnosis not present

## 2023-03-29 NOTE — Progress Notes (Signed)
  Patient presents today to pick up diabetic shoes and insoles.  Patient was dispensed 1 pair of diabetic shoes and 3 pairs of foam casted diabetic insoles. Fit was satisfactory. Instructions for break-in and wear was reviewed and a copy was given to the patient.   Re-appointment for regularly scheduled diabetic foot care visits or if they should experience any trouble with the shoes or insoles.   Patient ordered 2nd pair $200 please collect patient can just PU when in  [name] CPed, CFo, CFm

## 2023-04-01 DIAGNOSIS — Z992 Dependence on renal dialysis: Secondary | ICD-10-CM | POA: Diagnosis not present

## 2023-04-01 DIAGNOSIS — E875 Hyperkalemia: Secondary | ICD-10-CM | POA: Diagnosis not present

## 2023-04-01 DIAGNOSIS — D689 Coagulation defect, unspecified: Secondary | ICD-10-CM | POA: Diagnosis not present

## 2023-04-01 DIAGNOSIS — N2581 Secondary hyperparathyroidism of renal origin: Secondary | ICD-10-CM | POA: Diagnosis not present

## 2023-04-01 DIAGNOSIS — N186 End stage renal disease: Secondary | ICD-10-CM | POA: Diagnosis not present

## 2023-04-02 DIAGNOSIS — M25511 Pain in right shoulder: Secondary | ICD-10-CM | POA: Diagnosis not present

## 2023-04-03 ENCOUNTER — Encounter: Payer: 59 | Admitting: Vascular Surgery

## 2023-04-03 DIAGNOSIS — E875 Hyperkalemia: Secondary | ICD-10-CM | POA: Diagnosis not present

## 2023-04-03 DIAGNOSIS — N186 End stage renal disease: Secondary | ICD-10-CM | POA: Diagnosis not present

## 2023-04-03 DIAGNOSIS — D689 Coagulation defect, unspecified: Secondary | ICD-10-CM | POA: Diagnosis not present

## 2023-04-03 DIAGNOSIS — Z992 Dependence on renal dialysis: Secondary | ICD-10-CM | POA: Diagnosis not present

## 2023-04-03 DIAGNOSIS — N2581 Secondary hyperparathyroidism of renal origin: Secondary | ICD-10-CM | POA: Diagnosis not present

## 2023-04-05 DIAGNOSIS — Z992 Dependence on renal dialysis: Secondary | ICD-10-CM | POA: Diagnosis not present

## 2023-04-05 DIAGNOSIS — E875 Hyperkalemia: Secondary | ICD-10-CM | POA: Diagnosis not present

## 2023-04-05 DIAGNOSIS — N186 End stage renal disease: Secondary | ICD-10-CM | POA: Diagnosis not present

## 2023-04-05 DIAGNOSIS — D689 Coagulation defect, unspecified: Secondary | ICD-10-CM | POA: Diagnosis not present

## 2023-04-05 DIAGNOSIS — N2581 Secondary hyperparathyroidism of renal origin: Secondary | ICD-10-CM | POA: Diagnosis not present

## 2023-04-08 ENCOUNTER — Telehealth: Payer: Self-pay

## 2023-04-08 DIAGNOSIS — N2581 Secondary hyperparathyroidism of renal origin: Secondary | ICD-10-CM | POA: Diagnosis not present

## 2023-04-08 DIAGNOSIS — N186 End stage renal disease: Secondary | ICD-10-CM | POA: Diagnosis not present

## 2023-04-08 DIAGNOSIS — D689 Coagulation defect, unspecified: Secondary | ICD-10-CM | POA: Diagnosis not present

## 2023-04-08 DIAGNOSIS — Z992 Dependence on renal dialysis: Secondary | ICD-10-CM | POA: Diagnosis not present

## 2023-04-08 DIAGNOSIS — E875 Hyperkalemia: Secondary | ICD-10-CM | POA: Diagnosis not present

## 2023-04-08 NOTE — Telephone Encounter (Signed)
-----   Message from Burnard Bunting sent at 04/07/2023  1:12 PM EST ----- This is the patient I was telling you about - pls call and have him see brandon cain sooner rather than later for right arm fistula eval prior to revision  thx

## 2023-04-08 NOTE — Telephone Encounter (Signed)
 Can we please check on the referral for Dr August Saucer?

## 2023-04-09 DIAGNOSIS — M25511 Pain in right shoulder: Secondary | ICD-10-CM | POA: Diagnosis not present

## 2023-04-10 DIAGNOSIS — D689 Coagulation defect, unspecified: Secondary | ICD-10-CM | POA: Diagnosis not present

## 2023-04-10 DIAGNOSIS — E875 Hyperkalemia: Secondary | ICD-10-CM | POA: Diagnosis not present

## 2023-04-10 DIAGNOSIS — N186 End stage renal disease: Secondary | ICD-10-CM | POA: Diagnosis not present

## 2023-04-10 DIAGNOSIS — Z992 Dependence on renal dialysis: Secondary | ICD-10-CM | POA: Diagnosis not present

## 2023-04-10 DIAGNOSIS — N2581 Secondary hyperparathyroidism of renal origin: Secondary | ICD-10-CM | POA: Diagnosis not present

## 2023-04-10 NOTE — Telephone Encounter (Signed)
Perfect- thanks for checking!

## 2023-04-12 ENCOUNTER — Other Ambulatory Visit: Payer: Self-pay | Admitting: Physician Assistant

## 2023-04-12 DIAGNOSIS — Z992 Dependence on renal dialysis: Secondary | ICD-10-CM | POA: Diagnosis not present

## 2023-04-12 DIAGNOSIS — N186 End stage renal disease: Secondary | ICD-10-CM | POA: Diagnosis not present

## 2023-04-12 DIAGNOSIS — I12 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 stage renal disease: Secondary | ICD-10-CM | POA: Diagnosis not present

## 2023-04-12 DIAGNOSIS — E875 Hyperkalemia: Secondary | ICD-10-CM | POA: Diagnosis not present

## 2023-04-12 DIAGNOSIS — N2581 Secondary hyperparathyroidism of renal origin: Secondary | ICD-10-CM | POA: Diagnosis not present

## 2023-04-12 DIAGNOSIS — D689 Coagulation defect, unspecified: Secondary | ICD-10-CM | POA: Diagnosis not present

## 2023-04-15 DIAGNOSIS — D689 Coagulation defect, unspecified: Secondary | ICD-10-CM | POA: Diagnosis not present

## 2023-04-15 DIAGNOSIS — N186 End stage renal disease: Secondary | ICD-10-CM | POA: Diagnosis not present

## 2023-04-15 DIAGNOSIS — R519 Headache, unspecified: Secondary | ICD-10-CM | POA: Diagnosis not present

## 2023-04-15 DIAGNOSIS — E875 Hyperkalemia: Secondary | ICD-10-CM | POA: Diagnosis not present

## 2023-04-15 DIAGNOSIS — Z992 Dependence on renal dialysis: Secondary | ICD-10-CM | POA: Diagnosis not present

## 2023-04-15 DIAGNOSIS — N2581 Secondary hyperparathyroidism of renal origin: Secondary | ICD-10-CM | POA: Diagnosis not present

## 2023-04-15 NOTE — Telephone Encounter (Signed)
Called patient no answer. LMOM with details.  

## 2023-04-15 NOTE — Telephone Encounter (Signed)
 I'll send in one more rx and will then need to refer to pain mgmt unless another doc wants to take over

## 2023-04-16 ENCOUNTER — Telehealth: Payer: Self-pay

## 2023-04-16 DIAGNOSIS — E1151 Type 2 diabetes mellitus with diabetic peripheral angiopathy without gangrene: Secondary | ICD-10-CM

## 2023-04-16 NOTE — Telephone Encounter (Signed)
 LEFT VM TO PT TO LET HIM KNOW HIS SECOND PAIR OF SHOES ARE IN, AND THAT HE WILL HAVE TO PAY $200.

## 2023-04-17 ENCOUNTER — Other Ambulatory Visit: Payer: 59

## 2023-04-17 DIAGNOSIS — Z992 Dependence on renal dialysis: Secondary | ICD-10-CM | POA: Diagnosis not present

## 2023-04-17 DIAGNOSIS — D689 Coagulation defect, unspecified: Secondary | ICD-10-CM | POA: Diagnosis not present

## 2023-04-17 DIAGNOSIS — R519 Headache, unspecified: Secondary | ICD-10-CM | POA: Diagnosis not present

## 2023-04-17 DIAGNOSIS — N2581 Secondary hyperparathyroidism of renal origin: Secondary | ICD-10-CM | POA: Diagnosis not present

## 2023-04-17 DIAGNOSIS — N186 End stage renal disease: Secondary | ICD-10-CM | POA: Diagnosis not present

## 2023-04-17 DIAGNOSIS — E875 Hyperkalemia: Secondary | ICD-10-CM | POA: Diagnosis not present

## 2023-04-18 DIAGNOSIS — Z79899 Other long term (current) drug therapy: Secondary | ICD-10-CM | POA: Diagnosis not present

## 2023-04-18 DIAGNOSIS — M25511 Pain in right shoulder: Secondary | ICD-10-CM | POA: Diagnosis not present

## 2023-04-19 DIAGNOSIS — D689 Coagulation defect, unspecified: Secondary | ICD-10-CM | POA: Diagnosis not present

## 2023-04-19 DIAGNOSIS — E875 Hyperkalemia: Secondary | ICD-10-CM | POA: Diagnosis not present

## 2023-04-19 DIAGNOSIS — Z992 Dependence on renal dialysis: Secondary | ICD-10-CM | POA: Diagnosis not present

## 2023-04-19 DIAGNOSIS — N186 End stage renal disease: Secondary | ICD-10-CM | POA: Diagnosis not present

## 2023-04-19 DIAGNOSIS — R519 Headache, unspecified: Secondary | ICD-10-CM | POA: Diagnosis not present

## 2023-04-19 DIAGNOSIS — N2581 Secondary hyperparathyroidism of renal origin: Secondary | ICD-10-CM | POA: Diagnosis not present

## 2023-04-22 DIAGNOSIS — R519 Headache, unspecified: Secondary | ICD-10-CM | POA: Diagnosis not present

## 2023-04-22 DIAGNOSIS — N186 End stage renal disease: Secondary | ICD-10-CM | POA: Diagnosis not present

## 2023-04-22 DIAGNOSIS — D689 Coagulation defect, unspecified: Secondary | ICD-10-CM | POA: Diagnosis not present

## 2023-04-22 DIAGNOSIS — Z992 Dependence on renal dialysis: Secondary | ICD-10-CM | POA: Diagnosis not present

## 2023-04-22 DIAGNOSIS — E875 Hyperkalemia: Secondary | ICD-10-CM | POA: Diagnosis not present

## 2023-04-22 DIAGNOSIS — N2581 Secondary hyperparathyroidism of renal origin: Secondary | ICD-10-CM | POA: Diagnosis not present

## 2023-04-23 ENCOUNTER — Telehealth: Payer: Self-pay | Admitting: Podiatry

## 2023-04-23 ENCOUNTER — Encounter: Payer: 59 | Admitting: Podiatry

## 2023-04-23 NOTE — Progress Notes (Signed)
Patient did not show for scheduled appointment today.

## 2023-04-23 NOTE — Telephone Encounter (Signed)
 Pt called and apologized he missed his appt today but had an issue with dialysis and had to go back today.  He is r/s fpr 3/24.

## 2023-04-24 ENCOUNTER — Ambulatory Visit (INDEPENDENT_AMBULATORY_CARE_PROVIDER_SITE_OTHER): Payer: 59 | Admitting: Vascular Surgery

## 2023-04-24 ENCOUNTER — Encounter: Payer: Self-pay | Admitting: Vascular Surgery

## 2023-04-24 VITALS — BP 139/80 | HR 95 | Temp 98.7°F | Ht 73.0 in | Wt 190.0 lb

## 2023-04-24 DIAGNOSIS — E875 Hyperkalemia: Secondary | ICD-10-CM | POA: Diagnosis not present

## 2023-04-24 DIAGNOSIS — D689 Coagulation defect, unspecified: Secondary | ICD-10-CM | POA: Diagnosis not present

## 2023-04-24 DIAGNOSIS — N2581 Secondary hyperparathyroidism of renal origin: Secondary | ICD-10-CM | POA: Diagnosis not present

## 2023-04-24 DIAGNOSIS — R519 Headache, unspecified: Secondary | ICD-10-CM | POA: Diagnosis not present

## 2023-04-24 DIAGNOSIS — Z992 Dependence on renal dialysis: Secondary | ICD-10-CM | POA: Diagnosis not present

## 2023-04-24 DIAGNOSIS — N186 End stage renal disease: Secondary | ICD-10-CM

## 2023-04-24 NOTE — Progress Notes (Signed)
 Patient ID: Carl Stigger Sr., male   DOB: 10-28-1960, 63 y.o.   MRN: [id number]  Reason for Consult: New Patient (Initial Visit)   Referred by Cammy Copa, MD  Subjective:     HPI:  Carl Pulsifer Sr. is a 63 y.o. male history of via right forearm AV fistula.  He is scheduled for right shoulder surgery and is here for further evaluation of his upper arm venous outflow.  He is having significant pain in the right shoulder as well as the left hand secondary to carpal tunnel syndrome.  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    Parathyroid abnormality (HCC)    ectopic parathyroid gland   Presence of arteriovenous fistula for hemodialysis, primary (HCC)    RUE PER PT RLE   Refusal of blood product    NO WHOLE BLOOD PROUCTS   Renal cell carcinoma (HCC)    s/p hand assisted laparoscopic bilateral nephrectomies 11/29/17, + RCC left   Secondary hyperparathyroidism (HCC)    Seizures (HCC)    one episode in past, due to" elevated Potassium" 08/02/20- "at least 4 years ago"   Sleep apnea    doesn't use CPAP anymore since weight loss   Stroke (HCC)    no residual   Family History  Problem Relation Age of Onset   Diabetes Father    Stroke Father    Hypertension Father    Uterine cancer Mother    Lupus Sister    Stroke Sister    Hypertension Sister    Anuerysm Brother        brain   Colon cancer Neg Hx    Esophageal cancer Neg Hx    Stomach  cancer Neg Hx    Pancreatic cancer Neg Hx    Liver disease Neg Hx    Colon polyps Neg Hx    Rectal cancer Neg Hx    Past Surgical History:  Procedure Laterality Date   AV FISTULA PLACEMENT Right    right arm   BIOPSY  08/01/2019   Procedure: BIOPSY;  Surgeon: Charna Elizabeth, MD;  Location: [facility] ENDOSCOPY;  Service: Endoscopy;;   BIOPSY  11/18/2020   Procedure: BIOPSY;  Surgeon: Jenel Lucks, MD;  Location: [facility] ENDOSCOPY;  Service: Gastroenterology;;   BIOPSY  09/13/2021   Procedure: BIOPSY;  Surgeon: Lemar Lofty., MD;  Location: [facility] ENDOSCOPY;  Service: Gastroenterology;;   CATARACT EXTRACTION W/ INTRAOCULAR LENS  IMPLANT, BILATERAL     COLON SURGERY     COLONOSCOPY N/A 08/04/2015   Procedure: COLONOSCOPY;  Surgeon: Beverley Fiedler, MD;  Location: [facility] ENDOSCOPY;  Service: Endoscopy;  Laterality: N/A;   COLONOSCOPY  2017   JMP@ [facility]-good prep-mass -recall 1 yr   COLONOSCOPY N/A 09/13/2021   Procedure: COLONOSCOPY;  Surgeon: Mansouraty,  Netty Starring., MD;  Location: [facility] ENDOSCOPY;  Service: Gastroenterology;  Laterality: N/A;   COLONOSCOPY WITH PROPOFOL N/A 11/25/2020   Procedure: COLONOSCOPY WITH PROPOFOL;  Surgeon: Imogene Burn, MD;  Location: [facility] ENDOSCOPY;  Service: Gastroenterology;  Laterality: N/A;   COLONOSCOPY WITH PROPOFOL N/A 09/23/2021   Procedure: COLONOSCOPY WITH PROPOFOL;  Surgeon: Shellia Cleverly, DO;  Location: [facility] ENDOSCOPY;  Service: Gastroenterology;  Laterality: N/A;   ENTEROSCOPY N/A 09/23/2021   Procedure: ENTEROSCOPY;  Surgeon: Shellia Cleverly, DO;  Location: [facility] ENDOSCOPY;  Service: Gastroenterology;  Laterality: N/A;  Will place order for video capsule study as we may opt to place it during procedure   ESOPHAGOGASTRODUODENOSCOPY N/A 08/01/2019   Procedure: ESOPHAGOGASTRODUODENOSCOPY (EGD);  Surgeon: Charna Elizabeth, MD;  Location: [facility] ENDOSCOPY;  Service: Endoscopy;  Laterality: N/A;   ESOPHAGOGASTRODUODENOSCOPY N/A 11/18/2020   Procedure:  ESOPHAGOGASTRODUODENOSCOPY (EGD);  Surgeon: Jenel Lucks, MD;  Location: [facility] ENDOSCOPY;  Service: Gastroenterology;  Laterality: N/A;   ESOPHAGOGASTRODUODENOSCOPY (EGD) WITH PROPOFOL N/A 08/04/2019   Procedure: ESOPHAGOGASTRODUODENOSCOPY (EGD) WITH PROPOFOL;  Surgeon: Benancio Deeds, MD;  Location: [facility] ENDOSCOPY;  Service: Gastroenterology;  Laterality: N/A;   ESOPHAGOGASTRODUODENOSCOPY (EGD) WITH PROPOFOL N/A 09/13/2021   Procedure: ESOPHAGOGASTRODUODENOSCOPY (EGD) WITH PROPOFOL;  Surgeon: Meridee Score Netty Starring., MD;  Location: [facility] ENDOSCOPY;  Service: Gastroenterology;  Laterality: N/A;   GIVENS CAPSULE STUDY N/A 09/23/2021   Procedure: GIVENS CAPSULE STUDY;  Surgeon: Shellia Cleverly, DO;  [facility]: [facility] ENDOSCOPY;  Service: Gastroenterology;  Laterality: N/A;   graft left arm Left    for dialysis x 2. Removed   HOT HEMOSTASIS  11/18/2020   Procedure: HOT HEMOSTASIS (ARGON PLASMA COAGULATION/BICAP);  Surgeon: Jenel Lucks, MD;  Location: [facility] ENDOSCOPY;  Service: Gastroenterology;;   HOT HEMOSTASIS N/A 09/23/2021   Procedure: HOT HEMOSTASIS (ARGON PLASMA COAGULATION/BICAP);  Surgeon: Shellia Cleverly, DO;  Location: [facility] ENDOSCOPY;  Service: Gastroenterology;  Laterality: N/A;   INSERTION OF DIALYSIS CATHETER     Rt chest   LAPAROSCOPIC RIGHT COLECTOMY N/A 08/05/2015   Procedure: LAPAROSCOPIC RIGHT COLECTOMY- ASCENDING;  Surgeon: Almond Lint, MD;  Location: [facility] OR;  Service: General;  Laterality: N/A;   MASS EXCISION Left 05/28/2019   Procedure: EXCISION SOFT TISSUE MASS LEFT SHOULDER;  Surgeon: Darnell Level, MD;  Location: [facility] ORS;  Service: General;  Laterality: Left;   NEPHRECTOMY Bilateral    PARATHYROIDECTOMY N/A 06/12/2016   Procedure: TOTAL PARATHYROIDECTOMY WITH AUTOTRANSPLANTATION TO LEFT FOREARM;  Surgeon: Darnell Level, MD;  Location: [facility] OR;  Service: General;  Laterality: N/A;   PARATHYROIDECTOMY N/A 10/23/2016   Procedure: PARATHYROIDECTOMY;  Surgeon: Darnell Level, MD;   Location: [facility] OR;  Service: General;  Laterality: N/A;   REVERSE SHOULDER ARTHROPLASTY Right 08/24/2020   Procedure: REVERSE SHOULDER ARTHROPLASTY;  Surgeon: Bjorn Pippin, MD;  Location: [facility] OR;  Service: Orthopedics;  Laterality: Right;   REVISON OF ARTERIOVENOUS FISTULA Right 07/16/2017   Procedure: REVISION OF ARTERIOVENOUS FISTULA  Right ARM;  Surgeon: Maeola Harman, MD;  Location: [facility] OR;  Service: Vascular;  Laterality: Right;   TOTAL HIP ARTHROPLASTY Left 11/14/2020   Procedure: LEFT TOTAL HIP ARTHROPLASTY ANTERIOR APPROACH;  Surgeon: Tarry Kos, MD;  Location: [facility] OR;  Service: Orthopedics;  Laterality: Left;   UPPER GASTROINTESTINAL ENDOSCOPY  2021   @ [facility]    Short Social History:  Social History   Tobacco Use   Smoking status: Former    Current packs/day: 0.00    Types: Cigarettes    Quit date: 06/19/1990  Years since quitting: 32.8   Smokeless tobacco: Never   Tobacco comments:    rarely cigar  Substance Use Topics   Alcohol use: Yes    Alcohol/week: 1.0 standard drink of alcohol    Types: 1 Cans of beer per week    Comment: occasional beer    Allergies  Allergen Reactions   Infed [Iron Dextran] Other (See Comments)    Decreased BP    Oxycodone Nausea Only   Vicodin [Hydrocodone-Acetaminophen] Other (See Comments)    Decreased BP   Diclofenac Other (See Comments)    Unknown    Current Outpatient Medications  Medication Sig Dispense Refill   acetaminophen (TYLENOL) 650 MG CR tablet Take 1,300 mg by mouth every 8 (eight) hours as needed for pain.     amoxicillin (AMOXIL) 500 MG capsule Take 4 pills one hour prior to dental work 12 capsule 3   atorvastatin (LIPITOR) 10 MG tablet Take 10 mg by mouth every Thursday.     augmented betamethasone dipropionate (DIPROLENE-AF) 0.05 % ointment Apply 1 Application topically 2 (two) times daily as needed for rash.     cinacalcet (SENSIPAR) 60 MG tablet Take 120 mg by mouth daily with supper.      ciprofloxacin-dexamethasone (CIPRODEX) OTIC suspension Place 4 drops into the left ear daily.     cromolyn (OPTICROM) 4 % ophthalmic solution Place 1 drop into both eyes daily as needed (redness).     Docusate Sodium (DSS) 100 MG CAPS Take 100 mg by mouth daily as needed (constipation.).     folic acid (FOLVITE) 1 MG tablet Take 1 tablet (1 mg total) by mouth daily. 30 tablet 0   gabapentin (NEURONTIN) 100 MG capsule Take 300 mg by mouth at bedtime.     LOKELMA 10 g PACK packet Take 1 packet by mouth daily.     multivitamin (RENA-VIT) TABS tablet Take 1 tablet by mouth daily.     oxyCODONE-acetaminophen (PERCOCET/ROXICET) 5-325 MG tablet Take 1-2 tablets by mouth every 6 (six) hours as needed.     pantoprazole (PROTONIX) 40 MG tablet Take 1 tablet (40 mg total) by mouth 2 (two) times daily before a meal. 60 tablet 1   sevelamer carbonate (RENVELA) 800 MG tablet Take 1,600-4,000 mg by mouth See admin instructions. Take 2-3 tablets (1600 mg-2400 mg) by mouth with each snack & take 4-5 tablets (3200 mg-4000 mg) by mouth with meals     sucralfate (CARAFATE) 1 GM/10ML suspension Take 10 mLs (1 g total) by mouth 2 (two) times daily. (Patient taking differently: Take 1 g by mouth 2 (two) times daily as needed (indigestion/upset stomach.).) 420 mL 0   traMADol (ULTRAM) 50 MG tablet TAKE 1 TABLET BY MOUTH EVERY 12 HOURS AS NEEDED 30 tablet 0   vitamin B-12 (CYANOCOBALAMIN) 1000 MCG tablet Take 1,000 mcg by mouth 2 (two) times a week.     vitamin E 180 MG (400 UNITS) capsule Take 400 Units by mouth 3 (three) times a week.     No current facility-administered medications for this visit.    Review of Systems  Constitutional:  Constitutional negative. HENT: HENT negative.  Eyes: Eyes negative.  Respiratory: Respiratory negative.  Cardiovascular: Cardiovascular negative.  GI: Gastrointestinal negative.  Musculoskeletal: Positive for joint pain.       Right shoulder pain and weakness Left hand pain and  weakness  Skin: Skin negative.  Neurological: Neurological negative. Hematologic: Hematologic/lymphatic negative.  Psychiatric: Psychiatric negative.        Objective:  Objective   Vitals:   04/24/23 0950  BP: 139/80  Pulse: 95  Temp: 98.7 F (37.1 C)  SpO2: 93%  Weight: 190 lb (86.2 kg)  Height: 6\' 1"  (1.854 m)   Body mass index is 25.07 kg/m.  Physical Exam HENT:     Head: Normocephalic.     Nose: Nose normal.  Eyes:     Pupils: Pupils are equal, round, and reactive to light.  Cardiovascular:     Rate and Rhythm: Normal rate.  Abdominal:     General: Abdomen is flat.     Palpations: Abdomen is soft.  Musculoskeletal:     Cervical back: Normal range of motion.     Comments: Strong flow right forearm AV fistula  Skin:    Capillary Refill: Capillary refill takes less than 2 seconds.  Neurological:     General: No focal deficit present.     Mental Status: He is alert.  Psychiatric:        Mood and Affect: Mood normal.     Data: DIALYSIS ACCESS   Patient Name:  Carl Hoe Sr.  Date of Exam:   03/07/2023  Medical Rec #: [id number]            Accession #:    [id number]  Date of Birth: July 30, 1960             Patient Gender: M  Patient Age:   64 years  Exam Location:  [facility]  Procedure:      VAS US DUPLEX DIALYSIS ACCESS (AVF, AVG)  Referring Phys: Lemar Livings    ---------------------------------------------------------------------------  -----    Reason for Exam: Pre-operative scan of AVF prior to shoulder surgery.   Access Site: Right Upper Extremity.   Access Type: Radial-cephalic AVF.   Performing Technologist: Argentina Ponder RVS     Examination Guidelines: A complete evaluation includes B-mode imaging,  spectral  Doppler, color Doppler, and power Doppler as needed of all accessible  portions  of each vessel. Unilateral testing is considered an integral part of a  complete  examination. Limited examinations for  reoccurring indications may be  performed  as noted.     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 AVF drain into the brachial vein at the [facility] and dumps in to the  basilic vein in the distal upper arm.      Assessment/Plan:    63 year old male here for evaluation of right arm AV fistula with plans for right shoulder surgery which would likely require ligation of the cephalic vein.  Thankfully the fistula appears to dive deep into the basilic and deep brachial system there is no evidence of flow through the official cephalic vein in the upper arm and as such he should be able to proceed with surgery with limited increased risk from a vascular standpoint.  I have discussed the results of this visit with Dr. August Saucer and will attempt availability at the time of his surgery.     Maeola Harman MD [facility]

## 2023-04-25 DIAGNOSIS — M25511 Pain in right shoulder: Secondary | ICD-10-CM | POA: Diagnosis not present

## 2023-04-26 DIAGNOSIS — N2581 Secondary hyperparathyroidism of renal origin: Secondary | ICD-10-CM | POA: Diagnosis not present

## 2023-04-26 DIAGNOSIS — N186 End stage renal disease: Secondary | ICD-10-CM | POA: Diagnosis not present

## 2023-04-26 DIAGNOSIS — R519 Headache, unspecified: Secondary | ICD-10-CM | POA: Diagnosis not present

## 2023-04-26 DIAGNOSIS — D689 Coagulation defect, unspecified: Secondary | ICD-10-CM | POA: Diagnosis not present

## 2023-04-26 DIAGNOSIS — Z992 Dependence on renal dialysis: Secondary | ICD-10-CM | POA: Diagnosis not present

## 2023-04-26 DIAGNOSIS — E875 Hyperkalemia: Secondary | ICD-10-CM | POA: Diagnosis not present

## 2023-04-29 ENCOUNTER — Telehealth: Payer: Self-pay

## 2023-04-29 DIAGNOSIS — N186 End stage renal disease: Secondary | ICD-10-CM | POA: Diagnosis not present

## 2023-04-29 DIAGNOSIS — D689 Coagulation defect, unspecified: Secondary | ICD-10-CM | POA: Diagnosis not present

## 2023-04-29 DIAGNOSIS — R519 Headache, unspecified: Secondary | ICD-10-CM | POA: Diagnosis not present

## 2023-04-29 DIAGNOSIS — N2581 Secondary hyperparathyroidism of renal origin: Secondary | ICD-10-CM | POA: Diagnosis not present

## 2023-04-29 DIAGNOSIS — Z992 Dependence on renal dialysis: Secondary | ICD-10-CM | POA: Diagnosis not present

## 2023-04-29 DIAGNOSIS — E875 Hyperkalemia: Secondary | ICD-10-CM | POA: Diagnosis not present

## 2023-04-29 NOTE — Telephone Encounter (Signed)
-----   Message from [facility] sent at 04/29/2023  9:17 AM EDT ----- Regarding: appt for surgery posting Hi [name] can you get Tim an appointment with Dr August Saucer to discuss shoulder surgery now that he has seen Dr Randie Heinz?  Thanks!

## 2023-04-30 NOTE — Telephone Encounter (Signed)
 Tried calling patient to schedule, went straight to VM

## 2023-05-01 DIAGNOSIS — E875 Hyperkalemia: Secondary | ICD-10-CM | POA: Diagnosis not present

## 2023-05-01 DIAGNOSIS — R519 Headache, unspecified: Secondary | ICD-10-CM | POA: Diagnosis not present

## 2023-05-01 DIAGNOSIS — N2581 Secondary hyperparathyroidism of renal origin: Secondary | ICD-10-CM | POA: Diagnosis not present

## 2023-05-01 DIAGNOSIS — Z992 Dependence on renal dialysis: Secondary | ICD-10-CM | POA: Diagnosis not present

## 2023-05-01 DIAGNOSIS — D689 Coagulation defect, unspecified: Secondary | ICD-10-CM | POA: Diagnosis not present

## 2023-05-01 DIAGNOSIS — N186 End stage renal disease: Secondary | ICD-10-CM | POA: Diagnosis not present

## 2023-05-03 DIAGNOSIS — Z992 Dependence on renal dialysis: Secondary | ICD-10-CM | POA: Diagnosis not present

## 2023-05-03 DIAGNOSIS — E875 Hyperkalemia: Secondary | ICD-10-CM | POA: Diagnosis not present

## 2023-05-03 DIAGNOSIS — N2581 Secondary hyperparathyroidism of renal origin: Secondary | ICD-10-CM | POA: Diagnosis not present

## 2023-05-03 DIAGNOSIS — N186 End stage renal disease: Secondary | ICD-10-CM | POA: Diagnosis not present

## 2023-05-03 DIAGNOSIS — D689 Coagulation defect, unspecified: Secondary | ICD-10-CM | POA: Diagnosis not present

## 2023-05-03 DIAGNOSIS — R519 Headache, unspecified: Secondary | ICD-10-CM | POA: Diagnosis not present

## 2023-05-06 ENCOUNTER — Encounter: Payer: Self-pay | Admitting: Podiatry

## 2023-05-06 ENCOUNTER — Ambulatory Visit (INDEPENDENT_AMBULATORY_CARE_PROVIDER_SITE_OTHER): Admitting: Podiatry

## 2023-05-06 DIAGNOSIS — D689 Coagulation defect, unspecified: Secondary | ICD-10-CM | POA: Diagnosis not present

## 2023-05-06 DIAGNOSIS — M79674 Pain in right toe(s): Secondary | ICD-10-CM

## 2023-05-06 DIAGNOSIS — M79675 Pain in left toe(s): Secondary | ICD-10-CM | POA: Diagnosis not present

## 2023-05-06 DIAGNOSIS — Z992 Dependence on renal dialysis: Secondary | ICD-10-CM | POA: Diagnosis not present

## 2023-05-06 DIAGNOSIS — N2581 Secondary hyperparathyroidism of renal origin: Secondary | ICD-10-CM | POA: Diagnosis not present

## 2023-05-06 DIAGNOSIS — B351 Tinea unguium: Secondary | ICD-10-CM

## 2023-05-06 DIAGNOSIS — R519 Headache, unspecified: Secondary | ICD-10-CM | POA: Diagnosis not present

## 2023-05-06 DIAGNOSIS — E875 Hyperkalemia: Secondary | ICD-10-CM | POA: Diagnosis not present

## 2023-05-06 DIAGNOSIS — N186 End stage renal disease: Secondary | ICD-10-CM | POA: Diagnosis not present

## 2023-05-06 NOTE — Progress Notes (Unsigned)
 Nails x 10.  [name] will be seeing him for diabetic shoe fitting.        Subjective:  Patient ID: Carl Bowens Sr., male    DOB: 1960/03/21,  MRN: [id number]  Carl Bowens Sr. presents to clinic today for:  Chief Complaint  Patient presents with   Diabetes    "My toenails and I have a pair of shoes somewhere."   Patient notes nails are thick, discolored, elongated and painful in shoegear when trying to ambulate.  He also is present for a diabetic shoe fitting today.  He will be seeing our pedorthist after his visit for his nail care today.  He got a 2nd pair of diabetic shoes ([employer] expense). Denies any significant changes in past medical history or any upcoming procedures.  PCP is Docia Chuck, [place], MD.  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    Parathyroid abnormality (HCC)    ectopic parathyroid gland   Presence of arteriovenous fistula for hemodialysis, primary (HCC)    RUE PER PT RLE   Refusal of blood product    NO WHOLE BLOOD PROUCTS   Renal cell carcinoma (HCC)    s/p hand assisted laparoscopic bilateral nephrectomies 11/29/17, + RCC left   Secondary hyperparathyroidism (HCC)    Seizures (HCC)    one episode in past, due to" elevated Potassium" 08/02/20- "at least 4 years ago"   Sleep apnea    doesn't use CPAP anymore since weight loss   Stroke [facility])    no residual    Past Surgical History:  Procedure Laterality Date   AV FISTULA PLACEMENT  Right    right arm   BIOPSY  08/01/2019   Procedure: BIOPSY;  Surgeon: Charna Elizabeth, MD;  Location: [facility] ENDOSCOPY;  Service: Endoscopy;;   BIOPSY  11/18/2020   Procedure: BIOPSY;  Surgeon: Jenel Lucks, MD;  Location: [facility] ENDOSCOPY;  Service: Gastroenterology;;   BIOPSY  09/13/2021   Procedure: BIOPSY;  Surgeon: Lemar Lofty., MD;  Location: [facility] ENDOSCOPY;  Service: Gastroenterology;;   CATARACT EXTRACTION W/ INTRAOCULAR LENS  IMPLANT, BILATERAL     COLON SURGERY     COLONOSCOPY N/A 08/04/2015   Procedure: COLONOSCOPY;  Surgeon: Beverley Fiedler, MD;  Location: [facility] ENDOSCOPY;  Service: Endoscopy;  Laterality: N/A;   COLONOSCOPY  2017   JMP@ [facility]-good prep-mass -recall 1 yr   COLONOSCOPY N/A 09/13/2021   Procedure: COLONOSCOPY;  Surgeon: Mansouraty, Netty Starring., MD;  Location: [facility] ENDOSCOPY;  Service: Gastroenterology;  Laterality: N/A;   COLONOSCOPY WITH PROPOFOL N/A 11/25/2020   Procedure: COLONOSCOPY WITH PROPOFOL;  Surgeon: Imogene Burn, MD;  Location: [facility] ENDOSCOPY;  Service: Gastroenterology;  Laterality: N/A;   COLONOSCOPY WITH PROPOFOL N/A 09/23/2021   Procedure: COLONOSCOPY WITH PROPOFOL;  Surgeon: Shellia Cleverly, DO;  Location:  [facility] ENDOSCOPY;  Service: Gastroenterology;  Laterality: N/A;   ENTEROSCOPY N/A 09/23/2021   Procedure: ENTEROSCOPY;  Surgeon: Shellia Cleverly, DO;  Location: [facility] ENDOSCOPY;  Service: Gastroenterology;  Laterality: N/A;  Will place order for video capsule study as we may opt to place it during procedure   ESOPHAGOGASTRODUODENOSCOPY N/A 08/01/2019   Procedure: ESOPHAGOGASTRODUODENOSCOPY (EGD);  Surgeon: Charna Elizabeth, MD;  Location: [facility] ENDOSCOPY;  Service: Endoscopy;  Laterality: N/A;   ESOPHAGOGASTRODUODENOSCOPY N/A 11/18/2020   Procedure: ESOPHAGOGASTRODUODENOSCOPY (EGD);  Surgeon: Jenel Lucks, MD;  Location: [facility] ENDOSCOPY;  Service: Gastroenterology;  Laterality: N/A;   ESOPHAGOGASTRODUODENOSCOPY (EGD) WITH PROPOFOL N/A 08/04/2019   Procedure:  ESOPHAGOGASTRODUODENOSCOPY (EGD) WITH PROPOFOL;  Surgeon: Benancio Deeds, MD;  Location: [facility] ENDOSCOPY;  Service: Gastroenterology;  Laterality: N/A;   ESOPHAGOGASTRODUODENOSCOPY (EGD) WITH PROPOFOL N/A 09/13/2021   Procedure: ESOPHAGOGASTRODUODENOSCOPY (EGD) WITH PROPOFOL;  Surgeon: Meridee Score Netty Starring., MD;  Location: [facility] ENDOSCOPY;  Service: Gastroenterology;  Laterality: N/A;   GIVENS CAPSULE STUDY N/A 09/23/2021   Procedure: GIVENS CAPSULE STUDY;  Surgeon: Shellia Cleverly, DO;  Location: [facility] ENDOSCOPY;  Service: Gastroenterology;  Laterality: N/A;   graft left arm Left    for dialysis x 2. Removed   HOT HEMOSTASIS  11/18/2020   Procedure: HOT HEMOSTASIS (ARGON PLASMA COAGULATION/BICAP);  Surgeon: Jenel Lucks, MD;  Location: [facility] ENDOSCOPY;  Service: Gastroenterology;;   HOT HEMOSTASIS N/A 09/23/2021   Procedure: HOT HEMOSTASIS (ARGON PLASMA COAGULATION/BICAP);  Surgeon: Shellia Cleverly, DO;  Location: [facility] ENDOSCOPY;  Service: Gastroenterology;  Laterality: N/A;   INSERTION OF DIALYSIS CATHETER     Rt chest   LAPAROSCOPIC RIGHT COLECTOMY N/A 08/05/2015   Procedure: LAPAROSCOPIC RIGHT COLECTOMY- ASCENDING;  Surgeon: Almond Lint, MD;  Location: [facility] OR;  Service: General;  Laterality: N/A;   MASS EXCISION Left 05/28/2019   Procedure: EXCISION SOFT TISSUE MASS LEFT SHOULDER;  Surgeon: Darnell Level, MD;  Location: [facility] ORS;  Service: General;  Laterality: Left;   NEPHRECTOMY Bilateral    PARATHYROIDECTOMY N/A 06/12/2016   Procedure: TOTAL PARATHYROIDECTOMY WITH AUTOTRANSPLANTATION TO LEFT FOREARM;  Surgeon: Darnell Level, MD;  Location: [facility] OR;  Service: General;  Laterality: N/A;   PARATHYROIDECTOMY N/A 10/23/2016   Procedure: PARATHYROIDECTOMY;  Surgeon: Darnell Level, MD;  Location: [facility] OR;  Service: General;  Laterality: N/A;   REVERSE SHOULDER ARTHROPLASTY Right 08/24/2020   Procedure: REVERSE SHOULDER ARTHROPLASTY;  Surgeon: Bjorn Pippin, MD;  Location: [facility] OR;  Service: Orthopedics;   Laterality: Right;   REVISON OF ARTERIOVENOUS FISTULA Right 07/16/2017   Procedure: REVISION OF ARTERIOVENOUS FISTULA  Right ARM;  Surgeon: Maeola Harman, MD;  Location: [facility] OR;  Service: Vascular;  Laterality: Right;   TOTAL HIP ARTHROPLASTY Left 11/14/2020   Procedure: LEFT TOTAL HIP ARTHROPLASTY ANTERIOR APPROACH;  Surgeon: Tarry Kos, MD;  Location: [facility] OR;  Service: Orthopedics;  Laterality: Left;   UPPER GASTROINTESTINAL ENDOSCOPY  2021   @ [facility]    Allergies  Allergen Reactions   Infed [Iron Dextran] Other (See Comments)    Decreased BP    Oxycodone Nausea Only   Vicodin [Hydrocodone-Acetaminophen] Other (See Comments)    Decreased BP   Diclofenac Other (See Comments)    Unknown    Review of Systems: Negative except as noted in the HPI.  Objective:  Carl Jawad Sr. is a pleasant 63 y.o. male in NAD. AAO x 3.  Vascular Examination: Capillary refill time is 3-5 seconds to toes bilateral. Palpable pedal pulses b/l LE. Digital hair present b/l.  Skin temperature gradient WNL b/l. No varicosities b/l. No cyanosis noted b/l.   Dermatological Examination: Pedal skin with normal turgor, texture and tone b/l. No open wounds. No interdigital macerations b/l. Toenails x10 are 3mm thick, discolored, dystrophic with subungual debris. There is pain with compression of the nail plates.  They are elongated x10  Assessment/Plan: 1. Pain due to onychomycosis of toenails of both feet    The mycotic toenails were sharply debrided x10 with sterile nail nippers and a power debriding burr to decrease bulk/thickness and length.    Following the patient's visit, he was walked over to the pedorthist lab and proceeded with his diabetic shoe fitting.  Follow-up in 3 months for diabetic footcare.  Clerance Lav, DPM, FACFAS [facility]     [address] [place], [place] [address]                Office [phone]  Fax  [phone]

## 2023-05-08 ENCOUNTER — Ambulatory Visit (INDEPENDENT_AMBULATORY_CARE_PROVIDER_SITE_OTHER): Admitting: Orthopedic Surgery

## 2023-05-08 DIAGNOSIS — T84038D Mechanical loosening of other internal prosthetic joint, subsequent encounter: Secondary | ICD-10-CM

## 2023-05-08 DIAGNOSIS — Z992 Dependence on renal dialysis: Secondary | ICD-10-CM | POA: Diagnosis not present

## 2023-05-08 DIAGNOSIS — Z96619 Presence of unspecified artificial shoulder joint: Secondary | ICD-10-CM

## 2023-05-08 DIAGNOSIS — E875 Hyperkalemia: Secondary | ICD-10-CM | POA: Diagnosis not present

## 2023-05-08 DIAGNOSIS — N186 End stage renal disease: Secondary | ICD-10-CM | POA: Diagnosis not present

## 2023-05-08 DIAGNOSIS — N2581 Secondary hyperparathyroidism of renal origin: Secondary | ICD-10-CM | POA: Diagnosis not present

## 2023-05-08 DIAGNOSIS — D689 Coagulation defect, unspecified: Secondary | ICD-10-CM | POA: Diagnosis not present

## 2023-05-08 DIAGNOSIS — R519 Headache, unspecified: Secondary | ICD-10-CM | POA: Diagnosis not present

## 2023-05-08 MED ORDER — ACETAMINOPHEN-CODEINE 300-30 MG PO TABS: 1.0000 | ORAL_TABLET | Freq: Four times a day (QID) | ORAL | 0 refills | Status: DC | PRN

## 2023-05-09 DIAGNOSIS — M25511 Pain in right shoulder: Secondary | ICD-10-CM | POA: Diagnosis not present

## 2023-05-10 DIAGNOSIS — N186 End stage renal disease: Secondary | ICD-10-CM | POA: Diagnosis not present

## 2023-05-10 DIAGNOSIS — E875 Hyperkalemia: Secondary | ICD-10-CM | POA: Diagnosis not present

## 2023-05-10 DIAGNOSIS — Z992 Dependence on renal dialysis: Secondary | ICD-10-CM | POA: Diagnosis not present

## 2023-05-10 DIAGNOSIS — N2581 Secondary hyperparathyroidism of renal origin: Secondary | ICD-10-CM | POA: Diagnosis not present

## 2023-05-10 DIAGNOSIS — D689 Coagulation defect, unspecified: Secondary | ICD-10-CM | POA: Diagnosis not present

## 2023-05-10 DIAGNOSIS — R519 Headache, unspecified: Secondary | ICD-10-CM | POA: Diagnosis not present

## 2023-05-11 ENCOUNTER — Encounter: Payer: Self-pay | Admitting: Orthopedic Surgery

## 2023-05-11 NOTE — Progress Notes (Signed)
 Office Visit Note   Patient: Carl Ghosh Sr.           Date of Birth: Sep 06, 1960           MRN: [id number] Visit Date: 05/08/2023 Requested by: Carl Bussing, MD [address] [place],  [place] [address] PCP: Carl Bussing, MD  Subjective: Chief Complaint  Patient presents with   Right Shoulder - Pain, Follow-up    HPI: Carl Hunley Sr. is a 63 y.o. male who presents to the office reporting significant right shoulder pain.  He underwent right reverse shoulder replacement relatively recently.  The humeral component has subsided significantly.  Glenoid component slightly high in the glenoid with slight superior tilt.  No clear-cut evidence of loosening but there is impending fracture around the stem of the humeral component.  The patient has a fistula in that right arm and has seen vascular surgeon who has studied it and the fistula connects with deeper veins than the superficial veins which could be at risk from the shoulder surgery.              ROS: All systems reviewed are negative as they relate to the chief complaint within the history of present illness.  Patient denies fevers or chills.  Assessment & Plan: Visit Diagnoses: No diagnosis found.  Plan: Impression is failed right reverse shoulder replacement.  Subsidence of the humeral component with some mild malposition of the right glenoid component.  I think is possible the humeral component was undersized to the degree that significant subsidence has occurred.  The other possibility is infection.  Humeral component not necessarily loose but may also need revision as this particular system does not have a great option for making the glenoid more inferior with the existing baseplate to the degree that would be necessary.  The risk and benefits of revision surgery are discussed with [name] including not limited to infection nerve vessel damage failure of the fistula as well as the potential that if this is  infected we would have to put in an antibiotic spacer and put him on antibiotics for at least 6 weeks.  Also possibility that glenoid revision may not be possible in this instance.  Patient understands the risk and benefits as well as the extensive rehabilitative process involved.  All questions answered.  Follow-Up Instructions: No follow-ups on file.   Orders:  No orders of the defined types were placed in this encounter.  Meds ordered this encounter  Medications   acetaminophen-codeine (TYLENOL #3) 300-30 MG tablet    Sig: Take 1 tablet by mouth every 6 (six) hours as needed for moderate pain (pain score 4-6).    Dispense:  30 tablet    Refill:  0      Procedures: No procedures performed   Clinical Data: No additional findings.  Objective: Vital Signs: There were no vitals taken for this visit.  Physical Exam:  Constitutional: Patient appears well-developed HEENT:  Head: Normocephalic Eyes:EOM are normal Neck: Normal range of motion Cardiovascular: Normal rate Pulmonary/chest: Effort normal Neurologic: Patient is alert Skin: Skin is warm Psychiatric: Patient has normal mood and affect  Ortho Exam: Ortho exam demonstrates functional deltoid on the right.  Has reasonable passive range of motion but it is mildly painful.  No warmth or lymphadenopathy around the right shoulder girdle region.  Cervical spine range of motion intact.  Remainder of the shoulder exam is unchanged from prior clinical visit.  Specialty Comments:  No specialty comments available.  Imaging: No results found.   PMFS History: Patient Active Problem List   Diagnosis Date Noted   Duodenitis    Gastritis and gastroduodenitis    Ischemic colitis (HCC)    Bright red blood per rectum    ABLA (acute blood loss anemia)    Gastric ulcer    Duodenal ulcer    Diverticulosis of colon without hemorrhage    GI bleed 09/17/2021   Acute upper GI bleeding 09/11/2021   Mucosal abnormality of esophagus     Mucosal abnormality of stomach    Mucosal abnormality of duodenum    Upper GI bleed    Left hip pain 11/16/2020   Type 2 diabetes mellitus with other specified complication (HCC) 11/14/2020   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/14/2020   Primary osteoarthritis of left hip 10/07/2020   Status post reverse total arthroplasty of right shoulder 08/24/2020   Diverticulitis 03/08/2020   ESRD (end stage renal disease) (HCC) 03/08/2020   Polyneuropathy in diseases classified elsewhere (HCC) 03/08/2020   Pure hypercholesterolemia 03/08/2020   Sacral back pain 03/08/2020   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 11/10/2019   Acute gastric ulcer with hemorrhage    Acute GI bleeding 07/31/2019   Hypotension of hemodialysis 07/31/2019   Diverticulitis large intestine 07/31/2019   Mass of soft tissue of shoulder 05/26/2019   Awaiting organ transplant status 03/18/2019   Finding of cocaine in blood 02/25/2019   Anaphylactic shock, unspecified, sequela 11/21/2018   Hypercalcemia 10/28/2018   Diarrhea, unspecified 08/08/2018   Hyperkalemia 03/12/2018   Renal cell carcinoma of left kidney (HCC) 12/24/2017   Fluid overload, unspecified 07/06/2017   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   Other specified disorders of kidney and ureter 11/22/2016   Secondary hyperparathyroidism of renal origin (HCC) 10/23/2016   Headache disorder 08/21/2016   TIA (transient ischemic attack)    Essential hypertension    Seizures (HCC)    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    Lung nodule    Diverticulitis of cecum 07/21/2015   Diverticulitis of large intestine without perforation or abscess without bleeding    Right lower quadrant pain    Obstructive sleep apnea 02/08/2015   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   Diverticulosis 07/13/2014   Lower GI bleed 07/12/2014   Refusal of blood transfusions as patient is Jehovah's Witness    Parotid mass    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   HLD (hyperlipidemia)    PRES (posterior reversible encephalopathy syndrome)    History of lacunar cerebrovascular accident (CVA) 05/23/2014   Anemia 05/23/2014   GERD (gastroesophageal reflux disease) 12/09/2012   Gastrointestinal bleeding 11/12/2012   Melena 11/12/2012   Other psychoactive substance dependence, uncomplicated (HCC) 10/08/2012   Iron deficiency anemia, unspecified 06/10/2012   Moderate protein-calorie malnutrition (HCC) 07/29/2009   Coagulation defect, unspecified (HCC) 08/29/2006   Type 2 diabetes mellitus with diabetic peripheral angiopathy without gangrene (HCC) 08/29/2006   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 stage renal disease (HCC) 08/02/2006   Past Medical History:  Diagnosis Date   Acute gastric ulcer with hemorrhage    Acute GI bleeding 07/31/2019   Acute pericarditis    Anemia    hx of   Anxiety    situational    Arthritis    on meds   Borderline diabetes    Cataract    bilateral sx   Chronic headaches    Depression  situational    Diverticulitis    ESRD (end stage renal disease) (HCC)     On Renal Transplant List," [facility]; MWF" (10/23/2016)   GERD (gastroesophageal reflux disease)    with certain foods   GI bleed    Hypertension    diet controlled   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    Parathyroid abnormality (HCC)    ectopic parathyroid gland   Presence of arteriovenous fistula for hemodialysis, primary (HCC)    RUE PER PT RLE   Refusal of blood product    NO WHOLE BLOOD PROUCTS   Renal cell carcinoma (HCC)    s/p hand assisted laparoscopic bilateral nephrectomies 11/29/17, + RCC left   Secondary hyperparathyroidism (HCC)    Seizures (HCC)    one episode in past, due to" elevated Potassium" 08/02/20- "at least 4 years ago"   Sleep apnea    doesn't use CPAP anymore since weight loss   Stroke (HCC)    no residual    Family History   Problem Relation Age of Onset   Diabetes Father    Stroke Father    Hypertension Father    Uterine cancer Mother    Lupus Sister    Stroke Sister    Hypertension Sister    Anuerysm Brother        brain   Colon cancer Neg Hx    Esophageal cancer Neg Hx    Stomach cancer Neg Hx    Pancreatic cancer Neg Hx    Liver disease Neg Hx    Colon polyps Neg Hx    Rectal cancer Neg Hx     Past Surgical History:  Procedure Laterality Date   AV FISTULA PLACEMENT Right    right arm   BIOPSY  08/01/2019   Procedure: BIOPSY;  Surgeon: Charna Elizabeth, MD;  Location: [facility] ENDOSCOPY;  Service: Endoscopy;;   BIOPSY  11/18/2020   Procedure: BIOPSY;  Surgeon: Jenel Lucks, MD;  Location: [facility] ENDOSCOPY;  Service: Gastroenterology;;   BIOPSY  09/13/2021   Procedure: BIOPSY;  Surgeon: Lemar Lofty., MD;  Location: [facility] ENDOSCOPY;  Service: Gastroenterology;;   CATARACT EXTRACTION W/ INTRAOCULAR LENS  IMPLANT, BILATERAL     COLON SURGERY     COLONOSCOPY N/A 08/04/2015   Procedure: COLONOSCOPY;  Surgeon: Beverley Fiedler, MD;  Location: [facility] ENDOSCOPY;  Service: Endoscopy;  Laterality: N/A;   COLONOSCOPY  2017   JMP@ [facility]-good prep-mass -recall 1 yr   COLONOSCOPY N/A 09/13/2021   Procedure: COLONOSCOPY;  Surgeon: Mansouraty, Netty Starring., MD;  Location: [facility] ENDOSCOPY;  Service: Gastroenterology;  Laterality: N/A;   COLONOSCOPY WITH PROPOFOL N/A 11/25/2020   Procedure: COLONOSCOPY WITH PROPOFOL;  Surgeon: Imogene Burn, MD;  Location: [facility] ENDOSCOPY;  Service: Gastroenterology;  Laterality: N/A;   COLONOSCOPY WITH PROPOFOL N/A 09/23/2021   Procedure: COLONOSCOPY WITH PROPOFOL;  Surgeon: Shellia Cleverly, DO;  Location: [facility] ENDOSCOPY;  Service: Gastroenterology;  Laterality: N/A;   ENTEROSCOPY N/A 09/23/2021   Procedure: ENTEROSCOPY;  Surgeon: Shellia Cleverly, DO;  Location: [facility] ENDOSCOPY;  Service: Gastroenterology;  Laterality: N/A;  Will place order for video capsule study as we may opt to place it  during procedure   ESOPHAGOGASTRODUODENOSCOPY N/A 08/01/2019   Procedure: ESOPHAGOGASTRODUODENOSCOPY (EGD);  Surgeon: Charna Elizabeth, MD;  Location: [facility] ENDOSCOPY;  Service: Endoscopy;  Laterality: N/A;   ESOPHAGOGASTRODUODENOSCOPY N/A 11/18/2020   Procedure: ESOPHAGOGASTRODUODENOSCOPY (EGD);  Surgeon: Jenel Lucks, MD;  Location: [facility] ENDOSCOPY;  Service: Gastroenterology;  Laterality: N/A;   ESOPHAGOGASTRODUODENOSCOPY (EGD)  WITH PROPOFOL N/A 08/04/2019   Procedure: ESOPHAGOGASTRODUODENOSCOPY (EGD) WITH PROPOFOL;  Surgeon: Benancio Deeds, MD;  Location: [facility] ENDOSCOPY;  Service: Gastroenterology;  Laterality: N/A;   ESOPHAGOGASTRODUODENOSCOPY (EGD) WITH PROPOFOL N/A 09/13/2021   Procedure: ESOPHAGOGASTRODUODENOSCOPY (EGD) WITH PROPOFOL;  Surgeon: Meridee Score Netty Starring., MD;  Location: [facility] ENDOSCOPY;  Service: Gastroenterology;  Laterality: N/A;   GIVENS CAPSULE STUDY N/A 09/23/2021   Procedure: GIVENS CAPSULE STUDY;  Surgeon: Shellia Cleverly, DO;  Location: [facility] ENDOSCOPY;  Service: Gastroenterology;  Laterality: N/A;   graft left arm Left    for dialysis x 2. Removed   HOT HEMOSTASIS  11/18/2020   Procedure: HOT HEMOSTASIS (ARGON PLASMA COAGULATION/BICAP);  Surgeon: Jenel Lucks, MD;  Location: [facility] ENDOSCOPY;  Service: Gastroenterology;;   HOT HEMOSTASIS N/A 09/23/2021   Procedure: HOT HEMOSTASIS (ARGON PLASMA COAGULATION/BICAP);  Surgeon: Shellia Cleverly, DO;  Location: [facility] ENDOSCOPY;  Service: Gastroenterology;  Laterality: N/A;   INSERTION OF DIALYSIS CATHETER     Rt chest   LAPAROSCOPIC RIGHT COLECTOMY N/A 08/05/2015   Procedure: LAPAROSCOPIC RIGHT COLECTOMY- ASCENDING;  Surgeon: Almond Lint, MD;  Location: [facility] OR;  Service: General;  Laterality: N/A;   MASS EXCISION Left 05/28/2019   Procedure: EXCISION SOFT TISSUE MASS LEFT SHOULDER;  Surgeon: Darnell Level, MD;  Location: [facility] ORS;  Service: General;  Laterality: Left;   NEPHRECTOMY Bilateral    PARATHYROIDECTOMY N/A 06/12/2016    Procedure: TOTAL PARATHYROIDECTOMY WITH AUTOTRANSPLANTATION TO LEFT FOREARM;  Surgeon: Darnell Level, MD;  Location: [facility] OR;  Service: General;  Laterality: N/A;   PARATHYROIDECTOMY N/A 10/23/2016   Procedure: PARATHYROIDECTOMY;  Surgeon: Darnell Level, MD;  Location: [facility] OR;  Service: General;  Laterality: N/A;   REVERSE SHOULDER ARTHROPLASTY Right 08/24/2020   Procedure: REVERSE SHOULDER ARTHROPLASTY;  Surgeon: Bjorn Pippin, MD;  Location: [facility] OR;  Service: Orthopedics;  Laterality: Right;   REVISON OF ARTERIOVENOUS FISTULA Right 07/16/2017   Procedure: REVISION OF ARTERIOVENOUS FISTULA  Right ARM;  Surgeon: Maeola Harman, MD;  Location[facility] OR;  Service: Vascular;  Laterality: Right;   TOTAL HIP ARTHROPLASTY Left 11/14/2020   Procedure: LEFT TOTAL HIP ARTHROPLASTY ANTERIOR APPROACH;  Surgeon: Tarry Kos, MD;  Location: [facility] OR;  Service: Orthopedics;  Laterality: Left;   UPPER GASTROINTESTINAL ENDOSCOPY  2021   @ [facility]   Social History   Occupational History   Occupation: retired    Associate Professor: SELF EMPLOYED  Tobacco Use   Smoking status: Former    Current packs/day: 0.00    Types: Cigarettes    Quit date: 06/19/1990    Years since quitting: 32.9   Smokeless tobacco: Never   Tobacco comments:    rarely cigar  Vaping Use   Vaping status: Never Used  Substance and Sexual Activity   Alcohol use: Not Currently    Alcohol/week: 1.0 standard drink of alcohol    Types: 1 Cans of beer per week    Comment: occasional beer   Drug use: Not Currently    Types: Marijuana    Comment: Last use several yrs ago   Sexual activity: Not Currently

## 2023-05-13 DIAGNOSIS — N2581 Secondary hyperparathyroidism of renal origin: Secondary | ICD-10-CM | POA: Diagnosis not present

## 2023-05-13 DIAGNOSIS — I12 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 stage renal disease: Secondary | ICD-10-CM | POA: Diagnosis not present

## 2023-05-13 DIAGNOSIS — D689 Coagulation defect, unspecified: Secondary | ICD-10-CM | POA: Diagnosis not present

## 2023-05-13 DIAGNOSIS — Z992 Dependence on renal dialysis: Secondary | ICD-10-CM | POA: Diagnosis not present

## 2023-05-13 DIAGNOSIS — H6121 Impacted cerumen, right ear: Secondary | ICD-10-CM | POA: Diagnosis not present

## 2023-05-13 DIAGNOSIS — R519 Headache, unspecified: Secondary | ICD-10-CM | POA: Diagnosis not present

## 2023-05-13 DIAGNOSIS — E875 Hyperkalemia: Secondary | ICD-10-CM | POA: Diagnosis not present

## 2023-05-13 DIAGNOSIS — N186 End stage renal disease: Secondary | ICD-10-CM | POA: Diagnosis not present

## 2023-05-15 DIAGNOSIS — E1129 Type 2 diabetes mellitus with other diabetic kidney complication: Secondary | ICD-10-CM | POA: Diagnosis not present

## 2023-05-15 DIAGNOSIS — R519 Headache, unspecified: Secondary | ICD-10-CM | POA: Diagnosis not present

## 2023-05-15 DIAGNOSIS — N2581 Secondary hyperparathyroidism of renal origin: Secondary | ICD-10-CM | POA: Diagnosis not present

## 2023-05-15 DIAGNOSIS — R197 Diarrhea, unspecified: Secondary | ICD-10-CM | POA: Diagnosis not present

## 2023-05-15 DIAGNOSIS — D689 Coagulation defect, unspecified: Secondary | ICD-10-CM | POA: Diagnosis not present

## 2023-05-15 DIAGNOSIS — N186 End stage renal disease: Secondary | ICD-10-CM | POA: Diagnosis not present

## 2023-05-15 DIAGNOSIS — Z992 Dependence on renal dialysis: Secondary | ICD-10-CM | POA: Diagnosis not present

## 2023-05-15 DIAGNOSIS — E875 Hyperkalemia: Secondary | ICD-10-CM | POA: Diagnosis not present

## 2023-05-16 DIAGNOSIS — M25511 Pain in right shoulder: Secondary | ICD-10-CM | POA: Diagnosis not present

## 2023-05-16 DIAGNOSIS — H6123 Impacted cerumen, bilateral: Secondary | ICD-10-CM | POA: Diagnosis not present

## 2023-05-17 DIAGNOSIS — Z992 Dependence on renal dialysis: Secondary | ICD-10-CM | POA: Diagnosis not present

## 2023-05-17 DIAGNOSIS — D689 Coagulation defect, unspecified: Secondary | ICD-10-CM | POA: Diagnosis not present

## 2023-05-17 DIAGNOSIS — E1129 Type 2 diabetes mellitus with other diabetic kidney complication: Secondary | ICD-10-CM | POA: Diagnosis not present

## 2023-05-17 DIAGNOSIS — R197 Diarrhea, unspecified: Secondary | ICD-10-CM | POA: Diagnosis not present

## 2023-05-17 DIAGNOSIS — E875 Hyperkalemia: Secondary | ICD-10-CM | POA: Diagnosis not present

## 2023-05-17 DIAGNOSIS — R519 Headache, unspecified: Secondary | ICD-10-CM | POA: Diagnosis not present

## 2023-05-17 DIAGNOSIS — N186 End stage renal disease: Secondary | ICD-10-CM | POA: Diagnosis not present

## 2023-05-17 DIAGNOSIS — N2581 Secondary hyperparathyroidism of renal origin: Secondary | ICD-10-CM | POA: Diagnosis not present

## 2023-05-20 DIAGNOSIS — R519 Headache, unspecified: Secondary | ICD-10-CM | POA: Diagnosis not present

## 2023-05-20 DIAGNOSIS — N186 End stage renal disease: Secondary | ICD-10-CM | POA: Diagnosis not present

## 2023-05-20 DIAGNOSIS — D689 Coagulation defect, unspecified: Secondary | ICD-10-CM | POA: Diagnosis not present

## 2023-05-20 DIAGNOSIS — N2581 Secondary hyperparathyroidism of renal origin: Secondary | ICD-10-CM | POA: Diagnosis not present

## 2023-05-20 DIAGNOSIS — Z992 Dependence on renal dialysis: Secondary | ICD-10-CM | POA: Diagnosis not present

## 2023-05-20 DIAGNOSIS — R197 Diarrhea, unspecified: Secondary | ICD-10-CM | POA: Diagnosis not present

## 2023-05-20 DIAGNOSIS — E1129 Type 2 diabetes mellitus with other diabetic kidney complication: Secondary | ICD-10-CM | POA: Diagnosis not present

## 2023-05-20 DIAGNOSIS — E875 Hyperkalemia: Secondary | ICD-10-CM | POA: Diagnosis not present

## 2023-05-22 DIAGNOSIS — G63 Polyneuropathy in diseases classified elsewhere: Secondary | ICD-10-CM | POA: Diagnosis not present

## 2023-05-22 DIAGNOSIS — R197 Diarrhea, unspecified: Secondary | ICD-10-CM | POA: Diagnosis not present

## 2023-05-22 DIAGNOSIS — D689 Coagulation defect, unspecified: Secondary | ICD-10-CM | POA: Diagnosis not present

## 2023-05-22 DIAGNOSIS — R519 Headache, unspecified: Secondary | ICD-10-CM | POA: Diagnosis not present

## 2023-05-22 DIAGNOSIS — N186 End stage renal disease: Secondary | ICD-10-CM | POA: Diagnosis not present

## 2023-05-22 DIAGNOSIS — Z992 Dependence on renal dialysis: Secondary | ICD-10-CM | POA: Diagnosis not present

## 2023-05-22 DIAGNOSIS — N2581 Secondary hyperparathyroidism of renal origin: Secondary | ICD-10-CM | POA: Diagnosis not present

## 2023-05-22 DIAGNOSIS — E875 Hyperkalemia: Secondary | ICD-10-CM | POA: Diagnosis not present

## 2023-05-22 DIAGNOSIS — E1129 Type 2 diabetes mellitus with other diabetic kidney complication: Secondary | ICD-10-CM | POA: Diagnosis not present

## 2023-05-23 DIAGNOSIS — M25511 Pain in right shoulder: Secondary | ICD-10-CM | POA: Diagnosis not present

## 2023-05-24 DIAGNOSIS — N186 End stage renal disease: Secondary | ICD-10-CM | POA: Diagnosis not present

## 2023-05-24 DIAGNOSIS — N2581 Secondary hyperparathyroidism of renal origin: Secondary | ICD-10-CM | POA: Diagnosis not present

## 2023-05-24 DIAGNOSIS — D689 Coagulation defect, unspecified: Secondary | ICD-10-CM | POA: Diagnosis not present

## 2023-05-24 DIAGNOSIS — R519 Headache, unspecified: Secondary | ICD-10-CM | POA: Diagnosis not present

## 2023-05-24 DIAGNOSIS — R197 Diarrhea, unspecified: Secondary | ICD-10-CM | POA: Diagnosis not present

## 2023-05-24 DIAGNOSIS — Z992 Dependence on renal dialysis: Secondary | ICD-10-CM | POA: Diagnosis not present

## 2023-05-24 DIAGNOSIS — E1129 Type 2 diabetes mellitus with other diabetic kidney complication: Secondary | ICD-10-CM | POA: Diagnosis not present

## 2023-05-24 DIAGNOSIS — E875 Hyperkalemia: Secondary | ICD-10-CM | POA: Diagnosis not present

## 2023-05-25 ENCOUNTER — Emergency Department (HOSPITAL_COMMUNITY)

## 2023-05-25 ENCOUNTER — Encounter (HOSPITAL_COMMUNITY)
Admission: EM | Disposition: A | Discharge status: Rehabilitation Facility | Payer: Self-pay | Source: Home / Self Care | Attending: Family Medicine

## 2023-05-25 ENCOUNTER — Inpatient Hospital Stay (HOSPITAL_COMMUNITY)
Admission: EM | Admit: 2023-05-25 | Discharge: 2023-05-30 | DRG: 028 | Disposition: A | Discharge status: Rehabilitation Facility | Attending: Internal Medicine | Admitting: Internal Medicine

## 2023-05-25 ENCOUNTER — Encounter (HOSPITAL_COMMUNITY): Payer: Self-pay

## 2023-05-25 DIAGNOSIS — I953 Hypotension of hemodialysis: Secondary | ICD-10-CM | POA: Diagnosis not present

## 2023-05-25 DIAGNOSIS — Z8249 Family history of ischemic heart disease and other diseases of the circulatory system: Secondary | ICD-10-CM | POA: Diagnosis not present

## 2023-05-25 DIAGNOSIS — Z833 Family history of diabetes mellitus: Secondary | ICD-10-CM

## 2023-05-25 DIAGNOSIS — Z992 Dependence on renal dialysis: Secondary | ICD-10-CM

## 2023-05-25 DIAGNOSIS — N2581 Secondary hyperparathyroidism of renal origin: Secondary | ICD-10-CM | POA: Diagnosis not present

## 2023-05-25 DIAGNOSIS — F419 Anxiety disorder, unspecified: Secondary | ICD-10-CM | POA: Diagnosis present

## 2023-05-25 DIAGNOSIS — S12300A Unspecified displaced fracture of fourth cervical vertebra, initial encounter for closed fracture: Secondary | ICD-10-CM | POA: Diagnosis present

## 2023-05-25 DIAGNOSIS — N186 End stage renal disease: Secondary | ICD-10-CM | POA: Diagnosis not present

## 2023-05-25 DIAGNOSIS — K592 Neurogenic bowel, not elsewhere classified: Secondary | ICD-10-CM | POA: Diagnosis not present

## 2023-05-25 DIAGNOSIS — Z8719 Personal history of other diseases of the digestive system: Secondary | ICD-10-CM

## 2023-05-25 DIAGNOSIS — W19XXXA Unspecified fall, initial encounter: Principal | ICD-10-CM

## 2023-05-25 DIAGNOSIS — Z87891 Personal history of nicotine dependence: Secondary | ICD-10-CM | POA: Diagnosis not present

## 2023-05-25 DIAGNOSIS — Z85528 Personal history of other malignant neoplasm of kidney: Secondary | ICD-10-CM | POA: Diagnosis not present

## 2023-05-25 DIAGNOSIS — S12391A Other nondisplaced fracture of fourth cervical vertebra, initial encounter for closed fracture: Secondary | ICD-10-CM | POA: Diagnosis not present

## 2023-05-25 DIAGNOSIS — F54 Psychological and behavioral factors associated with disorders or diseases classified elsewhere: Secondary | ICD-10-CM | POA: Diagnosis not present

## 2023-05-25 DIAGNOSIS — M7989 Other specified soft tissue disorders: Secondary | ICD-10-CM | POA: Diagnosis not present

## 2023-05-25 DIAGNOSIS — E875 Hyperkalemia: Secondary | ICD-10-CM | POA: Diagnosis not present

## 2023-05-25 DIAGNOSIS — Z905 Acquired absence of kidney: Secondary | ICD-10-CM | POA: Diagnosis not present

## 2023-05-25 DIAGNOSIS — S12301A Unspecified nondisplaced fracture of fourth cervical vertebra, initial encounter for closed fracture: Secondary | ICD-10-CM

## 2023-05-25 DIAGNOSIS — Z8269 Family history of other diseases of the musculoskeletal system and connective tissue: Secondary | ICD-10-CM

## 2023-05-25 DIAGNOSIS — E1122 Type 2 diabetes mellitus with diabetic chronic kidney disease: Secondary | ICD-10-CM | POA: Diagnosis not present

## 2023-05-25 DIAGNOSIS — S12390A Other displaced fracture of fourth cervical vertebra, initial encounter for closed fracture: Secondary | ICD-10-CM | POA: Diagnosis not present

## 2023-05-25 DIAGNOSIS — Z4682 Encounter for fitting and adjustment of non-vascular catheter: Secondary | ICD-10-CM | POA: Diagnosis not present

## 2023-05-25 DIAGNOSIS — S14104A Unspecified injury at C4 level of cervical spinal cord, initial encounter: Secondary | ICD-10-CM | POA: Diagnosis not present

## 2023-05-25 DIAGNOSIS — G9520 Unspecified cord compression: Secondary | ICD-10-CM

## 2023-05-25 DIAGNOSIS — Y9301 Activity, walking, marching and hiking: Secondary | ICD-10-CM | POA: Diagnosis present

## 2023-05-25 DIAGNOSIS — D638 Anemia in other chronic diseases classified elsewhere: Secondary | ICD-10-CM | POA: Diagnosis present

## 2023-05-25 DIAGNOSIS — E8889 Other specified metabolic disorders: Secondary | ICD-10-CM | POA: Diagnosis not present

## 2023-05-25 DIAGNOSIS — G4733 Obstructive sleep apnea (adult) (pediatric): Secondary | ICD-10-CM | POA: Diagnosis not present

## 2023-05-25 DIAGNOSIS — S14154S Other incomplete lesion at C4 level of cervical spinal cord, sequela: Secondary | ICD-10-CM | POA: Diagnosis not present

## 2023-05-25 DIAGNOSIS — Z01818 Encounter for other preprocedural examination: Secondary | ICD-10-CM

## 2023-05-25 DIAGNOSIS — I6782 Cerebral ischemia: Secondary | ICD-10-CM | POA: Diagnosis not present

## 2023-05-25 DIAGNOSIS — R221 Localized swelling, mass and lump, neck: Secondary | ICD-10-CM | POA: Diagnosis not present

## 2023-05-25 DIAGNOSIS — W1830XA Fall on same level, unspecified, initial encounter: Secondary | ICD-10-CM | POA: Diagnosis present

## 2023-05-25 DIAGNOSIS — R2689 Other abnormalities of gait and mobility: Secondary | ICD-10-CM | POA: Diagnosis not present

## 2023-05-25 DIAGNOSIS — Z8711 Personal history of peptic ulcer disease: Secondary | ICD-10-CM

## 2023-05-25 DIAGNOSIS — S129XXA Fracture of neck, unspecified, initial encounter: Secondary | ICD-10-CM

## 2023-05-25 DIAGNOSIS — R29890 Loss of height: Secondary | ICD-10-CM | POA: Diagnosis not present

## 2023-05-25 DIAGNOSIS — M25561 Pain in right knee: Secondary | ICD-10-CM | POA: Diagnosis not present

## 2023-05-25 DIAGNOSIS — I10 Essential (primary) hypertension: Secondary | ICD-10-CM | POA: Diagnosis not present

## 2023-05-25 DIAGNOSIS — Z9841 Cataract extraction status, right eye: Secondary | ICD-10-CM

## 2023-05-25 DIAGNOSIS — M25461 Effusion, right knee: Secondary | ICD-10-CM | POA: Diagnosis not present

## 2023-05-25 DIAGNOSIS — S14154A Other incomplete lesion at C4 level of cervical spinal cord, initial encounter: Principal | ICD-10-CM | POA: Diagnosis present

## 2023-05-25 DIAGNOSIS — R569 Unspecified convulsions: Secondary | ICD-10-CM

## 2023-05-25 DIAGNOSIS — W1839XS Other fall on same level, sequela: Secondary | ICD-10-CM | POA: Diagnosis not present

## 2023-05-25 DIAGNOSIS — Z96642 Presence of left artificial hip joint: Secondary | ICD-10-CM | POA: Diagnosis present

## 2023-05-25 DIAGNOSIS — S14109D Unspecified injury at unspecified level of cervical spinal cord, subsequent encounter: Secondary | ICD-10-CM | POA: Diagnosis not present

## 2023-05-25 DIAGNOSIS — M25551 Pain in right hip: Secondary | ICD-10-CM | POA: Diagnosis not present

## 2023-05-25 DIAGNOSIS — M21371 Foot drop, right foot: Secondary | ICD-10-CM | POA: Diagnosis not present

## 2023-05-25 DIAGNOSIS — R6510 Systemic inflammatory response syndrome (SIRS) of non-infectious origin without acute organ dysfunction: Secondary | ICD-10-CM | POA: Diagnosis not present

## 2023-05-25 DIAGNOSIS — I4510 Unspecified right bundle-branch block: Secondary | ICD-10-CM | POA: Diagnosis not present

## 2023-05-25 DIAGNOSIS — Z9842 Cataract extraction status, left eye: Secondary | ICD-10-CM

## 2023-05-25 DIAGNOSIS — Z8673 Personal history of transient ischemic attack (TIA), and cerebral infarction without residual deficits: Secondary | ICD-10-CM | POA: Diagnosis not present

## 2023-05-25 DIAGNOSIS — M898X9 Other specified disorders of bone, unspecified site: Secondary | ICD-10-CM | POA: Diagnosis not present

## 2023-05-25 DIAGNOSIS — Z4789 Encounter for other orthopedic aftercare: Secondary | ICD-10-CM | POA: Diagnosis not present

## 2023-05-25 DIAGNOSIS — M7061 Trochanteric bursitis, right hip: Secondary | ICD-10-CM | POA: Diagnosis not present

## 2023-05-25 DIAGNOSIS — R41844 Frontal lobe and executive function deficit: Secondary | ICD-10-CM | POA: Diagnosis not present

## 2023-05-25 DIAGNOSIS — K5901 Slow transit constipation: Secondary | ICD-10-CM | POA: Diagnosis not present

## 2023-05-25 DIAGNOSIS — D631 Anemia in chronic kidney disease: Secondary | ICD-10-CM | POA: Diagnosis present

## 2023-05-25 DIAGNOSIS — G9389 Other specified disorders of brain: Secondary | ICD-10-CM | POA: Diagnosis not present

## 2023-05-25 DIAGNOSIS — I959 Hypotension, unspecified: Secondary | ICD-10-CM | POA: Diagnosis not present

## 2023-05-25 DIAGNOSIS — R9431 Abnormal electrocardiogram [ECG] [EKG]: Secondary | ICD-10-CM | POA: Diagnosis not present

## 2023-05-25 DIAGNOSIS — S12300G Unspecified displaced fracture of fourth cervical vertebra, subsequent encounter for fracture with delayed healing: Secondary | ICD-10-CM | POA: Diagnosis not present

## 2023-05-25 DIAGNOSIS — M1611 Unilateral primary osteoarthritis, right hip: Secondary | ICD-10-CM | POA: Diagnosis not present

## 2023-05-25 DIAGNOSIS — M47812 Spondylosis without myelopathy or radiculopathy, cervical region: Secondary | ICD-10-CM | POA: Diagnosis not present

## 2023-05-25 DIAGNOSIS — Z823 Family history of stroke: Secondary | ICD-10-CM | POA: Diagnosis not present

## 2023-05-25 DIAGNOSIS — G40909 Epilepsy, unspecified, not intractable, without status epilepticus: Secondary | ICD-10-CM | POA: Diagnosis present

## 2023-05-25 DIAGNOSIS — Z7682 Awaiting organ transplant status: Secondary | ICD-10-CM | POA: Diagnosis not present

## 2023-05-25 DIAGNOSIS — K5900 Constipation, unspecified: Secondary | ICD-10-CM | POA: Diagnosis not present

## 2023-05-25 DIAGNOSIS — I12 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 stage renal disease: Secondary | ICD-10-CM | POA: Diagnosis not present

## 2023-05-25 DIAGNOSIS — K219 Gastro-esophageal reflux disease without esophagitis: Secondary | ICD-10-CM | POA: Diagnosis not present

## 2023-05-25 DIAGNOSIS — Z961 Presence of intraocular lens: Secondary | ICD-10-CM | POA: Diagnosis present

## 2023-05-25 DIAGNOSIS — N250 Renal osteodystrophy: Secondary | ICD-10-CM | POA: Diagnosis not present

## 2023-05-25 DIAGNOSIS — S14109S Unspecified injury at unspecified level of cervical spinal cord, sequela: Secondary | ICD-10-CM | POA: Diagnosis not present

## 2023-05-25 DIAGNOSIS — R34 Anuria and oliguria: Secondary | ICD-10-CM | POA: Diagnosis not present

## 2023-05-25 DIAGNOSIS — R41841 Cognitive communication deficit: Secondary | ICD-10-CM | POA: Diagnosis not present

## 2023-05-25 DIAGNOSIS — M4802 Spinal stenosis, cervical region: Secondary | ICD-10-CM | POA: Diagnosis not present

## 2023-05-25 DIAGNOSIS — R41840 Attention and concentration deficit: Secondary | ICD-10-CM | POA: Diagnosis not present

## 2023-05-25 DIAGNOSIS — I951 Orthostatic hypotension: Secondary | ICD-10-CM | POA: Diagnosis not present

## 2023-05-25 DIAGNOSIS — Z96611 Presence of right artificial shoulder joint: Secondary | ICD-10-CM | POA: Diagnosis present

## 2023-05-25 DIAGNOSIS — I9589 Other hypotension: Secondary | ICD-10-CM | POA: Diagnosis not present

## 2023-05-25 DIAGNOSIS — I69322 Dysarthria following cerebral infarction: Secondary | ICD-10-CM | POA: Diagnosis not present

## 2023-05-25 DIAGNOSIS — Z79899 Other long term (current) drug therapy: Secondary | ICD-10-CM

## 2023-05-25 DIAGNOSIS — S14109A Unspecified injury at unspecified level of cervical spinal cord, initial encounter: Secondary | ICD-10-CM | POA: Diagnosis not present

## 2023-05-25 DIAGNOSIS — R531 Weakness: Secondary | ICD-10-CM | POA: Diagnosis not present

## 2023-05-25 DIAGNOSIS — S0003XA Contusion of scalp, initial encounter: Secondary | ICD-10-CM | POA: Diagnosis not present

## 2023-05-25 DIAGNOSIS — Z8049 Family history of malignant neoplasm of other genital organs: Secondary | ICD-10-CM

## 2023-05-25 DIAGNOSIS — M419 Scoliosis, unspecified: Secondary | ICD-10-CM | POA: Diagnosis not present

## 2023-05-25 DIAGNOSIS — R509 Fever, unspecified: Secondary | ICD-10-CM | POA: Diagnosis not present

## 2023-05-25 DIAGNOSIS — M25569 Pain in unspecified knee: Secondary | ICD-10-CM | POA: Diagnosis not present

## 2023-05-25 DIAGNOSIS — S0081XA Abrasion of other part of head, initial encounter: Secondary | ICD-10-CM | POA: Diagnosis present

## 2023-05-25 HISTORY — PX: ANTERIOR CERVICAL CORPECTOMY: SHX1159

## 2023-05-25 LAB — CBC WITH DIFFERENTIAL/PLATELET
Abs Immature Granulocytes: 0.02 10*3/uL (ref 0.00–0.07)
Basophils Absolute: 0.1 10*3/uL (ref 0.0–0.1)
Basophils Relative: 2 %
Eosinophils Absolute: 0.3 10*3/uL (ref 0.0–0.5)
Eosinophils Relative: 5 %
HCT: 31.8 % — ABNORMAL LOW (ref 39.0–52.0)
Hemoglobin: 9.9 g/dL — ABNORMAL LOW (ref 13.0–17.0)
Immature Granulocytes: 0 %
Lymphocytes Relative: 11 %
Lymphs Abs: 0.7 10*3/uL (ref 0.7–4.0)
MCH: 28.7 pg (ref 26.0–34.0)
MCHC: 31.1 g/dL (ref 30.0–36.0)
MCV: 92.2 fL (ref 80.0–100.0)
Monocytes Absolute: 0.9 10*3/uL (ref 0.1–1.0)
Monocytes Relative: 14 %
Neutro Abs: 4.4 10*3/uL (ref 1.7–7.7)
Neutrophils Relative %: 68 %
Platelets: 203 10*3/uL (ref 150–400)
RBC: 3.45 MIL/uL — ABNORMAL LOW (ref 4.22–5.81)
RDW: 16.1 % — ABNORMAL HIGH (ref 11.5–15.5)
WBC: 6.4 10*3/uL (ref 4.0–10.5)
nRBC: 0 % (ref 0.0–0.2)

## 2023-05-25 LAB — CBG MONITORING, ED: Glucose-Capillary: 81 mg/dL (ref 70–99)

## 2023-05-25 LAB — PROTIME-INR
INR: 1.1 (ref 0.8–1.2)
Prothrombin Time: 14.1 s (ref 11.4–15.2)

## 2023-05-25 LAB — COMPREHENSIVE METABOLIC PANEL WITH GFR
ALT: 13 U/L (ref 0–44)
AST: 21 U/L (ref 15–41)
Albumin: 3.2 g/dL — ABNORMAL LOW (ref 3.5–5.0)
Alkaline Phosphatase: 70 U/L (ref 38–126)
Anion gap: 12 (ref 5–15)
BUN: 35 mg/dL — ABNORMAL HIGH (ref 8–23)
CO2: 32 mmol/L (ref 22–32)
Calcium: 9.8 mg/dL (ref 8.9–10.3)
Chloride: 96 mmol/L — ABNORMAL LOW (ref 98–111)
Creatinine, Ser: 7.18 mg/dL — ABNORMAL HIGH (ref 0.61–1.24)
GFR, Estimated: 8 mL/min — ABNORMAL LOW (ref >60)
Glucose, Bld: 107 mg/dL — ABNORMAL HIGH (ref 70–99)
Potassium: 3.8 mmol/L (ref 3.5–5.1)
Sodium: 140 mmol/L (ref 135–145)
Total Bilirubin: 0.6 mg/dL (ref 0.0–1.2)
Total Protein: 7.4 g/dL (ref 6.5–8.1)

## 2023-05-25 LAB — I-STAT CHEM 8, ED
BUN: 36 mg/dL — ABNORMAL HIGH (ref 8–23)
Calcium, Ion: 1.16 mmol/L (ref 1.15–1.40)
Chloride: 97 mmol/L — ABNORMAL LOW (ref 98–111)
Creatinine, Ser: 7.6 mg/dL — ABNORMAL HIGH (ref 0.61–1.24)
Glucose, Bld: 89 mg/dL (ref 70–99)
HCT: 31 % — ABNORMAL LOW (ref 39.0–52.0)
Hemoglobin: 10.5 g/dL — ABNORMAL LOW (ref 13.0–17.0)
Potassium: 3.8 mmol/L (ref 3.5–5.1)
Sodium: 140 mmol/L (ref 135–145)
TCO2: 35 mmol/L — ABNORMAL HIGH (ref 22–32)

## 2023-05-25 LAB — APTT: aPTT: 30 s (ref 24–36)

## 2023-05-25 SURGERY — ANTERIOR CERVICAL CORPECTOMY
Anesthesia: General | Site: Neck

## 2023-05-25 MED ORDER — THROMBIN 5000 UNITS EX KIT
PACK | CUTANEOUS | Status: AC
  Filled 2023-05-25: qty 1

## 2023-05-25 MED ORDER — PANTOPRAZOLE SODIUM 40 MG PO TBEC
40.0000 mg | DELAYED_RELEASE_TABLET | Freq: Two times a day (BID) | ORAL | Status: DC
  Administered 2023-05-26 – 2023-05-30 (×8): 40 mg via ORAL
  Filled 2023-05-25 (×8): qty 1

## 2023-05-25 MED ORDER — SEVELAMER CARBONATE 800 MG PO TABS
3200.0000 mg | ORAL_TABLET | Freq: Three times a day (TID) | ORAL | Status: DC
Start: 2023-05-26 — End: 2023-05-30
  Administered 2023-05-26 (×2): 4000 mg via ORAL
  Administered 2023-05-26 – 2023-05-27 (×2): 3200 mg via ORAL
  Administered 2023-05-28 (×2): 4000 mg via ORAL
  Administered 2023-05-28 – 2023-05-29 (×3): 3200 mg via ORAL
  Filled 2023-05-25 (×14): qty 5

## 2023-05-25 MED ORDER — THROMBIN 20000 UNITS EX SOLR
CUTANEOUS | Status: AC
  Filled 2023-05-25: qty 20000

## 2023-05-25 MED ORDER — CEFAZOLIN SODIUM-DEXTROSE 2-4 GM/100ML-% IV SOLN
INTRAVENOUS | Status: AC
Start: 2023-05-25 — End: 2023-05-26
  Filled 2023-05-25: qty 100

## 2023-05-25 MED ORDER — MIDAZOLAM HCL 2 MG/2ML IJ SOLN
INTRAMUSCULAR | Status: AC
Start: 2023-05-25 — End: ?
  Filled 2023-05-25: qty 2

## 2023-05-25 MED ORDER — CINACALCET HCL 30 MG PO TABS
120.0000 mg | ORAL_TABLET | Freq: Every day | ORAL | Status: DC
  Administered 2023-05-26 – 2023-05-29 (×4): 120 mg via ORAL
  Filled 2023-05-25 (×5): qty 4

## 2023-05-25 MED ORDER — ATORVASTATIN CALCIUM 10 MG PO TABS
10.0000 mg | ORAL_TABLET | ORAL | Status: DC
Start: 2023-05-30 — End: 2023-05-30
  Administered 2023-05-30: 10 mg via ORAL
  Filled 2023-05-25: qty 1

## 2023-05-25 MED ORDER — ONDANSETRON HCL 4 MG/2ML IJ SOLN
4.0000 mg | Freq: Four times a day (QID) | INTRAMUSCULAR | Status: DC | PRN
Start: 2023-05-25 — End: 2023-05-30

## 2023-05-25 MED ORDER — SODIUM CHLORIDE 0.9% FLUSH
3.0000 mL | Freq: Two times a day (BID) | INTRAVENOUS | Status: DC
  Administered 2023-05-26 – 2023-05-30 (×8): 3 mL via INTRAVENOUS

## 2023-05-25 MED ORDER — SODIUM CHLORIDE 0.9 % IV SOLN: 250.0000 mL | INTRAVENOUS | Status: AC | PRN

## 2023-05-25 MED ORDER — PROPOFOL 10 MG/ML IV BOLUS
INTRAVENOUS | Status: AC
Start: 2023-05-25 — End: ?
  Filled 2023-05-25: qty 20

## 2023-05-25 MED ORDER — ONDANSETRON HCL 4 MG PO TABS: 4.0000 mg | ORAL_TABLET | Freq: Four times a day (QID) | ORAL | Status: DC | PRN

## 2023-05-25 MED ORDER — SUCRALFATE 1 GM/10ML PO SUSP: 1.0000 g | Freq: Two times a day (BID) | ORAL | Status: DC | PRN

## 2023-05-25 MED ORDER — SODIUM CHLORIDE 0.9% FLUSH
3.0000 mL | Freq: Two times a day (BID) | INTRAVENOUS | Status: DC
  Administered 2023-05-27 – 2023-05-28 (×3): 3 mL via INTRAVENOUS

## 2023-05-25 MED ORDER — ACETAMINOPHEN 10 MG/ML IV SOLN: 1000.0000 mg | Freq: Three times a day (TID) | INTRAVENOUS | Status: DC | PRN

## 2023-05-25 MED ORDER — SODIUM CHLORIDE 0.9% FLUSH: 3.0000 mL | INTRAVENOUS | Status: DC | PRN

## 2023-05-25 MED ORDER — HYDRALAZINE HCL 20 MG/ML IJ SOLN: 10.0000 mg | Freq: Four times a day (QID) | INTRAMUSCULAR | Status: DC | PRN

## 2023-05-25 MED ORDER — FENTANYL CITRATE (PF) 250 MCG/5ML IJ SOLN
INTRAMUSCULAR | Status: AC
  Filled 2023-05-25: qty 5

## 2023-05-25 SURGICAL SUPPLY — 48 items
BAG COUNTER SPONGE SURGICOUNT (BAG) ×1 IMPLANT
BAND RUBBER #18 3X1/16 STRL (MISCELLANEOUS) ×2 IMPLANT
BENZOIN TINCTURE PRP APPL 2/3 (GAUZE/BANDAGES/DRESSINGS) ×1 IMPLANT
BIT DRILL 13 (BIT) IMPLANT
BUR MATCHSTICK NEURO 3.0 LAGG (BURR) ×1 IMPLANT
CAGE END CAP 9X14X11 (Cage) IMPLANT
CAGE PEEK 11X14X11 (Cage) IMPLANT
CANISTER SUCT 3000ML PPV (MISCELLANEOUS) ×1 IMPLANT
DRAPE C-ARM 42X72 X-RAY (DRAPES) ×2 IMPLANT
DRAPE LAPAROTOMY 100X72 PEDS (DRAPES) ×1 IMPLANT
DRAPE MICROSCOPE SLANT 54X150 (MISCELLANEOUS) ×1 IMPLANT
DRSG OPSITE POSTOP 4X6 (GAUZE/BANDAGES/DRESSINGS) IMPLANT
DURAPREP 6ML APPLICATOR 50/CS (WOUND CARE) ×1 IMPLANT
ELECT COATED BLADE 2.86 ST (ELECTRODE) ×1 IMPLANT
ELECT REM PT RETURN 9FT ADLT (ELECTROSURGICAL) ×1 IMPLANT
ELECTRODE REM PT RTRN 9FT ADLT (ELECTROSURGICAL) ×1 IMPLANT
GAUZE 4X4 16PLY ~~LOC~~+RFID DBL (SPONGE) IMPLANT
GAUZE SPONGE 4X4 12PLY STRL (GAUZE/BANDAGES/DRESSINGS) ×1 IMPLANT
GLOVE BIO SURGEON STRL SZ7 (GLOVE) IMPLANT
GLOVE BIOGEL M STRL SZ7.5 (GLOVE) IMPLANT
GLOVE ECLIPSE 7.0 STRL STRAW (GLOVE) IMPLANT
GLOVE ECLIPSE 9.0 STRL (GLOVE) ×1 IMPLANT
GLOVE EXAM NITRILE XL STR (GLOVE) IMPLANT
GOWN STRL REUS W/ TWL LRG LVL3 (GOWN DISPOSABLE) IMPLANT
GOWN STRL REUS W/ TWL XL LVL3 (GOWN DISPOSABLE) ×1 IMPLANT
GOWN STRL REUS W/TWL 2XL LVL3 (GOWN DISPOSABLE) IMPLANT
HALTER HD/CHIN CERV TRACTION D (MISCELLANEOUS) ×1 IMPLANT
HEMOSTAT POWDER KIT SURGIFOAM (HEMOSTASIS) IMPLANT
KIT BASIN OR (CUSTOM PROCEDURE TRAY) ×1 IMPLANT
KIT TURNOVER KIT B (KITS) ×1 IMPLANT
NDL SPNL 20GX3.5 QUINCKE YW (NEEDLE) ×1 IMPLANT
NEEDLE SPNL 20GX3.5 QUINCKE YW (NEEDLE) ×1 IMPLANT
NS IRRIG 1000ML POUR BTL (IV SOLUTION) ×1 IMPLANT
PACK LAMINECTOMY NEURO (CUSTOM PROCEDURE TRAY) ×1 IMPLANT
PAD ARMBOARD POSITIONER FOAM (MISCELLANEOUS) ×3 IMPLANT
PEEK ANATOMIC STRUT 5X14X11MM (Peek) IMPLANT
PLATE ELITE 42MM (Plate) IMPLANT
SCREW ST FIX 4 ATL 3120213 (Screw) IMPLANT
SPONGE INTESTINAL PEANUT (DISPOSABLE) ×1 IMPLANT
SPONGE SURGIFOAM ABS GEL 100 (HEMOSTASIS) ×1 IMPLANT
STRIP CLOSURE SKIN 1/2X4 (GAUZE/BANDAGES/DRESSINGS) ×1 IMPLANT
SUT VIC AB 3-0 SH 8-18 (SUTURE) IMPLANT
SUT VIC AB 4-0 RB1 18 (SUTURE) ×1 IMPLANT
TAPE CLOTH 4X10 WHT NS (GAUZE/BANDAGES/DRESSINGS) ×1 IMPLANT
TOWEL GREEN STERILE (TOWEL DISPOSABLE) ×1 IMPLANT
TOWEL GREEN STERILE FF (TOWEL DISPOSABLE) ×1 IMPLANT
TRAP SPECIMEN MUCUS 40CC (MISCELLANEOUS) ×1 IMPLANT
WATER STERILE IRR 1000ML POUR (IV SOLUTION) ×1 IMPLANT

## 2023-05-25 NOTE — ED Notes (Signed)
 Code stroke activated. LKW 3:00pm

## 2023-05-25 NOTE — ED Notes (Signed)
 Unsuccessful attempt at a ultrasound guided IV.

## 2023-05-25 NOTE — ED Notes (Signed)
 Delay on CTA due to infiltration in the IV.

## 2023-05-25 NOTE — ED Notes (Signed)
 EDP Countryman at bedside for IV attempt.

## 2023-05-25 NOTE — ED Notes (Signed)
 This RN came on shift at 1900 and immediately went to CT with patient and primary dayshift RN due to MD Countryman activating code stroke. While in CT, RN Elexes attempted to obtain IV access via ultrasound and CT Head WO contrast was obtained.  Neurology MD arrived and patient was then activated as a level 2 trauma when further hx was obtained from pt. C-collar placed at this time. IV access infiltrated during CTA and MD countryman placed an 18g in the LUA at 1945. CTA was attempted again and IV access infiltrated a second time at 1956. Neurology MD requested patient go straight to MRI. This RN and neurology MD arrived at MRI at 2005.

## 2023-05-25 NOTE — Consult Note (Signed)
 Reason for Consult: C4 fracture Referring Physician: Emergency department  Lawana Pray Sr. is an 63 y.o. male.  HPI: 63 year old male with history of end-stage renal disease on hemodialysis.  Patient has had difficulty with progressive numbness and some pain in both upper extremities.  He has associated gait imbalance and some degree of weakness.  The patient suffered a ground-level fall today with profound worsening of his weakness.  Workup is demonstrated evidence for C4 burst fracture with severe canal compromised superimposed upon significant cervical degenerative change with likely pre-existing stenosis as well.  There is signal abnormality within the spinal cord at the fracture level.  There is no evidence of other complicating features.  Head CT scan and MRI scan of his brain was negative.  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    Parathyroid abnormality (HCC)    ectopic parathyroid gland   Presence of arteriovenous fistula for hemodialysis, primary (HCC)    RUE PER PT RLE   Refusal of blood product    NO WHOLE BLOOD PROUCTS   Renal cell carcinoma (HCC)    s/p hand assisted laparoscopic bilateral nephrectomies 11/29/17, + RCC left   Secondary hyperparathyroidism (HCC)    Seizures (HCC)    one episode in past, due to" elevated Potassium" 08/02/20- "at least 4 years ago"   Sleep apnea    doesn't use CPAP anymore since weight loss   Stroke [facility])    no  residual    Past Surgical History:  Procedure Laterality Date   AV FISTULA PLACEMENT Right    right arm   BIOPSY  08/01/2019   Procedure: BIOPSY;  Surgeon: Tami Falcon, MD;  Location: [facility] ENDOSCOPY;  Service: Endoscopy;;   BIOPSY  11/18/2020   Procedure: BIOPSY;  Surgeon: Elois Hair, MD;  Location: [facility] ENDOSCOPY;  Service: Gastroenterology;;   BIOPSY  09/13/2021   Procedure: BIOPSY;  Surgeon: Normie Becton., MD;  Location: [facility] ENDOSCOPY;  Service: Gastroenterology;;   CATARACT EXTRACTION W/ INTRAOCULAR LENS  IMPLANT, BILATERAL     COLON SURGERY     COLONOSCOPY N/A 08/04/2015   Procedure: COLONOSCOPY;  Surgeon: Nannette Babe, MD;  Location: [facility] ENDOSCOPY;  Service: Endoscopy;  Laterality: N/A;   COLONOSCOPY  2017   JMP@ [facility]-good prep-mass -recall 1 yr   COLONOSCOPY N/A 09/13/2021   Procedure: COLONOSCOPY;  Surgeon: Mansouraty, Albino Alu., MD;  Location: [facility] ENDOSCOPY;  Service: Gastroenterology;  Laterality: N/A;   COLONOSCOPY WITH PROPOFOL N/A 11/25/2020   Procedure: COLONOSCOPY WITH PROPOFOL;  Surgeon: Daina Drum, MD;  Location: [facility] ENDOSCOPY;  Service: Gastroenterology;  Laterality: N/A;   COLONOSCOPY WITH PROPOFOL N/A 09/23/2021   Procedure: COLONOSCOPY WITH PROPOFOL;  Surgeon: Annis Kinder, DO;  Location: [facility] ENDOSCOPY;  Service: Gastroenterology;  Laterality: N/A;   ENTEROSCOPY  N/A 09/23/2021   Procedure: ENTEROSCOPY;  Surgeon: Annis Kinder, DO;  Location: [facility] ENDOSCOPY;  Service: Gastroenterology;  Laterality: N/A;  Will place order for video capsule study as we may opt to place it during procedure   ESOPHAGOGASTRODUODENOSCOPY N/A 08/01/2019   Procedure: ESOPHAGOGASTRODUODENOSCOPY (EGD);  Surgeon: Tami Falcon, MD;  Location: [facility] ENDOSCOPY;  Service: Endoscopy;  Laterality: N/A;   ESOPHAGOGASTRODUODENOSCOPY N/A 11/18/2020   Procedure: ESOPHAGOGASTRODUODENOSCOPY (EGD);  Surgeon: Elois Hair, MD;  Location: [facility] ENDOSCOPY;  Service: Gastroenterology;   Laterality: N/A;   ESOPHAGOGASTRODUODENOSCOPY (EGD) WITH PROPOFOL N/A 08/04/2019   Procedure: ESOPHAGOGASTRODUODENOSCOPY (EGD) WITH PROPOFOL;  Surgeon: Ace Holder, MD;  Location: [facility] ENDOSCOPY;  Service: Gastroenterology;  Laterality: N/A;   ESOPHAGOGASTRODUODENOSCOPY (EGD) WITH PROPOFOL N/A 09/13/2021   Procedure: ESOPHAGOGASTRODUODENOSCOPY (EGD) WITH PROPOFOL;  Surgeon: Brice Campi Albino Alu., MD;  Location: [facility] ENDOSCOPY;  Service: Gastroenterology;  Laterality: N/A;   GIVENS CAPSULE STUDY N/A 09/23/2021   Procedure: GIVENS CAPSULE STUDY;  Surgeon: Annis Kinder, DO;  Location: [facility] ENDOSCOPY;  Service: Gastroenterology;  Laterality: N/A;   graft left arm Left    for dialysis x 2. Removed   HOT HEMOSTASIS  11/18/2020   Procedure: HOT HEMOSTASIS (ARGON PLASMA COAGULATION/BICAP);  Surgeon: Elois Hair, MD;  Location: [facility] ENDOSCOPY;  Service: Gastroenterology;;   HOT HEMOSTASIS N/A 09/23/2021   Procedure: HOT HEMOSTASIS (ARGON PLASMA COAGULATION/BICAP);  Surgeon: Annis Kinder, DO;  Location: [facility] ENDOSCOPY;  Service: Gastroenterology;  Laterality: N/A;   INSERTION OF DIALYSIS CATHETER     Rt chest   LAPAROSCOPIC RIGHT COLECTOMY N/A 08/05/2015   Procedure: LAPAROSCOPIC RIGHT COLECTOMY- ASCENDING;  Surgeon: Lockie Rima, MD;  Location: [facility] OR;  Service: General;  Laterality: N/A;   MASS EXCISION Left 05/28/2019   Procedure: EXCISION SOFT TISSUE MASS LEFT SHOULDER;  Surgeon: Oralee Billow, MD;  Location: [facility] ORS;  Service: General;  Laterality: Left;   NEPHRECTOMY Bilateral    PARATHYROIDECTOMY N/A 06/12/2016   Procedure: TOTAL PARATHYROIDECTOMY WITH AUTOTRANSPLANTATION TO LEFT FOREARM;  Surgeon: Oralee Billow, MD;  Location: [facility] OR;  Service: General;  Laterality: N/A;   PARATHYROIDECTOMY N/A 10/23/2016   Procedure: PARATHYROIDECTOMY;  Surgeon: Oralee Billow, MD;  Location: [facility] OR;  Service: General;  Laterality: N/A;   REVERSE SHOULDER ARTHROPLASTY Right 08/24/2020   Procedure:  REVERSE SHOULDER ARTHROPLASTY;  Surgeon: Micheline Ahr, MD;  Location: [facility] OR;  Service: Orthopedics;  Laterality: Right;   REVISON OF ARTERIOVENOUS FISTULA Right 07/16/2017   Procedure: REVISION OF ARTERIOVENOUS FISTULA  Right ARM;  Surgeon: Adine Hoof, MD;  Location: [facility] OR;  Service: Vascular;  Laterality: Right;   TOTAL HIP ARTHROPLASTY Left 11/14/2020   Procedure: LEFT TOTAL HIP ARTHROPLASTY ANTERIOR APPROACH;  Surgeon: Wes Hamman, MD;  Location: [facility] OR;  Service: Orthopedics;  Laterality: Left;   UPPER GASTROINTESTINAL ENDOSCOPY  2021   @ [facility]    Family History  Problem Relation Age of Onset   Diabetes Father    Stroke Father    Hypertension Father    Uterine cancer Mother    Lupus Sister    Stroke Sister    Hypertension Sister    Anuerysm Brother        brain   Colon cancer Neg Hx    Esophageal cancer Neg Hx    Stomach cancer Neg Hx    Pancreatic cancer Neg Hx    Liver disease Neg Hx    Colon polyps Neg Hx    Rectal cancer Neg Hx  Social History:  reports that he quit smoking about 32 years ago. His smoking use included cigarettes. He has never used smokeless tobacco. He reports that he does not currently use alcohol after a past usage of about 1.0 standard drink of alcohol per week. He reports that he does not currently use drugs after having used the following drugs: Marijuana.  Allergies:  Allergies  Allergen Reactions   Infed [Iron Dextran] Other (See Comments)    Decreased BP    Oxycodone Nausea Only   Vicodin [Hydrocodone-Acetaminophen] Other (See Comments)    Decreased BP   Diclofenac Other (See Comments)    Unknown    Medications: I have reviewed the patient's current medications.  Results for orders placed or performed during the hospital encounter of 05/25/23 (from the past 48 hours)  CBC with Differential     Status: Abnormal   Collection Time: 05/25/23  6:30 PM  Result Value Ref Range   WBC 6.4 4.0 - 10.5 K/uL   RBC 3.45 (L) 4.22 -  5.81 MIL/uL   Hemoglobin 9.9 (L) 13.0 - 17.0 g/dL   HCT 11.9 (L) 14.7 - 82.9 %   MCV 92.2 80.0 - 100.0 fL   MCH 28.7 26.0 - 34.0 pg   MCHC 31.1 30.0 - 36.0 g/dL   RDW 56.2 (H) 13.0 - 86.5 %   Platelets 203 150 - 400 K/uL   nRBC 0.0 0.0 - 0.2 %   Neutrophils Relative % 68 %   Neutro Abs 4.4 1.7 - 7.7 K/uL   Lymphocytes Relative 11 %   Lymphs Abs 0.7 0.7 - 4.0 K/uL   Monocytes Relative 14 %   Monocytes Absolute 0.9 0.1 - 1.0 K/uL   Eosinophils Relative 5 %   Eosinophils Absolute 0.3 0.0 - 0.5 K/uL   Basophils Relative 2 %   Basophils Absolute 0.1 0.0 - 0.1 K/uL   Immature Granulocytes 0 %   Abs Immature Granulocytes 0.02 0.00 - 0.07 K/uL    Comment: Performed at [facility], [address] [place], [place] [address]  Comprehensive metabolic panel     Status: Abnormal   Collection Time: 05/25/23  6:30 PM  Result Value Ref Range   Sodium 140 135 - 145 mmol/L   Potassium 3.8 3.5 - 5.1 mmol/L   Chloride 96 (L) 98 - 111 mmol/L   CO2 32 22 - 32 mmol/L   Glucose, Bld 107 (H) 70 - 99 mg/dL    Comment: Glucose reference range applies only to samples taken after fasting for at least 8 hours.   BUN 35 (H) 8 - 23 mg/dL   Creatinine, Ser 6.29 (H) 0.61 - 1.24 mg/dL   Calcium 9.8 8.9 - 52.8 mg/dL   Total Protein 7.4 6.5 - 8.1 g/dL   Albumin 3.2 (L) 3.5 - 5.0 g/dL   AST 21 15 - 41 U/L   ALT 13 0 - 44 U/L   Alkaline Phosphatase 70 38 - 126 U/L   Total Bilirubin 0.6 0.0 - 1.2 mg/dL   GFR, Estimated 8 (L) >60 mL/min    Comment: (NOTE) Calculated using the CKD-EPI Creatinine Equation (2021)    Anion gap 12 5 - 15    Comment: Performed at [facility], [address] [place], [place] [address]  Protime-INR     Status: None   Collection Time: 05/25/23  6:30 PM  Result Value Ref Range   Prothrombin Time 14.1 11.4 - 15.2 seconds   INR 1.1 0.8 -  1.2    Comment: (NOTE) INR goal varies based on device and disease states. Performed at [facility], [address],  [place], [place] [address]   APTT     Status: None   Collection Time: 05/25/23  6:35 PM  Result Value Ref Range   aPTT 30 24 - 36 seconds    Comment: Performed at [facility], [address] [place], [place] [address]  CBG monitoring, ED     Status: None   Collection Time: 05/25/23  7:04 PM  Result Value Ref Range   Glucose-Capillary 81 70 - 99 mg/dL    Comment: Glucose reference range applies only to samples taken after fasting for at least 8 hours.  I-stat chem 8, ED     Status: Abnormal   Collection Time: 05/25/23  7:33 PM  Result Value Ref Range   Sodium 140 135 - 145 mmol/L   Potassium 3.8 3.5 - 5.1 mmol/L   Chloride 97 (L) 98 - 111 mmol/L   BUN 36 (H) 8 - 23 mg/dL   Creatinine, Ser 0.98 (H) 0.61 - 1.24 mg/dL   Glucose, Bld 89 70 - 99 mg/dL    Comment: Glucose reference range applies only to samples taken after fasting for at least 8 hours.   Calcium, Ion 1.16 1.15 - 1.40 mmol/L   TCO2 35 (H) 22 - 32 mmol/L   Hemoglobin 10.5 (L) 13.0 - 17.0 g/dL   HCT 11.9 (L) 14.7 - 82.9 %    MR CERVICAL SPINE WO CONTRAST Result Date: 05/25/2023 CLINICAL DATA:  Initial evaluation for acute myelopathy. EXAM: MRI CERVICAL SPINE WITHOUT CONTRAST TECHNIQUE: Multiplanar, multisequence MR imaging of the cervical spine was performed. No intravenous contrast was administered. COMPARISON:  Prior CT from earlier the same day. FINDINGS: Alignment: Reversal of the normal cervical lordosis. No significant listhesis. Vertebrae: Evolving fracture involving the C4 vertebral body again seen, corresponding with abnormality on prior CT. Associated height loss measures up to 50% with 4 mm bony retropulsion. Extension through the posterior elements on the right. Fracture is pathologic in appearance, and suspected to be related underlying renal osteodystrophy as question on prior CT. Associated prominent reactive endplate changes about the C4-5 and C5-6 interspaces. Vertebral body height otherwise maintained.  Decreased T1 signal intensity seen throughout the visualized bone marrow, suspected to be related history of end-stage renal disease. No worrisome osseous lesions. No other abnormal marrow edema. Cord: Patchy signal abnormality seen involving the cervical cord extending from C3 through C6, consistent with edema and/or evolving myelomalacia (series 10, image 9). Finding is suspected to be posttraumatic in nature related to the adjacent fracture. Posterior Fossa, vertebral arteries, paraspinal tissues: Large left mastoid effusion. Few retention cysts noted at the nasopharynx. Craniocervical junction normal. Mild diffuse prevertebral edema related to the acute C4 fracture. Suspected injury of the longitudinal ligament at the level of C4. Remainder of the major ligamentous structures appear grossly intact. Normal flow voids seen within the vertebral arteries bilaterally. Disc levels: C2-C3: Disc desiccation with mild disc bulge and uncovertebral spurring. Mild facet hypertrophy. Mild spinal stenosis. Foramina remain patent. C3-C4: Central to right paracentral disc osteophyte complex mildly flattens the ventral thecal sac. Superimposed right greater left facet hypertrophy with right-sided uncovertebral spurring. Moderate spinal stenosis. Moderate right C4 foraminal narrowing. Left neural foramina remains patent. C4-C5: Advance intervertebral disc space narrowing. 4 mm bony retropulsion related to the pathologic C4 fracture. Flattening and effacement of the ventral thecal sac. Secondary cord flattening with  associated cord signal changes as above. Superimposed right-sided facet hypertrophy. Resultant severe spinal stenosis with the thecal sac measuring 5 mm in AP diameter at its most narrow point. Moderate bilateral C5 foraminal narrowing. C5-C6: Advanced intervertebral disc space narrowing with diffuse disc osteophyte complex. Flattening of the ventral thecal sac. Superimposed mild facet hypertrophy. Mild spinal  stenosis. Moderate left C6 foraminal narrowing. Right neural foramen remains patent. C6-C7: Degenerate intervertebral disc space narrowing with diffuse disc bulge and uncovertebral spurring. Mild facet hypertrophy. No significant spinal stenosis. Foramina remain patent. C7-T1: Disc desiccation without significant disc bulge. Mild facet hypertrophy. No significant stenosis. IMPRESSION: 1. Evolving C4 fracture with 50% height loss and 4 mm bony retropulsion, subacute in appearance, and likely pathologic due to underlying renal osteodystrophy as postulated on prior CT. Underlying infection could conceivably have this appearance as well, and could be considered in the correct clinical setting. 2. Resultant severe spinal stenosis at the level of C4. Patchy signal abnormality involving the cervical cord extending from C3 through C6, consistent with edema and/or evolving myelomalacia. 3. Multifactorial degenerative changes at C3-4 and C5-6 with resultant moderate spinal stenosis at both levels. Moderate right C4, bilateral C5, left C6 foraminal narrowing. Results were discussed by telephone at the time of image acquisition on 05/25/2023 at 8:40 p.m. to provider [facility] , who verbally acknowledged these results. Electronically Signed   By: Virgia Griffins M.D.   On: 05/25/2023 21:22   CT CERVICAL SPINE WO CONTRAST Result Date: 05/25/2023 CLINICAL DATA:  Initial evaluation for acute trauma, fall. EXAM: CT CERVICAL SPINE WITHOUT CONTRAST TECHNIQUE: Multidetector CT imaging of the cervical spine was performed without intravenous contrast. Multiplanar CT image reconstructions were also generated. RADIATION DOSE REDUCTION: This exam was performed according to the departmental dose-optimization program which includes automated exposure control, adjustment of the mA and/or kV according to patient size and/or use of iterative reconstruction technique. COMPARISON:  Prior MRI from 06/27/2006. FINDINGS: Alignment:  Reversal of the normal cervical lordosis. Underlying cervicothoracic levoscoliosis. No listhesis. Skull base and vertebrae: Bones are somewhat diffusely sclerotic in appearance, suggesting a degree of underlying renal osteodystrophy. Skull base intact. Normal C1-2 articulations are preserved. Dens is intact. There is an evolving fracture involving the C4 vertebral body. Associated height loss measures up to 50% with 4 mm bony retropulsion. Fracture is pathologic in subacute in appearance, suspected to be related underlying renal osteodystrophy. Extension through the right posterior elements and C4 facet (series 7, image 37). Vertebral body height otherwise maintained. No worrisome osseous lesions. Soft tissues and spinal canal: Mild diffuse prevertebral edema. Vascular calcifications noted about the carotid bifurcations. Postoperative changes noted about the thyroid. Disc levels:  Advanced degenerative spondylosis at C4-5 and C5-6. Upper chest: Visualized upper chest demonstrates no acute finding. Other: None. IMPRESSION: 1. Evolving subacute fracture involving the C4 vertebral body with up to 50% height loss and 4 mm bony retropulsion. Extension through the right posterior elements/articular facet of C4. 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 2. Underlying advanced degenerative spondylosis at C4-5 and C5-6. Critical Value/emergent results were discussed by telephone at the time of image acquisition on 05/25/2023 at approximately 7:20 p.m. to provider Dr. Murvin Arthurs , who verbally acknowledged these results. Electronically Signed   By: Virgia Griffins M.D.   On: 05/25/2023 21:01   MR BRAIN WO CONTRAST Result Date: 05/25/2023 CLINICAL DATA:  Initial evaluation for acute neuro deficit, stroke suspected. EXAM: MRI HEAD WITHOUT CONTRAST TECHNIQUE: Multiplanar, multiecho pulse sequences  of the brain and surrounding structures were obtained without  intravenous contrast. COMPARISON:  CT from earlier the same day. FINDINGS: Brain: Cerebral volume within normal limits. Patchy T2/FLAIR hyperintensity involving the periventricular deep white matter both cerebral hemispheres, consistent chronic small vessel ischemic disease, mild in nature. Remote lacunar infarct present at the anterior left basal ganglia. Encephalomalacia and gliosis involving the left occipital lobe, consistent with a chronic left PCA distribution infarct. This corresponds with abnormality on prior CT. Mild chronic hemosiderin staining at this location. No evidence for acute or subacute ischemia. Gray-white matter differentiation maintained. No other areas of chronic cortical infarction. No other acute or chronic intracranial blood products. No mass lesion, midline shift or mass effect. No hydrocephalus or extra-axial fluid collection. Pituitary gland within normal limits. Vascular: Major intracranial vascular flow voids are well maintained. Skull and upper cervical spine: Craniocervical junction within normal limits. Decreased T1 signal intensity noted within the visualized bone marrow, nonspecific, but most commonly related to anemia, smoking or obesity. Probable evolving C4 fracture, better seen on corresponding MRI of the cervical spine. Soft tissue contusion at the left frontal scalp. Sinuses/Orbits: Prior bilateral ocular lens replacement. Paranasal sinuses are largely clear. Large left mastoid and middle ear effusion, with some fluid signal intensity within the left petrous apex. Few small retention cysts noted at the nasopharynx. Other: None. IMPRESSION: 1. No acute intracranial abnormality. 2. Small soft tissue contusion at the left frontal scalp. 3. Chronic left PCA distribution infarct. 4. Underlying mild chronic microvascular ischemic disease with remote lacunar infarct at the anterior left basal ganglia. 5. Probable evolving C4 fracture, better seen on corresponding MRI of the  cervical spine. 6. Large left mastoid and middle ear effusion. Several small retention cyst present at the nasopharynx, suggesting that this finding is postobstructive in nature. Electronically Signed   By: Virgia Griffins M.D.   On: 05/25/2023 20:51   CT HEAD CODE STROKE WO CONTRAST Result Date: 05/25/2023 CLINICAL DATA:  Code stroke. Initial evaluation for acute neuro deficit, stroke suspected. EXAM: CT HEAD WITHOUT CONTRAST TECHNIQUE: Contiguous axial images were obtained from the base of the skull through the vertex without intravenous contrast. RADIATION DOSE REDUCTION: This exam was performed according to the departmental dose-optimization program which includes automated exposure control, adjustment of the mA and/or kV according to patient size and/or use of iterative reconstruction technique. COMPARISON:  CT from 09/11/2021 FINDINGS: Brain: Extensive dural calcifications again noted. Focal hypodensity involving the left occipital lobe, consistent with a small acute left PCA distribution infarct (image 17). No convincing acute intracranial hemorrhage. Note made of a small apparent focal hyperdensity at the high left frontal convexity, seen on axial slice (series 2, image 24), not confidently seen on corresponding sequences, and favored to be artifactual. No other acute large vessel territory infarct. No mass lesion or midline shift. No hydrocephalus. No extra-axial fluid collection. Vascular: No abnormal hyperdense vessel. Scattered calcified atherosclerosis present at the skull base. Skull: Soft tissue contusion present at the left frontal scalp near the vertex. Calvarium intact. Sinuses/Orbits: Globes orbital soft tissues within normal limits. Paranasal sinuses are largely clear. Large left mastoid and middle ear effusion. Mild asymmetric soft tissue fullness at the left nasopharynx without discrete mass (series 2, image 6). Other: None. ASPECTS [facility] Stroke Program Early CT Score) - Ganglionic  level infarction (caudate, lentiform nuclei, internal capsule, insula, M1-M3 cortex): 7 - Supraganglionic infarction (M4-M6 cortex): 3 Total score (0-10 with 10 being normal): 10 IMPRESSION: 1. Small acute left PCA distribution infarct.  No acute intracranial hemorrhage. 2. Aspects equals 10. 3. Soft tissue contusion at the left frontal scalp. No calvarial fracture. No acute intracranial hemorrhage. 4. Underlying atrophy with chronic small vessel ischemic disease. Chronic lacunar infarct at the left basal ganglia. Case discussed by telephone at the time of interpretation on 05/25/2023 at 7:18 p.m. To provider Dr. Murvin Arthurs. Electronically Signed   By: Virgia Griffins M.D.   On: 05/25/2023 19:35    Pertinent items noted in HPI and remainder of comprehensive ROS otherwise negative. Blood pressure (!) 183/99, pulse 90, temperature 98.3 F (36.8 C), resp. rate 12, height 6\' 1"  (1.854 m), weight 86.1 kg, SpO2 100%. Patient is awake and alert.  He is oriented and appropriate.  His speech is fluent.  His judgment insight appear intact.  Cranial nerve function normal bilateral.  Motor examination of his extremities reveal 4/5 strength in his deltoid muscle group bilaterally.  He has 4-/5 strength in his biceps and wrist extensors bilaterally.  His right triceps strength is 2/5.  His left tricep strength is 3/5.  Grips are 2/5 bilaterally.  Intrinsics are 1/5 bilaterally.  Lower extremity strength is 4-/5 bilaterally with some increased tone.  Examination of head ears eyes and throat demonstrate a small scalp contusion without laceration or bony abnormality.  Oropharynx, nasopharynx and external auditory canals are clear.  Chest and abdomen are otherwise benign.  Extremities are free from injury deformity aside from his right sided AV fistula.  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  Patient with ongoing severe stenosis and spinal cord signal abnormality.  I discussed situation  with the patient and his family.  I recommended that we move forward emergently with a C4 corpectomy with C3-C5 anterior cervical fusion utilizing interbody cage and local autograft coupled with anterior plate fixation.  I have discussed the risks involved with surgery including not limited to risk anesthesia, bleeding, infection, CSF leak, nerve root injury, spinal cord injury, fusion failure, , dysphagia, dysphonia, continued pain, none benefit.  Patient has been given the option as questions.  He appears understand.  He wishes to proceed with surgery.  Awilda Bogus Eulalia Ellerman 05/25/2023, 10:43 PM

## 2023-05-25 NOTE — Progress Notes (Signed)
 Orthopedic Tech Progress Note Patient Details:  Carl Nearhood Sr. September 17, 1960 [id number]  Level I trauma, downgraded to a Level II. No ortho tech orders at this time.  Patient ID: Carl Burdette Sr., male   DOB: May 10, 1960, 63 y.o.   MRN: [id number]  [name] 05/25/2023, 7:28 PM

## 2023-05-25 NOTE — ED Notes (Signed)
 This RN just returned to unit with patient at this time.

## 2023-05-25 NOTE — ED Notes (Addendum)
 RN from PACU here to transport patient to OR at this time. RN informed that patient does not have IV access at this time.

## 2023-05-25 NOTE — ED Triage Notes (Addendum)
 Pt POV with son d/t fall - hit head - NOT on thinners but cannot walk.  Pt's son put picked him up and placed him in [facility].  Pt able to move a little but not stand.  Pt has abrasion on left forehead.  Pt states his legs went out and then fell.  Pt may have had LOC.  He is not sure.  Pt also c/o of loss of function of hands also.  Pt states he has hx of losing function and being seen for it.

## 2023-05-25 NOTE — H&P (Addendum)
 History and Physical    Carl Prom Sr. WUJ:[id number] DOB: [birth year] DOA: 05/25/2023  PCP: Lanae Pinal, MD   Patient coming from: Home   Chief Complaint:  Chief Complaint  Patient presents with   Fall   ED TRIAGE note:    Pt POV with son d/t fall - hit head - NOT on thinners but cannot walk.  Pt's son put picked him up and placed him in [facility].  Pt able to move a little but not stand.  Pt has abrasion on left forehead.  Pt states his legs went out and then fell.  Pt may have had LOC.  He is not sure.  Pt also c/o of loss of function of hands also.  Pt states he has hx of losing function and being seen for it.       HPI:  Carl Rodarte Sr. is a 63 y.o. male with medical history significant of ESRD on dialysis MWF schedule, history of GI bleed, GERD, OSA, seizure, essential hypertension and prior CVA presented emergency department after a fall and experiencing bilateral upper lower extremity significant weakness since the fall. Patient reported that he took around 3 steps suddenly knee gave out and fall on the floor facing forward and is unable to get up due to significant upper lower extremity weakness.  Reporting that his legs feeling heavy cannot lift up also bilateral arm and forearm feeling weaker.  He drove himself to the emergency department and in the ED initially code stroke was activated.  Patient denying any loss of consciousness, blurry vision and seizure.  Unable to verify patient has any tingling in the bilateral upper and lower extremities.   ED Course:  At presentation to ED patient found to be having elevated blood pressure 183/99 otherwise hemodynamically stable. CBC unremarkable stable H&H 9.9 and 31.  Normal WBC platelet count. CMP evidence of ESRD BUN 35, elevated creatinine 7.18 and low GFR 8. Normal pro time INR. EKG showing normal sinus rhythm heart rate 65.  Left ventricular hypertrophy pattern.  CT head small acute left PCA distribution infarction.  No  acute intracranial hemorrhage. Unable to obtain CTA head and neck given dialysis patient. CT cervical spine showed subacute fracture involving C4 vertebral body with 50% 50% height loss and 4 mm bony retropulsion. Extension through the right posterior elements/articular facet of C4. 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 2. Underlying advanced degenerative spondylosis at C4-5 and C5-6.  MRI brain: 1. No acute intracranial abnormality. 2. Small soft tissue contusion at the left frontal scalp. 3. Chronic left PCA distribution infarct. 4. Underlying mild chronic microvascular ischemic disease with remote lacunar infarct at the anterior left basal ganglia. 5. Probable evolving C4 fracture, better seen on corresponding MRI of the cervical spine. 6. Large left mastoid and middle ear effusion. Several small retention cyst present at the nasopharynx, suggesting that this finding is postobstructive in nature.  MRI cervical spine:  Evolving C4 fracture with 50% height loss and 4 mm bony retropulsion, subacute in appearance, and likely pathologic due to underlying renal osteodystrophy as postulated on prior CT. Underlying infection could conceivably have this appearance as well, and could be considered in the correct clinical setting. 2. Resultant severe spinal stenosis at the level of C4. Patchy signal abnormality involving the cervical cord extending from C3 through C6, consistent with edema and/or evolving myelomalacia. 3. Multifactorial degenerative changes at C3-4 and C5-6 with resultant moderate spinal stenosis at both levels. Moderate right  C4, bilateral C5, left C6 foraminal narrowing.     Neurosurgery Dr. Adonis Alamin has been consulted.  Per neurosurgery C4 burst fracture with incomplete spinal cord injury. Patient with ongoing severe stenosis and spinal cord signal abnormality.  Planning to take to the OR emergently tonight for C4  corpectomy with C3-C5 anterior cervical fusion utilizing interbody cage and local autograft coupled with anterior plate fixation.   Hospitalist has been consulted for further evaluation and management of the chronic medical problem and admission to comanagement with neurosurgery for cervical vertebral fracture.  Admitting patient to progressive unit however if patient remains vent dependent postsurgery need to consult ICU for neuro-ICU admission. Discussed case with PCCM Dr. Mason Sole recommended the patient remain vent dependent need to inform ICU.   Significant labs in the ED: Lab Orders         CBC with Differential         Comprehensive metabolic panel         Protime-INR         APTT         Urinalysis, Routine w reflex microscopic -Urine, Clean Catch         Comprehensive metabolic panel         CBC         Protime-INR         APTT         I-stat chem 8, ED         CBG monitoring, ED       Review of Systems:  Review of Systems  Constitutional:  Negative for malaise/fatigue.  Eyes:  Negative for blurred vision, double vision and photophobia.  Cardiovascular:  Negative for chest pain and palpitations.  Gastrointestinal:  Negative for nausea and vomiting.  Musculoskeletal:  Positive for falls. Negative for back pain, joint pain and neck pain.       Bilateral upper and lower extremities weakness and heaviness  Neurological:  Positive for weakness. Negative for dizziness, tingling, tremors, sensory change, speech change, focal weakness, seizures, loss of consciousness and headaches.  Psychiatric/Behavioral:  The patient is not nervous/anxious.     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    Parathyroid abnormality (HCC)    ectopic parathyroid gland   Presence of arteriovenous fistula for hemodialysis, primary (HCC)    RUE PER PT RLE   Refusal of blood product    NO WHOLE BLOOD PROUCTS   Renal cell carcinoma (HCC)    s/p hand assisted laparoscopic bilateral nephrectomies 11/29/17, + RCC left   Secondary hyperparathyroidism (HCC)    Seizures (HCC)    one episode in past, due to" elevated Potassium" 08/02/20- "at least 4 years ago"   Sleep apnea    doesn't use CPAP anymore since weight loss  Stroke [facility])    no residual    Past Surgical History:  Procedure Laterality Date   AV FISTULA PLACEMENT Right    right arm   BIOPSY  08/01/2019   Procedure: BIOPSY;  Surgeon: Tami Falcon, MD;  Location: [facility] ENDOSCOPY;  Service: Endoscopy;;   BIOPSY  11/18/2020   Procedure: BIOPSY;  Surgeon: Elois Hair, MD;  Location: [facility] ENDOSCOPY;  Service: Gastroenterology;;   BIOPSY  09/13/2021   Procedure: BIOPSY;  Surgeon: Normie Becton., MD;  Location: [facility] ENDOSCOPY;  Service: Gastroenterology;;   CATARACT EXTRACTION W/ INTRAOCULAR LENS  IMPLANT, BILATERAL     COLON SURGERY     COLONOSCOPY N/A 08/04/2015   Procedure: COLONOSCOPY;  Surgeon: Nannette Babe, MD;  Location: [facility] ENDOSCOPY;  Service: Endoscopy;  Laterality: N/A;   COLONOSCOPY  2017   JMP@ [facility]-good prep-mass -recall 1 yr   COLONOSCOPY N/A 09/13/2021   Procedure: COLONOSCOPY;  Surgeon: Mansouraty, Albino Alu., MD;  Location: [facility] ENDOSCOPY;  Service: Gastroenterology;  Laterality: N/A;   COLONOSCOPY WITH PROPOFOL N/A 11/25/2020   Procedure: COLONOSCOPY WITH PROPOFOL;  Surgeon: Daina Drum, MD;  Location: [facility] ENDOSCOPY;  Service: Gastroenterology;  Laterality: N/A;   COLONOSCOPY WITH  PROPOFOL N/A 09/23/2021   Procedure: COLONOSCOPY WITH PROPOFOL;  Surgeon: Annis Kinder, DO;  Location: [facility] ENDOSCOPY;  Service: Gastroenterology;  Laterality: N/A;   ENTEROSCOPY N/A 09/23/2021   Procedure: ENTEROSCOPY;  Surgeon: Annis Kinder, DO;  Location: [facility] ENDOSCOPY;  Service: Gastroenterology;  Laterality: N/A;  Will place order for video capsule study as we may opt to place it during procedure   ESOPHAGOGASTRODUODENOSCOPY N/A 08/01/2019   Procedure: ESOPHAGOGASTRODUODENOSCOPY (EGD);  Surgeon: Tami Falcon, MD;  Location: [facility] ENDOSCOPY;  Service: Endoscopy;  Laterality: N/A;   ESOPHAGOGASTRODUODENOSCOPY N/A 11/18/2020   Procedure: ESOPHAGOGASTRODUODENOSCOPY (EGD);  Surgeon: Elois Hair, MD;  Location: [facility] ENDOSCOPY;  Service: Gastroenterology;  Laterality: N/A;   ESOPHAGOGASTRODUODENOSCOPY (EGD) WITH PROPOFOL N/A 08/04/2019   Procedure: ESOPHAGOGASTRODUODENOSCOPY (EGD) WITH PROPOFOL;  Surgeon: Ace Holder, MD;  Location: [facility] ENDOSCOPY;  Service: Gastroenterology;  Laterality: N/A;   ESOPHAGOGASTRODUODENOSCOPY (EGD) WITH PROPOFOL N/A 09/13/2021   Procedure: ESOPHAGOGASTRODUODENOSCOPY (EGD) WITH PROPOFOL;  Surgeon: Brice Campi Albino Alu., MD;  Location: [facility] ENDOSCOPY;  Service: Gastroenterology;  Laterality: N/A;   GIVENS CAPSULE STUDY N/A 09/23/2021   Procedure: GIVENS CAPSULE STUDY;  Surgeon: Annis Kinder, DO;  Location: [facility] ENDOSCOPY;  Service: Gastroenterology;  Laterality: N/A;   graft left arm Left    for dialysis x 2. Removed   HOT HEMOSTASIS  11/18/2020   Procedure: HOT HEMOSTASIS (ARGON PLASMA COAGULATION/BICAP);  Surgeon: Elois Hair, MD;  Location: [facility] ENDOSCOPY;  Service: Gastroenterology;;   HOT HEMOSTASIS N/A 09/23/2021   Procedure: HOT HEMOSTASIS (ARGON PLASMA COAGULATION/BICAP);  Surgeon: Annis Kinder, DO;  Location: [facility] ENDOSCOPY;  Service: Gastroenterology;  Laterality: N/A;   INSERTION OF DIALYSIS CATHETER     Rt chest   LAPAROSCOPIC RIGHT  COLECTOMY N/A 08/05/2015   Procedure: LAPAROSCOPIC RIGHT COLECTOMY- ASCENDING;  Surgeon: Lockie Rima, MD;  Location: [facility] OR;  Service: General;  Laterality: N/A;   MASS EXCISION Left 05/28/2019   Procedure: EXCISION SOFT TISSUE MASS LEFT SHOULDER;  Surgeon: Oralee Billow, MD;  Location: [facility] ORS;  Service: General;  Laterality: Left;   NEPHRECTOMY Bilateral    PARATHYROIDECTOMY N/A 06/12/2016   Procedure: TOTAL PARATHYROIDECTOMY WITH AUTOTRANSPLANTATION TO LEFT FOREARM;  Surgeon: Oralee Billow, MD;  Location: [facility] OR;  Service: General;  Laterality: N/A;   PARATHYROIDECTOMY N/A 10/23/2016  Procedure: PARATHYROIDECTOMY;  Surgeon: Oralee Billow, MD;  Location: [facility] OR;  Service: General;  Laterality: N/A;   REVERSE SHOULDER ARTHROPLASTY Right 08/24/2020   Procedure: REVERSE SHOULDER ARTHROPLASTY;  Surgeon: Micheline Ahr, MD;  Location: [facility] OR;  Service: Orthopedics;  Laterality: Right;   REVISON OF ARTERIOVENOUS FISTULA Right 07/16/2017   Procedure: REVISION OF ARTERIOVENOUS FISTULA  Right ARM;  Surgeon: Adine Hoof, MD;  Location: [facility] OR;  Service: Vascular;  Laterality: Right;   TOTAL HIP ARTHROPLASTY Left 11/14/2020   Procedure: LEFT TOTAL HIP ARTHROPLASTY ANTERIOR APPROACH;  Surgeon: Wes Hamman, MD;  Location: [facility] OR;  Service: Orthopedics;  Laterality: Left;   UPPER GASTROINTESTINAL ENDOSCOPY  2021   @ [facility]     reports that he quit smoking about 32 years ago. His smoking use included cigarettes. He has never used smokeless tobacco. He reports that he does not currently use alcohol after a past usage of about 1.0 standard drink of alcohol per week. He reports that he does not currently use drugs after having used the following drugs: Marijuana.  Allergies  Allergen Reactions   Infed [Iron Dextran] Other (See Comments)    Decreased BP    Vicodin [Hydrocodone-Acetaminophen] Other (See Comments)    Decreased BP   Diclofenac Other (See Comments)    Caused hypotension    Family History   Problem Relation Age of Onset   Diabetes Father    Stroke Father    Hypertension Father    Uterine cancer Mother    Lupus Sister    Stroke Sister    Hypertension Sister    Anuerysm Brother        brain   Colon cancer Neg Hx    Esophageal cancer Neg Hx    Stomach cancer Neg Hx    Pancreatic cancer Neg Hx    Liver disease Neg Hx    Colon polyps Neg Hx    Rectal cancer Neg Hx     Prior to Admission medications   Medication Sig Start Date End Date Taking? Authorizing Provider  acetaminophen (TYLENOL) 650 MG CR tablet Take 1,300 mg by mouth every 8 (eight) hours as needed for pain.    [provider]  acetaminophen-codeine (TYLENOL #3) 300-30 MG tablet Take 1 tablet by mouth every 6 (six) hours as needed for moderate pain (pain score 4-6). 05/08/23   Jasmine Mesi, MD  amoxicillin (AMOXIL) 500 MG capsule Take 4 pills one hour prior to dental work Patient not taking: Reported on 05/06/2023 05/04/21   Sandie Cross, PA-C  atorvastatin (LIPITOR) 10 MG tablet Take 10 mg by mouth every Thursday.    [provider]  augmented betamethasone dipropionate (DIPROLENE-AF) 0.05 % ointment Apply 1 Application topically 2 (two) times daily as needed for rash. 08/28/21   [provider]  cinacalcet (SENSIPAR) 60 MG tablet Take 120 mg by mouth daily with supper.    [provider]  ciprofloxacin-dexamethasone (CIPRODEX) OTIC suspension Place 4 drops into the left ear daily.    [provider]  cromolyn (OPTICROM) 4 % ophthalmic solution Place 1 drop into both eyes daily as needed (redness).    [provider]  Docusate Sodium (DSS) 100 MG CAPS Take 100 mg by mouth daily as needed (constipation.).    [provider]  folic acid (FOLVITE) 1 MG tablet Take 1 tablet (1 mg total) by mouth daily. 09/29/21   Singh, Prashant K, MD  gabapentin (NEURONTIN) 100 MG capsule Take 300 mg  by mouth at bedtime. 05/15/21   [provider]  LOKELMA  10 g PACK packet Take 1 packet by mouth daily. 05/16/21   [provider]  multivitamin (RENA-VIT) TABS tablet Take 1 tablet by mouth daily.    [provider]  oxyCODONE-acetaminophen (PERCOCET/ROXICET) 5-325 MG tablet Take 1-2 tablets by mouth every 6 (six) hours as needed. Patient not taking: Reported on 05/06/2023 02/03/21   [provider]  pantoprazole (PROTONIX) 40 MG tablet Take 1 tablet (40 mg total) by mouth 2 (two) times daily before a meal. 09/13/21   Singh, Prashant K, MD  sevelamer carbonate (RENVELA) 800 MG tablet Take 1,600-4,000 mg by mouth See admin instructions. Take 2-3 tablets (1600 mg-2400 mg) by mouth with each snack & take 4-5 tablets (3200 mg-4000 mg) by mouth with meals 06/12/21   [provider]  sucralfate (CARAFATE) 1 GM/10ML suspension Take 10 mLs (1 g total) by mouth 2 (two) times daily. Patient taking differently: Take 1 g by mouth 2 (two) times daily as needed (indigestion/upset stomach.). 09/13/21   Singh, Prashant K, MD  traMADol (ULTRAM) 50 MG tablet TAKE 1 TABLET BY MOUTH EVERY 12 HOURS AS NEEDED 04/15/23   Sandie Cross, PA-C  vitamin B-12 (CYANOCOBALAMIN) 1000 MCG tablet Take 1,000 mcg by mouth 2 (two) times a week.    [provider]  vitamin E 180 MG (400 UNITS) capsule Take 400 Units by mouth 3 (three) times a week.    [provider]     Physical Exam: Vitals:   05/25/23 1817 05/25/23 1821 05/25/23 2232  BP: (!) 150/81  (!) 183/99  Pulse: 86  90  Resp: 17  12  Temp: 98.3 F (36.8 C)    SpO2: 100%  100%  Weight:  86.1 kg   Height:  6\' 1"  (1.854 m)     Physical Exam Vitals and nursing note reviewed.  Constitutional:      General: He is not in acute distress.    Appearance: He is ill-appearing.  HENT:     Mouth/Throat:     Mouth: Mucous membranes are moist.  Eyes:     Pupils: Pupils are equal, round, and reactive to light.  Cardiovascular:     Rate and Rhythm: Normal rate and regular rhythm.      Pulses: Normal pulses.     Heart sounds: Normal heart sounds.  Pulmonary:     Effort: Pulmonary effort is normal.     Breath sounds: Normal breath sounds.  Musculoskeletal:        General: No swelling.     Cervical back: Normal range of motion and neck supple.     Right lower leg: No edema.     Left lower leg: No edema.  Skin:    Capillary Refill: Capillary refill takes less than 2 seconds.  Neurological:     Mental Status: He is alert and oriented to person, place, and time.     Cranial Nerves: Cranial nerve deficit present.     Sensory: No sensory deficit.     Motor: Weakness present.  Psychiatric:        Mood and Affect: Mood normal.        Thought Content: Thought content normal.        Judgment: Judgment normal.      Labs on Admission: I have personally reviewed following labs and imaging studies  CBC: Recent Labs  Lab 05/25/23 1830 05/25/23 1933  WBC 6.4  --   NEUTROABS  4.4  --   HGB 9.9* 10.5*  HCT 31.8* 31.0*  MCV 92.2  --   PLT 203  --    Basic Metabolic Panel: Recent Labs  Lab 05/25/23 1830 05/25/23 1933  NA 140 140  K 3.8 3.8  CL 96* 97*  CO2 32  --   GLUCOSE 107* 89  BUN 35* 36*  CREATININE 7.18* 7.60*  CALCIUM 9.8  --    GFR: Estimated Creatinine Clearance: 11.1 mL/min (A) (by C-G formula based on SCr of 7.6 mg/dL (H)). Liver Function Tests: Recent Labs  Lab 05/25/23 1830  AST 21  ALT 13  ALKPHOS 70  BILITOT 0.6  PROT 7.4  ALBUMIN 3.2*   No results for input(s): "LIPASE", "AMYLASE" in the last 168 hours. No results for input(s): "AMMONIA" in the last 168 hours. Coagulation Profile: Recent Labs  Lab 05/25/23 1830  INR 1.1   Cardiac Enzymes: No results for input(s): "CKTOTAL", "CKMB", "CKMBINDEX", "TROPONINI", "TROPONINIHS" in the last 168 hours. BNP (last 3 results) No results for input(s): "BNP" in the last 8760 hours. HbA1C: No results for input(s): "HGBA1C" in the last 72 hours. CBG: Recent Labs  Lab 05/25/23 1904   GLUCAP 81   Lipid Profile: No results for input(s): "CHOL", "HDL", "LDLCALC", "TRIG", "CHOLHDL", "LDLDIRECT" in the last 72 hours. Thyroid Function Tests: No results for input(s): "TSH", "T4TOTAL", "FREET4", "T3FREE", "THYROIDAB" in the last 72 hours. Anemia Panel: No results for input(s): "VITAMINB12", "FOLATE", "FERRITIN", "TIBC", "IRON", "RETICCTPCT" in the last 72 hours. Urine analysis:    Component Value Date/Time   COLORURINE YELLOW 08/06/2006 0806   APPEARANCEUR CLEAR 08/06/2006 0806   LABSPEC 1.013 08/06/2006 0806   PHURINE 8.0 08/06/2006 0806   GLUCOSEU 100 (A) 08/06/2006 0806   HGBUR MODERATE (A) 08/06/2006 0806   BILIRUBINUR NEGATIVE 08/06/2006 0806   KETONESUR NEGATIVE 08/06/2006 0806   PROTEINUR > 300 08/06/2006 0806   UROBILINOGEN 0.2 08/06/2006 0806   NITRITE NEGATIVE 08/06/2006 0806   LEUKOCYTESUR SMALL 08/06/2006 0806    Radiological Exams on Admission: I have personally reviewed images MR CERVICAL SPINE WO CONTRAST Result Date: 05/25/2023 CLINICAL DATA:  Initial evaluation for acute myelopathy. EXAM: MRI CERVICAL SPINE WITHOUT CONTRAST TECHNIQUE: Multiplanar, multisequence MR imaging of the cervical spine was performed. No intravenous contrast was administered. COMPARISON:  Prior CT from earlier the same day. FINDINGS: Alignment: Reversal of the normal cervical lordosis. No significant listhesis. Vertebrae: Evolving fracture involving the C4 vertebral body again seen, corresponding with abnormality on prior CT. Associated height loss measures up to 50% with 4 mm bony retropulsion. Extension through the posterior elements on the right. Fracture is pathologic in appearance, and suspected to be related underlying renal osteodystrophy as question on prior CT. Associated prominent reactive endplate changes about the C4-5 and C5-6 interspaces. Vertebral body height otherwise maintained. Decreased T1 signal intensity seen throughout the visualized bone marrow, suspected to be  related history of end-stage renal disease. No worrisome osseous lesions. No other abnormal marrow edema. Cord: Patchy signal abnormality seen involving the cervical cord extending from C3 through C6, consistent with edema and/or evolving myelomalacia (series 10, image 9). Finding is suspected to be posttraumatic in nature related to the adjacent fracture. Posterior Fossa, vertebral arteries, paraspinal tissues: Large left mastoid effusion. Few retention cysts noted at the nasopharynx. Craniocervical junction normal. Mild diffuse prevertebral edema related to the acute C4 fracture. Suspected injury of the longitudinal ligament at the level of C4. Remainder of the major ligamentous structures appear grossly intact. Normal  flow voids seen within the vertebral arteries bilaterally. Disc levels: C2-C3: Disc desiccation with mild disc bulge and uncovertebral spurring. Mild facet hypertrophy. Mild spinal stenosis. Foramina remain patent. C3-C4: Central to right paracentral disc osteophyte complex mildly flattens the ventral thecal sac. Superimposed right greater left facet hypertrophy with right-sided uncovertebral spurring. Moderate spinal stenosis. Moderate right C4 foraminal narrowing. Left neural foramina remains patent. C4-C5: Advance intervertebral disc space narrowing. 4 mm bony retropulsion related to the pathologic C4 fracture. Flattening and effacement of the ventral thecal sac. Secondary cord flattening with associated cord signal changes as above. Superimposed right-sided facet hypertrophy. Resultant severe spinal stenosis with the thecal sac measuring 5 mm in AP diameter at its most narrow point. Moderate bilateral C5 foraminal narrowing. C5-C6: Advanced intervertebral disc space narrowing with diffuse disc osteophyte complex. Flattening of the ventral thecal sac. Superimposed mild facet hypertrophy. Mild spinal stenosis. Moderate left C6 foraminal narrowing. Right neural foramen remains patent. C6-C7:  Degenerate intervertebral disc space narrowing with diffuse disc bulge and uncovertebral spurring. Mild facet hypertrophy. No significant spinal stenosis. Foramina remain patent. C7-T1: Disc desiccation without significant disc bulge. Mild facet hypertrophy. No significant stenosis. IMPRESSION: 1. Evolving C4 fracture with 50% height loss and 4 mm bony retropulsion, subacute in appearance, and likely pathologic due to underlying renal osteodystrophy as postulated on prior CT. Underlying infection could conceivably have this appearance as well, and could be considered in the correct clinical setting. 2. Resultant severe spinal stenosis at the level of C4. Patchy signal abnormality involving the cervical cord extending from C3 through C6, consistent with edema and/or evolving myelomalacia. 3. Multifactorial degenerative changes at C3-4 and C5-6 with resultant moderate spinal stenosis at both levels. Moderate right C4, bilateral C5, left C6 foraminal narrowing. Results were discussed by telephone at the time of image acquisition on 05/25/2023 at 8:40 p.m. to provider [facility] , who verbally acknowledged these results. Electronically Signed   By: Virgia Griffins M.D.   On: 05/25/2023 21:22   CT CERVICAL SPINE WO CONTRAST Result Date: 05/25/2023 CLINICAL DATA:  Initial evaluation for acute trauma, fall. EXAM: CT CERVICAL SPINE WITHOUT CONTRAST TECHNIQUE: Multidetector CT imaging of the cervical spine was performed without intravenous contrast. Multiplanar CT image reconstructions were also generated. RADIATION DOSE REDUCTION: This exam was performed according to the departmental dose-optimization program which includes automated exposure control, adjustment of the mA and/or kV according to patient size and/or use of iterative reconstruction technique. COMPARISON:  Prior MRI from 06/27/2006. FINDINGS: Alignment: Reversal of the normal cervical lordosis. Underlying cervicothoracic levoscoliosis. No  listhesis. Skull base and vertebrae: Bones are somewhat diffusely sclerotic in appearance, suggesting a degree of underlying renal osteodystrophy. Skull base intact. Normal C1-2 articulations are preserved. Dens is intact. There is an evolving fracture involving the C4 vertebral body. Associated height loss measures up to 50% with 4 mm bony retropulsion. Fracture is pathologic in subacute in appearance, suspected to be related underlying renal osteodystrophy. Extension through the right posterior elements and C4 facet (series 7, image 37). Vertebral body height otherwise maintained. No worrisome osseous lesions. Soft tissues and spinal canal: Mild diffuse prevertebral edema. Vascular calcifications noted about the carotid bifurcations. Postoperative changes noted about the thyroid. Disc levels:  Advanced degenerative spondylosis at C4-5 and C5-6. Upper chest: Visualized upper chest demonstrates no acute finding. Other: None. IMPRESSION: 1. Evolving subacute fracture involving the C4 vertebral body with up to 50% height loss and 4 mm bony retropulsion. Extension through the right posterior elements/articular facet of C4. 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 2. Underlying advanced degenerative spondylosis at C4-5 and C5-6. Critical Value/emergent results were discussed by telephone at the time of image acquisition on 05/25/2023 at approximately 7:20 p.m. to provider Dr. Murvin Arthurs , who verbally acknowledged these results. Electronically Signed   By: Virgia Griffins M.D.   On: 05/25/2023 21:01   MR BRAIN WO CONTRAST Result Date: 05/25/2023 CLINICAL DATA:  Initial evaluation for acute neuro deficit, stroke suspected. EXAM: MRI HEAD WITHOUT CONTRAST TECHNIQUE: Multiplanar, multiecho pulse sequences of the brain and surrounding structures were obtained without intravenous contrast. COMPARISON:  CT from earlier the same day. FINDINGS: Brain: Cerebral  volume within normal limits. Patchy T2/FLAIR hyperintensity involving the periventricular deep white matter both cerebral hemispheres, consistent chronic small vessel ischemic disease, mild in nature. Remote lacunar infarct present at the anterior left basal ganglia. Encephalomalacia and gliosis involving the left occipital lobe, consistent with a chronic left PCA distribution infarct. This corresponds with abnormality on prior CT. Mild chronic hemosiderin staining at this location. No evidence for acute or subacute ischemia. Gray-white matter differentiation maintained. No other areas of chronic cortical infarction. No other acute or chronic intracranial blood products. No mass lesion, midline shift or mass effect. No hydrocephalus or extra-axial fluid collection. Pituitary gland within normal limits. Vascular: Major intracranial vascular flow voids are well maintained. Skull and upper cervical spine: Craniocervical junction within normal limits. Decreased T1 signal intensity noted within the visualized bone marrow, nonspecific, but most commonly related to anemia, smoking or obesity. Probable evolving C4 fracture, better seen on corresponding MRI of the cervical spine. Soft tissue contusion at the left frontal scalp. Sinuses/Orbits: Prior bilateral ocular lens replacement. Paranasal sinuses are largely clear. Large left mastoid and middle ear effusion, with some fluid signal intensity within the left petrous apex. Few small retention cysts noted at the nasopharynx. Other: None. IMPRESSION: 1. No acute intracranial abnormality. 2. Small soft tissue contusion at the left frontal scalp. 3. Chronic left PCA distribution infarct. 4. Underlying mild chronic microvascular ischemic disease with remote lacunar infarct at the anterior left basal ganglia. 5. Probable evolving C4 fracture, better seen on corresponding MRI of the cervical spine. 6. Large left mastoid and middle ear effusion. Several small retention cyst  present at the nasopharynx, suggesting that this finding is postobstructive in nature. Electronically Signed   By: Virgia Griffins M.D.   On: 05/25/2023 20:51   CT HEAD CODE STROKE WO CONTRAST Result Date: 05/25/2023 CLINICAL DATA:  Code stroke. Initial evaluation for acute neuro deficit, stroke suspected. EXAM: CT HEAD WITHOUT CONTRAST TECHNIQUE: Contiguous axial images were obtained from the base of the skull through the vertex without intravenous contrast. RADIATION DOSE REDUCTION: This exam was performed according to the departmental dose-optimization program which includes automated exposure control, adjustment of the mA and/or kV according to patient size and/or use of iterative reconstruction technique. COMPARISON:  CT from 09/11/2021 FINDINGS: Brain: Extensive dural calcifications again noted. Focal hypodensity involving the left occipital lobe, consistent with a small acute left PCA distribution infarct (image 17). No convincing acute intracranial hemorrhage. Note made of a small apparent focal hyperdensity at the high left frontal convexity, seen on axial slice (series 2, image 24), not confidently seen on corresponding sequences, and favored to be artifactual. No other acute large vessel territory infarct. No mass lesion or midline shift. No hydrocephalus. No extra-axial fluid collection. Vascular: No abnormal hyperdense vessel. Scattered calcified atherosclerosis present at the skull base. Skull: Soft  tissue contusion present at the left frontal scalp near the vertex. Calvarium intact. Sinuses/Orbits: Globes orbital soft tissues within normal limits. Paranasal sinuses are largely clear. Large left mastoid and middle ear effusion. Mild asymmetric soft tissue fullness at the left nasopharynx without discrete mass (series 2, image 6). Other: None. ASPECTS [facility] Stroke Program Early CT Score) - Ganglionic level infarction (caudate, lentiform nuclei, internal capsule, insula, M1-M3 cortex): 7 -  Supraganglionic infarction (M4-M6 cortex): 3 Total score (0-10 with 10 being normal): 10 IMPRESSION: 1. Small acute left PCA distribution infarct. No acute intracranial hemorrhage. 2. Aspects equals 10. 3. Soft tissue contusion at the left frontal scalp. No calvarial fracture. No acute intracranial hemorrhage. 4. Underlying atrophy with chronic small vessel ischemic disease. Chronic lacunar infarct at the left basal ganglia. Case discussed by telephone at the time of interpretation on 05/25/2023 at 7:18 p.m. To provider Dr. Murvin Arthurs. Electronically Signed   By: Virgia Griffins M.D.   On: 05/25/2023 19:35     EKG: My personal interpretation of EKG shows: Normal sinus rhythm heart rate 65.  Left ventricular hypertrophy pattern.    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  Since the fall patient noticed bilateral upper and lower extremity weakness and heaviness.  Denies any syncope, presyncope and seizure.  Initially code stroke activated in the ED. - CT head, MRI brain no evidence of new stroke.  However MRI cervical spine showed C4 vertebral fracture and incomplete spinal cord injury.  Cervical collar has been placed in the ED. - Both neurology and neurosurgery have been consulted.  Neurosurgery Dr. Adonis Alamin planning to take to the OR tonight for C4 corpectomy with C3-C5 anterior cervical fusion utilizing interbody cage and local autograft coupled with anterior plate fixation.  -Keep patient n.p.o. - Holding any  antiplatelet and pharmacological anticoagulation as high risk of bleeding for neurosurgery procedure. -Maintain the cervical collar. - If patient remains vent dependent postsurgery need to reach out to ICU for assessment and admission under neuro ICU care. -Avoid narcotics.  Continue Tylenol 1000 mg every 8 hours as needed for moderate and severe pain.    ESRD on dialysis MWF schedule -Patient is on dialysis MWF schedule.  Currently no evidence of volume overload.  CMP no hyperkalemia.  There is no need for emergent dialysis tonight.  Patient reported last dialysis on 4/11. -Consult nephrology in the daytime for dialysis on Monday 4/14. -Continue Renvela, Sensipar.  Anemia of chronic disease - Stable H&H 9.9 and 31.  Continue to monitor CBC.  GERD History of GI bleed -Continue oral Protonix and sucralfate.  History of hypotension with dialysis History of essential hypertension -Per review patient used to be having hypotension with dialysis and previously on midodrine and not any blood pressure regimen. -Patient reported not currently on any blood pressure regimen at home.  Blood pressure elevated in the ED 183/99.  Continue IV hydralazine as needed  History of seizure -Not on any antiseizure medication currently.  History of CVA -Not on any antiplatelet therapy given history of GI bleed.  -  MRI brain showing old infarction, -Continue Lipitor.  History of obstructive sleep apnea -Continue check pulse ox   DVT prophylaxis:  SCDs.  Deferring pharmacological DVT prophylaxis as patient will go for neurosurgery procedure and high risk of bleeding. Code Status:  Full Code Diet: N.p.o. Family Communication:   Family was present at bedside, at the time of interview. Opportunity was given to ask question and all questions were answered satisfactorily.  Disposition Plan: Postoperatively patient remains vent dependent intact To transfer to neuro ICU care. Consults: Neurology and  neurosurgery Admission status:   Inpatient, Step Down Unit  Severity of Illness: The appropriate patient status for this patient is INPATIENT. Inpatient status is judged to be reasonable and necessary in order to provide the required intensity of service to ensure the patient's safety. The patient's presenting symptoms, physical exam findings, and initial radiographic and laboratory data in the context of their chronic comorbidities is felt to place them at high risk for further clinical deterioration. Furthermore, it is not anticipated that the patient will be medically stable for discharge from the hospital within 2 midnights of admission.   * I certify that at the point of admission it is my clinical judgment that the patient will require inpatient hospital care spanning beyond 2 midnights from the point of admission due to high intensity of service, high risk for further deterioration and high frequency of surveillance required.Aaron Aas    Mayrani Khamis, MD [facility]  How to contact the [facility] Attending or Consulting provider 7A - 7P or covering provider during after hours 7P -7A, for this patient.  Check the care team in [facility] and look for a) attending/consulting [facility] provider listed and b) the [facility] team listed Log into [email or web address] and use [place]'s universal password to access. If you do not have the password, please contact the hospital operator. Locate the [facility] provider you are looking for under [facility] and page to a number that you can be directly reached. If you still have difficulty reaching the provider, please page the [facility] (Director on Call) for the Hospitalists listed on amion for assistance.  05/25/2023, 11:43 PM

## 2023-05-25 NOTE — Anesthesia Preprocedure Evaluation (Addendum)
 Anesthesia Evaluation  Patient identified by MRN, date of birth, ID band Patient awake    Reviewed: Allergy & Precautions, NPO status , Patient's Chart, lab work & pertinent test results  Airway Mallampati: II  TM Distance: >3 FB Neck ROM: Full    Dental no notable dental hx. (+) Upper Dentures   Pulmonary sleep apnea , former smoker   Pulmonary exam normal        Cardiovascular hypertension, + Peripheral Vascular Disease   Rhythm:Regular Rate:Normal     Neuro/Psych  Headaches, Seizures -, Well Controlled,   Anxiety Depression    CVA, No Residual Symptoms    GI/Hepatic Neg liver ROS, PUD,GERD  Medicated,,  Endo/Other  diabetes    Renal/GU ESRF and DialysisRenal diseaseLab Results      Component                Value               Date                      WBC                      6.4                 05/25/2023                HGB                      10.5 (L)            05/25/2023                HCT                      31.0 (L)            05/25/2023                MCV                      92.2                05/25/2023                PLT                      203                 05/25/2023             Lab Results      Component                Value               Date                      NA                       140                 05/25/2023                K                        3.8  05/25/2023                CO2                      32                  05/25/2023                GLUCOSE                  89                  05/25/2023                BUN                      36 (H)              05/25/2023                CREATININE               7.60 (H)            05/25/2023                CALCIUM                  9.8                 05/25/2023                GFRNONAA                 8 (L)               05/25/2023             negative genitourinary   Musculoskeletal  (+) Arthritis , Osteoarthritis,  C4  burst fracture with cord compression   Abdominal Normal abdominal exam  (+)   Peds  Hematology  (+) Blood dyscrasia, anemia , REFUSES BLOOD PRODUCTS, JEHOVAH'S WITNESSLab Results      Component                Value               Date                      WBC                      6.4                 05/25/2023                HGB                      10.5 (L)            05/25/2023                HCT                      31.0 (L)            05/25/2023                MCV                      92.2  05/25/2023                PLT                      203                 05/25/2023              Anesthesia Other Findings   Reproductive/Obstetrics                             Anesthesia Physical Anesthesia Plan  ASA: 3 and emergent  Anesthesia Plan: General   Post-op Pain Management:    Induction: Intravenous  PONV Risk Score and Plan: 2 and Ondansetron, Dexamethasone and Treatment may vary due to age or medical condition  Airway Management Planned: Mask, Oral ETT and Video Laryngoscope Planned  Additional Equipment: None  Intra-op Plan:   Post-operative Plan: Extubation in OR  Informed Consent: I have reviewed the patients History and Physical, chart, labs and discussed the procedure including the risks, benefits and alternatives for the proposed anesthesia with the patient or authorized representative who has indicated his/her understanding and acceptance.     Dental advisory given  Plan Discussed with:   Anesthesia Plan Comments:        Anesthesia Quick Evaluation

## 2023-05-25 NOTE — ED Provider Notes (Addendum)
 [place] EMERGENCY DEPARTMENT AT [facility] Provider Note   CSN: [id number] Arrival date & time: 05/25/23  1811  An emergency department physician performed an initial assessment on this suspected stroke patient at 1902.  History Chief Complaint  Patient presents with  . Fall    HPI Carl Dachel Sr. is a 63 y.o. male presenting for chief complaint ground-level fall, weakness, malaise. He is a 63 year old male with an expansive medical history.  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  However he states that today while he was walking to the car he became suddenly weak and collapsed falling forward. Comes in with inability to move his legs. During my conversation I activated him as a code stroke due to bilateral lower extremity symptoms prior to the fall with worsening after the fall.. Neurology came to bedside and recommended a simultaneous trauma activation. MHX: Recurrent GIB, Epilepsy. PRES, TIA, ESRD, DM, RCC, Ischemic colitis Patient's recorded medical, surgical, social, medication list and allergies were reviewed in the Snapshot window as part of the initial history.   Review of Systems   Review of Systems  Constitutional:  Negative for chills and fever.  HENT:  Negative for ear pain and sore throat.   Eyes:  Negative for pain and visual disturbance.  Respiratory:  Negative for cough and shortness of breath.   Cardiovascular:  Negative for chest pain and palpitations.  Gastrointestinal:  Negative for abdominal pain and vomiting.  Genitourinary:  Negative for dysuria and hematuria.  Musculoskeletal:  Negative for arthralgias and back pain.  Skin:  Negative for color change and rash.  Neurological:  Positive for weakness and numbness. Negative for seizures and syncope.  All other systems reviewed and are negative.   Physical Exam Updated Vital Signs BP (!) 183/99   Pulse 90   Temp 98.3 F (36.8 C)   Resp 12   Ht 6\' 1"  (1.854 m)   Wt 86.1 kg   SpO2 100%    BMI 25.04 kg/m  Physical Exam Vitals and nursing note reviewed.  Constitutional:      General: He is not in acute distress.    Appearance: He is well-developed.  HENT:     Head: Normocephalic and atraumatic.  Eyes:     Conjunctiva/sclera: Conjunctivae normal.  Cardiovascular:     Rate and Rhythm: Normal rate and regular rhythm.     Heart sounds: No murmur heard. Pulmonary:     Effort: Pulmonary effort is normal. No respiratory distress.     Breath sounds: Normal breath sounds.  Abdominal:     Palpations: Abdomen is soft.     Tenderness: There is no abdominal tenderness.  Musculoskeletal:        General: Deformity and signs of injury present. No swelling.     Cervical back: Neck supple. Tenderness present.  Skin:    General: Skin is warm and dry.     Capillary Refill: Capillary refill takes less than 2 seconds.  Neurological:     Mental Status: He is alert.  Psychiatric:        Mood and Affect: Mood normal.     ED Course/ Medical Decision Making/ A&P    Procedures .Critical Care  Performed by: Onetha Bile, MD Authorized by: Onetha Bile, MD   Critical care provider statement:    Critical care time (minutes):  90   Critical care was necessary to treat or prevent imminent or life-threatening deterioration of the following conditions:  CNS failure or compromise and trauma  Critical care was time spent personally by me on the following activities:  Development of treatment plan with patient or surrogate, discussions with consultants, evaluation of patient's response to treatment, examination of patient, ordering and review of laboratory studies, ordering and review of radiographic studies, ordering and performing treatments and interventions, pulse oximetry, re-evaluation of patient's condition and review of old charts   Care discussed with: admitting provider   .Ultrasound ED Peripheral IV (Provider)  Date/Time: 05/25/2023 10:37 PM  Performed by: Onetha Bile, MD Authorized by: Onetha Bile, MD   Procedure details:    Indications: multiple failed IV attempts and poor IV access     Skin Prep: chlorhexidine  gluconate     Location:  Right AC   Angiocath:  20 G   Bedside Ultrasound Guided: Yes     Images: archived     Patient tolerated procedure without complications: Yes     Dressing applied: Yes      Medications Ordered in ED Medications - No data to display  Medical Decision Making:   This was a multifactorial evaluation. Concerning his neurologic symptoms, he was activated as a code stroke for immediate neurologic eval.  CT head showed possible infarct though MRI brain did not show any acute infarct.  Patient has history of severe bleeding events and with recent trauma he would not be a candidate for lytics. Neurology to follow  As part of the trauma eval he got a CT neck and a follow-up MRI cervical.  This showed spinal cord effacement.  Given the findings of a fracture in this effacement, I consulted neurosurgery who came and evaluated the patient at bedside. They recommended admission for stabilization and for care planning in the setting of his multifactorial disease and stated that they would plan to treat the spinal cord involvement as a corpectomy tomorrow n.p.o. at midnight.  Disposition:   Based on the above findings, I believe this patient is stable for admission.    Patient/family educated about specific findings on our evaluation and explained exact reasons for admission.  Patient/family educated about clinical situation and time was allowed to answer questions.   Admission team communicated with and agreed with need for admission. Patient admitted. Patient ready to move at this time.     Emergency Department Medication Summary:   Medications - No data to display       Clinical Impression:  1. Fall, initial encounter   2. Closed nondisplaced fracture of fourth cervical vertebra, unspecified fracture  morphology, initial encounter (HCC)      Data Unavailable   Final Clinical Impression(s) / ED Diagnoses Final diagnoses:  Fall, initial encounter  Closed nondisplaced fracture of fourth cervical vertebra, unspecified fracture morphology, initial encounter [facility])    Rx / DC Orders ED Discharge Orders     None         Onetha Bile, MD 05/25/23 2313    Onetha Bile, MD 06/10/23 1545

## 2023-05-25 NOTE — Consult Note (Addendum)
 NEUROLOGY CONSULT NOTE   Date of service: May 25, 2023 Patient Name: Marilyn Wing Sr. MRN:  [id number] DOB:  [birth year] Chief Complaint: "Code stroke, all extremity weakness" Requesting Provider: Glyn Ade, MD  History of Present Illness  Roddie Riegler Sr. is a 63 y.o. male with hx of gastric ulcers and GI bleed requiring blood transfusions in the past, GERD, OSA, seizures, HTN, prior lacunar strokes who was sitting on his truck/trailer bed, got up and took 3 steps and his knees gave out and face planted into floor. He was unable to get up.  Reports that his legs are heavy and can't lift them up. His arms are weaker. Son drove him to the ED and he was activated as a code stroke in the ED.  He has a bump on his left forehead. CT Head shows an acute appearing left PCA stroke. CT C spine with C4 fracture that appears chronic.  LKW: 1455 Modified rankin score: 1-No significant post stroke disability and can perform usual duties with stroke symptoms IV Thrombolysis: not offered, concern for concussion, concern for potential C spine injury. Weakness pattern fits in clinically more with a Cervical cord lesion rather than a stroke. Prior hx of peptic ulcer disease with GI bleed requiring blood transfusion. EVT: not offered due to no stroke.  NIHSS components Score: Comment  1a Level of Conscious 0[x]  1[]  2[]  3[]      1b LOC Questions 0[x]  1[]  2[]       1c LOC Commands 0[x]  1[]  2[]       2 Best Gaze 0[x]  1[]  2[]       3 Visual 0[x]  1[]  2[]  3[]      4 Facial Palsy 0[x]  1[]  2[]  3[]      5a Motor Arm - left 0[]  1[x]  2[]  3[]  4[]  UN[]    5b Motor Arm - Right 0[]  1[]  2[x]  3[]  4[]  UN[]    6a Motor Leg - Left 0[]  1[]  2[]  3[x]  4[]  UN[]    6b Motor Leg - Right 0[]  1[]  2[]  3[x]  4[]  UN[]    7 Limb Ataxia 0[x]  1[]  2[]  3[]  UN[]     8 Sensory 0[]  1[]  2[x]  UN[]      9 Best Language 0[x]  1[]  2[]  3[]      10 Dysarthria 0[x]  1[]  2[]  UN[]      11 Extinct. and Inattention 0[x]  1[]  2[]       TOTAL: 9      ROS   Comprehensive ROS performed and pertinent positives documented in HPI   Past History   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    Parathyroid abnormality (HCC)    ectopic parathyroid gland   Presence of arteriovenous fistula for hemodialysis, primary (HCC)    RUE PER PT RLE   Refusal of blood product    NO WHOLE BLOOD PROUCTS  Renal cell carcinoma (HCC)    s/p hand assisted laparoscopic bilateral nephrectomies 11/29/17, + RCC left   Secondary hyperparathyroidism (HCC)    Seizures (HCC)    one episode in past, due to" elevated Potassium" 08/02/20- "at least 4 years ago"   Sleep apnea    doesn't use CPAP anymore since weight loss   Stroke [facility])    no residual    Past Surgical History:  Procedure Laterality Date   AV FISTULA PLACEMENT Right    right arm   BIOPSY  08/01/2019   Procedure: BIOPSY;  Surgeon: Tami Falcon, MD;  Location: [facility] ENDOSCOPY;  Service: Endoscopy;;   BIOPSY  11/18/2020   Procedure: BIOPSY;  Surgeon: Elois Hair, MD;  Location: [facility] ENDOSCOPY;  Service: Gastroenterology;;   BIOPSY  09/13/2021   Procedure: BIOPSY;  Surgeon: Normie Becton., MD;  Location: [facility] ENDOSCOPY;  Service: Gastroenterology;;   CATARACT EXTRACTION W/ INTRAOCULAR LENS  IMPLANT, BILATERAL     COLON SURGERY     COLONOSCOPY N/A 08/04/2015   Procedure: COLONOSCOPY;  Surgeon: Nannette Babe, MD;  Location: [facility] ENDOSCOPY;  Service: Endoscopy;  Laterality: N/A;    COLONOSCOPY  2017   JMP@ [facility]-good prep-mass -recall 1 yr   COLONOSCOPY N/A 09/13/2021   Procedure: COLONOSCOPY;  Surgeon: Mansouraty, Albino Alu., MD;  Location: [facility] ENDOSCOPY;  Service: Gastroenterology;  Laterality: N/A;   COLONOSCOPY WITH PROPOFOL N/A 11/25/2020   Procedure: COLONOSCOPY WITH PROPOFOL;  Surgeon: Daina Drum, MD;  Location: [facility] ENDOSCOPY;  Service: Gastroenterology;  Laterality: N/A;   COLONOSCOPY WITH PROPOFOL N/A 09/23/2021   Procedure: COLONOSCOPY WITH PROPOFOL;  Surgeon: Annis Kinder, DO;  Location: [facility] ENDOSCOPY;  Service: Gastroenterology;  Laterality: N/A;   ENTEROSCOPY N/A 09/23/2021   Procedure: ENTEROSCOPY;  Surgeon: Annis Kinder, DO;  Location: [facility] ENDOSCOPY;  Service: Gastroenterology;  Laterality: N/A;  Will place order for video capsule study as we may opt to place it during procedure   ESOPHAGOGASTRODUODENOSCOPY N/A 08/01/2019   Procedure: ESOPHAGOGASTRODUODENOSCOPY (EGD);  Surgeon: Tami Falcon, MD;  Location: [facility] ENDOSCOPY;  Service: Endoscopy;  Laterality: N/A;   ESOPHAGOGASTRODUODENOSCOPY N/A 11/18/2020   Procedure: ESOPHAGOGASTRODUODENOSCOPY (EGD);  Surgeon: Elois Hair, MD;  Location: [facility] ENDOSCOPY;  Service: Gastroenterology;  Laterality: N/A;   ESOPHAGOGASTRODUODENOSCOPY (EGD) WITH PROPOFOL N/A 08/04/2019   Procedure: ESOPHAGOGASTRODUODENOSCOPY (EGD) WITH PROPOFOL;  Surgeon: Ace Holder, MD;  Location: [facility] ENDOSCOPY;  Service: Gastroenterology;  Laterality: N/A;   ESOPHAGOGASTRODUODENOSCOPY (EGD) WITH PROPOFOL N/A 09/13/2021   Procedure: ESOPHAGOGASTRODUODENOSCOPY (EGD) WITH PROPOFOL;  Surgeon: Brice Campi Albino Alu., MD;  Location: [facility] ENDOSCOPY;  Service: Gastroenterology;  Laterality: N/A;   GIVENS CAPSULE STUDY N/A 09/23/2021   Procedure: GIVENS CAPSULE STUDY;  Surgeon: Annis Kinder, DO;  Location: [facility] ENDOSCOPY;  Service: Gastroenterology;  Laterality: N/A;   graft left arm Left    for dialysis x 2. Removed   HOT HEMOSTASIS   11/18/2020   Procedure: HOT HEMOSTASIS (ARGON PLASMA COAGULATION/BICAP);  Surgeon: Elois Hair, MD;  Location: [facility] ENDOSCOPY;  Service: Gastroenterology;;   HOT HEMOSTASIS N/A 09/23/2021   Procedure: HOT HEMOSTASIS (ARGON PLASMA COAGULATION/BICAP);  Surgeon: Annis Kinder, DO;  Location: [facility] ENDOSCOPY;  Service: Gastroenterology;  Laterality: N/A;   INSERTION OF DIALYSIS CATHETER     Rt chest   LAPAROSCOPIC RIGHT COLECTOMY N/A 08/05/2015   Procedure: LAPAROSCOPIC RIGHT COLECTOMY- ASCENDING;  Surgeon: Lockie Rima, MD;  Location: [facility] OR;  Service: General;  Laterality: N/A;   MASS EXCISION Left 05/28/2019   Procedure: EXCISION  SOFT TISSUE MASS LEFT SHOULDER;  Surgeon: Oralee Billow, MD;  Location: [facility] ORS;  Service: General;  Laterality: Left;   NEPHRECTOMY Bilateral    PARATHYROIDECTOMY N/A 06/12/2016   Procedure: TOTAL PARATHYROIDECTOMY WITH AUTOTRANSPLANTATION TO LEFT FOREARM;  Surgeon: Oralee Billow, MD;  Location: [facility] OR;  Service: General;  Laterality: N/A;   PARATHYROIDECTOMY N/A 10/23/2016   Procedure: PARATHYROIDECTOMY;  Surgeon: Oralee Billow, MD;  Location: [facility] OR;  Service: General;  Laterality: N/A;   REVERSE SHOULDER ARTHROPLASTY Right 08/24/2020   Procedure: REVERSE SHOULDER ARTHROPLASTY;  Surgeon: Micheline Ahr, MD;  Location: [facility] OR;  Service: Orthopedics;  Laterality: Right;   REVISON OF ARTERIOVENOUS FISTULA Right 07/16/2017   Procedure: REVISION OF ARTERIOVENOUS FISTULA  Right ARM;  Surgeon: Adine Hoof, MD;  Location: [facility] OR;  Service: Vascular;  Laterality: Right;   TOTAL HIP ARTHROPLASTY Left 11/14/2020   Procedure: LEFT TOTAL HIP ARTHROPLASTY ANTERIOR APPROACH;  Surgeon: Wes Hamman, MD;  Location: [facility] OR;  Service: Orthopedics;  Laterality: Left;   UPPER GASTROINTESTINAL ENDOSCOPY  2021   @ [facility]    Family History: Family History  Problem Relation Age of Onset   Diabetes Father    Stroke Father    Hypertension Father    Uterine cancer Mother     Lupus Sister    Stroke Sister    Hypertension Sister    Anuerysm Brother        brain   Colon cancer Neg Hx    Esophageal cancer Neg Hx    Stomach cancer Neg Hx    Pancreatic cancer Neg Hx    Liver disease Neg Hx    Colon polyps Neg Hx    Rectal cancer Neg Hx     Social History  reports that he quit smoking about 32 years ago. His smoking use included cigarettes. He has never used smokeless tobacco. He reports that he does not currently use alcohol after a past usage of about 1.0 standard drink of alcohol per week. He reports that he does not currently use drugs after having used the following drugs: Marijuana.  Allergies  Allergen Reactions   Infed [Iron Dextran] Other (See Comments)    Decreased BP    Oxycodone Nausea Only   Vicodin [Hydrocodone-Acetaminophen] Other (See Comments)    Decreased BP   Diclofenac Other (See Comments)    Unknown    Medications  No current facility-administered medications for this encounter.  Current Outpatient Medications:    acetaminophen (TYLENOL) 650 MG CR tablet, Take 1,300 mg by mouth every 8 (eight) hours as needed for pain., Disp: , Rfl:    acetaminophen-codeine (TYLENOL #3) 300-30 MG tablet, Take 1 tablet by mouth every 6 (six) hours as needed for moderate pain (pain score 4-6)., Disp: 30 tablet, Rfl: 0   amoxicillin (AMOXIL) 500 MG capsule, Take 4 pills one hour prior to dental work (Patient not taking: Reported on 05/06/2023), Disp: 12 capsule, Rfl: 3   atorvastatin (LIPITOR) 10 MG tablet, Take 10 mg by mouth every Thursday., Disp: , Rfl:    augmented betamethasone dipropionate (DIPROLENE-AF) 0.05 % ointment, Apply 1 Application topically 2 (two) times daily as needed for rash., Disp: , Rfl:    cinacalcet (SENSIPAR) 60 MG tablet, Take 120 mg by mouth daily with supper., Disp: , Rfl:    ciprofloxacin-dexamethasone (CIPRODEX) OTIC suspension, Place 4 drops into the left ear daily., Disp: , Rfl:    cromolyn (OPTICROM) 4 % ophthalmic  solution, Place 1 drop into both eyes  daily as needed (redness)., Disp: , Rfl:    Docusate Sodium (DSS) 100 MG CAPS, Take 100 mg by mouth daily as needed (constipation.)., Disp: , Rfl:    folic acid (FOLVITE) 1 MG tablet, Take 1 tablet (1 mg total) by mouth daily., Disp: 30 tablet, Rfl: 0   gabapentin (NEURONTIN) 100 MG capsule, Take 300 mg by mouth at bedtime., Disp: , Rfl:    LOKELMA 10 g PACK packet, Take 1 packet by mouth daily., Disp: , Rfl:    multivitamin (RENA-VIT) TABS tablet, Take 1 tablet by mouth daily., Disp: , Rfl:    oxyCODONE-acetaminophen (PERCOCET/ROXICET) 5-325 MG tablet, Take 1-2 tablets by mouth every 6 (six) hours as needed. (Patient not taking: Reported on 05/06/2023), Disp: , Rfl:    pantoprazole (PROTONIX) 40 MG tablet, Take 1 tablet (40 mg total) by mouth 2 (two) times daily before a meal., Disp: 60 tablet, Rfl: 1   sevelamer carbonate (RENVELA) 800 MG tablet, Take 1,600-4,000 mg by mouth See admin instructions. Take 2-3 tablets (1600 mg-2400 mg) by mouth with each snack & take 4-5 tablets (3200 mg-4000 mg) by mouth with meals, Disp: , Rfl:    sucralfate (CARAFATE) 1 GM/10ML suspension, Take 10 mLs (1 g total) by mouth 2 (two) times daily. (Patient taking differently: Take 1 g by mouth 2 (two) times daily as needed (indigestion/upset stomach.).), Disp: 420 mL, Rfl: 0   traMADol (ULTRAM) 50 MG tablet, TAKE 1 TABLET BY MOUTH EVERY 12 HOURS AS NEEDED, Disp: 30 tablet, Rfl: 0   vitamin B-12 (CYANOCOBALAMIN) 1000 MCG tablet, Take 1,000 mcg by mouth 2 (two) times a week., Disp: , Rfl:    vitamin E 180 MG (400 UNITS) capsule, Take 400 Units by mouth 3 (three) times a week., Disp: , Rfl:   Vitals   Vitals:   05/25/23 1817 05/25/23 1821  BP: (!) 150/81   Pulse: 86   Resp: 17   Temp: 98.3 F (36.8 C)   SpO2: 100%   Weight:  86.1 kg  Height:  6\' 1"  (1.854 m)    Body mass index is 25.04 kg/m.  Physical Exam   General: Laying comfortably in bed; in no acute distress.   HENT: Normal oropharynx and mucosa. Normal external appearance of ears and nose.  Neck: Supple, no pain or tenderness  CV: No JVD. No peripheral edema.  Pulmonary: Symmetric Chest rise. Normal respiratory effort.  Abdomen: Soft to touch, non-tender.  Ext: No cyanosis, edema, or deformity  Skin: No rash. Normal palpation of skin.   Musculoskeletal: Normal digits and nails by inspection. No clubbing.   Neurologic Examination  Mental status/Cognition: Alert, oriented to self, place, month and year, good attention.  Speech/language: Fluent, comprehension intact, object naming intact, repetition intact.  Cranial nerves:   CN II Pupils equal and reactive to light, no VF deficits    CN III,IV,VI EOM intact, no gaze preference or deviation, no nystagmus    CN V normal sensation in V1, V2, and V3 segments bilaterally    CN VII no asymmetry, no nasolabial fold flattening    CN VIII normal hearing to speech    CN IX & X normal palatal elevation, no uvular deviation    CN XI Not assessed, patient being placed in C collar due to concern for potential C spine injury   CN XII midline tongue protrusion    Motor:  Muscle bulk: normal, tone normal Mvmt Root Nerve  Muscle Right Left Comments  SA C5/6 Ax Deltoid 2  3   EF C5/6 Mc Biceps 2 3   EE C6/7/8 Rad Triceps 2 2   WF C6/7 Med FCR     WE C7/8 PIN ECU     F Ab C8/T1 U ADM/FDI 2 3   HF L1/2/3 Fem Illopsoas 1 1   KE L2/3/4 Fem Quad 1 1   DF L4/5 D Peron Tib Ant 2 2   PF S1/2 Tibial Grc/Sol 2 2    Reflexes:  Right Left Comments  Pectoralis      Biceps (C5/6)     Brachioradialis (C5/6)      Triceps (C6/7)      Patellar (L3/4)      Achilles (S1)      Hoffman      Plantar     Jaw jerk    Sensation:  Light touch Decreased to touch in all extremities from neck below.   Pin prick    Temperature    Vibration   Proprioception    Coordination/Complex Motor:  - Finger to Nose unable to assess due to weakness, no obvious ataxia - Heel to  shin unable to assess due to no weakness. - Rapid alternating movement are slowed on the left, unable to do with RUE. - Gait: deferred for patient safety.  Labs/Imaging/Neurodiagnostic studies   CBC: No results for input(s): "WBC", "NEUTROABS", "HGB", "HCT", "MCV", "PLT" in the last 168 hours. Basic Metabolic Panel:  Lab Results  Component Value Date   NA 133 (L) 09/27/2021   K 4.9 09/27/2021   CO2 29 09/27/2021   GLUCOSE 85 09/27/2021   BUN 44 (H) 09/27/2021   CREATININE 9.97 (H) 09/27/2021   CALCIUM 8.7 (L) 09/27/2021   GFRNONAA 5 (L) 09/27/2021   GFRAA 5 (L) 08/05/2019   Lipid Panel:  Lab Results  Component Value Date   LDLCALC 84 03/08/2020   HgbA1c:  Lab Results  Component Value Date   HGBA1C 4.8 11/15/2020   Urine Drug Screen: No results found for: "LABOPIA", "COCAINSCRNUR", "LABBENZ", "AMPHETMU", "THCU", "LABBARB"  Alcohol Level     Component Value Date/Time   ETH <10 03/12/2018 0726   INR  Lab Results  Component Value Date   INR 1.1 09/17/2021   APTT  Lab Results  Component Value Date   APTT 27 09/17/2021   AED levels: No results found for: "PHENYTOIN", "ZONISAMIDE", "LAMOTRIGINE", "LEVETIRACETA"  CT Head without contrast(Personally reviewed): Acute appearing L PCA stroke.  CT angio Head and Neck with contrast(Personally reviewed): Attempted twice but failed due to IV infiltration.  MRI Brain(Personally reviewed): No acute stroke. Noted L PCA stroke on CT Head is chronic.  ASSESSMENT   Elza Sortor Sr. is a 63 y.o. male with hx of gastric ulcers and GI bleed requiring blood transfusions in the past, GERD, OSA, seizures, HTN, prior lacunar strokes who fell face first, unable to get up. Brought in POV with all extremity weakness and numbness. Code stroke activated in the ED.  Clinical exam with no cranial nerve or facial deficit. However, profound all extremity weakness with numbness/pins and needles sensation. Right arm is weaker than left.  Presentation with all extremity weakness is atypical for stroke, no brainstem findings is less suggestive of basilar artery stroke. Overall concern for stroke is low based on clinical exam.  He was not offered tnkase due to concern for concussion, concern for potential C spine injury. Weakness pattern fits in clinically more with a Cervical cord lesion rather than a stroke. Prior hx of peptic ulcer disease  with GI bleed requiring blood transfusion. CTA H and N attempted and he did get contrast bolus x 2 but with IV infiltration twice, unfortunately, unable to get CTA.  He was taken to STAT MRI Brain which was negative for stroke.  RECOMMENDATIONS  - MRI Brain w/o contrast is not suggestive of stroke. - MRI C spine w/o contrast is ordered and pending.   9:37 PM Update: MRI C spine: 1. Evolving C4 fracture with 50% height loss and 4 mm bony retropulsion, subacute in appearance, and likely pathologic due to underlying renal osteodystrophy as postulated on prior CT. Underlying infection could conceivably have this appearance as well, and could be considered in the correct clinical setting. 2. Resultant severe spinal stenosis at the level of C4. Patchy signal abnormality involving the cervical cord extending from C3 through C6, consistent with edema and/or evolving myelomalacia. 3. Multifactorial degenerative changes at C3-4 and C5-6 with resultant moderate spinal stenosis at both levels. Moderate right C4, bilateral C5, left C6 foraminal narrowing.  Noted C spine fracture/stenosis and cord signal abnormality/compression explains his symptoms. Neurology will signoff. ED team consulting neurosurgery. ______________________________________________________________________  This patient is critically ill and at significant risk of neurological worsening, death and care requires constant monitoring of vital signs, hemodynamics,respiratory and cardiac monitoring, neurological assessment, discussion  with family, other specialists and medical decision making of high complexity. I spent 60 minutes of neurocritical care time  in the care of  this patient. This was time spent independent of any time provided by nurse practitioner or PA.  Nina Mondor [facility] 05/25/2023  8:32 PM    Signed, Marquise Wicke, MD [facility]

## 2023-05-26 ENCOUNTER — Encounter (HOSPITAL_COMMUNITY): Payer: Self-pay | Admitting: Internal Medicine

## 2023-05-26 ENCOUNTER — Inpatient Hospital Stay (HOSPITAL_COMMUNITY): Admitting: Certified Registered Nurse Anesthetist

## 2023-05-26 ENCOUNTER — Inpatient Hospital Stay (HOSPITAL_COMMUNITY)

## 2023-05-26 ENCOUNTER — Other Ambulatory Visit: Payer: Self-pay

## 2023-05-26 DIAGNOSIS — S12300A Unspecified displaced fracture of fourth cervical vertebra, initial encounter for closed fracture: Secondary | ICD-10-CM

## 2023-05-26 DIAGNOSIS — I12 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 stage renal disease: Secondary | ICD-10-CM

## 2023-05-26 DIAGNOSIS — E1122 Type 2 diabetes mellitus with diabetic chronic kidney disease: Secondary | ICD-10-CM | POA: Diagnosis not present

## 2023-05-26 DIAGNOSIS — Z992 Dependence on renal dialysis: Secondary | ICD-10-CM

## 2023-05-26 DIAGNOSIS — N186 End stage renal disease: Secondary | ICD-10-CM

## 2023-05-26 LAB — GLUCOSE, CAPILLARY: Glucose-Capillary: 90 mg/dL (ref 70–99)

## 2023-05-26 MED ORDER — LIDOCAINE 2% (20 MG/ML) 5 ML SYRINGE
INTRAMUSCULAR | Status: AC
  Filled 2023-05-26: qty 5

## 2023-05-26 MED ORDER — PROPOFOL 10 MG/ML IV BOLUS
INTRAVENOUS | Status: DC | PRN
  Administered 2023-05-26: 170 mg via INTRAVENOUS

## 2023-05-26 MED ORDER — LACTATED RINGERS IV SOLN: INTRAVENOUS | Status: DC | PRN

## 2023-05-26 MED ORDER — LIDOCAINE 2% (20 MG/ML) 5 ML SYRINGE
INTRAMUSCULAR | Status: DC | PRN
  Administered 2023-05-26: 60 mg via INTRAVENOUS

## 2023-05-26 MED ORDER — PHENYLEPHRINE 80 MCG/ML (10ML) SYRINGE FOR IV PUSH (FOR BLOOD PRESSURE SUPPORT)
PREFILLED_SYRINGE | INTRAVENOUS | Status: DC | PRN
  Administered 2023-05-26: 240 ug via INTRAVENOUS
  Administered 2023-05-26: 160 ug via INTRAVENOUS

## 2023-05-26 MED ORDER — VASOPRESSIN 20 UNIT/ML IV SOLN
INTRAVENOUS | Status: AC
  Filled 2023-05-26: qty 1

## 2023-05-26 MED ORDER — POVIDONE-IODINE 7.5 % EX SOLN
Freq: Once | CUTANEOUS | Status: DC
  Filled 2023-05-26: qty 118

## 2023-05-26 MED ORDER — ACETAMINOPHEN 10 MG/ML IV SOLN: 1000.0000 mg | Freq: Once | INTRAVENOUS | Status: DC | PRN

## 2023-05-26 MED ORDER — EPHEDRINE SULFATE-NACL 50-0.9 MG/10ML-% IV SOSY
PREFILLED_SYRINGE | INTRAVENOUS | Status: DC | PRN
  Administered 2023-05-26: 5 mg via INTRAVENOUS
  Administered 2023-05-26 (×2): 10 mg via INTRAVENOUS

## 2023-05-26 MED ORDER — CEFAZOLIN SODIUM-DEXTROSE 2-4 GM/100ML-% IV SOLN: 2.0000 g | INTRAVENOUS | Status: DC

## 2023-05-26 MED ORDER — ACETAMINOPHEN 650 MG RE SUPP: 650.0000 mg | RECTAL | Status: DC | PRN

## 2023-05-26 MED ORDER — SUCCINYLCHOLINE CHLORIDE 200 MG/10ML IV SOSY
PREFILLED_SYRINGE | INTRAVENOUS | Status: DC | PRN
  Administered 2023-05-26: 160 mg via INTRAVENOUS

## 2023-05-26 MED ORDER — CEFAZOLIN SODIUM-DEXTROSE 2-3 GM-%(50ML) IV SOLR
INTRAVENOUS | Status: DC | PRN
  Administered 2023-05-26: 2 g via INTRAVENOUS

## 2023-05-26 MED ORDER — ROCURONIUM BROMIDE 10 MG/ML (PF) SYRINGE
PREFILLED_SYRINGE | INTRAVENOUS | Status: AC
  Filled 2023-05-26: qty 10

## 2023-05-26 MED ORDER — ROCURONIUM BROMIDE 10 MG/ML (PF) SYRINGE
PREFILLED_SYRINGE | INTRAVENOUS | Status: DC | PRN
  Administered 2023-05-26: 10 mg via INTRAVENOUS
  Administered 2023-05-26: 50 mg via INTRAVENOUS
  Administered 2023-05-26: 10 mg via INTRAVENOUS

## 2023-05-26 MED ORDER — 0.9 % SODIUM CHLORIDE (POUR BTL) OPTIME
TOPICAL | Status: DC | PRN
  Administered 2023-05-26: 1000 mL

## 2023-05-26 MED ORDER — CEFAZOLIN SODIUM-DEXTROSE 1-4 GM/50ML-% IV SOLN
1.0000 g | Freq: Three times a day (TID) | INTRAVENOUS | Status: AC
  Administered 2023-05-26 (×2): 1 g via INTRAVENOUS
  Filled 2023-05-26 (×2): qty 50

## 2023-05-26 MED ORDER — ONDANSETRON HCL 4 MG/2ML IJ SOLN
INTRAMUSCULAR | Status: DC | PRN
  Administered 2023-05-26: 4 mg via INTRAVENOUS

## 2023-05-26 MED ORDER — CYCLOBENZAPRINE HCL 10 MG PO TABS
10.0000 mg | ORAL_TABLET | Freq: Three times a day (TID) | ORAL | Status: DC | PRN
  Administered 2023-05-26 – 2023-05-30 (×7): 10 mg via ORAL
  Filled 2023-05-26 (×7): qty 1

## 2023-05-26 MED ORDER — THROMBIN 20000 UNITS EX SOLR: CUTANEOUS | Status: DC | PRN

## 2023-05-26 MED ORDER — ONDANSETRON HCL 4 MG/2ML IJ SOLN
INTRAMUSCULAR | Status: AC
  Filled 2023-05-26: qty 2

## 2023-05-26 MED ORDER — ACETAMINOPHEN 10 MG/ML IV SOLN
INTRAVENOUS | Status: DC | PRN
  Administered 2023-05-26: 1000 mg via INTRAVENOUS

## 2023-05-26 MED ORDER — CHLORHEXIDINE GLUCONATE CLOTH 2 % EX PADS
6.0000 | MEDICATED_PAD | Freq: Every day | CUTANEOUS | Status: DC
  Administered 2023-05-26 – 2023-05-30 (×4): 6 via TOPICAL

## 2023-05-26 MED ORDER — EPHEDRINE 5 MG/ML INJ
INTRAVENOUS | Status: AC
  Filled 2023-05-26: qty 5

## 2023-05-26 MED ORDER — PHENYLEPHRINE HCL-NACL 20-0.9 MG/250ML-% IV SOLN
INTRAVENOUS | Status: DC | PRN
  Administered 2023-05-26: 60 ug/min via INTRAVENOUS

## 2023-05-26 MED ORDER — SUCCINYLCHOLINE CHLORIDE 200 MG/10ML IV SOSY
PREFILLED_SYRINGE | INTRAVENOUS | Status: AC
  Filled 2023-05-26: qty 10

## 2023-05-26 MED ORDER — PHENOL 1.4 % MT LIQD: 1.0000 | OROMUCOSAL | Status: DC | PRN

## 2023-05-26 MED ORDER — GABAPENTIN 300 MG PO CAPS
300.0000 mg | ORAL_CAPSULE | Freq: Every day | ORAL | Status: DC
  Administered 2023-05-26 – 2023-05-28 (×3): 300 mg via ORAL
  Filled 2023-05-26 (×3): qty 1

## 2023-05-26 MED ORDER — THROMBIN 5000 UNITS EX SOLR: OROMUCOSAL | Status: DC | PRN

## 2023-05-26 MED ORDER — FENTANYL CITRATE (PF) 250 MCG/5ML IJ SOLN
INTRAMUSCULAR | Status: DC | PRN
  Administered 2023-05-26: 100 ug via INTRAVENOUS

## 2023-05-26 MED ORDER — HYDROMORPHONE HCL 1 MG/ML IJ SOLN: 1.0000 mg | INTRAMUSCULAR | Status: DC | PRN

## 2023-05-26 MED ORDER — DEXAMETHASONE SODIUM PHOSPHATE 10 MG/ML IJ SOLN
INTRAMUSCULAR | Status: DC | PRN
  Administered 2023-05-26: 10 mg via INTRAVENOUS

## 2023-05-26 MED ORDER — FENTANYL CITRATE (PF) 100 MCG/2ML IJ SOLN
25.0000 ug | INTRAMUSCULAR | Status: DC | PRN
  Administered 2023-05-26 (×2): 50 ug via INTRAVENOUS

## 2023-05-26 MED ORDER — PHENYLEPHRINE 80 MCG/ML (10ML) SYRINGE FOR IV PUSH (FOR BLOOD PRESSURE SUPPORT)
PREFILLED_SYRINGE | INTRAVENOUS | Status: AC
  Filled 2023-05-26: qty 10

## 2023-05-26 MED ORDER — SODIUM CHLORIDE 0.9 % IV SOLN: INTRAVENOUS | Status: DC | PRN

## 2023-05-26 MED ORDER — POVIDONE-IODINE 10 % EX SWAB: 2.0000 | Freq: Once | CUTANEOUS | Status: DC

## 2023-05-26 MED ORDER — DEXAMETHASONE SODIUM PHOSPHATE 10 MG/ML IJ SOLN
INTRAMUSCULAR | Status: AC
  Filled 2023-05-26: qty 1

## 2023-05-26 MED ORDER — MENTHOL 3 MG MT LOZG: 1.0000 | LOZENGE | OROMUCOSAL | Status: DC | PRN

## 2023-05-26 MED ORDER — SODIUM CHLORIDE 0.9% FLUSH
3.0000 mL | Freq: Two times a day (BID) | INTRAVENOUS | Status: DC
  Administered 2023-05-26 – 2023-05-28 (×6): 3 mL via INTRAVENOUS

## 2023-05-26 MED ORDER — SUGAMMADEX SODIUM 200 MG/2ML IV SOLN
INTRAVENOUS | Status: DC | PRN
  Administered 2023-05-26: 200 mg via INTRAVENOUS

## 2023-05-26 MED ORDER — HYDROCODONE-ACETAMINOPHEN 5-325 MG PO TABS
1.0000 | ORAL_TABLET | ORAL | Status: DC | PRN
  Administered 2023-05-26: 1 via ORAL
  Filled 2023-05-26: qty 1

## 2023-05-26 MED ORDER — MIDAZOLAM HCL 2 MG/2ML IJ SOLN
INTRAMUSCULAR | Status: DC | PRN
  Administered 2023-05-26 (×2): 1 mg via INTRAVENOUS

## 2023-05-26 MED ORDER — SODIUM CHLORIDE 0.9 % IV SOLN: 250.0000 mL | INTRAVENOUS | Status: AC

## 2023-05-26 MED ORDER — HYDROCODONE-ACETAMINOPHEN 10-325 MG PO TABS: 2.0000 | ORAL_TABLET | ORAL | Status: DC | PRN

## 2023-05-26 MED ORDER — FENTANYL CITRATE (PF) 100 MCG/2ML IJ SOLN
INTRAMUSCULAR | Status: AC
  Filled 2023-05-26: qty 2

## 2023-05-26 MED ORDER — ACETAMINOPHEN 325 MG PO TABS
650.0000 mg | ORAL_TABLET | ORAL | Status: DC | PRN
  Administered 2023-05-30: 650 mg via ORAL
  Filled 2023-05-26: qty 2

## 2023-05-26 MED ORDER — SODIUM CHLORIDE 0.9% FLUSH: 3.0000 mL | INTRAVENOUS | Status: DC | PRN

## 2023-05-26 NOTE — Brief Op Note (Signed)
 05/25/2023 - 05/26/2023  2:37 AM  PATIENT:  [name].  63 y.o. 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   Delay start of Pharmacological VTE agent (>24hrs) due to surgical blood loss or risk of bleeding: yes

## 2023-05-26 NOTE — Progress Notes (Signed)
 Postop 0.  Patient doing well.  Minimal neck pain.  No radiating pain.  Preoperative numbness and dysesthesias improved.  Patient states that he has thought better use of both upper extremities.  Swallowing well.  Voice strong.  Afebrile.  Vital signs are stable.  Awake and alert.  Patient with 4/5 bilateral deltoid muscle grip strength.  4/5 biceps bilaterally.  4/5 wrist extensors bilaterally.  3/5 right triceps weakness 4/5 left triceps function.  Little bit better on grip function than preop on the left and the right but still significantly weak.  Patient with much improved intrinsic function on the left still with significant intrinsic function on the right but also improved from preop.  Lower extremity strength is stable still with a little bit more weakness on the right side than the left.  Wound clean and dry.  Chest and abdomen benign.  Status post C4 corpectomy for treatment of his C4 burst fracture with incomplete spinal cord injury.  Continue collar and begin therapies.  Mobilize.

## 2023-05-26 NOTE — Consult Note (Signed)
 [facility] Renal Consultation Note    Indication for Consultation:  Management of ESRD/hemodialysis, anemia, hypertension/volume, and secondary hyperparathyroidism.  HPI: Carl Blossom Sr. is a 63 y.o. male with PMH including ESRD on dialysis, HTN, GI bleed, CVA, sleep apnea who presented to the ED after a fall and weakness. He reports to me that he has been generally weaker for the past week. Yesterday, his knee suddenly gave out while walking and he fell. Was unable to get back up due to weakness so his son brought him to the ER. Hr reported significant arm and leg weakness. MRI of the spine showed C4 fracture with incomplete spinal cord surgery. He was taken to the OR urgently and underwent cervical fusion and C4 corpectomy. Labs notable for Na 140 K+ 3.8 BUN 36 Cr 7.6 Hgb 10.5. VSS.  Nephrology was consulted for management of ESRD. Pt seen in room this AM. He is alert and eating breakfast with assistance, MS seems to be at his baseline. He reports he already feels like his strength is improving. Reports good compliance with HD lately, last HD was Friday. Denies SOB, CP, dizziness, abdominal pain, N/V/D, fever, chills.   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    Parathyroid abnormality (HCC)    ectopic parathyroid gland   Presence of arteriovenous fistula for hemodialysis, primary (HCC)    RUE PER PT RLE   Refusal of blood product    NO WHOLE BLOOD  PROUCTS   Renal cell carcinoma (HCC)    s/p hand assisted laparoscopic bilateral nephrectomies 11/29/17, + RCC left   Secondary hyperparathyroidism (HCC)    Seizures (HCC)    one episode in past, due to" elevated Potassium" 08/02/20- "at least 4 years ago"   Sleep apnea    doesn't use CPAP anymore since weight loss   Stroke [facility])    no residual   Past Surgical History:  Procedure Laterality Date   AV FISTULA PLACEMENT Right    right arm   BIOPSY  08/01/2019   Procedure: BIOPSY;  Surgeon: Tami Falcon, MD;  Location: [facility] ENDOSCOPY;  Service: Endoscopy;;   BIOPSY  11/18/2020   Procedure: BIOPSY;  Surgeon: Elois Hair, MD;  Location: [facility] ENDOSCOPY;  Service: Gastroenterology;;   BIOPSY  09/13/2021   Procedure: BIOPSY;  Surgeon: Normie Becton., MD;  Location: [facility] ENDOSCOPY;  Service: Gastroenterology;;   CATARACT EXTRACTION W/ INTRAOCULAR LENS  IMPLANT, BILATERAL     COLON SURGERY     COLONOSCOPY N/A 08/04/2015   Procedure: COLONOSCOPY;  Surgeon: Nannette Babe, MD;  Location: [facility] ENDOSCOPY;  Service: Endoscopy;  Laterality: N/A;   COLONOSCOPY  2017   JMP@ [facility]-good prep-mass -recall 1 yr   COLONOSCOPY N/A 09/13/2021   Procedure: COLONOSCOPY;  Surgeon: Mansouraty, Albino Alu., MD;  Location: [facility] ENDOSCOPY;  Service: Gastroenterology;  Laterality: N/A;   COLONOSCOPY WITH PROPOFOL N/A 11/25/2020   Procedure: COLONOSCOPY WITH PROPOFOL;  Surgeon: Daina Drum, MD;  Location: [facility] ENDOSCOPY;  Service: Gastroenterology;  Laterality: N/A;   COLONOSCOPY WITH PROPOFOL N/A 09/23/2021   Procedure: COLONOSCOPY WITH PROPOFOL;  Surgeon: Annis Kinder, DO;  Location: [facility] ENDOSCOPY;  Service: Gastroenterology;  Laterality: N/A;   ENTEROSCOPY N/A 09/23/2021   Procedure: ENTEROSCOPY;  Surgeon: Annis Kinder, DO;  Location: [facility] ENDOSCOPY;  Service: Gastroenterology;  Laterality: N/A;  Will place order for video capsule study as we may opt to place it during procedure    ESOPHAGOGASTRODUODENOSCOPY N/A 08/01/2019   Procedure: ESOPHAGOGASTRODUODENOSCOPY (EGD);  Surgeon: Tami Falcon, MD;  Location: [facility] ENDOSCOPY;  Service: Endoscopy;  Laterality: N/A;   ESOPHAGOGASTRODUODENOSCOPY N/A 11/18/2020   Procedure: ESOPHAGOGASTRODUODENOSCOPY (EGD);  Surgeon: Elois Hair, MD;  Location: [facility] ENDOSCOPY;  Service: Gastroenterology;  Laterality: N/A;   ESOPHAGOGASTRODUODENOSCOPY (EGD) WITH PROPOFOL N/A 08/04/2019   Procedure: ESOPHAGOGASTRODUODENOSCOPY (EGD) WITH PROPOFOL;  Surgeon: Ace Holder, MD;  Location: [facility] ENDOSCOPY;  Service: Gastroenterology;  Laterality: N/A;   ESOPHAGOGASTRODUODENOSCOPY (EGD) WITH PROPOFOL N/A 09/13/2021   Procedure: ESOPHAGOGASTRODUODENOSCOPY (EGD) WITH PROPOFOL;  Surgeon: Brice Campi Albino Alu., MD;  Location: [facility] ENDOSCOPY;  Service: Gastroenterology;  Laterality: N/A;   GIVENS CAPSULE STUDY N/A 09/23/2021   Procedure: GIVENS CAPSULE STUDY;  Surgeon: Annis Kinder, DO;  Location: [facility] ENDOSCOPY;  Service: Gastroenterology;  Laterality: N/A;   graft left arm Left    for dialysis x 2. Removed   HOT HEMOSTASIS  11/18/2020   Procedure: HOT HEMOSTASIS (ARGON PLASMA COAGULATION/BICAP);  Surgeon: Elois Hair, MD;  Location: [facility] ENDOSCOPY;  Service: Gastroenterology;;   HOT HEMOSTASIS N/A 09/23/2021   Procedure: HOT HEMOSTASIS (ARGON PLASMA COAGULATION/BICAP);  Surgeon: Annis Kinder, DO;  Location: [facility] ENDOSCOPY;  Service: Gastroenterology;  Laterality: N/A;   INSERTION OF DIALYSIS CATHETER     Rt chest   LAPAROSCOPIC RIGHT COLECTOMY N/A 08/05/2015   Procedure: LAPAROSCOPIC RIGHT COLECTOMY- ASCENDING;  Surgeon: Lockie Rima, MD;  Location: [facility] OR;  Service: General;  Laterality: N/A;   MASS EXCISION Left 05/28/2019   Procedure: EXCISION SOFT TISSUE MASS LEFT SHOULDER;  Surgeon: Oralee Billow, MD;  Location: [facility] ORS;  Service: General;  Laterality: Left;   NEPHRECTOMY Bilateral    PARATHYROIDECTOMY N/A 06/12/2016   Procedure: TOTAL  PARATHYROIDECTOMY WITH AUTOTRANSPLANTATION TO LEFT FOREARM;  Surgeon: Oralee Billow, MD;  Location: [facility] OR;  Service: General;  Laterality: N/A;   PARATHYROIDECTOMY N/A 10/23/2016   Procedure: PARATHYROIDECTOMY;  Surgeon: Oralee Billow, MD;  Location: [facility] OR;  Service: General;  Laterality: N/A;   REVERSE SHOULDER ARTHROPLASTY Right 08/24/2020   Procedure: REVERSE SHOULDER ARTHROPLASTY;  Surgeon: Micheline Ahr, MD;  Location: [facility] OR;  Service: Orthopedics;  Laterality: Right;   REVISON OF ARTERIOVENOUS FISTULA Right 07/16/2017   Procedure: REVISION OF ARTERIOVENOUS FISTULA  Right ARM;  Surgeon: Adine Hoof, MD;  Location: [facility] OR;  Service: Vascular;  Laterality: Right;   TOTAL HIP ARTHROPLASTY Left 11/14/2020   Procedure: LEFT TOTAL HIP ARTHROPLASTY ANTERIOR APPROACH;  Surgeon: Wes Hamman, MD;  Location: [facility] OR;  Service: Orthopedics;  Laterality: Left;   UPPER GASTROINTESTINAL ENDOSCOPY  2021   @ [facility]   Family History  Problem Relation Age of Onset   Diabetes Father    Stroke Father    Hypertension Father    Uterine cancer Mother    Lupus Sister    Stroke Sister  Hypertension Sister    Anuerysm Brother        brain   Colon cancer Neg Hx    Esophageal cancer Neg Hx    Stomach cancer Neg Hx    Pancreatic cancer Neg Hx    Liver disease Neg Hx    Colon polyps Neg Hx    Rectal cancer Neg Hx    Social History:  reports that he quit smoking about 32 years ago. His smoking use included cigarettes. He has never used smokeless tobacco. He reports that he does not currently use alcohol after a past usage of about 1.0 standard drink of alcohol per week. He reports that he does not currently use drugs after having used the following drugs: Marijuana.  ROS: As per HPI otherwise negative.  Physical Exam: Vitals:   05/26/23 0415 05/26/23 0444 05/26/23 0500 05/26/23 0724  BP: 119/69 122/67 115/63 120/67  Pulse: 86 81 85   Resp: 15 16 18    Temp: 98.3 F (36.8 C) 97.9 F (36.6 C)     TempSrc:  Oral Oral Axillary  SpO2: 97% 98% 94% 94%  Weight:      Height:         General: Well developed, well nourished, in no acute distress. Wearing cervical collar Head: Normocephalic, atraumatic, sclera non-icteric, mucus membranes are moist. Lungs: Clear bilaterally to auscultation without wheezes, rales, or rhonchi. Breathing is unlabored. Heart: RRR with normal S1, S2. No murmurs, rubs, or gallops appreciated. Abdomen: Soft, non-tender, non-distended with normoactive bowel sounds.  Musculoskeletal:  Strength and tone appear normal for age. Lower extremities: trace pretibial edema b/l lower extremities Neuro: Alert and oriented X 3.  Psych:  Responds to questions appropriately with a normal affect. Dialysis Access: AVF + t/b  Allergies  Allergen Reactions   Infed [Iron Dextran] Other (See Comments)    Decreased BP    Vicodin [Hydrocodone-Acetaminophen] Other (See Comments)    Decreased BP   Diclofenac Other (See Comments)    Caused hypotension   Prior to Admission medications   Medication Sig Start Date End Date Taking? Authorizing Provider  acetaminophen (TYLENOL) 650 MG CR tablet Take 1,300 mg by mouth every 8 (eight) hours as needed for pain.   Yes [provider]  acetaminophen-codeine (TYLENOL #3) 300-30 MG tablet Take 1 tablet by mouth every 6 (six) hours as needed for moderate pain (pain score 4-6). 05/08/23  Yes Jasmine Mesi, MD  atorvastatin (LIPITOR) 10 MG tablet Take 10 mg by mouth every Thursday.   Yes [provider]  augmented betamethasone dipropionate (DIPROLENE-AF) 0.05 % ointment Apply 1 Application topically 2 (two) times daily as needed for rash. 08/28/21  Yes [provider]  cinacalcet (SENSIPAR) 60 MG tablet Take 120 mg by mouth daily with supper.   Yes [provider]  cromolyn (OPTICROM) 4 % ophthalmic solution Place 1 drop into both eyes daily as needed (redness).   Yes [provider]  Docusate  Sodium (DSS) 100 MG CAPS Take 100 mg by mouth daily as needed (constipation.).   Yes [provider]  folic acid (FOLVITE) 1 MG tablet Take 1 tablet (1 mg total) by mouth daily. 09/29/21  Yes Singh, Prashant K, MD  LOKELMA 10 g PACK packet Take 1 packet by mouth daily. 05/16/21  Yes [provider]  multivitamin (RENA-VIT) TABS tablet Take 1 tablet by mouth daily.   Yes [provider]  pantoprazole (PROTONIX) 40 MG tablet Take 1 tablet (40 mg total) by mouth  2 (two) times daily before a meal. Patient taking differently: Take 40 mg by mouth daily as needed (for acid reflux). 09/13/21  Yes Singh, Prashant K, MD  sevelamer carbonate (RENVELA) 800 MG tablet Take 3,200-4,000 mg by mouth 3 (three) times daily with meals. 06/12/21  Yes [provider]  sucralfate (CARAFATE) 1 GM/10ML suspension Take 10 mLs (1 g total) by mouth 2 (two) times daily. Patient taking differently: Take 1 g by mouth 2 (two) times daily as needed (indigestion/upset stomach.). 09/13/21  Yes Singh, Prashant K, MD  traMADol (ULTRAM) 50 MG tablet TAKE 1 TABLET BY MOUTH EVERY 12 HOURS AS NEEDED 04/15/23  Yes Sandie Cross, PA-C  vitamin E 180 MG (400 UNITS) capsule Take 400 Units by mouth 2 (two) times a week.   Yes [provider]  amoxicillin (AMOXIL) 500 MG capsule Take 4 pills one hour prior to dental work Patient not taking: Reported on 05/06/2023 05/04/21   Sandie Cross, PA-C  gabapentin (NEURONTIN) 100 MG capsule Take 300 mg by mouth at bedtime. Patient not taking: Reported on 05/25/2023 05/15/21   [provider]  oxyCODONE-acetaminophen (PERCOCET/ROXICET) 5-325 MG tablet Take 1-2 tablets by mouth every 6 (six) hours as needed. Patient not taking: Reported on 05/06/2023 02/03/21   [provider]  vitamin B-12 (CYANOCOBALAMIN) 1000 MCG tablet Take 1,000 mcg by mouth 2 (two) times a week.    [provider]   Current Facility-Administered Medications  Medication  Dose Route Frequency Provider Last Rate Last Admin   0.9 %  sodium chloride infusion  250 mL Intravenous PRN [name], MD       0.9 %  sodium chloride infusion  250 mL Intravenous Continuous Pool, [name], MD       acetaminophen (TYLENOL) tablet 650 mg  650 mg Oral Q4H PRN [name], MD       Or   acetaminophen (TYLENOL) suppository 650 mg  650 mg Rectal Q4H PRN [name], MD       Cecily Cohen ON 05/30/2023] atorvastatin (LIPITOR) tablet 10 mg  10 mg Oral Q Thu Pool, Henry, MD       ceFAZolin (ANCEF) IVPB 1 g/50 mL premix  1 g Intravenous Q8H Pool, Henry, MD   Stopped at 05/26/23 0540   cinacalcet (SENSIPAR) tablet 120 mg  120 mg Oral Q supper [name], MD       cyclobenzaprine (FLEXERIL) tablet 10 mg  10 mg Oral TID PRN [name], MD   10 mg at 05/26/23 0916   fentaNYL (SUBLIMAZE) 100 MCG/2ML injection            gabapentin (NEURONTIN) capsule 300 mg  300 mg Oral QHS [name], [name], MD       hydrALAZINE (APRESOLINE) injection 10 mg  10 mg Intravenous Q6H PRN [name], MD       HYDROcodone-acetaminophen (NORCO) 10-325 MG per tablet 2 tablet  2 tablet Oral Q4H PRN [name], MD       HYDROcodone-acetaminophen (NORCO/VICODIN) 5-325 MG per tablet 1 tablet  1 tablet Oral Q4H PRN [name], MD   1 tablet at 05/26/23 0916   HYDROmorphone (DILAUDID) injection 1 mg  1 mg Intravenous Q2H PRN [name], MD       menthol-cetylpyridinium (CEPACOL) lozenge 3 mg  1 lozenge Oral PRN [name], MD       Or   phenol (CHLORASEPTIC) mouth spray 1 spray  1 spray Mouth/Throat PRN [name], MD  ondansetron (ZOFRAN) tablet 4 mg  4 mg Oral Q6H PRN [name], MD       Or   ondansetron (ZOFRAN) injection 4 mg  4 mg Intravenous Q6H PRN [name], MD       pantoprazole (PROTONIX) EC tablet 40 mg  40 mg Oral BID AC Pool, Henry, MD   40 mg at 05/26/23 0916   povidone-iodine (BETADINE) 7.5 % scrub   Topical Once Magnant, Charles L, PA-C       povidone-iodine 10 % swab 2 Application  2  Application Topical Once Magnant, Charles L, PA-C       sevelamer carbonate (RENVELA) tablet 3,200-4,000 mg  3,200-4,000 mg Oral TID WC [name], MD   3,200 mg at 05/26/23 0917   sodium chloride flush (NS) 0.9 % injection 3 mL  3 mL Intravenous Q12H [name], MD       sodium chloride flush (NS) 0.9 % injection 3 mL  3 mL Intravenous Q12H Pool, [name], MD       sodium chloride flush (NS) 0.9 % injection 3 mL  3 mL Intravenous PRN [name], MD       sodium chloride flush (NS) 0.9 % injection 3 mL  3 mL Intravenous Q12H Pool, [name], MD       sodium chloride flush (NS) 0.9 % injection 3 mL  3 mL Intravenous PRN [name], MD       sucralfate (CARAFATE) 1 GM/10ML suspension 1 g  1 g Oral BID PRN [name], MD       Labs: Basic Metabolic Panel: Recent Labs  Lab 05/25/23 1830 05/25/23 1933  NA 140 140  K 3.8 3.8  CL 96* 97*  CO2 32  --   GLUCOSE 107* 89  BUN 35* 36*  CREATININE 7.18* 7.60*  CALCIUM 9.8  --    Liver Function Tests: Recent Labs  Lab 05/25/23 1830  AST 21  ALT 13  ALKPHOS 70  BILITOT 0.6  PROT 7.4  ALBUMIN 3.2*   No results for input(s): "LIPASE", "AMYLASE" in the last 168 hours. No results for input(s): "AMMONIA" in the last 168 hours. CBC: Recent Labs  Lab 05/25/23 1830 05/25/23 1933  WBC 6.4  --   NEUTROABS 4.4  --   HGB 9.9* 10.5*  HCT 31.8* 31.0*  MCV 92.2  --   PLT 203  --    Cardiac Enzymes: No results for input(s): "CKTOTAL", "CKMB", "CKMBINDEX", "TROPONINI" in the last 168 hours. CBG: Recent Labs  Lab 05/25/23 1904 05/26/23 0344  GLUCAP 81 90   Iron Studies: No results for input(s): "IRON", "TIBC", "TRANSFERRIN", "FERRITIN" in the last 72 hours. Studies/Results: DG PAIN CLINIC C-ARM 1-60 MIN NO REPORT Result Date: 05/26/2023 Fluoro was used, but no Radiologist interpretation will be provided. Please refer to "NOTES" tab for provider progress note.  DG PAIN CLINIC C-ARM 1-60 MIN NO REPORT Result Date: 05/26/2023 Fluoro was  used, but no Radiologist interpretation will be provided. Please refer to "NOTES" tab for provider progress note.  DG Cervical Spine 2 or 3 views Result Date: 05/26/2023 CLINICAL DATA:  Anterior cervical corpectomy at C4 with C3-5 fusion. EXAM: CERVICAL SPINE - 2-3 VIEW COMPARISON:  CT imaging from 1 day prior FINDINGS: 2 intraoperative cross-table lateral fluoro images demonstrate loss of vertebral body height at C4, similar to yesterday's CT scan. Interbody graft material noted C3-4 and C4-5. Anterior plate extends from C3 to C5 although inferior aspect of the plate in the inferior-most fixation  screws are obscured by the patient's shoulders. Endotracheal tube and esophageal temperature probe evident. IMPRESSION: Intraoperative fluoroscopy during C3-5 discectomy infusion with anterior plate from Z6-X0. No evidence for immediate complicating features. Electronically Signed   By: Donnal Fusi M.D.   On: 05/26/2023 05:30   MR CERVICAL SPINE WO CONTRAST Result Date: 05/25/2023 CLINICAL DATA:  Initial evaluation for acute myelopathy. EXAM: MRI CERVICAL SPINE WITHOUT CONTRAST TECHNIQUE: Multiplanar, multisequence MR imaging of the cervical spine was performed. No intravenous contrast was administered. COMPARISON:  Prior CT from earlier the same day. FINDINGS: Alignment: Reversal of the normal cervical lordosis. No significant listhesis. Vertebrae: Evolving fracture involving the C4 vertebral body again seen, corresponding with abnormality on prior CT. Associated height loss measures up to 50% with 4 mm bony retropulsion. Extension through the posterior elements on the right. Fracture is pathologic in appearance, and suspected to be related underlying renal osteodystrophy as question on prior CT. Associated prominent reactive endplate changes about the C4-5 and C5-6 interspaces. Vertebral body height otherwise maintained. Decreased T1 signal intensity seen throughout the visualized bone marrow, suspected to be  related history of end-stage renal disease. No worrisome osseous lesions. No other abnormal marrow edema. Cord: Patchy signal abnormality seen involving the cervical cord extending from C3 through C6, consistent with edema and/or evolving myelomalacia (series 10, image 9). Finding is suspected to be posttraumatic in nature related to the adjacent fracture. Posterior Fossa, vertebral arteries, paraspinal tissues: Large left mastoid effusion. Few retention cysts noted at the nasopharynx. Craniocervical junction normal. Mild diffuse prevertebral edema related to the acute C4 fracture. Suspected injury of the longitudinal ligament at the level of C4. Remainder of the major ligamentous structures appear grossly intact. Normal flow voids seen within the vertebral arteries bilaterally. Disc levels: C2-C3: Disc desiccation with mild disc bulge and uncovertebral spurring. Mild facet hypertrophy. Mild spinal stenosis. Foramina remain patent. C3-C4: Central to right paracentral disc osteophyte complex mildly flattens the ventral thecal sac. Superimposed right greater left facet hypertrophy with right-sided uncovertebral spurring. Moderate spinal stenosis. Moderate right C4 foraminal narrowing. Left neural foramina remains patent. C4-C5: Advance intervertebral disc space narrowing. 4 mm bony retropulsion related to the pathologic C4 fracture. Flattening and effacement of the ventral thecal sac. Secondary cord flattening with associated cord signal changes as above. Superimposed right-sided facet hypertrophy. Resultant severe spinal stenosis with the thecal sac measuring 5 mm in AP diameter at its most narrow point. Moderate bilateral C5 foraminal narrowing. C5-C6: Advanced intervertebral disc space narrowing with diffuse disc osteophyte complex. Flattening of the ventral thecal sac. Superimposed mild facet hypertrophy. Mild spinal stenosis. Moderate left C6 foraminal narrowing. Right neural foramen remains patent. C6-C7:  Degenerate intervertebral disc space narrowing with diffuse disc bulge and uncovertebral spurring. Mild facet hypertrophy. No significant spinal stenosis. Foramina remain patent. C7-T1: Disc desiccation without significant disc bulge. Mild facet hypertrophy. No significant stenosis. IMPRESSION: 1. Evolving C4 fracture with 50% height loss and 4 mm bony retropulsion, subacute in appearance, and likely pathologic due to underlying renal osteodystrophy as postulated on prior CT. Underlying infection could conceivably have this appearance as well, and could be considered in the correct clinical setting. 2. Resultant severe spinal stenosis at the level of C4. Patchy signal abnormality involving the cervical cord extending from C3 through C6, consistent with edema and/or evolving myelomalacia. 3. Multifactorial degenerative changes at C3-4 and C5-6 with resultant moderate spinal stenosis at both levels. Moderate right C4, bilateral C5, left C6 foraminal narrowing. Results were discussed by telephone at the  time of image acquisition on 05/25/2023 at 8:40 p.m. to provider [facility] , who verbally acknowledged these results. Electronically Signed   By: Virgia Griffins M.D.   On: 05/25/2023 21:22   CT CERVICAL SPINE WO CONTRAST Result Date: 05/25/2023 CLINICAL DATA:  Initial evaluation for acute trauma, fall. EXAM: CT CERVICAL SPINE WITHOUT CONTRAST TECHNIQUE: Multidetector CT imaging of the cervical spine was performed without intravenous contrast. Multiplanar CT image reconstructions were also generated. RADIATION DOSE REDUCTION: This exam was performed according to the departmental dose-optimization program which includes automated exposure control, adjustment of the mA and/or kV according to patient size and/or use of iterative reconstruction technique. COMPARISON:  Prior MRI from 06/27/2006. FINDINGS: Alignment: Reversal of the normal cervical lordosis. Underlying cervicothoracic levoscoliosis. No  listhesis. Skull base and vertebrae: Bones are somewhat diffusely sclerotic in appearance, suggesting a degree of underlying renal osteodystrophy. Skull base intact. Normal C1-2 articulations are preserved. Dens is intact. There is an evolving fracture involving the C4 vertebral body. Associated height loss measures up to 50% with 4 mm bony retropulsion. Fracture is pathologic in subacute in appearance, suspected to be related underlying renal osteodystrophy. Extension through the right posterior elements and C4 facet (series 7, image 37). Vertebral body height otherwise maintained. No worrisome osseous lesions. Soft tissues and spinal canal: Mild diffuse prevertebral edema. Vascular calcifications noted about the carotid bifurcations. Postoperative changes noted about the thyroid. Disc levels:  Advanced degenerative spondylosis at C4-5 and C5-6. Upper chest: Visualized upper chest demonstrates no acute finding. Other: None. IMPRESSION: 1. Evolving subacute fracture involving the C4 vertebral body with up to 50% height loss and 4 mm bony retropulsion. Extension through the right posterior elements/articular facet of C4. 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 2. Underlying advanced degenerative spondylosis at C4-5 and C5-6. Critical Value/emergent results were discussed by telephone at the time of image acquisition on 05/25/2023 at approximately 7:20 p.m. to provider Dr. Murvin Arthurs , who verbally acknowledged these results. Electronically Signed   By: Virgia Griffins M.D.   On: 05/25/2023 21:01   MR BRAIN WO CONTRAST Result Date: 05/25/2023 CLINICAL DATA:  Initial evaluation for acute neuro deficit, stroke suspected. EXAM: MRI HEAD WITHOUT CONTRAST TECHNIQUE: Multiplanar, multiecho pulse sequences of the brain and surrounding structures were obtained without intravenous contrast. COMPARISON:  CT from earlier the same day. FINDINGS: Brain: Cerebral  volume within normal limits. Patchy T2/FLAIR hyperintensity involving the periventricular deep white matter both cerebral hemispheres, consistent chronic small vessel ischemic disease, mild in nature. Remote lacunar infarct present at the anterior left basal ganglia. Encephalomalacia and gliosis involving the left occipital lobe, consistent with a chronic left PCA distribution infarct. This corresponds with abnormality on prior CT. Mild chronic hemosiderin staining at this location. No evidence for acute or subacute ischemia. Gray-white matter differentiation maintained. No other areas of chronic cortical infarction. No other acute or chronic intracranial blood products. No mass lesion, midline shift or mass effect. No hydrocephalus or extra-axial fluid collection. Pituitary gland within normal limits. Vascular: Major intracranial vascular flow voids are well maintained. Skull and upper cervical spine: Craniocervical junction within normal limits. Decreased T1 signal intensity noted within the visualized bone marrow, nonspecific, but most commonly related to anemia, smoking or obesity. Probable evolving C4 fracture, better seen on corresponding MRI of the cervical spine. Soft tissue contusion at the left frontal scalp. Sinuses/Orbits: Prior bilateral ocular lens replacement. Paranasal sinuses are largely clear. Large left mastoid and middle ear effusion, with some  fluid signal intensity within the left petrous apex. Few small retention cysts noted at the nasopharynx. Other: None. IMPRESSION: 1. No acute intracranial abnormality. 2. Small soft tissue contusion at the left frontal scalp. 3. Chronic left PCA distribution infarct. 4. Underlying mild chronic microvascular ischemic disease with remote lacunar infarct at the anterior left basal ganglia. 5. Probable evolving C4 fracture, better seen on corresponding MRI of the cervical spine. 6. Large left mastoid and middle ear effusion. Several small retention cyst  present at the nasopharynx, suggesting that this finding is postobstructive in nature. Electronically Signed   By: Virgia Griffins M.D.   On: 05/25/2023 20:51   CT HEAD CODE STROKE WO CONTRAST Result Date: 05/25/2023 CLINICAL DATA:  Code stroke. Initial evaluation for acute neuro deficit, stroke suspected. EXAM: CT HEAD WITHOUT CONTRAST TECHNIQUE: Contiguous axial images were obtained from the base of the skull through the vertex without intravenous contrast. RADIATION DOSE REDUCTION: This exam was performed according to the departmental dose-optimization program which includes automated exposure control, adjustment of the mA and/or kV according to patient size and/or use of iterative reconstruction technique. COMPARISON:  CT from 09/11/2021 FINDINGS: Brain: Extensive dural calcifications again noted. Focal hypodensity involving the left occipital lobe, consistent with a small acute left PCA distribution infarct (image 17). No convincing acute intracranial hemorrhage. Note made of a small apparent focal hyperdensity at the high left frontal convexity, seen on axial slice (series 2, image 24), not confidently seen on corresponding sequences, and favored to be artifactual. No other acute large vessel territory infarct. No mass lesion or midline shift. No hydrocephalus. No extra-axial fluid collection. Vascular: No abnormal hyperdense vessel. Scattered calcified atherosclerosis present at the skull base. Skull: Soft tissue contusion present at the left frontal scalp near the vertex. Calvarium intact. Sinuses/Orbits: Globes orbital soft tissues within normal limits. Paranasal sinuses are largely clear. Large left mastoid and middle ear effusion. Mild asymmetric soft tissue fullness at the left nasopharynx without discrete mass (series 2, image 6). Other: None. ASPECTS [facility] Stroke Program Early CT Score) - Ganglionic level infarction (caudate, lentiform nuclei, internal capsule, insula, M1-M3 cortex): 7 -  Supraganglionic infarction (M4-M6 cortex): 3 Total score (0-10 with 10 being normal): 10 IMPRESSION: 1. Small acute left PCA distribution infarct. No acute intracranial hemorrhage. 2. Aspects equals 10. 3. Soft tissue contusion at the left frontal scalp. No calvarial fracture. No acute intracranial hemorrhage. 4. Underlying atrophy with chronic small vessel ischemic disease. Chronic lacunar infarct at the left basal ganglia. Case discussed by telephone at the time of interpretation on 05/25/2023 at 7:18 p.m. To provider Dr. Murvin Arthurs. Electronically Signed   By: Virgia Griffins M.D.   On: 05/25/2023 19:35    OP Dialysis Orders:  Center: [facility]  on MWF . 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 Mgt per neurosurgery  ESRD:  On MWF, no emergent indications for dialysis today. HD today per regular schedule  Hypertension/volume: BP controlled, trace edema on exam. UF with HD as tolerated tomorrow. Not currently on any antihypertensive meds  Anemia: Hgb 10.5, ESA recently dosed  Metabolic bone disease: Calcium a bit elevated, hold VDRA for now   Nutrition:  On regular diet, monitor for now, may need renal diet  Ramona Burner, PA-C 05/26/2023, 10:13 AM  [facility] Pager: [phone]

## 2023-05-26 NOTE — Transfer of Care (Signed)
 Immediate Anesthesia Transfer of Care Note  Patient: Carl Pray Sr.  Procedure(s) Performed: ANTERIOR CERVICAL CORPECTOMY CERVICAL FOUR, CERVICAL THREE - CERVICAL FIVE FUSION (Neck)  Patient Location: PACU  Anesthesia Type:General  Level of Consciousness: drowsy  Airway & Oxygen Therapy: Patient Spontanous Breathing and Patient connected to nasal cannula oxygen  Post-op Assessment: Report given to RN and Post -op Vital signs reviewed and stable  Post vital signs: Reviewed and stable  Last Vitals:  Vitals Value Taken Time  BP 163/76 05/26/23 0253  Temp    Pulse 77 05/26/23 0259  Resp 14 05/26/23 0259  SpO2 99 % 05/26/23 0259  Vitals shown include unfiled device data.  Last Pain:  Vitals:   05/25/23 1820  PainSc: 5          Complications: No notable events documented.

## 2023-05-26 NOTE — Evaluation (Signed)
 Occupational Therapy Evaluation Patient Details Name: Carl Burling Sr. MRN: [id number] DOB: Jun 27, 1960 Today's Date: 05/26/2023   History of Present Illness   The pt is a 63 yo male presenting 4/12 after a fall with subsequent difficulty moving legs and hands. 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 PMH includes: recurrent GIB, epilepsy, PRES, TIA, HTN, renal cell carcinoma, CVA, OSA, ESRD, DM II, and Ischemic colitis.     Clinical Impressions PTA, pt lives with son in a second floor apartment, reports typically Modified Independent with ADLs, IADLs and mobility using cane though does endorse recent falls. Pt presents now with deficits in BUE ROM/strength, coordination, sensation and sitting/standing balance. RUE with chronic shoulder issues and progressive R hand weakness but s/p fall, L shoulder and L hand with new strength/ROM deficits. Pt requiring overall Mod A x 2 for bed mobility and transfer via Stedy w/ elevated surface. Pt requires up to Max A for UB ADL and Total A for LB ADLs d/t impaired BUE use. Based on high PLOF and rehab potential, recommend intensive rehab services prior to DC home.      If plan is discharge home, recommend the following:   A lot of help with walking and/or transfers;Two people to help with walking and/or transfers;A lot of help with bathing/dressing/bathroom;Two people to help with bathing/dressing/bathroom;Assistance with feeding;Assistance with cooking/housework;Assist for transportation;Help with stairs or ramp for entrance     Functional Status Assessment   Patient has had a recent decline in their functional status and demonstrates the ability to make significant improvements in function in a reasonable and predictable amount of time.     Equipment Recommendations   Other (comment) (TBD pending progress)     Recommendations for Other Services   Rehab consult      Precautions/Restrictions   Precautions Precautions: Fall;Cervical Precaution Booklet Issued:  (discussed verbally) Recall of Precautions/Restrictions: Intact Required Braces or Orthoses: Cervical Brace Cervical Brace: Hard collar Restrictions Weight Bearing Restrictions Per Provider Order: No     Mobility Bed Mobility Overal bed mobility: Needs Assistance Bed Mobility: Rolling, Sidelying to Sit Rolling: Mod assist, +2 for safety/equipment Sidelying to sit: Mod assist, +2 for safety/equipment       General bed mobility comments: assist to LE and to trunk, unable to reach with RUE due to chronic shoulder issues, assist to maintain sitting balance    Transfers Overall transfer level: Needs assistance Equipment used: 2 person hand held assist, Ambulation equipment used Transfers: Sit to/from Stand, Bed to chair/wheelchair/BSC Sit to Stand: Mod assist, +2 physical assistance, From elevated surface           General transfer comment: sit-stand from elevated EOB x2 with modA, then stand with stedy to transfer to chair, minA to rise from stedy flaps, modA of 2 to lower to recliner due to pt's height, anticipate needing increased assist to stand from low surface due to pt height, limited LE strength, and limited shoulder ROM Transfer via Lift Equipment: Stedy    Balance Overall balance assessment: Needs assistance, History of Falls Sitting-balance support: Bilateral upper extremity supported, Feet supported Sitting balance-Leahy Scale: Poor Sitting balance - Comments: multiple LOB backwards with any LE movement Postural control: Posterior lean Standing balance support: Bilateral upper extremity supported, During functional activity Standing balance-Leahy Scale: Poor Standing balance comment: dependent on external assist  ADL either performed or assessed with clinical judgement   ADL Overall ADL's : Needs  assistance/impaired Eating/Feeding: Maximal assistance;Bed level;Sitting   Grooming: Maximal assistance;Sitting;Bed level;Moderate assistance   Upper Body Bathing: Moderate assistance;Sitting   Lower Body Bathing: Maximal assistance;+2 for physical assistance;+2 for safety/equipment;Sitting/lateral leans;Sit to/from stand   Upper Body Dressing : Moderate assistance;Sitting   Lower Body Dressing: Total assistance;+2 for physical assistance;+2 for safety/equipment;Sit to/from stand;Sitting/lateral leans       Toileting- Clothing Manipulation and Hygiene: +2 for safety/equipment;+2 for physical assistance;Total assistance;Sit to/from stand;Sitting/lateral lean         General ADL Comments: Limited by impaired strength and coordination in BUE including ability to grasp items effectively.     Vision Ability to See in Adequate Light: 0 Adequate Patient Visual Report: No change from baseline Vision Assessment?: No apparent visual deficits     Perception         Praxis         Pertinent Vitals/Pain Pain Assessment Pain Assessment: 0-10 Pain Score: 6  Pain Location: neck, hands Pain Descriptors / Indicators: Discomfort, Grimacing, Numbness Pain Intervention(s): Limited activity within patient's tolerance, Monitored during session     Extremity/Trunk Assessment Upper Extremity Assessment Upper Extremity Assessment: Left hand dominant;RUE deficits/detail;LUE deficits/detail RUE Deficits / Details: previously R handed but reports gradual decline in this UE's function. Reports need for shoulder surgery-potentially this year. HD fistula in this UE. able to bend/extend elbow. able to flex shoulder AAROM to 65*. unable to make fist or extend digits but passively [facility]. reports sensation in this UE more impaired than LUE RUE Sensation: decreased light touch RUE Coordination: decreased fine motor;decreased gross motor LUE Deficits / Details: pt's now dominant hand. able to extend  digits, unable to flex to make a fist but passively able to stretch. wrist and elbow AROM WFL. unable to flex this shoulder < 10*- reports this is new since the fall LUE Sensation: decreased light touch LUE Coordination: decreased fine motor;decreased gross motor   Lower Extremity Assessment Lower Extremity Assessment: RLE deficits/detail;LLE deficits/detail RLE Deficits / Details: 3-/5 for ankle DF, 3+/5 for knee extension, 4/5 for knee flexion, 2-/5 for hip flexion, 3/5 for hip abd/add. pt reports sensation intact, but not able to discern when PT touching toes/foot. 4/5 proprioception testing RLE Sensation: decreased light touch;decreased proprioception RLE Coordination: decreased fine motor;decreased gross motor LLE Deficits / Details: 4-/5 for ankle DF, 4-/5 for knee extension, 4/5 for knee flexion, 3/5 for hip flexion, 4/5 for hip abd/add. pt reports sensation intact LLE Sensation: WNL LLE Coordination: decreased fine motor;decreased gross motor   Cervical / Trunk Assessment Cervical / Trunk Assessment: Neck Surgery;Kyphotic   Communication Communication Communication: No apparent difficulties   Cognition Arousal: Alert Behavior During Therapy: WFL for tasks assessed/performed Cognition: No apparent impairments                               Following commands: Intact       Cueing  General Comments   Cueing Techniques: Verbal cues  Brother at bedside   Exercises     Shoulder Instructions      Home Living Family/patient expects to be discharged to:: Private residence Living Arrangements: Children Available Help at Discharge: Family Type of Home: Apartment Home Access: Stairs to enter Secretary/administrator of Steps: 18 Entrance Stairs-Rails: Right;Left Home Layout: Other (Comment);One level (in a second floor apartment)     Bathroom Shower/Tub: Tub/shower unit  Bathroom Toilet: Standard (toilet riser with 2 handles)     Home Equipment: Cane -  single point;Hand held shower head;Other (comment);Toilet riser;Rollator (4 wheels);Rolling Walker (2 wheels);BSC/3in1 (fall mat in tub shower)          Prior Functioning/Environment Prior Level of Function : Independent/Modified Independent;History of Falls (last six months);Driving             Mobility Comments: uses cane for mobility, multiple falls on stairs in last 3 months. ADLs Comments: Indep with ADLs, IADLs. was previously driving to/from HD but due to RLE difficulties has reduced driving    OT Problem List: Decreased strength;Decreased activity tolerance;Impaired balance (sitting and/or standing);Decreased knowledge of use of DME or AE;Decreased knowledge of precautions;Impaired UE functional use;Impaired sensation;Decreased coordination;Decreased range of motion   OT Treatment/Interventions: Self-care/ADL training;Therapeutic exercise;Energy conservation;DME and/or AE instruction;Therapeutic activities;Patient/family education;Balance training      OT Goals(Current goals can be found in the care plan section)   Acute Rehab OT Goals Patient Stated Goal: regain independence OT Goal Formulation: With patient Time For Goal Achievement: 06/09/23 Potential to Achieve Goals: Good   OT Frequency:  Min 2X/week    Co-evaluation PT/OT/SLP Co-Evaluation/Treatment: Yes Reason for Co-Treatment: Complexity of the patient's impairments (multi-system involvement);For patient/therapist safety;To address functional/ADL transfers PT goals addressed during session: Mobility/safety with mobility;Balance;Proper use of DME;Strengthening/ROM OT goals addressed during session: ADL's and self-care;Proper use of Adaptive equipment and DME;Strengthening/ROM      AM-PAC OT "6 Clicks" Daily Activity     Outcome Measure Help from another person eating meals?: A Lot Help from another person taking care of personal grooming?: A Lot Help from another person toileting, which includes using  toliet, bedpan, or urinal?: Total Help from another person bathing (including washing, rinsing, drying)?: A Lot Help from another person to put on and taking off regular upper body clothing?: A Lot Help from another person to put on and taking off regular lower body clothing?: Total 6 Click Score: 10   End of Session Equipment Utilized During Treatment: Gait belt;Cervical collar Nurse Communication: Mobility status  Activity Tolerance: Patient tolerated treatment well Patient left: in chair;with call bell/phone within reach;Other (comment);with family/visitor present (with PT at bedside)  OT Visit Diagnosis: Unsteadiness on feet (R26.81);Other abnormalities of gait and mobility (R26.89);Muscle weakness (generalized) (M62.81)                Time: 1610-9604 OT Time Calculation (min): 38 min Charges:  OT General Charges $OT Visit: 1 Visit OT Evaluation $OT Eval Moderate Complexity: 1 Mod  Lawrence Pretty, OTR/L Acute Rehab Services Office: [phone]   Annabella Barr 05/26/2023, 10:41 AM

## 2023-05-26 NOTE — Progress Notes (Signed)
 [facility] ROUNDING NOTE Carl Corella Sr. ZOX:[id number]  DOB: 1960-06-15  DOA: 05/25/2023  PCP: Lanae Pinal, MD  05/26/2023,7:14 AM  LOS: 1 day    Code Status: Full   From:  home   Current Dispo:  unclear   63 year old black male Jehov[employer] Witness?  Takes blood ESRD MWF HD Prior lacunar infarct Prior upper GI bleed EGD at that time superficial pyloric channel erythema NSAID stopped Recurrent bleed 11/2020 with hiatal hernia colonoscopy poor prep supposed to have repeat scope-readmitted 09/2021 capsule endoscopy negative still had blood loss anemia of unclear cause OSA on CPAP Hypotension in setting of chronic HD  Patient got up from sitting in trailer truck bed took 3 steps and face planted onto the floor unable to get up legs have he was not able to left bump on forehead-CT head showed?  Stroke MRI did not confirm patient had profound weakness lower extremities pins and needle sensation-neurology consulted- Neurosurgery consulted and felt evidence for C4 burst fracture severe canal compromise with pre-existing stenosis Emergently went for anterior cervical corpectomy under Dr. 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 status post corpectomy 4/13 Defer to neurosurgery further plan-pain control Norco every 4 as needed 1 tab, 1-2 tabs Norco pain 7/10 and IV Dilaudid 1 mg every 2 as needed severe pain, can use Flexeril 10 3 times daily spasm Resuming Neurontin 300 at bedtime which is a home med Presume will need skilled rehab  Prior lacunar infarct in the past Not on antiplatelet secondary to risk of bleeding with GI bleed previously  Recurrent prior GI bleed Continue Protonix 40 twice daily, can use Zofran-no antiplatelets Sucralfate should not be used in ESRD--he was not taking anyway at home  ESRD MWF Continue Sensipar 120, Renvela variable dosing 3 times daily meals Has occasional hypotension with HD-previously on midodrine now hold Trends to  hyperkalemia at times-Lokelma creatinine from home is 1 Contact nephrology for routine dialysis later on today  OSA on CPAP  resume  with home settings if he is actually using   No family present  DVT prophylaxis: SCD  Status is: Inpatient Remains inpatient appropriate because:   Requires further management and likely skilled placement  Subjective: Doing fair Has reobtain some feeling in his left leg but still limited on the right Overall was able to work with therapy according to nursing but was very weak in upper extremities and had to be fed Pain is moderate 5-6/10 He is sitting in chair  Objective + exam Vitals:   05/26/23 0400 05/26/23 0415 05/26/23 0444 05/26/23 0500  BP: 115/64 119/69 122/67 115/63  Pulse: 87 86 81 85  Resp: 10 15 16 18   Temp:  98.3 F (36.8 C) 97.9 F (36.6 C)   TempSrc:   Oral Oral  SpO2: 98% 97% 98% 94%  Weight:      Height:       Filed Weights   05/25/23 1821  Weight: 86.1 kg    Examination: EOMI NCAT no focal deficit hard collar in place with bandage on anterior right neck S1-S2 no murmur seems to be sinus, sinus tach Chest is clear no wheeze He is quite weak in his upper extremities his grip strength is about 4/5 I deferred reflexes in upper because of his large fistula on the right side He is able to raise his left knee against resistance and straight leg raise but he is having difficulty on his right side His right side seems colder  than his left and major dermatomes He does have bilateral 2/3 reflexes at knees We deferred cerebellar testing  Data Reviewed: reviewed   CBC    Component Value Date/Time   WBC 6.4 05/25/2023 1830   RBC 3.45 (L) 05/25/2023 1830   HGB 10.5 (L) 05/25/2023 1933   HCT 31.0 (L) 05/25/2023 1933   PLT 203 05/25/2023 1830   MCV 92.2 05/25/2023 1830   MCH 28.7 05/25/2023 1830   MCHC 31.1 05/25/2023 1830   RDW 16.1 (H) 05/25/2023 1830   LYMPHSABS 0.7 05/25/2023 1830   MONOABS 0.9 05/25/2023 1830    EOSABS 0.3 05/25/2023 1830   BASOSABS 0.1 05/25/2023 1830      Latest Ref Rng & Units 05/25/2023    7:33 PM 05/25/2023    6:30 PM 09/27/2021    3:51 AM  CMP  Glucose 70 - 99 mg/dL 89  621  85   BUN 8 - 23 mg/dL 36  35  44   Creatinine 0.61 - 1.24 mg/dL 3.08  6.57  8.46   Sodium 135 - 145 mmol/L 140  140  133   Potassium 3.5 - 5.1 mmol/L 3.8  3.8  4.9   Chloride 98 - 111 mmol/L 97  96  95   CO2 22 - 32 mmol/L  32  29   Calcium 8.9 - 10.3 mg/dL  9.8  8.7   Total Protein 6.5 - 8.1 g/dL  7.4    Total Bilirubin 0.0 - 1.2 mg/dL  0.6    Alkaline Phos 38 - 126 U/L  70    AST 15 - 41 U/L  21    ALT 0 - 44 U/L  13      Scheduled Meds:  [START ON 05/30/2023] atorvastatin  10 mg Oral Q Thu   Chlorhexidine Gluconate Cloth  6 each Topical Q0600   cinacalcet  120 mg Oral Q supper   fentaNYL       gabapentin  300 mg Oral QHS   pantoprazole  40 mg Oral BID AC   povidone-iodine   Topical Once   povidone-iodine  2 Application Topical Once   sevelamer carbonate  3,200-4,000 mg Oral TID WC   sodium chloride flush  3 mL Intravenous Q12H   sodium chloride flush  3 mL Intravenous Q12H   sodium chloride flush  3 mL Intravenous Q12H   Continuous Infusions:  sodium chloride     sodium chloride     acetaminophen      ceFAZolin (ANCEF) IV Stopped (05/26/23 0540)    Time 46  Verlie Glisson, MD  [facility]

## 2023-05-26 NOTE — Plan of Care (Signed)
  Problem: Education: Goal: Ability to verbalize activity precautions or restrictions will improve Outcome: Progressing Goal: Knowledge of the prescribed therapeutic regimen will improve Outcome: Progressing Goal: Understanding of discharge needs will improve Outcome: Progressing   Problem: Activity: Goal: Ability to avoid complications of mobility impairment will improve Outcome: Progressing Goal: Ability to tolerate increased activity will improve Outcome: Progressing Goal: Will remain free from falls Outcome: Progressing   Problem: Clinical Measurements: Goal: Ability to maintain clinical measurements within normal limits will improve Outcome: Progressing Goal: Postoperative complications will be avoided or minimized Outcome: Progressing Goal: Diagnostic test results will improve Outcome: Progressing   Problem: Pain Management: Goal: Pain level will decrease Outcome: Progressing   Problem: Skin Integrity: Goal: Will show signs of wound healing Outcome: Progressing   Problem: Health Behavior/Discharge Planning: Goal: Identification of resources available to assist in meeting health care needs will improve Outcome: Progressing

## 2023-05-26 NOTE — Anesthesia Postprocedure Evaluation (Signed)
 Anesthesia Post Note  Patient: [name]  Procedure(s) Performed: ANTERIOR CERVICAL CORPECTOMY CERVICAL FOUR, CERVICAL THREE - CERVICAL FIVE FUSION (Neck)     Patient location during evaluation: PACU Anesthesia Type: General Level of consciousness: awake and alert Pain management: pain level controlled Vital Signs Assessment: post-procedure vital signs reviewed and stable Respiratory status: spontaneous breathing, nonlabored ventilation, respiratory function stable and patient connected to nasal cannula oxygen Cardiovascular status: blood pressure returned to baseline and stable Postop Assessment: no apparent nausea or vomiting Anesthetic complications: no   No notable events documented.  Last Vitals:  Vitals:   05/26/23 0444 05/26/23 0500  BP: 122/67 115/63  Pulse: 81 85  Resp: 16 18  Temp: 36.6 C   SpO2: 98% 94%    Last Pain:  Vitals:   05/26/23 0500  TempSrc: Oral  PainSc:                  Valente Gaskin Loula Marcella

## 2023-05-26 NOTE — Progress Notes (Signed)
 Physical Therapy Treatment Patient Details Name: Carl Main Sr. MRN: [id number] DOB: [birth year] Today's Date: 05/26/2023   History of Present Illness The pt is a 63 yo male presenting 4/12 after a fall with subsequent difficulty moving legs and hands. 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 PMH includes: recurrent GIB, epilepsy, PRES, TIA, HTN, renal cell carcinoma, CVA, OSA, ESRD, DM II, and Ischemic colitis.    PT Comments  Returned to assist with transfer of pt from low recliner back to bed. The pt needed increased assistance to power up to standing from low recliner surface due to his height and inability to use BUE to push up due to poor shoulder ROM. Once standing, pt demos improved standing tolerance and stability once he has UE support. Continues to demo impaired LE strength needing assist to power up and control eccentric lower (even to elevated bed). Continue to recommend intensive therapies after d/c.     If plan is discharge home, recommend the following: Two people to help with walking and/or transfers;Two people to help with bathing/dressing/bathroom;Assistance with cooking/housework;Assistance with feeding;Direct supervision/assist for medications management;Direct supervision/assist for financial management;Assist for transportation;Help with stairs or ramp for entrance;Supervision due to cognitive status   Can travel by private vehicle        Equipment Recommendations  Wheelchair (measurements PT);Wheelchair cushion (measurements PT);Hoyer lift    Recommendations for [employer]       Precautions / Restrictions Precautions Precautions: Fall;Cervical Precaution Booklet Issued: Yes (comment) Recall of Precautions/Restrictions: Intact Required Braces or Orthoses: Cervical Brace Cervical Brace: Hard collar Restrictions Weight Bearing Restrictions Per Provider Order: No     Mobility  Bed  Mobility Overal bed mobility: Needs Assistance Bed Mobility: Sit to Supine Rolling: Mod assist, +2 for safety/equipment Sidelying to sit: Mod assist, +2 for safety/equipment   Sit to supine: Mod assist, +2 for safety/equipment   General bed mobility comments: cues for log roll, but pt returning to supine without sidelying despite cues, needed assist to manage trunk and return LE to bed    Transfers Overall transfer level: Needs assistance Equipment used: Ambulation equipment used Transfers: Sit to/from Stand, Bed to chair/wheelchair/BSC Sit to Stand: +2 physical assistance, Max assist           General transfer comment: icreased assist to power up from low recliner as pt unable to use arms due to shoulder ROM limitations, use of bed pad under hips to rise, then pt able to steady in standing wth UE support Transfer via Lift Equipment: Stedy  Ambulation/Gait             Pre-gait activities: standing marches in stedy, limited lift on each leg, no toe clearance, no buckling         Balance Overall balance assessment: Needs assistance, History of Falls Sitting-balance support: Bilateral upper extremity supported, Feet supported Sitting balance-Leahy Scale: Poor Sitting balance - Comments: multiple LOB backwards with any LE movement Postural control: Posterior lean Standing balance support: Bilateral upper extremity supported, During functional activity Standing balance-Leahy Scale: Poor Standing balance comment: dependent on external assist                            Communication Communication Communication: No apparent difficulties  Cognition Arousal: Alert Behavior During Therapy: WFL for tasks assessed/performed   PT - Cognitive impairments: No apparent impairments, Awareness, Memory, Problem solving, Safety/Judgement, Attention  PT - Cognition Comments: poor safety awareness, limited recall of staff/family (pt's brother  present entire session and pt didn't know which brother  was there) not formally assessed Following commands: Intact      Cueing Cueing Techniques: Verbal cues  Exercises      General Comments General comments (skin integrity, edema, etc.): VSS on RA      Pertinent Vitals/Pain Pain Assessment Pain Assessment: 0-10 Pain Score: 5  Pain Location: neck, hands Pain Descriptors / Indicators: Discomfort, Grimacing, Numbness Pain Intervention(s): Limited activity within patient's tolerance, Monitored during session, Premedicated before session    Home Living Family/patient expects to be discharged to:: Private residence Living Arrangements: Children Available Help at Discharge: Family Type of Home: Apartment Home Access: Stairs to enter Entrance Stairs-Rails: Doctor, general practice of Steps: 18   Home Layout: Other (Comment);One level (in a second floor apartment) Home Equipment: Cane - single point;Hand held shower head;Other (comment);Toilet riser;Rollator (4 wheels);Rolling Walker (2 wheels);BSC/3in1 (fall mat in tub shower)          PT Goals (current goals can now be found in the care plan section) Acute Rehab PT Goals Patient Stated Goal: return to walking without falls, be able to do stairs into apt PT Goal Formulation: With patient Time For Goal Achievement: 06/09/23 Potential to Achieve Goals: Fair Progress towards PT goals: Progressing toward goals    Frequency    Min 3X/week       Co-evaluation   Reason for Co-Treatment: Complexity of the patient's impairments (multi-system involvement);For patient/therapist safety;To address functional/ADL transfers PT goals addressed during session: Mobility/safety with mobility;Balance;Proper use of DME;Strengthening/ROM OT goals addressed during session: ADL's and self-care;Proper use of Adaptive equipment and DME;Strengthening/ROM      AM-PAC PT "6 Clicks" Mobility   Outcome Measure  Help needed turning  from your back to your side while in a flat bed without using bedrails?: A Lot Help needed moving from lying on your back to sitting on the side of a flat bed without using bedrails?: A Lot Help needed moving to and from a bed to a chair (including a wheelchair)?: Total Help needed standing up from a chair using your arms (e.g., wheelchair or bedside chair)?: A Lot Help needed to walk in hospital room?: Total Help needed climbing 3-5 steps with a railing? : Total 6 Click Score: 9    End of Session Equipment Utilized During Treatment: Gait belt;Cervical collar Activity Tolerance: Patient tolerated treatment well Patient left: with call bell/phone within reach;with nursing/sitter in room;in bed;with bed alarm set Nurse Communication: Mobility status PT Visit Diagnosis: Unsteadiness on feet (R26.81);Repeated falls (R29.6);Muscle weakness (generalized) (M62.81)     Time: 1610-9604 PT Time Calculation (min) (ACUTE ONLY): 11 min  Charges:    $Therapeutic Activity: 8-22 mins PT General Charges $$ ACUTE PT VISIT: 1 Visit                     Barnabas Booth, PT, DPT   Acute Rehabilitation Department Office [phone] Secure Chat Communication Preferred   Lona Rist 05/26/2023, 1:24 PM

## 2023-05-26 NOTE — Anesthesia Procedure Notes (Signed)
 Procedure Name: Intubation Date/Time: 05/26/2023 12:16 AM  Performed by: Melinda Sprawls, CRNAPre-anesthesia Checklist: Patient identified, Emergency Drugs available, Suction available, Patient being monitored and Timeout performed Patient Re-evaluated:Patient Re-evaluated prior to induction Oxygen Delivery Method: Circle system utilized Preoxygenation: Pre-oxygenation with 100% oxygen Induction Type: IV induction, Rapid sequence and Cricoid Pressure applied Laryngoscope Size: 4 and Glidescope Grade View: Grade I Tube type: Oral Number of attempts: 1 Airway Equipment and Method: Video-laryngoscopy and Stylet Placement Confirmation: ETT inserted through vocal cords under direct vision, positive ETCO2 and breath sounds checked- equal and bilateral Secured at: 23 cm Tube secured with: Tape Dental Injury: Teeth and Oropharynx as per pre-operative assessment  Comments: Pt in C-collar, elective glidescope intubation. Smooth IV Induction. In-line neck stabilization maintained throughout. Eyes taped. RSI Performed. DL x 1 with grade 1 view. Atraumatically placed, teeth and lip remain intact as pre-op. Secured with tape. Bilateral breath sounds +/=, EtCO2 +, Adequate TV, VSS.

## 2023-05-26 NOTE — Op Note (Addendum)
 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  Asst. Surgeon: Jennetta, NP  Anesthesia: General  Indication: 62 year old male with progressive numbness and weakness in both upper and lower extremities.  History consistent with significant cervical myelopathy.  The patient suffered a fall while walking today.  The patient had marked worsening of his weakness in both upper and lower extremities with some dysesthetic sensation in his distal upper extremities.  Workup demonstrates evidence of a C4 burst fracture superimposed upon severe degenerative disease at C3-4 and C4-5.  There is significant retropulsion of bone and severe cord compression with cord signal abnormality behind the level of the fracture.  There is no evidence of posterior element involvement.  The patient presents now for C4 corpectomy and fusion in hopes of improving his situation.  Operative note: After induction anesthesia, patient positioned supine with his neck slightly extended and held in place with halter traction.  Patient's anterior cervical region prepped and draped sterilely.  Incision made overlying C4.  Dissection performed on the right.  Retractor placed.  Fluoroscopy used.  Levels confirmed.  Disc space at C3-4 and C4-5 were incised with 15 blade.  Discectomy was performed using pituitary rongeurs and Karlin curettes to remove the disc down to level the posterior annulus.  Microscope then brought into the field used for microdissection of the spinal canal.  The fractured C4 vertebra was removed in a piecemeal fashion using Leksell rongeurs Kerrison rongeurs and osteophyte removing tools.  Posterior wall of residual body  remained.  Using the high-speed drill and Kerrison rongeurs the posterior wall of the vertebral body was resected to the disc space at C3-4 superiorly and into the disc space at C4-5 inferiorly.  Decompression proceeded laterally to the takeoffs of the C4 and C5 nerve roots bilaterally.  Posterior longitudinal ligament was elevated and resected.  Underlying thecal sac was identified.  The decompression then proceeded up to the C3-4 disc space.  The disc space was further resected using Kerrison rongeurs.  The body of C3 was undercut and the spinal canal was well decompressed at this level.  Procedure was then repeated at C4-5 once again undercutting the body of C5 to perform a complete decompression.  Anterior foraminotomies complete on the course the exiting C4 and C5 nerve roots bilaterally.  At this point a very thorough decompression been achieved.  There was no evidence of injury to the thecal sac nerve roots or spinal cord.  Wound was irrigated.  Surgifoam was used for hemostasis and then irrigated and removed with suction.  A 25 mm Medtronic stackable peek cage was then packed with local autograft harvested from the corpectomy.  This was then impacted into place and recessed slightly from the anterior cortical margins of the bodies of C3 and C5.  Atlantis anterior cervical plate was then placed over the C3-C5 levels.  This then attached under fluoroscopic guidance using 13 linear fixed angle screws to each at both levels.  All 4 screws given the final tightening found to be solidly within the bone.  Locking screws engaged at both levels.  Final images reveal good position of the cage and the hardware at the proper operative level with normal alignment of the spine.  Wound was irrigated.  Hemostasis was assured with  bipolar cautery.  Wounds then closed in layers with Vicryl sutures.  Steri-Strips and sterile dressing were applied.  No apparent complications.  Patient tolerated the procedure well and he returns  to the recovery room postop.   Addendum:  The assistant was utilized throughout the case during opening of the wound, providing exposure, assisting with the corpectomy, placement of cage and instrumentation and with closure.

## 2023-05-26 NOTE — TOC CAGE-AID Note (Signed)
 Transition of Care (TOC) - CAGE-AID Screening  Patient Details  Name: Carl Kabel Sr. MRN: [id number] Date of Birth: 06/17/1960  Clinical Narrative:  Patient states he is on the transplant list and does not drink alcohol or use illicit drugs. Patient denies need for substance abuse resources at this time.  CAGE-AID Screening:   Have You Ever Felt You Ought to Cut Down on Your Drinking or Drug Use?: No Have People Annoyed You By Critizing Your Drinking Or Drug Use?: No Have You Felt Bad Or Guilty About Your Drinking Or Drug Use?: No Have You Ever Had a Drink or Used Drugs First Thing In The Morning to Steady Your Nerves or to Get Rid of a Hangover?: No CAGE-AID Score: 0  Substance Abuse Education Offered: No

## 2023-05-26 NOTE — Progress Notes (Signed)

## 2023-05-26 NOTE — Plan of Care (Signed)
   Problem: Education: Goal: Ability to verbalize activity precautions or restrictions will improve Outcome: Progressing Goal: Knowledge of the prescribed therapeutic regimen will improve Outcome: Progressing Goal: Understanding of discharge needs will improve Outcome: Progressing   Problem: Activity: Goal: Ability to avoid complications of mobility impairment will improve Outcome: Progressing Goal: Ability to tolerate increased activity will improve Outcome: Progressing Goal: Will remain free from falls Outcome: Progressing   Problem: Bowel/Gastric: Goal: Gastrointestinal status for postoperative course will improve Outcome: Progressing   Problem: Clinical Measurements: Goal: Ability to maintain clinical measurements within normal limits will improve Outcome: Progressing Goal: Postoperative complications will be avoided or minimized Outcome: Progressing Goal: Diagnostic test results will improve Outcome: Progressing   Problem: Pain Management: Goal: Pain level will decrease Outcome: Progressing   Problem: Skin Integrity: Goal: Will show signs of wound healing Outcome: Progressing   Problem: Health Behavior/Discharge Planning: Goal: Identification of resources available to assist in meeting health care needs will improve Outcome: Progressing   Problem: Bladder/Genitourinary: Goal: Urinary functional status for postoperative course will improve Outcome: Progressing

## 2023-05-26 NOTE — Evaluation (Signed)
 Physical Therapy Evaluation Patient Details Name: Carl Hemmerich Sr. MRN: [id number] DOB: 30-Jul-1960 Today's Date: 05/26/2023  History of Present Illness  The pt is a 63 yo male presenting 4/12 after a fall with subsequent difficulty moving legs and hands. 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 PMH includes: recurrent GIB, epilepsy, PRES, TIA, HTN, renal cell carcinoma, CVA, OSA, ESRD, DM II, and Ischemic colitis.   Clinical Impression  Pt in bed upon arrival of PT, agreeable to evaluation at this time. Prior to admission the pt was walking with use of a cane, living in a 2nd floor apt with his son, and reports independence with ADLs, but frequent falls, especially on stairs to enter/leave apt. Pt presents today with limitations in use of BUE due to weakness and limited shoulder ROM, weakness in core making pt reliant on min-modA to maintain sitting balance, and weakness and sensory changes in LE requiring significant assist of 2 to rise to standing and pt unable to manage steps or standing marches at this time. Pt able to use stedy to transfer to recliner with assist of 2, but will need intensive therapies acutely and after d/c to maximize functional recovery and return to greatest level of independence. Pt in agreement with plan, has good family support and is highly motivated to return to baseline mobility.        If plan is discharge home, recommend the following: Two people to help with walking and/or transfers;Two people to help with bathing/dressing/bathroom;Assistance with cooking/housework;Assistance with feeding;Direct supervision/assist for medications management;Direct supervision/assist for financial management;Assist for transportation;Help with stairs or ramp for entrance;Supervision due to cognitive status   Can travel by private vehicle        Equipment Recommendations Wheelchair (measurements PT);Wheelchair  cushion (measurements PT);Hoyer lift  Recommendations for [employer]  Rehab consult    Functional Status Assessment Patient has had a recent decline in their functional status and demonstrates the ability to make significant improvements in function in a reasonable and predictable amount of time.     Precautions / Restrictions Precautions Precautions: Fall;Cervical Precaution Booklet Issued:  (discussed verbally) Recall of Precautions/Restrictions: Intact Required Braces or Orthoses: Cervical Brace Cervical Brace: Hard collar Restrictions Weight Bearing Restrictions Per Provider Order: No      Mobility  Bed Mobility Overal bed mobility: Needs Assistance Bed Mobility: Rolling, Sidelying to Sit Rolling: Mod assist, +2 for safety/equipment Sidelying to sit: Mod assist, +2 for safety/equipment       General bed mobility comments: assist to LE and to trunk, unable to reach with RUE due to chronic shoulder issues, assist to maintain sitting balance    Transfers Overall transfer level: Needs assistance Equipment used: 2 person hand held assist, Ambulation equipment used Transfers: Sit to/from Stand, Bed to chair/wheelchair/BSC Sit to Stand: Mod assist, +2 physical assistance, From elevated surface           General transfer comment: sit-stand from elevated EOB x2 with modA, then stand with stedy to transfer to chair, minA to rise from stedy flaps, modA of 2 to lower to recliner due to pt's height, anticipate needing increased assist to stand from low surface due to pt height, limited LE strength, and limited shoulder ROM Transfer via Lift Equipment: Stedy  Ambulation/Gait             Pre-gait activities: stnading marches in stedy, limited lift on each leg, no toe clearance, no buckling  Balance Overall balance assessment: Needs assistance, History of Falls Sitting-balance support: Bilateral upper extremity supported, Feet supported Sitting balance-Leahy  Scale: Poor Sitting balance - Comments: multiple LOB backwards with any LE movement Postural control: Posterior lean Standing balance support: Bilateral upper extremity supported, During functional activity Standing balance-Leahy Scale: Poor Standing balance comment: dependent on external assist                             Pertinent Vitals/Pain Pain Assessment Pain Assessment: 0-10 Pain Score: 6  Pain Location: neck, hands Pain Descriptors / Indicators: Discomfort, Grimacing, Numbness Pain Intervention(s): Limited activity within patient's tolerance, Monitored during session, Repositioned, Patient requesting pain meds-RN notified    Home Living Family/patient expects to be discharged to:: Private residence Living Arrangements: Children Available Help at Discharge: Family Type of Home: Apartment Home Access: Stairs to enter Entrance Stairs-Rails: Doctor, general practice of Steps: 18   Home Layout: Other (Comment);One level (in a second floor apartment) Home Equipment: Cane - single point;Hand held shower head;Other (comment);Toilet riser;Rollator (4 wheels);Rolling Walker (2 wheels);BSC/3in1 (fall mat in tub shower)      Prior Function Prior Level of Function : Independent/Modified Independent;History of Falls (last six months);Driving             Mobility Comments: uses cane for mobility, multiple falls on stairs in last 3 months. ADLs Comments: Indep with ADLs, IADLs     Extremity/Trunk Assessment   Upper Extremity Assessment Upper Extremity Assessment: Defer to OT evaluation    Lower Extremity Assessment Lower Extremity Assessment: RLE deficits/detail;LLE deficits/detail RLE Deficits / Details: 3-/5 for ankle DF, 3+/5 for knee extension, 4/5 for knee flexion, 2-/5 for hip flexion, 3/5 for hip abd/add. pt reports sensation intact, but not able to discern when PT touching toes/foot. 4/5 proprioception testing RLE Sensation: decreased light  touch;decreased proprioception RLE Coordination: decreased fine motor;decreased gross motor LLE Deficits / Details: 4-/5 for ankle DF, 4-/5 for knee extension, 4/5 for knee flexion, 3/5 for hip flexion, 4/5 for hip abd/add. pt reports sensation intact LLE Sensation: WNL LLE Coordination: decreased fine motor;decreased gross motor    Cervical / Trunk Assessment Cervical / Trunk Assessment: Neck Surgery;Kyphotic  Communication   Communication Communication: No apparent difficulties    Cognition Arousal: Alert Behavior During Therapy: WFL for tasks assessed/performed   PT - Cognitive impairments: No apparent impairments, Awareness, Memory, Problem solving, Safety/Judgement, Attention                       PT - Cognition Comments: poor safety awareness, limited recall of staff/family (pt's brother present entire session and pt didn't know which brother  was there) not formally assessed Following commands: Intact       Cueing Cueing Techniques: Verbal cues     General Comments General comments (skin integrity, edema, etc.): VSS oN RA, BP stable        Assessment/Plan    PT Assessment Patient needs continued PT services  PT Problem List Decreased strength;Decreased range of motion;Decreased activity tolerance;Decreased balance;Decreased mobility;Decreased coordination;Decreased cognition;Decreased knowledge of use of DME;Decreased safety awareness;Impaired sensation       PT Treatment Interventions Gait training;DME instruction;Stair training;Functional mobility training;Therapeutic activities;Therapeutic exercise;Balance training;Neuromuscular re-education;Cognitive remediation;Patient/family education    PT Goals (Current goals can be found in the Care Plan section)  Acute Rehab PT Goals Patient Stated Goal: return to walking without falls, be able to do stairs into apt PT Goal Formulation: With  patient Time For Goal Achievement: 06/09/23 Potential to Achieve  Goals: Fair    Frequency Min 3X/week     Co-evaluation PT/OT/SLP Co-Evaluation/Treatment: Yes Reason for Co-Treatment: Complexity of the patient's impairments (multi-system involvement);For patient/therapist safety;To address functional/ADL transfers PT goals addressed during session: Mobility/safety with mobility;Balance;Proper use of DME;Strengthening/ROM         AM-PAC PT "6 Clicks" Mobility  Outcome Measure Help needed turning from your back to your side while in a flat bed without using bedrails?: A Lot Help needed moving from lying on your back to sitting on the side of a flat bed without using bedrails?: A Lot Help needed moving to and from a bed to a chair (including a wheelchair)?: Total Help needed standing up from a chair using your arms (e.g., wheelchair or bedside chair)?: A Lot Help needed to walk in hospital room?: Total Help needed climbing 3-5 steps with a railing? : Total 6 Click Score: 9    End of Session Equipment Utilized During Treatment: Gait belt;Cervical collar Activity Tolerance: Patient tolerated treatment well Patient left: in chair;with call bell/phone within reach;with chair alarm set;with nursing/sitter in room Nurse Communication: Mobility status PT Visit Diagnosis: Unsteadiness on feet (R26.81);Repeated falls (R29.6);Muscle weakness (generalized) (M62.81)    Time: 1610-9604 PT Time Calculation (min) (ACUTE ONLY): 53 min   Charges:   PT Evaluation $PT Eval Moderate Complexity: 1 Mod PT Treatments $Therapeutic Exercise: 8-22 mins PT General Charges $$ ACUTE PT VISIT: 1 Visit         Barnabas Booth, PT, DPT   Acute Rehabilitation Department Office [phone] Secure Chat Communication Preferred  Lona Rist 05/26/2023, 9:38 AM

## 2023-05-27 ENCOUNTER — Encounter (HOSPITAL_COMMUNITY): Payer: Self-pay | Admitting: Neurosurgery

## 2023-05-27 ENCOUNTER — Other Ambulatory Visit: Payer: Self-pay

## 2023-05-27 DIAGNOSIS — S12300A Unspecified displaced fracture of fourth cervical vertebra, initial encounter for closed fracture: Secondary | ICD-10-CM | POA: Diagnosis not present

## 2023-05-27 LAB — COMPREHENSIVE METABOLIC PANEL WITH GFR
ALT: 6 U/L (ref 0–44)
AST: 15 U/L (ref 15–41)
Albumin: 2.8 g/dL — ABNORMAL LOW (ref 3.5–5.0)
Alkaline Phosphatase: 71 U/L (ref 38–126)
Anion gap: 15 (ref 5–15)
BUN: 54 mg/dL — ABNORMAL HIGH (ref 8–23)
CO2: 27 mmol/L (ref 22–32)
Calcium: 9.1 mg/dL (ref 8.9–10.3)
Chloride: 94 mmol/L — ABNORMAL LOW (ref 98–111)
Creatinine, Ser: 9.65 mg/dL — ABNORMAL HIGH (ref 0.61–1.24)
GFR, Estimated: 6 mL/min — ABNORMAL LOW (ref >60)
Glucose, Bld: 95 mg/dL (ref 70–99)
Potassium: 4.5 mmol/L (ref 3.5–5.1)
Sodium: 136 mmol/L (ref 135–145)
Total Bilirubin: 0.5 mg/dL (ref 0.0–1.2)
Total Protein: 6.9 g/dL (ref 6.5–8.1)

## 2023-05-27 LAB — CBC
HCT: 26.1 % — ABNORMAL LOW (ref 39.0–52.0)
Hemoglobin: 8.4 g/dL — ABNORMAL LOW (ref 13.0–17.0)
MCH: 29.1 pg (ref 26.0–34.0)
MCHC: 32.2 g/dL (ref 30.0–36.0)
MCV: 90.3 fL (ref 80.0–100.0)
Platelets: 199 10*3/uL (ref 150–400)
RBC: 2.89 MIL/uL — ABNORMAL LOW (ref 4.22–5.81)
RDW: 16.2 % — ABNORMAL HIGH (ref 11.5–15.5)
WBC: 7.3 10*3/uL (ref 4.0–10.5)
nRBC: 0 % (ref 0.0–0.2)

## 2023-05-27 LAB — HEPATITIS B SURFACE ANTIGEN: Hepatitis B Surface Ag: NONREACTIVE

## 2023-05-27 LAB — PROTIME-INR
INR: 1.1 (ref 0.8–1.2)
Prothrombin Time: 14.5 s (ref 11.4–15.2)

## 2023-05-27 MED ORDER — PENTAFLUOROPROP-TETRAFLUOROETH EX AERO: 1.0000 | INHALATION_SPRAY | CUTANEOUS | Status: DC | PRN

## 2023-05-27 MED ORDER — ANTICOAGULANT SODIUM CITRATE 4% (200MG/5ML) IV SOLN: 5.0000 mL | Status: DC | PRN

## 2023-05-27 MED ORDER — OXYCODONE-ACETAMINOPHEN 5-325 MG PO TABS
1.0000 | ORAL_TABLET | ORAL | Status: DC | PRN
  Administered 2023-05-27 (×2): 2 via ORAL
  Administered 2023-05-28 (×2): 1 via ORAL
  Administered 2023-05-28 – 2023-05-29 (×2): 2 via ORAL
  Administered 2023-05-29: 1 via ORAL
  Administered 2023-05-29 – 2023-05-30 (×2): 2 via ORAL
  Filled 2023-05-27 (×2): qty 2
  Filled 2023-05-27: qty 1
  Filled 2023-05-27: qty 2
  Filled 2023-05-27: qty 1
  Filled 2023-05-27: qty 2
  Filled 2023-05-27: qty 1
  Filled 2023-05-27 (×2): qty 2

## 2023-05-27 MED ORDER — HEPARIN SODIUM (PORCINE) 1000 UNIT/ML DIALYSIS: 1000.0000 [IU] | INTRAMUSCULAR | Status: DC | PRN

## 2023-05-27 MED ORDER — LIDOCAINE HCL (PF) 1 % IJ SOLN: 5.0000 mL | INTRAMUSCULAR | Status: DC | PRN

## 2023-05-27 MED ORDER — LIDOCAINE-PRILOCAINE 2.5-2.5 % EX CREA: 1.0000 | TOPICAL_CREAM | CUTANEOUS | Status: DC | PRN

## 2023-05-27 MED ORDER — ALTEPLASE 2 MG IJ SOLR: 2.0000 mg | Freq: Once | INTRAMUSCULAR | Status: DC | PRN

## 2023-05-27 NOTE — PMR Pre-admission (Signed)
 PMR Admission Coordinator Pre-Admission Assessment  Patient: Carl Vanvoorhis Sr. is an 63 y.o., male MRN: [id number] DOB: 06/28/1960 Height: 6\' 1"  (185.4 cm) Weight: 86.5 kg              Insurance Information HMO: HMOPOS Dual complete    PPO:      PCP:      IPA:      80/20:      OTHER:  PRIMARY: [employer] Dual complete  Policy#: [id number]   ; Medicare [id number]   Subscriber: patient CM Name: Marylene Land      Phone#: [phone] option 3   Fax#: [phone] Pre-Cert#: [id number]   approved for 7 days 4.17 until 4/22   Employer:  Benefits:  Phone #: [phone]     Name: 4/15 Eff. Date: 03/16/23     Deduct: $257      Out of Pocket Max: $9350      Life Max: none  CIR: $1565 co pay per admission      SNF: no co pay per day days 1 until 20; $209.50 co pay per day days 21 until 100 Outpatient: 80%     Co-Pay: 20% Home Health: 100%      Co-Pay: none DME: 80%     Co-Pay: 20% Providers: in network SECONDARY: Medicaid of [place]      Policy#: [id number] I; active per passport one source online on 4/15 [facility]  Financial Counselor:       Phone#:   The "Data Collection Information Summary" for patients in Inpatient Rehabilitation Facilities with attached "Privacy Act Statement-Health Care Records" was provided and verbally reviewed with: Patient and Family  Emergency Contact Information Contact Information     Name Relation Home Work Mobile   [place] Son   [phone]   [place], [place]   [phone]   [place] Daughter   [phone]      Other Contacts   None on File    Current Medical History  Patient Admitting Diagnosis: C4 cervical fracture, tetraplegia  History of Present Illness: Carl Durkee Sr. is a 63 y.o. male with a history of GIB Jehovah's witness but has accepted blood product's in the past), ESRD on HD,  OSA, seizure, essential HTN, prior lacunar infarct who has baseline cervical myelopathy who was walking to his car on 05/25/23 when he became suddenly  weak and collapsed.  A code stroke was called. CTH demonstrated an acute left PCA infarct. CT of C-spine revealed a  C4 fracture with severe canal compromise signal abnormality of the cord at that level. Pt with UE>LE weakness on exam and neurology felt he presented more as a spinal cord injury than a stroke. Dr. Jordan Likes was consulted and took the patient to the OR on 4/13 for C3-C5 ACDF. Hgb down to 8.4 post-op. Hgb being monitored, no anticoagulants or platelets due to hx of GIB. CPAP for OSA.   Resumed home Neurontin.   Continue Protonix. Occasional episodes of hypotension with HD, not currently requiring midodrine.  Complete NIHSS TOTAL: 14 Glasgow Coma Scale Score: 15  Patient's medical record from [facility] has been reviewed by the rehabilitation admission coordinator and physician.  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    Parathyroid abnormality (HCC)    ectopic parathyroid gland   Presence of arteriovenous fistula for hemodialysis, primary (HCC)    RUE PER PT RLE   Refusal of blood product    NO WHOLE BLOOD PROUCTS   Renal cell carcinoma (HCC)    s/p hand assisted laparoscopic bilateral nephrectomies 11/29/17, + RCC left   Secondary hyperparathyroidism (HCC)    Seizures (HCC)    one episode in past, due to" elevated Potassium" 08/02/20- "at least 4 years ago"   Sleep apnea    doesn't use CPAP anymore since weight loss   Stroke  [facility])    no residual   Has the patient had major surgery during 100 days prior to admission? Yes  Family History  family history includes Anuerysm in his brother; Diabetes in his father; Hypertension in his father and sister; Lupus in his sister; Stroke in his father and sister; Uterine cancer in his mother.  Current Medications   Current Facility-Administered Medications:    acetaminophen (TYLENOL) tablet 650 mg, 650 mg, Oral, Q4H PRN, 650 mg at 05/30/23 0355 **OR** acetaminophen (TYLENOL) suppository 650 mg, 650 mg, Rectal, Q4H PRN, [name], MD   atorvastatin (LIPITOR) tablet 10 mg, 10 mg, Oral, Q [place] Hammer, MD, 10 mg at 05/30/23 0903   carbamide peroxide (DEBROX) 6.5 % OTIC (EAR) solution 5 drop, 5 drop, Right EAR, BID, [name], [name], MD, 5 drop at 05/30/23 0908   Chlorhexidine Gluconate Cloth 2 % PADS 6 each, 6 each, Topical, Q0600, Collins, Samantha G, PA-C, 6 each at 05/30/23 4098   Chlorhexidine Gluconate Cloth 2 % PADS 6 each, 6 each, Topical, Q0600, [name], MD, 6 each at 05/30/23 0538   cinacalcet (SENSIPAR) tablet 120 mg, 120 mg, Oral, Q supper, [name], MD, 120 mg at 05/29/23 1906   cyclobenzaprine (FLEXERIL) tablet 10 mg, 10 mg, Oral, TID PRN, [name], MD, 10 mg at 05/30/23 0903   gabapentin (NEURONTIN) capsule 300 mg, 300 mg, Oral, QHS, [name], [name], MD, 300 mg at 05/28/23 2151   hydrALAZINE (APRESOLINE) injection 10 mg, 10 mg, Intravenous, Q6H PRN, [name], MD   menthol-cetylpyridinium (CEPACOL) lozenge 3 mg, 1 lozenge, Oral, PRN **OR** phenol (CHLORASEPTIC) mouth spray 1 spray, 1 spray, Mouth/Throat, PRN, Pool, [name], MD   ondansetron (ZOFRAN) tablet 4 mg, 4 mg, Oral, Q6H PRN **OR** ondansetron (ZOFRAN) injection 4 mg, 4 mg, Intravenous, Q6H PRN, Pool, [name], MD   oxyCODONE-acetaminophen (PERCOCET/ROXICET) 5-325 MG per tablet 1-2 tablet, 1-2 tablet, Oral, Q4H PRN, [name], [name], MD, 2 tablet at 05/30/23 0903   pantoprazole  (PROTONIX) EC tablet 40 mg, 40 mg, Oral, BID AC, Pool, [name], MD, 40 mg at 05/30/23 0903   polyethylene glycol (MIRALAX / GLYCOLAX) packet 17 g, 17 g, Oral, BID, Haydee Lipa, MD, 17 g at 05/30/23 0903   povidone-iodine (BETADINE) 7.5 % scrub, , Topical, Once, Magnant, Charles L, PA-C   povidone-iodine 10 % swab 2 Application, 2 Application, Topical, Once, Magnant, Charles L, PA-C   senna-docusate (Senokot-S) tablet 2 tablet, 2 tablet, Oral, BID, Haydee Lipa,  MD, 2 tablet at 05/30/23 0903   sevelamer carbonate (RENVELA) tablet 3,200-4,000 mg, 3,200-4,000 mg, Oral, TID WC, Pool, Sherilyn Cooter, MD, 3,200 mg at 05/29/23 1905   sodium chloride flush (NS) 0.9 % injection 3 mL, 3 mL, Intravenous, Q12H, Pool, Sherilyn Cooter, MD, 3 mL at 05/28/23 2200   sodium chloride flush (NS) 0.9 % injection 3 mL, 3 mL, Intravenous, Q12H, Pool, Sherilyn Cooter, MD, 3 mL at 05/30/23 0905   sodium chloride flush (NS) 0.9 % injection 3 mL, 3 mL, Intravenous, PRN, Julio Sicks, MD   sodium chloride flush (NS) 0.9 % injection 3 mL, 3 mL, Intravenous, Q12H, Pool, Sherilyn Cooter, MD, 3 mL at 05/28/23 2200   sodium chloride flush (NS) 0.9 % injection 3 mL, 3 mL, Intravenous, PRN, Julio Sicks, MD  Patients Current Diet:  Diet Order             Diet - low sodium heart healthy           Diet renal with fluid restriction Fluid restriction: 1200 mL Fluid; Room service appropriate? Yes; Fluid consistency: Thin  Diet effective now                  Precautions / Restrictions Precautions Precautions: Fall, Cervical Precaution Booklet Issued: Yes (comment) Precaution/Restrictions Comments: reviewed precautions Cervical Brace: Hard collar, Other (comment) Restrictions Weight Bearing Restrictions Per Provider Order: No   Has the patient had 2 or more falls or a fall with injury in the past year?Yes  Prior Activity Level Community (5-7x/wk): mod I with cane; more falls noted over past few months  Prior Functional Level Prior  Function Prior Level of Function : Independent/Modified Independent, History of Falls (last six months), Driving Mobility Comments: uses cane for mobility, multiple falls on stairs in last 3 months. ADLs Comments: Indep with ADLs, IADLs. was previously driving to/from HD but due to RLE difficulties has reduced driving  Self Care: Did the patient need help bathing, dressing, using the toilet or eating?  Independent  Indoor Mobility: Did the patient need assistance with walking from room to room (with or without device)? Independent  Stairs: Did the patient need assistance with internal or external stairs (with or without device)? Needed some help  Functional Cognition: Did the patient need help planning regular tasks such as shopping or remembering to take medications? Needed some help  Patient Information Are you of Hispanic, Latino/a,or Spanish origin?: A. No, not of Hispanic, Latino/a, or Spanish origin What is your race?: B. Black or African American Do you need or want an interpreter to communicate with a doctor or health care staff?: 0. No  Patient's Response To:  Health Literacy and Transportation Is the patient able to respond to health literacy and transportation needs?: Yes Health Literacy - How often do you need to have someone help you when you read instructions, pamphlets, or other written material from your doctor or pharmacy?: Never In the past 12 months, has lack of transportation kept you from medical appointments or from getting medications?: No In the past 12 months, has lack of transportation kept you from meetings, work, or from getting things needed for daily living?: No  Home Assistive Devices / Equipment Home Equipment: Cane - single point, Hand held shower head, Other (comment), Toilet riser, Rollator (4 wheels), Rolling Walker (2 wheels), BSC/3in1 (fall mat in tub shower)  Prior Device Use: Indicate devices/aids used by the patient prior to current illness,  exacerbation or injury?  cane  Current Functional Level Cognition  Orientation  Level: Oriented X4    Extremity Assessment (includes Sensation/Coordination)  Upper Extremity Assessment: RUE deficits/detail, LUE deficits/detail RUE Deficits / Details: previously R handed but reports gradual decline in this UE's function. Reports need for shoulder surgery-potentially this year. HD fistula in this UE. able to bend/extend elbow WFL. able to flex shoulder AAROM to 65* but limited to ~20AROM. unable to make fist or extend digits but passively [facility]. Poor wrist control/pronation/supination needed for self feeding. reports sensation in this UE more impaired than LUE RUE Sensation: decreased light touch RUE Coordination: decreased fine motor, decreased gross motor LUE Deficits / Details: pt's now dominant hand per report. able to extend digits, and composite flexion lacks 2cm to distal palmar crease thus unable to make a fist but passively able to stretch. able to hold built up utensil with weak grasp. wrist and elbow AROM WFL. unable to flex this shoulder < 20*- reports this is new since the fall LUE Sensation: decreased light touch LUE Coordination: decreased fine motor, decreased gross motor  Lower Extremity Assessment: Defer to PT evaluation RLE Deficits / Details: 3-/5 for ankle DF, 3+/5 for knee extension, 4/5 for knee flexion, 2-/5 for hip flexion, 3/5 for hip abd/add. pt reports sensation intact, but not able to discern when PT touching toes/foot. 4/5 proprioception testing RLE Sensation: decreased light touch, decreased proprioception RLE Coordination: decreased fine motor, decreased gross motor LLE Deficits / Details: 4-/5 for ankle DF, 4-/5 for knee extension, 4/5 for knee flexion, 3/5 for hip flexion, 4/5 for hip abd/add. pt reports sensation intact LLE Sensation: WNL LLE Coordination: decreased fine motor, decreased gross motor    ADLs  Overall ADL's : Needs  assistance/impaired Eating/Feeding: Minimal assistance, Sitting, With adaptive utensils Eating/Feeding Details (indicate cue type and reason): provided built up grip to use with LUE and pt able to perform with intermittent min A. Attempted to provide ucuff for RUE as pt reported he typically self feeds R handed, but pt with poor motor control at shoulder and with pronation/supination, and shoulder flexion, internal and external rotation to be able to scoop food and bring to mouth, mildly jerky movement also seems more due to weakness than ataxia Grooming: Wash/dry face, Contact guard assist, Supervision/safety, Sitting Grooming Details (indicate cue type and reason): used LUE to wash face while seated Upper Body Bathing: Moderate assistance, Sitting Lower Body Bathing: Maximal assistance, +2 for physical assistance, +2 for safety/equipment, Sitting/lateral leans, Sit to/from stand Upper Body Dressing : Moderate assistance, Sitting Lower Body Dressing: Total assistance, Sitting/lateral leans Lower Body Dressing Details (indicate cue type and reason): to don socks Toilet Transfer: Moderate assistance, +2 for physical assistance, +2 for safety/equipment, Stand-pivot Toilet Transfer Details (indicate cue type and reason): LLE knee block Toileting- Clothing Manipulation and Hygiene: +2 for safety/equipment, +2 for physical assistance, Total assistance, Sit to/from stand, Sitting/lateral lean Functional mobility during ADLs: +2 for safety/equipment, +2 for physical assistance, Moderate assistance General ADL Comments: session focused on improving functional mobility with PT    Mobility  Overal bed mobility: Needs Assistance Bed Mobility: Rolling, Sidelying to Sit Rolling: Contact guard assist Sidelying to sit: Contact guard assist, HOB elevated Sit to supine: +2 for safety/equipment, Min assist, HOB elevated, +2 for physical assistance General bed mobility comments: bed mobility completed with CGA  for safety, required increased time but ultimately able to complete without physical assistance.    Transfers  Overall transfer level: Needs assistance Equipment used: Ambulation equipment used Transfers: Sit to/from Stand Sit to Stand: +2 physical assistance, +  2 safety/equipment, Min assist, Via lift equipment Bed to/from chair/wheelchair/BSC transfer type:: Step pivot Step pivot transfers: Max assist, +2 physical assistance, +2 safety/equipment Transfer via Lift Equipment:  (sara plus) General transfer comment: pt used Abraham Hoffmann plus to assist with standing and required min A +2 to power up, LUE bandaged to forearm support to aid in keeping arm secure and from sliding    Ambulation / Gait / Stairs / Wheelchair Mobility  Ambulation/Gait Ambulation/Gait assistance: +2 physical assistance, +2 safety/equipment, Mod assist (+4) Gait Distance (Feet): 36 Feet (x4 bouts of ~6 ft > ~30 ft > ~36 ft > ~10 ft) Assistive device:  (Sara Plus) Gait Pattern/deviations: Decreased stance time - right, Knees buckling, Decreased step length - right, Decreased dorsiflexion - right, Trunk flexed, Knee flexed in stance - right, Decreased weight shift to right, Decreased weight shift to left General Gait Details: +4 assist needed to guide Abraham Hoffmann Plus, support pt while blocking his R UE inside and on the platform (L UE ACE wrapped to platform for same reason), provide chair follow, and provide support and tactile and verbal cues to weight shift bil, extend knees during stance (R>L), bring feet apart when taking steps (L>R), and advance foot when stepping on R. Pt providing good initiation and active participation of >50% but needs modAx4 for all these components. His ability to lift and swing his R leg and extend his L knee during stance improved as distance progressed. Gait velocity: reduced Gait velocity interpretation: <1.31 ft/sec, indicative of household ambulator Pre-gait activities: Cued pt to step forward then back  with each foot, x >10 reps each leg. Pt needed cues to push through his elbows on the bil platform walker to extend his trunk and stand more upright. Tactile and verbal cues provided to shift weight bil, extend the knee of the leg in stance phase while stepping forward/back with the contralateral leg (buckles more on R than L), and lifting his R leg to clear and take steps. Good carryover noted with faded feedback for these cues. MaxAx2 needed for balance and cuing and assisting R leg > L for swing phase and for knee extension in stance phase.    Posture / Balance Dynamic Sitting Balance Sitting balance - Comments: sat EOB with both extremities used for support, close guard for safety Balance Overall balance assessment: Needs assistance, History of Falls Sitting-balance support: Bilateral upper extremity supported, Feet supported Sitting balance-Leahy Scale: Poor Sitting balance - Comments: sat EOB with both extremities used for support, close guard for safety Postural control: Posterior lean Standing balance support: Bilateral upper extremity supported, During functional activity Standing balance-Leahy Scale: Poor Standing balance comment: reliant on forearm support from sara plus to help support self    Special needs/care consideration Dialysis: Hemodialysis Monday, Wednesday, and Friday and Skin abrasion to head Son drives him to OP dialysis recently due to pt's weakness     Previous Home Environment  Living Arrangements:  (son)  Lives With: Son Available Help at Discharge: Family, Available 24 hours/day Type of Home: Apartment Home Layout: One level Home Access: Stairs to enter Entrance Stairs-Rails: Right, Left Entrance Stairs-Number of Steps:  (second floor apartment) Bathroom Shower/Tub: Engineer, manufacturing systems: Standard Bathroom Accessibility: Yes How Accessible: Accessible via walker Home Care Services: No  Discharge Living Setting Plans for Discharge Living  Setting: Patient's home, Apartment, Lives with (comment) (son) Type of Home at Discharge: Apartment Discharge Home Layout: One level Discharge Home Access: Stairs to enter Entrance Stairs-Rails: Right, Left Entrance  Stairs-Number of Steps: 18 steps; second floor apartment Discharge Bathroom Shower/Tub: Tub/shower unit Discharge Bathroom Toilet: Standard Discharge Bathroom Accessibility: Yes How Accessible: Accessible via walker Does the patient have any problems obtaining your medications?: No  Son is requesting first floor apartment from his complex, can get it end of May. 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  Social/Family/Support Systems Patient Roles: Parent Contact Information: son, Ernestina Headland Anticipated Caregiver: Ernestina Headland and family Anticipated Caregiver's Contact Information: see contacts Ability/Limitations of Caregiver: son works   Medical laboratory scientific officer: 24/7 Discharge Plan Discussed with Primary Caregiver: Yes Is Caregiver In Agreement with Plan?: Yes Does Caregiver/Family have Issues with Lodging/Transportation while Pt is in Rehab?: No  Goals Patient/Family Goal for Rehab: supervision to min with PT and OT Expected length of stay: ELOS 20 to 25 days Pt/Family Agrees to Admission and willing to participate: Yes Program Orientation Provided & Reviewed with Pt/Caregiver Including Roles  & Responsibilities: Yes  Barriers to Discharge: Insurance for SNF coverage  Decrease burden of Care through IP rehab admission: n/a  Possible need for SNF placement upon discharge:not anticipated  Patient Condition: This patient's medical and functional status has not changed since the consult dated 05/28/23 in which the Rehabilitation Physician determined and documented that the patient was appropriate for intensive rehabilitative care in an inpatient rehabilitation facility. Will admit to  inpatient rehab today.   Preadmission Screen Completed By:  Tanya Fantasia, RN MSN 05/30/2023 10:50 AM ______________________________________________________________________   Discussed status with Dr. Dorn Gaskins on 05/30/23 at 1049 and received approval for admission today.  Admission Coordinator:  Tanya Fantasia, RN MSN time [id number] Date 05/30/23

## 2023-05-27 NOTE — Plan of Care (Signed)
  Problem: Education: Goal: Ability to verbalize activity precautions or restrictions will improve Outcome: Progressing Goal: Knowledge of the prescribed therapeutic regimen will improve Outcome: Progressing   Problem: Activity: Goal: Ability to tolerate increased activity will improve Outcome: Progressing Goal: Will remain free from falls Outcome: Progressing   Problem: Clinical Measurements: Goal: Ability to maintain clinical measurements within normal limits will improve Outcome: Progressing Goal: Postoperative complications will be avoided or minimized Outcome: Progressing Goal: Diagnostic test results will improve Outcome: Progressing   Problem: Pain Management: Goal: Pain level will decrease Outcome: Progressing   Problem: Education: Goal: Knowledge of General Education information will improve Description: Including pain rating scale, medication(s)/side effects and non-pharmacologic comfort measures Outcome: Progressing   Problem: Clinical Measurements: Goal: Ability to maintain clinical measurements within normal limits will improve Outcome: Progressing Goal: Will remain free from infection Outcome: Progressing Goal: Diagnostic test results will improve Outcome: Progressing Goal: Respiratory complications will improve Outcome: Progressing Goal: Cardiovascular complication will be avoided Outcome: Progressing   Problem: Nutrition: Goal: Adequate nutrition will be maintained Outcome: Progressing   Problem: Coping: Goal: Level of anxiety will decrease Outcome: Progressing

## 2023-05-27 NOTE — Progress Notes (Signed)
 PT Cancellation Note  Patient Details Name: [name]. MRN: [id number] DOB: Dec 13, 1960   Cancelled Treatment:    Reason Eval/Treat Not Completed: Patient at procedure or test/unavailable, pt at HD this morning. Will continue to follow and progress mobility as time/schedule allows.   Barnabas Booth, PT, DPT   Acute Rehabilitation Department Office [phone] Secure Chat Communication Preferred   Lona Rist 05/27/2023, 11:53 AM

## 2023-05-27 NOTE — Progress Notes (Signed)
 [name] Progress Note  Subjective:  Seen in HD unit No c/o's today, alert  Vitals:   05/27/23 0420 05/27/23 0745 05/27/23 0900 05/27/23 0906  BP: 116/81 (!) 140/79    Pulse: 89 74    Resp: 11 14    Temp: 97.7 F (36.5 C) (!) 97.5 F (36.4 C) 98.5 F (36.9 C)   TempSrc:  Oral Oral   SpO2: 99% 100%    Weight:    87.5 kg  Height:        Exam:  alert, nad , c-collar  no jvd  Chest cta bilat  Cor reg no RG  Abd soft ntnd no ascites   Ext no LE edema   Alert, NF, ox3     RFA AVF+ t/b   OP HD: [facility] MWF 4h  B400   85.5kg   2K bath  AVF Heparin 3000 Mircera 60mcg IV q 2 weeks- last dose 05/24/23 Calcitriol 1.5mcg PO q HD  Renal -related home meds: Sensipar 120 mg at bedtime Lokelma 10 g daily Rena-Vite 1 daily Renvela 3-4 AC 3 times daily Others: Carafate 1 g twice daily as needed, PPI, Percocet, Neurontin, eyedrops, amoxicillin, Lipitor   Assessment/Plan: 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  Mgt per neurosurgery ESRD: HD MWF. HD today per regular schedule HTN/volume: BP controlled, no edema on exam. UF with HD as tolerated tomorrow. Not currently on any antihypertensive meds. 2kg up by wts.  Anemia: Hgb 8- 10.5, ESA recently dosed Metabolic bone disease: Calcium a bit elevated, hold VDRA for now   Nutrition:  On renal diet   Rob Galdino Hinchman MD  [facility] 05/27/2023, 12:47 PM  Recent Labs  Lab 05/25/23 1830 05/25/23 1933 05/27/23 0921  HGB 9.9* 10.5* 8.4*  ALBUMIN 3.2*  --  2.8*  CALCIUM 9.8  --  9.1  CREATININE 7.18* 7.60* 9.65*  K 3.8 3.8 4.5   No results for input(s): "IRON", "TIBC", "FERRITIN" in the last 168 hours. Inpatient medications:  [START ON 05/30/2023] atorvastatin  10 mg Oral Q Thu   Chlorhexidine Gluconate Cloth  6 each Topical Q0600   cinacalcet  120 mg Oral Q supper   gabapentin  300 mg Oral QHS   pantoprazole  40 mg Oral BID AC   povidone-iodine   Topical Once    povidone-iodine  2 Application Topical Once   sevelamer carbonate  3,200-4,000 mg Oral TID WC   sodium chloride flush  3 mL Intravenous Q12H   sodium chloride flush  3 mL Intravenous Q12H   sodium chloride flush  3 mL Intravenous Q12H    anticoagulant sodium citrate     acetaminophen **OR** acetaminophen, alteplase, anticoagulant sodium citrate, cyclobenzaprine, heparin, hydrALAZINE, HYDROcodone-acetaminophen, HYDROcodone-acetaminophen, HYDROmorphone (DILAUDID) injection, lidocaine (PF), lidocaine-prilocaine, menthol-cetylpyridinium **OR** phenol, ondansetron **OR** ondansetron (ZOFRAN) IV, pentafluoroprop-tetrafluoroeth, sodium chloride flush, sodium chloride flush

## 2023-05-27 NOTE — Progress Notes (Signed)
 Received patient in bed.Alert and oriented x 4. Consent verified.  Accessed used : Right AVF that worked well.  Duration of treatment: 4 hours.  UF goal:  Met 2 L  Hand off to the patient's nurse with stable medical condition via transporter.

## 2023-05-27 NOTE — Progress Notes (Signed)
 Overall progressing well.  Pain well-controlled.  Strength a little better in both upper and lower extremities today.  Wound clean and dry.  Continue efforts at therapy and mobilization.

## 2023-05-27 NOTE — Progress Notes (Signed)
 [facility] ROUNDING NOTE Carl Romanoski Sr. HQI:[id number]  DOB: December 30, 1960  DOA: 05/25/2023  PCP: Lanae Pinal, MD  05/27/2023,2:16 PM  LOS: 2 days    Code Status: Full   From:  home   Current Dispo:  unclear   63 year old black male Je[employer]?  Takes blood ESRD MWF HD Prior lacunar infarct Prior upper GI bleed EGD at that time superficial pyloric channel erythema NSAID stopped Recurrent bleed 11/2020 with hiatal hernia colonoscopy poor prep supposed to have repeat scope-readmitted 09/2021 capsule endoscopy negative still had blood loss anemia of unclear cause OSA on CPAP Hypotension in setting of chronic HD  Patient got up from sitting in trailer truck bed took 3 steps and face planted onto the floor unable to get up legs have he was not able to left bump on forehead-CT head showed?  Stroke MRI did not confirm patient had profound weakness lower extremities pins and needle sensation-neurology consulted-felt wasn't CVA Neurosurgery consulted and felt evidence for C4 burst fracture severe canal compromise with pre-existing stenosis Emergently went for anterior cervical corpectomy under Dr. 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 status post corpectomy 4/13 Change Norco to Percocet 1-2 given uncontrolled pain-have asked patient to use as a last resort IV Dilaudid 1 mg every 2 as needed severe pain, can use Flexeril 10 3 times daily spasm Resuming home Neurontin 300 at bedtime  Seems to be a candidate for CIR-hopeful for the same in the next several days  Prior lacunar infarct in the past Not on antiplatelet secondary to risk of bleeding with GI bleed previously  Recurrent prior GI bleed Continue Protonix 40 twice daily, can use Zofran-no antiplatelets Sucralfate should not be used in ESRD--not taking anyway at home  ESRD MWF Continue Sensipar 120, Renvela variable dosing 3 times daily meals Has occasional hypotension with HD-previously on midodrine  now hold Trends to hyperkalemia at times-Lokelma dosing held Contact nephrology for routine dialysis later on today  OSA on CPAP  resume CPAP machine with home settings   No family present  DVT prophylaxis: SCD  Status is: Inpatient Remains inpatient appropriate because:   Requires further management and likely skilled placement  Subjective: Seen at 818 Pain is 6-7/10 Still quite weak in upper extremities left lower extremity is better Otherwise fair  Objective + exam Vitals:   05/27/23 0906 05/27/23 1342 05/27/23 1348 05/27/23 1349  BP:  105/76 114/77   Pulse:      Resp:      Temp:   98 F (36.7 C)   TempSrc:   Oral   SpO2:      Weight: 87.5 kg   85.5 kg  Height:       Filed Weights   05/25/23 1821 05/27/23 0906 05/27/23 1349  Weight: 86.1 kg 87.5 kg 85.5 kg    Examination: EOMI NCAT [place] collar in place sutures/wound not examined Chest clear no added sound Right fistula in place Grip strength left side quite weak right side similarly weak Sensory still diminished in right side power left side is improved however Psych is euthymic   Data Reviewed: reviewed   CBC    Component Value Date/Time   WBC 7.3 05/27/2023 0921   RBC 2.89 (L) 05/27/2023 0921   HGB 8.4 (L) 05/27/2023 0921   HCT 26.1 (L) 05/27/2023 0921   PLT 199 05/27/2023 0921   MCV 90.3 05/27/2023 0921   MCH 29.1 05/27/2023 0921   MCHC 32.2 05/27/2023 0921  RDW 16.2 (H) 05/27/2023 0921   LYMPHSABS 0.7 05/25/2023 1830   MONOABS 0.9 05/25/2023 1830   EOSABS 0.3 05/25/2023 1830   BASOSABS 0.1 05/25/2023 1830      Latest Ref Rng & Units 05/27/2023    9:21 AM 05/25/2023    7:33 PM 05/25/2023    6:30 PM  CMP  Glucose 70 - 99 mg/dL 95  89  161   BUN 8 - 23 mg/dL 54  36  35   Creatinine 0.61 - 1.24 mg/dL 0.96  0.45  4.09   Sodium 135 - 145 mmol/L 136  140  140   Potassium 3.5 - 5.1 mmol/L 4.5  3.8  3.8   Chloride 98 - 111 mmol/L 94  97  96   CO2 22 - 32 mmol/L 27   32   Calcium 8.9 -  10.3 mg/dL 9.1   9.8   Total Protein 6.5 - 8.1 g/dL 6.9   7.4   Total Bilirubin 0.0 - 1.2 mg/dL 0.5   0.6   Alkaline Phos 38 - 126 U/L 71   70   AST 15 - 41 U/L 15   21   ALT 0 - 44 U/L 6   13     Scheduled Meds:  [START ON 05/30/2023] atorvastatin  10 mg Oral Q Thu   Chlorhexidine Gluconate Cloth  6 each Topical Q0600   cinacalcet  120 mg Oral Q supper   gabapentin  300 mg Oral QHS   pantoprazole  40 mg Oral BID AC   povidone-iodine   Topical Once   povidone-iodine  2 Application Topical Once   sevelamer carbonate  3,200-4,000 mg Oral TID WC   sodium chloride flush  3 mL Intravenous Q12H   sodium chloride flush  3 mL Intravenous Q12H   sodium chloride flush  3 mL Intravenous Q12H   Continuous Infusions:  anticoagulant sodium citrate      Time 46  Carl Russ Looper, MD  [facility]

## 2023-05-27 NOTE — Progress Notes (Signed)
 Patient returned to room from HD. Alert and oriented x 4, room air, VS stable.  Patient declines ordering meal for late lunch.

## 2023-05-27 NOTE — Plan of Care (Signed)
  Problem: Education: Goal: Ability to verbalize activity precautions or restrictions will improve Outcome: Progressing Goal: Knowledge of the prescribed therapeutic regimen will improve Outcome: Progressing Goal: Understanding of discharge needs will improve Outcome: Progressing   Problem: Activity: Goal: Ability to avoid complications of mobility impairment will improve Outcome: Progressing Goal: Ability to tolerate increased activity will improve Outcome: Progressing Goal: Will remain free from falls Outcome: Progressing   Problem: Bowel/Gastric: Goal: Gastrointestinal status for postoperative course will improve Outcome: Progressing

## 2023-05-27 NOTE — Progress Notes (Signed)
 Inpatient Rehab Coordinator Note:  I met with patient at bedside, and son, Nilda Barthel by phone to discuss CIR recommendations and goals/expectations of CIR stay.  We reviewed 3 hrs/day of therapy, physician follow up, and average length of stay 2 weeks (dependent upon progress) with goals of min A.  Son is able to provide assistance and lives with patient. They would like to pursue CIR. Will obtain updated notes and submit to insurance.   Rehab Admissons Coordinator Kimbrely Buckel, [place], [place] [phone]

## 2023-05-27 NOTE — Progress Notes (Signed)
 Pt receives out-pt HD at [facility] [place] on MWF 5:25 am chair time. Will assist as needed.   Lauraine Polite Renal Navigator [phone]

## 2023-05-28 DIAGNOSIS — S14109D Unspecified injury at unspecified level of cervical spinal cord, subsequent encounter: Secondary | ICD-10-CM

## 2023-05-28 DIAGNOSIS — S12300G Unspecified displaced fracture of fourth cervical vertebra, subsequent encounter for fracture with delayed healing: Secondary | ICD-10-CM | POA: Diagnosis not present

## 2023-05-28 DIAGNOSIS — S12300A Unspecified displaced fracture of fourth cervical vertebra, initial encounter for closed fracture: Secondary | ICD-10-CM | POA: Diagnosis not present

## 2023-05-28 LAB — HEPATITIS B SURFACE ANTIBODY, QUANTITATIVE: Hep B S AB Quant (Post): 64.1 m[IU]/mL

## 2023-05-28 MED ORDER — CARBAMIDE PEROXIDE 6.5 % OT SOLN
5.0000 [drp] | Freq: Two times a day (BID) | OTIC | Status: DC
  Administered 2023-05-28 – 2023-05-30 (×4): 5 [drp] via OTIC
  Filled 2023-05-28: qty 15

## 2023-05-28 MED ORDER — SENNOSIDES-DOCUSATE SODIUM 8.6-50 MG PO TABS: 2.0000 | ORAL_TABLET | Freq: Two times a day (BID) | ORAL | Status: DC

## 2023-05-28 MED ORDER — SENNOSIDES-DOCUSATE SODIUM 8.6-50 MG PO TABS
2.0000 | ORAL_TABLET | Freq: Two times a day (BID) | ORAL | Status: DC
  Administered 2023-05-28 – 2023-05-29 (×2): 2 via ORAL
  Filled 2023-05-28 (×2): qty 2

## 2023-05-28 MED ORDER — CHLORHEXIDINE GLUCONATE CLOTH 2 % EX PADS
6.0000 | MEDICATED_PAD | Freq: Every day | CUTANEOUS | Status: DC
  Administered 2023-05-29 – 2023-05-30 (×2): 6 via TOPICAL

## 2023-05-28 MED ORDER — POLYETHYLENE GLYCOL 3350 17 G PO PACK
17.0000 g | PACK | Freq: Two times a day (BID) | ORAL | Status: DC
  Administered 2023-05-28 – 2023-05-29 (×2): 17 g via ORAL
  Filled 2023-05-28 (×2): qty 1

## 2023-05-28 MED ORDER — POLYETHYLENE GLYCOL 3350 17 G PO PACK: 17.0000 g | PACK | Freq: Two times a day (BID) | ORAL | Status: DC

## 2023-05-28 NOTE — Progress Notes (Signed)
 Occupational Therapy Treatment Patient Details Name: Carl Age Sr. MRN: [id number] DOB: 01-30-1961 Today's Date: 05/28/2023   History of present illness The pt is a 63 yo male presenting 4/12 after a fall with subsequent difficulty moving legs and hands. 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 PMH includes: recurrent GIB, epilepsy, PRES, TIA, HTN, renal cell carcinoma, CVA, OSA, ESRD, DM II, and Ischemic colitis.   OT comments  Pt progressing toward established OT goals, eager to participate in OT session. Pt continues to benefit from education regarding cervical precautions. Improved sitting balance noted at EOB, but remains unable to achieve figure 4 position for LB ADL. Challenged activity tolerance, transfers, and balance with STS and SPT to chair with up to +2 mod -max A with RLE block during standing tasks and assist for weight shift with steps. Pt with improving AROM of BUE; introduced E for self feeding and pt able to self feed several bites during session. Family in room and supportive during AE education. Due to significant change in functional status, recommending intensive multidisciplinary rehabilitation >3 hours/day to optimize safety and independence in ADL.        If plan is discharge home, recommend the following:  A lot of help with walking and/or transfers;Two people to help with walking and/or transfers;A lot of help with bathing/dressing/bathroom;Two people to help with bathing/dressing/bathroom;Assistance with feeding;Assistance with cooking/housework;Assist for transportation;Help with stairs or ramp for entrance   Equipment Recommendations  Other (comment) (TBD pending progression)    Recommendations for Other Services Rehab consult    Precautions / Restrictions Precautions Precautions: Fall;Cervical Precaution Booklet Issued: Yes (comment) (discussed verbally) Recall of Precautions/Restrictions:  Impaired Precaution/Restrictions Comments: reviewed precautions Required Braces or Orthoses: Cervical Brace Cervical Brace: Hard collar Restrictions Weight Bearing Restrictions Per Provider Order: No       Mobility Bed Mobility Overal bed mobility: Needs Assistance Bed Mobility: Rolling, Sidelying to Sit Rolling: Mod assist, +2 for safety/equipment Sidelying to sit: +2 for safety/equipment, Min assist, HOB elevated   Sit to supine: +2 for safety/equipment, Min assist, HOB elevated, +2 for physical assistance   General bed mobility comments: assist to LE and to trunk, unable to reach with RUE due to chronic shoulder issues, assist to maintain sitting balance    Transfers Overall transfer level: Needs assistance Equipment used: 2 person hand held assist Transfers: Sit to/from Stand, Bed to chair/wheelchair/BSC Sit to Stand: Mod assist, +2 physical assistance, From elevated surface     Step pivot transfers: Max assist, +2 physical assistance, +2 safety/equipment     General transfer comment: sit-stand from elevated EOB modA, blocking of R knee. cues for hip extension and assist at ischial tuberosity to control eccentric lower. Then sit-stand with stand-pivot to recliner with maxA to maintain balance on RLE, assist to wt shift to advance LLE, and continued blocking of R knee.     Balance Overall balance assessment: Needs assistance, History of Falls Sitting-balance support: Bilateral upper extremity supported, Feet supported Sitting balance-Leahy Scale: Poor Sitting balance - Comments: multiple LOB backwards with any LE movement Postural control: Posterior lean Standing balance support: Bilateral upper extremity supported, During functional activity Standing balance-Leahy Scale: Poor Standing balance comment: dependent on external assist                           ADL either performed or assessed with clinical judgement   ADL Overall  ADL's : Needs  assistance/impaired Eating/Feeding: Minimal assistance;Sitting;With adaptive utensils Eating/Feeding Details (indicate cue type and reason): provided built up grip to use with LUE and pt able to perform with intermittent min A. Attempted to provide ucuff for RUE as pt reported he typically self feeds R handed, but pt with poor motor control at shoulder and with pronation/supination, and shoulder flexion, internal and external rotation to be able to scoop food and bring to mouth, mildly jerky movement also seems more due to weakness than ataxia Grooming: Minimal assistance;Sitting;Wash/dry face Grooming Details (indicate cue type and reason): With LUE             Lower Body Dressing: Total assistance;Sitting/lateral leans Lower Body Dressing Details (indicate cue type and reason): to don socks Toilet Transfer: Moderate assistance;+2 for physical assistance;+2 for safety/equipment;Stand-pivot Toilet Transfer Details (indicate cue type and reason): LLE knee block           General ADL Comments: limited by impaired AROM of BUE, decr dexterity, coordination    Extremity/Trunk Assessment Upper Extremity Assessment Upper Extremity Assessment: RUE deficits/detail;LUE deficits/detail RUE Deficits / Details: previously R handed but reports gradual decline in this UE's function. Reports need for shoulder surgery-potentially this year. HD fistula in this UE. able to bend/extend elbow WFL. able to flex shoulder AAROM to 65* but limited to ~20AROM. unable to make fist or extend digits but passively [facility]. Poor wrist control/pronation/supination needed for self feeding. reports sensation in this UE more impaired than LUE RUE Sensation: decreased light touch RUE Coordination: decreased fine motor;decreased gross motor LUE Deficits / Details: pt's now dominant hand per report. able to extend digits, and composite flexion lacks 2cm to distal palmar crease thus unable to make a fist but passively able to  stretch. able to hold built up utensil with weak grasp. wrist and elbow AROM WFL. unable to flex this shoulder < 20*- reports this is new since the fall LUE Sensation: decreased light touch LUE Coordination: decreased fine motor;decreased gross motor   Lower Extremity Assessment Lower Extremity Assessment: Defer to PT evaluation        Vision   Vision Assessment?: No apparent visual deficits   Perception     Praxis     Communication Communication Communication: No apparent difficulties   Cognition Arousal: Alert Behavior During Therapy: WFL for tasks assessed/performed Cognition: Cognition impaired       Memory impairment (select all impairments): Short-term memory     OT - Cognition Comments: pt requires cues to recall and implement cervical precaitions. pleasant and conversational                 Following commands: Intact        Cueing   Cueing Techniques: Verbal cues  Exercises Exercises: General Lower Extremity General Exercises - Lower Extremity Ankle Circles/Pumps: AROM, Both, 10 reps (greater movement with LLE than RLE) Long Arc Quad: AROM, Both, 10 reps, Seated    Shoulder Instructions       General Comments VSS on RA    Pertinent Vitals/ Pain       Pain Assessment Pain Assessment: Faces Faces Pain Scale: Hurts a little bit Pain Location: neck, hands Pain Descriptors / Indicators: Discomfort, Grimacing, Numbness Pain Intervention(s): Limited activity within patient's tolerance, Monitored during session, Repositioned  Home Living   Living Arrangements:  (son) Available Help at Discharge: Family;Available 24 hours/day     Entrance Stairs-Number of Steps:  (second floor apartment)  Bathroom Accessibility: Yes How Accessible: Accessible via walker            Prior Functioning/Environment              Frequency  Min 2X/week        Progress Toward Goals  OT Goals(current goals can now be found in the  care plan section)  Progress towards OT goals: Progressing toward goals  Acute Rehab OT Goals Patient Stated Goal: work with therapy to gain independence OT Goal Formulation: With patient Time For Goal Achievement: 06/09/23 Potential to Achieve Goals: Good ADL Goals Pt Will Perform Eating: with set-up;with adaptive utensils;sitting;bed level Pt Will Perform Grooming: with set-up;with adaptive equipment;bed level;sitting Pt Will Perform Upper Body Bathing: with supervision;sitting Pt Will Transfer to Toilet: with min assist;stand pivot transfer;bedside commode Pt/caregiver will Perform Home Exercise Program: Increased strength;Increased ROM;Both right and left upper extremity;With theraputty;With Supervision;With written HEP provided  Plan      Co-evaluation    PT/OT/SLP Co-Evaluation/Treatment: Yes Reason for Co-Treatment: Complexity of the patient's impairments (multi-system involvement);For patient/therapist safety;To address functional/ADL transfers PT goals addressed during session: Mobility/safety with mobility;Balance;Proper use of DME;Strengthening/ROM OT goals addressed during session: ADL's and self-care;Proper use of Adaptive equipment and DME;Strengthening/ROM      AM-PAC OT "6 Clicks" Daily Activity     Outcome Measure   Help from another person eating meals?: A Lot Help from another person taking care of personal grooming?: A Lot Help from another person toileting, which includes using toliet, bedpan, or urinal?: A Lot Help from another person bathing (including washing, rinsing, drying)?: A Lot Help from another person to put on and taking off regular upper body clothing?: A Lot Help from another person to put on and taking off regular lower body clothing?: Total 6 Click Score: 11    End of Session Equipment Utilized During Treatment: Gait belt;Cervical collar  OT Visit Diagnosis: Unsteadiness on feet (R26.81);Other abnormalities of gait and mobility  (R26.89);Muscle weakness (generalized) (M62.81)   Activity Tolerance Patient tolerated treatment well   Patient Left in chair;with call bell/phone within reach;with family/visitor present   Nurse Communication Mobility status        Time: 4098-1191 OT Time Calculation (min): 42 min  Charges: OT General Charges $OT Visit: 1 Visit OT Treatments $Self Care/Home Management : 8-22 mins $Therapeutic Activity: 8-22 mins  Emery Hans, OTD, OTR/L [facility] Acute Rehabilitation Office: [phone]   Emery Hans 05/28/2023, 3:14 PM

## 2023-05-28 NOTE — Progress Notes (Signed)
 [facility] ROUNDING NOTE Murdock Jellison Sr. ZOX:[id number]  DOB: 1960/12/11  DOA: 05/25/2023  PCP: Darrow Bussing, MD  05/28/2023,11:34 AM  LOS: 3 days    Code Status: Full   From:  home   Current Dispo:  unclear   63 year old black male Jehovah's Witness?  Takes blood ESRD MWF HD Prior lacunar infarct Prior upper GI bleed EGD at that time superficial pyloric channel erythema NSAID stopped Recurrent bleed 11/2020 with hiatal hernia colonoscopy poor prep supposed to have repeat scope-readmitted 09/2021 capsule endoscopy negative still had blood loss anemia of unclear cause OSA on CPAP Hypotension in setting of chronic HD  Patient got up from sitting in trailer truck bed took 3 steps and face planted onto the floor unable to get up legs have he was not able to left bump on forehead-CT head showed?  Stroke MRI did not confirm patient had profound weakness lower extremities pins and needle sensation-neurology consulted-felt wasn't CVA Neurosurgery consulted and felt evidence for C4 burst fracture severe canal compromise with pre-existing stenosis Emergently went for anterior cervical corpectomy under Dr. 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 status post corpectomy 4/13 Percocet 1-2 given uncontrolled pain-have asked patient to use as a last resort IV Dilaudid 1 mg every 2 as needed severe pain, can use Flexeril 10 3 times daily spasm--bracing for foot drop as per PT Resuming home Neurontin 300 at bedtime  Seems to be a candidate for CIR-hopeful for the same in the next several days--auth pending and will be several days  Prior lacunar infarct in the past Not on antiplatelet secondary to risk of bleeding with GI bleed previously  Recurrent prior GI bleed Continue Protonix 40 twice daily, can use Zofran-no antiplatelets Sucralfate not used in ESRD--not taking anyway at home  ESRD MWF Continue Sensipar 120, Renvela variable dosing 3 times daily meals Has occasional  hypotension with HD-previously on midodrine now hold Trends to hyperkalemia at times-Lokelma dosing held Contact nephrology for routine dialysis later on today  OSA on CPAP  resume CPAP machine with home settings   Can come off tele, may be triaged off this unit if bed needed  DVT prophylaxis: SCD  Status is: Inpatient Remains inpatient appropriate because:   Requires further management and likely skilled placement  Subjective:  Well No pain Some ear discomfort No cp  Objective + exam Vitals:   05/27/23 1457 05/27/23 2248 05/28/23 0324 05/28/23 0734  BP: 127/79 122/80 136/81 (!) 142/85  Pulse: 79 78 77 86  Resp: 13 18 11 12   Temp: (!) 97.5 F (36.4 C) 97.6 F (36.4 C) 97.6 F (36.4 C) 97.9 F (36.6 C)  TempSrc: Oral Oral Oral Oral  SpO2: 100% 98% 100% 100%  Weight:      Height:       Filed Weights   05/25/23 1821 05/27/23 0906 05/27/23 1349  Weight: 86.1 kg 87.5 kg 85.5 kg    Examination: EOMI NCAT [place] collar in place sutures/wound not examined L ear has some wax Neck soft S1 S2 no m--seems to be in NSR Abd soft nt nd no rebound Weak in R side arms legs remains, stronger on L   Data Reviewed: reviewed   CBC    Component Value Date/Time   WBC 7.3 05/27/2023 0921   RBC 2.89 (L) 05/27/2023 0921   HGB 8.4 (L) 05/27/2023 0921   HCT 26.1 (L) 05/27/2023 0921   PLT 199 05/27/2023 0921   MCV 90.3 05/27/2023 0921  MCH 29.1 05/27/2023 0921   MCHC 32.2 05/27/2023 0921   RDW 16.2 (H) 05/27/2023 0921   LYMPHSABS 0.7 05/25/2023 1830   MONOABS 0.9 05/25/2023 1830   EOSABS 0.3 05/25/2023 1830   BASOSABS 0.1 05/25/2023 1830      Latest Ref Rng & Units 05/27/2023    9:21 AM 05/25/2023    7:33 PM 05/25/2023    6:30 PM  CMP  Glucose 70 - 99 mg/dL 95  89  161   BUN 8 - 23 mg/dL 54  36  35   Creatinine 0.61 - 1.24 mg/dL 0.96  0.45  4.09   Sodium 135 - 145 mmol/L 136  140  140   Potassium 3.5 - 5.1 mmol/L 4.5  3.8  3.8   Chloride 98 - 111 mmol/L 94  97  96    CO2 22 - 32 mmol/L 27   32   Calcium 8.9 - 10.3 mg/dL 9.1   9.8   Total Protein 6.5 - 8.1 g/dL 6.9   7.4   Total Bilirubin 0.0 - 1.2 mg/dL 0.5   0.6   Alkaline Phos 38 - 126 U/L 71   70   AST 15 - 41 U/L 15   21   ALT 0 - 44 U/L 6   13     Scheduled Meds:  [START ON 05/30/2023] atorvastatin  10 mg Oral Q Thu   carbamide peroxide  5 drop Right EAR BID   Chlorhexidine Gluconate Cloth  6 each Topical Q0600   [START ON 05/29/2023] Chlorhexidine Gluconate Cloth  6 each Topical Q0600   cinacalcet  120 mg Oral Q supper   gabapentin  300 mg Oral QHS   pantoprazole  40 mg Oral BID AC   povidone-iodine   Topical Once   povidone-iodine  2 Application Topical Once   sevelamer carbonate  3,200-4,000 mg Oral TID WC   sodium chloride flush  3 mL Intravenous Q12H   sodium chloride flush  3 mL Intravenous Q12H   sodium chloride flush  3 mL Intravenous Q12H   Continuous Infusions:   Time 26  Verlie Glisson, MD  [facility]

## 2023-05-28 NOTE — Care Management Important Message (Signed)
 Important Message  Patient Details  Name: Carl Bisping Sr. MRN: [id number] Date of Birth: October 04, 1960   Important Message Given:  Yes - Medicare IM     Felix Host 05/28/2023, 1:43 PM

## 2023-05-28 NOTE — Consult Note (Signed)
 Physical Medicine and Rehabilitation Consult Reason for Consult: tetraplegia Referring Physician: Mahala Menghini   HPI: Carl Busby Sr. is a 63 y.o. male with a history of GIB, ESRD on HD who has baseline cervical myelopathy who was walking to his car on 05/25/23 when he became suddenly weak and collapsed. A code stroke was called. CTH demonstrated an acute left PCA infarct. CT of C-spine revealed a  C4 fracture with severe canal compromise signal abnormality of the cord at that level. Pt with UE>LE weakness on exam and neurology felt he presented more as a spinal cord injury than a stroke. Dr. Jordan Likes was consulted and took the patient to the OR on 4/13 for C3-C5 ACDF. Pt has been seen by therapy and as of 4/13 as a max assist for sit-std transfers and  was only able to march in STEDY while OOB. Pt was mod-max assist with ADL's with OT. Hgb down to 8.4 post-op. Hgb being monitored, no anticoagulants or platelets d/t hx of GIB. CPAP for OSA.  Pt had a continent bm last night and foley is in place.    Review of Systems  Constitutional: Negative.   HENT: Negative.    Eyes: Negative.   Respiratory: Negative.    Cardiovascular: Negative.   Gastrointestinal: Negative.   Genitourinary: Negative.   Musculoskeletal:  Positive for joint pain, myalgias and neck pain.  Skin: Negative.   Neurological:  Positive for sensory change, focal weakness and weakness.  Psychiatric/Behavioral:  Negative for depression.    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    Parathyroid abnormality (HCC)    ectopic parathyroid gland   Presence of arteriovenous fistula for hemodialysis, primary (HCC)    RUE PER PT RLE   Refusal of blood product    NO WHOLE BLOOD PROUCTS   Renal cell carcinoma (HCC)    s/p hand assisted laparoscopic bilateral nephrectomies 11/29/17, + RCC left   Secondary hyperparathyroidism (HCC)    Seizures (HCC)    one episode in past, due to" elevated Potassium" 08/02/20- "at least 4 years ago"   Sleep apnea    doesn't use CPAP anymore since weight loss   Stroke [facility])    no residual   Past Surgical History:  Procedure Laterality Date   ANTERIOR CERVICAL CORPECTOMY N/A 05/25/2023   Procedure: ANTERIOR CERVICAL CORPECTOMY CERVICAL FOUR, CERVICAL THREE - CERVICAL FIVE FUSION;  Surgeon: Julio Sicks, MD;  Location: [facility] OR;  Service: Neurosurgery;  Laterality: N/A;   AV FISTULA PLACEMENT Right    right arm   BIOPSY  08/01/2019   Procedure: BIOPSY;  Surgeon: Charna Elizabeth, MD;  Location: [facility] ENDOSCOPY;  Service: Endoscopy;;   BIOPSY  11/18/2020   Procedure: BIOPSY;  Surgeon: Jenel Lucks, MD;  Location: [facility] ENDOSCOPY;  Service: Gastroenterology;;   BIOPSY  09/13/2021   Procedure: BIOPSY;  Surgeon: Normie Becton., MD;  Location: [facility] ENDOSCOPY;  Service: Gastroenterology;;   CATARACT EXTRACTION W/ INTRAOCULAR LENS  IMPLANT, BILATERAL     COLON SURGERY     COLONOSCOPY N/A 08/04/2015   Procedure: COLONOSCOPY;  Surgeon: Nannette Babe, MD;  Location: [facility] ENDOSCOPY;  Service: Endoscopy;  Laterality: N/A;   COLONOSCOPY  2017   JMP@ [facility]-good prep-mass -recall 1 yr   COLONOSCOPY N/A 09/13/2021   Procedure: COLONOSCOPY;  Surgeon: Mansouraty, Albino Alu., MD;  Location: [facility] ENDOSCOPY;  Service: Gastroenterology;  Laterality: N/A;   COLONOSCOPY WITH PROPOFOL N/A 11/25/2020   Procedure: COLONOSCOPY WITH PROPOFOL;  Surgeon: Daina Drum, MD;  Location: [facility]  ENDOSCOPY;  Service: Gastroenterology;  Laterality: N/A;   COLONOSCOPY WITH PROPOFOL N/A 09/23/2021   Procedure: COLONOSCOPY WITH PROPOFOL;  Surgeon: Annis Kinder, DO;  Location: [facility] ENDOSCOPY;  Service: Gastroenterology;  Laterality: N/A;   ENTEROSCOPY N/A 09/23/2021   Procedure: ENTEROSCOPY;  Surgeon: Annis Kinder, DO;  Location: [facility] ENDOSCOPY;  Service: Gastroenterology;  Laterality: N/A;  Will place order for video capsule study as we may opt to place it during procedure   ESOPHAGOGASTRODUODENOSCOPY N/A 08/01/2019   Procedure: ESOPHAGOGASTRODUODENOSCOPY (EGD);  Surgeon: Tami Falcon, MD;  Location: [facility] ENDOSCOPY;  Service: Endoscopy;  Laterality: N/A;   ESOPHAGOGASTRODUODENOSCOPY N/A 11/18/2020   Procedure: ESOPHAGOGASTRODUODENOSCOPY (EGD);  Surgeon: Elois Hair, MD;  Location: [facility] ENDOSCOPY;  Service: Gastroenterology;  Laterality: N/A;   ESOPHAGOGASTRODUODENOSCOPY (EGD) WITH PROPOFOL N/A 08/04/2019   Procedure: ESOPHAGOGASTRODUODENOSCOPY (EGD) WITH PROPOFOL;  Surgeon: Ace Holder, MD;  Location: [facility] ENDOSCOPY;  Service: Gastroenterology;  Laterality: N/A;   ESOPHAGOGASTRODUODENOSCOPY (EGD) WITH PROPOFOL N/A 09/13/2021   Procedure: ESOPHAGOGASTRODUODENOSCOPY (EGD) WITH PROPOFOL;  Surgeon: Brice Campi Albino Alu., MD;  Location: [facility] ENDOSCOPY;  Service: Gastroenterology;  Laterality: N/A;   GIVENS CAPSULE STUDY N/A 09/23/2021   Procedure: GIVENS CAPSULE STUDY;  Surgeon: Annis Kinder, DO;  Location: [facility] ENDOSCOPY;  Service: Gastroenterology;  Laterality: N/A;   graft left arm Left    for dialysis x 2. Removed   HOT HEMOSTASIS  11/18/2020   Procedure: HOT HEMOSTASIS (ARGON PLASMA COAGULATION/BICAP);  Surgeon: Elois Hair, MD;  Location: [facility] ENDOSCOPY;  Service: Gastroenterology;;   HOT HEMOSTASIS N/A 09/23/2021   Procedure: HOT HEMOSTASIS (ARGON PLASMA COAGULATION/BICAP);  Surgeon: Annis Kinder, DO;  Location: [facility] ENDOSCOPY;  Service: Gastroenterology;   Laterality: N/A;   INSERTION OF DIALYSIS CATHETER     Rt chest   LAPAROSCOPIC RIGHT COLECTOMY N/A 08/05/2015   Procedure: LAPAROSCOPIC RIGHT COLECTOMY- ASCENDING;  Surgeon: Lockie Rima, MD;  Location: [facility] OR;  Service: General;  Laterality: N/A;   MASS EXCISION Left 05/28/2019   Procedure: EXCISION SOFT TISSUE MASS LEFT SHOULDER;  Surgeon: Oralee Billow, MD;  Location: [facility] ORS;  Service: General;  Laterality: Left;   NEPHRECTOMY Bilateral    PARATHYROIDECTOMY N/A 06/12/2016   Procedure: TOTAL PARATHYROIDECTOMY WITH AUTOTRANSPLANTATION TO LEFT FOREARM;  Surgeon: Oralee Billow, MD;  Location: [facility] OR;  Service: General;  Laterality: N/A;   PARATHYROIDECTOMY N/A 10/23/2016   Procedure: PARATHYROIDECTOMY;  Surgeon: Oralee Billow, MD;  Location: [facility] OR;  Service: General;  Laterality: N/A;   REVERSE SHOULDER ARTHROPLASTY Right 08/24/2020   Procedure: REVERSE SHOULDER ARTHROPLASTY;  Surgeon: Micheline Ahr, MD;  Location: [facility] OR;  Service: Orthopedics;  Laterality: Right;   REVISON OF ARTERIOVENOUS FISTULA Right 07/16/2017   Procedure: REVISION OF ARTERIOVENOUS FISTULA  Right ARM;  Surgeon: Vikki Graves,  Antony Baumgartner, MD;  Location: [facility] OR;  Service: Vascular;  Laterality: Right;   TOTAL HIP ARTHROPLASTY Left 11/14/2020   Procedure: LEFT TOTAL HIP ARTHROPLASTY ANTERIOR APPROACH;  Surgeon: Wes Hamman, MD;  Location: [facility] OR;  Service: Orthopedics;  Laterality: Left;   UPPER GASTROINTESTINAL ENDOSCOPY  2021   @ [facility]   Family History  Problem Relation Age of Onset   Diabetes Father    Stroke Father    Hypertension Father    Uterine cancer Mother    Lupus Sister    Stroke Sister    Hypertension Sister    Anuerysm Brother        brain   Colon cancer Neg Hx    Esophageal cancer Neg Hx    Stomach cancer Neg Hx    Pancreatic cancer Neg Hx    Liver disease Neg Hx    Colon polyps Neg Hx    Rectal cancer Neg Hx    Social History:  reports that he quit smoking about 32 years ago. His smoking use included  cigarettes. He has never used smokeless tobacco. He reports that he does not currently use alcohol after a past usage of about 1.0 standard drink of alcohol per week. He reports that he does not currently use drugs after having used the following drugs: Marijuana. Allergies:  Allergies  Allergen Reactions   Infed [Iron Dextran] Other (See Comments)    Decreased BP    Vicodin [Hydrocodone-Acetaminophen] Other (See Comments)    Decreased BP   Diclofenac Other (See Comments)    Caused hypotension   Medications Prior to Admission  Medication Sig Dispense Refill   acetaminophen (TYLENOL) 650 MG CR tablet Take 1,300 mg by mouth every 8 (eight) hours as needed for pain.     acetaminophen-codeine (TYLENOL #3) 300-30 MG tablet Take 1 tablet by mouth every 6 (six) hours as needed for moderate pain (pain score 4-6). 30 tablet 0   atorvastatin (LIPITOR) 10 MG tablet Take 10 mg by mouth every Thursday.     augmented betamethasone dipropionate (DIPROLENE-AF) 0.05 % ointment Apply 1 Application topically 2 (two) times daily as needed for rash.     cinacalcet (SENSIPAR) 60 MG tablet Take 120 mg by mouth daily with supper.     cromolyn (OPTICROM) 4 % ophthalmic solution Place 1 drop into both eyes daily as needed (redness).     Docusate Sodium (DSS) 100 MG CAPS Take 100 mg by mouth daily as needed (constipation.).     folic acid (FOLVITE) 1 MG tablet Take 1 tablet (1 mg total) by mouth daily. 30 tablet 0   LOKELMA 10 g PACK packet Take 1 packet by mouth daily.     multivitamin (RENA-VIT) TABS tablet Take 1 tablet by mouth daily.     pantoprazole (PROTONIX) 40 MG tablet Take 1 tablet (40 mg total) by mouth 2 (two) times daily before a meal. (Patient taking differently: Take 40 mg by mouth daily as needed (for acid reflux).) 60 tablet 1   sevelamer carbonate (RENVELA) 800 MG tablet Take 3,200-4,000 mg by mouth 3 (three) times daily with meals.     sucralfate (CARAFATE) 1 GM/10ML suspension Take 10 mLs (1 g  total) by mouth 2 (two) times daily. (Patient taking differently: Take 1 g by mouth 2 (two) times daily as needed (indigestion/upset stomach.).) 420 mL 0   traMADol (ULTRAM) 50 MG tablet TAKE 1 TABLET BY MOUTH EVERY 12 HOURS AS NEEDED 30 tablet 0   vitamin E 180  MG (400 UNITS) capsule Take 400 Units by mouth 2 (two) times a week.     amoxicillin (AMOXIL) 500 MG capsule Take 4 pills one hour prior to dental work (Patient not taking: Reported on 05/06/2023) 12 capsule 3   gabapentin (NEURONTIN) 100 MG capsule Take 300 mg by mouth at bedtime. (Patient not taking: Reported on 05/25/2023)     oxyCODONE-acetaminophen (PERCOCET/ROXICET) 5-325 MG tablet Take 1-2 tablets by mouth every 6 (six) hours as needed. (Patient not taking: Reported on 05/06/2023)     vitamin B-12 (CYANOCOBALAMIN) 1000 MCG tablet Take 1,000 mcg by mouth 2 (two) times a week.      Home: Home Living Family/patient expects to be discharged to:: Skilled nursing facility Living Arrangements: Children Available Help at Discharge: Family Type of Home: Apartment Home Access: Stairs to enter Secretary/administrator of Steps: 18 Entrance Stairs-Rails: Right, Left Home Layout: One level Bathroom Shower/Tub: Engineer, manufacturing systems: Standard Home Equipment: Cane - single point, Hand held shower head, Other (comment), Toilet riser, Rollator (4 wheels), Rolling Walker (2 wheels), BSC/3in1 (fall mat in tub shower)  Lives With: Son  Functional History: Prior Function Prior Level of Function : Independent/Modified Independent, History of Falls (last six months), Driving Mobility Comments: uses cane for mobility, multiple falls on stairs in last 3 months. ADLs Comments: Indep with ADLs, IADLs. was previously driving to/from HD but due to RLE difficulties has reduced driving Functional Status:  Mobility: Bed Mobility Overal bed mobility: Needs Assistance Bed Mobility: Sit to Supine Rolling: Mod assist, +2 for  safety/equipment Sidelying to sit: Mod assist, +2 for safety/equipment Sit to supine: Mod assist, +2 for safety/equipment General bed mobility comments: cues for log roll, but pt returning to supine without sidelying despite cues, needed assist to manage trunk and return LE to bed Transfers Overall transfer level: Needs assistance Equipment used: Ambulation equipment used Transfers: Sit to/from Stand, Bed to chair/wheelchair/BSC Sit to Stand: +2 physical assistance, Max assist Bed to/from chair/wheelchair/BSC transfer type:: Via Lift equipment Transfer via Lift Equipment: [employer] transfer comment: icreased assist to power up from low recliner as pt unable to use arms due to shoulder ROM limitations, use of bed pad under hips to rise, then pt able to steady in standing wth UE support Ambulation/Gait Pre-gait activities: standing marches in stedy, limited lift on each leg, no toe clearance, no buckling    ADL: ADL Overall ADL's : Needs assistance/impaired Eating/Feeding: Maximal assistance, Bed level, Sitting Grooming: Maximal assistance, Sitting, Bed level, Moderate assistance Upper Body Bathing: Moderate assistance, Sitting Lower Body Bathing: Maximal assistance, +2 for physical assistance, +2 for safety/equipment, Sitting/lateral leans, Sit to/from stand Upper Body Dressing : Moderate assistance, Sitting Lower Body Dressing: Total assistance, +2 for physical assistance, +2 for safety/equipment, Sit to/from stand, Sitting/lateral leans Toileting- Clothing Manipulation and Hygiene: +2 for safety/equipment, +2 for physical assistance, Total assistance, Sit to/from stand, Sitting/lateral lean General ADL Comments: Limited by impaired strength and coordination in BUE including ability to grasp items effectively.  Cognition: Cognition Orientation Level: (P) Oriented X4 Cognition Arousal: Alert Behavior During Therapy: WFL for tasks assessed/performed  Blood pressure (!) 142/85,  pulse 86, temperature 97.9 F (36.6 C), temperature source Oral, resp. rate 12, height 6\' 1"  (1.854 m), weight 85.5 kg, SpO2 100%. Physical Exam Constitutional:      General: He is not in acute distress. HENT:     Head: Normocephalic.     Right Ear: External ear normal.     Left Ear: External ear normal.  Mouth/Throat:     Mouth: Mucous membranes are moist.  Eyes:     Pupils: Pupils are equal, round, and reactive to light.  Neck:     Comments: In cervical collar. Incision visible and CDI with steristrips Cardiovascular:     Rate and Rhythm: Normal rate.  Pulmonary:     Effort: Pulmonary effort is normal.  Abdominal:     Palpations: Abdomen is soft.  Musculoskeletal:        General: Tenderness (cervical) present. No swelling.  Skin:    General: Skin is warm.  Neurological:     Mental Status: He is alert.     Comments: Alert and oriented x 3. Normal insight and awareness. Intact Memory. Normal language and speech. Cranial nerve exam unremarkable.  MMT: Right 3+/5 deltoid, 3-4/5 bicep, 2+/5 tricep, 3+/5 wrist extension, 2/5 hand intrinsics.      1/5 hip flexor, 1/5 knee extension, 1/5 ankle dorsiflexion, 1/5 ankle plantarflexion. Left:  3-/5 deltoid, 3/5 bicep, 2-/5 tricep, 3-/5 wrist extension, 1/5 hand intrinsics.      3/5 hip flexor, 3/5 knee extension, 3/5 ankle dorsiflexion, 3/5 ankle plantarflexion. Decreased sensation to LT in both hands. DTR's absent. No abnl resting tone.    Psychiatric:        Mood and Affect: Mood normal.        Behavior: Behavior normal.     No results found for this or any previous visit (from the past 24 hours). No results found. Assessment/Plan: Diagnosis: 63 yo male with C4 ASIA D SCI s/p C3-5 ACDF  Does the need for close, 24 hr/day medical supervision in concert with the patient's rehab needs make it unreasonable for this patient to be served in a less intensive setting? Yes Co-Morbidities requiring supervision/potential complications:   -Pain mgt -ESRD on HD -OSA on CPAP -Acute on chronic anemia, hx of GIB Due to bowel management, safety, skin/wound care, disease management, medication administration, pain management, patient education, and egosupport , does the patient require 24 hr/day rehab nursing? Yes Does the patient require coordinated care of a physician, rehab nurse, therapy disciplines of PT, OT to address physical and functional deficits in the context of the above medical diagnosis(es)? Yes Addressing deficits in the following areas: balance, endurance, locomotion, strength, transferring, bowel/bladder control, bathing, dressing, feeding, grooming, toileting, and psychosocial support Can the patient actively participate in an intensive therapy program of at least 3 hrs of therapy per day at least 5 days per week? Yes The potential for patient to make measurable gains while on inpatient rehab is excellent Anticipated functional outcomes upon discharge from inpatient rehab are supervision and min assist  with PT, supervision, min assist, and mod assist with OT, n/a with SLP. Estimated rehab length of stay to reach the above functional goals is: 20-25 days Anticipated discharge destination: Home Overall Rehab/Functional Prognosis: excellent  POST ACUTE RECOMMENDATIONS: This patient's condition is appropriate for continued rehabilitative care in the following setting: CIR Patient has agreed to participate in recommended program. Yes Note that insurance prior authorization may be required for reimbursement for recommended care.  Comment: Pt is motivated and has good social supports. Family is looking for a first floor apt   MEDICAL RECOMMENDATIONS: Recommend screening LE venous dopplers given that he can't be on anticoagulants.    I have personally performed a face to face diagnostic evaluation of this patient. Additionally, I have examined the patient's medical record including any pertinent labs and radiographic  images.    Thanks,  Aleya Durnell  Azell Leopard, MD 05/28/2023

## 2023-05-28 NOTE — Progress Notes (Signed)
   Providing Compassionate, Quality Care - Together   Subjective: Patient reports his strength and sensation feel like they are improving every day.  Objective: Vital signs in last 24 hours: Temp:  [97.5 F (36.4 C)-98 F (36.7 C)] 97.9 F (36.6 C) (04/15 0734) Pulse Rate:  [77-94] 86 (04/15 0734) Resp:  [11-18] 12 (04/15 0734) BP: (105-142)/(70-85) 142/85 (04/15 0734) SpO2:  [98 %-100 %] 100 % (04/15 0734) Weight:  [85.5 kg] 85.5 kg (04/14 1349)  Intake/Output from previous day: 04/14 0701 - 04/15 0700 In: 50 [IV Piggyback:50] Out: 2000  Intake/Output this shift: No intake/output data recorded.  Alert and oriented PERRLA Speech clear MAE, stronger on the left Incision covered with Honeycomb dressing; Dressing is clean, dry, and intact  Lab Results: Recent Labs    05/25/23 1830 05/25/23 1933 05/27/23 0921  WBC 6.4  --  7.3  HGB 9.9* 10.5* 8.4*  HCT 31.8* 31.0* 26.1*  PLT 203  --  199   BMET Recent Labs    05/25/23 1830 05/25/23 1933 05/27/23 0921  NA 140 140 136  K 3.8 3.8 4.5  CL 96* 97* 94*  CO2 32  --  27  GLUCOSE 107* 89 95  BUN 35* 36* 54*  CREATININE 7.18* 7.60* 9.65*  CALCIUM 9.8  --  9.1    Studies/Results: No results found.  Assessment/Plan: The patient underwent C4 corpectomy followed by C3-5 ACDF by Dr. Adonis Alamin on 05/26/2023. He is progressing well postoperatively.   LOS: 3 days   -Continue to work with therapies. Recommendation is for CIR at discharge.  I am in communication with my attending and they agree with the plan for this patient.   Henreitta Locus, DNP, AGNP-C Nurse Practitioner  [facility] [address], [address], [place], [place] [address] P: [phone]    F: [phone]  05/28/2023, 9:42 AM

## 2023-05-28 NOTE — Progress Notes (Signed)
 [name] Progress Note  Subjective:  Seen in room No c/o's, eating breakfast  Vitals:   05/27/23 1457 05/27/23 2248 05/28/23 0324 05/28/23 0734  BP: 127/79 122/80 136/81 (!) 142/85  Pulse: 79 78 77 86  Resp: 13 18 11 12   Temp: (!) 97.5 F (36.4 C) 97.6 F (36.4 C) 97.6 F (36.4 C) 97.9 F (36.6 C)  TempSrc: Oral Oral Oral Oral  SpO2: 100% 98% 100% 100%  Weight:      Height:        Exam:  alert, nad , c-collar  no jvd  Chest cta bilat  Cor reg no RG  Abd soft ntnd no ascites   Ext no LE edema   Alert, NF, ox3     RFA AVF+ t/b   OP HD: [facility] MWF 4h  B400   85.5kg   2K bath  AVF Heparin 3000 Mircera 60mcg IV q 2 weeks- last dose 05/24/23 Calcitriol 1.5mcg PO q HD  Renal -related home meds: Sensipar 120 mg at bedtime Lokelma 10 g daily Rena-Vite 1 daily Renvela 3-4 AC 3 times daily Others: Carafate 1 g twice daily as needed, PPI, Percocet, Neurontin, eyedrops, amoxicillin, Lipitor   Assessment/Plan: 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  Mgt per neurosurgery ESRD: HD MWF. Has HD here yesterday. Next HD tomorrow.  HTN: BP's stable 125/ 85 range. Not on any HTN meds.  Volume: at dry wt today, euvolemic on exam.  Anemia: Hgb 8- 10.5, ESA recently dosed Metabolic bone disease: Calcium a bit high, holding VDRA for now   Nutrition:  On renal diet   Rob Curtis Cain MD  [facility] 05/28/2023, 11:17 AM  Recent Labs  Lab 05/25/23 1830 05/25/23 1933 05/27/23 0921  HGB 9.9* 10.5* 8.4*  ALBUMIN 3.2*  --  2.8*  CALCIUM 9.8  --  9.1  CREATININE 7.18* 7.60* 9.65*  K 3.8 3.8 4.5   No results for input(s): "IRON", "TIBC", "FERRITIN" in the last 168 hours. Inpatient medications:  [START ON 05/30/2023] atorvastatin  10 mg Oral Q Thu   Chlorhexidine Gluconate Cloth  6 each Topical Q0600   cinacalcet  120 mg Oral Q supper   gabapentin  300 mg Oral QHS   pantoprazole  40 mg Oral BID AC   povidone-iodine    Topical Once   povidone-iodine  2 Application Topical Once   sevelamer carbonate  3,200-4,000 mg Oral TID WC   sodium chloride flush  3 mL Intravenous Q12H   sodium chloride flush  3 mL Intravenous Q12H   sodium chloride flush  3 mL Intravenous Q12H     acetaminophen **OR** acetaminophen, cyclobenzaprine, hydrALAZINE, HYDROmorphone (DILAUDID) injection, menthol-cetylpyridinium **OR** phenol, ondansetron **OR** ondansetron (ZOFRAN) IV, oxyCODONE-acetaminophen, sodium chloride flush, sodium chloride flush

## 2023-05-28 NOTE — Progress Notes (Signed)
 Physical Therapy Treatment Patient Details Name: Carl Inks Sr. MRN: [id number] DOB: Jun 23, 1960 Today's Date: 05/28/2023   History of Present Illness The pt is a 63 yo male presenting 4/12 after a fall with subsequent difficulty moving legs and hands. 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 PMH includes: recurrent GIB, epilepsy, PRES, TIA, HTN, renal cell carcinoma, CVA, OSA, ESRD, DM II, and Ischemic colitis.    PT Comments  The pt was agreeable to session, eager to progress functional strength, mobility, and transfers. He was able to demo improved seated balance this session, but does need cues at times for posture and positioning. He also presents with improved standing tolerance, and was able to initiate lateral stepping today. However, pt with continued significant weakness in BLE (R more impaired than L) with impaired coordination, limited shoulder ROM, and limited functional grasp bilaterally. The pt is highly motivated, has various family members available for assistance, and has had a drastic decline from functional baseline. Pt remains a great candidate for intensive therapies once stable for d/c.     If plan is discharge home, recommend the following: Two people to help with walking and/or transfers;Two people to help with bathing/dressing/bathroom;Assistance with cooking/housework;Assistance with feeding;Direct supervision/assist for medications management;Direct supervision/assist for financial management;Assist for transportation;Help with stairs or ramp for entrance;Supervision due to cognitive status   Can travel by private vehicle        Equipment Recommendations  Wheelchair (measurements PT);Wheelchair cushion (measurements PT);Hoyer lift    Recommendations for [employer] [facility] consult     Precautions / Restrictions Precautions Precautions: Fall;Cervical Precaution Booklet Issued: Yes (comment)  (discussed verbally) Recall of Precautions/Restrictions: Impaired Required Braces or Orthoses: Cervical Brace Cervical Brace: Hard collar Restrictions Weight Bearing Restrictions Per Provider Order: No     Mobility  Bed Mobility Overal bed mobility: Needs Assistance Bed Mobility: Rolling, Sidelying to Sit Rolling: Mod assist, +2 for safety/equipment Sidelying to sit: +2 for safety/equipment, Min assist, HOB elevated   Sit to supine: +2 for safety/equipment, Min assist, HOB elevated, +2 for physical assistance   General bed mobility comments: assist to LE and to trunk, unable to reach with RUE due to chronic shoulder issues, assist to maintain sitting balance    Transfers Overall transfer level: Needs assistance Equipment used: 2 person hand held assist Transfers: Sit to/from Stand, Bed to chair/wheelchair/BSC Sit to Stand: Mod assist, +2 physical assistance, From elevated surface   Step pivot transfers: Max assist, +2 physical assistance, +2 safety/equipment       General transfer comment: sit-stand from elevated EOB modA, blocking of R knee. cues for hip extension and assist at ischial tuberosity to control eccentric lower. Then sit-stand with stand-pivot to recliner with maxA to maintain balance on RLE, assist to wt shift to advance LLE, and continued blocking of R knee.    Ambulation/Gait Ambulation/Gait assistance: Max assist, +2 physical assistance, +2 safety/equipment Gait Distance (Feet): 2 Feet Assistive device: 2 person hand held assist Gait Pattern/deviations: Step-to pattern, Decreased stance time - right, Decreased step length - left, Knees buckling       General Gait Details: buckling R knee, maxA to facilitate wt shift and advance LLE.      Balance Overall balance assessment: Needs assistance, History of Falls Sitting-balance support: Bilateral upper extremity supported, Feet supported Sitting balance-Leahy Scale: Poor Sitting balance - Comments:  multiple LOB backwards with any LE movement Postural control: Posterior lean Standing balance  support: Bilateral upper extremity supported, During functional activity Standing balance-Leahy Scale: Poor Standing balance comment: dependent on external assist                            Communication Communication Communication: No apparent difficulties  Cognition Arousal: Alert Behavior During Therapy: WFL for tasks assessed/performed   PT - Cognitive impairments: No apparent impairments, Awareness, Memory, Problem solving, Safety/Judgement, Attention                       PT - Cognition Comments: poor safety awareness, limited recall of cervical precautions, not formally assessed in this session Following commands: Intact      Cueing Cueing Techniques: Verbal cues  Exercises General Exercises - Lower Extremity Ankle Circles/Pumps: AROM, Both, 10 reps (greater movement with LLE than RLE) Long Arc Quad: AROM, Both, 10 reps, Seated    General Comments General comments (skin integrity, edema, etc.): VSS on RA, admission coordiantor from rehab team present during session      Pertinent Vitals/Pain Pain Assessment Pain Assessment: Faces Faces Pain Scale: Hurts a little bit Pain Location: neck, hands Pain Descriptors / Indicators: Discomfort, Grimacing, Numbness Pain Intervention(s): Limited activity within patient's tolerance, Monitored during session, Repositioned     PT Goals (current goals can now be found in the care plan section) Acute Rehab PT Goals Patient Stated Goal: return to walking without falls, be able to do stairs into apt PT Goal Formulation: With patient Time For Goal Achievement: 06/09/23 Potential to Achieve Goals: Fair Progress towards PT goals: Progressing toward goals    Frequency    Min 3X/week           Co-evaluation   Reason for Co-Treatment: Complexity of the patient's impairments (multi-system involvement);For  patient/therapist safety;To address functional/ADL transfers PT goals addressed during session: Mobility/safety with mobility;Balance;Proper use of DME;Strengthening/ROM        AM-PAC PT "6 Clicks" Mobility   Outcome Measure  Help needed turning from your back to your side while in a flat bed without using bedrails?: A Lot Help needed moving from lying on your back to sitting on the side of a flat bed without using bedrails?: A Lot Help needed moving to and from a bed to a chair (including a wheelchair)?: Total Help needed standing up from a chair using your arms (e.g., wheelchair or bedside chair)?: A Lot Help needed to walk in hospital room?: Total Help needed climbing 3-5 steps with a railing? : Total 6 Click Score: 9    End of Session Equipment Utilized During Treatment: Gait belt;Cervical collar Activity Tolerance: Patient tolerated treatment well Patient left: in chair;with call bell/phone within reach;with chair alarm set;with nursing/sitter in room Nurse Communication: Mobility status PT Visit Diagnosis: Unsteadiness on feet (R26.81);Repeated falls (R29.6);Muscle weakness (generalized) (M62.81)     Time: 6295-2841 PT Time Calculation (min) (ACUTE ONLY): 32 min  Charges:    $Therapeutic Exercise: 8-22 mins PT General Charges $$ ACUTE PT VISIT: 1 Visit                     Vickki Muff, PT, DPT   Acute Rehabilitation Department Office [phone] Secure Chat Communication Preferred   Ronnie Derby 05/28/2023, 1:00 PM

## 2023-05-28 NOTE — Progress Notes (Addendum)
 Inpatient Rehabilitation Admissions Coordinator   I met with patient at bedside. I will follow up with his son, Nilda Barthel, to clarify caregiver supports and question ability for first floor apartment by the time he would possibly discharge from CIR. Rehab MD to consult today and then I will begin Auth with [facility] medicare. Patient states brothers and a sister can assist with his son.  Jeannetta Millman, RN, MSN Rehab Admissions Coordinator [phone] 05/28/2023 11:17 AM  I am to meet son at 3 pm to further discuss rehab.  Jeannetta Millman, RN, MSN Rehab Admissions Coordinator [phone] 05/28/2023 12:14 PM  Met with son, he can arrange 24/7 assist after CIR admit. He also can get first floor apartment at the end of May.  Jeannetta Millman, RN, MSN Rehab Admissions Coordinator [phone] 05/28/2023 4:52 PM

## 2023-05-29 DIAGNOSIS — S12300G Unspecified displaced fracture of fourth cervical vertebra, subsequent encounter for fracture with delayed healing: Secondary | ICD-10-CM | POA: Diagnosis not present

## 2023-05-29 LAB — CBC
HCT: 29.5 % — ABNORMAL LOW (ref 39.0–52.0)
Hemoglobin: 9.3 g/dL — ABNORMAL LOW (ref 13.0–17.0)
MCH: 28.4 pg (ref 26.0–34.0)
MCHC: 31.5 g/dL (ref 30.0–36.0)
MCV: 90.2 fL (ref 80.0–100.0)
Platelets: 266 10*3/uL (ref 150–400)
RBC: 3.27 MIL/uL — ABNORMAL LOW (ref 4.22–5.81)
RDW: 15.9 % — ABNORMAL HIGH (ref 11.5–15.5)
WBC: 8.2 10*3/uL (ref 4.0–10.5)
nRBC: 0 % (ref 0.0–0.2)

## 2023-05-29 LAB — RENAL FUNCTION PANEL
Albumin: 2.9 g/dL — ABNORMAL LOW (ref 3.5–5.0)
Anion gap: 14 (ref 5–15)
BUN: 47 mg/dL — ABNORMAL HIGH (ref 8–23)
CO2: 24 mmol/L (ref 22–32)
Calcium: 8.8 mg/dL — ABNORMAL LOW (ref 8.9–10.3)
Chloride: 92 mmol/L — ABNORMAL LOW (ref 98–111)
Creatinine, Ser: 9.36 mg/dL — ABNORMAL HIGH (ref 0.61–1.24)
GFR, Estimated: 6 mL/min — ABNORMAL LOW (ref >60)
Glucose, Bld: 108 mg/dL — ABNORMAL HIGH (ref 70–99)
Phosphorus: 6.2 mg/dL — ABNORMAL HIGH (ref 2.5–4.6)
Potassium: 5.5 mmol/L — ABNORMAL HIGH (ref 3.5–5.1)
Sodium: 130 mmol/L — ABNORMAL LOW (ref 135–145)

## 2023-05-29 LAB — MRSA NEXT GEN BY PCR, NASAL: MRSA by PCR Next Gen: NOT DETECTED

## 2023-05-29 MED ORDER — HEPARIN SODIUM (PORCINE) 1000 UNIT/ML DIALYSIS
2500.0000 [IU] | Freq: Once | INTRAMUSCULAR | Status: AC
  Administered 2023-05-29: 2500 [IU] via INTRAVENOUS_CENTRAL

## 2023-05-29 MED ORDER — HEPARIN SODIUM (PORCINE) 1000 UNIT/ML DIALYSIS
1000.0000 [IU] | INTRAMUSCULAR | Status: AC | PRN
  Administered 2023-05-29: 1000 [IU] via INTRAVENOUS_CENTRAL

## 2023-05-29 MED ORDER — SENNOSIDES-DOCUSATE SODIUM 8.6-50 MG PO TABS
2.0000 | ORAL_TABLET | Freq: Two times a day (BID) | ORAL | Status: DC
  Administered 2023-05-30: 2 via ORAL
  Filled 2023-05-29: qty 2

## 2023-05-29 MED ORDER — PEG 3350-KCL-NA BICARB-NACL 420 G PO SOLR
4000.0000 mL | Freq: Once | ORAL | Status: AC
  Administered 2023-05-29: 4000 mL via ORAL
  Filled 2023-05-29: qty 4000

## 2023-05-29 MED ORDER — POLYETHYLENE GLYCOL 3350 17 G PO PACK
17.0000 g | PACK | Freq: Two times a day (BID) | ORAL | Status: DC
  Administered 2023-05-30: 17 g via ORAL
  Filled 2023-05-29: qty 1

## 2023-05-29 NOTE — Progress Notes (Signed)
 Inpatient Rehabilitation Admissions Coordinator   I await insurance approval for possible CIR admit.  Jeannetta Millman, RN, MSN Rehab Admissions Coordinator [phone] 05/29/2023 11:37 AM

## 2023-05-29 NOTE — Progress Notes (Signed)
 Overall stable.  Neck pain well-controlled.  No severe radicular pain.  Slowly regaining strength in upper and lower extremities.  Afebrile.  Vital signs are stable.  Wound clean and dry.  Neck soft.  Swallowing well.  Voice strong.  Motor and sensory exam stable.  Still with significant upper and lower extremity weakness but significantly improved from preop.  Overall progressing well.  Continue efforts at rehabilitation.  Okay for inpatient rehab when bed available.

## 2023-05-29 NOTE — Progress Notes (Signed)
 [facility] Progress Note  Subjective:  Seen in room No c/o's  Vitals:   05/28/23 1926 05/28/23 2303 05/29/23 0437 05/29/23 0712  BP: (!) 155/90 (!) 157/85 134/87 (!) 153/84  Pulse: 92 91  86  Resp: 17 15 18 16   Temp: 98.4 F (36.9 C) 98.6 F (37 C) 98.3 F (36.8 C) 98.6 F (37 C)  TempSrc: Oral Oral Oral Oral  SpO2: 99% 99%  100%  Weight:      Height:        Exam:  alert, nad , c-collar  no jvd  Chest cta bilat  Cor reg no RG  Abd soft ntnd no ascites   Ext no LE edema   Alert, NF, ox3     RFA AVF+ t/b   OP HD: [facility] MWF 4h  B400   85.5kg   2K bath  AVF Heparin 3000 Mircera 60mcg IV q 2 weeks- last dose 05/24/23 Calcitriol 1.5mcg PO q HD  Renal -related home meds: Sensipar 120 mg at bedtime Lokelma 10 g daily Rena-Vite 1 daily Renvela 3-4 AC 3 times daily Others: Carafate 1 g twice daily as needed, PPI, Percocet, Neurontin, eyedrops, amoxicillin, Lipitor   Assessment/Plan: 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  Mgt per neurosurgery ESRD: HD MWF. Has HD here yesterday. Next HD today.  HTN: BP's stable 125/ 85 range. Not on any HTN meds.  Volume: at dry wt today, euvolemic on exam.  Anemia: Hgb 8- 10.5, ESA recently dosed Metabolic bone disease: Calcium a bit high, holding VDRA for now   Nutrition:  On renal diet   Larry Poag MD  [facility] 05/29/2023, 4:23 PM  Recent Labs  Lab 05/25/23 1830 05/25/23 1933 05/27/23 0921  HGB 9.9* 10.5* 8.4*  ALBUMIN 3.2*  --  2.8*  CALCIUM 9.8  --  9.1  CREATININE 7.18* 7.60* 9.65*  K 3.8 3.8 4.5   No results for input(s): "IRON", "TIBC", "FERRITIN" in the last 168 hours. Inpatient medications:  [START ON 05/30/2023] atorvastatin  10 mg Oral Q Thu   carbamide peroxide  5 drop Right EAR BID   Chlorhexidine Gluconate Cloth  6 each Topical Q0600   Chlorhexidine Gluconate Cloth  6 each Topical Q0600   cinacalcet  120 mg Oral Q supper   gabapentin  300  mg Oral QHS   pantoprazole  40 mg Oral BID AC   polyethylene glycol  17 g Oral BID   povidone-iodine   Topical Once   povidone-iodine  2 Application Topical Once   senna-docusate  2 tablet Oral BID   sevelamer carbonate  3,200-4,000 mg Oral TID WC   sodium chloride flush  3 mL Intravenous Q12H   sodium chloride flush  3 mL Intravenous Q12H   sodium chloride flush  3 mL Intravenous Q12H     acetaminophen **OR** acetaminophen, cyclobenzaprine, hydrALAZINE, menthol-cetylpyridinium **OR** phenol, ondansetron **OR** ondansetron (ZOFRAN) IV, oxyCODONE-acetaminophen, sodium chloride flush, sodium chloride flush

## 2023-05-29 NOTE — Progress Notes (Signed)
 Physical Therapy Treatment Patient Details Name: Carl Graves Sr. MRN: [id number] DOB: 10-08-1960 Today's Date: 05/29/2023   History of Present Illness The pt is a 63 yo male presenting 4/12 after a fall with subsequent difficulty moving legs and hands. 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 PMH includes: recurrent GIB, epilepsy, PRES, TIA, HTN, renal cell carcinoma, CVA, OSA, ESRD, DM II, and Ischemic colitis.    PT Comments  Focused session on lower extremity strength, transfer, and pre-gait training. The pt continues to demonstrate deficits in bil lower extremity strength (R>L), resulting in him needing assistance to actively lift his R leg into hip flexion during seated exercises. He also continues to demonstrate strength deficits in his bil grip, limiting him from being able to grasp a RW thus attempted to utilize a bil platform RW for improved UE utilization in providing the pt with stability when standing. While it did appear to give him better control, the platforms often would eventually rotate inward, impacting his ease of use of the device. He required modAx2 to transfer to stand each rep, needing cuing for scooting to the edge, hand placement, and feet placement. Performed pre-gait training through cuing pt to step forward <> backward in place repeatedly, > x10 reps each foot. He needed verbal and tactile cues for bil weight shifting, extending his knees during stance phase (R buckles more than L), and lifting and swinging his R leg to take a step. He demonstrated good carryover of these cues with faded feedback but continued to particularly have difficulty multi-tasking taking a step with the L while maintaining quad contraction on the R. The pt may benefit from utilizing the [facility] for better stability and support during future gait training bouts. Will continue to follow acutely.   If plan is discharge home, recommend  the following: Two people to help with walking and/or transfers;Two people to help with bathing/dressing/bathroom;Assistance with cooking/housework;Assistance with feeding;Direct supervision/assist for medications management;Direct supervision/assist for financial management;Assist for transportation;Help with stairs or ramp for entrance;Supervision due to cognitive status   Can travel by private vehicle        Equipment Recommendations  Wheelchair (measurements PT);Wheelchair cushion (measurements PT);Hoyer IT consultant for [employer] [facility] consult     Precautions / Restrictions Precautions Precautions: Fall;Cervical Precaution Booklet Issued: Yes (comment) Recall of Precautions/Restrictions: Impaired Required Braces or Orthoses: Cervical Brace Cervical Brace: Hard collar;Other (comment) (Orders to apply/remove brace in sitting, may remove when in bed and to shower and may ambulate to bathroom without brace) Restrictions Weight Bearing Restrictions Per Provider Order: No     Mobility  Bed Mobility               General bed mobility comments: Pt sitting up in chair upon arrival and at end of session.    Transfers Overall transfer level: Needs assistance Equipment used: Bilateral platform walker Transfers: Sit to/from Stand Sit to Stand: Mod assist, +2 physical assistance, +2 safety/equipment           General transfer comment: Cued pt to scoot to edge of seat prior to attempting transfers. Bil knees blocked and pt pushing up from bil armrests of recliner as cued to stand up, x2 reps. ModAx2 needed to power pt up to stand and facilitate hip extension with use of bed pad. Once standing, brought bil platform walker proximal to pt and placed each of his UEs on the platforms,  cuing pt to push through his UEs and look up to improve his posture.    Ambulation/Gait Ambulation/Gait assistance: Max assist, +2 physical assistance, +2 safety/equipment   Assistive  device: Bilateral platform walker Gait Pattern/deviations: Decreased stance time - right, Knees buckling, Decreased step length - right, Decreased dorsiflexion - right, Trunk flexed, Knee flexed in stance - right     Pre-gait activities: Cued pt to step forward then back with each foot, x >10 reps each leg. Pt needed cues to push through his elbows on the bil platform walker to extend his trunk and stand more upright. Tactile and verbal cues provided to shift weight bil, extend the knee of the leg in stance phase while stepping forward/back with the contralateral leg (buckles more on R than L), and lifting his R leg to clear and take steps. Good carryover noted with faded feedback for these cues. MaxAx2 needed for balance and cuing and assisting R leg > L for swing phase and for knee extension in stance phase.     Stairs             Wheelchair Mobility     Tilt Bed    Modified Rankin (Stroke Patients Only)       Balance Overall balance assessment: Needs assistance, History of Falls Sitting-balance support: Bilateral upper extremity supported, Feet supported Sitting balance-Leahy Scale: Poor Sitting balance - Comments: Pt using bil UEs to lean anteriorly in recliner. Postural control: Posterior lean Standing balance support: Bilateral upper extremity supported, During functional activity Standing balance-Leahy Scale: Poor Standing balance comment: reliant on bil UE support and modAx2 for static standing balance and R knee extension                            Communication Communication Communication: No apparent difficulties  Cognition Arousal: Alert Behavior During Therapy: WFL for tasks assessed/performed   PT - Cognitive impairments: Awareness, Memory, Problem solving, Safety/Judgement, Attention                       PT - Cognition Comments: Needs cues repeated at times, but pt demonstrated some carryover with repetition. Following commands:  Intact      Cueing Cueing Techniques: Verbal cues  Exercises General Exercises - Lower Extremity Ankle Circles/Pumps: AROM, Both, Other reps (comment), Seated, 5 reps Long Arc Quad: AROM, Both, 10 reps, Seated, Strengthening Hip Flexion/Marching: AAROM, AROM, Strengthening, Both, 10 reps, Seated (AAROM on R, AROM on L)    General Comments        Pertinent Vitals/Pain Pain Assessment Pain Assessment: Faces Faces Pain Scale: Hurts little more Pain Location: neck Pain Descriptors / Indicators: Discomfort, Grimacing, Guarding, Operative site guarding Pain Intervention(s): Limited activity within patient's tolerance, Monitored during session, Repositioned    Home Living                          Prior Function            PT Goals (current goals can now be found in the care plan section) Acute Rehab PT Goals Patient Stated Goal: return to walking without falls, be able to do stairs into apt PT Goal Formulation: With patient Time For Goal Achievement: 06/09/23 Potential to Achieve Goals: Fair Progress towards PT goals: Progressing toward goals    Frequency    Min 3X/week      PT Plan  Co-evaluation              AM-PAC PT "6 Clicks" Mobility   Outcome Measure  Help needed turning from your back to your side while in a flat bed without using bedrails?: A Lot Help needed moving from lying on your back to sitting on the side of a flat bed without using bedrails?: A Lot Help needed moving to and from a bed to a chair (including a wheelchair)?: Total Help needed standing up from a chair using your arms (e.g., wheelchair or bedside chair)?: Total Help needed to walk in hospital room?: Total Help needed climbing 3-5 steps with a railing? : Total 6 Click Score: 8    End of Session Equipment Utilized During Treatment: Gait belt;Cervical collar Activity Tolerance: Patient tolerated treatment well Patient left: in chair;with call bell/phone within  reach;with chair alarm set Nurse Communication: Mobility status PT Visit Diagnosis: Unsteadiness on feet (R26.81);Repeated falls (R29.6);Muscle weakness (generalized) (M62.81)     Time: 1610-9604 PT Time Calculation (min) (ACUTE ONLY): 46 min  Charges:    $Gait Training: 8-22 mins $Therapeutic Exercise: 8-22 mins $Therapeutic Activity: 8-22 mins PT General Charges $$ ACUTE PT VISIT: 1 Visit                     Vernida Goodie, PT, DPT Acute Rehabilitation Services  Office: [phone]    Ellyn Hack 05/29/2023, 5:39 PM

## 2023-05-29 NOTE — Progress Notes (Signed)
 PROGRESS NOTE    Carl Borum Sr.  QMV:[id number] DOB: 23-Aug-1960 DOA: 05/25/2023 PCP: Darrow Bussing, MD   Brief Narrative:  Carl Bowens Sr. is a 63 y.o. 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   Patient found to have C4 fracture - repaired 4/13 with Dr. Dutch Quint.  Tolerated procedure quite well.  Continued PT OT evaluation as clinically improves he will likely be discharged to skilled care, likely inpatient rehab, given ongoing needs.   Assessment & Plan:   Principal Problem:   C4 cervical fracture (HCC) Active Problems:   Cervical vertebral fracture (HCC)   GERD (gastroesophageal reflux disease)   History of CVA (cerebrovascular accident)   Anemia of chronic disease   Essential hypertension   Seizure (HCC)   ESRD (end stage renal disease) (HCC)   Obstructive sleep apnea   History of GI bleed    C4 fracture with incomplete spinal cord injury status post corpectomy 4/13 -Tolerated surgery quite well, wean narcotics as appropriate Resuming home Neurontin 300 at bedtime  -PT OT continue to follow, plan for discharge to inpatient rehab pending authorization   Prior lacunar infarct in the past Not on antiplatelet secondary to risk of bleeding with GI bleed previously   Recurrent prior GI bleed Continue Protonix 40 twice daily, can use Zofran-no antiplatelets Sucralfate not used in ESRD--not taking anyway at home   ESRD MWF Nephrology following, appreciate insight recommendations Continue Sensipar 120, Renvela variable dosing 3 times daily meals Occasional episodes of hypotension with HD -not currently requiring midodrine   OSA on CPAP  resume CPAP machine  with home settings    DVT prophylaxis: Sequential compression device to OR Start: 05/26/23 0446 SCDs Start: 05/25/23 2328 Place TED hose Start: 05/25/23 2328   Code Status:   Code Status: Full Code  Family Communication: None present  Status is: Inpatient  Dispo: The patient is from: Home              Anticipated d/c is to: Inpatient rehab              Anticipated d/c date is: Imminent              Patient currently is medically stable for discharge  Consultants:  Neurosurgery, nephrology  Procedures:  C4 corpectomy 4/13  Antimicrobials:  Perioperatively  Subjective: No acute issues or events overnight denies nausea vomiting diarrhea constipation headache fevers chills chest pain  Objective: Vitals:   05/28/23 1926 05/28/23 2303 05/29/23 0437 05/29/23 0712  BP: (!) 155/90 (!) 157/85 134/87 (!) 153/84  Pulse: 92 91  86  Resp: 17 15 18 16   Temp: 98.4 F (36.9 C) 98.6 F (37 C) 98.3 F (36.8 C) 98.6 F (37 C)  TempSrc: Oral Oral Oral Oral  SpO2: 99% 99%  100%  Weight:      Height:        Intake/Output Summary (Last 24 hours) at 05/29/2023 1443 Last data filed at 05/28/2023 2305 Gross per 24 hour  Intake 100 ml  Output --  Net 100 ml   Filed Weights   05/25/23 1821 05/27/23 0906 05/27/23 1349  Weight: 86.1 kg 87.5 kg 85.5 kg    Examination:  General:  Pleasantly resting in bed, No acute distress. HEENT:  Normocephalic  atraumatic.  Sclerae nonicteric, noninjected.  Extraocular movements intact bilaterally. Neck:  Without mass or deformity.  Trachea is midline. Lungs:  Clear to auscultate bilaterally without rhonchi, wheeze, or rales. Heart:  Regular rate and rhythm.  Without murmurs, rubs, or gallops. Abdomen:  Soft, nontender, nondistended.  Without guarding or rebound. Extremities: Decreased range of motion and diminished strength bilaterally in both upper and lower extremities 3/5 globally   Data Reviewed: I have personally reviewed following labs  and imaging studies  CBC: Recent Labs  Lab 05/25/23 1830 05/25/23 1933 05/27/23 0921  WBC 6.4  --  7.3  NEUTROABS 4.4  --   --   HGB 9.9* 10.5* 8.4*  HCT 31.8* 31.0* 26.1*  MCV 92.2  --  90.3  PLT 203  --  199   Basic Metabolic Panel: Recent Labs  Lab 05/25/23 1830 05/25/23 1933 05/27/23 0921  NA 140 140 136  K 3.8 3.8 4.5  CL 96* 97* 94*  CO2 32  --  27  GLUCOSE 107* 89 95  BUN 35* 36* 54*  CREATININE 7.18* 7.60* 9.65*  CALCIUM 9.8  --  9.1   GFR: Estimated Creatinine Clearance: 8.7 mL/min (A) (by C-G formula based on SCr of 9.65 mg/dL (H)). Liver Function Tests: Recent Labs  Lab 05/25/23 1830 05/27/23 0921  AST 21 15  ALT 13 6  ALKPHOS 70 71  BILITOT 0.6 0.5  PROT 7.4 6.9  ALBUMIN 3.2* 2.8*   Coagulation Profile: Recent Labs  Lab 05/25/23 1830 05/27/23 0921  INR 1.1 1.1   CBG: Recent Labs  Lab 05/25/23 1904 05/26/23 0344  GLUCAP 81 90   No results found for this or any previous visit (from the past 240 hours).   Radiology Studies: No results found.  Scheduled Meds:  [START ON 05/30/2023] atorvastatin  10 mg Oral Q Thu   carbamide peroxide  5 drop Right EAR BID   Chlorhexidine Gluconate Cloth  6 each Topical Q0600   Chlorhexidine Gluconate Cloth  6 each Topical Q0600   cinacalcet  120 mg Oral Q supper   gabapentin  300 mg Oral QHS   pantoprazole  40 mg Oral BID AC   polyethylene glycol  17 g Oral BID   polyethylene glycol-electrolytes  4,000 mL Oral Once   povidone-iodine   Topical Once   povidone-iodine  2 Application Topical Once   senna-docusate  2 tablet Oral BID   sevelamer carbonate  3,200-4,000 mg Oral TID WC   sodium chloride flush  3 mL Intravenous Q12H   sodium chloride flush  3 mL Intravenous Q12H   sodium chloride flush  3 mL Intravenous Q12H   Continuous Infusions:   LOS: 4 days   Time spent:  Haydee Lipa, DO [facility]  If 7PM-7AM, please contact night-coverage [email or web address]  05/29/2023,  2:43 PM

## 2023-05-30 ENCOUNTER — Inpatient Hospital Stay (HOSPITAL_COMMUNITY)
Admission: AD | Admit: 2023-05-30 | Discharge: 2023-07-15 | DRG: 091 | Disposition: A | Discharge status: Skilled Nursing Facility | Source: Intra-hospital | Attending: Physical Medicine and Rehabilitation | Admitting: Physical Medicine and Rehabilitation

## 2023-05-30 ENCOUNTER — Other Ambulatory Visit (HOSPITAL_COMMUNITY): Payer: Self-pay

## 2023-05-30 ENCOUNTER — Other Ambulatory Visit: Payer: Self-pay

## 2023-05-30 ENCOUNTER — Encounter (HOSPITAL_COMMUNITY): Payer: Self-pay | Admitting: Physical Medicine and Rehabilitation

## 2023-05-30 ENCOUNTER — Inpatient Hospital Stay (HOSPITAL_COMMUNITY)

## 2023-05-30 DIAGNOSIS — I959 Hypotension, unspecified: Secondary | ICD-10-CM | POA: Diagnosis not present

## 2023-05-30 DIAGNOSIS — R509 Fever, unspecified: Secondary | ICD-10-CM | POA: Diagnosis not present

## 2023-05-30 DIAGNOSIS — I12 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 stage renal disease: Secondary | ICD-10-CM | POA: Diagnosis present

## 2023-05-30 DIAGNOSIS — Z634 Disappearance and death of family member: Secondary | ICD-10-CM

## 2023-05-30 DIAGNOSIS — N2581 Secondary hyperparathyroidism of renal origin: Secondary | ICD-10-CM | POA: Diagnosis not present

## 2023-05-30 DIAGNOSIS — G4733 Obstructive sleep apnea (adult) (pediatric): Secondary | ICD-10-CM | POA: Diagnosis not present

## 2023-05-30 DIAGNOSIS — S14109A Unspecified injury at unspecified level of cervical spinal cord, initial encounter: Secondary | ICD-10-CM | POA: Diagnosis not present

## 2023-05-30 DIAGNOSIS — R34 Anuria and oliguria: Secondary | ICD-10-CM | POA: Diagnosis not present

## 2023-05-30 DIAGNOSIS — M21371 Foot drop, right foot: Secondary | ICD-10-CM | POA: Diagnosis present

## 2023-05-30 DIAGNOSIS — M1611 Unilateral primary osteoarthritis, right hip: Secondary | ICD-10-CM | POA: Diagnosis present

## 2023-05-30 DIAGNOSIS — Z96642 Presence of left artificial hip joint: Secondary | ICD-10-CM | POA: Diagnosis not present

## 2023-05-30 DIAGNOSIS — S14109S Unspecified injury at unspecified level of cervical spinal cord, sequela: Secondary | ICD-10-CM | POA: Diagnosis not present

## 2023-05-30 DIAGNOSIS — Z4789 Encounter for other orthopedic aftercare: Secondary | ICD-10-CM

## 2023-05-30 DIAGNOSIS — K5900 Constipation, unspecified: Secondary | ICD-10-CM | POA: Diagnosis not present

## 2023-05-30 DIAGNOSIS — E875 Hyperkalemia: Secondary | ICD-10-CM | POA: Diagnosis present

## 2023-05-30 DIAGNOSIS — Z7682 Awaiting organ transplant status: Secondary | ICD-10-CM

## 2023-05-30 DIAGNOSIS — I69322 Dysarthria following cerebral infarction: Secondary | ICD-10-CM

## 2023-05-30 DIAGNOSIS — Z8249 Family history of ischemic heart disease and other diseases of the circulatory system: Secondary | ICD-10-CM

## 2023-05-30 DIAGNOSIS — Z743 Need for continuous supervision: Secondary | ICD-10-CM | POA: Diagnosis not present

## 2023-05-30 DIAGNOSIS — M7061 Trochanteric bursitis, right hip: Secondary | ICD-10-CM | POA: Diagnosis not present

## 2023-05-30 DIAGNOSIS — N186 End stage renal disease: Secondary | ICD-10-CM | POA: Diagnosis not present

## 2023-05-30 DIAGNOSIS — R2689 Other abnormalities of gait and mobility: Secondary | ICD-10-CM | POA: Diagnosis not present

## 2023-05-30 DIAGNOSIS — I9589 Other hypotension: Secondary | ICD-10-CM | POA: Diagnosis not present

## 2023-05-30 DIAGNOSIS — S14154S Other incomplete lesion at C4 level of cervical spinal cord, sequela: Secondary | ICD-10-CM

## 2023-05-30 DIAGNOSIS — E8770 Fluid overload, unspecified: Secondary | ICD-10-CM | POA: Diagnosis not present

## 2023-05-30 DIAGNOSIS — R609 Edema, unspecified: Secondary | ICD-10-CM | POA: Diagnosis not present

## 2023-05-30 DIAGNOSIS — Z885 Allergy status to narcotic agent status: Secondary | ICD-10-CM

## 2023-05-30 DIAGNOSIS — K592 Neurogenic bowel, not elsewhere classified: Secondary | ICD-10-CM | POA: Diagnosis not present

## 2023-05-30 DIAGNOSIS — M25569 Pain in unspecified knee: Secondary | ICD-10-CM | POA: Diagnosis not present

## 2023-05-30 DIAGNOSIS — K5901 Slow transit constipation: Secondary | ICD-10-CM | POA: Diagnosis not present

## 2023-05-30 DIAGNOSIS — M25461 Effusion, right knee: Secondary | ICD-10-CM | POA: Diagnosis not present

## 2023-05-30 DIAGNOSIS — G8929 Other chronic pain: Secondary | ICD-10-CM | POA: Diagnosis not present

## 2023-05-30 DIAGNOSIS — E1151 Type 2 diabetes mellitus with diabetic peripheral angiopathy without gangrene: Secondary | ICD-10-CM | POA: Diagnosis present

## 2023-05-30 DIAGNOSIS — R6510 Systemic inflammatory response syndrome (SIRS) of non-infectious origin without acute organ dysfunction: Secondary | ICD-10-CM | POA: Diagnosis not present

## 2023-05-30 DIAGNOSIS — W1839XS Other fall on same level, sequela: Secondary | ICD-10-CM | POA: Diagnosis not present

## 2023-05-30 DIAGNOSIS — R41840 Attention and concentration deficit: Secondary | ICD-10-CM | POA: Diagnosis not present

## 2023-05-30 DIAGNOSIS — M25562 Pain in left knee: Secondary | ICD-10-CM | POA: Diagnosis not present

## 2023-05-30 DIAGNOSIS — S12300A Unspecified displaced fracture of fourth cervical vertebra, initial encounter for closed fracture: Secondary | ICD-10-CM | POA: Diagnosis not present

## 2023-05-30 DIAGNOSIS — M7989 Other specified soft tissue disorders: Secondary | ICD-10-CM | POA: Diagnosis not present

## 2023-05-30 DIAGNOSIS — M159 Polyosteoarthritis, unspecified: Secondary | ICD-10-CM | POA: Diagnosis not present

## 2023-05-30 DIAGNOSIS — D631 Anemia in chronic kidney disease: Secondary | ICD-10-CM | POA: Diagnosis not present

## 2023-05-30 DIAGNOSIS — Z833 Family history of diabetes mellitus: Secondary | ICD-10-CM

## 2023-05-30 DIAGNOSIS — Z96611 Presence of right artificial shoulder joint: Secondary | ICD-10-CM | POA: Diagnosis present

## 2023-05-30 DIAGNOSIS — S14104A Unspecified injury at C4 level of cervical spinal cord, initial encounter: Secondary | ICD-10-CM | POA: Diagnosis not present

## 2023-05-30 DIAGNOSIS — R531 Weakness: Secondary | ICD-10-CM | POA: Diagnosis not present

## 2023-05-30 DIAGNOSIS — Z992 Dependence on renal dialysis: Secondary | ICD-10-CM

## 2023-05-30 DIAGNOSIS — S14109D Unspecified injury at unspecified level of cervical spinal cord, subsequent encounter: Secondary | ICD-10-CM | POA: Diagnosis not present

## 2023-05-30 DIAGNOSIS — Z85528 Personal history of other malignant neoplasm of kidney: Secondary | ICD-10-CM

## 2023-05-30 DIAGNOSIS — I10 Essential (primary) hypertension: Secondary | ICD-10-CM | POA: Diagnosis not present

## 2023-05-30 DIAGNOSIS — Z5320 Procedure and treatment not carried out because of patient's decision for unspecified reasons: Secondary | ICD-10-CM | POA: Diagnosis not present

## 2023-05-30 DIAGNOSIS — N250 Renal osteodystrophy: Secondary | ICD-10-CM | POA: Diagnosis not present

## 2023-05-30 DIAGNOSIS — G9389 Other specified disorders of brain: Secondary | ICD-10-CM | POA: Diagnosis not present

## 2023-05-30 DIAGNOSIS — Z888 Allergy status to other drugs, medicaments and biological substances status: Secondary | ICD-10-CM

## 2023-05-30 DIAGNOSIS — R490 Dysphonia: Secondary | ICD-10-CM | POA: Diagnosis not present

## 2023-05-30 DIAGNOSIS — Z905 Acquired absence of kidney: Secondary | ICD-10-CM

## 2023-05-30 DIAGNOSIS — M25551 Pain in right hip: Secondary | ICD-10-CM | POA: Diagnosis not present

## 2023-05-30 DIAGNOSIS — I951 Orthostatic hypotension: Secondary | ICD-10-CM | POA: Diagnosis not present

## 2023-05-30 DIAGNOSIS — M6281 Muscle weakness (generalized): Secondary | ICD-10-CM | POA: Diagnosis not present

## 2023-05-30 DIAGNOSIS — D509 Iron deficiency anemia, unspecified: Secondary | ICD-10-CM | POA: Diagnosis present

## 2023-05-30 DIAGNOSIS — E1169 Type 2 diabetes mellitus with other specified complication: Secondary | ICD-10-CM | POA: Diagnosis present

## 2023-05-30 DIAGNOSIS — S12300G Unspecified displaced fracture of fourth cervical vertebra, subsequent encounter for fracture with delayed healing: Secondary | ICD-10-CM | POA: Diagnosis not present

## 2023-05-30 DIAGNOSIS — R41844 Frontal lobe and executive function deficit: Secondary | ICD-10-CM | POA: Diagnosis not present

## 2023-05-30 DIAGNOSIS — S12300D Unspecified displaced fracture of fourth cervical vertebra, subsequent encounter for fracture with routine healing: Secondary | ICD-10-CM | POA: Diagnosis not present

## 2023-05-30 DIAGNOSIS — R220 Localized swelling, mass and lump, head: Secondary | ICD-10-CM | POA: Diagnosis not present

## 2023-05-30 DIAGNOSIS — M8580 Other specified disorders of bone density and structure, unspecified site: Secondary | ICD-10-CM | POA: Diagnosis not present

## 2023-05-30 DIAGNOSIS — M25462 Effusion, left knee: Secondary | ICD-10-CM | POA: Diagnosis not present

## 2023-05-30 DIAGNOSIS — F54 Psychological and behavioral factors associated with disorders or diseases classified elsewhere: Secondary | ICD-10-CM

## 2023-05-30 DIAGNOSIS — Z79899 Other long term (current) drug therapy: Secondary | ICD-10-CM

## 2023-05-30 DIAGNOSIS — Z7401 Bed confinement status: Secondary | ICD-10-CM | POA: Diagnosis not present

## 2023-05-30 DIAGNOSIS — R519 Headache, unspecified: Secondary | ICD-10-CM | POA: Diagnosis present

## 2023-05-30 DIAGNOSIS — R41841 Cognitive communication deficit: Secondary | ICD-10-CM | POA: Diagnosis not present

## 2023-05-30 DIAGNOSIS — M898X9 Other specified disorders of bone, unspecified site: Secondary | ICD-10-CM | POA: Diagnosis present

## 2023-05-30 DIAGNOSIS — R2681 Unsteadiness on feet: Secondary | ICD-10-CM | POA: Diagnosis not present

## 2023-05-30 DIAGNOSIS — M25561 Pain in right knee: Secondary | ICD-10-CM | POA: Diagnosis not present

## 2023-05-30 DIAGNOSIS — K219 Gastro-esophageal reflux disease without esophagitis: Secondary | ICD-10-CM | POA: Diagnosis not present

## 2023-05-30 DIAGNOSIS — I700 Atherosclerosis of aorta: Secondary | ICD-10-CM | POA: Diagnosis not present

## 2023-05-30 DIAGNOSIS — I517 Cardiomegaly: Secondary | ICD-10-CM | POA: Diagnosis not present

## 2023-05-30 DIAGNOSIS — Z87891 Personal history of nicotine dependence: Secondary | ICD-10-CM

## 2023-05-30 DIAGNOSIS — M1811 Unilateral primary osteoarthritis of first carpometacarpal joint, right hand: Secondary | ICD-10-CM | POA: Diagnosis not present

## 2023-05-30 DIAGNOSIS — Z981 Arthrodesis status: Secondary | ICD-10-CM

## 2023-05-30 MED ORDER — SENNOSIDES-DOCUSATE SODIUM 8.6-50 MG PO TABS
2.0000 | ORAL_TABLET | Freq: Two times a day (BID) | ORAL | 0 refills | Status: DC
  Filled 2023-05-30: qty 20, 5d supply, fill #0

## 2023-05-30 MED ORDER — PHENOL 1.4 % MT LIQD: 1.0000 | OROMUCOSAL | Status: DC | PRN

## 2023-05-30 MED ORDER — HEPARIN SODIUM (PORCINE) 1000 UNIT/ML DIALYSIS: 3000.0000 [IU] | Freq: Once | INTRAMUSCULAR | Status: DC

## 2023-05-30 MED ORDER — HEPARIN SODIUM (PORCINE) 1000 UNIT/ML DIALYSIS
1000.0000 [IU] | INTRAMUSCULAR | Status: DC | PRN
  Administered 2023-06-02: 1000 [IU]
  Filled 2023-05-30: qty 1

## 2023-05-30 MED ORDER — CYCLOBENZAPRINE HCL 10 MG PO TABS
10.0000 mg | ORAL_TABLET | Freq: Three times a day (TID) | ORAL | Status: DC | PRN
  Administered 2023-05-31 – 2023-07-14 (×7): 10 mg via ORAL
  Filled 2023-05-30: qty 1
  Filled 2023-05-30: qty 2
  Filled 2023-05-30: qty 1
  Filled 2023-05-30: qty 2
  Filled 2023-05-30 (×3): qty 1
  Filled 2023-05-30: qty 2

## 2023-05-30 MED ORDER — SEVELAMER CARBONATE 800 MG PO TABS
3200.0000 mg | ORAL_TABLET | Freq: Three times a day (TID) | ORAL | Status: DC
Start: 2023-05-30 — End: 2023-05-30
  Administered 2023-05-30: 3200 mg via ORAL
  Filled 2023-05-30 (×2): qty 4

## 2023-05-30 MED ORDER — GABAPENTIN 100 MG PO CAPS
300.0000 mg | ORAL_CAPSULE | Freq: Every day | ORAL | 2 refills | Status: AC
  Filled 2023-05-30: qty 30, 10d supply, fill #0

## 2023-05-30 MED ORDER — PENTAFLUOROPROP-TETRAFLUOROETH EX AERO: 1.0000 | INHALATION_SPRAY | CUTANEOUS | Status: DC | PRN

## 2023-05-30 MED ORDER — DIPHENHYDRAMINE HCL 25 MG PO CAPS: 25.0000 mg | ORAL_CAPSULE | Freq: Four times a day (QID) | ORAL | Status: DC | PRN

## 2023-05-30 MED ORDER — OXYCODONE-ACETAMINOPHEN 5-325 MG PO TABS
1.0000 | ORAL_TABLET | ORAL | 0 refills | Status: DC | PRN
Start: 2023-05-30 — End: 2023-07-14

## 2023-05-30 MED ORDER — PROCHLORPERAZINE 25 MG RE SUPP: 12.5000 mg | Freq: Four times a day (QID) | RECTAL | Status: DC | PRN

## 2023-05-30 MED ORDER — CHLORHEXIDINE GLUCONATE CLOTH 2 % EX PADS
6.0000 | MEDICATED_PAD | Freq: Every day | CUTANEOUS | Status: DC
  Administered 2023-05-31 – 2023-06-04 (×5): 6 via TOPICAL

## 2023-05-30 MED ORDER — HEPARIN SODIUM (PORCINE) 1000 UNIT/ML DIALYSIS: 1000.0000 [IU] | INTRAMUSCULAR | Status: DC | PRN

## 2023-05-30 MED ORDER — ALTEPLASE 2 MG IJ SOLR: 2.0000 mg | Freq: Once | INTRAMUSCULAR | Status: DC | PRN

## 2023-05-30 MED ORDER — ANTICOAGULANT SODIUM CITRATE 4% (200MG/5ML) IV SOLN: 5.0000 mL | Status: DC | PRN

## 2023-05-30 MED ORDER — ACETAMINOPHEN 325 MG PO TABS
325.0000 mg | ORAL_TABLET | ORAL | Status: DC | PRN
  Administered 2023-05-31 – 2023-06-01 (×2): 650 mg via ORAL
  Administered 2023-06-01: 325 mg via ORAL
  Administered 2023-06-02 – 2023-06-09 (×8): 650 mg via ORAL
  Administered 2023-06-18: 325 mg via ORAL
  Administered 2023-06-26 – 2023-07-08 (×2): 650 mg via ORAL
  Filled 2023-05-30 (×13): qty 2
  Filled 2023-05-30: qty 1
  Filled 2023-05-30 (×3): qty 2

## 2023-05-30 MED ORDER — BISACODYL 10 MG RE SUPP: 10.0000 mg | Freq: Every day | RECTAL | Status: DC | PRN

## 2023-05-30 MED ORDER — ATORVASTATIN CALCIUM 10 MG PO TABS
10.0000 mg | ORAL_TABLET | ORAL | 2 refills | Status: AC
  Filled 2023-05-30: qty 14, 98d supply, fill #0

## 2023-05-30 MED ORDER — CINACALCET HCL 30 MG PO TABS
120.0000 mg | ORAL_TABLET | Freq: Every day | ORAL | Status: DC
  Administered 2023-05-30 – 2023-07-14 (×42): 120 mg via ORAL
  Filled 2023-05-30 (×50): qty 4

## 2023-05-30 MED ORDER — POLYETHYLENE GLYCOL 3350 17 G PO PACK
17.0000 g | PACK | Freq: Two times a day (BID) | ORAL | Status: DC
  Administered 2023-05-30 – 2023-05-31 (×2): 17 g via ORAL
  Filled 2023-05-30 (×2): qty 1

## 2023-05-30 MED ORDER — MILK AND MOLASSES ENEMA: 1.0000 | Freq: Every day | RECTAL | Status: DC | PRN

## 2023-05-30 MED ORDER — HEPARIN SODIUM (PORCINE) 1000 UNIT/ML DIALYSIS
1000.0000 [IU] | INTRAMUSCULAR | Status: AC | PRN
  Administered 2023-06-02: 2000 [IU] via INTRAVENOUS_CENTRAL

## 2023-05-30 MED ORDER — OXYCODONE-ACETAMINOPHEN 5-325 MG PO TABS
1.0000 | ORAL_TABLET | ORAL | Status: DC | PRN
  Administered 2023-05-30 – 2023-06-03 (×4): 2 via ORAL
  Administered 2023-06-07: 1 via ORAL
  Administered 2023-06-08 – 2023-06-11 (×5): 2 via ORAL
  Administered 2023-06-15: 1 via ORAL
  Administered 2023-06-17 – 2023-07-02 (×15): 2 via ORAL
  Administered 2023-07-03: 1 via ORAL
  Administered 2023-07-04 – 2023-07-14 (×12): 2 via ORAL
  Filled 2023-05-30 (×10): qty 2
  Filled 2023-05-30: qty 1
  Filled 2023-05-30 (×6): qty 2
  Filled 2023-05-30: qty 1
  Filled 2023-05-30 (×22): qty 2

## 2023-05-30 MED ORDER — SORBITOL 70 % SOLN: 30.0000 mL | Freq: Every day | Status: DC | PRN

## 2023-05-30 MED ORDER — POLYETHYLENE GLYCOL 3350 17 GM/SCOOP PO POWD
17.0000 g | Freq: Two times a day (BID) | ORAL | 0 refills | Status: DC
  Filled 2023-05-30: qty 238, 7d supply, fill #0

## 2023-05-30 MED ORDER — LIDOCAINE-PRILOCAINE 2.5-2.5 % EX CREA: 1.0000 | TOPICAL_CREAM | CUTANEOUS | Status: DC | PRN

## 2023-05-30 MED ORDER — MENTHOL 3 MG MT LOZG: 1.0000 | LOZENGE | OROMUCOSAL | Status: DC | PRN

## 2023-05-30 MED ORDER — SENNOSIDES-DOCUSATE SODIUM 8.6-50 MG PO TABS
2.0000 | ORAL_TABLET | Freq: Two times a day (BID) | ORAL | Status: DC
  Administered 2023-05-30 – 2023-06-15 (×27): 2 via ORAL
  Administered 2023-06-16: 1 via ORAL
  Administered 2023-06-17 – 2023-07-15 (×43): 2 via ORAL
  Filled 2023-05-30 (×80): qty 2

## 2023-05-30 MED ORDER — LIDOCAINE HCL (PF) 1 % IJ SOLN: 5.0000 mL | INTRAMUSCULAR | Status: DC | PRN

## 2023-05-30 MED ORDER — GABAPENTIN 300 MG PO CAPS
300.0000 mg | ORAL_CAPSULE | Freq: Every day | ORAL | Status: DC
  Administered 2023-05-30 – 2023-07-14 (×46): 300 mg via ORAL
  Filled 2023-05-30 (×46): qty 1

## 2023-05-30 MED ORDER — CARBAMIDE PEROXIDE 6.5 % OT SOLN
5.0000 [drp] | Freq: Two times a day (BID) | OTIC | Status: DC
  Administered 2023-05-31 – 2023-07-14 (×60): 5 [drp] via OTIC
  Filled 2023-05-30 (×5): qty 15

## 2023-05-30 MED ORDER — CHLORHEXIDINE GLUCONATE CLOTH 2 % EX PADS: 6.0000 | MEDICATED_PAD | Freq: Every day | CUTANEOUS | Status: DC

## 2023-05-30 MED ORDER — SEVELAMER CARBONATE 800 MG PO TABS
3200.0000 mg | ORAL_TABLET | Freq: Three times a day (TID) | ORAL | Status: DC
  Administered 2023-05-31 – 2023-06-12 (×35): 3200 mg via ORAL
  Filled 2023-05-30 (×36): qty 4

## 2023-05-30 MED ORDER — CYCLOBENZAPRINE HCL 10 MG PO TABS
10.0000 mg | ORAL_TABLET | Freq: Three times a day (TID) | ORAL | 0 refills | Status: AC | PRN
  Filled 2023-05-30: qty 30, 10d supply, fill #0

## 2023-05-30 MED ORDER — PANTOPRAZOLE SODIUM 40 MG PO TBEC
40.0000 mg | DELAYED_RELEASE_TABLET | Freq: Two times a day (BID) | ORAL | Status: DC
  Administered 2023-05-30 – 2023-07-15 (×87): 40 mg via ORAL
  Filled 2023-05-30 (×43): qty 1
  Filled 2023-05-30: qty 2
  Filled 2023-05-30 (×47): qty 1

## 2023-05-30 MED ORDER — PROCHLORPERAZINE EDISYLATE 10 MG/2ML IJ SOLN: 5.0000 mg | Freq: Four times a day (QID) | INTRAMUSCULAR | Status: DC | PRN

## 2023-05-30 MED ORDER — PROCHLORPERAZINE MALEATE 5 MG PO TABS: 5.0000 mg | ORAL_TABLET | Freq: Four times a day (QID) | ORAL | Status: DC | PRN

## 2023-05-30 MED ORDER — MELATONIN 5 MG PO TABS
5.0000 mg | ORAL_TABLET | Freq: Every evening | ORAL | Status: DC | PRN
  Administered 2023-05-30 – 2023-06-21 (×5): 5 mg via ORAL
  Filled 2023-05-30 (×7): qty 1

## 2023-05-30 MED ORDER — GUAIFENESIN-DM 100-10 MG/5ML PO SYRP: 5.0000 mL | ORAL_SOLUTION | Freq: Four times a day (QID) | ORAL | Status: DC | PRN

## 2023-05-30 MED ORDER — ATORVASTATIN CALCIUM 10 MG PO TABS
10.0000 mg | ORAL_TABLET | ORAL | Status: DC
  Administered 2023-06-06 – 2023-07-11 (×6): 10 mg via ORAL
  Filled 2023-05-30 (×6): qty 1

## 2023-05-30 NOTE — Progress Notes (Signed)
 [facility] Progress Note  Subjective:  Seen in room No c/o's  Vitals:   05/30/23 0137 05/30/23 0145 05/30/23 0230 05/30/23 0447  BP: 122/82 (!) 148/89 (!) 150/92 (!) 146/86  Pulse: 96 96 100 98  Resp: 11 14  19   Temp:  97.6 F (36.4 C) (!) 97.5 F (36.4 C) 97.6 F (36.4 C)  TempSrc:   Oral Oral  SpO2: 100% 100% 100% 100%  Weight:  86.5 kg    Height:        Exam:  alert, nad , c-collar  no jvd  Chest cta bilat  Cor reg no RG  Abd soft ntnd no ascites   Ext no LE edema   Alert, NF, ox3     RFA AVF+ t/b   OP HD: [facility] MWF 4h  B400   85.5kg   2K bath  AVF Heparin 3000 Mircera 60mcg IV q 2 weeks- last dose 05/24/23 Calcitriol 1.5mcg PO q HD  Renal -related home meds: Sensipar 120 mg at bedtime Lokelma 10 g daily Rena-Vite 1 daily Renvela 3-4 AC 3 times daily Others: Carafate 1 g twice daily as needed, PPI, Percocet, Neurontin, eyedrops, amoxicillin, Lipitor   Assessment/Plan: 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  Per DC to inpatient rehab today.  ESRD: HD MWF. Next HD Friday.  HTN: BP's stable 125/ 85 range. Not on any HTN meds.  Volume: 1kg up, euvolemic on exam.  Anemia: Hgb 8- 10.5, ESA recently dosed Metabolic bone disease: Calcium a bit high, holding VDRA for now   Nutrition:  On renal diet   Larry Poag MD  [facility] 05/30/2023, 11:26 AM  Recent Labs  Lab 05/27/23 0921 05/29/23 2150 05/29/23 2155  HGB 8.4*  --  9.3*  ALBUMIN 2.8* 2.9*  --   CALCIUM 9.1 8.8*  --   PHOS  --  6.2*  --   CREATININE 9.65* 9.36*  --   K 4.5 5.5*  --    No results for input(s): "IRON", "TIBC", "FERRITIN" in the last 168 hours. Inpatient medications:  atorvastatin  10 mg Oral Q Thu   carbamide peroxide  5 drop Right EAR BID   Chlorhexidine Gluconate Cloth  6 each Topical Q0600   Chlorhexidine Gluconate Cloth  6 each Topical Q0600   cinacalcet  120 mg Oral Q supper   gabapentin  300 mg Oral QHS    pantoprazole  40 mg Oral BID AC   polyethylene glycol  17 g Oral BID   povidone-iodine   Topical Once   povidone-iodine  2 Application Topical Once   senna-docusate  2 tablet Oral BID   sevelamer carbonate  3,200-4,000 mg Oral TID WC   sodium chloride flush  3 mL Intravenous Q12H   sodium chloride flush  3 mL Intravenous Q12H   sodium chloride flush  3 mL Intravenous Q12H     acetaminophen **OR** acetaminophen, cyclobenzaprine, hydrALAZINE, menthol-cetylpyridinium **OR** phenol, ondansetron **OR** ondansetron (ZOFRAN) IV, oxyCODONE-acetaminophen, sodium chloride flush, sodium chloride flush

## 2023-05-30 NOTE — Progress Notes (Signed)
 Occupational Therapy Treatment Patient Details Name: Carl Freiberger Sr. MRN: [id number] DOB: [birth year] Today's Date: 05/30/2023   History of present illness The pt is a 63 yo male presenting 4/12 after a fall with subsequent difficulty moving legs and hands. 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 PMH includes: recurrent GIB, epilepsy, PRES, TIA, HTN, renal cell carcinoma, CVA, OSA, ESRD, DM II, and Ischemic colitis.   OT comments  Pt agreeable to OT/PT co-treat. Pt able to complete bed mobility to get EOB with CGA for safety while rolling and transferring. Session focus on walking with Newman Nip. Pt able to sit<>stand with min A +2 for power up. Once positioned in sara plus pt mod A +2 for mobility, addition +2 support for chair follow and steering equipment. Pt ambulated 4x with the following distances each trial: 6 ft, 30 ft, 34 ft, and 10 ft. Pt completed grooming while seated in recliner during rest break between ambulation to wash face. Acute OT to continue to follow to address established goals to facilitate DC to next venue of care.        If plan is discharge home, recommend the following:  A lot of help with walking and/or transfers;Two people to help with walking and/or transfers;A lot of help with bathing/dressing/bathroom;Two people to help with bathing/dressing/bathroom;Assistance with feeding;Assistance with cooking/housework;Assist for transportation;Help with stairs or ramp for entrance   Equipment Recommendations  Other (comment) (TBD)    Recommendations for Other Services      Precautions / Restrictions Precautions Precautions: Fall;Cervical Precaution Booklet Issued: Yes (comment) Recall of Precautions/Restrictions: Impaired Required Braces or Orthoses: Cervical Brace Cervical Brace: Hard collar;Other (comment) Restrictions Weight Bearing Restrictions Per Provider Order: No       Mobility Bed  Mobility Overal bed mobility: Needs Assistance Bed Mobility: Rolling, Sidelying to Sit Rolling: Contact guard assist Sidelying to sit: Contact guard assist, HOB elevated       General bed mobility comments: bed mobility completed with CGA for safety, required increased time but ultimately able to complete without physical assistance.    Transfers Overall transfer level: Needs assistance Equipment used: Ambulation equipment used Transfers: Sit to/from Stand Sit to Stand: +2 physical assistance, +2 safety/equipment, Min assist, Via lift equipment           General transfer comment: pt used Huntley Dec plus to assist with standing and required min A +2 to power up, LUE bandaged to forearm support to aid in keeping arm secure and from sliding Transfer via Lift Equipment:  (sara plus)   Balance Overall balance assessment: Needs assistance, History of Falls Sitting-balance support: Bilateral upper extremity supported, Feet supported Sitting balance-Leahy Scale: Poor Sitting balance - Comments: sat EOB with both extremities used for support, close guard for safety Postural control: Posterior lean Standing balance support: Bilateral upper extremity supported, During functional activity Standing balance-Leahy Scale: Poor Standing balance comment: reliant on forearm support from sara plus to help support self                           ADL either performed or assessed with clinical judgement   ADL Overall ADL's : Needs assistance/impaired     Grooming: Wash/dry face;Contact guard assist;Supervision/safety;Sitting Grooming Details (indicate cue type and reason): used LUE to wash face while seated  Functional mobility during ADLs: +2 for safety/equipment;+2 for physical assistance;Moderate assistance General ADL Comments: session focused on improving functional mobility with PT    Extremity/Trunk Assessment              Vision        Perception     Praxis     Communication Communication Communication: No apparent difficulties   Cognition Arousal: Alert Behavior During Therapy: WFL for tasks assessed/performed               OT - Cognition Comments: reviewed cervical precautions and pt able to implement while participating in activity                 Following commands: Intact        Cueing   Cueing Techniques: Verbal cues, Tactile cues  Exercises      Shoulder Instructions       General Comments VSS on RA    Pertinent Vitals/ Pain       Pain Assessment Pain Assessment: Faces Faces Pain Scale: Hurts a little bit Pain Location: neck Pain Descriptors / Indicators: Discomfort, Grimacing Pain Intervention(s): Limited activity within patient's tolerance, Monitored during session  Home Living                                          Prior Functioning/Environment              Frequency  Min 2X/week        Progress Toward Goals  OT Goals(current goals can now be found in the care plan section)  Progress towards OT goals: Progressing toward goals  Acute Rehab OT Goals Patient Stated Goal: get better OT Goal Formulation: With patient Time For Goal Achievement: 06/09/23 Potential to Achieve Goals: Good ADL Goals Pt Will Perform Eating: with set-up;with adaptive utensils;sitting;bed level Pt Will Perform Grooming: with set-up;with adaptive equipment;bed level;sitting Pt Will Perform Upper Body Bathing: with supervision;sitting Pt Will Transfer to Toilet: with min assist;stand pivot transfer;bedside commode Pt/caregiver will Perform Home Exercise Program: Increased strength;Increased ROM;Both right and left upper extremity;With theraputty;With Supervision;With written HEP provided  Plan      Co-evaluation    PT/OT/SLP Co-Evaluation/Treatment: Yes Reason for Co-Treatment: Complexity of the patient's impairments (multi-system involvement);For  patient/therapist safety;To address functional/ADL transfers PT goals addressed during session: Mobility/safety with mobility;Balance;Proper use of DME OT goals addressed during session: ADL's and self-care;Strengthening/ROM      AM-PAC OT "6 Clicks" Daily Activity     Outcome Measure   Help from another person eating meals?: A Lot Help from another person taking care of personal grooming?: A Lot Help from another person toileting, which includes using toliet, bedpan, or urinal?: A Lot Help from another person bathing (including washing, rinsing, drying)?: A Lot Help from another person to put on and taking off regular upper body clothing?: A Lot Help from another person to put on and taking off regular lower body clothing?: Total 6 Click Score: 11    End of Session Equipment Utilized During Treatment: Gait belt;Cervical collar;Other (comment) Abraham Hoffmann Plus)  OT Visit Diagnosis: Unsteadiness on feet (R26.81);Other abnormalities of gait and mobility (R26.89);Muscle weakness (generalized) (M62.81)   Activity Tolerance Patient tolerated treatment well   Patient Left in chair;with call bell/phone within reach   Nurse Communication Mobility status        Time: 9147-8295 OT Time Calculation (min): 47 min  Charges: OT General Charges $OT Visit: 1 Visit OT Treatments $Self Care/Home Management : 8-22 mins  Emira Eubanks, BS, OTA/S   Zykeem Bauserman 05/30/2023, 9:57 AM

## 2023-05-30 NOTE — Progress Notes (Addendum)
 Inpatient Rehabilitation Admissions Coordinator   I have insurance approval and CIR bed to admit him to today. Acute team and TOC made aware. I left a voicemail for his son. Hemodialysis made aware of admit to CIR.  Jeannetta Millman, RN, MSN Rehab Admissions Coordinator [phone] 05/30/2023 7:42 AM

## 2023-05-30 NOTE — Progress Notes (Addendum)
 Angelina Sheriff, DO  Physician Physical Medicine and Rehabilitation   PMR Pre-admission    Signed   Date of Service: 05/27/2023  4:40 PM  Related encounter: ED to Hosp-Admission (Discharged) from 05/25/2023 in [place] [facility] PRO[facility]   Signed     Expand All Collapse All  Show:Clear all [x] Written[x] Templated[x] Copied  Added by: [x] Kia Stavros, Tye [place], RN[x] Beryle Beams  [] Hover for details PMR Admission Coordinator Pre-Admission Assessment   Patient: Carl Hagger Sr. is an 63 y.o., male MRN: [id number] DOB: Nov 21, 1960 Height: 6\' 1"  (185.4 cm) Weight: 86.5 kg                                                                                                                                                  Insurance Information HMO: HMOPOS Dual complete    PPO:      PCP:      IPA:      80/20:      OTHER:  PRIMARY: [employer] Dual complete  Policy#: [id number]   ; Medicare [id number]   Subscriber: patient CM Name: Marylene Land      Phone#: [phone] option 3   Fax#: [phone] Pre-Cert#: [id number]   approved for 4/17 until 4/22   Employer:  Benefits:  Phone #: [phone]     Name: 4/15 Eff. Date: 03/16/23     Deduct: $257      Out of Pocket Max: $9350      Life Max: none  CIR: $1565 co pay per admission      SNF: no co pay per day days 1 until 20; $209.50 co pay per day days 21 until 100 Outpatient: 80%     Co-Pay: 20% Home Health: 100%      Co-Pay: none DME: 80%     Co-Pay: 20% Providers: in network SECONDARY: Medicaid of [place]      Policy#: [id number] I; active per passport one source online on 4/15 [facility]   Financial Counselor:       Phone#:    The "Data Collection Information Summary" for patients in Inpatient Rehabilitation Facilities with attached "Privacy Act Statement-Health Care Records" was provided and verbally reviewed with: Patient and Family   Emergency Contact Information Contact Information       Name Relation Home Work Mobile     [place] Son     [phone]    Shuan, Statzer     [phone]    [place] Daughter     [phone]         Other Contacts   None on File      Current Medical History  Patient Admitting Diagnosis: C4 cervical fracture, tetraplegia   History of Present Illness: Carl Fearn Sr. is a 63 y.o. male with a history of GIB Jehovah's witness but has accepted blood product's in the past), ESRD on HD,  OSA,  seizure, essential HTN, prior lacunar infarct who has baseline cervical myelopathy who was walking to his car on 05/25/23 when he became suddenly weak and collapsed.   A code stroke was called. CTH demonstrated an acute left PCA infarct. CT of C-spine revealed a  C4 fracture with severe canal compromise signal abnormality of the cord at that level. Pt with UE>LE weakness on exam and neurology felt he presented more as a spinal cord injury than a stroke. Dr. Adonis Alamin was consulted and took the patient to the OR on 4/13 for C3-C5 ACDF. Hgb down to 8.4 post-op. Hgb being monitored, no anticoagulants or platelets due to hx of GIB. CPAP for OSA.    Resumed home Neurontin.   Continue Protonix. Occasional episodes of hypotension with HD, not currently requiring midodrine.   Complete NIHSS TOTAL: 14 Glasgow Coma Scale Score: 15   Patient's medical record from [facility] has been reviewed by the rehabilitation admission coordinator and physician.   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     Parathyroid abnormality (HCC)      ectopic parathyroid gland   Presence of arteriovenous fistula for hemodialysis, primary (HCC)      RUE PER PT RLE   Refusal of blood product      NO WHOLE BLOOD PROUCTS   Renal cell carcinoma (HCC)      s/p hand assisted laparoscopic bilateral nephrectomies 11/29/17, + RCC left   Secondary hyperparathyroidism (HCC)     Seizures (HCC)      one episode in past, due to" elevated Potassium" 08/02/20- "at least 4 years ago"   Sleep apnea      doesn't use CPAP anymore since weight loss   Stroke [facility])      no residual        Has the patient had major surgery during 100 days prior to admission? Yes   Family History  family history includes Anuerysm in his brother; Diabetes in his father; Hypertension in his father and sister; Lupus in his sister; Stroke in his father and sister; Uterine cancer in his mother.   Current Medications   Current Medications    Current Facility-Administered Medications:    acetaminophen (TYLENOL) tablet 650 mg, 650 mg, Oral, Q4H PRN, 650 mg at 05/30/23 0355 **OR** acetaminophen (TYLENOL) suppository 650 mg, 650 mg, Rectal, Q4H PRN, [name], MD   atorvastatin (LIPITOR) tablet 10 mg, 10 mg, Oral, Q Thu, Pool, [name], MD, 10 mg at 05/30/23 0903   carbamide peroxide (DEBROX) 6.5 % OTIC (EAR) solution 5 drop, 5 drop, Right EAR, BID, [name], [name], MD, 5 drop  at 05/30/23 0908   Chlorhexidine Gluconate Cloth 2 % PADS 6 each, 6 each, Topical, Q0600, Collins, Samantha G, PA-C, 6 each at 05/30/23 4098   Chlorhexidine Gluconate Cloth 2 % PADS 6 each, 6 each, Topical, Q0600, [name], MD, 6 each at 05/30/23 0538   cinacalcet (SENSIPAR) tablet 120 mg, 120 mg, Oral, Q supper, [name], MD, 120 mg at 05/29/23 1906   cyclobenzaprine (FLEXERIL) tablet 10 mg, 10 mg, Oral, TID PRN, [name], MD, 10 mg at 05/30/23 0903   gabapentin  (NEURONTIN) capsule 300 mg, 300 mg, Oral, QHS, [name], [name], MD, 300 mg at 05/28/23 2151   hydrALAZINE (APRESOLINE) injection 10 mg, 10 mg, Intravenous, Q6H PRN, [name], MD   menthol-cetylpyridinium (CEPACOL) lozenge 3 mg, 1 lozenge, Oral, PRN **OR** phenol (CHLORASEPTIC) mouth spray 1 spray, 1 spray, Mouth/Throat, PRN, Pool, [name], MD   ondansetron (ZOFRAN) tablet 4 mg, 4 mg, Oral, Q6H PRN **OR** ondansetron (ZOFRAN) injection 4 mg, 4 mg, Intravenous, Q6H PRN, [name], MD   oxyCODONE-acetaminophen (PERCOCET/ROXICET) 5-325 MG per tablet 1-2 tablet, 1-2 tablet, Oral, Q4H PRN, [name], [name], MD, 2 tablet at 05/30/23 0903   pantoprazole (PROTONIX) EC tablet 40 mg, 40 mg, Oral, BID AC, Pool, [name], MD, 40 mg at 05/30/23 0903   polyethylene glycol (MIRALAX / GLYCOLAX) packet 17 g, 17 g, Oral, BID, Haydee Lipa, MD, 17 g at 05/30/23 1191   povidone-iodine (BETADINE) 7.5 % scrub, , Topical, Once, Magnant, Charles L, PA-C   povidone-iodine 10 % swab 2 Application, 2 Application, Topical, Once, Magnant, Charles L, PA-C   senna-docusate (Senokot-S) tablet 2 tablet, 2 tablet, Oral, BID, Haydee Lipa, MD, 2 tablet at 05/30/23 4782   sevelamer carbonate (RENVELA) tablet 3,200-4,000 mg, 3,200-4,000 mg, Oral, TID WC, [name], MD, 3,200 mg at 05/29/23 1905   sodium chloride flush (NS) 0.9 % injection 3 mL, 3 mL, Intravenous, Q12H, Pool, [name], MD, 3 mL at 05/28/23 2200   sodium chloride flush (NS) 0.9 % injection 3 mL, 3 mL, Intravenous, Q12H, Pool, [name], MD, 3 mL at 05/30/23 0905   sodium chloride flush (NS) 0.9 % injection 3 mL, 3 mL, Intravenous, PRN, [name], MD   sodium chloride flush (NS) 0.9 % injection 3 mL, 3 mL, Intravenous, Q12H, Pool, [name], MD, 3 mL at 05/28/23 2200   sodium chloride flush (NS) 0.9 % injection 3 mL, 3 mL, Intravenous, PRN, [name], MD     Patients Current Diet:  Diet Order                  Diet - low sodium heart healthy              Diet renal with fluid restriction Fluid restriction: 1200 mL Fluid; Room service appropriate? Yes; Fluid consistency: Thin  Diet effective now                       Precautions / Restrictions Precautions Precautions: Fall, Cervical Precaution Booklet Issued: Yes (comment) Precaution/Restrictions Comments: reviewed precautions Cervical Brace: Hard collar, Other (comment) Restrictions Weight Bearing Restrictions Per Provider Order: No    Has the patient had 2 or more falls or a fall with injury in the past year?Yes   Prior Activity Level Community (5-7x/wk): mod I with cane; more falls noted over past few months   Prior Functional Level Prior Function Prior Level of Function : Independent/Modified Independent, History of Falls (last six months), Driving Mobility Comments: uses cane for  mobility, multiple falls on stairs in last 3 months. ADLs Comments: Indep with ADLs, IADLs. was previously driving to/from HD but due to RLE difficulties has reduced driving   Self Care: Did the patient need help bathing, dressing, using the toilet or eating?  Independent   Indoor Mobility: Did the patient need assistance with walking from room to room (with or without device)? Independent   Stairs: Did the patient need assistance with internal or external stairs (with or without device)? Needed some help   Functional Cognition: Did the patient need help planning regular tasks such as shopping or remembering to take medications? Needed some help   Patient Information Are you of Hispanic, Latino/a,or Spanish origin?: A. No, not of Hispanic, Latino/a, or Spanish origin What is your race?: B. Black or African American Do you need or want an interpreter to communicate with a doctor or health care staff?: 0. No   Patient's Response To:  Health Literacy and Transportation Is the patient able to respond to health literacy and transportation needs?: Yes Health Literacy - How often do you need  to have someone help you when you read instructions, pamphlets, or other written material from your doctor or pharmacy?: Never In the past 12 months, has lack of transportation kept you from medical appointments or from getting medications?: No In the past 12 months, has lack of transportation kept you from meetings, work, or from getting things needed for daily living?: No   Home Assistive Devices / Equipment Home Equipment: Cane - single point, Hand held shower head, Other (comment), Toilet riser, Rollator (4 wheels), Rolling Walker (2 wheels), BSC/3in1 (fall mat in tub shower)   Prior Device Use: Indicate devices/aids used by the patient prior to current illness, exacerbation or injury?  cane   Current Functional Level Cognition   Orientation Level: Oriented X4    Extremity Assessment (includes Sensation/Coordination)   Upper Extremity Assessment: RUE deficits/detail, LUE deficits/detail RUE Deficits / Details: previously R handed but reports gradual decline in this UE's function. Reports need for shoulder surgery-potentially this year. HD fistula in this UE. able to bend/extend elbow WFL. able to flex shoulder AAROM to 65* but limited to ~20AROM. unable to make fist or extend digits but passively [facility]. Poor wrist control/pronation/supination needed for self feeding. reports sensation in this UE more impaired than LUE RUE Sensation: decreased light touch RUE Coordination: decreased fine motor, decreased gross motor LUE Deficits / Details: pt's now dominant hand per report. able to extend digits, and composite flexion lacks 2cm to distal palmar crease thus unable to make a fist but passively able to stretch. able to hold built up utensil with weak grasp. wrist and elbow AROM WFL. unable to flex this shoulder < 20*- reports this is new since the fall LUE Sensation: decreased light touch LUE Coordination: decreased fine motor, decreased gross motor  Lower Extremity Assessment: Defer to PT  evaluation RLE Deficits / Details: 3-/5 for ankle DF, 3+/5 for knee extension, 4/5 for knee flexion, 2-/5 for hip flexion, 3/5 for hip abd/add. pt reports sensation intact, but not able to discern when PT touching toes/foot. 4/5 proprioception testing RLE Sensation: decreased light touch, decreased proprioception RLE Coordination: decreased fine motor, decreased gross motor LLE Deficits / Details: 4-/5 for ankle DF, 4-/5 for knee extension, 4/5 for knee flexion, 3/5 for hip flexion, 4/5 for hip abd/add. pt reports sensation intact LLE Sensation: WNL LLE Coordination: decreased fine motor, decreased gross motor     ADLs   Overall  ADL's : Needs assistance/impaired Eating/Feeding: Minimal assistance, Sitting, With adaptive utensils Eating/Feeding Details (indicate cue type and reason): provided built up grip to use with LUE and pt able to perform with intermittent min A. Attempted to provide ucuff for RUE as pt reported he typically self feeds R handed, but pt with poor motor control at shoulder and with pronation/supination, and shoulder flexion, internal and external rotation to be able to scoop food and bring to mouth, mildly jerky movement also seems more due to weakness than ataxia Grooming: Wash/dry face, Contact guard assist, Supervision/safety, Sitting Grooming Details (indicate cue type and reason): used LUE to wash face while seated Upper Body Bathing: Moderate assistance, Sitting Lower Body Bathing: Maximal assistance, +2 for physical assistance, +2 for safety/equipment, Sitting/lateral leans, Sit to/from stand Upper Body Dressing : Moderate assistance, Sitting Lower Body Dressing: Total assistance, Sitting/lateral leans Lower Body Dressing Details (indicate cue type and reason): to don socks Toilet Transfer: Moderate assistance, +2 for physical assistance, +2 for safety/equipment, Stand-pivot Toilet Transfer Details (indicate cue type and reason): LLE knee block Toileting- Clothing  Manipulation and Hygiene: +2 for safety/equipment, +2 for physical assistance, Total assistance, Sit to/from stand, Sitting/lateral lean Functional mobility during ADLs: +2 for safety/equipment, +2 for physical assistance, Moderate assistance General ADL Comments: session focused on improving functional mobility with PT     Mobility   Overal bed mobility: Needs Assistance Bed Mobility: Rolling, Sidelying to Sit Rolling: Contact guard assist Sidelying to sit: Contact guard assist, HOB elevated Sit to supine: +2 for safety/equipment, Min assist, HOB elevated, +2 for physical assistance General bed mobility comments: bed mobility completed with CGA for safety, required increased time but ultimately able to complete without physical assistance.     Transfers   Overall transfer level: Needs assistance Equipment used: Ambulation equipment used Transfers: Sit to/from Stand Sit to Stand: +2 physical assistance, +2 safety/equipment, Min assist, Via lift equipment Bed to/from chair/wheelchair/BSC transfer type:: Step pivot Step pivot transfers: Max assist, +2 physical assistance, +2 safety/equipment Transfer via Lift Equipment:  (sara plus) General transfer comment: pt used Abraham Hoffmann plus to assist with standing and required min A +2 to power up, LUE bandaged to forearm support to aid in keeping arm secure and from sliding     Ambulation / Gait / Stairs / Wheelchair Mobility   Ambulation/Gait Ambulation/Gait assistance: +2 physical assistance, +2 safety/equipment, Mod assist (+4) Gait Distance (Feet): 36 Feet (x4 bouts of ~6 ft > ~30 ft > ~36 ft > ~10 ft) Assistive device:  (Sara Plus) Gait Pattern/deviations: Decreased stance time - right, Knees buckling, Decreased step length - right, Decreased dorsiflexion - right, Trunk flexed, Knee flexed in stance - right, Decreased weight shift to right, Decreased weight shift to left General Gait Details: +4 assist needed to guide Abraham Hoffmann Plus, support pt while  blocking his R UE inside and on the platform (L UE ACE wrapped to platform for same reason), provide chair follow, and provide support and tactile and verbal cues to weight shift bil, extend knees during stance (R>L), bring feet apart when taking steps (L>R), and advance foot when stepping on R. Pt providing good initiation and active participation of >50% but needs modAx4 for all these components. His ability to lift and swing his R leg and extend his L knee during stance improved as distance progressed. Gait velocity: reduced Gait velocity interpretation: <1.31 ft/sec, indicative of household ambulator Pre-gait activities: Cued pt to step forward then back with each foot, x >10 reps each leg.  Pt needed cues to push through his elbows on the bil platform walker to extend his trunk and stand more upright. Tactile and verbal cues provided to shift weight bil, extend the knee of the leg in stance phase while stepping forward/back with the contralateral leg (buckles more on R than L), and lifting his R leg to clear and take steps. Good carryover noted with faded feedback for these cues. MaxAx2 needed for balance and cuing and assisting R leg > L for swing phase and for knee extension in stance phase.     Posture / Balance Dynamic Sitting Balance Sitting balance - Comments: sat EOB with both extremities used for support, close guard for safety Balance Overall balance assessment: Needs assistance, History of Falls Sitting-balance support: Bilateral upper extremity supported, Feet supported Sitting balance-Leahy Scale: Poor Sitting balance - Comments: sat EOB with both extremities used for support, close guard for safety Postural control: Posterior lean Standing balance support: Bilateral upper extremity supported, During functional activity Standing balance-Leahy Scale: Poor Standing balance comment: reliant on forearm support from sara plus to help support self     Special needs/care consideration  Dialysis: Hemodialysis Monday, Wednesday, and Friday and Skin abrasion to head Son drives him to OP dialysis recently due to pt's weakness        Previous Home Environment  Living Arrangements:  (son)  Lives With: Son Available Help at Discharge: Family, Available 24 hours/day Type of Home: Apartment Home Layout: One level Home Access: Stairs to enter Entrance Stairs-Rails: Right, Left Entrance Stairs-Number of Steps:  (second floor apartment) Bathroom Shower/Tub: Engineer, manufacturing systems: Standard Bathroom Accessibility: Yes How Accessible: Accessible via walker Home Care Services: No   Discharge Living Setting Plans for Discharge Living Setting: Patient's home, Apartment, Lives with (comment) (son) Type of Home at Discharge: Apartment Discharge Home Layout: One level Discharge Home Access: Stairs to enter Entrance Stairs-Rails: Right, Left Entrance Stairs-Number of Steps: 18 steps; second floor apartment Discharge Bathroom Shower/Tub: Tub/shower unit Discharge Bathroom Toilet: Standard Discharge Bathroom Accessibility: Yes How Accessible: Accessible via walker Does the patient have any problems obtaining your medications?: No   Son is requesting first floor apartment from his complex, can get it end of May. 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   Social/Family/Support Systems Patient Roles: Parent Contact Information: son, Caryn Bee Anticipated Caregiver: Caryn Bee and family Anticipated Caregiver's Contact Information: see contacts Ability/Limitations of Caregiver: son works   Medical laboratory scientific officer: 24/7 Discharge Plan Discussed with Primary Caregiver: Yes Is Caregiver In Agreement with Plan?: Yes Does Caregiver/Family have Issues with Lodging/Transportation while Pt is in Rehab?: No   Goals Patient/Family Goal for Rehab: supervision to min with PT and  OT Expected length of stay: ELOS 20 to 25 days Pt/Family Agrees to Admission and willing to participate: Yes Program Orientation Provided & Reviewed with Pt/Caregiver Including Roles  & Responsibilities: Yes  Barriers to Discharge: Insurance for SNF coverage   Decrease burden of Care through IP rehab admission: n/a   Possible need for SNF placement upon discharge:not anticipated   Patient Condition: This patient's medical and functional status has not changed since the consult dated 05/28/23 in which the Rehabilitation Physician determined and documented that the patient was appropriate for intensive rehabilitative care in an inpatient rehabilitation facility. Will admit to inpatient rehab today.    Preadmission Screen Completed By:  Clois Dupes, RN MSN 05/30/2023 10:50 AM ______________________________________________________________________  Discussed status with Dr. Dorn Gaskins on 05/30/23 at 1049 and received approval for admission today.   Admission Coordinator:  Tanya Fantasia, RN MSN time 2130 Date 05/30/23             Revision History

## 2023-05-30 NOTE — Progress Notes (Signed)
   Providing Compassionate, Quality Care - Together   Subjective: Patient reports no new issues.  Objective: Vital signs in last 24 hours: Temp:  [97.5 F (36.4 C)-97.8 F (36.6 C)] 97.6 F (36.4 C) (04/17 0447) Pulse Rate:  [74-100] 98 (04/17 0447) Resp:  [8-19] 19 (04/17 0447) BP: (117-187)/(76-92) 146/86 (04/17 0447) SpO2:  [98 %-100 %] 100 % (04/17 0447) Weight:  [86.5 kg-88.3 kg] 86.5 kg (04/17 0145)  Intake/Output from previous day: 04/16 0701 - 04/17 0700 In: -  Out: 1500  Intake/Output this shift: No intake/output data recorded.  Alert and oriented PERRLA Speech clear MAE, stronger on the left Incision covered with Honeycomb dressing; Dressing is clean, dry, and intact  Lab Results: Recent Labs    05/29/23 2155  WBC 8.2  HGB 9.3*  HCT 29.5*  PLT 266   BMET Recent Labs    05/29/23 2150  NA 130*  K 5.5*  CL 92*  CO2 24  GLUCOSE 108*  BUN 47*  CREATININE 9.36*  CALCIUM 8.8*    Studies/Results: No results found.  Assessment/Plan: Patient underwent C4 corpectomy followed by C3-5 ACDF by Dr. Adonis Alamin on 05/26/2023. He is progressing well postoperatively.   LOS: 5 days   -Patient has a bed in CIR. He will discharge today.  I am in communication with my attending and they agree with the plan for this patient.   Henreitta Locus, DNP, AGNP-C Nurse Practitioner  [facility] [address], [address], [place], [place] [address] P: [phone]    F: [phone]  05/30/2023, 10:13 AM

## 2023-05-30 NOTE — Progress Notes (Signed)
 Physical Therapy Treatment Patient Details Name: Carl Markovitz Sr. MRN: [id number] DOB: [birth year] Today's Date: 05/30/2023   History of Present Illness The pt is a 63 yo male presenting 4/12 after a fall with subsequent difficulty moving legs and hands. 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 PMH includes: recurrent GIB, epilepsy, PRES, TIA, HTN, renal cell carcinoma, CVA, OSA, ESRD, DM II, and Ischemic colitis.    PT Comments  Treated pt in conjunction with OT in order to progress pt's mobility safely while utilizing the [employer] today. He continued to display difficulty keeping his elbows/arms on the platform, often internally rotating, thereby needing blocking or ACE wrap to secure his arms in place on the device. He demonstrated good initiation and active participation along with carryover of cues provided yesterday during gait training. However, he still needs assistance for weight shifting, extending knees during stance phase (R>L), and advancing his foot when stepping with his R foot. He was able to progress to ambulating x4 bouts with the [employer] and modAx4 (x2 for steering and chair follow), ambulating 6 ft > 30 ft > 36 ft > 10 ft each bout. Will continue to follow acutely.   If plan is discharge home, recommend the following: Two people to help with walking and/or transfers;Two people to help with bathing/dressing/bathroom;Assistance with cooking/housework;Assistance with feeding;Direct supervision/assist for medications management;Direct supervision/assist for financial management;Assist for transportation;Help with stairs or ramp for entrance;Supervision due to cognitive status   Can travel by private vehicle        Equipment Recommendations  Wheelchair (measurements PT);Wheelchair cushion (measurements PT);[employer] IT consultant for [employer] [facility] consult     Precautions / Restrictions  Precautions Precautions: Fall;Cervical Precaution Booklet Issued: Yes (comment) Recall of Precautions/Restrictions: Impaired Required Braces or Orthoses: Cervical Brace Cervical Brace: Hard collar;Other (comment) (Orders to apply/remove brace in sitting, may remove when in bed and to shower and may ambulate to bathroom without brace) Restrictions Weight Bearing Restrictions Per Provider Order: No     Mobility  Bed Mobility Overal bed mobility: Needs Assistance Bed Mobility: Rolling, Sidelying to Sit Rolling: Contact guard assist Sidelying to sit: Contact guard assist, HOB elevated       General bed mobility comments: Extra time to push up with his L UE to ascend his trunk to sit L EOB and to scoot his R hip to EOB. Pt able to roll and transition sidelying to sit EOB while maintaining his precautions with CGA for safety, HOB elevated.    Transfers Overall transfer level: Needs assistance Equipment used: Ambulation equipment used Transfers: Sit to/from Stand Sit to Stand: +2 physical assistance, +2 safety/equipment, Min assist, Via lift equipment           General transfer comment: Pt secured in [employer] Plus with L UE ACE wrapped to platform due to poor UE control to keep forearm and elbow within platform. Pt needed minAx2 to extend his knees and hips and maintain his stability with standing with assist from the [facility], x4 reps. Cues provided to extend legs and trunk and keep arms proximal to his trunk to pull up on device.    Ambulation/Gait Ambulation/Gait assistance: +2 physical assistance, +2 safety/equipment, Mod assist (+4) Gait Distance (Feet): 36 Feet (x4 bouts of ~6 ft > ~30 ft > ~36 ft > ~10 ft) Assistive device:  (Sara Plus) Gait Pattern/deviations: Decreased stance time - right, Knees buckling, Decreased step  length - right, Decreased dorsiflexion - right, Trunk flexed, Knee flexed in stance - right, Decreased weight shift to right, Decreased weight shift to  left Gait velocity: reduced Gait velocity interpretation: <1.31 ft/sec, indicative of household ambulator   General Gait Details: +4 assist needed to guide [employer], support pt while blocking his R UE inside and on the platform (L UE ACE wrapped to platform for same reason), provide chair follow, and provide support and tactile and verbal cues to weight shift bil, extend knees during stance (R>L), bring feet apart when taking steps (L>R), and advance foot when stepping on R. Pt providing good initiation and active participation of >50% but needs modAx4 for all these components. His ability to lift and swing his R leg and extend his L knee during stance improved as distance progressed.   Stairs             Wheelchair Mobility     Tilt Bed    Modified Rankin (Stroke Patients Only)       Balance Overall balance assessment: Needs assistance, History of Falls Sitting-balance support: Bilateral upper extremity supported, Feet supported Sitting balance-Leahy Scale: Poor Sitting balance - Comments: Pt using bil UEs to sit statically EOB, CGA for safety Postural control: Posterior lean Standing balance support: Bilateral upper extremity supported, During functional activity Standing balance-Leahy Scale: Poor Standing balance comment: reliant on bil UE support and modAx2 for static standing balance and R knee extension                            Communication Communication Communication: No apparent difficulties  Cognition Arousal: Alert Behavior During Therapy: WFL for tasks assessed/performed   PT - Cognitive impairments: Problem solving, Attention                       PT - Cognition Comments: Needs step-by-step cues intermittently to sequence and coordinate gait, but good carryover noted. Recalls and complies to his cervical precautions. Following commands: Intact      Cueing Cueing Techniques: Verbal cues  Exercises      General Comments         Pertinent Vitals/Pain Pain Assessment Pain Assessment: Faces Faces Pain Scale: Hurts a little bit Pain Location: neck Pain Descriptors / Indicators: Discomfort, Grimacing, Guarding, Operative site guarding Pain Intervention(s): Limited activity within patient's tolerance, Monitored during session, Repositioned    Home Living                          Prior Function            PT Goals (current goals can now be found in the care plan section) Acute Rehab PT Goals Patient Stated Goal: return to walking without falls, be able to do stairs into apt PT Goal Formulation: With patient Time For Goal Achievement: 06/09/23 Potential to Achieve Goals: Fair Progress towards PT goals: Progressing toward goals    Frequency    Min 3X/week      PT Plan      Co-evaluation PT/OT/SLP Co-Evaluation/Treatment: Yes Reason for Co-Treatment: Complexity of the patient's impairments (multi-system involvement);For patient/therapist safety;To address functional/ADL transfers PT goals addressed during session: Mobility/safety with mobility;Balance;Proper use of DME        AM-PAC PT "6 Clicks" Mobility   Outcome Measure  Help needed turning from your back to your side while in a flat bed without using bedrails?: A  Little Help needed moving from lying on your back to sitting on the side of a flat bed without using bedrails?: A Little Help needed moving to and from a bed to a chair (including a wheelchair)?: Total Help needed standing up from a chair using your arms (e.g., wheelchair or bedside chair)?: Total Help needed to walk in hospital room?: Total Help needed climbing 3-5 steps with a railing? : Total 6 Click Score: 10    End of Session Equipment Utilized During Treatment: Gait belt;Cervical collar Activity Tolerance: Patient tolerated treatment well Patient left: in chair;with call bell/phone within reach;with chair alarm set Nurse Communication: Mobility status PT Visit  Diagnosis: Unsteadiness on feet (R26.81);Repeated falls (R29.6);Muscle weakness (generalized) (M62.81)     Time: 8469-6295 PT Time Calculation (min) (ACUTE ONLY): 47 min  Charges:    $Gait Training: 23-37 mins PT General Charges $$ ACUTE PT VISIT: 1 Visit                     Vernida Goodie, PT, DPT Acute Rehabilitation Services  Office: [phone]    Ellyn Hack 05/30/2023, 9:19 AM

## 2023-05-30 NOTE — H&P (Addendum)
 Physical Medicine and Rehabilitation Admission H&P        Chief Complaint  Patient presents with   Functional deficits      HPI:  [name] is a 63 year old male (Jehovah's witness but has accepted blood products in the past)  with history of ESRD-HD MWF, duodenitis/gastritis w/recurrent GIB, GERD, seizure, HTN, OSA, RCC s/p B-lap nephrectomies, cerebral venous stenosis, CVA w/o residual deficits, chronic HA who has had progressive numbness with pain BUE, right foot drop X 2 weeks and balance issues who fell due to knees giving away and struck his face. He was unable to lift his legs and had weakness of BUE and presented to ED for evaluation on 05/25/23. MRI brain showed chronic L-PCA stroke iwht large left mastoid and middle ear effusion. CT C spine showed subacute C4  burst fracture with severe canal stenosis superimposed on  C4/5 & C5-6  advanced spondylosis with cord edema and/or evolving myelomalacia C3-C6. He was evaluated by Dr. Adonis Alamin, found to have tetraplegia BUE>BLE and was taken to OR emergently  for C4 anterior corpectomy with C3-C5 anterior fusion on 05/26/23 am. Post procedure HD ongoing and home gabapentin  resumed. PT/OT  has been working with patient who is showing improvement in balance at EOB with close guard assist for safety and posterior lean. He requires +2 assist with Stedy for mobility and ADLs.    He was independent and driving PTA but having difficulty with RLE and RUE so was planning on stopping. CIR recommended due to functional decline.      Review of Systems  HENT:  Negative for hearing loss.   Eyes:  Negative for blurred vision.  Respiratory:  Negative for cough and shortness of breath.   Cardiovascular:  Negative for chest pain.  Gastrointestinal:  Positive for constipation. Negative for heartburn.  Genitourinary:        Does not produce urine  Musculoskeletal:  Positive for falls (fell down a flight of stairs a few weeks ago.).  Skin:  Negative  for rash.  Neurological:  Positive for sensory change, speech change and weakness.  Psychiatric/Behavioral:  The patient does not have insomnia.             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)      no residual               Past Surgical History:  Procedure Laterality Date   ANTERIOR CERVICAL CORPECTOMY N/A 05/25/2023    Procedure: ANTERIOR CERVICAL CORPECTOMY CERVICAL FOUR, CERVICAL THREE - CERVICAL FIVE FUSION;  Surgeon: [name], MD;  Location: [facility] OR;  Service: Neurosurgery;  Laterality: N/A;   AV FISTULA PLACEMENT Right      right arm   BIOPSY   08/01/2019    Procedure: BIOPSY;  Surgeon: Tami Falcon, MD;  Location: [facility] ENDOSCOPY;  Service: Endoscopy;;   BIOPSY   11/18/2020    Procedure: BIOPSY;  Surgeon: Elois Hair, MD;  Location: [facility] ENDOSCOPY;  Service: Gastroenterology;;    BIOPSY   09/13/2021    Procedure: BIOPSY;  Surgeon: Normie Becton., MD;  Location: [facility] ENDOSCOPY;  Service: Gastroenterology;;   CATARACT EXTRACTION W/ INTRAOCULAR LENS  IMPLANT, BILATERAL       COLON SURGERY       COLONOSCOPY N/A 08/04/2015    Procedure: COLONOSCOPY;  Surgeon: Nannette Babe, MD;  Location: [facility] ENDOSCOPY;  Service: Endoscopy;  Laterality: N/A;   COLONOSCOPY   2017    JMP@ [facility]-good prep-mass -recall 1 yr   COLONOSCOPY N/A 09/13/2021    Procedure: COLONOSCOPY;  Surgeon: Mansouraty, Albino Alu., MD;  Location: [facility] ENDOSCOPY;  Service: Gastroenterology;  Laterality: N/A;   COLONOSCOPY WITH PROPOFOL  N/A 11/25/2020    Procedure: COLONOSCOPY WITH PROPOFOL ;  Surgeon: Daina Drum, MD;  Location: [facility] ENDOSCOPY;  Service: Gastroenterology;  Laterality: N/A;   COLONOSCOPY WITH PROPOFOL  N/A 09/23/2021    Procedure: COLONOSCOPY WITH PROPOFOL ;  Surgeon: Annis Kinder, DO;  Location: [facility] ENDOSCOPY;  Service: Gastroenterology;  Laterality: N/A;   ENTEROSCOPY N/A 09/23/2021    Procedure: ENTEROSCOPY;  Surgeon: Annis Kinder, DO;  Location: [facility] ENDOSCOPY;  Service: Gastroenterology;  Laterality: N/A;  Will place order for video capsule study as we may opt to place it during procedure   ESOPHAGOGASTRODUODENOSCOPY N/A 08/01/2019    Procedure: ESOPHAGOGASTRODUODENOSCOPY (EGD);  Surgeon: Tami Falcon, MD;  Location: [facility] ENDOSCOPY;  Service: Endoscopy;  Laterality: N/A;   ESOPHAGOGASTRODUODENOSCOPY N/A 11/18/2020    Procedure: ESOPHAGOGASTRODUODENOSCOPY (EGD);  Surgeon: Elois Hair, MD;  Location: [facility] ENDOSCOPY;  Service: Gastroenterology;  Laterality: N/A;   ESOPHAGOGASTRODUODENOSCOPY (EGD) WITH PROPOFOL  N/A 08/04/2019    Procedure: ESOPHAGOGASTRODUODENOSCOPY (EGD) WITH PROPOFOL ;  Surgeon: Ace Holder, MD;  Location: [facility] ENDOSCOPY;  Service: Gastroenterology;  Laterality: N/A;   ESOPHAGOGASTRODUODENOSCOPY (EGD) WITH PROPOFOL  N/A 09/13/2021    Procedure:  ESOPHAGOGASTRODUODENOSCOPY (EGD) WITH PROPOFOL ;  Surgeon: Brice Campi Albino Alu., MD;  Location: [facility] ENDOSCOPY;  Service: Gastroenterology;  Laterality: N/A;   GIVENS CAPSULE STUDY N/A 09/23/2021    Procedure: GIVENS CAPSULE STUDY;  Surgeon: Annis Kinder, DO;  Location: [facility] ENDOSCOPY;  Service: Gastroenterology;  Laterality: N/A;   graft left arm Left      for dialysis x 2. Removed   HOT HEMOSTASIS   11/18/2020    Procedure: HOT HEMOSTASIS (ARGON PLASMA COAGULATION/BICAP);  Surgeon: Elois Hair, MD;  Location: [facility] ENDOSCOPY;  Service: Gastroenterology;;   HOT HEMOSTASIS N/A 09/23/2021  Procedure: HOT HEMOSTASIS (ARGON PLASMA COAGULATION/BICAP);  Surgeon: Annis Kinder, DO;  Location: [facility] ENDOSCOPY;  Service: Gastroenterology;  Laterality: N/A;   INSERTION OF DIALYSIS CATHETER        Rt chest   LAPAROSCOPIC RIGHT COLECTOMY N/A 08/05/2015    Procedure: LAPAROSCOPIC RIGHT COLECTOMY- ASCENDING;  Surgeon: Lockie Rima, MD;  Location: [facility] OR;  Service: General;  Laterality: N/A;   MASS EXCISION Left 05/28/2019    Procedure: EXCISION SOFT TISSUE MASS LEFT SHOULDER;  Surgeon: Oralee Billow, MD;  Location: [facility] ORS;  Service: General;  Laterality: Left;   NEPHRECTOMY Bilateral     PARATHYROIDECTOMY N/A 06/12/2016    Procedure: TOTAL PARATHYROIDECTOMY WITH AUTOTRANSPLANTATION TO LEFT FOREARM;  Surgeon: Oralee Billow, MD;  Location: [facility] OR;  Service: General;  Laterality: N/A;   PARATHYROIDECTOMY N/A 10/23/2016    Procedure: PARATHYROIDECTOMY;  Surgeon: Oralee Billow, MD;  Location: [facility] OR;  Service: General;  Laterality: N/A;   REVERSE SHOULDER ARTHROPLASTY Right 08/24/2020    Procedure: REVERSE SHOULDER ARTHROPLASTY;  Surgeon: Micheline Ahr, MD;  Location: [facility] OR;  Service: Orthopedics;  Laterality: Right;   REVISON OF ARTERIOVENOUS FISTULA Right 07/16/2017    Procedure: REVISION OF ARTERIOVENOUS FISTULA  Right ARM;  Surgeon: Adine Hoof, MD;  Location: [facility] OR;  Service: Vascular;   Laterality: Right;   TOTAL HIP ARTHROPLASTY Left 11/14/2020    Procedure: LEFT TOTAL HIP ARTHROPLASTY ANTERIOR APPROACH;  Surgeon: Wes Hamman, MD;  Location: [facility] OR;  Service: Orthopedics;  Laterality: Left;   UPPER GASTROINTESTINAL ENDOSCOPY   2021    @ [facility]               Family History  Problem Relation Age of Onset   Diabetes Father     Stroke Father     Hypertension Father     Uterine cancer Mother     Lupus Sister     Stroke Sister     Hypertension Sister     Anuerysm Brother          brain   Colon cancer Neg Hx     Esophageal cancer Neg Hx     Stomach cancer Neg Hx     Pancreatic cancer Neg Hx     Liver disease Neg Hx     Colon polyps Neg Hx     Rectal cancer Neg Hx            Social History:  Widowed--wife died of cancer last year. He lives with son. He reports that he quit smoking about 32 years ago. His smoking use included cigarettes. He has never used smokeless tobacco. He reports that he does not currently use alcohol after a past usage of about 1.0 standard drink of alcohol per week. He reports that he does not currently use drugs after having used the following drugs: Marijuana.     Allergies       Allergies  Allergen Reactions   Infed [Iron Dextran] Other (See Comments)      Decreased BP    Vicodin [Hydrocodone -Acetaminophen ] Other (See Comments)      Decreased BP   Diclofenac Other (See Comments)      Caused hypotension              Medications Prior to Admission  Medication Sig Dispense Refill   acetaminophen  (TYLENOL ) 650 MG CR tablet Take 1,300 mg by mouth every 8 (eight) hours as needed for pain.       acetaminophen -codeine  (TYLENOL  #3) 300-30 MG  tablet Take 1 tablet by mouth every 6 (six) hours as needed for moderate pain (pain score 4-6). 30 tablet 0   augmented betamethasone  dipropionate (DIPROLENE -AF) 0.05 % ointment Apply 1 Application topically 2 (two) times daily as needed for rash.       cinacalcet  (SENSIPAR ) 60 MG tablet Take 120 mg  by mouth daily with supper.       cromolyn  (OPTICROM ) 4 % ophthalmic solution Place 1 drop into both eyes daily as needed (redness).       Docusate Sodium  (DSS) 100 MG CAPS Take 100 mg by mouth daily as needed (constipation.).       folic acid  (FOLVITE ) 1 MG tablet Take 1 tablet (1 mg total) by mouth daily. 30 tablet 0   LOKELMA  10 g PACK packet Take 1 packet by mouth daily.       multivitamin (RENA-VIT) TABS tablet Take 1 tablet by mouth daily.       pantoprazole  (PROTONIX ) 40 MG tablet Take 1 tablet (40 mg total) by mouth 2 (two) times daily before a meal. (Patient taking differently: Take 40 mg by mouth daily as needed (for acid reflux).) 60 tablet 1   sevelamer  carbonate (RENVELA ) 800 MG tablet Take 3,200-4,000 mg by mouth 3 (three) times daily with meals.       sucralfate  (CARAFATE ) 1 GM/10ML suspension Take 10 mLs (1 g total) by mouth 2 (two) times daily. (Patient taking differently: Take 1 g by mouth 2 (two) times daily as needed (indigestion/upset stomach.).) 420 mL 0   traMADol  (ULTRAM ) 50 MG tablet TAKE 1 TABLET BY MOUTH EVERY 12 HOURS AS NEEDED 30 tablet 0   vitamin E 180 MG (400 UNITS) capsule Take 400 Units by mouth 2 (two) times a week.       [DISCONTINUED] atorvastatin  (LIPITOR) 10 MG tablet Take 10 mg by mouth every Thursday.       amoxicillin  (AMOXIL ) 500 MG capsule Take 4 pills one hour prior to dental work (Patient not taking: Reported on 05/06/2023) 12 capsule 3   oxyCODONE -acetaminophen  (PERCOCET/ROXICET) 5-325 MG tablet Take 1-2 tablets by mouth every 6 (six) hours as needed. (Patient not taking: Reported on 05/06/2023)       vitamin B-12 (CYANOCOBALAMIN ) 1000 MCG tablet Take 1,000 mcg by mouth 2 (two) times a week.       [DISCONTINUED] gabapentin  (NEURONTIN ) 100 MG capsule Take 300 mg by mouth at bedtime. (Patient not taking: Reported on 05/25/2023)                Home: Home Living Family/patient expects to be discharged to:: Skilled nursing facility Living Arrangements:   (son) Available Help at Discharge: Family, Available 24 hours/day Type of Home: Apartment Home Access: Stairs to enter [employer] of Steps:  (second floor apartment) Entrance Stairs-Rails: Right, Left Home Layout: One level Bathroom Shower/Tub: Engineer, manufacturing systems: Standard Bathroom Accessibility: Yes Home Equipment: Cane - single point, Hand held shower head, Other (comment), Toilet riser, Rollator (4 wheels), Rolling Walker (2 wheels), BSC/3in1 (fall mat in tub shower)  Lives With: Son   Functional History: Prior Function Prior Level of Function : Independent/Modified Independent, History of Falls (last six months), Driving Mobility Comments: uses cane for mobility, multiple falls on stairs in last 3 months. ADLs Comments: Indep with ADLs, IADLs. was previously driving to/from HD but due to RLE difficulties has reduced driving   Functional Status:  Mobility: Bed Mobility Overal bed mobility: Needs Assistance Bed Mobility: Rolling, Sidelying to Sit Rolling: Contact  guard assist Sidelying to sit: Contact guard assist, HOB elevated Sit to supine: +2 for safety/equipment, Min assist, HOB elevated, +2 for physical assistance General bed mobility comments: bed mobility completed with CGA for safety, required increased time but ultimately able to complete without physical assistance. Transfers Overall transfer level: Needs assistance Equipment used: Ambulation equipment used Transfers: Sit to/from Stand Sit to Stand: +2 physical assistance, +2 safety/equipment, Min assist, Via lift equipment Bed to/from chair/wheelchair/BSC transfer type:: Step pivot Step pivot transfers: Max assist, +2 physical assistance, +2 safety/equipment Transfer via Lift Equipment:  (sara plus) General transfer comment: pt used Abraham Hoffmann plus to assist with standing and required min A +2 to power up, LUE bandaged to forearm support to aid in keeping arm secure and from  sliding Ambulation/Gait Ambulation/Gait assistance: +2 physical assistance, +2 safety/equipment, Mod assist (+4) Gait Distance (Feet): 36 Feet (x4 bouts of ~6 ft > ~30 ft > ~36 ft > ~10 ft) Assistive device:  (Sara Plus) Gait Pattern/deviations: Decreased stance time - right, Knees buckling, Decreased step length - right, Decreased dorsiflexion - right, Trunk flexed, Knee flexed in stance - right, Decreased weight shift to right, Decreased weight shift to left General Gait Details: +4 assist needed to guide Abraham Hoffmann Plus, support pt while blocking his R UE inside and on the platform (L UE ACE wrapped to platform for same reason), provide chair follow, and provide support and tactile and verbal cues to weight shift bil, extend knees during stance (R>L), bring feet apart when taking steps (L>R), and advance foot when stepping on R. Pt providing good initiation and active participation of >50% but needs modAx4 for all these components. His ability to lift and swing his R leg and extend his L knee during stance improved as distance progressed. Gait velocity: reduced Gait velocity interpretation: <1.31 ft/sec, indicative of household ambulator Pre-gait activities: Cued pt to step forward then back with each foot, x >10 reps each leg. Pt needed cues to push through his elbows on the bil platform walker to extend his trunk and stand more upright. Tactile and verbal cues provided to shift weight bil, extend the knee of the leg in stance phase while stepping forward/back with the contralateral leg (buckles more on R than L), and lifting his R leg to clear and take steps. Good carryover noted with faded feedback for these cues. MaxAx2 needed for balance and cuing and assisting R leg > L for swing phase and for knee extension in stance phase.   ADL: ADL Overall ADL's : Needs assistance/impaired Eating/Feeding: Minimal assistance, Sitting, With adaptive utensils Eating/Feeding Details (indicate cue type and reason):  provided built up grip to use with LUE and pt able to perform with intermittent min A. Attempted to provide ucuff for RUE as pt reported he typically self feeds R handed, but pt with poor motor control at shoulder and with pronation/supination, and shoulder flexion, internal and external rotation to be able to scoop food and bring to mouth, mildly jerky movement also seems more due to weakness than ataxia Grooming: Wash/dry face, Contact guard assist, Supervision/safety, Sitting Grooming Details (indicate cue type and reason): used LUE to wash face while seated Upper Body Bathing: Moderate assistance, Sitting Lower Body Bathing: Maximal assistance, +2 for physical assistance, +2 for safety/equipment, Sitting/lateral leans, Sit to/from stand Upper Body Dressing : Moderate assistance, Sitting Lower Body Dressing: Total assistance, Sitting/lateral leans Lower Body Dressing Details (indicate cue type and reason): to don socks Toilet Transfer: Moderate assistance, +2 for physical assistance, +  2 for safety/equipment, Tax adviser Details (indicate cue type and reason): LLE knee block Toileting- Clothing Manipulation and Hygiene: +2 for safety/equipment, +2 for physical assistance, Total assistance, Sit to/from stand, Sitting/lateral lean Functional mobility during ADLs: +2 for safety/equipment, +2 for physical assistance, Moderate assistance General ADL Comments: session focused on improving functional mobility with PT   Cognition: Cognition Orientation Level: Oriented X4 Cognition Arousal: Alert Behavior During Therapy: [facility] for tasks assessed/performed     Blood pressure 106/83, pulse 90, temperature 97.9 F (36.6 C), temperature source Oral, resp. rate 18, height 6\' 1"  (1.854 m), weight 86.5 kg, SpO2 96%.  Physical Exam Vitals and nursing note reviewed.  Constitutional: No apparent distress. Appropriate appearance for age.  HENT: + Cervical collar. Anterior neck honecomb  dressing without drainage.  Eyes: PERRLA. EOMI. Visual fields grossly intact.  Cardiovascular: RRR, no murmurs/rub/gallops. No Edema. Peripheral pulses 2+  Respiratory: CTAB. No rales, rhonchi, or wheezing. On RA.  Abdomen: + bowel sounds, normoactive. No distention or tenderness.  GU: Not examined.   Skin: C/D/I. Multiple lesions on BL legs with various stages of healing.  + RUE fistula - intact  MSK:      No apparent deformity. Full PROM UE      LLE knee with palpable effusion, no warmth, no ttp       Neurologic exam:  Cognition: AAO to person, place, time and event.  Language: Fluent, No substitutions or neoglisms. No dysarthria. Names 3/3 objects correctly.  Memory: Recalls 3/3 objects at 5 minutes. No apparent deficits  Insight: Good  insight into current condition.  Mood: Pleasant affect, appropriate mood.  Sensation: Reduced to light touch in distal right lower extremity to the mid-thigh Reflexes: 2+ in BL UE and LEs. Negative Hoffman's and babinski signs bilaterally.  CN: 2-12 grossly intact.  Coordination: No apparent tremors.  Spasticity: MAS 0 in all extremities.       Strength:                RUE: 2/5 SA, 2/5 EF, 2/5 EE, 2/5 WE, 2/5 FF, 2/5 FA                LUE:  3/5 SA, 3/5 EF, 3/5 EE, 3/5 WE, 3/5 FF, 3/5 FA                RLE: 2/5 HF, 2/5 KE, 2/5  DF, 2/5  EHL, 2/5  PF                 LLE:  3/5 HF, 4/5 KE, 4/5  DF, 4/5  EHL, 4/5  PF           Lab Results Last 48 Hours        Results for orders placed or performed during the hospital encounter of 05/25/23 (from the past 48 hours)  MRSA Next Gen by PCR, Nasal     Status: None    Collection Time: 05/29/23  4:40 PM    Specimen: Nasal Mucosa; Nasal Swab  Result Value Ref Range    MRSA by PCR Next Gen NOT DETECTED NOT DETECTED      Comment: (NOTE) The GeneXpert MRSA Assay (FDA approved for NASAL specimens only), is one component of a comprehensive MRSA colonization surveillance program. It is not intended to  diagnose MRSA infection nor to guide or monitor treatment for MRSA infections. Test performance is not FDA approved in patients less than 39 years old. Performed at [facility], [address] [place], [place] [address]    Renal function panel     Status: Abnormal    Collection Time: 05/29/23  9:50 PM  Result Value Ref Range    Sodium 130 (L) 135 - 145 mmol/L    Potassium 5.5 (H) 3.5 - 5.1 mmol/L    Chloride 92 (L) 98 - 111 mmol/L    CO2 24 22 - 32 mmol/L    Glucose, Bld 108 (H) 70 - 99 mg/dL      Comment: Glucose reference range applies only to samples taken after fasting for at least 8 hours.    BUN 47 (H) 8 - 23 mg/dL    Creatinine, Ser 6.04 (H) 0.61 - 1.24 mg/dL    Calcium  8.8 (L) 8.9 - 10.3 mg/dL    Phosphorus 6.2 (H) 2.5 - 4.6 mg/dL    Albumin  2.9 (L) 3.5 - 5.0 g/dL    GFR, Estimated 6 (L) >60 mL/min      Comment: (NOTE) Calculated using the CKD-EPI Creatinine Equation (2021)      Anion gap 14 5 - 15      Comment: Performed at [facility], [address], [place], [place] [address]  CBC     Status: Abnormal    Collection Time: 05/29/23  9:55 PM  Result Value Ref Range    WBC 8.2 4.0 - 10.5 K/uL    RBC 3.27 (L) 4.22 - 5.81 MIL/uL    Hemoglobin 9.3 (L) 13.0 - 17.0 g/dL    HCT 11.9 (L) 14.7 - 52.0 %    MCV 90.2 80.0 - 100.0 fL    MCH 28.4 26.0 - 34.0 pg    MCHC 31.5 30.0 - 36.0 g/dL    RDW 82.9 (H) 56.2 - 15.5 %    Platelets 266 150 - 400 K/uL    nRBC 0.0 0.0 - 0.2 %      Comment: Performed at [facility], [address] [place], [place] [address]      Imaging Results (Last 48 hours)  No results found.         Blood pressure 106/83, pulse 90, temperature 97.9 F (36.6 C), temperature source Oral, resp. rate 18, height 6\' 1"  (1.854 m), weight 86.5 kg, SpO2 96%.   Medical Problem List and Plan: 1. Functional deficits secondary to C4 SCI s/p Corpectomy 4/13 with Dr Gwendlyn Lemmings             -patient may shower without brace with surgical dressing  covered   - C collar apply/remove brace in sitting, may remove when in bed and to shower and may ambulate to bathroom without brace              -ELOS/Goals: 20-25 days, Min A PT/OT  2.  Antithrombotics: -DVT/anticoagulation:  Mechanical: Sequential compression devices, below knee Bilateral lower extremities             -antiplatelet therapy: N/A--no antiplatelet due to hx of recurrent GIB 3. Pain Management:  oxycodone  prn. Gabapentin  300 mg/hs.              --has Flexeril  10 mg TID prn   - patient with intermittent, severe b/l headache - add topomax 25 mg BID   4. Mood/Behavior/Sleep: LCSW to follow for evaluation and support             -antipsychotic agents: N/A             --melatonin prn for insomnia.  5. Neuropsych/cognition: This patient  is capable of making decisions on his own behalf. 6. Skin/Wound Care: Routine pressure relief measures.  7. Fluids/Electrolytes/Nutrition: Strict I/O. Daily weights. Labs with HD. 8. ESRD- HD MWF: Schedule HD at the end of the day to help with tolerance of therapy             --Sensipar  and Renvela  for metabolic bone disease.  9. Anemia of chronic disease: Monitor H/H 10. H/o recurrent GIB/Jehovah's Witness: Continue Protonix  BID 11. H/o Hypotension: Has had drop  in BP with HD. Now cervical SCI.  --Monitor for symptoms day after HD and with activity. 12. Constipation: Small BM on 04/14 --had of nulytey yesterday and another today? 13. ?left knee effusion. Not currently bothersome. Consider imaging.      Zelda Hickman, PA-C 05/30/2023  I have examined the patient independently and edited the note for HPI, ROS, exam, assessment, and plan as appropriate. I am in agreement with the above recommendations.   Bea Lime, DO 05/31/2023

## 2023-05-30 NOTE — Plan of Care (Signed)
  Problem: Education: Goal: Ability to verbalize activity precautions or restrictions will improve Outcome: Progressing Goal: Knowledge of the prescribed therapeutic regimen will improve Outcome: Progressing Goal: Understanding of discharge needs will improve Outcome: Progressing   Problem: Activity: Goal: Ability to avoid complications of mobility impairment will improve Outcome: Progressing Goal: Ability to tolerate increased activity will improve Outcome: Progressing Goal: Will remain free from falls Outcome: Progressing   Problem: Bowel/Gastric: Goal: Gastrointestinal status for postoperative course will improve Outcome: Progressing

## 2023-05-30 NOTE — Progress Notes (Signed)
 Inpatient Rehabilitation Admission Medication Review by a Pharmacist  A complete drug regimen review was completed for this patient to identify any potential clinically significant medication issues.  High Risk Drug Classes Is patient taking? Indication by Medication  Antipsychotic Yes, as an intravenous medication Compazine- n/v   Anticoagulant Yes Sodium citrate, heparin- dialysis line patency   Antibiotic No   Opioid Yes Percocet PRN- acute pain  Antiplatelet No   Hypoglycemics/insulin No   Vasoactive Medication No   Chemotherapy No   Other Yes sorbitol , PEG , Senokot-S , and - constipation Milk and molasses enema- constipation Pantoprazole- reflux  Diphenhydramine- itching  Acetaminophen- pain  Robitussin- cough   melatonin and -insomnia Lipitor- HLD  Carbamide ear gtt-  Cinacalcet, renvela- CKD MBD  Gabapentin- neuropathy  Cyclobenzaprine- muscle spasms      Type of Medication Issue Identified Description of Issue Recommendation(s)  Drug Interaction(s) (clinically significant)     Duplicate Therapy     Allergy     No Medication Administration End Date     Incorrect Dose     Additional Drug Therapy Needed     Significant med changes from prior encounter (inform family/care partners about these prior to discharge). PTA meds not resumed- cromolyn ophthalmic solution 1gtt both eyes daily PRN, sucralfate 1g BID PRN (patient reported not taking and is ESRD), lokelma 10g daily (not taking while inpt- prescribed on discharge), cyanocobalamin 1,000 mcg 2x weekly, tramadol 50 Q12H PRN   Communicate relevant medication changes to patient/family members at discharge from CIR.   Restart or discontinue PTA meds not resumed in CIR at discharge if clinically indicated.   Other       Clinically significant medication issues were identified that warrant physician communication and completion of prescribed/recommended actions by midnight of the next day:  No  Name of provider  notified for urgent issues identified:   Provider Method of Notification:     Pharmacist comments:   Time spent performing this drug regimen review (minutes):  30  Chrystie Crass, PharmD Clinical Pharmacist  05/30/2023 1:06 PM

## 2023-05-30 NOTE — Progress Notes (Signed)
   05/30/23 0145  Vitals  Temp 97.6 F (36.4 C)  Pulse Rate 96  Resp 14  BP (!) 148/89  SpO2 100 %  Weight 86.5 kg  Type of Weight Post-Dialysis  Post Treatment  Dialyzer Clearance Clear  Hemodialysis Intake (mL) 0 mL  Liters Processed 84  Fluid Removed (mL) 1500 mL  Tolerated HD Treatment Yes  AVG/AVF Arterial Site Held (minutes) 8 minutes  AVG/AVF Venous Site Held (minutes) 8 minutes   Received patient in bed to unit.  Alert and oriented.  Informed consent signed and in chart.   TX duration:3.5HRS  Patient tolerated well.  Transported back to the room  Alert, without acute distress.  Hand-off given to patient's nurse.   Access used: RFA AVF Access issues: NONE  Total UF removed: 1.5L Medication(s) given: NONE   [facility]

## 2023-05-30 NOTE — H&P (Incomplete)
 Physical Medicine and Rehabilitation Admission H&P        Chief Complaint  Patient presents with  . Functional deficits      HPI:  [name] is a 63 year old male (Jehovah's witness but has accepted blood products in the past)  with history of ESRD-HD MWF, duodenitis/gastritis w/recurrent GIB, GERD, seizure, HTN, OSA, RCC s/p B-lap nephrectomies, cerebral venous stenosis, CVA w/o residual deficits, chronic HA who has had progressive numbness with pain BUE, right foot drop X 2 weeks and balance issues who fell due to knees giving away and struck his face. He was unable to lift his legs and had weakness of BUE and presented to ED for evaluation on 05/25/23. MRI brain showed chronic L-PCA stroke iwht large left mastoid and middle ear effusion. CT C spine showed subacute C4  burst fracture with severe canal stenosis superimposed on  C4/5 & C5-6  advanced spondylosis with cord edema and/or evolving myelomalacia C3-C6. He was evaluated by Dr. Jordan Likes, found to have tetraplegia BUE>BLE and was taken to OR emergently  for C4 anterior corpectomy with C3-C5 anterior fusion on 05/26/23 am. Post procedure HD ongoing and home gabapentin resumed. PT/OT  has been working with patient who is showing improvement in balance at EOB with close guard assist for safety and posterior lean. He requires +2 assist with Stedy for mobility and ADLs.    He was independent and driving PTA but having difficulty with RLE and RUE so was planning on stopping. CIR recommended due to functional decline.      Review of Systems  HENT:  Negative for hearing loss.   Eyes:  Negative for blurred vision.  Respiratory:  Negative for cough and shortness of breath.   Cardiovascular:  Negative for chest pain.  Gastrointestinal:  Positive for constipation. Negative for heartburn.  Genitourinary:        Does not produce urine  Musculoskeletal:  Positive for falls (fell down a flight of stairs a few weeks ago.).  Skin:  Negative  for rash.  Neurological:  Positive for sensory change, speech change and weakness.  Psychiatric/Behavioral:  The patient does not have insomnia.             Past Medical History:  Diagnosis Date  . Acute gastric ulcer with hemorrhage    . Acute GI bleeding 07/31/2019  . Acute pericarditis    . Anemia      hx of  . Anxiety      situational   . Arthritis      on meds  . Borderline diabetes    . Cataract      bilateral sx  . Chronic headaches    . Depression      situational   . Diverticulitis    . ESRD (end stage renal disease) (HCC)       On Renal Transplant List," [facility]; MWF" (10/23/2016)  . GERD (gastroesophageal reflux disease)      with certain foods  . GI bleed    . Hypertension      diet controlled  . 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 Parathyroid abnormality (HCC)      ectopic parathyroid gland  . Presence of arteriovenous fistula for hemodialysis, primary (HCC)      RUE PER PT RLE  . Refusal of blood product      NO WHOLE BLOOD PROUCTS  . Renal cell carcinoma (HCC)      s/p hand  assisted laparoscopic bilateral nephrectomies 11/29/17, + RCC left  . Secondary hyperparathyroidism (HCC)    . Seizures (HCC)      one episode in past, due to" elevated Potassium" 08/02/20- "at least 4 years ago"  . Sleep apnea      doesn't use CPAP anymore since weight loss  . Stroke [facility])      no residual               Past Surgical History:  Procedure Laterality Date  . ANTERIOR CERVICAL CORPECTOMY N/A 05/25/2023    Procedure: ANTERIOR CERVICAL CORPECTOMY CERVICAL FOUR, CERVICAL THREE - CERVICAL FIVE FUSION;  Surgeon: Julio Sicks, MD;  Location: [facility] OR;  Service: Neurosurgery;  Laterality: N/A;  . AV FISTULA PLACEMENT Right      right arm  . BIOPSY   08/01/2019    Procedure: BIOPSY;  Surgeon: Charna Elizabeth, MD;  Location: [facility] ENDOSCOPY;  Service: Endoscopy;;  . BIOPSY   11/18/2020    Procedure: BIOPSY;  Surgeon: Jenel Lucks, MD;  Location: [facility] ENDOSCOPY;   Service: Gastroenterology;;  . BIOPSY   09/13/2021    Procedure: BIOPSY;  Surgeon: Lemar Lofty., MD;  Location: [facility] ENDOSCOPY;  Service: Gastroenterology;;  . CATARACT EXTRACTION W/ INTRAOCULAR LENS  IMPLANT, BILATERAL      . COLON SURGERY      . COLONOSCOPY N/A 08/04/2015    Procedure: COLONOSCOPY;  Surgeon: Beverley Fiedler, MD;  Location: [facility] ENDOSCOPY;  Service: Endoscopy;  Laterality: N/A;  . COLONOSCOPY   2017    JMP@ [facility]-good prep-mass -recall 1 yr  . COLONOSCOPY N/A 09/13/2021    Procedure: COLONOSCOPY;  Surgeon: Mansouraty, Netty Starring., MD;  Location: [facility] ENDOSCOPY;  Service: Gastroenterology;  Laterality: N/A;  . COLONOSCOPY WITH PROPOFOL N/A 11/25/2020    Procedure: COLONOSCOPY WITH PROPOFOL;  Surgeon: Imogene Burn, MD;  Location: [facility] ENDOSCOPY;  Service: Gastroenterology;  Laterality: N/A;  . COLONOSCOPY WITH PROPOFOL N/A 09/23/2021    Procedure: COLONOSCOPY WITH PROPOFOL;  Surgeon: Shellia Cleverly, DO;  Location: [facility] ENDOSCOPY;  Service: Gastroenterology;  Laterality: N/A;  . ENTEROSCOPY N/A 09/23/2021    Procedure: ENTEROSCOPY;  Surgeon: Shellia Cleverly, DO;  Location: [facility] ENDOSCOPY;  Service: Gastroenterology;  Laterality: N/A;  Will place order for video capsule study as we may opt to place it during procedure  . ESOPHAGOGASTRODUODENOSCOPY N/A 08/01/2019    Procedure: ESOPHAGOGASTRODUODENOSCOPY (EGD);  Surgeon: Charna Elizabeth, MD;  Location: [facility] ENDOSCOPY;  Service: Endoscopy;  Laterality: N/A;  . ESOPHAGOGASTRODUODENOSCOPY N/A 11/18/2020    Procedure: ESOPHAGOGASTRODUODENOSCOPY (EGD);  Surgeon: Jenel Lucks, MD;  Location: [facility] ENDOSCOPY;  Service: Gastroenterology;  Laterality: N/A;  . ESOPHAGOGASTRODUODENOSCOPY (EGD) WITH PROPOFOL N/A 08/04/2019    Procedure: ESOPHAGOGASTRODUODENOSCOPY (EGD) WITH PROPOFOL;  Surgeon: Benancio Deeds, MD;  Location: [facility] ENDOSCOPY;  Service: Gastroenterology;  Laterality: N/A;  . ESOPHAGOGASTRODUODENOSCOPY (EGD) WITH PROPOFOL N/A  09/13/2021    Procedure: ESOPHAGOGASTRODUODENOSCOPY (EGD) WITH PROPOFOL;  Surgeon: Meridee Score Netty Starring., MD;  Location: [facility] ENDOSCOPY;  Service: Gastroenterology;  Laterality: N/A;  . GIVENS CAPSULE STUDY N/A 09/23/2021    Procedure: GIVENS CAPSULE STUDY;  Surgeon: Shellia Cleverly, DO;  Location: [facility] ENDOSCOPY;  Service: Gastroenterology;  Laterality: N/A;  . graft left arm Left      for dialysis x 2. Removed  . HOT HEMOSTASIS   11/18/2020    Procedure: HOT HEMOSTASIS (ARGON PLASMA COAGULATION/BICAP);  Surgeon: Jenel Lucks, MD;  Location: [facility] ENDOSCOPY;  Service: Gastroenterology;;  . HOT HEMOSTASIS N/A 09/23/2021  Procedure: HOT HEMOSTASIS (ARGON PLASMA COAGULATION/BICAP);  Surgeon: Annis Kinder, DO;  Location: [facility] ENDOSCOPY;  Service: Gastroenterology;  Laterality: N/A;  . INSERTION OF DIALYSIS CATHETER        Rt chest  . LAPAROSCOPIC RIGHT COLECTOMY N/A 08/05/2015    Procedure: LAPAROSCOPIC RIGHT COLECTOMY- ASCENDING;  Surgeon: Lockie Rima, MD;  Location: [facility] OR;  Service: General;  Laterality: N/A;  . MASS EXCISION Left 05/28/2019    Procedure: EXCISION SOFT TISSUE MASS LEFT SHOULDER;  Surgeon: Oralee Billow, MD;  Location: [facility] ORS;  Service: General;  Laterality: Left;  . NEPHRECTOMY Bilateral    . PARATHYROIDECTOMY N/A 06/12/2016    Procedure: TOTAL PARATHYROIDECTOMY WITH AUTOTRANSPLANTATION TO LEFT FOREARM;  Surgeon: Oralee Billow, MD;  Location: [facility] OR;  Service: General;  Laterality: N/A;  . PARATHYROIDECTOMY N/A 10/23/2016    Procedure: PARATHYROIDECTOMY;  Surgeon: Oralee Billow, MD;  Location: [facility] OR;  Service: General;  Laterality: N/A;  . REVERSE SHOULDER ARTHROPLASTY Right 08/24/2020    Procedure: REVERSE SHOULDER ARTHROPLASTY;  Surgeon: Micheline Ahr, MD;  Location: [facility] OR;  Service: Orthopedics;  Laterality: Right;  . REVISON OF ARTERIOVENOUS FISTULA Right 07/16/2017    Procedure: REVISION OF ARTERIOVENOUS FISTULA  Right ARM;  Surgeon: Adine Hoof, MD;   Location: [facility] OR;  Service: Vascular;  Laterality: Right;  . TOTAL HIP ARTHROPLASTY Left 11/14/2020    Procedure: LEFT TOTAL HIP ARTHROPLASTY ANTERIOR APPROACH;  Surgeon: Wes Hamman, MD;  Location: [facility] OR;  Service: Orthopedics;  Laterality: Left;  . UPPER GASTROINTESTINAL ENDOSCOPY   2021    @ [facility]               Family History  Problem Relation Age of Onset  . Diabetes Father    . Stroke Father    . Hypertension Father    . Uterine cancer Mother    . Lupus Sister    . Stroke Sister    . Hypertension Sister    . Anuerysm Brother          brain  . Colon cancer Neg Hx    . Esophageal cancer Neg Hx    . Stomach cancer Neg Hx    . Pancreatic cancer Neg Hx    . Liver disease Neg Hx    . Colon polyps Neg Hx    . Rectal cancer Neg Hx            Social History:  Widowed--wife died of cancer last year. He lives with son. He reports that he quit smoking about 32 years ago. His smoking use included cigarettes. He has never used smokeless tobacco. He reports that he does not currently use alcohol after a past usage of about 1.0 standard drink of alcohol per week. He reports that he does not currently use drugs after having used the following drugs: Marijuana.     Allergies       Allergies  Allergen Reactions  . Infed [Iron Dextran] Other (See Comments)      Decreased BP   . Vicodin [Hydrocodone-Acetaminophen] Other (See Comments)      Decreased BP  . Diclofenac Other (See Comments)      Caused hypotension              Medications Prior to Admission  Medication Sig Dispense Refill  . acetaminophen (TYLENOL) 650 MG CR tablet Take 1,300 mg by mouth every 8 (eight) hours as needed for pain.      Aaron Aas acetaminophen-codeine (TYLENOL #3) 300-30 MG  tablet Take 1 tablet by mouth every 6 (six) hours as needed for moderate pain (pain score 4-6). 30 tablet 0  . augmented betamethasone dipropionate (DIPROLENE-AF) 0.05 % ointment Apply 1 Application topically 2 (two) times daily as needed for  rash.      . cinacalcet (SENSIPAR) 60 MG tablet Take 120 mg by mouth daily with supper.      . cromolyn (OPTICROM) 4 % ophthalmic solution Place 1 drop into both eyes daily as needed (redness).      Tery Sanfilippo Sodium (DSS) 100 MG CAPS Take 100 mg by mouth daily as needed (constipation.).      Marland Kitchen folic acid (FOLVITE) 1 MG tablet Take 1 tablet (1 mg total) by mouth daily. 30 tablet 0  . LOKELMA 10 g PACK packet Take 1 packet by mouth daily.      . multivitamin (RENA-VIT) TABS tablet Take 1 tablet by mouth daily.      . pantoprazole (PROTONIX) 40 MG tablet Take 1 tablet (40 mg total) by mouth 2 (two) times daily before a meal. (Patient taking differently: Take 40 mg by mouth daily as needed (for acid reflux).) 60 tablet 1  . sevelamer carbonate (RENVELA) 800 MG tablet Take 3,200-4,000 mg by mouth 3 (three) times daily with meals.      . sucralfate (CARAFATE) 1 GM/10ML suspension Take 10 mLs (1 g total) by mouth 2 (two) times daily. (Patient taking differently: Take 1 g by mouth 2 (two) times daily as needed (indigestion/upset stomach.).) 420 mL 0  . traMADol (ULTRAM) 50 MG tablet TAKE 1 TABLET BY MOUTH EVERY 12 HOURS AS NEEDED 30 tablet 0  . vitamin E 180 MG (400 UNITS) capsule Take 400 Units by mouth 2 (two) times a week.      . [DISCONTINUED] atorvastatin (LIPITOR) 10 MG tablet Take 10 mg by mouth every Thursday.      Marland Kitchen amoxicillin (AMOXIL) 500 MG capsule Take 4 pills one hour prior to dental work (Patient not taking: Reported on 05/06/2023) 12 capsule 3  . oxyCODONE-acetaminophen (PERCOCET/ROXICET) 5-325 MG tablet Take 1-2 tablets by mouth every 6 (six) hours as needed. (Patient not taking: Reported on 05/06/2023)      . vitamin B-12 (CYANOCOBALAMIN) 1000 MCG tablet Take 1,000 mcg by mouth 2 (two) times a week.      . [DISCONTINUED] gabapentin (NEURONTIN) 100 MG capsule Take 300 mg by mouth at bedtime. (Patient not taking: Reported on 05/25/2023)                Home: Home Living Family/patient  expects to be discharged to:: Skilled nursing facility Living Arrangements:  (son) Available Help at Discharge: Family, Available 24 hours/day Type of Home: Apartment Home Access: Stairs to enter [employer] of Steps:  (second floor apartment) Entrance Stairs-Rails: Right, Left Home Layout: One level Bathroom Shower/Tub: Engineer, manufacturing systems: Standard Bathroom Accessibility: Yes Home Equipment: Cane - single point, Hand held shower head, Other (comment), Toilet riser, Rollator (4 wheels), Rolling Walker (2 wheels), BSC/3in1 (fall mat in tub shower)  Lives With: Son   Functional History: Prior Function Prior Level of Function : Independent/Modified Independent, History of Falls (last six months), Driving Mobility Comments: uses cane for mobility, multiple falls on stairs in last 3 months. ADLs Comments: Indep with ADLs, IADLs. was previously driving to/from HD but due to RLE difficulties has reduced driving   Functional Status:  Mobility: Bed Mobility Overal bed mobility: Needs Assistance Bed Mobility: Rolling, Sidelying to Sit Rolling: Contact  guard assist Sidelying to sit: Contact guard assist, HOB elevated Sit to supine: +2 for safety/equipment, Min assist, HOB elevated, +2 for physical assistance General bed mobility comments: bed mobility completed with CGA for safety, required increased time but ultimately able to complete without physical assistance. Transfers Overall transfer level: Needs assistance Equipment used: Ambulation equipment used Transfers: Sit to/from Stand Sit to Stand: +2 physical assistance, +2 safety/equipment, Min assist, Via lift equipment Bed to/from chair/wheelchair/BSC transfer type:: Step pivot Step pivot transfers: Max assist, +2 physical assistance, +2 safety/equipment Transfer via Lift Equipment:  (sara plus) General transfer comment: pt used Huntley Dec plus to assist with standing and required min A +2 to power up, LUE bandaged to  forearm support to aid in keeping arm secure and from sliding Ambulation/Gait Ambulation/Gait assistance: +2 physical assistance, +2 safety/equipment, Mod assist (+4) Gait Distance (Feet): 36 Feet (x4 bouts of ~6 ft > ~30 ft > ~36 ft > ~10 ft) Assistive device:  (Sara Plus) Gait Pattern/deviations: Decreased stance time - right, Knees buckling, Decreased step length - right, Decreased dorsiflexion - right, Trunk flexed, Knee flexed in stance - right, Decreased weight shift to right, Decreased weight shift to left General Gait Details: +4 assist needed to guide Huntley Dec Plus, support pt while blocking his R UE inside and on the platform (L UE ACE wrapped to platform for same reason), provide chair follow, and provide support and tactile and verbal cues to weight shift bil, extend knees during stance (R>L), bring feet apart when taking steps (L>R), and advance foot when stepping on R. Pt providing good initiation and active participation of >50% but needs modAx4 for all these components. His ability to lift and swing his R leg and extend his L knee during stance improved as distance progressed. Gait velocity: reduced Gait velocity interpretation: <1.31 ft/sec, indicative of household ambulator Pre-gait activities: Cued pt to step forward then back with each foot, x >10 reps each leg. Pt needed cues to push through his elbows on the bil platform walker to extend his trunk and stand more upright. Tactile and verbal cues provided to shift weight bil, extend the knee of the leg in stance phase while stepping forward/back with the contralateral leg (buckles more on R than L), and lifting his R leg to clear and take steps. Good carryover noted with faded feedback for these cues. MaxAx2 needed for balance and cuing and assisting R leg > L for swing phase and for knee extension in stance phase.   ADL: ADL Overall ADL's : Needs assistance/impaired Eating/Feeding: Minimal assistance, Sitting, With adaptive  utensils Eating/Feeding Details (indicate cue type and reason): provided built up grip to use with LUE and pt able to perform with intermittent min A. Attempted to provide ucuff for RUE as pt reported he typically self feeds R handed, but pt with poor motor control at shoulder and with pronation/supination, and shoulder flexion, internal and external rotation to be able to scoop food and bring to mouth, mildly jerky movement also seems more due to weakness than ataxia Grooming: Wash/dry face, Contact guard assist, Supervision/safety, Sitting Grooming Details (indicate cue type and reason): used LUE to wash face while seated Upper Body Bathing: Moderate assistance, Sitting Lower Body Bathing: Maximal assistance, +2 for physical assistance, +2 for safety/equipment, Sitting/lateral leans, Sit to/from stand Upper Body Dressing : Moderate assistance, Sitting Lower Body Dressing: Total assistance, Sitting/lateral leans Lower Body Dressing Details (indicate cue type and reason): to don socks Toilet Transfer: Moderate assistance, +2 for physical assistance, +  2 for safety/equipment, Tax adviser Details (indicate cue type and reason): LLE knee block Toileting- Clothing Manipulation and Hygiene: +2 for safety/equipment, +2 for physical assistance, Total assistance, Sit to/from stand, Sitting/lateral lean Functional mobility during ADLs: +2 for safety/equipment, +2 for physical assistance, Moderate assistance General ADL Comments: session focused on improving functional mobility with PT   Cognition: Cognition Orientation Level: Oriented X4 Cognition Arousal: Alert Behavior During Therapy: [facility] for tasks assessed/performed     Blood pressure 106/83, pulse 90, temperature 97.9 F (36.6 C), temperature source Oral, resp. rate 18, height 6\' 1"  (1.854 m), weight 86.5 kg, SpO2 96%. Physical Exam Vitals and nursing note reviewed.  Constitutional: No apparent distress. Appropriate appearance  for age.  HENT: + Cervical collar. Anterior neck honecomb dressing without drainage.  Eyes: PERRLA. EOMI. Visual fields grossly intact.  Cardiovascular: RRR, no murmurs/rub/gallops. No Edema. Peripheral pulses 2+  Respiratory: CTAB. No rales, rhonchi, or wheezing. On RA.  Abdomen: + bowel sounds, normoactive. No distention or tenderness.  GU: Not examined.   Skin: C/D/I. Multiple lesions on BL legs with various stages of healing  MSK:      No apparent deformity. Full PROM UE      LLE knee with palpabl       Neurologic exam:  Cognition: AAO to person, place, time and event. *** Language: Fluent, No substitutions or neoglisms. No dysarthria. Names 3/3 objects correctly. *** Memory: Recalls 3/3 objects at 5 minutes. No apparent deficits *** Insight: Good *** insight into current condition.  Mood: Pleasant affect, appropriate mood.  Sensation: To light touch intact in BL UEs and LEs *** Reflexes: 2+ in BL UE and LEs. Negative Hoffman's and babinski signs bilaterally. *** CN: 2-12 grossly intact. *** Coordination: No apparent tremors. No ataxia on FTN, HTS bilaterally. *** Spasticity: MAS 0 in all extremities. ***      Strength:                RUE: ***/5 SA, ***/5 EF, ***/5 EE, ***/5 WE, ***/5 FF, ***/5 FA                LUE:  ***/5 SA, ***/5 EF, ***/5 EE, ***/5 WE, ***/5 FF, ***/5 FA                RLE: ***/5 HF, ***/5 KE, ***/5  DF, ***/5  EHL, ***/5  PF                 LLE:  ***/5 HF, ***/5 KE, ***/5  DF, ***/5  EHL, ***/5  PF           Lab Results Last 48 Hours        Results for orders placed or performed during the hospital encounter of 05/25/23 (from the past 48 hours)  MRSA Next Gen by PCR, Nasal     Status: None    Collection Time: 05/29/23  4:40 PM    Specimen: Nasal Mucosa; Nasal Swab  Result Value Ref Range    MRSA by PCR Next Gen NOT DETECTED NOT DETECTED      Comment: (NOTE) The GeneXpert MRSA Assay (FDA approved for NASAL specimens only), is one component of a  comprehensive MRSA colonization surveillance program. It is not intended to diagnose MRSA infection nor to guide or monitor treatment for MRSA infections. Test performance is not FDA approved in patients less than 11 years old. Performed at [facility], [address], [place], [place] [address]    Renal  function panel     Status: Abnormal    Collection Time: 05/29/23  9:50 PM  Result Value Ref Range    Sodium 130 (L) 135 - 145 mmol/L    Potassium 5.5 (H) 3.5 - 5.1 mmol/L    Chloride 92 (L) 98 - 111 mmol/L    CO2 24 22 - 32 mmol/L    Glucose, Bld 108 (H) 70 - 99 mg/dL      Comment: Glucose reference range applies only to samples taken after fasting for at least 8 hours.    BUN 47 (H) 8 - 23 mg/dL    Creatinine, Ser 0.45 (H) 0.61 - 1.24 mg/dL    Calcium 8.8 (L) 8.9 - 10.3 mg/dL    Phosphorus 6.2 (H) 2.5 - 4.6 mg/dL    Albumin 2.9 (L) 3.5 - 5.0 g/dL    GFR, Estimated 6 (L) >60 mL/min      Comment: (NOTE) Calculated using the CKD-EPI Creatinine Equation (2021)      Anion gap 14 5 - 15      Comment: Performed at [facility], [address], [place], [place] [address]  CBC     Status: Abnormal    Collection Time: 05/29/23  9:55 PM  Result Value Ref Range    WBC 8.2 4.0 - 10.5 K/uL    RBC 3.27 (L) 4.22 - 5.81 MIL/uL    Hemoglobin 9.3 (L) 13.0 - 17.0 g/dL    HCT 19.1 (L) 47.8 - 52.0 %    MCV 90.2 80.0 - 100.0 fL    MCH 28.4 26.0 - 34.0 pg    MCHC 31.5 30.0 - 36.0 g/dL    RDW 29.5 (H) 62.1 - 15.5 %    Platelets 266 150 - 400 K/uL    nRBC 0.0 0.0 - 0.2 %      Comment: Performed at [facility], [address] [place], [place] [address]      Imaging Results (Last 48 hours)  No results found.         Blood pressure 106/83, pulse 90, temperature 97.9 F (36.6 C), temperature source Oral, resp. rate 18, height 6\' 1"  (1.854 m), weight 86.5 kg, SpO2 96%.   Medical Problem List and Plan: 1. Functional deficits secondary to C4 SCI s/p Corpectomy 4/13 with Dr  Gwendlyn Lemmings             -patient may shower without brace with surgical dressing covered   - C collar apply/remove brace in sitting, may remove when in bed and to shower and may ambulate to bathroom without brace              -ELOS/Goals: 20-25 days  2.  Antithrombotics: -DVT/anticoagulation:  Mechanical: Sequential compression devices, below knee Bilateral lower extremities             -antiplatelet therapy: N/A--no antiplatelet due to hx of recurrent GIB 3. Pain Management:  oxycodone prn. Gabapentin 300 mg/hs.              --has Flexeril 10 mg TID prn 4. Mood/Behavior/Sleep: LCSW to follow for evaluation and support             -antipsychotic agents: N/A             --melatonin prn for insomnia.  5. Neuropsych/cognition: This patient is capable of making decisions on his own behalf. 6. Skin/Wound Care: Routine pressure relief measures.  7. Fluids/Electrolytes/Nutrition: Strict I/O. Daily weights. Labs with HD. 8. ESRD-  HD MWF: Schedule HD at the end of the day to help with tolerance of therapy             --Sensipar and Renvela for metabolic bone disease.  9. Anemia of chronic disease: Monitor H/H 10. H/o recurrent GIB/Jehovah's Witness: Continue Protonix BID 11. H/o Hypotension: Has had drop  in BP with HD. Now cervical SCI.  --Monitor for symptoms day after HD and with activity. 12. Constipation: Small BM on 04/14 --had of nulytey yesterday and another today? 13. ?left knee effusion. Not currently bothersome. Consider imaging.      Zelda Hickman, PA-C 05/30/2023  I have examined the patient independently and edited the note for HPI, ROS, exam, assessment, and plan as appropriate. I am in agreement with the above recommendations.   Bea Lime, DO 05/31/2023

## 2023-05-30 NOTE — Progress Notes (Signed)
 Pt discharging to CIR and report called off to receiving nurse Abraham Hoffmann on 4 west. Pt IV and telemetry removed. Pt care plan and education completed. Pt waiting to be transported off to CIR. P. Amo Kaveh Kissinger RN

## 2023-05-30 NOTE — Progress Notes (Signed)
 Ranelle Oyster, MD  Physician Physical Medicine and Rehabilitation   Consult Note    Signed   Date of Service: 05/28/2023 10:38 AM  Related encounter: ED to Hosp-Admission (Discharged) from 05/25/2023 in [place] [facility] PROGRESSIVE CARE   Signed     Expand All Collapse All  Show:Clear all [x] Written[x] Templated[] Copied  Added by: [x] Ranelle Oyster, MD  [] Hover for details          Physical Medicine and Rehabilitation Consult Reason for Consult: tetraplegia Referring Physician: Mahala Menghini     HPI: Carl Crissman Sr. is a 63 y.o. male with a history of GIB, ESRD on HD who has baseline cervical myelopathy who was walking to his car on 05/25/23 when he became suddenly weak and collapsed. A code stroke was called. CTH demonstrated an acute left PCA infarct. CT of C-spine revealed a  C4 fracture with severe canal compromise signal abnormality of the cord at that level. Pt with UE>LE weakness on exam and neurology felt he presented more as a spinal cord injury than a stroke. Dr. Jordan Likes was consulted and took the patient to the OR on 4/13 for C3-C5 ACDF. Pt has been seen by therapy and as of 4/13 as a max assist for sit-std transfers and  was only able to march in STEDY while OOB. Pt was mod-max assist with ADL's with OT. Hgb down to 8.4 post-op. Hgb being monitored, no anticoagulants or platelets d/t hx of GIB. CPAP for OSA.  Pt had a continent bm last night and foley is in place.      Review of Systems  Constitutional: Negative.   HENT: Negative.    Eyes: Negative.   Respiratory: Negative.    Cardiovascular: Negative.   Gastrointestinal: Negative.   Genitourinary: Negative.   Musculoskeletal:  Positive for joint pain, myalgias and neck pain.  Skin: Negative.   Neurological:  Positive for sensory change, focal weakness and weakness.  Psychiatric/Behavioral:  Negative for depression.         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     Parathyroid abnormality (HCC)      ectopic parathyroid gland   Presence of arteriovenous fistula for hemodialysis, primary (HCC)      RUE PER PT RLE   Refusal of blood product      NO WHOLE BLOOD PROUCTS   Renal cell carcinoma (HCC)      s/p hand assisted laparoscopic bilateral nephrectomies 11/29/17, + RCC left   Secondary hyperparathyroidism (HCC)     Seizures (HCC)      one episode in past, due to" elevated Potassium" 08/02/20- "at least 4 years ago"  Sleep apnea      doesn't use CPAP anymore since weight loss   Stroke [facility])      no residual             Past Surgical History:  Procedure Laterality Date   ANTERIOR CERVICAL CORPECTOMY N/A 05/25/2023    Procedure: ANTERIOR CERVICAL CORPECTOMY CERVICAL FOUR, CERVICAL THREE - CERVICAL FIVE FUSION;  Surgeon: [name], MD;  Location: [facility] OR;  Service: Neurosurgery;  Laterality: N/A;   AV FISTULA PLACEMENT Right      right arm   BIOPSY   08/01/2019    Procedure: BIOPSY;  Surgeon: Tami Falcon, MD;  Location: [facility] ENDOSCOPY;  Service: Endoscopy;;   BIOPSY   11/18/2020    Procedure: BIOPSY;  Surgeon: Elois Hair, MD;  Location: [facility] ENDOSCOPY;  Service: Gastroenterology;;   BIOPSY   09/13/2021    Procedure: BIOPSY;  Surgeon: Normie Becton., MD;  Location: [facility] ENDOSCOPY;  Service: Gastroenterology;;   CATARACT EXTRACTION W/ INTRAOCULAR LENS  IMPLANT, BILATERAL       COLON SURGERY       COLONOSCOPY N/A  08/04/2015    Procedure: COLONOSCOPY;  Surgeon: Nannette Babe, MD;  Location: [facility] ENDOSCOPY;  Service: Endoscopy;  Laterality: N/A;   COLONOSCOPY   2017    JMP@ [facility]-good prep-mass -recall 1 yr   COLONOSCOPY N/A 09/13/2021    Procedure: COLONOSCOPY;  Surgeon: Mansouraty, Albino Alu., MD;  Location: [facility] ENDOSCOPY;  Service: Gastroenterology;  Laterality: N/A;   COLONOSCOPY WITH PROPOFOL N/A 11/25/2020    Procedure: COLONOSCOPY WITH PROPOFOL;  Surgeon: Daina Drum, MD;  Location: [facility] ENDOSCOPY;  Service: Gastroenterology;  Laterality: N/A;   COLONOSCOPY WITH PROPOFOL N/A 09/23/2021    Procedure: COLONOSCOPY WITH PROPOFOL;  Surgeon: Annis Kinder, DO;  Location: [facility] ENDOSCOPY;  Service: Gastroenterology;  Laterality: N/A;   ENTEROSCOPY N/A 09/23/2021    Procedure: ENTEROSCOPY;  Surgeon: Annis Kinder, DO;  Location: [facility] ENDOSCOPY;  Service: Gastroenterology;  Laterality: N/A;  Will place order for video capsule study as we may opt to place it during procedure   ESOPHAGOGASTRODUODENOSCOPY N/A 08/01/2019    Procedure: ESOPHAGOGASTRODUODENOSCOPY (EGD);  Surgeon: Tami Falcon, MD;  Location: [facility] ENDOSCOPY;  Service: Endoscopy;  Laterality: N/A;   ESOPHAGOGASTRODUODENOSCOPY N/A 11/18/2020    Procedure: ESOPHAGOGASTRODUODENOSCOPY (EGD);  Surgeon: Elois Hair, MD;  Location: [facility] ENDOSCOPY;  Service: Gastroenterology;  Laterality: N/A;   ESOPHAGOGASTRODUODENOSCOPY (EGD) WITH PROPOFOL N/A 08/04/2019    Procedure: ESOPHAGOGASTRODUODENOSCOPY (EGD) WITH PROPOFOL;  Surgeon: Ace Holder, MD;  Location: [facility] ENDOSCOPY;  Service: Gastroenterology;  Laterality: N/A;   ESOPHAGOGASTRODUODENOSCOPY (EGD) WITH PROPOFOL N/A 09/13/2021    Procedure: ESOPHAGOGASTRODUODENOSCOPY (EGD) WITH PROPOFOL;  Surgeon: Brice Campi Albino Alu., MD;  Location: [facility] ENDOSCOPY;  Service: Gastroenterology;  Laterality: N/A;   GIVENS CAPSULE STUDY N/A 09/23/2021    Procedure: GIVENS CAPSULE STUDY;  Surgeon: Annis Kinder, DO;   Location: [facility] ENDOSCOPY;  Service: Gastroenterology;  Laterality: N/A;   graft left arm Left      for dialysis x 2. Removed   HOT HEMOSTASIS   11/18/2020    Procedure: HOT HEMOSTASIS (ARGON PLASMA COAGULATION/BICAP);  Surgeon: Elois Hair, MD;  Location: [facility] ENDOSCOPY;  Service: Gastroenterology;;   HOT HEMOSTASIS N/A 09/23/2021    Procedure: HOT HEMOSTASIS (ARGON PLASMA COAGULATION/BICAP);  Surgeon: Annis Kinder, DO;  Location: [facility] ENDOSCOPY;  Service: Gastroenterology;  Laterality: N/A;   INSERTION OF DIALYSIS CATHETER        Rt chest   LAPAROSCOPIC  RIGHT COLECTOMY N/A 08/05/2015    Procedure: LAPAROSCOPIC RIGHT COLECTOMY- ASCENDING;  Surgeon: Lockie Rima, MD;  Location: [facility] OR;  Service: General;  Laterality: N/A;   MASS EXCISION Left 05/28/2019    Procedure: EXCISION SOFT TISSUE MASS LEFT SHOULDER;  Surgeon: Oralee Billow, MD;  Location: [facility] ORS;  Service: General;  Laterality: Left;   NEPHRECTOMY Bilateral     PARATHYROIDECTOMY N/A 06/12/2016    Procedure: TOTAL PARATHYROIDECTOMY WITH AUTOTRANSPLANTATION TO LEFT FOREARM;  Surgeon: Oralee Billow, MD;  Location: [facility] OR;  Service: General;  Laterality: N/A;   PARATHYROIDECTOMY N/A 10/23/2016    Procedure: PARATHYROIDECTOMY;  Surgeon: Oralee Billow, MD;  Location: [facility] OR;  Service: General;  Laterality: N/A;   REVERSE SHOULDER ARTHROPLASTY Right 08/24/2020    Procedure: REVERSE SHOULDER ARTHROPLASTY;  Surgeon: Micheline Ahr, MD;  Location: [facility] OR;  Service: Orthopedics;  Laterality: Right;   REVISON OF ARTERIOVENOUS FISTULA Right 07/16/2017    Procedure: REVISION OF ARTERIOVENOUS FISTULA  Right ARM;  Surgeon: Adine Hoof, MD;  Location: [facility] OR;  Service: Vascular;  Laterality: Right;   TOTAL HIP ARTHROPLASTY Left 11/14/2020    Procedure: LEFT TOTAL HIP ARTHROPLASTY ANTERIOR APPROACH;  Surgeon: Wes Hamman, MD;  Location: [facility] OR;  Service: Orthopedics;  Laterality: Left;   UPPER GASTROINTESTINAL ENDOSCOPY   2021    @ [facility]              Family History  Problem Relation Age of Onset   Diabetes Father     Stroke Father     Hypertension Father     Uterine cancer Mother     Lupus Sister     Stroke Sister     Hypertension Sister     Anuerysm Brother          brain   Colon cancer Neg Hx     Esophageal cancer Neg Hx     Stomach cancer Neg Hx     Pancreatic cancer Neg Hx     Liver disease Neg Hx     Colon polyps Neg Hx     Rectal cancer Neg Hx          Social History:  reports that he quit smoking about 32 years ago. His smoking use included cigarettes. He has never used smokeless tobacco. He reports that he does not currently use alcohol after a past usage of about 1.0 standard drink of alcohol per week. He reports that he does not currently use drugs after having used the following drugs: Marijuana. Allergies:  Allergies       Allergies  Allergen Reactions   Infed [Iron Dextran] Other (See Comments)      Decreased BP    Vicodin [Hydrocodone-Acetaminophen] Other (See Comments)      Decreased BP   Diclofenac Other (See Comments)      Caused hypotension            Medications Prior to Admission  Medication Sig Dispense Refill   acetaminophen (TYLENOL) 650 MG CR tablet Take 1,300 mg by mouth every 8 (eight) hours as needed for pain.       acetaminophen-codeine (TYLENOL #3) 300-30 MG tablet Take 1 tablet by mouth every 6 (six) hours as needed for moderate pain (pain score 4-6). 30 tablet 0   atorvastatin (LIPITOR) 10 MG tablet Take 10 mg by mouth every Thursday.       augmented betamethasone dipropionate (DIPROLENE-AF) 0.05 % ointment Apply 1 Application topically 2 (two) times daily as needed for  rash.       cinacalcet (SENSIPAR) 60 MG tablet Take 120 mg by mouth daily with supper.       cromolyn (OPTICROM) 4 % ophthalmic solution Place 1 drop into both eyes daily as needed (redness).       Docusate Sodium (DSS) 100 MG CAPS Take 100 mg by mouth daily as needed (constipation.).       folic acid (FOLVITE)  1 MG tablet Take 1 tablet (1 mg total) by mouth daily. 30 tablet 0   LOKELMA 10 g PACK packet Take 1 packet by mouth daily.       multivitamin (RENA-VIT) TABS tablet Take 1 tablet by mouth daily.       pantoprazole (PROTONIX) 40 MG tablet Take 1 tablet (40 mg total) by mouth 2 (two) times daily before a meal. (Patient taking differently: Take 40 mg by mouth daily as needed (for acid reflux).) 60 tablet 1   sevelamer carbonate (RENVELA) 800 MG tablet Take 3,200-4,000 mg by mouth 3 (three) times daily with meals.       sucralfate (CARAFATE) 1 GM/10ML suspension Take 10 mLs (1 g total) by mouth 2 (two) times daily. (Patient taking differently: Take 1 g by mouth 2 (two) times daily as needed (indigestion/upset stomach.).) 420 mL 0   traMADol (ULTRAM) 50 MG tablet TAKE 1 TABLET BY MOUTH EVERY 12 HOURS AS NEEDED 30 tablet 0   vitamin E 180 MG (400 UNITS) capsule Take 400 Units by mouth 2 (two) times a week.       amoxicillin (AMOXIL) 500 MG capsule Take 4 pills one hour prior to dental work (Patient not taking: Reported on 05/06/2023) 12 capsule 3   gabapentin (NEURONTIN) 100 MG capsule Take 300 mg by mouth at bedtime. (Patient not taking: Reported on 05/25/2023)       oxyCODONE-acetaminophen (PERCOCET/ROXICET) 5-325 MG tablet Take 1-2 tablets by mouth every 6 (six) hours as needed. (Patient not taking: Reported on 05/06/2023)       vitamin B-12 (CYANOCOBALAMIN) 1000 MCG tablet Take 1,000 mcg by mouth 2 (two) times a week.              Home: Home Living Family/patient expects to be discharged to:: Skilled nursing facility Living Arrangements: Children Available Help at Discharge: Family Type of Home: Apartment Home Access: Stairs to enter Secretary/administrator of Steps: 18 Entrance Stairs-Rails: Right, Left Home Layout: One level Bathroom Shower/Tub: Engineer, manufacturing systems: Standard Home Equipment: Cane - single point, Hand held shower head, Other (comment), Toilet riser, Rollator (4  wheels), Rolling Walker (2 wheels), BSC/3in1 (fall mat in tub shower)  Lives With: Son  Functional History: Prior Function Prior Level of Function : Independent/Modified Independent, History of Falls (last six months), Driving Mobility Comments: uses cane for mobility, multiple falls on stairs in last 3 months. ADLs Comments: Indep with ADLs, IADLs. was previously driving to/from HD but due to RLE difficulties has reduced driving Functional Status:  Mobility: Bed Mobility Overal bed mobility: Needs Assistance Bed Mobility: Sit to Supine Rolling: Mod assist, +2 for safety/equipment Sidelying to sit: Mod assist, +2 for safety/equipment Sit to supine: Mod assist, +2 for safety/equipment General bed mobility comments: cues for log roll, but pt returning to supine without sidelying despite cues, needed assist to manage trunk and return LE to bed Transfers Overall transfer level: Needs assistance Equipment used: Ambulation equipment used Transfers: Sit to/from Stand, Bed to chair/wheelchair/BSC Sit to Stand: +2 physical assistance, Max assist Bed to/from chair/wheelchair/BSC transfer  type:: Via Theme park manager: [employer] transfer comment: icreased assist to power up from low recliner as pt unable to use arms due to shoulder ROM limitations, use of bed pad under hips to rise, then pt able to steady in standing wth UE support Ambulation/Gait Pre-gait activities: standing marches in stedy, limited lift on each leg, no toe clearance, no buckling   ADL: ADL Overall ADL's : Needs assistance/impaired Eating/Feeding: Maximal assistance, Bed level, Sitting Grooming: Maximal assistance, Sitting, Bed level, Moderate assistance Upper Body Bathing: Moderate assistance, Sitting Lower Body Bathing: Maximal assistance, +2 for physical assistance, +2 for safety/equipment, Sitting/lateral leans, Sit to/from stand Upper Body Dressing : Moderate assistance, Sitting Lower Body  Dressing: Total assistance, +2 for physical assistance, +2 for safety/equipment, Sit to/from stand, Sitting/lateral leans Toileting- Clothing Manipulation and Hygiene: +2 for safety/equipment, +2 for physical assistance, Total assistance, Sit to/from stand, Sitting/lateral lean General ADL Comments: Limited by impaired strength and coordination in BUE including ability to grasp items effectively.   Cognition: Cognition Orientation Level: (P) Oriented X4 Cognition Arousal: Alert Behavior During Therapy: WFL for tasks assessed/performed   Blood pressure (!) 142/85, pulse 86, temperature 97.9 F (36.6 C), temperature source Oral, resp. rate 12, height 6\' 1"  (1.854 m), weight 85.5 kg, SpO2 100%. Physical Exam Constitutional:      General: He is not in acute distress. HENT:     Head: Normocephalic.     Right Ear: External ear normal.     Left Ear: External ear normal.     Mouth/Throat:     Mouth: Mucous membranes are moist.  Eyes:     Pupils: Pupils are equal, round, and reactive to light.  Neck:     Comments: In cervical collar. Incision visible and CDI with steristrips Cardiovascular:     Rate and Rhythm: Normal rate.  Pulmonary:     Effort: Pulmonary effort is normal.  Abdominal:     Palpations: Abdomen is soft.  Musculoskeletal:        General: Tenderness (cervical) present. No swelling.  Skin:    General: Skin is warm.  Neurological:     Mental Status: He is alert.     Comments: Alert and oriented x 3. Normal insight and awareness. Intact Memory. Normal language and speech. Cranial nerve exam unremarkable.  MMT: Right 3+/5 deltoid, 3-4/5 bicep, 2+/5 tricep, 3+/5 wrist extension, 2/5 hand intrinsics.      1/5 hip flexor, 1/5 knee extension, 1/5 ankle dorsiflexion, 1/5 ankle plantarflexion. Left:  3-/5 deltoid, 3/5 bicep, 2-/5 tricep, 3-/5 wrist extension, 1/5 hand intrinsics.      3/5 hip flexor, 3/5 knee extension, 3/5 ankle dorsiflexion, 3/5 ankle plantarflexion. Decreased  sensation to LT in both hands. DTR's absent. No abnl resting tone.    Psychiatric:        Mood and Affect: Mood normal.        Behavior: Behavior normal.        Lab Results Last 24 Hours  No results found for this or any previous visit (from the past 24 hours).   Imaging Results (Last 48 hours)  No results found.   Assessment/Plan: Diagnosis: 63 yo male with C4 ASIA D SCI s/p C3-5 ACDF   Does the need for close, 24 hr/day medical supervision in concert with the patient's rehab needs make it unreasonable for this patient to be served in a less intensive setting? Yes Co-Morbidities requiring supervision/potential complications:  -Pain mgt -ESRD on HD -OSA on CPAP -Acute on  chronic anemia, hx of GIB Due to bowel management, safety, skin/wound care, disease management, medication administration, pain management, patient education, and egosupport , does the patient require 24 hr/day rehab nursing? Yes Does the patient require coordinated care of a physician, rehab nurse, therapy disciplines of PT, OT to address physical and functional deficits in the context of the above medical diagnosis(es)? Yes Addressing deficits in the following areas: balance, endurance, locomotion, strength, transferring, bowel/bladder control, bathing, dressing, feeding, grooming, toileting, and psychosocial support Can the patient actively participate in an intensive therapy program of at least 3 hrs of therapy per day at least 5 days per week? Yes The potential for patient to make measurable gains while on inpatient rehab is excellent Anticipated functional outcomes upon discharge from inpatient rehab are supervision and min assist  with PT, supervision, min assist, and mod assist with OT, n/a with SLP. Estimated rehab length of stay to reach the above functional goals is: 20-25 days Anticipated discharge destination: Home Overall Rehab/Functional Prognosis: excellent   POST ACUTE RECOMMENDATIONS: This  patient's condition is appropriate for continued rehabilitative care in the following setting: CIR Patient has agreed to participate in recommended program. Yes Note that insurance prior authorization may be required for reimbursement for recommended care.   Comment: Pt is motivated and has good social supports. Family is looking for a first floor apt     MEDICAL RECOMMENDATIONS: Recommend screening LE venous dopplers given that he can't be on anticoagulants.      I have personally performed a face to face diagnostic evaluation of this patient. Additionally, I have examined the patient's medical record including any pertinent labs and radiographic images.     Thanks,   Rawland Caddy, MD 05/28/2023          Routing History

## 2023-05-30 NOTE — TOC Transition Note (Signed)
 Transition of Care (TOC) - Discharge Note Sherin Dingwall RN,BSN Transitions of Care Unit 4NP (Non Trauma)- RN Case Manager See Treatment Team for direct Phone #   Patient Details  Name: Carl Antuna Sr. MRN: [id number] Date of Birth: August 28, 1960  Transition of Care [facility]) CM/SW Contact:  Rox Cope, RN Phone Number: 05/30/2023, 12:03 PM   Clinical Narrative:    Notified by CIR liaison that pt has insurance auth and bed available to admit to [facility] INPT rehab today  Pt stable for transition to INPT rehab.   No further TOC needs at this time- pt will transition later this afternoon to [facility] INPT rehab.    Final next level of care: IP Rehab Facility Barriers to Discharge: No Barriers Identified   Patient Goals and CMS Choice Patient states their goals for this hospitalization and ongoing recovery are:: rehab then return home   Choice offered to / list presented to : Patient      Discharge Placement               [facility] INPT rehab        Discharge Plan and Services Additional resources added to the After Visit Summary for       Post Acute Care Choice: IP Rehab                               Social Drivers of Health (SDOH) Interventions SDOH Screenings   Food Insecurity: No Food Insecurity (05/26/2023)  Housing: Low Risk  (05/26/2023)  Transportation Needs: No Transportation Needs (05/26/2023)  Utilities: Not At Risk (05/26/2023)  Social Connections: Unknown (06/26/2021)   Received from [facility]  Tobacco Use: Medium Risk (05/26/2023)     Readmission Risk Interventions    05/30/2023   12:03 PM  Readmission Risk Prevention Plan  Transportation Screening Complete  HRI or Home Care Consult Complete  Social Work Consult for Recovery Care Planning/Counseling Complete  Palliative Care Screening Not Applicable  Medication Review Oceanographer) Complete

## 2023-05-30 NOTE — Progress Notes (Signed)
 Contacted [facility] to be advised that pt will d/c to inpt rehab today and navigator will f/u with clinic regarding d/c date from rehab once known. Will assist as needed.   Lauraine Polite Renal Navigator [phone]

## 2023-05-30 NOTE — Discharge Summary (Signed)
 Physician Discharge Summary  Carl Magri Sr. ONG:[id number] DOB: Dec 28, 1960 DOA: 05/25/2023  PCP: Lanae Pinal, MD  Admit date: 05/25/2023 Discharge date: 05/30/2023  Admitted From: Home Disposition:  Inpt rehab  Recommendations for Outpatient Follow-up:  Follow up with PCP in 1-2 weeks Follow up with neurosurgery as scheduled  Discharge Condition:Stable  CODE STATUS:Full  Diet recommendation: Renal nights    Brief/Interim Summary: Carl Samford Sr. is a 63 y.o. 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    Patient found to have C4 fracture - repaired 4/13 with Dr. Gwendlyn Lemmings.  Tolerated procedure quite well.  Continued PT OT evaluation as clinically improves he is now stable for discharge to inpatient rehab.  Discharge Diagnoses:  Principal Problem:   C4 cervical fracture (HCC) Active Problems:   Cervical vertebral fracture (HCC)   GERD (gastroesophageal reflux disease)   History of CVA (cerebrovascular accident)   Anemia of chronic disease   Essential hypertension   Seizure (HCC)   ESRD (end stage renal disease) (HCC)   Obstructive sleep apnea   History of GI bleed  C4 fracture with incomplete spinal cord injury status post corpectomy 4/13 -Tolerated surgery quite well, wean narcotics as appropriate Resuming home Neurontin 300 at bedtime  -PT OT continue to follow, plan for discharge to inpatient rehab pending authorization   Prior lacunar infarct in the past -Not on antiplatelet secondary to risk of bleeding with GI bleed previously   Recurrent prior GI bleed -Continue Protonix 40 twice daily, can use Zofran-no antiplatelets -Sucralfate not used in ESRD--not  taking anyway at home   ESRD MWF -Nephrology following, appreciate insight recommendations -Continue Sensipar 120, Renvela variable dosing 3 times daily meals -Occasional episodes of hypotension with HD -not currently requiring midodrine   OSA on CPAP -Resume CPAP machine with home settings  Discharge Instructions  Discharge Instructions     Call MD for:  difficulty breathing, headache or visual disturbances   Complete by: As directed    Call MD for:  extreme fatigue   Complete by: As directed    Call MD for:  hives   Complete by: As directed    Call MD for:  persistant dizziness or light-headedness   Complete by: As directed    Call MD for:  persistant nausea and vomiting   Complete by: As directed    Call MD for:  severe uncontrolled pain   Complete by: As directed    Call MD for:  temperature >100.4   Complete by: As directed    Diet - low sodium heart healthy   Complete by: As directed    Increase activity slowly   Complete by: As directed       Allergies as of 05/30/2023       Reactions   Infed [iron Dextran] Other (See Comments)   Decreased BP    Vicodin [hydrocodone-acetaminophen] Other (See Comments)   Decreased BP   Diclofenac Other (See Comments)   Caused hypotension        Medication List     STOP taking these medications    acetaminophen-codeine 300-30 MG tablet Commonly known as: TYLENOL #3   amoxicillin 500 MG capsule Commonly known as: AMOXIL       TAKE these medications    acetaminophen  650 MG CR tablet Commonly known as: TYLENOL Take 1,300 mg by mouth every 8 (eight) hours as needed for pain.   atorvastatin 10 MG tablet Commonly known as: LIPITOR Take 1 tablet (10 mg total) by mouth every Thursday.   augmented betamethasone dipropionate 0.05 % ointment Commonly known as: DIPROLENE-AF Apply 1 Application topically 2 (two) times daily as needed for rash.   cinacalcet 60 MG tablet Commonly known as: SENSIPAR Take 120 mg by  mouth daily with supper.   cromolyn 4 % ophthalmic solution Commonly known as: OPTICROM Place 1 drop into both eyes daily as needed (redness).   cyanocobalamin 1000 MCG tablet Commonly known as: VITAMIN B12 Take 1,000 mcg by mouth 2 (two) times a week.   cyclobenzaprine 10 MG tablet Commonly known as: FLEXERIL Take 1 tablet (10 mg total) by mouth 3 (three) times daily as needed for muscle spasms.   DSS 100 MG Caps Take 100 mg by mouth daily as needed (constipation.).   folic acid 1 MG tablet Commonly known as: FOLVITE Take 1 tablet (1 mg total) by mouth daily.   gabapentin 100 MG capsule Commonly known as: NEURONTIN Take 3 capsules (300 mg total) by mouth at bedtime.   Lokelma 10 g Pack packet Generic drug: sodium zirconium cyclosilicate Take 1 packet by mouth daily.   multivitamin Tabs tablet Take 1 tablet by mouth daily.   oxyCODONE-acetaminophen 5-325 MG tablet Commonly known as: PERCOCET/ROXICET Take 1-2 tablets by mouth every 4 (four) hours as needed for moderate pain (pain score 4-6). What changed:  when to take this reasons to take this   pantoprazole 40 MG tablet Commonly known as: PROTONIX Take 1 tablet (40 mg total) by mouth 2 (two) times daily before a meal. What changed:  when to take this reasons to take this   polyethylene glycol 17 g packet Commonly known as: MIRALAX / GLYCOLAX Take 17 g by mouth 2 (two) times daily.   senna-docusate 8.6-50 MG tablet Commonly known as: Senokot-S Take 2 tablets by mouth 2 (two) times daily.   sevelamer carbonate 800 MG tablet Commonly known as: RENVELA Take 3,200-4,000 mg by mouth 3 (three) times daily with meals.   sucralfate 1 GM/10ML suspension Commonly known as: CARAFATE Take 10 mLs (1 g total) by mouth 2 (two) times daily. What changed:  when to take this reasons to take this   traMADol 50 MG tablet Commonly known as: ULTRAM TAKE 1 TABLET BY MOUTH EVERY 12 HOURS AS NEEDED   vitamin E 180 MG (400  UNITS) capsule Take 400 Units by mouth 2 (two) times a week.        Follow-up Information     Schedule an appointment as soon as possible for a visit  with [facility].   Specialty: Orthopedics Contact information: [address] [place] [address] [phone]               Allergies  Allergen Reactions   Runell Gess Maralyn Sago Dextran] Other (See Comments)    Decreased BP    Vicodin [Hydrocodone-Acetaminophen] Other (See Comments)    Decreased BP   Diclofenac Other (See Comments)    Caused hypotension    Consultations: Neurosurgery, PCCM   Procedures/Studies: DG PAIN CLINIC C-ARM 1-60 MIN NO REPORT Result Date: 05/26/2023 Fluoro was used, but no Radiologist interpretation will be provided. Please refer to "NOTES" tab for provider progress note.  DG PAIN CLINIC C-ARM 1-60 MIN NO REPORT Result Date: 05/26/2023 Fluoro was used, but  no Radiologist interpretation will be provided. Please refer to "NOTES" tab for provider progress note.  DG Cervical Spine 2 or 3 views Result Date: 05/26/2023 CLINICAL DATA:  Anterior cervical corpectomy at C4 with C3-5 fusion. EXAM: CERVICAL SPINE - 2-3 VIEW COMPARISON:  CT imaging from 1 day prior FINDINGS: 2 intraoperative cross-table lateral fluoro images demonstrate loss of vertebral body height at C4, similar to yesterday's CT scan. Interbody graft material noted C3-4 and C4-5. Anterior plate extends from C3 to C5 although inferior aspect of the plate in the inferior-most fixation screws are obscured by the patient's shoulders. Endotracheal tube and esophageal temperature probe evident. IMPRESSION: Intraoperative fluoroscopy during C3-5 discectomy infusion with anterior plate from B1-Y7. No evidence for immediate complicating features. Electronically Signed   By: Donnal Fusi M.D.   On: 05/26/2023 05:30   MR CERVICAL SPINE WO CONTRAST Result Date: 05/25/2023 CLINICAL DATA:  Initial evaluation for acute  myelopathy. EXAM: MRI CERVICAL SPINE WITHOUT CONTRAST TECHNIQUE: Multiplanar, multisequence MR imaging of the cervical spine was performed. No intravenous contrast was administered. COMPARISON:  Prior CT from earlier the same day. FINDINGS: Alignment: Reversal of the normal cervical lordosis. No significant listhesis. Vertebrae: Evolving fracture involving the C4 vertebral body again seen, corresponding with abnormality on prior CT. Associated height loss measures up to 50% with 4 mm bony retropulsion. Extension through the posterior elements on the right. Fracture is pathologic in appearance, and suspected to be related underlying renal osteodystrophy as question on prior CT. Associated prominent reactive endplate changes about the C4-5 and C5-6 interspaces. Vertebral body height otherwise maintained. Decreased T1 signal intensity seen throughout the visualized bone marrow, suspected to be related history of end-stage renal disease. No worrisome osseous lesions. No other abnormal marrow edema. Cord: Patchy signal abnormality seen involving the cervical cord extending from C3 through C6, consistent with edema and/or evolving myelomalacia (series 10, image 9). Finding is suspected to be posttraumatic in nature related to the adjacent fracture. Posterior Fossa, vertebral arteries, paraspinal tissues: Large left mastoid effusion. Few retention cysts noted at the nasopharynx. Craniocervical junction normal. Mild diffuse prevertebral edema related to the acute C4 fracture. Suspected injury of the longitudinal ligament at the level of C4. Remainder of the major ligamentous structures appear grossly intact. Normal flow voids seen within the vertebral arteries bilaterally. Disc levels: C2-C3: Disc desiccation with mild disc bulge and uncovertebral spurring. Mild facet hypertrophy. Mild spinal stenosis. Foramina remain patent. C3-C4: Central to right paracentral disc osteophyte complex mildly flattens the ventral thecal sac.  Superimposed right greater left facet hypertrophy with right-sided uncovertebral spurring. Moderate spinal stenosis. Moderate right C4 foraminal narrowing. Left neural foramina remains patent. C4-C5: Advance intervertebral disc space narrowing. 4 mm bony retropulsion related to the pathologic C4 fracture. Flattening and effacement of the ventral thecal sac. Secondary cord flattening with associated cord signal changes as above. Superimposed right-sided facet hypertrophy. Resultant severe spinal stenosis with the thecal sac measuring 5 mm in AP diameter at its most narrow point. Moderate bilateral C5 foraminal narrowing. C5-C6: Advanced intervertebral disc space narrowing with diffuse disc osteophyte complex. Flattening of the ventral thecal sac. Superimposed mild facet hypertrophy. Mild spinal stenosis. Moderate left C6 foraminal narrowing. Right neural foramen remains patent. C6-C7: Degenerate intervertebral disc space narrowing with diffuse disc bulge and uncovertebral spurring. Mild facet hypertrophy. No significant spinal stenosis. Foramina remain patent. C7-T1: Disc desiccation without significant disc bulge. Mild facet hypertrophy. No significant stenosis. IMPRESSION: 1. Evolving C4 fracture with 50% height loss and 4 mm bony  retropulsion, subacute in appearance, and likely pathologic due to underlying renal osteodystrophy as postulated on prior CT. Underlying infection could conceivably have this appearance as well, and could be considered in the correct clinical setting. 2. Resultant severe spinal stenosis at the level of C4. Patchy signal abnormality involving the cervical cord extending from C3 through C6, consistent with edema and/or evolving myelomalacia. 3. Multifactorial degenerative changes at C3-4 and C5-6 with resultant moderate spinal stenosis at both levels. Moderate right C4, bilateral C5, left C6 foraminal narrowing. Results were discussed by telephone at the time of image acquisition on  05/25/2023 at 8:40 p.m. to provider [facility] , who verbally acknowledged these results. Electronically Signed   By: Rise Mu M.D.   On: 05/25/2023 21:22   CT CERVICAL SPINE WO CONTRAST Result Date: 05/25/2023 CLINICAL DATA:  Initial evaluation for acute trauma, fall. EXAM: CT CERVICAL SPINE WITHOUT CONTRAST TECHNIQUE: Multidetector CT imaging of the cervical spine was performed without intravenous contrast. Multiplanar CT image reconstructions were also generated. RADIATION DOSE REDUCTION: This exam was performed according to the departmental dose-optimization program which includes automated exposure control, adjustment of the mA and/or kV according to patient size and/or use of iterative reconstruction technique. COMPARISON:  Prior MRI from 06/27/2006. FINDINGS: Alignment: Reversal of the normal cervical lordosis. Underlying cervicothoracic levoscoliosis. No listhesis. Skull base and vertebrae: Bones are somewhat diffusely sclerotic in appearance, suggesting a degree of underlying renal osteodystrophy. Skull base intact. Normal C1-2 articulations are preserved. Dens is intact. There is an evolving fracture involving the C4 vertebral body. Associated height loss measures up to 50% with 4 mm bony retropulsion. Fracture is pathologic in subacute in appearance, suspected to be related underlying renal osteodystrophy. Extension through the right posterior elements and C4 facet (series 7, image 37). Vertebral body height otherwise maintained. No worrisome osseous lesions. Soft tissues and spinal canal: Mild diffuse prevertebral edema. Vascular calcifications noted about the carotid bifurcations. Postoperative changes noted about the thyroid. Disc levels:  Advanced degenerative spondylosis at C4-5 and C5-6. Upper chest: Visualized upper chest demonstrates no acute finding. Other: None. IMPRESSION: 1. Evolving subacute fracture involving the C4 vertebral body with up to 50% height loss and 4 mm bony  retropulsion. Extension through the right posterior elements/articular facet of C4. 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 2. Underlying advanced degenerative spondylosis at C4-5 and C5-6. Critical Value/emergent results were discussed by telephone at the time of image acquisition on 05/25/2023 at approximately 7:20 p.m. to provider Dr. Derry Lory , who verbally acknowledged these results. Electronically Signed   By: Rise Mu M.D.   On: 05/25/2023 21:01   MR BRAIN WO CONTRAST Result Date: 05/25/2023 CLINICAL DATA:  Initial evaluation for acute neuro deficit, stroke suspected. EXAM: MRI HEAD WITHOUT CONTRAST TECHNIQUE: Multiplanar, multiecho pulse sequences of the brain and surrounding structures were obtained without intravenous contrast. COMPARISON:  CT from earlier the same day. FINDINGS: Brain: Cerebral volume within normal limits. Patchy T2/FLAIR hyperintensity involving the periventricular deep white matter both cerebral hemispheres, consistent chronic small vessel ischemic disease, mild in nature. Remote lacunar infarct present at the anterior left basal ganglia. Encephalomalacia and gliosis involving the left occipital lobe, consistent with a chronic left PCA distribution infarct. This corresponds with abnormality on prior CT. Mild chronic hemosiderin staining at this location. No evidence for acute or subacute ischemia. Gray-white matter differentiation maintained. No other areas of chronic cortical infarction. No other acute or chronic intracranial blood products. No mass lesion, midline  shift or mass effect. No hydrocephalus or extra-axial fluid collection. Pituitary gland within normal limits. Vascular: Major intracranial vascular flow voids are well maintained. Skull and upper cervical spine: Craniocervical junction within normal limits. Decreased T1 signal intensity noted within the visualized bone marrow, nonspecific, but most  commonly related to anemia, smoking or obesity. Probable evolving C4 fracture, better seen on corresponding MRI of the cervical spine. Soft tissue contusion at the left frontal scalp. Sinuses/Orbits: Prior bilateral ocular lens replacement. Paranasal sinuses are largely clear. Large left mastoid and middle ear effusion, with some fluid signal intensity within the left petrous apex. Few small retention cysts noted at the nasopharynx. Other: None. IMPRESSION: 1. No acute intracranial abnormality. 2. Small soft tissue contusion at the left frontal scalp. 3. Chronic left PCA distribution infarct. 4. Underlying mild chronic microvascular ischemic disease with remote lacunar infarct at the anterior left basal ganglia. 5. Probable evolving C4 fracture, better seen on corresponding MRI of the cervical spine. 6. Large left mastoid and middle ear effusion. Several small retention cyst present at the nasopharynx, suggesting that this finding is postobstructive in nature. Electronically Signed   By: Rise Mu M.D.   On: 05/25/2023 20:51   CT HEAD CODE STROKE WO CONTRAST Result Date: 05/25/2023 CLINICAL DATA:  Code stroke. Initial evaluation for acute neuro deficit, stroke suspected. EXAM: CT HEAD WITHOUT CONTRAST TECHNIQUE: Contiguous axial images were obtained from the base of the skull through the vertex without intravenous contrast. RADIATION DOSE REDUCTION: This exam was performed according to the departmental dose-optimization program which includes automated exposure control, adjustment of the mA and/or kV according to patient size and/or use of iterative reconstruction technique. COMPARISON:  CT from 09/11/2021 FINDINGS: Brain: Extensive dural calcifications again noted. Focal hypodensity involving the left occipital lobe, consistent with a small acute left PCA distribution infarct (image 17). No convincing acute intracranial hemorrhage. Note made of a small apparent focal hyperdensity at the high left  frontal convexity, seen on axial slice (series 2, image 24), not confidently seen on corresponding sequences, and favored to be artifactual. No other acute large vessel territory infarct. No mass lesion or midline shift. No hydrocephalus. No extra-axial fluid collection. Vascular: No abnormal hyperdense vessel. Scattered calcified atherosclerosis present at the skull base. Skull: Soft tissue contusion present at the left frontal scalp near the vertex. Calvarium intact. Sinuses/Orbits: Globes orbital soft tissues within normal limits. Paranasal sinuses are largely clear. Large left mastoid and middle ear effusion. Mild asymmetric soft tissue fullness at the left nasopharynx without discrete mass (series 2, image 6). Other: None. ASPECTS [facility] Stroke Program Early CT Score) - Ganglionic level infarction (caudate, lentiform nuclei, internal capsule, insula, M1-M3 cortex): 7 - Supraganglionic infarction (M4-M6 cortex): 3 Total score (0-10 with 10 being normal): 10 IMPRESSION: 1. Small acute left PCA distribution infarct. No acute intracranial hemorrhage. 2. Aspects equals 10. 3. Soft tissue contusion at the left frontal scalp. No calvarial fracture. No acute intracranial hemorrhage. 4. Underlying atrophy with chronic small vessel ischemic disease. Chronic lacunar infarct at the left basal ganglia. Case discussed by telephone at the time of interpretation on 05/25/2023 at 7:18 p.m. To provider Dr. Derry Lory. Electronically Signed   By: Rise Mu M.D.   On: 05/25/2023 19:35    Subjective: No acute issues/events overnight   Discharge Exam: Vitals:   05/30/23 0230 05/30/23 0447  BP: (!) 150/92 (!) 146/86  Pulse: 100 98  Resp:  19  Temp: (!) 97.5 F (36.4 C) 97.6 F (36.4 C)  SpO2: 100% 100%   Vitals:   05/30/23 0137 05/30/23 0145 05/30/23 0230 05/30/23 0447  BP: 122/82 (!) 148/89 (!) 150/92 (!) 146/86  Pulse: 96 96 100 98  Resp: 11 14  19   Temp:  97.6 F (36.4 C) (!) 97.5 F (36.4 C)  97.6 F (36.4 C)  TempSrc:   Oral Oral  SpO2: 100% 100% 100% 100%  Weight:  86.5 kg    Height:        General:  Pleasantly resting in bed, No acute distress. HEENT:  Normocephalic atraumatic.  Sclerae nonicteric, noninjected.  Extraocular movements intact bilaterally. Neck:  Without mass or deformity.  Trachea is midline. Lungs:  Clear to auscultate bilaterally without rhonchi, wheeze, or rales. Heart:  Regular rate and rhythm.  Without murmurs, rubs, or gallops. Abdomen:  Soft, nontender, nondistended.  Without guarding or rebound. Extremities: Decreased range of motion and diminished strength bilaterally in both upper and lower extremities 3/5 globally    The results of significant diagnostics from this hospitalization (including imaging, microbiology, ancillary and laboratory) are listed below for reference.     Microbiology: Recent Results (from the past 240 hours)  MRSA Next Gen by PCR, Nasal     Status: None   Collection Time: 05/29/23  4:40 PM   Specimen: Nasal Mucosa; Nasal Swab  Result Value Ref Range Status   MRSA by PCR Next Gen NOT DETECTED NOT DETECTED Final    Comment: (NOTE) The GeneXpert MRSA Assay (FDA approved for NASAL specimens only), is one component of a comprehensive MRSA colonization surveillance program. It is not intended to diagnose MRSA infection nor to guide or monitor treatment for MRSA infections. Test performance is not FDA approved in patients less than 42 years old. Performed at [facility], [address] [place], [place] [address]      Labs: Basic Metabolic Panel: Recent Labs  Lab 05/25/23 1830 05/25/23 1933 05/27/23 0921 05/29/23 2150  NA 140 140 136 130*  K 3.8 3.8 4.5 5.5*  CL 96* 97* 94* 92*  CO2 32  --  27 24  GLUCOSE 107* 89 95 108*  BUN 35* 36* 54* 47*  CREATININE 7.18* 7.60* 9.65* 9.36*  CALCIUM 9.8  --  9.1 8.8*  PHOS  --   --   --  6.2*   Liver Function Tests: Recent Labs  Lab 05/25/23 1830  05/27/23 0921 05/29/23 2150  AST 21 15  --   ALT 13 6  --   ALKPHOS 70 71  --   BILITOT 0.6 0.5  --   PROT 7.4 6.9  --   ALBUMIN 3.2* 2.8* 2.9*    CBC: Recent Labs  Lab 05/25/23 1830 05/25/23 1933 05/27/23 0921 05/29/23 2155  WBC 6.4  --  7.3 8.2  NEUTROABS 4.4  --   --   --   HGB 9.9* 10.5* 8.4* 9.3*  HCT 31.8* 31.0* 26.1* 29.5*  MCV 92.2  --  90.3 90.2  PLT 203  --  199 266   CBG: Recent Labs  Lab 05/25/23 1904 05/26/23 0344  GLUCAP 81 90   Urinalysis    Component Value Date/Time   COLORURINE YELLOW 08/06/2006 0806   APPEARANCEUR CLEAR 08/06/2006 0806   LABSPEC 1.013 08/06/2006 0806   PHURINE 8.0 08/06/2006 0806   GLUCOSEU 100 (A) 08/06/2006 0806   HGBUR MODERATE (A) 08/06/2006 0806   BILIRUBINUR NEGATIVE 08/06/2006 0806   KETONESUR NEGATIVE 08/06/2006 0806   PROTEINUR > 300 08/06/2006 0806  UROBILINOGEN 0.2 08/06/2006 0806   NITRITE NEGATIVE 08/06/2006 0806   LEUKOCYTESUR SMALL 08/06/2006 0806   Sepsis Labs Recent Labs  Lab 05/25/23 1830 05/27/23 0921 05/29/23 2155  WBC 6.4 7.3 8.2   Microbiology Recent Results (from the past 240 hours)  MRSA Next Gen by PCR, Nasal     Status: None   Collection Time: 05/29/23  4:40 PM   Specimen: Nasal Mucosa; Nasal Swab  Result Value Ref Range Status   MRSA by PCR Next Gen NOT DETECTED NOT DETECTED Final    Comment: (NOTE) The GeneXpert MRSA Assay (FDA approved for NASAL specimens only), is one component of a comprehensive MRSA colonization surveillance program. It is not intended to diagnose MRSA infection nor to guide or monitor treatment for MRSA infections. Test performance is not FDA approved in patients less than 1 years old. Performed at [facility], [address], [place], [place] [address]      Time coordinating discharge: Over 30 minutes  SIGNED:   Haydee Lipa, DO [facility] 05/30/2023, 8:05 AM Pager   If 7PM-7AM, please contact  night-coverage [email or web address]

## 2023-05-30 NOTE — Progress Notes (Signed)
 Pt discharged to CIR unit 4 west. Pt transported off unit via bed with belongings including pt's cell phone and charge. P. Amo Naijah Lacek RN

## 2023-05-31 DIAGNOSIS — Z992 Dependence on renal dialysis: Secondary | ICD-10-CM

## 2023-05-31 DIAGNOSIS — I951 Orthostatic hypotension: Secondary | ICD-10-CM | POA: Diagnosis not present

## 2023-05-31 DIAGNOSIS — S14109S Unspecified injury at unspecified level of cervical spinal cord, sequela: Secondary | ICD-10-CM | POA: Diagnosis not present

## 2023-05-31 DIAGNOSIS — I10 Essential (primary) hypertension: Secondary | ICD-10-CM | POA: Diagnosis not present

## 2023-05-31 DIAGNOSIS — N186 End stage renal disease: Secondary | ICD-10-CM

## 2023-05-31 DIAGNOSIS — S14109D Unspecified injury at unspecified level of cervical spinal cord, subsequent encounter: Secondary | ICD-10-CM

## 2023-05-31 MED ORDER — PROSOURCE PLUS PO LIQD
30.0000 mL | Freq: Two times a day (BID) | ORAL | Status: DC
  Administered 2023-05-31 – 2023-07-14 (×71): 30 mL via ORAL
  Filled 2023-05-31 (×68): qty 30

## 2023-05-31 MED ORDER — CHLORHEXIDINE GLUCONATE CLOTH 2 % EX PADS
6.0000 | MEDICATED_PAD | Freq: Every day | CUTANEOUS | Status: DC
  Administered 2023-06-01 – 2023-06-08 (×6): 6 via TOPICAL

## 2023-05-31 MED ORDER — POLYETHYLENE GLYCOL 3350 17 G PO PACK
17.0000 g | PACK | Freq: Three times a day (TID) | ORAL | Status: DC
  Administered 2023-05-31 – 2023-06-04 (×9): 17 g via ORAL
  Filled 2023-05-31 (×24): qty 1

## 2023-05-31 MED ORDER — TOPIRAMATE 25 MG PO TABS
25.0000 mg | ORAL_TABLET | Freq: Two times a day (BID) | ORAL | Status: DC
  Administered 2023-05-31 – 2023-07-14 (×89): 25 mg via ORAL
  Filled 2023-05-31 (×92): qty 1

## 2023-05-31 NOTE — Evaluation (Signed)
 Occupational Therapy Assessment and Plan  Patient Details  Name: Carl Mortensen Sr. MRN: [id number] Date of Birth: 1960-10-30  OT Diagnosis: abnormal posture, acute pain, ataxia, muscle weakness (generalized), quadriparesis at level C3-C5, and decreased activity tolerance Rehab Potential: Rehab Potential (ACUTE ONLY): Fair ELOS: ~4 weeks   Today's Date: 05/31/2023 OT Individual Time: 2952-8413 OT Individual Time Calculation (min): 57 min     Today's Date: 05/31/2023 OT Individual Time: 1105-1200 OT Individual Time Calculation (min): 55 min    Hospital Problem: Principal Problem:   Spinal cord injury, cervical region, sequela (HCC)   Past Medical History:  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    Parathyroid  abnormality (HCC)    ectopic parathyroid  gland   Presence of arteriovenous fistula for hemodialysis, primary (HCC)    RUE PER PT RLE   Refusal of blood product    NO WHOLE BLOOD PROUCTS   Renal cell carcinoma (HCC)    s/p hand assisted laparoscopic bilateral nephrectomies 11/29/17, + RCC left   Secondary hyperparathyroidism (HCC)    Seizures (HCC)    one episode in past, due to" elevated Potassium" 08/02/20- "at least 4 years ago"   Sleep apnea    doesn't use CPAP anymore since weight loss   Stroke [facility])    no residual   Past Surgical History:  Past Surgical History:  Procedure Laterality Date   ANTERIOR CERVICAL CORPECTOMY N/A 05/25/2023    Procedure: ANTERIOR CERVICAL CORPECTOMY CERVICAL FOUR, CERVICAL THREE - CERVICAL FIVE FUSION;  Surgeon: [name], MD;  Location: [facility] OR;  Service: Neurosurgery;  Laterality: N/A;   AV FISTULA PLACEMENT Right    right arm   BIOPSY  08/01/2019   Procedure: BIOPSY;  Surgeon: Tami Falcon, MD;  Location: [facility] ENDOSCOPY;  Service: Endoscopy;;   BIOPSY  11/18/2020   Procedure: BIOPSY;  Surgeon: Elois Hair, MD;  Location: [facility] ENDOSCOPY;  Service: Gastroenterology;;   BIOPSY  09/13/2021   Procedure: BIOPSY;  Surgeon: Normie Becton., MD;  Location: [facility] ENDOSCOPY;  Service: Gastroenterology;;   CATARACT EXTRACTION W/ INTRAOCULAR LENS  IMPLANT, BILATERAL     COLON SURGERY     COLONOSCOPY N/A 08/04/2015   Procedure: COLONOSCOPY;  Surgeon: Nannette Babe, MD;  Location: [facility] ENDOSCOPY;  Service: Endoscopy;  Laterality: N/A;   COLONOSCOPY  2017   JMP@ [facility]-good prep-mass -recall 1 yr   COLONOSCOPY N/A 09/13/2021   Procedure: COLONOSCOPY;  Surgeon: Mansouraty, Albino Alu., MD;  Location: [facility] ENDOSCOPY;  Service: Gastroenterology;  Laterality: N/A;   COLONOSCOPY WITH PROPOFOL  N/A 11/25/2020   Procedure: COLONOSCOPY WITH PROPOFOL ;  Surgeon: Daina Drum, MD;  Location: [facility] ENDOSCOPY;  Service: Gastroenterology;  Laterality: N/A;   COLONOSCOPY WITH PROPOFOL  N/A 09/23/2021   Procedure: COLONOSCOPY WITH PROPOFOL ;  Surgeon: Annis Kinder, DO;  Location: [facility] ENDOSCOPY;  Service: Gastroenterology;  Laterality: N/A;   ENTEROSCOPY N/A 09/23/2021   Procedure: ENTEROSCOPY;  Surgeon: Annis Kinder, DO;  Location: [facility] ENDOSCOPY;  Service: Gastroenterology;  Laterality: N/A;  Will place order for video capsule study as we may opt to place it during procedure   ESOPHAGOGASTRODUODENOSCOPY N/A 08/01/2019   Procedure: ESOPHAGOGASTRODUODENOSCOPY (EGD);  Surgeon: Tami Falcon, MD;  Location: [facility] ENDOSCOPY;  Service: Endoscopy;  Laterality: N/A;   ESOPHAGOGASTRODUODENOSCOPY N/A 11/18/2020   Procedure:  ESOPHAGOGASTRODUODENOSCOPY (EGD);  Surgeon: Elois Hair, MD;  Location: [facility] ENDOSCOPY;  Service: Gastroenterology;  Laterality: N/A;   ESOPHAGOGASTRODUODENOSCOPY (EGD) WITH PROPOFOL  N/A 08/04/2019   Procedure: ESOPHAGOGASTRODUODENOSCOPY (EGD) WITH PROPOFOL ;  Surgeon: Ace Holder, MD;  Location: [facility] ENDOSCOPY;  Service: Gastroenterology;  Laterality: N/A;   ESOPHAGOGASTRODUODENOSCOPY (EGD) WITH PROPOFOL  N/A 09/13/2021   Procedure: ESOPHAGOGASTRODUODENOSCOPY (EGD) WITH PROPOFOL ;  Surgeon: Brice Campi Albino Alu., MD;  Location: [facility] ENDOSCOPY;  Service: Gastroenterology;  Laterality: N/A;   GIVENS CAPSULE STUDY N/A 09/23/2021   Procedure: GIVENS CAPSULE STUDY;  Surgeon: Annis Kinder, DO;  Location: [facility] ENDOSCOPY;  Service: Gastroenterology;  Laterality: N/A;   graft left arm Left    for dialysis x 2. Removed   HOT HEMOSTASIS  11/18/2020   Procedure: HOT HEMOSTASIS (ARGON PLASMA COAGULATION/BICAP);  Surgeon: Elois Hair, MD;  Location: [facility] ENDOSCOPY;  Service: Gastroenterology;;   HOT HEMOSTASIS N/A 09/23/2021   Procedure: HOT HEMOSTASIS (ARGON PLASMA COAGULATION/BICAP);  Surgeon: Annis Kinder, DO;  Location: [facility] ENDOSCOPY;  Service: Gastroenterology;  Laterality: N/A;   INSERTION OF DIALYSIS CATHETER     Rt chest   LAPAROSCOPIC RIGHT COLECTOMY N/A 08/05/2015   Procedure: LAPAROSCOPIC RIGHT COLECTOMY- ASCENDING;  Surgeon: Lockie Rima, MD;  Location: [facility] OR;  Service: General;  Laterality: N/A;   MASS EXCISION Left 05/28/2019   Procedure: EXCISION SOFT TISSUE MASS LEFT SHOULDER;  Surgeon: Oralee Billow, MD;  Location: [facility] ORS;  Service: General;  Laterality: Left;   NEPHRECTOMY Bilateral    PARATHYROIDECTOMY N/A 06/12/2016   Procedure: TOTAL PARATHYROIDECTOMY WITH AUTOTRANSPLANTATION TO LEFT FOREARM;  Surgeon: Oralee Billow, MD;  Location[facility] OR;  Service: General;  Laterality: N/A;   PARATHYROIDECTOMY N/A 10/23/2016   Procedure: PARATHYROIDECTOMY;  Surgeon: Oralee Billow, MD;   Location: [facility] OR;  Service: General;  Laterality: N/A;   REVERSE SHOULDER ARTHROPLASTY Right 08/24/2020   Procedure: REVERSE SHOULDER ARTHROPLASTY;  Surgeon: Micheline Ahr, MD;  Location: [facility] OR;  Service: Orthopedics;  Laterality: Right;   REVISON OF ARTERIOVENOUS FISTULA Right 07/16/2017   Procedure: REVISION OF ARTERIOVENOUS FISTULA  Right ARM;  Surgeon: Adine Hoof, MD;  Location: [facility] OR;  Service: Vascular;  Laterality: Right;   TOTAL HIP ARTHROPLASTY Left 11/14/2020   Procedure: LEFT TOTAL HIP ARTHROPLASTY ANTERIOR APPROACH;  Surgeon: Wes Hamman, MD;  Location: [facility] OR;  Service: Orthopedics;  Laterality: Left;   UPPER GASTROINTESTINAL ENDOSCOPY  2021   @ [facility]    Assessment & Plan Clinical Impression: Patient Sr is a 63 year old male (Jehovah's witness but has accepted blood products in the past) with history of ESRD-HD MWF, duodenitis/gastritis w/recurrent GIB, GERD, seizure, HTN, OSA, RCC s/p B-lap nephrectomies, cerebral venous stenosis, CVA w/o residual deficits, chronic HA who has had progressive numbness with pain BUE, right foot drop X 2 weeks and balance issues who fell due to knees giving away and struck his face. He was unable to lift his legs and had weakness of BUE and presented to ED for evaluation on  05/25/23. MRI brain showed chronic L-PCA stroke iwht large left mastoid and middle ear effusion. CT C spine showed subacute C4 burst fracture with severe canal stenosis superimposed on C4/5 & C5-6 advanced spondylosis with cord edema and/or evolving myelomalacia C3-C6. He was evaluated by Dr. Adonis Alamin, found to have tetraplegia BUE>BLE and was taken to OR emergently for C4 anterior corpectomy with C3-C5 anterior fusion on 05/26/23 am. Post procedure HD ongoing and home gabapentin  resumed. Patient transferred to CIR on 05/30/2023 .    Patient currently requires mod-max A with basic self-care skills secondary to muscle weakness and muscle joint tightness, decreased cardiorespiratoy  endurance, impaired timing and sequencing, unbalanced muscle activation, ataxia, and decreased coordination, decreased midline orientation, and decreased sitting balance, decreased standing balance, decreased postural control, decreased balance strategies, and difficulty maintaining precautions.  Prior to hospitalization, patient could complete BADLs with light Min A.   Patient will benefit from skilled intervention to decrease level of assist with basic self-care skills prior to discharge home with care partner.  Anticipate patient will require minimal physical assistance and follow up home health.  OT - End of Session Activity Tolerance: Tolerates 10 - 20 min activity with multiple rests Endurance Deficit: Yes OT Assessment Rehab Potential (ACUTE ONLY): Fair OT Barriers to Discharge: Inaccessible home environment;Home environment access/layout;Wound Care;Lack of/limited family support;Hemodialysis OT Patient demonstrates impairments in the following area(s): Balance;Endurance;Motor;Pain;Perception;Safety;Sensory;Skin Integrity OT Basic ADL's Functional Problem(s): Eating;Grooming;Bathing;Dressing;Toileting OT Transfers Functional Problem(s): Toilet;Tub/Shower OT Additional Impairment(s): Fuctional Use of Upper Extremity OT Plan OT Intensity: Minimum of 1-2 x/day, 45 to 90 minutes OT Frequency: 5 out of 7 days OT Duration/Estimated Length of Stay: ~4 weeks OT Treatment/Interventions: Balance/vestibular training;Cognitive remediation/compensation;Community reintegration;Discharge planning;DME/adaptive equipment instruction;Functional electrical stimulation;Functional mobility training;Disease mangement/prevention;Neuromuscular re-education;Pain management;Patient/family education;Psychosocial support;Skin care/wound managment;Self Care/advanced ADL retraining;Therapeutic Activities;Therapeutic Exercise;Splinting/orthotics;UE/LE Strength taining/ROM;Visual/perceptual remediation/compensation;UE/LE  Coordination activities;Wheelchair propulsion/positioning OT Self Feeding Anticipated Outcome(s): Setup A OT Basic Self-Care Anticipated Outcome(s): Min A OT Toileting Anticipated Outcome(s): Min A OT Bathroom Transfers Anticipated Outcome(s): Min A OT Recommendation Recommendations for Other Services: Neuropsych consult;Therapeutic Recreation consult Therapeutic Recreation Interventions: Stress management Patient destination: Home Follow Up Recommendations: Home health OT Equipment Recommended: To be determined   OT Evaluation Precautions/Restrictions  Precautions Precautions: Fall;Cervical Recall of Precautions/Restrictions: Impaired Precaution/Restrictions Comments: Verbally reviewed cervical precautions. Required Braces or Orthoses: Cervical Brace Cervical Brace: Hard collar Restrictions Weight Bearing Restrictions Per Provider Order: No General Chart Reviewed: Yes Family/Caregiver Present: No Pain Pain Assessment Pain Scale: 0-10 Pain Score: 6  Pain Location: Head Pain Intervention(s): Repositioned;Medication (See eMAR);Rest Home Living/Prior Functioning Home Living Family/patient expects to be discharged to:: Unsure Living Arrangements: Children Available Help at Discharge: Family, Available 24 hours/day (Son is self-employed.) Type of Home: Apartment Home Access: Stairs to enter [employer]: 2nd floor apartment; ~18 stairs Entrance Stairs-Rails: Right, Left Home Layout: One level Bathroom Shower/Tub: Tub/shower unit (No DME) Bathroom Toilet: Handicapped height  Lives With: Son IADL History 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 Function Level of Independence: Requires assistive device for independence, Needs assistance with ADLs, Needs assistance with tranfers Dressing: Minimal Driving: Yes Vision Baseline Vision/History: 1 Wears glasses (Readers) Ability to See in Adequate Light: 1 Impaired Patient  Visual Report: No change from baseline Vision Assessment?: No apparent visual deficits Perception  WFL Praxis WFL Cognition Cognition Overall Cognitive Status: Within Functional Limits for tasks assessed Arousal/Alertness: Awake/alert Orientation Level: Person;Place;Situation Memory: Appears intact Awareness: Appears intact Problem Solving: Appears intact Safety/Judgment: Appears intact Brief Interview for Mental Status (BIMS)  Repetition of Three Words (First Attempt): 3 Temporal Orientation: Year: Correct Temporal Orientation: Month: Accurate within 5 days Temporal Orientation: Day: Correct Recall: "Sock": Yes, no cue required Recall: "Blue": Yes, no cue required Recall: "Bed": Yes, no cue required BIMS Summary Score: 15 Sensation Sensation Light Touch: Impaired Detail Peripheral sensation comments: Impaired BLE/BUE, distal>proximal, R>L Light Touch Impaired Details: Impaired RUE;Impaired LUE;Impaired RLE;Impaired LLE Hot/Cold: Appears Intact Proprioception: Impaired by gross assessment Stereognosis: Not tested Coordination Gross Motor Movements are Fluid and Coordinated: No Fine Motor Movements are Fluid and Coordinated: No Coordination and Movement Description: Deficits in coordination/strength consistent with new SCI. Motor  Motor Motor: Tetraplegia  Trunk/Postural Assessment  Cervical Assessment Cervical Assessment: Exceptions to [facility] (Cervical precautions) Thoracic Assessment Thoracic Assessment: Exceptions to [facility] (Rounded shoulders) Lumbar Assessment Lumbar Assessment: Exceptions to [facility] (Posterior pelvic tilt) Postural Control Postural Control: Deficits on evaluation Trunk Control: Decreased Righting Reactions: Delayed Protective Responses: Delayed  Balance Balance Balance Assessed: Yes Static Sitting Balance Static Sitting - Balance Support: Feet supported Static Sitting - Level of Assistance: 5: Stand by assistance (SUP-CGA) Dynamic Sitting  Balance Dynamic Sitting - Balance Support: Feet supported;During functional activity Dynamic Sitting - Level of Assistance: 5: Stand by assistance;4: Min assist (CGA-Min A) Dynamic Sitting - Balance Activities: Lateral lean/weight shifting;Forward lean/weight shifting;Reaching for objects Extremity/Trunk Assessment RUE Assessment RUE Assessment: Within Functional Limits Active Range of Motion (AROM) Comments: History of shoulder surgery (Limited ROM PTA); ~100 degrees of elbow extension, WFL for elbow flexion; non-functional digital extension/flexion General Strength Comments: 2+/5 LUE Assessment LUE Assessment: Exceptions to [facility] Active Range of Motion (AROM) Comments: ~90 degrees of shoulder abduction; WFL for elbow flexio/extension; [facility] for digital flexion/extension, weak grasp, dysmetria General Strength Comments: 2+/5  Care Tool--SEE FLOWSHEETS  Refer to Care Plan for Long Term Goals  SHORT TERM GOAL WEEK 1 OT Short Term Goal 1 (Week 1): Pt will perform toilet transfer with consistent Mod A (x1) + LRAD. OT Short Term Goal 2 (Week 1): Pt will thread BLE into LB garments with Min A + LRAD. OT Short Term Goal 3 (Week 1): Pt will bathe UB with Min A + LRAD.  Recommendations for other services: Neuropsych and Therapeutic Recreation  Stress management   Skilled Therapeutic Intervention   Session 1: Session began with introduction to OT role, OT POC, and general orientation to rehab unit/schedule. Pt defers sponge-bathing this session, due to nursing having assisted with care earlier in the morning hours. Levels below based on clinical judgement, alongside patient's actual dressing performance. Stedy +2 utilized during session for sit<>stand transfers and [employer] (as multiple were trailed for fit).  ADL ADL Eating: Set up;Minimal assistance Where Assessed-Eating: Wheelchair Grooming: Setup;Minimal assistance Where Assessed-Grooming: Wheelchair Upper Body Bathing: Minimal  assistance;Moderate assistance Where Assessed-Upper Body Bathing: Wheelchair Lower Body Bathing: Moderate assistance;Maximal assistance Where Assessed-Lower Body Bathing: Bed level Upper Body Dressing: Moderate assistance Where Assessed-Upper Body Dressing: Wheelchair Lower Body Dressing: Maximal assistance Where Assessed-Lower Body Dressing: Bed level Toileting: Moderate assistance;Maximal assistance Where Assessed-Toileting: Toilet;Bedside Commode Toilet Transfer: Other (comment) (Stedy +2) Toilet Transfer Method: Other (comment) (Stedy +2) Acupuncturist: Gaffer: Unable to assess Film/video editor: Unable to assess Mobility  Transfers Sit to Stand: 2 Helpers (Use of stedy) Stand to Sit: 2 Helpers (Use of stedy)  Session 2: Pt received seated in TIS WC, requiring some encouragement to arouse for scheduled therapy session. Pt dependently transported from room<>main therapy gym. Pt voicing not having received second breakfast tray (as  first one was cold and he did not want it), therefore time dedicated to addressing self-feeding with use of modified utensils and yogurt (due to consistency). Plastic utensil adapted with small piece of red tubing, pt's R-hand unable to maintain functional grasp (despite being dominant hand), but with loose L pincer-grip patient is able to self feed almost 100% of yogurt cup, last bite dependently feed due to fatigue. Pt's R hand unsuccessful for self-feeding when "easy hold" strap implemented due to deficits in pronation/supination. When placed on L-hand, additional/maladaptive movements incorporated, therefore patient educated on use of red tubing during meals. Sign posted outside patient room for setup/intermediate supervision with techniques noted above. Time dedicated to assessed BUE ROM, see details above. Pt transitions to EOB with stedy +2, Mod A for BLE elevation and truncal guidance back to supine. Pt assisted  with making phone call to dietary services, remained resting in bed, LPN/NT present for meal assistance.   Discharge Criteria: Patient will be discharged from OT if patient refuses treatment 3 consecutive times without medical reason, if treatment goals not met, if there is a change in medical status, if patient makes no progress towards goals or if patient is discharged from hospital.  The above assessment, treatment plan, treatment alternatives and goals were discussed and mutually agreed upon: by patient  Artemus Biles, OTR/L, MSOT  05/31/2023, 9:01 AM

## 2023-05-31 NOTE — Progress Notes (Signed)
 [name] Progress Note   Subjective:   Patient seen in room. He was sleeping peacefully when I first entered the room. He reports that he is doing well with no complaints or concerns. Wearing C-spine and has honeycomb dressing on neck. No reported dyspnea or CP. Plan for HD today 05/31/23. Patient is awaiting his therapy session.  Objective Vitals:   05/30/23 1931 05/30/23 2031 05/31/23 0413 05/31/23 0807  BP: (!) 156/87 (!) 153/77 122/81 (!) 153/84  Pulse: 94 96 92 81  Resp: 16 16 17    Temp: 98 F (36.7 C) 98.2 F (36.8 C) 98.2 F (36.8 C)   TempSrc: Oral Oral Oral   SpO2:  100% 100%   Weight: 99.5 kg  99 kg   Height: 6' 1 (1.854 m)      Physical Exam General: Pleasant AA male, no acute distress, in C-spine collar with honeycomb dressing Heart: RRR, no murmur, gallops, or rubs Lungs: CTA bilaterally, no wheeze or rales  Abdomen: soft, NT, ND, +b/s Extremities: No bruising or lesions, No LEE, euvolemic on exam Dialysis Access:  RFA AVF +t/b  Additional Objective Labs: Basic Metabolic Panel: Recent Labs  Lab 05/25/23 1830 05/25/23 1933 05/27/23 0921 05/29/23 2150  NA 140 140 136 130*  K 3.8 3.8 4.5 5.5*  CL 96* 97* 94* 92*  CO2 32  --  27 24  GLUCOSE 107* 89 95 108*  BUN 35* 36* 54* 47*  CREATININE 7.18* 7.60* 9.65* 9.36*  CALCIUM  9.8  --  9.1 8.8*  PHOS  --   --   --  6.2*   Liver Function Tests: Recent Labs  Lab 05/25/23 1830 05/27/23 0921 05/29/23 2150  AST 21 15  --   ALT 13 6  --   ALKPHOS 70 71  --   BILITOT 0.6 0.5  --   PROT 7.4 6.9  --   ALBUMIN  3.2* 2.8* 2.9*    CBC: Recent Labs  Lab 05/25/23 1830 05/25/23 1933 05/27/23 0921 05/29/23 2155  WBC 6.4  --  7.3 8.2  NEUTROABS 4.4  --   --   --   HGB 9.9* 10.5* 8.4* 9.3*  HCT 31.8* 31.0* 26.1* 29.5*  MCV 92.2  --  90.3 90.2  PLT 203  --  199 266   Blood Culture    Component Value Date/Time   SDES BLOOD LEFT HAND 08/01/2015 0624   SPECREQUEST BOTTLES DRAWN AEROBIC AND  ANAEROBIC  5CC 08/01/2015 0624   CULT NO GROWTH 5 DAYS 08/01/2015 0624   REPTSTATUS 08/06/2015 FINAL 08/01/2015 0624    Cardiac Enzymes: No results for input(s): CKTOTAL, CKMB, CKMBINDEX, TROPONINI in the last 168 hours. CBG: Recent Labs  Lab 05/25/23 1904 05/26/23 0344  GLUCAP 81 90   Iron Studies: No results for input(s): IRON, TIBC, TRANSFERRIN, FERRITIN in the last 72 hours. @lablastinr3 @ Studies/Results: DG Abd 1 View Result Date: 05/30/2023 CLINICAL DATA:  Constipation EXAM: ABDOMEN - 1 VIEW COMPARISON:  None Available. FINDINGS: The bowel gas pattern is normal. No radio-opaque calculi or other significant radiographic abnormality are seen. No excessive stool burden. Prior left hip replacement. Visualized lung bases clear. IMPRESSION: No acute findings. Electronically Signed   By: Franky Crease M.D.   On: 05/30/2023 21:08   Medications:  anticoagulant sodium citrate       [START ON 06/06/2023] atorvastatin   10 mg Oral Q Thu   carbamide peroxide  5 drop Right EAR BID   Chlorhexidine  Gluconate Cloth  6 each Topical Q0600   [  START ON 06/01/2023] Chlorhexidine  Gluconate Cloth  6 each Topical Q0600   cinacalcet   120 mg Oral Q supper   gabapentin   300 mg Oral QHS   heparin   3,000 Units Dialysis Once in dialysis   pantoprazole   40 mg Oral BID AC   polyethylene glycol  17 g Oral TID   senna-docusate  2 tablet Oral BID   sevelamer  carbonate  3,200 mg Oral TID WC   topiramate   25 mg Oral BID   OP HD: [facility] MWF 4h  B400   85.5kg   2K bath  AVF Heparin  3000 Mircera 60mcg IV q 2 weeks- last dose 05/24/23 Calcitriol  1.5mcg PO q HD   Renal -related home meds: Sensipar  120 mg at bedtime Lokelma  10 g daily Rena-Vite 1 daily Renvela  3-4 AC 3 times daily Others: Carafate  1 g twice daily as needed, PPI, Percocet, Neurontin , eyedrops, amoxicillin , Lipitor   Assessment/Plan: 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   Patient in rehab.  ESRD: HD MWF. Next HD today 05/31/23.  HTN: BP's a bit high 153/84 today but is scheduled to get dialysis today  Volume: stable euvolemic on exam.  Anemia: Hgb 8- 10.5, ESA recently dosed Metabolic bone disease: Corrected Ca 9.7, holding VDRA for now. Phos 6.2 on Sevelamer  binder  Nutrition:  On renal diet. Alb 2.9. Adding prosource supplement     Belvie Och, AGNP 05/31/2023, 1:43 PM  [employer]

## 2023-05-31 NOTE — Progress Notes (Addendum)
 PROGRESS NOTE   Subjective/Complaints: No acute events overnight noted.  Reports bowel movement was a little hard yesterday.  Reports he does not make urine.  ROS: Patient denies fever, new vision changes, dizziness, nausea, vomiting, diarrhea,  shortness of breath or chest pain,  or mood change.   Objective:   DG Abd 1 View Result Date: 05/30/2023 CLINICAL DATA:  Constipation EXAM: ABDOMEN - 1 VIEW COMPARISON:  None Available. FINDINGS: The bowel gas pattern is normal. No radio-opaque calculi or other significant radiographic abnormality are seen. No excessive stool burden. Prior left hip replacement. Visualized lung bases clear. IMPRESSION: No acute findings. Electronically Signed   By: Franky Crease M.D.   On: 05/30/2023 21:08   Recent Labs    05/29/23 2155  WBC 8.2  HGB 9.3*  HCT 29.5*  PLT 266   Recent Labs    05/29/23 2150  NA 130*  K 5.5*  CL 92*  CO2 24  GLUCOSE 108*  BUN 47*  CREATININE 9.36*  CALCIUM  8.8*   No intake or output data in the 24 hours ending 05/31/23 1250      Physical Exam: Vital Signs Blood pressure (!) 153/84, pulse 81, temperature 98.2 F (36.8 C), temperature source Oral, resp. rate 17, height 6' 1 (1.854 m), weight 99 kg, SpO2 100%.  Constitutional: No apparent distress. Appropriate appearance for age.  HENT: + Cervical collar. Anterior neck honecomb dressing, mucous membranes moist Cardiovascular: RRR  Respiratory: CTAB. No rales, rhonchi, or wheezing. On RA.  Abdomen: + bowel sounds, normoactive. No distention or tenderness.  GU: Not examined.   Skin: C/D/I. Multiple lesions on BL legs with various stages of healing.  + RUE fistula - intact   MSK:      LLE knee with palpable effusion, no warmth, no ttp Mood: Pleasant affect, appropriate mood.  Neurologic exam: Alert and oriented x 4, fluent, follows commands, cranial nerves II through XII grossly intact, no hypertonia  noted  Sensation: Reduced to light touch in distal right lower extremity to the mid-thigh       Strength:                RUE: 2/5 SA, 2/5 EF, 2/5 EE, 2/5 WE, 2/5 FF, 2/5 FA                LUE:  3/5 SA, 3/5 EF, 3/5 EE, 3/5 WE, 3/5 FF, 3/5 FA                RLE: 2/5 HF, 2/5 KE, 2/5  DF, 2/5  EHL, 2/5  PF                 LLE:  3/5 HF, 4/5 KE, 4/5  DF, 4/5  EHL, 4/5  PF  Neuroexam stable 4/18  Assessment/Plan: 1. Functional deficits which require 3+ hours per day of interdisciplinary therapy in a comprehensive inpatient rehab setting. Physiatrist is providing close team supervision and 24 hour management of active medical problems listed below. Physiatrist and rehab team continue to assess barriers to discharge/monitor patient progress toward functional and medical goals  Care Tool:  Bathing  Bathing assist       Upper Body Dressing/Undressing Upper body dressing        Upper body assist      Lower Body Dressing/Undressing Lower body dressing            Lower body assist       Toileting Toileting Toileting Activity did not occur (Clothing management and hygiene only): N/A (no void or bm) (anuric, HD patient)  Toileting assist       Transfers Chair/bed transfer  Transfers assist     Chair/bed transfer assist level: 2 Helpers     Locomotion Ambulation   Ambulation assist   Ambulation activity did not occur: Safety/medical concerns          Walk 10 feet activity   Assist  Walk 10 feet activity did not occur: Safety/medical concerns        Walk 50 feet activity   Assist Walk 50 feet with 2 turns activity did not occur: Safety/medical concerns         Walk 150 feet activity   Assist Walk 150 feet activity did not occur: Safety/medical concerns         Walk 10 feet on uneven surface  activity   Assist Walk 10 feet on uneven surfaces activity did not occur: Safety/medical concerns          Wheelchair     Assist Is the patient using a wheelchair?: Yes Type of Wheelchair: Manual    Wheelchair assist level: Dependent - Patient 0% Max wheelchair distance: 150 ft    Wheelchair 50 feet with 2 turns activity    Assist        Assist Level: Dependent - Patient 0%   Wheelchair 150 feet activity     Assist      Assist Level: Dependent - Patient 0%   Blood pressure (!) 153/84, pulse 81, temperature 98.2 F (36.8 C), temperature source Oral, resp. rate 17, height 6' 1 (1.854 m), weight 99 kg, SpO2 100%.  Medical Problem List and Plan: 1. 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  Antithrombotics: -DVT/anticoagulation:  Mechanical: Sequential compression devices, below knee Bilateral lower extremities             -antiplatelet therapy: N/A--no antiplatelet due to hx of recurrent GIB 3. Pain Management:  oxycodone  prn. Gabapentin  300 mg/hs.              --has Flexeril  10 mg TID prn              - patient with intermittent, severe b/l headache - add topomax 25 mg BID    4. Mood/Behavior/Sleep: LCSW to follow for evaluation and support             -antipsychotic agents: N/A             --melatonin prn for insomnia.  5. Neuropsych/cognition: This patient is capable of making decisions on his own behalf. 6. Skin/Wound Care: Routine pressure relief measures.  7. Fluids/Electrolytes/Nutrition: Strict I/O. Daily weights. Labs with HD. 8. ESRD- HD MWF: Schedule HD at the end of the day to help with tolerance of therapy             --Sensipar  and Renvela  for metabolic bone disease.   - Patient reports no longer makes urine 9. Anemia of  chronic disease: Monitor H/H 10. H/o recurrent GIB/Jehovah's Witness: Continue Protonix  BID 11. H/o Hypotension: Has had drop  in BP with HD. Now cervical SCI.  --Monitor for symptoms day after HD and with activity.  -4/18 baseline blood pressure stable, monitor with activity    05/31/2023    8:07 AM 05/31/2023    4:13 AM 05/30/2023    8:31 PM  Vitals with BMI  Weight  218 lbs 4 oz   BMI  28.8   Systolic 153 122 846  Diastolic 84 81 77  Pulse 81 92 96    12. Constipation: Small BM on 04/14 --had of nulytey yesterday and another today?  -Xray 4/17 without excessive stool, patient reports stool still little hard yesterday.  Will increase MiraLAX  to 3 times a day, continue Senokot twice a day. Suppository PRN  13. ?left knee effusion. Not currently bothersome. Consider imaging.       LOS: 1 days A FACE TO FACE EVALUATION WAS PERFORMED  Murray Collier 05/31/2023, 12:50 PM

## 2023-05-31 NOTE — Plan of Care (Signed)
  Problem: RH Balance Goal: LTG: Patient will maintain dynamic sitting balance (OT) Description: LTG:  Patient will maintain dynamic sitting balance with assistance during activities of daily living (OT) Flowsheets (Taken 05/31/2023 1819) LTG: Pt will maintain dynamic sitting balance during ADLs with: Independent with assistive device   Problem: RH Eating Goal: LTG Patient will perform eating w/assist, cues/equip (OT) Description: LTG: Patient will perform eating with assist, with/without cues using equipment (OT) Flowsheets (Taken 05/31/2023 1819) LTG: Pt will perform eating with assistance level of: Independent with assistive device    Problem: RH Grooming Goal: LTG Patient will perform grooming w/assist,cues/equip (OT) Description: LTG: Patient will perform grooming with assist, with/without cues using equipment (OT) Flowsheets (Taken 05/31/2023 1819) LTG: Pt will perform grooming with assistance level of: Independent with assistive device    Problem: RH Bathing Goal: LTG Patient will bathe all body parts with assist levels (OT) Description: LTG: Patient will bathe all body parts with assist levels (OT) Flowsheets (Taken 05/31/2023 1819) LTG: Pt will perform bathing with assistance level/cueing: Minimal Assistance - Patient > 75%   Problem: RH Dressing Goal: LTG Patient will perform upper body dressing (OT) Description: LTG Patient will perform upper body dressing with assist, with/without cues (OT). Flowsheets (Taken 05/31/2023 1819) LTG: Pt will perform upper body dressing with assistance level of:  Independent with assistive device  Not including orthosis Goal: LTG Patient will perform lower body dressing w/assist (OT) Description: LTG: Patient will perform lower body dressing with assist, with/without cues in positioning using equipment (OT) Flowsheets (Taken 05/31/2023 1819) LTG: Pt will perform lower body dressing with assistance level of: Minimal Assistance - Patient > 75%    Problem: RH Toileting Goal: LTG Patient will perform toileting task (3/3 steps) with assistance level (OT) Description: LTG: Patient will perform toileting task (3/3 steps) with assistance level (OT)  Flowsheets (Taken 05/31/2023 1819) LTG: Pt will perform toileting task (3/3 steps) with assistance level: Minimal Assistance - Patient > 75%   Problem: RH Functional Use of Upper Extremity Goal: LTG Patient will use RT/LT upper extremity as a (OT) Description: LTG: Patient will use right/left upper extremity as a stabilizer/gross assist/diminished/nondominant/dominant level with assist, with/without cues during functional activity (OT) Flowsheets (Taken 05/31/2023 1819) LTG: Use of upper extremity in functional activities:  RUE as gross assist level  LUE as diminished level LTG: Pt will use upper extremity in functional activity with assistance level of: Independent with assistive device   Problem: RH Toilet Transfers Goal: LTG Patient will perform toilet transfers w/assist (OT) Description: LTG: Patient will perform toilet transfers with assist, with/without cues using equipment (OT) Flowsheets (Taken 05/31/2023 1819) LTG: Pt will perform toilet transfers with assistance level of: Minimal Assistance - Patient > 75%   Problem: RH Tub/Shower Transfers Goal: LTG Patient will perform tub/shower transfers w/assist (OT) Description: LTG: Patient will perform tub/shower transfers with assist, with/without cues using equipment (OT) Flowsheets (Taken 05/31/2023 1819) LTG: Pt will perform tub/shower stall transfers with assistance level of: Minimal Assistance - Patient > 75%

## 2023-05-31 NOTE — Progress Notes (Signed)
 Inpatient Rehabilitation Care Coordinator Assessment and Plan Patient Details  Name: Carl Dupuis Sr. MRN: [id number] Date of Birth: [birth year]  Today's Date: 05/31/2023  Hospital Problems: Principal Problem:   Spinal cord injury, cervical region, sequela [facility])  Past Medical History:  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    Parathyroid  abnormality (HCC)    ectopic parathyroid  gland   Presence of arteriovenous fistula for hemodialysis, primary (HCC)    RUE PER PT RLE   Refusal of blood product    NO WHOLE BLOOD PROUCTS   Renal cell carcinoma (HCC)    s/p hand assisted laparoscopic bilateral nephrectomies 11/29/17, + RCC left   Secondary hyperparathyroidism (HCC)    Seizures (HCC)    one episode in past, due to" elevated Potassium" 08/02/20- "at least 4 years ago"   Sleep apnea    doesn't use CPAP anymore since weight loss   Stroke [facility])    no residual   Past Surgical History:  Past Surgical History:  Procedure Laterality Date   ANTERIOR CERVICAL CORPECTOMY N/A 05/25/2023   Procedure: ANTERIOR CERVICAL CORPECTOMY CERVICAL FOUR, CERVICAL THREE - CERVICAL FIVE FUSION;  Surgeon: [name], MD;  Location: [facility] OR;  Service: Neurosurgery;  Laterality: N/A;   AV FISTULA PLACEMENT Right    right arm   BIOPSY  08/01/2019   Procedure: BIOPSY;  Surgeon: Tami Falcon, MD;  Location: [facility] ENDOSCOPY;  Service: Endoscopy;;   BIOPSY  11/18/2020   Procedure:  BIOPSY;  Surgeon: Elois Hair, MD;  Location: [facility] ENDOSCOPY;  Service: Gastroenterology;;   BIOPSY  09/13/2021   Procedure: BIOPSY;  Surgeon: Normie Becton., MD;  Location: [facility] ENDOSCOPY;  Service: Gastroenterology;;   CATARACT EXTRACTION W/ INTRAOCULAR LENS  IMPLANT, BILATERAL     COLON SURGERY     COLONOSCOPY N/A 08/04/2015   Procedure: COLONOSCOPY;  Surgeon: Nannette Babe, MD;  Location: [facility] ENDOSCOPY;  Service: Endoscopy;  Laterality: N/A;   COLONOSCOPY  2017   JMP@ [facility]-good prep-mass -recall 1 yr   COLONOSCOPY N/A 09/13/2021   Procedure: COLONOSCOPY;  Surgeon: Mansouraty, Albino Alu., MD;  Location: [facility] ENDOSCOPY;  Service: Gastroenterology;  Laterality: N/A;   COLONOSCOPY WITH PROPOFOL  N/A 11/25/2020   Procedure: COLONOSCOPY WITH PROPOFOL ;  Surgeon: Daina Drum, MD;  Location: [facility] ENDOSCOPY;  Service: Gastroenterology;  Laterality: N/A;   COLONOSCOPY WITH PROPOFOL  N/A 09/23/2021   Procedure: COLONOSCOPY WITH PROPOFOL ;  Surgeon: Annis Kinder, DO;  Location: [facility] ENDOSCOPY;  Service: Gastroenterology;  Laterality: N/A;   ENTEROSCOPY N/A 09/23/2021   Procedure: ENTEROSCOPY;  Surgeon: Annis Kinder, DO;  Location: [facility] ENDOSCOPY;  Service: Gastroenterology;  Laterality: N/A;  Will place order for video capsule study as we may opt to place it during procedure   ESOPHAGOGASTRODUODENOSCOPY N/A 08/01/2019   Procedure: ESOPHAGOGASTRODUODENOSCOPY (  EGD);  Surgeon: Tami Falcon, MD;  Location: [facility] ENDOSCOPY;  Service: Endoscopy;  Laterality: N/A;   ESOPHAGOGASTRODUODENOSCOPY N/A 11/18/2020   Procedure: ESOPHAGOGASTRODUODENOSCOPY (EGD);  Surgeon: Elois Hair, MD;  Location: [facility] ENDOSCOPY;  Service: Gastroenterology;  Laterality: N/A;   ESOPHAGOGASTRODUODENOSCOPY (EGD) WITH PROPOFOL  N/A 08/04/2019   Procedure: ESOPHAGOGASTRODUODENOSCOPY (EGD) WITH PROPOFOL ;  Surgeon: Ace Holder, MD;  Location: [facility] ENDOSCOPY;  Service: Gastroenterology;  Laterality: N/A;    ESOPHAGOGASTRODUODENOSCOPY (EGD) WITH PROPOFOL  N/A 09/13/2021   Procedure: ESOPHAGOGASTRODUODENOSCOPY (EGD) WITH PROPOFOL ;  Surgeon: Mansouraty, Albino Alu., MD;  Location: [facility] ENDOSCOPY;  Service: Gastroenterology;  Laterality: N/A;   GIVENS CAPSULE STUDY N/A 09/23/2021   Procedure: GIVENS CAPSULE STUDY;  Surgeon: Annis Kinder, DO;  Location: [facility] ENDOSCOPY;  Service: Gastroenterology;  Laterality: N/A;   graft left arm Left    for dialysis x 2. Removed   HOT HEMOSTASIS  11/18/2020   Procedure: HOT HEMOSTASIS (ARGON PLASMA COAGULATION/BICAP);  Surgeon: Elois Hair, MD;  Location: [facility] ENDOSCOPY;  Service: Gastroenterology;;   HOT HEMOSTASIS N/A 09/23/2021   Procedure: HOT HEMOSTASIS (ARGON PLASMA COAGULATION/BICAP);  Surgeon: Annis Kinder, DO;  Location: [facility] ENDOSCOPY;  Service: Gastroenterology;  Laterality: N/A;   INSERTION OF DIALYSIS CATHETER     Rt chest   LAPAROSCOPIC RIGHT COLECTOMY N/A 08/05/2015   Procedure: LAPAROSCOPIC RIGHT COLECTOMY- ASCENDING;  Surgeon: Lockie Rima, MD;  Location: [facility] OR;  Service: General;  Laterality: N/A;   MASS EXCISION Left 05/28/2019   Procedure: EXCISION SOFT TISSUE MASS LEFT SHOULDER;  Surgeon: Oralee Billow, MD;  Location: [facility] ORS;  Service: General;  Laterality: Left;   NEPHRECTOMY Bilateral    PARATHYROIDECTOMY N/A 06/12/2016   Procedure: TOTAL PARATHYROIDECTOMY WITH AUTOTRANSPLANTATION TO LEFT FOREARM;  Surgeon: Oralee Billow, MD;  Location: [facility] OR;  Service: General;  Laterality: N/A;   PARATHYROIDECTOMY N/A 10/23/2016   Procedure: PARATHYROIDECTOMY;  Surgeon: Oralee Billow, MD;  Location: [facility] OR;  Service: General;  Laterality: N/A;   REVERSE SHOULDER ARTHROPLASTY Right 08/24/2020   Procedure: REVERSE SHOULDER ARTHROPLASTY;  Surgeon: Micheline Ahr, MD;  Location: [facility] OR;  Service: Orthopedics;  Laterality: Right;   REVISON OF ARTERIOVENOUS FISTULA Right 07/16/2017   Procedure: REVISION OF ARTERIOVENOUS FISTULA  Right ARM;  Surgeon: Adine Hoof, MD;  Location: [facility] OR;  Service: Vascular;  Laterality: Right;   TOTAL HIP ARTHROPLASTY Left 11/14/2020   Procedure: LEFT TOTAL HIP ARTHROPLASTY ANTERIOR APPROACH;  Surgeon: Wes Hamman, MD;  Location: [facility] OR;  Service: Orthopedics;  Laterality: Left;   UPPER GASTROINTESTINAL ENDOSCOPY  2021   @ [facility]   Social History:  reports that he quit smoking about 32 years ago. His smoking use included cigarettes. He has never used smokeless tobacco. He reports that he does not currently use alcohol after a past usage of about 1.0 standard drink of alcohol per week. He reports that he does not currently use drugs after having used the following drugs: Marijuana.  Family / Support Systems Marital Status: Widow/Widower Patient Roles: Parent, Other (Comment) (Friend) ChildrenLuretha [name] [phone] lives with pt   [name]-daughter [phone] lives in [place] Other Supports: [name]-brother [phone] Anticipated Caregiver: Son Ability/Limitations of Caregiver: Son is self employed with pressure washing business Caregiver Availability: Other (Comment) (Still working on care for home) Family Dynamics: Close with son and daughter, daughter lives in [place] and son lives with him but does work. His brother can come by and check on him. Barrier are the flight of steps he lives up  Social History  Preferred language: English Religion: Jehovah's Witness Cultural Background: NA Education: HS Health Literacy - How often do you need to have someone help you when you read instructions, pamphlets, or other written material from your doctor or pharmacy?: Never Writes: Yes Employment Status: Disabled Marine scientist Issues: NA Guardian/Conservator: None-according to MD pt is capable of making his own decisions while here   Abuse/Neglect Abuse/Neglect Assessment Can Be Completed: Yes Physical Abuse: Denies Verbal Abuse: Denies Sexual Abuse: Denies Exploitation of patient/patient's resources:  Denies Self-Neglect: Denies  Patient response to: Social Isolation - How often do you feel lonely or isolated from those around you?: Rarely  Emotional Status Pt's affect, behavior and adjustment status: Pt is doing better but reports has a long way to go to recover. He was independent but was having some issues with weakness on his right side and had planned to stop driving. They live on the second floor apartment and this will be an issue with his HD three times per week Recent Psychosocial Issues: other health issues M W F OP-HD Psychiatric History: No history with all that has happened he would benefit from seeing neuro-psych while here Substance Abuse History: THC but not sure will stop  Patient / Family Perceptions, Expectations & Goals Pt/Family understanding of illness & functional limitations: Pt is able to explain his fall and back issues, he was told it saved his life the fall so can have the surgery and recover. He does talk with the MD's involved and feels understands his deficits. Premorbid pt/family roles/activities: father, retiree, dialysis pt, brother, etc Anticipated changes in roles/activities/participation: resume Pt/family expectations/goals: Pt States: " My son wants to be paid to take care of me can he?"  [employer]: Other (Comment) (OP-HD M W F [facility] 5:25 am slot) Premorbid Home Care/DME Agencies: Other (Comment) (cane, [facility], [facility], rw) Transportation available at discharge: self and son Is the patient able to respond to transportation needs?: Yes In the past 12 months, has lack of transportation kept you from medical appointments or from getting medications?: No In the past 12 months, has lack of transportation kept you from meetings, work, or from getting things needed for daily living?: No Resource referrals recommended: Neuropsychology  Discharge Planning Living Arrangements: Children Support Systems: Children, Other relatives,  Friends/neighbors Type of Residence: Private residence Insurance Resources: Media planner (specify) (UHC Medicare and Medicaid) Financial Resources: Tree surgeon, Family Support Financial Screen Referred: No Living Expenses: Rent Money Management: Patient, Family Does the patient have any problems obtaining your medications?: No Home Management: both Patient/Family Preliminary Plans: Return home with son the issue is they live on the second floor and he will ned to go back and forth to HD three times per week. Son inquiring about moving to first floor apartment and not sure when this will become available. Son also works so not sure who will provide care to pt at DC Care Coordinator Barriers to Discharge: Inaccessible home environment, Decreased caregiver support, Lack of/limited family support, Home environment access/layout, Hemodialysis, Insurance for SNF coverage Care Coordinator Anticipated Follow Up Needs: HH/OP, SNF  Clinical Impression Pleasant gentleman who is encouraged by his progress but still has a ways to go. His son whom lives with him is involved but does work. Pt also goes to OP-HD and lives on the second floor so a flight of stairs needs to be negotiated. Multiple barriers presented will work on plan and await team's evaluations  [name] [name] 05/31/2023, 10:32 AM

## 2023-05-31 NOTE — Progress Notes (Signed)
 Inpatient Rehabilitation Center Individual Statement of Services  Patient Name:  Carl Martis Sr.  Date:  05/31/2023  Welcome to the Inpatient Rehabilitation Center.  Our goal is to provide you with an individualized program based on your diagnosis and situation, designed to meet your specific needs.  With this comprehensive rehabilitation program, you will be expected to participate in at least 3 hours of rehabilitation therapies Monday-Friday, with modified therapy programming on the weekends.  Your rehabilitation program will include the following services:  Physical Therapy (PT), Occupational Therapy (OT), 24 hour per day rehabilitation nursing, Therapeutic Recreaction (TR), Neuropsychology, Care Coordinator, Rehabilitation Medicine, Nutrition Services, and Pharmacy Services  Weekly team conferences will be held on Tuesday to discuss your progress.  Your Inpatient Rehabilitation Care Coordinator will talk with you frequently to get your input and to update you on team discussions.  Team conferences with you and your family in attendance may also be held.  Expected length of stay: 4 weeks  Overall anticipated outcome: CGA-transfers-min-all else  Depending on your progress and recovery, your program may change. Your Inpatient Rehabilitation Care Coordinator will coordinate services and will keep you informed of any changes. Your Inpatient Rehabilitation Care Coordinator's name and contact numbers are listed  below.  The following services may also be recommended but are not provided by the Inpatient Rehabilitation Center:  Driving Evaluations Home Health Rehabiltiation Services Outpatient Rehabilitation Services    Arrangements will be made to provide these services after discharge if needed.  Arrangements include referral to agencies that provide these services.  Your insurance has been verified to be:  UHC Dual COmplete Your primary doctor is:  Diabas Koirala  Pertinent information  will be shared with your doctor and your insurance company.  Inpatient Rehabilitation Care Coordinator:  Rhoda Clement, [name] [phone] or ELIGAH BASQUES  Information discussed with and copy given to patient by: Clement Asberry MATSU, 05/31/2023, 10:34 AM

## 2023-05-31 NOTE — Plan of Care (Signed)
  Problem: Consults Goal: RH SPINAL CORD INJURY PATIENT EDUCATION Description:  See Patient Education module for education specifics.  Outcome: Progressing   Problem: SCI BOWEL ELIMINATION Goal: RH STG MANAGE BOWEL WITH ASSISTANCE Description: STG Manage Bowel with minimal Assistance. Outcome: Progressing Goal: RH STG SCI MANAGE BOWEL WITH MEDICATION WITH ASSISTANCE Description: STG SCI Manage bowel with medication with assistance. Outcome: Progressing   Problem: RH SKIN INTEGRITY Goal: RH STG SKIN FREE OF INFECTION/BREAKDOWN Description: Manage skin free of infection/breakdown with minimal assistance Outcome: Progressing   Problem: RH SAFETY Goal: RH STG ADHERE TO SAFETY PRECAUTIONS W/ASSISTANCE/DEVICE Description: STG Adhere to Safety Precautions With minimal Assistance/Device. Outcome: Progressing   Problem: RH PAIN MANAGEMENT Goal: RH STG PAIN MANAGED AT OR BELOW PT'S PAIN GOAL Description: Pain<4 w/ prns Outcome: Progressing   Problem: RH KNOWLEDGE DEFICIT SCI Goal: RH STG INCREASE KNOWLEDGE OF SELF CARE AFTER SCI Description: Manage increase knowledge of self care after SCI with minimal assistance using educational materials provided to son Outcome: Progressing

## 2023-05-31 NOTE — Progress Notes (Signed)
 Inpatient Rehabilitation  Patient information reviewed and entered into eRehab system by [facility]. Karen Kays., CCC/SLP, PPS Coordinator.  Information including medical coding, functional ability and quality indicators will be reviewed and updated thro

## 2023-05-31 NOTE — Progress Notes (Signed)
 Physical Therapy Assessment and Plan  Patient Details  Name: Carl Goethe [name]. MRN: [id number] Date of Birth: 24-Feb-1960  PT Diagnosis: Abnormality of gait, Difficulty walking, Impaired sensation, Muscle weakness, and Quadriplegia Rehab Potential: Good ELOS: 4 weeks   Today's Date: 05/31/2023 PT Individual Time: 4098-1191 PT Individual Time Calculation (min): 75 min    Hospital Problem: Principal Problem:   Spinal cord injury, cervical region, sequela (HCC)   Past Medical History:  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    Parathyroid  abnormality (HCC)    ectopic parathyroid  gland   Presence of arteriovenous fistula for hemodialysis, primary (HCC)    RUE PER PT RLE   Refusal of blood product    NO WHOLE BLOOD PROUCTS   Renal cell carcinoma (HCC)    s/p hand assisted laparoscopic bilateral nephrectomies 11/29/17, + RCC left   Secondary hyperparathyroidism (HCC)    Seizures (HCC)    one episode in past, due to" elevated Potassium" 08/02/20- "at least 4 years ago"   Sleep apnea    doesn't use CPAP anymore since weight loss   Stroke [facility])    no residual   Past Surgical History:  Past Surgical History:  Procedure Laterality Date   ANTERIOR CERVICAL CORPECTOMY N/A 05/25/2023   Procedure: ANTERIOR CERVICAL CORPECTOMY CERVICAL FOUR, CERVICAL THREE - CERVICAL FIVE FUSION;  Surgeon: [name], MD;  Location: [facility] OR;  Service: Neurosurgery;  Laterality:  N/A;   AV FISTULA PLACEMENT Right    right arm   BIOPSY  08/01/2019   Procedure: BIOPSY;  Surgeon: Tami Falcon, MD;  Location: [facility] ENDOSCOPY;  Service: Endoscopy;;   BIOPSY  11/18/2020   Procedure: BIOPSY;  Surgeon: Elois Hair, MD;  Location: [facility] ENDOSCOPY;  Service: Gastroenterology;;   BIOPSY  09/13/2021   Procedure: BIOPSY;  Surgeon: Normie Becton., MD;  Location: [facility] ENDOSCOPY;  Service: Gastroenterology;;   CATARACT EXTRACTION W/ INTRAOCULAR LENS  IMPLANT, BILATERAL     COLON SURGERY     COLONOSCOPY N/A 08/04/2015   Procedure: COLONOSCOPY;  Surgeon: Nannette Babe, MD;  Location: [facility] ENDOSCOPY;  Service: Endoscopy;  Laterality: N/A;   COLONOSCOPY  2017   JMP@ [facility]-good prep-mass -recall 1 yr   COLONOSCOPY N/A 09/13/2021   Procedure: COLONOSCOPY;  Surgeon: Mansouraty, Albino Alu., MD;  Location: [facility] ENDOSCOPY;  Service: Gastroenterology;  Laterality: N/A;   COLONOSCOPY WITH PROPOFOL  N/A 11/25/2020   Procedure: COLONOSCOPY WITH PROPOFOL ;  Surgeon: Daina Drum, MD;  Location: [facility] ENDOSCOPY;  Service: Gastroenterology;  Laterality: N/A;   COLONOSCOPY WITH PROPOFOL  N/A 09/23/2021   Procedure: COLONOSCOPY WITH PROPOFOL ;  Surgeon: Annis Kinder, DO;  Location: [facility] ENDOSCOPY;  Service: Gastroenterology;  Laterality: N/A;   ENTEROSCOPY N/A 09/23/2021   Procedure: ENTEROSCOPY;  Surgeon: Annis Kinder, DO;  Location:  [facility] ENDOSCOPY;  Service: Gastroenterology;  Laterality: N/A;  Will place order for video capsule study as we may opt to place it during procedure   ESOPHAGOGASTRODUODENOSCOPY N/A 08/01/2019   Procedure: ESOPHAGOGASTRODUODENOSCOPY (EGD);  Surgeon: Tami Falcon, MD;  Location: [facility] ENDOSCOPY;  Service: Endoscopy;  Laterality: N/A;   ESOPHAGOGASTRODUODENOSCOPY N/A 11/18/2020   Procedure: ESOPHAGOGASTRODUODENOSCOPY (EGD);  Surgeon: Elois Hair, MD;  Location: [facility] ENDOSCOPY;  Service: Gastroenterology;  Laterality: N/A;   ESOPHAGOGASTRODUODENOSCOPY (EGD) WITH PROPOFOL   N/A 08/04/2019   Procedure: ESOPHAGOGASTRODUODENOSCOPY (EGD) WITH PROPOFOL ;  Surgeon: Ace Holder, MD;  Location: [facility] ENDOSCOPY;  Service: Gastroenterology;  Laterality: N/A;   ESOPHAGOGASTRODUODENOSCOPY (EGD) WITH PROPOFOL  N/A 09/13/2021   Procedure: ESOPHAGOGASTRODUODENOSCOPY (EGD) WITH PROPOFOL ;  Surgeon: Brice Campi Albino Alu., MD;  Location: [facility] ENDOSCOPY;  Service: Gastroenterology;  Laterality: N/A;   GIVENS CAPSULE STUDY N/A 09/23/2021   Procedure: GIVENS CAPSULE STUDY;  Surgeon: Annis Kinder, DO;  Location: [facility] ENDOSCOPY;  Service: Gastroenterology;  Laterality: N/A;   graft left arm Left    for dialysis x 2. Removed   HOT HEMOSTASIS  11/18/2020   Procedure: HOT HEMOSTASIS (ARGON PLASMA COAGULATION/BICAP);  Surgeon: Elois Hair, MD;  Location: [facility] ENDOSCOPY;  Service: Gastroenterology;;   HOT HEMOSTASIS N/A 09/23/2021   Procedure: HOT HEMOSTASIS (ARGON PLASMA COAGULATION/BICAP);  Surgeon: Annis Kinder, DO;  Location: [facility] ENDOSCOPY;  Service: Gastroenterology;  Laterality: N/A;   INSERTION OF DIALYSIS CATHETER     Rt chest   LAPAROSCOPIC RIGHT COLECTOMY N/A 08/05/2015   Procedure: LAPAROSCOPIC RIGHT COLECTOMY- ASCENDING;  Surgeon: Lockie Rima, MD;  Location: [facility] OR;  Service: General;  Laterality: N/A;   MASS EXCISION Left 05/28/2019   Procedure: EXCISION SOFT TISSUE MASS LEFT SHOULDER;  Surgeon: Oralee Billow, MD;  Location: [facility] ORS;  Service: General;  Laterality: Left;   NEPHRECTOMY Bilateral    PARATHYROIDECTOMY N/A 06/12/2016   Procedure: TOTAL PARATHYROIDECTOMY WITH AUTOTRANSPLANTATION TO LEFT FOREARM;  Surgeon: Oralee Billow, MD;  Location: [facility] OR;  Service: General;  Laterality: N/A;   PARATHYROIDECTOMY N/A 10/23/2016   Procedure: PARATHYROIDECTOMY;  Surgeon: Oralee Billow, MD;  Location: [facility] OR;  Service: General;  Laterality: N/A;   REVERSE SHOULDER ARTHROPLASTY Right 08/24/2020   Procedure: REVERSE SHOULDER ARTHROPLASTY;  Surgeon: Micheline Ahr, MD;  Location:  [facility] OR;  Service: Orthopedics;  Laterality: Right;   REVISON OF ARTERIOVENOUS FISTULA Right 07/16/2017   Procedure: REVISION OF ARTERIOVENOUS FISTULA  Right ARM;  Surgeon: Adine Hoof, MD;  Location: [facility] OR;  Service: Vascular;  Laterality: Right;   TOTAL HIP ARTHROPLASTY Left 11/14/2020   Procedure: LEFT TOTAL HIP ARTHROPLASTY ANTERIOR APPROACH;  Surgeon: Wes Hamman, MD;  Location: [facility] OR;  Service: Orthopedics;  Laterality: Left;   UPPER GASTROINTESTINAL ENDOSCOPY  2021   @ [facility]    Assessment & Plan Clinical Impression: [name] is a 63 year old male (Jehov[employer]'s [employer] but has accepted blood products in the past)  with history of ESRD-HD MWF, duodenitis/gastritis w/recurrent GIB, GERD, seizure, HTN, OSA, RCC s/p B-lap nephrectomies, cerebral venous stenosis, CVA w/o residual deficits, chronic HA who has had progressive numbness with pain BUE, right foot drop X 2 weeks and balance issues who fell due to knees giving away and struck his face. He was unable to lift his legs and had weakness of BUE and presented to ED for evaluation on 05/25/23. MRI brain showed chronic L-PCA stroke iwht large left mastoid and middle ear effusion. CT C spine showed subacute C4  burst fracture  with severe canal stenosis superimposed on  C4/5 & C5-6  advanced spondylosis with cord edema and/or evolving myelomalacia C3-C6. He was evaluated by Dr. Adonis Alamin, found to have tetraplegia BUE>BLE and was taken to OR emergently  for C4 anterior corpectomy with C3-C5 anterior fusion on 05/26/23 am. Post procedure HD ongoing and home gabapentin  resumed. PT/OT  has been working with patient who is showing improvement in balance at EOB with close guard assist for safety and posterior lean. He requires +2 assist with Stedy for mobility and ADLs.    He was independent and driving PTA but having difficulty with RLE and RUE so was planning on stopping. CIR recommended due to functional decline.   Patient transferred to CIR  on 05/30/2023 .   Patient currently requires max with mobility secondary to muscle weakness, impaired timing and sequencing and unbalanced muscle activation, and decreased standing balance, decreased postural control, decreased balance strategies, and difficulty maintaining precautions.  Prior to hospitalization, patient was independent  with mobility and lived with Son in a Apartment home.  Home access is 2nd floor apartment; ~18 stairsStairs to enter.  Patient will benefit from skilled PT intervention to maximize safe functional mobility, minimize fall risk, and decrease caregiver burden for planned discharge home with 24 hour assist.  Anticipate patient will benefit from follow up [facility] at discharge.  PT - End of Session Activity Tolerance: Tolerates 10 - 20 min activity with multiple rests Endurance Deficit: Yes Endurance Deficit Description: fatigues quickly with transfers PT Assessment Rehab Potential (ACUTE/IP ONLY): Good PT Barriers to Discharge: Inaccessible home environment;Decreased caregiver support;Home environment access/layout PT Patient demonstrates impairments in the following area(s): Balance;Skin Integrity PT Transfers Functional Problem(s): Bed Mobility;Bed to Chair;Car PT Locomotion Functional Problem(s): Ambulation;Wheelchair Mobility;Stairs PT Plan PT Intensity: Minimum of 1-2 x/day ,45 to 90 minutes PT Frequency: 5 out of 7 days PT Duration Estimated Length of Stay: 4 weeks PT Treatment/Interventions: Ambulation/gait training;Cognitive remediation/compensation;Discharge planning;DME/adaptive equipment instruction;Functional mobility training;Pain management;Psychosocial support;Therapeutic Activities;Splinting/orthotics;UE/LE Strength taining/ROM;Visual/perceptual remediation/compensation;Wheelchair Designer, jewellery;Therapeutic Exercise;Skin care/wound management;Patient/family education;Neuromuscular re-education;Disease  management/prevention;Balance/vestibular training;Community reintegration;Functional electrical stimulation PT Transfers Anticipated Outcome(s): CGA with LRAD PT Locomotion Anticipated Outcome(s): min a with LRAD, min a stairs PT Recommendation Recommendations for Other Services: None Follow Up Recommendations: Home health PT Patient destination: Home Equipment Recommended: To be determined   PT Evaluation Precautions/Restrictions Precautions Precautions: Fall;Cervical Recall of Precautions/Restrictions: Impaired Precaution/Restrictions Comments: Verbally reviewed cervical precautions. Required Braces or Orthoses: Cervical Brace Cervical Brace: Hard collar Restrictions Weight Bearing Restrictions Per Provider Order: No General   Vital Signs  Pain Pain Assessment Pain Score: 0-No pain Pain Interference Pain Interference Pain Effect on Sleep: 3. Frequently Pain Interference with Therapy Activities: 2. Occasionally Pain Interference with Day-to-Day Activities: 2. Occasionally Home Living/Prior Functioning Home Living Living Arrangements: Children Available Help at Discharge: Family;Available 24 hours/day Type of Home: Apartment Home Access: Stairs to enter [employer] of [employer]: 2nd floor apartment; ~18 stairs Entrance Stairs-Rails: Right;Left;Can reach both Home Layout: One level  Lives With: Son Prior Function Level of Independence: Independent with gait;Independent with transfers;Needs assistance with gait (assistance with stairs)  Able to Take Stairs?: Yes (with assistance) Driving: Yes Vocation: Retired Vision/Perception  Vision - History Ability to See in Adequate Light: 1 Impaired Perception Perception: Within Functional Limits Praxis Praxis: WFL  Cognition Overall Cognitive Status: No family/caregiver present to determine baseline cognitive functioning Orientation Level: Oriented X4 Day of Week: Correct Memory: Appears intact Awareness: Appears  intact Problem Solving: Appears intact Safety/Judgment: Appears intact Sensation  Sensation Light Touch: Impaired Detail Peripheral sensation comments: impaired BLE, distal>proximal, R>L Coordination Gross Motor Movements are Fluid and Coordinated: No Coordination and Movement Description: grossly dyscoordinated d/t new weakness Motor  Motor Motor: (P) Tetraplegia   Trunk/Postural Assessment  Cervical Assessment Cervical Assessment: Exceptions to [facility] (cervical precautions) Thoracic Assessment Thoracic Assessment: Within Functional Limits Lumbar Assessment Lumbar Assessment: Within Functional Limits Postural Control Postural Control: Deficits on evaluation  Balance Balance Balance Assessed: Yes Static Sitting Balance Static Sitting - Balance Support: Feet supported Static Sitting - Level of Assistance: 5: Stand by assistance Dynamic Sitting Balance Dynamic Sitting - Balance Support: Feet supported;During functional activity Dynamic Sitting - Level of Assistance: 4: Min assist Static Standing Balance Static Standing - Balance Support: During functional activity;Bilateral upper extremity supported Static Standing - Level of Assistance: 4: Min assist Dynamic Standing Balance Dynamic Standing - Balance Support: During functional activity Dynamic Standing - Level of Assistance: 1: +2 Total assist Extremity Assessment  RUE Assessment RUE Assessment: Within Functional Limits Active Range of Motion (AROM) Comments: History of shoulder surgery (Limited ROM PTA); ~100 degrees of elbow extension, WFL for elbow flexion; non-functional digital extension/flexion General Strength Comments: 2+/5 LUE Assessment LUE Assessment: Exceptions to [facility] Active Range of Motion (AROM) Comments: ~90 degrees of shoulder abduction; WFL for elbow flexio/extension; [facility] for digital flexion/extension, weak grasp, dysmetria General Strength Comments: 2+/5 RLE Assessment RLE Assessment: Exceptions to  [facility] General Strength Comments: grossly 3+/5 LLE Assessment LLE Assessment: Exceptions to [facility] General Strength Comments: grossly 4/5, pt reports near baseline but still diminished  Care Tool Care Tool Bed Mobility Roll left and right activity   Roll left and right assist level: Supervision/Verbal cueing    Sit to lying activity   Sit to lying assist level: Supervision/Verbal cueing    Lying to sitting on side of bed activity   Lying to sitting on side of bed assist level: the ability to move from lying on the back to sitting on the side of the bed with no back support.: Supervision/Verbal cueing     Care Tool Transfers Sit to stand transfer   Sit to stand assist level: 2 Helpers    Chair/bed transfer   Chair/bed transfer assist level: 2 Web designer transfer activity did not occur: Safety/medical concerns        Care Tool Locomotion Ambulation Ambulation activity did not occur: Safety/medical concerns        Walk 10 feet activity Walk 10 feet activity did not occur: Safety/medical concerns       Walk 50 feet with 2 turns activity Walk 50 feet with 2 turns activity did not occur: Safety/medical concerns      Walk 150 feet activity Walk 150 feet activity did not occur: Safety/medical concerns      Walk 10 feet on uneven surfaces activity Walk 10 feet on uneven surfaces activity did not occur: Safety/medical concerns      Stairs Stair activity did not occur: Safety/medical concerns        Walk up/down 1 step activity Walk up/down 1 step or curb (drop down) activity did not occur: Safety/medical concerns      Walk up/down 4 steps activity Walk up/down 4 steps activity did not occur: Safety/medical concerns      Walk up/down 12 steps activity Walk up/down 12 steps activity did not occur: Safety/medical concerns      Pick up small objects from floor Pick up small object from the floor (from standing position)  activity did not occur: Safety/medical  concerns      Wheelchair Is the patient using a wheelchair?: Yes Type of Wheelchair: Manual   Wheelchair assist level: Dependent - Patient 0% Max wheelchair distance: 150 ft  Wheel 50 feet with 2 turns activity   Assist Level: Dependent - Patient 0%  Wheel 150 feet activity   Assist Level: Dependent - Patient 0%    Refer to Care Plan for Long Term Goals  SHORT TERM GOAL WEEK 1 PT Short Term Goal 1 (Week 1): pt will perform STS with RW with assist of 1 PT Short Term Goal 2 (Week 1): Pt will perform stand pivot with LRAD with assist of 1 PT Short Term Goal 3 (Week 1): pt will initiate gait training  Recommendations for other services: None   Skilled Therapeutic Intervention Pt received in recliner and agreeable to therapy. Pt w/o c/o pain. Evaluation completed (see details above) with patient education regarding purpose of PT evaluation, PT POC and goals, therapy schedule, weekly team meetings, and other CIR information including safety plan and fall risk safety.  Pt performed the below functional mobility tasks with the specified levels of skilled cuing and assistance. Pt was able to perform Sit to stand and Stand pivot transfer with RW with mod-max a x 2. With stedy, max a x1. Performed x 5 Sit to stand from stedy perch with max fading to min with repetition and cueing. Adjusted TIS to better fit pt and pt was left with all needs in reach and alarm active.   Mobility Bed Mobility Bed Mobility: Supine to Sit;Sit to Supine Supine to Sit: Supervision/Verbal cueing Sit to Supine: Supervision/Verbal cueing Transfers Transfers: Sit to Stand Sit to Stand: 2 Helpers Transfer (Assistive device): Rolling walker Locomotion  Gait Ambulation: No Stairs / Additional Locomotion Stairs: No Wheelchair Mobility Wheelchair Mobility: No   Discharge Criteria: Patient will be discharged from PT if patient refuses treatment 3 consecutive times without medical reason, if treatment goals not met,  if there is a change in medical status, if patient makes no progress towards goals or if patient is discharged from hospital.  The above assessment, treatment plan, treatment alternatives and goals were discussed and mutually agreed upon: by patient  Tex Filbert 05/31/2023, 12:49 PM

## 2023-06-01 DIAGNOSIS — S14109S Unspecified injury at unspecified level of cervical spinal cord, sequela: Secondary | ICD-10-CM | POA: Diagnosis not present

## 2023-06-01 NOTE — Progress Notes (Signed)
 PROGRESS NOTE   Subjective/Complaints: Pt reports canot complain, however LBM was last night around 10pm- has control of stool, per pt. Getting miralax  3x/day.    Denies muscle spasticity Is anuric- goes to HD- they couldn't do yesterday so took today- so no therapy.   Pain 6-7/10 after meds.    ROS:  Pt denies SOB, abd pain, CP, N/V/C/D, and vision changes   Objective:   No results found.  Recent Labs    05/29/23 2155  WBC 8.2  HGB 9.3*  HCT 29.5*  PLT 266   Recent Labs    05/29/23 2150  NA 130*  K 5.5*  CL 92*  CO2 24  GLUCOSE 108*  BUN 47*  CREATININE 9.36*  CALCIUM  8.8*    Intake/Output Summary (Last 24 hours) at 06/01/2023 1900 Last data filed at 06/01/2023 1751 Gross per 24 hour  Intake 840 ml  Output --  Net 840 ml        Physical Exam: Vital Signs Blood pressure (!) 143/84, pulse 90, temperature 97.6 F (36.4 C), temperature source Oral, resp. rate 18, height 6\' 1"  (1.854 m), weight 99.1 kg, SpO2 100%.   General: awake, alert, appropriate, on a low air loss mattress; NAD HENT: conjugate gaze; oropharynx moist; cervical collar- honeycomb dressing intact CV: regular rate and rhythm- rate in 90's; no JVD Pulmonary: CTA B/L; no W/R/R- good air movement GI: soft, NT, ND, (+)BS- normoactive Psychiatric: appropriate Neurological: Ox3   Skin: C/D/I. Multiple lesions on BL legs with various stages of healing.  + RUE fistula - intact   MSK:      LLE knee with palpable effusion, no warmth, no ttp Mood: Pleasant affect, appropriate mood.  Neurologic exam: Alert and oriented x 4, fluent, follows commands, cranial nerves II through XII grossly intact, no hypertonia noted  Sensation: Reduced to light touch in distal right lower extremity to the mid-thigh       Strength:                RUE: 2/5 SA, 2/5 EF, 2/5 EE, 2/5 WE, 2/5 FF, 2/5 FA                LUE:  3/5 SA, 3/5 EF, 3/5 EE, 3/5 WE, 3/5  FF, 3/5 FA                RLE: 2/5 HF, 2/5 KE, 2/5  DF, 2/5  EHL, 2/5  PF                 LLE:  3/5 HF, 4/5 KE, 4/5  DF, 4/5  EHL, 4/5  PF  Neuroexam stable 4/18  Assessment/Plan: 1. Functional deficits which require 3+ hours per day of interdisciplinary therapy in a comprehensive inpatient rehab setting. Physiatrist is providing close team supervision and 24 hour management of active medical problems listed below. Physiatrist and rehab team continue to assess barriers to discharge/monitor patient progress toward functional and medical goals  Care Tool:  Bathing    Body parts bathed by patient: Right arm, Chest, Abdomen, Right upper leg, Left upper leg, Face   Body parts bathed by helper: Left arm, Front perineal area, Buttocks,  Right lower leg, Left lower leg     Bathing assist Assist Level: Moderate Assistance - Patient 50 - 74%     Upper Body Dressing/Undressing Upper body dressing   What is the patient wearing?: Pull over shirt    Upper body assist Assist Level: Moderate Assistance - Patient 50 - 74%    Lower Body Dressing/Undressing Lower body dressing      What is the patient wearing?: Pants     Lower body assist Assist for lower body dressing: Maximal Assistance - Patient 25 - 49%     Toileting Toileting Toileting Activity did not occur (Clothing management and hygiene only): N/A (no void or bm) (anuric, HD patient)  Toileting assist Assist for toileting: Maximal Assistance - Patient 25 - 49%     Transfers Chair/bed transfer  Transfers assist     Chair/bed transfer assist level: 2 Helpers     Locomotion Ambulation   Ambulation assist   Ambulation activity did not occur: Safety/medical concerns          Walk 10 feet activity   Assist  Walk 10 feet activity did not occur: Safety/medical concerns        Walk 50 feet activity   Assist Walk 50 feet with 2 turns activity did not occur: Safety/medical concerns         Walk 150 feet  activity   Assist Walk 150 feet activity did not occur: Safety/medical concerns         Walk 10 feet on uneven surface  activity   Assist Walk 10 feet on uneven surfaces activity did not occur: Safety/medical concerns         Wheelchair     Assist Is the patient using a wheelchair?: Yes Type of Wheelchair: Manual    Wheelchair assist level: Dependent - Patient 0% Max wheelchair distance: 150 ft    Wheelchair 50 feet with 2 turns activity    Assist        Assist Level: Dependent - Patient 0%   Wheelchair 150 feet activity     Assist      Assist Level: Dependent - Patient 0%   Blood pressure (!) 143/84, pulse 90, temperature 97.6 F (36.4 C), temperature source Oral, resp. rate 18, height 6\' 1"  (1.854 m), weight 99.1 kg, SpO2 100%.  Medical Problem List and Plan: 1. 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  Antithrombotics: -DVT/anticoagulation:  Mechanical: Sequential compression devices, below knee Bilateral lower extremities             -antiplatelet therapy: N/A--no antiplatelet due to hx of recurrent GIB 3. Pain Management:  oxycodone  prn. Gabapentin  300 mg/hs.              --has Flexeril  10 mg TID prn              - patient with intermittent, severe b/l headache - add topomax 25 mg BID    4/19-  pt rating pain 07/19/08 which is "tolerable" 4. Mood/Behavior/Sleep: LCSW to follow for evaluation and support             -antipsychotic agents: N/A             --melatonin prn for insomnia.  5. Neuropsych/cognition: This patient is capable of making decisions on his own behalf. 6. Skin/Wound  Care: Routine pressure relief measures.   4/19- due to ulcers on legs, on low air loss mattress 7. Fluids/Electrolytes/Nutrition: Strict I/O. Daily weights. Labs with HD. 8. ESRD- HD MWF: Schedule HD at the end of the day to help with tolerance of therapy             --Sensipar  and Renvela  for metabolic bone disease.   - Patient reports no longer makes urine 9. Anemia of chronic disease: Monitor H/H 10. H/o recurrent GIB/Jehovah's Witness: Continue Protonix  BID 11. H/o Hypotension: Has had drop  in BP with HD. Now cervical SCI.  --Monitor for symptoms day after HD and with activity.  -4/18 baseline blood pressure stable, monitor with activity 4/19- BP actually slightly elevated today- 140s-150's- con't regimen, since can drop with HD and therapy    06/01/2023    1:12 PM 06/01/2023    6:54 AM 06/01/2023    5:21 AM  Vitals with BMI  Weight  218 lbs 8 oz   BMI  28.83   Systolic 143  150  Diastolic 84  90  Pulse 90  86    12. Constipation: Small BM on 04/14 --had of nulytey yesterday and another today?  -Xray 4/17 without excessive stool, patient reports stool still little hard yesterday.  Will increase MiraLAX  to 3 times a day, continue Senokot twice a day. Suppository PRN   4/19- had stool last night- large soft stool 13. ?left knee effusion. Not currently bothersome. Consider imaging.    I spent a total of 41   minutes on total care today- >50% coordination of care- due to  Reviewing chart, getting to know pt- reviewing last labs, vitals and stools.     LOS: 2 days A FACE TO FACE EVALUATION WAS PERFORMED  Natascha Edmonds 06/01/2023, 7:00 PM

## 2023-06-01 NOTE — Progress Notes (Signed)
  [facility] Progress Note   Subjective:   Patient seen in room. He has a visitor today when I dropped by. He reports that he is doing well with no complaints or concerns. Wearing C-spine and has honeycomb dressing on neck. No reported dyspnea or CP. Plan for HD today 06/01/23.  Objective Vitals:   06/01/23 0500 06/01/23 0521 06/01/23 0654 06/01/23 1312  BP:  (!) 150/90  (!) 143/84  Pulse:  86  90  Resp:  18  18  Temp:  97.6 F (36.4 C)  97.6 F (36.4 C)  TempSrc:  Oral  Oral  SpO2:  100%  100%  Weight: 99.2 kg  99.1 kg   Height:       Physical Exam General: Pleasant AA male, no acute distress, in C-spine collar with honeycomb dressing Heart: RRR, no murmur, gallops, or rubs Lungs: CTA bilaterally, no wheeze or rales  Abdomen: soft, NT, ND, +b/s Extremities: No bruising or lesions, No LEE, euvolemic on exam Dialysis Access:  RFA AVF +t/b  Additional Objective Labs: Basic Metabolic Panel: Recent Labs  Lab 05/25/23 1830 05/25/23 1933 05/27/23 0921 05/29/23 2150  NA 140 140 136 130*  K 3.8 3.8 4.5 5.5*  CL 96* 97* 94* 92*  CO2 32  --  27 24  GLUCOSE 107* 89 95 108*  BUN 35* 36* 54* 47*  CREATININE 7.18* 7.60* 9.65* 9.36*  CALCIUM  9.8  --  9.1 8.8*  PHOS  --   --   --  6.2*   Liver Function Tests: Recent Labs  Lab 05/25/23 1830 05/27/23 0921 05/29/23 2150  AST 21 15  --   ALT 13 6  --   ALKPHOS 70 71  --   BILITOT 0.6 0.5  --   PROT 7.4 6.9  --   ALBUMIN  3.2* 2.8* 2.9*    CBC: Recent Labs  Lab 05/25/23 1830 05/25/23 1933 05/27/23 0921 05/29/23 2155  WBC 6.4  --  7.3 8.2  NEUTROABS 4.4  --   --   --   HGB 9.9* 10.5* 8.4* 9.3*  HCT 31.8* 31.0* 26.1* 29.5*  MCV 92.2  --  90.3 90.2  PLT 203  --  199 266   Blood Culture    Component Value Date/Time   SDES BLOOD LEFT HAND 08/01/2015 0624   SPECREQUEST BOTTLES DRAWN AEROBIC AND ANAEROBIC  5CC 08/01/2015 0624   CULT NO GROWTH 5 DAYS 08/01/2015 0624   REPTSTATUS 08/06/2015 FINAL  08/01/2015 0624    Cardiac Enzymes: No results for input(s): "CKTOTAL", "CKMB", "CKMBINDEX", "TROPONINI" in the last 168 hours. CBG: Recent Labs  Lab 05/25/23 1904 05/26/23 0344  GLUCAP 81 90   Iron Studies: No results for input(s): "IRON", "TIBC", "TRANSFERRIN", "FERRITIN" in the last 72 hours. @lablastinr3 @ Studies/Results: DG Abd 1 View Result Date: 05/30/2023 CLINICAL DATA:  Constipation EXAM: ABDOMEN - 1 VIEW COMPARISON:  None Available. FINDINGS: The bowel gas pattern is normal. No radio-opaque calculi or other significant radiographic abnormality are seen. No excessive stool burden. Prior left hip replacement. Visualized lung bases clear. IMPRESSION: No acute findings. Electronically Signed   By: Janeece Mechanic M.D.   On: 05/30/2023 21:08   Medications:  anticoagulant sodium citrate       (feeding supplement) PROSource Plus  30 mL Oral BID BM   [START ON 06/06/2023] atorvastatin   10 mg Oral Q Thu   carbamide peroxide  5 drop Right EAR BID   Chlorhexidine  Gluconate Cloth  6 each Topical Q0600   Chlorhexidine   Gluconate Cloth  6 each Topical Q0600   cinacalcet   120 mg Oral Q supper   gabapentin   300 mg Oral QHS   heparin   3,000 Units Dialysis Once in dialysis   pantoprazole   40 mg Oral BID AC   polyethylene glycol  17 g Oral TID   senna-docusate  2 tablet Oral BID   sevelamer  carbonate  3,200 mg Oral TID WC   topiramate   25 mg Oral BID   OP HD: [facility] MWF 4h  B400   85.5kg   2K bath  AVF Heparin  3000 Mircera 60mcg IV q 2 weeks- last dose 05/24/23 Calcitriol  1.5mcg PO q HD   Renal -related home meds: Sensipar  120 mg at bedtime Lokelma  10 g daily Rena-Vite 1 daily Renvela  3-4 AC 3 times daily Others: Carafate  1 g twice daily as needed, PPI, Percocet, Neurontin , eyedrops, amoxicillin , Lipitor   Assessment/Plan: 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  Patient in rehab.  ESRD: HD MWF. Next HD today 06/01/23.  HTN: BP's  a bit high 143/84 today but is scheduled to get dialysis today  Volume: stable, euvolemic on exam.  Anemia: Hgb 8- 10.5, ESA given 05/24/23 Metabolic bone disease: Corrected Ca 9.7, holding VDRA for now. Phos 6.2 on Sevelamer  binder  Nutrition:  On renal diet. Alb 2.9. Adding prosource supplement     Hersey Lorenzo, AGNP 06/01/2023, 5:01 PM  [employer]

## 2023-06-01 NOTE — Plan of Care (Signed)
  Problem: Consults Goal: RH SPINAL CORD INJURY PATIENT EDUCATION Description:  See Patient Education module for education specifics.  Outcome: Progressing   Problem: SCI BOWEL ELIMINATION Goal: RH STG MANAGE BOWEL WITH ASSISTANCE Description: STG Manage Bowel with minimal Assistance. Outcome: Progressing Goal: RH STG SCI MANAGE BOWEL WITH MEDICATION WITH ASSISTANCE Description: STG SCI Manage bowel with medication with assistance. Outcome: Progressing   Problem: RH SKIN INTEGRITY Goal: RH STG SKIN FREE OF INFECTION/BREAKDOWN Description: Manage skin free of infection/breakdown with minimal assistance Outcome: Progressing   Problem: RH SAFETY Goal: RH STG ADHERE TO SAFETY PRECAUTIONS W/ASSISTANCE/DEVICE Description: STG Adhere to Safety Precautions With minimal Assistance/Device. Outcome: Progressing   Problem: RH KNOWLEDGE DEFICIT SCI Goal: RH STG INCREASE KNOWLEDGE OF SELF CARE AFTER SCI Description: Manage increase knowledge of self care after SCI with minimal assistance using educational materials provided to son Outcome: Progressing

## 2023-06-02 ENCOUNTER — Inpatient Hospital Stay (HOSPITAL_COMMUNITY)

## 2023-06-02 DIAGNOSIS — S14109S Unspecified injury at unspecified level of cervical spinal cord, sequela: Secondary | ICD-10-CM | POA: Diagnosis not present

## 2023-06-02 LAB — CBC
HCT: 27.1 % — ABNORMAL LOW (ref 39.0–52.0)
Hemoglobin: 8.8 g/dL — ABNORMAL LOW (ref 13.0–17.0)
MCH: 29.2 pg (ref 26.0–34.0)
MCHC: 32.5 g/dL (ref 30.0–36.0)
MCV: 90 fL (ref 80.0–100.0)
Platelets: 308 10*3/uL (ref 150–400)
RBC: 3.01 MIL/uL — ABNORMAL LOW (ref 4.22–5.81)
RDW: 15.4 % (ref 11.5–15.5)
WBC: 7 10*3/uL (ref 4.0–10.5)
nRBC: 0 % (ref 0.0–0.2)

## 2023-06-02 LAB — RENAL FUNCTION PANEL
Albumin: 2.4 g/dL — ABNORMAL LOW (ref 3.5–5.0)
Anion gap: 15 (ref 5–15)
BUN: 65 mg/dL — ABNORMAL HIGH (ref 8–23)
CO2: 24 mmol/L (ref 22–32)
Calcium: 7.6 mg/dL — ABNORMAL LOW (ref 8.9–10.3)
Chloride: 90 mmol/L — ABNORMAL LOW (ref 98–111)
Creatinine, Ser: 10.62 mg/dL — ABNORMAL HIGH (ref 0.61–1.24)
GFR, Estimated: 5 mL/min — ABNORMAL LOW (ref >60)
Glucose, Bld: 104 mg/dL — ABNORMAL HIGH (ref 70–99)
Phosphorus: 5.8 mg/dL — ABNORMAL HIGH (ref 2.5–4.6)
Potassium: 5.2 mmol/L — ABNORMAL HIGH (ref 3.5–5.1)
Sodium: 129 mmol/L — ABNORMAL LOW (ref 135–145)

## 2023-06-02 LAB — LACTIC ACID, PLASMA
Lactic Acid, Venous: 1.4 mmol/L (ref 0.5–1.9)
Lactic Acid, Venous: 1.2 mmol/L (ref 0.5–1.9)

## 2023-06-02 LAB — PROCALCITONIN: Procalcitonin: 0.55 ng/mL

## 2023-06-02 MED ORDER — VANCOMYCIN HCL 2000 MG/400ML IV SOLN
2000.0000 mg | Freq: Once | INTRAVENOUS | Status: AC
  Administered 2023-06-02: 2000 mg via INTRAVENOUS
  Filled 2023-06-02: qty 400

## 2023-06-02 MED ORDER — CARVEDILOL 3.125 MG PO TABS
3.1250 mg | ORAL_TABLET | Freq: Once | ORAL | Status: DC
  Administered 2023-06-02: 3.125 mg via ORAL
  Filled 2023-06-02: qty 1

## 2023-06-02 MED ORDER — CALCITRIOL 0.5 MCG PO CAPS
1.5000 ug | ORAL_CAPSULE | Freq: Once | ORAL | Status: DC
  Filled 2023-06-02: qty 3

## 2023-06-02 MED ORDER — MIDODRINE HCL 5 MG PO TABS
10.0000 mg | ORAL_TABLET | Freq: Once | ORAL | Status: DC
Start: 2023-06-03 — End: 2023-06-10

## 2023-06-02 MED ORDER — SODIUM CHLORIDE 0.9 % IV BOLUS
250.0000 mL | Freq: Once | INTRAVENOUS | Status: AC
  Administered 2023-06-02: 250 mL via INTRAVENOUS

## 2023-06-02 MED ORDER — VANCOMYCIN HCL IN DEXTROSE 1-5 GM/200ML-% IV SOLN
1000.0000 mg | INTRAVENOUS | Status: DC
  Filled 2023-06-02: qty 200

## 2023-06-02 MED ORDER — SODIUM CHLORIDE 0.9 % IV SOLN
1.0000 g | INTRAVENOUS | Status: DC
  Administered 2023-06-02 – 2023-06-06 (×5): 1 g via INTRAVENOUS
  Filled 2023-06-02 (×6): qty 10

## 2023-06-02 MED ORDER — METOPROLOL TARTRATE 12.5 MG HALF TABLET: 12.5000 mg | ORAL_TABLET | Freq: Two times a day (BID) | ORAL | Status: DC | PRN

## 2023-06-02 MED ORDER — SODIUM CHLORIDE 0.9 % IV BOLUS
250.0000 mL | Freq: Once | INTRAVENOUS | Status: AC | PRN
  Administered 2023-06-02: 250 mL via INTRAVENOUS

## 2023-06-02 NOTE — Progress Notes (Signed)
 Physical Therapy Session Note  Patient Details  Name: Carl Carneal Sr. MRN: [id number] Date of Birth: [birth year]  Today's Date: 06/02/2023 PT Individual Time: 0900-0900 PT Individual Time Calculation (min): 0 min   Short Term Goals: Week 1:  PT Short Term Goal 1 (Week 1): pt will perform STS with RW with assist of 1 PT Short Term Goal 2 (Week 1): Pt will perform stand pivot with LRAD with assist of 1 PT Short Term Goal 3 (Week 1): pt will initiate gait training  Skilled Therapeutic Interventions/Progress Updates:  Attempted to see pt in the am for scheduled time 900 to 1000, however pt unable to be seen, pt off unit for dialysis.   Therapy Documentation Precautions:  Precautions Precautions: Fall, Cervical, Other (comment) (HD MWF (RUE restricted).) Recall of Precautions/Restrictions: Impaired Precaution/Restrictions Comments: Verbally reviewed cervical precautions. Required Braces or Orthoses: Cervical Brace Cervical Brace: Hard collar Restrictions Weight Bearing Restrictions Per Provider Order: No General: PT Amount of Missed Time (min): 60 Minutes PT Missed Treatment Reason: Other (Comment) (pt off unit receiving dialysis)   [name] 06/02/2023, 12:59 PM

## 2023-06-02 NOTE — Progress Notes (Addendum)
 PROGRESS NOTE   Subjective/Complaints:  Pt reports LBM 4/18-at night  He also is very upset that HD was scheduled Friday, but wasn't taken Friday nor Saturday and then per pt, was told would go at 6:30 this AM, however was taken around 8:30-9am it appears.   Pt very frustrated about this ROS:  Pt denies SOB, abd pain, CP, N/V/C/D, and vision changes   Objective:   No results found.  Recent Labs    06/02/23 0840  WBC 7.0  HGB 8.8*  HCT 27.1*  PLT 308   Recent Labs    06/02/23 0840  NA 129*  K 5.2*  CL 90*  CO2 24  GLUCOSE 104*  BUN 65*  CREATININE 10.62*  CALCIUM  7.6*    Intake/Output Summary (Last 24 hours) at 06/02/2023 1041 Last data filed at 06/02/2023 0702 Gross per 24 hour  Intake 980 ml  Output --  Net 980 ml        Physical Exam: Vital Signs Blood pressure 108/76, pulse (!) 106, temperature 98.6 F (37 C), temperature source Oral, resp. rate 13, height 6\' 1"  (1.854 m), weight 106.5 kg, SpO2 99%.    General: awake, alert, extremely frustrated and upset about HD- cannot get him to discuss any other topic; NAD HENT: conjugate gaze; oropharynx moist CV: regular rhythm; tachycardic rate in 100's to 110s; no JVD Pulmonary:coarse breath sounds- sounds like fluid overload- mild GI: soft, NT, ND, (+)BS- normoactive Psychiatric: extremely upset about HD Neurological: Ox3   Skin: C/D/I. Multiple lesions on BL legs with various stages of healing.  + RUE fistula - intact   MSK:      LLE knee with palpable effusion, no warmth, no ttp Mood: Pleasant affect, appropriate mood.  Neurologic exam: Alert and oriented x 4, fluent, follows commands, cranial nerves II through XII grossly intact, no hypertonia noted  Sensation: Reduced to light touch in distal right lower extremity to the mid-thigh       Strength:                RUE: 2/5 SA, 2/5 EF, 2/5 EE, 2/5 WE, 2/5 FF, 2/5 FA                LUE:  3/5  SA, 3/5 EF, 3/5 EE, 3/5 WE, 3/5 FF, 3/5 FA                RLE: 2/5 HF, 2/5 KE, 2/5  DF, 2/5  EHL, 2/5  PF                 LLE:  3/5 HF, 4/5 KE, 4/5  DF, 4/5  EHL, 4/5  PF  Neuroexam stable 4/18  Assessment/Plan: 1. Functional deficits which require 3+ hours per day of interdisciplinary therapy in a comprehensive inpatient rehab setting. Physiatrist is providing close team supervision and 24 hour management of active medical problems listed below. Physiatrist and rehab team continue to assess barriers to discharge/monitor patient progress toward functional and medical goals  Care Tool:  Bathing    Body parts bathed by patient: Right arm, Chest, Abdomen, Right upper leg, Left upper leg, Face   Body parts bathed by helper:  Left arm, Front perineal area, Buttocks, Right lower leg, Left lower leg     Bathing assist Assist Level: Moderate Assistance - Patient 50 - 74%     Upper Body Dressing/Undressing Upper body dressing   What is the patient wearing?: Pull over shirt    Upper body assist Assist Level: Moderate Assistance - Patient 50 - 74%    Lower Body Dressing/Undressing Lower body dressing      What is the patient wearing?: Pants     Lower body assist Assist for lower body dressing: Maximal Assistance - Patient 25 - 49%     Toileting Toileting Toileting Activity did not occur (Clothing management and hygiene only): N/A (no void or bm) (anuric, HD patient)  Toileting assist Assist for toileting: Maximal Assistance - Patient 25 - 49%     Transfers Chair/bed transfer  Transfers assist     Chair/bed transfer assist level: 2 Helpers     Locomotion Ambulation   Ambulation assist   Ambulation activity did not occur: Safety/medical concerns          Walk 10 feet activity   Assist  Walk 10 feet activity did not occur: Safety/medical concerns        Walk 50 feet activity   Assist Walk 50 feet with 2 turns activity did not occur: Safety/medical  concerns         Walk 150 feet activity   Assist Walk 150 feet activity did not occur: Safety/medical concerns         Walk 10 feet on uneven surface  activity   Assist Walk 10 feet on uneven surfaces activity did not occur: Safety/medical concerns         Wheelchair     Assist Is the patient using a wheelchair?: Yes Type of Wheelchair: Manual    Wheelchair assist level: Dependent - Patient 0% Max wheelchair distance: 150 ft    Wheelchair 50 feet with 2 turns activity    Assist        Assist Level: Dependent - Patient 0%   Wheelchair 150 feet activity     Assist      Assist Level: Dependent - Patient 0%   Blood pressure 108/76, pulse (!) 106, temperature 98.6 F (37 C), temperature source Oral, resp. rate 13, height 6\' 1"  (1.854 m), weight 106.5 kg, SpO2 99%.  Medical Problem List and Plan: 1. 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  Antithrombotics: -DVT/anticoagulation:  Mechanical: Sequential compression devices, below knee Bilateral lower extremities             -antiplatelet therapy: N/A--no antiplatelet due to hx of recurrent GIB 3. Pain Management:  oxycodone  prn. Gabapentin  300 mg/hs.              --has Flexeril  10 mg TID prn              - patient with intermittent, severe b/l headache - add topomax 25  mg BID    4/19- pt rating pain 07/19/08 which is "tolerable"  4/20- no change in pain 4. Mood/Behavior/Sleep: LCSW to follow for evaluation and support             -antipsychotic agents: N/A             --melatonin prn for insomnia.  5.  Neuropsych/cognition: This patient is capable of making decisions on his own behalf. 6. Skin/Wound Care: Routine pressure relief measures.   4/19- due to ulcers on legs, on low air loss mattress 7. Fluids/Electrolytes/Nutrition: Strict I/O. Daily weights. Labs with HD. 8. ESRD- HD MWF: Schedule HD at the end of the day to help with tolerance of therapy             --Sensipar  and Renvela  for metabolic bone disease.   - Patient reports no longer makes urine  4/20- pt getting HD this AM- last HD was Wednesday.  9. Anemia of chronic disease: Monitor H/H 10. H/o recurrent GIB/Jehovah's Witness: Continue Protonix  BID 11. H/o Hypotension: Has had drop  in BP with HD. Now cervical SCI.  --Monitor for symptoms day after HD and with activity.  -4/18 baseline blood pressure stable, monitor with activity 4/19- BP actually slightly elevated today- 140s-150's- con't regimen, since can drop with HD and therapy 4/20- BP actually doing better today- in 100s'110s- however HR elevated, likely to fluid overload    06/02/2023   10:30 AM 06/02/2023   10:03 AM 06/02/2023    9:57 AM  Vitals with BMI  Systolic 108 104 86  Diastolic 76 74 74  Pulse 106 103 110    12. Constipation: Small BM on 04/14 --had of nulytey yesterday and another today?  -Xray 4/17 without excessive stool, patient reports stool still little hard yesterday.  Will increase MiraLAX  to 3 times a day, continue Senokot twice a day. Suppository PRN   4/19- had stool last night- large soft stool 13. ?left knee effusion. Not currently bothersome. Consider imaging.     I spent a total of  59  minutes on total care today- >50% coordination of care- due to  D/w nursing at length about HD- and trying to get pt to HD this AM- also calming pt down as much as possible- pt extremely upset- also did IPOC and d/w PT- also d/w nursing and Renal when pt's HR went into 120's- given IVF's 250cc and can repeat as needed- also has fever of 101.9- have  added CXR, as well as blood Cx's- and just had CBC/Renal function panel this AM- WBC 7.0- - also asked nursing to cath pt for U/A even thogh anuric if has formed any urine in last 6 months- since is SCI- could be forming urine and not peeing- and got UTI- will also order procalcitonin and lactic acid and recheck CBC/dd and renal function panel in AM- also ordered Vanc and Cefipime pharm consult for dosing.    Addendum 2- pt's Fever got up to 102.5- tylenol  then given- temp came down to 101.7 1 hour after tylenol - tachycardia down to 122- because it's so elevated, got EKG- sinus tachycardia- - Procalcitonin 0.55- on Vanc and Cefepime - also Lactic acid 1.4- still in normal range- so less likely true sepsis- also temp came up to  103, but was under many blankets-  Agreed with nursing to call rapid if vitals get worse and possibly have pt transferred to acute- however if stay where they are, then  need IV ABX to get a  chance to work- I'm reassured by current lactic acid level-  I gave pt a Metoprolol  12.5 mg-BID prn for HR >120 -  because I'm concerned about giving too much of a beta blocker because his BP was 78 systolic in HD today- is now 134 systolic but I don't want him to have  a drop in BP too much.  Am not giving another dose of IVFs, because getting another 400+cc with IV Vanc and Cefepime  as well.   LOS: 3 days A FACE TO FACE EVALUATION WAS PERFORMED  Rudene Poulsen 06/02/2023, 10:41 AM

## 2023-06-02 NOTE — Progress Notes (Signed)
 Occupational Therapy Session Note  Patient Details  Name: Carl Barkan Sr. MRN: [id number] Date of Birth: 07/05/1960  Today's Date: 06/02/2023 OT Individual Time: 1430-1502 OT Individual Time Calculation (min): 32 min  and Today's Date: 06/02/2023 OT Missed Time: 28 Minutes Missed Time Reason: Nursing care;Other (comment) (pt with yellow mews and high HR, nsg placing stat IV for fluids, will attempt to make up missed time as able)   Short Term Goals: Week 1:  OT Short Term Goal 1 (Week 1): Pt will perform toilet transfer with consistent Mod A (x1) + LRAD. OT Short Term Goal 2 (Week 1): Pt will thread BLE into LB garments with Min A + LRAD. OT Short Term Goal 3 (Week 1): Pt will bathe UB with Min A + LRAD.  Skilled Therapeutic Interventions/Progress Updates:      Therapy Documentation Precautions:  Precautions Precautions: Fall, Cervical, Other (comment) (HD MWF (RUE restricted).) Recall of Precautions/Restrictions: Impaired Precaution/Restrictions Comments: Verbally reviewed cervical precautions. Required Braces or Orthoses: Cervical Brace Cervical Brace: Hard collar Restrictions Weight Bearing Restrictions Per Provider Order: No General: "We'll try again tomorrow" Pt supine in bed upon OT arrival, agreeable to OT session. Pt presenting with hard collar on in bed  Pain:  6/10 pain reported in neck, activity, intermittent rest breaks, distractions provided for pain management, pt reports tolerable to proceed.    Other Treatments: OT providing therapeutic use of self in order to build rapport and discuss patient current situation and goals for therapy. Pt reporting wanting to get OOB. OT checking pulse while supine, reading 127 bpm, immediately reported to nsg. Pt presenting slid down in bed, OT flattening bed to better position pt in bed before leaving noting air mattress deflated with pt in bed. OT noted unplugged bed and re-plugged in bed with immediate air flow and firming of  mattress after turning on bed and moved pt up in bed total A +2. OT educated pt and nsg to make sure air bed is plugged in to ensure skin safety and pressure injury prevention. OT relayed information to charge nurse for safety and pressure injury prevention.   Pt supine in bed with bed alarm activated, 2 bed rails up, call light within reach and 4Ps assessed. Direct handoff to nsg for stat IV.    Therapy/Group: Individual Therapy  [name], OTD, OTR/L 06/02/2023, 3:29 PM

## 2023-06-02 NOTE — Progress Notes (Signed)
 Patient is red mews at shift change. His HR is 120-128. Temp elevated but did come down to 101.7 after PO tylenol  at 1830. His HR has remained elevated and temp continues to increase it was 103.2 at last check. Several calls have been make to Dr Lovorn with updates. She did order one time dose of coreg  3.125mg  to see if that would bring his HR down. It was given at 2044 and at 2140 his BP dropped to 89/55. I called and request rapid nurse come evaluate and called Dr Raynaldo Call with an update. Pt has denied pain. His lungs have been clear. He is oriented x4. IV ABT infused and second  bolus of NS is finishing infusion at this time.

## 2023-06-02 NOTE — Progress Notes (Signed)
 Patient was to have HD during the over night on this shift. I spoke with HD department several times per pt request. He was upset that he was still waiting to be taken for HD. He was originally scheduled to have HD on Monday-Wednesday and Friday but he was not taken on Friday. He requested this nurse speak with the HD manager. He thought the manager was "Ashimic". When I spoke with the HD department they report there would not be a Production designer, theatre/television/film available until Monday and the manager was Young. I did ask they have the manager see Mr Trull on Monday per pt request.  He does have generalized non pitting edema and a wet cough. Vitals are stable at this time.  HD department did just confirm pt would be picked up and transported to their department at 0630 today. Patient is sleeping at this time so I did not wake him to tell him the time for HD.

## 2023-06-02 NOTE — Progress Notes (Signed)
 [facility] Progress Note   Subjective:   Patient seen on HD today. His BP got soft and he became tachycardic during treatment. IV fluids were given and his pressure corrected. He reports that he is doing well with no complaints or concerns. Wearing C-spine and has honeycomb dressing on neck. No reported dyspnea or CP.   Objective Vitals:   06/02/23 1100 06/02/23 1130 06/02/23 1156 06/02/23 1200  BP: 102/79 109/78 113/80 131/72  Pulse: (!) 109 (!) 111 (!) 111 (!) 114  Resp: 11 10 12 11   Temp:    98.4 F (36.9 C)  TempSrc:      SpO2: 97% 98% 96% 98%  Weight:    104.5 kg  Height:       Physical Exam General: Pleasant AA male, no acute distress, in C-spine collar with honeycomb dressing Heart: RRR, no murmur, gallops, or rubs Lungs: CTA bilaterally, no wheeze or rales  Abdomen: soft, NT, ND, +b/s Extremities: No bruising or lesions, No LEE, euvolemic on exam Dialysis Access:  RFA AVF +t/b  Additional Objective Labs: Basic Metabolic Panel: Recent Labs  Lab 05/27/23 0921 05/29/23 2150 06/02/23 0840  NA 136 130* 129*  K 4.5 5.5* 5.2*  CL 94* 92* 90*  CO2 27 24 24   GLUCOSE 95 108* 104*  BUN 54* 47* 65*  CREATININE 9.65* 9.36* 10.62*  CALCIUM  9.1 8.8* 7.6*  PHOS  --  6.2* 5.8*   Liver Function Tests: Recent Labs  Lab 05/27/23 0921 05/29/23 2150 06/02/23 0840  AST 15  --   --   ALT 6  --   --   ALKPHOS 71  --   --   BILITOT 0.5  --   --   PROT 6.9  --   --   ALBUMIN  2.8* 2.9* 2.4*    CBC: Recent Labs  Lab 05/27/23 0921 05/29/23 2155 06/02/23 0840  WBC 7.3 8.2 7.0  HGB 8.4* 9.3* 8.8*  HCT 26.1* 29.5* 27.1*  MCV 90.3 90.2 90.0  PLT 199 266 308   Blood Culture    Component Value Date/Time   SDES BLOOD LEFT HAND 08/01/2015 0624   SPECREQUEST BOTTLES DRAWN AEROBIC AND ANAEROBIC  5CC 08/01/2015 0624   CULT NO GROWTH 5 DAYS 08/01/2015 0624   REPTSTATUS 08/06/2015 FINAL 08/01/2015 0624    Cardiac Enzymes: No results for input(s):  "CKTOTAL", "CKMB", "CKMBINDEX", "TROPONINI" in the last 168 hours. CBG: No results for input(s): "GLUCAP" in the last 168 hours.  Iron Studies: No results for input(s): "IRON", "TIBC", "TRANSFERRIN", "FERRITIN" in the last 72 hours. @lablastinr3 @ Studies/Results: No results found.  Medications:    (feeding supplement) PROSource Plus  30 mL Oral BID BM   [START ON 06/06/2023] atorvastatin   10 mg Oral Q Thu   carbamide peroxide  5 drop Right EAR BID   Chlorhexidine  Gluconate Cloth  6 each Topical Q0600   Chlorhexidine  Gluconate Cloth  6 each Topical Q0600   cinacalcet   120 mg Oral Q supper   gabapentin   300 mg Oral QHS   pantoprazole   40 mg Oral BID AC   polyethylene glycol  17 g Oral TID   senna-docusate  2 tablet Oral BID   sevelamer  carbonate  3,200 mg Oral TID WC   topiramate   25 mg Oral BID   OP HD: NW MWF 4h  B400   85.5kg   2K bath  AVF Heparin  3000 Mircera 60mcg IV q 2 weeks- last dose 05/24/23 Calcitriol  1.5mcg PO q HD  Renal -related home meds: Sensipar  120 mg at bedtime Lokelma  10 g daily Rena-Vite 1 daily Renvela  3-4 AC 3 times daily Others: Carafate  1 g twice daily as needed, PPI, Percocet, Neurontin , eyedrops, amoxicillin , Lipitor   Assessment/Plan: 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  Patient in rehab.  ESRD: HD MWF. HD 06/02/23.  HTN: BP dropped during HD and received 2L IVF. His BP corrected. Will have him take midodrine  before next HD treatment to ensure we can do UF.  Volume: stable, euvolemic on exam.  Anemia: Hgb 8- 10.5, ESA given 05/24/23 Metabolic bone disease: Corrected Ca 8.9, restarting VDRA. Phos 5.8 on Sevelamer  binder  Nutrition:  On renal diet. Alb 2.4. Adding prosource supplement     [name], [name] 06/02/2023, 12:52 PM  [employer]

## 2023-06-02 NOTE — Progress Notes (Signed)
   06/02/23 1517  Assess: MEWS Score  Temp (!) 101.9 F (38.8 C)  BP (!) 144/86  MAP (mmHg) 100  Pulse Rate (!) 125  Resp 18  Level of Consciousness Alert  SpO2 100 %  O2 Device Room Air  Assess: MEWS Score  MEWS Temp 2  MEWS Systolic 0  MEWS Pulse 2  MEWS RR 0  MEWS LOC 0  MEWS Score 4  MEWS Score Color Red  Assess: if the MEWS score is Yellow or Red  Were vital signs accurate and taken at a resting state? Yes  Does the patient meet 2 or more of the SIRS criteria? Yes  Does the patient have a confirmed or suspected source of infection? No  MEWS guidelines implemented  Yes, red  Treat  MEWS Interventions Considered administering scheduled or prn medications/treatments as ordered  Take Vital Signs  Increase Vital Sign Frequency  Red: Q1hr x2, continue Q4hrs until patient remains green for 12hrs  Escalate  MEWS: Escalate Red: Discuss with charge nurse and notify provider. Consider notifying RRT. If remains red for 2 hours consider need for higher level of care  Notify: Charge Nurse/RN  Name of Charge Nurse/RN Notified [facility] RN  Provider Notification  Provider Name/Title Dr Raynaldo Call MD  Date Provider Notified 06/02/23  Time Provider Notified 1547  Method of Notification Call  Notification Reason Change in status  Provider response See new orders  Date of Provider Response 06/02/23  Time of Provider Response 1547  Assess: SIRS CRITERIA  SIRS Temperature  1  SIRS Respirations  0  SIRS Pulse 1  SIRS WBC 0  SIRS Score Sum  2

## 2023-06-02 NOTE — Progress Notes (Signed)
 David rapid response nurse came and assessed. He recommends to continue to monitor, continue with PRN tylenol  and cool packs to decrease temp and call if patients blood pressure continues to drop or if he develops AMS.  Called Dr Lovorn with update. No new orders at this time. She confirmed CBC to be drawn in AM. IV appears to have infiltrated and he still has 100cc of SN to infuse from second bolus. STAT order for IV team to came assess for new site.  PRN tylenol  given and ice packs applied to bilateral axillary.  Pt continues to deny pain. Blood pressure down to 82/57. Will monitor. Charge nurse aware of condition.

## 2023-06-02 NOTE — IPOC Note (Signed)
 Overall Plan of Care [facility]) Patient Details Name: Carl Qu Sr. MRN: [id number] DOB: 1960/12/07  Admitting Diagnosis: Spinal cord injury, cervical region, sequela [facility])  Hospital Problems: Principal Problem:   Spinal cord injury, cervical region, sequela (HCC)     Functional Problem List: Nursing Bladder, Bowel, Edema, Endurance, Medication Management, Motor, Pain, Perception, Safety, Skin Integrity  PT Balance, Skin Integrity  OT Balance, Endurance, Motor, Pain, Perception, Safety, Sensory, Skin Integrity  SLP    TR         Basic ADL's: OT Eating, Grooming, Bathing, Dressing, Toileting     Advanced  ADL's: OT       Transfers: PT Bed Mobility, Bed to Chair, Car  OT Toilet, Tub/Shower     Locomotion: PT Ambulation, Psychologist, prison and probation services, Stairs     Additional Impairments: OT Fuctional Use of Upper Extremity  SLP        TR      Anticipated Outcomes Item Anticipated Outcome  Self Feeding Setup A  Swallowing      Basic self-care  Min A  Toileting  Min A   Bathroom Transfers Min A  Bowel/Bladder  manage bowels with medications/ oliguric  Transfers  CGA with LRAD  Locomotion  min a with LRAD, min a stairs  Communication     Cognition     Pain  <4 w/ prns  Safety/Judgment  manage safety with minimal assistance   Therapy Plan: PT Intensity: Minimum of 1-2 x/day ,45 to 90 minutes PT Frequency: 5 out of 7 days PT Duration Estimated Length of Stay: 4 weeks OT Intensity: Minimum of 1-2 x/day, 45 to 90 minutes OT Frequency: 5 out of 7 days OT Duration/Estimated Length of Stay: ~4 weeks     Team Interventions: Nursing Interventions Patient/Family Education, Disease Management/Prevention, Discharge Planning, Bowel Management, Pain Management, Medication Management  PT interventions Ambulation/gait training, Cognitive remediation/compensation, Discharge planning, DME/adaptive equipment instruction, Functional mobility training, Pain management,  Psychosocial support, Therapeutic Activities, Splinting/orthotics, UE/LE Strength taining/ROM, Visual/perceptual remediation/compensation, Wheelchair propulsion/positioning, UE/LE Coordination activities, Stair training, Therapeutic Exercise, Skin care/wound management, Patient/family education, Neuromuscular re-education, Disease management/prevention, Warden/ranger, Firefighter, Development worker, international aid stimulation  OT Interventions Warden/ranger, Cognitive remediation/compensation, Firefighter, Discharge planning, Fish farm manager, Functional electrical stimulation, Functional mobility training, Disease mangement/prevention, Neuromuscular re-education, Pain management, Patient/family education, Psychosocial support, Skin care/wound managment, Self Care/advanced ADL retraining, Therapeutic Activities, Therapeutic Exercise, Splinting/orthotics, UE/LE Strength taining/ROM, Visual/perceptual remediation/compensation, UE/LE Coordination activities, Wheelchair propulsion/positioning  SLP Interventions    TR Interventions    SW/CM Interventions Discharge Planning, Psychosocial Support, Patient/Family Education   Barriers to Discharge MD  Medical stability, Home enviroment access/loayout, Neurogenic bowel and bladder, Wound care, Lack of/limited family support, Hemodialysis, and Weight bearing restrictions  Nursing Decreased caregiver support, Home environment access/layout, Neurogenic Bowel & Bladder, Hemodialysis Discharge: Apartment  Discharge Home Layout: One level  Discharge Home Access: Stairs to enter  Entrance Stairs-Rails: Right, Left  Entrance Stairs-Number of Steps: 18 steps; second floor apartment  PT Inaccessible home environment, Decreased caregiver support, Home environment access/layout    OT Inaccessible home environment, Home environment access/layout, Wound Care, Lack of/limited family support, Hemodialysis    SLP       SW Inaccessible home environment, Decreased caregiver support, Lack of/limited family support, Home environment Best boy, Hemodialysis, Insurance for SNF coverage     Team Discharge Planning: Destination: PT-Home ,OT- Home , SLP-  Projected Follow-up: PT-Home health PT, OT-  Home health OT, SLP-  Projected Equipment Needs: PT-To be determined, OT- To be  determined, SLP-  Equipment Details: PT- , OT-  Patient/family involved in discharge planning: PT- Patient,  OT-Patient, SLP-   MD ELOS: 4 weeks Medical Rehab Prognosis:  Good Assessment: The patient has been admitted for CIR therapies with the diagnosis of Cervical SCI/incomplete quadriplegia- traumatic. The team will be addressing functional mobility, strength, stamina, balance, safety, adaptive techniques and equipment, self-care, bowel and bladder mgt, patient and caregiver education, . Goals have been set at min A. Anticipated discharge destination is home with son.        See Team Conference Notes for weekly updates to the plan of care

## 2023-06-02 NOTE — Progress Notes (Signed)
 Pharmacy Antibiotic Note  Carl Bradsher Sr. is a 63 y.o. male admitted on 05/30/2023 with sepsis.  Pharmacy has been consulted for vancomycin  and cefepime  dosing.  WBC 7, Tmax 101.9. Scr up to 10.62 (CrCL 8.9 mL/min) but ESRD patient.  Plan: Cefepime  1g IV every 24 hours Vanc 2g IV once then 1g IV qHD Monitor HD schedule, cx results, clinical pic, and vanc as needed   Height: 6\' 1"  (185.4 cm) Weight: 104.5 kg (230 lb 6.1 oz) IBW/kg (Calculated) : 79.9  Temp (24hrs), Avg:99.1 F (37.3 C), Min:97.9 F (36.6 C), Max:101.9 F (38.8 C)  Recent Labs  Lab 05/27/23 0921 05/29/23 2150 05/29/23 2155 06/02/23 0840  WBC 7.3  --  8.2 7.0  CREATININE 9.65* 9.36*  --  10.62*    Estimated Creatinine Clearance: 8.9 mL/min (A) (by C-G formula based on SCr of 10.62 mg/dL (H)).    Allergies  Allergen Reactions   Infed [Iron Dextran] Other (See Comments)    Decreased BP    Vicodin [Hydrocodone -Acetaminophen ] Other (See Comments)    Decreased BP   Diclofenac Other (See Comments)    Caused hypotension    Antimicrobials this admission: Vancomycin  4/20 >>  Cefepime  4/20 >>   Dose adjustments this admission: N/A  Microbiology results: 4/20 BCx: ordered  Thank you for allowing pharmacy to participate in this patient's care,  Nieves Bars, PharmD, BCCCP Clinical Pharmacist  Phone: [phone] 06/02/2023 4:29 PM  Please check AMION for all [facility] Pharmacy phone numbers After 10:00 PM, call Main Pharmacy [phone]

## 2023-06-02 NOTE — Progress Notes (Signed)
   06/02/23 1200  Vitals  Temp 98.4 F (36.9 C)  Pulse Rate (!) 114  Resp 11  BP 131/72  SpO2 98 %  O2 Device Room Air  Weight 104.5 kg  Type of Weight Post-Dialysis  Post Treatment  Dialyzer Clearance Lightly streaked  Hemodialysis Intake (mL) 100 mL  Liters Processed 72  Fluid Removed (mL) 1900 mL  Tolerated HD Treatment Yes  Post-Hemodialysis Comments Pt unable to reach MD-prescibed goal of 3L. Pt goal reduced to 2L and ns bolus given when bp initially dropped. Pt alert and oriented x 4 throughout treatment. MD-[place] notified of goal reduction.  AVG/AVF Arterial Site Held (minutes) 5 minutes  AVG/AVF Venous Site Held (minutes) 5 minutes   Received patient in bed to unit.  Alert and oriented.  Informed consent signed and in chart.   TX duration: three hours  Patient tolerated well.  Transported back to the room  Alert, without acute distress.  Hand-off given to patient's nurse.   Access used: Right upper arm fistula Access issues: none

## 2023-06-02 NOTE — Progress Notes (Signed)
   06/02/23 1258  Assess: MEWS Score  Temp 99 F (37.2 C)  BP 125/83  Pulse Rate (!) 121  Resp 14  Level of Consciousness Alert  SpO2 100 %  O2 Device Room Air  Assess: MEWS Score  MEWS Temp 0  MEWS Systolic 0  MEWS Pulse 2  MEWS RR 0  MEWS LOC 0  MEWS Score 2  MEWS Score Color Yellow  Assess: if the MEWS score is Yellow or Red  Were vital signs accurate and taken at a resting state? Yes  Does the patient meet 2 or more of the SIRS criteria? No  MEWS guidelines implemented  Yes, yellow  Treat  MEWS Interventions Considered administering scheduled or prn medications/treatments as ordered  Take Vital Signs  Increase Vital Sign Frequency  Yellow: Q2hr x1, continue Q4hrs until patient remains green for 12hrs  Escalate  MEWS: Escalate Yellow: Discuss with charge nurse and consider notifying provider and/or RRT  Notify: Charge Nurse/RN  Name of Charge Nurse/RN Notified HILARY RN  Provider Notification  Provider Name/Title Dr. Raynaldo Call MD  Date Provider Notified 06/02/23  Time Provider Notified 1320  Method of Notification Call  Notification Reason Change in status  Provider response See new orders  Date of Provider Response 06/02/23  Time of Provider Response 1320  Assess: SIRS CRITERIA  SIRS Temperature  0  SIRS Respirations  0  SIRS Pulse 1  SIRS WBC 0  SIRS Score Sum  1

## 2023-06-03 DIAGNOSIS — I10 Essential (primary) hypertension: Secondary | ICD-10-CM | POA: Diagnosis not present

## 2023-06-03 DIAGNOSIS — S14109S Unspecified injury at unspecified level of cervical spinal cord, sequela: Secondary | ICD-10-CM | POA: Diagnosis not present

## 2023-06-03 DIAGNOSIS — N186 End stage renal disease: Secondary | ICD-10-CM | POA: Diagnosis not present

## 2023-06-03 DIAGNOSIS — I951 Orthostatic hypotension: Secondary | ICD-10-CM | POA: Diagnosis not present

## 2023-06-03 DIAGNOSIS — K5900 Constipation, unspecified: Secondary | ICD-10-CM

## 2023-06-03 DIAGNOSIS — R6510 Systemic inflammatory response syndrome (SIRS) of non-infectious origin without acute organ dysfunction: Secondary | ICD-10-CM

## 2023-06-03 LAB — CBC WITH DIFFERENTIAL/PLATELET
Abs Immature Granulocytes: 0.03 10*3/uL (ref 0.00–0.07)
Basophils Absolute: 0.1 10*3/uL (ref 0.0–0.1)
Basophils Relative: 1 %
Eosinophils Absolute: 0.2 10*3/uL (ref 0.0–0.5)
Eosinophils Relative: 3 %
HCT: 23.7 % — ABNORMAL LOW (ref 39.0–52.0)
Hemoglobin: 7.5 g/dL — ABNORMAL LOW (ref 13.0–17.0)
Immature Granulocytes: 0 %
Lymphocytes Relative: 12 %
Lymphs Abs: 0.9 10*3/uL (ref 0.7–4.0)
MCH: 28.5 pg (ref 26.0–34.0)
MCHC: 31.6 g/dL (ref 30.0–36.0)
MCV: 90.1 fL (ref 80.0–100.0)
Monocytes Absolute: 1.5 10*3/uL — ABNORMAL HIGH (ref 0.1–1.0)
Monocytes Relative: 21 %
Neutro Abs: 4.4 10*3/uL (ref 1.7–7.7)
Neutrophils Relative %: 63 %
Platelets: 271 10*3/uL (ref 150–400)
RBC: 2.63 MIL/uL — ABNORMAL LOW (ref 4.22–5.81)
RDW: 15.6 % — ABNORMAL HIGH (ref 11.5–15.5)
WBC: 7.1 10*3/uL (ref 4.0–10.5)
nRBC: 0 % (ref 0.0–0.2)

## 2023-06-03 LAB — RENAL FUNCTION PANEL
Albumin: 2.1 g/dL — ABNORMAL LOW (ref 3.5–5.0)
Anion gap: 14 (ref 5–15)
BUN: 48 mg/dL — ABNORMAL HIGH (ref 8–23)
CO2: 25 mmol/L (ref 22–32)
Calcium: 7.5 mg/dL — ABNORMAL LOW (ref 8.9–10.3)
Chloride: 92 mmol/L — ABNORMAL LOW (ref 98–111)
Creatinine, Ser: 8.71 mg/dL — ABNORMAL HIGH (ref 0.61–1.24)
GFR, Estimated: 6 mL/min — ABNORMAL LOW (ref >60)
Glucose, Bld: 102 mg/dL — ABNORMAL HIGH (ref 70–99)
Phosphorus: 4.3 mg/dL (ref 2.5–4.6)
Potassium: 5.1 mmol/L (ref 3.5–5.1)
Sodium: 131 mmol/L — ABNORMAL LOW (ref 135–145)

## 2023-06-03 MED ORDER — VANCOMYCIN VARIABLE DOSE PER UNSTABLE RENAL FUNCTION (PHARMACIST DOSING): Status: DC

## 2023-06-03 MED ORDER — PSYLLIUM 95 % PO PACK
1.0000 | PACK | Freq: Three times a day (TID) | ORAL | Status: DC
  Administered 2023-06-03 – 2023-07-14 (×100): 1 via ORAL
  Filled 2023-06-03 (×109): qty 1

## 2023-06-03 MED ORDER — CHLORHEXIDINE GLUCONATE CLOTH 2 % EX PADS
6.0000 | MEDICATED_PAD | Freq: Every day | CUTANEOUS | Status: DC
  Administered 2023-06-04 – 2023-06-18 (×10): 6 via TOPICAL

## 2023-06-03 MED ORDER — MIDODRINE HCL 5 MG PO TABS
10.0000 mg | ORAL_TABLET | ORAL | Status: DC | PRN
  Administered 2023-06-24: 10 mg via ORAL
  Filled 2023-06-03: qty 2

## 2023-06-03 NOTE — Progress Notes (Addendum)
 Followed up with dialysis (RN).Patient still awaiting to go to dialysis; patient to go dialysis today per dialysis RN. Notified oncoming nurse.  Randeen Busman, LPN

## 2023-06-03 NOTE — Progress Notes (Signed)
 Patient blood pressure remained in 80's/50's. Heart rate is in the 90's. No other concerns. MEWS changed to green at 0450. Notified Dr Raynaldo Call via phone with update that BP has not changed over night no new concerns.

## 2023-06-03 NOTE — Progress Notes (Signed)
 Followed up with Charge RN (dialysis) patient to go to dialysis tomorrow 06/04/23.  Randeen Busman, LPN

## 2023-06-03 NOTE — Progress Notes (Signed)
 Met with patient to review current situation, team conference and plan of care. Reviewed medications, fluid restrictions, diet, incision care, cervical collar, pain,  sleep and SCI support group. He claims air mattress  was helping with his buttom. Continue to follow along to provide educational materials to facilitate preparation for discharge.

## 2023-06-03 NOTE — Progress Notes (Signed)
 Patient blood pressure remained low so far this shift, 80's/50's, this is about where is was when rapid response nurse assessed. He continues to deny pain. His temp has decreased and his MEWS changed from red to yellow at 0250. His heart rate has decreased and is now below 100. His temp is now 100.2.  He reports feeling better than he did last night. I will continue alternating ice packs and cool clothes to help keep his temp down. He has napped between vital sign checks. He has an increased thirst this morning and requested a snack. No new concerns at this time.

## 2023-06-03 NOTE — Plan of Care (Signed)
  Problem: Consults Goal: RH SPINAL CORD INJURY PATIENT EDUCATION Description:  See Patient Education module for education specifics.  Outcome: Progressing   Problem: SCI BOWEL ELIMINATION Goal: RH STG MANAGE BOWEL WITH ASSISTANCE Description: STG Manage Bowel with minimal Assistance. Outcome: Progressing Goal: RH STG SCI MANAGE BOWEL WITH MEDICATION WITH ASSISTANCE Description: STG SCI Manage bowel with medication with assistance. Outcome: Progressing   Problem: RH SKIN INTEGRITY Goal: RH STG SKIN FREE OF INFECTION/BREAKDOWN Description: Manage skin free of infection/breakdown with minimal assistance Outcome: Progressing   Problem: RH SAFETY Goal: RH STG ADHERE TO SAFETY PRECAUTIONS W/ASSISTANCE/DEVICE Description: STG Adhere to Safety Precautions With minimal Assistance/Device. Outcome: Progressing

## 2023-06-03 NOTE — Progress Notes (Signed)
 Physical Therapy Session Note  Patient Details  Name: Carl Tignor Sr. MRN: [id number] Date of Birth: 1961/01/08  Today's Date: 06/03/2023 PT Individual Time: 4098-1191 PT Individual Time Calculation (min): 77 min   Short Term Goals: Week 1:  PT Short Term Goal 1 (Week 1): pt will perform STS with RW with assist of 1 PT Short Term Goal 2 (Week 1): Pt will perform stand pivot with LRAD with assist of 1 PT Short Term Goal 3 (Week 1): pt will initiate gait training  Skilled Therapeutic Interventions/Progress Updates:    pt received in bed and agreeable to therapy. C/o mild headache, no further intervention required at this time.   Vitals on arrival- BP=93/56(66), HR = 94 Added teds- 90/57(67), HR=94 After several minutes LE exercise in chair position- 90/54(66), HR=96 Seated EOB, 96/64(75), HR=101  Pt remained asymptomatic with no s/s of orthostatic hypotension, so proceeded to perform stedy transfer to chair.On stand to stedy with max a x 2, pt found to be incontinent of bowel, so transferred to bathroom. Dependent brief and pants change. Pt handed off to OT while completing hygiene care.   Therapy Documentation Precautions:  Precautions Precautions: Fall, Cervical, Other (comment) (HD MWF (RUE restricted).) Recall of Precautions/Restrictions: Impaired Precaution/Restrictions Comments: Verbally reviewed cervical precautions. Required Braces or Orthoses: Cervical Brace Cervical Brace: Hard collar Restrictions Weight Bearing Restrictions Per Provider Order: No General:       Therapy/Group: Individual Therapy  Tex Filbert 06/03/2023, 10:17 AM

## 2023-06-03 NOTE — Progress Notes (Signed)
 Occupational Therapy Session Note  Patient Details  Name: Carl Mcandrew Sr. MRN: [id number] Date of Birth: 03-May-1960  Today's Date: 06/03/2023 OT Individual Time: 0800-0900 & 1100-1200 OT Individual Time Calculation (min): 60 min & 60 min   Short Term Goals: Week 1:  OT Short Term Goal 1 (Week 1): Pt will perform toilet transfer with consistent Mod A (x1) + LRAD. OT Short Term Goal 2 (Week 1): Pt will thread BLE into LB garments with Min A + LRAD. OT Short Term Goal 3 (Week 1): Pt will bathe UB with Min A + LRAD.  Skilled Therapeutic Interventions/Progress Updates:      Therapy Documentation Precautions:  Precautions Precautions: Fall, Cervical, Other (comment) (HD MWF (RUE restricted).) Recall of Precautions/Restrictions: Impaired Precaution/Restrictions Comments: Verbally reviewed cervical precautions. Required Braces or Orthoses: Cervical Brace Cervical Brace: Hard collar Restrictions Weight Bearing Restrictions Per Provider Order: No Session 1 General: "Thank you!" Pt supine in bed upon OT arrival, agreeable to OT session.  Pain: no pain reported  ADL: OT providing skilled intervention in ADL tasks such as self feeding strategies in order to increase independence with task. OT issuing red tube grip, long straw, and plate guard for increased feeding independence and discussed purpose of each tool. OT set pt up with spoon, plate guard and red built up handle and pt was able to eat with increased independence. OT messaging team for RUE resting hand splint d/t limited mobility in Rt hand. OT educated pt on wearing schedule for increased positioning when sleeping and decreased stiffness upon awaking.    Pt supine in bed with bed alarm activated, 2 bed rails up, call light within reach and 4Ps assessed.   Session 2 General: "Hey there!" Pt seated on toilet with PT and rehab tech.   Pain: no pain reported  Vitals:  Beginning of session after toileting with thigh high  TEDs: 93/54 BP, 95 bpm and 98% SpO2 End of session in W/C with thigh high TEDs and ACE wraps: 103/62, 90 bpm and 98% SpO2  ADL: OT providing skilled intervention on ADL retraining in order to increase independence with tasks and increase activity tolerance. Pt completed the following tasks at the current level of assist: Grooming/oral hygiene: Min A, OT providing assistance with set-up for wash cloth and red tube grip on toothbrush, pt able to use left hand to manipulate wash cloth and toothbrush  Toilet transfer: stedy transfer +1, Mod A +2 to stand within stedy Toileting: total A for hygiene after BM and pants management Footwear: total A for wrapping BLE with ACE wraps and donning socks  Other Treatments: OT providing education on pressure relief strategies and providing demonstration for increased skin safety.  OT providing assistance with changing W/C for increased positioning d/t increased leg length. OT swapped 18" for 20" for increased room for BLE. OT providing adjustments to head rest and arm rests for correct positioning strategies. OT assisting pt with stand Max A +3 with RLE knee block and VC for increase posture to change out W/C. OT educated nsg on pt feeding set up in order to increase independence with task during meals. OT switched pt to self call light in order for increased independence with calling for assistance if needed.    Pt seated in W/C at end of session with W/C alarm donned, call light within reach and 4Ps assessed.    Therapy/Group: Individual Therapy  [name], OTD, OTR/L 06/03/2023, 4:18 PM

## 2023-06-03 NOTE — Progress Notes (Signed)
 Orthopedic Tech Progress Note Patient Details:  Carl Spadafore Sr. [birth year] [id number]  Patient ID: Carl Pray Sr., male   DOB: [birth year], 63 y.o.   MRN: [id number]  Called in the order for [facility].   [name] 06/03/2023, 12:20 PM

## 2023-06-03 NOTE — Progress Notes (Signed)
 Followed up with Charge RN (dialysis) patient is to received dialysis per schedule.  Randeen Busman, LPN

## 2023-06-03 NOTE — Progress Notes (Signed)
 [name] NEPHROLOGY PROGRESS NOTE  Assessment/ Plan: Pt is a 63 y.o. 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  Patient in rehab.  #ESRD: HD MWF.  Completed HD yesterday and tolerated well.  No urgent need for HD today.  We will plan for dialysis tomorrow, off schedule.  Also with very high census. #HTN/volume: Problem with intradialytic hypotension, will add midodrine  before dialysis.   #Anemia: Hgb 8- 10.5, hemoglobin dropping, will dose Aranesp  tomorrow. # CKD-MBD/secondary hyperparathyroidism, continue sevelamer , Sensipar  and calcitriol .  Monitor calcium  phosphorus.  Subjective: Seen and examined.  Patient is sitting on chair comfortable with neck collar/support.  She is uncomfortable but denies nausea, vomiting, chest pain or shortness of breath. Objective Vital signs in last 24 hours: Vitals:   06/03/23 0621 06/03/23 0700 06/03/23 1000 06/03/23 1200  BP: (!) 81/57 93/62 (!) 93/55 110/74  Pulse: 95 85 94 89  Resp: 17 17  16   Temp: 98.5 F (36.9 C) 98.6 F (37 C)  98 F (36.7 C)  TempSrc: Oral Oral  Oral  SpO2: 99% 97%  98%  Weight:      Height:       Weight change: 1.5 kg  Intake/Output Summary (Last 24 hours) at 06/03/2023 1523 Last data filed at 06/03/2023 1253 Gross per 24 hour  Intake 2047 ml  Output --  Net 2047 ml       Labs: RENAL PANEL Recent Labs  Lab 05/29/23 2150 06/02/23 0840 06/03/23 0556  NA 130* 129* 131*  K 5.5* 5.2* 5.1  CL 92* 90* 92*  CO2 24 24 25   GLUCOSE 108* 104* 102*  BUN 47* 65* 48*  CREATININE 9.36* 10.62* 8.71*  CALCIUM  8.8* 7.6* 7.5*  PHOS 6.2* 5.8* 4.3  ALBUMIN  2.9* 2.4* 2.1*    Liver Function Tests: Recent Labs  Lab 05/29/23 2150 06/02/23 0840 06/03/23 0556  ALBUMIN  2.9* 2.4* 2.1*   No results for  input(s): "LIPASE", "AMYLASE" in the last 168 hours. No results for input(s): "AMMONIA" in the last 168 hours. CBC: Recent Labs    05/25/23 1933 05/27/23 0921 05/29/23 2155 06/02/23 0840 06/03/23 0556  HGB 10.5* 8.4* 9.3* 8.8* 7.5*  MCV  --  90.3 90.2 90.0 90.1    Cardiac Enzymes: No results for input(s): "CKTOTAL", "CKMB", "CKMBINDEX", "TROPONINI" in the last 168 hours. CBG: No results for input(s): "GLUCAP" in the last 168 hours.  Iron Studies: No results for input(s): "IRON", "TIBC", "TRANSFERRIN", "FERRITIN" in the last 72 hours. Studies/Results: DG CHEST PORT 1 VIEW Result Date: 06/02/2023 CLINICAL DATA:  Fever EXAM: PORTABLE CHEST 1 VIEW COMPARISON:  09/20/2021 FINDINGS: Right shoulder replacement. Cardiomegaly with aortic atherosclerosis. No acute airspace disease, pleural effusion or pneumothorax IMPRESSION: No active disease. Cardiomegaly. Electronically Signed   By: [name] M.D.   On: 06/02/2023 20:20    Medications: Infusions:  ceFEPime  (MAXIPIME ) IV 1 g (06/02/23 2133)    Scheduled Medications:  (feeding supplement) PROSource Plus  30 mL Oral BID BM   [START ON 06/06/2023] atorvastatin   10 mg Oral Q Thu   calcitRIOL   1.5 mcg Oral Once in dialysis   carbamide peroxide  5 drop Right EAR BID   Chlorhexidine  Gluconate Cloth  6 each Topical Q0600  Chlorhexidine  Gluconate Cloth  6 each Topical Q0600   cinacalcet   120 mg Oral Q supper   gabapentin   300 mg Oral QHS   midodrine   10 mg Oral Once in dialysis   pantoprazole   40 mg Oral BID AC   polyethylene glycol  17 g Oral TID   psyllium  1 packet Oral TID   senna-docusate  2 tablet Oral BID   sevelamer  carbonate  3,200 mg Oral TID WC   topiramate   25 mg Oral BID   vancomycin  variable dose per unstable renal function (pharmacist dosing)   Does not apply See admin instructions    have reviewed scheduled and prn medications.  Physical Exam: General:NAD, comfortable, neck collar Heart:RRR, s1s2 nl Lungs:clear  b/l, no crackle Abdomen:soft, Non-tender, non-distended Extremities:No edema Dialysis Access: AV fistula with good thrill and bruit  [name] 06/03/2023,3:23 PM  LOS: 4 days

## 2023-06-03 NOTE — Progress Notes (Addendum)
 Spoke with Charge (RN) to confirm patient is going to hemodialysis today.Patient awaiting to go to dialysis. Notified oncoming nurse.  Randeen Busman, LPN

## 2023-06-03 NOTE — Progress Notes (Addendum)
 PROGRESS NOTE   Subjective/Complaints: Patient frustrated by dialysis timing of prior session, scheduled for dialysis tomorrow.  Has fever yesterday and increased HR. was seen by rapid response at the time.  Patient reports no fevers this morning.  He has no complaints other than dialysis timing.  LBM last night   ROS:  Pt denies SOB, abd pain, CP, N/V/D, and vision changes Fevers yesterday  Objective:   DG CHEST PORT 1 VIEW Result Date: 06/02/2023 CLINICAL DATA:  Fever EXAM: PORTABLE CHEST 1 VIEW COMPARISON:  09/20/2021 FINDINGS: Right shoulder replacement. Cardiomegaly with aortic atherosclerosis. No acute airspace disease, pleural effusion or pneumothorax IMPRESSION: No active disease. Cardiomegaly. Electronically Signed   By: Esmeralda Hedge M.D.   On: 06/02/2023 20:20    Recent Labs    06/02/23 0840 06/03/23 0556  WBC 7.0 7.1  HGB 8.8* 7.5*  HCT 27.1* 23.7*  PLT 308 271   Recent Labs    06/02/23 0840 06/03/23 0556  NA 129* 131*  K 5.2* 5.1  CL 90* 92*  CO2 24 25  GLUCOSE 104* 102*  BUN 65* 48*  CREATININE 10.62* 8.71*  CALCIUM  7.6* 7.5*    Intake/Output Summary (Last 24 hours) at 06/03/2023 1651 Last data filed at 06/03/2023 1253 Gross per 24 hour  Intake 1317 ml  Output --  Net 1317 ml        Physical Exam: Vital Signs Blood pressure 110/74, pulse 89, temperature 98 F (36.7 C), temperature source Oral, resp. rate 16, height 6\' 1"  (1.854 m), weight 106.5 kg, SpO2 98%.    General: awake, alert, NAD, appears comfortable HENT: conjugate gaze; oropharynx moist CV: RRR Pulmonary: CTAB GI: soft, NT, ND, (+)BS- normoactive Psychiatric: Appropriate, still little upset about hemodialysis timing Neurological: Ox3   Skin: C/D/I. Multiple lesions on BL legs with various stages of healing.  + RUE fistula - intact   MSK:      LLE knee with palpable effusion, no warmth, no ttp Mood: Pleasant affect,  appropriate mood.  Neurologic exam: Alert and oriented x 4, fluent, follows commands, cranial nerves II through XII grossly intact, no hypertonia noted  Sensation: Reduced to light touch in distal right lower extremity to the mid-thigh       Strength:                RUE: 2/5 SA, 2/5 EF, 2/5 EE, 2/5 WE, 2/5 FF, 2/5 FA                LUE:  3/5 SA, 3/5 EF, 3/5 EE, 3/5 WE, 3/5 FF, 3/5 FA                RLE: 2/5 HF, 2/5 KE, 2/5  DF, 2/5  EHL, 2/5  PF                 LLE:  3/5 HF, 4/5 KE, 4/5  DF, 4/5  EHL, 4/5  PF  Neuroexam stable 4/21  Assessment/Plan: 1. Functional deficits which require 3+ hours per day of interdisciplinary therapy in a comprehensive inpatient rehab setting. Physiatrist is providing close team supervision and 24 hour management of active medical problems listed below. Physiatrist and  rehab team continue to assess barriers to discharge/monitor patient progress toward functional and medical goals  Care Tool:  Bathing    Body parts bathed by patient: Right arm, Chest, Abdomen, Right upper leg, Left upper leg, Face   Body parts bathed by helper: Left arm, Front perineal area, Buttocks, Right lower leg, Left lower leg     Bathing assist Assist Level: Moderate Assistance - Patient 50 - 74%     Upper Body Dressing/Undressing Upper body dressing   What is the patient wearing?: Pull over shirt    Upper body assist Assist Level: Moderate Assistance - Patient 50 - 74%    Lower Body Dressing/Undressing Lower body dressing      What is the patient wearing?: Pants     Lower body assist Assist for lower body dressing: Maximal Assistance - Patient 25 - 49%     Toileting Toileting Toileting Activity did not occur (Clothing management and hygiene only): N/A (no void or bm) (anuric, HD patient)  Toileting assist Assist for toileting: Maximal Assistance - Patient 25 - 49%     Transfers Chair/bed transfer  Transfers assist     Chair/bed transfer assist level: 2  Helpers     Locomotion Ambulation   Ambulation assist   Ambulation activity did not occur: Safety/medical concerns          Walk 10 feet activity   Assist  Walk 10 feet activity did not occur: Safety/medical concerns        Walk 50 feet activity   Assist Walk 50 feet with 2 turns activity did not occur: Safety/medical concerns         Walk 150 feet activity   Assist Walk 150 feet activity did not occur: Safety/medical concerns         Walk 10 feet on uneven surface  activity   Assist Walk 10 feet on uneven surfaces activity did not occur: Safety/medical concerns         Wheelchair     Assist Is the patient using a wheelchair?: Yes Type of Wheelchair: Manual    Wheelchair assist level: Dependent - Patient 0% Max wheelchair distance: 150 ft    Wheelchair 50 feet with 2 turns activity    Assist        Assist Level: Dependent - Patient 0%   Wheelchair 150 feet activity     Assist      Assist Level: Dependent - Patient 0%   Blood pressure 110/74, pulse 89, temperature 98 F (36.7 C), temperature source Oral, resp. rate 16, height 6\' 1"  (1.854 m), weight 106.5 kg, SpO2 98%.  Medical Problem List and Plan: 1. 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  Antithrombotics: -DVT/anticoagulation:  Mechanical: Sequential compression devices, below knee Bilateral lower extremities             -antiplatelet therapy: N/A--no antiplatelet due to hx of recurrent GIB 3. Pain Management:  oxycodone  prn. Gabapentin   300 mg/hs.              --has Flexeril  10 mg  TID prn              - patient with intermittent, severe b/l headache - add topomax 25 mg BID    4/19- pt rating pain 07/19/08 which is "tolerable"  4/20- no change in pain 4. Mood/Behavior/Sleep: LCSW to follow for evaluation and support             -antipsychotic agents: N/A             --melatonin prn for insomnia.  5. Neuropsych/cognition: This patient is capable of making decisions on his own behalf. 6. Skin/Wound Care: Routine pressure relief measures.   4/19- due to ulcers on legs, on low air loss mattress 7. Fluids/Electrolytes/Nutrition: Strict I/O. Daily weights. Labs with HD. 8. ESRD- HD MWF: Schedule HD at the end of the day to help with tolerance of therapy             --Sensipar  and Renvela  for metabolic bone disease.   - Patient reports no longer makes urine  4/20- pt getting HD this AM- last HD was Wednesday.   4/21 dialysis moved to tomorrow 9. Anemia of chronic disease: Monitor H/H  -4/21 HGB lower 7.5, FOBT, recheck tomorrow 10. H/o recurrent GIB/Jehovah's Witness: Continue Protonix  BID 11. H/o Hypotension: Has had drop  in BP with HD. Now cervical SCI.  --Monitor for symptoms day after HD and with activity.  -4/18 baseline blood pressure stable, monitor with activity 4/19- BP actually slightly elevated today- 140s-150's- con't regimen, since can drop with HD and therapy 4/20- BP actually doing better today- in 100s'110s- however HR elevated, likely to fluid overload 4/21 nephrology started midodrine  before dialysis, BP appears to be doing a little better today    06/03/2023   12:00 PM 06/03/2023   10:00 AM 06/03/2023    7:00 AM  Vitals with BMI  Systolic 110 93 93  Diastolic 74 55 62  Pulse 89 94 85    12. Constipation: Small BM on 04/14 --had of nulytey yesterday and another today?  -Xray 4/17 without excessive stool, patient reports stool still little hard yesterday.  Will increase MiraLAX  to 3 times a day, continue Senokot twice a day. Suppository PRN    4/19- had stool last night- large soft stool  4/21 LBM last night 13. ?left knee effusion. Not currently bothersome. Consider imaging.  14.  SIRS- Fever/hypotension/tachycardia  - 4/21 Currently afebrile, he got to 50 cc IVF yesterday, CXR negative, EKG sinus tachycardia. Patient was started on Maxipime  and vancomycin  yesterday.  Appears to be doing much better today.  WBC not elevated at 7.1.  Continue to follow blood cultures       LOS: 4 days A FACE TO FACE EVALUATION WAS PERFORMED  Lylia Sand 06/03/2023, 4:51 PM

## 2023-06-04 DIAGNOSIS — S14109S Unspecified injury at unspecified level of cervical spinal cord, sequela: Secondary | ICD-10-CM | POA: Diagnosis not present

## 2023-06-04 LAB — CBC
HCT: 27.8 % — ABNORMAL LOW (ref 39.0–52.0)
Hemoglobin: 8.7 g/dL — ABNORMAL LOW (ref 13.0–17.0)
MCH: 28.2 pg (ref 26.0–34.0)
MCHC: 31.3 g/dL (ref 30.0–36.0)
MCV: 90 fL (ref 80.0–100.0)
Platelets: 307 10*3/uL (ref 150–400)
RBC: 3.09 MIL/uL — ABNORMAL LOW (ref 4.22–5.81)
RDW: 15.6 % — ABNORMAL HIGH (ref 11.5–15.5)
WBC: 7.3 10*3/uL (ref 4.0–10.5)
nRBC: 0 % (ref 0.0–0.2)

## 2023-06-04 MED ORDER — HEPARIN SODIUM (PORCINE) 1000 UNIT/ML DIALYSIS
3000.0000 [IU] | Freq: Once | INTRAMUSCULAR | Status: AC
  Administered 2023-06-04: 3000 [IU] via INTRAVENOUS_CENTRAL
  Filled 2023-06-04 (×2): qty 3

## 2023-06-04 MED ORDER — PENTAFLUOROPROP-TETRAFLUOROETH EX AERO: 1.0000 | INHALATION_SPRAY | CUTANEOUS | Status: DC | PRN

## 2023-06-04 MED ORDER — CALCITRIOL 0.5 MCG PO CAPS
1.5000 ug | ORAL_CAPSULE | Freq: Once | ORAL | Status: AC
  Administered 2023-06-04: 1.5 ug via ORAL
  Filled 2023-06-04 (×2): qty 3

## 2023-06-04 MED ORDER — DARBEPOETIN ALFA 100 MCG/0.5ML IJ SOSY
100.0000 ug | PREFILLED_SYRINGE | INTRAMUSCULAR | Status: DC
  Administered 2023-06-04: 100 ug via SUBCUTANEOUS
  Filled 2023-06-04: qty 0.5

## 2023-06-04 MED ORDER — HEPARIN SODIUM (PORCINE) 1000 UNIT/ML DIALYSIS
1000.0000 [IU] | INTRAMUSCULAR | Status: DC | PRN
  Filled 2023-06-04: qty 1

## 2023-06-04 MED ORDER — LIDOCAINE HCL (PF) 1 % IJ SOLN: 5.0000 mL | INTRAMUSCULAR | Status: DC | PRN

## 2023-06-04 MED ORDER — ANTICOAGULANT SODIUM CITRATE 4% (200MG/5ML) IV SOLN
5.0000 mL | Status: DC | PRN
  Filled 2023-06-04: qty 5

## 2023-06-04 MED ORDER — VANCOMYCIN HCL IN DEXTROSE 1-5 GM/200ML-% IV SOLN
1000.0000 mg | INTRAVENOUS | Status: AC
  Administered 2023-06-04: 1000 mg via INTRAVENOUS
  Filled 2023-06-04 (×2): qty 200

## 2023-06-04 MED ORDER — GERHARDT'S BUTT CREAM
TOPICAL_CREAM | Freq: Two times a day (BID) | CUTANEOUS | Status: DC
  Administered 2023-06-12 – 2023-06-13 (×2): 1 via TOPICAL
  Filled 2023-06-04: qty 60

## 2023-06-04 MED ORDER — ALTEPLASE 2 MG IJ SOLR
2.0000 mg | Freq: Once | INTRAMUSCULAR | Status: DC | PRN
  Filled 2023-06-04: qty 2

## 2023-06-04 MED ORDER — LIDOCAINE-PRILOCAINE 2.5-2.5 % EX CREA
1.0000 | TOPICAL_CREAM | CUTANEOUS | Status: DC | PRN
  Filled 2023-06-04: qty 5

## 2023-06-04 NOTE — Progress Notes (Signed)
 [facility] Progress Note   Subjective:  Seen in room - upset that hasn't had HD "in 5 days" - reviewed chart, was dialyzed on Sunday 4/20. ?Confused. Plan IS for HD today - pt ok with that.  Objective Vitals:   06/03/23 2358 06/04/23 0421 06/04/23 0500 06/04/23 0750  BP: 117/61 (!) 91/55  120/69  Pulse: 92 96  100  Resp: 18 18    Temp: 98.3 F (36.8 C) 98.2 F (36.8 C)    TempSrc:      SpO2: 99% 99%    Weight:   107.6 kg   Height:       Physical Exam General: Well appearing, NAD. Neck brace in place. RA Heart: RRR Lungs: CTA anteriorly Abdomen: soft Extremities: no LE edema Dialysis Access: AVF +t/b  Additional Objective Labs: Basic Metabolic Panel: Recent Labs  Lab 05/29/23 2150 06/02/23 0840 06/03/23 0556  NA 130* 129* 131*  K 5.5* 5.2* 5.1  CL 92* 90* 92*  CO2 24 24 25   GLUCOSE 108* 104* 102*  BUN 47* 65* 48*  CREATININE 9.36* 10.62* 8.71*  CALCIUM  8.8* 7.6* 7.5*  PHOS 6.2* 5.8* 4.3   Liver Function Tests: Recent Labs  Lab 05/29/23 2150 06/02/23 0840 06/03/23 0556  ALBUMIN  2.9* 2.4* 2.1*   CBC: Recent Labs  Lab 05/29/23 2155 06/02/23 0840 06/03/23 0556 06/04/23 0507  WBC 8.2 7.0 7.1 7.3  NEUTROABS  --   --  4.4  --   HGB 9.3* 8.8* 7.5* 8.7*  HCT 29.5* 27.1* 23.7* 27.8*  MCV 90.2 90.0 90.1 90.0  PLT 266 308 271 307   Blood Culture    Component Value Date/Time   SDES BLOOD LEFT HAND 06/02/2023 1743   SDES BLOOD LEFT HAND 06/02/2023 1743   SPECREQUEST  06/02/2023 1743    BOTTLES DRAWN AEROBIC AND ANAEROBIC Blood Culture results may not be optimal due to an inadequate volume of blood received in culture bottles   SPECREQUEST  06/02/2023 1743    AEROBIC BOTTLE ONLY Blood Culture results may not be optimal due to an inadequate volume of blood received in culture bottles   CULT  06/02/2023 1743    NO GROWTH 2 DAYS Performed at [facility], [address], [place], [place] [address]    CULT  06/02/2023 1743    NO  GROWTH 2 DAYS Performed at [facility], [address], [place], [place] [address]    REPTSTATUS PENDING 06/02/2023 1743   REPTSTATUS PENDING 06/02/2023 1743   Studies/Results: DG CHEST PORT 1 VIEW Result Date: 06/02/2023 CLINICAL DATA:  Fever EXAM: PORTABLE CHEST 1 VIEW COMPARISON:  09/20/2021 FINDINGS: Right shoulder replacement. Cardiomegaly with aortic atherosclerosis. No acute airspace disease, pleural effusion or pneumothorax IMPRESSION: No active disease. Cardiomegaly. Electronically Signed   By: Esmeralda Hedge M.D.   On: 06/02/2023 20:20   Medications:  ceFEPime  (MAXIPIME ) IV 1 g (06/03/23 1930)   vancomycin       (feeding supplement) PROSource Plus  30 mL Oral BID BM   [START ON 06/06/2023] atorvastatin   10 mg Oral Q Thu   calcitRIOL   1.5 mcg Oral Once in dialysis   carbamide peroxide  5 drop Right EAR BID   Chlorhexidine  Gluconate Cloth  6 each Topical Q0600   Chlorhexidine  Gluconate Cloth  6 each Topical Q0600   Chlorhexidine  Gluconate Cloth  6 each Topical Q0600   cinacalcet   120 mg Oral Q supper   gabapentin   300 mg Oral QHS   Gerhardt's  butt cream   Topical BID   midodrine   10 mg Oral Once in dialysis   pantoprazole   40 mg Oral BID AC   polyethylene glycol  17 g Oral TID   psyllium  1 packet Oral TID   senna-docusate  2 tablet Oral BID   sevelamer  carbonate  3,200 mg Oral TID WC   topiramate   25 mg Oral BID   vancomycin  variable dose per unstable renal function (pharmacist dosing)   Does not apply See admin instructions    Dialysis Orders [facility] MWF 4h  B400   85.5kg   2K bath  AVF Heparin  3000 Mircera 60mcg IV q 2 weeks- last dose 05/24/23 Calcitriol  1.5mcg PO q HD  Assessment/Plan: 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 Now in CIR ESRD: Off schedule this week. S/p HD 4/20, for HD today, then back to usual MWF schedule. HTN/volume: BP stable, way up per weights. UF as tolerated. Anemia of ESRD: Hgb 8.7 - improving, start  Aranesp  today. Secondary HPTH: CorrCa/Phos ok - continue sevelamer , calcitriol , sensipar  for now. Nutrition: Alb low, continue proten supps.   Janus Mercury, PA-C 06/04/2023, 11:01 AM  [employer]

## 2023-06-04 NOTE — Progress Notes (Signed)
 Patient ID: Carl Schinke Sr., male   DOB: [birth year], 63 y.o.   MRN: [id number] Met with pt to give team conference update regarding goals of min level and barrier is the flight of stairs he lives up on the second floor-18 steps. Pt is upset with the renal MD regarding his dialysis and going four days without it. He has had dialysis for 15 years and knows how it should be. Contacted his son with pt's permission and to update him regarding team conference and progress. Made aware he will need physical assist at discharge and the issue with the flight of steps. Discussed having a family conference this Thursday to have team and MD there to discuss goals and progress and safe plan for him. Both in agreement with this plan. Have let team and MD know they would like a family conference. Have scheduled for Thursday at 10:00.

## 2023-06-04 NOTE — Progress Notes (Signed)
 Occupational Therapy Session Note  Patient Details  Name: Carl Mcenroe Sr. MRN: [id number] Date of Birth: [birth year]  Today's Date: 06/04/2023 OT Individual Time: 0835-0900 OT Individual Time Calculation (min): 25 min    Short Term Goals: Week 1:  OT Short Term Goal 1 (Week 1): Pt will perform toilet transfer with consistent Mod A (x1) + LRAD. OT Short Term Goal 2 (Week 1): Pt will thread BLE into LB garments with Min A + LRAD. OT Short Term Goal 3 (Week 1): Pt will bathe UB with Min A + LRAD.  Skilled Therapeutic Interventions/Progress Updates:    Pt received supine with no c/o pain, agreeable to OT session. Session focused on BLE strengthening circuit at supine level. OT facilitated sidelying positioning in R and  L- max A. He completed hip flexion and clam shells bilaterally 2x10 repetitions. He required only min facilitation on the R and mod-max facilitation at the LLE. He then transitioned to supine and completed alternating heel slides, 1x10 repetitions with manual resistance provided. All for carryover to increased independence for ADL transfers. Pt left supine with all needs met.   Therapy Documentation Precautions:  Precautions Precautions: Fall, Cervical, Other (comment) (HD MWF (RUE restricted).) Recall of Precautions/Restrictions: Impaired Precaution/Restrictions Comments: Verbally reviewed cervical precautions. Required Braces or Orthoses: Cervical Brace Cervical Brace: Hard collar Restrictions Weight Bearing Restrictions Per Provider Order: No  Therapy/Group: Individual Therapy  Una Ganser 06/04/2023, 6:31 AM

## 2023-06-04 NOTE — Progress Notes (Signed)
 Patient off the unit for dilaysis   Randeen Busman, LPN

## 2023-06-04 NOTE — Progress Notes (Signed)
 Received patient in bed to unit.  Alert and oriented.  Informed consent signed and in chart.   TX duration: 3 hours  Patient tolerated well.  Transported back to the room  Alert, without acute distress.  Hand-off given to patient's nurse.   Access used: R Forearm AV Fistula Access issues: none  Total UF removed: 2L Medication(s) given: Calcitriol , Vancomycin    06/04/23 1620  Vitals  Temp 98.3 F (36.8 C)  Temp Source Oral  BP 138/81  MAP (mmHg) 99  BP Location Left Arm  BP Method Automatic  Patient Position (if appropriate) Lying  Pulse Rate 94  Pulse Rate Source Monitor  ECG Heart Rate 96  Resp 13  Oxygen Therapy  SpO2 100 %  O2 Device Room Air  During Treatment Monitoring  Duration of HD Treatment -hour(s) 3 hour(s)  Cumulative Fluid Removed (mL) per Treatment  2000.2  HD Safety Checks Performed Yes  Intra-Hemodialysis Comments Tx completed  Dialysis Fluid Bolus Normal Saline  Bolus Amount (mL) 300 mL  Post Treatment  Dialyzer Clearance Clear  Liters Processed 72  Fluid Removed (mL) 2000 mL  Tolerated HD Treatment Yes  AVG/AVF Arterial Site Held (minutes) 7 minutes  AVG/AVF Venous Site Held (minutes) 7 minutes  Fistula / Graft Right Forearm Arteriovenous fistula  Placement Date/Time: 07/16/17 (c) 1158   Placed prior to admission: No  Orientation: Right  Access Location: Forearm  Access Type: Arteriovenous fistula  Status Deaccessed     Luciano Ruths LPN [facility]

## 2023-06-04 NOTE — Progress Notes (Signed)
 Physical Therapy Session Note   Patient Details  Name: Carl Demonte Sr. MRN: [id number] Date of Birth: 09/15/1960   Today's Date: 06/04/2023  PT missed minutes: 75 min Reason: off unit for HD  Pt off unit at time of scheduled therapy appointment, missed x 75 min of skilled therapy. Will attempt to make up as schedule allows.

## 2023-06-04 NOTE — Progress Notes (Signed)
 Patient back on the unit from dialysis.  Carl Busman, LPN

## 2023-06-04 NOTE — Progress Notes (Signed)
 Orthopedic Tech Progress Note Patient Details:  Carl Clutter Sr. 06-03-1960 [id number]  Called in order to [employer] for a pair of XL PRAFO BOOTS  Patient ID: [name], male   DOB: September 18, 1960, 64 y.o.   MRN: [id number]  Kermitt Pedlar 06/04/2023, 12:16 PM

## 2023-06-04 NOTE — Progress Notes (Signed)
 PROGRESS NOTE   Subjective/Complaints:  Pt reports as well as nursing to get HD this AM- did not get as scheduled yesterday.   Slept well- feeling much better than Sunday.  Pain also better than weekend.  LBM yesterday.  Per nursing, pt's buttocks are getting a little red- will order Gerhardt's   ROS:   Pt denies SOB, abd pain, CP, N/V/C/D, and vision changes   Objective:   DG CHEST PORT 1 VIEW Result Date: 06/02/2023 CLINICAL DATA:  Fever EXAM: PORTABLE CHEST 1 VIEW COMPARISON:  09/20/2021 FINDINGS: Right shoulder replacement. Cardiomegaly with aortic atherosclerosis. No acute airspace disease, pleural effusion or pneumothorax IMPRESSION: No active disease. Cardiomegaly. Electronically Signed   By: Esmeralda Hedge M.D.   On: 06/02/2023 20:20    Recent Labs    06/03/23 0556 06/04/23 0507  WBC 7.1 7.3  HGB 7.5* 8.7*  HCT 23.7* 27.8*  PLT 271 307   Recent Labs    06/02/23 0840 06/03/23 0556  NA 129* 131*  K 5.2* 5.1  CL 90* 92*  CO2 24 25  GLUCOSE 104* 102*  BUN 65* 48*  CREATININE 10.62* 8.71*  CALCIUM  7.6* 7.5*    Intake/Output Summary (Last 24 hours) at 06/04/2023 0834 Last data filed at 06/04/2023 0816 Gross per 24 hour  Intake 717 ml  Output 110 ml  Net 607 ml        Physical Exam: Vital Signs Blood pressure 120/69, pulse 100, temperature 98.2 F (36.8 C), resp. rate 18, height 6\' 1"  (1.854 m), weight 107.6 kg, SpO2 99%.     General: awake, alert, appropriate, sitting up slightly in bed; NAD HENT: conjugate gaze; oropharynx moist CV: regular rhythm, borderline tachycardic rate; no JVD Pulmonary: CTA B/L; no W/R/R- good air movement GI: soft, NT, ND, (+)BS- normoactive Psychiatric: appropriate- more interactive Neurological: Ox3    Skin: C/D/I. Multiple lesions on BL legs with various stages of healing.  + RUE fistula - intact   MSK:      LLE knee with palpable effusion, no warmth, no  ttp Mood: Pleasant affect, appropriate mood.  Neurologic exam: Alert and oriented x 4, fluent, follows commands, cranial nerves II through XII grossly intact, no hypertonia noted  Sensation: Reduced to light touch in distal right lower extremity to the mid-thigh       Strength:                RUE: 2/5 SA, 2/5 EF, 2/5 EE, 2/5 WE, 2/5 FF, 2/5 FA                LUE:  3/5 SA, 3/5 EF, 3/5 EE, 3/5 WE, 3/5 FF, 3/5 FA                RLE: 2/5 HF, 2/5 KE, 2/5  DF, 2/5  EHL, 2/5  PF                 LLE:  3/5 HF, 4/5 KE, 4/5  DF, 4/5  EHL, 4/5  PF  Neuroexam stable 4/21  Assessment/Plan: 1. Functional deficits which require 3+ hours per day of interdisciplinary therapy in a comprehensive inpatient rehab setting. Physiatrist is providing close  team supervision and 24 hour management of active medical problems listed below. Physiatrist and rehab team continue to assess barriers to discharge/monitor patient progress toward functional and medical goals  Care Tool:  Bathing    Body parts bathed by patient: Right arm, Chest, Abdomen, Right upper leg, Left upper leg, Face   Body parts bathed by helper: Left arm, Front perineal area, Buttocks, Right lower leg, Left lower leg     Bathing assist Assist Level: Moderate Assistance - Patient 50 - 74%     Upper Body Dressing/Undressing Upper body dressing   What is the patient wearing?: Pull over shirt    Upper body assist Assist Level: Moderate Assistance - Patient 50 - 74%    Lower Body Dressing/Undressing Lower body dressing      What is the patient wearing?: Pants     Lower body assist Assist for lower body dressing: Maximal Assistance - Patient 25 - 49%     Toileting Toileting Toileting Activity did not occur (Clothing management and hygiene only): N/A (no void or bm) (anuric, HD patient)  Toileting assist Assist for toileting: Maximal Assistance - Patient 25 - 49%     Transfers Chair/bed transfer  Transfers assist     Chair/bed  transfer assist level: 2 Helpers     Locomotion Ambulation   Ambulation assist   Ambulation activity did not occur: Safety/medical concerns          Walk 10 feet activity   Assist  Walk 10 feet activity did not occur: Safety/medical concerns        Walk 50 feet activity   Assist Walk 50 feet with 2 turns activity did not occur: Safety/medical concerns         Walk 150 feet activity   Assist Walk 150 feet activity did not occur: Safety/medical concerns         Walk 10 feet on uneven surface  activity   Assist Walk 10 feet on uneven surfaces activity did not occur: Safety/medical concerns         Wheelchair     Assist Is the patient using a wheelchair?: Yes Type of Wheelchair: Manual    Wheelchair assist level: Dependent - Patient 0% Max wheelchair distance: 150 ft    Wheelchair 50 feet with 2 turns activity    Assist        Assist Level: Dependent - Patient 0%   Wheelchair 150 feet activity     Assist      Assist Level: Dependent - Patient 0%   Blood pressure 120/69, pulse 100, temperature 98.2 F (36.8 C), resp. rate 18, height 6\' 1"  (1.854 m), weight 107.6 kg, SpO2 99%.  Medical Problem List and Plan: 1. 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  Antithrombotics: -DVT/anticoagulation:  Mechanical: Sequential compression devices, below knee Bilateral lower extremities             -  antiplatelet therapy: N/A--no antiplatelet due to hx of recurrent GIB 3. Pain Management:  oxycodone  prn.  Gabapentin  300 mg/hs.              --has Flexeril  10 mg TID prn              - patient with intermittent, severe b/l headache - add topomax 25 mg BID    4/19- pt rating pain 07/19/08 which is "tolerable"  4/22- pain slightly better this AM 4. Mood/Behavior/Sleep: LCSW to follow for evaluation and support             -antipsychotic agents: N/A             --melatonin prn for insomnia.  5. Neuropsych/cognition: This patient is capable of making decisions on his own behalf. 6. Skin/Wound Care: Routine pressure relief measures.   4/19- due to ulcers on legs, on low air loss mattress 7. Fluids/Electrolytes/Nutrition: Strict I/O. Daily weights. Labs with HD. 8. ESRD- HD MWF: Schedule HD at the end of the day to help with tolerance of therapy             --Sensipar  and Renvela  for metabolic bone disease.   - Patient reports no longer makes urine  4/20- pt getting HD this AM- last HD was Wednesday.   4/21 dialysis moved to tomorrow  4/22- HD to be done today 9. Anemia of chronic disease: Monitor H/H  -4/21 HGB lower 7.5, FOBT, recheck tomorrow  4/22- HB 8.7 on today's check-con't to monitor 10. H/o recurrent GIB/Jehovah's Witness: Continue Protonix  BID 11. H/o Hypotension: Has had drop  in BP with HD. Now cervical SCI.  --Monitor for symptoms day after HD and with activity.  -4/18 baseline blood pressure stable, monitor with activity 4/19- BP actually slightly elevated today- 140s-150's- con't regimen, since can drop with HD and therapy 4/20- BP actually doing better today- in 100s'110s- however HR elevated, likely to fluid overload 4/21 nephrology started midodrine  before dialysis, BP appears to be doing a little better today 4/22- less hypotensive this AM laying- will monitor for [facility]    06/04/2023    7:50 AM 06/04/2023    5:00 AM 06/04/2023    4:21 AM  Vitals with BMI  Weight  237 lbs 3 oz   BMI  31.3   Systolic 120  91  Diastolic 69  55  Pulse 100  96    12. Constipation: Small BM on  04/14 --had of nulytey yesterday and another today?  -Xray 4/17 without excessive stool, patient reports stool still little hard yesterday.  Will increase MiraLAX  to 3 times a day, continue Senokot twice a day. Suppository PRN   4/19- had stool last night- large soft stool  4/21 LBM last night 13. ?left knee effusion. Not currently bothersome. Consider imaging.  14.  SIRS- Fever/hypotension/tachycardia  - 4/21 Currently afebrile, he got to 50 cc IVF yesterday, CXR negative, EKG sinus tachycardia. Patient was started on Maxipime  and vancomycin  yesterday.  Appears to be doing much better today.  WBC not elevated at 7.1.  Continue to follow blood cultures  4/22- Blood Cx's (-) so far- WBC 7.3- will con't IV ABX until know final results of blood Cx's- CXR (-)- but temp was 103.2 yesterday overnight.  15. Tachycardia  4/22- doing better- right at 100 this AM- will monitor- likely can be from dregs of infection vs OH. -will monitor   I spent a total of  39   minutes on total care  today- >50% coordination of care- due to  D/w nursing; also review of labs and vitals- as well as team conference to determine d/c date.        LOS: 5 days A FACE TO FACE EVALUATION WAS PERFORMED  Keon Pender 06/04/2023, 8:34 AM

## 2023-06-04 NOTE — Plan of Care (Signed)
  Problem: Consults Goal: RH SPINAL CORD INJURY PATIENT EDUCATION Description:  See Patient Education module for education specifics.  Outcome: Progressing   Problem: SCI BOWEL ELIMINATION Goal: RH STG MANAGE BOWEL WITH ASSISTANCE Description: STG Manage Bowel with minimal Assistance. Outcome: Progressing Goal: RH STG SCI MANAGE BOWEL WITH MEDICATION WITH ASSISTANCE Description: STG SCI Manage bowel with medication with assistance. Outcome: Progressing   Problem: RH SKIN INTEGRITY Goal: RH STG SKIN FREE OF INFECTION/BREAKDOWN Description: Manage skin free of infection/breakdown with minimal assistance Outcome: Progressing   Problem: RH SAFETY Goal: RH STG ADHERE TO SAFETY PRECAUTIONS W/ASSISTANCE/DEVICE Description: STG Adhere to Safety Precautions With minimal Assistance/Device. Outcome: Progressing   Problem: RH PAIN MANAGEMENT Goal: RH STG PAIN MANAGED AT OR BELOW PT'S PAIN GOAL Description: Pain<4 w/ prns Outcome: Progressing   Problem: RH KNOWLEDGE DEFICIT SCI Goal: RH STG INCREASE KNOWLEDGE OF SELF CARE AFTER SCI Description: Manage increase knowledge of self care after SCI with minimal assistance using educational materials provided to son Outcome: Progressing

## 2023-06-04 NOTE — Progress Notes (Signed)
 Physical Therapy Session Note  Patient Details  Name: Carl Ackert Sr. MRN: [id number] Date of Birth: 20-Feb-1960  Today's Date: 06/04/2023 PT Individual Time: 1005-1105 PT Individual Time Calculation (min): 60 min   Short Term Goals: Week 1:  PT Short Term Goal 1 (Week 1): pt will perform STS with RW with assist of 1 PT Short Term Goal 2 (Week 1): Pt will perform stand pivot with LRAD with assist of 1 PT Short Term Goal 3 (Week 1): pt will initiate gait training  Skilled Therapeutic Interventions/Progress Updates:    Pt presents in bed initially stating he isn't sure what the plan for today is and thought someone told him something about surgery today. Per chart review only saw that the plan was for HD today that is unplanned from his normal schedule - checked and verified with pt's RN and PA and she came in and spoke to him as well. (Plan for RUE sx is delayed due to new hospitalization). Pt verbalized understanding though does continue to report frustration with HD scheduling changes.   Focused on functional bed mobility though limited by bed features not working properly and pt being stuck in the crack of the air mattress when trying to sit upright - required mod +2 for assist with RLE and then for trunk. Mild LOB posterior with up to mod assist needed to recover when attempting to scoot forward. Utilized Stedy for sit > stand x 3 trials total with mod to max +2 for sit > stand from elevated bed (bed does not lower and this caused issue with the pads for the Stedy too when elevated) with cues for upright trunk, facilitation of glute activation and knee extension and postural control retraining. Transferred via Stedy with +2 to w/c (TIS). Pt would benefit from elevating leg rests due to length of his legs but those do not fit this current chair. Used gait belt at end of session to position BLE in neutral hip alignment as there are no supportive abduction pads on this model.  Continued NMR with  use of [name] for sit to stands and standing tolerance in standing with focus on BLE weightbearing and activation, trunk control, and postural control retraining. Pt required external assist for maintaining positioning of RUE and RLE (for initial sit > stand). Pt able to tolerate about 2-3 min each trial and progressed to dynamic movement of BUE. Recommended to use this lift for nursing if pt unable to perform sit > stand with Stedy from lower surface or fatigued. Modified safety plan and verbalized to nursing staff.  Pt denies any signs or symptoms of orthostasis with upright mobility this session.   Therapy Documentation Precautions:  Precautions Precautions: Fall, Cervical, Other (comment) (HD MWF (RUE restricted).) Recall of Precautions/Restrictions: Impaired Precaution/Restrictions Comments: Verbally reviewed cervical precautions. Required Braces or Orthoses: Cervical Brace Cervical Brace: Hard collar Restrictions Weight Bearing Restrictions Per Provider Order: No General:   Vital Signs: BP = 105/66 mmHg; HR = 99 bpm (HOB elevated) with Tedhose and ACE wraps donned Pain:  Reports pain is ok at the moment, no complaints.     Therapy/Group: Individual Therapy  Gita Lamb Amadeo June, PT, DPT, CBIS  06/04/2023, 11:15 AM

## 2023-06-04 NOTE — Progress Notes (Signed)
 Occupational Therapy Session Note  Patient Details  Name: Carl Turano Sr. MRN: [id number] Date of Birth: 28-Jan-1961  Today's Date: 06/04/2023 OT Individual Time: 4098-1191 OT Individual Time Calculation (min): 30 min  and Today's Date: 06/04/2023 OT Missed Time: 45 Minutes Missed Time Reason: Unavailable (comment) (pt at dialysis)   Short Term Goals: Week 1:  OT Short Term Goal 1 (Week 1): Pt will perform toilet transfer with consistent Mod A (x1) + LRAD. OT Short Term Goal 2 (Week 1): Pt will thread BLE into LB garments with Min A + LRAD. OT Short Term Goal 3 (Week 1): Pt will bathe UB with Min A + LRAD.  Skilled Therapeutic Interventions/Progress Updates:      Therapy Documentation Precautions:  Precautions Precautions: Fall, Cervical, Other (comment) (HD MWF (RUE restricted).) Recall of Precautions/Restrictions: Impaired Precaution/Restrictions Comments: Verbally reviewed cervical precautions. Required Braces or Orthoses: Cervical Brace Cervical Brace: Hard collar Restrictions Weight Bearing Restrictions Per Provider Order: No General: "I slept okay" Pt supine in bed upon OT arrival, agreeable to OT session.  Pain: no pain reported  ADL: OT providing skilled intervention on ADL retraining in order to increase independence with tasks and increase activity tolerance. Pt completed the following tasks at the current level of assist: Bed mobility: Mod A rolling Lt and Rt to manage pants using hand rails while rolling, OT managing RLE for rolling LB dressing: total A bed level rolling/slight bridging to manage over waist, pt requiring assistance to manage over feet Footwear: total A managing BLE thigh high TEDs and ACE wrapping  Pt supine in bed with bed alarm activated, 2 bed rails up, call light within reach and 4Ps assessed.   Therapy/Group: Individual Therapy  [name], OTD, OTR/L 06/04/2023, 4:22 PM

## 2023-06-05 ENCOUNTER — Inpatient Hospital Stay (HOSPITAL_COMMUNITY)

## 2023-06-05 DIAGNOSIS — S14109S Unspecified injury at unspecified level of cervical spinal cord, sequela: Secondary | ICD-10-CM

## 2023-06-05 DIAGNOSIS — F54 Psychological and behavioral factors associated with disorders or diseases classified elsewhere: Secondary | ICD-10-CM | POA: Diagnosis not present

## 2023-06-05 MED ORDER — VANCOMYCIN HCL IN DEXTROSE 1-5 GM/200ML-% IV SOLN
1000.0000 mg | INTRAVENOUS | Status: DC
  Filled 2023-06-05: qty 200

## 2023-06-05 MED ORDER — CALCITRIOL 0.5 MCG PO CAPS
1.5000 ug | ORAL_CAPSULE | ORAL | Status: DC
  Administered 2023-06-07 – 2023-06-10 (×2): 1.5 ug via ORAL
  Filled 2023-06-05 (×3): qty 3

## 2023-06-05 NOTE — Plan of Care (Signed)
  Problem: Consults Goal: RH SPINAL CORD INJURY PATIENT EDUCATION Description:  See Patient Education module for education specifics.  Outcome: Progressing   Problem: SCI BOWEL ELIMINATION Goal: RH STG MANAGE BOWEL WITH ASSISTANCE Description: STG Manage Bowel with minimal Assistance. Outcome: Progressing Goal: RH STG SCI MANAGE BOWEL WITH MEDICATION WITH ASSISTANCE Description: STG SCI Manage bowel with medication with assistance. Outcome: Progressing   Problem: RH SKIN INTEGRITY Goal: RH STG SKIN FREE OF INFECTION/BREAKDOWN Description: Manage skin free of infection/breakdown with minimal assistance Outcome: Progressing   Problem: RH SAFETY Goal: RH STG ADHERE TO SAFETY PRECAUTIONS W/ASSISTANCE/DEVICE Description: STG Adhere to Safety Precautions With minimal Assistance/Device. Outcome: Progressing   Problem: RH KNOWLEDGE DEFICIT SCI Goal: RH STG INCREASE KNOWLEDGE OF SELF CARE AFTER SCI Description: Manage increase knowledge of self care after SCI with minimal assistance using educational materials provided to son Outcome: Progressing

## 2023-06-05 NOTE — Progress Notes (Signed)
   06/05/23 2254  BiPAP/CPAP/SIPAP  Reason BIPAP/CPAP not in use Non-compliant  BiPAP/CPAP /SiPAP Vitals  Pulse Rate 96  Resp 20  BP (!) 151/93  SpO2 100 %

## 2023-06-05 NOTE — Progress Notes (Signed)
  [facility] Progress Note   Subjective:  Seen in room - working with therapy. No CP/dyspnea. Febrile overnight per notes.   Objective Vitals:   06/04/23 1858 06/05/23 0500 06/05/23 0543 06/05/23 0722  BP: (!) 143/79  109/66   Pulse: 96  98   Resp: 18  19   Temp: 98.7 F (37.1 C)  (!) 100.4 F (38 C) 99.9 F (37.7 C)  TempSrc: Oral   Oral  SpO2: 100%  97%   Weight:  110 kg    Height:       Physical Exam General: Well appearing, NAD. Neck brace in place. RA Heart: RRR Lungs: CTA anteriorly Abdomen: soft Extremities: no LE edema Dialysis Access: AVF +t/b   Additional Objective Labs: Basic Metabolic Panel: Recent Labs  Lab 05/29/23 2150 06/02/23 0840 06/03/23 0556  NA 130* 129* 131*  K 5.5* 5.2* 5.1  CL 92* 90* 92*  CO2 24 24 25   GLUCOSE 108* 104* 102*  BUN 47* 65* 48*  CREATININE 9.36* 10.62* 8.71*  CALCIUM  8.8* 7.6* 7.5*  PHOS 6.2* 5.8* 4.3   Liver Function Tests: Recent Labs  Lab 05/29/23 2150 06/02/23 0840 06/03/23 0556  ALBUMIN  2.9* 2.4* 2.1*   CBC: Recent Labs  Lab 05/29/23 2155 06/02/23 0840 06/03/23 0556 06/04/23 0507  WBC 8.2 7.0 7.1 7.3  NEUTROABS  --   --  4.4  --   HGB 9.3* 8.8* 7.5* 8.7*  HCT 29.5* 27.1* 23.7* 27.8*  MCV 90.2 90.0 90.1 90.0  PLT 266 308 271 307   Medications:  ceFEPime  (MAXIPIME ) IV 1 g (06/04/23 1727)    (feeding supplement) PROSource Plus  30 mL Oral BID BM   [START ON 06/06/2023] atorvastatin   10 mg Oral Q Thu   carbamide peroxide  5 drop Right EAR BID   Chlorhexidine  Gluconate Cloth  6 each Topical Q0600   Chlorhexidine  Gluconate Cloth  6 each Topical Q0600   Chlorhexidine  Gluconate Cloth  6 each Topical Q0600   cinacalcet   120 mg Oral Q supper   darbepoetin (ARANESP ) injection - DIALYSIS  100 mcg Subcutaneous Q Tue-1800   gabapentin   300 mg Oral QHS   Gerhardt's butt cream   Topical BID   midodrine   10 mg Oral Once in dialysis   pantoprazole   40 mg Oral BID AC   polyethylene glycol  17 g  Oral TID   psyllium  1 packet Oral TID   senna-docusate  2 tablet Oral BID   sevelamer  carbonate  3,200 mg Oral TID WC   topiramate   25 mg Oral BID   vancomycin  variable dose per unstable renal function (pharmacist dosing)   Does not apply See admin instructions    Dialysis Orders [facility] - MWF 4h  B400   85.5kg   2K bath  AVF Heparin  3000 Mircera 60mcg IV q 2 weeks- last dose 05/24/23 Calcitriol  1.5mcg PO q HD   Assessment/Plan: 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 Now in CIR ESRD: Off schedule this week. S/p HD 4/20, 4/22. Will plan HD tomorrow, then back to usual schedule. HTN/volume: BP stable, way up per weights. UF as tolerated. Anemia of ESRD: Hgb 8.7 - improving, continue Aranesp  q Tues while here. Secondary HPTH: CorrCa/Phos ok - continue sevelamer , calcitriol , sensipar  for now. Nutrition: Alb low, continue protein supps.   Janus Mercury, PA-C 06/05/2023, 10:03 AM  [employer]

## 2023-06-05 NOTE — Progress Notes (Signed)
 Physical Therapy Session Note  Patient Details  Name: Carl Bienvenue Sr. MRN: [id number] Date of Birth: 08/25/1960  Today's Date: 06/05/2023 PT Individual Time: 2952-8413 PT Individual Time Calculation (min): 70 min   Short Term Goals: Week 1:  PT Short Term Goal 1 (Week 1): pt will perform STS with RW with assist of 1 PT Short Term Goal 2 (Week 1): Pt will perform stand pivot with LRAD with assist of 1 PT Short Term Goal 3 (Week 1): pt will initiate gait training  Skilled Therapeutic Interventions/Progress Updates: Pt presented in TIS agreeable to therapy. Pt states unrated pain, no intervention required during session. Discussed use of standing frame for standing tolerance and pre-gait activities with pt agreeable. Prior to leaving room  PA from nephrology arrived to discuss HD schedule. Pt transported to day room for time management with BP checked prior to standing frame set up 105/67. Due to lower nature of BP pt participated in seated exercises prior to use of standing frame. Pt performed LAQ with 3lb weight on LLE and LAQ with PTA assisting completing range RLE 2 x 10. Performed hip flexion in same manner A/AROM 2 x 10. Pt also participated in Cybex Kinetron 50cm/sec 2 x 2 min bouts. Pt did require min cues for increased extension in LLE. BP rechecked 111/76(88) HR 97. Pt then set up in standing frame with pt tolerating a total 10 min with no s/s OH. While in standing pt worked on Automatic Data via lateral weight shifting, TKE x 10 (required increased tapping for facilitation on RLE), mini squats from slackened sling requiring minA for increased hip extension. Pt returned to sitting after x 10 min and BP checked 119/70 (86) HR 96. Pt transported back to room at end of session and remained in TIS with call bell within reach and current needs met.      Therapy Documentation Precautions:  Precautions Precautions: Fall, Cervical, Other (comment) (HD MWF (RUE restricted).) Recall of  Precautions/Restrictions: Impaired Precaution/Restrictions Comments: Verbally reviewed cervical precautions. Required Braces or Orthoses: Cervical Brace Cervical Brace: Hard collar Restrictions Weight Bearing Restrictions Per Provider Order: No General:   Vital Signs: Therapy Vitals Temp: 98.4 F (36.9 C) Temp Source: Oral Pulse Rate: 95 Resp: 18 BP: 138/80 Patient Position (if appropriate): Lying Oxygen Therapy SpO2: 100 % O2 Device: Room Air     Therapy/Group: Individual Therapy  Priscillia Fouch 06/05/2023, 4:07 PM

## 2023-06-05 NOTE — Progress Notes (Signed)
 Patient ID: Carl Gomes Sr., male   DOB: 11/24/1960, 63 y.o.   MRN: [id number]  Met with pt to let know family conference tomorrow at 10:00 was unable to yesterday due to in dialysis. He reports he realizes it will be slow and is not happy about this.See tomorrow.

## 2023-06-05 NOTE — Patient Care Conference (Signed)
 Inpatient RehabilitationTeam Conference and Plan of Care Update Date: 06/04/2023   Time: 1141am    Patient Name: Carl Siegert Sr.      Medical Record Number: [id number]  Date of Birth: 23-Mar-1960 Sex: Male         Room/Bed: 4M13C/4M13C-01 Payor Info: Payor: Advertising copywriter MEDICARE / Plan: Sandford Croon DUAL COMPLETE / Product Type: *No Product type* /    Admit Date/Time:  05/30/2023  2:11 PM  Primary Diagnosis:  Spinal cord injury, cervical region, sequela [facility])  Hospital Problems: Principal Problem:   Spinal cord injury, cervical region, sequela (HCC) Active Problems:   Coping style affecting medical condition    Expected Discharge Date: Expected Discharge Date:  (4 weeks)  Team Members Present: Physician leading conference: Dr. Celia Coles Social Worker Present: Adrianna Albee, LCSW Nurse Present: Jerene Monks, RN PT Present: Gita Lamb, PT OT Present: [name], OT     Current Status/Progress Goal Weekly Team Focus  Bowel/Bladder   continent of bowel, anuric of bladder   no accidents   bowel and bladder training    Swallow/Nutrition/ Hydration      \         ADL's   total A LB ADLs, stedy +2 transfers, increased use of LUE for feeding and grooming with built up handles   Min A overall   transfer training, BUE FMC, basic ADL retraining    Mobility   +2 with Stedy or Abraham Hoffmann Plus for transfers, limited therapy due to missing time for unplanned HD   min assist overall for transfers, gait, and stairs, supervision w/c mobility  progressing transfers, upright tolerance, NMR for balance retraining    Communication                Safety/Cognition/ Behavioral Observations               Pain   8/10 back pain   pain free   deep breathing and coughing exercises    Skin   intact with redness at the buttokds   free from skin breakdown  turning and positioning every 2 hours      Discharge Planning:  HOme with son who is self employed unsre  if can provide 24/7 care, has flight of steps to go down to go to OP-HD. Will discuss best plan for him at DC    Team Discussion: Patient was admitted post Corpectomy with traumatic C4 SCI and on Hemodialysis. Patient with pain, fever: medications adjusted by MD. Patient limited by sleeping poorly, right hand swelling, hypotension and tachycardia.  Patient on target to meet rehab goals: yes, Patient requires total assist with ADLs. Patient requires Stedy for transfers with 2 assist. Overall goals at discharge are set for min A.  *See Care Plan and progress notes for long and short-term goals.   Revisions to Treatment Plan:  Family Conference Resting hand splint Built up handles for feeding and grooming  Teaching Needs: Safety, medications, dietary modifications, safety, toileting and transfers, etc.    Current Barriers to Discharge: Decreased caregiver support, Home enviroment access/layout, and Hemodialysis  Possible Resolutions to Barriers: Family Education      Medical Summary Current Status: on IV ABX for  high fever of 103.2-  Barriers to Discharge: Behavior/Mood;Complicated Wound;Incontinence;Infection/IV Antibiotics;Medical stability;Weight bearing restrictions;Self-care education;Uncontrolled Pain;Renal Insufficiency/Failure;Neurogenic Bowel & Bladder;Electrolyte abnormality;Hypotension  Barriers to Discharge Comments: 18 steps to get into apt; neurogenic bowel; ESRD; total ADLs; can feed self with L hand- self limiting?; will need 24 hour care; needs  shoulder surgery as well; hypotension- [place]; Possible Resolutions to [employer] Focus: plan WHO R; gerhardts for buttocks- will schedule family conference?; using SARA stedy;  4 weeks   Continued Need for Acute Rehabilitation Level of Care: The patient requires daily medical management by a physician with specialized training in physical medicine and rehabilitation for the following reasons: Direction of a multidisciplinary  physical rehabilitation program to maximize functional independence : Yes Medical management of patient stability for increased activity during participation in an intensive rehabilitation regime.: Yes Analysis of laboratory values and/or radiology reports with any subsequent need for medication adjustment and/or medical intervention. : Yes   I attest that I was present, lead the team conference, and concur with the assessment and plan of the team.   Jerene Monks 06/04/2023, 1141 am

## 2023-06-05 NOTE — Progress Notes (Signed)
 Occupational Therapy Session Note  Patient Details  Name: Carl Venson Sr. MRN: [id number] Date of Birth: [birth year]  Today's Date: 06/05/2023 OT Individual Time: 7829-5621 & 1105-1205 OT Individual Time Calculation (min): 60 min & 60 min   Short Term Goals: Week 1:  OT Short Term Goal 1 (Week 1): Pt will perform toilet transfer with consistent Mod A (x1) + LRAD. OT Short Term Goal 2 (Week 1): Pt will thread BLE into LB garments with Min A + LRAD. OT Short Term Goal 3 (Week 1): Pt will bathe UB with Min A + LRAD.  Skilled Therapeutic Interventions/Progress Updates:      Therapy Documentation Precautions:  Precautions Precautions: Fall, Cervical, Other (comment) (HD MWF (RUE restricted).) Recall of Precautions/Restrictions: Impaired Precaution/Restrictions Comments: Verbally reviewed cervical precautions. Required Braces or Orthoses: Cervical Brace Cervical Brace: Hard collar Restrictions Weight Bearing Restrictions Per Provider Order: No Session 1 General: "I didn't sleep that good" Pt supine in bed upon OT arrival, agreeable to OT session.  Pain: no pain reported  ADL: OT providing skilled intervention on ADL retraining in order to increase independence with tasks and increase activity tolerance. Pt completed the following tasks at the current level of assist: Bed mobility: Mod A rolling Lt and Rt, Max A supine to sit with attempt at hooking method with OT education d/t RLE weakness, able to sit EOB with SBA Grooming/oral hygiene: Min A for thoroughness for washing face with LUE  UB dressing: Max A seated in W/C for increased trunk support, able to assist with threading BUE, increased assistance required for RUE and overhead LB dressing: total A bed level, pt able to assist with lifting LLE for OT to don pants, rolling Lt and Rt and slight bridging to manage over waist Footwear: total  A for thigh high TEDs and Ace wraps  Bathing: Max A overall, bed level for BLE and in W/C  for UE/trunk  Transfers: stedy +1 transfer from bed>W/C   Pt seated in W/C at end of session with W/C alarm donned, call light within reach and 4Ps assessed.    Session 2 General: "Thanks!" Pt seated in W/C upon OT arrival, agreeable to OT.  Pain: no pain reported  Exercises: OT providing skilled intervention with BUE for increased proprioceptive input, ROM and functional use for increased ADL participation. OT completed PROM for RUE in preparation for activities. Pt completed the following exercises with multiple trials: -forward/backward towel slides -add/abduction towel slides -AAROM fist with VC for mental imagery  -wrist flexion/extension   Pt seated in W/C at end of session with W/C alarm donned, call light within reach and 4Ps assessed.    Therapy/Group: Individual Therapy  [name], OTD, OTR/L 06/05/2023, 4:35 PM

## 2023-06-05 NOTE — Consult Note (Signed)
 Neuropsychological Consultation Comprehensive Inpatient Rehab   Patient:   Carl Scarano Sr.   DOB:   August 01, 1960  MR Number:  [id number]  Location:  [facility] [address] [place] [address] Dept: [phone] Loc: [phone]           Date of Service:   06/05/2023  Start Time:   1 PM End Time:   2 PM  Provider/Observer:  Chapman Commodore, Psy.D.       Clinical Neuropsychologist       Billing Code/Service: [phone]  Reason for Service:    Carl Spofford Sr. is a 63 year old male referred for neuropsychological consultation during his ongoing admission to the comprehensive inpatient rehabilitation unit.  Patient has a past medical history including end-stage renal disease with hemodialysis, gastritis, GERD, seizure history, hypertension, OSA, cerebral venous stenosis, CVA with out residual deficits, chronic headache.  Patient had developed chronic progressive numbness with pain bilateral upper extremities, right foot drop for 2 weeks and balance issues with fall.  Patient was unable to lift his legs and had weakness bilateral upper extremities and presented to emergency department for evaluation 05/25/2023.  MRI brain showed chronic left PCA stroke with left large mastoid and middle ear effusion.  CT spine subacute C4 burst fracture with severe canal stenosis superimposed on C4/5 and C5-6 advanced spondylosis with cord edema and evolving myelomalacia C3-C6.  Neurosurgery evaluated and patient taken to the OR emergently.  Patient is continue with hemodialysis and home gabapentin .  Patient was admitted to CIR due to functional deficits and decline.  During today's clinical visit the patient was oriented but with clear significant bilateral upper body extremity muscle deficits and coordination deficits.  The patient struggled getting the remote control to turn the sound off the TV and had significant difficulty managing  the remote control to where he could depress the correct button.  Patient did have movement in hands and displayed movements in his feet but these were limited without good fine motor control.  Patient had some dysarthria of speech.  Patient was aware of what has been going on and was oriented to being in the hospital and the events with his surgical interventions.  Patient described information in Dr. Gwendlyn Lemmings had provided him immediately before as well as after his neurosurgical interventions.  Patient describes pain and muscle deficits that are continued but notes he is gaining some increased control over his upper extremities each day.  Patient reports that this has been very stressful for him but he feels like he is managing his hospitalization and extended time on rehab well.  HPI for the current admission:    HPI:  Carl Caniglia Sr is a 63 year old male (Jehovah's witness but has accepted blood products in the past)  with history of ESRD-HD MWF, duodenitis/gastritis w/recurrent GIB, GERD, seizure, HTN, OSA, RCC s/p B-lap nephrectomies, cerebral venous stenosis, CVA w/o residual deficits, chronic HA who has had progressive numbness with pain BUE, right foot drop X 2 weeks and balance issues who fell due to knees giving away and struck his face. He was unable to lift his legs and had weakness of BUE and presented to ED for evaluation on 05/25/23. MRI brain showed chronic L-PCA stroke iwht large left mastoid and middle ear effusion. CT C spine showed subacute C4  burst fracture with severe canal stenosis superimposed on  C4/5 & C5-6  advanced spondylosis with cord edema and/or evolving myelomalacia C3-C6.  He was evaluated by Dr. Adonis Alamin, found to have tetraplegia BUE>BLE and was taken to OR emergently  for C4 anterior corpectomy with C3-C5 anterior fusion on 05/26/23 am. Post procedure HD ongoing and home gabapentin  resumed. PT/OT  has been working with patient who is showing improvement in balance at EOB with  close guard assist for safety and posterior lean. He requires +2 assist with Stedy for mobility and ADLs.    He was independent and driving PTA but having difficulty with RLE and RUE so was planning on stopping. CIR recommended due to functional decline.  Medical History:   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    Parathyroid  abnormality (HCC)    ectopic parathyroid  gland   Presence of arteriovenous fistula for hemodialysis, primary (HCC)    RUE PER PT RLE   Refusal of blood product    NO WHOLE BLOOD PROUCTS   Renal cell carcinoma (HCC)    s/p hand assisted laparoscopic bilateral nephrectomies 11/29/17, + RCC left   Secondary hyperparathyroidism (HCC)    Seizures (HCC)    one episode in past, due to" elevated Potassium" 08/02/20- "at least 4 years ago"   Sleep apnea    doesn't use CPAP anymore since weight loss   Stroke [facility])    no residual         Patient Active Problem List   Diagnosis Date Noted   Coping style affecting medical condition 06/05/2023   Spinal cord injury, cervical region, sequela (HCC) 05/30/2023   Cervical vertebral fracture (HCC) 05/25/2023   History of GI bleed 05/25/2023   C4 cervical fracture (HCC) 05/25/2023   Duodenitis    Gastritis and gastroduodenitis    Ischemic colitis (HCC)    Bright red blood per rectum    ABLA (acute blood loss anemia)    Gastric ulcer    Duodenal ulcer    Diverticulosis of colon without  hemorrhage    GI bleed 09/17/2021   Acute upper GI bleeding 09/11/2021   Mucosal abnormality of esophagus    Mucosal abnormality of stomach    Mucosal abnormality of duodenum    Upper GI bleed    Left hip pain 11/16/2020   Type 2 diabetes mellitus with other specified complication (HCC) 11/14/2020   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/14/2020   Primary osteoarthritis of left hip 10/07/2020   Status post reverse total arthroplasty of right shoulder 08/24/2020   Diverticulitis 03/08/2020   ESRD (end stage renal disease) (HCC) 03/08/2020   Polyneuropathy in diseases classified elsewhere (HCC) 03/08/2020   Pure hypercholesterolemia 03/08/2020   Sacral back pain 03/08/2020   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 11/10/2019   Acute gastric ulcer with hemorrhage    Acute GI bleeding 07/31/2019   Hypotension of hemodialysis 07/31/2019  Diverticulitis large intestine 07/31/2019   Mass of soft tissue of shoulder 05/26/2019   Awaiting organ transplant status 03/18/2019   Finding of cocaine in blood 02/25/2019   Anaphylactic shock, unspecified, sequela 11/21/2018   Hypercalcemia 10/28/2018   Diarrhea, unspecified 08/08/2018   Hyperkalemia 03/12/2018   Renal cell carcinoma of left kidney (HCC) 12/24/2017   Fluid overload, unspecified 07/06/2017   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   Other specified disorders of kidney and ureter 11/22/2016   Secondary hyperparathyroidism of renal origin (HCC) 10/23/2016   Headache disorder 08/21/2016   TIA (transient ischemic attack)    Essential hypertension    Seizure (HCC)    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    Lung nodule    Diverticulitis of cecum 07/21/2015   Diverticulitis of large intestine without perforation or abscess without bleeding    Right lower quadrant pain    Obstructive sleep apnea 02/08/2015   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   Diverticulosis 07/13/2014   Lower GI bleed  07/12/2014   Refusal of blood transfusions as patient is Jehovah's Witness    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   HLD (hyperlipidemia)    PRES (posterior reversible encephalopathy syndrome)    History of CVA (cerebrovascular accident) 05/23/2014   Anemia of chronic disease 05/23/2014   GERD (gastroesophageal reflux disease) 12/09/2012   Gastrointestinal bleeding 11/12/2012   Melena 11/12/2012   Other psychoactive substance dependence, uncomplicated (HCC) 10/08/2012   Iron deficiency anemia, unspecified 06/10/2012   Moderate protein-calorie malnutrition (HCC) 07/29/2009   Coagulation defect, unspecified (HCC) 08/29/2006   Type 2 diabetes mellitus with diabetic peripheral angiopathy without gangrene (HCC) 08/29/2006   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 stage renal disease (HCC) 08/02/2006    Behavioral Observation/Mental Status:   Carl Pray Sr.  presents as a 63 y.o.-year-old Right handed African American Male who appeared his stated age. his dress was Appropriate and he was Well Groomed and his manners were Appropriate to the situation.  his participation was indicative of Appropriate and Redirectable behaviors.  There were physical disabilities noted.  he displayed an appropriate level of cooperation and motivation.    Interactions:    Active Redirectable  Attention:   abnormal and attention span appeared shorter than expected for age  Memory:   within normal limits; recent and remote memory intact  Visuo-spatial:   not examined  Speech (Volume):  low  Speech:   normal; slurred  Thought Process:  Coherent and Relevant  Coherent  Though Content:  WNL; not suicidal and not homicidal  Orientation:   person, place, and situation  Judgment:   Fair  Planning:   Fair  Affect:    Appropriate  Mood:    Dysphoric  Insight:   Good  Intelligence:   normal  Family Med/Psych  History:  Family History  Problem Relation Age of Onset   Diabetes Father    Stroke Father    Hypertension Father    Uterine cancer Mother    Lupus Sister    Stroke Sister    Hypertension Sister    Anuerysm Brother        brain   Colon cancer Neg Hx    Esophageal cancer Neg Hx    Stomach cancer Neg Hx    Pancreatic cancer Neg Hx    Liver disease Neg Hx    Colon polyps Neg Hx    Rectal cancer  Neg Hx    Impression/DX:   Carl Manon Sr. is a 63 year old male referred for neuropsychological consultation during his ongoing admission to the comprehensive inpatient rehabilitation unit.  Patient has a past medical history including end-stage renal disease with hemodialysis, gastritis, GERD, seizure history, hypertension, OSA, cerebral venous stenosis, CVA with out residual deficits, chronic headache.  Patient had developed chronic progressive numbness with pain bilateral upper extremities, right foot drop for 2 weeks and balance issues with fall.  Patient was unable to lift his legs and had weakness bilateral upper extremities and presented to emergency department for evaluation 05/25/2023.  MRI brain showed chronic left PCA stroke with left large mastoid and middle ear effusion.  CT spine subacute C4 burst fracture with severe canal stenosis superimposed on C4/5 and C5-6 advanced spondylosis with cord edema and evolving myelomalacia C3-C6.  Neurosurgery evaluated and patient taken to the OR emergently.  Patient is continue with hemodialysis and home gabapentin .  Patient was admitted to CIR due to functional deficits and decline.  During today's clinical visit the patient was oriented but with clear significant bilateral upper body extremity muscle deficits and coordination deficits.  The patient struggled getting the remote control to turn the sound off the TV and had significant difficulty managing the remote control to where he could depress the correct button.  Patient did have movement in hands  and displayed movements in his feet but these were limited without good fine motor control.  Patient had some dysarthria of speech.  Patient was aware of what has been going on and was oriented to being in the hospital and the events with his surgical interventions.  Patient described information in Dr. Gwendlyn Lemmings had provided him immediately before as well as after his neurosurgical interventions.  Patient describes pain and muscle deficits that are continued but notes he is gaining some increased control over his upper extremities each day.  Patient reports that this has been very stressful for him but he feels like he is managing his hospitalization and extended time on rehab well.          Electronically Signed   _______________________ Chapman Commodore, Psy.D. Clinical Neuropsychologist

## 2023-06-05 NOTE — Progress Notes (Signed)
 PROGRESS NOTE   Subjective/Complaints:  Per pt has temp of 102- per nursing had temp of 100.4 this AM- but down to 99.9 even after drinking coffee just now.  Slept poorly- had a nightmare.  Also having R hand swelling- doesn't know why -- thinks from fall that got him into hospital- using ice-   LBM yesterday ROS:    Pt denies SOB, abd pain, CP, N/V/C/D, and vision changes    Objective:   No results found.   Recent Labs    06/03/23 0556 06/04/23 0507  WBC 7.1 7.3  HGB 7.5* 8.7*  HCT 23.7* 27.8*  PLT 271 307   Recent Labs    06/02/23 0840 06/03/23 0556  NA 129* 131*  K 5.2* 5.1  CL 90* 92*  CO2 24 25  GLUCOSE 104* 102*  BUN 65* 48*  CREATININE 10.62* 8.71*  CALCIUM  7.6* 7.5*    Intake/Output Summary (Last 24 hours) at 06/05/2023 0827 Last data filed at 06/05/2023 0754 Gross per 24 hour  Intake 598 ml  Output 2000 ml  Net -1402 ml        Physical Exam: Vital Signs Blood pressure 109/66, pulse 98, temperature 99.9 F (37.7 C), temperature source Oral, resp. rate 19, height 6\' 1"  (1.854 m), weight 110 kg, SpO2 97%.     T- 99.9 after some coffee.  General: awake, alert, appropriate,  sitting up slightly in blow air loss mattress; NAD HENT: conjugate gaze; oropharynx moist CV: regular rate and rhythm- rate in 90's; no JVD Pulmonary: CTA B/L; no W/R/R- good air movement GI: soft, NT, ND, (+)BS Psychiatric: appropriate- brighter affect Neurological: Ox3- slightly dysarthric this AM- I think due to dry mouth? Just woke up MSK- R hand swelling esp ulnar side of hand/dorsum and L wrist-    Skin: C/D/I. Multiple lesions on BL legs with various stages of healing.  + RUE fistula - intact   MSK:      LLE knee with palpable effusion, no warmth, no ttp Mood: Pleasant affect, appropriate mood.  Neurologic exam: Alert and oriented x 4, fluent, follows commands, cranial nerves II through XII grossly  intact, no hypertonia noted  Sensation: Reduced to light touch in distal right lower extremity to the mid-thigh       Strength:                RUE: 2/5 SA, 2/5 EF, 2/5 EE, 2/5 WE, 2/5 FF, 2/5 FA                LUE:  3/5 SA, 3/5 EF, 3/5 EE, 3/5 WE, 3/5 FF, 3/5 FA                RLE: 2/5 HF, 2/5 KE, 2/5  DF, 2/5  EHL, 2/5  PF                 LLE:  3/5 HF, 4/5 KE, 4/5  DF, 4/5  EHL, 4/5  PF  Neuroexam stable 4/21  Assessment/Plan: 1. Functional deficits which require 3+ hours per day of interdisciplinary therapy in a comprehensive inpatient rehab setting. Physiatrist is providing close team supervision and 24 hour management of active  medical problems listed below. Physiatrist and rehab team continue to assess barriers to discharge/monitor patient progress toward functional and medical goals  Care Tool:  Bathing    Body parts bathed by patient: Right arm, Chest, Abdomen, Right upper leg, Left upper leg, Face   Body parts bathed by helper: Left arm, Front perineal area, Buttocks, Right lower leg, Left lower leg     Bathing assist Assist Level: Moderate Assistance - Patient 50 - 74%     Upper Body Dressing/Undressing Upper body dressing   What is the patient wearing?: Pull over shirt    Upper body assist Assist Level: Moderate Assistance - Patient 50 - 74%    Lower Body Dressing/Undressing Lower body dressing      What is the patient wearing?: Pants     Lower body assist Assist for lower body dressing: Maximal Assistance - Patient 25 - 49%     Toileting Toileting Toileting Activity did not occur (Clothing management and hygiene only): N/A (no void or bm) (anuric, HD patient)  Toileting assist Assist for toileting: Maximal Assistance - Patient 25 - 49%     Transfers Chair/bed transfer  Transfers assist     Chair/bed transfer assist level: 2 Helpers     Locomotion Ambulation   Ambulation assist   Ambulation activity did not occur: Safety/medical concerns           Walk 10 feet activity   Assist  Walk 10 feet activity did not occur: Safety/medical concerns        Walk 50 feet activity   Assist Walk 50 feet with 2 turns activity did not occur: Safety/medical concerns         Walk 150 feet activity   Assist Walk 150 feet activity did not occur: Safety/medical concerns         Walk 10 feet on uneven surface  activity   Assist Walk 10 feet on uneven surfaces activity did not occur: Safety/medical concerns         Wheelchair     Assist Is the patient using a wheelchair?: Yes Type of Wheelchair: Manual    Wheelchair assist level: Dependent - Patient 0% Max wheelchair distance: 150 ft    Wheelchair 50 feet with 2 turns activity    Assist        Assist Level: Dependent - Patient 0%   Wheelchair 150 feet activity     Assist      Assist Level: Dependent - Patient 0%   Blood pressure 109/66, pulse 98, temperature 99.9 F (37.7 C), temperature source Oral, resp. rate 19, height 6\' 1"  (1.854 m), weight 110 kg, SpO2 97%.  Medical Problem List and Plan: 1. 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  Antithrombotics: -DVT/anticoagulation:  Mechanical: Sequential compression devices, below  knee Bilateral lower extremities             -antiplatelet therapy: N/A--no antiplatelet due to hx of recurrent GIB 3. Pain Management:  oxycodone  prn. Gabapentin  300 mg/hs.              --has Flexeril  10 mg TID prn               - patient with intermittent, severe b/l headache - add topomax 25 mg BID    4/19- pt rating pain 07/19/08 which is "tolerable"  4/22- pain slightly better this AM 4. Mood/Behavior/Sleep: LCSW to follow for evaluation and support             -antipsychotic agents: N/A             --melatonin prn for insomnia.  5. Neuropsych/cognition: This patient is capable of making decisions on his own behalf. 6. Skin/Wound Care: Routine pressure relief measures.   4/19- due to ulcers on legs, on low air loss mattress 7. Fluids/Electrolytes/Nutrition: Strict I/O. Daily weights. Labs with HD. 8. ESRD- HD MWF: Schedule HD at the end of the day to help with tolerance of therapy             --Sensipar  and Renvela  for metabolic bone disease.   - Patient reports no longer makes urine  4/20- pt getting HD this AM- last HD was Wednesday.   4/21 dialysis moved to tomorrow  4/22- HD to be done today 9. Anemia of chronic disease: Monitor H/H  -4/21 HGB lower 7.5, FOBT, recheck tomorrow  4/22- HB 8.7 on today's check-con't to monitor 10. H/o recurrent GIB/J[employer]ovah[employer]: Continue Protonix  BID 11. H/o Hypotension: Has had drop  in BP with HD. Now cervical SCI.  --Monitor for symptoms day after HD and with activity.  -4/18 baseline blood pressure stable, monitor with activity 4/19- BP actually slightly elevated today- 140s-150's- con't regimen, since can drop with HD and therapy 4/20- BP actually doing better today- in 100s'110s- however HR elevated, likely to fluid overload 4/21 nephrology started midodrine  before dialysis, BP appears to be doing a little better today 4/22- less hypotensive this AM laying- will monitor for [facility] 4/23- Tachycardia slightly better- in high 90's- having some intermittent elevated BP's- will monitor    06/05/2023    5:43 AM 06/05/2023    5:00 AM 06/04/2023    6:58 PM  Vitals with BMI  Weight  242 lbs 8 oz   BMI  32   Systolic 109  143  Diastolic 66  79  Pulse 98  96    12.  Constipation: Small BM on 04/14 --had of nulytey yesterday and another today?  -Xray 4/17 without excessive stool, patient reports stool still little hard yesterday.  Will increase MiraLAX  to 3 times a day, continue Senokot twice a day. Suppository PRN   4/19- had stool last night- large soft stool  4/21 LBM last night  4/23- LBM yesterday 13. ?left knee effusion. Not currently bothersome. Consider imaging.  14.  SIRS- Fever/hypotension/tachycardia  - 4/21 Currently afebrile, he got to 50 cc IVF yesterday, CXR negative, EKG sinus tachycardia. Patient was started on Maxipime  and vancomycin  yesterday.  Appears to be doing much better today.  WBC not elevated at 7.1.  Continue to follow blood cultures  4/22- Blood Cx's (-) so far- WBC 7.3- will con't IV ABX until know final results of blood Cx's- CXR (-)- but temp was 103.2 yesterday overnight.   4/23- Blood C'x (-)  x2 days so far- T 99.9 this Am after a few sips of coffee- was 100.4 this AM- will con't IV ABX since concerned about possible bacteremia- esp since having low grade temps still 15. Tachycardia  4/22- doing better- right at 100 this AM- will monitor- likely can be from dregs of infection vs OH. -will monitor  4/23- Tachycardia improving-  16. R hand swelling  4/23- doing ice- will do xrays    I spent a total of 35   minutes on total care today- >50% coordination of care- due to  D/w Nursing x2- also d/w pt about his Sx's- Also ordered xrays     LOS: 6 days A FACE TO FACE EVALUATION WAS PERFORMED  Raechell Singleton 06/05/2023, 8:27 AM

## 2023-06-05 NOTE — Progress Notes (Signed)
 Recreational Therapy Session Note  Patient Details  Name: Carl Neels Sr. MRN: [id number] Date of Birth: [birth year] Today's Date: 06/05/2023  Pain:no c/o  Pt participated in animal assisted activity seated w/c level with supervision.  Pt easily engaged with pet partner team sharing about various dogs he's had over the years.  Pt appreciative of this visit.  [name] 06/05/2023, 3:37 PM

## 2023-06-06 ENCOUNTER — Other Ambulatory Visit (HOSPITAL_COMMUNITY): Payer: Self-pay

## 2023-06-06 DIAGNOSIS — S14109S Unspecified injury at unspecified level of cervical spinal cord, sequela: Secondary | ICD-10-CM | POA: Diagnosis not present

## 2023-06-06 MED ORDER — VANCOMYCIN HCL IN DEXTROSE 1-5 GM/200ML-% IV SOLN
1000.0000 mg | INTRAVENOUS | Status: DC
  Filled 2023-06-06: qty 200

## 2023-06-06 NOTE — Progress Notes (Signed)
 Patient ID: Alastor Kneale Sr., male   DOB: [birth year], 63 y.o.   MRN: [id number]  Patient/Family Conference  Patient/family in attendance: Pt, Amelia-pt's daughter, Marcel-pt's son, Ileene Mallick, tow sister's and one brother of pt  Staff in attendance: Dr. Raynaldo Call, Angelina-Pt care coordinator, Becky-Sw, Sarah-OT and Allison-PT  Main focus:Pt's level of care, medical concerns and discharge plan  Synopsis of information shared:MD shared medical issues and spinal cord injury explanation. Each therapists discussed the progress and goals working on with pt. SW discuss options available to pt and the care he will require at discharge.   Barriers/concerns expressed by patient and family: The flight of stairs pt lives up to the second floor apartment and the amount of care pt will require at discharge from rehab.   Patient/family response: Listened and asked appropriate questions. Will look at options and see what will work the best for all and pt.   Follow-up/action plans: Work on getting rid of the low air loss bed so pt can work on transfers and bed mobility. Family to discuss and look at what each can do to assist pt. Aware of his need for 24/7 care. Lists given to look at [facility] and private duty. Family members to come in and see pt in therapies. Continue to work on best option for pt. Son to look into first floor apartment to move too.

## 2023-06-06 NOTE — Progress Notes (Signed)
 Physical Therapy Session Note  Patient Details  Name: Carl Tallon Sr. MRN: [id number] Date of Birth: 1960/04/01  Today's Date: 06/06/2023 PT Individual Time: 1117-1156 PT Individual Time Calculation (min): 39 min   Short Term Goals: Week 1:  PT Short Term Goal 1 (Week 1): pt will perform STS with RW with assist of 1 PT Short Term Goal 2 (Week 1): Pt will perform stand pivot with LRAD with assist of 1 PT Short Term Goal 3 (Week 1): pt will initiate gait training  Skilled Therapeutic Interventions/Progress Updates: Patient sitting in TIS with family present on entrance to room. Patient alert and agreeable to PT session.   Patient with no complaints of pain during session.   Therapeutic Activity: - Pt required maxA to scoot anteriorly with multimodal cues for technique to advance hips one at a time. MaxA + 2 to scoot posteriorly.   Therapeutic Exercise: Pt performed the following exercises with therapist providing the described cuing and facilitation for improvement. Pt performed in order to strengthen B LE musculature, and hip/back extensors.  - Pt in standing frame for 20 min. Pt cued throughout to remain in upright posture with B hands on 2 pillows (rest breaks with pt flexing slightly forward onto forearms). Pt given slack on harness with cues to activate glutes to upright standing with maxA at first, then progressed to light modA with tactile cues on glutes ( 3 rounds performed throughout total of 20 min up to 8 reps). Pt also performed upright stance without B UE support (cued to cross around chest) with pt doing so for 1 min 30s, then rest break, then for another minute with truncal sway, but pt able to  maintain.   Patient sitting in TIS at end of session with brakes locked, and all needs within reach.      Therapy Documentation Precautions:  Precautions Precautions: Fall, Cervical, Other (comment) (HD MWF (RUE restricted).) Recall of Precautions/Restrictions:  Impaired Precaution/Restrictions Comments: Verbally reviewed cervical precautions. Required Braces or Orthoses: Cervical Brace Cervical Brace: Hard collar Restrictions Weight Bearing Restrictions Per Provider Order: No   Therapy/Group: Individual Therapy  Josep Luviano PTA 06/06/2023, 12:06 PM

## 2023-06-06 NOTE — Progress Notes (Signed)
 Family conference done. Patient requested for a regular bed. Educated patient on wearing prafo boots at night, right hand splint at night and adhering to q 2 hour turns. Family agreeable to the plan.

## 2023-06-06 NOTE — Progress Notes (Signed)
 Physical Therapy Session Note  Patient Details  Name: Carl Tiggs Sr. MRN: [id number] Date of Birth: Jun 17, 1960  Today's Date: 06/06/2023 PT Individual Time: 1005-1015 PT Individual Time Calculation (min): 10 min   Short Term Goals: Week 1:  PT Short Term Goal 1 (Week 1): pt will perform STS with RW with assist of 1 PT Short Term Goal 2 (Week 1): Pt will perform stand pivot with LRAD with assist of 1 PT Short Term Goal 3 (Week 1): pt will initiate gait training  Skilled Therapeutic Interventions/Progress Updates:    Pt and family participated in family conference with MD, therapists, Care Coordinator and CSW.   PT provided education to pt and family in regards to overall current status in regards to mobility, generalized goals, BP management, equipment needs, and barriers at this time. Reviewed focus of PT and current transfer techniques and requirement at this time for +2 assist with goals set at min assist which still requires hands on assist at d/c 24/7.   Therapy Documentation Precautions:  Precautions Precautions: Fall, Cervical, Other (comment) (HD MWF (RUE restricted).) Recall of Precautions/Restrictions: Impaired Precaution/Restrictions Comments: Verbally reviewed cervical precautions. Required Braces or Orthoses: Cervical Brace Cervical Brace: Hard collar Restrictions Weight Bearing Restrictions Per Provider Order: No    Therapy/Group: Individual Therapy  Gita Lamb Amadeo June, PT, DPT, CBIS  06/06/2023, 11:39 AM

## 2023-06-06 NOTE — Progress Notes (Signed)
 Occupational Therapy Weekly Progress Note  Patient Details  Name: Carl Kollman Sr. MRN: [id number] Date of Birth: [birth year]  Beginning of progress report period: May 30, 2023 End of progress report period: June 06, 2023   Patient has met 0 of 3 short term goals.  Pt with limited progress in first week d/t medical needs, limited mobility in BUE/BLE. Pt with increased use of LUE for manipulating drink cups and utensils. Pt continues to bed Max-total A with all ADLs and +2 for transfers/mobility. Family meeting earlier today to discuss level of care at current time and suspected for D/C.  Patient continues to demonstrate the following deficits: muscle weakness, muscle joint tightness, and muscle paralysis, decreased cardiorespiratoy endurance, abnormal tone and decreased coordination, and decreased sitting balance, decreased standing balance, decreased postural control, and decreased balance strategies and therefore will continue to benefit from skilled OT intervention to enhance overall performance with BADL and Reduce care partner burden.  Patient progressing toward long term goals..  Continue plan of care.  OT Short Term Goals Week 1:  OT Short Term Goal 1 (Week 1): Pt will perform toilet transfer with consistent Mod A (x1) + LRAD. OT Short Term Goal 1 - Progress (Week 1): Not met OT Short Term Goal 2 (Week 1): Pt will thread BLE into LB garments with Min A + LRAD. OT Short Term Goal 2 - Progress (Week 1): Not met OT Short Term Goal 3 (Week 1): Pt will bathe UB with Min A + LRAD. OT Short Term Goal 3 - Progress (Week 1): Not met Week 2:  OT Short Term Goal 1 (Week 2): Pt will complete bed mobility at Mod A in preparation for LB dressing at bed level OT Short Term Goal 2 (Week 2): Pt will complete self feeding at Min A with LUE with AE as necessary OT Short Term Goal 3 (Week 2): Pt will complete UB dressing at Mod A   Therapy Documentation Precautions:  Precautions Precautions:  Fall, Cervical, Other (comment) (HD MWF (RUE restricted).) Recall of Precautions/Restrictions: Impaired Precaution/Restrictions Comments: Verbally reviewed cervical precautions. Required Braces or Orthoses: Cervical Brace Cervical Brace: Hard collar Restrictions Weight Bearing Restrictions Per Provider Order: No  [name], OTD, OTR/L 06/06/2023, 11:58 AM

## 2023-06-06 NOTE — Progress Notes (Signed)
 Physical Therapy Session Note  Patient Details  Name: Carl Pautz Sr. MRN: [id number] Date of Birth: [birth year]  Today's Date: 06/06/2023 PT Individual Time: 0830-0912 PT Individual Time Calculation (min): 42 min   Short Term Goals: Week 1:  PT Short Term Goal 1 (Week 1): pt will perform STS with RW with assist of 1 PT Short Term Goal 2 (Week 1): Pt will perform stand pivot with LRAD with assist of 1 PT Short Term Goal 3 (Week 1): pt will initiate gait training  Skilled Therapeutic Interventions/Progress Updates:   Pt presents in bed finishing breakfast with NT. Donned Tedhose and ACE wraps for BP management dependently while discussing family conference planned later this morning in regards to goals, planning for d/c, etc. Pt in agreement that stairs are huge barrier at this time and likely will not be able to transition home immediately especially due to HD schedule.   NMR to address bed mobility, balance, transfers and postural control retraining. Due to air mattress, increased difficulty with moving in the bed and required min to mod assist for trunk management due to LOB posteriorly with air mattress. Once positioned able to sit with supervision with very flexed posture and posterior pelvic tilt. Utilized Stedy with max A for sit > stand from elevated surface with cues for trunk and hip extension. Difficulty achieving full upright posture in standing. Transferred to TIS w/c and transported to gym dependently. Focused on NMR for slideboard transfers with emphasis on head hips relationship, activation through BLE, postural control, and technique with overall min to mod assist with +2 for safety and initial assist with positioning of slideboard. Pt with minimal ability to activate through BLE using a lot of momentum vs pushing up through LE's. Pt's RUE is a limiting factor in ability to functionally use during transfer. Performed lateral scooting R and L on edge of mat to focus on technique and  head hips relationship. Transferred back to w/c and left in care of tech awaiting next OT.  Therapy Documentation Precautions:  Precautions Precautions: Fall, Cervical, Other (comment) (HD MWF (RUE restricted).) Recall of Precautions/Restrictions: Impaired Precaution/Restrictions Comments: Verbally reviewed cervical precautions. Required Braces or Orthoses: Cervical Brace Cervical Brace: Hard collar Restrictions Weight Bearing Restrictions Per Provider Order: No    Pain:  No pain reported.     Therapy/Group: Individual Therapy  Gita Lamb Amadeo June, PT, DPT, CBIS  06/06/2023, 11:25 AM

## 2023-06-06 NOTE — Progress Notes (Signed)
 PROGRESS NOTE   Subjective/Complaints:  Pt has no fever- not even low grade.  Said slides down in bed and cannot fit in bed- needs a new bed We have family conference today- went over with pt what we will go over-  Couldn't go over results- they weren't available when I saw pt, however did look over xray independently ROS:      Pt denies SOB, abd pain, CP, N/V/C/D, and vision changes Still having R hand swelling  Objective:   DG Hand 2 View Right Result Date: 06/05/2023 CLINICAL DATA:  Localized swelling of right hand. History of end-stage renal disease. EXAM: RIGHT HAND - 2 VIEW; RIGHT WRIST - 2 VIEW COMPARISON:  None Available. FINDINGS: Right wrist: 1 mm ulnar negative variance. Minimal triscaphe and thumb carpometacarpal joint space narrowing, subchondral cystic change, and peripheral osteophytosis. Surgical clips overlie the volar lateral/radial aspect of the wrist at the level of the distal radius. There are high-grade calcifications that appear to represent vascular calcifications associated with likely the radial artery, with possible vessel enlargement associated with volar lateral bulging of the overlying skin at the level of the distal radial metaphysis. Question a dialysis shunt in this region. Right hand: Mild thumb interphalangeal and second through fifth DIP joint space narrowing and peripheral osteophytosis. Moderate atherosclerotic calcifications. No acute fracture is seen. No dislocation. No cortical erosion. IMPRESSION: 1. 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 2. Minimal triscaphe and thumb carpometacarpal osteoarthritis. 3. High-grade calcifications that appear to represent vascular calcifications associated with likely the radial artery, with possible vessel enlargement associated with volar lateral bulging of the overlying skin at the level of the distal radial metaphysis. Question a  dialysis shunt in this region. The patient does have a history of end-stage renal disease reported on indications for prior radiology studies on PACS. Electronically Signed   By: Bertina Broccoli M.D.   On: 06/05/2023 16:22   DG Wrist 2 Views Right Result Date: 06/05/2023 CLINICAL DATA:  Localized swelling of right hand. History of end-stage renal disease. EXAM: RIGHT HAND - 2 VIEW; RIGHT WRIST - 2 VIEW COMPARISON:  None Available. FINDINGS: Right wrist: 1 mm ulnar negative variance. Minimal triscaphe and thumb carpometacarpal joint space narrowing, subchondral cystic change, and peripheral osteophytosis. Surgical clips overlie the volar lateral/radial aspect of the wrist at the level of the distal radius. There are high-grade calcifications that appear to represent vascular calcifications associated with likely the radial artery, with possible vessel enlargement associated with volar lateral bulging of the overlying skin at the level of the distal radial metaphysis. Question a dialysis shunt in this region. Right hand: Mild thumb interphalangeal and second through fifth DIP joint space narrowing and peripheral osteophytosis. Moderate atherosclerotic calcifications. No acute fracture is seen. No dislocation. No cortical erosion. IMPRESSION: 1. 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 2. Minimal triscaphe and thumb carpometacarpal osteoarthritis. 3. High-grade calcifications that appear to represent vascular calcifications associated with likely the radial artery, with possible vessel enlargement associated with volar lateral bulging of the overlying skin at the level of the distal radial metaphysis. Question a dialysis shunt in this region. The patient does have a  history of end-stage renal disease reported on indications for prior radiology studies on PACS. Electronically Signed   By: Bertina Broccoli M.D.   On: 06/05/2023 16:22     Recent Labs    06/04/23 0507  WBC 7.3  HGB 8.7*   HCT 27.8*  PLT 307   No results for input(s): "NA", "K", "CL", "CO2", "GLUCOSE", "BUN", "CREATININE", "CALCIUM " in the last 72 hours.   Intake/Output Summary (Last 24 hours) at 06/06/2023 0925 Last data filed at 06/06/2023 0853 Gross per 24 hour  Intake 713 ml  Output --  Net 713 ml        Physical Exam: Vital Signs Blood pressure (!) 146/83, pulse 91, temperature 98.3 F (36.8 C), resp. rate 18, height 6\' 1"  (1.854 m), weight 110 kg, SpO2 100%.       General: awake, alert, appropriate, sitting up in bed- doesn't fit well- due to his height;  NAD HENT: conjugate gaze; oropharynx moist CV: regular rate and rhythm; no JVD Pulmonary: CTA B/L; no W/R/R- good air movement GI: soft, NT, ND, (+)BS Psychiatric: appropriate- focused on getting better, but not acknowledging deficits Neurological: Ox3  MSK- R hand swelling esp ulnar side of hand/dorsum and L wrist-stable this AM RUE- Biceps 4/5; WE 4-/5; Triceps 2/5; Grip 2-/5 and FA 1/5 LUE- 4/5 except triceps 3+ to 4-/5 RLE- HF 1 to 2-/5; KE 2-/5; DF/PF 2-/5 LLE- HF 3/5; KE 4-/5; DF/PF 4/5    Skin: C/D/I. Multiple lesions on BL legs with various stages of healing.  + RUE fistula - intact   MSK:      LLE knee with palpable effusion, no warmth, no ttp Mood: Pleasant affect, appropriate mood.  Neurologic exam: Alert and oriented x 4, fluent, follows commands, cranial nerves II through XII grossly intact, no hypertonia noted  Sensation: Reduced to light touch in distal right lower extremity to the mid-thigh       Strength:                RUE: 2/5 SA, 2/5 EF, 2/5 EE, 2/5 WE, 2/5 FF, 2/5 FA                LUE:  3/5 SA, 3/5 EF, 3/5 EE, 3/5 WE, 3/5 FF, 3/5 FA                RLE: 2/5 HF, 2/5 KE, 2/5  DF, 2/5  EHL, 2/5  PF                 LLE:  3/5 HF, 4/5 KE, 4/5  DF, 4/5  EHL, 4/5  PF  Neuroexam stable 4/21  Assessment/Plan: 1. Functional deficits which require 3+ hours per day of interdisciplinary therapy in a comprehensive  inpatient rehab setting. Physiatrist is providing close team supervision and 24 hour management of active medical problems listed below. Physiatrist and rehab team continue to assess barriers to discharge/monitor patient progress toward functional and medical goals  Care Tool:  Bathing    Body parts bathed by patient: Right arm, Chest, Abdomen, Right upper leg, Left upper leg, Face   Body parts bathed by helper: Left arm, Front perineal area, Buttocks, Right lower leg, Left lower leg     Bathing assist Assist Level: Moderate Assistance - Patient 50 - 74%     Upper Body Dressing/Undressing Upper body dressing   What is the patient wearing?: Pull over shirt    Upper body assist Assist Level: Moderate Assistance - Patient 50 - 74%  Lower Body Dressing/Undressing Lower body dressing      What is the patient wearing?: Pants     Lower body assist Assist for lower body dressing: Maximal Assistance - Patient 25 - 49%     Toileting Toileting Toileting Activity did not occur (Clothing management and hygiene only): N/A (no void or bm) (anuric, HD patient)  Toileting assist Assist for toileting: Maximal Assistance - Patient 25 - 49%     Transfers Chair/bed transfer  Transfers assist     Chair/bed transfer assist level: 2 Helpers     Locomotion Ambulation   Ambulation assist   Ambulation activity did not occur: Safety/medical concerns          Walk 10 feet activity   Assist  Walk 10 feet activity did not occur: Safety/medical concerns        Walk 50 feet activity   Assist Walk 50 feet with 2 turns activity did not occur: Safety/medical concerns         Walk 150 feet activity   Assist Walk 150 feet activity did not occur: Safety/medical concerns         Walk 10 feet on uneven surface  activity   Assist Walk 10 feet on uneven surfaces activity did not occur: Safety/medical concerns         Wheelchair     Assist Is the patient  using a wheelchair?: Yes Type of Wheelchair: Manual    Wheelchair assist level: Dependent - Patient 0% Max wheelchair distance: 150 ft    Wheelchair 50 feet with 2 turns activity    Assist        Assist Level: Dependent - Patient 0%   Wheelchair 150 feet activity     Assist      Assist Level: Dependent - Patient 0%   Blood pressure (!) 146/83, pulse 91, temperature 98.3 F (36.8 C), resp. rate 18, height 6\' 1"  (1.854 m), weight 110 kg, SpO2 100%.  Medical Problem List and Plan: 1. 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  Antithrombotics: -DVT/anticoagulation:  Mechanical: Sequential compression devices, below knee Bilateral lower extremities             -antiplatelet therapy: N/A--no antiplatelet due to hx of recurrent GIB 3. Pain Management:  oxycodone  prn. Gabapentin  300 mg/hs.              --has Flexeril  10 mg TID prn              - patient with intermittent, severe b/l headache - add topomax 25 mg BID    4/19- pt rating pain 07/19/08 which is "tolerable"  4/22- pain slightly better this AM 4. Mood/Behavior/Sleep: LCSW to follow for evaluation and support             -antipsychotic agents: N/A             --  melatonin prn for insomnia.  5. Neuropsych/cognition: This patient is capable of making decisions on his own behalf. 6. Skin/Wound Care: Routine pressure relief measures.   4/19- due to ulcers on legs, on low air loss mattress 7. Fluids/Electrolytes/Nutrition: Strict I/O. Daily  weights. Labs with HD. 8. ESRD- HD MWF: Schedule HD at the end of the day to help with tolerance of therapy             --Sensipar  and Renvela  for metabolic bone disease.   - Patient reports no longer makes urine  4/20- pt getting HD this AM- last HD was Wednesday.   4/21 dialysis moved to tomorrow  4/22- HD to be done today 9. Anemia of chronic disease: Monitor H/H  -4/21 HGB lower 7.5, FOBT, recheck tomorrow  4/22- HB 8.7 on today's check-con't to monitor 10. H/o recurrent GIB/Jehovah's Witness: Continue Protonix  BID 11. H/o Hypotension: Has had drop  in BP with HD. Now cervical SCI.  --Monitor for symptoms day after HD and with activity.  -4/18 baseline blood pressure stable, monitor with activity 4/19- BP actually slightly elevated today- 140s-150's- con't regimen, since can drop with HD and therapy 4/20- BP actually doing better today- in 100s'110s- however HR elevated, likely to fluid overload 4/21 nephrology started midodrine  before dialysis, BP appears to be doing a little better today 4/22- less hypotensive this AM laying- will monitor for [facility] 4/23- Tachycardia slightly better- in high 90's- having some intermittent elevated BP's- will monitor 4/24- BP running 140-s150s, but don't want to drop it since having HD    06/06/2023    6:11 AM 06/05/2023   10:54 PM 06/05/2023    8:43 PM  Vitals with BMI  Systolic 146 151 409  Diastolic 83 93 93  Pulse 91 96 92    12. Constipation: Small BM on 04/14 --had of nulytey yesterday and another today?  -Xray 4/17 without excessive stool, patient reports stool still little hard yesterday.  Will increase MiraLAX  to 3 times a day, continue Senokot twice a day. Suppository PRN   4/19- had stool last night- large soft stool  4/21 LBM last night  4/23- LBM yesterday 13. ?left knee effusion. Not currently bothersome. Consider imaging.  14.  SIRS- Fever/hypotension/tachycardia  - 4/21 Currently afebrile, he got to 50 cc IVF yesterday, CXR  negative, EKG sinus tachycardia. Patient was started on Maxipime  and vancomycin  yesterday.  Appears to be doing much better today.  WBC not elevated at 7.1.  Continue to follow blood cultures  4/22- Blood Cx's (-) so far- WBC 7.3- will con't IV ABX until know final results of blood Cx's- CXR (-)- but temp was 103.2 yesterday overnight.   4/23- Blood C'x (-) x2 days so far- T 99.9 this Am after a few sips of coffee- was 100.4 this AM- will con't IV ABX since concerned about possible bacteremia- esp since having low grade temps still  4/24- No more fevers, even low grade- no growth on blood Cx' bu it says that didn't get enough blood, so much be falsely negative 15. Tachycardia  4/22- doing better- right at 100 this AM- will monitor- likely can be from dregs of infection vs OH. -will monitor  4/23- Tachycardia improving- 4/24- HR in 90's right now- doing better  16. R hand swelling  4/23- doing ice- will do xrays  4/24- Xray doesn't look like cause of swelling, however could be due to new shunt? 17. Hypocalcemia  4/24- his Albumin  is 2.1, so probably ok,  and phos coming down- will leave to renal- thank you for their help   I spent a total of 58   minutes on total care today- >50% coordination of care- due to  D/w pt -went back and went over Xray results- also d/w nursing to get new bed- also went over xray with PA; and family conference which was 35 minutes going over SCI, mild neurogenic bowel; risk of Spasticity and increased risk of DVT esp since cannot be on blood thinners due ot recurrent GI bleed.     LOS: 7 days A FACE TO FACE EVALUATION WAS PERFORMED  Becky Colan 06/06/2023, 9:25 AM

## 2023-06-06 NOTE — Progress Notes (Signed)
 Physical Therapy Session Note  Patient Details  Name: [name]. MRN: [id number] Date of Birth: Jun 17, 1960  Today's Date: 06/06/2023 PT Individual Time: 4098-1191 PT Individual Time Calculation (min): 57 min   Short Term Goals: Week 1:  PT Short Term Goal 1 (Week 1): pt will perform STS with RW with assist of 1 PT Short Term Goal 2 (Week 1): Pt will perform stand pivot with LRAD with assist of 1 PT Short Term Goal 3 (Week 1): pt will initiate gait training  Skilled Therapeutic Interventions/Progress Updates:   Received pt sitting in TIS WC, pt agreeable to PT treatment, and did not state pain level during session. Session with emphasis on functional mobility/transfers, generalized strengthening and endurance, dynamic standing balance/coordination, and power WC education. Pt slightly verbose, telling therapist about events leading up to hospital admission - provided support, therapeutic listening, and gentle redirection.  Provided pt with 20x20 power chair with Roho cushion to trial for improved independence with WC mobility - pt performed knee extensions x15 on LLE and x9 on RLE while therapist retrieved chair. Pt initially hesitant to trying power chair, thinking it's making him "worse" and he is regressing - educated pt on benefits of using power WC to allow for independence with functional mobility. Pt stood in [place] dependently with +2 assist and transferred to power WC. Educated pt on power WC features including tilt and recline for pressure relief and speed adjustment - emphasized need to wear seatbelt at all times for safety. Pt performed WC mobility ~7ft with supervision (would benefit from switching joystick to L side of chair due to edema and difficulty using joystick with RUE). Practiced navigating tight turns and switching between appropriate speeds but pt would benefit from continued practice. Returned to room and concluded session with pt sitting in power WC, needs within  reach, and seatbelt on. RN at bedside and updated safety plan to [facility].   Therapy Documentation Precautions:  Precautions Precautions: Fall, Cervical, Other (comment) (HD MWF (RUE restricted).) Recall of Precautions/Restrictions: Impaired Precaution/Restrictions Comments: Verbally reviewed cervical precautions. Required Braces or Orthoses: Cervical Brace Cervical Brace: Hard collar Restrictions Weight Bearing Restrictions Per Provider Order: No  Therapy/Group: Individual Therapy Nicolas Barren Zaunegger Nena Bank PT, DPT 06/06/2023, 7:13 AM

## 2023-06-06 NOTE — Progress Notes (Signed)
 Patient called this RN to room to report feeling dizzy and seeing the room "upside down" for a few seconds after reaching down for his phone. Vitals checked and WDL;pt reports this same feeling about a week ago while in the hospital but unsure of what precipitated it. Patient denies further symptoms, pt set up for lunch and is eating.

## 2023-06-06 NOTE — Progress Notes (Signed)
 Occupational Therapy Session Note  Patient Details  Name: Carl Stirewalt Sr. MRN: [id number] Date of Birth: January 16, 1961  Today's Date: 06/06/2023 OT Individual Time: 0930-1000 & 1015-1030 OT Individual Time Calculation (min): 30 min & 15 min    Short Term Goals: Week 1:  OT Short Term Goal 1 (Week 1): Pt will perform toilet transfer with consistent Mod A (x1) + LRAD. OT Short Term Goal 2 (Week 1): Pt will thread BLE into LB garments with Min A + LRAD. OT Short Term Goal 3 (Week 1): Pt will bathe UB with Min A + LRAD.  Skilled Therapeutic Interventions/Progress Updates:      Therapy Documentation Precautions:  Precautions Precautions: Fall, Cervical, Other (comment) (HD MWF (RUE restricted).) Recall of Precautions/Restrictions: Impaired Precaution/Restrictions Comments: Verbally reviewed cervical precautions. Required Braces or Orthoses: Cervical Brace Cervical Brace: Hard collar Restrictions Weight Bearing Restrictions Per Provider Order: No Session 1 General: "Hi there" Pt seated in W/C upon OT arrival, agreeable to OT. Direct handoff from PT  Pain: no pain reported  Balance: OT providing skilled intervention on transfer training, endurance and standing tolerance. Pt completing 3x sit to stands within // bars with Max A +2 to rise into standing. Pt able to stand for ~1:00 per standing trial with intermittent unilateral UE support versus bilateral UE support. Pt with no symptoms of OH during standing trials.   Pt seated in W/C at end of session , OT wheeling back to room for family conference.    Session 2 (family conference)  Pain: no pain reported  Other Treatments: Patient, pt care partner, Dr Raynaldo Call, Clide Dalton CSW, Shade Darby RN coordinator, Richad Champagne PT and Abraham Hoffmann OT present at family meeting. Pt and care partner educated on functional level of assistance for ADLs and functional mobility/transfers. Pt currently requiring total A for ADLs d/t limited mobility and UE strength/ROM.  Pt and family educated specifically on CARE tool levels of assistance provided at current status and level of assistance for OT goals once at D/C. It is recommended care partner to complete family training in order to provide increase understanding ov level of assistance necessary once D/C.    Pt seated in W/C at end of session with W/C alarm donned, call light within reach and 4Ps assessed.    Therapy/Group: Individual Therapy  [name], OTD, OTR/L 06/06/2023, 11:54 AM

## 2023-06-06 NOTE — Progress Notes (Signed)
 [facility] Progress Note   Subjective:   Seen in room - having ?vertigo which started with PT this morning, "I feel upside down." PT in room - d/w with patient now. Denies CP/dyspnea. Next HD planned for Friday (tomorrow)  Objective Vitals:   06/05/23 1256 06/05/23 2043 06/05/23 2254 06/06/23 0611  BP: 138/80 (!) 151/93 (!) 151/93 (!) 146/83  Pulse: 95 92 96 91  Resp: 18 18 20 18   Temp: 98.4 F (36.9 C) 97.7 F (36.5 C)  98.3 F (36.8 C)  TempSrc: Oral     SpO2: 100% 100% 100% 100%  Weight:      Height:       Physical Exam General: Well appearing, NAD. Neck brace in place. RA Heart: RRR Lungs: CTA anteriorly Abdomen: soft Extremities: no LE edema Dialysis Access: AVF +t/b  Additional Objective Labs: Basic Metabolic Panel: Recent Labs  Lab 06/02/23 0840 06/03/23 0556  NA 129* 131*  K 5.2* 5.1  CL 90* 92*  CO2 24 25  GLUCOSE 104* 102*  BUN 65* 48*  CREATININE 10.62* 8.71*  CALCIUM  7.6* 7.5*  PHOS 5.8* 4.3   Liver Function Tests: Recent Labs  Lab 06/02/23 0840 06/03/23 0556  ALBUMIN  2.4* 2.1*   No results for input(s): "LIPASE", "AMYLASE" in the last 168 hours. CBC: Recent Labs  Lab 06/02/23 0840 06/03/23 0556 06/04/23 0507  WBC 7.0 7.1 7.3  NEUTROABS  --  4.4  --   HGB 8.8* 7.5* 8.7*  HCT 27.1* 23.7* 27.8*  MCV 90.0 90.1 90.0  PLT 308 271 307   Blood Culture    Component Value Date/Time   SDES BLOOD LEFT HAND 06/02/2023 1743   SDES BLOOD LEFT HAND 06/02/2023 1743   SPECREQUEST  06/02/2023 1743    BOTTLES DRAWN AEROBIC AND ANAEROBIC Blood Culture results may not be optimal due to an inadequate volume of blood received in culture bottles   SPECREQUEST  06/02/2023 1743    AEROBIC BOTTLE ONLY Blood Culture results may not be optimal due to an inadequate volume of blood received in culture bottles   CULT  06/02/2023 1743    NO GROWTH 4 DAYS Performed at [facility], [address], [place], [place] [address]    CULT   06/02/2023 1743    NO GROWTH 4 DAYS Performed at [facility], [address] [place], [place] [address]    REPTSTATUS PENDING 06/02/2023 1743   REPTSTATUS PENDING 06/02/2023 1743    Cardiac Enzymes: No results for input(s): "CKTOTAL", "CKMB", "CKMBINDEX", "TROPONINI" in the last 168 hours. CBG: No results for input(s): "GLUCAP" in the last 168 hours. Iron Studies: No results for input(s): "IRON", "TIBC", "TRANSFERRIN", "FERRITIN" in the last 72 hours. @lablastinr3 @ Studies/Results: DG Hand 2 View Right Result Date: 06/05/2023 CLINICAL DATA:  Localized swelling of right hand. History of end-stage renal disease. EXAM: RIGHT HAND - 2 VIEW; RIGHT WRIST - 2 VIEW COMPARISON:  None Available. FINDINGS: Right wrist: 1 mm ulnar negative variance. Minimal triscaphe and thumb carpometacarpal joint space narrowing, subchondral cystic change, and peripheral osteophytosis. Surgical clips overlie the volar lateral/radial aspect of the wrist at the level of the distal radius. There are high-grade calcifications that appear to represent vascular calcifications associated with likely the radial artery, with possible vessel enlargement associated with volar lateral bulging of the overlying skin at the level of the distal radial metaphysis. Question a dialysis shunt in this region. Right hand: Mild thumb interphalangeal and second through fifth DIP joint  space narrowing and peripheral osteophytosis. Moderate atherosclerotic calcifications. No acute fracture is seen. No dislocation. No cortical erosion. IMPRESSION: 1. 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 2. Minimal triscaphe and thumb carpometacarpal osteoarthritis. 3. High-grade calcifications that appear to represent vascular calcifications associated with likely the radial artery, with possible vessel enlargement associated with volar lateral bulging of the overlying skin at the level of the distal radial metaphysis. Question  a dialysis shunt in this region. The patient does have a history of end-stage renal disease reported on indications for prior radiology studies on PACS. Electronically Signed   By: Bertina Broccoli M.D.   On: 06/05/2023 16:22   DG Wrist 2 Views Right Result Date: 06/05/2023 CLINICAL DATA:  Localized swelling of right hand. History of end-stage renal disease. EXAM: RIGHT HAND - 2 VIEW; RIGHT WRIST - 2 VIEW COMPARISON:  None Available. FINDINGS: Right wrist: 1 mm ulnar negative variance. Minimal triscaphe and thumb carpometacarpal joint space narrowing, subchondral cystic change, and peripheral osteophytosis. Surgical clips overlie the volar lateral/radial aspect of the wrist at the level of the distal radius. There are high-grade calcifications that appear to represent vascular calcifications associated with likely the radial artery, with possible vessel enlargement associated with volar lateral bulging of the overlying skin at the level of the distal radial metaphysis. Question a dialysis shunt in this region. Right hand: Mild thumb interphalangeal and second through fifth DIP joint space narrowing and peripheral osteophytosis. Moderate atherosclerotic calcifications. No acute fracture is seen. No dislocation. No cortical erosion. IMPRESSION: 1. 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 2. Minimal triscaphe and thumb carpometacarpal osteoarthritis. 3. High-grade calcifications that appear to represent vascular calcifications associated with likely the radial artery, with possible vessel enlargement associated with volar lateral bulging of the overlying skin at the level of the distal radial metaphysis. Question a dialysis shunt in this region. The patient does have a history of end-stage renal disease reported on indications for prior radiology studies on PACS. Electronically Signed   By: Bertina Broccoli M.D.   On: 06/05/2023 16:22   Medications:  ceFEPime  (MAXIPIME ) IV 1 g (06/05/23  1807)   vancomycin       (feeding supplement) PROSource Plus  30 mL Oral BID BM   atorvastatin   10 mg Oral Q Thu   [START ON 06/07/2023] calcitRIOL   1.5 mcg Oral Q M,W,F-HD   carbamide peroxide  5 drop Right EAR BID   Chlorhexidine  Gluconate Cloth  6 each Topical Q0600   Chlorhexidine  Gluconate Cloth  6 each Topical Q0600   Chlorhexidine  Gluconate Cloth  6 each Topical Q0600   cinacalcet   120 mg Oral Q supper   darbepoetin (ARANESP ) injection - DIALYSIS  100 mcg Subcutaneous Q Tue-1800   gabapentin   300 mg Oral QHS   Gerhardt's butt cream   Topical BID   midodrine   10 mg Oral Once in dialysis   pantoprazole   40 mg Oral BID AC   polyethylene glycol  17 g Oral TID   psyllium  1 packet Oral TID   senna-docusate  2 tablet Oral BID   sevelamer  carbonate  3,200 mg Oral TID WC   topiramate   25 mg Oral BID   vancomycin  variable dose per unstable renal function (pharmacist dosing)   Does not apply See admin instructions    Dialysis Orders [facility] - MWF 4h  B400   85.5kg   2K bath  AVF Heparin  3000 Mircera 60mcg IV q 2 weeks- last dose 05/24/23 Calcitriol   1.5mcg PO q HD   Assessment/Plan: 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 Now in CIR ESRD: Off schedule this week. S/p HD 4/20, 4/22. Next HD Friday 4/25 to get back on schedule. HTN/volume: BP stable, way up per weights. UF as tolerated. Anemia of ESRD: Hgb 8.7 - improving, continue Aranesp  q Tues while here. Secondary HPTH: CorrCa/Phos ok - continue sevelamer , calcitriol , sensipar  for now. Nutrition: Alb low, continue protein supps.   Janus Mercury, PA-C 06/06/2023, 12:06 PM  [facility]

## 2023-06-07 ENCOUNTER — Other Ambulatory Visit (HOSPITAL_COMMUNITY): Payer: Self-pay

## 2023-06-07 LAB — CULTURE, BLOOD (ROUTINE X 2)
Culture: NO GROWTH
Culture: NO GROWTH

## 2023-06-07 LAB — GLUCOSE, CAPILLARY: Glucose-Capillary: 107 mg/dL — ABNORMAL HIGH (ref 70–99)

## 2023-06-07 MED ORDER — SODIUM CHLORIDE 0.9 % IV SOLN
1.0000 g | INTRAVENOUS | Status: AC
  Administered 2023-06-08 (×2): 1 g via INTRAVENOUS
  Filled 2023-06-07 (×2): qty 10

## 2023-06-07 MED ORDER — HEPARIN SODIUM (PORCINE) 1000 UNIT/ML IJ SOLN
INTRAMUSCULAR | Status: AC
  Filled 2023-06-07: qty 3

## 2023-06-07 MED ORDER — HEPARIN SODIUM (PORCINE) 1000 UNIT/ML IJ SOLN
3000.0000 [IU] | Freq: Once | INTRAMUSCULAR | Status: AC
Start: 2023-06-07 — End: 2023-06-07
  Administered 2023-06-07: 3000 [IU] via INTRA_ARTERIAL

## 2023-06-07 NOTE — Progress Notes (Signed)
 Recreational Therapy Session Note  Patient Details  Name: Carl Beckford Sr. MRN: [id number] Date of Birth: 03/20/1960 Today's Date: 06/07/2023  Eval deferred until next week.  Pt had toileting need during scheduled therapy session. Dawsyn Zurn 06/07/2023, 12:30 PM

## 2023-06-07 NOTE — Progress Notes (Signed)
 [facility] Progress Note   Subjective: Seen on HD. No issues. No C/Os.    Objective Vitals:   06/06/23 1933 06/07/23 0614 06/07/23 0615 06/07/23 1543  BP: (!) 154/88 123/74  (!) 144/86  Pulse: 99 93  96  Resp: 19 18  (!) 22  Temp: 98.5 F (36.9 C) 98 F (36.7 C)    TempSrc:      SpO2: 99% 100%  98%  Weight:   111 kg 93.6 kg  Height:       General: Pleasant male, no acute distress, in  Neuro: C-spine collar in place Heart: S1.S2 No M/R/G Lungs: CTAB No WOB.  Abdomen: NABS, NT Extremities: No LE edema Dialysis Access:  RFA AVF +t/b  Additional Objective Labs: Basic Metabolic Panel: Recent Labs  Lab 06/02/23 0840 06/03/23 0556  NA 129* 131*  K 5.2* 5.1  CL 90* 92*  CO2 24 25  GLUCOSE 104* 102*  BUN 65* 48*  CREATININE 10.62* 8.71*  CALCIUM  7.6* 7.5*  PHOS 5.8* 4.3   Liver Function Tests: Recent Labs  Lab 06/02/23 0840 06/03/23 0556  ALBUMIN  2.4* 2.1*   No results for input(s): "LIPASE", "AMYLASE" in the last 168 hours. CBC: Recent Labs  Lab 06/02/23 0840 06/03/23 0556 06/04/23 0507  WBC 7.0 7.1 7.3  NEUTROABS  --  4.4  --   HGB 8.8* 7.5* 8.7*  HCT 27.1* 23.7* 27.8*  MCV 90.0 90.1 90.0  PLT 308 271 307   Blood Culture    Component Value Date/Time   SDES BLOOD LEFT HAND 06/02/2023 1743   SDES BLOOD LEFT HAND 06/02/2023 1743   SPECREQUEST  06/02/2023 1743    BOTTLES DRAWN AEROBIC AND ANAEROBIC Blood Culture results may not be optimal due to an inadequate volume of blood received in culture bottles   SPECREQUEST  06/02/2023 1743    AEROBIC BOTTLE ONLY Blood Culture results may not be optimal due to an inadequate volume of blood received in culture bottles   CULT  06/02/2023 1743    NO GROWTH 5 DAYS Performed at [facility], [address], [place], [place] [address]    CULT  06/02/2023 1743    NO GROWTH 5 DAYS Performed at [facility], [address] [place], [place] [address]    REPTSTATUS 06/07/2023 FINAL  06/02/2023 1743   REPTSTATUS 06/07/2023 FINAL 06/02/2023 1743    Cardiac Enzymes: No results for input(s): "CKTOTAL", "CKMB", "CKMBINDEX", "TROPONINI" in the last 168 hours. CBG: No results for input(s): "GLUCAP" in the last 168 hours. Iron Studies: No results for input(s): "IRON", "TIBC", "TRANSFERRIN", "FERRITIN" in the last 72 hours. @lablastinr3 @ Studies/Results: No results found. Medications:  ceFEPime  (MAXIPIME ) IV     vancomycin       (feeding supplement) PROSource Plus  30 mL Oral BID BM   atorvastatin   10 mg Oral Q Thu   calcitRIOL   1.5 mcg Oral Q M,W,F-HD   carbamide peroxide  5 drop Right EAR BID   Chlorhexidine  Gluconate Cloth  6 each Topical Q0600   Chlorhexidine  Gluconate Cloth  6 each Topical Q0600   Chlorhexidine  Gluconate Cloth  6 each Topical Q0600   cinacalcet   120 mg Oral Q supper   darbepoetin (ARANESP ) injection - DIALYSIS  100 mcg Subcutaneous Q Tue-1800   gabapentin   300 mg Oral QHS   Gerhardt's butt cream   Topical BID   midodrine   10 mg Oral Once in dialysis   pantoprazole   40 mg Oral BID AC  polyethylene glycol  17 g Oral TID   psyllium  1 packet Oral TID   senna-docusate  2 tablet Oral BID   sevelamer  carbonate  3,200 mg Oral TID WC   topiramate   25 mg Oral BID     Dialysis Orders [facility] - MWF 4h  B400   85.5kg   2K bath  AVF Heparin  3000 Mircera 60mcg IV q 2 weeks- last dose 05/24/23 Calcitriol  1.5mcg PO q HD   Assessment/Plan: 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 Now in CIR ESRD: Off schedule this week.Resume MWF schedule today.  Next HD 06/10/2023 HTN/volume: BP stable, way up per weights. UF as tolerated. Anemia of ESRD: Hgb 8.7 - improving, continue Aranesp  q Tues while here. Secondary HPTH: CorrCa/Phos ok - continue sevelamer , calcitriol , sensipar  for now. Nutrition: Alb low, continue protein supps.  Yliana Gravois H. Diora Bellizzi NP-C 06/07/2023, 4:00 PM  [employer] [phone]

## 2023-06-07 NOTE — Progress Notes (Addendum)
 Occupational Therapy Session Note  Patient Details  Name: Carl Lucus Sr. MRN: [id number] Date of Birth: 08-Nov-1960  Today's Date: 06/07/2023 OT Individual Time: 2725-3664+ 4034-7425 OT Individual Time Calculation (min): 73 min    Short Term Goals: Week 1:  OT Short Term Goal 1 (Week 1): Pt will perform toilet transfer with consistent Mod A (x1) + LRAD. OT Short Term Goal 1 - Progress (Week 1): Not met OT Short Term Goal 2 (Week 1): Pt will thread BLE into LB garments with Min A + LRAD. OT Short Term Goal 2 - Progress (Week 1): Not met OT Short Term Goal 3 (Week 1): Pt will bathe UB with Min A + LRAD. OT Short Term Goal 3 - Progress (Week 1): Not met  Skilled Therapeutic Interventions/Progress Updates:  Session 1: Pt greeted supine in bed, pt agreeable to OT intervention.    No pain reported during session  VS from supine-  126/83( 94) HR 97 bpm  Pt requested to have a short break from [place], therefore lowered pt down to supine position and doffed collar briefly to allow for skin check.   Donned teds from supine dependently. Transfers/bed mobility/functional mobility:  Pt completed supine>sit with MIN A to manage RLE and elevate trunk. HOB elevated during task.  Utilized stedy to transition pt to [facility]. Pt able to stand to stedy with MODA +2. Dependent transfer into PWC.   Pt completed w/c mobility to gym with supervision.  In gym, pt completed SB transfer to mat table to R side with total A for board placement but pt completed transfer with MOD A +2. MOD verbal cues needed to technique and set- up.  Utilized eva walker to work on sit>stands from [employer]. Pt completed 2 sit>stands with MODA +2 to rise from EOM. Emphasis on anterior pelvic tilt and elevating trunk while engaging glutes in standing. Pt even able to march in place in eva walker.   Pt completed additional SB transfer back to [facility] to L side with MIN A +2, total A for board placement but pt doing well with incremental scoots  and improving head/hips relationship .    Ended session with pt on stedy with nursing assisting pt to the bathroom.   Session 2:  Pt greeted seated in PWC, pt agreeable to OT intervention.     Cognition: pt on the phone with HD, pt upset that he hadn't been taken to HD yet and looked up the number to call inpatient HD. Pt then becoming frustrated that they changed his room with pt stating " I feel like they are trying to play me." This pt is new to this OTA today but its seems that pt was on [facility] rehab unit and then transferred to the west side of rehab. Provided education on the reason for transfer with NT also present providing clarity on reason for transfer. Pt noted to perseverate on this topic throughout session. Unsure if this is pts baseline or beginning to question some hospital delirium?   Transfers/bed mobility/functional mobility:  Pt completed w/c mobility to rehab gym in [facility] with supervision.   Exercises: pt completed below therex to facilitate improved bilateral FMC and AROM in BUEs. Pt did report unrated pain from collar during therex, adjusted collar as needed.  -pt instructed to grasp/release wash cloths on table with RUE as RUE seems to be worse than LUE and pt is R handed. Pt completed task with + time and with a gross grasp noted on RUE.  -  graded task up and had pt stack cones on side table with RUE. Pt noted to use his trunk to compensate for lack of isolated joint movement therefore provide MOD support distally with pt completing task with improvement.  Pt continues to have a gross grasp in R hand therefore big emphasis on intrinsic strength during task. Pt completed same cone stacking task with LUE with much more ease. -provided pt with unweighted dowel rod, pt completed chest presses to target. Pt presents with ataxic movements in RUE> LUE needing MOD support at R shoulder to complete task. Pt reports this task being very effortful.   Ended session with pt seated in PWC with  all needs within reach.   Therapy Documentation Precautions:  Precautions Precautions: Fall, Cervical, Other (comment) (HD MWF (RUE restricted).) Recall of Precautions/Restrictions: Impaired Precaution/Restrictions Comments: Verbally reviewed cervical precautions. Required Braces or Orthoses: Cervical Brace Cervical Brace: Hard collar Restrictions Weight Bearing Restrictions Per Provider Order: No   Therapy/Group: Individual Therapy   Willadean Hark 06/07/2023, 12:29 PM

## 2023-06-07 NOTE — Progress Notes (Signed)
 PROGRESS NOTE   Subjective/Complaints:  Pt reports no more vertigo this AM- had yesterday intermittently.  Tried feeding self with L hand yesterday Really likes the new/regular bed- easier to turn and long enough- doesn't slide down in bed.  LBM yesterday   ROS:    Pt denies SOB, abd pain, CP, N/V/C/D, and vision changes  Still having R hand swelling  Objective:   DG Hand 2 View Right Result Date: 06/05/2023 CLINICAL DATA:  Localized swelling of right hand. History of end-stage renal disease. EXAM: RIGHT HAND - 2 VIEW; RIGHT WRIST - 2 VIEW COMPARISON:  None Available. FINDINGS: Right wrist: 1 mm ulnar negative variance. Minimal triscaphe and thumb carpometacarpal joint space narrowing, subchondral cystic change, and peripheral osteophytosis. Surgical clips overlie the volar lateral/radial aspect of the wrist at the level of the distal radius. There are high-grade calcifications that appear to represent vascular calcifications associated with likely the radial artery, with possible vessel enlargement associated with volar lateral bulging of the overlying skin at the level of the distal radial metaphysis. Question a dialysis shunt in this region. Right hand: Mild thumb interphalangeal and second through fifth DIP joint space narrowing and peripheral osteophytosis. Moderate atherosclerotic calcifications. No acute fracture is seen. No dislocation. No cortical erosion. IMPRESSION: 1. 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 2. Minimal triscaphe and thumb carpometacarpal osteoarthritis. 3. High-grade calcifications that appear to represent vascular calcifications associated with likely the radial artery, with possible vessel enlargement associated with volar lateral bulging of the overlying skin at the level of the distal radial metaphysis. Question a dialysis shunt in this region. The patient does have a history  of end-stage renal disease reported on indications for prior radiology studies on PACS. Electronically Signed   By: Bertina Broccoli M.D.   On: 06/05/2023 16:22   DG Wrist 2 Views Right Result Date: 06/05/2023 CLINICAL DATA:  Localized swelling of right hand. History of end-stage renal disease. EXAM: RIGHT HAND - 2 VIEW; RIGHT WRIST - 2 VIEW COMPARISON:  None Available. FINDINGS: Right wrist: 1 mm ulnar negative variance. Minimal triscaphe and thumb carpometacarpal joint space narrowing, subchondral cystic change, and peripheral osteophytosis. Surgical clips overlie the volar lateral/radial aspect of the wrist at the level of the distal radius. There are high-grade calcifications that appear to represent vascular calcifications associated with likely the radial artery, with possible vessel enlargement associated with volar lateral bulging of the overlying skin at the level of the distal radial metaphysis. Question a dialysis shunt in this region. Right hand: Mild thumb interphalangeal and second through fifth DIP joint space narrowing and peripheral osteophytosis. Moderate atherosclerotic calcifications. No acute fracture is seen. No dislocation. No cortical erosion. IMPRESSION: 1. 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 2. Minimal triscaphe and thumb carpometacarpal osteoarthritis. 3. High-grade calcifications that appear to represent vascular calcifications associated with likely the radial artery, with possible vessel enlargement associated with volar lateral bulging of the overlying skin at the level of the distal radial metaphysis. Question a dialysis shunt in this region. The patient does have a history of end-stage renal disease reported on indications for prior radiology studies on PACS. Electronically Signed  By: Bertina Broccoli M.D.   On: 06/05/2023 16:22     No results for input(s): "WBC", "HGB", "HCT", "PLT" in the last 72 hours.  No results for input(s): "NA", "K",  "CL", "CO2", "GLUCOSE", "BUN", "CREATININE", "CALCIUM " in the last 72 hours.   Intake/Output Summary (Last 24 hours) at 06/07/2023 0825 Last data filed at 06/07/2023 0800 Gross per 24 hour  Intake 956 ml  Output --  Net 956 ml        Physical Exam: Vital Signs Blood pressure 123/74, pulse 93, temperature 98 F (36.7 C), resp. rate 18, height 6\' 1"  (1.854 m), weight 111 kg, SpO2 100%.     General: awake, alert, appropriate, supine in bed; NAD HENT: conjugate gaze; oropharynx moist CV: regular rate and rhythm- rate in 90's; no JVD Pulmonary: CTA B/L; no W/R/R- good air movement GI: soft, NT, ND, (+)BS Psychiatric: appropriate- brighter affect Neurological: Ox3  MSK- R hand swelling esp ulnar side of hand/dorsum and L wrist-stable this AM RUE- Biceps 4/5; WE 4-/5; Triceps 2/5; Grip 2-/5 and FA 1/5 LUE- 4/5 except triceps 3+ to 4-/5 RLE- HF 1 to 2-/5; KE 2-/5; DF/PF 2-/5 LLE- HF 3/5; KE 4-/5; DF/PF 4/5    Skin: C/D/I. Multiple lesions on BL legs with various stages of healing.  + RUE fistula - intact   MSK:      LLE knee with palpable effusion, no warmth, no ttp Mood: Pleasant affect, appropriate mood.  Neurologic exam: Alert and oriented x 4, fluent, follows commands, cranial nerves II through XII grossly intact, no hypertonia noted  Sensation: Reduced to light touch in distal right lower extremity to the mid-thigh       Strength:                RUE: 2/5 SA, 2/5 EF, 2/5 EE, 2/5 WE, 2/5 FF, 2/5 FA                LUE:  3/5 SA, 3/5 EF, 3/5 EE, 3/5 WE, 3/5 FF, 3/5 FA                RLE: 2/5 HF, 2/5 KE, 2/5  DF, 2/5  EHL, 2/5  PF                 LLE:  3/5 HF, 4/5 KE, 4/5  DF, 4/5  EHL, 4/5  PF  Neuroexam stable 4/21  Assessment/Plan: 1. Functional deficits which require 3+ hours per day of interdisciplinary therapy in a comprehensive inpatient rehab setting. Physiatrist is providing close team supervision and 24 hour management of active medical problems listed  below. Physiatrist and rehab team continue to assess barriers to discharge/monitor patient progress toward functional and medical goals  Care Tool:  Bathing    Body parts bathed by patient: Right arm, Chest, Abdomen, Right upper leg, Left upper leg, Face   Body parts bathed by helper: Left arm, Front perineal area, Buttocks, Right lower leg, Left lower leg     Bathing assist Assist Level: Moderate Assistance - Patient 50 - 74%     Upper Body Dressing/Undressing Upper body dressing   What is the patient wearing?: Pull over shirt    Upper body assist Assist Level: Moderate Assistance - Patient 50 - 74%    Lower Body Dressing/Undressing Lower body dressing      What is the patient wearing?: Pants     Lower body assist Assist for lower body dressing: Maximal Assistance - Patient 25 - 49%  Toileting Toileting Toileting Activity did not occur Press photographer and hygiene only): N/A (no void or bm) (anuric, HD patient)  Toileting assist Assist for toileting: Maximal Assistance - Patient 25 - 49%     Transfers Chair/bed transfer  Transfers assist     Chair/bed transfer assist level: 2 Helpers     Locomotion Ambulation   Ambulation assist   Ambulation activity did not occur: Safety/medical concerns          Walk 10 feet activity   Assist  Walk 10 feet activity did not occur: Safety/medical concerns        Walk 50 feet activity   Assist Walk 50 feet with 2 turns activity did not occur: Safety/medical concerns         Walk 150 feet activity   Assist Walk 150 feet activity did not occur: Safety/medical concerns         Walk 10 feet on uneven surface  activity   Assist Walk 10 feet on uneven surfaces activity did not occur: Safety/medical concerns         Wheelchair     Assist Is the patient using a wheelchair?: Yes Type of Wheelchair: Manual    Wheelchair assist level: Dependent - Patient 0% Max wheelchair distance:  150 ft    Wheelchair 50 feet with 2 turns activity    Assist        Assist Level: Dependent - Patient 0%   Wheelchair 150 feet activity     Assist      Assist Level: Dependent - Patient 0%   Blood pressure 123/74, pulse 93, temperature 98 F (36.7 C), resp. rate 18, height 6\' 1"  (1.854 m), weight 111 kg, SpO2 100%.  Medical Problem List and Plan: 1. 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  Antithrombotics: -DVT/anticoagulation:  Mechanical: Sequential compression devices, below knee Bilateral lower extremities             -antiplatelet therapy: N/A--no antiplatelet due to hx of recurrent GIB  4/25- d/w family- they agree that Lovenox  not appropriate at this time.  3. Pain Management:  oxycodone  prn. Gabapentin  300 mg/hs.              --has Flexeril  10 mg TID prn              - patient with intermittent, severe b/l headache - add topomax 25 mg BID    4/19- pt rating pain 07/19/08 which is "tolerable"  4/22- pain slightly better this AM  4/25- fewer complaints of pain 4. Mood/Behavior/Sleep: LCSW to follow for evaluation and support             -antipsychotic agents: N/A             --melatonin prn for insomnia.  5. Neuropsych/cognition: This patient is capable of making decisions on his own behalf. 6. Skin/Wound Care: Routine pressure  relief measures.   4/19- due to ulcers on legs, on low air loss mattress 7. Fluids/Electrolytes/Nutrition: Strict I/O. Daily weights. Labs with HD. 8. ESRD- HD MWF: Schedule HD at the end of the day to help with  tolerance of therapy             --Sensipar  and Renvela  for metabolic bone disease.   - Patient reports no longer makes urine  4/20- pt getting HD this AM- last HD was Wednesday.   4/21 dialysis moved to tomorrow  4/22- HD to be done today  4/25- Next HD today to get back on track 9. Anemia of chronic disease: Monitor H/H  -4/21 HGB lower 7.5, FOBT, recheck tomorrow  4/22- HB 8.7 on today's check-con't to monitor 10. H/o recurrent GIB/Jehovah's Witness: Continue Protonix  BID 11. H/o Hypotension: Has had drop  in BP with HD. Now cervical SCI.  --Monitor for symptoms day after HD and with activity.  -4/18 baseline blood pressure stable, monitor with activity 4/19- BP actually slightly elevated today- 140s-150's- con't regimen, since can drop with HD and therapy 4/20- BP actually doing better today- in 100s'110s- however HR elevated, likely to fluid overload 4/21 nephrology started midodrine  before dialysis, BP appears to be doing a little better today 4/22- less hypotensive this AM laying- will monitor for [facility] 4/23- Tachycardia slightly better- in high 90's- having some intermittent elevated BP's- will monitor 4/24- BP running 140-s150s, but don't want to drop it since having HD 4/25- Having Hypotension with therapy, so don't want to reduce BP at all- doing TED's and ACE wraps- occ elevated, but with HD and therapy, not appropriate to reduce levels    06/07/2023    6:15 AM 06/07/2023    6:14 AM 06/06/2023    7:33 PM  Vitals with BMI  Weight 244 lbs 11 oz    BMI 32.29    Systolic  123 154  Diastolic  74 88  Pulse  93 99    12. Constipation: Small BM on 04/14 --had of nulytey yesterday and another today?  -Xray 4/17 without excessive stool, patient reports stool still little hard yesterday.  Will increase MiraLAX  to 3 times a day, continue Senokot twice a day. Suppository PRN   4/25- LBM yesterday 13. ?left knee effusion. Not currently bothersome. Consider imaging.  14.  SIRS-  Fever/hypotension/tachycardia  - 4/21 Currently afebrile, he got to 50 cc IVF yesterday, CXR negative, EKG sinus tachycardia. Patient was started on Maxipime  and vancomycin  yesterday.  Appears to be doing much better today.  WBC not elevated at 7.1.  Continue to follow blood cultures  4/22- Blood Cx's (-) so far- WBC 7.3- will con't IV ABX until know final results of blood Cx's- CXR (-)- but temp was 103.2 yesterday overnight.   4/23- Blood C'x (-) x2 days so far- T 99.9 this Am after a few sips of coffee- was 100.4 this AM- will con't IV ABX since concerned about possible bacteremia- esp since having low grade temps still  4/24- No more fevers, even low grade- no growth on blood Cx' bu it says that didn't get enough blood, so much be falsely negative  4/25- No growth for 5 days, but it also said not enough blood to be sure was a true negative. Will change stop date til tomorrow 15. Tachycardia  4/22- doing better- right at 100 this AM- will monitor- likely can be from dregs of infection vs OH. -will monitor  4/23- Tachycardia improving- 4/24- HR in  90's right now- doing better  16. R hand swelling  4/23- doing ice- will do xrays  4/24- Xray doesn't look like cause of swelling, however could be due to new shunt? 17. Hypocalcemia  4/24- his Albumin  is 2.1, so probably ok, and phos coming down- will leave to renal- thank you for their help    I spent a total of  36  minutes on total care today- >50% coordination of care- due to  D/w pt about his vertigo- feeding self- and changed honeycomb to dry dressing and ot monitor daily.  Also d/w nursing about overnight  LOS: 8 days A FACE TO FACE EVALUATION WAS PERFORMED  Dwon Sky 06/07/2023, 8:25 AM

## 2023-06-07 NOTE — Plan of Care (Signed)
  Problem: RH Ambulation Goal: LTG Patient will ambulate in home environment (PT) Description: LTG: Patient will ambulate in home environment, # of feet with assistance (PT). Outcome: Not Applicable Flowsheets (Taken 06/07/2023 1454) LTG: Pt will ambulate in home environ  assist needed:: (D/C) --   Problem: RH Stairs Goal: LTG Patient will ambulate up and down stairs w/assist (PT) Description: LTG: Patient will ambulate up and down # of stairs with assistance (PT) Outcome: Not Applicable Flowsheets (Taken 06/07/2023 1454) LTG: Pt will ambulate up/down stairs assist needed:: (D/c due to safety at this time) -- Note: D/c due to safety at this time   Problem: Sit to Stand Goal: LTG:  Patient will perform sit to stand with assistance level (PT) Description: LTG:  Patient will perform sit to stand with assistance level (PT) Flowsheets (Taken 06/07/2023 1454) LTG: PT will perform sit to stand in preparation for functional mobility with assistance level: (downgraded due to progress) Moderate Assistance - Patient 50 - 74% Note: downgraded due to progress    Problem: RH Bed Mobility Goal: LTG Patient will perform bed mobility with assist (PT) Description: LTG: Patient will perform bed mobility with assistance, with/without cues (PT). Flowsheets (Taken 06/07/2023 1454) LTG: Pt will perform bed mobility with assistance level of: Supervision/Verbal cueing   Problem: RH Ambulation Goal: LTG Patient will ambulate in controlled environment (PT) Description: LTG: Patient will ambulate in a controlled environment, # of feet with assistance (PT). Flowsheets (Taken 06/07/2023 1454) LTG: Pt will ambulate in controlled environ  assist needed:: Other (Comment) LTG: Ambulation distance in controlled environment: 66' with Lift equipment/+2 for NMR Note: downgraded due to progress    Problem: RH Wheelchair Mobility Goal: LTG Patient will propel w/c in controlled environment (PT) Description: LTG: Patient  will propel wheelchair in controlled environment, # of feet with assist (PT) Flowsheets (Taken 06/07/2023 1454) LTG: Pt will propel w/c in controlled environ  assist needed:: (upgraded to reflect PWC) Independent with assistive device Note: Uprgraded to reflect PWC

## 2023-06-07 NOTE — Progress Notes (Signed)
   06/07/23 1914  Vitals  Pulse Rate 99  Resp (!) 25  BP 128/78  SpO2 100 %  O2 Device Room Air  During Treatment Monitoring  Blood Flow Rate (mL/min) 399 mL/min  Arterial Pressure (mmHg) -183.02 mmHg  Venous Pressure (mmHg) 233.12 mmHg  TMP (mmHg) 14.75 mmHg  Ultrafiltration Rate (mL/min) 1262 mL/min  Dialysate Flow Rate (mL/min) 299 ml/min  Dialysate Potassium Concentration 2  Dialysate Calcium  Concentration 2.5  Duration of HD Treatment -hour(s) 3 hour(s)  Cumulative Fluid Removed (mL) per Treatment  3000.21  HD Safety Checks Performed Yes  Intra-Hemodialysis Comments Tx completed   Received patient in bed to unit.  Alert and oriented.  Informed consent signed and in chart.   TX duration: 3.5 hours  Patient tolerated well.  Transported back to the room  Alert, without acute distress.  Hand-off given to patient's nurse.   Access used: RAVF Access issues: None  Total UF removed: 3.0 Medication(s) given: See Welford Haley, LPN  [facility]

## 2023-06-07 NOTE — Progress Notes (Signed)
 Physical Therapy Weekly Progress Note  Patient Details  Name: Carl Greenstreet Sr. MRN: [id number] Date of Birth: 1960/07/12  Beginning of progress report period: May 30, 2023 End of progress report period: June 07, 2023  Today's Date: 06/07/2023  Session #1: PT Individual Time: 4098-1191 PT Individual Time Calculation (min): 72 min  Pt finishing dressing change with nursing. Pt in PWC and noted to have significant windswept to the R - modified w/c adding abductor pads and utilized pool noodle to make longer due to pt's long femurs, better alignment noted. Educated on PWC functions during session, switched side of joystick to the L due to difficulty with RUE due to edema, and reviewed pressure relief techniques and pt able to return demonstrate. Pt performed w/c mobility at supervision level with PWC  functions. NMR for sit <> stands, standing balance, postural control retraining, and functional strengthening using Abraham Hoffmann Plus x 3 trials with pt up about 1-2 min each trial before fatigued. Attempted gait x 2 trials with pre gait weightshifting initially and pt unable even with support to advance RLE and difficulty sustaining a full standing position, started to sink down and reporting overall fatigue. Pt discouraged, but also reminded he worked on standing this morning as well and muscles can fatigue. Finished the adjustments to the w/c and returned back to room with all needs in reach pt supervision for driving PWC with control on L side now.   Vital Signs:  BP = 123/83 mmHg; HR = 105 bpm seated (Similar in standing) Pain: Pain Assessment Pain Scale: 0-10 Pain Score: 0-No pain    Session#2: PT Individual Time: 1402-1434 (32 min) Denies pain at this time. Reports feeling a little down and discouraged overall today. Pt doing well with adjustments to PWC and driving at mod I level this PM. Performed slideboard transfer on and off of Nustep with focus on head hips relationship, anterior  weightshift, and overall balance for transfer with min to light mod assist +2 for safety. NMR on Nustep on level 1 with BUE and BLE (used coban to keep BUE on the handrests due to decreased grip and PT maintained RLE in alignment manually) x 5.5 min with focus on reciprocal movement pattern retrainining, strengthening, and overall endurance. Returned to room and all needs in reach.    Patient has met 0 of 3 short term goals.  Pt has made slow progress this week. Pt has missed several therapy sessions due to unplanned HD and HD scheduling issues. He continues to require +2 assist for functional mobility and demonstrates limited ability for sit <> stands and standing tolerance requiring lift equipment or +2 assist. Pt's RUE with h/o of RTC injury and new onset of weakness due to SCI is also limiting functional use of RUE for mobility. Pt initiate slideboard transfers with overall mod+2 assist needed. Family conference held on 4/24 to review current level of status, recommendations, and goals. Likely plan will be SNF from here due to home environment is not w/c accessible, but encouraged family to participate in education in preparation for home after SNF.   Patient continues to demonstrate the following deficits muscle weakness, muscle joint tightness, and muscle paralysis, decreased cardiorespiratoy endurance, abnormal tone and decreased coordination, decreased memory, and decreased sitting balance, decreased standing balance, decreased postural control, and decreased balance strategies and therefore will continue to benefit from skilled PT intervention to increase functional independence with mobility.  Patient not progressing toward long term goals.  See goal revision..  Plan of care  revisions: Downgrading initial goals set for gait and stairs due to limited progress..  PT Short Term Goals Week 1:  PT Short Term Goal 1 (Week 1): pt will perform STS with RW with assist of 1 PT Short Term Goal 1 -  Progress (Week 1): Not met PT Short Term Goal 2 (Week 1): Pt will perform stand pivot with LRAD with assist of 1 PT Short Term Goal 2 - Progress (Week 1): Not met PT Short Term Goal 3 (Week 1): pt will initiate gait training PT Short Term Goal 3 - Progress (Week 1): Not met Week 2:  PT Short Term Goal 1 (Week 2): Pt will progress transfers to mod assist PT Short Term Goal 2 (Week 2): Pt will tolerate standing with lift equipment x 5 min preparation for gait PT Short Term Goal 3 (Week 2): Pt will be able to perform bed mobilty with min assist for supine <> sit  Skilled Therapeutic Interventions/Progress Updates:  Ambulation/gait training;Cognitive remediation/compensation;Discharge planning;DME/adaptive equipment instruction;Functional mobility training;Pain management;Psychosocial support;Therapeutic Activities;Splinting/orthotics;UE/LE Strength taining/ROM;Visual/perceptual remediation/compensation;Wheelchair Designer, jewellery;Therapeutic Exercise;Skin care/wound management;Patient/family education;Neuromuscular re-education;Disease management/prevention;Balance/vestibular training;Community reintegration;Functional electrical stimulation   Therapy Documentation Precautions:  Precautions Precautions: Fall, Cervical, Other (comment) (HD MWF (RUE restricted).) Recall of Precautions/Restrictions: Impaired Precaution/Restrictions Comments: Verbally reviewed cervical precautions. Required Braces or Orthoses: Cervical Brace Cervical Brace: Hard collar Restrictions Weight Bearing Restrictions Per Provider Order: No    Therapy/Group: Individual Therapy  Gita Lamb Amadeo June, PT, DPT, CBIS  06/07/2023, 11:44 AM

## 2023-06-08 DIAGNOSIS — I959 Hypotension, unspecified: Secondary | ICD-10-CM

## 2023-06-08 LAB — CBC WITH DIFFERENTIAL/PLATELET
Abs Immature Granulocytes: 0.04 10*3/uL (ref 0.00–0.07)
Basophils Absolute: 0.1 10*3/uL (ref 0.0–0.1)
Basophils Relative: 1 %
Eosinophils Absolute: 0.4 10*3/uL (ref 0.0–0.5)
Eosinophils Relative: 5 %
HCT: 26.2 % — ABNORMAL LOW (ref 39.0–52.0)
Hemoglobin: 8.5 g/dL — ABNORMAL LOW (ref 13.0–17.0)
Immature Granulocytes: 0 %
Lymphocytes Relative: 7 %
Lymphs Abs: 0.6 10*3/uL — ABNORMAL LOW (ref 0.7–4.0)
MCH: 28.8 pg (ref 26.0–34.0)
MCHC: 32.4 g/dL (ref 30.0–36.0)
MCV: 88.8 fL (ref 80.0–100.0)
Monocytes Absolute: 1.6 10*3/uL — ABNORMAL HIGH (ref 0.1–1.0)
Monocytes Relative: 17 %
Neutro Abs: 6.2 10*3/uL (ref 1.7–7.7)
Neutrophils Relative %: 70 %
Platelets: 301 10*3/uL (ref 150–400)
RBC: 2.95 MIL/uL — ABNORMAL LOW (ref 4.22–5.81)
RDW: 15.5 % (ref 11.5–15.5)
WBC: 8.9 10*3/uL (ref 4.0–10.5)
nRBC: 0 % (ref 0.0–0.2)

## 2023-06-08 LAB — GLUCOSE, CAPILLARY
Glucose-Capillary: 102 mg/dL — ABNORMAL HIGH (ref 70–99)
Glucose-Capillary: 91 mg/dL (ref 70–99)
Glucose-Capillary: 121 mg/dL — ABNORMAL HIGH (ref 70–99)
Glucose-Capillary: 99 mg/dL (ref 70–99)

## 2023-06-08 MED ORDER — VANCOMYCIN HCL IN DEXTROSE 1-5 GM/200ML-% IV SOLN
1000.0000 mg | Freq: Once | INTRAVENOUS | Status: AC
  Administered 2023-06-08: 1000 mg via INTRAVENOUS
  Filled 2023-06-08: qty 200

## 2023-06-08 NOTE — Plan of Care (Signed)
  Problem: Consults Goal: RH SPINAL CORD INJURY PATIENT EDUCATION Description:  See Patient Education module for education specifics.  Outcome: Progressing   Problem: SCI BOWEL ELIMINATION Goal: RH STG MANAGE BOWEL WITH ASSISTANCE Description: STG Manage Bowel with minimal Assistance. Outcome: Progressing Goal: RH STG SCI MANAGE BOWEL WITH MEDICATION WITH ASSISTANCE Description: STG SCI Manage bowel with medication with assistance. Outcome: Progressing   Problem: RH SKIN INTEGRITY Goal: RH STG SKIN FREE OF INFECTION/BREAKDOWN Description: Manage skin free of infection/breakdown with minimal assistance Outcome: Progressing   Problem: RH SAFETY Goal: RH STG ADHERE TO SAFETY PRECAUTIONS W/ASSISTANCE/DEVICE Description: STG Adhere to Safety Precautions With minimal Assistance/Device. Outcome: Progressing   Problem: RH PAIN MANAGEMENT Goal: RH STG PAIN MANAGED AT OR BELOW PT'S PAIN GOAL Description: Pain<4 w/ prns Outcome: Progressing   Problem: RH KNOWLEDGE DEFICIT SCI Goal: RH STG INCREASE KNOWLEDGE OF SELF CARE AFTER SCI Description: Manage increase knowledge of self care after SCI with minimal assistance using educational materials provided to son Outcome: Progressing

## 2023-06-08 NOTE — Progress Notes (Signed)
 PROGRESS NOTE   Subjective/Complaints:  Pt doing alright, slept ok, pain well managed, unsure of LBM but looks like he had one today after evals, anuric at baseline. Dialysis yesterday. Of note, looks like temp recorded last night of 100.3 but 98.3 two hours later. No other complaints or concerns.    ROS:    Pt denies SOB, abd pain, CP, N/V/C/D, and vision changes  Still having R hand swelling  Objective:   No results found.    No results for input(s): "WBC", "HGB", "HCT", "PLT" in the last 72 hours.  No results for input(s): "NA", "K", "CL", "CO2", "GLUCOSE", "BUN", "CREATININE", "CALCIUM " in the last 72 hours.   Intake/Output Summary (Last 24 hours) at 06/08/2023 1203 Last data filed at 06/08/2023 0739 Gross per 24 hour  Intake 598 ml  Output 3 ml  Net 595 ml        Physical Exam: Vital Signs Blood pressure 92/62, pulse 100, temperature 98.3 F (36.8 C), resp. rate 16, height 6\' 1"  (1.854 m), weight 89.6 kg, SpO2 98%.     General: awake, alert, appropriate, sitting at EOB; NAD, afebrile HENT: conjugate gaze; oropharynx moist CV: regular rate and rhythm- rate in 90's; no JVD Pulmonary: CTA B/L; no W/R/R- good air movement GI: soft, NT, ND, (+)BS Psychiatric: appropriate- brighter affect  PRIOR EXAMS: Neurological: Ox3  MSK- R hand swelling esp ulnar side of hand/dorsum and L wrist-stable this AM RUE- Biceps 4/5; WE 4-/5; Triceps 2/5; Grip 2-/5 and FA 1/5 LUE- 4/5 except triceps 3+ to 4-/5 RLE- HF 1 to 2-/5; KE 2-/5; DF/PF 2-/5 LLE- HF 3/5; KE 4-/5; DF/PF 4/5    Skin: C/D/I. Multiple lesions on BL legs with various stages of healing.  + RUE fistula - intact   MSK:      LLE knee with palpable effusion, no warmth, no ttp Mood: Pleasant affect, appropriate mood.  Neurologic exam: Alert and oriented x 4, fluent, follows commands, cranial nerves II through XII grossly intact, no hypertonia  noted  Sensation: Reduced to light touch in distal right lower extremity to the mid-thigh       Strength:                RUE: 2/5 SA, 2/5 EF, 2/5 EE, 2/5 WE, 2/5 FF, 2/5 FA                LUE:  3/5 SA, 3/5 EF, 3/5 EE, 3/5 WE, 3/5 FF, 3/5 FA                RLE: 2/5 HF, 2/5 KE, 2/5  DF, 2/5  EHL, 2/5  PF                 LLE:  3/5 HF, 4/5 KE, 4/5  DF, 4/5  EHL, 4/5  PF  Neuroexam stable 4/21  Assessment/Plan: 1. Functional deficits which require 3+ hours per day of interdisciplinary therapy in a comprehensive inpatient rehab setting. Physiatrist is providing close team supervision and 24 hour management of active medical problems listed below. Physiatrist and rehab team continue to assess barriers to discharge/monitor patient progress toward functional and medical goals  Care Tool:  Bathing    Body parts bathed by patient: Right arm, Chest, Abdomen, Right upper leg, Left upper leg, Face   Body parts bathed by helper: Left arm, Front perineal area, Buttocks, Right lower leg, Left lower leg     Bathing assist Assist Level: Moderate Assistance - Patient 50 - 74%     Upper Body Dressing/Undressing Upper body dressing   What is the patient wearing?: Pull over shirt    Upper body assist Assist Level: Moderate Assistance - Patient 50 - 74%    Lower Body Dressing/Undressing Lower body dressing      What is the patient wearing?: Pants     Lower body assist Assist for lower body dressing: Maximal Assistance - Patient 25 - 49%     Toileting Toileting Toileting Activity did not occur (Clothing management and hygiene only): N/A (no void or bm) (anuric, HD patient)  Toileting assist Assist for toileting: Maximal Assistance - Patient 25 - 49%     Transfers Chair/bed transfer  Transfers assist     Chair/bed transfer assist level: 2 Helpers     Locomotion Ambulation   Ambulation assist   Ambulation activity did not occur: Safety/medical concerns          Walk 10  feet activity   Assist  Walk 10 feet activity did not occur: Safety/medical concerns        Walk 50 feet activity   Assist Walk 50 feet with 2 turns activity did not occur: Safety/medical concerns         Walk 150 feet activity   Assist Walk 150 feet activity did not occur: Safety/medical concerns         Walk 10 feet on uneven surface  activity   Assist Walk 10 feet on uneven surfaces activity did not occur: Safety/medical concerns         Wheelchair     Assist Is the patient using a wheelchair?: Yes Type of Wheelchair: Power    Wheelchair assist level: Supervision/Verbal cueing Max wheelchair distance: 150'    Wheelchair 50 feet with 2 turns activity    Assist        Assist Level: Supervision/Verbal cueing   Wheelchair 150 feet activity     Assist      Assist Level: Supervision/Verbal cueing   Blood pressure 92/62, pulse 100, temperature 98.3 F (36.8 C), resp. rate 16, height 6\' 1"  (1.854 m), weight 89.6 kg, SpO2 98%.  Medical Problem List and Plan: 1. 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  Antithrombotics: -DVT/anticoagulation:  Mechanical: Sequential compression devices, below knee Bilateral lower extremities             -antiplatelet therapy: N/A--no antiplatelet due to hx of recurrent GIB  4/25- d/w family- they agree that Lovenox  not appropriate at this time.  3. Pain Management:  oxycodone  prn. Gabapentin  300 mg/hs.              --has Flexeril  10 mg  TID prn              - patient with intermittent, severe  b/l headache - add topomax 25 mg BID    4/19- pt rating pain 07/19/08 which is "tolerable"  4/22- pain slightly better this AM  4/25- fewer complaints of pain 4. Mood/Behavior/Sleep: LCSW to follow for evaluation and support             -antipsychotic agents: N/A             --melatonin prn for insomnia.  5. Neuropsych/cognition: This patient is capable of making decisions on his own behalf. 6. Skin/Wound Care: Routine pressure relief measures.   4/19- due to ulcers on legs, on low air loss mattress 7. Fluids/Electrolytes/Nutrition: Strict I/O. Daily weights. Labs with HD. 8. ESRD- HD MWF: Schedule HD at the end of the day to help with tolerance of therapy             --Sensipar  and Renvela  for metabolic bone disease.   - Patient reports no longer makes urine  4/20- pt getting HD this AM- last HD was Wednesday.   4/21 dialysis moved to tomorrow  4/22- HD to be done today  4/25- Next HD today to get back on track 9. Anemia of chronic disease: Monitor H/H, aranesp   -4/21 HGB lower 7.5, FOBT, recheck tomorrow  4/22- HB 8.7 on today's check-con't to monitor 10. H/o recurrent GIB/Jehovah's Witness: Continue Protonix  BID 11. H/o Hypotension: Has had drop  in BP with HD. Now cervical SCI.  --Monitor for symptoms day after HD and with activity. -4/18 baseline blood pressure stable, monitor with activity 4/19- BP actually slightly elevated today- 140s-150's- con't regimen, since can drop with HD and therapy 4/20- BP actually doing better today- in 100s'110s- however HR elevated, likely to fluid overload 4/21 nephrology started midodrine  10mg  before dialysis, BP appears to be doing a little better today 4/22- less hypotensive this AM laying- will monitor for [facility] 4/23- Tachycardia slightly better- in high 90's- having some intermittent elevated BP's- will monitor 4/24- BP running 140-s150s, but don't want to drop it since having HD 4/25- Having Hypotension with therapy, so don't want to reduce BP at  all- doing TED's and ACE wraps- occ elevated, but with HD and therapy, not appropriate to reduce levels -06/08/23 BPs a little soft but HD yesterday; cont TEDs/ACE  Vitals:   06/07/23 0614 06/07/23 1543 06/07/23 1605 06/07/23 1700  BP: 123/74 (!) 144/86 (!) 142/84 119/80   06/07/23 1730 06/07/23 1800 06/07/23 1830 06/07/23 1900  BP: 138/83 119/80 107/81 114/78   06/07/23 1914 06/07/23 1938 06/08/23 0315 06/08/23 0509  BP: 128/78 127/77 95/65 92/62      12. Constipation: Small BM on 04/14 --had of nulytey yesterday and another today? -Xray 4/17 without excessive stool, patient reports stool still little hard yesterday.  Will increase MiraLAX  to 3 times a day, continue Senokot twice a day. Suppository PRN   4/26- LBM today, cont regimen 13. ?left knee effusion. Not currently bothersome. Consider imaging.  14.  SIRS- Fever/hypotension/tachycardia - 4/21 Currently afebrile, he got to 50 cc IVF yesterday, CXR negative, EKG sinus tachycardia. Patient was started on Maxipime  and vancomycin  yesterday.  Appears to be doing much better today.  WBC not elevated at 7.1.  Continue to follow blood cultures 4/22- Blood Cx's (-) so far- WBC 7.3- will con't IV ABX until know final results of blood Cx's- CXR (-)- but temp was 103.2 yesterday overnight.  4/23- Blood  C'x (-) x2 days so far- T 99.9 this Am after a few sips of coffee- was 100.4 this AM- will con't IV ABX since concerned about possible bacteremia- esp since having low grade temps still 4/24- No more fevers, even low grade- no growth on blood Cx' bu it says that didn't get enough blood, so much be falsely negative 4/25- No growth for 5 days, but it also said not enough blood to be sure was a true negative. Will change stop date til tomorrow -06/08/23 at 3am temp recorded as 100.3 but 5am was 98.3; will get CBC w/diff, monitor temps closely today, if continued low grade temps, will reach out to ID. BCx NGTD x5d. Didn't get vanc yesterday? Or  cefepime ? Unclear why. Cefepime  started this morning at 0011. 15. Tachycardia 4/22- doing better- right at 100 this AM- will monitor- likely can be from dregs of infection vs OH. -will monitor  4/23- Tachycardia improving- 4/24-26 HR in 90's right now- doing better  16. R hand swelling  4/23- doing ice- will do xrays 4/24- Xray doesn't look like cause of swelling, however could be due to new shunt? 17. Hypocalcemia  4/24- his Albumin  is 2.1, so probably ok, and phos coming down- will leave to renal- thank you for their help     LOS: 9 days A FACE TO [facility] EVALUATION WAS PERFORMED  [address] 06/08/2023, 12:03 PM

## 2023-06-09 ENCOUNTER — Inpatient Hospital Stay (HOSPITAL_COMMUNITY)

## 2023-06-09 DIAGNOSIS — R509 Fever, unspecified: Secondary | ICD-10-CM

## 2023-06-09 DIAGNOSIS — N186 End stage renal disease: Secondary | ICD-10-CM

## 2023-06-09 DIAGNOSIS — Z992 Dependence on renal dialysis: Secondary | ICD-10-CM

## 2023-06-09 LAB — GLUCOSE, CAPILLARY
Glucose-Capillary: 110 mg/dL — ABNORMAL HIGH (ref 70–99)
Glucose-Capillary: 140 mg/dL — ABNORMAL HIGH (ref 70–99)
Glucose-Capillary: 116 mg/dL — ABNORMAL HIGH (ref 70–99)
Glucose-Capillary: 126 mg/dL — ABNORMAL HIGH (ref 70–99)

## 2023-06-09 LAB — HEPATITIS B SURFACE ANTIGEN: Hepatitis B Surface Ag: NONREACTIVE

## 2023-06-09 MED ORDER — DARBEPOETIN ALFA 150 MCG/0.3ML IJ SOSY
150.0000 ug | PREFILLED_SYRINGE | INTRAMUSCULAR | Status: DC
  Administered 2023-06-11 – 2023-07-02 (×4): 150 ug via SUBCUTANEOUS
  Filled 2023-06-09 (×4): qty 0.3

## 2023-06-09 NOTE — Progress Notes (Signed)
 Spoke to son this am after nephrologist spoke to him on rounds and pt has questions about fevers and MS changes. Pt has had poor sleep , also taking more percocet yesterday On broad spectrum antibiotic vanc and cefipime (which can cause neurotoxicity by inhibiting GABA) Tmax 100 yesterday   Will ask ID to weigh in on need for abx for low grade temp and lack of leukocytosis and, -CXR, and -blood cx

## 2023-06-09 NOTE — Plan of Care (Signed)
  Problem: Consults Goal: RH SPINAL CORD INJURY PATIENT EDUCATION Description:  See Patient Education module for education specifics.  Outcome: Progressing   Problem: SCI BOWEL ELIMINATION Goal: RH STG MANAGE BOWEL WITH ASSISTANCE Description: STG Manage Bowel with minimal Assistance. Outcome: Progressing Goal: RH STG SCI MANAGE BOWEL WITH MEDICATION WITH ASSISTANCE Description: STG SCI Manage bowel with medication with assistance. Outcome: Progressing   Problem: RH SAFETY Goal: RH STG ADHERE TO SAFETY PRECAUTIONS W/ASSISTANCE/DEVICE Description: STG Adhere to Safety Precautions With minimal Assistance/Device. Outcome: Progressing   Problem: RH KNOWLEDGE DEFICIT SCI Goal: RH STG INCREASE KNOWLEDGE OF SELF CARE AFTER SCI Description: Manage increase knowledge of self care after SCI with minimal assistance using educational materials provided to son Outcome: Progressing

## 2023-06-09 NOTE — Progress Notes (Signed)
 Upon entering the room, this patient requested that this nurse extend the legs on the bed. This nurse attempted to do so, however, the bed was at the maximum extension. This nurse then notified the nurse that it was at the maximum. This patient then requested that this nurse pull him up in bed to which the nurse replied, "Of course, I just need a second person to help me." The patient then insisted that this nurse pull him up in the bed by himself stating, "You're a strong guy, I see those muscles. My son threw me up in the back of the truck from the ground by himself." To which this nurse told the patient, "That's fine but I cannot pull you up by myself. It's not within our practices to do it like that. We need a second person." This patient then became even more agitated and began insulting this nurse stating, "You're not a disabled veteran, you're raggedy." This nurse then stated, "Britt Candle, have a nice day," and proceeded to leave the room and notify the Statistician. Hand off given to [facility] LPN.

## 2023-06-09 NOTE — Progress Notes (Signed)
 Occupational Therapy Session Note  Patient Details  Name: Carl Tiedt Sr. MRN: [id number] Date of Birth: [birth year]  Today's Date: 06/09/2023 OT Individual Time: 4132-4401 OT Individual Time Calculation (min): 68 min    Short Term Goals: Week 2:  OT Short Term Goal 1 (Week 2): Pt will complete bed mobility at Mod A in preparation for LB dressing at bed level OT Short Term Goal 2 (Week 2): Pt will complete self feeding at Min A with LUE with AE as necessary OT Short Term Goal 3 (Week 2): Pt will complete UB dressing at Mod A  Skilled Therapeutic Interventions/Progress Updates:      Therapy Documentation Precautions:  Precautions Precautions: Fall, Cervical, Other (comment) (HD MWF (RUE restricted).) Recall of Precautions/Restrictions: Impaired Precaution/Restrictions Comments: Verbally reviewed cervical precautions. Required Braces or Orthoses: Cervical Brace Cervical Brace: Hard collar Restrictions Weight Bearing Restrictions Per Provider Order: No General: "I did not have a good night" Pt supine in bed upon OT arrival, agreeable to OT session.  Pain:  unrated pain reported in neck/Rt knee, positioning activity, intermittent rest breaks, distractions provided for pain management, pt reports tolerable to proceed.   ADL: OT providing skilled intervention on ADL retraining in order to increase independence with tasks and increase activity tolerance. Pt completed the following tasks at the current level of assist: Bed mobility: Mod A with supine><EOB with assistance for BLE UB dressing: Mod A, assistance required for thoroughness for UE although pt able to thread in sleeves most of the way and assistance with managing overhead LB dressing: total A bed level, rolling side to side to manage over waist Footwear: total A for donning TEDs supine in bed Transfers: Mod A with transfer board from EOB>PWC, VC for hand placement and increased clearance for buttocks for skin  protection   Exercises: Pt completed the following exercise circuit in order to improve UE functional activity, strength and endurance to prepare for ADLs such as UB dressing and transfers. Pt completed the following exercises in seated position with no noted LOB/SOB and various repetitions on each exercise with 1# dowel rod and BUE ACE wrapped for compensatory strategy for decreased grip: -bicep curls 3x10 -chest press with AAROM with PWC in tilt, 3x8 -shoulder flexion AAROM with PWC in tilt, 3x5  Pt completed exercises with PRN rest breaks d/t fatigue in UE   Pt supine in bed with handoff to NT for CT scan   Therapy/Group: Individual Therapy  [name], OTD, OTR/L 06/09/2023, 12:30 PM

## 2023-06-09 NOTE — Progress Notes (Signed)
 [facility] Progress Note   Subjective:     Objective Vitals:   06/08/23 1919 06/09/23 0444 06/09/23 0641 06/09/23 0848  BP:  109/65    Pulse:  93    Resp: 18 18    Temp: 99 F (37.2 C) 98.4 F (36.9 C)  98.6 F (37 C)  TempSrc:  Oral  Oral  SpO2: 95% 99%    Weight:   91.7 kg   Height:       Physical Exam General: Heart: Lungs: Abdomen: Extremities: Dialysis Access:   Dialysis Orders:  Additional Objective Labs: Basic Metabolic Panel: Recent Labs  Lab 06/03/23 0556  NA 131*  K 5.1  CL 92*  CO2 25  GLUCOSE 102*  BUN 48*  CREATININE 8.71*  CALCIUM  7.5*  PHOS 4.3   Liver Function Tests: Recent Labs  Lab 06/03/23 0556  ALBUMIN  2.1*   No results for input(s): "LIPASE", "AMYLASE" in the last 168 hours. CBC: Recent Labs  Lab 06/03/23 0556 06/04/23 0507 06/08/23 1248  WBC 7.1 7.3 8.9  NEUTROABS 4.4  --  6.2  HGB 7.5* 8.7* 8.5*  HCT 23.7* 27.8* 26.2*  MCV 90.1 90.0 88.8  PLT 271 307 301   Blood Culture    Component Value Date/Time   SDES BLOOD LEFT HAND 06/02/2023 1743   SDES BLOOD LEFT HAND 06/02/2023 1743   SPECREQUEST  06/02/2023 1743    BOTTLES DRAWN AEROBIC AND ANAEROBIC Blood Culture results may not be optimal due to an inadequate volume of blood received in culture bottles   SPECREQUEST  06/02/2023 1743    AEROBIC BOTTLE ONLY Blood Culture results may not be optimal due to an inadequate volume of blood received in culture bottles   CULT  06/02/2023 1743    NO GROWTH 5 DAYS Performed at [facility], [address] [place], [place] [address]    CULT  06/02/2023 1743    NO GROWTH 5 DAYS Performed at [facility], [address], [place], [place] [address]    REPTSTATUS 06/07/2023 FINAL 06/02/2023 1743   REPTSTATUS 06/07/2023 FINAL 06/02/2023 1743    Cardiac Enzymes: No results for input(s): "CKTOTAL", "CKMB", "CKMBINDEX", "TROPONINI" in the last 168 hours. CBG: Recent Labs  Lab 06/08/23 1142  06/08/23 1615 06/08/23 2050 06/09/23 0609 06/09/23 1103  GLUCAP 91 121* 99 110* 140*   Iron Studies: No results for input(s): "IRON", "TIBC", "TRANSFERRIN", "FERRITIN" in the last 72 hours. @lablastinr3 @ Studies/Results: No results found. Medications:   (feeding supplement) PROSource Plus  30 mL Oral BID BM   atorvastatin   10 mg Oral Q Thu   calcitRIOL   1.5 mcg Oral Q M,W,F-HD   carbamide peroxide  5 drop Right EAR BID   Chlorhexidine  Gluconate Cloth  6 each Topical Q0600   cinacalcet   120 mg Oral Q supper   darbepoetin (ARANESP ) injection - DIALYSIS  100 mcg Subcutaneous Q Tue-1800   gabapentin   300 mg Oral QHS   Gerhardt's butt cream   Topical BID   midodrine   10 mg Oral Once in dialysis   pantoprazole   40 mg Oral BID AC   polyethylene glycol  17 g Oral TID   psyllium  1 packet Oral TID   senna-docusate  2 tablet Oral BID   sevelamer  carbonate  3,200 mg Oral TID WC   topiramate   25 mg Oral BID     Physical Exam  General: Pleasant male, no acute distress, in  Neuro: C-spine collar in place Heart: S1.S2  No M/R/G Lungs: CTAB No WOB.  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 Now in CIR ESRD: Off schedule this week.Resume MWF schedule today.  Next HD 06/10/2023 HTN/volume: BP stable, Still very much above OP EDW.  UF as tolerated. Anemia of ESRD: Hgb 8.5- improving, continue Aranesp  q Tues while here-dose increased. Secondary HPTH: CorrCa/Phos ok - continue sevelamer , calcitriol , sensipar  for now. Nutrition: Alb low, continue protein supps.  Larina Lieurance H. Shaili Donalson NP-C 06/09/2023, 12:37 PM  [employer] [phone]

## 2023-06-09 NOTE — Progress Notes (Signed)
 PROGRESS NOTE   Subjective/Complaints:  Pt doing ok, but son with concerns about pt's mental status/confusion. Feels he's been a bit more confused and asking odd questions at times. Wants to have a head CT done. Wonders about his elevated temps yesterday. Head CT and dopplers ordered for today, ID consult to be done today.  Son also thinks it might be because he's not sleeping well, states he didn't sleep well d/t interruptions all night. Talked to nursing about minimizing interruptions between 11p-5a. Apparently can't put sign up, but will communicate in huddle.  Son thought tomorrow was the timeframe he could remove collar, nervous to do so, will order soft collar for when not wearing miami J, as a reminder to avoid extreme flexion/extension.  Pain overall well managed, has been using more percocets though (2 tabs x2 yesterday), due to R knee pain primarily.  LBM yesterday. Anuric at baseline.  No further elevated temps, yesterday 100.3 at 3am, and 100 midday, but none since.  No other complaints or concerns.    ROS:    Pt denies SOB, abd pain, CP, cough, N/V/C/D, and vision changes  Still having R hand swelling  Objective:   No results found.    Recent Labs    06/08/23 1248  WBC 8.9  HGB 8.5*  HCT 26.2*  PLT 301    No results for input(s): "NA", "K", "CL", "CO2", "GLUCOSE", "BUN", "CREATININE", "CALCIUM " in the last 72 hours.   Intake/Output Summary (Last 24 hours) at 06/09/2023 1227 Last data filed at 06/09/2023 0757 Gross per 24 hour  Intake 377 ml  Output --  Net 377 ml        Physical Exam: Vital Signs Blood pressure 109/65, pulse 93, temperature 98.6 F (37 C), temperature source Oral, resp. rate 18, height 6\' 1"  (1.854 m), weight 91.7 kg, SpO2 99%.     General: awake, alert, appropriate, resting in bed; NAD, afebrile HENT: conjugate gaze; oropharynx moist, miami J donned CV: regular rate and  rhythm- rate in 90's; no JVD Pulmonary: CTA B/L; no W/R/R- good air movement GI: soft, NT, ND, (+)BS Psychiatric: appropriate- brighter affect Neuro: speech intelligible, but sometimes needs to repeat things. Memory seems intact, doesn't appear confused or altered.  MsK: B/L knees with effusions, no erythema, not really focally warm to touch Skin: no evidence of cellulitis to extremities.   PRIOR EXAMS: Neurological: Ox3  MSK- R hand swelling esp ulnar side of hand/dorsum and L wrist-stable this AM RUE- Biceps 4/5; WE 4-/5; Triceps 2/5; Grip 2-/5 and FA 1/5 LUE- 4/5 except triceps 3+ to 4-/5 RLE- HF 1 to 2-/5; KE 2-/5; DF/PF 2-/5 LLE- HF 3/5; KE 4-/5; DF/PF 4/5    Skin: C/D/I. Multiple lesions on BL legs with various stages of healing.  + RUE fistula - intact   MSK:      LLE knee with palpable effusion, no warmth, no ttp Mood: Pleasant affect, appropriate mood.  Neurologic exam: Alert and oriented x 4, fluent, follows commands, cranial nerves II through XII grossly intact, no hypertonia noted  Sensation: Reduced to light touch in distal right lower extremity to the mid-thigh       Strength:  RUE: 2/5 SA, 2/5 EF, 2/5 EE, 2/5 WE, 2/5 FF, 2/5 FA                LUE:  3/5 SA, 3/5 EF, 3/5 EE, 3/5 WE, 3/5 FF, 3/5 FA                RLE: 2/5 HF, 2/5 KE, 2/5  DF, 2/5  EHL, 2/5  PF                 LLE:  3/5 HF, 4/5 KE, 4/5  DF, 4/5  EHL, 4/5  PF  Neuroexam stable 4/21  Assessment/Plan: 1. Functional deficits which require 3+ hours per day of interdisciplinary therapy in a comprehensive inpatient rehab setting. Physiatrist is providing close team supervision and 24 hour management of active medical problems listed below. Physiatrist and rehab team continue to assess barriers to discharge/monitor patient progress toward functional and medical goals  Care Tool:  Bathing    Body parts bathed by patient: Right arm, Chest, Abdomen, Right upper leg, Left upper leg, Face   Body  parts bathed by helper: Left arm, Front perineal area, Buttocks, Right lower leg, Left lower leg     Bathing assist Assist Level: Moderate Assistance - Patient 50 - 74%     Upper Body Dressing/Undressing Upper body dressing   What is the patient wearing?: Pull over shirt    Upper body assist Assist Level: Moderate Assistance - Patient 50 - 74%    Lower Body Dressing/Undressing Lower body dressing      What is the patient wearing?: Pants     Lower body assist Assist for lower body dressing: Maximal Assistance - Patient 25 - 49%     Toileting Toileting Toileting Activity did not occur (Clothing management and hygiene only): N/A (no void or bm) (anuric, HD patient)  Toileting assist Assist for toileting: Maximal Assistance - Patient 25 - 49%     Transfers Chair/bed transfer  Transfers assist     Chair/bed transfer assist level: 2 Helpers     Locomotion Ambulation   Ambulation assist   Ambulation activity did not occur: Safety/medical concerns          Walk 10 feet activity   Assist  Walk 10 feet activity did not occur: Safety/medical concerns        Walk 50 feet activity   Assist Walk 50 feet with 2 turns activity did not occur: Safety/medical concerns         Walk 150 feet activity   Assist Walk 150 feet activity did not occur: Safety/medical concerns         Walk 10 feet on uneven surface  activity   Assist Walk 10 feet on uneven surfaces activity did not occur: Safety/medical concerns         Wheelchair     Assist Is the patient using a wheelchair?: Yes Type of Wheelchair: Power    Wheelchair assist level: Supervision/Verbal cueing Max wheelchair distance: 150'    Wheelchair 50 feet with 2 turns activity    Assist        Assist Level: Supervision/Verbal cueing   Wheelchair 150 feet activity     Assist      Assist Level: Supervision/Verbal cueing   Blood pressure 109/65, pulse 93, temperature 98.6  F (37 C), temperature source Oral, resp. rate 18, height 6\' 1"  (1.854 m), weight 91.7 kg, SpO2 99%.  Medical Problem List and Plan: 1. 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  Antithrombotics: -DVT/anticoagulation:  Mechanical: Sequential compression devices, below knee Bilateral lower extremities             -antiplatelet therapy: N/A--no antiplatelet due to hx of recurrent GIB  4/25- d/w family- they agree that Lovenox  not appropriate at this time.   -06/09/23 ordered dopplers to r/o clots given low grade temps recently 3. Pain Management:  oxycodone  prn. Gabapentin  300 mg/hs.              --has Flexeril  10 mg TID prn              - patient with intermittent, severe b/l headache - add topomax 25 mg BID    4/19- pt rating pain 07/19/08 which is "tolerable"  4/22- pain slightly better this AM  4/25- fewer complaints of pain -06/09/23 having more b/l knee pain and using percocets more-- could be causing confusion? Advised judicious use, might benefit from knee aspirations/injections? 4. Mood/Behavior/Sleep: LCSW to follow for evaluation and support             -antipsychotic agents: N/A             --melatonin prn for insomnia.  -06/09/23 poor sleep, d/t interruptions, asked nursing to minimize interruptions between 11p-5a (apparently they can't put a sign up, but will communicate in huddle) 5.  Neuropsych/cognition: This patient is capable of making decisions on his own behalf. -06/09/23 son concerned with confusion, could be from poor sleep/pain meds vs cefepime  toxicity; getting head CT, dopplers, consulted ID Dr. Levern Reader as below 6. Skin/Wound Care: Routine pressure relief measures.   4/19- due to ulcers on legs, on low air loss mattress 7. Fluids/Electrolytes/Nutrition: Strict I/O. Daily weights. Labs with HD. 8. ESRD- HD MWF: Schedule HD at the end of the day to help with tolerance of therapy             --Sensipar  and Renvela  for metabolic bone disease.   - Patient reports no longer makes urine  4/20- pt getting HD this AM- last HD was Wednesday.   4/21 dialysis moved to tomorrow  4/22- HD to be done today  4/25- Next HD today to get back on track 9. Anemia of chronic disease: Monitor H/H, aranesp   -4/21 HGB lower 7.5, FOBT, recheck tomorrow  4/22- HB 8.7 on today's check-con't to monitor  -06/09/23 hgb 8.5 yesterday 10. H/o recurrent GIB/Jehovah's Witness: Continue Protonix  BID 11. H/o Hypotension: Has had drop  in BP with HD. Now cervical SCI.  --Monitor for symptoms day after HD and with activity. -4/18 baseline blood pressure stable, monitor with activity 4/19- BP actually slightly elevated today- 140s-150's- con't regimen, since can drop with HD and therapy 4/20- BP actually doing better today- in 100s'110s- however HR elevated, likely to fluid overload  4/21 nephrology started midodrine  10mg  before dialysis, BP appears to be doing a little better today 4/22- less hypotensive this AM laying- will monitor for [facility] 4/23- Tachycardia slightly better- in high 90's- having some intermittent elevated BP's- will monitor 4/24- BP running 140-s150s, but don't want to drop it since having HD 4/25- Having Hypotension with therapy, so don't want to reduce BP at all- doing TED's and ACE wraps- occ elevated, but with HD and therapy, not appropriate to reduce levels -06/08/23 BPs a little  soft but HD yesterday; cont TEDs/ACE -06/09/23 BPs good, monitor  Vitals:   06/07/23 1700 06/07/23 1730 06/07/23 1800 06/07/23 1830  BP: 119/80 138/83 119/80 107/81   06/07/23 1900 06/07/23 1914 06/07/23 1938 06/08/23 0315  BP: 114/78 128/78 127/77 95/65   06/08/23 0509 06/08/23 1343 06/08/23 1420 06/09/23 0444  BP: 92/62 120/79 104/72 109/65     12. Constipation: Small BM on 04/14 --had of nulytey yesterday and another today? -Xray 4/17 without excessive stool, patient reports stool still little hard yesterday.  Will increase MiraLAX  to 3 times a day, continue Senokot twice a day. Suppository PRN   06/09/23 LBM yesterday, cont regimen 13. ?left knee effusion. Not currently bothersome. Consider imaging.  14.  SIRS- Fever/hypotension/tachycardia - 4/21 Currently afebrile, he got to 50 cc IVF yesterday, CXR negative, EKG sinus tachycardia. Patient was started on Maxipime  and vancomycin  yesterday.  Appears to be doing much better today.  WBC not elevated at 7.1.  Continue to follow blood cultures 4/22- Blood Cx's (-) so far- WBC 7.3- will con't IV ABX until know final results of blood Cx's- CXR (-)- but temp was 103.2 yesterday overnight.  4/23- Blood C'x (-) x2 days so far- T 99.9 this Am after a few sips of coffee- was 100.4 this AM- will con't IV ABX since concerned about possible bacteremia- esp since having low grade temps still 4/24- No more fevers, even low grade- no growth on blood Cx' bu it says that didn't get enough blood, so much be falsely negative 4/25- No growth for 5 days, but it also said not enough blood to be sure was a true negative. Will change stop date til tomorrow -06/08/23 at 3am temp recorded as 100.3 but 5am was 98.3; will get CBC w/diff, monitor temps closely today, if continued low grade temps, will reach out to ID. BCx NGTD x5d. Didn't get vanc yesterday? Or cefepime ? Unclear why. Cefepime  started this morning at 0011. -06/09/23 temp 100.3 at 3am yesterday and  then 100 later in the day, but no fevers since, no tachycardia.  -WBC yesterday 8.9.  -Now with some confusion; spoke with Dr. Levern Reader of ID, stated cefepime  could be the cause of the confusion -recommended we stop all abx (which ended yesterday anyway) -watch the patient OFF abx -if fevers >100.3 occur then she recommended repeat BCx and CXR and could call her back to see if any abx would be advised at that time.  -Head CT and dopplers ordered as well, as above 15. Tachycardia 4/22- doing better- right at 100 this AM- will monitor- likely can be from dregs of infection vs OH. -will monitor  4/23- Tachycardia improving- 4/24-27 HR in 90's right now- doing better  16. R hand swelling  4/23- doing ice- will do xrays 4/24- Xray doesn't look like cause of swelling, however could be due to new shunt? 17. Hypocalcemia 4/24- his Albumin  is 2.1, so probably ok, and phos coming down- will leave to renal- thank you for  their help   I spent >1mins performing patient care related activities, including prolonged face to face time, documentation time, order management, discussion of plan with patient/family and nursing staff, consult ID, and overall coordination of care.   LOS: 10 days A FACE TO FACE EVALUATION WAS PERFORMED  [address] 06/09/2023, 12:27 PM

## 2023-06-09 NOTE — Progress Notes (Signed)
 Physical Therapy Session Note  Patient Details  Name: Carl Vanderveer Sr. MRN: [id number] Date of Birth: [birth year]  Today's Date: 06/09/2023 PT Individual Time: 2130-8657 PT Individual Time Calculation (min): 58 min   Short Term Goals: Week 2:  PT Short Term Goal 1 (Week 2): Pt will progress transfers to mod assist PT Short Term Goal 2 (Week 2): Pt will tolerate standing with lift equipment x 5 min preparation for gait PT Short Term Goal 3 (Week 2): Pt will be able to perform bed mobilty with min assist for supine <> sit  Skilled Therapeutic Interventions/Progress Updates: Pt presents semi-reclined in bed and agreeable to therapy.  Pt transfers sup to sit w/ as little as supervision and rails, but then requires mod A to scoot to EOB.  Pt requires constant verbal cues for use of UES to increase buttock clearance.  Pt able to lean to R for SB placement.  Pt requires mod A and verbal cues for sequencing to transfer bed > w/c.  Pool noodle placed for improved positioning RLE, gait belt utilized for maintaining B LES.  Pt negotiated PWC out of room and into hallway, increasing speed and then > 200' w/ supervision, multiple trials, cueing to maintain straight path.  Pt performed all w/c controls for pressure relief position, finger keeps slipping off control button (did not have controller extended out enough).  Pt returned to room and longer SB retrieved by rehab tech for length needed 2/2 PWC.  Pt again required mod A for SB transfer w/c > bed, cues for L foot placement and manual A for R foot advancement.  Pt required min A and cues for sit to supine.  Bed alarm on and all needs in reach.     Therapy Documentation Precautions:  Precautions Precautions: Fall, Cervical, Other (comment) (HD MWF (RUE restricted).) Recall of Precautions/Restrictions: Impaired Precaution/Restrictions Comments: Verbally reviewed cervical precautions. Required Braces or Orthoses: Cervical Brace Cervical Brace: Hard  collar Restrictions Weight Bearing Restrictions Per Provider Order: No General:   Vital Signs: Therapy Vitals Temp: 98.9 F (37.2 C) Pulse Rate: (!) 107 Resp: 18 BP: 139/80 Patient Position (if appropriate): Lying Oxygen Therapy SpO2: 99 % O2 Device: Room Air Pain:0/10     Therapy/Group: Individual Therapy  [name] P [name] 06/09/2023, 4:00 PM

## 2023-06-10 ENCOUNTER — Other Ambulatory Visit (HOSPITAL_COMMUNITY): Payer: Self-pay

## 2023-06-10 ENCOUNTER — Inpatient Hospital Stay (HOSPITAL_COMMUNITY)

## 2023-06-10 DIAGNOSIS — S14104A Unspecified injury at C4 level of cervical spinal cord, initial encounter: Secondary | ICD-10-CM

## 2023-06-10 DIAGNOSIS — R609 Edema, unspecified: Secondary | ICD-10-CM | POA: Diagnosis not present

## 2023-06-10 LAB — RENAL FUNCTION PANEL
Albumin: 2 g/dL — ABNORMAL LOW (ref 3.5–5.0)
Anion gap: 17 — ABNORMAL HIGH (ref 5–15)
BUN: 97 mg/dL — ABNORMAL HIGH (ref 8–23)
CO2: 20 mmol/L — ABNORMAL LOW (ref 22–32)
Calcium: 9.1 mg/dL (ref 8.9–10.3)
Chloride: 90 mmol/L — ABNORMAL LOW (ref 98–111)
Creatinine, Ser: 12.41 mg/dL — ABNORMAL HIGH (ref 0.61–1.24)
GFR, Estimated: 4 mL/min — ABNORMAL LOW (ref >60)
Glucose, Bld: 133 mg/dL — ABNORMAL HIGH (ref 70–99)
Phosphorus: 5.8 mg/dL — ABNORMAL HIGH (ref 2.5–4.6)
Potassium: 6.3 mmol/L (ref 3.5–5.1)
Sodium: 127 mmol/L — ABNORMAL LOW (ref 135–145)

## 2023-06-10 LAB — GLUCOSE, CAPILLARY
Glucose-Capillary: 96 mg/dL (ref 70–99)
Glucose-Capillary: 103 mg/dL — ABNORMAL HIGH (ref 70–99)
Glucose-Capillary: 143 mg/dL — ABNORMAL HIGH (ref 70–99)

## 2023-06-10 LAB — CBC
HCT: 24.5 % — ABNORMAL LOW (ref 39.0–52.0)
Hemoglobin: 7.7 g/dL — ABNORMAL LOW (ref 13.0–17.0)
MCH: 28.4 pg (ref 26.0–34.0)
MCHC: 31.4 g/dL (ref 30.0–36.0)
MCV: 90.4 fL (ref 80.0–100.0)
Platelets: 325 10*3/uL (ref 150–400)
RBC: 2.71 MIL/uL — ABNORMAL LOW (ref 4.22–5.81)
RDW: 15.3 % (ref 11.5–15.5)
WBC: 9.3 10*3/uL (ref 4.0–10.5)
nRBC: 0 % (ref 0.0–0.2)

## 2023-06-10 LAB — HEPATITIS B SURFACE ANTIBODY, QUANTITATIVE: Hep B S AB Quant (Post): 48.6 m[IU]/mL

## 2023-06-10 LAB — AMMONIA: Ammonia: 20 umol/L (ref 9–35)

## 2023-06-10 MED ORDER — POLYETHYLENE GLYCOL 3350 17 G PO PACK: 17.0000 g | PACK | Freq: Two times a day (BID) | ORAL | Status: DC | PRN

## 2023-06-10 MED ORDER — SORBITOL 70 % SOLN
30.0000 mL | Freq: Every day | Status: DC | PRN
  Administered 2023-06-11 – 2023-06-20 (×3): 30 mL via ORAL
  Filled 2023-06-10 (×4): qty 30

## 2023-06-10 NOTE — Progress Notes (Signed)
 Occupational Therapy Note  Patient Details  Name: Carl Vandrunen Sr. MRN: [id number] Date of Birth: 04/24/1960  Today's Date: 06/10/2023 OT Individual Time:  -     and Today's Date: 06/10/2023 OT Missed Time: 75 Minutes Missed Time Reason: Unavailable (comment) (pt at dialysis)  Upon OT arrival in room, pt out of unit for dialysis. OT will attempt to make up missed minutes as able.    [name], OTD, OTR/L 06/10/2023, 3:46 PM

## 2023-06-10 NOTE — Progress Notes (Signed)
 Critical value potassium result of 6.3. 1 K potassium Dialysate bath has been ordered for one hour to lower the potassium level. Patient has been assessed. No verbalized concerns. Heart rate is regular. No chest pain reported

## 2023-06-10 NOTE — Progress Notes (Signed)
 Physical Therapy Session Note  Patient Details  Name: Carl Niven Sr. MRN: [id number] Date of Birth: [birth year]  Today's Date: 06/10/2023 PT Individual Time: 1042-1128 PT Individual Time Calculation (min): 46 min   Short Term Goals: Week 2:  PT Short Term Goal 1 (Week 2): Pt will progress transfers to mod assist PT Short Term Goal 2 (Week 2): Pt will tolerate standing with lift equipment x 5 min preparation for gait PT Short Term Goal 3 (Week 2): Pt will be able to perform bed mobilty with min assist for supine <> sit  Skilled Therapeutic Interventions/Progress Updates:    Pt recd in PWC. No report of increased pain this am. Pt navigated PWC during session with supervision. Session focused on transfer training with transfer board, UE strength for transfers, and care direction. Pt was cued to set up for transfer and directed to lead transfer as if therapist had never done it before. Needed occ min cues for safety or sequencing, but able to direct ~90% of transfer. Mod a x 2, but noted pt not fully clearing board with scooting. 2 x 5 tricep push ups on mat, but pt unable to clear. Pt then practiced scooting and able to clear with mod a. Return to chair with improved clearance but still required mod x 2.   Therapy Documentation Precautions:  Precautions Precautions: Fall, Cervical, Other (comment) (HD MWF (RUE restricted).) Recall of Precautions/Restrictions: Impaired Precaution/Restrictions Comments: Verbally reviewed cervical precautions. Required Braces or Orthoses: Cervical Brace Cervical Brace: Hard collar Restrictions Weight Bearing Restrictions Per Provider Order: No General:       Therapy/Group: Individual Therapy  Tex Filbert 06/10/2023, 12:33 PM

## 2023-06-10 NOTE — Progress Notes (Signed)
 Pt off the floor for MWF Dialysis. Report given to Corey,RN.

## 2023-06-10 NOTE — Evaluation (Signed)
 Recreational Therapy Assessment and Plan  Patient Details  Name: Carl Dewater [name]. MRN: [id number] Date of Birth: 1961/01/18 Today's Date: 06/10/2023  Rehab Potential:   ELOS:     Assessment Hospital Problem: Principal Problem:   Spinal cord injury, cervical region, sequela [facility])     Past Medical History:      Past Medical History:  Diagnosis Date   Acute gastric ulcer with hemorrhage     Acute GI bleeding 07/31/2019   Acute pericarditis     Anemia      hx of   Anxiety      situational    Arthritis      on meds   Borderline diabetes     Cataract      bilateral sx   Chronic headaches     Depression      situational    Diverticulitis     ESRD (end stage renal disease) (HCC)       On Renal Transplant List," [facility]; MWF" (10/23/2016)   GERD (gastroesophageal reflux disease)      with certain foods   GI bleed     Hypertension      diet controlled   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     Parathyroid  abnormality (HCC)      ectopic parathyroid  gland   Presence of arteriovenous fistula for hemodialysis, primary (HCC)      RUE PER PT RLE   Refusal of blood product      NO WHOLE BLOOD PROUCTS   Renal cell carcinoma (HCC)      s/p hand assisted laparoscopic bilateral nephrectomies 11/29/17, + RCC left   Secondary hyperparathyroidism (HCC)     Seizures (HCC)      one episode in past, due to" elevated Potassium" 08/02/20- "at least 4 years ago"   Sleep apnea      doesn't use CPAP anymore since weight loss   Stroke [facility])      no residual        Past Surgical History:       Past Surgical History:  Procedure Laterality Date   ANTERIOR CERVICAL CORPECTOMY N/A 05/25/2023    Procedure: ANTERIOR CERVICAL CORPECTOMY CERVICAL FOUR, CERVICAL THREE - CERVICAL FIVE FUSION;  Surgeon: [name], MD;  Location: [facility] OR;  Service: Neurosurgery;  Laterality: N/A;   AV FISTULA PLACEMENT Right      right arm   BIOPSY   08/01/2019    Procedure: BIOPSY;  Surgeon: Tami Falcon, MD;   Location: [facility] ENDOSCOPY;  Service: Endoscopy;;   BIOPSY   11/18/2020    Procedure: BIOPSY;  Surgeon: Elois Hair, MD;  Location: [facility] ENDOSCOPY;  Service: Gastroenterology;;   BIOPSY   09/13/2021    Procedure: BIOPSY;  Surgeon: Normie Becton., MD;  Location: [facility] ENDOSCOPY;  Service: Gastroenterology;;   CATARACT EXTRACTION W/ INTRAOCULAR LENS  IMPLANT, BILATERAL       COLON SURGERY       COLONOSCOPY N/A 08/04/2015    Procedure: COLONOSCOPY;  Surgeon: Nannette Babe, MD;  Location: [facility] ENDOSCOPY;  Service: Endoscopy;  Laterality: N/A;   COLONOSCOPY   2017    JMP@ [facility]-good prep-mass -recall 1 yr   COLONOSCOPY N/A 09/13/2021    Procedure: COLONOSCOPY;  Surgeon: Mansouraty, Albino Alu., MD;  Location: [facility] ENDOSCOPY;  Service: Gastroenterology;  Laterality: N/A;   COLONOSCOPY WITH PROPOFOL  N/A 11/25/2020    Procedure: COLONOSCOPY WITH PROPOFOL ;  Surgeon: Daina Drum, MD;  Location: [facility] ENDOSCOPY;  Service: Gastroenterology;  Laterality:  N/A;   COLONOSCOPY WITH PROPOFOL  N/A 09/23/2021    Procedure: COLONOSCOPY WITH PROPOFOL ;  Surgeon: Annis Kinder, DO;  Location: [facility] ENDOSCOPY;  Service: Gastroenterology;  Laterality: N/A;   ENTEROSCOPY N/A 09/23/2021    Procedure: ENTEROSCOPY;  Surgeon: Annis Kinder, DO;  Location: [facility] ENDOSCOPY;  Service: Gastroenterology;  Laterality: N/A;  Will place order for video capsule study as we may opt to place it during procedure   ESOPHAGOGASTRODUODENOSCOPY N/A 08/01/2019    Procedure: ESOPHAGOGASTRODUODENOSCOPY (EGD);  Surgeon: Tami Falcon, MD;  Location: [facility] ENDOSCOPY;  Service: Endoscopy;  Laterality: N/A;   ESOPHAGOGASTRODUODENOSCOPY N/A 11/18/2020    Procedure: ESOPHAGOGASTRODUODENOSCOPY (EGD);  Surgeon: Elois Hair, MD;  Location: [facility] ENDOSCOPY;  Service: Gastroenterology;  Laterality: N/A;   ESOPHAGOGASTRODUODENOSCOPY (EGD) WITH PROPOFOL  N/A 08/04/2019    Procedure: ESOPHAGOGASTRODUODENOSCOPY (EGD) WITH PROPOFOL ;  Surgeon: Ace Holder, MD;  Location: [name] ENDOSCOPY;  Service: Gastroenterology;  Laterality: N/A;   ESOPHAGOGASTRODUODENOSCOPY (EGD) WITH PROPOFOL  N/A 09/13/2021    Procedure: ESOPHAGOGASTRODUODENOSCOPY (EGD) WITH PROPOFOL ;  Surgeon: Brice Campi Albino Alu., MD;  Location: [facility] ENDOSCOPY;  Service: Gastroenterology;  Laterality: N/A;   GIVENS CAPSULE STUDY N/A 09/23/2021    Procedure: GIVENS CAPSULE STUDY;  Surgeon: Annis Kinder, DO;  Location: [facility] ENDOSCOPY;  Service: Gastroenterology;  Laterality: N/A;   graft left arm Left      for dialysis x 2. Removed   HOT HEMOSTASIS   11/18/2020    Procedure: HOT HEMOSTASIS (ARGON PLASMA COAGULATION/BICAP);  Surgeon: Elois Hair, MD;  Location: [facility] ENDOSCOPY;  Service: Gastroenterology;;   HOT HEMOSTASIS N/A 09/23/2021    Procedure: HOT HEMOSTASIS (ARGON PLASMA COAGULATION/BICAP);  Surgeon: Annis Kinder, DO;  Location: [facility] ENDOSCOPY;  Service: Gastroenterology;  Laterality: N/A;   INSERTION OF DIALYSIS CATHETER        Rt chest   LAPAROSCOPIC RIGHT COLECTOMY N/A 08/05/2015    Procedure: LAPAROSCOPIC RIGHT COLECTOMY- ASCENDING;  Surgeon: Lockie Rima, MD;  Location: [facility] OR;  Service: General;  Laterality: N/A;   MASS EXCISION Left 05/28/2019    Procedure: EXCISION SOFT TISSUE MASS LEFT SHOULDER;  Surgeon: Oralee Billow, MD;  Location: [facility] ORS;  Service: General;  Laterality: Left;   NEPHRECTOMY Bilateral     PARATHYROIDECTOMY N/A 06/12/2016    Procedure: TOTAL PARATHYROIDECTOMY WITH AUTOTRANSPLANTATION TO LEFT FOREARM;  Surgeon: Oralee Billow, MD;  Location: [facility] OR;  Service: General;  Laterality: N/A;   PARATHYROIDECTOMY N/A 10/23/2016    Procedure: PARATHYROIDECTOMY;  Surgeon: Oralee Billow, MD;  Location: [facility] OR;  Service: General;  Laterality: N/A;   REVERSE SHOULDER ARTHROPLASTY Right 08/24/2020    Procedure: REVERSE SHOULDER ARTHROPLASTY;  Surgeon: Micheline Ahr, MD;  Location: [facility] OR;  Service: Orthopedics;  Laterality: Right;   REVISON OF ARTERIOVENOUS  FISTULA Right 07/16/2017    Procedure: REVISION OF ARTERIOVENOUS FISTULA  Right ARM;  Surgeon: Adine Hoof, MD;  Location: [facility] OR;  Service: Vascular;  Laterality: Right;   TOTAL HIP ARTHROPLASTY Left 11/14/2020    Procedure: LEFT TOTAL HIP ARTHROPLASTY ANTERIOR APPROACH;  Surgeon: Wes Hamman, MD;  Location: [facility] OR;  Service: Orthopedics;  Laterality: Left;   UPPER GASTROINTESTINAL ENDOSCOPY   2021    @ [facility]          Assessment & Plan Clinical Impression: [name] is a 63 year old male (Jehovah's witness but has accepted blood products in the past)  with history of ESRD-HD MWF, duodenitis/gastritis w/recurrent GIB, GERD, seizure, HTN, OSA, RCC s/p B-lap nephrectomies, cerebral venous  stenosis, CVA w/o residual deficits, chronic HA who has had progressive numbness with pain BUE, right foot drop X 2 weeks and balance issues who fell due to knees giving away and struck his face. He was unable to lift his legs and had weakness of BUE and presented to ED for evaluation on 05/25/23. MRI brain showed chronic L-PCA stroke iwht large left mastoid and middle ear effusion. CT C spine showed subacute C4  burst fracture with severe canal stenosis superimposed on  C4/5 & C5-6  advanced spondylosis with cord edema and/or evolving myelomalacia C3-C6. He was evaluated by Dr. Adonis Alamin, found to have tetraplegia BUE>BLE and was taken to OR emergently  for C4 anterior corpectomy with C3-C5 anterior fusion on 05/26/23 am. Post procedure HD ongoing and home gabapentin  resumed. PT/OT  has been working with patient who is showing improvement in balance at EOB with close guard assist for safety and posterior lean. He requires +2 assist with Stedy for mobility and ADLs.    He was independent and driving PTA but having difficulty with RLE and RUE so was planning on stopping. CIR recommended due to functional decline.   Patient transferred to CIR on 05/30/2023 .    Pt presents with decreased activity  tolerance, decreased functional mobility, decreased balance, decreased coordination, difficulty maintaining precautions, feelings of stress Limiting pt's independence with leisure/community pursuits.  Met with pt today to discuss TR services including importance of social, emotional, spiritual health in addition to physical health and their effects on overall health and wellness.  Pt stated understanding.   Plan  Min 1 TR session >20 minutes per week during LOS  Recommendations for other services: None   Discharge Criteria: Patient will be discharged from TR if patient refuses treatment 3 consecutive times without medical reason.  If treatment goals not met, if there is a change in medical status, if patient makes no progress towards goals or if patient is discharged from hospital.  The above assessment, treatment plan, treatment alternatives and goals were discussed and mutually agreed upon: by patient  Kenecia Barren 06/10/2023, 12:35 PM

## 2023-06-10 NOTE — Progress Notes (Signed)
 Subjective: Sitting up in his motorized wheelchair, no current complaints/tolerating dialysis   Objective Vital signs in last 24 hours: Vitals:   06/09/23 1600 06/09/23 1914 06/09/23 2030 06/10/23 0445  BP:  134/77  118/68  Pulse: (!) 106 (!) 104  94  Resp:  18  17  Temp:  100.3 F (37.9 C) 99 F (37.2 C) 98.9 F (37.2 C)  TempSrc:  Oral Oral Oral  SpO2:  98%  99%  Weight:      Height:       Weight change:   Physical Exam: General: Alert pleasant male in C-spine collar NAD Heart: RRR no MRG Lungs: CTA bilaterally nonlabored breathing Abdomen: NABS soft NT ND Extremities: No pedal edema Dialysis Access: R FA AVF positive bruit    OP Dialysis Orders [facility] - MWF 4h  B400   85.5kg   2K bath  AVF   - Heparin  3000 units IV three times per week  - Mircera 60mcg IV q 2 weeks- last dose 05/24/23  - Calcitriol  1.52mcg PO q HD  Problem/Plan:  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 Now in CIR ESRD: Off schedule last week Resume MWF schedule today.  HTN/volume: BP controlled/stable, Still very much above OP EDW.  No standing weights/ UF as tolerated. Anemia of ESRD: Hgb 8.5- improving, continue Aranesp  q Tues while here-dose increased. Secondary HPTH: CorrCa/Phos ok - continue sevelamer , calcitriol , sensipar  for now. Nutrition: Alb low, continue protein supps.  Charlynne Coombes, PA-C [facility] Beeper [phone] 06/10/2023,11:10 AM  LOS: 11 days   Labs: Basic Metabolic Panel: No results for input(s): "NA", "K", "CL", "CO2", "GLUCOSE", "BUN", "CREATININE", "CALCIUM ", "PHOS" in the last 168 hours.  Invalid input(s): "ALB" Liver Function Tests: No results for input(s): "AST", "ALT", "ALKPHOS", "BILITOT", "PROT", "ALBUMIN " in the last 168 hours. No results for input(s): "LIPASE", "AMYLASE" in the last 168 hours. No results for input(s): "AMMONIA" in the last 168 hours. CBC: Recent Labs  Lab 06/04/23 0507 06/08/23 1248  WBC 7.3  8.9  NEUTROABS  --  6.2  HGB 8.7* 8.5*  HCT 27.8* 26.2*  MCV 90.0 88.8  PLT 307 301   Cardiac Enzymes: No results for input(s): "CKTOTAL", "CKMB", "CKMBINDEX", "TROPONINI" in the last 168 hours. CBG: Recent Labs  Lab 06/09/23 0609 06/09/23 1103 06/09/23 1632 06/09/23 2057 06/10/23 0607  GLUCAP 110* 140* 116* 126* 96    Studies/Results: CT HEAD WO CONTRAST ( ) Result Date: 06/09/2023 CLINICAL DATA:  63 year old male with altered mental status. EXAM: CT HEAD WITHOUT CONTRAST TECHNIQUE: Contiguous axial images were obtained from the base of the skull through the vertex without intravenous contrast. RADIATION DOSE REDUCTION: This exam was performed according to the departmental dose-optimization program which includes automated exposure control, adjustment of the mA and/or kV according to patient size and/or use of iterative reconstruction technique. COMPARISON:  Brain MRI and head CT 05/25/2023. FINDINGS: Brain: Unusual extensive, diffuse pachymeningeal calcification which has been present since at least 2013. Stable cerebral volume. No midline shift, ventriculomegaly, mass effect, evidence of mass lesion, intracranial hemorrhage or evidence of cortically based acute infarction. Small area of chronic encephalomalacia in the medial left occipital lobe redemonstrated. Stable gray-white matter differentiation throughout the brain. Vascular: Chronic severe pachymeningeal calcification. Skull: Appears stable.  No acute osseous abnormality identified. Sinuses/Orbits: Continued left middle ear and mastoid opacification. Other Visualized paranasal sinuses and mastoids are stable and well aerated. Other: Anterior left scalp soft tissue injury has regressed since earlier in the  month. Orbits soft tissues appears stable. IMPRESSION: 1. No acute intracranial abnormality. 2. 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 3. Ongoing left middle ear  and mastoid opacification. No acute osseous abnormality identified. 4. Regressed scalp soft tissue injury since 05/25/2023. Electronically Signed   By: Marlise Simpers M.D.   On: 06/09/2023 17:49   Medications:   (feeding supplement) PROSource Plus  30 mL Oral BID BM   atorvastatin   10 mg Oral Q Thu   calcitRIOL   1.5 mcg Oral Q M,W,F-HD   carbamide peroxide  5 drop Right EAR BID   Chlorhexidine  Gluconate Cloth  6 each Topical Q0600   cinacalcet   120 mg Oral Q supper   [START ON 06/11/2023] darbepoetin (ARANESP ) injection - DIALYSIS  150 mcg Subcutaneous Q Tue-1800   gabapentin   300 mg Oral QHS   Gerhardt's butt cream   Topical BID   midodrine   10 mg Oral Once in dialysis   pantoprazole   40 mg Oral BID AC   psyllium  1 packet Oral TID   senna-docusate  2 tablet Oral BID   sevelamer  carbonate  3,200 mg Oral TID WC   topiramate   25 mg Oral BID

## 2023-06-10 NOTE — Progress Notes (Signed)
 Lower extremity venous duplex completed. Please see CV Procedures for preliminary results.  Estanislao Heimlich, RVT 06/10/23 11:54 AM

## 2023-06-10 NOTE — Progress Notes (Signed)
 PROGRESS NOTE   Subjective/Complaints:  Pt reports is doing "fine" denies any cognitive issues- nursing reports nothing overnight-  Last had HD Saturday.  Working on feeding self.  LBM overnight.  R knee hurting some, but not as bad.  Likes metamucil- doesn't want miralax - refusing every time- says makes him have accidents.     ROS:    Pt denies SOB, abd pain, CP, N/V/C/D, and vision changes  Still having R hand swelling  Objective:   VAS US  LOWER EXTREMITY VENOUS (DVT) Result Date: 06/10/2023  Lower Venous DVT Study Patient Name:  Mardochee Reichling Sr.  Date of Exam:   06/10/2023 Medical Rec #: [id number]            Accession #:    [id number] Date of Birth: 07-May-1960             Patient Gender: M Patient Age:   61 years Exam Location:  [facility] Procedure:      VAS US  LOWER EXTREMITY VENOUS (DVT) Referring Phys: Janeece Mechanic --------------------------------------------------------------------------------  Indications: Edema.  Risk Factors: Surgery Cervical corpectomy 05/26/23. Comparison Study: No significant changes seen since previous exam 07/04/06. Performing Technologist: Estanislao Heimlich  Examination Guidelines: A complete evaluation includes B-mode imaging, spectral Doppler, color Doppler, and power Doppler as needed of all accessible portions of each vessel. Bilateral testing is considered an integral part of a complete examination. Limited examinations for reoccurring indications may be performed as noted. The reflux portion of the exam is performed with the patient in reverse Trendelenburg.  +---------+---------------+---------+-----------+----------+--------------+ RIGHT    CompressibilityPhasicitySpontaneityPropertiesThrombus Aging +---------+---------------+---------+-----------+----------+--------------+ CFV      Full           Yes      Yes                                  +---------+---------------+---------+-----------+----------+--------------+ SFJ      Full                                                        +---------+---------------+---------+-----------+----------+--------------+ FV Prox  Full                                                        +---------+---------------+---------+-----------+----------+--------------+ FV Mid   Full                                                        +---------+---------------+---------+-----------+----------+--------------+ FV DistalFull                                                        +---------+---------------+---------+-----------+----------+--------------+  PFV      Full                                                        +---------+---------------+---------+-----------+----------+--------------+ POP      Full           Yes      Yes                                 +---------+---------------+---------+-----------+----------+--------------+ PTV      Full                    Yes                                 +---------+---------------+---------+-----------+----------+--------------+ PERO     Full                    Yes                                 +---------+---------------+---------+-----------+----------+--------------+   +---------+---------------+---------+-----------+----------+--------------+ LEFT     CompressibilityPhasicitySpontaneityPropertiesThrombus Aging +---------+---------------+---------+-----------+----------+--------------+ CFV      Full           Yes      Yes                                 +---------+---------------+---------+-----------+----------+--------------+ SFJ      Full                                                        +---------+---------------+---------+-----------+----------+--------------+ FV Prox  Full                                                         +---------+---------------+---------+-----------+----------+--------------+ FV Mid   Full                                                        +---------+---------------+---------+-----------+----------+--------------+ FV DistalFull                                                        +---------+---------------+---------+-----------+----------+--------------+ PFV      Full                                                        +---------+---------------+---------+-----------+----------+--------------+  POP      Full           Yes      Yes                                 +---------+---------------+---------+-----------+----------+--------------+ PTV      Full                    Yes                                 +---------+---------------+---------+-----------+----------+--------------+ PERO     Full                    Yes                                 +---------+---------------+---------+-----------+----------+--------------+     Summary: BILATERAL: - No evidence of deep vein thrombosis seen in the lower extremities, bilaterally. -No evidence of popliteal cyst, bilaterally.   *See table(s) above for measurements and observations. Electronically signed by Irvin Mantel on 06/10/2023 at 2:19:27 PM.    Final    CT HEAD WO CONTRAST ( ) Result Date: 06/09/2023 CLINICAL DATA:  63 year old male with altered mental status. EXAM: CT HEAD WITHOUT CONTRAST TECHNIQUE: Contiguous axial images were obtained from the base of the skull through the vertex without intravenous contrast. RADIATION DOSE REDUCTION: This exam was performed according to the departmental dose-optimization program which includes automated exposure control, adjustment of the mA and/or kV according to patient size and/or use of iterative reconstruction technique. COMPARISON:  Brain MRI and head CT 05/25/2023. FINDINGS: Brain: Unusual extensive, diffuse pachymeningeal calcification which has been present since  at least 2013. Stable cerebral volume. No midline shift, ventriculomegaly, mass effect, evidence of mass lesion, intracranial hemorrhage or evidence of cortically based acute infarction. Small area of chronic encephalomalacia in the medial left occipital lobe redemonstrated. Stable gray-white matter differentiation throughout the brain. Vascular: Chronic severe pachymeningeal calcification. Skull: Appears stable.  No acute osseous abnormality identified. Sinuses/Orbits: Continued left middle ear and mastoid opacification. Other Visualized paranasal sinuses and mastoids are stable and well aerated. Other: Anterior left scalp soft tissue injury has regressed since earlier in the month. Orbits soft tissues appears stable. IMPRESSION: 1. No acute intracranial abnormality. 2. 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 3. Ongoing left middle ear and mastoid opacification. No acute osseous abnormality identified. 4. Regressed scalp soft tissue injury since 05/25/2023. Electronically Signed   By: Marlise Simpers M.D.   On: 06/09/2023 17:49      Recent Labs    06/08/23 1248 06/10/23 1416  WBC 8.9 9.3  HGB 8.5* 7.7*  HCT 26.2* 24.5*  PLT 301 325    Recent Labs    06/10/23 1416  NA 127*  K 6.3*  CL 90*  CO2 20*  GLUCOSE 133*  BUN 97*  CREATININE 12.41*  CALCIUM  9.1     Intake/Output Summary (Last 24 hours) at 06/10/2023 1929 Last data filed at 06/10/2023 1831 Gross per 24 hour  Intake 360 ml  Output 2.7 ml  Net 357.3 ml        Physical Exam: Vital Signs Blood pressure 138/87, pulse (!) 106, temperature 98.4 F (36.9 C), temperature source Oral, resp. rate 16, height 6'  1" (1.854 m), weight 93.3 kg, SpO2 97%.      General: awake, alert, appropriate, somewhat supine in bed; a little frustrated about wanting to eat; LPN in room;  NAD HENT: conjugate gaze; oropharynx moist CV: regular rhythm, tachycardic rate; no JVD Pulmonary:  CTA B/L; no W/R/R- good air movement GI: soft, NT, ND, (+)BS- normoactive Psychiatric: appropriate- got frustrated about food and recounting conversation with someone yesterday Neurological: Ox3- no sign of confusion with me   MsK: B/L knees with effusions, no erythema, not really focally warm to touch- look good today Skin: no evidence of cellulitis to extremities.   PRIOR EXAMS: Neurological: Ox3  MSK- R hand swelling esp ulnar side of hand/dorsum and L wrist-stable this AM RUE- Biceps 4/5; WE 4-/5; Triceps 2/5; Grip 2-/5 and FA 1/5 LUE- 4/5 except triceps 3+ to 4-/5 RLE- HF 1 to 2-/5; KE 2-/5; DF/PF 2-/5 LLE- HF 3/5; KE 4-/5; DF/PF 4/5    Skin: C/D/I. Multiple lesions on BL legs with various stages of healing.  + RUE fistula - intact   MSK:      LLE knee with palpable effusion, no warmth, no ttp Mood: Pleasant affect, appropriate mood.  Neurologic exam: Alert and oriented x 4, fluent, follows commands, cranial nerves II through XII grossly intact, no hypertonia noted  Sensation: Reduced to light touch in distal right lower extremity to the mid-thigh       Strength:                RUE: 2/5 SA, 2/5 EF, 2/5 EE, 2/5 WE, 2/5 FF, 2/5 FA                LUE:  3/5 SA, 3/5 EF, 3/5 EE, 3/5 WE, 3/5 FF, 3/5 FA                RLE: 2/5 HF, 2/5 KE, 2/5  DF, 2/5  EHL, 2/5  PF                 LLE:  3/5 HF, 4/5 KE, 4/5  DF, 4/5  EHL, 4/5  PF  Neuroexam stable 4/21  Assessment/Plan: 1. Functional deficits which require 3+ hours per day of interdisciplinary therapy in a comprehensive inpatient rehab setting. Physiatrist is providing close team supervision and 24 hour management of active medical problems listed below. Physiatrist and rehab team continue to assess barriers to discharge/monitor patient progress toward functional and medical goals  Care Tool:  Bathing    Body parts bathed by patient: Right arm, Chest, Abdomen, Right upper leg, Left upper leg, Face   Body parts bathed by  helper: Left arm, Front perineal area, Buttocks, Right lower leg, Left lower leg     Bathing assist Assist Level: Moderate Assistance - Patient 50 - 74%     Upper Body Dressing/Undressing Upper body dressing   What is the patient wearing?: Pull over shirt    Upper body assist Assist Level: Moderate Assistance - Patient 50 - 74%    Lower Body Dressing/Undressing Lower body dressing      What is the patient wearing?: Pants     Lower body assist Assist for lower body dressing: Maximal Assistance - Patient 25 - 49%     Toileting Toileting Toileting Activity did not occur (Clothing management and hygiene only): N/A (no void or bm) (anuric, HD patient)  Toileting assist Assist for toileting: Maximal Assistance - Patient 25 - 49%     Transfers Chair/bed transfer  Transfers assist  Chair/bed transfer assist level: Moderate Assistance - Patient 50 - 74% (slideboard)     Locomotion Ambulation   Ambulation assist   Ambulation activity did not occur: Safety/medical concerns          Walk 10 feet activity   Assist  Walk 10 feet activity did not occur: Safety/medical concerns        Walk 50 feet activity   Assist Walk 50 feet with 2 turns activity did not occur: Safety/medical concerns         Walk 150 feet activity   Assist Walk 150 feet activity did not occur: Safety/medical concerns         Walk 10 feet on uneven surface  activity   Assist Walk 10 feet on uneven surfaces activity did not occur: Safety/medical concerns         Wheelchair     Assist Is the patient using a wheelchair?: Yes Type of Wheelchair: Power    Wheelchair assist level: Supervision/Verbal cueing Max wheelchair distance: 200+    Wheelchair 50 feet with 2 turns activity    Assist        Assist Level: Supervision/Verbal cueing   Wheelchair 150 feet activity     Assist      Assist Level: Supervision/Verbal cueing   Blood pressure 138/87,  pulse (!) 106, temperature 98.4 F (36.9 C), temperature source Oral, resp. rate 16, height 6\' 1"  (1.854 m), weight 93.3 kg, SpO2 97%.  Medical Problem List and Plan: 1. 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  2.  Antithrombotics: -DVT/anticoagulation:  Mechanical: Sequential compression devices, below knee Bilateral lower extremities             -antiplatelet therapy: N/A--no antiplatelet due to hx of recurrent GIB  4/25- d/w family- they agree that Lovenox  not appropriate at this time.   -06/09/23 ordered dopplers to r/o clots given low grade temps recently  4/28- LE Dopplers (-) for DVT 3. Pain Management:  oxycodone  prn. Gabapentin  300 mg/hs.              --has Flexeril  10 mg TID prn              - patient with intermittent, severe b/l headache - add topomax 25 mg BID    4/19- pt rating pain 07/19/08 which is "tolerable"  4/22- pain slightly better this AM  4/25- fewer complaints of pain -06/09/23 having more b/l knee pain and using percocets more-- could be causing confusion? Advised judicious use, might benefit from knee aspirations/injections? 4/28- less knee pain this AM 4. Mood/Behavior/Sleep: LCSW to follow for evaluation and support             -antipsychotic agents: N/A             --  melatonin prn for insomnia.  -06/09/23 poor sleep, d/t interruptions, asked nursing to minimize  interruptions between 11p-5a (apparently they can't put a sign up, but will communicate in huddle) 5. Neuropsych/cognition: This patient is capable of making decisions on his own behalf. -06/09/23 son concerned with confusion, could be from poor sleep/pain meds vs cefepime  toxicity; getting head CT, dopplers, consulted ID Dr. Levern Reader as below 4/28- cognitively intact this AM 6. Skin/Wound Care: Routine pressure relief measures.   4/19- due to ulcers on legs, on low air loss mattress 7. Fluids/Electrolytes/Nutrition: Strict I/O. Daily weights. Labs with HD. 8. ESRD- HD MWF: Schedule HD at the end of the day to help with tolerance of therapy             --Sensipar  and Renvela  for metabolic bone disease.   - Patient reports no longer makes urine  4/20- pt getting HD this AM- last HD was Wednesday.   4/21 dialysis moved to tomorrow  4/22- HD to be done today  4/25- Next HD today to get back on track  4/28- HD today 9. Anemia of chronic disease: Monitor H/H, aranesp   -4/21 HGB lower 7.5, FOBT, recheck tomorrow  4/22- HB 8.7 on today's check-con't to monitor  -06/09/23 hgb 8.5 yesterday  4/28- Hb down to 7.7-  10. H/o recurrent GIB/Jehovah's Witness: Continue Protonix  BID 11. H/o Hypotension: Has had drop  in BP with HD. Now cervical SCI.  --Monitor for symptoms day after HD and with activity. -4/18 baseline blood pressure stable, monitor with activity 4/19- BP actually slightly elevated today- 140s-150's- con't regimen, since can drop with HD and therapy 4/20- BP actually doing better today- in 100s'110s- however HR elevated, likely to fluid overload 4/21 nephrology started midodrine  10mg  before dialysis, BP appears to be doing a little better today 4/22- less hypotensive this AM laying- will monitor for [facility] 4/23- Tachycardia slightly better- in high 90's- having some intermittent elevated BP's- will monitor 4/24- BP running 140-s150s, but don't want to drop it since having HD 4/25- Having  Hypotension with therapy, so don't want to reduce BP at all- doing TED's and ACE wraps- occ elevated, but with HD and therapy, not appropriate to reduce levels -06/08/23 BPs a little soft but HD yesterday; cont TEDs/ACE -06/09/23 BPs good, monitor  Vitals:   06/10/23 0445 06/10/23 1353 06/10/23 1406 06/10/23 1431  BP: 118/68 (!) 156/86 (!) 162/85 (!) 145/81   06/10/23 1502 06/10/23 1530 06/10/23 1600 06/10/23 1637  BP: 139/87 (!) 145/80 128/75 125/80   06/10/23 1700 06/10/23 1813 06/10/23 1816 06/10/23 1920  BP: 125/73 138/82 123/75 138/87     12. Constipation: Small BM on 04/14 --had of nulytey yesterday and another today? -Xray 4/17 without excessive stool, patient reports stool still little hard yesterday.  Will increase MiraLAX  to 3 times a day, continue Senokot twice a day. Suppository PRN   06/09/23 LBM yesterday, cont regimen  4/28- LBM overnight- has been refusing miralax - made it prn- taking metamucil 13. ?left knee effusion. Not currently bothersome. Consider imaging.  14.  SIRS- Fever/hypotension/tachycardia - 4/21 Currently afebrile, he got to 50 cc IVF yesterday, CXR negative, EKG sinus tachycardia. Patient was started on Maxipime  and vancomycin  yesterday.  Appears to be doing much better today.  WBC not elevated at 7.1.  Continue to follow blood cultures 4/22- Blood Cx's (-) so far- WBC 7.3- will con't IV ABX until know final results of blood Cx's- CXR (-)- but temp was 103.2 yesterday overnight.  4/23- Blood  C'x (-) x2 days so far- T 99.9 this Am after a few sips of coffee- was 100.4 this AM- will con't IV ABX since concerned about possible bacteremia- esp since having low grade temps still 4/24- No more fevers, even low grade- no growth on blood Cx' bu it says that didn't get enough blood, so much be falsely negative 4/25- No growth for 5 days, but it also said not enough blood to be sure was a true negative. Will change stop date til tomorrow -06/08/23 at 3am temp  recorded as 100.3 but 5am was 98.3; will get CBC w/diff, monitor temps closely today, if continued low grade temps, will reach out to ID. BCx NGTD x5d. Didn't get vanc yesterday? Or cefepime ? Unclear why. Cefepime  started this morning at 0011. -06/09/23 temp 100.3 at 3am yesterday and then 100 later in the day, but no fevers since, no tachycardia.  -WBC yesterday 8.9.  -Now with some confusion; spoke with Dr. Levern Reader of ID, stated cefepime  could be the cause of the confusion -recommended we stop all abx (which ended yesterday anyway) -watch the patient OFF abx -if fevers >100.3 occur then she recommended repeat BCx and CXR and could call her back to see if any abx would be advised at that time.  -Head CT and dopplers ordered as well, as above 4/28- CT looks good- no fever, WBC is OK- off ABX 15. Tachycardia 4/22- doing better- right at 100 this AM- will monitor- likely can be from dregs of infection vs OH. -will monitor  4/23- Tachycardia improving- 4/24-27 HR in 90's right now- doing better  16. R hand swelling  4/23- doing ice- will do xrays 4/24- Xray doesn't look like cause of swelling, however could be due to new shunt? 17. Hypocalcemia 4/24- his Albumin  is 2.1, so probably ok, and phos coming down- will leave to renal- thank you for their help  18. Hyperkalemia 4/28- K+ 6.3 today- got hypoosmotic bath to reduce K+ per note from RN/HD nurse? Also has Na of 127- being addressed by HD/renal- Phos also 5.8- but again, getting HD today  I spent a total of 38   minutes on total care today- >50% coordination of care- due to  D/w nursing about issues including Dopplers as well as family wanting to see surgeon- will call tomorrow- also reviewing all notes from today    LOS: 11 days A FACE TO FACE EVALUATION WAS PERFORMED  Cattaleya Wien 06/10/2023, 7:29 PM

## 2023-06-10 NOTE — Progress Notes (Signed)
 Physical Therapy Session Note  Patient Details  Name: Carl Hardiman Sr. MRN: [id number] Date of Birth: [birth year]  Today's Date: 06/10/2023 PT Individual Time: 0805-0916 PT Individual Time Calculation (min): 71 min   Short Term Goals: Week 2:  PT Short Term Goal 1 (Week 2): Pt will progress transfers to mod assist PT Short Term Goal 2 (Week 2): Pt will tolerate standing with lift equipment x 5 min preparation for gait PT Short Term Goal 3 (Week 2): Pt will be able to perform bed mobilty with min assist for supine <> sit  Skilled Therapeutic Interventions/Progress Updates:     Pt received semi reclined in bed and agrees to therapy. Reports pain in Lt knee. PT provides rest breaks, repositioning, and gentle mobility to manage pain. Pt performs supine to sit with modA and use of bed features, with cues for body mechanics and sequencing, and PT manually assisting each leg. PT provides cues for posture and positioning at edge of bed to prepare for transfer. Pt performs slideboard transfer to the Lt. PT provides cues for Rt lateral alean and PT places board under hip, then pt completes remainder of transfer with modA and cues for head hips relationship, body mechanics, sequencing, and initiation. Pt drives WC to gym with verbal cues for navigation and safe WC management. Pt completes activity in parallel bars to work on functional strengthening and standing balance. Pt performs multiple reps of sit to stand in parallel bars with maxA at hips and cues for anterior trunk lean and hip extension, as well as trunk extension and engagement of quads and glutes for stability. PT utilizes red theraband around distal thighs to assist with controlling RLE external rotation and keep hips and knees in neutral alignment, and also blocks both knees during transfer training. Pt requires extended seated rest breaks between bouts of standing. Pt left seated in WC with alarm intact and all needs within reach.   Therapy  Documentation Precautions:  Precautions Precautions: Fall, Cervical, Other (comment) (HD MWF (RUE restricted).) Recall of Precautions/Restrictions: Impaired Precaution/Restrictions Comments: Verbally reviewed cervical precautions. Required Braces or Orthoses: Cervical Brace Cervical Brace: Hard collar Restrictions Weight Bearing Restrictions Per Provider Order: No    Therapy/Group: Individual Therapy  Neva Barban, PT, DPT 06/10/2023, 4:04 PM

## 2023-06-11 ENCOUNTER — Inpatient Hospital Stay (HOSPITAL_COMMUNITY): Admission: RE | Admit: 2023-06-11 | Source: Home / Self Care | Admitting: Orthopedic Surgery

## 2023-06-11 ENCOUNTER — Encounter (HOSPITAL_COMMUNITY): Admission: RE | Payer: Self-pay | Source: Home / Self Care

## 2023-06-11 ENCOUNTER — Inpatient Hospital Stay (HOSPITAL_COMMUNITY)

## 2023-06-11 DIAGNOSIS — R41840 Attention and concentration deficit: Secondary | ICD-10-CM

## 2023-06-11 DIAGNOSIS — Z01818 Encounter for other preprocedural examination: Secondary | ICD-10-CM

## 2023-06-11 LAB — GLUCOSE, CAPILLARY
Glucose-Capillary: 91 mg/dL (ref 70–99)
Glucose-Capillary: 107 mg/dL — ABNORMAL HIGH (ref 70–99)
Glucose-Capillary: 120 mg/dL — ABNORMAL HIGH (ref 70–99)

## 2023-06-11 SURGERY — REVISION, REVERSE TOTAL ARTHROPLASTY, SHOULDER
Anesthesia: General | Site: Shoulder | Laterality: Right

## 2023-06-11 MED ORDER — CHLORHEXIDINE GLUCONATE CLOTH 2 % EX PADS
6.0000 | MEDICATED_PAD | Freq: Every day | CUTANEOUS | Status: DC
Start: 2023-06-11 — End: 2023-06-20
  Administered 2023-06-11 – 2023-06-19 (×6): 6 via TOPICAL

## 2023-06-11 MED ORDER — PREDNISONE 20 MG PO TABS
40.0000 mg | ORAL_TABLET | Freq: Every day | ORAL | Status: AC
  Administered 2023-06-11 – 2023-06-13 (×3): 40 mg via ORAL
  Filled 2023-06-11 (×3): qty 2

## 2023-06-11 NOTE — Progress Notes (Signed)
 Occupational Therapy Session Note  Patient Details  Name: Carl Gadbois Sr. MRN: [id number] Date of Birth: [birth year]  Today's Date: 06/11/2023 OT Individual Time: 0820-0930 OT Individual Time Calculation (min): 70 min    Short Term Goals: Week 2:  OT Short Term Goal 1 (Week 2): Pt will complete bed mobility at Mod A in preparation for LB dressing at bed level OT Short Term Goal 2 (Week 2): Pt will complete self feeding at Min A with LUE with AE as necessary OT Short Term Goal 3 (Week 2): Pt will complete UB dressing at Mod A  Skilled Therapeutic Interventions/Progress Updates:    Pt resting in PWC upon arrival, grimacing (see pain below.) OT intervention with focus on PWC mobility, BLE stretching, BUE PROM/AROM/AAROM, BUE therex, and discharge planning to increase independence with BADLs and prepare for d/c home. RLE stretching for pain mgmt with relief noted. PWC mobility throughout unit with supervision. W/c head rest adjustments made. LLE lateral support located and placed on PWC. Educated pt on placement of plate guard to facilitate self feeding. BUE shoulder PROM/AAROM for increased ROM/strength. Pt returned to room and remained in [facility] with all needs within reach. PWC seat belt secured.   Therapy Documentation Precautions:  Precautions Precautions: Fall, Cervical, Other (comment) (HD MWF (RUE restricted).) Recall of Precautions/Restrictions: Impaired Precaution/Restrictions Comments: Verbally reviewed cervical precautions. Required Braces or Orthoses: Cervical Brace Cervical Brace: Hard collar Restrictions Weight Bearing Restrictions Per Provider Order: No   Pain:  Pt c/o Rt knee pain (unrated but pt grimacing when OTA entered room; stretching with relief noted   Therapy/Group: Individual Therapy  [name] 06/11/2023, 9:31 AM

## 2023-06-11 NOTE — Progress Notes (Signed)
 Occupational Therapy Session Note  Patient Details  Name: Carl Lamarque Sr. MRN: [id number] Date of Birth: Jan 28, 1961  Today's Date: 06/11/2023 OT Individual Time: 1400-1500 OT Individual Time Calculation (min): 60 min    Short Term Goals: Week 1:  OT Short Term Goal 1 (Week 1): Pt will perform toilet transfer with consistent Mod A (x1) + LRAD. OT Short Term Goal 1 - Progress (Week 1): Not met OT Short Term Goal 2 (Week 1): Pt will thread BLE into LB garments with Min A + LRAD. OT Short Term Goal 2 - Progress (Week 1): Not met OT Short Term Goal 3 (Week 1): Pt will bathe UB with Min A + LRAD. OT Short Term Goal 3 - Progress (Week 1): Not met  Skilled Therapeutic Interventions/Progress Updates:      Therapy Documentation Precautions:  Precautions Precautions: Fall, Cervical, Other (comment) (HD MWF (RUE restricted).) Recall of Precautions/Restrictions: Impaired Precaution/Restrictions Comments: Verbally reviewed cervical precautions. Required Braces or Orthoses: Cervical Brace Cervical Brace: Hard collar Restrictions Weight Bearing Restrictions Per Provider Order: No General: "I did not eat lunch today" Pt supine in bed upon OT arrival, agreeable to OT session.  Pain: no pain reported  ADL: OT providing skilled intervention on ADL retraining in order to increase independence with tasks and increase activity tolerance. Pt completed the following tasks at the current level of assist: Bed mobility: SBA supine>EOB from flat bed, able to advance BLE to EOB Transfers: CGA lateral transfer with transfer board, OT assisted with placement of board, pt able to scoot across board with good trunk support and no LOB  Exercises: Pt participated in [facility] activity with large pegs and small PVC pipe, pt retrieving items  with BUE for increased dexterity and coordination in order to complete ADLs such as buttoning shirts. Pt completed at SBA with increased time for in hand manipulate with RUE. OT  requiring VC for attention to task during session.   Other Treatments: OT issued leg loop for RLE in order for pt to have increased independence with managing BLE for ADLs, transfers and bed mobility.    Pt seated in W/C at end of session with W/C alarm donned, call light within reach and 4Ps assessed.    Therapy/Group: Individual Therapy  [name], OTD, OTR/L 06/11/2023, 3:04 PM

## 2023-06-11 NOTE — Progress Notes (Signed)
 Subjective: Noted he was seen in gym with no complaints said tolerated dialysis yesterday  Objective Vital signs in last 24 hours: Vitals:   06/10/23 1828 06/10/23 1920 06/11/23 0501 06/11/23 0517  BP:  138/87 121/74   Pulse: (!) 107 (!) 106 (!) 106   Resp: (!) 27 16 17    Temp:  98.4 F (36.9 C) 99.6 F (37.6 C)   TempSrc:  Oral    SpO2: 99% 97% 99%   Weight:    94 kg  Height:       Weight change:   Physical Exam: General: Alert pleasant male in C-spine collar NAD Heart: RRR no MRG Lungs: CTA bilaterally nonlabored breathing Abdomen: NABS soft NT ND Extremities: No pedal edema Dialysis Access: R FA AVF positive bruit     OP Dialysis Orders [facility] - MWF 4h  B400   85.5kg   2K bath  AVF   - Heparin  3000 units IV three times per week  - Mircera 60mcg IV q 2 weeks- last dose 05/24/23  - Calcitriol  1.80mcg PO q HD   Problem/Plan:  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 Now in CIR ESRD: Off schedule last week, has resumed MWF schedule next dialysis Wednesday/ 4/30  HTN/volume: BP controlled/stable, Still above OP EDW. BUT No standing weights/ UF as tolerated. 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 Secondary HPTH: CorrCa/Phos ok - continue sevelamer , calcitriol , sensipar  for now. Nutrition: Alb 2.0, continue protein supplements  Charlynne Coombes, PA-C [facility]per [phone] 06/11/2023,10:47 AM  LOS: 12 days   Labs: Basic Metabolic Panel: Recent Labs  Lab 06/10/23 1416  NA 127*  K 6.3*  CL 90*  CO2 20*  GLUCOSE 133*  BUN 97*  CREATININE 12.41*  CALCIUM  9.1  PHOS 5.8*   Liver Function Tests: Recent Labs  Lab 06/10/23 1416  ALBUMIN  2.0*   No results for input(s): "LIPASE", "AMYLASE" in the last 168 hours. Recent Labs  Lab 06/10/23 1959  AMMONIA 20   CBC: Recent Labs  Lab 06/08/23 1248 06/10/23 1416  WBC 8.9 9.3  NEUTROABS 6.2  --   HGB 8.5* 7.7*   HCT 26.2* 24.5*  MCV 88.8 90.4  PLT 301 325   Cardiac Enzymes: No results for input(s): "CKTOTAL", "CKMB", "CKMBINDEX", "TROPONINI" in the last 168 hours. CBG: Recent Labs  Lab 06/09/23 2057 06/10/23 0607 06/10/23 1149 06/10/23 2037 06/11/23 0504  GLUCAP 126* 96 103* 143* 91      Medications:   (feeding supplement) PROSource Plus  30 mL Oral BID BM   atorvastatin   10 mg Oral Q Thu   calcitRIOL   1.5 mcg Oral Q M,W,F-HD   carbamide peroxide  5 drop Right EAR BID   Chlorhexidine  Gluconate Cloth  6 each Topical Q0600   cinacalcet   120 mg Oral Q supper   darbepoetin (ARANESP ) injection - DIALYSIS  150 mcg Subcutaneous Q Tue-1800   gabapentin   300 mg Oral QHS   Gerhardt's butt cream   Topical BID   pantoprazole   40 mg Oral BID AC   psyllium  1 packet Oral TID   senna-docusate  2 tablet Oral BID   sevelamer  carbonate  3,200 mg Oral TID WC   topiramate   25 mg Oral BID

## 2023-06-11 NOTE — Progress Notes (Signed)
 PROGRESS NOTE   Subjective/Complaints:  Pt reports hard collar is "rough" and was "told by Dr Adonis Alamin that he could transition to soft collar soon"- also wants to speak to Dr Adonis Alamin regarding prognosis.  LBM yesterday.  R hand swelling is gone.   Feeling pretty good this AM, except R knee still bothering him somewhat- very TTP- L knee is swollen, but not hurting him like R side is. Never had surgery on R knee. .   ROS:    Pt denies SOB, abd pain, CP, N/V/C/D, and vision changes  R hand swelling resolved Objective:   VAS US  LOWER EXTREMITY VENOUS (DVT) Result Date: 06/10/2023  Lower Venous DVT Study Patient Name:  [name].  Date of Exam:   06/10/2023 Medical Rec #: [id number]            Accession #:    [id number] Date of Birth: 02-02-1961             Patient Gender: M Patient Age:   63 years Exam Location:  [facility] Procedure:      VAS US  LOWER EXTREMITY VENOUS (DVT) Referring Phys: Janeece Mechanic --------------------------------------------------------------------------------  Indications: Edema.  Risk Factors: Surgery Cervical corpectomy 05/26/23. Comparison Study: No significant changes seen since previous exam 07/04/06. Performing Technologist: Estanislao Heimlich  Examination Guidelines: A complete evaluation includes B-mode imaging, spectral Doppler, color Doppler, and power Doppler as needed of all accessible portions of each vessel. Bilateral testing is considered an integral part of a complete examination. Limited examinations for reoccurring indications may be performed as noted. The reflux portion of the exam is performed with the patient in reverse Trendelenburg.  +---------+---------------+---------+-----------+----------+--------------+ RIGHT    CompressibilityPhasicitySpontaneityPropertiesThrombus Aging +---------+---------------+---------+-----------+----------+--------------+ CFV      Full           Yes       Yes                                 +---------+---------------+---------+-----------+----------+--------------+ SFJ      Full                                                        +---------+---------------+---------+-----------+----------+--------------+ FV Prox  Full                                                        +---------+---------------+---------+-----------+----------+--------------+ FV Mid   Full                                                        +---------+---------------+---------+-----------+----------+--------------+ FV DistalFull                                                        +---------+---------------+---------+-----------+----------+--------------+  PFV      Full                                                        +---------+---------------+---------+-----------+----------+--------------+ POP      Full           Yes      Yes                                 +---------+---------------+---------+-----------+----------+--------------+ PTV      Full                    Yes                                 +---------+---------------+---------+-----------+----------+--------------+ PERO     Full                    Yes                                 +---------+---------------+---------+-----------+----------+--------------+   +---------+---------------+---------+-----------+----------+--------------+ LEFT     CompressibilityPhasicitySpontaneityPropertiesThrombus Aging +---------+---------------+---------+-----------+----------+--------------+ CFV      Full           Yes      Yes                                 +---------+---------------+---------+-----------+----------+--------------+ SFJ      Full                                                        +---------+---------------+---------+-----------+----------+--------------+ FV Prox  Full                                                         +---------+---------------+---------+-----------+----------+--------------+ FV Mid   Full                                                        +---------+---------------+---------+-----------+----------+--------------+ FV DistalFull                                                        +---------+---------------+---------+-----------+----------+--------------+ PFV      Full                                                        +---------+---------------+---------+-----------+----------+--------------+  POP      Full           Yes      Yes                                 +---------+---------------+---------+-----------+----------+--------------+ PTV      Full                    Yes                                 +---------+---------------+---------+-----------+----------+--------------+ PERO     Full                    Yes                                 +---------+---------------+---------+-----------+----------+--------------+     Summary: BILATERAL: - No evidence of deep vein thrombosis seen in the lower extremities, bilaterally. -No evidence of popliteal cyst, bilaterally.   *See table(s) above for measurements and observations. Electronically signed by Irvin Mantel on 06/10/2023 at 2:19:27 PM.    Final    CT HEAD WO CONTRAST ( ) Result Date: 06/09/2023 CLINICAL DATA:  63 year old male with altered mental status. EXAM: CT HEAD WITHOUT CONTRAST TECHNIQUE: Contiguous axial images were obtained from the base of the skull through the vertex without intravenous contrast. RADIATION DOSE REDUCTION: This exam was performed according to the departmental dose-optimization program which includes automated exposure control, adjustment of the mA and/or kV according to patient size and/or use of iterative reconstruction technique. COMPARISON:  Brain MRI and head CT 05/25/2023. FINDINGS: Brain: Unusual extensive, diffuse pachymeningeal calcification which has been present since  at least 2013. Stable cerebral volume. No midline shift, ventriculomegaly, mass effect, evidence of mass lesion, intracranial hemorrhage or evidence of cortically based acute infarction. Small area of chronic encephalomalacia in the medial left occipital lobe redemonstrated. Stable gray-white matter differentiation throughout the brain. Vascular: Chronic severe pachymeningeal calcification. Skull: Appears stable.  No acute osseous abnormality identified. Sinuses/Orbits: Continued left middle ear and mastoid opacification. Other Visualized paranasal sinuses and mastoids are stable and well aerated. Other: Anterior left scalp soft tissue injury has regressed since earlier in the month. Orbits soft tissues appears stable. IMPRESSION: 1. No acute intracranial abnormality. 2. 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 3. Ongoing left middle ear and mastoid opacification. No acute osseous abnormality identified. 4. Regressed scalp soft tissue injury since 05/25/2023. Electronically Signed   By: Marlise Simpers M.D.   On: 06/09/2023 17:49      Recent Labs    06/08/23 1248 06/10/23 1416  WBC 8.9 9.3  HGB 8.5* 7.7*  HCT 26.2* 24.5*  PLT 301 325    Recent Labs    06/10/23 1416  NA 127*  K 6.3*  CL 90*  CO2 20*  GLUCOSE 133*  BUN 97*  CREATININE 12.41*  CALCIUM  9.1     Intake/Output Summary (Last 24 hours) at 06/11/2023 0817 Last data filed at 06/11/2023 0700 Gross per 24 hour  Intake 356 ml  Output 2.7 ml  Net 353.3 ml        Physical Exam: Vital Signs Blood pressure 121/74, pulse (!) 106, temperature 99.6 F (37.6 C), resp. rate 17, height 6\' 1"  (1.854 m),  weight 94 kg, SpO2 99%.       General: awake, alert, appropriate, sitting up with Stedy with NT and nurse; in room, going to bathroom; NAD HENT: conjugate gaze; oropharynx moist CV: regular  rhythm and tachycardic rate; no JVD Pulmonary: CTA B/L; no W/R/R- good air  movement GI: soft, NT, ND, (+)BS- normoactive Psychiatric: appropriate Neurological: Ox3  MsK: B/L knees with effusions, no erythema, not really focally warm to touch- Both have effusions, however R knee much more TTP R hand swelling looks good/resolved Skin: no evidence of cellulitis to extremities.   PRIOR EXAMS: Neurological: Ox3  MSK- R hand swelling esp ulnar side of hand/dorsum and L wrist-stable this AM RUE- Biceps 4/5; WE 4-/5; Triceps 2/5; Grip 2-/5 and FA 1/5 LUE- 4/5 except triceps 3+ to 4-/5 RLE- HF 1 to 2-/5; KE 2-/5; DF/PF 2-/5 LLE- HF 3/5; KE 4-/5; DF/PF 4/5    Skin: C/D/I. Multiple lesions on BL legs with various stages of healing.  + RUE fistula - intact   MSK:      LLE knee with palpable effusion, no warmth, no ttp Mood: Pleasant affect, appropriate mood.  Neurologic exam: Alert and oriented x 4, fluent, follows commands, cranial nerves II through XII grossly intact, no hypertonia noted  Sensation: Reduced to light touch in distal right lower extremity to the mid-thigh       Strength:                RUE: 2/5 SA, 2/5 EF, 2/5 EE, 2/5 WE, 2/5 FF, 2/5 FA                LUE:  3/5 SA, 3/5 EF, 3/5 EE, 3/5 WE, 3/5 FF, 3/5 FA                RLE: 2/5 HF, 2/5 KE, 2/5  DF, 2/5  EHL, 2/5  PF                 LLE:  3/5 HF, 4/5 KE, 4/5  DF, 4/5  EHL, 4/5  PF  Neuroexam stable 4/21  Assessment/Plan: 1. Functional deficits which require 3+ hours per day of interdisciplinary therapy in a comprehensive inpatient rehab setting. Physiatrist is providing close team supervision and 24 hour management of active medical problems listed below. Physiatrist and rehab team continue to assess barriers to discharge/monitor patient progress toward functional and medical goals  Care Tool:  Bathing    Body parts bathed by patient: Right arm, Chest, Abdomen, Right upper leg, Left upper leg, Face   Body parts bathed by helper: Left arm, Front perineal area, Buttocks, Right lower leg, Left  lower leg     Bathing assist Assist Level: Moderate Assistance - Patient 50 - 74%     Upper Body Dressing/Undressing Upper body dressing   What is the patient wearing?: Pull over shirt    Upper body assist Assist Level: Moderate Assistance - Patient 50 - 74%    Lower Body Dressing/Undressing Lower body dressing      What is the patient wearing?: Pants     Lower body assist Assist for lower body dressing: Maximal Assistance - Patient 25 - 49%     Toileting Toileting Toileting Activity did not occur (Clothing management and hygiene only): N/A (no void or bm) (anuric, HD patient)  Toileting assist Assist for toileting: Maximal Assistance - Patient 25 - 49%     Transfers Chair/bed transfer  Transfers assist     Chair/bed transfer assist level:  Moderate Assistance - Patient 50 - 74% (slideboard)     Locomotion Ambulation   Ambulation assist   Ambulation activity did not occur: Safety/medical concerns          Walk 10 feet activity   Assist  Walk 10 feet activity did not occur: Safety/medical concerns        Walk 50 feet activity   Assist Walk 50 feet with 2 turns activity did not occur: Safety/medical concerns         Walk 150 feet activity   Assist Walk 150 feet activity did not occur: Safety/medical concerns         Walk 10 feet on uneven surface  activity   Assist Walk 10 feet on uneven surfaces activity did not occur: Safety/medical concerns         Wheelchair     Assist Is the patient using a wheelchair?: Yes Type of Wheelchair: Power    Wheelchair assist level: Supervision/Verbal cueing Max wheelchair distance: 200+    Wheelchair 50 feet with 2 turns activity    Assist        Assist Level: Supervision/Verbal cueing   Wheelchair 150 feet activity     Assist      Assist Level: Supervision/Verbal cueing   Blood pressure 121/74, pulse (!) 106, temperature 99.6 F (37.6 C), resp. rate 17, height 6'  1" (1.854 m), weight 94 kg, SpO2 99%.  Medical Problem List and Plan: 1. 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 with Dr Gwendlyn Lemmings             -patient may shower without brace with surgical dressing covered - C collar apply/remove brace in sitting, may remove when in bed and to shower and may ambulate to bathroom without brace  -06/09/23 advised pt and son of this, would prefer to wait til Monday to remove collar, ordered soft collar for use when not in [place], just for comfort/reminder to not flex/extend much             -ELOS/Goals: 20-25 days, Min A PT/OT  D/C 4 weeks  Con't CIR PT and OT  Team conference today to f/u on progress  Family conference done last week  Contacted Dr Adonis Alamin about collar and pt's desire to d/w him about prognosis.  2.  Antithrombotics: -DVT/anticoagulation:  Mechanical: Sequential compression devices, below knee Bilateral lower extremities             -antiplatelet therapy: N/A--no antiplatelet due to hx of recurrent GIB  4/25- d/w family- they agree that Lovenox  not appropriate at this time.   -06/09/23 ordered dopplers to r/o clots given low grade temps recently  4/28- LE Dopplers (-) for DVT 3. Pain Management:  oxycodone  prn. Gabapentin  300 mg/hs.              --has Flexeril  10 mg TID prn              - patient with intermittent, severe b/l headache - add topomax 25 mg BID    4/19- pt rating pain 07/19/08 which is "tolerable"  4/22- pain slightly better this AM  4/25- fewer complaints of pain -06/09/23 having more b/l knee pain and using percocets more-- could be causing confusion? Advised judicious use, might benefit from knee aspirations/injections? 4/28- less knee pain this AM 4/29- stil causing some pain, but getting better 4. Mood/Behavior/Sleep: LCSW to follow for evaluation and support             -  antipsychotic agents: N/A             --melatonin prn for insomnia.  -06/09/23 poor sleep, d/t interruptions, asked nursing to  minimize interruptions between 11p-5a (apparently they can't put a sign up, but will communicate in huddle) 5. Neuropsych/cognition: This patient is capable of making decisions on his own behalf. -06/09/23 son concerned with confusion, could be from poor sleep/pain meds vs cefepime  toxicity; getting head CT, dopplers, consulted ID Dr. Levern Reader as below 4/28- cognitively intact this AM 6. Skin/Wound Care: Routine pressure relief measures.   4/19- due to ulcers on legs, on low air loss mattress 7. Fluids/Electrolytes/Nutrition: Strict I/O. Daily weights. Labs with HD. 8. ESRD- HD MWF: Schedule HD at the end of the day to help with tolerance of therapy             --Sensipar  and Renvela  for metabolic bone disease.   - Patient reports no longer makes urine  4/20- pt getting HD this AM- last HD was Wednesday.   4/21 dialysis moved to tomorrow  4/22- HD to be done today  4/25- Next HD today to get back on track  4/28- HD today 9. Anemia of chronic disease: Monitor H/H, aranesp   -4/21 HGB lower 7.5, FOBT, recheck tomorrow  4/22- HB 8.7 on today's check-con't to monitor  -06/09/23 hgb 8.5 yesterday  4/28- Hb down to 7.7- 4/29- will try to recheck Thursday  10. H/o recurrent GIB/Jehovah's Witness: Continue Protonix  BID 11. H/o Hypotension: Has had drop  in BP with HD. Now cervical SCI.  --Monitor for symptoms day after HD and with activity. -4/18 baseline blood pressure stable, monitor with activity 4/19- BP actually slightly elevated today- 140s-150's- con't regimen, since can drop with HD and therapy 4/20- BP actually doing better today- in 100s'110s- however HR elevated, likely to fluid overload 4/21 nephrology started midodrine  10mg  before dialysis, BP appears to be doing a little better today 4/22- less hypotensive this AM laying- will monitor for [facility] 4/23- Tachycardia slightly better- in high 90's- having some intermittent elevated BP's- will monitor 4/24- BP running 140-s150s, but don't want  to drop it since having HD 4/25- Having Hypotension with therapy, so don't want to reduce BP at all- doing TED's and ACE wraps- occ elevated, but with HD and therapy, not appropriate to reduce levels -06/08/23 BPs a little soft but HD yesterday; cont TEDs/ACE -06/09/23 BPs good, monitor  4/29- BP controlled- con't regimen Vitals:   06/10/23 1353 06/10/23 1406 06/10/23 1431 06/10/23 1502  BP: (!) 156/86 (!) 162/85 (!) 145/81 139/87   06/10/23 1530 06/10/23 1600 06/10/23 1637 06/10/23 1700  BP: (!) 145/80 128/75 125/80 125/73   06/10/23 1813 06/10/23 1816 06/10/23 1920 06/11/23 0501  BP: 138/82 123/75 138/87 121/74     12. Constipation: Small BM on 04/14 --had of nulytey yesterday and another today? -Xray 4/17 without excessive stool, patient reports stool still little hard yesterday.  Will increase MiraLAX  to 3 times a day, continue Senokot twice a day. Suppository PRN   06/09/23 LBM yesterday, cont regimen  4/28- LBM overnight- has been refusing miralax - made it prn- taking metamucil 13. ?left knee effusion. Not currently bothersome. Consider imaging.  14.  SIRS- Fever/hypotension/tachycardia - 4/21 Currently afebrile, he got to 50 cc IVF yesterday, CXR negative, EKG sinus tachycardia. Patient was started on Maxipime  and vancomycin  yesterday.  Appears to be doing much better today.  WBC not elevated at 7.1.  Continue to follow blood cultures 4/22- Blood Cx's (-)  so far- WBC 7.3- will con't IV ABX until know final results of blood Cx's- CXR (-)- but temp was 103.2 yesterday overnight.  4/23- Blood C'x (-) x2 days so far- T 99.9 this Am after a few sips of coffee- was 100.4 this AM- will con't IV ABX since concerned about possible bacteremia- esp since having low grade temps still 4/24- No more fevers, even low grade- no growth on blood Cx' bu it says that didn't get enough blood, so much be falsely negative 4/25- No growth for 5 days, but it also said not enough blood to be sure was a  true negative. Will change stop date til tomorrow -06/08/23 at 3am temp recorded as 100.3 but 5am was 98.3; will get CBC w/diff, monitor temps closely today, if continued low grade temps, will reach out to ID. BCx NGTD x5d. Didn't get vanc yesterday? Or cefepime ? Unclear why. Cefepime  started this morning at 0011. -06/09/23 temp 100.3 at 3am yesterday and then 100 later in the day, but no fevers since, no tachycardia.  -WBC yesterday 8.9.  -Now with some confusion; spoke with Dr. Levern Reader of ID, stated cefepime  could be the cause of the confusion -recommended we stop all abx (which ended yesterday anyway) -watch the patient OFF abx -if fevers >100.3 occur then she recommended repeat BCx and CXR and could call her back to see if any abx would be advised at that time.  -Head CT and dopplers ordered as well, as above 4/28- CT looks good- no fever, WBC is OK- off ABX 4/29- Also Dopplers (-) feeling good this AM 15. Tachycardia 4/22- doing better- right at 100 this AM- will monitor- likely can be from dregs of infection vs OH. -will monitor  4/23- Tachycardia improving- 4/24-27 HR in 90's right now- doing better  4/29- HR running in 90s-low 100's today 16. R hand swelling  4/23- doing ice- will do xrays 4/24- Xray doesn't look like cause of swelling, however could be due to new shunt? 17. Hypocalcemia 4/24- his Albumin  is 2.1, so probably ok, and phos coming down- will leave to renal- thank you for their help  18. Hyperkalemia 4/28- K+ 6.3 today- got hypoosmotic bath to reduce K+ per note from RN/HD nurse? Also has Na of 127- being addressed by HD/renal- Phos also 5.8- but again, getting HD today  19. R>L knee pain  4/29- will ask Ortho if they will tap his R knee- I have no problem doing injection of knee, however with so much fluid, I would rather Ortho tap him and then do steroid injection.    I spent a total of 55   minutes on total care today- >50% coordination of care- due to  Seeing pt 2x  today; calling Dr Adonis Alamin; speaking with PA and calling Ortho- also team conference today   LOS: 12 days A FACE TO FACE EVALUATION WAS PERFORMED  Lyvonne Cassell 06/11/2023, 8:17 AM

## 2023-06-11 NOTE — Progress Notes (Signed)
 Patient ID: Carl Dambra Sr., male   DOB: [birth year], 63 y.o.   MRN: [id number]  Met with pt and left message for son-Marcel to give team conference update regarding progress this week and continued stay of three weeks. Pt feels is getting better but will still require 24/7 care at discharge. Continue to work on discharge needs. Pt reports Dr Gwendlyn Lemmings to come by later an talk with he and family regarding prognosis. He is looking forward to this.

## 2023-06-11 NOTE — Progress Notes (Signed)
 Physical Therapy Session Note  Patient Details  Name: Carl Fekete Sr. MRN: [id number] Date of Birth: May 10, 1960  Today's Date: 06/11/2023 PT Individual Time: 1000-1100 PT Individual Time Calculation (min): 60 min   Short Term Goals: Week 2:  PT Short Term Goal 1 (Week 2): Pt will progress transfers to mod assist PT Short Term Goal 2 (Week 2): Pt will tolerate standing with lift equipment x 5 min preparation for gait PT Short Term Goal 3 (Week 2): Pt will be able to perform bed mobilty with min assist for supine <> sit  Skilled Therapeutic Interventions/Progress Updates:    Pt recd in PWC, no c/o pain. Pt navigated PWC with cues for navigation and attention to task as pt frequently distracted by perseveration on conversation with friend just prior to session or talking to therapist requiring repeated direct cues ( including need to stop before backing into another patient).  Session focused on standing frame training, with pt able to stand with support for >5 min x 2 bouts. Focus on weightbearing/stretch for spasticity management and performing standing quad sets for increased strength and neuromuscular control. During session, Dr. Gwendlyn Lemmings in/out to see pt and [name], PA in/out to discuss pt's knee pain and swelling. Therapist assisted in providing education on standard care for joint pain.  Pt returned to room and remained in PWC, was left with all needs in reach.   Therapy Documentation Precautions:  Precautions Precautions: Fall, Cervical, Other (comment) (HD MWF (RUE restricted).) Recall of Precautions/Restrictions: Impaired Precaution/Restrictions Comments: Verbally reviewed cervical precautions. Required Braces or Orthoses: Cervical Brace Cervical Brace: Hard collar Restrictions Weight Bearing Restrictions Per Provider Order: No General:       Therapy/Group: Individual Therapy  Tex Filbert 06/11/2023, 12:46 PM

## 2023-06-11 NOTE — Progress Notes (Signed)
 Physical Therapy Session Note  Patient Details  Name: Carl Lollis Sr. MRN: [id number] Date of Birth: 1960/02/27  Today's Date: 06/11/2023 PT Individual Time: 4098-1191 PT Individual Time Calculation (min): 15 min  and Today's Date: 06/11/2023 PT Missed Time: 15 Minutes Missed Time Reason: Xray;CT/MRI (imaging)  Short Term Goals: Week 1:  PT Short Term Goal 1 (Week 1): pt will perform STS with RW with assist of 1 PT Short Term Goal 1 - Progress (Week 1): Not met PT Short Term Goal 2 (Week 1): Pt will perform stand pivot with LRAD with assist of 1 PT Short Term Goal 2 - Progress (Week 1): Not met PT Short Term Goal 3 (Week 1): pt will initiate gait training PT Short Term Goal 3 - Progress (Week 1): Not met Week 2:  PT Short Term Goal 1 (Week 2): Pt will progress transfers to mod assist PT Short Term Goal 2 (Week 2): Pt will tolerate standing with lift equipment x 5 min preparation for gait PT Short Term Goal 3 (Week 2): Pt will be able to perform bed mobilty with min assist for supine <> sit  Skilled Therapeutic Interventions/Progress Updates:  Patient seated upright in PWC on entrance to room. Providing dinner, breakfast and lunch order to hostess. Patient alert and agreeable to PT session. Initiating introduction to pt when transport arriving to room to take pt to imaging.   Related to pt need for transport in bed and pt not initially happy about having to get back in bed. RN present to assist with transitioning pt.   Therapeutic Activity: Transfers: Pt self positioned PWC in front of STEDY and is able to power down without vc to perform. Pt performed sit<>stand transfer using  STEDY and requires MaxA from lower seat height with BLE elevated on STEDY platform. Requires 2 attempts to reach upright stance requiring MaxA +2. Stands from [employer] seat of STEDY with MinA+2. Descends to sit on EOB with MinA +1. Provided vc/ tc throughout for effort and positioning. Bed Mobility: Pt performed  sit>supine with modA +2. VC/ tc required for initiation and maintaining effort. Assists with moving up in bed but still requires Mod/ MaxA +2.   Patient supine in bed at end of session with brakes locked and Transport Tech taking over care in shuttling pt to imaging.   Therapy Documentation Precautions:  Precautions Precautions: Fall, Cervical, Other (comment) (HD MWF (RUE restricted).) Recall of Precautions/Restrictions: Impaired Precaution/Restrictions Comments: Verbally reviewed cervical precautions. Required Braces or Orthoses: Cervical Brace Cervical Brace: Hard collar Restrictions Weight Bearing Restrictions Per Provider Order: No General: PT Amount of Missed Time (min): 15 Minutes PT Missed Treatment Reason: Xray;CT/MRI (imaging)  Pain: No pain related by pt during this shortened session.    Therapy/Group: Individual Therapy  Donne Gage PT, DPT, CSRS 06/11/2023, 1:29 PM

## 2023-06-11 NOTE — Plan of Care (Signed)
  Problem: SCI BOWEL ELIMINATION Goal: RH STG MANAGE BOWEL WITH ASSISTANCE Description: STG Manage Bowel with minimal Assistance. Outcome: Progressing   Problem: RH SKIN INTEGRITY Goal: RH STG SKIN FREE OF INFECTION/BREAKDOWN Description: Manage skin free of infection/breakdown with minimal assistance Outcome: Progressing   Problem: RH SAFETY Goal: RH STG ADHERE TO SAFETY PRECAUTIONS W/ASSISTANCE/DEVICE Description: STG Adhere to Safety Precautions With minimal Assistance/Device. Outcome: Progressing   Problem: RH PAIN MANAGEMENT Goal: RH STG PAIN MANAGED AT OR BELOW PT'S PAIN GOAL Description: Pain<4 w/ prns Outcome: Progressing   Problem: RH KNOWLEDGE DEFICIT SCI Goal: RH STG INCREASE KNOWLEDGE OF SELF CARE AFTER SCI Description: Manage increase knowledge of self care after SCI with minimal assistance using educational materials provided to son Outcome: Progressing

## 2023-06-12 DIAGNOSIS — I12 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 stage renal disease: Secondary | ICD-10-CM | POA: Diagnosis not present

## 2023-06-12 DIAGNOSIS — Z992 Dependence on renal dialysis: Secondary | ICD-10-CM | POA: Diagnosis not present

## 2023-06-12 DIAGNOSIS — N186 End stage renal disease: Secondary | ICD-10-CM | POA: Diagnosis not present

## 2023-06-12 LAB — CBC WITH DIFFERENTIAL/PLATELET
Abs Immature Granulocytes: 0.03 10*3/uL (ref 0.00–0.07)
Basophils Absolute: 0.1 10*3/uL (ref 0.0–0.1)
Basophils Relative: 1 %
Eosinophils Absolute: 0.1 10*3/uL (ref 0.0–0.5)
Eosinophils Relative: 1 %
HCT: 22.6 % — ABNORMAL LOW (ref 39.0–52.0)
Hemoglobin: 7.3 g/dL — ABNORMAL LOW (ref 13.0–17.0)
Immature Granulocytes: 1 %
Lymphocytes Relative: 11 %
Lymphs Abs: 0.7 10*3/uL (ref 0.7–4.0)
MCH: 29.1 pg (ref 26.0–34.0)
MCHC: 32.3 g/dL (ref 30.0–36.0)
MCV: 90 fL (ref 80.0–100.0)
Monocytes Absolute: 1.1 10*3/uL — ABNORMAL HIGH (ref 0.1–1.0)
Monocytes Relative: 17 %
Neutro Abs: 4.4 10*3/uL (ref 1.7–7.7)
Neutrophils Relative %: 69 %
Platelets: 370 10*3/uL (ref 150–400)
RBC: 2.51 MIL/uL — ABNORMAL LOW (ref 4.22–5.81)
RDW: 15.3 % (ref 11.5–15.5)
WBC: 6.3 10*3/uL (ref 4.0–10.5)
nRBC: 0 % (ref 0.0–0.2)

## 2023-06-12 LAB — BASIC METABOLIC PANEL WITH GFR
Anion gap: 16 — ABNORMAL HIGH (ref 5–15)
BUN: 88 mg/dL — ABNORMAL HIGH (ref 8–23)
CO2: 22 mmol/L (ref 22–32)
Calcium: 9.3 mg/dL (ref 8.9–10.3)
Chloride: 91 mmol/L — ABNORMAL LOW (ref 98–111)
Creatinine, Ser: 9.88 mg/dL — ABNORMAL HIGH (ref 0.61–1.24)
GFR, Estimated: 5 mL/min — ABNORMAL LOW (ref >60)
Glucose, Bld: 121 mg/dL — ABNORMAL HIGH (ref 70–99)
Potassium: 5.6 mmol/L — ABNORMAL HIGH (ref 3.5–5.1)
Sodium: 129 mmol/L — ABNORMAL LOW (ref 135–145)

## 2023-06-12 MED ORDER — SEVELAMER CARBONATE 800 MG PO TABS
3200.0000 mg | ORAL_TABLET | Freq: Three times a day (TID) | ORAL | Status: DC
Start: 2023-06-12 — End: 2023-07-15
  Administered 2023-06-12 – 2023-07-15 (×94): 3200 mg via ORAL
  Filled 2023-06-12 (×95): qty 4

## 2023-06-12 MED ORDER — CALCITRIOL 0.5 MCG PO CAPS
ORAL_CAPSULE | ORAL | Status: AC
  Filled 2023-06-12: qty 3

## 2023-06-12 MED ORDER — HEPARIN SODIUM (PORCINE) 1000 UNIT/ML DIALYSIS: 3000.0000 [IU] | Freq: Once | INTRAMUSCULAR | Status: DC

## 2023-06-12 MED ORDER — HEPARIN SODIUM (PORCINE) 1000 UNIT/ML DIALYSIS
3000.0000 [IU] | INTRAMUSCULAR | Status: DC | PRN
  Administered 2023-06-12 (×2): 3000 [IU] via INTRAVENOUS_CENTRAL

## 2023-06-12 NOTE — Progress Notes (Signed)
 Occupational Therapy Session Note  Patient Details  Name: Carl Bach Sr. MRN: [id number] Date of Birth: 03/09/1960  Today's Date: 06/12/2023 OT Individual Time: 4098-1191 OT Individual Time Calculation (min): 45 min    Short Term Goals: Week 2:  OT Short Term Goal 1 (Week 2): Pt will complete bed mobility at Mod A in preparation for LB dressing at bed level OT Short Term Goal 2 (Week 2): Pt will complete self feeding at Min A with LUE with AE as necessary OT Short Term Goal 3 (Week 2): Pt will complete UB dressing at Mod A  Skilled Therapeutic Interventions/Progress Updates:   Pt received sitting in the power w/c with no c/o pain, agreeable to OT session. He requested to weigh himself not in the bed. He navigated power w/c to the therapy gym with (S), one instance of almost hitting his arm on the wall. OT navigated w/c onto scale, then pt transferred via SB to the mat- CGA, and then OT weighed chair on its own. Pt weight 190.7 lbs. Pt completed various core stabilization exercises EOM, with focus on managing intraabdominal pressure, core strengthening, and coordination to challenge dynamic sitting balance, improved stability in standing, and ultimately reduced fall risk during ADLs. Skilled cueing required for proper muscle engagement, as well as grading of resistance to provide appropriate difficulty level. He transferred back to his w/c with CGA once SB was placed. He navigated back to his room and transferred to supine. Only min A to bring BLE into bed. He was left supine with all needs met, bed alarm set.   Therapy Documentation Precautions:  Precautions Precautions: Fall, Cervical, Other (comment) (HD MWF (RUE restricted).) Recall of Precautions/Restrictions: Impaired Precaution/Restrictions Comments: Verbally reviewed cervical precautions. Required Braces or Orthoses: Cervical Brace Cervical Brace: Hard collar Restrictions Weight Bearing Restrictions Per Provider Order:  No Therapy/Group: Individual Therapy  Una Ganser 06/12/2023, 6:43 AM

## 2023-06-12 NOTE — Progress Notes (Signed)
 Occupational Therapy Session Note  Patient Details  Name: [name]. MRN: [id number] Date of Birth: 1960/08/31  Today's Date: 06/12/2023 OT Individual Time: 3329-5188 OT Individual Time Calculation (min): 77 min    Short Term Goals: Week 2:  OT Short Term Goal 1 (Week 2): Pt will complete bed mobility at Mod A in preparation for LB dressing at bed level OT Short Term Goal 2 (Week 2): Pt will complete self feeding at Min A with LUE with AE as necessary OT Short Term Goal 3 (Week 2): Pt will complete UB dressing at Mod A  Skilled Therapeutic Interventions/Progress Updates:  Pt greeted seated in PWC eating breakfast, pt agreeable to OT intervention.      Transfers/bed mobility/functional mobility: pt completed w/c mobility with supervision. Pt completed Sb transfers from PWC<>mat table with total A for board placement with CGA with pt stating " step back."   Pt completed sit>stands from mat table with eva walker with pt able to stand from EOM with MODA +2 ( +2 needed for second person to manage lever to elevate walker). Pt able to stand and march in place for ~ 3 mins with MIN- MODA for balance.   Therapeutic activity:  Worked on functional reaching with LUE in standing with pt instructed to reach to mirror to remove squigz from mirror to challenge, functional reach, grip strength in LUE and dynamic balance. Pt required MAX cues for optional positioning of grasp on squigz and MIn- MODA for standing balance.   Additionally issued pt compliant sponge to work on functional grasp in LUE.    ADLs:   Footwear: donned teds and socks from EOM with total A   Self feeding: pt completing self feeding with LUE with built up handles with set- up assist provided to don handles on utensils.    Cognition: pt often perseverating on extraneous topics needing firm reorientation back to task at hand.                 Ended session with pt in PWC In gym awaiting his next therapy.                  Therapy Documentation Precautions:  Precautions Precautions: Fall, Cervical, Other (comment) (HD MWF (RUE restricted).) Recall of Precautions/Restrictions: Impaired Precaution/Restrictions Comments: Verbally reviewed cervical precautions. Required Braces or Orthoses: Cervical Brace Cervical Brace: Hard collar Restrictions Weight Bearing Restrictions Per Provider Order: No  Pain: No pain reported during session     Therapy/Group: Individual Therapy  Mollie Anger [facility] 06/12/2023, 12:03 PM

## 2023-06-12 NOTE — Progress Notes (Signed)
 Subjective: Sitting on side of bed no complaints, for HD today.  Objective Vital signs in last 24 hours: Vitals:   06/11/23 0517 06/11/23 1404 06/11/23 1959 06/12/23 0517  BP:  135/78 135/77 116/80  Pulse:  94 91 93  Resp:  16 18 16   Temp:  97.9 F (36.6 C) 98.2 F (36.8 C) 98.5 F (36.9 C)  TempSrc:   Oral Oral  SpO2:  100% 100% 100%  Weight: 94 kg   93.6 kg  Height:       Weight change: 0.3 kg  Physical Exam: General: Alert pleasant male in C-spine collar NAD Heart: RRR no MRG Lungs: CTA bilaterally nonlabored breathing Abdomen: NABS soft NT ND Extremities: No pedal edema Dialysis Access: R FA AVF positive bruit     OP Dialysis Orders [facility] - MWF 4h  B400   85.5kg   2K bath  AVF   - Heparin  3000 units IV three times per week  - Mircera 60mcg IV q 2 weeks- last dose 05/24/23  - Calcitriol  1.5mcg PO q HD   Problem/Plan:  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 Now in CIR ESRD: Off schedule last week, has resumed MWF schedule dialysis today, noted K6.3 yesterday check labs predialysis today stressed the patient follow renal diet in hospital HTN/volume: BP controlled/stable, Still above OP EDW. BUT No standing weights/ UF as tolerated. 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 Secondary HPTH: CorrCa >10, will hold calcitriol  for now/Phos 5.8- continue sevelamer ,  sensipar  for now. Nutrition: Alb 2.0, continue protein supplements  Carl Coombes, PA-C [facility]per [phone] 06/12/2023,1:40 PM  LOS: 13 days   Labs: Basic Metabolic Panel: Recent Labs  Lab 06/10/23 1416  NA 127*  K 6.3*  CL 90*  CO2 20*  GLUCOSE 133*  BUN 97*  CREATININE 12.41*  CALCIUM  9.1  PHOS 5.8*   Liver Function Tests: Recent Labs  Lab 06/10/23 1416  ALBUMIN  2.0*   No results for input(s): "LIPASE", "AMYLASE" in the last 168 hours. Recent Labs  Lab 06/10/23 1959  AMMONIA 20    CBC: Recent Labs  Lab 06/08/23 1248 06/10/23 1416  WBC 8.9 9.3  NEUTROABS 6.2  --   HGB 8.5* 7.7*  HCT 26.2* 24.5*  MCV 88.8 90.4  PLT 301 325   Cardiac Enzymes: No results for input(s): "CKTOTAL", "CKMB", "CKMBINDEX", "TROPONINI" in the last 168 hours. CBG: Recent Labs  Lab 06/10/23 1149 06/10/23 2037 06/11/23 0504 06/11/23 1127 06/11/23 1614  GLUCAP 103* 143* 91 107* 120*      Medications:   (feeding supplement) PROSource Plus  30 mL Oral BID BM   atorvastatin   10 mg Oral Q Thu   calcitRIOL   1.5 mcg Oral Q M,W,F-HD   carbamide peroxide  5 drop Right EAR BID   Chlorhexidine  Gluconate Cloth  6 each Topical Q0600   Chlorhexidine  Gluconate Cloth  6 each Topical Q0600   cinacalcet   120 mg Oral Q supper   darbepoetin (ARANESP ) injection - DIALYSIS  150 mcg Subcutaneous Q Tue-1800   gabapentin   300 mg Oral QHS   Gerhardt's butt cream   Topical BID   pantoprazole   40 mg Oral BID AC   predniSONE   40 mg Oral Q supper   psyllium  1 packet Oral TID   senna-docusate  2 tablet Oral BID   sevelamer  carbonate  3,200 mg Oral TID WC   topiramate   25  mg Oral BID

## 2023-06-12 NOTE — Progress Notes (Signed)
 Physical Therapy Session Note  Patient Details  Name: Carl Broner Sr. MRN: [id number] Date of Birth: [birth year]  Today's Date: 06/12/2023 PT Individual Time: 0901-1018 PT Individual Time Calculation (min): 77 min   Short Term Goals: Week 2:  PT Short Term Goal 1 (Week 2): Pt will progress transfers to mod assist PT Short Term Goal 2 (Week 2): Pt will tolerate standing with lift equipment x 5 min preparation for gait PT Short Term Goal 3 (Week 2): Pt will be able to perform bed mobilty with min assist for supine <> sit  Skilled Therapeutic Interventions/Progress Updates: Pt presented in PWC agreeable to therapy. Pt spent several minutes speaking with Pam, PA regarding expectations of recovery and d/c planning. Once done pt agreeable to work on standing frame activities for weight bearing through BLE, NMR, and isometric strengthening. Pt was able to navigate PWC to day room with supervision and PTA assisted with proper alignment of PWC to standing frame. Pt then tolerated standing frame for 20 min while participating in activities such as TKE, glute sets, and mini squats. Pt with no s/s OH and was engaged throughout time standing. While in standing pt also worked on maintaining midline while in standing however was unable to achieve full erect stand (unable to achieve TKE). Once completed pt worked on anterior and posterior scooting in PWC. Pt also worked on [employer] x 10 bilaterally with PTA providing assist for completion of extension. Pt navigated back to room with supervision and noted to be able to park alongside bed without hitting objects. Pt left in PWC at end of session with call bell within reach and needs met.      Therapy Documentation Precautions:  Precautions Precautions: Fall, Cervical, Other (comment) (HD MWF (RUE restricted).) Recall of Precautions/Restrictions: Impaired Precaution/Restrictions Comments: Verbally reviewed cervical precautions. Required Braces or Orthoses: Cervical  Brace Cervical Brace: Hard collar Restrictions Weight Bearing Restrictions Per Provider Order: No General:   Vital Signs: Therapy Vitals Temp: 98.3 F (36.8 C) Pulse Rate: 85 Resp: 18 BP: 131/78 Oxygen Therapy SpO2: 99 % O2 Device: Room Air Pain: Pain Assessment Pain Scale: 0-10 Pain Score: 0-No pain Pain Type: Acute pain Pain Location: Knee Mobility:   Locomotion :    Trunk/Postural Assessment :    Balance:   Exercises:   Other Treatments:      Therapy/Group: Individual Therapy  Mio Schellinger 06/12/2023, 4:21 PM

## 2023-06-12 NOTE — Progress Notes (Signed)
 PROGRESS NOTE   Subjective/Complaints:  Pt reports didn't get renvela  before his meal this AM- and doesn't frequently- asking how ot change- saw order was at 8am- will change to 6:30am, as well as timing on lunch and dinner dosing as well.   Also got food late this AM and has therapy at 7:30 am today. Said Dr Adonis Alamin ordered a different collar, but couldn't find order.  Small BM overnight, but feels like needs to go again- asking for Sorbitol   after therapy/HD to try and go.   D/W Dr Adonis Alamin- will change to soft collar   ROS:    Pt denies SOB, abd pain, CP, N/V/C/D, and vision changes   R hand swelling resolved Objective:   DG Knee Complete 4 Views Right Result Date: 06/11/2023 CLINICAL DATA:  Chronic bilateral knee pain and swelling. EXAM: RIGHT KNEE - COMPLETE 4+ VIEW; LEFT KNEE - COMPLETE 4+ VIEW COMPARISON:  Right knee radiographs dated 12/26/2021. FINDINGS: Right knee: No acute fracture or dislocation. There is a small to moderate-sized joint effusion. Moderate tricompartmental degenerative changes with joint space narrowing and marginal osteophytosis. Vascular calcifications are noted. Left knee: No acute fracture or dislocation. There is a small joint effusion. Moderate tricompartmental degenerative changes with joint space narrowing and marginal osteophytosis. Vascular calcifications are noted. IMPRESSION: 1. No acute osseous abnormality. 2. 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 Electronically Signed   By: Mannie Seek M.D.   On: 06/11/2023 17:11   DG Knee Complete 4 Views Left Result Date: 06/11/2023 CLINICAL DATA:  Chronic bilateral knee pain and swelling. EXAM: RIGHT KNEE - COMPLETE 4+ VIEW; LEFT KNEE - COMPLETE 4+ VIEW COMPARISON:  Right knee radiographs dated 12/26/2021. FINDINGS: Right knee: No acute fracture or dislocation. There is a  small to moderate-sized joint effusion. Moderate tricompartmental degenerative changes with joint space narrowing and marginal osteophytosis. Vascular calcifications are noted. Left knee: No acute fracture or dislocation. There is a small joint effusion. Moderate tricompartmental degenerative changes with joint space narrowing and marginal osteophytosis. Vascular calcifications are noted. IMPRESSION: 1. No acute osseous abnormality. 2. 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 Electronically Signed   By: Mannie Seek M.D.   On: 06/11/2023 17:11   VAS US  LOWER EXTREMITY VENOUS (DVT) Result Date: 06/10/2023  Lower Venous DVT Study Patient Name:  Carl Maruyama Sr.  Date of Exam:   06/10/2023 Medical Rec #: [id number]            Accession #:    [id number] Date of Birth: Dec 13, 1960             Patient Gender: M Patient Age:   63 years Exam Location:  [facility] Procedure:      VAS US  LOWER EXTREMITY VENOUS (DVT) Referring Phys: Janeece Mechanic --------------------------------------------------------------------------------  Indications: Edema.  Risk Factors: Surgery Cervical corpectomy 05/26/23. Comparison Study: No significant changes seen since previous exam 07/04/06. Performing Technologist: Estanislao Heimlich  Examination Guidelines: A complete evaluation includes B-mode imaging, spectral Doppler, color Doppler, and power Doppler as needed of all accessible portions of each vessel. Bilateral testing is considered  an integral part of a complete examination. Limited examinations for reoccurring indications may be performed as noted. The reflux portion of the exam is performed with the patient in reverse Trendelenburg.  +---------+---------------+---------+-----------+----------+--------------+ RIGHT    CompressibilityPhasicitySpontaneityPropertiesThrombus Aging  +---------+---------------+---------+-----------+----------+--------------+ CFV      Full           Yes      Yes                                 +---------+---------------+---------+-----------+----------+--------------+ SFJ      Full                                                        +---------+---------------+---------+-----------+----------+--------------+ FV Prox  Full                                                        +---------+---------------+---------+-----------+----------+--------------+ FV Mid   Full                                                        +---------+---------------+---------+-----------+----------+--------------+ FV DistalFull                                                        +---------+---------------+---------+-----------+----------+--------------+ PFV      Full                                                        +---------+---------------+---------+-----------+----------+--------------+ POP      Full           Yes      Yes                                 +---------+---------------+---------+-----------+----------+--------------+ PTV      Full                    Yes                                 +---------+---------------+---------+-----------+----------+--------------+ PERO     Full                    Yes                                 +---------+---------------+---------+-----------+----------+--------------+   +---------+---------------+---------+-----------+----------+--------------+ LEFT     CompressibilityPhasicitySpontaneityPropertiesThrombus Aging +---------+---------------+---------+-----------+----------+--------------+ CFV      Full  Yes      Yes                                 +---------+---------------+---------+-----------+----------+--------------+ SFJ      Full                                                         +---------+---------------+---------+-----------+----------+--------------+ FV Prox  Full                                                        +---------+---------------+---------+-----------+----------+--------------+ FV Mid   Full                                                        +---------+---------------+---------+-----------+----------+--------------+ FV DistalFull                                                        +---------+---------------+---------+-----------+----------+--------------+ PFV      Full                                                        +---------+---------------+---------+-----------+----------+--------------+ POP      Full           Yes      Yes                                 +---------+---------------+---------+-----------+----------+--------------+ PTV      Full                    Yes                                 +---------+---------------+---------+-----------+----------+--------------+ PERO     Full                    Yes                                 +---------+---------------+---------+-----------+----------+--------------+     Summary: BILATERAL: - No evidence of deep vein thrombosis seen in the lower extremities, bilaterally. -No evidence of popliteal cyst, bilaterally.   *See table(s) above for measurements and observations. Electronically signed by Irvin Mantel on 06/10/2023 at 2:19:27 PM.    Final       Recent Labs    06/10/23 1416  WBC 9.3  HGB 7.7*  HCT 24.5*  PLT 325    Recent Labs  06/10/23 1416  NA 127*  K 6.3*  CL 90*  CO2 20*  GLUCOSE 133*  BUN 97*  CREATININE 12.41*  CALCIUM  9.1     Intake/Output Summary (Last 24 hours) at 06/12/2023 0831 Last data filed at 06/11/2023 1829 Gross per 24 hour  Intake 237 ml  Output --  Net 237 ml        Physical Exam: Vital Signs Blood pressure 116/80, pulse 93, temperature 98.5 F (36.9 C), temperature source Oral, resp. rate 16,  height 6\' 1"  (1.854 m), weight 93.6 kg, SpO2 100%.       General: awake, alert, appropriate, sitting up in w/c- feeding self with L hand/built up utensil I fixed for him; NAD HENT: conjugate gaze; oropharynx moist CV: regular rate and rhythm; no JVD Pulmonary: CTA B/L; no W/R/R- good air movement GI: soft, NT, ND, (+)BS- slightly hyperactive Psychiatric: appropriate- interactive- almost jovial today Neurological: Ox3  MsK: B/L knees with effusions, no erythema, not really focally warm to touch- Both have effusions, however R knee much more TTP- about the same today R hand swelling looks good/resolved Skin: no evidence of cellulitis to extremities.   PRIOR EXAMS: Neurological: Ox3  MSK- R hand swelling esp ulnar side of hand/dorsum and L wrist-stable this AM RUE- Biceps 4/5; WE 4-/5; Triceps 2/5; Grip 2-/5 and FA 1/5 LUE- 4/5 except triceps 3+ to 4-/5 RLE- HF 1 to 2-/5; KE 2-/5; DF/PF 2-/5 LLE- HF 3/5; KE 4-/5; DF/PF 4/5    Skin: C/D/I. Multiple lesions on BL legs with various stages of healing.  + RUE fistula - intact   MSK:      LLE knee with palpable effusion, no warmth, no ttp Mood: Pleasant affect, appropriate mood.  Neurologic exam: Alert and oriented x 4, fluent, follows commands, cranial nerves II through XII grossly intact, no hypertonia noted  Sensation: Reduced to light touch in distal right lower extremity to the mid-thigh       Strength:                RUE: 2/5 SA, 2/5 EF, 2/5 EE, 2/5 WE, 2/5 FF, 2/5 FA                LUE:  3/5 SA, 3/5 EF, 3/5 EE, 3/5 WE, 3/5 FF, 3/5 FA                RLE: 2/5 HF, 2/5 KE, 2/5  DF, 2/5  EHL, 2/5  PF                 LLE:  3/5 HF, 4/5 KE, 4/5  DF, 4/5  EHL, 4/5  PF  Neuroexam stable 4/21  Assessment/Plan: 1. Functional deficits which require 3+ hours per day of interdisciplinary therapy in a comprehensive inpatient rehab setting. Physiatrist is providing close team supervision and 24 hour management of active medical problems  listed below. Physiatrist and rehab team continue to assess barriers to discharge/monitor patient progress toward functional and medical goals  Care Tool:  Bathing    Body parts bathed by patient: Right arm, Chest, Abdomen, Right upper leg, Left upper leg, Face   Body parts bathed by helper: Left arm, Front perineal area, Buttocks, Right lower leg, Left lower leg     Bathing assist Assist Level: Moderate Assistance - Patient 50 - 74%     Upper Body Dressing/Undressing Upper body dressing   What is the patient wearing?: Pull over shirt    Upper body assist Assist Level: Moderate  Assistance - Patient 50 - 74%    Lower Body Dressing/Undressing Lower body dressing      What is the patient wearing?: Pants     Lower body assist Assist for lower body dressing: Maximal Assistance - Patient 25 - 49%     Toileting Toileting Toileting Activity did not occur (Clothing management and hygiene only): N/A (no void or bm) (anuric, HD patient)  Toileting assist Assist for toileting: Maximal Assistance - Patient 25 - 49%     Transfers Chair/bed transfer  Transfers assist     Chair/bed transfer assist level: Moderate Assistance - Patient 50 - 74% (slideboard)     Locomotion Ambulation   Ambulation assist   Ambulation activity did not occur: Safety/medical concerns          Walk 10 feet activity   Assist  Walk 10 feet activity did not occur: Safety/medical concerns        Walk 50 feet activity   Assist Walk 50 feet with 2 turns activity did not occur: Safety/medical concerns         Walk 150 feet activity   Assist Walk 150 feet activity did not occur: Safety/medical concerns         Walk 10 feet on uneven surface  activity   Assist Walk 10 feet on uneven surfaces activity did not occur: Safety/medical concerns         Wheelchair     Assist Is the patient using a wheelchair?: Yes Type of Wheelchair: Power    Wheelchair assist level:  Supervision/Verbal cueing Max wheelchair distance: 200+    Wheelchair 50 feet with 2 turns activity    Assist        Assist Level: Supervision/Verbal cueing   Wheelchair 150 feet activity     Assist      Assist Level: Supervision/Verbal cueing   Blood pressure 116/80, pulse 93, temperature 98.5 F (36.9 C), temperature source Oral, resp. rate 16, height 6\' 1"  (1.854 m), weight 93.6 kg, SpO2 100%.  Medical Problem List and Plan: 1. 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  Antithrombotics: -DVT/anticoagulation:  Mechanical: Sequential compression devices, below knee Bilateral lower extremities             -antiplatelet therapy: N/A--no antiplatelet due to hx of recurrent GIB  4/25- d/w family- they agree that Lovenox  not appropriate at this time.   -06/09/23 ordered dopplers to r/o clots given low grade temps recently  4/28- LE Dopplers (-) for DVT 3. Pain Management:  oxycodone  prn. Gabapentin  300 mg/hs.              --has Flexeril  10 mg TID prn              - patient with intermittent, severe b/l headache -  add topomax 25 mg BID    4/19- pt rating pain 07/19/08 which is "tolerable"  4/22- pain slightly better this AM  4/25- fewer complaints of pain -06/09/23 having more b/l knee pain and using percocets more-- could be causing confusion? Advised judicious use, might benefit from knee  aspirations/injections? 4/28- less knee pain this AM 4/30- pain about the same- just started Prednisone  yesterday per Ortho Steffanie Edouard request 4. Mood/Behavior/Sleep: LCSW to follow for evaluation and support             -antipsychotic agents: N/A             --melatonin prn for insomnia.  -06/09/23 poor sleep, d/t interruptions, asked nursing to minimize interruptions between 11p-5a (apparently they can't put a sign up, but will communicate in huddle) 5. Neuropsych/cognition: This patient is capable of making decisions on his own behalf. -06/09/23 son concerned with confusion, could be from poor sleep/pain meds vs cefepime  toxicity; getting head CT, dopplers, consulted ID Dr. Levern Reader as below 4/28- cognitively intact this AM 4/30- doing well cognitively and brighter affect 6. Skin/Wound Care: Routine pressure relief measures.   4/19- due to ulcers on legs, on low air loss mattress 7. Fluids/Electrolytes/Nutrition: Strict I/O. Daily weights. Labs with HD. 8. ESRD- HD MWF: Schedule HD at the end of the day to help with tolerance of therapy             --Sensipar  and Renvela  for metabolic bone disease.   - Patient reports no longer makes urine  4/20- pt getting HD this AM- last HD was Wednesday.   4/21 dialysis moved to tomorrow  4/22- HD to be done today  4/25- Next HD today to get back on track  4/28- HD today  4/30- supposed to have HD today- will ask they recheck labs 9. Anemia of chronic disease: Monitor H/H, aranesp   -4/21 HGB lower 7.5, FOBT, recheck tomorrow  4/22- HB 8.7 on today's check-con't to monitor  -06/09/23 hgb 8.5 yesterday  4/28- Hb down to 7.7- 4/29- will try to recheck Thursday  4/30- if they don't check in HD, will check tomorrow 10. H/o recurrent GIB/Jehovah's Witness: Continue Protonix  BID 11. H/o Hypotension: Has had drop  in BP with HD. Now cervical SCI.  --Monitor for symptoms day after HD and with activity. -4/18 baseline blood pressure stable, monitor with  activity 4/19- BP actually slightly elevated today- 140s-150's- con't regimen, since can drop with HD and therapy 4/20- BP actually doing better today- in 100s'110s- however HR elevated, likely to fluid overload 4/21 nephrology started midodrine  10mg  before dialysis, BP appears to be doing a little better today 4/22- less hypotensive this AM laying- will monitor for [facility] 4/23- Tachycardia slightly better- in high 90's- having some intermittent elevated BP's- will monitor 4/24- BP running 140-s150s, but don't want to drop it since having HD 4/25- Having Hypotension with therapy, so don't want to reduce BP at all- doing TED's and ACE wraps- occ elevated, but with HD and therapy, not appropriate to reduce levels -06/08/23 BPs a little soft but HD yesterday; cont TEDs/ACE -06/09/23 BPs good, monitor  4/29-4/30 BP controlled- con't regimen Vitals:   06/10/23 1502 06/10/23 1530 06/10/23 1600 06/10/23 1637  BP: 139/87 (!) 145/80 128/75 125/80   06/10/23 1700 06/10/23 1813 06/10/23 1816 06/10/23 1920  BP: 125/73 138/82 123/75 138/87   06/11/23 0501 06/11/23 1404 06/11/23 1959 06/12/23 0517  BP: 121/74 135/78 135/77 116/80     12. Constipation: Small BM on 04/14 --  had of nulytey yesterday and another today? -Xray 4/17 without excessive stool, patient reports stool still little hard yesterday.  Will increase MiraLAX  to 3 times a day, continue Senokot twice a day. Suppository PRN   06/09/23 LBM yesterday, cont regimen  4/28- LBM overnight- has been refusing miralax - made it prn- taking metamucil  4/30- asking for Sorbitol  today since needs ot have bigger BM 13. ?left knee effusion. Not currently bothersome. Consider imaging.  14.  SIRS- Fever/hypotension/tachycardia - 4/21 Currently afebrile, he got to 50 cc IVF yesterday, CXR negative, EKG sinus tachycardia. Patient was started on Maxipime  and vancomycin  yesterday.  Appears to be doing much better today.  WBC not elevated at 7.1.  Continue to  follow blood cultures 4/22- Blood Cx's (-) so far- WBC 7.3- will con't IV ABX until know final results of blood Cx's- CXR (-)- but temp was 103.2 yesterday overnight.  4/23- Blood C'x (-) x2 days so far- T 99.9 this Am after a few sips of coffee- was 100.4 this AM- will con't IV ABX since concerned about possible bacteremia- esp since having low grade temps still 4/24- No more fevers, even low grade- no growth on blood Cx' bu it says that didn't get enough blood, so much be falsely negative 4/25- No growth for 5 days, but it also said not enough blood to be sure was a true negative. Will change stop date til tomorrow -06/08/23 at 3am temp recorded as 100.3 but 5am was 98.3; will get CBC w/diff, monitor temps closely today, if continued low grade temps, will reach out to ID. BCx NGTD x5d. Didn't get vanc yesterday? Or cefepime ? Unclear why. Cefepime  started this morning at 0011. -06/09/23 temp 100.3 at 3am yesterday and then 100 later in the day, but no fevers since, no tachycardia.  -WBC yesterday 8.9.  -Now with some confusion; spoke with Dr. Levern Reader of ID, stated cefepime  could be the cause of the confusion -recommended we stop all abx (which ended yesterday anyway) -watch the patient OFF abx -if fevers >100.3 occur then she recommended repeat BCx and CXR and could call her back to see if any abx would be advised at that time.  -Head CT and dopplers ordered as well, as above 4/28- CT looks good- no fever, WBC is OK- off ABX 4/29- Also Dopplers (-) feeling good this AM 15. Tachycardia 4/22- doing better- right at 100 this AM- will monitor- likely can be from dregs of infection vs OH. -will monitor  4/23- Tachycardia improving- 4/24-27 HR in 90's right now- doing better  4/29- 4/30- HR running in 90s-low 100's today 16. R hand swelling  4/23- doing ice- will do xrays 4/24- Xray doesn't look like cause of swelling, however could be due to new shunt? 4/30- resolved 17. Hypocalcemia 4/24- his  Albumin  is 2.1, so probably ok, and phos coming down- will leave to renal- thank you for their help  18. Hyperkalemia 4/28- K+ 6.3 today- got hypoosmotic bath to reduce K+ per note from RN/HD nurse? Also has Na of 127- being addressed by HD/renal- Phos also 5.8- but again, getting HD today  19. R>L knee pain  4/29- will ask Ortho if they will tap his R knee- I have no problem doing injection of knee, however with so much fluid, I would rather Ortho tap him and then do steroid injection.   4/30- we added Prednisone  40 mg daily x 3 days- to see if helps knee pain   I spent a total of  43   minutes on total care today- >50% coordination of care- due to  D/w pt about his renvela  as well as nursing- called Dr Adonis Alamin about collar change- also listened to pt's concerns.     LOS: 13 days A FACE TO FACE EVALUATION WAS PERFORMED  Richardine Peppers 06/12/2023, 8:31 AM

## 2023-06-12 NOTE — Patient Care Conference (Addendum)
 Inpatient RehabilitationTeam Conference and Plan of Care Update Date: 06/11/2023   Time: 1137 am    Patient Name: Carl Batz Sr.      Medical Record Number: [id number]  Date of Birth: 1960-11-10 Sex: Male         Room/Bed: 4W17C/4W17C-01 Payor Info: Payor: Advertising copywriter MEDICARE / Plan: Sandford Croon DUAL COMPLETE / Product Type: *No Product type* /    Admit Date/Time:  05/30/2023  2:11 PM  Primary Diagnosis:  Spinal cord injury, cervical region, sequela [facility]  Hospital Problems: Principal Problem:   Spinal cord injury, cervical region, sequela (HCC) Active Problems:   Coping style affecting medical condition    Expected Discharge Date: Expected Discharge Date:  (pending)  Team Members Present: Physician leading conference: Dr. Celia Coles Social Worker Present: Adrianna Albee, LCSW Nurse Present: Jerene Monks, RN PT Present: Gita Lamb, PT OT Present: [name], OT     Current Status/Progress Goal Weekly Team Focus  Bowel/Bladder   Patient is continent of bowel, anuric of bladder   Maintain continence   Assess q shift/PRN    Swallow/Nutrition/ Hydration               ADL's   total A overall, +2 transfers, introduction to transfer board for transfers   Mod A, downgraded 4/28   transfers, ADL retraining, BUE FMC    Mobility   mod+2 for Stedy or Slideboard transfers, supervision power w/c mobility, mod assist bed mobility   downgraded goals to w/c level min assist transfers, mod assist sit <> stand, mod I PWC mobility, +2 gait for NMR  progressing transfers, balance retraining, NMR, education    Communication                Safety/Cognition/ Behavioral Observations              Pain   Patient denies pain or discomfort   Patient will remain pain free   Assess q shift and PRN    Skin   Skin is intact   Maintain skin  Assess q shift and PRN      Discharge Planning:  Family Conference last week to discuss discharge plan and  options for DC. Son looking for first floor apartment to move them to. Aware of 24/7 care needs. Have private duty list and SNF list to look into    Team Discussion: Patient was admitted post Corpectomy with traumatic C4 SCI and on Hemodialysis. Patient with pain, hypotension: medications adjusted by MD. Patient limited by left knee swelling, new confusion, tachycardia.  Patient on target to meet rehab goals: no, Patient requires total assistance with ADLs, needing +2 with transfers and introduction to slide board. Patient requires supervision with mobility using a power wheelchair. Goals for discharge are set for Mod A wheelchair level.   *See Care Plan and progress notes for long and short-term goals.   Revisions to Treatment Plan:  Downgraded goals Power wheelchair Slide board Family Conference Resting hand splint Built up handles for feeding and grooming C-Collar Head CT Dopplers W/C evaluation Ortho consult   Teaching Needs: Safety, medications, dietary modifications, safety, toileting and transfers, etc.     Current Barriers to Discharge: Decreased caregiver support, Home enviroment access/layout, and Hemodialysis  Possible Resolutions to Barriers: Family Education  SNF     Medical Summary Current Status: Continent of bowel- anuric- C4 ASIA C- spasms noted- but very few options for spasticity due to ESRD- family cxonference last week  Barriers to Discharge: Self-care education;Neurogenic Bowel &  Bladder;Medical stability;Renal Insufficiency/Failure;Spasticity;Weight bearing restrictions;Electrolyte abnormality  Barriers to Discharge Comments: limited by SCI- weakness due to C4;  Mild spasticity-cannot start Baclofen due to ESRD and try to avoid spasticity meds due to HD; Has had K+ 6.3 and needed different HD bath yesterday;  is still ASIA C SCI; using steady to transfer; started transfer board; Possible Resolutions to [employer] Focus: needs w/c eval- via [employer]-  because hopefully will be going home with needing w/c; d/c 3 more weeks   Continued Need for Acute Rehabilitation Level of Care: The patient requires daily medical management by a physician with specialized training in physical medicine and rehabilitation for the following reasons: Direction of a multidisciplinary physical rehabilitation program to maximize functional independence : Yes Medical management of patient stability for increased activity during participation in an intensive rehabilitation regime.: Yes Analysis of laboratory values and/or radiology reports with any subsequent need for medication adjustment and/or medical intervention. : Yes   I attest that I was present, lead the team conference, and concur with the assessment and plan of the team.   Jerene Monks 06/11/2023, 1137 am

## 2023-06-12 NOTE — Plan of Care (Signed)
  Problem: SCI BOWEL ELIMINATION Goal: RH STG MANAGE BOWEL WITH ASSISTANCE Description: STG Manage Bowel with minimal Assistance. Outcome: Progressing Goal: RH STG SCI MANAGE BOWEL WITH MEDICATION WITH ASSISTANCE Description: STG SCI Manage bowel with medication with assistance. Outcome: Progressing   Problem: RH SKIN INTEGRITY Goal: RH STG SKIN FREE OF INFECTION/BREAKDOWN Description: Manage skin free of infection/breakdown with minimal assistance Outcome: Progressing   Problem: RH SAFETY Goal: RH STG ADHERE TO SAFETY PRECAUTIONS W/ASSISTANCE/DEVICE Description: STG Adhere to Safety Precautions With minimal Assistance/Device. Outcome: Progressing   Problem: RH PAIN MANAGEMENT Goal: RH STG PAIN MANAGED AT OR BELOW PT'S PAIN GOAL Description: Pain<4 w/ prns Outcome: Progressing   Problem: RH KNOWLEDGE DEFICIT SCI Goal: RH STG INCREASE KNOWLEDGE OF SELF CARE AFTER SCI Description: Manage increase knowledge of self care after SCI with minimal assistance using educational materials provided to son Outcome: Progressing

## 2023-06-12 NOTE — Progress Notes (Signed)
   06/12/23 1857  Vitals  Temp 98.4 F (36.9 C)  Pulse Rate 98  Resp 18  BP 110/60  SpO2 96 %  O2 Device Room Air  Weight (S)  89.9 kg (Bed Scale)  Type of Weight Post-Dialysis  Post Treatment  Dialyzer Clearance Clear  Hemodialysis Intake (mL) 0 mL  Liters Processed 81.4  Fluid Removed (mL) 2500 mL  Tolerated HD Treatment Yes  Post-Hemodialysis Comments Tx completed without difficulties. Pt. voice no complaints and report call to 4W Beside Nurse. Admin Medication per.order.  AVG/AVF Arterial Site Held (minutes) 5 minutes  AVG/AVF Venous Site Held (minutes) 5 minutes   Received patient in bed to unit.  Alert and oriented.  Informed consent signed and in chart.   TX duration: 3.5  Patient tolerated well.  Transported back to the room  Alert, without acute distress.  Hand-off given to patient's nurse.   Access used: Yes Access issues: No  Total UF removed: 2500 Medication(s) given: See MAR Post HD VS: See Above Grid Post HD weight: 89.9 kg   [facility]

## 2023-06-13 DIAGNOSIS — R41841 Cognitive communication deficit: Secondary | ICD-10-CM

## 2023-06-13 MED ORDER — CHLORHEXIDINE GLUCONATE CLOTH 2 % EX PADS
6.0000 | MEDICATED_PAD | Freq: Every day | CUTANEOUS | Status: DC
  Administered 2023-06-14 – 2023-06-19 (×5): 6 via TOPICAL

## 2023-06-13 NOTE — Progress Notes (Signed)
 Occupational Therapy Session Note  Patient Details  Name: Carl Lanfear Sr. MRN: [id number] Date of Birth: 10/25/1960  Today's Date: 06/13/2023 OT Individual Time: 0915-1000 OT Individual Time Calculation (min): 45 min    Short Term Goals: Week 2:  OT Short Term Goal 1 (Week 2): Pt will complete bed mobility at Mod A in preparation for LB dressing at bed level OT Short Term Goal 2 (Week 2): Pt will complete self feeding at Min A with LUE with AE as necessary OT Short Term Goal 3 (Week 2): Pt will complete UB dressing at Mod A  Skilled Therapeutic Interventions/Progress Updates:    PT resting in PWC upon arrival. Skilled OT intervention with focus on PWC mobility and sitting balance with reaching outside BOS to increase independence with bADLs. W/c adjustement to control panel to prevent from slipping under arm rest. Reaching tasks removing and replacing careds on card board. Pt required UE support to reach cards but able to maintain sitting balance when leaning forward. PWC mobility throughout unit and treatment areas with supervision. No unsafe behaviot noted. Pt remained in w/c with all needs within reach.   Therapy Documentation Precautions:  Precautions Precautions: Fall, Cervical, Other (comment) (HD MWF (RUE restricted).) Recall of Precautions/Restrictions: Impaired Precaution/Restrictions Comments: Verbally reviewed cervical precautions. Required Braces or Orthoses: Cervical Brace Cervical Brace: Hard collar Restrictions Weight Bearing Restrictions Per Provider Order: No   Pain: Pt denies pain this monring.  Therapy/Group: Individual Therapy  [name] 06/13/2023, 12:09 PM

## 2023-06-13 NOTE — Progress Notes (Signed)
 Physical Therapy Session Note  Patient Details  Name: Carl Asman Sr. MRN: [id number] Date of Birth: July 27, 1960  Today's Date: 06/13/2023 PT Individual Time: 1300-1400 PT Individual Time Calculation (min): 60 min   Short Term Goals: Week 2:  PT Short Term Goal 1 (Week 2): Pt will progress transfers to mod assist PT Short Term Goal 2 (Week 2): Pt will tolerate standing with lift equipment x 5 min preparation for gait PT Short Term Goal 3 (Week 2): Pt will be able to perform bed mobilty with min assist for supine <> sit  Skilled Therapeutic Interventions/Progress Updates:    Session focused on NMR during gait and sit <> stands using Blanca Bunch and Lite Gait. Initially donned harness in seated position and then stood from power w/c (elevated) but sling was not in correct position. Used Eva walker for sit > stand with Mod +2 assist (elevating height as pt standing) with focus on anterior weightshift and cues for hip and trunk extension in standing while PT and rec therapist adjusted harness in standing. Using Lite Gait and Marlea Silvers walker with +2 focused on NMR during gait training about 110' with multiple standing rest breaks - cues for posture throughout (pt able to achieve upright posture at times but difficulty sustaining it), attempted standing without UE support x 2 trials with harness supporting moderately, and reciprocal movement pattern retraining with pt able to advance BLE independently. Pt does demonstrate decreased knee extension on the R. End of session returned to power w/c and independent with mobility back to room.  BP = 115/75 mmHg; HR = 101 bpm - asymptomatic  Therapy Documentation Precautions:  Precautions Precautions: Fall, Cervical, Other (comment) (HD MWF (RUE restricted).) Recall of Precautions/Restrictions: Impaired Precaution/Restrictions Comments: Verbally reviewed cervical precautions. Required Braces or Orthoses: Cervical Brace Cervical Brace: Hard  collar Restrictions Weight Bearing Restrictions Per Provider Order: No  Pain:  Denies pain.    Therapy/Group: Individual Therapy and Co-Treatment with Recreational Therapy  Gita Lamb Amadeo June, PT, DPT, CBIS  06/13/2023, 3:31 PM

## 2023-06-13 NOTE — Progress Notes (Signed)
   06/13/23 1200  Spiritual Encounters  Type of Visit Initial  Care provided to: Patient  Reason for visit Advance directives    Chaplain responded to consult request for HCPOA. Patient stated that he wants to his son be present during the education of the document. Chaplain let RN know about the patient's request. RN will call chaplains' office when his son arrived.    M.Kubra Welby Hale Resident [phone]

## 2023-06-13 NOTE — Progress Notes (Signed)
 Orthopedic Tech Progress Note Patient Details:  Carl Bracci Sr. [birth year] [id number]  Ortho Devices Type of Ortho Device: Soft collar Ortho Device/Splint Location: Neck Ortho Device/Splint Interventions: Ordered   Post Interventions Patient Tolerated: Well  [name] [name] 06/13/2023, 9:19 AM

## 2023-06-13 NOTE — Progress Notes (Signed)
 [facility] Progress Note   Subjective:   Seen in room, seated in chair. Alert, talkative, no new concerns.   Objective Vitals:   06/12/23 1857 06/12/23 2050 06/13/23 0501 06/13/23 0506  BP: 110/60 118/73 136/78   Pulse: 98 (!) 106 86   Resp: 18 18 18    Temp: 98.4 F (36.9 C) 98 F (36.7 C) 99 F (37.2 C)   TempSrc:   Oral   SpO2: 96% 100% 100%   Weight: (S) 89.9 kg   89.9 kg  Height:       Physical Exam General: Alert male in NAD Heart: RRR, no murmurs Lungs: CTA bilaterally, respirations unlabored Abdomen: soft, non-distended, +BS Extremities: Trace edema b/l lower extremities Dialysis Access: RUE AVF +T/b  Additional Objective Labs: Basic Metabolic Panel: Recent Labs  Lab 06/10/23 1416 06/12/23 1516  NA 127* 129*  K 6.3* 5.6*  CL 90* 91*  CO2 20* 22  GLUCOSE 133* 121*  BUN 97* 88*  CREATININE 12.41* 9.88*  CALCIUM  9.1 9.3  PHOS 5.8*  --    Liver Function Tests: Recent Labs  Lab 06/10/23 1416  ALBUMIN  2.0*   No results for input(s): "LIPASE", "AMYLASE" in the last 168 hours. CBC: Recent Labs  Lab 06/08/23 1248 06/10/23 1416 06/12/23 1516  WBC 8.9 9.3 6.3  NEUTROABS 6.2  --  4.4  HGB 8.5* 7.7* 7.3*  HCT 26.2* 24.5* 22.6*  MCV 88.8 90.4 90.0  PLT 301 325 370   Blood Culture    Component Value Date/Time   SDES BLOOD LEFT HAND 06/02/2023 1743   SDES BLOOD LEFT HAND 06/02/2023 1743   SPECREQUEST  06/02/2023 1743    BOTTLES DRAWN AEROBIC AND ANAEROBIC Blood Culture results may not be optimal due to an inadequate volume of blood received in culture bottles   SPECREQUEST  06/02/2023 1743    AEROBIC BOTTLE ONLY Blood Culture results may not be optimal due to an inadequate volume of blood received in culture bottles   CULT  06/02/2023 1743    NO GROWTH 5 DAYS Performed at [facility], [address], [place], [place] [address]    CULT  06/02/2023 1743    NO GROWTH 5 DAYS Performed at [facility], [address] [place], [place] [address]    REPTSTATUS 06/07/2023 FINAL 06/02/2023 1743   REPTSTATUS 06/07/2023 FINAL 06/02/2023 1743    Cardiac Enzymes: No results for input(s): "CKTOTAL", "CKMB", "CKMBINDEX", "TROPONINI" in the last 168 hours. CBG: Recent Labs  Lab 06/10/23 1149 06/10/23 2037 06/11/23 0504 06/11/23 1127 06/11/23 1614  GLUCAP 103* 143* 91 107* 120*   Iron Studies: No results for input(s): "IRON", "TIBC", "TRANSFERRIN", "FERRITIN" in the last 72 hours. @lablastinr3 @ Studies/Results: DG Knee Complete 4 Views Right Result Date: 06/11/2023 CLINICAL DATA:  Chronic bilateral knee pain and swelling. EXAM: RIGHT KNEE - COMPLETE 4+ VIEW; LEFT KNEE - COMPLETE 4+ VIEW COMPARISON:  Right knee radiographs dated 12/26/2021. FINDINGS: Right knee: No acute fracture or dislocation. There is a small to moderate-sized joint effusion. Moderate tricompartmental degenerative changes with joint space narrowing and marginal osteophytosis. Vascular calcifications are noted. Left knee: No acute fracture or dislocation. There is a small joint effusion. Moderate tricompartmental degenerative changes with joint space narrowing and marginal osteophytosis. Vascular calcifications are noted. IMPRESSION: 1. No acute osseous abnormality. 2. 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 Electronically Signed   By: Mannie Seek M.D.   On: 06/11/2023  17:11   DG Knee Complete 4 Views Left Result Date: 06/11/2023 CLINICAL DATA:  Chronic bilateral knee pain and swelling. EXAM: RIGHT KNEE - COMPLETE 4+ VIEW; LEFT KNEE - COMPLETE 4+ VIEW COMPARISON:  Right knee radiographs dated 12/26/2021. FINDINGS: Right knee: No acute fracture or dislocation. There is a small to moderate-sized joint effusion. Moderate tricompartmental degenerative changes with joint space narrowing and marginal osteophytosis. Vascular calcifications are noted. Left knee: No acute  fracture or dislocation. There is a small joint effusion. Moderate tricompartmental degenerative changes with joint space narrowing and marginal osteophytosis. Vascular calcifications are noted. IMPRESSION: 1. No acute osseous abnormality. 2. 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 Electronically Signed   By: Mannie Seek M.D.   On: 06/11/2023 17:11   Medications:   (feeding supplement) PROSource Plus  30 mL Oral BID BM   atorvastatin   10 mg Oral Q Thu   carbamide peroxide  5 drop Right EAR BID   Chlorhexidine  Gluconate Cloth  6 each Topical Q0600   Chlorhexidine  Gluconate Cloth  6 each Topical Q0600   cinacalcet   120 mg Oral Q supper   darbepoetin (ARANESP ) injection - DIALYSIS  150 mcg Subcutaneous Q Tue-1800   gabapentin   300 mg Oral QHS   Gerhardt's butt cream   Topical BID   pantoprazole   40 mg Oral BID AC   predniSONE   40 mg Oral Q supper   psyllium  1 packet Oral TID   senna-docusate  2 tablet Oral BID   sevelamer  carbonate  3,200 mg Oral TID WC   topiramate   25 mg Oral BID    Dialysis Orders: OP Dialysis Orders [facility] - MWF 4h  B400   85.5kg   2K bath  AVF   - Heparin  3000 units IV three times per week  - Mircera 60mcg IV q 2 weeks- last dose 05/24/23  - Calcitriol  1.5mcg PO q HD  Assessment/Plan:  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 Now in CIR ESRD: Off schedule last week, has resumed MWF schedule, K+ running high, reinforce low K_ diet.  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

## 2023-06-13 NOTE — Progress Notes (Signed)
 PROGRESS NOTE   Subjective/Complaints:  Pt reports doing OK.  Was able ot feed self with BOTH hands this Am- with built up utensils.  Took sorbitol  this AM since last BM was small- yesterday early AM.   Has no other complaints- Feels good otherwise   ROS:    Pt denies SOB, abd pain, CP, N/V/C/D, and vision changes  Objective:   DG Knee Complete 4 Views Right Result Date: 06/11/2023 CLINICAL DATA:  Chronic bilateral knee pain and swelling. EXAM: RIGHT KNEE - COMPLETE 4+ VIEW; LEFT KNEE - COMPLETE 4+ VIEW COMPARISON:  Right knee radiographs dated 12/26/2021. FINDINGS: Right knee: No acute fracture or dislocation. There is a small to moderate-sized joint effusion. Moderate tricompartmental degenerative changes with joint space narrowing and marginal osteophytosis. Vascular calcifications are noted. Left knee: No acute fracture or dislocation. There is a small joint effusion. Moderate tricompartmental degenerative changes with joint space narrowing and marginal osteophytosis. Vascular calcifications are noted. IMPRESSION: 1. No acute osseous abnormality. 2. 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 Electronically Signed   By: Mannie Seek M.D.   On: 06/11/2023 17:11   DG Knee Complete 4 Views Left Result Date: 06/11/2023 CLINICAL DATA:  Chronic bilateral knee pain and swelling. EXAM: RIGHT KNEE - COMPLETE 4+ VIEW; LEFT KNEE - COMPLETE 4+ VIEW COMPARISON:  Right knee radiographs dated 12/26/2021. FINDINGS: Right knee: No acute fracture or dislocation. There is a small to moderate-sized joint effusion. Moderate tricompartmental degenerative changes with joint space narrowing and marginal osteophytosis. Vascular calcifications are noted. Left knee: No acute fracture or dislocation. There is a small joint effusion. Moderate tricompartmental degenerative changes  with joint space narrowing and marginal osteophytosis. Vascular calcifications are noted. IMPRESSION: 1. No acute osseous abnormality. 2. 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 Electronically Signed   By: Mannie Seek M.D.   On: 06/11/2023 17:11      Recent Labs    06/10/23 1416 06/12/23 1516  WBC 9.3 6.3  HGB 7.7* 7.3*  HCT 24.5* 22.6*  PLT 325 370    Recent Labs    06/10/23 1416 06/12/23 1516  NA 127* 129*  K 6.3* 5.6*  CL 90* 91*  CO2 20* 22  GLUCOSE 133* 121*  BUN 97* 88*  CREATININE 12.41* 9.88*  CALCIUM  9.1 9.3     Intake/Output Summary (Last 24 hours) at 06/13/2023 0846 Last data filed at 06/13/2023 0750 Gross per 24 hour  Intake 474 ml  Output 2500 ml  Net -2026 ml        Physical Exam: Vital Signs Blood pressure 136/78, pulse 86, temperature 99 F (37.2 C), temperature source Oral, resp. rate 18, height 6\' 1"  (1.854 m), weight 89.9 kg, SpO2 100%.        General: awake, alert, appropriate, sitting EOB, finished breakfast; NAD HENT: conjugate gaze; oropharynx moist- wearing collar CV: regular rate and rhythm; no JVD Pulmonary: CTA B/L; no W/R/R- good air movement GI: soft, NT, ND, (+)BS- normoactive Psychiatric: appropriate, bright affect this AM Neurological: Ox3  MsK: B/L knees with effusions- less TTP on R knee this  AM , no erythema, not really focally warm to touch- Both have effusions, - R hand swelling looks good/resolved Skin: no evidence of cellulitis to extremities.   PRIOR EXAMS: Neurological: Ox3  MSK- R hand swelling esp ulnar side of hand/dorsum and L wrist-stable this AM RUE- Biceps 4/5; WE 4-/5; Triceps 2/5; Grip 2-/5 and FA 1/5 LUE- 4/5 except triceps 3+ to 4-/5 RLE- HF 1 to 2-/5; KE 2-/5; DF/PF 2-/5 LLE- HF 3/5; KE 4-/5; DF/PF 4/5    Skin: C/D/I. Multiple lesions on BL legs with various stages of healing.  + RUE fistula - intact   MSK:       LLE knee with palpable effusion, no warmth, no ttp Mood: Pleasant affect, appropriate mood.  Neurologic exam: Alert and oriented x 4, fluent, follows commands, cranial nerves II through XII grossly intact, no hypertonia noted  Sensation: Reduced to light touch in distal right lower extremity to the mid-thigh       Strength:                RUE: 2/5 SA, 2/5 EF, 2/5 EE, 2/5 WE, 2/5 FF, 2/5 FA                LUE:  3/5 SA, 3/5 EF, 3/5 EE, 3/5 WE, 3/5 FF, 3/5 FA                RLE: 2/5 HF, 2/5 KE, 2/5  DF, 2/5  EHL, 2/5  PF                 LLE:  3/5 HF, 4/5 KE, 4/5  DF, 4/5  EHL, 4/5  PF  Neuroexam stable 4/21  Assessment/Plan: 1. Functional deficits which require 3+ hours per day of interdisciplinary therapy in a comprehensive inpatient rehab setting. Physiatrist is providing close team supervision and 24 hour management of active medical problems listed below. Physiatrist and rehab team continue to assess barriers to discharge/monitor patient progress toward functional and medical goals  Care Tool:  Bathing    Body parts bathed by patient: Right arm, Chest, Abdomen, Right upper leg, Left upper leg, Face   Body parts bathed by helper: Left arm, Front perineal area, Buttocks, Right lower leg, Left lower leg     Bathing assist Assist Level: Moderate Assistance - Patient 50 - 74%     Upper Body Dressing/Undressing Upper body dressing   What is the patient wearing?: Pull over shirt    Upper body assist Assist Level: Moderate Assistance - Patient 50 - 74%    Lower Body Dressing/Undressing Lower body dressing      What is the patient wearing?: Pants     Lower body assist Assist for lower body dressing: Maximal Assistance - Patient 25 - 49%     Toileting Toileting Toileting Activity did not occur (Clothing management and hygiene only): N/A (no void or bm) (anuric, HD patient)  Toileting assist Assist for toileting: Maximal Assistance - Patient 25 - 49%      Transfers Chair/bed transfer  Transfers assist     Chair/bed transfer assist level: Moderate Assistance - Patient 50 - 74% (slideboard)     Locomotion Ambulation   Ambulation assist   Ambulation activity did not occur: Safety/medical concerns          Walk 10 feet activity   Assist  Walk 10 feet activity did not occur: Safety/medical concerns        Walk 50 feet activity   Assist Walk 50 feet  with 2 turns activity did not occur: Safety/medical concerns         Walk 150 feet activity   Assist Walk 150 feet activity did not occur: Safety/medical concerns         Walk 10 feet on uneven surface  activity   Assist Walk 10 feet on uneven surfaces activity did not occur: Safety/medical concerns         Wheelchair     Assist Is the patient using a wheelchair?: Yes Type of Wheelchair: Power    Wheelchair assist level: Supervision/Verbal cueing Max wheelchair distance: 200+    Wheelchair 50 feet with 2 turns activity    Assist        Assist Level: Supervision/Verbal cueing   Wheelchair 150 feet activity     Assist      Assist Level: Supervision/Verbal cueing   Blood pressure 136/78, pulse 86, temperature 99 F (37.2 C), temperature source Oral, resp. rate 18, height 6\' 1"  (1.854 m), weight 89.9 kg, SpO2 100%.  Medical Problem List and Plan: 1. 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  Antithrombotics: -DVT/anticoagulation:  Mechanical: Sequential compression devices, below knee Bilateral lower extremities             -antiplatelet therapy: N/A--no antiplatelet due to hx of recurrent GIB  4/25- d/w family- they agree that Lovenox  not appropriate at this time.   -06/09/23 ordered dopplers to r/o clots given low grade temps recently  4/28- LE Dopplers (-) for DVT 3. Pain Management:  oxycodone  prn. Gabapentin  300 mg/hs.              --has Flexeril  10 mg TID prn              - patient with intermittent, severe b/l headache - add topomax 25 mg BID    4/19- pt rating pain 07/19/08 which is "tolerable"  4/22- pain slightly better this AM  4/25- fewer complaints of pain -06/09/23 having more b/l knee pain and using percocets more-- could be causing confusion? Advised judicious use, might benefit from knee aspirations/injections? 4/28- less knee pain this AM 4/30- pain about the same- just started Prednisone  yesterday per Ortho Steffanie Edouard request 5/1- fewer complaints about knee pain 4. Mood/Behavior/Sleep: LCSW to follow for evaluation and support             -antipsychotic agents: N/A             --melatonin prn for insomnia.  -06/09/23 poor sleep, d/t interruptions, asked nursing to minimize interruptions between 11p-5a (apparently they can't put a sign up, but will communicate in huddle) 5. Neuropsych/cognition: This patient is capable of making decisions on his own behalf. -06/09/23 son concerned with confusion, could be from poor sleep/pain meds vs cefepime  toxicity; getting head CT, dopplers, consulted ID  Dr. Levern Reader as below 4/28- cognitively intact this AM 4/30- 5/1-doing well cognitively and brighter affect 6. Skin/Wound Care: Routine pressure relief measures.   4/19- due to ulcers on legs, on low air loss mattress 7. Fluids/Electrolytes/Nutrition: Strict I/O. Daily weights. Labs with HD. 8. ESRD- HD MWF: Schedule HD at the end of the day to help with  tolerance of therapy             --Sensipar  and Renvela  for metabolic bone disease.   - Patient reports no longer makes urine  4/20- pt getting HD this AM- last HD was Wednesday.   4/21 dialysis moved to tomorrow  4/22- HD to be done today  4/25- Next HD today to get back on track  4/28- HD today  4/30- supposed to have HD today- will ask they recheck labs  5/1- got HD yesterday 9. Anemia of chronic disease: Monitor H/H, aranesp   -4/21 HGB lower 7.5, FOBT, recheck tomorrow  4/22- HB 8.7 on today's check-con't to monitor  -06/09/23 hgb 8.5 yesterday  4/28- Hb down to 7.7- 4/29- will try to recheck Thursday  4/30- if they don't check in HD, will check tomorrow 5/1- Hb down to 7.3- is Jehov[employer]'s Witness, so doesn't want transfusions 10. H/o recurrent GIB/Jehovah's Witness: Continue Protonix  BID 11. H/o Hypotension: Has had drop  in BP with HD. Now cervical SCI.  --Monitor for symptoms day after HD and with activity. -4/18 baseline blood pressure stable, monitor with activity 4/19- BP actually slightly elevated today- 140s-150's- con't regimen, since can drop with HD and therapy 4/20- BP actually doing better today- in 100s'110s- however HR elevated, likely to fluid overload 4/21 nephrology started midodrine  10mg  before dialysis, BP appears to be doing a little better today 4/22- less hypotensive this AM laying- will monitor for [facility] 4/23- Tachycardia slightly better- in high 90's- having some intermittent elevated BP's- will monitor 4/24- BP running 140-s150s, but don't want to drop it since having HD 4/25- Having Hypotension with therapy, so don't want to reduce BP at all- doing TED's and ACE wraps- occ elevated, but with HD and therapy, not appropriate to reduce levels -06/08/23 BPs a little soft but HD yesterday; cont TEDs/ACE -06/09/23 BPs good, monitor  4/29-5/1-  BP controlled- con't regimen Vitals:   06/11/23 1959 06/12/23 0517 06/12/23 1515 06/12/23 1527  BP: 135/77 116/80 139/83 (!)  140/83   06/12/23 1557 06/12/23 1635 06/12/23 1709 06/12/23 1730  BP: 131/78 119/74 122/76 123/71   06/12/23 1805 06/12/23 1857 06/12/23 2050 06/13/23 0501  BP: 120/75 110/60 118/73 136/78     12. Constipation: Small BM on 04/14 --had of nulytey yesterday and another today? -Xray 4/17 without excessive stool, patient reports stool still little hard yesterday.  Will increase MiraLAX  to 3 times a day, continue Senokot twice a day. Suppository PRN   06/09/23 LBM yesterday, cont regimen  4/28- LBM overnight- has been refusing miralax - made it prn- taking metamucil  4/30- asking for Sorbitol  today since needs ot have bigger BM  5/1- ended up taking sorbitol  this AM- no result quite yet 13. ?left knee effusion. Not currently bothersome. Consider imaging.  14.  SIRS- Fever/hypotension/tachycardia - 4/21 Currently afebrile, he got to 50 cc IVF yesterday, CXR negative, EKG sinus tachycardia. Patient was started on Maxipime  and vancomycin  yesterday.  Appears to be doing much better today.  WBC not elevated at 7.1.  Continue to follow blood cultures 4/22- Blood Cx's (-) so far- WBC 7.3- will con't IV ABX until  know final results of blood Cx's- CXR (-)- but temp was 103.2 yesterday overnight.  4/23- Blood C'x (-) x2 days so far- T 99.9 this Am after a few sips of coffee- was 100.4 this AM- will con't IV ABX since concerned about possible bacteremia- esp since having low grade temps still 4/24- No more fevers, even low grade- no growth on blood Cx' bu it says that didn't get enough blood, so much be falsely negative 4/25- No growth for 5 days, but it also said not enough blood to be sure was a true negative. Will change stop date til tomorrow -06/08/23 at 3am temp recorded as 100.3 but 5am was 98.3; will get CBC w/diff, monitor temps closely today, if continued low grade temps, will reach out to ID. BCx NGTD x5d. Didn't get vanc yesterday? Or cefepime ? Unclear why. Cefepime  started this morning at  0011. -06/09/23 temp 100.3 at 3am yesterday and then 100 later in the day, but no fevers since, no tachycardia.  -WBC yesterday 8.9.  -Now with some confusion; spoke with Dr. Levern Reader of ID, stated cefepime  could be the cause of the confusion -recommended we stop all abx (which ended yesterday anyway) -watch the patient OFF abx -if fevers >100.3 occur then she recommended repeat BCx and CXR and could call her back to see if any abx would be advised at that time.  -Head CT and dopplers ordered as well, as above 4/28- CT looks good- no fever, WBC is OK- off ABX 4/29- Also Dopplers (-) feeling good this AM 15. Tachycardia 4/22- doing better- right at 100 this AM- will monitor- likely can be from dregs of infection vs OH. -will monitor  4/23- Tachycardia improving- 4/24-27 HR in 90's right now- doing better  4/29- 4/30- HR running in 90s-low 100's today 16. R hand swelling  4/23- doing ice- will do xrays 4/24- Xray doesn't look like cause of swelling, however could be due to new shunt? 4/30- resolved 17. Hypocalcemia 4/24- his Albumin  is 2.1, so probably ok, and phos coming down- will leave to renal- thank you for their help  5/1- Ca 9.3 18. Hyperkalemia 4/28- K+ 6.3 today- got hypoosmotic bath to reduce K+ per note from RN/HD nurse? Also has Na of 127- being addressed by HD/renal- Phos also 5.8- but again, getting HD today 5/1- K+ down to 5.6 and Na 129 19. R>L knee pain  4/29- will ask Ortho if they will tap his R knee- I have no problem doing injection of knee, however with so much fluid, I would rather Ortho tap him and then do steroid injection.   4/30- we added Prednisone  40 mg daily x 3 days- to see if helps knee pain  5/1- mild reduction in pain so far  I spent a total of 36   minutes on total care today- >50% coordination of care- due to  D/w pt, review of labs; including Hb, K+, and Na- and Ca, as well as review of bowels and B/L knee pain    LOS: 14 days A FACE TO FACE  EVALUATION WAS PERFORMED  Jashanti Clinkscale 06/13/2023, 8:46 AM

## 2023-06-13 NOTE — Progress Notes (Signed)
 Physical Therapy Session Note  Patient Details  Name: [name]. MRN: [id number] Date of Birth: [birth year]  Today's Date: 06/13/2023 PT Individual Time: 1100-1200 PT Individual Time Calculation (min): 60 min   Short Term Goals: Week 2:  PT Short Term Goal 1 (Week 2): Pt will progress transfers to mod assist PT Short Term Goal 2 (Week 2): Pt will tolerate standing with lift equipment x 5 min preparation for gait PT Short Term Goal 3 (Week 2): Pt will be able to perform bed mobilty with min assist for supine <> sit  Skilled Therapeutic Interventions/Progress Updates:    Pt recd as hand off from previous PT seated on mat table in gym. First part of session focused on core strength and endurance for improved functional mobility. Pt participated in x 3 bouts of circuit including: sit ups to red wedge with 6lb ball, oblique crunches with 6lb ball, reclined marches for lower abdominals. Pt then directed to perform tricep pushups against mat, able to achieve isometric in <full range with extensive education and cueing that <full motion is still achieving something and worth attempting. Transitioned to pushups on blue bench placed anteriorly for improved ROM and pt perceived success with activity. Transitioned to standing practice for neuromuscular return and advancement of functional mobility. Pt was able to stand to eva walker with 2 helpers to manage device and min a with eva walker elevating during stand to assist. Pt performed standing marches and mini squats in this manner. Cues and education for hip extension, knee extension, and maintaining  upright posture. Pt appears to have tightness in anterior hip, so educated on lying flat in bed if able to manage. Pt performed Stand pivot transfer with min a x 2 to manage eva walker and for stability.  Pt returned to room and remained in chair with needs in reach.   Therapy Documentation Precautions:  Precautions Precautions: Fall, Cervical, Other  (comment) (HD MWF (RUE restricted).) Recall of Precautions/Restrictions: Impaired Precaution/Restrictions Comments: Verbally reviewed cervical precautions. Required Braces or Orthoses: Cervical Brace Cervical Brace: Hard collar Restrictions Weight Bearing Restrictions Per Provider Order: No General:       Therapy/Group: Individual Therapy  Tex Filbert 06/13/2023, 4:02 PM

## 2023-06-13 NOTE — Progress Notes (Signed)
 Physical Therapy Session Note  Patient Details  Name: Carl James Sr. MRN: [id number] Date of Birth: 06-Jun-1960  Today's Date: 06/13/2023 PT Individual Time: 1035-1100 PT Individual Time Calculation (min): 25 min   Short Term Goals: Week 2:  PT Short Term Goal 1 (Week 2): Pt will progress transfers to mod assist PT Short Term Goal 2 (Week 2): Pt will tolerate standing with lift equipment x 5 min preparation for gait PT Short Term Goal 3 (Week 2): Pt will be able to perform bed mobilty with min assist for supine <> sit  Skilled Therapeutic Interventions/Progress Updates:    Pt presents  in PWC, PT modified set up for better alignment (using pool noodle, seat belt, and realigning hips). Mod I for PWC mobility on unit including setting up w/c next to mat for transfer. Focused on pt attempting all parts of prep for transfer including setting up w/c, directing care, etc. Pt able to do everything except for removing R hip abduction pad, flipping up foot plates, and placing slideboard! Pt required extra time and questioning cues for sequence of all items. Pt performed CGA transfer form w/c to the mat table and removed slideboard himself. Dynamic sitting balance EOM to adjust sock himself with PT assisting with positioning RLE in figure four (requires assist to get in and maintain position) but pt able to to with extra time manipulate sock himself. Handoff to next PT.   Therapy Documentation Precautions:  Precautions Precautions: Fall, Cervical, Other (comment) (HD MWF (RUE restricted).) Recall of Precautions/Restrictions: Impaired Precaution/Restrictions Comments: Verbally reviewed cervical precautions. Required Braces or Orthoses: Cervical Brace Cervical Brace: Hard collar Restrictions Weight Bearing Restrictions Per Provider Order: No   Pain: Pain Assessment Pain Scale: 0-10 Pain Score: 0-No pain     Therapy/Group: Individual Therapy  Gita Lamb Amadeo June, PT, DPT,  CBIS  06/13/2023, 12:13 PM

## 2023-06-13 NOTE — Progress Notes (Signed)
 Recreational Therapy Session Note  Patient Details  Name: Carl Dalpe Sr. MRN: [id number] Date of Birth: Aug 11, 1960 Today's Date: 06/13/2023  Pain: no c/o Skilled Therapeutic Interventions/Progress Updates: Session focused on activity tolerance & ambulation during co-treat with PT.  Pt ambulated with Lite Gait and Eva walker with +2 assist and instructional cues throughout for posture.  Pt ambulated ~110" with standing rest breaks.  .Discussed leisure interests throughout this session  Therapy/Group: Co-Treatment  Noami Bove 06/13/2023, 3:46 PM

## 2023-06-14 LAB — RENAL FUNCTION PANEL
Albumin: 2.5 g/dL — ABNORMAL LOW (ref 3.5–5.0)
Anion gap: 18 — ABNORMAL HIGH (ref 5–15)
BUN: 88 mg/dL — ABNORMAL HIGH (ref 8–23)
CO2: 22 mmol/L (ref 22–32)
Calcium: 9.7 mg/dL (ref 8.9–10.3)
Chloride: 92 mmol/L — ABNORMAL LOW (ref 98–111)
Creatinine, Ser: 8.6 mg/dL — ABNORMAL HIGH (ref 0.61–1.24)
GFR, Estimated: 6 mL/min — ABNORMAL LOW (ref >60)
Glucose, Bld: 105 mg/dL — ABNORMAL HIGH (ref 70–99)
Phosphorus: 4.4 mg/dL (ref 2.5–4.6)
Potassium: 5.2 mmol/L — ABNORMAL HIGH (ref 3.5–5.1)
Sodium: 132 mmol/L — ABNORMAL LOW (ref 135–145)

## 2023-06-14 LAB — CBC
HCT: 23.6 % — ABNORMAL LOW (ref 39.0–52.0)
Hemoglobin: 7.5 g/dL — ABNORMAL LOW (ref 13.0–17.0)
MCH: 28.6 pg (ref 26.0–34.0)
MCHC: 31.8 g/dL (ref 30.0–36.0)
MCV: 90.1 fL (ref 80.0–100.0)
Platelets: 436 10*3/uL — ABNORMAL HIGH (ref 150–400)
RBC: 2.62 MIL/uL — ABNORMAL LOW (ref 4.22–5.81)
RDW: 15.2 % (ref 11.5–15.5)
WBC: 8 10*3/uL (ref 4.0–10.5)
nRBC: 0 % (ref 0.0–0.2)

## 2023-06-14 MED ORDER — HEPARIN SODIUM (PORCINE) 1000 UNIT/ML DIALYSIS
2000.0000 [IU] | Freq: Once | INTRAMUSCULAR | Status: DC
  Filled 2023-06-14: qty 2

## 2023-06-14 MED ORDER — HEPARIN SODIUM (PORCINE) 1000 UNIT/ML DIALYSIS: 1000.0000 [IU] | INTRAMUSCULAR | Status: DC | PRN

## 2023-06-14 MED ORDER — PENTAFLUOROPROP-TETRAFLUOROETH EX AERO: 1.0000 | INHALATION_SPRAY | CUTANEOUS | Status: DC | PRN

## 2023-06-14 MED ORDER — HEPARIN SODIUM (PORCINE) 1000 UNIT/ML IJ SOLN
INTRAMUSCULAR | Status: AC
  Filled 2023-06-14: qty 3

## 2023-06-14 MED ORDER — HEPARIN SODIUM (PORCINE) 1000 UNIT/ML DIALYSIS
3000.0000 [IU] | Freq: Once | INTRAMUSCULAR | Status: AC
  Administered 2023-06-14: 3000 [IU] via INTRAVENOUS_CENTRAL
  Filled 2023-06-14: qty 3

## 2023-06-14 NOTE — Progress Notes (Signed)
 Occupational Therapy Weekly Progress Note  Patient Details  Name: [name]. MRN: [id number] Date of Birth: February 01, 1961  Beginning of progress report period: June 06, 2023 End of progress report period: Jun 14, 2023   Patient has met 3of 3 short term goals.  Pt is making steady progress with BADLs and functional transfers. Pt currently requires min/mod A for bathing/dressing seated EOB and with sit<>stand in [place]. CGA for SB transfers including placement of SB before and after transfer. PWC mobiity with supervision. Family has not been present for therapy.   Patient continues to demonstrate the following deficits: muscle weakness and muscle joint tightness, decreased cardiorespiratoy endurance, impaired timing and sequencing, abnormal tone, unbalanced muscle activation, and decreased coordination, and decreased sitting balance, decreased standing balance, decreased postural control, and decreased balance strategies and therefore will continue to benefit from skilled OT intervention to enhance overall performance with BADL and Reduce care partner burden.  Patient progressing toward long term goals..  Continue plan of care.  OT Short Term Goals Week 2:  OT Short Term Goal 1 (Week 2): Pt will complete bed mobility at Mod A in preparation for LB dressing at bed level OT Short Term Goal 1 - Progress (Week 2): Met OT Short Term Goal 2 (Week 2): Pt will complete self feeding at Min A with LUE with AE as necessary OT Short Term Goal 2 - Progress (Week 2): Met OT Short Term Goal 3 (Week 2): Pt will complete UB dressing at Mod A OT Short Term Goal 3 - Progress (Week 2): Met Week 3:  OT Short Term Goal 1 (Week 3): Pt will complete LB dressing with mod A STS from w/c or EOB OT Short Term Goal 2 (Week 3): Pt will complete toileting tasks with max A OT Short Term Goal 3 (Week 3): Pt will complete bathing tasks with mod A   [name] 06/14/2023, 8:44 AM

## 2023-06-14 NOTE — Progress Notes (Signed)
 PROGRESS NOTE   Subjective/Complaints:  Pt reports doing well Knees doing "good".   Feels voice has gotten hoarse due to prednisone ? Not sure why, but is hoarse this AM.  Walked in lite gait yesterday.    ROS:    Pt denies SOB, abd pain, CP, N/V/C/D, and vision changes  Objective:   No results found.     Recent Labs    06/12/23 1516  WBC 6.3  HGB 7.3*  HCT 22.6*  PLT 370    Recent Labs    06/12/23 1516  NA 129*  K 5.6*  CL 91*  CO2 22  GLUCOSE 121*  BUN 88*  CREATININE 9.88*  CALCIUM  9.3     Intake/Output Summary (Last 24 hours) at 06/14/2023 0904 Last data filed at 06/14/2023 0751 Gross per 24 hour  Intake 593 ml  Output --  Net 593 ml        Physical Exam: Vital Signs Blood pressure 134/83, pulse 80, temperature 98.2 F (36.8 C), resp. rate 18, height 6\' 1"  (1.854 m), weight 89.9 kg, SpO2 100%.         General: awake, alert, appropriate, sitting up in dayroom, on mat; with OT; NAD HENT: conjugate gaze; oropharynx moist CV: regular rate and rhythm; no JVD Pulmonary: CTA B/L; no W/R/R- good air movement GI: soft, NT, ND, (+)BS Psychiatric: appropriate, jovial  Neurological: Ox3  MsK: B/L knees with effusions- less TTP on R knee this AM , no erythema, not really focally warm to touch- Both have effusions, - R hand swelling looks good/resolved Skin: no evidence of cellulitis to extremities.   PRIOR EXAMS: Neurological: Ox3  MSK- R hand swelling esp ulnar side of hand/dorsum and L wrist-stable this AM RUE- Biceps 4/5; WE 4-/5; Triceps 2/5; Grip 2-/5 and FA 1/5 LUE- 4/5 except triceps 3+ to 4-/5 RLE- HF 1 to 2-/5; KE 2-/5; DF/PF 2-/5 LLE- HF 3/5; KE 4-/5; DF/PF 4/5    Skin: C/D/I. Multiple lesions on BL legs with various stages of healing.  + RUE fistula - intact   MSK:      LLE knee with palpable effusion, no warmth, no ttp Mood: Pleasant affect, appropriate mood.   Neurologic exam: Alert and oriented x 4, fluent, follows commands, cranial nerves II through XII grossly intact, no hypertonia noted  Sensation: Reduced to light touch in distal right lower extremity to the mid-thigh       Strength:                RUE: 2/5 SA, 2/5 EF, 2/5 EE, 2/5 WE, 2/5 FF, 2/5 FA                LUE:  3/5 SA, 3/5 EF, 3/5 EE, 3/5 WE, 3/5 FF, 3/5 FA                RLE: 2/5 HF, 2/5 KE, 2/5  DF, 2/5  EHL, 2/5  PF                 LLE:  3/5 HF, 4/5 KE, 4/5  DF, 4/5  EHL, 4/5  PF  Neuroexam stable 4/21  Assessment/Plan: 1. Functional deficits which require  3+ hours per day of interdisciplinary therapy in a comprehensive inpatient rehab setting. Physiatrist is providing close team supervision and 24 hour management of active medical problems listed below. Physiatrist and rehab team continue to assess barriers to discharge/monitor patient progress toward functional and medical goals  Care Tool:  Bathing    Body parts bathed by patient: Right arm, Chest, Abdomen, Right upper leg, Left upper leg, Face   Body parts bathed by helper: Left arm, Front perineal area, Buttocks, Right lower leg, Left lower leg     Bathing assist Assist Level: Moderate Assistance - Patient 50 - 74%     Upper Body Dressing/Undressing Upper body dressing   What is the patient wearing?: Pull over shirt    Upper body assist Assist Level: Minimal Assistance - Patient > 75%    Lower Body Dressing/Undressing Lower body dressing      What is the patient wearing?: Pants     Lower body assist Assist for lower body dressing: Moderate Assistance - Patient 50 - 74%     Toileting Toileting Toileting Activity did not occur Press photographer and hygiene only): N/A (no void or bm) (anuric, HD patient)  Toileting assist Assist for toileting: Maximal Assistance - Patient 25 - 49%     Transfers Chair/bed transfer  Transfers assist     Chair/bed transfer assist level: Minimal Assistance -  Patient > 75%     Locomotion Ambulation   Ambulation assist   Ambulation activity did not occur: Safety/medical concerns  Assist level: 2 helpers Assistive device: [employer] (eva walker with lite gait +2) Max distance: 110'   Walk 10 feet activity   Assist  Walk 10 feet activity did not occur: Safety/medical concerns  Assist level: 2 helpers     Walk 50 feet activity   Assist Walk 50 feet with 2 turns activity did not occur: Safety/medical concerns  Assist level: 2 helpers Assistive device:  (eva walker with lite gait +2)    Walk 150 feet activity   Assist Walk 150 feet activity did not occur: Safety/medical concerns         Walk 10 feet on uneven surface  activity   Assist Walk 10 feet on uneven surfaces activity did not occur: Safety/medical concerns         Wheelchair     Assist Is the patient using a wheelchair?: Yes Type of Wheelchair: Power    Wheelchair assist level: Independent Max wheelchair distance: 150    Wheelchair 50 feet with 2 turns activity    Assist        Assist Level: Independent   Wheelchair 150 feet activity     Assist      Assist Level: Independent   Blood pressure 134/83, pulse 80, temperature 98.2 F (36.8 C), resp. rate 18, height 6\' 1"  (1.854 m), weight 89.9 kg, SpO2 100%.  Medical Problem List and Plan: 1. 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  Making gains in feeding himself 2.  Antithrombotics: -DVT/anticoagulation:  Mechanical: Sequential compression devices, below knee Bilateral lower extremities             -antiplatelet therapy: N/A--no antiplatelet due to hx of recurrent GIB  4/25- d/w family- they agree that Lovenox  not appropriate at this time.   -06/09/23 ordered dopplers to r/o clots given low grade temps recently  4/28- LE Dopplers (-) for DVT 3. Pain Management:  oxycodone  prn. Gabapentin  300 mg/hs.              --has Flexeril  10 mg TID prn              - patient with intermittent, severe b/l headache - add topomax 25 mg BID    4/19- pt rating pain 07/19/08 which is "tolerable"  4/22- pain slightly better this AM  4/25- fewer complaints of pain -06/09/23 having more b/l knee pain and using percocets more-- could be causing confusion? Advised judicious use, might benefit from knee aspirations/injections? 4/28- less knee pain this AM 4/30- pain about the same- just started Prednisone  yesterday per Ortho Steffanie Edouard request 5/1- fewer complaints about knee pain 5/2- new pain is doing "well" on prednisone , hopefully does better once prednisone  is stopped after 3 days 4. Mood/Behavior/Sleep: LCSW to follow for evaluation and support             -antipsychotic agents: N/A             --melatonin prn for insomnia.  -06/09/23 poor sleep, d/t interruptions, asked nursing to minimize interruptions between 11p-5a (apparently they can't put a sign up, but will communicate in huddle) 5. Neuropsych/cognition: This patient is capable of making decisions on his own behalf. -06/09/23 son concerned with confusion, could be from poor sleep/pain meds vs cefepime  toxicity; getting head CT, dopplers, consulted ID Dr. Levern Reader as below 4/28- cognitively intact this AM 4/30- 5/2-doing well cognitively and brighter affect 6. Skin/Wound Care: Routine pressure relief measures.   4/19- due to ulcers on legs, on low air loss mattress 7.  Fluids/Electrolytes/Nutrition: Strict I/O. Daily weights. Labs with HD. 8. ESRD- HD MWF: Schedule HD at the end of the day to help with tolerance of therapy             --Sensipar  and Renvela  for metabolic bone disease.   - Patient reports no longer makes urine  4/20- pt getting HD this AM- last HD was Wednesday.   4/21 dialysis moved to tomorrow  4/22- HD to be done today  4/25- Next HD today to get back on track  4/28- HD today  4/30- supposed to have HD today- will ask they recheck labs  5/1- got HD yesterday 9. Anemia of chronic disease: Monitor H/H, aranesp   -4/21 HGB lower 7.5, FOBT, recheck tomorrow  4/22- HB 8.7 on today's check-con't to monitor  -06/09/23 hgb 8.5 yesterday  4/28- Hb down to 7.7- 4/29- will try to recheck Thursday  4/30- if they don't check in HD, will check tomorrow 5/1- Hb down to 7.3- is Jehovah's Witness, so doesn't want transfusions? 10. H/o recurrent GIB/Jehovah's Witness: Continue Protonix  BID 11. H/o Hypotension: Has had drop  in BP with HD. Now cervical SCI.  --Monitor for symptoms day after HD and with activity. -4/18 baseline blood pressure stable, monitor with activity 4/19- BP actually slightly elevated today-  140s-150's- con't regimen, since can drop with HD and therapy 4/20- BP actually doing better today- in 100s'110s- however HR elevated, likely to fluid overload 4/21 nephrology started midodrine  10mg  before dialysis, BP appears to be doing a little better today 4/22- less hypotensive this AM laying- will monitor for [facility] 4/23- Tachycardia slightly better- in high 90's- having some intermittent elevated BP's- will monitor 4/24- BP running 140-s150s, but don't want to drop it since having HD 4/25- Having Hypotension with therapy, so don't want to reduce BP at all- doing TED's and ACE wraps- occ elevated, but with HD and therapy, not appropriate to reduce levels -06/08/23 BPs a little soft but HD yesterday; cont TEDs/ACE -06/09/23 BPs good,  monitor  4/29-5/1-  BP controlled- con't regimen Vitals:   06/12/23 1527 06/12/23 1557 06/12/23 1635 06/12/23 1709  BP: (!) 140/83 131/78 119/74 122/76   06/12/23 1730 06/12/23 1805 06/12/23 1857 06/12/23 2050  BP: 123/71 120/75 110/60 118/73   06/13/23 0501 06/13/23 1419 06/13/23 2009 06/14/23 0544  BP: 136/78 118/85 134/82 134/83     12. Constipation: Small BM on 04/14 --had of nulytey yesterday and another today? -Xray 4/17 without excessive stool, patient reports stool still little hard yesterday.  Will increase MiraLAX  to 3 times a day, continue Senokot twice a day. Suppository PRN   06/09/23 LBM yesterday, cont regimen  4/28- LBM overnight- has been refusing miralax - made it prn- taking metamucil  4/30- asking for Sorbitol  today since needs ot have bigger BM  5/1- ended up taking sorbitol  this AM- no result quite yet  5/2- LBM yeesterday per chart 13. ?left knee effusion. Not currently bothersome. Consider imaging.  14.  SIRS- Fever/hypotension/tachycardia - 4/21 Currently afebrile, he got to 50 cc IVF yesterday, CXR negative, EKG sinus tachycardia. Patient was started on Maxipime  and vancomycin  yesterday.  Appears to be doing much better today.  WBC not elevated at 7.1.  Continue to follow blood cultures 4/22- Blood Cx's (-) so far- WBC 7.3- will con't IV ABX until know final results of blood Cx's- CXR (-)- but temp was 103.2 yesterday overnight.  4/23- Blood C'x (-) x2 days so far- T 99.9 this Am after a few sips of coffee- was 100.4 this AM- will con't IV ABX since concerned about possible bacteremia- esp since having low grade temps still 4/24- No more fevers, even low grade- no growth on blood Cx' bu it says that didn't get enough blood, so much be falsely negative 4/25- No growth for 5 days, but it also said not enough blood to be sure was a true negative. Will change stop date til tomorrow -06/08/23 at 3am temp recorded as 100.3 but 5am was 98.3; will get CBC w/diff,  monitor temps closely today, if continued low grade temps, will reach out to ID. BCx NGTD x5d. Didn't get vanc yesterday? Or cefepime ? Unclear why. Cefepime  started this morning at 0011. -06/09/23 temp 100.3 at 3am yesterday and then 100 later in the day, but no fevers since, no tachycardia.  -WBC yesterday 8.9.  -Now with some confusion; spoke with Dr. Levern Reader of ID, stated cefepime  could be the cause of the confusion -recommended we stop all abx (which ended yesterday anyway) -watch the patient OFF abx -if fevers >100.3 occur then she recommended repeat BCx and CXR and could call her back to see if any abx would be advised at that time.  -Head CT and dopplers ordered as well, as above 4/28- CT looks good- no fever, WBC is  OK- off ABX 4/29- Also Dopplers (-) feeling good this AM 15. Tachycardia 4/22- doing better- right at 100 this AM- will monitor- likely can be from dregs of infection vs OH. -will monitor  4/23- Tachycardia improving- 4/24-27 HR in 90's right now- doing better  4/29- 4/30- HR running in 90s-low 100's today 16. R hand swelling  4/23- doing ice- will do xrays 4/24- Xray doesn't look like cause of swelling, however could be due to new shunt? 4/30- resolved 17. Hypocalcemia 4/24- his Albumin  is 2.1, so probably ok, and phos coming down- will leave to renal- thank you for their help  5/1- Ca 9.3 18. Hyperkalemia 4/28- K+ 6.3 today- got hypoosmotic bath to reduce K+ per note from RN/HD nurse? Also has Na of 127- being addressed by HD/renal- Phos also 5.8- but again, getting HD today 5/1- K+ down to 5.6 and Na 129 19. R>L knee pain  4/29- will ask Ortho if they will tap his R knee- I have no problem doing injection of knee, however with so much fluid, I would rather Ortho tap him and then do steroid injection.   4/30- we added Prednisone  40 mg daily x 3 days- to see if helps knee pain  5/1- mild reduction in pain so far  5/2- knees feeling good!   I spent a total of 38    minutes on total care today- >50% coordination of care- due to  D/w pt as well as OT about progress- as well as knee pain; labs- and HD today-     LOS: 15 days A FACE TO FACE EVALUATION WAS PERFORMED  Corra Kaine 06/14/2023, 9:04 AM

## 2023-06-14 NOTE — Progress Notes (Addendum)
 Received patient in bed,alert and oriented x 4. Consent verified.  Access used: Right arm avf that worked well.  Duration of treatment: 3.5 hours.  UF goal: 3 liters,met.  Medicine given: Heparin  3,000 units pre-run dose.  Hand off to the patient's nurse ,into his room via transporter with stable condition.

## 2023-06-14 NOTE — Progress Notes (Signed)
 Occupational Therapy Session Note  Patient Details  Name: Carl Gramza Sr. MRN: [id number] Date of Birth: 03-10-1960  Today's Date: 06/14/2023 OT Individual Time: 4098-1191 OT Individual Time Calculation (min): 29 min    Short Term Goals: Week 3:  OT Short Term Goal 1 (Week 3): Pt will complete LB dressing with mod A STS from w/c or EOB OT Short Term Goal 2 (Week 3): Pt will complete toileting tasks with max A OT Short Term Goal 3 (Week 3): Pt will complete bathing tasks with mod A  Skilled Therapeutic Interventions/Progress Updates:  Pt greeted seated in PWC, pt agreeable to OT intervention to make up for missed minutes.   Session focused on BUE therex to improve overall strength/endurance for higher level ADL. Placed cone on the middle of a  theraband to provide larger grip d/t impaired grasp, pt completed below therex: Exercises:  X20 forward punches X20 diagonal punches  X20 bicep curls  Pt additionally completed hand strengthening exercises to promote improved grip strength mostly for RUE with compliant sponge. Exercises included composite digit flexion/extension, and wrist flexion/extension with pt pushing down on sponge.  Handed off pt directly to tech for next session.                    Therapy Documentation Precautions:  Precautions Precautions: Fall, Cervical, Other (comment) (HD MWF (RUE restricted).) Recall of Precautions/Restrictions: Impaired Precaution/Restrictions Comments: Verbally reviewed cervical precautions. Required Braces or Orthoses: Cervical Brace Cervical Brace: Hard collar Restrictions Weight Bearing Restrictions Per Provider Order: No   Pain: No pain    Therapy/Group: Individual Therapy  Mollie Anger [facility] 06/14/2023, 12:21 PM

## 2023-06-14 NOTE — Progress Notes (Signed)
 [facility] Progress Note   Subjective:   Seen in room, feeling well. Denies SOB, CP, dizziness.  Objective Vitals:   06/13/23 0506 06/13/23 1419 06/13/23 2009 06/14/23 0544  BP:  118/85 134/82 134/83  Pulse:  85 84 80  Resp:  18 18 18   Temp:  98.3 F (36.8 C) 97.9 F (36.6 C) 98.2 F (36.8 C)  TempSrc:   Oral   SpO2:  100% 100%   Weight: 89.9 kg     Height:       Physical Exam General: Alert male in NAD Heart: RRR, no murmurs, rubs or gallops Lungs: CTA bilaterally, respirations unlabored Abdomen: Soft, non-distended, +BS Extremities: trace edema, wearing compression stockings Dialysis Access:  AVF + t/b  Additional Objective Labs: Basic Metabolic Panel: Recent Labs  Lab 06/10/23 1416 06/12/23 1516  NA 127* 129*  K 6.3* 5.6*  CL 90* 91*  CO2 20* 22  GLUCOSE 133* 121*  BUN 97* 88*  CREATININE 12.41* 9.88*  CALCIUM  9.1 9.3  PHOS 5.8*  --    Liver Function Tests: Recent Labs  Lab 06/10/23 1416  ALBUMIN  2.0*   No results for input(s): "LIPASE", "AMYLASE" in the last 168 hours. CBC: Recent Labs  Lab 06/08/23 1248 06/10/23 1416 06/12/23 1516  WBC 8.9 9.3 6.3  NEUTROABS 6.2  --  4.4  HGB 8.5* 7.7* 7.3*  HCT 26.2* 24.5* 22.6*  MCV 88.8 90.4 90.0  PLT 301 325 370   Blood Culture    Component Value Date/Time   SDES BLOOD LEFT HAND 06/02/2023 1743   SDES BLOOD LEFT HAND 06/02/2023 1743   SPECREQUEST  06/02/2023 1743    BOTTLES DRAWN AEROBIC AND ANAEROBIC Blood Culture results may not be optimal due to an inadequate volume of blood received in culture bottles   SPECREQUEST  06/02/2023 1743    AEROBIC BOTTLE ONLY Blood Culture results may not be optimal due to an inadequate volume of blood received in culture bottles   CULT  06/02/2023 1743    NO GROWTH 5 DAYS Performed at [facility], [address], [place], [place] [address]    CULT  06/02/2023 1743    NO GROWTH 5 DAYS Performed at [facility], [address],  [place], [place] [address]    REPTSTATUS 06/07/2023 FINAL 06/02/2023 1743   REPTSTATUS 06/07/2023 FINAL 06/02/2023 1743    Cardiac Enzymes: No results for input(s): "CKTOTAL", "CKMB", "CKMBINDEX", "TROPONINI" in the last 168 hours. CBG: Recent Labs  Lab 06/10/23 1149 06/10/23 2037 06/11/23 0504 06/11/23 1127 06/11/23 1614  GLUCAP 103* 143* 91 107* 120*   Iron Studies: No results for input(s): "IRON", "TIBC", "TRANSFERRIN", "FERRITIN" in the last 72 hours. @lablastinr3 @ Studies/Results: No results found. Medications:   (feeding supplement) PROSource Plus  30 mL Oral BID BM   atorvastatin   10 mg Oral Q Thu   carbamide peroxide  5 drop Right EAR BID   Chlorhexidine  Gluconate Cloth  6 each Topical Q0600   Chlorhexidine  Gluconate Cloth  6 each Topical Q0600   Chlorhexidine  Gluconate Cloth  6 each Topical Q0600   cinacalcet   120 mg Oral Q supper   darbepoetin (ARANESP ) injection - DIALYSIS  150 mcg Subcutaneous Q Tue-1800   gabapentin   300 mg Oral QHS   Gerhardt's butt cream   Topical BID   [START ON 06/15/2023] heparin   2,000 Units Dialysis Once in dialysis   heparin   3,000 Units Dialysis Once in dialysis   pantoprazole   40 mg Oral  BID AC   psyllium  1 packet Oral TID   senna-docusate  2 tablet Oral BID   sevelamer  carbonate  3,200 mg Oral TID WC   topiramate   25 mg Oral BID    Dialysis Orders: OP Dialysis Orders [facility] - MWF 4h  B400   85.5kg   2K bath  AVF   - Heparin  3000 units IV three times per week  - Mircera 60mcg IV q 2 weeks- last dose 05/24/23  - Calcitriol  1.5mcg PO q HD    Assessment/Plan:  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 Now in CIR ESRD: Off schedule last week, has resumed MWF schedule, K+ running high, reinforce low K diet.  HTN/volume: BP controlled/stable, still above EDW with some edema on exam, UFG 3L as tolerated Anemia of ESRD: Hgb 7.3, aranesp  dose recently increased Secondary HPTH: CorrCa >10, will hold  calcitriol  for now/Phos 5.8- continue sevelamer  and sensipar  Nutrition: Alb 2.0, continue protein supplements  Ramona Burner, PA-C 06/14/2023, 10:35 AM  [facility] Pager: [phone]

## 2023-06-14 NOTE — Progress Notes (Signed)
   06/14/23 1232  Spiritual Encounters  Type of Visit Follow up  Care provided to: Patient  Conversation partners present during encounter Nurse  Reason for visit Advance directives   Chaplain following up with Pt who desires for his son to hear the ACD education.  Arrived in the room to find Pt in therapy and no son present.  RN stated she will call when they are ready.  Chaplain services remain available by Spiritual Consult or for emergent cases, paging [phone]  Chaplain Dewitte Foreman, MDiv [email or web address] [phone]

## 2023-06-14 NOTE — Progress Notes (Signed)
 Occupational Therapy Session Note  Patient Details  Name: Carl Monigold Sr. MRN: [id number] Date of Birth: 1960-08-08  Today's Date: 06/14/2023 OT Individual Time: 9629-5284 OT Individual Time Calculation (min): 71 min    Short Term Goals: Week 1:  OT Short Term Goal 1 (Week 1): Pt will perform toilet transfer with consistent Mod A (x1) + LRAD. OT Short Term Goal 1 - Progress (Week 1): Not met OT Short Term Goal 2 (Week 1): Pt will thread BLE into LB garments with Min A + LRAD. OT Short Term Goal 2 - Progress (Week 1): Not met OT Short Term Goal 3 (Week 1): Pt will bathe UB with Min A + LRAD. OT Short Term Goal 3 - Progress (Week 1): Not met  Skilled Therapeutic Interventions/Progress Updates:    Pt sitting EOB upon arrival eating breakfast. Pt donned shirt prior to OTA entering room. Initial focus on LB dressing with sit<>stand from EOB. Reacher provided and pt able to thready BLE into pants without assistance. Sit<>stand from EOB in [place] with CGA. Pt initiated pulling pants over hips but required assistance to complete. Since pt sitting on [place], [place] used for transfer to [facility]. Pt transitioned to day room and transferred with SB to EOM with CGA. Pt able to place and remove SB. Sitting balance activities with 1# bar tapping therapy ball. Focus on sittign balance and core strengthening. 5x15. Transfer back to [facility] with CGA. Pt retunred to room and remained in [facility] with all needs within reach.   Therapy Documentation Precautions:  Precautions Precautions: Fall, Cervical, Other (comment) (HD MWF (RUE restricted).) Recall of Precautions/Restrictions: Impaired Precaution/Restrictions Comments: Verbally reviewed cervical precautions. Required Braces or Orthoses: Cervical Brace Cervical Brace: Hard collar Restrictions Weight Bearing Restrictions Per Provider Order: No Pain:  Pt denies pain this morning   Therapy/Group: Individual Therapy  [name] 06/14/2023, 8:38 AM

## 2023-06-14 NOTE — Progress Notes (Signed)
 Physical Therapy Weekly Progress Note  Patient Details  Name: [name]. MRN: [id number] Date of Birth: 1960/03/07  Beginning of progress report period: June 08, 2023 End of progress report period: Jun 14, 2023  Today's Date: 06/14/2023   Patient has met 2 of 3 short term goals.  Pt is making great progress overall. He is progressing transfers with slideboard with overall min assist due to assist needed with board placement, min to CGA for balance during transfers, and some assist with w/c parts management/set-up. Pt has also progressed to using Blanca Bunch +2 assist for sit <> stands and initiated gait in Lite Gait wit Blanca Bunch with +2 assist.yesterday progressing to Marlea Silvers walker only today.  Pt using PWC for mobility at supervision to modified independent level. Family conference occurred last week with potential for SNF due to home access/family support barriers with HD.  Patient continues to demonstrate the following deficits muscle weakness and muscle joint tightness, decreased cardiorespiratoy endurance, abnormal tone and decreased coordination, and decreased sitting balance, decreased standing balance, decreased postural control, decreased balance strategies, and difficulty maintaining precautions and therefore will continue to benefit from skilled PT intervention to increase functional independence with mobility.  Patient progressing toward long term goals.Aaron Aas  Updated gait goal to reflect progress.   PT Short Term Goals Week 2:  PT Short Term Goal 1 (Week 2): Pt will progress transfers to mod assist PT Short Term Goal 1 - Progress (Week 2): Met PT Short Term Goal 2 (Week 2): Pt will tolerate standing with lift equipment x 5 min preparation for gait PT Short Term Goal 2 - Progress (Week 2): Met PT Short Term Goal 3 (Week 2): Pt will be able to perform bed mobilty with min assist for supine <> sit PT Short Term Goal 3 - Progress (Week 2): Progressing toward goal Week 3:  PT Short  Term Goal 1 (Week 3): Pt will perform bed mobility with min assist PT Short Term Goal 2 (Week 3): Pt will perform sit <> stands with mod A PT Short Term Goal 3 (Week 3): Pt will be able to gait x 150' with mod assist PT Short Term Goal 4 (Week 3): Pt will be able to independently perform and verbalize pressure relief techniques with PWC  Skilled Therapeutic Interventions/Progress Updates:  Ambulation/gait training;Cognitive remediation/compensation;Discharge planning;DME/adaptive equipment instruction;Functional mobility training;Pain management;Psychosocial support;Therapeutic Activities;Splinting/orthotics;UE/LE Strength taining/ROM;Visual/perceptual remediation/compensation;Wheelchair Designer, jewellery;Therapeutic Exercise;Skin care/wound management;Patient/family education;Neuromuscular re-education;Disease management/prevention;Balance/vestibular training;Community reintegration;Functional electrical stimulation   Therapy Documentation Precautions:  Precautions Precautions: Fall, Cervical, Other (comment) (HD MWF (RUE restricted).) Recall of Precautions/Restrictions: Impaired Precaution/Restrictions Comments: Verbally reviewed cervical precautions. Required Braces or Orthoses: Cervical Brace Cervical Brace: Hard collar Restrictions Weight Bearing Restrictions Per Provider Order: No   Session #1: PT Individual Time: 9480-1655 PT Individual Time Calculation (min): 40 min  Pain:No complaints of pain  Session focused on PWC mobility on unit with emphasis on pt setting up all parts of w.c (pt able to manage footplates himself today!) to prepare for transfers.Performed slideboard transfers with close supervision to CGA overall for transfer and assist with board placement still needed for correct positioning though pt attempting to place himself. Focused on sit > stands with Marlea Silvers (elevating it once almost all the way up) with elevated mat, pt  able to perform with max assist, cues for technique, facilitation of weightshift and cues for activation of trunk and hips. Once in standing, focused on NMR for standing balance, postural control retraining, marching and then progression to  gait x 100' with Marlea Silvers waLker with min to mod assist of 1 with steering assist needed and +2 for safety and w/c follow. Transferred back to w/c as described above and educated pt on progression of moving towards stand pivot transfers,etc.   Session #2: PT Individual Time: 8657-8469 PT Individual Time Calculation (min): 72 min  Pain: No reports of pain Session focused on NMR to address functional mobility with transfers, pregait and gait including postural control retraining, dynamic sitting and standing balance activities and sit <> stands. Pt transferred withotu slideboard per pt request from [facility] to mat table (pt setting up independently except abductor pad) with min assist for scoot - does not demonstrate clearance of bottom and educated on how this can impact skin integrity with repetition. Dynamic sitting balance and postural control retraining EOM while performing functional reaching and toss to [employer]. Sit > stand with +2 assist with mod/max without UE support and focused on standing with equal WB, balance retraining, approaching mod assist of 1 and then progressing to dynamic activity in standing to continue cornhole toss with bean bag manipulation. Utilized PFRW to progress from IXL, with mod to max +2 for sit > stand and then in standing with min to mod assist - progressed to marching and taking a step forward and back but pt did not want to attempt to walk with this just yet due to stability. Focused on NMR in standing or R knee extension activation, upright trunk and postural control retraining with manual and verbal cues. Continued NMR with gait with Marlea Silvers walker back to pt room x 75' with min to mod assist +2 with focus on upright posture, RLE clearance  (some toe drag as fatigued) and knee extension in stance. Transitioned to [facility] and added back all modifications (pool noodle, gait belt around knees, and seat belt).    Therapy/Group: Individual Therapy  Gita Lamb Amadeo June, PT, DPT, CBIS  06/14/2023, 11:38 AM

## 2023-06-14 NOTE — Plan of Care (Signed)
  Problem: RH Ambulation Goal: LTG Patient will ambulate in controlled environment (PT) Description: LTG: Patient will ambulate in a controlled environment, # of feet with assistance (PT). Flowsheets (Taken 06/14/2023 1105) LTG: Pt will ambulate in controlled environ  assist needed:: (upgraded due to progress) Minimal Assistance - Patient > 75% LTG: Ambulation distance in controlled environment: 150' Note: Upgraded due to progress

## 2023-06-15 DIAGNOSIS — I9589 Other hypotension: Secondary | ICD-10-CM

## 2023-06-15 NOTE — Progress Notes (Signed)
 PROGRESS NOTE   Subjective/Complaints:  Doing well! Slept ok, pain well managed, LBM yesterday and again this morning, aneuric at baseline, dialysis yesterday was fine.    ROS:    Pt denies SOB, abd pain, CP, N/V/C/D, and vision changes  Objective:   No results found.     Recent Labs    06/12/23 1516 06/14/23 1450  WBC 6.3 8.0  HGB 7.3* 7.5*  HCT 22.6* 23.6*  PLT 370 436*    Recent Labs    06/12/23 1516 06/14/23 1326  NA 129* 132*  K 5.6* 5.2*  CL 91* 92*  CO2 22 22  GLUCOSE 121* 105*  BUN 88* 88*  CREATININE 9.88* 8.60*  CALCIUM  9.3 9.7     Intake/Output Summary (Last 24 hours) at 06/15/2023 1315 Last data filed at 06/15/2023 0840 Gross per 24 hour  Intake 297 ml  Output 3000 ml  Net -2703 ml        Physical Exam: Vital Signs Blood pressure 99/66, pulse 83, temperature 98.3 F (36.8 C), resp. rate 17, height 6\' 1"  (1.854 m), weight 87.6 kg, SpO2 100%.     General: awake, alert, appropriate, sitting up in w/c; NAD HENT: conjugate gaze; oropharynx moist CV: regular rate and rhythm; no JVD Pulmonary: CTA B/L; no W/R/R- good air movement GI: soft, NT, ND, (+)BS Psychiatric: appropriate, jovial  Neurological: Ox3 Skin: no evidence of cellulitis to extremities.   PRIOR EXAMS: MsK: B/L knees with effusions- less TTP on R knee this AM , no erythema, not really focally warm to touch- Both have effusions, - R hand swelling looks good/resolved  MSK- R hand swelling esp ulnar side of hand/dorsum and L wrist-stable this AM RUE- Biceps 4/5; WE 4-/5; Triceps 2/5; Grip 2-/5 and FA 1/5 LUE- 4/5 except triceps 3+ to 4-/5 RLE- HF 1 to 2-/5; KE 2-/5; DF/PF 2-/5 LLE- HF 3/5; KE 4-/5; DF/PF 4/5    Skin: C/D/I. Multiple lesions on BL legs with various stages of healing.  + RUE fistula - intact   MSK:      LLE knee with palpable effusion, no warmth, no ttp Mood: Pleasant affect, appropriate mood.   Neurologic exam: Alert and oriented x 4, fluent, follows commands, cranial nerves II through XII grossly intact, no hypertonia noted  Sensation: Reduced to light touch in distal right lower extremity to the mid-thigh       Strength:                RUE: 2/5 SA, 2/5 EF, 2/5 EE, 2/5 WE, 2/5 FF, 2/5 FA                LUE:  3/5 SA, 3/5 EF, 3/5 EE, 3/5 WE, 3/5 FF, 3/5 FA                RLE: 2/5 HF, 2/5 KE, 2/5  DF, 2/5  EHL, 2/5  PF                 LLE:  3/5 HF, 4/5 KE, 4/5  DF, 4/5  EHL, 4/5  PF  Neuroexam stable 4/21  Assessment/Plan: 1. Functional deficits which require 3+ hours per day of  interdisciplinary therapy in a comprehensive inpatient rehab setting. Physiatrist is providing close team supervision and 24 hour management of active medical problems listed below. Physiatrist and rehab team continue to assess barriers to discharge/monitor patient progress toward functional and medical goals  Care Tool:  Bathing    Body parts bathed by patient: Right arm, Chest, Abdomen, Right upper leg, Left upper leg, Face   Body parts bathed by helper: Left arm, Front perineal area, Buttocks, Right lower leg, Left lower leg     Bathing assist Assist Level: Moderate Assistance - Patient 50 - 74%     Upper Body Dressing/Undressing Upper body dressing   What is the patient wearing?: Pull over shirt    Upper body assist Assist Level: Minimal Assistance - Patient > 75%    Lower Body Dressing/Undressing Lower body dressing      What is the patient wearing?: Pants     Lower body assist Assist for lower body dressing: Moderate Assistance - Patient 50 - 74%     Toileting Toileting Toileting Activity did not occur Press photographer and hygiene only): N/A (no void or bm) (anuric, HD patient)  Toileting assist Assist for toileting: Maximal Assistance - Patient 25 - 49%     Transfers Chair/bed transfer  Transfers assist     Chair/bed transfer assist level: Minimal Assistance -  Patient > 75%     Locomotion Ambulation   Ambulation assist   Ambulation activity did not occur: Safety/medical concerns  Assist level: 2 helpers Assistive device: Blanca Bunch Max distance: 100'   Walk 10 feet activity   Assist  Walk 10 feet activity did not occur: Safety/medical concerns  Assist level: 2 helpers (+2 for safety; mod A)     Walk 50 feet activity   Assist Walk 50 feet with 2 turns activity did not occur: Safety/medical concerns  Assist level: 2 helpers Assistive device: Walker-Eva    Walk 150 feet activity   Assist Walk 150 feet activity did not occur: Safety/medical concerns  Assist level: 2 helpers      Walk 10 feet on uneven surface  activity   Assist Walk 10 feet on uneven surfaces activity did not occur: Safety/medical concerns         Wheelchair     Assist Is the patient using a wheelchair?: Yes Type of Wheelchair: Power    Wheelchair assist level: Independent Max wheelchair distance: 150    Wheelchair 50 feet with 2 turns activity    Assist        Assist Level: Independent   Wheelchair 150 feet activity     Assist      Assist Level: Independent   Blood pressure 99/66, pulse 83, temperature 98.3 F (36.8 C), resp. rate 17, height 6\' 1"  (1.854 m), weight 87.6 kg, SpO2 100%.  Medical Problem List and Plan: 1. 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  Making  gains in feeding himself 2.  Antithrombotics: -DVT/anticoagulation:  Mechanical: Sequential compression devices, below knee Bilateral lower extremities             -antiplatelet therapy: N/A--no antiplatelet due to hx of recurrent GIB  4/25- d/w family- they agree that Lovenox  not appropriate at this time.   -06/09/23 ordered dopplers to r/o clots given low grade temps recently  4/28- LE Dopplers (-) for DVT 3. Pain Management:  oxycodone  prn. Gabapentin  300 mg/hs.              --has Flexeril  10 mg TID prn              - patient with intermittent, severe b/l headache - add topomax 25 mg BID    4/19- pt rating pain 07/19/08 which is "tolerable"  4/22- pain slightly better this AM  4/25- fewer complaints of pain -06/09/23 having more b/l knee pain and using percocets more-- could be causing confusion? Advised judicious use, might benefit from knee aspirations/injections? 4/28- less knee pain this AM 4/30- pain about the same- just started Prednisone  yesterday per Ortho Steffanie Edouard request 5/1- fewer complaints about knee pain 5/2- new pain is doing "well" on prednisone , hopefully does better once prednisone  is stopped after 3 days 4. Mood/Behavior/Sleep: LCSW to follow for evaluation and support             -antipsychotic agents: N/A             --melatonin prn for insomnia.  -06/09/23 poor sleep, d/t interruptions, asked nursing to minimize interruptions between 11p-5a (apparently they can't put a sign up, but will communicate in huddle) 5. Neuropsych/cognition: This patient is capable of making decisions on his own behalf. -06/09/23 son concerned with confusion, could be from poor sleep/pain meds vs cefepime  toxicity; getting head CT, dopplers, consulted ID Dr. Levern Reader as below 4/28- cognitively intact this AM 4/30- 5/3-doing well cognitively and brighter affect 6. Skin/Wound Care: Routine pressure relief measures.   4/19- due to ulcers on legs, on low air loss mattress 7.  Fluids/Electrolytes/Nutrition: Strict I/O. Daily weights. Labs with HD. 8. ESRD- HD MWF: Schedule HD at the end of the day to help with tolerance of therapy             --Sensipar  and Renvela  for metabolic bone disease.   - Patient reports no longer makes urine  4/20- pt getting HD this AM- last HD was Wednesday.   4/21 dialysis moved to tomorrow  4/22- HD to be done today  4/25- Next HD today to get back on track  4/28- HD today  4/30- supposed to have HD today- will ask they recheck labs  5/3- got HD yesterday 9. Anemia of chronic disease: Monitor H/H, aranesp   -4/21 HGB lower 7.5, FOBT, recheck tomorrow  4/22- HB 8.7 on today's check-con't to monitor  -06/09/23 hgb 8.5 yesterday  4/28- Hb down to 7.7- 4/29- will try to recheck Thursday  4/30- if they don't check in HD, will check tomorrow 5/1- Hb down to 7.3- is Jehovah's Witness, so doesn't want transfusions? 10. H/o recurrent GIB/Jehovah's Witness: Continue Protonix  BID 11. H/o Hypotension: Has had drop  in BP with HD. Now cervical SCI.  --Monitor for symptoms day after HD and with activity. -4/18 baseline blood pressure stable, monitor with activity 4/19- BP actually slightly elevated today- 140s-150's- con't regimen, since can drop with HD and therapy 4/20-  BP actually doing better today- in 100s'110s- however HR elevated, likely to fluid overload 4/21 nephrology started midodrine  10mg  before dialysis, BP appears to be doing a little better today 4/22- less hypotensive this AM laying- will monitor for [facility] 4/23- Tachycardia slightly better- in high 90's- having some intermittent elevated BP's- will monitor 4/24- BP running 140-s150s, but don't want to drop it since having HD 4/25- Having Hypotension with therapy, so don't want to reduce BP at all- doing TED's and ACE wraps- occ elevated, but with HD and therapy, not appropriate to reduce levels 4/29-5/3-  BP controlled- softer on dialysis days but stable; con't regimen Vitals:    06/14/23 1445 06/14/23 1500 06/14/23 1535 06/14/23 1600  BP: 133/81 136/89 130/84 104/74   06/14/23 1630 06/14/23 1700 06/14/23 1730 06/14/23 1800  BP: (!) 142/83 (!) 152/86 114/74 117/74   06/14/23 1819 06/14/23 1830 06/14/23 1909 06/15/23 0554  BP: 96/70 96/70 112/72 99/66     12. Constipation: Small BM on 04/14 --had of nulytey yesterday and another today? -Xray 4/17 without excessive stool, patient reports stool still little hard yesterday.  Will increase MiraLAX  to 3 times a day, continue Senokot twice a day. Suppository PRN   06/09/23 LBM yesterday, cont regimen 4/28- LBM overnight- has been refusing miralax - made it prn- taking metamucil  4/30- asking for Sorbitol  today since needs ot have bigger BM  5/1- ended up taking sorbitol  this AM- no result quite yet  06/15/23 LBM today 13. ?left knee effusion. Not currently bothersome. Consider imaging.  14.  SIRS- Fever/hypotension/tachycardia - 4/21 Currently afebrile, he got to 50 cc IVF yesterday, CXR negative, EKG sinus tachycardia. Patient was started on Maxipime  and vancomycin  yesterday.  Appears to be doing much better today.  WBC not elevated at 7.1.  Continue to follow blood cultures 4/22- Blood Cx's (-) so far- WBC 7.3- will con't IV ABX until know final results of blood Cx's- CXR (-)- but temp was 103.2 yesterday overnight.  4/23- Blood C'x (-) x2 days so far- T 99.9 this Am after a few sips of coffee- was 100.4 this AM- will con't IV ABX since concerned about possible bacteremia- esp since having low grade temps still 4/24- No more fevers, even low grade- no growth on blood Cx' bu it says that didn't get enough blood, so much be falsely negative 4/25- No growth for 5 days, but it also said not enough blood to be sure was a true negative. Will change stop date til tomorrow -06/08/23 at 3am temp recorded as 100.3 but 5am was 98.3; will get CBC w/diff, monitor temps closely today, if continued low grade temps, will reach out to ID.  BCx NGTD x5d. Didn't get vanc yesterday? Or cefepime ? Unclear why. Cefepime  started this morning at 0011. -06/09/23 temp 100.3 at 3am yesterday and then 100 later in the day, but no fevers since, no tachycardia.  -WBC yesterday 8.9.  -Now with some confusion; spoke with Dr. Levern Reader of ID, stated cefepime  could be the cause of the confusion -recommended we stop all abx (which ended yesterday anyway) -watch the patient OFF abx -if fevers >100.3 occur then she recommended repeat BCx and CXR and could call her back to see if any abx would be advised at that time.  -Head CT and dopplers ordered as well, as above 4/28- CT looks good- no fever, WBC is OK- off ABX 4/29- Also Dopplers (-) feeling good this AM 15. Tachycardia 4/22- doing better- right at 100 this AM- will  monitor- likely can be from dregs of infection vs OH. -will monitor  4/23- Tachycardia improving- 4/24-27 HR in 90's right now- doing better  4/29- 4/30- HR running in 90s-low 100's today 16. R hand swelling  4/23- doing ice- will do xrays 4/24- Xray doesn't look like cause of swelling, however could be due to new shunt? 4/30- resolved 17. Hypocalcemia 4/24- his Albumin  is 2.1, so probably ok, and phos coming down- will leave to renal- thank you for their help  5/1- Ca 9.3 18. Hyperkalemia 4/28- K+ 6.3 today- got hypoosmotic bath to reduce K+ per note from RN/HD nurse? Also has Na of 127- being addressed by HD/renal- Phos also 5.8- but again, getting HD today 5/1- K+ down to 5.6 and Na 129 19. R>L knee pain  4/29- will ask Ortho if they will tap his R knee- I have no problem doing injection of knee, however with so much fluid, I would rather Ortho tap him and then do steroid injection.   4/30- we added Prednisone  40 mg daily x 3 days- to see if helps knee pain  5/1- mild reduction in pain so far  5/2- knees feeling good!    LOS: 16 days A FACE TO FACE EVALUATION WAS PERFORMED  [id number] 06/15/2023, 1:15 PM

## 2023-06-15 NOTE — Progress Notes (Signed)
 [facility]  Tolerated HD yesterday without issues. Nephrology will plan to see patient on Monday. Please call if any issues arise.  Ramona Burner, PA-C 06/15/2023, 9:55 AM  [facility] Pager: [phone]

## 2023-06-16 MED ORDER — CHLORHEXIDINE GLUCONATE CLOTH 2 % EX PADS
6.0000 | MEDICATED_PAD | Freq: Every day | CUTANEOUS | Status: DC
  Administered 2023-06-18 – 2023-06-19 (×2): 6 via TOPICAL

## 2023-06-16 NOTE — Progress Notes (Signed)
 PROGRESS NOTE   Subjective/Complaints:  Pt doing well again today. Slept well, denies pain currently, LBM yesterday, anuric at baseline, dialysis Friday was fine. No other complaints or concerns.    ROS:    Pt denies SOB, abd pain, CP, N/V/C/D, and vision changes  Objective:   No results found.     Recent Labs    06/14/23 1450  WBC 8.0  HGB 7.5*  HCT 23.6*  PLT 436*    Recent Labs    06/14/23 1326  NA 132*  K 5.2*  CL 92*  CO2 22  GLUCOSE 105*  BUN 88*  CREATININE 8.60*  CALCIUM  9.7     Intake/Output Summary (Last 24 hours) at 06/16/2023 1008 Last data filed at 06/15/2023 1819 Gross per 24 hour  Intake 720 ml  Output --  Net 720 ml        Physical Exam: Vital Signs Blood pressure 105/71, pulse 98, temperature 99.1 F (37.3 C), resp. rate 17, height 6\' 1"  (1.854 m), weight 94.9 kg, SpO2 98%.     General: awake, alert, appropriate, resting in bed; NAD HENT: conjugate gaze; oropharynx moist CV: regular rate and rhythm; no JVD Pulmonary: CTA B/L; no W/R/R- good air movement GI: soft, NT, ND, (+)BS Psychiatric: appropriate, jovial, in better spirits this weekend Neurological: Ox3 Skin: no evidence of cellulitis to extremities.   PRIOR EXAMS: MsK: B/L knees with effusions- less TTP on R knee this AM , no erythema, not really focally warm to touch- Both have effusions, - R hand swelling looks good/resolved  MSK- R hand swelling esp ulnar side of hand/dorsum and L wrist-stable this AM RUE- Biceps 4/5; WE 4-/5; Triceps 2/5; Grip 2-/5 and FA 1/5 LUE- 4/5 except triceps 3+ to 4-/5 RLE- HF 1 to 2-/5; KE 2-/5; DF/PF 2-/5 LLE- HF 3/5; KE 4-/5; DF/PF 4/5    Skin: C/D/I. Multiple lesions on BL legs with various stages of healing.  + RUE fistula - intact   MSK:      LLE knee with palpable effusion, no warmth, no ttp Mood: Pleasant affect, appropriate mood.  Neurologic exam: Alert and oriented x  4, fluent, follows commands, cranial nerves II through XII grossly intact, no hypertonia noted  Sensation: Reduced to light touch in distal right lower extremity to the mid-thigh       Strength:                RUE: 2/5 SA, 2/5 EF, 2/5 EE, 2/5 WE, 2/5 FF, 2/5 FA                LUE:  3/5 SA, 3/5 EF, 3/5 EE, 3/5 WE, 3/5 FF, 3/5 FA                RLE: 2/5 HF, 2/5 KE, 2/5  DF, 2/5  EHL, 2/5  PF                 LLE:  3/5 HF, 4/5 KE, 4/5  DF, 4/5  EHL, 4/5  PF  Neuroexam stable 4/21  Assessment/Plan: 1. Functional deficits which require 3+ hours per day of interdisciplinary therapy in a comprehensive inpatient rehab setting. Physiatrist is  providing close team supervision and 24 hour management of active medical problems listed below. Physiatrist and rehab team continue to assess barriers to discharge/monitor patient progress toward functional and medical goals  Care Tool:  Bathing    Body parts bathed by patient: Right arm, Chest, Abdomen, Right upper leg, Left upper leg, Face   Body parts bathed by helper: Left arm, Front perineal area, Buttocks, Right lower leg, Left lower leg     Bathing assist Assist Level: Moderate Assistance - Patient 50 - 74%     Upper Body Dressing/Undressing Upper body dressing   What is the patient wearing?: Pull over shirt    Upper body assist Assist Level: Minimal Assistance - Patient > 75%    Lower Body Dressing/Undressing Lower body dressing      What is the patient wearing?: Pants     Lower body assist Assist for lower body dressing: Moderate Assistance - Patient 50 - 74%     Toileting Toileting Toileting Activity did not occur Press photographer and hygiene only): N/A (no void or bm) (anuric, HD patient)  Toileting assist Assist for toileting: Maximal Assistance - Patient 25 - 49%     Transfers Chair/bed transfer  Transfers assist     Chair/bed transfer assist level: Minimal Assistance - Patient > 75%      Locomotion Ambulation   Ambulation assist   Ambulation activity did not occur: Safety/medical concerns  Assist level: 2 helpers Assistive device: Blanca Bunch Max distance: 100'   Walk 10 feet activity   Assist  Walk 10 feet activity did not occur: Safety/medical concerns  Assist level: 2 helpers (+2 for safety; mod A)     Walk 50 feet activity   Assist Walk 50 feet with 2 turns activity did not occur: Safety/medical concerns  Assist level: 2 helpers Assistive device: Walker-Eva    Walk 150 feet activity   Assist Walk 150 feet activity did not occur: Safety/medical concerns  Assist level: 2 helpers      Walk 10 feet on uneven surface  activity   Assist Walk 10 feet on uneven surfaces activity did not occur: Safety/medical concerns         Wheelchair     Assist Is the patient using a wheelchair?: Yes Type of Wheelchair: Power    Wheelchair assist level: Independent Max wheelchair distance: 150    Wheelchair 50 feet with 2 turns activity    Assist        Assist Level: Independent   Wheelchair 150 feet activity     Assist      Assist Level: Independent   Blood pressure 105/71, pulse 98, temperature 99.1 F (37.3 C), resp. rate 17, height 6\' 1"  (1.854 m), weight 94.9 kg, SpO2 98%.  Medical Problem List and Plan: 1. 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  Making gains in feeding  himself 2.  Antithrombotics: -DVT/anticoagulation:  Mechanical: Sequential compression devices, below knee Bilateral lower extremities             -antiplatelet therapy: N/A--no antiplatelet due to hx of recurrent GIB  4/25- d/w family- they agree that Lovenox  not appropriate at this time.   -06/09/23 ordered dopplers to r/o clots given low grade temps recently  4/28- LE Dopplers (-) for DVT 3. Pain Management:  oxycodone  prn. Gabapentin  300 mg/hs.              --has Flexeril  10 mg TID prn              - patient with intermittent, severe b/l headache - add topomax 25 mg BID    4/19- pt rating pain 07/19/08 which is "tolerable"  4/22- pain slightly better this AM  4/25- fewer complaints of pain -06/09/23 having more b/l knee pain and using percocets more-- could be causing confusion? Advised judicious use, might benefit from knee aspirations/injections? 4/28- less knee pain this AM 4/30- pain about the same- just started Prednisone  yesterday per Ortho Steffanie Edouard request 5/1- fewer complaints about knee pain 5/2- new pain is doing "well" on prednisone , hopefully does better once prednisone  is stopped after 3 days 4. Mood/Behavior/Sleep: LCSW to follow for evaluation and support             -antipsychotic agents: N/A             --melatonin prn for insomnia.  -06/09/23 poor sleep, d/t interruptions, asked nursing to minimize interruptions between 11p-5a (apparently they can't put a sign up, but will communicate in huddle) 5. Neuropsych/cognition: This patient is capable of making decisions on his own behalf. -06/09/23 son concerned with confusion, could be from poor sleep/pain meds vs cefepime  toxicity; getting head CT, dopplers, consulted ID Dr. Levern Reader as below 4/28- cognitively intact this AM 4/30- 5/3-doing well cognitively and brighter affect 6. Skin/Wound Care: Routine pressure relief measures.   4/19- due to ulcers on legs, on low air loss mattress 7. Fluids/Electrolytes/Nutrition:  Strict I/O. Daily weights. Labs with HD. 8. ESRD- HD MWF: Schedule HD at the end of the day to help with tolerance of therapy             --Sensipar  and Renvela  for metabolic bone disease.   - Patient reports no longer makes urine  4/20- pt getting HD this AM- last HD was Wednesday.   4/21 dialysis moved to tomorrow  4/22- HD to be done today  4/25- Next HD today to get back on track  4/28- HD today  4/30- supposed to have HD today- will ask they recheck labs  5/3- got HD yesterday 9. Anemia of chronic disease: Monitor H/H, aranesp   -4/21 HGB lower 7.5, FOBT, recheck tomorrow  4/22- HB 8.7 on today's check-con't to monitor  -06/09/23 hgb 8.5 yesterday  4/28- Hb down to 7.7- 4/29- will try to recheck Thursday  4/30- if they don't check in HD, will check tomorrow 5/1- Hb down to 7.3- is Je[employer]'s [employer], so doesn't want transfusions? 10. H/o recurrent GIB/J[employer]ovah's Witness: Continue Protonix  BID 11. H/o Hypotension: Has had drop  in BP with HD. Now cervical SCI.  --Monitor for symptoms day after HD and with activity. -4/18 baseline blood pressure stable, monitor with activity 4/19- BP actually slightly elevated today- 140s-150's- con't regimen, since can drop with HD and therapy 4/20- BP actually doing better today- in 100s'110s- however HR  elevated, likely to fluid overload 4/21 nephrology started midodrine  10mg  before dialysis, BP appears to be doing a little better today 4/22- less hypotensive this AM laying- will monitor for [facility] 4/23- Tachycardia slightly better- in high 90's- having some intermittent elevated BP's- will monitor 4/24- BP running 140-s150s, but don't want to drop it since having HD 4/25- Having Hypotension with therapy, so don't want to reduce BP at all- doing TED's and ACE wraps- occ elevated, but with HD and therapy, not appropriate to reduce levels 4/29-5/4-  BP controlled- softer on dialysis days but stable; con't regimen Vitals:   06/14/23 1600 06/14/23 1630  06/14/23 1700 06/14/23 1730  BP: 104/74 (!) 142/83 (!) 152/86 114/74   06/14/23 1800 06/14/23 1819 06/14/23 1830 06/14/23 1909  BP: 117/74 96/70 96/70  112/72   06/15/23 0554 06/15/23 1341 06/15/23 2010 06/16/23 0428  BP: 99/66 (!) 140/78 114/71 105/71     12. Constipation: Small BM on 04/14 --had of nulytey yesterday and another today? -Xray 4/17 without excessive stool, patient reports stool still little hard yesterday.  Will increase MiraLAX  to 3 times a day, continue Senokot twice a day. Suppository PRN   06/09/23 LBM yesterday, cont regimen 4/28- LBM overnight- has been refusing miralax - made it prn- taking metamucil  4/30- asking for Sorbitol  today since needs ot have bigger BM  5/1- ended up taking sorbitol  this AM- no result quite yet  06/16/23 LBM yesterday 13. ?left knee effusion. Not currently bothersome. Consider imaging.  14.  SIRS- Fever/hypotension/tachycardia - 4/21 Currently afebrile, he got to 50 cc IVF yesterday, CXR negative, EKG sinus tachycardia. Patient was started on Maxipime  and vancomycin  yesterday.  Appears to be doing much better today.  WBC not elevated at 7.1.  Continue to follow blood cultures 4/22- Blood Cx's (-) so far- WBC 7.3- will con't IV ABX until know final results of blood Cx's- CXR (-)- but temp was 103.2 yesterday overnight.  4/23- Blood C'x (-) x2 days so far- T 99.9 this Am after a few sips of coffee- was 100.4 this AM- will con't IV ABX since concerned about possible bacteremia- esp since having low grade temps still 4/24- No more fevers, even low grade- no growth on blood Cx' bu it says that didn't get enough blood, so much be falsely negative 4/25- No growth for 5 days, but it also said not enough blood to be sure was a true negative. Will change stop date til tomorrow -06/08/23 at 3am temp recorded as 100.3 but 5am was 98.3; will get CBC w/diff, monitor temps closely today, if continued low grade temps, will reach out to ID. BCx NGTD x5d.  Didn't get vanc yesterday? Or cefepime ? Unclear why. Cefepime  started this morning at 0011. -06/09/23 temp 100.3 at 3am yesterday and then 100 later in the day, but no fevers since, no tachycardia.  -WBC yesterday 8.9.  -Now with some confusion; spoke with Dr. Levern Reader of ID, stated cefepime  could be the cause of the confusion -recommended we stop all abx (which ended yesterday anyway) -watch the patient OFF abx -if fevers >100.3 occur then she recommended repeat BCx and CXR and could call her back to see if any abx would be advised at that time.  -Head CT and dopplers ordered as well, as above 4/28- CT looks good- no fever, WBC is OK- off ABX 4/29- Also Dopplers (-) feeling good this AM 06/16/23 seems to have resolved. 15. Tachycardia 4/22- doing better- right at 100 this AM- will monitor- likely can  be from dregs of infection vs OH. -will monitor  4/23- Tachycardia improving- 4/24-27 HR in 90's right now- doing better  4/29- 4/30- HR running in 90s-low 100's today 16. R hand swelling  4/23- doing ice- will do xrays 4/24- Xray doesn't look like cause of swelling, however could be due to new shunt? 4/30- resolved 17. Hypocalcemia 4/24- his Albumin  is 2.1, so probably ok, and phos coming down- will leave to renal- thank you for their help  5/1- Ca 9.3 18. Hyperkalemia 4/28- K+ 6.3 today- got hypoosmotic bath to reduce K+ per note from RN/HD nurse? Also has Na of 127- being addressed by HD/renal- Phos also 5.8- but again, getting HD today 5/1- K+ down to 5.6 and Na 129 19. R>L knee pain  4/29- will ask Ortho if they will tap his R knee- I have no problem doing injection of knee, however with so much fluid, I would rather Ortho tap him and then do steroid injection.   4/30- we added Prednisone  40 mg daily x 3 days- to see if helps knee pain  5/1- mild reduction in pain so far  5/2- knees feeling good!    LOS: 17 days A FACE TO FACE EVALUATION WAS PERFORMED  [address] 06/16/2023,  10:08 AM

## 2023-06-17 LAB — RENAL FUNCTION PANEL
Albumin: 2.3 g/dL — ABNORMAL LOW (ref 3.5–5.0)
Anion gap: 18 — ABNORMAL HIGH (ref 5–15)
BUN: 98 mg/dL — ABNORMAL HIGH (ref 8–23)
CO2: 21 mmol/L — ABNORMAL LOW (ref 22–32)
Calcium: 9.2 mg/dL (ref 8.9–10.3)
Chloride: 94 mmol/L — ABNORMAL LOW (ref 98–111)
Creatinine, Ser: 9.85 mg/dL — ABNORMAL HIGH (ref 0.61–1.24)
GFR, Estimated: 5 mL/min — ABNORMAL LOW (ref >60)
Glucose, Bld: 116 mg/dL — ABNORMAL HIGH (ref 70–99)
Phosphorus: 4.3 mg/dL (ref 2.5–4.6)
Potassium: 5.6 mmol/L — ABNORMAL HIGH (ref 3.5–5.1)
Sodium: 133 mmol/L — ABNORMAL LOW (ref 135–145)

## 2023-06-17 NOTE — Progress Notes (Signed)
 PROGRESS NOTE   Subjective/Complaints:  Pt reports  doing well; slept well!  Getting his R side moving more.  Got himself dressed this Am with a little help from therapy.   LBM overnight.   Likes soft collar much more with hard collar.  ROS:    Pt denies SOB, abd pain, CP, N/V/C/D, and vision changes  Objective:   No results found.     Recent Labs    06/14/23 1450  WBC 8.0  HGB 7.5*  HCT 23.6*  PLT 436*    Recent Labs    06/14/23 1326  NA 132*  K 5.2*  CL 92*  CO2 22  GLUCOSE 105*  BUN 88*  CREATININE 8.60*  CALCIUM  9.7     Intake/Output Summary (Last 24 hours) at 06/17/2023 0901 Last data filed at 06/17/2023 0731 Gross per 24 hour  Intake 1208 ml  Output --  Net 1208 ml        Physical Exam: Vital Signs Blood pressure 111/69, pulse 91, temperature 98.5 F (36.9 C), temperature source Oral, resp. rate 16, height 6\' 1"  (1.854 m), weight 94.9 kg, SpO2 100%.      General: awake, alert, appropriate, sitting EOB sitting up; NAD HENT: conjugate gaze; oropharynx moist-wearing soft collar- scrunched up neck CV: regular rate and rhythm; no JVD Pulmonary: CTA B/L; no W/R/R- good air movement GI: soft, NT, ND, (+)BS- hypoactive Psychiatric: appropriate- pleasant Neurological: Ox3  Skin: no evidence of cellulitis to extremities.   PRIOR EXAMS: MsK: B/L knees with effusions- less TTP on R knee this AM , no erythema, not really focally warm to touch- Both have effusions, - R hand swelling looks good/resolved  MSK- R hand swelling esp ulnar side of hand/dorsum and L wrist-stable this AM RUE- Biceps 4/5; WE 4-/5; Triceps 2/5; Grip 2-/5 and FA 1/5 LUE- 4/5 except triceps 3+ to 4-/5 RLE- HF 1 to 2-/5; KE 2-/5; DF/PF 2-/5 LLE- HF 3/5; KE 4-/5; DF/PF 4/5    Skin: C/D/I. Multiple lesions on BL legs with various stages of healing.  + RUE fistula - intact   MSK:      LLE knee with palpable  effusion, no warmth, no ttp Mood: Pleasant affect, appropriate mood.  Neurologic exam: Alert and oriented x 4, fluent, follows commands, cranial nerves II through XII grossly intact, no hypertonia noted  Sensation: Reduced to light touch in distal right lower extremity to the mid-thigh       Strength:                RUE: 2/5 SA, 2/5 EF, 2/5 EE, 2/5 WE, 2/5 FF, 2/5 FA                LUE:  3/5 SA, 3/5 EF, 3/5 EE, 3/5 WE, 3/5 FF, 3/5 FA                RLE: 2/5 HF, 2/5 KE, 2/5  DF, 2/5  EHL, 2/5  PF                 LLE:  3/5 HF, 4/5 KE, 4/5  DF, 4/5  EHL, 4/5  PF  Neuroexam stable 4/21  Assessment/Plan: 1. Functional deficits which require 3+ hours per day of interdisciplinary therapy in a comprehensive inpatient rehab setting. Physiatrist is providing close team supervision and 24 hour management of active medical problems listed below. Physiatrist and rehab team continue to assess barriers to discharge/monitor patient progress toward functional and medical goals  Care Tool:  Bathing    Body parts bathed by patient: Right arm, Chest, Abdomen, Right upper leg, Left upper leg, Face   Body parts bathed by helper: Left arm, Front perineal area, Buttocks, Right lower leg, Left lower leg     Bathing assist Assist Level: Moderate Assistance - Patient 50 - 74%     Upper Body Dressing/Undressing Upper body dressing   What is the patient wearing?: Pull over shirt    Upper body assist Assist Level: Minimal Assistance - Patient > 75%    Lower Body Dressing/Undressing Lower body dressing      What is the patient wearing?: Pants     Lower body assist Assist for lower body dressing: Moderate Assistance - Patient 50 - 74%     Toileting Toileting Toileting Activity did not occur Press photographer and hygiene only): N/A (no void or bm) (anuric, HD patient)  Toileting assist Assist for toileting: Maximal Assistance - Patient 25 - 49%     Transfers Chair/bed transfer  Transfers  assist     Chair/bed transfer assist level: Minimal Assistance - Patient > 75%     Locomotion Ambulation   Ambulation assist   Ambulation activity did not occur: Safety/medical concerns  Assist level: 2 helpers Assistive device: Blanca Bunch Max distance: 100'   Walk 10 feet activity   Assist  Walk 10 feet activity did not occur: Safety/medical concerns  Assist level: 2 helpers (+2 for safety; mod A)     Walk 50 feet activity   Assist Walk 50 feet with 2 turns activity did not occur: Safety/medical concerns  Assist level: 2 helpers Assistive device: Walker-Eva    Walk 150 feet activity   Assist Walk 150 feet activity did not occur: Safety/medical concerns  Assist level: 2 helpers      Walk 10 feet on uneven surface  activity   Assist Walk 10 feet on uneven surfaces activity did not occur: Safety/medical concerns         Wheelchair     Assist Is the patient using a wheelchair?: Yes Type of Wheelchair: Power    Wheelchair assist level: Independent Max wheelchair distance: 150    Wheelchair 50 feet with 2 turns activity    Assist        Assist Level: Independent   Wheelchair 150 feet activity     Assist      Assist Level: Independent   Blood pressure 111/69, pulse 91, temperature 98.5 F (36.9 C), temperature source Oral, resp. rate 16, height 6\' 1"  (1.854 m), weight 94.9 kg, SpO2 100%.  Medical Problem List and Plan: 1. 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  Making gains in feeding himself 2.  Antithrombotics: -DVT/anticoagulation:  Mechanical: Sequential compression devices, below knee Bilateral lower extremities             -antiplatelet therapy: N/A--no antiplatelet due to hx of recurrent GIB  4/25- d/w family- they agree that Lovenox  not appropriate at this time.   -06/09/23 ordered dopplers to r/o clots given low grade temps recently  4/28- LE Dopplers (-) for DVT 3. Pain Management:  oxycodone  prn. Gabapentin  300 mg/hs.              --has Flexeril  10 mg TID prn              - patient with intermittent, severe b/l headache - add topomax 25 mg BID    4/19- pt rating pain 07/19/08 which is "tolerable"  4/22- pain slightly better this AM  4/25- fewer complaints of pain -06/09/23 having more b/l knee pain and using percocets more-- could be causing confusion? Advised judicious use, might benefit from knee aspirations/injections? 4/28- less knee pain this AM 4/30- pain about the same- just started Prednisone  yesterday per Ortho Steffanie Edouard request 5/1- fewer complaints about knee pain 5/2- new pain is doing "well" on prednisone , hopefully does better once prednisone  is stopped after 3 days 5/5- denies knee pain this AM 4. Mood/Behavior/Sleep: LCSW to follow for evaluation and support             -antipsychotic agents: N/A             --melatonin prn for insomnia.  -06/09/23 poor sleep, d/t interruptions, asked nursing to minimize interruptions between 11p-5a (apparently they can't put a sign up, but will communicate in huddle) 5. Neuropsych/cognition: This patient is capable of making decisions on his own behalf. -06/09/23 son concerned with confusion, could be from poor sleep/pain meds vs cefepime  toxicity; getting head CT, dopplers, consulted ID Dr. Levern Reader as below 4/28- cognitively intact this AM 4/30- 5/3-doing  well cognitively and brighter affect 6. Skin/Wound Care: Routine pressure relief measures.   4/19- due to ulcers on legs, on low air loss mattress 7. Fluids/Electrolytes/Nutrition: Strict I/O. Daily weights. Labs with HD. 8. ESRD- HD MWF: Schedule HD at the end of the day to help with tolerance of therapy             --Sensipar  and Renvela  for metabolic bone disease.   - Patient reports no longer makes urine  4/20- pt getting HD this AM- last HD was Wednesday.   4/21 dialysis moved to tomorrow  4/22- HD to be done today  4/25- Next HD today to get back on track  4/28- HD today  4/30- supposed to have HD today- will ask they recheck labs  5/3- got HD yesterday  5/5- HD today- planned 9. Anemia of chronic disease: Monitor H/H, aranesp   -4/21 HGB lower 7.5, FOBT, recheck tomorrow  4/22- HB 8.7 on today's check-con't to monitor  -06/09/23 hgb 8.5 yesterday  4/28- Hb down to 7.7- 4/29- will try to recheck Thursday  4/30- if they don't check in HD, will check tomorrow 5/1- Hb down to 7.3- is Jehovah's Witness, so doesn't want transfusions? 10. H/o recurrent GIB/Jehovah's Witness: Continue Protonix  BID 11. H/o Hypotension: Has had drop  in BP with HD. Now cervical SCI.  --  Monitor for symptoms day after HD and with activity. -4/18 baseline blood pressure stable, monitor with activity 4/19- BP actually slightly elevated today- 140s-150's- con't regimen, since can drop with HD and therapy 4/20- BP actually doing better today- in 100s'110s- however HR elevated, likely to fluid overload 4/21 nephrology started midodrine  10mg  before dialysis, BP appears to be doing a little better today 4/22- less hypotensive this AM laying- will monitor for [name] 4/23- Tachycardia slightly better- in high 90's- having some intermittent elevated BP's- will monitor 4/24- BP running 140-s150s, but don't want to drop it since having HD 4/25- Having Hypotension with therapy, so don't want to reduce BP at all- doing TED's  and ACE wraps- occ elevated, but with HD and therapy, not appropriate to reduce levels 4/29-5/4-  BP controlled- softer on dialysis days but stable; con't regimen 5/5- BP controlled- con't regimen Vitals:   06/14/23 1730 06/14/23 1800 06/14/23 1819 06/14/23 1830  BP: 114/74 117/74 96/70 96/70    06/14/23 1909 06/15/23 0554 06/15/23 1341 06/15/23 2010  BP: 112/72 99/66 (!) 140/78 114/71   06/16/23 0428 06/16/23 1300 06/16/23 2011 06/17/23 0546  BP: 105/71 108/69 125/81 111/69     12. Constipation: Small BM on 04/14 --had of nulytey yesterday and another today? -Xray 4/17 without excessive stool, patient reports stool still little hard yesterday.  Will increase MiraLAX  to 3 times a day, continue Senokot twice a day. Suppository PRN   06/09/23 LBM yesterday, cont regimen 4/28- LBM overnight- has been refusing miralax - made it prn- taking metamucil  4/30- asking for Sorbitol  today since needs ot have bigger BM  5/1- ended up taking sorbitol  this AM- no result quite yet  06/16/23 LBM yesterday  5/5- LBM yesterday/overnight 13. ?left knee effusion. Not currently bothersome. Consider imaging.  14.  SIRS- Fever/hypotension/tachycardia - 4/21 Currently afebrile, he got to 50 cc IVF yesterday, CXR negative, EKG sinus tachycardia. Patient was started on Maxipime  and vancomycin  yesterday.  Appears to be doing much better today.  WBC not elevated at 7.1.  Continue to follow blood cultures 4/22- Blood Cx's (-) so far- WBC 7.3- will con't IV ABX until know final results of blood Cx's- CXR (-)- but temp was 103.2 yesterday overnight.  4/23- Blood C'x (-) x2 days so far- T 99.9 this Am after a few sips of coffee- was 100.4 this AM- will con't IV ABX since concerned about possible bacteremia- esp since having low grade temps still 4/24- No more fevers, even low grade- no growth on blood Cx' bu it says that didn't get enough blood, so much be falsely negative 4/25- No growth for 5 days, but it also said  not enough blood to be sure was a true negative. Will change stop date til tomorrow -06/08/23 at 3am temp recorded as 100.3 but 5am was 98.3; will get CBC w/diff, monitor temps closely today, if continued low grade temps, will reach out to ID. BCx NGTD x5d. Didn't get vanc yesterday? Or cefepime ? Unclear why. Cefepime  started this morning at 0011. -06/09/23 temp 100.3 at 3am yesterday and then 100 later in the day, but no fevers since, no tachycardia.  -WBC yesterday 8.9.  -Now with some confusion; spoke with Dr. Levern Reader of ID, stated cefepime  could be the cause of the confusion -recommended we stop all abx (which ended yesterday anyway) -watch the patient OFF abx -if fevers >100.3 occur then she recommended repeat BCx and CXR and could call her back to see if any abx would be advised at  that time.  -Head CT and dopplers ordered as well, as above 4/28- CT looks good- no fever, WBC is OK- off ABX 4/29- Also Dopplers (-) feeling good this AM 06/16/23 seems to have resolved. 15. Tachycardia 4/22- doing better- right at 100 this AM- will monitor- likely can be from dregs of infection vs OH. -will monitor  4/23- Tachycardia improving- 4/24-27 HR in 90's right now- doing better  4/29- 4/30- HR running in 90s-low 100's today 16. R hand swelling  4/23- doing ice- will do xrays 4/24- Xray doesn't look like cause of swelling, however could be due to new shunt? 4/30- resolved 17. Hypocalcemia 4/24- his Albumin  is 2.1, so probably ok, and phos coming down- will leave to renal- thank you for their help  5/1- Ca 9.3 18. Hyperkalemia 4/28- K+ 6.3 today- got hypoosmotic bath to reduce K+ per note from RN/HD nurse? Also has Na of 127- being addressed by HD/renal- Phos also 5.8- but again, getting HD today 5/1- K+ down to 5.6 and Na 129 19. R>L knee pain  4/29- will ask Ortho if they will tap his R knee- I have no problem doing injection of knee, however with so much fluid, I would rather Ortho tap him and then  do steroid injection.   4/30- we added Prednisone  40 mg daily x 3 days- to see if helps knee pain  5/1- mild reduction in pain so far  5/2- knees feeling good!  5/5- knee pain well controlled     LOS: 18 days A FACE TO FACE EVALUATION WAS PERFORMED  [name] 06/17/2023, 9:01 AM

## 2023-06-17 NOTE — Plan of Care (Signed)
  Problem: Consults Goal: RH SPINAL CORD INJURY PATIENT EDUCATION Description:  See Patient Education module for education specifics.  Outcome: Progressing   Problem: SCI BOWEL ELIMINATION Goal: RH STG MANAGE BOWEL WITH ASSISTANCE Description: STG Manage Bowel with minimal Assistance. Outcome: Progressing Goal: RH STG SCI MANAGE BOWEL WITH MEDICATION WITH ASSISTANCE Description: STG SCI Manage bowel with medication with assistance. Outcome: Progressing   Problem: RH SKIN INTEGRITY Goal: RH STG SKIN FREE OF INFECTION/BREAKDOWN Description: Manage skin free of infection/breakdown with minimal assistance Outcome: Progressing   Problem: RH SAFETY Goal: RH STG ADHERE TO SAFETY PRECAUTIONS W/ASSISTANCE/DEVICE Description: STG Adhere to Safety Precautions With minimal Assistance/Device. Outcome: Progressing   Problem: RH PAIN MANAGEMENT Goal: RH STG PAIN MANAGED AT OR BELOW PT'S PAIN GOAL Description: Pain<4 w/ prns Outcome: Progressing   Problem: RH KNOWLEDGE DEFICIT SCI Goal: RH STG INCREASE KNOWLEDGE OF SELF CARE AFTER SCI Description: Manage increase knowledge of self care after SCI with minimal assistance using educational materials provided to son Outcome: Progressing

## 2023-06-17 NOTE — Progress Notes (Signed)
 Physical Therapy Session Note  Patient Details  Name: Carl Haxton Sr. MRN: [id number] Date of Birth: 29-Sep-1960  Today's Date: 06/17/2023 PT Individual Time: 0930-1015 PT Individual Time Calculation (min): 45 min   Short Term Goals: Week 2:  PT Short Term Goal 1 (Week 2): Pt will progress transfers to mod assist PT Short Term Goal 1 - Progress (Week 2): Met PT Short Term Goal 2 (Week 2): Pt will tolerate standing with lift equipment x 5 min preparation for gait PT Short Term Goal 2 - Progress (Week 2): Met PT Short Term Goal 3 (Week 2): Pt will be able to perform bed mobilty with min assist for supine <> sit PT Short Term Goal 3 - Progress (Week 2): Progressing toward goal  Skilled Therapeutic Interventions/Progress Updates:   Session focused on PWC mobility on unit and in tight spaces navigating obstacles, NMR for sit <> stands and gait, and UE strengthening using UBE.   NMR during sit <> stands with Marlea Silvers walker from elevated chair with mod +2, decreased power up noted today with cues for technique and facilitation for weightshift. Adjusted height once in standing. Repeated x 2 trials. Gait x 15' with mod +2 with decreased knee extension in R, cues for upright trunk, and assist with steering of Blanca Bunch - overall, required increased assist from last time on Friday when this therapist worked on gait.  Pt reporting just not feeling as stable today.  UBE on level 1 x 3 min 50 sec for functional UE strengthening, ROM, and cardiovascular endurance - limited endurance with rest breaks needed.   PWC mobility on unit and adapting positioning mod I on unit.    Therapy Documentation Precautions:  Precautions Precautions: Fall, Cervical, Other (comment) (HD MWF (RUE restricted).) Recall of Precautions/Restrictions: Impaired Precaution/Restrictions Comments: Verbally reviewed cervical precautions. Required Braces or Orthoses: Cervical Brace Cervical Brace: Hard collar Restrictions Weight  Bearing Restrictions Per Provider Order: No  Pain: Reports pain R knee and overall fatigue today. Rest breaks and donned bilateral knee braces for pain management.      Therapy/Group: Individual Therapy  Gita Lamb Amadeo June, PT, DPT, CBIS  06/17/2023, 11:06 AM

## 2023-06-17 NOTE — Progress Notes (Signed)
 [name] Progress Note   Subjective:    Seen and examined patient in his room. Currently in his wheelchair. He denies any acute issues or concerns. Plan for HD this afternoon.  Objective Vitals:   06/16/23 0432 06/16/23 1300 06/16/23 2011 06/17/23 0546  BP:  108/69 125/81 111/69  Pulse:  93 94 91  Resp:  18 16 16   Temp:  98.7 F (37.1 C) 98.8 F (37.1 C) 98.5 F (36.9 C)  TempSrc:  Oral  Oral  SpO2:  100% 100% 100%  Weight: 94.9 kg     Height:       Physical Exam General: Alert male in NAD; on RA Heart: RRR, no murmurs, rubs or gallops Lungs: CTA bilaterally, respirations unlabored Abdomen: Soft, non-distended, +BS Extremities: trace edema, wearing compression stockings Dialysis Access:  AVF + t/b  Filed Weights   06/14/23 1502 06/14/23 1840 06/16/23 0432  Weight: 90.6 kg 87.6 kg 94.9 kg    Intake/Output Summary (Last 24 hours) at 06/17/2023 1116 Last data filed at 06/17/2023 0731 Gross per 24 hour  Intake 1208 ml  Output --  Net 1208 ml    Additional Objective Labs: Basic Metabolic Panel: Recent Labs  Lab 06/10/23 1416 06/12/23 1516 06/14/23 1326  NA 127* 129* 132*  K 6.3* 5.6* 5.2*  CL 90* 91* 92*  CO2 20* 22 22  GLUCOSE 133* 121* 105*  BUN 97* 88* 88*  CREATININE 12.41* 9.88* 8.60*  CALCIUM  9.1 9.3 9.7  PHOS 5.8*  --  4.4   Liver Function Tests: Recent Labs  Lab 06/10/23 1416 06/14/23 1326  ALBUMIN  2.0* 2.5*   No results for input(s): "LIPASE", "AMYLASE" in the last 168 hours. CBC: Recent Labs  Lab 06/10/23 1416 06/12/23 1516 06/14/23 1450  WBC 9.3 6.3 8.0  NEUTROABS  --  4.4  --   HGB 7.7* 7.3* 7.5*  HCT 24.5* 22.6* 23.6*  MCV 90.4 90.0 90.1  PLT 325 370 436*   Blood Culture    Component Value Date/Time   SDES BLOOD LEFT HAND 06/02/2023 1743   SDES BLOOD LEFT HAND 06/02/2023 1743   SPECREQUEST  06/02/2023 1743    BOTTLES DRAWN AEROBIC AND ANAEROBIC Blood Culture results may not be optimal due to an inadequate  volume of blood received in culture bottles   SPECREQUEST  06/02/2023 1743    AEROBIC BOTTLE ONLY Blood Culture results may not be optimal due to an inadequate volume of blood received in culture bottles   CULT  06/02/2023 1743    NO GROWTH 5 DAYS Performed at [facility], [address], [place], [place] [address]    CULT  06/02/2023 1743    NO GROWTH 5 DAYS Performed at [facility], [address] [place], [place] [address]    REPTSTATUS 06/07/2023 FINAL 06/02/2023 1743   REPTSTATUS 06/07/2023 FINAL 06/02/2023 1743    Cardiac Enzymes: No results for input(s): "CKTOTAL", "CKMB", "CKMBINDEX", "TROPONINI" in the last 168 hours. CBG: Recent Labs  Lab 06/10/23 1149 06/10/23 2037 06/11/23 0504 06/11/23 1127 06/11/23 1614  GLUCAP 103* 143* 91 107* 120*   Iron Studies: No results for input(s): "IRON", "TIBC", "TRANSFERRIN", "FERRITIN" in the last 72 hours. Lab Results  Component Value Date   INR 1.1 05/27/2023   INR 1.1 05/25/2023   INR 1.1 09/17/2021   Studies/Results: No results found.  Medications:   (feeding supplement) PROSource Plus  30 mL Oral BID BM   atorvastatin   10 mg Oral Q Thu  carbamide peroxide  5 drop Right EAR BID   Chlorhexidine  Gluconate Cloth  6 each Topical Q0600   Chlorhexidine  Gluconate Cloth  6 each Topical Q0600   Chlorhexidine  Gluconate Cloth  6 each Topical Q0600   Chlorhexidine  Gluconate Cloth  6 each Topical Q0600   cinacalcet   120 mg Oral Q supper   darbepoetin (ARANESP ) injection - DIALYSIS  150 mcg Subcutaneous Q Tue-1800   gabapentin   300 mg Oral QHS   Gerhardt's butt cream   Topical BID   pantoprazole   40 mg Oral BID AC   psyllium  1 packet Oral TID   senna-docusate  2 tablet Oral BID   sevelamer  carbonate  3,200 mg Oral TID WC   topiramate   25 mg Oral BID    Dialysis Orders: OP Dialysis Orders [facility] - MWF 4h  B400   85.5kg   2K bath  AVF   - Heparin  3000 units IV three times per week  - Mircera 60mcg IV q 2 weeks-  last dose 05/24/23  - Calcitriol  1.5mcg PO q HD  Assessment/Plan: 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 Now in CIR ESRD: Off schedule last week, has resumed MWF schedule, K+ running high, reinforce low K diet. Last K+ 5.2. HTN/volume: BP controlled/stable, still above EDW with some edema on exam, push UFG as tolerated Anemia of ESRD: Hgb 7.3, aranesp  dose recently increased Secondary HPTH: CorrCa >10, will hold calcitriol  for now/Phos 5.8- continue sevelamer  and sensipar  Nutrition: Alb 2.0, continue protein supplements  [name], NP [name] 06/17/2023,11:16 AM  LOS: 18 days

## 2023-06-17 NOTE — Progress Notes (Addendum)
 Occupational Therapy Session Note  Patient Details  Name: Carl Sanangelo Sr. MRN: [id number] Date of Birth: December 03, 1960  Today's Date: 06/17/2023 OT Individual Time:0800-0914 & 1045-1200 OT Individual Time Calculation (min): 75 min & 75 min   Short Term Goals: Week 3:  OT Short Term Goal 1 (Week 3): Pt will complete LB dressing with mod A STS from w/c or EOB OT Short Term Goal 2 (Week 3): Pt will complete toileting tasks with max A OT Short Term Goal 3 (Week 3): Pt will complete bathing tasks with mod A  Skilled Therapeutic Interventions/Progress Updates:      Therapy Documentation Precautions:  Precautions Precautions: Fall, Cervical, Other (comment) (HD MWF (RUE restricted).) Recall of Precautions/Restrictions: Impaired Precaution/Restrictions Comments: Verbally reviewed cervical precautions. Required Braces or Orthoses: Cervical Brace Cervical Brace: Hard collar Restrictions Weight Bearing Restrictions Per Provider Order: No Session 1 General: "This knee is acting up" Pt seated on EOB upon OT arrival, agreeable to OT.  Pain:  unrated pain reported in Rt knee, activity, intermittent rest breaks, distractions provided for pain management, pt reports tolerable to proceed.   Exercises: OT prividing skilled intervention for transfer training and increased weight bearing through BLE for increased strength for ADL preparation. Pt completed 5x sit to stand with Max A +2 to stand and CGA-Min A for static standing at PFRW. Pt completed from raised bed><PFRW, VC required for controlled decent into sitting. OT then providing assistance for stand pivot transfer from EOB>W/C with PFRW at Mod A +2, VC required for upright posture.   Pt completed 10 minutes of arm bike, 5 min forward, 5 min backward in order to increase BUE/BLEstrength and endurance in preparation for increased independence in ADLs such as bathing. Rest break after 5 min, on initially level 4, turned down to 1 for backward  pedaling d/t increased difficulty resistance.   Other Treatments: OT adjusting PFRW for increased positioning for upright posture while standing in RW. OT also adjusted arm length for platform d/t pt increased arm length   Pt seated in W/C at end of session with W/C alarm donned, call light within reach and 4Ps assessed.    Session 2 General: "I hope I see you tomorrow!" Pt seated in W/C upon OT arrival, agreeable to OT.  Pain:  unrated pain reported in Rt knee, activity, intermittent rest breaks, distractions provided for pain management, pt reports tolerable to proceed.   ADL: OT providing skilled intervention on ADL retraining in order to increase independence with tasks and increase activity tolerance. Pt completed the following tasks at the current level of assist: Toilet transfer: stedy +1 transfer to toilet  Toileting: Max A +1, pt standing at stedy with OT providing assistance with pants management and hygiene LB dressing: Mod A, pt able to doff brief, OT providing assistance to don clean brief  Exercises: OT providing skilled intervention for the following exercise circuit in order to improve functional activity, strength and endurance to prepare for ADLs such as bathing. Pt completed the following exercises in seated/standing position with no noted LOB/SOB and 1x10 repetitions on each exercise. Circuit held outdoors for increased mood and QoL: -leg raises with BLE (individually and simultaneously) -raising leg into figure 4 for LB dressing prep -BUE arm raises (able to raise ~ 40*) -sit to stands with arm rail, Max A +1 VC required for attention to task  Other treatments: OT providing skilled intervention for community mobility with PWC in order to prepare pt for community integration at D/C. Pt  requiring no rest breaks . Pt practices maneuvering into elevators, pulling into table tops, maneuvering around obstacles and going advanced distances, and maneuvering on uneven terrain,  all completed at SBA.    Pt seated on commode at end of session with verbal directions to call nurse when done toileting and pull string, primary nurse aware of pt location,   Therapy/Group: Individual Therapy  [name], OTD, OTR/L 06/17/2023, 12:34 PM

## 2023-06-18 DIAGNOSIS — R41844 Frontal lobe and executive function deficit: Secondary | ICD-10-CM

## 2023-06-18 LAB — CBC WITH DIFFERENTIAL/PLATELET
Abs Immature Granulocytes: 0.05 10*3/uL (ref 0.00–0.07)
Basophils Absolute: 0.1 10*3/uL (ref 0.0–0.1)
Basophils Relative: 2 %
Eosinophils Absolute: 0.6 10*3/uL — ABNORMAL HIGH (ref 0.0–0.5)
Eosinophils Relative: 8 %
HCT: 22.7 % — ABNORMAL LOW (ref 39.0–52.0)
Hemoglobin: 7.3 g/dL — ABNORMAL LOW (ref 13.0–17.0)
Immature Granulocytes: 1 %
Lymphocytes Relative: 9 %
Lymphs Abs: 0.7 10*3/uL (ref 0.7–4.0)
MCH: 28.7 pg (ref 26.0–34.0)
MCHC: 32.2 g/dL (ref 30.0–36.0)
MCV: 89.4 fL (ref 80.0–100.0)
Monocytes Absolute: 1.4 10*3/uL — ABNORMAL HIGH (ref 0.1–1.0)
Monocytes Relative: 17 %
Neutro Abs: 5 10*3/uL (ref 1.7–7.7)
Neutrophils Relative %: 63 %
Platelets: 396 10*3/uL (ref 150–400)
RBC: 2.54 MIL/uL — ABNORMAL LOW (ref 4.22–5.81)
RDW: 15.7 % — ABNORMAL HIGH (ref 11.5–15.5)
WBC: 7.8 10*3/uL (ref 4.0–10.5)
nRBC: 0 % (ref 0.0–0.2)

## 2023-06-18 LAB — RENAL FUNCTION PANEL
Albumin: 2 g/dL — ABNORMAL LOW (ref 3.5–5.0)
Anion gap: 13 (ref 5–15)
BUN: 56 mg/dL — ABNORMAL HIGH (ref 8–23)
CO2: 26 mmol/L (ref 22–32)
Calcium: 8.9 mg/dL (ref 8.9–10.3)
Chloride: 94 mmol/L — ABNORMAL LOW (ref 98–111)
Creatinine, Ser: 6.55 mg/dL — ABNORMAL HIGH (ref 0.61–1.24)
GFR, Estimated: 9 mL/min — ABNORMAL LOW (ref >60)
Glucose, Bld: 85 mg/dL (ref 70–99)
Phosphorus: 3.9 mg/dL (ref 2.5–4.6)
Potassium: 4.7 mmol/L (ref 3.5–5.1)
Sodium: 133 mmol/L — ABNORMAL LOW (ref 135–145)

## 2023-06-18 LAB — SYNOVIAL CELL COUNT + DIFF, W/ CRYSTALS
Crystals, Fluid: NONE SEEN
Eosinophils-Synovial: 0 % (ref 0–1)
Lymphocytes-Synovial Fld: 0 % (ref 0–20)
Monocyte-Macrophage-Synovial Fluid: 12 % — ABNORMAL LOW (ref 50–90)
Neutrophil, Synovial: 88 % — ABNORMAL HIGH (ref 0–25)
WBC, Synovial: 42075 /mm3 — ABNORMAL HIGH (ref 0–200)

## 2023-06-18 MED ORDER — METHYLPREDNISOLONE ACETATE 40 MG/ML IJ SUSP
40.0000 mg | Freq: Once | INTRAMUSCULAR | Status: AC
  Administered 2023-06-18: 40 mg via INTRA_ARTICULAR
  Filled 2023-06-18: qty 1

## 2023-06-18 MED ORDER — BUPIVACAINE HCL (PF) 0.5 % IJ SOLN
10.0000 mL | Freq: Once | INTRAMUSCULAR | Status: AC
  Administered 2023-06-18: 10 mL
  Filled 2023-06-18: qty 10

## 2023-06-18 MED ORDER — CHLORHEXIDINE GLUCONATE CLOTH 2 % EX PADS
6.0000 | MEDICATED_PAD | Freq: Every day | CUTANEOUS | Status: DC
  Administered 2023-06-19: 6 via TOPICAL

## 2023-06-18 NOTE — Plan of Care (Signed)
  Problem: Consults Goal: RH SPINAL CORD INJURY PATIENT EDUCATION Description:  See Patient Education module for education specifics.  Outcome: Progressing   Problem: SCI BOWEL ELIMINATION Goal: RH STG MANAGE BOWEL WITH ASSISTANCE Description: STG Manage Bowel with minimal Assistance. Outcome: Progressing Goal: RH STG SCI MANAGE BOWEL WITH MEDICATION WITH ASSISTANCE Description: STG SCI Manage bowel with medication with assistance. Outcome: Progressing   Problem: RH SKIN INTEGRITY Goal: RH STG SKIN FREE OF INFECTION/BREAKDOWN Description: Manage skin free of infection/breakdown with minimal assistance Outcome: Progressing   Problem: RH KNOWLEDGE DEFICIT SCI Goal: RH STG INCREASE KNOWLEDGE OF SELF CARE AFTER SCI Description: Manage increase knowledge of self care after SCI with minimal assistance using educational materials provided to son Outcome: Progressing

## 2023-06-18 NOTE — Plan of Care (Signed)
  Problem: Consults Goal: RH SPINAL CORD INJURY PATIENT EDUCATION Description:  See Patient Education module for education specifics.  Outcome: Progressing   Problem: SCI BOWEL ELIMINATION Goal: RH STG MANAGE BOWEL WITH ASSISTANCE Description: STG Manage Bowel with minimal Assistance. Outcome: Progressing   Problem: SCI BOWEL ELIMINATION Goal: RH STG SCI MANAGE BOWEL WITH MEDICATION WITH ASSISTANCE Description: STG SCI Manage bowel with medication with assistance. Outcome: Progressing   Problem: RH SKIN INTEGRITY Goal: RH STG SKIN FREE OF INFECTION/BREAKDOWN Description: Manage skin free of infection/breakdown with minimal assistance Outcome: Progressing   Problem: RH SAFETY Goal: RH STG ADHERE TO SAFETY PRECAUTIONS W/ASSISTANCE/DEVICE Description: STG Adhere to Safety Precautions With minimal Assistance/Device. Outcome: Progressing   Problem: RH PAIN MANAGEMENT Goal: RH STG PAIN MANAGED AT OR BELOW PT'S PAIN GOAL Description: Pain<4 w/ prns Outcome: Progressing

## 2023-06-18 NOTE — Progress Notes (Addendum)
 [facility] Progress Note   Subjective:    Seen and examined patient in his room. Currently in his wheelchair about to start PT. He reports feeling well. Denies SOB, CP, and N/V. Current Hgb 7.3. Next HD 5/7.  Objective Vitals:   06/17/23 1730 06/17/23 2047 06/18/23 0539 06/18/23 0545  BP: 135/84 (!) 146/82  132/76  Pulse: (!) 102 97  92  Resp: 17 18  17   Temp:  98.4 F (36.9 C)  98.5 F (36.9 C)  TempSrc:  Oral  Oral  SpO2: 99% 100%  99%  Weight:   96 kg   Height:       Physical Exam General: Alert male in NAD; on RA Heart: RRR, no murmurs, rubs or gallops Lungs: CTA bilaterally, respirations unlabored Abdomen: Soft, non-distended, +BS Extremities: trace edema, wearing compression stockings Dialysis Access:  AVF + t/b  Filed Weights   06/16/23 0432 06/17/23 1315 06/18/23 0539  Weight: 94.9 kg 95.3 kg 96 kg    Intake/Output Summary (Last 24 hours) at 06/18/2023 1112 Last data filed at 06/17/2023 1941 Gross per 24 hour  Intake 360 ml  Output 3500 ml  Net -3140 ml    Additional Objective Labs: Basic Metabolic Panel: Recent Labs  Lab 06/14/23 1326 06/17/23 1400 06/18/23 0516  NA 132* 133* 133*  K 5.2* 5.6* 4.7  CL 92* 94* 94*  CO2 22 21* 26  GLUCOSE 105* 116* 85  BUN 88* 98* 56*  CREATININE 8.60* 9.85* 6.55*  CALCIUM  9.7 9.2 8.9  PHOS 4.4 4.3 3.9   Liver Function Tests: Recent Labs  Lab 06/14/23 1326 06/17/23 1400 06/18/23 0516  ALBUMIN  2.5* 2.3* 2.0*   No results for input(s): "LIPASE", "AMYLASE" in the last 168 hours. CBC: Recent Labs  Lab 06/12/23 1516 06/14/23 1450 06/18/23 0516  WBC 6.3 8.0 7.8  NEUTROABS 4.4  --  5.0  HGB 7.3* 7.5* 7.3*  HCT 22.6* 23.6* 22.7*  MCV 90.0 90.1 89.4  PLT 370 436* 396   Blood Culture    Component Value Date/Time   SDES BLOOD LEFT HAND 06/02/2023 1743   SDES BLOOD LEFT HAND 06/02/2023 1743   SPECREQUEST  06/02/2023 1743    BOTTLES DRAWN AEROBIC AND ANAEROBIC Blood Culture results may not  be optimal due to an inadequate volume of blood received in culture bottles   SPECREQUEST  06/02/2023 1743    AEROBIC BOTTLE ONLY Blood Culture results may not be optimal due to an inadequate volume of blood received in culture bottles   CULT  06/02/2023 1743    NO GROWTH 5 DAYS Performed at [facility], [address], [place], [place] [address]    CULT  06/02/2023 1743    NO GROWTH 5 DAYS Performed at [facility], [address], [place], [place] [address]    REPTSTATUS 06/07/2023 FINAL 06/02/2023 1743   REPTSTATUS 06/07/2023 FINAL 06/02/2023 1743    Cardiac Enzymes: No results for input(s): "CKTOTAL", "CKMB", "CKMBINDEX", "TROPONINI" in the last 168 hours. CBG: Recent Labs  Lab 06/11/23 1127 06/11/23 1614  GLUCAP 107* 120*   Iron Studies: No results for input(s): "IRON", "TIBC", "TRANSFERRIN", "FERRITIN" in the last 72 hours. Lab Results  Component Value Date   INR 1.1 05/27/2023   INR 1.1 05/25/2023   INR 1.1 09/17/2021   Studies/Results: No results found.  Medications:   (feeding supplement) PROSource Plus  30 mL Oral BID BM   atorvastatin   10 mg Oral Q Thu   carbamide  peroxide  5 drop Right EAR BID   Chlorhexidine  Gluconate Cloth  6 each Topical Q0600   Chlorhexidine  Gluconate Cloth  6 each Topical Q0600   Chlorhexidine  Gluconate Cloth  6 each Topical Q0600   Chlorhexidine  Gluconate Cloth  6 each Topical Q0600   cinacalcet   120 mg Oral Q supper   darbepoetin (ARANESP ) injection - DIALYSIS  150 mcg Subcutaneous Q Tue-1800   gabapentin   300 mg Oral QHS   Gerhardt's butt cream   Topical BID   pantoprazole   40 mg Oral BID AC   psyllium  1 packet Oral TID   senna-docusate  2 tablet Oral BID   sevelamer  carbonate  3,200 mg Oral TID WC   topiramate   25 mg Oral BID    Dialysis Orders: OP Dialysis Orders [facility] - MWF 4h  B400   85.5kg   2K bath  AVF   - Heparin  3000 units IV three times per week  - Mircera 60mcg IV q 2 weeks- last dose 05/24/23  -  Calcitriol  1.5mcg PO q HD  Assessment/Plan: 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 Now in CIR ESRD: Now back on MWF schedule. K+ now at goal after HD. Next HD 5/7. 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 It was noted in our outpatient records of no blood products except in emergent situations. Secondary HPTH: CorrCa >10, calcitriol  on hold for now/Phos now at goal, continue sevelamer  and sensipar  Nutrition: Alb 2.0, continue protein supplements  [name], NP [facility] 06/18/2023,11:12 AM  LOS: 19 days

## 2023-06-18 NOTE — Progress Notes (Signed)
 Physical Therapy Session Note  Patient Details  Name: Carl Swan Sr. MRN: [id number] Date of Birth: 12-Jun-1960  Today's Date: 06/18/2023 PT Individual Time: 1000-1100 PT Individual Time Calculation (min): 60 min   Short Term Goals: Week 2:  PT Short Term Goal 1 (Week 2): Pt will progress transfers to mod assist PT Short Term Goal 1 - Progress (Week 2): Met PT Short Term Goal 2 (Week 2): Pt will tolerate standing with lift equipment x 5 min preparation for gait PT Short Term Goal 2 - Progress (Week 2): Met PT Short Term Goal 3 (Week 2): Pt will be able to perform bed mobilty with min assist for supine <> sit PT Short Term Goal 3 - Progress (Week 2): Progressing toward goal Week 3:  PT Short Term Goal 1 (Week 3): Pt will perform bed mobility with min assist PT Short Term Goal 2 (Week 3): Pt will perform sit <> stands with mod A PT Short Term Goal 3 (Week 3): Pt will be able to gait x 150' with mod assist PT Short Term Goal 4 (Week 3): Pt will be able to independently perform and verbalize pressure relief techniques with PWC  Skilled Therapeutic Interventions/Progress Updates:    Pt presents up in PWC, stating he still feels "tired and worn down" today and frustrated with it impacting his progress. Focused on slideboard transfer and set up with PWC , pt able to set it up independently but requires assist with board placement and CGA for balance x 3 in session. Pt with good carryover of technique. NMR for sit <> stands from mat table x 2 with focus on standing balance, R knee activation, marching in place and then progressed to stand step transfer with Blanca Bunch - requires +2 assist for stability with Eva walker and mod to max +2 for sit <.> stand. Pt difficulty with R knee extension in standing, using his compression sleeve and may benefit from more supportive knee support for further standing/gait training. Transferred to Nustep via slideboard and focused on BUE/BLE strengthening and  reciprocal movement pattern retraining x 5 min on level 1. Left up in w/c with all needs in reach.   Therapy Documentation Precautions:  Precautions Precautions: Fall, Cervical, Other (comment) (HD MWF (RUE restricted).) Recall of Precautions/Restrictions: Impaired Precaution/Restrictions Comments: Verbally reviewed cervical precautions. Required Braces or Orthoses: Cervical Brace Cervical Brace: Hard collar Restrictions Weight Bearing Restrictions Per Provider Order: No    Pain:  Reports R knee is still bothering him. He is also reporting generalized fatigue and discussed lab values with MD this morning per his report.      Therapy/Group: Individual Therapy  Gita Lamb Amadeo June, PT, DPT, CBIS  06/18/2023, 11:10 AM

## 2023-06-18 NOTE — Progress Notes (Signed)
 Patient ID: Carl Rachow Sr., male   DOB: [birth year], 63 y.o.   MRN: [id number] Met with pt to give team conference update regarding progress this week. Pt was doing well until his knees's starting hurting again ad he has declined since Monday. Aware MD contacting ortho to be consulted again to assist with injecting knee's. Will touch base with son to give update. Aware son touring SNF's to look at facilities.

## 2023-06-18 NOTE — Consult Note (Signed)
 Reason for Consult:Right knee pain Referring Physician: Celia Coles Time called: 1217 Time at bedside: 1335   Carl Pray Sr. is an 63 y.o. male.  HPI: [name] was admitted to CIR 2w ago. He's been making progress but has been hampered by knee pain. He has a long hx/o OA and has had his knees injected in the remote past by Dr. Jonell Neptune. He was placed on oral steroids last week which helped some but the pain has come back and orthopedic surgery was consulted for knee injection.  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    Parathyroid  abnormality (HCC)    ectopic parathyroid  gland   Presence of arteriovenous fistula for hemodialysis, primary (HCC)    RUE PER PT RLE   Refusal of blood product    NO WHOLE BLOOD PROUCTS   Renal cell carcinoma (HCC)    s/p hand assisted laparoscopic bilateral nephrectomies 11/29/17, + RCC left   Secondary hyperparathyroidism (HCC)    Seizures (HCC)    one episode in past, due to" elevated Potassium" 08/02/20- "at least 4 years ago"   Sleep apnea    doesn't use CPAP anymore since weight loss   Stroke [facility])    no residual    Past Surgical History:  Procedure Laterality Date   ANTERIOR CERVICAL CORPECTOMY N/A 05/25/2023   Procedure: ANTERIOR CERVICAL CORPECTOMY CERVICAL FOUR, CERVICAL THREE - CERVICAL FIVE FUSION;  Surgeon: [name], MD;  Location: [facility] OR;  Service: Neurosurgery;  Laterality: N/A;   AV FISTULA PLACEMENT  Right    right arm   BIOPSY  08/01/2019   Procedure: BIOPSY;  Surgeon: Tami Falcon, MD;  Location: [facility] ENDOSCOPY;  Service: Endoscopy;;   BIOPSY  11/18/2020   Procedure: BIOPSY;  Surgeon: Elois Hair, MD;  Location: [facility] ENDOSCOPY;  Service: Gastroenterology;;   BIOPSY  09/13/2021   Procedure: BIOPSY;  Surgeon: Normie Becton., MD;  Location: [facility] ENDOSCOPY;  Service: Gastroenterology;;   CATARACT EXTRACTION W/ INTRAOCULAR LENS  IMPLANT, BILATERAL     COLON SURGERY     COLONOSCOPY N/A 08/04/2015   Procedure: COLONOSCOPY;  Surgeon: Nannette Babe, MD;  Location: [facility] ENDOSCOPY;  Service: Endoscopy;  Laterality: N/A;   COLONOSCOPY  2017   JMP@ [facility]-good prep-mass -recall 1 yr   COLONOSCOPY N/A 09/13/2021   Procedure: COLONOSCOPY;  Surgeon: Mansouraty, Albino Alu., MD;  Location: [facility] ENDOSCOPY;  Service: Gastroenterology;  Laterality: N/A;   COLONOSCOPY WITH PROPOFOL  N/A 11/25/2020   Procedure: COLONOSCOPY WITH PROPOFOL ;  Surgeon: Daina Drum, MD;  Location: [facility] ENDOSCOPY;  Service: Gastroenterology;  Laterality: N/A;   COLONOSCOPY WITH PROPOFOL  N/A 09/23/2021   Procedure: COLONOSCOPY WITH PROPOFOL ;  Surgeon: Annis Kinder, DO;  Location: [facility] ENDOSCOPY;  Service: Gastroenterology;  Laterality: N/A;   ENTEROSCOPY N/A 09/23/2021  Procedure: ENTEROSCOPY;  Surgeon: Annis Kinder, DO;  Location: [facility] ENDOSCOPY;  Service: Gastroenterology;  Laterality: N/A;  Will place order for video capsule study as we may opt to place it during procedure   ESOPHAGOGASTRODUODENOSCOPY N/A 08/01/2019   Procedure: ESOPHAGOGASTRODUODENOSCOPY (EGD);  Surgeon: Tami Falcon, MD;  Location: [facility] ENDOSCOPY;  Service: Endoscopy;  Laterality: N/A;   ESOPHAGOGASTRODUODENOSCOPY N/A 11/18/2020   Procedure: ESOPHAGOGASTRODUODENOSCOPY (EGD);  Surgeon: Elois Hair, MD;  Location: [facility] ENDOSCOPY;  Service: Gastroenterology;  Laterality: N/A;   ESOPHAGOGASTRODUODENOSCOPY (EGD) WITH PROPOFOL  N/A 08/04/2019   Procedure:  ESOPHAGOGASTRODUODENOSCOPY (EGD) WITH PROPOFOL ;  Surgeon: Ace Holder, MD;  Location: [facility] ENDOSCOPY;  Service: Gastroenterology;  Laterality: N/A;   ESOPHAGOGASTRODUODENOSCOPY (EGD) WITH PROPOFOL  N/A 09/13/2021   Procedure: ESOPHAGOGASTRODUODENOSCOPY (EGD) WITH PROPOFOL ;  Surgeon: Brice Campi Albino Alu., MD;  Location: [facility] ENDOSCOPY;  Service: Gastroenterology;  Laterality: N/A;   GIVENS CAPSULE STUDY N/A 09/23/2021   Procedure: GIVENS CAPSULE STUDY;  Surgeon: Annis Kinder, DO;  Location: [facility] ENDOSCOPY;  Service: Gastroenterology;  Laterality: N/A;   graft left arm Left    for dialysis x 2. Removed   HOT HEMOSTASIS  11/18/2020   Procedure: HOT HEMOSTASIS (ARGON PLASMA COAGULATION/BICAP);  Surgeon: Elois Hair, MD;  Location: [facility] ENDOSCOPY;  Service: Gastroenterology;;   HOT HEMOSTASIS N/A 09/23/2021   Procedure: HOT HEMOSTASIS (ARGON PLASMA COAGULATION/BICAP);  Surgeon: Annis Kinder, DO;  Location: [facility] ENDOSCOPY;  Service: Gastroenterology;  Laterality: N/A;   INSERTION OF DIALYSIS CATHETER     Rt chest   LAPAROSCOPIC RIGHT COLECTOMY N/A 08/05/2015   Procedure: LAPAROSCOPIC RIGHT COLECTOMY- ASCENDING;  Surgeon: Lockie Rima, MD;  Location: [facility] OR;  Service: General;  Laterality: N/A;   MASS EXCISION Left 05/28/2019   Procedure: EXCISION SOFT TISSUE MASS LEFT SHOULDER;  Surgeon: Oralee Billow, MD;  Location: [facility] ORS;  Service: General;  Laterality: Left;   NEPHRECTOMY Bilateral    PARATHYROIDECTOMY N/A 06/12/2016   Procedure: TOTAL PARATHYROIDECTOMY WITH AUTOTRANSPLANTATION TO LEFT FOREARM;  Surgeon: Oralee Billow, MD;  Location: [facility] OR;  Service: General;  Laterality: N/A;   PARATHYROIDECTOMY N/A 10/23/2016   Procedure: PARATHYROIDECTOMY;  Surgeon: Oralee Billow, MD;  Location: [facility] OR;  Service: General;  Laterality: N/A;   REVERSE SHOULDER ARTHROPLASTY Right 08/24/2020   Procedure: REVERSE SHOULDER ARTHROPLASTY;  Surgeon: Micheline Ahr, MD;  Location: [facility] OR;  Service: Orthopedics;   Laterality: Right;   REVISON OF ARTERIOVENOUS FISTULA Right 07/16/2017   Procedure: REVISION OF ARTERIOVENOUS FISTULA  Right ARM;  Surgeon: Adine Hoof, MD;  Location: [facility] OR;  Service: Vascular;  Laterality: Right;   TOTAL HIP ARTHROPLASTY Left 11/14/2020   Procedure: LEFT TOTAL HIP ARTHROPLASTY ANTERIOR APPROACH;  Surgeon: Wes Hamman, MD;  Location: [facility] OR;  Service: Orthopedics;  Laterality: Left;   UPPER GASTROINTESTINAL ENDOSCOPY  2021   @ [facility]    Family History  Problem Relation Age of Onset   Diabetes Father    Stroke Father    Hypertension Father    Uterine cancer Mother    Lupus Sister    Stroke Sister    Hypertension Sister    Anuerysm Brother        brain   Colon cancer Neg Hx    Esophageal cancer Neg Hx    Stomach cancer Neg Hx    Pancreatic cancer Neg Hx    Liver disease Neg Hx    Colon polyps Neg Hx    Rectal cancer Neg Hx     Social History:  reports that he quit smoking about 33 years ago. His smoking use included cigarettes. He has never used smokeless tobacco. He reports that he does not currently use alcohol after a past usage of about 1.0 standard drink of alcohol per week. He reports that he does not currently use drugs after having used the following drugs: Marijuana.  Allergies:  Allergies  Allergen Reactions   Infed [Iron Dextran] Other (See Comments)    Decreased BP    Vicodin [Hydrocodone -Acetaminophen ] Other (See Comments)    Decreased BP   Diclofenac Other (See Comments)    Caused hypotension    Medications: I have reviewed the patient's current medications.  Results for orders placed or performed during the hospital encounter of 05/30/23 (from the past 48 hours)  Renal function panel     Status: Abnormal   Collection Time: 06/17/23  2:00 PM  Result Value Ref Range   Sodium 133 (L) 135 - 145 mmol/L   Potassium 5.6 (H) 3.5 - 5.1 mmol/L   Chloride 94 (L) 98 - 111 mmol/L   CO2 21 (L) 22 - 32 mmol/L   Glucose, Bld 116 (H) 70 -  99 mg/dL    Comment: Glucose reference range applies only to samples taken after fasting for at least 8 hours.   BUN 98 (H) 8 - 23 mg/dL   Creatinine, Ser 0.45 (H) 0.61 - 1.24 mg/dL   Calcium  9.2 8.9 - 10.3 mg/dL   Phosphorus 4.3 2.5 - 4.6 mg/dL   Albumin  2.3 (L) 3.5 - 5.0 g/dL   GFR, Estimated 5 (L) >60 mL/min    Comment: (NOTE) Calculated using the CKD-EPI Creatinine Equation (2021)    Anion gap 18 (H) 5 - 15    Comment: Performed at [facility], [address] [place], [place] [address]  Renal function panel     Status: Abnormal   Collection Time: 06/18/23  5:16 AM  Result Value Ref Range   Sodium 133 (L) 135 - 145 mmol/L   Potassium 4.7 3.5 - 5.1 mmol/L   Chloride 94 (L) 98 - 111 mmol/L   CO2 26 22 - 32 mmol/L   Glucose, Bld 85 70 - 99 mg/dL    Comment: Glucose reference range applies only to samples taken after fasting for at least 8 hours.   BUN 56 (H) 8 - 23 mg/dL   Creatinine, Ser 1.91 (H) 0.61 - 1.24 mg/dL   Calcium  8.9 8.9 - 10.3 mg/dL   Phosphorus 3.9 2.5 - 4.6 mg/dL   Albumin  2.0 (L) 3.5 - 5.0 g/dL   GFR, Estimated 9 (L) >60 mL/min    Comment: (NOTE) Calculated using the CKD-EPI Creatinine Equation (2021)    Anion gap 13 5 - 15    Comment: Performed at [facility], [address], [place], [place] [address]  CBC with Differential/Platelet     Status: Abnormal   Collection Time: 06/18/23  5:16 AM  Result Value Ref Range   WBC 7.8 4.0 - 10.5 K/uL   RBC 2.54 (L) 4.22 - 5.81 MIL/uL   Hemoglobin 7.3 (L) 13.0 - 17.0 g/dL   HCT 56.2 (L) 13.0 - 86.5 %   MCV 89.4 80.0 - 100.0 fL   MCH 28.7 26.0 - 34.0 pg   MCHC 32.2 30.0 - 36.0 g/dL   RDW 78.4 (H) 69.6 - 29.5 %   Platelets 396 150 - 400 K/uL   nRBC 0.0 0.0 - 0.2 %   Neutrophils Relative % 63 %  Neutro Abs 5.0 1.7 - 7.7 K/uL   Lymphocytes Relative 9 %   Lymphs Abs 0.7 0.7 - 4.0 K/uL   Monocytes Relative 17 %   Monocytes Absolute 1.4 (H) 0.1 - 1.0 K/uL   Eosinophils Relative 8 %   Eosinophils  Absolute 0.6 (H) 0.0 - 0.5 K/uL   Basophils Relative 2 %   Basophils Absolute 0.1 0.0 - 0.1 K/uL   Immature Granulocytes 1 %   Abs Immature Granulocytes 0.05 0.00 - 0.07 K/uL    Comment: Performed at [facility], [address] [place], [place] [address]    No results found.  Review of Systems  Constitutional:  Negative for chills, diaphoresis and fever.  HENT:  Negative for ear discharge, ear pain, hearing loss and tinnitus.   Eyes:  Negative for photophobia and pain.  Respiratory:  Negative for cough and shortness of breath.   Cardiovascular:  Negative for chest pain.  Gastrointestinal:  Negative for abdominal pain, nausea and vomiting.  Genitourinary:  Negative for dysuria, flank pain, frequency and urgency.  Musculoskeletal:  Positive for arthralgias (Bilateral knees). Negative for back pain, myalgias and neck pain.  Neurological:  Negative for dizziness and headaches.  Hematological:  Does not bruise/bleed easily.  Psychiatric/Behavioral:  The patient is not nervous/anxious.    Blood pressure 132/76, pulse 92, temperature 98.5 F (36.9 C), temperature source Oral, resp. rate 17, height 6\' 1"  (1.854 m), weight 96 kg, SpO2 99%. Physical Exam Constitutional:      General: He is not in acute distress.    Appearance: He is well-developed. He is not diaphoretic.  HENT:     Head: Normocephalic and atraumatic.  Eyes:     General: No scleral icterus.       Right eye: No discharge.        Left eye: No discharge.     Conjunctiva/sclera: Conjunctivae normal.  Cardiovascular:     Rate and Rhythm: Normal rate and regular rhythm.  Pulmonary:     Effort: Pulmonary effort is normal. No respiratory distress.  Musculoskeletal:     Cervical back: Normal range of motion.     Comments: LLE No traumatic wounds, ecchymosis, or rash  Knee diffusely TTP  Mod knee effusion  Sens DPN, SPN, TN intact  Motor EHL, ext, flex, evers 5/5  DP 2+, PT 2+, No significant edema  Skin:     General: Skin is warm and dry.  Neurological:     Mental Status: He is alert.  Psychiatric:        Mood and Affect: Mood normal.        Behavior: Behavior normal.     Assessment/Plan: Right knee pain -- Have tapped and injected.    Georganna Kin, PA-C Orthopedic Surgery [phone] 06/18/2023, 1:56 PM

## 2023-06-18 NOTE — Progress Notes (Signed)
 Pt notified nurse at 0944pm that he did not get dinner try when he returned from dialysis prior to nurses shift. Nurse educated on what time the cafeteria closed. Pt stated he wasn't really hungry. Nurse provided snacks for patient.

## 2023-06-18 NOTE — Progress Notes (Signed)
 Physical Therapy Session Note  Patient Details  Name: [name]. MRN: [id number] Date of Birth: 01-02-1961  Today's Date: 06/18/2023 PT Missed Time: 60 Minutes Missed Time Reason: Patient unwilling to participate  Short Term Goals: Week 3:  PT Short Term Goal 1 (Week 3): Pt will perform bed mobility with min assist PT Short Term Goal 2 (Week 3): Pt will perform sit <> stands with mod A PT Short Term Goal 3 (Week 3): Pt will be able to gait x 150' with mod assist PT Short Term Goal 4 (Week 3): Pt will be able to independently perform and verbalize pressure relief techniques with PWC  Skilled Therapeutic Interventions/Progress Updates:     Pt reports he just got back in bed and is feeling very fatigued. PT encourages participation and pt politely declines. PT will follow up as able.   Therapy Documentation Precautions:  Precautions Precautions: Fall, Cervical, Other (comment) (HD MWF (RUE restricted).) Recall of Precautions/Restrictions: Impaired Precaution/Restrictions Comments: Verbally reviewed cervical precautions. Required Braces or Orthoses: Cervical Brace Cervical Brace: Hard collar Restrictions Weight Bearing Restrictions Per Provider Order: No General: PT Amount of Missed Time (min): 60 Minutes PT Missed Treatment Reason: Patient unwilling to participate   Therapy/Group: Individual Therapy  Neva Barban, PT DPT 06/18/2023, 5:04 PM

## 2023-06-18 NOTE — Progress Notes (Signed)
 Specimens ordered and collected by Steffanie Edouard, PA-C,  Sent specimens to lab  Randeen Busman, LPN

## 2023-06-18 NOTE — Patient Care Conference (Incomplete)
 Inpatient RehabilitationTeam Conference and Plan of Care Update Date: 06/18/2023   Time: 12:49 AM    Patient Name: Carl Twiddy Sr.      Medical Record Number: [id number]  Date of Birth: [birth year] Sex: Male         Room/Bed: 4W17C/4W17C-01 Payor Info: Payor: [employer]vertising copy[employer] MEDICARE / Plan: Sandford Croon DUAL COMPLETE / Product Type: *No Product type* /    Admit Date/Time:  05/30/2023  2:11 PM  Primary Diagnosis:  Spinal cord injury, cervical region, sequela [facility])  Hospital Problems: Principal Problem:   Spinal cord injury, cervical region, sequela (HCC) Active Problems:   Coping style affecting medical condition    Expected Discharge Date: Expected Discharge Date:  (pending)  Team Members Present:       Current Status/Progress Goal Weekly Team Focus  Bowel/Bladder      ***          Swallow/Nutrition/ Hydration      ***         ADL's      ***          Mobility   min A/CGA for slideboard transfers, mod+2 for sit <> stands and gait with Blanca Bunch, min to mod assist bed mobility, mod I power WC  *** S bed mobility, CGA transfers bed <> w/c, mod A sit < > stands, upgraded gait goal to min A, mod I PWC mobility  progressing transfers from slideboard, sit <> stands, NMR, progressing gait    Communication      ***          Safety/Cognition/ Behavioral Observations     ***          Pain      ***          Skin      ***           Discharge Planning:      Team Discussion: *** Patient on target to meet rehab goals: {IP REHAB YES/NO WITH [id number]}  *See Care Plan and progress notes for long and short-term goals.   Revisions to Treatment Plan:  ***  Teaching Needs: ***  Current Barriers to Discharge: {BARRIERS TO [id number]:84696}  Possible Resolutions to Barriers: ***     Medical Summary               I attest that I was present, lead the team conference, and concur with the assessment and plan of the  team.   [name] 06/18/2023, 12:49 AM

## 2023-06-18 NOTE — Progress Notes (Signed)
 Occupational Therapy Session Note  Patient Details  Name: Carl Clarkin Sr. MRN: [id number] Date of Birth: [birth year]  Today's Date: 06/18/2023 OT Individual Time: 4098-1191 OT Individual Time Calculation (min): 15 min  and Today's Date: 06/18/2023 OT Missed Time: 60 Minutes Missed Time Reason: Patient fatigue;Other (comment) (Pt wanted time to recover from knee aspiration and fatigued)   Short Term Goals: Week 3:  OT Short Term Goal 1 (Week 3): Pt will complete LB dressing with mod A STS from w/c or EOB OT Short Term Goal 2 (Week 3): Pt will complete toileting tasks with max A OT Short Term Goal 3 (Week 3): Pt will complete bathing tasks with mod A  Skilled Therapeutic Interventions/Progress Updates:      Therapy Documentation Precautions:  Precautions Precautions: Fall, Cervical, Other (comment) (HD MWF (RUE restricted).) Recall of Precautions/Restrictions: Impaired Precaution/Restrictions Comments: Verbally reviewed cervical precautions. Required Braces or Orthoses: Cervical Brace Cervical Brace: Hard collar Restrictions Weight Bearing Restrictions Per Provider Order: No General: "I just want to relax" Pt supine in bed upon OT arrival. OT wanting to complete ADL retraining for bathing with pt this session. Pt refusing bathing this date d/t knee aspiration. OT encouraging pt to participate in therapy, providing education and positioning for knee after small procedure. Pt wanting to relax after positioning until tomorrow.  Pain: unrated pain reported in knee, education, positioning provided, nsg aware   Pt supine in bed with bed alarm activated, 2 bed rails up, call light within reach and 4Ps assessed.   Therapy/Group: Individual Therapy  [name], OTD, OTR/L 06/18/2023, 3:45 PM

## 2023-06-18 NOTE — Progress Notes (Signed)
 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   R knee esp acting up again. And sacrum also bothering him some, but pain meds work for this. Mainly taking Percocet for knees, not neck.  Incision healed per pt.  Of note, robaxin  doesn't help knees at all.    Went over that Hb yesterday was 7.3- and has been hanging out around 7.3 to 7.7   ROS:    Pt denies SOB, abd pain, CP, N/V/C/D, and vision changes  B/L R>L knee pain (+)  Objective:   No results found.     Recent Labs    06/18/23 0516  WBC 7.8  HGB 7.3*  HCT 22.7*  PLT 396    Recent Labs    06/17/23 1400 06/18/23 0516  NA 133* 133*  K 5.6* 4.7  CL 94* 94*  CO2 21* 26  GLUCOSE 116* 85  BUN 98* 56*  CREATININE 9.85* 6.55*  CALCIUM  9.2 8.9     Intake/Output Summary (Last 24 hours) at 06/18/2023 0834 Last data filed at 06/17/2023 1941 Gross per 24 hour  Intake 360 ml  Output 3500 ml  Net -3140 ml        Physical Exam: Vital Signs Blood pressure 132/76, pulse 92, temperature 98.5 F (36.9 C), temperature source Oral, resp. rate 17, height 6\' 1"  (1.854 m), weight 96 kg, SpO2 99%.       General: awake, alert, appropriate, sitting EOB not dressed yet; in paper scrubs this AM; NAD HENT: conjugate gaze; oropharynx dry- wearing soft collar- incision healed CV: regular rate and rhythm- rate in 90's; no JVD Pulmonary: CTA B/L; no W/R/R- good air movement GI: soft, NT, ND, (+)BS Psychiatric: appropriate- joking some Neurological: Ox3 MSK B/L Knees R>>L still has effusions- TTP more on RLE Skin: no evidence of cellulitis to extremities.   PRIOR EXAMS: MsK: B/L knees with effusions- less TTP on R knee this AM , no erythema, not really focally warm to touch- Both have  effusions, - R hand swelling looks good/resolved  MSK- R hand swelling esp ulnar side of hand/dorsum and L wrist-stable this AM RUE- Biceps 4/5; WE 4-/5; Triceps 2/5; Grip 2-/5 and FA 1/5 LUE- 4/5 except triceps 3+ to 4-/5 RLE- HF 1 to 2-/5; KE 2-/5; DF/PF 2-/5 LLE- HF 3/5; KE 4-/5; DF/PF 4/5    Skin: C/D/I. Multiple lesions on BL legs with various stages of healing.  + RUE fistula - intact   MSK:      LLE knee with palpable effusion, no warmth, no ttp Mood: Pleasant affect, appropriate mood.  Neurologic exam: Alert and oriented x 4, fluent, follows commands, cranial nerves II through XII grossly intact, no hypertonia noted  Sensation: Reduced to light touch in distal right lower extremity to the mid-thigh       Strength:                RUE: 2/5 SA, 2/5 EF, 2/5 EE, 2/5 WE, 2/5 FF, 2/5 FA  LUE:  3/5 SA, 3/5 EF, 3/5 EE, 3/5 WE, 3/5 FF, 3/5 FA                RLE: 2/5 HF, 2/5 KE, 2/5  DF, 2/5  EHL, 2/5  PF                 LLE:  3/5 HF, 4/5 KE, 4/5  DF, 4/5  EHL, 4/5  PF  Neuroexam stable 4/21  Assessment/Plan: 1. Functional deficits which require 3+ hours per day of interdisciplinary therapy in a comprehensive inpatient rehab setting. Physiatrist is providing close team supervision and 24 hour management of active medical problems listed below. Physiatrist and rehab team continue to assess barriers to discharge/monitor patient progress toward functional and medical goals  Care Tool:  Bathing    Body parts bathed by patient: Right arm, Chest, Abdomen, Right upper leg, Left upper leg, Face   Body parts bathed by helper: Left arm, Front perineal area, Buttocks, Right lower leg, Left lower leg     Bathing assist Assist Level: Moderate Assistance - Patient 50 - 74%     Upper Body Dressing/Undressing Upper body dressing   What is the patient wearing?: Pull over shirt    Upper body assist Assist Level: Minimal Assistance - Patient > 75%    Lower Body  Dressing/Undressing Lower body dressing      What is the patient wearing?: Pants     Lower body assist Assist for lower body dressing: Moderate Assistance - Patient 50 - 74%     Toileting Toileting Toileting Activity did not occur Press photographer and hygiene only): N/A (no void or bm) (anuric, HD patient)  Toileting assist Assist for toileting: Maximal Assistance - Patient 25 - 49%     Transfers Chair/bed transfer  Transfers assist     Chair/bed transfer assist level: Minimal Assistance - Patient > 75%     Locomotion Ambulation   Ambulation assist   Ambulation activity did not occur: Safety/medical concerns  Assist level: 2 helpers Assistive device: Blanca Bunch Max distance: 15'   Walk 10 feet activity   Assist  Walk 10 feet activity did not occur: Safety/medical concerns  Assist level: 2 helpers Assistive device: Walker-Eva   Walk 50 feet activity   Assist Walk 50 feet with 2 turns activity did not occur: Safety/medical concerns  Assist level: 2 helpers Assistive device: Walker-Eva    Walk 150 feet activity   Assist Walk 150 feet activity did not occur: Safety/medical concerns  Assist level: 2 helpers      Walk 10 feet on uneven surface  activity   Assist Walk 10 feet on uneven surfaces activity did not occur: Safety/medical concerns         Wheelchair     Assist Is the patient using a wheelchair?: Yes Type of Wheelchair: Power    Wheelchair assist level: Independent Max wheelchair distance: 150    Wheelchair 50 feet with 2 turns activity    Assist        Assist Level: Independent   Wheelchair 150 feet activity     Assist      Assist Level: Independent   Blood pressure 132/76, pulse 92, temperature 98.5 F (36.9 C), temperature source Oral, resp. rate 17, height 6\' 1"  (1.854 m), weight 96 kg, SpO2 99%.  Medical Problem List and Plan: 1. 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  Antithrombotics: -DVT/anticoagulation:  Mechanical: Sequential compression devices, below knee Bilateral lower extremities             -antiplatelet therapy: N/A--no antiplatelet due to hx of recurrent GIB  4/25- d/w family- they agree that Lovenox  not appropriate at this time.   -06/09/23 ordered dopplers to r/o clots given low grade temps recently  4/28- LE Dopplers (-) for DVT 3. Pain Management:  oxycodone  prn. Gabapentin  300 mg/hs.              --has Flexeril  10 mg TID prn              - patient with intermittent, severe b/l headache - add topomax 25 mg BID    4/19- pt rating pain 07/19/08 which is "tolerable"  4/22- pain slightly better this AM  4/25- fewer complaints of pain -06/09/23 having more b/l knee pain and using percocets more-- could be causing confusion? Advised judicious use, might benefit from knee aspirations/injections? 4/28- less knee pain this AM 4/30- pain about the same- just started Prednisone  yesterday per Ortho Steffanie Edouard request 5/1- fewer complaints about knee pain 5/2- new pain is doing "well" on prednisone , hopefully does better once prednisone  is stopped after 3 days 5/6- Pt's knees- R knee especially started hurting again yesterday after Steroid dosing- can take Percocet as needed for knees 4. Mood/Behavior/Sleep: LCSW to follow for evaluation and support             -antipsychotic agents: N/A             --melatonin prn for  insomnia.  -06/09/23 poor sleep, d/t interruptions, asked nursing to minimize interruptions between 11p-5a (apparently they can't put a sign up, but will communicate in huddle) 5. Neuropsych/cognition: This patient is capable of making decisions on his own behalf. -06/09/23 son concerned with confusion, could be from poor sleep/pain meds vs cefepime  toxicity; getting head CT, dopplers, consulted ID Dr. Levern Reader as below 4/28- cognitively intact this AM 4/30- 5/3-doing well cognitively and brighter affect 6. Skin/Wound Care: Routine pressure relief measures.   4/19- due to ulcers on legs, on low air loss mattress 7. Fluids/Electrolytes/Nutrition: Strict I/O. Daily weights. Labs with HD. 8. ESRD- HD MWF: Schedule HD at the end of the day to help with tolerance of therapy             --Sensipar  and Renvela  for metabolic bone disease.   - Patient reports no longer makes urine  4/20- pt getting HD this AM- last HD was Wednesday.    9. Anemia of chronic disease: Monitor H/H, aranesp   -4/21 HGB lower 7.5, FOBT, recheck tomorrow  4/22- HB 8.7 on today's check-con't to monitor  -06/09/23 hgb 8.5 yesterday  4/28- Hb down to 7.7- 4/29- will try to recheck Thursday  4/30- if they don't check in HD, will check tomorrow 5/1- Hb down to 7.3- is Jehovah's Witness, so doesn't want transfusions?  5/6- Hb still 7.3- isn't dropping more- No blood or dark stools per pt- no signs of GI loss.  10. H/o recurrent GIB/Jehovah's Witness: Continue Protonix  BID 11. H/o Hypotension: Has had drop  in BP with HD. Now cervical SCI.  --Monitor for symptoms day after HD and with activity. -4/18 baseline blood pressure stable, monitor with activity 4/19- BP actually slightly elevated today- 140s-150's- con't regimen, since can drop with HD and therapy 4/20- BP actually doing better today- in 100s'110s- however HR elevated, likely to fluid overload 4/21 nephrology started midodrine  10mg  before dialysis, BP appears to be doing a  little better today 4/22- less hypotensive this AM laying- will monitor for [facility] 4/23- Tachycardia slightly better- in high 90's- having some intermittent elevated BP's- will monitor 4/24- BP running 140-s150s, but don't want to drop it since having HD 4/25- Having Hypotension with therapy, so don't want to reduce BP at all- doing TED's and ACE wraps- occ elevated, but with HD and therapy, not appropriate to reduce levels 4/29-5/4-  BP controlled- softer on dialysis days but stable; con't regimen 5/5- 5/6BP controlled- con't regimen Vitals:   06/17/23 1330 06/17/23 1400 06/17/23 1403 06/17/23 1430  BP: 110/76 121/79 110/76 126/80   06/17/23 1533 06/17/23 1600 06/17/23 1630 06/17/23 1704  BP: 124/80 128/79 135/88 (!) 158/93   06/17/23 1726 06/17/23 1730 06/17/23 2047 06/18/23 0545  BP: 131/79 135/84 (!) 146/82 132/76     12. Constipation: Small BM on 04/14 --had of nulytey yesterday and another today? -Xray 4/17 without excessive stool, patient reports stool still little hard yesterday.  Will increase MiraLAX  to 3 times a day, continue Senokot twice a day. Suppository PRN   06/09/23 LBM yesterday, cont regimen 4/28- LBM overnight- has been refusing miralax - made it prn- taking metamucil  4/30- asking for Sorbitol  today since needs ot have bigger BM  5/6- LBM yesterday 13. ?left knee effusion. Not currently bothersome. Consider imaging.  14.  SIRS- Fever/hypotension/tachycardia - 4/21 Currently afebrile, he got to 50 cc IVF yesterday, CXR negative, EKG sinus tachycardia. Patient was started on Maxipime  and vancomycin  yesterday.  Appears to be doing much better today.  WBC not elevated at 7.1.  Continue to follow blood cultures 4/22- Blood Cx's (-) so far- WBC 7.3- will con't IV ABX until know final results of blood Cx's- CXR (-)- but temp was 103.2 yesterday overnight.  4/23- Blood C'x (-) x2 days so far- T 99.9 this Am after a few sips of coffee- was 100.4 this AM- will con't IV ABX  since concerned about possible bacteremia- esp since having low grade temps still 4/24- No more fevers, even low grade- no growth on blood Cx' bu it says that didn't get enough blood, so much be falsely negative 4/25- No growth for 5 days, but it also said not enough blood to be sure was a true negative. Will change stop date til tomorrow -06/08/23 at 3am temp recorded as 100.3 but 5am was 98.3; will get CBC w/diff, monitor temps closely today, if continued low grade temps, will reach out to ID. BCx NGTD x5d. Didn't get vanc yesterday? Or cefepime ? Unclear why. Cefepime  started this morning at 0011. -06/09/23 temp 100.3 at 3am yesterday and then 100 later in the day, but no fevers since, no tachycardia.  -WBC yesterday 8.9.  -Now with some confusion; spoke with Dr. Levern Reader of ID, stated cefepime  could be the cause of the confusion -recommended we stop all abx (which ended yesterday anyway) -watch the  patient OFF abx -if fevers >100.3 occur then she recommended repeat BCx and CXR and could call her back to see if any abx would be advised at that time.  -Head CT and dopplers ordered as well, as above 4/28- CT looks good- no fever, WBC is OK- off ABX 4/29- Also Dopplers (-) feeling good this AM 06/16/23 seems to have resolved. 15. Tachycardia 4/22- doing better- right at 100 this AM- will monitor- likely can be from dregs of infection vs OH. -will monitor  4/23- Tachycardia improving- 4/24-27 HR in 90's right now- doing better  4/29- 4/30- HR running in 90s-low 100's today 16. R hand swelling  4/23- doing ice- will do xrays 4/24- Xray doesn't look like cause of swelling, however could be due to new shunt? 4/30- resolved 17. Hypocalcemia 4/24- his Albumin  is 2.1, so probably ok, and phos coming down- will leave to renal- thank you for their help  5/1- Ca 9.3 18. Hyperkalemia 4/28- K+ 6.3 today- got hypoosmotic bath to reduce K+ per note from RN/HD nurse? Also has Na of 127- being addressed by  HD/renal- Phos also 5.8- but again, getting HD today 5/1- K+ down to 5.6 and Na 129 5/6- Na 133 and K+ down to 4.7 from 5.6 19. R>L knee pain  4/29- will ask Ortho if they will tap his R knee- I have no problem doing injection of knee, however with so much fluid, I would rather Ortho tap him and then do steroid injection.   4/30- we added Prednisone  40 mg daily x 3 days- to see if helps knee pain  5/1- mild reduction in pain so far  5/6- Pt has short term improvement from Prednisone  dosing for knees- con't percocet prn   I spent a total of 38   minutes on total care today- >50% coordination of care- due to  D/w pt about knee pain and how pain getting more controlled with pain meds, not robaxin - team conference today to determine LOS    LOS: 19 days A FACE TO FACE EVALUATION WAS PERFORMED  Luna Audia 06/18/2023, 8:34 AM

## 2023-06-18 NOTE — Procedures (Signed)
 Procedure: Right knee aspiration and injection   Indication: Right knee effusion(s)   Surgeon: Starr Eddy, PA-C   Assist: None   Anesthesia: Topical refrigerant   EBL: None   Complications: None   Findings: After risks/benefits explained patient desires to undergo procedure. Consent obtained and time out performed. The right knee was sterilely prepped and aspirated. 77ml clear yellow fluid obtained. 6ml 0.5% Marcaine  and 40mg  depo-medrol  instilled. Pt tolerated the procedure well.       Georganna Kin, PA-C Orthopedic Surgery [phone]

## 2023-06-19 LAB — RENAL FUNCTION PANEL
Albumin: 2.2 g/dL — ABNORMAL LOW (ref 3.5–5.0)
Anion gap: 15 (ref 5–15)
BUN: 98 mg/dL — ABNORMAL HIGH (ref 8–23)
CO2: 22 mmol/L (ref 22–32)
Calcium: 8.8 mg/dL — ABNORMAL LOW (ref 8.9–10.3)
Chloride: 95 mmol/L — ABNORMAL LOW (ref 98–111)
Creatinine, Ser: 9.2 mg/dL — ABNORMAL HIGH (ref 0.61–1.24)
GFR, Estimated: 6 mL/min — ABNORMAL LOW (ref >60)
Glucose, Bld: 135 mg/dL — ABNORMAL HIGH (ref 70–99)
Phosphorus: 5.5 mg/dL — ABNORMAL HIGH (ref 2.5–4.6)
Potassium: 5.5 mmol/L — ABNORMAL HIGH (ref 3.5–5.1)
Sodium: 132 mmol/L — ABNORMAL LOW (ref 135–145)

## 2023-06-19 LAB — CBC WITH DIFFERENTIAL/PLATELET
Abs Immature Granulocytes: 0.03 10*3/uL (ref 0.00–0.07)
Basophils Absolute: 0.1 10*3/uL (ref 0.0–0.1)
Basophils Relative: 1 %
Eosinophils Absolute: 0.1 10*3/uL (ref 0.0–0.5)
Eosinophils Relative: 2 %
HCT: 22.2 % — ABNORMAL LOW (ref 39.0–52.0)
Hemoglobin: 7.1 g/dL — ABNORMAL LOW (ref 13.0–17.0)
Immature Granulocytes: 0 %
Lymphocytes Relative: 9 %
Lymphs Abs: 0.6 10*3/uL — ABNORMAL LOW (ref 0.7–4.0)
MCH: 29.2 pg (ref 26.0–34.0)
MCHC: 32 g/dL (ref 30.0–36.0)
MCV: 91.4 fL (ref 80.0–100.0)
Monocytes Absolute: 0.8 10*3/uL (ref 0.1–1.0)
Monocytes Relative: 11 %
Neutro Abs: 5.5 10*3/uL (ref 1.7–7.7)
Neutrophils Relative %: 77 %
Platelets: 371 10*3/uL (ref 150–400)
RBC: 2.43 MIL/uL — ABNORMAL LOW (ref 4.22–5.81)
RDW: 15.7 % — ABNORMAL HIGH (ref 11.5–15.5)
WBC: 7.1 10*3/uL (ref 4.0–10.5)
nRBC: 0 % (ref 0.0–0.2)

## 2023-06-19 MED ORDER — HEPARIN SODIUM (PORCINE) 1000 UNIT/ML IJ SOLN
INTRAMUSCULAR | Status: AC
  Filled 2023-06-19: qty 3

## 2023-06-19 MED ORDER — HEPARIN SODIUM (PORCINE) 1000 UNIT/ML DIALYSIS: 3000.0000 [IU] | Freq: Once | INTRAMUSCULAR | Status: DC

## 2023-06-19 MED ORDER — HEPARIN SODIUM (PORCINE) 1000 UNIT/ML DIALYSIS: 1000.0000 [IU] | INTRAMUSCULAR | Status: DC | PRN

## 2023-06-19 NOTE — Progress Notes (Signed)
 Patient off the unit for dialysis.   Carl Busman, LPN

## 2023-06-19 NOTE — Patient Care Conference (Signed)
 Inpatient RehabilitationTeam Conference and Plan of Care Update Date: 06/18/2023   Time: 1150 am    Patient Name: Carl Willems Sr.      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 Celia Coles Social Worker Present: Adrianna Albee, LCSW Nurse Present: Jerene Monks, RN PT Present: Gita Lamb, PT OT Present: [name], OT     Current Status/Progress Goal Weekly Team Focus  Bowel/Bladder   Continent of bowel and bladder. LBM 5/5   Pt remain free from S/S of constipation   Offer toileting q2 hours during dya and q4 at hs. Given bowel meds as ordered.    Swallow/Nutrition/ Hydration               ADL's   Min-CGA lateral transfers, Max A LB dressing, Min A UB dressing, Mod A bathing   Mod A, downgraded 4/28   ADL retraining, transfer training    Mobility   min A/CGA for slideboard transfers, mod+2 for sit <> stands and gait with Blanca Bunch, min to mod assist bed mobility, mod I power WC   S bed mobility, CGA transfers bed <> w/c, mod A sit < > stands, upgraded gait goal to min A, mod I PWC mobility  progressing transfers from slideboard, sit <> stands, NMR, progressing gait    Communication                Safety/Cognition/ Behavioral Observations               Pain   C/O of pain to bilateral knees   Pt states pain level <3 out of 10.   Assess pain q shift and prn. Give medication as ordered.     Skin   Scabbed area to posterior head OTA, fistula RUE   Pt remain free from s/s of infection.  Assess skin q shift and PRN.      Discharge Planning:  making progress in therapies family considering their options and are aware he will require 24/7 care. Touring area SNF',    Team Discussion: Patient was admitted post Corpectomy with traumatic C4 SCI and on Hemodialysis.Patient limited by left knee pain and  swelling.   Patient on target to meet rehab goals: no, Patient requires max assist with lower body dressing and minimal assist with upper body care. Patient requires minimal assistance with lateral transfers. Patient requires mod- max assist +2 with gait using Eva walker  and  mod assist using a power WC. Overall goals at discharge are set for mod assist.  *See Care Plan and progress notes for long and short-term goals.   Revisions to Treatment Plan:  Downgraded goals  Power wheelchair Slide board Family Conference Resting hand splint Built up handles for feeding and grooming C-Collar Head CT Dopplers W/C evaluation Ortho consult  Teaching Needs: Safety, medications, dietary modifications, safety, toileting and transfers, etc    Current Barriers to Discharge: Decreased caregiver support, Home enviroment access/layout, and Hemodialysis  Possible Resolutions to Barriers: Family Education SNF     Medical Summary Current Status: skin ok- incision healed- pain in R>L knees-  Barriers to Discharge: Self-care education;Spasticity;Behavior/Mood;Hypotension;Renal Insufficiency/Failure;Uncontrolled Pain;Weight bearing restrictions;Medical stability;Neurogenic Bowel & Bladder  Barriers to Discharge Comments: turned corner Friday- but back wqorse again due to knee pain- very limiting to therapy- Possible Resolutions to [employer] Focus: will try to get pt R knee steroid injection/tap-  and family starting to look for SNF-   Continued Need for Acute Rehabilitation Level  of Care: The patient requires daily medical management by a physician with specialized training in physical medicine and rehabilitation for the following reasons: Direction of a multidisciplinary physical rehabilitation program to maximize functional independence : Yes Medical management of patient stability for increased activity during participation in an intensive rehabilitation regime.: Yes Analysis of laboratory values and/or radiology reports with any subsequent need for medication adjustment and/or medical intervention. : Yes   I attest that I was present, lead the team conference, and concur with the assessment and plan of the team.   [name] 06/18/2022, 1150 am

## 2023-06-19 NOTE — Progress Notes (Signed)
 [facility] Progress Note   Subjective:    Seen and examined patient at bedside. No issues. Plan for HD this afternoon.  Objective Vitals:   06/18/23 2020 06/19/23 0421 06/19/23 0451 06/19/23 1252  BP: (!) 154/90 94/69 100/67   Pulse: 84 92 83   Resp: 16 16 16    Temp: 98 F (36.7 C) 98.9 F (37.2 C) 98 F (36.7 C)   TempSrc: Oral Oral Oral   SpO2: 100%     Weight:   93 kg 95.4 kg  Height:       Physical Exam General: Alert male in NAD; on RA Heart: RRR, no murmurs, rubs or gallops Lungs: CTA bilaterally, respirations unlabored Abdomen: Soft, non-distended, +BS Extremities: trace edema, wearing compression stockings Dialysis Access:  AVF + t/b  Filed Weights   06/18/23 0539 06/19/23 0451 06/19/23 1252  Weight: 96 kg 93 kg 95.4 kg    Intake/Output Summary (Last 24 hours) at 06/19/2023 1304 Last data filed at 06/19/2023 0734 Gross per 24 hour  Intake 360 ml  Output --  Net 360 ml    Additional Objective Labs: Basic Metabolic Panel: Recent Labs  Lab 06/14/23 1326 06/17/23 1400 06/18/23 0516  NA 132* 133* 133*  K 5.2* 5.6* 4.7  CL 92* 94* 94*  CO2 22 21* 26  GLUCOSE 105* 116* 85  BUN 88* 98* 56*  CREATININE 8.60* 9.85* 6.55*  CALCIUM  9.7 9.2 8.9  PHOS 4.4 4.3 3.9   Liver Function Tests: Recent Labs  Lab 06/14/23 1326 06/17/23 1400 06/18/23 0516  ALBUMIN  2.5* 2.3* 2.0*   No results for input(s): "LIPASE", "AMYLASE" in the last 168 hours. CBC: Recent Labs  Lab 06/12/23 1516 06/14/23 1450 06/18/23 0516  WBC 6.3 8.0 7.8  NEUTROABS 4.4  --  5.0  HGB 7.3* 7.5* 7.3*  HCT 22.6* 23.6* 22.7*  MCV 90.0 90.1 89.4  PLT 370 436* 396   Blood Culture    Component Value Date/Time   SDES FLUID 06/18/2023 1357   SPECREQUEST NONE 06/18/2023 1357   CULT  06/18/2023 1357    NO GROWTH < 24 HOURS Performed at [facility], [address] [place], [place] [address]    REPTSTATUS PENDING 06/18/2023 1357    Cardiac Enzymes: No results  for input(s): "CKTOTAL", "CKMB", "CKMBINDEX", "TROPONINI" in the last 168 hours. CBG: No results for input(s): "GLUCAP" in the last 168 hours. Iron Studies: No results for input(s): "IRON", "TIBC", "TRANSFERRIN", "FERRITIN" in the last 72 hours. Lab Results  Component Value Date   INR 1.1 05/27/2023   INR 1.1 05/25/2023   INR 1.1 09/17/2021   Studies/Results: No results found.  Medications:   (feeding supplement) PROSource Plus  30 mL Oral BID BM   atorvastatin   10 mg Oral Q Thu   carbamide peroxide  5 drop Right EAR BID   Chlorhexidine  Gluconate Cloth  6 each Topical Q0600   Chlorhexidine  Gluconate Cloth  6 each Topical Q0600   Chlorhexidine  Gluconate Cloth  6 each Topical Q0600   Chlorhexidine  Gluconate Cloth  6 each Topical Q0600   Chlorhexidine  Gluconate Cloth  6 each Topical Q0600   cinacalcet   120 mg Oral Q supper   darbepoetin (ARANESP ) injection - DIALYSIS  150 mcg Subcutaneous Q Tue-1800   gabapentin   300 mg Oral QHS   Gerhardt's butt cream   Topical BID   heparin   3,000 Units Dialysis Once in dialysis   pantoprazole   40 mg Oral BID AC   psyllium  1 packet Oral TID   senna-docusate  2 tablet Oral BID   sevelamer  carbonate  3,200 mg Oral TID WC   topiramate   25 mg Oral BID    Dialysis Orders: OP Dialysis Orders [facility] - MWF 4h  B400   85.5kg   2K bath  AVF   - Heparin  3000 units IV three times per week  - Mircera 60mcg IV q 2 weeks- last dose 05/24/23  - Calcitriol  1.5mcg PO q HD  Assessment/Plan: 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 Now in CIR ESRD: Now back on MWF schedule. K+ now at goal after HD. Next HD this afternoon. 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 It was noted in our outpatient records of no blood products except in  emergent situations. Secondary HPTH: CorrCa >10, calcitriol  on hold for now/Phos now at goal, continue sevelamer  and sensipar  Nutrition: Alb 2.0, continue protein supplements  [name], NP [facility] 06/19/2023,1:04 PM  LOS: 20 days

## 2023-06-19 NOTE — Plan of Care (Signed)
  Problem: Consults Goal: RH SPINAL CORD INJURY PATIENT EDUCATION Description:  See Patient Education module for education specifics.  Outcome: Progressing   Problem: SCI BOWEL ELIMINATION Goal: RH STG MANAGE BOWEL WITH ASSISTANCE Description: STG Manage Bowel with minimal Assistance. Outcome: Progressing Goal: RH STG SCI MANAGE BOWEL WITH MEDICATION WITH ASSISTANCE Description: STG SCI Manage bowel with medication with assistance. Outcome: Progressing   Problem: RH SKIN INTEGRITY Goal: RH STG SKIN FREE OF INFECTION/BREAKDOWN Description: Manage skin free of infection/breakdown with minimal assistance Outcome: Progressing   Problem: RH SAFETY Goal: RH STG ADHERE TO SAFETY PRECAUTIONS W/ASSISTANCE/DEVICE Description: STG Adhere to Safety Precautions With minimal Assistance/Device. Outcome: Progressing

## 2023-06-19 NOTE — Progress Notes (Signed)
 Occupational Therapy Session Note  Patient Details  Name: Carl Monley Sr. MRN: [id number] Date of Birth: May 12, 1960  Today's Date: 06/19/2023 OT Individual Time: 4098-1191 OT Individual Time Calculation (min): 64 min    Short Term Goals: Week 3:  OT Short Term Goal 1 (Week 3): Pt will complete LB dressing with mod A STS from w/c or EOB OT Short Term Goal 2 (Week 3): Pt will complete toileting tasks with max A OT Short Term Goal 3 (Week 3): Pt will complete bathing tasks with mod A  Skilled Therapeutic Interventions/Progress Updates:  Pt greeted seated in PWC with OT present, pt agreeable to OT intervention.      Transfers/bed mobility/functional mobility:  Pt completed w/c mobility MODI.   Therapeutic activity:  Pt completed functional reaching task with RUE to retrieve horseshoes from horizontal board and then toss to target. Cues needed to attempt to use as many digits as possible to facilitate composite digit flexion as pt wanting to only oppose 2 digits at a time.  Pt completed same task but with pt reaching against level 2 theraband to provide increased resistance for + strength training.   Pt then completed functional reaching task with pt using RUE to remove resisted squigz off mirror from seated in PWC with a focus on improving functional grip strength in RUE. Graded task up and had pt use LUE to place squigz on target and then use RUE to remove. Pt compensating with use of R shoulder by hiking it into scapular elevation vs extending elbow. Supported R elbow and assisted with active elbow extension.      Exercises:  Pt completed 10 mins on Nustep ergometer at below parameters to facilitate improved global strength and endurance. RUE secured to handle with ace wrap.   Forward rotation: 10 mins level 2  Backwards rotation: 10 mins level 1     Ended session with pt seated in w/c all needs within reach.               Therapy Documentation Precautions:   Precautions Precautions: Fall, Cervical, Other (comment) (HD MWF (RUE restricted).) Recall of Precautions/Restrictions: Impaired Precaution/Restrictions Comments: Verbally reviewed cervical precautions. Required Braces or Orthoses: Cervical Brace Cervical Brace: Hard collar Restrictions Weight Bearing Restrictions Per Provider Order: No  Pain: no pain     Therapy/Group: Individual Therapy  Mollie Anger [facility] 06/19/2023, 12:09 PM

## 2023-06-19 NOTE — Progress Notes (Addendum)
   06/19/23 1852  Vitals  Temp 98.2 F (36.8 C)  Pulse Rate 99  Resp 20  BP 99/62  SpO2 100 %  O2 Device Room Air  Weight 94.9 kg  Type of Weight Post-Dialysis  Post Treatment  Dialyzer Clearance Lightly streaked  Hemodialysis Intake (mL) 0 mL  Liters Processed 84  Fluid Removed (mL) 2.5 mL  Tolerated HD Treatment Yes  AVG/AVF Arterial Site Held (minutes) 5 minutes  AVG/AVF Venous Site Held (minutes) 5 minutes    Received patient in bed to unit.  Alert and oriented.  Informed consent signed and in chart.   TX duration: Three hours and thirty minutes  Patient tolerated well.  Transported back to the room  Alert, without acute distress.  Hand-off given to patient's nurse.   Access used: Right lower arm fistula Access issues: None

## 2023-06-19 NOTE — Progress Notes (Signed)
 Physical Therapy Session Note  Patient Details  Name: Carl Caban Sr. MRN: [id number] Date of Birth: 1960-03-26  Today's Date: 06/19/2023 PT Individual Time: 1035-1130 PT Individual Time Calculation (min): 55 min   Short Term Goals: Week 3:  PT Short Term Goal 1 (Week 3): Pt will perform bed mobility with min assist PT Short Term Goal 2 (Week 3): Pt will perform sit <> stands with mod A PT Short Term Goal 3 (Week 3): Pt will be able to gait x 150' with mod assist PT Short Term Goal 4 (Week 3): Pt will be able to independently perform and verbalize pressure relief techniques with PWC  Skilled Therapeutic Interventions/Progress Updates: Pt presented in PWC agreeable to therapy. Pt states R knee pain well controlled after aspiration. Pt discussed that trailed B platform  RW however felt too "wobbly" therefore OT setup RW with R hand splint, PTA exchanged to [facility] for increased support. Pt navigated PWC with supervision to day room. Pt set up PWC to elevated height. Pt performed Sit to stand from [facility] with maxA. Completed stand step transfer with RW and maxA x 2 as pt required maxA for RW management. Pt with poor to near absent LLE foot clearance. Once completed mat raised to 23in and pt worked on Sit to stand with RW x 5 with pt consistently requiring maxA. After x 2 stands PTA placed yoga block on L side and instructed pt in using RUE to push up. Pt continues to require maxA from 23in mat due to decreased quad and hip ms recruitment and decreased anterior translation of hips turning transition. However in standing pt required as little as CGA for static stand with no significant increase in R knee pain.During last x 2 standing bouts pt worked on weight shifting to allow for increased wt bearing tolerance on RLE as noted that pt maintained significantly more weight on LLE. On last bouts pt performed toe taps to target with LLE with PTA blocking RLE. Pt with mild knee buckling noted. Pt then completed  stand step transfer requiring maxA to stand but closer to modA to complete transfer with pt noted to manage RW significantly better than at start of session. Pt set up in PWC and navigated back to room in same manner as prior. Pt left in PWC at end of session with call bell within reach and current needs met.      Therapy Documentation Precautions:  Precautions Precautions: Fall, Cervical, Other (comment) (HD MWF (RUE restricted).) Recall of Precautions/Restrictions: Impaired Precaution/Restrictions Comments: Verbally reviewed cervical precautions. Required Braces or Orthoses: Cervical Brace Cervical Brace: Hard collar Restrictions Weight Bearing Restrictions Per Provider Order: No     Therapy/Group: Individual Therapy  Nikkol Pai 06/19/2023, 12:36 PM

## 2023-06-19 NOTE — Progress Notes (Signed)
 Occupational Therapy Session Note  Patient Details  Name: Carl Lydecker Sr. MRN: [id number] Date of Birth: 10/26/1960  Today's Date: 06/19/2023 OT Individual Time: 7829-5621 OT Individual Time Calculation (min): 75 min    Short Term Goals: Week 3:  OT Short Term Goal 1 (Week 3): Pt will complete LB dressing with mod A STS from w/c or EOB OT Short Term Goal 2 (Week 3): Pt will complete toileting tasks with max A OT Short Term Goal 3 (Week 3): Pt will complete bathing tasks with mod A  Skilled Therapeutic Interventions/Progress Updates:      Therapy Documentation Precautions:  Precautions Precautions: Fall, Cervical, Other (comment) (HD MWF (RUE restricted).) Recall of Precautions/Restrictions: Impaired Precaution/Restrictions Comments: Verbally reviewed cervical precautions. Required Braces or Orthoses: Cervical Brace Cervical Brace: Hard collar Restrictions Weight Bearing Restrictions Per Provider Order: No General: "Hey Kahliyah Dick!" Pt seated EOB upon OT arrival, agreeable to OT session. OT offered bathing tasks, pt declined.   Pain: no pain reported, mentioned knee feels better since aspiration completed yesterday on 5/6  ADL: OT providing skilled intervention on ADL retraining in order to increase independence with tasks and increase activity tolerance. Pt completed the following tasks at the current level of assist: UB dressing: SBA seated on bed doffing scrub shirt and donning clean overhead shirt, able to maintain trunk support on EOB  LB dressing: Min A overall seated EOB, intermittent use of reacher to thread feet into pants which required Min A, pt able to pull pants up standing in stedy with no UE support in stedy Footwear: total A to don TEDs and shoes, OT educated pt on elastic shoe laces for increased independence  Transfers: sit to stand SBA +1 into stedy from raised bed, also completed sit to stand from EOB><RW with Mod A +1 from raised bed, OT disassembled PFRW and  issued standard RW with hand splint to compensate for decreased grip on Rt side, pt with noted decreased difficulty completing sit to stand with standard set up. Pt complete stand step transfer with RW from EOB>W/C with Min A and increased time, VC for increased step clearance with RLE and RW management   Pt seated in W/C at end of session with W/C alarm donned, call light within reach and 4Ps assessed.    Therapy/Group: Individual Therapy  [name], OTD, OTR/L 06/19/2023, 12:51 PM

## 2023-06-19 NOTE — Plan of Care (Signed)
  Problem: Consults Goal: RH SPINAL CORD INJURY PATIENT EDUCATION Description:  See Patient Education module for education specifics.  Outcome: Progressing   Problem: SCI BOWEL ELIMINATION Goal: RH STG MANAGE BOWEL WITH ASSISTANCE Description: STG Manage Bowel with minimal Assistance. Outcome: Progressing   Problem: SCI BOWEL ELIMINATION Goal: RH STG SCI MANAGE BOWEL WITH MEDICATION WITH ASSISTANCE Description: STG SCI Manage bowel with medication with assistance. Outcome: Progressing   Problem: RH SKIN INTEGRITY Goal: RH STG SKIN FREE OF INFECTION/BREAKDOWN Description: Manage skin free of infection/breakdown with minimal assistance Outcome: Progressing   Problem: RH SAFETY Goal: RH STG ADHERE TO SAFETY PRECAUTIONS W/ASSISTANCE/DEVICE Description: STG Adhere to Safety Precautions With minimal Assistance/Device. Outcome: Progressing   Problem: RH PAIN MANAGEMENT Goal: RH STG PAIN MANAGED AT OR BELOW PT'S PAIN GOAL Description: Pain<4 w/ prns Outcome: Progressing   Problem: RH KNOWLEDGE DEFICIT SCI Goal: RH STG INCREASE KNOWLEDGE OF SELF CARE AFTER SCI Description: Manage increase knowledge of self care after SCI with minimal assistance using educational materials provided to son Outcome: Progressing

## 2023-06-19 NOTE — Progress Notes (Signed)
 PROGRESS NOTE   Subjective/Complaints:   Pt worried about Hb- however notes no dark stools- and refused AM labs- wants ot get in HD Also notes that took 100cc off R knee yesterday- wasn't in note how much was removed.  But did note was tapped and injected.   R knee not hurting this Am- maybe 1/10! Said they sent fluid for analysis.  Takes metamucil for stools- doesn't want accident, so doesn't want miralax /Sorbitol .   ROS:    Pt denies SOB, abd pain, CP, N/V/C/D, and vision changes   B/L R>L knee pain (+)- R knee very improved  Objective:   No results found.     Recent Labs    06/18/23 0516  WBC 7.8  HGB 7.3*  HCT 22.7*  PLT 396    Recent Labs    06/17/23 1400 06/18/23 0516  NA 133* 133*  K 5.6* 4.7  CL 94* 94*  CO2 21* 26  GLUCOSE 116* 85  BUN 98* 56*  CREATININE 9.85* 6.55*  CALCIUM  9.2 8.9     Intake/Output Summary (Last 24 hours) at 06/19/2023 0820 Last data filed at 06/19/2023 0734 Gross per 24 hour  Intake 360 ml  Output --  Net 360 ml        Physical Exam: Vital Signs Blood pressure 100/67, pulse 83, temperature 98 F (36.7 C), temperature source Oral, resp. rate 16, height 6\' 1"  (1.854 m), weight 93 kg, SpO2 100%.       General: awake, alert, appropriate, sitting EOB; not dressed yet- has OT any minute; NAD HENT: conjugate gaze; oropharynx moist CV: regular rate and rhythm; no JVD Pulmonary: CTA B/L; no W/R/R- good air movement GI: soft, NT, ND, (+)BS- slightly protuberant vs distension Psychiatric: appropriate- bright affect Neurological: Ox3 MSK: R knee appears less swollen PRIOR EXAMS: MsK: B/L knees with effusions- less TTP on R knee this AM , no erythema, not really focally warm to touch- Both have effusions, - R hand swelling looks good/resolved  MSK- R hand swelling esp ulnar side of hand/dorsum and L wrist-stable this AM RUE- Biceps 4/5; WE 4-/5; Triceps 2/5;  Grip 2-/5 and FA 1/5 LUE- 4/5 except triceps 3+ to 4-/5 RLE- HF 1 to 2-/5; KE 2-/5; DF/PF 2-/5 LLE- HF 3/5; KE 4-/5; DF/PF 4/5    Skin: C/D/I. Multiple lesions on BL legs with various stages of healing.  + RUE fistula - intact   MSK:      LLE knee with palpable effusion, no warmth, no ttp Mood: Pleasant affect, appropriate mood.  Neurologic exam: Alert and oriented x 4, fluent, follows commands, cranial nerves II through XII grossly intact, no hypertonia noted  Sensation: Reduced to light touch in distal right lower extremity to the mid-thigh       Strength:                RUE: 2/5 SA, 2/5 EF, 2/5 EE, 2/5 WE, 2/5 FF, 2/5 FA                LUE:  3/5 SA, 3/5 EF, 3/5 EE, 3/5 WE, 3/5 FF, 3/5 FA  RLE: 2/5 HF, 2/5 KE, 2/5  DF, 2/5  EHL, 2/5  PF                 LLE:  3/5 HF, 4/5 KE, 4/5  DF, 4/5  EHL, 4/5  PF  Neuroexam stable 4/21  Assessment/Plan: 1. Functional deficits which require 3+ hours per day of interdisciplinary therapy in a comprehensive inpatient rehab setting. Physiatrist is providing close team supervision and 24 hour management of active medical problems listed below. Physiatrist and rehab team continue to assess barriers to discharge/monitor patient progress toward functional and medical goals  Care Tool:  Bathing    Body parts bathed by patient: Right arm, Chest, Abdomen, Right upper leg, Left upper leg, Face   Body parts bathed by helper: Left arm, Front perineal area, Buttocks, Right lower leg, Left lower leg     Bathing assist Assist Level: Moderate Assistance - Patient 50 - 74%     Upper Body Dressing/Undressing Upper body dressing   What is the patient wearing?: Pull over shirt    Upper body assist Assist Level: Minimal Assistance - Patient > 75%    Lower Body Dressing/Undressing Lower body dressing      What is the patient wearing?: Pants     Lower body assist Assist for lower body dressing: Moderate Assistance - Patient 50 - 74%      Toileting Toileting Toileting Activity did not occur (Clothing management and hygiene only): N/A (no void or bm) (anuric, HD patient)  Toileting assist Assist for toileting: Maximal Assistance - Patient 25 - 49%     Transfers Chair/bed transfer  Transfers assist     Chair/bed transfer assist level: 2 Helpers (CGA with slideboard; +2 for stand step with Blanca Bunch)     Locomotion Ambulation   Ambulation assist   Ambulation activity did not occur: Safety/medical concerns  Assist level: 2 helpers Assistive device: Blanca Bunch Max distance: 15'   Walk 10 feet activity   Assist  Walk 10 feet activity did not occur: Safety/medical concerns  Assist level: 2 helpers Assistive device: Walker-Eva   Walk 50 feet activity   Assist Walk 50 feet with 2 turns activity did not occur: Safety/medical concerns  Assist level: 2 helpers Assistive device: [employer] 150 feet activity   Assist Walk 150 feet activity did not occur: Safety/medical concerns  Assist level: 2 helpers      Walk 10 feet on uneven surface  activity   Assist Walk 10 feet on uneven surfaces activity did not occur: Safety/medical concerns         Wheelchair     Assist Is the patient using a wheelchair?: Yes Type of Wheelchair: Power    Wheelchair assist level: Independent Max wheelchair distance: 150    Wheelchair 50 feet with 2 turns activity    Assist        Assist Level: Independent   Wheelchair 150 feet activity     Assist      Assist Level: Independent   Blood pressure 100/67, pulse 83, temperature 98 F (36.7 C), temperature source Oral, resp. rate 16, height 6\' 1"  (1.854 m), weight 93 kg, SpO2 100%.  Medical Problem List and Plan: 1. 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  Antithrombotics: -DVT/anticoagulation:  Mechanical: Sequential compression devices, below knee Bilateral lower extremities             -antiplatelet therapy: N/A--no antiplatelet due to hx of recurrent GIB  4/25- d/w family- they agree that Lovenox  not appropriate at this time.   -06/09/23 ordered dopplers to r/o clots given low grade temps recently  4/28- LE Dopplers (-) for DVT 3. Pain Management:  oxycodone  prn. Gabapentin  300 mg/hs.              --has Flexeril  10 mg TID prn              - patient with intermittent, severe b/l headache - add topomax 25 mg BID    4/19- pt rating pain 07/19/08 which is "tolerable"  4/22- pain slightly better this AM  4/25- fewer complaints of pain -06/09/23 having more b/l knee pain and using percocets more-- could be causing confusion? Advised judicious use, might benefit from knee aspirations/injections? 4/28- less knee pain this AM 4/30- pain about the same- just started Prednisone  yesterday per Ortho Steffanie Edouard request 5/1- fewer complaints about knee pain 5/2- new pain is doing "well" on prednisone , hopefully does better once prednisone  is stopped after 3 days 5/6- Pt's knees- R knee especially started hurting again yesterday after Steroid dosing- can take Percocet as needed for knees 5/7- got Ortho to come and take fluid off R knee- got 100cc off- pain is no more than 1/10 this AM! 4. Mood/Behavior/Sleep: LCSW to follow for evaluation and support             -antipsychotic agents: N/A             --melatonin prn for insomnia.  -06/09/23 poor sleep,  d/t interruptions, asked nursing to minimize interruptions between 11p-5a (apparently they can't put a sign up, but will communicate in huddle) 5. Neuropsych/cognition: This patient is capable of making decisions on his own behalf. -06/09/23 son concerned with confusion, could be from poor sleep/pain meds vs cefepime  toxicity; getting head CT, dopplers, consulted ID Dr. Levern Reader as below 4/28- cognitively intact this AM 4/30- 5/3-doing well cognitively and brighter affect 6. Skin/Wound Care: Routine pressure relief measures.   4/19- due to ulcers on legs, on low air loss mattress 7. Fluids/Electrolytes/Nutrition: Strict I/O. Daily weights. Labs with HD. 8. ESRD- HD MWF: Schedule HD at the end of the day to help with tolerance of therapy             --Sensipar  and Renvela  for metabolic bone disease.   - Patient reports no longer makes urine  4/20- pt getting HD this AM- last HD was Wednesday.    9. Anemia of chronic disease: Monitor H/H, aranesp   -4/21 HGB lower 7.5, FOBT, recheck tomorrow  4/22- HB 8.7 on today's check-con't to monitor  -06/09/23 hgb 8.5 yesterday  4/28- Hb down to 7.7- 4/29- will try to recheck Thursday  4/30- if they don't check in HD, will check tomorrow 5/1- Hb down  to 7.3- is Jehovah's Witness, so doesn't want transfusions? 5/6- Hb still 7.3- isn't dropping more- No blood or dark stools per pt- no signs of GI loss. 5/7- pt refused labs this AM- wants to get in HD  10. H/o recurrent GIB/Jehovah's Witness: Continue Protonix  BID 11. H/o Hypotension: Has had drop  in BP with HD. Now cervical SCI.  --Monitor for symptoms day after HD and with activity. -4/18 baseline blood pressure stable, monitor with activity 4/19- BP actually slightly elevated today- 140s-150's- con't regimen, since can drop with HD and therapy 4/20- BP actually doing better today- in 100s'110s- however HR elevated, likely to fluid overload 4/21 nephrology started midodrine  10mg  before dialysis, BP  appears to be doing a little better today 4/22- less hypotensive this AM laying- will monitor for [facility] 4/23- Tachycardia slightly better- in high 90's- having some intermittent elevated BP's- will monitor 4/24- BP running 140-s150s, but don't want to drop it since having HD 4/25- Having Hypotension with therapy, so don't want to reduce BP at all- doing TED's and ACE wraps- occ elevated, but with HD and therapy, not appropriate to reduce levels 4/29-5/4-  BP controlled- softer on dialysis days but stable; con't regimen 5/5- 5/7BP controlled- con't regimen Vitals:   06/17/23 1533 06/17/23 1600 06/17/23 1630 06/17/23 1704  BP: 124/80 128/79 135/88 (!) 158/93   06/17/23 1726 06/17/23 1730 06/17/23 2047 06/18/23 0545  BP: 131/79 135/84 (!) 146/82 132/76   06/18/23 1454 06/18/23 2020 06/19/23 0421 06/19/23 0451  BP: 124/74 (!) 154/90 94/69 100/67     12. Constipation: Small BM on 04/14 --had of nulytey yesterday and another today? -Xray 4/17 without excessive stool, patient reports stool still little hard yesterday.  Will increase MiraLAX  to 3 times a day, continue Senokot twice a day. Suppository PRN   06/09/23 LBM yesterday, cont regimen 4/28- LBM overnight- has been refusing miralax - made it prn- taking metamucil  4/30- asking for Sorbitol  today since needs ot have bigger BM  5/6- LBM yesterday  5/7- feels stools, although usually small, are enough- doesn't want to have accident, so not willing to take Sorbitol  13. ?left knee effusion. Not currently bothersome. Consider imaging.  14.  SIRS- Fever/hypotension/tachycardia - 4/21 Currently afebrile, he got to 50 cc IVF yesterday, CXR negative, EKG sinus tachycardia. Patient was started on Maxipime  and vancomycin  yesterday.  Appears to be doing much better today.  WBC not elevated at 7.1.  Continue to follow blood cultures 4/22- Blood Cx's (-) so far- WBC 7.3- will con't IV ABX until know final results of blood Cx's- CXR (-)- but temp was  103.2 yesterday overnight.  4/23- Blood C'x (-) x2 days so far- T 99.9 this Am after a few sips of coffee- was 100.4 this AM- will con't IV ABX since concerned about possible bacteremia- esp since having low grade temps still 4/24- No more fevers, even low grade- no growth on blood Cx' bu it says that didn't get enough blood, so much be falsely negative 4/25- No growth for 5 days, but it also said not enough blood to be sure was a true negative. Will change stop date til tomorrow -06/08/23 at 3am temp recorded as 100.3 but 5am was 98.3; will get CBC w/diff, monitor temps closely today, if continued low grade temps, will reach out to ID. BCx NGTD x5d. Didn't get vanc yesterday? Or cefepime ? Unclear why. Cefepime  started this morning at 0011. -06/09/23 temp 100.3 at 3am yesterday and then 100 later in the day,  but no fevers since, no tachycardia.  -WBC yesterday 8.9.  -Now with some confusion; spoke with Dr. Levern Reader of ID, stated cefepime  could be the cause of the confusion -recommended we stop all abx (which ended yesterday anyway) -watch the patient OFF abx -if fevers >100.3 occur then she recommended repeat BCx and CXR and could call her back to see if any abx would be advised at that time.  -Head CT and dopplers ordered as well, as above 4/28- CT looks good- no fever, WBC is OK- off ABX 4/29- Also Dopplers (-) feeling good this AM 06/16/23 seems to have resolved. 15. Tachycardia 4/22- doing better- right at 100 this AM- will monitor- likely can be from dregs of infection vs OH. -will monitor  4/23- Tachycardia improving- 4/24-27 HR in 90's right now- doing better  4/29- 4/30- HR running in 90s-low 100's today 16. R hand swelling  4/23- doing ice- will do xrays 4/24- Xray doesn't look like cause of swelling, however could be due to new shunt? 4/30- resolved 17. Hypocalcemia 4/24- his Albumin  is 2.1, so probably ok, and phos coming down- will leave to renal- thank you for their help  5/1- Ca  9.3 18. Hyperkalemia 4/28- K+ 6.3 today- got hypoosmotic bath to reduce K+ per note from RN/HD nurse? Also has Na of 127- being addressed by HD/renal- Phos also 5.8- but again, getting HD today 5/1- K+ down to 5.6 and Na 129 5/6- Na 133 and K+ down to 4.7 from 5.6 5/7- refused labs this AM- wants in HD 19. R>L knee pain  4/29- will ask Ortho if they will tap his R knee- I have no problem doing injection of knee, however with so much fluid, I would rather Ortho tap him and then do steroid injection.   4/30- we added Prednisone  40 mg daily x 3 days- to see if helps knee pain  5/1- mild reduction in pain so far  5/6- Pt has short term improvement from Prednisone  dosing for knees- con't percocet prn  5/7- since pt was doing so much worse with therapy, asked Ortho to come and tap knee and take off fluid- do steroid injection- they drew off 100CC- 5 different draws required- fluid is pending, but has a lot of WBCs- no organisms on gram stain.    I spent a total of 43   minutes on total care today- >50% coordination of care- due to  D/w pt as well as review of notes and labs on Knee fluid analysis- also educated pt why need labs.    LOS: 20 days A FACE TO FACE EVALUATION WAS PERFORMED  Rotha Cassels 06/19/2023, 8:20 AM

## 2023-06-19 NOTE — Progress Notes (Signed)
 Refused labs wanting them done at diaylsis. Educated patient about HGB. PT refused labs until later at diaylsis after education.

## 2023-06-20 LAB — RENAL FUNCTION PANEL
Albumin: 2.2 g/dL — ABNORMAL LOW (ref 3.5–5.0)
Anion gap: 15 (ref 5–15)
BUN: 61 mg/dL — ABNORMAL HIGH (ref 8–23)
CO2: 24 mmol/L (ref 22–32)
Calcium: 8.5 mg/dL — ABNORMAL LOW (ref 8.9–10.3)
Chloride: 93 mmol/L — ABNORMAL LOW (ref 98–111)
Creatinine, Ser: 6.13 mg/dL — ABNORMAL HIGH (ref 0.61–1.24)
GFR, Estimated: 10 mL/min — ABNORMAL LOW (ref >60)
Glucose, Bld: 110 mg/dL — ABNORMAL HIGH (ref 70–99)
Phosphorus: 3.8 mg/dL (ref 2.5–4.6)
Potassium: 4.5 mmol/L (ref 3.5–5.1)
Sodium: 132 mmol/L — ABNORMAL LOW (ref 135–145)

## 2023-06-20 LAB — CBC
HCT: 24.4 % — ABNORMAL LOW (ref 39.0–52.0)
Hemoglobin: 7.6 g/dL — ABNORMAL LOW (ref 13.0–17.0)
MCH: 28.3 pg (ref 26.0–34.0)
MCHC: 31.1 g/dL (ref 30.0–36.0)
MCV: 90.7 fL (ref 80.0–100.0)
Platelets: 345 10*3/uL (ref 150–400)
RBC: 2.69 MIL/uL — ABNORMAL LOW (ref 4.22–5.81)
RDW: 15.8 % — ABNORMAL HIGH (ref 11.5–15.5)
WBC: 4.9 10*3/uL (ref 4.0–10.5)
nRBC: 0 % (ref 0.0–0.2)

## 2023-06-20 MED ORDER — CHLORHEXIDINE GLUCONATE CLOTH 2 % EX PADS
6.0000 | MEDICATED_PAD | Freq: Every day | CUTANEOUS | Status: DC
  Administered 2023-06-22 – 2023-06-23 (×2): 6 via TOPICAL

## 2023-06-20 MED ORDER — SORBITOL 70 % SOLN
30.0000 mL | Freq: Once | Status: DC
  Filled 2023-06-20: qty 30

## 2023-06-20 MED ORDER — SODIUM ZIRCONIUM CYCLOSILICATE 10 G PO PACK
10.0000 g | PACK | ORAL | Status: DC
  Administered 2023-06-20 – 2023-06-26 (×4): 10 g via ORAL
  Filled 2023-06-20 (×4): qty 1

## 2023-06-20 NOTE — Progress Notes (Signed)
 Physical Therapy Session Note  Patient Details  Name: Carl Zupan Sr. MRN: [id number] Date of Birth: December 02, 1960  Today's Date: 06/20/2023 PT Individual Time: 1303-1330 PT Individual Time Calculation (min): 27 min   Short Term Goals: Week 1:  PT Short Term Goal 1 (Week 1): pt will perform STS with RW with assist of 1 PT Short Term Goal 1 - Progress (Week 1): Not met PT Short Term Goal 2 (Week 1): Pt will perform stand pivot with LRAD with assist of 1 PT Short Term Goal 2 - Progress (Week 1): Not met PT Short Term Goal 3 (Week 1): pt will initiate gait training PT Short Term Goal 3 - Progress (Week 1): Not met  Skilled Therapeutic Interventions/Progress Updates:     Pt received seated on toilet and agrees to therapy. No complaint of pain. NT present and with PT provides modA +2 for pt to stand from toilet with Stedy and cues for initiation and hand placement. Pt transfers to power WC with stedy, then drives WC to gym with cues for navigation. Following rest break, pt stands with modA +1 and ambulates x20' with RW and +2 WC follow for safety. PT notes that pt's RW is likely set too high for optimal body mechanics, so pt takes seated rest break and PT lowers RW for improved fit. Pt then stands and ambulates additional x60' with cues for upright posture to improve balance, and decreasing WB through RW for energy conservation. Pt drives WC back to room. Left seated with all needs within reach.   Therapy Documentation Precautions:  Precautions Precautions: Fall, Cervical, Other (comment) (HD MWF (RUE restricted).) Recall of Precautions/Restrictions: Impaired Precaution/Restrictions Comments: Verbally reviewed cervical precautions. Required Braces or Orthoses: Cervical Brace Cervical Brace: Hard collar Restrictions Weight Bearing Restrictions Per Provider Order: No   Therapy/Group: Individual Therapy  Neva Barban, PT, DPT 06/20/2023, 4:24 PM

## 2023-06-20 NOTE — Progress Notes (Signed)
 Patient ID: Carl Holsten Sr., male   DOB: 1960/04/08, 63 y.o.   MRN: [id number] Pt and family want to pursue short term NHP to get more rehab and then go home with son and will be time for one level apartment. Currently pt lives on the third floor. FL2 completed and will sent out

## 2023-06-20 NOTE — Progress Notes (Signed)
 Physical Therapy Session Note  Patient Details  Name: Carl Pehl Sr. MRN: [id number] Date of Birth: 12-06-1960  Today's Date: 06/20/2023 PT Individual Time: 0915-1005 PT Individual Time Calculation (min): 50 min   Short Term Goals: Week 2:  PT Short Term Goal 1 (Week 2): Pt will progress transfers to mod assist PT Short Term Goal 1 - Progress (Week 2): Met PT Short Term Goal 2 (Week 2): Pt will tolerate standing with lift equipment x 5 min preparation for gait PT Short Term Goal 2 - Progress (Week 2): Met PT Short Term Goal 3 (Week 2): Pt will be able to perform bed mobilty with min assist for supine <> sit PT Short Term Goal 3 - Progress (Week 2): Progressing toward goal Week 3:  PT Short Term Goal 1 (Week 3): Pt will perform bed mobility with min assist PT Short Term Goal 2 (Week 3): Pt will perform sit <> stands with mod A PT Short Term Goal 3 (Week 3): Pt will be able to gait x 150' with mod assist PT Short Term Goal 4 (Week 3): Pt will be able to independently perform and verbalize pressure relief techniques with PWC  Skilled Therapeutic Interventions/Progress Updates:    Session focused on NMR to address postural control retraining, BUE/BLE strengthening and coordination, transfers and gait. Pt performed slideboard transfer with PWC with overall CGA including placement of slideboard himself and managing PWC (except L hip abductor piece) independently. Focused on sit <> stands with RW with R hand orthosis with mod to max A +2 needed and elevated mat table. Once up, perfomed x 65' of gait with RW and hand orthosis with focus on upright posture, R knee control (mild buckling noted x 3 times with min manual facilitation) including turns. Blocked practice sit <> stands from elevated surface and then minimally lowered each time x 5 reps with mod/max +2 and one time with mod of 1! Transferred with RW to the PWC (walking forward x 5'). Returned to room mod I.  Therapy  Documentation Precautions:  Precautions Precautions: Fall, Cervical, Other (comment) (HD MWF (RUE restricted).) Recall of Precautions/Restrictions: Impaired Precaution/Restrictions Comments: Verbally reviewed cervical precautions. Required Braces or Orthoses: Cervical Brace Cervical Brace: Hard collar Restrictions Weight Bearing Restrictions Per Provider Order: No    Pain:  Reports R knee is doing better overall. Premedicated.    Therapy/Group: Individual Therapy  Gita Lamb Amadeo June, PT, DPT, CBIS  06/20/2023, 11:04 AM

## 2023-06-20 NOTE — Progress Notes (Signed)
 [facility] Progress Note   Subjective:    Seen and examined patient at bedside. Sitting up in the chair feeling fine. Tolerated yesterday's HD with net UF 2.5L. Next HD 5/9.  Objective Vitals:   06/19/23 1852 06/19/23 1928 06/20/23 0245 06/20/23 0500  BP: 99/62 108/67 102/68   Pulse: 99 (!) 103 83   Resp: 20 18 18    Temp: 98.2 F (36.8 C) 97.8 F (36.6 C) 98.5 F (36.9 C)   TempSrc:      SpO2: 100% 100% 99%   Weight: 94.9 kg   91.8 kg  Height:       Physical Exam General: Alert male in NAD; on RA Heart: RRR, no murmurs, rubs or gallops Lungs: CTA bilaterally, respirations unlabored Abdomen: Soft, non-distended, +BS Extremities: trace edema, wearing compression stockings Dialysis Access:  AVF + t/b  Filed Weights   06/19/23 1452 06/19/23 1852 06/20/23 0500  Weight: 97.4 kg 94.9 kg 91.8 kg    Intake/Output Summary (Last 24 hours) at 06/20/2023 0936 Last data filed at 06/20/2023 0816 Gross per 24 hour  Intake 120 ml  Output 2.5 ml  Net 117.5 ml    Additional Objective Labs: Basic Metabolic Panel: Recent Labs  Lab 06/17/23 1400 06/18/23 0516 06/19/23 1505  NA 133* 133* 132*  K 5.6* 4.7 5.5*  CL 94* 94* 95*  CO2 21* 26 22  GLUCOSE 116* 85 135*  BUN 98* 56* 98*  CREATININE 9.85* 6.55* 9.20*  CALCIUM  9.2 8.9 8.8*  PHOS 4.3 3.9 5.5*   Liver Function Tests: Recent Labs  Lab 06/17/23 1400 06/18/23 0516 06/19/23 1505  ALBUMIN  2.3* 2.0* 2.2*   No results for input(s): "LIPASE", "AMYLASE" in the last 168 hours. CBC: Recent Labs  Lab 06/14/23 1450 06/18/23 0516 06/19/23 1505  WBC 8.0 7.8 7.1  NEUTROABS  --  5.0 5.5  HGB 7.5* 7.3* 7.1*  HCT 23.6* 22.7* 22.2*  MCV 90.1 89.4 91.4  PLT 436* 396 371   Blood Culture    Component Value Date/Time   SDES FLUID 06/18/2023 1357   SPECREQUEST NONE 06/18/2023 1357   CULT  06/18/2023 1357    NO GROWTH < 24 HOURS Performed at [facility], [address] [place], [place] [address]     REPTSTATUS PENDING 06/18/2023 1357    Cardiac Enzymes: No results for input(s): "CKTOTAL", "CKMB", "CKMBINDEX", "TROPONINI" in the last 168 hours. CBG: No results for input(s): "GLUCAP" in the last 168 hours. Iron Studies: No results for input(s): "IRON", "TIBC", "TRANSFERRIN", "FERRITIN" in the last 72 hours. Lab Results  Component Value Date   INR 1.1 05/27/2023   INR 1.1 05/25/2023   INR 1.1 09/17/2021   Studies/Results: No results found.  Medications:   (feeding supplement) PROSource Plus  30 mL Oral BID BM   atorvastatin   10 mg Oral Q Thu   carbamide peroxide  5 drop Right EAR BID   cinacalcet   120 mg Oral Q supper   darbepoetin (ARANESP ) injection - DIALYSIS  150 mcg Subcutaneous Q Tue-1800   gabapentin   300 mg Oral QHS   Gerhardt's butt cream   Topical BID   pantoprazole   40 mg Oral BID AC   psyllium  1 packet Oral TID   senna-docusate  2 tablet Oral BID   sevelamer  carbonate  3,200 mg Oral TID WC   topiramate   25 mg Oral BID    Dialysis Orders: OP Dialysis Orders [facility] - MWF 4h  B400   85.5kg  2K bath  AVF   - Heparin  3000 units IV three times per week  - Mircera 60mcg IV q 2 weeks- last dose 05/24/23  - Calcitriol  1.5mcg PO q HD  Assessment/Plan: 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 Now in CIR ESRD: Now back on MWF schedule. Next HD 5/9. Hyperkalemia: K+ levels in the 5s lately. Noted snacks at bedside. Noted daily Lokelma  ordered in outpatient. Will start Lokelma  on non-HD days while here.  HTN/volume: BP controlled/stable, still above EDW with some edema on exam, push UFG as tolerated Anemia of ESRD: Hgb 7.1, aranesp  dose recently increased and given 5/6, continue to titrate up if needed. 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 It was noted in our outpatient records of no blood products except in emergent situations. Secondary HPTH: CorrCa >10, calcitriol  on hold for now/Phos now at goal, continue sevelamer  and  sensipar  Nutrition: Alb 2.0, continue protein supplements  [name], NP [facility] 06/20/2023,9:36 AM  LOS: 21 days

## 2023-06-20 NOTE — Group Note (Signed)
 Patient Details Name: Carl Condry Sr. MRN: [id number] DOB: 08/01/1960 Today's Date: 06/20/2023  Goal:  Pt will actively participate in 60 minute therapeutic dance group with supervision.  MET Group Description: Dance Group: Pt participated in dance group with an emphasis on social interaction, motor planning, increasing overall activity tolerance and bimanual tasks. All songs were selected by group members. Dance moves included AROM of BUE/BLE gross motor movements with an emphasis on building functional endurance.    Individual level documentation: Patient completed group from sitting level in power w/c.  Pt independently utilized power chair setting to adjust chair to maximize safe participation.   Patient needed supervision to complete various dance moves with cues for instruction/demonstration.  Patient needed minimal modifications during group and initiated modifications with mod I.  Pt demonstrated good safety awareness throughout the session.  Pain:no c/o   [name] 06/20/2023, 4:05 PM

## 2023-06-20 NOTE — Progress Notes (Signed)
 PROGRESS NOTE   Subjective/Complaints:   Pt reports wants sorbitol  today after therapy- was ordered.  Not able to have BM this Am on toilet.  Asking why needs labs before HD on HD day- explained because we can then act on them- when come back in afternoon, not able ot act on them til after 5pm usually.  R knee better- feels did better in PT- but yesterday was "wobbly" per note.  Asking about having tomato with renal diet.   Hb 7.1 K+ 5.5 ROS:     Pt denies SOB, abd pain, CP, N/V/C/D, and vision changes    B/L R>L knee pain (+)- R knee very improved  Objective:   No results found.     Recent Labs    06/18/23 0516 06/19/23 1505  WBC 7.8 7.1  HGB 7.3* 7.1*  HCT 22.7* 22.2*  PLT 396 371    Recent Labs    06/18/23 0516 06/19/23 1505  NA 133* 132*  K 4.7 5.5*  CL 94* 95*  CO2 26 22  GLUCOSE 85 135*  BUN 56* 98*  CREATININE 6.55* 9.20*  CALCIUM  8.9 8.8*     Intake/Output Summary (Last 24 hours) at 06/20/2023 0951 Last data filed at 06/20/2023 0816 Gross per 24 hour  Intake 120 ml  Output 2.5 ml  Net 117.5 ml        Physical Exam: Vital Signs Blood pressure 102/68, pulse 83, temperature 98.5 F (36.9 C), resp. rate 18, height 6\' 1"  (1.854 m), weight 91.8 kg, SpO2 99%.        General: awake, alert, appropriate,  sitting up in power w/c; scrunched up a little in w/c, but getting fixed by NT; NT in room; NAD HENT: conjugate gaze; oropharynx moist CV: regular rate and rhythm; no JVD Pulmonary: CTA B/L; no W/R/R- good air movement GI: soft, NT, ND, (+)BS- hypoactive Psychiatric: appropriate Neurological: Ox3 MSK- R knee much less swollen PRIOR EXAMS: MsK: B/L knees with effusions- less TTP on R knee this AM , no erythema, not really focally warm to touch- Both have effusions, - R hand swelling looks good/resolved  MSK- R hand swelling esp ulnar side of hand/dorsum and L wrist-stable this  AM RUE- Biceps 4/5; WE 4-/5; Triceps 2/5; Grip 2-/5 and FA 1/5 LUE- 4/5 except triceps 3+ to 4-/5 RLE- HF 1 to 2-/5; KE 2-/5; DF/PF 2-/5 LLE- HF 3/5; KE 4-/5; DF/PF 4/5    Skin: C/D/I. Multiple lesions on BL legs with various stages of healing.  + RUE fistula - intact   MSK:      LLE knee with palpable effusion, no warmth, no ttp Mood: Pleasant affect, appropriate mood.  Neurologic exam: Alert and oriented x 4, fluent, follows commands, cranial nerves II through XII grossly intact, no hypertonia noted  Sensation: Reduced to light touch in distal right lower extremity to the mid-thigh       Strength:                RUE: 2/5 SA, 2/5 EF, 2/5 EE, 2/5 WE, 2/5 FF, 2/5 FA                LUE:  3/5  SA, 3/5 EF, 3/5 EE, 3/5 WE, 3/5 FF, 3/5 FA                RLE: 2/5 HF, 2/5 KE, 2/5  DF, 2/5  EHL, 2/5  PF                 LLE:  3/5 HF, 4/5 KE, 4/5  DF, 4/5  EHL, 4/5  PF  Neuroexam stable 4/21  Assessment/Plan: 1. Functional deficits which require 3+ hours per day of interdisciplinary therapy in a comprehensive inpatient rehab setting. Physiatrist is providing close team supervision and 24 hour management of active medical problems listed below. Physiatrist and rehab team continue to assess barriers to discharge/monitor patient progress toward functional and medical goals  Care Tool:  Bathing    Body parts bathed by patient: Right arm, Chest, Abdomen, Right upper leg, Left upper leg, Face   Body parts bathed by helper: Left arm, Front perineal area, Buttocks, Right lower leg, Left lower leg     Bathing assist Assist Level: Moderate Assistance - Patient 50 - 74%     Upper Body Dressing/Undressing Upper body dressing   What is the patient wearing?: Pull over shirt    Upper body assist Assist Level: Minimal Assistance - Patient > 75%    Lower Body Dressing/Undressing Lower body dressing      What is the patient wearing?: Pants     Lower body assist Assist for lower body dressing:  Moderate Assistance - Patient 50 - 74%     Toileting Toileting Toileting Activity did not occur (Clothing management and hygiene only): N/A (no void or bm) (anuric, HD patient)  Toileting assist Assist for toileting: Maximal Assistance - Patient 25 - 49%     Transfers Chair/bed transfer  Transfers assist     Chair/bed transfer assist level: 2 Helpers (CGA with slideboard; +2 for stand step with Blanca Bunch)     Locomotion Ambulation   Ambulation assist   Ambulation activity did not occur: Safety/medical concerns  Assist level: 2 helpers Assistive device: Blanca Bunch Max distance: 15'   Walk 10 feet activity   Assist  Walk 10 feet activity did not occur: Safety/medical concerns  Assist level: 2 helpers Assistive device: Walker-Eva   Walk 50 feet activity   Assist Walk 50 feet with 2 turns activity did not occur: Safety/medical concerns  Assist level: 2 helpers Assistive device: [employer] 150 feet activity   Assist Walk 150 feet activity did not occur: Safety/medical concerns  Assist level: 2 helpers      Walk 10 feet on uneven surface  activity   Assist Walk 10 feet on uneven surfaces activity did not occur: Safety/medical concerns         Wheelchair     Assist Is the patient using a wheelchair?: Yes Type of Wheelchair: Power    Wheelchair assist level: Independent Max wheelchair distance: 150    Wheelchair 50 feet with 2 turns activity    Assist        Assist Level: Independent   Wheelchair 150 feet activity     Assist      Assist Level: Independent   Blood pressure 102/68, pulse 83, temperature 98.5 F (36.9 C), resp. rate 18, height 6\' 1"  (1.854 m), weight 91.8 kg, SpO2 99%.  Medical Problem List and Plan: 1. 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  2.  Antithrombotics: -DVT/anticoagulation:  Mechanical: Sequential compression devices, below knee Bilateral lower extremities             -antiplatelet therapy: N/A--no antiplatelet due to hx of recurrent GIB  4/25- d/w family- they agree that Lovenox  not appropriate at this time.   -06/09/23 ordered dopplers to r/o clots given low grade temps recently  4/28- LE Dopplers (-) for DVT 3. Pain Management:  oxycodone  prn. Gabapentin  300 mg/hs.              --has Flexeril  10 mg TID prn              - patient with intermittent, severe b/l headache - add topomax 25 mg BID    4/19- pt rating pain 07/19/08 which is "tolerable"  4/22- pain slightly better this AM  4/25- fewer complaints of pain -06/09/23 having more b/l knee pain and using percocets more-- could be causing confusion? Advised judicious use, might benefit from knee aspirations/injections? 4/28- less knee pain this AM 4/30- pain about the same- just started Prednisone  yesterday per Ortho Steffanie Edouard request 5/1- fewer complaints about knee pain 5/2- new pain is doing "well" on prednisone , hopefully does better once prednisone  is stopped after 3 days 5/6- Pt's knees- R knee especially started hurting again yesterday after Steroid dosing- can take Percocet as needed for knees 5/7- got Ortho to come and take fluid off R knee- got 100cc off- pain is no more than 1/10 this AM! 5/8- knee still doing well 4. Mood/Behavior/Sleep: LCSW to follow for evaluation and support             -antipsychotic agents: N/A              --melatonin prn for insomnia.  -06/09/23 poor sleep, d/t interruptions, asked nursing to minimize interruptions between 11p-5a (apparently they can't put a sign up, but will communicate in huddle) 5. Neuropsych/cognition: This patient is capable of making decisions on his own behalf. 5/8- Ox3 and appropriate 6. Skin/Wound Care: Routine pressure relief measures.   4/19- due to ulcers on legs, on low air loss mattress 7. Fluids/Electrolytes/Nutrition: Strict I/O. Daily weights. Labs with HD. 8. ESRD- HD MWF: Schedule HD at the end of the day to help with tolerance of therapy             --Sensipar  and Renvela  for metabolic bone disease.   - Patient reports no longer makes urine  4/20- pt getting HD this AM- last HD was Wednesday.    9. Anemia of chronic disease: Monitor H/H, aranesp   -4/21 HGB lower 7.5, FOBT, recheck tomorrow  4/22- HB 8.7 on today's check-con't to monitor  -06/09/23 hgb 8.5 yesterday  4/28- Hb down to 7.7- 4/29- will try to recheck Thursday  4/30- if they don't check in HD, will check tomorrow 5/1- Hb down to 7.3- is Jehovah's  Witness, so doesn't want transfusions? 5/6- Hb still 7.3- isn't dropping more- No blood or dark stools per pt- no signs of GI loss. 5/7- pt refused labs this AM- wants to get in HD  5/8- Hb 7.1 yesterday after HD- did explained to pt like to get labs before HD so can act on them- will d/w pt again about transfusion? 10. H/o recurrent GIB/Jehovah's Witness: Continue Protonix  BID 11. H/o Hypotension: Has had drop  in BP with HD. Now cervical SCI.  --Monitor for symptoms day after HD and with activity. -4/18 baseline blood pressure stable, monitor with activity 4/19- BP actually slightly elevated today- 140s-150's- con't regimen, since can drop with HD and therapy 4/20- BP actually doing better today- in 100s'110s- however HR elevated, likely to fluid overload 4/21 nephrology started midodrine  10mg  before dialysis, BP appears to be doing a little better  today 4/22- less hypotensive this AM laying- will monitor for [facility] 4/23- Tachycardia slightly better- in high 90's- having some intermittent elevated BP's- will monitor 4/24- BP running 140-s150s, but don't want to drop it since having HD 4/25- Having Hypotension with therapy, so don't want to reduce BP at all- doing TED's and ACE wraps- occ elevated, but with HD and therapy, not appropriate to reduce levels 4/29-5/4-  BP controlled- softer on dialysis days but stable; con't regimen 5/5- 5/7BP controlled- con't regimen 5/8- BP on low side in last 24 hours- likely made worse due to HD yesterday- denies OH Sx's. Con't to monitor trend Vitals:   06/19/23 1607 06/19/23 1622 06/19/23 1636 06/19/23 1650  BP: 107/65 94/63 103/61 109/68   06/19/23 1720 06/19/23 1750 06/19/23 1820 06/19/23 1835  BP: 101/81 (!) 114/94 96/65 (!) 91/56   06/19/23 1850 06/19/23 1852 06/19/23 1928 06/20/23 0245  BP: (!) 98/58 99/62 108/67 102/68     12. Constipation: Small BM on 04/14 --had of nulytey yesterday and another today? -Xray 4/17 without excessive stool, patient reports stool still little hard yesterday.  Will increase MiraLAX  to 3 times a day, continue Senokot twice a day. Suppository PRN   06/09/23 LBM yesterday, cont regimen 4/28- LBM overnight- has been refusing miralax - made it prn- taking metamucil  4/30- asking for Sorbitol  today since needs ot have bigger BM  5/6- LBM yesterday  5/7- feels stools, although usually small, are enough- doesn't want to have accident, so not willing to take Sorbitol   5/8- wants sorbitol  today after therapy- ordered 13. ?left knee effusion. Not currently bothersome. Consider imaging.  14.  SIRS- Fever/hypotension/tachycardia - 4/21 Currently afebrile, he got to 50 cc IVF yesterday, CXR negative, EKG sinus tachycardia. Patient was started on Maxipime  and vancomycin  yesterday.  Appears to be doing much better today.  WBC not elevated at 7.1.  Continue to follow blood  cultures 4/22- Blood Cx's (-) so far- WBC 7.3- will con't IV ABX until know final results of blood Cx's- CXR (-)- but temp was 103.2 yesterday overnight.  4/23- Blood C'x (-) x2 days so far- T 99.9 this Am after a few sips of coffee- was 100.4 this AM- will con't IV ABX since concerned about possible bacteremia- esp since having low grade temps still 4/24- No more fevers, even low grade- no growth on blood Cx' bu it says that didn't get enough blood, so much be falsely negative 4/25- No growth for 5 days, but it also said not enough blood to be sure was a true negative. Will change stop date til tomorrow -06/08/23 at 3am temp recorded  as 100.3 but 5am was 98.3; will get CBC w/diff, monitor temps closely today, if continued low grade temps, will reach out to ID. BCx NGTD x5d. Didn't get vanc yesterday? Or cefepime ? Unclear why. Cefepime  started this morning at 0011. -06/09/23 temp 100.3 at 3am yesterday and then 100 later in the day, but no fevers since, no tachycardia.  -WBC yesterday 8.9.  -Now with some confusion; spoke with Dr. Levern Reader of ID, stated cefepime  could be the cause of the confusion -recommended we stop all abx (which ended yesterday anyway) -watch the patient OFF abx -if fevers >100.3 occur then she recommended repeat BCx and CXR and could call her back to see if any abx would be advised at that time.  -Head CT and dopplers ordered as well, as above 4/28- CT looks good- no fever, WBC is OK- off ABX 4/29- Also Dopplers (-) feeling good this AM 06/16/23 seems to have resolved. 15. Tachycardia 4/22- doing better- right at 100 this AM- will monitor- likely can be from dregs of infection vs OH. -will monitor  4/23- Tachycardia improving- 4/24-27 HR in 90's right now- doing better  4/29- 4/30- HR running in 90s-low 100's today 16. R hand swelling  4/23- doing ice- will do xrays 4/24- Xray doesn't look like cause of swelling, however could be due to new shunt? 4/30- resolved 17.  Hypocalcemia 4/24- his Albumin  is 2.1, so probably ok, and phos coming down- will leave to renal- thank you for their help  5/1- Ca 9.3 5/8- Ca 8.8 18. Hyperkalemia 4/28- K+ 6.3 today- got hypoosmotic bath to reduce K+ per note from RN/HD nurse? Also has Na of 127- being addressed by HD/renal- Phos also 5.8- but again, getting HD today 5/1- K+ down to 5.6 and Na 129 5/6- Na 133 and K+ down to 4.7 from 5.6 5/7- refused labs this AM- wants in HD 19. R>L knee pain  4/29- will ask Ortho if they will tap his R knee- I have no problem doing injection of knee, however with so much fluid, I would rather Ortho tap him and then do steroid injection.   4/30- we added Prednisone  40 mg daily x 3 days- to see if helps knee pain  5/1- mild reduction in pain so far  5/6- Pt has short term improvement from Prednisone  dosing for knees- con't percocet prn  5/7- since pt was doing so much worse with therapy, asked Ortho to come and tap knee and take off fluid- do steroid injection- they drew off 100CC- 5 different draws required- fluid is pending, but has a lot of WBCs- no organisms on gram stain.   5/8- no growth for 24 hours- R knee pain so much better   I spent a total of 43   minutes on total care today- >50% coordination of care- due to  [facility] to see pt again about Hb- dropped to 7.1-  as well as d/w pt about sorbitol - also d/w nursing about his drop in BP in last 24 hours- reviewed therapy notes and vitals    LOS: 21 days A FACE TO FACE EVALUATION WAS PERFORMED  Kirtis Challis 06/20/2023, 9:51 AM

## 2023-06-20 NOTE — Plan of Care (Signed)
  Problem: Consults Goal: RH SPINAL CORD INJURY PATIENT EDUCATION Description:  See Patient Education module for education specifics.  Outcome: Progressing   Problem: SCI BOWEL ELIMINATION Goal: RH STG MANAGE BOWEL WITH ASSISTANCE Description: STG Manage Bowel with minimal Assistance. Outcome: Progressing   Problem: RH SKIN INTEGRITY Goal: RH STG SKIN FREE OF INFECTION/BREAKDOWN Description: Manage skin free of infection/breakdown with minimal assistance Outcome: Progressing   Problem: RH SAFETY Goal: RH STG ADHERE TO SAFETY PRECAUTIONS W/ASSISTANCE/DEVICE Description: STG Adhere to Safety Precautions With minimal Assistance/Device. Outcome: Progressing   Problem: RH PAIN MANAGEMENT Goal: RH STG PAIN MANAGED AT OR BELOW PT'S PAIN GOAL Description: Pain<4 w/ prns Outcome: Progressing

## 2023-06-20 NOTE — Group Note (Signed)
 Patient Details Name: Shamarcus Ingram Sr. MRN: [id number] DOB: Jul 15, 1960 Today's Date: 06/20/2023  Time Calculation: OT Group Time Calculation OT Group Start Time: 1430 OT Group Stop Time: 1530 OT Group Time Calculation (min): 60 min      Group Description: Dance Group: Pt participated in dance group with an emphasis on social interaction, motor planning, increasing overall activity tolerance and bimanual tasks. All songs were selected by group members. Dance moves included AROM of BUE/BLE gross motor movements with an emphasis on building functional endurance.    Individual level documentation: Patient completed group from sitting level. Patient needed supervision to complete various dance moves with cues for body positioning.  Patient needed min modifications during group.  Pain:  0/10  Precautions:  Cervical, [place]  Geoffery Kiel 06/20/2023, 3:51 PM

## 2023-06-20 NOTE — NC FL2 (Signed)
 [place]  MEDICAID FL2 LEVEL OF CARE FORM     IDENTIFICATION  Patient Name: [name] Sr. Birthdate: 1960/12/02 Sex: male Admission Date (Current Location): 05/30/2023  [place] and [place] Number:  Ernesto Heady [id number] L Facility and Address:  [facility], [address] [place], [place] [address]      Provider Number: [id number]  Attending Physician Name and Address:  Lovorn, Megan, MD  Relative Name and Phone Number:  Levell Uplinger [phone]    Current Level of Care: Other (Comment) (rehab) Recommended Level of Care: Skilled Nursing Facility Prior Approval Number:    Date Approved/Denied:   PASRR Number: [id number]  Discharge Plan: SNF    Current Diagnoses: Patient Active Problem List   Diagnosis Date Noted   Coping style affecting medical condition 06/05/2023   Spinal cord injury, cervical region, sequela (HCC) 05/30/2023   Cervical vertebral fracture (HCC) 05/25/2023   History of GI bleed 05/25/2023   C4 cervical fracture (HCC) 05/25/2023   Duodenitis    Gastritis and gastroduodenitis    Ischemic colitis (HCC)    Bright red blood per rectum    ABLA (acute blood loss anemia)    Gastric ulcer    Duodenal ulcer    Diverticulosis of colon without hemorrhage    GI bleed 09/17/2021   Acute upper GI bleeding 09/11/2021   Mucosal abnormality of esophagus    Mucosal abnormality of stomach    Mucosal abnormality of duodenum    Upper GI bleed    Left hip pain 11/16/2020   Type 2 diabetes mellitus with other specified complication (HCC) 11/14/2020   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/14/2020   Primary osteoarthritis of left hip 10/07/2020   Status post reverse total arthroplasty of right shoulder 08/24/2020   Diverticulitis 03/08/2020   ESRD (end stage renal disease) (HCC) 03/08/2020   Polyneuropathy in diseases classified elsewhere (HCC) 03/08/2020   Pure hypercholesterolemia 03/08/2020   Sacral back pain 03/08/2020    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 11/10/2019   Acute gastric ulcer with hemorrhage    Acute GI bleeding 07/31/2019   Hypotension of hemodialysis 07/31/2019   Diverticulitis large intestine 07/31/2019   Mass of soft tissue of shoulder 05/26/2019   Awaiting organ transplant status 03/18/2019   Finding of cocaine in blood 02/25/2019   Anaphylactic shock, unspecified, sequela 11/21/2018   Hypercalcemia 10/28/2018   Diarrhea, unspecified 08/08/2018   Hyperkalemia 03/12/2018   Renal cell carcinoma of left kidney (HCC) 12/24/2017   Fluid overload, unspecified 07/06/2017   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   Other specified disorders of kidney and ureter 11/22/2016   Secondary hyperparathyroidism of renal origin (HCC) 10/23/2016   Headache disorder 08/21/2016   TIA (transient ischemic attack)    Essential hypertension    Seizure (HCC)    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    Lung nodule    Diverticulitis of cecum 07/21/2015   Diverticulitis of large intestine without perforation or abscess without bleeding    Right lower quadrant pain    Obstructive sleep apnea 02/08/2015   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   Diverticulosis 07/13/2014   Lower GI bleed 07/12/2014   Refusal of blood transfusions as patient is Jehovah's Witness    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   HLD (hyperlipidemia)    PRES (posterior reversible encephalopathy syndrome)    History of CVA (cerebrovascular accident) 05/23/2014   Anemia of chronic disease 05/23/2014  GERD (gastroesophageal reflux disease) 12/09/2012   Gastrointestinal bleeding 11/12/2012   Melena 11/12/2012   Other psychoactive substance dependence, uncomplicated (HCC) 10/08/2012   Iron deficiency anemia, unspecified 06/10/2012   Moderate protein-calorie malnutrition (HCC) 07/29/2009   Coagulation defect, unspecified (HCC) 08/29/2006   Type 2  diabetes mellitus with diabetic peripheral angiopathy without gangrene (HCC) 08/29/2006   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 stage renal disease (HCC) 08/02/2006    Orientation RESPIRATION BLADDER Height & Weight     Self, Time, Situation, Place  Normal Continent Weight: 202 lb 6.1 oz (91.8 kg) Height:  6\' 1"  (185.4 cm)  BEHAVIORAL SYMPTOMS/MOOD NEUROLOGICAL BOWEL NUTRITION STATUS      Continent Diet (renal Diet 1200 fluid restriction-thin liquids)  AMBULATORY STATUS COMMUNICATION OF NEEDS Skin   Extensive Assist Verbally Surgical wounds                       Personal Care Assistance Level of Assistance  Bathing, Dressing Bathing Assistance: Limited assistance   Dressing Assistance: Limited assistance     Functional Limitations Info             SPECIAL CARE FACTORS FREQUENCY  PT (By licensed PT), OT (By licensed OT)     PT Frequency: 5x week OT Frequency: 5x week            Contractures Contractures Info: Not present    Additional Factors Info  Code Status, Allergies Code Status Info: Full Code Allergies Info: Infed, Vicodin, Diclofenac           Current Medications (06/20/2023):  This is the current hospital active medication list Current Facility-Administered Medications  Medication Dose Route Frequency Provider Last Rate Last Admin   (feeding supplement) PROSource Plus liquid 30 mL  30 mL Oral BID BM Rubbie Cornwall, NP   30 mL at 06/20/23 1343   acetaminophen  (TYLENOL ) tablet 325-650 mg  325-650 mg Oral Q4H PRN Love, Pamela S, PA-C   325 mg at 06/18/23 1237   atorvastatin  (LIPITOR) tablet 10 mg  10 mg Oral Q Thu Love, Pamela S, PA-C   10 mg at 06/20/23 1117   bisacodyl  (DULCOLAX) suppository 10 mg  10 mg Rectal Daily PRN Love, Pamela S, PA-C       carbamide peroxide (DEBROX) 6.5 % OTIC (EAR) solution 5 drop  5 drop Right EAR BID Love, Pamela S, PA-C   5 drop at 06/20/23 9147   Chlorhexidine  Gluconate  Cloth 2 % PADS 6 each  6 each Topical Q0600 Anderson, Courtney E, NP       cinacalcet  (SENSIPAR ) tablet 120 mg  120 mg Oral Q supper Love, Pamela S, PA-C   120 mg at 06/19/23 2105   cyclobenzaprine  (FLEXERIL ) tablet 10 mg  10 mg Oral TID PRN Love, Pamela S, PA-C   10 mg at 06/19/23 2052   Darbepoetin Alfa  (ARANESP ) injection 150 mcg  150 mcg Subcutaneous Q Tue-1800 Brown, Rita Harbison, NP   150 mcg at 06/18/23 1728   diphenhydrAMINE  (BENADRYL ) capsule 25 mg  25 mg Oral Q6H PRN Love, Pamela S, PA-C       gabapentin  (NEURONTIN ) capsule 300 mg  300 mg Oral QHS Love, Pamela S, PA-C   300 mg at 06/19/23 2052   Gerhardt's butt cream   Topical BID Lovorn, Megan, MD   Given at 06/19/23 0848   guaiFENesin -dextromethorphan (ROBITUSSIN DM) 100-10 MG/5ML syrup 5-10 mL  5-10 mL Oral Q6H PRN  Love, Pamela S, PA-C       melatonin tablet 5 mg  5 mg Oral QHS PRN Love, Pamela S, PA-C   5 mg at 06/18/23 2022   midodrine  (PROAMATINE ) tablet 10 mg  10 mg Oral Q dialysis Clementine Cutting, MD       milk and molasses enema  1 enema Rectal Daily PRN Love, Pamela S, PA-C       oxyCODONE -acetaminophen  (PERCOCET/ROXICET) 5-325 MG per tablet 1-2 tablet  1-2 tablet Oral Q4H PRN Love, Pamela S, PA-C   2 tablet at 06/20/23 0123   pantoprazole  (PROTONIX ) EC tablet 40 mg  40 mg Oral BID AC Love, Pamela S, PA-C   40 mg at 06/20/23 0840   phenol (CHLORASEPTIC) mouth spray 1 spray  1 spray Mouth/Throat PRN Love, Pamela S, PA-C       polyethylene glycol (MIRALAX  / GLYCOLAX ) packet 17 g  17 g Oral BID PRN Lovorn, Megan, MD       prochlorperazine  (COMPAZINE ) tablet 5-10 mg  5-10 mg Oral Q6H PRN Love, Pamela S, PA-C       Or   prochlorperazine  (COMPAZINE ) suppository 12.5 mg  12.5 mg Rectal Q6H PRN Love, Pamela S, PA-C       Or   prochlorperazine  (COMPAZINE ) injection 5-10 mg  5-10 mg Intravenous Q6H PRN Love, Pamela S, PA-C       psyllium (HYDROCIL/METAMUCIL) 1 packet  1 packet Oral TID Love, Pamela S, PA-C   1 packet at 06/20/23  1343   senna-docusate (Senokot-S) tablet 2 tablet  2 tablet Oral BID Love, Pamela S, PA-C   2 tablet at 06/20/23 1116   sevelamer  carbonate (RENVELA ) tablet 3,200 mg  3,200 mg Oral TID WC Lovorn, Megan, MD   3,200 mg at 06/20/23 1123   sodium zirconium cyclosilicate  (LOKELMA ) packet 10 g  10 g Oral QODAY Anderson, Courtney E, NP   10 g at 06/20/23 1117   sorbitol  70 % solution 30 mL  30 mL Oral Daily PRN Lovorn, Megan, MD   30 mL at 06/13/23 0960   sorbitol  70 % solution 30 mL  30 mL Oral Once Lovorn, Megan, MD       topiramate  (TOPAMAX ) tablet 25 mg  25 mg Oral BID Engler, [name] C, DO   25 mg at 06/20/23 0840     Discharge Medications: Please see discharge summary for a list of discharge medications.  Relevant Imaging Results:  Relevant Lab Results:   Additional Information SSN:[id number]  [facility]  Tashaya Ancrum G, LCSW

## 2023-06-21 LAB — CBC
HCT: 24.2 % — ABNORMAL LOW (ref 39.0–52.0)
Hemoglobin: 7.6 g/dL — ABNORMAL LOW (ref 13.0–17.0)
MCH: 28.1 pg (ref 26.0–34.0)
MCHC: 31.4 g/dL (ref 30.0–36.0)
MCV: 89.6 fL (ref 80.0–100.0)
Platelets: 419 10*3/uL — ABNORMAL HIGH (ref 150–400)
RBC: 2.7 MIL/uL — ABNORMAL LOW (ref 4.22–5.81)
RDW: 15.7 % — ABNORMAL HIGH (ref 11.5–15.5)
WBC: 7.2 10*3/uL (ref 4.0–10.5)
nRBC: 0 % (ref 0.0–0.2)

## 2023-06-21 LAB — RENAL FUNCTION PANEL
Albumin: 2.3 g/dL — ABNORMAL LOW (ref 3.5–5.0)
Anion gap: 17 — ABNORMAL HIGH (ref 5–15)
BUN: 82 mg/dL — ABNORMAL HIGH (ref 8–23)
CO2: 23 mmol/L (ref 22–32)
Calcium: 8.6 mg/dL — ABNORMAL LOW (ref 8.9–10.3)
Chloride: 91 mmol/L — ABNORMAL LOW (ref 98–111)
Creatinine, Ser: 7.87 mg/dL — ABNORMAL HIGH (ref 0.61–1.24)
GFR, Estimated: 7 mL/min — ABNORMAL LOW (ref >60)
Glucose, Bld: 110 mg/dL — ABNORMAL HIGH (ref 70–99)
Phosphorus: 4.5 mg/dL (ref 2.5–4.6)
Potassium: 5.3 mmol/L — ABNORMAL HIGH (ref 3.5–5.1)
Sodium: 131 mmol/L — ABNORMAL LOW (ref 135–145)

## 2023-06-21 NOTE — Progress Notes (Signed)
 Physical Therapy Weekly Progress Note  Patient Details  Name: Carl Mcadoo Sr. MRN: [id number] Date of Birth: November 27, 1960  Beginning of progress report period: Jun 15, 2023 End of progress report period: Jun 21, 2023   Patient has met 2 of 4 short term goals.  Pt had a slow start to the week due to fatigue (likely from lower Hg) and increased R knee pain limiting his upright tolerance in standing and gait progression. Thurs/Fri this week, pt had made great gains with gait progression, using RW with R hand orthosis and performing mod to max +2 for sit <> stands form elevated surface and once up, gait with min/mod+2 for safety about 100'. Pt continues to be mod I with PWC mobility on unit and progressing SB transfers with overall close S/CGA (still needs assist with slideboard placement). Plan is likely for SNF due to inaccessible home environment and HD schedule.  Patient continues to demonstrate the following deficits muscle weakness and muscle joint tightness, decreased cardiorespiratoy endurance, abnormal tone and decreased coordination, and decreased sitting balance, decreased standing balance, decreased postural control, and decreased balance strategies and therefore will continue to benefit from skilled PT intervention to increase functional independence with mobility.  Patient progressing toward long term goals..  Continue plan of care.  PT Short Term Goals Week 3:  PT Short Term Goal 1 (Week 3): Pt will perform bed mobility with min assist PT Short Term Goal 1 - Progress (Week 3): Met PT Short Term Goal 2 (Week 3): Pt will perform sit <> stands with mod A PT Short Term Goal 2 - Progress (Week 3): Progressing toward goal PT Short Term Goal 3 (Week 3): Pt will be able to gait x 150' with mod assist PT Short Term Goal 3 - Progress (Week 3): Progressing toward goal PT Short Term Goal 4 (Week 3): Pt will be able to independently perform and verbalize pressure relief techniques with PWC PT  Short Term Goal 4 - Progress (Week 3): Met Week 4:  PT Short Term Goal 1 (Week 4): Pt will consistantly be able to perform sit <> stands with max A PT Short Term Goal 2 (Week 4): Pt will be able to perform gait x 100' with mod A PT Short Term Goal 3 (Week 4): Pt will be able to perform bed mobility with supervision  Skilled Therapeutic Interventions/Progress Updates:  Ambulation/gait training;Cognitive remediation/compensation;Discharge planning;DME/adaptive equipment instruction;Functional mobility training;Pain management;Psychosocial support;Therapeutic Activities;Splinting/orthotics;UE/LE Strength taining/ROM;Visual/perceptual remediation/compensation;Wheelchair Designer, jewellery;Therapeutic Exercise;Skin care/wound management;Patient/family education;Neuromuscular re-education;Disease management/prevention;Balance/vestibular training;Community reintegration;Functional electrical stimulation   Therapy Documentation Precautions:  Precautions Precautions: Fall, Cervical, Other (comment) (HD MWF (RUE restricted).) Recall of Precautions/Restrictions: Impaired Precaution/Restrictions Comments: Verbally reviewed cervical precautions. Required Braces or Orthoses: Cervical Brace Cervical Brace: Hard collar Restrictions Weight Bearing Restrictions Per Provider Order: No   Therapy/Group: Individual Therapy  Gita Lamb Amadeo June, PT, DPT, CBIS  06/21/2023, 11:05 AM

## 2023-06-21 NOTE — Progress Notes (Signed)
 Patient has a bottle of medication labeled (sevelamer  carbonate) at bedside brought from home. Patient educated that this medication is already ordered. Patient educated on the risk of  that the provider is unaware of and that its against hospital policy to take medication from home unless ordered by a provider and verified by a pharmacist. Patient verbalized understanding and requested for medication to placed in his patient's belongings.

## 2023-06-21 NOTE — Progress Notes (Addendum)
 Physical Therapy Session Note  Patient Details  Name: Author Slaven Sr. MRN: [id number] Date of Birth: Sep 10, 1960  Today's Date: 06/21/2023 PT Individual Time: 0901-1000 PT Individual Time Calculation (min): 59 min   Short Term Goals: Week 3:  PT Short Term Goal 1 (Week 3): Pt will perform bed mobility with min assist PT Short Term Goal 2 (Week 3): Pt will perform sit <> stands with mod A PT Short Term Goal 3 (Week 3): Pt will be able to gait x 150' with mod assist PT Short Term Goal 4 (Week 3): Pt will be able to independently perform and verbalize pressure relief techniques with PWC  Skilled Therapeutic Interventions/Progress Updates: Pt presents sitting in PWC in room and agreeable to therapy.  Pt negotiates PWC throughout room and into hallways, Independent and to storage room to retrieve LE side pad, but unavailable.  Pt positions self in main gym and seat height raised to 21".  Pt manages foot rest but requires cues for hand placement for transfers.  Pt required max A and cues for forward lean.  Pt initiates but requires max A to complete extension of knees.  Pt encouraged to attain balance before reaching to strap R hand in aide.  Pt amb x 45' w/ RW and min A including turn to return to w/c.  Pt advancing LES well w/ reciprocal gait pattern, RLE slightly ER, but corrects w/ cueing.  Pt amb x 92' w/ same although noted fatigue at conclusion w/ decreased placement RLE.  Pt performed forward and then backward walking w/ min A although 1 LOB stepping backwards requiring mod/max A to regain.  Pt returned to room and remained sitting w/ seat belt on, all needs in reach.     Therapy Documentation Precautions:  Precautions Precautions: Fall, Cervical, Other (comment) (HD MWF (RUE restricted).) Recall of Precautions/Restrictions: Impaired Precaution/Restrictions Comments: Verbally reviewed cervical precautions. Required Braces or Orthoses: Cervical Brace Cervical Brace: Hard  collar Restrictions Weight Bearing Restrictions Per Provider Order: No General:   Vital Signs:   Pain:0/10     Therapy/Group: Individual Therapy  Jarious Lyon P Juliah Scadden 06/21/2023, 10:02 AM

## 2023-06-21 NOTE — Progress Notes (Signed)
 Occupational Therapy Session Note  Patient Details  Name: Carl Bauermeister Sr. MRN: [id number] Date of Birth: 1960/08/04  Today's Date: 06/21/2023 OT Individual Time: 8657-8469 OT Individual Time Calculation (min): 70 min    Short Term Goals: Week 2:  OT Short Term Goal 1 (Week 2): Pt will complete bed mobility at Mod A in preparation for LB dressing at bed level OT Short Term Goal 1 - Progress (Week 2): Met OT Short Term Goal 2 (Week 2): Pt will complete self feeding at Min A with LUE with AE as necessary OT Short Term Goal 2 - Progress (Week 2): Met OT Short Term Goal 3 (Week 2): Pt will complete UB dressing at Mod A OT Short Term Goal 3 - Progress (Week 2): Met Week 3:  OT Short Term Goal 1 (Week 3): Pt will complete LB dressing with mod A STS from w/c or EOB OT Short Term Goal 2 (Week 3): Pt will complete toileting tasks with max A OT Short Term Goal 3 (Week 3): Pt will complete bathing tasks with mod A   Skilled Therapeutic Interventions/Progress Updates:    Pt bed level at time of session with no pain, just finished eating breakfast and wanting to wash up. Using basin and sitting at EOB for UB bathing/face with SBA. UB dress with MIN A to fully pull down in the back. LB dressing first sit <> stand in RW with MOD A to doff old pants and brief, total A for clothing. Donning new brief and pants initially seated with pt attempting to thread, extended time. Sit <> stand in Stedy from elevated bed height MIN A and 2+ for safety, donning pants and brief over hips dependent while pt in standing in stedy. Stedy > power chair and donning tennis shoes, pt able to tie one shoe with extended time. Driving chair > sink for oral hygiene. Note pt directing care throughout session and has his routine. Set up in power chair with all needs met, call bell in reach, reclined in chair for pressure relief.   Therapy Documentation Precautions:  Precautions Precautions: Fall, Cervical, Other (comment) (HD MWF  (RUE restricted).) Recall of Precautions/Restrictions: Impaired Precaution/Restrictions Comments: Verbally reviewed cervical precautions. Required Braces or Orthoses: Cervical Brace Cervical Brace: Hard collar Restrictions Weight Bearing Restrictions Per Provider Order: No    Therapy/Group: Individual Therapy  [name] 06/21/2023, 8:19 AM

## 2023-06-21 NOTE — Progress Notes (Signed)
 Physical Therapy Session Note  Patient Details  Name: Carl Veltri Sr. MRN: [id number] Date of Birth: 09/04/1960  Today's Date: 06/21/2023 PT Individual Time: 1030-1130 PT Individual Time Calculation (min): 60 min   Short Term Goals: Week 3:  PT Short Term Goal 1 (Week 3): Pt will perform bed mobility with min assist PT Short Term Goal 1 - Progress (Week 3): Met PT Short Term Goal 2 (Week 3): Pt will perform sit <> stands with mod A PT Short Term Goal 2 - Progress (Week 3): Progressing toward goal PT Short Term Goal 3 (Week 3): Pt will be able to gait x 150' with mod assist PT Short Term Goal 3 - Progress (Week 3): Progressing toward goal PT Short Term Goal 4 (Week 3): Pt will be able to independently perform and verbalize pressure relief techniques with PWC PT Short Term Goal 4 - Progress (Week 3): Met  Skilled Therapeutic Interventions/Progress Updates:  Today's session focused on trunk control, BUE overhead/reaching mobility, and gait training w/FWW. During session pt completed slide board transfer with min A from chair to bed, multiple STS transfers with Max A.x1, stand pivot transfer from bed to chair w/FWW and min A. Once standing, as well as ambulation approx 21ft between two tables using FWW and min A for BLE stability and form cuing. Pt was assisted with cues for hand placement, weight shifts,  facilitation of glutes during standing/gait activities, and activity modification to improve success rate during reaching/overhead activities. Pt also demonstrated high quality trunk control in all planes of movement In both a seated and supine position, pt was able to scoot independently indicating improved BUE pushing strength. Pt continues to be limited to approx. 90 degrees of B shoulder flexion with ~20 degrees from full elbow extension, limiting reaching ability when using BUE. During x10 single arm dunking into hoop w/ a beach ball. Pt managed to reach with functional L elbow extension and  full forward trunk lean independently, pt continued to have functional reaching limitation on RUE w/ this exercise. Time was also spent introducing pt to [facility] harness/equipment to continue gait/transfer training w/FWW PRN, I recommend allotting time for further education in future session.  Therapy Documentation Precautions:  Precautions Precautions: Fall, Cervical, Other (comment) (HD MWF (RUE restricted).) Recall of Precautions/Restrictions: Impaired Precaution/Restrictions Comments: Verbally reviewed cervical precautions. Required Braces or Orthoses: Cervical Brace Cervical Brace: Hard collar Restrictions Weight Bearing Restrictions Per Provider Order: No  Vital Signs: not taken during session   Pain: no complaints of pain during today's session      Therapy/Group: Individual Therapy  Sharla Davis, PT, DPT, CBIS  06/21/2023, 11:45 AM

## 2023-06-21 NOTE — Progress Notes (Signed)
 [facility] Progress Note   Subjective:    Seen and examined patient at bedside. Sitting up in the chair feeling fine. Tolerated yesterday's HD with net UF 2.5L. Next HD 5/9.  Objective Vitals:   06/20/23 2149 06/20/23 2151 06/21/23 0546 06/21/23 0600  BP: 95/63 98/60 114/73   Pulse: 78 77 76   Resp: 18  17   Temp: 97.9 F (36.6 C)  97.9 F (36.6 C)   TempSrc: Oral  Oral   SpO2: 100%  100%   Weight:    94.7 kg  Height:       Physical Exam General: Alert male in NAD; on RA Heart: RRR, no murmurs, rubs or gallops Lungs: CTA bilaterally, respirations unlabored Abdomen: Soft, non-distended, +BS Extremities: trace edema, wearing compression stockings Dialysis Access:  AVF + t/b  Filed Weights   06/19/23 1852 06/20/23 0500 06/21/23 0600  Weight: 94.9 kg 91.8 kg 94.7 kg    Intake/Output Summary (Last 24 hours) at 06/21/2023 1020 Last data filed at 06/21/2023 0800 Gross per 24 hour  Intake 1800 ml  Output --  Net 1800 ml    Additional Objective Labs: Basic Metabolic Panel: Recent Labs  Lab 06/18/23 0516 06/19/23 1505 06/20/23 1037  NA 133* 132* 132*  K 4.7 5.5* 4.5  CL 94* 95* 93*  CO2 26 22 24   GLUCOSE 85 135* 110*  BUN 56* 98* 61*  CREATININE 6.55* 9.20* 6.13*  CALCIUM  8.9 8.8* 8.5*  PHOS 3.9 5.5* 3.8   Liver Function Tests: Recent Labs  Lab 06/18/23 0516 06/19/23 1505 06/20/23 1037  ALBUMIN  2.0* 2.2* 2.2*   No results for input(s): "LIPASE", "AMYLASE" in the last 168 hours. CBC: Recent Labs  Lab 06/14/23 1450 06/18/23 0516 06/19/23 1505 06/20/23 1037  WBC 8.0 7.8 7.1 4.9  NEUTROABS  --  5.0 5.5  --   HGB 7.5* 7.3* 7.1* 7.6*  HCT 23.6* 22.7* 22.2* 24.4*  MCV 90.1 89.4 91.4 90.7  PLT 436* 396 371 345   Blood Culture    Component Value Date/Time   SDES FLUID 06/18/2023 1357   SPECREQUEST NONE 06/18/2023 1357   CULT  06/18/2023 1357    NO GROWTH 2 DAYS Performed at [facility], [address], [place], [place]  [address]    REPTSTATUS PENDING 06/18/2023 1357    Cardiac Enzymes: No results for input(s): "CKTOTAL", "CKMB", "CKMBINDEX", "TROPONINI" in the last 168 hours. CBG: No results for input(s): "GLUCAP" in the last 168 hours. Iron Studies: No results for input(s): "IRON", "TIBC", "TRANSFERRIN", "FERRITIN" in the last 72 hours. Lab Results  Component Value Date   INR 1.1 05/27/2023   INR 1.1 05/25/2023   INR 1.1 09/17/2021   Studies/Results: No results found.  Medications:   (feeding supplement) PROSource Plus  30 mL Oral BID BM   atorvastatin   10 mg Oral Q Thu   carbamide peroxide  5 drop Right EAR BID   Chlorhexidine  Gluconate Cloth  6 each Topical Q0600   cinacalcet   120 mg Oral Q supper   darbepoetin (ARANESP ) injection - DIALYSIS  150 mcg Subcutaneous Q Tue-1800   gabapentin   300 mg Oral QHS   Gerhardt's butt cream   Topical BID   pantoprazole   40 mg Oral BID AC   psyllium  1 packet Oral TID   senna-docusate  2 tablet Oral BID   sevelamer  carbonate  3,200 mg Oral TID WC   sodium zirconium cyclosilicate   10 g Oral QODAY   sorbitol   30 mL Oral Once   topiramate   25 mg Oral BID    Dialysis Orders: OP Dialysis Orders [facility] - MWF 4h  B400   85.5kg   2K bath  AVF   - Heparin  3000 units IV three times per week  - Mircera 60mcg IV q 2 weeks- last dose 05/24/23  - Calcitriol  1.5mcg PO q HD  Assessment/Plan: 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 Now in CIR ESRD: Now back on MWF schedule. Next HD today Hyperkalemia: K+ levels often in the 5s lately. Daily Lokelma  ordered in outpatient. Will start Lokelma  on non-HD days while here.  HTN/volume: BP controlled/stable, still above EDW with some edema on exam, push UFG as tolerated Anemia of ESRD: Hgb 7s, aranesp  dose recently increased and given 5/6, continue to titrate up if needed. 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 It was noted in our outpatient records of no blood products except in  emergent situations. Secondary HPTH: CorrCa >10, calcitriol  on hold for now/Phos now at goal, continue sevelamer  and sensipar  Nutrition: Alb 2.0, continue protein supplements  Adrian Alba MD [facility] Pager [phone]

## 2023-06-21 NOTE — Progress Notes (Signed)
 Occupational Therapy Session Note  Patient Details  Name: Carl Rapkin Sr. MRN: [id number] Date of Birth: [birth year]  {CHL IP REHAB OT TIME CALCULATIONS:[id number]}  Short Term Goals: Week 3:  OT Short Term Goal 1 (Week 3): Pt will complete LB dressing with mod A STS from w/c or EOB OT Short Term Goal 2 (Week 3): Pt will complete toileting tasks with max A OT Short Term Goal 3 (Week 3): Pt will complete bathing tasks with mod A  Skilled Therapeutic Interventions/Progress Updates:    Therapy Documentation Precautions:  Precautions Precautions: Fall, Cervical, Other (comment) (HD MWF (RUE restricted).) Recall of Precautions/Restrictions: Impaired Precaution/Restrictions Comments: Verbally reviewed cervical precautions. Required Braces or Orthoses: Cervical Brace Cervical Brace: Hard collar Restrictions Weight Bearing Restrictions Per Provider Order: No   Therapy/Group: Individual Therapy  Artemus Biles, OTR/L, MSOT  06/21/2023, 11:37 PM

## 2023-06-21 NOTE — Plan of Care (Signed)
  Problem: Consults Goal: RH SPINAL CORD INJURY PATIENT EDUCATION Description:  See Patient Education module for education specifics.  Outcome: Progressing   Problem: SCI BOWEL ELIMINATION Goal: RH STG MANAGE BOWEL WITH ASSISTANCE Description: STG Manage Bowel with minimal Assistance. Outcome: Progressing Goal: RH STG SCI MANAGE BOWEL WITH MEDICATION WITH ASSISTANCE Description: STG SCI Manage bowel with medication with assistance. Outcome: Progressing   Problem: RH SKIN INTEGRITY Goal: RH STG SKIN FREE OF INFECTION/BREAKDOWN Description: Manage skin free of infection/breakdown with minimal assistance Outcome: Progressing   Problem: RH SAFETY Goal: RH STG ADHERE TO SAFETY PRECAUTIONS W/ASSISTANCE/DEVICE Description: STG Adhere to Safety Precautions With minimal Assistance/Device. Outcome: Progressing   Problem: RH PAIN MANAGEMENT Goal: RH STG PAIN MANAGED AT OR BELOW PT'S PAIN GOAL Description: Pain<4 w/ prns Outcome: Progressing

## 2023-06-21 NOTE — Progress Notes (Signed)
   06/21/23 1800  Vitals  Temp 97.9 F (36.6 C)  Pulse Rate (!) 56  Resp 16  BP 110/74  SpO2 100 %  O2 Device Room Air  Weight 89.5 kg  Type of Weight Post-Dialysis  Post Treatment  Dialyzer Clearance Heavily streaked  Hemodialysis Intake (mL) 0 mL  Liters Processed 84  Fluid Removed (mL) 3500 mL  Tolerated HD Treatment Yes  AVG/AVF Arterial Site Held (minutes) 10 minutes  AVG/AVF Venous Site Held (minutes) 10 minutes   Received patient in bed to unit.  Alert and oriented.  Informed consent signed and in chart.   TX duration:3.5hrs  Patient tolerated well.  Transported back to the room  Alert, without acute distress.  Hand-off given to patient's nurse.   Access used: RAVF Access issues: none  Total UF removed: 3.5L Medication(s) given: none    [name]

## 2023-06-21 NOTE — Progress Notes (Signed)
 [facility] Progress Note   Subjective:    Patient seen in hallway just finished working with PT. No issues. Plan for HD this afternoon.  Objective Vitals:   06/20/23 2149 06/20/23 2151 06/21/23 0546 06/21/23 0600  BP: 95/63 98/60 114/73   Pulse: 78 77 76   Resp: 18  17   Temp: 97.9 F (36.6 C)  97.9 F (36.6 C)   TempSrc: Oral  Oral   SpO2: 100%  100%   Weight:    94.7 kg  Height:       Physical Exam General: Alert male in NAD; on RA Heart: RRR, no murmurs, rubs or gallops Lungs: CTA bilaterally, respirations unlabored Abdomen: Soft, non-distended, +BS Extremities: trace edema, wearing compression stockings Dialysis Access:  AVF + t/b  Filed Weights   06/19/23 1852 06/20/23 0500 06/21/23 0600  Weight: 94.9 kg 91.8 kg 94.7 kg    Intake/Output Summary (Last 24 hours) at 06/21/2023 1008 Last data filed at 06/21/2023 0800 Gross per 24 hour  Intake 1800 ml  Output --  Net 1800 ml    Additional Objective Labs: Basic Metabolic Panel: Recent Labs  Lab 06/18/23 0516 06/19/23 1505 06/20/23 1037  NA 133* 132* 132*  K 4.7 5.5* 4.5  CL 94* 95* 93*  CO2 26 22 24   GLUCOSE 85 135* 110*  BUN 56* 98* 61*  CREATININE 6.55* 9.20* 6.13*  CALCIUM  8.9 8.8* 8.5*  PHOS 3.9 5.5* 3.8   Liver Function Tests: Recent Labs  Lab 06/18/23 0516 06/19/23 1505 06/20/23 1037  ALBUMIN  2.0* 2.2* 2.2*   No results for input(s): "LIPASE", "AMYLASE" in the last 168 hours. CBC: Recent Labs  Lab 06/14/23 1450 06/18/23 0516 06/19/23 1505 06/20/23 1037  WBC 8.0 7.8 7.1 4.9  NEUTROABS  --  5.0 5.5  --   HGB 7.5* 7.3* 7.1* 7.6*  HCT 23.6* 22.7* 22.2* 24.4*  MCV 90.1 89.4 91.4 90.7  PLT 436* 396 371 345   Blood Culture    Component Value Date/Time   SDES FLUID 06/18/2023 1357   SPECREQUEST NONE 06/18/2023 1357   CULT  06/18/2023 1357    NO GROWTH 2 DAYS Performed at [facility], [address], [place], [place] [address]    REPTSTATUS PENDING 06/18/2023  1357    Cardiac Enzymes: No results for input(s): "CKTOTAL", "CKMB", "CKMBINDEX", "TROPONINI" in the last 168 hours. CBG: No results for input(s): "GLUCAP" in the last 168 hours. Iron Studies: No results for input(s): "IRON", "TIBC", "TRANSFERRIN", "FERRITIN" in the last 72 hours. Lab Results  Component Value Date   INR 1.1 05/27/2023   INR 1.1 05/25/2023   INR 1.1 09/17/2021   Studies/Results: No results found.  Medications:   (feeding supplement) PROSource Plus  30 mL Oral BID BM   atorvastatin   10 mg Oral Q Thu   carbamide peroxide  5 drop Right EAR BID   Chlorhexidine  Gluconate Cloth  6 each Topical Q0600   cinacalcet   120 mg Oral Q supper   darbepoetin (ARANESP ) injection - DIALYSIS  150 mcg Subcutaneous Q Tue-1800   gabapentin   300 mg Oral QHS   Gerhardt's butt cream   Topical BID   pantoprazole   40 mg Oral BID AC   psyllium  1 packet Oral TID   senna-docusate  2 tablet Oral BID   sevelamer  carbonate  3,200 mg Oral TID WC   sodium zirconium cyclosilicate   10 g Oral QODAY   sorbitol   30 mL Oral Once   topiramate   25 mg Oral BID    Dialysis Orders: OP Dialysis Orders [facility] - MWF 4h  B400   85.5kg   2K bath  AVF   - Heparin  3000 units IV three times per week  - Mircera 60mcg IV q 2 weeks- last dose 05/24/23  - Calcitriol  1.5mcg PO q HD  Assessment/Plan: 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 Now in CIR ESRD: Now back on MWF schedule. Next HD 5/9. Hyperkalemia: K+ levels in the 5s lately. Noted snacks at bedside. Noted daily Lokelma  ordered in outpatient. On Lokelma  on non-HD days while here. Monitor trend HTN/volume: BP controlled/stable, still above EDW with some edema on exam, push UFG as tolerated Anemia of ESRD: Hgb now 7.6, aranesp  dose recently increased and given 5/6, continue to titrate up if needed. 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 It was noted in our outpatient records of no blood products except in emergent  situations. Secondary HPTH: CorrCa >10, calcitriol  on hold for now/Phos now at goal, continue sevelamer  and sensipar  Nutrition: Alb 2.0, continue protein supplements  [name], NP [facility] 06/21/2023,10:08 AM  LOS: 22 days

## 2023-06-21 NOTE — Progress Notes (Signed)
 PROGRESS NOTE   Subjective/Complaints:  Pt reports feeling good- doesn't feel dizzy, fatigued that goes with his low Hb, usually.   Pt's Hb up to 7.6 today.  Had multiple BM's yesterday and overnight due to Sorbitol -   Ready for  HD and therapy today  ROS:    Pt denies SOB, abd pain, CP, N/V/C/D, and vision changes   B/L R>L knee pain (+)- R knee very improved  Objective:   No results found.     Recent Labs    06/19/23 1505 06/20/23 1037  WBC 7.1 4.9  HGB 7.1* 7.6*  HCT 22.2* 24.4*  PLT 371 345    Recent Labs    06/19/23 1505 06/20/23 1037  NA 132* 132*  K 5.5* 4.5  CL 95* 93*  CO2 22 24  GLUCOSE 135* 110*  BUN 98* 61*  CREATININE 9.20* 6.13*  CALCIUM  8.8* 8.5*     Intake/Output Summary (Last 24 hours) at 06/21/2023 0802 Last data filed at 06/20/2023 2149 Gross per 24 hour  Intake 1800 ml  Output --  Net 1800 ml        Physical Exam: Vital Signs Blood pressure 114/73, pulse 76, temperature 97.9 F (36.6 C), temperature source Oral, resp. rate 17, height 6\' 1"  (1.854 m), weight 94.7 kg, SpO2 100%.       General: awake, alert, appropriate, sitting EOB feeding self breakfast; NAD HENT: conjugate gaze; oropharynx moist- soft collar in place CV: regular rate and rhythm; no JVD Pulmonary: CTA B/L; no W/R/R- good air movement GI: soft, NT, ND, (+)BS- slightly hyperactive since eating Psychiatric: appropriate- bright affect Neurological: Ox3  MSK- R knee much less swollen PRIOR EXAMS: MsK: B/L knees with effusions- less TTP on R knee this AM , no erythema, not really focally warm to touch- Both have effusions, - R hand swelling looks good/resolved  MSK- R hand swelling esp ulnar side of hand/dorsum and L wrist-stable this AM RUE- Biceps 4/5; WE 4-/5; Triceps 2/5; Grip 2-/5 and FA 1/5 LUE- 4/5 except triceps 3+ to 4-/5 RLE- HF 1 to 2-/5; KE 2-/5; DF/PF 2-/5 LLE- HF 3/5; KE 4-/5; DF/PF  4/5    Skin: C/D/I. Multiple lesions on BL legs with various stages of healing.  + RUE fistula - intact   MSK:      LLE knee with palpable effusion, no warmth, no ttp Mood: Pleasant affect, appropriate mood.  Neurologic exam: Alert and oriented x 4, fluent, follows commands, cranial nerves II through XII grossly intact, no hypertonia noted  Sensation: Reduced to light touch in distal right lower extremity to the mid-thigh       Strength:                RUE: 2/5 SA, 2/5 EF, 2/5 EE, 2/5 WE, 2/5 FF, 2/5 FA                LUE:  3/5 SA, 3/5 EF, 3/5 EE, 3/5 WE, 3/5 FF, 3/5 FA                RLE: 2/5 HF, 2/5 KE, 2/5  DF, 2/5  EHL, 2/5  PF  LLE:  3/5 HF, 4/5 KE, 4/5  DF, 4/5  EHL, 4/5  PF  Neuroexam stable 4/21  Assessment/Plan: 1. Functional deficits which require 3+ hours per day of interdisciplinary therapy in a comprehensive inpatient rehab setting. Physiatrist is providing close team supervision and 24 hour management of active medical problems listed below. Physiatrist and rehab team continue to assess barriers to discharge/monitor patient progress toward functional and medical goals  Care Tool:  Bathing    Body parts bathed by patient: Right arm, Chest, Abdomen, Right upper leg, Left upper leg, Face   Body parts bathed by helper: Left arm, Front perineal area, Buttocks, Right lower leg, Left lower leg     Bathing assist Assist Level: Moderate Assistance - Patient 50 - 74%     Upper Body Dressing/Undressing Upper body dressing   What is the patient wearing?: Pull over shirt    Upper body assist Assist Level: Minimal Assistance - Patient > 75%    Lower Body Dressing/Undressing Lower body dressing      What is the patient wearing?: Pants     Lower body assist Assist for lower body dressing: Moderate Assistance - Patient 50 - 74%     Toileting Toileting Toileting Activity did not occur Press photographer and hygiene only): N/A (no void or bm) (anuric,  HD patient)  Toileting assist Assist for toileting: Maximal Assistance - Patient 25 - 49%     Transfers Chair/bed transfer  Transfers assist     Chair/bed transfer assist level: Contact Guard/Touching assist (SB)     Locomotion Ambulation   Ambulation assist   Ambulation activity did not occur: Safety/medical concerns  Assist level: 2 helpers Assistive device: Walker-rolling Max distance: 65'   Walk 10 feet activity   Assist  Walk 10 feet activity did not occur: Safety/medical concerns  Assist level: Moderate Assistance - Patient - 50 - 74% Assistive device: Walker-rolling, Orthosis   Walk 50 feet activity   Assist Walk 50 feet with 2 turns activity did not occur: Safety/medical concerns  Assist level: 2 helpers Assistive device: Walker-rolling, Orthosis    Walk 150 feet activity   Assist Walk 150 feet activity did not occur: Safety/medical concerns  Assist level: 2 helpers      Walk 10 feet on uneven surface  activity   Assist Walk 10 feet on uneven surfaces activity did not occur: Safety/medical concerns         Wheelchair     Assist Is the patient using a wheelchair?: Yes Type of Wheelchair: Power    Wheelchair assist level: Independent Max wheelchair distance: 150    Wheelchair 50 feet with 2 turns activity    Assist        Assist Level: Independent   Wheelchair 150 feet activity     Assist      Assist Level: Independent   Blood pressure 114/73, pulse 76, temperature 97.9 F (36.6 C), temperature source Oral, resp. rate 17, height 6\' 1"  (1.854 m), weight 94.7 kg, SpO2 100%.  Medical Problem List and Plan: 1. 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  2.  Antithrombotics: -DVT/anticoagulation:  Mechanical: Sequential compression devices, below knee Bilateral lower extremities             -antiplatelet therapy: N/A--no antiplatelet due to hx of recurrent GIB  4/25- d/w family- they agree that Lovenox  not appropriate at this time.   -06/09/23 ordered dopplers to r/o clots given low grade temps recently  4/28- LE Dopplers (-) for DVT 3. Pain Management:  oxycodone  prn. Gabapentin  300 mg/hs.              --has Flexeril  10 mg TID prn              - patient with intermittent, severe b/l headache - add topomax 25 mg BID    4/19- pt rating pain 07/19/08 which is "tolerable"  4/22- pain slightly better this AM  4/25- fewer complaints of pain -06/09/23 having more b/l knee pain and using percocets more-- could be causing confusion? Advised judicious use, might benefit from knee aspirations/injections? 4/28- less knee pain this AM 4/30- pain about the same- just started Prednisone  yesterday per Ortho Steffanie Edouard request 5/1- fewer complaints about knee pain 5/2- new pain is doing "well" on prednisone , hopefully does better once prednisone  is stopped after 3 days 5/6- Pt's knees- R knee especially started hurting again yesterday after Steroid dosing- can take Percocet as needed for knees 5/7- got Ortho to come and take fluid off R knee- got 100cc off- pain is no more than 1/10 this AM! 5/8-5/9knee still doing well 4. Mood/Behavior/Sleep: LCSW to follow for evaluation and support             -antipsychotic agents: N/A             --melatonin prn for insomnia.  -06/09/23 poor sleep, d/t interruptions, asked nursing to minimize  interruptions between 11p-5a (apparently they can't put a sign up, but will communicate in huddle) 5. Neuropsych/cognition: This patient is capable of making decisions on his own behalf. 5/8- Ox3 and appropriate 6. Skin/Wound Care: Routine pressure relief measures.   4/19- due to ulcers on legs, on low air loss mattress 7. Fluids/Electrolytes/Nutrition: Strict I/O. Daily weights. Labs with HD. 8. ESRD- HD MWF: Schedule HD at the end of the day to help with tolerance of therapy             --Sensipar  and Renvela  for metabolic bone disease.   - Patient reports no longer makes urine  4/20- pt getting HD this AM- last HD was Wednesday.    9. Anemia of chronic disease: Monitor H/H, aranesp   -4/21 HGB lower 7.5, FOBT, recheck tomorrow  4/22- HB 8.7 on today's check-con't to monitor  -06/09/23 hgb 8.5 yesterday  4/28- Hb down to 7.7- 4/29- will try to recheck Thursday  4/30- if they don't check in HD, will check tomorrow 5/1- Hb down to 7.3- is Jehovah's Witness, so doesn't want transfusions? 5/6- Hb still 7.3- isn't dropping more- No blood or dark stools per pt- no signs of GI loss. 5/7- pt refused labs this AM- wants to get in HD  5/8- Hb 7.1 yesterday after HD- did explained to pt like to get  labs before HD so can act on them- will d/w pt again about transfusion? 5/9- Hb 7.6 in last 24 hours- done yesterday- feels better- no dizziness or fatigue- so doesn't feel transfusion is appropriate 10. H/o recurrent GIB/Jehovah's Witness: Continue Protonix  BID 11. H/o Hypotension: Has had drop  in BP with HD. Now cervical SCI.  --Monitor for symptoms day after HD and with activity. -4/18 baseline blood pressure stable, monitor with activity 4/19- BP actually slightly elevated today- 140s-150's- con't regimen, since can drop with HD and therapy 4/20- BP actually doing better today- in 100s'110s- however HR elevated, likely to fluid overload 4/21 nephrology started midodrine  10mg  before dialysis, BP  appears to be doing a little better today 4/22- less hypotensive this AM laying- will monitor for [facility] 4/23- Tachycardia slightly better- in high 90's- having some intermittent elevated BP's- will monitor 4/24- BP running 140-s150s, but don't want to drop it since having HD 4/25- Having Hypotension with therapy, so don't want to reduce BP at all- doing TED's and ACE wraps- occ elevated, but with HD and therapy, not appropriate to reduce levels 4/29-5/4-  BP controlled- softer on dialysis days but stable; con't regimen 5/5- 5/7BP controlled- con't regimen 5/8- BP on low side in last 24 hours- likely made worse due to HD yesterday- denies OH Sx's. Con't to monitor trend 5/9- BP is running in 90's systolic- doesn't feel dizzy or lightheaded sitting, but could drop when standing-will d/w therapy Vitals:   06/19/23 1720 06/19/23 1750 06/19/23 1820 06/19/23 1835  BP: 101/81 (!) 114/94 96/65 (!) 91/56   06/19/23 1850 06/19/23 1852 06/19/23 1928 06/20/23 0245  BP: (!) 98/58 99/62 108/67 102/68   06/20/23 1257 06/20/23 2149 06/20/23 2151 06/21/23 0546  BP: 115/69 95/63 98/60  114/73     12. Constipation: Small BM on 04/14 --had of nulytey yesterday and another today? -Xray 4/17 without excessive stool, patient reports stool still little hard yesterday.  Will increase MiraLAX  to 3 times a day, continue Senokot twice a day. Suppository PRN   06/09/23 LBM yesterday, cont regimen 4/28- LBM overnight- has been refusing miralax - made it prn- taking metamucil  4/30- asking for Sorbitol  today since needs ot have bigger BM  5/6- LBM yesterday  5/7- feels stools, although usually small, are enough- doesn't want to have accident, so not willing to take Sorbitol   5/8- wants sorbitol  today after therapy- ordered  5/9- had multiple BMs 13. ?left knee effusion. Not currently bothersome. Consider imaging.  14.  SIRS- Fever/hypotension/tachycardia - 4/21 Currently afebrile, he got to 50 cc IVF yesterday, CXR  negative, EKG sinus tachycardia. Patient was started on Maxipime  and vancomycin  yesterday.  Appears to be doing much better today.  WBC not elevated at 7.1.  Continue to follow blood cultures 4/22- Blood Cx's (-) so far- WBC 7.3- will con't IV ABX until know final results of blood Cx's- CXR (-)- but temp was 103.2 yesterday overnight.  4/23- Blood C'x (-) x2 days so far- T 99.9 this Am after a few sips of coffee- was 100.4 this AM- will con't IV ABX since concerned about possible bacteremia- esp since having low grade temps still 4/24- No more fevers, even low grade- no growth on blood Cx' bu it says that didn't get enough blood, so much be falsely negative 4/25- No growth for 5 days, but it also said not enough blood to be sure was a true negative. Will change stop date til tomorrow -06/08/23 at 3am temp recorded as 100.3 but  5am was 98.3; will get CBC w/diff, monitor temps closely today, if continued low grade temps, will reach out to ID. BCx NGTD x5d. Didn't get vanc yesterday? Or cefepime ? Unclear why. Cefepime  started this morning at 0011. -06/09/23 temp 100.3 at 3am yesterday and then 100 later in the day, but no fevers since, no tachycardia.  -WBC yesterday 8.9.  -Now with some confusion; spoke with Dr. Levern Reader of ID, stated cefepime  could be the cause of the confusion -recommended we stop all abx (which ended yesterday anyway) -watch the patient OFF abx -if fevers >100.3 occur then she recommended repeat BCx and CXR and could call her back to see if any abx would be advised at that time.  -Head CT and dopplers ordered as well, as above 4/28- CT looks good- no fever, WBC is OK- off ABX 4/29- Also Dopplers (-) feeling good this AM 06/16/23 seems to have resolved. 15. Tachycardia 4/22- doing better- right at 100 this AM- will monitor- likely can be from dregs of infection vs OH. -will monitor  4/23- Tachycardia improving- 4/24-27 HR in 90's right now- doing better  4/29- 4/30- HR running in  90s-low 100's today 16. R hand swelling  4/23- doing ice- will do xrays 4/24- Xray doesn't look like cause of swelling, however could be due to new shunt? 4/30- resolved 17. Hypocalcemia 4/24- his Albumin  is 2.1, so probably ok, and phos coming down- will leave to renal- thank you for their help  5/1- Ca 9.3 5/8- Ca 8.8 18. Hyperkalemia 4/28- K+ 6.3 today- got hypoosmotic bath to reduce K+ per note from RN/HD nurse? Also has Na of 127- being addressed by HD/renal- Phos also 5.8- but again, getting HD today 5/1- K+ down to 5.6 and Na 129 5/6- Na 133 and K+ down to 4.7 from 5.6 5/7- refused labs this AM- wants in HD 5/9- K_ came back as 4.5 19. R>L knee pain  4/29- will ask Ortho if they will tap his R knee- I have no problem doing injection of knee, however with so much fluid, I would rather Ortho tap him and then do steroid injection.   4/30- we added Prednisone  40 mg daily x 3 days- to see if helps knee pain  5/1- mild reduction in pain so far  5/6- Pt has short term improvement from Prednisone  dosing for knees- con't percocet prn  5/7- since pt was doing so much worse with therapy, asked Ortho to come and tap knee and take off fluid- do steroid injection- they drew off 100CC- 5 different draws required- fluid is pending, but has a lot of WBCs- no organisms on gram stain.   5/8- no growth for 24 hours- R knee pain so much better    LOS: 22 days A FACE TO FACE EVALUATION WAS PERFORMED  [name] 06/21/2023, 8:02 AM

## 2023-06-22 DIAGNOSIS — I9589 Other hypotension: Secondary | ICD-10-CM

## 2023-06-22 LAB — BODY FLUID CULTURE W GRAM STAIN: Culture: NO GROWTH

## 2023-06-22 NOTE — Plan of Care (Signed)
  Problem: Consults Goal: RH SPINAL CORD INJURY PATIENT EDUCATION Description:  See Patient Education module for education specifics.  Outcome: Progressing   Problem: SCI BOWEL ELIMINATION Goal: RH STG MANAGE BOWEL WITH ASSISTANCE Description: STG Manage Bowel with minimal Assistance. Outcome: Progressing   Problem: SCI BOWEL ELIMINATION Goal: RH STG SCI MANAGE BOWEL WITH MEDICATION WITH ASSISTANCE Description: STG SCI Manage bowel with medication with assistance. Outcome: Progressing   Problem: RH SKIN INTEGRITY Goal: RH STG SKIN FREE OF INFECTION/BREAKDOWN Description: Manage skin free of infection/breakdown with minimal assistance Outcome: Progressing   Problem: RH SAFETY Goal: RH STG ADHERE TO SAFETY PRECAUTIONS W/ASSISTANCE/DEVICE Description: STG Adhere to Safety Precautions With minimal Assistance/Device. Outcome: Progressing

## 2023-06-22 NOTE — Progress Notes (Signed)
 [facility] Progress Note   Subjective:    Completed dialysis last night. No new issues.  Seen in room. Doing ok with therapy. Not making as much progress as he would like.   Objective Vitals:   06/21/23 1750 06/21/23 1800 06/21/23 1955 06/22/23 0540  BP: 113/77 110/74 95/68 111/76  Pulse: 90 (!) 56 100 81  Resp: 11 16 18 16   Temp:  97.9 F (36.6 C) 98.6 F (37 C) 98 F (36.7 C)  TempSrc:      SpO2: 98% 100% 100% 100%  Weight:  89.5 kg  93 kg  Height:       Physical Exam General: Appears well, nad  Heart: RRR Lungs: Clear throughout  Abdomen: Soft, non-distended Extremities: trace edema, wearing compression stockings Dialysis Access:  RUE AVF +bruit   Filed Weights   06/21/23 1355 06/21/23 1800 06/22/23 0540  Weight: 93 kg 89.5 kg 93 kg    Intake/Output Summary (Last 24 hours) at 06/22/2023 1359 Last data filed at 06/22/2023 1323 Gross per 24 hour  Intake 840 ml  Output 3500 ml  Net -2660 ml    Additional Objective Labs: Basic Metabolic Panel: Recent Labs  Lab 06/19/23 1505 06/20/23 1037 06/21/23 1400  NA 132* 132* 131*  K 5.5* 4.5 5.3*  CL 95* 93* 91*  CO2 22 24 23   GLUCOSE 135* 110* 110*  BUN 98* 61* 82*  CREATININE 9.20* 6.13* 7.87*  CALCIUM  8.8* 8.5* 8.6*  PHOS 5.5* 3.8 4.5   Liver Function Tests: Recent Labs  Lab 06/19/23 1505 06/20/23 1037 06/21/23 1400  ALBUMIN  2.2* 2.2* 2.3*   No results for input(s): "LIPASE", "AMYLASE" in the last 168 hours. CBC: Recent Labs  Lab 06/18/23 0516 06/19/23 1505 06/20/23 1037 06/21/23 1400  WBC 7.8 7.1 4.9 7.2  NEUTROABS 5.0 5.5  --   --   HGB 7.3* 7.1* 7.6* 7.6*  HCT 22.7* 22.2* 24.4* 24.2*  MCV 89.4 91.4 90.7 89.6  PLT 396 371 345 419*   Blood Culture    Component Value Date/Time   SDES FLUID 06/18/2023 1357   SPECREQUEST NONE 06/18/2023 1357   CULT  06/18/2023 1357    NO GROWTH 3 DAYS Performed at [facility], [address], [place], [place] [address]     REPTSTATUS 06/22/2023 FINAL 06/18/2023 1357    Cardiac Enzymes: No results for input(s): "CKTOTAL", "CKMB", "CKMBINDEX", "TROPONINI" in the last 168 hours. CBG: No results for input(s): "GLUCAP" in the last 168 hours. Iron Studies: No results for input(s): "IRON", "TIBC", "TRANSFERRIN", "FERRITIN" in the last 72 hours. Lab Results  Component Value Date   INR 1.1 05/27/2023   INR 1.1 05/25/2023   INR 1.1 09/17/2021   Studies/Results: No results found.  Medications:   (feeding supplement) PROSource Plus  30 mL Oral BID BM   atorvastatin   10 mg Oral Q Thu   carbamide peroxide  5 drop Right EAR BID   Chlorhexidine  Gluconate Cloth  6 each Topical Q0600   cinacalcet   120 mg Oral Q supper   darbepoetin (ARANESP ) injection - DIALYSIS  150 mcg Subcutaneous Q Tue-1800   gabapentin   300 mg Oral QHS   Gerhardt's butt cream   Topical BID   pantoprazole   40 mg Oral BID AC   psyllium  1 packet Oral TID   senna-docusate  2 tablet Oral BID   sevelamer  carbonate  3,200 mg Oral TID WC   sodium zirconium cyclosilicate   10 g Oral QODAY   sorbitol   30 mL Oral Once   topiramate   25 mg Oral BID    Dialysis Orders: OP Dialysis Orders [facility] - MWF 4h  B400   85.5kg   2K bath  AVF   - Heparin  3000 units IV three times per week  - Mircera 60mcg IV q 2 weeks- last dose 05/24/23  - Calcitriol  1.5mcg PO q HD  Assessment/Plan: 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 Next HD 5/12.  Hyperkalemia: K+ 5s lately. Chronic issue.  On Lokelma  on non-HD days while here. 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 Secondary HPTH: CorrCa >10, calcitriol  on hold for now/Phos now at goal, continue sevelamer  and  sensipar  Nutrition: Alb 2.0, continue protein supplements  Elona Hal PA-C [facility] 06/22/2023,2:01 PM

## 2023-06-22 NOTE — Progress Notes (Signed)
 Physical Therapy Session Note  Patient Details  Name: Carl Arns Sr. MRN: [id number] Date of Birth: 1961-02-08  Today's Date: 06/22/2023 PT Individual Time: 0806-0902; 1320 - 1410 PT Individual Time Calculation (min): 56 min; 50 min   Short Term Goals: Week 4:  PT Short Term Goal 1 (Week 4): Pt will consistantly be able to perform sit <> stands with max A PT Short Term Goal 2 (Week 4): Pt will be able to perform gait x 100' with mod A PT Short Term Goal 3 (Week 4): Pt will be able to perform bed mobility with supervision  SESSION 1 Skilled Therapeutic Interventions/Progress Updates: Patient sitting in PWC on entrance to room. Patient alert and agreeable to PT session.   Patient with no complaints of pain during session.  Pt navigated inpatient floor with PWC and with supervision for safety.   Therapeutic Exercise: Pt performed the following exercises with therapist providing the described cuing and facilitation for improvement. Multiple rest breaks required throughout due to fatigue.  - Sit<>Stand from STEDY pad with emphasis on eccentric control + hip hinge - pause at mini squat + power up with cues to increase mind-muscle connection to B glutes. modA overall. 3 rounds with rest breaks required. Initially started with sheet under glutes to assist with eccentric control - pt progressed to not needing it, and able to control - in // bars with B UE support stepping to 2" step with B LE alternating. Pt modA to stand, and CGA to step with pt cued to weight shift and to maintain upright posture with glute activation (R glute decreased activation vs L with tactile cuing to adjust/maintain posture). Seated rest break required. Pt stood again and stepped with only L LE in order to focus on maintain quad activation R LE due to decreased strength presentation (signs of buckling on 1st round - PTA providing block)  Patient sitting in PWC at end of session with brakes locked, nsg present and all  needs within reach.  SESSION 2 Skilled Therapeutic Interventions/Progress Updates: Patient sitting in PWC on entrance to room. Patient alert and agreeable to PT session.   Patient reported no pain during session. Pt requested to eat lunch due to being unable to prior to therapy arrival (missed time charted).   Therapeutic Activity: Transfers: Pt performed sit<>stands in preparation for ambulation with modA and PWC elevated. Pt cued to increase anterior lean and to power through B LE's  Pt ambulated roughly 50' using RW with CGA on noncompliant surfaces outside of WCC. Pt with forward flexed posture with cues to engage glutes, and to look forward ahead vs at ground. Pt with decreased cadence and step length and slightly more than step-to pattern. Pt required standing rest breaks as needed. Pt performed in order to increase ambulatory endurance and B LE strength. Pt with seated rest break.  Pt standing at railing outside (rail in front) with B UE supported. Pt cued to maintain upright posture (rest breaks required throughout with B forearms on railing). Pt performed up to 10 min (CGA)  Pt cued to perform mini squats with CGA and B UE supported with cues to hinge at hips as much as able to safely. Pt performed close to fatigue and required seated rest.   Pt propelled PWC with supervision for safety throughout session (room<outside WCC<room).  Patient sitting in PWC at end of session with brakes locked, and all needs within reach.       Therapy Documentation Precautions:  Precautions Precautions: Fall,  Cervical, Other (comment) (HD MWF (RUE restricted).) Recall of Precautions/Restrictions: Impaired Precaution/Restrictions Comments: Verbally reviewed cervical precautions. Required Braces or Orthoses: Cervical Brace Cervical Brace: Hard collar Restrictions Weight Bearing Restrictions Per Provider Order: No  Therapy/Group: Individual Therapy  Sheanna Dail PTA 06/22/2023, 12:20 PM

## 2023-06-22 NOTE — Progress Notes (Signed)
 PROGRESS NOTE   Subjective/Complaints:  Pt doing well, slept ok, pain doing fine, LBM yesterday, anuric at baseline. Dialysis yesterday went fine. Denies any other complaints or concerns.   ROS:    Pt denies SOB, abd pain, CP, N/V/C/D, and vision changes   B/L R>L knee pain (+)- R knee very improved  Objective:   No results found.     Recent Labs    06/20/23 1037 06/21/23 1400  WBC 4.9 7.2  HGB 7.6* 7.6*  HCT 24.4* 24.2*  PLT 345 419*    Recent Labs    06/20/23 1037 06/21/23 1400  NA 132* 131*  K 4.5 5.3*  CL 93* 91*  CO2 24 23  GLUCOSE 110* 110*  BUN 61* 82*  CREATININE 6.13* 7.87*  CALCIUM  8.5* 8.6*     Intake/Output Summary (Last 24 hours) at 06/22/2023 1307 Last data filed at 06/22/2023 0732 Gross per 24 hour  Intake 840 ml  Output 3500 ml  Net -2660 ml        Physical Exam: Vital Signs Blood pressure 111/76, pulse 81, temperature 98 F (36.7 C), resp. rate 16, height 6\' 1"  (1.854 m), weight 93 kg, SpO2 100%.   General: awake, alert, appropriate, sitting in w/c; NAD HENT: conjugate gaze; oropharynx moist- soft collar in place CV: regular rate and rhythm; no JVD Pulmonary: CTA B/L; no W/R/R- good air movement GI: soft, NT, ND, (+)BS- normoactive Psychiatric: appropriate- bright affect Neurological: Ox3   PRIOR EXAMS: MSK- R knee much less swollen MsK: B/L knees with effusions- less TTP on R knee this AM , no erythema, not really focally warm to touch- Both have effusions, - R hand swelling looks good/resolved  MSK- R hand swelling esp ulnar side of hand/dorsum and L wrist-stable this AM RUE- Biceps 4/5; WE 4-/5; Triceps 2/5; Grip 2-/5 and FA 1/5 LUE- 4/5 except triceps 3+ to 4-/5 RLE- HF 1 to 2-/5; KE 2-/5; DF/PF 2-/5 LLE- HF 3/5; KE 4-/5; DF/PF 4/5    Skin: C/D/I. Multiple lesions on BL legs with various stages of healing.  + RUE fistula - intact   MSK:      LLE knee with  palpable effusion, no warmth, no ttp Mood: Pleasant affect, appropriate mood.  Neurologic exam: Alert and oriented x 4, fluent, follows commands, cranial nerves II through XII grossly intact, no hypertonia noted  Sensation: Reduced to light touch in distal right lower extremity to the mid-thigh       Strength:                RUE: 2/5 SA, 2/5 EF, 2/5 EE, 2/5 WE, 2/5 FF, 2/5 FA                LUE:  3/5 SA, 3/5 EF, 3/5 EE, 3/5 WE, 3/5 FF, 3/5 FA                RLE: 2/5 HF, 2/5 KE, 2/5  DF, 2/5  EHL, 2/5  PF                 LLE:  3/5 HF, 4/5 KE, 4/5  DF, 4/5  EHL, 4/5  PF  Neuroexam  stable 4/21  Assessment/Plan: 1. Functional deficits which require 3+ hours per day of interdisciplinary therapy in a comprehensive inpatient rehab setting. Physiatrist is providing close team supervision and 24 hour management of active medical problems listed below. Physiatrist and rehab team continue to assess barriers to discharge/monitor patient progress toward functional and medical goals  Care Tool:  Bathing    Body parts bathed by patient: Right arm, Chest, Abdomen, Right upper leg, Left upper leg, Face   Body parts bathed by helper: Left arm, Front perineal area, Buttocks, Right lower leg, Left lower leg     Bathing assist Assist Level: Moderate Assistance - Patient 50 - 74%     Upper Body Dressing/Undressing Upper body dressing   What is the patient wearing?: Pull over shirt    Upper body assist Assist Level: Minimal Assistance - Patient > 75%    Lower Body Dressing/Undressing Lower body dressing      What is the patient wearing?: Pants     Lower body assist Assist for lower body dressing: Moderate Assistance - Patient 50 - 74%     Toileting Toileting Toileting Activity did not occur (Clothing management and hygiene only): N/A (no void or bm) (anuric, HD patient)  Toileting assist Assist for toileting: Maximal Assistance - Patient 25 - 49%     Transfers Chair/bed  transfer  Transfers assist     Chair/bed transfer assist level: Moderate Assistance - Patient 50 - 74% (w/FWW stand pivot x1 a.)     Locomotion Ambulation   Ambulation assist   Ambulation activity did not occur: Safety/medical concerns  Assist level: Minimal Assistance - Patient > 75% Assistive device: Walker-rolling Max distance: 92   Walk 10 feet activity   Assist  Walk 10 feet activity did not occur: Safety/medical concerns  Assist level: Minimal Assistance - Patient > 75% Assistive device: Walker-rolling   Walk 50 feet activity   Assist Walk 50 feet with 2 turns activity did not occur: Safety/medical concerns  Assist level: Minimal Assistance - Patient > 75% Assistive device: Walker-rolling    Walk 150 feet activity   Assist Walk 150 feet activity did not occur: Safety/medical concerns  Assist level: 2 helpers      Walk 10 feet on uneven surface  activity   Assist Walk 10 feet on uneven surfaces activity did not occur: Safety/medical concerns         Wheelchair     Assist Is the patient using a wheelchair?: Yes Type of Wheelchair: Power    Wheelchair assist level: Independent Max wheelchair distance: 300    Wheelchair 50 feet with 2 turns activity    Assist        Assist Level: Independent   Wheelchair 150 feet activity     Assist      Assist Level: Independent   Blood pressure 111/76, pulse 81, temperature 98 F (36.7 C), resp. rate 16, height 6\' 1"  (1.854 m), weight 93 kg, SpO2 100%.  Medical Problem List and Plan: 1. 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 with Dr Gwendlyn Lemmings             -patient may shower without brace with surgical dressing covered - C collar apply/remove brace in sitting, may remove when in bed and to shower and may ambulate to bathroom without brace  -06/09/23 advised pt and son of this, would prefer to wait til Monday to remove collar, ordered soft collar for use  when not in [place], just  for comfort/reminder to not flex/extend much             -ELOS/Goals: 20-25 days, Min A PT/OT  Plan for SNF per family  Con't CIR PT and OT until d/c to SNF  Had to wait on platform walker since felt too wobbly.  2.  Antithrombotics: -DVT/anticoagulation:  Mechanical: Sequential compression devices, below knee Bilateral lower extremities             -antiplatelet therapy: N/A--no antiplatelet due to hx of recurrent GIB  4/25- d/w family- they agree that Lovenox  not appropriate at this time.   -06/09/23 ordered dopplers to r/o clots given low grade temps recently  4/28- LE Dopplers (-) for DVT 3. Pain Management:  oxycodone  prn. Gabapentin  300 mg/hs.              --has Flexeril  10 mg TID prn              - patient with intermittent, severe b/l headache - add topomax 25 mg BID    4/19- pt rating pain 07/19/08 which is "tolerable"  4/22- pain slightly better this AM  4/25- fewer complaints of pain -06/09/23 having more b/l knee pain and using percocets more-- could be causing confusion? Advised judicious use, might benefit from knee aspirations/injections? 4/28- less knee pain this AM 4/30- pain about the same- just started Prednisone  yesterday per Ortho Steffanie Edouard request 5/1- fewer complaints about knee pain 5/2- new pain is doing "well" on prednisone , hopefully does better once prednisone  is stopped after 3 days 5/6- Pt's knees- R knee especially started hurting again yesterday after Steroid dosing- can take Percocet as needed for knees 5/7- got Ortho to come and take fluid off R knee- got 100cc off- pain is no more than 1/10 this AM! 5/8-5/9knee still doing well 4. Mood/Behavior/Sleep: LCSW to follow for evaluation and support             -antipsychotic agents: N/A             --melatonin prn for insomnia.  -06/09/23 poor sleep, d/t interruptions, asked nursing to minimize interruptions between 11p-5a (apparently they can't put a sign up, but will communicate in  huddle) 5. Neuropsych/cognition: This patient is capable of making decisions on his own behalf. 5/8- Ox3 and appropriate 6. Skin/Wound Care: Routine pressure relief measures.   4/19- due to ulcers on legs, on low air loss mattress 7. Fluids/Electrolytes/Nutrition: Strict I/O. Daily weights. Labs with HD. 8. ESRD- HD MWF: Schedule HD at the end of the day to help with tolerance of therapy             --Sensipar  and Renvela  for metabolic bone disease.   - Patient reports no longer makes urine  4/20- pt getting HD this AM- last HD was Wednesday.    9. Anemia of chronic disease: Monitor H/H, aranesp   -4/21 HGB lower 7.5, FOBT, recheck tomorrow  4/22- HB 8.7 on today's check-con't to monitor  -06/09/23 hgb 8.5 yesterday  4/28- Hb down to 7.7- 4/29- will try to recheck Thursday  4/30- if they don't check in HD, will check tomorrow 5/1- Hb down to 7.3- is Jehovah's Witness, so doesn't want transfusions? 5/6- Hb still 7.3- isn't dropping more- No blood or dark stools per pt- no signs of GI loss. 5/7- pt refused labs this AM- wants to get in HD  5/8- Hb 7.1 yesterday after HD- did explained to pt like to get labs before HD so can act on them-  will d/w pt again about transfusion? 5/9- Hb 7.6 in last 24 hours- done yesterday- feels better- no dizziness or fatigue- so doesn't feel transfusion is appropriate  10. H/o recurrent GIB/Jehovah's Witness: Continue Protonix  BID 11. H/o Hypotension: Has had drop  in BP with HD. Now cervical SCI.  --Monitor for symptoms day after HD and with activity. -4/18 baseline blood pressure stable, monitor with activity 4/19- BP actually slightly elevated today- 140s-150's- con't regimen, since can drop with HD and therapy 4/20- BP actually doing better today- in 100s'110s- however HR elevated, likely to fluid overload 4/21 nephrology started midodrine  10mg  before dialysis, BP appears to be doing a little better today 4/22- less hypotensive this AM laying- will  monitor for [facility] 4/23- Tachycardia slightly better- in high 90's- having some intermittent elevated BP's- will monitor 4/24- BP running 140-s150s, but don't want to drop it since having HD 4/25- Having Hypotension with therapy, so don't want to reduce BP at all- doing TED's and ACE wraps- occ elevated, but with HD and therapy, not appropriate to reduce levels 4/29-5/4-  BP controlled- softer on dialysis days but stable; con't regimen 5/5- 5/7BP controlled- con't regimen 5/8- BP on low side in last 24 hours- likely made worse due to HD yesterday- denies OH Sx's. Con't to monitor trend 5/9- BP is running in 90's systolic- doesn't feel dizzy or lightheaded sitting, but could drop when standing-will d/w therapy -06/22/23 stable Vitals:   06/21/23 1445 06/21/23 1500 06/21/23 1515 06/21/23 1530  BP: 126/73 (!) 141/81 (!) 135/91 122/75   06/21/23 1600 06/21/23 1630 06/21/23 1700 06/21/23 1730  BP: 112/73 118/71 115/72 121/75   06/21/23 1750 06/21/23 1800 06/21/23 1955 06/22/23 0540  BP: 113/77 110/74 95/68 111/76     12. Constipation: Small BM on 04/14 --had of nulytey yesterday and another today? -Xray 4/17 without excessive stool, patient reports stool still little hard yesterday.  Will increase MiraLAX  to 3 times a day, continue Senokot twice a day. Suppository PRN   06/09/23 LBM yesterday, cont regimen 4/28- LBM overnight- has been refusing miralax - made it prn- taking metamucil  4/30- asking for Sorbitol  today since needs ot have bigger BM  5/6- LBM yesterday 5/7- feels stools, although usually small, are enough- doesn't want to have accident, so not willing to take Sorbitol   5/8- wants sorbitol  today after therapy- ordered  06/22/23 LBM yesterday, monitor  13. ?left knee effusion. Not currently bothersome. Consider imaging.  14.  SIRS- Fever/hypotension/tachycardia - 4/21 Currently afebrile, he got to 50 cc IVF yesterday, CXR negative, EKG sinus tachycardia. Patient was started on  Maxipime  and vancomycin  yesterday.  Appears to be doing much better today.  WBC not elevated at 7.1.  Continue to follow blood cultures 4/22- Blood Cx's (-) so far- WBC 7.3- will con't IV ABX until know final results of blood Cx's- CXR (-)- but temp was 103.2 yesterday overnight.  4/23- Blood C'x (-) x2 days so far- T 99.9 this Am after a few sips of coffee- was 100.4 this AM- will con't IV ABX since concerned about possible bacteremia- esp since having low grade temps still 4/24- No more fevers, even low grade- no growth on blood Cx' bu it says that didn't get enough blood, so much be falsely negative 4/25- No growth for 5 days, but it also said not enough blood to be sure was a true negative. Will change stop date til tomorrow -06/08/23 at 3am temp recorded as 100.3 but 5am was 98.3; will get CBC  w/diff, monitor temps closely today, if continued low grade temps, will reach out to ID. BCx NGTD x5d. Didn't get vanc yesterday? Or cefepime ? Unclear why. Cefepime  started this morning at 0011. -06/09/23 temp 100.3 at 3am yesterday and then 100 later in the day, but no fevers since, no tachycardia.  -WBC yesterday 8.9.  -Now with some confusion; spoke with Dr. Levern Reader of ID, stated cefepime  could be the cause of the confusion -recommended we stop all abx (which ended yesterday anyway) -watch the patient OFF abx -if fevers >100.3 occur then she recommended repeat BCx and CXR and could call her back to see if any abx would be advised at that time.  -Head CT and dopplers ordered as well, as above 4/28- CT looks good- no fever, WBC is OK- off ABX 4/29- Also Dopplers (-) feeling good this AM 06/16/23 seems to have resolved. 15. Tachycardia 4/22- doing better- right at 100 this AM- will monitor- likely can be from dregs of infection vs OH. -will monitor  4/23- Tachycardia improving- 4/24-27 HR in 90's right now- doing better  4/29- 4/30- HR running in 90s-low 100's today 16. R hand swelling  4/23- doing ice-  will do xrays 4/24- Xray doesn't look like cause of swelling, however could be due to new shunt? 4/30- resolved 17. Hypocalcemia 4/24- his Albumin  is 2.1, so probably ok, and phos coming down- will leave to renal- thank you for their help  5/1- Ca 9.3 5/8- Ca 8.8 18. Hyperkalemia 4/28- K+ 6.3 today- got hypoosmotic bath to reduce K+ per note from RN/HD nurse? Also has Na of 127- being addressed by HD/renal- Phos also 5.8- but again, getting HD today 5/1- K+ down to 5.6 and Na 129 5/6- Na 133 and K+ down to 4.7 from 5.6 5/7- refused labs this AM- wants in HD 5/9- K_ came back as 4.5 19. R>L knee pain 4/29- will ask Ortho if they will tap his R knee- I have no problem doing injection of knee, however with so much fluid, I would rather Ortho tap him and then do steroid injection.   4/30- we added Prednisone  40 mg daily x 3 days- to see if helps knee pain  5/1- mild reduction in pain so far 5/6- Pt has short term improvement from Prednisone  dosing for knees- con't percocet prn 5/7- since pt was doing so much worse with therapy, asked Ortho to come and tap knee and take off fluid- do steroid injection- they drew off 100CC- 5 different draws required- fluid is pending, but has a lot of WBCs- no organisms on gram stain.   5/8- no growth for 24 hours- R knee pain so much better  -06/22/23 NGTD x3 days, monitor    LOS: 23 days A FACE TO FACE EVALUATION WAS PERFORMED  [address] 06/22/2023, 1:07 PM

## 2023-06-23 NOTE — Progress Notes (Signed)
  [facility] Progress Note   Subjective:    Seen in room. Getting ready for online church service. No new events.   Objective Vitals:   06/22/23 0540 06/22/23 1439 06/22/23 1922 06/23/23 0536  BP: 111/76 103/66 101/64 125/72  Pulse: 81 90 86 81  Resp: 16 16 17 17   Temp: 98 F (36.7 C) 98.1 F (36.7 C) 98.9 F (37.2 C) 98 F (36.7 C)  TempSrc:      SpO2: 100% 100% 90%   Weight: 93 kg     Height:       Physical Exam General: Appears well, nad  Heart: RRR Lungs: Clear throughout  Abdomen: Soft, non-distended Extremities: trace edema, wearing compression stockings Dialysis Access:  RUE AVF +bruit   Filed Weights   06/21/23 1355 06/21/23 1800 06/22/23 0540  Weight: 93 kg 89.5 kg 93 kg    Intake/Output Summary (Last 24 hours) at 06/23/2023 1203 Last data filed at 06/23/2023 0749 Gross per 24 hour  Intake 1260 ml  Output --  Net 1260 ml    Additional Objective Labs: Basic Metabolic Panel: Recent Labs  Lab 06/19/23 1505 06/20/23 1037 06/21/23 1400  NA 132* 132* 131*  K 5.5* 4.5 5.3*  CL 95* 93* 91*  CO2 22 24 23   GLUCOSE 135* 110* 110*  BUN 98* 61* 82*  CREATININE 9.20* 6.13* 7.87*  CALCIUM  8.8* 8.5* 8.6*  PHOS 5.5* 3.8 4.5   Liver Function Tests: Recent Labs  Lab 06/19/23 1505 06/20/23 1037 06/21/23 1400  ALBUMIN  2.2* 2.2* 2.3*   No results for input(s): "LIPASE", "AMYLASE" in the last 168 hours. CBC: Recent Labs  Lab 06/18/23 0516 06/19/23 1505 06/20/23 1037 06/21/23 1400  WBC 7.8 7.1 4.9 7.2  NEUTROABS 5.0 5.5  --   --   HGB 7.3* 7.1* 7.6* 7.6*  HCT 22.7* 22.2* 24.4* 24.2*  MCV 89.4 91.4 90.7 89.6  PLT 396 371 345 419*   Blood Culture    Component Value Date/Time   SDES FLUID 06/18/2023 1357   SPECREQUEST NONE 06/18/2023 1357   CULT  06/18/2023 1357    NO GROWTH 3 DAYS Performed at [facility], [address] [place], [place] [address]    REPTSTATUS 06/22/2023 FINAL 06/18/2023 1357    Cardiac Enzymes: No  results for input(s): "CKTOTAL", "CKMB", "CKMBINDEX", "TROPONINI" in the last 168 hours. CBG: No results for input(s): "GLUCAP" in the last 168 hours. Iron Studies: No results for input(s): "IRON", "TIBC", "TRANSFERRIN", "FERRITIN" in the last 72 hours. Lab Results  Component Value Date   INR 1.1 05/27/2023   INR 1.1 05/25/2023   INR 1.1 09/17/2021   Studies/Results: No results found.  Medications:   (feeding supplement) PROSource Plus  30 mL Oral BID BM   atorvastatin   10 mg Oral Q Thu   carbamide peroxide  5 drop Right EAR BID   Chlorhexidine  Gluconate Cloth  6 each Topical Q0600   cinacalcet   120 mg Oral Q supper   darbepoetin (ARANESP ) injection - DIALYSIS  150 mcg Subcutaneous Q Tue-1800   gabapentin   300 mg Oral QHS   Gerhardt's butt cream   Topical BID   pantoprazole   40 mg Oral BID AC   psyllium  1 packet Oral TID   senna-docusate  2 tablet Oral BID   sevelamer  carbonate  3,200 mg Oral TID WC   sodium zirconium cyclosilicate   10 g Oral QODAY   sorbitol   30 mL Oral Once   topiramate   25 mg Oral  BID    Dialysis Orders: OP Dialysis Orders [facility] - MWF 4h  B400   85.5kg   2K bath  AVF   - Heparin  3000 units IV three times per week  - Mircera 60mcg IV q 2 weeks- last dose 05/24/23  - Calcitriol  1.5mcg PO q HD  Assessment/Plan: 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 Next HD 5/12.  Hyperkalemia: K+ 5s lately. Chronic issue.  On Lokelma  on non-HD days while here. 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 Secondary HPTH: CorrCa >10, calcitriol  on hold for now/Phos now at goal, continue sevelamer  and sensipar  Nutrition: Alb 2.0, continue protein supplements  Elona Hal  PA-C [facility] 06/23/2023,12:03 PM

## 2023-06-23 NOTE — Progress Notes (Signed)
 PROGRESS NOTE   Subjective/Complaints:  Pt doing well again today, slept well, pain doing fine again, LBM yesterday, anuric at baseline. Dialysis Friday went fine. Denies any other complaints or concerns.   ROS:    Pt denies SOB, abd pain, CP, N/V/C/D, and vision changes   B/L R>L knee pain (+)- R knee very improved  Objective:   No results found.     Recent Labs    06/20/23 1037 06/21/23 1400  WBC 4.9 7.2  HGB 7.6* 7.6*  HCT 24.4* 24.2*  PLT 345 419*    Recent Labs    06/20/23 1037 06/21/23 1400  NA 132* 131*  K 4.5 5.3*  CL 93* 91*  CO2 24 23  GLUCOSE 110* 110*  BUN 61* 82*  CREATININE 6.13* 7.87*  CALCIUM  8.5* 8.6*     Intake/Output Summary (Last 24 hours) at 06/23/2023 0811 Last data filed at 06/23/2023 0749 Gross per 24 hour  Intake 1260 ml  Output --  Net 1260 ml        Physical Exam: Vital Signs Blood pressure 125/72, pulse 81, temperature 98 F (36.7 C), resp. rate 17, height 6\' 1"  (1.854 m), weight 93 kg, SpO2 90%.   General: awake, alert, appropriate, sitting in w/c in room; NAD HENT: conjugate gaze; oropharynx moist- soft collar in place CV: regular rate and rhythm; no JVD Pulmonary: CTA B/L; no W/R/R- good air movement GI: soft, NT, ND, (+)BS- normoactive Psychiatric: appropriate- bright affect Neurological: Ox3   PRIOR EXAMS: MSK- R knee much less swollen MsK: B/L knees with effusions- less TTP on R knee this AM , no erythema, not really focally warm to touch- Both have effusions, - R hand swelling looks good/resolved  MSK- R hand swelling esp ulnar side of hand/dorsum and L wrist-stable this AM RUE- Biceps 4/5; WE 4-/5; Triceps 2/5; Grip 2-/5 and FA 1/5 LUE- 4/5 except triceps 3+ to 4-/5 RLE- HF 1 to 2-/5; KE 2-/5; DF/PF 2-/5 LLE- HF 3/5; KE 4-/5; DF/PF 4/5    Skin: C/D/I. Multiple lesions on BL legs with various stages of healing.  + RUE fistula - intact    MSK:      LLE knee with palpable effusion, no warmth, no ttp Mood: Pleasant affect, appropriate mood.  Neurologic exam: Alert and oriented x 4, fluent, follows commands, cranial nerves II through XII grossly intact, no hypertonia noted  Sensation: Reduced to light touch in distal right lower extremity to the mid-thigh       Strength:                RUE: 2/5 SA, 2/5 EF, 2/5 EE, 2/5 WE, 2/5 FF, 2/5 FA                LUE:  3/5 SA, 3/5 EF, 3/5 EE, 3/5 WE, 3/5 FF, 3/5 FA                RLE: 2/5 HF, 2/5 KE, 2/5  DF, 2/5  EHL, 2/5  PF                 LLE:  3/5 HF, 4/5 KE, 4/5  DF, 4/5  EHL, 4/5  PF  Neuroexam stable 4/21  Assessment/Plan: 1. Functional deficits which require 3+ hours per day of interdisciplinary therapy in a comprehensive inpatient rehab setting. Physiatrist is providing close team supervision and 24 hour management of active medical problems listed below. Physiatrist and rehab team continue to assess barriers to discharge/monitor patient progress toward functional and medical goals  Care Tool:  Bathing    Body parts bathed by patient: Right arm, Chest, Abdomen, Right upper leg, Left upper leg, Face   Body parts bathed by helper: Left arm, Front perineal area, Buttocks, Right lower leg, Left lower leg     Bathing assist Assist Level: Moderate Assistance - Patient 50 - 74%     Upper Body Dressing/Undressing Upper body dressing   What is the patient wearing?: Pull over shirt    Upper body assist Assist Level: Minimal Assistance - Patient > 75%    Lower Body Dressing/Undressing Lower body dressing      What is the patient wearing?: Pants     Lower body assist Assist for lower body dressing: Moderate Assistance - Patient 50 - 74%     Toileting Toileting Toileting Activity did not occur (Clothing management and hygiene only): N/A (no void or bm) (anuric, HD patient)  Toileting assist Assist for toileting: Maximal Assistance - Patient 25 - 49%      Transfers Chair/bed transfer  Transfers assist     Chair/bed transfer assist level: Moderate Assistance - Patient 50 - 74% (w/FWW stand pivot x1 a.)     Locomotion Ambulation   Ambulation assist   Ambulation activity did not occur: Safety/medical concerns  Assist level: Minimal Assistance - Patient > 75% Assistive device: Walker-rolling Max distance: 92   Walk 10 feet activity   Assist  Walk 10 feet activity did not occur: Safety/medical concerns  Assist level: Minimal Assistance - Patient > 75% Assistive device: Walker-rolling   Walk 50 feet activity   Assist Walk 50 feet with 2 turns activity did not occur: Safety/medical concerns  Assist level: Minimal Assistance - Patient > 75% Assistive device: Walker-rolling    Walk 150 feet activity   Assist Walk 150 feet activity did not occur: Safety/medical concerns  Assist level: 2 helpers      Walk 10 feet on uneven surface  activity   Assist Walk 10 feet on uneven surfaces activity did not occur: Safety/medical concerns         Wheelchair     Assist Is the patient using a wheelchair?: Yes Type of Wheelchair: Power    Wheelchair assist level: Independent Max wheelchair distance: 300    Wheelchair 50 feet with 2 turns activity    Assist        Assist Level: Independent   Wheelchair 150 feet activity     Assist      Assist Level: Independent   Blood pressure 125/72, pulse 81, temperature 98 F (36.7 C), resp. rate 17, height 6\' 1"  (1.854 m), weight 93 kg, SpO2 90%.  Medical Problem List and Plan: 1. 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  2.  Antithrombotics: -DVT/anticoagulation:  Mechanical: Sequential compression devices, below knee Bilateral lower extremities             -antiplatelet therapy: N/A--no antiplatelet due to hx of recurrent GIB  4/25- d/w family- they agree that Lovenox  not appropriate at this time.   -06/09/23 ordered dopplers to r/o clots given low grade temps recently  4/28- LE Dopplers (-) for DVT 3. Pain Management:  oxycodone  prn. Gabapentin  300 mg/hs.              --has Flexeril  10 mg TID prn              - patient with intermittent, severe b/l headache - add topomax 25 mg BID    4/19- pt rating pain 07/19/08 which is "tolerable"  4/22- pain slightly better this AM  4/25- fewer complaints of pain -06/09/23 having more b/l knee pain and using percocets more-- could be causing confusion? Advised judicious use, might benefit from knee aspirations/injections? 4/28- less knee pain this AM 4/30- pain about the same- just started Prednisone  yesterday per Ortho Steffanie Edouard request 5/1- fewer complaints about knee pain 5/2- new pain is doing "well" on prednisone , hopefully does better once prednisone  is stopped after 3 days 5/6- Pt's knees- R knee especially started hurting again yesterday after Steroid dosing- can take Percocet as needed for knees 5/7- got Ortho to come and take fluid off R knee- got 100cc off- pain is no more than 1/10 this AM! 5/8-5/9 knee still doing well 4. Mood/Behavior/Sleep: LCSW to follow for evaluation and support             -antipsychotic agents: N/A             --melatonin prn for insomnia.  -06/09/23 poor sleep, d/t interruptions, asked nursing to minimize interruptions between 11p-5a (apparently they can't put a sign up,  but will communicate in huddle) 5. Neuropsych/cognition: This patient is capable of making decisions on his own behalf. 5/8- Ox3 and appropriate 6. Skin/Wound Care: Routine pressure relief measures.   4/19- due to ulcers on legs, on low air loss mattress 7. Fluids/Electrolytes/Nutrition: Strict I/O. Daily weights. Labs with HD. 8. ESRD- HD MWF: Schedule HD at the end of the day to help with tolerance of therapy             --Sensipar  and Renvela  for metabolic bone disease.   - Patient reports no longer makes urine  4/20- pt getting HD this AM- last HD was Wednesday.    9. Anemia of chronic disease: Monitor H/H, aranesp   -4/21 HGB lower 7.5, FOBT, recheck tomorrow  4/22- HB 8.7 on today's check-con't to monitor  -06/09/23 hgb 8.5 yesterday  4/28- Hb down to 7.7- 4/29- will try to recheck Thursday  4/30- if they don't check in HD, will check tomorrow 5/1- Hb down to 7.3- is Jehovah's Witness, so doesn't want transfusions? 5/6- Hb still 7.3- isn't dropping more- No blood or dark stools per pt- no signs of GI loss. 5/7- pt refused labs this AM- wants to get in HD  5/8- Hb 7.1 yesterday after HD- did explained to pt like to get labs before HD so  can act on them- will d/w pt again about transfusion? 5/9- Hb 7.6 in last 24 hours- done yesterday- feels better- no dizziness or fatigue- so doesn't feel transfusion is appropriate  10. H/o recurrent GIB/Jehovah's Witness: Continue Protonix  BID 11. H/o Hypotension: Has had drop  in BP with HD. Now cervical SCI.  --Monitor for symptoms day after HD and with activity. -4/18 baseline blood pressure stable, monitor with activity 4/19- BP actually slightly elevated today- 140s-150's- con't regimen, since can drop with HD and therapy 4/20- BP actually doing better today- in 100s'110s- however HR elevated, likely to fluid overload 4/21 nephrology started midodrine  10mg  before dialysis, BP appears to be doing a little better today 4/22- less hypotensive  this AM laying- will monitor for [facility] 4/23- Tachycardia slightly better- in high 90's- having some intermittent elevated BP's- will monitor 4/24- BP running 140-s150s, but don't want to drop it since having HD 4/25- Having Hypotension with therapy, so don't want to reduce BP at all- doing TED's and ACE wraps- occ elevated, but with HD and therapy, not appropriate to reduce levels 4/29-5/4-  BP controlled- softer on dialysis days but stable; con't regimen 5/5- 5/7BP controlled- con't regimen 5/8- BP on low side in last 24 hours- likely made worse due to HD yesterday- denies OH Sx's. Con't to monitor trend 5/9- BP is running in 90's systolic- doesn't feel dizzy or lightheaded sitting, but could drop when standing-will d/w therapy -5/10-11/25 stable Vitals:   06/21/23 1530 06/21/23 1600 06/21/23 1630 06/21/23 1700  BP: 122/75 112/73 118/71 115/72   06/21/23 1730 06/21/23 1750 06/21/23 1800 06/21/23 1955  BP: 121/75 113/77 110/74 95/68   06/22/23 0540 06/22/23 1439 06/22/23 1922 06/23/23 0536  BP: 111/76 103/66 101/64 125/72     12. Constipation: Small BM on 04/14 --had of nulytey yesterday and another today? -Xray 4/17 without excessive stool, patient reports stool still little hard yesterday.  Will increase MiraLAX  to 3 times a day, continue Senokot twice a day. Suppository PRN   06/09/23 LBM yesterday, cont regimen 4/28- LBM overnight- has been refusing miralax - made it prn- taking metamucil  4/30- asking for Sorbitol  today since needs ot have bigger BM  5/6- LBM yesterday 5/7- feels stools, although usually small, are enough- doesn't want to have accident, so not willing to take Sorbitol   5/8- wants sorbitol  today after therapy- ordered  06/23/23 LBM yesterday, monitor  13. ?left knee effusion. Not currently bothersome. Consider imaging.  14.  SIRS- Fever/hypotension/tachycardia - 4/21 Currently afebrile, he got to 50 cc IVF yesterday, CXR negative, EKG sinus tachycardia. Patient  was started on Maxipime  and vancomycin  yesterday.  Appears to be doing much better today.  WBC not elevated at 7.1.  Continue to follow blood cultures 4/22- Blood Cx's (-) so far- WBC 7.3- will con't IV ABX until know final results of blood Cx's- CXR (-)- but temp was 103.2 yesterday overnight.  4/23- Blood C'x (-) x2 days so far- T 99.9 this Am after a few sips of coffee- was 100.4 this AM- will con't IV ABX since concerned about possible bacteremia- esp since having low grade temps still 4/24- No more fevers, even low grade- no growth on blood Cx' bu it says that didn't get enough blood, so much be falsely negative 4/25- No growth for 5 days, but it also said not enough blood to be sure was a true negative. Will change stop date til tomorrow -06/08/23 at 3am temp recorded as 100.3 but 5am was 98.3; will  get CBC w/diff, monitor temps closely today, if continued low grade temps, will reach out to ID. BCx NGTD x5d. Didn't get vanc yesterday? Or cefepime ? Unclear why. Cefepime  started this morning at 0011. -06/09/23 temp 100.3 at 3am yesterday and then 100 later in the day, but no fevers since, no tachycardia.  -WBC yesterday 8.9.  -Now with some confusion; spoke with Dr. Levern Reader of ID, stated cefepime  could be the cause of the confusion -recommended we stop all abx (which ended yesterday anyway) -watch the patient OFF abx -if fevers >100.3 occur then she recommended repeat BCx and CXR and could call her back to see if any abx would be advised at that time.  -Head CT and dopplers ordered as well, as above 4/28- CT looks good- no fever, WBC is OK- off ABX 4/29- Also Dopplers (-) feeling good this AM 06/16/23 seems to have resolved. 15. Tachycardia 4/22- doing better- right at 100 this AM- will monitor- likely can be from dregs of infection vs OH. -will monitor  4/23- Tachycardia improving- 4/24-27 HR in 90's right now- doing better  4/29- 4/30- HR running in 90s-low 100's today 16. R hand  swelling  4/23- doing ice- will do xrays 4/24- Xray doesn't look like cause of swelling, however could be due to new shunt? 4/30- resolved 17. Hypocalcemia 4/24- his Albumin  is 2.1, so probably ok, and phos coming down- will leave to renal- thank you for their help  5/1- Ca 9.3 5/8- Ca 8.8 18. Hyperkalemia 4/28- K+ 6.3 today- got hypoosmotic bath to reduce K+ per note from RN/HD nurse? Also has Na of 127- being addressed by HD/renal- Phos also 5.8- but again, getting HD today 5/1- K+ down to 5.6 and Na 129 5/6- Na 133 and K+ down to 4.7 from 5.6 5/7- refused labs this AM- wants in HD 5/9- K_ came back as 4.5 19. R>L knee pain 4/29- will ask Ortho if they will tap his R knee- I have no problem doing injection of knee, however with so much fluid, I would rather Ortho tap him and then do steroid injection.   4/30- we added Prednisone  40 mg daily x 3 days- to see if helps knee pain  5/1- mild reduction in pain so far 5/6- Pt has short term improvement from Prednisone  dosing for knees- con't percocet prn 5/7- since pt was doing so much worse with therapy, asked Ortho to come and tap knee and take off fluid- do steroid injection- they drew off 100CC- 5 different draws required- fluid is pending, but has a lot of WBCs- no organisms on gram stain.   5/8- no growth for 24 hours- R knee pain so much better  -06/22/23 NGTD x3 days, monitor    LOS: 24 days A FACE TO FACE EVALUATION WAS PERFORMED  [id number] 06/23/2023, 8:11 AM

## 2023-06-23 NOTE — Plan of Care (Signed)
  Problem: Consults Goal: RH SPINAL CORD INJURY PATIENT EDUCATION Description:  See Patient Education module for education specifics.  Outcome: Progressing   Problem: SCI BOWEL ELIMINATION Goal: RH STG MANAGE BOWEL WITH ASSISTANCE Description: STG Manage Bowel with minimal Assistance. Outcome: Progressing Goal: RH STG SCI MANAGE BOWEL WITH MEDICATION WITH ASSISTANCE Description: STG SCI Manage bowel with medication with assistance. Outcome: Progressing   Problem: RH SKIN INTEGRITY Goal: RH STG SKIN FREE OF INFECTION/BREAKDOWN Description: Manage skin free of infection/breakdown with minimal assistance Outcome: Progressing   Problem: RH SAFETY Goal: RH STG ADHERE TO SAFETY PRECAUTIONS W/ASSISTANCE/DEVICE Description: STG Adhere to Safety Precautions With minimal Assistance/Device. Outcome: Progressing   Problem: RH PAIN MANAGEMENT Goal: RH STG PAIN MANAGED AT OR BELOW PT'S PAIN GOAL Description: Pain<4 w/ prns Outcome: Progressing   Problem: RH KNOWLEDGE DEFICIT SCI Goal: RH STG INCREASE KNOWLEDGE OF SELF CARE AFTER SCI Description: Manage increase knowledge of self care after SCI with minimal assistance using educational materials provided to son Outcome: Progressing

## 2023-06-24 DIAGNOSIS — Z992 Dependence on renal dialysis: Secondary | ICD-10-CM

## 2023-06-24 DIAGNOSIS — K5900 Constipation, unspecified: Secondary | ICD-10-CM

## 2023-06-24 DIAGNOSIS — E875 Hyperkalemia: Secondary | ICD-10-CM

## 2023-06-24 DIAGNOSIS — I959 Hypotension, unspecified: Secondary | ICD-10-CM

## 2023-06-24 DIAGNOSIS — D631 Anemia in chronic kidney disease: Secondary | ICD-10-CM

## 2023-06-24 DIAGNOSIS — N186 End stage renal disease: Secondary | ICD-10-CM

## 2023-06-24 LAB — RENAL FUNCTION PANEL
Albumin: 2.4 g/dL — ABNORMAL LOW (ref 3.5–5.0)
Anion gap: 16 — ABNORMAL HIGH (ref 5–15)
BUN: 87 mg/dL — ABNORMAL HIGH (ref 8–23)
CO2: 22 mmol/L (ref 22–32)
Calcium: 8.6 mg/dL — ABNORMAL LOW (ref 8.9–10.3)
Chloride: 94 mmol/L — ABNORMAL LOW (ref 98–111)
Creatinine, Ser: 8.84 mg/dL — ABNORMAL HIGH (ref 0.61–1.24)
GFR, Estimated: 6 mL/min — ABNORMAL LOW (ref >60)
Glucose, Bld: 114 mg/dL — ABNORMAL HIGH (ref 70–99)
Phosphorus: 4.5 mg/dL (ref 2.5–4.6)
Potassium: 6.2 mmol/L — ABNORMAL HIGH (ref 3.5–5.1)
Sodium: 132 mmol/L — ABNORMAL LOW (ref 135–145)

## 2023-06-24 LAB — CBC WITH DIFFERENTIAL/PLATELET
Abs Immature Granulocytes: 0.04 10*3/uL (ref 0.00–0.07)
Basophils Absolute: 0.1 10*3/uL (ref 0.0–0.1)
Basophils Relative: 2 %
Eosinophils Absolute: 0.7 10*3/uL — ABNORMAL HIGH (ref 0.0–0.5)
Eosinophils Relative: 9 %
HCT: 25.5 % — ABNORMAL LOW (ref 39.0–52.0)
Hemoglobin: 8.1 g/dL — ABNORMAL LOW (ref 13.0–17.0)
Immature Granulocytes: 1 %
Lymphocytes Relative: 10 %
Lymphs Abs: 0.7 10*3/uL (ref 0.7–4.0)
MCH: 28.5 pg (ref 26.0–34.0)
MCHC: 31.8 g/dL (ref 30.0–36.0)
MCV: 89.8 fL (ref 80.0–100.0)
Monocytes Absolute: 0.9 10*3/uL (ref 0.1–1.0)
Monocytes Relative: 12 %
Neutro Abs: 5.2 10*3/uL (ref 1.7–7.7)
Neutrophils Relative %: 66 %
Platelets: 382 10*3/uL (ref 150–400)
RBC: 2.84 MIL/uL — ABNORMAL LOW (ref 4.22–5.81)
RDW: 16.2 % — ABNORMAL HIGH (ref 11.5–15.5)
WBC: 7.7 10*3/uL (ref 4.0–10.5)
nRBC: 0 % (ref 0.0–0.2)

## 2023-06-24 MED ORDER — HEPARIN SODIUM (PORCINE) 1000 UNIT/ML DIALYSIS: 1000.0000 [IU] | INTRAMUSCULAR | Status: DC | PRN

## 2023-06-24 MED ORDER — ALTEPLASE 2 MG IJ SOLR: 2.0000 mg | Freq: Once | INTRAMUSCULAR | Status: DC | PRN

## 2023-06-24 MED ORDER — ANTICOAGULANT SODIUM CITRATE 4% (200MG/5ML) IV SOLN: 5.0000 mL | Status: DC | PRN

## 2023-06-24 MED ORDER — PENTAFLUOROPROP-TETRAFLUOROETH EX AERO: 1.0000 | INHALATION_SPRAY | CUTANEOUS | Status: DC | PRN

## 2023-06-24 MED ORDER — HEPARIN SODIUM (PORCINE) 1000 UNIT/ML DIALYSIS
3000.0000 [IU] | INTRAMUSCULAR | Status: DC | PRN
  Administered 2023-06-24: 3000 [IU] via INTRAVENOUS_CENTRAL

## 2023-06-24 MED ORDER — LIDOCAINE-PRILOCAINE 2.5-2.5 % EX CREA: 1.0000 | TOPICAL_CREAM | CUTANEOUS | Status: DC | PRN

## 2023-06-24 MED ORDER — LIDOCAINE HCL (PF) 1 % IJ SOLN: 5.0000 mL | INTRAMUSCULAR | Status: DC | PRN

## 2023-06-24 NOTE — Progress Notes (Signed)
 Occupational Therapy Weekly Progress Note  Patient Details  Name: Carl Madriaga Sr. MRN: [id number] Date of Birth: 26-May-1960  Beginning of progress report period: Jun 14, 2023 End of progress report period: Jun 24, 2023  Today's Date: 06/24/2023 OT Individual Time: 4098-1191 OT Individual Time Calculation (min): 81 min    Patient has met 3 of 3 short term goals.  Pt with significant progress in ADLs and progressing with standing techniques and sit to stand transfers.   Patient continues to demonstrate the following deficits: muscle weakness and muscle joint tightness, decreased cardiorespiratoy endurance, and decreased standing balance, decreased postural control, and decreased balance strategies and therefore will continue to benefit from skilled OT intervention to enhance overall performance with BADL, iADL, and Reduce care partner burden.  Patient progressing toward long term goals..  Continue plan of care.  OT Short Term Goals Week 3:  OT Short Term Goal 1 (Week 3): Pt will complete LB dressing with mod A STS from w/c or EOB OT Short Term Goal 1 - Progress (Week 3): Met OT Short Term Goal 2 (Week 3): Pt will complete toileting tasks with max A OT Short Term Goal 2 - Progress (Week 3): Met OT Short Term Goal 3 (Week 3): Pt will complete bathing tasks with mod A OT Short Term Goal 3 - Progress (Week 3): Met Week 4:  OT Short Term Goal 1 (Week 4): Pt will initiate completing 9HPT for demonstration of increased BUE abilities OT Short Term Goal 2 (Week 4): Pt will complete UB dressing with Min A OT Short Term Goal 3 (Week 4): Pt will complete sit to stand at Mod A consistently throughout therapy sessions  Skilled Therapeutic Interventions/Progress Updates:      Therapy Documentation Precautions:  Precautions Precautions: Fall, Cervical, Other (comment) (HD MWF (RUE restricted).) Recall of Precautions/Restrictions: Impaired Precaution/Restrictions Comments: Verbally reviewed  cervical precautions. Required Braces or Orthoses: Cervical Brace Cervical Brace: Hard collar Restrictions Weight Bearing Restrictions Per Provider Order: No General: "I feel good today" Pt seated in W/C upon OT arrival, agreeable to OT. OT offering bathing, pt politely declined bathing tasks.   Pain: 6/10 pain reported in Rt knee, activity, intermittent rest breaks, distractions provided for pain management, pt reports tolerable to proceed.   Balance: OT providing intervention in standing activities in order to promote increased balance strategies with ADL participation. Pt completed all activities at standing level with intermittent supported/unsupported standing at table top. Pt completing sit to stand with Mod A from raised W/C, pt then able to static stand at [facility] with intermittent rest breaks. Pt completed reaching out of BOS to place/retrieve magnetic checker pieces with VC to use pinching technique vs sliding pieces on board.  Exercises: Pt participated in [facility] activity with small pegs, pt retrieving pegs with intermittent use of each BUE and placing them onto peg board for increased dexterity and coordination in order to complete ADLs such as buttoning shirts. Pt completed tasks with PRN rest breaks d/t fatigue in UE. Pt also able to follow design on paper correctly without VC to follow. Increased time to complete task.    Pt seated in W/C at end of session with W/C alarm donned, call light within reach and 4Ps assessed.    Therapy/Group: Individual Therapy [name], OTD, OTR/L 06/24/2023, 12:48 PM

## 2023-06-24 NOTE — Progress Notes (Signed)
 [facility] Progress Note   Subjective:    Patient seen in the room this morning. He reports that he has been doing PT all morning. He reports that he feels well and has no specific complaints. Patient's last HD was 06/21/23.  Objective Vitals:   06/23/23 1501 06/23/23 2018 06/24/23 0500 06/24/23 0601  BP: 114/72 129/76  (!) 147/77  Pulse: 80 81  74  Resp: 18 18  18   Temp: 98.2 F (36.8 C) 97.8 F (36.6 C)  97.7 F (36.5 C)  TempSrc: Oral     SpO2: 98% 100%  100%  Weight:   93 kg   Height:       Physical Exam General: Appears well, nad  Heart: RRR Lungs: Clear throughout  Abdomen: Soft, non-distended Extremities: trace edema, wearing compression stockings Dialysis Access:  RUE AVF +bruit   Filed Weights   06/21/23 1800 06/22/23 0540 06/24/23 0500  Weight: 89.5 kg 93 kg 93 kg    Intake/Output Summary (Last 24 hours) at 06/24/2023 1216 Last data filed at 06/24/2023 0900 Gross per 24 hour  Intake 776 ml  Output --  Net 776 ml    Additional Objective Labs: Basic Metabolic Panel: Recent Labs  Lab 06/19/23 1505 06/20/23 1037 06/21/23 1400  NA 132* 132* 131*  K 5.5* 4.5 5.3*  CL 95* 93* 91*  CO2 22 24 23   GLUCOSE 135* 110* 110*  BUN 98* 61* 82*  CREATININE 9.20* 6.13* 7.87*  CALCIUM  8.8* 8.5* 8.6*  PHOS 5.5* 3.8 4.5   Liver Function Tests: Recent Labs  Lab 06/19/23 1505 06/20/23 1037 06/21/23 1400  ALBUMIN  2.2* 2.2* 2.3*   No results for input(s): "LIPASE", "AMYLASE" in the last 168 hours. CBC: Recent Labs  Lab 06/18/23 0516 06/19/23 1505 06/20/23 1037 06/21/23 1400  WBC 7.8 7.1 4.9 7.2  NEUTROABS 5.0 5.5  --   --   HGB 7.3* 7.1* 7.6* 7.6*  HCT 22.7* 22.2* 24.4* 24.2*  MCV 89.4 91.4 90.7 89.6  PLT 396 371 345 419*   Blood Culture    Component Value Date/Time   SDES FLUID 06/18/2023 1357   SPECREQUEST NONE 06/18/2023 1357   CULT  06/18/2023 1357    NO GROWTH 3 DAYS Performed at [facility], [address],  [place], [place] [address]    REPTSTATUS 06/22/2023 FINAL 06/18/2023 1357    Cardiac Enzymes: No results for input(s): "CKTOTAL", "CKMB", "CKMBINDEX", "TROPONINI" in the last 168 hours. CBG: No results for input(s): "GLUCAP" in the last 168 hours. Iron Studies: No results for input(s): "IRON", "TIBC", "TRANSFERRIN", "FERRITIN" in the last 72 hours. Lab Results  Component Value Date   INR 1.1 05/27/2023   INR 1.1 05/25/2023   INR 1.1 09/17/2021   Studies/Results: No results found.  Medications:  anticoagulant sodium citrate       (feeding supplement) PROSource Plus  30 mL Oral BID BM   atorvastatin   10 mg Oral Q Thu   carbamide peroxide  5 drop Right EAR BID   Chlorhexidine  Gluconate Cloth  6 each Topical Q0600   cinacalcet   120 mg Oral Q supper   darbepoetin (ARANESP ) injection - DIALYSIS  150 mcg Subcutaneous Q Tue-1800   gabapentin   300 mg Oral QHS   Gerhardt's butt cream   Topical BID   pantoprazole   40 mg Oral BID AC   psyllium  1 packet Oral TID   senna-docusate  2 tablet Oral BID   sevelamer  carbonate  3,200 mg Oral TID WC  sodium zirconium cyclosilicate   10 g Oral QODAY   sorbitol   30 mL Oral Once   topiramate   25 mg Oral BID    Dialysis Orders: OP Dialysis Orders [facility] - MWF 4h  B400   85.5kg   2K bath  AVF   - Heparin  3000 units IV three times per week  - Mircera 60mcg IV q 2 weeks- last dose 05/24/23  - Calcitriol  1.5mcg PO q HD  Assessment/Plan: 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 Next HD 5/12.  Hyperkalemia: K+ 5s lately. Chronic issue.  On Lokelma  on non-HD days while here. 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 Secondary HPTH: CorrCa 10, calcitriol  on hold for  now/Phos now at goal, continue sevelamer  and sensipar  Nutrition: Alb 2.3, continue protein supplements  Hersey Lorenzo, NP [facility] 06/24/2023,12:16 PM

## 2023-06-24 NOTE — Plan of Care (Signed)
  Problem: Consults Goal: RH SPINAL CORD INJURY PATIENT EDUCATION Description:  See Patient Education module for education specifics.  Outcome: Progressing   Problem: SCI BOWEL ELIMINATION Goal: RH STG MANAGE BOWEL WITH ASSISTANCE Description: STG Manage Bowel with minimal Assistance. Outcome: Progressing Goal: RH STG SCI MANAGE BOWEL WITH MEDICATION WITH ASSISTANCE Description: STG SCI Manage bowel with medication with assistance. Outcome: Progressing   Problem: RH SKIN INTEGRITY Goal: RH STG SKIN FREE OF INFECTION/BREAKDOWN Description: Manage skin free of infection/breakdown with minimal assistance Outcome: Progressing   Problem: RH SAFETY Goal: RH STG ADHERE TO SAFETY PRECAUTIONS W/ASSISTANCE/DEVICE Description: STG Adhere to Safety Precautions With minimal Assistance/Device. Outcome: Progressing   Problem: RH PAIN MANAGEMENT Goal: RH STG PAIN MANAGED AT OR BELOW PT'S PAIN GOAL Description: Pain<4 w/ prns Outcome: Progressing   Problem: RH KNOWLEDGE DEFICIT SCI Goal: RH STG INCREASE KNOWLEDGE OF SELF CARE AFTER SCI Description: Manage increase knowledge of self care after SCI with minimal assistance using educational materials provided to son Outcome: Progressing

## 2023-06-24 NOTE — Progress Notes (Signed)
   06/24/23 1849  Vitals  Temp 98 F (36.7 C)  Pulse Rate 96  Resp 16  BP 122/83  SpO2 99 %  O2 Device Room Air  Weight 90.7 kg  Type of Weight Post-Dialysis  Post Treatment  Dialyzer Clearance Lightly streaked  Hemodialysis Intake (mL) 0 mL  Liters Processed 80.4  Fluid Removed (mL) 3.15 mL  Tolerated HD Treatment Yes  AVG/AVF Arterial Site Held (minutes) 10 minutes  AVG/AVF Venous Site Held (minutes) 10 minutes   Received patient in bed to unit.  Alert and oriented.  Informed consent signed and in chart.   TX duration:3.15   ---nephrology aware of the time changes today  Patient tolerated well.  Transported back to the room  Alert, without acute distress.  Hand-off given to patient's nurse.   Access used: RUAG Access issues: no complications  Total UF removed: 2300 Medication(s) given: none   [facility]

## 2023-06-24 NOTE — Progress Notes (Signed)
 Physical Therapy Session Note  Patient Details  Name: Carl Pentecost Sr. MRN: [id number] Date of Birth: [birth year]  Today's Date: 06/24/2023 PT Individual Time: 0800-0915, 1045-1200 PT Individual Time Calculation (min): 75 min, 75 min   Short Term Goals: Week 3:  PT Short Term Goal 1 (Week 3): Pt will perform bed mobility with min assist PT Short Term Goal 1 - Progress (Week 3): Met PT Short Term Goal 2 (Week 3): Pt will perform sit <> stands with mod A PT Short Term Goal 2 - Progress (Week 3): Progressing toward goal PT Short Term Goal 3 (Week 3): Pt will be able to gait x 150' with mod assist PT Short Term Goal 3 - Progress (Week 3): Progressing toward goal PT Short Term Goal 4 (Week 3): Pt will be able to independently perform and verbalize pressure relief techniques with PWC PT Short Term Goal 4 - Progress (Week 3): Met  Skilled Therapeutic Interventions/Progress Updates:    Session 1: Pt received in PWC, agreeable to therapy. No complaint of pain. Pt navigates PWC throughout session at mod I level, including leaning forward to manage leg rests.   Pt performed Sit to stand from elevated PWC with light min a. Stand pivot transfer with min a and RW. Session focused on Sit to stand from lower surfaces, 2 x 6 Sit to stand progressing to 24.5" to improve LE strength. Initially raised for sit>stand and lowered for stand>sit to increase eccentric muscle activation.   Pt then performed x2 bouts of cornhole in standing with 1.5 lb ankle weights on wrists for increased UE endurance. Pt able to stand 3-5 min with CGA and intermittent cues for upright posture and full hip extension.   Pt returned to room and remained in w/c with needs in reach.  Session 2: Pt received in PWC, agreeable to therapy. No complaint of pain. Pt navigates PWC throughout session at mod I level, including leaning forward to manage leg rests.  Session focused on gait with RW. Pt required min a overall with instances of  knee instability in later bouts d/t fatigue, but no increased assist required. Pt ambulated x 122ft with RW and w/c follow, then 3 x 90 ft with extended rest breaks. Knee instability noted during last 2 bouts. Provided education and cueing for upright posture and rw proximity.   During extended rest breaks, discussed ACDF and showed pt both his surgical images and diagram of what his plate looks like for improved health literacy and understanding of his condition.   Pt remained in w/c at end of session with needs in reach.   Therapy Documentation Precautions:  Precautions Precautions: Fall, Cervical, Other (comment) (HD MWF (RUE restricted).) Recall of Precautions/Restrictions: Impaired Precaution/Restrictions Comments: Verbally reviewed cervical precautions. Required Braces or Orthoses: Cervical Brace Cervical Brace: Hard collar Restrictions Weight Bearing Restrictions Per Provider Order: No General:       Therapy/Group: Individual Therapy  Aubrey Blackard C Madyn Ivins 06/24/2023, 12:42 PM

## 2023-06-24 NOTE — Progress Notes (Signed)
 PROGRESS NOTE   Subjective/Complaints:  Sitting in bed this morning.  No new complaints or concerns.  LBM today reports having regular bowel movements.  Hemodialysis scheduled today.  Reports he does not make urine.  ROS:    Pt denies Fever, chills,  SOB, abd pain, CP, N/V/C/D, and vision changes   B/L R>L knee pain (+)- R knee very improved  Objective:   No results found.     Recent Labs    06/24/23 1535  WBC 7.7  HGB 8.1*  HCT 25.5*  PLT 382    Recent Labs    06/24/23 1536  NA 132*  K 6.2*  CL 94*  CO2 22  GLUCOSE 114*  BUN 87*  CREATININE 8.84*  CALCIUM  8.6*     Intake/Output Summary (Last 24 hours) at 06/24/2023 1658 Last data filed at 06/24/2023 1323 Gross per 24 hour  Intake 892 ml  Output --  Net 892 ml        Physical Exam: Vital Signs Blood pressure 105/73, pulse 75, temperature 97.6 F (36.4 C), resp. rate (!) 9, height 6\' 1"  (1.854 m), weight 93 kg, SpO2 100%.   General: awake, alert, appropriate, lying in bed; NAD HENT: conjugate gaze; oropharynx moist- soft collar in place CV: regular rate and rhythm; no JVD Pulmonary: CTA B/L; no W/R/R- good air movement GI: soft, NT, ND, (+)BS- normoactive Psychiatric: appropriate- bright affect Mood: Pleasant Neurological: Ox3, follows commands, cranial nerves II through XII grossly intact,   PRIOR EXAMS: MSK- R knee much less swollen MsK: B/L knees with effusions- less TTP on R knee this AM , no erythema, not really focally warm to touch- Both have effusions, - R hand swelling looks good/resolved  MSK- R hand swelling esp ulnar side of hand/dorsum and L wrist-stable this AM RUE- Biceps 4/5; WE 4-/5; Triceps 2/5; Grip 2-/5 and FA 1/5 LUE- 4/5 except triceps 3+ to 4-/5 RLE- HF 1 to 2-/5; KE 2-/5; DF/PF 2-/5 LLE- HF 3/5; KE 4-/5; DF/PF 4/5    Skin: C/D/I. Multiple lesions on BL legs with various stages of healing.  + RUE fistula -  intact   MSK:      LLE knee with palpable effusion, no warmth, no ttp Mood: Pleasant affect, appropriate mood.  Neurologic exam: Alert and oriented x 4, fluent, follows commands, cranial nerves II through XII grossly intact, no hypertonia noted  Sensation: Reduced to light touch in distal right lower extremity to the mid-thigh       Strength:                RUE: 2/5 SA, 2/5 EF, 2/5 EE, 2/5 WE, 2/5 FF, 2/5 FA                LUE:  3/5 SA, 3/5 EF, 3/5 EE, 3/5 WE, 3/5 FF, 3/5 FA                RLE: 2/5 HF, 2/5 KE, 2/5  DF, 2/5  EHL, 2/5  PF                 LLE:  3/5 HF, 4/5 KE, 4/5  DF, 4/5  EHL, 4/5  PF  Neuroexam stable 4/21  Assessment/Plan: 1. Functional deficits which require 3+ hours per day of interdisciplinary therapy in a comprehensive inpatient rehab setting. Physiatrist is providing close team supervision and 24 hour management of active medical problems listed below. Physiatrist and rehab team continue to assess barriers to discharge/monitor patient progress toward functional and medical goals  Care Tool:  Bathing    Body parts bathed by patient: Right arm, Chest, Abdomen, Right upper leg, Left upper leg, Face   Body parts bathed by helper: Left arm, Front perineal area, Buttocks, Right lower leg, Left lower leg     Bathing assist Assist Level: Moderate Assistance - Patient 50 - 74%     Upper Body Dressing/Undressing Upper body dressing   What is the patient wearing?: Pull over shirt    Upper body assist Assist Level: Minimal Assistance - Patient > 75%    Lower Body Dressing/Undressing Lower body dressing      What is the patient wearing?: Pants     Lower body assist Assist for lower body dressing: Moderate Assistance - Patient 50 - 74%     Toileting Toileting Toileting Activity did not occur (Clothing management and hygiene only): N/A (no void or bm) (anuric, HD patient)  Toileting assist Assist for toileting: Maximal Assistance - Patient 25 - 49%      Transfers Chair/bed transfer  Transfers assist     Chair/bed transfer assist level: Moderate Assistance - Patient 50 - 74% (w/FWW stand pivot x1 a.)     Locomotion Ambulation   Ambulation assist   Ambulation activity did not occur: Safety/medical concerns  Assist level: Minimal Assistance - Patient > 75% Assistive device: Walker-rolling Max distance: 92   Walk 10 feet activity   Assist  Walk 10 feet activity did not occur: Safety/medical concerns  Assist level: Minimal Assistance - Patient > 75% Assistive device: Walker-rolling   Walk 50 feet activity   Assist Walk 50 feet with 2 turns activity did not occur: Safety/medical concerns  Assist level: Minimal Assistance - Patient > 75% Assistive device: Walker-rolling    Walk 150 feet activity   Assist Walk 150 feet activity did not occur: Safety/medical concerns  Assist level: 2 helpers      Walk 10 feet on uneven surface  activity   Assist Walk 10 feet on uneven surfaces activity did not occur: Safety/medical concerns         Wheelchair     Assist Is the patient using a wheelchair?: Yes Type of Wheelchair: Power    Wheelchair assist level: Independent Max wheelchair distance: 300    Wheelchair 50 feet with 2 turns activity    Assist        Assist Level: Independent   Wheelchair 150 feet activity     Assist      Assist Level: Independent   Blood pressure 105/73, pulse 75, temperature 97.6 F (36.4 C), resp. rate (!) 9, height 6\' 1"  (1.854 m), weight 93 kg, SpO2 100%.  Medical Problem List and Plan: 1. 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 with Dr Gwendlyn Lemmings             -patient may shower without brace with surgical dressing covered - C collar apply/remove brace in sitting, may remove when in bed and to shower and may ambulate to bathroom without brace  -06/09/23 advised pt and son of this, would prefer to wait til Monday to remove collar,  ordered soft collar for use when not  in [place], just for comfort/reminder to not flex/extend much             -ELOS/Goals: 20-25 days, Min A PT/OT  Plan for SNF per family  Con't CIR PT and OT until d/c to SNF  Had to wait on platform walker since felt too wobbly.   -Team conference tomorrow 2.  Antithrombotics: -DVT/anticoagulation:  Mechanical: Sequential compression devices, below knee Bilateral lower extremities             -antiplatelet therapy: N/A--no antiplatelet due to hx of recurrent GIB  4/25- d/w family- they agree that Lovenox  not appropriate at this time.   -06/09/23 ordered dopplers to r/o clots given low grade temps recently  4/28- LE Dopplers (-) for DVT 3. Pain Management:  oxycodone  prn. Gabapentin  300 mg/hs.              --has Flexeril  10 mg TID prn              - patient with intermittent, severe b/l headache - add topomax 25 mg BID    4/19- pt rating pain 07/19/08 which is "tolerable"  4/22- pain slightly better this AM  4/25- fewer complaints of pain -06/09/23 having more b/l knee pain and using percocets more-- could be causing confusion? Advised judicious use, might benefit from knee aspirations/injections? 4/28- less knee pain this AM 4/30- pain about the same- just started Prednisone  yesterday per Ortho Steffanie Edouard request 5/1- fewer complaints about knee pain 5/2- new pain is doing "well" on prednisone , hopefully does better once prednisone  is stopped after 3 days 5/6- Pt's knees- R knee especially started hurting again yesterday after Steroid dosing- can take Percocet as needed for knees 5/7- got Ortho to come and take fluid off R knee- got 100cc off- pain is no more than 1/10 this AM! 5/8-5/9 knee still doing well 5/9 reports pain controlled 4. Mood/Behavior/Sleep: LCSW to follow for evaluation and support             -antipsychotic agents: N/A             --melatonin prn for insomnia.  -06/09/23 poor sleep, d/t interruptions, asked nursing to minimize  interruptions between 11p-5a (apparently they can't put a sign up, but will communicate in huddle) 5. Neuropsych/cognition: This patient is capable of making decisions on his own behalf. 5/8- Ox3 and appropriate 6. Skin/Wound Care: Routine pressure relief measures.   4/19- due to ulcers on legs, on low air loss mattress 7. Fluids/Electrolytes/Nutrition: Strict I/O. Daily weights. Labs with HD. 8. ESRD- HD MWF: Schedule HD at the end of the day to help with tolerance of therapy             --Sensipar  and Renvela  for metabolic bone disease.   - Patient reports no longer makes urine  4/20- pt getting HD this AM- last HD was Wednesday.    9. Anemia of chronic disease: Monitor H/H, aranesp   -4/21 HGB lower 7.5, FOBT, recheck tomorrow  4/22- HB 8.7 on today's check-con't to monitor  -06/09/23 hgb 8.5 yesterday  4/28- Hb down to 7.7- 4/29- will try to recheck Thursday  4/30- if they don't check in HD, will check tomorrow 5/1- Hb down to 7.3- is Jehovah's Witness, so doesn't want transfusions? 5/6- Hb still 7.3- isn't dropping more- No blood or dark stools per pt- no signs of GI loss. 5/7- pt refused labs this AM- wants to get in HD  5/8- Hb 7.1 yesterday after HD- did explained  to pt like to get labs before HD so can act on them- will d/w pt again about transfusion? 5/9- Hb 7.6 in last 24 hours- done yesterday- feels better- no dizziness or fatigue- so doesn't feel transfusion is appropriate  5/12 HGB up to 8.1 , improved continue to monitor.  He is on Aranesp  per nephrology  10. H/o recurrent GIB/Jehovah's Witness: Continue Protonix  BID 11. H/o Hypotension: Has had drop  in BP with HD. Now cervical SCI.  --Monitor for symptoms day after HD and with activity. -4/18 baseline blood pressure stable, monitor with activity 4/19- BP actually slightly elevated today- 140s-150's- con't regimen, since can drop with HD and therapy 4/20- BP actually doing better today- in 100s'110s- however HR elevated,  likely to fluid overload 4/21 nephrology started midodrine  10mg  before dialysis, BP appears to be doing a little better today 4/22- less hypotensive this AM laying- will monitor for [facility] 4/23- Tachycardia slightly better- in high 90's- having some intermittent elevated BP's- will monitor 4/24- BP running 140-s150s, but don't want to drop it since having HD 4/25- Having Hypotension with therapy, so don't want to reduce BP at all- doing TED's and ACE wraps- occ elevated, but with HD and therapy, not appropriate to reduce levels 4/29-5/4-  BP controlled- softer on dialysis days but stable; con't regimen 5/5- 5/7BP controlled- con't regimen 5/8- BP on low side in last 24 hours- likely made worse due to HD yesterday- denies OH Sx's. Con't to monitor trend 5/9- BP is running in 90's systolic- doesn't feel dizzy or lightheaded sitting, but could drop when standing-will d/w therapy -5/12 BP was a little elevated earlier today but appears to be improved, continue to monitor.  He has dialysis today Vitals:   06/22/23 1439 06/22/23 1922 06/23/23 0536 06/23/23 1501  BP: 103/66 101/64 125/72 114/72   06/23/23 2018 06/24/23 0601 06/24/23 1437 06/24/23 1500  BP: 129/76 (!) 147/77 (!) 152/84 (!) 152/78   06/24/23 1526 06/24/23 1530 06/24/23 1600 06/24/23 1630  BP: (!) 152/81 (!) 155/99 139/87 105/73     12. Constipation: Small BM on 04/14 --had of nulytey yesterday and another today? -Xray 4/17 without excessive stool, patient reports stool still little hard yesterday.  Will increase MiraLAX  to 3 times a day, continue Senokot twice a day. Suppository PRN   06/09/23 LBM yesterday, cont regimen 4/28- LBM overnight- has been refusing miralax - made it prn- taking metamucil  4/30- asking for Sorbitol  today since needs ot have bigger BM  5/6- LBM yesterday 5/7- feels stools, although usually small, are enough- doesn't want to have accident, so not willing to take Sorbitol   5/8- wants sorbitol  today after  therapy- ordered  5/12 LBM today continue to monitor  13. ?left knee effusion. Not currently bothersome. Consider imaging.  14.  SIRS- Fever/hypotension/tachycardia - 4/21 Currently afebrile, he got to 50 cc IVF yesterday, CXR negative, EKG sinus tachycardia. Patient was started on Maxipime  and vancomycin  yesterday.  Appears to be doing much better today.  WBC not elevated at 7.1.  Continue to follow blood cultures 4/22- Blood Cx's (-) so far- WBC 7.3- will con't IV ABX until know final results of blood Cx's- CXR (-)- but temp was 103.2 yesterday overnight.  4/23- Blood C'x (-) x2 days so far- T 99.9 this Am after a few sips of coffee- was 100.4 this AM- will con't IV ABX since concerned about possible bacteremia- esp since having low grade temps still 4/24- No more fevers, even low grade- no growth on  blood Cx' bu it says that didn't get enough blood, so much be falsely negative 4/25- No growth for 5 days, but it also said not enough blood to be sure was a true negative. Will change stop date til tomorrow -06/08/23 at 3am temp recorded as 100.3 but 5am was 98.3; will get CBC w/diff, monitor temps closely today, if continued low grade temps, will reach out to ID. BCx NGTD x5d. Didn't get vanc yesterday? Or cefepime ? Unclear why. Cefepime  started this morning at 0011. -06/09/23 temp 100.3 at 3am yesterday and then 100 later in the day, but no fevers since, no tachycardia.  -WBC yesterday 8.9.  -Now with some confusion; spoke with Dr. Levern Reader of ID, stated cefepime  could be the cause of the confusion -recommended we stop all abx (which ended yesterday anyway) -watch the patient OFF abx -if fevers >100.3 occur then she recommended repeat BCx and CXR and could call her back to see if any abx would be advised at that time.  -Head CT and dopplers ordered as well, as above 4/28- CT looks good- no fever, WBC is OK- off ABX 4/29- Also Dopplers (-) feeling good this AM 06/16/23 seems to have resolved. 15.  Tachycardia 4/22- doing better- right at 100 this AM- will monitor- likely can be from dregs of infection vs OH. -will monitor  4/23- Tachycardia improving- 4/24-27 HR in 90's right now- doing better  4/29- 4/30- HR running in 90s-low 100's today 5/12 heart rate in the 70s and 80s primarily, continue to monitor    06/24/2023    4:30 PM 06/24/2023    4:00 PM 06/24/2023    3:30 PM  Vitals with BMI  Systolic 105 139 409  Diastolic 73 87 99  Pulse 75 77 69    16. R hand swelling  4/23- doing ice- will do xrays 4/24- Xray doesn't look like cause of swelling, however could be due to new shunt? 4/30- resolved 17. Hypocalcemia 4/24- his Albumin  is 2.1, so probably ok, and phos coming down- will leave to renal- thank you for their help  5/1- Ca 9.3 5/8- Ca 8.8 18. Hyperkalemia 4/28- K+ 6.3 today- got hypoosmotic bath to reduce K+ per note from RN/HD nurse? Also has Na of 127- being addressed by HD/renal- Phos also 5.8- but again, getting HD today 5/1- K+ down to 5.6 and Na 129 5/6- Na 133 and K+ down to 4.7 from 5.6 5/7- refused labs this AM- wants in HD 5/9- K_ came back as 4.5 5/12 potassium elevated today, chronic issue has dialysis today.  Treatment per renal team 19. R>L knee pain 4/29- will ask Ortho if they will tap his R knee- I have no problem doing injection of knee, however with so much fluid, I would rather Ortho tap him and then do steroid injection.   4/30- we added Prednisone  40 mg daily x 3 days- to see if helps knee pain  5/1- mild reduction in pain so far 5/6- Pt has short term improvement from Prednisone  dosing for knees- con't percocet prn 5/7- since pt was doing so much worse with therapy, asked Ortho to come and tap knee and take off fluid- do steroid injection- they drew off 100CC- 5 different draws required- fluid is pending, but has a lot of WBCs- no organisms on gram stain.   5/8- no growth for 24 hours- R knee pain so much better  -06/22/23 NGTD x3 days,  monitor    LOS: 25 days A FACE TO FACE EVALUATION  WAS PERFORMED  Lylia Sand 06/24/2023, 4:58 PM

## 2023-06-25 LAB — BASIC METABOLIC PANEL WITH GFR
Anion gap: 13 (ref 5–15)
BUN: 69 mg/dL — ABNORMAL HIGH (ref 8–23)
CO2: 26 mmol/L (ref 22–32)
Calcium: 8.5 mg/dL — ABNORMAL LOW (ref 8.9–10.3)
Chloride: 94 mmol/L — ABNORMAL LOW (ref 98–111)
Creatinine, Ser: 8.18 mg/dL — ABNORMAL HIGH (ref 0.61–1.24)
GFR, Estimated: 7 mL/min — ABNORMAL LOW (ref >60)
Glucose, Bld: 109 mg/dL — ABNORMAL HIGH (ref 70–99)
Potassium: 5.7 mmol/L — ABNORMAL HIGH (ref 3.5–5.1)
Sodium: 133 mmol/L — ABNORMAL LOW (ref 135–145)

## 2023-06-25 MED ORDER — CHLORHEXIDINE GLUCONATE CLOTH 2 % EX PADS: 6.0000 | MEDICATED_PAD | Freq: Every day | CUTANEOUS | Status: DC

## 2023-06-25 NOTE — Progress Notes (Signed)
 Physical Therapy Session Note  Patient Details  Name: [name]. MRN: [id number] Date of Birth: [birth year]  Today's Date: 06/25/2023 PT Individual Time: 0904-1000 PT Individual Time Calculation (min): 56 min   Short Term Goals: Week 4:  PT Short Term Goal 1 (Week 4): Pt will consistantly be able to perform sit <> stands with max A PT Short Term Goal 2 (Week 4): Pt will be able to perform gait x 100' with mod A PT Short Term Goal 3 (Week 4): Pt will be able to perform bed mobility with supervision  Skilled Therapeutic Interventions/Progress Updates: Patient sitting in TIS on entrance to room. Patient alert and agreeable to PT session.   Patient reported no pain  Gait Training:  Pt ambulated 7' in N tower hallway using RW with CGA/close supervision. Pt demonstrated the following gait deviations with therapist providing the described cuing and facilitation for improvement:  - Forward flexed posture with cues to maintain upright posture with forward gaze vs at floor - Decreased step length/cadence  Step Navigation: - Step up to 4" palates board, leading with L LE. Pt with B LE on step with cues to step back and up with R LE only. Added cuing required for pt to increase R hip flexion to clear step. Pt required mod/heavy modA to stand from lower TIS height to // bar (B UE supported) with cues to lean anteriorly. Pt with CGA when standing, and CGA/light minA to step up for safety purposes.   - Progressed to 6" step leading with L LE, then R LE to follow with same cues, then retro-stepping B LE's back down and repeating x 3 with minA for safety  Therapeutic Exercise: Pt performed the following exercises with therapist providing the described cuing and facilitation for improvement. - PTA passively moved R LE into LAQ with cues for pt to "hold" in order to control descent x 10 - Pt cued to perform inversion on R ankle due to everted presentation when doing LAQ. Pt provided with  demonstration. Pt then performed both inversion and eversion with cues to touch PTA hand to act as "gas" and "brake" in a car while sitting in TIS  Patient siting in TIS at end of session with brakes locked, and all needs within reach.      Therapy Documentation Precautions:  Precautions Precautions: Fall, Cervical, Other (comment) (HD MWF) Recall of Precautions/Restrictions: Impaired Precaution/Restrictions Comments: Verbally reviewed cervical precautions. Required Braces or Orthoses: Cervical Brace Cervical Brace: Soft collar, At all times Restrictions Weight Bearing Restrictions Per Provider Order: No  Therapy/Group: Individual Therapy  Yanely Mast PTA 06/25/2023, 12:53 PM

## 2023-06-25 NOTE — Progress Notes (Signed)
 Patient ID: Carl Cappelletti Sr., male   DOB: March 28, 1960, 63 y.o.   MRN: [id number]  Met with pt to give team conference update regarding progress this week and currently looking for SNF. His first choice is [facility] since close to home and home HD center. Will reach out to [facility] to see if has a bed.

## 2023-06-25 NOTE — Plan of Care (Signed)
  Problem: RH SKIN INTEGRITY Goal: RH STG SKIN FREE OF INFECTION/BREAKDOWN Description: Manage skin free of infection/breakdown with minimal assistance Outcome: Progressing   Problem: RH SAFETY Goal: RH STG ADHERE TO SAFETY PRECAUTIONS W/ASSISTANCE/DEVICE Description: STG Adhere to Safety Precautions With minimal Assistance/Device. Outcome: Progressing   Problem: RH PAIN MANAGEMENT Goal: RH STG PAIN MANAGED AT OR BELOW PT'S PAIN GOAL Description: Pain<4 w/ prns Outcome: Progressing

## 2023-06-25 NOTE — Progress Notes (Signed)
 PROGRESS NOTE   Subjective/Complaints:  Pt reports didn't get diner til 9pm- a hamburger Walked over 100 ft with therapy yesterday- very excited about progress.   K+ 6.2 last afternoon during HD- cannot see treatment per progress notes?  ROS:    Pt denies SOB, abd pain, CP, N/V/C/D, and vision changes  B/L R>L knee pain (+)- R knee very improved  Objective:   No results found.     Recent Labs    06/24/23 1535  WBC 7.7  HGB 8.1*  HCT 25.5*  PLT 382    Recent Labs    06/24/23 1536  NA 132*  K 6.2*  CL 94*  CO2 22  GLUCOSE 114*  BUN 87*  CREATININE 8.84*  CALCIUM  8.6*     Intake/Output Summary (Last 24 hours) at 06/25/2023 0834 Last data filed at 06/25/2023 0759 Gross per 24 hour  Intake 708 ml  Output 3.15 ml  Net 704.85 ml        Physical Exam: Vital Signs Blood pressure 127/85, pulse 87, temperature 98.7 F (37.1 C), temperature source Oral, resp. rate 17, height 6\' 1"  (1.854 m), weight 94.4 kg, SpO2 100%.      General: awake, alert, appropriate, sitting EOB, finishing breakfast tray; slumped posture; NAD HENT: conjugate gaze; oropharynx moist CV: regular rate and rhythm; no JVD Pulmonary: CTA B/L; no W/R/R- good air movement GI: soft, NT, ND, (+)BS Psychiatric: appropriate- making jokes today Neurological: Ox3  Neurological: Ox3, follows commands, cranial nerves II through XII grossly intact,   PRIOR EXAMS: MSK- R knee much less swollen MsK: B/L knees with effusions- less TTP on R knee this AM- looks good- mild effusion , no erythema, not really focally warm to touch- Both have effusions, - R hand swelling looks good/resolved  MSK- R hand swelling esp ulnar side of hand/dorsum and L wrist-stable this AM RUE- Biceps 4/5; WE 4-/5; Triceps 2/5; Grip 2-/5 and FA 1/5 LUE- 4/5 except triceps 3+ to 4-/5 RLE- HF 1 to 2-/5; KE 2-/5; DF/PF 2-/5 LLE- HF 3/5; KE 4-/5; DF/PF 4/5     Skin: C/D/I. Multiple lesions on BL legs with various stages of healing.  + RUE fistula - intact   MSK:      LLE knee with palpable effusion, no warmth, no ttp Mood: Pleasant affect, appropriate mood.  Neurologic exam: Alert and oriented x 4, fluent, follows commands, cranial nerves II through XII grossly intact, no hypertonia noted  Sensation: Reduced to light touch in distal right lower extremity to the mid-thigh       Strength:                RUE: 2/5 SA, 2/5 EF, 2/5 EE, 2/5 WE, 2/5 FF, 2/5 FA                LUE:  3/5 SA, 3/5 EF, 3/5 EE, 3/5 WE, 3/5 FF, 3/5 FA                RLE: 2/5 HF, 2/5 KE, 2/5  DF, 2/5  EHL, 2/5  PF                 LLE:  3/5 HF, 4/5 KE, 4/5  DF, 4/5  EHL, 4/5  PF  Neuroexam stable 4/21  Assessment/Plan: 1. Functional deficits which require 3+ hours per day of interdisciplinary therapy in a comprehensive inpatient rehab setting. Physiatrist is providing close team supervision and 24 hour management of active medical problems listed below. Physiatrist and rehab team continue to assess barriers to discharge/monitor patient progress toward functional and medical goals  Care Tool:  Bathing    Body parts bathed by patient: Right arm, Chest, Abdomen, Right upper leg, Left upper leg, Face   Body parts bathed by helper: Left arm, Front perineal area, Buttocks, Right lower leg, Left lower leg     Bathing assist Assist Level: Moderate Assistance - Patient 50 - 74%     Upper Body Dressing/Undressing Upper body dressing   What is the patient wearing?: Pull over shirt    Upper body assist Assist Level: Minimal Assistance - Patient > 75%    Lower Body Dressing/Undressing Lower body dressing      What is the patient wearing?: Pants     Lower body assist Assist for lower body dressing: Moderate Assistance - Patient 50 - 74%     Toileting Toileting Toileting Activity did not occur (Clothing management and hygiene only): N/A (no void or bm) (anuric, HD  patient)  Toileting assist Assist for toileting: Maximal Assistance - Patient 25 - 49%     Transfers Chair/bed transfer  Transfers assist     Chair/bed transfer assist level: Moderate Assistance - Patient 50 - 74% (w/FWW stand pivot x1 a.)     Locomotion Ambulation   Ambulation assist   Ambulation activity did not occur: Safety/medical concerns  Assist level: Minimal Assistance - Patient > 75% Assistive device: Walker-rolling Max distance: 92   Walk 10 feet activity   Assist  Walk 10 feet activity did not occur: Safety/medical concerns  Assist level: Minimal Assistance - Patient > 75% Assistive device: Walker-rolling   Walk 50 feet activity   Assist Walk 50 feet with 2 turns activity did not occur: Safety/medical concerns  Assist level: Minimal Assistance - Patient > 75% Assistive device: Walker-rolling    Walk 150 feet activity   Assist Walk 150 feet activity did not occur: Safety/medical concerns  Assist level: 2 helpers      Walk 10 feet on uneven surface  activity   Assist Walk 10 feet on uneven surfaces activity did not occur: Safety/medical concerns         Wheelchair     Assist Is the patient using a wheelchair?: Yes Type of Wheelchair: Power    Wheelchair assist level: Independent Max wheelchair distance: 300    Wheelchair 50 feet with 2 turns activity    Assist        Assist Level: Independent   Wheelchair 150 feet activity     Assist      Assist Level: Independent   Blood pressure 127/85, pulse 87, temperature 98.7 F (37.1 C), temperature source Oral, resp. rate 17, height 6\' 1"  (1.854 m), weight 94.4 kg, SpO2 100%.  Medical Problem List and Plan: 1. 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  Antithrombotics: -DVT/anticoagulation:  Mechanical: Sequential compression devices, below knee Bilateral lower extremities             -antiplatelet therapy: N/A--no antiplatelet due to hx of recurrent GIB  4/25- d/w family- they agree that Lovenox  not appropriate at this time.   -06/09/23 ordered dopplers to r/o clots given low grade temps recently  4/28- LE Dopplers (-) for DVT 3. Pain Management:  oxycodone  prn. Gabapentin  300 mg/hs.              --has Flexeril  10 mg TID prn              - patient with intermittent, severe b/l headache - add topomax 25 mg BID    4/19- pt rating pain 07/19/08 which is "tolerable" 5/7- got Ortho to come and take fluid off R knee- got 100cc off- pain is no more than 1/10 this AM! 5/8-5/9 knee still doing well 5/13- pain controlled- allowed him to walk further yesterday in therapy 4. Mood/Behavior/Sleep: LCSW to follow for evaluation and support             -antipsychotic agents: N/A             --melatonin prn for insomnia.  -06/09/23 poor sleep, d/t interruptions, asked nursing to minimize interruptions between 11p-5a (apparently they can't put a sign up, but will communicate in huddle) 5. Neuropsych/cognition: This patient is capable of making decisions on his own behalf. 5/8- Ox3 and appropriate 6. Skin/Wound Care: Routine pressure relief measures.   4/19- due to ulcers on legs, on low air loss mattress 7. Fluids/Electrolytes/Nutrition: Strict I/O. Daily weights. Labs with HD. 8. ESRD- HD MWF: Schedule HD at the end of the day to help with tolerance of therapy              --Sensipar  and Renvela  for metabolic bone disease.   - Patient reports no longer makes urine  5/13 - last HD yesterday 9. Anemia of chronic disease: Monitor H/H, aranesp   -4/21 HGB lower 7.5, FOBT, recheck tomorrow  4/22- HB 8.7 on today's check-con't to monitor  -06/09/23 hgb 8.5 yesterday  4/28- Hb down to 7.7- 4/29- will try to recheck Thursday  4/30- if they don't check in HD, will check tomorrow 5/1- Hb down to 7.3- is Jehovah's Witness, so doesn't want transfusions? 5/6- Hb still 7.3- isn't dropping more- No blood or dark stools per pt- no signs of GI loss. 5/7- pt refused labs this AM- wants to get in HD  5/8- Hb 7.1 yesterday after HD- did explained to pt like to get labs before HD so can act on them- will d/w pt again about transfusion? 5/9- Hb 7.6 in last 24 hours- done yesterday- feels better- no dizziness or fatigue- so doesn't feel transfusion is appropriate  5/12 HGB up to 8.1 , improved continue to monitor.  He is on Aranesp  per nephrology  10. H/o recurrent GIB/Jehovah's Witness: Continue Protonix  BID 11. H/o Hypotension: Has had drop  in BP with HD. Now cervical SCI.  --Monitor for symptoms day after HD and with activity. -  4/18 baseline blood pressure stable, monitor with activity 4/19- BP actually slightly elevated today- 140s-150's- con't regimen, since can drop with HD and therapy 4/20- BP actually doing better today- in 100s'110s- however HR elevated, likely to fluid overload 4/21 nephrology started midodrine  10mg  before dialysis, BP appears to be doing a little better today 5/8- BP on low side in last 24 hours- likely made worse due to HD yesterday- denies OH Sx's. Con't to monitor trend 5/9- BP is running in 90's systolic- doesn't feel dizzy or lightheaded sitting, but could drop when standing-will d/w therapy -5/12 BP was a little elevated earlier today but appears to be improved, continue to monitor.  He has dialysis today 5/13- BP running normal to very slightly  elevated, which is good, since had HD yesterday Vitals:   06/24/23 1526 06/24/23 1530 06/24/23 1600 06/24/23 1630  BP: (!) 152/81 (!) 155/99 139/87 105/73   06/24/23 1700 06/24/23 1730 06/24/23 1800 06/24/23 1830  BP: 138/81 121/72 121/75 113/74   06/24/23 1842 06/24/23 1849 06/24/23 1936 06/25/23 0430  BP: 121/68 122/83 (!) 140/85 127/85     12. Constipation: Small BM on 04/14 --had of nulytey yesterday and another today? -Xray 4/17 without excessive stool, patient reports stool still little hard yesterday.  Will increase MiraLAX  to 3 times a day, continue Senokot twice a day. Suppository PRN   06/09/23 LBM yesterday, cont regimen 4/28- LBM overnight- has been refusing miralax - made it prn- taking metamucil  5/13- LBM this AM 13. ?left knee effusion. Not currently bothersome. Consider imaging.  14.  SIRS- Fever/hypotension/tachycardia - 4/21 Currently afebrile, he got to 50 cc IVF yesterday, CXR negative, EKG sinus tachycardia. Patient was started on Maxipime  and vancomycin  yesterday.  Appears to be doing much better today.  WBC not elevated at 7.1.  Continue to follow blood cultures 4/22- Blood Cx's (-) so far- WBC 7.3- will con't IV ABX until know final results of blood Cx's- CXR (-)- but temp was 103.2 yesterday overnight.  4/23- Blood C'x (-) x2 days so far- T 99.9 this Am after a few sips of coffee- was 100.4 this AM- will con't IV ABX since concerned about possible bacteremia- esp since having low grade temps still 4/24- No more fevers, even low grade- no growth on blood Cx' bu it says that didn't get enough blood, so much be falsely negative 4/25- No growth for 5 days, but it also said not enough blood to be sure was a true negative. Will change stop date til tomorrow -06/08/23 at 3am temp recorded as 100.3 but 5am was 98.3; will get CBC w/diff, monitor temps closely today, if continued low grade temps, will reach out to ID. BCx NGTD x5d. Didn't get vanc yesterday? Or cefepime ?  Unclear why. Cefepime  started this morning at 0011. -06/09/23 temp 100.3 at 3am yesterday and then 100 later in the day, but no fevers since, no tachycardia.  -WBC yesterday 8.9.  -Now with some confusion; spoke with Dr. Levern Reader of ID, stated cefepime  could be the cause of the confusion -recommended we stop all abx (which ended yesterday anyway) -watch the patient OFF abx -if fevers >100.3 occur then she recommended repeat BCx and CXR and could call her back to see if any abx would be advised at that time.  -Head CT and dopplers ordered as well, as above 4/28- CT looks good- no fever, WBC is OK- off ABX 4/29- Also Dopplers (-) feeling good this AM 06/16/23 seems to have resolved. 15. Tachycardia 4/22-  doing better- right at 100 this AM- will monitor- likely can be from dregs of infection vs OH. -will monitor  4/23- Tachycardia improving- 4/24-27 HR in 90's right now- doing better  4/29- 4/30- HR running in 90s-low 100's today 5/12 heart rate in the 70s and 80s primarily, continue to monitor    06/25/2023    4:30 AM 06/25/2023    4:00 AM 06/24/2023    7:36 PM  Vitals with BMI  Weight  208 lbs 2 oz   BMI  27.46   Systolic 127  140  Diastolic 85  85  Pulse 87  89    16. R hand swelling  4/23- doing ice- will do xrays 4/24- Xray doesn't look like cause of swelling, however could be due to new shunt? 4/30- resolved 17. Hypocalcemia 4/24- his Albumin  is 2.1, so probably ok, and phos coming down- will leave to renal- thank you for their help  5/1- Ca 9.3 5/8- Ca 8.8 18. Hyperkalemia 4/28- K+ 6.3 today- got hypoosmotic bath to reduce K+ per note from RN/HD nurse? Also has Na of 127- being addressed by HD/renal- Phos also 5.8- but again, getting HD today 5/1- K+ down to 5.6 and Na 129 5/6- Na 133 and K+ down to 4.7 from 5.6 5/7- refused labs this AM- wants in HD 5/9- K_ came back as 4.5 5/12 potassium elevated today, chronic issue has dialysis today.  Treatment per renal team 19. R>L  knee pain 4/29- will ask Ortho if they will tap his R knee- I have no problem doing injection of knee, however with so much fluid, I would rather Ortho tap him and then do steroid injection.   4/30- we added Prednisone  40 mg daily x 3 days- to see if helps knee pain  5/1- mild reduction in pain so far 5/6- Pt has short term improvement from Prednisone  dosing for knees- con't percocet prn 5/7- since pt was doing so much worse with therapy, asked Ortho to come and tap knee and take off fluid- do steroid injection- they drew off 100CC- 5 different draws required- fluid is pending, but has a lot of WBCs- no organisms on gram stain.   5/8- no growth for 24 hours- R knee pain so much better  -06/22/23 NGTD x3 days, monitor   I spent a total of  35  minutes on total care today- >50% coordination of care- due to  D/w pt about plan for dispo- also about walking, pain; and reviewed labs and vitals- also team conference to f/u on progress.     LOS: 26 days A FACE TO FACE EVALUATION WAS PERFORMED  [name] 06/25/2023, 8:34 AM

## 2023-06-25 NOTE — Progress Notes (Signed)
 Occupational Therapy Session Note  Patient Details  Name: Carl Ravens Sr. MRN: [id number] Date of Birth: 10-18-1960  Today's Date: 06/25/2023 OT Individual Time: 1030-1130 OT Individual Time Calculation (min): 60 min    Short Term Goals: Week 4:  OT Short Term Goal 1 (Week 4): Pt will initiate completing 9HPT for demonstration of increased BUE abilities OT Short Term Goal 2 (Week 4): Pt will complete UB dressing with Min A OT Short Term Goal 3 (Week 4): Pt will complete sit to stand at Mod A consistently throughout therapy sessions  Skilled Therapeutic Interventions/Progress Updates:      Therapy Documentation Precautions:  Precautions Precautions: Fall, Cervical, Other (comment) (HD MWF) Recall of Precautions/Restrictions: Impaired Precaution/Restrictions Comments: Verbally reviewed cervical precautions. Required Braces or Orthoses: Cervical Brace Cervical Brace: Soft collar, At all times Restrictions Weight Bearing Restrictions Per Provider Order: No General: "Hey there Abraham Hoffmann" Pt seated in W/C upon OT arrival, agreeable to OT.  Pain:no pain reported  Exercises: OT providing skilled intervention in BUE [facility] activity with pink theraputty for increased dexterity and coordination in order to complete ADLs such as buttoning shirts and feeding. Pt completing rolling putty into log and rolling putty balls within hands for increased stretching of hand/finger musculature and increase palmar arches   Other Treatments: OT instructing pt on 9HPT in order to complete standardized test for baseline BUE function. Pt scored R-1:43  L-1:28 which is not WNL of age and sex per pt demographics, although large improvement to complete 9HPT d/t increase hand function since eval. Pt will be re-tested near D/C to determine increase of function.    Pt seated in W/C at end of session with W/C alarm donned, call light within reach and 4Ps assessed.     Therapy/Group: Individual Therapy  [name], OTD, OTR/L 06/25/2023, 12:16 PM

## 2023-06-25 NOTE — Progress Notes (Signed)
 Physical Therapy Session Note  Patient Details  Name: Carl Shrieves Sr. MRN: [id number] Date of Birth: 05-Dec-1960  Today's Date: 06/25/2023 PT Individual Time: 1335-1415 PT Individual Time Calculation (min): 40 min   Short Term Goals: Week 4:  PT Short Term Goal 1 (Week 4): Pt will consistantly be able to perform sit <> stands with max A PT Short Term Goal 2 (Week 4): Pt will be able to perform gait x 100' with mod A PT Short Term Goal 3 (Week 4): Pt will be able to perform bed mobility with supervision  Skilled Therapeutic Interventions/Progress Updates:    Pt received in PWC, agreeable to therapy. No complaint of pain. Pt navigated PWC at mod I level throughout session.   Pt performed Sit to stand from elevated surfaces with min a, including block practice at end of session to build strength. Cues for upright trunk and hip extension. Pt navigated x1 step with mod a and 2 hand rails to build strength and endurance as home entrance includes x 18 steps. Discussed safety using lateral technique in the distant future when pt returns to navigating stairs. Pt became emotional over current condition and thinking about whether he will be able to return to navigating that many steps independently. Provided active listening and therapeutic use of self for psychosocial support.   Therapist also retrieved and adjusted more appropriate adduction pad to reduce windswept hips without use of pool noodles in PWC. Pt remained in chair at end of session with needs in reach.   Therapy Documentation Precautions:  Precautions Precautions: Fall, Cervical, Other (comment) (HD MWF) Recall of Precautions/Restrictions: Impaired Precaution/Restrictions Comments: Verbally reviewed cervical precautions. Required Braces or Orthoses: Cervical Brace Cervical Brace: Soft collar, At all times Restrictions Weight Bearing Restrictions Per Provider Order: No General:       Therapy/Group: Individual Therapy  Tex Filbert 06/25/2023, 3:59 PM

## 2023-06-25 NOTE — Progress Notes (Addendum)
 [facility] Progress Note   Subjective:    Patient seen while getting therapy with PT. No reported complaints of dyspnea. Patient had HD 06/24/23 and they were able to UF 2.3L off of him. Patient's last K 6.2 on lokelma  every other day. I am rechecking his BMET today.   Objective Vitals:   06/24/23 1849 06/24/23 1936 06/25/23 0400 06/25/23 0430  BP: 122/83 (!) 140/85  127/85  Pulse: 96 89  87  Resp: 16 17  17   Temp: 98 F (36.7 C) 97.7 F (36.5 C)  98.7 F (37.1 C)  TempSrc:  Oral  Oral  SpO2: 99% 100%  100%  Weight: 90.7 kg  94.4 kg   Height:       Physical Exam General: Appears well, nad  Heart: RRR Lungs: Clear throughout  Abdomen: Soft, non-distended Extremities: trace edema, wearing compression stockings Dialysis Access:  RUE AVF +bruit   Filed Weights   06/24/23 1500 06/24/23 1849 06/25/23 0400  Weight: 93 kg 90.7 kg 94.4 kg    Intake/Output Summary (Last 24 hours) at 06/25/2023 1159 Last data filed at 06/25/2023 0759 Gross per 24 hour  Intake 472 ml  Output 3.15 ml  Net 468.85 ml    Additional Objective Labs: Basic Metabolic Panel: Recent Labs  Lab 06/20/23 1037 06/21/23 1400 06/24/23 1536  NA 132* 131* 132*  K 4.5 5.3* 6.2*  CL 93* 91* 94*  CO2 24 23 22   GLUCOSE 110* 110* 114*  BUN 61* 82* 87*  CREATININE 6.13* 7.87* 8.84*  CALCIUM  8.5* 8.6* 8.6*  PHOS 3.8 4.5 4.5   Liver Function Tests: Recent Labs  Lab 06/20/23 1037 06/21/23 1400 06/24/23 1536  ALBUMIN  2.2* 2.3* 2.4*   No results for input(s): "LIPASE", "AMYLASE" in the last 168 hours. CBC: Recent Labs  Lab 06/19/23 1505 06/20/23 1037 06/21/23 1400 06/24/23 1535  WBC 7.1 4.9 7.2 7.7  NEUTROABS 5.5  --   --  5.2  HGB 7.1* 7.6* 7.6* 8.1*  HCT 22.2* 24.4* 24.2* 25.5*  MCV 91.4 90.7 89.6 89.8  PLT 371 345 419* 382   Blood Culture    Component Value Date/Time   SDES FLUID 06/18/2023 1357   SPECREQUEST NONE 06/18/2023 1357   CULT  06/18/2023 1357    NO GROWTH 3  DAYS Performed at [facility], [address], [place], [place] [address]    REPTSTATUS 06/22/2023 FINAL 06/18/2023 1357    Cardiac Enzymes: No results for input(s): "CKTOTAL", "CKMB", "CKMBINDEX", "TROPONINI" in the last 168 hours. CBG: No results for input(s): "GLUCAP" in the last 168 hours. Iron Studies: No results for input(s): "IRON", "TIBC", "TRANSFERRIN", "FERRITIN" in the last 72 hours. Lab Results  Component Value Date   INR 1.1 05/27/2023   INR 1.1 05/25/2023   INR 1.1 09/17/2021   Studies/Results: No results found.  Medications:    (feeding supplement) PROSource Plus  30 mL Oral BID BM   atorvastatin   10 mg Oral Q Thu   carbamide peroxide  5 drop Right EAR BID   Chlorhexidine  Gluconate Cloth  6 each Topical Q0600   cinacalcet   120 mg Oral Q supper   darbepoetin (ARANESP ) injection - DIALYSIS  150 mcg Subcutaneous Q Tue-1800   gabapentin   300 mg Oral QHS   Gerhardt's butt cream   Topical BID   pantoprazole   40 mg Oral BID AC   psyllium  1 packet Oral TID   senna-docusate  2 tablet Oral BID   sevelamer  carbonate  3,200 mg Oral TID WC   sodium zirconium cyclosilicate   10 g Oral QODAY   sorbitol   30 mL Oral Once   topiramate   25 mg Oral BID    Dialysis Orders: OP Dialysis Orders [facility] - MWF 4h  B400   85.5kg   2K bath  AVF   - Heparin  3000 units IV three times per week  - Mircera 60mcg IV q 2 weeks- last dose 05/24/23  - Calcitriol  1.5mcg PO q HD  Assessment/Plan: 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 Next HD 5/12.  Hyperkalemia: K+ 6.2 on 06/24/23. Chronic issue.  On Lokelma  on non-HD days while here. Monitor trend. Needs updated electrolytes. I ordered updated bmet to check potassium.  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  He is a TEFL teacher Witness and does not  accept blood products except in emergent situations. Secondary HPTH: CorrCa 10, calcitriol  on hold for now/Phos now at goal, continue sevelamer  and sensipar  Nutrition: Alb 2.4, continue protein supplements  Hersey Lorenzo, NP [facility] 06/25/2023,11:59 AM

## 2023-06-26 ENCOUNTER — Encounter: Admitting: Orthopedic Surgery

## 2023-06-26 LAB — RENAL FUNCTION PANEL
Albumin: 2.4 g/dL — ABNORMAL LOW (ref 3.5–5.0)
Anion gap: 15 (ref 5–15)
BUN: 85 mg/dL — ABNORMAL HIGH (ref 8–23)
CO2: 23 mmol/L (ref 22–32)
Calcium: 8.4 mg/dL — ABNORMAL LOW (ref 8.9–10.3)
Chloride: 94 mmol/L — ABNORMAL LOW (ref 98–111)
Creatinine, Ser: 9.17 mg/dL — ABNORMAL HIGH (ref 0.61–1.24)
GFR, Estimated: 6 mL/min — ABNORMAL LOW (ref >60)
Glucose, Bld: 112 mg/dL — ABNORMAL HIGH (ref 70–99)
Phosphorus: 5 mg/dL — ABNORMAL HIGH (ref 2.5–4.6)
Potassium: 5.5 mmol/L — ABNORMAL HIGH (ref 3.5–5.1)
Sodium: 132 mmol/L — ABNORMAL LOW (ref 135–145)

## 2023-06-26 MED ORDER — SODIUM ZIRCONIUM CYCLOSILICATE 10 G PO PACK
10.0000 g | PACK | Freq: Every day | ORAL | Status: DC
  Administered 2023-06-27 – 2023-07-15 (×19): 10 g via ORAL
  Filled 2023-06-26 (×21): qty 1

## 2023-06-26 NOTE — Progress Notes (Signed)
 Unable to complete full treatment. 3.4L removed during hemodialysis. Vital signs are stable. Post weight of 91.4kg.

## 2023-06-26 NOTE — Plan of Care (Signed)
  Problem: SCI BOWEL ELIMINATION Goal: RH STG MANAGE BOWEL WITH ASSISTANCE Description: STG Manage Bowel with minimal Assistance. Outcome: Progressing   Problem: RH SKIN INTEGRITY Goal: RH STG SKIN FREE OF INFECTION/BREAKDOWN Description: Manage skin free of infection/breakdown with minimal assistance Outcome: Progressing   Problem: RH SAFETY Goal: RH STG ADHERE TO SAFETY PRECAUTIONS W/ASSISTANCE/DEVICE Description: STG Adhere to Safety Precautions With minimal Assistance/Device. Outcome: Progressing   Problem: RH PAIN MANAGEMENT Goal: RH STG PAIN MANAGED AT OR BELOW PT'S PAIN GOAL Description: Pain<4 w/ prns Outcome: Progressing

## 2023-06-26 NOTE — Patient Care Conference (Signed)
 Inpatient RehabilitationTeam Conference and Plan of Care Update Date: 06/25/2023   Time: 1151 am    Patient Name: Carl Zarn Sr.      Medical Record Number: [id number]  Date of Birth: [birth year] Sex: Male         Room/Bed: HD03C/HD03C Payor Info: Payor: Advertising copywriter MEDICARE / Plan: Sandford Croon DUAL COMPLETE / Product Type: *No Product type* /    Admit Date/Time:  05/30/2023  2:11 PM  Primary Diagnosis:  Spinal cord injury, cervical region, sequela [facility])  Hospital Problems: Principal Problem:   Spinal cord injury, cervical region, sequela (HCC) Active Problems:   Coping style affecting medical condition    Expected Discharge Date: Expected Discharge Date:  (pending)  Team Members Present: Physician leading conference: Dr. Celia Coles Social Worker Present: Adrianna Albee, LCSW Nurse Present: Jerene Monks, RN PT Present: Aundria Leech, PT OT Present: [name], OT     Current Status/Progress Goal Weekly Team Focus  Bowel/Bladder   Continent B/B LBM 5/13   Will retain B/B continence with normal pattern   Assist with toilet needs qshift/prn    Swallow/Nutrition/ Hydration               ADL's   Min LB dressing, SBA UB dressing, able to self feed, Mod A stand pivot transfers   Min A overall   ADL retraining/transfers    Mobility   STS with min from elevated surfaces, min SPT and gait up to 160 ft with min a, mod i PWC   S bed mobility, CGA transfers bed <> w/c, mod A sit < > stands, upgraded gait goal to min A, mod I PWC mobility  progressing gait, LE strength, sit<>stand from lower surfaces    Communication                Safety/Cognition/ Behavioral Observations               Pain   Denies pain at this time   Will be free from pain   Assess pain qshift/prn    Skin   Surgical incision to anterior neck. Right forearm fistula and scabbed at posterior head   Will maintain skin intergrity with no s/s of infection  Assess skin  qshiftprn for s/s of infection and provide education to prevent breakdown      Discharge Planning:  Family is not able to get 24/7 care and pt is currently living on the third floor and he is a HD pt. Will need to pursue SNF for the short term rehab    Team Discussion: Patient was admitted post Corpectomy with traumatic C4 SCI and on Hemodialysis..Patient limited by left knee pain and  swelling.  Patient on target to meet rehab goals: yes, Patient requires SBA with upper body care and minimal assistance with lower body care. Patient requires mod assistance stand pivot with transfers. Patient able to ambulate up to 160' with minimal assistance / wheelchair follow and requires mod I assistance using a  power wheelchair.  *See Care Plan and progress notes for long and short-term goals.   Revisions to Treatment Plan:  Rt knee tap/steroid injection Power wheelchair Slide board Family Conference Resting hand splint Built up handles for feeding and grooming C-Collar Head CT Dopplers W/C evaluation Ortho consult  Teaching Needs: Safety, medications, dietary modifications, safety, toileting and transfers, etc     Current Barriers to Discharge: Decreased caregiver support, Home enviroment access/layout, and Hemodialysis  Possible Resolutions to Barriers: Family Education SNF  Medical Summary Current Status: Anemia of chornic disease- Hb up to 8.1; Doesn't urinate- Bowels continent- HD M/W/F- K+ 5.2  Barriers to Discharge: Behavior/Mood;Medical stability;Self-care education;Spasticity;Weight bearing restrictions;Symptomatic Anemia;Hypotension;Electrolyte abnormality  Barriers to Discharge Comments: limited by not being able ot go home- on 3rd floor apt- trying to get 1st floor apt- - improving, but still incomplete tetraplegia- Possible Resolutions to [employer] Focus: will upgrade goals-walked with regular RW 160 with w/c follow- min A- since R knee tap/steroid injection- d/c  SNF so far   Continued Need for Acute Rehabilitation Level of Care: The patient requires daily medical management by a physician with specialized training in physical medicine and rehabilitation for the following reasons: Direction of a multidisciplinary physical rehabilitation program to maximize functional independence : Yes Medical management of patient stability for increased activity during participation in an intensive rehabilitation regime.: Yes Analysis of laboratory values and/or radiology reports with any subsequent need for medication adjustment and/or medical intervention. : Yes   I attest that I was present, lead the team conference, and concur with the assessment and plan of the team.   [name] 06/25/2023, 1151 am

## 2023-06-26 NOTE — Progress Notes (Signed)
 Physical Therapy Session Note  Patient Details  Name: Carl Spuhler Sr. MRN: [id number] Date of Birth: 06/28/1960  Today's Date: 06/25/2023 PT Individual Time: 1431- 1510 PT Individual Time Calculation (min): 39 min  Short Term Goals: Week 1:  PT Short Term Goal 1 (Week 1): pt will perform STS with RW with assist of 1 PT Short Term Goal 1 - Progress (Week 1): Not met PT Short Term Goal 2 (Week 1): Pt will perform stand pivot with LRAD with assist of 1 PT Short Term Goal 2 - Progress (Week 1): Not met PT Short Term Goal 3 (Week 1): pt will initiate gait training PT Short Term Goal 3 - Progress (Week 1): Not met Week 2:  PT Short Term Goal 1 (Week 2): Pt will progress transfers to mod assist PT Short Term Goal 1 - Progress (Week 2): Met PT Short Term Goal 2 (Week 2): Pt will tolerate standing with lift equipment x 5 min preparation for gait PT Short Term Goal 2 - Progress (Week 2): Met PT Short Term Goal 3 (Week 2): Pt will be able to perform bed mobilty with min assist for supine <> sit PT Short Term Goal 3 - Progress (Week 2): Progressing toward goal Week 3:  PT Short Term Goal 1 (Week 3): Pt will perform bed mobility with min assist PT Short Term Goal 1 - Progress (Week 3): Met PT Short Term Goal 2 (Week 3): Pt will perform sit <> stands with mod A PT Short Term Goal 2 - Progress (Week 3): Progressing toward goal PT Short Term Goal 3 (Week 3): Pt will be able to gait x 150' with mod assist PT Short Term Goal 3 - Progress (Week 3): Progressing toward goal PT Short Term Goal 4 (Week 3): Pt will be able to independently perform and verbalize pressure relief techniques with PWC PT Short Term Goal 4 - Progress (Week 3): Met  Skilled Therapeutic Interventions/Progress Updates:  Patient seated upright in PWC on entrance to room. Pt is unhappy and relates struggles with emotions d/t family dynamics. Patient alert and agreeable to PT session with request to go outside. Not raining currently.    Patient with no pain complaint at start of session.  Therapeutic Activity: Pt provided with therapeutic listening and provision of comfort as pt relates worry for son who is assisting pt as much as possible while still working to help support family including pt. Pt also relates hx of wife's passing on same day as pt entered hospital at start of his medical turn. Pt appreciative of support and guidance through feelings of other members of family.   Wheelchair Mobility:  Pt propelled wheelchair over community distances with good mgmt of mobility up to 1. outside. Maneuvers all obstacles with minimal cueing needed when tuning in space of elevator and with attn needed to clear smaller obstacles/ steer away from borders/ curbs. Manages personal weight/ seated balance in seat with changes in speed well. Demos safety awareness with slowing speed and attn to surroundings when around numerous people. One instance requiring multimodal cue to steer around metal detector on way back into [facility] on request from security guard who pt had already passed.    Patient seated upright in PWC at end of session with brakes locked, belt alarm set, and all needs within reach.   Therapy Documentation Precautions:  Precautions Precautions: Fall, Cervical, Other (comment) (HD MWF) Recall of Precautions/Restrictions: Impaired Precaution/Restrictions Comments: Verbally reviewed cervical precautions. Required Braces or Orthoses: Cervical Brace  Cervical Brace: Soft collar, At all times Restrictions Weight Bearing Restrictions Per Provider Order: No  Pain: No pain related this session.    Therapy/Group: Individual Therapy  Donne Gage PT, DPT, CSRS 06/25/2023, 6:48 PM

## 2023-06-26 NOTE — Progress Notes (Signed)
 Occupational Therapy Session Note  Patient Details  Name: Carl Coit Sr. MRN: [id number] Date of Birth: 1960-05-26  Today's Date: 06/26/2023 OT Individual Time: 3086-5784 OT Individual Time Calculation (min): 70 min    Short Term Goals: Week 4:  OT Short Term Goal 1 (Week 4): Pt will initiate completing 9HPT for demonstration of increased BUE abilities OT Short Term Goal 2 (Week 4): Pt will complete UB dressing with Min A OT Short Term Goal 3 (Week 4): Pt will complete sit to stand at Mod A consistently throughout therapy sessions  Skilled Therapeutic Interventions/Progress Updates:    Pt sitting in PWC upon arrival. Pt declined shower this morning because he didin't have his soaps. Skilled OT services with focus on sit<>stand, standing balance, and functional amb with RW. Sit<>stand from EOM at 27" (min A) and 25" (mod A) x5. Stnading activities at card board with CGA. Amb with RW 10' x 2 with CGA. Pt returned to room and remained in [facility] with all needs within rach.   Therapy Documentation Precautions:  Precautions Precautions: Fall, Cervical, Other (comment) (HD MWF) Recall of Precautions/Restrictions: Impaired Precaution/Restrictions Comments: Verbally reviewed cervical precautions. Required Braces or Orthoses: Cervical Brace Cervical Brace: Soft collar, At all times Restrictions Weight Bearing Restrictions Per Provider Order: No   Pain: Pt denies pain this morning   Therapy/Group: Individual Therapy  [name] 06/26/2023, 12:04 PM

## 2023-06-26 NOTE — Progress Notes (Signed)
 Physical Therapy Session Note  Patient Details  Name: Carl Gilleland Sr. MRN: [id number] Date of Birth: 10/23/1960  Today's Date: 06/26/2023 PT Individual Time: 4098-1191 PT Individual Time Calculation (min): 60 min   Short Term Goals: Week 4:  PT Short Term Goal 1 (Week 4): Pt will consistantly be able to perform sit <> stands with max A PT Short Term Goal 2 (Week 4): Pt will be able to perform gait x 100' with mod A PT Short Term Goal 3 (Week 4): Pt will be able to perform bed mobility with supervision  Skilled Therapeutic Interventions/Progress Updates: Pt presented in PWC agreeable to therapy. Pt states pain well managed at the moment. Rest breaks provided during session as needed for intermittent knee pain. Pt navigated PWC to day room mod I. Pt stood with modA and ambulated to high/low mat with minA. At mat pt participated in standing balance UE activities including reaching and placing clothespins on basketball net with 1.5lb weights on B wrists. Pt incorporated Sit to stand with reaching tasks  taking intermittent sitting breaks as needed. Pt initially attempting to try to stand from 25in mat but raised to 27in with improved efficacy. PTA noted that pt's PWC seat at 29in. Pt also participated in core activities performing ab twists holding 4lb weighted ball x 20 and modified crunches with 4lb weighted ball x 20. Pt then completed ambulatory transfer back to [facility] ~6ft with pt requiring modA to stand and minA for ambulation. Pt sis require increased time to complete turn to PWC. Pt was mod I with PWC management including placing leg rests. Pt navigated back to room mod I and remained in PWC with call bell within reach and current needs met.      Therapy Documentation Precautions:  Precautions Precautions: Fall, Cervical, Other (comment) (HD MWF) Recall of Precautions/Restrictions: Impaired Precaution/Restrictions Comments: Verbally reviewed cervical precautions. Required Braces or  Orthoses: Cervical Brace Cervical Brace: Soft collar, At all times Restrictions Weight Bearing Restrictions Per Provider Order: No General:   Vital Signs: Therapy Vitals Temp: 98.5 F (36.9 C) Temp Source: Oral Pulse Rate: 88 Resp: 16 BP: 136/89 Patient Position (if appropriate): Lying Oxygen Therapy SpO2: 100 % O2 Device: Room Air   Therapy/Group: Individual Therapy  Seith Aikey 06/26/2023, 3:45 PM

## 2023-06-26 NOTE — Progress Notes (Signed)
 PROGRESS NOTE   Subjective/Complaints:  Pt asking about labs last night that were ordered- done after 9pm- K_ came down to 5.7 went over with pt.  Occ HA and hands also hurt occasionally, however R knee doing great.   Was able to do 1 step mod A of 2 with therapy- but has to do 18 steps at home- up to 3rd floor apt- cannot get into 1st floor apt til 08/13/23.     ROS:    Pt denies SOB, abd pain, CP, N/V/C/D, and vision changes   B/L R>L knee pain (+)- R knee very improved  Objective:   No results found.     Recent Labs    06/24/23 1535  WBC 7.7  HGB 8.1*  HCT 25.5*  PLT 382    Recent Labs    06/24/23 1536 06/25/23 2107  NA 132* 133*  K 6.2* 5.7*  CL 94* 94*  CO2 22 26  GLUCOSE 114* 109*  BUN 87* 69*  CREATININE 8.84* 8.18*  CALCIUM  8.6* 8.5*     Intake/Output Summary (Last 24 hours) at 06/26/2023 1038 Last data filed at 06/25/2023 1915 Gross per 24 hour  Intake 472 ml  Output --  Net 472 ml        Physical Exam: Vital Signs Blood pressure 128/71, pulse 88, temperature 98.5 F (36.9 C), temperature source Oral, resp. rate 18, height 6\' 1"  (1.854 m), weight 94 kg, SpO2 99%.       General: awake, alert, appropriate, sitting at sink in power w/c shaving; took longer than prior to SCI, but doing well; NAD HENT: conjugate gaze; oropharynx moist CV: regular rate and rhythm; no JVD Pulmonary: CTA B/L; no W/R/R- good air movement GI: soft, NT, ND, (+)BS Psychiatric: appropriate- jovial today Neurological: Ox3   Neurological: Ox3, follows commands, cranial nerves II through XII grossly intact,   PRIOR EXAMS: MSK- R knee much less swollen MsK: B/L knees with effusions- less TTP on R knee this AM- looks good- mild effusion , no erythema, not really focally warm to touch- Both have effusions, - R hand swelling looks good/resolved  MSK- R hand swelling esp ulnar side of hand/dorsum and L  wrist-stable this AM RUE- Biceps 4/5; WE 4-/5; Triceps 2/5; Grip 2-/5 and FA 1/5 LUE- 4/5 except triceps 3+ to 4-/5 RLE- HF 1 to 2-/5; KE 2-/5; DF/PF 2-/5 LLE- HF 3/5; KE 4-/5; DF/PF 4/5    Skin: C/D/I. Multiple lesions on BL legs with various stages of healing.  + RUE fistula - intact   MSK:      LLE knee with palpable effusion, no warmth, no ttp Mood: Pleasant affect, appropriate mood.  Neurologic exam: Alert and oriented x 4, fluent, follows commands, cranial nerves II through XII grossly intact, no hypertonia noted  Sensation: Reduced to light touch in distal right lower extremity to the mid-thigh       Strength:                RUE: 2/5 SA, 2/5 EF, 2/5 EE, 2/5 WE, 2/5 FF, 2/5 FA                LUE:  3/5  SA, 3/5 EF, 3/5 EE, 3/5 WE, 3/5 FF, 3/5 FA                RLE: 2/5 HF, 2/5 KE, 2/5  DF, 2/5  EHL, 2/5  PF                 LLE:  3/5 HF, 4/5 KE, 4/5  DF, 4/5  EHL, 4/5  PF  Neuroexam stable 4/21  Assessment/Plan: 1. Functional deficits which require 3+ hours per day of interdisciplinary therapy in a comprehensive inpatient rehab setting. Physiatrist is providing close team supervision and 24 hour management of active medical problems listed below. Physiatrist and rehab team continue to assess barriers to discharge/monitor patient progress toward functional and medical goals  Care Tool:  Bathing    Body parts bathed by patient: Right arm, Chest, Abdomen, Right upper leg, Left upper leg, Face   Body parts bathed by helper: Left arm, Front perineal area, Buttocks, Right lower leg, Left lower leg     Bathing assist Assist Level: Moderate Assistance - Patient 50 - 74%     Upper Body Dressing/Undressing Upper body dressing   What is the patient wearing?: Pull over shirt    Upper body assist Assist Level: Minimal Assistance - Patient > 75%    Lower Body Dressing/Undressing Lower body dressing      What is the patient wearing?: Pants     Lower body assist Assist for  lower body dressing: Moderate Assistance - Patient 50 - 74%     Toileting Toileting Toileting Activity did not occur (Clothing management and hygiene only): N/A (no void or bm) (anuric, HD patient)  Toileting assist Assist for toileting: Maximal Assistance - Patient 25 - 49%     Transfers Chair/bed transfer  Transfers assist     Chair/bed transfer assist level: Moderate Assistance - Patient 50 - 74% (w/FWW stand pivot x1 a.)     Locomotion Ambulation   Ambulation assist   Ambulation activity did not occur: Safety/medical concerns  Assist level: Minimal Assistance - Patient > 75% Assistive device: Walker-rolling Max distance: 92   Walk 10 feet activity   Assist  Walk 10 feet activity did not occur: Safety/medical concerns  Assist level: Minimal Assistance - Patient > 75% Assistive device: Walker-rolling   Walk 50 feet activity   Assist Walk 50 feet with 2 turns activity did not occur: Safety/medical concerns  Assist level: Minimal Assistance - Patient > 75% Assistive device: Walker-rolling    Walk 150 feet activity   Assist Walk 150 feet activity did not occur: Safety/medical concerns  Assist level: 2 helpers      Walk 10 feet on uneven surface  activity   Assist Walk 10 feet on uneven surfaces activity did not occur: Safety/medical concerns         Wheelchair     Assist Is the patient using a wheelchair?: Yes Type of Wheelchair: Power    Wheelchair assist level: Independent Max wheelchair distance: 300    Wheelchair 50 feet with 2 turns activity    Assist        Assist Level: Independent   Wheelchair 150 feet activity     Assist      Assist Level: Independent   Blood pressure 128/71, pulse 88, temperature 98.5 F (36.9 C), temperature source Oral, resp. rate 18, height 6\' 1"  (1.854 m), weight 94 kg, SpO2 99%.  Medical Problem List and Plan: 1. 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  Antithrombotics: -DVT/anticoagulation:  Mechanical: Sequential compression devices, below knee Bilateral lower extremities             -antiplatelet therapy: N/A--no antiplatelet due to hx of recurrent GIB  4/25- d/w family- they agree that Lovenox  not appropriate at this time.   -06/09/23 ordered dopplers to r/o clots given low grade temps recently  4/28- LE Dopplers (-) for DVT 3. Pain Management:  oxycodone  prn. Gabapentin  300 mg/hs.              --has Flexeril  10 mg TID prn              - patient with intermittent, severe b/l headache - add topomax 25 mg BID    4/19- pt rating pain 07/19/08 which is "tolerable" 5/7- got Ortho to come and take fluid off R knee- got 100cc off- pain is no more than 1/10 this AM! 5/8-5/9 knee still doing well 5/13-5/14-  pain controlled- allowed him to walk further yesterday in therapy- still cannot do more than 1 step 4. Mood/Behavior/Sleep: LCSW to follow for evaluation and support             -antipsychotic agents: N/A             --melatonin prn for insomnia.  -06/09/23 poor sleep, d/t interruptions, asked nursing to minimize interruptions between 11p-5a (apparently they can't put a sign up, but will communicate in huddle) 5. Neuropsych/cognition: This patient is capable of making decisions on his own behalf. 5/8- Ox3 and appropriate 6. Skin/Wound Care: Routine pressure relief measures.   4/19- due to ulcers on legs, on low air loss mattress 7. Fluids/Electrolytes/Nutrition: Strict I/O. Daily weights. Labs with HD. 8. ESRD- HD MWF: Schedule HD at the end of the day  to help with tolerance of therapy             --Sensipar  and Renvela  for metabolic bone disease.   - Patient reports no longer makes urine  5/13 - last HD yesterday 9. Anemia of chronic disease: Monitor H/H, aranesp  5/1- Hb down to 7.3- is Jehovah's Witness, so doesn't want transfusions? 5/6- Hb still 7.3- isn't dropping more- No blood or dark stools per pt- no signs of GI loss. 5/7- pt refused labs this AM- wants to get in HD  5/8- Hb 7.1 yesterday after HD- did explained to pt like to get labs before HD so can act on them- will d/w pt again about transfusion? 5/9- Hb 7.6 in last 24 hours- done yesterday- feels better- no dizziness or fatigue- so doesn't feel transfusion is appropriate 5/12 HGB up to 8.1 , improved continue to monitor.  He is on Aranesp  per nephrology  10. H/o recurrent GIB/Jehovah's Witness: Continue Protonix  BID 11. H/o Hypotension: Has had drop  in BP with HD. Now cervical SCI.  --Monitor for symptoms day after HD and with activity. -4/18 baseline blood pressure stable, monitor with activity 4/19-  BP actually slightly elevated today- 140s-150's- con't regimen, since can drop with HD and therapy 4/20- BP actually doing better today- in 100s'110s- however HR elevated, likely to fluid overload 4/21 nephrology started midodrine  10mg  before dialysis, BP appears to be doing a little better today 5/8- BP on low side in last 24 hours- likely made worse due to HD yesterday- denies OH Sx's. Con't to monitor trend 5/9- BP is running in 90's systolic- doesn't feel dizzy or lightheaded sitting, but could drop when standing-will d/w therapy -5/12 BP was a little elevated earlier today but appears to be improved, continue to monitor.  He has dialysis today 5/13-5/14 BP running normal to very slightly elevated, which is good, since had HD yesterday Vitals:   06/24/23 1630 06/24/23 1700 06/24/23 1730 06/24/23 1800  BP: 105/73 138/81 121/72 121/75   06/24/23 1830 06/24/23 1842 06/24/23  1849 06/24/23 1936  BP: 113/74 121/68 122/83 (!) 140/85   06/25/23 0430 06/25/23 1432 06/25/23 2040 06/26/23 0422  BP: 127/85 133/81 (!) 144/78 128/71     12. Constipation: Small BM on 04/14 --had of nulytey yesterday and another today? -Xray 4/17 without excessive stool, patient reports stool still little hard yesterday.  Will increase MiraLAX  to 3 times a day, continue Senokot twice a day. Suppository PRN   06/09/23 LBM yesterday, cont regimen 4/28- LBM overnight- has been refusing miralax - made it prn- taking metamucil  5/13- LBM this AM 13. ?left knee effusion. Not currently bothersome. Consider imaging.  14.  SIRS- Fever/hypotension/tachycardia - 4/21 Currently afebrile, he got to 50 cc IVF yesterday, CXR negative, EKG sinus tachycardia. Patient was started on Maxipime  and vancomycin  yesterday.  Appears to be doing much better today.  WBC not elevated at 7.1.  Continue to follow blood cultures 4/22- Blood Cx's (-) so far- WBC 7.3- will con't IV ABX until know final results of blood Cx's- CXR (-)- but temp was 103.2 yesterday overnight.  4/23- Blood C'x (-) x2 days so far- T 99.9 this Am after a few sips of coffee- was 100.4 this AM- will con't IV ABX since concerned about possible bacteremia- esp since having low grade temps still 4/24- No more fevers, even low grade- no growth on blood Cx' bu it says that didn't get enough blood, so much be falsely negative 4/25- No growth for 5 days, but it also said not enough blood to be sure was a true negative. Will change stop date til tomorrow -06/08/23 at 3am temp recorded as 100.3 but 5am was 98.3; will get CBC w/diff, monitor temps closely today, if continued low grade temps, will reach out to ID. BCx NGTD x5d. Didn't get vanc yesterday? Or cefepime ? Unclear why. Cefepime  started this morning at 0011. -06/09/23 temp 100.3 at 3am yesterday and then 100 later in the day, but no fevers since, no tachycardia.  -WBC yesterday 8.9.  -Now with some  confusion; spoke with Dr. Levern Reader of ID, stated cefepime  could be the cause of the confusion -recommended we stop all abx (which ended yesterday anyway) -watch the patient OFF abx -if fevers >100.3 occur then she recommended repeat BCx and CXR and could call her back to see if any abx would be advised at that time.  -Head CT and dopplers ordered as well, as above 4/28- CT looks good- no fever, WBC is OK- off ABX 4/29- Also Dopplers (-) feeling good this AM 06/16/23 seems to have resolved. 15. Tachycardia 4/22- doing better- right at 100 this AM- will monitor- likely  can be from dregs of infection vs OH. -will monitor  4/23- Tachycardia improving- 4/24-27 HR in 90's right now- doing better  4/29- 4/30- HR running in 90s-low 100's today 5/12 heart rate in the 70s and 80s primarily, continue to monitor    06/26/2023    4:22 AM 06/26/2023    4:18 AM 06/26/2023    4:00 AM  Vitals with BMI  Weight  207 lbs 4 oz 207 lbs 5 oz  BMI  27.35 27.36  Systolic 128    Diastolic 71    Pulse 88      16. R hand swelling  4/23- doing ice- will do xrays 4/24- Xray doesn't look like cause of swelling, however could be due to new shunt? 4/30- resolved 17. Hypocalcemia 4/24- his Albumin  is 2.1, so probably ok, and phos coming down- will leave to renal- thank you for their help  5/1- Ca 9.3 5/8- Ca 8.8 18. Hyperkalemia 4/28- K+ 6.3 today- got hypoosmotic bath to reduce K+ per note from RN/HD nurse? Also has Na of 127- being addressed by HD/renal- Phos also 5.8- but again, getting HD today 5/1- K+ down to 5.6 and Na 129 5/6- Na 133 and K+ down to 4.7 from 5.6 5/7- refused labs this AM- wants in HD 5/9- K_ came back as 4.5 5/12 potassium elevated today, chronic issue has dialysis today.  Treatment per renal team 5/14- K+ down to 5.7- from 6.2- has HD today 19. R>L knee pain 4/29- will ask Ortho if they will tap his R knee- I have no problem doing injection of knee, however with so much fluid, I would  rather Ortho tap him and then do steroid injection.   4/30- we added Prednisone  40 mg daily x 3 days- to see if helps knee pain  5/1- mild reduction in pain so far 5/6- Pt has short term improvement from Prednisone  dosing for knees- con't percocet prn 5/7- since pt was doing so much worse with therapy, asked Ortho to come and tap knee and take off fluid- do steroid injection- they drew off 100CC- 5 different draws required- fluid is pending, but has a lot of WBCs- no organisms on gram stain.   5/8- no growth for 24 hours- R knee pain so much better  -06/22/23 NGTD x3 days, monitor  5/14- No growth  - pain so much better after 100cc fluid removed and got injection   I spent a total of  35  minutes on total care today- >50% coordination of care- due to  D/w pt and nursing about pain control; went over labs and vitals- and d/w pt about dispo and time needed    LOS: 27 days A FACE TO FACE EVALUATION WAS PERFORMED  Taegan Haider 06/26/2023, 10:38 AM

## 2023-06-26 NOTE — Progress Notes (Signed)
 Occupational Therapy Session Note  Patient Details  Name: Carl Ensor Sr. MRN: [id number] Date of Birth: 04-Jan-1961  Today's Date: 06/26/2023 OT Individual Time: 0800-0900 OT Individual Time Calculation (min): 60 min    Short Term Goals: Week 4:  OT Short Term Goal 1 (Week 4): Pt will initiate completing 9HPT for demonstration of increased BUE abilities OT Short Term Goal 2 (Week 4): Pt will complete UB dressing with Min A OT Short Term Goal 3 (Week 4): Pt will complete sit to stand at Mod A consistently throughout therapy sessions  Skilled Therapeutic Interventions/Progress Updates:      Therapy Documentation Precautions:  Precautions Precautions: Fall, Cervical, Other (comment) (HD MWF) Recall of Precautions/Restrictions: Impaired Precaution/Restrictions Comments: Verbally reviewed cervical precautions. Required Braces or Orthoses: Cervical Brace Cervical Brace: Soft collar, At all times Restrictions Weight Bearing Restrictions Per Provider Order: No General: "Save me some punch from [facility]!" Pt seated in W/C upon OT arrival, agreeable to OT.  Pain: no pain reported  Balance: OT provided skilled intervention with transfer training for increased independence with task and overall BLE strength/endurance. Pt complete 5x step pivot transfers from W/C><mat table, pt able to complete sit to stand from W/C at Min A and Mod A from lower mat table. Pt CGA with step pivot with RW.  Exercises: Pt participated in [facility] activity with resistive clothespins, pt retrieving items  with BUE, increased difficulty with black pins (most resistive) with RUE and using LUE to assist, able to complete up to blue without assistance with RUE. Pt placing them onto clothes lines  for increased dexterity and coordination in order to complete ADLs such as buttoning shirts. Pt completed tasks with PRN rest breaks.   Pt seated in W/C at end of session with W/C alarm donned, call light within reach and 4Ps  assessed.    Therapy/Group: Individual Therapy  [name], OTD, OTR/L 06/26/2023, 8:51 AM

## 2023-06-26 NOTE — Progress Notes (Signed)
 [facility] Progress Note   Subjective:    Patient seen in his room while he was about to enjoy some lunch. His son is visiting with him today. Patient has no specific complaints. He denies any dyspnea or CP today. He does have LE edema bilaterally. Plan for HD today.    Objective Vitals:   06/25/23 2040 06/26/23 0400 06/26/23 0418 06/26/23 0422  BP: (!) 144/78   128/71  Pulse: 95   88  Resp: 20   18  Temp: 99.1 F (37.3 C)   98.5 F (36.9 C)  TempSrc: Oral   Oral  SpO2: 100%   99%  Weight:  94 kg 94 kg   Height:       Physical Exam General: Appears well, nad , room air Heart: RRR Lungs: Clear throughout  Abdomen: Soft, non-distended Extremities: trace edema, wearing compression stockings Dialysis Access:  RUE AVF +bruit   Filed Weights   06/25/23 0400 06/26/23 0400 06/26/23 0418  Weight: 94.4 kg 94 kg 94 kg    Intake/Output Summary (Last 24 hours) at 06/26/2023 1332 Last data filed at 06/25/2023 1915 Gross per 24 hour  Intake 472 ml  Output --  Net 472 ml    Additional Objective Labs: Basic Metabolic Panel: Recent Labs  Lab 06/20/23 1037 06/21/23 1400 06/24/23 1536 06/25/23 2107  NA 132* 131* 132* 133*  K 4.5 5.3* 6.2* 5.7*  CL 93* 91* 94* 94*  CO2 24 23 22 26   GLUCOSE 110* 110* 114* 109*  BUN 61* 82* 87* 69*  CREATININE 6.13* 7.87* 8.84* 8.18*  CALCIUM  8.5* 8.6* 8.6* 8.5*  PHOS 3.8 4.5 4.5  --    Liver Function Tests: Recent Labs  Lab 06/20/23 1037 06/21/23 1400 06/24/23 1536  ALBUMIN  2.2* 2.3* 2.4*   No results for input(s): "LIPASE", "AMYLASE" in the last 168 hours. CBC: Recent Labs  Lab 06/19/23 1505 06/20/23 1037 06/21/23 1400 06/24/23 1535  WBC 7.1 4.9 7.2 7.7  NEUTROABS 5.5  --   --  5.2  HGB 7.1* 7.6* 7.6* 8.1*  HCT 22.2* 24.4* 24.2* 25.5*  MCV 91.4 90.7 89.6 89.8  PLT 371 345 419* 382   Blood Culture    Component Value Date/Time   SDES FLUID 06/18/2023 1357   SPECREQUEST NONE 06/18/2023 1357   CULT   06/18/2023 1357    NO GROWTH 3 DAYS Performed at [facility], [address], [place], [place] [address]    REPTSTATUS 06/22/2023 FINAL 06/18/2023 1357    Cardiac Enzymes: No results for input(s): "CKTOTAL", "CKMB", "CKMBINDEX", "TROPONINI" in the last 168 hours. CBG: No results for input(s): "GLUCAP" in the last 168 hours. Iron Studies: No results for input(s): "IRON", "TIBC", "TRANSFERRIN", "FERRITIN" in the last 72 hours. Lab Results  Component Value Date   INR 1.1 05/27/2023   INR 1.1 05/25/2023   INR 1.1 09/17/2021   Studies/Results: No results found.  Medications:    (feeding supplement) PROSource Plus  30 mL Oral BID BM   atorvastatin   10 mg Oral Q Thu   carbamide peroxide  5 drop Right EAR BID   Chlorhexidine  Gluconate Cloth  6 each Topical Q0600   Chlorhexidine  Gluconate Cloth  6 each Topical Q0600   cinacalcet   120 mg Oral Q supper   darbepoetin (ARANESP ) injection - DIALYSIS  150 mcg Subcutaneous Q Tue-1800   gabapentin   300 mg Oral QHS   Gerhardt's butt cream   Topical BID   pantoprazole   40 mg Oral  BID AC   psyllium  1 packet Oral TID   senna-docusate  2 tablet Oral BID   sevelamer  carbonate  3,200 mg Oral TID WC   sodium zirconium cyclosilicate   10 g Oral QODAY   sorbitol   30 mL Oral Once   topiramate   25 mg Oral BID    Dialysis Orders: OP Dialysis Orders [facility] - MWF 4h  B400   85.5kg   2K bath  AVF   - Heparin  3000 units IV three times per week  - Mircera 60mcg IV q 2 weeks- last dose 05/24/23  - Calcitriol  1.5mcg PO q HD  Assessment/Plan: 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 Next HD 5/14.  Hyperkalemia: K+ 5.7 on 06/26/23. Chronic issue.  On Lokelma  daily now. Monitor trend. Needs updated electrolytes. I ordered updated bmet to check potassium.  HTN/volume: BP controlled/stable, still above EDW with some edema on exam, push UFG as tolerated Anemia of ESRD: Hgb 8.1, Arranesp  increased to 150 q Tues -last 5/13.  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 Secondary HPTH: CorrCa 10, calcitriol  on hold for now/Phos now at goal, continue sevelamer  and sensipar  Nutrition: Alb 2.4, continue protein supplements. Patient reports appetite is better but states that the food is subpar.   Hersey Lorenzo, NP [facility] 06/26/2023,1:32 PM

## 2023-06-27 MED ORDER — CHLORHEXIDINE GLUCONATE CLOTH 2 % EX PADS
6.0000 | MEDICATED_PAD | Freq: Every day | CUTANEOUS | Status: DC
  Administered 2023-06-30 – 2023-07-01 (×2): 6 via TOPICAL

## 2023-06-27 NOTE — Progress Notes (Signed)
 Occupational Therapy Session Note  Patient Details  Name: Carl Stallcup Sr. MRN: [id number] Date of Birth: December 12, 1960  Today's Date: 06/27/2023 OT Individual Time: 1300-1330 OT Individual Time Calculation (min): 30 min    Short Term Goals: Week 4:  OT Short Term Goal 1 (Week 4): Pt will initiate completing 9HPT for demonstration of increased BUE abilities OT Short Term Goal 2 (Week 4): Pt will complete UB dressing with Min A OT Short Term Goal 3 (Week 4): Pt will complete sit to stand at Mod A consistently throughout therapy sessions  Skilled Therapeutic Interventions/Progress Updates:      Therapy Documentation Precautions:  Precautions Precautions: Fall, Cervical, Other (comment) (HD MWF) Recall of Precautions/Restrictions: Impaired Precaution/Restrictions Comments: Verbally reviewed cervical precautions. Required Braces or Orthoses: Cervical Brace Cervical Brace: Soft collar, At all times Restrictions Weight Bearing Restrictions Per Provider Order: No General: "[name]" Pt seated in W/C upon OT arrival, agreeable to OT.  Pain: no pain reported  ADL: OT providing skilled intervention with IADL tasking such as light housekeeping. Pt able to reach out of BOS while seated in chair in order to retrieve/place clothing in drawers, also using dual tasking strategies, using BUE to fold with SBA and increased ability/independence to use BUE for ADL tasks. Pt exhibiting good trunk support when anterior reaching for retrieval of clothing and placing into drawers.    Pt seated in W/C at end of session with W/C alarm donned, call light within reach and 4Ps assessed.     Therapy/Group: Individual Therapy  [name], OTD, OTR/L 06/27/2023, 1:40 PM

## 2023-06-27 NOTE — Progress Notes (Signed)
 PROGRESS NOTE   Subjective/Complaints:  LBM yesterday-  feeling great- has shower today.   Said didn't get all HD yesterday due to equipment failure- but feeling good otherwise.    ROS:    Pt denies SOB, abd pain, CP, N/V/C/D, and vision changes   B/L R>L knee pain (+)- R knee very improved  Objective:   No results found.     Recent Labs    06/24/23 1535  WBC 7.7  HGB 8.1*  HCT 25.5*  PLT 382    Recent Labs    06/25/23 2107 06/26/23 1558  NA 133* 132*  K 5.7* 5.5*  CL 94* 94*  CO2 26 23  GLUCOSE 109* 112*  BUN 69* 85*  CREATININE 8.18* 9.17*  CALCIUM  8.5* 8.4*     Intake/Output Summary (Last 24 hours) at 06/27/2023 0823 Last data filed at 06/26/2023 1849 Gross per 24 hour  Intake 237 ml  Output 3.4 ml  Net 233.6 ml        Physical Exam: Vital Signs Blood pressure 124/77, pulse 89, temperature 99 F (37.2 C), temperature source Oral, resp. rate 18, height 6\' 1"  (1.854 m), weight (P) 91.4 kg, SpO2 100%.        General: awake, alert, appropriate, sitting up on EOB- trying to get something off floor! Got for pt; NAD HENT: conjugate gaze; oropharynx moist CV: regular rate and rhythm; no JVD Pulmonary: CTA B/L; no W/R/R- good air movement GI: soft, NT, ND, (+)BS Psychiatric: appropriate- decreased safety awareness- trying to get something on floor Neurological: Ox3  Neurological: Ox3, follows commands, cranial nerves II through XII grossly intact,   PRIOR EXAMS: MSK- R knee much less swollen MsK: B/L knees with effusions- less TTP on R knee this AM- looks good- mild effusion , no erythema, not really focally warm to touch- Both have effusions, - R hand swelling looks good/resolved  MSK- R hand swelling esp ulnar side of hand/dorsum and L wrist-stable this AM RUE- Biceps 4/5; WE 4-/5; Triceps 2/5; Grip 2-/5 and FA 1/5 LUE- 4/5 except triceps 3+ to 4-/5 RLE- HF 1 to 2-/5; KE 2-/5;  DF/PF 2-/5 LLE- HF 3/5; KE 4-/5; DF/PF 4/5    Skin: C/D/I. Multiple lesions on BL legs with various stages of healing.  + RUE fistula - intact   MSK:      LLE knee with palpable effusion, no warmth, no ttp Mood: Pleasant affect, appropriate mood.  Neurologic exam: Alert and oriented x 4, fluent, follows commands, cranial nerves II through XII grossly intact, no hypertonia noted  Sensation: Reduced to light touch in distal right lower extremity to the mid-thigh       Strength:                RUE: 2/5 SA, 2/5 EF, 2/5 EE, 2/5 WE, 2/5 FF, 2/5 FA                LUE:  3/5 SA, 3/5 EF, 3/5 EE, 3/5 WE, 3/5 FF, 3/5 FA                RLE: 2/5 HF, 2/5 KE, 2/5  DF, 2/5  EHL, 2/5  PF  LLE:  3/5 HF, 4/5 KE, 4/5  DF, 4/5  EHL, 4/5  PF  Neuroexam stable 4/21  Assessment/Plan: 1. Functional deficits which require 3+ hours per day of interdisciplinary therapy in a comprehensive inpatient rehab setting. Physiatrist is providing close team supervision and 24 hour management of active medical problems listed below. Physiatrist and rehab team continue to assess barriers to discharge/monitor patient progress toward functional and medical goals  Care Tool:  Bathing    Body parts bathed by patient: Right arm, Chest, Abdomen, Right upper leg, Left upper leg, Face   Body parts bathed by helper: Left arm, Front perineal area, Buttocks, Right lower leg, Left lower leg     Bathing assist Assist Level: Moderate Assistance - Patient 50 - 74%     Upper Body Dressing/Undressing Upper body dressing   What is the patient wearing?: Pull over shirt    Upper body assist Assist Level: Minimal Assistance - Patient > 75%    Lower Body Dressing/Undressing Lower body dressing      What is the patient wearing?: Pants     Lower body assist Assist for lower body dressing: Moderate Assistance - Patient 50 - 74%     Toileting Toileting Toileting Activity did not occur (Clothing management and  hygiene only): N/A (no void or bm) (anuric, HD patient)  Toileting assist Assist for toileting: Maximal Assistance - Patient 25 - 49%     Transfers Chair/bed transfer  Transfers assist     Chair/bed transfer assist level: Moderate Assistance - Patient 50 - 74% (w/FWW stand pivot x1 a.)     Locomotion Ambulation   Ambulation assist   Ambulation activity did not occur: Safety/medical concerns  Assist level: Minimal Assistance - Patient > 75% Assistive device: Walker-rolling Max distance: 92   Walk 10 feet activity   Assist  Walk 10 feet activity did not occur: Safety/medical concerns  Assist level: Minimal Assistance - Patient > 75% Assistive device: Walker-rolling   Walk 50 feet activity   Assist Walk 50 feet with 2 turns activity did not occur: Safety/medical concerns  Assist level: Minimal Assistance - Patient > 75% Assistive device: Walker-rolling    Walk 150 feet activity   Assist Walk 150 feet activity did not occur: Safety/medical concerns  Assist level: 2 helpers      Walk 10 feet on uneven surface  activity   Assist Walk 10 feet on uneven surfaces activity did not occur: Safety/medical concerns         Wheelchair     Assist Is the patient using a wheelchair?: Yes Type of Wheelchair: Power    Wheelchair assist level: Independent Max wheelchair distance: 300    Wheelchair 50 feet with 2 turns activity    Assist        Assist Level: Independent   Wheelchair 150 feet activity     Assist      Assist Level: Independent   Blood pressure 124/77, pulse 89, temperature 99 F (37.2 C), temperature source Oral, resp. rate 18, height 6\' 1"  (1.854 m), weight (P) 91.4 kg, SpO2 100%.  Medical Problem List and Plan: 1. 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  Antithrombotics: -DVT/anticoagulation:  Mechanical: Sequential compression devices, below knee Bilateral lower extremities             -antiplatelet therapy: N/A--no antiplatelet due to hx of recurrent GIB  4/25- d/w family- they agree that Lovenox  not appropriate at this time.   -06/09/23 ordered dopplers to r/o clots given low grade temps recently  4/28- LE Dopplers (-) for DVT 3. Pain Management:  oxycodone  prn. Gabapentin  300 mg/hs.              --has Flexeril  10 mg TID prn              - patient with intermittent, severe b/l headache - add topomax 25 mg BID    4/19- pt rating pain 07/19/08 which is "tolerable" 5/7- got Ortho to come and take fluid off R knee- got 100cc off- pain is no more than 1/10 this AM! 5/8-5/9 knee still doing well 5/13-5/14-  pain controlled- allowed him to walk further yesterday in therapy- still cannot do more than 1 step 4. Mood/Behavior/Sleep: LCSW to follow for evaluation and support             -antipsychotic agents: N/A             --melatonin prn for insomnia.  -06/09/23 poor sleep, d/t interruptions, asked nursing to minimize interruptions between 11p-5a (apparently they can't put a sign up, but will communicate in huddle) 5. Neuropsych/cognition: This patient is capable of making decisions on his own behalf. 5/8- Ox3 and appropriate 6. Skin/Wound Care: Routine pressure relief measures.   4/19- due to ulcers on legs, on low air loss mattress 7. Fluids/Electrolytes/Nutrition: Strict I/O. Daily weights. Labs with HD. 8. ESRD- HD MWF: Schedule HD at the end of the day to help with tolerance of therapy             --Sensipar  and Renvela  for metabolic bone disease.   - Patient reports no longer makes  urine  5/13 - last HD yesterday  5/15- last HD yesterday=- they were not able to finish due to equipment issues- will need to monitor fluid intake today- so can make it til tomorrow- they are not doing more today.  9. Anemia of chronic disease: Monitor H/H, aranesp  5/1- Hb down to 7.3- is Jehovah's Witness, so doesn't want transfusions? 5/6- Hb still 7.3- isn't dropping more- No blood or dark stools per pt- no signs of GI loss. 5/7- pt refused labs this AM- wants to get in HD  5/8- Hb 7.1 yesterday after HD- did explained to pt like to get labs before HD so can act on them- will d/w pt again about transfusion? 5/9- Hb 7.6 in last 24 hours- done yesterday- feels better- no dizziness or fatigue- so doesn't feel transfusion is appropriate 5/12 HGB up to 8.1 , improved continue to monitor.  He is on Aranesp  per nephrology 5/15- pt needs to have CBC rechecked in HD tomorrow-  10. H/o recurrent GIB/Jehovah's Witness: Continue Protonix  BID 11. H/o Hypotension: Has had drop  in BP with HD. Now cervical SCI.  --Monitor for symptoms day after HD and with activity. -4/18 baseline blood pressure stable, monitor with activity 4/19- BP actually slightly elevated today- 140s-150's- con't regimen, since can drop with HD and therapy 4/20- BP actually doing better today-  in 100s'110s- however HR elevated, likely to fluid overload 4/21 nephrology started midodrine  10mg  before dialysis, BP appears to be doing a little better today 5/8- BP on low side in last 24 hours- likely made worse due to HD yesterday- denies OH Sx's. Con't to monitor trend 5/9- BP is running in 90's systolic- doesn't feel dizzy or lightheaded sitting, but could drop when standing-will d/w therapy -5/12 BP was a little elevated earlier today but appears to be improved, continue to monitor.  He has dialysis today 5/13-5/15 BP running normal to very slightly elevated, which is good, since on HD- con't regimen Vitals:   06/26/23 1527 06/26/23  1530 06/26/23 1600 06/26/23 1630  BP: (!) 146/74 (!) 140/62 134/88 (!) 142/84   06/26/23 1700 06/26/23 1730 06/26/23 1800 06/26/23 1830  BP: 123/77 139/85 131/86 132/88   06/26/23 1848 06/26/23 1849 06/26/23 1943 06/27/23 0411  BP: 110/88 (P) 110/66 113/70 124/77     12. Constipation: Small BM on 04/14 --had of nulytey yesterday and another today? -Xray 4/17 without excessive stool, patient reports stool still little hard yesterday.  Will increase MiraLAX  to 3 times a day, continue Senokot twice a day. Suppository PRN   06/09/23 LBM yesterday, cont regimen 4/28- LBM overnight- has been refusing miralax - made it prn- taking metamucil  5/13- LBM this AM 13. ?left knee effusion. Not currently bothersome. Consider imaging.  14.  SIRS- Fever/hypotension/tachycardia - 4/21 Currently afebrile, he got to 50 cc IVF yesterday, CXR negative, EKG sinus tachycardia. Patient was started on Maxipime  and vancomycin  yesterday.  Appears to be doing much better today.  WBC not elevated at 7.1.  Continue to follow blood cultures 4/22- Blood Cx's (-) so far- WBC 7.3- will con't IV ABX until know final results of blood Cx's- CXR (-)- but temp was 103.2 yesterday overnight.  4/23- Blood C'x (-) x2 days so far- T 99.9 this Am after a few sips of coffee- was 100.4 this AM- will con't IV ABX since concerned about possible bacteremia- esp since having low grade temps still 4/24- No more fevers, even low grade- no growth on blood Cx' bu it says that didn't get enough blood, so much be falsely negative 4/25- No growth for 5 days, but it also said not enough blood to be sure was a true negative. Will change stop date til tomorrow -06/08/23 at 3am temp recorded as 100.3 but 5am was 98.3; will get CBC w/diff, monitor temps closely today, if continued low grade temps, will reach out to ID. BCx NGTD x5d. Didn't get vanc yesterday? Or cefepime ? Unclear why. Cefepime  started this morning at 0011. -06/09/23 temp 100.3 at 3am  yesterday and then 100 later in the day, but no fevers since, no tachycardia.  -WBC yesterday 8.9.  -Now with some confusion; spoke with Dr. Levern Reader of ID, stated cefepime  could be the cause of the confusion -recommended we stop all abx (which ended yesterday anyway) -watch the patient OFF abx -if fevers >100.3 occur then she recommended repeat BCx and CXR and could call her back to see if any abx would be advised at that time.  -Head CT and dopplers ordered as well, as above 4/28- CT looks good- no fever, WBC is OK- off ABX 4/29- Also Dopplers (-) feeling good this AM 06/16/23 seems to have resolved. 15. Tachycardia 4/22- doing better- right at 100 this AM- will monitor- likely can be from dregs of infection vs OH. -will monitor  4/23- Tachycardia improving- 4/24-27 HR in 90's  right now- doing better  4/29- 4/30- HR running in 90s-low 100's today 5/12 heart rate in the 70s and 80s primarily, continue to monitor    06/27/2023    4:11 AM 06/26/2023    7:43 PM 06/26/2023    6:49 PM  Vitals with BMI  Weight   201 lbs 8 oz  BMI   26.59  Systolic 124 113 295  Diastolic 77 70 66  Pulse 89 96 106    16. R hand swelling  4/23- doing ice- will do xrays 4/24- Xray doesn't look like cause of swelling, however could be due to new shunt? 4/30- resolved 17. Hypocalcemia 4/24- his Albumin  is 2.1, so probably ok, and phos coming down- will leave to renal- thank you for their help  5/1- Ca 9.3 5/8- Ca 8.8 5/15- Ca 8.5 18. Hyperkalemia 4/28- K+ 6.3 today- got hypoosmotic bath to reduce K+ per note from RN/HD nurse? Also has Na of 127- being addressed by HD/renal- Phos also 5.8- but again, getting HD today 5/1- K+ down to 5.6 and Na 129 5/6- Na 133 and K+ down to 4.7 from 5.6 5/7- refused labs this AM- wants in HD 5/9- K_ came back as 4.5 5/12 potassium elevated today, chronic issue has dialysis today.  Treatment per renal team 5/14- K+ down to 5.7- from 6.2- has HD today 5/15- K+ down to 5.5-   19. R>L knee pain 4/29- will ask Ortho if they will tap his R knee- I have no problem doing injection of knee, however with so much fluid, I would rather Ortho tap him and then do steroid injection.   4/30- we added Prednisone  40 mg daily x 3 days- to see if helps knee pain  5/1- mild reduction in pain so far 5/6- Pt has short term improvement from Prednisone  dosing for knees- con't percocet prn 5/7- since pt was doing so much worse with therapy, asked Ortho to come and tap knee and take off fluid- do steroid injection- they drew off 100CC- 5 different draws required- fluid is pending, but has a lot of WBCs- no organisms on gram stain.   5/8- no growth for 24 hours- R knee pain so much better  -06/22/23 NGTD x3 days, monitor  5/14- No growth  - pain so much better after 100cc fluid removed and got injection      LOS: 28 days A FACE TO FACE EVALUATION WAS PERFORMED  Lindalou Soltis 06/27/2023, 8:23 AM

## 2023-06-27 NOTE — Group Note (Signed)
 Patient Details Name: Carl Filtz Sr. MRN: [id number] DOB: 10-28-1960 Today's Date: 06/27/2023 Goal:  Pt will actively participate in 60 minute therapeutic dance group at supervision seated level.  MET   Group Description: Dance Group: Pt participated in dance group with an emphasis on social interaction, motor planning, increasing overall activity tolerance and bimanual tasks. All songs were selected by group members. Dance moves included AROM of BUE/BLE gross motor movements with an emphasis on building functional endurance.    Individual level documentation: Patient completed group from sitting level. Patientt needed supervision to complete various dance moves.  Pt independently modified dance moves to tolerance and within cervical precautions.   Pt easily engaged in group, commenting and laughing throughout the session.   Pain:  No c/o    [name] 06/27/2023, 3:43 PM

## 2023-06-27 NOTE — Progress Notes (Signed)
 [facility] Progress Note   Subjective:    Patient seen in his room today. He reported that his saline bag ran dry during treatment cause an air alarm, and he was only able to get 3.4L UF.  He does have LE edema bilaterally. Plan for HD 5/16.    Objective Vitals:   06/26/23 1848 06/26/23 1849 06/26/23 1943 06/27/23 0411  BP: 110/88 (P) 110/66 113/70 124/77  Pulse: (!) 104 (!) (P) 106 96 89  Resp: 13 (P) 13 18 18   Temp:   98.2 F (36.8 C) 99 F (37.2 C)  TempSrc:    Oral  SpO2: 100% (P) 98% 95% 100%  Weight:  (P) 91.4 kg    Height:       Physical Exam General: Appears well, nad , room air Heart: RRR Lungs: Clear throughout  Abdomen: Soft, non-distended Extremities: trace LE edema Dialysis Access:  RUE AVF +bruit   Filed Weights   06/26/23 0418 06/26/23 1527 06/26/23 1849  Weight: 94 kg 94.4 kg (P) 91.4 kg    Intake/Output Summary (Last 24 hours) at 06/27/2023 1259 Last data filed at 06/26/2023 1849 Gross per 24 hour  Intake --  Output 3.4 ml  Net -3.4 ml    Additional Objective Labs: Basic Metabolic Panel: Recent Labs  Lab 06/21/23 1400 06/24/23 1536 06/25/23 2107 06/26/23 1558  NA 131* 132* 133* 132*  K 5.3* 6.2* 5.7* 5.5*  CL 91* 94* 94* 94*  CO2 23 22 26 23   GLUCOSE 110* 114* 109* 112*  BUN 82* 87* 69* 85*  CREATININE 7.87* 8.84* 8.18* 9.17*  CALCIUM  8.6* 8.6* 8.5* 8.4*  PHOS 4.5 4.5  --  5.0*   Liver Function Tests: Recent Labs  Lab 06/21/23 1400 06/24/23 1536 06/26/23 1558  ALBUMIN  2.3* 2.4* 2.4*   No results for input(s): "LIPASE", "AMYLASE" in the last 168 hours. CBC: Recent Labs  Lab 06/21/23 1400 06/24/23 1535  WBC 7.2 7.7  NEUTROABS  --  5.2  HGB 7.6* 8.1*  HCT 24.2* 25.5*  MCV 89.6 89.8  PLT 419* 382   Blood Culture    Component Value Date/Time   SDES FLUID 06/18/2023 1357   SPECREQUEST NONE 06/18/2023 1357   CULT  06/18/2023 1357    NO GROWTH 3 DAYS Performed at [facility], [address],  [place], [place] [address]    REPTSTATUS 06/22/2023 FINAL 06/18/2023 1357    Cardiac Enzymes: No results for input(s): "CKTOTAL", "CKMB", "CKMBINDEX", "TROPONINI" in the last 168 hours. CBG: No results for input(s): "GLUCAP" in the last 168 hours. Iron Studies: No results for input(s): "IRON", "TIBC", "TRANSFERRIN", "FERRITIN" in the last 72 hours. Lab Results  Component Value Date   INR 1.1 05/27/2023   INR 1.1 05/25/2023   INR 1.1 09/17/2021   Studies/Results: No results found.  Medications:    (feeding supplement) PROSource Plus  30 mL Oral BID BM   atorvastatin   10 mg Oral Q Thu   carbamide peroxide  5 drop Right EAR BID   Chlorhexidine  Gluconate Cloth  6 each Topical Q0600   Chlorhexidine  Gluconate Cloth  6 each Topical Q0600   cinacalcet   120 mg Oral Q supper   darbepoetin (ARANESP ) injection - DIALYSIS  150 mcg Subcutaneous Q Tue-1800   gabapentin   300 mg Oral QHS   Gerhardt's butt cream   Topical BID   pantoprazole   40 mg Oral BID AC   psyllium  1 packet Oral TID   senna-docusate  2 tablet  Oral BID   sevelamer  carbonate  3,200 mg Oral TID WC   sodium zirconium cyclosilicate   10 g Oral Daily   sorbitol   30 mL Oral Once   topiramate   25 mg Oral BID    Dialysis Orders: OP Dialysis Orders [facility] - MWF 4h  B400   85.5kg   2K bath  AVF   - Heparin  3000 units IV three times per week  - Mircera 60mcg IV q 2 weeks- last dose 05/24/23  - Calcitriol  1.5mcg PO q HD  Assessment/Plan: 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 Next HD 5/16.  Hyperkalemia: K+ 5.5 on 06/26/23. Chronic issue.  On Lokelma  daily now. Monitor trend. Needs updated electrolytes. I ordered updated bmet to check potassium.  HTN/volume: BP controlled/stable, still above EDW with some edema on exam, push UFG as tolerated. Patient working hard on fluid restriction.  Anemia of ESRD: Hgb 8.1, Arranesp increased to 150 q Tues -last 5/13.  He is a  TEFL teacher Witness and does not accept blood products except in emergent situations. Secondary HPTH: CorrCa 10, calcitriol  on hold for now/Phos above goal, continue sevelamer  and sensipar  Nutrition: Alb 2.4, continue protein supplements. Patient reports appetite is better but states that the food is subpar.   Carl Lorenzo, NP [facility] 06/27/2023,12:59 PM

## 2023-06-27 NOTE — Progress Notes (Signed)
 Occupational Therapy Session Note  Patient Details  Name: [name]. MRN: [id number] Date of Birth: [birth year]  Today's Date: 06/27/2023 OT Individual Time: 4098-1191 OT Individual Time Calculation (min): 72 min    Short Term Goals: Week 4:  OT Short Term Goal 1 (Week 4): Pt will initiate completing 9HPT for demonstration of increased BUE abilities OT Short Term Goal 2 (Week 4): Pt will complete UB dressing with Min A OT Short Term Goal 3 (Week 4): Pt will complete sit to stand at Mod A consistently throughout therapy sessions  Skilled Therapeutic Interventions/Progress Updates:    Skilled OT intervention with focus on sit<>stand, standing balance, bathing at shower level, dressing with sit<>stand in [place], and safety awareness to increase independence with BADLs. All transfers with [facility] for time mgmt. Sit<>stand in [place] with CGA from [facility] and min A from TTB in shoewr. CGA for bathing in shower. Lateral leans to bathe buttocks. Pt used reacher to thread BLE into pants. Assist for pulling pangs over hips when standing in [place]. Continued education on cervical precautions. Pt requires more then reasonable amount of time to complete tasks. Pt remained in PWC with RN present.   Therapy Documentation Precautions:  Precautions Precautions: Fall, Cervical, Other (comment) (HD MWF) Recall of Precautions/Restrictions: Impaired Precaution/Restrictions Comments: Verbally reviewed cervical precautions. Required Braces or Orthoses: Cervical Brace Cervical Brace: Soft collar, At all times Restrictions Weight Bearing Restrictions Per Provider Order: No   Pain: Pt denies pain this morning  Therapy/Group: Individual Therapy  [name] 06/27/2023, 11:58 AM

## 2023-06-27 NOTE — Progress Notes (Signed)
 Physical Therapy Session Note  Patient Details  Name: Carl Yamamoto Sr. MRN: [id number] Date of Birth: 1960/08/27  Today's Date: 06/27/2023 PT Individual Time: 0805-0900 PT Individual Time Calculation (min): 55 min   Short Term Goals: Week 4:  PT Short Term Goal 1 (Week 4): Pt will consistantly be able to perform sit <> stands with max A PT Short Term Goal 2 (Week 4): Pt will be able to perform gait x 100' with mod A PT Short Term Goal 3 (Week 4): Pt will be able to perform bed mobility with supervision  Skilled Therapeutic Interventions/Progress Updates: Pt presented sitting EOB agreeable to therapy. Pt states mild unrated pain, no intervention requested during session. Pt dressed and ready to transfer to [facility]. Bed elevated and Sit to stand performed with minA. Completed ambulatory transfer with CGA to PWC. Pt able to set up PWC mod I. Navigated to day room with PWC mod I. Pt agreeable to longer distance ambulation. Pt stood with minA from elevated chair height and ambulated ~154ft with RW and CGA with chair follow. Pt did require several standing rest breaks during ambulation. After seated rest pt completed ambulatory transfer to high/low mat with minA. Pt then completed x 2 modified crunches for core activation. Pt also completed x 5 Sit to stand from 26in height with consistent minA. PTA providing facilitation for improved anterior weight shifting. Pt then able to stand without UE support x 20-30 seconds for static balance challenge. Pt completed ambulatory transfer back to [facility] with minA to stand and CGA to ambulate to PWC. Pt navigated back to room mod I and remained in PWC at end of session with call bell within reach and needs met.      Therapy Documentation Precautions:  Precautions Precautions: Fall, Cervical, Other (comment) (HD MWF) Recall of Precautions/Restrictions: Impaired Precaution/Restrictions Comments: Verbally reviewed cervical precautions. Required Braces or Orthoses:  Cervical Brace Cervical Brace: Soft collar, At all times Restrictions Weight Bearing Restrictions Per Provider Order: No    Therapy/Group: Individual Therapy  Sasan Wilkie 06/27/2023, 12:59 PM

## 2023-06-27 NOTE — Plan of Care (Signed)
  Problem: SCI BOWEL ELIMINATION Goal: RH STG MANAGE BOWEL WITH ASSISTANCE Description: STG Manage Bowel with minimal Assistance. Outcome: Progressing   Problem: RH SKIN INTEGRITY Goal: RH STG SKIN FREE OF INFECTION/BREAKDOWN Description: Manage skin free of infection/breakdown with minimal assistance Outcome: Progressing   Problem: RH SAFETY Goal: RH STG ADHERE TO SAFETY PRECAUTIONS W/ASSISTANCE/DEVICE Description: STG Adhere to Safety Precautions With minimal Assistance/Device. Outcome: Progressing   Problem: RH PAIN MANAGEMENT Goal: RH STG PAIN MANAGED AT OR BELOW PT'S PAIN GOAL Description: Pain<4 w/ prns Outcome: Progressing

## 2023-06-27 NOTE — Group Note (Signed)
 Patient Details Name: Carl Goldner Sr. MRN: [id number] DOB: 04/03/1960 Today's Date: 06/27/2023  Time Calculation: OT Group Time Calculation OT Group Start Time: 1430 OT Group Stop Time: 1530 OT Group Time Calculation (min): 60 min      Group Description: Dance Group: Pt participated in dance group with an emphasis on social interaction, motor planning, increasing overall activity tolerance and bimanual tasks. All songs were selected by group members. Dance moves included AROM of BUE/BLE gross motor movements with an emphasis on building functional endurance.    Individual level documentation: Patient completed group from sitting level. Patientt needed supervision to complete various dance moves with cues for safety and postioning in wc. Patient able to come up with modifications during group.  Pain:  0/10  Precautions:  Falls   Geoffery Kiel 06/27/2023, 3:42 PM

## 2023-06-28 ENCOUNTER — Telehealth: Payer: Self-pay | Admitting: Orthopaedic Surgery

## 2023-06-28 ENCOUNTER — Inpatient Hospital Stay (HOSPITAL_COMMUNITY)

## 2023-06-28 LAB — RENAL FUNCTION PANEL
Albumin: 2.5 g/dL — ABNORMAL LOW (ref 3.5–5.0)
Anion gap: 18 — ABNORMAL HIGH (ref 5–15)
BUN: 73 mg/dL — ABNORMAL HIGH (ref 8–23)
CO2: 24 mmol/L (ref 22–32)
Calcium: 8.3 mg/dL — ABNORMAL LOW (ref 8.9–10.3)
Chloride: 91 mmol/L — ABNORMAL LOW (ref 98–111)
Creatinine, Ser: 9.02 mg/dL — ABNORMAL HIGH (ref 0.61–1.24)
GFR, Estimated: 6 mL/min — ABNORMAL LOW (ref >60)
Glucose, Bld: 105 mg/dL — ABNORMAL HIGH (ref 70–99)
Phosphorus: 5 mg/dL — ABNORMAL HIGH (ref 2.5–4.6)
Potassium: 5.3 mmol/L — ABNORMAL HIGH (ref 3.5–5.1)
Sodium: 133 mmol/L — ABNORMAL LOW (ref 135–145)

## 2023-06-28 LAB — CBC
HCT: 24.6 % — ABNORMAL LOW (ref 39.0–52.0)
Hemoglobin: 7.8 g/dL — ABNORMAL LOW (ref 13.0–17.0)
MCH: 28.5 pg (ref 26.0–34.0)
MCHC: 31.7 g/dL (ref 30.0–36.0)
MCV: 89.8 fL (ref 80.0–100.0)
Platelets: 278 10*3/uL (ref 150–400)
RBC: 2.74 MIL/uL — ABNORMAL LOW (ref 4.22–5.81)
RDW: 16.4 % — ABNORMAL HIGH (ref 11.5–15.5)
WBC: 5.3 10*3/uL (ref 4.0–10.5)
nRBC: 0 % (ref 0.0–0.2)

## 2023-06-28 MED ORDER — ANTICOAGULANT SODIUM CITRATE 4% (200MG/5ML) IV SOLN: 5.0000 mL | Status: DC | PRN

## 2023-06-28 MED ORDER — PENTAFLUOROPROP-TETRAFLUOROETH EX AERO: 1.0000 | INHALATION_SPRAY | CUTANEOUS | Status: DC | PRN

## 2023-06-28 MED ORDER — LIDOCAINE HCL (PF) 1 % IJ SOLN: 5.0000 mL | INTRAMUSCULAR | Status: DC | PRN

## 2023-06-28 MED ORDER — ALTEPLASE 2 MG IJ SOLR: 2.0000 mg | Freq: Once | INTRAMUSCULAR | Status: DC | PRN

## 2023-06-28 MED ORDER — HEPARIN SODIUM (PORCINE) 1000 UNIT/ML DIALYSIS: 1000.0000 [IU] | INTRAMUSCULAR | Status: DC | PRN

## 2023-06-28 MED ORDER — HEPARIN SODIUM (PORCINE) 1000 UNIT/ML DIALYSIS
3000.0000 [IU] | INTRAMUSCULAR | Status: DC | PRN
  Administered 2023-06-28: 3000 [IU] via INTRAVENOUS_CENTRAL

## 2023-06-28 MED ORDER — LIDOCAINE-PRILOCAINE 2.5-2.5 % EX CREA: 1.0000 | TOPICAL_CREAM | CUTANEOUS | Status: DC | PRN

## 2023-06-28 NOTE — Telephone Encounter (Signed)
 Patient called and said that can [name] view the xray that they did in the hospital. He is currently in the hospital. [phone]

## 2023-06-28 NOTE — Group Note (Signed)
 Patient Details Name: Carl Linscheid Sr. MRN: [id number] DOB: 1960/04/07 Today's Date: 06/28/2023  Time Calculation: OT Group Time Calculation OT Group Start Time: 1100 OT Group Stop Time: 1200 OT Group Time Calculation (min): 60 min      Group Description: Fine motor: Pt participated in group session with a focus on left> right  FMC, functional reach with both UE, problem solving, visual attention, and social interaction. Pt actively participated in group game of Bingo  where pts were instructed on rules of game; pt had opportunity to pick up small manipulatives (chips) and a marker with predominantly level hand. Pt performed tasks in sitting.   Pain:  No c/o pain       [name] [place] 06/28/2023, 2:31 PM

## 2023-06-28 NOTE — Progress Notes (Signed)
 Physical Therapy Session Note  Patient Details  Name: Carl Gower Sr. MRN: [id number] Date of Birth: 1960-12-16  Today's Date: 06/28/2023 PT Individual Time: 0905-1015 PT Individual Time Calculation (min): 70 min   Short Term Goals: Week 2:  PT Short Term Goal 1 (Week 2): Pt will progress transfers to mod assist PT Short Term Goal 1 - Progress (Week 2): Met PT Short Term Goal 2 (Week 2): Pt will tolerate standing with lift equipment x 5 min preparation for gait PT Short Term Goal 2 - Progress (Week 2): Met PT Short Term Goal 3 (Week 2): Pt will be able to perform bed mobilty with min assist for supine <> sit PT Short Term Goal 3 - Progress (Week 2): Progressing toward goal Week 3:  PT Short Term Goal 1 (Week 3): Pt will perform bed mobility with min assist PT Short Term Goal 1 - Progress (Week 3): Met PT Short Term Goal 2 (Week 3): Pt will perform sit <> stands with mod A PT Short Term Goal 2 - Progress (Week 3): Progressing toward goal PT Short Term Goal 3 (Week 3): Pt will be able to gait x 150' with mod assist PT Short Term Goal 3 - Progress (Week 3): Progressing toward goal PT Short Term Goal 4 (Week 3): Pt will be able to independently perform and verbalize pressure relief techniques with PWC PT Short Term Goal 4 - Progress (Week 3): Met Week 4:  PT Short Term Goal 1 (Week 4): Pt will consistantly be able to perform sit <> stands with max A PT Short Term Goal 2 (Week 4): Pt will be able to perform gait x 100' with mod A PT Short Term Goal 3 (Week 4): Pt will be able to perform bed mobility with supervision  Skilled Therapeutic Interventions/Progress Updates:    Pt arriving from transport from xray, so missed the first few minutes of session. Pt performed bed mobility with supervision to come to EOB. Assisted with donning of shoes from EOB for time management. Raised bed height to very elevated height and performed sit > stand with min A with RW with facilitation for weighshift  anteriorly and focused on gait with RW in hallway with cues for upright posture, RW placement, and CGA for balance x 125'. NMR for sit <> stand retraining with RW from elevated surface (28" with min assist x 2 and min A from 27.5") and toe taps with RLE on 4"step with RW for support for R hip strengthening and in preparation for stair negotiation training. Completed x 3 trials each with 15-20 reps of toe taps. As fatigued, decreased clearance noted on R. 1 episode of L knee buckling in single limb stance with mod assist to recover. Completed ambulatory transfer with min assist with RW to PWC at end of session. Pt continues to have most difficulty with sit > stand unless elevated surface but making excellent progress overall.   Therapy Documentation Precautions:  Precautions Precautions: Fall, Cervical, Other (comment) (HD MWF) Recall of Precautions/Restrictions: Impaired Precaution/Restrictions Comments: Verbally reviewed cervical precautions. Required Braces or Orthoses: Cervical Brace Cervical Brace: Soft collar, At all times Restrictions Weight Bearing Restrictions Per Provider Order: No General: PT Amount of Missed Time (min): 5 Minutes PT Missed Treatment Reason: Xray  Pain: Denies pain. Reports he had medication for R hip earlier and no increased pain noted.    Therapy/Group: Individual Therapy  Gita Lamb Amadeo June, PT, DPT, CBIS  06/28/2023, 11:12 AM

## 2023-06-28 NOTE — Progress Notes (Signed)
 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    Hurting esp when walks  LBM yesterday  Walked self to bathroom yesterday and to w/c this AM- said alarms went off.   ROS:     Pt denies SOB, abd pain, CP, N/V/C/D, and vision changes  B/L R>L knee pain (+)- R knee very improved  Objective:   No results found.     No results for input(s): "WBC", "HGB", "HCT", "PLT" in the last 72 hours.   Recent Labs    06/25/23 2107 06/26/23 1558  NA 133* 132*  K 5.7* 5.5*  CL 94* 94*  CO2 26 23  GLUCOSE 109* 112*  BUN 69* 85*  CREATININE 8.18* 9.17*  CALCIUM  8.5* 8.4*     Intake/Output Summary (Last 24 hours) at 06/28/2023 6045 Last data filed at 06/28/2023 0809 Gross per 24 hour  Intake 880 ml  Output --  Net 880 ml        Physical Exam: Vital Signs Blood pressure 137/80, pulse 91, temperature 99 F (37.2 C), temperature source Oral, resp. rate 18, height 6\' 1"  (1.854 m), weight (P) 91.4 kg, SpO2 100%.         General: awake, alert, appropriate, sitting up in power w/c; NAD HENT: conjugate gaze; oropharynx moist- soft collar in place CV: regular rate and rhythm; no JVD Pulmonary: CTA B/L; no W/R/R- good air movement GI: soft, NT, ND, (+)BS Psychiatric: appropriate- bright affect Neurological: Ox3 MSK- R lateral and groin TTP- c/w trochanteric bursitis and R joint pain  Neurological: Ox3, follows commands, cranial nerves II through XII grossly intact,   PRIOR EXAMS: MSK- R knee much less swollen MsK: B/L knees with effusions- less TTP on R knee this AM- looks good- mild effusion , no erythema, not really focally warm to touch- Both have effusions, - R hand swelling looks good/resolved  MSK- R hand swelling esp ulnar side of hand/dorsum and L wrist-stable this AM RUE- Biceps 4/5; WE 4-/5; Triceps 2/5; Grip 2-/5  and FA 1/5 LUE- 4/5 except triceps 3+ to 4-/5 RLE- HF 1 to 2-/5; KE 2-/5; DF/PF 2-/5 LLE- HF 3/5; KE 4-/5; DF/PF 4/5    Skin: C/D/I. Multiple lesions on BL legs with various stages of healing.  + RUE fistula - intact   MSK:      LLE knee with palpable effusion, no warmth, no ttp Mood: Pleasant affect, appropriate mood.  Neurologic exam: Alert and oriented x 4, fluent, follows commands, cranial nerves II through XII grossly intact, no hypertonia noted  Sensation: Reduced to light touch in distal right lower extremity to the mid-thigh       Strength:                RUE: 2/5 SA, 2/5 EF, 2/5 EE, 2/5 WE, 2/5 FF, 2/5 FA                LUE:  3/5 SA, 3/5 EF, 3/5 EE, 3/5 WE, 3/5 FF, 3/5 FA  RLE: 2/5 HF, 2/5 KE, 2/5  DF, 2/5  EHL, 2/5  PF                 LLE:  3/5 HF, 4/5 KE, 4/5  DF, 4/5  EHL, 4/5  PF  Neuroexam stable 4/21  Assessment/Plan: 1. Functional deficits which require 3+ hours per day of interdisciplinary therapy in a comprehensive inpatient rehab setting. Physiatrist is providing close team supervision and 24 hour management of active medical problems listed below. Physiatrist and rehab team continue to assess barriers to discharge/monitor patient progress toward functional and medical goals  Care Tool:  Bathing    Body parts bathed by patient: Right arm, Left arm, Chest, Abdomen, Front perineal area, Buttocks, Face, Left lower leg, Right lower leg, Left upper leg, Right upper leg   Body parts bathed by helper: Left arm, Front perineal area, Buttocks, Right lower leg, Left lower leg     Bathing assist Assist Level: Contact Guard/Touching assist     Upper Body Dressing/Undressing Upper body dressing   What is the patient wearing?: Pull over shirt    Upper body assist Assist Level: Supervision/Verbal cueing    Lower Body Dressing/Undressing Lower body dressing      What is the patient wearing?: Pants, Underwear/pull up     Lower body assist Assist  for lower body dressing: Minimal Assistance - Patient > 75%     Toileting Toileting Toileting Activity did not occur (Clothing management and hygiene only): N/A (no void or bm) (anuric, HD patient)  Toileting assist Assist for toileting: Maximal Assistance - Patient 25 - 49%     Transfers Chair/bed transfer  Transfers assist     Chair/bed transfer assist level: Moderate Assistance - Patient 50 - 74% (w/FWW stand pivot x1 a.)     Locomotion Ambulation   Ambulation assist   Ambulation activity did not occur: Safety/medical concerns  Assist level: Minimal Assistance - Patient > 75% Assistive device: Walker-rolling Max distance: 92   Walk 10 feet activity   Assist  Walk 10 feet activity did not occur: Safety/medical concerns  Assist level: Minimal Assistance - Patient > 75% Assistive device: Walker-rolling   Walk 50 feet activity   Assist Walk 50 feet with 2 turns activity did not occur: Safety/medical concerns  Assist level: Minimal Assistance - Patient > 75% Assistive device: Walker-rolling    Walk 150 feet activity   Assist Walk 150 feet activity did not occur: Safety/medical concerns  Assist level: 2 helpers      Walk 10 feet on uneven surface  activity   Assist Walk 10 feet on uneven surfaces activity did not occur: Safety/medical concerns         Wheelchair     Assist Is the patient using a wheelchair?: Yes Type of Wheelchair: Power    Wheelchair assist level: Independent Max wheelchair distance: 300    Wheelchair 50 feet with 2 turns activity    Assist        Assist Level: Independent   Wheelchair 150 feet activity     Assist      Assist Level: Independent   Blood pressure 137/80, pulse 91, temperature 99 F (37.2 C), temperature source Oral, resp. rate 18, height 6\' 1"  (1.854 m), weight (P) 91.4 kg, SpO2 100%.  Medical Problem List and Plan: 1. 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  Antithrombotics: -DVT/anticoagulation:  Mechanical: Sequential compression devices, below knee Bilateral lower extremities             -antiplatelet therapy: N/A--no antiplatelet due to hx of recurrent GIB  4/25- d/w family- they agree that Lovenox  not appropriate at this time.   -06/09/23 ordered dopplers to r/o clots given low grade temps recently  4/28- LE Dopplers (-) for DVT 3. Pain Management:  oxycodone  prn. Gabapentin  300 mg/hs.              --has Flexeril  10 mg TID prn              - patient with intermittent, severe b/l headache - add topomax 25 mg BID    4/19- pt rating pain 07/19/08 which is "tolerable" 5/7- got Ortho to come and take fluid off R knee- got 100cc off- pain is no more than 1/10 this AM! 5/8-5/9 knee still doing well 5/13-5/14-  pain controlled- allowed him to walk further yesterday in therapy- still cannot do more than 1 step 5/16- will get R hip xray due to clunk and R hi pain 4. Mood/Behavior/Sleep: LCSW to follow for evaluation and support             -antipsychotic agents: N/A             --melatonin prn for insomnia.  -06/09/23 poor sleep, d/t interruptions, asked nursing to minimize interruptions between 11p-5a (apparently they can't put a sign up, but will communicate in huddle) 5. Neuropsych/cognition: This patient is capable of making decisions on his own behalf. 5/8- Ox3 and appropriate 6. Skin/Wound Care: Routine pressure relief measures.   4/19- due to ulcers on legs, on low air loss mattress 7. Fluids/Electrolytes/Nutrition: Strict I/O. Daily weights. Labs  with HD. 8. ESRD- HD MWF: Schedule HD at the end of the day to help with tolerance of therapy             --Sensipar  and Renvela  for metabolic bone disease.   - Patient reports no longer makes urine  5/13 - last HD yesterday  5/15- last HD yesterday=- they were not able to finish due to equipment issues- will need to monitor fluid intake today- so can make it til tomorrow- they are not doing more today.  9. Anemia of chronic disease: Monitor H/H, aranesp  5/1- Hb down to 7.3- is Jehovah's Witness, so doesn't want transfusions? 5/6- Hb still 7.3- isn't dropping more- No blood or dark stools per pt- no signs of GI loss. 5/7- pt refused labs this AM- wants to get in HD  5/8- Hb 7.1 yesterday after HD- did explained to pt like to get labs before HD so can act on them- will d/w pt again about transfusion? 5/9- Hb 7.6 in last 24 hours- done yesterday- feels better- no dizziness or fatigue- so doesn't feel transfusion is appropriate 5/12 HGB up to 8.1 , improved continue to monitor.  He is on Aranesp  per nephrology 5/15- pt needs to have CBC rechecked in HD tomorrow-  10. H/o  recurrent GIB/Jehovah's Witness: Continue Protonix  BID 11. H/o Hypotension: Has had drop  in BP with HD. Now cervical SCI.  --Monitor for symptoms day after HD and with activity. -4/18 baseline blood pressure stable, monitor with activity 4/19- BP actually slightly elevated today- 140s-150's- con't regimen, since can drop with HD and therapy 4/20- BP actually doing better today- in 100s'110s- however HR elevated, likely to fluid overload 4/21 nephrology started midodrine  10mg  before dialysis, BP appears to be doing a little better today 5/8- BP on low side in last 24 hours- likely made worse due to HD yesterday- denies OH Sx's. Con't to monitor trend 5/9- BP is running in 90's systolic- doesn't feel dizzy or lightheaded sitting, but could drop when standing-will d/w therapy -5/12 BP was a little elevated earlier today but appears  to be improved, continue to monitor.  He has dialysis today 5/13-5/16 BP running normal to very slightly elevated, which is good, since on HD- con't regimen Vitals:   06/26/23 1630 06/26/23 1700 06/26/23 1730 06/26/23 1800  BP: (!) 142/84 123/77 139/85 131/86   06/26/23 1830 06/26/23 1848 06/26/23 1849 06/26/23 1943  BP: 132/88 110/88 (P) 110/66 113/70   06/27/23 0411 06/27/23 1548 06/27/23 2015 06/28/23 0632  BP: 124/77 129/84 (!) 140/78 137/80     12. Constipation: Small BM on 04/14 --had of nulytey yesterday and another today? -Xray 4/17 without excessive stool, patient reports stool still little hard yesterday.  Will increase MiraLAX  to 3 times a day, continue Senokot twice a day. Suppository PRN   06/09/23 LBM yesterday, cont regimen 4/28- LBM overnight- has been refusing miralax - made it prn- taking metamucil  5/13- LBM this AM  5/16- LBM yesterday 13. ?left knee effusion. Not currently bothersome. Consider imaging.  14.  SIRS- Fever/hypotension/tachycardia - 4/21 Currently afebrile, he got to 50 cc IVF yesterday, CXR negative, EKG sinus tachycardia. Patient was started on Maxipime  and vancomycin  yesterday.  Appears to be doing much better today.  WBC not elevated at 7.1.  Continue to follow blood cultures 4/22- Blood Cx's (-) so far- WBC 7.3- will con't IV ABX until know final results of blood Cx's- CXR (-)- but temp was 103.2 yesterday overnight.  4/23- Blood C'x (-) x2 days so far- T 99.9 this Am after a few sips of coffee- was 100.4 this AM- will con't IV ABX since concerned about possible bacteremia- esp since having low grade temps still 4/24- No more fevers, even low grade- no growth on blood Cx' bu it says that didn't get enough blood, so much be falsely negative 4/25- No growth for 5 days, but it also said not enough blood to be sure was a true negative. Will change stop date til tomorrow -06/08/23 at 3am temp recorded as 100.3 but 5am was 98.3; will get CBC w/diff,  monitor temps closely today, if continued low grade temps, will reach out to ID. BCx NGTD x5d. Didn't get vanc yesterday? Or cefepime ? Unclear why. Cefepime  started this morning at 0011. -06/09/23 temp 100.3 at 3am yesterday and then 100 later in the day, but no fevers since, no tachycardia.  -WBC yesterday 8.9.  -Now with some confusion; spoke with Dr. Levern Reader of ID, stated cefepime  could be the cause of the confusion -recommended we stop all abx (which ended yesterday anyway) -watch the patient OFF abx -if fevers >100.3 occur then she recommended repeat BCx and CXR and could call her back to see if any abx would be advised at that time.  -Head  CT and dopplers ordered as well, as above 4/28- CT looks good- no fever, WBC is OK- off ABX 4/29- Also Dopplers (-) feeling good this AM 06/16/23 seems to have resolved. 15. Tachycardia 4/22- doing better- right at 100 this AM- will monitor- likely can be from dregs of infection vs OH. -will monitor  4/23- Tachycardia improving- 4/24-27 HR in 90's right now- doing better  4/29- 4/30- HR running in 90s-low 100's today 5/12 heart rate in the 70s and 80s primarily, continue to monitor    06/28/2023    6:32 AM 06/27/2023    8:15 PM 06/27/2023    3:48 PM  Vitals with BMI  Systolic 137 140 161  Diastolic 80 78 84  Pulse 91 82 97    16. R hand swelling  4/23- doing ice- will do xrays 4/24- Xray doesn't look like cause of swelling, however could be due to new shunt? 4/30- resolved 17. Hypocalcemia 4/24- his Albumin  is 2.1, so probably ok, and phos coming down- will leave to renal- thank you for their help  5/1- Ca 9.3 5/8- Ca 8.8 5/15- Ca 8.5 18. Hyperkalemia 4/28- K+ 6.3 today- got hypoosmotic bath to reduce K+ per note from RN/HD nurse? Also has Na of 127- being addressed by HD/renal- Phos also 5.8- but again, getting HD today 5/1- K+ down to 5.6 and Na 129 5/6- Na 133 and K+ down to 4.7 from 5.6 5/7- refused labs this AM- wants in HD 5/9- K_  came back as 4.5 5/12 potassium elevated today, chronic issue has dialysis today.  Treatment per renal team 5/14- K+ down to 5.7- from 6.2- has HD today 5/15- K+ down to 5.5-  19. R>L knee pain 4/29- will ask Ortho if they will tap his R knee- I have no problem doing injection of knee, however with so much fluid, I would rather Ortho tap him and then do steroid injection.   4/30- we added Prednisone  40 mg daily x 3 days- to see if helps knee pain  5/1- mild reduction in pain so far 5/6- Pt has short term improvement from Prednisone  dosing for knees- con't percocet prn 5/7- since pt was doing so much worse with therapy, asked Ortho to come and tap knee and take off fluid- do steroid injection- they drew off 100CC- 5 different draws required- fluid is pending, but has a lot of WBCs- no organisms on gram stain.   5/8- no growth for 24 hours- R knee pain so much better  -06/22/23 NGTD x3 days, monitor  5/14- No growth  - pain so much better after 100cc fluid removed and got injection 20.  R hip pain  5/16- will get R hip xray- also c/w trochanteric bursitis on exam- if doesn't improve, will do trochanteric bursa injection Tuesday  I spent a total of 37   minutes on total care today- >50% coordination of care- due to  D/w pt and PT and nursing about R hip pain and not walking to bathroom/w/c on his own   LOS: 29 days A FACE TO FACE EVALUATION WAS PERFORMED  Mollyann Halbert 06/28/2023, 8:12 AM

## 2023-06-28 NOTE — Progress Notes (Signed)
 Occupational Therapy Session Note  Patient Details  Name: [name]. MRN: [id number] Date of Birth: 1960/04/05  Today's Date: 06/28/2023 OT Individual Time: 7829-5621 OT Individual Time Calculation (min): 64 min    Short Term Goals: Week 4:  OT Short Term Goal 1 (Week 4): Pt will initiate completing 9HPT for demonstration of increased BUE abilities OT Short Term Goal 2 (Week 4): Pt will complete UB dressing with Min A OT Short Term Goal 3 (Week 4): Pt will complete sit to stand at Mod A consistently throughout therapy sessions  Skilled Therapeutic Interventions/Progress Updates:  Pt greeted seated in PWC eating breakfast, pt agreeable to OT intervention.      Transfers/bed mobility/functional mobility: pt completed sit>stands with MIN A from elevated PWC ~ 24 inch.   Therapeutic activity:  Pt completed short distance functional ambulation task with pt instructed to transport blocks from one surface to the other to simulate IADL tasks. Pt completed functional ambulation with Rw and CGA. Pt using RUE to transport blocks to facilitate improve RUE FMC.   Pt completed standing tolerance task with pt standing 2 trials of 3 mins to engage in [place] game as well as matching game. Pt stood with supervision with unilateral support.    ADLs:   Self feeding: pt completed self feeding tasks with supervision. Pt using LUE to use utensils but completes hand to mouth pattern with RUE, + time needed.    Ended session with pt supine in bed as pt was being transported to xray.   Therapy Documentation Precautions:  Precautions Precautions: Fall, Cervical, Other (comment) (HD MWF) Recall of Precautions/Restrictions: Impaired Precaution/Restrictions Comments: Verbally reviewed cervical precautions. Required Braces or Orthoses: Cervical Brace Cervical Brace: Soft collar, At all times Restrictions Weight Bearing Restrictions Per Provider Order: No Pain: no pain    Therapy/Group: Individual  Therapy  Mollie Anger [facility] 06/28/2023, 11:19 AM

## 2023-06-28 NOTE — Progress Notes (Signed)
 Received patient in bed to unit.  Alert and oriented.  Informed consent signed and in chart.   TX duration:3 hours and 15 minutes  Patient tolerated well.  Transported back to the room  Alert, without acute distress.  Hand-off given to patient's nurse.   Access used: Right AV fistula Access issues: none  Total UF removed: 4.5L Medication(s) given: none   06/28/23 1838  Vitals  Temp 98.1 F (36.7 C)  BP 109/64  Pulse Rate (!) 109  Resp 17  Oxygen Therapy  SpO2 100 %  O2 Device Room Air  During Treatment Monitoring  Dialysate Potassium Concentration 2  Dialysate Calcium  Concentration 2.5  Duration of HD Treatment -hour(s) 3.25 hour(s)  Cumulative Fluid Removed (mL) per Treatment  4500.22  HD Safety Checks Performed Yes  Intra-Hemodialysis Comments Tx completed  Dialysis Fluid Bolus Normal Saline  Bolus Amount (mL) 300 mL  Post Treatment  Dialyzer Clearance Clear  Liters Processed 78  Fluid Removed (mL) 4500 mL  Tolerated HD Treatment Yes  AVG/AVF Arterial Site Held (minutes) 6 minutes  AVG/AVF Venous Site Held (minutes) 6 minutes  Fistula / Graft Right Forearm Arteriovenous fistula  Placement Date/Time: 07/16/17 (c) 1158   Placed prior to admission: No  Orientation: Right  Access Location: Forearm  Access Type: Arteriovenous fistula  Status Deaccessed     Luciano Ruths LPN [facility]

## 2023-06-28 NOTE — Progress Notes (Signed)
 Patient ID: Carl Saitta Sr., male   DOB: 1961-01-29, 63 y.o.   MRN: [id number]  Have reached out to [facility] to see if could offer a bed. Await return call.

## 2023-06-28 NOTE — Progress Notes (Signed)
 Physical Therapy Weekly Progress Note  Patient Details  Name: Carl Smedley Sr. MRN: [id number] Date of Birth: 28-Aug-1960  Beginning of progress report period: Jun 22, 2023 End of progress report period: Jun 28, 2023   Patient has met 3 of 3 short term goals.  Pt is making great functional progress overall, progressing his gait with RW at overall min assist level with w/c follow, no longer needing R hand orthosis. Pt continues to have most difficulty with sit <> stands from any surface lower than about 27" and limits his transfer abilities for ambulatory transfers if surface cannot be elevated (otherwise can be CGA with slideboard transfers). Pt is mod I with PWC functions and mobility. Working towards initiation of stair training. Plan continues to be SNF due to inaccessible home environment and HD schedule at this time (18 steps at home) with decreased family support.  Patient continues to demonstrate the following deficits muscle weakness and muscle joint tightness, decreased cardiorespiratoy endurance, abnormal tone, unbalanced muscle activation, and decreased coordination, and decreased sitting balance, decreased standing balance, decreased postural control, and decreased balance strategies and therefore will continue to benefit from skilled PT intervention to increase functional independence with mobility.  Patient progressing toward long term goals..  Continue plan of care.  PT Short Term Goals Week 4:  PT Short Term Goal 1 (Week 4): Pt will consistantly be able to perform sit <> stands with max A PT Short Term Goal 1 - Progress (Week 4): Met (if elevated surface) PT Short Term Goal 2 (Week 4): Pt will be able to perform gait x 100' with mod A PT Short Term Goal 2 - Progress (Week 4): Met PT Short Term Goal 3 (Week 4): Pt will be able to perform bed mobility with supervision PT Short Term Goal 3 - Progress (Week 4): Met Week 5:  PT Short Term Goal 1 (Week 5): = LTGS due to  ELOS  Skilled Therapeutic Interventions/Progress Updates:  Ambulation/gait training;Cognitive remediation/compensation;Discharge planning;DME/adaptive equipment instruction;Functional mobility training;Pain management;Psychosocial support;Therapeutic Activities;Splinting/orthotics;UE/LE Strength taining/ROM;Visual/perceptual remediation/compensation;Wheelchair Designer, jewellery;Therapeutic Exercise;Skin care/wound management;Patient/family education;Neuromuscular re-education;Disease management/prevention;Balance/vestibular training;Community reintegration;Functional electrical stimulation   Therapy Documentation Precautions:  Precautions Precautions: Fall, Cervical, Other (comment) (HD MWF) Recall of Precautions/Restrictions: Impaired Precaution/Restrictions Comments: Verbally reviewed cervical precautions. Required Braces or Orthoses: Cervical Brace Cervical Brace: Soft collar, At all times Restrictions Weight Bearing Restrictions Per Provider Order: No   Faye Hoops, PT, DPT, CBIS  06/28/2023, 11:18 AM

## 2023-06-28 NOTE — Progress Notes (Signed)
 [facility] Progress Note   Subjective:    Patient seen in his room today. He was in his wheelchair and had completed therapy today. No reported dyspnea, but he does have +1 LE edema bilaterally. Would like to do at least 4 - 4.5L UF for today's treatment. Plan for HD 5/16.  K today 5.5 on lokelma  daily.  Objective Vitals:   06/27/23 0411 06/27/23 1548 06/27/23 2015 06/28/23 0632  BP: 124/77 129/84 (!) 140/78 137/80  Pulse: 89 97 82 91  Resp: 18 19 18 18   Temp: 99 F (37.2 C) 98.5 F (36.9 C) 98.9 F (37.2 C) 99 F (37.2 C)  TempSrc: Oral  Oral Oral  SpO2: 100% 100% 100% 100%  Weight:      Height:       Physical Exam General: Appears well, nad , room air Heart: RRR Lungs: Clear throughout  Abdomen: Soft, non-distended Extremities: +1 LE edema Dialysis Access:  RUE AVF +bruit   Filed Weights   06/26/23 0418 06/26/23 1527 06/26/23 1849  Weight: 94 kg 94.4 kg (P) 91.4 kg    Intake/Output Summary (Last 24 hours) at 06/28/2023 1332 Last data filed at 06/28/2023 0809 Gross per 24 hour  Intake 880 ml  Output --  Net 880 ml    Additional Objective Labs: Basic Metabolic Panel: Recent Labs  Lab 06/21/23 1400 06/24/23 1536 06/25/23 2107 06/26/23 1558  NA 131* 132* 133* 132*  K 5.3* 6.2* 5.7* 5.5*  CL 91* 94* 94* 94*  CO2 23 22 26 23   GLUCOSE 110* 114* 109* 112*  BUN 82* 87* 69* 85*  CREATININE 7.87* 8.84* 8.18* 9.17*  CALCIUM  8.6* 8.6* 8.5* 8.4*  PHOS 4.5 4.5  --  5.0*   Liver Function Tests: Recent Labs  Lab 06/21/23 1400 06/24/23 1536 06/26/23 1558  ALBUMIN  2.3* 2.4* 2.4*   No results for input(s): "LIPASE", "AMYLASE" in the last 168 hours. CBC: Recent Labs  Lab 06/21/23 1400 06/24/23 1535  WBC 7.2 7.7  NEUTROABS  --  5.2  HGB 7.6* 8.1*  HCT 24.2* 25.5*  MCV 89.6 89.8  PLT 419* 382   Blood Culture    Component Value Date/Time   SDES FLUID 06/18/2023 1357   SPECREQUEST NONE 06/18/2023 1357   CULT  06/18/2023 1357    NO  GROWTH 3 DAYS Performed at [facility], [address], [place], [place] [address]    REPTSTATUS 06/22/2023 FINAL 06/18/2023 1357    Lab Results  Component Value Date   INR 1.1 05/27/2023   INR 1.1 05/25/2023   INR 1.1 09/17/2021   Studies/Results: DG HIP UNILAT WITH PELVIS 2-3 VIEWS RIGHT Result Date: 06/28/2023 CLINICAL DATA:  Right hip pain. EXAM: DG HIP (WITH OR WITHOUT PELVIS) 2-3V RIGHT COMPARISON:  None Available. FINDINGS: No acute fracture or dislocation. The bones are osteopenic. Cystic changes of the right femoral head and neck measure up to 3.7 cm. Orthopedic referral is advised. Mild arthritic changes of the right hip. Total left hip arthroplasty. The soft tissues are unremarkable. Vascular calcifications. IMPRESSION: 1. No acute fracture or dislocation. 2. Cystic changes of the right femoral head and neck. Orthopedic referral is advised. Electronically Signed   By: Angus Bark M.D.   On: 06/28/2023 12:44    Medications:  anticoagulant sodium citrate        (feeding supplement) PROSource Plus  30 mL Oral BID BM   atorvastatin   10 mg Oral Q Thu   carbamide peroxide  5 drop Right  EAR BID   Chlorhexidine  Gluconate Cloth  6 each Topical Q0600   cinacalcet   120 mg Oral Q supper   darbepoetin (ARANESP ) injection - DIALYSIS  150 mcg Subcutaneous Q Tue-1800   gabapentin   300 mg Oral QHS   Gerhardt's butt cream   Topical BID   pantoprazole   40 mg Oral BID AC   psyllium  1 packet Oral TID   senna-docusate  2 tablet Oral BID   sevelamer  carbonate  3,200 mg Oral TID WC   sodium zirconium cyclosilicate   10 g Oral Daily   sorbitol   30 mL Oral Once   topiramate   25 mg Oral BID    Dialysis Orders: OP Dialysis Orders [facility] - MWF 4h  B400   85.5kg   2K bath  AVF   - Heparin  3000 units   - Mircera 60mcg IV q 2 weeks- last dose 05/24/23  - Calcitriol  1.5mcg PO q HD  Assessment/Plan: 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 Now in  CIR ESRD: On HD MWF. Next HD 5/16.  Hyperkalemia: K+ 5.5 on 06/26/23. Chronic issue.  On Lokelma  daily now. Monitor trend. Needs updated electrolytes. I ordered updated bmet to check potassium.  HTN/volume: BP controlled/stable, still above EDW with some edema on exam, push UFG as tolerated. Patient requests UF of 4-4.5 at next treatment. Patient working hard on fluid restriction.  Anemia of ESRD: Hgb 8.1, Arranesp increased to 150 q Tues -last 5/13. Getting updated CBC.  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 Secondary HPTH: CorrCa 9.7, Phos above goal, continue sevelamer  and sensipar  Nutrition: Alb 2.4, continue protein supplements. Patient reports appetite is better but states that the food is subpar.   Hersey Lorenzo, NP [facility] 06/28/2023,1:32 PM

## 2023-06-28 NOTE — Group Note (Deleted)
 Patient Details Name: Carl Goodlow Sr. MRN: [id number] DOB: 1960/10/01 Today's Date: 06/28/2023  Time Calculation:        Group Description: Fine motor: Pt participated in group session with a focus on R FMC, functional reach with LUE, problem solving, sit>stands, visual attention, and social interaction. Pt actively participated in group game of Bingo  where pts were instructed on rules of game; pt had opportunity to pick up small manipulatives (chips) and a marker. Pt able to sit during tasks.    Individual level documentation: tolerance to task participation level overall assist level to complete task pain indicators  Pain: Pain Assessment Pain Scale: 0-10 Pain Score: 0-No pain  Precautions:     Henrene Locust [facility] 06/28/2023, 12:54 PM

## 2023-06-28 NOTE — Plan of Care (Signed)
  Problem: RH SKIN INTEGRITY Goal: RH STG SKIN FREE OF INFECTION/BREAKDOWN Description: Manage skin free of infection/breakdown with minimal assistance Outcome: Progressing   Problem: RH SAFETY Goal: RH STG ADHERE TO SAFETY PRECAUTIONS W/ASSISTANCE/DEVICE Description: STG Adhere to Safety Precautions With minimal Assistance/Device. Outcome: Progressing   Problem: RH PAIN MANAGEMENT Goal: RH STG PAIN MANAGED AT OR BELOW PT'S PAIN GOAL Description: Pain<4 w/ prns Outcome: Progressing

## 2023-06-29 DIAGNOSIS — M25551 Pain in right hip: Secondary | ICD-10-CM

## 2023-06-29 DIAGNOSIS — I9589 Other hypotension: Secondary | ICD-10-CM

## 2023-06-29 NOTE — Progress Notes (Signed)
 [facility] Progress Note   Subjective:   Reports feeling well, no new concerns. He is happy that more fluid was removed with HD (had 4.5L UF). Denies SOB, CP, dizziness.   Objective Vitals:   06/28/23 1848 06/28/23 2012 06/29/23 0454 06/29/23 0624  BP:  118/73 122/79   Pulse:  99 96   Resp:  18 18   Temp:  98.8 F (37.1 C) 98.4 F (36.9 C)   TempSrc:  Oral Oral   SpO2:  100% 100%   Weight: 90.4 kg   87.5 kg  Height:       Physical Exam General: alert male in NAD Heart: RRR, no murmurs, rubs or gallops Lungs: CTA bilaterally, respirations unlabored Abdomen: Soft, non-distended, +BS Extremities: trace edema bilaterally Dialysis Access: RUE AVF +t/b  Additional Objective Labs: Basic Metabolic Panel: Recent Labs  Lab 06/24/23 1536 06/25/23 2107 06/26/23 1558 06/28/23 1500  NA 132* 133* 132* 133*  K 6.2* 5.7* 5.5* 5.3*  CL 94* 94* 94* 91*  CO2 22 26 23 24   GLUCOSE 114* 109* 112* 105*  BUN 87* 69* 85* 73*  CREATININE 8.84* 8.18* 9.17* 9.02*  CALCIUM  8.6* 8.5* 8.4* 8.3*  PHOS 4.5  --  5.0* 5.0*   Liver Function Tests: Recent Labs  Lab 06/24/23 1536 06/26/23 1558 06/28/23 1500  ALBUMIN  2.4* 2.4* 2.5*   No results for input(s): "LIPASE", "AMYLASE" in the last 168 hours. CBC: Recent Labs  Lab 06/24/23 1535 06/28/23 1500  WBC 7.7 5.3  NEUTROABS 5.2  --   HGB 8.1* 7.8*  HCT 25.5* 24.6*  MCV 89.8 89.8  PLT 382 278   Blood Culture    Component Value Date/Time   SDES FLUID 06/18/2023 1357   SPECREQUEST NONE 06/18/2023 1357   CULT  06/18/2023 1357    NO GROWTH 3 DAYS Performed at [facility], [address] [place], [place] [address]    REPTSTATUS 06/22/2023 FINAL 06/18/2023 1357    Cardiac Enzymes: No results for input(s): "CKTOTAL", "CKMB", "CKMBINDEX", "TROPONINI" in the last 168 hours. CBG: No results for input(s): "GLUCAP" in the last 168 hours. Iron Studies: No results for input(s): "IRON", "TIBC", "TRANSFERRIN",  "FERRITIN" in the last 72 hours. @lablastinr3 @ Studies/Results: DG HIP UNILAT WITH PELVIS 2-3 VIEWS RIGHT Result Date: 06/28/2023 CLINICAL DATA:  Right hip pain. EXAM: DG HIP (WITH OR WITHOUT PELVIS) 2-3V RIGHT COMPARISON:  None Available. FINDINGS: No acute fracture or dislocation. The bones are osteopenic. Cystic changes of the right femoral head and neck measure up to 3.7 cm. Orthopedic referral is advised. Mild arthritic changes of the right hip. Total left hip arthroplasty. The soft tissues are unremarkable. Vascular calcifications. IMPRESSION: 1. No acute fracture or dislocation. 2. Cystic changes of the right femoral head and neck. Orthopedic referral is advised. Electronically Signed   By: Angus Bark M.D.   On: 06/28/2023 12:44   Medications:   (feeding supplement) PROSource Plus  30 mL Oral BID BM   atorvastatin   10 mg Oral Q Thu   carbamide peroxide  5 drop Right EAR BID   Chlorhexidine  Gluconate Cloth  6 each Topical Q0600   cinacalcet   120 mg Oral Q supper   darbepoetin (ARANESP ) injection - DIALYSIS  150 mcg Subcutaneous Q Tue-1800   gabapentin   300 mg Oral QHS   Gerhardt's butt cream   Topical BID   pantoprazole   40 mg Oral BID AC   psyllium  1 packet Oral TID   senna-docusate  2 tablet  Oral BID   sevelamer  carbonate  3,200 mg Oral TID WC   sodium zirconium cyclosilicate   10 g Oral Daily   sorbitol   30 mL Oral Once   topiramate   25 mg Oral BID    Dialysis Orders: OP Dialysis Orders [facility] - MWF 4h  B400   85.5kg   2K bath  AVF   - Heparin  3000 units   - Mircera 60mcg IV q 2 weeks- last dose 05/24/23  - Calcitriol  1.5mcg PO q HD  Assessment/Plan: 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 Next HD Monday Hyperkalemia:Chronic issue.  On Lokelma  daily now. Continue to reinforce renal diet HTN/volume: BP controlled/stable, still above EDW with some edema on exam, push UFG as tolerated. Tolerates 4-4.5L UF  goals Anemia of ESRD: Hgb 7.8, Arranesp increased to 150 q Tues -last 5/13. Getting updated CBC.  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 Secondary HPTH: CorrCa controlled, phos at goal, continue sevelamer  and sensipar  Nutrition: Alb 2.5, continue protein supplements. Patient reports appetite is better  Ramona Burner, PA-C 06/29/2023, 10:24 AM  [facility] Pager: [phone]

## 2023-06-29 NOTE — Plan of Care (Signed)
  Problem: SCI BOWEL ELIMINATION Goal: RH STG MANAGE BOWEL WITH ASSISTANCE Description: STG Manage Bowel with minimal Assistance. Outcome: Progressing   Problem: RH SKIN INTEGRITY Goal: RH STG SKIN FREE OF INFECTION/BREAKDOWN Description: Manage skin free of infection/breakdown with minimal assistance Outcome: Progressing   Problem: RH SAFETY Goal: RH STG ADHERE TO SAFETY PRECAUTIONS W/ASSISTANCE/DEVICE Description: STG Adhere to Safety Precautions With minimal Assistance/Device. Outcome: Progressing   Problem: RH PAIN MANAGEMENT Goal: RH STG PAIN MANAGED AT OR BELOW PT'S PAIN GOAL Description: Pain<4 w/ prns Outcome: Progressing

## 2023-06-29 NOTE — Progress Notes (Signed)
 PROGRESS NOTE   Subjective/Complaints:  Pt feeling well, up walking around today. Slept ok, pain doing ok/overall managed. LBM yesterday. Anuric at baseline, dialysis yesterday was fine. No other complaints or concerns.   ROS:     Pt denies SOB, abd pain, CP, N/V/C/D, and vision changes  B/L R>L knee pain (+)- R knee very improved +R hip pain-managed  Objective:   DG HIP UNILAT WITH PELVIS 2-3 VIEWS RIGHT Result Date: 06/28/2023 CLINICAL DATA:  Right hip pain. EXAM: DG HIP (WITH OR WITHOUT PELVIS) 2-3V RIGHT COMPARISON:  None Available. FINDINGS: No acute fracture or dislocation. The bones are osteopenic. Cystic changes of the right femoral head and neck measure up to 3.7 cm. Orthopedic referral is advised. Mild arthritic changes of the right hip. Total left hip arthroplasty. The soft tissues are unremarkable. Vascular calcifications. IMPRESSION: 1. No acute fracture or dislocation. 2. Cystic changes of the right femoral head and neck. Orthopedic referral is advised. Electronically Signed   By: Angus Bark M.D.   On: 06/28/2023 12:44       Recent Labs    06/28/23 1500  WBC 5.3  HGB 7.8*  HCT 24.6*  PLT 278     Recent Labs    06/26/23 1558 06/28/23 1500  NA 132* 133*  K 5.5* 5.3*  CL 94* 91*  CO2 23 24  GLUCOSE 112* 105*  BUN 85* 73*  CREATININE 9.17* 9.02*  CALCIUM  8.4* 8.3*     Intake/Output Summary (Last 24 hours) at 06/29/2023 1119 Last data filed at 06/29/2023 0744 Gross per 24 hour  Intake 600 ml  Output 9000 ml  Net -8400 ml        Physical Exam: Vital Signs Blood pressure 122/79, pulse 96, temperature 98.4 F (36.9 C), temperature source Oral, resp. rate 18, height 6\' 1"  (1.854 m), weight 87.5 kg, SpO2 100%.   General: awake, alert, appropriate, standing up using walker; NAD HENT: conjugate gaze; oropharynx moist- soft collar in place CV: regular rate and rhythm; no JVD Pulmonary:  CTA B/L; no W/R/R- good air movement GI: soft, NT, ND, (+)BS Psychiatric: appropriate- bright affect Neurological: Ox3 Ext: BLE with trace edema but much better than prior  PRIOR EXAMS:MSK- R lateral and groin TTP- c/w trochanteric bursitis and R joint pain  Neurological: Ox3, follows commands, cranial nerves II through XII grossly intact,   MSK- R knee much less swollen MsK: B/L knees with effusions- less TTP on R knee this AM- looks good- mild effusion , no erythema, not really focally warm to touch- Both have effusions, - R hand swelling looks good/resolved  MSK- R hand swelling esp ulnar side of hand/dorsum and L wrist-stable this AM RUE- Biceps 4/5; WE 4-/5; Triceps 2/5; Grip 2-/5 and FA 1/5 LUE- 4/5 except triceps 3+ to 4-/5 RLE- HF 1 to 2-/5; KE 2-/5; DF/PF 2-/5 LLE- HF 3/5; KE 4-/5; DF/PF 4/5    Skin: C/D/I. Multiple lesions on BL legs with various stages of healing.  + RUE fistula - intact   MSK:      LLE knee with palpable effusion, no warmth, no ttp Mood: Pleasant affect, appropriate mood.  Neurologic exam: Alert and oriented x  4, fluent, follows commands, cranial nerves II through XII grossly intact, no hypertonia noted  Sensation: Reduced to light touch in distal right lower extremity to the mid-thigh       Strength:                RUE: 2/5 SA, 2/5 EF, 2/5 EE, 2/5 WE, 2/5 FF, 2/5 FA                LUE:  3/5 SA, 3/5 EF, 3/5 EE, 3/5 WE, 3/5 FF, 3/5 FA                RLE: 2/5 HF, 2/5 KE, 2/5  DF, 2/5  EHL, 2/5  PF                 LLE:  3/5 HF, 4/5 KE, 4/5  DF, 4/5  EHL, 4/5  PF  Neuroexam stable 4/21  Assessment/Plan: 1. Functional deficits which require 3+ hours per day of interdisciplinary therapy in a comprehensive inpatient rehab setting. Physiatrist is providing close team supervision and 24 hour management of active medical problems listed below. Physiatrist and rehab team continue to assess barriers to discharge/monitor patient progress toward functional and  medical goals  Care Tool:  Bathing    Body parts bathed by patient: Right arm, Left arm, Chest, Abdomen, Front perineal area, Buttocks, Face, Left lower leg, Right lower leg, Left upper leg, Right upper leg   Body parts bathed by helper: Left arm, Front perineal area, Buttocks, Right lower leg, Left lower leg     Bathing assist Assist Level: Contact Guard/Touching assist     Upper Body Dressing/Undressing Upper body dressing   What is the patient wearing?: Pull over shirt    Upper body assist Assist Level: Supervision/Verbal cueing    Lower Body Dressing/Undressing Lower body dressing      What is the patient wearing?: Pants, Underwear/pull up     Lower body assist Assist for lower body dressing: Minimal Assistance - Patient > 75%     Toileting Toileting Toileting Activity did not occur (Clothing management and hygiene only): N/A (no void or bm) (anuric, HD patient)  Toileting assist Assist for toileting: Maximal Assistance - Patient 25 - 49%     Transfers Chair/bed transfer  Transfers assist     Chair/bed transfer assist level: Minimal Assistance - Patient > 75%     Locomotion Ambulation   Ambulation assist   Ambulation activity did not occur: Safety/medical concerns  Assist level: Minimal Assistance - Patient > 75% Assistive device: Walker-rolling Max distance: 125'   Walk 10 feet activity   Assist  Walk 10 feet activity did not occur: Safety/medical concerns  Assist level: Minimal Assistance - Patient > 75% Assistive device: Walker-rolling   Walk 50 feet activity   Assist Walk 50 feet with 2 turns activity did not occur: Safety/medical concerns  Assist level: Minimal Assistance - Patient > 75% Assistive device: Walker-rolling    Walk 150 feet activity   Assist Walk 150 feet activity did not occur: Safety/medical concerns  Assist level: 2 helpers      Walk 10 feet on uneven surface  activity   Assist Walk 10 feet on uneven  surfaces activity did not occur: Safety/medical concerns         Wheelchair     Assist Is the patient using a wheelchair?: Yes Type of Wheelchair: Power    Wheelchair assist level: Independent Max wheelchair distance: 300    Wheelchair 50 feet  with 2 turns activity    Assist        Assist Level: Independent   Wheelchair 150 feet activity     Assist      Assist Level: Independent   Blood pressure 122/79, pulse 96, temperature 98.4 F (36.9 C), temperature source Oral, resp. rate 18, height 6\' 1"  (1.854 m), weight 87.5 kg, SpO2 100%.  Medical Problem List and Plan: 1. 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  Antithrombotics: -DVT/anticoagulation:  Mechanical: Sequential compression devices, below knee Bilateral lower extremities             -antiplatelet therapy: N/A--no antiplatelet due to hx of recurrent GIB  4/25- d/w family- they agree that Lovenox  not appropriate at this time.   -06/09/23 ordered dopplers to r/o clots given low grade temps recently  4/28- LE Dopplers (-) for DVT 3. Pain Management:  oxycodone  prn. Gabapentin  300 mg/hs.              --has Flexeril  10 mg TID prn              - patient with intermittent, severe b/l headache - add topomax 25 mg BID    4/19- pt rating pain 07/19/08 which is "tolerable" 5/7- got Ortho to come and take fluid off R knee- got 100cc off- pain is no more than 1/10 this AM! 5/8-5/9 knee still doing well 5/13-5/14-  pain controlled- allowed him to walk further yesterday in therapy- still cannot do  more than 1 step 5/16- will get R hip xray due to clunk and R hi pain -06/29/23 R hip xray with cystic changes of femoral head/neck, also degeneration on my review- will need ortho f/up but doubt need for emergent consultation.  4. Mood/Behavior/Sleep: LCSW to follow for evaluation and support             -antipsychotic agents: N/A             --melatonin prn for insomnia.  -06/09/23 poor sleep, d/t interruptions, asked nursing to minimize interruptions between 11p-5a (apparently they can't put a sign up, but will communicate in huddle) 5. Neuropsych/cognition: This patient is capable of making decisions on his own behalf. 5/8- Ox3 and appropriate 6. Skin/Wound Care: Routine pressure relief measures.   4/19- due to ulcers on legs, on low air loss mattress 7. Fluids/Electrolytes/Nutrition: Strict I/O. Daily weights. Labs with HD. 8. ESRD- HD MWF: Schedule HD at the end of the day to help with tolerance of therapy             --Sensipar  and Renvela  for metabolic bone disease.   - Patient reports no longer makes urine  5/13 - last HD yesterday 5/15- last HD yesterday=- they were not able to finish due to equipment issues- will need to monitor fluid intake today- so can make it til tomorrow- they are not doing more today.  9. Anemia of chronic  disease: Monitor H/H, aranesp  5/1- Hb down to 7.3- is Jehov[employer]'s Witness, so doesn't want transfusions? 5/6- Hb still 7.3- isn't dropping more- No blood or dark stools per pt- no signs of GI loss. 5/7- pt refused labs this AM- wants to get in HD  5/8- Hb 7.1 yesterday after HD- did explained to pt like to get labs before HD so can act on them- will d/w pt again about transfusion? 5/9- Hb 7.6 in last 24 hours- done yesterday- feels better- no dizziness or fatigue- so doesn't feel transfusion is appropriate 5/12 HGB up to 8.1 , improved continue to monitor.  He is on Aranesp  per nephrology 5/15- pt needs to have CBC rechecked in HD tomorrow-  -06/29/23 Hgb 7.8,  stable. Monitor.  10. H/o recurrent GIB/Jehovah's Witness: Continue Protonix  BID 11. H/o Hypotension: Has had drop  in BP with HD. Now cervical SCI.  --Monitor for symptoms day after HD and with activity. -4/18 baseline blood pressure stable, monitor with activity 4/19- BP actually slightly elevated today- 140s-150's- con't regimen, since can drop with HD and therapy 4/20- BP actually doing better today- in 100s'110s- however HR elevated, likely to fluid overload 4/21 nephrology started midodrine  10mg  before dialysis, BP appears to be doing a little better today 5/8- BP on low side in last 24 hours- likely made worse due to HD yesterday- denies OH Sx's. Con't to monitor trend 5/9- BP is running in 90's systolic- doesn't feel dizzy or lightheaded sitting, but could drop when standing-will d/w therapy -5/12 BP was a little elevated earlier today but appears to be improved, continue to monitor.  He has dialysis today 5/13-5/16 BP running normal to very slightly elevated, which is good, since on HD- con't regimen -06/29/23 BPs great, monitor Vitals:   06/28/23 1456 06/28/23 1517 06/28/23 1530 06/28/23 1600  BP: (!) 155/83 (!) 147/86 137/78 129/74   06/28/23 1630 06/28/23 1700 06/28/23 1730 06/28/23 1800  BP: (!) 143/85 129/83 128/87 108/73   06/28/23 1838 06/28/23 1842 06/28/23 2012 06/29/23 0454  BP: 109/64 117/69 118/73 122/79     12. Constipation: Small BM on 04/14 --had of nulytey yesterday and another today? -Xray 4/17 without excessive stool, patient reports stool still little hard yesterday.  Will increase MiraLAX  to 3 times a day, continue Senokot twice a day. Suppository PRN   06/09/23 LBM yesterday, cont regimen 4/28- LBM overnight- has been refusing miralax - made it prn- taking metamucil  5/17- LBM yesterday 13. ?left knee effusion. Not currently bothersome. Consider imaging.  14.  SIRS- Fever/hypotension/tachycardia - 4/21 Currently afebrile, he got to 50 cc IVF yesterday,  CXR negative, EKG sinus tachycardia. Patient was started on Maxipime  and vancomycin  yesterday.  Appears to be doing much better today.  WBC not elevated at 7.1.  Continue to follow blood cultures 4/22- Blood Cx's (-) so far- WBC 7.3- will con't IV ABX until know final results of blood Cx's- CXR (-)- but temp was 103.2 yesterday overnight.  4/23- Blood C'x (-) x2 days so far- T 99.9 this Am after a few sips of coffee- was 100.4 this AM- will con't IV ABX since concerned about possible bacteremia- esp since having low grade temps still 4/24- No more fevers, even low grade- no growth on blood Cx' bu it says that didn't get enough blood, so much be falsely negative 4/25- No growth for 5 days, but it also said not enough blood to be sure was a true negative. Will change stop date til tomorrow -06/08/23 at  3am temp recorded as 100.3 but 5am was 98.3; will get CBC w/diff, monitor temps closely today, if continued low grade temps, will reach out to ID. BCx NGTD x5d. Didn't get vanc yesterday? Or cefepime ? Unclear why. Cefepime  started this morning at 0011. -06/09/23 temp 100.3 at 3am yesterday and then 100 later in the day, but no fevers since, no tachycardia.  -WBC yesterday 8.9.  -Now with some confusion; spoke with Dr. Levern Reader of ID, stated cefepime  could be the cause of the confusion -recommended we stop all abx (which ended yesterday anyway) -watch the patient OFF abx -if fevers >100.3 occur then she recommended repeat BCx and CXR and could call her back to see if any abx would be advised at that time.  -Head CT and dopplers ordered as well, as above 4/28- CT looks good- no fever, WBC is OK- off ABX 4/29- Also Dopplers (-) feeling good this AM 06/16/23 seems to have resolved. 15. Tachycardia- resolved 4/22- doing better- right at 100 this AM- will monitor- likely can be from dregs of infection vs OH. -will monitor  4/23- Tachycardia improving- 4/24-27 HR in 90's right now- doing better  4/29- 4/30- HR  running in 90s-low 100's today 5/12 heart rate in the 70s and 80s primarily, continue to monitor    06/29/2023    6:24 AM 06/29/2023    4:54 AM 06/28/2023    8:12 PM  Vitals with BMI  Weight 193 lbs    BMI 25.47    Systolic  122 118  Diastolic  79 73  Pulse  96 99    16. R hand swelling  4/23- doing ice- will do xrays 4/24- Xray doesn't look like cause of swelling, however could be due to new shunt? 4/30- resolved 17. Hypocalcemia 4/24- his Albumin  is 2.1, so probably ok, and phos coming down- will leave to renal- thank you for their help  5/1- Ca 9.3 5/8- Ca 8.8 5/15- Ca 8.5 18. Hyperkalemia 4/28- K+ 6.3 today- got hypoosmotic bath to reduce K+ per note from RN/HD nurse? Also has Na of 127- being addressed by HD/renal- Phos also 5.8- but again, getting HD today 5/1- K+ down to 5.6 and Na 129 5/6- Na 133 and K+ down to 4.7 from 5.6 5/7- refused labs this AM- wants in HD 5/9- K_ came back as 4.5 5/12 potassium elevated today, chronic issue has dialysis today.  Treatment per renal team 5/14- K+ down to 5.7- from 6.2- has HD today 06/29/23 down to 5.3 yesterday, had dialysis yesterday.  19. R>L knee pain 4/29- will ask Ortho if they will tap his R knee- I have no problem doing injection of knee, however with so much fluid, I would rather Ortho tap him and then do steroid injection.   4/30- we added Prednisone  40 mg daily x 3 days- to see if helps knee pain  5/1- mild reduction in pain so far 5/6- Pt has short term improvement from Prednisone  dosing for knees- con't percocet prn 5/7- since pt was doing so much worse with therapy, asked Ortho to come and tap knee and take off fluid- do steroid injection- they drew off 100CC- 5 different draws required- fluid is pending, but has a lot of WBCs- no organisms on gram stain.   5/8- no growth for 24 hours- R knee pain so much better  -06/22/23 NGTD x3 days, monitor  5/14- No growth  - pain so much better after 100cc fluid removed and got  injection 20.  R  hip pain 5/16- will get R hip xray- also c/w trochanteric bursitis on exam- if doesn't improve, will do trochanteric bursa injection Tuesday -06/29/23 R hip xray with cystic changes of femoral head/neck, also degeneration on my review- will need ortho f/up but doubt need for emergent consultation.     LOS: 30 days A FACE TO FACE EVALUATION WAS PERFORMED  [address] 06/29/2023, 11:19 AM

## 2023-06-29 NOTE — Plan of Care (Signed)
  Problem: Consults Goal: RH SPINAL CORD INJURY PATIENT EDUCATION Description:  See Patient Education module for education specifics.  Outcome: Progressing   Problem: SCI BOWEL ELIMINATION Goal: RH STG MANAGE BOWEL WITH ASSISTANCE Description: STG Manage Bowel with minimal Assistance. Outcome: Progressing Goal: RH STG SCI MANAGE BOWEL WITH MEDICATION WITH ASSISTANCE Description: STG SCI Manage bowel with medication with assistance. Outcome: Progressing   Problem: RH SKIN INTEGRITY Goal: RH STG SKIN FREE OF INFECTION/BREAKDOWN Description: Manage skin free of infection/breakdown with minimal assistance Outcome: Progressing   Problem: RH SAFETY Goal: RH STG ADHERE TO SAFETY PRECAUTIONS W/ASSISTANCE/DEVICE Description: STG Adhere to Safety Precautions With minimal Assistance/Device. Outcome: Progressing   Problem: RH PAIN MANAGEMENT Goal: RH STG PAIN MANAGED AT OR BELOW PT'S PAIN GOAL Description: Pain<4 w/ prns Outcome: Progressing   Problem: RH KNOWLEDGE DEFICIT SCI Goal: RH STG INCREASE KNOWLEDGE OF SELF CARE AFTER SCI Description: Manage increase knowledge of self care after SCI with minimal assistance using educational materials provided to son Outcome: Progressing

## 2023-06-30 MED ORDER — CHLORHEXIDINE GLUCONATE CLOTH 2 % EX PADS
6.0000 | MEDICATED_PAD | Freq: Every day | CUTANEOUS | Status: DC
  Administered 2023-07-01: 6 via TOPICAL

## 2023-06-30 NOTE — Plan of Care (Signed)
  Problem: Consults Goal: RH SPINAL CORD INJURY PATIENT EDUCATION Description:  See Patient Education module for education specifics.  Outcome: Progressing   Problem: SCI BOWEL ELIMINATION Goal: RH STG MANAGE BOWEL WITH ASSISTANCE Description: STG Manage Bowel with minimal Assistance. Outcome: Progressing Goal: RH STG SCI MANAGE BOWEL WITH MEDICATION WITH ASSISTANCE Description: STG SCI Manage bowel with medication with assistance. Outcome: Progressing   Problem: RH SKIN INTEGRITY Goal: RH STG SKIN FREE OF INFECTION/BREAKDOWN Description: Manage skin free of infection/breakdown with minimal assistance Outcome: Progressing   Problem: RH SAFETY Goal: RH STG ADHERE TO SAFETY PRECAUTIONS W/ASSISTANCE/DEVICE Description: STG Adhere to Safety Precautions With minimal Assistance/Device. Outcome: Progressing   Problem: RH PAIN MANAGEMENT Goal: RH STG PAIN MANAGED AT OR BELOW PT'S PAIN GOAL Description: Pain<4 w/ prns Outcome: Progressing   Problem: RH KNOWLEDGE DEFICIT SCI Goal: RH STG INCREASE KNOWLEDGE OF SELF CARE AFTER SCI Description: Manage increase knowledge of self care after SCI with minimal assistance using educational materials provided to son Outcome: Progressing

## 2023-06-30 NOTE — Progress Notes (Signed)
 Patient requesting to be able to go outside today. Assisted in accompanying patient off unit to have a fresh air break this afternoon.

## 2023-06-30 NOTE — Progress Notes (Signed)
 [facility] Progress Note   Subjective:   Reports feeling well today, no concerns. Denies SOB, CP, dizziness.   Objective Vitals:   06/29/23 1333 06/29/23 2027 06/30/23 0452 06/30/23 0502  BP: (!) 145/80 122/68 114/75   Pulse: 78 83 84   Resp: 18 18 18    Temp: 98 F (36.7 C) 97.9 F (36.6 C) 98.1 F (36.7 C)   TempSrc:  Oral Oral   SpO2: 100% 100% 100%   Weight:    88.2 kg  Height:       Physical Exam General: alert male in NAD Heart: RRR, no murmurs, rubs or gallops Lungs: CTA bilaterally, respirations unlabored Abdomen: Soft, non-distended, +BS Extremities: trace edema bilaterally Dialysis Access: RUE AVF +t/b  Additional Objective Labs: Basic Metabolic Panel: Recent Labs  Lab 06/24/23 1536 06/25/23 2107 06/26/23 1558 06/28/23 1500  NA 132* 133* 132* 133*  K 6.2* 5.7* 5.5* 5.3*  CL 94* 94* 94* 91*  CO2 22 26 23 24   GLUCOSE 114* 109* 112* 105*  BUN 87* 69* 85* 73*  CREATININE 8.84* 8.18* 9.17* 9.02*  CALCIUM  8.6* 8.5* 8.4* 8.3*  PHOS 4.5  --  5.0* 5.0*   Liver Function Tests: Recent Labs  Lab 06/24/23 1536 06/26/23 1558 06/28/23 1500  ALBUMIN  2.4* 2.4* 2.5*   No results for input(s): "LIPASE", "AMYLASE" in the last 168 hours. CBC: Recent Labs  Lab 06/24/23 1535 06/28/23 1500  WBC 7.7 5.3  NEUTROABS 5.2  --   HGB 8.1* 7.8*  HCT 25.5* 24.6*  MCV 89.8 89.8  PLT 382 278   Blood Culture    Component Value Date/Time   SDES FLUID 06/18/2023 1357   SPECREQUEST NONE 06/18/2023 1357   CULT  06/18/2023 1357    NO GROWTH 3 DAYS Performed at [facility], [address] [place], [place] [address]    REPTSTATUS 06/22/2023 FINAL 06/18/2023 1357    Cardiac Enzymes: No results for input(s): "CKTOTAL", "CKMB", "CKMBINDEX", "TROPONINI" in the last 168 hours. CBG: No results for input(s): "GLUCAP" in the last 168 hours. Iron Studies: No results for input(s): "IRON", "TIBC", "TRANSFERRIN", "FERRITIN" in the last 72  hours. @lablastinr3 @ Studies/Results: No results found. Medications:   (feeding supplement) PROSource Plus  30 mL Oral BID BM   atorvastatin   10 mg Oral Q Thu   carbamide peroxide  5 drop Right EAR BID   Chlorhexidine  Gluconate Cloth  6 each Topical Q0600   cinacalcet   120 mg Oral Q supper   darbepoetin (ARANESP ) injection - DIALYSIS  150 mcg Subcutaneous Q Tue-1800   gabapentin   300 mg Oral QHS   Gerhardt's butt cream   Topical BID   pantoprazole   40 mg Oral BID AC   psyllium  1 packet Oral TID   senna-docusate  2 tablet Oral BID   sevelamer  carbonate  3,200 mg Oral TID WC   sodium zirconium cyclosilicate   10 g Oral Daily   sorbitol   30 mL Oral Once   topiramate   25 mg Oral BID    Dialysis Orders: OP Dialysis Orders [facility] - MWF 4h  B400   85.5kg   2K bath  AVF   - Heparin  3000 units   - Mircera 60mcg IV q 2 weeks- last dose 05/24/23  - Calcitriol  1.5mcg PO q HD  Assessment/Plan: 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 Next HD Monday Hyperkalemia:Chronic issue.  On Lokelma  daily now. Continue to reinforce  renal diet HTN/volume: BP controlled/stable, still above EDW with some edema on exam, push UFG as tolerated. Tolerates 4-4.5L UF goals Anemia of ESRD: Hgb 7.8, Arranesp increased to 150 q Tues -last 5/13. Getting updated CBC.  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 Secondary HPTH: CorrCa controlled, phos at goal, continue sevelamer  and sensipar  Nutrition: Alb 2.5, continue protein supplements. Patient reports appetite is better  Ramona Burner, PA-C 06/30/2023, 12:03 PM  [facility] Pager: [phone]

## 2023-06-30 NOTE — Progress Notes (Signed)
 PROGRESS NOTE   Subjective/Complaints:  Pt feeling again, getting dressed. Slept well, pain well managed, just a little pain in R hip, wants to make sure his ortho reviews his xray. LBM this AM per pt. Anuric at baseline. No other complaints or concerns.   ROS:     Pt denies SOB, abd pain, CP, N/V/C/D, and vision changes  B/L R>L knee pain (+)- R knee very improved +R hip pain-managed  Objective:   No results found.      Recent Labs    06/28/23 1500  WBC 5.3  HGB 7.8*  HCT 24.6*  PLT 278     Recent Labs    06/28/23 1500  NA 133*  K 5.3*  CL 91*  CO2 24  GLUCOSE 105*  BUN 73*  CREATININE 9.02*  CALCIUM  8.3*     Intake/Output Summary (Last 24 hours) at 06/30/2023 1229 Last data filed at 06/30/2023 1000 Gross per 24 hour  Intake 900 ml  Output --  Net 900 ml        Physical Exam: Vital Signs Blood pressure 114/75, pulse 84, temperature 98.1 F (36.7 C), temperature source Oral, resp. rate 18, height 6\' 1"  (1.854 m), weight 88.2 kg, SpO2 100%.   General: awake, alert, appropriate, sitting up in w/c getting dressed; NAD HENT: conjugate gaze; oropharynx moist- soft collar in place CV: regular rate and rhythm; no JVD Pulmonary: CTA B/L; no W/R/R- good air movement GI: soft, NT, ND, (+)BS Psychiatric: appropriate- bright affect Neurological: Ox3 Ext: BLE with trace edema but much better than prior  PRIOR EXAMS:MSK- R lateral and groin TTP- c/w trochanteric bursitis and R joint pain  Neurological: Ox3, follows commands, cranial nerves II through XII grossly intact,   MSK- R knee much less swollen MsK: B/L knees with effusions- less TTP on R knee this AM- looks good- mild effusion , no erythema, not really focally warm to touch- Both have effusions, - R hand swelling looks good/resolved  MSK- R hand swelling esp ulnar side of hand/dorsum and L wrist-stable this AM RUE- Biceps 4/5; WE 4-/5;  Triceps 2/5; Grip 2-/5 and FA 1/5 LUE- 4/5 except triceps 3+ to 4-/5 RLE- HF 1 to 2-/5; KE 2-/5; DF/PF 2-/5 LLE- HF 3/5; KE 4-/5; DF/PF 4/5    Skin: C/D/I. Multiple lesions on BL legs with various stages of healing.  + RUE fistula - intact   MSK:      LLE knee with palpable effusion, no warmth, no ttp Mood: Pleasant affect, appropriate mood.  Neurologic exam: Alert and oriented x 4, fluent, follows commands, cranial nerves II through XII grossly intact, no hypertonia noted  Sensation: Reduced to light touch in distal right lower extremity to the mid-thigh       Strength:                RUE: 2/5 SA, 2/5 EF, 2/5 EE, 2/5 WE, 2/5 FF, 2/5 FA                LUE:  3/5 SA, 3/5 EF, 3/5 EE, 3/5 WE, 3/5 FF, 3/5 FA  RLE: 2/5 HF, 2/5 KE, 2/5  DF, 2/5  EHL, 2/5  PF                 LLE:  3/5 HF, 4/5 KE, 4/5  DF, 4/5  EHL, 4/5  PF  Neuroexam stable 4/21  Assessment/Plan: 1. Functional deficits which require 3+ hours per day of interdisciplinary therapy in a comprehensive inpatient rehab setting. Physiatrist is providing close team supervision and 24 hour management of active medical problems listed below. Physiatrist and rehab team continue to assess barriers to discharge/monitor patient progress toward functional and medical goals  Care Tool:  Bathing    Body parts bathed by patient: Right arm, Left arm, Chest, Abdomen, Front perineal area, Buttocks, Face, Left lower leg, Right lower leg, Left upper leg, Right upper leg   Body parts bathed by helper: Left arm, Front perineal area, Buttocks, Right lower leg, Left lower leg     Bathing assist Assist Level: Contact Guard/Touching assist     Upper Body Dressing/Undressing Upper body dressing   What is the patient wearing?: Pull over shirt    Upper body assist Assist Level: Supervision/Verbal cueing    Lower Body Dressing/Undressing Lower body dressing      What is the patient wearing?: Pants, Underwear/pull up      Lower body assist Assist for lower body dressing: Minimal Assistance - Patient > 75%     Toileting Toileting Toileting Activity did not occur (Clothing management and hygiene only): N/A (no void or bm) (anuric, HD patient)  Toileting assist Assist for toileting: Maximal Assistance - Patient 25 - 49%     Transfers Chair/bed transfer  Transfers assist     Chair/bed transfer assist level: Minimal Assistance - Patient > 75%     Locomotion Ambulation   Ambulation assist   Ambulation activity did not occur: Safety/medical concerns  Assist level: Minimal Assistance - Patient > 75% Assistive device: Walker-rolling Max distance: 125'   Walk 10 feet activity   Assist  Walk 10 feet activity did not occur: Safety/medical concerns  Assist level: Minimal Assistance - Patient > 75% Assistive device: Walker-rolling   Walk 50 feet activity   Assist Walk 50 feet with 2 turns activity did not occur: Safety/medical concerns  Assist level: Minimal Assistance - Patient > 75% Assistive device: Walker-rolling    Walk 150 feet activity   Assist Walk 150 feet activity did not occur: Safety/medical concerns  Assist level: 2 helpers      Walk 10 feet on uneven surface  activity   Assist Walk 10 feet on uneven surfaces activity did not occur: Safety/medical concerns         Wheelchair     Assist Is the patient using a wheelchair?: Yes Type of Wheelchair: Power    Wheelchair assist level: Independent Max wheelchair distance: 300    Wheelchair 50 feet with 2 turns activity    Assist        Assist Level: Independent   Wheelchair 150 feet activity     Assist      Assist Level: Independent   Blood pressure 114/75, pulse 84, temperature 98.1 F (36.7 C), temperature source Oral, resp. rate 18, height 6\' 1"  (1.854 m), weight 88.2 kg, SpO2 100%.  Medical Problem List and Plan: 1. 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  Antithrombotics: -DVT/anticoagulation:  Mechanical: Sequential compression devices, below knee Bilateral lower extremities             -antiplatelet therapy: N/A--no antiplatelet due to hx of recurrent GIB  4/25- d/w family- they agree that Lovenox  not appropriate at this time.   -06/09/23 ordered dopplers to r/o clots given low grade temps recently  4/28- LE Dopplers (-) for DVT 3. Pain Management:  oxycodone  prn. Gabapentin  300 mg/hs.              --has Flexeril  10 mg TID prn              - patient with intermittent, severe b/l headache - add topomax 25 mg BID    4/19- pt rating pain 07/19/08 which is "tolerable" 5/7- got Ortho to come and take fluid off R knee- got 100cc off- pain is no more than 1/10 this AM! 5/8-5/9 knee still doing well 5/13-5/14-  pain controlled- allowed him to walk further yesterday in therapy- still cannot do more than 1 step 5/16- will get R hip xray due to clunk and R hi pain -06/29/23 R hip xray with cystic changes of femoral head/neck, also degeneration on my review- will need ortho f/up but doubt need for emergent consultation. Dr. Christiane Cowing is his primary ortho 4. Mood/Behavior/Sleep: LCSW to follow for evaluation and support             -antipsychotic agents: N/A             --melatonin prn for insomnia.  -06/09/23 poor sleep, d/t interruptions, asked nursing to minimize interruptions between 11p-5a (apparently they can't put a sign up, but will communicate in huddle) 5. Neuropsych/cognition: This patient is capable of making decisions on his own behalf. 5/8- Ox3 and  appropriate 6. Skin/Wound Care: Routine pressure relief measures.   4/19- due to ulcers on legs, on low air loss mattress 7. Fluids/Electrolytes/Nutrition: Strict I/O. Daily weights. Labs with HD. 8. ESRD- HD MWF: Schedule HD at the end of the day to help with tolerance of therapy             --Sensipar  and Renvela  for metabolic bone disease.   - Patient reports no longer makes urine  5/13 - last HD yesterday 5/15- last HD yesterday=- they were not able to finish due to equipment issues- will need to monitor fluid intake today- so can make it til tomorrow- they are not doing more today.  9. Anemia of chronic disease: Monitor H/H, aranesp  5/1- Hb down to 7.3- is Jehovah's Witness, so doesn't want transfusions? 5/6- Hb still 7.3- isn't dropping more- No blood or dark stools per pt- no signs of GI loss. 5/7- pt refused labs this AM- wants to get in HD  5/8- Hb 7.1 yesterday after HD- did explained to pt like to get labs before HD so can act on them- will d/w pt again about transfusion? 5/9- Hb 7.6 in last 24 hours- done yesterday- feels better- no dizziness or fatigue- so doesn't feel transfusion is appropriate 5/12 HGB up to 8.1 , improved  continue to monitor.  He is on Aranesp  per nephrology 5/15- pt needs to have CBC rechecked in HD tomorrow-  -06/29/23 Hgb 7.8, stable. Monitor.  10. H/o recurrent GIB/Jehovah's Witness: Continue Protonix  BID 11. H/o Hypotension: Has had drop  in BP with HD. Now cervical SCI.  --Monitor for symptoms day after HD and with activity. -4/18 baseline blood pressure stable, monitor with activity 4/19- BP actually slightly elevated today- 140s-150's- con't regimen, since can drop with HD and therapy 4/20- BP actually doing better today- in 100s'110s- however HR elevated, likely to fluid overload 4/21 nephrology started midodrine  10mg  before dialysis, BP appears to be doing a little better today 5/8- BP on low side in last 24 hours- likely made worse due to HD  yesterday- denies OH Sx's. Con't to monitor trend 5/9- BP is running in 90's systolic- doesn't feel dizzy or lightheaded sitting, but could drop when standing-will d/w therapy -5/12 BP was a little elevated earlier today but appears to be improved, continue to monitor.  He has dialysis today 5/13-5/16 BP running normal to very slightly elevated, which is good, since on HD- con't regimen -5/17-18/25 BPs great, monitor Vitals:   06/28/23 1600 06/28/23 1630 06/28/23 1700 06/28/23 1730  BP: 129/74 (!) 143/85 129/83 128/87   06/28/23 1800 06/28/23 1838 06/28/23 1842 06/28/23 2012  BP: 108/73 109/64 117/69 118/73   06/29/23 0454 06/29/23 1333 06/29/23 2027 06/30/23 0452  BP: 122/79 (!) 145/80 122/68 114/75     12. Constipation: Small BM on 04/14 --had of nulytey yesterday and another today? -Xray 4/17 without excessive stool, patient reports stool still little hard yesterday.  Will increase MiraLAX  to 3 times a day, continue Senokot twice a day. Suppository PRN   06/09/23 LBM yesterday, cont regimen 4/28- LBM overnight- has been refusing miralax - made it prn- taking metamucil  5/18- LBM this morning per pt 13. ?left knee effusion. Not currently bothersome. Consider imaging.  14.  SIRS- Fever/hypotension/tachycardia - 4/21 Currently afebrile, he got to 50 cc IVF yesterday, CXR negative, EKG sinus tachycardia. Patient was started on Maxipime  and vancomycin  yesterday.  Appears to be doing much better today.  WBC not elevated at 7.1.  Continue to follow blood cultures 4/22- Blood Cx's (-) so far- WBC 7.3- will con't IV ABX until know final results of blood Cx's- CXR (-)- but temp was 103.2 yesterday overnight.  4/23- Blood C'x (-) x2 days so far- T 99.9 this Am after a few sips of coffee- was 100.4 this AM- will con't IV ABX since concerned about possible bacteremia- esp since having low grade temps still 4/24- No more fevers, even low grade- no growth on blood Cx' bu it says that didn't get  enough blood, so much be falsely negative 4/25- No growth for 5 days, but it also said not enough blood to be sure was a true negative. Will change stop date til tomorrow -06/08/23 at 3am temp recorded as 100.3 but 5am was 98.3; will get CBC w/diff, monitor temps closely today, if continued low grade temps, will reach out to ID. BCx NGTD x5d. Didn't get vanc yesterday? Or cefepime ? Unclear why. Cefepime  started this morning at 0011. -06/09/23 temp 100.3 at 3am yesterday and then 100 later in the day, but no fevers since, no tachycardia.  -WBC yesterday 8.9.  -Now with some confusion; spoke with Dr. Levern Reader of ID, stated cefepime  could be the cause of the confusion -recommended we stop all abx (which ended yesterday anyway) -watch the patient OFF  abx -if fevers >100.3 occur then she recommended repeat BCx and CXR and could call her back to see if any abx would be advised at that time.  -Head CT and dopplers ordered as well, as above 4/28- CT looks good- no fever, WBC is OK- off ABX 4/29- Also Dopplers (-) feeling good this AM 06/16/23 seems to have resolved. 15. Tachycardia- resolved 4/22- doing better- right at 100 this AM- will monitor- likely can be from dregs of infection vs OH. -will monitor  4/23- Tachycardia improving- 4/24-27 HR in 90's right now- doing better  4/29- 4/30- HR running in 90s-low 100's today 5/12 heart rate in the 70s and 80s primarily, continue to monitor    06/30/2023    5:02 AM 06/30/2023    4:52 AM 06/29/2023    8:27 PM  Vitals with BMI  Weight 194 lbs 6 oz    Systolic  114 122  Diastolic  75 68  Pulse  84 83    16. R hand swelling  4/23- doing ice- will do xrays 4/24- Xray doesn't look like cause of swelling, however could be due to new shunt? 4/30- resolved 17. Hypocalcemia 4/24- his Albumin  is 2.1, so probably ok, and phos coming down- will leave to renal- thank you for their help  5/1- Ca 9.3 5/8- Ca 8.8 5/15- Ca 8.5 18. Hyperkalemia 4/28- K+ 6.3 today-  got hypoosmotic bath to reduce K+ per note from RN/HD nurse? Also has Na of 127- being addressed by HD/renal- Phos also 5.8- but again, getting HD today 5/1- K+ down to 5.6 and Na 129 5/6- Na 133 and K+ down to 4.7 from 5.6 5/7- refused labs this AM- wants in HD 5/9- K_ came back as 4.5 5/12 potassium elevated today, chronic issue has dialysis today.  Treatment per renal team 5/14- K+ down to 5.7- from 6.2- has HD today 06/29/23 down to 5.3 yesterday, had dialysis yesterday.  19. R>L knee pain 4/29- will ask Ortho if they will tap his R knee- I have no problem doing injection of knee, however with so much fluid, I would rather Ortho tap him and then do steroid injection.   4/30- we added Prednisone  40 mg daily x 3 days- to see if helps knee pain  5/1- mild reduction in pain so far 5/6- Pt has short term improvement from Prednisone  dosing for knees- con't percocet prn 5/7- since pt was doing so much worse with therapy, asked Ortho to come and tap knee and take off fluid- do steroid injection- they drew off 100CC- 5 different draws required- fluid is pending, but has a lot of WBCs- no organisms on gram stain.   5/8- no growth for 24 hours- R knee pain so much better  -06/22/23 NGTD x3 days, monitor  5/14- No growth  - pain so much better after 100cc fluid removed and got injection 20.  R hip pain 5/16- will get R hip xray- also c/w trochanteric bursitis on exam- if doesn't improve, will do trochanteric bursa injection Tuesday -06/29/23 R hip xray with cystic changes of femoral head/neck, also degeneration on my review- will need ortho f/up but doubt need for emergent consultation. Dr. Christiane Cowing is his primary ortho.     LOS: 31 days A FACE TO FACE EVALUATION WAS PERFORMED  [address] 06/30/2023, 12:29 PM

## 2023-07-01 ENCOUNTER — Telehealth: Payer: Self-pay | Admitting: Orthopaedic Surgery

## 2023-07-01 LAB — CBC WITH DIFFERENTIAL/PLATELET
Abs Immature Granulocytes: 0.02 10*3/uL (ref 0.00–0.07)
Basophils Absolute: 0.1 10*3/uL (ref 0.0–0.1)
Basophils Relative: 2 %
Eosinophils Absolute: 0.5 10*3/uL (ref 0.0–0.5)
Eosinophils Relative: 9 %
HCT: 25 % — ABNORMAL LOW (ref 39.0–52.0)
Hemoglobin: 8 g/dL — ABNORMAL LOW (ref 13.0–17.0)
Immature Granulocytes: 0 %
Lymphocytes Relative: 15 %
Lymphs Abs: 0.9 10*3/uL (ref 0.7–4.0)
MCH: 28.6 pg (ref 26.0–34.0)
MCHC: 32 g/dL (ref 30.0–36.0)
MCV: 89.3 fL (ref 80.0–100.0)
Monocytes Absolute: 0.9 10*3/uL (ref 0.1–1.0)
Monocytes Relative: 15 %
Neutro Abs: 3.5 10*3/uL (ref 1.7–7.7)
Neutrophils Relative %: 59 %
Platelets: 260 10*3/uL (ref 150–400)
RBC: 2.8 MIL/uL — ABNORMAL LOW (ref 4.22–5.81)
RDW: 16.4 % — ABNORMAL HIGH (ref 11.5–15.5)
WBC: 5.8 10*3/uL (ref 4.0–10.5)
nRBC: 0 % (ref 0.0–0.2)

## 2023-07-01 LAB — RENAL FUNCTION PANEL
Albumin: 2.5 g/dL — ABNORMAL LOW (ref 3.5–5.0)
Anion gap: 16 — ABNORMAL HIGH (ref 5–15)
BUN: 78 mg/dL — ABNORMAL HIGH (ref 8–23)
CO2: 22 mmol/L (ref 22–32)
Calcium: 7.6 mg/dL — ABNORMAL LOW (ref 8.9–10.3)
Chloride: 94 mmol/L — ABNORMAL LOW (ref 98–111)
Creatinine, Ser: 9.69 mg/dL — ABNORMAL HIGH (ref 0.61–1.24)
GFR, Estimated: 6 mL/min — ABNORMAL LOW (ref >60)
Glucose, Bld: 90 mg/dL (ref 70–99)
Phosphorus: 4.8 mg/dL — ABNORMAL HIGH (ref 2.5–4.6)
Potassium: 5.3 mmol/L — ABNORMAL HIGH (ref 3.5–5.1)
Sodium: 132 mmol/L — ABNORMAL LOW (ref 135–145)

## 2023-07-01 MED ORDER — PENTAFLUOROPROP-TETRAFLUOROETH EX AERO: 1.0000 | INHALATION_SPRAY | CUTANEOUS | Status: DC | PRN

## 2023-07-01 MED ORDER — ALTEPLASE 2 MG IJ SOLR: 2.0000 mg | Freq: Once | INTRAMUSCULAR | Status: DC | PRN

## 2023-07-01 MED ORDER — LIDOCAINE-PRILOCAINE 2.5-2.5 % EX CREA: 1.0000 | TOPICAL_CREAM | CUTANEOUS | Status: DC | PRN

## 2023-07-01 MED ORDER — HEPARIN SODIUM (PORCINE) 1000 UNIT/ML DIALYSIS: 1000.0000 [IU] | INTRAMUSCULAR | Status: DC | PRN

## 2023-07-01 MED ORDER — LIDOCAINE HCL (PF) 1 % IJ SOLN: 5.0000 mL | INTRAMUSCULAR | Status: DC | PRN

## 2023-07-01 MED ORDER — HEPARIN SODIUM (PORCINE) 1000 UNIT/ML DIALYSIS: 3000.0000 [IU] | INTRAMUSCULAR | Status: DC | PRN

## 2023-07-01 MED ORDER — ANTICOAGULANT SODIUM CITRATE 4% (200MG/5ML) IV SOLN: 5.0000 mL | Status: DC | PRN

## 2023-07-01 MED ORDER — HEPARIN SODIUM (PORCINE) 1000 UNIT/ML IJ SOLN
INTRAMUSCULAR | Status: AC
  Filled 2023-07-01: qty 3

## 2023-07-01 MED ORDER — HEPARIN SODIUM (PORCINE) 1000 UNIT/ML DIALYSIS
3000.0000 [IU] | Freq: Once | INTRAMUSCULAR | Status: AC
  Administered 2023-07-01: 3000 [IU] via INTRAVENOUS_CENTRAL

## 2023-07-01 MED ORDER — NEPRO/CARBSTEADY PO LIQD: 237.0000 mL | ORAL | Status: DC | PRN

## 2023-07-01 MED ORDER — HEPARIN SODIUM (PORCINE) 1000 UNIT/ML DIALYSIS
1000.0000 [IU] | INTRAMUSCULAR | Status: DC | PRN
Start: 2023-07-01 — End: 2023-07-01

## 2023-07-01 MED ORDER — HEPARIN SODIUM (PORCINE) 1000 UNIT/ML DIALYSIS
1000.0000 [IU] | INTRAMUSCULAR | Status: DC | PRN
  Administered 2023-07-01: 1000 [IU]

## 2023-07-01 NOTE — Progress Notes (Signed)
   07/01/23 1945  Vitals  BP 125/75  MAP (mmHg) 87  BP Location Right Arm  BP Method Automatic  Patient Position (if appropriate) Lying  Pulse Rate 93  Pulse Rate Source Monitor  ECG Heart Rate (!) 103  Resp 14  Oxygen Therapy  SpO2 100 %  O2 Device Room Air  During Treatment Monitoring  Blood Flow Rate (mL/min) 399 mL/min  Arterial Pressure (mmHg) -233.73 mmHg  Venous Pressure (mmHg) 197.16 mmHg  TMP (mmHg) 25.65 mmHg  Ultrafiltration Rate (mL/min) 1201 mL/min  Dialysate Flow Rate (mL/min) 300 ml/min  Dialysate Potassium Concentration 2  Dialysate Calcium  Concentration 2.5  Duration of HD Treatment -hour(s) 3.67 hour(s)  Cumulative Fluid Removed (mL) per Treatment  3608.54  HD Safety Checks Performed Yes  Intra-Hemodialysis Comments Progressing as prescribed  Post Treatment  Dialyzer Clearance Lightly streaked  Liters Processed 96  Fluid Removed (mL) 4000 mL  Tolerated HD Treatment Yes  AVG/AVF Arterial Site Held (minutes) 10 minutes  AVG/AVF Venous Site Held (minutes) 10 minutes  Fistula / Graft Right Forearm Arteriovenous fistula  Placement Date/Time: 07/16/17 (c) 1158   Placed prior to admission: No  Orientation: Right  Access Location: Forearm  Access Type: Arteriovenous fistula  Site Condition No complications  Fistula / Graft Assessment Thrill;Bruit;Present  Status Deaccessed  Needle Size 15  Drainage Description None

## 2023-07-01 NOTE — Progress Notes (Signed)
 Occupational Therapy Weekly Progress Note  Patient Details  Name: Carl Trudel Sr. MRN: [id number] Date of Birth: 1961/01/18  Beginning of progress report period: Jun 24, 2023 End of progress report period: Jul 01, 2023  Today's Date: 07/01/2023 OT Individual Time: 0810-0905 & 1105-1205 OT Individual Time Calculation (min): 55 min & 60 min   Patient has met 3 of 3 short term goals.  Pt has been continuously progressing with ADL tasks and functional mobility/transfers with RW. Pt waiting on SNF placement d/t inaccessible home environment, although making significant progress during IPR stay.  Patient continues to demonstrate the following deficits: muscle weakness and muscle joint tightness, decreased cardiorespiratoy endurance, unbalanced muscle activation, and decreased standing balance and decreased balance strategies and therefore will continue to benefit from skilled OT intervention to enhance overall performance with BADL, iADL, and Reduce care partner burden.  Patient progressing toward long term goals..  Continue plan of care.  OT Short Term Goals Week 4:  OT Short Term Goal 1 (Week 4): Pt will initiate completing 9HPT for demonstration of increased BUE abilities OT Short Term Goal 1 - Progress (Week 4): Met OT Short Term Goal 2 (Week 4): Pt will complete UB dressing with Min A OT Short Term Goal 2 - Progress (Week 4): Met OT Short Term Goal 3 (Week 4): Pt will complete sit to stand at Mod A consistently throughout therapy sessions OT Short Term Goal 3 - Progress (Week 4): Met Week 5:  OT Short Term Goal 1 (Week 5): Pt will complete toileting at Min A with AE as necessary OT Short Term Goal 2 (Week 5): Pt will complete bathing at [facility] with AE as necessary  Skilled Therapeutic Interventions/Progress Updates:      Therapy Documentation Precautions:  Precautions Precautions: Fall, Cervical, Other (comment) (HD MWF) Recall of Precautions/Restrictions:  Impaired Precaution/Restrictions Comments: Verbally reviewed cervical precautions. Required Braces or Orthoses: Cervical Brace Cervical Brace: Soft collar, At all times Restrictions Weight Bearing Restrictions Per Provider Order: No Session 1 General: "[name]" Pt seated in W/C upon OT arrival, agreeable to OT. Session start at 810 d/t pt receiving breakfast late  Pain: no pain reported  Exercises: OT providing skilled intervention with BUE training for increased strength/UE endurance for RW handling and ADL retraining. Pt completed 10 minutes of arm bike, 5 min forward, 5 min backward in order to increase BUE/BLEstrength and endurance in preparation for increased independence in ADLs. Rest break after 5 min, on level 6 resistance, 166 revolutions, average of 10RPM, and total of 0.2 mi. Pt reporting wanting to work on UE more often.  Other Treatments: OT discussing potential of being peer mentor to provide mentorship to individuals who have acquired SCI d/t pt willingness to empower others here in IPR. OT discussed details of role and pt was open to idea. OT aldo educating pt of SCI support group held the last Thursday of every month. OT discussed purpose of group and what meeting entail. Pt was open to joining group one D/C home.    Pt seated in W/C at end of session with W/C alarm donned, call light within reach and 4Ps assessed.     Session 2 General: "Let's go outside" Pt seated in W/C upon OT arrival, agreeable to OT. Session held outdoors for increased mood and QoL.   Pain: no pain reported  Exercises: OT providing skilled intervention with UE training with Pt completing the following exercise circuit in order to improve functional activity, strength and endurance to prepare  for ADLs such as bathing. Pt completed the following exercises in seated/reclined position with no noted LOB/SOB and 3x10 repetitions on each exercise and PRN rest breaks: -bicep curls with 3# dowel  rod -triceps extensions with 3# dowel rod -shoulder flexion with 3# dowel rod -eccentric contraction bicep curl with 4# dumbbell -modified sit ups with reclined chair with 8# dumbbells   Pt seated in W/C at end of session with W/C alarm donned, call light within reach and 4Ps assessed.     Therapy/Group: Individual Therapy  [name], OTD, OTR/L 07/01/2023, 12:29 PM

## 2023-07-01 NOTE — Progress Notes (Signed)
 Physical Therapy Session Note  Patient Details  Name: Carl Rodda Sr. MRN: [id number] Date of Birth: 1960-05-16  Today's Date: 07/01/2023 PT Individual Time: 0950-1100 PT Individual Time Calculation (min): 70 min   Short Term Goals: Week 5:  PT Short Term Goal 1 (Week 5): = LTGS due to ELOS  Skilled Therapeutic Interventions/Progress Updates:    Pt seated in w/c on arrival and agreeable to therapy. Pt reports mild L knee discomfort today, and has been having R hip pain but it is not bothersome at this time.   Pt navigated PWC at mod I level throughout session. Stand pivot transfer to nustep with light min a to stand and CGA for transfer. Nustep x 10 min on rolling hills program for global muscle strength and cardiovascular endurance. Load interval=5-15. Required 1 rest break for fatigue management. Maintained 30-40 spm throughout, avg=28 spm. Min a to stand from nustep, GGA ambulatory transfer to mat table, ~30 ft.  Sit to stand from 26" with cueing and assist to achieve adequate anterior weight shift. Pt then performed 4x10 step ups on 4" step using RW(therapist stabilized for increased confidence).  Cues for eccentric lowering when sitting for rest breaks, CGA.   Pt returned to room and remained in PWC with needs in reach.   Therapy Documentation Precautions:  Precautions Precautions: Fall, Cervical, Other (comment) (HD MWF) Recall of Precautions/Restrictions: Impaired Precaution/Restrictions Comments: Verbally reviewed cervical precautions. Required Braces or Orthoses: Cervical Brace Cervical Brace: Soft collar, At all times Restrictions Weight Bearing Restrictions Per Provider Order: No General:     Therapy/Group: Individual Therapy  Tex Filbert 07/01/2023, 10:08 AM

## 2023-07-01 NOTE — Progress Notes (Signed)
 PROGRESS NOTE   Subjective/Complaints:  Pt reports no problems transferring to w/c- and was able to compensate for w/c being high- got w/c joystick to bring w/c to correct level then sat down No complaints this AM ROS:     Pt denies SOB, abd pain, CP, N/V/C/D, and vision changes  B/L R>L knee pain (+)- R knee very improved +R hip pain-managed  Objective:   No results found.      Recent Labs    06/28/23 1500  WBC 5.3  HGB 7.8*  HCT 24.6*  PLT 278     Recent Labs    06/28/23 1500  NA 133*  K 5.3*  CL 91*  CO2 24  GLUCOSE 105*  BUN 73*  CREATININE 9.02*  CALCIUM  8.3*     Intake/Output Summary (Last 24 hours) at 07/01/2023 1038 Last data filed at 06/30/2023 1600 Gross per 24 hour  Intake 420 ml  Output --  Net 420 ml        Physical Exam: Vital Signs Blood pressure 135/85, pulse 80, temperature 97.6 F (36.4 C), resp. rate 18, height 6\' 1"  (1.854 m), weight 90.3 kg, SpO2 99%.    General: awake, alert, appropriate, transferring with RW to w/c; NT in room;  NAD HENT: conjugate gaze; oropharynx moist CV: regular rate and rhythm; no JVD Pulmonary: CTA B/L; no W/R/R- good air movement GI: soft, NT, ND, (+)BS Psychiatric: appropriate- bright affect Neurological: Ox3   PRIOR EXAMS:MSK- R lateral and groin TTP- c/w trochanteric bursitis and R joint pain  Neurological: Ox3, follows commands, cranial nerves II through XII grossly intact,   MSK- R knee much less swollen MsK: B/L knees with effusions- less TTP on R knee this AM- looks good- mild effusion , no erythema, not really focally warm to touch- Both have effusions, - R hand swelling looks good/resolved  MSK- R hand swelling esp ulnar side of hand/dorsum and L wrist-stable this AM RUE- Biceps 4/5; WE 4-/5; Triceps 2/5; Grip 2-/5 and FA 1/5 LUE- 4/5 except triceps 3+ to 4-/5 RLE- HF 1 to 2-/5; KE 2-/5; DF/PF 2-/5 LLE- HF 3/5; KE 4-/5;  DF/PF 4/5    Skin: C/D/I. Multiple lesions on BL legs with various stages of healing.  + RUE fistula - intact   MSK:      LLE knee with palpable effusion, no warmth, no ttp Mood: Pleasant affect, appropriate mood.  Neurologic exam: Alert and oriented x 4, fluent, follows commands, cranial nerves II through XII grossly intact, no hypertonia noted  Sensation: Reduced to light touch in distal right lower extremity to the mid-thigh       Strength:                RUE: 2/5 SA, 2/5 EF, 2/5 EE, 2/5 WE, 2/5 FF, 2/5 FA                LUE:  3/5 SA, 3/5 EF, 3/5 EE, 3/5 WE, 3/5 FF, 3/5 FA                RLE: 2/5 HF, 2/5 KE, 2/5  DF, 2/5  EHL, 2/5  PF  LLE:  3/5 HF, 4/5 KE, 4/5  DF, 4/5  EHL, 4/5  PF  Neuroexam stable 4/21  Assessment/Plan: 1. Functional deficits which require 3+ hours per day of interdisciplinary therapy in a comprehensive inpatient rehab setting. Physiatrist is providing close team supervision and 24 hour management of active medical problems listed below. Physiatrist and rehab team continue to assess barriers to discharge/monitor patient progress toward functional and medical goals  Care Tool:  Bathing    Body parts bathed by patient: Right arm, Left arm, Chest, Abdomen, Front perineal area, Buttocks, Face, Left lower leg, Right lower leg, Left upper leg, Right upper leg   Body parts bathed by helper: Left arm, Front perineal area, Buttocks, Right lower leg, Left lower leg     Bathing assist Assist Level: Contact Guard/Touching assist     Upper Body Dressing/Undressing Upper body dressing   What is the patient wearing?: Pull over shirt    Upper body assist Assist Level: Supervision/Verbal cueing    Lower Body Dressing/Undressing Lower body dressing      What is the patient wearing?: Pants, Underwear/pull up     Lower body assist Assist for lower body dressing: Minimal Assistance - Patient > 75%     Toileting Toileting Toileting Activity did  not occur (Clothing management and hygiene only): N/A (no void or bm) (anuric, HD patient)  Toileting assist Assist for toileting: Maximal Assistance - Patient 25 - 49%     Transfers Chair/bed transfer  Transfers assist     Chair/bed transfer assist level: Minimal Assistance - Patient > 75%     Locomotion Ambulation   Ambulation assist   Ambulation activity did not occur: Safety/medical concerns  Assist level: Minimal Assistance - Patient > 75% Assistive device: Walker-rolling Max distance: 125'   Walk 10 feet activity   Assist  Walk 10 feet activity did not occur: Safety/medical concerns  Assist level: Minimal Assistance - Patient > 75% Assistive device: Walker-rolling   Walk 50 feet activity   Assist Walk 50 feet with 2 turns activity did not occur: Safety/medical concerns  Assist level: Minimal Assistance - Patient > 75% Assistive device: Walker-rolling    Walk 150 feet activity   Assist Walk 150 feet activity did not occur: Safety/medical concerns  Assist level: 2 helpers      Walk 10 feet on uneven surface  activity   Assist Walk 10 feet on uneven surfaces activity did not occur: Safety/medical concerns         Wheelchair     Assist Is the patient using a wheelchair?: Yes Type of Wheelchair: Power    Wheelchair assist level: Independent Max wheelchair distance: 300    Wheelchair 50 feet with 2 turns activity    Assist        Assist Level: Independent   Wheelchair 150 feet activity     Assist      Assist Level: Independent   Blood pressure 135/85, pulse 80, temperature 97.6 F (36.4 C), resp. rate 18, height 6\' 1"  (1.854 m), weight 90.3 kg, SpO2 99%.  Medical Problem List and Plan: 1. 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  Antithrombotics: -DVT/anticoagulation:  Mechanical: Sequential compression devices, below knee Bilateral lower extremities             -antiplatelet therapy: N/A--no antiplatelet due to hx of recurrent GIB  4/25- d/w family- they agree that Lovenox  not appropriate at this time.   -06/09/23 ordered dopplers to r/o clots given low grade temps recently  4/28- LE Dopplers (-) for DVT 3. Pain Management:  oxycodone  prn. Gabapentin  300 mg/hs.              --has Flexeril  10 mg TID prn              - patient with intermittent, severe b/l headache - add topomax 25 mg BID    4/19- pt rating pain 07/19/08 which is "tolerable" 5/7- got Ortho to come and take fluid off R knee- got 100cc off- pain is no more than 1/10 this AM! 5/8-5/9 knee still doing well 5/13-5/14-  pain controlled- allowed him to walk further yesterday in therapy- still cannot do more than 1 step 5/16- will get R hip xray due to clunk and R hi pain -06/29/23 R hip xray with cystic changes of femoral head/neck, also degeneration on my review- will need ortho f/up but doubt need for emergent consultation. Dr. Christiane Cowing is his primary ortho 4. Mood/Behavior/Sleep: LCSW to follow for evaluation and support             -antipsychotic agents: N/A             --melatonin prn for insomnia.  -06/09/23 poor sleep, d/t interruptions, asked nursing to minimize interruptions between 11p-5a (apparently they can't put a sign up, but will communicate in huddle) 5. Neuropsych/cognition: This patient is capable of making decisions on his own behalf. 5/8- Ox3 and appropriate 6. Skin/Wound Care: Routine pressure relief measures.   4/19- due to ulcers on legs, on low air loss mattress 7. Fluids/Electrolytes/Nutrition: Strict I/O.  Daily weights. Labs with HD. 8. ESRD- HD MWF: Schedule HD at the end of the day to help with tolerance of therapy             --Sensipar  and Renvela  for metabolic bone disease.   - Patient reports no longer makes urine  5/13 - last HD yesterday 5/15- last HD yesterday=- they were not able to finish due to equipment issues- will need to monitor fluid intake today- so can make it til tomorrow- they are not doing more today.  9. Anemia of chronic disease: Monitor H/H, aranesp  5/1- Hb down to 7.3- is Jehovah's Witness, so doesn't want transfusions? 5/6- Hb still 7.3- isn't dropping more- No blood or dark stools per pt- no signs of GI loss. 5/7- pt refused labs this AM- wants to get in HD  5/8- Hb 7.1 yesterday after HD- did explained to pt like to get labs before HD so can act on them- will d/w pt again about transfusion? 5/9- Hb 7.6 in last 24 hours- done yesterday- feels better- no dizziness or fatigue- so doesn't feel transfusion is appropriate 5/12 HGB up to 8.1 , improved continue to monitor.  He is on Aranesp  per nephrology 5/15- pt needs to have CBC rechecked in HD tomorrow-  -06/29/23 Hgb 7.8, stable. Monitor.  10. H/o recurrent GIB/Jehovah's Witness: Continue Protonix  BID 11. H/o Hypotension: Has had  drop  in BP with HD. Now cervical SCI.  --Monitor for symptoms day after HD and with activity. -4/18 baseline blood pressure stable, monitor with activity 4/19- BP actually slightly elevated today- 140s-150's- con't regimen, since can drop with HD and therapy 4/20- BP actually doing better today- in 100s'110s- however HR elevated, likely to fluid overload 4/21 nephrology started midodrine  10mg  before dialysis, BP appears to be doing a little better today 5/8- BP on low side in last 24 hours- likely made worse due to HD yesterday- denies OH Sx's. Con't to monitor trend 5/9- BP is running in 90's systolic- doesn't feel dizzy or lightheaded sitting, but could drop when standing-will d/w  therapy -5/12 BP was a little elevated earlier today but appears to be improved, continue to monitor.  He has dialysis today 5/13-5/16 BP running normal to very slightly elevated, which is good, since on HD- con't regimen -5/17-19/25 BPs great, monitor Vitals:   06/28/23 1730 06/28/23 1800 06/28/23 1838 06/28/23 1842  BP: 128/87 108/73 109/64 117/69   06/28/23 2012 06/29/23 0454 06/29/23 1333 06/29/23 2027  BP: 118/73 122/79 (!) 145/80 122/68   06/30/23 0452 06/30/23 1420 06/30/23 2147 07/01/23 0622  BP: 114/75 113/83 (!) 144/86 135/85     12. Constipation: Small BM on 04/14 --had of nulytey yesterday and another today? -Xray 4/17 without excessive stool, patient reports stool still little hard yesterday.  Will increase MiraLAX  to 3 times a day, continue Senokot twice a day. Suppository PRN   06/09/23 LBM yesterday, cont regimen 4/28- LBM overnight- has been refusing miralax - made it prn- taking metamucil  5/18- LBM this morning per pt 13. ?left knee effusion. Not currently bothersome. Consider imaging.  14.  SIRS- Fever/hypotension/tachycardia - 4/21 Currently afebrile, he got to 50 cc IVF yesterday, CXR negative, EKG sinus tachycardia. Patient was started on Maxipime  and vancomycin  yesterday.  Appears to be doing much better today.  WBC not elevated at 7.1.  Continue to follow blood cultures 4/22- Blood Cx's (-) so far- WBC 7.3- will con't IV ABX until know final results of blood Cx's- CXR (-)- but temp was 103.2 yesterday overnight.  4/23- Blood C'x (-) x2 days so far- T 99.9 this Am after a few sips of coffee- was 100.4 this AM- will con't IV ABX since concerned about possible bacteremia- esp since having low grade temps still 4/24- No more fevers, even low grade- no growth on blood Cx' bu it says that didn't get enough blood, so much be falsely negative 4/25- No growth for 5 days, but it also said not enough blood to be sure was a true negative. Will change stop date til  tomorrow -06/08/23 at 3am temp recorded as 100.3 but 5am was 98.3; will get CBC w/diff, monitor temps closely today, if continued low grade temps, will reach out to ID. BCx NGTD x5d. Didn't get vanc yesterday? Or cefepime ? Unclear why. Cefepime  started this morning at 0011. -06/09/23 temp 100.3 at 3am yesterday and then 100 later in the day, but no fevers since, no tachycardia.  -WBC yesterday 8.9.  -Now with some confusion; spoke with Dr. Levern Reader of ID, stated cefepime  could be the cause of the confusion -recommended we stop all abx (which ended yesterday anyway) -watch the patient OFF abx -if fevers >100.3 occur then she recommended repeat BCx and CXR and could call her back to see if any abx would be advised at that time.  -Head CT and dopplers ordered as well, as above 4/28- CT  looks good- no fever, WBC is OK- off ABX 4/29- Also Dopplers (-) feeling good this AM 06/16/23 seems to have resolved. 15. Tachycardia- resolved 4/22- doing better- right at 100 this AM- will monitor- likely can be from dregs of infection vs OH. -will monitor  4/23- Tachycardia improving- 4/24-27 HR in 90's right now- doing better  4/29- 4/30- HR running in 90s-low 100's today 5/12 heart rate in the 70s and 80s primarily, continue to monitor    07/01/2023    6:22 AM 07/01/2023    5:00 AM 06/30/2023    9:47 PM  Vitals with BMI  Weight  199 lbs 1 oz   Systolic 135  144  Diastolic 85  86  Pulse 80  97    16. R hand swelling  4/23- doing ice- will do xrays 4/24- Xray doesn't look like cause of swelling, however could be due to new shunt? 4/30- resolved 17. Hypocalcemia 4/24- his Albumin  is 2.1, so probably ok, and phos coming down- will leave to renal- thank you for their help  5/1- Ca 9.3 5/8- Ca 8.8 5/15- Ca 8.5 18. Hyperkalemia 4/28- K+ 6.3 today- got hypoosmotic bath to reduce K+ per note from RN/HD nurse? Also has Na of 127- being addressed by HD/renal- Phos also 5.8- but again, getting HD today 5/1- K+  down to 5.6 and Na 129 5/6- Na 133 and K+ down to 4.7 from 5.6 5/7- refused labs this AM- wants in HD 5/9- K_ came back as 4.5 5/12 potassium elevated today, chronic issue has dialysis today.  Treatment per renal team 5/14- K+ down to 5.7- from 6.2- has HD today 06/29/23 down to 5.3 yesterday, had dialysis yesterday.  5/19- HD today 19. R>L knee pain 4/29- will ask Ortho if they will tap his R knee- I have no problem doing injection of knee, however with so much fluid, I would rather Ortho tap him and then do steroid injection.   4/30- we added Prednisone  40 mg daily x 3 days- to see if helps knee pain  5/1- mild reduction in pain so far 5/6- Pt has short term improvement from Prednisone  dosing for knees- con't percocet prn 5/7- since pt was doing so much worse with therapy, asked Ortho to come and tap knee and take off fluid- do steroid injection- they drew off 100CC- 5 different draws required- fluid is pending, but has a lot of WBCs- no organisms on gram stain.   5/8- no growth for 24 hours- R knee pain so much better  -06/22/23 NGTD x3 days, monitor  5/14- No growth  - pain so much better after 100cc fluid removed and got injection 20.  R hip pain 5/16- will get R hip xray- also c/w trochanteric bursitis on exam- if doesn't improve, will do trochanteric bursa injection Tuesday -06/29/23 R hip xray with cystic changes of femoral head/neck, also degeneration on my review- will need ortho f/up but doubt need for emergent consultation. Dr. Christiane Cowing is his primary ortho.     LOS: 32 days A FACE TO FACE EVALUATION WAS PERFORMED  [name] 07/01/2023, 10:38 AM

## 2023-07-01 NOTE — Progress Notes (Addendum)
 [name] Progress Note   Subjective:   Seen on HD today. Reports feeling well today, no concerns. Denies SOB, CP, dizziness.   Objective Vitals:   07/01/23 0622 07/01/23 1302 07/01/23 1535 07/01/23 1558  BP: 135/85 122/71 (!) 141/84 (!) 153/85  Pulse: 80 81 80 68  Resp: 18 18 12  (!) 8  Temp: 97.6 F (36.4 C) 97.6 F (36.4 C) 97.9 F (36.6 C)   TempSrc:      SpO2: 99% 100% 100% 100%  Weight:   93 kg   Height:       Physical Exam General: alert male in NAD Heart: RRR, no murmurs, rubs or gallops Lungs: CTA bilaterally, respirations unlabored, room air Abdomen: Soft, non-distended, +BS Extremities: trace edema bilaterally Dialysis Access: RUE AVF +t/b  Additional Objective Labs: Basic Metabolic Panel: Recent Labs  Lab 06/25/23 2107 06/26/23 1558 06/28/23 1500  NA 133* 132* 133*  K 5.7* 5.5* 5.3*  CL 94* 94* 91*  CO2 26 23 24   GLUCOSE 109* 112* 105*  BUN 69* 85* 73*  CREATININE 8.18* 9.17* 9.02*  CALCIUM  8.5* 8.4* 8.3*  PHOS  --  5.0* 5.0*   Liver Function Tests: Recent Labs  Lab 06/26/23 1558 06/28/23 1500  ALBUMIN  2.4* 2.5*   No results for input(s): "LIPASE", "AMYLASE" in the last 168 hours. CBC: Recent Labs  Lab 06/28/23 1500  WBC 5.3  HGB 7.8*  HCT 24.6*  MCV 89.8  PLT 278   Blood Culture    Component Value Date/Time   SDES FLUID 06/18/2023 1357   SPECREQUEST NONE 06/18/2023 1357   CULT  06/18/2023 1357    NO GROWTH 3 DAYS Performed at [facility], [address] [place], [place] [address]    REPTSTATUS 06/22/2023 FINAL 06/18/2023 1357    Cardiac Enzymes: No results for input(s): "CKTOTAL", "CKMB", "CKMBINDEX", "TROPONINI" in the last 168 hours. CBG: No results for input(s): "GLUCAP" in the last 168 hours. Iron Studies: No results for input(s): "IRON", "TIBC", "TRANSFERRIN", "FERRITIN" in the last 72 hours. @lablastinr3 @ Studies/Results: No results found. Medications:  anticoagulant sodium citrate       anticoagulant sodium citrate      anticoagulant sodium citrate      anticoagulant sodium citrate       (feeding supplement) PROSource Plus  30 mL Oral BID BM   atorvastatin   10 mg Oral Q Thu   carbamide peroxide  5 drop Right EAR BID   Chlorhexidine  Gluconate Cloth  6 each Topical Q0600   Chlorhexidine  Gluconate Cloth  6 each Topical Q0600   cinacalcet   120 mg Oral Q supper   darbepoetin (ARANESP ) injection - DIALYSIS  150 mcg Subcutaneous Q Tue-1800   gabapentin   300 mg Oral QHS   Gerhardt's butt cream   Topical BID   pantoprazole   40 mg Oral BID AC   psyllium  1 packet Oral TID   senna-docusate  2 tablet Oral BID   sevelamer  carbonate  3,200 mg Oral TID WC   sodium zirconium cyclosilicate   10 g Oral Daily   topiramate   25 mg Oral BID    Dialysis Orders: OP Dialysis Orders [facility] - MWF 4h  B400   85.5kg   2K bath  AVF   - Heparin  3000 units   - Mircera 60mcg IV q 2 weeks- last dose 05/24/23  - Calcitriol  1.5mcg PO q HD  Assessment/Plan: 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 Next HD  07/03/23 Hyperkalemia:Chronic issue.  On Lokelma  daily now. Continue to reinforce renal diet. K 5.3  HTN/volume: BP controlled/stable, still above EDW with some edema on exam, push UFG as tolerated. Tolerates 4-4.5L UF goals Anemia of ESRD: Hgb 7.8, Arranesp increased to 150 q Tues -last 5/13. Getting updated CBC.  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 Secondary HPTH: CorrCa controlled, phos above goal, continue sevelamer  and sensipar  Nutrition: Alb 2.5, continue protein supplements. Patient reports appetite is better  Carl Lorenzo, NP-C 07/01/2023, 4:24 PM  [name]   Seen and examined independently.  Agree with note and exam as documented above by physician extender and as noted here.  Seen and examined on dialysis.  Blood pressure 117/78 and HR 101.   Tolerating goal.  Procedure supervised.  Right AVF in use.  State was told that he will need a hip replacement.    [name], MD 07/01/2023  6:32 PM

## 2023-07-01 NOTE — Telephone Encounter (Signed)
 His right hip is pretty degenerative but he can't have a hip replacement while he's hospitalized.

## 2023-07-01 NOTE — Plan of Care (Signed)
  Problem: Consults Goal: RH SPINAL CORD INJURY PATIENT EDUCATION Description:  See Patient Education module for education specifics.  Outcome: Progressing   Problem: SCI BOWEL ELIMINATION Goal: RH STG MANAGE BOWEL WITH ASSISTANCE Description: STG Manage Bowel with minimal Assistance. Outcome: Progressing Goal: RH STG SCI MANAGE BOWEL WITH MEDICATION WITH ASSISTANCE Description: STG SCI Manage bowel with medication with assistance. Outcome: Progressing   Problem: RH SKIN INTEGRITY Goal: RH STG SKIN FREE OF INFECTION/BREAKDOWN Description: Manage skin free of infection/breakdown with minimal assistance Outcome: Progressing   Problem: RH SAFETY Goal: RH STG ADHERE TO SAFETY PRECAUTIONS W/ASSISTANCE/DEVICE Description: STG Adhere to Safety Precautions With minimal Assistance/Device. Outcome: Progressing   Problem: RH PAIN MANAGEMENT Goal: RH STG PAIN MANAGED AT OR BELOW PT'S PAIN GOAL Description: Pain<4 w/ prns Outcome: Progressing   Problem: RH KNOWLEDGE DEFICIT SCI Goal: RH STG INCREASE KNOWLEDGE OF SELF CARE AFTER SCI Description: Manage increase knowledge of self care after SCI with minimal assistance using educational materials provided to son Outcome: Progressing

## 2023-07-01 NOTE — Telephone Encounter (Signed)
 Pt request Dr Christiane Cowing look at the xrays of his right hip that was take while he's inpatient at the hospital. Pt states he's having severe right hip pain and wants Dr Christiane Cowing option if he needs to go with hip surgery.

## 2023-07-01 NOTE — Telephone Encounter (Signed)
 Called and relayed to patient. He will let us  know once he is home and doing better.

## 2023-07-02 MED ORDER — CHLORHEXIDINE GLUCONATE CLOTH 2 % EX PADS: 6.0000 | MEDICATED_PAD | Freq: Every day | CUTANEOUS | Status: DC

## 2023-07-02 NOTE — Progress Notes (Signed)
 Physical Therapy Session Note  Patient Details  Name: Carl Kope Sr. MRN: [id number] Date of Birth: 1960/12/25  Today's Date: 07/02/2023 PT Individual Time: 0939-1009 PT Individual Time Calculation (min): 30 min   Short Term Goals: Week 5:  PT Short Term Goal 1 (Week 5): = LTGS due to ELOS  Skilled Therapeutic Interventions/Progress Updates: Patient sitting in PWC on entrance to room. Patient alert and agreeable to PT session.   Patient with no complaints of pain  Therapeutic Activity: Transfers: Pt performed sit<>stands to prepare for ambulation during session with overall modA from hi/low mat level, and minA from PWC height that was elevated. Provided VC for anterior weight shift.  - Pt navigated PWC from room<laundry room, then from main gym<room at end of session. Pt required assistance to move clothes from washer (pt forward flexed to retreive clothes from top washer door, and placed on top of dryer for PTA to place in side door of dryer - assisted for time management and laundry room set up). - Pt ambulated roughly 30' x 1 from laundry room<day room, and 190' from day room gym<main gym after sitting rest break. Pt light CGA/close supervision with several short standing rest breaks required, and VC to increase upright standing posture and forward gaze vs downward presentation.   Patient sitting in PWC at end of session with brakes locked, and all needs within reach.      Therapy Documentation Precautions:  Precautions Precautions: Fall, Cervical, Other (comment) (HD MWF) Recall of Precautions/Restrictions: Impaired Precaution/Restrictions Comments: Verbally reviewed cervical precautions. Required Braces or Orthoses: Cervical Brace Cervical Brace: Soft collar, At all times Restrictions Weight Bearing Restrictions Per Provider Order: No   Therapy/Group: Individual Therapy  Dexton Zwilling PTA 07/02/2023, 12:43 PM

## 2023-07-02 NOTE — Progress Notes (Signed)
 Occupational Therapy Session Note  Patient Details  Name: Carl Crewe Sr. MRN: [id number] Date of Birth: [birth year]  Today's Date: 07/02/2023 OT Individual Time: 0810-0910 & 1300-1345 OT Individual Time Calculation (min): 60 min & 45 min   Short Term Goals: Week 5:  OT Short Term Goal 1 (Week 5): Pt will complete toileting at [facility] with AE as necessary OT Short Term Goal 2 (Week 5): Pt will complete bathing at [facility] with AE as necessary  Skilled Therapeutic Interventions/Progress Updates:      Therapy Documentation Precautions:  Precautions Precautions: Fall, Cervical, Other (comment) (HD MWF) Recall of Precautions/Restrictions: Impaired Precaution/Restrictions Comments: Verbally reviewed cervical precautions. Required Braces or Orthoses: Cervical Brace Cervical Brace: Soft collar, At all times Restrictions Weight Bearing Restrictions Per Provider Order: No Session 1 General: "There she is!" Pt seated in W/C upon OT arrival, agreeable to OT.  Pain: no pain reported  ADL: OT providing skilled intervention for IADL retraining such as laundry. Pt stood frm W/C with RW at [facility], able to ambulate ~5 ft to washer, reach out of BOS to gather clothing and load washer with detergent. OT then providing intervention with functional mobility to therapy gym ~50 ft with RW at [facility], after session pt able to ambulate from therapy gym>room ~120 ft with no LOB/SOB  Exercises: OT providing skilled intervention in completing the following exercise circuit in order to improve functional activity, strength and endurance to prepare for ADLs such as bathing. Pt completed the following exercises in seated/supine position with no noted LOB/SOB and 3x10 repetitions on each exercise: -shoulder flexion in supine with 3# dowel rod -sit ups -chest press with 3# dowel rod    Pt seated in W/C at end of session with W/C alarm donned, call light within reach and 4Ps assessed.    Session 2 General: "See you  tomorrow" Pt seated in W/C upon OT arrival, agreeable to OT.  Pain: no pain reported  Balance: OT providing skilled intervention on dynamic seated activities in order to promote increased balance strategies and UB strength/endurance with ADL participation and functional transfers. Pt completed all activities at seated level with higher level balance activities with unsupported sitting in OWC. Pt completed exercises listed below: -toss/hitting ball with 5# dowel rod -seated rows with 5# dowel rod -bicep curls with 5# dowel rod   Pt seated in W/C at end of session with W/C alarm donned, call light within reach and 4Ps assessed.    Therapy/Group: Individual Therapy  [name], OTD, OTR/L 07/02/2023, 4:33 PM

## 2023-07-02 NOTE — Progress Notes (Addendum)
 Advised by CSW that pt plans to go to [place] at d/c from rehab. SNF requesting a later appt time for out-pt HD. Contacted [facility] to see if clinic has a later appt time. SNF can do 7:00 or 7:30 but not 5:25 am chair time. Will await response from clinic. Will assist as needed.   Lauraine Polite Renal Navigator [phone]  Addendum at 2:22 pm: [facility] can offer pt a MWF 9:45 am chair time. Pt will need to arrive at 9:20 am and will be ready for pick-up by snf at 2:15 pm. This info was provided to rehab CSW who confirmed with snf that they could accommodate this later time. Clinic advised snf agreeable to this later time and that pt should resume care at clinic Wed., May 28.

## 2023-07-02 NOTE — Progress Notes (Signed)
 Physical Therapy Session Note  Patient Details  Name: Carl Hippe Sr. MRN: [id number] Date of Birth: Aug 25, 1960  Today's Date: 07/02/2023 PT Individual Time: 1300-1400 PT Individual Time Calculation (min): 60 min   Short Term Goals: Week 4:  PT Short Term Goal 1 (Week 4): Pt will consistantly be able to perform sit <> stands with max A PT Short Term Goal 1 - Progress (Week 4): Met (if elevated surface) PT Short Term Goal 2 (Week 4): Pt will be able to perform gait x 100' with mod A PT Short Term Goal 2 - Progress (Week 4): Met PT Short Term Goal 3 (Week 4): Pt will be able to perform bed mobility with supervision PT Short Term Goal 3 - Progress (Week 4): Met  Skilled Therapeutic Interventions/Progress Updates:    pt received in PWC and agreeable to therapy. No complaint of pain. Pt navigated PWC mod I throughout session.   Session focused on building LE quad and glute strength for improved independence and access to lower surfaces. Pt performed step ups on 6" step with BUE support 4 x 6-8 with CGA-min a for RLE knee block for safety. X1 instance of mild buckle, no increased assist.  Pt then performed mini squats in // bars  4 x 8 with emphasis on eccentric control. Cues for initiating Sit to stand with feet closer to chair for improved mechanics and anterior weight shift. Pt remained in w/c with needs in reach at end of session.   Therapy Documentation Precautions:  Precautions Precautions: Fall, Cervical, Other (comment) (HD MWF) Recall of Precautions/Restrictions: Impaired Precaution/Restrictions Comments: Verbally reviewed cervical precautions. Required Braces or Orthoses: Cervical Brace Cervical Brace: Soft collar, At all times Restrictions Weight Bearing Restrictions Per Provider Order: No General:        Therapy/Group: Individual Therapy  Tex Filbert 07/02/2023, 4:05 PM

## 2023-07-02 NOTE — Progress Notes (Signed)
 PROGRESS NOTE   Subjective/Complaints:  Pt has no complaints this AM- asking about labs- went over results with pt Pt also asking about plan for dispo- and reports lBM yesterday. Walking to bathroom to go with RW ROS:    Pt denies SOB, abd pain, CP, N/V/C/D, and vision changes   B/L R>L knee pain (+)- R knee very improved +R hip pain-managed but severe arthritis  Objective:   No results found.      Recent Labs    07/01/23 1537  WBC 5.8  HGB 8.0*  HCT 25.0*  PLT 260     Recent Labs    07/01/23 1537  NA 132*  K 5.3*  CL 94*  CO2 22  GLUCOSE 90  BUN 78*  CREATININE 9.69*  CALCIUM  7.6*     Intake/Output Summary (Last 24 hours) at 07/02/2023 1610 Last data filed at 07/02/2023 0806 Gross per 24 hour  Intake 720 ml  Output 4000 ml  Net -3280 ml        Physical Exam: Vital Signs Blood pressure 105/77, pulse (!) 102, temperature 98.6 F (37 C), temperature source Oral, resp. rate 17, height 6\' 1"  (1.854 m), weight 87.4 kg, SpO2 100%.     General: awake, alert, appropriate, NAD HENT: conjugate gaze; oropharynx moist CV: regular rhythm, mildly tachycardic rate; no JVD Pulmonary: CTA B/L; no W/R/R- good air movement GI: soft, NT, ND, (+)BS Psychiatric: appropriate- bright affect Neurological: Ox3   PRIOR EXAMS:MSK- R lateral and groin TTP- c/w trochanteric bursitis and R joint pain  Neurological: Ox3, follows commands, cranial nerves II through XII grossly intact,   MSK- R knee much less swollen MsK: B/L knees with effusions- less TTP on R knee this AM- looks good- mild effusion , no erythema, not really focally warm to touch- Both have effusions, - R hand swelling looks good/resolved  MSK- R hand swelling esp ulnar side of hand/dorsum and L wrist-stable this AM RUE- Biceps 4/5; WE 4-/5; Triceps 2/5; Grip 2-/5 and FA 1/5 LUE- 4/5 except triceps 3+ to 4-/5 RLE- HF 1 to 2-/5; KE 2-/5;  DF/PF 2-/5 LLE- HF 3/5; KE 4-/5; DF/PF 4/5    Skin: C/D/I. Multiple lesions on BL legs with various stages of healing.  + RUE fistula - intact   MSK:      LLE knee with palpable effusion, no warmth, no ttp Mood: Pleasant affect, appropriate mood.  Neurologic exam: Alert and oriented x 4, fluent, follows commands, cranial nerves II through XII grossly intact, no hypertonia noted  Sensation: Reduced to light touch in distal right lower extremity to the mid-thigh       Strength:                RUE: 2/5 SA, 2/5 EF, 2/5 EE, 2/5 WE, 2/5 FF, 2/5 FA                LUE:  3/5 SA, 3/5 EF, 3/5 EE, 3/5 WE, 3/5 FF, 3/5 FA                RLE: 2/5 HF, 2/5 KE, 2/5  DF, 2/5  EHL, 2/5  PF  LLE:  3/5 HF, 4/5 KE, 4/5  DF, 4/5  EHL, 4/5  PF  Neuroexam stable 4/21  Assessment/Plan: 1. Functional deficits which require 3+ hours per day of interdisciplinary therapy in a comprehensive inpatient rehab setting. Physiatrist is providing close team supervision and 24 hour management of active medical problems listed below. Physiatrist and rehab team continue to assess barriers to discharge/monitor patient progress toward functional and medical goals  Care Tool:  Bathing    Body parts bathed by patient: Right arm, Left arm, Chest, Abdomen, Front perineal area, Buttocks, Face, Left lower leg, Right lower leg, Left upper leg, Right upper leg   Body parts bathed by helper: Left arm, Front perineal area, Buttocks, Right lower leg, Left lower leg     Bathing assist Assist Level: Contact Guard/Touching assist     Upper Body Dressing/Undressing Upper body dressing   What is the patient wearing?: Pull over shirt    Upper body assist Assist Level: Supervision/Verbal cueing    Lower Body Dressing/Undressing Lower body dressing      What is the patient wearing?: Pants, Underwear/pull up     Lower body assist Assist for lower body dressing: Minimal Assistance - Patient > 75%      Toileting Toileting Toileting Activity did not occur (Clothing management and hygiene only): N/A (no void or bm) (anuric, HD patient)  Toileting assist Assist for toileting: Moderate Assistance - Patient 50 - 74%     Transfers Chair/bed transfer  Transfers assist     Chair/bed transfer assist level: Minimal Assistance - Patient > 75%     Locomotion Ambulation   Ambulation assist   Ambulation activity did not occur: Safety/medical concerns  Assist level: Minimal Assistance - Patient > 75% Assistive device: Walker-rolling Max distance: 125'   Walk 10 feet activity   Assist  Walk 10 feet activity did not occur: Safety/medical concerns  Assist level: Minimal Assistance - Patient > 75% Assistive device: Walker-rolling   Walk 50 feet activity   Assist Walk 50 feet with 2 turns activity did not occur: Safety/medical concerns  Assist level: Minimal Assistance - Patient > 75% Assistive device: Walker-rolling    Walk 150 feet activity   Assist Walk 150 feet activity did not occur: Safety/medical concerns  Assist level: 2 helpers      Walk 10 feet on uneven surface  activity   Assist Walk 10 feet on uneven surfaces activity did not occur: Safety/medical concerns         Wheelchair     Assist Is the patient using a wheelchair?: Yes Type of Wheelchair: Power    Wheelchair assist level: Independent Max wheelchair distance: 300    Wheelchair 50 feet with 2 turns activity    Assist        Assist Level: Independent   Wheelchair 150 feet activity     Assist      Assist Level: Independent   Blood pressure 105/77, pulse (!) 102, temperature 98.6 F (37 C), temperature source Oral, resp. rate 17, height 6\' 1"  (1.854 m), weight 87.4 kg, SpO2 100%.  Medical Problem List and Plan: 1. 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  Antithrombotics: -DVT/anticoagulation:  Mechanical: Sequential compression devices, below knee Bilateral lower extremities             -antiplatelet therapy: N/A--no antiplatelet due to hx of recurrent GIB  4/25- d/w family- they agree that Lovenox  not appropriate at this time.   -06/09/23 ordered dopplers to r/o clots given low grade temps recently  4/28- LE Dopplers (-) for DVT 3. Pain Management:  oxycodone  prn. Gabapentin  300 mg/hs.              --has Flexeril  10 mg TID prn              - patient with intermittent, severe b/l headache - add topomax 25 mg BID    4/19- pt rating pain 07/19/08 which is "tolerable" 5/7- got Ortho to come and take fluid off R knee- got 100cc off- pain is no more than 1/10 this AM! 5/8-5/9 knee still doing well 5/13-5/14-  pain controlled- allowed him to walk further yesterday in therapy- still cannot do more than 1 step 5/16- will get R hip xray due to clunk and R hi pain -06/29/23 R hip xray with cystic changes of femoral head/neck, also degeneration on my review- will need ortho f/up but doubt need for emergent consultation. Dr. Christiane Cowing is his primary ortho 5/20- Pt's Ortho responded and knows about Deg arthritis in R hip- but cannot address while in hospital  4. Mood/Behavior/Sleep: LCSW to follow for evaluation and support             -antipsychotic agents: N/A             --melatonin prn for insomnia.  -06/09/23 poor sleep, d/t interruptions, asked nursing to minimize interruptions between 11p-5a (apparently they can't put a sign up, but will communicate in huddle) 5.  Neuropsych/cognition: This patient is capable of making decisions on his own behalf. 5/8- Ox3 and appropriate 6. Skin/Wound Care: Routine pressure relief measures.   4/19- due to ulcers on legs, on low air loss mattress 7. Fluids/Electrolytes/Nutrition: Strict I/O. Daily weights. Labs with HD. 8. ESRD- HD MWF: Schedule HD at the end of the day to help with tolerance of therapy             --Sensipar  and Renvela  for metabolic bone disease.   - Patient reports no longer makes urine  5/13 - last HD yesterday 5/15- last HD yesterday=- they were not able to finish due to equipment issues- will need to monitor fluid intake today- so can make it til tomorrow- they are not doing more today.  9. Anemia of chronic disease: Monitor H/H, aranesp  5/9- Hb 7.6 in last 24 hours- done yesterday- feels better- no dizziness or fatigue- so doesn't feel transfusion is appropriate 5/12 HGB up to 8.1 , improved continue to monitor.  He is on Aranesp  per nephrology 5/15- pt needs to have CBC rechecked in HD tomorrow-  -06/29/23 Hgb 7.8, stable. Monitor.  5/20- Hb 8.0 10. H/o recurrent GIB/Jehovah's Witness: Continue Protonix  BID 11. H/o Hypotension: Has had drop  in BP with HD. Now cervical SCI.  --Monitor for symptoms day after HD and with activity. -4/18 baseline blood pressure stable, monitor with activity 4/19- BP actually slightly elevated today- 140s-150's- con't  regimen, since can drop with HD and therapy 4/20- BP actually doing better today- in 100s'110s- however HR elevated, likely to fluid overload 4/21 nephrology started midodrine  10mg  before dialysis, BP appears to be doing a little better today 5/8- BP on low side in last 24 hours- likely made worse due to HD yesterday- denies OH Sx's. Con't to monitor trend 5/9- BP is running in 90's systolic- doesn't feel dizzy or lightheaded sitting, but could drop when standing-will d/w therapy -5/20- BP doing well- con't to monitor Vitals:   07/01/23 1535  07/01/23 1558 07/01/23 1630 07/01/23 1700  BP: (!) 141/84 (!) 153/85 118/71 128/76   07/01/23 1730 07/01/23 1800 07/01/23 1831 07/01/23 1900  BP: (!) 148/83 119/80 117/78 131/77   07/01/23 1930 07/01/23 1945 07/01/23 2132 07/02/23 0319  BP: 121/79 125/75 114/81 105/77     12. Constipation: Small BM on 04/14 --had of nulytey yesterday and another today? -Xray 4/17 without excessive stool, patient reports stool still little hard yesterday.  Will increase MiraLAX  to 3 times a day, continue Senokot twice a day. Suppository PRN   06/09/23 LBM yesterday, cont regimen 4/28- LBM overnight- has been refusing miralax - made it prn- taking metamucil  5/18- LBM this morning per pt  5/20- LBM yesterday 13. ?left knee effusion. Not currently bothersome. Consider imaging.  14.  SIRS- Fever/hypotension/tachycardia - 4/21 Currently afebrile, he got to 50 cc IVF yesterday, CXR negative, EKG sinus tachycardia. Patient was started on Maxipime  and vancomycin  yesterday.  Appears to be doing much better today.  WBC not elevated at 7.1.  Continue to follow blood cultures 4/22- Blood Cx's (-) so far- WBC 7.3- will con't IV ABX until know final results of blood Cx's- CXR (-)- but temp was 103.2 yesterday overnight.  4/23- Blood C'x (-) x2 days so far- T 99.9 this Am after a few sips of coffee- was 100.4 this AM- will con't IV ABX since concerned about possible bacteremia- esp since having low grade temps still 4/24- No more fevers, even low grade- no growth on blood Cx' bu it says that didn't get enough blood, so much be falsely negative 4/25- No growth for 5 days, but it also said not enough blood to be sure was a true negative. Will change stop date til tomorrow -06/08/23 at 3am temp recorded as 100.3 but 5am was 98.3; will get CBC w/diff, monitor temps closely today, if continued low grade temps, will reach out to ID. BCx NGTD x5d. Didn't get vanc yesterday? Or cefepime ? Unclear why. Cefepime  started this morning  at 0011. -06/09/23 temp 100.3 at 3am yesterday and then 100 later in the day, but no fevers since, no tachycardia.  -WBC yesterday 8.9.  -Now with some confusion; spoke with Dr. Levern Reader of ID, stated cefepime  could be the cause of the confusion -recommended we stop all abx (which ended yesterday anyway) -watch the patient OFF abx -if fevers >100.3 occur then she recommended repeat BCx and CXR and could call her back to see if any abx would be advised at that time.  -Head CT and dopplers ordered as well, as above 4/28- CT looks good- no fever, WBC is OK- off ABX 4/29- Also Dopplers (-) feeling good this AM 06/16/23 seems to have resolved. 15. Tachycardia- resolved 4/22- doing better- right at 100 this AM- will monitor- likely can be from dregs of infection vs OH. -will monitor  4/23- Tachycardia improving- 4/24-27 HR in 90's right now- doing better  4/29- 4/30- HR running in  90s-low 100's today 5/12 heart rate in the 70s and 80s primarily, continue to monitor 5/20- HR back into low 100's- but is feeling good- denies Sx's.     07/02/2023    3:21 AM 07/02/2023    3:19 AM 07/01/2023    9:32 PM  Vitals with BMI  Weight 192 lbs 11 oz    Systolic  105 114  Diastolic  77 81  Pulse  102 103    16. R hand swelling  4/23- doing ice- will do xrays 4/24- Xray doesn't look like cause of swelling, however could be due to new shunt? 4/30- resolved 17. Hypocalcemia 4/24- his Albumin  is 2.1, so probably ok, and phos coming down- will leave to renal- thank you for their help  5/1- Ca 9.3 5/8- Ca 8.8 5/15- Ca 8.5 18. Hyperkalemia 4/28- K+ 6.3 today- got hypoosmotic bath to reduce K+ per note from RN/HD nurse? Also has Na of 127- being addressed by HD/renal- Phos also 5.8- but again, getting HD today 5/1- K+ down to 5.6 and Na 129 5/6- Na 133 and K+ down to 4.7 from 5.6 5/7- refused labs this AM- wants in HD 5/9- K_ came back as 4.5 5/12 potassium elevated today, chronic issue has dialysis today.   Treatment per renal team 5/14- K+ down to 5.7- from 6.2- has HD today 06/29/23 down to 5.3 yesterday, had dialysis yesterday.  5/19- HD today 5/20- K+ stayed at 5.3- doing better 19. R>L knee pain 4/29- will ask Ortho if they will tap his R knee- I have no problem doing injection of knee, however with so much fluid, I would rather Ortho tap him and then do steroid injection.   4/30- we added Prednisone  40 mg daily x 3 days- to see if helps knee pain  5/1- mild reduction in pain so far 5/6- Pt has short term improvement from Prednisone  dosing for knees- con't percocet prn 5/7- since pt was doing so much worse with therapy, asked Ortho to come and tap knee and take off fluid- do steroid injection- they drew off 100CC- 5 different draws required- fluid is pending, but has a lot of WBCs- no organisms on gram stain.   5/8- no growth for 24 hours- R knee pain so much better  -06/22/23 NGTD x3 days, monitor  5/14- No growth  - pain so much better after 100cc fluid removed and got injection 20.  R hip pain 5/16- will get R hip xray- also c/w trochanteric bursitis on exam- if doesn't improve, will do trochanteric bursa injection Tuesday -06/29/23 R hip xray with cystic changes of femoral head/neck, also degeneration on my review- will need ortho f/up but doubt need for emergent consultation. Dr. Christiane Cowing is his primary ortho.  5/20- Dr Christiane Cowing wrote that he has severe arthritis, but needs to be out of hospital to get hip replacement.     I spent a total of 36   minutes on total care today- >50% coordination of care- due to  [facility] over labs and then went over with pt- also reviewed vitals/esp HR; and labs from yesterday- also team conference to f/u on progress and dispo  LOS: 33 days A FACE TO FACE EVALUATION WAS PERFORMED  [name] 07/02/2023, 9:22 AM

## 2023-07-02 NOTE — Progress Notes (Addendum)
 Patient ID: Carl Epple Sr., male   DOB: Sep 05, 1960, 62 y.o.   MRN: [id number]  Continue to look for SNF bed and have reached out to [facility] pt's choice since close to home and to his OP-HD center. [facility] is seeing if can take a HD pt and if can go to the center he goes too, will get back with worker  12:47 PM [facility] can take pt if can bring renvela  from home like he has done here for hospital to give too. Can not take until Monday or Tuesday. Will reach out to [facility]-renal navigator to set up OP-HD was going [facility] Need to be later than 5:15 am facility would like 7 or 7;30 am. Will work on Firefighter and coordinating discharge plan.  1:49 PM Sherrlyn Dolores has gotten pt in to [facility] 9:45 chair slot be there at 9:20 and then be finished by 2:15 pm. Will have bed for him next Tuesday. Will work on Firefighter for transfer on Tuesday 5/27. Pt aware and is agreement with this plan

## 2023-07-03 LAB — GLUCOSE, CAPILLARY: Glucose-Capillary: 147 mg/dL — ABNORMAL HIGH (ref 70–99)

## 2023-07-03 MED ORDER — DARBEPOETIN ALFA 200 MCG/0.4ML IJ SOSY
200.0000 ug | PREFILLED_SYRINGE | INTRAMUSCULAR | Status: DC
  Administered 2023-07-09: 200 ug via SUBCUTANEOUS
  Filled 2023-07-03: qty 0.4

## 2023-07-03 NOTE — Progress Notes (Signed)
 PROGRESS NOTE   Subjective/Complaints:  Pt  reports breakfast is late this AM; Has HD today.  Going to [facility] next week- likely 5/27- to start HD back at outpt clinic on 5/28-   Pt concerned about going, but realizes it's time to go, feels better that will see me in office after d/c.  ROS:    Pt denies SOB, abd pain, CP, N/V/C/D, and vision changes    B/L R>L knee pain (+)- R knee very improved +R hip pain-managed but severe arthritis  Objective:   No results found.      Recent Labs    07/01/23 1537  WBC 5.8  HGB 8.0*  HCT 25.0*  PLT 260     Recent Labs    07/01/23 1537  NA 132*  K 5.3*  CL 94*  CO2 22  GLUCOSE 90  BUN 78*  CREATININE 9.69*  CALCIUM  7.6*     Intake/Output Summary (Last 24 hours) at 07/03/2023 0829 Last data filed at 07/02/2023 1834 Gross per 24 hour  Intake 980 ml  Output --  Net 980 ml        Physical Exam: Vital Signs Blood pressure 131/75, pulse 79, temperature 98.2 F (36.8 C), temperature source Oral, resp. rate 18, height 6\' 1"  (1.854 m), weight 87.4 kg, SpO2 98%.      General: awake, alert, appropriate, sitting up in w/c; cleaning up room; NAD HENT: conjugate gaze; oropharynx moist CV: regular rate and rhythm; no JVD Pulmonary: CTA B/L; no W/R/R- good air movement GI: soft, NT, ND, (+)BS Psychiatric: appropriate- bright affect Neurological: Ox3  PRIOR EXAMS:MSK- R lateral and groin TTP- c/w trochanteric bursitis and R joint pain  Neurological: Ox3, follows commands, cranial nerves II through XII grossly intact,   MSK- R knee much less swollen MsK: B/L knees with effusions- less TTP on R knee this AM- looks good- mild effusion , no erythema, not really focally warm to touch- Both have effusions, - R hand swelling looks good/resolved  MSK- R hand swelling esp ulnar side of hand/dorsum and L wrist-stable this AM RUE- Biceps 4/5; WE 4-/5; Triceps  2/5; Grip 2-/5 and FA 1/5 LUE- 4/5 except triceps 3+ to 4-/5 RLE- HF 1 to 2-/5; KE 2-/5; DF/PF 2-/5 LLE- HF 3/5; KE 4-/5; DF/PF 4/5    Skin: C/D/I. Multiple lesions on BL legs with various stages of healing.  + RUE fistula - intact   MSK:      LLE knee with palpable effusion, no warmth, no ttp Mood: Pleasant affect, appropriate mood.  Neurologic exam: Alert and oriented x 4, fluent, follows commands, cranial nerves II through XII grossly intact, no hypertonia noted  Sensation: Reduced to light touch in distal right lower extremity to the mid-thigh       Strength:                RUE: 2/5 SA, 2/5 EF, 2/5 EE, 2/5 WE, 2/5 FF, 2/5 FA                LUE:  3/5 SA, 3/5 EF, 3/5 EE, 3/5 WE, 3/5 FF, 3/5 FA  RLE: 2/5 HF, 2/5 KE, 2/5  DF, 2/5  EHL, 2/5  PF                 LLE:  3/5 HF, 4/5 KE, 4/5  DF, 4/5  EHL, 4/5  PF  Neuroexam stable 4/21  Assessment/Plan: 1. Functional deficits which require 3+ hours per day of interdisciplinary therapy in a comprehensive inpatient rehab setting. Physiatrist is providing close team supervision and 24 hour management of active medical problems listed below. Physiatrist and rehab team continue to assess barriers to discharge/monitor patient progress toward functional and medical goals  Care Tool:  Bathing    Body parts bathed by patient: Right arm, Left arm, Chest, Abdomen, Front perineal area, Buttocks, Face, Left lower leg, Right lower leg, Left upper leg, Right upper leg   Body parts bathed by helper: Left arm, Front perineal area, Buttocks, Right lower leg, Left lower leg     Bathing assist Assist Level: Contact Guard/Touching assist     Upper Body Dressing/Undressing Upper body dressing   What is the patient wearing?: Pull over shirt    Upper body assist Assist Level: Supervision/Verbal cueing    Lower Body Dressing/Undressing Lower body dressing      What is the patient wearing?: Pants, Underwear/pull up     Lower body  assist Assist for lower body dressing: Minimal Assistance - Patient > 75%     Toileting Toileting Toileting Activity did not occur (Clothing management and hygiene only): N/A (no void or bm) (anuric, HD patient)  Toileting assist Assist for toileting: Moderate Assistance - Patient 50 - 74%     Transfers Chair/bed transfer  Transfers assist     Chair/bed transfer assist level: Minimal Assistance - Patient > 75%     Locomotion Ambulation   Ambulation assist   Ambulation activity did not occur: Safety/medical concerns  Assist level: Minimal Assistance - Patient > 75% Assistive device: Walker-rolling Max distance: 125'   Walk 10 feet activity   Assist  Walk 10 feet activity did not occur: Safety/medical concerns  Assist level: Minimal Assistance - Patient > 75% Assistive device: Walker-rolling   Walk 50 feet activity   Assist Walk 50 feet with 2 turns activity did not occur: Safety/medical concerns  Assist level: Minimal Assistance - Patient > 75% Assistive device: Walker-rolling    Walk 150 feet activity   Assist Walk 150 feet activity did not occur: Safety/medical concerns  Assist level: 2 helpers      Walk 10 feet on uneven surface  activity   Assist Walk 10 feet on uneven surfaces activity did not occur: Safety/medical concerns         Wheelchair     Assist Is the patient using a wheelchair?: Yes Type of Wheelchair: Manual    Wheelchair assist level: Independent Max wheelchair distance: 300    Wheelchair 50 feet with 2 turns activity    Assist        Assist Level: Independent   Wheelchair 150 feet activity     Assist      Assist Level: Independent   Blood pressure 131/75, pulse 79, temperature 98.2 F (36.8 C), temperature source Oral, resp. rate 18, height 6\' 1"  (1.854 m), weight 87.4 kg, SpO2 98%.  Medical Problem List and Plan: 1. 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  2.  Antithrombotics: -DVT/anticoagulation:  Mechanical: Sequential compression devices, below knee Bilateral lower extremities             -antiplatelet therapy: N/A--no antiplatelet due to hx of recurrent GIB  4/25- d/w family- they agree that Lovenox  not appropriate at this time.   -06/09/23 ordered dopplers to r/o clots given low grade temps recently  4/28- LE Dopplers (-) for DVT 3. Pain Management:  oxycodone  prn. Gabapentin  300 mg/hs.              --has Flexeril  10 mg TID prn              - patient with intermittent, severe b/l headache - add topomax 25 mg BID    4/19- pt rating pain 07/19/08 which is "tolerable" 5/7- got Ortho to come and take fluid off R knee- got 100cc off- pain is no more than 1/10 this AM! 5/8-5/9 knee still doing well 5/13-5/14-  pain controlled- allowed him to walk further yesterday in therapy- still cannot do more than 1 step 5/16- will get R hip xray due to clunk and R hi pain -06/29/23 R hip xray with cystic changes of femoral head/neck, also degeneration on my review- will need ortho f/up but doubt need for emergent consultation. Dr. Christiane Cowing is his primary ortho 5/20- Pt's Ortho responded and knows about Deg arthritis in R hip- but cannot address while in hospital  4. Mood/Behavior/Sleep: LCSW to follow for evaluation and support             -antipsychotic agents: N/A             --melatonin prn for insomnia.   -06/09/23 poor sleep, d/t interruptions, asked nursing to minimize interruptions between 11p-5a (apparently they can't put a sign up, but will communicate in huddle) 5. Neuropsych/cognition: This patient is capable of making decisions on his own behalf. 5/8- Ox3 and appropriate 6. Skin/Wound Care: Routine pressure relief measures.   4/19- due to ulcers on legs, on low air loss mattress 7. Fluids/Electrolytes/Nutrition: Strict I/O. Daily weights. Labs with HD. 8. ESRD- HD MWF: Schedule HD at the end of the day to help with tolerance of therapy             --Sensipar  and Renvela  for metabolic bone disease.   - Patient reports no longer makes urine  5/13 - last HD yesterday 5/15- last HD yesterday=- they were not able to finish due to equipment issues- will need to monitor fluid intake today- so can make it til tomorrow- they are not doing more today.  9. Anemia of chronic disease: Monitor H/H, aranesp  5/9- Hb 7.6 in last 24 hours- done yesterday- feels better- no dizziness or fatigue- so doesn't feel transfusion is appropriate 5/12 HGB up to 8.1 , improved continue to monitor.  He is on Aranesp  per nephrology 5/15- pt needs to  have CBC rechecked in HD tomorrow-  -06/29/23 Hgb 7.8, stable. Monitor.  5/20- Hb 8.0 5/21- refused labs this AM since getting in HD- overall labs looking better 2 days ago 10. H/o recurrent GIB/Jehovah's Witness: Continue Protonix  BID 11. H/o Hypotension: Has had drop  in BP with HD. Now cervical SCI.  --Monitor for symptoms day after HD and with activity. -4/18 baseline blood pressure stable, monitor with activity 4/19- BP actually slightly elevated today- 140s-150's- con't regimen, since can drop with HD and therapy 4/20- BP actually doing better today- in 100s'110s- however HR elevated, likely to fluid overload 4/21 nephrology started midodrine  10mg  before dialysis, BP appears to be doing a little better today 5/8- BP on low side in last 24 hours- likely made worse  due to HD yesterday- denies OH Sx's. Con't to monitor trend 5/9- BP is running in 90's systolic- doesn't feel dizzy or lightheaded sitting, but could drop when standing-will d/w therapy -5/20-5/21BP doing well- con't to monitor Vitals:   07/01/23 1630 07/01/23 1700 07/01/23 1730 07/01/23 1800  BP: 118/71 128/76 (!) 148/83 119/80   07/01/23 1831 07/01/23 1900 07/01/23 1930 07/01/23 1945  BP: 117/78 131/77 121/79 125/75   07/01/23 2132 07/02/23 0319 07/02/23 1639 07/02/23 1947  BP: 114/81 105/77 124/78 131/75     12. Constipation: Small BM on 04/14 --had of nulytey yesterday and another today? -Xray 4/17 without excessive stool, patient reports stool still little hard yesterday.  Will increase MiraLAX  to 3 times a day, continue Senokot twice a day. Suppository PRN   06/09/23 LBM yesterday, cont regimen 4/28- LBM overnight- has been refusing miralax - made it prn- taking metamucil  5/18- LBM this morning per pt  5/20- LBM yesterday 13. ?left knee effusion. Not currently bothersome. Consider imaging.  14.  SIRS- Fever/hypotension/tachycardia - 4/21 Currently afebrile, he got to 50 cc IVF yesterday, CXR negative, EKG sinus tachycardia. Patient was started on Maxipime  and vancomycin  yesterday.  Appears to be doing much better today.  WBC not elevated at 7.1.  Continue to follow blood cultures 4/22- Blood Cx's (-) so far- WBC 7.3- will con't IV ABX until know final results of blood Cx's- CXR (-)- but temp was 103.2 yesterday overnight.  4/23- Blood C'x (-) x2 days so far- T 99.9 this Am after a few sips of coffee- was 100.4 this AM- will con't IV ABX since concerned about possible bacteremia- esp since having low grade temps still 4/24- No more fevers, even low grade- no growth on blood Cx' bu it says that didn't get enough blood, so much be falsely negative 4/25- No growth for 5 days, but it also said not enough blood to be sure was a true negative. Will change stop date til  tomorrow -06/08/23 at 3am temp recorded as 100.3 but 5am was 98.3; will get CBC w/diff, monitor temps closely today, if continued low grade temps, will reach out to ID. BCx NGTD x5d. Didn't get vanc yesterday? Or cefepime ? Unclear why. Cefepime  started this morning at 0011. -06/09/23 temp 100.3 at 3am yesterday and then 100 later in the day, but no fevers since, no tachycardia.  -WBC yesterday 8.9.  -Now with some confusion; spoke with Dr. Levern Reader of ID, stated cefepime  could be the cause of the confusion -recommended we stop all abx (which ended yesterday anyway) -watch the patient OFF abx -if fevers >100.3 occur then she recommended repeat BCx and CXR and could call her back to see if any abx would be advised at that  time.  -Head CT and dopplers ordered as well, as above 4/28- CT looks good- no fever, WBC is OK- off ABX 4/29- Also Dopplers (-) feeling good this AM 06/16/23 seems to have resolved. 15. Tachycardia- resolved 4/22- doing better- right at 100 this AM- will monitor- likely can be from dregs of infection vs OH. -will monitor  4/23- Tachycardia improving- 4/24-27 HR in 90's right now- doing better  4/29- 4/30- HR running in 90s-low 100's today 5/12 heart rate in the 70s and 80s primarily, continue to monitor 5/20- HR back into low 100's- but is feeling good- denies Sx's.     07/02/2023    7:47 PM 07/02/2023    4:39 PM 07/02/2023    3:21 AM  Vitals with BMI  Weight   192 lbs 11 oz  Systolic 131 124   Diastolic 75 78   Pulse 79 83     16. R hand swelling  4/23- doing ice- will do xrays 4/24- Xray doesn't look like cause of swelling, however could be due to new shunt? 4/30- resolved 17. Hypocalcemia 4/24- his Albumin  is 2.1, so probably ok, and phos coming down- will leave to renal- thank you for their help  5/1- Ca 9.3 5/8- Ca 8.8 5/15- Ca 8.5 18. Hyperkalemia 4/28- K+ 6.3 today- got hypoosmotic bath to reduce K+ per note from RN/HD nurse? Also has Na of 127- being addressed  by HD/renal- Phos also 5.8- but again, getting HD today 5/1- K+ down to 5.6 and Na 129 5/6- Na 133 and K+ down to 4.7 from 5.6 5/7- refused labs this AM- wants in HD 5/9- K_ came back as 4.5 5/12 potassium elevated today, chronic issue has dialysis today.  Treatment per renal team 5/14- K+ down to 5.7- from 6.2- has HD today 06/29/23 down to 5.3 yesterday, had dialysis yesterday.  5/19- HD today 5/20- K+ stayed at 5.3- doing better 19. R>L knee pain 4/29- will ask Ortho if they will tap his R knee- I have no problem doing injection of knee, however with so much fluid, I would rather Ortho tap him and then do steroid injection.   4/30- we added Prednisone  40 mg daily x 3 days- to see if helps knee pain  5/1- mild reduction in pain so far 5/6- Pt has short term improvement from Prednisone  dosing for knees- con't percocet prn 5/7- since pt was doing so much worse with therapy, asked Ortho to come and tap knee and take off fluid- do steroid injection- they drew off 100CC- 5 different draws required- fluid is pending, but has a lot of WBCs- no organisms on gram stain.   5/8- no growth for 24 hours- R knee pain so much better  -06/22/23 NGTD x3 days, monitor  5/14- No growth  - pain so much better after 100cc fluid removed and got injection 20.  R hip pain 5/16- will get R hip xray- also c/w trochanteric bursitis on exam- if doesn't improve, will do trochanteric bursa injection Tuesday -06/29/23 R hip xray with cystic changes of femoral head/neck, also degeneration on my review- will need ortho f/up but doubt need for emergent consultation. Dr. Christiane Cowing is his primary ortho.  5/20- Dr Christiane Cowing wrote that he has severe arthritis, but needs to be out of hospital to get hip replacement.    I spent a total of 35   minutes on total care today- >50% coordination of care- due to  D/w PT as well as SW about plan for D/C  to SNF as well as time for new HD/timing.   LOS: 34 days A FACE TO FACE EVALUATION WAS  PERFORMED  Gara Kincade 07/03/2023, 8:29 AM

## 2023-07-03 NOTE — Progress Notes (Signed)
 [facility] Progress Note   Subjective:   Seen on HD today. Reports feeling well today, no concerns. Denies SOB, CP, dizziness.   Objective Vitals:   07/03/23 1400 07/03/23 1430 07/03/23 1500 07/03/23 1530  BP: 123/75 129/79 129/74 (!) 144/81  Pulse: 85 89 82 83  Resp: 14 17  19   Temp:      TempSrc:      SpO2:  100%  98%  Weight:      Height:       Physical Exam General: alert male in NAD Heart: RRR, no murmurs, rubs or gallops Lungs: CTA bilaterally, respirations unlabored, room air Abdomen: Soft, non-distended, +BS Extremities: trace edema bilaterally Dialysis Access: RUE AVF +t/b  Additional Objective Labs: Basic Metabolic Panel: Recent Labs  Lab 06/26/23 1558 06/28/23 1500 07/01/23 1537  NA 132* 133* 132*  K 5.5* 5.3* 5.3*  CL 94* 91* 94*  CO2 23 24 22   GLUCOSE 112* 105* 90  BUN 85* 73* 78*  CREATININE 9.17* 9.02* 9.69*  CALCIUM  8.4* 8.3* 7.6*  PHOS 5.0* 5.0* 4.8*   Liver Function Tests: Recent Labs  Lab 06/26/23 1558 06/28/23 1500 07/01/23 1537  ALBUMIN  2.4* 2.5* 2.5*   No results for input(s): "LIPASE", "AMYLASE" in the last 168 hours. CBC: Recent Labs  Lab 06/28/23 1500 07/01/23 1537  WBC 5.3 5.8  NEUTROABS  --  3.5  HGB 7.8* 8.0*  HCT 24.6* 25.0*  MCV 89.8 89.3  PLT 278 260   Blood Culture    Component Value Date/Time   SDES FLUID 06/18/2023 1357   SPECREQUEST NONE 06/18/2023 1357   CULT  06/18/2023 1357    NO GROWTH 3 DAYS Performed at [facility], [address], [place], [place] [address]    REPTSTATUS 06/22/2023 FINAL 06/18/2023 1357    Cardiac Enzymes: No results for input(s): "CKTOTAL", "CKMB", "CKMBINDEX", "TROPONINI" in the last 168 hours. CBG: No results for input(s): "GLUCAP" in the last 168 hours. Iron Studies: No results for input(s): "IRON", "TIBC", "TRANSFERRIN", "FERRITIN" in the last 72 hours. @lablastinr3 @ Studies/Results: No results found. Medications:    (feeding supplement)  PROSource Plus  30 mL Oral BID BM   atorvastatin   10 mg Oral Q Thu   carbamide peroxide  5 drop Right EAR BID   Chlorhexidine  Gluconate Cloth  6 each Topical Q0600   cinacalcet   120 mg Oral Q supper   darbepoetin (ARANESP ) injection - DIALYSIS  150 mcg Subcutaneous Q Tue-1800   gabapentin   300 mg Oral QHS   Gerhardt's butt cream   Topical BID   pantoprazole   40 mg Oral BID AC   psyllium  1 packet Oral TID   senna-docusate  2 tablet Oral BID   sevelamer  carbonate  3,200 mg Oral TID WC   sodium zirconium cyclosilicate   10 g Oral Daily   topiramate   25 mg Oral BID    Dialysis Orders: OP Dialysis Orders [facility] - MWF 4h  B400   85.5kg   2K bath  AVF   - Heparin  3000 units   - Mircera 60mcg IV q 2 weeks- last dose 05/24/23  - Calcitriol  1.5mcg PO q HD  Assessment/Plan: 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  Labs in AM Hyperkalemia:Chronic issue.  On Lokelma  daily now. Continue to reinforce renal diet. HTN/volume:pptimize volume status with HD. Continue other current regimen  Anemia of ESRD:  Further increase to aranesp  200 mcg weekly  on Tuesdays for next dose on 5/27.  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 Secondary HPTH: CorrCa controlled, phos above goal, continue sevelamer  and sensipar  Nutrition: Alb 2.5, continue protein supplements. Patient reports appetite is better  Dispo - per rehab. Pt states next week he will be discharged from rehab he thinks   [name], MD 07/03/2023 3:46 PM

## 2023-07-03 NOTE — Patient Care Conference (Signed)
 Inpatient RehabilitationTeam Conference and Plan of Care Update Date: 07/02/2023   Time: 1153 am    Patient Name: Carl Mcquain Sr.      Medical Record Number: [id number]  Date of Birth: 02-08-1961 Sex: Male         Room/Bed: 4W17C/4W17C-01 Payor Info: Payor: Advertising copywriter MEDICARE / Plan: Sandford Croon DUAL COMPLETE / Product Type: *No Product type* /    Admit Date/Time:  05/30/2023  2:11 PM  Primary Diagnosis:  Spinal cord injury, cervical region, sequela [facility])  Hospital Problems: Principal Problem:   Spinal cord injury, cervical region, sequela (HCC) Active Problems:   Coping style affecting medical condition    Expected Discharge Date: Expected Discharge Date:  (pending)  Team Members Present: Physician leading conference: Dr. Celia Coles Social Worker Present: Adrianna Albee, LCSW Nurse Present: Jerene Monks, RN PT Present: Aundria Leech, PT OT Present: [name], OT PPS Coordinator present : Jestine Moron, SLP     Current Status/Progress Goal Weekly Team Focus  Bowel/Bladder   Pt continent of bowel/bladder. LBM 5/20   Maintain bowel/bladder continence.   Assist with toileting needs q shift/PRN.    Swallow/Nutrition/ Hydration               ADL's   Min LB dressing, Min footwear, Min-mod sit to stands with Min A step pivot transfers, SBA UB ADLs, set up for feeding/grooming   Min A overall   UE strength/endurance, functional mobility    Mobility   STS from 27.5" surface with CGA, min a from lower surfaces, CGA gait up to 160 ft w/ chair follow, up to 120 ft CGA only   S bed mobility, CGA transfers bed <> w/c, mod A sit < > stands, upgraded gait goal to min A, mod I PWC mobility  progressing gait, LE strength, stair progression    Communication               Safety/Cognition/ Behavioral Observations               Pain   Pt c/o pain 7/10 in R hip. Medicated with PRN Percocet. Positive effect noted.   Pain level less than or equal to  2.   Assess pain q shift/PRN.    Skin   Surgical incision to anterior neck. Soft collar in place. R forearm fistula.   Maintain skin integrity with no s/sx of infection.  Assess skin q shift/PRN.      Discharge Planning:  Looking for SNF bed until can get to first floor continues to make progress in therapies and goals upgraded. Barriers is dialysis and insurance    Team Discussion: Patient was admitted post Corpectomy with traumatic C4 SCI and on Hemodialysis..Patient limited by right hip pain and right knee pain.   Patient on target to meet rehab goals: yes,  currently needs minimal assistance with lower body care and SBA with upper body care. Patient needs minimal assistance with step pivot transfers. Patient able to ambulate up to 160' with chair follow CGA and up to 120' CGA only. Overall goals at discharge are set for minimal assist and mod I with PWC mobility.  *See Care Plan and progress notes for long and short-term goals.   Revisions to Treatment Plan:   Rt knee tap/steroid injection Power wheelchair Slide board Family Conference Resting hand splint Built up handles for feeding and grooming C-Collar Head CT Dopplers W/C evaluation Ortho consult  Teaching Needs: Safety, medications, dietary modifications, safety, toileting and transfers, etc  Current Barriers to Discharge: Decreased caregiver support, Home enviroment access/layout, and Hemodialysis  Possible Resolutions to Barriers: Family Education SNF     Medical Summary Current Status: R hip and R knee pain; went over meds- and said what's necessary- pain good but can spike sometimes since R knee injection;  Barriers to Discharge: Behavior/Mood;Neurogenic Bowel & Bladder;Medical stability;Self-care education;Weight bearing restrictions;Renal Insufficiency/Failure;Electrolyte abnormality;Hypotension;Symptomatic Anemia;Spasticity  Barriers to Discharge Comments: limited by transfers from low surfaces-  pain R hip and R knee- intermittent-  Elctrolyte- hyperkalemia-dispo secondary to 18 steps Possible Resolutions to [employer]: has done 6 steps- trying to work on R THR that's needed- d/c SNF   Continued Need for Acute Rehabilitation Level of Care: The patient requires daily medical management by a physician with specialized training in physical medicine and rehabilitation for the following reasons: Direction of a multidisciplinary physical rehabilitation program to maximize functional independence : Yes Medical management of patient stability for increased activity during participation in an intensive rehabilitation regime.: Yes Analysis of laboratory values and/or radiology reports with any subsequent need for medication adjustment and/or medical intervention. : Yes   I attest that I was present, lead the team conference, and concur with the assessment and plan of the team.   Trameka Dorough Gayo 07/02/2023, 1153 am

## 2023-07-03 NOTE — Progress Notes (Signed)
 Physical Therapy Session Note  Patient Details  Name: Carl Grissinger Sr. MRN: [id number] Date of Birth: Dec 25, 1960  Today's Date: 07/03/2023 PT Individual Time: 0805-0900 and 1108-1205 PT Individual Time Calculation (min): 55 min and 57 min  Short Term Goals: Week 5:  PT Short Term Goal 1 (Week 5): = LTGS due to ELOS  Skilled Therapeutic Interventions/Progress Updates: Pt presented in PWC completing breakfast. Pt states received late. PTA returned x5 min later with pt having completed and ready for therapy. Pt navigated to day room mod I. Discussed seat heights particularly at toilet. Discussed at [facility] will continue to work on standing from lower surface as unsure of seat height at new apt. Discussed will continue while here to work on it. Pt agreeable to work from 25 in height this session. Pt completed ambulatory transfer to high/low mat with RW and light minA to stand and CGA for gait. Pt requesting to work on UE strength this session. Pt worked on the following: biceps curls with 4lb weight on LLE 2lb weight RLE, shoulder flexion with 2lb weights bilaterally, punches with 2lb weights bilaterally and triceps dips with yoga blocks. All activities were performed to fatigue (approx 3 x 15) with intermittent tactile cues for technique. Pt then ambulated ~71ft to scale and stepped onto scale for weight check (88.8Kg). Pt was able to side step onto scale leading with LLE with CGA. Pt then ambulated back to room ~177ft with CGA. Pt returned to [facility] at end of session and left with call bell within reach and needs met.   Tx2: Pt presented in PWC agreeable to therapy. Pt denies pain at rest. Pt navigated to main gym mod I and set up at stairs. Pt ascended 2 x 2 steps with CGA leading with LLE and descending backwards. PTA noted mild R knee instability however no buckling noted. Pt then worked on Sit to stand from 25in height in [employer]. Pt initially requiring min/modA for increased anterior weight shifting however  was able to progress to CGA but 5th rep!. Navigated to ortho gym and completed UBE L4 x 10 min (5 min forward/backwards). With fatigue pt noted to have increased compensatory movement with trunk leans. Once completed pt navigated back to room in same manner as prior and remained in w/c at end of session with call bell within reach and needs met.      Therapy Documentation Precautions:  Precautions Precautions: Fall, Cervical, Other (comment) (HD MWF) Recall of Precautions/Restrictions: Impaired Precaution/Restrictions Comments: Verbally reviewed cervical precautions. Required Braces or Orthoses: Cervical Brace Cervical Brace: Soft collar, At all times Restrictions Weight Bearing Restrictions Per Provider Order: No General:   Vital Signs: Therapy Vitals Temp: 98.4 F (36.9 C) Temp Source: Oral Pulse Rate: 100 Resp: 19 BP: 122/72 Patient Position (if appropriate): Sitting Oxygen Therapy SpO2: 98 % O2 Device: Room Air Pain: Pain Assessment Pain Scale: 0-10 Pain Score: 0-No pain   Therapy/Group: Individual Therapy  Bosten Newstrom 07/03/2023, 4:30 PM

## 2023-07-03 NOTE — Progress Notes (Signed)
 Occupational Therapy Session Note  Patient Details  Name: Carl Howry Sr. MRN: [id number] Date of Birth: 1960-10-18  Today's Date: 07/03/2023 OT Individual Time: 0905-1020 OT Individual Time Calculation (min): 75 min    Short Term Goals: Week 5:  OT Short Term Goal 1 (Week 5): Pt will complete toileting at Min A with AE as necessary OT Short Term Goal 2 (Week 5): Pt will complete bathing at [facility] with AE as necessary  Skilled Therapeutic Interventions/Progress Updates:      Therapy Documentation Precautions:  Precautions Precautions: Fall, Cervical, Other (comment) (HD MWF) Recall of Precautions/Restrictions: Impaired Precaution/Restrictions Comments: Verbally reviewed cervical precautions. Required Braces or Orthoses: Cervical Brace Cervical Brace: Soft collar, At all times Restrictions Weight Bearing Restrictions Per Provider Order: No General: "See you tomorrow!" Pt seated in W/C upon OT arrival, agreeable to OT.  Pain: no pain reported  Exercises: OT instructing pt in UE theraband HEP in order to increase functional strength, endurance and activity tolerance in order to increase independence in ADLs such as bathing. Pt issued red and green theraband and discussed direction/technique of exercises, demonstrating verbal understanding. Pt completed 3x10 exercises listed below: -elbow extensions -shoulder horizontal abduction -bicep curls  -shoulder flexion -diagonal shoulder flexion -triceps push up in W/C   Pt seated in W/C at end of session with W/C alarm donned, call light within reach and 4Ps assessed.     Therapy/Group: Individual Therapy  [name], OTD, OTR/L 07/03/2023, 11:54 AM

## 2023-07-04 LAB — RENAL FUNCTION PANEL
Albumin: 2.8 g/dL — ABNORMAL LOW (ref 3.5–5.0)
Anion gap: 15 (ref 5–15)
BUN: 33 mg/dL — ABNORMAL HIGH (ref 8–23)
CO2: 25 mmol/L (ref 22–32)
Calcium: 8.6 mg/dL — ABNORMAL LOW (ref 8.9–10.3)
Chloride: 93 mmol/L — ABNORMAL LOW (ref 98–111)
Creatinine, Ser: 5.91 mg/dL — ABNORMAL HIGH (ref 0.61–1.24)
GFR, Estimated: 10 mL/min — ABNORMAL LOW (ref >60)
Glucose, Bld: 86 mg/dL (ref 70–99)
Phosphorus: 3.9 mg/dL (ref 2.5–4.6)
Potassium: 4.1 mmol/L (ref 3.5–5.1)
Sodium: 133 mmol/L — ABNORMAL LOW (ref 135–145)

## 2023-07-04 LAB — CBC
HCT: 30.7 % — ABNORMAL LOW (ref 39.0–52.0)
Hemoglobin: 9.5 g/dL — ABNORMAL LOW (ref 13.0–17.0)
MCH: 27.7 pg (ref 26.0–34.0)
MCHC: 30.9 g/dL (ref 30.0–36.0)
MCV: 89.5 fL (ref 80.0–100.0)
Platelets: 262 10*3/uL (ref 150–400)
RBC: 3.43 MIL/uL — ABNORMAL LOW (ref 4.22–5.81)
RDW: 16.6 % — ABNORMAL HIGH (ref 11.5–15.5)
WBC: 3.6 10*3/uL — ABNORMAL LOW (ref 4.0–10.5)
nRBC: 0 % (ref 0.0–0.2)

## 2023-07-04 MED ORDER — CHLORHEXIDINE GLUCONATE CLOTH 2 % EX PADS
6.0000 | MEDICATED_PAD | Freq: Every day | CUTANEOUS | Status: DC
  Administered 2023-07-05 – 2023-07-06 (×2): 6 via TOPICAL

## 2023-07-04 MED ORDER — CHLORHEXIDINE GLUCONATE CLOTH 2 % EX PADS: 6.0000 | MEDICATED_PAD | Freq: Every day | CUTANEOUS | Status: DC

## 2023-07-04 MED ORDER — CALCITRIOL 0.5 MCG PO CAPS
1.5000 ug | ORAL_CAPSULE | ORAL | Status: DC
  Administered 2023-07-05 – 2023-07-15 (×5): 1.5 ug via ORAL
  Filled 2023-07-04 (×5): qty 3

## 2023-07-04 NOTE — Plan of Care (Signed)
  Problem: Consults Goal: RH SPINAL CORD INJURY PATIENT EDUCATION Description:  See Patient Education module for education specifics.  Outcome: Progressing   Problem: SCI BOWEL ELIMINATION Goal: RH STG MANAGE BOWEL WITH ASSISTANCE Description: STG Manage Bowel with minimal Assistance. Outcome: Progressing Goal: RH STG SCI MANAGE BOWEL WITH MEDICATION WITH ASSISTANCE Description: STG SCI Manage bowel with medication with assistance. Outcome: Progressing   Problem: RH SKIN INTEGRITY Goal: RH STG SKIN FREE OF INFECTION/BREAKDOWN Description: Manage skin free of infection/breakdown with minimal assistance Outcome: Progressing   Problem: RH SAFETY Goal: RH STG ADHERE TO SAFETY PRECAUTIONS W/ASSISTANCE/DEVICE Description: STG Adhere to Safety Precautions With minimal Assistance/Device. Outcome: Progressing   Problem: RH PAIN MANAGEMENT Goal: RH STG PAIN MANAGED AT OR BELOW PT'S PAIN GOAL Description: Pain<4 w/ prns Outcome: Progressing   Problem: RH KNOWLEDGE DEFICIT SCI Goal: RH STG INCREASE KNOWLEDGE OF SELF CARE AFTER SCI Description: Manage increase knowledge of self care after SCI with minimal assistance using educational materials provided to son Outcome: Progressing

## 2023-07-04 NOTE — Progress Notes (Signed)
 Physical Therapy Session Note  Patient Details  Name: Carl Rise Sr. MRN: [id number] Date of Birth: [birth year]  Today's Date: 07/04/2023 PT Individual Time: 0800-0830 PT Individual Time Calculation (min): 30 min   Short Term Goals: Week 5:  PT Short Term Goal 1 (Week 5): = LTGS due to ELOS  Skilled Therapeutic Interventions/Progress Updates:    Pt recd in PWC, No complaint of pain. Session focused on gait training with RW and CGA overall. Pt with good technique and awareness. Pt able to self correct at times for low foot clearance. Adjusted RW for improved posture and provided continued cues for upright posture. Pt demoes improvement in gait distance and overall mechanics this session. Pt ambulated 2 x 116 ft and 1 x 190 ft with seated rest breaks. Pt returned to room and to chair, was left with needs in reach.   Therapy Documentation Precautions:  Precautions Precautions: Fall, Cervical, Other (comment) (HD MWF) Recall of Precautions/Restrictions: Impaired Precaution/Restrictions Comments: Verbally reviewed cervical precautions. Required Braces or Orthoses: Cervical Brace Cervical Brace: Soft collar, At all times Restrictions Weight Bearing Restrictions Per Provider Order: No General:       Therapy/Group: Individual Therapy  [name] 07/04/2023, 12:50 PM

## 2023-07-04 NOTE — Progress Notes (Signed)
 [facility] Progress Note   Subjective:    Last HD on 5/21 with 4 kg UF.  He states that he felt like he got some fluid off yesterday - he thinks he has been drinking more water  than normal.  He'd like to try 3-3.5 kg UF tomorrow.   Review of systems:  Denies shortness of breath or chest pain  Denies n/v     Objective Vitals:   07/03/23 1722 07/03/23 1847 07/03/23 2013 07/04/23 0545  BP: 113/71 105/61 122/81 102/71  Pulse: 98 96 95 80  Resp:  16 18 17   Temp: 97.8 F (36.6 C) 98.6 F (37 C) 98.9 F (37.2 C) 99.3 F (37.4 C)  TempSrc:  Oral Oral Oral  SpO2:  100% 100% 100%  Weight: 84.7 kg     Height:       Physical Exam   General adult male in chair in no acute distress HEENT normocephalic atraumatic extraocular movements intact sclera anicteric Neck C-spine brace on; trachea midline Lungs clear to auscultation bilaterally normal work of breathing at rest on room air Heart S1S2 no rub Abdomen soft nontender nondistended Extremities no edema Psych normal mood and affect Neuro - alert and oriented x 3 provides hx and follows commands  Access:  RUE AVF with bruit and thrill   Additional Objective Labs: Basic Metabolic Panel: Recent Labs  Lab 06/28/23 1500 07/01/23 1537 07/04/23 0719  NA 133* 132* 133*  K 5.3* 5.3* 4.1  CL 91* 94* 93*  CO2 24 22 25   GLUCOSE 105* 90 86  BUN 73* 78* 33*  CREATININE 9.02* 9.69* 5.91*  CALCIUM  8.3* 7.6* 8.6*  PHOS 5.0* 4.8* 3.9   Liver Function Tests: Recent Labs  Lab 06/28/23 1500 07/01/23 1537 07/04/23 0719  ALBUMIN  2.5* 2.5* 2.8*   No results for input(s): "LIPASE", "AMYLASE" in the last 168 hours. CBC: Recent Labs  Lab 06/28/23 1500 07/01/23 1537 07/04/23 0719  WBC 5.3 5.8 3.6*  NEUTROABS  --  3.5  --   HGB 7.8* 8.0* 9.5*  HCT 24.6* 25.0* 30.7*  MCV 89.8 89.3 89.5  PLT 278 260 262   Blood Culture    Component Value Date/Time   SDES FLUID 06/18/2023 1357   SPECREQUEST NONE 06/18/2023 1357    CULT  06/18/2023 1357    NO GROWTH 3 DAYS Performed at [facility], [address] [place], [place] [address]    REPTSTATUS 06/22/2023 FINAL 06/18/2023 1357    Cardiac Enzymes: No results for input(s): "CKTOTAL", "CKMB", "CKMBINDEX", "TROPONINI" in the last 168 hours. CBG: Recent Labs  Lab 07/03/23 1850  GLUCAP 147*   Iron Studies: No results for input(s): "IRON", "TIBC", "TRANSFERRIN", "FERRITIN" in the last 72 hours. @lablastinr3 @ Studies/Results: No results found. Medications:    (feeding supplement) PROSource Plus  30 mL Oral BID BM   atorvastatin   10 mg Oral Q Thu   carbamide peroxide  5 drop Right EAR BID   Chlorhexidine  Gluconate Cloth  6 each Topical Q0600   cinacalcet   120 mg Oral Q supper   [START ON 07/09/2023] darbepoetin (ARANESP ) injection - DIALYSIS  200 mcg Subcutaneous Q Tue-1800   gabapentin   300 mg Oral QHS   Gerhardt's butt cream   Topical BID   pantoprazole   40 mg Oral BID AC   psyllium  1 packet Oral TID   senna-docusate  2 tablet Oral BID   sevelamer  carbonate  3,200 mg Oral TID WC   sodium zirconium cyclosilicate   10  g Oral Daily   topiramate   25 mg Oral BID    Dialysis Orders: OP Dialysis Orders [facility] - MWF 4h  B400   85.5kg   2K bath  AVF   - Heparin  3000 units   - Mircera 60mcg IV q 2 weeks- last dose 05/24/23  - Calcitriol  1.5mcg PO q HD  Assessment/Plan: 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  Labs in AM Hyperkalemia:Chronic issue.  On Lokelma  daily now. Continue to reinforce renal diet. HTN/volume:optimize volume status with HD. Controlled.  He feels like he got off the fluid that had built up.  Anemia of ESRD:  had planned to further increase to aranesp  200 mcg weekly on Tuesdays for next dose on 5/27 - may need to back down to 150 mcg again depending on trends.  He is a [employer] and does not accept blood products except in emergent situations per  charting.   Secondary HPTH: continue sevelamer  and sensipar . Resume calcitriol   Nutrition: Alb 2.5, continue protein supplements. Patient reports appetite is better  Dispo - per rehab. Pt states next week he will be discharged from rehab he thinks   [name], MD 07/04/2023 4:30 PM

## 2023-07-04 NOTE — Progress Notes (Signed)
 Occupational Therapy Session Note  Patient Details  Name: Carl Pickup Sr. MRN: [id number] Date of Birth: 08/12/1960  Today's Date: 07/04/2023 OT Individual Time: 1445-1530 OT Individual Time Calculation (min): 45 min    Short Term Goals: Week 5:  OT Short Term Goal 1 (Week 5): Pt will complete toileting at Min A with AE as necessary OT Short Term Goal 2 (Week 5): Pt will complete bathing at [facility] with AE as necessary  Skilled Therapeutic Interventions/Progress Updates:      Therapy Documentation Precautions:  Precautions Precautions: Fall, Cervical, Other (comment) (HD MWF) Recall of Precautions/Restrictions: Impaired Precaution/Restrictions Comments: Verbally reviewed cervical precautions. Required Braces or Orthoses: Cervical Brace Cervical Brace: Soft collar, At all times Restrictions Weight Bearing Restrictions Per Provider Order: No General: "Let's go" Pt seated in W/C upon OT arrival, agreeable to OT. Pt able to complete W/C mobility within PWC from room><day room with mod I  Pain: no pain reported  Other Treatments: OT providing skilled intervention in individual BUE and BLE dance exercises in social setting including reaching out of BOS, BUE and BLE ROM in all planes while seated in W/C in order to increase generalized strength/endurance to complete ADLs such as bathing with increased independence. Pt no noted LOB/SOB while participating. Pt required no VC to participate and able to modify movements as necessary to fit into precautions and/or limitations.    Pt seated in W/C at end of session with W/C alarm donned, call light within reach and 4Ps assessed.       Therapy/Group: Individual Therapy  [name], OTD, OTR/L 07/04/2023, 3:57 PM

## 2023-07-04 NOTE — Progress Notes (Signed)
 Occupational Therapy Session Note  Patient Details  Name: [name]. MRN: [id number] Date of Birth: 24-Sep-1960  Today's Date: 07/04/2023 OT Group Time: 1100-1154 OT Group Time Calculation (min): 54 min    Short Term Goals: Week 5:  OT Short Term Goal 1 (Week 5): Pt will complete toileting at Min A with AE as necessary OT Short Term Goal 2 (Week 5): Pt will complete bathing at [facility] with AE as necessary  Skilled Therapeutic Interventions/Progress Updates:      Therapy Documentation Precautions:  Precautions Precautions: Fall, Cervical, Other (comment) (HD MWF) Recall of Precautions/Restrictions: Impaired Precaution/Restrictions Comments: Verbally reviewed cervical precautions. Required Braces or Orthoses: Cervical Brace Cervical Brace: Soft collar, At all times Restrictions Weight Bearing Restrictions Per Provider Order: No  Group Description: UE group:OT providing skilled intervention with the following exercise circuit in order to improve functional activity, strength and endurance to prepare for ADLs.  Pt completed the following exercises in seated position with no noted LOB/SOB and 5:00 min on each exercise: -bicep curls -triceps extensions -rows -chest press    Individual level documentation: Patient completed group from sitting level with red theraband and 4# dowel rod Patient needed demo to complete various exercises with no cues necessary. Patient able to come up with modifications during group if necessary.   Pain: 0/10   Therapy/Group: Group Therapy  [name], OTD, OTR/L 07/04/2023, 4:14 PM

## 2023-07-04 NOTE — Group Note (Unsigned)
 Patient Details Name: Galen Russman Sr. MRN: [id number] DOB: [birth year] Today's Date: 07/04/2023  Time Calculation:        Group Description: BUE Therex Group: Pt participated in group session with a focus on BUE strength and endurance to facilitate improved activity tolerance and strength for higher level BADLs and functional mobility tasks.    Individual level documentation: Pt first in engaged in {Seated/standing:28274} warm up where pt completed ***.  Next, pt completed {Seated/standing:28274} {UE Exercise:28275}. Patient able to complete *** reps.  Ended session with guide deep breathing for 3 mins. Discussed benefits of deep breathing to manage stress and pain. Pt *** back to room with ***. Pt left *** with all needs within reach and bed alarm/chair alarm activated.   Pain: Pain Assessment Pain Scale: 0-10 Pain Score: 0-No pain  Precautions:     Cereniti Curb 07/04/2023, 11:37 AM

## 2023-07-04 NOTE — Progress Notes (Signed)
 PROGRESS NOTE   Subjective/Complaints:  Pt reports frustrated that needed labs this AM, since had HD yesterday- they didn't check labs in HD yesterday- and was frustrated "you ordered". I explained renal ordered, but needed to be done.  Appreciative he lets them check.   No complaints this AM; LBM 2 days ago  This AM walked from room to dayroom with normal RW!! Great progress!   ROS:    Pt denies SOB, abd pain, CP, N/V/C/D, and vision changes   B/L R>L knee pain (+)- R knee very improved +R hip pain-managed but severe arthritis  Objective:   No results found.      Recent Labs    07/01/23 1537 07/04/23 0719  WBC 5.8 3.6*  HGB 8.0* 9.5*  HCT 25.0* 30.7*  PLT 260 262     Recent Labs    07/01/23 1537 07/04/23 0719  NA 132* 133*  K 5.3* 4.1  CL 94* 93*  CO2 22 25  GLUCOSE 90 86  BUN 78* 33*  CREATININE 9.69* 5.91*  CALCIUM  7.6* 8.6*     Intake/Output Summary (Last 24 hours) at 07/04/2023 0950 Last data filed at 07/04/2023 0753 Gross per 24 hour  Intake 238 ml  Output 4000 ml  Net -3762 ml        Physical Exam: Vital Signs Blood pressure 102/71, pulse 80, temperature 99.3 F (37.4 C), temperature source Oral, resp. rate 17, height 6\' 1"  (1.854 m), weight 84.7 kg, SpO2 100%.       General: awake, alert, appropriate, sitting on mat- not out of breath and just finished walk; NAD HENT: conjugate gaze; oropharynx moist-wearing cervical collar CV: regular rate and rhythm; no JVD Pulmonary: CTA B/L; no W/R/R- good air movement GI: soft, NT, ND, (+)BS- slightly hypoactive Psychiatric: appropriate Neurological: Ox3  PRIOR EXAMS:MSK- R lateral and groin TTP- c/w trochanteric bursitis and R joint pain  Neurological: Ox3, follows commands, cranial nerves II through XII grossly intact,   MSK- R knee much less swollen MsK: B/L knees with effusions- less TTP on R knee this AM- looks good- mild  effusion , no erythema, not really focally warm to touch- Both have effusions, - R hand swelling looks good/resolved  MSK- R hand swelling esp ulnar side of hand/dorsum and L wrist-stable this AM RUE- Biceps 4/5; WE 4-/5; Triceps 2/5; Grip 2-/5 and FA 1/5 LUE- 4/5 except triceps 3+ to 4-/5 RLE- HF 1 to 2-/5; KE 2-/5; DF/PF 2-/5 LLE- HF 3/5; KE 4-/5; DF/PF 4/5    Skin: C/D/I. Multiple lesions on BL legs with various stages of healing.  + RUE fistula - intact   MSK:      LLE knee with palpable effusion, no warmth, no ttp Mood: Pleasant affect, appropriate mood.  Neurologic exam: Alert and oriented x 4, fluent, follows commands, cranial nerves II through XII grossly intact, no hypertonia noted  Sensation: Reduced to light touch in distal right lower extremity to the mid-thigh       Strength:                RUE: 2/5 SA, 2/5 EF, 2/5 EE, 2/5 WE, 2/5 FF, 2/5 FA  LUE:  3/5 SA, 3/5 EF, 3/5 EE, 3/5 WE, 3/5 FF, 3/5 FA                RLE: 2/5 HF, 2/5 KE, 2/5  DF, 2/5  EHL, 2/5  PF                 LLE:  3/5 HF, 4/5 KE, 4/5  DF, 4/5  EHL, 4/5  PF  Neuroexam stable 4/21  Assessment/Plan: 1. Functional deficits which require 3+ hours per day of interdisciplinary therapy in a comprehensive inpatient rehab setting. Physiatrist is providing close team supervision and 24 hour management of active medical problems listed below. Physiatrist and rehab team continue to assess barriers to discharge/monitor patient progress toward functional and medical goals  Care Tool:  Bathing    Body parts bathed by patient: Right arm, Left arm, Chest, Abdomen, Front perineal area, Buttocks, Face, Left lower leg, Right lower leg, Left upper leg, Right upper leg   Body parts bathed by helper: Left arm, Front perineal area, Buttocks, Right lower leg, Left lower leg     Bathing assist Assist Level: Contact Guard/Touching assist     Upper Body Dressing/Undressing Upper body dressing   What is the  patient wearing?: Pull over shirt    Upper body assist Assist Level: Supervision/Verbal cueing    Lower Body Dressing/Undressing Lower body dressing      What is the patient wearing?: Pants, Underwear/pull up     Lower body assist Assist for lower body dressing: Minimal Assistance - Patient > 75%     Toileting Toileting Toileting Activity did not occur (Clothing management and hygiene only): N/A (no void or bm) (anuric, HD patient)  Toileting assist Assist for toileting: Moderate Assistance - Patient 50 - 74%     Transfers Chair/bed transfer  Transfers assist     Chair/bed transfer assist level: Minimal Assistance - Patient > 75%     Locomotion Ambulation   Ambulation assist   Ambulation activity did not occur: Safety/medical concerns  Assist level: Minimal Assistance - Patient > 75% Assistive device: Walker-rolling Max distance: 125'   Walk 10 feet activity   Assist  Walk 10 feet activity did not occur: Safety/medical concerns  Assist level: Minimal Assistance - Patient > 75% Assistive device: Walker-rolling   Walk 50 feet activity   Assist Walk 50 feet with 2 turns activity did not occur: Safety/medical concerns  Assist level: Minimal Assistance - Patient > 75% Assistive device: Walker-rolling    Walk 150 feet activity   Assist Walk 150 feet activity did not occur: Safety/medical concerns  Assist level: 2 helpers      Walk 10 feet on uneven surface  activity   Assist Walk 10 feet on uneven surfaces activity did not occur: Safety/medical concerns         Wheelchair     Assist Is the patient using a wheelchair?: Yes Type of Wheelchair: Manual    Wheelchair assist level: Independent Max wheelchair distance: 300    Wheelchair 50 feet with 2 turns activity    Assist        Assist Level: Independent   Wheelchair 150 feet activity     Assist      Assist Level: Independent   Blood pressure 102/71, pulse 80,  temperature 99.3 F (37.4 C), temperature source Oral, resp. rate 17, height 6\' 1"  (1.854 m), weight 84.7 kg, SpO2 100%.  Medical Problem List and Plan: 1. 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  Con't CIR PT and OT  D/c to SNF 5/27- will con't to work on progressing gait distance and less A- also working on stairs, but needs 3 flights of stairs to get to home, is moving 7/1 to 1st floor apt.  2.  Antithrombotics: -DVT/anticoagulation:  Mechanical: Sequential compression devices, below knee Bilateral lower extremities             -antiplatelet therapy: N/A--no antiplatelet due to hx of recurrent GIB  4/25- d/w family- they agree that Lovenox  not appropriate at this time.   -06/09/23 ordered dopplers to r/o clots given low grade temps recently  4/28- LE Dopplers (-) for DVT 3. Pain Management:  oxycodone  prn. Gabapentin  300 mg/hs.              --has Flexeril  10 mg TID prn              - patient with intermittent, severe b/l headache - add topomax 25 mg BID    4/19- pt rating pain 07/19/08 which is "tolerable" 5/7- got Ortho to come and take fluid off R knee- got 100cc off- pain is no more than 1/10 this AM! 5/8-5/9 knee still doing well 5/13-5/14-  pain controlled- allowed him to walk further yesterday in therapy- still cannot do more than 1 step 5/16- will get R hip xray due to clunk and R hi pain -06/29/23 R hip xray with cystic changes of femoral head/neck, also degeneration on my review- will need ortho f/up but doubt need for emergent consultation. Dr. Christiane Cowing is his primary ortho 5/20- Pt's Ortho responded and knows about  Deg arthritis in R hip- but cannot address while in hospital  4. Mood/Behavior/Sleep: LCSW to follow for evaluation and support             -antipsychotic agents: N/A             --melatonin prn for insomnia.  -06/09/23 poor sleep, d/t interruptions, asked nursing to minimize interruptions between 11p-5a (apparently they can't put a sign up, but will communicate in huddle) 5. Neuropsych/cognition: This patient is capable of making decisions on his own behalf. 5/8- Ox3 and appropriate 6. Skin/Wound Care: Routine pressure relief measures.   4/19- due to ulcers on legs, on low air loss mattress 7. Fluids/Electrolytes/Nutrition: Strict I/O. Daily weights. Labs with HD. 8. ESRD- HD MWF: Schedule HD at the end of the day to help with tolerance of therapy             --Sensipar  and Renvela  for metabolic bone disease.   - Patient reports no longer makes urine  5/13 - last HD yesterday 5/15- last HD yesterday=- they were not able to finish due to equipment issues- will need to monitor fluid intake today- so can make it til tomorrow- they are not doing more today.  9. Anemia of chronic disease: Monitor H/H, aranesp  5/9- Hb 7.6 in last 24 hours- done yesterday- feels better- no dizziness or fatigue- so doesn't feel transfusion is appropriate  5/12 HGB up to 8.1 , improved continue to monitor.  He is on Aranesp  per nephrology 5/15- pt needs to have CBC rechecked in HD tomorrow-  -06/29/23 Hgb 7.8, stable. Monitor.  5/20- Hb 8.0 5/21- refused labs this AM since getting in HD- overall labs looking better 2 days ago 5/22- Hb 9.5 this AM!! 10. H/o recurrent GIB/[employer]- will only take blood products in absolute emergency per Renal note: Continue Protonix  BID 11. H/o Hypotension: Has had drop  in BP with HD. Now cervical SCI.  --Monitor for symptoms day after HD and with activity. -4/18 baseline blood pressure stable, monitor with activity 4/19- BP actually slightly elevated today- 140s-150's- con't  regimen, since can drop with HD and therapy 4/20- BP actually doing better today- in 100s'110s- however HR elevated, likely to fluid overload 4/21 nephrology started midodrine  10mg  before dialysis, BP appears to be doing a little better today 5/8- BP on low side in last 24 hours- likely made worse due to HD yesterday- denies OH Sx's. Con't to monitor trend 5/9- BP is running in 90's systolic- doesn't feel dizzy or lightheaded sitting, but could drop when standing-will d/w therapy 5/22- no Drops in BP-con't regimen Vitals:   07/03/23 1430 07/03/23 1500 07/03/23 1530 07/03/23 1600  BP: 129/79 129/74 (!) 144/81 122/72   07/03/23 1630 07/03/23 1631 07/03/23 1700 07/03/23 1720  BP: 129/79 120/79 112/71 102/75   07/03/23 1722 07/03/23 1847 07/03/23 2013 07/04/23 0545  BP: 113/71 105/61 122/81 102/71     12. Constipation: Small BM on 04/14 --had of nulytey yesterday and another today? -Xray 4/17 without excessive stool, patient reports stool still little hard yesterday.  Will increase MiraLAX  to 3 times a day, continue Senokot twice a day. Suppository PRN   06/09/23 LBM yesterday, cont regimen 4/28- LBM overnight- has been refusing miralax - made it prn- taking metamucil  5/18- LBM this morning per pt  5/20- LBM yesterday 13. ?left knee effusion. Not currently bothersome. Consider imaging.  14.  SIRS- Fever/hypotension/tachycardia - 4/21 Currently afebrile, he got to 50 cc IVF yesterday, CXR negative, EKG sinus tachycardia. Patient was started on Maxipime  and vancomycin  yesterday.  Appears to be doing much better today.  WBC not elevated at 7.1.  Continue to follow blood cultures 4/22- Blood Cx's (-) so far- WBC 7.3- will con't IV ABX until know final results of blood Cx's- CXR (-)- but temp was 103.2 yesterday overnight.  4/23- Blood C'x (-) x2 days so far- T 99.9 this Am after a few sips of coffee- was 100.4 this AM- will con't IV ABX since concerned about possible bacteremia- esp since  having low grade temps still 4/24- No more fevers, even low grade- no growth on blood Cx' bu it says that didn't get enough blood, so much be falsely negative 4/25- No growth for 5 days, but it also said not enough blood to be sure was a true negative. Will change stop date til tomorrow -06/08/23 at 3am temp recorded as 100.3 but 5am was 98.3; will get CBC w/diff, monitor temps closely today, if continued low grade temps, will reach out to ID. BCx NGTD x5d. Didn't get vanc yesterday? Or cefepime ? Unclear why. Cefepime  started this morning at 0011. -06/09/23 temp 100.3 at 3am yesterday and then 100 later in the day, but no fevers since, no tachycardia.  -WBC yesterday 8.9.  -Now with some confusion; spoke with Dr. Levern Reader of ID, stated cefepime  could be the cause of the confusion -recommended we stop  all abx (which ended yesterday anyway) -watch the patient OFF abx -if fevers >100.3 occur then she recommended repeat BCx and CXR and could call her back to see if any abx would be advised at that time.  -Head CT and dopplers ordered as well, as above 4/28- CT looks good- no fever, WBC is OK- off ABX 4/29- Also Dopplers (-) feeling good this AM 06/16/23 seems to have resolved. 15. Tachycardia- resolved 4/22- doing better- right at 100 this AM- will monitor- likely can be from dregs of infection vs OH. -will monitor  4/23- Tachycardia improving- 4/24-27 HR in 90's right now- doing better  4/29- 4/30- HR running in 90s-low 100's today 5/12 heart rate in the 70s and 80s primarily, continue to monitor 5/20- HR back into low 100's- but is feeling good- denies Sx's.  5/22- back to running 80s-high 90's- will monitor trend    07/04/2023    5:45 AM 07/03/2023    8:13 PM 07/03/2023    6:47 PM  Vitals with BMI  Systolic 102 122 161  Diastolic 71 81 61  Pulse 80 95 96    16. R hand swelling  4/23- doing ice- will do xrays 4/24- Xray doesn't look like cause of swelling, however could be due to new  shunt? 4/30- resolved 17. Hypocalcemia 4/24- his Albumin  is 2.1, so probably ok, and phos coming down- will leave to renal- thank you for their help  5/1- Ca 9.3 5/8- Ca 8.8 5/15- Ca 8.5 18. Hyperkalemia 4/28- K+ 6.3 today- got hypoosmotic bath to reduce K+ per note from RN/HD nurse? Also has Na of 127- being addressed by HD/renal- Phos also 5.8- but again, getting HD today 5/1- K+ down to 5.6 and Na 129 5/6- Na 133 and K+ down to 4.7 from 5.6 5/7- refused labs this AM- wants in HD 5/9- K_ came back as 4.5 5/12 potassium elevated today, chronic issue has dialysis today.  Treatment per renal team 5/14- K+ down to 5.7- from 6.2- has HD today 06/29/23 down to 5.3 yesterday, had dialysis yesterday.  5/19- HD today 5/20- K+ stayed at 5.3- doing better 5/22- K+ 4.1 19. R>L knee pain 4/29- will ask Ortho if they will tap his R knee- I have no problem doing injection of knee, however with so much fluid, I would rather Ortho tap him and then do steroid injection.   4/30- we added Prednisone  40 mg daily x 3 days- to see if helps knee pain  5/1- mild reduction in pain so far 5/6- Pt has short term improvement from Prednisone  dosing for knees- con't percocet prn 5/7- since pt was doing so much worse with therapy, asked Ortho to come and tap knee and take off fluid- do steroid injection- they drew off 100CC- 5 different draws required- fluid is pending, but has a lot of WBCs- no organisms on gram stain.   5/8- no growth for 24 hours- R knee pain so much better  -06/22/23 NGTD x3 days, monitor  5/14- No growth  - pain so much better after 100cc fluid removed and got injection 20.  R hip pain 5/16- will get R hip xray- also c/w trochanteric bursitis on exam- if doesn't improve, will do trochanteric bursa injection Tuesday -06/29/23 R hip xray with cystic changes of femoral head/neck, also degeneration on my review- will need ortho f/up but doubt need for emergent consultation. Dr. Christiane Cowing is his primary  ortho.  5/20- Dr Christiane Cowing wrote that he has severe arthritis, but needs  to be out of hospital to get hip replacement.    I spent a total of  35  minutes on total care today- >50% coordination of care- due to  Review of renal notes, therapy notes; labs and vitals- and d/w PT  LOS: 35 days A FACE TO FACE EVALUATION WAS PERFORMED  Vestal Crandall 07/04/2023, 9:50 AM

## 2023-07-04 NOTE — Progress Notes (Signed)
 Physical Therapy Session Note  Patient Details  Name: Carl Kettles Sr. MRN: [id number] Date of Birth: 09-20-1960  Today's Date: 07/04/2023 PT Individual Time: 9:45-11:00am (75 min)     Short Term Goals: Week 4:  PT Short Term Goal 1 (Week 4): Pt will consistantly be able to perform sit <> stands with max A PT Short Term Goal 1 - Progress (Week 4): Met (if elevated surface) PT Short Term Goal 2 (Week 4): Pt will be able to perform gait x 100' with mod A PT Short Term Goal 2 - Progress (Week 4): Met PT Short Term Goal 3 (Week 4): Pt will be able to perform bed mobility with supervision PT Short Term Goal 3 - Progress (Week 4): Met Week 5:  PT Short Term Goal 1 (Week 5): = LTGS due to ELOS  Skilled Therapeutic Interventions/Progress Updates:   Pt presents in bathroom on toilet and was able to perform sit > stand using grab bar with supervision with RW. Gait out of bathroom with RW with CGA and transferred back to his PWC. Functional mobility on unit with PWC at mod I level. Stair negotiation training for home entry practice, functional strengthening, balance, and coordination x 4 steps with bilateral rails with overall CGA and occasional tactile cues for R knee extension in stance and cues for technique. NMR for dynamic balance retraining in standing without UE support for ball toss (CGA to min A) and then with UE for support for kicking a ball with min A to CGA for balance. Repeated again with rotation randomly between ball toss and kicking for increased challenge of balance. Simulated car transfer with RW to SUV height with CGA to get into the car. Height was at 24" and pt was unable to stand without max A, raised up to 25" and then pt performed with CGA with RW and using seat for support. Pt performed gait on uneven surface up/down ramp with CGA with RW for community reentry and balance. Pt discussed which car his son can use if he is taking him to HD. End of session pt handoff to next therapist  for group.   Therapy Documentation Precautions:  Precautions Precautions: Fall, Cervical, Other (comment) (HD MWF) Recall of Precautions/Restrictions: Impaired Precaution/Restrictions Comments: Verbally reviewed cervical precautions. Required Braces or Orthoses: Cervical Brace Cervical Brace: Soft collar, At all times Restrictions Weight Bearing Restrictions Per Provider Order: No    Pain: Pain Assessment Pain Scale: 0-10 Pain Score: 0-No pain     Therapy/Group: Individual Therapy  Gita Lamb Amadeo June, PT, DPT, CBIS  07/04/2023, 8:13 AM

## 2023-07-05 LAB — RENAL FUNCTION PANEL
Albumin: 2.6 g/dL — ABNORMAL LOW (ref 3.5–5.0)
Anion gap: 13 (ref 5–15)
BUN: 58 mg/dL — ABNORMAL HIGH (ref 8–23)
CO2: 25 mmol/L (ref 22–32)
Calcium: 7.9 mg/dL — ABNORMAL LOW (ref 8.9–10.3)
Chloride: 95 mmol/L — ABNORMAL LOW (ref 98–111)
Creatinine, Ser: 7.98 mg/dL — ABNORMAL HIGH (ref 0.61–1.24)
GFR, Estimated: 7 mL/min — ABNORMAL LOW (ref >60)
Glucose, Bld: 114 mg/dL — ABNORMAL HIGH (ref 70–99)
Phosphorus: 5 mg/dL — ABNORMAL HIGH (ref 2.5–4.6)
Potassium: 4.5 mmol/L (ref 3.5–5.1)
Sodium: 133 mmol/L — ABNORMAL LOW (ref 135–145)

## 2023-07-05 LAB — CBC
HCT: 29.3 % — ABNORMAL LOW (ref 39.0–52.0)
Hemoglobin: 9.1 g/dL — ABNORMAL LOW (ref 13.0–17.0)
MCH: 27.4 pg (ref 26.0–34.0)
MCHC: 31.1 g/dL (ref 30.0–36.0)
MCV: 88.3 fL (ref 80.0–100.0)
Platelets: 237 10*3/uL (ref 150–400)
RBC: 3.32 MIL/uL — ABNORMAL LOW (ref 4.22–5.81)
RDW: 16.4 % — ABNORMAL HIGH (ref 11.5–15.5)
WBC: 4.8 10*3/uL (ref 4.0–10.5)
nRBC: 0 % (ref 0.0–0.2)

## 2023-07-05 NOTE — Progress Notes (Signed)
 Patient ID: Carl Nong Sr., male   DOB: 1960/08/03, 63 y.o.   MRN: [id number] Have begun insurance auth for transfer to [facility] on Tuesday. Reference number is [id number] have faxed clinicals to [facility] health and will await determination/approval. [employer] closed on Monday due to holiday, reason submitting today.

## 2023-07-05 NOTE — Plan of Care (Signed)
  Problem: Consults Goal: RH SPINAL CORD INJURY PATIENT EDUCATION Description:  See Patient Education module for education specifics.  Outcome: Progressing   Problem: SCI BOWEL ELIMINATION Goal: RH STG MANAGE BOWEL WITH ASSISTANCE Description: STG Manage Bowel with minimal Assistance. Outcome: Progressing Goal: RH STG SCI MANAGE BOWEL WITH MEDICATION WITH ASSISTANCE Description: STG SCI Manage bowel with medication with assistance. Outcome: Progressing   Problem: RH SKIN INTEGRITY Goal: RH STG SKIN FREE OF INFECTION/BREAKDOWN Description: Manage skin free of infection/breakdown with minimal assistance Outcome: Progressing   Problem: RH SAFETY Goal: RH STG ADHERE TO SAFETY PRECAUTIONS W/ASSISTANCE/DEVICE Description: STG Adhere to Safety Precautions With minimal Assistance/Device. Outcome: Progressing   Problem: RH PAIN MANAGEMENT Goal: RH STG PAIN MANAGED AT OR BELOW PT'S PAIN GOAL Description: Pain<4 w/ prns Outcome: Progressing   Problem: RH KNOWLEDGE DEFICIT SCI Goal: RH STG INCREASE KNOWLEDGE OF SELF CARE AFTER SCI Description: Manage increase knowledge of self care after SCI with minimal assistance using educational materials provided to son Outcome: Progressing

## 2023-07-05 NOTE — Progress Notes (Signed)
 Physical Therapy Session Note  Patient Details  Name: Carl Garner Sr. MRN: [id number] Date of Birth: [birth year]  Today's Date: 07/05/2023 PT Individual Time: 0915-1020 PT Individual Time Calculation (min): 65 min   Short Term Goals: Week 5:  PT Short Term Goal 1 (Week 5): = LTGS due to ELOS  Skilled Therapeutic Interventions/Progress Updates: Pt presented in PWC agreeable to therapy. Pt c/o mild L knee pain intermittently during session. Pt navigated mod I to day room and completed transfer with RW from 27in height to high/low mat. Pt then worked on mini-squats standing from 26in height x 10. Pt also worked on step ups to 4in step x 10 on LLE and x 3 on RLE however limited by pain. Pt also participated in standing dynamic balance activity participating in ball toss with 1.5lb weighted cuff on wrists. Pt able to complete tasks in 2-3 min bouts without seated rest breaks. Pt then progressed activity tapping ball with 2lb dowel and weights on wrists. Pt then ambulated back to room ~152ft with RW and CGA. Pt noted to have improved cadence and step length at this date. Pt returned to [facility] at end of session and left with call bell within reach and needs met.   Therapy Documentation Precautions:  Precautions Precautions: Fall, Cervical, Other (comment) (HD MWF) Recall of Precautions/Restrictions: Impaired Precaution/Restrictions Comments: Verbally reviewed cervical precautions. Required Braces or Orthoses: Cervical Brace Cervical Brace: Soft collar, At all times Restrictions Weight Bearing Restrictions Per Provider Order: No General:   Vital Signs: Therapy Vitals Temp: 97.8 F (36.6 C) Resp: 19 BP: 117/75 Patient Position (if appropriate): Lying Oxygen Therapy SpO2: 95 % O2 Device: Room Air Pain: Pain Assessment Pain Scale: 0-10 Pain Score: 0-No pain   Therapy/Group: Individual Therapy  Lasondra Hodgkins 07/05/2023, 10:53 AM

## 2023-07-05 NOTE — Progress Notes (Signed)
 PROGRESS NOTE   Subjective/Complaints:  Pt frustrated because they tried to do labs again this AM- said he got TH- so refused labs.   Said will get in HD today.   Asking about labs from yesterday- Hb up to 9.5  Still c/o R hip pain- interfering some with therapy ROS:    Pt denies SOB, abd pain, CP, N/V/C/D, and vision changes   B/L R>L knee pain (+)- R knee very improved +R hip pain-managed but severe arthritis  Objective:   No results found.      Recent Labs    07/04/23 0719  WBC 3.6*  HGB 9.5*  HCT 30.7*  PLT 262     Recent Labs    07/04/23 0719  NA 133*  K 4.1  CL 93*  CO2 25  GLUCOSE 86  BUN 33*  CREATININE 5.91*  CALCIUM  8.6*     Intake/Output Summary (Last 24 hours) at 07/05/2023 7829 Last data filed at 07/05/2023 0700 Gross per 24 hour  Intake 350 ml  Output --  Net 350 ml        Physical Exam: Vital Signs Blood pressure 117/75, pulse 88, temperature 97.8 F (36.6 C), resp. rate 19, height 6\' 1"  (1.854 m), weight 83.9 kg, SpO2 95%.       General: awake, alert, appropriate,up in w/c with OT in room;  NAD HENT: conjugate gaze; oropharynx moist CV: regular rate and rhythm; no JVD Pulmonary: CTA B/L; no W/R/R- good air movement GI: soft, NT, ND, (+)BS Psychiatric: appropriate- bright affect Neurological: Ox3  PRIOR EXAMS:MSK- R lateral and groin TTP- c/w trochanteric bursitis and R joint pain  Neurological: Ox3, follows commands, cranial nerves II through XII grossly intact,   MSK- R knee much less swollen MsK: B/L knees with effusions- less TTP on R knee this AM- looks good- mild effusion , no erythema, not really focally warm to touch- Both have effusions, - R hand swelling looks good/resolved  MSK- R hand swelling esp ulnar side of hand/dorsum and L wrist-stable this AM RUE- Biceps 4/5; WE 4-/5; Triceps 2/5; Grip 2-/5 and FA 1/5 LUE- 4/5 except triceps 3+ to  4-/5 RLE- HF 1 to 2-/5; KE 2-/5; DF/PF 2-/5 LLE- HF 3/5; KE 4-/5; DF/PF 4/5    Skin: C/D/I. Multiple lesions on BL legs with various stages of healing.  + RUE fistula - intact   MSK:      LLE knee with palpable effusion, no warmth, no ttp Mood: Pleasant affect, appropriate mood.  Neurologic exam: Alert and oriented x 4, fluent, follows commands, cranial nerves II through XII grossly intact, no hypertonia noted  Sensation: Reduced to light touch in distal right lower extremity to the mid-thigh       Strength:                RUE: 2/5 SA, 2/5 EF, 2/5 EE, 2/5 WE, 2/5 FF, 2/5 FA                LUE:  3/5 SA, 3/5 EF, 3/5 EE, 3/5 WE, 3/5 FF, 3/5 FA                RLE: 2/5  HF, 2/5 KE, 2/5  DF, 2/5  EHL, 2/5  PF                 LLE:  3/5 HF, 4/5 KE, 4/5  DF, 4/5  EHL, 4/5  PF  Neuroexam stable 4/21  Assessment/Plan: 1. Functional deficits which require 3+ hours per day of interdisciplinary therapy in a comprehensive inpatient rehab setting. Physiatrist is providing close team supervision and 24 hour management of active medical problems listed below. Physiatrist and rehab team continue to assess barriers to discharge/monitor patient progress toward functional and medical goals  Care Tool:  Bathing    Body parts bathed by patient: Right arm, Left arm, Chest, Abdomen, Front perineal area, Buttocks, Face, Left lower leg, Right lower leg, Left upper leg, Right upper leg   Body parts bathed by helper: Left arm, Front perineal area, Buttocks, Right lower leg, Left lower leg     Bathing assist Assist Level: Contact Guard/Touching assist     Upper Body Dressing/Undressing Upper body dressing   What is the patient wearing?: Pull over shirt    Upper body assist Assist Level: Supervision/Verbal cueing    Lower Body Dressing/Undressing Lower body dressing      What is the patient wearing?: Pants, Underwear/pull up     Lower body assist Assist for lower body dressing: Minimal Assistance  - Patient > 75%     Toileting Toileting Toileting Activity did not occur (Clothing management and hygiene only): N/A (no void or bm) (anuric, HD patient)  Toileting assist Assist for toileting: Moderate Assistance - Patient 50 - 74%     Transfers Chair/bed transfer  Transfers assist     Chair/bed transfer assist level: Contact Guard/Touching assist (elevated surface)     Locomotion Ambulation   Ambulation assist   Ambulation activity did not occur: Safety/medical concerns  Assist level: Contact Guard/Touching assist Assistive device: Walker-rolling Max distance: 125'   Walk 10 feet activity   Assist  Walk 10 feet activity did not occur: Safety/medical concerns  Assist level: Contact Guard/Touching assist Assistive device: Walker-rolling   Walk 50 feet activity   Assist Walk 50 feet with 2 turns activity did not occur: Safety/medical concerns  Assist level: Contact Guard/Touching assist Assistive device: Walker-rolling    Walk 150 feet activity   Assist Walk 150 feet activity did not occur: Safety/medical concerns  Assist level: 2 helpers      Walk 10 feet on uneven surface  activity   Assist Walk 10 feet on uneven surfaces activity did not occur: Safety/medical concerns         Wheelchair     Assist Is the patient using a wheelchair?: Yes Type of Wheelchair: Power    Wheelchair assist level: Independent Max wheelchair distance: 300    Wheelchair 50 feet with 2 turns activity    Assist        Assist Level: Independent   Wheelchair 150 feet activity     Assist      Assist Level: Independent   Blood pressure 117/75, pulse 88, temperature 97.8 F (36.6 C), resp. rate 19, height 6\' 1"  (1.854 m), weight 83.9 kg, SpO2 95%.  Medical Problem List and Plan: 1. 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  Con't CIR PT and OT- R hip pain still interferes  D/c to SNF 5/27- will con't to work on progressing gait distance and less A- also working on stairs, but needs 3 flights of stairs to get to home, is moving 7/1 to 1st floor apt.  2.  Antithrombotics: -DVT/anticoagulation:  Mechanical: Sequential compression devices, below knee Bilateral lower extremities             -antiplatelet therapy: N/A--no antiplatelet due to hx of recurrent GIB  4/25- d/w family- they agree that Lovenox  not appropriate at this time.   -06/09/23 ordered dopplers to r/o clots given low grade temps recently  4/28- LE Dopplers (-) for DVT 3. Pain Management:  oxycodone  prn. Gabapentin  300 mg/hs.              --has Flexeril  10 mg TID prn              - patient with intermittent, severe b/l headache - add topomax 25 mg BID    4/19- pt rating pain 07/19/08 which is "tolerable" 5/7- got Ortho to come and take fluid off R knee- got 100cc off- pain is no more than 1/10 this AM! 5/8-5/9 knee still doing well 5/13-5/14-  pain controlled- allowed him to walk further yesterday in therapy- still cannot do more than 1 step 5/16- will get R hip xray due to clunk and R hi pain -06/29/23 R hip xray with cystic changes of femoral head/neck, also degeneration on my review- will need ortho f/up but doubt need for emergent consultation. Dr. Christiane Cowing is his primary ortho 5/20- Pt's Ortho responded and knows about Deg arthritis in R hip- but cannot address while in hospital  4. Mood/Behavior/Sleep: LCSW to follow for evaluation and support             -antipsychotic agents: N/A             --melatonin prn for insomnia.  -06/09/23 poor sleep, d/t interruptions, asked nursing to minimize interruptions  between 11p-5a (apparently they can't put a sign up, but will communicate in huddle) 5. Neuropsych/cognition: This patient is capable of making decisions on his own behalf. 5/8- Ox3 and appropriate 6. Skin/Wound Care: Routine pressure relief measures.   4/19- due to ulcers on legs, on low air loss mattress 7. Fluids/Electrolytes/Nutrition: Strict I/O. Daily weights. Labs with HD. 8. ESRD- HD MWF: Schedule HD at the end of the day to help with tolerance of therapy             --Sensipar  and Renvela  for metabolic bone disease.   - Patient reports no longer makes urine  5/13 - last HD yesterday 5/15- last HD yesterday=- they were not able to finish due to equipment issues- will need to monitor fluid intake today- so can make it til tomorrow- they are not doing more today.  9. Anemia of chronic disease: Monitor H/H, aranesp  5/9- Hb 7.6 in last 24 hours- done yesterday- feels better- no dizziness or fatigue- so doesn't feel transfusion is appropriate 5/12 HGB up to 8.1 , improved continue to monitor.  He is on Aranesp  per nephrology 5/15- pt needs to have CBC rechecked in HD tomorrow-  -06/29/23 Hgb 7.8, stable. Monitor.  5/20- Hb 8.0 5/21- refused  labs this AM since getting in HD- overall labs looking better 2 days ago 5/22- Hb 9.5 this AM!! 5/23- pt refused labs again this AM 10. H/o recurrent GIB/[employer]- will only take blood products in absolute emergency per Renal note: Continue Protonix  BID 11. H/o Hypotension: Has had drop  in BP with HD. Now cervical SCI.  --Monitor for symptoms day after HD and with activity. -4/18 baseline blood pressure stable, monitor with activity 4/19- BP actually slightly elevated today- 140s-150's- con't regimen, since can drop with HD and therapy 4/20- BP actually doing better today- in 100s'110s- however HR elevated, likely to fluid overload 4/21 nephrology started midodrine  10mg  before dialysis, BP appears to be doing a little better today 5/8- BP on  low side in last 24 hours- likely made worse due to HD yesterday- denies OH Sx's. Con't to monitor trend 5/9- BP is running in 90's systolic- doesn't feel dizzy or lightheaded sitting, but could drop when standing-will d/w therapy 5/22- 5/23- no Drops in BP-con't regimen Vitals:   07/03/23 1530 07/03/23 1600 07/03/23 1630 07/03/23 1631  BP: (!) 144/81 122/72 129/79 120/79   07/03/23 1700 07/03/23 1720 07/03/23 1722 07/03/23 1847  BP: 112/71 102/75 113/71 105/61   07/03/23 2013 07/04/23 0545 07/04/23 2023 07/05/23 0700  BP: 122/81 102/71 122/81 117/75     12. Constipation: Small BM on 04/14 --had of nulytey yesterday and another today? -Xray 4/17 without excessive stool, patient reports stool still little hard yesterday.  Will increase MiraLAX  to 3 times a day, continue Senokot twice a day. Suppository PRN   06/09/23 LBM yesterday, cont regimen 4/28- LBM overnight- has been refusing miralax - made it prn- taking metamucil  5/18- LBM this morning per pt  5/20- LBM yesterday  5/23- LBM 5/20- needs to have a BM- since having HD today won't give Sorbitol , but will need Saturday if no BM 13. ?left knee effusion. Not currently bothersome. Consider imaging.  14.  SIRS- Fever/hypotension/tachycardia - 4/21 Currently afebrile, he got to 50 cc IVF yesterday, CXR negative, EKG sinus tachycardia. Patient was started on Maxipime  and vancomycin  yesterday.  Appears to be doing much better today.  WBC not elevated at 7.1.  Continue to follow blood cultures 4/22- Blood Cx's (-) so far- WBC 7.3- will con't IV ABX until know final results of blood Cx's- CXR (-)- but temp was 103.2 yesterday overnight.  4/23- Blood C'x (-) x2 days so far- T 99.9 this Am after a few sips of coffee- was 100.4 this AM- will con't IV ABX since concerned about possible bacteremia- esp since having low grade temps still 4/24- No more fevers, even low grade- no growth on blood Cx' bu it says that didn't get enough blood, so much  be falsely negative 4/25- No growth for 5 days, but it also said not enough blood to be sure was a true negative. Will change stop date til tomorrow -06/08/23 at 3am temp recorded as 100.3 but 5am was 98.3; will get CBC w/diff, monitor temps closely today, if continued low grade temps, will reach out to ID. BCx NGTD x5d. Didn't get vanc yesterday? Or cefepime ? Unclear why. Cefepime  started this morning at 0011. -06/09/23 temp 100.3 at 3am yesterday and then 100 later in the day, but no fevers since, no tachycardia.  -WBC yesterday 8.9.  -Now with some confusion; spoke with Dr. Levern Reader of ID, stated cefepime  could be the cause of the confusion -recommended we stop all abx (which ended yesterday anyway) -watch the  patient OFF abx -if fevers >100.3 occur then she recommended repeat BCx and CXR and could call her back to see if any abx would be advised at that time.  -Head CT and dopplers ordered as well, as above 4/28- CT looks good- no fever, WBC is OK- off ABX 4/29- Also Dopplers (-) feeling good this AM 06/16/23 seems to have resolved. 15. Tachycardia- resolved 4/22- doing better- right at 100 this AM- will monitor- likely can be from dregs of infection vs OH. -will monitor  4/23- Tachycardia improving- 4/24-27 HR in 90's right now- doing better  4/29- 4/30- HR running in 90s-low 100's today 5/12 heart rate in the 70s and 80s primarily, continue to monitor 5/20- HR back into low 100's- but is feeling good- denies Sx's.  5/22- back to running 80s-high 90's- will monitor trend    07/05/2023    7:00 AM 07/04/2023    8:23 PM 07/04/2023    5:17 PM  Vitals with BMI  Weight 184 lbs 15 oz  186 lbs 12 oz  Systolic 117 122   Diastolic 75 81   Pulse  88     16. R hand swelling  4/23- doing ice- will do xrays 4/24- Xray doesn't look like cause of swelling, however could be due to new shunt? 4/30- resolved 17. Hypocalcemia 4/24- his Albumin  is 2.1, so probably ok, and phos coming down- will leave to  renal- thank you for their help  5/1- Ca 9.3 5/8- Ca 8.8 5/15- Ca 8.5 18. Hyperkalemia 4/28- K+ 6.3 today- got hypoosmotic bath to reduce K+ per note from RN/HD nurse? Also has Na of 127- being addressed by HD/renal- Phos also 5.8- but again, getting HD today 5/1- K+ down to 5.6 and Na 129 5/6- Na 133 and K+ down to 4.7 from 5.6 5/7- refused labs this AM- wants in HD 5/9- K_ came back as 4.5 5/12 potassium elevated today, chronic issue has dialysis today.  Treatment per renal team 5/14- K+ down to 5.7- from 6.2- has HD today 06/29/23 down to 5.3 yesterday, had dialysis yesterday.  5/19- HD today 5/20- K+ stayed at 5.3- doing better 5/22- K+ 4.1 19. R>L knee pain 4/29- will ask Ortho if they will tap his R knee- I have no problem doing injection of knee, however with so much fluid, I would rather Ortho tap him and then do steroid injection.   4/30- we added Prednisone  40 mg daily x 3 days- to see if helps knee pain  5/1- mild reduction in pain so far 5/6- Pt has short term improvement from Prednisone  dosing for knees- con't percocet prn 5/7- since pt was doing so much worse with therapy, asked Ortho to come and tap knee and take off fluid- do steroid injection- they drew off 100CC- 5 different draws required- fluid is pending, but has a lot of WBCs- no organisms on gram stain.   5/8- no growth for 24 hours- R knee pain so much better  -06/22/23 NGTD x3 days, monitor  5/14- No growth  - pain so much better after 100cc fluid removed and got injection 20.  R hip pain 5/16- will get R hip xray- also c/w trochanteric bursitis on exam- if doesn't improve, will do trochanteric bursa injection Tuesday -06/29/23 R hip xray with cystic changes of femoral head/neck, also degeneration on my review- will need ortho f/up but doubt need for emergent consultation. Dr. Christiane Cowing is his primary ortho.  5/20- Dr Christiane Cowing wrote that he has severe arthritis,  but needs to be out of hospital to get hip replacement.    Will  need Sorbitol  Saturday if no BM    LOS: 36 days A FACE TO FACE EVALUATION WAS PERFORMED  Muzammil Bruins 07/05/2023, 8:32 AM

## 2023-07-05 NOTE — Progress Notes (Signed)
 Occupational Therapy Session Note  Patient Details  Name: Carl Diehl Sr. MRN: [id number] Date of Birth: [birth year]  Today's Date: 07/05/2023 OT Individual Time: 4098-1191 OT Individual Time Calculation (min): 75 min    Short Term Goals: Week 5:  OT Short Term Goal 1 (Week 5): Pt will complete toileting at Min A with AE as necessary OT Short Term Goal 2 (Week 5): Pt will complete bathing at [facility] with AE as necessary  Skilled Therapeutic Interventions/Progress Updates:   Pt greeted seated EOB typing his shoes, pt agreeable to OT intervention.      Transfers/bed mobility/functional mobility: pt completed ambulatory transfer into PWC with RW and CGA. Pt stands from heavily elevated EOB with supervision.   ADLs:   Footwear: pt was able to tie L shoe from EOB but needed assist to tie R shoe.      Exercises: pt completed below BUE therex to improved global strength and endurance for higher level ADLs:   2 mins of rows with level 2 theraband from seated position on mat with theraband looped around each hand, emphasis on upper trap strengthening 2 mins of seated bicep curls with level 2 theraband  2 mins of upright rows   Worked on sit>stands from EOM with mat at 23.5 inches, pt stood best with pt pushing up from float yoga blocks. Pt able to stand from this height with MIN- MODA.   Pt able to maintain standing balance while completing 1 min of forward rows with 3lb dowel rod for UB strengthening.   Pt able to also stand from 22 inch mat with pt pushing up from yoga blocks with MIN- MODA.    Ended session with pt seated in PWC with all needs within reach.   Therapy Documentation Precautions:  Precautions Precautions: Fall, Cervical, Other (comment) (HD MWF) Recall of Precautions/Restrictions: Impaired Precaution/Restrictions Comments: Verbally reviewed cervical precautions. Required Braces or Orthoses: Cervical Brace Cervical Brace: Soft collar, At all  times Restrictions Weight Bearing Restrictions Per Provider Order: No  Pain: Unrated pain reported in R hip, rest breaks provided as needed.     Therapy/Group: Individual Therapy  Willadean Hark 07/05/2023, 12:14 PM

## 2023-07-05 NOTE — Progress Notes (Signed)
 [facility] Progress Note   Subjective:   Seen and examined on dialysis.  Blood pressure 114/75 and HR 84.  Tolerating goal.  Procedure supervised. RUE AVF in use.  He feels good.  Thinks he'll be discharged early next week (maybe Tuesday?) so Monday may be last inpatient treatment here.    Review of systems:  Denies shortness of breath or chest pain  Denies n/v     Objective Vitals:   07/04/23 2023 07/05/23 0700 07/05/23 1340 07/05/23 1345  BP: 122/81 117/75 124/78 125/83  Pulse: 88  85 89  Resp: 18 19 17 20   Temp: 98.1 F (36.7 C) 97.8 F (36.6 C) 97.8 F (36.6 C)   TempSrc:      SpO2: 100% 95% 99% 100%  Weight:  83.9 kg 87.6 kg   Height:       Physical Exam   General adult male in bed in no acute distress HEENT normocephalic atraumatic extraocular movements intact sclera anicteric Neck C-spine brace on; trachea midline Lungs clear to auscultation bilaterally normal work of breathing at rest on room air Heart S1S2 no rub Abdomen soft nontender nondistended Extremities no edema Psych normal mood and affect Neuro - alert and oriented x 3 provides hx and follows commands  Access:  RUE AVF in use  Additional Objective Labs: Basic Metabolic Panel: Recent Labs  Lab 06/28/23 1500 07/01/23 1537 07/04/23 0719  NA 133* 132* 133*  K 5.3* 5.3* 4.1  CL 91* 94* 93*  CO2 24 22 25   GLUCOSE 105* 90 86  BUN 73* 78* 33*  CREATININE 9.02* 9.69* 5.91*  CALCIUM  8.3* 7.6* 8.6*  PHOS 5.0* 4.8* 3.9   Liver Function Tests: Recent Labs  Lab 06/28/23 1500 07/01/23 1537 07/04/23 0719  ALBUMIN  2.5* 2.5* 2.8*   No results for input(s): "LIPASE", "AMYLASE" in the last 168 hours. CBC: Recent Labs  Lab 06/28/23 1500 07/01/23 1537 07/04/23 0719  WBC 5.3 5.8 3.6*  NEUTROABS  --  3.5  --   HGB 7.8* 8.0* 9.5*  HCT 24.6* 25.0* 30.7*  MCV 89.8 89.3 89.5  PLT 278 260 262   Blood Culture    Component Value Date/Time   SDES FLUID 06/18/2023 1357   SPECREQUEST  NONE 06/18/2023 1357   CULT  06/18/2023 1357    NO GROWTH 3 DAYS Performed at [facility], [address] [place], [place] [address]    REPTSTATUS 06/22/2023 FINAL 06/18/2023 1357    Cardiac Enzymes: No results for input(s): "CKTOTAL", "CKMB", "CKMBINDEX", "TROPONINI" in the last 168 hours. CBG: Recent Labs  Lab 07/03/23 1850  GLUCAP 147*   Iron Studies: No results for input(s): "IRON", "TIBC", "TRANSFERRIN", "FERRITIN" in the last 72 hours. @lablastinr3 @ Studies/Results: No results found. Medications:    (feeding supplement) PROSource Plus  30 mL Oral BID BM   atorvastatin   10 mg Oral Q Thu   calcitRIOL   1.5 mcg Oral Q M,W,F-HD   carbamide peroxide  5 drop Right EAR BID   Chlorhexidine  Gluconate Cloth  6 each Topical Q0600   cinacalcet   120 mg Oral Q supper   [START ON 07/09/2023] darbepoetin (ARANESP ) injection - DIALYSIS  200 mcg Subcutaneous Q Tue-1800   gabapentin   300 mg Oral QHS   Gerhardt's butt cream   Topical BID   pantoprazole   40 mg Oral BID AC   psyllium  1 packet Oral TID   senna-docusate  2 tablet Oral BID   sevelamer  carbonate  3,200 mg Oral TID WC  sodium zirconium cyclosilicate   10 g Oral Daily   topiramate   25 mg Oral BID    Dialysis Orders: OP Dialysis Orders [facility] - MWF 4h  B400   85.5kg   2K bath  AVF   - Heparin  3000 units   - Mircera 60mcg IV q 2 weeks- last dose 05/24/23  - Calcitriol  1.5mcg PO q HD  Assessment/Plan: 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  Will follow up his labs from today and then can defer labs over weekend. Hyperkalemia:Chronic issue.  On Lokelma  daily now. Continue to reinforce renal diet. HTN/volume:optimize volume status with HD. Controlled.  He feels like he got off the fluid that had built up.  Anemia of ESRD:  have ordered to further increase to aranesp  200 mcg weekly on Tuesdays for next dose on 5/27 - may need to back down to 150 mcg again  depending on trends.  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   Secondary HPTH: continue sevelamer  and sensipar . Resume calcitriol   Nutrition: Alb 2.5, continue protein supplements. Patient reports appetite is better  Dispo - per rehab. Pt states next week he will be discharged from rehab he thinks   [name], MD 07/05/2023 2:16 PM

## 2023-07-05 NOTE — Progress Notes (Signed)
 Physical Therapy Weekly Progress Note  Patient Details  Name: Carl Encinas Sr. MRN: [id number] Date of Birth: 09-23-1960  Beginning of progress report period: Jun 29, 2023 End of progress report period: Jul 05, 2023  Today's Date: 07/05/2023 PT Individual Time: 1110-1205 PT Individual Time Calculation (min): 55 min   Short term goals were not set last reporting period due to anticipation of discharging to SNF prior, but due to coordination with outpatient HD, d/c is now scheduled for 5/27. Pt continues to make great progress overall functionally. He is at an overall min assist level for transfers and gait with RW including up to 4 stairs with bilateral rails. Pt does have difficulty with sit <> stands from any surface lower than 24-25" height (up to mod/max A), so this is limiting is carryover of transfers to all surfaces as well as continues to use PWC for independence for transfers and safe mobility at mod I level. Pt is independent with pressure relief in PWC and management of leg rests and arm rests for transfers. Pt demonstrates decreased strength, stability and pain in RLE due to knee and hip OA (premorbid) with some buckling noted at times (plan for f/u as OP per ortho).   Patient continues to demonstrate the following deficits muscle weakness and muscle joint tightness, decreased cardiorespiratoy endurance, abnormal tone, unbalanced muscle activation, and decreased coordination, and decreased standing balance, decreased postural control, and decreased balance strategies and therefore will continue to benefit from skilled PT intervention to increase functional independence with mobility.  Patient progressing toward long term goals..  Continue plan of care.  PT Short Term Goals Week 4:  PT Short Term Goal 1 (Week 4): Pt will consistantly be able to perform sit <> stands with max A PT Short Term Goal 1 - Progress (Week 4): Met (if elevated surface) PT Short Term Goal 2 (Week 4): Pt will be  able to perform gait x 100' with mod A PT Short Term Goal 2 - Progress (Week 4): Met PT Short Term Goal 3 (Week 4): Pt will be able to perform bed mobility with supervision PT Short Term Goal 3 - Progress (Week 4): Met Week 5:  PT Short Term Goal 1 (Week 5): = LTGS due to ELOS Week 6:  PT Short Term Goal 1 (Week 6): = LTGs  Skilled Therapeutic Interventions/Progress Updates:  Ambulation/gait training;Cognitive remediation/compensation;Discharge planning;DME/adaptive equipment instruction;Functional mobility training;Pain management;Psychosocial support;Therapeutic Activities;Splinting/orthotics;UE/LE Strength taining/ROM;Visual/perceptual remediation/compensation;Wheelchair Designer, jewellery;Therapeutic Exercise;Skin care/wound management;Patient/family education;Neuromuscular re-education;Disease management/prevention;Balance/vestibular training;Community reintegration;Functional electrical stimulation     Session focused on community mobility and education in return to community in future. Pt performed PWC mobility off unit including navigation of uneven surfaces and elevator at mod I level. Functional gait training outside on uneven surfaces including concrete, slope and brick with overall CGA with RW > 300'. Discussed seating in community setting and concern in regard to his ability to perform sit <> stands at this time due to lower surfaces. Trialled from 22.5" height of PWC and pt able to perform with extra time and min A!! Measured height of bench outside and was 17.5", so did not trial without a back up option to get off the bench but plan to continue to work on this as pt realizes this as a barrier.   Therapy Documentation Precautions:  Precautions Precautions: Fall, Cervical, Other (comment) (HD MWF) Recall of Precautions/Restrictions: Impaired Precaution/Restrictions Comments: Verbally reviewed cervical precautions. Required Braces or  Orthoses: Cervical Brace Cervical Brace: Soft collar, At all  times Restrictions Weight Bearing Restrictions Per Provider Order: No  Pain:  Denies pain.   Therapy/Group: Individual Therapy  Gita Lamb Amadeo June, PT, DPT, CBIS  07/05/2023, 8:04 AM

## 2023-07-05 NOTE — Progress Notes (Signed)
 Contacted [facility] to be advised that plan remains for pt to d/c on Tuesday to [facility] and resume care on Wed at new, later appt time. Will assist as needed.   Lauraine Polite Renal Navigator [phone]

## 2023-07-06 DIAGNOSIS — K5901 Slow transit constipation: Secondary | ICD-10-CM

## 2023-07-06 DIAGNOSIS — I9589 Other hypotension: Secondary | ICD-10-CM

## 2023-07-06 MED ORDER — MENTHOL 3 MG MT LOZG
1.0000 | LOZENGE | OROMUCOSAL | Status: DC | PRN
  Administered 2023-07-06 – 2023-07-07 (×5): 3 mg via ORAL
  Filled 2023-07-06 (×2): qty 9

## 2023-07-06 NOTE — Plan of Care (Signed)
  Problem: Consults Goal: RH SPINAL CORD INJURY PATIENT EDUCATION Description:  See Patient Education module for education specifics.  Outcome: Progressing   Problem: SCI BOWEL ELIMINATION Goal: RH STG MANAGE BOWEL WITH ASSISTANCE Description: STG Manage Bowel with minimal Assistance. Outcome: Progressing Goal: RH STG SCI MANAGE BOWEL WITH MEDICATION WITH ASSISTANCE Description: STG SCI Manage bowel with medication with assistance. Outcome: Progressing   Problem: RH SKIN INTEGRITY Goal: RH STG SKIN FREE OF INFECTION/BREAKDOWN Description: Manage skin free of infection/breakdown with minimal assistance Outcome: Progressing   Problem: RH SAFETY Goal: RH STG ADHERE TO SAFETY PRECAUTIONS W/ASSISTANCE/DEVICE Description: STG Adhere to Safety Precautions With minimal Assistance/Device. Outcome: Progressing   Problem: RH PAIN MANAGEMENT Goal: RH STG PAIN MANAGED AT OR BELOW PT'S PAIN GOAL Description: Pain<4 w/ prns Outcome: Progressing   Problem: RH KNOWLEDGE DEFICIT SCI Goal: RH STG INCREASE KNOWLEDGE OF SELF CARE AFTER SCI Description: Manage increase knowledge of self care after SCI with minimal assistance using educational materials provided to son Outcome: Progressing

## 2023-07-06 NOTE — Progress Notes (Signed)
 PROGRESS NOTE   Subjective/Complaints:  Pt doing well, slept ok, pain well managed. LBM yesterday. Anuric, dialysis yesterday was fine. Denies any other complaints or concerns.   ROS:    Pt denies SOB, abd pain, CP, N/V/C/D, and vision changes   B/L R>L knee pain (+)- R knee very improved +R hip pain-managed but severe arthritis  Objective:   No results found.      Recent Labs    07/04/23 0719 07/05/23 2107  WBC 3.6* 4.8  HGB 9.5* 9.1*  HCT 30.7* 29.3*  PLT 262 237     Recent Labs    07/04/23 0719 07/05/23 1332  NA 133* 133*  K 4.1 4.5  CL 93* 95*  CO2 25 25  GLUCOSE 86 114*  BUN 33* 58*  CREATININE 5.91* 7.98*  CALCIUM  8.6* 7.9*     Intake/Output Summary (Last 24 hours) at 07/06/2023 1029 Last data filed at 07/06/2023 0740 Gross per 24 hour  Intake 1196 ml  Output 3.4 ml  Net 1192.6 ml        Physical Exam: Vital Signs Blood pressure 101/65, pulse 82, temperature 97.8 F (36.6 C), temperature source Oral, resp. rate 18, height 6\' 1"  (1.854 m), weight 86.6 kg, SpO2 100%.   General: awake, alert, appropriate, up at EOB talking with staff;  NAD HENT: conjugate gaze; oropharynx moist CV: regular rate and rhythm; no JVD Pulmonary: CTA B/L; no W/R/R- good air movement GI: soft, NT, ND, (+)BS Psychiatric: appropriate- bright affect Neurological: Ox3 Extremities: no edema noted this weekend, legs looking much better  PRIOR EXAMS:MSK- R lateral and groin TTP- c/w trochanteric bursitis and R joint pain  Neurological: Ox3, follows commands, cranial nerves II through XII grossly intact,   MSK- R knee much less swollen MsK: B/L knees with effusions- less TTP on R knee this AM- looks good- mild effusion , no erythema, not really focally warm to touch- Both have effusions, - R hand swelling looks good/resolved  MSK- R hand swelling esp ulnar side of hand/dorsum and L wrist-stable this  AM RUE- Biceps 4/5; WE 4-/5; Triceps 2/5; Grip 2-/5 and FA 1/5 LUE- 4/5 except triceps 3+ to 4-/5 RLE- HF 1 to 2-/5; KE 2-/5; DF/PF 2-/5 LLE- HF 3/5; KE 4-/5; DF/PF 4/5    Skin: C/D/I. Multiple lesions on BL legs with various stages of healing.  + RUE fistula - intact   MSK:      LLE knee with palpable effusion, no warmth, no ttp Mood: Pleasant affect, appropriate mood.  Neurologic exam: Alert and oriented x 4, fluent, follows commands, cranial nerves II through XII grossly intact, no hypertonia noted  Sensation: Reduced to light touch in distal right lower extremity to the mid-thigh       Strength:                RUE: 2/5 SA, 2/5 EF, 2/5 EE, 2/5 WE, 2/5 FF, 2/5 FA                LUE:  3/5 SA, 3/5 EF, 3/5 EE, 3/5 WE, 3/5 FF, 3/5 FA  RLE: 2/5 HF, 2/5 KE, 2/5  DF, 2/5  EHL, 2/5  PF                 LLE:  3/5 HF, 4/5 KE, 4/5  DF, 4/5  EHL, 4/5  PF  Neuroexam stable 4/21  Assessment/Plan: 1. Functional deficits which require 3+ hours per day of interdisciplinary therapy in a comprehensive inpatient rehab setting. Physiatrist is providing close team supervision and 24 hour management of active medical problems listed below. Physiatrist and rehab team continue to assess barriers to discharge/monitor patient progress toward functional and medical goals  Care Tool:  Bathing    Body parts bathed by patient: Right arm, Left arm, Chest, Abdomen, Front perineal area, Buttocks, Face, Left lower leg, Right lower leg, Left upper leg, Right upper leg   Body parts bathed by helper: Left arm, Front perineal area, Buttocks, Right lower leg, Left lower leg     Bathing assist Assist Level: Contact Guard/Touching assist     Upper Body Dressing/Undressing Upper body dressing   What is the patient wearing?: Pull over shirt    Upper body assist Assist Level: Supervision/Verbal cueing    Lower Body Dressing/Undressing Lower body dressing      What is the patient wearing?: Pants,  Underwear/pull up     Lower body assist Assist for lower body dressing: Minimal Assistance - Patient > 75%     Toileting Toileting Toileting Activity did not occur (Clothing management and hygiene only): N/A (no void or bm) (anuric, HD patient)  Toileting assist Assist for toileting: Moderate Assistance - Patient 50 - 74%     Transfers Chair/bed transfer  Transfers assist     Chair/bed transfer assist level: Contact Guard/Touching assist     Locomotion Ambulation   Ambulation assist   Ambulation activity did not occur: Safety/medical concerns  Assist level: Contact Guard/Touching assist Assistive device: Walker-rolling Max distance: 300'   Walk 10 feet activity   Assist  Walk 10 feet activity did not occur: Safety/medical concerns  Assist level: Contact Guard/Touching assist Assistive device: Walker-rolling   Walk 50 feet activity   Assist Walk 50 feet with 2 turns activity did not occur: Safety/medical concerns  Assist level: Contact Guard/Touching assist Assistive device: Walker-rolling    Walk 150 feet activity   Assist Walk 150 feet activity did not occur: Safety/medical concerns  Assist level: Contact Guard/Touching assist Assistive device: Walker-rolling    Walk 10 feet on uneven surface  activity   Assist Walk 10 feet on uneven surfaces activity did not occur: Safety/medical concerns   Assist level: Contact Guard/Touching assist Assistive device: Walker-rolling   Wheelchair     Assist Is the patient using a wheelchair?: Yes Type of Wheelchair: Power    Wheelchair assist level: Independent Max wheelchair distance: >500    Wheelchair 50 feet with 2 turns activity    Assist        Assist Level: Independent   Wheelchair 150 feet activity     Assist      Assist Level: Independent   Blood pressure 101/65, pulse 82, temperature 97.8 F (36.6 C), temperature source Oral, resp. rate 18, height 6\' 1"  (1.854 m),  weight 86.6 kg, SpO2 100%.  Medical Problem List and Plan: 1. 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  Con't CIR PT and OT- R hip pain still interferes D/c to SNF 5/27- will con't to work on progressing gait distance and less A- also working on stairs, but needs 3 flights of stairs to get to home, is moving 7/1 to 1st floor apt.  2.  Antithrombotics: -DVT/anticoagulation:  Mechanical: Sequential compression devices, below knee Bilateral lower extremities             -antiplatelet therapy: N/A--no antiplatelet due to hx of recurrent GIB  4/25- d/w family- they agree that Lovenox  not appropriate at this time.   -06/09/23 ordered dopplers to r/o clots given low grade temps recently  4/28- LE Dopplers (-) for DVT 3. Pain Management:  oxycodone  prn. Gabapentin  300 mg/hs.              --has Flexeril  10 mg TID prn              - patient with intermittent, severe b/l headache - add topomax 25 mg BID    4/19- pt rating pain 07/19/08 which is "tolerable" 5/7- got Ortho to come and take fluid off R knee- got 100cc off- pain is no more than 1/10 this AM! 5/8-5/9 knee still doing well 5/13-5/14-  pain controlled- allowed him to walk further yesterday in therapy- still cannot do more than 1 step 5/16- will get R hip xray due to clunk and R hi pain -06/29/23 R hip xray with cystic changes of femoral head/neck, also degeneration on my review- will need ortho f/up but doubt need for emergent consultation. Dr. Christiane Cowing is his primary ortho 5/20- Pt's Ortho responded and knows about Deg arthritis in R hip- but cannot address while in hospital  4.  Mood/Behavior/Sleep: LCSW to follow for evaluation and support             -antipsychotic agents: N/A             --melatonin prn for insomnia.  -06/09/23 poor sleep, d/t interruptions, asked nursing to minimize interruptions between 11p-5a (apparently they can't put a sign up, but will communicate in huddle) 5. Neuropsych/cognition: This patient is capable of making decisions on his own behalf. 5/8- Ox3 and appropriate 6. Skin/Wound Care: Routine pressure relief measures.   4/19- due to ulcers on legs, on low air loss mattress 7. Fluids/Electrolytes/Nutrition: Strict I/O. Daily weights. Labs with HD. 8. ESRD- HD MWF: Schedule HD at the end of the day to help with tolerance of therapy             --Sensipar  and Renvela  for metabolic bone disease.   - Patient reports no longer makes urine  5/13 - last HD yesterday 5/15- last HD yesterday=- they were not able to finish due to equipment issues- will need to monitor fluid intake today- so can make it til tomorrow- they are not doing more today.  9. Anemia of chronic disease: Monitor H/H, aranesp  5/9- Hb 7.6 in last 24 hours- done yesterday- feels better- no dizziness or fatigue- so doesn't feel transfusion is appropriate 5/12 HGB up to 8.1 , improved continue to monitor.  He is on Aranesp  per nephrology 5/15- pt needs to have CBC rechecked in HD tomorrow-  -06/29/23 Hgb 7.8,  stable. Monitor.  5/20- Hb 8.0 5/21- refused labs this AM since getting in HD- overall labs looking better 2 days ago 5/22- Hb 9.5 this AM!! 07/06/23 Hgb 9.1 yesterday; monitor 10. H/o recurrent GIB/[employer]- will only take blood products in absolute emergency per Renal note: Continue Protonix  BID 11. H/o Hypotension: Has had drop  in BP with HD. Now cervical SCI.  --Monitor for symptoms day after HD and with activity. -4/18 baseline blood pressure stable, monitor with activity 4/19- BP actually slightly elevated today- 140s-150's- con't regimen, since can drop with  HD and therapy 4/20- BP actually doing better today- in 100s'110s- however HR elevated, likely to fluid overload 4/21 nephrology started midodrine  10mg  before dialysis, BP appears to be doing a little better today 5/8- BP on low side in last 24 hours- likely made worse due to HD yesterday- denies OH Sx's. Con't to monitor trend 5/9- BP is running in 90's systolic- doesn't feel dizzy or lightheaded sitting, but could drop when standing-will d/w therapy 5/22- 5/24- no Drops in BP-con't regimen Vitals:   07/05/23 0700 07/05/23 1340 07/05/23 1345 07/05/23 1430  BP: 117/75 124/78 125/83 101/70   07/05/23 1500 07/05/23 1530 07/05/23 1600 07/05/23 1630  BP: 102/87 99/69 130/82 122/82   07/05/23 1707 07/05/23 1708 07/05/23 1856 07/06/23 0353  BP: 131/78 121/75 121/74 101/65     12. Constipation: Small BM on 04/14 --had of nulytey yesterday and another today? -Xray 4/17 without excessive stool, patient reports stool still little hard yesterday.  Will increase MiraLAX  to 3 times a day, continue Senokot twice a day. Suppository PRN   06/09/23 LBM yesterday, cont regimen 4/28- LBM overnight- has been refusing miralax - made it prn- taking metamucil  5/18- LBM this morning per pt  5/20- LBM yesterday 5/23- LBM 5/20- needs to have a BM- since having HD today won't give Sorbitol , but will need Saturday if no BM -07/06/23 LBM yesterday, monitor 13. ?left knee effusion. Not currently bothersome. Consider imaging.  14.  SIRS- Fever/hypotension/tachycardia - 4/21 Currently afebrile, he got to 50 cc IVF yesterday, CXR negative, EKG sinus tachycardia. Patient was started on Maxipime  and vancomycin  yesterday.  Appears to be doing much better today.  WBC not elevated at 7.1.  Continue to follow blood cultures 4/22- Blood Cx's (-) so far- WBC 7.3- will con't IV ABX until know final results of blood Cx's- CXR (-)- but temp was 103.2 yesterday overnight.  4/23- Blood C'x (-) x2 days so far- T 99.9 this Am  after a few sips of coffee- was 100.4 this AM- will con't IV ABX since concerned about possible bacteremia- esp since having low grade temps still 4/24- No more fevers, even low grade- no growth on blood Cx' bu it says that didn't get enough blood, so much be falsely negative 4/25- No growth for 5 days, but it also said not enough blood to be sure was a true negative. Will change stop date til tomorrow -06/08/23 at 3am temp recorded as 100.3 but 5am was 98.3; will get CBC w/diff, monitor temps closely today, if continued low grade temps, will reach out to ID. BCx NGTD x5d. Didn't get vanc yesterday? Or cefepime ? Unclear why. Cefepime  started this morning at 0011. -06/09/23 temp 100.3 at 3am yesterday and then 100 later in the day, but no fevers since, no tachycardia.  -WBC yesterday 8.9.  -Now with some confusion; spoke with Dr. Levern Reader of ID, stated cefepime  could be the cause of the confusion -recommended we stop  all abx (which ended yesterday anyway) -watch the patient OFF abx -if fevers >100.3 occur then she recommended repeat BCx and CXR and could call her back to see if any abx would be advised at that time.  -Head CT and dopplers ordered as well, as above 4/28- CT looks good- no fever, WBC is OK- off ABX 4/29- Also Dopplers (-) feeling good this AM 06/16/23 seems to have resolved. 15. Tachycardia- resolved 4/22- doing better- right at 100 this AM- will monitor- likely can be from dregs of infection vs OH. -will monitor  4/23- Tachycardia improving- 4/24-27 HR in 90's right now- doing better  4/29- 4/30- HR running in 90s-low 100's today 5/12 heart rate in the 70s and 80s primarily, continue to monitor 5/20- HR back into low 100's- but is feeling good- denies Sx's.  5/22- back to running 80s-high 90's- will monitor trend    07/06/2023    3:53 AM 07/06/2023    3:51 AM 07/05/2023    6:56 PM  Vitals with BMI  Weight  190 lbs 15 oz   Systolic 101  121  Diastolic 65  74  Pulse 82  101     16. R hand swelling  4/23- doing ice- will do xrays 4/24- Xray doesn't look like cause of swelling, however could be due to new shunt? 4/30- resolved 17. Hypocalcemia 4/24- his Albumin  is 2.1, so probably ok, and phos coming down- will leave to renal- thank you for their help  5/1- Ca 9.3 5/8- Ca 8.8 5/15- Ca 8.5 18. Hyperkalemia 4/28- K+ 6.3 today- got hypoosmotic bath to reduce K+ per note from RN/HD nurse? Also has Na of 127- being addressed by HD/renal- Phos also 5.8- but again, getting HD today 5/1- K+ down to 5.6 and Na 129 5/6- Na 133 and K+ down to 4.7 from 5.6 5/7- refused labs this AM- wants in HD 5/9- K_ came back as 4.5 5/12 potassium elevated today, chronic issue has dialysis today.  Treatment per renal team 5/14- K+ down to 5.7- from 6.2- has HD today 06/29/23 down to 5.3 yesterday, had dialysis yesterday.  5/19- HD today 5/20- K+ stayed at 5.3- doing better 5/24- K+ 4.5 yesterday 19. R>L knee pain 4/29- will ask Ortho if they will tap his R knee- I have no problem doing injection of knee, however with so much fluid, I would rather Ortho tap him and then do steroid injection.   4/30- we added Prednisone  40 mg daily x 3 days- to see if helps knee pain  5/1- mild reduction in pain so far 5/6- Pt has short term improvement from Prednisone  dosing for knees- con't percocet prn 5/7- since pt was doing so much worse with therapy, asked Ortho to come and tap knee and take off fluid- do steroid injection- they drew off 100CC- 5 different draws required- fluid is pending, but has a lot of WBCs- no organisms on gram stain.   5/8- no growth for 24 hours- R knee pain so much better  -06/22/23 NGTD x3 days, monitor  5/14- No growth  - pain so much better after 100cc fluid removed and got injection 20.  R hip pain 5/16- will get R hip xray- also c/w trochanteric bursitis on exam- if doesn't improve, will do trochanteric bursa injection Tuesday -06/29/23 R hip xray with cystic  changes of femoral head/neck, also degeneration on my review- will need ortho f/up but doubt need for emergent consultation. Dr. Christiane Cowing is his primary ortho.  5/20- Dr  Christiane Cowing wrote that he has severe arthritis, but needs to be out of hospital to get hip replacement.       LOS: 37 days A FACE TO FACE EVALUATION WAS PERFORMED  [address] 07/06/2023, 10:29 AM

## 2023-07-06 NOTE — Progress Notes (Addendum)
 [name] Progress Note   Subjective:    Seen and examined patient at bedside. He feels well. Tolerated yesterday's HD with net UF 3.4L. Next HD 5/26.  Objective Vitals:   07/05/23 1856 07/06/23 0351 07/06/23 0353 07/06/23 1431  BP: 121/74  101/65 116/80  Pulse: (!) 101  82 75  Resp: 18  18 18   Temp: 98.4 F (36.9 C)  97.8 F (36.6 C) 98.7 F (37.1 C)  TempSrc: Oral  Oral   SpO2: 100%  100% 100%  Weight:  86.6 kg    Height:       Physical Exam General adult male in wheelcahir in no acute distress Neck C-spine brace on; trachea midline Lungs clear to auscultation bilaterally normal work of breathing at rest on room air Heart S1S2 no rub Abdomen soft nontender nondistended Extremities no edema Neuro - alert and oriented x 3 provides hx and follows commands  Access:  RUE AVF (+) B/T  Filed Weights   07/05/23 1340 07/05/23 1804 07/06/23 0351  Weight: 87.6 kg 83.9 kg 86.6 kg    Intake/Output Summary (Last 24 hours) at 07/06/2023 1700 Last data filed at 07/06/2023 1401 Gross per 24 hour  Intake 1200 ml  Output 3.4 ml  Net 1196.6 ml    Additional Objective Labs: Basic Metabolic Panel: Recent Labs  Lab 07/01/23 1537 07/04/23 0719 07/05/23 1332  NA 132* 133* 133*  K 5.3* 4.1 4.5  CL 94* 93* 95*  CO2 22 25 25   GLUCOSE 90 86 114*  BUN 78* 33* 58*  CREATININE 9.69* 5.91* 7.98*  CALCIUM  7.6* 8.6* 7.9*  PHOS 4.8* 3.9 5.0*   Liver Function Tests: Recent Labs  Lab 07/01/23 1537 07/04/23 0719 07/05/23 1332  ALBUMIN  2.5* 2.8* 2.6*   No results for input(s): "LIPASE", "AMYLASE" in the last 168 hours. CBC: Recent Labs  Lab 07/01/23 1537 07/04/23 0719 07/05/23 2107  WBC 5.8 3.6* 4.8  NEUTROABS 3.5  --   --   HGB 8.0* 9.5* 9.1*  HCT 25.0* 30.7* 29.3*  MCV 89.3 89.5 88.3  PLT 260 262 237   Blood Culture    Component Value Date/Time   SDES FLUID 06/18/2023 1357   SPECREQUEST NONE 06/18/2023 1357   CULT  06/18/2023 1357    NO GROWTH 3  DAYS Performed at [facility], [address] [place], [place] [address]    REPTSTATUS 06/22/2023 FINAL 06/18/2023 1357    Cardiac Enzymes: No results for input(s): "CKTOTAL", "CKMB", "CKMBINDEX", "TROPONINI" in the last 168 hours. CBG: Recent Labs  Lab 07/03/23 1850  GLUCAP 147*   Iron Studies: No results for input(s): "IRON", "TIBC", "TRANSFERRIN", "FERRITIN" in the last 72 hours. Lab Results  Component Value Date   INR 1.1 05/27/2023   INR 1.1 05/25/2023   INR 1.1 09/17/2021   Studies/Results: No results found.  Medications:   (feeding supplement) PROSource Plus  30 mL Oral BID BM   atorvastatin   10 mg Oral Q Thu   calcitRIOL   1.5 mcg Oral Q M,W,F-HD   carbamide peroxide  5 drop Right EAR BID   Chlorhexidine  Gluconate Cloth  6 each Topical Q0600   cinacalcet   120 mg Oral Q supper   [START ON 07/09/2023] darbepoetin (ARANESP ) injection - DIALYSIS  200 mcg Subcutaneous Q Tue-1800   gabapentin   300 mg Oral QHS   Gerhardt's butt cream   Topical BID   pantoprazole   40 mg Oral BID AC   psyllium  1 packet Oral TID  senna-docusate  2 tablet Oral BID   sevelamer  carbonate  3,200 mg Oral TID WC   sodium zirconium cyclosilicate   10 g Oral Daily   topiramate   25 mg Oral BID    Dialysis Orders: OP Dialysis Orders [facility] - MWF 4h  B400   85.5kg   2K bath  AVF   - Heparin  3000 units   - Mircera 60mcg IV q 2 weeks- last dose 05/24/23  - Calcitriol  1.5mcg PO q HD  Assessment/Plan: 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   Hyperkalemia:Chronic issue.  On Lokelma  daily now. Continue to reinforce renal diet. HTN/volume:optimize volume status with HD. Controlled.  He feels like he got off the fluid that had built up.  Anemia of ESRD:  have ordered to further increase to aranesp  200 mcg weekly on Tuesdays for next dose on 5/27 - may need to back down to 150 mcg again depending on trends.  He is a TEFL teacher  Witness  Secondary HPTH: continue sevelamer  and sensipar . Resume calcitriol   Nutrition: Alb 2.5, continue protein supplements. Patient reports appetite is better Dispo - Per rehab. Per patient, may be discharged early next week.  Carl Maxwell, NP [facility] 07/06/2023,5:00 PM  LOS: 37 days    Seen and examined independently.  Agree with note and exam as documented above by physician extender and as noted here.  Got to go off the floor today for a bit.  States HD went well.  Lab lost one of his blood tubes which especially frustrates him due to anemia  General adult male in bed in no acute distress HEENT normocephalic atraumatic extraocular movements intact sclera anicteric Neck supple trachea midline Lungs clear to auscultation bilaterally normal work of breathing at rest  Heart S1S2  Abdomen soft nontender nondistended Extremities no pitting edema  Psych normal mood and affect Neuro Alert and oriented x 3 provides hx and follows commands  Access RUE AVF with bruit and thrill   ESRD on HD MWF - defer labs this weekend   Anemia of CKD - on ESA.  Next labs Monday.  Dose adjustments to ESA as above   C4 fracture - per rehab   Carl Aver, MD 07/06/2023  5:13 PM

## 2023-07-06 NOTE — Plan of Care (Signed)
  Problem: Consults Goal: RH SPINAL CORD INJURY PATIENT EDUCATION Description:  See Patient Education module for education specifics.  Outcome: Progressing   Problem: SCI BOWEL ELIMINATION Goal: RH STG MANAGE BOWEL WITH ASSISTANCE Description: STG Manage Bowel with minimal Assistance. Outcome: Progressing

## 2023-07-07 MED ORDER — CHLORHEXIDINE GLUCONATE CLOTH 2 % EX PADS
6.0000 | MEDICATED_PAD | Freq: Every day | CUTANEOUS | Status: DC
  Administered 2023-07-08: 6 via TOPICAL

## 2023-07-07 NOTE — Plan of Care (Signed)
  Problem: Consults Goal: RH SPINAL CORD INJURY PATIENT EDUCATION Description:  See Patient Education module for education specifics.  Outcome: Progressing   Problem: SCI BOWEL ELIMINATION Goal: RH STG MANAGE BOWEL WITH ASSISTANCE Description: STG Manage Bowel with minimal Assistance. Outcome: Progressing

## 2023-07-07 NOTE — Progress Notes (Signed)
 PROGRESS NOTE   Subjective/Complaints:  Pt doing well today, slept ok, pain well managed. LBM yesterday per pt but not documented since 5/23. Anuric at baseline, dialysis tomorrow. Denies any other complaints or concerns.   ROS:    Pt denies SOB, abd pain, CP, N/V/C/D, and vision changes   B/L R>L knee pain (+)- R knee very improved +R hip pain-managed but severe arthritis  Objective:   No results found.      Recent Labs    07/05/23 2107  WBC 4.8  HGB 9.1*  HCT 29.3*  PLT 237     Recent Labs    07/05/23 1332  NA 133*  K 4.5  CL 95*  CO2 25  GLUCOSE 114*  BUN 58*  CREATININE 7.98*  CALCIUM  7.9*     Intake/Output Summary (Last 24 hours) at 07/07/2023 1124 Last data filed at 07/06/2023 1401 Gross per 24 hour  Intake 240 ml  Output --  Net 240 ml        Physical Exam: Vital Signs Blood pressure 114/70, pulse 80, temperature 97.7 F (36.5 C), resp. rate 18, height 6\' 1"  (1.854 m), weight 86.6 kg, SpO2 100%.   General: awake, alert, appropriate, up in w/c;  NAD HENT: conjugate gaze; oropharynx moist CV: regular rate and rhythm; no JVD Pulmonary: CTA B/L; no W/R/R- good air movement GI: soft, NT, ND, (+)BS Psychiatric: appropriate- bright affect Neurological: Ox3 Extremities: no edema noted this weekend, legs looking much better  PRIOR EXAMS:MSK- R lateral and groin TTP- c/w trochanteric bursitis and R joint pain  Neurological: Ox3, follows commands, cranial nerves II through XII grossly intact,   MSK- R knee much less swollen MsK: B/L knees with effusions- less TTP on R knee this AM- looks good- mild effusion , no erythema, not really focally warm to touch- Both have effusions, - R hand swelling looks good/resolved  MSK- R hand swelling esp ulnar side of hand/dorsum and L wrist-stable this AM RUE- Biceps 4/5; WE 4-/5; Triceps 2/5; Grip 2-/5 and FA 1/5 LUE- 4/5 except triceps 3+ to  4-/5 RLE- HF 1 to 2-/5; KE 2-/5; DF/PF 2-/5 LLE- HF 3/5; KE 4-/5; DF/PF 4/5    Skin: C/D/I. Multiple lesions on BL legs with various stages of healing.  + RUE fistula - intact   MSK:      LLE knee with palpable effusion, no warmth, no ttp Mood: Pleasant affect, appropriate mood.  Neurologic exam: Alert and oriented x 4, fluent, follows commands, cranial nerves II through XII grossly intact, no hypertonia noted  Sensation: Reduced to light touch in distal right lower extremity to the mid-thigh       Strength:                RUE: 2/5 SA, 2/5 EF, 2/5 EE, 2/5 WE, 2/5 FF, 2/5 FA                LUE:  3/5 SA, 3/5 EF, 3/5 EE, 3/5 WE, 3/5 FF, 3/5 FA                RLE: 2/5 HF, 2/5 KE, 2/5  DF, 2/5  EHL, 2/5  PF  LLE:  3/5 HF, 4/5 KE, 4/5  DF, 4/5  EHL, 4/5  PF  Neuroexam stable 4/21  Assessment/Plan: 1. Functional deficits which require 3+ hours per day of interdisciplinary therapy in a comprehensive inpatient rehab setting. Physiatrist is providing close team supervision and 24 hour management of active medical problems listed below. Physiatrist and rehab team continue to assess barriers to discharge/monitor patient progress toward functional and medical goals  Care Tool:  Bathing    Body parts bathed by patient: Right arm, Left arm, Chest, Abdomen, Front perineal area, Buttocks, Face, Left lower leg, Right lower leg, Left upper leg, Right upper leg   Body parts bathed by helper: Left arm, Front perineal area, Buttocks, Right lower leg, Left lower leg     Bathing assist Assist Level: Contact Guard/Touching assist     Upper Body Dressing/Undressing Upper body dressing   What is the patient wearing?: Pull over shirt    Upper body assist Assist Level: Supervision/Verbal cueing    Lower Body Dressing/Undressing Lower body dressing      What is the patient wearing?: Pants, Underwear/pull up     Lower body assist Assist for lower body dressing: Minimal Assistance  - Patient > 75%     Toileting Toileting Toileting Activity did not occur (Clothing management and hygiene only): N/A (no void or bm) (anuric, HD patient)  Toileting assist Assist for toileting: Moderate Assistance - Patient 50 - 74%     Transfers Chair/bed transfer  Transfers assist     Chair/bed transfer assist level: Contact Guard/Touching assist     Locomotion Ambulation   Ambulation assist   Ambulation activity did not occur: Safety/medical concerns  Assist level: Contact Guard/Touching assist Assistive device: Walker-rolling Max distance: 300'   Walk 10 feet activity   Assist  Walk 10 feet activity did not occur: Safety/medical concerns  Assist level: Contact Guard/Touching assist Assistive device: Walker-rolling   Walk 50 feet activity   Assist Walk 50 feet with 2 turns activity did not occur: Safety/medical concerns  Assist level: Contact Guard/Touching assist Assistive device: Walker-rolling    Walk 150 feet activity   Assist Walk 150 feet activity did not occur: Safety/medical concerns  Assist level: Contact Guard/Touching assist Assistive device: Walker-rolling    Walk 10 feet on uneven surface  activity   Assist Walk 10 feet on uneven surfaces activity did not occur: Safety/medical concerns   Assist level: Contact Guard/Touching assist Assistive device: Walker-rolling   Wheelchair     Assist Is the patient using a wheelchair?: Yes Type of Wheelchair: Power    Wheelchair assist level: Independent Max wheelchair distance: >500    Wheelchair 50 feet with 2 turns activity    Assist        Assist Level: Independent   Wheelchair 150 feet activity     Assist      Assist Level: Independent   Blood pressure 114/70, pulse 80, temperature 97.7 F (36.5 C), resp. rate 18, height 6\' 1"  (1.854 m), weight 86.6 kg, SpO2 100%.  Medical Problem List and Plan: 1. 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  Con't CIR PT and OT- R hip pain still interferes D/c to SNF 5/27- will con't to work on progressing gait distance and less A- also working on stairs, but needs 3 flights of stairs to get to home, is moving 7/1 to 1st floor apt.  2.  Antithrombotics: -DVT/anticoagulation:  Mechanical: Sequential compression devices, below knee Bilateral lower extremities             -antiplatelet therapy: N/A--no antiplatelet due to hx of recurrent GIB  4/25- d/w family- they agree that Lovenox  not appropriate at this time.   -06/09/23 ordered dopplers to r/o clots given low grade temps recently  4/28- LE Dopplers (-) for DVT 3. Pain Management:  oxycodone  prn. Gabapentin  300 mg/hs.              --has Flexeril  10 mg TID prn              - patient with intermittent, severe b/l headache - add topomax 25 mg BID    4/19- pt rating pain 07/19/08 which is "tolerable" 5/7- got Ortho to come and take fluid off R knee- got 100cc off- pain is no more than 1/10 this AM! 5/8-5/9 knee still doing well 5/13-5/14-  pain controlled- allowed him to walk further yesterday in therapy- still cannot do more than 1 step 5/16- will get R hip xray due to clunk and R hi pain -06/29/23 R hip xray with cystic changes of femoral head/neck, also degeneration on my review- will need ortho f/up but doubt need for emergent consultation. Dr. Christiane Cowing is his primary ortho 5/20- Pt's Ortho responded and knows about Deg arthritis in R hip- but cannot address while in hospital  4. Mood/Behavior/Sleep: LCSW to follow for evaluation and support             -antipsychotic agents: N/A              --melatonin prn for insomnia.  -06/09/23 poor sleep, d/t interruptions, asked nursing to minimize interruptions between 11p-5a-- sleeping better lately 5. Neuropsych/cognition: This patient is capable of making decisions on his own behalf. 5/8- Ox3 and appropriate 6. Skin/Wound Care: Routine pressure relief measures.   4/19- due to ulcers on legs, on low air loss mattress 7. Fluids/Electrolytes/Nutrition: Strict I/O. Daily weights. Labs with HD. 8. ESRD- HD MWF: Schedule HD at the end of the day to help with tolerance of therapy             --Sensipar  and Renvela  for metabolic bone disease.   - Patient reports no longer makes urine  5/13 - last HD yesterday 5/15- last HD yesterday=- they were not able to finish due to equipment issues- will need to monitor fluid intake today- so can make it til tomorrow- they are not doing more today.  9. Anemia of chronic disease: Monitor H/H, aranesp  5/9- Hb 7.6 in last 24 hours- done yesterday- feels better- no dizziness or fatigue- so doesn't feel transfusion is appropriate 5/12 HGB up to 8.1 , improved continue to monitor.  He is on Aranesp  per nephrology 5/15- pt needs to have CBC rechecked in HD tomorrow-  -06/29/23 Hgb 7.8, stable. Monitor.  5/20- Hb 8.0 5/21- refused labs this AM since getting in HD- overall labs looking better 2 days ago 5/22- Hb 9.5 this AM!! 07/06/23 Hgb 9.1 yesterday; monitor 10. H/o recurrent GIB/J[employer]ovah[employer] Witness- will only take blood products  in absolute emergency per Renal note: Continue Protonix  BID 11. H/o Hypotension: Has had drop  in BP with HD. Now cervical SCI.  --Monitor for symptoms day after HD and with activity. -4/18 baseline blood pressure stable, monitor with activity 4/19- BP actually slightly elevated today- 140s-150's- con't regimen, since can drop with HD and therapy 4/20- BP actually doing better today- in 100s'110s- however HR elevated, likely to fluid overload 4/21 nephrology started midodrine  10mg  before  dialysis, BP appears to be doing a little better today 5/8- BP on low side in last 24 hours- likely made worse due to HD yesterday- denies OH Sx's. Con't to monitor trend 5/9- BP is running in 90's systolic- doesn't feel dizzy or lightheaded sitting, but could drop when standing-will d/w therapy 5/22- 5/25- no Drops in BP-con't regimen Vitals:   07/05/23 1430 07/05/23 1500 07/05/23 1530 07/05/23 1600  BP: 101/70 102/87 99/69 130/82   07/05/23 1630 07/05/23 1707 07/05/23 1708 07/05/23 1856  BP: 122/82 131/78 121/75 121/74   07/06/23 0353 07/06/23 1431 07/06/23 2011 07/07/23 0519  BP: 101/65 116/80 115/71 114/70     12. Constipation: Small BM on 04/14 --had of nulytey yesterday and another today? -Xray 4/17 without excessive stool, patient reports stool still little hard yesterday.  Will increase MiraLAX  to 3 times a day, continue Senokot twice a day. Suppository PRN   06/09/23 LBM yesterday, cont regimen 4/28- LBM overnight- has been refusing miralax - made it prn- taking metamucil  5/18- LBM this morning per pt  5/20- LBM yesterday 5/23- LBM 5/20- needs to have a BM- since having HD today won't give Sorbitol , but will need Saturday if no BM -07/07/23 LBM yesterday per pt but last documented 5/23, monitor 13. ?left knee effusion. Not currently bothersome. Consider imaging.  14.  SIRS- Fever/hypotension/tachycardia - 4/21 Currently afebrile, he got to 50 cc IVF yesterday, CXR negative, EKG sinus tachycardia. Patient was started on Maxipime  and vancomycin  yesterday.  Appears to be doing much better today.  WBC not elevated at 7.1.  Continue to follow blood cultures 4/22- Blood Cx's (-) so far- WBC 7.3- will con't IV ABX until know final results of blood Cx's- CXR (-)- but temp was 103.2 yesterday overnight.  4/23- Blood C'x (-) x2 days so far- T 99.9 this Am after a few sips of coffee- was 100.4 this AM- will con't IV ABX since concerned about possible bacteremia- esp since having low  grade temps still 4/24- No more fevers, even low grade- no growth on blood Cx' bu it says that didn't get enough blood, so much be falsely negative 4/25- No growth for 5 days, but it also said not enough blood to be sure was a true negative. Will change stop date til tomorrow -06/08/23 at 3am temp recorded as 100.3 but 5am was 98.3; will get CBC w/diff, monitor temps closely today, if continued low grade temps, will reach out to ID. BCx NGTD x5d. Didn't get vanc yesterday? Or cefepime ? Unclear why. Cefepime  started this morning at 0011. -06/09/23 temp 100.3 at 3am yesterday and then 100 later in the day, but no fevers since, no tachycardia.  -WBC yesterday 8.9.  -Now with some confusion; spoke with Dr. Levern Reader of ID, stated cefepime  could be the cause of the confusion -recommended we stop all abx (which ended yesterday anyway) -watch the patient OFF abx -if fevers >100.3 occur then she recommended repeat BCx and CXR and could call her back to see if any abx would be advised at  that time.  -Head CT and dopplers ordered as well, as above 4/28- CT looks good- no fever, WBC is OK- off ABX 4/29- Also Dopplers (-) feeling good this AM 06/16/23 seems to have resolved. 15. Tachycardia- resolved 4/22- doing better- right at 100 this AM- will monitor- likely can be from dregs of infection vs OH. -will monitor  4/23- Tachycardia improving- 4/24-27 HR in 90's right now- doing better  4/29- 4/30- HR running in 90s-low 100's today 5/12 heart rate in the 70s and 80s primarily, continue to monitor 5/20- HR back into low 100's- but is feeling good- denies Sx's.  5/22- back to running 80s-high 90's- will monitor trend    07/07/2023    5:19 AM 07/06/2023    8:11 PM 07/06/2023    2:31 PM  Vitals with BMI  Systolic 114 115 865  Diastolic 70 71 80  Pulse 80 84 75    16. R hand swelling  4/23- doing ice- will do xrays 4/24- Xray doesn't look like cause of swelling, however could be due to new shunt? 4/30-  resolved 17. Hypocalcemia 4/24- his Albumin  is 2.1, so probably ok, and phos coming down- will leave to renal- thank you for their help  5/1- Ca 9.3 5/8- Ca 8.8 5/15- Ca 8.5 18. Hyperkalemia 4/28- K+ 6.3 today- got hypoosmotic bath to reduce K+ per note from RN/HD nurse? Also has Na of 127- being addressed by HD/renal- Phos also 5.8- but again, getting HD today 5/1- K+ down to 5.6 and Na 129 5/6- Na 133 and K+ down to 4.7 from 5.6 5/7- refused labs this AM- wants in HD 5/9- K_ came back as 4.5 5/12 potassium elevated today, chronic issue has dialysis today.  Treatment per renal team 5/14- K+ down to 5.7- from 6.2- has HD today 06/29/23 down to 5.3 yesterday, had dialysis yesterday.  5/19- HD today 5/20- K+ stayed at 5.3- doing better 5/24- K+ 4.5 yesterday 19. R>L knee pain 4/29- will ask Ortho if they will tap his R knee- I have no problem doing injection of knee, however with so much fluid, I would rather Ortho tap him and then do steroid injection.   4/30- we added Prednisone  40 mg daily x 3 days- to see if helps knee pain  5/1- mild reduction in pain so far 5/6- Pt has short term improvement from Prednisone  dosing for knees- con't percocet prn 5/7- since pt was doing so much worse with therapy, asked Ortho to come and tap knee and take off fluid- do steroid injection- they drew off 100CC- 5 different draws required- fluid is pending, but has a lot of WBCs- no organisms on gram stain.   5/8- no growth for 24 hours- R knee pain so much better  -06/22/23 NGTD x3 days, monitor  5/14- No growth  - pain so much better after 100cc fluid removed and got injection 20.  R hip pain 5/16- will get R hip xray- also c/w trochanteric bursitis on exam- if doesn't improve, will do trochanteric bursa injection Tuesday -06/29/23 R hip xray with cystic changes of femoral head/neck, also degeneration on my review- will need ortho f/up but doubt need for emergent consultation. Dr. Christiane Cowing is his primary ortho.   5/20- Dr Christiane Cowing wrote that he has severe arthritis, but needs to be out of hospital to get hip replacement.       LOS: 38 days A FACE TO FACE EVALUATION WAS PERFORMED  [address] Nolen Lindamood 07/07/2023, 11:24 AM

## 2023-07-07 NOTE — Progress Notes (Signed)
 [facility] Progress Note   Subjective:   Last HD on 5/23 with 3.4 kg UF.  He would like to try and pull specifically 4 kg tomorrow and I stated we could try this and back down if needed.  Watching a church service  Review of systems:    Denies shortness of breath or chest pain  Denies n/v     Objective Vitals:   07/06/23 0353 07/06/23 1431 07/06/23 2011 07/07/23 0519  BP: 101/65 116/80 115/71 114/70  Pulse: 82 75 84 80  Resp: 18 18 18 18   Temp: 97.8 F (36.6 C) 98.7 F (37.1 C) 98 F (36.7 C) 97.7 F (36.5 C)  TempSrc: Oral     SpO2: 100% 100% 100% 100%  Weight:      Height:       Physical Exam    General adult male in bed in no acute distress HEENT normocephalic atraumatic extraocular movements intact sclera anicteric Neck C-spine brace on; trachea midline Lungs clear to auscultation bilaterally normal work of breathing at rest on room air Heart S1S2 no rub Abdomen soft nontender nondistended Extremities no edema Psych normal mood and affect Neuro - alert and oriented x 3 provides hx and follows commands  Access:  RUE AVF with bruit and thrill   Additional Objective Labs: Basic Metabolic Panel: Recent Labs  Lab 07/01/23 1537 07/04/23 0719 07/05/23 1332  NA 132* 133* 133*  K 5.3* 4.1 4.5  CL 94* 93* 95*  CO2 22 25 25   GLUCOSE 90 86 114*  BUN 78* 33* 58*  CREATININE 9.69* 5.91* 7.98*  CALCIUM  7.6* 8.6* 7.9*  PHOS 4.8* 3.9 5.0*   Liver Function Tests: Recent Labs  Lab 07/01/23 1537 07/04/23 0719 07/05/23 1332  ALBUMIN  2.5* 2.8* 2.6*   No results for input(s): "LIPASE", "AMYLASE" in the last 168 hours. CBC: Recent Labs  Lab 07/01/23 1537 07/04/23 0719 07/05/23 2107  WBC 5.8 3.6* 4.8  NEUTROABS 3.5  --   --   HGB 8.0* 9.5* 9.1*  HCT 25.0* 30.7* 29.3*  MCV 89.3 89.5 88.3  PLT 260 262 237   Blood Culture    Component Value Date/Time   SDES FLUID 06/18/2023 1357   SPECREQUEST NONE 06/18/2023 1357   CULT  06/18/2023 1357     NO GROWTH 3 DAYS Performed at [facility], [address], [place], [place] [address]    REPTSTATUS 06/22/2023 FINAL 06/18/2023 1357    Cardiac Enzymes: No results for input(s): "CKTOTAL", "CKMB", "CKMBINDEX", "TROPONINI" in the last 168 hours. CBG: Recent Labs  Lab 07/03/23 1850  GLUCAP 147*   Iron Studies: No results for input(s): "IRON", "TIBC", "TRANSFERRIN", "FERRITIN" in the last 72 hours. @lablastinr3 @ Studies/Results: No results found. Medications:    (feeding supplement) PROSource Plus  30 mL Oral BID BM   atorvastatin   10 mg Oral Q Thu   calcitRIOL   1.5 mcg Oral Q M,W,F-HD   carbamide peroxide  5 drop Right EAR BID   Chlorhexidine  Gluconate Cloth  6 each Topical Q0600   cinacalcet   120 mg Oral Q supper   [START ON 07/09/2023] darbepoetin (ARANESP ) injection - DIALYSIS  200 mcg Subcutaneous Q Tue-1800   gabapentin   300 mg Oral QHS   Gerhardt's butt cream   Topical BID   pantoprazole   40 mg Oral BID AC   psyllium  1 packet Oral TID   senna-docusate  2 tablet Oral BID   sevelamer  carbonate  3,200 mg Oral TID WC  sodium zirconium cyclosilicate   10 g Oral Daily   topiramate   25 mg Oral BID    Dialysis Orders: OP Dialysis Orders [facility] - MWF 4h  B400   85.5kg   2K bath  AVF   - Heparin  3000 units   - Mircera 60mcg IV q 2 weeks- last dose 05/24/23  - Calcitriol  1.5mcg PO q HD  Assessment/Plan: 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  Renal panel and CBC in AM.  Defer labs over weekend. Hyperkalemia:Chronic issue.  On Lokelma  daily now. Continue to reinforce renal diet. HTN/volume:optimize volume status with HD. Controlled.   Anemia of ESRD:  have ordered to further increase to aranesp  200 mcg weekly on Tuesdays for next dose on 5/27 - may need to back down to 150 mcg again depending on trends.  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 Resumed calcitriol    Nutrition: continue protein supplements. Patient reports appetite is better  Dispo - per rehab. Pt states next week he will be discharged from rehab he thinks   [name], MD 07/07/2023 1:46 PM

## 2023-07-07 NOTE — Progress Notes (Signed)
 Occupational Therapy Discharge Summary  Patient Details  Name: Carl Regas Sr. MRN: [id number] Date of Birth: December 24, 1960  Date of Discharge from OT service:July 14, 2023   Patient has met 10 of 10 long term goals due to improved activity tolerance, improved balance, postural control, ability to compensate for deficits, and functional use of  RIGHT upper, RIGHT lower, LEFT upper, and LEFT lower extremity.  Patient to discharge at overall Supervision-Min A level.  Patient's care partner is independent to provide the necessary physical assistance at discharge.  Patient will be transferring to SNF until 1st floor apartment available d/t inaccessible home environment of 18 [address].  Reasons goals not met: All goals met  Recommendation:  Patient will benefit from ongoing skilled OT services in skilled nursing facility setting to continue to advance functional skills in the area of BADL, iADL, Vocation, and Reduce care partner burden.  Equipment: No equipment provided  Reasons for discharge: treatment goals met and discharge from hospital  Patient/family agrees with progress made and goals achieved: Yes  OT Discharge Precautions/Restrictions  Precautions: Fall, Cervical, Other (comment) Recall of Precautions/Restrictions: Impaired Precaution/Restrictions Comments: Verbally reviewed cervical precautions. Required Braces or Orthoses: Cervical Brace Cervical Brace: Soft collar, At all times Restrictions Weight Bearing Restrictions Per Provider Order: No ADL ADL Eating: Modified independent Where Assessed-Eating: Wheelchair Grooming: Modified independent Where Assessed-Grooming: Wheelchair Upper Body Bathing: Supervision/safety Where Assessed-Upper Body Bathing: Shower Lower Body Bathing: Supervision/safety Where Assessed-Lower Body Bathing: Shower Upper Body Dressing: Modified independent (Device) Where Assessed-Upper Body Dressing: Edge of bed Lower Body Dressing:  Supervision/safety Where Assessed-Lower Body Dressing: Edge of bed Toileting: Supervision/safety Where Assessed-Toileting: Bedside Commode, Actuary Transfer: Close supervision Toilet Transfer Method: Proofreader: Gaffer: Close supervison Web designer Method: Event organiser: Close supervision Film/video editor Method: Designer, industrial/product: Grab bars, Emergency planning/management officer Vision Baseline Vision/History: 1 Wears glasses Wears Glasses: Reading only Patient Visual Report: No change from baseline Perception  Perception: Within Functional Limits Praxis Praxis: WFL Cognition Cognition Overall Cognitive Status: Within Functional Limits for tasks assessed Arousal/Alertness: Awake/alert Orientation Level: Person;Place;Situation Person: Oriented Place: Oriented Situation: Oriented Memory: Appears intact Awareness: Appears intact Problem Solving: Appears intact Safety/Judgment: Appears intact Brief Interview for Mental Status (BIMS) Repetition of Three Words (First Attempt): 3 Temporal Orientation: Year: Correct Temporal Orientation: Month: Accurate within 5 days Temporal Orientation: Day: Correct Recall: "Sock": Yes, no cue required Recall: "Blue": Yes, no cue required Recall: "Bed": Yes, no cue required BIMS Summary Score: 15 Sensation Sensation Light Touch: Impaired Detail Peripheral sensation comments: Impaired BLE/BUE, distal>proximal, R>L Light Touch Impaired Details: Impaired RUE;Impaired LUE;Impaired RLE;Impaired LLE Hot/Cold: Appears Intact Proprioception: Appears Intact Stereognosis: Not tested Coordination Gross Motor Movements are Fluid and Coordinated: No Fine Motor Movements are Fluid and Coordinated: No Coordination and Movement Description: Deficits in coordination/strength consistent with new SCI. Motor  Motor Motor: Tetraplegia;Abnormal postural  alignment and control;Abnormal tone Motor - Discharge Observations: significant improvements in overall strength in BUE/BLE and trunk since admission, still with limitations in RUE from 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 Mobility  Bed Mobility Bed Mobility: Supine to Sit;Sit to Supine Supine to Sit: Independent with assistive device Sit to Supine: Independent with assistive device Transfers Sit to Stand: Supervision/Verbal cueing (from heightened surface) Stand to Sit: Supervision/Verbal cueing  Trunk/Postural Assessment  Cervical Assessment Cervical Assessment: Exceptions to [facility] (forward head) Thoracic Assessment Thoracic Assessment: Exceptions to [facility] (rounded shoulders) Lumbar Assessment Lumbar Assessment: Exceptions to [facility] (posterior pelvic tilt)  Postural Control Postural Control: Deficits on evaluation Trunk Control: good Righting Reactions: Delayed Postural Limitations: slight forward flexion in standing when fatigued  Balance Balance Balance Assessed: Yes Static Sitting Balance Static Sitting - Balance Support: Feet supported Static Sitting - Level of Assistance: 6: Modified independent (Device/Increase time) Dynamic Sitting Balance Dynamic Sitting - Balance Support: During functional activity Dynamic Sitting - Level of Assistance: 6: Modified independent (Device/Increase time) Dynamic Sitting - Balance Activities: Lateral lean/weight shifting;Forward lean/weight shifting;Reaching for objects Static Standing Balance Static Standing - Balance Support: During functional activity;Bilateral upper extremity supported Static Standing - Level of Assistance: 5: Stand by assistance (with RW) Dynamic Standing Balance Dynamic Standing - Balance Support: During functional activity Dynamic Standing - Level of Assistance: 5: Stand by assistance (with RW) Extremity/Trunk Assessment RUE Assessment RUE Assessment: Exceptions to [facility] Active Range of Motion (AROM) Comments: History of  shoulder surgery (Limited ROM PTA); , WFL for elbow flexion/extension; functional grasp for feeding/manipulation General Strength Comments: overall 3+/5 LUE Assessment LUE Assessment: Exceptions to [facility] Active Range of Motion (AROM) Comments: ~90 degrees of shoulder abduction; WFL for elbow flexio/extension; [facility] for digital flexion/extension, weak grasp, dysmetria General Strength Comments: overall 3+/5   [name], OTD, OTR/L 07/14/2023, 4:26 PM

## 2023-07-07 NOTE — Progress Notes (Signed)
 Occupational Therapy Session Note  Patient Details  Name: Carl Grima Sr. MRN: [id number] Date of Birth: [birth year]  Today's Date: 07/07/2023 OT Individual Time: 1022-1105 OT Individual Time Calculation (min): 43 min    Short Term Goals: Week 5:  OT Short Term Goal 1 (Week 5): Pt will complete toileting at Min A with AE as necessary OT Short Term Goal 2 (Week 5): Pt will complete bathing at [facility] with AE as necessary  Skilled Therapeutic Interventions/Progress Updates:      Therapy Documentation Precautions:  Precautions Precautions: Fall, Cervical, Other (comment) (HD MWF) Recall of Precautions/Restrictions: Impaired Precaution/Restrictions Comments: Verbally reviewed cervical precautions. Required Braces or Orthoses: Cervical Brace Cervical Brace: Soft collar, At all times Restrictions Weight Bearing Restrictions Per Provider Order: No General: "Hey there Abraham Hoffmann" Pt seated in W/C upon OT arrival, agreeable to OT.  Pain: no pain reported  Other Treatments: OT providing skilled intervention on community mobility/integration with W/C in order to prepare for community integration at D/C. Pt in PW/C requiring no rest breaks. Pt practices maneuvering into elevators, uneven surfaces, pulling into table tops, maneuvering around obstacles and propelling at advanced distances. Pt practicing maneuvering within public spaces ([employer]), able to park W/C at counter, reach out of BOS to give/retrieve cash for coffee and managing items in hands while completing W/C mobility.    Pt seated in W/C at end of session with W/C alarm donned, call light within reach and 4Ps assessed.    Therapy/Group: Individual Therapy  [name], OTD, OTR/L 07/07/2023, 12:46 PM

## 2023-07-08 LAB — RENAL FUNCTION PANEL
Albumin: 2.9 g/dL — ABNORMAL LOW (ref 3.5–5.0)
Anion gap: 19 — ABNORMAL HIGH (ref 5–15)
BUN: 75 mg/dL — ABNORMAL HIGH (ref 8–23)
CO2: 21 mmol/L — ABNORMAL LOW (ref 22–32)
Calcium: 8.2 mg/dL — ABNORMAL LOW (ref 8.9–10.3)
Chloride: 91 mmol/L — ABNORMAL LOW (ref 98–111)
Creatinine, Ser: 9.65 mg/dL — ABNORMAL HIGH (ref 0.61–1.24)
GFR, Estimated: 6 mL/min — ABNORMAL LOW (ref >60)
Glucose, Bld: 110 mg/dL — ABNORMAL HIGH (ref 70–99)
Phosphorus: 4.9 mg/dL — ABNORMAL HIGH (ref 2.5–4.6)
Potassium: 5.3 mmol/L — ABNORMAL HIGH (ref 3.5–5.1)
Sodium: 131 mmol/L — ABNORMAL LOW (ref 135–145)

## 2023-07-08 LAB — CBC
HCT: 29.8 % — ABNORMAL LOW (ref 39.0–52.0)
Hemoglobin: 9.4 g/dL — ABNORMAL LOW (ref 13.0–17.0)
MCH: 28.1 pg (ref 26.0–34.0)
MCHC: 31.5 g/dL (ref 30.0–36.0)
MCV: 89 fL (ref 80.0–100.0)
Platelets: 270 10*3/uL (ref 150–400)
RBC: 3.35 MIL/uL — ABNORMAL LOW (ref 4.22–5.81)
RDW: 16.7 % — ABNORMAL HIGH (ref 11.5–15.5)
WBC: 5.5 10*3/uL (ref 4.0–10.5)
nRBC: 0 % (ref 0.0–0.2)

## 2023-07-08 MED ORDER — ALTEPLASE 2 MG IJ SOLR: 2.0000 mg | Freq: Once | INTRAMUSCULAR | Status: DC | PRN

## 2023-07-08 MED ORDER — LIDOCAINE HCL (PF) 1 % IJ SOLN: 5.0000 mL | INTRAMUSCULAR | Status: DC | PRN

## 2023-07-08 MED ORDER — PENTAFLUOROPROP-TETRAFLUOROETH EX AERO: 1.0000 | INHALATION_SPRAY | CUTANEOUS | Status: DC | PRN

## 2023-07-08 MED ORDER — ANTICOAGULANT SODIUM CITRATE 4% (200MG/5ML) IV SOLN: 5.0000 mL | Status: DC | PRN

## 2023-07-08 MED ORDER — HEPARIN SODIUM (PORCINE) 1000 UNIT/ML DIALYSIS: 1000.0000 [IU] | INTRAMUSCULAR | Status: DC | PRN

## 2023-07-08 NOTE — Progress Notes (Signed)
 PROGRESS NOTE   Subjective/Complaints:  Pt reports HD today- getting into shower with OT- able to stand to get into shower- using RW to get into bathroom.   Pain still in R hip- but chronic- exacerbated by therapy, but knows cannot get hip replacement until out of hospital.     ROS:    Pt denies SOB, abd pain, CP, N/V/C/D, and vision changes   B/L R>L knee pain (+)- R knee very improved +R hip pain-managed but severe arthritis  Objective:   No results found.      Recent Labs    07/05/23 2107  WBC 4.8  HGB 9.1*  HCT 29.3*  PLT 237     Recent Labs    07/05/23 1332  NA 133*  K 4.5  CL 95*  CO2 25  GLUCOSE 114*  BUN 58*  CREATININE 7.98*  CALCIUM  7.9*     Intake/Output Summary (Last 24 hours) at 07/08/2023 1024 Last data filed at 07/08/2023 0758 Gross per 24 hour  Intake 478 ml  Output --  Net 478 ml        Physical Exam: Vital Signs Blood pressure 121/69, pulse 73, temperature 97.7 F (36.5 C), resp. rate 18, height 6\' 1"  (1.854 m), weight 94 kg, SpO2 100%.   General: awake, alert, appropriate, standing with Assistance in shower- got there with RW and gait belt;  NAD HENT: conjugate gaze; oropharynx moist CV: regular rate; no JVD Pulmonary: CTA B/L; no W/R/R- good air movement GI: soft, NT, ND, (+)BS- hypoactive Psychiatric: appropriate- bright affect Neurological: Ox3 NO LE edema B/L  PRIOR EXAMS:MSK- R lateral and groin TTP- c/w trochanteric bursitis and R joint pain  Neurological: Ox3, follows commands, cranial nerves II through XII grossly intact,   MSK- R knee much less swollen MsK: B/L knees with effusions- less TTP on R knee this AM- looks good- mild effusion , no erythema, not really focally warm to touch- Both have effusions, - R hand swelling looks good/resolved  MSK- R hand swelling esp ulnar side of hand/dorsum and L wrist-stable this AM RUE- Biceps 4/5; WE 4-/5;  Triceps 2/5; Grip 2-/5 and FA 1/5 LUE- 4/5 except triceps 3+ to 4-/5 RLE- HF 1 to 2-/5; KE 2-/5; DF/PF 2-/5 LLE- HF 3/5; KE 4-/5; DF/PF 4/5    Skin: C/D/I. Multiple lesions on BL legs with various stages of healing.  + RUE fistula - intact   MSK:      LLE knee with palpable effusion, no warmth, no ttp Mood: Pleasant affect, appropriate mood.  Neurologic exam: Alert and oriented x 4, fluent, follows commands, cranial nerves II through XII grossly intact, no hypertonia noted  Sensation: Reduced to light touch in distal right lower extremity to the mid-thigh       Strength:                RUE: 2/5 SA, 2/5 EF, 2/5 EE, 2/5 WE, 2/5 FF, 2/5 FA                LUE:  3/5 SA, 3/5 EF, 3/5 EE, 3/5 WE, 3/5 FF, 3/5 FA  RLE: 2/5 HF, 2/5 KE, 2/5  DF, 2/5  EHL, 2/5  PF                 LLE:  3/5 HF, 4/5 KE, 4/5  DF, 4/5  EHL, 4/5  PF  Neuroexam stable 4/21  Assessment/Plan: 1. Functional deficits which require 3+ hours per day of interdisciplinary therapy in a comprehensive inpatient rehab setting. Physiatrist is providing close team supervision and 24 hour management of active medical problems listed below. Physiatrist and rehab team continue to assess barriers to discharge/monitor patient progress toward functional and medical goals  Care Tool:  Bathing    Body parts bathed by patient: Right arm, Left arm, Chest, Abdomen, Front perineal area, Buttocks, Face, Left lower leg, Right lower leg, Left upper leg, Right upper leg   Body parts bathed by helper: Left arm, Front perineal area, Buttocks, Right lower leg, Left lower leg     Bathing assist Assist Level: Supervision/Verbal cueing     Upper Body Dressing/Undressing Upper body dressing   What is the patient wearing?: Pull over shirt    Upper body assist Assist Level: Independent with assistive device    Lower Body Dressing/Undressing Lower body dressing      What is the patient wearing?: Pants, Underwear/pull up      Lower body assist Assist for lower body dressing: Supervision/Verbal cueing     Toileting Toileting Toileting Activity did not occur (Clothing management and hygiene only): N/A (no void or bm) (anuric, HD patient)  Toileting assist Assist for toileting: Supervision/Verbal cueing     Transfers Chair/bed transfer  Transfers assist     Chair/bed transfer assist level: Contact Guard/Touching assist     Locomotion Ambulation   Ambulation assist   Ambulation activity did not occur: Safety/medical concerns  Assist level: Contact Guard/Touching assist Assistive device: Walker-rolling Max distance: 300'   Walk 10 feet activity   Assist  Walk 10 feet activity did not occur: Safety/medical concerns  Assist level: Contact Guard/Touching assist Assistive device: Walker-rolling   Walk 50 feet activity   Assist Walk 50 feet with 2 turns activity did not occur: Safety/medical concerns  Assist level: Contact Guard/Touching assist Assistive device: Walker-rolling    Walk 150 feet activity   Assist Walk 150 feet activity did not occur: Safety/medical concerns  Assist level: Contact Guard/Touching assist Assistive device: Walker-rolling    Walk 10 feet on uneven surface  activity   Assist Walk 10 feet on uneven surfaces activity did not occur: Safety/medical concerns   Assist level: Contact Guard/Touching assist Assistive device: Walker-rolling   Wheelchair     Assist Is the patient using a wheelchair?: Yes Type of Wheelchair: Power    Wheelchair assist level: Independent Max wheelchair distance: >500    Wheelchair 50 feet with 2 turns activity    Assist        Assist Level: Independent   Wheelchair 150 feet activity     Assist      Assist Level: Independent   Blood pressure 121/69, pulse 73, temperature 97.7 F (36.5 C), resp. rate 18, height 6\' 1"  (1.854 m), weight 94 kg, SpO2 100%.  Medical Problem List and Plan: 1.  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  Con't CIR PT and OT- plan for SNF placement tomorrow- R hip pain still interferes D/c to SNF 5/27- will con't to work on progressing gait distance and less A- also working on stairs, but needs 3 flights of stairs to get to home, is moving 7/1 to 1st floor apt.  2.  Antithrombotics: -DVT/anticoagulation:  Mechanical: Sequential compression devices, below knee Bilateral lower extremities             -antiplatelet therapy: N/A--no antiplatelet due to hx of recurrent GIB  4/25- d/w family- they agree that Lovenox  not appropriate at this time.   -06/09/23 ordered dopplers to r/o clots given low grade temps recently  4/28- LE Dopplers (-) for DVT 3. Pain Management:  oxycodone  prn. Gabapentin  300 mg/hs.              --has Flexeril  10 mg TID prn              - patient with intermittent, severe b/l headache - add topomax 25 mg BID    4/19- pt rating pain 07/19/08 which is "tolerable" 5/7- got Ortho to come and take fluid off R knee- got 100cc off- pain is no more than 1/10 this AM! 5/8-5/9 knee still doing well 5/13-5/14-  pain controlled- allowed him to walk further yesterday in therapy- still cannot do more than 1 step 5/16- will get R hip xray due to clunk and R hi pain -06/29/23 R hip xray with cystic changes of femoral head/neck, also degeneration on my review- will need ortho f/up but doubt need for emergent consultation. Dr. Christiane Cowing is his primary ortho 5/20- Pt's Ortho responded and knows about Deg arthritis in R hip- but cannot address while in hospital  5/26- no change on R hip pain 4.  Mood/Behavior/Sleep: LCSW to follow for evaluation and support             -antipsychotic agents: N/A             --melatonin prn for insomnia.  -06/09/23 poor sleep, d/t interruptions, asked nursing to minimize interruptions between 11p-5a-- sleeping better lately 5. Neuropsych/cognition: This patient is capable of making decisions on his own behalf. 5/8- Ox3 and appropriate 6. Skin/Wound Care: Routine pressure relief measures.   4/19- due to ulcers on legs, on low air loss mattress 7. Fluids/Electrolytes/Nutrition: Strict I/O. Daily weights. Labs with HD. 8. ESRD- HD MWF: Schedule HD at the end of the day to help with tolerance of therapy             --Sensipar  and Renvela  for metabolic bone disease.   - Patient reports no longer makes urine  5/13 - last HD yesterday 5/15- last HD yesterday=- they were not able to finish due to equipment issues- will need to monitor fluid intake today- so can make it til tomorrow- they are not doing more today.  9. Anemia of chronic disease: Monitor H/H, aranesp  5/9- Hb 7.6 in last 24 hours- done yesterday- feels better- no dizziness or fatigue- so doesn't feel transfusion is appropriate 5/12 HGB up to 8.1 , improved continue to monitor.  He is on Aranesp  per nephrology 5/15- pt needs to have CBC rechecked in HD tomorrow-  -06/29/23 Hgb 7.8, stable. Monitor.  5/20- Hb 8.0 5/21- refused  labs this AM since getting in HD- overall labs looking better 2 days ago 5/22- Hb 9.5 this AM!! 07/06/23 Hgb 9.1 yesterday; monitor 5/26- HD today- will get labs at that point 10. H/o recurrent GIB/Jehovah's Witness- will only take blood products in absolute emergency per Renal note: Continue Protonix  BID 11. H/o Hypotension: Has had drop  in BP with HD. Now cervical SCI.  --Monitor for symptoms day after HD and with activity. -4/18 baseline blood pressure stable, monitor with activity 4/19- BP actually slightly elevated today- 140s-150's- con't regimen, since can drop with  HD and therapy 4/20- BP actually doing better today- in 100s'110s- however HR elevated, likely to fluid overload 4/21 nephrology started midodrine  10mg  before dialysis, BP appears to be doing a little better today 5/8- BP on low side in last 24 hours- likely made worse due to HD yesterday- denies OH Sx's. Con't to monitor trend 5/9- BP is running in 90's systolic- doesn't feel dizzy or lightheaded sitting, but could drop when standing-will d/w therapy 5/22- 5/25- 5/26- Drop in BP yesterday with standing to 90's systolic- felt dizzy- will on Midodrine - will wait to increase Vitals:   07/05/23 1600 07/05/23 1630 07/05/23 1707 07/05/23 1708  BP: 130/82 122/82 131/78 121/75   07/05/23 1856 07/06/23 0353 07/06/23 1431 07/06/23 2011  BP: 121/74 101/65 116/80 115/71   07/07/23 0519 07/07/23 1608 07/07/23 2011 07/08/23 0548  BP: 114/70 (!) 99/52 111/68 121/69     12. Constipation: Small BM on 04/14 --had of nulytey yesterday and another today? -Xray 4/17 without excessive stool, patient reports stool still little hard yesterday.  Will increase MiraLAX  to 3 times a day, continue Senokot twice a day. Suppository PRN   06/09/23 LBM yesterday, cont regimen 4/28- LBM overnight- has been refusing miralax - made it prn- taking metamucil  5/18- LBM this morning per pt  5/20- LBM yesterday 5/23- LBM 5/20- needs to have a BM- since having HD today won't give Sorbitol , but will need Saturday if no BM -07/07/23 LBM yesterday per pt but last documented 5/23, monitor  13. ?left knee effusion. Not currently bothersome. Consider imaging.  14.  SIRS- Fever/hypotension/tachycardia - 4/21 Currently afebrile, he got to 50 cc IVF yesterday, CXR negative, EKG sinus tachycardia. Patient was started on Maxipime  and vancomycin  yesterday.  Appears to be doing much better today.  WBC not elevated at 7.1.  Continue to follow blood cultures 4/22- Blood Cx's (-) so far- WBC 7.3- will con't IV ABX until know final results  of blood Cx's- CXR (-)- but temp was 103.2 yesterday overnight.  4/23- Blood C'x (-) x2 days so far- T 99.9 this Am after a few sips of coffee- was 100.4 this AM- will con't IV ABX since concerned about possible bacteremia- esp since having low grade temps still 4/24- No more fevers, even low grade- no growth on blood Cx' bu it says that didn't get enough blood, so much be falsely negative 4/25- No growth for 5 days, but it also said not enough blood to be sure was a true negative. Will change stop date til tomorrow -06/08/23 at 3am temp recorded as 100.3 but 5am was 98.3; will get CBC w/diff, monitor temps closely today, if continued low grade temps, will reach out to ID. BCx NGTD x5d. Didn't get vanc yesterday? Or cefepime ? Unclear why. Cefepime  started this morning at 0011. -06/09/23 temp 100.3 at 3am yesterday and then 100 later in the day, but no fevers since, no tachycardia.  -WBC yesterday 8.9.  -  Now with some confusion; spoke with Dr. Levern Reader of ID, stated cefepime  could be the cause of the confusion -recommended we stop all abx (which ended yesterday anyway) -watch the patient OFF abx -if fevers >100.3 occur then she recommended repeat BCx and CXR and could call her back to see if any abx would be advised at that time.  -Head CT and dopplers ordered as well, as above 4/28- CT looks good- no fever, WBC is OK- off ABX 4/29- Also Dopplers (-) feeling good this AM 06/16/23 seems to have resolved. 15. Tachycardia- resolved 4/22- doing better- right at 100 this AM- will monitor- likely can be from dregs of infection vs OH. -will monitor  4/23- Tachycardia improving- 4/24-27 HR in 90's right now- doing better  4/29- 4/30- HR running in 90s-low 100's today 5/12 heart rate in the 70s and 80s primarily, continue to monitor 5/20- HR back into low 100's- but is feeling good- denies Sx's.  5/22- back to running 80s-high 90's- will monitor trend    07/08/2023    5:48 AM 07/08/2023    5:00 AM 07/07/2023     8:11 PM  Vitals with BMI  Weight  207 lbs 4 oz   Systolic 121  111  Diastolic 69  68  Pulse 73  86    16. R hand swelling  4/23- doing ice- will do xrays 4/24- Xray doesn't look like cause of swelling, however could be due to new shunt? 4/30- resolved 17. Hypocalcemia 4/24- his Albumin  is 2.1, so probably ok, and phos coming down- will leave to renal- thank you for their help  5/1- Ca 9.3 5/8- Ca 8.8 5/15- Ca 8.5 18. Hyperkalemia 4/28- K+ 6.3 today- got hypoosmotic bath to reduce K+ per note from RN/HD nurse? Also has Na of 127- being addressed by HD/renal- Phos also 5.8- but again, getting HD today 5/1- K+ down to 5.6 and Na 129 5/6- Na 133 and K+ down to 4.7 from 5.6 5/7- refused labs this AM- wants in HD 5/9- K_ came back as 4.5 5/12 potassium elevated today, chronic issue has dialysis today.  Treatment per renal team 5/14- K+ down to 5.7- from 6.2- has HD today 06/29/23 down to 5.3 yesterday, had dialysis yesterday.  5/19- HD today 5/20- K+ stayed at 5.3- doing better 5/24- K+ 4.5 yesterday- best it's been 19. R>L knee pain 4/29- will ask Ortho if they will tap his R knee- I have no problem doing injection of knee, however with so much fluid, I would rather Ortho tap him and then do steroid injection.   4/30- we added Prednisone  40 mg daily x 3 days- to see if helps knee pain  5/1- mild reduction in pain so far 5/6- Pt has short term improvement from Prednisone  dosing for knees- con't percocet prn 5/7- since pt was doing so much worse with therapy, asked Ortho to come and tap knee and take off fluid- do steroid injection- they drew off 100CC- 5 different draws required- fluid is pending, but has a lot of WBCs- no organisms on gram stain.   5/8- no growth for 24 hours- R knee pain so much better  -06/22/23 NGTD x3 days, monitor  5/14- No growth  - pain so much better after 100cc fluid removed and got injection 20.  R hip pain 5/16- will get R hip xray- also c/w  trochanteric bursitis on exam- if doesn't improve, will do trochanteric bursa injection Tuesday -06/29/23 R hip xray with cystic changes of femoral  head/neck, also degeneration on my review- will need ortho f/up but doubt need for emergent consultation. Dr. Christiane Cowing is his primary ortho.  5/20- Dr Christiane Cowing wrote that he has severe arthritis, but needs to be out of hospital to get hip replacement.   I spent a total of  35  minutes on total care today- >50% coordination of care- due to  D/w SW multiple times about P2P- also d/w pt about plan for d/c- Also spoke with therapy- will wait to d/c until we know the plan.     LOS: 39 days A FACE TO FACE EVALUATION WAS PERFORMED  Claude Swendsen 07/08/2023, 10:24 AM

## 2023-07-08 NOTE — Plan of Care (Signed)
  Problem: SCI BOWEL ELIMINATION Goal: RH STG SCI MANAGE BOWEL WITH MEDICATION WITH ASSISTANCE Description: STG SCI Manage bowel with medication with assistance. 07/08/2023 0743 by Zaakirah Kistner, RN Outcome: Progressing 07/08/2023 0740 by Joda Braatz, RN Outcome: Progressing   Problem: RH SKIN INTEGRITY Goal: RH STG SKIN FREE OF INFECTION/BREAKDOWN Description: Manage skin free of infection/breakdown with minimal assistance 07/08/2023 0743 by Cathay Clonts, RN Outcome: Progressing   Problem: RH SAFETY Goal: RH STG ADHERE TO SAFETY PRECAUTIONS W/ASSISTANCE/DEVICE Description: STG Adhere to Safety Precautions With minimal Assistance/Device. 07/08/2023 0743 by Audelia Knape, RN Outcome: Progressing 07/08/2023 0740 by Lilou Kneip, RN Outcome: Progressing   Problem: RH KNOWLEDGE DEFICIT SCI Goal: RH STG INCREASE KNOWLEDGE OF SELF CARE AFTER SCI Description: Manage increase knowledge of self care after SCI with minimal assistance using educational materials provided to son 07/08/2023 0743 by Jeremyah Jelley, RN Outcome: Progressing 07/08/2023 0740 by Monia Timmers, RN Outcome: Progressing

## 2023-07-08 NOTE — Progress Notes (Signed)
 Received patient in bed.Alert and oriented x 4.Consent verified.  Access used: Left avf that worked well.   Duration of treatment : 3 hours only.  Uf goal :3.8  liters ,he tolerated.  Hand off to the patient's nurse.Back into his room with stable condition via transporter.

## 2023-07-08 NOTE — Progress Notes (Signed)
 Patient ID: [name]., male   DOB: 1960/11/17, 63 y.o.   MRN: [id number]  According to [facility] they would like a peer to peer with MD prior to making decision regarding coverage for SNF. MD aware and will try but possibly closed today and will need to do in am at latest. Pt is aware along with Star at [facility]. [employer] is closed due to holiday today. Will update once know any new information

## 2023-07-08 NOTE — Progress Notes (Signed)
 [facility] Progress Note   Subjective:   Asks if we can pull more fluid today, denies SOB but he feels like he has extra fluid on. Denies CP, palpitations, dizziness.   Objective Vitals:   07/07/23 1608 07/07/23 2011 07/08/23 0500 07/08/23 0548  BP: (!) 99/52 111/68  121/69  Pulse: 97 86  73  Resp: 17 18  18   Temp: 98.2 F (36.8 C) 97.7 F (36.5 C)  97.7 F (36.5 C)  TempSrc: Oral     SpO2: 100% 100%  100%  Weight:   94 kg   Height:       Physical Exam General: Alert male in C collar, NAD Heart: RRR, no murmurs, rubs or gallops  Lungs:CTA bilaterally, respirations unlabored on RA Abdomen: Soft, non-distended, +BS Extremities: treace edema b/l lower extremities Dialysis Access:  RUE AVF + t/b  Additional Objective Labs: Basic Metabolic Panel: Recent Labs  Lab 07/01/23 1537 07/04/23 0719 07/05/23 1332  NA 132* 133* 133*  K 5.3* 4.1 4.5  CL 94* 93* 95*  CO2 22 25 25   GLUCOSE 90 86 114*  BUN 78* 33* 58*  CREATININE 9.69* 5.91* 7.98*  CALCIUM  7.6* 8.6* 7.9*  PHOS 4.8* 3.9 5.0*   Liver Function Tests: Recent Labs  Lab 07/01/23 1537 07/04/23 0719 07/05/23 1332  ALBUMIN  2.5* 2.8* 2.6*   No results for input(s): "LIPASE", "AMYLASE" in the last 168 hours. CBC: Recent Labs  Lab 07/01/23 1537 07/04/23 0719 07/05/23 2107  WBC 5.8 3.6* 4.8  NEUTROABS 3.5  --   --   HGB 8.0* 9.5* 9.1*  HCT 25.0* 30.7* 29.3*  MCV 89.3 89.5 88.3  PLT 260 262 237   Blood Culture    Component Value Date/Time   SDES FLUID 06/18/2023 1357   SPECREQUEST NONE 06/18/2023 1357   CULT  06/18/2023 1357    NO GROWTH 3 DAYS Performed at [facility], [address], [place], [place] [address]    REPTSTATUS 06/22/2023 FINAL 06/18/2023 1357    Cardiac Enzymes: No results for input(s): "CKTOTAL", "CKMB", "CKMBINDEX", "TROPONINI" in the last 168 hours. CBG: Recent Labs  Lab 07/03/23 1850  GLUCAP 147*   Iron Studies: No results for input(s): "IRON",  "TIBC", "TRANSFERRIN", "FERRITIN" in the last 72 hours. @lablastinr3 @ Studies/Results: No results found. Medications:  anticoagulant sodium citrate       (feeding supplement) PROSource Plus  30 mL Oral BID BM   atorvastatin   10 mg Oral Q Thu   calcitRIOL   1.5 mcg Oral Q M,W,F-HD   carbamide peroxide  5 drop Right EAR BID   Chlorhexidine  Gluconate Cloth  6 each Topical Q0600   Chlorhexidine  Gluconate Cloth  6 each Topical Q0600   cinacalcet   120 mg Oral Q supper   [START ON 07/09/2023] darbepoetin (ARANESP ) injection - DIALYSIS  200 mcg Subcutaneous Q Tue-1800   gabapentin   300 mg Oral QHS   Gerhardt's butt cream   Topical BID   pantoprazole   40 mg Oral BID AC   psyllium  1 packet Oral TID   senna-docusate  2 tablet Oral BID   sevelamer  carbonate  3,200 mg Oral TID WC   sodium zirconium cyclosilicate   10 g Oral Daily   topiramate   25 mg Oral BID    Dialysis Orders: OP Dialysis Orders [facility] - MWF 4h  B400   85.5kg   2K bath  AVF   - Heparin  3000 units   - Mircera 60mcg IV q 2 weeks- last dose 05/24/23  -  Calcitriol  1.5mcg PO q HD  Assessment/Plan: 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  Planned for HD today Hyperkalemia:Chronic issue.  On Lokelma  daily now. Continue to reinforce renal diet. HTN/volume: Tolerates high UF goals, agrees to 4L today. Well over EDW if weights are correct, will try to get standing weight when patient is able Anemia of ESRD:  Hgb 9.1. We have ordered to further increase to aranesp  200 mcg weekly on Tuesdays for next dose on 5/27. 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 Resumed calcitriol   Nutrition: continue protein supplements. Patient reports appetite is better   Dispo - per rehab. Pt states next week he will be discharged from rehab he thinks     Ramona Burner, PA-C 07/08/2023, 12:15 PM  [facility] Pager: [phone]

## 2023-07-08 NOTE — Plan of Care (Signed)
  Problem: RH SKIN INTEGRITY Goal: RH STG SKIN FREE OF INFECTION/BREAKDOWN Description: Manage skin free of infection/breakdown with minimal assistance Outcome: Progressing   Problem: RH SAFETY Goal: RH STG ADHERE TO SAFETY PRECAUTIONS W/ASSISTANCE/DEVICE Description: STG Adhere to Safety Precautions With minimal Assistance/Device. Outcome: Progressing   Problem: RH KNOWLEDGE DEFICIT SCI Goal: RH STG INCREASE KNOWLEDGE OF SELF CARE AFTER SCI Description: Manage increase knowledge of self care after SCI with minimal assistance using educational materials provided to son Outcome: Progressing   Problem: RH PAIN MANAGEMENT Goal: RH STG PAIN MANAGED AT OR BELOW PT'S PAIN GOAL Description: Pain<4 w/ prns Outcome: Progressing

## 2023-07-08 NOTE — Progress Notes (Signed)
 Occupational Therapy Session Note  Patient Details  Name: Carl Lagrange Sr. MRN: [id number] Date of Birth: 02-28-1960  Today's Date: 07/08/2023 OT Individual Time: 1100-1200 OT Individual Time Calculation (min): 60 min    Short Term Goals: Week 5:  OT Short Term Goal 1 (Week 5): Pt will complete toileting at Min A with AE as necessary OT Short Term Goal 2 (Week 5): Pt will complete bathing at [facility] with AE as necessary  Skilled Therapeutic Interventions/Progress Updates:      Therapy Documentation Precautions:  Precautions Precautions: Fall, Cervical, Other (comment) Recall of Precautions/Restrictions: Impaired Precaution/Restrictions Comments: Verbally reviewed cervical precautions. Required Braces or Orthoses: Cervical Brace Cervical Brace: Soft collar, At all times Restrictions Weight Bearing Restrictions Per Provider Order: No General: "I can get myself off of the toilet now!" Pt seated in W/C upon OT arrival, agreeable to OT.   Pain: no pain reported  ADL: OT providing skilled intervention on ADL retraining in order to increase independence with tasks and increase activity tolerance. Pt completed the following tasks at the current level of assist: Toilet transfer: Pt ambulated with RW from W/C><toilet overall with SBA and no LOB/SOB, pt able to rise into standing with assistance of grab bar Toileting: pt able to complete 3/3 simulated toileting tasks with SBA  Exercises: OT instructing pt on 9HPT in order to complete standardized test for baseline BUE function. Pt scored R-1:20 sec  L- 45 sec which is not WNL of age and sex per pt demographics, although a significant improvement from prior testing and eval.   Other Treatments: OT retesting MMT in order to note improvements of BUE strength/endurance d/t increased independence with ADLs.  RUE Assessment Active Range of Motion (AROM) Comments: History of shoulder surgery (Limited ROM PTA); ~100 degrees of elbow extension, WFL  for elbow flexion; non-functional digital extension/flexion General Strength Comments: 3-/5 LUE Assessment Active Range of Motion (AROM) Comments: ~90 degrees of shoulder abduction; WFL for elbow flexio/extension; [facility] for digital flexion/extension, weak grasp, dysmetria General Strength Comments: 4-/5   Pt seated in W/C at end of session with W/C alarm donned, call light within reach and 4Ps assessed.     Therapy/Group: Individual Therapy  [name], OTD, OTR/L 07/08/2023, 12:44 PM

## 2023-07-08 NOTE — Progress Notes (Signed)
 Occupational Therapy Session Note  Patient Details  Name: Carl Skellenger Sr. MRN: [id number] Date of Birth: March 13, 1960  Today's Date: 07/08/2023 OT Individual Time: 5621-3086 OT Individual Time Calculation (min): 75 min    Short Term Goals: Week 5:  OT Short Term Goal 1 (Week 5): Pt will complete toileting at Min A with AE as necessary OT Short Term Goal 2 (Week 5): Pt will complete bathing at [facility] with AE as necessary  Skilled Therapeutic Interventions/Progress Updates:    SKilled OT intervention with focus on functional anb with RW, standing balance, sit<>stand, bathing at shower level, dressing with sit<>stand from EOB, and safety awareness to increase independence with BADLs and prepare for possible d/c tomorrow. All amb with RW at supervision. Pt donned pants and socks prior to transfer to TTB in shower. PT completed bathing with supervision using grab bars for sit<>stand from TTB. Pt used AE to assist with bathing. Pt returned to room and completed dressing with sit<>stand from EOB. Pt completed all dressing tasks with supervision sit<>stand. Standing balance throughout session with supervisoin. Reviewed safety recommendations. Pt returned to [facility] at completion of dressing. All needs within reach.   Therapy Documentation Precautions:  Precautions Precautions: Fall, Cervical, Other (comment) Recall of Precautions/Restrictions: Impaired Precaution/Restrictions Comments: Verbally reviewed cervical precautions. Required Braces or Orthoses: Cervical Brace Cervical Brace: Soft collar, At all times Restrictions Weight Bearing Restrictions Per Provider Order: No Pain: Pt deneis pain this morning   Therapy/Group: Individual Therapy  [name] 07/08/2023, 9:35 AM

## 2023-07-08 NOTE — Progress Notes (Signed)
 Physical Therapy Discharge Summary  Patient Details  Name: Carl Feeny Sr. MRN: [id number] Date of Birth: Mar 23, 1960  Date of Discharge from PT service:July 15, 2023   Patient has met 7 of 7 long term goals due to improved activity tolerance, improved balance, improved postural control, increased strength, decreased pain, ability to compensate for deficits, functional use of  right upper extremity, right lower extremity, left upper extremity, and left lower extremity, and improved coordination.  Patient to discharge mod I for PWC mobility, CGA/Supervision for transfers/gait with RW (from elevated surfaces).   Patient's care partner unavailable to provide the necessary physical and supervision assistance at discharge, as well as home access barriers due to stairs and HD schedule, so plan to d/c to SNF.   Reasons goals not met: n/a - all goals met or exceeded  Recommendation:  Patient will benefit from ongoing skilled PT services in skilled nursing facility setting to continue to advance safe functional mobility, address ongoing impairments in strength, balance, functional mobility, endurance, pain, and minimize fall risk.  Equipment: No equipment provided. To be provided at next venue of care.  Reasons for discharge: treatment goals met and discharge from hospital  Patient/family agrees with progress made and goals achieved: Yes  PT Discharge Precautions/Restrictions Precautions Precautions: Fall;Cervical;Other (comment) Required Braces or Orthoses: Cervical Brace Cervical Brace: Soft collar;At all times Restrictions Weight Bearing Restrictions Per Provider Order: No   Pain Interference Pain Interference Pain Effect on Sleep: 1. Rarely or not at all Pain Interference with Therapy Activities: 1. Rarely or not at all Pain Interference with Day-to-Day Activities: 1. Rarely or not at all Vision/Perception  Vision - History Ability to See in Adequate Light: 1  Impaired Perception Perception: Within Functional Limits Praxis Praxis: WFL  Cognition Overall Cognitive Status: Within Functional Limits for tasks assessed Arousal/Alertness: Awake/alert Memory: Appears intact Awareness: Appears intact Problem Solving: Appears intact Safety/Judgment: Appears intact Sensation Sensation Light Touch: Impaired Detail Peripheral sensation comments: Impaired BLE/BUE, distal>proximal, R>L Light Touch Impaired Details: Impaired RUE;Impaired LUE;Impaired RLE;Impaired LLE Hot/Cold: Impaired Detail Hot/Cold Impaired Details: Impaired RUE;Impaired LUE;Impaired RLE;Impaired LLE (reports RUE most impaired) Proprioception: Impaired by gross assessment (inconsistent) Stereognosis: Not tested Coordination Gross Motor Movements are Fluid and Coordinated: No Fine Motor Movements are Fluid and Coordinated: No Motor  Motor Motor: Tetraplegia;Abnormal postural alignment and control;Abnormal tone Motor - Discharge Observations: significant improvements in overall strength in BUE/BLE and trunk since admission, still with limitations in RUE from 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  Mobility Bed Mobility Bed Mobility: Supine to Sit;Sit to Supine Supine to Sit: Independent with assistive device Sit to Supine: Independent with assistive device Transfers Transfers: Sit to Stand;Stand Pivot Transfers;Stand to Sit Sit to Stand: Contact Guard/Touching assist (elevated surface) Stand to Sit: Contact Guard/Touching assist;Supervision/Verbal cueing Stand Pivot Transfers: Contact Guard/Touching assist Transfer (Assistive device): Rolling walker Locomotion  Gait Ambulation: Yes Gait Assistance: Contact Guard/Touching assist Gait Distance (Feet): 150 Feet Assistive device: Rolling walker Stairs / Additional Locomotion Stairs: Yes Stairs Assistance: Contact Guard/Touching assist Stair Management Technique: Two rails;Step to pattern Number of Stairs: 12 Height of Stairs:  (4  steps 6" and 8 steps 3") Ramp: Contact Guard/touching assist Pick up small object from the floor assist level: Supervision/Verbal cueing Pick up small object from the floor assistive device: Chief Operating Officer Mobility: Yes Wheelchair Assistance: Independent with Scientist, research (life sciences): Power Wheelchair Parts Management: Independent Distance: 500  Trunk/Postural Assessment  Cervical Assessment Cervical Assessment: Exceptions to [facility] (cervical precautions) Thoracic Assessment Thoracic  Assessment: Exceptions to [facility] (rounded shoulders) Lumbar Assessment Lumbar Assessment: Exceptions to [facility] (posterior pelvic tilt) Postural Control Postural Control: Deficits on evaluation Trunk Control: Decreased Righting Reactions: Delayed  Balance Balance Balance Assessed: Yes Standardized Balance Assessment Standardized Balance Assessment: Timed Up and Go Test Timed Up and Go Test TUG: Normal TUG Normal TUG (seconds): 77 Static Sitting Balance Static Sitting - Balance Support: Feet supported Static Sitting - Level of Assistance: 6: Modified independent (Device/Increase time) Dynamic Sitting Balance Dynamic Sitting - Balance Support: During functional activity Dynamic Sitting - Level of Assistance: 6: Modified independent (Device/Increase time) Static Standing Balance Static Standing - Level of Assistance: 5: Stand by assistance (with RW) Dynamic Standing Balance Dynamic Standing - Level of Assistance: 4: Min assist;5: Stand by assistance (with RW) Extremity Assessment  RUE Assessment Active Range of Motion (AROM) Comments: History of shoulder surgery (Limited ROM PTA); ~100 degrees of elbow extension, WFL for elbow flexion; non-functional digital extension/flexion General Strength Comments: 3-/5 LUE Assessment Active Range of Motion (AROM) Comments: ~90 degrees of shoulder abduction; WFL for elbow flexio/extension; [facility] for digital flexion/extension, weak  grasp, dysmetria General Strength Comments: 3-/5 RLE Assessment RLE Assessment: Exceptions to [facility] General Strength Comments: 3-/5 hip flexion and knee extension, 3+/5 knee flexion, ankle PF/DF LLE Assessment LLE Assessment: Exceptions to [facility] General Strength Comments: 4/5 except 3+/5 knee extension (tested in seated position)   Faye Hoops, PT, DPT, CBIS  07/15/2023, 8:00 AM

## 2023-07-08 NOTE — Progress Notes (Signed)
 Physical Therapy Session Note  Patient Details  Name: Carl Lacko Sr. MRN: [id number] Date of Birth: 1960/02/24  Today's Date: 07/08/2023 PT Individual Time: 0945-1100 PT Individual Time Calculation (min): 75 min   Short Term Goals:  Week 6:  PT Short Term Goal 1 (Week 6): = LTGs  Skilled Therapeutic Interventions/Progress Updates:    Session focused on grad day activities in preparation for d/c. (See d/c summary for complete details).   Focused on functional transfers from various surfaces including PWC, car, and mat table with overall CGA from elevated height (able to decrease to about 24" today with extra time and CGA approaching S for higher surfaces). Stair negotiation with cues for technique and tactile cues at times for RLE knee extension activation in stance completing 4 steps at 6" height and 8 steps at 3" height. Performed ramp negotiation with RW for community mobility on uneven surface with CGA for balance. Fall risk assessment performed (TUG) - see results below. Pt performed gait > 150' on unit with CGA, occasional standing rest breaks and cues for upright posture. Handout for HEP given to patient for strengthening exercises to be able to complete independently at d/c prior to start of next therapies.   Therapy Documentation Precautions:  Precautions Precautions: Fall, Cervical, Other (comment) Recall of Precautions/Restrictions: Impaired Precaution/Restrictions Comments: Verbally reviewed cervical precautions. Required Braces or Orthoses: Cervical Brace Cervical Brace: Soft collar, At all times Restrictions Weight Bearing Restrictions Per Provider Order: No    Pain:  Denies pain. Mobility: Bed Mobility Bed Mobility: Supine to Sit;Sit to Supine Supine to Sit: Independent with assistive device Sit to Supine: Independent with assistive device Transfers Transfers: Sit to Stand;Stand Pivot Transfers;Stand to Sit Sit to Stand: Contact Guard/Touching assist (elevated  surface) Stand to Sit: Contact Guard/Touching assist;Supervision/Verbal cueing Stand Pivot Transfers: Contact Guard/Touching assist Transfer (Assistive device): Rolling walker Locomotion : Gait Ambulation: Yes Gait Assistance: Contact Guard/Touching assist Gait Distance (Feet): 150 Feet Assistive device: Rolling walker Stairs / Additional Locomotion Stairs: Yes Stairs Assistance: Contact Guard/Touching assist Stair Management Technique: Two rails;Step to pattern Number of Stairs: 12 Height of Stairs:  (4 steps 6" and 8 steps 3") Ramp: Contact Guard/touching assist Wheelchair Mobility Wheelchair Mobility: Yes Wheelchair Assistance: Independent with Scientist, research (life sciences): Power Wheelchair Parts Management: Independent Distance: 500  Trunk/Postural Assessment : Cervical Assessment Cervical Assessment: Exceptions to [facility] (cervical precautions) Thoracic Assessment Thoracic Assessment: Exceptions to [facility] (rounded shoulders) Lumbar Assessment Lumbar Assessment: Exceptions to [facility] (posterior pelvic tilt) Postural Control Postural Control: Deficits on evaluation Trunk Control: Decreased Righting Reactions: Delayed  Balance: Balance Balance Assessed: Yes Standardized Balance Assessment Standardized Balance Assessment: Timed Up and Go Test Timed Up and Go Test TUG: Normal TUG Normal TUG (seconds): 77 Static Sitting Balance Static Sitting - Balance Support: Feet supported Static Sitting - Level of Assistance: 6: Modified independent (Device/Increase time) Dynamic Sitting Balance Dynamic Sitting - Balance Support: During functional activity Dynamic Sitting - Level of Assistance: 6: Modified independent (Device/Increase time) Static Standing Balance Static Standing - Level of Assistance: 5: Stand by assistance (with RW) Dynamic Standing Balance Dynamic Standing - Level of Assistance: 4: Min assist;5: Stand by assistance (with RW)     Therapy/Group: Individual  Therapy  Gita Lamb Amadeo June, PT, DPT, CBIS  07/08/2023, 12:35 PM

## 2023-07-08 NOTE — Progress Notes (Shared)
 Occupational Therapy Discharge Summary  Patient Details  Name: Carl Pavon Sr. MRN: [id number] Date of Birth: 03/23/1960  Date of Discharge from OT service:{Time; dates multiple:304500300}  {CHL IP REHAB OT TIME CALCULATIONS:304400400}   Patient has met {NUMBERS 0-12:18577} of {NUMBERS 0-12:18577} long term goals due to {due WU:9811914}.  Patient to discharge at overall {LOA:3049010} level.  Patient's care partner {care partner:3041650} to provide the necessary {assistance:3041652} assistance at discharge.    Reasons goals not met: ***  Recommendation:  Patient will benefit from ongoing skilled OT services in {setting:3041680} to continue to advance functional skills in the area of {ADL/iADL:3041649}.  Equipment: {equipment:3041657}  Reasons for discharge: {Reason for discharge:3049018}  Patient/family agrees with progress made and goals achieved: {Pt/Family agree with progress/goals:3049020}  OT Discharge Precautions/Restrictions  Precautions Precautions: Fall;Cervical;Other (comment) Required Braces or Orthoses: Cervical Brace Cervical Brace: Soft collar;At all times Restrictions Weight Bearing Restrictions Per Provider Order: No General   Vital Signs Therapy Vitals Temp: 97.7 F (36.5 C) Pulse Rate: 73 Resp: 18 BP: 121/69 Patient Position (if appropriate): Lying Oxygen Therapy SpO2: 100 % O2 Device: Room Air Pain Pain Assessment Pain Score: 0-No pain ADL ADL Eating: Set up Where Assessed-Eating: Wheelchair Grooming: Supervision/safety Where Assessed-Grooming: Careers adviser Bathing: Supervision/safety Where Assessed-Upper Body Bathing: Shower Lower Body Bathing: Supervision/safety Where Assessed-Lower Body Bathing: Shower Upper Body Dressing: Modified independent (Device) Where Assessed-Upper Body Dressing: Edge of bed Lower Body Dressing: Supervision/safety Where Assessed-Lower Body Dressing: Edge of bed Toileting: Supervision/safety Where  Assessed-Toileting: Bedside Commode, Actuary Transfer: Close supervision Toilet Transfer Method: Proofreader: Gaffer: Unable to assess Film/video editor: Close supervision Film/video editor Method: Designer, industrial/product: Grab bars, Emergency planning/management officer Vision Baseline Vision/History: 1 Wears glasses Patient Visual Report: No change from baseline Vision Assessment?: No apparent visual deficits Perception  Perception: Within Functional Limits Praxis Praxis: WFL Cognition Cognition Overall Cognitive Status: Within Functional Limits for tasks assessed Arousal/Alertness: Awake/alert Orientation Level: Person;Place;Situation Person: Oriented Place: Oriented Memory: Appears intact Awareness: Appears intact Problem Solving: Appears intact Safety/Judgment: Appears intact Brief Interview for Mental Status (BIMS) Repetition of Three Words (First Attempt): 3 Temporal Orientation: Year: Correct Temporal Orientation: Month: Accurate within 5 days Temporal Orientation: Day: Correct Recall: "Sock": Yes, no cue required Recall: "Blue": Yes, no cue required Recall: "Bed": Yes, no cue required BIMS Summary Score: 15 Sensation Sensation Light Touch: Impaired Detail Peripheral sensation comments: Impaired BLE/BUE, distal>proximal, R>L Light Touch Impaired Details: Impaired RUE;Impaired LUE;Impaired RLE;Impaired LLE Hot/Cold: Appears Intact Proprioception: Impaired by gross assessment Stereognosis: Not tested Coordination Gross Motor Movements are Fluid and Coordinated: No Fine Motor Movements are Fluid and Coordinated: No Motor  Motor Motor: Tetraplegia;Abnormal postural alignment and control;Abnormal tone Motor - Discharge Observations: significant improvements in overall strength in BUE/BLE and trunk since admission, still with limitations in RUE from 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 Mobility  Bed  Mobility Bed Mobility: Supine to Sit;Sit to Supine Supine to Sit: Supervision/Verbal cueing Sit to Supine: Supervision/Verbal cueing  Trunk/Postural Assessment  Cervical Assessment Cervical Assessment: Exceptions to [facility] (cervical precautions) Thoracic Assessment Thoracic Assessment: Exceptions to [facility] (rounded shoulders) Lumbar Assessment Lumbar Assessment: Exceptions to [facility] (posterior pelvic tilt) Postural Control Postural Control: Deficits on evaluation Trunk Control: Decreased Righting Reactions: Delayed  Balance Static Sitting Balance Static Sitting - Balance Support: Feet supported Static Sitting - Level of Assistance: 6: Modified independent (Device/Increase time) Dynamic Sitting Balance Dynamic Sitting - Balance Support: During functional activity Dynamic Sitting - Level of  Assistance: 6: Modified independent (Device/Increase time) Extremity/Trunk Assessment RUE Assessment Active Range of Motion (AROM) Comments: History of shoulder surgery (Limited ROM PTA); ~100 degrees of elbow extension, WFL for elbow flexion; non-functional digital extension/flexion General Strength Comments: 3-/5 LUE Assessment Active Range of Motion (AROM) Comments: ~90 degrees of shoulder abduction; WFL for elbow flexio/extension; [facility] for digital flexion/extension, weak grasp, dysmetria General Strength Comments: 3-/5   [name] 07/08/2023, 9:22 AM

## 2023-07-09 MED ORDER — CHLORHEXIDINE GLUCONATE CLOTH 2 % EX PADS
6.0000 | MEDICATED_PAD | Freq: Every day | CUTANEOUS | Status: DC
  Administered 2023-07-09: 6 via TOPICAL

## 2023-07-09 NOTE — Progress Notes (Signed)
 [facility] Progress Note   Subjective:   Worried about his dispo plan, noted primary team is appealing SNF denial. Pt denies SOB, CP, dizziness. Tolerated HD well yesterday with 3.8L UF.   Objective Vitals:   07/08/23 2000 07/08/23 2037 07/09/23 0500 07/09/23 0512  BP:  102/73  108/68  Pulse:  (!) 45  91  Resp:  16  16  Temp:  98.2 F (36.8 C)  98.1 F (36.7 C)  TempSrc:      SpO2:  100%  100%  Weight: 90.2 kg  86.3 kg   Height:       Physical Exam General: Alert male in C collar, NAD Heart: RRR, no murmurs, rubs or gallops  Lungs:CTA bilaterally, respirations unlabored on RA Abdomen: Soft, non-distended, +BS Extremities: no edema b/l lower extremities Dialysis Access:  RUE AVF + t/b  Additional Objective Labs: Basic Metabolic Panel: Recent Labs  Lab 07/04/23 0719 07/05/23 1332 07/08/23 1343  NA 133* 133* 131*  K 4.1 4.5 5.3*  CL 93* 95* 91*  CO2 25 25 21*  GLUCOSE 86 114* 110*  BUN 33* 58* 75*  CREATININE 5.91* 7.98* 9.65*  CALCIUM  8.6* 7.9* 8.2*  PHOS 3.9 5.0* 4.9*   Liver Function Tests: Recent Labs  Lab 07/04/23 0719 07/05/23 1332 07/08/23 1343  ALBUMIN  2.8* 2.6* 2.9*   No results for input(s): "LIPASE", "AMYLASE" in the last 168 hours. CBC: Recent Labs  Lab 07/04/23 0719 07/05/23 2107 07/08/23 1343  WBC 3.6* 4.8 5.5  HGB 9.5* 9.1* 9.4*  HCT 30.7* 29.3* 29.8*  MCV 89.5 88.3 89.0  PLT 262 237 270   Blood Culture    Component Value Date/Time   SDES FLUID 06/18/2023 1357   SPECREQUEST NONE 06/18/2023 1357   CULT  06/18/2023 1357    NO GROWTH 3 DAYS Performed at [facility], [address], [place], [place] [address]    REPTSTATUS 06/22/2023 FINAL 06/18/2023 1357    Cardiac Enzymes: No results for input(s): "CKTOTAL", "CKMB", "CKMBINDEX", "TROPONINI" in the last 168 hours. CBG: Recent Labs  Lab 07/03/23 1850  GLUCAP 147*   Iron Studies: No results for input(s): "IRON", "TIBC", "TRANSFERRIN", "FERRITIN" in  the last 72 hours. @lablastinr3 @ Studies/Results: No results found. Medications:   (feeding supplement) PROSource Plus  30 mL Oral BID BM   atorvastatin   10 mg Oral Q Thu   calcitRIOL   1.5 mcg Oral Q M,W,F-HD   carbamide peroxide  5 drop Right EAR BID   Chlorhexidine  Gluconate Cloth  6 each Topical Q0600   Chlorhexidine  Gluconate Cloth  6 each Topical Q0600   cinacalcet   120 mg Oral Q supper   darbepoetin (ARANESP ) injection - DIALYSIS  200 mcg Subcutaneous Q Tue-1800   gabapentin   300 mg Oral QHS   Gerhardt's butt cream   Topical BID   pantoprazole   40 mg Oral BID AC   psyllium  1 packet Oral TID   senna-docusate  2 tablet Oral BID   sevelamer  carbonate  3,200 mg Oral TID WC   sodium zirconium cyclosilicate   10 g Oral Daily   topiramate   25 mg Oral BID    Dialysis Orders: OP Dialysis Orders [facility] - MWF 4h  B400   85.5kg   2K bath  AVF   - Heparin  3000 units   - Mircera 60mcg IV q 2 weeks- last dose 05/24/23  - Calcitriol  1.5mcg PO q HD  Assessment/Plan: 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  Next HD tomorrow Hyperkalemia:Chronic issue.  On Lokelma  daily now. Continue to reinforce renal diet. HTN/volume: Tolerates high UF goals, 3.5-4L. Still over EDW. Attempt 4L again tomorrow.  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 We have ordered to further increase to aranesp  200 mcg weekly on Tuesdays for next dose on 5/27. 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 Resumed calcitriol   Nutrition: continue protein supplements. Patient reports appetite is better    Ramona Burner, PA-C 07/09/2023, 10:31 AM  [facility] Pager: [phone]

## 2023-07-09 NOTE — Progress Notes (Addendum)
 Patient ID: Carl Batzel Sr., male   DOB: 09-28-1960, 63 y.o.   MRN: [id number]  Awaiting results of peer to peer. MD to let know once decision made. Pt and team aware  8:48 am MD reports appeal was denied next option is for pt to appeal himself. Will get the number for him to do this and give him the option. Pt aware and waiting for [facility] to give the number for pt to appeal himself once loaded into the system. Encouraged pt to come up with a plan for home, since doing so well.  9:56 AM Fast appeal number for pt is [phone] he will need call to initate this. Gave to pt to call and start the appeal  12:20 PM pt has called and appealed the decision regarding SNF. Have re-sent clinicals to different fax [phone]  3;14 PM Have not heard anything about determination regarding pt's appeal. Have let Adelia Adolphus navigator abut this since pt will be here tomorrow and be on the HD schedule. Await pt's appeal determination

## 2023-07-09 NOTE — Plan of Care (Signed)
  Problem: SCI BOWEL ELIMINATION Goal: RH STG MANAGE BOWEL WITH ASSISTANCE Description: STG Manage Bowel with minimal Assistance. Outcome: Progressing   Problem: RH SKIN INTEGRITY Goal: RH STG SKIN FREE OF INFECTION/BREAKDOWN Description: Manage skin free of infection/breakdown with minimal assistance Outcome: Progressing   Problem: RH SAFETY Goal: RH STG ADHERE TO SAFETY PRECAUTIONS W/ASSISTANCE/DEVICE Description: STG Adhere to Safety Precautions With minimal Assistance/Device. Outcome: Progressing   Problem: RH PAIN MANAGEMENT Goal: RH STG PAIN MANAGED AT OR BELOW PT'S PAIN GOAL Description: Pain<4 w/ prns Outcome: Progressing

## 2023-07-09 NOTE — Progress Notes (Signed)
 Physical Therapy Session Note  Patient Details  Name: Carl Hellard Sr. MRN: [id number] Date of Birth: [birth year]  Today's Date: 07/09/2023 PT Individual Time: 0920-1015 PT Individual Time Calculation (min): 55 min   Short Term Goals: Week 6:  PT Short Term Goal 1 (Week 6): = LTGs  Skilled Therapeutic Interventions/Progress Updates:    Pt seated in PWC on arrival and agreeable to therapy. No complaint of pain. Session with focus on Sit to stand strengthening and gait endurance. Extended rest breaks d/t pt expressing his frustrations with situation. Provided psychosocial support as appropriate. Pt performed block practice of Sit to stand with 1-3 minute rest breaks between reps. Pt able to stand from as low as 24" with CGA/near supervision and 22.5" with min a. Pt then ambulated x269.3 ft with supervision and w/c follow.  Pt was able to self correct low foot clearance throughout with improving upright posture and safety. Pt remained in PWC and was left with needs in reach.   Therapy Documentation Precautions:  Precautions Precautions: Fall, Cervical, Other (comment) Recall of Precautions/Restrictions: Impaired Precaution/Restrictions Comments: Verbally reviewed cervical precautions. Required Braces or Orthoses: Cervical Brace Cervical Brace: Soft collar, At all times Restrictions Weight Bearing Restrictions Per Provider Order: No General:       Therapy/Group: Individual Therapy  Tex Filbert 07/09/2023, 10:39 AM

## 2023-07-09 NOTE — Discharge Summary (Cosign Needed Addendum)
 Physician Discharge Summary  Patient ID: Carl Becker Sr. MRN: [id number] DOB/AGE: 65-08-1960 65 y.o.  Admit date: 05/30/2023 Discharge date: 07/15/2023  Discharge Diagnoses:  Principal Problem:   Spinal cord injury, cervical region, sequela [facility]) Active Problems:   Headache disorder   Secondary hyperparathyroidism of renal origin (HCC)   Hyperkalemia   ESRD (end stage renal disease) (HCC)   Iron deficiency anemia, unspecified   Coping style affecting medical condition   Right hip pain   Discharged Condition: stable  Significant Diagnostic Studies: DG HIP UNILAT WITH PELVIS 2-3 VIEWS RIGHT Result Date: 06/28/2023 CLINICAL DATA:  Right hip pain. EXAM: DG HIP (WITH OR WITHOUT PELVIS) 2-3V RIGHT COMPARISON:  None Available. FINDINGS: No acute fracture or dislocation. The bones are osteopenic. Cystic changes of the right femoral head and neck measure up to 3.7 cm. Orthopedic referral is advised. Mild arthritic changes of the right hip. Total left hip arthroplasty. The soft tissues are unremarkable. Vascular calcifications. IMPRESSION: 1. No acute fracture or dislocation. 2. Cystic changes of the right femoral head and neck. Orthopedic referral is advised. Electronically Signed   By: Angus Bark M.D.   On: 06/28/2023 12:44    Labs:  Basic Metabolic Panel: Recent Labs  Lab 07/10/23 1351 07/12/23 1100 07/15/23 0915  NA 131* 128* 126*  K 5.1 4.7 5.3*  CL 93* 89* 89*  CO2 22 22 23   GLUCOSE 119* 124* 100*  BUN 67* 60* 75*  CREATININE 9.42* 8.57* 9.39*  CALCIUM  8.3* 8.2* 7.8*  PHOS 4.9* 5.0* 4.5    CBC: Recent Labs  Lab 07/10/23 1351 07/12/23 1100 07/15/23 0915  WBC 5.7 5.9 5.2  HGB 9.3* 10.0* 9.4*  HCT 29.7* 31.0* 29.5*  MCV 89.2 88.1 88.3  PLT 237 255 243    CBG: No results for input(s): "GLUCAP" in the last 168 hours.   Brief HPI:   Carl Micucci Sr. is a 63 y.o. male with history of ESRD- HD MWF, duodenitis w/ recurrent GIB, seizure, HTN, OSA, RCC s/p  bilateral nephrectomies, CVA, chronic HA who started developing progressive numbness with pain bilateral upper extremity, right foot drop x 2 weeks and balance issue who fell due to knees giving away and struck his face.  He was unable to lift his leg and had weakness BUE.  He presented to the ED 05/25/2023 for evaluation.  Workup done revealing subacute C4 burst fracture with severe canal stenosis superimposed on C4/5 and C5/6 advanced spondylolisthesis with cord edema and or evolving myelomalacia C3-C6.  He was found to have tetraplegia with weakness BUE greater than BLE and was taken to OR emergently for C4 anterior corpectomy with C3-C5 anterior fusion on 04/13 by Dr. Gwendlyn Lemmings.  Postprocedure gabapentin  with resumed for pain management.  PT OT was consulted and was working with patient on pre-gait activity.  He was requiring plus to assist with steady for mobility, and was able to sit at the edge of bed with close contact-guard assist and posterior lean.  He was independent prior to admission and CIR was recommended due to functional decline.   Hospital Course: Carl Preece Sr. was admitted to rehab 05/30/2023 for inpatient therapies to consist of PT and OT at least three hours five days a week. Past admission physiatrist, therapy team and rehab RN have worked together to provide customized collaborative inpatient rehab.  SCDs were used for DVT prophylaxis due to history of recurrent GI bleed.  BLE Dopplers done 4/28 were negative for DVT.  Pain has been managed with  use of oxycodone  on as needed basis. Melatonin added prn for management of insomnia. His blood pressures were monitored on TID basis and have been running low with SBP in 90's due to SCI. Midodrine  added to 30 minutes prior to HD session. Orthostatic hypotension has resolved and he is tolerating therapy without further drops in BP.   SIRS reaction with fever, hypotension and tachycardia treated with IVF as well as addition of Maxipime  and Vanc.  Work up was negative and and case discussed with ID. CT head and dopplers were negative.  Antibiotics discontinued on 4/27 and  fevers have resolved. He did report bilateral knee pain with increase in activity and  X rays done revealing moderate bilateral OA with small to moderate joint effusions .  He was treated with course of steroids with brief improvement and ortho consulted.  Right knee was tapped and injected with prednisone  on 05/06 with improvement in symptoms. He did report feeling right hip clunk as well as pain and X rays done on 5/16 showed cystic changes of femoral head and neck . Dr.Xu recommended follow up in office and hip pain has continued to be an issue and now causing left knee instability with increase in mobility. Left knee brace was ordered for support. Right hand pain and swelling has resolved. Xrays done were negative for fracture.   Hyperkalemia has been a chronic issue and has been managed with use of daily Lokelma  as well as encouragement and reinforcement of renal diet. Secondary HPTH has been managed with binders and calcitriol  has been resumed. Protein supplements added to help with wound healing. CBC has been monitored with routine labs and anemia of chronic disease has managed with addition of aranesp . HD has been ongoing on MWF at the end of the day to help with tolerance of therapy. He has been making gains with therapy but has had issues with right knee pain with increase in activity and going up and down stairs. Case discussed with ortho who felt aspiration would not be effective and another course of steroids could be attempted but patient did not feel this was indicated. He continues to require assistance with mobility and stairs and has elected on SNF for progressive therapy. He was discharged to [facility] on 07/15/23.     Rehab course: During patient's stay in rehab weekly team conferences were held to monitor patient's progress, set goals and discuss  barriers to discharge. At admission, patient required mod to max assist with basic ADL tasks and max assist with mobility. He has had improvement in activity tolerance, balance, postural control as well as ability to compensate for deficits. He has had improvement in functional use RUE/LUE  and RLE/LLE as well as improvement in awareness. He is able to complete ADL tasks with supervision to min assist level. He requires CGA to supervision for transfers and to ambulate 150' with use of RW.   Discharge disposition: 03-Skilled Nursing Facility  Diet: Renal diet. Limit fluids to 1200 cc/day  Special Instructions: Routine pressure relief measures 2.   Ice/heat to knee and/or hip prn pain or discomfort.  3.  Continue Collar till follow up with NS.    Allergies as of 07/15/2023       Reactions   Infed [iron Dextran] Other (See Comments)   Decreased BP    Vicodin [hydrocodone -acetaminophen ] Other (See Comments)   Decreased BP   Diclofenac Other (See Comments)   Caused hypotension        Medication  List     STOP taking these medications    acetaminophen  650 MG CR tablet Commonly known as: TYLENOL  Replaced by: acetaminophen  325 MG tablet   cinacalcet  60 MG tablet Commonly known as: SENSIPAR    cyanocobalamin  1000 MCG tablet Commonly known as: VITAMIN B12   DSS 100 MG Caps   folic acid  1 MG tablet Commonly known as: FOLVITE    multivitamin Tabs tablet   sucralfate  1 GM/10ML suspension Commonly known as: CARAFATE    traMADol  50 MG tablet Commonly known as: ULTRAM    vitamin E 180 MG (400 UNITS) capsule       TAKE these medications    acetaminophen  325 MG tablet Commonly known as: TYLENOL  Take 1-2 tablets (325-650 mg total) by mouth every 4 (four) hours as needed for mild pain (pain score 1-3). Replaces: acetaminophen  650 MG CR tablet   atorvastatin  10 MG tablet Commonly known as: LIPITOR Take 1 tablet (10 mg total) by mouth every Thursday.   augmented betamethasone   dipropionate 0.05 % ointment Commonly known as: DIPROLENE -AF Apply 1 Application topically 2 (two) times daily as needed for rash.   calcitRIOL  0.5 MCG capsule Commonly known as: ROCALTROL  Take 3 capsules (1.5 mcg total) by mouth every Monday, Wednesday, and Friday with hemodialysis.   cromolyn  4 % ophthalmic solution Commonly known as: OPTICROM  Place 1 drop into both eyes daily as needed (redness).   cyclobenzaprine  10 MG tablet Commonly known as: FLEXERIL  Take 1 tablet (10 mg total) by mouth 3 (three) times daily as needed for muscle spasms.   diphenhydrAMINE  25 mg capsule Commonly known as: BENADRYL  Take 1 capsule (25 mg total) by mouth every 6 (six) hours as needed for itching.   gabapentin  100 MG capsule Commonly known as: NEURONTIN  Take 3 capsules (300 mg total) by mouth at bedtime.   Gerhardt's butt cream Crea Apply 1 Application topically 2 (two) times daily.   Lokelma  10 g Pack packet Generic drug: sodium zirconium cyclosilicate  Take 1 packet by mouth daily.   melatonin 5 MG Tabs Take 1 tablet (5 mg total) by mouth at bedtime as needed.   midodrine  10 MG tablet Commonly known as: PROAMATINE  Take 1 tablet (10 mg total) by mouth every dialysis (30 min before each dialysis.).   oxyCODONE -acetaminophen  5-325 MG tablet Commonly known as: PERCOCET/ROXICET Take 1-2 tablets by mouth every 6 (six) hours as needed for severe pain (pain score 7-10) (5mg  for moderate pain and 10 mg for severe pain). What changed:  when to take this reasons to take this   pantoprazole  40 MG tablet Commonly known as: PROTONIX  Take 1 tablet (40 mg total) by mouth 2 (two) times daily before a meal. What changed:  when to take this reasons to take this   pentafluoroprop-tetrafluoroeth Aero Commonly known as: GEBAUERS Apply 1 Application topically as needed (topical anesthesia for hemodialysis).   polyethylene glycol powder 17 GM/SCOOP powder Commonly known as: GLYCOLAX /MIRALAX  Take  17 g by mouth 2 (two) times daily as needed for mild constipation. What changed:  when to take this reasons to take this   psyllium 95 % Pack Commonly known as: HYDROCIL/METAMUCIL Take 1 packet by mouth 3 (three) times daily.   senna-docusate 8.6-50 MG tablet Commonly known as: Senokot-S Take 2 tablets by mouth 2 (two) times daily.   sevelamer  carbonate 800 MG tablet Commonly known as: RENVELA  Take 3,200-4,000 mg by mouth 3 (three) times daily with meals.   topiramate  25 MG tablet Commonly known as: TOPAMAX  Take 1 tablet (25 mg total)  by mouth 2 (two) times daily.            Contact information for follow-up providers     Center, [facility]. Go on 07/17/2023.   Why: Schedule is Monday, Wednesday, Friday.  Arrive at 9:20 am for 9:45 am chair time. Contact information: [address] [place] [address] [phone]         Lanae Pinal, MD Follow up.   Specialty: Family Medicine Why: Call in 1-2 days for post hospital follow up Contact information: [address] [place] [address] [phone]         Celia Coles, MD Follow up.   Specialty: Physical Medicine and Rehabilitation Why: office will call you with follow up appointment Contact information: [address] [place] [address] [phone]         [name], MD Follow up.   Specialty: Neurosurgery Why: Call in 1-2 days for post hospital follow up Contact information: [address] [place] [address] [phone]              Contact information for after-discharge care     Destination     [facility], [facility] Preferred [facility] .   Service: Skilled Nursing Contact information: [address] [place]  [address] [phone]                     Signed: Zelda Hickman 07/15/2023, 3:38 PM

## 2023-07-09 NOTE — Progress Notes (Signed)
 Occupational Therapy Session Note  Patient Details  Name: Carl Mckamie Sr. MRN: [id number] Date of Birth: 14-Jul-1960  Today's Date: 07/09/2023 OT Individual Time: 1330-1430 OT Individual Time Calculation (min): 60 min    Short Term Goals: Week 5:  OT Short Term Goal 1 (Week 5): Pt will complete toileting at Min A with AE as necessary OT Short Term Goal 2 (Week 5): Pt will complete bathing at [facility] with AE as necessary  Skilled Therapeutic Interventions/Progress Updates:    Skilled OT intervention with focus on PWC mobility, sit<>stand, funcitonal amb with RW, BUE functional use, standing balance, and safety awareness to incease independence with BADLs. Pt maneuvered PWC in hallways and gyms locales wihtout incident and safely. All activities in gym. Sit<>stand and amb with RW to gather towels on various heights using reacher when appropriate. Pt stood at table to fold towels before amb with RW (towels draped over cross bar) to dirty linen bag. Pt returned to [facility]. Balance tasks at rebounder, tossing light therapy ball 3x15 and 1kg ball 2x20. No LOB noted. Pt returned to room and remained in PWC. All needs within reach.  Therapy Documentation Precautions:  Precautions Precautions: Fall, Cervical, Other (comment) Recall of Precautions/Restrictions: Impaired Precaution/Restrictions Comments: Verbally reviewed cervical precautions. Required Braces or Orthoses: Cervical Brace Cervical Brace: Soft collar, At all times Restrictions Weight Bearing Restrictions Per Provider Order: No Pain:  Pt denies pain this afternoon  Therapy/Group: Individual Therapy  [name] 07/09/2023, 2:31 PM

## 2023-07-09 NOTE — Progress Notes (Signed)
 Occupational Therapy Session Note  Patient Details  Name: Carl Corti Sr. MRN: [id number] Date of Birth: [birth year]  Today's Date: 07/09/2023 OT Individual Time: 0800-0900 OT Individual Time Calculation (min): 60 min    Short Term Goals: Week 5:  OT Short Term Goal 1 (Week 5): Pt will complete toileting at Min A with AE as necessary OT Short Term Goal 2 (Week 5): Pt will complete bathing at [facility] with AE as necessary  Skilled Therapeutic Interventions/Progress Updates:      Therapy Documentation Precautions:  Precautions Precautions: Fall, Cervical, Other (comment) Recall of Precautions/Restrictions: Impaired Precaution/Restrictions Comments: Verbally reviewed cervical precautions. Required Braces or Orthoses: Cervical Brace Cervical Brace: Soft collar, At all times Restrictions Weight Bearing Restrictions Per Provider Order: No General: "I guess I'm not leaving today." Pt seated edge of bed upon OT arrival, agreeable to OT session. OT providing skilled intervention with [facility] and hand strengthening tasks.   Pain: no pain reported  ADL: OT providing skilled intervention on ADL retraining in order to increase independence with tasks and increase activity tolerance. Pt completed dressing seated EOB with SBA with no AD and increased time required.   Exercises: Pt participated in [facility] activity with putty and handwriting, pt writing with pen with built up grip and using putty in circle with resistance for finger extension,abduction and MCP flexion for increased dexterity, strength and coordination in order to complete ADLs such as buttoning shirts. Pt completed tasks with PRN rest breaks.  Pt seated in W/C at end of session with W/C alarm donned, call light within reach and 4Ps assessed.     Therapy/Group: Individual Therapy  [name], OTD, OTR/L 07/09/2023, 4:15 PM

## 2023-07-09 NOTE — Progress Notes (Signed)
 PROGRESS NOTE   Subjective/Complaints:  Pt reports L knee is buckling so needs brace today on L knee not R knee like usual.  Doing better on 9 hole Peg test, but still not normal due to weak grip.   Cannot make fist on L still.   R hip still bad pain.   ROS:    Pt denies SOB, abd pain, CP, N/V/C/D, and vision changes  B/L R>L knee pain (+)- R knee very improved +R hip pain-managed but severe arthritis  Objective:   No results found.      Recent Labs    07/08/23 1343  WBC 5.5  HGB 9.4*  HCT 29.8*  PLT 270     Recent Labs    07/08/23 1343  NA 131*  K 5.3*  CL 91*  CO2 21*  GLUCOSE 110*  BUN 75*  CREATININE 9.65*  CALCIUM  8.2*     Intake/Output Summary (Last 24 hours) at 07/09/2023 1015 Last data filed at 07/09/2023 0756 Gross per 24 hour  Intake 236 ml  Output 2800 ml  Net -2564 ml        Physical Exam: Vital Signs Blood pressure 108/68, pulse 91, temperature 98.1 F (36.7 C), resp. rate 16, height 6\' 1"  (1.854 m), weight 86.3 kg, SpO2 100%.    General: awake, alert, appropriate, sitting EOB; OT at bedside; trying to dress himself; NAD HENT: conjugate gaze; oropharynx moist CV: regular rate; no JVD Pulmonary: CTA B/L; no W/R/R- good air movement GI: soft, NT, ND, (+)BS Psychiatric: appropriate Neurological: Ox3  MSK: cannot make fist at all on L hand-  and can make fist on R hand, but only for 3 seconds.   PRIOR EXAMS:MSK- R lateral and groin TTP- c/w trochanteric bursitis and R joint pain  Neurological: Ox3, follows commands, cranial nerves II through XII grossly intact,   MSK- R knee much less swollen MsK: B/L knees with effusions- less TTP on R knee this AM- looks good- mild effusion , no erythema, not really focally warm to touch- Both have effusions, - R hand swelling looks good/resolved  MSK- R hand swelling esp ulnar side of hand/dorsum and L wrist-stable this AM RUE-  Biceps 4/5; WE 4-/5; Triceps 2/5; Grip 2-/5 and FA 1/5 LUE- 4/5 except triceps 3+ to 4-/5 RLE- HF 1 to 2-/5; KE 2-/5; DF/PF 2-/5 LLE- HF 3/5; KE 4-/5; DF/PF 4/5    Skin: C/D/I. Multiple lesions on BL legs with various stages of healing.  + RUE fistula - intact   MSK:      LLE knee with palpable effusion, no warmth, no ttp Mood: Pleasant affect, appropriate mood.  Neurologic exam: Alert and oriented x 4, fluent, follows commands, cranial nerves II through XII grossly intact, no hypertonia noted  Sensation: Reduced to light touch in distal right lower extremity to the mid-thigh       Strength:                RUE: 2/5 SA, 2/5 EF, 2/5 EE, 2/5 WE, 2/5 FF, 2/5 FA                LUE:  3/5 SA, 3/5 EF, 3/5 EE,  3/5 WE, 3/5 FF, 3/5 FA                RLE: 2/5 HF, 2/5 KE, 2/5  DF, 2/5  EHL, 2/5  PF                 LLE:  3/5 HF, 4/5 KE, 4/5  DF, 4/5  EHL, 4/5  PF  Neuroexam stable 4/21  Assessment/Plan: 1. Functional deficits which require 3+ hours per day of interdisciplinary therapy in a comprehensive inpatient rehab setting. Physiatrist is providing close team supervision and 24 hour management of active medical problems listed below. Physiatrist and rehab team continue to assess barriers to discharge/monitor patient progress toward functional and medical goals  Care Tool:  Bathing    Body parts bathed by patient: Right arm, Left arm, Chest, Abdomen, Front perineal area, Buttocks, Face, Left lower leg, Right lower leg, Left upper leg, Right upper leg   Body parts bathed by helper: Left arm, Front perineal area, Buttocks, Right lower leg, Left lower leg     Bathing assist Assist Level: Supervision/Verbal cueing     Upper Body Dressing/Undressing Upper body dressing   What is the patient wearing?: Pull over shirt    Upper body assist Assist Level: Independent with assistive device    Lower Body Dressing/Undressing Lower body dressing      What is the patient wearing?: Pants,  Underwear/pull up     Lower body assist Assist for lower body dressing: Supervision/Verbal cueing     Toileting Toileting Toileting Activity did not occur (Clothing management and hygiene only): N/A (no void or bm) (anuric, HD patient)  Toileting assist Assist for toileting: Supervision/Verbal cueing     Transfers Chair/bed transfer  Transfers assist     Chair/bed transfer assist level: Contact Guard/Touching assist     Locomotion Ambulation   Ambulation assist   Ambulation activity did not occur: Safety/medical concerns  Assist level: Contact Guard/Touching assist Assistive device: Walker-rolling Max distance: 150   Walk 10 feet activity   Assist  Walk 10 feet activity did not occur: Safety/medical concerns  Assist level: Contact Guard/Touching assist Assistive device: Walker-rolling   Walk 50 feet activity   Assist Walk 50 feet with 2 turns activity did not occur: Safety/medical concerns  Assist level: Contact Guard/Touching assist Assistive device: Walker-rolling    Walk 150 feet activity   Assist Walk 150 feet activity did not occur: Safety/medical concerns  Assist level: Contact Guard/Touching assist Assistive device: Walker-rolling    Walk 10 feet on uneven surface  activity   Assist Walk 10 feet on uneven surfaces activity did not occur: Safety/medical concerns   Assist level: Contact Guard/Touching assist Assistive device: Walker-rolling   Wheelchair     Assist Is the patient using a wheelchair?: Yes Type of Wheelchair: Power    Wheelchair assist level: Independent Max wheelchair distance: >500    Wheelchair 50 feet with 2 turns activity    Assist        Assist Level: Independent   Wheelchair 150 feet activity     Assist      Assist Level: Independent   Blood pressure 108/68, pulse 91, temperature 98.1 F (36.7 C), resp. rate 16, height 6\' 1"  (1.854 m), weight 86.3 kg, SpO2 100%.  Medical Problem List  and Plan: 1. 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   2.  Antithrombotics: -DVT/anticoagulation:  Mechanical: Sequential compression devices, below knee Bilateral lower extremities             -antiplatelet therapy: N/A--no antiplatelet due to hx of recurrent GIB  4/25- d/w family- they agree that Lovenox  not appropriate at this time.   -06/09/23 ordered dopplers to r/o clots given low grade temps recently  4/28- LE Dopplers (-) for DVT 3. Pain Management:  oxycodone  prn. Gabapentin  300 mg/hs.              --has Flexeril  10 mg TID prn              - patient with intermittent, severe b/l headache - add topomax 25 mg BID    4/19- pt rating pain 07/19/08 which is "tolerable" 5/7- got Ortho to come and take fluid off R knee- got 100cc off- pain is no more than 1/10 this AM! 5/8-5/9 knee still doing well 5/13-5/14-  pain controlled- allowed him to walk further yesterday in therapy- still cannot do more than 1 step 5/16- will get R hip xray due to clunk and R hi pain -06/29/23 R hip xray with cystic changes of femoral head/neck, also degeneration on my review- will need ortho f/up but doubt need for emergent consultation. Dr. Christiane Cowing is his primary ortho 5/20- Pt's Ortho responded and knows about Deg arthritis in R hip- but cannot address while in hospital  5/26- no change on R hip pain 5/27- still bad and now L knee buckling- placed L knee brace 4. Mood/Behavior/Sleep: LCSW to follow for evaluation and support             -antipsychotic agents: N/A             --melatonin prn for  insomnia.  -06/09/23 poor sleep, d/t interruptions, asked nursing to minimize interruptions between 11p-5a-- sleeping better lately 5. Neuropsych/cognition: This patient is capable of making decisions on his own behalf. 5/8- Ox3 and appropriate 6. Skin/Wound Care: Routine pressure relief measures.   4/19- due to ulcers on legs, on low air loss mattress 7. Fluids/Electrolytes/Nutrition: Strict I/O. Daily weights. Labs with HD. 8. ESRD- HD MWF: Schedule HD at the end of the day to help with tolerance of therapy             --Sensipar  and Renvela  for metabolic bone disease.   - Patient reports no longer makes urine  5/13 - last HD yesterday 5/15- last HD yesterday=- they were not able to finish due to equipment issues- will need to monitor fluid intake today- so can make it til tomorrow- they are not doing more today.  9. Anemia of chronic disease: Monitor H/H, aranesp  5/9- Hb 7.6 in last 24 hours- done yesterday- feels better- no dizziness or fatigue- so doesn't feel transfusion is appropriate 5/12 HGB up to 8.1 , improved continue to monitor.  He is on Aranesp  per nephrology 5/15- pt needs to have CBC rechecked in HD tomorrow-  -06/29/23 Hgb 7.8, stable. Monitor.  5/20- Hb 8.0 5/21- refused labs this AM since getting in  HD- overall labs looking better 2 days ago 5/22- Hb 9.5 this AM!! 07/06/23 Hgb 9.1 yesterday; monitor 5/26- HD today- will get labs at that point 10. H/o recurrent GIB/[employer]- will only take blood products in absolute emergency per Renal note: Continue Protonix  BID 11. H/o Hypotension: Has had drop  in BP with HD. Now cervical SCI.  --Monitor for symptoms day after HD and with activity. -4/18 baseline blood pressure stable, monitor with activity 4/19- BP actually slightly elevated today- 140s-150's- con't regimen, since can drop with HD and therapy 4/20- BP actually doing better today- in 100s'110s- however HR elevated, likely to fluid overload 4/21 nephrology  started midodrine  10mg  before dialysis, BP appears to be doing a little better today 5/8- BP on low side in last 24 hours- likely made worse due to HD yesterday- denies OH Sx's. Con't to monitor trend 5/9- BP is running in 90's systolic- doesn't feel dizzy or lightheaded sitting, but could drop when standing-will d/w therapy 5/22- 5/25- 5/26- Drop in BP yesterday with standing to 90's systolic- felt dizzy- will on Midodrine - will wait to increase 5/27- tolerating therapy without drops in BP Vitals:   07/07/23 2011 07/08/23 0548 07/08/23 1514 07/08/23 1630  BP: 111/68 121/69 124/77 134/79   07/08/23 1643 07/08/23 1700 07/08/23 1730 07/08/23 1800  BP: 125/84 116/76 101/68 125/71   07/08/23 1830 07/08/23 1948 07/08/23 2037 07/09/23 0512  BP: 110/76 100/67 102/73 108/68     12. Constipation: Small BM on 04/14 --had of nulytey yesterday and another today? -Xray 4/17 without excessive stool, patient reports stool still little hard yesterday.  Will increase MiraLAX  to 3 times a day, continue Senokot twice a day. Suppository PRN   06/09/23 LBM yesterday, cont regimen 4/28- LBM overnight- has been refusing miralax - made it prn- taking metamucil  5/18- LBM this morning per pt  5/20- LBM yesterday 5/23- LBM 5/20- needs to have a BM- since having HD today won't give Sorbitol , but will need Saturday if no BM -07/07/23 LBM yesterday per pt but last documented 5/23, monitor 5/27- LBM yesterday 13. ?left knee effusion. Not currently bothersome. Consider imaging.  14.  SIRS- Fever/hypotension/tachycardia - 4/21 Currently afebrile, he got to 50 cc IVF yesterday, CXR negative, EKG sinus tachycardia. Patient was started on Maxipime  and vancomycin  yesterday.  Appears to be doing much better today.  WBC not elevated at 7.1.  Continue to follow blood cultures 4/22- Blood Cx's (-) so far- WBC 7.3- will con't IV ABX until know final results of blood Cx's- CXR (-)- but temp was 103.2 yesterday overnight.   4/23- Blood C'x (-) x2 days so far- T 99.9 this Am after a few sips of coffee- was 100.4 this AM- will con't IV ABX since concerned about possible bacteremia- esp since having low grade temps still 4/24- No more fevers, even low grade- no growth on blood Cx' bu it says that didn't get enough blood, so much be falsely negative 4/25- No growth for 5 days, but it also said not enough blood to be sure was a true negative. Will change stop date til tomorrow -06/08/23 at 3am temp recorded as 100.3 but 5am was 98.3; will get CBC w/diff, monitor temps closely today, if continued low grade temps, will reach out to ID. BCx NGTD x5d. Didn't get vanc yesterday? Or cefepime ? Unclear why. Cefepime  started this morning at 0011. -06/09/23 temp 100.3 at 3am yesterday and then 100 later in the day, but no fevers since, no tachycardia.  -WBC  yesterday 8.9.  -Now with some confusion; spoke with Dr. Levern Reader of ID, stated cefepime  could be the cause of the confusion -recommended we stop all abx (which ended yesterday anyway) -watch the patient OFF abx -if fevers >100.3 occur then she recommended repeat BCx and CXR and could call her back to see if any abx would be advised at that time.  -Head CT and dopplers ordered as well, as above 4/28- CT looks good- no fever, WBC is OK- off ABX 4/29- Also Dopplers (-) feeling good this AM 06/16/23 seems to have resolved. 15. Tachycardia- resolved 4/22- doing better- right at 100 this AM- will monitor- likely can be from dregs of infection vs OH. -will monitor  4/23- Tachycardia improving- 4/24-27 HR in 90's right now- doing better  4/29- 4/30- HR running in 90s-low 100's today 5/12 heart rate in the 70s and 80s primarily, continue to monitor 5/20- HR back into low 100's- but is feeling good- denies Sx's.  5/22- back to running 80s-high 90's- will monitor trend    07/09/2023    5:12 AM 07/09/2023    5:00 AM 07/08/2023    8:37 PM  Vitals with BMI  Weight  190 lbs 5 oz   Systolic  108  102  Diastolic 68  73  Pulse 91  45    16. R hand swelling  4/23- doing ice- will do xrays 4/24- Xray doesn't look like cause of swelling, however could be due to new shunt? 4/30- resolved 17. Hypocalcemia 4/24- his Albumin  is 2.1, so probably ok, and phos coming down- will leave to renal- thank you for their help  5/1- Ca 9.3 5/8- Ca 8.8 5/15- Ca 8.5 18. Hyperkalemia 4/28- K+ 6.3 today- got hypoosmotic bath to reduce K+ per note from RN/HD nurse? Also has Na of 127- being addressed by HD/renal- Phos also 5.8- but again, getting HD today 5/1- K+ down to 5.6 and Na 129 5/6- Na 133 and K+ down to 4.7 from 5.6 5/7- refused labs this AM- wants in HD 5/9- K_ came back as 4.5 5/12 potassium elevated today, chronic issue has dialysis today.  Treatment per renal team 5/14- K+ down to 5.7- from 6.2- has HD today 06/29/23 down to 5.3 yesterday, had dialysis yesterday.  5/19- HD today 5/20- K+ stayed at 5.3- doing better 5/24- K+ 4.5 yesterday- best it's been 5/27- K+ 5.3- per renal 19. R>L knee pain 4/29- will ask Ortho if they will tap his R knee- I have no problem doing injection of knee, however with so much fluid, I would rather Ortho tap him and then do steroid injection.   4/30- we added Prednisone  40 mg daily x 3 days- to see if helps knee pain  5/1- mild reduction in pain so far 5/6- Pt has short term improvement from Prednisone  dosing for knees- con't percocet prn 5/7- since pt was doing so much worse with therapy, asked Ortho to come and tap knee and take off fluid- do steroid injection- they drew off 100CC- 5 different draws required- fluid is pending, but has a lot of WBCs- no organisms on gram stain.   5/8- no growth for 24 hours- R knee pain so much better  -06/22/23 NGTD x3 days, monitor  5/14- No growth  - pain so much better after 100cc fluid removed and got injection 20.  R hip pain 5/16- will get R hip xray- also c/w trochanteric bursitis on exam- if doesn't improve,  will do trochanteric bursa injection Tuesday -06/29/23  R hip xray with cystic changes of femoral head/neck, also degeneration on my review- will need ortho f/up but doubt need for emergent consultation. Dr. Christiane Cowing is his primary ortho.  5/20- Dr Christiane Cowing wrote that he has severe arthritis, but needs to be out of hospital to get hip replacement.    I spent a total of  53  minutes on total care today- >50% coordination of care- due to  D/w insurance for P2P- with nursing about L knee brace- also with OT about plan and SW- also spoke with team conference to f/u on progress  LOS: 40 days A FACE TO FACE EVALUATION WAS PERFORMED  [name] 07/09/2023, 10:15 AM

## 2023-07-10 LAB — RENAL FUNCTION PANEL
Albumin: 2.8 g/dL — ABNORMAL LOW (ref 3.5–5.0)
Anion gap: 16 — ABNORMAL HIGH (ref 5–15)
BUN: 67 mg/dL — ABNORMAL HIGH (ref 8–23)
CO2: 22 mmol/L (ref 22–32)
Calcium: 8.3 mg/dL — ABNORMAL LOW (ref 8.9–10.3)
Chloride: 93 mmol/L — ABNORMAL LOW (ref 98–111)
Creatinine, Ser: 9.42 mg/dL — ABNORMAL HIGH (ref 0.61–1.24)
GFR, Estimated: 6 mL/min — ABNORMAL LOW (ref >60)
Glucose, Bld: 119 mg/dL — ABNORMAL HIGH (ref 70–99)
Phosphorus: 4.9 mg/dL — ABNORMAL HIGH (ref 2.5–4.6)
Potassium: 5.1 mmol/L (ref 3.5–5.1)
Sodium: 131 mmol/L — ABNORMAL LOW (ref 135–145)

## 2023-07-10 LAB — CBC
HCT: 29.7 % — ABNORMAL LOW (ref 39.0–52.0)
Hemoglobin: 9.3 g/dL — ABNORMAL LOW (ref 13.0–17.0)
MCH: 27.9 pg (ref 26.0–34.0)
MCHC: 31.3 g/dL (ref 30.0–36.0)
MCV: 89.2 fL (ref 80.0–100.0)
Platelets: 237 10*3/uL (ref 150–400)
RBC: 3.33 MIL/uL — ABNORMAL LOW (ref 4.22–5.81)
RDW: 16.8 % — ABNORMAL HIGH (ref 11.5–15.5)
WBC: 5.7 10*3/uL (ref 4.0–10.5)
nRBC: 0 % (ref 0.0–0.2)

## 2023-07-10 MED ORDER — HEPARIN SODIUM (PORCINE) 1000 UNIT/ML DIALYSIS
3000.0000 [IU] | Freq: Once | INTRAMUSCULAR | Status: AC
  Administered 2023-07-10: 3000 [IU] via INTRAVENOUS_CENTRAL

## 2023-07-10 MED ORDER — HEPARIN SODIUM (PORCINE) 1000 UNIT/ML IJ SOLN
INTRAMUSCULAR | Status: AC
  Filled 2023-07-10: qty 3

## 2023-07-10 NOTE — Progress Notes (Signed)
 PROGRESS NOTE   Subjective/Complaints:  Pt reports no specific issues- has pain still in R hip> R knee- and L knee still buckling some.   Also reports spoke with insurance company- I'm concerned they will deny his stay here after thy  decide on appeal-   We discussed this in dpeth.   ROS:    Pt denies SOB, abd pain, CP, N/V/C/D, and vision changes  B/L R>L knee pain (+)- R knee very improved +R hip pain-managed but severe arthritis  Objective:   No results found.      Recent Labs    07/08/23 1343  WBC 5.5  HGB 9.4*  HCT 29.8*  PLT 270     Recent Labs    07/08/23 1343  NA 131*  K 5.3*  CL 91*  CO2 21*  GLUCOSE 110*  BUN 75*  CREATININE 9.65*  CALCIUM  8.2*     Intake/Output Summary (Last 24 hours) at 07/10/2023 0830 Last data filed at 07/09/2023 1427 Gross per 24 hour  Intake 236 ml  Output --  Net 236 ml        Physical Exam: Vital Signs Blood pressure 122/69, pulse 80, temperature 98.6 F (37 C), temperature source Oral, resp. rate 18, height 6\' 1"  (1.854 m), weight 86.3 kg, SpO2 100%.     General: awake, alert, appropriate, sitting up in  power w/c; NAD HENT: conjugate gaze; oropharynx moist CV: regular rate; and rhythm no JVD Pulmonary: CTA B/L; no W/R/R- good air movement GI: soft, NT, ND, (+)BS Psychiatric: appropriate- bright affect Neurological: Ox3   MSK: cannot make fist at all on L hand-  and can make fist on R hand, but only for 3 seconds.   PRIOR EXAMS:MSK- R lateral and groin TTP- c/w trochanteric bursitis and R joint pain  Neurological: Ox3, follows commands, cranial nerves II through XII grossly intact,   MSK- R knee much less swollen MsK: B/L knees with effusions- less TTP on R knee this AM- looks good- mild effusion , no erythema, not really focally warm to touch- Both have effusions, - R hand swelling looks good/resolved  MSK- R hand swelling esp ulnar side  of hand/dorsum and L wrist-stable this AM RUE- Biceps 4/5; WE 4-/5; Triceps 2/5; Grip 2-/5 and FA 1/5 LUE- 4/5 except triceps 3+ to 4-/5 RLE- HF 1 to 2-/5; KE 2-/5; DF/PF 2-/5 LLE- HF 3/5; KE 4-/5; DF/PF 4/5    Skin: C/D/I. Multiple lesions on BL legs with various stages of healing.  + RUE fistula - intact   MSK:      LLE knee with palpable effusion, no warmth, no ttp Mood: Pleasant affect, appropriate mood.  Neurologic exam: Alert and oriented x 4, fluent, follows commands, cranial nerves II through XII grossly intact, no hypertonia noted  Sensation: Reduced to light touch in distal right lower extremity to the mid-thigh       Strength:                RUE: 2/5 SA, 2/5 EF, 2/5 EE, 2/5 WE, 2/5 FF, 2/5 FA                LUE:  3/5 SA,  3/5 EF, 3/5 EE, 3/5 WE, 3/5 FF, 3/5 FA                RLE: 2/5 HF, 2/5 KE, 2/5  DF, 2/5  EHL, 2/5  PF                 LLE:  3/5 HF, 4/5 KE, 4/5  DF, 4/5  EHL, 4/5  PF  Neuroexam stable 4/21  Assessment/Plan: 1. Functional deficits which require 3+ hours per day of interdisciplinary therapy in a comprehensive inpatient rehab setting. Physiatrist is providing close team supervision and 24 hour management of active medical problems listed below. Physiatrist and rehab team continue to assess barriers to discharge/monitor patient progress toward functional and medical goals  Care Tool:  Bathing    Body parts bathed by patient: Right arm, Left arm, Chest, Abdomen, Front perineal area, Buttocks, Face, Left lower leg, Right lower leg, Left upper leg, Right upper leg   Body parts bathed by helper: Left arm, Front perineal area, Buttocks, Right lower leg, Left lower leg     Bathing assist Assist Level: Supervision/Verbal cueing     Upper Body Dressing/Undressing Upper body dressing   What is the patient wearing?: Pull over shirt    Upper body assist Assist Level: Independent with assistive device    Lower Body Dressing/Undressing Lower body  dressing      What is the patient wearing?: Pants, Underwear/pull up     Lower body assist Assist for lower body dressing: Supervision/Verbal cueing     Toileting Toileting Toileting Activity did not occur (Clothing management and hygiene only): N/A (no void or bm) (anuric, HD patient)  Toileting assist Assist for toileting: Supervision/Verbal cueing     Transfers Chair/bed transfer  Transfers assist     Chair/bed transfer assist level: Contact Guard/Touching assist     Locomotion Ambulation   Ambulation assist   Ambulation activity did not occur: Safety/medical concerns  Assist level: Contact Guard/Touching assist Assistive device: Walker-rolling Max distance: 150   Walk 10 feet activity   Assist  Walk 10 feet activity did not occur: Safety/medical concerns  Assist level: Contact Guard/Touching assist Assistive device: Walker-rolling   Walk 50 feet activity   Assist Walk 50 feet with 2 turns activity did not occur: Safety/medical concerns  Assist level: Contact Guard/Touching assist Assistive device: Walker-rolling    Walk 150 feet activity   Assist Walk 150 feet activity did not occur: Safety/medical concerns  Assist level: Contact Guard/Touching assist Assistive device: Walker-rolling    Walk 10 feet on uneven surface  activity   Assist Walk 10 feet on uneven surfaces activity did not occur: Safety/medical concerns   Assist level: Contact Guard/Touching assist Assistive device: Walker-rolling   Wheelchair     Assist Is the patient using a wheelchair?: Yes Type of Wheelchair: Power    Wheelchair assist level: Independent Max wheelchair distance: >500    Wheelchair 50 feet with 2 turns activity    Assist        Assist Level: Independent   Wheelchair 150 feet activity     Assist      Assist Level: Independent   Blood pressure 122/69, pulse 80, temperature 98.6 F (37 C), temperature source Oral, resp. rate  18, height 6\' 1"  (1.854 m), weight 86.3 kg, SpO2 100%.  Medical Problem List and Plan: 1. 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  Antithrombotics: -DVT/anticoagulation:  Mechanical: Sequential compression devices, below knee Bilateral lower extremities             -antiplatelet therapy: N/A--no antiplatelet due to hx of recurrent GIB  4/25- d/w family- they agree that Lovenox  not appropriate at this time.   -06/09/23 ordered dopplers to r/o clots given low grade temps recently  4/28- LE Dopplers (-) for DVT 3. Pain Management:  oxycodone  prn. Gabapentin  300 mg/hs.              --has Flexeril  10 mg TID prn              - patient with intermittent, severe b/l headache - add topomax 25 mg BID    4/19- pt rating pain 07/19/08 which is "tolerable" 5/7- got Ortho to come and take fluid off R knee- got 100cc off- pain is no more than 1/10 this AM! 5/8-5/9 knee still doing well 5/13-5/14-  pain controlled- allowed him to walk further yesterday in therapy- still cannot do more than 1 step 5/16- will get R hip xray due to clunk and R hi pain -06/29/23 R hip xray with cystic changes of femoral head/neck, also degeneration on my review- will need ortho f/up but doubt need for emergent consultation. Dr. Christiane Cowing is his primary ortho 5/20- Pt's Ortho responded and knows about Deg arthritis in R hip- but cannot address while in hospital  5/26- no change on R hip pain 5/27- still bad and now L knee buckling- placed L knee brace 4. Mood/Behavior/Sleep: LCSW to follow for evaluation and support             -antipsychotic agents: N/A              --melatonin prn for insomnia.  -06/09/23 poor sleep, d/t interruptions, asked nursing to minimize interruptions between 11p-5a-- sleeping better lately 5. Neuropsych/cognition: This patient is capable of making decisions on his own behalf. 5/8- Ox3 and appropriate 6. Skin/Wound Care: Routine pressure relief measures.   4/19- due to ulcers on legs, on low air loss mattress 7. Fluids/Electrolytes/Nutrition: Strict I/O. Daily weights. Labs with HD. 8. ESRD- HD MWF: Schedule HD at the end of the day to help with tolerance of therapy             --Sensipar  and Renvela  for metabolic bone disease.   - Patient reports no longer makes urine  5/13 - last HD yesterday 5/15- last HD yesterday=- they were not able to finish due to equipment issues- will need to monitor fluid intake today- so can make it til tomorrow- they are not doing more today.  5/28- HD today 9. Anemia of chronic disease: Monitor H/H, aranesp  5/9- Hb 7.6 in last 24 hours- done yesterday- feels better- no dizziness or fatigue- so doesn't feel transfusion is appropriate 5/12 HGB up to 8.1 , improved continue to monitor.  He is on Aranesp  per nephrology 5/15- pt needs to have CBC rechecked in HD tomorrow-  -06/29/23 Hgb 7.8, stable. Monitor.  5/20- Hb 8.0 5/21- refused labs this AM since getting in HD- overall labs looking  better 2 days ago 5/22- Hb 9.5 this AM!! 5/28- Labs today? HD today 10. H/o recurrent GIB/Jehovah's Witness- will only take blood products in absolute emergency per Renal note: Continue Protonix  BID 11. H/o Hypotension: Has had drop  in BP with HD. Now cervical SCI.  --Monitor for symptoms day after HD and with activity. -4/18 baseline blood pressure stable, monitor with activity 4/19- BP actually slightly elevated today- 140s-150's- con't regimen, since can drop with HD and therapy 4/20- BP actually doing better today- in 100s'110s- however HR elevated, likely to fluid overload 4/21 nephrology started midodrine   10mg  before dialysis, BP appears to be doing a little better today 5/8- BP on low side in last 24 hours- likely made worse due to HD yesterday- denies OH Sx's. Con't to monitor trend 5/9- BP is running in 90's systolic- doesn't feel dizzy or lightheaded sitting, but could drop when standing-will d/w therapy 5/22- 5/25- 5/26- Drop in BP yesterday with standing to 90's systolic- felt dizzy- will on Midodrine - will wait to increase 5/27- 5/28tolerating therapy without drops in BP Vitals:   07/08/23 1630 07/08/23 1643 07/08/23 1700 07/08/23 1730  BP: 134/79 125/84 116/76 101/68   07/08/23 1800 07/08/23 1830 07/08/23 1948 07/08/23 2037  BP: 125/71 110/76 100/67 102/73   07/09/23 0512 07/09/23 1338 07/09/23 2024 07/10/23 0409  BP: 108/68 127/72 106/75 122/69     12. Constipation: Small BM on 04/14 --had of nulytey yesterday and another today? -Xray 4/17 without excessive stool, patient reports stool still little hard yesterday.  Will increase MiraLAX  to 3 times a day, continue Senokot twice a day. Suppository PRN   06/09/23 LBM yesterday, cont regimen 4/28- LBM overnight- has been refusing miralax - made it prn- taking metamucil 5/28- LBM yesterday 13. ?left knee effusion. Not currently bothersome. Consider imaging.  14.  SIRS- Fever/hypotension/tachycardia - 4/21 Currently afebrile, he got to 50 cc IVF yesterday, CXR negative, EKG sinus tachycardia. Patient was started on Maxipime  and vancomycin  yesterday.  Appears to be doing much better today.  WBC not elevated at 7.1.  Continue to follow blood cultures 4/22- Blood Cx's (-) so far- WBC 7.3- will con't IV ABX until know final results of blood Cx's- CXR (-)- but temp was 103.2 yesterday overnight.  4/23- Blood C'x (-) x2 days so far- T 99.9 this Am after a few sips of coffee- was 100.4 this AM- will con't IV ABX since concerned about possible bacteremia- esp since having low grade temps still 4/24- No more fevers, even low grade- no growth  on blood Cx' bu it says that didn't get enough blood, so much be falsely negative 4/25- No growth for 5 days, but it also said not enough blood to be sure was a true negative. Will change stop date til tomorrow -06/08/23 at 3am temp recorded as 100.3 but 5am was 98.3; will get CBC w/diff, monitor temps closely today, if continued low grade temps, will reach out to ID. BCx NGTD x5d. Didn't get vanc yesterday? Or cefepime ? Unclear why. Cefepime  started this morning at 0011. -06/09/23 temp 100.3 at 3am yesterday and then 100 later in the day, but no fevers since, no tachycardia.  -WBC yesterday 8.9.  -Now with some confusion; spoke with Dr. Levern Reader of ID, stated cefepime  could be the cause of the confusion -recommended we stop all abx (which ended yesterday anyway) -watch the patient OFF abx -if fevers >100.3 occur then she recommended repeat BCx and CXR and could call her back to see if any  abx would be advised at that time.  -Head CT and dopplers ordered as well, as above 4/28- CT looks good- no fever, WBC is OK- off ABX 4/29- Also Dopplers (-) feeling good this AM 06/16/23 seems to have resolved. 15. Tachycardia- resolved 4/22- doing better- right at 100 this AM- will monitor- likely can be from dregs of infection vs OH. -will monitor  4/23- Tachycardia improving- 4/24-27 HR in 90's right now- doing better  4/29- 4/30- HR running in 90s-low 100's today 5/12 heart rate in the 70s and 80s primarily, continue to monitor 5/20- HR back into low 100's- but is feeling good- denies Sx's.  5/22- back to running 80s-high 90's- will monitor trend    07/10/2023    4:09 AM 07/09/2023    8:24 PM 07/09/2023    1:38 PM  Vitals with BMI  Systolic 122 106 161  Diastolic 69 75 72  Pulse 80 81 81    16. R hand swelling  4/23- doing ice- will do xrays 4/24- Xray doesn't look like cause of swelling, however could be due to new shunt? 4/30- resolved 17. Hypocalcemia 4/24- his Albumin  is 2.1, so probably ok,  and phos coming down- will leave to renal- thank you for their help  5/1- Ca 9.3 5/8- Ca 8.8 5/15- Ca 8.5 18. Hyperkalemia 4/28- K+ 6.3 today- got hypoosmotic bath to reduce K+ per note from RN/HD nurse? Also has Na of 127- being addressed by HD/renal- Phos also 5.8- but again, getting HD today 5/1- K+ down to 5.6 and Na 129 5/6- Na 133 and K+ down to 4.7 from 5.6 5/7- refused labs this AM- wants in HD 5/9- K_ came back as 4.5 5/12 potassium elevated today, chronic issue has dialysis today.  Treatment per renal team 5/14- K+ down to 5.7- from 6.2- has HD today 06/29/23 down to 5.3 yesterday, had dialysis yesterday.  5/19- HD today 5/20- K+ stayed at 5.3- doing better 5/24- K+ 4.5 yesterday- best it's been 5/27- K+ 5.3- per renal 19. R>L knee pain 4/29- will ask Ortho if they will tap his R knee- I have no problem doing injection of knee, however with so much fluid, I would rather Ortho tap him and then do steroid injection.   4/30- we added Prednisone  40 mg daily x 3 days- to see if helps knee pain  5/1- mild reduction in pain so far 5/6- Pt has short term improvement from Prednisone  dosing for knees- con't percocet prn 5/7- since pt was doing so much worse with therapy, asked Ortho to come and tap knee and take off fluid- do steroid injection- they drew off 100CC- 5 different draws required- fluid is pending, but has a lot of WBCs- no organisms on gram stain.   5/8- no growth for 24 hours- R knee pain so much better  -06/22/23 NGTD x3 days, monitor  5/14- No growth  - pain so much better after 100cc fluid removed and got injection 20.  R hip pain 5/16- will get R hip xray- also c/w trochanteric bursitis on exam- if doesn't improve, will do trochanteric bursa injection Tuesday -06/29/23 R hip xray with cystic changes of femoral head/neck, also degeneration on my review- will need ortho f/up but doubt need for emergent consultation. Dr. Christiane Cowing is his primary ortho.  5/20- Dr Christiane Cowing wrote that he  has severe arthritis, but needs to be out of hospital to get hip replacement.    I spent a total of  37  minutes on  total care today- >50% coordination of care- due to  D/w pt at length about insurance and appeal and what this means for stay here, vs going home- my concern is he's done 4 steps, but not 18!   LOS: 41 days A FACE TO FACE EVALUATION WAS PERFORMED  Parminder Cupples 07/10/2023, 8:30 AM

## 2023-07-10 NOTE — Progress Notes (Signed)
   07/10/23 1756  Vitals  Temp 97.8 F (36.6 C)  Pulse Rate 96  Resp 15  BP 103/67  SpO2 100 %  Weight 84 kg  Type of Weight Post-Dialysis  Post Treatment  Dialyzer Clearance Lightly streaked  Hemodialysis Intake (mL) 0 mL  Liters Processed 84  Fluid Removed (mL) 4000 mL  Tolerated HD Treatment Yes  AVG/AVF Arterial Site Held (minutes) 8 minutes  AVG/AVF Venous Site Held (minutes) 8 minutes   Received patient in bed to unit.  Alert and oriented.  Informed consent signed and in chart.   TX duration:3.5hrs  Patient tolerated well.  Transported back to the room  Alert, without acute distress.  Hand-off given to patient's nurse.   Access used: RAVF Access issues: none  Total UF removed: 4L Medication(s) given: none   [facility]

## 2023-07-10 NOTE — Progress Notes (Signed)
 Patient ID: Carl Verno Sr., male   DOB: 1960/03/20, 63 y.o.   MRN: [id number]  Have not heard anything new from [employer] regarding his appeal. Will call once office opens. Encouraged pt to come up with a plan due to he is custodial care and not really skilled now since all of his progress made while here.

## 2023-07-10 NOTE — Plan of Care (Signed)
  Problem: Consults Goal: RH SPINAL CORD INJURY PATIENT EDUCATION Description:  See Patient Education module for education specifics.  Outcome: Progressing   Problem: RH SKIN INTEGRITY Goal: RH STG SKIN FREE OF INFECTION/BREAKDOWN Description: Manage skin free of infection/breakdown with minimal assistance Outcome: Progressing   Problem: RH SAFETY Goal: RH STG ADHERE TO SAFETY PRECAUTIONS W/ASSISTANCE/DEVICE Description: STG Adhere to Safety Precautions With minimal Assistance/Device. Outcome: Progressing   Problem: RH PAIN MANAGEMENT Goal: RH STG PAIN MANAGED AT OR BELOW PT'S PAIN GOAL Description: Pain<4 w/ prns Outcome: Progressing

## 2023-07-10 NOTE — Progress Notes (Signed)
 [facility] Progress Note   Subjective:   Pt reports feeling well, is awaiting appeal decision for SNF. Planned for HD today. No SOB or dizziness.   Objective Vitals:   07/09/23 0512 07/09/23 1338 07/09/23 2024 07/10/23 0409  BP: 108/68 127/72 106/75 122/69  Pulse: 91 81 81 80  Resp: 16 18 18 18   Temp: 98.1 F (36.7 C) 98.6 F (37 C) 98.7 F (37.1 C) 98.6 F (37 C)  TempSrc:   Oral Oral  SpO2: 100% 100% 100% 100%  Weight:      Height:       Physical Exam General: Alert male in NAD Lungs: Respirations unlabored on RA Abdomen: Non-distended Extremities: No edema b/l lower extremities Dialysis Access: RUE AVF + t/b  Additional Objective Labs: Basic Metabolic Panel: Recent Labs  Lab 07/04/23 0719 07/05/23 1332 07/08/23 1343  NA 133* 133* 131*  K 4.1 4.5 5.3*  CL 93* 95* 91*  CO2 25 25 21*  GLUCOSE 86 114* 110*  BUN 33* 58* 75*  CREATININE 5.91* 7.98* 9.65*  CALCIUM  8.6* 7.9* 8.2*  PHOS 3.9 5.0* 4.9*   Liver Function Tests: Recent Labs  Lab 07/04/23 0719 07/05/23 1332 07/08/23 1343  ALBUMIN  2.8* 2.6* 2.9*   No results for input(s): "LIPASE", "AMYLASE" in the last 168 hours. CBC: Recent Labs  Lab 07/04/23 0719 07/05/23 2107 07/08/23 1343  WBC 3.6* 4.8 5.5  HGB 9.5* 9.1* 9.4*  HCT 30.7* 29.3* 29.8*  MCV 89.5 88.3 89.0  PLT 262 237 270   Blood Culture    Component Value Date/Time   SDES FLUID 06/18/2023 1357   SPECREQUEST NONE 06/18/2023 1357   CULT  06/18/2023 1357    NO GROWTH 3 DAYS Performed at [facility], [address], [place], [place] [address]    REPTSTATUS 06/22/2023 FINAL 06/18/2023 1357    Cardiac Enzymes: No results for input(s): "CKTOTAL", "CKMB", "CKMBINDEX", "TROPONINI" in the last 168 hours. CBG: Recent Labs  Lab 07/03/23 1850  GLUCAP 147*   Iron Studies: No results for input(s): "IRON", "TIBC", "TRANSFERRIN", "FERRITIN" in the last 72 hours. @lablastinr3 @ Studies/Results: No results  found. Medications:   (feeding supplement) PROSource Plus  30 mL Oral BID BM   atorvastatin   10 mg Oral Q Thu   calcitRIOL   1.5 mcg Oral Q M,W,F-HD   carbamide peroxide  5 drop Right EAR BID   Chlorhexidine  Gluconate Cloth  6 each Topical Q0600   Chlorhexidine  Gluconate Cloth  6 each Topical Q0600   Chlorhexidine  Gluconate Cloth  6 each Topical Q0600   cinacalcet   120 mg Oral Q supper   darbepoetin (ARANESP ) injection - DIALYSIS  200 mcg Subcutaneous Q Tue-1800   gabapentin   300 mg Oral QHS   Gerhardt's butt cream   Topical BID   pantoprazole   40 mg Oral BID AC   psyllium  1 packet Oral TID   senna-docusate  2 tablet Oral BID   sevelamer  carbonate  3,200 mg Oral TID WC   sodium zirconium cyclosilicate   10 g Oral Daily   topiramate   25 mg Oral BID    Dialysis Orders: OP Dialysis Orders [facility] - MWF 4h  B400   85.5kg   2K bath  AVF   - Heparin  3000 units   - Mircera 60mcg IV q 2 weeks- last dose 05/24/23  - Calcitriol  1.5mcg PO q HD  Assessment/Plan: 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  Next HD today Hyperkalemia:Chronic issue.  On Lokelma  daily now. Continue to reinforce renal diet. HTN/volume: Tolerates high UF goals, 3.5-4L. Still over EDW. Attempt 4L again today  Anemia of ESRD:  Hgb 9.4. We have ordered to further increase to aranesp  200 mcg weekly on Tuesdays for next dose on 5/27. 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 Resumed calcitriol   Nutrition: continue protein supplements. Patient reports appetite is better  Ramona Burner, PA-C 07/10/2023, 10:12 AM  [facility] Pager: [phone]

## 2023-07-10 NOTE — Progress Notes (Signed)
 Occupational Therapy Session Note  Patient Details  Name: Carl Samples Sr. MRN: [id number] Date of Birth: Jan 20, 1961  Today's Date: 07/10/2023 OT Individual Time: 4098-1191 OT Individual Time Calculation (min): 60 min    Short Term Goals: Week 5:  OT Short Term Goal 1 (Week 5): Pt will complete toileting at [facility] with AE as necessary OT Short Term Goal 2 (Week 5): Pt will complete bathing at [facility] with AE as necessary  Skilled Therapeutic Interventions/Progress Updates:      Therapy Documentation Precautions:  Precautions Precautions: Fall, Cervical, Other (comment) Recall of Precautions/Restrictions: Impaired Precaution/Restrictions Comments: Verbally reviewed cervical precautions. Required Braces or Orthoses: Cervical Brace Cervical Brace: Soft collar, At all times Restrictions Weight Bearing Restrictions Per Provider Order: No General: " have to talk to my son" Pt seated in W/C upon OT arrival, agreeable to OT.  Pain: no pain reported  Other Treatments: Pt demonstrated community mobility with W/C in order to prepare for community integration at D/C. Pt propelled in PW/C requiring np rest breaks. Pt practices maneuvering into elevators and on various terrain,pulling into table tops, maneuvering around obstacles and small places such as gift shop. Pt able to reach BUE out of BOS with SBA. OT messaging PT about potential shower chair method for steps d/t increased stairs at D/C.    Pt seated in W/C at end of session with W/C alarm donned, call light within reach and 4Ps assessed.    Therapy/Group: Individual Therapy  [name], OTD, OTR/L 07/10/2023, 4:44 PM

## 2023-07-10 NOTE — Patient Care Conference (Signed)
 Inpatient RehabilitationTeam Conference and Plan of Care Update Date: 07/09/2023   Time: 1146 am    Patient Name: Carl Faircloth Sr.      Medical Record Number: [id number]  Date of Birth: 1960-06-12 Sex: Male         Room/Bed: 4W17C/4W17C-01 Payor Info: Payor: [employer]vertising [employer] MEDICARE / Plan: Sandford Croon DUAL COMPLETE / Product Type: *No Product type* /    Admit Date/Time:  05/30/2023  2:11 PM  Primary Diagnosis:  Spinal cord injury, cervical region, sequela [facility])  Hospital Problems: Principal Problem:   Spinal cord injury, cervical region, sequela (HCC) Active Problems:   Secondary hyperparathyroidism of renal origin (HCC)   Iron deficiency anemia, unspecified   Type 2 diabetes mellitus with diabetic peripheral angiopathy without gangrene (HCC)   Coping style affecting medical condition    Expected Discharge Date: Expected Discharge Date:  (pending appeal)  Team Members Present: Physician leading conference: Dr. Celia Coles Social Worker Present: Adrianna Albee, LCSW Nurse Present: Jerene Monks, RN PT Present: Aundria Leech, PT OT Present: [name], OT PPS Coordinator present : Jestine Moron, SLP     Current Status/Progress Goal Weekly Team Focus  Bowel/Bladder   Currently continent with B/B (HD pt oliguric)  LBM 07/08/23  Will retain B/B continence with normal pattern   Assist with toilet needs qshift/prn    Swallow/Nutrition/ Hydration               ADL's   SBA ADLs with AE as needed, still Min A to rise from lower surfaces without grab bar   Min A overall   D/C planning, sit to stands from low surfaces    Mobility   S bed mobility, CGA for sit <> stands from 24" height (min to mod for lower), CGA gait x 150', and min A stairs initiated   S bed mobility, CGA transfers bed <> w/c, minA sit < > stands, gait goal to min A, mod I PWC mobility  stair progression, sit <> stands from lower surfaces, strengthening, gait, dynamic balance,     Communication                Safety/Cognition/ Behavioral Observations               Pain   Endorses pain to right hip  on scale 8/10. Pain well managed with prn pain medication   Will endorses pain level <3   Assess pain qshift/prn and administer prn pain meds per order. Provide education on pharmacological/non pharmacological ways of reducing pain    Skin   Surgical incision to anterior neck (healed), scabbeb to posterior head(OTA), Fistula to right forearm   Skin will be free from infection  Assess skin for breakdown and provide education to prevent breakdown qshift/prn      Discharge Planning:  Pt's MD peer to peer wads denied and pt plans to appeal himself, will not go to SNF today. Alos encouraged him to look at options for home or to another fmaily members home until apartment ready 7/1    Team Discussion: Patient was admitted post Corpectomy with traumatic C4 SCI and on Hemodialysis..Patient limited by right hip pain and right knee pain.    Patient on target to meet rehab goals: yes, currently standby assist with ADLs using adaptive equipment and min assist to rise from lower surfaces without a grab bar. Patient requires CGA for sit to stand and able to ambulate 50' CGA and min assist with stairs. Overall goals at discharge are set  for min assist.   *See Care Plan and progress notes for long and short-term goals.   Revisions to Treatment Plan:  Rt knee tap/steroid injection Power wheelchair Slide board Family Conference Resting hand splint Built up handles for feeding and grooming C-Collar Head CT Dopplers W/C evaluation Ortho consult  Teaching Needs: Safety, medications, dietary modifications, safety, toileting and transfers, etc    Current Barriers to Discharge: Decreased caregiver support, Home enviroment access/layout, and Hemodialysis  Possible Resolutions to Barriers: Family Education SNF     Medical Summary Current Status: on ESRD- on HD-  Hb rising on Aranesp - LBM yesterday- appealing decision to go to SNF  Barriers to Discharge: Self-care education;Renal Insufficiency/Failure;Weight bearing restrictions;Uncontrolled Pain;Behavior/Mood  Barriers to Discharge Comments: limited by stairs- grip- and dispo- pain in knees and hips Possible Resolutions to [employer] Focus: making progress- walked 269.3 ft - has to stand from high surfaces- or grab bar- Supervision for ADLs- got brace for L knee- d/c soon- wants SNF   Continued Need for Acute Rehabilitation Level of Care: The patient requires daily medical management by a physician with specialized training in physical medicine and rehabilitation for the following reasons: Direction of a multidisciplinary physical rehabilitation program to maximize functional independence : Yes Medical management of patient stability for increased activity during participation in an intensive rehabilitation regime.: Yes Analysis of laboratory values and/or radiology reports with any subsequent need for medication adjustment and/or medical intervention. : Yes   I attest that I was present, lead the team conference, and concur with the assessment and plan of the team.   Dajia Gunnels Gayo 07/09/2023, 1146 am

## 2023-07-10 NOTE — Progress Notes (Signed)
 Physical Therapy Session Note  Patient Details  Name: Carl Cafaro Sr. MRN: [id number] Date of Birth: 1960/12/11  Today's Date: 07/10/2023 PT Individual Time: 0805-0900 and 1050-1205 PT Individual Time Calculation (min): 55 min and 75 min  Short Term Goals: Week 5:  PT Short Term Goal 1 (Week 5): = LTGS due to ELOS  Skilled Therapeutic Interventions/Progress Updates: Pt presented in PWC agreeable to therapy. Pt expresses some frustration regarding d/c situation however continues to demonstrate motivation to participate in therapy. Pt navigated mod I to day room and completed ambulatory transfer to mat with RW and CGA. Participated in modified crunches with 4lb weighted ball to fatigue (approx x40). PTA then exchanged Bari RW for standard RW, discussed differences between the two and pt completed stand and ambulated ~240ft with CGA. Participated in step ups 4in step with LLE x 12 for LLE strengthening to progress stairs. Pt also participated in standing ball bounces (taps to ground) with 2.5lb weights on wrists for standing balance challenge and UE activity. After seated rest pt ambulated back to room with RW and CGA and returned to [facility]. Pt left in PWC with call bell within reach and needs met.   Tx2: Pt presented in PWC agreeable to therapy. Discussed at length options of stair management. Discussed trial of shower chair method and pt open to option. Pt navigated mod I to main gym and PTA demonstrated shower chair method for stair management. Pt agreeable to try in larger stairwell due to pt's height. Pt navigated to stairwell and pt was able to complete stand step transfer to shower chair. Pt however was unable to power up to stand from lower level shower seat. Pt required minA to stand back at ground level and returned to [facility]. Pt was agreeable to work on stairs therefore returned to main gym where pt was able to ascend/descend x 12 steps with B rail and CGA. Pt performed with step to pattern and  significantly increased time.  PTA noted x 1 episode of R knee buckling however pt able to recover without assist. Discussed with pt continuing to pursue this option if appeal denied. Pt navigated to day room and worked on ambulation in obstacle course weaving through cones and stepping over thresholds with lighter RW and CGA overall. Pt did require intermittent cues for maintaining close proximity to RW when completing turns, however showed good safety when stepping over stick. Pt then worked on Sit to stand from [employer] which required minA on this date. Pt ambulated back to room and PTA took measurement of 24in bed height (goal). Pt left seated EOB at end of session with lunch tray in place, call bell within reach and needs met.      Therapy Documentation Precautions:  Precautions Precautions: Fall, Cervical, Other (comment) Recall of Precautions/Restrictions: Impaired Precaution/Restrictions Comments: Verbally reviewed cervical precautions. Required Braces or Orthoses: Cervical Brace Cervical Brace: Soft collar, At all times Restrictions Weight Bearing Restrictions Per Provider Order: No General:   Vital Signs: Therapy Vitals Temp: 98.1 F (36.7 C) Pulse Rate: 73 Resp: 12 BP: 90/70 Oxygen Therapy SpO2: 100 % O2 Device: Room Air Pain: Pain Assessment Pain Scale: 0-10 Pain Score: 0-No pain Mobility:   Locomotion :    Trunk/Postural Assessment :    Balance:   Exercises:   Other Treatments:      Therapy/Group: Individual Therapy  Sabrinna Yearwood 07/10/2023, 4:44 PM

## 2023-07-11 MED ORDER — CHLORHEXIDINE GLUCONATE CLOTH 2 % EX PADS
6.0000 | MEDICATED_PAD | Freq: Every day | CUTANEOUS | Status: DC
Start: 2023-07-12 — End: 2023-07-15
  Administered 2023-07-15: 6 via TOPICAL

## 2023-07-11 NOTE — Group Note (Signed)
 Patient Details Name: Carl Ake Sr. MRN: [id number] DOB: May 04, 1960 Today's Date: 07/11/2023  Time Calculation: OT Group Time Calculation OT Group Start Time: 1430 OT Group Stop Time: 1530 OT Group Time Calculation (min): 60 min      Group Description: Dance Group: Pt participated in dance group with an emphasis on social interaction, motor planning, increasing overall activity tolerance and bimanual tasks. All songs were selected by group members. Dance moves included AROM of BUE/BLE gross motor movements with an emphasis on building functional endurance.    Individual level documentation: Patient completed group from sitting level. Patient needed supervision to complete various dance moves with OT providing visual model.  Patient able to create his own modifications during group.  Pain:  0/10  Precautions:  Falls  [name] 07/11/2023, 3:43 PM

## 2023-07-11 NOTE — Progress Notes (Signed)
 PROGRESS NOTE   Subjective/Complaints:  Pt reports severe pain in R hip last night aroun d1:30am-  BM's daily Also upset wasn't able ot have [place] sausage this AM- not sure why ROS: - diet hasn't changed   Pt denies SOB, abd pain, CP, N/V/C/D, and vision changes  B/L R>L knee pain (+)- R knee very improved +R hip pain-managed but severe arthritis  Objective:   No results found.      Recent Labs    07/08/23 1343 07/10/23 1351  WBC 5.5 5.7  HGB 9.4* 9.3*  HCT 29.8* 29.7*  PLT 270 237     Recent Labs    07/08/23 1343 07/10/23 1351  NA 131* 131*  K 5.3* 5.1  CL 91* 93*  CO2 21* 22  GLUCOSE 110* 119*  BUN 75* 67*  CREATININE 9.65* 9.42*  CALCIUM  8.2* 8.3*     Intake/Output Summary (Last 24 hours) at 07/11/2023 1053 Last data filed at 07/11/2023 0833 Gross per 24 hour  Intake 480 ml  Output 4000 ml  Net -3520 ml        Physical Exam: Vital Signs Blood pressure 115/70, pulse 91, temperature 98.6 F (37 C), resp. rate 19, height 6\' 1"  (1.854 m), weight 83.1 kg, SpO2 100%.     General: awake, alert, appropriate, sitting up in w/c today;  NAD HENT: conjugate gaze; oropharynx moist CV: regular rate; no JVD Pulmonary: CTA B/L; no W/R/R- good air movement GI: soft, NT, ND, (+)BS Psychiatric: appropriate- bright affect Neurological: Ox3   MSK: cannot make fist at all on L hand-  and can make fist on R hand, but only for 3 seconds.   PRIOR EXAMS:MSK- R lateral and groin TTP- c/w trochanteric bursitis and R joint pain  Neurological: Ox3, follows commands, cranial nerves II through XII grossly intact,   MSK- R knee much less swollen MsK: B/L knees with effusions- less TTP on R knee this AM- looks good- mild effusion , no erythema, not really focally warm to touch- Both have effusions, - R hand swelling looks good/resolved  MSK- R hand swelling esp ulnar side of hand/dorsum and L wrist-stable  this AM RUE- Biceps 4/5; WE 4-/5; Triceps 2/5; Grip 2-/5 and FA 1/5 LUE- 4/5 except triceps 3+ to 4-/5 RLE- HF 1 to 2-/5; KE 2-/5; DF/PF 2-/5 LLE- HF 3/5; KE 4-/5; DF/PF 4/5    Skin: C/D/I. Multiple lesions on BL legs with various stages of healing.  + RUE fistula - intact   MSK:      LLE knee with palpable effusion, no warmth, no ttp Mood: Pleasant affect, appropriate mood.  Neurologic exam: Alert and oriented x 4, fluent, follows commands, cranial nerves II through XII grossly intact, no hypertonia noted  Sensation: Reduced to light touch in distal right lower extremity to the mid-thigh       Strength:                RUE: 2/5 SA, 2/5 EF, 2/5 EE, 2/5 WE, 2/5 FF, 2/5 FA                LUE:  3/5 SA, 3/5 EF, 3/5 EE, 3/5 WE, 3/5 FF,  3/5 FA                RLE: 2/5 HF, 2/5 KE, 2/5  DF, 2/5  EHL, 2/5  PF                 LLE:  3/5 HF, 4/5 KE, 4/5  DF, 4/5  EHL, 4/5  PF  Neuroexam stable 4/21  Assessment/Plan: 1. Functional deficits which require 3+ hours per day of interdisciplinary therapy in a comprehensive inpatient rehab setting. Physiatrist is providing close team supervision and 24 hour management of active medical problems listed below. Physiatrist and rehab team continue to assess barriers to discharge/monitor patient progress toward functional and medical goals  Care Tool:  Bathing    Body parts bathed by patient: Right arm, Left arm, Chest, Abdomen, Front perineal area, Buttocks, Face, Left lower leg, Right lower leg, Left upper leg, Right upper leg   Body parts bathed by helper: Left arm, Front perineal area, Buttocks, Right lower leg, Left lower leg     Bathing assist Assist Level: Supervision/Verbal cueing     Upper Body Dressing/Undressing Upper body dressing   What is the patient wearing?: Pull over shirt    Upper body assist Assist Level: Independent with assistive device    Lower Body Dressing/Undressing Lower body dressing      What is the patient  wearing?: Pants, Underwear/pull up     Lower body assist Assist for lower body dressing: Supervision/Verbal cueing     Toileting Toileting Toileting Activity did not occur (Clothing management and hygiene only): N/A (no void or bm) (anuric, HD patient)  Toileting assist Assist for toileting: Supervision/Verbal cueing     Transfers Chair/bed transfer  Transfers assist     Chair/bed transfer assist level: Contact Guard/Touching assist     Locomotion Ambulation   Ambulation assist   Ambulation activity did not occur: Safety/medical concerns  Assist level: Contact Guard/Touching assist Assistive device: Walker-rolling Max distance: 150   Walk 10 feet activity   Assist  Walk 10 feet activity did not occur: Safety/medical concerns  Assist level: Contact Guard/Touching assist Assistive device: Walker-rolling   Walk 50 feet activity   Assist Walk 50 feet with 2 turns activity did not occur: Safety/medical concerns  Assist level: Contact Guard/Touching assist Assistive device: Walker-rolling    Walk 150 feet activity   Assist Walk 150 feet activity did not occur: Safety/medical concerns  Assist level: Contact Guard/Touching assist Assistive device: Walker-rolling    Walk 10 feet on uneven surface  activity   Assist Walk 10 feet on uneven surfaces activity did not occur: Safety/medical concerns   Assist level: Contact Guard/Touching assist Assistive device: Walker-rolling   Wheelchair     Assist Is the patient using a wheelchair?: Yes Type of Wheelchair: Power    Wheelchair assist level: Independent Max wheelchair distance: >500    Wheelchair 50 feet with 2 turns activity    Assist        Assist Level: Independent   Wheelchair 150 feet activity     Assist      Assist Level: Independent   Blood pressure 115/70, pulse 91, temperature 98.6 F (37 C), resp. rate 19, height 6\' 1"  (1.854 m), weight 83.1 kg, SpO2  100%.  Medical Problem List and Plan: 1. 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  Antithrombotics: -DVT/anticoagulation:  Mechanical: Sequential compression devices, below knee Bilateral lower extremities             -antiplatelet therapy: N/A--no antiplatelet due to hx of recurrent GIB  4/25- d/w family- they agree that Lovenox  not appropriate at this time.   -06/09/23 ordered dopplers to r/o clots given low grade temps recently  4/28- LE Dopplers (-) for DVT 3. Pain Management:  oxycodone  prn. Gabapentin  300 mg/hs.              --has Flexeril  10 mg TID prn              - patient with intermittent, severe b/l headache - add topomax 25 mg BID    4/19- pt rating pain 07/19/08 which is "tolerable" 5/7- got Ortho to come and take fluid off R knee- got 100cc off- pain is no more than 1/10 this AM! 5/8-5/9 knee still doing well 5/13-5/14-  pain controlled- allowed him to walk further yesterday in therapy- still cannot do more than 1 step 5/16- will get R hip xray due to clunk and R hi pain -06/29/23 R hip xray with cystic changes of femoral head/neck, also degeneration on my review- will need ortho f/up but doubt need for emergent consultation. Dr. Christiane Cowing is his primary ortho 5/20- Pt's Ortho responded and knows about Deg arthritis in R hip- but cannot address while in hospital  5/26- no change on R hip pain 5/27- still bad and now L knee buckling- placed L knee brace 4. Mood/Behavior/Sleep: LCSW to follow for evaluation and support             -antipsychotic agents: N/A             --melatonin prn for insomnia.  -06/09/23 poor  sleep, d/t interruptions, asked nursing to minimize interruptions between 11p-5a-- sleeping better lately 5. Neuropsych/cognition: This patient is capable of making decisions on his own behalf. 5/8- Ox3 and appropriate 6. Skin/Wound Care: Routine pressure relief measures.   4/19- due to ulcers on legs, on low air loss mattress 7. Fluids/Electrolytes/Nutrition: Strict I/O. Daily weights. Labs with HD. 8. ESRD- HD MWF: Schedule HD at the end of the day to help with tolerance of therapy             --Sensipar  and Renvela  for metabolic bone disease.   - Patient reports no longer makes urine  5/13 - last HD yesterday 5/15- last HD yesterday=- they were not able to finish due to equipment issues- will need to monitor fluid intake today- so can make it til tomorrow- they are not doing more today.  5/28- HD today 9. Anemia of chronic disease: Monitor H/H, aranesp  5/9- Hb 7.6 in last 24 hours- done yesterday- feels better- no dizziness or fatigue- so doesn't feel transfusion is appropriate 5/12 HGB up to 8.1 , improved continue to monitor.  He is on Aranesp  per nephrology 5/15- pt needs to have CBC rechecked in HD tomorrow-  -06/29/23 Hgb 7.8, stable. Monitor.  5/20- Hb 8.0 5/21- refused labs this AM since getting in HD- overall labs looking better 2 days ago 5/22- Hb 9.5 this AM!! 5/28-  Labs today? HD today 5/29- Hb 9.3- stable 10. H/o recurrent GIB/J[employer]ovah's Witness- will only take blood products in absolute emergency per Renal note: Continue Protonix  BID 11. H/o Hypotension: Has had drop  in BP with HD. Now cervical SCI.  --Monitor for symptoms day after HD and with activity. -4/18 baseline blood pressure stable, monitor with activity 4/19- BP actually slightly elevated today- 140s-150's- con't regimen, since can drop with HD and therapy 4/20- BP actually doing better today- in 100s'110s- however HR elevated, likely to fluid overload 4/21 nephrology started midodrine  10mg  before dialysis, BP  appears to be doing a little better today 5/8- BP on low side in last 24 hours- likely made worse due to HD yesterday- denies OH Sx's. Con't to monitor trend 5/9- BP is running in 90's systolic- doesn't feel dizzy or lightheaded sitting, but could drop when standing-will d/w therapy 5/22- 5/25- 5/26- Drop in BP yesterday with standing to 90's systolic- felt dizzy- will on Midodrine - will wait to increase 5/27- 5/29- tolerating therapy without drops in BP Vitals:   07/10/23 1500 07/10/23 1530 07/10/23 1600 07/10/23 1630  BP: 108/72 125/86 94/65 90/70    07/10/23 1645 07/10/23 1700 07/10/23 1730 07/10/23 1746  BP: 103/70 94/71 98/70  (!) 81/50   07/10/23 1756 07/10/23 1859 07/10/23 2031 07/11/23 0340  BP: 103/67 122/74 95/64 115/70     12. Constipation: Small BM on 04/14 --had of nulytey yesterday and another today? -Xray 4/17 without excessive stool, patient reports stool still little hard yesterday.  Will increase MiraLAX  to 3 times a day, continue Senokot twice a day. Suppository PRN   06/09/23 LBM yesterday, cont regimen 4/28- LBM overnight- has been refusing miralax - made it prn- taking metamucil 5/28- LBM yesterday 13. ?left knee effusion. Not currently bothersome. Consider imaging.  14.  SIRS- Fever/hypotension/tachycardia - 4/21 Currently afebrile, he got to 50 cc IVF yesterday, CXR negative, EKG sinus tachycardia. Patient was started on Maxipime  and vancomycin  yesterday.  Appears to be doing much better today.  WBC not elevated at 7.1.  Continue to follow blood cultures 4/22- Blood Cx's (-) so far- WBC 7.3- will con't IV ABX until know final results of blood Cx's- CXR (-)- but temp was 103.2 yesterday overnight.  4/23- Blood C'x (-) x2 days so far- T 99.9 this Am after a few sips of coffee- was 100.4 this AM- will con't IV ABX since concerned about possible bacteremia- esp since having low grade temps still 4/24- No more fevers, even low grade- no growth on blood Cx' bu it says  that didn't get enough blood, so much be falsely negative 4/25- No growth for 5 days, but it also said not enough blood to be sure was a true negative. Will change stop date til tomorrow -06/08/23 at 3am temp recorded as 100.3 but 5am was 98.3; will get CBC w/diff, monitor temps closely today, if continued low grade temps, will reach out to ID. BCx NGTD x5d. Didn't get vanc yesterday? Or cefepime ? Unclear why. Cefepime  started this morning at 0011. -06/09/23 temp 100.3 at 3am yesterday and then 100 later in the day, but no fevers since, no tachycardia.  -WBC yesterday 8.9.  -Now with some confusion; spoke with Dr. Levern Reader of ID, stated cefepime  could be the cause of the confusion -recommended we stop all abx (which ended yesterday anyway) -watch the patient OFF abx -if fevers >100.3 occur then she recommended repeat BCx and CXR and could call her back to see if any abx would be advised  at that time.  -Head CT and dopplers ordered as well, as above 4/28- CT looks good- no fever, WBC is OK- off ABX 4/29- Also Dopplers (-) feeling good this AM 06/16/23 seems to have resolved. 15. Tachycardia- resolved 4/22- doing better- right at 100 this AM- will monitor- likely can be from dregs of infection vs OH. -will monitor  4/23- Tachycardia improving- 4/24-27 HR in 90's right now- doing better  4/29- 4/30- HR running in 90s-low 100's today 5/12 heart rate in the 70s and 80s primarily, continue to monitor 5/20- HR back into low 100's- but is feeling good- denies Sx's.  5/22- back to running 80s-high 90's- will monitor trend 5/29- HR in 80s-90's in last few days- doing better    07/11/2023    6:25 AM 07/11/2023    3:40 AM 07/10/2023    8:31 PM  Vitals with BMI  Weight 183 lbs 3 oz    Systolic  115 95  Diastolic  70 64  Pulse  91     16. R hand swelling  4/23- doing ice- will do xrays 4/24- Xray doesn't look like cause of swelling, however could be due to new shunt? 4/30- resolved 17.  Hypocalcemia 4/24- his Albumin  is 2.1, so probably ok, and phos coming down- will leave to renal- thank you for their help  5/1- Ca 9.3 5/8- Ca 8.8 5/15- Ca 8.5 18. Hyperkalemia 4/28- K+ 6.3 today- got hypoosmotic bath to reduce K+ per note from RN/HD nurse? Also has Na of 127- being addressed by HD/renal- Phos also 5.8- but again, getting HD today 5/1- K+ down to 5.6 and Na 129 5/6- Na 133 and K+ down to 4.7 from 5.6 5/7- refused labs this AM- wants in HD 5/9- K_ came back as 4.5 5/12 potassium elevated today, chronic issue has dialysis today.  Treatment per renal team 5/14- K+ down to 5.7- from 6.2- has HD today 06/29/23 down to 5.3 yesterday, had dialysis yesterday.  5/19- HD today 5/20- K+ stayed at 5.3- doing better 5/24- K+ 4.5 yesterday- best it's been 5/27- K+ 5.3- per renal 5/29- K+ 5.1 with HD yesterday 19. R>L knee pain 4/29- will ask Ortho if they will tap his R knee- I have no problem doing injection of knee, however with so much fluid, I would rather Ortho tap him and then do steroid injection.   4/30- we added Prednisone  40 mg daily x 3 days- to see if helps knee pain  5/1- mild reduction in pain so far 5/6- Pt has short term improvement from Prednisone  dosing for knees- con't percocet prn 5/7- since pt was doing so much worse with therapy, asked Ortho to come and tap knee and take off fluid- do steroid injection- they drew off 100CC- 5 different draws required- fluid is pending, but has a lot of WBCs- no organisms on gram stain.   5/8- no growth for 24 hours- R knee pain so much better  -06/22/23 NGTD x3 days, monitor  5/14- No growth  - pain so much better after 100cc fluid removed and got injection 20.  R hip pain 5/16- will get R hip xray- also c/w trochanteric bursitis on exam- if doesn't improve, will do trochanteric bursa injection Tuesday -06/29/23 R hip xray with cystic changes of femoral head/neck, also degeneration on my review- will need ortho f/up but doubt  need for emergent consultation. Dr. Christiane Cowing is his primary ortho.  5/20- Dr Christiane Cowing wrote that he has severe arthritis, but needs to be  out of hospital to get hip replacement.  5/29- had severe pain in R hip last night at 1:30am- is better after pain meds    LOS: 42 days A FACE TO FACE EVALUATION WAS PERFORMED  Stanton Kissoon 07/11/2023, 10:53 AM

## 2023-07-11 NOTE — Progress Notes (Signed)
 Physical Therapy Session Note  Patient Details  Name: [name]. MRN: [id number] Date of Birth: Jul 03, 1960  Today's Date: 07/11/2023 PT Individual Time: 0830-0930 PT Individual Time Calculation (min): 60 min   Short Term Goals: Week 5:  PT Short Term Goal 1 (Week 5): = LTGS due to ELOS Week 6:  PT Short Term Goal 1 (Week 6): = LTGs  Skilled Therapeutic Interventions/Progress Updates:    Pt reports still awaiting decision from insurance for approval for SNF. Focused on preparation for home if approval does not go through including issuing handout for home measurements for height of surfaces as pt still requires increased assist and difficulty with sit > stands from surfaces lower than 24". Discussed equipment - pt reports having a RW and a manual w/c (pt would not be able to transfer out of a manual w/c without physical A or propel w/c due to UE limitations). PWC would not be able to access his home, so primarily would be using RW for all mobility. Discussed stair set up (18 steps), pt thinks he can reach both rails (asked for measurements) and discussed overall energy expenditure for distances from car, to stairwell and then to apartment on HD days (3 x week) as would require a family member/friend to assist with stairs and transportation to/from HD. Pt performed 4 step x 5 reps (20 total steps) at 6" height with extra time and CGA, occasional tactile input needed at RLE especially during eccentric control. Discussed HD chair, pt reports a lower recliner. Practiced with recliner in patient room (and added 2 pillows as pt thought it might not be that low). Initial attempt required max A for sit > stand with RW and then second trial with mod A to facilitate anterior weightshift with improved technique. Pt would require physical assist for transfer in/out of HD chair at outpatient setting due to lower surface. In standing, pt calling HD center as well as insurance company to check up on appeal ~  2-3 min intermittent UE support and some without UE support, LOB posteriorly into the recliner. Transferred back to PWC at end of session with mod for sit > stand from recliner and then close S to CGA for gait in room with RW back to PWC. Pt manages PWC functions independently.  Therapy Documentation Precautions:  Precautions Precautions: Fall, Cervical, Other (comment) Recall of Precautions/Restrictions: Impaired Precaution/Restrictions Comments: Verbally reviewed cervical precautions. Required Braces or Orthoses: Cervical Brace Cervical Brace: Soft collar, At all times Restrictions Weight Bearing Restrictions Per Provider Order: No  Pain: Denies pain. States he had some pain overnight and had pain medication.    Therapy/Group: Individual Therapy  Gita Lamb Amadeo June, PT, DPT, CBIS  07/11/2023, 9:33 AM

## 2023-07-11 NOTE — Plan of Care (Signed)
  Problem: RH Stairs Goal: LTG Patient will ambulate up and down stairs w/assist (PT) Description: LTG: Patient will ambulate up and down # of stairs with assistance (PT) 07/11/2023 0941 by Ouida Bloom, PT Flowsheets Taken 07/11/2023 [phone] by Gita Lamb B, PT LTG: Pt will ambulate up/down stairs assist needed:: Contact Guard/Touching assist Taken 05/31/2023 1254 by Aundria Leech C, PT LTG: Pt will  ambulate up and down number of stairs: 18 per home environment Note: Added goal back due to progress 07/11/2023 0941 by Ouida Bloom, PT Reactivated

## 2023-07-11 NOTE — Progress Notes (Addendum)
 Patient ID: Carl Mcgroarty Sr., male   DOB: May 14, 1960, 63 y.o.   MRN: [id number] Continue to await the decision regarding his appeal. He has 72 hours for a determination to be made. PT working on home measurements and getting in and out of HD chair. Pt aware if denied will need to go home or to another family members home  10:30 Am Spoke with sister via telephone to explain criteria for continued stay and appeal for SNF. Discussed needing a plan B in case appeal is denied. Have contacted [employer] to make sure received the documentation and they have and have 72 hr for determination

## 2023-07-11 NOTE — Progress Notes (Signed)
 Recreational Therapy Session Note  Patient Details  Name: Carl Crescenzo Sr. MRN: [id number] Date of Birth: 08/27/1960 Today's Date: 07/11/2023  Pain: no c/o  Goal:  Pt will actively participate in 60 minutes therapeutic dance group with supervision.  MET  Group Description: Dance Group: Pt participated in dance group with an emphasis on social interaction, motor planning, increasing overall activity tolerance and bimanual tasks. All songs were selected by group members. Dance moves included AROM of BUE/BLE gross motor movements with an emphasis on building functional endurance.      Individual level documentation: Patient completed group from sitting level. Patient needed supervision to complete various dance moves with with demonstration/cues for group leaders. Patient able to create his own modifications during group. Laverta Harnisch 07/11/2023, 3:49 PM

## 2023-07-11 NOTE — Progress Notes (Signed)
 [facility] Progress Note   Subjective:   Still waiting on appeal decision. Not happy with his diet this AM but otherwise no concerns. Denies SOB, CP, dizziness, nausea.   Objective Vitals:   07/10/23 1859 07/10/23 2031 07/11/23 0340 07/11/23 0625  BP: 122/74 95/64 115/70   Pulse: 93  91   Resp: 16 18 19    Temp: 98.4 F (36.9 C) 99.2 F (37.3 C) 98.6 F (37 C)   TempSrc: Oral     SpO2: 100% 100% 100%   Weight:    83.1 kg  Height:       Physical Exam General: Alert male in NAD Lungs: Respirations unlabored on RA Abdomen: Non-distended Extremities: No edema b/l lower extremities Dialysis Access: RUE AVF + t/b  Additional Objective Labs: Basic Metabolic Panel: Recent Labs  Lab 07/05/23 1332 07/08/23 1343 07/10/23 1351  NA 133* 131* 131*  K 4.5 5.3* 5.1  CL 95* 91* 93*  CO2 25 21* 22  GLUCOSE 114* 110* 119*  BUN 58* 75* 67*  CREATININE 7.98* 9.65* 9.42*  CALCIUM  7.9* 8.2* 8.3*  PHOS 5.0* 4.9* 4.9*   Liver Function Tests: Recent Labs  Lab 07/05/23 1332 07/08/23 1343 07/10/23 1351  ALBUMIN  2.6* 2.9* 2.8*   No results for input(s): "LIPASE", "AMYLASE" in the last 168 hours. CBC: Recent Labs  Lab 07/05/23 2107 07/08/23 1343 07/10/23 1351  WBC 4.8 5.5 5.7  HGB 9.1* 9.4* 9.3*  HCT 29.3* 29.8* 29.7*  MCV 88.3 89.0 89.2  PLT 237 270 237   Blood Culture    Component Value Date/Time   SDES FLUID 06/18/2023 1357   SPECREQUEST NONE 06/18/2023 1357   CULT  06/18/2023 1357    NO GROWTH 3 DAYS Performed at [facility], [address], [place], [place] [address]    REPTSTATUS 06/22/2023 FINAL 06/18/2023 1357    Cardiac Enzymes: No results for input(s): "CKTOTAL", "CKMB", "CKMBINDEX", "TROPONINI" in the last 168 hours. CBG: No results for input(s): "GLUCAP" in the last 168 hours. Iron Studies: No results for input(s): "IRON", "TIBC", "TRANSFERRIN", "FERRITIN" in the last 72 hours. @lablastinr3 @ Studies/Results: No results  found. Medications:   (feeding supplement) PROSource Plus  30 mL Oral BID BM   atorvastatin   10 mg Oral Q Thu   calcitRIOL   1.5 mcg Oral Q M,W,F-HD   carbamide peroxide  5 drop Right EAR BID   Chlorhexidine  Gluconate Cloth  6 each Topical Q0600   Chlorhexidine  Gluconate Cloth  6 each Topical Q0600   Chlorhexidine  Gluconate Cloth  6 each Topical Q0600   cinacalcet   120 mg Oral Q supper   darbepoetin (ARANESP ) injection - DIALYSIS  200 mcg Subcutaneous Q Tue-1800   gabapentin   300 mg Oral QHS   Gerhardt's butt cream   Topical BID   pantoprazole   40 mg Oral BID AC   psyllium  1 packet Oral TID   senna-docusate  2 tablet Oral BID   sevelamer  carbonate  3,200 mg Oral TID WC   sodium zirconium cyclosilicate   10 g Oral Daily   topiramate   25 mg Oral BID    Dialysis Orders: OP Dialysis Orders [facility] - MWF 4h  B400   85.5kg   2K bath  AVF   - Heparin  3000 units   - Mircera 60mcg IV q 2 weeks- last dose 05/24/23  - Calcitriol  1.5mcg PO q HD  Assessment/Plan: 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  Next HD tomorrow. No issues with HD Hyperkalemia:Chronic issue.  On Lokelma  daily now. Continue to reinforce renal diet. HTN/volume: Tolerates high UF goals, 3.5-4L. Will need lower EDW at discharge Anemia of ESRD:  Hgb 9.3. We have ordered to further increase to aranesp  200 mcg weekly on Tuesdays  Secondary HPTH: Calcium  and phos at goal. Continue sevelamer  and sensipar . Resumed calcitriol   Nutrition: continue protein supplements. Patient reports appetite is better    Ramona Burner, PA-C 07/11/2023, 12:21 PM  [facility] Pager: [phone]

## 2023-07-11 NOTE — Progress Notes (Signed)
 Physical Therapy Session Note  Patient Details  Name: Carl Pfalzgraf Sr. MRN: [id number] Date of Birth: 10-Jun-1960  Today's Date: 07/11/2023 PT Individual Time: 1400-1430 PT Individual Time Calculation (min): 30 min   Short Term Goals: Week 6:  PT Short Term Goal 1 (Week 6): = LTGs  Skilled Therapeutic Interventions/Progress Updates:    Session focused on functional transfers from lower surfaces for strengthening, balance and carryover to other environments as well as NMR on Nustep for reciprocal movement pattern retraining, strength and endurance (paced program with target goal of 80 steps per min). Pt performed basic transfer with RW from elevated surface with CGA but requires heavier mod A from lower surface for facilitation of anterior weightshift and power up. CGA once up in standing but slight instability in knees during transfer this PM - pt relates to fatigue. Handoff to OT for Dance Group. Pt performed PWC mobility and management at mod I level.  Pt also asking about cervical soft collar still needing to be on. Message send to medical team for follow up.    Therapy Documentation Precautions:  Precautions Precautions: Fall, Cervical, Other (comment) Recall of Precautions/Restrictions: Impaired Precaution/Restrictions Comments: Verbally reviewed cervical precautions. Required Braces or Orthoses: Cervical Brace Cervical Brace: Soft collar, At all times Restrictions Weight Bearing Restrictions Per Provider Order: No    Pain:  Denies pain    Therapy/Group: Individual Therapy  Gita Lamb Amadeo June, PT, DPT, CBIS  07/11/2023, 2:40 PM

## 2023-07-11 NOTE — Progress Notes (Signed)
 Occupational Therapy Weekly Progress Note  Patient Details  Name: Carl Leite Sr. MRN: [id number] Date of Birth: [birth year]  Beginning of progress report period: Jul 01, 2023 End of progress report period: Jul 11, 2023  Today's Date: 07/11/2023 OT Individual Time: 0955-1050 OT Individual Time Calculation (min): 55 min    Patient has met 2 of 2 short term goals.  Pt with significant progress toward ADLs and functional mobility with AD. Pt still demonstrating difficulty rising from low surfaces. Pt BUE FMC increasing too, demonstrating at least 20 second difference in scoring with 9HPT for BUE. Pt attempting to appeal denial for SNF for further therapy/nsg care d/t inaccessible home environment.   Patient continues to demonstrate the following deficits: muscle weakness, decreased cardiorespiratoy endurance, and decreased standing balance and decreased balance strategies and therefore will continue to benefit from skilled OT intervention to enhance overall performance with BADL, iADL, and Reduce care partner burden.  Patient progressing toward long term goals..  Continue plan of care.  OT Short Term Goals Week 5:  OT Short Term Goal 1 (Week 5): Pt will complete toileting at Min A with AE as necessary OT Short Term Goal 1 - Progress (Week 5): Met OT Short Term Goal 2 (Week 5): Pt will complete bathing at [facility] with AE as necessary OT Short Term Goal 2 - Progress (Week 5): Met Week 6:  OT Short Term Goal 1 (Week 6): Pt will don footwear SBA OT Short Term Goal 2 (Week 6): Pt will complete sit to stand from 23" surface with CGA with LRAD in preparation for ADLs OT Short Term Goal 3 (Week 6): Pt will complete dynamic standing task at SBA with LRAD  Skilled Therapeutic Interventions/Progress Updates:      Therapy Documentation Precautions:  Precautions Precautions: Fall, Cervical, Other (comment) Recall of Precautions/Restrictions: Impaired Precaution/Restrictions Comments: Verbally  reviewed cervical precautions. Required Braces or Orthoses: Cervical Brace Cervical Brace: Soft collar, At all times Restrictions Weight Bearing Restrictions Per Provider Order: No General: "I will be going to dance group later!" Pt seated in commode with NT upon OT arrival, agreeable to OT.  Pain:  3/10 pain reported in Rt hip, activity, intermittent rest breaks, distractions provided for pain management, pt reports tolerable to proceed.    ADL:OT providing skilled intervention on ADL retraining in order to increase independence with tasks and increase activity tolerance. Pt completed the following tasks at the current level of assist: Toilet transfer: SBA with RW ambulating from toilet>W?C ~10 ft with RW Toileting: Grooming: pt standing without BUE support CGA at sink to wash hands without LOB/SOB Transfers:   Balance: OT providing skilled intervention with a variety of standing activities in order to promote increased balance strategies with ADL participation. Pt completed all activities at standing level  with intermittent supported/unsupported standing at RW. Pt completed maneuvering items of various weights (no more than 2#) on/off shelves of various heights and floor. Pt ambulating with RW at SBA to complete task with no LOB/SOB. Pt able to stand for increased time during session ~15 min with no rest break.   Pt seated in W/C at end of session with W/C alarm donned, call light within reach and 4Ps assessed.    Therapy/Group: Individual Therapy  [name], OTD, OTR/L 07/11/2023, 4:47 PM

## 2023-07-11 NOTE — Plan of Care (Signed)
  Problem: SCI BOWEL ELIMINATION Goal: RH STG MANAGE BOWEL WITH ASSISTANCE Description: STG Manage Bowel with minimal Assistance. Outcome: Progressing   Problem: RH SKIN INTEGRITY Goal: RH STG SKIN FREE OF INFECTION/BREAKDOWN Description: Manage skin free of infection/breakdown with minimal assistance Outcome: Progressing   Problem: RH SAFETY Goal: RH STG ADHERE TO SAFETY PRECAUTIONS W/ASSISTANCE/DEVICE Description: STG Adhere to Safety Precautions With minimal Assistance/Device. Outcome: Progressing   Problem: RH PAIN MANAGEMENT Goal: RH STG PAIN MANAGED AT OR BELOW PT'S PAIN GOAL Description: Pain<4 w/ prns Outcome: Progressing

## 2023-07-12 DIAGNOSIS — M25569 Pain in unspecified knee: Secondary | ICD-10-CM

## 2023-07-12 LAB — RENAL FUNCTION PANEL
Albumin: 2.8 g/dL — ABNORMAL LOW (ref 3.5–5.0)
Anion gap: 17 — ABNORMAL HIGH (ref 5–15)
BUN: 60 mg/dL — ABNORMAL HIGH (ref 8–23)
CO2: 22 mmol/L (ref 22–32)
Calcium: 8.2 mg/dL — ABNORMAL LOW (ref 8.9–10.3)
Chloride: 89 mmol/L — ABNORMAL LOW (ref 98–111)
Creatinine, Ser: 8.57 mg/dL — ABNORMAL HIGH (ref 0.61–1.24)
GFR, Estimated: 6 mL/min — ABNORMAL LOW (ref >60)
Glucose, Bld: 124 mg/dL — ABNORMAL HIGH (ref 70–99)
Phosphorus: 5 mg/dL — ABNORMAL HIGH (ref 2.5–4.6)
Potassium: 4.7 mmol/L (ref 3.5–5.1)
Sodium: 128 mmol/L — ABNORMAL LOW (ref 135–145)

## 2023-07-12 LAB — CBC
HCT: 31 % — ABNORMAL LOW (ref 39.0–52.0)
Hemoglobin: 10 g/dL — ABNORMAL LOW (ref 13.0–17.0)
MCH: 28.4 pg (ref 26.0–34.0)
MCHC: 32.3 g/dL (ref 30.0–36.0)
MCV: 88.1 fL (ref 80.0–100.0)
Platelets: 255 10*3/uL (ref 150–400)
RBC: 3.52 MIL/uL — ABNORMAL LOW (ref 4.22–5.81)
RDW: 16.8 % — ABNORMAL HIGH (ref 11.5–15.5)
WBC: 5.9 10*3/uL (ref 4.0–10.5)
nRBC: 0 % (ref 0.0–0.2)

## 2023-07-12 LAB — HEPATITIS B SURFACE ANTIGEN: Hepatitis B Surface Ag: NONREACTIVE

## 2023-07-12 MED ORDER — HEPARIN SODIUM (PORCINE) 1000 UNIT/ML DIALYSIS: 3000.0000 [IU] | INTRAMUSCULAR | Status: DC | PRN

## 2023-07-12 NOTE — Progress Notes (Signed)
 Occupational Therapy Session Note  Patient Details  Name: Carl Clowers Sr. MRN: [id number] Date of Birth: April 08, 1960  Today's Date: 07/12/2023 OT Individual Time: 2703-5009 OT Individual Time Calculation (min): 70 min    Short Term Goals: Week 6:  OT Short Term Goal 1 (Week 6): Pt will don footwear SBA OT Short Term Goal 2 (Week 6): Pt will complete sit to stand from 23" surface with CGA with LRAD in preparation for ADLs OT Short Term Goal 3 (Week 6): Pt will complete dynamic standing task at SBA with LRAD  Skilled Therapeutic Interventions/Progress Updates:    Pt seated EOB donning socks and shoes upon arrival. Pt completed task with supervision. Amb with RW to PWC and transported soiled clothes to laundry room. Pt amb with RW from hallway to laundry room and placed all clothes in washer. Pt returned to [facility] and continued on to gym. Standing task with corn hole. No LOB. Pt amb with RW and used reacher to retrieve bean bags and place in container. Pt returned to w/c and stood at table to clean all bean bags. No LOB noted througout session. All sit<>stand from elevated surface of PWC. Pt returned to room and remained in PWC. All needs within reach.   Therapy Documentation Precautions:  Precautions Precautions: Fall, Cervical, Other (comment) Recall of Precautions/Restrictions: Impaired Precaution/Restrictions Comments: Verbally reviewed cervical precautions. Required Braces or Orthoses: Cervical Brace Cervical Brace: Soft collar, At all times Restrictions Weight Bearing Restrictions Per Provider Order: No   Pain: Pt reports his Rt hip was "hurting" and he had already requested pain meds; pain meds admin by LPN during session   Therapy/Group: Individual Therapy  [name] 07/12/2023, 10:21 AM

## 2023-07-12 NOTE — Plan of Care (Signed)
  Problem: Consults Goal: RH SPINAL CORD INJURY PATIENT EDUCATION Description:  See Patient Education module for education specifics.  Outcome: Adequate for Discharge   Problem: SCI BOWEL ELIMINATION Goal: RH STG MANAGE BOWEL WITH ASSISTANCE Description: STG Manage Bowel with minimal Assistance. Outcome: Progressing Goal: RH STG SCI MANAGE BOWEL WITH MEDICATION WITH ASSISTANCE Description: STG SCI Manage bowel with medication with assistance. Outcome: Progressing   Problem: RH SKIN INTEGRITY Goal: RH STG SKIN FREE OF INFECTION/BREAKDOWN Description: Manage skin free of infection/breakdown with minimal assistance Outcome: Progressing

## 2023-07-12 NOTE — Progress Notes (Signed)
 Physical Therapy Session Note  Patient Details  Name: Carl Townley Sr. MRN: [id number] Date of Birth: 1960-08-11  Today's Date: 07/12/2023 PT Individual Time: 0945-1100 PT Individual Time Calculation (min): 75 min   Short Term Goals: Week 6:  PT Short Term Goal 1 (Week 6): = LTGs  Skilled Therapeutic Interventions/Progress Updates:    Session focused on functional tranfsers (sit <> stands) with RW, stair negotiation for home entry practice and overall strengthening, and community reintegration with PWC in restaurant environment. Sit > stand with CGA and extra time from elevated surface. Utilized B rails and negotiation x 20 steps (6") with improved stability today and overall CGA for balance and slow speed. Good safety noted. NMR for Biodex (min A for step up onto this step (taller)) for limits of stability program on non compliant and then compliant surface (level 8) for balance retraining. Pt reports incresed difficulty with compliant surface and increased knee flexion and compensatory strategies. Navigated through hospital with PWC at [facility] I level including navigating on and off elevators, outdoors and in restaurant environment. D/c discussed and recommendations for supervision at home as well as assist for stairs (pt reports son will be able to provide help on stairs). Pt will need to be aware of height of surfaces due to increased difficulty with sit > stands from lower surfaces. Returned back to room awaiting OT.   Therapy Documentation Precautions:  Precautions Precautions: Fall, Cervical, Other (comment) Recall of Precautions/Restrictions: Impaired Precaution/Restrictions Comments: Verbally reviewed cervical precautions. Required Braces or Orthoses: Cervical Brace Cervical Brace: Soft collar, At all times Restrictions Weight Bearing Restrictions Per Provider Order: No    Pain: No reports of pain this session. States hip was better last night.    Therapy/Group: Individual  Therapy  Gita Lamb Amadeo June, PT, DPT, CBIS  07/12/2023, 12:43 PM

## 2023-07-12 NOTE — Progress Notes (Signed)
 [facility] Progress Note   Subjective:   Seen in [facility]. No concerns. Has been approved for SNF at discharge.   Objective Vitals:   07/12/23 1400 07/12/23 1432 07/12/23 1445 07/12/23 1500  BP: 105/71 (!) 94/54 102/78 98/61  Pulse: 66 87 96 72  Resp: 15 15 15 15   Temp:      TempSrc:      SpO2: 93% 100% 99% 100%  Weight:      Height:       Physical Exam General: Alert male in NAD Lungs: Respirations unlabored on RA Abdomen: Non-distended Extremities: No edema b/l lower extremities Dialysis Access: RUE AVF + t/b  Additional Objective Labs: Basic Metabolic Panel: Recent Labs  Lab 07/08/23 1343 07/10/23 1351 07/12/23 1100  NA 131* 131* 128*  K 5.3* 5.1 4.7  CL 91* 93* 89*  CO2 21* 22 22  GLUCOSE 110* 119* 124*  BUN 75* 67* 60*  CREATININE 9.65* 9.42* 8.57*  CALCIUM  8.2* 8.3* 8.2*  PHOS 4.9* 4.9* 5.0*   Liver Function Tests: Recent Labs  Lab 07/08/23 1343 07/10/23 1351 07/12/23 1100  ALBUMIN  2.9* 2.8* 2.8*   No results for input(s): "LIPASE", "AMYLASE" in the last 168 hours. CBC: Recent Labs  Lab 07/05/23 2107 07/08/23 1343 07/10/23 1351 07/12/23 1100  WBC 4.8 5.5 5.7 5.9  HGB 9.1* 9.4* 9.3* 10.0*  HCT 29.3* 29.8* 29.7* 31.0*  MCV 88.3 89.0 89.2 88.1  PLT 237 270 237 255   Blood Culture    Component Value Date/Time   SDES FLUID 06/18/2023 1357   SPECREQUEST NONE 06/18/2023 1357   CULT  06/18/2023 1357    NO GROWTH 3 DAYS Performed at [facility], [address] [place], [place] [address]    REPTSTATUS 06/22/2023 FINAL 06/18/2023 1357    Cardiac Enzymes: No results for input(s): "CKTOTAL", "CKMB", "CKMBINDEX", "TROPONINI" in the last 168 hours. CBG: No results for input(s): "GLUCAP" in the last 168 hours. Iron Studies: No results for input(s): "IRON", "TIBC", "TRANSFERRIN", "FERRITIN" in the last 72 hours. @lablastinr3 @ Studies/Results: No results found. Medications:   (feeding supplement) PROSource Plus  30 mL Oral  BID BM   atorvastatin   10 mg Oral Q Thu   calcitRIOL   1.5 mcg Oral Q M,W,F-HD   carbamide peroxide  5 drop Right EAR BID   Chlorhexidine  Gluconate Cloth  6 each Topical Q0600   Chlorhexidine  Gluconate Cloth  6 each Topical Q0600   Chlorhexidine  Gluconate Cloth  6 each Topical Q0600   Chlorhexidine  Gluconate Cloth  6 each Topical Q0600   cinacalcet   120 mg Oral Q supper   darbepoetin (ARANESP ) injection - DIALYSIS  200 mcg Subcutaneous Q Tue-1800   gabapentin   300 mg Oral QHS   Gerhardt's butt cream   Topical BID   pantoprazole   40 mg Oral BID AC   psyllium  1 packet Oral TID   senna-docusate  2 tablet Oral BID   sevelamer  carbonate  3,200 mg Oral TID WC   sodium zirconium cyclosilicate   10 g Oral Daily   topiramate   25 mg Oral BID    Dialysis Orders: OP Dialysis Orders [facility] - MWF 4h  B400   85.5kg   2K bath  AVF   - Heparin  3000 units   - Mircera 60mcg IV q 2 weeks- last dose 05/24/23  - Calcitriol  1.5mcg PO q HD  Assessment/Plan: 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  HD today.  Hyperkalemia:Chronic issue.  On Lokelma  daily now. Continue to reinforce renal diet. HTN/volume: Tolerates high UF goals, 3.5-4L. Will need lower EDW at discharge Anemia of ESRD:  Hgb 9.3. We have ordered to further increase to aranesp  200 mcg weekly on Tuesdays  Secondary HPTH: Calcium  and phos at goal. Continue sevelamer  and sensipar . Resumed calcitriol   Nutrition: continue protein supplements. Patient reports appetite is better Dispo - For discharge to [facility] Monday. Needs HD 1st shift.    Elona Hal PA-C [facility] 07/12/2023,3:26 PM

## 2023-07-12 NOTE — Progress Notes (Signed)
   07/12/23 1706  Vitals  Temp 98.4 F (36.9 C)  Pulse Rate (!) 113  Resp 17  BP 99/86  SpO2 100 %  O2 Device Room Air  Weight 84.5 kg  Type of Weight Post-Dialysis  Post Treatment  Dialyzer Clearance Heavily streaked  Hemodialysis Intake (mL) 0 mL  Liters Processed 67.7  Fluid Removed (mL) 2000 mL  Tolerated HD Treatment Yes  AVG/AVF Arterial Site Held (minutes) 10 minutes  AVG/AVF Venous Site Held (minutes) 10 minutes   Received patient in bed to unit.  Alert and oriented.  Informed consent signed and in chart.   TX duration:3HRS  Patient tolerated well.  Transported back to the room  Alert, without acute distress.  Hand-off given to patient's nurse.   Access used: RAVF Access issues: NONE  Total UF removed: 2L Medication(s) given: NONE    Na'Shaminy T [facility]

## 2023-07-12 NOTE — Plan of Care (Signed)
  Problem: Consults Goal: RH SPINAL CORD INJURY PATIENT EDUCATION Description:  See Patient Education module for education specifics.  Outcome: Progressing   Problem: SCI BOWEL ELIMINATION Goal: RH STG MANAGE BOWEL WITH ASSISTANCE Description: STG Manage Bowel with minimal Assistance. Outcome: Progressing Goal: RH STG SCI MANAGE BOWEL WITH MEDICATION WITH ASSISTANCE Description: STG SCI Manage bowel with medication with assistance. Outcome: Progressing   Problem: RH SKIN INTEGRITY Goal: RH STG SKIN FREE OF INFECTION/BREAKDOWN Description: Manage skin free of infection/breakdown with minimal assistance Outcome: Progressing   Problem: RH SAFETY Goal: RH STG ADHERE TO SAFETY PRECAUTIONS W/ASSISTANCE/DEVICE Description: STG Adhere to Safety Precautions With minimal Assistance/Device. Outcome: Progressing

## 2023-07-12 NOTE — Progress Notes (Signed)
 Physical Therapy Weekly Progress Note  Patient Details  Name: Carl Mcnealy Sr. MRN: [id number] Date of Birth: 05/06/1960  Beginning of progress report period: Jul 06, 2023 End of progress report period: Jul 12, 2023  Pt is making great progress towards overall goals functionally. He still continues to demonstrate decreased ability to sit > stand from lower surfaces (less than 24") without requiring up to mod A for anterior weightshift and facilitation. He has been able to progress stair negotiation in the last couple days, with CGA on 6" steps with still instability in knee control at times. Pt is mod I at [facility] level for mobility, pressure relief, and positioning changes but will not be able to have PWC at home due to inaccessible home environment with plan to move to first floor apartment. Pt will need to be at ambulatory level which requires CGA at this time with RW. Pt is still awaiting approval for SNF for further rehab especially due to decreased caregiver support and home access.   Patient continues to demonstrate the following deficits muscle weakness and muscle joint tightness, decreased cardiorespiratoy endurance, unbalanced muscle activation and decreased coordination, and decreased sitting balance, decreased standing balance, decreased postural control, and decreased balance strategies and therefore will continue to benefit from skilled PT intervention to increase functional independence with mobility.  Patient progressing toward long term goals..  Continue plan of care.  PT Short Term Goals Week 6:  PT Short Term Goal 1 (Week 6): = LTGs PT Short Term Goal 1 - Progress (Week 6): Progressing toward goal Week 7:  PT Short Term Goal 1 (Week 7): = LTGs  Skilled Therapeutic Interventions/Progress Updates:  Ambulation/gait training;Cognitive remediation/compensation;Discharge planning;DME/adaptive equipment instruction;Functional mobility training;Pain management;Psychosocial  support;Therapeutic Activities;Splinting/orthotics;UE/LE Strength taining/ROM;Visual/perceptual remediation/compensation;Wheelchair Designer, jewellery;Therapeutic Exercise;Skin care/wound management;Patient/family education;Neuromuscular re-education;Disease management/prevention;Balance/vestibular training;Community reintegration;Functional electrical stimulation   Therapy Documentation Precautions:  Precautions Precautions: Fall, Cervical, Other (comment) Recall of Precautions/Restrictions: Impaired Precaution/Restrictions Comments: Verbally reviewed cervical precautions. Required Braces or Orthoses: Cervical Brace Cervical Brace: Soft collar, At all times Restrictions Weight Bearing Restrictions Per Provider Order: No   Faye Hoops, PT, DPT, CBIS  07/12/2023, 1:37 PM

## 2023-07-12 NOTE — Progress Notes (Signed)
 Advised by rehab CSW that pt will be d/c to snf on Monday. Contacted [facility] and spoke to FA who confirms pt can resume on Wed and pt can still have later appt time (arrive at 9:20 am for 9:45 am chair time). Clinic advised pt should d/c on Monday to snf and should resume care on Wed. Will assist as needed.   Lauraine Polite Renal Navigator [phone]

## 2023-07-12 NOTE — Progress Notes (Signed)
 Occupational Therapy Session Note  Patient Details  Name: Carl Lalor Sr. MRN: [id number] Date of Birth: 11/13/1960  Today's Date: 07/12/2023 OT Individual Time: 4098-1191 OT Individual Time Calculation (min): 58 min    Short Term Goals: Week 6:  OT Short Term Goal 1 (Week 6): Pt will don footwear SBA OT Short Term Goal 2 (Week 6): Pt will complete sit to stand from 23" surface with CGA with LRAD in preparation for ADLs OT Short Term Goal 3 (Week 6): Pt will complete dynamic standing task at SBA with LRAD  Skilled Therapeutic Interventions/Progress Updates:    Skilled OT intervention with focus on PWC mobility in community setting, community integration, discharge planning, sit<>stand from elevated surface, and safety awareness to increase independence with BADLs. Pt mobilized PWC throughout hallways and onto elevator to access [employer]. Pt maneuvered throughout pedestrian traffic in hallway and in [place] with supervision. Pt managed purchases safely in PWC. PWC mobility on uneven surfaces in courtyard with supervision. Sit<>stand from [facility] with CGA. Pt returned to room and remained in PWC. All needs within reach.   Therapy Documentation Precautions:  Precautions Precautions: Fall, Cervical, Other (comment) Recall of Precautions/Restrictions: Impaired Precaution/Restrictions Comments: Verbally reviewed cervical precautions. Required Braces or Orthoses: Cervical Brace Cervical Brace: Soft collar, At all times Restrictions Weight Bearing Restrictions Per Provider Order: No   Pain: Pt denies pain this afternoon  Therapy/Group: Individual Therapy  [name] 07/12/2023, 12:15 PM

## 2023-07-12 NOTE — Progress Notes (Signed)
 PROGRESS NOTE   Subjective/Complaints:  Pt reports frustration about his diet- I explained no change to his diet since 4/17.   Waiting to hear from [place]- as of this AM- said would go home tomorrow if need be  Will send to SNF when able to- likely Tuesday since Insurance approved SNF placement  ROS: -   Pt denies SOB, abd pain, CP, N/V/C/D, and vision changes   B/L R>L knee pain (+)- R knee very improved +R hip pain-managed but severe arthritis  Objective:   No results found.      Recent Labs    07/10/23 1351  WBC 5.7  HGB 9.3*  HCT 29.7*  PLT 237     Recent Labs    07/10/23 1351  NA 131*  K 5.1  CL 93*  CO2 22  GLUCOSE 119*  BUN 67*  CREATININE 9.42*  CALCIUM  8.3*     Intake/Output Summary (Last 24 hours) at 07/12/2023 1339 Last data filed at 07/12/2023 1314 Gross per 24 hour  Intake 720 ml  Output --  Net 720 ml        Physical Exam: Vital Signs Blood pressure 113/81, pulse 81, temperature 98 F (36.7 C), temperature source Oral, resp. rate 16, height 6\' 1"  (1.854 m), weight 87.6 kg, SpO2 100%.      General: awake, alert, appropriate, sitting up in w/c- power; NAD HENT: conjugate gaze; oropharynx moist CV: regular rate and rhythm; no JVD Pulmonary: CTA B/L; no W/R/R- good air movement GI: soft, NT, ND, (+)BS Psychiatric: appropriate- bright affect Neurological: Ox3   MSK: cannot make fist at all on L hand-  and can make fist on R hand, but only for 3 seconds.   PRIOR EXAMS:MSK- R lateral and groin TTP- c/w trochanteric bursitis and R joint pain  Neurological: Ox3, follows commands, cranial nerves II through XII grossly intact,   MSK- R knee much less swollen MsK: B/L knees with effusions- less TTP on R knee this AM- looks good- mild effusion , no erythema, not really focally warm to touch- Both have effusions, - R hand swelling looks good/resolved  MSK- R hand swelling esp  ulnar side of hand/dorsum and L wrist-stable this AM RUE- Biceps 4/5; WE 4-/5; Triceps 2/5; Grip 2-/5 and FA 1/5 LUE- 4/5 except triceps 3+ to 4-/5 RLE- HF 1 to 2-/5; KE 2-/5; DF/PF 2-/5 LLE- HF 3/5; KE 4-/5; DF/PF 4/5    Skin: C/D/I. Multiple lesions on BL legs with various stages of healing.  + RUE fistula - intact   MSK:      LLE knee with palpable effusion, no warmth, no ttp Mood: Pleasant affect, appropriate mood.  Neurologic exam: Alert and oriented x 4, fluent, follows commands, cranial nerves II through XII grossly intact, no hypertonia noted  Sensation: Reduced to light touch in distal right lower extremity to the mid-thigh       Strength:                RUE: 2/5 SA, 2/5 EF, 2/5 EE, 2/5 WE, 2/5 FF, 2/5 FA                LUE:  3/5  SA, 3/5 EF, 3/5 EE, 3/5 WE, 3/5 FF, 3/5 FA                RLE: 2/5 HF, 2/5 KE, 2/5  DF, 2/5  EHL, 2/5  PF                 LLE:  3/5 HF, 4/5 KE, 4/5  DF, 4/5  EHL, 4/5  PF  Neuroexam stable 4/21  Assessment/Plan: 1. Functional deficits which require 3+ hours per day of interdisciplinary therapy in a comprehensive inpatient rehab setting. Physiatrist is providing close team supervision and 24 hour management of active medical problems listed below. Physiatrist and rehab team continue to assess barriers to discharge/monitor patient progress toward functional and medical goals  Care Tool:  Bathing    Body parts bathed by patient: Right arm, Left arm, Chest, Abdomen, Front perineal area, Buttocks, Face, Left lower leg, Right lower leg, Left upper leg, Right upper leg   Body parts bathed by helper: Left arm, Front perineal area, Buttocks, Right lower leg, Left lower leg     Bathing assist Assist Level: Supervision/Verbal cueing     Upper Body Dressing/Undressing Upper body dressing   What is the patient wearing?: Pull over shirt    Upper body assist Assist Level: Independent with assistive device    Lower Body Dressing/Undressing Lower body  dressing      What is the patient wearing?: Pants, Underwear/pull up     Lower body assist Assist for lower body dressing: Supervision/Verbal cueing     Toileting Toileting Toileting Activity did not occur (Clothing management and hygiene only): N/A (no void or bm) (anuric, HD patient)  Toileting assist Assist for toileting: Supervision/Verbal cueing     Transfers Chair/bed transfer  Transfers assist     Chair/bed transfer assist level: Contact Guard/Touching assist     Locomotion Ambulation   Ambulation assist   Ambulation activity did not occur: Safety/medical concerns  Assist level: Contact Guard/Touching assist Assistive device: Walker-rolling Max distance: 150   Walk 10 feet activity   Assist  Walk 10 feet activity did not occur: Safety/medical concerns  Assist level: Contact Guard/Touching assist Assistive device: Walker-rolling   Walk 50 feet activity   Assist Walk 50 feet with 2 turns activity did not occur: Safety/medical concerns  Assist level: Contact Guard/Touching assist Assistive device: Walker-rolling    Walk 150 feet activity   Assist Walk 150 feet activity did not occur: Safety/medical concerns  Assist level: Contact Guard/Touching assist Assistive device: Walker-rolling    Walk 10 feet on uneven surface  activity   Assist Walk 10 feet on uneven surfaces activity did not occur: Safety/medical concerns   Assist level: Contact Guard/Touching assist Assistive device: Walker-rolling   Wheelchair     Assist Is the patient using a wheelchair?: Yes Type of Wheelchair: Power    Wheelchair assist level: Independent Max wheelchair distance: >500    Wheelchair 50 feet with 2 turns activity    Assist        Assist Level: Independent   Wheelchair 150 feet activity     Assist      Assist Level: Independent   Blood pressure 113/81, pulse 81, temperature 98 F (36.7 C), temperature source Oral, resp. rate  16, height 6\' 1"  (1.854 m), weight 87.6 kg, SpO2 100%.  Medical Problem List and Plan: 1. 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  Antithrombotics: -DVT/anticoagulation:  Mechanical: Sequential compression devices, below knee Bilateral lower extremities             -antiplatelet therapy: N/A--no antiplatelet due to hx of recurrent GIB  4/25- d/w family- they agree that Lovenox  not appropriate at this time.   -06/09/23 ordered dopplers to r/o clots given low grade temps recently  4/28- LE Dopplers (-) for DVT 3. Pain Management:  oxycodone  prn. Gabapentin  300 mg/hs.              --has Flexeril  10 mg TID prn              - patient with intermittent, severe b/l headache - add topomax 25 mg BID    4/19- pt rating pain 07/19/08 which is "tolerable" 5/7- got Ortho to come and take fluid off R knee- got 100cc off- pain is no more than 1/10 this AM! 5/8-5/9 knee still doing well 5/13-5/14-  pain controlled- allowed him to walk further yesterday in therapy- still cannot do more than 1 step 5/16- will get R hip xray due to clunk and R hi pain -06/29/23 R hip xray with cystic changes of femoral head/neck, also degeneration on my review- will need ortho f/up but doubt need for emergent consultation. Dr. Christiane Cowing is his primary ortho 5/20- Pt's Ortho responded and knows about Deg arthritis in R hip- but cannot address while in hospital  5/26- no change on R hip pain 5/27- still bad and now L knee buckling- placed L knee brace 4. Mood/Behavior/Sleep: LCSW to follow for evaluation and support             -antipsychotic agents: N/A              --melatonin prn for insomnia.  -06/09/23 poor sleep, d/t interruptions, asked nursing to minimize interruptions between 11p-5a-- sleeping better lately 5. Neuropsych/cognition: This patient is capable of making decisions on his own behalf. 5/8- Ox3 and appropriate 6. Skin/Wound Care: Routine pressure relief measures.   4/19- due to ulcers on legs, on low air loss mattress 7. Fluids/Electrolytes/Nutrition: Strict I/O. Daily weights. Labs with HD. 8. ESRD- HD MWF: Schedule HD at the end of the day to help with tolerance of therapy             --Sensipar  and Renvela  for metabolic bone disease.   - Patient reports no longer makes urine  5/13 - last HD yesterday 5/15- last HD yesterday=- they were not able to finish due to equipment issues- will need to monitor fluid intake today- so can make it til tomorrow- they are not doing more today.  5/28- HD today 9. Anemia of chronic disease: Monitor H/H, aranesp  5/9- Hb 7.6 in last 24 hours- done yesterday- feels better- no dizziness or fatigue- so doesn't feel transfusion is appropriate 5/12 HGB up to 8.1 , improved continue to monitor.  He is on Aranesp  per nephrology 5/15- pt needs to have CBC rechecked in HD tomorrow-  -06/29/23 Hgb 7.8, stable. Monitor.  5/20- Hb 8.0 5/21- refused labs this AM since getting  in HD- overall labs looking better 2 days ago 5/22- Hb 9.5 this AM!! 5/28- Labs today? HD today 5/29- Hb 9.3- stable 10. H/o recurrent GIB/Jehovah's [employer]- will only take blood products in absolute emergency per Renal note: Continue Protonix  BID 11. H/o Hypotension: Has had drop  in BP with HD. Now cervical SCI.  --Monitor for symptoms day after HD and with activity. -4/18 baseline blood pressure stable, monitor with activity 4/19- BP actually slightly elevated today- 140s-150's- con't regimen, since can drop with HD and therapy 4/20- BP actually doing better today- in 100s'110s- however HR elevated, likely to fluid overload 4/21  nephrology started midodrine  10mg  before dialysis, BP appears to be doing a little better today 5/8- BP on low side in last 24 hours- likely made worse due to HD yesterday- denies OH Sx's. Con't to monitor trend 5/9- BP is running in 90's systolic- doesn't feel dizzy or lightheaded sitting, but could drop when standing-will d/w therapy 5/22- 5/25- 5/26- Drop in BP yesterday with standing to 90's systolic- felt dizzy- will on Midodrine - will wait to increase 5/27- 5/29- tolerating therapy without drops in BP 5/30- BP doing better- con't regimen Vitals:   07/10/23 1630 07/10/23 1645 07/10/23 1700 07/10/23 1730  BP: 90/70 103/70 94/71 98/70    07/10/23 1746 07/10/23 1756 07/10/23 1859 07/10/23 2031  BP: (!) 81/50 103/67 122/74 95/64   07/11/23 0340 07/11/23 1645 07/11/23 2021 07/12/23 0538  BP: 115/70 131/78 116/73 113/81     12. Constipation: Small BM on 04/14 --had of nulytey yesterday and another today? -Xray 4/17 without excessive stool, patient reports stool still little hard yesterday.  Will increase MiraLAX  to 3 times a day, continue Senokot twice a day. Suppository PRN   06/09/23 LBM yesterday, cont regimen 4/28- LBM overnight- has been refusing miralax - made it prn- taking metamucil 5/28- LBM yesterday 13. ?left knee effusion. Not currently bothersome. Consider imaging.  14.  SIRS- Fever/hypotension/tachycardia - 4/21 Currently afebrile, he got to 50 cc IVF yesterday, CXR negative, EKG sinus tachycardia. Patient was started on Maxipime  and vancomycin  yesterday.  Appears to be doing much better today.  WBC not elevated at 7.1.  Continue to follow blood cultures 4/22- Blood Cx's (-) so far- WBC 7.3- will con't IV ABX until know final results of blood Cx's- CXR (-)- but temp was 103.2 yesterday overnight.  4/23- Blood C'x (-) x2 days so far- T 99.9 this Am after a few sips of coffee- was 100.4 this AM- will con't IV ABX since concerned about possible bacteremia- esp since having low  grade temps still 4/24- No more fevers, even low grade- no growth on blood Cx' bu it says that didn't get enough blood, so much be falsely negative 4/25- No growth for 5 days, but it also said not enough blood to be sure was a true negative. Will change stop date til tomorrow -06/08/23 at 3am temp recorded as 100.3 but 5am was 98.3; will get CBC w/diff, monitor temps closely today, if continued low grade temps, will reach out to ID. BCx NGTD x5d. Didn't get vanc yesterday? Or cefepime ? Unclear why. Cefepime  started this morning at 0011. -06/09/23 temp 100.3 at 3am yesterday and then 100 later in the day, but no fevers since, no tachycardia.  -WBC yesterday 8.9.  -Now with some confusion; spoke with Dr. Levern Reader of ID, stated cefepime  could be the cause of the confusion -recommended we stop all abx (which ended yesterday anyway) -watch the patient OFF abx -if fevers >100.3  occur then she recommended repeat BCx and CXR and could call her back to see if any abx would be advised at that time.  -Head CT and dopplers ordered as well, as above 4/28- CT looks good- no fever, WBC is OK- off ABX 4/29- Also Dopplers (-) feeling good this AM 06/16/23 seems to have resolved. 15. Tachycardia- resolved 4/22- doing better- right at 100 this AM- will monitor- likely can be from dregs of infection vs OH. -will monitor  4/23- Tachycardia improving- 4/24-27 HR in 90's right now- doing better  4/29- 4/30- HR running in 90s-low 100's today 5/12 heart rate in the 70s and 80s primarily, continue to monitor 5/20- HR back into low 100's- but is feeling good- denies Sx's.  5/22- back to running 80s-high 90's- will monitor trend 5/29- HR in 80s-90's in last few days- doing better    07/12/2023    5:40 AM 07/12/2023    5:38 AM 07/11/2023    8:21 PM  Vitals with BMI  Weight 193 lbs 3 oz    Systolic  113 116  Diastolic  81 73  Pulse  81 83    16. R hand swelling  4/23- doing ice- will do xrays 4/24- Xray doesn't look  like cause of swelling, however could be due to new shunt? 4/30- resolved 17. Hypocalcemia 4/24- his Albumin  is 2.1, so probably ok, and phos coming down- will leave to renal- thank you for their help  5/1- Ca 9.3 5/8- Ca 8.8 5/15- Ca 8.5 18. Hyperkalemia 4/28- K+ 6.3 today- got hypoosmotic bath to reduce K+ per note from RN/HD nurse? Also has Na of 127- being addressed by HD/renal- Phos also 5.8- but again, getting HD today 5/1- K+ down to 5.6 and Na 129 5/6- Na 133 and K+ down to 4.7 from 5.6 5/7- refused labs this AM- wants in HD 5/9- K_ came back as 4.5 5/12 potassium elevated today, chronic issue has dialysis today.  Treatment per renal team 5/14- K+ down to 5.7- from 6.2- has HD today 06/29/23 down to 5.3 yesterday, had dialysis yesterday.  5/19- HD today 5/20- K+ stayed at 5.3- doing better 5/24- K+ 4.5 yesterday- best it's been 5/27- K+ 5.3- per renal 5/29- K+ 5.1 with HD yesterday 19. R>L knee pain 4/29- will ask Ortho if they will tap his R knee- I have no problem doing injection of knee, however with so much fluid, I would rather Ortho tap him and then do steroid injection.   4/30- we added Prednisone  40 mg daily x 3 days- to see if helps knee pain  5/1- mild reduction in pain so far 5/6- Pt has short term improvement from Prednisone  dosing for knees- con't percocet prn 5/7- since pt was doing so much worse with therapy, asked Ortho to come and tap knee and take off fluid- do steroid injection- they drew off 100CC- 5 different draws required- fluid is pending, but has a lot of WBCs- no organisms on gram stain.   5/8- no growth for 24 hours- R knee pain so much better  -06/22/23 NGTD x3 days, monitor  5/14- No growth  - pain so much better after 100cc fluid removed and got injection 20.  R hip pain 5/16- will get R hip xray- also c/w trochanteric bursitis on exam- if doesn't improve, will do trochanteric bursa injection Tuesday -06/29/23 R hip xray with cystic changes of  femoral head/neck, also degeneration on my review- will need ortho f/up but doubt need for emergent  consultation. Dr. Christiane Cowing is his primary ortho.  5/20- Dr Christiane Cowing wrote that he has severe arthritis, but needs to be out of hospital to get hip replacement.  5/29- had severe pain in R hip last night at 1:30am- is better after pain meds    LOS: 43 days A FACE TO FACE EVALUATION WAS PERFORMED  Jaecion Dempster 07/12/2023, 1:39 PM

## 2023-07-12 NOTE — Progress Notes (Addendum)
 Patient ID: Carl Bissonette Sr., male   DOB: [birth year], 63 y.o.   MRN: [id number] Continue to await appeal decision, according to [facility] health still processing. Pt aware and reports have heard no word either  1:06 PM Spoke with navi who reports the appeal has been overturned and has been approved. Made pt aware of this and contacted Star at [facility] to let know. She is currently full. He has approval from 5/30-6/3 will need to leave at the latest 6/3. Will let team, PA and renal navigator know of plan. Await Star to get back with regarding when would have a bed to take him  2:41 PM [name] can take him Monday will need to do first run HD and then go after. [place] aware of OP-HD schedule same as before [name] 9:20 be there chair time 9:45 am. Pam-PA aware of this plan and rest of team. His approval is [id number] CM Clemmie Cyphers 5/30-6/3. Called pt while in HD to let him know the plan for Monday.

## 2023-07-13 DIAGNOSIS — I9589 Other hypotension: Secondary | ICD-10-CM

## 2023-07-13 DIAGNOSIS — K5901 Slow transit constipation: Secondary | ICD-10-CM

## 2023-07-13 NOTE — Progress Notes (Signed)
 [facility] Progress Note   Subjective:    Seen in room. Aunt visiting. No issues with dialysis.   Objective Vitals:   07/12/23 1706 07/12/23 1800 07/12/23 2021 07/13/23 0502  BP: 99/86 111/70 (!) 99/59 108/68  Pulse: (!) 113 95 97 88  Resp: 17 20 18 17   Temp: 98.4 F (36.9 C) 98 F (36.7 C) 99 F (37.2 C) 98 F (36.7 C)  TempSrc:  Oral Oral Oral  SpO2: 100% 100% 98% 99%  Weight: 84.5 kg   88.4 kg  Height:       Physical Exam General: Alert male in NAD Lungs: Respirations unlabored on RA Abdomen: Non-distended Extremities: No edema b/l lower extremities Dialysis Access: RUE AVF + t/b  Additional Objective Labs: Basic Metabolic Panel: Recent Labs  Lab 07/08/23 1343 07/10/23 1351 07/12/23 1100  NA 131* 131* 128*  K 5.3* 5.1 4.7  CL 91* 93* 89*  CO2 21* 22 22  GLUCOSE 110* 119* 124*  BUN 75* 67* 60*  CREATININE 9.65* 9.42* 8.57*  CALCIUM  8.2* 8.3* 8.2*  PHOS 4.9* 4.9* 5.0*   Liver Function Tests: Recent Labs  Lab 07/08/23 1343 07/10/23 1351 07/12/23 1100  ALBUMIN  2.9* 2.8* 2.8*   No results for input(s): "LIPASE", "AMYLASE" in the last 168 hours. CBC: Recent Labs  Lab 07/08/23 1343 07/10/23 1351 07/12/23 1100  WBC 5.5 5.7 5.9  HGB 9.4* 9.3* 10.0*  HCT 29.8* 29.7* 31.0*  MCV 89.0 89.2 88.1  PLT 270 237 255   Blood Culture    Component Value Date/Time   SDES FLUID 06/18/2023 1357   SPECREQUEST NONE 06/18/2023 1357   CULT  06/18/2023 1357    NO GROWTH 3 DAYS Performed at [facility], [address] [place], [place] [address]    REPTSTATUS 06/22/2023 FINAL 06/18/2023 1357    Cardiac Enzymes: No results for input(s): "CKTOTAL", "CKMB", "CKMBINDEX", "TROPONINI" in the last 168 hours. CBG: No results for input(s): "GLUCAP" in the last 168 hours. Iron Studies: No results for input(s): "IRON", "TIBC", "TRANSFERRIN", "FERRITIN" in the last 72 hours. @lablastinr3 @ Studies/Results: No results found. Medications:    (feeding supplement) PROSource Plus  30 mL Oral BID BM   atorvastatin   10 mg Oral Q Thu   calcitRIOL   1.5 mcg Oral Q M,W,F-HD   carbamide peroxide  5 drop Right EAR BID   Chlorhexidine  Gluconate Cloth  6 each Topical Q0600   cinacalcet   120 mg Oral Q supper   darbepoetin (ARANESP ) injection - DIALYSIS  200 mcg Subcutaneous Q Tue-1800   gabapentin   300 mg Oral QHS   Gerhardt's butt cream   Topical BID   pantoprazole   40 mg Oral BID AC   psyllium  1 packet Oral TID   senna-docusate  2 tablet Oral BID   sevelamer  carbonate  3,200 mg Oral TID WC   sodium zirconium cyclosilicate   10 g Oral Daily   topiramate   25 mg Oral BID    Dialysis Orders: OP Dialysis Orders [facility] - MWF 4h  B400   85.5kg   2K bath  AVF   - Heparin  3000 units   - Mircera 60mcg IV q 2 weeks- last dose 05/24/23  - Calcitriol  1.5mcg PO q HD  Assessment/Plan: 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  Continue on schedule.  Hyperkalemia:Chronic issue.  On Lokelma  daily now. Continue to reinforce renal diet. HTN/volume: Tolerates high UF goals, 3.5-4L. Will need  lower EDW at discharge Anemia of ESRD:  Hgb 9.3. We have ordered to further increase to aranesp  200 mcg weekly on Tuesdays  Secondary HPTH: Calcium  and phos at goal. Continue sevelamer  and sensipar . Resumed calcitriol   Nutrition: continue protein supplements. Patient reports appetite is better Dispo - For discharge to [facility] Monday. Needs HD 1st shift.    Elona Hal PA-C [facility] 07/13/2023,12:30 PM

## 2023-07-13 NOTE — Plan of Care (Signed)
  Problem: Consults Goal: RH SPINAL CORD INJURY PATIENT EDUCATION Description:  See Patient Education module for education specifics.  Outcome: Progressing   Problem: SCI BOWEL ELIMINATION Goal: RH STG MANAGE BOWEL WITH ASSISTANCE Description: STG Manage Bowel with minimal Assistance. Outcome: Progressing Goal: RH STG SCI MANAGE BOWEL WITH MEDICATION WITH ASSISTANCE Description: STG SCI Manage bowel with medication with assistance. Outcome: Progressing   Problem: RH SKIN INTEGRITY Goal: RH STG SKIN FREE OF INFECTION/BREAKDOWN Description: Manage skin free of infection/breakdown with minimal assistance Outcome: Adequate for Discharge

## 2023-07-13 NOTE — Progress Notes (Signed)
 PROGRESS NOTE   Subjective/Complaints:  Pt doing well, happy with appeal being successful. Slept ok, pain well managed, LBM last night per pt. Anuric, dialysis yesterday was fine. No other complaints or concerns.   ROS: -   Pt denies SOB, abd pain, CP, N/V/C/D, and vision changes   B/L R>L knee pain (+)- R knee very improved +R hip pain-managed but severe arthritis  Objective:   No results found.      Recent Labs    07/10/23 1351 07/12/23 1100  WBC 5.7 5.9  HGB 9.3* 10.0*  HCT 29.7* 31.0*  PLT 237 255     Recent Labs    07/10/23 1351 07/12/23 1100  NA 131* 128*  K 5.1 4.7  CL 93* 89*  CO2 22 22  GLUCOSE 119* 124*  BUN 67* 60*  CREATININE 9.42* 8.57*  CALCIUM  8.3* 8.2*     Intake/Output Summary (Last 24 hours) at 07/13/2023 1331 Last data filed at 07/13/2023 0844 Gross per 24 hour  Intake 240 ml  Output 2000 ml  Net -1760 ml        Physical Exam: Vital Signs Blood pressure 108/68, pulse 88, temperature 98 F (36.7 C), temperature source Oral, resp. rate 17, height 6\' 1"  (1.854 m), weight 88.4 kg, SpO2 99%.      General: awake, alert, appropriate, resting in bed; NAD HENT: conjugate gaze; oropharynx moist CV: regular rate and rhythm; no JVD Pulmonary: CTA B/L; no W/R/R- good air movement GI: soft, NT, ND, (+)BS Psychiatric: appropriate- bright affect Neurological: Ox3    PRIOR EXAMS: MSK- R lateral and groin TTP- c/w trochanteric bursitis and R joint pain  MSK: cannot make fist at all on L hand-  and can make fist on R hand, but only for 3 seconds.   Neurological: Ox3, follows commands, cranial nerves II through XII grossly intact,   MSK- R knee much less swollen MsK: B/L knees with effusions- less TTP on R knee this AM- looks good- mild effusion , no erythema, not really focally warm to touch- Both have effusions, - R hand swelling looks good/resolved  MSK- R hand swelling  esp ulnar side of hand/dorsum and L wrist-stable this AM RUE- Biceps 4/5; WE 4-/5; Triceps 2/5; Grip 2-/5 and FA 1/5 LUE- 4/5 except triceps 3+ to 4-/5 RLE- HF 1 to 2-/5; KE 2-/5; DF/PF 2-/5 LLE- HF 3/5; KE 4-/5; DF/PF 4/5    Skin: C/D/I. Multiple lesions on BL legs with various stages of healing.  + RUE fistula - intact   MSK:      LLE knee with palpable effusion, no warmth, no ttp Mood: Pleasant affect, appropriate mood.  Neurologic exam: Alert and oriented x 4, fluent, follows commands, cranial nerves II through XII grossly intact, no hypertonia noted  Sensation: Reduced to light touch in distal right lower extremity to the mid-thigh       Strength:                RUE: 2/5 SA, 2/5 EF, 2/5 EE, 2/5 WE, 2/5 FF, 2/5 FA                LUE:  3/5 SA, 3/5 EF,  3/5 EE, 3/5 WE, 3/5 FF, 3/5 FA                RLE: 2/5 HF, 2/5 KE, 2/5  DF, 2/5  EHL, 2/5  PF                 LLE:  3/5 HF, 4/5 KE, 4/5  DF, 4/5  EHL, 4/5  PF  Neuroexam stable 4/21  Assessment/Plan: 1. Functional deficits which require 3+ hours per day of interdisciplinary therapy in a comprehensive inpatient rehab setting. Physiatrist is providing close team supervision and 24 hour management of active medical problems listed below. Physiatrist and rehab team continue to assess barriers to discharge/monitor patient progress toward functional and medical goals  Care Tool:  Bathing    Body parts bathed by patient: Right arm, Left arm, Chest, Abdomen, Front perineal area, Buttocks, Face, Left lower leg, Right lower leg, Left upper leg, Right upper leg   Body parts bathed by helper: Left arm, Front perineal area, Buttocks, Right lower leg, Left lower leg     Bathing assist Assist Level: Supervision/Verbal cueing     Upper Body Dressing/Undressing Upper body dressing   What is the patient wearing?: Pull over shirt    Upper body assist Assist Level: Independent with assistive device    Lower Body Dressing/Undressing Lower  body dressing      What is the patient wearing?: Pants, Underwear/pull up     Lower body assist Assist for lower body dressing: Supervision/Verbal cueing     Toileting Toileting Toileting Activity did not occur (Clothing management and hygiene only): N/A (no void or bm) (anuric, HD patient)  Toileting assist Assist for toileting: Supervision/Verbal cueing     Transfers Chair/bed transfer  Transfers assist     Chair/bed transfer assist level: Contact Guard/Touching assist     Locomotion Ambulation   Ambulation assist   Ambulation activity did not occur: Safety/medical concerns  Assist level: Contact Guard/Touching assist Assistive device: Walker-rolling Max distance: 150   Walk 10 feet activity   Assist  Walk 10 feet activity did not occur: Safety/medical concerns  Assist level: Contact Guard/Touching assist Assistive device: Walker-rolling   Walk 50 feet activity   Assist Walk 50 feet with 2 turns activity did not occur: Safety/medical concerns  Assist level: Contact Guard/Touching assist Assistive device: Walker-rolling    Walk 150 feet activity   Assist Walk 150 feet activity did not occur: Safety/medical concerns  Assist level: Contact Guard/Touching assist Assistive device: Walker-rolling    Walk 10 feet on uneven surface  activity   Assist Walk 10 feet on uneven surfaces activity did not occur: Safety/medical concerns   Assist level: Contact Guard/Touching assist Assistive device: Walker-rolling   Wheelchair     Assist Is the patient using a wheelchair?: Yes Type of Wheelchair: Power    Wheelchair assist level: Independent Max wheelchair distance: >500    Wheelchair 50 feet with 2 turns activity    Assist        Assist Level: Independent   Wheelchair 150 feet activity     Assist      Assist Level: Independent   Blood pressure 108/68, pulse 88, temperature 98 F (36.7 C), temperature source Oral, resp.  rate 17, height 6\' 1"  (1.854 m), weight 88.4 kg, SpO2 99%.  Medical Problem List and Plan: 1. 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  Antithrombotics: -DVT/anticoagulation:  Mechanical: Sequential compression devices, below knee Bilateral lower extremities             -antiplatelet therapy: N/A--no antiplatelet due to hx of recurrent GIB  4/25- d/w family- they agree that Lovenox  not appropriate at this time.   -06/09/23 ordered dopplers to r/o clots given low grade temps recently  4/28- LE Dopplers (-) for DVT 3. Pain Management:  oxycodone  prn. Gabapentin  300 mg/hs.              --has Flexeril  10 mg TID prn              - patient with intermittent, severe b/l headache - add topomax 25 mg BID    4/19- pt rating pain 07/19/08 which is "tolerable" 5/7- got Ortho to come and take fluid off R knee- got 100cc off- pain is no more than 1/10 this AM! 5/8-5/9 knee still doing well 5/13-5/14-  pain controlled- allowed him to walk further yesterday in therapy- still cannot do more than 1 step 5/16- will get R hip xray due to clunk and R hi pain -06/29/23 R hip xray with cystic changes of femoral head/neck, also degeneration on my review- will need ortho f/up but doubt need for emergent consultation. Dr. Christiane Cowing is his primary ortho 5/20- Pt's Ortho responded and knows about Deg arthritis in R hip- but cannot address while in hospital  5/26- no change on R hip pain 5/27- still bad and now L knee buckling- placed L knee brace 4. Mood/Behavior/Sleep: LCSW to follow for evaluation and support             -antipsychotic  agents: N/A             --melatonin prn for insomnia.  -06/09/23 poor sleep, d/t interruptions, asked nursing to minimize interruptions between 11p-5a-- sleeping better lately 5. Neuropsych/cognition: This patient is capable of making decisions on his own behalf. 5/8- Ox3 and appropriate 6. Skin/Wound Care: Routine pressure relief measures.   4/19- due to ulcers on legs, on low air loss mattress 7. Fluids/Electrolytes/Nutrition: Strict I/O. Daily weights. Labs with HD. 8. ESRD- HD MWF: Schedule HD at the end of the day to help with tolerance of therapy             --Sensipar  and Renvela  for metabolic bone disease.   - Patient reports no longer makes urine  5/13 - last HD yesterday 5/15- last HD yesterday=- they were not able to finish due to equipment issues- will need to monitor fluid intake today- so can make it til tomorrow- they are not doing more today.  5/28- HD today 9. Anemia of chronic disease: Monitor H/H, aranesp  5/9- Hb 7.6 in last 24 hours- done yesterday- feels better- no dizziness or fatigue- so doesn't feel transfusion is appropriate 5/12 HGB up to 8.1 , improved continue to monitor.  He is on Aranesp  per nephrology 5/15- pt needs to have CBC rechecked in HD tomorrow-  -06/29/23 Hgb 7.8, stable. Monitor.  5/20- Hb 8.0 5/21- refused labs this AM since  getting in HD- overall labs looking better 2 days ago 5/22- Hb 9.5 this AM!! 5/28- Labs today? HD today 5/29- Hb 9.3- stable 10. H/o recurrent GIB/[employer]- will only take blood products in absolute emergency per Renal note: Continue Protonix  BID 11. H/o Hypotension: Has had drop  in BP with HD. Now cervical SCI.  --Monitor for symptoms day after HD and with activity. -4/18 baseline blood pressure stable, monitor with activity 4/19- BP actually slightly elevated today- 140s-150's- con't regimen, since can drop with HD and therapy 4/20- BP actually doing better today- in 100s'110s- however HR elevated, likely to fluid  overload 4/21 nephrology started midodrine  10mg  before dialysis, BP appears to be doing a little better today 5/8- BP on low side in last 24 hours- likely made worse due to HD yesterday- denies OH Sx's. Con't to monitor trend 5/9- BP is running in 90's systolic- doesn't feel dizzy or lightheaded sitting, but could drop when standing-will d/w therapy 5/22- 5/25- 5/26- Drop in BP yesterday with standing to 90's systolic- felt dizzy- will on Midodrine - will wait to increase 5/27- 5/29- tolerating therapy without drops in BP 5/30-31  BP doing better- con't regimen Vitals:   07/12/23 1400 07/12/23 1432 07/12/23 1445 07/12/23 1500  BP: 105/71 (!) 94/54 102/78 98/61   07/12/23 1530 07/12/23 1603 07/12/23 1631 07/12/23 1701  BP: 101/60 102/64 (!) 101/48 (!) 115/102   07/12/23 1706 07/12/23 1800 07/12/23 2021 07/13/23 0502  BP: 99/86 111/70 (!) 99/59 108/68     12. Constipation: Small BM on 04/14 --had of nulytey yesterday and another today? -Xray 4/17 without excessive stool, patient reports stool still little hard yesterday.  Will increase MiraLAX  to 3 times a day, continue Senokot twice a day. Suppository PRN   06/09/23 LBM yesterday, cont regimen 4/28- LBM overnight- has been refusing miralax - made it prn- taking metamucil 07/13/23 LBM last night per pt 13. ?left knee effusion. Not currently bothersome. Consider imaging.  14.  SIRS- Fever/hypotension/tachycardia - 4/21 Currently afebrile, he got to 50 cc IVF yesterday, CXR negative, EKG sinus tachycardia. Patient was started on Maxipime  and vancomycin  yesterday.  Appears to be doing much better today.  WBC not elevated at 7.1.  Continue to follow blood cultures 4/22- Blood Cx's (-) so far- WBC 7.3- will con't IV ABX until know final results of blood Cx's- CXR (-)- but temp was 103.2 yesterday overnight.  4/23- Blood C'x (-) x2 days so far- T 99.9 this Am after a few sips of coffee- was 100.4 this AM- will con't IV ABX since concerned about  possible bacteremia- esp since having low grade temps still 4/24- No more fevers, even low grade- no growth on blood Cx' bu it says that didn't get enough blood, so much be falsely negative 4/25- No growth for 5 days, but it also said not enough blood to be sure was a true negative. Will change stop date til tomorrow -06/08/23 at 3am temp recorded as 100.3 but 5am was 98.3; will get CBC w/diff, monitor temps closely today, if continued low grade temps, will reach out to ID. BCx NGTD x5d. Didn't get vanc yesterday? Or cefepime ? Unclear why. Cefepime  started this morning at 0011. -06/09/23 temp 100.3 at 3am yesterday and then 100 later in the day, but no fevers since, no tachycardia.  -WBC yesterday 8.9.  -Now with some confusion; spoke with Dr. Levern Reader of ID, stated cefepime  could be the cause of the confusion -recommended we stop all abx (which ended yesterday anyway) -  watch the patient OFF abx -if fevers >100.3 occur then she recommended repeat BCx and CXR and could call her back to see if any abx would be advised at that time.  -Head CT and dopplers ordered as well, as above 4/28- CT looks good- no fever, WBC is OK- off ABX 4/29- Also Dopplers (-) feeling good this AM 06/16/23 seems to have resolved. 15. Tachycardia- resolved 4/22- doing better- right at 100 this AM- will monitor- likely can be from dregs of infection vs OH. -will monitor  4/23- Tachycardia improving- 4/24-27 HR in 90's right now- doing better  4/29- 4/30- HR running in 90s-low 100's today 5/12 heart rate in the 70s and 80s primarily, continue to monitor 5/20- HR back into low 100's- but is feeling good- denies Sx's.  5/22- back to running 80s-high 90's- will monitor trend 5/29-31 HR in 80s-90's in last few days- doing better    07/13/2023    5:02 AM 07/12/2023    8:21 PM 07/12/2023    6:00 PM  Vitals with BMI  Weight 194 lbs 14 oz    Systolic 108 99 111  Diastolic 68 59 70  Pulse 88 97 95    16. R hand swelling  4/23-  doing ice- will do xrays 4/24- Xray doesn't look like cause of swelling, however could be due to new shunt? 4/30- resolved 17. Hypocalcemia 4/24- his Albumin  is 2.1, so probably ok, and phos coming down- will leave to renal- thank you for their help  5/1- Ca 9.3 5/8- Ca 8.8 5/15- Ca 8.5 18. Hyperkalemia 4/28- K+ 6.3 today- got hypoosmotic bath to reduce K+ per note from RN/HD nurse? Also has Na of 127- being addressed by HD/renal- Phos also 5.8- but again, getting HD today 5/1- K+ down to 5.6 and Na 129 5/6- Na 133 and K+ down to 4.7 from 5.6 5/7- refused labs this AM- wants in HD 5/9- K_ came back as 4.5 5/12 potassium elevated today, chronic issue has dialysis today.  Treatment per renal team 5/14- K+ down to 5.7- from 6.2- has HD today 06/29/23 down to 5.3 yesterday, had dialysis yesterday.  5/19- HD today 5/20- K+ stayed at 5.3- doing better 5/24- K+ 4.5 yesterday- best it's been 5/27- K+ 5.3- per renal 5/29- K+ 5.1 with HD yesterday 19. R>L knee pain 4/29- will ask Ortho if they will tap his R knee- I have no problem doing injection of knee, however with so much fluid, I would rather Ortho tap him and then do steroid injection.   4/30- we added Prednisone  40 mg daily x 3 days- to see if helps knee pain  5/1- mild reduction in pain so far 5/6- Pt has short term improvement from Prednisone  dosing for knees- con't percocet prn 5/7- since pt was doing so much worse with therapy, asked Ortho to come and tap knee and take off fluid- do steroid injection- they drew off 100CC- 5 different draws required- fluid is pending, but has a lot of WBCs- no organisms on gram stain.   5/8- no growth for 24 hours- R knee pain so much better  -06/22/23 NGTD x3 days, monitor  5/14- No growth  - pain so much better after 100cc fluid removed and got injection 20.  R hip pain 5/16- will get R hip xray- also c/w trochanteric bursitis on exam- if doesn't improve, will do trochanteric bursa injection  Tuesday -06/29/23 R hip xray with cystic changes of femoral head/neck, also degeneration on my review- will  need ortho f/up but doubt need for emergent consultation. Dr. Christiane Cowing is his primary ortho.  5/20- Dr Christiane Cowing wrote that he has severe arthritis, but needs to be out of hospital to get hip replacement.  5/29- had severe pain in R hip last night at 1:30am- is better after pain meds    LOS: 44 days A FACE TO FACE EVALUATION WAS PERFORMED  [address] 07/13/2023, 1:31 PM

## 2023-07-14 DIAGNOSIS — M25551 Pain in right hip: Secondary | ICD-10-CM | POA: Insufficient documentation

## 2023-07-14 DIAGNOSIS — M25461 Effusion, right knee: Secondary | ICD-10-CM

## 2023-07-14 MED ORDER — CALCITRIOL 0.5 MCG PO CAPS: 1.5000 ug | ORAL_CAPSULE | ORAL | Status: DC

## 2023-07-14 MED ORDER — TOPIRAMATE 25 MG PO TABS: 25.0000 mg | ORAL_TABLET | Freq: Two times a day (BID) | ORAL | Status: DC

## 2023-07-14 MED ORDER — PSYLLIUM 95 % PO PACK: 1.0000 | PACK | Freq: Three times a day (TID) | ORAL | Status: DC

## 2023-07-14 MED ORDER — ACETAMINOPHEN 325 MG PO TABS: 325.0000 mg | ORAL_TABLET | ORAL | Status: DC | PRN

## 2023-07-14 MED ORDER — MIDODRINE HCL 10 MG PO TABS: 10.0000 mg | ORAL_TABLET | ORAL | Status: DC | PRN

## 2023-07-14 MED ORDER — OXYCODONE-ACETAMINOPHEN 5-325 MG PO TABS: 1.0000 | ORAL_TABLET | ORAL | 0 refills | Status: DC | PRN

## 2023-07-14 MED ORDER — POLYETHYLENE GLYCOL 3350 17 GM/SCOOP PO POWD: 17.0000 g | Freq: Two times a day (BID) | ORAL | Status: DC | PRN

## 2023-07-14 MED ORDER — MELATONIN 5 MG PO TABS: 5.0000 mg | ORAL_TABLET | Freq: Every evening | ORAL | Status: DC | PRN

## 2023-07-14 MED ORDER — DIPHENHYDRAMINE HCL 25 MG PO CAPS: 25.0000 mg | ORAL_CAPSULE | Freq: Four times a day (QID) | ORAL | Status: AC | PRN

## 2023-07-14 MED ORDER — GERHARDT'S BUTT CREAM: 1.0000 | TOPICAL_CREAM | Freq: Two times a day (BID) | CUTANEOUS | Status: DC

## 2023-07-14 NOTE — Progress Notes (Signed)
 Physical Therapy Session Note  Patient Details  Name: Carl Keenum Sr. MRN: [id number] Date of Birth: January 31, 1961  Today's Date: 07/14/2023 PT Individual Time: 1000-1030 PT Individual Time Calculation (min): 30 min   Short Term Goals: Week 1:  PT Short Term Goal 1 (Week 1): pt will perform STS with RW with assist of 1 PT Short Term Goal 1 - Progress (Week 1): Not met PT Short Term Goal 2 (Week 1): Pt will perform stand pivot with LRAD with assist of 1 PT Short Term Goal 2 - Progress (Week 1): Not met PT Short Term Goal 3 (Week 1): pt will initiate gait training PT Short Term Goal 3 - Progress (Week 1): Not met  Skilled Therapeutic Interventions/Progress Updates:  Pt was seen bedside in the am on edge of bed. Pt was able to don pants and shoes with S and increased time on edge of bed. Pt transferred sit to stand with S. Pt transferred to w/c with rolling walker and S to contact guard. Pt drove power w/c independently around unit. Pt left sititng up in power w/c with all needs within reach at end of treatment.   Therapy Documentation Precautions:  Precautions Precautions: Fall, Cervical, Other (comment) Recall of Precautions/Restrictions: Impaired Precaution/Restrictions Comments: Verbally reviewed cervical precautions. Required Braces or Orthoses: Cervical Brace Cervical Brace: Soft collar, At all times Restrictions Weight Bearing Restrictions Per Provider Order: No General:   Pain: No c/o pain.     Therapy/Group: Individual Therapy  [name] 07/14/2023, 12:23 PM

## 2023-07-14 NOTE — Progress Notes (Signed)
 [facility] Progress Note   Subjective:    Seen in room. No new complaints. Looking forward to discharge.   Objective Vitals:   07/13/23 2005 07/14/23 0330 07/14/23 0643 07/14/23 1339  BP: 113/69 115/89  107/77  Pulse: 65 81  85  Resp: 18 17  18   Temp: 98.4 F (36.9 C) 98.7 F (37.1 C)  97.7 F (36.5 C)  TempSrc: Oral Oral    SpO2: 100% 100%  100%  Weight:   87.9 kg   Height:       Physical Exam General: Alert male in NAD Lungs: Respirations unlabored on RA Abdomen: Non-distended Extremities: No edema b/l lower extremities Dialysis Access: RUE AVF + t/b  Additional Objective Labs: Basic Metabolic Panel: Recent Labs  Lab 07/08/23 1343 07/10/23 1351 07/12/23 1100  NA 131* 131* 128*  K 5.3* 5.1 4.7  CL 91* 93* 89*  CO2 21* 22 22  GLUCOSE 110* 119* 124*  BUN 75* 67* 60*  CREATININE 9.65* 9.42* 8.57*  CALCIUM  8.2* 8.3* 8.2*  PHOS 4.9* 4.9* 5.0*   Liver Function Tests: Recent Labs  Lab 07/08/23 1343 07/10/23 1351 07/12/23 1100  ALBUMIN  2.9* 2.8* 2.8*   No results for input(s): "LIPASE", "AMYLASE" in the last 168 hours. CBC: Recent Labs  Lab 07/08/23 1343 07/10/23 1351 07/12/23 1100  WBC 5.5 5.7 5.9  HGB 9.4* 9.3* 10.0*  HCT 29.8* 29.7* 31.0*  MCV 89.0 89.2 88.1  PLT 270 237 255   Blood Culture    Component Value Date/Time   SDES FLUID 06/18/2023 1357   SPECREQUEST NONE 06/18/2023 1357   CULT  06/18/2023 1357    NO GROWTH 3 DAYS Performed at [facility], [address], [place], [place] [address]    REPTSTATUS 06/22/2023 FINAL 06/18/2023 1357    Cardiac Enzymes: No results for input(s): "CKTOTAL", "CKMB", "CKMBINDEX", "TROPONINI" in the last 168 hours. CBG: No results for input(s): "GLUCAP" in the last 168 hours. Iron Studies: No results for input(s): "IRON", "TIBC", "TRANSFERRIN", "FERRITIN" in the last 72 hours. @lablastinr3 @ Studies/Results: No results found. Medications:   (feeding supplement) PROSource Plus   30 mL Oral BID BM   atorvastatin   10 mg Oral Q Thu   calcitRIOL   1.5 mcg Oral Q M,W,F-HD   carbamide peroxide  5 drop Right EAR BID   Chlorhexidine  Gluconate Cloth  6 each Topical Q0600   cinacalcet   120 mg Oral Q supper   darbepoetin (ARANESP ) injection - DIALYSIS  200 mcg Subcutaneous Q Tue-1800   gabapentin   300 mg Oral QHS   Gerhardt's butt cream   Topical BID   pantoprazole   40 mg Oral BID AC   psyllium  1 packet Oral TID   senna-docusate  2 tablet Oral BID   sevelamer  carbonate  3,200 mg Oral TID WC   sodium zirconium cyclosilicate   10 g Oral Daily   topiramate   25 mg Oral BID    Dialysis Orders: OP Dialysis Orders [facility] - MWF 4h  B400   85.5kg   2K bath  AVF   - Heparin  3000 units   - Mircera 60mcg IV q 2 weeks- last dose 05/24/23  - Calcitriol  1.5mcg PO q HD  Assessment/Plan: 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  Continue on schedule. HD 1st shift Monday.  Hyperkalemia:Chronic issue.  On Lokelma  daily now. Continue to reinforce renal diet. HTN/volume: Tolerates high UF goals, 3.5-4L. Will need lower  EDW at discharge Anemia of ESRD:  Hgb 9.3. We have ordered to further increase to aranesp  200 mcg weekly on Tuesdays  Secondary HPTH: Calcium  and phos at goal. Continue sevelamer  and sensipar . Resumed calcitriol   Nutrition: continue protein supplements. Patient reports appetite is better Dispo - For discharge to [facility] Monday.   Elona Hal PA-C [facility] 07/14/2023,1:57 PM

## 2023-07-14 NOTE — Plan of Care (Signed)
  Problem: RH Balance Goal: LTG: Patient will maintain dynamic sitting balance (OT) Description: LTG:  Patient will maintain dynamic sitting balance with assistance during activities of daily living (OT) Outcome: Completed/Met Flowsheets (Taken 05/31/2023 1819 by Burleigh Carp, OT) LTG: Pt will maintain dynamic sitting balance during ADLs with: Independent with assistive device   Problem: RH Eating Goal: LTG Patient will perform eating w/assist, cues/equip (OT) Description: LTG: Patient will perform eating with assist, with/without cues using equipment (OT) Outcome: Completed/Met Flowsheets (Taken 05/31/2023 1819 by Burleigh Carp, OT) LTG: Pt will perform eating with assistance level of: Independent with assistive device    Problem: RH Grooming Goal: LTG Patient will perform grooming w/assist,cues/equip (OT) Description: LTG: Patient will perform grooming with assist, with/without cues using equipment (OT) Outcome: Completed/Met Flowsheets (Taken 05/31/2023 1819 by Burleigh Carp, OT) LTG: Pt will perform grooming with assistance level of: Independent with assistive device    Problem: RH Bathing Goal: LTG Patient will bathe all body parts with assist levels (OT) Description: LTG: Patient will bathe all body parts with assist levels (OT) Outcome: Completed/Met Flowsheets (Taken 07/14/2023 1617) LTG: Pt will perform bathing with assistance level/cueing: Supervision/Verbal cueing   Problem: RH Dressing Goal: LTG Patient will perform upper body dressing (OT) Description: LTG Patient will perform upper body dressing with assist, with/without cues (OT). Outcome: Completed/Met Flowsheets (Taken 05/31/2023 1819 by Burleigh Carp, OT) LTG: Pt will perform upper body dressing with assistance level of:  Independent with assistive device  Not including orthosis Goal: LTG Patient will perform lower body dressing w/assist (OT) Description: LTG: Patient will perform  lower body dressing with assist, with/without cues in positioning using equipment (OT) Outcome: Completed/Met Flowsheets (Taken 07/14/2023 1617) LTG: Pt will perform lower body dressing with assistance level of: Supervision/Verbal cueing   Problem: RH Toileting Goal: LTG Patient will perform toileting task (3/3 steps) with assistance level (OT) Description: LTG: Patient will perform toileting task (3/3 steps) with assistance level (OT)  Outcome: Completed/Met Flowsheets (Taken 07/14/2023 1617) LTG: Pt will perform toileting task (3/3 steps) with assistance level: Supervision/Verbal cueing   Problem: RH Functional Use of Upper Extremity Goal: LTG Patient will use RT/LT upper extremity as a (OT) Description: LTG: Patient will use right/left upper extremity as a stabilizer/gross assist/diminished/nondominant/dominant level with assist, with/without cues during functional activity (OT) Outcome: Completed/Met Flowsheets (Taken 05/31/2023 1819 by Burleigh Carp, OT) LTG: Use of upper extremity in functional activities:  RUE as gross assist level  LUE as diminished level LTG: Pt will use upper extremity in functional activity with assistance level of: Independent with assistive device   Problem: RH Toilet Transfers Goal: LTG Patient will perform toilet transfers w/assist (OT) Description: LTG: Patient will perform toilet transfers with assist, with/without cues using equipment (OT) Outcome: Completed/Met Flowsheets (Taken 07/14/2023 1617) LTG: Pt will perform toilet transfers with assistance level of: Supervision/Verbal cueing   Problem: RH Tub/Shower Transfers Goal: LTG Patient will perform tub/shower transfers w/assist (OT) Description: LTG: Patient will perform tub/shower transfers with assist, with/without cues using equipment (OT) Outcome: Completed/Met Flowsheets (Taken 07/14/2023 1617) LTG: Pt will perform tub/shower stall transfers with assistance level of: Supervision/Verbal  cueing

## 2023-07-14 NOTE — Progress Notes (Signed)
 PROGRESS NOTE   Subjective/Complaints:  Pt doing well again today. Slept ok, pain well managed but R knee getting swollen again and wants to know if he can get it aspirated again. LBM this morning. No other complaints or concerns.   ROS: -   Pt denies SOB, abd pain, CP, N/V/C/D, and vision changes   B/L R>L knee pain (+)- R knee bothersome again +R hip pain-managed but severe arthritis  Objective:   No results found.      Recent Labs    07/12/23 1100  WBC 5.9  HGB 10.0*  HCT 31.0*  PLT 255     Recent Labs    07/12/23 1100  NA 128*  K 4.7  CL 89*  CO2 22  GLUCOSE 124*  BUN 60*  CREATININE 8.57*  CALCIUM  8.2*     Intake/Output Summary (Last 24 hours) at 07/14/2023 1030 Last data filed at 07/14/2023 1610 Gross per 24 hour  Intake 220 ml  Output --  Net 220 ml        Physical Exam: Vital Signs Blood pressure 115/89, pulse 81, temperature 98.7 F (37.1 C), temperature source Oral, resp. rate 17, height 6\' 1"  (1.854 m), weight 87.9 kg, SpO2 100%.   General: awake, alert, appropriate, resting in bed; NAD HENT: conjugate gaze; oropharynx moist CV: regular rate and rhythm; no JVD Pulmonary: CTA B/L; no W/R/R- good air movement GI: soft, NT, ND, (+)BS Psychiatric: appropriate- bright affect Neurological: Ox3 MsK: R knee with mild effusion again, no warmth/erythema, minimal tenderness, knee sleeve applied.     PRIOR EXAMS: MSK- R lateral and groin TTP- c/w trochanteric bursitis and R joint pain  MSK: cannot make fist at all on L hand-  and can make fist on R hand, but only for 3 seconds.   Neurological: Ox3, follows commands, cranial nerves II through XII grossly intact,   MSK- R knee much less swollen MsK: B/L knees with effusions- less TTP on R knee this AM- looks good- mild effusion , no erythema, not really focally warm to touch- Both have effusions, - R hand swelling looks  good/resolved  MSK- R hand swelling esp ulnar side of hand/dorsum and L wrist-stable this AM RUE- Biceps 4/5; WE 4-/5; Triceps 2/5; Grip 2-/5 and FA 1/5 LUE- 4/5 except triceps 3+ to 4-/5 RLE- HF 1 to 2-/5; KE 2-/5; DF/PF 2-/5 LLE- HF 3/5; KE 4-/5; DF/PF 4/5    Skin: C/D/I. Multiple lesions on BL legs with various stages of healing.  + RUE fistula - intact   MSK:      LLE knee with palpable effusion, no warmth, no ttp Mood: Pleasant affect, appropriate mood.  Neurologic exam: Alert and oriented x 4, fluent, follows commands, cranial nerves II through XII grossly intact, no hypertonia noted  Sensation: Reduced to light touch in distal right lower extremity to the mid-thigh       Strength:                RUE: 2/5 SA, 2/5 EF, 2/5 EE, 2/5 WE, 2/5 FF, 2/5 FA                LUE:  3/5 SA,  3/5 EF, 3/5 EE, 3/5 WE, 3/5 FF, 3/5 FA                RLE: 2/5 HF, 2/5 KE, 2/5  DF, 2/5  EHL, 2/5  PF                 LLE:  3/5 HF, 4/5 KE, 4/5  DF, 4/5  EHL, 4/5  PF  Neuroexam stable 4/21  Assessment/Plan: 1. Functional deficits which require 3+ hours per day of interdisciplinary therapy in a comprehensive inpatient rehab setting. Physiatrist is providing close team supervision and 24 hour management of active medical problems listed below. Physiatrist and rehab team continue to assess barriers to discharge/monitor patient progress toward functional and medical goals  Care Tool:  Bathing    Body parts bathed by patient: Right arm, Left arm, Chest, Abdomen, Front perineal area, Buttocks, Face, Left lower leg, Right lower leg, Left upper leg, Right upper leg   Body parts bathed by helper: Left arm, Front perineal area, Buttocks, Right lower leg, Left lower leg     Bathing assist Assist Level: Supervision/Verbal cueing     Upper Body Dressing/Undressing Upper body dressing   What is the patient wearing?: Pull over shirt    Upper body assist Assist Level: Independent with assistive device     Lower Body Dressing/Undressing Lower body dressing      What is the patient wearing?: Pants, Underwear/pull up     Lower body assist Assist for lower body dressing: Supervision/Verbal cueing     Toileting Toileting Toileting Activity did not occur (Clothing management and hygiene only): N/A (no void or bm) (anuric, HD patient)  Toileting assist Assist for toileting: Supervision/Verbal cueing     Transfers Chair/bed transfer  Transfers assist     Chair/bed transfer assist level: Contact Guard/Touching assist     Locomotion Ambulation   Ambulation assist   Ambulation activity did not occur: Safety/medical concerns  Assist level: Contact Guard/Touching assist Assistive device: Walker-rolling Max distance: 150   Walk 10 feet activity   Assist  Walk 10 feet activity did not occur: Safety/medical concerns  Assist level: Contact Guard/Touching assist Assistive device: Walker-rolling   Walk 50 feet activity   Assist Walk 50 feet with 2 turns activity did not occur: Safety/medical concerns  Assist level: Contact Guard/Touching assist Assistive device: Walker-rolling    Walk 150 feet activity   Assist Walk 150 feet activity did not occur: Safety/medical concerns  Assist level: Contact Guard/Touching assist Assistive device: Walker-rolling    Walk 10 feet on uneven surface  activity   Assist Walk 10 feet on uneven surfaces activity did not occur: Safety/medical concerns   Assist level: Contact Guard/Touching assist Assistive device: Walker-rolling   Wheelchair     Assist Is the patient using a wheelchair?: Yes Type of Wheelchair: Power    Wheelchair assist level: Independent Max wheelchair distance: >500    Wheelchair 50 feet with 2 turns activity    Assist        Assist Level: Independent   Wheelchair 150 feet activity     Assist      Assist Level: Independent   Blood pressure 115/89, pulse 81, temperature 98.7 F  (37.1 C), temperature source Oral, resp. rate 17, height 6\' 1"  (1.854 m), weight 87.9 kg, SpO2 100%.  Medical Problem List and Plan: 1. 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  Antithrombotics: -DVT/anticoagulation:  Mechanical: Sequential compression devices, below knee Bilateral lower extremities             -antiplatelet therapy: N/A--no antiplatelet due to hx of recurrent GIB  4/25- d/w family- they agree that Lovenox  not appropriate at this time.   -06/09/23 ordered dopplers to r/o clots given low grade temps recently  4/28- LE Dopplers (-) for DVT 3. Pain Management:  oxycodone  prn. Gabapentin  300 mg/hs.              --has Flexeril  10 mg TID prn              - patient with intermittent, severe b/l headache - add topomax 25 mg BID    4/19- pt rating pain 07/19/08 which is "tolerable" 5/7- got Ortho to come and take fluid off R knee- got 100cc off- pain is no more than 1/10 this AM! 5/8-5/9 knee still doing well 5/13-5/14-  pain controlled- allowed him to walk further yesterday in therapy- still cannot do more than 1 step 5/16- will get R hip xray due to clunk and R hi pain -06/29/23 R hip xray with cystic changes of femoral head/neck, also degeneration on my review- will need ortho f/up but doubt need for emergent consultation. Dr. Christiane Cowing is his primary ortho 5/20- Pt's Ortho responded and knows about Deg arthritis in R hip- but cannot address while in hospital  5/26- no change on R hip pain 5/27- still bad and now L knee buckling- placed L knee brace -07/14/23 R knee effusion returning, spoke with  Starr Eddy PA-C of ortho, too soon to inject (last one 06/18/23) and aspiration alone wouldn't be very beneficial-- discussed possibility of PO steroids again, pt doesn't think it's needed yet.  4. Mood/Behavior/Sleep: LCSW to follow for evaluation and support             -antipsychotic agents: N/A             --melatonin prn for insomnia.  -06/09/23 poor sleep, d/t interruptions, asked nursing to minimize interruptions between 11p-5a-- sleeping better lately 5. Neuropsych/cognition: This patient is capable of making decisions on his own behalf. 5/8- Ox3 and appropriate 6. Skin/Wound Care: Routine pressure relief measures.   4/19- due to ulcers on legs, on low air loss mattress 7. Fluids/Electrolytes/Nutrition: Strict I/O. Daily weights. Labs with HD. 8. ESRD- HD MWF: Schedule HD at the end of the day to help with tolerance of therapy             --Sensipar  and Renvela  for metabolic bone disease.   - Patient reports no longer makes urine  5/13 - last HD yesterday 5/15- last HD yesterday=- they were not able to finish due to equipment issues- will need to monitor fluid intake today- so can make it til tomorrow- they are not doing more today.  5/28- HD today 9. Anemia of chronic disease: Monitor H/H, aranesp  5/9- Hb 7.6 in last 24 hours- done yesterday- feels better- no dizziness or fatigue- so doesn't feel transfusion is appropriate 5/12 HGB up  to 8.1 , improved continue to monitor.  He is on Aranesp  per nephrology 5/15- pt needs to have CBC rechecked in HD tomorrow-  -06/29/23 Hgb 7.8, stable. Monitor.  5/20- Hb 8.0 5/21- refused labs this AM since getting in HD- overall labs looking better 2 days ago 5/22- Hb 9.5 this AM!! 5/28- Labs today? HD today 5/29- Hb 9.3- stable>> 10.0 5/30 10. H/o recurrent GIB/[employer]- will only take blood products in absolute emergency per Renal note: Continue Protonix  BID 11. H/o Hypotension: Has had drop  in BP with HD. Now cervical SCI.   --Monitor for symptoms day after HD and with activity. -4/18 baseline blood pressure stable, monitor with activity 4/19- BP actually slightly elevated today- 140s-150's- con't regimen, since can drop with HD and therapy 4/20- BP actually doing better today- in 100s'110s- however HR elevated, likely to fluid overload 4/21 nephrology started midodrine  10mg  before dialysis, BP appears to be doing a little better today 5/8- BP on low side in last 24 hours- likely made worse due to HD yesterday- denies OH Sx's. Con't to monitor trend 5/9- BP is running in 90's systolic- doesn't feel dizzy or lightheaded sitting, but could drop when standing-will d/w therapy 5/22- 5/25- 5/26- Drop in BP yesterday with standing to 90's systolic- felt dizzy- will on Midodrine - will wait to increase 5/27- 5/29- tolerating therapy without drops in BP 5/30-6/1  BP doing better- con't regimen Vitals:   07/12/23 1500 07/12/23 1530 07/12/23 1603 07/12/23 1631  BP: 98/61 101/60 102/64 (!) 101/48   07/12/23 1701 07/12/23 1706 07/12/23 1800 07/12/23 2021  BP: (!) 115/102 99/86 111/70 (!) 99/59   07/13/23 0502 07/13/23 1737 07/13/23 2005 07/14/23 0330  BP: 108/68 101/68 113/69 115/89     12. Constipation: Small BM on 04/14 --had of nulytey yesterday and another today? -Xray 4/17 without excessive stool, patient reports stool still little hard yesterday.  Will increase MiraLAX  to 3 times a day, continue Senokot twice a day. Suppository PRN   06/09/23 LBM yesterday, cont regimen 4/28- LBM overnight- has been refusing miralax - made it prn- taking metamucil -07/14/23 LBM this morning 13. ?left knee effusion. Not currently bothersome. Consider imaging.  14.  SIRS- Fever/hypotension/tachycardia - 4/21 Currently afebrile, he got to 50 cc IVF yesterday, CXR negative, EKG sinus tachycardia. Patient was started on Maxipime  and vancomycin  yesterday.  Appears to be doing much better today.  WBC not elevated at 7.1.  Continue to  follow blood cultures 4/22- Blood Cx's (-) so far- WBC 7.3- will con't IV ABX until know final results of blood Cx's- CXR (-)- but temp was 103.2 yesterday overnight.  4/23- Blood C'x (-) x2 days so far- T 99.9 this Am after a few sips of coffee- was 100.4 this AM- will con't IV ABX since concerned about possible bacteremia- esp since having low grade temps still 4/24- No more fevers, even low grade- no growth on blood Cx' bu it says that didn't get enough blood, so much be falsely negative 4/25- No growth for 5 days, but it also said not enough blood to be sure was a true negative. Will change stop date til tomorrow -06/08/23 at 3am temp recorded as 100.3 but 5am was 98.3; will get CBC w/diff, monitor temps closely today, if continued low grade temps, will reach out to ID. BCx NGTD x5d. Didn't get vanc yesterday? Or cefepime ? Unclear why. Cefepime  started this morning at 0011. -06/09/23 temp 100.3 at 3am yesterday and then 100 later in the day,  but no fevers since, no tachycardia.  -WBC yesterday 8.9.  -Now with some confusion; spoke with Dr. Levern Reader of ID, stated cefepime  could be the cause of the confusion -recommended we stop all abx (which ended yesterday anyway) -watch the patient OFF abx -if fevers >100.3 occur then she recommended repeat BCx and CXR and could call her back to see if any abx would be advised at that time.  -Head CT and dopplers ordered as well, as above 4/28- CT looks good- no fever, WBC is OK- off ABX 4/29- Also Dopplers (-) feeling good this AM 06/16/23 seems to have resolved. 15. Tachycardia- resolved 4/22- doing better- right at 100 this AM- will monitor- likely can be from dregs of infection vs OH. -will monitor  4/23- Tachycardia improving- 4/24-27 HR in 90's right now- doing better  4/29- 4/30- HR running in 90s-low 100's today 5/12 heart rate in the 70s and 80s primarily, continue to monitor 5/20- HR back into low 100's- but is feeling good- denies Sx's.  5/22- back  to running 80s-high 90's- will monitor trend 5/29-31 HR in 80s-90's in last few days- doing better    07/14/2023    6:43 AM 07/14/2023    3:30 AM 07/13/2023    8:05 PM  Vitals with BMI  Weight 193 lbs 13 oz    Systolic  115 113  Diastolic  89 69  Pulse  81 65    16. R hand swelling  4/23- doing ice- will do xrays 4/24- Xray doesn't look like cause of swelling, however could be due to new shunt? 4/30- resolved 17. Hypocalcemia 4/24- his Albumin  is 2.1, so probably ok, and phos coming down- will leave to renal- thank you for their help  5/1- Ca 9.3 5/8- Ca 8.8 5/15- Ca 8.5 18. Hyperkalemia 4/28- K+ 6.3 today- got hypoosmotic bath to reduce K+ per note from RN/HD nurse? Also has Na of 127- being addressed by HD/renal- Phos also 5.8- but again, getting HD today 5/1- K+ down to 5.6 and Na 129 5/6- Na 133 and K+ down to 4.7 from 5.6 5/7- refused labs this AM- wants in HD 5/9- K_ came back as 4.5 5/12 potassium elevated today, chronic issue has dialysis today.  Treatment per renal team 5/14- K+ down to 5.7- from 6.2- has HD today 06/29/23 down to 5.3 yesterday, had dialysis yesterday.  5/19- HD today 5/20- K+ stayed at 5.3- doing better 5/24- K+ 4.5 yesterday- best it's been 5/27- K+ 5.3- per renal 5/29- K+ 5.1 with HD yesterday 19. R>L knee pain 4/29- will ask Ortho if they will tap his R knee- I have no problem doing injection of knee, however with so much fluid, I would rather Ortho tap him and then do steroid injection.   4/30- we added Prednisone  40 mg daily x 3 days- to see if helps knee pain  5/1- mild reduction in pain so far 5/6- Pt has short term improvement from Prednisone  dosing for knees- con't percocet prn 5/7- since pt was doing so much worse with therapy, asked Ortho to come and tap knee and take off fluid- do steroid injection- they drew off 100CC- 5 different draws required- fluid is pending, but has a lot of WBCs- no organisms on gram stain.   5/8- no growth for 24  hours- R knee pain so much better  -06/22/23 NGTD x3 days, monitor  5/14- No growth  - pain so much better after 100cc fluid removed and got injection -07/14/23 R knee  effusion returned, spoke with Starr Eddy PA-C, too soon for injection again (last done 06/18/23), and aspiration alone wouldn't be beneficial d/t likelihood of reaccumulation-- discussed knee sleeve use. Could consider steroid although not super helpful last time, so held off for now.  20.  R hip pain 5/16- will get R hip xray- also c/w trochanteric bursitis on exam- if doesn't improve, will do trochanteric bursa injection Tuesday -06/29/23 R hip xray with cystic changes of femoral head/neck, also degeneration on my review- will need ortho f/up but doubt need for emergent consultation. Dr. Christiane Cowing is his primary ortho.  5/20- Dr Christiane Cowing wrote that he has severe arthritis, but needs to be out of hospital to get hip replacement.  5/29- had severe pain in R hip last night at 1:30am- is better after pain meds    LOS: 45 days A FACE TO FACE EVALUATION WAS PERFORMED  [address] 07/14/2023, 10:30 AM

## 2023-07-14 NOTE — Progress Notes (Signed)
 Occupational Therapy Session Note  Patient Details  Name: Carl Heal Sr. MRN: [id number] Date of Birth: 02-05-1961  Today's Date: 07/14/2023 OT Individual Time: 0950-& 1410-1500 OT Individual Time Calculation (min): 10 min & 50 min   Short Term Goals: Week 6:  OT Short Term Goal 1 (Week 6): Pt will don footwear SBA OT Short Term Goal 2 (Week 6): Pt will complete sit to stand from 23" surface with CGA with LRAD in preparation for ADLs OT Short Term Goal 3 (Week 6): Pt will complete dynamic standing task at SBA with LRAD  Skilled Therapeutic Interventions/Progress Updates:      Therapy Documentation Precautions:  Precautions Precautions: Fall, Cervical, Other (comment) Recall of Precautions/Restrictions: Impaired Precaution/Restrictions Comments: Verbally reviewed cervical precautions. Required Braces or Orthoses: Cervical Brace Cervical Brace: Soft collar, At all times Restrictions Weight Bearing Restrictions Per Provider Order: No Session 1 General: "I am heading out tomorrow" Pt seated EOB upon OT arrival, agreeable to OT session.  Pain: no pain reported  ADL: OT providing skilled intervention on ADL retraining in order to increase independence with tasks and increase activity tolerance. Pt completed the following tasks at the current level of assist: UB dressing: mod I seated EOB donning/doffing overhead shirt LB dressing: SBA seated on EOB, able to complete figure 4 to don pants over feet then stand at RW to manage over waist  Pt seated in EOB at end of session with direct handoff to PT for session.   Session 2 General: "This was fun!" Pt seated in W/C upon OT arrival, agreeable to OT.  Pain: no pain reported  Exercises: Notified by nursing earlier in day that patient would like to go outside for therapy and spend time with another patient outside as well.    Transported patient off the unit in w/c to outside near [facility].    While outdoors, patient participated in  individualized OT treatment with another individualized PT/patient occurring. Pt instructed in therapeutic activities to challenge dynamic standing balance, UE strength/ROM and endurance. Games outdoors included horseshoe toss, badminton toss, and ball toss. Games occurred in both standing vs sitting positions with CGA for dynamic standing balance without RW and mod I for dynamic sitting balance. These patients enjoyed doing therapy together and interacting with one another to help boost mood and overall affect.    Pt seated in W/C at end of session with W/C alarm donned, call light within reach and 4Ps assessed.    Therapy/Group: Individual Therapy  [name], OTD, OTR/L 07/14/2023, 4:16 PM

## 2023-07-14 NOTE — Discharge Summary (Incomplete)
 Physician Discharge Summary  Patient ID: Carl Crabbe Sr. MRN: [id number] DOB/AGE: 1961/01/15 63 y.o.  Admit date: 05/30/2023 Discharge date: 07/14/2023  Discharge Diagnoses:  Principal Problem:   Spinal cord injury, cervical region, sequela (HCC) Active Problems:   Headache disorder   Secondary hyperparathyroidism of renal origin (HCC)   Hyperkalemia   ESRD (end stage renal disease) (HCC)   Iron deficiency anemia, unspecified   Type 2 diabetes mellitus with other specified complication (HCC)   Coping style affecting medical condition   Right hip pain   Discharged Condition: stable  Significant Diagnostic Studies: DG HIP UNILAT WITH PELVIS 2-3 VIEWS RIGHT Result Date: 06/28/2023 CLINICAL DATA:  Right hip pain. EXAM: DG HIP (WITH OR WITHOUT PELVIS) 2-3V RIGHT COMPARISON:  None Available. FINDINGS: No acute fracture or dislocation. The bones are osteopenic. Cystic changes of the right femoral head and neck measure up to 3.7 cm. Orthopedic referral is advised. Mild arthritic changes of the right hip. Total left hip arthroplasty. The soft tissues are unremarkable. Vascular calcifications. IMPRESSION: 1. No acute fracture or dislocation. 2. Cystic changes of the right femoral head and neck. Orthopedic referral is advised. Electronically Signed   By: Angus Bark M.D.   On: 06/28/2023 12:44    Labs:  Basic Metabolic Panel: Recent Labs  Lab 07/08/23 1343 07/10/23 1351 07/12/23 1100  NA 131* 131* 128*  K 5.3* 5.1 4.7  CL 91* 93* 89*  CO2 21* 22 22  GLUCOSE 110* 119* 124*  BUN 75* 67* 60*  CREATININE 9.65* 9.42* 8.57*  CALCIUM  8.2* 8.3* 8.2*  PHOS 4.9* 4.9* 5.0*    CBC: Recent Labs  Lab 07/08/23 1343 07/10/23 1351 07/12/23 1100  WBC 5.5 5.7 5.9  HGB 9.4* 9.3* 10.0*  HCT 29.8* 29.7* 31.0*  MCV 89.0 89.2 88.1  PLT 270 237 255    CBG: No results for input(s): "GLUCAP" in the last 168 hours.   Brief HPI:   Carl Grizzell Sr. is a 63 y.o. male with history of  ESRD- HD MWF, duodenitis w/ recurrent GIB, seizure, HTN, OSA, RCC s/p bilateral nephrectomies, CVA, chronic HA who started developing progressive numbness with pain bilateral upper extremity, right foot drop x 2 weeks and balance issue who fell due to knees giving away and struck his    Hospital Course: Carl Borunda Sr. was admitted to rehab 05/30/2023 for inpatient therapies to consist of PT, ST and OT at least three hours five days a week. Past admission physiatrist, therapy team and rehab RN have worked together to provide customized collaborative inpatient rehab.   Blood pressures were monitored on TID basis and noted to drop with HD. Midodrine  added to 30 minutes prior to HD session  Diabetes has been monitored with ac/hs CBG checks and SSI was use prn for tighter BS control.    CBC has been monitored with routine labs and anemia of chronic disease has been treated     Rehab course: During patient's stay in rehab weekly team conferences were held to monitor patient's progress, set goals and discuss barriers to discharge. At admission, patient required  He/She  has had improvement in activity tolerance, balance, postural control as well as ability to compensate for deficits. He has had improvement in functional use RUE/LUE  and RLE/LLE as well as improvement in awareness. He is able to complete ADL tasks with supervision. He requires CGA to supervision for transfers and to ambulate 150' with use of RW.      Disposition:  There are  no questions and answers to display.         Diet:  Special Instructions:   Allergies as of 07/14/2023       Reactions   Infed [iron Dextran] Other (See Comments)   Decreased BP    Vicodin [hydrocodone -acetaminophen ] Other (See Comments)   Decreased BP   Diclofenac Other (See Comments)   Caused hypotension        Medication List     STOP taking these medications    acetaminophen  650 MG CR tablet Commonly known as: TYLENOL  Replaced  by: acetaminophen  325 MG tablet   cyanocobalamin  1000 MCG tablet Commonly known as: VITAMIN B12   DSS 100 MG Caps   folic acid  1 MG tablet Commonly known as: FOLVITE    multivitamin Tabs tablet   sucralfate  1 GM/10ML suspension Commonly known as: CARAFATE    traMADol  50 MG tablet Commonly known as: ULTRAM    vitamin E 180 MG (400 UNITS) capsule       TAKE these medications    acetaminophen  325 MG tablet Commonly known as: TYLENOL  Take 1-2 tablets (325-650 mg total) by mouth every 4 (four) hours as needed for mild pain (pain score 1-3). Replaces: acetaminophen  650 MG CR tablet   atorvastatin  10 MG tablet Commonly known as: LIPITOR Take 1 tablet (10 mg total) by mouth every Thursday.   augmented betamethasone  dipropionate 0.05 % ointment Commonly known as: DIPROLENE -AF Apply 1 Application topically 2 (two) times daily as needed for rash.   calcitRIOL  0.5 MCG capsule Commonly known as: ROCALTROL  Take 3 capsules (1.5 mcg total) by mouth every Monday, Wednesday, and Friday with hemodialysis. Start taking on: July 15, 2023   cinacalcet  60 MG tablet Commonly known as: SENSIPAR  Take 120 mg by mouth daily with supper.   cromolyn  4 % ophthalmic solution Commonly known as: OPTICROM  Place 1 drop into both eyes daily as needed (redness).   cyclobenzaprine  10 MG tablet Commonly known as: FLEXERIL  Take 1 tablet (10 mg total) by mouth 3 (three) times daily as needed for muscle spasms.   diphenhydrAMINE  25 mg capsule Commonly known as: BENADRYL  Take 1 capsule (25 mg total) by mouth every 6 (six) hours as needed for itching.   gabapentin  100 MG capsule Commonly known as: NEURONTIN  Take 3 capsules (300 mg total) by mouth at bedtime.   Gerhardt's butt cream Crea Apply 1 Application topically 2 (two) times daily. Start taking on: July 15, 2023   Lokelma  10 g Pack packet Generic drug: sodium zirconium cyclosilicate  Take 1 packet by mouth daily.   melatonin 5 MG Tabs Take  1 tablet (5 mg total) by mouth at bedtime as needed.   midodrine  10 MG tablet Commonly known as: PROAMATINE  Take 1 tablet (10 mg total) by mouth every dialysis (30 min before each dialysis.).   oxyCODONE -acetaminophen  5-325 MG tablet Commonly known as: PERCOCET/ROXICET Take 1-2 tablets by mouth every 4 (four) hours as needed for severe pain (pain score 7-10) (5mg  for moderate pain and 10 mg for severe pain). What changed: reasons to take this   pantoprazole  40 MG tablet Commonly known as: PROTONIX  Take 1 tablet (40 mg total) by mouth 2 (two) times daily before a meal. What changed:  when to take this reasons to take this   polyethylene glycol powder 17 GM/SCOOP powder Commonly known as: GLYCOLAX /MIRALAX  Take 17 g by mouth 2 (two) times daily as needed for mild constipation. What changed:  when to take this reasons to take this   psyllium 95 % Pack  Commonly known as: HYDROCIL/METAMUCIL Take 1 packet by mouth 3 (three) times daily. Start taking on: July 15, 2023   senna-docusate 8.6-50 MG tablet Commonly known as: Senokot-S Take 2 tablets by mouth 2 (two) times daily.   sevelamer  carbonate 800 MG tablet Commonly known as: RENVELA  Take 3,200-4,000 mg by mouth 3 (three) times daily with meals.   topiramate  25 MG tablet Commonly known as: TOPAMAX  Take 1 tablet (25 mg total) by mouth 2 (two) times daily. Start taking on: July 15, 2023        Follow-up Information     [facility]. Go on 07/17/2023.   Why: Schedule is Monday, Wednesday, Friday.  Arrive at 9:20 am for 9:45 am chair time. Contact information: [address] [place] [address] [phone]                 Signed: Zelda Hickman 07/14/2023, 11:34 PM

## 2023-07-15 ENCOUNTER — Other Ambulatory Visit (HOSPITAL_COMMUNITY): Payer: Self-pay

## 2023-07-15 DIAGNOSIS — R519 Headache, unspecified: Secondary | ICD-10-CM | POA: Diagnosis not present

## 2023-07-15 DIAGNOSIS — R531 Weakness: Secondary | ICD-10-CM | POA: Diagnosis not present

## 2023-07-15 DIAGNOSIS — Z743 Need for continuous supervision: Secondary | ICD-10-CM | POA: Diagnosis not present

## 2023-07-15 DIAGNOSIS — I12 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 stage renal disease: Secondary | ICD-10-CM | POA: Diagnosis not present

## 2023-07-15 DIAGNOSIS — M25551 Pain in right hip: Secondary | ICD-10-CM | POA: Diagnosis not present

## 2023-07-15 DIAGNOSIS — R569 Unspecified convulsions: Secondary | ICD-10-CM | POA: Diagnosis not present

## 2023-07-15 DIAGNOSIS — Z96642 Presence of left artificial hip joint: Secondary | ICD-10-CM | POA: Diagnosis not present

## 2023-07-15 DIAGNOSIS — Z8719 Personal history of other diseases of the digestive system: Secondary | ICD-10-CM | POA: Diagnosis not present

## 2023-07-15 DIAGNOSIS — S12300A Unspecified displaced fracture of fourth cervical vertebra, initial encounter for closed fracture: Secondary | ICD-10-CM | POA: Diagnosis not present

## 2023-07-15 DIAGNOSIS — S14109D Unspecified injury at unspecified level of cervical spinal cord, subsequent encounter: Secondary | ICD-10-CM | POA: Diagnosis not present

## 2023-07-15 DIAGNOSIS — Z9181 History of falling: Secondary | ICD-10-CM | POA: Diagnosis not present

## 2023-07-15 DIAGNOSIS — D689 Coagulation defect, unspecified: Secondary | ICD-10-CM | POA: Diagnosis not present

## 2023-07-15 DIAGNOSIS — Z992 Dependence on renal dialysis: Secondary | ICD-10-CM | POA: Diagnosis not present

## 2023-07-15 DIAGNOSIS — E785 Hyperlipidemia, unspecified: Secondary | ICD-10-CM | POA: Diagnosis not present

## 2023-07-15 DIAGNOSIS — N250 Renal osteodystrophy: Secondary | ICD-10-CM | POA: Diagnosis not present

## 2023-07-15 DIAGNOSIS — R2681 Unsteadiness on feet: Secondary | ICD-10-CM | POA: Diagnosis not present

## 2023-07-15 DIAGNOSIS — S14109S Unspecified injury at unspecified level of cervical spinal cord, sequela: Secondary | ICD-10-CM | POA: Diagnosis not present

## 2023-07-15 DIAGNOSIS — E875 Hyperkalemia: Secondary | ICD-10-CM | POA: Diagnosis not present

## 2023-07-15 DIAGNOSIS — I959 Hypotension, unspecified: Secondary | ICD-10-CM | POA: Diagnosis not present

## 2023-07-15 DIAGNOSIS — M479 Spondylosis, unspecified: Secondary | ICD-10-CM | POA: Diagnosis not present

## 2023-07-15 DIAGNOSIS — M25561 Pain in right knee: Secondary | ICD-10-CM | POA: Diagnosis not present

## 2023-07-15 DIAGNOSIS — M6281 Muscle weakness (generalized): Secondary | ICD-10-CM | POA: Diagnosis not present

## 2023-07-15 DIAGNOSIS — S129XXD Fracture of neck, unspecified, subsequent encounter: Secondary | ICD-10-CM | POA: Diagnosis not present

## 2023-07-15 DIAGNOSIS — S14109A Unspecified injury at unspecified level of cervical spinal cord, initial encounter: Secondary | ICD-10-CM | POA: Diagnosis not present

## 2023-07-15 DIAGNOSIS — E1169 Type 2 diabetes mellitus with other specified complication: Secondary | ICD-10-CM | POA: Diagnosis not present

## 2023-07-15 DIAGNOSIS — N186 End stage renal disease: Secondary | ICD-10-CM | POA: Diagnosis not present

## 2023-07-15 DIAGNOSIS — M1611 Unilateral primary osteoarthritis, right hip: Secondary | ICD-10-CM | POA: Diagnosis not present

## 2023-07-15 DIAGNOSIS — N2581 Secondary hyperparathyroidism of renal origin: Secondary | ICD-10-CM | POA: Diagnosis not present

## 2023-07-15 DIAGNOSIS — K219 Gastro-esophageal reflux disease without esophagitis: Secondary | ICD-10-CM | POA: Diagnosis not present

## 2023-07-15 DIAGNOSIS — S12390D Other displaced fracture of fourth cervical vertebra, subsequent encounter for fracture with routine healing: Secondary | ICD-10-CM | POA: Diagnosis not present

## 2023-07-15 DIAGNOSIS — Z7401 Bed confinement status: Secondary | ICD-10-CM | POA: Diagnosis not present

## 2023-07-15 DIAGNOSIS — S12300D Unspecified displaced fracture of fourth cervical vertebra, subsequent encounter for fracture with routine healing: Secondary | ICD-10-CM | POA: Diagnosis not present

## 2023-07-15 DIAGNOSIS — E1122 Type 2 diabetes mellitus with diabetic chronic kidney disease: Secondary | ICD-10-CM | POA: Diagnosis not present

## 2023-07-15 DIAGNOSIS — D631 Anemia in chronic kidney disease: Secondary | ICD-10-CM | POA: Diagnosis not present

## 2023-07-15 LAB — RENAL FUNCTION PANEL
Albumin: 2.7 g/dL — ABNORMAL LOW (ref 3.5–5.0)
Anion gap: 14 (ref 5–15)
BUN: 75 mg/dL — ABNORMAL HIGH (ref 8–23)
CO2: 23 mmol/L (ref 22–32)
Calcium: 7.8 mg/dL — ABNORMAL LOW (ref 8.9–10.3)
Chloride: 89 mmol/L — ABNORMAL LOW (ref 98–111)
Creatinine, Ser: 9.39 mg/dL — ABNORMAL HIGH (ref 0.61–1.24)
GFR, Estimated: 6 mL/min — ABNORMAL LOW (ref >60)
Glucose, Bld: 100 mg/dL — ABNORMAL HIGH (ref 70–99)
Phosphorus: 4.5 mg/dL (ref 2.5–4.6)
Potassium: 5.3 mmol/L — ABNORMAL HIGH (ref 3.5–5.1)
Sodium: 126 mmol/L — ABNORMAL LOW (ref 135–145)

## 2023-07-15 LAB — CBC
HCT: 29.5 % — ABNORMAL LOW (ref 39.0–52.0)
Hemoglobin: 9.4 g/dL — ABNORMAL LOW (ref 13.0–17.0)
MCH: 28.1 pg (ref 26.0–34.0)
MCHC: 31.9 g/dL (ref 30.0–36.0)
MCV: 88.3 fL (ref 80.0–100.0)
Platelets: 243 10*3/uL (ref 150–400)
RBC: 3.34 MIL/uL — ABNORMAL LOW (ref 4.22–5.81)
RDW: 16.6 % — ABNORMAL HIGH (ref 11.5–15.5)
WBC: 5.2 10*3/uL (ref 4.0–10.5)
nRBC: 0 % (ref 0.0–0.2)

## 2023-07-15 MED ORDER — CINACALCET HCL 60 MG PO TABS: 120.0000 mg | ORAL_TABLET | Freq: Every day | ORAL | 1 refills | Status: DC

## 2023-07-15 MED ORDER — HEPARIN SODIUM (PORCINE) 1000 UNIT/ML DIALYSIS
3000.0000 [IU] | INTRAMUSCULAR | Status: DC | PRN
  Administered 2023-07-15: 3000 [IU] via INTRAVENOUS_CENTRAL
  Filled 2023-07-15: qty 3

## 2023-07-15 MED ORDER — PENTAFLUOROPROP-TETRAFLUOROETH EX AERO
1.0000 | INHALATION_SPRAY | CUTANEOUS | Status: DC | PRN
Start: 2023-07-15 — End: 2023-07-15

## 2023-07-15 MED ORDER — OXYCODONE-ACETAMINOPHEN 5-325 MG PO TABS: 1.0000 | ORAL_TABLET | Freq: Four times a day (QID) | ORAL | 0 refills | Status: DC | PRN

## 2023-07-15 MED ORDER — LIDOCAINE-PRILOCAINE 2.5-2.5 % EX CREA: 1.0000 | TOPICAL_CREAM | CUTANEOUS | Status: DC | PRN

## 2023-07-15 MED ORDER — CINACALCET HCL 60 MG PO TABS: 120.0000 mg | ORAL_TABLET | Freq: Every day | ORAL | 0 refills | Status: DC

## 2023-07-15 MED ORDER — ANTICOAGULANT SODIUM CITRATE 4% (200MG/5ML) IV SOLN: 5.0000 mL | Status: DC | PRN

## 2023-07-15 MED ORDER — ALTEPLASE 2 MG IJ SOLR: 2.0000 mg | Freq: Once | INTRAMUSCULAR | Status: DC | PRN

## 2023-07-15 MED ORDER — PENTAFLUOROPROP-TETRAFLUOROETH EX AERO: 1.0000 | INHALATION_SPRAY | CUTANEOUS | Status: DC | PRN

## 2023-07-15 MED ORDER — LIDOCAINE HCL (PF) 1 % IJ SOLN: 5.0000 mL | INTRAMUSCULAR | Status: DC | PRN

## 2023-07-15 MED ORDER — MIDODRINE HCL 5 MG PO TABS
ORAL_TABLET | ORAL | Status: AC
  Filled 2023-07-15: qty 2

## 2023-07-15 MED ORDER — HEPARIN SODIUM (PORCINE) 1000 UNIT/ML IJ SOLN
INTRAMUSCULAR | Status: AC
  Filled 2023-07-15: qty 3

## 2023-07-15 MED ORDER — CINACALCET HCL 60 MG PO TABS
120.0000 mg | ORAL_TABLET | Freq: Every day | ORAL | 0 refills | Status: DC
  Filled 2023-07-15: qty 60, 30d supply, fill #0

## 2023-07-15 MED ORDER — HEPARIN SODIUM (PORCINE) 1000 UNIT/ML DIALYSIS
1000.0000 [IU] | INTRAMUSCULAR | Status: DC | PRN
Start: 2023-07-15 — End: 2023-07-15

## 2023-07-15 NOTE — Discharge Instructions (Signed)
 Inpatient Rehab Discharge Instructions  Carl Wandell Sr. Discharge date and time: 07/15/23   Activities/Precautions/ Functional Status: Activity: activity as tolerated Diet: renal diet Limit fluids to 1200 cc/day Wound Care: keep wound clean and dry   Functional status:  ___ No restrictions     ___ Walk up steps independently _X__ 24/7 supervision/assistance   ___ Walk up steps with assistance ___ Intermittent supervision/assistance  ___ Bathe/dress independently ___ Walk with walker     ___ Bathe/dress with assistance ___ Walk Independently    ___ Shower independently ___ Walk with assistance    _X__ Shower with assistance _X__ No alcohol     ___ Return to work/school ________  Special Instructions:    My questions have been answered and I understand these instructions. I will adhere to these goals and the provided educational materials after my discharge from the hospital.  Patient/Caregiver Signature _______________________________ Date __________  Clinician Signature _______________________________________ Date __________  Please bring this form and your medication list with you to all your follow-up doctor's appointments.

## 2023-07-15 NOTE — Progress Notes (Addendum)
 Inpatient Rehabilitation Care Coordinator Discharge Note   Patient Details  Name: Randee Upchurch Sr. MRN: [id number] Date of Birth: 07-30-1960   Discharge location: TRANSFERRING TO [facility] FOR MORE REHAB  Length of Stay: 46 DAYS  Discharge activity level: MIN-CGA LEVEL  Home/community participation: ACTIVE  Patient response WU:JWJXBJ Literacy - How often do you need to have someone help you when you read instructions, pamphlets, or other written material from your doctor or pharmacy?: Never  Patient response YN:WGNFAO Isolation - How often do you feel lonely or isolated from those around you?: Rarely  Services provided included: MD, RD, PT, OT, RN, CM, TR, Pharmacy, Neuropsych, SW  Financial Services:  Field seismologist Utilized: Private Insurance [facility] MEDICARE  Choices offered to/list presented to: PT AND SON  Follow-up services arranged:  Other (Comment) (SNF)     LIFE [facility] SET UP FOR 3:30 PM  Pam-PA will call in sensipar  and son to pick up and take to facility. [facility]-Admissions aware and asked for this to be done due to cost of medication    Patient response to transportation need: Is the patient able to respond to transportation needs?: Yes In the past 12 months, has lack of transportation kept you from medical appointments or from getting medications?: No In the past 12 months, has lack of transportation kept you from meetings, work, or from getting things needed for daily living?: No   Patient/Family verbalized understanding of follow-up arrangements:  Yes  Individual responsible for coordination of the follow-up plan: SELF [phone]  Confirmed correct DME delivered: Mardell Shade 07/15/2023    Comments (or additional information):PT APPEALED THE INSURANCE DENIAL AND IT WAS OVER TURNED FOR HIM. OP-HD SET UP VIA [facility] CHAIR TIME 9:45 AM-FACILITY AWARE OF THIS AND WILL PROVIDE TRANSPORTATION TO OP-HD  Summary of Stay    Date/Time Discharge  Planning CSW  07/09/23 0915 Pt's MD peer to peer wads denied and pt plans to appeal himself, will not go to SNF today. Alos encouraged him to look at options for home or to another fmaily members home until apartment ready 7/1 RGD  07/02/23 0854 Looking for SNF bed until can get to first floor continues to make progress in therapies and goals upgraded. Barriers is dialysis and insurance RGD  06/26/23 1313 Family is not able to get 24/7 care and pt is currently living on the third floor and he is a HD pt. Will need to pursue SNF for the short term rehab RGD  06/18/23 1017 making progress in therapies family considering their options and are aware he will require 24/7 care. Touring area SNF', RGD  06/11/23 1112 Family Conference last week to discuss discharge plan and options for DC. Son looking for first floor apartment to move them to. Aware of 24/7 care needs. Have private duty list and SNF list to look into RGD  06/04/23 1021 HOme with son who is self employed unsre if can provide 24/7 care, has flight of steps to go down to go to OP-HD. Will discuss best plan for him at DC RGD       Frederich Montilla, Maralee Senate

## 2023-07-15 NOTE — Progress Notes (Signed)
 [facility] Progress Note   Subjective:   Seen on HD. Denies SOB, CP, dizziness, nausea. Discharging today.   Objective Vitals:   07/15/23 0934 07/15/23 1000 07/15/23 1030 07/15/23 1100  BP: 122/83 (!) 101/59 97/65 90/64   Pulse: 83 85 95 97  Resp: 14 17 10 11   Temp:      TempSrc:      SpO2: 100% 99% 100% 99%  Weight:      Height:       Physical Exam  General: Alert male in NAD Lungs: Respirations unlabored on RA, lungs CTA anteriorly Abdomen: Non-distended Extremities: No edema b/l lower extremities Dialysis Access: RUE AVF + t/b    Additional Objective Labs: Basic Metabolic Panel: Recent Labs  Lab 07/10/23 1351 07/12/23 1100 07/15/23 0915  NA 131* 128* 126*  K 5.1 4.7 5.3*  CL 93* 89* 89*  CO2 22 22 23   GLUCOSE 119* 124* 100*  BUN 67* 60* 75*  CREATININE 9.42* 8.57* 9.39*  CALCIUM  8.3* 8.2* 7.8*  PHOS 4.9* 5.0* 4.5   Liver Function Tests: Recent Labs  Lab 07/10/23 1351 07/12/23 1100 07/15/23 0915  ALBUMIN  2.8* 2.8* 2.7*   No results for input(s): "LIPASE", "AMYLASE" in the last 168 hours. CBC: Recent Labs  Lab 07/08/23 1343 07/10/23 1351 07/12/23 1100 07/15/23 0915  WBC 5.5 5.7 5.9 5.2  HGB 9.4* 9.3* 10.0* 9.4*  HCT 29.8* 29.7* 31.0* 29.5*  MCV 89.0 89.2 88.1 88.3  PLT 270 237 255 243   Blood Culture    Component Value Date/Time   SDES FLUID 06/18/2023 1357   SPECREQUEST NONE 06/18/2023 1357   CULT  06/18/2023 1357    NO GROWTH 3 DAYS Performed at [facility], [address] [place], [place] [address]    REPTSTATUS 06/22/2023 FINAL 06/18/2023 1357    Cardiac Enzymes: No results for input(s): "CKTOTAL", "CKMB", "CKMBINDEX", "TROPONINI" in the last 168 hours. CBG: No results for input(s): "GLUCAP" in the last 168 hours. Iron Studies: No results for input(s): "IRON", "TIBC", "TRANSFERRIN", "FERRITIN" in the last 72 hours. @lablastinr3 @ Studies/Results: No results found. Medications:  anticoagulant sodium  citrate      (feeding supplement) PROSource Plus  30 mL Oral BID BM   atorvastatin   10 mg Oral Q Thu   calcitRIOL   1.5 mcg Oral Q M,W,F-HD   carbamide peroxide  5 drop Right EAR BID   Chlorhexidine  Gluconate Cloth  6 each Topical Q0600   cinacalcet   120 mg Oral Q supper   darbepoetin (ARANESP ) injection - DIALYSIS  200 mcg Subcutaneous Q Tue-1800   gabapentin   300 mg Oral QHS   Gerhardt's butt cream   Topical BID   pantoprazole   40 mg Oral BID AC   psyllium  1 packet Oral TID   senna-docusate  2 tablet Oral BID   sevelamer  carbonate  3,200 mg Oral TID WC   sodium zirconium cyclosilicate   10 g Oral Daily   topiramate   25 mg Oral BID    Dialysis Orders: OP Dialysis Orders [facility] - MWF 4h  B400   85.5kg   2K bath  AVF   - Heparin  3000 units   - Mircera 60mcg IV q 2 weeks- last dose 05/24/23  - Calcitriol  1.5mcg PO q HD    Assessment/Plan: 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  Continue on schedule. HD 1st shift Monday.  Hyperkalemia:Chronic issue.  On Lokelma  daily now. Continue to  reinforce renal diet. HTN/volume: Tolerates high UF goals, 3.5-4L.  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 We have ordered to further increase to aranesp  200 mcg weekly on Tuesdays  Secondary HPTH: Calcium  and phos at goal. Continue sevelamer  and sensipar . Resumed calcitriol   Nutrition: continue protein supplements. Patient reports appetite is better Dispo - For discharge to [facility] Monday.  Ramona Burner, PA-C 07/15/2023, 11:21 AM  [facility] Pager: [phone]

## 2023-07-15 NOTE — Progress Notes (Signed)
 Inpatient Rehabilitation Discharge Medication Review by a Pharmacist  A complete drug regimen review was completed for this patient to identify any potential clinically significant medication issues.  High Risk Drug Classes Is patient taking? Indication by Medication  Antipsychotic No   Anticoagulant No   Antibiotic No   Opioid Yes Percocet: pain  Antiplatelet No   Hypoglycemics/insulin  No   Vasoactive Medication Yes Midodrine : Hypotension  Chemotherapy No   Other Yes Tylenol , gabapentin : pain Lipitor: HLD Calcitriol , Cinacalcet , sevelamer : metabolic bone disease/ESRD Protonix : GERD Miralax , Senokot: constipation Lokelma : hyperkalemia Melatonin: sleep Flexeril : muscle spasms Topamax : headache Benadryl : itching Cromolyn  opth: eye care Diprolene : rash     Type of Medication Issue Identified Description of Issue Recommendation(s)  Drug Interaction(s) (clinically significant)     Duplicate Therapy     Allergy     No Medication Administration End Date     Incorrect Dose     Additional Drug Therapy Needed     Significant med changes from prior encounter (inform family/care partners about these prior to discharge).  Restart or discontinue as appropriate. Communicate medication changes with patient/family at discharge  Other       Clinically significant medication issues were identified that warrant physician communication and completion of prescribed/recommended actions by midnight of the next day:  No   Time spent performing this drug regimen review (minutes): 30   Thank you for allowing pharmacy to be a part of this patient's care.   Victoria Grass, PharmD 06/19/2023 9:13 AM   **Pharmacist phone directory can be found on [email or web address] listed under [facility]**

## 2023-07-15 NOTE — Progress Notes (Signed)
 PROGRESS NOTE   Subjective/Complaints:  Right knee pain. Going to HD today  ROS: Patient denies fever, rash, sore throat, blurred vision, dizziness, nausea, vomiting, diarrhea, cough, shortness of breath or chest pain,   headache, or mood change.   Objective:   No results found.      Recent Labs    07/15/23 0915  WBC 5.2  HGB 9.4*  HCT 29.5*  PLT 243     Recent Labs    07/15/23 0915  NA 126*  K 5.3*  CL 89*  CO2 23  GLUCOSE 100*  BUN 75*  CREATININE 9.39*  CALCIUM  7.8*     Intake/Output Summary (Last 24 hours) at 07/15/2023 1106 Last data filed at 07/15/2023 0803 Gross per 24 hour  Intake 477 ml  Output --  Net 477 ml        Physical Exam: Vital Signs Blood pressure 90/64, pulse 97, temperature 98 F (36.7 C), resp. rate 11, height 6\' 1"  (1.854 m), weight 87.8 kg, SpO2 99%.   Constitutional: No distress . Vital signs reviewed. HEENT: NCAT, EOMI, oral membranes moist Neck: supple Cardiovascular: RRR without murmur. No JVD    Respiratory/Chest: CTA Bilaterally without wheezes or rales. Normal effort    GI/Abdomen: BS +, non-tender, non-distended Ext: no clubbing, cyanosis, or edema Psych: pleasant and cooperative  Neurological: Ox3 MsK: R knee with mild effusion again, no warmth/erythema, minimal tenderness, knee sleeve applied.    R lateral and groin TTP- c/w trochanteric bursitis and R joint pain  MSK: cannot make fist at all on L hand-  and can make fist on R hand, but only for 3 seconds.   Neurological: Ox3, follows commands, cranial nerves II through XII grossly intact,   MSK- R knee much less swollen MsK: B/L knees with effusions- less TTP on R knee this AM- looks good- mild effusion , no erythema, not really focally warm to touch- Both have effusions, - R hand swelling looks good/resolved  MSK- R hand swelling esp ulnar side of hand/dorsum and L wrist-stable this AM RUE- Biceps  4/5; WE 4-/5; Triceps 2/5; Grip 2-/5 and FA 1/5 LUE- 4/5 except triceps 3+ to 4-/5 RLE- HF 1 to 2-/5; KE 2-/5; DF/PF 2-/5 LLE- HF 3/5; KE 4-/5; DF/PF 4/5    Skin: C/D/I. Multiple lesions on BL legs with various stages of healing.  + RUE fistula - intact       Neurologic exam: Alert and oriented x 4, fluent, follows commands, cranial nerves II through XII grossly intact, no hypertonia noted  Sensation: Reduced to light touch in distal right lower extremity to the mid-thigh       Strength:                RUE: 2/5 SA, 2/5 EF, 2/5 EE, 2/5 WE, 2/5 FF, 2/5 FA                LUE:  3/5 SA, 3/5 EF, 3/5 EE, 3/5 WE, 3/5 FF, 3/5 FA                RLE: 2/5 HF, 2/5 KE, 2/5  DF, 2/5  EHL, 2/5  PF  LLE:  3/5 HF, 4/5 KE, 4/5  DF, 4/5  EHL, 4/5  PF     Assessment/Plan: 1. Functional deficits which require 3+ hours per day of interdisciplinary therapy in a comprehensive inpatient rehab setting. Physiatrist is providing close team supervision and 24 hour management of active medical problems listed below. Physiatrist and rehab team continue to assess barriers to discharge/monitor patient progress toward functional and medical goals  Care Tool:  Bathing    Body parts bathed by patient: Right arm, Left arm, Chest, Abdomen, Front perineal area, Buttocks, Face, Left lower leg, Right lower leg, Left upper leg, Right upper leg   Body parts bathed by helper: Left arm, Front perineal area, Buttocks, Right lower leg, Left lower leg     Bathing assist Assist Level: Supervision/Verbal cueing     Upper Body Dressing/Undressing Upper body dressing   What is the patient wearing?: Pull over shirt    Upper body assist Assist Level: Independent with assistive device    Lower Body Dressing/Undressing Lower body dressing      What is the patient wearing?: Pants, Underwear/pull up     Lower body assist Assist for lower body dressing: Supervision/Verbal cueing     Toileting Toileting  Toileting Activity did not occur (Clothing management and hygiene only): N/A (no void or bm) (anuric, HD patient)  Toileting assist Assist for toileting: Supervision/Verbal cueing     Transfers Chair/bed transfer  Transfers assist     Chair/bed transfer assist level: Supervision/Verbal cueing     Locomotion Ambulation   Ambulation assist   Ambulation activity did not occur: Safety/medical concerns  Assist level: Contact Guard/Touching assist Assistive device: Walker-rolling Max distance: 150   Walk 10 feet activity   Assist  Walk 10 feet activity did not occur: Safety/medical concerns  Assist level: Contact Guard/Touching assist Assistive device: Walker-rolling   Walk 50 feet activity   Assist Walk 50 feet with 2 turns activity did not occur: Safety/medical concerns  Assist level: Contact Guard/Touching assist Assistive device: Walker-rolling    Walk 150 feet activity   Assist Walk 150 feet activity did not occur: Safety/medical concerns  Assist level: Contact Guard/Touching assist Assistive device: Walker-rolling    Walk 10 feet on uneven surface  activity   Assist Walk 10 feet on uneven surfaces activity did not occur: Safety/medical concerns   Assist level: Contact Guard/Touching assist Assistive device: Walker-rolling   Wheelchair     Assist Is the patient using a wheelchair?: Yes Type of Wheelchair: Power    Wheelchair assist level: Independent Max wheelchair distance: >500    Wheelchair 50 feet with 2 turns activity    Assist        Assist Level: Independent   Wheelchair 150 feet activity     Assist      Assist Level: Independent   Blood pressure 90/64, pulse 97, temperature 98 F (36.7 C), resp. rate 11, height 6\' 1"  (1.854 m), weight 87.8 kg, SpO2 99%.  Medical Problem List and Plan: 1. 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  Antithrombotics: -DVT/anticoagulation:  Mechanical: Sequential compression devices, below knee Bilateral lower extremities             -antiplatelet therapy: N/A--no antiplatelet due to hx of recurrent GIB  4/25- d/w family- they agree that Lovenox  not appropriate at this time.   -06/09/23 ordered dopplers to r/o clots given low grade temps recently  4/28- LE Dopplers (-) for DVT 3. Pain Management:  oxycodone  prn. Gabapentin  300 mg/hs.              --has Flexeril  10 mg TID prn              - patient with intermittent, severe b/l headache - add topomax 25 mg BID    4/19- pt rating pain 07/19/08 which is "tolerable" 5/7- got Ortho to come and take fluid off R knee- got 100cc off- pain is no more than 1/10 this AM! 5/8-5/9 knee still doing well 5/13-5/14-  pain controlled- allowed him to walk further yesterday in therapy- still cannot do more than 1 step 5/16- will get R hip xray due to clunk and R hi pain -06/29/23 R hip xray with cystic changes of femoral head/neck, also degeneration on my review- will need ortho f/up but doubt need for emergent consultation. Dr. 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  -6/2--may need another injection as an outpt (see above) 4. Mood/Behavior/Sleep: LCSW to follow for evaluation and support              -antipsychotic agents: N/A             --melatonin prn for insomnia.  -06/09/23 poor sleep, d/t interruptions, asked nursing to minimize interruptions between 11p-5a-- sleeping better lately 5. Neuropsych/cognition: This patient is capable of making decisions on his own behalf. 5/8- Ox3 and appropriate 6. Skin/Wound Care: Routine pressure relief measures.   4/19- due to ulcers on legs, on low air loss mattress 7. Fluids/Electrolytes/Nutrition: Strict I/O. Daily weights. Labs with HD. 8. ESRD- HD MWF: Schedule HD at the end of the day to help with tolerance of therapy             --Sensipar  and Renvela  for metabolic bone disease.   - Patient reports no longer makes urine  5/13 - last HD yesterday 5/15- last HD yesterday=- they were not able to finish due to equipment issues- will need to monitor fluid intake today- so can make it til tomorrow- they are not doing more today.  5/28- HD today 9. Anemia of chronic disease: Monitor H/H, aranesp  5/9- Hb 7.6 in last 24 hours- done yesterday- feels better- no dizziness or fatigue- so doesn't feel transfusion is appropriate 5/12 HGB up to 8.1 , improved continue to monitor.  He is on Aranesp  per nephrology 5/15- pt needs to have CBC rechecked in HD tomorrow-  -06/29/23 Hgb 7.8, stable. Monitor.  5/20- Hb 8.0 5/21- refused labs this AM since getting in HD- overall labs looking better 2 days ago 5/22- Hb 9.5 this AM!! 5/28- Labs today? HD today 5/29- Hb 9.3- stable>>  10.0 5/30>> 6/2 9.4 10. H/o recurrent GIB/[employer]- will only take blood products in absolute emergency per Renal note: Continue Protonix  BID 11. H/o Hypotension: Has had drop  in BP with HD. Now cervical SCI.  --Monitor for symptoms day after HD and with activity. -4/18 baseline blood pressure stable, monitor with activity 4/19- BP actually slightly elevated today- 140s-150's- con't regimen, since can drop with HD and therapy 4/20- BP actually doing better today- in  100s'110s- however HR elevated, likely to fluid overload 4/21 nephrology started midodrine  10mg  before dialysis, BP appears to be doing a little better today 5/8- BP on low side in last 24 hours- likely made worse due to HD yesterday- denies OH Sx's. Con't to monitor trend 5/9- BP is running in 90's systolic- doesn't feel dizzy or lightheaded sitting, but could drop when standing-will d/w therapy 5/22- 5/25- 5/26- Drop in BP yesterday with standing to 90's systolic- felt dizzy- will on Midodrine - will wait to increase 5/27- 5/29- tolerating therapy without drops in BP 5/30-6/2  BP doing better- con't regimen Vitals:   07/13/23 1737 07/13/23 2005 07/14/23 0330 07/14/23 1339  BP: 101/68 113/69 115/89 107/77   07/14/23 1948 07/15/23 0654 07/15/23 0900 07/15/23 0918  BP: 103/83 118/77 119/76 122/83   07/15/23 0934 07/15/23 1000 07/15/23 1030 07/15/23 1100  BP: 122/83 (!) 101/59 97/65 90/64      12. Constipation: Small BM on 04/14 --had of nulytey yesterday and another today? -Xray 4/17 without excessive stool, patient reports stool still little hard yesterday.  Will increase MiraLAX  to 3 times a day, continue Senokot twice a day. Suppository PRN   06/09/23 LBM yesterday, cont regimen 4/28- LBM overnight- has been refusing miralax - made it prn- taking metamucil -07/14/23 LBM   13. ?left knee effusion. Not currently bothersome. Consider imaging.  14.  SIRS- Fever/hypotension/tachycardia - 4/21 Currently afebrile, he got to 50 cc IVF yesterday, CXR negative, EKG sinus tachycardia. Patient was started on Maxipime  and vancomycin  yesterday.  Appears to be doing much better today.  WBC not elevated at 7.1.  Continue to follow blood cultures 4/22- Blood Cx's (-) so far- WBC 7.3- will con't IV ABX until know final results of blood Cx's- CXR (-)- but temp was 103.2 yesterday overnight.  4/23- Blood C'x (-) x2 days so far- T 99.9 this Am after a few sips of coffee- was 100.4 this AM- will con't IV ABX  since concerned about possible bacteremia- esp since having low grade temps still 4/24- No more fevers, even low grade- no growth on blood Cx' bu it says that didn't get enough blood, so much be falsely negative 4/25- No growth for 5 days, but it also said not enough blood to be sure was a true negative. Will change stop date til tomorrow -06/08/23 at 3am temp recorded as 100.3 but 5am was 98.3; will get CBC w/diff, monitor temps closely today, if continued low grade temps, will reach out to ID. BCx NGTD x5d. Didn't get vanc yesterday? Or cefepime ? Unclear why. Cefepime  started this morning at 0011. -06/09/23 temp 100.3 at 3am yesterday and then 100 later in the day, but no fevers since, no tachycardia.  -WBC yesterday 8.9.  -Now with some confusion; spoke with Dr. Levern Reader of ID, stated cefepime  could be the cause of the confusion -recommended we stop all abx (which ended yesterday anyway) -watch the patient OFF abx -if fevers >100.3 occur then she recommended repeat BCx and CXR and could call her back to see if any  abx would be advised at that time.  -Head CT and dopplers ordered as well, as above 4/28- CT looks good- no fever, WBC is OK- off ABX 4/29- Also Dopplers (-) feeling good this AM 06/16/23 seems to have resolved. 15. Tachycardia- resolved 4/22- doing better- right at 100 this AM- will monitor- likely can be from dregs of infection vs OH. -will monitor  4/23- Tachycardia improving- 4/24-27 HR in 90's right now- doing better  4/29- 4/30- HR running in 90s-low 100's today 5/12 heart rate in the 70s and 80s primarily, continue to monitor 5/20- HR back into low 100's- but is feeling good- denies Sx's.  5/22- back to running 80s-high 90's- will monitor trend 5/29-31 HR in 80s-90's in last few days- doing better    07/15/2023   11:00 AM 07/15/2023   10:30 AM 07/15/2023   10:00 AM  Vitals with BMI  Systolic 90 97 101  Diastolic 64 65 59  Pulse 97 95 85    16. R hand swelling  4/23- doing  ice- will do xrays 4/24- Xray doesn't look like cause of swelling, however could be due to new shunt? 4/30- resolved 17. Hypocalcemia 4/24- his Albumin  is 2.1, so probably ok, and phos coming down- will leave to renal- thank you for their help  5/1- Ca 9.3 5/8- Ca 8.8 5/15- Ca 8.5 18. Hyperkalemia 4/28- K+ 6.3 today- got hypoosmotic bath to reduce K+ per note from RN/HD nurse? Also has Na of 127- being addressed by HD/renal- Phos also 5.8- but again, getting HD today 5/1- K+ down to 5.6 and Na 129 5/6- Na 133 and K+ down to 4.7 from 5.6 5/7- refused labs this AM- wants in HD 5/9- K_ came back as 4.5 5/12 potassium elevated today, chronic issue has dialysis today.  Treatment per renal team 5/14- K+ down to 5.7- from 6.2- has HD today 06/29/23 down to 5.3 yesterday, had dialysis yesterday.  5/19- HD today 5/20- K+ stayed at 5.3- doing better 5/24- K+ 4.5 yesterday- best it's been 5/27- K+ 5.3- per renal 5/29- K+ 5.1 with HD yesterday 19. R>L knee pain 4/29- will ask Ortho if they will tap his R knee- I have no problem doing injection of knee, however with so much fluid, I would rather Ortho tap him and then do steroid injection.   4/30- we added Prednisone  40 mg daily x 3 days- to see if helps knee pain  5/1- mild reduction in pain so far 5/6- Pt has short term improvement from Prednisone  dosing for knees- con't percocet prn 5/7- since pt was doing so much worse with therapy, asked Ortho to come and tap knee and take off fluid- do steroid injection- they drew off 100CC- 5 different draws required- fluid is pending, but has a lot of WBCs- no organisms on gram stain.   5/8- no growth for 24 hours- R knee pain so much better  -06/22/23 NGTD x3 days, monitor  5/14- No growth  - pain so much better after 100cc fluid removed and got injection -07/14/23 R knee effusion returned, spoke with Starr Eddy PA-C, too soon for injection again (last done 06/18/23), and aspiration alone wouldn't be  beneficial d/t likelihood of reaccumulation-- discussed knee sleeve use. Could consider steroid although not super helpful last time, so held off for now.  6/2 see above 20.  R hip pain 5/16- will get R hip xray- also c/w trochanteric bursitis on exam- if doesn't improve, will do trochanteric bursa injection Tuesday -06/29/23 R  hip xray with cystic changes of femoral head/neck, also degeneration on my review- will need ortho f/up but doubt need for emergent consultation. Dr. Christiane Cowing is his primary ortho.  5/20- Dr Christiane Cowing wrote that he has severe arthritis, but needs to be out of hospital to get hip replacement.  5/29- had severe pain in R hip last night at 1:30am- is better after pain meds    LOS: 46 days A FACE TO FACE EVALUATION WAS PERFORMED  Rawland Caddy 07/15/2023, 11:06 AM

## 2023-07-16 DIAGNOSIS — E875 Hyperkalemia: Secondary | ICD-10-CM | POA: Diagnosis not present

## 2023-07-16 DIAGNOSIS — I12 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 stage renal disease: Secondary | ICD-10-CM | POA: Diagnosis not present

## 2023-07-16 DIAGNOSIS — Z992 Dependence on renal dialysis: Secondary | ICD-10-CM | POA: Diagnosis not present

## 2023-07-16 DIAGNOSIS — N186 End stage renal disease: Secondary | ICD-10-CM | POA: Diagnosis not present

## 2023-07-16 DIAGNOSIS — M479 Spondylosis, unspecified: Secondary | ICD-10-CM | POA: Diagnosis not present

## 2023-07-16 DIAGNOSIS — R569 Unspecified convulsions: Secondary | ICD-10-CM | POA: Diagnosis not present

## 2023-07-16 DIAGNOSIS — E785 Hyperlipidemia, unspecified: Secondary | ICD-10-CM | POA: Diagnosis not present

## 2023-07-16 DIAGNOSIS — I959 Hypotension, unspecified: Secondary | ICD-10-CM | POA: Diagnosis not present

## 2023-07-16 DIAGNOSIS — K219 Gastro-esophageal reflux disease without esophagitis: Secondary | ICD-10-CM | POA: Diagnosis not present

## 2023-07-16 DIAGNOSIS — S12300D Unspecified displaced fracture of fourth cervical vertebra, subsequent encounter for fracture with routine healing: Secondary | ICD-10-CM | POA: Diagnosis not present

## 2023-07-16 NOTE — Discharge Planning (Signed)
 [facility] Patient Discharge Orders- [facility] CLINIC: [facility]  Patient's name: Carl Fye Sr. Admit/DC Dates: 05/30/2023 - 07/15/2023  Discharge Diagnoses: Spinal cord injury, cervical region   Debility s/p CIR  Aranesp : Given: yes   Date and amount of last dose: 200mcg on 07/15/23  Last Hgb: 9.4 PRBC's Given: no Date/# of units: N/A ESA dose for discharge: mircera 200 mcg IV q 2 weeks  IV Iron dose at discharge: none  Heparin  change: no  EDW Change: no New EDW:   Bath Change: no  Access intervention/Change: no Details:  Hectorol /Calcitriol  change: no  Discharge Labs: Calcium  7.8 Phosphorus 4.5 Albumin  2.7 K+ 5.3  IV Antibiotics: no Details:  On Coumadin?: no Last INR: Next INR: Managed By:   OTHER/APPTS/LAB ORDERS:    D/C Meds to be reconciled by nurse after every discharge.  Completed By: Ramona Burner, PA-C 07/16/2023, 10:25 AM  [facility] Pager: [phone]   Reviewed by: MD:______ RN_______

## 2023-07-17 DIAGNOSIS — R519 Headache, unspecified: Secondary | ICD-10-CM | POA: Diagnosis not present

## 2023-07-17 DIAGNOSIS — D689 Coagulation defect, unspecified: Secondary | ICD-10-CM | POA: Diagnosis not present

## 2023-07-17 DIAGNOSIS — Z992 Dependence on renal dialysis: Secondary | ICD-10-CM | POA: Diagnosis not present

## 2023-07-17 DIAGNOSIS — N2581 Secondary hyperparathyroidism of renal origin: Secondary | ICD-10-CM | POA: Diagnosis not present

## 2023-07-17 DIAGNOSIS — N186 End stage renal disease: Secondary | ICD-10-CM | POA: Diagnosis not present

## 2023-07-18 DIAGNOSIS — Z96642 Presence of left artificial hip joint: Secondary | ICD-10-CM | POA: Diagnosis not present

## 2023-07-18 DIAGNOSIS — S12300D Unspecified displaced fracture of fourth cervical vertebra, subsequent encounter for fracture with routine healing: Secondary | ICD-10-CM | POA: Diagnosis not present

## 2023-07-18 DIAGNOSIS — E1169 Type 2 diabetes mellitus with other specified complication: Secondary | ICD-10-CM | POA: Diagnosis not present

## 2023-07-18 DIAGNOSIS — Z992 Dependence on renal dialysis: Secondary | ICD-10-CM | POA: Diagnosis not present

## 2023-07-18 DIAGNOSIS — S129XXD Fracture of neck, unspecified, subsequent encounter: Secondary | ICD-10-CM | POA: Diagnosis not present

## 2023-07-18 DIAGNOSIS — Z9181 History of falling: Secondary | ICD-10-CM | POA: Diagnosis not present

## 2023-07-18 DIAGNOSIS — Z8719 Personal history of other diseases of the digestive system: Secondary | ICD-10-CM | POA: Diagnosis not present

## 2023-07-18 DIAGNOSIS — I12 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 stage renal disease: Secondary | ICD-10-CM | POA: Diagnosis not present

## 2023-07-18 DIAGNOSIS — N186 End stage renal disease: Secondary | ICD-10-CM | POA: Diagnosis not present

## 2023-07-18 DIAGNOSIS — E1122 Type 2 diabetes mellitus with diabetic chronic kidney disease: Secondary | ICD-10-CM | POA: Diagnosis not present

## 2023-07-18 DIAGNOSIS — D631 Anemia in chronic kidney disease: Secondary | ICD-10-CM | POA: Diagnosis not present

## 2023-07-18 DIAGNOSIS — S14109D Unspecified injury at unspecified level of cervical spinal cord, subsequent encounter: Secondary | ICD-10-CM | POA: Diagnosis not present

## 2023-07-19 DIAGNOSIS — N2581 Secondary hyperparathyroidism of renal origin: Secondary | ICD-10-CM | POA: Diagnosis not present

## 2023-07-19 DIAGNOSIS — N186 End stage renal disease: Secondary | ICD-10-CM | POA: Diagnosis not present

## 2023-07-19 DIAGNOSIS — D689 Coagulation defect, unspecified: Secondary | ICD-10-CM | POA: Diagnosis not present

## 2023-07-19 DIAGNOSIS — Z992 Dependence on renal dialysis: Secondary | ICD-10-CM | POA: Diagnosis not present

## 2023-07-19 DIAGNOSIS — R519 Headache, unspecified: Secondary | ICD-10-CM | POA: Diagnosis not present

## 2023-07-22 DIAGNOSIS — Z992 Dependence on renal dialysis: Secondary | ICD-10-CM | POA: Diagnosis not present

## 2023-07-22 DIAGNOSIS — R519 Headache, unspecified: Secondary | ICD-10-CM | POA: Diagnosis not present

## 2023-07-22 DIAGNOSIS — N2581 Secondary hyperparathyroidism of renal origin: Secondary | ICD-10-CM | POA: Diagnosis not present

## 2023-07-22 DIAGNOSIS — D689 Coagulation defect, unspecified: Secondary | ICD-10-CM | POA: Diagnosis not present

## 2023-07-22 DIAGNOSIS — N186 End stage renal disease: Secondary | ICD-10-CM | POA: Diagnosis not present

## 2023-07-23 ENCOUNTER — Ambulatory Visit (INDEPENDENT_AMBULATORY_CARE_PROVIDER_SITE_OTHER): Admitting: Orthopaedic Surgery

## 2023-07-23 ENCOUNTER — Encounter: Payer: Self-pay | Admitting: Orthopaedic Surgery

## 2023-07-23 DIAGNOSIS — M1611 Unilateral primary osteoarthritis, right hip: Secondary | ICD-10-CM | POA: Diagnosis not present

## 2023-07-23 NOTE — Progress Notes (Signed)
 Office Visit Note   Patient: Carl Ditmer Sr.           Date of Birth: 10/24/1960           MRN: [id number] Visit Date: 07/23/2023              Requested by: Lanae Pinal, MD [address] [place],  [place] [address] PCP: Lanae Pinal, MD   Assessment & Plan: Visit Diagnoses:  1. Primary osteoarthritis of right hip     Plan: History of Present Illness Carl Pray Sr. "[name]" is a 63 year old male with a history of spinal cord injury who presents with right hip pain.  He experiences right hip pain, primarily during walking, with no pain on flexion or rotation. He has a history of left hip surgery and may require future surgery on the right hip.  In April 2025, he sustained a C4 vertebra fracture with spinal cord injury, leading to emergency surgery Post-surgery, he uses a wheelchair but can walk slowly.  He is undergoing rehabilitation in [place], focusing on regaining strength in his arms and legs. He takes pain medication as needed to manage discomfort.  Results RADIOLOGY Right hip x-ray: Advanced arthritis and large subchondral cyst femoral head (07/2023)  Assessment and Plan Cervical spinal cord injury with C4 fracture C4 fracture with spinal cord injury treated with surgery. Persistent arm and leg weakness affecting mobility and recovery. - Continue rehabilitation to improve strength and mobility.  Osteoarthritis of right hip Osteoarthritis with large cyst. Not a candidate for hip replacement due to weakness. - Consider cortisone injection for pain not manageable  Follow-Up Instructions: No follow-ups on file.   Orders:  No orders of the defined types were placed in this encounter.  No orders of the defined types were placed in this encounter.     Procedures: No procedures performed   Clinical Data: No additional findings.   Subjective: Chief Complaint  Patient presents with   Right Hip - Pain    HPI  Review of Systems   Constitutional: Negative.   HENT: Negative.    Eyes: Negative.   Respiratory: Negative.    Cardiovascular: Negative.   Gastrointestinal: Negative.   Endocrine: Negative.   Genitourinary: Negative.   Musculoskeletal:  Positive for gait problem.  Skin: Negative.   Allergic/Immunologic: Negative.   Hematological: Negative.   Psychiatric/Behavioral: Negative.    All other systems reviewed and are negative.    Objective: Vital Signs: There were no vitals taken for this visit.  Physical Exam Vitals and nursing note reviewed.  Constitutional:      Appearance: He is well-developed.  HENT:     Head: Normocephalic and atraumatic.  Eyes:     Pupils: Pupils are equal, round, and reactive to light.  Pulmonary:     Effort: Pulmonary effort is normal.  Abdominal:     Palpations: Abdomen is soft.  Musculoskeletal:        General: Normal range of motion.     Cervical back: Neck supple.  Skin:    General: Skin is warm.  Neurological:     Mental Status: He is alert and oriented to person, place, and time.  Psychiatric:        Behavior: Behavior normal.        Thought Content: Thought content normal.        Judgment: Judgment normal.     Ortho Exam  Specialty Comments:  No specialty comments available.  Imaging: No results found.  PMFS History: Patient Active Problem List   Diagnosis Date Noted   Right hip pain 07/14/2023   Coping style affecting medical condition 06/05/2023   Spinal cord injury, cervical region, sequela (HCC) 05/30/2023   Cervical vertebral fracture (HCC) 05/25/2023   History of GI bleed 05/25/2023   C4 cervical fracture (HCC) 05/25/2023   Duodenitis    Gastritis and gastroduodenitis    Ischemic colitis (HCC)    Bright red blood per rectum    ABLA (acute blood loss anemia)    Gastric ulcer    Duodenal ulcer    Diverticulosis of colon without hemorrhage    GI bleed 09/17/2021   Acute upper GI bleeding 09/11/2021   Mucosal abnormality of  esophagus    Mucosal abnormality of stomach    Mucosal abnormality of duodenum    Upper GI bleed    Left hip pain 11/16/2020   Type 2 diabetes mellitus with other specified complication (HCC) 11/14/2020   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/14/2020   Primary osteoarthritis of left hip 10/07/2020   Status post reverse total arthroplasty of right shoulder 08/24/2020   Diverticulitis 03/08/2020   ESRD (end stage renal disease) (HCC) 03/08/2020   Polyneuropathy in diseases classified elsewhere (HCC) 03/08/2020   Pure hypercholesterolemia 03/08/2020   Sacral back pain 03/08/2020   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 11/10/2019   Acute gastric ulcer with hemorrhage    Acute GI bleeding 07/31/2019   Hypotension of hemodialysis 07/31/2019   Diverticulitis large intestine 07/31/2019   Mass of soft tissue of shoulder 05/26/2019   Awaiting organ transplant status 03/18/2019   Finding of cocaine in blood 02/25/2019   Anaphylactic shock, unspecified, sequela 11/21/2018   Hypercalcemia 10/28/2018   Diarrhea, unspecified 08/08/2018   Hyperkalemia 03/12/2018   Renal cell carcinoma of left kidney (HCC) 12/24/2017   Fluid overload, unspecified 07/06/2017   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   Other specified disorders of kidney and ureter 11/22/2016   Secondary hyperparathyroidism of renal origin (HCC) 10/23/2016   Headache disorder 08/21/2016   TIA (transient ischemic attack)    Essential hypertension    Seizure (HCC)    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    Lung nodule    Diverticulitis of cecum 07/21/2015   Diverticulitis of large intestine without perforation or abscess without bleeding    Right lower quadrant pain    Obstructive sleep apnea 02/08/2015   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   Diverticulosis 07/13/2014   Lower GI bleed 07/12/2014   Refusal of blood transfusions as patient is Jehovah's Witness    Parotid mass    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/2014   HLD (hyperlipidemia)    PRES (posterior reversible encephalopathy syndrome)    History of CVA (cerebrovascular accident) 05/23/2014   Anemia of chronic disease 05/23/2014   GERD (gastroesophageal reflux disease) 12/09/2012   Gastrointestinal bleeding 11/12/2012   Melena 11/12/2012   Other psychoactive substance dependence, uncomplicated (HCC) 10/08/2012   Iron deficiency anemia, unspecified 06/10/2012   Moderate protein-calorie malnutrition (HCC) 07/29/2009   Coagulation defect, unspecified (HCC) 08/29/2006   Type 2 diabetes mellitus with diabetic peripheral angiopathy without gangrene (HCC) 08/29/2006   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 stage renal disease (HCC) 08/02/2006   Past Medical History:  Diagnosis Date   Acute gastric ulcer with hemorrhage    Acute GI bleeding 07/31/2019   Acute pericarditis    Anemia  hx of   Anxiety    situational    Arthritis    on meds   Borderline diabetes    Cataract    bilateral sx   Chronic headaches    Depression    situational    Diverticulitis    ESRD (end stage renal disease) (HCC)     On Renal Transplant List," [facility]; MWF" (10/23/2016)   GERD (gastroesophageal reflux disease)    with certain foods   GI bleed    Hypertension    diet controlled   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    Parathyroid  abnormality (HCC)    ectopic parathyroid  gland   Presence of arteriovenous fistula for hemodialysis, primary (HCC)    RUE PER PT RLE   Refusal of blood product    NO WHOLE BLOOD PROUCTS   Renal cell carcinoma (HCC)    s/p hand assisted laparoscopic bilateral nephrectomies 11/29/17, + RCC left   Secondary hyperparathyroidism (HCC)    Seizures (HCC)    one episode in past, due to" elevated Potassium" 08/02/20- "at least 4 years ago"   Sleep apnea    doesn't use CPAP anymore since weight loss   Stroke (HCC)    no residual     Family History  Problem Relation Age of Onset   Diabetes Father    Stroke Father    Hypertension Father    Uterine cancer Mother    Lupus Sister    Stroke Sister    Hypertension Sister    Anuerysm Brother        brain   Colon cancer Neg Hx    Esophageal cancer Neg Hx    Stomach cancer Neg Hx    Pancreatic cancer Neg Hx    Liver disease Neg Hx    Colon polyps Neg Hx    Rectal cancer Neg Hx     Past Surgical History:  Procedure Laterality Date   ANTERIOR CERVICAL CORPECTOMY N/A 05/25/2023   Procedure: ANTERIOR CERVICAL CORPECTOMY CERVICAL FOUR, CERVICAL THREE - CERVICAL FIVE FUSION;  Surgeon: [name], MD;  [facility];  Service: Neurosurgery;  Laterality: N/A;   AV FISTULA PLACEMENT Right    right arm   BIOPSY  08/01/2019   Procedure: BIOPSY;  Surgeon: Tami Falcon, MD;  Location: [facility] ENDOSCOPY;  Service: Endoscopy;;   BIOPSY  11/18/2020   Procedure: BIOPSY;  Surgeon: Elois Hair, MD;  Location: [facility] ENDOSCOPY;  Service: Gastroenterology;;   BIOPSY  09/13/2021   Procedure: BIOPSY;  Surgeon: Normie Becton., MD;  Location: [facility] ENDOSCOPY;  Service: Gastroenterology;;   CATARACT EXTRACTION W/ INTRAOCULAR LENS  IMPLANT, BILATERAL     COLON SURGERY     COLONOSCOPY N/A 08/04/2015   Procedure: COLONOSCOPY;  Surgeon: Nannette Babe, MD;  Location: [facility] ENDOSCOPY;  Service: Endoscopy;  Laterality: N/A;   COLONOSCOPY  2017   JMP@ [facility]-good prep-mass -recall 1 yr   COLONOSCOPY N/A 09/13/2021   Procedure: COLONOSCOPY;  Surgeon: Mansouraty, Albino Alu., MD;  Location: [facility] ENDOSCOPY;  Service: Gastroenterology;  Laterality: N/A;   COLONOSCOPY WITH PROPOFOL  N/A 11/25/2020   Procedure: COLONOSCOPY WITH PROPOFOL ;  Surgeon: Daina Drum, MD;  Location: [facility] ENDOSCOPY;  Service: Gastroenterology;  Laterality: N/A;   COLONOSCOPY WITH PROPOFOL  N/A 09/23/2021   Procedure: COLONOSCOPY WITH PROPOFOL ;  Surgeon: Annis Kinder, DO;  [facility] ENDOSCOPY;  Service: Gastroenterology;   Laterality: N/A;   ENTEROSCOPY N/A 09/23/2021   Procedure: ENTEROSCOPY;  Surgeon: Annis Kinder, DO;  [facility] ENDOSCOPY;  Service: Gastroenterology;  Laterality: N/A;  Will place order for video capsule study as we may opt to place it during procedure   ESOPHAGOGASTRODUODENOSCOPY N/A 08/01/2019   Procedure: ESOPHAGOGASTRODUODENOSCOPY (EGD);  Surgeon: Tami Falcon, MD;  Location: [facility] ENDOSCOPY;  Service: Endoscopy;  Laterality: N/A;   ESOPHAGOGASTRODUODENOSCOPY N/A 11/18/2020   Procedure: ESOPHAGOGASTRODUODENOSCOPY (EGD);  Surgeon: Elois Hair, MD;  Location: [facility] ENDOSCOPY;  Service: Gastroenterology;  Laterality: N/A;   ESOPHAGOGASTRODUODENOSCOPY (EGD) WITH PROPOFOL  N/A 08/04/2019   Procedure: ESOPHAGOGASTRODUODENOSCOPY (EGD) WITH PROPOFOL ;  Surgeon: Ace Holder, MD;  Location: [facility] ENDOSCOPY;  Service: Gastroenterology;  Laterality: N/A;   ESOPHAGOGASTRODUODENOSCOPY (EGD) WITH PROPOFOL  N/A 09/13/2021   Procedure: ESOPHAGOGASTRODUODENOSCOPY (EGD) WITH PROPOFOL ;  Surgeon: Brice Campi Albino Alu., MD;  Location: [facility] ENDOSCOPY;  Service: Gastroenterology;  Laterality: N/A;   GIVENS CAPSULE STUDY N/A 09/23/2021   Procedure: GIVENS CAPSULE STUDY;  Surgeon: Annis Kinder, DO;  [facility] ENDOSCOPY;  Service: Gastroenterology;  Laterality: N/A;   graft left arm Left    for dialysis x 2. Removed   HOT HEMOSTASIS  11/18/2020   Procedure: HOT HEMOSTASIS (ARGON PLASMA COAGULATION/BICAP);  Surgeon: Elois Hair, MD;  Location: [facility] ENDOSCOPY;  Service: Gastroenterology;;   HOT HEMOSTASIS N/A 09/23/2021   Procedure: HOT HEMOSTASIS (ARGON PLASMA COAGULATION/BICAP);  Surgeon: Annis Kinder, DO;  Location: [facility] ENDOSCOPY;  Service: Gastroenterology;  Laterality: N/A;   INSERTION OF DIALYSIS CATHETER     Rt chest   LAPAROSCOPIC RIGHT COLECTOMY N/A 08/05/2015   Procedure: LAPAROSCOPIC RIGHT COLECTOMY- ASCENDING;  Surgeon: Lockie Rima, MD;  Location: [facility] OR;  Service: General;   Laterality: N/A;   MASS EXCISION Left 05/28/2019   Procedure: EXCISION SOFT TISSUE MASS LEFT SHOULDER;  Surgeon: Oralee Billow, MD;  Location: [facility] ORS;  Service: General;  Laterality: Left;   NEPHRECTOMY Bilateral    PARATHYROIDECTOMY N/A 06/12/2016   Procedure: TOTAL PARATHYROIDECTOMY WITH AUTOTRANSPLANTATION TO LEFT FOREARM;  Surgeon: Oralee Billow, MD;  Location: [facility] OR;  Service: General;  Laterality: N/A;   PARATHYROIDECTOMY N/A 10/23/2016   Procedure: PARATHYROIDECTOMY;  Surgeon: Oralee Billow, MD;  Location: [facility] OR;  Service: General;  Laterality: N/A;   REVERSE SHOULDER ARTHROPLASTY Right 08/24/2020   Procedure: REVERSE SHOULDER ARTHROPLASTY;  Surgeon: Micheline Ahr, MD;  Location: [facility] OR;  Service: Orthopedics;  Laterality: Right;   REVISON OF ARTERIOVENOUS FISTULA Right 07/16/2017   Procedure: REVISION OF ARTERIOVENOUS FISTULA  Right ARM;  Surgeon: Adine Hoof, MD;  Location: [facility] OR;  Service: Vascular;  Laterality: Right;   TOTAL HIP ARTHROPLASTY Left 11/14/2020   Procedure: LEFT TOTAL HIP ARTHROPLASTY ANTERIOR APPROACH;  Surgeon: Wes Hamman, MD;  [facility] OR;  Service: Orthopedics;  Laterality: Left;   UPPER GASTROINTESTINAL ENDOSCOPY  2021   @ [facility]   Social History   Occupational History   Occupation: retired    Associate Professor: SELF EMPLOYED  Tobacco Use   Smoking status: Former    Current packs/day: 0.00    Types: Cigarettes    Quit date: 06/19/1990    Years since quitting: 33.1   Smokeless tobacco: Never   Tobacco comments:    rarely cigar  Vaping Use   Vaping status: Never Used  Substance and Sexual Activity   Alcohol use: Not Currently    Alcohol/week: 1.0 standard drink of alcohol    Types: 1 Cans of beer per week    Comment: occasional beer   Drug use: Not Currently    Types: Marijuana    Comment: Last use several yrs  ago   Sexual activity: Not Currently

## 2023-07-24 DIAGNOSIS — D689 Coagulation defect, unspecified: Secondary | ICD-10-CM | POA: Diagnosis not present

## 2023-07-24 DIAGNOSIS — R519 Headache, unspecified: Secondary | ICD-10-CM | POA: Diagnosis not present

## 2023-07-24 DIAGNOSIS — Z992 Dependence on renal dialysis: Secondary | ICD-10-CM | POA: Diagnosis not present

## 2023-07-24 DIAGNOSIS — N2581 Secondary hyperparathyroidism of renal origin: Secondary | ICD-10-CM | POA: Diagnosis not present

## 2023-07-24 DIAGNOSIS — N186 End stage renal disease: Secondary | ICD-10-CM | POA: Diagnosis not present

## 2023-07-25 DIAGNOSIS — S12390D Other displaced fracture of fourth cervical vertebra, subsequent encounter for fracture with routine healing: Secondary | ICD-10-CM | POA: Diagnosis not present

## 2023-07-26 DIAGNOSIS — D689 Coagulation defect, unspecified: Secondary | ICD-10-CM | POA: Diagnosis not present

## 2023-07-26 DIAGNOSIS — N186 End stage renal disease: Secondary | ICD-10-CM | POA: Diagnosis not present

## 2023-07-26 DIAGNOSIS — N2581 Secondary hyperparathyroidism of renal origin: Secondary | ICD-10-CM | POA: Diagnosis not present

## 2023-07-26 DIAGNOSIS — Z992 Dependence on renal dialysis: Secondary | ICD-10-CM | POA: Diagnosis not present

## 2023-07-26 DIAGNOSIS — R519 Headache, unspecified: Secondary | ICD-10-CM | POA: Diagnosis not present

## 2023-07-29 DIAGNOSIS — I959 Hypotension, unspecified: Secondary | ICD-10-CM | POA: Diagnosis not present

## 2023-07-29 DIAGNOSIS — E875 Hyperkalemia: Secondary | ICD-10-CM | POA: Diagnosis not present

## 2023-07-29 DIAGNOSIS — S12300D Unspecified displaced fracture of fourth cervical vertebra, subsequent encounter for fracture with routine healing: Secondary | ICD-10-CM | POA: Diagnosis not present

## 2023-07-29 DIAGNOSIS — E1122 Type 2 diabetes mellitus with diabetic chronic kidney disease: Secondary | ICD-10-CM | POA: Diagnosis not present

## 2023-07-29 DIAGNOSIS — N2581 Secondary hyperparathyroidism of renal origin: Secondary | ICD-10-CM | POA: Diagnosis not present

## 2023-07-29 DIAGNOSIS — N186 End stage renal disease: Secondary | ICD-10-CM | POA: Diagnosis not present

## 2023-07-29 DIAGNOSIS — K219 Gastro-esophageal reflux disease without esophagitis: Secondary | ICD-10-CM | POA: Diagnosis not present

## 2023-07-29 DIAGNOSIS — R519 Headache, unspecified: Secondary | ICD-10-CM | POA: Diagnosis not present

## 2023-07-29 DIAGNOSIS — D631 Anemia in chronic kidney disease: Secondary | ICD-10-CM | POA: Diagnosis not present

## 2023-07-29 DIAGNOSIS — D689 Coagulation defect, unspecified: Secondary | ICD-10-CM | POA: Diagnosis not present

## 2023-07-29 DIAGNOSIS — Z992 Dependence on renal dialysis: Secondary | ICD-10-CM | POA: Diagnosis not present

## 2023-07-29 DIAGNOSIS — M25551 Pain in right hip: Secondary | ICD-10-CM | POA: Diagnosis not present

## 2023-07-31 DIAGNOSIS — N2581 Secondary hyperparathyroidism of renal origin: Secondary | ICD-10-CM | POA: Diagnosis not present

## 2023-07-31 DIAGNOSIS — N186 End stage renal disease: Secondary | ICD-10-CM | POA: Diagnosis not present

## 2023-07-31 DIAGNOSIS — R519 Headache, unspecified: Secondary | ICD-10-CM | POA: Diagnosis not present

## 2023-07-31 DIAGNOSIS — Z992 Dependence on renal dialysis: Secondary | ICD-10-CM | POA: Diagnosis not present

## 2023-07-31 DIAGNOSIS — D689 Coagulation defect, unspecified: Secondary | ICD-10-CM | POA: Diagnosis not present

## 2023-08-02 DIAGNOSIS — N2581 Secondary hyperparathyroidism of renal origin: Secondary | ICD-10-CM | POA: Diagnosis not present

## 2023-08-02 DIAGNOSIS — R519 Headache, unspecified: Secondary | ICD-10-CM | POA: Diagnosis not present

## 2023-08-02 DIAGNOSIS — N186 End stage renal disease: Secondary | ICD-10-CM | POA: Diagnosis not present

## 2023-08-02 DIAGNOSIS — Z992 Dependence on renal dialysis: Secondary | ICD-10-CM | POA: Diagnosis not present

## 2023-08-02 DIAGNOSIS — D689 Coagulation defect, unspecified: Secondary | ICD-10-CM | POA: Diagnosis not present

## 2023-08-05 ENCOUNTER — Ambulatory Visit (INDEPENDENT_AMBULATORY_CARE_PROVIDER_SITE_OTHER): Admitting: Podiatry

## 2023-08-05 DIAGNOSIS — B351 Tinea unguium: Secondary | ICD-10-CM

## 2023-08-05 DIAGNOSIS — N186 End stage renal disease: Secondary | ICD-10-CM | POA: Diagnosis not present

## 2023-08-05 DIAGNOSIS — Z992 Dependence on renal dialysis: Secondary | ICD-10-CM | POA: Diagnosis not present

## 2023-08-05 DIAGNOSIS — N2581 Secondary hyperparathyroidism of renal origin: Secondary | ICD-10-CM | POA: Diagnosis not present

## 2023-08-05 DIAGNOSIS — D689 Coagulation defect, unspecified: Secondary | ICD-10-CM | POA: Diagnosis not present

## 2023-08-05 DIAGNOSIS — E1151 Type 2 diabetes mellitus with diabetic peripheral angiopathy without gangrene: Secondary | ICD-10-CM

## 2023-08-05 DIAGNOSIS — M79674 Pain in right toe(s): Secondary | ICD-10-CM

## 2023-08-05 DIAGNOSIS — M79675 Pain in left toe(s): Secondary | ICD-10-CM

## 2023-08-05 DIAGNOSIS — R519 Headache, unspecified: Secondary | ICD-10-CM | POA: Diagnosis not present

## 2023-08-05 NOTE — Progress Notes (Signed)
 Subjective:  Patient ID: Carl Curls Sr., male    DOB: [birth year],  MRN: [id number]  Carl Curls Sr. presents to clinic today for:  Chief Complaint  Patient presents with   Nail Problem    Nail trim    Patient notes nails are thick, discolored, elongated and painful in shoegear when trying to ambulate.  He recently underwent cervical spine surgery.  He was in the hospital for 30 days and then a rehab facility, but is now home.  Last glucose level on 07/03/2023 was 147 mg/dL.  PCP is Regino, Dibas, MD.  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    Parathyroid  abnormality (HCC)    ectopic parathyroid  gland   Presence of arteriovenous fistula for hemodialysis, primary (HCC)    RUE PER PT RLE   Refusal of blood product    NO WHOLE BLOOD PROUCTS   Renal cell carcinoma (HCC)    s/p hand assisted laparoscopic bilateral nephrectomies 11/29/17, + RCC left   Secondary hyperparathyroidism (HCC)    Seizures (HCC)    one episode in past, due to elevated Potassium 08/02/20- at least 4 years ago   Sleep apnea    doesn't use CPAP anymore since weight loss   Stroke [facility])    no residual    Past Surgical History:  Procedure Laterality Date   ANTERIOR CERVICAL CORPECTOMY N/A 05/25/2023   Procedure: ANTERIOR CERVICAL CORPECTOMY CERVICAL FOUR, CERVICAL THREE - CERVICAL FIVE FUSION;  Surgeon: [name] Shove, MD;  [facility] OR;  Service: Neurosurgery;   Laterality: N/A;   AV FISTULA PLACEMENT Right    right arm   BIOPSY  08/01/2019   Procedure: BIOPSY;  Surgeon: Kristie Lamprey, MD;  Location: [facility] ENDOSCOPY;  Service: Endoscopy;;   BIOPSY  11/18/2020   Procedure: BIOPSY;  Surgeon: Stacia Glendia BRAVO, MD;  Location: [facility] ENDOSCOPY;  Service: Gastroenterology;;   BIOPSY  09/13/2021   Procedure: BIOPSY;  Surgeon: Wilhelmenia Aloha Raddle., MD;  Location: [facility] ENDOSCOPY;  Service: Gastroenterology;;   CATARACT EXTRACTION W/ INTRAOCULAR LENS  IMPLANT, BILATERAL     COLON SURGERY     COLONOSCOPY N/A 08/04/2015   Procedure: COLONOSCOPY;  Surgeon: Gordy CHRISTELLA Starch, MD;  Location: [facility] ENDOSCOPY;  Service: Endoscopy;  Laterality: N/A;   COLONOSCOPY  2017   JMP@ [facility]-good prep-mass -recall 1 yr   COLONOSCOPY N/A 09/13/2021   Procedure: COLONOSCOPY;  Surgeon: Mansouraty, Aloha Raddle., MD;  Location: [facility] ENDOSCOPY;  Service: Gastroenterology;  Laterality: N/A;   COLONOSCOPY WITH PROPOFOL  N/A 11/25/2020   Procedure: COLONOSCOPY WITH PROPOFOL ;  Surgeon: Federico Rosario BROCKS, MD;  Location: [facility] ENDOSCOPY;  Service: Gastroenterology;  Laterality: N/A;   COLONOSCOPY WITH PROPOFOL  N/A 09/23/2021   Procedure: COLONOSCOPY WITH PROPOFOL ;  Surgeon: San Sandor GAILS, DO;  Location:  [facility] ENDOSCOPY;  Service: Gastroenterology;  Laterality: N/A;   ENTEROSCOPY N/A 09/23/2021   Procedure: ENTEROSCOPY;  Surgeon: San Sandor GAILS, DO;  Location: [facility] ENDOSCOPY;  Service: Gastroenterology;  Laterality: N/A;  Will place order for video capsule study as we may opt to place it during procedure   ESOPHAGOGASTRODUODENOSCOPY N/A 08/01/2019   Procedure: ESOPHAGOGASTRODUODENOSCOPY (EGD);  Surgeon: Kristie Lamprey, MD;  Location: [facility] ENDOSCOPY;  Service: Endoscopy;  Laterality: N/A;   ESOPHAGOGASTRODUODENOSCOPY N/A 11/18/2020   Procedure: ESOPHAGOGASTRODUODENOSCOPY (EGD);  Surgeon: Stacia Glendia BRAVO, MD;  Location: [facility] ENDOSCOPY;  Service: Gastroenterology;  Laterality: N/A;   ESOPHAGOGASTRODUODENOSCOPY (EGD)  WITH PROPOFOL  N/A 08/04/2019   Procedure: ESOPHAGOGASTRODUODENOSCOPY (EGD) WITH PROPOFOL ;  Surgeon: Leigh Elspeth SQUIBB, MD;  Location: [facility] ENDOSCOPY;  Service: Gastroenterology;  Laterality: N/A;   ESOPHAGOGASTRODUODENOSCOPY (EGD) WITH PROPOFOL  N/A 09/13/2021   Procedure: ESOPHAGOGASTRODUODENOSCOPY (EGD) WITH PROPOFOL ;  Surgeon: Wilhelmenia Aloha Raddle., MD;  Location: [facility] ENDOSCOPY;  Service: Gastroenterology;  Laterality: N/A;   GIVENS CAPSULE STUDY N/A 09/23/2021   Procedure: GIVENS CAPSULE STUDY;  Surgeon: San Sandor GAILS, DO;  [facility] ENDOSCOPY;  Service: Gastroenterology;  Laterality: N/A;   graft left arm Left    for dialysis x 2. Removed   HOT HEMOSTASIS  11/18/2020   Procedure: HOT HEMOSTASIS (ARGON PLASMA COAGULATION/BICAP);  Surgeon: Stacia Glendia BRAVO, MD;  Location: [facility] ENDOSCOPY;  Service: Gastroenterology;;   HOT HEMOSTASIS N/A 09/23/2021   Procedure: HOT HEMOSTASIS (ARGON PLASMA COAGULATION/BICAP);  Surgeon: San Sandor GAILS, DO;  Location: [facility] ENDOSCOPY;  Service: Gastroenterology;  Laterality: N/A;   INSERTION OF DIALYSIS CATHETER     Rt chest   LAPAROSCOPIC RIGHT COLECTOMY N/A 08/05/2015   Procedure: LAPAROSCOPIC RIGHT COLECTOMY- ASCENDING;  Surgeon: Jina Nephew, MD;  Location: [facility] OR;  Service: General;  Laterality: N/A;   MASS EXCISION Left 05/28/2019   Procedure: EXCISION SOFT TISSUE MASS LEFT SHOULDER;  Surgeon: Eletha Boas, MD;  Location: [facility] ORS;  Service: General;  Laterality: Left;   NEPHRECTOMY Bilateral    PARATHYROIDECTOMY N/A 06/12/2016   Procedure: TOTAL PARATHYROIDECTOMY WITH AUTOTRANSPLANTATION TO LEFT FOREARM;  Surgeon: Boas Eletha, MD;  Location: [facility] OR;  Service: General;  Laterality: N/A;   PARATHYROIDECTOMY N/A 10/23/2016   Procedure: PARATHYROIDECTOMY;  Surgeon: Eletha Boas, MD;  Location: [facility] OR;  Service: General;  Laterality: N/A;   REVERSE SHOULDER ARTHROPLASTY Right 08/24/2020   Procedure: REVERSE SHOULDER ARTHROPLASTY;  Surgeon: Cristy Bonner DASEN,  MD;  Location: [facility] OR;  Service: Orthopedics;  Laterality: Right;   REVISON OF ARTERIOVENOUS FISTULA Right 07/16/2017   Procedure: REVISION OF ARTERIOVENOUS FISTULA  Right ARM;  Surgeon: Sheree Penne Bruckner, MD;  Location: [facility] OR;  Service: Vascular;  Laterality: Right;   TOTAL HIP ARTHROPLASTY Left 11/14/2020   Procedure: LEFT TOTAL HIP ARTHROPLASTY ANTERIOR APPROACH;  Surgeon: [name] Kay HERO, MD;  Location: [facility] OR;  Service: Orthopedics;  Laterality: Left;   UPPER GASTROINTESTINAL ENDOSCOPY  2021   @ [facility]    Allergies  Allergen Reactions   Infed [Iron Dextran] Other (See Comments)    Decreased BP    Vicodin [Hydrocodone -Acetaminophen ] Other (See Comments)    Decreased BP   Acetaminophen     Hydrocodone     Diclofenac Other (See Comments)    Caused hypotension    Review of Systems: Negative except as noted in the HPI.  Objective:  Alijah Akram Sr. is a pleasant 63 y.o. male in NAD. AAO x 3.  Vascular Examination: Capillary refill time is 3-5 seconds to toes bilateral. Palpable pedal pulses b/l LE.  Digital hair present b/l.  Skin temperature gradient WNL b/l. No varicosities b/l. No cyanosis noted b/l.   Dermatological Examination: Pedal skin with normal turgor, texture and tone b/l. No open wounds. No interdigital macerations b/l. Toenails x10 are 3mm thick, discolored, dystrophic with subungual debris. There is pain with compression of the nail plates.  They are elongated x10  Assessment/Plan: 1. Pain due to onychomycosis of toenails of both feet   2. Type 2 diabetes mellitus with diabetic peripheral angiopathy without gangrene, without long-term current use of insulin  (HCC)    The mycotic toenails were sharply debrided x10 with sterile nail nippers and a power debriding burr to decrease bulk/thickness and length.    Follow-up in 3 months for diabetic footcare.  Awanda CHARM Imperial, DPM, FACFAS [facility]     [address] [place], [place] [address]                Office [phone]  Fax [phone]

## 2023-08-07 DIAGNOSIS — D689 Coagulation defect, unspecified: Secondary | ICD-10-CM | POA: Diagnosis not present

## 2023-08-07 DIAGNOSIS — N186 End stage renal disease: Secondary | ICD-10-CM | POA: Diagnosis not present

## 2023-08-07 DIAGNOSIS — Z992 Dependence on renal dialysis: Secondary | ICD-10-CM | POA: Diagnosis not present

## 2023-08-07 DIAGNOSIS — R519 Headache, unspecified: Secondary | ICD-10-CM | POA: Diagnosis not present

## 2023-08-07 DIAGNOSIS — N2581 Secondary hyperparathyroidism of renal origin: Secondary | ICD-10-CM | POA: Diagnosis not present

## 2023-08-08 DIAGNOSIS — H6123 Impacted cerumen, bilateral: Secondary | ICD-10-CM | POA: Diagnosis not present

## 2023-08-08 DIAGNOSIS — G63 Polyneuropathy in diseases classified elsewhere: Secondary | ICD-10-CM | POA: Diagnosis not present

## 2023-08-08 DIAGNOSIS — Z992 Dependence on renal dialysis: Secondary | ICD-10-CM | POA: Diagnosis not present

## 2023-08-08 DIAGNOSIS — I10 Essential (primary) hypertension: Secondary | ICD-10-CM | POA: Diagnosis not present

## 2023-08-08 DIAGNOSIS — N186 End stage renal disease: Secondary | ICD-10-CM | POA: Diagnosis not present

## 2023-08-09 DIAGNOSIS — N186 End stage renal disease: Secondary | ICD-10-CM | POA: Diagnosis not present

## 2023-08-09 DIAGNOSIS — N2581 Secondary hyperparathyroidism of renal origin: Secondary | ICD-10-CM | POA: Diagnosis not present

## 2023-08-09 DIAGNOSIS — D689 Coagulation defect, unspecified: Secondary | ICD-10-CM | POA: Diagnosis not present

## 2023-08-09 DIAGNOSIS — Z992 Dependence on renal dialysis: Secondary | ICD-10-CM | POA: Diagnosis not present

## 2023-08-09 DIAGNOSIS — R519 Headache, unspecified: Secondary | ICD-10-CM | POA: Diagnosis not present

## 2023-08-12 DIAGNOSIS — R519 Headache, unspecified: Secondary | ICD-10-CM | POA: Diagnosis not present

## 2023-08-12 DIAGNOSIS — Z992 Dependence on renal dialysis: Secondary | ICD-10-CM | POA: Diagnosis not present

## 2023-08-12 DIAGNOSIS — N2581 Secondary hyperparathyroidism of renal origin: Secondary | ICD-10-CM | POA: Diagnosis not present

## 2023-08-12 DIAGNOSIS — I12 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 stage renal disease: Secondary | ICD-10-CM | POA: Diagnosis not present

## 2023-08-12 DIAGNOSIS — N186 End stage renal disease: Secondary | ICD-10-CM | POA: Diagnosis not present

## 2023-08-12 DIAGNOSIS — D689 Coagulation defect, unspecified: Secondary | ICD-10-CM | POA: Diagnosis not present

## 2023-08-14 DIAGNOSIS — D689 Coagulation defect, unspecified: Secondary | ICD-10-CM | POA: Diagnosis not present

## 2023-08-14 DIAGNOSIS — D631 Anemia in chronic kidney disease: Secondary | ICD-10-CM | POA: Diagnosis not present

## 2023-08-14 DIAGNOSIS — Z992 Dependence on renal dialysis: Secondary | ICD-10-CM | POA: Diagnosis not present

## 2023-08-14 DIAGNOSIS — N2581 Secondary hyperparathyroidism of renal origin: Secondary | ICD-10-CM | POA: Diagnosis not present

## 2023-08-14 DIAGNOSIS — N186 End stage renal disease: Secondary | ICD-10-CM | POA: Diagnosis not present

## 2023-08-14 DIAGNOSIS — R519 Headache, unspecified: Secondary | ICD-10-CM | POA: Diagnosis not present

## 2023-08-14 DIAGNOSIS — R197 Diarrhea, unspecified: Secondary | ICD-10-CM | POA: Diagnosis not present

## 2023-08-16 DIAGNOSIS — N2581 Secondary hyperparathyroidism of renal origin: Secondary | ICD-10-CM | POA: Diagnosis not present

## 2023-08-16 DIAGNOSIS — R519 Headache, unspecified: Secondary | ICD-10-CM | POA: Diagnosis not present

## 2023-08-16 DIAGNOSIS — N186 End stage renal disease: Secondary | ICD-10-CM | POA: Diagnosis not present

## 2023-08-16 DIAGNOSIS — R197 Diarrhea, unspecified: Secondary | ICD-10-CM | POA: Diagnosis not present

## 2023-08-16 DIAGNOSIS — D631 Anemia in chronic kidney disease: Secondary | ICD-10-CM | POA: Diagnosis not present

## 2023-08-16 DIAGNOSIS — Z992 Dependence on renal dialysis: Secondary | ICD-10-CM | POA: Diagnosis not present

## 2023-08-16 DIAGNOSIS — D689 Coagulation defect, unspecified: Secondary | ICD-10-CM | POA: Diagnosis not present

## 2023-08-19 ENCOUNTER — Encounter: Attending: Registered Nurse | Admitting: Registered Nurse

## 2023-08-19 VITALS — BP 100/65 | HR 87 | Ht 73.0 in | Wt 184.0 lb

## 2023-08-19 DIAGNOSIS — D689 Coagulation defect, unspecified: Secondary | ICD-10-CM | POA: Diagnosis not present

## 2023-08-19 DIAGNOSIS — N186 End stage renal disease: Secondary | ICD-10-CM

## 2023-08-19 DIAGNOSIS — D631 Anemia in chronic kidney disease: Secondary | ICD-10-CM | POA: Diagnosis not present

## 2023-08-19 DIAGNOSIS — S14109S Unspecified injury at unspecified level of cervical spinal cord, sequela: Secondary | ICD-10-CM

## 2023-08-19 DIAGNOSIS — E7849 Other hyperlipidemia: Secondary | ICD-10-CM | POA: Diagnosis not present

## 2023-08-19 DIAGNOSIS — R197 Diarrhea, unspecified: Secondary | ICD-10-CM | POA: Diagnosis not present

## 2023-08-19 DIAGNOSIS — Z992 Dependence on renal dialysis: Secondary | ICD-10-CM | POA: Diagnosis not present

## 2023-08-19 DIAGNOSIS — N2581 Secondary hyperparathyroidism of renal origin: Secondary | ICD-10-CM | POA: Diagnosis not present

## 2023-08-19 DIAGNOSIS — R519 Headache, unspecified: Secondary | ICD-10-CM | POA: Diagnosis not present

## 2023-08-19 NOTE — Progress Notes (Signed)
 aS\\\\\\\\\\\\\\  Subjective:    Patient ID: Carl Baize Sr., male    DOB: 09/12/1960, 63 y.o.   MRN: [id number]  HPI: Carl Carl Sr. is a 63 y.o. male who returns for Hospital follow  up of his Spinal Cord Injury, cervical Region, Hyperlipidemia and ESRD. He presented to ED on 05/25/2023 with complaints of progressive numbness with pain in his bilateral upper extremities.   Dr Lee H&P Note:  HPI:  Carl Fouse Sr. is a 63 y.o. male with medical history significant of ESRD on dialysis MWF schedule, history of GI bleed, GERD, OSA, seizure, essential hypertension and prior CVA presented emergency department after a fall and experiencing bilateral upper lower extremity significant weakness since the fall. Patient reported that he took around 3 steps suddenly knee gave out and fall on the floor facing forward and is unable to get up due to significant upper lower extremity weakness.  Reporting that his legs feeling heavy cannot lift up also bilateral arm and forearm feeling weaker.  He drove himself to the emergency department and in the ED initially code stroke was activated.   Patient denying any loss of consciousness, blurry vision and seizure.  Unable to verify patient has any tingling in the bilateral upper and lower extremities.  CT Head : WO Contrast:  MPRESSION: 1. Small acute left PCA distribution infarct. No acute intracranial hemorrhage. 2. Aspects equals 10. 3. Soft tissue contusion at the left frontal scalp. No calvarial fracture. No acute intracranial hemorrhage. 4. Underlying atrophy with chronic small vessel ischemic disease. Chronic lacunar infarct at the left basal ganglia.   CT: Cervical Spine MPRESSION: 1. Evolving subacute fracture involving the C4 vertebral body with up to 50% height loss and 4 mm bony retropulsion. Extension through the right posterior elements/articular facet of C4. Fracture is pathologic in appearance, suspected to be related to  underlying renal osteodystrophy. 2. Underlying advanced degenerative spondylosis at C4-5 and C5-6.   MR Brain: WO Contrast IMPRESSION: 1. No acute intracranial abnormality. 2. Small soft tissue contusion at the left frontal scalp. 3. Chronic left PCA distribution infarct. 4. Underlying mild chronic microvascular ischemic disease with remote lacunar infarct at the anterior left basal ganglia. 5. Probable evolving C4 fracture, better seen on corresponding MRI of the cervical spine. 6. Large left mastoid and middle ear effusion. Several small retention cyst present at the nasopharynx, suggesting that this finding is postobstructive in nature.   MR: Cervical Spine    IMPRESSION: 1. Evolving C4 fracture with 50% height loss and 4 mm bony retropulsion, subacute in appearance, and likely pathologic due to underlying renal osteodystrophy as postulated on prior CT. Underlying infection could conceivably have this appearance as well, and could be considered in the correct clinical setting. 2. Resultant severe spinal stenosis at the level of C4. Patchy signal abnormality involving the cervical cord extending from C3 through C6, consistent with edema and/or evolving myelomalacia. 3. Multifactorial degenerative changes at C3-4 and C5-6 with resultant moderate spinal stenosis at both levels. Moderate right C4, bilateral C5, left C6 foraminal narrowing.  Neurosurgery Consulted : He was emergently taken to the OR:  He underwent: on 05/25/2023: Dr [name] ANTERIOR CERVICAL CORPECTOMY CERVICAL FOUR, CERVICAL THREE - CERVICAL FIVE FUSION N/A   [name] was admitted to inpatient Rehabilitation on 05/30/2023 and Discharged to SNF on 07/15/2023.   He states he has tingling in his hands, Right hip and Right knee pain. He rates his pain 5.   Right AVF: + bruit and Thrill  Son in room.    Pain Inventory Average Pain 5 Pain Right Now 5 My pain is burning and aching  In the last 24 hours,  has pain interfered with the following? General activity 2 Relation with others 0 Enjoyment of life 6 What TIME of day is your pain at its worst? varies Sleep (in general) Fair  Pain is worse with: walking, bending, sitting, standing, and some activites Pain improves with: therapy/exercise and medication Relief from Meds: 5  use a walker how many minutes can you walk? 10 ability to climb steps?  yes do you drive?  no  disabled: date disabled 15 years retired I need assistance with the following:  meal prep, household duties, and shopping  numbness trouble walking depression  Hospital f/u  Hospital f/u    Family History  Problem Relation Age of Onset   Diabetes Father    Stroke Father    Hypertension Father    Uterine cancer Mother    Lupus Sister    Stroke Sister    Hypertension Sister    Anuerysm Brother        brain   Colon cancer Neg Hx    Esophageal cancer Neg Hx    Stomach cancer Neg Hx    Pancreatic cancer Neg Hx    Liver disease Neg Hx    Colon polyps Neg Hx    Rectal cancer Neg Hx    Social History   Socioeconomic History   Marital status: Single    Spouse name: Not on file   Number of children: 2   Years of education: Not on file   Highest education level: Not on file  Occupational History   Occupation: retired    Associate Professor: SELF EMPLOYED  Tobacco Use   Smoking status: Former    Current packs/day: 0.00    Types: Cigarettes    Quit date: 06/19/1990    Years since quitting: 33.1   Smokeless tobacco: Never   Tobacco comments:    rarely cigar  Vaping Use   Vaping status: Never Used  Substance and Sexual Activity   Alcohol use: Not Currently    Alcohol/week: 1.0 standard drink of alcohol    Types: 1 Cans of beer per week    Comment: occasional beer   Drug use: Not Currently    Types: Marijuana    Comment: Last use several yrs ago   Sexual activity: Not Currently  Other Topics Concern   Not on file  Social History Narrative   Lives  alone at home with brother   12 years of education    2 children    Not married    Are you right handed or left handed? Rtight   Are you currently employed ? retired   What is your current occupation?   Do you live at home alone?   Who lives with you?    What type of home do you live in: 1 story or 2 story? One   Caffeine  1 cup a day       Social Drivers of Corporate investment banker Strain: Not on file  Food Insecurity: No Food Insecurity (06/19/2023)   Hunger Vital Sign    Worried About Running Out of Food in the Last Year: Never true    Ran Out of Food in the Last Year: Never true  Transportation Needs: No Transportation Needs (06/19/2023)   PRAPARE - Transportation    Lack of Transportation (Medical): No    Lack  of Transportation (Non-Medical): No  Physical Activity: Not on file  Stress: Not on file  Social Connections: Unknown (06/26/2021)   Received from [facility]   Social Network    Social Network: Not on file   Past Surgical History:  Procedure Laterality Date   ANTERIOR CERVICAL CORPECTOMY N/A 05/25/2023   Procedure: ANTERIOR CERVICAL CORPECTOMY CERVICAL FOUR, CERVICAL THREE - CERVICAL FIVE FUSION;  Surgeon: [name] Shove, MD;  Location: [facility] OR;  Service: Neurosurgery;  Laterality: N/A;   AV FISTULA PLACEMENT Right    right arm   BIOPSY  08/01/2019   Procedure: BIOPSY;  Surgeon: Kristie Lamprey, MD;  Location: [facility] ENDOSCOPY;  Service: Endoscopy;;   BIOPSY  11/18/2020   Procedure: BIOPSY;  Surgeon: Stacia Glendia BRAVO, MD;  Location: [facility] ENDOSCOPY;  Service: Gastroenterology;;   BIOPSY  09/13/2021   Procedure: BIOPSY;  Surgeon: Wilhelmenia Aloha Raddle., MD;  Location: [facility] ENDOSCOPY;  Service: Gastroenterology;;   CATARACT EXTRACTION W/ INTRAOCULAR LENS  IMPLANT, BILATERAL     COLON SURGERY     COLONOSCOPY N/A 08/04/2015   Procedure: COLONOSCOPY;  Surgeon: Gordy CHRISTELLA Starch, MD;  Location: [facility] ENDOSCOPY;  Service: Endoscopy;  Laterality: N/A;   COLONOSCOPY  2017   JMP@ [facility]-good  prep-mass -recall 1 yr   COLONOSCOPY N/A 09/13/2021   Procedure: COLONOSCOPY;  Surgeon: Mansouraty, Aloha Raddle., MD;  Location: [facility] ENDOSCOPY;  Service: Gastroenterology;  Laterality: N/A;   COLONOSCOPY WITH PROPOFOL  N/A 11/25/2020   Procedure: COLONOSCOPY WITH PROPOFOL ;  Surgeon: Federico Rosario BROCKS, MD;  Location: [facility] ENDOSCOPY;  Service: Gastroenterology;  Laterality: N/A;   COLONOSCOPY WITH PROPOFOL  N/A 09/23/2021   Procedure: COLONOSCOPY WITH PROPOFOL ;  Surgeon: San Sandor GAILS, DO;  Location: [facility] ENDOSCOPY;  Service: Gastroenterology;  Laterality: N/A;   ENTEROSCOPY N/A 09/23/2021   Procedure: ENTEROSCOPY;  Surgeon: San Sandor GAILS, DO;  Location: [facility] ENDOSCOPY;  Service: Gastroenterology;  Laterality: N/A;  Will place order for video capsule study as we may opt to place it during procedure   ESOPHAGOGASTRODUODENOSCOPY N/A 08/01/2019   Procedure: ESOPHAGOGASTRODUODENOSCOPY (EGD);  Surgeon: Kristie Lamprey, MD;  Location: [facility] ENDOSCOPY;  Service: Endoscopy;  Laterality: N/A;   ESOPHAGOGASTRODUODENOSCOPY N/A 11/18/2020   Procedure: ESOPHAGOGASTRODUODENOSCOPY (EGD);  Surgeon: Stacia Glendia BRAVO, MD;  Location: [facility] ENDOSCOPY;  Service: Gastroenterology;  Laterality: N/A;   ESOPHAGOGASTRODUODENOSCOPY (EGD) WITH PROPOFOL  N/A 08/04/2019   Procedure: ESOPHAGOGASTRODUODENOSCOPY (EGD) WITH PROPOFOL ;  Surgeon: Leigh Elspeth SQUIBB, MD;  Location: [facility] ENDOSCOPY;  Service: Gastroenterology;  Laterality: N/A;   ESOPHAGOGASTRODUODENOSCOPY (EGD) WITH PROPOFOL  N/A 09/13/2021   Procedure: ESOPHAGOGASTRODUODENOSCOPY (EGD) WITH PROPOFOL ;  Surgeon: Wilhelmenia Aloha Raddle., MD;  Location: [facility] ENDOSCOPY;  Service: Gastroenterology;  Laterality: N/A;   GIVENS CAPSULE STUDY N/A 09/23/2021   Procedure: GIVENS CAPSULE STUDY;  Surgeon: San Sandor GAILS, DO;  Location: [facility] ENDOSCOPY;  Service: Gastroenterology;  Laterality: N/A;   graft left arm Left    for dialysis x 2. Removed   HOT HEMOSTASIS  11/18/2020   Procedure: HOT  HEMOSTASIS (ARGON PLASMA COAGULATION/BICAP);  Surgeon: Stacia Glendia BRAVO, MD;  Location: [facility] ENDOSCOPY;  Service: Gastroenterology;;   HOT HEMOSTASIS N/A 09/23/2021   Procedure: HOT HEMOSTASIS (ARGON PLASMA COAGULATION/BICAP);  Surgeon: San Sandor GAILS, DO;  Location: [facility] ENDOSCOPY;  Service: Gastroenterology;  Laterality: N/A;   INSERTION OF DIALYSIS CATHETER     Rt chest   LAPAROSCOPIC RIGHT COLECTOMY N/A 08/05/2015   Procedure: LAPAROSCOPIC RIGHT COLECTOMY- ASCENDING;  Surgeon: Jina Nephew, MD;  Location: [facility] OR;  Service: General;  Laterality: N/A;   MASS EXCISION  Left 05/28/2019   Procedure: EXCISION SOFT TISSUE MASS LEFT SHOULDER;  Surgeon: Eletha Boas, MD;  Location: [facility] ORS;  Service: General;  Laterality: Left;   NEPHRECTOMY Bilateral    PARATHYROIDECTOMY N/A 06/12/2016   Procedure: TOTAL PARATHYROIDECTOMY WITH AUTOTRANSPLANTATION TO LEFT FOREARM;  Surgeon: Boas Eletha, MD;  Location: [facility] OR;  Service: General;  Laterality: N/A;   PARATHYROIDECTOMY N/A 10/23/2016   Procedure: PARATHYROIDECTOMY;  Surgeon: Eletha Boas, MD;  Location: [facility] OR;  Service: General;  Laterality: N/A;   REVERSE SHOULDER ARTHROPLASTY Right 08/24/2020   Procedure: REVERSE SHOULDER ARTHROPLASTY;  Surgeon: Cristy Bonner DASEN, MD;  Location: [facility] OR;  Service: Orthopedics;  Laterality: Right;   REVISON OF ARTERIOVENOUS FISTULA Right 07/16/2017   Procedure: REVISION OF ARTERIOVENOUS FISTULA  Right ARM;  Surgeon: Sheree Penne Bruckner, MD;  Location: [facility] OR;  Service: Vascular;  Laterality: Right;   TOTAL HIP ARTHROPLASTY Left 11/14/2020   Procedure: LEFT TOTAL HIP ARTHROPLASTY ANTERIOR APPROACH;  Surgeon: [name] Kay HERO, MD;  Location: [facility] OR;  Service: Orthopedics;  Laterality: Left;   UPPER GASTROINTESTINAL ENDOSCOPY  2021   @ [facility]   Past Medical History:  Diagnosis Date   Acute gastric ulcer with hemorrhage    Acute GI bleeding 07/31/2019   Acute pericarditis    Anemia    hx of   Anxiety    situational     Arthritis    on meds   Borderline diabetes    Cataract    bilateral sx   Chronic headaches    Depression    situational    Diverticulitis    ESRD (end stage renal disease) (HCC)     On Renal Transplant List, [facility]; MWF (10/23/2016)   GERD (gastroesophageal reflux disease)    with certain foods   GI bleed    Hypertension    diet controlled   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    Parathyroid  abnormality (HCC)    ectopic parathyroid  gland   Presence of arteriovenous fistula for hemodialysis, primary (HCC)    RUE PER PT RLE   Refusal of blood product    NO WHOLE BLOOD PROUCTS   Renal cell carcinoma (HCC)    s/p hand assisted laparoscopic bilateral nephrectomies 11/29/17, + RCC left   Secondary hyperparathyroidism (HCC)    Seizures (HCC)    one episode in past, due to elevated Potassium 08/02/20- at least 4 years ago   Sleep apnea    doesn't use CPAP anymore since weight loss   Stroke (HCC)    no residual   BP 100/65 (BP Location: Left Arm, Patient Position: Sitting, Cuff Size: Large)   Pulse 87   Ht 6' 1 (1.854 m)   Wt 184 lb (83.5 kg)   SpO2 98%   BMI 24.28 kg/m   Opioid Risk Score:   Fall Risk Score:  `1  Depression screen PHQ 2/9      No data to display            Review of Systems  Musculoskeletal:        Bilateral knee pain Bilateral arm pain Right hip and shoulder pain  All other systems reviewed and are negative.      Objective:   Physical Exam Vitals and nursing note reviewed.  Constitutional:      Appearance: Normal appearance.  Cardiovascular:     Rate and Rhythm: Normal rate and regular rhythm.     Pulses: Normal pulses.     Heart sounds: Normal heart sounds.  Pulmonary:     Effort: Pulmonary effort is normal.     Breath sounds: Normal breath sounds.  Musculoskeletal:     Comments: Normal Muscle Bulk and Muscle Testing Reveals:  Upper Extremities: Decreased ROM  90 Degrees and Muscle Strength 3/5  Lower Extremities:  Decreased ROM and Muscle Strength 5/5 Bilateral Lower Extremity Flexion Produces Pain into her Bilateral Patella Arises from Table with Walker  Narrow Based Gait     Skin:    General: Skin is warm and dry.  Neurological:     Mental Status: He is alert and oriented to person, place, and time.  Psychiatric:        Mood and Affect: Mood normal.        Behavior: Behavior normal.          Assessment & Plan:  Spinal Cord Injury, cervical Region: S/P : Neurosurgery Consulted : He was emergently taken to the OR:  He underwent: on 05/25/2023: Dr [name] ANTERIOR CERVICAL CORPECTOMY CERVICAL FOUR, CERVICAL THREE - CERVICAL FIVE FUSION N/A  He has a scheduled appointment with Neurosurgery.  2. Hyperlipidemia: Continue current medication regimen. PCP following.  3. ESRD.: Nephrology Following: He dialyzes three days a week.   F/U with Dr. Lovorn in 4- 6 weeks

## 2023-08-21 DIAGNOSIS — R197 Diarrhea, unspecified: Secondary | ICD-10-CM | POA: Diagnosis not present

## 2023-08-21 DIAGNOSIS — R519 Headache, unspecified: Secondary | ICD-10-CM | POA: Diagnosis not present

## 2023-08-21 DIAGNOSIS — N2581 Secondary hyperparathyroidism of renal origin: Secondary | ICD-10-CM | POA: Diagnosis not present

## 2023-08-21 DIAGNOSIS — D689 Coagulation defect, unspecified: Secondary | ICD-10-CM | POA: Diagnosis not present

## 2023-08-21 DIAGNOSIS — Z992 Dependence on renal dialysis: Secondary | ICD-10-CM | POA: Diagnosis not present

## 2023-08-21 DIAGNOSIS — N186 End stage renal disease: Secondary | ICD-10-CM | POA: Diagnosis not present

## 2023-08-21 DIAGNOSIS — D631 Anemia in chronic kidney disease: Secondary | ICD-10-CM | POA: Diagnosis not present

## 2023-08-23 DIAGNOSIS — R197 Diarrhea, unspecified: Secondary | ICD-10-CM | POA: Diagnosis not present

## 2023-08-23 DIAGNOSIS — N2581 Secondary hyperparathyroidism of renal origin: Secondary | ICD-10-CM | POA: Diagnosis not present

## 2023-08-23 DIAGNOSIS — N186 End stage renal disease: Secondary | ICD-10-CM | POA: Diagnosis not present

## 2023-08-23 DIAGNOSIS — D689 Coagulation defect, unspecified: Secondary | ICD-10-CM | POA: Diagnosis not present

## 2023-08-23 DIAGNOSIS — D631 Anemia in chronic kidney disease: Secondary | ICD-10-CM | POA: Diagnosis not present

## 2023-08-23 DIAGNOSIS — Z992 Dependence on renal dialysis: Secondary | ICD-10-CM | POA: Diagnosis not present

## 2023-08-23 DIAGNOSIS — R519 Headache, unspecified: Secondary | ICD-10-CM | POA: Diagnosis not present

## 2023-08-25 ENCOUNTER — Encounter: Payer: Self-pay | Admitting: Registered Nurse

## 2023-08-26 DIAGNOSIS — D631 Anemia in chronic kidney disease: Secondary | ICD-10-CM | POA: Diagnosis not present

## 2023-08-26 DIAGNOSIS — R197 Diarrhea, unspecified: Secondary | ICD-10-CM | POA: Diagnosis not present

## 2023-08-26 DIAGNOSIS — N186 End stage renal disease: Secondary | ICD-10-CM | POA: Diagnosis not present

## 2023-08-26 DIAGNOSIS — R519 Headache, unspecified: Secondary | ICD-10-CM | POA: Diagnosis not present

## 2023-08-26 DIAGNOSIS — N2581 Secondary hyperparathyroidism of renal origin: Secondary | ICD-10-CM | POA: Diagnosis not present

## 2023-08-26 DIAGNOSIS — Z992 Dependence on renal dialysis: Secondary | ICD-10-CM | POA: Diagnosis not present

## 2023-08-26 DIAGNOSIS — D689 Coagulation defect, unspecified: Secondary | ICD-10-CM | POA: Diagnosis not present

## 2023-08-28 DIAGNOSIS — R519 Headache, unspecified: Secondary | ICD-10-CM | POA: Diagnosis not present

## 2023-08-28 DIAGNOSIS — D689 Coagulation defect, unspecified: Secondary | ICD-10-CM | POA: Diagnosis not present

## 2023-08-28 DIAGNOSIS — Z992 Dependence on renal dialysis: Secondary | ICD-10-CM | POA: Diagnosis not present

## 2023-08-28 DIAGNOSIS — R197 Diarrhea, unspecified: Secondary | ICD-10-CM | POA: Diagnosis not present

## 2023-08-28 DIAGNOSIS — N2581 Secondary hyperparathyroidism of renal origin: Secondary | ICD-10-CM | POA: Diagnosis not present

## 2023-08-28 DIAGNOSIS — N186 End stage renal disease: Secondary | ICD-10-CM | POA: Diagnosis not present

## 2023-08-28 DIAGNOSIS — D631 Anemia in chronic kidney disease: Secondary | ICD-10-CM | POA: Diagnosis not present

## 2023-08-30 DIAGNOSIS — Z992 Dependence on renal dialysis: Secondary | ICD-10-CM | POA: Diagnosis not present

## 2023-08-30 DIAGNOSIS — D631 Anemia in chronic kidney disease: Secondary | ICD-10-CM | POA: Diagnosis not present

## 2023-08-30 DIAGNOSIS — R519 Headache, unspecified: Secondary | ICD-10-CM | POA: Diagnosis not present

## 2023-08-30 DIAGNOSIS — N186 End stage renal disease: Secondary | ICD-10-CM | POA: Diagnosis not present

## 2023-08-30 DIAGNOSIS — D689 Coagulation defect, unspecified: Secondary | ICD-10-CM | POA: Diagnosis not present

## 2023-08-30 DIAGNOSIS — N2581 Secondary hyperparathyroidism of renal origin: Secondary | ICD-10-CM | POA: Diagnosis not present

## 2023-08-30 DIAGNOSIS — R197 Diarrhea, unspecified: Secondary | ICD-10-CM | POA: Diagnosis not present

## 2023-09-02 DIAGNOSIS — R519 Headache, unspecified: Secondary | ICD-10-CM | POA: Diagnosis not present

## 2023-09-02 DIAGNOSIS — D689 Coagulation defect, unspecified: Secondary | ICD-10-CM | POA: Diagnosis not present

## 2023-09-02 DIAGNOSIS — N2581 Secondary hyperparathyroidism of renal origin: Secondary | ICD-10-CM | POA: Diagnosis not present

## 2023-09-02 DIAGNOSIS — R197 Diarrhea, unspecified: Secondary | ICD-10-CM | POA: Diagnosis not present

## 2023-09-02 DIAGNOSIS — D631 Anemia in chronic kidney disease: Secondary | ICD-10-CM | POA: Diagnosis not present

## 2023-09-02 DIAGNOSIS — N186 End stage renal disease: Secondary | ICD-10-CM | POA: Diagnosis not present

## 2023-09-02 DIAGNOSIS — Z992 Dependence on renal dialysis: Secondary | ICD-10-CM | POA: Diagnosis not present

## 2023-09-04 DIAGNOSIS — R197 Diarrhea, unspecified: Secondary | ICD-10-CM | POA: Diagnosis not present

## 2023-09-04 DIAGNOSIS — D631 Anemia in chronic kidney disease: Secondary | ICD-10-CM | POA: Diagnosis not present

## 2023-09-04 DIAGNOSIS — N186 End stage renal disease: Secondary | ICD-10-CM | POA: Diagnosis not present

## 2023-09-04 DIAGNOSIS — Z992 Dependence on renal dialysis: Secondary | ICD-10-CM | POA: Diagnosis not present

## 2023-09-04 DIAGNOSIS — N2581 Secondary hyperparathyroidism of renal origin: Secondary | ICD-10-CM | POA: Diagnosis not present

## 2023-09-04 DIAGNOSIS — R519 Headache, unspecified: Secondary | ICD-10-CM | POA: Diagnosis not present

## 2023-09-04 DIAGNOSIS — D689 Coagulation defect, unspecified: Secondary | ICD-10-CM | POA: Diagnosis not present

## 2023-09-06 DIAGNOSIS — N186 End stage renal disease: Secondary | ICD-10-CM | POA: Diagnosis not present

## 2023-09-06 DIAGNOSIS — D631 Anemia in chronic kidney disease: Secondary | ICD-10-CM | POA: Diagnosis not present

## 2023-09-06 DIAGNOSIS — Z992 Dependence on renal dialysis: Secondary | ICD-10-CM | POA: Diagnosis not present

## 2023-09-06 DIAGNOSIS — R519 Headache, unspecified: Secondary | ICD-10-CM | POA: Diagnosis not present

## 2023-09-06 DIAGNOSIS — D689 Coagulation defect, unspecified: Secondary | ICD-10-CM | POA: Diagnosis not present

## 2023-09-06 DIAGNOSIS — R197 Diarrhea, unspecified: Secondary | ICD-10-CM | POA: Diagnosis not present

## 2023-09-06 DIAGNOSIS — N2581 Secondary hyperparathyroidism of renal origin: Secondary | ICD-10-CM | POA: Diagnosis not present

## 2023-09-09 DIAGNOSIS — D631 Anemia in chronic kidney disease: Secondary | ICD-10-CM | POA: Diagnosis not present

## 2023-09-09 DIAGNOSIS — Z992 Dependence on renal dialysis: Secondary | ICD-10-CM | POA: Diagnosis not present

## 2023-09-09 DIAGNOSIS — R519 Headache, unspecified: Secondary | ICD-10-CM | POA: Diagnosis not present

## 2023-09-09 DIAGNOSIS — D689 Coagulation defect, unspecified: Secondary | ICD-10-CM | POA: Diagnosis not present

## 2023-09-09 DIAGNOSIS — N2581 Secondary hyperparathyroidism of renal origin: Secondary | ICD-10-CM | POA: Diagnosis not present

## 2023-09-09 DIAGNOSIS — N186 End stage renal disease: Secondary | ICD-10-CM | POA: Diagnosis not present

## 2023-09-09 DIAGNOSIS — R197 Diarrhea, unspecified: Secondary | ICD-10-CM | POA: Diagnosis not present

## 2023-09-11 DIAGNOSIS — Z992 Dependence on renal dialysis: Secondary | ICD-10-CM | POA: Diagnosis not present

## 2023-09-11 DIAGNOSIS — N2581 Secondary hyperparathyroidism of renal origin: Secondary | ICD-10-CM | POA: Diagnosis not present

## 2023-09-11 DIAGNOSIS — N186 End stage renal disease: Secondary | ICD-10-CM | POA: Diagnosis not present

## 2023-09-11 DIAGNOSIS — D631 Anemia in chronic kidney disease: Secondary | ICD-10-CM | POA: Diagnosis not present

## 2023-09-11 DIAGNOSIS — R197 Diarrhea, unspecified: Secondary | ICD-10-CM | POA: Diagnosis not present

## 2023-09-11 DIAGNOSIS — R519 Headache, unspecified: Secondary | ICD-10-CM | POA: Diagnosis not present

## 2023-09-11 DIAGNOSIS — D689 Coagulation defect, unspecified: Secondary | ICD-10-CM | POA: Diagnosis not present

## 2023-09-12 ENCOUNTER — Encounter: Admitting: Orthopaedic Surgery

## 2023-09-12 ENCOUNTER — Ambulatory Visit (INDEPENDENT_AMBULATORY_CARE_PROVIDER_SITE_OTHER): Admitting: Orthopedic Surgery

## 2023-09-12 ENCOUNTER — Other Ambulatory Visit: Payer: Self-pay

## 2023-09-12 ENCOUNTER — Encounter: Payer: Self-pay | Admitting: Physical Therapy

## 2023-09-12 ENCOUNTER — Other Ambulatory Visit (INDEPENDENT_AMBULATORY_CARE_PROVIDER_SITE_OTHER): Payer: Self-pay

## 2023-09-12 ENCOUNTER — Ambulatory Visit: Attending: Registered Nurse | Admitting: Physical Therapy

## 2023-09-12 DIAGNOSIS — R296 Repeated falls: Secondary | ICD-10-CM | POA: Insufficient documentation

## 2023-09-12 DIAGNOSIS — G8929 Other chronic pain: Secondary | ICD-10-CM

## 2023-09-12 DIAGNOSIS — R262 Difficulty in walking, not elsewhere classified: Secondary | ICD-10-CM | POA: Diagnosis not present

## 2023-09-12 DIAGNOSIS — M170 Bilateral primary osteoarthritis of knee: Secondary | ICD-10-CM

## 2023-09-12 DIAGNOSIS — S14109S Unspecified injury at unspecified level of cervical spinal cord, sequela: Secondary | ICD-10-CM | POA: Insufficient documentation

## 2023-09-12 DIAGNOSIS — M25511 Pain in right shoulder: Secondary | ICD-10-CM

## 2023-09-12 DIAGNOSIS — I12 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 stage renal disease: Secondary | ICD-10-CM | POA: Diagnosis not present

## 2023-09-12 DIAGNOSIS — R2689 Other abnormalities of gait and mobility: Secondary | ICD-10-CM | POA: Diagnosis not present

## 2023-09-12 DIAGNOSIS — R2681 Unsteadiness on feet: Secondary | ICD-10-CM | POA: Diagnosis not present

## 2023-09-12 DIAGNOSIS — M6281 Muscle weakness (generalized): Secondary | ICD-10-CM | POA: Insufficient documentation

## 2023-09-12 DIAGNOSIS — Z992 Dependence on renal dialysis: Secondary | ICD-10-CM | POA: Diagnosis not present

## 2023-09-12 DIAGNOSIS — N186 End stage renal disease: Secondary | ICD-10-CM | POA: Diagnosis not present

## 2023-09-12 NOTE — Therapy (Signed)
 OUTPATIENT PHYSICAL THERAPY LOWER EXTREMITY EVALUATION   Patient Name: Carl Meester Sr. MRN: [id number] DOB:1960/04/12, 63 y.o., male Today's Date: 09/12/2023  END OF SESSION:  PT End of Session - 09/12/23 1115     Visit Number 1    Number of Visits 25    Date for PT Re-Evaluation 12/05/23    Authorization Type [employer] Dual    Authorization Time Period 09/12/23 to 12/05/23    PT Start Time 1029   late pt arrival   PT Stop Time 1056    PT Time Calculation (min) 27 min    Activity Tolerance Patient tolerated treatment well    Behavior During Therapy [facility] for tasks assessed/performed          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    Parathyroid  abnormality (HCC)    ectopic parathyroid  gland   Presence of arteriovenous fistula for hemodialysis, primary (HCC)    RUE PER PT RLE   Refusal of blood product    NO WHOLE BLOOD PROUCTS   Renal cell carcinoma (HCC)    s/p hand assisted laparoscopic bilateral nephrectomies 11/29/17, + RCC left   Secondary hyperparathyroidism (HCC)    Seizures (HCC)    one episode in past, due to elevated Potassium 08/02/20- at least 4 years ago   Sleep apnea    doesn't use CPAP anymore since weight loss   Stroke [facility])    no residual   Past Surgical History:  Procedure Laterality Date   ANTERIOR CERVICAL CORPECTOMY N/A 05/25/2023   Procedure: ANTERIOR CERVICAL CORPECTOMY CERVICAL FOUR, CERVICAL THREE - CERVICAL FIVE  FUSION;  Surgeon: [name] Shove, MD;  Location: [facility] OR;  Service: Neurosurgery;  Laterality: N/A;   AV FISTULA PLACEMENT Right    right arm   BIOPSY  08/01/2019   Procedure: BIOPSY;  Surgeon: Kristie Lamprey, MD;  Location: [facility] ENDOSCOPY;  Service: Endoscopy;;   BIOPSY  11/18/2020   Procedure: BIOPSY;  Surgeon: Stacia Glendia BRAVO, MD;  Location: [facility] ENDOSCOPY;  Service: Gastroenterology;;   BIOPSY  09/13/2021   Procedure: BIOPSY;  Surgeon: Wilhelmenia Aloha Raddle., MD;  Location: [facility] ENDOSCOPY;  Service: Gastroenterology;;   CATARACT EXTRACTION W/ INTRAOCULAR LENS  IMPLANT, BILATERAL     COLON SURGERY     COLONOSCOPY N/A 08/04/2015   Procedure: COLONOSCOPY;  Surgeon: Gordy CHRISTELLA Starch, MD;  Location: [facility] ENDOSCOPY;  Service: Endoscopy;  Laterality: N/A;   COLONOSCOPY  2017   JMP@ [facility]-good prep-mass -recall 1 yr   COLONOSCOPY N/A 09/13/2021   Procedure: COLONOSCOPY;  Surgeon: Mansouraty, Aloha Raddle., MD;  Location: [facility] ENDOSCOPY;  Service: Gastroenterology;  Laterality: N/A;   COLONOSCOPY WITH PROPOFOL  N/A 11/25/2020   Procedure: COLONOSCOPY WITH PROPOFOL ;  Surgeon: Federico Rosario BROCKS, MD;  Location: [facility] ENDOSCOPY;  Service: Gastroenterology;  Laterality: N/A;  COLONOSCOPY WITH PROPOFOL  N/A 09/23/2021   Procedure: COLONOSCOPY WITH PROPOFOL ;  Surgeon: San Sandor GAILS, DO;  Location: [facility] ENDOSCOPY;  Service: Gastroenterology;  Laterality: N/A;   ENTEROSCOPY N/A 09/23/2021   Procedure: ENTEROSCOPY;  Surgeon: San Sandor GAILS, DO;  Location: [facility] ENDOSCOPY;  Service: Gastroenterology;  Laterality: N/A;  Will place order for video capsule study as we may opt to place it during procedure   ESOPHAGOGASTRODUODENOSCOPY N/A 08/01/2019   Procedure: ESOPHAGOGASTRODUODENOSCOPY (EGD);  Surgeon: Kristie Lamprey, MD;  Location: [facility] ENDOSCOPY;  Service: Endoscopy;  Laterality: N/A;   ESOPHAGOGASTRODUODENOSCOPY N/A 11/18/2020   Procedure: ESOPHAGOGASTRODUODENOSCOPY (EGD);  Surgeon: Stacia Glendia BRAVO, MD;  Location: [facility] ENDOSCOPY;   Service: Gastroenterology;  Laterality: N/A;   ESOPHAGOGASTRODUODENOSCOPY (EGD) WITH PROPOFOL  N/A 08/04/2019   Procedure: ESOPHAGOGASTRODUODENOSCOPY (EGD) WITH PROPOFOL ;  Surgeon: Leigh Elspeth SQUIBB, MD;  Location: [facility] ENDOSCOPY;  Service: Gastroenterology;  Laterality: N/A;   ESOPHAGOGASTRODUODENOSCOPY (EGD) WITH PROPOFOL  N/A 09/13/2021   Procedure: ESOPHAGOGASTRODUODENOSCOPY (EGD) WITH PROPOFOL ;  Surgeon: Wilhelmenia Aloha Raddle., MD;  Location: [facility] ENDOSCOPY;  Service: Gastroenterology;  Laterality: N/A;   GIVENS CAPSULE STUDY N/A 09/23/2021   Procedure: GIVENS CAPSULE STUDY;  Surgeon: San Sandor GAILS, DO;  Location: [facility] ENDOSCOPY;  Service: Gastroenterology;  Laterality: N/A;   graft left arm Left    for dialysis x 2. Removed   HOT HEMOSTASIS  11/18/2020   Procedure: HOT HEMOSTASIS (ARGON PLASMA COAGULATION/BICAP);  Surgeon: Stacia Glendia BRAVO, MD;  Location: [facility] ENDOSCOPY;  Service: Gastroenterology;;   HOT HEMOSTASIS N/A 09/23/2021   Procedure: HOT HEMOSTASIS (ARGON PLASMA COAGULATION/BICAP);  Surgeon: San Sandor GAILS, DO;  Location: [facility] ENDOSCOPY;  Service: Gastroenterology;  Laterality: N/A;   INSERTION OF DIALYSIS CATHETER     Rt chest   LAPAROSCOPIC RIGHT COLECTOMY N/A 08/05/2015   Procedure: LAPAROSCOPIC RIGHT COLECTOMY- ASCENDING;  Surgeon: Jina Nephew, MD;  Location: [facility] OR;  Service: General;  Laterality: N/A;   MASS EXCISION Left 05/28/2019   Procedure: EXCISION SOFT TISSUE MASS LEFT SHOULDER;  Surgeon: Eletha Boas, MD;  Location: [facility] ORS;  Service: General;  Laterality: Left;   NEPHRECTOMY Bilateral    PARATHYROIDECTOMY N/A 06/12/2016   Procedure: TOTAL PARATHYROIDECTOMY WITH AUTOTRANSPLANTATION TO LEFT FOREARM;  Surgeon: Boas Eletha, MD;  Location: [facility] OR;  Service: General;  Laterality: N/A;   PARATHYROIDECTOMY N/A 10/23/2016   Procedure: PARATHYROIDECTOMY;  Surgeon: Eletha Boas, MD;  Location: [facility] OR;  Service: General;  Laterality: N/A;   REVERSE SHOULDER ARTHROPLASTY Right  08/24/2020   Procedure: REVERSE SHOULDER ARTHROPLASTY;  Surgeon: Cristy Bonner DASEN, MD;  Location[facility] OR;  Service: Orthopedics;  Laterality: Right;   REVISON OF ARTERIOVENOUS FISTULA Right 07/16/2017   Procedure: REVISION OF ARTERIOVENOUS FISTULA  Right ARM;  Surgeon: Sheree Penne Bruckner, MD;  Location: [facility] OR;  Service: Vascular;  Laterality: Right;   TOTAL HIP ARTHROPLASTY Left 11/14/2020   Procedure: LEFT TOTAL HIP ARTHROPLASTY ANTERIOR APPROACH;  Surgeon: [name] Kay HERO, MD;  Location: [facility] OR;  Service: Orthopedics;  Laterality: Left;   UPPER GASTROINTESTINAL ENDOSCOPY  2021   @ [facility]   Patient Active Problem List   Diagnosis Date Noted   Right hip pain 07/14/2023   Coping style affecting medical condition 06/05/2023   Spinal cord injury, cervical region, sequela (HCC) 05/30/2023   Cervical vertebral fracture (HCC) 05/25/2023   History of GI bleed 05/25/2023   C4 cervical fracture (HCC) 05/25/2023   Duodenitis    Gastritis and gastroduodenitis    Ischemic colitis (HCC)    Bright red blood per rectum  ABLA (acute blood loss anemia)    Gastric ulcer    Duodenal ulcer    Diverticulosis of colon without hemorrhage    GI bleed 09/17/2021   Acute upper GI bleeding 09/11/2021   Mucosal abnormality of esophagus    Mucosal abnormality of stomach    Mucosal abnormality of duodenum    Upper GI bleed    Left hip pain 11/16/2020   Type 2 diabetes mellitus with other specified complication (HCC) 11/14/2020   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/14/2020   Primary osteoarthritis of left hip 10/07/2020   Status post reverse total arthroplasty of right shoulder 08/24/2020   Diverticulitis 03/08/2020   ESRD (end stage renal disease) (HCC) 03/08/2020   Polyneuropathy in diseases classified elsewhere (HCC) 03/08/2020   Pure hypercholesterolemia 03/08/2020   Sacral back pain 03/08/2020   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 11/10/2019   Acute gastric ulcer with hemorrhage    Acute GI  bleeding 07/31/2019   Hypotension of hemodialysis 07/31/2019   Diverticulitis large intestine 07/31/2019   Mass of soft tissue of shoulder 05/26/2019   Awaiting organ transplant status 03/18/2019   Finding of cocaine in blood 02/25/2019   Anaphylactic shock, unspecified, sequela 11/21/2018   Hypercalcemia 10/28/2018   Diarrhea, unspecified 08/08/2018   Hyperkalemia 03/12/2018   Renal cell carcinoma of left kidney (HCC) 12/24/2017   Fluid overload, unspecified 07/06/2017   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   Other specified disorders of kidney and ureter 11/22/2016   Secondary hyperparathyroidism of renal origin (HCC) 10/23/2016   Headache disorder 08/21/2016   TIA (transient ischemic attack)    Essential hypertension    Seizure (HCC)    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    Lung nodule    Diverticulitis of cecum 07/21/2015   Diverticulitis of large intestine without perforation or abscess without bleeding    Right lower quadrant pain    Obstructive sleep apnea 02/08/2015   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   Diverticulosis 07/13/2014   Lower GI bleed 07/12/2014   Refusal of blood transfusions as patient is Jehovah's Witness    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   HLD (hyperlipidemia)    PRES (posterior reversible encephalopathy syndrome)    History of CVA (cerebrovascular accident) 05/23/2014   Anemia of chronic disease 05/23/2014   GERD (gastroesophageal reflux disease) 12/09/2012   Gastrointestinal bleeding 11/12/2012   Melena 11/12/2012   Other psychoactive substance dependence, uncomplicated (HCC) 10/08/2012   Iron deficiency anemia, unspecified 06/10/2012   Moderate protein-calorie malnutrition (HCC) 07/29/2009   Coagulation defect, unspecified (HCC) 08/29/2006   Type 2 diabetes mellitus with diabetic peripheral angiopathy without gangrene (HCC) 08/29/2006   Hypertensive  chronic kidney disease with stage 5 chronic kidney disease or end stage renal disease (HCC) 08/02/2006    PCP: [name] MD   REFERRING PROVIDER: Debby Fidela CROME, NP  REFERRING DIAG:  Diagnosis  S14.109S (ICD-10-CM) - Spinal cord injury, cervical region, sequela (HCC)    THERAPY DIAG:  Muscle weakness (generalized)  Difficulty in walking, not elsewhere classified  Other abnormalities of gait and mobility  Unsteadiness on feet  Repeated falls  Rationale for Evaluation and Treatment: Rehabilitation  ONSET DATE:  ACDF 05/25/23  SUBJECTIVE:   SUBJECTIVE STATEMENT:  Had surgery in April after a fall- the doctors said that my cervical cord was narrowing anyway, they discovered the extent after I came in to be seen after the fall. Ended up in  rehab in [place] for 30 days. Went from [facility] to walker, then went to [place] for more rehab after I DCed from [employer]. Can't get in my convertible no more, its too short. R side is still weaker than the L. Have had a couple of falls since getting out of the hospital.   PERTINENT HISTORY: See above   ACDF C3-C5 fusion, anterior cervical corpectomy C3-C4  PAIN:  Are you having pain? Pain is off and on, R hip may need surgery in about 6 months, knees are bone on bone, no number/10 given   PRECAUTIONS:  Precautions:  Precautions Precautions: Fall, Cervical, Other (comment) Recall of Precautions/Restrictions: Impaired Precaution/Restrictions Comments: Verbally reviewed cervical precautions. Required Braces or Orthoses: Cervical Brace Cervical Brace: Soft collar, At all times Restrictions Weight Bearing Restrictions Per Provider Order: No General: Pain: No c/o pain.   RED FLAGS: None   WEIGHT BEARING RESTRICTIONS: No  FALLS:  Has patient fallen in last 6 months? Yes. Number of falls 3- one that led to surgery, then 2 after getting home from the hospital  LIVING ENVIRONMENT: Lives with: lives with their son Lives in:  House/apartment Stairs: ground floor apt  Has following equipment at home: Single point cane, Environmental consultant - 2 wheeled, and Environmental consultant - 4 wheeled  OCCUPATION: retired- used to pressure wash   PLOF: Independent, Independent with basic ADLs, Independent with gait, and Independent with transfers  PATIENT GOALS: get back to normal ability, drive convertible again   NEXT MD VISIT: Dr. Cornelio 11/15/23  OBJECTIVE:  Note: Objective measures were completed at Evaluation unless otherwise noted.  DIAGNOSTIC FINDINGS:    CLINICAL DATA:  63 year old male with altered mental status.   EXAM: CT HEAD WITHOUT CONTRAST   TECHNIQUE: Contiguous axial images were obtained from the base of the skull through the vertex without intravenous contrast.   RADIATION DOSE REDUCTION: This exam was performed according to the departmental dose-optimization program which includes automated exposure control, adjustment of the mA and/or kV according to patient size and/or use of iterative reconstruction technique.   COMPARISON:  Brain MRI and head CT 05/25/2023.   FINDINGS: Brain:   Unusual extensive, diffuse pachymeningeal calcification which has been present since at least 2013. Stable cerebral volume. No midline shift, ventriculomegaly, mass effect, evidence of mass lesion, intracranial hemorrhage or evidence of cortically based acute infarction. Small area of chronic encephalomalacia in the medial left occipital lobe redemonstrated. Stable gray-white matter differentiation throughout the brain.   Vascular: Chronic severe pachymeningeal calcification.   Skull: Appears stable.  No acute osseous abnormality identified.   Sinuses/Orbits: Continued left middle ear and mastoid opacification. Other Visualized paranasal sinuses and mastoids are stable and well aerated.   Other: Anterior left scalp soft tissue injury has regressed since earlier in the month. Orbits soft tissues appears stable.    IMPRESSION: 1. No acute intracranial abnormality. 2. 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 3. Ongoing left middle ear and mastoid opacification. No acute osseous abnormality identified. 4. Regressed scalp soft tissue injury since 05/25/2023.  CLINICAL DATA:  Chronic bilateral knee pain and swelling.   EXAM: RIGHT KNEE - COMPLETE 4+ VIEW; LEFT KNEE - COMPLETE 4+ VIEW   COMPARISON:  Right knee radiographs dated 12/26/2021.   FINDINGS: Right knee:   No acute fracture or dislocation. There is a small to moderate-sized joint effusion. Moderate tricompartmental degenerative changes with joint space narrowing and marginal osteophytosis. Vascular calcifications are noted.   Left knee:  No acute fracture or dislocation. There is a small joint effusion. Moderate tricompartmental degenerative changes with joint space narrowing and marginal osteophytosis. Vascular calcifications are noted.   IMPRESSION: 1. No acute osseous abnormality. 2. 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  Narrative & Impression  CLINICAL DATA:  Chronic bilateral knee pain and swelling.   EXAM: RIGHT KNEE - COMPLETE 4+ VIEW; LEFT KNEE - COMPLETE 4+ VIEW   COMPARISON:  Right knee radiographs dated 12/26/2021.   FINDINGS: Right knee:   No acute fracture or dislocation. There is a small to moderate-sized joint effusion. Moderate tricompartmental degenerative changes with joint space narrowing and marginal osteophytosis. Vascular calcifications are noted.   Left knee:   No acute fracture or dislocation. There is a small joint effusion. Moderate tricompartmental degenerative changes with joint space narrowing and marginal osteophytosis. Vascular calcifications are noted.   IMPRESSION: 1. No acute osseous abnormality. 2. Moderate tricompartmental  degenerative changes of the bilateral knees with small to moderate-sized knee joint effusions.    Narrative & Impression CLINICAL DATA:  Right hip pain.   EXAM: DG HIP (WITH OR WITHOUT PELVIS) 2-3V RIGHT   COMPARISON:  None Available.   FINDINGS: No acute fracture or dislocation. The bones are osteopenic. Cystic changes of the right femoral head and neck measure up to 3.7 cm. Orthopedic referral is advised. Mild arthritic changes of the right hip. Total left hip arthroplasty. The soft tissues are unremarkable. Vascular calcifications.   IMPRESSION: 1. No acute fracture or dislocation. 2. Cystic changes of the right femoral head and neck. Orthopedic referral is advised.  Lower Venous DVT Study   Patient Name:  Carl Grochowski Sr.  Date of Exam:   06/10/2023  Medical Rec #: [id number]            Accession #:    [id number]  Date of Birth: [birth year]             Patient Gender: M  Patient Age:   6 years  Exam [facility]  [facility]  Procedure:      VAS US  LOWER EXTREMITY VENOUS (DVT)  Referring Phys: PRENTICE COMPTON    ---------------------------------------------------------------------------  -----    Indications: Edema.    Risk Factors: Surgery Cervical corpectomy 05/26/23.  Comparison Study: No significant changes seen since previous exam 07/04/06.   Performing Technologist: Garnette Rockers     Examination Guidelines:  A complete evaluation includes B-mode imaging, spectral Doppler, color  Doppler,  and power Doppler as needed of all accessible portions of each vessel.  Bilateral  testing is considered an integral part of a complete examination. Limited  examinations for reoccurring indications may be performed as noted. The  reflux  portion of the exam is performed with the patient in reverse  Trendelenburg.      +---------+---------------+---------+-----------+----------+--------------+   RIGHT    CompressibilityPhasicitySpontaneityPropertiesThrombus  Aging  +---------+---------------+---------+-----------+----------+--------------+   CFV     Full           Yes      Yes                                   +---------+---------------+---------+-----------+----------+--------------+   SFJ     Full                                                          +---------+---------------+---------+-----------+----------+--------------+  FV Prox  Full                                                          +---------+---------------+---------+-----------+----------+--------------+   FV Mid   Full                                                          +---------+---------------+---------+-----------+----------+--------------+   FV DistalFull                                                          +---------+---------------+---------+-----------+----------+--------------+   PFV     Full                                                          +---------+---------------+---------+-----------+----------+--------------+   POP     Full           Yes      Yes                                   +---------+---------------+---------+-----------+----------+--------------+   PTV     Full                    Yes                                   +---------+---------------+---------+-----------+----------+--------------+   PERO    Full                    Yes                                   +---------+---------------+---------+-----------+----------+--------------+          +---------+---------------+---------+-----------+----------+--------------+   LEFT    CompressibilityPhasicitySpontaneityPropertiesThrombus  Aging  +---------+---------------+---------+-----------+----------+--------------+   CFV     Full           Yes      Yes                                    +---------+---------------+---------+-----------+----------+--------------+   SFJ     Full                                                          +---------+---------------+---------+-----------+----------+--------------+   FV Prox  Full                                                          +---------+---------------+---------+-----------+----------+--------------+  FV Mid   Full                                                          +---------+---------------+---------+-----------+----------+--------------+   FV DistalFull                                                          +---------+---------------+---------+-----------+----------+--------------+   PFV     Full                                                          +---------+---------------+---------+-----------+----------+--------------+   POP     Full           Yes      Yes                                   +---------+---------------+---------+-----------+----------+--------------+   PTV     Full                    Yes                                   +---------+---------------+---------+-----------+----------+--------------+   PERO    Full                    Yes                                   +---------+---------------+---------+-----------+----------+--------------+               Summary:  BILATERAL:  - No evidence of deep vein thrombosis seen in the lower extremities,  bilaterally.  -No evidence of popliteal cyst, bilaterally.    PATIENT SURVEYS:  PSFS: THE PATIENT SPECIFIC FUNCTIONAL SCALE  Place score of 0-10 (0 = unable to perform activity and 10 = able to perform activity at the same level as before injury or problem)  Activity Date: eval 09/12/23    Getting in and out of car (anything lower than 20 inches)  0    2. Balance  3    3. Walking without a device  2    4.  Social activities  0    Total Score 1.25      Total  Score = Sum of activity scores/number of activities  Minimally Detectable Change: 3 points (for single activity); 2 points (for average score)  [employer] (nd). The Patient Specific Functional Scale . Retrieved from [email or web address]   COGNITION: Overall cognitive status: Within functional limits for tasks assessed     SENSATION: Hx of neuropathy even before fall/surgery     LOWER EXTREMITY MMT:  MMT Right eval Left eval  Hip flexion 3- 3+  Hip extension    Hip abduction  Hip adduction    Hip internal rotation    Hip external rotation    Knee flexion 3- 4+  Knee extension 3- 5  Ankle dorsiflexion 4 5  Ankle plantarflexion    Ankle inversion    Ankle eversion     (Blank rows = not tested)    GAIT: Distance walked: in clinic distances  Assistive device utilized: Environmental consultant - 2 wheeled Level of assistance: Modified independence Comments: slow but steady with RW                                                                                                                                TREATMENT DATE:   09/12/23  Eval, POC      PATIENT EDUCATION:  Education details: exam findings, POC Person educated: Patient Education method: Explanation Education comprehension: verbalized understanding, returned demonstration, and needs further education  HOME EXERCISE PROGRAM: TBD   ASSESSMENT:  CLINICAL IMPRESSION: Patient is a 63 y.o. M who was seen today for physical therapy evaluation and treatment for  Diagnosis  S14.109S (ICD-10-CM) - Spinal cord injury, cervical region, sequela (HCC)  . Late for eval and pleasant but very talkative-  and as such my assessment was quite limited. Will make every effort to improve function and work towards goals.   OBJECTIVE IMPAIRMENTS: Abnormal gait, decreased activity tolerance, decreased balance, decreased coordination, decreased knowledge of use of DME,  decreased mobility, difficulty walking, decreased strength, postural dysfunction, and pain.   ACTIVITY LIMITATIONS: standing, squatting, stairs, transfers, bed mobility, toileting, dressing, and locomotion level  PARTICIPATION LIMITATIONS: shopping, community activity, and yard work  PERSONAL FACTORS: Age, Behavior pattern, Education, Fitness, Past/current experiences, Profession, Social background, and Time since onset of injury/illness/exacerbation are also affecting patient's functional outcome.   REHAB POTENTIAL: Good  CLINICAL DECISION MAKING: Evolving/moderate complexity  EVALUATION COMPLEXITY: Moderate   GOALS: Goals reviewed with patient? No  SHORT TERM GOALS: Target date: 10/24/2023   Will be compliant with appropriate progressive HEP  Baseline: Goal status: INITIAL  2.  Will be able to consistently perform functional STS tranfers from surfaces 20 inches or less  Baseline:  Goal status: INITIAL  3.  Will be able to ambulate at least 310ft with quad cane no more than light MinA  Baseline:  Goal status: INITIAL  4.  MMT to be at least 3+/5 in all tested groups R LE  Baseline:  Goal status: INITIAL    LONG TERM GOALS: Target date: 12/05/2023      R LE MMT to be at least 4-/5 in all tested groups  Baseline:  Goal status: INITIAL  2.  Will score at least 18 on DGI with no device to show reduced fall risk  Baseline:  Goal status: INITIAL  3.  Will be able to reciprocally ascend/descend steps without difficulty and U rail  Baseline:  Goal status: INITIAL  4.  Will be able to ambulate community distances with LRAD, Mod(I) to  allow for improved community access and participating in social activities  Baseline:  Goal status: INITIAL  5.  Will be compliant with appropriate gym based exercise program by time of DC  Baseline:  Goal status: INITIAL    PLAN:  PT FREQUENCY: 2x/week  PT DURATION: 12 weeks  PLANNED INTERVENTIONS: 97750- Physical  Performance Testing, 97110-Therapeutic exercises, 97530- Therapeutic activity, 97112- Neuromuscular re-education, 97535- Self Care, 02859- Manual therapy, and 97116- Gait training  PLAN FOR NEXT SESSION: still needs HEP, generally needs balance, strength, conditioning, safety awareness training  Josette Rough, PT, DPT 09/12/23 11:16 AM

## 2023-09-13 ENCOUNTER — Encounter: Payer: Self-pay | Admitting: Orthopedic Surgery

## 2023-09-13 DIAGNOSIS — D631 Anemia in chronic kidney disease: Secondary | ICD-10-CM | POA: Diagnosis not present

## 2023-09-13 DIAGNOSIS — R197 Diarrhea, unspecified: Secondary | ICD-10-CM | POA: Diagnosis not present

## 2023-09-13 DIAGNOSIS — N2581 Secondary hyperparathyroidism of renal origin: Secondary | ICD-10-CM | POA: Diagnosis not present

## 2023-09-13 DIAGNOSIS — D509 Iron deficiency anemia, unspecified: Secondary | ICD-10-CM | POA: Diagnosis not present

## 2023-09-13 DIAGNOSIS — D689 Coagulation defect, unspecified: Secondary | ICD-10-CM | POA: Diagnosis not present

## 2023-09-13 DIAGNOSIS — Z992 Dependence on renal dialysis: Secondary | ICD-10-CM | POA: Diagnosis not present

## 2023-09-13 MED ORDER — LIDOCAINE HCL 1 % IJ SOLN
5.0000 mL | INTRAMUSCULAR | Status: AC | PRN
  Administered 2023-09-12: 5 mL

## 2023-09-13 MED ORDER — BUPIVACAINE HCL 0.25 % IJ SOLN
5.0000 mL | INTRAMUSCULAR | Status: AC | PRN
Start: 2023-09-12 — End: 2023-09-12
  Administered 2023-09-12: 5 mL via INTRA_ARTICULAR

## 2023-09-13 MED ORDER — LIDOCAINE HCL 1 % IJ SOLN
5.0000 mL | INTRAMUSCULAR | Status: AC | PRN
Start: 2023-09-12 — End: 2023-09-12
  Administered 2023-09-12: 5 mL

## 2023-09-13 MED ORDER — BUPIVACAINE HCL 0.25 % IJ SOLN
5.0000 mL | INTRAMUSCULAR | Status: AC | PRN
  Administered 2023-09-12: 5 mL via INTRA_ARTICULAR

## 2023-09-13 NOTE — Progress Notes (Signed)
 Office Visit Note   Patient: Carl Sperl Sr.           Date of Birth: 1960-09-07           MRN: [id number] Visit Date: 09/12/2023 Requested by: [name], MD [address] [place],  [place] [address] PCP: [name], MD  Subjective: Chief Complaint  Patient presents with   Right Shoulder - Pain    HPI: Carl Hayduk Sr. is a 63 y.o. male who presents to the office reporting right shoulder and bilateral knee pain.  Had previous shoulder arthroplasty in 2022.  Since he was last seen he sustained a fall with significant cervical spine injury.  He is recovering from that cervical spine fusion done in March of this year.  He was previously scheduled for shoulder revision surgery in March but that was canceled because of his cervical spine surgery.  He is currently using a walker all the time.  He does describe primarily right sided weakness of the shoulder and arm.  He is improving.  States his knees are bothering him more than his shoulder.  He does weight-bear to some degree through that arm since his neck surgery..                ROS: All systems reviewed are negative as they relate to the chief complaint within the history of present illness.  Patient denies fevers or chills.  Assessment & Plan: Visit Diagnoses:  1. Chronic right shoulder pain     Plan: Impression is right shoulder failed reverse shoulder replacement.  No clear-cut evidence of infection but I think the migration is likely due to a mismatch between his humeral shaft size and the implant size as well as possible poor bone quality from his chronic disease.  His knees are bothering him now more.  Both knees have an effusion.  Bilateral knee aspiration and injection today is performed.  We injected 1/2 cc of Toradol  +4 cc of Marcaine  into each knee as did not delay his knee replacement.  He will be seeing Dr. [name] in early August to get that knee scheduled.  Once he has recovered from that we can  tackle the shoulder.  Follow-Up Instructions: No follow-ups on file.   Orders:  Orders Placed This Encounter  Procedures   XR Shoulder Right   No orders of the defined types were placed in this encounter.     Procedures: Large Joint Inj: bilateral knee on 09/12/2023 6:46 PM Indications: diagnostic evaluation, joint swelling and pain Details: 18 G 1.5 in needle, superolateral approach  Arthrogram: No  Medications (Right): 5 mL lidocaine  1 %; 5 mL bupivacaine  0.25 % Medications (Left): 5 mL lidocaine  1 %; 5 mL bupivacaine  0.25 % Outcome: tolerated well, no immediate complications Procedure, treatment alternatives, risks and benefits explained, specific risks discussed. Consent was given by the patient. Immediately prior to procedure a time out was called to verify the correct patient, procedure, equipment, support staff and site/side marked as required. Patient was prepped and draped in the usual sterile fashion.     1/2 cc Toradol  injected into each knee as well  Clinical Data: No additional findings.  Objective: Vital Signs: There were no vitals taken for this visit.  Physical Exam:  Constitutional: Patient appears well-developed HEENT:  Head: Normocephalic Eyes:EOM are normal Neck: Normal range of motion Cardiovascular: Normal rate Pulmonary/chest: Effort normal Neurologic: Patient is alert Skin: Skin is warm Psychiatric: Patient has normal mood and affect  Ortho Exam: Ortho exam demonstrates bilateral knee range of motion of 3-1 15 with moderate effusion present.  No groin pain with internal/external rotation of the leg.  Right shoulder has functional deltoid with pretty reasonable internal and external rotation strength.  Motor or sensory function of the hand is intact.  Does have a fistula in the distal right arm.  No lymphadenopathy warmth or swelling in that right shoulder girdle region  Specialty Comments:  No specialty comments available.  Imaging: No  results found.   PMFS History: Patient Active Problem List   Diagnosis Date Noted   Right hip pain 07/14/2023   Coping style affecting medical condition 06/05/2023   Spinal cord injury, cervical region, sequela (HCC) 05/30/2023   Cervical vertebral fracture (HCC) 05/25/2023   History of GI bleed 05/25/2023   C4 cervical fracture (HCC) 05/25/2023   Duodenitis    Gastritis and gastroduodenitis    Ischemic colitis (HCC)    Bright red blood per rectum    ABLA (acute blood loss anemia)    Gastric ulcer    Duodenal ulcer    Diverticulosis of colon without hemorrhage    GI bleed 09/17/2021   Acute upper GI bleeding 09/11/2021   Mucosal abnormality of esophagus    Mucosal abnormality of stomach    Mucosal abnormality of duodenum    Upper GI bleed    Left hip pain 11/16/2020   Type 2 diabetes mellitus with other specified complication (HCC) 11/14/2020   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/14/2020   Primary osteoarthritis of left hip 10/07/2020   Status post reverse total arthroplasty of right shoulder 08/24/2020   Diverticulitis 03/08/2020   ESRD (end stage renal disease) (HCC) 03/08/2020   Polyneuropathy in diseases classified elsewhere (HCC) 03/08/2020   Pure hypercholesterolemia 03/08/2020   Sacral back pain 03/08/2020   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 11/10/2019   Acute gastric ulcer with hemorrhage    Acute GI bleeding 07/31/2019   Hypotension of hemodialysis 07/31/2019   Diverticulitis large intestine 07/31/2019   Mass of soft tissue of shoulder 05/26/2019   Awaiting organ transplant status 03/18/2019   Finding of cocaine in blood 02/25/2019   Anaphylactic shock, unspecified, sequela 11/21/2018   Hypercalcemia 10/28/2018   Diarrhea, unspecified 08/08/2018   Hyperkalemia 03/12/2018   Renal cell carcinoma of left kidney (HCC) 12/24/2017   Fluid overload, unspecified 07/06/2017   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   Other specified disorders  of kidney and ureter 11/22/2016   Secondary hyperparathyroidism of renal origin (HCC) 10/23/2016   Headache disorder 08/21/2016   TIA (transient ischemic attack)    Essential hypertension    Seizure (HCC)    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    Lung nodule    Diverticulitis of cecum 07/21/2015   Diverticulitis of large intestine without perforation or abscess without bleeding    Right lower quadrant pain    Obstructive sleep apnea 02/08/2015   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   Diverticulosis 07/13/2014   Lower GI bleed 07/12/2014   Refusal of blood transfusions as patient is Jehovah's Witness    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   HLD (hyperlipidemia)    PRES (posterior reversible encephalopathy syndrome)    History of CVA (cerebrovascular accident) 05/23/2014   Anemia of chronic disease 05/23/2014   GERD (gastroesophageal reflux disease) 12/09/2012   Gastrointestinal bleeding 11/12/2012   Melena 11/12/2012   Other psychoactive substance dependence, uncomplicated (HCC)  10/08/2012   Iron deficiency anemia, unspecified 06/10/2012   Moderate protein-calorie malnutrition (HCC) 07/29/2009   Coagulation defect, unspecified (HCC) 08/29/2006   Type 2 diabetes mellitus with diabetic peripheral angiopathy without gangrene (HCC) 08/29/2006   Hypertensive chronic kidney disease with stage 5 chronic kidney disease or end stage renal disease (HCC) 08/02/2006   Past Medical History:  Diagnosis Date   Acute gastric ulcer with hemorrhage    Acute GI bleeding 07/31/2019   Acute pericarditis    Anemia    hx of   Anxiety    situational    Arthritis    on meds   Borderline diabetes    Cataract    bilateral sx   Chronic headaches    Depression    situational    Diverticulitis    ESRD (end stage renal disease) (HCC)     On Renal Transplant List, [facility]; MWF (10/23/2016)   GERD (gastroesophageal reflux  disease)    with certain foods   GI bleed    Hypertension    diet controlled   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    Parathyroid  abnormality (HCC)    ectopic parathyroid  gland   Presence of arteriovenous fistula for hemodialysis, primary (HCC)    RUE PER PT RLE   Refusal of blood product    NO WHOLE BLOOD PROUCTS   Renal cell carcinoma (HCC)    s/p hand assisted laparoscopic bilateral nephrectomies 11/29/17, + RCC left   Secondary hyperparathyroidism (HCC)    Seizures (HCC)    one episode in past, due to elevated Potassium 08/02/20- at least 4 years ago   Sleep apnea    doesn't use CPAP anymore since weight loss   Stroke (HCC)    no residual    Family History  Problem Relation Age of Onset   Diabetes Father    Stroke Father    Hypertension Father    Uterine cancer Mother    Lupus Sister    Stroke Sister    Hypertension Sister    Anuerysm Brother        brain   Colon cancer Neg Hx    Esophageal cancer Neg Hx    Stomach cancer Neg Hx    Pancreatic cancer Neg Hx    Liver disease Neg Hx    Colon polyps Neg Hx    Rectal cancer Neg Hx     Past Surgical History:  Procedure Laterality Date   ANTERIOR CERVICAL CORPECTOMY N/A 05/25/2023   Procedure: ANTERIOR CERVICAL CORPECTOMY CERVICAL FOUR, CERVICAL THREE - CERVICAL FIVE FUSION;  Surgeon: [name] Shove, MD;  Location: [facility] OR;  Service: Neurosurgery;  Laterality: N/A;   AV FISTULA PLACEMENT Right    right arm   BIOPSY  08/01/2019   Procedure: BIOPSY;  Surgeon: Kristie Lamprey, MD;  Location: [facility] ENDOSCOPY;  Service: Endoscopy;;   BIOPSY  11/18/2020   Procedure: BIOPSY;  Surgeon: Stacia Glendia BRAVO, MD;  Location: [facility] ENDOSCOPY;  Service: Gastroenterology;;   BIOPSY  09/13/2021   Procedure: BIOPSY;  Surgeon: Wilhelmenia Aloha Raddle., MD;  Location: [facility] ENDOSCOPY;  Service: Gastroenterology;;   CATARACT EXTRACTION W/ INTRAOCULAR LENS  IMPLANT, BILATERAL     COLON SURGERY     COLONOSCOPY N/A 08/04/2015   Procedure: COLONOSCOPY;   Surgeon: Gordy CHRISTELLA Starch, MD;  Location: [facility] ENDOSCOPY;  Service: Endoscopy;  Laterality: N/A;   COLONOSCOPY  2017   JMP@ [facility]-good prep-mass -recall 1 yr   COLONOSCOPY N/A 09/13/2021   Procedure: COLONOSCOPY;  Surgeon: Wilhelmenia Aloha Raddle.,  MD;  Location: [facility] ENDOSCOPY;  Service: Gastroenterology;  Laterality: N/A;   COLONOSCOPY WITH PROPOFOL  N/A 11/25/2020   Procedure: COLONOSCOPY WITH PROPOFOL ;  Surgeon: Federico Rosario BROCKS, MD;  Location: [facility] ENDOSCOPY;  Service: Gastroenterology;  Laterality: N/A;   COLONOSCOPY WITH PROPOFOL  N/A 09/23/2021   Procedure: COLONOSCOPY WITH PROPOFOL ;  Surgeon: San Sandor GAILS, DO;  Location: [facility] ENDOSCOPY;  Service: Gastroenterology;  Laterality: N/A;   ENTEROSCOPY N/A 09/23/2021   Procedure: ENTEROSCOPY;  Surgeon: San Sandor GAILS, DO;  Location: [facility] ENDOSCOPY;  Service: Gastroenterology;  Laterality: N/A;  Will place order for video capsule study as we may opt to place it during procedure   ESOPHAGOGASTRODUODENOSCOPY N/A 08/01/2019   Procedure: ESOPHAGOGASTRODUODENOSCOPY (EGD);  Surgeon: Kristie Lamprey, MD;  Location: [facility] ENDOSCOPY;  Service: Endoscopy;  Laterality: N/A;   ESOPHAGOGASTRODUODENOSCOPY N/A 11/18/2020   Procedure: ESOPHAGOGASTRODUODENOSCOPY (EGD);  Surgeon: Stacia Glendia BRAVO, MD;  Location: [facility] ENDOSCOPY;  Service: Gastroenterology;  Laterality: N/A;   ESOPHAGOGASTRODUODENOSCOPY (EGD) WITH PROPOFOL  N/A 08/04/2019   Procedure: ESOPHAGOGASTRODUODENOSCOPY (EGD) WITH PROPOFOL ;  Surgeon: Leigh Elspeth SQUIBB, MD;  Location: [facility] ENDOSCOPY;  Service: Gastroenterology;  Laterality: N/A;   ESOPHAGOGASTRODUODENOSCOPY (EGD) WITH PROPOFOL  N/A 09/13/2021   Procedure: ESOPHAGOGASTRODUODENOSCOPY (EGD) WITH PROPOFOL ;  Surgeon: Wilhelmenia Aloha Raddle., MD;  Location: [facility] ENDOSCOPY;  Service: Gastroenterology;  Laterality: N/A;   GIVENS CAPSULE STUDY N/A 09/23/2021   Procedure: GIVENS CAPSULE STUDY;  Surgeon: San Sandor GAILS, DO;  Location: [facility] ENDOSCOPY;  Service:  Gastroenterology;  Laterality: N/A;   graft left arm Left    for dialysis x 2. Removed   HOT HEMOSTASIS  11/18/2020   Procedure: HOT HEMOSTASIS (ARGON PLASMA COAGULATION/BICAP);  Surgeon: Stacia Glendia BRAVO, MD;  Location: [facility] ENDOSCOPY;  Service: Gastroenterology;;   HOT HEMOSTASIS N/A 09/23/2021   Procedure: HOT HEMOSTASIS (ARGON PLASMA COAGULATION/BICAP);  Surgeon: San Sandor GAILS, DO;  Location: [facility] ENDOSCOPY;  Service: Gastroenterology;  Laterality: N/A;   INSERTION OF DIALYSIS CATHETER     Rt chest   LAPAROSCOPIC RIGHT COLECTOMY N/A 08/05/2015   Procedure: LAPAROSCOPIC RIGHT COLECTOMY- ASCENDING;  Surgeon: Jina Nephew, MD;  Location: [facility] OR;  Service: General;  Laterality: N/A;   MASS EXCISION Left 05/28/2019   Procedure: EXCISION SOFT TISSUE MASS LEFT SHOULDER;  Surgeon: Eletha Boas, MD;  Location: [facility] ORS;  Service: General;  Laterality: Left;   NEPHRECTOMY Bilateral    PARATHYROIDECTOMY N/A 06/12/2016   Procedure: TOTAL PARATHYROIDECTOMY WITH AUTOTRANSPLANTATION TO LEFT FOREARM;  Surgeon: Boas Eletha, MD;  Location: [facility] OR;  Service: General;  Laterality: N/A;   PARATHYROIDECTOMY N/A 10/23/2016   Procedure: PARATHYROIDECTOMY;  Surgeon: Eletha Boas, MD;  Location: [facility] OR;  Service: General;  Laterality: N/A;   REVERSE SHOULDER ARTHROPLASTY Right 08/24/2020   Procedure: REVERSE SHOULDER ARTHROPLASTY;  Surgeon: Cristy Bonner DASEN, MD;  Location: [facility] OR;  Service: Orthopedics;  Laterality: Right;   REVISON OF ARTERIOVENOUS FISTULA Right 07/16/2017   Procedure: REVISION OF ARTERIOVENOUS FISTULA  Right ARM;  Surgeon: Sheree Penne Bruckner, MD;  Location: [facility] OR;  Service: Vascular;  Laterality: Right;   TOTAL HIP ARTHROPLASTY Left 11/14/2020   Procedure: LEFT TOTAL HIP ARTHROPLASTY ANTERIOR APPROACH;  Surgeon: [name] Kay HERO, MD;  Location: [facility] OR;  Service: Orthopedics;  Laterality: Left;   UPPER GASTROINTESTINAL ENDOSCOPY  2021   @ [facility]   Social History   Occupational History   Occupation:  retired    Associate Professor: SELF EMPLOYED  Tobacco Use   Smoking status: Former    Current packs/day: 0.00    Types: Cigarettes  Quit date: 06/19/1990    Years since quitting: 33.2   Smokeless tobacco: Never   Tobacco comments:    rarely cigar  Vaping Use   Vaping status: Never Used  Substance and Sexual Activity   Alcohol use: Not Currently    Alcohol/week: 1.0 standard drink of alcohol    Types: 1 Cans of beer per week    Comment: occasional beer   Drug use: Not Currently    Types: Marijuana    Comment: Last use several yrs ago   Sexual activity: Not Currently

## 2023-09-17 ENCOUNTER — Encounter: Admitting: Orthopaedic Surgery

## 2023-09-17 DIAGNOSIS — Z992 Dependence on renal dialysis: Secondary | ICD-10-CM | POA: Diagnosis not present

## 2023-09-17 DIAGNOSIS — H6123 Impacted cerumen, bilateral: Secondary | ICD-10-CM | POA: Diagnosis not present

## 2023-09-17 DIAGNOSIS — E7800 Pure hypercholesterolemia, unspecified: Secondary | ICD-10-CM | POA: Diagnosis not present

## 2023-09-17 DIAGNOSIS — Z79899 Other long term (current) drug therapy: Secondary | ICD-10-CM | POA: Diagnosis not present

## 2023-09-17 DIAGNOSIS — Z0000 Encounter for general adult medical examination without abnormal findings: Secondary | ICD-10-CM | POA: Diagnosis not present

## 2023-09-17 DIAGNOSIS — N186 End stage renal disease: Secondary | ICD-10-CM | POA: Diagnosis not present

## 2023-09-25 ENCOUNTER — Encounter: Payer: Self-pay | Admitting: Physical Therapy

## 2023-09-25 ENCOUNTER — Ambulatory Visit: Attending: Registered Nurse | Admitting: Physical Therapy

## 2023-09-25 DIAGNOSIS — D509 Iron deficiency anemia, unspecified: Secondary | ICD-10-CM | POA: Diagnosis not present

## 2023-09-25 DIAGNOSIS — R197 Diarrhea, unspecified: Secondary | ICD-10-CM | POA: Diagnosis not present

## 2023-09-25 DIAGNOSIS — R296 Repeated falls: Secondary | ICD-10-CM | POA: Insufficient documentation

## 2023-09-25 DIAGNOSIS — M6281 Muscle weakness (generalized): Secondary | ICD-10-CM | POA: Insufficient documentation

## 2023-09-25 DIAGNOSIS — R262 Difficulty in walking, not elsewhere classified: Secondary | ICD-10-CM | POA: Insufficient documentation

## 2023-09-25 DIAGNOSIS — M25511 Pain in right shoulder: Secondary | ICD-10-CM | POA: Insufficient documentation

## 2023-09-25 DIAGNOSIS — Z992 Dependence on renal dialysis: Secondary | ICD-10-CM | POA: Diagnosis not present

## 2023-09-25 DIAGNOSIS — M25611 Stiffness of right shoulder, not elsewhere classified: Secondary | ICD-10-CM | POA: Insufficient documentation

## 2023-09-25 DIAGNOSIS — G8929 Other chronic pain: Secondary | ICD-10-CM | POA: Diagnosis not present

## 2023-09-25 DIAGNOSIS — N186 End stage renal disease: Secondary | ICD-10-CM | POA: Diagnosis not present

## 2023-09-25 DIAGNOSIS — R2681 Unsteadiness on feet: Secondary | ICD-10-CM | POA: Diagnosis not present

## 2023-09-25 DIAGNOSIS — R2689 Other abnormalities of gait and mobility: Secondary | ICD-10-CM | POA: Insufficient documentation

## 2023-09-25 DIAGNOSIS — D631 Anemia in chronic kidney disease: Secondary | ICD-10-CM | POA: Diagnosis not present

## 2023-09-25 DIAGNOSIS — M25561 Pain in right knee: Secondary | ICD-10-CM | POA: Insufficient documentation

## 2023-09-25 DIAGNOSIS — N2581 Secondary hyperparathyroidism of renal origin: Secondary | ICD-10-CM | POA: Diagnosis not present

## 2023-09-25 DIAGNOSIS — D689 Coagulation defect, unspecified: Secondary | ICD-10-CM | POA: Diagnosis not present

## 2023-09-25 NOTE — Therapy (Signed)
 OUTPATIENT PHYSICAL THERAPY LOWER EXTREMITY TREATMENT   Patient Name: Carl Tamargo Sr. MRN: [id number] DOB:[birth year], 63 y.o., male Today's Date: 09/25/2023  END OF SESSION:  PT End of Session - 09/25/23 1100     Visit Number 2    Date for PT Re-Evaluation 12/05/23    PT Start Time 1100    PT Stop Time 1145    PT Time Calculation (min) 45 min    Activity Tolerance Patient tolerated treatment well    Behavior During Therapy WFL for tasks assessed/performed          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    Parathyroid  abnormality (HCC)    ectopic parathyroid  gland   Presence of arteriovenous fistula for hemodialysis, primary (HCC)    RUE PER PT RLE   Refusal of blood product    NO WHOLE BLOOD PROUCTS   Renal cell carcinoma (HCC)    s/p hand assisted laparoscopic bilateral nephrectomies 11/29/17, + RCC left   Secondary hyperparathyroidism (HCC)    Seizures (HCC)    one episode in past, due to elevated Potassium 08/02/20- at least 4 years ago   Sleep apnea    doesn't use CPAP anymore since weight loss   Stroke [facility])    no residual   Past Surgical History:  Procedure Laterality Date   ANTERIOR CERVICAL CORPECTOMY N/A 05/25/2023   Procedure: ANTERIOR CERVICAL CORPECTOMY CERVICAL FOUR, CERVICAL THREE - CERVICAL FIVE FUSION;  Surgeon: [name] Shove, MD;  Location: [facility] OR;  Service: Neurosurgery;  Laterality: N/A;   AV FISTULA PLACEMENT Right     right arm   BIOPSY  08/01/2019   Procedure: BIOPSY;  Surgeon: Kristie Lamprey, MD;  Location: [facility] ENDOSCOPY;  Service: Endoscopy;;   BIOPSY  11/18/2020   Procedure: BIOPSY;  Surgeon: Stacia Glendia BRAVO, MD;  Location: [facility] ENDOSCOPY;  Service: Gastroenterology;;   BIOPSY  09/13/2021   Procedure: BIOPSY;  Surgeon: Wilhelmenia Aloha Raddle., MD;  Location: [facility] ENDOSCOPY;  Service: Gastroenterology;;   CATARACT EXTRACTION W/ INTRAOCULAR LENS  IMPLANT, BILATERAL     COLON SURGERY     COLONOSCOPY N/A 08/04/2015   Procedure: COLONOSCOPY;  Surgeon: Gordy CHRISTELLA Starch, MD;  Location: [facility] ENDOSCOPY;  Service: Endoscopy;  Laterality: N/A;   COLONOSCOPY  2017   JMP@ [facility]-good prep-mass -recall 1 yr   COLONOSCOPY N/A 09/13/2021   Procedure: COLONOSCOPY;  Surgeon: Mansouraty, Aloha Raddle., MD;  Location: [facility] ENDOSCOPY;  Service: Gastroenterology;  Laterality: N/A;   COLONOSCOPY WITH PROPOFOL  N/A 11/25/2020   Procedure: COLONOSCOPY WITH PROPOFOL ;  Surgeon: Federico Rosario BROCKS, MD;  Location: [facility] ENDOSCOPY;  Service: Gastroenterology;  Laterality: N/A;   COLONOSCOPY WITH PROPOFOL  N/A 09/23/2021   Procedure: COLONOSCOPY WITH PROPOFOL ;  Surgeon: San Sandor GAILS, DO;  Location: [facility] ENDOSCOPY;  Service: Gastroenterology;  Laterality: N/A;   ENTEROSCOPY N/A 09/23/2021   Procedure: ENTEROSCOPY;  Surgeon: San Sandor GAILS, DO;  Location: [facility] ENDOSCOPY;  Service: Gastroenterology;  Laterality: N/A;  Will place order for video capsule study as we may opt to place it during procedure   ESOPHAGOGASTRODUODENOSCOPY N/A 08/01/2019   Procedure: ESOPHAGOGASTRODUODENOSCOPY (EGD);  Surgeon: Kristie Lamprey, MD;  Location: [facility] ENDOSCOPY;  Service: Endoscopy;  Laterality: N/A;   ESOPHAGOGASTRODUODENOSCOPY N/A 11/18/2020   Procedure: ESOPHAGOGASTRODUODENOSCOPY (EGD);  Surgeon: Stacia Glendia BRAVO, MD;  Location: [facility] ENDOSCOPY;  Service: Gastroenterology;  Laterality: N/A;   ESOPHAGOGASTRODUODENOSCOPY (EGD) WITH PROPOFOL  N/A 08/04/2019   Procedure:  ESOPHAGOGASTRODUODENOSCOPY (EGD) WITH PROPOFOL ;  Surgeon: Leigh Elspeth SQUIBB, MD;  Location: [facility] ENDOSCOPY;  Service: Gastroenterology;  Laterality: N/A;   ESOPHAGOGASTRODUODENOSCOPY (EGD) WITH PROPOFOL  N/A 09/13/2021   Procedure: ESOPHAGOGASTRODUODENOSCOPY (EGD) WITH PROPOFOL ;  Surgeon: Wilhelmenia Aloha Raddle., MD;  Location: [facility] ENDOSCOPY;  Service: Gastroenterology;  Laterality: N/A;   GIVENS CAPSULE STUDY N/A 09/23/2021   Procedure: GIVENS CAPSULE STUDY;  Surgeon: San Sandor GAILS, DO;  Location: [facility] ENDOSCOPY;  Service: Gastroenterology;  Laterality: N/A;   graft left arm Left    for dialysis x 2. Removed   HOT HEMOSTASIS  11/18/2020   Procedure: HOT HEMOSTASIS (ARGON PLASMA COAGULATION/BICAP);  Surgeon: Stacia Glendia BRAVO, MD;  Location: [facility] ENDOSCOPY;  Service: Gastroenterology;;   HOT HEMOSTASIS N/A 09/23/2021   Procedure: HOT HEMOSTASIS (ARGON PLASMA COAGULATION/BICAP);  Surgeon: San Sandor GAILS, DO;  Location: [facility] ENDOSCOPY;  Service: Gastroenterology;  Laterality: N/A;   INSERTION OF DIALYSIS CATHETER     Rt chest   LAPAROSCOPIC RIGHT COLECTOMY N/A 08/05/2015   Procedure: LAPAROSCOPIC RIGHT COLECTOMY- ASCENDING;  Surgeon: Jina Nephew, MD;  Location: [facility] OR;  Service: General;  Laterality: N/A;   MASS EXCISION Left 05/28/2019   Procedure: EXCISION SOFT TISSUE MASS LEFT SHOULDER;  Surgeon: Eletha Boas, MD;  Location: [facility] ORS;  Service: General;  Laterality: Left;   NEPHRECTOMY Bilateral    PARATHYROIDECTOMY N/A 06/12/2016   Procedure: TOTAL PARATHYROIDECTOMY WITH AUTOTRANSPLANTATION TO LEFT FOREARM;  Surgeon: Boas Eletha, MD;  Location: [name] OR;  Service: General;  Laterality: N/A;   PARATHYROIDECTOMY N/A 10/23/2016   Procedure: PARATHYROIDECTOMY;  Surgeon: Eletha Boas, MD;  Location: [facility] OR;  Service: General;  Laterality: N/A;   REVERSE SHOULDER ARTHROPLASTY Right 08/24/2020   Procedure: REVERSE SHOULDER ARTHROPLASTY;  Surgeon: Cristy Bonner DASEN, MD;  Location: [facility] OR;  Service: Orthopedics;   Laterality: Right;   REVISON OF ARTERIOVENOUS FISTULA Right 07/16/2017   Procedure: REVISION OF ARTERIOVENOUS FISTULA  Right ARM;  Surgeon: Sheree Penne Bruckner, MD;  Location: [facility] OR;  Service: Vascular;  Laterality: Right;   TOTAL HIP ARTHROPLASTY Left 11/14/2020   Procedure: LEFT TOTAL HIP ARTHROPLASTY ANTERIOR APPROACH;  Surgeon: [name] Kay HERO, MD;  Location: [facility] OR;  Service: Orthopedics;  Laterality: Left;   UPPER GASTROINTESTINAL ENDOSCOPY  2021   @ [facility]   Patient Active Problem List   Diagnosis Date Noted   Right hip pain 07/14/2023   Coping style affecting medical condition 06/05/2023   Spinal cord injury, cervical region, sequela (HCC) 05/30/2023   Cervical vertebral fracture (HCC) 05/25/2023   History of GI bleed 05/25/2023   C4 cervical fracture (HCC) 05/25/2023   Duodenitis    Gastritis and gastroduodenitis    Ischemic colitis (HCC)    Bright red blood per rectum    ABLA (acute blood loss anemia)    Gastric ulcer    Duodenal ulcer    Diverticulosis of colon without hemorrhage  GI bleed 09/17/2021   Acute upper GI bleeding 09/11/2021   Mucosal abnormality of esophagus    Mucosal abnormality of stomach    Mucosal abnormality of duodenum    Upper GI bleed    Left hip pain 11/16/2020   Type 2 diabetes mellitus with other specified complication (HCC) 11/14/2020   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/14/2020   Primary osteoarthritis of left hip 10/07/2020   Status post reverse total arthroplasty of right shoulder 08/24/2020   Diverticulitis 03/08/2020   ESRD (end stage renal disease) (HCC) 03/08/2020   Polyneuropathy in diseases classified elsewhere (HCC) 03/08/2020   Pure hypercholesterolemia 03/08/2020   Sacral back pain 03/08/2020   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 11/10/2019   Acute gastric ulcer with hemorrhage    Acute GI bleeding 07/31/2019   Hypotension of hemodialysis 07/31/2019   Diverticulitis large intestine 07/31/2019   Mass of soft  tissue of shoulder 05/26/2019   Awaiting organ transplant status 03/18/2019   Finding of cocaine in blood 02/25/2019   Anaphylactic shock, unspecified, sequela 11/21/2018   Hypercalcemia 10/28/2018   Diarrhea, unspecified 08/08/2018   Hyperkalemia 03/12/2018   Renal cell carcinoma of left kidney (HCC) 12/24/2017   Fluid overload, unspecified 07/06/2017   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   Other specified disorders of kidney and ureter 11/22/2016   Secondary hyperparathyroidism of renal origin (HCC) 10/23/2016   Headache disorder 08/21/2016   TIA (transient ischemic attack)    Essential hypertension    Seizure (HCC)    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    Lung nodule    Diverticulitis of cecum 07/21/2015   Diverticulitis of large intestine without perforation or abscess without bleeding    Right lower quadrant pain    Obstructive sleep apnea 02/08/2015   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   Diverticulosis 07/13/2014   Lower GI bleed 07/12/2014   Refusal of blood transfusions as patient is Jehovah's Witness    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   HLD (hyperlipidemia)    PRES (posterior reversible encephalopathy syndrome)    History of CVA (cerebrovascular accident) 05/23/2014   Anemia of chronic disease 05/23/2014   GERD (gastroesophageal reflux disease) 12/09/2012   Gastrointestinal bleeding 11/12/2012   Melena 11/12/2012   Other psychoactive substance dependence, uncomplicated (HCC) 10/08/2012   Iron deficiency anemia, unspecified 06/10/2012   Moderate protein-calorie malnutrition (HCC) 07/29/2009   Coagulation defect, unspecified (HCC) 08/29/2006   Type 2 diabetes mellitus with diabetic peripheral angiopathy without gangrene (HCC) 08/29/2006   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 stage renal disease (HCC) 08/02/2006    PCP: [name] MD   REFERRING PROVIDER: Debby Fidela CROME, NP  REFERRING DIAG:  Diagnosis  S14.109S (ICD-10-CM) - Spinal cord injury, cervical region, sequela (HCC)    THERAPY DIAG:  Muscle weakness (generalized)  Difficulty in walking, not elsewhere classified  Other abnormalities of gait and mobility  Unsteadiness on feet  Repeated falls  Rationale for Evaluation and Treatment: Rehabilitation  ONSET DATE:  ACDF 05/25/23  SUBJECTIVE:   SUBJECTIVE STATEMENT:  Tired from dialysis today, goal to get rid of the walker   Had surgery in April after a fall- the doctors said that my cervical cord was narrowing anyway, they discovered the extent after I came in to be seen after the fall. Ended up in rehab in [place] for 30 days. Went from [facility] to walker, then  went to [facility] for more rehab after I DCed from [place]. Can't get in my convertible no more, its too short. R side is still weaker than the L. Have had a couple of falls since getting out of the hospital.   PERTINENT HISTORY: See above   ACDF C3-C5 fusion, anterior cervical corpectomy C3-C4  PAIN:  Are you having pain? Pain is off and on, R hip may need surgery in about 6 months, knees are bone on bone, no number/10 given   PRECAUTIONS:  Precautions:  Precautions Precautions: Fall, Cervical, Other (comment) Recall of Precautions/Restrictions: Impaired Precaution/Restrictions Comments: Verbally reviewed cervical precautions. Required Braces or Orthoses: Cervical Brace Cervical Brace: Soft collar, At all times Restrictions Weight Bearing Restrictions Per Provider Order: No General: Pain: No c/o pain.   RED FLAGS: None   WEIGHT BEARING RESTRICTIONS: No  FALLS:  Has patient fallen in last 6 months? Yes. Number of falls 3- one that led to surgery, then 2 after getting home from the hospital  LIVING ENVIRONMENT: Lives with: lives with their son Lives in: House/apartment Stairs: ground floor apt  Has following  equipment at home: Single point cane, Environmental consultant - 2 wheeled, and Environmental consultant - 4 wheeled  OCCUPATION: retired- used to pressure wash   PLOF: Independent, Independent with basic ADLs, Independent with gait, and Independent with transfers  PATIENT GOALS: get back to normal ability, drive convertible again   NEXT MD VISIT: Dr. Cornelio 11/15/23  OBJECTIVE:  Note: Objective measures were completed at Evaluation unless otherwise noted.  DIAGNOSTIC FINDINGS:    CLINICAL DATA:  63 year old male with altered mental status.   EXAM: CT HEAD WITHOUT CONTRAST   TECHNIQUE: Contiguous axial images were obtained from the base of the skull through the vertex without intravenous contrast.   RADIATION DOSE REDUCTION: This exam was performed according to the departmental dose-optimization program which includes automated exposure control, adjustment of the mA and/or kV according to patient size and/or use of iterative reconstruction technique.   COMPARISON:  Brain MRI and head CT 05/25/2023.   FINDINGS: Brain:   Unusual extensive, diffuse pachymeningeal calcification which has been present since at least 2013. Stable cerebral volume. No midline shift, ventriculomegaly, mass effect, evidence of mass lesion, intracranial hemorrhage or evidence of cortically based acute infarction. Small area of chronic encephalomalacia in the medial left occipital lobe redemonstrated. Stable gray-white matter differentiation throughout the brain.   Vascular: Chronic severe pachymeningeal calcification.   Skull: Appears stable.  No acute osseous abnormality identified.   Sinuses/Orbits: Continued left middle ear and mastoid opacification. Other Visualized paranasal sinuses and mastoids are stable and well aerated.   Other: Anterior left scalp soft tissue injury has regressed since earlier in the month. Orbits soft tissues appears stable.   IMPRESSION: 1. No acute intracranial abnormality. 2. Advanced chronic  pachymeningeal calcification, etiology unclear. Small chronic left PCA territory infarct. 3. Ongoing left middle ear and mastoid opacification. No acute osseous abnormality identified. 4. Regressed scalp soft tissue injury since 05/25/2023.  CLINICAL DATA:  Chronic bilateral knee pain and swelling.   EXAM: RIGHT KNEE - COMPLETE 4+ VIEW; LEFT KNEE - COMPLETE 4+ VIEW   COMPARISON:  Right knee radiographs dated 12/26/2021.   FINDINGS: Right knee:   No acute fracture or dislocation. There is a small to moderate-sized joint effusion. Moderate tricompartmental degenerative changes with joint space narrowing and marginal osteophytosis. Vascular calcifications are noted.   Left knee:   No acute fracture or dislocation. There is a small joint effusion. Moderate tricompartmental  degenerative changes with joint space narrowing and marginal osteophytosis. Vascular calcifications are noted.   IMPRESSION: 1. No acute osseous abnormality. 2. 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  Narrative & Impression  CLINICAL DATA:  Chronic bilateral knee pain and swelling.   EXAM: RIGHT KNEE - COMPLETE 4+ VIEW; LEFT KNEE - COMPLETE 4+ VIEW   COMPARISON:  Right knee radiographs dated 12/26/2021.   FINDINGS: Right knee:   No acute fracture or dislocation. There is a small to moderate-sized joint effusion. Moderate tricompartmental degenerative changes with joint space narrowing and marginal osteophytosis. Vascular calcifications are noted.   Left knee:   No acute fracture or dislocation. There is a small joint effusion. Moderate tricompartmental degenerative changes with joint space narrowing and marginal osteophytosis. Vascular calcifications are noted.   IMPRESSION: 1. No acute osseous abnormality. 2. Moderate tricompartmental degenerative changes of the bilateral knees with small to moderate-sized knee  joint effusions.    Narrative & Impression CLINICAL DATA:  Right hip pain.   EXAM: DG HIP (WITH OR WITHOUT PELVIS) 2-3V RIGHT   COMPARISON:  None Available.   FINDINGS: No acute fracture or dislocation. The bones are osteopenic. Cystic changes of the right femoral head and neck measure up to 3.7 cm. Orthopedic referral is advised. Mild arthritic changes of the right hip. Total left hip arthroplasty. The soft tissues are unremarkable. Vascular calcifications.   IMPRESSION: 1. No acute fracture or dislocation. 2. Cystic changes of the right femoral head and neck. Orthopedic referral is advised.  Lower Venous DVT Study   Patient Name:  Carl Cinque Sr.  Date of Exam:   06/10/2023  Medical Rec #: [id number]            Accession #:    [id number]  Date of Birth: January 02, 1961             Patient Gender: M  Patient Age:   71 years  Exam Location:  [facility]  Procedure:      VAS US  LOWER EXTREMITY VENOUS (DVT)  Referring Phys: PRENTICE COMPTON    ---------------------------------------------------------------------------  -----    Indications: Edema.    Risk Factors: Surgery Cervical corpectomy 05/26/23.  Comparison Study: No significant changes seen since previous exam 07/04/06.   Performing Technologist: Garnette Rockers     Examination Guidelines:  A complete evaluation includes B-mode imaging, spectral Doppler, color  Doppler,  and power Doppler as needed of all accessible portions of each vessel.  Bilateral  testing is considered an integral part of a complete examination. Limited  examinations for reoccurring indications may be performed as noted. The  reflux  portion of the exam is performed with the patient in reverse  Trendelenburg.      +---------+---------------+---------+-----------+----------+--------------+   RIGHT   CompressibilityPhasicitySpontaneityPropertiesThrombus  Aging   +---------+---------------+---------+-----------+----------+--------------+   CFV     Full           Yes      Yes                                   +---------+---------------+---------+-----------+----------+--------------+   SFJ     Full                                                          +---------+---------------+---------+-----------+----------+--------------+  FV Prox  Full                                                          +---------+---------------+---------+-----------+----------+--------------+   FV Mid   Full                                                          +---------+---------------+---------+-----------+----------+--------------+   FV DistalFull                                                          +---------+---------------+---------+-----------+----------+--------------+   PFV     Full                                                          +---------+---------------+---------+-----------+----------+--------------+   POP     Full           Yes      Yes                                   +---------+---------------+---------+-----------+----------+--------------+   PTV     Full                    Yes                                   +---------+---------------+---------+-----------+----------+--------------+   PERO    Full                    Yes                                   +---------+---------------+---------+-----------+----------+--------------+          +---------+---------------+---------+-----------+----------+--------------+   LEFT    CompressibilityPhasicitySpontaneityPropertiesThrombus  Aging  +---------+---------------+---------+-----------+----------+--------------+   CFV     Full           Yes      Yes                                   +---------+---------------+---------+-----------+----------+--------------+   SFJ     Full                                                           +---------+---------------+---------+-----------+----------+--------------+   FV Prox  Full                                                          +---------+---------------+---------+-----------+----------+--------------+  FV Mid   Full                                                          +---------+---------------+---------+-----------+----------+--------------+   FV DistalFull                                                          +---------+---------------+---------+-----------+----------+--------------+   PFV     Full                                                          +---------+---------------+---------+-----------+----------+--------------+   POP     Full           Yes      Yes                                   +---------+---------------+---------+-----------+----------+--------------+   PTV     Full                    Yes                                   +---------+---------------+---------+-----------+----------+--------------+   PERO    Full                    Yes                                   +---------+---------------+---------+-----------+----------+--------------+               Summary:  BILATERAL:  - No evidence of deep vein thrombosis seen in the lower extremities,  bilaterally.  -No evidence of popliteal cyst, bilaterally.    PATIENT SURVEYS:  PSFS: THE PATIENT SPECIFIC FUNCTIONAL SCALE  Place score of 0-10 (0 = unable to perform activity and 10 = able to perform activity at the same level as before injury or problem)  Activity Date: eval 09/12/23    Getting in and out of car (anything lower than 20 inches)  0    2. Balance  3    3. Walking without a device  2    4.  Social activities  0    Total Score 1.25      Total Score = Sum of activity scores/number of activities  Minimally Detectable Change: 3 points (for single  activity); 2 points (for average score)  [employer] [facility] [facility] (nd). The Patient Specific Functional Scale . Retrieved from [email or web address]   COGNITION: Overall cognitive status: Within functional limits for tasks assessed     SENSATION: Hx of neuropathy even before fall/surgery     LOWER EXTREMITY MMT:  MMT Right eval Left eval  Hip flexion 3- 3+  Hip extension    Hip abduction  Hip adduction    Hip internal rotation    Hip external rotation    Knee flexion 3- 4+  Knee extension 3- 5  Ankle dorsiflexion 4 5  Ankle plantarflexion    Ankle inversion    Ankle eversion     (Blank rows = not tested)    GAIT: Distance walked: in clinic distances  Assistive device utilized: Environmental consultant - 2 wheeled Level of assistance: Modified independence Comments: slow but steady with RW                                                                                                                                TREATMENT DATE:  09/25/23 NuStep L 5 x 6 min LAQ RLE no weight, LLE 3lb cuff 2x10  Seated match 3lb 2x10 HS curls green 2x15 Sit to stand form elevated surface 2x5 with UE use Side steps at mat  Standing march in [facility]  09/12/23  Eval, POC      PATIENT EDUCATION:  Education details: exam findings, POC Person educated: Patient Education method: Explanation Education comprehension: verbalized understanding, returned demonstration, and needs further education  HOME EXERCISE PROGRAM: TBD   ASSESSMENT:  CLINICAL IMPRESSION: Patient is a 63 y.o. M who was seen today for physical therapy evaluation and treatment for S14.109S (ICD-10-CM) - Spinal cord injury, cervical region, sequela (HCC) Pt tolerated a initial progression to therapy well. RLE is significantly weaker than R. Pt had log legs and a relatively short torso making it difficult to stand from low surfaces in conjunction with weak LE. Decrease coordination  with side steps. No pain during session. Limited ROM with RLE during isolated exercises.  Will make every effort to improve function and work towards goals.   OBJECTIVE IMPAIRMENTS: Abnormal gait, decreased activity tolerance, decreased balance, decreased coordination, decreased knowledge of use of DME, decreased mobility, difficulty walking, decreased strength, postural dysfunction, and pain.   ACTIVITY LIMITATIONS: standing, squatting, stairs, transfers, bed mobility, toileting, dressing, and locomotion level  PARTICIPATION LIMITATIONS: shopping, community activity, and yard work  PERSONAL FACTORS: Age, Behavior pattern, Education, Fitness, Past/current experiences, Profession, Social background, and Time since onset of injury/illness/exacerbation are also affecting patient's functional outcome.   REHAB POTENTIAL: Good  CLINICAL DECISION MAKING: Evolving/moderate complexity  EVALUATION COMPLEXITY: Moderate   GOALS: Goals reviewed with patient? No  SHORT TERM GOALS: Target date: 10/24/2023   Will be compliant with appropriate progressive HEP  Baseline: Goal status: INITIAL  2.  Will be able to consistently perform functional STS tranfers from surfaces 20 inches or less  Baseline:  Goal status: INITIAL  3.  Will be able to ambulate at least 318ft with quad cane no more than light MinA  Baseline:  Goal status: INITIAL  4.  MMT to be at least 3+/5 in all tested groups R LE  Baseline:  Goal status: INITIAL    LONG TERM GOALS: Target date: 12/05/2023      R LE MMT  to be at least 4-/5 in all tested groups  Baseline:  Goal status: INITIAL  2.  Will score at least 18 on DGI with no device to show reduced fall risk  Baseline:  Goal status: INITIAL  3.  Will be able to reciprocally ascend/descend steps without difficulty and U rail  Baseline:  Goal status: INITIAL  4.  Will be able to ambulate community distances with LRAD, Mod(I) to allow for improved community access  and participating in social activities  Baseline:  Goal status: INITIAL  5.  Will be compliant with appropriate gym based exercise program by time of DC  Baseline:  Goal status: INITIAL    PLAN:  PT FREQUENCY: 2x/week  PT DURATION: 12 weeks  PLANNED INTERVENTIONS: 97750- Physical Performance Testing, 97110-Therapeutic exercises, 97530- Therapeutic activity, 97112- Neuromuscular re-education, 97535- Self Care, 02859- Manual therapy, and 97116- Gait training  PLAN FOR NEXT SESSION: still needs HEP, generally needs balance, strength, conditioning, safety awareness training  Josette Rough, PT, DPT 09/25/23 11:01 AM

## 2023-09-30 DIAGNOSIS — D631 Anemia in chronic kidney disease: Secondary | ICD-10-CM | POA: Diagnosis not present

## 2023-10-01 ENCOUNTER — Ambulatory Visit: Admitting: Physical Therapy

## 2023-10-01 DIAGNOSIS — R296 Repeated falls: Secondary | ICD-10-CM

## 2023-10-01 DIAGNOSIS — M25611 Stiffness of right shoulder, not elsewhere classified: Secondary | ICD-10-CM

## 2023-10-01 DIAGNOSIS — R2689 Other abnormalities of gait and mobility: Secondary | ICD-10-CM | POA: Diagnosis not present

## 2023-10-01 DIAGNOSIS — R2681 Unsteadiness on feet: Secondary | ICD-10-CM | POA: Diagnosis not present

## 2023-10-01 DIAGNOSIS — M25511 Pain in right shoulder: Secondary | ICD-10-CM | POA: Diagnosis not present

## 2023-10-01 DIAGNOSIS — R262 Difficulty in walking, not elsewhere classified: Secondary | ICD-10-CM | POA: Diagnosis not present

## 2023-10-01 DIAGNOSIS — G8929 Other chronic pain: Secondary | ICD-10-CM | POA: Diagnosis not present

## 2023-10-01 DIAGNOSIS — M25561 Pain in right knee: Secondary | ICD-10-CM | POA: Diagnosis not present

## 2023-10-01 DIAGNOSIS — M6281 Muscle weakness (generalized): Secondary | ICD-10-CM | POA: Diagnosis not present

## 2023-10-01 NOTE — Therapy (Signed)
 OUTPATIENT PHYSICAL THERAPY LOWER EXTREMITY TREATMENT   Patient Name: Carl Hatchel Sr. MRN: [id number] DOB:17-Jun-1960, 63 y.o., male Today's Date: 10/01/2023  END OF SESSION:  PT End of Session - 10/01/23 1015     Visit Number 3    Number of Visits 25    Date for PT Re-Evaluation 12/05/23    Authorization Type [employer] Dual    Authorization Time Period 09/12/23 to 12/05/23    PT Start Time 1015    PT Stop Time 1100    PT Time Calculation (min) 45 min          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    Parathyroid  abnormality (HCC)    ectopic parathyroid  gland   Presence of arteriovenous fistula for hemodialysis, primary (HCC)    RUE PER PT RLE   Refusal of blood product    NO WHOLE BLOOD PROUCTS   Renal cell carcinoma (HCC)    s/p hand assisted laparoscopic bilateral nephrectomies 11/29/17, + RCC left   Secondary hyperparathyroidism (HCC)    Seizures (HCC)    one episode in past, due to elevated Potassium 08/02/20- at least 4 years ago   Sleep apnea    doesn't use CPAP anymore since weight loss   Stroke [facility])    no residual   Past Surgical History:  Procedure Laterality Date   ANTERIOR CERVICAL CORPECTOMY N/A 05/25/2023   Procedure: ANTERIOR CERVICAL CORPECTOMY CERVICAL FOUR, CERVICAL THREE - CERVICAL FIVE FUSION;  Surgeon: [name] Shove, MD;  Location: [facility] OR;  Service: Neurosurgery;  Laterality: N/A;   AV FISTULA PLACEMENT Right    right arm    BIOPSY  08/01/2019   Procedure: BIOPSY;  Surgeon: Kristie Lamprey, MD;  Location: [facility] ENDOSCOPY;  Service: Endoscopy;;   BIOPSY  11/18/2020   Procedure: BIOPSY;  Surgeon: Stacia Glendia BRAVO, MD;  Location: [facility] ENDOSCOPY;  Service: Gastroenterology;;   BIOPSY  09/13/2021   Procedure: BIOPSY;  Surgeon: Wilhelmenia Aloha Raddle., MD;  Location: [facility] ENDOSCOPY;  Service: Gastroenterology;;   CATARACT EXTRACTION W/ INTRAOCULAR LENS  IMPLANT, BILATERAL     COLON SURGERY     COLONOSCOPY N/A 08/04/2015   Procedure: COLONOSCOPY;  Surgeon: Gordy CHRISTELLA Starch, MD;  Location: [facility] ENDOSCOPY;  Service: Endoscopy;  Laterality: N/A;   COLONOSCOPY  2017   JMP@ [facility]-good prep-mass -recall 1 yr   COLONOSCOPY N/A 09/13/2021   Procedure: COLONOSCOPY;  Surgeon: Mansouraty, Aloha Raddle., MD;  Location: [facility] ENDOSCOPY;  Service: Gastroenterology;  Laterality: N/A;   COLONOSCOPY WITH PROPOFOL  N/A 11/25/2020   Procedure: COLONOSCOPY WITH PROPOFOL ;  Surgeon: Federico Rosario BROCKS, MD;  Location: [facility] ENDOSCOPY;  Service: Gastroenterology;  Laterality: N/A;   COLONOSCOPY WITH PROPOFOL  N/A 09/23/2021   Procedure: COLONOSCOPY WITH PROPOFOL ;  Surgeon: San Sandor GAILS, DO;  Location: [facility] ENDOSCOPY;  Service: Gastroenterology;  Laterality: N/A;   ENTEROSCOPY N/A 09/23/2021   Procedure: ENTEROSCOPY;  Surgeon: San Sandor GAILS, DO;  Location: [facility] ENDOSCOPY;  Service: Gastroenterology;  Laterality: N/A;  Will place order for video capsule study as we may opt to place it during procedure   ESOPHAGOGASTRODUODENOSCOPY N/A 08/01/2019   Procedure: ESOPHAGOGASTRODUODENOSCOPY (EGD);  Surgeon: Kristie Lamprey, MD;  Location: [facility] ENDOSCOPY;  Service: Endoscopy;  Laterality: N/A;   ESOPHAGOGASTRODUODENOSCOPY N/A 11/18/2020   Procedure: ESOPHAGOGASTRODUODENOSCOPY (EGD);  Surgeon: Stacia Glendia BRAVO, MD;  Location: [facility] ENDOSCOPY;  Service: Gastroenterology;  Laterality: N/A;   ESOPHAGOGASTRODUODENOSCOPY (EGD) WITH PROPOFOL  N/A 08/04/2019   Procedure:  ESOPHAGOGASTRODUODENOSCOPY (EGD) WITH PROPOFOL ;  Surgeon: Leigh Elspeth SQUIBB, MD;  Location: [facility] ENDOSCOPY;  Service: Gastroenterology;  Laterality: N/A;   ESOPHAGOGASTRODUODENOSCOPY (EGD) WITH PROPOFOL  N/A 09/13/2021   Procedure: ESOPHAGOGASTRODUODENOSCOPY (EGD) WITH PROPOFOL ;  Surgeon: Wilhelmenia Aloha Raddle., MD;  Location: [facility] ENDOSCOPY;  Service: Gastroenterology;  Laterality: N/A;   GIVENS CAPSULE STUDY N/A 09/23/2021   Procedure: GIVENS CAPSULE STUDY;  Surgeon: San Sandor GAILS, DO;  Location[facility] ENDOSCOPY;  Service: Gastroenterology;  Laterality: N/A;   graft left arm Left    for dialysis x 2. Removed   HOT HEMOSTASIS  11/18/2020   Procedure: HOT HEMOSTASIS (ARGON PLASMA COAGULATION/BICAP);  Surgeon: Stacia Glendia BRAVO, MD;  Location: [facility] ENDOSCOPY;  Service: Gastroenterology;;   HOT HEMOSTASIS N/A 09/23/2021   Procedure: HOT HEMOSTASIS (ARGON PLASMA COAGULATION/BICAP);  Surgeon: San Sandor GAILS, DO;  Location: [facility] ENDOSCOPY;  Service: Gastroenterology;  Laterality: N/A;   INSERTION OF DIALYSIS CATHETER     Rt chest   LAPAROSCOPIC RIGHT COLECTOMY N/A 08/05/2015   Procedure: LAPAROSCOPIC RIGHT COLECTOMY- ASCENDING;  Surgeon: Jina Nephew, MD;  Location: [facility] OR;  Service: General;  Laterality: N/A;   MASS EXCISION Left 05/28/2019   Procedure: EXCISION SOFT TISSUE MASS LEFT SHOULDER;  Surgeon: Eletha Boas, MD;  Location: [facility] ORS;  Service: General;  Laterality: Left;   NEPHRECTOMY Bilateral    PARATHYROIDECTOMY N/A 06/12/2016   Procedure: TOTAL PARATHYROIDECTOMY WITH AUTOTRANSPLANTATION TO LEFT FOREARM;  Surgeon: Boas Eletha, MD;  Location[facility] OR;  Service: General;  Laterality: N/A;   PARATHYROIDECTOMY N/A 10/23/2016   Procedure: PARATHYROIDECTOMY;  Surgeon: Eletha Boas, MD;  Location: [facility] OR;  Service: General;  Laterality: N/A;   REVERSE SHOULDER ARTHROPLASTY Right 08/24/2020   Procedure: REVERSE SHOULDER ARTHROPLASTY;  Surgeon: Cristy Bonner DASEN, MD;  Location: [facility] OR;  Service: Orthopedics;   Laterality: Right;   REVISON OF ARTERIOVENOUS FISTULA Right 07/16/2017   Procedure: REVISION OF ARTERIOVENOUS FISTULA  Right ARM;  Surgeon: Sheree Penne Bruckner, MD;  Location: [facility] OR;  Service: Vascular;  Laterality: Right;   TOTAL HIP ARTHROPLASTY Left 11/14/2020   Procedure: LEFT TOTAL HIP ARTHROPLASTY ANTERIOR APPROACH;  Surgeon: [name] Kay HERO, MD;  Location: [facility] OR;  Service: Orthopedics;  Laterality: Left;   UPPER GASTROINTESTINAL ENDOSCOPY  2021   @ [facility]   Patient Active Problem List   Diagnosis Date Noted   Right hip pain 07/14/2023   Coping style affecting medical condition 06/05/2023   Spinal cord injury, cervical region, sequela (HCC) 05/30/2023   Cervical vertebral fracture (HCC) 05/25/2023   History of GI bleed 05/25/2023   C4 cervical fracture (HCC) 05/25/2023   Duodenitis    Gastritis and gastroduodenitis    Ischemic colitis (HCC)    Bright red blood per rectum    ABLA (acute blood loss anemia)    Gastric ulcer    Duodenal ulcer    Diverticulosis of  colon without hemorrhage    GI bleed 09/17/2021   Acute upper GI bleeding 09/11/2021   Mucosal abnormality of esophagus    Mucosal abnormality of stomach    Mucosal abnormality of duodenum    Upper GI bleed    Left hip pain 11/16/2020   Type 2 diabetes mellitus with other specified complication (HCC) 11/14/2020   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/14/2020   Primary osteoarthritis of left hip 10/07/2020   Status post reverse total arthroplasty of right shoulder 08/24/2020   Diverticulitis 03/08/2020   ESRD (end stage renal disease) (HCC) 03/08/2020   Polyneuropathy in diseases classified elsewhere (HCC) 03/08/2020   Pure hypercholesterolemia 03/08/2020   Sacral back pain 03/08/2020   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 11/10/2019   Acute gastric ulcer with hemorrhage    Acute GI bleeding 07/31/2019   Hypotension of hemodialysis 07/31/2019   Diverticulitis large intestine 07/31/2019   Mass of soft  tissue of shoulder 05/26/2019   Awaiting organ transplant status 03/18/2019   Finding of cocaine in blood 02/25/2019   Anaphylactic shock, unspecified, sequela 11/21/2018   Hypercalcemia 10/28/2018   Diarrhea, unspecified 08/08/2018   Hyperkalemia 03/12/2018   Renal cell carcinoma of left kidney (HCC) 12/24/2017   Fluid overload, unspecified 07/06/2017   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   Other specified disorders of kidney and ureter 11/22/2016   Secondary hyperparathyroidism of renal origin (HCC) 10/23/2016   Headache disorder 08/21/2016   TIA (transient ischemic attack)    Essential hypertension    Seizure (HCC)    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    Lung nodule    Diverticulitis of cecum 07/21/2015   Diverticulitis of large intestine without perforation or abscess without bleeding    Right lower quadrant pain    Obstructive sleep apnea 02/08/2015   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   Diverticulosis 07/13/2014   Lower GI bleed 07/12/2014   Refusal of blood transfusions as patient is Jehovah's Witness    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   HLD (hyperlipidemia)    PRES (posterior reversible encephalopathy syndrome)    History of CVA (cerebrovascular accident) 05/23/2014   Anemia of chronic disease 05/23/2014   GERD (gastroesophageal reflux disease) 12/09/2012   Gastrointestinal bleeding 11/12/2012   Melena 11/12/2012   Other psychoactive substance dependence, uncomplicated (HCC) 10/08/2012   Iron deficiency anemia, unspecified 06/10/2012   Moderate protein-calorie malnutrition (HCC) 07/29/2009   Coagulation defect, unspecified (HCC) 08/29/2006   Type 2 diabetes mellitus with diabetic peripheral angiopathy without gangrene (HCC) 08/29/2006   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 stage renal disease (HCC) 08/02/2006    PCP: [name] MD   REFERRING PROVIDER: Debby Fidela CROME, NP  REFERRING DIAG:  Diagnosis  S14.109S (ICD-10-CM) - Spinal cord injury, cervical region, sequela (HCC)    THERAPY DIAG:  Muscle weakness (generalized)  Difficulty in walking, not elsewhere classified  Other abnormalities of gait and mobility  Unsteadiness on feet  Repeated falls  Stiffness of right shoulder, not elsewhere classified  Chronic right shoulder pain  Chronic pain of right knee  Rationale for Evaluation and Treatment: Rehabilitation  ONSET DATE:  ACDF 05/25/23  SUBJECTIVE:   SUBJECTIVE STATEMENT:  Better today, no dialysis.  Need to get off this walker- try ot walk in house without it but have to be very careful   Had surgery in April after a fall- the doctors  said that my cervical cord was narrowing anyway, they discovered the extent after I came in to be seen after the fall. Ended up in rehab in [place] for 30 days. Went from [facility] to walker, then went to [place] for more rehab after I DCed from [employer]. Can't get in my convertible no more, its too short. R side is still weaker than the L. Have had a couple of falls since getting out of the hospital.   PERTINENT HISTORY: See above   ACDF C3-C5 fusion, anterior cervical corpectomy C3-C4  PAIN:  Are you having pain? Pain is off and on, R hip may need surgery in about 6 months, knees are bone on bone, no number/10 given   PRECAUTIONS:  Precautions:  Precautions Precautions: Fall, Cervical, Other (comment) Recall of Precautions/Restrictions: Impaired Precaution/Restrictions Comments: Verbally reviewed cervical precautions. Required Braces or Orthoses: Cervical Brace Cervical Brace: Soft collar, At all times Restrictions Weight Bearing Restrictions Per Provider Order: No General: Pain: No c/o pain.   RED FLAGS: None   WEIGHT BEARING RESTRICTIONS: No  FALLS:  Has patient fallen in last 6 months? Yes. Number of falls 3- one that led to surgery,  then 2 after getting home from the hospital  LIVING ENVIRONMENT: Lives with: lives with their son Lives in: House/apartment Stairs: ground floor apt  Has following equipment at home: Single point cane, Environmental consultant - 2 wheeled, and Environmental consultant - 4 wheeled  OCCUPATION: retired- used to pressure wash   PLOF: Independent, Independent with basic ADLs, Independent with gait, and Independent with transfers  PATIENT GOALS: get back to normal ability, drive convertible again   NEXT MD VISIT: Dr. Cornelio 11/15/23  OBJECTIVE:  Note: Objective measures were completed at Evaluation unless otherwise noted.  DIAGNOSTIC FINDINGS:    CLINICAL DATA:  63 year old male with altered mental status.   EXAM: CT HEAD WITHOUT CONTRAST   TECHNIQUE: Contiguous axial images were obtained from the base of the skull through the vertex without intravenous contrast.   RADIATION DOSE REDUCTION: This exam was performed according to the departmental dose-optimization program which includes automated exposure control, adjustment of the mA and/or kV according to patient size and/or use of iterative reconstruction technique.   COMPARISON:  Brain MRI and head CT 05/25/2023.   FINDINGS: Brain:   Unusual extensive, diffuse pachymeningeal calcification which has been present since at least 2013. Stable cerebral volume. No midline shift, ventriculomegaly, mass effect, evidence of mass lesion, intracranial hemorrhage or evidence of cortically based acute infarction. Small area of chronic encephalomalacia in the medial left occipital lobe redemonstrated. Stable gray-white matter differentiation throughout the brain.   Vascular: Chronic severe pachymeningeal calcification.   Skull: Appears stable.  No acute osseous abnormality identified.   Sinuses/Orbits: Continued left middle ear and mastoid opacification. Other Visualized paranasal sinuses and mastoids are stable and well aerated.   Other: Anterior left scalp soft  tissue injury has regressed since earlier in the month. Orbits soft tissues appears stable.   IMPRESSION: 1. No acute intracranial abnormality. 2. 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 3. Ongoing left middle ear and mastoid opacification. No acute osseous abnormality identified. 4. Regressed scalp soft tissue injury since 05/25/2023.  CLINICAL DATA:  Chronic bilateral knee pain and swelling.   EXAM: RIGHT KNEE - COMPLETE 4+ VIEW; LEFT KNEE - COMPLETE 4+ VIEW   COMPARISON:  Right knee radiographs dated 12/26/2021.   FINDINGS: Right knee:   No acute fracture or dislocation. There is a small  to moderate-sized joint effusion. Moderate tricompartmental degenerative changes with joint space narrowing and marginal osteophytosis. Vascular calcifications are noted.   Left knee:   No acute fracture or dislocation. There is a small joint effusion. Moderate tricompartmental degenerative changes with joint space narrowing and marginal osteophytosis. Vascular calcifications are noted.   IMPRESSION: 1. No acute osseous abnormality. 2. 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  Narrative & Impression  CLINICAL DATA:  Chronic bilateral knee pain and swelling.   EXAM: RIGHT KNEE - COMPLETE 4+ VIEW; LEFT KNEE - COMPLETE 4+ VIEW   COMPARISON:  Right knee radiographs dated 12/26/2021.   FINDINGS: Right knee:   No acute fracture or dislocation. There is a small to moderate-sized joint effusion. Moderate tricompartmental degenerative changes with joint space narrowing and marginal osteophytosis. Vascular calcifications are noted.   Left knee:   No acute fracture or dislocation. There is a small joint effusion. Moderate tricompartmental degenerative changes with joint space narrowing and marginal osteophytosis. Vascular calcifications  are noted.   IMPRESSION: 1. No acute osseous abnormality. 2. 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    Narrative & Impression CLINICAL DATA:  Right hip pain.   EXAM: DG HIP (WITH OR WITHOUT PELVIS) 2-3V RIGHT   COMPARISON:  None Available.   FINDINGS: No acute fracture or dislocation. The bones are osteopenic. Cystic changes of the right femoral head and neck measure up to 3.7 cm. Orthopedic referral is advised. Mild arthritic changes of the right hip. Total left hip arthroplasty. The soft tissues are unremarkable. Vascular calcifications.   IMPRESSION: 1. No acute fracture or dislocation. 2. Cystic changes of the right femoral head and neck. Orthopedic referral is advised.  Lower Venous DVT Study   Patient Name:  Carl Mckeithan Sr.  Date of Exam:   06/10/2023  Medical Rec #: [id number]            Accession #:    [id number]  Date of Birth: 1960/08/15             Patient Gender: M  Patient Age:   39 years  Exam Location:  [facility]  Procedure:      VAS US  LOWER EXTREMITY VENOUS (DVT)  Referring Phys: PRENTICE COMPTON    ---------------------------------------------------------------------------  -----    Indications: Edema.    Risk Factors: Surgery Cervical corpectomy 05/26/23.  Comparison Study: No significant changes seen since previous exam 07/04/06.   Performing Technologist: Garnette Rockers     Examination Guidelines:  A complete evaluation includes B-mode imaging, spectral Doppler, color  Doppler,  and power Doppler as needed of all accessible portions of each vessel.  Bilateral  testing is considered an integral part of a complete examination. Limited  examinations for reoccurring indications may be performed as noted. The  reflux  portion of the exam is performed with the patient in reverse  Trendelenburg.       +---------+---------------+---------+-----------+----------+--------------+   RIGHT   CompressibilityPhasicitySpontaneityPropertiesThrombus  Aging  +---------+---------------+---------+-----------+----------+--------------+   CFV     Full           Yes      Yes                                   +---------+---------------+---------+-----------+----------+--------------+   SFJ     Full                                                          +---------+---------------+---------+-----------+----------+--------------+  FV Prox  Full                                                          +---------+---------------+---------+-----------+----------+--------------+   FV Mid   Full                                                          +---------+---------------+---------+-----------+----------+--------------+   FV DistalFull                                                          +---------+---------------+---------+-----------+----------+--------------+   PFV     Full                                                          +---------+---------------+---------+-----------+----------+--------------+   POP     Full           Yes      Yes                                   +---------+---------------+---------+-----------+----------+--------------+   PTV     Full                    Yes                                   +---------+---------------+---------+-----------+----------+--------------+   PERO    Full                    Yes                                   +---------+---------------+---------+-----------+----------+--------------+          +---------+---------------+---------+-----------+----------+--------------+   LEFT    CompressibilityPhasicitySpontaneityPropertiesThrombus  Aging  +---------+---------------+---------+-----------+----------+--------------+   CFV     Full            Yes      Yes                                   +---------+---------------+---------+-----------+----------+--------------+   SFJ     Full                                                          +---------+---------------+---------+-----------+----------+--------------+   FV Prox  Full                                                          +---------+---------------+---------+-----------+----------+--------------+  FV Mid   Full                                                          +---------+---------------+---------+-----------+----------+--------------+   FV DistalFull                                                          +---------+---------------+---------+-----------+----------+--------------+   PFV     Full                                                          +---------+---------------+---------+-----------+----------+--------------+   POP     Full           Yes      Yes                                   +---------+---------------+---------+-----------+----------+--------------+   PTV     Full                    Yes                                   +---------+---------------+---------+-----------+----------+--------------+   PERO    Full                    Yes                                   +---------+---------------+---------+-----------+----------+--------------+               Summary:  BILATERAL:  - No evidence of deep vein thrombosis seen in the lower extremities,  bilaterally.  -No evidence of popliteal cyst, bilaterally.    PATIENT SURVEYS:  PSFS: THE PATIENT SPECIFIC FUNCTIONAL SCALE  Place score of 0-10 (0 = unable to perform activity and 10 = able to perform activity at the same level as before injury or problem)  Activity Date: eval 09/12/23    Getting in and out of car (anything lower than 20 inches)  0    2. Balance  3    3. Walking without a device  2    4.   Social activities  0    Total Score 1.25      Total Score = Sum of activity scores/number of activities  Minimally Detectable Change: 3 points (for single activity); 2 points (for average score)  [employer] (nd). The Patient Specific Functional Scale . Retrieved from [email or web address]   COGNITION: Overall cognitive status: Within functional limits for tasks assessed     SENSATION: Hx of neuropathy even before fall/surgery     LOWER EXTREMITY MMT:  MMT Right eval Left eval  Hip flexion 3- 3+  Hip extension    Hip abduction  Hip adduction    Hip internal rotation    Hip external rotation    Knee flexion 3- 4+  Knee extension 3- 5  Ankle dorsiflexion 4 5  Ankle plantarflexion    Ankle inversion    Ankle eversion     (Blank rows = not tested)    GAIT: Distance walked: in clinic distances  Assistive device utilized: Environmental consultant - 2 wheeled Level of assistance: Modified independence Comments: slow but steady with RW                                                                                                                                TREATMENT DATE:   10/01/23 UBE L 3 2 min fwd and back NuStep L 5 x 6 min STS elevated mat 27.5  inches 2 sets 5 hands on knee, CGA SLS HS curls 10 x 15# RT, left 10x 20#, BIL 25# 10 x Knee ext 5# LLE 2 sets 5 RT knee LAQ 3 sets 5     09/25/23 NuStep L 5 x 6 min LAQ RLE no weight, LLE 3lb cuff 2x10  Seated match 3lb 2x10 HS curls green 2x15 Sit to stand form elevated surface 2x5 with UE use Side steps at mat  Standing march in [facility]  09/12/23  Eval, POC      PATIENT EDUCATION:  Education details: exam findings, POC Person educated: Patient Education method: Explanation Education comprehension: verbalized understanding, returned demonstration, and needs further education  HOME EXERCISE PROGRAM: TBD   ASSESSMENT:  CLINICAL IMPRESSION:  progressed strengthening. Assistance with STS from low surface. Practiced this from various heights with verb and tactile, pt fearful esp og RT knee lack of strength and buckling    Patient is a 63 y.o. M who was seen today for physical therapy evaluation and treatment for S14.109S (ICD-10-CM) - Spinal cord injury, cervical region, sequela (HCC) Pt tolerated a initial progression to therapy well. RLE is significantly weaker than R. Pt had log legs and a relatively short torso making it difficult to stand from low surfaces in conjunction with weak LE. Decrease coordination with side steps. No pain during session. Limited ROM with RLE during isolated exercises.  Will make every effort to improve function and work towards goals.   OBJECTIVE IMPAIRMENTS: Abnormal gait, decreased activity tolerance, decreased balance, decreased coordination, decreased knowledge of use of DME, decreased mobility, difficulty walking, decreased strength, postural dysfunction, and pain.   ACTIVITY LIMITATIONS: standing, squatting, stairs, transfers, bed mobility, toileting, dressing, and locomotion level  PARTICIPATION LIMITATIONS: shopping, community activity, and yard work  PERSONAL FACTORS: Age, Behavior pattern, Education, Fitness, Past/current experiences, Profession, Social background, and Time since onset of injury/illness/exacerbation are also affecting patient's functional outcome.   REHAB POTENTIAL: Good  CLINICAL DECISION MAKING: Evolving/moderate complexity  EVALUATION COMPLEXITY: Moderate   GOALS: Goals reviewed with patient? No  SHORT TERM GOALS: Target date: 10/24/2023   Will be compliant with appropriate progressive HEP  Baseline: Goal status: INITIAL  2.  Will be able to consistently perform functional STS tranfers from surfaces 20 inches or less  Baseline:  Goal status: INITIAL  3.  Will be able to ambulate at least 375ft with quad cane no more than light MinA  Baseline:  Goal status:  INITIAL  4.  MMT to be at least 3+/5 in all tested groups R LE  Baseline:  Goal status: INITIAL    LONG TERM GOALS: Target date: 12/05/2023      R LE MMT to be at least 4-/5 in all tested groups  Baseline:  Goal status: INITIAL  2.  Will score at least 18 on DGI with no device to show reduced fall risk  Baseline:  Goal status: INITIAL  3.  Will be able to reciprocally ascend/descend steps without difficulty and U rail  Baseline:  Goal status: INITIAL  4.  Will be able to ambulate community distances with LRAD, Mod(I) to allow for improved community access and participating in social activities  Baseline:  Goal status: INITIAL  5.  Will be compliant with appropriate gym based exercise program by time of DC  Baseline:  Goal status: INITIAL    PLAN:  PT FREQUENCY: 2x/week  PT DURATION: 12 weeks  PLANNED INTERVENTIONS: 97750- Physical Performance Testing, 97110-Therapeutic exercises, 97530- Therapeutic activity, [id number]- Neuromuscular re-education, 97535- Self Care, [id number]- Manual therapy, and 97116- Gait training  PLAN FOR NEXT SESSION: ISSUE HEP. Assess goals Jon Or PTA 10/01/23 10:17 AM

## 2023-10-03 ENCOUNTER — Ambulatory Visit

## 2023-10-03 ENCOUNTER — Ambulatory Visit: Admitting: Physical Therapy

## 2023-10-03 ENCOUNTER — Encounter: Payer: Self-pay | Admitting: Orthopaedic Surgery

## 2023-10-03 ENCOUNTER — Ambulatory Visit (INDEPENDENT_AMBULATORY_CARE_PROVIDER_SITE_OTHER): Admitting: Orthopaedic Surgery

## 2023-10-03 DIAGNOSIS — M1712 Unilateral primary osteoarthritis, left knee: Secondary | ICD-10-CM | POA: Diagnosis not present

## 2023-10-03 NOTE — Progress Notes (Signed)
 Office Visit Note   Patient: Carl Steinberger Sr.           Date of Birth: [birth year]           MRN: [id number] Visit Date: 10/03/2023              Requested by: [name], MD [address] [place],  [place] [address] PCP: [name], MD   Assessment & Plan: Visit Diagnoses:  1. Primary osteoarthritis of left knee     Plan: History of Present Illness Carl Cordts Sr. [name] is a 63 year old male with a history of spinal cord injury and dialysis who presents for evaluation of knee pain and consideration of knee replacement surgery.  He experiences ongoing issues with his left knee, describing it as 'acting funny' and possibly 'bone to bone.' He previously had fluid aspirated from both knees and received a Toradol  injection for pain management. He is considering knee replacement surgery and is aware of the potential impact of his dialysis treatment and physical condition on recovery.  He has a significant medical history of a spinal cord injury that resulted in paralysis and required neck surgery with plate placement. He has transitioned from a wheelchair to using a walker and occasionally a cane. He is currently undergoing physical therapy to improve mobility and strength.  He has been on dialysis for 15 years. He is recovering from a right hip replacement, which is progressing well. He experiences difficulty with mobility, such as getting in and out of his convertible car due to leg weakness, and is focused on regaining strength through physical therapy.   Left knee exam is unchanged from prior visit.  Assessment and Plan Left knee severe osteoarthritis - Continue physical therapy for general strengthening.  Patient is still recovering from surgery and spinal cord injury.  Would be best to wait until the patient is at maximum medical improvement before proceeding with knee replacement.  Total face to face encounter time was greater than 25 minutes and over  half of this time was spent in counseling and/or coordination of care.  Follow-Up Instructions: No follow-ups on file.   Orders:  No orders of the defined types were placed in this encounter.  No orders of the defined types were placed in this encounter.     Procedures: No procedures performed   Clinical Data: No additional findings.   Subjective: Chief Complaint  Patient presents with   Left Knee - Pain    HPI  Review of Systems  Constitutional: Negative.   HENT: Negative.    Eyes: Negative.   Respiratory: Negative.    Cardiovascular: Negative.   Gastrointestinal: Negative.   Endocrine: Negative.   Genitourinary: Negative.   Skin: Negative.   Allergic/Immunologic: Negative.   Neurological: Negative.   Hematological: Negative.   Psychiatric/Behavioral: Negative.    All other systems reviewed and are negative.    Objective: Vital Signs: There were no vitals taken for this visit.  Physical Exam Vitals and nursing note reviewed.  Constitutional:      Appearance: He is well-developed.  HENT:     Head: Normocephalic and atraumatic.  Eyes:     Pupils: Pupils are equal, round, and reactive to light.  Pulmonary:     Effort: Pulmonary effort is normal.  Abdominal:     Palpations: Abdomen is soft.  Musculoskeletal:        General: Normal range of motion.     Cervical back: Neck supple.  Skin:  General: Skin is warm.  Neurological:     Mental Status: He is alert and oriented to person, place, and time.  Psychiatric:        Behavior: Behavior normal.        Thought Content: Thought content normal.        Judgment: Judgment normal.     Ortho Exam  Specialty Comments:  No specialty comments available.  Imaging: No results found.   PMFS History: Patient Active Problem List   Diagnosis Date Noted   Primary osteoarthritis of left knee 10/03/2023   Right hip pain 07/14/2023   Coping style affecting medical condition 06/05/2023   Spinal cord  injury, cervical region, sequela (HCC) 05/30/2023   Cervical vertebral fracture (HCC) 05/25/2023   History of GI bleed 05/25/2023   C4 cervical fracture (HCC) 05/25/2023   Duodenitis    Gastritis and gastroduodenitis    Ischemic colitis (HCC)    Bright red blood per rectum    ABLA (acute blood loss anemia)    Gastric ulcer    Duodenal ulcer    Diverticulosis of colon without hemorrhage    GI bleed 09/17/2021   Acute upper GI bleeding 09/11/2021   Mucosal abnormality of esophagus    Mucosal abnormality of stomach    Mucosal abnormality of duodenum    Upper GI bleed    Left hip pain 11/16/2020   Type 2 diabetes mellitus with other specified complication (HCC) 11/14/2020   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/14/2020   Primary osteoarthritis of left hip 10/07/2020   Status post reverse total arthroplasty of right shoulder 08/24/2020   Diverticulitis 03/08/2020   ESRD (end stage renal disease) (HCC) 03/08/2020   Polyneuropathy in diseases classified elsewhere (HCC) 03/08/2020   Pure hypercholesterolemia 03/08/2020   Sacral back pain 03/08/2020   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 11/10/2019   Acute gastric ulcer with hemorrhage    Acute GI bleeding 07/31/2019   Hypotension of hemodialysis 07/31/2019   Diverticulitis large intestine 07/31/2019   Mass of soft tissue of shoulder 05/26/2019   Awaiting organ transplant status 03/18/2019   Finding of cocaine in blood 02/25/2019   Anaphylactic shock, unspecified, sequela 11/21/2018   Hypercalcemia 10/28/2018   Diarrhea, unspecified 08/08/2018   Hyperkalemia 03/12/2018   Renal cell carcinoma of left kidney (HCC) 12/24/2017   Fluid overload, unspecified 07/06/2017   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   Other specified disorders of kidney and ureter 11/22/2016   Secondary hyperparathyroidism of renal origin (HCC) 10/23/2016   Headache disorder 08/21/2016   TIA (transient ischemic attack)    Essential  hypertension    Seizure (HCC)    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    Lung nodule    Diverticulitis of cecum 07/21/2015   Diverticulitis of large intestine without perforation or abscess without bleeding    Right lower quadrant pain    Obstructive sleep apnea 02/08/2015   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   Diverticulosis 07/13/2014   Lower GI bleed 07/12/2014   Refusal of blood transfusions as patient is Jehovah's Witness    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   HLD (hyperlipidemia)    PRES (posterior reversible encephalopathy syndrome)    History of CVA (cerebrovascular accident) 05/23/2014   Anemia of chronic disease 05/23/2014   GERD (gastroesophageal reflux disease) 12/09/2012   Gastrointestinal bleeding 11/12/2012   Melena 11/12/2012   Other psychoactive substance dependence, uncomplicated (HCC) 10/08/2012  Iron deficiency anemia, unspecified 06/10/2012   Moderate protein-calorie malnutrition (HCC) 07/29/2009   Coagulation defect, unspecified (HCC) 08/29/2006   Type 2 diabetes mellitus with diabetic peripheral angiopathy without gangrene (HCC) 08/29/2006   Hypertensive chronic kidney disease with stage 5 chronic kidney disease or end stage renal disease (HCC) 08/02/2006   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    Parathyroid  abnormality (HCC)    ectopic parathyroid   gland   Presence of arteriovenous fistula for hemodialysis, primary (HCC)    RUE PER PT RLE   Refusal of blood product    NO WHOLE BLOOD PROUCTS   Renal cell carcinoma (HCC)    s/p hand assisted laparoscopic bilateral nephrectomies 11/29/17, + RCC left   Secondary hyperparathyroidism (HCC)    Seizures (HCC)    one episode in past, due to elevated Potassium 08/02/20- at least 4 years ago   Sleep apnea    doesn't use CPAP anymore since weight loss   Stroke (HCC)    no residual    Family History  Problem Relation Age of Onset   Diabetes Father    Stroke Father    Hypertension Father    Uterine cancer Mother    Lupus Sister    Stroke Sister    Hypertension Sister    Anuerysm Brother        brain   Colon cancer Neg Hx    Esophageal cancer Neg Hx    Stomach cancer Neg Hx    Pancreatic cancer Neg Hx    Liver disease Neg Hx    Colon polyps Neg Hx    Rectal cancer Neg Hx     Past Surgical History:  Procedure Laterality Date   ANTERIOR CERVICAL CORPECTOMY N/A 05/25/2023   Procedure: ANTERIOR CERVICAL CORPECTOMY CERVICAL FOUR, CERVICAL THREE - CERVICAL FIVE FUSION;  Surgeon: [name] Shove, MD;  Location: [facility] OR;  Service: Neurosurgery;  Laterality: N/A;   AV FISTULA PLACEMENT Right    right arm   BIOPSY  08/01/2019   Procedure: BIOPSY;  Surgeon: Kristie Lamprey, MD;  Location: [facility] ENDOSCOPY;  Service: Endoscopy;;   BIOPSY  11/18/2020   Procedure: BIOPSY;  Surgeon: Stacia Glendia BRAVO, MD;  Location: [facility] ENDOSCOPY;  Service: Gastroenterology;;   BIOPSY  09/13/2021   Procedure: BIOPSY;  Surgeon: Wilhelmenia Aloha Raddle., MD;  Location: [facility] ENDOSCOPY;  Service: Gastroenterology;;   CATARACT EXTRACTION W/ INTRAOCULAR LENS  IMPLANT, BILATERAL     COLON SURGERY     COLONOSCOPY N/A 08/04/2015   Procedure: COLONOSCOPY;  Surgeon: Gordy CHRISTELLA Starch, MD;  Location: [facility] ENDOSCOPY;  Service: Endoscopy;  Laterality: N/A;   COLONOSCOPY  2017   JMP@ [facility]-good prep-mass -recall 1 yr   COLONOSCOPY N/A 09/13/2021    Procedure: COLONOSCOPY;  Surgeon: Mansouraty, Aloha Raddle., MD;  Location:  [facility] ENDOSCOPY;  Service: Gastroenterology;  Laterality: N/A;   COLONOSCOPY WITH PROPOFOL  N/A 11/25/2020   Procedure: COLONOSCOPY WITH PROPOFOL ;  Surgeon: Federico Rosario BROCKS, MD;  Location: [facility] ENDOSCOPY;  Service: Gastroenterology;  Laterality: N/A;   COLONOSCOPY WITH PROPOFOL  N/A 09/23/2021   Procedure: COLONOSCOPY WITH PROPOFOL ;  Surgeon: San Sandor GAILS, DO;  Location: [facility] ENDOSCOPY;  Service: Gastroenterology;  Laterality: N/A;   ENTEROSCOPY N/A 09/23/2021   Procedure: ENTEROSCOPY;  Surgeon: San Sandor GAILS, DO;  Location: [facility] ENDOSCOPY;  Service: Gastroenterology;  Laterality: N/A;  Will place order for video capsule study as we may opt to place it during procedure   ESOPHAGOGASTRODUODENOSCOPY N/A 08/01/2019   Procedure: ESOPHAGOGASTRODUODENOSCOPY (EGD);  Surgeon: Kristie Lamprey, MD;  Location: [facility] ENDOSCOPY;  Service: Endoscopy;  Laterality: N/A;   ESOPHAGOGASTRODUODENOSCOPY N/A 11/18/2020   Procedure: ESOPHAGOGASTRODUODENOSCOPY (EGD);  Surgeon: Stacia Glendia BRAVO, MD;  Location: [facility] ENDOSCOPY;  Service: Gastroenterology;  Laterality: N/A;   ESOPHAGOGASTRODUODENOSCOPY (EGD) WITH PROPOFOL  N/A 08/04/2019   Procedure: ESOPHAGOGASTRODUODENOSCOPY (EGD) WITH PROPOFOL ;  Surgeon: Leigh Elspeth SQUIBB, MD;  Location: [facility] ENDOSCOPY;  Service: Gastroenterology;  Laterality: N/A;   ESOPHAGOGASTRODUODENOSCOPY (EGD) WITH PROPOFOL  N/A 09/13/2021   Procedure: ESOPHAGOGASTRODUODENOSCOPY (EGD) WITH PROPOFOL ;  Surgeon: Wilhelmenia Aloha Raddle., MD;  Location: [facility] ENDOSCOPY;  Service: Gastroenterology;  Laterality: N/A;   GIVENS CAPSULE STUDY N/A 09/23/2021   Procedure: GIVENS CAPSULE STUDY;  Surgeon: San Sandor GAILS, DO;  Location: [facility] ENDOSCOPY;  Service: Gastroenterology;  Laterality: N/A;   graft left arm Left    for dialysis x 2. Removed   HOT HEMOSTASIS  11/18/2020   Procedure: HOT HEMOSTASIS (ARGON PLASMA COAGULATION/BICAP);  Surgeon:  Stacia Glendia BRAVO, MD;  Location: [facility] ENDOSCOPY;  Service: Gastroenterology;;   HOT HEMOSTASIS N/A 09/23/2021   Procedure: HOT HEMOSTASIS (ARGON PLASMA COAGULATION/BICAP);  Surgeon: San Sandor GAILS, DO;  Location: [facility] ENDOSCOPY;  Service: Gastroenterology;  Laterality: N/A;   INSERTION OF DIALYSIS CATHETER     Rt chest   LAPAROSCOPIC RIGHT COLECTOMY N/A 08/05/2015   Procedure: LAPAROSCOPIC RIGHT COLECTOMY- ASCENDING;  Surgeon: Jina Nephew, MD;  Location: [facility] OR;  Service: General;  Laterality: N/A;   MASS EXCISION Left 05/28/2019   Procedure: EXCISION SOFT TISSUE MASS LEFT SHOULDER;  Surgeon: Eletha Boas, MD;  Location: [facility] ORS;  Service: General;  Laterality: Left;   NEPHRECTOMY Bilateral    PARATHYROIDECTOMY N/A 06/12/2016   Procedure: TOTAL PARATHYROIDECTOMY WITH AUTOTRANSPLANTATION TO LEFT FOREARM;  Surgeon: Boas Eletha, MD;  Location: [facility] OR;  Service: General;  Laterality: N/A;   PARATHYROIDECTOMY N/A 10/23/2016   Procedure: PARATHYROIDECTOMY;  Surgeon: Eletha Boas, MD;  Location[facility] OR;  Service: General;  Laterality: N/A;   REVERSE SHOULDER ARTHROPLASTY Right 08/24/2020   Procedure: REVERSE SHOULDER ARTHROPLASTY;  Surgeon: Cristy Bonner DASEN, MD;  Location: [facility] OR;  Service: Orthopedics;  Laterality: Right;   REVISON OF ARTERIOVENOUS FISTULA Right 07/16/2017   Procedure: REVISION OF ARTERIOVENOUS FISTULA  Right ARM;  Surgeon: Sheree Penne Bruckner, MD;  Location: [facility] OR;  Service: Vascular;  Laterality: Right;   TOTAL HIP ARTHROPLASTY Left 11/14/2020   Procedure: LEFT TOTAL HIP ARTHROPLASTY ANTERIOR APPROACH;  Surgeon: [name] Kay HERO, MD;  Location: [facility] OR;  Service: Orthopedics;  Laterality: Left;   UPPER GASTROINTESTINAL ENDOSCOPY  2021   @ [facility]   Social History   Occupational History   Occupation: retired    Associate Professor: SELF EMPLOYED  Tobacco Use   Smoking status: Former    Current packs/day: 0.00    Types: Cigarettes    Quit date:  06/19/1990    Years since quitting: 33.3    Smokeless tobacco: Never   Tobacco comments:    rarely cigar  Vaping Use   Vaping status: Never Used  Substance and Sexual Activity   Alcohol use: Not Currently    Alcohol/week: 1.0 standard drink of alcohol    Types: 1 Cans of beer per week    Comment: occasional beer   Drug use: Not Currently    Types: Marijuana    Comment: Last use several yrs ago   Sexual activity: Not Currently

## 2023-10-07 DIAGNOSIS — R197 Diarrhea, unspecified: Secondary | ICD-10-CM | POA: Diagnosis not present

## 2023-10-08 ENCOUNTER — Ambulatory Visit: Admitting: Physical Therapy

## 2023-10-08 DIAGNOSIS — M25611 Stiffness of right shoulder, not elsewhere classified: Secondary | ICD-10-CM | POA: Diagnosis not present

## 2023-10-08 DIAGNOSIS — M6281 Muscle weakness (generalized): Secondary | ICD-10-CM | POA: Diagnosis not present

## 2023-10-08 DIAGNOSIS — R296 Repeated falls: Secondary | ICD-10-CM | POA: Diagnosis not present

## 2023-10-08 DIAGNOSIS — R2681 Unsteadiness on feet: Secondary | ICD-10-CM | POA: Diagnosis not present

## 2023-10-08 DIAGNOSIS — M25511 Pain in right shoulder: Secondary | ICD-10-CM | POA: Diagnosis not present

## 2023-10-08 DIAGNOSIS — R262 Difficulty in walking, not elsewhere classified: Secondary | ICD-10-CM | POA: Diagnosis not present

## 2023-10-08 DIAGNOSIS — R2689 Other abnormalities of gait and mobility: Secondary | ICD-10-CM

## 2023-10-08 DIAGNOSIS — M25561 Pain in right knee: Secondary | ICD-10-CM | POA: Diagnosis not present

## 2023-10-08 DIAGNOSIS — G8929 Other chronic pain: Secondary | ICD-10-CM | POA: Diagnosis not present

## 2023-10-08 NOTE — Therapy (Signed)
 OUTPATIENT PHYSICAL THERAPY LOWER EXTREMITY TREATMENT   Patient Name: Carl Lacher Sr. MRN: [id number] DOB:12/02/1960, 63 y.o., male Today's Date: 10/08/2023  END OF SESSION:  PT End of Session - 10/08/23 0806     Visit Number 4    Number of Visits 25    Date for PT Re-Evaluation 12/05/23    Authorization Type [employer] Dual    Authorization Time Period 09/12/23 to 12/05/23    PT Start Time 0805    PT Stop Time 0845    PT Time Calculation (min) 40 min          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    Parathyroid  abnormality (HCC)    ectopic parathyroid  gland   Presence of arteriovenous fistula for hemodialysis, primary (HCC)    RUE PER PT RLE   Refusal of blood product    NO WHOLE BLOOD PROUCTS   Renal cell carcinoma (HCC)    s/p hand assisted laparoscopic bilateral nephrectomies 11/29/17, + RCC left   Secondary hyperparathyroidism (HCC)    Seizures (HCC)    one episode in past, due to elevated Potassium 08/02/20- at least 4 years ago   Sleep apnea    doesn't use CPAP anymore since weight loss   Stroke [facility])    no residual   Past Surgical History:  Procedure Laterality Date   ANTERIOR CERVICAL CORPECTOMY N/A 05/25/2023   Procedure: ANTERIOR CERVICAL CORPECTOMY CERVICAL FOUR, CERVICAL THREE - CERVICAL FIVE FUSION;  Surgeon: [name] Shove, MD;  Location: [facility] OR;  Service: Neurosurgery;  Laterality: N/A;   AV FISTULA PLACEMENT Right    right arm    BIOPSY  08/01/2019   Procedure: BIOPSY;  Surgeon: Kristie Lamprey, MD;  Location: [facility] ENDOSCOPY;  Service: Endoscopy;;   BIOPSY  11/18/2020   Procedure: BIOPSY;  Surgeon: Stacia Glendia BRAVO, MD;  Location: [facility] ENDOSCOPY;  Service: Gastroenterology;;   BIOPSY  09/13/2021   Procedure: BIOPSY;  Surgeon: Wilhelmenia Aloha Raddle., MD;  Location: [facility] ENDOSCOPY;  Service: Gastroenterology;;   CATARACT EXTRACTION W/ INTRAOCULAR LENS  IMPLANT, BILATERAL     COLON SURGERY     COLONOSCOPY N/A 08/04/2015   Procedure: COLONOSCOPY;  Surgeon: Gordy CHRISTELLA Starch, MD;  Location: [facility] ENDOSCOPY;  Service: Endoscopy;  Laterality: N/A;   COLONOSCOPY  2017   JMP@ [facility]-good prep-mass -recall 1 yr   COLONOSCOPY N/A 09/13/2021   Procedure: COLONOSCOPY;  Surgeon: Mansouraty, Aloha Raddle., MD;  Location: [facility] ENDOSCOPY;  Service: Gastroenterology;  Laterality: N/A;   COLONOSCOPY WITH PROPOFOL  N/A 11/25/2020   Procedure: COLONOSCOPY WITH PROPOFOL ;  Surgeon: Federico Rosario BROCKS, MD;  Location: [facility] ENDOSCOPY;  Service: Gastroenterology;  Laterality: N/A;   COLONOSCOPY WITH PROPOFOL  N/A 09/23/2021   Procedure: COLONOSCOPY WITH PROPOFOL ;  Surgeon: San Sandor GAILS, DO;  Location: [facility] ENDOSCOPY;  Service: Gastroenterology;  Laterality: N/A;   ENTEROSCOPY N/A 09/23/2021   Procedure: ENTEROSCOPY;  Surgeon: San Sandor GAILS, DO;  Location: [facility] ENDOSCOPY;  Service: Gastroenterology;  Laterality: N/A;  Will place order for video capsule study as we may opt to place it during procedure   ESOPHAGOGASTRODUODENOSCOPY N/A 08/01/2019   Procedure: ESOPHAGOGASTRODUODENOSCOPY (EGD);  Surgeon: Kristie Lamprey, MD;  Location: [facility] ENDOSCOPY;  Service: Endoscopy;  Laterality: N/A;   ESOPHAGOGASTRODUODENOSCOPY N/A 11/18/2020   Procedure: ESOPHAGOGASTRODUODENOSCOPY (EGD);  Surgeon: Stacia Glendia BRAVO, MD;  Location: [facility] ENDOSCOPY;  Service: Gastroenterology;  Laterality: N/A;   ESOPHAGOGASTRODUODENOSCOPY (EGD) WITH PROPOFOL  N/A 08/04/2019   Procedure:  ESOPHAGOGASTRODUODENOSCOPY (EGD) WITH PROPOFOL ;  Surgeon: Leigh Elspeth SQUIBB, MD;  Location: [facility] ENDOSCOPY;  Service: Gastroenterology;  Laterality: N/A;   ESOPHAGOGASTRODUODENOSCOPY (EGD) WITH PROPOFOL  N/A 09/13/2021   Procedure: ESOPHAGOGASTRODUODENOSCOPY (EGD) WITH PROPOFOL ;  Surgeon: Wilhelmenia Aloha Raddle., MD;  Location: [facility] ENDOSCOPY;  Service: Gastroenterology;  Laterality: N/A;   GIVENS CAPSULE STUDY N/A 09/23/2021   Procedure: GIVENS CAPSULE STUDY;  Surgeon: San Sandor GAILS, DO;  Location: [facility] ENDOSCOPY;  Service: Gastroenterology;  Laterality: N/A;   graft left arm Left    for dialysis x 2. Removed   HOT HEMOSTASIS  11/18/2020   Procedure: HOT HEMOSTASIS (ARGON PLASMA COAGULATION/BICAP);  Surgeon: Stacia Glendia BRAVO, MD;  Location: [facility] ENDOSCOPY;  Service: Gastroenterology;;   HOT HEMOSTASIS N/A 09/23/2021   Procedure: HOT HEMOSTASIS (ARGON PLASMA COAGULATION/BICAP);  Surgeon: San Sandor GAILS, DO;  Location: [facility] ENDOSCOPY;  Service: Gastroenterology;  Laterality: N/A;   INSERTION OF DIALYSIS CATHETER     Rt chest   LAPAROSCOPIC RIGHT COLECTOMY N/A 08/05/2015   Procedure: LAPAROSCOPIC RIGHT COLECTOMY- ASCENDING;  Surgeon: Jina Nephew, MD;  Location: [facility] OR;  Service: General;  Laterality: N/A;   MASS EXCISION Left 05/28/2019   Procedure: EXCISION SOFT TISSUE MASS LEFT SHOULDER;  Surgeon: Eletha Boas, MD;  Location: [facility] ORS;  Service: General;  Laterality: Left;   NEPHRECTOMY Bilateral    PARATHYROIDECTOMY N/A 06/12/2016   Procedure: TOTAL PARATHYROIDECTOMY WITH AUTOTRANSPLANTATION TO LEFT FOREARM;  Surgeon: Boas Eletha, MD;  Location: [facility] OR;  Service: General;  Laterality: N/A;   PARATHYROIDECTOMY N/A 10/23/2016   Procedure: PARATHYROIDECTOMY;  Surgeon: Eletha Boas, MD;  Location: [facility] OR;  Service: General;  Laterality: N/A;   REVERSE SHOULDER ARTHROPLASTY Right 08/24/2020   Procedure: REVERSE SHOULDER ARTHROPLASTY;  Surgeon: Cristy Bonner DASEN, MD;  Location: [facility] OR;  Service: Orthopedics;   Laterality: Right;   REVISON OF ARTERIOVENOUS FISTULA Right 07/16/2017   Procedure: REVISION OF ARTERIOVENOUS FISTULA  Right ARM;  Surgeon: Sheree Penne Bruckner, MD;  Location: [facility] OR;  Service: Vascular;  Laterality: Right;   TOTAL HIP ARTHROPLASTY Left 11/14/2020   Procedure: LEFT TOTAL HIP ARTHROPLASTY ANTERIOR APPROACH;  Surgeon: [name] Kay HERO, MD;  Location: [facility] OR;  Service: Orthopedics;  Laterality: Left;   UPPER GASTROINTESTINAL ENDOSCOPY  2021   @ [facility]   Patient Active Problem List   Diagnosis Date Noted   Primary osteoarthritis of left knee 10/03/2023   Right hip pain 07/14/2023   Coping style affecting medical condition 06/05/2023   Spinal cord injury, cervical region, sequela (HCC) 05/30/2023   Cervical vertebral fracture (HCC) 05/25/2023   History of GI bleed 05/25/2023   C4 cervical fracture (HCC) 05/25/2023   Duodenitis    Gastritis and gastroduodenitis    Ischemic colitis (HCC)    Bright red blood per rectum    ABLA (acute blood loss anemia)    Gastric ulcer  Duodenal ulcer    Diverticulosis of colon without hemorrhage    GI bleed 09/17/2021   Acute upper GI bleeding 09/11/2021   Mucosal abnormality of esophagus    Mucosal abnormality of stomach    Mucosal abnormality of duodenum    Upper GI bleed    Left hip pain 11/16/2020   Type 2 diabetes mellitus with other specified complication (HCC) 11/14/2020   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/14/2020   Primary osteoarthritis of left hip 10/07/2020   Status post reverse total arthroplasty of right shoulder 08/24/2020   Diverticulitis 03/08/2020   ESRD (end stage renal disease) (HCC) 03/08/2020   Polyneuropathy in diseases classified elsewhere (HCC) 03/08/2020   Pure hypercholesterolemia 03/08/2020   Sacral back pain 03/08/2020   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 11/10/2019   Acute gastric ulcer with hemorrhage    Acute GI bleeding 07/31/2019   Hypotension of hemodialysis 07/31/2019    Diverticulitis large intestine 07/31/2019   Mass of soft tissue of shoulder 05/26/2019   Awaiting organ transplant status 03/18/2019   Finding of cocaine in blood 02/25/2019   Anaphylactic shock, unspecified, sequela 11/21/2018   Hypercalcemia 10/28/2018   Diarrhea, unspecified 08/08/2018   Hyperkalemia 03/12/2018   Renal cell carcinoma of left kidney (HCC) 12/24/2017   Fluid overload, unspecified 07/06/2017   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   Other specified disorders of kidney and ureter 11/22/2016   Secondary hyperparathyroidism of renal origin (HCC) 10/23/2016   Headache disorder 08/21/2016   TIA (transient ischemic attack)    Essential hypertension    Seizure (HCC)    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    Lung nodule    Diverticulitis of cecum 07/21/2015   Diverticulitis of large intestine without perforation or abscess without bleeding    Right lower quadrant pain    Obstructive sleep apnea 02/08/2015   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   Diverticulosis 07/13/2014   Lower GI bleed 07/12/2014   Refusal of blood transfusions as patient is Jehovah's Witness    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   HLD (hyperlipidemia)    PRES (posterior reversible encephalopathy syndrome)    History of CVA (cerebrovascular accident) 05/23/2014   Anemia of chronic disease 05/23/2014   GERD (gastroesophageal reflux disease) 12/09/2012   Gastrointestinal bleeding 11/12/2012   Melena 11/12/2012   Other psychoactive substance dependence, uncomplicated (HCC) 10/08/2012   Iron deficiency anemia, unspecified 06/10/2012   Moderate protein-calorie malnutrition (HCC) 07/29/2009   Coagulation defect, unspecified (HCC) 08/29/2006   Type 2 diabetes mellitus with diabetic peripheral angiopathy without gangrene (HCC) 08/29/2006   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 No falls  Had surgery in April after a fall- the doctors said that my cervical cord was narrowing anyway, they discovered the extent after I came in to be seen after the fall. Ended up in rehab in [place] for 30 days. Went from [facility] to  walker, then went to [place] for more rehab after I DCed from [place]. Can't get in my convertible no more, its too short. R side is still weaker than the L. Have had a couple of falls since getting out of the hospital.   PERTINENT HISTORY: See above   ACDF C3-C5 fusion, anterior cervical corpectomy C3-C4  PAIN:  Are you having pain? Pain is off and on, R hip may need surgery in about 6 months, knees are bone on bone, no number/10 given   PRECAUTIONS:  Precautions:  Precautions Precautions: Fall, Cervical, Other (comment) Recall of Precautions/Restrictions: Impaired Precaution/Restrictions Comments: Verbally reviewed cervical precautions. Required Braces or Orthoses: Cervical Brace Cervical Brace: Soft collar, At all times Restrictions Weight Bearing Restrictions Per Provider Order: No General: Pain: No c/o pain.   RED FLAGS: None   WEIGHT BEARING RESTRICTIONS: No  FALLS:  Has patient fallen in last 6 months? Yes. Number of falls 3- one that led to surgery, then 2 after getting home from the hospital  LIVING ENVIRONMENT: Lives with: lives with their son Lives in: House/apartment Stairs: ground floor apt  Has following  equipment at home: Single point cane, Environmental consultant - 2 wheeled, and Environmental consultant - 4 wheeled  OCCUPATION: retired- used to pressure wash   PLOF: Independent, Independent with basic ADLs, Independent with gait, and Independent with transfers  PATIENT GOALS: get back to normal ability, drive convertible again   NEXT MD VISIT: Dr. Cornelio 11/15/23  OBJECTIVE:  Note: Objective measures were completed at Evaluation unless otherwise noted.  DIAGNOSTIC FINDINGS:    CLINICAL DATA:  63 year old male with altered mental status.   EXAM: CT HEAD WITHOUT CONTRAST   TECHNIQUE: Contiguous axial images were obtained from the base of the skull through the vertex without intravenous contrast.   RADIATION DOSE REDUCTION: This exam was performed according to the departmental dose-optimization program which includes automated exposure control, adjustment of the mA and/or kV according to patient size and/or use of iterative reconstruction technique.   COMPARISON:  Brain MRI and head CT 05/25/2023.   FINDINGS: Brain:   Unusual extensive, diffuse pachymeningeal calcification which has been present since at least 2013. Stable cerebral volume. No midline shift, ventriculomegaly, mass effect, evidence of mass lesion, intracranial hemorrhage or evidence of cortically based acute infarction. Small area of chronic encephalomalacia in the medial left occipital lobe redemonstrated. Stable gray-white matter differentiation throughout the brain.   Vascular: Chronic severe pachymeningeal calcification.   Skull: Appears stable.  No acute osseous abnormality identified.   Sinuses/Orbits: Continued left middle ear and mastoid opacification. Other Visualized paranasal sinuses and mastoids are stable and well aerated.   Other: Anterior left scalp soft tissue injury has regressed since earlier in the month. Orbits soft tissues appears stable.   IMPRESSION: 1. No acute intracranial abnormality. 2. Advanced chronic  pachymeningeal calcification, etiology unclear. Small chronic left PCA territory infarct. 3. Ongoing left middle ear and mastoid opacification. No acute osseous abnormality identified. 4. Regressed scalp soft tissue injury since 05/25/2023.  CLINICAL DATA:  Chronic bilateral knee pain and swelling.   EXAM: RIGHT KNEE - COMPLETE 4+ VIEW; LEFT KNEE - COMPLETE 4+ VIEW   COMPARISON:  Right knee radiographs dated 12/26/2021.   FINDINGS: Right knee:   No acute fracture or dislocation. There is a small to moderate-sized joint effusion. Moderate tricompartmental degenerative changes with joint space narrowing and marginal osteophytosis. Vascular calcifications are noted.   Left knee:   No acute fracture or dislocation. There is a small joint effusion.  Moderate tricompartmental degenerative changes with joint space narrowing and marginal osteophytosis. Vascular calcifications are noted.   IMPRESSION: 1. No acute osseous abnormality. 2. 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  Narrative & Impression  CLINICAL DATA:  Chronic bilateral knee pain and swelling.   EXAM: RIGHT KNEE - COMPLETE 4+ VIEW; LEFT KNEE - COMPLETE 4+ VIEW   COMPARISON:  Right knee radiographs dated 12/26/2021.   FINDINGS: Right knee:   No acute fracture or dislocation. There is a small to moderate-sized joint effusion. Moderate tricompartmental degenerative changes with joint space narrowing and marginal osteophytosis. Vascular calcifications are noted.   Left knee:   No acute fracture or dislocation. There is a small joint effusion. Moderate tricompartmental degenerative changes with joint space narrowing and marginal osteophytosis. Vascular calcifications are noted.   IMPRESSION: 1. No acute osseous abnormality. 2. Moderate tricompartmental degenerative changes of the bilateral knees with small to moderate-sized knee  joint effusions.    Narrative & Impression CLINICAL DATA:  Right hip pain.   EXAM: DG HIP (WITH OR WITHOUT PELVIS) 2-3V RIGHT   COMPARISON:  None Available.   FINDINGS: No acute fracture or dislocation. The bones are osteopenic. Cystic changes of the right femoral head and neck measure up to 3.7 cm. Orthopedic referral is advised. Mild arthritic changes of the right hip. Total left hip arthroplasty. The soft tissues are unremarkable. Vascular calcifications.   IMPRESSION: 1. No acute fracture or dislocation. 2. Cystic changes of the right femoral head and neck. Orthopedic referral is advised.  Lower Venous DVT Study   Patient Name:  Gerell Fortson Sr.  Date of Exam:   06/10/2023  Medical Rec #: [id number]            Accession #:    [id number]  Date of Birth: 06-Jun-1960             Patient Gender: M  Patient Age:   33 years  Exam [facility]  [facility]  Procedure:      VAS US  LOWER EXTREMITY VENOUS (DVT)  Referring Phys: PRENTICE COMPTON    ---------------------------------------------------------------------------  -----    Indications: Edema.    Risk Factors: Surgery Cervical corpectomy 05/26/23.  Comparison Study: No significant changes seen since previous exam 07/04/06.   Performing Technologist: Garnette Rockers     Examination Guidelines:  A complete evaluation includes B-mode imaging, spectral Doppler, color  Doppler,  and power Doppler as needed of all accessible portions of each vessel.  Bilateral  testing is considered an integral part of a complete examination. Limited  examinations for reoccurring indications may be performed as noted. The  reflux  portion of the exam is performed with the patient in reverse  Trendelenburg.      +---------+---------------+---------+-----------+----------+--------------+   RIGHT   CompressibilityPhasicitySpontaneityPropertiesThrombus  Aging   +---------+---------------+---------+-----------+----------+--------------+   CFV     Full           Yes      Yes                                   +---------+---------------+---------+-----------+----------+--------------+   SFJ     Full                                                          +---------+---------------+---------+-----------+----------+--------------+  FV Prox  Full                                                          +---------+---------------+---------+-----------+----------+--------------+   FV Mid   Full                                                          +---------+---------------+---------+-----------+----------+--------------+   FV DistalFull                                                          +---------+---------------+---------+-----------+----------+--------------+   PFV     Full                                                          +---------+---------------+---------+-----------+----------+--------------+   POP     Full           Yes      Yes                                   +---------+---------------+---------+-----------+----------+--------------+   PTV     Full                    Yes                                   +---------+---------------+---------+-----------+----------+--------------+   PERO    Full                    Yes                                   +---------+---------------+---------+-----------+----------+--------------+          +---------+---------------+---------+-----------+----------+--------------+   LEFT    CompressibilityPhasicitySpontaneityPropertiesThrombus  Aging  +---------+---------------+---------+-----------+----------+--------------+   CFV     Full           Yes      Yes                                   +---------+---------------+---------+-----------+----------+--------------+   SFJ     Full                                                           +---------+---------------+---------+-----------+----------+--------------+   FV Prox  Full                                                          +---------+---------------+---------+-----------+----------+--------------+  FV Mid   Full                                                          +---------+---------------+---------+-----------+----------+--------------+   FV DistalFull                                                          +---------+---------------+---------+-----------+----------+--------------+   PFV     Full                                                          +---------+---------------+---------+-----------+----------+--------------+   POP     Full           Yes      Yes                                   +---------+---------------+---------+-----------+----------+--------------+   PTV     Full                    Yes                                   +---------+---------------+---------+-----------+----------+--------------+   PERO    Full                    Yes                                   +---------+---------------+---------+-----------+----------+--------------+               Summary:  BILATERAL:  - No evidence of deep vein thrombosis seen in the lower extremities,  bilaterally.  -No evidence of popliteal cyst, bilaterally.    PATIENT SURVEYS:  PSFS: THE PATIENT SPECIFIC FUNCTIONAL SCALE  Place score of 0-10 (0 = unable to perform activity and 10 = able to perform activity at the same level as before injury or problem)  Activity Date: eval 09/12/23    Getting in and out of car (anything lower than 20 inches)  0    2. Balance  3    3. Walking without a device  2    4.  Social activities  0    Total Score 1.25      Total Score = Sum of activity scores/number of activities  Minimally Detectable Change: 3 points (for single  activity); 2 points (for average score)  [facility] (nd). The Patient Specific Functional Scale . Retrieved from [email or web address]   COGNITION: Overall cognitive status: Within functional limits for tasks assessed     SENSATION: Hx of neuropathy even before fall/surgery     LOWER EXTREMITY MMT:  MMT Right eval Left eval  Hip flexion 3- 3+  Hip extension    Hip abduction  Hip adduction    Hip internal rotation    Hip external rotation    Knee flexion 3- 4+  Knee extension 3- 5  Ankle dorsiflexion 4 5  Ankle plantarflexion    Ankle inversion    Ankle eversion     (Blank rows = not tested)    GAIT: Distance walked: in clinic distances  Assistive device utilized: Environmental consultant - 2 wheeled Level of assistance: Modified independence Comments: slow but steady with RW                                                                                                                                TREATMENT DATE:   10/08/23 Nustep L 5 Green tband HS curl 2 sets 10 2# LAQ 2 sets 10- quad lag BIL RT > left Standing with RW 2# marching,SL hip flex,ext and abd 20 x alt STS elevated mat 27.5  inches 2 sets 5 hands on knee, min A Amb with SPC CGA 100 feet with 2 slight sways but reqained Step up 4 in with RW 10 x BIL Step up 4 in with airex min A on RT 2 sets 5, left 10 x      10/01/23 UBE L 3 2 min fwd and back NuStep L 5 x 6 min STS elevated mat 27.5  inches 2 sets 5 hands on knee, CGA SLS HS curls 10 x 15# RT, left 10x 20#, BIL 25# 10 x Knee ext 5# LLE 2 sets 5 RT knee LAQ 3 sets 5     09/25/23 NuStep L 5 x 6 min LAQ RLE no weight, LLE 3lb cuff 2x10  Seated match 3lb 2x10 HS curls green 2x15 Sit to stand form elevated surface 2x5 with UE use Side steps at mat  Standing march in [facility]  09/12/23  Eval, POC      PATIENT EDUCATION:  Education details: exam findings, POC Person educated:  Patient Education method: Explanation Education comprehension: verbalized understanding, returned demonstration, and needs further education  HOME EXERCISE PROGRAM: TBD   ASSESSMENT:  CLINICAL IMPRESSION: pt states walking some with spc at home and at times nothing,using walls- cautioned pt about fall risk/prevention and he VU. Progressed strengthening( assist and cuing needed)- quad lag and struggles with STS even form elevated surface. Goals assessed.   Patient is a 63 y.o. M who was seen today for physical therapy evaluation and treatment for S14.109S (ICD-10-CM) - Spinal cord injury, cervical region, sequela (HCC) Pt tolerated a initial progression to therapy well. RLE is significantly weaker than R. Pt had log legs and a relatively short torso making it difficult to stand from low surfaces in conjunction with weak LE. Decrease coordination with side steps. No pain during session. Limited ROM with RLE during isolated exercises.  Will make every effort to improve function and work towards goals.   OBJECTIVE IMPAIRMENTS: Abnormal gait, decreased activity tolerance, decreased balance, decreased coordination, decreased knowledge of use  of DME, decreased mobility, difficulty walking, decreased strength, postural dysfunction, and pain.   ACTIVITY LIMITATIONS: standing, squatting, stairs, transfers, bed mobility, toileting, dressing, and locomotion level  PARTICIPATION LIMITATIONS: shopping, community activity, and yard work  PERSONAL FACTORS: Age, Behavior pattern, Education, Fitness, Past/current experiences, Profession, Social background, and Time since onset of injury/illness/exacerbation are also affecting patient's functional outcome.   REHAB POTENTIAL: Good  CLINICAL DECISION MAKING: Evolving/moderate complexity  EVALUATION COMPLEXITY: Moderate   GOALS: Goals reviewed with patient? No  SHORT TERM GOALS: Target date: 10/24/2023   Will be compliant with appropriate progressive HEP   Baseline: Goal status: 10/08/23 MET  2.  Will be able to consistently perform functional STS tranfers from surfaces 20 inches or less  Baseline:  Goal status:10/08/23 on going  3.  Will be able to ambulate at least 371ft with quad cane no more than light MinA  Baseline:  Goal status: 10/08/23 progressing  4.  MMT to be at least 3+/5 in all tested groups R LE  Baseline:  Goal status: INITIAL    LONG TERM GOALS: Target date: 12/05/2023      R LE MMT to be at least 4-/5 in all tested groups  Baseline:  Goal status: INITIAL  2.  Will score at least 18 on DGI with no device to show reduced fall risk  Baseline:  Goal status: INITIAL  3.  Will be able to reciprocally ascend/descend steps without difficulty and U rail  Baseline:  Goal status: INITIAL  4.  Will be able to ambulate community distances with LRAD, Mod(I) to allow for improved community access and participating in social activities  Baseline:  Goal status: INITIAL  5.  Will be compliant with appropriate gym based exercise program by time of DC  Baseline:  Goal status: INITIAL    PLAN:  PT FREQUENCY: 2x/week  PT DURATION: 12 weeks  PLANNED INTERVENTIONS: 97750- Physical Performance Testing, 97110-Therapeutic exercises, 97530- Therapeutic activity, [id number]- Neuromuscular re-education, 97535- Self Care, [id number]- Manual therapy, and 97116- Gait training  PLAN FOR NEXT SESSION: progress HEP- currently mosty walking and doing ex from rehab Jon Rubye Strohmeyer PTA 10/08/23 8:07 AM

## 2023-10-09 DIAGNOSIS — S12390D Other displaced fracture of fourth cervical vertebra, subsequent encounter for fracture with routine healing: Secondary | ICD-10-CM | POA: Diagnosis not present

## 2023-10-10 ENCOUNTER — Ambulatory Visit: Admitting: Physical Therapy

## 2023-10-10 DIAGNOSIS — R296 Repeated falls: Secondary | ICD-10-CM | POA: Diagnosis not present

## 2023-10-10 DIAGNOSIS — R2681 Unsteadiness on feet: Secondary | ICD-10-CM | POA: Diagnosis not present

## 2023-10-10 DIAGNOSIS — R2689 Other abnormalities of gait and mobility: Secondary | ICD-10-CM | POA: Diagnosis not present

## 2023-10-10 DIAGNOSIS — R262 Difficulty in walking, not elsewhere classified: Secondary | ICD-10-CM

## 2023-10-10 DIAGNOSIS — M6281 Muscle weakness (generalized): Secondary | ICD-10-CM

## 2023-10-10 DIAGNOSIS — G8929 Other chronic pain: Secondary | ICD-10-CM | POA: Diagnosis not present

## 2023-10-10 DIAGNOSIS — M25511 Pain in right shoulder: Secondary | ICD-10-CM | POA: Diagnosis not present

## 2023-10-10 DIAGNOSIS — M25611 Stiffness of right shoulder, not elsewhere classified: Secondary | ICD-10-CM | POA: Diagnosis not present

## 2023-10-10 DIAGNOSIS — M25561 Pain in right knee: Secondary | ICD-10-CM | POA: Diagnosis not present

## 2023-10-10 NOTE — Therapy (Signed)
 OUTPATIENT PHYSICAL THERAPY LOWER EXTREMITY TREATMENT   Patient Name: Carl Wacker Sr. MRN: [id number] DOB:11-Jun-1960, 63 y.o., male Today's Date: 10/10/2023  END OF SESSION:  PT End of Session - 10/10/23 0802     Visit Number 5    Number of Visits 25    Date for PT Re-Evaluation 12/05/23    Authorization Type U[employer] Dual    Authorization Time Period 09/12/23 to 12/05/23    PT Start Time 0800    PT Stop Time 0845    PT Time Calculation (min) 45 min          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    Parathyroid  abnormality (HCC)    ectopic parathyroid  gland   Presence of arteriovenous fistula for hemodialysis, primary (HCC)    RUE PER PT RLE   Refusal of blood product    NO WHOLE BLOOD PROUCTS   Renal cell carcinoma (HCC)    s/p hand assisted laparoscopic bilateral nephrectomies 11/29/17, + RCC left   Secondary hyperparathyroidism (HCC)    Seizures (HCC)    one episode in past, due to elevated Potassium 08/02/20- at least 4 years ago   Sleep apnea    doesn't use CPAP anymore since weight loss   Stroke [facility])    no residual   Past Surgical History:  Procedure Laterality Date   ANTERIOR CERVICAL CORPECTOMY N/A 05/25/2023   Procedure: ANTERIOR CERVICAL CORPECTOMY CERVICAL FOUR, CERVICAL THREE - CERVICAL FIVE FUSION;  Surgeon: [name] Shove, MD;  Location: [facility] OR;  Service: Neurosurgery;  Laterality: N/A;   AV FISTULA PLACEMENT Right    right arm    BIOPSY  08/01/2019   Procedure: BIOPSY;  Surgeon: Kristie Lamprey, MD;  Location: [facility] ENDOSCOPY;  Service: Endoscopy;;   BIOPSY  11/18/2020   Procedure: BIOPSY;  Surgeon: Stacia Glendia BRAVO, MD;  Location: [facility] ENDOSCOPY;  Service: Gastroenterology;;   BIOPSY  09/13/2021   Procedure: BIOPSY;  Surgeon: Wilhelmenia Aloha Raddle., MD;  Location: [facility] ENDOSCOPY;  Service: Gastroenterology;;   CATARACT EXTRACTION W/ INTRAOCULAR LENS  IMPLANT, BILATERAL     COLON SURGERY     COLONOSCOPY N/A 08/04/2015   Procedure: COLONOSCOPY;  Surgeon: Gordy CHRISTELLA Starch, MD;  Location: [facility] ENDOSCOPY;  Service: Endoscopy;  Laterality: N/A;   COLONOSCOPY  2017   JMP@ [facility]-good prep-mass -recall 1 yr   COLONOSCOPY N/A 09/13/2021   Procedure: COLONOSCOPY;  Surgeon: Mansouraty, Aloha Raddle., MD;  Location: [facility] ENDOSCOPY;  Service: Gastroenterology;  Laterality: N/A;   COLONOSCOPY WITH PROPOFOL  N/A 11/25/2020   Procedure: COLONOSCOPY WITH PROPOFOL ;  Surgeon: Federico Rosario BROCKS, MD;  Location: [facility] ENDOSCOPY;  Service: Gastroenterology;  Laterality: N/A;   COLONOSCOPY WITH PROPOFOL  N/A 09/23/2021   Procedure: COLONOSCOPY WITH PROPOFOL ;  Surgeon: San Sandor GAILS, DO;  Location: [facility] ENDOSCOPY;  Service: Gastroenterology;  Laterality: N/A;   ENTEROSCOPY N/A 09/23/2021   Procedure: ENTEROSCOPY;  Surgeon: San Sandor GAILS, DO;  Location: [facility] ENDOSCOPY;  Service: Gastroenterology;  Laterality: N/A;  Will place order for video capsule study as we may opt to place it during procedure   ESOPHAGOGASTRODUODENOSCOPY N/A 08/01/2019   Procedure: ESOPHAGOGASTRODUODENOSCOPY (EGD);  Surgeon: Kristie Lamprey, MD;  Location: [facility] ENDOSCOPY;  Service: Endoscopy;  Laterality: N/A;   ESOPHAGOGASTRODUODENOSCOPY N/A 11/18/2020   Procedure: ESOPHAGOGASTRODUODENOSCOPY (EGD);  Surgeon: Stacia Glendia BRAVO, MD;  Location: [facility] ENDOSCOPY;  Service: Gastroenterology;  Laterality: N/A;   ESOPHAGOGASTRODUODENOSCOPY (EGD) WITH PROPOFOL  N/A 08/04/2019   Procedure:  ESOPHAGOGASTRODUODENOSCOPY (EGD) WITH PROPOFOL ;  Surgeon: Leigh Elspeth SQUIBB, MD;  Location: [facility] ENDOSCOPY;  Service: Gastroenterology;  Laterality: N/A;   ESOPHAGOGASTRODUODENOSCOPY (EGD) WITH PROPOFOL  N/A 09/13/2021   Procedure: ESOPHAGOGASTRODUODENOSCOPY (EGD) WITH PROPOFOL ;  Surgeon: Wilhelmenia Aloha Raddle., MD;  Location: [facility] ENDOSCOPY;  Service: Gastroenterology;  Laterality: N/A;   GIVENS CAPSULE STUDY N/A 09/23/2021   Procedure: GIVENS CAPSULE STUDY;  Surgeon: San Sandor GAILS, DO;  Location: [facility] ENDOSCOPY;  Service: Gastroenterology;  Laterality: N/A;   graft left arm Left    for dialysis x 2. Removed   HOT HEMOSTASIS  11/18/2020   Procedure: HOT HEMOSTASIS (ARGON PLASMA COAGULATION/BICAP);  Surgeon: Stacia Glendia BRAVO, MD;  Location: [facility] ENDOSCOPY;  Service: Gastroenterology;;   HOT HEMOSTASIS N/A 09/23/2021   Procedure: HOT HEMOSTASIS (ARGON PLASMA COAGULATION/BICAP);  Surgeon: San Sandor GAILS, DO;  Location: [facility] ENDOSCOPY;  Service: Gastroenterology;  Laterality: N/A;   INSERTION OF DIALYSIS CATHETER     Rt chest   LAPAROSCOPIC RIGHT COLECTOMY N/A 08/05/2015   Procedure: LAPAROSCOPIC RIGHT COLECTOMY- ASCENDING;  Surgeon: Jina Nephew, MD;  Location: [facility] OR;  Service: General;  Laterality: N/A;   MASS EXCISION Left 05/28/2019   Procedure: EXCISION SOFT TISSUE MASS LEFT SHOULDER;  Surgeon: Eletha Boas, MD;  Location: [facility] ORS;  Service: General;  Laterality: Left;   NEPHRECTOMY Bilateral    PARATHYROIDECTOMY N/A 06/12/2016   Procedure: TOTAL PARATHYROIDECTOMY WITH AUTOTRANSPLANTATION TO LEFT FOREARM;  Surgeon: Boas Eletha, MD;  Location: [facility] OR;  Service: General;  Laterality: N/A;   PARATHYROIDECTOMY N/A 10/23/2016   Procedure: PARATHYROIDECTOMY;  Surgeon: Eletha Boas, MD;  Location: [facility] OR;  Service: General;  Laterality: N/A;   REVERSE SHOULDER ARTHROPLASTY Right 08/24/2020   Procedure: REVERSE SHOULDER ARTHROPLASTY;  Surgeon: Cristy Bonner DASEN, MD;  Location: [facility] OR;  Service: Orthopedics;   Laterality: Right;   REVISON OF ARTERIOVENOUS FISTULA Right 07/16/2017   Procedure: REVISION OF ARTERIOVENOUS FISTULA  Right ARM;  Surgeon: Sheree Penne Bruckner, MD;  Location: [facility] OR;  Service: Vascular;  Laterality: Right;   TOTAL HIP ARTHROPLASTY Left 11/14/2020   Procedure: LEFT TOTAL HIP ARTHROPLASTY ANTERIOR APPROACH;  Surgeon: [name] Kay HERO, MD;  Location: [facility] OR;  Service: Orthopedics;  Laterality: Left;   UPPER GASTROINTESTINAL ENDOSCOPY  2021   @ [facility]   Patient Active Problem List   Diagnosis Date Noted   Primary osteoarthritis of left knee 10/03/2023   Right hip pain 07/14/2023   Coping style affecting medical condition 06/05/2023   Spinal cord injury, cervical region, sequela (HCC) 05/30/2023   Cervical vertebral fracture (HCC) 05/25/2023   History of GI bleed 05/25/2023   C4 cervical fracture (HCC) 05/25/2023   Duodenitis    Gastritis and gastroduodenitis    Ischemic colitis (HCC)    Bright red blood per rectum    ABLA (acute blood loss anemia)    Gastric ulcer  Duodenal ulcer    Diverticulosis of colon without hemorrhage    GI bleed 09/17/2021   Acute upper GI bleeding 09/11/2021   Mucosal abnormality of esophagus    Mucosal abnormality of stomach    Mucosal abnormality of duodenum    Upper GI bleed    Left hip pain 11/16/2020   Type 2 diabetes mellitus with other specified complication (HCC) 11/14/2020   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/14/2020   Primary osteoarthritis of left hip 10/07/2020   Status post reverse total arthroplasty of right shoulder 08/24/2020   Diverticulitis 03/08/2020   ESRD (end stage renal disease) (HCC) 03/08/2020   Polyneuropathy in diseases classified elsewhere (HCC) 03/08/2020   Pure hypercholesterolemia 03/08/2020   Sacral back pain 03/08/2020   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 11/10/2019   Acute gastric ulcer with hemorrhage    Acute GI bleeding 07/31/2019   Hypotension of hemodialysis 07/31/2019    Diverticulitis large intestine 07/31/2019   Mass of soft tissue of shoulder 05/26/2019   Awaiting organ transplant status 03/18/2019   Finding of cocaine in blood 02/25/2019   Anaphylactic shock, unspecified, sequela 11/21/2018   Hypercalcemia 10/28/2018   Diarrhea, unspecified 08/08/2018   Hyperkalemia 03/12/2018   Renal cell carcinoma of left kidney (HCC) 12/24/2017   Fluid overload, unspecified 07/06/2017   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   Other specified disorders of kidney and ureter 11/22/2016   Secondary hyperparathyroidism of renal origin (HCC) 10/23/2016   Headache disorder 08/21/2016   TIA (transient ischemic attack)    Essential hypertension    Seizure (HCC)    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    Lung nodule    Diverticulitis of cecum 07/21/2015   Diverticulitis of large intestine without perforation or abscess without bleeding    Right lower quadrant pain    Obstructive sleep apnea 02/08/2015   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   Diverticulosis 07/13/2014   Lower GI bleed 07/12/2014   Refusal of blood transfusions as patient is Jehovah's Witness    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   HLD (hyperlipidemia)    PRES (posterior reversible encephalopathy syndrome)    History of CVA (cerebrovascular accident) 05/23/2014   Anemia of chronic disease 05/23/2014   GERD (gastroesophageal reflux disease) 12/09/2012   Gastrointestinal bleeding 11/12/2012   Melena 11/12/2012   Other psychoactive substance dependence, uncomplicated (HCC) 10/08/2012   Iron deficiency anemia, unspecified 06/10/2012   Moderate protein-calorie malnutrition (HCC) 07/29/2009   Coagulation defect, unspecified (HCC) 08/29/2006   Type 2 diabetes mellitus with diabetic peripheral angiopathy without gangrene (HCC) 08/29/2006   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 Denies any injury. Son helped me up  PERTINENT HISTORY: See above   ACDF C3-C5 fusion, anterior cervical corpectomy C3-C4  PAIN:  Are you having pain? Pain is off and on, R hip may need surgery in about 6 months, knees  are bone on bone, no number/10 given   PRECAUTIONS:  Precautions:  Precautions Precautions: Fall, Cervical, Other (comment) Recall of Precautions/Restrictions: Impaired Precaution/Restrictions Comments: Verbally reviewed cervical precautions. Required Braces or Orthoses: Cervical Brace Cervical Brace: Soft collar, At all times Restrictions Weight Bearing Restrictions Per Provider Order: No General: Pain: No c/o pain.   RED FLAGS: None   WEIGHT BEARING RESTRICTIONS: No  FALLS:  Has patient fallen in last 6 months? Yes. Number of falls 3- one that led to surgery, then 2 after getting home from the hospital  LIVING ENVIRONMENT: Lives with: lives with their son Lives in: House/apartment Stairs: ground floor apt  Has following equipment at home: Single point cane, Environmental consultant - 2 wheeled, and Environmental consultant - 4 wheeled  OCCUPATION: retired- used to pressure wash   PLOF: Independent, Independent with basic ADLs, Independent with gait, and Independent with transfers  PATIENT GOALS: get back to normal ability, drive convertible again   NEXT MD VISIT: Dr. Cornelio 11/15/23  OBJECTIVE:  Note: Objective measures were completed at  Evaluation unless otherwise noted.  DIAGNOSTIC FINDINGS:    CLINICAL DATA:  63 year old male with altered mental status.   EXAM: CT HEAD WITHOUT CONTRAST   TECHNIQUE: Contiguous axial images were obtained from the base of the skull through the vertex without intravenous contrast.   RADIATION DOSE REDUCTION: This exam was performed according to the departmental dose-optimization program which includes automated exposure control, adjustment of the mA and/or kV according to patient size and/or use of iterative reconstruction technique.   COMPARISON:  Brain MRI and head CT 05/25/2023.   FINDINGS: Brain:   Unusual extensive, diffuse pachymeningeal calcification which has been present since at least 2013. Stable cerebral volume. No midline shift, ventriculomegaly, mass effect, evidence of mass lesion, intracranial hemorrhage or evidence of cortically based acute infarction. Small area of chronic encephalomalacia in the medial left occipital lobe redemonstrated. Stable gray-white matter differentiation throughout the brain.   Vascular: Chronic severe pachymeningeal calcification.   Skull: Appears stable.  No acute osseous abnormality identified.   Sinuses/Orbits: Continued left middle ear and mastoid opacification. Other Visualized paranasal sinuses and mastoids are stable and well aerated.   Other: Anterior left scalp soft tissue injury has regressed since earlier in the month. Orbits soft tissues appears stable.   IMPRESSION: 1. No acute intracranial abnormality. 2. 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 3. Ongoing left middle ear and mastoid opacification. No acute osseous abnormality identified. 4. Regressed scalp soft tissue injury since 05/25/2023.  CLINICAL DATA:  Chronic bilateral knee pain and swelling.   EXAM: RIGHT KNEE - COMPLETE 4+ VIEW; LEFT KNEE - COMPLETE 4+ VIEW   COMPARISON:  Right  knee radiographs dated 12/26/2021.   FINDINGS: Right knee:   No acute fracture or dislocation. There is a small to moderate-sized joint effusion. Moderate tricompartmental degenerative changes with joint space narrowing and marginal osteophytosis. Vascular calcifications are noted.   Left knee:   No acute fracture or dislocation. There is a small joint effusion. Moderate tricompartmental degenerative changes with joint space narrowing and marginal osteophytosis. Vascular calcifications are noted.   IMPRESSION: 1. No acute osseous abnormality. 2. 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  Narrative & Impression  CLINICAL DATA:  Chronic bilateral knee pain and swelling.   EXAM: RIGHT KNEE - COMPLETE 4+ VIEW; LEFT KNEE - COMPLETE 4+ VIEW   COMPARISON:  Right knee radiographs dated 12/26/2021.   FINDINGS:  Right knee:   No acute fracture or dislocation. There is a small to moderate-sized joint effusion. Moderate tricompartmental degenerative changes with joint space narrowing and marginal osteophytosis. Vascular calcifications are noted.   Left knee:   No acute fracture or dislocation. There is a small joint effusion. Moderate tricompartmental degenerative changes with joint space narrowing and marginal osteophytosis. Vascular calcifications are noted.   IMPRESSION: 1. No acute osseous abnormality. 2. 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    Narrative & Impression CLINICAL DATA:  Right hip pain.   EXAM: DG HIP (WITH OR WITHOUT PELVIS) 2-3V RIGHT   COMPARISON:  None Available.   FINDINGS: No acute fracture or dislocation. The bones are osteopenic. Cystic changes of the right femoral head and neck measure up to 3.7 cm. Orthopedic referral is advised. Mild arthritic changes of the right hip. Total left  hip arthroplasty. The soft tissues are unremarkable. Vascular calcifications.   IMPRESSION: 1. No acute fracture or dislocation. 2. Cystic changes of the right femoral head and neck. Orthopedic referral is advised.  Lower Venous DVT Study   Patient Name:  Carl Churchill Sr.  Date of Exam:   06/10/2023  Medical Rec #: [id number]            Accession #:    [id number]  Date of Birth: 1960-09-19             Patient Gender: M  Patient Age:   14 years  Exam Location[facility]  [facility]  Procedure:      VAS US  LOWER EXTREMITY VENOUS (DVT)  Referring Phys: PRENTICE COMPTON    ---------------------------------------------------------------------------  -----    Indications: Edema.    Risk Factors: Surgery Cervical corpectomy 05/26/23.  Comparison Study: No significant changes seen since previous exam 07/04/06.   Performing Technologist: Garnette Rockers     Examination Guidelines:  A complete evaluation includes B-mode imaging, spectral Doppler, color  Doppler,  and power Doppler as needed of all accessible portions of each vessel.  Bilateral  testing is considered an integral part of a complete examination. Limited  examinations for reoccurring indications may be performed as noted. The  reflux  portion of the exam is performed with the patient in reverse  Trendelenburg.      +---------+---------------+---------+-----------+----------+--------------+   RIGHT   CompressibilityPhasicitySpontaneityPropertiesThrombus  Aging  +---------+---------------+---------+-----------+----------+--------------+   CFV     Full           Yes      Yes                                   +---------+---------------+---------+-----------+----------+--------------+   SFJ     Full                                                          +---------+---------------+---------+-----------+----------+--------------+   FV Prox  Full                                                           +---------+---------------+---------+-----------+----------+--------------+  FV Mid   Full                                                          +---------+---------------+---------+-----------+----------+--------------+   FV DistalFull                                                          +---------+---------------+---------+-----------+----------+--------------+   PFV     Full                                                          +---------+---------------+---------+-----------+----------+--------------+   POP     Full           Yes      Yes                                   +---------+---------------+---------+-----------+----------+--------------+   PTV     Full                    Yes                                   +---------+---------------+---------+-----------+----------+--------------+   PERO    Full                    Yes                                   +---------+---------------+---------+-----------+----------+--------------+          +---------+---------------+---------+-----------+----------+--------------+   LEFT    CompressibilityPhasicitySpontaneityPropertiesThrombus  Aging  +---------+---------------+---------+-----------+----------+--------------+   CFV     Full           Yes      Yes                                   +---------+---------------+---------+-----------+----------+--------------+   SFJ     Full                                                          +---------+---------------+---------+-----------+----------+--------------+   FV Prox  Full                                                          +---------+---------------+---------+-----------+----------+--------------+   FV Mid   Full                                                          +---------+---------------+---------+-----------+----------+--------------+  FV  DistalFull                                                          +---------+---------------+---------+-----------+----------+--------------+   PFV     Full                                                          +---------+---------------+---------+-----------+----------+--------------+   POP     Full           Yes      Yes                                   +---------+---------------+---------+-----------+----------+--------------+   PTV     Full                    Yes                                   +---------+---------------+---------+-----------+----------+--------------+   PERO    Full                    Yes                                   +---------+---------------+---------+-----------+----------+--------------+               Summary:  BILATERAL:  - No evidence of deep vein thrombosis seen in the lower extremities,  bilaterally.  -No evidence of popliteal cyst, bilaterally.    PATIENT SURVEYS:  PSFS: THE PATIENT SPECIFIC FUNCTIONAL SCALE  Place score of 0-10 (0 = unable to perform activity and 10 = able to perform activity at the same level as before injury or problem)  Activity Date: eval 09/12/23    Getting in and out of car (anything lower than 20 inches)  0    2. Balance  3    3. Walking without a device  2    4.  Social activities  0    Total Score 1.25      Total Score = Sum of activity scores/number of activities  Minimally Detectable Change: 3 points (for single activity); 2 points (for average score)  [employer] (nd). The Patient Specific Functional Scale . Retrieved from [email or web address]   COGNITION: Overall cognitive status: Within functional limits for tasks assessed     SENSATION: Hx of neuropathy even before fall/surgery     LOWER EXTREMITY MMT:  MMT Right eval Left eval  Hip flexion 3- 3+  Hip extension    Hip abduction     Hip adduction    Hip internal rotation    Hip external rotation    Knee flexion 3- 4+  Knee extension 3- 5  Ankle dorsiflexion 4 5  Ankle plantarflexion    Ankle inversion    Ankle eversion     (Blank rows = not tested)    GAIT: Distance walked: in clinic  distances  Assistive device utilized: Environmental consultant - 2 wheeled Level of assistance: Modified independence Comments: slow but steady with RW                                                                                                                                TREATMENT DATE:   10/10/23 Nustep L 5 Green tband HS curls 2 sets 10 3# LAQ 2 sets 10 3# standing with RW 10 x BIL-marching,hip flex,ext ,abd and HS curl HHA 3# marching fwd and back 15 feet 2 x each HHA 3# side stepping 15 feet 2 x each Leg Press 20# BIL 10 x seat on 9, SL 10 x each 20 #  left leg seat on 11, RT needs assist to start mvmt. 20# BIL 10 x seat 8  10/08/23 Nustep L 5 Green tband HS curl 2 sets 10 2# LAQ 2 sets 10- quad lag BIL RT > left Standing with RW 2# marching,SL hip flex,ext and abd 20 x alt STS elevated mat 27.5  inches 2 sets 5 hands on knee, min A Amb with SPC CGA 100 feet with 2 slight sways but reqained Step up 4 in with RW 10 x BIL Step up 4 in with airex min A on RT 2 sets 5, left 10 x      10/01/23 UBE L 3 2 min fwd and back NuStep L 5 x 6 min STS elevated mat 27.5  inches 2 sets 5 hands on knee, CGA SLS HS curls 10 x 15# RT, left 10x 20#, BIL 25# 10 x Knee ext 5# LLE 2 sets 5 RT knee LAQ 3 sets 5     09/25/23 NuStep L 5 x 6 min LAQ RLE no weight, LLE 3lb cuff 2x10  Seated match 3lb 2x10 HS curls green 2x15 Sit to stand form elevated surface 2x5 with UE use Side steps at mat  Standing march in [facility]  09/12/23  Eval, POC      PATIENT EDUCATION:  Education details: exam findings, POC Person educated: Patient Education method: Explanation Education comprehension: verbalized understanding, returned  demonstration, and needs further education  HOME EXERCISE PROGRAM: Educ on getting ankle wts, adjustable up to 5 is rec as we used 3 #- okayed doing seated and standing ex with RW  ASSESSMENT:  CLINICAL IMPRESSION: pt states he fell the other day stepping off curb with SPC with wrong foot, son had to get him up but he denies injury. Stressed that walker is safer outside home and if he wants to try [facility] rec in home only at this time. LOB with HHA side stepping RT knee buckled, also buckled with RT step ups with RW and fell back onto mat. Leg press today with assist with SL RT and cued with BIL to use legs =  Patient is a 63 y.o. M who was seen today for physical therapy evaluation and treatment for S14.109S (ICD-10-CM) - Spinal  cord injury, cervical region, sequela (HCC) Pt tolerated a initial progression to therapy well. RLE is significantly weaker than R. Pt had log legs and a relatively short torso making it difficult to stand from low surfaces in conjunction with weak LE. Decrease coordination with side steps. No pain during session. Limited ROM with RLE during isolated exercises.  Will make every effort to improve function and work towards goals.   OBJECTIVE IMPAIRMENTS: Abnormal gait, decreased activity tolerance, decreased balance, decreased coordination, decreased knowledge of use of DME, decreased mobility, difficulty walking, decreased strength, postural dysfunction, and pain.   ACTIVITY LIMITATIONS: standing, squatting, stairs, transfers, bed mobility, toileting, dressing, and locomotion level  PARTICIPATION LIMITATIONS: shopping, community activity, and yard work  PERSONAL FACTORS: Age, Behavior pattern, Education, Fitness, Past/current experiences, Profession, Social background, and Time since onset of injury/illness/exacerbation are also affecting patient's functional outcome.   REHAB POTENTIAL: Good  CLINICAL DECISION MAKING: Evolving/moderate complexity  EVALUATION COMPLEXITY:  Moderate   GOALS: Goals reviewed with patient? No  SHORT TERM GOALS: Target date: 10/24/2023   Will be compliant with appropriate progressive HEP  Baseline: Goal status: 10/08/23 MET  2.  Will be able to consistently perform functional STS tranfers from surfaces 20 inches or less  Baseline:  Goal status:10/08/23 on going  3.  Will be able to ambulate at least 330ft with quad cane no more than light MinA  Baseline:  Goal status: 10/08/23 progressing  4.  MMT to be at least 3+/5 in all tested groups R LE  Baseline:  Goal status: INITIAL    LONG TERM GOALS: Target date: 12/05/2023      R LE MMT to be at least 4-/5 in all tested groups  Baseline:  Goal status: INITIAL  2.  Will score at least 18 on DGI with no device to show reduced fall risk  Baseline:  Goal status: INITIAL  3.  Will be able to reciprocally ascend/descend steps without difficulty and U rail  Baseline:  Goal status: INITIAL  4.  Will be able to ambulate community distances with LRAD, Mod(I) to allow for improved community access and participating in social activities  Baseline:  Goal status: INITIAL  5.  Will be compliant with appropriate gym based exercise program by time of DC  Baseline:  Goal status: INITIAL    PLAN:  PT FREQUENCY: 2x/week  PT DURATION: 12 weeks  PLANNED INTERVENTIONS: 97750- Physical Performance Testing, 97110-Therapeutic exercises, 97530- Therapeutic activity, [id number]- Neuromuscular re-education, 97535- Self Care, [id number]- Manual therapy, and [id number]- Gait training  PLAN FOR NEXT SESSION: progress func strength. See if pt got ankle wts and doing leg ex at home Jon Lesly Joslyn PTA 10/10/23 8:03 AM

## 2023-10-11 DIAGNOSIS — D689 Coagulation defect, unspecified: Secondary | ICD-10-CM | POA: Diagnosis not present

## 2023-10-13 DIAGNOSIS — Z992 Dependence on renal dialysis: Secondary | ICD-10-CM | POA: Diagnosis not present

## 2023-10-13 DIAGNOSIS — N186 End stage renal disease: Secondary | ICD-10-CM | POA: Diagnosis not present

## 2023-10-13 DIAGNOSIS — I120 Hypertensive chronic kidney disease with stage 5 chronic kidney disease or end stage renal disease: Secondary | ICD-10-CM | POA: Diagnosis not present

## 2023-10-14 DIAGNOSIS — N2581 Secondary hyperparathyroidism of renal origin: Secondary | ICD-10-CM | POA: Diagnosis not present

## 2023-10-14 DIAGNOSIS — D631 Anemia in chronic kidney disease: Secondary | ICD-10-CM | POA: Diagnosis not present

## 2023-10-14 DIAGNOSIS — R197 Diarrhea, unspecified: Secondary | ICD-10-CM | POA: Diagnosis not present

## 2023-10-14 DIAGNOSIS — Z992 Dependence on renal dialysis: Secondary | ICD-10-CM | POA: Diagnosis not present

## 2023-10-14 DIAGNOSIS — D689 Coagulation defect, unspecified: Secondary | ICD-10-CM | POA: Diagnosis not present

## 2023-10-14 DIAGNOSIS — D509 Iron deficiency anemia, unspecified: Secondary | ICD-10-CM | POA: Diagnosis not present

## 2023-10-16 ENCOUNTER — Other Ambulatory Visit: Payer: Self-pay

## 2023-10-16 ENCOUNTER — Ambulatory Visit: Attending: Registered Nurse

## 2023-10-16 DIAGNOSIS — R2681 Unsteadiness on feet: Secondary | ICD-10-CM | POA: Insufficient documentation

## 2023-10-16 DIAGNOSIS — R296 Repeated falls: Secondary | ICD-10-CM | POA: Insufficient documentation

## 2023-10-16 DIAGNOSIS — N2581 Secondary hyperparathyroidism of renal origin: Secondary | ICD-10-CM | POA: Diagnosis not present

## 2023-10-16 DIAGNOSIS — M25611 Stiffness of right shoulder, not elsewhere classified: Secondary | ICD-10-CM | POA: Diagnosis not present

## 2023-10-16 DIAGNOSIS — D689 Coagulation defect, unspecified: Secondary | ICD-10-CM | POA: Diagnosis not present

## 2023-10-16 DIAGNOSIS — R2689 Other abnormalities of gait and mobility: Secondary | ICD-10-CM | POA: Insufficient documentation

## 2023-10-16 DIAGNOSIS — D631 Anemia in chronic kidney disease: Secondary | ICD-10-CM | POA: Diagnosis not present

## 2023-10-16 DIAGNOSIS — M25561 Pain in right knee: Secondary | ICD-10-CM | POA: Insufficient documentation

## 2023-10-16 DIAGNOSIS — R197 Diarrhea, unspecified: Secondary | ICD-10-CM | POA: Diagnosis not present

## 2023-10-16 DIAGNOSIS — M6281 Muscle weakness (generalized): Secondary | ICD-10-CM | POA: Insufficient documentation

## 2023-10-16 DIAGNOSIS — G8929 Other chronic pain: Secondary | ICD-10-CM | POA: Insufficient documentation

## 2023-10-16 DIAGNOSIS — N186 End stage renal disease: Secondary | ICD-10-CM | POA: Diagnosis not present

## 2023-10-16 DIAGNOSIS — M25511 Pain in right shoulder: Secondary | ICD-10-CM | POA: Diagnosis not present

## 2023-10-16 DIAGNOSIS — R262 Difficulty in walking, not elsewhere classified: Secondary | ICD-10-CM | POA: Insufficient documentation

## 2023-10-16 DIAGNOSIS — D509 Iron deficiency anemia, unspecified: Secondary | ICD-10-CM | POA: Diagnosis not present

## 2023-10-16 DIAGNOSIS — Z992 Dependence on renal dialysis: Secondary | ICD-10-CM | POA: Diagnosis not present

## 2023-10-16 NOTE — Therapy (Signed)
 OUTPATIENT PHYSICAL THERAPY LOWER EXTREMITY TREATMENT   Patient Name: Carl Gasparini Sr. MRN: [id number] DOB:1961-01-16, 63 y.o., male Today's Date: 10/16/2023  END OF SESSION:  PT End of Session - 10/16/23 1604     Activity Tolerance Patient tolerated treatment well    Behavior During Therapy [facility] for tasks assessed/performed           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    Parathyroid  abnormality (HCC)    ectopic parathyroid  gland   Presence of arteriovenous fistula for hemodialysis, primary (HCC)    RUE PER PT RLE   Refusal of blood product    NO WHOLE BLOOD PROUCTS   Renal cell carcinoma (HCC)    s/p hand assisted laparoscopic bilateral nephrectomies 11/29/17, + RCC left   Secondary hyperparathyroidism (HCC)    Seizures (HCC)    one episode in past, due to elevated Potassium 08/02/20- at least 4 years ago   Sleep apnea    doesn't use CPAP anymore since weight loss   Stroke [facility])    no residual   Past Surgical History:  Procedure Laterality Date   ANTERIOR CERVICAL CORPECTOMY N/A 05/25/2023   Procedure: ANTERIOR CERVICAL CORPECTOMY CERVICAL FOUR, CERVICAL THREE - CERVICAL FIVE FUSION;  Surgeon: [name] Shove, MD;  Location: [facility] OR;  Service: Neurosurgery;  Laterality: N/A;   AV FISTULA PLACEMENT Right    right arm   BIOPSY  08/01/2019   Procedure: BIOPSY;  Surgeon: Kristie Lamprey, MD;  Location: [facility] ENDOSCOPY;  Service: Endoscopy;;   BIOPSY   11/18/2020   Procedure: BIOPSY;  Surgeon: Stacia Glendia BRAVO, MD;  Location: [facility] ENDOSCOPY;  Service: Gastroenterology;;   BIOPSY  09/13/2021   Procedure: BIOPSY;  Surgeon: Wilhelmenia Aloha Raddle., MD;  Location: [facility] ENDOSCOPY;  Service: Gastroenterology;;   CATARACT EXTRACTION W/ INTRAOCULAR LENS  IMPLANT, BILATERAL     COLON SURGERY     COLONOSCOPY N/A 08/04/2015   Procedure: COLONOSCOPY;  Surgeon: Gordy CHRISTELLA Starch, MD;  Location: [facility] ENDOSCOPY;  Service: Endoscopy;  Laterality: N/A;   COLONOSCOPY  2017   JMP@ [facility]-good prep-mass -recall 1 yr   COLONOSCOPY N/A 09/13/2021   Procedure: COLONOSCOPY;  Surgeon: Mansouraty, Aloha Raddle., MD;  Location: [facility] ENDOSCOPY;  Service: Gastroenterology;  Laterality: N/A;   COLONOSCOPY WITH PROPOFOL  N/A 11/25/2020   Procedure: COLONOSCOPY WITH PROPOFOL ;  Surgeon: Federico Rosario BROCKS, MD;  Location: [facility] ENDOSCOPY;  Service: Gastroenterology;  Laterality: N/A;   COLONOSCOPY WITH PROPOFOL  N/A 09/23/2021   Procedure: COLONOSCOPY WITH PROPOFOL ;  Surgeon: San Sandor GAILS, DO;  Location: [facility] ENDOSCOPY;  Service: Gastroenterology;  Laterality: N/A;   ENTEROSCOPY N/A 09/23/2021   Procedure: ENTEROSCOPY;  Surgeon: San Sandor GAILS, DO;  Location: [facility] ENDOSCOPY;  Service: Gastroenterology;  Laterality: N/A;  Will place order for video capsule study as  we may opt to place it during procedure   ESOPHAGOGASTRODUODENOSCOPY N/A 08/01/2019   Procedure: ESOPHAGOGASTRODUODENOSCOPY (EGD);  Surgeon: Kristie Lamprey, MD;  Location: [facility] ENDOSCOPY;  Service: Endoscopy;  Laterality: N/A;   ESOPHAGOGASTRODUODENOSCOPY N/A 11/18/2020   Procedure: ESOPHAGOGASTRODUODENOSCOPY (EGD);  Surgeon: Stacia Glendia BRAVO, MD;  Location: [facility] ENDOSCOPY;  Service: Gastroenterology;  Laterality: N/A;   ESOPHAGOGASTRODUODENOSCOPY (EGD) WITH PROPOFOL  N/A 08/04/2019   Procedure: ESOPHAGOGASTRODUODENOSCOPY (EGD) WITH PROPOFOL ;  Surgeon: Leigh Elspeth SQUIBB, MD;  Location: [facility] ENDOSCOPY;  Service: Gastroenterology;  Laterality:  N/A;   ESOPHAGOGASTRODUODENOSCOPY (EGD) WITH PROPOFOL  N/A 09/13/2021   Procedure: ESOPHAGOGASTRODUODENOSCOPY (EGD) WITH PROPOFOL ;  Surgeon: Wilhelmenia Aloha Raddle., MD;  Location: [facility] ENDOSCOPY;  Service: Gastroenterology;  Laterality: N/A;   GIVENS CAPSULE STUDY N/A 09/23/2021   Procedure: GIVENS CAPSULE STUDY;  Surgeon: San Sandor GAILS, DO;  Location: [facility] ENDOSCOPY;  Service: Gastroenterology;  Laterality: N/A;   graft left arm Left    for dialysis x 2. Removed   HOT HEMOSTASIS  11/18/2020   Procedure: HOT HEMOSTASIS (ARGON PLASMA COAGULATION/BICAP);  Surgeon: Stacia Glendia BRAVO, MD;  Location: [facility] ENDOSCOPY;  Service: Gastroenterology;;   HOT HEMOSTASIS N/A 09/23/2021   Procedure: HOT HEMOSTASIS (ARGON PLASMA COAGULATION/BICAP);  Surgeon: San Sandor GAILS, DO;  Location: [facility] ENDOSCOPY;  Service: Gastroenterology;  Laterality: N/A;   INSERTION OF DIALYSIS CATHETER     Rt chest   LAPAROSCOPIC RIGHT COLECTOMY N/A 08/05/2015   Procedure: LAPAROSCOPIC RIGHT COLECTOMY- ASCENDING;  Surgeon: Jina Nephew, MD;  Location: [facility] OR;  Service: General;  Laterality: N/A;   MASS EXCISION Left 05/28/2019   Procedure: EXCISION SOFT TISSUE MASS LEFT SHOULDER;  Surgeon: Eletha Boas, MD;  Location: [facility] ORS;  Service: General;  Laterality: Left;   NEPHRECTOMY Bilateral    PARATHYROIDECTOMY N/A 06/12/2016   Procedure: TOTAL PARATHYROIDECTOMY WITH AUTOTRANSPLANTATION TO LEFT FOREARM;  Surgeon: Boas Eletha, MD;  Location: [facility] OR;  Service: General;  Laterality: N/A;   PARATHYROIDECTOMY N/A 10/23/2016   Procedure: PARATHYROIDECTOMY;  Surgeon: Eletha Boas, MD;  Location: [facility] OR;  Service: General;  Laterality: N/A;   REVERSE SHOULDER ARTHROPLASTY Right 08/24/2020   Procedure: REVERSE SHOULDER ARTHROPLASTY;  Surgeon: Cristy Bonner DASEN, MD;  Location: [facility] OR;  Service: Orthopedics;  Laterality: Right;   REVISON OF ARTERIOVENOUS FISTULA Right 07/16/2017   Procedure: REVISION OF ARTERIOVENOUS FISTULA  Right ARM;  Surgeon: Sheree Penne Bruckner, MD;  Location: [facility] OR;  Service: Vascular;  Laterality: Right;   TOTAL HIP ARTHROPLASTY Left 11/14/2020   Procedure: LEFT TOTAL HIP ARTHROPLASTY ANTERIOR APPROACH;  Surgeon: [name] Kay HERO, MD;  Location: [facility] OR;  Service: Orthopedics;  Laterality: Left;   UPPER GASTROINTESTINAL ENDOSCOPY  2021   @ [facility]   Patient Active Problem List   Diagnosis Date Noted   Primary osteoarthritis of left knee 10/03/2023   Right hip pain 07/14/2023   Coping style affecting medical condition 06/05/2023   Spinal cord injury, cervical region, sequela (HCC) 05/30/2023   Cervical vertebral fracture (HCC) 05/25/2023   History of GI bleed 05/25/2023   C4 cervical fracture (HCC) 05/25/2023   Duodenitis    Gastritis and gastroduodenitis    Ischemic colitis (HCC)    Bright red blood per rectum    ABLA (acute blood loss anemia)    Gastric ulcer    Duodenal ulcer    Diverticulosis of colon without hemorrhage    GI bleed 09/17/2021   Acute upper GI bleeding 09/11/2021   Mucosal abnormality of esophagus    Mucosal abnormality of stomach  Mucosal abnormality of duodenum    Upper GI bleed    Left hip pain 11/16/2020   Type 2 diabetes mellitus with other specified complication (HCC) 11/14/2020   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/14/2020   Primary osteoarthritis of left hip 10/07/2020   Status post reverse total arthroplasty of right shoulder 08/24/2020   Diverticulitis 03/08/2020   ESRD (end stage renal disease) (HCC) 03/08/2020   Polyneuropathy in diseases classified elsewhere (HCC) 03/08/2020   Pure hypercholesterolemia 03/08/2020   Sacral back pain 03/08/2020   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 11/10/2019   Acute gastric ulcer with hemorrhage    Acute GI bleeding 07/31/2019   Hypotension of hemodialysis 07/31/2019   Diverticulitis large intestine 07/31/2019   Mass of soft tissue of shoulder 05/26/2019   Awaiting organ transplant status 03/18/2019   Finding of cocaine in  blood 02/25/2019   Anaphylactic shock, unspecified, sequela 11/21/2018   Hypercalcemia 10/28/2018   Diarrhea, unspecified 08/08/2018   Hyperkalemia 03/12/2018   Renal cell carcinoma of left kidney (HCC) 12/24/2017   Fluid overload, unspecified 07/06/2017   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   Other specified disorders of kidney and ureter 11/22/2016   Secondary hyperparathyroidism of renal origin (HCC) 10/23/2016   Headache disorder 08/21/2016   TIA (transient ischemic attack)    Essential hypertension    Seizure (HCC)    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    Lung nodule    Diverticulitis of cecum 07/21/2015   Diverticulitis of large intestine without perforation or abscess without bleeding    Right lower quadrant pain    Obstructive sleep apnea 02/08/2015   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   Diverticulosis 07/13/2014   Lower GI bleed 07/12/2014   Refusal of blood transfusions as patient is Jehovah's Witness    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   HLD (hyperlipidemia)    PRES (posterior reversible encephalopathy syndrome)    History of CVA (cerebrovascular accident) 05/23/2014   Anemia of chronic disease 05/23/2014   GERD (gastroesophageal reflux disease) 12/09/2012   Gastrointestinal bleeding 11/12/2012   Melena 11/12/2012   Other psychoactive substance dependence, uncomplicated (HCC) 10/08/2012   Iron deficiency anemia, unspecified 06/10/2012   Moderate protein-calorie malnutrition (HCC) 07/29/2009   Coagulation defect, unspecified (HCC) 08/29/2006   Type 2 diabetes mellitus with diabetic peripheral angiopathy without gangrene (HCC) 08/29/2006   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 stage renal disease (HCC) 08/02/2006    PCP: [name] MD   REFERRING PROVIDER: Debby Fidela CROME, NP  REFERRING DIAG:  Diagnosis  S14.109S  (ICD-10-CM) - Spinal cord injury, cervical region, sequela (HCC)    THERAPY DIAG:  Difficulty in walking, not elsewhere classified  Muscle weakness (generalized)  Unsteadiness on feet  Repeated falls  Other abnormalities of gait and mobility  Rationale for Evaluation and Treatment: Rehabilitation  ONSET DATE:  ACDF 05/25/23  SUBJECTIVE:   SUBJECTIVE STATEMENT: I am more tired today because my dialysis days are  Mon wed fri and I couldn't get in on tues and thurs  PERTINENT HISTORY: See above   ACDF C3-C5 fusion, anterior cervical corpectomy C3-C4  PAIN:  Are you having pain? Pain is off and on, R hip may need surgery in about 6 months, knees are bone on bone, no number/10 given   PRECAUTIONS:  Precautions:  Precautions Precautions: Fall, Cervical, Other (comment) Recall of Precautions/Restrictions: Impaired Precaution/Restrictions Comments: Verbally reviewed cervical  precautions. Required Braces or Orthoses: Cervical Brace Cervical Brace: Soft collar, At all times Restrictions Weight Bearing Restrictions Per Provider Order: No General: Pain: No c/o pain.   RED FLAGS: None   WEIGHT BEARING RESTRICTIONS: No  FALLS:  Has patient fallen in last 6 months? Yes. Number of falls 3- one that led to surgery, then 2 after getting home from the hospital  LIVING ENVIRONMENT: Lives with: lives with their son Lives in: House/apartment Stairs: ground floor apt  Has following equipment at home: Single point cane, Environmental consultant - 2 wheeled, and Environmental consultant - 4 wheeled  OCCUPATION: retired- used to pressure wash   PLOF: Independent, Independent with basic ADLs, Independent with gait, and Independent with transfers  PATIENT GOALS: get back to normal ability, drive convertible again   NEXT MD VISIT: Dr. Cornelio 11/15/23  OBJECTIVE:  Note: Objective measures were completed at Evaluation unless otherwise noted.  DIAGNOSTIC FINDINGS:    CLINICAL DATA:  63 year old male with  altered mental status.   EXAM: CT HEAD WITHOUT CONTRAST   TECHNIQUE: Contiguous axial images were obtained from the base of the skull through the vertex without intravenous contrast.   RADIATION DOSE REDUCTION: This exam was performed according to the departmental dose-optimization program which includes automated exposure control, adjustment of the mA and/or kV according to patient size and/or use of iterative reconstruction technique.   COMPARISON:  Brain MRI and head CT 05/25/2023.   FINDINGS: Brain:   Unusual extensive, diffuse pachymeningeal calcification which has been present since at least 2013. Stable cerebral volume. No midline shift, ventriculomegaly, mass effect, evidence of mass lesion, intracranial hemorrhage or evidence of cortically based acute infarction. Small area of chronic encephalomalacia in the medial left occipital lobe redemonstrated. Stable gray-white matter differentiation throughout the brain.   Vascular: Chronic severe pachymeningeal calcification.   Skull: Appears stable.  No acute osseous abnormality identified.   Sinuses/Orbits: Continued left middle ear and mastoid opacification. Other Visualized paranasal sinuses and mastoids are stable and well aerated.   Other: Anterior left scalp soft tissue injury has regressed since earlier in the month. Orbits soft tissues appears stable.   IMPRESSION: 1. No acute intracranial abnormality. 2. 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 3. Ongoing left middle ear and mastoid opacification. No acute osseous abnormality identified. 4. Regressed scalp soft tissue injury since 05/25/2023.  CLINICAL DATA:  Chronic bilateral knee pain and swelling.   EXAM: RIGHT KNEE - COMPLETE 4+ VIEW; LEFT KNEE - COMPLETE 4+ VIEW   COMPARISON:  Right knee radiographs dated 12/26/2021.   FINDINGS: Right knee:   No acute fracture or dislocation.  There is a small to moderate-sized joint effusion. Moderate tricompartmental degenerative changes with joint space narrowing and marginal osteophytosis. Vascular calcifications are noted.   Left knee:   No acute fracture or dislocation. There is a small joint effusion. Moderate tricompartmental degenerative changes with joint space narrowing and marginal osteophytosis. Vascular calcifications are noted.   IMPRESSION: 1. No acute osseous abnormality. 2. 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  Narrative & Impression  CLINICAL DATA:  Chronic bilateral knee pain and swelling.   EXAM: RIGHT KNEE - COMPLETE 4+ VIEW; LEFT KNEE - COMPLETE 4+ VIEW   COMPARISON:  Right knee radiographs dated 12/26/2021.   FINDINGS: Right knee:   No acute fracture or dislocation. There is a small to moderate-sized joint effusion. Moderate tricompartmental degenerative changes with joint space narrowing and marginal osteophytosis.  Vascular calcifications are noted.   Left knee:   No acute fracture or dislocation. There is a small joint effusion. Moderate tricompartmental degenerative changes with joint space narrowing and marginal osteophytosis. Vascular calcifications are noted.   IMPRESSION: 1. No acute osseous abnormality. 2. 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    Narrative & Impression CLINICAL DATA:  Right hip pain.   EXAM: DG HIP (WITH OR WITHOUT PELVIS) 2-3V RIGHT   COMPARISON:  None Available.   FINDINGS: No acute fracture or dislocation. The bones are osteopenic. Cystic changes of the right femoral head and neck measure up to 3.7 cm. Orthopedic referral is advised. Mild arthritic changes of the right hip. Total left hip arthroplasty. The soft tissues are unremarkable. Vascular calcifications.   IMPRESSION: 1.  No acute fracture or dislocation. 2. Cystic changes of the right femoral head and neck. Orthopedic referral is advised.  Lower Venous DVT Study   Patient Name:  Carl Kiehn Sr.  Date of Exam:   06/10/2023  Medical Rec #: [id number]            Accession #:    [id number]  Date of Birth: [birth year]             Patient Gender: M  Patient Age:   78 years  Exam Location[facility]  [facility]  Procedure:      VAS US  LOWER EXTREMITY VENOUS (DVT)  Referring Phys: PRENTICE COMPTON    ---------------------------------------------------------------------------  -----    Indications: Edema.    Risk Factors: Surgery Cervical corpectomy 05/26/23.  Comparison Study: No significant changes seen since previous exam 07/04/06.   Performing Technologist: Garnette Rockers     Examination Guidelines:  A complete evaluation includes B-mode imaging, spectral Doppler, color  Doppler,  and power Doppler as needed of all accessible portions of each vessel.  Bilateral  testing is considered an integral part of a complete examination. Limited  examinations for reoccurring indications may be performed as noted. The  reflux  portion of the exam is performed with the patient in reverse  Trendelenburg.      +---------+---------------+---------+-----------+----------+--------------+   RIGHT   CompressibilityPhasicitySpontaneityPropertiesThrombus  Aging  +---------+---------------+---------+-----------+----------+--------------+   CFV     Full           Yes      Yes                                   +---------+---------------+---------+-----------+----------+--------------+   SFJ     Full                                                          +---------+---------------+---------+-----------+----------+--------------+   FV Prox  Full                                                          +---------+---------------+---------+-----------+----------+--------------+   FV  Mid   Full                                                          +---------+---------------+---------+-----------+----------+--------------+  FV DistalFull                                                          +---------+---------------+---------+-----------+----------+--------------+   PFV     Full                                                          +---------+---------------+---------+-----------+----------+--------------+   POP     Full           Yes      Yes                                   +---------+---------------+---------+-----------+----------+--------------+   PTV     Full                    Yes                                   +---------+---------------+---------+-----------+----------+--------------+   PERO    Full                    Yes                                   +---------+---------------+---------+-----------+----------+--------------+          +---------+---------------+---------+-----------+----------+--------------+   LEFT    CompressibilityPhasicitySpontaneityPropertiesThrombus  Aging  +---------+---------------+---------+-----------+----------+--------------+   CFV     Full           Yes      Yes                                   +---------+---------------+---------+-----------+----------+--------------+   SFJ     Full                                                          +---------+---------------+---------+-----------+----------+--------------+   FV Prox  Full                                                          +---------+---------------+---------+-----------+----------+--------------+   FV Mid   Full                                                          +---------+---------------+---------+-----------+----------+--------------+   FV DistalFull                                                           +---------+---------------+---------+-----------+----------+--------------+  PFV     Full                                                          +---------+---------------+---------+-----------+----------+--------------+   POP     Full           Yes      Yes                                   +---------+---------------+---------+-----------+----------+--------------+   PTV     Full                    Yes                                   +---------+---------------+---------+-----------+----------+--------------+   PERO    Full                    Yes                                   +---------+---------------+---------+-----------+----------+--------------+               Summary:  BILATERAL:  - No evidence of deep vein thrombosis seen in the lower extremities,  bilaterally.  -No evidence of popliteal cyst, bilaterally.    PATIENT SURVEYS:  PSFS: THE PATIENT SPECIFIC FUNCTIONAL SCALE  Place score of 0-10 (0 = unable to perform activity and 10 = able to perform activity at the same level as before injury or problem)  Activity Date: eval 09/12/23    Getting in and out of car (anything lower than 20 inches)  0    2. Balance  3    3. Walking without a device  2    4.  Social activities  0    Total Score 1.25      Total Score = Sum of activity scores/number of activities  Minimally Detectable Change: 3 points (for single activity); 2 points (for average score)  [employer] (nd). The Patient Specific Functional Scale . Retrieved from [email or web address]   COGNITION: Overall cognitive status: Within functional limits for tasks assessed     SENSATION: Hx of neuropathy even before fall/surgery     LOWER EXTREMITY MMT:  MMT Right eval Left eval  Hip flexion 3- 3+  Hip extension    Hip abduction    Hip adduction    Hip internal rotation    Hip external rotation     Knee flexion 3- 4+  Knee extension 3- 5  Ankle dorsiflexion 4 5  Ankle plantarflexion    Ankle inversion    Ankle eversion     (Blank rows = not tested)    GAIT: Distance walked: in clinic distances  Assistive device utilized: Walker - 2 wheeled Level of assistance: Modified independence Comments: slow but steady with RW  TREATMENT DATE:  10/16/23:  Nustep L 5, 6 min Lat rows(airex in seat) 20# 2 x 10 Rows standing 15# 2 x 10 Knee ext B 5# 2 x 10 Knee flexion 25# 2 x 10, 35# 1 x 10 Sit to stand from elevated mat 7 x Chest presses for, side, side 10 x each blue mediball   10/10/23 Nustep L 5 Green tband HS curls 2 sets 10 3# LAQ 2 sets 10 3# standing with RW 10 x BIL-marching,hip flex,ext ,abd and HS curl HHA 3# marching fwd and back 15 feet 2 x each HHA 3# side stepping 15 feet 2 x each Leg Press 20# BIL 10 x seat on 9, SL 10 x each 20 #  left leg seat on 11, RT needs assist to start mvmt. 20# BIL 10 x seat 8  10/08/23 Nustep L 5 Green tband HS curl 2 sets 10 2# LAQ 2 sets 10- quad lag BIL RT > left Standing with RW 2# marching,SL hip flex,ext and abd 20 x alt STS elevated mat 27.5  inches 2 sets 5 hands on knee, min A Amb with SPC CGA 100 feet with 2 slight sways but reqained Step up 4 in with RW 10 x BIL Step up 4 in with airex min A on RT 2 sets 5, left 10 x      10/01/23 UBE L 3 2 min fwd and back NuStep L 5 x 6 min STS elevated mat 27.5  inches 2 sets 5 hands on knee, CGA SLS HS curls 10 x 15# RT, left 10x 20#, BIL 25# 10 x Knee ext 5# LLE 2 sets 5 RT knee LAQ 3 sets 5     09/25/23 NuStep L 5 x 6 min LAQ RLE no weight, LLE 3lb cuff 2x10  Seated match 3lb 2x10 HS curls green 2x15 Sit to stand form elevated surface 2x5 with UE use Side steps at mat  Standing march in [facility]  09/12/23  Eval, POC      PATIENT  EDUCATION:  Education details: exam findings, POC Person educated: Patient Education method: Explanation Education comprehension: verbalized understanding, returned demonstration, and needs further education  HOME EXERCISE PROGRAM: Educ on getting ankle wts, adjustable up to 5 is rec as we used 3 #- okayed doing seated and standing ex with RW  ASSESSMENT:  CLINICAL IMPRESSION: 10/16/23: Patient without further falls, progressed with strengthening ex for trunk, LE's, UE's.  Very difficult with sit to stand, has to rely on hands a lot to manage safely. Will continue with physical therapy for purposeful progression with LE strength and function.  Patient is a 63 y.o. M who was seen today for physical therapy evaluation and treatment for S14.109S (ICD-10-CM) - Spinal cord injury, cervical region, sequela (HCC) Pt tolerated a initial progression to therapy well. RLE is significantly weaker than R. Pt had log legs and a relatively short torso making it difficult to stand from low surfaces in conjunction with weak LE. Decrease coordination with side steps. No pain during session. Limited ROM with RLE during isolated exercises.  Will make every effort to improve function and work towards goals.   OBJECTIVE IMPAIRMENTS: Abnormal gait, decreased activity tolerance, decreased balance, decreased coordination, decreased knowledge of use of DME, decreased mobility, difficulty walking, decreased strength, postural dysfunction, and pain.   ACTIVITY LIMITATIONS: standing, squatting, stairs, transfers, bed mobility, toileting, dressing, and locomotion level  PARTICIPATION LIMITATIONS: shopping, community activity, and yard work  PERSONAL FACTORS: Age, Behavior pattern, Education,  Fitness, Past/current experiences, Profession, Social background, and Time since onset of injury/illness/exacerbation are also affecting patient's functional outcome.   REHAB POTENTIAL: Good  CLINICAL DECISION MAKING: Evolving/moderate  complexity  EVALUATION COMPLEXITY: Moderate   GOALS: Goals reviewed with patient? No  SHORT TERM GOALS: Target date: 10/24/2023   Will be compliant with appropriate progressive HEP  Baseline: Goal status: 10/08/23 MET  2.  Will be able to consistently perform functional STS tranfers from surfaces 20 inches or less  Baseline:  Goal status:10/08/23 on going  3.  Will be able to ambulate at least 390ft with quad cane no more than light MinA  Baseline:  Goal status: 10/08/23 progressing  4.  MMT to be at least 3+/5 in all tested groups R LE  Baseline:  Goal status: INITIAL    LONG TERM GOALS: Target date: 12/05/2023      R LE MMT to be at least 4-/5 in all tested groups  Baseline:  Goal status: INITIAL  2.  Will score at least 18 on DGI with no device to show reduced fall risk  Baseline:  Goal status: INITIAL  3.  Will be able to reciprocally ascend/descend steps without difficulty and U rail  Baseline:  Goal status: INITIAL  4.  Will be able to ambulate community distances with LRAD, Mod(I) to allow for improved community access and participating in social activities  Baseline:  Goal status: INITIAL  5.  Will be compliant with appropriate gym based exercise program by time of DC  Baseline:  Goal status: reports going to gym some with his son , using bicycle.     PLAN:  PT FREQUENCY: 2x/week  PT DURATION: 12 weeks  PLANNED INTERVENTIONS: 97750- Physical Performance Testing, 97110-Therapeutic exercises, 97530- Therapeutic activity, [id number]- Neuromuscular re-education, 97535- Self Care, [id number]- Manual therapy, and [id number]- Gait training  PLAN FOR NEXT SESSION: progress func strength. See if pt got ankle wts and doing leg ex at home [employer]ig [employer], PT, DPT, OCS 10/16/23 4:06 PM

## 2023-10-17 DIAGNOSIS — D631 Anemia in chronic kidney disease: Secondary | ICD-10-CM | POA: Diagnosis not present

## 2023-10-21 DIAGNOSIS — D631 Anemia in chronic kidney disease: Secondary | ICD-10-CM | POA: Diagnosis not present

## 2023-10-21 DIAGNOSIS — D689 Coagulation defect, unspecified: Secondary | ICD-10-CM | POA: Diagnosis not present

## 2023-10-21 DIAGNOSIS — N186 End stage renal disease: Secondary | ICD-10-CM | POA: Diagnosis not present

## 2023-10-21 DIAGNOSIS — D509 Iron deficiency anemia, unspecified: Secondary | ICD-10-CM | POA: Diagnosis not present

## 2023-10-21 DIAGNOSIS — Z992 Dependence on renal dialysis: Secondary | ICD-10-CM | POA: Diagnosis not present

## 2023-10-21 DIAGNOSIS — R197 Diarrhea, unspecified: Secondary | ICD-10-CM | POA: Diagnosis not present

## 2023-10-22 ENCOUNTER — Encounter: Payer: Self-pay | Admitting: Physical Therapy

## 2023-10-22 ENCOUNTER — Ambulatory Visit: Admitting: Physical Therapy

## 2023-10-22 DIAGNOSIS — M6281 Muscle weakness (generalized): Secondary | ICD-10-CM | POA: Diagnosis not present

## 2023-10-22 DIAGNOSIS — G8929 Other chronic pain: Secondary | ICD-10-CM | POA: Diagnosis not present

## 2023-10-22 DIAGNOSIS — M25611 Stiffness of right shoulder, not elsewhere classified: Secondary | ICD-10-CM | POA: Diagnosis not present

## 2023-10-22 DIAGNOSIS — R2689 Other abnormalities of gait and mobility: Secondary | ICD-10-CM | POA: Diagnosis not present

## 2023-10-22 DIAGNOSIS — R2681 Unsteadiness on feet: Secondary | ICD-10-CM | POA: Diagnosis not present

## 2023-10-22 DIAGNOSIS — M25511 Pain in right shoulder: Secondary | ICD-10-CM | POA: Diagnosis not present

## 2023-10-22 DIAGNOSIS — M25561 Pain in right knee: Secondary | ICD-10-CM | POA: Diagnosis not present

## 2023-10-22 DIAGNOSIS — R296 Repeated falls: Secondary | ICD-10-CM

## 2023-10-22 DIAGNOSIS — R262 Difficulty in walking, not elsewhere classified: Secondary | ICD-10-CM

## 2023-10-22 NOTE — Therapy (Signed)
 OUTPATIENT PHYSICAL THERAPY LOWER EXTREMITY TREATMENT   Patient Name: Carl Williamson Sr. MRN: [id number] DOB:Mar 15, 1960, 63 y.o., male Today's Date: 10/22/2023  END OF SESSION:  PT End of Session - 10/22/23 1059     Visit Number 7    Date for PT Re-Evaluation 12/05/23    PT Start Time 1100    PT Stop Time 1145    PT Time Calculation (min) 45 min    Activity Tolerance Patient tolerated treatment well    Behavior During Therapy [facility] for tasks assessed/performed           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    Parathyroid  abnormality (HCC)    ectopic parathyroid  gland   Presence of arteriovenous fistula for hemodialysis, primary (HCC)    RUE PER PT RLE   Refusal of blood product    NO WHOLE BLOOD PROUCTS   Renal cell carcinoma (HCC)    s/p hand assisted laparoscopic bilateral nephrectomies 11/29/17, + RCC left   Secondary hyperparathyroidism (HCC)    Seizures (HCC)    one episode in past, due to elevated Potassium 08/02/20- at least 4 years ago   Sleep apnea    doesn't use CPAP anymore since weight loss   Stroke [facility])    no residual   Past Surgical History:  Procedure Laterality Date   ANTERIOR CERVICAL CORPECTOMY N/A 05/25/2023   Procedure: ANTERIOR CERVICAL CORPECTOMY CERVICAL FOUR, CERVICAL THREE - CERVICAL FIVE FUSION;  Surgeon: [name] Shove, MD;  Location: [facility] OR;  Service: Neurosurgery;  Laterality: N/A;   AV FISTULA PLACEMENT Right     right arm   BIOPSY  08/01/2019   Procedure: BIOPSY;  Surgeon: Kristie Lamprey, MD;  Location: [facility] ENDOSCOPY;  Service: Endoscopy;;   BIOPSY  11/18/2020   Procedure: BIOPSY;  Surgeon: Stacia Glendia BRAVO, MD;  Location: [facility] ENDOSCOPY;  Service: Gastroenterology;;   BIOPSY  09/13/2021   Procedure: BIOPSY;  Surgeon: Wilhelmenia Aloha Raddle., MD;  Location: [facility] ENDOSCOPY;  Service: Gastroenterology;;   CATARACT EXTRACTION W/ INTRAOCULAR LENS  IMPLANT, BILATERAL     COLON SURGERY     COLONOSCOPY N/A 08/04/2015   Procedure: COLONOSCOPY;  Surgeon: Gordy CHRISTELLA Starch, MD;  Location: [facility] ENDOSCOPY;  Service: Endoscopy;  Laterality: N/A;   COLONOSCOPY  2017   JMP@ [facility]-good prep-mass -recall 1 yr   COLONOSCOPY N/A 09/13/2021   Procedure: COLONOSCOPY;  Surgeon: Mansouraty, Aloha Raddle., MD;  Location: [facility] ENDOSCOPY;  Service: Gastroenterology;  Laterality: N/A;   COLONOSCOPY WITH PROPOFOL  N/A 11/25/2020   Procedure: COLONOSCOPY WITH PROPOFOL ;  Surgeon: Federico Rosario BROCKS, MD;  Location: [facility] ENDOSCOPY;  Service: Gastroenterology;  Laterality: N/A;   COLONOSCOPY WITH PROPOFOL  N/A 09/23/2021   Procedure: COLONOSCOPY WITH PROPOFOL ;  Surgeon: San Sandor GAILS, DO;  Location: [facility] ENDOSCOPY;  Service: Gastroenterology;  Laterality: N/A;   ENTEROSCOPY N/A 09/23/2021   Procedure: ENTEROSCOPY;  Surgeon: San Sandor GAILS, DO;  Location: [facility] ENDOSCOPY;  Service: Gastroenterology;  Laterality: N/A;  Will place order for video capsule study as we may opt to place it during procedure   ESOPHAGOGASTRODUODENOSCOPY N/A 08/01/2019   Procedure: ESOPHAGOGASTRODUODENOSCOPY (EGD);  Surgeon: Kristie Lamprey, MD;  Location: [facility] ENDOSCOPY;  Service: Endoscopy;  Laterality: N/A;   ESOPHAGOGASTRODUODENOSCOPY N/A 11/18/2020   Procedure: ESOPHAGOGASTRODUODENOSCOPY (EGD);  Surgeon: Stacia Glendia BRAVO, MD;  Location: [facility] ENDOSCOPY;  Service: Gastroenterology;  Laterality: N/A;   ESOPHAGOGASTRODUODENOSCOPY (EGD) WITH PROPOFOL  N/A 08/04/2019   Procedure:  ESOPHAGOGASTRODUODENOSCOPY (EGD) WITH PROPOFOL ;  Surgeon: Leigh Elspeth SQUIBB, MD;  Location: [facility] ENDOSCOPY;  Service: Gastroenterology;  Laterality: N/A;   ESOPHAGOGASTRODUODENOSCOPY (EGD) WITH PROPOFOL  N/A 09/13/2021   Procedure: ESOPHAGOGASTRODUODENOSCOPY (EGD) WITH PROPOFOL ;  Surgeon: Wilhelmenia Aloha Raddle., MD;  Location: [facility] ENDOSCOPY;  Service: Gastroenterology;  Laterality: N/A;   GIVENS CAPSULE STUDY N/A 09/23/2021   Procedure: GIVENS CAPSULE STUDY;  Surgeon: San Sandor GAILS, DO;  Location: [facility] ENDOSCOPY;  Service: Gastroenterology;  Laterality: N/A;   graft left arm Left    for dialysis x 2. Removed   HOT HEMOSTASIS  11/18/2020   Procedure: HOT HEMOSTASIS (ARGON PLASMA COAGULATION/BICAP);  Surgeon: Stacia Glendia BRAVO, MD;  Location: [facility] ENDOSCOPY;  Service: Gastroenterology;;   HOT HEMOSTASIS N/A 09/23/2021   Procedure: HOT HEMOSTASIS (ARGON PLASMA COAGULATION/BICAP);  Surgeon: San Sandor GAILS, DO;  Location: [facility] ENDOSCOPY;  Service: Gastroenterology;  Laterality: N/A;   INSERTION OF DIALYSIS CATHETER     Rt chest   LAPAROSCOPIC RIGHT COLECTOMY N/A 08/05/2015   Procedure: LAPAROSCOPIC RIGHT COLECTOMY- ASCENDING;  Surgeon: Jina Nephew, MD;  Location: [facility] OR;  Service: General;  Laterality: N/A;   MASS EXCISION Left 05/28/2019   Procedure: EXCISION SOFT TISSUE MASS LEFT SHOULDER;  Surgeon: Eletha Boas, MD;  Location: [facility] ORS;  Service: General;  Laterality: Left;   NEPHRECTOMY Bilateral    PARATHYROIDECTOMY N/A 06/12/2016   Procedure: TOTAL PARATHYROIDECTOMY WITH AUTOTRANSPLANTATION TO LEFT FOREARM;  Surgeon: Boas Eletha, MD;  Location: [facility] OR;  Service: General;  Laterality: N/A;   PARATHYROIDECTOMY N/A 10/23/2016   Procedure: PARATHYROIDECTOMY;  Surgeon: Eletha Boas, MD;  Location: [facility] OR;  Service: General;  Laterality: N/A;   REVERSE SHOULDER ARTHROPLASTY Right 08/24/2020   Procedure: REVERSE SHOULDER ARTHROPLASTY;  Surgeon: Cristy Bonner DASEN, MD;  Location: [facility] OR;  Service: Orthopedics;   Laterality: Right;   REVISON OF ARTERIOVENOUS FISTULA Right 07/16/2017   Procedure: REVISION OF ARTERIOVENOUS FISTULA  Right ARM;  Surgeon: Sheree Penne Bruckner, MD;  Location: [facility] OR;  Service: Vascular;  Laterality: Right;   TOTAL HIP ARTHROPLASTY Left 11/14/2020   Procedure: LEFT TOTAL HIP ARTHROPLASTY ANTERIOR APPROACH;  Surgeon: [name] Kay HERO, MD;  Location: [facility] OR;  Service: Orthopedics;  Laterality: Left;   UPPER GASTROINTESTINAL ENDOSCOPY  2021   @ [facility]   Patient Active Problem List   Diagnosis Date Noted   Primary osteoarthritis of left knee 10/03/2023   Right hip pain 07/14/2023   Coping style affecting medical condition 06/05/2023   Spinal cord injury, cervical region, sequela (HCC) 05/30/2023   Cervical vertebral fracture (HCC) 05/25/2023   History of GI bleed 05/25/2023   C4 cervical fracture (HCC) 05/25/2023   Duodenitis    Gastritis and gastroduodenitis    Ischemic colitis (HCC)    Bright red blood per rectum    ABLA (acute blood loss anemia)    Gastric ulcer    Duodenal ulcer  Diverticulosis of colon without hemorrhage    GI bleed 09/17/2021   Acute upper GI bleeding 09/11/2021   Mucosal abnormality of esophagus    Mucosal abnormality of stomach    Mucosal abnormality of duodenum    Upper GI bleed    Left hip pain 11/16/2020   Type 2 diabetes mellitus with other specified complication (HCC) 11/14/2020   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/14/2020   Primary osteoarthritis of left hip 10/07/2020   Status post reverse total arthroplasty of right shoulder 08/24/2020   Diverticulitis 03/08/2020   ESRD (end stage renal disease) (HCC) 03/08/2020   Polyneuropathy in diseases classified elsewhere (HCC) 03/08/2020   Pure hypercholesterolemia 03/08/2020   Sacral back pain 03/08/2020   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 11/10/2019   Acute gastric ulcer with hemorrhage    Acute GI bleeding 07/31/2019   Hypotension of hemodialysis 07/31/2019    Diverticulitis large intestine 07/31/2019   Mass of soft tissue of shoulder 05/26/2019   Awaiting organ transplant status 03/18/2019   Finding of cocaine in blood 02/25/2019   Anaphylactic shock, unspecified, sequela 11/21/2018   Hypercalcemia 10/28/2018   Diarrhea, unspecified 08/08/2018   Hyperkalemia 03/12/2018   Renal cell carcinoma of left kidney (HCC) 12/24/2017   Fluid overload, unspecified 07/06/2017   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   Other specified disorders of kidney and ureter 11/22/2016   Secondary hyperparathyroidism of renal origin (HCC) 10/23/2016   Headache disorder 08/21/2016   TIA (transient ischemic attack)    Essential hypertension    Seizure (HCC)    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    Lung nodule    Diverticulitis of cecum 07/21/2015   Diverticulitis of large intestine without perforation or abscess without bleeding    Right lower quadrant pain    Obstructive sleep apnea 02/08/2015   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   Diverticulosis 07/13/2014   Lower GI bleed 07/12/2014   Refusal of blood transfusions as patient is Jehovah's Witness    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   HLD (hyperlipidemia)    PRES (posterior reversible encephalopathy syndrome)    History of CVA (cerebrovascular accident) 05/23/2014   Anemia of chronic disease 05/23/2014   GERD (gastroesophageal reflux disease) 12/09/2012   Gastrointestinal bleeding 11/12/2012   Melena 11/12/2012   Other psychoactive substance dependence, uncomplicated (HCC) 10/08/2012   Iron deficiency anemia, unspecified 06/10/2012   Moderate protein-calorie malnutrition (HCC) 07/29/2009   Coagulation defect, unspecified (HCC) 08/29/2006   Type 2 diabetes mellitus with diabetic peripheral angiopathy without gangrene (HCC) 08/29/2006   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 6-7/10 R ankle  PRECAUTIONS:  Precautions:  Precautions Precautions: Fall, Cervical, Other (comment) Recall of Precautions/Restrictions: Impaired Precaution/Restrictions Comments: Verbally reviewed cervical precautions. Required Braces or Orthoses: Cervical Brace Cervical Brace: Soft collar, At all times  Restrictions Weight Bearing Restrictions Per Provider Order: No General: Pain: No c/o pain.   RED FLAGS: None   WEIGHT BEARING RESTRICTIONS: No  FALLS:  Has patient fallen in last 6 months? Yes. Number of falls 3- one that led to surgery, then 2 after getting home from the hospital  LIVING ENVIRONMENT: Lives with: lives with their son Lives in: House/apartment Stairs: ground floor apt  Has following equipment at home: Single point cane, Environmental consultant - 2 wheeled, and Environmental consultant - 4 wheeled  OCCUPATION: retired- used to pressure wash   PLOF: Independent, Independent with basic ADLs, Independent with gait, and Independent with transfers  PATIENT GOALS: get back to normal ability, drive convertible again   NEXT MD VISIT: Dr. Cornelio 11/15/23  OBJECTIVE:  Note: Objective measures were completed at Evaluation unless otherwise noted.  DIAGNOSTIC FINDINGS:    CLINICAL DATA:  63 year old male with altered mental status.   EXAM: CT HEAD WITHOUT CONTRAST    TECHNIQUE: Contiguous axial images were obtained from the base of the skull through the vertex without intravenous contrast.   RADIATION DOSE REDUCTION: This exam was performed according to the departmental dose-optimization program which includes automated exposure control, adjustment of the mA and/or kV according to patient size and/or use of iterative reconstruction technique.   COMPARISON:  Brain MRI and head CT 05/25/2023.   FINDINGS: Brain:   Unusual extensive, diffuse pachymeningeal calcification which has been present since at least 2013. Stable cerebral volume. No midline shift, ventriculomegaly, mass effect, evidence of mass lesion, intracranial hemorrhage or evidence of cortically based acute infarction. Small area of chronic encephalomalacia in the medial left occipital lobe redemonstrated. Stable gray-white matter differentiation throughout the brain.   Vascular: Chronic severe pachymeningeal calcification.   Skull: Appears stable.  No acute osseous abnormality identified.   Sinuses/Orbits: Continued left middle ear and mastoid opacification. Other Visualized paranasal sinuses and mastoids are stable and well aerated.   Other: Anterior left scalp soft tissue injury has regressed since earlier in the month. Orbits soft tissues appears stable.   IMPRESSION: 1. No acute intracranial abnormality. 2. 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 3. Ongoing left middle ear and mastoid opacification. No acute osseous abnormality identified. 4. Regressed scalp soft tissue injury since 05/25/2023.  CLINICAL DATA:  Chronic bilateral knee pain and swelling.   EXAM: RIGHT KNEE - COMPLETE 4+ VIEW; LEFT KNEE - COMPLETE 4+ VIEW   COMPARISON:  Right knee radiographs dated 12/26/2021.   FINDINGS: Right knee:   No acute fracture or dislocation. There is a small to moderate-sized joint effusion. Moderate  tricompartmental degenerative changes with joint space narrowing and marginal osteophytosis. Vascular calcifications are noted.   Left knee:   No acute fracture or dislocation. There is a small joint effusion. Moderate tricompartmental degenerative changes with joint space narrowing and marginal osteophytosis. Vascular calcifications are noted.   IMPRESSION: 1. No acute osseous abnormality. 2. 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  Narrative & Impression  CLINICAL DATA:  Chronic bilateral knee pain and swelling.   EXAM: RIGHT KNEE - COMPLETE 4+ VIEW; LEFT KNEE - COMPLETE 4+ VIEW   COMPARISON:  Right knee radiographs dated 12/26/2021.   FINDINGS: Right knee:   No acute fracture or dislocation. There is a small to moderate-sized joint effusion. Moderate tricompartmental degenerative changes with joint space narrowing and marginal osteophytosis. Vascular calcifications are noted.   Left knee:   No acute fracture or  dislocation. There is a small joint effusion. Moderate tricompartmental degenerative changes with joint space narrowing and marginal osteophytosis. Vascular calcifications are noted.   IMPRESSION: 1. No acute osseous abnormality. 2. 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    Narrative & Impression CLINICAL DATA:  Right hip pain.   EXAM: DG HIP (WITH OR WITHOUT PELVIS) 2-3V RIGHT   COMPARISON:  None Available.   FINDINGS: No acute fracture or dislocation. The bones are osteopenic. Cystic changes of the right femoral head and neck measure up to 3.7 cm. Orthopedic referral is advised. Mild arthritic changes of the right hip. Total left hip arthroplasty. The soft tissues are unremarkable. Vascular calcifications.   IMPRESSION: 1. No acute fracture or dislocation. 2. Cystic changes of the  right femoral head and neck. Orthopedic referral is advised.  Lower Venous DVT Study   Patient Name:  Carl Mckay Sr.  Date of Exam:   06/10/2023  Medical Rec #: [id number]            Accession #:    [id number]  Date of Birth: 1960/04/10             Patient Gender: M  Patient Age:   8 years  Exam Location:  [facility]  Procedure:      VAS US  LOWER EXTREMITY VENOUS (DVT)  Referring Phys: PRENTICE COMPTON    ---------------------------------------------------------------------------  -----    Indications: Edema.    Risk Factors: Surgery Cervical corpectomy 05/26/23.  Comparison Study: No significant changes seen since previous exam 07/04/06.   Performing Technologist: Garnette Rockers     Examination Guidelines:  A complete evaluation includes B-mode imaging, spectral Doppler, color  Doppler,  and power Doppler as needed of all accessible portions of each vessel.  Bilateral  testing is considered an integral part of a complete examination. Limited  examinations for reoccurring indications may be performed as noted. The  reflux  portion of the exam is performed with the patient in reverse  Trendelenburg.      +---------+---------------+---------+-----------+----------+--------------+   RIGHT   CompressibilityPhasicitySpontaneityPropertiesThrombus  Aging  +---------+---------------+---------+-----------+----------+--------------+   CFV     Full           Yes      Yes                                   +---------+---------------+---------+-----------+----------+--------------+   SFJ     Full                                                          +---------+---------------+---------+-----------+----------+--------------+   FV Prox  Full                                                          +---------+---------------+---------+-----------+----------+--------------+   FV Mid   Full                                                           +---------+---------------+---------+-----------+----------+--------------+  FV DistalFull                                                          +---------+---------------+---------+-----------+----------+--------------+   PFV     Full                                                          +---------+---------------+---------+-----------+----------+--------------+   POP     Full           Yes      Yes                                   +---------+---------------+---------+-----------+----------+--------------+   PTV     Full                    Yes                                   +---------+---------------+---------+-----------+----------+--------------+   PERO    Full                    Yes                                   +---------+---------------+---------+-----------+----------+--------------+          +---------+---------------+---------+-----------+----------+--------------+   LEFT    CompressibilityPhasicitySpontaneityPropertiesThrombus  Aging  +---------+---------------+---------+-----------+----------+--------------+   CFV     Full           Yes      Yes                                   +---------+---------------+---------+-----------+----------+--------------+   SFJ     Full                                                          +---------+---------------+---------+-----------+----------+--------------+   FV Prox  Full                                                          +---------+---------------+---------+-----------+----------+--------------+   FV Mid   Full                                                          +---------+---------------+---------+-----------+----------+--------------+   FV DistalFull                                                          +---------+---------------+---------+-----------+----------+--------------+  PFV      Full                                                          +---------+---------------+---------+-----------+----------+--------------+   POP     Full           Yes      Yes                                   +---------+---------------+---------+-----------+----------+--------------+   PTV     Full                    Yes                                   +---------+---------------+---------+-----------+----------+--------------+   PERO    Full                    Yes                                   +---------+---------------+---------+-----------+----------+--------------+               Summary:  BILATERAL:  - No evidence of deep vein thrombosis seen in the lower extremities,  bilaterally.  -No evidence of popliteal cyst, bilaterally.    PATIENT SURVEYS:  PSFS: THE PATIENT SPECIFIC FUNCTIONAL SCALE  Place score of 0-10 (0 = unable to perform activity and 10 = able to perform activity at the same level as before injury or problem)  Activity Date: eval 09/12/23    Getting in and out of car (anything lower than 20 inches)  0    2. Balance  3    3. Walking without a device  2    4.  Social activities  0    Total Score 1.25      Total Score = Sum of activity scores/number of activities  Minimally Detectable Change: 3 points (for single activity); 2 points (for average score)  [employer] [facility] (nd). The Patient Specific Functional Scale . Retrieved from [email or web address]   COGNITION: Overall cognitive status: Within functional limits for tasks assessed     SENSATION: Hx of neuropathy even before fall/surgery     LOWER EXTREMITY MMT:  MMT Right eval Left eval  Hip flexion 3- 3+  Hip extension    Hip abduction    Hip adduction    Hip internal rotation    Hip external rotation    Knee flexion 3- 4+  Knee extension 3- 5  Ankle dorsiflexion 4 5  Ankle plantarflexion     Ankle inversion    Ankle eversion     (Blank rows = not tested)    GAIT: Distance walked: in clinic distances  Assistive device utilized: Walker - 2 wheeled Level of assistance: Modified independence Comments: slow but steady with RW  TREATMENT DATE:  10/22/23 Nustep L 5, 6 min Standing reaching outside base of support  Side steps at mat able  Leg press 40lb 2x10   SL 20lb x10 each assist need with RLE  Gait 60ft CGA cues for erect posture   Then 28ft LAQ 2lb 2x10  Eccentrics with RLE  10/16/23:  Nustep L 5, 6 min Lat rows(airex in seat) 20# 2 x 10 Rows standing 15# 2 x 10 Knee ext B 5# 2 x 10 Knee flexion 25# 2 x 10, 35# 1 x 10 Sit to stand from elevated mat 7 x Chest presses for, side, side 10 x each blue mediball   10/10/23 Nustep L 5 Green tband HS curls 2 sets 10 3# LAQ 2 sets 10 3# standing with RW 10 x BIL-marching,hip flex,ext ,abd and HS curl HHA 3# marching fwd and back 15 feet 2 x each HHA 3# side stepping 15 feet 2 x each Leg Press 20# BIL 10 x seat on 9, SL 10 x each 20 #  left leg seat on 11, RT needs assist to start mvmt. 20# BIL 10 x seat 8  10/08/23 Nustep L 5 Green tband HS curl 2 sets 10 2# LAQ 2 sets 10- quad lag BIL RT > left Standing with RW 2# marching,SL hip flex,ext and abd 20 x alt STS elevated mat 27.5  inches 2 sets 5 hands on knee, min A Amb with SPC CGA 100 feet with 2 slight sways but reqained Step up 4 in with RW 10 x BIL Step up 4 in with airex min A on RT 2 sets 5, left 10 x      10/01/23 UBE L 3 2 min fwd and back NuStep L 5 x 6 min STS elevated mat 27.5  inches 2 sets 5 hands on knee, CGA SLS HS curls 10 x 15# RT, left 10x 20#, BIL 25# 10 x Knee ext 5# LLE 2 sets 5 RT knee LAQ 3 sets 5     09/25/23 NuStep L 5 x 6 min LAQ RLE no weight, LLE 3lb cuff 2x10  Seated match 3lb 2x10 HS  curls green 2x15 Sit to stand form elevated surface 2x5 with UE use Side steps at mat  Standing march in [facility]  09/12/23  Eval, POC      PATIENT EDUCATION:  Education details: exam findings, POC Person educated: Patient Education method: Explanation Education comprehension: verbalized understanding, returned demonstration, and needs further education  HOME EXERCISE PROGRAM: Educ on getting ankle wts, adjustable up to 5 is rec as we used 3 #- okayed doing seated and standing ex with RW  ASSESSMENT:  CLINICAL IMPRESSION: 10/22/23: Patient without further falls, progressed with strengthening and functional activities.  Gait without AD decrease step length and heel strike. Cues to shift weight to R side when standing. Good effort during session despite reports of fatigue. Will continue with physical therapy for purposeful progression with LE strength and function.  Patient is a 63 y.o. M who was seen today for physical therapy evaluation and treatment for S14.109S (ICD-10-CM) - Spinal cord injury, cervical region, [facility] ([facility]) Pt tolerated a initial progression to therapy well. RLE is significantly weaker than R. Pt had log legs and a relatively short torso making it difficult to stand from low surfaces in conjunction with weak LE. Decrease coordination with side steps. No pain during session. Limited ROM with RLE during isolated exercises.  Will make every effort to improve function and work towards  goals.   OBJECTIVE IMPAIRMENTS: Abnormal gait, decreased activity tolerance, decreased balance, decreased coordination, decreased knowledge of use of DME, decreased mobility, difficulty walking, decreased strength, postural dysfunction, and pain.   ACTIVITY LIMITATIONS: standing, squatting, stairs, transfers, bed mobility, toileting, dressing, and locomotion level  PARTICIPATION LIMITATIONS: shopping, community activity, and yard work  PERSONAL FACTORS: Age, Behavior pattern, Education, Fitness,  Past/current experiences, Profession, Social background, and Time since onset of injury/illness/exacerbation are also affecting patient's functional outcome.   REHAB POTENTIAL: Good  CLINICAL DECISION MAKING: Evolving/moderate complexity  EVALUATION COMPLEXITY: Moderate   GOALS: Goals reviewed with patient? No  SHORT TERM GOALS: Target date: 10/24/2023   Will be compliant with appropriate progressive HEP  Baseline: Goal status: 10/08/23 MET  2.  Will be able to consistently perform functional STS tranfers from surfaces 20 inches or less  Baseline:  Goal status:10/08/23 on going  3.  Will be able to ambulate at least 350ft with quad cane no more than light MinA  Baseline:  Goal status: 10/08/23 progressing  4.  MMT to be at least 3+/5 in all tested groups R LE  Baseline:  Goal status: INITIAL    LONG TERM GOALS: Target date: 12/05/2023      R LE MMT to be at least 4-/5 in all tested groups  Baseline:  Goal status: INITIAL  2.  Will score at least 18 on DGI with no device to show reduced fall risk  Baseline:  Goal status: INITIAL  3.  Will be able to reciprocally ascend/descend steps without difficulty and U rail  Baseline:  Goal status: INITIAL  4.  Will be able to ambulate community distances with LRAD, Mod(I) to allow for improved community access and participating in social activities  Baseline:  Goal status: INITIAL  5.  Will be compliant with appropriate gym based exercise program by time of DC  Baseline:  Goal status: reports going to gym some with his son , using bicycle.     PLAN:  PT FREQUENCY: 2x/week  PT DURATION: 12 weeks  PLANNED INTERVENTIONS: 97750- Physical Performance Testing, 97110-Therapeutic exercises, 97530- Therapeutic activity, [id number]- Neuromuscular re-education, 97535- Self Care, [id number]- Manual therapy, and [id number]- Gait training  PLAN FOR NEXT SESSION: progress func strength. See if pt got ankle wts and doing leg ex at home Amy  Speaks, PT, DPT, OCS 10/22/23 11:00 AM

## 2023-10-24 ENCOUNTER — Ambulatory Visit: Admitting: Physical Therapy

## 2023-10-24 ENCOUNTER — Encounter: Payer: Self-pay | Admitting: Physical Therapy

## 2023-10-24 DIAGNOSIS — R262 Difficulty in walking, not elsewhere classified: Secondary | ICD-10-CM

## 2023-10-24 DIAGNOSIS — M6281 Muscle weakness (generalized): Secondary | ICD-10-CM | POA: Diagnosis not present

## 2023-10-24 DIAGNOSIS — R2689 Other abnormalities of gait and mobility: Secondary | ICD-10-CM | POA: Diagnosis not present

## 2023-10-24 DIAGNOSIS — M25561 Pain in right knee: Secondary | ICD-10-CM | POA: Diagnosis not present

## 2023-10-24 DIAGNOSIS — M25611 Stiffness of right shoulder, not elsewhere classified: Secondary | ICD-10-CM | POA: Diagnosis not present

## 2023-10-24 DIAGNOSIS — G8929 Other chronic pain: Secondary | ICD-10-CM

## 2023-10-24 DIAGNOSIS — R296 Repeated falls: Secondary | ICD-10-CM | POA: Diagnosis not present

## 2023-10-24 DIAGNOSIS — R2681 Unsteadiness on feet: Secondary | ICD-10-CM

## 2023-10-24 DIAGNOSIS — M25511 Pain in right shoulder: Secondary | ICD-10-CM | POA: Diagnosis not present

## 2023-10-24 NOTE — Therapy (Signed)
 OUTPATIENT PHYSICAL THERAPY LOWER EXTREMITY TREATMENT   Patient Name: Carl Chagoya Sr. MRN: [id number] DOB:[birth year], 63 y.o., male Today's Date: 10/24/2023  END OF SESSION:  PT End of Session - 10/24/23 0852     Visit Number 8    Date for PT Re-Evaluation 12/05/23    PT Start Time 0852    PT Stop Time 0930    PT Time Calculation (min) 38 min    Activity Tolerance Patient tolerated treatment well    Behavior During Therapy [facility] for tasks assessed/performed           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    Parathyroid  abnormality (HCC)    ectopic parathyroid  gland   Presence of arteriovenous fistula for hemodialysis, primary (HCC)    RUE PER PT RLE   Refusal of blood product    NO WHOLE BLOOD PROUCTS   Renal cell carcinoma (HCC)    s/p hand assisted laparoscopic bilateral nephrectomies 11/29/17, + RCC left   Secondary hyperparathyroidism (HCC)    Seizures (HCC)    one episode in past, due to elevated Potassium 08/02/20- at least 4 years ago   Sleep apnea    doesn't use CPAP anymore since weight loss   Stroke [facility])    no residual   Past Surgical History:  Procedure Laterality Date   ANTERIOR CERVICAL CORPECTOMY N/A 05/25/2023   Procedure: ANTERIOR CERVICAL CORPECTOMY CERVICAL FOUR, CERVICAL THREE - CERVICAL FIVE FUSION;  Surgeon: [name] Shove, MD;  Location: [facility] OR;  Service: Neurosurgery;  Laterality: N/A;   AV FISTULA PLACEMENT Right     right arm   BIOPSY  08/01/2019   Procedure: BIOPSY;  Surgeon: Kristie Lamprey, MD;  Location: [facility] ENDOSCOPY;  Service: Endoscopy;;   BIOPSY  11/18/2020   Procedure: BIOPSY;  Surgeon: Stacia Glendia BRAVO, MD;  Location: [facility] ENDOSCOPY;  Service: Gastroenterology;;   BIOPSY  09/13/2021   Procedure: BIOPSY;  Surgeon: Wilhelmenia Aloha Raddle., MD;  Location: [facility] ENDOSCOPY;  Service: Gastroenterology;;   CATARACT EXTRACTION W/ INTRAOCULAR LENS  IMPLANT, BILATERAL     COLON SURGERY     COLONOSCOPY N/A 08/04/2015   Procedure: COLONOSCOPY;  Surgeon: Gordy CHRISTELLA Starch, MD;  Location: [facility] ENDOSCOPY;  Service: Endoscopy;  Laterality: N/A;   COLONOSCOPY  2017   JMP@ [facility]-good prep-mass -recall 1 yr   COLONOSCOPY N/A 09/13/2021   Procedure: COLONOSCOPY;  Surgeon: Mansouraty, Aloha Raddle., MD;  Location: [facility] ENDOSCOPY;  Service: Gastroenterology;  Laterality: N/A;   COLONOSCOPY WITH PROPOFOL  N/A 11/25/2020   Procedure: COLONOSCOPY WITH PROPOFOL ;  Surgeon: Federico Rosario BROCKS, MD;  Location: [facility] ENDOSCOPY;  Service: Gastroenterology;  Laterality: N/A;   COLONOSCOPY WITH PROPOFOL  N/A 09/23/2021   Procedure: COLONOSCOPY WITH PROPOFOL ;  Surgeon: San Sandor GAILS, DO;  Location: [facility] ENDOSCOPY;  Service: Gastroenterology;  Laterality: N/A;   ENTEROSCOPY N/A 09/23/2021   Procedure: ENTEROSCOPY;  Surgeon: San Sandor GAILS, DO;  Location: [facility] ENDOSCOPY;  Service: Gastroenterology;  Laterality: N/A;  Will place order for video capsule study as we may opt to place it during procedure   ESOPHAGOGASTRODUODENOSCOPY N/A 08/01/2019   Procedure: ESOPHAGOGASTRODUODENOSCOPY (EGD);  Surgeon: Kristie Lamprey, MD;  Location: [facility] ENDOSCOPY;  Service: Endoscopy;  Laterality: N/A;   ESOPHAGOGASTRODUODENOSCOPY N/A 11/18/2020   Procedure: ESOPHAGOGASTRODUODENOSCOPY (EGD);  Surgeon: Stacia Glendia BRAVO, MD;  Location: [facility] ENDOSCOPY;  Service: Gastroenterology;  Laterality: N/A;   ESOPHAGOGASTRODUODENOSCOPY (EGD) WITH PROPOFOL  N/A 08/04/2019   Procedure:  ESOPHAGOGASTRODUODENOSCOPY (EGD) WITH PROPOFOL ;  Surgeon: Leigh Elspeth SQUIBB, MD;  Location: [facility] ENDOSCOPY;  Service: Gastroenterology;  Laterality: N/A;   ESOPHAGOGASTRODUODENOSCOPY (EGD) WITH PROPOFOL  N/A 09/13/2021   Procedure: ESOPHAGOGASTRODUODENOSCOPY (EGD) WITH PROPOFOL ;  Surgeon: Wilhelmenia Aloha Raddle., MD;  Location: [facility] ENDOSCOPY;  Service: Gastroenterology;  Laterality: N/A;   GIVENS CAPSULE STUDY N/A 09/23/2021   Procedure: GIVENS CAPSULE STUDY;  Surgeon: San Sandor GAILS, DO;  Location: [facility] ENDOSCOPY;  Service: Gastroenterology;  Laterality: N/A;   graft left arm Left    for dialysis x 2. Removed   HOT HEMOSTASIS  11/18/2020   Procedure: HOT HEMOSTASIS (ARGON PLASMA COAGULATION/BICAP);  Surgeon: Stacia Glendia BRAVO, MD;  Location: [facility] ENDOSCOPY;  Service: Gastroenterology;;   HOT HEMOSTASIS N/A 09/23/2021   Procedure: HOT HEMOSTASIS (ARGON PLASMA COAGULATION/BICAP);  Surgeon: San Sandor GAILS, DO;  Location: [facility] ENDOSCOPY;  Service: Gastroenterology;  Laterality: N/A;   INSERTION OF DIALYSIS CATHETER     Rt chest   LAPAROSCOPIC RIGHT COLECTOMY N/A 08/05/2015   Procedure: LAPAROSCOPIC RIGHT COLECTOMY- ASCENDING;  Surgeon: Jina Nephew, MD;  Location: [facility] OR;  Service: General;  Laterality: N/A;   MASS EXCISION Left 05/28/2019   Procedure: EXCISION SOFT TISSUE MASS LEFT SHOULDER;  Surgeon: Eletha Boas, MD;  Location: [facility] ORS;  Service: General;  Laterality: Left;   NEPHRECTOMY Bilateral    PARATHYROIDECTOMY N/A 06/12/2016   Procedure: TOTAL PARATHYROIDECTOMY WITH AUTOTRANSPLANTATION TO LEFT FOREARM;  Surgeon: Boas Eletha, MD;  Location[facility] OR;  Service: General;  Laterality: N/A;   PARATHYROIDECTOMY N/A 10/23/2016   Procedure: PARATHYROIDECTOMY;  Surgeon: Eletha Boas, MD;  Location: [facility] OR;  Service: General;  Laterality: N/A;   REVERSE SHOULDER ARTHROPLASTY Right 08/24/2020   Procedure: REVERSE SHOULDER ARTHROPLASTY;  Surgeon: Cristy Bonner DASEN, MD;  Location: [facility] OR;  Service: Orthopedics;   Laterality: Right;   REVISON OF ARTERIOVENOUS FISTULA Right 07/16/2017   Procedure: REVISION OF ARTERIOVENOUS FISTULA  Right ARM;  Surgeon: Sheree Penne Bruckner, MD;  Location: [facility] OR;  Service: Vascular;  Laterality: Right;   TOTAL HIP ARTHROPLASTY Left 11/14/2020   Procedure: LEFT TOTAL HIP ARTHROPLASTY ANTERIOR APPROACH;  Surgeon: [name] Kay HERO, MD;  Location: [facility] OR;  Service: Orthopedics;  Laterality: Left;   UPPER GASTROINTESTINAL ENDOSCOPY  2021   @ [facility]   Patient Active Problem List   Diagnosis Date Noted   Primary osteoarthritis of left knee 10/03/2023   Right hip pain 07/14/2023   Coping style affecting medical condition 06/05/2023   Spinal cord injury, cervical region, sequela (HCC) 05/30/2023   Cervical vertebral fracture (HCC) 05/25/2023   History of GI bleed 05/25/2023   C4 cervical fracture (HCC) 05/25/2023   Duodenitis    Gastritis and gastroduodenitis    Ischemic colitis (HCC)    Bright red blood per rectum    ABLA (acute blood loss anemia)    Gastric ulcer    Duodenal ulcer  Diverticulosis of colon without hemorrhage    GI bleed 09/17/2021   Acute upper GI bleeding 09/11/2021   Mucosal abnormality of esophagus    Mucosal abnormality of stomach    Mucosal abnormality of duodenum    Upper GI bleed    Left hip pain 11/16/2020   Type 2 diabetes mellitus with other specified complication (HCC) 11/14/2020   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/14/2020   Primary osteoarthritis of left hip 10/07/2020   Status post reverse total arthroplasty of right shoulder 08/24/2020   Diverticulitis 03/08/2020   ESRD (end stage renal disease) (HCC) 03/08/2020   Polyneuropathy in diseases classified elsewhere (HCC) 03/08/2020   Pure hypercholesterolemia 03/08/2020   Sacral back pain 03/08/2020   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 11/10/2019   Acute gastric ulcer with hemorrhage    Acute GI bleeding 07/31/2019   Hypotension of hemodialysis 07/31/2019    Diverticulitis large intestine 07/31/2019   Mass of soft tissue of shoulder 05/26/2019   Awaiting organ transplant status 03/18/2019   Finding of cocaine in blood 02/25/2019   Anaphylactic shock, unspecified, sequela 11/21/2018   Hypercalcemia 10/28/2018   Diarrhea, unspecified 08/08/2018   Hyperkalemia 03/12/2018   Renal cell carcinoma of left kidney (HCC) 12/24/2017   Fluid overload, unspecified 07/06/2017   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   Other specified disorders of kidney and ureter 11/22/2016   Secondary hyperparathyroidism of renal origin (HCC) 10/23/2016   Headache disorder 08/21/2016   TIA (transient ischemic attack)    Essential hypertension    Seizure (HCC)    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    Lung nodule    Diverticulitis of cecum 07/21/2015   Diverticulitis of large intestine without perforation or abscess without bleeding    Right lower quadrant pain    Obstructive sleep apnea 02/08/2015   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   Diverticulosis 07/13/2014   Lower GI bleed 07/12/2014   Refusal of blood transfusions as patient is Jehovah's Witness    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   HLD (hyperlipidemia)    PRES (posterior reversible encephalopathy syndrome)    History of CVA (cerebrovascular accident) 05/23/2014   Anemia of chronic disease 05/23/2014   GERD (gastroesophageal reflux disease) 12/09/2012   Gastrointestinal bleeding 11/12/2012   Melena 11/12/2012   Other psychoactive substance dependence, uncomplicated (HCC) 10/08/2012   Iron deficiency anemia, unspecified 06/10/2012   Moderate protein-calorie malnutrition (HCC) 07/29/2009   Coagulation defect, unspecified (HCC) 08/29/2006   Type 2 diabetes mellitus with diabetic peripheral angiopathy without gangrene (HCC) 08/29/2006   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  stage renal disease (HCC) 08/02/2006    PCP: [name] MD   REFERRING PROVIDER: Debby Fidela CROME, NP  REFERRING DIAG:  Diagnosis  S14.109S (ICD-10-CM) - Spinal cord injury, cervical region, sequela (HCC)    THERAPY DIAG:  Difficulty in walking, not elsewhere classified  Muscle weakness (generalized)  Unsteadiness on feet  Repeated falls  Stiffness of right shoulder, not elsewhere classified  Chronic right shoulder pain  Chronic pain of right knee  Other abnormalities of gait and mobility  Rationale for Evaluation and Treatment: Rehabilitation  ONSET DATE:  ACDF 05/25/23  SUBJECTIVE:   SUBJECTIVE STATEMENT: Can't complain  PERTINENT HISTORY: See above   ACDF C3-C5 fusion, anterior cervical corpectomy C3-C4  PAIN:  Are you having pain? 5/10 R ankle  PRECAUTIONS:  Precautions:  Precautions Precautions: Fall,  Cervical, Other (comment) Recall of Precautions/Restrictions: Impaired Precaution/Restrictions Comments: Verbally reviewed cervical precautions. Required Braces or Orthoses: Cervical Brace Cervical Brace: Soft collar, At all times Restrictions Weight Bearing Restrictions Per Provider Order: No General: Pain: No c/o pain.   RED FLAGS: None   WEIGHT BEARING RESTRICTIONS: No  FALLS:  Has patient fallen in last 6 months? Yes. Number of falls 3- one that led to surgery, then 2 after getting home from the hospital  LIVING ENVIRONMENT: Lives with: lives with their son Lives in: House/apartment Stairs: ground floor apt  Has following equipment at home: Single point cane, Environmental consultant - 2 wheeled, and Environmental consultant - 4 wheeled  OCCUPATION: retired- used to pressure wash   PLOF: Independent, Independent with basic ADLs, Independent with gait, and Independent with transfers  PATIENT GOALS: get back to normal ability, drive convertible again   NEXT MD VISIT: Dr. Cornelio 11/15/23  OBJECTIVE:  Note: Objective measures were completed at Evaluation unless  otherwise noted.  DIAGNOSTIC FINDINGS:    CLINICAL DATA:  63 year old male with altered mental status.   EXAM: CT HEAD WITHOUT CONTRAST   TECHNIQUE: Contiguous axial images were obtained from the base of the skull through the vertex without intravenous contrast.   RADIATION DOSE REDUCTION: This exam was performed according to the departmental dose-optimization program which includes automated exposure control, adjustment of the mA and/or kV according to patient size and/or use of iterative reconstruction technique.   COMPARISON:  Brain MRI and head CT 05/25/2023.   FINDINGS: Brain:   Unusual extensive, diffuse pachymeningeal calcification which has been present since at least 2013. Stable cerebral volume. No midline shift, ventriculomegaly, mass effect, evidence of mass lesion, intracranial hemorrhage or evidence of cortically based acute infarction. Small area of chronic encephalomalacia in the medial left occipital lobe redemonstrated. Stable gray-white matter differentiation throughout the brain.   Vascular: Chronic severe pachymeningeal calcification.   Skull: Appears stable.  No acute osseous abnormality identified.   Sinuses/Orbits: Continued left middle ear and mastoid opacification. Other Visualized paranasal sinuses and mastoids are stable and well aerated.   Other: Anterior left scalp soft tissue injury has regressed since earlier in the month. Orbits soft tissues appears stable.   IMPRESSION: 1. No acute intracranial abnormality. 2. 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 3. Ongoing left middle ear and mastoid opacification. No acute osseous abnormality identified. 4. Regressed scalp soft tissue injury since 05/25/2023.  CLINICAL DATA:  Chronic bilateral knee pain and swelling.   EXAM: RIGHT KNEE - COMPLETE 4+ VIEW; LEFT KNEE - COMPLETE 4+ VIEW   COMPARISON:  Right knee radiographs  dated 12/26/2021.   FINDINGS: Right knee:   No acute fracture or dislocation. There is a small to moderate-sized joint effusion. Moderate tricompartmental degenerative changes with joint space narrowing and marginal osteophytosis. Vascular calcifications are noted.   Left knee:   No acute fracture or dislocation. There is a small joint effusion. Moderate tricompartmental degenerative changes with joint space narrowing and marginal osteophytosis. Vascular calcifications are noted.   IMPRESSION: 1. No acute osseous abnormality. 2. 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  Narrative & Impression  CLINICAL DATA:  Chronic bilateral knee pain and swelling.   EXAM: RIGHT KNEE - COMPLETE 4+ VIEW; LEFT KNEE - COMPLETE 4+ VIEW   COMPARISON:  Right knee radiographs dated 12/26/2021.   FINDINGS: Right knee:   No acute fracture or dislocation. There is a small to moderate-sized joint  effusion. Moderate tricompartmental degenerative changes with joint space narrowing and marginal osteophytosis. Vascular calcifications are noted.   Left knee:   No acute fracture or dislocation. There is a small joint effusion. Moderate tricompartmental degenerative changes with joint space narrowing and marginal osteophytosis. Vascular calcifications are noted.   IMPRESSION: 1. No acute osseous abnormality. 2. 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    Narrative & Impression CLINICAL DATA:  Right hip pain.   EXAM: DG HIP (WITH OR WITHOUT PELVIS) 2-3V RIGHT   COMPARISON:  None Available.   FINDINGS: No acute fracture or dislocation. The bones are osteopenic. Cystic changes of the right femoral head and neck measure up to 3.7 cm. Orthopedic referral is advised. Mild arthritic changes of the right hip. Total left hip arthroplasty.  The soft tissues are unremarkable. Vascular calcifications.   IMPRESSION: 1. No acute fracture or dislocation. 2. Cystic changes of the right femoral head and neck. Orthopedic referral is advised.  Lower Venous DVT Study   Patient Name:  Carl Strohmeier Sr.  Date of Exam:   06/10/2023  Medical Rec #: [id number]            Accession #:    [id number]  Date of Birth: 01/16/1961             Patient Gender: M  Patient Age:   57 years  Exam Location:  [facility]  Procedure:      VAS US  LOWER EXTREMITY VENOUS (DVT)  Referring Phys: PRENTICE COMPTON    ---------------------------------------------------------------------------  -----    Indications: Edema.    Risk Factors: Surgery Cervical corpectomy 05/26/23.  Comparison Study: No significant changes seen since previous exam 07/04/06.   Performing Technologist: Garnette Rockers     Examination Guidelines:  A complete evaluation includes B-mode imaging, spectral Doppler, color  Doppler,  and power Doppler as needed of all accessible portions of each vessel.  Bilateral  testing is considered an integral part of a complete examination. Limited  examinations for reoccurring indications may be performed as noted. The  reflux  portion of the exam is performed with the patient in reverse  Trendelenburg.      +---------+---------------+---------+-----------+----------+--------------+   RIGHT   CompressibilityPhasicitySpontaneityPropertiesThrombus  Aging  +---------+---------------+---------+-----------+----------+--------------+   CFV     Full           Yes      Yes                                   +---------+---------------+---------+-----------+----------+--------------+   SFJ     Full                                                          +---------+---------------+---------+-----------+----------+--------------+   FV Prox  Full                                                           +---------+---------------+---------+-----------+----------+--------------+   FV Mid   Full                                                          +---------+---------------+---------+-----------+----------+--------------+  FV DistalFull                                                          +---------+---------------+---------+-----------+----------+--------------+   PFV     Full                                                          +---------+---------------+---------+-----------+----------+--------------+   POP     Full           Yes      Yes                                   +---------+---------------+---------+-----------+----------+--------------+   PTV     Full                    Yes                                   +---------+---------------+---------+-----------+----------+--------------+   PERO    Full                    Yes                                   +---------+---------------+---------+-----------+----------+--------------+          +---------+---------------+---------+-----------+----------+--------------+   LEFT    CompressibilityPhasicitySpontaneityPropertiesThrombus  Aging  +---------+---------------+---------+-----------+----------+--------------+   CFV     Full           Yes      Yes                                   +---------+---------------+---------+-----------+----------+--------------+   SFJ     Full                                                          +---------+---------------+---------+-----------+----------+--------------+   FV Prox  Full                                                          +---------+---------------+---------+-----------+----------+--------------+   FV Mid   Full                                                          +---------+---------------+---------+-----------+----------+--------------+   FV DistalFull                                                           +---------+---------------+---------+-----------+----------+--------------+  PFV     Full                                                          +---------+---------------+---------+-----------+----------+--------------+   POP     Full           Yes      Yes                                   +---------+---------------+---------+-----------+----------+--------------+   PTV     Full                    Yes                                   +---------+---------------+---------+-----------+----------+--------------+   PERO    Full                    Yes                                   +---------+---------------+---------+-----------+----------+--------------+               Summary:  BILATERAL:  - No evidence of deep vein thrombosis seen in the lower extremities,  bilaterally.  -No evidence of popliteal cyst, bilaterally.    PATIENT SURVEYS:  PSFS: THE PATIENT SPECIFIC FUNCTIONAL SCALE  Place score of 0-10 (0 = unable to perform activity and 10 = able to perform activity at the same level as before injury or problem)  Activity Date: eval 09/12/23    Getting in and out of car (anything lower than 20 inches)  0    2. Balance  3    3. Walking without a device  2    4.  Social activities  0    Total Score 1.25      Total Score = Sum of activity scores/number of activities  Minimally Detectable Change: 3 points (for single activity); 2 points (for average score)  [employer] [facility] (nd). The Patient Specific Functional Scale . Retrieved from [email or web address]   COGNITION: Overall cognitive status: Within functional limits for tasks assessed     SENSATION: Hx of neuropathy even before fall/surgery     LOWER EXTREMITY MMT:  MMT Right eval Left eval Right 10/24/23 Left 10/24/23  Hip flexion 3- 3+ 3 3+  Hip extension       Hip abduction      Hip adduction      Hip internal rotation      Hip external rotation      Knee flexion 3- 4+ 4- 5  Knee extension 3- 5 3- 5  Ankle dorsiflexion 4 5 5 5   Ankle plantarflexion      Ankle inversion      Ankle eversion       (Blank rows = not tested)    GAIT: Distance walked: in clinic distances  Assistive device utilized: Environmental consultant - 2 wheeled Level of assistance: Modified independence Comments: slow but steady with RW  TREATMENT DATE:  10/24/23 NuStep L 5 x 6 min Goals  Stairs heavy UE use come SGA alt pattern 2 rails Sit to stand from elevated surface with compensation due to weakness LAQ 2lb 2x10  Eccentrics with RLE Gait 50 feet No AD CGA   Decrease heel strike and step length   10/22/23 Nustep L 5, 6 min Standing reaching outside base of support  Side steps at mat able  Leg press 40lb 2x10   SL 20lb x10 each assist need with RLE  Gait 17ft CGA cues for erect posture   Then 35ft LAQ 2lb 2x10  Eccentrics with RLE  10/16/23:  Nustep L 5, 6 min Lat rows(airex in seat) 20# 2 x 10 Rows standing 15# 2 x 10 Knee ext B 5# 2 x 10 Knee flexion 25# 2 x 10, 35# 1 x 10 Sit to stand from elevated mat 7 x Chest presses for, side, side 10 x each blue mediball   10/10/23 Nustep L 5 Green tband HS curls 2 sets 10 3# LAQ 2 sets 10 3# standing with RW 10 x BIL-marching,hip flex,ext ,abd and HS curl HHA 3# marching fwd and back 15 feet 2 x each HHA 3# side stepping 15 feet 2 x each Leg Press 20# BIL 10 x seat on 9, SL 10 x each 20 #  left leg seat on 11, RT needs assist to start mvmt. 20# BIL 10 x seat 8  10/08/23 Nustep L 5 Green tband HS curl 2 sets 10 2# LAQ 2 sets 10- quad lag BIL RT > left Standing with RW 2# marching,SL hip flex,ext and abd 20 x alt STS elevated mat 27.5  inches 2 sets 5 hands on knee, min A Amb with  SPC CGA 100 feet with 2 slight sways but reqained Step up 4 in with RW 10 x BIL Step up 4 in with airex min A on RT 2 sets 5, left 10 x   10/01/23 UBE L 3 2 min fwd and back NuStep L 5 x 6 min STS elevated mat 27.5  inches 2 sets 5 hands on knee, CGA SLS HS curls 10 x 15# RT, left 10x 20#, BIL 25# 10 x Knee ext 5# LLE 2 sets 5 RT knee LAQ 3 sets 5     09/25/23 NuStep L 5 x 6 min LAQ RLE no weight, LLE 3lb cuff 2x10  Seated match 3lb 2x10 HS curls green 2x15 Sit to stand form elevated surface 2x5 with UE use Side steps at mat  Standing march in [facility]  09/12/23  Eval, POC      PATIENT EDUCATION:  Education details: exam findings, POC Person educated: Patient Education method: Explanation Education comprehension: verbalized understanding, returned demonstration, and needs further education  HOME EXERCISE PROGRAM: Educ on getting ankle wts, adjustable up to 5 is rec as we used 3 #- okayed doing seated and standing ex with RW  ASSESSMENT:  CLINICAL IMPRESSION: 10/24/23: Patient without further falls, Slight increase has been made in LE's. Gait without AD decrease step length and heel strike. Cues to shift weight to R side when standing. Good effort during session despite reports of fatigue. Some reps of sit tos stand completed without LE bracing against table. Will continue with physical therapy for purposeful progression with LE strength and function.   Patient is a 63 y.o. M who was seen today for physical therapy evaluation and treatment for S14.109S (ICD-10-CM) - Spinal cord injury, cervical region, sequela (HCC)  Pt tolerated a initial progression to therapy well. RLE is significantly weaker than R. Pt had log legs and a relatively short torso making it difficult to stand from low surfaces in conjunction with weak LE. Decrease coordination with side steps. No pain during session. Limited ROM with RLE during isolated exercises.  Will make every effort to improve function and  work towards goals.   OBJECTIVE IMPAIRMENTS: Abnormal gait, decreased activity tolerance, decreased balance, decreased coordination, decreased knowledge of use of DME, decreased mobility, difficulty walking, decreased strength, postural dysfunction, and pain.   ACTIVITY LIMITATIONS: standing, squatting, stairs, transfers, bed mobility, toileting, dressing, and locomotion level  PARTICIPATION LIMITATIONS: shopping, community activity, and yard work  PERSONAL FACTORS: Age, Behavior pattern, Education, Fitness, Past/current experiences, Profession, Social background, and Time since onset of injury/illness/exacerbation are also affecting patient's functional outcome.   REHAB POTENTIAL: Good  CLINICAL DECISION MAKING: Evolving/moderate complexity  EVALUATION COMPLEXITY: Moderate   GOALS: Goals reviewed with patient? No  SHORT TERM GOALS: Target date: 10/24/2023   Will be compliant with appropriate progressive HEP  Baseline: Goal status: 10/08/23 MET  2.  Will be able to consistently perform functional STS tranfers from surfaces 20 inches or less  Baseline:  Goal status:10/08/23 on going  3.  Will be able to ambulate at least 334ft with quad cane no more than light MinA  Baseline:  Goal status: 10/08/23 progressing  4.  MMT to be at least 3+/5 in all tested groups R LE  Baseline:  Goal status: ongoing 10/24/23    LONG TERM GOALS: Target date: 12/05/2023      R LE MMT to be at least 4-/5 in all tested groups  Baseline:  Goal status: ongoing 10/24/23  2.  Will score at least 18 on DGI with no device to show reduced fall risk  Baseline:  Goal status: INITIAL  3.  Will be able to reciprocally ascend/descend steps without difficulty and U rail  Baseline:  Goal status: Ongoing 10/24/23  4.  Will be able to ambulate community distances with LRAD, Mod(I) to allow for improved community access and participating in social activities  Baseline:  Goal status: INITIAL  5.  Will be  compliant with appropriate gym based exercise program by time of DC  Baseline:  Goal status: reports going to gym some with his son , using bicycle.     PLAN:  PT FREQUENCY: 2x/week  PT DURATION: 12 weeks  PLANNED INTERVENTIONS: 97750- Physical Performance Testing, 97110-Therapeutic exercises, 97530- Therapeutic activity, [id number]- Neuromuscular re-education, 97535- Self Care, [id number]- Manual therapy, and 97116- Gait training  PLAN FOR NEXT SESSION: progress func strength. See if pt got ankle wts and doing leg ex at home Tanda Sorrow, PTA  10/24/23 8:53 AM

## 2023-10-25 DIAGNOSIS — N2581 Secondary hyperparathyroidism of renal origin: Secondary | ICD-10-CM | POA: Diagnosis not present

## 2023-10-25 DIAGNOSIS — Z992 Dependence on renal dialysis: Secondary | ICD-10-CM | POA: Diagnosis not present

## 2023-10-25 DIAGNOSIS — D509 Iron deficiency anemia, unspecified: Secondary | ICD-10-CM | POA: Diagnosis not present

## 2023-10-25 DIAGNOSIS — D631 Anemia in chronic kidney disease: Secondary | ICD-10-CM | POA: Diagnosis not present

## 2023-10-25 DIAGNOSIS — N186 End stage renal disease: Secondary | ICD-10-CM | POA: Diagnosis not present

## 2023-10-25 DIAGNOSIS — D689 Coagulation defect, unspecified: Secondary | ICD-10-CM | POA: Diagnosis not present

## 2023-10-27 ENCOUNTER — Ambulatory Visit
Admission: RE | Admit: 2023-10-27 | Discharge: 2023-10-27 | Disposition: A | Discharge status: Home / Self Care | Source: Ambulatory Visit | Attending: Family Medicine | Admitting: Family Medicine

## 2023-10-27 ENCOUNTER — Ambulatory Visit (INDEPENDENT_AMBULATORY_CARE_PROVIDER_SITE_OTHER)

## 2023-10-27 VITALS — BP 110/67 | HR 92 | Temp 98.2°F | Resp 18

## 2023-10-27 DIAGNOSIS — M25571 Pain in right ankle and joints of right foot: Secondary | ICD-10-CM

## 2023-10-27 DIAGNOSIS — I739 Peripheral vascular disease, unspecified: Secondary | ICD-10-CM | POA: Diagnosis not present

## 2023-10-27 DIAGNOSIS — M7989 Other specified soft tissue disorders: Secondary | ICD-10-CM | POA: Diagnosis not present

## 2023-10-27 NOTE — ED Triage Notes (Signed)
 The patient reports about a week ago he began having right ankle discomfort.   Home interventions: tylenol , ibuprofen, Voltaren

## 2023-10-27 NOTE — Discharge Instructions (Signed)
 Wear compression socks daily. Elevate the legs when possible. Use Tylenol  for pain. Follow up with a vascular clinic to see if they can do more testing and confirm that your circulation may be the source of your right leg pain and swelling.

## 2023-10-27 NOTE — ED Provider Notes (Signed)
 [facility]  Note:  This document was prepared using Conservation officer, historic buildings and may include unintentional dictation errors.  MRN: [id number] DOB: 10-22-1960  Subjective:   Carl Curls Sr. is a 63 y.o. male presenting for 1 week history of persistent right ankle pain over the lateral aspect.  Has had some intermittent swelling.  No fall, trauma.  Patient does have difficulty ambulating and uses a walker.  No changes in that regard.  He is on dialysis, has ESRD.  Patient is a smoker.  Currently does cigars used to smoke cigarettes.  Does not practice a healthy diet.  No current facility-administered medications for this encounter.  Current Outpatient Medications:    acetaminophen  (TYLENOL ) 325 MG tablet, Take 1-2 tablets (325-650 mg total) by mouth every 4 (four) hours as needed for mild pain (pain score 1-3)., Disp: , Rfl:    atorvastatin  (LIPITOR) 10 MG tablet, Take 1 tablet (10 mg total) by mouth every Thursday., Disp: 30 tablet, Rfl: 2   augmented betamethasone  dipropionate (DIPROLENE -AF) 0.05 % ointment, Apply 1 Application topically 2 (two) times daily as needed for rash., Disp: , Rfl:    calcitRIOL  (ROCALTROL ) 0.5 MCG capsule, Take 3 capsules (1.5 mcg total) by mouth every Monday, Wednesday, and Friday with hemodialysis., Disp: , Rfl:    camphor-menthol  (SARNA) lotion, Apply topically., Disp: , Rfl:    cinacalcet  (SENSIPAR ) 60 MG tablet, , Disp: , Rfl:    cromolyn  (OPTICROM ) 4 % ophthalmic solution, Place 1 drop into both eyes daily as needed (redness)., Disp: , Rfl:    cyclobenzaprine  (FLEXERIL ) 10 MG tablet, Take 1 tablet (10 mg total) by mouth 3 (three) times daily as needed for muscle spasms., Disp: 30 tablet, Rfl: 0   diphenhydrAMINE  (BENADRYL ) 25 mg capsule, Take 1 capsule (25 mg total) by mouth every 6 (six) hours as needed for itching., Disp: , Rfl:    gabapentin  (NEURONTIN ) 100 MG capsule, Take 3 capsules (300 mg total) by mouth at bedtime.,  Disp: 30 capsule, Rfl: 2   ibuprofen (ADVIL) 800 MG tablet, SMARTSIG:1 Tablet(s) By Mouth Every 12 Hours PRN, Disp: , Rfl:    LOKELMA  10 g PACK packet, Take 1 packet by mouth daily., Disp: , Rfl:    melatonin 5 MG TABS, Take 1 tablet (5 mg total) by mouth at bedtime as needed., Disp: , Rfl:    Methoxy PEG-Epoetin  Beta (MIRCERA IJ), 75 mcg., Disp: , Rfl:    midodrine  (PROAMATINE ) 10 MG tablet, Take 1 tablet (10 mg total) by mouth every dialysis (30 min before each dialysis.)., Disp: , Rfl:    Nystatin (GERHARDT'S BUTT CREAM) CREA, Apply 1 Application topically 2 (two) times daily., Disp: , Rfl:    oxyCODONE -acetaminophen  (PERCOCET/ROXICET) 5-325 MG tablet, Take 1-2 tablets by mouth every 6 (six) hours as needed for severe pain (pain score 7-10) (5mg  for moderate pain and 10 mg for severe pain)., Disp: 20 tablet, Rfl: 0   pantoprazole  (PROTONIX ) 40 MG tablet, Take 1 tablet (40 mg total) by mouth 2 (two) times daily before a meal. (Patient taking differently: Take 40 mg by mouth daily as needed (for acid reflux).), Disp: 60 tablet, Rfl: 1   pentafluoroprop-tetrafluoroeth (GEBAUERS) AERO, Apply 1 Application topically as needed (topical anesthesia for hemodialysis)., Disp: , Rfl:    polyethylene glycol powder (GLYCOLAX /MIRALAX ) 17 GM/SCOOP powder, Take 17 g by mouth 2 (two) times daily as needed for mild constipation., Disp: , Rfl:    psyllium (HYDROCIL/METAMUCIL) 95 % PACK, Take 1  packet by mouth 3 (three) times daily., Disp: , Rfl:    senna-docusate (SENOKOT-S) 8.6-50 MG tablet, Take 2 tablets by mouth 2 (two) times daily., Disp: 20 tablet, Rfl: 0   sevelamer  carbonate (RENVELA ) 800 MG tablet, Take 3,200-4,000 mg by mouth 3 (three) times daily with meals., Disp: , Rfl:    topiramate  (TOPAMAX ) 25 MG tablet, Take 1 tablet (25 mg total) by mouth 2 (two) times daily., Disp: , Rfl:    Allergies  Allergen Reactions   Infed [Iron Dextran] Other (See Comments)    Decreased BP    Vicodin  [Hydrocodone -Acetaminophen ] Other (See Comments)    Decreased BP   Acetaminophen     Hydrocodone     Diclofenac Other (See Comments)    Caused hypotension    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    Parathyroid  abnormality (HCC)    ectopic parathyroid  gland   Presence of arteriovenous fistula for hemodialysis, primary (HCC)    RUE PER PT RLE   Refusal of blood product    NO WHOLE BLOOD PROUCTS   Renal cell carcinoma (HCC)    s/p hand assisted laparoscopic bilateral nephrectomies 11/29/17, + RCC left   Secondary hyperparathyroidism (HCC)    Seizures (HCC)    one episode in past, due to elevated Potassium 08/02/20- at least 4 years ago   Sleep apnea    doesn't use CPAP anymore since weight loss   Stroke [facility])    no residual     Past Surgical History:  Procedure Laterality Date   ANTERIOR CERVICAL CORPECTOMY N/A 05/25/2023   Procedure: ANTERIOR CERVICAL CORPECTOMY CERVICAL FOUR, CERVICAL THREE - CERVICAL FIVE FUSION;  Surgeon: [name] Shove, MD;  Location: [facility] OR;  Service: Neurosurgery;  Laterality: N/A;   AV FISTULA PLACEMENT Right    right arm   BIOPSY  08/01/2019   Procedure: BIOPSY;  Surgeon: Kristie Lamprey, MD;  Location: [facility] ENDOSCOPY;  Service: Endoscopy;;   BIOPSY  11/18/2020   Procedure: BIOPSY;  Surgeon: Stacia Glendia BRAVO, MD;  Location: [facility] ENDOSCOPY;  Service: Gastroenterology;;   BIOPSY  09/13/2021   Procedure: BIOPSY;  Surgeon: Wilhelmenia Aloha Raddle., MD;  Location: [facility] ENDOSCOPY;  Service: Gastroenterology;;   CATARACT EXTRACTION W/ INTRAOCULAR LENS  IMPLANT, BILATERAL     COLON SURGERY     COLONOSCOPY N/A 08/04/2015   Procedure: COLONOSCOPY;  Surgeon: Gordy CHRISTELLA Starch, MD;  Location: [facility] ENDOSCOPY;  Service: Endoscopy;  Laterality: N/A;   COLONOSCOPY  2017   JMP@ [facility]-good prep-mass -recall 1 yr   COLONOSCOPY N/A 09/13/2021   Procedure: COLONOSCOPY;  Surgeon: Mansouraty, Aloha Raddle., MD;  Location: [facility] ENDOSCOPY;  Service: Gastroenterology;  Laterality: N/A;   COLONOSCOPY WITH PROPOFOL  N/A 11/25/2020   Procedure: COLONOSCOPY WITH PROPOFOL ;  Surgeon: Federico Rosario BROCKS, MD;  Location:  [facility] ENDOSCOPY;  Service: Gastroenterology;  Laterality: N/A;   COLONOSCOPY WITH PROPOFOL  N/A 09/23/2021   Procedure: COLONOSCOPY WITH PROPOFOL ;  Surgeon: San Sandor GAILS, DO;  Location: [facility] ENDOSCOPY;  Service: Gastroenterology;  Laterality: N/A;   ENTEROSCOPY N/A 09/23/2021   Procedure: ENTEROSCOPY;  Surgeon: San Sandor GAILS, DO;  Location: [facility] ENDOSCOPY;  Service: Gastroenterology;  Laterality: N/A;  Will place order for video capsule study as we may opt to place it during procedure   ESOPHAGOGASTRODUODENOSCOPY N/A 08/01/2019   Procedure: ESOPHAGOGASTRODUODENOSCOPY (EGD);  Surgeon: Kristie Lamprey, MD;  Location: [facility] ENDOSCOPY;  Service: Endoscopy;  Laterality: N/A;   ESOPHAGOGASTRODUODENOSCOPY N/A 11/18/2020   Procedure: ESOPHAGOGASTRODUODENOSCOPY (EGD);  Surgeon: Stacia Glendia BRAVO, MD;  Location: [facility] ENDOSCOPY;  Service: Gastroenterology;  Laterality: N/A;   ESOPHAGOGASTRODUODENOSCOPY (EGD) WITH PROPOFOL  N/A 08/04/2019   Procedure: ESOPHAGOGASTRODUODENOSCOPY (EGD) WITH PROPOFOL ;  Surgeon: Leigh Elspeth SQUIBB, MD;  Location: [facility] ENDOSCOPY;  Service: Gastroenterology;  Laterality: N/A;   ESOPHAGOGASTRODUODENOSCOPY (EGD) WITH PROPOFOL  N/A 09/13/2021   Procedure: ESOPHAGOGASTRODUODENOSCOPY (EGD) WITH PROPOFOL ;  Surgeon: Wilhelmenia Aloha Raddle., MD;  Location: [facility]  ENDOSCOPY;  Service: Gastroenterology;  Laterality: N/A;   GIVENS CAPSULE STUDY N/A 09/23/2021   Procedure: GIVENS CAPSULE STUDY;  Surgeon: San Sandor GAILS, DO;  Location: [facility] ENDOSCOPY;  Service: Gastroenterology;  Laterality: N/A;   graft left arm Left    for dialysis x 2. Removed   HOT HEMOSTASIS  11/18/2020   Procedure: HOT HEMOSTASIS (ARGON PLASMA COAGULATION/BICAP);  Surgeon: Stacia Glendia BRAVO, MD;  Location: [facility] ENDOSCOPY;  Service: Gastroenterology;;   HOT HEMOSTASIS N/A 09/23/2021   Procedure: HOT HEMOSTASIS (ARGON PLASMA COAGULATION/BICAP);  Surgeon: San Sandor GAILS, DO;  Location: [facility] ENDOSCOPY;  Service: Gastroenterology;  Laterality: N/A;   INSERTION OF DIALYSIS CATHETER     Rt chest   LAPAROSCOPIC RIGHT COLECTOMY N/A 08/05/2015   Procedure: LAPAROSCOPIC RIGHT COLECTOMY- ASCENDING;  Surgeon: Jina Nephew, MD;  Location: [facility] OR;  Service: General;  Laterality: N/A;   MASS EXCISION Left 05/28/2019   Procedure: EXCISION SOFT TISSUE MASS LEFT SHOULDER;  Surgeon: Eletha Boas, MD;  Location: [facility] ORS;  Service: General;  Laterality: Left;   NEPHRECTOMY Bilateral    PARATHYROIDECTOMY N/A 06/12/2016   Procedure: TOTAL PARATHYROIDECTOMY WITH AUTOTRANSPLANTATION TO LEFT FOREARM;  Surgeon: Boas Eletha, MD;  Location: [facility] OR;  Service: General;  Laterality: N/A;   PARATHYROIDECTOMY N/A 10/23/2016   Procedure: PARATHYROIDECTOMY;  Surgeon: Eletha Boas, MD;  Location: [facility] OR;  Service: General;  Laterality: N/A;   REVERSE SHOULDER ARTHROPLASTY Right 08/24/2020   Procedure: REVERSE SHOULDER ARTHROPLASTY;  Surgeon: Cristy Bonner DASEN, MD;  Location: [facility] OR;  Service: Orthopedics;  Laterality: Right;   REVISON OF ARTERIOVENOUS FISTULA Right 07/16/2017   Procedure: REVISION OF ARTERIOVENOUS FISTULA  Right ARM;  Surgeon: Sheree Penne Bruckner, MD;  Location[facility] OR;  Service: Vascular;  Laterality: Right;   TOTAL HIP ARTHROPLASTY Left 11/14/2020   Procedure: LEFT TOTAL HIP ARTHROPLASTY ANTERIOR APPROACH;   Surgeon: [name] Kay HERO, MD;  Location: [facility] OR;  Service: Orthopedics;  Laterality: Left;   UPPER GASTROINTESTINAL ENDOSCOPY  2021   @ [facility]    Family History  Problem Relation Age of Onset   Diabetes Father    Stroke Father    Hypertension Father    Uterine cancer Mother    Lupus Sister    Stroke Sister    Hypertension Sister    Anuerysm Brother        brain   Colon cancer Neg Hx    Esophageal cancer  Neg Hx    Stomach cancer Neg Hx    Pancreatic cancer Neg Hx    Liver disease Neg Hx    Colon polyps Neg Hx    Rectal cancer Neg Hx     Social History   Tobacco Use   Smoking status: Former    Current packs/day: 0.00    Types: Cigarettes    Quit date: 06/19/1990    Years since quitting: 33.3   Smokeless tobacco: Never   Tobacco comments:    rarely cigar  Vaping Use   Vaping status: Never Used  Substance Use Topics   Alcohol use: Not Currently    Alcohol/week: 1.0 standard drink of alcohol    Types: 1 Cans of beer per week    Comment: occasional beer   Drug use: Not Currently    Types: Marijuana    Comment: Last use several yrs ago    ROS   Objective:   Vitals: BP 110/67 (BP Location: Left Arm)   Pulse 92   Temp 98.2 F (36.8 C) (Oral)   Resp 18   SpO2 97%   Physical Exam Constitutional:      General: He is not in acute distress.    Appearance: Normal appearance. He is well-developed and normal weight. He is not ill-appearing, toxic-appearing or diaphoretic.  HENT:     Head: Normocephalic and atraumatic.     Right Ear: External ear normal.     Left Ear: External ear normal.     Nose: Nose normal.     Mouth/Throat:     Pharynx: Oropharynx is clear.  Eyes:     General: No scleral icterus.       Right eye: No discharge.        Left eye: No discharge.     Extraocular Movements: Extraocular movements intact.  Cardiovascular:     Rate and Rhythm: Normal rate.  Pulmonary:     Effort: Pulmonary effort is normal.  Musculoskeletal:     Cervical back:  Normal range of motion.     Right ankle: No swelling, deformity, ecchymosis or lacerations. Tenderness present over the lateral malleolus. No medial malleolus, ATF ligament, AITF ligament, CF ligament, posterior TF ligament, base of 5th metatarsal or proximal fibula tenderness. Normal range of motion.     Right Achilles Tendon: No tenderness or defects. Thompson's test negative.     Comments: Faint dorsalis pedis pulse, posterior tibialis pulse of the right lower extremity.  Trace swelling.  No warmth, erythema.  Neurological:     Mental Status: He is alert and oriented to person, place, and time.  Psychiatric:        Mood and Affect: Mood normal.        Behavior: Behavior normal.        Thought Content: Thought content normal.        Judgment: Judgment normal.     DG Ankle Complete Right Result Date: 10/27/2023 CLINICAL DATA:  63 year old male with right ankle pain onset 1 week ago. EXAM: RIGHT ANKLE - COMPLETE 3+ VIEW COMPARISON:  None Available. FINDINGS: Three views. Advanced calcified peripheral vascular disease. Heterogeneous bone mineralization, pes planus, and chronic neuropathic changes of the visible right foot difficult to exclude. Calcaneus appears to remain intact. Maintained mortise joint alignment. Talar dome intact. No obvious joint effusion. Asymmetric lateral soft tissue swelling at the ankle. No acute fracture or dislocation identified. No soft tissue gas. IMPRESSION: 1. Advanced calcified peripheral vascular disease. Lateral soft tissue swelling. No acute  fracture or dislocation identified about the right ankle. 2. Possible developing neuropathic changes in the right foot. Electronically Signed   By: VEAR Hurst M.D.   On: 10/27/2023 11:56    Assessment and Plan :   PDMP not reviewed this encounter.  1. Acute right ankle pain    Recommended conservative management including the use of Tylenol , compression socks, elevation.  Low suspicion for DVT.  Recommended close follow-up  with a vascular clinic as I suspect patient may have peripheral arterial disease, peripheral vascular disease as a primary source of his pain.  Counseled patient on potential for adverse effects with medications prescribed/recommended today, ER and return-to-clinic precautions discussed, patient verbalized understanding.    Christopher Savannah, PA-C 10/27/23 1325

## 2023-10-29 ENCOUNTER — Ambulatory Visit: Admitting: Physical Therapy

## 2023-10-29 DIAGNOSIS — R296 Repeated falls: Secondary | ICD-10-CM | POA: Diagnosis not present

## 2023-10-29 DIAGNOSIS — M6281 Muscle weakness (generalized): Secondary | ICD-10-CM | POA: Diagnosis not present

## 2023-10-29 DIAGNOSIS — M25611 Stiffness of right shoulder, not elsewhere classified: Secondary | ICD-10-CM | POA: Diagnosis not present

## 2023-10-29 DIAGNOSIS — M25511 Pain in right shoulder: Secondary | ICD-10-CM | POA: Diagnosis not present

## 2023-10-29 DIAGNOSIS — R2689 Other abnormalities of gait and mobility: Secondary | ICD-10-CM | POA: Diagnosis not present

## 2023-10-29 DIAGNOSIS — R262 Difficulty in walking, not elsewhere classified: Secondary | ICD-10-CM | POA: Diagnosis not present

## 2023-10-29 DIAGNOSIS — R2681 Unsteadiness on feet: Secondary | ICD-10-CM

## 2023-10-29 DIAGNOSIS — G8929 Other chronic pain: Secondary | ICD-10-CM | POA: Diagnosis not present

## 2023-10-29 DIAGNOSIS — M25561 Pain in right knee: Secondary | ICD-10-CM | POA: Diagnosis not present

## 2023-10-29 NOTE — Therapy (Signed)
 OUTPATIENT PHYSICAL THERAPY LOWER EXTREMITY TREATMENT   Patient Name: Carl Helbing Sr. MRN: [id number] DOB:07/28/1960, 63 y.o., male Today's Date: 10/29/2023  END OF SESSION:  PT End of Session - 10/29/23 0927     Number of Visits 25    Date for PT Re-Evaluation 12/05/23    Authorization Type [employer] Dual    Authorization Time Period 09/12/23 to 12/05/23    PT Start Time 0927    PT Stop Time 1010    PT Time Calculation (min) 43 min           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    Parathyroid  abnormality (HCC)    ectopic parathyroid  gland   Presence of arteriovenous fistula for hemodialysis, primary (HCC)    RUE PER PT RLE   Refusal of blood product    NO WHOLE BLOOD PROUCTS   Renal cell carcinoma (HCC)    s/p hand assisted laparoscopic bilateral nephrectomies 11/29/17, + RCC left   Secondary hyperparathyroidism (HCC)    Seizures (HCC)    one episode in past, due to elevated Potassium 08/02/20- at least 4 years ago   Sleep apnea    doesn't use CPAP anymore since weight loss   Stroke [facility])    no residual   Past Surgical History:  Procedure Laterality Date   ANTERIOR CERVICAL CORPECTOMY N/A 05/25/2023   Procedure: ANTERIOR CERVICAL CORPECTOMY CERVICAL FOUR, CERVICAL THREE - CERVICAL FIVE FUSION;  Surgeon: [name] Shove, MD;  Location: [facility] OR;  Service: Neurosurgery;  Laterality: N/A;   AV FISTULA PLACEMENT Right    right arm   BIOPSY   08/01/2019   Procedure: BIOPSY;  Surgeon: Kristie Lamprey, MD;  Location: [facility] ENDOSCOPY;  Service: Endoscopy;;   BIOPSY  11/18/2020   Procedure: BIOPSY;  Surgeon: Stacia Glendia BRAVO, MD;  Location: [facility] ENDOSCOPY;  Service: Gastroenterology;;   BIOPSY  09/13/2021   Procedure: BIOPSY;  Surgeon: Wilhelmenia Aloha Raddle., MD;  Location: [facility] ENDOSCOPY;  Service: Gastroenterology;;   CATARACT EXTRACTION W/ INTRAOCULAR LENS  IMPLANT, BILATERAL     COLON SURGERY     COLONOSCOPY N/A 08/04/2015   Procedure: COLONOSCOPY;  Surgeon: Gordy CHRISTELLA Starch, MD;  Location: [facility] ENDOSCOPY;  Service: Endoscopy;  Laterality: N/A;   COLONOSCOPY  2017   JMP@ [facility]-good prep-mass -recall 1 yr   COLONOSCOPY N/A 09/13/2021   Procedure: COLONOSCOPY;  Surgeon: Mansouraty, Aloha Raddle., MD;  Location: [facility] ENDOSCOPY;  Service: Gastroenterology;  Laterality: N/A;   COLONOSCOPY WITH PROPOFOL  N/A 11/25/2020   Procedure: COLONOSCOPY WITH PROPOFOL ;  Surgeon: Federico Rosario BROCKS, MD;  Location: [facility] ENDOSCOPY;  Service: Gastroenterology;  Laterality: N/A;   COLONOSCOPY WITH PROPOFOL  N/A 09/23/2021   Procedure: COLONOSCOPY WITH PROPOFOL ;  Surgeon: San Sandor GAILS, DO;  Location: [facility] ENDOSCOPY;  Service: Gastroenterology;  Laterality:  N/A;   ENTEROSCOPY N/A 09/23/2021   Procedure: ENTEROSCOPY;  Surgeon: San Sandor GAILS, DO;  Location: [facility] ENDOSCOPY;  Service: Gastroenterology;  Laterality: N/A;  Will place order for video capsule study as we may opt to place it during procedure   ESOPHAGOGASTRODUODENOSCOPY N/A 08/01/2019   Procedure: ESOPHAGOGASTRODUODENOSCOPY (EGD);  Surgeon: Kristie Lamprey, MD;  Location: [facility] ENDOSCOPY;  Service: Endoscopy;  Laterality: N/A;   ESOPHAGOGASTRODUODENOSCOPY N/A 11/18/2020   Procedure: ESOPHAGOGASTRODUODENOSCOPY (EGD);  Surgeon: Stacia Glendia BRAVO, MD;  Location: [facility] ENDOSCOPY;  Service: Gastroenterology;  Laterality: N/A;   ESOPHAGOGASTRODUODENOSCOPY (EGD) WITH PROPOFOL  N/A 08/04/2019   Procedure: ESOPHAGOGASTRODUODENOSCOPY  (EGD) WITH PROPOFOL ;  Surgeon: Leigh Elspeth SQUIBB, MD;  Location: [facility] ENDOSCOPY;  Service: Gastroenterology;  Laterality: N/A;   ESOPHAGOGASTRODUODENOSCOPY (EGD) WITH PROPOFOL  N/A 09/13/2021   Procedure: ESOPHAGOGASTRODUODENOSCOPY (EGD) WITH PROPOFOL ;  Surgeon: Wilhelmenia Aloha Raddle., MD;  Location: [facility] ENDOSCOPY;  Service: Gastroenterology;  Laterality: N/A;   GIVENS CAPSULE STUDY N/A 09/23/2021   Procedure: GIVENS CAPSULE STUDY;  Surgeon: San Sandor GAILS, DO;  Location: [facility] ENDOSCOPY;  Service: Gastroenterology;  Laterality: N/A;   graft left arm Left    for dialysis x 2. Removed   HOT HEMOSTASIS  11/18/2020   Procedure: HOT HEMOSTASIS (ARGON PLASMA COAGULATION/BICAP);  Surgeon: Stacia Glendia BRAVO, MD;  Location: [facility] ENDOSCOPY;  Service: Gastroenterology;;   HOT HEMOSTASIS N/A 09/23/2021   Procedure: HOT HEMOSTASIS (ARGON PLASMA COAGULATION/BICAP);  Surgeon: San Sandor GAILS, DO;  Location: [facility] ENDOSCOPY;  Service: Gastroenterology;  Laterality: N/A;   INSERTION OF DIALYSIS CATHETER     Rt chest   LAPAROSCOPIC RIGHT COLECTOMY N/A 08/05/2015   Procedure: LAPAROSCOPIC RIGHT COLECTOMY- ASCENDING;  Surgeon: Jina Nephew, MD;  Location: [facility] OR;  Service: General;  Laterality: N/A;   MASS EXCISION Left 05/28/2019   Procedure: EXCISION SOFT TISSUE MASS LEFT SHOULDER;  Surgeon: Eletha Boas, MD;  Location: [facility] ORS;  Service: General;  Laterality: Left;   NEPHRECTOMY Bilateral    PARATHYROIDECTOMY N/A 06/12/2016   Procedure: TOTAL PARATHYROIDECTOMY WITH AUTOTRANSPLANTATION TO LEFT FOREARM;  Surgeon: Boas Eletha, MD;  Location: [facility] OR;  Service: General;  Laterality: N/A;   PARATHYROIDECTOMY N/A 10/23/2016   Procedure: PARATHYROIDECTOMY;  Surgeon: Eletha Boas, MD;  Location: [facility] OR;  Service: General;  Laterality: N/A;   REVERSE SHOULDER ARTHROPLASTY Right 08/24/2020   Procedure: REVERSE SHOULDER ARTHROPLASTY;  Surgeon: Cristy Bonner DASEN, MD;  Location: [facility] OR;  Service: Orthopedics;  Laterality: Right;    REVISON OF ARTERIOVENOUS FISTULA Right 07/16/2017   Procedure: REVISION OF ARTERIOVENOUS FISTULA  Right ARM;  Surgeon: Sheree Penne Bruckner, MD;  Location: [facility] OR;  Service: Vascular;  Laterality: Right;   TOTAL HIP ARTHROPLASTY Left 11/14/2020   Procedure: LEFT TOTAL HIP ARTHROPLASTY ANTERIOR APPROACH;  Surgeon: [name] Kay HERO, MD;  Location: [facility] OR;  Service: Orthopedics;  Laterality: Left;   UPPER GASTROINTESTINAL ENDOSCOPY  2021   @ [facility]   Patient Active Problem List   Diagnosis Date Noted   Primary osteoarthritis of left knee 10/03/2023   Right hip pain 07/14/2023   Coping style affecting medical condition 06/05/2023   Spinal cord injury, cervical region, sequela (HCC) 05/30/2023   Cervical vertebral fracture (HCC) 05/25/2023   History of GI bleed 05/25/2023   C4 cervical fracture (HCC) 05/25/2023   Duodenitis    Gastritis and gastroduodenitis    Ischemic colitis (HCC)    Bright red blood per rectum    ABLA (acute blood loss anemia)    Gastric ulcer    Duodenal ulcer  Diverticulosis of colon without hemorrhage    GI bleed 09/17/2021   Acute upper GI bleeding 09/11/2021   Mucosal abnormality of esophagus    Mucosal abnormality of stomach    Mucosal abnormality of duodenum    Upper GI bleed    Left hip pain 11/16/2020   Type 2 diabetes mellitus with other specified complication (HCC) 11/14/2020   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/14/2020   Primary osteoarthritis of left hip 10/07/2020   Status post reverse total arthroplasty of right shoulder 08/24/2020   Diverticulitis 03/08/2020   ESRD (end stage renal disease) (HCC) 03/08/2020   Polyneuropathy in diseases classified elsewhere (HCC) 03/08/2020   Pure hypercholesterolemia 03/08/2020   Sacral back pain 03/08/2020   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 11/10/2019   Acute gastric ulcer with hemorrhage    Acute GI bleeding 07/31/2019   Hypotension of hemodialysis 07/31/2019   Diverticulitis large intestine  07/31/2019   Mass of soft tissue of shoulder 05/26/2019   Awaiting organ transplant status 03/18/2019   Finding of cocaine in blood 02/25/2019   Anaphylactic shock, unspecified, sequela 11/21/2018   Hypercalcemia 10/28/2018   Diarrhea, unspecified 08/08/2018   Hyperkalemia 03/12/2018   Renal cell carcinoma of left kidney (HCC) 12/24/2017   Fluid overload, unspecified 07/06/2017   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   Other specified disorders of kidney and ureter 11/22/2016   Secondary hyperparathyroidism of renal origin (HCC) 10/23/2016   Headache disorder 08/21/2016   TIA (transient ischemic attack)    Essential hypertension    Seizure (HCC)    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    Lung nodule    Diverticulitis of cecum 07/21/2015   Diverticulitis of large intestine without perforation or abscess without bleeding    Right lower quadrant pain    Obstructive sleep apnea 02/08/2015   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   Diverticulosis 07/13/2014   Lower GI bleed 07/12/2014   Refusal of blood transfusions as patient is Jehovah's Witness    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   HLD (hyperlipidemia)    PRES (posterior reversible encephalopathy syndrome)    History of CVA (cerebrovascular accident) 05/23/2014   Anemia of chronic disease 05/23/2014   GERD (gastroesophageal reflux disease) 12/09/2012   Gastrointestinal bleeding 11/12/2012   Melena 11/12/2012   Other psychoactive substance dependence, uncomplicated (HCC) 10/08/2012   Iron deficiency anemia, unspecified 06/10/2012   Moderate protein-calorie malnutrition (HCC) 07/29/2009   Coagulation defect, unspecified (HCC) 08/29/2006   Type 2 diabetes mellitus with diabetic peripheral angiopathy without gangrene (HCC) 08/29/2006   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 stage renal disease (HCC)  08/02/2006    PCP: [name] MD   REFERRING PROVIDER: Debby Fidela CROME, NP  REFERRING DIAG:  Diagnosis  S14.109S (ICD-10-CM) - Spinal cord injury, cervical region, sequela (HCC)    THERAPY DIAG:  Difficulty in walking, not elsewhere classified  Muscle weakness (generalized)  Unsteadiness on feet  Repeated falls  Rationale for Evaluation and Treatment: Rehabilitation  ONSET DATE:  ACDF 05/25/23  SUBJECTIVE:   SUBJECTIVE STATEMENT: Went to urgent care about RT ankle but checked out okay, referred to vascular surgery  PERTINENT HISTORY: See above   ACDF C3-C5 fusion, anterior cervical corpectomy C3-C4  PAIN:  Are you having pain? 5/10 R ankle  PRECAUTIONS:  Precautions:  Precautions Precautions: Fall, Cervical, Other (comment) Recall of Precautions/Restrictions: Impaired Precaution/Restrictions Comments: Verbally reviewed cervical precautions.  Required Braces or Orthoses: Cervical Brace Cervical Brace: Soft collar, At all times Restrictions Weight Bearing Restrictions Per Provider Order: No General: Pain: No c/o pain.   RED FLAGS: None   WEIGHT BEARING RESTRICTIONS: No  FALLS:  Has patient fallen in last 6 months? Yes. Number of falls 3- one that led to surgery, then 2 after getting home from the hospital  LIVING ENVIRONMENT: Lives with: lives with their son Lives in: House/apartment Stairs: ground floor apt  Has following equipment at home: Single point cane, Environmental consultant - 2 wheeled, and Environmental consultant - 4 wheeled  OCCUPATION: retired- used to pressure wash   PLOF: Independent, Independent with basic ADLs, Independent with gait, and Independent with transfers  PATIENT GOALS: get back to normal ability, drive convertible again   NEXT MD VISIT: Dr. Cornelio 11/15/23  OBJECTIVE:  Note: Objective measures were completed at Evaluation unless otherwise noted.  DIAGNOSTIC FINDINGS:    CLINICAL DATA:  63 year old male with altered mental status.   EXAM: CT  HEAD WITHOUT CONTRAST   TECHNIQUE: Contiguous axial images were obtained from the base of the skull through the vertex without intravenous contrast.   RADIATION DOSE REDUCTION: This exam was performed according to the departmental dose-optimization program which includes automated exposure control, adjustment of the mA and/or kV according to patient size and/or use of iterative reconstruction technique.   COMPARISON:  Brain MRI and head CT 05/25/2023.   FINDINGS: Brain:   Unusual extensive, diffuse pachymeningeal calcification which has been present since at least 2013. Stable cerebral volume. No midline shift, ventriculomegaly, mass effect, evidence of mass lesion, intracranial hemorrhage or evidence of cortically based acute infarction. Small area of chronic encephalomalacia in the medial left occipital lobe redemonstrated. Stable gray-white matter differentiation throughout the brain.   Vascular: Chronic severe pachymeningeal calcification.   Skull: Appears stable.  No acute osseous abnormality identified.   Sinuses/Orbits: Continued left middle ear and mastoid opacification. Other Visualized paranasal sinuses and mastoids are stable and well aerated.   Other: Anterior left scalp soft tissue injury has regressed since earlier in the month. Orbits soft tissues appears stable.   IMPRESSION: 1. No acute intracranial abnormality. 2. 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 3. Ongoing left middle ear and mastoid opacification. No acute osseous abnormality identified. 4. Regressed scalp soft tissue injury since 05/25/2023.  CLINICAL DATA:  Chronic bilateral knee pain and swelling.   EXAM: RIGHT KNEE - COMPLETE 4+ VIEW; LEFT KNEE - COMPLETE 4+ VIEW   COMPARISON:  Right knee radiographs dated 12/26/2021.   FINDINGS: Right knee:   No acute fracture or dislocation. There is a small to moderate-sized joint  effusion. Moderate tricompartmental degenerative changes with joint space narrowing and marginal osteophytosis. Vascular calcifications are noted.   Left knee:   No acute fracture or dislocation. There is a small joint effusion. Moderate tricompartmental degenerative changes with joint space narrowing and marginal osteophytosis. Vascular calcifications are noted.   IMPRESSION: 1. No acute osseous abnormality. 2. 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  Narrative & Impression  CLINICAL DATA:  Chronic bilateral knee pain and swelling.   EXAM: RIGHT KNEE - COMPLETE 4+ VIEW; LEFT KNEE - COMPLETE 4+ VIEW   COMPARISON:  Right knee radiographs dated 12/26/2021.   FINDINGS: Right knee:   No acute fracture or dislocation. There is a small to moderate-sized joint effusion. Moderate tricompartmental degenerative changes with joint space narrowing and marginal osteophytosis. Vascular  calcifications are noted.   Left knee:   No acute fracture or dislocation. There is a small joint effusion. Moderate tricompartmental degenerative changes with joint space narrowing and marginal osteophytosis. Vascular calcifications are noted.   IMPRESSION: 1. No acute osseous abnormality. 2. 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    Narrative & Impression CLINICAL DATA:  Right hip pain.   EXAM: DG HIP (WITH OR WITHOUT PELVIS) 2-3V RIGHT   COMPARISON:  None Available.   FINDINGS: No acute fracture or dislocation. The bones are osteopenic. Cystic changes of the right femoral head and neck measure up to 3.7 cm. Orthopedic referral is advised. Mild arthritic changes of the right hip. Total left hip arthroplasty. The soft tissues are unremarkable. Vascular calcifications.   IMPRESSION: 1. No acute fracture or dislocation. 2.  Cystic changes of the right femoral head and neck. Orthopedic referral is advised.  Lower Venous DVT Study   Patient Name:  Carl Hortman Sr.  Date of Exam:   06/10/2023  Medical Rec #: [id number]            Accession #:    [id number]  Date of Birth: [birth year]             Patient Gender: M  Patient Age:   2 years  Exam Location:  [facility]  Procedure:      VAS US  LOWER EXTREMITY VENOUS (DVT)  Referring Phys: PRENTICE COMPTON    - PATIENT SURVEYS:  PSFS: THE PATIENT SPECIFIC FUNCTIONAL SCALE  Place score of 0-10 (0 = unable to perform activity and 10 = able to perform activity at the same level as before injury or problem)  Activity Date: eval 09/12/23    Getting in and out of car (anything lower than 20 inches)  0    2. Balance  3    3. Walking without a device  2    4.  Social activities  0    Total Score 1.25      Total Score = Sum of activity scores/number of activities  Minimally Detectable Change: 3 points (for single activity); 2 points (for average score)  [employer] (nd). The Patient Specific Functional Scale . Retrieved from [email or web address]   COGNITION: Overall cognitive status: Within functional limits for tasks assessed     SENSATION: Hx of neuropathy even before fall/surgery     LOWER EXTREMITY MMT:  MMT Right eval Left eval Right 10/24/23 Left 10/24/23  Hip flexion 3- 3+ 3 3+  Hip extension      Hip abduction      Hip adduction      Hip internal rotation      Hip external rotation      Knee flexion 3- 4+ 4- 5  Knee extension 3- 5 3- 5  Ankle dorsiflexion 4 5 5 5   Ankle plantarflexion      Ankle inversion      Ankle eversion       (Blank rows = not tested)    GAIT: Distance walked: in clinic distances  Assistive device utilized: Environmental consultant - 2 wheeled Level of assistance: Modified independence Comments: slow but steady with RW  TREATMENT DATE:   10/29/23 Nustep L 5 BIL ankle red tband 4 way 10x 2 sets  Green tband HS curl 2 sets 10 LAQ 3# 2 sets 10 Standing with RW 3# 10 x SL hip flex,ext and abd,marching and HS curl Leg press 40# 2 sets 10. 20# SL 10 x BIL , assist to start on RT     10/24/23 NuStep L 5 x 6 min Goals  Stairs heavy UE use come SGA alt pattern 2 rails Sit to stand from elevated surface with compensation due to weakness LAQ 2lb 2x10  Eccentrics with RLE Gait 50 feet No AD CGA   Decrease heel strike and step length   10/22/23 Nustep L 5, 6 min Standing reaching outside base of support  Side steps at mat able  Leg press 40lb 2x10   SL 20lb x10 each assist need with RLE  Gait 62ft CGA cues for erect posture   Then 45ft LAQ 2lb 2x10  Eccentrics with RLE  10/16/23:  Nustep L 5, 6 min Lat rows(airex in seat) 20# 2 x 10 Rows standing 15# 2 x 10 Knee ext B 5# 2 x 10 Knee flexion 25# 2 x 10, 35# 1 x 10 Sit to stand from elevated mat 7 x Chest presses for, side, side 10 x each blue mediball   10/10/23 Nustep L 5 Green tband HS curls 2 sets 10 3# LAQ 2 sets 10 3# standing with RW 10 x BIL-marching,hip flex,ext ,abd and HS curl HHA 3# marching fwd and back 15 feet 2 x each HHA 3# side stepping 15 feet 2 x each Leg Press 20# BIL 10 x seat on 9, SL 10 x each 20 #  left leg seat on 11, RT needs assist to start mvmt. 20# BIL 10 x seat 8  10/08/23 Nustep L 5 Green tband HS curl 2 sets 10 2# LAQ 2 sets 10- quad lag BIL RT > left Standing with RW 2# marching,SL hip flex,ext and abd 20 x alt STS elevated mat 27.5  inches 2 sets 5 hands on knee, min A Amb with SPC CGA 100 feet with 2 slight sways but reqained Step up 4 in with RW 10 x BIL Step up 4 in with airex min A on RT 2 sets 5, left 10 x   10/01/23 UBE L 3 2 min fwd and back NuStep L 5 x 6 min STS elevated mat 27.5  inches 2  sets 5 hands on knee, CGA SLS HS curls 10 x 15# RT, left 10x 20#, BIL 25# 10 x Knee ext 5# LLE 2 sets 5 RT knee LAQ 3 sets 5     09/25/23 NuStep L 5 x 6 min LAQ RLE no weight, LLE 3lb cuff 2x10  Seated match 3lb 2x10 HS curls green 2x15 Sit to stand form elevated surface 2x5 with UE use Side steps at mat  Standing march in [facility]  09/12/23  Eval, POC      PATIENT EDUCATION:  Education details: exam findings, POC Person educated: Patient Education method: Explanation Education comprehension: verbalized understanding, returned demonstration, and needs further education  HOME EXERCISE PROGRAM: Educ on getting ankle wts, adjustable up to 5 is rec as we used 3 #- okayed doing seated and standing ex with RW  ASSESSMENT:  CLINICAL IMPRESSION: pt arrives after ER visit on Sunday, all good but sending to vascular. Added ankle tband ex today. Pt verb doing HEP. RT knee still weaker but increased ROM  with LAQ and increased stab with SLS ex .   Patient is a 63 y.o. M who was seen today for physical therapy evaluation and treatment for S14.109S (ICD-10-CM) - Spinal cord injury, cervical region, sequela (HCC) Pt tolerated a initial progression to therapy well. RLE is significantly weaker than R. Pt had log legs and a relatively short torso making it difficult to stand from low surfaces in conjunction with weak LE. Decrease coordination with side steps. No pain during session. Limited ROM with RLE during isolated exercises.  Will make every effort to improve function and work towards goals.   OBJECTIVE IMPAIRMENTS: Abnormal gait, decreased activity tolerance, decreased balance, decreased coordination, decreased knowledge of use of DME, decreased mobility, difficulty walking, decreased strength, postural dysfunction, and pain.   ACTIVITY LIMITATIONS: standing, squatting, stairs, transfers, bed mobility, toileting, dressing, and locomotion level  PARTICIPATION LIMITATIONS: shopping, community  activity, and yard work  PERSONAL FACTORS: Age, Behavior pattern, Education, Fitness, Past/current experiences, Profession, Social background, and Time since onset of injury/illness/exacerbation are also affecting patient's functional outcome.   REHAB POTENTIAL: Good  CLINICAL DECISION MAKING: Evolving/moderate complexity  EVALUATION COMPLEXITY: Moderate   GOALS: Goals reviewed with patient? No  SHORT TERM GOALS: Target date: 10/24/2023   Will be compliant with appropriate progressive HEP  Baseline: Goal status: 10/08/23 MET  2.  Will be able to consistently perform functional STS tranfers from surfaces 20 inches or less  Baseline:  Goal status:10/08/23 on going  3.  Will be able to ambulate at least 365ft with quad cane no more than light MinA  Baseline:  Goal status: 10/08/23 progressing  4.  MMT to be at least 3+/5 in all tested groups R LE  Baseline:  Goal status: ongoing 10/24/23    LONG TERM GOALS: Target date: 12/05/2023      R LE MMT to be at least 4-/5 in all tested groups  Baseline:  Goal status: ongoing 10/24/23  2.  Will score at least 18 on DGI with no device to show reduced fall risk  Baseline:  Goal status: INITIAL  3.  Will be able to reciprocally ascend/descend steps without difficulty and U rail  Baseline:  Goal status: Ongoing 10/24/23  4.  Will be able to ambulate community distances with LRAD, Mod(I) to allow for improved community access and participating in social activities  Baseline:  Goal status: INITIAL  5.  Will be compliant with appropriate gym based exercise program by time of DC  Baseline:  Goal status: reports going to gym some with his son , using bicycle.     PLAN:  PT FREQUENCY: 2x/week  PT DURATION: 12 weeks  PLANNED INTERVENTIONS: 97750- Physical Performance Testing, 97110-Therapeutic exercises, 97530- Therapeutic activity, [id number]- Neuromuscular re-education, 97535- Self Care, [id number]- Manual therapy, and [id number]- Gait  training  PLAN FOR NEXT SESSION: progress func strength, gait and balance. [name] PTA  10/29/23 9:28 AM

## 2023-10-30 DIAGNOSIS — D631 Anemia in chronic kidney disease: Secondary | ICD-10-CM | POA: Diagnosis not present

## 2023-10-30 DIAGNOSIS — D689 Coagulation defect, unspecified: Secondary | ICD-10-CM | POA: Diagnosis not present

## 2023-10-30 DIAGNOSIS — D509 Iron deficiency anemia, unspecified: Secondary | ICD-10-CM | POA: Diagnosis not present

## 2023-10-30 DIAGNOSIS — R197 Diarrhea, unspecified: Secondary | ICD-10-CM | POA: Diagnosis not present

## 2023-10-30 DIAGNOSIS — N2581 Secondary hyperparathyroidism of renal origin: Secondary | ICD-10-CM | POA: Diagnosis not present

## 2023-10-30 DIAGNOSIS — Z992 Dependence on renal dialysis: Secondary | ICD-10-CM | POA: Diagnosis not present

## 2023-10-31 ENCOUNTER — Ambulatory Visit: Admitting: Physical Therapy

## 2023-10-31 DIAGNOSIS — R296 Repeated falls: Secondary | ICD-10-CM | POA: Diagnosis not present

## 2023-10-31 DIAGNOSIS — G8929 Other chronic pain: Secondary | ICD-10-CM | POA: Diagnosis not present

## 2023-10-31 DIAGNOSIS — R2689 Other abnormalities of gait and mobility: Secondary | ICD-10-CM | POA: Diagnosis not present

## 2023-10-31 DIAGNOSIS — R2681 Unsteadiness on feet: Secondary | ICD-10-CM

## 2023-10-31 DIAGNOSIS — M6281 Muscle weakness (generalized): Secondary | ICD-10-CM | POA: Diagnosis not present

## 2023-10-31 DIAGNOSIS — R262 Difficulty in walking, not elsewhere classified: Secondary | ICD-10-CM | POA: Diagnosis not present

## 2023-10-31 DIAGNOSIS — M25611 Stiffness of right shoulder, not elsewhere classified: Secondary | ICD-10-CM | POA: Diagnosis not present

## 2023-10-31 DIAGNOSIS — M25561 Pain in right knee: Secondary | ICD-10-CM | POA: Diagnosis not present

## 2023-10-31 DIAGNOSIS — M25511 Pain in right shoulder: Secondary | ICD-10-CM | POA: Diagnosis not present

## 2023-10-31 NOTE — Therapy (Signed)
 OUTPATIENT PHYSICAL THERAPY LOWER EXTREMITY TREATMENT   Patient Name: Carl Pless Sr. MRN: [id number] DOB:Nov 09, 1960, 63 y.o., male Today's Date: 10/31/2023  END OF SESSION:  PT End of Session - 10/31/23 0802     Visit Number 9    Number of Visits 25    Date for PT Re-Evaluation 12/05/23    Authorization Type [employer] Dual    Authorization Time Period 09/12/23 to 12/05/23    PT Start Time 0758    PT Stop Time 0845    PT Time Calculation (min) 47 min           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    Parathyroid  abnormality (HCC)    ectopic parathyroid  gland   Presence of arteriovenous fistula for hemodialysis, primary (HCC)    RUE PER PT RLE   Refusal of blood product    NO WHOLE BLOOD PROUCTS   Renal cell carcinoma (HCC)    s/p hand assisted laparoscopic bilateral nephrectomies 11/29/17, + RCC left   Secondary hyperparathyroidism (HCC)    Seizures (HCC)    one episode in past, due to elevated Potassium 08/02/20- at least 4 years ago   Sleep apnea    doesn't use CPAP anymore since weight loss   Stroke [facility])    no residual   Past Surgical History:  Procedure Laterality Date   ANTERIOR CERVICAL CORPECTOMY N/A 05/25/2023   Procedure: ANTERIOR CERVICAL CORPECTOMY CERVICAL FOUR, CERVICAL THREE - CERVICAL FIVE FUSION;  Surgeon: [name] Shove, MD;  Location: [facility] OR;  Service: Neurosurgery;  Laterality: N/A;   AV FISTULA PLACEMENT Right    right  arm   BIOPSY  08/01/2019   Procedure: BIOPSY;  Surgeon: Kristie Lamprey, MD;  Location: [facility] ENDOSCOPY;  Service: Endoscopy;;   BIOPSY  11/18/2020   Procedure: BIOPSY;  Surgeon: Stacia Glendia BRAVO, MD;  Location: [facility] ENDOSCOPY;  Service: Gastroenterology;;   BIOPSY  09/13/2021   Procedure: BIOPSY;  Surgeon: Wilhelmenia Aloha Raddle., MD;  Location: [facility] ENDOSCOPY;  Service: Gastroenterology;;   CATARACT EXTRACTION W/ INTRAOCULAR LENS  IMPLANT, BILATERAL     COLON SURGERY     COLONOSCOPY N/A 08/04/2015   Procedure: COLONOSCOPY;  Surgeon: Gordy CHRISTELLA Starch, MD;  Location: [facility] ENDOSCOPY;  Service: Endoscopy;  Laterality: N/A;   COLONOSCOPY  2017   JMP@ [facility]-good prep-mass -recall 1 yr   COLONOSCOPY N/A 09/13/2021   Procedure: COLONOSCOPY;  Surgeon: Mansouraty, Aloha Raddle., MD;  Location: [facility] ENDOSCOPY;  Service: Gastroenterology;  Laterality: N/A;   COLONOSCOPY WITH PROPOFOL  N/A 11/25/2020   Procedure: COLONOSCOPY WITH PROPOFOL ;  Surgeon: Federico Rosario BROCKS, MD;  Location: [facility] ENDOSCOPY;  Service: Gastroenterology;  Laterality: N/A;   COLONOSCOPY WITH PROPOFOL  N/A 09/23/2021   Procedure: COLONOSCOPY WITH PROPOFOL ;  Surgeon: San Sandor GAILS, DO;  Location: [facility]  ENDOSCOPY;  Service: Gastroenterology;  Laterality: N/A;   ENTEROSCOPY N/A 09/23/2021   Procedure: ENTEROSCOPY;  Surgeon: San Sandor GAILS, DO;  Location: [facility] ENDOSCOPY;  Service: Gastroenterology;  Laterality: N/A;  Will place order for video capsule study as we may opt to place it during procedure   ESOPHAGOGASTRODUODENOSCOPY N/A 08/01/2019   Procedure: ESOPHAGOGASTRODUODENOSCOPY (EGD);  Surgeon: Kristie Lamprey, MD;  Location: [facility] ENDOSCOPY;  Service: Endoscopy;  Laterality: N/A;   ESOPHAGOGASTRODUODENOSCOPY N/A 11/18/2020   Procedure: ESOPHAGOGASTRODUODENOSCOPY (EGD);  Surgeon: Stacia Glendia BRAVO, MD;  Location: [facility] ENDOSCOPY;  Service: Gastroenterology;  Laterality: N/A;   ESOPHAGOGASTRODUODENOSCOPY (EGD) WITH PROPOFOL  N/A 08/04/2019   Procedure:  ESOPHAGOGASTRODUODENOSCOPY (EGD) WITH PROPOFOL ;  Surgeon: Leigh Elspeth SQUIBB, MD;  Location: [facility] ENDOSCOPY;  Service: Gastroenterology;  Laterality: N/A;   ESOPHAGOGASTRODUODENOSCOPY (EGD) WITH PROPOFOL  N/A 09/13/2021   Procedure: ESOPHAGOGASTRODUODENOSCOPY (EGD) WITH PROPOFOL ;  Surgeon: Wilhelmenia Aloha Raddle., MD;  Location: [facility] ENDOSCOPY;  Service: Gastroenterology;  Laterality: N/A;   GIVENS CAPSULE STUDY N/A 09/23/2021   Procedure: GIVENS CAPSULE STUDY;  Surgeon: San Sandor GAILS, DO;  Location: [facility] ENDOSCOPY;  Service: Gastroenterology;  Laterality: N/A;   graft left arm Left    for dialysis x 2. Removed   HOT HEMOSTASIS  11/18/2020   Procedure: HOT HEMOSTASIS (ARGON PLASMA COAGULATION/BICAP);  Surgeon: Stacia Glendia BRAVO, MD;  Location: [facility] ENDOSCOPY;  Service: Gastroenterology;;   HOT HEMOSTASIS N/A 09/23/2021   Procedure: HOT HEMOSTASIS (ARGON PLASMA COAGULATION/BICAP);  Surgeon: San Sandor GAILS, DO;  Location: [facility] ENDOSCOPY;  Service: Gastroenterology;  Laterality: N/A;   INSERTION OF DIALYSIS CATHETER     Rt chest   LAPAROSCOPIC RIGHT COLECTOMY N/A 08/05/2015   Procedure: LAPAROSCOPIC RIGHT COLECTOMY- ASCENDING;  Surgeon: Jina Nephew, MD;  Location: [facility] OR;  Service: General;  Laterality: N/A;   MASS EXCISION Left 05/28/2019   Procedure: EXCISION SOFT TISSUE MASS LEFT SHOULDER;  Surgeon: Eletha Boas, MD;  Location: [facility] ORS;  Service: General;  Laterality: Left;   NEPHRECTOMY Bilateral    PARATHYROIDECTOMY N/A 06/12/2016   Procedure: TOTAL PARATHYROIDECTOMY WITH AUTOTRANSPLANTATION TO LEFT FOREARM;  Surgeon: Boas Eletha, MD;  Location: [facility] OR;  Service: General;  Laterality: N/A;   PARATHYROIDECTOMY N/A 10/23/2016   Procedure: PARATHYROIDECTOMY;  Surgeon: Eletha Boas, MD;  Location: [facility] OR;  Service: General;  Laterality: N/A;   REVERSE SHOULDER ARTHROPLASTY Right 08/24/2020   Procedure: REVERSE SHOULDER ARTHROPLASTY;  Surgeon: Cristy Bonner DASEN, MD;  Location: [facility] OR;  Service: Orthopedics;   Laterality: Right;   REVISON OF ARTERIOVENOUS FISTULA Right 07/16/2017   Procedure: REVISION OF ARTERIOVENOUS FISTULA  Right ARM;  Surgeon: Sheree Penne Bruckner, MD;  Location: [facility] OR;  Service: Vascular;  Laterality: Right;   TOTAL HIP ARTHROPLASTY Left 11/14/2020   Procedure: LEFT TOTAL HIP ARTHROPLASTY ANTERIOR APPROACH;  Surgeon: [name] Kay HERO, MD;  Location: [facility] OR;  Service: Orthopedics;  Laterality: Left;   UPPER GASTROINTESTINAL ENDOSCOPY  2021   @ [facility]   Patient Active Problem List   Diagnosis Date Noted   Primary osteoarthritis of left knee 10/03/2023   Right hip pain 07/14/2023   Coping style affecting medical condition 06/05/2023   Spinal cord injury, cervical region, sequela (HCC) 05/30/2023   Cervical vertebral fracture (HCC) 05/25/2023   History of GI bleed 05/25/2023   C4 cervical fracture (HCC) 05/25/2023   Duodenitis    Gastritis and gastroduodenitis    Ischemic colitis (HCC)    Bright red blood per rectum    ABLA (acute blood loss anemia)    Gastric ulcer  Duodenal ulcer    Diverticulosis of colon without hemorrhage    GI bleed 09/17/2021   Acute upper GI bleeding 09/11/2021   Mucosal abnormality of esophagus    Mucosal abnormality of stomach    Mucosal abnormality of duodenum    Upper GI bleed    Left hip pain 11/16/2020   Type 2 diabetes mellitus with other specified complication (HCC) 11/14/2020   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/14/2020   Primary osteoarthritis of left hip 10/07/2020   Status post reverse total arthroplasty of right shoulder 08/24/2020   Diverticulitis 03/08/2020   ESRD (end stage renal disease) (HCC) 03/08/2020   Polyneuropathy in diseases classified elsewhere (HCC) 03/08/2020   Pure hypercholesterolemia 03/08/2020   Sacral back pain 03/08/2020   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 11/10/2019   Acute gastric ulcer with hemorrhage    Acute GI bleeding 07/31/2019   Hypotension of hemodialysis 07/31/2019    Diverticulitis large intestine 07/31/2019   Mass of soft tissue of shoulder 05/26/2019   Awaiting organ transplant status 03/18/2019   Finding of cocaine in blood 02/25/2019   Anaphylactic shock, unspecified, sequela 11/21/2018   Hypercalcemia 10/28/2018   Diarrhea, unspecified 08/08/2018   Hyperkalemia 03/12/2018   Renal cell carcinoma of left kidney (HCC) 12/24/2017   Fluid overload, unspecified 07/06/2017   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   Other specified disorders of kidney and ureter 11/22/2016   Secondary hyperparathyroidism of renal origin (HCC) 10/23/2016   Headache disorder 08/21/2016   TIA (transient ischemic attack)    Essential hypertension    Seizure (HCC)    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    Lung nodule    Diverticulitis of cecum 07/21/2015   Diverticulitis of large intestine without perforation or abscess without bleeding    Right lower quadrant pain    Obstructive sleep apnea 02/08/2015   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   Diverticulosis 07/13/2014   Lower GI bleed 07/12/2014   Refusal of blood transfusions as patient is Jehovah's Witness    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   HLD (hyperlipidemia)    PRES (posterior reversible encephalopathy syndrome)    History of CVA (cerebrovascular accident) 05/23/2014   Anemia of chronic disease 05/23/2014   GERD (gastroesophageal reflux disease) 12/09/2012   Gastrointestinal bleeding 11/12/2012   Melena 11/12/2012   Other psychoactive substance dependence, uncomplicated (HCC) 10/08/2012   Iron deficiency anemia, unspecified 06/10/2012   Moderate protein-calorie malnutrition (HCC) 07/29/2009   Coagulation defect, unspecified (HCC) 08/29/2006   Type 2 diabetes mellitus with diabetic peripheral angiopathy without gangrene (HCC) 08/29/2006   Hypertensive chronic kidney disease with stage 5 chronic kidney disease or end  stage renal disease (HCC) 08/02/2006    PCP: [name] MD   REFERRING PROVIDER: Debby Fidela CROME, NP  REFERRING DIAG:  Diagnosis  S14.109S (ICD-10-CM) - Spinal cord injury, cervical region, sequela (HCC)    THERAPY DIAG:  Difficulty in walking, not elsewhere classified  Muscle weakness (generalized)  Unsteadiness on feet  Repeated falls  Rationale for Evaluation and Treatment: Rehabilitation  ONSET DATE:  ACDF 05/25/23  SUBJECTIVE:   SUBJECTIVE STATEMENT: ankle still not right PERTINENT HISTORY: See above   ACDF C3-C5 fusion, anterior cervical corpectomy C3-C4  PAIN:  Are you having pain? 6-8/10 R ankle  PRECAUTIONS:  Precautions:  Precautions Precautions: Fall, Cervical, Other (comment) Recall of Precautions/Restrictions: Impaired Precaution/Restrictions Comments: Verbally reviewed cervical precautions. Required Braces or Orthoses: Cervical Brace Cervical  Brace: Soft collar, At all times Restrictions Weight Bearing Restrictions Per Provider Order: No General: Pain: No c/o pain.   RED FLAGS: None   WEIGHT BEARING RESTRICTIONS: No  FALLS:  Has patient fallen in last 6 months? Yes. Number of falls 3- one that led to surgery, then 2 after getting home from the hospital  LIVING ENVIRONMENT: Lives with: lives with their son Lives in: House/apartment Stairs: ground floor apt  Has following equipment at home: Single point cane, Environmental consultant - 2 wheeled, and Environmental consultant - 4 wheeled  OCCUPATION: retired- used to pressure wash   PLOF: Independent, Independent with basic ADLs, Independent with gait, and Independent with transfers  PATIENT GOALS: get back to normal ability, drive convertible again   NEXT MD VISIT: Dr. Cornelio 11/15/23  OBJECTIVE:  Note: Objective measures were completed at Evaluation unless otherwise noted.  DIAGNOSTIC FINDINGS:    CLINICAL DATA:  63 year old male with altered mental status.   EXAM: CT HEAD WITHOUT CONTRAST    TECHNIQUE: Contiguous axial images were obtained from the base of the skull through the vertex without intravenous contrast.   RADIATION DOSE REDUCTION: This exam was performed according to the departmental dose-optimization program which includes automated exposure control, adjustment of the mA and/or kV according to patient size and/or use of iterative reconstruction technique.   COMPARISON:  Brain MRI and head CT 05/25/2023.   FINDINGS: Brain:   Unusual extensive, diffuse pachymeningeal calcification which has been present since at least 2013. Stable cerebral volume. No midline shift, ventriculomegaly, mass effect, evidence of mass lesion, intracranial hemorrhage or evidence of cortically based acute infarction. Small area of chronic encephalomalacia in the medial left occipital lobe redemonstrated. Stable gray-white matter differentiation throughout the brain.   Vascular: Chronic severe pachymeningeal calcification.   Skull: Appears stable.  No acute osseous abnormality identified.   Sinuses/Orbits: Continued left middle ear and mastoid opacification. Other Visualized paranasal sinuses and mastoids are stable and well aerated.   Other: Anterior left scalp soft tissue injury has regressed since earlier in the month. Orbits soft tissues appears stable.   IMPRESSION: 1. No acute intracranial abnormality. 2. 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 3. Ongoing left middle ear and mastoid opacification. No acute osseous abnormality identified. 4. Regressed scalp soft tissue injury since 05/25/2023.  CLINICAL DATA:  Chronic bilateral knee pain and swelling.   EXAM: RIGHT KNEE - COMPLETE 4+ VIEW; LEFT KNEE - COMPLETE 4+ VIEW   COMPARISON:  Right knee radiographs dated 12/26/2021.   FINDINGS: Right knee:   No acute fracture or dislocation. There is a small to moderate-sized joint effusion. Moderate  tricompartmental degenerative changes with joint space narrowing and marginal osteophytosis. Vascular calcifications are noted.   Left knee:   No acute fracture or dislocation. There is a small joint effusion. Moderate tricompartmental degenerative changes with joint space narrowing and marginal osteophytosis. Vascular calcifications are noted.   IMPRESSION: 1. No acute osseous abnormality. 2. 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  Narrative & Impression  CLINICAL DATA:  Chronic bilateral knee pain and swelling.   EXAM: RIGHT KNEE - COMPLETE 4+ VIEW; LEFT KNEE - COMPLETE 4+ VIEW   COMPARISON:  Right knee radiographs dated 12/26/2021.   FINDINGS: Right knee:   No acute fracture or dislocation. There is a small to moderate-sized joint effusion. Moderate tricompartmental degenerative changes with joint space narrowing and marginal osteophytosis. Vascular calcifications are noted.   Left knee:  No acute fracture or dislocation. There is a small joint effusion. Moderate tricompartmental degenerative changes with joint space narrowing and marginal osteophytosis. Vascular calcifications are noted.   IMPRESSION: 1. No acute osseous abnormality. 2. 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    Narrative & Impression CLINICAL DATA:  Right hip pain.   EXAM: DG HIP (WITH OR WITHOUT PELVIS) 2-3V RIGHT   COMPARISON:  None Available.   FINDINGS: No acute fracture or dislocation. The bones are osteopenic. Cystic changes of the right femoral head and neck measure up to 3.7 cm. Orthopedic referral is advised. Mild arthritic changes of the right hip. Total left hip arthroplasty. The soft tissues are unremarkable. Vascular calcifications.   IMPRESSION: 1. No acute fracture or dislocation. 2. Cystic changes of the  right femoral head and neck. Orthopedic referral is advised.  Lower Venous DVT Study   Patient Name:  Carl Merriweather Sr.  Date of Exam:   06/10/2023  Medical Rec #: [id number]            Accession #:    [id number]  Date of Birth: 1961/02/10             Patient Gender: M  Patient Age:   78 years  Exam Location:  [facility]  Procedure:      VAS US  LOWER EXTREMITY VENOUS (DVT)  Referring Phys: PRENTICE COMPTON    - PATIENT SURVEYS:  PSFS: THE PATIENT SPECIFIC FUNCTIONAL SCALE  Place score of 0-10 (0 = unable to perform activity and 10 = able to perform activity at the same level as before injury or problem)  Activity Date: eval 09/12/23    Getting in and out of car (anything lower than 20 inches)  0    2. Balance  3    3. Walking without a device  2    4.  Social activities  0    Total Score 1.25      Total Score = Sum of activity scores/number of activities  Minimally Detectable Change: 3 points (for single activity); 2 points (for average score)  [employer] [facility] (nd). The Patient Specific Functional Scale . Retrieved from [email or web address]   COGNITION: Overall cognitive status: Within functional limits for tasks assessed     SENSATION: Hx of neuropathy even before fall/surgery     LOWER EXTREMITY MMT:  MMT Right eval Left eval Right 10/24/23 Left 10/24/23 RT/Left 10/31/23  Hip flexion 3- 3+ 3 3+ 3/3+  Hip extension       Hip abduction       Hip adduction       Hip internal rotation       Hip external rotation       Knee flexion 3- 4+ 4- 5 4/5  Knee extension 3- 5 3- 5 3-/5  Ankle dorsiflexion 4 5 5 5    Ankle plantarflexion       Ankle inversion       Ankle eversion        (Blank rows = not tested)    GAIT: Distance walked: in clinic distances  Assistive device utilized: Environmental consultant - 2 wheeled Level of assistance: Modified independence Comments: slow but steady with RW  TREATMENT DATE:   10/31/23 Nustep L 5 STGs assessed including MMT Leg Press 40# 2 sets 10, SL 10 x BIL 20#, AA with RT to start 6 inch step up with UE support 10 x BIL CG- min A 4 inch step down with UE 10 xBIL CG-min A STS no hands CG-min A with cuing 28 in , could not do lower 10 x  10/29/23 Nustep L 5 BIL ankle red tband 4 way 10x 2 sets  Green tband HS curl 2 sets 10 LAQ 3# 2 sets 10 Standing with RW 3# 10 x SL hip flex,ext and abd,marching and HS curl Leg press 40# 2 sets 10. 20# SL 10 x BIL , assist to start on RT Green tband HS curl 2 sets 10     10/24/23 NuStep L 5 x 6 min Goals  Stairs heavy UE use come SGA alt pattern 2 rails Sit to stand from elevated surface with compensation due to weakness LAQ 2lb 2x10  Eccentrics with RLE Gait 50 feet No AD CGA   Decrease heel strike and step length   10/22/23 Nustep L 5, 6 min Standing reaching outside base of support  Side steps at mat able  Leg press 40lb 2x10   SL 20lb x10 each assist need with RLE  Gait 79ft CGA cues for erect posture   Then 89ft LAQ 2lb 2x10  Eccentrics with RLE  10/16/23:  Nustep L 5, 6 min Lat rows(airex in seat) 20# 2 x 10 Rows standing 15# 2 x 10 Knee ext B 5# 2 x 10 Knee flexion 25# 2 x 10, 35# 1 x 10 Sit to stand from elevated mat 7 x Chest presses for, side, side 10 x each blue mediball   10/10/23 Nustep L 5 Green tband HS curls 2 sets 10 3# LAQ 2 sets 10 3# standing with RW 10 x BIL-marching,hip flex,ext ,abd and HS curl HHA 3# marching fwd and back 15 feet 2 x each HHA 3# side stepping 15 feet 2 x each Leg Press 20# BIL 10 x seat on 9, SL 10 x each 20 #  left leg seat on 11, RT needs assist to start mvmt. 20# BIL 10 x seat 8  10/08/23 Nustep L 5 Green tband HS curl 2 sets 10 2# LAQ 2 sets 10- quad lag BIL RT > left Standing with RW 2# marching,SL hip  flex,ext and abd 20 x alt STS elevated mat 27.5  inches 2 sets 5 hands on knee, min A Amb with SPC CGA 100 feet with 2 slight sways but reqained Step up 4 in with RW 10 x BIL Step up 4 in with airex min A on RT 2 sets 5, left 10 x   10/01/23 UBE L 3 2 min fwd and back NuStep L 5 x 6 min STS elevated mat 27.5  inches 2 sets 5 hands on knee, CGA SLS HS curls 10 x 15# RT, left 10x 20#, BIL 25# 10 x Knee ext 5# LLE 2 sets 5 RT knee LAQ 3 sets 5     09/25/23 NuStep L 5 x 6 min LAQ RLE no weight, LLE 3lb cuff 2x10  Seated match 3lb 2x10 HS curls green 2x15 Sit to stand form elevated surface 2x5 with UE use Side steps at mat  Standing march in [facility]  09/12/23  Eval, POC      PATIENT EDUCATION:  Education details: exam findings, POC Person educated: Patient Education method: Explanation  Education comprehension: verbalized understanding, returned demonstration, and needs further education  HOME EXERCISE PROGRAM: Educ on getting ankle wts, adjustable up to 5 is rec as we used 3 #- okayed doing seated and standing ex with RW  ASSESSMENT:  CLINICAL IMPRESSION: STGs assessed. Added step ups and step down with assistance and cuing as pt struggles with initial unlocking on knees for eccentric lowering. Same issue with sitting as he struggles with bending knees and eccentric lowering  Patient is a 63 y.o. M who was seen today for physical therapy evaluation and treatment for S14.109S (ICD-10-CM) - Spinal cord injury, cervical region, sequela (HCC) Pt tolerated a initial progression to therapy well. RLE is significantly weaker than R. Pt had log legs and a relatively short torso making it difficult to stand from low surfaces in conjunction with weak LE. Decrease coordination with side steps. No pain during session. Limited ROM with RLE during isolated exercises.  Will make every effort to improve function and work towards goals.   OBJECTIVE IMPAIRMENTS: Abnormal gait, decreased activity  tolerance, decreased balance, decreased coordination, decreased knowledge of use of DME, decreased mobility, difficulty walking, decreased strength, postural dysfunction, and pain.   ACTIVITY LIMITATIONS: standing, squatting, stairs, transfers, bed mobility, toileting, dressing, and locomotion level  PARTICIPATION LIMITATIONS: shopping, community activity, and yard work  PERSONAL FACTORS: Age, Behavior pattern, Education, Fitness, Past/current experiences, Profession, Social background, and Time since onset of injury/illness/exacerbation are also affecting patient's functional outcome.   REHAB POTENTIAL: Good  CLINICAL DECISION MAKING: Evolving/moderate complexity  EVALUATION COMPLEXITY: Moderate   GOALS: Goals reviewed with patient? No  SHORT TERM GOALS: Target date: 10/24/2023   Will be compliant with appropriate progressive HEP  Baseline: Goal status: 10/08/23 MET  2.  Will be able to consistently perform functional STS tranfers from surfaces 20 inches or less  Baseline:  Goal status:10/08/23 on going  10/31/23 progressing still difficult for pt  3.  Will be able to ambulate at least 316ft with quad cane no more than light MinA  Baseline:  Goal status: 10/08/23 progressing  and 10/31/23  4.  MMT to be at least 3+/5 in all tested groups R LE  Baseline:  Goal status: ongoing 10/24/23  and 10/31/23    LONG TERM GOALS: Target date: 12/05/2023      R LE MMT to be at least 4-/5 in all tested groups  Baseline:  Goal status: ongoing 10/24/23  2.  Will score at least 18 on DGI with no device to show reduced fall risk  Baseline:  Goal status: INITIAL  3.  Will be able to reciprocally ascend/descend steps without difficulty and U rail  Baseline:  Goal status: Ongoing 10/24/23  4.  Will be able to ambulate community distances with LRAD, Mod(I) to allow for improved community access and participating in social activities  Baseline:  Goal status: INITIAL  5.  Will be compliant  with appropriate gym based exercise program by time of DC  Baseline:  Goal status: reports going to gym some with his son , using bicycle.     PLAN:  PT FREQUENCY: 2x/week  PT DURATION: 12 weeks  PLANNED INTERVENTIONS: 97750- Physical Performance Testing, 97110-Therapeutic exercises, 97530- Therapeutic activity, [id number]- Neuromuscular re-education, 97535- Self Care, [id number]- Manual therapy, and [id number]- Gait training  PLAN FOR NEXT SESSION: progress func strength, gait and balance. Jon Dominque Levandowski PTA  10/31/23 8:03 AM

## 2023-11-01 ENCOUNTER — Ambulatory Visit (INDEPENDENT_AMBULATORY_CARE_PROVIDER_SITE_OTHER): Admitting: Physician Assistant

## 2023-11-01 DIAGNOSIS — S9001XA Contusion of right ankle, initial encounter: Secondary | ICD-10-CM | POA: Diagnosis not present

## 2023-11-01 NOTE — Progress Notes (Signed)
 Office Visit Note   Patient: Carl Ellenbecker Sr.           Date of Birth: 1960/11/08           MRN: [id number] Visit Date: 11/01/2023              Requested by: [name], MD [address] [place],  [place] [address] PCP: [name], MD  Chief Complaint  Patient presents with   Right Ankle - Pain      HPI: 63 y/o male who was seen in Urgent care for a   1 week history of persistent right ankle pain over the lateral aspect.  Has had some intermittent swelling.  No fall, trauma. He ambulates with a rolling walker.   Past medical history includes: ESRD, PAD with calcified vessels.  He has a referral to see Vascular for PAD work up.  On 05/25/23 he suffered a ground level fall and sustained a C4 burst fracture.  S/P C4 Corpectomy followed by anterior fusion C3-C5.  He has weakness on the right side post injury.    He was sent home with a treatment plan for conservative treatment to include: Tylenol , compression socks, elevation.   Assessment & Plan: Visit Diagnoses:  1. Contusion of right ankle, initial encounter     Plan: conservative treatment to include: Tylenol , compression socks, elevation.   Follow-Up Instructions: Return in about 25 days (around 11/26/2023).   Ortho Exam  Patient is alert, oriented, no adenopathy, well-dressed, normal affect, normal respiratory effort. Lateral right ankle contusion with slight edema and erythema/ecchymosis.  Tenderness to palpation above the distal lateral malleolus.  NTTP over the peroneal tendon.  Intact ankle ROM.  NTTP forefoot or toes.  No ischemic skin changes.  Doppler signals intact B LE DP/PT.      Imaging: Ankle x ray 10/27/23 IMPRESSION: 1. Advanced calcified peripheral vascular disease. Lateral soft tissue swelling. No acute fracture or dislocation identified about the right ankle. 2. Possible developing neuropathic changes in the right foot.    Labs: Lab Results  Component Value Date   HGBA1C  4.8 11/15/2020   HGBA1C 5.3 05/28/2019   HGBA1C 4.8 10/02/2015   ESRSEDRATE 53 (H) 03/12/2023   CRP 5.2 03/12/2023   LABURIC 4.0 11/15/2020   REPTSTATUS 06/22/2023 FINAL 06/18/2023   GRAMSTAIN  06/18/2023    ABUNDANT WBC PRESENT, PREDOMINANTLY PMN NO ORGANISMS SEEN    CULT  06/18/2023    NO GROWTH 3 DAYS Performed at [facility], [address], [place], [name]      Lab Results  Component Value Date   ALBUMIN  2.7 (L) 07/15/2023   ALBUMIN  2.8 (L) 07/12/2023   ALBUMIN  2.8 (L) 07/10/2023    Lab Results  Component Value Date   MG 2.1 08/04/2015   MG 1.7 08/02/2015   No results found for: VD25OH  No results found for: PREALBUMIN    Latest Ref Rng & Units 07/15/2023    9:15 AM 07/12/2023   11:00 AM 07/10/2023    1:51 PM  CBC EXTENDED  WBC 4.0 - 10.5 K/uL 5.2  5.9  5.7   RBC 4.22 - 5.81 MIL/uL 3.34  3.52  3.33   Hemoglobin 13.0 - 17.0 g/dL 9.4  89.9  9.3   HCT 60.9 - 52.0 % 29.5  31.0  29.7   Platelets 150 - 400 K/uL 243  255  237      There is no height or weight on file  to calculate BMI.  Orders:  No orders of the defined types were placed in this encounter.  No orders of the defined types were placed in this encounter.    Procedures: No procedures performed  Clinical Data: No additional findings.  ROS:  All other systems negative, except as noted in the HPI. Review of Systems  Objective: Vital Signs: There were no vitals taken for this visit.  Specialty Comments:  No specialty comments available.  PMFS History: Patient Active Problem List   Diagnosis Date Noted   Primary osteoarthritis of left knee 10/03/2023   Right hip pain 07/14/2023   Coping style affecting medical condition 06/05/2023   Spinal cord injury, cervical region, sequela (HCC) 05/30/2023   Cervical vertebral fracture (HCC) 05/25/2023   History of GI bleed 05/25/2023   C4 cervical fracture (HCC) 05/25/2023   Duodenitis    Gastritis and gastroduodenitis     Ischemic colitis (HCC)    Bright red blood per rectum    ABLA (acute blood loss anemia)    Gastric ulcer    Duodenal ulcer    Diverticulosis of colon without hemorrhage    GI bleed 09/17/2021   Acute upper GI bleeding 09/11/2021   Mucosal abnormality of esophagus    Mucosal abnormality of stomach    Mucosal abnormality of duodenum    Upper GI bleed    Left hip pain 11/16/2020   Type 2 diabetes mellitus with other specified complication (HCC) 11/14/2020   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/14/2020   Primary osteoarthritis of left hip 10/07/2020   Status post reverse total arthroplasty of right shoulder 08/24/2020   Diverticulitis 03/08/2020   ESRD (end stage renal disease) (HCC) 03/08/2020   Polyneuropathy in diseases classified elsewhere (HCC) 03/08/2020   Pure hypercholesterolemia 03/08/2020   Sacral back pain 03/08/2020   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 11/10/2019   Acute gastric ulcer with hemorrhage    Acute GI bleeding 07/31/2019   Hypotension of hemodialysis 07/31/2019   Diverticulitis large intestine 07/31/2019   Mass of soft tissue of shoulder 05/26/2019   Awaiting organ transplant status 03/18/2019   Finding of cocaine in blood 02/25/2019   Anaphylactic shock, unspecified, sequela 11/21/2018   Hypercalcemia 10/28/2018   Diarrhea, unspecified 08/08/2018   Hyperkalemia 03/12/2018   Renal cell carcinoma of left kidney (HCC) 12/24/2017   Fluid overload, unspecified 07/06/2017   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   Other specified disorders of kidney and ureter 11/22/2016   Secondary hyperparathyroidism of renal origin (HCC) 10/23/2016   Headache disorder 08/21/2016   TIA (transient ischemic attack)    Essential hypertension    Seizure (HCC)    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    Lung nodule    Diverticulitis of cecum 07/21/2015   Diverticulitis of large intestine without perforation or abscess without bleeding    Right  lower quadrant pain    Obstructive sleep apnea 02/08/2015   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   Diverticulosis 07/13/2014   Lower GI bleed 07/12/2014   Refusal of blood transfusions as patient is Jehovah's Witness    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   HLD (hyperlipidemia)    PRES (posterior reversible encephalopathy syndrome)    History of CVA (cerebrovascular accident) 05/23/2014   Anemia of chronic disease 05/23/2014   GERD (gastroesophageal reflux disease) 12/09/2012   Gastrointestinal bleeding 11/12/2012   Melena 11/12/2012   Other psychoactive substance dependence, uncomplicated (HCC) 10/08/2012  Iron deficiency anemia, unspecified 06/10/2012   Moderate protein-calorie malnutrition (HCC) 07/29/2009   Coagulation defect, unspecified (HCC) 08/29/2006   Type 2 diabetes mellitus with diabetic peripheral angiopathy without gangrene (HCC) 08/29/2006   Hypertensive chronic kidney disease with stage 5 chronic kidney disease or end stage renal disease (HCC) 08/02/2006   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    Parathyroid  abnormality (HCC)    ectopic parathyroid  gland   Presence of arteriovenous fistula for hemodialysis, primary (HCC)    RUE PER PT RLE   Refusal of blood product    NO WHOLE BLOOD PROUCTS   Renal cell carcinoma (HCC)    s/p hand assisted laparoscopic bilateral nephrectomies  11/29/17, + RCC left   Secondary hyperparathyroidism (HCC)    Seizures (HCC)    one episode in past, due to elevated Potassium 08/02/20- at least 4 years ago   Sleep apnea    doesn't use CPAP anymore since weight loss   Stroke (HCC)    no residual    Family History  Problem Relation Age of Onset   Diabetes Father    Stroke Father    Hypertension Father    Uterine cancer Mother    Lupus Sister    Stroke Sister    Hypertension Sister    Anuerysm Brother        brain   Colon cancer Neg Hx    Esophageal cancer Neg Hx    Stomach cancer Neg Hx    Pancreatic cancer Neg Hx    Liver disease Neg Hx    Colon polyps Neg Hx    Rectal cancer Neg Hx     Past Surgical History:  Procedure Laterality Date   ANTERIOR CERVICAL CORPECTOMY N/A 05/25/2023   Procedure: ANTERIOR CERVICAL CORPECTOMY CERVICAL FOUR, CERVICAL THREE - CERVICAL FIVE FUSION;  Surgeon: [name] Shove, MD;  Location: [facility] OR;  Service: Neurosurgery;  Laterality: N/A;   AV FISTULA PLACEMENT Right    right arm   BIOPSY  08/01/2019   Procedure: BIOPSY;  Surgeon: Kristie Lamprey, MD;  Location: [facility] ENDOSCOPY;  Service: Endoscopy;;   BIOPSY  11/18/2020   Procedure: BIOPSY;  Surgeon: Stacia Glendia BRAVO, MD;  Location: [facility] ENDOSCOPY;  Service: Gastroenterology;;   BIOPSY  09/13/2021   Procedure: BIOPSY;  Surgeon: Wilhelmenia Aloha Raddle., MD;  Location: [facility] ENDOSCOPY;  Service: Gastroenterology;;   CATARACT EXTRACTION W/ INTRAOCULAR LENS  IMPLANT, BILATERAL     COLON SURGERY     COLONOSCOPY N/A 08/04/2015   Procedure: COLONOSCOPY;  Surgeon: Gordy CHRISTELLA Starch, MD;  Location: [facility] ENDOSCOPY;  Service: Endoscopy;  Laterality: N/A;   COLONOSCOPY  2017   JMP@ [facility]-good prep-mass -recall 1 yr   COLONOSCOPY N/A 09/13/2021   Procedure: COLONOSCOPY;  Surgeon: Mansouraty, Aloha Raddle., MD;  Location: [facility]  ENDOSCOPY;  Service: Gastroenterology;  Laterality: N/A;   COLONOSCOPY WITH PROPOFOL  N/A 11/25/2020   Procedure: COLONOSCOPY WITH PROPOFOL ;  Surgeon:  Federico Rosario BROCKS, MD;  Location: [facility] ENDOSCOPY;  Service: Gastroenterology;  Laterality: N/A;   COLONOSCOPY WITH PROPOFOL  N/A 09/23/2021   Procedure: COLONOSCOPY WITH PROPOFOL ;  Surgeon: San Sandor GAILS, DO;  Location: [facility] ENDOSCOPY;  Service: Gastroenterology;  Laterality: N/A;   ENTEROSCOPY N/A 09/23/2021   Procedure: ENTEROSCOPY;  Surgeon: San Sandor GAILS, DO;  Location: [facility] ENDOSCOPY;  Service: Gastroenterology;  Laterality: N/A;  Will place order for video capsule study as we may opt to place it during procedure   ESOPHAGOGASTRODUODENOSCOPY N/A 08/01/2019   Procedure: ESOPHAGOGASTRODUODENOSCOPY (EGD);  Surgeon: Kristie Lamprey, MD;  Location: [facility] ENDOSCOPY;  Service: Endoscopy;  Laterality: N/A;   ESOPHAGOGASTRODUODENOSCOPY N/A 11/18/2020   Procedure: ESOPHAGOGASTRODUODENOSCOPY (EGD);  Surgeon: Stacia Glendia BRAVO, MD;  Location: [facility] ENDOSCOPY;  Service: Gastroenterology;  Laterality: N/A;   ESOPHAGOGASTRODUODENOSCOPY (EGD) WITH PROPOFOL  N/A 08/04/2019   Procedure: ESOPHAGOGASTRODUODENOSCOPY (EGD) WITH PROPOFOL ;  Surgeon: Leigh Elspeth SQUIBB, MD;  Location: [facility] ENDOSCOPY;  Service: Gastroenterology;  Laterality: N/A;   ESOPHAGOGASTRODUODENOSCOPY (EGD) WITH PROPOFOL  N/A 09/13/2021   Procedure: ESOPHAGOGASTRODUODENOSCOPY (EGD) WITH PROPOFOL ;  Surgeon: Wilhelmenia Aloha Raddle., MD;  Location: [facility] ENDOSCOPY;  Service: Gastroenterology;  Laterality: N/A;   GIVENS CAPSULE STUDY N/A 09/23/2021   Procedure: GIVENS CAPSULE STUDY;  Surgeon: San Sandor GAILS, DO;  Location: [facility] ENDOSCOPY;  Service: Gastroenterology;  Laterality: N/A;   graft left arm Left    for dialysis x 2. Removed   HOT HEMOSTASIS  11/18/2020   Procedure: HOT HEMOSTASIS (ARGON PLASMA COAGULATION/BICAP);  Surgeon: Stacia Glendia BRAVO, MD;  Location: [facility] ENDOSCOPY;  Service: Gastroenterology;;   HOT HEMOSTASIS N/A 09/23/2021   Procedure: HOT HEMOSTASIS (ARGON PLASMA COAGULATION/BICAP);  Surgeon: San Sandor GAILS, DO;  Location: [facility] ENDOSCOPY;   Service: Gastroenterology;  Laterality: N/A;   INSERTION OF DIALYSIS CATHETER     Rt chest   LAPAROSCOPIC RIGHT COLECTOMY N/A 08/05/2015   Procedure: LAPAROSCOPIC RIGHT COLECTOMY- ASCENDING;  Surgeon: Jina Nephew, MD;  Location: [facility] OR;  Service: General;  Laterality: N/A;   MASS EXCISION Left 05/28/2019   Procedure: EXCISION SOFT TISSUE MASS LEFT SHOULDER;  Surgeon: Eletha Boas, MD;  Location: [facility] ORS;  Service: General;  Laterality: Left;   NEPHRECTOMY Bilateral    PARATHYROIDECTOMY N/A 06/12/2016   Procedure: TOTAL PARATHYROIDECTOMY WITH AUTOTRANSPLANTATION TO LEFT FOREARM;  Surgeon: Boas Eletha, MD;  Location: [facility] OR;  Service: General;  Laterality: N/A;   PARATHYROIDECTOMY N/A 10/23/2016   Procedure: PARATHYROIDECTOMY;  Surgeon: Eletha Boas, MD;  Location: [facility] OR;  Service: General;  Laterality: N/A;   REVERSE SHOULDER ARTHROPLASTY Right 08/24/2020   Procedure: REVERSE SHOULDER ARTHROPLASTY;  Surgeon: Cristy Bonner DASEN, MD;  Location: [facility] OR;  Service: Orthopedics;  Laterality: Right;   REVISON OF ARTERIOVENOUS FISTULA Right 07/16/2017   Procedure: REVISION OF ARTERIOVENOUS FISTULA  Right ARM;  Surgeon: Sheree Penne Bruckner, MD;  Location: [facility] OR;  Service: Vascular;  Laterality: Right;   TOTAL HIP ARTHROPLASTY Left 11/14/2020   Procedure: LEFT TOTAL HIP ARTHROPLASTY ANTERIOR APPROACH;  Surgeon: [name] Kay HERO, MD;  Location: [facility] OR;  Service: Orthopedics;  Laterality: Left;   UPPER GASTROINTESTINAL ENDOSCOPY  2021   @ [facility]   Social History   Occupational History   Occupation: retired    Associate Professor: SELF EMPLOYED  Tobacco Use   Smoking status: Former    Current packs/day: 0.00    Types: Cigarettes    Quit date:  06/19/1990    Years since quitting: 33.3   Smokeless tobacco: Never   Tobacco comments:    rarely cigar  Vaping Use   Vaping status: Never Used  Substance and Sexual Activity   Alcohol use: Not Currently    Alcohol/week: 1.0 standard drink of alcohol    Types: 1 Cans of beer  per week    Comment: occasional beer   Drug use: Not Currently    Types: Marijuana    Comment: Last use several yrs ago   Sexual activity: Not Currently

## 2023-11-03 ENCOUNTER — Encounter: Payer: Self-pay | Admitting: Physician Assistant

## 2023-11-04 DIAGNOSIS — D689 Coagulation defect, unspecified: Secondary | ICD-10-CM | POA: Diagnosis not present

## 2023-11-04 DIAGNOSIS — N186 End stage renal disease: Secondary | ICD-10-CM | POA: Diagnosis not present

## 2023-11-04 DIAGNOSIS — Z992 Dependence on renal dialysis: Secondary | ICD-10-CM | POA: Diagnosis not present

## 2023-11-04 DIAGNOSIS — D631 Anemia in chronic kidney disease: Secondary | ICD-10-CM | POA: Diagnosis not present

## 2023-11-04 DIAGNOSIS — R197 Diarrhea, unspecified: Secondary | ICD-10-CM | POA: Diagnosis not present

## 2023-11-04 DIAGNOSIS — N2581 Secondary hyperparathyroidism of renal origin: Secondary | ICD-10-CM | POA: Diagnosis not present

## 2023-11-05 ENCOUNTER — Ambulatory Visit: Admitting: Physical Therapy

## 2023-11-05 ENCOUNTER — Ambulatory Visit: Admitting: Podiatry

## 2023-11-05 ENCOUNTER — Encounter: Payer: Self-pay | Admitting: Physical Therapy

## 2023-11-05 DIAGNOSIS — R2681 Unsteadiness on feet: Secondary | ICD-10-CM

## 2023-11-05 DIAGNOSIS — R262 Difficulty in walking, not elsewhere classified: Secondary | ICD-10-CM | POA: Diagnosis not present

## 2023-11-05 DIAGNOSIS — R2689 Other abnormalities of gait and mobility: Secondary | ICD-10-CM | POA: Diagnosis not present

## 2023-11-05 DIAGNOSIS — R296 Repeated falls: Secondary | ICD-10-CM

## 2023-11-05 DIAGNOSIS — M25511 Pain in right shoulder: Secondary | ICD-10-CM | POA: Diagnosis not present

## 2023-11-05 DIAGNOSIS — M6281 Muscle weakness (generalized): Secondary | ICD-10-CM | POA: Diagnosis not present

## 2023-11-05 DIAGNOSIS — G8929 Other chronic pain: Secondary | ICD-10-CM | POA: Diagnosis not present

## 2023-11-05 DIAGNOSIS — M25611 Stiffness of right shoulder, not elsewhere classified: Secondary | ICD-10-CM | POA: Diagnosis not present

## 2023-11-05 DIAGNOSIS — M25561 Pain in right knee: Secondary | ICD-10-CM | POA: Diagnosis not present

## 2023-11-05 NOTE — Therapy (Signed)
 OUTPATIENT PHYSICAL THERAPY LOWER EXTREMITY TREATMENT Progress Note Reporting Period 09/12/23 to 11/05/23  See note below for Objective Data and Assessment of Progress/Goals.      Patient Name: Carl Braver Sr. MRN: [id number] DOB:[birth year], 63 y.o., male Today's Date: 11/05/2023  END OF SESSION:  PT End of Session - 11/05/23 0904     Visit Number 10    Date for Recertification  12/05/23    PT Start Time 0900    PT Stop Time 0930    PT Time Calculation (min) 30 min    Activity Tolerance Patient tolerated treatment well    Behavior During Therapy [facility] for tasks assessed/performed           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    Parathyroid  abnormality    ectopic parathyroid  gland   Presence of arteriovenous fistula for hemodialysis, primary    RUE PER PT RLE   Refusal of blood product    NO WHOLE BLOOD PROUCTS   Renal cell carcinoma (HCC)    s/p hand assisted laparoscopic bilateral nephrectomies 11/29/17, + RCC left   Secondary hyperparathyroidism    Seizures (HCC)    one episode in past, due to elevated Potassium 08/02/20- at least 4 years ago   Sleep apnea    doesn't use CPAP anymore since weight loss   Stroke [facility])    no residual   Past Surgical History:  Procedure Laterality Date   ANTERIOR CERVICAL CORPECTOMY N/A 05/25/2023   Procedure: ANTERIOR CERVICAL CORPECTOMY CERVICAL FOUR, CERVICAL THREE - CERVICAL FIVE FUSION;   Surgeon: [name] Shove, MD;  Location: [facility] OR;  Service: Neurosurgery;  Laterality: N/A;   AV FISTULA PLACEMENT Right    right arm   BIOPSY  08/01/2019   Procedure: BIOPSY;  Surgeon: Kristie Lamprey, MD;  Location: [facility] ENDOSCOPY;  Service: Endoscopy;;   BIOPSY  11/18/2020   Procedure: BIOPSY;  Surgeon: Stacia Glendia BRAVO, MD;  Location: [facility] ENDOSCOPY;  Service: Gastroenterology;;   BIOPSY  09/13/2021   Procedure: BIOPSY;  Surgeon: Wilhelmenia Aloha Raddle., MD;  Location: [facility] ENDOSCOPY;  Service: Gastroenterology;;   CATARACT EXTRACTION W/ INTRAOCULAR LENS  IMPLANT, BILATERAL     COLON SURGERY     COLONOSCOPY N/A 08/04/2015   Procedure: COLONOSCOPY;  Surgeon: Gordy CHRISTELLA Starch, MD;  Location: [facility] ENDOSCOPY;  Service: Endoscopy;  Laterality: N/A;   COLONOSCOPY  2017   JMP@ [facility]-good prep-mass -recall 1 yr   COLONOSCOPY N/A 09/13/2021   Procedure: COLONOSCOPY;  Surgeon: Mansouraty, Aloha Raddle., MD;  Location: [facility] ENDOSCOPY;  Service: Gastroenterology;  Laterality: N/A;   COLONOSCOPY WITH PROPOFOL  N/A 11/25/2020   Procedure: COLONOSCOPY WITH PROPOFOL ;  Surgeon: Federico Rosario BROCKS, MD;  Location: [facility] ENDOSCOPY;  Service: Gastroenterology;  Laterality: N/A;   COLONOSCOPY WITH PROPOFOL  N/A 09/23/2021  Procedure: COLONOSCOPY WITH PROPOFOL ;  Surgeon: San Sandor GAILS, DO;  Location: [facility] ENDOSCOPY;  Service: Gastroenterology;  Laterality: N/A;   ENTEROSCOPY N/A 09/23/2021   Procedure: ENTEROSCOPY;  Surgeon: San Sandor GAILS, DO;  Location: [facility] ENDOSCOPY;  Service: Gastroenterology;  Laterality: N/A;  Will place order for video capsule study as we may opt to place it during procedure   ESOPHAGOGASTRODUODENOSCOPY N/A 08/01/2019   Procedure: ESOPHAGOGASTRODUODENOSCOPY (EGD);  Surgeon: Kristie Lamprey, MD;  Location: [facility] ENDOSCOPY;  Service: Endoscopy;  Laterality: N/A;   ESOPHAGOGASTRODUODENOSCOPY N/A 11/18/2020   Procedure: ESOPHAGOGASTRODUODENOSCOPY (EGD);  Surgeon: Stacia Glendia BRAVO, MD;  Location: [facility] ENDOSCOPY;  Service:  Gastroenterology;  Laterality: N/A;   ESOPHAGOGASTRODUODENOSCOPY (EGD) WITH PROPOFOL  N/A 08/04/2019   Procedure: ESOPHAGOGASTRODUODENOSCOPY (EGD) WITH PROPOFOL ;  Surgeon: Leigh Elspeth SQUIBB, MD;  Location: [facility] ENDOSCOPY;  Service: Gastroenterology;  Laterality: N/A;   ESOPHAGOGASTRODUODENOSCOPY (EGD) WITH PROPOFOL  N/A 09/13/2021   Procedure: ESOPHAGOGASTRODUODENOSCOPY (EGD) WITH PROPOFOL ;  Surgeon: Wilhelmenia Aloha Raddle., MD;  Location: [facility] ENDOSCOPY;  Service: Gastroenterology;  Laterality: N/A;   GIVENS CAPSULE STUDY N/A 09/23/2021   Procedure: GIVENS CAPSULE STUDY;  Surgeon: San Sandor GAILS, DO;  Location: [facility] ENDOSCOPY;  Service: Gastroenterology;  Laterality: N/A;   graft left arm Left    for dialysis x 2. Removed   HOT HEMOSTASIS  11/18/2020   Procedure: HOT HEMOSTASIS (ARGON PLASMA COAGULATION/BICAP);  Surgeon: Stacia Glendia BRAVO, MD;  Location: [facility] ENDOSCOPY;  Service: Gastroenterology;;   HOT HEMOSTASIS N/A 09/23/2021   Procedure: HOT HEMOSTASIS (ARGON PLASMA COAGULATION/BICAP);  Surgeon: San Sandor GAILS, DO;  Location: [facility] ENDOSCOPY;  Service: Gastroenterology;  Laterality: N/A;   INSERTION OF DIALYSIS CATHETER     Rt chest   LAPAROSCOPIC RIGHT COLECTOMY N/A 08/05/2015   Procedure: LAPAROSCOPIC RIGHT COLECTOMY- ASCENDING;  Surgeon: Jina Nephew, MD;  Location: [facility] OR;  Service: General;  Laterality: N/A;   MASS EXCISION Left 05/28/2019   Procedure: EXCISION SOFT TISSUE MASS LEFT SHOULDER;  Surgeon: Eletha Boas, MD;  Location: [facility] ORS;  Service: General;  Laterality: Left;   NEPHRECTOMY Bilateral    PARATHYROIDECTOMY N/A 06/12/2016   Procedure: TOTAL PARATHYROIDECTOMY WITH AUTOTRANSPLANTATION TO LEFT FOREARM;  Surgeon: Boas Eletha, MD;  Location: [facility] OR;  Service: General;  Laterality: N/A;   PARATHYROIDECTOMY N/A 10/23/2016   Procedure: PARATHYROIDECTOMY;  Surgeon: Eletha Boas, MD;  Location: [facility] OR;  Service: General;  Laterality: N/A;   REVERSE SHOULDER ARTHROPLASTY Right 08/24/2020    Procedure: REVERSE SHOULDER ARTHROPLASTY;  Surgeon: Cristy Bonner DASEN, MD;  Location: [facility] OR;  Service: Orthopedics;  Laterality: Right;   REVISON OF ARTERIOVENOUS FISTULA Right 07/16/2017   Procedure: REVISION OF ARTERIOVENOUS FISTULA  Right ARM;  Surgeon: Sheree Penne Bruckner, MD;  Location: [facility] OR;  Service: Vascular;  Laterality: Right;   TOTAL HIP ARTHROPLASTY Left 11/14/2020   Procedure: LEFT TOTAL HIP ARTHROPLASTY ANTERIOR APPROACH;  Surgeon: [name] Kay HERO, MD;  Location: [facility] OR;  Service: Orthopedics;  Laterality: Left;   UPPER GASTROINTESTINAL ENDOSCOPY  2021   @ [facility]   Patient Active Problem List   Diagnosis Date Noted   Primary osteoarthritis of left knee 10/03/2023   Right hip pain 07/14/2023   Coping style affecting medical condition 06/05/2023   Spinal cord injury, cervical region, sequela 05/30/2023   Cervical vertebral fracture (HCC) 05/25/2023   History of GI bleed 05/25/2023   C4 cervical fracture (HCC) 05/25/2023   Duodenitis    Gastritis and gastroduodenitis    Ischemic colitis    Bright red blood per rectum  ABLA (acute blood loss anemia)    Gastric ulcer    Duodenal ulcer    Diverticulosis of colon without hemorrhage    GI bleed 09/17/2021   Acute upper GI bleeding 09/11/2021   Mucosal abnormality of esophagus    Mucosal abnormality of stomach    Mucosal abnormality of duodenum    Upper GI bleed    Left hip pain 11/16/2020   Type 2 diabetes mellitus with other specified complication (HCC) 11/14/2020   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/14/2020   Primary osteoarthritis of left hip 10/07/2020   Status post reverse total arthroplasty of right shoulder 08/24/2020   Diverticulitis 03/08/2020   ESRD (end stage renal disease) (HCC) 03/08/2020   Polyneuropathy in diseases classified elsewhere 03/08/2020   Pure hypercholesterolemia 03/08/2020   Sacral back pain 03/08/2020   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 11/10/2019   Acute gastric ulcer with  hemorrhage    Acute GI bleeding 07/31/2019   Hypotension of hemodialysis 07/31/2019   Diverticulitis large intestine 07/31/2019   Mass of soft tissue of shoulder 05/26/2019   Awaiting organ transplant status 03/18/2019   Finding of cocaine in blood 02/25/2019   Anaphylactic shock, unspecified, sequela 11/21/2018   Hypercalcemia 10/28/2018   Diarrhea, unspecified 08/08/2018   Hyperkalemia 03/12/2018   Renal cell carcinoma of left kidney (HCC) 12/24/2017   Fluid overload, unspecified 07/06/2017   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   Other specified disorders of kidney and ureter 11/22/2016   Secondary hyperparathyroidism of renal origin 10/23/2016   Headache disorder 08/21/2016   TIA (transient ischemic attack)    Essential hypertension    Seizure (HCC)    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    Lung nodule    Diverticulitis of cecum 07/21/2015   Diverticulitis of large intestine without perforation or abscess without bleeding    Right lower quadrant pain    Obstructive sleep apnea 02/08/2015   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   Diverticulosis 07/13/2014   Lower GI bleed 07/12/2014   Refusal of blood transfusions as patient is Jehovah's Witness    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   HLD (hyperlipidemia)    PRES (posterior reversible encephalopathy syndrome)    History of CVA (cerebrovascular accident) 05/23/2014   Anemia of chronic disease 05/23/2014   GERD (gastroesophageal reflux disease) 12/09/2012   Gastrointestinal bleeding 11/12/2012   Melena 11/12/2012   Other psychoactive substance dependence, uncomplicated (HCC) 10/08/2012   Iron deficiency anemia, unspecified 06/10/2012   Moderate protein-calorie malnutrition 07/29/2009   Coagulation defect, unspecified 08/29/2006   Type 2 diabetes mellitus with diabetic peripheral angiopathy without gangrene (HCC) 08/29/2006    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 stage renal disease (HCC) 08/02/2006    PCP: [name] MD   REFERRING PROVIDER: Debby Fidela CROME, NP  REFERRING DIAG:  Diagnosis  S14.109S (ICD-10-CM) - Spinal cord injury, cervical region, sequela (HCC)    THERAPY DIAG:  Difficulty in walking, not elsewhere classified  Muscle weakness (generalized)  Unsteadiness on feet  Repeated falls  Rationale for Evaluation and Treatment: Rehabilitation  ONSET DATE:  ACDF 05/25/23  SUBJECTIVE:   SUBJECTIVE STATEMENT: Can't complain, r ankle is hurting, went to urgent care Sunday for ankle and was informed it may be a vascular problem PERTINENT HISTORY: See above   ACDF C3-C5 fusion, anterior cervical corpectomy C3-C4  PAIN:  Are you having pain? 6/10 R ankle  PRECAUTIONS:  Precautions:  Precautions Precautions: Fall, Cervical, Other (comment) Recall of Precautions/Restrictions: Impaired Precaution/Restrictions Comments: Verbally reviewed cervical precautions. Required Braces or Orthoses: Cervical Brace Cervical Brace: Soft collar, At all times Restrictions Weight Bearing Restrictions Per Provider Order: No General: Pain: No c/o pain.   RED FLAGS: None   WEIGHT BEARING RESTRICTIONS: No  FALLS:  Has patient fallen in last 6 months? Yes. Number of falls 3- one that led to surgery, then 2 after getting home from the hospital  LIVING ENVIRONMENT: Lives with: lives with their son Lives in: House/apartment Stairs: ground floor apt  Has following equipment at home: Single point cane, Environmental consultant - 2 wheeled, and Environmental consultant - 4 wheeled  OCCUPATION: retired- used to pressure wash   PLOF: Independent, Independent with basic ADLs, Independent with gait, and Independent with transfers  PATIENT GOALS: get back to normal ability, drive convertible again   NEXT MD VISIT: Dr. Cornelio 11/15/23  OBJECTIVE:  Note: Objective measures were completed at  Evaluation unless otherwise noted.  DIAGNOSTIC FINDINGS:    CLINICAL DATA:  63 year old male with altered mental status.   EXAM: CT HEAD WITHOUT CONTRAST   TECHNIQUE: Contiguous axial images were obtained from the base of the skull through the vertex without intravenous contrast.   RADIATION DOSE REDUCTION: This exam was performed according to the departmental dose-optimization program which includes automated exposure control, adjustment of the mA and/or kV according to patient size and/or use of iterative reconstruction technique.   COMPARISON:  Brain MRI and head CT 05/25/2023.   FINDINGS: Brain:   Unusual extensive, diffuse pachymeningeal calcification which has been present since at least 2013. Stable cerebral volume. No midline shift, ventriculomegaly, mass effect, evidence of mass lesion, intracranial hemorrhage or evidence of cortically based acute infarction. Small area of chronic encephalomalacia in the medial left occipital lobe redemonstrated. Stable gray-white matter differentiation throughout the brain.   Vascular: Chronic severe pachymeningeal calcification.   Skull: Appears stable.  No acute osseous abnormality identified.   Sinuses/Orbits: Continued left middle ear and mastoid opacification. Other Visualized paranasal sinuses and mastoids are stable and well aerated.   Other: Anterior left scalp soft tissue injury has regressed since earlier in the month. Orbits soft tissues appears stable.   IMPRESSION: 1. No acute intracranial abnormality. 2. 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 3. Ongoing left middle ear and mastoid opacification. No acute osseous abnormality identified. 4. Regressed scalp soft tissue injury since 05/25/2023.  CLINICAL DATA:  Chronic bilateral knee pain and swelling.   EXAM: RIGHT KNEE - COMPLETE 4+ VIEW; LEFT KNEE - COMPLETE 4+ VIEW   COMPARISON:  Right  knee radiographs dated 12/26/2021.   FINDINGS: Right knee:   No acute fracture or dislocation. There is a small to moderate-sized joint effusion. Moderate tricompartmental degenerative changes with joint space narrowing and marginal osteophytosis. Vascular calcifications are noted.   Left knee:   No acute fracture or dislocation. There is a small joint effusion. Moderate tricompartmental degenerative changes with joint space narrowing and marginal osteophytosis. Vascular calcifications are noted.   IMPRESSION: 1. No acute osseous abnormality. 2. 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  Narrative & Impression  CLINICAL DATA:  Chronic bilateral knee pain and swelling.   EXAM: RIGHT KNEE - COMPLETE 4+ VIEW; LEFT KNEE - COMPLETE 4+ VIEW   COMPARISON:  Right knee radiographs dated 12/26/2021.   FINDINGS: Right knee:   No acute fracture or dislocation.  There is a small to moderate-sized joint effusion. Moderate tricompartmental degenerative changes with joint space narrowing and marginal osteophytosis. Vascular calcifications are noted.   Left knee:   No acute fracture or dislocation. There is a small joint effusion. Moderate tricompartmental degenerative changes with joint space narrowing and marginal osteophytosis. Vascular calcifications are noted.   IMPRESSION: 1. No acute osseous abnormality. 2. 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    Narrative & Impression CLINICAL DATA:  Right hip pain.   EXAM: DG HIP (WITH OR WITHOUT PELVIS) 2-3V RIGHT   COMPARISON:  None Available.   FINDINGS: No acute fracture or dislocation. The bones are osteopenic. Cystic changes of the right femoral head and neck measure up to 3.7 cm. Orthopedic referral is advised. Mild arthritic changes of the right hip. Total left  hip arthroplasty. The soft tissues are unremarkable. Vascular calcifications.   IMPRESSION: 1. No acute fracture or dislocation. 2. Cystic changes of the right femoral head and neck. Orthopedic referral is advised.  Lower Venous DVT Study   Patient Name:  Carl Burkemper Sr.  Date of Exam:   06/10/2023  Medical Rec #: [id number]            Accession #:    [id number]  Date of Birth: [birth year]             Patient Gender: M  Patient Age:   72 years  Exam Location:  [facility]  Procedure:      VAS US  LOWER EXTREMITY VENOUS (DVT)  Referring Phys: PRENTICE COMPTON    - PATIENT SURVEYS:  PSFS: THE PATIENT SPECIFIC FUNCTIONAL SCALE  Place score of 0-10 (0 = unable to perform activity and 10 = able to perform activity at the same level as before injury or problem)  Activity Date: eval 09/12/23 11/05/23   Getting in and out of car (anything lower than 20 inches)  0    2. Balance  3    3. Walking without a device  2    4.  Social activities  0    Total Score 1.25      Total Score = Sum of activity scores/number of activities  Minimally Detectable Change: 3 points (for single activity); 2 points (for average score)  [employer] [facility] (nd). The Patient Specific Functional Scale . Retrieved from [email or web address]   COGNITION: Overall cognitive status: Within functional limits for tasks assessed     SENSATION: Hx of neuropathy even before fall/surgery     LOWER EXTREMITY MMT:  MMT Right eval Left eval Right 10/24/23 Left 10/24/23 RT/Left 10/31/23 RT/L 11/05/23  Hip flexion 3- 3+ 3 3+ 3/3+ 3+/4  Hip extension        Hip abduction        Hip adduction        Hip internal rotation        Hip external rotation        Knee flexion 3- 4+ 4- 5 4/5 4/5  Knee extension 3- 5 3- 5 3-/5 3-/5  Ankle dorsiflexion 4 5 5 5     Ankle plantarflexion        Ankle inversion        Ankle eversion         (Blank rows = not  tested)    GAIT: Distance walked: in clinic distances  Assistive device utilized: Environmental consultant - 2 wheeled Level of assistance: Modified independence Comments: slow but steady with RW  TREATMENT DATE:  11/05/23 NuStep L5 x 6 min Goals  Sit to stands w/ UE from elevated mat 2x10 6in step ups with UE assit x10 on L, x5 on R  10/31/23 Nustep L 5 STGs assessed including MMT Leg Press 40# 2 sets 10, SL 10 x BIL 20#, AA with RT to start 6 inch step up with UE support 10 x BIL CG- min A 4 inch step down with UE 10 xBIL CG-min A STS no hands CG-min A with cuing 28 in , could not do lower 10 x  10/29/23 Nustep L 5 BIL ankle red tband 4 way 10x 2 sets  Green tband HS curl 2 sets 10 LAQ 3# 2 sets 10 Standing with RW 3# 10 x SL hip flex,ext and abd,marching and HS curl Leg press 40# 2 sets 10. 20# SL 10 x BIL , assist to start on RT Green tband HS curl 2 sets 10     10/24/23 NuStep L 5 x 6 min Goals  Stairs heavy UE use come SGA alt pattern 2 rails Sit to stand from elevated surface with compensation due to weakness LAQ 2lb 2x10  Eccentrics with RLE Gait 50 feet No AD CGA   Decrease heel strike and step length   10/22/23 Nustep L 5, 6 min Standing reaching outside base of support  Side steps at mat able  Leg press 40lb 2x10   SL 20lb x10 each assist need with RLE  Gait 59ft CGA cues for erect posture   Then 51ft LAQ 2lb 2x10  Eccentrics with RLE  10/16/23:  Nustep L 5, 6 min Lat rows(airex in seat) 20# 2 x 10 Rows standing 15# 2 x 10 Knee ext B 5# 2 x 10 Knee flexion 25# 2 x 10, 35# 1 x 10 Sit to stand from elevated mat 7 x Chest presses for, side, side 10 x each blue mediball   10/10/23 Nustep L 5 Green tband HS curls 2 sets 10 3# LAQ 2 sets 10 3# standing with RW 10 x BIL-marching,hip flex,ext ,abd and HS curl HHA 3# marching  fwd and back 15 feet 2 x each HHA 3# side stepping 15 feet 2 x each Leg Press 20# BIL 10 x seat on 9, SL 10 x each 20 #  left leg seat on 11, RT needs assist to start mvmt. 20# BIL 10 x seat 8  10/08/23 Nustep L 5 Green tband HS curl 2 sets 10 2# LAQ 2 sets 10- quad lag BIL RT > left Standing with RW 2# marching,SL hip flex,ext and abd 20 x alt STS elevated mat 27.5  inches 2 sets 5 hands on knee, min A Amb with SPC CGA 100 feet with 2 slight sways but reqained Step up 4 in with RW 10 x BIL Step up 4 in with airex min A on RT 2 sets 5, left 10 x   10/01/23 UBE L 3 2 min fwd and back NuStep L 5 x 6 min STS elevated mat 27.5  inches 2 sets 5 hands on knee, CGA SLS HS curls 10 x 15# RT, left 10x 20#, BIL 25# 10 x Knee ext 5# LLE 2 sets 5 RT knee LAQ 3 sets 5     09/25/23 NuStep L 5 x 6 min LAQ RLE no weight, LLE 3lb cuff 2x10  Seated match 3lb 2x10 HS curls green 2x15 Sit to stand form elevated surface 2x5 with UE use Side steps at mat  Standing march in [facility]  09/12/23  Eval, POC      PATIENT EDUCATION:  Education details: exam findings, POC Person educated: Patient Education method: Explanation Education comprehension: verbalized understanding, returned demonstration, and needs further education  HOME EXERCISE PROGRAM: Educ on getting ankle wts, adjustable up to 5 is rec as we used 3 #- okayed doing seated and standing ex with RW  ASSESSMENT:  CLINICAL IMPRESSION: Pt present for 10th visit. HE enters ~ 16 minutes late. Objective measures taken. RLE remains weak functionally and with MMT.  Pt struggles with initial unlocking on knees for eccentric lowering with step ups. Same issue with sitting as he struggles with bending knees and eccentric lowering.    Patient is a 63 y.o. M who was seen today for physical therapy evaluation and treatment for S14.109S (ICD-10-CM) - Spinal cord injury, cervical region, sequela (HCC) Pt tolerated a initial progression to therapy  well. RLE is significantly weaker than R. Pt had log legs and a relatively short torso making it difficult to stand from low surfaces in conjunction with weak LE. Decrease coordination with side steps. No pain during session. Limited ROM with RLE during isolated exercises.  Will make every effort to improve function and work towards goals.   OBJECTIVE IMPAIRMENTS: Abnormal gait, decreased activity tolerance, decreased balance, decreased coordination, decreased knowledge of use of DME, decreased mobility, difficulty walking, decreased strength, postural dysfunction, and pain.   ACTIVITY LIMITATIONS: standing, squatting, stairs, transfers, bed mobility, toileting, dressing, and locomotion level  PARTICIPATION LIMITATIONS: shopping, community activity, and yard work  PERSONAL FACTORS: Age, Behavior pattern, Education, Fitness, Past/current experiences, Profession, Social background, and Time since onset of injury/illness/exacerbation are also affecting patient's functional outcome.   REHAB POTENTIAL: Good  CLINICAL DECISION MAKING: Evolving/moderate complexity  EVALUATION COMPLEXITY: Moderate   GOALS: Goals reviewed with patient? No  SHORT TERM GOALS: Target date: 10/24/2023   Will be compliant with appropriate progressive HEP  Baseline: Goal status: 10/08/23 MET  2.  Will be able to consistently perform functional STS tranfers from surfaces 20 inches or less  Baseline:  Goal status:10/08/23 on going  10/31/23 progressing still difficult for pt, 11/05/23 progressing still difficult for pt uses his UE   3.  Will be able to ambulate at least 37ft with quad cane no more than light MinA  Baseline:  Goal status: 10/08/23 progressing  and 10/31/23  4.  MMT to be at least 3+/5 in all tested groups R LE  Baseline:  Goal status: ongoing 10/24/23  and 10/31/23    LONG TERM GOALS: Target date: 12/05/2023      R LE MMT to be at least 4-/5 in all tested groups  Baseline:  Goal status: ongoing  10/24/23, Ongoing 11/05/23  2.  Will score at least 18 on DGI with no device to show reduced fall risk  Baseline:  Goal status: INITIAL  3.  Will be able to reciprocally ascend/descend steps without difficulty and U rail  Baseline:  Goal status: Ongoing 10/24/23  4.  Will be able to ambulate community distances with LRAD, Mod(I) to allow for improved community access and participating in social activities  Baseline:  Goal status: Progressing 11/05/23  5.  Will be compliant with appropriate gym based exercise program by time of DC  Baseline:  Goal status: reports going to gym some with his son , using bicycle., Progressing 11/05/23 goes with his son    PLAN:  PT FREQUENCY: 2x/week  PT DURATION: 12 weeks  PLANNED INTERVENTIONS:  02249- Physical Performance Testing, [id number]-Therapeutic exercises, [address]7530- Therapeutic activity, [id number]- Neuromuscular re-education, 97535- Self Care, 97140- Manual therapy, and 97116- Gait training  PLAN FOR NEXT SESSION: progress func strength, gait and balance. Jon Payseur PTA  11/05/23 9:05 AM

## 2023-11-06 DIAGNOSIS — D631 Anemia in chronic kidney disease: Secondary | ICD-10-CM | POA: Diagnosis not present

## 2023-11-06 DIAGNOSIS — N186 End stage renal disease: Secondary | ICD-10-CM | POA: Diagnosis not present

## 2023-11-06 DIAGNOSIS — Z992 Dependence on renal dialysis: Secondary | ICD-10-CM | POA: Diagnosis not present

## 2023-11-06 DIAGNOSIS — D689 Coagulation defect, unspecified: Secondary | ICD-10-CM | POA: Diagnosis not present

## 2023-11-06 DIAGNOSIS — D509 Iron deficiency anemia, unspecified: Secondary | ICD-10-CM | POA: Diagnosis not present

## 2023-11-06 DIAGNOSIS — N2581 Secondary hyperparathyroidism of renal origin: Secondary | ICD-10-CM | POA: Diagnosis not present

## 2023-11-07 ENCOUNTER — Ambulatory Visit: Admitting: Physical Therapy

## 2023-11-07 ENCOUNTER — Encounter: Payer: Self-pay | Admitting: Physical Therapy

## 2023-11-07 DIAGNOSIS — R296 Repeated falls: Secondary | ICD-10-CM | POA: Diagnosis not present

## 2023-11-07 DIAGNOSIS — M25471 Effusion, right ankle: Secondary | ICD-10-CM | POA: Diagnosis not present

## 2023-11-07 DIAGNOSIS — M25561 Pain in right knee: Secondary | ICD-10-CM | POA: Diagnosis not present

## 2023-11-07 DIAGNOSIS — R262 Difficulty in walking, not elsewhere classified: Secondary | ICD-10-CM

## 2023-11-07 DIAGNOSIS — R2689 Other abnormalities of gait and mobility: Secondary | ICD-10-CM | POA: Diagnosis not present

## 2023-11-07 DIAGNOSIS — M6281 Muscle weakness (generalized): Secondary | ICD-10-CM

## 2023-11-07 DIAGNOSIS — G8929 Other chronic pain: Secondary | ICD-10-CM | POA: Diagnosis not present

## 2023-11-07 DIAGNOSIS — N186 End stage renal disease: Secondary | ICD-10-CM | POA: Diagnosis not present

## 2023-11-07 DIAGNOSIS — R2681 Unsteadiness on feet: Secondary | ICD-10-CM | POA: Diagnosis not present

## 2023-11-07 DIAGNOSIS — M25511 Pain in right shoulder: Secondary | ICD-10-CM | POA: Diagnosis not present

## 2023-11-07 DIAGNOSIS — M25611 Stiffness of right shoulder, not elsewhere classified: Secondary | ICD-10-CM | POA: Diagnosis not present

## 2023-11-07 NOTE — Therapy (Signed)
 OUTPATIENT PHYSICAL THERAPY LOWER EXTREMITY TREATMENT    Patient Name: [name]. MRN: [id number] DOB:26-Feb-1960, 63 y.o., male Today's Date: 11/07/2023  END OF SESSION:  PT End of Session - 11/07/23 0758     Visit Number 11    Number of Visits 25    PT Start Time 0758    PT Stop Time 0843    PT Time Calculation (min) 45 min    Activity Tolerance Patient tolerated treatment well    Behavior During Therapy [facility] for tasks assessed/performed           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    Parathyroid  abnormality    ectopic parathyroid  gland   Presence of arteriovenous fistula for hemodialysis, primary    RUE PER PT RLE   Refusal of blood product    NO WHOLE BLOOD PROUCTS   Renal cell carcinoma (HCC)    s/p hand assisted laparoscopic bilateral nephrectomies 11/29/17, + RCC left   Secondary hyperparathyroidism    Seizures (HCC)    one episode in past, due to elevated Potassium 08/02/20- at least 4 years ago   Sleep apnea    doesn't use CPAP anymore since weight loss   Stroke [facility])    no residual   Past Surgical History:  Procedure Laterality Date   ANTERIOR CERVICAL CORPECTOMY N/A 05/25/2023   Procedure: ANTERIOR CERVICAL CORPECTOMY CERVICAL FOUR, CERVICAL THREE - CERVICAL FIVE FUSION;  Surgeon: [name] Shove, MD;  Location: [facility] OR;  Service: Neurosurgery;  Laterality: N/A;   AV FISTULA PLACEMENT Right    right arm   BIOPSY   08/01/2019   Procedure: BIOPSY;  Surgeon: Kristie Lamprey, MD;  Location: [facility] ENDOSCOPY;  Service: Endoscopy;;   BIOPSY  11/18/2020   Procedure: BIOPSY;  Surgeon: Stacia Glendia BRAVO, MD;  Location: [facility] ENDOSCOPY;  Service: Gastroenterology;;   BIOPSY  09/13/2021   Procedure: BIOPSY;  Surgeon: Wilhelmenia Aloha Raddle., MD;  Location: [facility] ENDOSCOPY;  Service: Gastroenterology;;   CATARACT EXTRACTION W/ INTRAOCULAR LENS  IMPLANT, BILATERAL     COLON SURGERY     COLONOSCOPY N/A 08/04/2015   Procedure: COLONOSCOPY;  Surgeon: Gordy CHRISTELLA Starch, MD;  Location: [facility] ENDOSCOPY;  Service: Endoscopy;  Laterality: N/A;   COLONOSCOPY  2017   JMP@ [facility]-good prep-mass -recall 1 yr   COLONOSCOPY N/A 09/13/2021   Procedure: COLONOSCOPY;  Surgeon: Mansouraty, Aloha Raddle., MD;  Location: [facility] ENDOSCOPY;  Service: Gastroenterology;  Laterality: N/A;   COLONOSCOPY WITH PROPOFOL  N/A 11/25/2020   Procedure: COLONOSCOPY WITH PROPOFOL ;  Surgeon: Federico Rosario BROCKS, MD;  Location: [facility] ENDOSCOPY;  Service: Gastroenterology;  Laterality: N/A;   COLONOSCOPY WITH PROPOFOL  N/A 09/23/2021   Procedure: COLONOSCOPY WITH PROPOFOL ;  Surgeon: San Sandor GAILS, DO;  Location: [facility] ENDOSCOPY;  Service: Gastroenterology;  Laterality: N/A;  ENTEROSCOPY N/A 09/23/2021   Procedure: ENTEROSCOPY;  Surgeon: San Sandor GAILS, DO;  Location: [facility] ENDOSCOPY;  Service: Gastroenterology;  Laterality: N/A;  Will place order for video capsule study as we may opt to place it during procedure   ESOPHAGOGASTRODUODENOSCOPY N/A 08/01/2019   Procedure: ESOPHAGOGASTRODUODENOSCOPY (EGD);  Surgeon: Kristie Lamprey, MD;  Location: [facility] ENDOSCOPY;  Service: Endoscopy;  Laterality: N/A;   ESOPHAGOGASTRODUODENOSCOPY N/A 11/18/2020   Procedure: ESOPHAGOGASTRODUODENOSCOPY (EGD);  Surgeon: Stacia Glendia BRAVO, MD;  Location: [facility] ENDOSCOPY;  Service: Gastroenterology;  Laterality: N/A;   ESOPHAGOGASTRODUODENOSCOPY (EGD) WITH PROPOFOL  N/A 08/04/2019   Procedure: ESOPHAGOGASTRODUODENOSCOPY  (EGD) WITH PROPOFOL ;  Surgeon: Leigh Elspeth SQUIBB, MD;  Location: [facility] ENDOSCOPY;  Service: Gastroenterology;  Laterality: N/A;   ESOPHAGOGASTRODUODENOSCOPY (EGD) WITH PROPOFOL  N/A 09/13/2021   Procedure: ESOPHAGOGASTRODUODENOSCOPY (EGD) WITH PROPOFOL ;  Surgeon: Wilhelmenia Aloha Raddle., MD;  Location: [facility] ENDOSCOPY;  Service: Gastroenterology;  Laterality: N/A;   GIVENS CAPSULE STUDY N/A 09/23/2021   Procedure: GIVENS CAPSULE STUDY;  Surgeon: San Sandor GAILS, DO;  Location: [facility] ENDOSCOPY;  Service: Gastroenterology;  Laterality: N/A;   graft left arm Left    for dialysis x 2. Removed   HOT HEMOSTASIS  11/18/2020   Procedure: HOT HEMOSTASIS (ARGON PLASMA COAGULATION/BICAP);  Surgeon: Stacia Glendia BRAVO, MD;  Location: [facility] ENDOSCOPY;  Service: Gastroenterology;;   HOT HEMOSTASIS N/A 09/23/2021   Procedure: HOT HEMOSTASIS (ARGON PLASMA COAGULATION/BICAP);  Surgeon: San Sandor GAILS, DO;  Location: [facility] ENDOSCOPY;  Service: Gastroenterology;  Laterality: N/A;   INSERTION OF DIALYSIS CATHETER     Rt chest   LAPAROSCOPIC RIGHT COLECTOMY N/A 08/05/2015   Procedure: LAPAROSCOPIC RIGHT COLECTOMY- ASCENDING;  Surgeon: Jina Nephew, MD;  Location: [facility] OR;  Service: General;  Laterality: N/A;   MASS EXCISION Left 05/28/2019   Procedure: EXCISION SOFT TISSUE MASS LEFT SHOULDER;  Surgeon: Eletha Boas, MD;  Location: [facility] ORS;  Service: General;  Laterality: Left;   NEPHRECTOMY Bilateral    PARATHYROIDECTOMY N/A 06/12/2016   Procedure: TOTAL PARATHYROIDECTOMY WITH AUTOTRANSPLANTATION TO LEFT FOREARM;  Surgeon: Boas Eletha, MD;  Location: [facility] OR;  Service: General;  Laterality: N/A;   PARATHYROIDECTOMY N/A 10/23/2016   Procedure: PARATHYROIDECTOMY;  Surgeon: Eletha Boas, MD;  Location: [facility] OR;  Service: General;  Laterality: N/A;   REVERSE SHOULDER ARTHROPLASTY Right 08/24/2020   Procedure: REVERSE SHOULDER ARTHROPLASTY;  Surgeon: Cristy Bonner DASEN, MD;  Location: [facility] OR;  Service: Orthopedics;  Laterality: Right;    REVISON OF ARTERIOVENOUS FISTULA Right 07/16/2017   Procedure: REVISION OF ARTERIOVENOUS FISTULA  Right ARM;  Surgeon: Sheree Penne Bruckner, MD;  Location: [facility] OR;  Service: Vascular;  Laterality: Right;   TOTAL HIP ARTHROPLASTY Left 11/14/2020   Procedure: LEFT TOTAL HIP ARTHROPLASTY ANTERIOR APPROACH;  Surgeon: [name] Kay HERO, MD;  Location: [facility] OR;  Service: Orthopedics;  Laterality: Left;   UPPER GASTROINTESTINAL ENDOSCOPY  2021   @ [facility]   Patient Active Problem List   Diagnosis Date Noted   Primary osteoarthritis of left knee 10/03/2023   Right hip pain 07/14/2023   Coping style affecting medical condition 06/05/2023   Spinal cord injury, cervical region, sequela 05/30/2023   Cervical vertebral fracture (HCC) 05/25/2023   History of GI bleed 05/25/2023   C4 cervical fracture (HCC) 05/25/2023   Duodenitis    Gastritis and gastroduodenitis    Ischemic colitis    Bright red blood per rectum    ABLA (acute blood loss anemia)    Gastric ulcer    Duodenal ulcer    Diverticulosis of colon  without hemorrhage    GI bleed 09/17/2021   Acute upper GI bleeding 09/11/2021   Mucosal abnormality of esophagus    Mucosal abnormality of stomach    Mucosal abnormality of duodenum    Upper GI bleed    Left hip pain 11/16/2020   Type 2 diabetes mellitus with other specified complication (HCC) 11/14/2020   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/14/2020   Primary osteoarthritis of left hip 10/07/2020   Status post reverse total arthroplasty of right shoulder 08/24/2020   Diverticulitis 03/08/2020   ESRD (end stage renal disease) (HCC) 03/08/2020   Polyneuropathy in diseases classified elsewhere 03/08/2020   Pure hypercholesterolemia 03/08/2020   Sacral back pain 03/08/2020   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 11/10/2019   Acute gastric ulcer with hemorrhage    Acute GI bleeding 07/31/2019   Hypotension of hemodialysis 07/31/2019   Diverticulitis large intestine 07/31/2019   Mass of  soft tissue of shoulder 05/26/2019   Awaiting organ transplant status 03/18/2019   Finding of cocaine in blood 02/25/2019   Anaphylactic shock, unspecified, sequela 11/21/2018   Hypercalcemia 10/28/2018   Diarrhea, unspecified 08/08/2018   Hyperkalemia 03/12/2018   Renal cell carcinoma of left kidney (HCC) 12/24/2017   Fluid overload, unspecified 07/06/2017   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   Other specified disorders of kidney and ureter 11/22/2016   Secondary hyperparathyroidism of renal origin 10/23/2016   Headache disorder 08/21/2016   TIA (transient ischemic attack)    Essential hypertension    Seizure (HCC)    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    Lung nodule    Diverticulitis of cecum 07/21/2015   Diverticulitis of large intestine without perforation or abscess without bleeding    Right lower quadrant pain    Obstructive sleep apnea 02/08/2015   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   Diverticulosis 07/13/2014   Lower GI bleed 07/12/2014   Refusal of blood transfusions as patient is Jehovah's Witness    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   HLD (hyperlipidemia)    PRES (posterior reversible encephalopathy syndrome)    History of CVA (cerebrovascular accident) 05/23/2014   Anemia of chronic disease 05/23/2014   GERD (gastroesophageal reflux disease) 12/09/2012   Gastrointestinal bleeding 11/12/2012   Melena 11/12/2012   Other psychoactive substance dependence, uncomplicated (HCC) 10/08/2012   Iron deficiency anemia, unspecified 06/10/2012   Moderate protein-calorie malnutrition 07/29/2009   Coagulation defect, unspecified 08/29/2006   Type 2 diabetes mellitus with diabetic peripheral angiopathy without gangrene (HCC) 08/29/2006   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 No injuries, did net seek medical attention   PERTINENT HISTORY: See above   ACDF C3-C5 fusion, anterior cervical corpectomy C3-C4  PAIN:  Are you having pain? 0/10 R ankle  PRECAUTIONS:  Precautions:  Precautions Precautions: Fall, Cervical, Other (comment) Recall of Precautions/Restrictions: Impaired Precaution/Restrictions Comments: Verbally reviewed  cervical precautions. Required Braces or Orthoses: Cervical Brace Cervical Brace: Soft collar, At all times Restrictions Weight Bearing Restrictions Per Provider Order: No General: Pain: No c/o pain.   RED FLAGS: None   WEIGHT BEARING RESTRICTIONS: No  FALLS:  Has patient fallen in last 6 months? Yes. Number of falls 3- one that led to surgery, then 2 after getting home from the hospital  LIVING ENVIRONMENT: Lives with: lives with their son Lives in: House/apartment Stairs: ground floor apt  Has following equipment at home: Single point cane, Environmental consultant - 2 wheeled, and Environmental consultant - 4 wheeled  OCCUPATION: retired- used to pressure wash   PLOF: Independent, Independent with basic ADLs, Independent with gait, and Independent with transfers  PATIENT GOALS: get back to normal ability, drive convertible again   NEXT MD VISIT: Dr. Cornelio 11/15/23  OBJECTIVE:  Note: Objective measures were completed at Evaluation unless otherwise noted.  DIAGNOSTIC FINDINGS:    CLINICAL DATA:  63 year old male with altered mental status.    EXAM: CT HEAD WITHOUT CONTRAST   TECHNIQUE: Contiguous axial images were obtained from the base of the skull through the vertex without intravenous contrast.   RADIATION DOSE REDUCTION: This exam was performed according to the departmental dose-optimization program which includes automated exposure control, adjustment of the mA and/or kV according to patient size and/or use of iterative reconstruction technique.   COMPARISON:  Brain MRI and head CT 05/25/2023.   FINDINGS: Brain:   Unusual extensive, diffuse pachymeningeal calcification which has been present since at least 2013. Stable cerebral volume. No midline shift, ventriculomegaly, mass effect, evidence of mass lesion, intracranial hemorrhage or evidence of cortically based acute infarction. Small area of chronic encephalomalacia in the medial left occipital lobe redemonstrated. Stable gray-white matter differentiation throughout the brain.   Vascular: Chronic severe pachymeningeal calcification.   Skull: Appears stable.  No acute osseous abnormality identified.   Sinuses/Orbits: Continued left middle ear and mastoid opacification. Other Visualized paranasal sinuses and mastoids are stable and well aerated.   Other: Anterior left scalp soft tissue injury has regressed since earlier in the month. Orbits soft tissues appears stable.   IMPRESSION: 1. No acute intracranial abnormality. 2. 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 3. Ongoing left middle ear and mastoid opacification. No acute osseous abnormality identified. 4. Regressed scalp soft tissue injury since 05/25/2023.  CLINICAL DATA:  Chronic bilateral knee pain and swelling.   EXAM: RIGHT KNEE - COMPLETE 4+ VIEW; LEFT KNEE - COMPLETE 4+ VIEW   COMPARISON:  Right knee radiographs dated 12/26/2021.   FINDINGS: Right knee:   No acute fracture or dislocation. There is a small to  moderate-sized joint effusion. Moderate tricompartmental degenerative changes with joint space narrowing and marginal osteophytosis. Vascular calcifications are noted.   Left knee:   No acute fracture or dislocation. There is a small joint effusion. Moderate tricompartmental degenerative changes with joint space narrowing and marginal osteophytosis. Vascular calcifications are noted.   IMPRESSION: 1. No acute osseous abnormality. 2. 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  Narrative & Impression  CLINICAL DATA:  Chronic bilateral knee pain and swelling.   EXAM: RIGHT KNEE - COMPLETE 4+ VIEW; LEFT KNEE - COMPLETE 4+ VIEW   COMPARISON:  Right knee radiographs dated 12/26/2021.   FINDINGS: Right knee:   No acute fracture or dislocation. There is a small to moderate-sized joint effusion. Moderate tricompartmental degenerative changes with joint space narrowing and marginal  osteophytosis. Vascular calcifications are noted.   Left knee:   No acute fracture or dislocation. There is a small joint effusion. Moderate tricompartmental degenerative changes with joint space narrowing and marginal osteophytosis. Vascular calcifications are noted.   IMPRESSION: 1. No acute osseous abnormality. 2. 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    Narrative & Impression CLINICAL DATA:  Right hip pain.   EXAM: DG HIP (WITH OR WITHOUT PELVIS) 2-3V RIGHT   COMPARISON:  None Available.   FINDINGS: No acute fracture or dislocation. The bones are osteopenic. Cystic changes of the right femoral head and neck measure up to 3.7 cm. Orthopedic referral is advised. Mild arthritic changes of the right hip. Total left hip arthroplasty. The soft tissues are unremarkable. Vascular calcifications.   IMPRESSION: 1. No acute fracture or  dislocation. 2. Cystic changes of the right femoral head and neck. Orthopedic referral is advised.  Lower Venous DVT Study   Patient Name:  [name].  Date of Exam:   06/10/2023  Medical Rec #: [id number]            Accession #:    [id number]  Date of Birth: March 23, 1960             Patient Gender: M  Patient Age:   53 years  Exam Location[facility]  [facility]  Procedure:      VAS US  LOWER EXTREMITY VENOUS (DVT)  Referring Phys: PRENTICE COMPTON    - PATIENT SURVEYS:  PSFS: THE PATIENT SPECIFIC FUNCTIONAL SCALE  Place score of 0-10 (0 = unable to perform activity and 10 = able to perform activity at the same level as before injury or problem)  Activity Date: eval 09/12/23 11/07/23   Getting in and out of car (anything lower than 20 inches)  0 0   2. Balance  3 5   3. Walking without a device  2 5   4.  Social activities  0 0   Total Score 1.25 2.5     Total Score = Sum of activity scores/number of activities  Minimally Detectable Change: 3 points (for single activity); 2 points (for average score)  [employer] (nd). The Patient Specific Functional Scale . Retrieved from [email or web address]   COGNITION: Overall cognitive status: Within functional limits for tasks assessed     SENSATION: Hx of neuropathy even before fall/surgery     LOWER EXTREMITY MMT:  MMT Right eval Left eval Right 10/24/23 Left 10/24/23 RT/Left 10/31/23 RT/L 11/05/23  Hip flexion 3- 3+ 3 3+ 3/3+ 3+/4  Hip extension        Hip abduction        Hip adduction        Hip internal rotation        Hip external rotation        Knee flexion 3- 4+ 4- 5 4/5 4/5  Knee extension 3- 5 3- 5 3-/5 3-/5  Ankle dorsiflexion 4 5 5 5     Ankle plantarflexion        Ankle inversion        Ankle eversion         (Blank rows = not tested)    GAIT: Distance walked: in clinic distances  Assistive device utilized: Environmental consultant - 2 wheeled Level of  assistance: Modified independence Comments: slow but steady with RW  TREATMENT DATE:  11/07/23 NuStep L 5 x 7 min Sit to stand 2x10 form elevated surface with UE use Leg press 40lb 2x10 6in step ups with 2 rails x10 Heel raises 2x10 LAQ 2lb 2x10  11/05/23 NuStep L5 x 6 min Goals  Sit to stands w/ UE from elevated mat 2x10 6in step ups with UE assit x10 on L, x5 on R  10/31/23 Nustep L 5 STGs assessed including MMT Leg Press 40# 2 sets 10, SL 10 x BIL 20#, AA with RT to start 6 inch step up with UE support 10 x BIL CG- min A 4 inch step down with UE 10 xBIL CG-min A STS no hands CG-min A with cuing 28 in , could not do lower 10 x  10/29/23 Nustep L 5 BIL ankle red tband 4 way 10x 2 sets  Green tband HS curl 2 sets 10 LAQ 3# 2 sets 10 Standing with RW 3# 10 x SL hip flex,ext and abd,marching and HS curl Leg press 40# 2 sets 10. 20# SL 10 x BIL , assist to start on RT Green tband HS curl 2 sets 10     10/24/23 NuStep L 5 x 6 min Goals  Stairs heavy UE use come SGA alt pattern 2 rails Sit to stand from elevated surface with compensation due to weakness LAQ 2lb 2x10  Eccentrics with RLE Gait 50 feet No AD CGA   Decrease heel strike and step length   10/22/23 Nustep L 5, 6 min Standing reaching outside base of support  Side steps at mat able  Leg press 40lb 2x10   SL 20lb x10 each assist need with RLE  Gait 46ft CGA cues for erect posture   Then 42ft LAQ 2lb 2x10  Eccentrics with RLE  10/16/23:  Nustep L 5, 6 min Lat rows(airex in seat) 20# 2 x 10 Rows standing 15# 2 x 10 Knee ext B 5# 2 x 10 Knee flexion 25# 2 x 10, 35# 1 x 10 Sit to stand from elevated mat 7 x Chest presses for, side, side 10 x each blue mediball   10/10/23 Nustep L 5 Green tband HS curls 2 sets 10 3# LAQ 2 sets 10 3# standing with RW 10 x  BIL-marching,hip flex,ext ,abd and HS curl HHA 3# marching fwd and back 15 feet 2 x each HHA 3# side stepping 15 feet 2 x each Leg Press 20# BIL 10 x seat on 9, SL 10 x each 20 #  left leg seat on 11, RT needs assist to start mvmt. 20# BIL 10 x seat 8  10/08/23 Nustep L 5 Green tband HS curl 2 sets 10 2# LAQ 2 sets 10- quad lag BIL RT > left Standing with RW 2# marching,SL hip flex,ext and abd 20 x alt STS elevated mat 27.5  inches 2 sets 5 hands on knee, min A Amb with SPC CGA 100 feet with 2 slight sways but reqained Step up 4 in with RW 10 x BIL Step up 4 in with airex min A on RT 2 sets 5, left 10 x   10/01/23 UBE L 3 2 min fwd and back NuStep L 5 x 6 min STS elevated mat 27.5  inches 2 sets 5 hands on knee, CGA SLS HS curls 10 x 15# RT, left 10x 20#, BIL 25# 10 x Knee ext 5# LLE 2 sets 5 RT knee LAQ 3 sets 5     09/25/23 NuStep L  5 x 6 min LAQ RLE no weight, LLE 3lb cuff 2x10  Seated match 3lb 2x10 HS curls green 2x15 Sit to stand form elevated surface 2x5 with UE use Side steps at mat  Standing march in [facility]  09/12/23  Eval, POC      PATIENT EDUCATION:  Education details: exam findings, POC Person educated: Patient Education method: Explanation Education comprehension: verbalized understanding, returned demonstration, and needs further education  HOME EXERCISE PROGRAM: Educ on getting ankle wts, adjustable up to 5 is rec as we used 3 #- okayed doing seated and standing ex with RW  ASSESSMENT:  CLINICAL IMPRESSION: Pt enters reporting a fall this morning, fell backwards in shower.  He continues to have weakness in both LE's RLE Pt struggles with initial unlocking on knees for eccentric lowering with step ups.  Pt also has difficulty standing from standard height chair. Heavy UE use with step ups more so when stepping up with RLE.  Cues to hold contraction needed with LAQ.   Patient is a 63 y.o. M who was seen today for physical therapy evaluation and  treatment for S14.109S (ICD-10-CM) - Spinal cord injury, cervical region, sequela (HCC) Pt tolerated a initial progression to therapy well. RLE is significantly weaker than R. Pt had log legs and a relatively short torso making it difficult to stand from low surfaces in conjunction with weak LE. Decrease coordination with side steps. No pain during session. Limited ROM with RLE during isolated exercises.  Will make every effort to improve function and work towards goals.   OBJECTIVE IMPAIRMENTS: Abnormal gait, decreased activity tolerance, decreased balance, decreased coordination, decreased knowledge of use of DME, decreased mobility, difficulty walking, decreased strength, postural dysfunction, and pain.   ACTIVITY LIMITATIONS: standing, squatting, stairs, transfers, bed mobility, toileting, dressing, and locomotion level  PARTICIPATION LIMITATIONS: shopping, community activity, and yard work  PERSONAL FACTORS: Age, Behavior pattern, Education, Fitness, Past/current experiences, Profession, Social background, and Time since onset of injury/illness/exacerbation are also affecting patient's functional outcome.   REHAB POTENTIAL: Good  CLINICAL DECISION MAKING: Evolving/moderate complexity  EVALUATION COMPLEXITY: Moderate   GOALS: Goals reviewed with patient? No  SHORT TERM GOALS: Target date: 10/24/2023   Will be compliant with appropriate progressive HEP  Baseline: Goal status: 10/08/23 MET  2.  Will be able to consistently perform functional STS tranfers from surfaces 20 inches or less  Baseline:  Goal status:10/08/23 on going  10/31/23 progressing still difficult for pt, 11/05/23 progressing still difficult for pt uses his UE   3.  Will be able to ambulate at least 361ft with quad cane no more than light MinA  Baseline:  Goal status: 10/08/23 progressing  and 10/31/23  4.  MMT to be at least 3+/5 in all tested groups R LE  Baseline:  Goal status: ongoing 10/24/23  and  10/31/23    LONG TERM GOALS: Target date: 12/05/2023      R LE MMT to be at least 4-/5 in all tested groups  Baseline:  Goal status: ongoing 10/24/23, Ongoing 11/05/23  2.  Will score at least 18 on DGI with no device to show reduced fall risk  Baseline:  Goal status: INITIAL  3.  Will be able to reciprocally ascend/descend steps without difficulty and U rail  Baseline:  Goal status: Ongoing 10/24/23  4.  Will be able to ambulate community distances with LRAD, Mod(I) to allow for improved community access and participating in social activities  Baseline:  Goal status: Progressing 11/05/23  5.  Will be compliant with appropriate gym based exercise program by time of DC  Baseline:  Goal status: reports going to gym some with his son , using bicycle., Progressing 11/05/23 goes with his son    PLAN:  PT FREQUENCY: 2x/week  PT DURATION: 12 weeks  PLANNED INTERVENTIONS: 97750- Physical Performance Testing, 97110-Therapeutic exercises, 97530- Therapeutic activity, 97112- Neuromuscular re-education, 97535- Self Care, 02859- Manual therapy, and 97116- Gait training  PLAN FOR NEXT SESSION: progress func strength, gait and balance. Jon Payseur PTA  11/07/23 8:02 AM

## 2023-11-08 ENCOUNTER — Other Ambulatory Visit: Payer: Self-pay | Admitting: *Deleted

## 2023-11-08 DIAGNOSIS — M79606 Pain in leg, unspecified: Secondary | ICD-10-CM

## 2023-11-09 DIAGNOSIS — D689 Coagulation defect, unspecified: Secondary | ICD-10-CM | POA: Diagnosis not present

## 2023-11-11 ENCOUNTER — Ambulatory Visit: Admitting: Podiatry

## 2023-11-11 ENCOUNTER — Encounter: Payer: Self-pay | Admitting: Podiatry

## 2023-11-11 DIAGNOSIS — B351 Tinea unguium: Secondary | ICD-10-CM

## 2023-11-11 DIAGNOSIS — M79674 Pain in right toe(s): Secondary | ICD-10-CM | POA: Diagnosis not present

## 2023-11-11 DIAGNOSIS — M79675 Pain in left toe(s): Secondary | ICD-10-CM | POA: Diagnosis not present

## 2023-11-11 DIAGNOSIS — N2581 Secondary hyperparathyroidism of renal origin: Secondary | ICD-10-CM | POA: Diagnosis not present

## 2023-11-11 NOTE — Progress Notes (Unsigned)
 Subjective:  Patient ID: Carl Curls Sr., male    DOB: 03-26-1960,  MRN: [id number]  Carl Curls Sr. presents to clinic today for:  Chief Complaint  Patient presents with   [facility]     RFC Non diabetic toenail trim. 0 pain. Lateral ankle pain on right being treated by PCP and given prednisone .   Patient notes nails are thick, discolored, elongated and painful in shoegear when trying to ambulate.  His recent renal function panel numbers were elevated, including potassium.  Last fingerstick glucose obtained on 07/03/2023 was 147 mg/dL.  PCP is Regino, Dibas, MD.  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    Parathyroid  abnormality    ectopic parathyroid  gland   Presence of arteriovenous fistula for hemodialysis, primary    RUE PER PT RLE   Refusal of blood product    NO WHOLE BLOOD PROUCTS   Renal cell carcinoma (HCC)    s/p hand assisted laparoscopic bilateral nephrectomies 11/29/17, + RCC left   Secondary hyperparathyroidism    Seizures (HCC)    one episode in past, due to elevated Potassium 08/02/20- at least 4 years ago   Sleep apnea    doesn't use CPAP anymore since weight loss   Stroke [facility])    no residual    Past Surgical History:  Procedure Laterality Date   ANTERIOR CERVICAL CORPECTOMY N/A 05/25/2023   Procedure: ANTERIOR CERVICAL CORPECTOMY CERVICAL FOUR, CERVICAL THREE - CERVICAL FIVE FUSION;  Surgeon: [name] Shove, MD;  [facility] OR;  Service: Neurosurgery;  Laterality: N/A;   AV FISTULA PLACEMENT Right    right arm   BIOPSY  08/01/2019   Procedure: BIOPSY;  Surgeon: Kristie Lamprey, MD;  Location: [facility] ENDOSCOPY;  Service: Endoscopy;;   BIOPSY  11/18/2020   Procedure: BIOPSY;  Surgeon: Stacia Glendia BRAVO, MD;  Location: [facility] ENDOSCOPY;  Service: Gastroenterology;;   BIOPSY  09/13/2021   Procedure: BIOPSY;  Surgeon: Wilhelmenia Aloha Raddle., MD;  Location: [facility] ENDOSCOPY;  Service: Gastroenterology;;   CATARACT EXTRACTION W/ INTRAOCULAR LENS  IMPLANT, BILATERAL     COLON SURGERY     COLONOSCOPY N/A 08/04/2015   Procedure: COLONOSCOPY;  Surgeon: Gordy CHRISTELLA Starch, MD;  Location: [facility] ENDOSCOPY;  Service: Endoscopy;  Laterality: N/A;   COLONOSCOPY  2017   JMP@ [facility]-good prep-mass -recall 1 yr   COLONOSCOPY N/A 09/13/2021   Procedure: COLONOSCOPY;  Surgeon: Mansouraty, Aloha Raddle., MD;  Location: [facility] ENDOSCOPY;  Service: Gastroenterology;  Laterality: N/A;   COLONOSCOPY WITH PROPOFOL  N/A 11/25/2020   Procedure: COLONOSCOPY WITH PROPOFOL ;  Surgeon: Federico Rosario BROCKS, MD;  Location: [facility] ENDOSCOPY;  Service: Gastroenterology;  Laterality: N/A;   COLONOSCOPY WITH PROPOFOL  N/A 09/23/2021   Procedure: COLONOSCOPY WITH PROPOFOL ;  Surgeon: San Sandor GAILS, DO;  [facility] ENDOSCOPY;  Service: Gastroenterology;  Laterality: N/A;   ENTEROSCOPY N/A 09/23/2021   Procedure: ENTEROSCOPY;  Surgeon: San Sandor GAILS, DO;  [facility] ENDOSCOPY;  Service: Gastroenterology;  Laterality: N/A;  Will place order for video capsule study as we may opt to place it during procedure   ESOPHAGOGASTRODUODENOSCOPY N/A 08/01/2019   Procedure: ESOPHAGOGASTRODUODENOSCOPY (EGD);  Surgeon: Kristie Lamprey, MD;  Location: [facility] ENDOSCOPY;  Service: Endoscopy;  Laterality: N/A;   ESOPHAGOGASTRODUODENOSCOPY N/A 11/18/2020   Procedure: ESOPHAGOGASTRODUODENOSCOPY (EGD);  Surgeon: Stacia Glendia BRAVO, MD;  Location: [facility] ENDOSCOPY;  Service: Gastroenterology;  Laterality: N/A;    ESOPHAGOGASTRODUODENOSCOPY (EGD) WITH PROPOFOL  N/A 08/04/2019   Procedure: ESOPHAGOGASTRODUODENOSCOPY (EGD) WITH PROPOFOL ;  Surgeon: Leigh Elspeth SQUIBB, MD;  Location: [facility] ENDOSCOPY;  Service: Gastroenterology;  Laterality: N/A;   ESOPHAGOGASTRODUODENOSCOPY (EGD) WITH PROPOFOL  N/A 09/13/2021   Procedure: ESOPHAGOGASTRODUODENOSCOPY (EGD) WITH PROPOFOL ;  Surgeon: Wilhelmenia Aloha Raddle., MD;  Location: [facility] ENDOSCOPY;  Service: Gastroenterology;  Laterality: N/A;   GIVENS CAPSULE STUDY N/A 09/23/2021   Procedure: GIVENS CAPSULE STUDY;  Surgeon: San Sandor GAILS, DO;  [facility] ENDOSCOPY;  Service: Gastroenterology;  Laterality: N/A;   graft left arm Left    for dialysis x 2. Removed   HOT HEMOSTASIS  11/18/2020   Procedure: HOT HEMOSTASIS (ARGON PLASMA COAGULATION/BICAP);  Surgeon: Stacia Glendia BRAVO, MD;  Location: [facility] ENDOSCOPY;  Service: Gastroenterology;;   HOT HEMOSTASIS N/A 09/23/2021   Procedure: HOT HEMOSTASIS (ARGON PLASMA COAGULATION/BICAP);  Surgeon: San Sandor GAILS, DO;  Location: [facility] ENDOSCOPY;  Service: Gastroenterology;  Laterality: N/A;   INSERTION OF DIALYSIS CATHETER     Rt chest   LAPAROSCOPIC RIGHT COLECTOMY N/A 08/05/2015   Procedure: LAPAROSCOPIC RIGHT COLECTOMY- ASCENDING;  Surgeon: Jina Nephew, MD;  [facility] OR;  Service: General;  Laterality: N/A;   MASS EXCISION Left 05/28/2019   Procedure: EXCISION SOFT TISSUE MASS LEFT SHOULDER;  Surgeon: Eletha Boas, MD;  Location: [facility] ORS;  Service: General;  Laterality: Left;   NEPHRECTOMY Bilateral    PARATHYROIDECTOMY N/A 06/12/2016   Procedure: TOTAL PARATHYROIDECTOMY WITH AUTOTRANSPLANTATION TO LEFT FOREARM;  Surgeon: Boas Eletha, MD;  Location: [facility] OR;  Service: General;  Laterality: N/A;   PARATHYROIDECTOMY N/A 10/23/2016   Procedure: PARATHYROIDECTOMY;  Surgeon: Eletha Boas, MD;  Location: [facility] OR;  Service: General;  Laterality: N/A;   REVERSE SHOULDER ARTHROPLASTY Right 08/24/2020   Procedure: REVERSE SHOULDER  ARTHROPLASTY;  Surgeon: Cristy Bonner DASEN, MD;  Location: [facility] OR;  Service: Orthopedics;  Laterality: Right;   REVISON OF ARTERIOVENOUS FISTULA Right 07/16/2017   Procedure: REVISION OF ARTERIOVENOUS FISTULA  Right ARM;  Surgeon: Sheree Penne Bruckner, MD;  Location: [facility] OR;  Service: Vascular;  Laterality: Right;   TOTAL HIP ARTHROPLASTY Left 11/14/2020   Procedure: LEFT TOTAL HIP ARTHROPLASTY ANTERIOR APPROACH;  Surgeon: [name] Kay HERO, MD;  [facility] OR;  Service: Orthopedics;  Laterality: Left;   UPPER GASTROINTESTINAL ENDOSCOPY  2021   @ [facility]    Allergies  Allergen Reactions   Infed [Iron Dextran] Other (See Comments)    Decreased BP    Vicodin [Hydrocodone -Acetaminophen ] Other (See Comments)    Decreased BP   Acetaminophen     Hydrocodone     Diclofenac Other (See Comments)    Caused hypotension    Review of Systems: Negative except as noted in the HPI.  Objective:  Carl Kosman Sr. is a pleasant 63 y.o. male in NAD. AAO x 3.  Vascular Examination: Capillary refill time is 3-5 seconds to toes bilateral. Palpable pedal pulses b/l LE.  Digital hair present b/l.  Skin temperature gradient WNL b/l. No varicosities b/l. No cyanosis noted b/l.   Dermatological Examination: Pedal skin with normal turgor, texture and tone b/l. No open wounds. No interdigital macerations b/l. Toenails x10 are 3mm thick, discolored, dystrophic with subungual debris. There is pain with compression of the nail plates.  They are elongated x10  Neurological examination: Light touch and protective sensation intact using Semmes Weinstein monofilament.  Assessment/Plan: 1. Pain due to onychomycosis of toenails of both feet    The mycotic toenails were sharply debrided x10 with sterile nail nippers and a power debriding burr to decrease bulk/thickness and length.    Follow-up in 3 months for at risk footcare Jazarah Capili D. Donnita Farina, DPM, FACFAS [facility]     [address] [place], [place] [address]                Office [phone]  Fax [phone]

## 2023-11-12 ENCOUNTER — Ambulatory Visit: Admitting: Physical Therapy

## 2023-11-12 DIAGNOSIS — Z992 Dependence on renal dialysis: Secondary | ICD-10-CM | POA: Diagnosis not present

## 2023-11-12 DIAGNOSIS — I120 Hypertensive chronic kidney disease with stage 5 chronic kidney disease or end stage renal disease: Secondary | ICD-10-CM | POA: Diagnosis not present

## 2023-11-12 DIAGNOSIS — N186 End stage renal disease: Secondary | ICD-10-CM | POA: Diagnosis not present

## 2023-11-13 ENCOUNTER — Ambulatory Visit (INDEPENDENT_AMBULATORY_CARE_PROVIDER_SITE_OTHER): Admitting: Vascular Surgery

## 2023-11-13 ENCOUNTER — Encounter: Payer: Self-pay | Admitting: Vascular Surgery

## 2023-11-13 ENCOUNTER — Ambulatory Visit (HOSPITAL_COMMUNITY)
Admission: RE | Admit: 2023-11-13 | Discharge: 2023-11-13 | Disposition: A | Discharge status: Home / Self Care | Source: Ambulatory Visit | Attending: Vascular Surgery | Admitting: Vascular Surgery

## 2023-11-13 VITALS — BP 88/61 | HR 87 | Temp 98.2°F | Ht 73.0 in | Wt 192.0 lb

## 2023-11-13 DIAGNOSIS — N186 End stage renal disease: Secondary | ICD-10-CM | POA: Diagnosis not present

## 2023-11-13 DIAGNOSIS — I70421 Atherosclerosis of autologous vein bypass graft(s) of the extremities with rest pain, right leg: Secondary | ICD-10-CM

## 2023-11-13 DIAGNOSIS — M79606 Pain in leg, unspecified: Secondary | ICD-10-CM | POA: Diagnosis not present

## 2023-11-13 LAB — VAS US ABI WITH/WO TBI
Left ABI: 1.2
Right ABI: 1.68

## 2023-11-13 NOTE — Progress Notes (Signed)
 Patient ID: Carl Delfin Sr., male   DOB: 1961-01-03, 63 y.o.   MRN: [id number]  Reason for Consult: Follow-up   Referred by [name], MD  Subjective:     HPI:  Carl Tubbs Sr. is a 63 y.o. male history of end-stage renal disease dialyzing via right upper arm AV fistula.  He does have right upper extremity pain and weakness due to previous shoulder surgery has been considered for repeat shoulder surgery however has had further medical issues.  Now with right lateral ankle pain and discoloration.  Denies any known trauma.  He does walk with the help of a walker.  No previous lower extremity vascular interventions.  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    Parathyroid  abnormality    ectopic parathyroid  gland   Presence of arteriovenous fistula for hemodialysis, primary    RUE PER PT RLE   Refusal of blood product    NO WHOLE BLOOD PROUCTS   Renal cell carcinoma (HCC)    s/p hand assisted laparoscopic bilateral nephrectomies 11/29/17, + RCC left   Secondary hyperparathyroidism    Seizures (HCC)    one episode in past, due to elevated Potassium 08/02/20- at least 4 years ago   Sleep apnea    doesn't use CPAP anymore since weight loss   Stroke (HCC)    no residual   Family History  Problem Relation Age of Onset   Diabetes Father    Stroke Father    Hypertension Father    Uterine cancer Mother    Lupus Sister    Stroke Sister     Hypertension Sister    Anuerysm Brother        brain   Colon cancer Neg Hx    Esophageal cancer Neg Hx    Stomach cancer Neg Hx    Pancreatic cancer Neg Hx    Liver disease Neg Hx    Colon polyps Neg Hx    Rectal cancer Neg Hx    Past Surgical History:  Procedure Laterality Date   ANTERIOR CERVICAL CORPECTOMY N/A 05/25/2023   Procedure: ANTERIOR CERVICAL CORPECTOMY CERVICAL FOUR, CERVICAL THREE - CERVICAL FIVE FUSION;  Surgeon: [name] Shove, MD;  [facility];  Service: Neurosurgery;  Laterality: N/A;   AV FISTULA PLACEMENT Right    right arm   BIOPSY  08/01/2019   Procedure: BIOPSY;  Surgeon: Kristie Lamprey, MD;  Location: [facility] ENDOSCOPY;  Service: Endoscopy;;   BIOPSY  11/18/2020   Procedure: BIOPSY;  Surgeon: Stacia Glendia BRAVO, MD;  Location: [facility] ENDOSCOPY;  Service: Gastroenterology;;   BIOPSY  09/13/2021   Procedure: BIOPSY;  Surgeon: Wilhelmenia Aloha Raddle., MD;  Location: [facility] ENDOSCOPY;  Service: Gastroenterology;;   CATARACT EXTRACTION W/ INTRAOCULAR LENS  IMPLANT, BILATERAL     COLON SURGERY  COLONOSCOPY N/A 08/04/2015   Procedure: COLONOSCOPY;  Surgeon: Gordy CHRISTELLA Starch, MD;  Location: [facility] ENDOSCOPY;  Service: Endoscopy;  Laterality: N/A;   COLONOSCOPY  2017   JMP@ [facility]-good prep-mass -recall 1 yr   COLONOSCOPY N/A 09/13/2021   Procedure: COLONOSCOPY;  Surgeon: Mansouraty, Aloha Raddle., MD;  Location: [facility] ENDOSCOPY;  Service: Gastroenterology;  Laterality: N/A;   COLONOSCOPY WITH PROPOFOL  N/A 11/25/2020   Procedure: COLONOSCOPY WITH PROPOFOL ;  Surgeon: Federico Rosario BROCKS, MD;  Location: [facility] ENDOSCOPY;  Service: Gastroenterology;  Laterality: N/A;   COLONOSCOPY WITH PROPOFOL  N/A 09/23/2021   Procedure: COLONOSCOPY WITH PROPOFOL ;  Surgeon: San Sandor GAILS, DO;  [facility] ENDOSCOPY;  Service: Gastroenterology;  Laterality: N/A;   ENTEROSCOPY N/A 09/23/2021   Procedure: ENTEROSCOPY;  Surgeon: San Sandor GAILS, DO;  Location[facility] ENDOSCOPY;  Service: Gastroenterology;  Laterality: N/A;   Will place order for video capsule study as we may opt to place it during procedure   ESOPHAGOGASTRODUODENOSCOPY N/A 08/01/2019   Procedure: ESOPHAGOGASTRODUODENOSCOPY (EGD);  Surgeon: Kristie Lamprey, MD;  Location: [facility] ENDOSCOPY;  Service: Endoscopy;  Laterality: N/A;   ESOPHAGOGASTRODUODENOSCOPY N/A 11/18/2020   Procedure: ESOPHAGOGASTRODUODENOSCOPY (EGD);  Surgeon: Stacia Glendia BRAVO, MD;  Location: [facility] ENDOSCOPY;  Service: Gastroenterology;  Laterality: N/A;   ESOPHAGOGASTRODUODENOSCOPY (EGD) WITH PROPOFOL  N/A 08/04/2019   Procedure: ESOPHAGOGASTRODUODENOSCOPY (EGD) WITH PROPOFOL ;  Surgeon: Leigh Elspeth SQUIBB, MD;  [facility] ENDOSCOPY;  Service: Gastroenterology;  Laterality: N/A;   ESOPHAGOGASTRODUODENOSCOPY (EGD) WITH PROPOFOL  N/A 09/13/2021   Procedure: ESOPHAGOGASTRODUODENOSCOPY (EGD) WITH PROPOFOL ;  Surgeon: Wilhelmenia Aloha Raddle., MD;  Location: [facility] ENDOSCOPY;  Service: Gastroenterology;  Laterality: N/A;   GIVENS CAPSULE STUDY N/A 09/23/2021   Procedure: GIVENS CAPSULE STUDY;  Surgeon: San Sandor GAILS, DO;  [facility] ENDOSCOPY;  Service: Gastroenterology;  Laterality: N/A;   graft left arm Left    for dialysis x 2. Removed   HOT HEMOSTASIS  11/18/2020   Procedure: HOT HEMOSTASIS (ARGON PLASMA COAGULATION/BICAP);  Surgeon: Stacia Glendia BRAVO, MD;  Location: [facility] ENDOSCOPY;  Service: Gastroenterology;;   HOT HEMOSTASIS N/A 09/23/2021   Procedure: HOT HEMOSTASIS (ARGON PLASMA COAGULATION/BICAP);  Surgeon: San Sandor GAILS, DO;  Location: [facility] ENDOSCOPY;  Service: Gastroenterology;  Laterality: N/A;   INSERTION OF DIALYSIS CATHETER     Rt chest   LAPAROSCOPIC RIGHT COLECTOMY N/A 08/05/2015   Procedure: LAPAROSCOPIC RIGHT COLECTOMY- ASCENDING;  Surgeon: Jina Nephew, MD;  [facility]: [facility] OR;  Service: General;  Laterality: N/A;   MASS EXCISION Left 05/28/2019   Procedure: EXCISION SOFT TISSUE MASS LEFT SHOULDER;  Surgeon: Eletha Boas, MD;  Location: [facility] ORS;  Service: General;  Laterality:  Left;   NEPHRECTOMY Bilateral    PARATHYROIDECTOMY N/A 06/12/2016   Procedure: TOTAL PARATHYROIDECTOMY WITH AUTOTRANSPLANTATION TO LEFT FOREARM;  Surgeon: Boas Eletha, MD;  Location: [facility] OR;  Service: General;  Laterality: N/A;   PARATHYROIDECTOMY N/A 10/23/2016   Procedure: PARATHYROIDECTOMY;  Surgeon: Eletha Boas, MD;  Location: [facility] OR;  Service: General;  Laterality: N/A;   REVERSE SHOULDER ARTHROPLASTY Right 08/24/2020   Procedure: REVERSE SHOULDER ARTHROPLASTY;  Surgeon: Cristy Bonner DASEN, MD;  Location: [facility] OR;  Service: Orthopedics;  Laterality: Right;   REVISON OF ARTERIOVENOUS FISTULA Right 07/16/2017   Procedure: REVISION OF ARTERIOVENOUS FISTULA  Right ARM;  Surgeon: Sheree Penne Bruckner, MD;  Location: [facility] OR;  Service: Vascular;  Laterality: Right;   TOTAL HIP ARTHROPLASTY Left 11/14/2020   Procedure: LEFT TOTAL HIP ARTHROPLASTY ANTERIOR APPROACH;  Surgeon: [name] Kay CHRISTELLA, MD;  [facility] OR;  Service: Orthopedics;  Laterality:  Left;   UPPER GASTROINTESTINAL ENDOSCOPY  2021   @ [facility]    Short Social History:  Social History   Tobacco Use   Smoking status: Former    Current packs/day: 0.00    Types: Cigarettes    Quit date: 06/19/1990    Years since quitting: 33.4   Smokeless tobacco: Never   Tobacco comments:    rarely cigar  Substance Use Topics   Alcohol use: Not Currently    Alcohol/week: 1.0 standard drink of alcohol    Types: 1 Cans of beer per week    Comment: occasional beer    Allergies  Allergen Reactions   Infed [Iron Dextran] Other (See Comments)    Decreased BP    Vicodin [Hydrocodone -Acetaminophen ] Other (See Comments)    Decreased BP   Acetaminophen     Hydrocodone     Diclofenac Other (See Comments)    Caused hypotension    Current Outpatient Medications  Medication Sig Dispense Refill   acetaminophen  (TYLENOL ) 325 MG tablet Take 1-2 tablets (325-650 mg total) by mouth every 4 (four) hours as needed for mild pain (pain score 1-3).     atorvastatin   (LIPITOR) 10 MG tablet Take 1 tablet (10 mg total) by mouth every Thursday. 30 tablet 2   augmented betamethasone  dipropionate (DIPROLENE -AF) 0.05 % ointment Apply 1 Application topically 2 (two) times daily as needed for rash.     calcitRIOL  (ROCALTROL ) 0.5 MCG capsule Take 3 capsules (1.5 mcg total) by mouth every Monday, Wednesday, and Friday with hemodialysis.     camphor-menthol  (SARNA) lotion Apply topically.     cinacalcet  (SENSIPAR ) 60 MG tablet      cromolyn  (OPTICROM ) 4 % ophthalmic solution Place 1 drop into both eyes daily as needed (redness).     cyclobenzaprine  (FLEXERIL ) 10 MG tablet Take 1 tablet (10 mg total) by mouth 3 (three) times daily as needed for muscle spasms. 30 tablet 0   diphenhydrAMINE  (BENADRYL ) 25 mg capsule Take 1 capsule (25 mg total) by mouth every 6 (six) hours as needed for itching.     gabapentin  (NEURONTIN ) 100 MG capsule Take 3 capsules (300 mg total) by mouth at bedtime. 30 capsule 2   ibuprofen (ADVIL) 800 MG tablet SMARTSIG:1 Tablet(s) By Mouth Every 12 Hours PRN     LOKELMA  10 g PACK packet Take 1 packet by mouth daily.     melatonin 5 MG TABS Take 1 tablet (5 mg total) by mouth at bedtime as needed.     Methoxy PEG-Epoetin  Beta (MIRCERA IJ) 75 mcg.     midodrine  (PROAMATINE ) 10 MG tablet Take 1 tablet (10 mg total) by mouth every dialysis (30 min before each dialysis.).     Nystatin (GERHARDT'S BUTT CREAM) CREA Apply 1 Application topically 2 (two) times daily.     oxyCODONE -acetaminophen  (PERCOCET/ROXICET) 5-325 MG tablet Take 1-2 tablets by mouth every 6 (six) hours as needed for severe pain (pain score 7-10) (5mg  for moderate pain and 10 mg for severe pain). 20 tablet 0   pantoprazole  (PROTONIX ) 40 MG tablet Take 1 tablet (40 mg total) by mouth 2 (two) times daily before a meal. (Patient taking differently: Take 40 mg by mouth daily as needed (for acid reflux).) 60 tablet 1   pentafluoroprop-tetrafluoroeth (GEBAUERS) AERO Apply 1 Application topically  as needed (topical anesthesia for hemodialysis).     polyethylene glycol powder (GLYCOLAX /MIRALAX ) 17 GM/SCOOP powder Take 17 g by mouth 2 (two) times daily as needed for mild constipation.     predniSONE  (  DELTASONE ) 10 MG tablet Take by mouth.     psyllium (HYDROCIL/METAMUCIL) 95 % PACK Take 1 packet by mouth 3 (three) times daily.     senna-docusate (SENOKOT-S) 8.6-50 MG tablet Take 2 tablets by mouth 2 (two) times daily. 20 tablet 0   sevelamer  carbonate (RENVELA ) 800 MG tablet Take 3,200-4,000 mg by mouth 3 (three) times daily with meals.     topiramate  (TOPAMAX ) 25 MG tablet Take 1 tablet (25 mg total) by mouth 2 (two) times daily.     No current facility-administered medications for this visit.    Review of Systems  Constitutional:  Constitutional negative. HENT: HENT negative.  Eyes: Eyes negative.  Cardiovascular: Cardiovascular negative.  GI: Gastrointestinal negative.  Musculoskeletal: Positive for leg pain and joint pain.  Neurological: Neurological negative. Hematologic: Hematologic/lymphatic negative.  Psychiatric: Psychiatric negative.        Objective:  Objective   Vitals:   11/13/23 1534  BP: (!) 88/61  Pulse: 87  Temp: 98.2 F (36.8 C)  SpO2: 98%  Weight: 192 lb (87.1 kg)  Height: 6' 1 (1.854 m)   Body mass index is 25.33 kg/m.  Physical Exam HENT:     Head: Normocephalic.     Nose: Nose normal.  Cardiovascular:     Pulses:          Femoral pulses are 2+ on the right side and 2+ on the left side.      Dorsalis pedis pulses are detected w/ Doppler on the right side.       Posterior tibial pulses are detected w/ Doppler on the right side.  Pulmonary:     Effort: Pulmonary effort is normal.  Musculoskeletal:     Comments: Strong thrill right forearm  Neurological:     Mental Status: He is alert.     Data: ABI Findings:  +---------+------------------+-----+----------+--------+  Right   Rt Pressure (mmHg)IndexWaveform  Comment    +---------+------------------+-----+----------+--------+  ATA     120               1.29 triphasic           +---------+------------------+-----+----------+--------+  PTA     156               1.68 monophasic          +---------+------------------+-----+----------+--------+  Great Toe13                0.14                     +---------+------------------+-----+----------+--------+   +---------+------------------+-----+-----------+-------+  Left    Lt Pressure (mmHg)IndexWaveform   Comment  +---------+------------------+-----+-----------+-------+  Brachial 93                                         +---------+------------------+-----+-----------+-------+  ATA     103               1.11 multiphasic         +---------+------------------+-----+-----------+-------+  PTA     112               1.20 biphasic            +---------+------------------+-----+-----------+-------+  Great Toe44                0.47                     +---------+------------------+-----+-----------+-------+   +-------+-----------+-----------+------------+------------+  ABI/TBIToday's ABIToday's TBIPrevious ABIPrevious TBI  +-------+-----------+-----------+------------+------------+  Right 1.68       0.14                                 +-------+-----------+-----------+------------+------------+  Left  1.2        0.47                                     Assessment/Plan:     63 year old male with recent history of right ankle pain and discoloration and severely depressed toe pressures.  Signals in the right foot are monophasic and I cannot reliably feel popliteal pulses.  As such we have discussed either continued watchful waiting versus angiography from a left common femoral approach.  We discussed that he is at some risk for major amputation given his longstanding requirement of dialysis with discoloration of the lateral ankle.  He demonstrates good  understanding we will plan for angiography from a left common femoral approach with possible intervention.  All questions were answered he demonstrates good understanding.  Carl Curls Sr. 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 The patient is on best medical therapy for peripheral arterial disease. The patient has been counseled about the risks of tobacco use in atherosclerotic disease. The patient has been counseled to abstain from any tobacco use. An aortogram with bilateral lower extremity runoff angiography and Right lower extremity intervention and is indicated to better evaluate the patient's lower extremity circulation because of the limb threatening nature of the patient's diagnosis. Based on the patient's clinical exam and non-invasive data, we anticipate an endovascular intervention in the femoropopliteal and tibial vessels.      Penne Lonni Colorado MD [facility]

## 2023-11-13 NOTE — H&P (View-Only) (Signed)
 Patient ID: Carl Delfin Sr., male   DOB: 1961-01-03, 63 y.o.   MRN: [id number]  Reason for Consult: Follow-up   Referred by [name], MD  Subjective:     HPI:  Carl Tubbs Sr. is a 63 y.o. male history of end-stage renal disease dialyzing via right upper arm AV fistula.  He does have right upper extremity pain and weakness due to previous shoulder surgery has been considered for repeat shoulder surgery however has had further medical issues.  Now with right lateral ankle pain and discoloration.  Denies any known trauma.  He does walk with the help of a walker.  No previous lower extremity vascular interventions.  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    Parathyroid  abnormality    ectopic parathyroid  gland   Presence of arteriovenous fistula for hemodialysis, primary    RUE PER PT RLE   Refusal of blood product    NO WHOLE BLOOD PROUCTS   Renal cell carcinoma (HCC)    s/p hand assisted laparoscopic bilateral nephrectomies 11/29/17, + RCC left   Secondary hyperparathyroidism    Seizures (HCC)    one episode in past, due to elevated Potassium 08/02/20- at least 4 years ago   Sleep apnea    doesn't use CPAP anymore since weight loss   Stroke (HCC)    no residual   Family History  Problem Relation Age of Onset   Diabetes Father    Stroke Father    Hypertension Father    Uterine cancer Mother    Lupus Sister    Stroke Sister     Hypertension Sister    Anuerysm Brother        brain   Colon cancer Neg Hx    Esophageal cancer Neg Hx    Stomach cancer Neg Hx    Pancreatic cancer Neg Hx    Liver disease Neg Hx    Colon polyps Neg Hx    Rectal cancer Neg Hx    Past Surgical History:  Procedure Laterality Date   ANTERIOR CERVICAL CORPECTOMY N/A 05/25/2023   Procedure: ANTERIOR CERVICAL CORPECTOMY CERVICAL FOUR, CERVICAL THREE - CERVICAL FIVE FUSION;  Surgeon: [name] Shove, MD;  Location: [facility] OR;  Service: Neurosurgery;  Laterality: N/A;   AV FISTULA PLACEMENT Right    right arm   BIOPSY  08/01/2019   Procedure: BIOPSY;  Surgeon: Kristie Lamprey, MD;  Location: [facility] ENDOSCOPY;  Service: Endoscopy;;   BIOPSY  11/18/2020   Procedure: BIOPSY;  Surgeon: Stacia Glendia BRAVO, MD;  Location: [facility] ENDOSCOPY;  Service: Gastroenterology;;   BIOPSY  09/13/2021   Procedure: BIOPSY;  Surgeon: Wilhelmenia Aloha Raddle., MD;  Location: [facility] ENDOSCOPY;  Service: Gastroenterology;;   CATARACT EXTRACTION W/ INTRAOCULAR LENS  IMPLANT, BILATERAL     COLON SURGERY  COLONOSCOPY N/A 08/04/2015   Procedure: COLONOSCOPY;  Surgeon: Gordy CHRISTELLA Starch, MD;  Location: [facility] ENDOSCOPY;  Service: Endoscopy;  Laterality: N/A;   COLONOSCOPY  2017   JMP@ [facility]-good prep-mass -recall 1 yr   COLONOSCOPY N/A 09/13/2021   Procedure: COLONOSCOPY;  Surgeon: Mansouraty, Aloha Raddle., MD;  Location: [facility] ENDOSCOPY;  Service: Gastroenterology;  Laterality: N/A;   COLONOSCOPY WITH PROPOFOL  N/A 11/25/2020   Procedure: COLONOSCOPY WITH PROPOFOL ;  Surgeon: Federico Rosario BROCKS, MD;  Location: [facility] ENDOSCOPY;  Service: Gastroenterology;  Laterality: N/A;   COLONOSCOPY WITH PROPOFOL  N/A 09/23/2021   Procedure: COLONOSCOPY WITH PROPOFOL ;  Surgeon: San Sandor GAILS, DO;  Location: [facility] ENDOSCOPY;  Service: Gastroenterology;  Laterality: N/A;   ENTEROSCOPY N/A 09/23/2021   Procedure: ENTEROSCOPY;  Surgeon: San Sandor GAILS, DO;  Location: [facility] ENDOSCOPY;  Service: Gastroenterology;  Laterality: N/A;   Will place order for video capsule study as we may opt to place it during procedure   ESOPHAGOGASTRODUODENOSCOPY N/A 08/01/2019   Procedure: ESOPHAGOGASTRODUODENOSCOPY (EGD);  Surgeon: Kristie Lamprey, MD;  Location: [facility] ENDOSCOPY;  Service: Endoscopy;  Laterality: N/A;   ESOPHAGOGASTRODUODENOSCOPY N/A 11/18/2020   Procedure: ESOPHAGOGASTRODUODENOSCOPY (EGD);  Surgeon: Stacia Glendia BRAVO, MD;  Location: [facility] ENDOSCOPY;  Service: Gastroenterology;  Laterality: N/A;   ESOPHAGOGASTRODUODENOSCOPY (EGD) WITH PROPOFOL  N/A 08/04/2019   Procedure: ESOPHAGOGASTRODUODENOSCOPY (EGD) WITH PROPOFOL ;  Surgeon: Leigh Elspeth SQUIBB, MD;  Location: [facility] ENDOSCOPY;  Service: Gastroenterology;  Laterality: N/A;   ESOPHAGOGASTRODUODENOSCOPY (EGD) WITH PROPOFOL  N/A 09/13/2021   Procedure: ESOPHAGOGASTRODUODENOSCOPY (EGD) WITH PROPOFOL ;  Surgeon: Wilhelmenia Aloha Raddle., MD;  Location: [facility] ENDOSCOPY;  Service: Gastroenterology;  Laterality: N/A;   GIVENS CAPSULE STUDY N/A 09/23/2021   Procedure: GIVENS CAPSULE STUDY;  Surgeon: San Sandor GAILS, DO;  Location: [facility] ENDOSCOPY;  Service: Gastroenterology;  Laterality: N/A;   graft left arm Left    for dialysis x 2. Removed   HOT HEMOSTASIS  11/18/2020   Procedure: HOT HEMOSTASIS (ARGON PLASMA COAGULATION/BICAP);  Surgeon: Stacia Glendia BRAVO, MD;  Location: [facility] ENDOSCOPY;  Service: Gastroenterology;;   HOT HEMOSTASIS N/A 09/23/2021   Procedure: HOT HEMOSTASIS (ARGON PLASMA COAGULATION/BICAP);  Surgeon: San Sandor GAILS, DO;  Location: [facility] ENDOSCOPY;  Service: Gastroenterology;  Laterality: N/A;   INSERTION OF DIALYSIS CATHETER     Rt chest   LAPAROSCOPIC RIGHT COLECTOMY N/A 08/05/2015   Procedure: LAPAROSCOPIC RIGHT COLECTOMY- ASCENDING;  Surgeon: Jina Nephew, MD;  Location: [facility] OR;  Service: General;  Laterality: N/A;   MASS EXCISION Left 05/28/2019   Procedure: EXCISION SOFT TISSUE MASS LEFT SHOULDER;  Surgeon: Eletha Boas, MD;  Location: [facility] ORS;  Service: General;  Laterality:  Left;   NEPHRECTOMY Bilateral    PARATHYROIDECTOMY N/A 06/12/2016   Procedure: TOTAL PARATHYROIDECTOMY WITH AUTOTRANSPLANTATION TO LEFT FOREARM;  Surgeon: Boas Eletha, MD;  Location: [facility] OR;  Service: General;  Laterality: N/A;   PARATHYROIDECTOMY N/A 10/23/2016   Procedure: PARATHYROIDECTOMY;  Surgeon: Eletha Boas, MD;  Location: [facility] OR;  Service: General;  Laterality: N/A;   REVERSE SHOULDER ARTHROPLASTY Right 08/24/2020   Procedure: REVERSE SHOULDER ARTHROPLASTY;  Surgeon: Cristy Bonner DASEN, MD;  Location: [facility] OR;  Service: Orthopedics;  Laterality: Right;   REVISON OF ARTERIOVENOUS FISTULA Right 07/16/2017   Procedure: REVISION OF ARTERIOVENOUS FISTULA  Right ARM;  Surgeon: Sheree Penne Bruckner, MD;  Location: [facility] OR;  Service: Vascular;  Laterality: Right;   TOTAL HIP ARTHROPLASTY Left 11/14/2020   Procedure: LEFT TOTAL HIP ARTHROPLASTY ANTERIOR APPROACH;  Surgeon: [name] Kay CHRISTELLA, MD;  Location: [facility] OR;  Service: Orthopedics;  Laterality:  Left;   UPPER GASTROINTESTINAL ENDOSCOPY  2021   @ [facility]    Short Social History:  Social History   Tobacco Use   Smoking status: Former    Current packs/day: 0.00    Types: Cigarettes    Quit date: 06/19/1990    Years since quitting: 33.4   Smokeless tobacco: Never   Tobacco comments:    rarely cigar  Substance Use Topics   Alcohol use: Not Currently    Alcohol/week: 1.0 standard drink of alcohol    Types: 1 Cans of beer per week    Comment: occasional beer    Allergies  Allergen Reactions   Infed [Iron Dextran] Other (See Comments)    Decreased BP    Vicodin [Hydrocodone -Acetaminophen ] Other (See Comments)    Decreased BP   Acetaminophen     Hydrocodone     Diclofenac Other (See Comments)    Caused hypotension    Current Outpatient Medications  Medication Sig Dispense Refill   acetaminophen  (TYLENOL ) 325 MG tablet Take 1-2 tablets (325-650 mg total) by mouth every 4 (four) hours as needed for mild pain (pain score 1-3).     atorvastatin   (LIPITOR) 10 MG tablet Take 1 tablet (10 mg total) by mouth every Thursday. 30 tablet 2   augmented betamethasone  dipropionate (DIPROLENE -AF) 0.05 % ointment Apply 1 Application topically 2 (two) times daily as needed for rash.     calcitRIOL  (ROCALTROL ) 0.5 MCG capsule Take 3 capsules (1.5 mcg total) by mouth every Monday, Wednesday, and Friday with hemodialysis.     camphor-menthol  (SARNA) lotion Apply topically.     cinacalcet  (SENSIPAR ) 60 MG tablet      cromolyn  (OPTICROM ) 4 % ophthalmic solution Place 1 drop into both eyes daily as needed (redness).     cyclobenzaprine  (FLEXERIL ) 10 MG tablet Take 1 tablet (10 mg total) by mouth 3 (three) times daily as needed for muscle spasms. 30 tablet 0   diphenhydrAMINE  (BENADRYL ) 25 mg capsule Take 1 capsule (25 mg total) by mouth every 6 (six) hours as needed for itching.     gabapentin  (NEURONTIN ) 100 MG capsule Take 3 capsules (300 mg total) by mouth at bedtime. 30 capsule 2   ibuprofen (ADVIL) 800 MG tablet SMARTSIG:1 Tablet(s) By Mouth Every 12 Hours PRN     LOKELMA  10 g PACK packet Take 1 packet by mouth daily.     melatonin 5 MG TABS Take 1 tablet (5 mg total) by mouth at bedtime as needed.     Methoxy PEG-Epoetin  Beta (MIRCERA IJ) 75 mcg.     midodrine  (PROAMATINE ) 10 MG tablet Take 1 tablet (10 mg total) by mouth every dialysis (30 min before each dialysis.).     Nystatin (GERHARDT'S BUTT CREAM) CREA Apply 1 Application topically 2 (two) times daily.     oxyCODONE -acetaminophen  (PERCOCET/ROXICET) 5-325 MG tablet Take 1-2 tablets by mouth every 6 (six) hours as needed for severe pain (pain score 7-10) (5mg  for moderate pain and 10 mg for severe pain). 20 tablet 0   pantoprazole  (PROTONIX ) 40 MG tablet Take 1 tablet (40 mg total) by mouth 2 (two) times daily before a meal. (Patient taking differently: Take 40 mg by mouth daily as needed (for acid reflux).) 60 tablet 1   pentafluoroprop-tetrafluoroeth (GEBAUERS) AERO Apply 1 Application topically  as needed (topical anesthesia for hemodialysis).     polyethylene glycol powder (GLYCOLAX /MIRALAX ) 17 GM/SCOOP powder Take 17 g by mouth 2 (two) times daily as needed for mild constipation.     predniSONE  (  DELTASONE ) 10 MG tablet Take by mouth.     psyllium (HYDROCIL/METAMUCIL) 95 % PACK Take 1 packet by mouth 3 (three) times daily.     senna-docusate (SENOKOT-S) 8.6-50 MG tablet Take 2 tablets by mouth 2 (two) times daily. 20 tablet 0   sevelamer  carbonate (RENVELA ) 800 MG tablet Take 3,200-4,000 mg by mouth 3 (three) times daily with meals.     topiramate  (TOPAMAX ) 25 MG tablet Take 1 tablet (25 mg total) by mouth 2 (two) times daily.     No current facility-administered medications for this visit.    Review of Systems  Constitutional:  Constitutional negative. HENT: HENT negative.  Eyes: Eyes negative.  Cardiovascular: Cardiovascular negative.  GI: Gastrointestinal negative.  Musculoskeletal: Positive for leg pain and joint pain.  Neurological: Neurological negative. Hematologic: Hematologic/lymphatic negative.  Psychiatric: Psychiatric negative.        Objective:  Objective   Vitals:   11/13/23 1534  BP: (!) 88/61  Pulse: 87  Temp: 98.2 F (36.8 C)  SpO2: 98%  Weight: 192 lb (87.1 kg)  Height: 6' 1 (1.854 m)   Body mass index is 25.33 kg/m.  Physical Exam HENT:     Head: Normocephalic.     Nose: Nose normal.  Cardiovascular:     Pulses:          Femoral pulses are 2+ on the right side and 2+ on the left side.      Dorsalis pedis pulses are detected w/ Doppler on the right side.       Posterior tibial pulses are detected w/ Doppler on the right side.  Pulmonary:     Effort: Pulmonary effort is normal.  Musculoskeletal:     Comments: Strong thrill right forearm  Neurological:     Mental Status: He is alert.     Data: ABI Findings:  +---------+------------------+-----+----------+--------+  Right   Rt Pressure (mmHg)IndexWaveform  Comment    +---------+------------------+-----+----------+--------+  ATA     120               1.29 triphasic           +---------+------------------+-----+----------+--------+  PTA     156               1.68 monophasic          +---------+------------------+-----+----------+--------+  Great Toe13                0.14                     +---------+------------------+-----+----------+--------+   +---------+------------------+-----+-----------+-------+  Left    Lt Pressure (mmHg)IndexWaveform   Comment  +---------+------------------+-----+-----------+-------+  Brachial 93                                         +---------+------------------+-----+-----------+-------+  ATA     103               1.11 multiphasic         +---------+------------------+-----+-----------+-------+  PTA     112               1.20 biphasic            +---------+------------------+-----+-----------+-------+  Great Toe44                0.47                     +---------+------------------+-----+-----------+-------+   +-------+-----------+-----------+------------+------------+  ABI/TBIToday's ABIToday's TBIPrevious ABIPrevious TBI  +-------+-----------+-----------+------------+------------+  Right 1.68       0.14                                 +-------+-----------+-----------+------------+------------+  Left  1.2        0.47                                     Assessment/Plan:     63 year old male with recent history of right ankle pain and discoloration and severely depressed toe pressures.  Signals in the right foot are monophasic and I cannot reliably feel popliteal pulses.  As such we have discussed either continued watchful waiting versus angiography from a left common femoral approach.  We discussed that he is at some risk for major amputation given his longstanding requirement of dialysis with discoloration of the lateral ankle.  He demonstrates good  understanding we will plan for angiography from a left common femoral approach with possible intervention.  All questions were answered he demonstrates good understanding.  Carl Curls Sr. 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 The patient is on best medical therapy for peripheral arterial disease. The patient has been counseled about the risks of tobacco use in atherosclerotic disease. The patient has been counseled to abstain from any tobacco use. An aortogram with bilateral lower extremity runoff angiography and Right lower extremity intervention and is indicated to better evaluate the patient's lower extremity circulation because of the limb threatening nature of the patient's diagnosis. Based on the patient's clinical exam and non-invasive data, we anticipate an endovascular intervention in the femoropopliteal and tibial vessels.      Penne Lonni Colorado MD [facility]

## 2023-11-14 ENCOUNTER — Ambulatory Visit: Admitting: Physical Therapy

## 2023-11-15 ENCOUNTER — Encounter: Payer: Self-pay | Admitting: Physical Medicine and Rehabilitation

## 2023-11-15 ENCOUNTER — Telehealth: Payer: Self-pay

## 2023-11-15 ENCOUNTER — Encounter: Attending: Registered Nurse | Admitting: Physical Medicine and Rehabilitation

## 2023-11-15 ENCOUNTER — Ambulatory Visit: Admitting: Physical Therapy

## 2023-11-15 VITALS — BP 113/66 | HR 79 | Ht 73.0 in

## 2023-11-15 DIAGNOSIS — M1711 Unilateral primary osteoarthritis, right knee: Secondary | ICD-10-CM | POA: Diagnosis not present

## 2023-11-15 DIAGNOSIS — S14109S Unspecified injury at unspecified level of cervical spinal cord, sequela: Secondary | ICD-10-CM | POA: Diagnosis not present

## 2023-11-15 DIAGNOSIS — M25361 Other instability, right knee: Secondary | ICD-10-CM | POA: Diagnosis not present

## 2023-11-15 DIAGNOSIS — S12300G Unspecified displaced fracture of fourth cervical vertebra, subsequent encounter for fracture with delayed healing: Secondary | ICD-10-CM | POA: Insufficient documentation

## 2023-11-15 DIAGNOSIS — R269 Unspecified abnormalities of gait and mobility: Secondary | ICD-10-CM

## 2023-11-15 MED ORDER — MIDODRINE HCL 10 MG PO TABS: 10.0000 mg | ORAL_TABLET | ORAL | 5 refills | Status: DC | PRN

## 2023-11-15 NOTE — Progress Notes (Signed)
 Subjective:    Patient ID: Carl Dowding Sr., male    DOB: [birth year], 63 y.o.   MRN: [id number]  HPI Pt is a 63 yr old with hx of traumatic C4 incomplete tetraplegia; ESRD on HD M/W/F, Anemia of chronic disease;  anuria; Recurrent GIB= Jehovah's witness, Orthostatic hypotension; Neurogenic bowel with constipation; prior SIRS;  R>L knee pain; And severe R hip pain due to end stage OA; Also have no kidneys at all- looking at Kidney transplant.   Here for hospital f/u after d/c 07/12/23 from [facility].    Drove self today- it's low to ground- drove old convertible- [employer] today- hard to get out of!  Circulation is bad on RLE- might do procedure to  to improve circulation-  so doesn't need amputation  Did U/S and dx'd vascular insufficiency- is outpt procedure.   R hip- hasn't done surgery yet- waiting for Dr [name] to give the green light.  Still sore, but it's manageable!  Taking tylenol  every now and then- and ibuprofen-  last time was 2 weeks ago- takes a couple times/month.  Mostly tylenol  arthritis pain.  ~1x/day usually.    Bowels- up and down- will have solid stools and sometimes diarrhea- - able to get to bathroom and  Had to shower the other day-   Clemens out of the shower the other day- stuck in the curtain! Was covered in soap- and occurred b/c put head all the way back. Didn't hurt except pride! No grab bar!!!!  Is going to sell his truck and get SUV- that's his plan.   Hasn't taken Topamax  in awhile-  Hasn't been having HA's- so stopped it a little while after left rehab.   Still taking Gabapentin  300 mg at bedtime- gets  Margarete Chu- PCP-  Needs Midodrine -  BP been in the dump in the last few weeks   Needs to speak with transplant team- they frown on taking midodrine - of note.  So takes prn.   Rates pain ~ 5/10- but working with tylenol  Not having any spasms.   Doesn't form any urine- doesn't have kidneys at all.   L knee buckling- still - needs to get Ortho  to do R hip and L knee to be done  Uses RW mostly, but occ a cane  Still seeing PT [facility]- - 2x/week- Mostly- leg work- sit-stand, leg presses, strengthening- L is stronger but buckles due to L knee pain/fluid.   Had to get fluid out of L knee since d/c. 2 months ago ,     Pain Inventory Average Pain 5 Pain Right Now 5 My pain is sore  In the last 24 hours, has pain interfered with the following? General activity 0 Relation with others 0 Enjoyment of life 3 What TIME of day is your pain at its worst? varies Sleep (in general) Fair  Pain is worse with: walking Pain improves with: medication Relief from Meds: 5  Family History  Problem Relation Age of Onset   Diabetes Father    Stroke Father    Hypertension Father    Uterine cancer Mother    Lupus Sister    Stroke Sister    Hypertension Sister    Anuerysm Brother        brain   Colon cancer Neg Hx    Esophageal cancer Neg Hx    Stomach cancer Neg Hx    Pancreatic cancer Neg Hx    Liver disease Neg Hx    Colon polyps Neg  Hx    Rectal cancer Neg Hx    Social History   Socioeconomic History   Marital status: Single    Spouse name: Not on file   Number of children: 2   Years of education: Not on file   Highest education level: Not on file  Occupational History   Occupation: retired    Associate Professor: SELF EMPLOYED  Tobacco Use   Smoking status: Former    Current packs/day: 0.00    Types: Cigarettes    Quit date: 06/19/1990    Years since quitting: 33.4   Smokeless tobacco: Never   Tobacco comments:    rarely cigar  Vaping Use   Vaping status: Never Used  Substance and Sexual Activity   Alcohol use: Not Currently    Alcohol/week: 1.0 standard drink of alcohol    Types: 1 Cans of beer per week    Comment: occasional beer   Drug use: Not Currently    Types: Marijuana    Comment: Last use several yrs ago   Sexual activity: Not Currently  Other Topics Concern   Not on file  Social History Narrative    Lives alone at home with brother   12 years of education    2 children    Not married    Are you right handed or left handed? Rtight   Are you currently employed ? retired   What is your current occupation?   Do you live at home alone?   Who lives with you?    What type of home do you live in: 1 story or 2 story? One   Caffeine  1 cup a day       Social Drivers of Corporate investment banker Strain: Not on file  Food Insecurity: No Food Insecurity (06/19/2023)   Hunger Vital Sign    Worried About Running Out of Food in the Last Year: Never true    Ran Out of Food in the Last Year: Never true  Transportation Needs: No Transportation Needs (06/19/2023)   PRAPARE - Administrator, Civil Service (Medical): No    Lack of Transportation (Non-Medical): No  Physical Activity: Not on file  Stress: Not on file  Social Connections: Unknown (06/26/2021)   Received from [facility]   Social Network    Social Network: Not on file   Past Surgical History:  Procedure Laterality Date   ANTERIOR CERVICAL CORPECTOMY N/A 05/25/2023   Procedure: ANTERIOR CERVICAL CORPECTOMY CERVICAL FOUR, CERVICAL THREE - CERVICAL FIVE FUSION;  Surgeon: [name] Shove, MD;  [facility];  Service: Neurosurgery;  Laterality: N/A;   AV FISTULA PLACEMENT Right    right arm   BIOPSY  08/01/2019   Procedure: BIOPSY;  Surgeon: Kristie Lamprey, MD;  Location: [facility] ENDOSCOPY;  Service: Endoscopy;;   BIOPSY  11/18/2020   Procedure: BIOPSY;  Surgeon: Stacia Glendia BRAVO, MD;  Location: [facility] ENDOSCOPY;  Service: Gastroenterology;;   BIOPSY  09/13/2021   Procedure: BIOPSY;  Surgeon: Wilhelmenia Aloha Raddle., MD;  Location: [facility] ENDOSCOPY;  Service: Gastroenterology;;   CATARACT EXTRACTION W/ INTRAOCULAR LENS  IMPLANT, BILATERAL     COLON SURGERY     COLONOSCOPY N/A 08/04/2015   Procedure: COLONOSCOPY;  Surgeon: Gordy CHRISTELLA Starch, MD;  Location: [facility] ENDOSCOPY;  Service: Endoscopy;  Laterality: N/A;   COLONOSCOPY  2017   JMP@  [facility]-good prep-mass -recall 1 yr   COLONOSCOPY N/A 09/13/2021   Procedure: COLONOSCOPY;  Surgeon: Mansouraty, Aloha Raddle., MD;  Location: [facility]  ENDOSCOPY;  Service: Gastroenterology;  Laterality: N/A;   COLONOSCOPY WITH PROPOFOL  N/A 11/25/2020   Procedure: COLONOSCOPY WITH PROPOFOL ;  Surgeon: Federico Rosario BROCKS, MD;  Location: [facility] ENDOSCOPY;  Service: Gastroenterology;  Laterality: N/A;   COLONOSCOPY WITH PROPOFOL  N/A 09/23/2021   Procedure: COLONOSCOPY WITH PROPOFOL ;  Surgeon: San Sandor GAILS, DO;  [facility] ENDOSCOPY;  Service: Gastroenterology;  Laterality: N/A;   ENTEROSCOPY N/A 09/23/2021   Procedure: ENTEROSCOPY;  Surgeon: San Sandor GAILS, DO;  [facility] ENDOSCOPY;  Service: Gastroenterology;  Laterality: N/A;  Will place order for video capsule study as we may opt to place it during procedure   ESOPHAGOGASTRODUODENOSCOPY N/A 08/01/2019   Procedure: ESOPHAGOGASTRODUODENOSCOPY (EGD);  Surgeon: Kristie Lamprey, MD;  Location: [facility] ENDOSCOPY;  Service: Endoscopy;  Laterality: N/A;   ESOPHAGOGASTRODUODENOSCOPY N/A 11/18/2020   Procedure: ESOPHAGOGASTRODUODENOSCOPY (EGD);  Surgeon: Stacia Glendia BRAVO, MD;  Location: [facility] ENDOSCOPY;  Service: Gastroenterology;  Laterality: N/A;   ESOPHAGOGASTRODUODENOSCOPY (EGD) WITH PROPOFOL  N/A 08/04/2019   Procedure: ESOPHAGOGASTRODUODENOSCOPY (EGD) WITH PROPOFOL ;  Surgeon: Leigh Elspeth SQUIBB, MD;  [facility] ENDOS[facility];  Service: Gastroenterology;  Laterality: N/A;   ESOPHAGOGASTRODUODENOSCOPY (EGD) WITH PROPOFOL  N/A 09/13/2021   Procedure: ESOPHAGOGASTRODUODENOSCOPY (EGD) WITH PROPOFOL ;  Surgeon: Wilhelmenia Aloha Raddle., MD;  Location: [facility] ENDOSCOPY;  Service: Gastroenterology;  Laterality: N/A;   GIVENS CAPSULE STUDY N/A 09/23/2021   Procedure: GIVENS CAPSULE STUDY;  Surgeon: San Sandor GAILS, DO;  [facility] ENDOS[facility];  Service: Gastroenterology;  Laterality: N/A;   graft left arm Left    for dialysis x 2. Removed   HOT HEMOSTASIS  11/18/2020   Procedure: HOT  HEMOSTASIS (ARGON PLASMA COAGULATION/BICAP);  Surgeon: Stacia Glendia BRAVO, MD;  Location: [facility] ENDOSCOPY;  Service: Gastroenterology;;   HOT HEMOSTASIS N/A 09/23/2021   Procedure: HOT HEMOSTASIS (ARGON PLASMA COAGULATION/BICAP);  Surgeon: San Sandor GAILS, DO;  Location: [facility] ENDOSCOPY;  Service: Gastroenterology;  Laterality: N/A;   INSERTION OF DIALYSIS CATHETER     Rt chest   LAPAROSCOPIC RIGHT COLECTOMY N/A 08/05/2015   Procedure: LAPAROSCOPIC RIGHT COLECTOMY- ASCENDING;  Surgeon: Jina Nephew, MD;  [facility] OR;  Service: General;  Laterality: N/A;   MASS EXCISION Left 05/28/2019   Procedure: EXCISION SOFT TISSUE MASS LEFT SHOULDER;  Surgeon: Eletha Boas, MD;  Location: [facility] ORS;  Service: General;  Laterality: Left;   NEPHRECTOMY Bilateral    PARATHYROIDECTOMY N/A 06/12/2016   Procedure: TOTAL PARATHYROIDECTOMY WITH AUTOTRANSPLANTATION TO LEFT FOREARM;  Surgeon: Boas Eletha, MD;  Location: [facility] OR;  Service: General;  Laterality: N/A;   PARATHYROIDECTOMY N/A 10/23/2016   Procedure: PARATHYROIDECTOMY;  Surgeon: Eletha Boas, MD;  Location: [facility] OR;  Service: General;  Laterality: N/A;   REVERSE SHOULDER ARTHROPLASTY Right 08/24/2020   Procedure: REVERSE SHOULDER ARTHROPLASTY;  Surgeon: Cristy Bonner DASEN, MD;  Location: [facility] OR;  Service: Orthopedics;  Laterality: Right;   REVISON OF ARTERIOVENOUS FISTULA Right 07/16/2017   Procedure: REVISION OF ARTERIOVENOUS FISTULA  Right ARM;  Surgeon: Sheree Penne Bruckner, MD;  Location: [facility] OR;  Service: Vascular;  Laterality: Right;   TOTAL HIP ARTHROPLASTY Left 11/14/2020   Procedure: LEFT TOTAL HIP ARTHROPLASTY ANTERIOR APPROACH;  Surgeon: [name] Kay HERO, MD;  [facility] OR;  Service: Orthopedics;  Laterality: Left;   UPPER GASTROINTESTINAL ENDOSCOPY  2021   @ [facility]   Past Surgical History:  Procedure Laterality Date   ANTERIOR CERVICAL CORPECTOMY N/A 05/25/2023   Procedure: ANTERIOR CERVICAL CORPECTOMY CERVICAL FOUR, CERVICAL THREE - CERVICAL FIVE  FUSION;  Surgeon: [name] Shove, MD;  [facility];  Service: Neurosurgery;  Laterality: N/A;   AV FISTULA PLACEMENT Right    right arm   BIOPSY  08/01/2019   Procedure: BIOPSY;  Surgeon: Kristie Lamprey, MD;  Location: [facility] ENDOSCOPY;  Service: Endoscopy;;   BIOPSY  11/18/2020   Procedure: BIOPSY;  Surgeon: Stacia Glendia BRAVO, MD;  Location: [facility] ENDOSCOPY;  Service: Gastroenterology;;   BIOPSY  09/13/2021   Procedure: BIOPSY;  Surgeon: Wilhelmenia Aloha Raddle., MD;  Location: [facility] ENDOSCOPY;  Service: Gastroenterology;;   CATARACT EXTRACTION W/ INTRAOCULAR LENS  IMPLANT, BILATERAL     COLON SURGERY     COLONOSCOPY N/A 08/04/2015   Procedure: COLONOSCOPY;  Surgeon: Gordy CHRISTELLA Starch, MD;  [facility] ENDOSCOPY;  Service: Endoscopy;  Laterality: N/A;   COLONOSCOPY  2017   JMP@ [facility]-good prep-mass -recall 1 yr   COLONOSCOPY N/A 09/13/2021   Procedure: COLONOSCOPY;  Surgeon: Mansouraty, Aloha Raddle., MD;  Location: [facility] ENDOSCOPY;  Service: Gastroenterology;  Laterality: N/A;   COLONOSCOPY WITH PROPOFOL  N/A 11/25/2020   Procedure: COLONOSCOPY WITH PROPOFOL ;  Surgeon: Federico Rosario BROCKS, MD;  Location: [facility] ENDOSCOPY;  Service: Gastroenterology;  Laterality: N/A;   COLONOSCOPY WITH PROPOFOL  N/A 09/23/2021   Procedure: COLONOSCOPY WITH PROPOFOL ;  Surgeon: San Sandor GAILS, DO;  [facility] ENDOSCOPY;  Service: Gastroenterology;  Laterality: N/A;   ENTEROSCOPY N/A 09/23/2021   Procedure: ENTEROSCOPY;  Surgeon: San Sandor GAILS, DO;  [facility] ENDOSCOPY;  Service: Gastroenterology;  Laterality: N/A;  Will place order for video capsule study as we may opt to place it during procedure   ESOPHAGOGASTRODUODENOSCOPY N/A 08/01/2019   Procedure: ESOPHAGOGASTRODUODENOSCOPY (EGD);  Surgeon: Kristie Lamprey, MD;  Location: [facility] ENDOSCOPY;  Service: Endoscopy;  Laterality: N/A;   ESOPHAGOGASTRODUODENOSCOPY N/A 11/18/2020   Procedure: ESOPHAGOGASTRODUODENOSCOPY (EGD);  Surgeon: Stacia Glendia BRAVO, MD;  Location: [facility] ENDOSCOPY;   Service: Gastroenterology;  Laterality: N/A;   ESOPHAGOGASTRODUODENOSCOPY (EGD) WITH PROPOFOL  N/A 08/04/2019   Procedure: ESOPHAGOGASTRODUODENOSCOPY (EGD) WITH PROPOFOL ;  Surgeon: Leigh Elspeth SQUIBB, MD;  Location: [facility] ENDOSCOPY;  Service: Gastroenterology;  Laterality: N/A;   ESOPHAGOGASTRODUODENOSCOPY (EGD) WITH PROPOFOL  N/A 09/13/2021   Procedure: ESOPHAGOGASTRODUODENOSCOPY (EGD) WITH PROPOFOL ;  Surgeon: Wilhelmenia Aloha Raddle., MD;  Location: [facility] ENDOSCOPY;  Service: Gastroenterology;  Laterality: N/A;   GIVENS CAPSULE STUDY N/A 09/23/2021   Procedure: GIVENS CAPSULE STUDY;  Surgeon: San Sandor GAILS, DO;  [facility] ENDOSCOPY;  Service: Gastroenterology;  Laterality: N/A;   graft left arm Left    for dialysis x 2. Removed   HOT HEMOSTASIS  11/18/2020   Procedure: HOT HEMOSTASIS (ARGON PLASMA COAGULATION/BICAP);  Surgeon: Stacia Glendia BRAVO, MD;  Location: [facility] ENDOSCOPY;  Service: Gastroenterology;;   HOT HEMOSTASIS N/A 09/23/2021   Procedure: HOT HEMOSTASIS (ARGON PLASMA COAGULATION/BICAP);  Surgeon: San Sandor GAILS, DO;  Location: [facility] ENDOSCOPY;  Service: Gastroenterology;  Laterality: N/A;   INSERTION OF DIALYSIS CATHETER     Rt chest   LAPAROSCOPIC RIGHT COLECTOMY N/A 08/05/2015   Procedure: LAPAROSCOPIC RIGHT COLECTOMY- ASCENDING;  Surgeon: Jina Nephew, MD;  [facility] OR;  Service: General;  Laterality: N/A;   MASS EXCISION Left 05/28/2019   Procedure: EXCISION SOFT TISSUE MASS LEFT SHOULDER;  Surgeon: Eletha Boas, MD;  Location: [facility] ORS;  Service: General;  Laterality: Left;   NEPHRECTOMY Bilateral    PARATHYROIDECTOMY N/A 06/12/2016   Procedure: TOTAL PARATHYROIDECTOMY WITH AUTOTRANSPLANTATION TO LEFT FOREARM;  Surgeon: Boas Eletha, MD;  Location: [facility] OR;  Service: General;  Laterality: N/A;   PARATHYROIDECTOMY N/A 10/23/2016   Procedure: PARATHYROIDECTOMY;  Surgeon: Eletha Boas, MD;  [facility] OR;  Service: General;  Laterality: N/A;   REVERSE SHOULDER ARTHROPLASTY Right  08/24/2020   Procedure: REVERSE SHOULDER ARTHROPLASTY;  Surgeon: Cristy Bonner DASEN, MD;  [facility];  Service: Orthopedics;  Laterality: Right;   REVISON OF ARTERIOVENOUS FISTULA Right 07/16/2017   Procedure: REVISION OF ARTERIOVENOUS FISTULA  Right ARM;  Surgeon: Sheree Penne Bruckner, MD;  Location: [facility] OR;  Service: Vascular;  Laterality: Right;   TOTAL HIP ARTHROPLASTY Left 11/14/2020   Procedure: LEFT TOTAL HIP ARTHROPLASTY ANTERIOR APPROACH;  Surgeon: [name] Kay HERO, MD;  Location[facility] OR;  Service: Orthopedics;  Laterality: Left;   UPPER GASTROINTESTINAL ENDOSCOPY  2021   @ [facility]   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    Parathyroid  abnormality    ectopic parathyroid  gland   Presence of arteriovenous fistula for hemodialysis, primary    RUE PER PT RLE   Refusal of blood product    NO WHOLE BLOOD PROUCTS   Renal cell carcinoma (HCC)    s/p hand assisted laparoscopic bilateral nephrectomies 11/29/17, + RCC left   Secondary hyperparathyroidism    Seizures (HCC)    one episode in past, due to elevated Potassium 08/02/20- at least 4 years ago   Sleep apnea    doesn't use CPAP anymore since weight loss   Stroke (HCC)    no residual   BP 113/66   Pulse 79   Ht 6' 1 (1.854 m)   SpO2 99%   BMI 25.33 kg/m   Opioid Risk Score:   Fall Risk Score:  `1  Depression screen [facility] 2/9     08/19/2023   11:54 AM  Depression screen PHQ 2/9  Decreased  Interest 1  Down, Depressed, Hopeless 2  PHQ - 2 Score 3  Altered sleeping 1  Tired, decreased energy 1  Change in appetite 2  Feeling bad or failure about yourself  1  Trouble concentrating 1  Moving slowly or fidgety/restless 1  Suicidal thoughts 0  PHQ-9 Score 10  Difficult doing work/chores Very difficult     Review of Systems  Musculoskeletal:        Right ankle pain  All other systems reviewed and are negative.      Objective:   Physical Exam  Awake, alert, appropriate, using RW - 6 ft 3 inches, NAD MSK: RUE_ deltoid- 4/5; Biceps, triceps 5/5; WE 5/5; grip 4-/5 and FA 3+/5 LUE- deltoid, biceps, triceps 5/5; Grip 3+ to 4-/5; FA 3+/5  RLE- HF 4-/5 to 3+/5; KE 4-/5; DF 4/5 and PF 4/5 LLE-  HF 4/5; KE 4/5; DF 4/5 and PF 4+/5  Moderate swelling of L>R knees  Also 2+ LE's edema-  to distal calves B/L  Neuro: No spasticity- no increased clonus Absent DTR's B/L      Assessment & Plan:  Pt is a 63 yr old with hx of traumatic C4 incomplete tetraplegia; ESRD on HD M/W/F, Anemia of chronic disease;  anuria; Recurrent GIB= Jehovah's witness, Orthostatic hypotension; Neurogenic bowel with constipation; prior SIRS;  R>L knee pain; And severe R hip pain due to end stage OA; Also have no kidneys at all - looking at kidney transplant.   Here for hospital f/u after d/c 07/12/23 from [facility].    Needs a grab bar in bathroom/shower- after moved to first floor- 3 BR- supposed to be handicapped bathroom.  Needs 2-  one in shower and 1 next to toilet. - Gave Rx for this.   2.  Will send in Rx for Midodrine  10 mg 30 minutes before dialysis-  #15 tabs-  5 refills- takes as needed  3.  On transplant list- keeps being taking off-  due to marijuana- but was told he could use it- so confusing for him.   4.  Doesn't need meds for spasticity- has none on exam  5.  Bowel going OK- not having incontinence- taking metamucil- as needed- occ imodium rarely-  Doesn't need bowel program  6 Have  anuria, so no neurogenic bladder  7.  Can continue PT at [facility]-  - his goal is to walk without RW- if insurance runs out for year - then can restart in January  8. Ask Dr [name] when can do L knee and R hip surgeries.    9. Has handicapped placard- let me know when.if they expire- so I can refill   10.   F/u with in 3 months-double appt- SCI   I spent a total of 40   minutes on total care today- >50% coordination of care- due to d/w pt about spasticity- education, and discussed issues as detailed above

## 2023-11-15 NOTE — Patient Instructions (Addendum)
 Pt is a 63 yr old with hx of traumatic C4 incomplete tetraplegia; ESRD on HD M/W/F, Anemia of chronic disease;  anuria; Recurrent GIB= Jehovah's witness, Orthostatic hypotension; Neurogenic bowel with constipation; prior SIRS;  R>L knee pain; And severe R hip pain due to end stage OA; Also have no kidneys at all - looking at kidney transplant.   Here for hospital f/u after d/c 07/12/23 from [facility].    Needs a grab bar in bathroom/shower- after moved to first floor- 3 BR- supposed to be handicapped bathroom.  Needs 2-  one in shower and 1 next to toilet. - Gave Rx for this.   2.  Will send in Rx for Midodrine  10 mg 30 minutes before dialysis-  #15 tabs-  5 refills- takes as needed  3.  On transplant list- keeps being taking off-  due to marijuana- but was told he could use it- so confusing for him.   4.  Doesn't need meds for spasticity- has none on exam- stiffness that won't go away watch for it- if gets it, call me  5.  Bowel going OK- not having incontinence- taking metamucil- as needed- occ imodium rarely-  Doesn't need bowel program  6 Have anuria, so no neurogenic bladder  7.  Can continue PT at [facility]-  - his goal is to walk without RW- if insurance runs out for year - then can restart in January  8. Ask Dr [name] when can do L knee and R hip surgeries.    9. Has handicapped placard- let me know when.if they expire- so I can refill   10.   F/u with in 3months

## 2023-11-15 NOTE — Telephone Encounter (Signed)
 Attempted to reach pt to schedule his AGM. Left VM for him to call us back.

## 2023-11-18 ENCOUNTER — Telehealth: Payer: Self-pay

## 2023-11-18 ENCOUNTER — Other Ambulatory Visit: Payer: Self-pay

## 2023-11-18 DIAGNOSIS — I70421 Atherosclerosis of autologous vein bypass graft(s) of the extremities with rest pain, right leg: Secondary | ICD-10-CM

## 2023-11-18 MED ORDER — SODIUM CHLORIDE 0.9% FLUSH
3.0000 mL | INTRAVENOUS | Status: DC | PRN
Start: 2023-11-18 — End: 2023-11-18

## 2023-11-18 MED ORDER — SODIUM CHLORIDE 0.9% FLUSH: 3.0000 mL | Freq: Two times a day (BID) | INTRAVENOUS | Status: DC

## 2023-11-18 MED ORDER — SODIUM CHLORIDE 0.9 % IV SOLN
250.0000 mL | INTRAVENOUS | Status: DC | PRN
Start: 2023-11-18 — End: 2023-11-18

## 2023-11-18 NOTE — Telephone Encounter (Signed)
 Attempted to reach pt to schedule surgery. Left VM for him to call us  back.

## 2023-11-19 ENCOUNTER — Encounter: Payer: Self-pay | Admitting: Physical Therapy

## 2023-11-19 ENCOUNTER — Ambulatory Visit: Attending: Family Medicine | Admitting: Physical Therapy

## 2023-11-19 DIAGNOSIS — R2681 Unsteadiness on feet: Secondary | ICD-10-CM | POA: Diagnosis present

## 2023-11-19 DIAGNOSIS — R296 Repeated falls: Secondary | ICD-10-CM | POA: Insufficient documentation

## 2023-11-19 DIAGNOSIS — R262 Difficulty in walking, not elsewhere classified: Secondary | ICD-10-CM | POA: Diagnosis present

## 2023-11-19 DIAGNOSIS — M6281 Muscle weakness (generalized): Secondary | ICD-10-CM | POA: Diagnosis present

## 2023-11-19 NOTE — Therapy (Signed)
 OUTPATIENT PHYSICAL THERAPY LOWER EXTREMITY TREATMENT    Patient Name: Carl Leinweber Sr. MRN: [id number] DOB:1960/11/04, 63 y.o., male Today's Date: 11/19/2023  END OF SESSION:  PT End of Session - 11/19/23 0851     Visit Number 12    Date for Recertification  12/05/23    PT Start Time 0850    PT Stop Time 0930    PT Time Calculation (min) 40 min    Activity Tolerance Patient tolerated treatment well    Behavior During Therapy [facility] for tasks assessed/performed           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    Parathyroid  abnormality    ectopic parathyroid  gland   Presence of arteriovenous fistula for hemodialysis, primary    RUE PER PT RLE   Refusal of blood product    NO WHOLE BLOOD PROUCTS   Renal cell carcinoma (HCC)    s/p hand assisted laparoscopic bilateral nephrectomies 11/29/17, + RCC left   Secondary hyperparathyroidism    Seizures (HCC)    one episode in past, due to elevated Potassium 08/02/20- at least 4 years ago   Sleep apnea    doesn't use CPAP anymore since weight loss   Stroke [facility])    no residual   Past Surgical History:  Procedure Laterality Date   ANTERIOR CERVICAL CORPECTOMY N/A 05/25/2023   Procedure: ANTERIOR CERVICAL CORPECTOMY CERVICAL FOUR, CERVICAL THREE - CERVICAL FIVE FUSION;  Surgeon: [name] Shove, MD;  Location: [facility] OR;  Service: Neurosurgery;  Laterality: N/A;   AV FISTULA PLACEMENT Right    right arm    BIOPSY  08/01/2019   Procedure: BIOPSY;  Surgeon: Kristie Lamprey, MD;  Location: [facility] ENDOSCOPY;  Service: Endoscopy;;   BIOPSY  11/18/2020   Procedure: BIOPSY;  Surgeon: Stacia Glendia BRAVO, MD;  Location: [facility] ENDOSCOPY;  Service: Gastroenterology;;   BIOPSY  09/13/2021   Procedure: BIOPSY;  Surgeon: Wilhelmenia Aloha Raddle., MD;  Location: [facility] ENDOSCOPY;  Service: Gastroenterology;;   CATARACT EXTRACTION W/ INTRAOCULAR LENS  IMPLANT, BILATERAL     COLON SURGERY     COLONOSCOPY N/A 08/04/2015   Procedure: COLONOSCOPY;  Surgeon: Gordy CHRISTELLA Starch, MD;  Location: [facility] ENDOSCOPY;  Service: Endoscopy;  Laterality: N/A;   COLONOSCOPY  2017   JMP@ [facility]-good prep-mass -recall 1 yr   COLONOSCOPY N/A 09/13/2021   Procedure: COLONOSCOPY;  Surgeon: Mansouraty, Aloha Raddle., MD;  Location: [facility] ENDOSCOPY;  Service: Gastroenterology;  Laterality: N/A;   COLONOSCOPY WITH PROPOFOL  N/A 11/25/2020   Procedure: COLONOSCOPY WITH PROPOFOL ;  Surgeon: Federico Rosario BROCKS, MD;  Location: [facility] ENDOSCOPY;  Service: Gastroenterology;  Laterality: N/A;   COLONOSCOPY WITH PROPOFOL  N/A 09/23/2021   Procedure: COLONOSCOPY WITH PROPOFOL ;  Surgeon: San Sandor GAILS, DO;  Location: [facility] ENDOSCOPY;  Service: Gastroenterology;  Laterality:  N/A;   ENTEROSCOPY N/A 09/23/2021   Procedure: ENTEROSCOPY;  Surgeon: San Sandor GAILS, DO;  Location: [facility] ENDOSCOPY;  Service: Gastroenterology;  Laterality: N/A;  Will place order for video capsule study as we may opt to place it during procedure   ESOPHAGOGASTRODUODENOSCOPY N/A 08/01/2019   Procedure: ESOPHAGOGASTRODUODENOSCOPY (EGD);  Surgeon: Kristie Lamprey, MD;  Location: [facility] ENDOSCOPY;  Service: Endoscopy;  Laterality: N/A;   ESOPHAGOGASTRODUODENOSCOPY N/A 11/18/2020   Procedure: ESOPHAGOGASTRODUODENOSCOPY (EGD);  Surgeon: Stacia Glendia BRAVO, MD;  Location: [facility] ENDOSCOPY;  Service: Gastroenterology;  Laterality: N/A;   ESOPHAGOGASTRODUODENOSCOPY (EGD) WITH PROPOFOL  N/A 08/04/2019   Procedure:  ESOPHAGOGASTRODUODENOSCOPY (EGD) WITH PROPOFOL ;  Surgeon: Leigh Elspeth SQUIBB, MD;  Location: [facility] ENDOSCOPY;  Service: Gastroenterology;  Laterality: N/A;   ESOPHAGOGASTRODUODENOSCOPY (EGD) WITH PROPOFOL  N/A 09/13/2021   Procedure: ESOPHAGOGASTRODUODENOSCOPY (EGD) WITH PROPOFOL ;  Surgeon: Wilhelmenia Aloha Raddle., MD;  Location: [facility] ENDOSCOPY;  Service: Gastroenterology;  Laterality: N/A;   GIVENS CAPSULE STUDY N/A 09/23/2021   Procedure: GIVENS CAPSULE STUDY;  Surgeon: San Sandor GAILS, DO;  Location: [facility] ENDOSCOPY;  Service: Gastroenterology;  Laterality: N/A;   graft left arm Left    for dialysis x 2. Removed   HOT HEMOSTASIS  11/18/2020   Procedure: HOT HEMOSTASIS (ARGON PLASMA COAGULATION/BICAP);  Surgeon: Stacia Glendia BRAVO, MD;  Location: [facility] ENDOSCOPY;  Service: Gastroenterology;;   HOT HEMOSTASIS N/A 09/23/2021   Procedure: HOT HEMOSTASIS (ARGON PLASMA COAGULATION/BICAP);  Surgeon: San Sandor GAILS, DO;  Location: [facility] ENDOSCOPY;  Service: Gastroenterology;  Laterality: N/A;   INSERTION OF DIALYSIS CATHETER     Rt chest   LAPAROSCOPIC RIGHT COLECTOMY N/A 08/05/2015   Procedure: LAPAROSCOPIC RIGHT COLECTOMY- ASCENDING;  Surgeon: Jina Nephew, MD;  Location: [facility] OR;  Service: General;  Laterality: N/A;   MASS EXCISION Left 05/28/2019   Procedure: EXCISION SOFT TISSUE MASS LEFT SHOULDER;  Surgeon: Eletha Boas, MD;  Location: [facility] ORS;  Service: General;  Laterality: Left;   NEPHRECTOMY Bilateral    PARATHYROIDECTOMY N/A 06/12/2016   Procedure: TOTAL PARATHYROIDECTOMY WITH AUTOTRANSPLANTATION TO LEFT FOREARM;  Surgeon: Boas Eletha, MD;  Location: [facility] OR;  Service: General;  Laterality: N/A;   PARATHYROIDECTOMY N/A 10/23/2016   Procedure: PARATHYROIDECTOMY;  Surgeon: Eletha Boas, MD;  Location: [facility] OR;  Service: General;  Laterality: N/A;   REVERSE SHOULDER ARTHROPLASTY Right 08/24/2020   Procedure: REVERSE SHOULDER ARTHROPLASTY;  Surgeon: Cristy Bonner DASEN, MD;  Location: [facility] OR;  Service: Orthopedics;   Laterality: Right;   REVISON OF ARTERIOVENOUS FISTULA Right 07/16/2017   Procedure: REVISION OF ARTERIOVENOUS FISTULA  Right ARM;  Surgeon: Sheree Penne Bruckner, MD;  Location: [facility] OR;  Service: Vascular;  Laterality: Right;   TOTAL HIP ARTHROPLASTY Left 11/14/2020   Procedure: LEFT TOTAL HIP ARTHROPLASTY ANTERIOR APPROACH;  Surgeon: [name] Kay HERO, MD;  Location: [facility] OR;  Service: Orthopedics;  Laterality: Left;   UPPER GASTROINTESTINAL ENDOSCOPY  2021   @ [facility]   Patient Active Problem List   Diagnosis Date Noted   Right knee buckling 11/15/2023   Primary osteoarthritis of right knee 11/15/2023   Abnormality of gait 11/15/2023   Primary osteoarthritis of left knee 10/03/2023   Right hip pain 07/14/2023   Coping style affecting medical condition 06/05/2023   Spinal cord injury, cervical region, sequela 05/30/2023   Cervical vertebral fracture (HCC) 05/25/2023   History of GI bleed 05/25/2023   C4 cervical fracture (HCC) 05/25/2023   Duodenitis    Gastritis and gastroduodenitis    Ischemic colitis    Bright red blood per rectum  ABLA (acute blood loss anemia)    Gastric ulcer    Duodenal ulcer    Diverticulosis of colon without hemorrhage    GI bleed 09/17/2021   Acute upper GI bleeding 09/11/2021   Mucosal abnormality of esophagus    Mucosal abnormality of stomach    Mucosal abnormality of duodenum    Upper GI bleed    Left hip pain 11/16/2020   Type 2 diabetes mellitus with other specified complication (HCC) 11/14/2020   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/14/2020   Primary osteoarthritis of left hip 10/07/2020   Status post reverse total arthroplasty of right shoulder 08/24/2020   Diverticulitis 03/08/2020   ESRD (end stage renal disease) (HCC) 03/08/2020   Polyneuropathy in diseases classified elsewhere 03/08/2020   Pure hypercholesterolemia 03/08/2020   Sacral back pain 03/08/2020   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 11/10/2019   Acute gastric ulcer with  hemorrhage    Acute GI bleeding 07/31/2019   Hypotension of hemodialysis 07/31/2019   Diverticulitis large intestine 07/31/2019   Mass of soft tissue of shoulder 05/26/2019   Awaiting organ transplant status 03/18/2019   Finding of cocaine in blood 02/25/2019   Anaphylactic shock, unspecified, sequela 11/21/2018   Hypercalcemia 10/28/2018   Diarrhea, unspecified 08/08/2018   Hyperkalemia 03/12/2018   Renal cell carcinoma of left kidney (HCC) 12/24/2017   Fluid overload, unspecified 07/06/2017   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   Other specified disorders of kidney and ureter 11/22/2016   Secondary hyperparathyroidism of renal origin 10/23/2016   Headache disorder 08/21/2016   TIA (transient ischemic attack)    Essential hypertension    Seizure (HCC)    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    Lung nodule    Diverticulitis of cecum 07/21/2015   Diverticulitis of large intestine without perforation or abscess without bleeding    Right lower quadrant pain    Obstructive sleep apnea 02/08/2015   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   Diverticulosis 07/13/2014   Lower GI bleed 07/12/2014   Refusal of blood transfusions as patient is Jehovah's Witness    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   HLD (hyperlipidemia)    PRES (posterior reversible encephalopathy syndrome)    History of CVA (cerebrovascular accident) 05/23/2014   Anemia of chronic disease 05/23/2014   GERD (gastroesophageal reflux disease) 12/09/2012   Gastrointestinal bleeding 11/12/2012   Melena 11/12/2012   Other psychoactive substance dependence, uncomplicated (HCC) 10/08/2012   Iron deficiency anemia, unspecified 06/10/2012   Moderate protein-calorie malnutrition 07/29/2009   Coagulation defect, unspecified 08/29/2006   Type 2 diabetes mellitus with diabetic peripheral angiopathy without gangrene (HCC) 08/29/2006    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 stage renal disease (HCC) 08/02/2006    PCP: [name] MD   REFERRING PROVIDER: Debby Fidela CROME, NP  REFERRING DIAG:  Diagnosis  S14.109S (ICD-10-CM) - Spinal cord injury, cervical region, sequela (HCC)    THERAPY DIAG:  Difficulty in walking, not elsewhere classified  Muscle weakness (generalized)  Unsteadiness on feet  Repeated falls  Rationale for Evaluation and Treatment: Rehabilitation  ONSET DATE:  ACDF 05/25/23  SUBJECTIVE:   SUBJECTIVE STATEMENT: Ankle stop hurting, will be getting a stint down his R leg, no falls since fallout out the shower   PERTINENT HISTORY: See above   ACDF C3-C5 fusion, anterior cervical corpectomy C3-C4  PAIN:  Are you having pain? 5/10 R leg  PRECAUTIONS:  Precautions:  Precautions Precautions: Fall, Cervical, Other (comment) Recall of Precautions/Restrictions: Impaired Precaution/Restrictions Comments: Verbally reviewed cervical precautions. Required Braces or Orthoses: Cervical Brace Cervical Brace: Soft collar, At all times Restrictions Weight Bearing Restrictions Per Provider Order: No General: Pain: No c/o pain.   RED FLAGS: None   WEIGHT BEARING RESTRICTIONS: No  FALLS:  Has patient fallen in last 6 months? Yes. Number of falls 3- one that led to surgery, then 2 after getting home from the hospital  LIVING ENVIRONMENT: Lives with: lives with their son Lives in: House/apartment Stairs: ground floor apt  Has following equipment at home: Single point cane, Environmental consultant - 2 wheeled, and Environmental consultant - 4 wheeled  OCCUPATION: retired- used to pressure wash   PLOF: Independent, Independent with basic ADLs, Independent with gait, and Independent with transfers  PATIENT GOALS: get back to normal ability, drive convertible again   NEXT MD VISIT: Dr. Cornelio 11/15/23  OBJECTIVE:  Note: Objective measures were completed at Evaluation unless  otherwise noted.  DIAGNOSTIC FINDINGS:    CLINICAL DATA:  63 year old male with altered mental status.   EXAM: CT HEAD WITHOUT CONTRAST   TECHNIQUE: Contiguous axial images were obtained from the base of the skull through the vertex without intravenous contrast.   RADIATION DOSE REDUCTION: This exam was performed according to the departmental dose-optimization program which includes automated exposure control, adjustment of the mA and/or kV according to patient size and/or use of iterative reconstruction technique.   COMPARISON:  Brain MRI and head CT 05/25/2023.   FINDINGS: Brain:   Unusual extensive, diffuse pachymeningeal calcification which has been present since at least 2013. Stable cerebral volume. No midline shift, ventriculomegaly, mass effect, evidence of mass lesion, intracranial hemorrhage or evidence of cortically based acute infarction. Small area of chronic encephalomalacia in the medial left occipital lobe redemonstrated. Stable gray-white matter differentiation throughout the brain.   Vascular: Chronic severe pachymeningeal calcification.   Skull: Appears stable.  No acute osseous abnormality identified.   Sinuses/Orbits: Continued left middle ear and mastoid opacification. Other Visualized paranasal sinuses and mastoids are stable and well aerated.   Other: Anterior left scalp soft tissue injury has regressed since earlier in the month. Orbits soft tissues appears stable.   IMPRESSION: 1. No acute intracranial abnormality. 2. 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 3. Ongoing left middle ear and mastoid opacification. No acute osseous abnormality identified. 4. Regressed scalp soft tissue injury since 05/25/2023.  CLINICAL DATA:  Chronic bilateral knee pain and swelling.   EXAM: RIGHT KNEE - COMPLETE 4+ VIEW; LEFT KNEE - COMPLETE 4+ VIEW   COMPARISON:  Right knee radiographs  dated 12/26/2021.   FINDINGS: Right knee:   No acute fracture or dislocation. There is a small to moderate-sized joint effusion. Moderate tricompartmental degenerative changes with joint space narrowing and marginal osteophytosis. Vascular calcifications are noted.   Left knee:   No acute fracture or dislocation. There is a small joint effusion. Moderate tricompartmental degenerative changes with joint space narrowing and marginal osteophytosis. Vascular calcifications are noted.   IMPRESSION: 1. No acute osseous abnormality. 2. 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  Narrative & Impression  CLINICAL DATA:  Chronic bilateral knee pain and swelling.   EXAM: RIGHT KNEE - COMPLETE 4+ VIEW; LEFT KNEE - COMPLETE 4+ VIEW   COMPARISON:  Right knee radiographs dated 12/26/2021.   FINDINGS: Right knee:   No acute fracture or dislocation. There is  a small to moderate-sized joint effusion. Moderate tricompartmental degenerative changes with joint space narrowing and marginal osteophytosis. Vascular calcifications are noted.   Left knee:   No acute fracture or dislocation. There is a small joint effusion. Moderate tricompartmental degenerative changes with joint space narrowing and marginal osteophytosis. Vascular calcifications are noted.   IMPRESSION: 1. No acute osseous abnormality. 2. 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    Narrative & Impression CLINICAL DATA:  Right hip pain.   EXAM: DG HIP (WITH OR WITHOUT PELVIS) 2-3V RIGHT   COMPARISON:  None Available.   FINDINGS: No acute fracture or dislocation. The bones are osteopenic. Cystic changes of the right femoral head and neck measure up to 3.7 cm. Orthopedic referral is advised. Mild arthritic changes of the right hip. Total left hip arthroplasty.  The soft tissues are unremarkable. Vascular calcifications.   IMPRESSION: 1. No acute fracture or dislocation. 2. Cystic changes of the right femoral head and neck. Orthopedic referral is advised.  Lower Venous DVT Study   Patient Name:  Donna Silverman Sr.  Date of Exam:   06/10/2023  Medical Rec #: [id number]            Accession #:    [id number]  Date of Birth: [birth year]             Patient Gender: M  Patient Age:   81 years  Exam Location[facility]  [facility]  Procedure:      VAS US  LOWER EXTREMITY VENOUS (DVT)  Referring Phys: PRENTICE COMPTON    - PATIENT SURVEYS:  PSFS: THE PATIENT SPECIFIC FUNCTIONAL SCALE  Place score of 0-10 (0 = unable to perform activity and 10 = able to perform activity at the same level as before injury or problem)  Activity Date: eval 09/12/23 11/07/23   Getting in and out of car (anything lower than 20 inches)  0 0   2. Balance  3 5   3. Walking without a device  2 5   4.  Social activities  0 0   Total Score 1.25 2.5     Total Score = Sum of activity scores/number of activities  Minimally Detectable Change: 3 points (for single activity); 2 points (for average score)  [employer] [facility] (nd). The Patient Specific Functional Scale . Retrieved from [email or web address]   COGNITION: Overall cognitive status: Within functional limits for tasks assessed     SENSATION: Hx of neuropathy even before fall/surgery     LOWER EXTREMITY MMT:  MMT Right eval Left eval Right 10/24/23 Left 10/24/23 RT/Left 10/31/23 RT/L 11/05/23 RT/L 11/19/23  Hip flexion 3- 3+ 3 3+ 3/3+ 3+/4 4-/4+  Hip extension         Hip abduction         Hip adduction         Hip internal rotation         Hip external rotation         Knee flexion 3- 4+ 4- 5 4/5 4/5 5/5  Knee extension 3- 5 3- 5 3-/5 3-/5 3/5  Ankle dorsiflexion 4 5 5 5      Ankle plantarflexion         Ankle inversion         Ankle eversion           (Blank rows = not tested)    GAIT: Distance walked: in clinic distances  Assistive device utilized: Environmental consultant - 2  wheeled Level of assistance: Modified independence Comments: slow but steady with RW                                                                                                                                TREATMENT DATE:  107/25 NuStep L 5 x 6 min Goals Leg pres 40lb 3x10 6in step ups 2 rails x10 each 4in lateral step ups x10 each 2 rails LAQ 2x10  HS curls green x10   11/07/23 NuStep L 5 x 7 min Sit to stand 2x10 form elevated surface with UE use Leg press 40lb 2x10 6in step ups with 2 rails x10 Heel raises 2x10 LAQ 2lb 2x10  11/05/23 NuStep L5 x 6 min Goals  Sit to stands w/ UE from elevated mat 2x10 6in step ups with UE assit x10 on L, x5 on R  10/31/23 Nustep L 5 STGs assessed including MMT Leg Press 40# 2 sets 10, SL 10 x BIL 20#, AA with RT to start 6 inch step up with UE support 10 x BIL CG- min A 4 inch step down with UE 10 xBIL CG-min A STS no hands CG-min A with cuing 28 in , could not do lower 10 x  10/29/23 Nustep L 5 BIL ankle red tband 4 way 10x 2 sets  Green tband HS curl 2 sets 10 LAQ 3# 2 sets 10 Standing with RW 3# 10 x SL hip flex,ext and abd,marching and HS curl Leg press 40# 2 sets 10. 20# SL 10 x BIL , assist to start on RT Green tband HS curl 2 sets 10     10/24/23 NuStep L 5 x 6 min Goals  Stairs heavy UE use come SGA alt pattern 2 rails Sit to stand from elevated surface with compensation due to weakness LAQ 2lb 2x10  Eccentrics with RLE Gait 50 feet No AD CGA   Decrease heel strike and step length   10/22/23 Nustep L 5, 6 min Standing reaching outside base of support  Side steps at mat able  Leg press 40lb 2x10   SL 20lb x10 each assist need with RLE  Gait 86ft CGA cues for erect posture   Then 76ft LAQ 2lb 2x10  Eccentrics with RLE  10/16/23:  Nustep L 5, 6 min Lat rows(airex in seat) 20# 2 x  10 Rows standing 15# 2 x 10 Knee ext B 5# 2 x 10 Knee flexion 25# 2 x 10, 35# 1 x 10 Sit to stand from elevated mat 7 x Chest presses for, side, side 10 x each blue mediball   10/10/23 Nustep L 5 Green tband HS curls 2 sets 10 3# LAQ 2 sets 10 3# standing with RW 10 x BIL-marching,hip flex,ext ,abd and HS curl HHA 3# marching fwd and back 15 feet 2 x each HHA 3# side stepping 15 feet 2 x each Leg Press 20# BIL 10 x seat on 9,  SL 10 x each 20 #  left leg seat on 11, RT needs assist to start mvmt. 20# BIL 10 x seat 8     PATIENT EDUCATION:  Education details: exam findings, POC Person educated: Patient Education method: Explanation Education comprehension: verbalized understanding, returned demonstration, and needs further education  HOME EXERCISE PROGRAM: Educ on getting ankle wts, adjustable up to 5 is rec as we used 3 #- okayed doing seated and standing ex with RW  ASSESSMENT:  CLINICAL IMPRESSION: Pt enters doing ok. Some progressing increasing his LE strength. All mobility without RW requiring CGA He continues to have weakness in both LE's RLE Pt struggles with initial unlocking on knees for eccentric lowering with step ups.  Pt also has difficulty standing from standard height chair. Heavy UE use with step ups more so when stepping up with RLE.  Cues to hold contraction needed with LAQ.   Patient is a 63 y.o. M who was seen today for physical therapy evaluation and treatment for S14.109S (ICD-10-CM) - Spinal cord injury, cervical region, sequela (HCC) Pt tolerated a initial progression to therapy well. RLE is significantly weaker than R. Pt had log legs and a relatively short torso making it difficult to stand from low surfaces in conjunction with weak LE. Decrease coordination with side steps. No pain during session. Limited ROM with RLE during isolated exercises.  Will make every effort to improve function and work towards goals.   OBJECTIVE IMPAIRMENTS: Abnormal gait,  decreased activity tolerance, decreased balance, decreased coordination, decreased knowledge of use of DME, decreased mobility, difficulty walking, decreased strength, postural dysfunction, and pain.   ACTIVITY LIMITATIONS: standing, squatting, stairs, transfers, bed mobility, toileting, dressing, and locomotion level  PARTICIPATION LIMITATIONS: shopping, community activity, and yard work  PERSONAL FACTORS: Age, Behavior pattern, Education, Fitness, Past/current experiences, Profession, Social background, and Time since onset of injury/illness/exacerbation are also affecting patient's functional outcome.   REHAB POTENTIAL: Good  CLINICAL DECISION MAKING: Evolving/moderate complexity  EVALUATION COMPLEXITY: Moderate   GOALS: Goals reviewed with patient? No  SHORT TERM GOALS: Target date: 10/24/2023   Will be compliant with appropriate progressive HEP  Baseline: Goal status: 10/08/23 MET  2.  Will be able to consistently perform functional STS tranfers from surfaces 20 inches or less  Baseline:  Goal status:10/08/23 on going  10/31/23 progressing still difficult for pt, 11/05/23 progressing still difficult for pt uses his UE, ongoing 11/19/23   3.  Will be able to ambulate at least 366ft with quad cane no more than light MinA  Baseline:  Goal status: 10/08/23 progressing  and 10/31/23  4.  MMT to be at least 3+/5 in all tested groups R LE  Baseline:  Goal status: ongoing 10/24/23  and 10/31/23, Progressing 11/19/23    LONG TERM GOALS: Target date: 12/05/2023      R LE MMT to be at least 4-/5 in all tested groups  Baseline:  Goal status: ongoing 10/24/23, Ongoing 11/05/23, Ongoing 11/19/23  2.  Will score at least 18 on DGI with no device to show reduced fall risk  Baseline:  Goal status: INITIAL  3.  Will be able to reciprocally ascend/descend steps without difficulty and U rail  Baseline:  Goal status: Ongoing 10/24/23  4.  Will be able to ambulate community distances with  LRAD, Mod(I) to allow for improved community access and participating in social activities  Baseline:  Goal status: Progressing 11/05/23  5.  Will be compliant with appropriate gym based exercise program by time  of DC  Baseline:  Goal status: reports going to gym some with his son , using bicycle., Progressing 11/05/23 goes with his son    PLAN:  PT FREQUENCY: 2x/week  PT DURATION: 12 weeks  PLANNED INTERVENTIONS: 97750- Physical Performance Testing, 97110-Therapeutic exercises, 97530- Therapeutic activity, 97112- Neuromuscular re-education, 97535- Self Care, 02859- Manual therapy, and [id number]- Gait training  PLAN FOR NEXT SESSION: progress func strength, gait and balance. Jon Payseur PTA  11/19/23 8:52 AM

## 2023-11-21 ENCOUNTER — Ambulatory Visit: Admitting: Physical Therapy

## 2023-11-21 DIAGNOSIS — R262 Difficulty in walking, not elsewhere classified: Secondary | ICD-10-CM | POA: Diagnosis not present

## 2023-11-21 DIAGNOSIS — R2681 Unsteadiness on feet: Secondary | ICD-10-CM

## 2023-11-21 DIAGNOSIS — M6281 Muscle weakness (generalized): Secondary | ICD-10-CM

## 2023-11-21 NOTE — Therapy (Signed)
 OUTPATIENT PHYSICAL THERAPY LOWER EXTREMITY TREATMENT    Patient Name: Barlow Harrison Sr. MRN: [id number] DOB:13-Jan-1961, 63 y.o., male Today's Date: 11/21/2023  END OF SESSION:  PT End of Session - 11/21/23 0843     Visit Number 13    Number of Visits 25    Date for Recertification  12/05/23    Authorization Type [employer] Dual    Authorization Time Period 09/12/23 to 12/05/23    PT Start Time 0845    PT Stop Time 0930    PT Time Calculation (min) 45 min           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    Parathyroid  abnormality    ectopic parathyroid  gland   Presence of arteriovenous fistula for hemodialysis, primary    RUE PER PT RLE   Refusal of blood product    NO WHOLE BLOOD PROUCTS   Renal cell carcinoma (HCC)    s/p hand assisted laparoscopic bilateral nephrectomies 11/29/17, + RCC left   Secondary hyperparathyroidism    Seizures (HCC)    one episode in past, due to elevated Potassium 08/02/20- at least 4 years ago   Sleep apnea    doesn't use CPAP anymore since weight loss   Stroke [facility])    no residual   Past Surgical History:  Procedure Laterality Date   ANTERIOR CERVICAL CORPECTOMY N/A 05/25/2023   Procedure: ANTERIOR CERVICAL CORPECTOMY CERVICAL FOUR, CERVICAL THREE - CERVICAL FIVE FUSION;  Surgeon: [name] Shove, MD;  Location: [facility] OR;  Service: Neurosurgery;  Laterality: N/A;   AV FISTULA PLACEMENT Right    right arm   BIOPSY   08/01/2019   Procedure: BIOPSY;  Surgeon: Kristie Lamprey, MD;  Location: [facility] ENDOSCOPY;  Service: Endoscopy;;   BIOPSY  11/18/2020   Procedure: BIOPSY;  Surgeon: Stacia Glendia BRAVO, MD;  Location: [facility] ENDOSCOPY;  Service: Gastroenterology;;   BIOPSY  09/13/2021   Procedure: BIOPSY;  Surgeon: Wilhelmenia Aloha Raddle., MD;  Location: [facility] ENDOSCOPY;  Service: Gastroenterology;;   CATARACT EXTRACTION W/ INTRAOCULAR LENS  IMPLANT, BILATERAL     COLON SURGERY     COLONOSCOPY N/A 08/04/2015   Procedure: COLONOSCOPY;  Surgeon: Gordy CHRISTELLA Starch, MD;  Location: [facility] ENDOSCOPY;  Service: Endoscopy;  Laterality: N/A;   COLONOSCOPY  2017   JMP@ [facility]-good prep-mass -recall 1 yr   COLONOSCOPY N/A 09/13/2021   Procedure: COLONOSCOPY;  Surgeon: Mansouraty, Aloha Raddle., MD;  Location: [facility] ENDOSCOPY;  Service: Gastroenterology;  Laterality: N/A;   COLONOSCOPY WITH PROPOFOL  N/A 11/25/2020   Procedure: COLONOSCOPY WITH PROPOFOL ;  Surgeon: Federico Rosario BROCKS, MD;  Location: [facility] ENDOSCOPY;  Service: Gastroenterology;  Laterality: N/A;   COLONOSCOPY WITH PROPOFOL  N/A 09/23/2021   Procedure: COLONOSCOPY WITH PROPOFOL ;  Surgeon: San Sandor GAILS, DO;  Location: [facility] ENDOSCOPY;  Service: Gastroenterology;  Laterality: N/A;   ENTEROSCOPY N/A 09/23/2021   Procedure: ENTEROSCOPY;  Surgeon: San Sandor GAILS, DO;  Location: [facility] ENDOSCOPY;  Service: Gastroenterology;  Laterality: N/A;  Will place order for video capsule study as we may opt to place it during procedure   ESOPHAGOGASTRODUODENOSCOPY N/A 08/01/2019   Procedure: ESOPHAGOGASTRODUODENOSCOPY (EGD);  Surgeon: Kristie Lamprey, MD;  Location: [facility] ENDOSCOPY;  Service: Endoscopy;  Laterality: N/A;   ESOPHAGOGASTRODUODENOSCOPY N/A 11/18/2020   Procedure: ESOPHAGOGASTRODUODENOSCOPY (EGD);  Surgeon: Stacia Glendia BRAVO, MD;  Location: [facility] ENDOSCOPY;  Service: Gastroenterology;  Laterality: N/A;   ESOPHAGOGASTRODUODENOSCOPY (EGD) WITH PROPOFOL  N/A 08/04/2019   Procedure: ESOPHAGOGASTRODUODENOSCOPY  (EGD) WITH PROPOFOL ;  Surgeon: Leigh Elspeth SQUIBB, MD;  Location: [facility] ENDOSCOPY;  Service: Gastroenterology;  Laterality: N/A;   ESOPHAGOGASTRODUODENOSCOPY (EGD) WITH PROPOFOL  N/A 09/13/2021   Procedure: ESOPHAGOGASTRODUODENOSCOPY (EGD) WITH PROPOFOL ;  Surgeon: Wilhelmenia Aloha Raddle., MD;  Location: [facility] ENDOSCOPY;  Service: Gastroenterology;  Laterality: N/A;   GIVENS CAPSULE STUDY N/A 09/23/2021   Procedure: GIVENS CAPSULE STUDY;  Surgeon: San Sandor GAILS, DO;  Location: [facility] ENDOSCOPY;  Service: Gastroenterology;  Laterality: N/A;   graft left arm Left    for dialysis x 2. Removed   HOT HEMOSTASIS  11/18/2020   Procedure: HOT HEMOSTASIS (ARGON PLASMA COAGULATION/BICAP);  Surgeon: Stacia Glendia BRAVO, MD;  Location: [facility] ENDOSCOPY;  Service: Gastroenterology;;   HOT HEMOSTASIS N/A 09/23/2021   Procedure: HOT HEMOSTASIS (ARGON PLASMA COAGULATION/BICAP);  Surgeon: San Sandor GAILS, DO;  Location: [facility] ENDOSCOPY;  Service: Gastroenterology;  Laterality: N/A;   INSERTION OF DIALYSIS CATHETER     Rt chest   LAPAROSCOPIC RIGHT COLECTOMY N/A 08/05/2015   Procedure: LAPAROSCOPIC RIGHT COLECTOMY- ASCENDING;  Surgeon: Jina Nephew, MD;  Location: [facility] OR;  Service: General;  Laterality: N/A;   MASS EXCISION Left 05/28/2019   Procedure: EXCISION SOFT TISSUE MASS LEFT SHOULDER;  Surgeon: Eletha Boas, MD;  Location: [facility] ORS;  Service: General;  Laterality: Left;   NEPHRECTOMY Bilateral    PARATHYROIDECTOMY N/A 06/12/2016   Procedure: TOTAL PARATHYROIDECTOMY WITH AUTOTRANSPLANTATION TO LEFT FOREARM;  Surgeon: Boas Eletha, MD;  Location: [facility] OR;  Service: General;  Laterality: N/A;   PARATHYROIDECTOMY N/A 10/23/2016   Procedure: PARATHYROIDECTOMY;  Surgeon: Eletha Boas, MD;  Location: [facility] OR;  Service: General;  Laterality: N/A;   REVERSE SHOULDER ARTHROPLASTY Right 08/24/2020   Procedure: REVERSE SHOULDER ARTHROPLASTY;  Surgeon: Cristy Bonner DASEN, MD;  Location: [facility] OR;  Service: Orthopedics;  Laterality: Right;    REVISON OF ARTERIOVENOUS FISTULA Right 07/16/2017   Procedure: REVISION OF ARTERIOVENOUS FISTULA  Right ARM;  Surgeon: Sheree Penne Bruckner, MD;  Location: [facility] OR;  Service: Vascular;  Laterality: Right;   TOTAL HIP ARTHROPLASTY Left 11/14/2020   Procedure: LEFT TOTAL HIP ARTHROPLASTY ANTERIOR APPROACH;  Surgeon: [name] Kay HERO, MD;  Location: [facility] OR;  Service: Orthopedics;  Laterality: Left;   UPPER GASTROINTESTINAL ENDOSCOPY  2021   @ [facility]   Patient Active Problem List   Diagnosis Date Noted   Right knee buckling 11/15/2023   Primary osteoarthritis of right knee 11/15/2023   Abnormality of gait 11/15/2023   Primary osteoarthritis of left knee 10/03/2023   Right hip pain 07/14/2023   Coping style affecting medical condition 06/05/2023   Spinal cord injury, cervical region, sequela 05/30/2023   Cervical vertebral fracture (HCC) 05/25/2023   History of GI bleed 05/25/2023   C4 cervical fracture (HCC) 05/25/2023   Duodenitis    Gastritis and gastroduodenitis    Ischemic colitis    Bright red  blood per rectum    ABLA (acute blood loss anemia)    Gastric ulcer    Duodenal ulcer    Diverticulosis of colon without hemorrhage    GI bleed 09/17/2021   Acute upper GI bleeding 09/11/2021   Mucosal abnormality of esophagus    Mucosal abnormality of stomach    Mucosal abnormality of duodenum    Upper GI bleed    Left hip pain 11/16/2020   Type 2 diabetes mellitus with other specified complication (HCC) 11/14/2020   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/14/2020   Primary osteoarthritis of left hip 10/07/2020   Status post reverse total arthroplasty of right shoulder 08/24/2020   Diverticulitis 03/08/2020   ESRD (end stage renal disease) (HCC) 03/08/2020   Polyneuropathy in diseases classified elsewhere 03/08/2020   Pure hypercholesterolemia 03/08/2020   Sacral back pain 03/08/2020   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 11/10/2019   Acute gastric ulcer with hemorrhage    Acute  GI bleeding 07/31/2019   Hypotension of hemodialysis 07/31/2019   Diverticulitis large intestine 07/31/2019   Mass of soft tissue of shoulder 05/26/2019   Awaiting organ transplant status 03/18/2019   Finding of cocaine in blood 02/25/2019   Anaphylactic shock, unspecified, sequela 11/21/2018   Hypercalcemia 10/28/2018   Diarrhea, unspecified 08/08/2018   Hyperkalemia 03/12/2018   Renal cell carcinoma of left kidney (HCC) 12/24/2017   Fluid overload, unspecified 07/06/2017   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   Other specified disorders of kidney and ureter 11/22/2016   Secondary hyperparathyroidism of renal origin 10/23/2016   Headache disorder 08/21/2016   TIA (transient ischemic attack)    Essential hypertension    Seizure (HCC)    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    Lung nodule    Diverticulitis of cecum 07/21/2015   Diverticulitis of large intestine without perforation or abscess without bleeding    Right lower quadrant pain    Obstructive sleep apnea 02/08/2015   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   Diverticulosis 07/13/2014   Lower GI bleed 07/12/2014   Refusal of blood transfusions as patient is Jehovah's Witness    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   HLD (hyperlipidemia)    PRES (posterior reversible encephalopathy syndrome)    History of CVA (cerebrovascular accident) 05/23/2014   Anemia of chronic disease 05/23/2014   GERD (gastroesophageal reflux disease) 12/09/2012   Gastrointestinal bleeding 11/12/2012   Melena 11/12/2012   Other psychoactive substance dependence, uncomplicated (HCC) 10/08/2012   Iron deficiency anemia, unspecified 06/10/2012   Moderate protein-calorie malnutrition 07/29/2009   Coagulation defect, unspecified 08/29/2006   Type 2 diabetes mellitus with diabetic peripheral angiopathy without gangrene (HCC) 08/29/2006   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 5/10 R leg  PRECAUTIONS:  Precautions:  Precautions Precautions: Fall, Cervical, Other (comment) Recall of Precautions/Restrictions: Impaired Precaution/Restrictions Comments: Verbally  reviewed cervical precautions. Required Braces or Orthoses: Cervical Brace Cervical Brace: Soft collar, At all times Restrictions Weight Bearing Restrictions Per Provider Order: No General: Pain: No c/o pain.   RED FLAGS: None   WEIGHT BEARING RESTRICTIONS: No  FALLS:  Has patient fallen in last 6 months? Yes. Number of falls 3- one that led to surgery, then 2 after getting home from the hospital  LIVING ENVIRONMENT: Lives with: lives with their son Lives in: House/apartment Stairs: ground floor apt  Has following equipment at home: Single point cane, Environmental consultant - 2 wheeled, and Environmental consultant - 4 wheeled  OCCUPATION: retired- used to pressure wash   PLOF: Independent, Independent with basic ADLs, Independent with gait, and Independent with transfers  PATIENT GOALS: get back to normal ability, drive convertible again   NEXT MD VISIT: Dr. Cornelio 11/15/23  OBJECTIVE:  Note: Objective measures were completed at Evaluation unless otherwise noted.  DIAGNOSTIC FINDINGS:    CLINICAL DATA:  63 year old male with altered mental status.   EXAM: CT HEAD WITHOUT CONTRAST    TECHNIQUE: Contiguous axial images were obtained from the base of the skull through the vertex without intravenous contrast.   RADIATION DOSE REDUCTION: This exam was performed according to the departmental dose-optimization program which includes automated exposure control, adjustment of the mA and/or kV according to patient size and/or use of iterative reconstruction technique.   COMPARISON:  Brain MRI and head CT 05/25/2023.   FINDINGS: Brain:   Unusual extensive, diffuse pachymeningeal calcification which has been present since at least 2013. Stable cerebral volume. No midline shift, ventriculomegaly, mass effect, evidence of mass lesion, intracranial hemorrhage or evidence of cortically based acute infarction. Small area of chronic encephalomalacia in the medial left occipital lobe redemonstrated. Stable gray-white matter differentiation throughout the brain.   Vascular: Chronic severe pachymeningeal calcification.   Skull: Appears stable.  No acute osseous abnormality identified.   Sinuses/Orbits: Continued left middle ear and mastoid opacification. Other Visualized paranasal sinuses and mastoids are stable and well aerated.   Other: Anterior left scalp soft tissue injury has regressed since earlier in the month. Orbits soft tissues appears stable.   IMPRESSION: 1. No acute intracranial abnormality. 2. 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 3. Ongoing left middle ear and mastoid opacification. No acute osseous abnormality identified. 4. Regressed scalp soft tissue injury since 05/25/2023.  CLINICAL DATA:  Chronic bilateral knee pain and swelling.   EXAM: RIGHT KNEE - COMPLETE 4+ VIEW; LEFT KNEE - COMPLETE 4+ VIEW   COMPARISON:  Right knee radiographs dated 12/26/2021.   FINDINGS: Right knee:   No acute fracture or dislocation. There is a small to moderate-sized joint effusion. Moderate  tricompartmental degenerative changes with joint space narrowing and marginal osteophytosis. Vascular calcifications are noted.   Left knee:   No acute fracture or dislocation. There is a small joint effusion. Moderate tricompartmental degenerative changes with joint space narrowing and marginal osteophytosis. Vascular calcifications are noted.   IMPRESSION: 1. No acute osseous abnormality. 2. 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  Narrative & Impression  CLINICAL DATA:  Chronic bilateral knee pain and swelling.   EXAM: RIGHT KNEE - COMPLETE 4+ VIEW; LEFT KNEE - COMPLETE 4+ VIEW   COMPARISON:  Right knee radiographs dated 12/26/2021.   FINDINGS: Right knee:   No acute fracture or dislocation. There is a small to moderate-sized joint effusion. Moderate tricompartmental degenerative changes with joint space narrowing and  marginal osteophytosis. Vascular calcifications are noted.   Left knee:   No acute fracture or dislocation. There is a small joint effusion. Moderate tricompartmental degenerative changes with joint space narrowing and marginal osteophytosis. Vascular calcifications are noted.   IMPRESSION: 1. No acute osseous abnormality. 2. 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    Narrative & Impression CLINICAL DATA:  Right hip pain.   EXAM: DG HIP (WITH OR WITHOUT PELVIS) 2-3V RIGHT   COMPARISON:  None Available.   FINDINGS: No acute fracture or dislocation. The bones are osteopenic. Cystic changes of the right femoral head and neck measure up to 3.7 cm. Orthopedic referral is advised. Mild arthritic changes of the right hip. Total left hip arthroplasty. The soft tissues are unremarkable. Vascular calcifications.   IMPRESSION: 1. No acute fracture or dislocation. 2. Cystic changes of the  right femoral head and neck. Orthopedic referral is advised.  Lower Venous DVT Study   Patient Name:  Turner Baillie Sr.  Date of Exam:   06/10/2023  Medical Rec #: [id number]            Accession #:    [id number]  Date of Birth: 01-12-1961             Patient Gender: M  Patient Age:   61 years  Exam Location[facility]  [facility]  Procedure:      VAS US  LOWER EXTREMITY VENOUS (DVT)  Referring Phys: PRENTICE COMPTON    - PATIENT SURVEYS:  PSFS: THE PATIENT SPECIFIC FUNCTIONAL SCALE  Place score of 0-10 (0 = unable to perform activity and 10 = able to perform activity at the same level as before injury or problem)  Activity Date: eval 09/12/23 11/07/23   Getting in and out of car (anything lower than 20 inches)  0 0   2. Balance  3 5   3. Walking without a device  2 5   4.  Social activities  0 0   Total Score 1.25 2.5     Total Score = Sum of activity scores/number of activities  Minimally Detectable Change: 3 points (for single activity); 2 points (for average score)  [facility] (nd). The Patient Specific Functional Scale . Retrieved from [email or web address]   COGNITION: Overall cognitive status: Within functional limits for tasks assessed     SENSATION: Hx of neuropathy even before fall/surgery     LOWER EXTREMITY MMT:  MMT Right eval Left eval Right 10/24/23 Left 10/24/23 RT/Left 10/31/23 RT/L 11/05/23 RT/L 11/19/23  Hip flexion 3- 3+ 3 3+ 3/3+ 3+/4 4-/4+  Hip extension         Hip abduction         Hip adduction         Hip internal rotation         Hip external rotation         Knee flexion 3- 4+ 4- 5 4/5 4/5 5/5  Knee extension 3- 5 3- 5 3-/5 3-/5 3/5  Ankle dorsiflexion 4 5 5 5      Ankle plantarflexion         Ankle inversion         Ankle eversion          (Blank rows = not tested)    GAIT: Distance walked: in clinic distances  Assistive device utilized: Environmental consultant - 2 wheeled Level of  assistance: Modified independence Comments: slow but steady with RW  TREATMENT DATE:   11/21/23 30# resited gait fwd 5 x min A Nustep L 5 LE only Leg pres 40lb 10x 50# 2 sets 10 HS curls 35# 10 x BIL. LLE 25# 10 x, RT LE 20# 10 x Knee Ext 5# 10 x LLE, unable to do RT STS 25in 2 sets 5 no UE, CG-min A with cuing for eccentric lowering LAQ RT LE 2 sets 10  107/25 NuStep L 5 x 6 min Goals Leg pres 40lb 3x10 6in step ups 2 rails x10 each 4in lateral step ups x10 each 2 rails LAQ 2x10  HS curls green x10   11/07/23 NuStep L 5 x 7 min Sit to stand 2x10 form elevated surface with UE use Leg press 40lb 2x10 6in step ups with 2 rails x10 Heel raises 2x10 LAQ 2lb 2x10  11/05/23 NuStep L5 x 6 min Goals  Sit to stands w/ UE from elevated mat 2x10 6in step ups with UE assit x10 on L, x5 on R  10/31/23 Nustep L 5 STGs assessed including MMT Leg Press 40# 2 sets 10, SL 10 x BIL 20#, AA with RT to start 6 inch step up with UE support 10 x BIL CG- min A 4 inch step down with UE 10 xBIL CG-min A STS no hands CG-min A with cuing 28 in , could not do lower 10 x  10/29/23 Nustep L 5 BIL ankle red tband 4 way 10x 2 sets  Green tband HS curl 2 sets 10 LAQ 3# 2 sets 10 Standing with RW 3# 10 x SL hip flex,ext and abd,marching and HS curl Leg press 40# 2 sets 10. 20# SL 10 x BIL , assist to start on RT Green tband HS curl 2 sets 10     10/24/23 NuStep L 5 x 6 min Goals  Stairs heavy UE use come SGA alt pattern 2 rails Sit to stand from elevated surface with compensation due to weakness LAQ 2lb 2x10  Eccentrics with RLE Gait 50 feet No AD CGA   Decrease heel strike and step length   10/22/23 Nustep L 5, 6 min Standing reaching outside base of support  Side steps at mat able  Leg press 40lb 2x10   SL 20lb x10 each assist need with RLE   Gait 69ft CGA cues for erect posture   Then 30ft LAQ 2lb 2x10  Eccentrics with RLE  10/16/23:  Nustep L 5, 6 min Lat rows(airex in seat) 20# 2 x 10 Rows standing 15# 2 x 10 Knee ext B 5# 2 x 10 Knee flexion 25# 2 x 10, 35# 1 x 10 Sit to stand from elevated mat 7 x Chest presses for, side, side 10 x each blue mediball   10/10/23 Nustep L 5 Green tband HS curls 2 sets 10 3# LAQ 2 sets 10 3# standing with RW 10 x BIL-marching,hip flex,ext ,abd and HS curl HHA 3# marching fwd and back 15 feet 2 x each HHA 3# side stepping 15 feet 2 x each Leg Press 20# BIL 10 x seat on 9, SL 10 x each 20 #  left leg seat on 11, RT needs assist to start mvmt. 20# BIL 10 x seat 8     PATIENT EDUCATION:  Education details: exam findings, POC Person educated: Patient Education method: Explanation Education comprehension: verbalized understanding, returned demonstration, and needs further education  HOME EXERCISE PROGRAM: Educ on getting ankle wts, adjustable up to 5 is rec as we  used 3 #- okayed doing seated and standing ex with RW  ASSESSMENT:  CLINICAL IMPRESSION: pt arrived feeling so-so. Progressed leg press and HS curl wt. STS from lower height stilling needed some assist but better eccentric. Fatigued with resisted gait but good balance. Pt continues to benefit form skilled therapy to address deficiets   Patient is a 63 y.o. M who was seen today for physical therapy evaluation and treatment for S14.109S (ICD-10-CM) - Spinal cord injury, cervical region, sequela (HCC) Pt tolerated a initial progression to therapy well. RLE is significantly weaker than R. Pt had log legs and a relatively short torso making it difficult to stand from low surfaces in conjunction with weak LE. Decrease coordination with side steps. No pain during session. Limited ROM with RLE during isolated exercises.  Will make every effort to improve function and work towards goals.   OBJECTIVE IMPAIRMENTS: Abnormal gait,  decreased activity tolerance, decreased balance, decreased coordination, decreased knowledge of use of DME, decreased mobility, difficulty walking, decreased strength, postural dysfunction, and pain.   ACTIVITY LIMITATIONS: standing, squatting, stairs, transfers, bed mobility, toileting, dressing, and locomotion level  PARTICIPATION LIMITATIONS: shopping, community activity, and yard work  PERSONAL FACTORS: Age, Behavior pattern, Education, Fitness, Past/current experiences, Profession, Social background, and Time since onset of injury/illness/exacerbation are also affecting patient's functional outcome.   REHAB POTENTIAL: Good  CLINICAL DECISION MAKING: Evolving/moderate complexity  EVALUATION COMPLEXITY: Moderate   GOALS: Goals reviewed with patient? No  SHORT TERM GOALS: Target date: 10/24/2023   Will be compliant with appropriate progressive HEP  Baseline: Goal status: 10/08/23 MET  2.  Will be able to consistently perform functional STS tranfers from surfaces 20 inches or less  Baseline:  Goal status:10/08/23 on going  10/31/23 progressing still difficult for pt, 11/05/23 progressing still difficult for pt uses his UE, ongoing 11/19/23   3.  Will be able to ambulate at least 384ft with quad cane no more than light MinA  Baseline:  Goal status: 10/08/23 progressing  and 10/31/23  4.  MMT to be at least 3+/5 in all tested groups R LE  Baseline:  Goal status: ongoing 10/24/23  and 10/31/23, Progressing 11/19/23    LONG TERM GOALS: Target date: 12/05/2023      R LE MMT to be at least 4-/5 in all tested groups  Baseline:  Goal status: ongoing 10/24/23, Ongoing 11/05/23, Ongoing 11/19/23  2.  Will score at least 18 on DGI with no device to show reduced fall risk  Baseline:  Goal status: INITIAL  3.  Will be able to reciprocally ascend/descend steps without difficulty and U rail  Baseline:  Goal status: Ongoing 10/24/23  4.  Will be able to ambulate community distances with  LRAD, Mod(I) to allow for improved community access and participating in social activities  Baseline:  Goal status: Progressing 11/05/23  5.  Will be compliant with appropriate gym based exercise program by time of DC  Baseline:  Goal status: reports going to gym some with his son , using bicycle., Progressing 11/05/23 goes with his son    PLAN:  PT FREQUENCY: 2x/week  PT DURATION: 12 weeks  PLANNED INTERVENTIONS: 97750- Physical Performance Testing, 97110-Therapeutic exercises, 97530- Therapeutic activity, 97112- Neuromuscular re-education, 97535- Self Care, [id number]- Manual therapy, and [id number]- Gait training  PLAN FOR NEXT SESSION: progress func strength, gait and balance. Jon Shamaine Mulkern PTA  11/21/23 8:44 AM

## 2023-11-22 DIAGNOSIS — M25471 Effusion, right ankle: Secondary | ICD-10-CM | POA: Diagnosis not present

## 2023-11-22 DIAGNOSIS — I739 Peripheral vascular disease, unspecified: Secondary | ICD-10-CM | POA: Diagnosis not present

## 2023-11-22 DIAGNOSIS — N186 End stage renal disease: Secondary | ICD-10-CM | POA: Diagnosis not present

## 2023-11-26 ENCOUNTER — Ambulatory Visit: Admitting: Physician Assistant

## 2023-11-26 ENCOUNTER — Encounter: Payer: Self-pay | Admitting: Physician Assistant

## 2023-11-26 ENCOUNTER — Ambulatory Visit: Admitting: Physical Therapy

## 2023-11-26 DIAGNOSIS — S9001XA Contusion of right ankle, initial encounter: Secondary | ICD-10-CM | POA: Diagnosis not present

## 2023-11-26 NOTE — Progress Notes (Signed)
 Office Visit Note   Patient: [name].           Date of Birth: 1960-07-02           MRN: [id number] Visit Date: 11/26/2023              Requested by: [name], MD [address] [place],  [place] [address] PCP: [name], MD  Chief Complaint  Patient presents with   Right Ankle - Follow-up      HPI: 63 y/o male who was seen in Urgent care for a   1 week history of persistent right ankle pain over the lateral aspect.  Has had some intermittent swelling.  No fall, trauma. He ambulates with a rolling walker.   Past medical history includes: ESRD, PAD with calcified vessels.  He has a referral to see Vascular for PAD work up.  There is a plan in place for angiogram with possible intervention.  On 05/25/23 he suffered a ground level fall and sustained a C4 burst fracture.  S/P C4 Corpectomy followed by anterior fusion C3-C5.  He has weakness on the right side post injury.               He was sent home with a treatment plan for conservative treatment to include: Tylenol , compression socks, elevation.   He was diagnosed with a contusion of the right ankle.   Conservative treatment to include: Tylenol , compression socks, elevation.   Assessment & Plan: Visit Diagnoses: No diagnosis found.  Plan: Continue conservative treatment to include Compression, elevation.  He will likely need the compression more after angiogram if he has improved inflow.    Follow-Up Instructions: Return if symptoms worsen or fail to improve.   Ortho Exam  Patient is alert, oriented, no adenopathy, well-dressed, normal affect, normal respiratory effort. No edema, motor intact, skin warm.  Non tender to palpation over the lateral malleolus today, NTTP over the peroneal tendon. Intact ankle ROM. NTTP forefoot or toes. No ischemic skin changes. Doppler signals intact B LE DP/PT.     Imaging: No results found. No images are attached to the encounter.  Labs: Lab Results   Component Value Date   HGBA1C 4.8 11/15/2020   HGBA1C 5.3 05/28/2019   HGBA1C 4.8 10/02/2015   ESRSEDRATE 53 (H) 03/12/2023   CRP 5.2 03/12/2023   LABURIC 4.0 11/15/2020   REPTSTATUS 06/22/2023 FINAL 06/18/2023   GRAMSTAIN  06/18/2023    ABUNDANT WBC PRESENT, PREDOMINANTLY PMN NO ORGANISMS SEEN    CULT  06/18/2023    NO GROWTH 3 DAYS Performed at [facility], [address], [place], [name]      Lab Results  Component Value Date   ALBUMIN  2.7 (L) 07/15/2023   ALBUMIN  2.8 (L) 07/12/2023   ALBUMIN  2.8 (L) 07/10/2023    Lab Results  Component Value Date   MG 2.1 08/04/2015   MG 1.7 08/02/2015   No results found for: VD25OH  No results found for: PREALBUMIN    Latest Ref Rng & Units 07/15/2023    9:15 AM 07/12/2023   11:00 AM 07/10/2023    1:51 PM  CBC EXTENDED  WBC 4.0 - 10.5 K/uL 5.2  5.9  5.7   RBC 4.22 - 5.81 MIL/uL 3.34  3.52  3.33   Hemoglobin 13.0 - 17.0 g/dL 9.4  89.9  9.3   HCT 60.9 - 52.0 % 29.5  31.0  29.7   Platelets 150 - 400  K/uL 243  255  237      There is no height or weight on file to calculate BMI.  Orders:  No orders of the defined types were placed in this encounter.  No orders of the defined types were placed in this encounter.    Procedures: No procedures performed  Clinical Data: No additional findings.  ROS:  All other systems negative, except as noted in the HPI. Review of Systems  Objective: Vital Signs: There were no vitals taken for this visit.  Specialty Comments:  No specialty comments available.  PMFS History: Patient Active Problem List   Diagnosis Date Noted   Right knee buckling 11/15/2023   Primary osteoarthritis of right knee 11/15/2023   Abnormality of gait 11/15/2023   Primary osteoarthritis of left knee 10/03/2023   Right hip pain 07/14/2023   Coping style affecting medical condition 06/05/2023   Spinal cord injury, cervical region, sequela 05/30/2023   Cervical vertebral fracture  (HCC) 05/25/2023   History of GI bleed 05/25/2023   C4 cervical fracture (HCC) 05/25/2023   Duodenitis    Gastritis and gastroduodenitis    Ischemic colitis    Bright red blood per rectum    ABLA (acute blood loss anemia)    Gastric ulcer    Duodenal ulcer    Diverticulosis of colon without hemorrhage    GI bleed 09/17/2021   Acute upper GI bleeding 09/11/2021   Mucosal abnormality of esophagus    Mucosal abnormality of stomach    Mucosal abnormality of duodenum    Upper GI bleed    Left hip pain 11/16/2020   Type 2 diabetes mellitus with other specified complication (HCC) 11/14/2020   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/14/2020   Primary osteoarthritis of left hip 10/07/2020   Status post reverse total arthroplasty of right shoulder 08/24/2020   Diverticulitis 03/08/2020   ESRD (end stage renal disease) (HCC) 03/08/2020   Polyneuropathy in diseases classified elsewhere 03/08/2020   Pure hypercholesterolemia 03/08/2020   Sacral back pain 03/08/2020   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 11/10/2019   Acute gastric ulcer with hemorrhage    Acute GI bleeding 07/31/2019   Hypotension of hemodialysis 07/31/2019   Diverticulitis large intestine 07/31/2019   Mass of soft tissue of shoulder 05/26/2019   Awaiting organ transplant status 03/18/2019   Finding of cocaine in blood 02/25/2019   Anaphylactic shock, unspecified, sequela 11/21/2018   Hypercalcemia 10/28/2018   Diarrhea, unspecified 08/08/2018   Hyperkalemia 03/12/2018   Renal cell carcinoma of left kidney (HCC) 12/24/2017   Fluid overload, unspecified 07/06/2017   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   Other specified disorders of kidney and ureter 11/22/2016   Secondary hyperparathyroidism of renal origin 10/23/2016   Headache disorder 08/21/2016   TIA (transient ischemic attack)    Essential hypertension    Seizure (HCC)    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    Lung nodule     Diverticulitis of cecum 07/21/2015   Diverticulitis of large intestine without perforation or abscess without bleeding    Right lower quadrant pain    Obstructive sleep apnea 02/08/2015   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   Diverticulosis 07/13/2014   Lower GI bleed 07/12/2014   Refusal of blood transfusions as patient is Jehovah's Witness    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   HLD (hyperlipidemia)    PRES (posterior reversible encephalopathy syndrome)    History of CVA (  cerebrovascular accident) 05/23/2014   Anemia of chronic disease 05/23/2014   GERD (gastroesophageal reflux disease) 12/09/2012   Gastrointestinal bleeding 11/12/2012   Melena 11/12/2012   Other psychoactive substance dependence, uncomplicated (HCC) 10/08/2012   Iron deficiency anemia, unspecified 06/10/2012   Moderate protein-calorie malnutrition 07/29/2009   Coagulation defect, unspecified 08/29/2006   Type 2 diabetes mellitus with diabetic peripheral angiopathy without gangrene (HCC) 08/29/2006   Hypertensive chronic kidney disease with stage 5 chronic kidney disease or end stage renal disease (HCC) 08/02/2006   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    Parathyroid  abnormality    ectopic parathyroid  gland   Presence of arteriovenous fistula for hemodialysis, primary    RUE PER PT RLE   Refusal of blood product    NO  WHOLE BLOOD PROUCTS   Renal cell carcinoma (HCC)    s/p hand assisted laparoscopic bilateral nephrectomies 11/29/17, + RCC left   Secondary hyperparathyroidism    Seizures (HCC)    one episode in past, due to elevated Potassium 08/02/20- at least 4 years ago   Sleep apnea    doesn't use CPAP anymore since weight loss   Stroke (HCC)    no residual    Family History  Problem Relation Age of Onset   Diabetes Father    Stroke Father    Hypertension Father    Uterine cancer Mother    Lupus Sister    Stroke Sister    Hypertension Sister    Anuerysm Brother        brain   Colon cancer Neg Hx    Esophageal cancer Neg Hx    Stomach cancer Neg Hx    Pancreatic cancer Neg Hx    Liver disease Neg Hx    Colon polyps Neg Hx    Rectal cancer Neg Hx     Past Surgical History:  Procedure Laterality Date   ANTERIOR CERVICAL CORPECTOMY N/A 05/25/2023   Procedure: ANTERIOR CERVICAL CORPECTOMY CERVICAL FOUR, CERVICAL THREE - CERVICAL FIVE FUSION;  Surgeon: [name] Shove, MD;  Location: [facility] OR;  Service: Neurosurgery;  Laterality: N/A;   AV FISTULA PLACEMENT Right    right arm   BIOPSY  08/01/2019   Procedure: BIOPSY;  Surgeon: Kristie Lamprey, MD;  Location: [facility] ENDOSCOPY;  Service: Endoscopy;;   BIOPSY  11/18/2020   Procedure: BIOPSY;  Surgeon: Stacia Glendia BRAVO, MD;  Location: [facility] ENDOSCOPY;  Service: Gastroenterology;;   BIOPSY  09/13/2021   Procedure: BIOPSY;  Surgeon: Wilhelmenia Aloha Raddle., MD;  Location: [facility] ENDOSCOPY;  Service: Gastroenterology;;   CATARACT EXTRACTION W/ INTRAOCULAR LENS  IMPLANT, BILATERAL     COLON SURGERY     COLONOSCOPY N/A 08/04/2015   Procedure: COLONOSCOPY;  Surgeon: Gordy CHRISTELLA Starch, MD;  Location: [facility] ENDOSCOPY;  Service: Endoscopy;  Laterality:  N/A;   COLONOSCOPY  2017   JMP@ [facility]-good prep-mass -recall 1 yr   COLONOSCOPY N/A 09/13/2021   Procedure: COLONOSCOPY;  Surgeon: Mansouraty, Aloha Raddle., MD;  Location: [facility] ENDOSCOPY;  Service: Gastroenterology;  Laterality:  N/A;   COLONOSCOPY WITH PROPOFOL  N/A 11/25/2020   Procedure: COLONOSCOPY WITH PROPOFOL ;  Surgeon: Federico Rosario BROCKS, MD;  Location: [facility] ENDOSCOPY;  Service: Gastroenterology;  Laterality: N/A;   COLONOSCOPY WITH PROPOFOL  N/A 09/23/2021   Procedure: COLONOSCOPY WITH PROPOFOL ;  Surgeon: San Sandor GAILS, DO;  Location: [facility] ENDOSCOPY;  Service: Gastroenterology;  Laterality: N/A;   ENTEROSCOPY N/A 09/23/2021   Procedure: ENTEROSCOPY;  Surgeon: San Sandor GAILS, DO;  Location: [facility] ENDOSCOPY;  Service: Gastroenterology;  Laterality: N/A;  Will place order for video capsule study as we may opt to place it during procedure   ESOPHAGOGASTRODUODENOSCOPY N/A 08/01/2019   Procedure: ESOPHAGOGASTRODUODENOSCOPY (EGD);  Surgeon: Kristie Lamprey, MD;  Location: [facility] ENDOSCOPY;  Service: Endoscopy;  Laterality: N/A;   ESOPHAGOGASTRODUODENOSCOPY N/A 11/18/2020   Procedure: ESOPHAGOGASTRODUODENOSCOPY (EGD);  Surgeon: Stacia Glendia BRAVO, MD;  Location: [facility] ENDOSCOPY;  Service: Gastroenterology;  Laterality: N/A;   ESOPHAGOGASTRODUODENOSCOPY (EGD) WITH PROPOFOL  N/A 08/04/2019   Procedure: ESOPHAGOGASTRODUODENOSCOPY (EGD) WITH PROPOFOL ;  Surgeon: Leigh Elspeth SQUIBB, MD;  Location: [facility] ENDOSCOPY;  Service: Gastroenterology;  Laterality: N/A;   ESOPHAGOGASTRODUODENOSCOPY (EGD) WITH PROPOFOL  N/A 09/13/2021   Procedure: ESOPHAGOGASTRODUODENOSCOPY (EGD) WITH PROPOFOL ;  Surgeon: Wilhelmenia Aloha Raddle., MD;  Location: [facility] ENDOSCOPY;  Service: Gastroenterology;  Laterality: N/A;   GIVENS CAPSULE STUDY N/A 09/23/2021   Procedure: GIVENS CAPSULE STUDY;  Surgeon: San Sandor GAILS, DO;  Location: [facility] ENDOSCOPY;  Service: Gastroenterology;  Laterality: N/A;   graft left arm Left    for dialysis x 2. Removed   HOT HEMOSTASIS  11/18/2020   Procedure: HOT HEMOSTASIS (ARGON PLASMA COAGULATION/BICAP);  Surgeon: Stacia Glendia BRAVO, MD;  Location: [facility] ENDOSCOPY;  Service: Gastroenterology;;   HOT HEMOSTASIS N/A 09/23/2021   Procedure: HOT  HEMOSTASIS (ARGON PLASMA COAGULATION/BICAP);  Surgeon: San Sandor GAILS, DO;  Location: [facility] ENDOSCOPY;  Service: Gastroenterology;  Laterality: N/A;   INSERTION OF DIALYSIS CATHETER     Rt chest   LAPAROSCOPIC RIGHT COLECTOMY N/A 08/05/2015   Procedure: LAPAROSCOPIC RIGHT COLECTOMY- ASCENDING;  Surgeon: Jina Nephew, MD;  Location: [facility] OR;  Service: General;  Laterality: N/A;   MASS EXCISION Left 05/28/2019   Procedure: EXCISION SOFT TISSUE MASS LEFT SHOULDER;  Surgeon: Eletha Boas, MD;  Location: [facility] ORS;  Service: General;  Laterality: Left;   NEPHRECTOMY Bilateral    PARATHYROIDECTOMY N/A 06/12/2016   Procedure: TOTAL PARATHYROIDECTOMY WITH AUTOTRANSPLANTATION TO LEFT FOREARM;  Surgeon: Boas Eletha, MD;  Location: [facility] OR;  Service: General;  Laterality: N/A;   PARATHYROIDECTOMY N/A 10/23/2016   Procedure: PARATHYROIDECTOMY;  Surgeon: Eletha Boas, MD;  Location: [facility] OR;  Service: General;  Laterality: N/A;   REVERSE SHOULDER ARTHROPLASTY Right 08/24/2020   Procedure: REVERSE SHOULDER ARTHROPLASTY;  Surgeon: Cristy Bonner DASEN, MD;  Location: [facility] OR;  Service: Orthopedics;  Laterality: Right;   REVISON OF ARTERIOVENOUS FISTULA Right 07/16/2017   Procedure: REVISION OF ARTERIOVENOUS FISTULA  Right ARM;  Surgeon: Sheree Penne Bruckner, MD;  Location: [facility] OR;  Service: Vascular;  Laterality: Right;   TOTAL HIP ARTHROPLASTY Left 11/14/2020   Procedure: LEFT TOTAL HIP ARTHROPLASTY ANTERIOR APPROACH;  Surgeon: [name] Kay HERO, MD;  Location: [facility] OR;  Service: Orthopedics;  Laterality: Left;   UPPER GASTROINTESTINAL ENDOSCOPY  2021   @ [facility]   Social History   Occupational History  Occupation: retired    Associate Professor: SELF EMPLOYED  Tobacco Use   Smoking status: Former    Current packs/day: 0.00    Types: Cigarettes    Quit date: 06/19/1990    Years since quitting: 33.4   Smokeless tobacco: Never   Tobacco comments:    rarely cigar  Vaping Use   Vaping status: Never Used  Substance and Sexual  Activity   Alcohol use: Not Currently    Alcohol/week: 1.0 standard drink of alcohol    Types: 1 Cans of beer per week    Comment: occasional beer   Drug use: Not Currently    Types: Marijuana    Comment: Last use several yrs ago   Sexual activity: Not Currently

## 2023-11-27 ENCOUNTER — Ambulatory Visit: Admitting: Physical Therapy

## 2023-11-28 ENCOUNTER — Ambulatory Visit: Admitting: Physical Therapy

## 2023-11-28 ENCOUNTER — Encounter: Payer: Self-pay | Admitting: Physical Therapy

## 2023-11-28 DIAGNOSIS — M6281 Muscle weakness (generalized): Secondary | ICD-10-CM

## 2023-11-28 DIAGNOSIS — R2681 Unsteadiness on feet: Secondary | ICD-10-CM

## 2023-11-28 DIAGNOSIS — R262 Difficulty in walking, not elsewhere classified: Secondary | ICD-10-CM

## 2023-11-28 DIAGNOSIS — R296 Repeated falls: Secondary | ICD-10-CM

## 2023-11-28 NOTE — Therapy (Signed)
 OUTPATIENT PHYSICAL THERAPY LOWER EXTREMITY TREATMENT    Patient Name: Carl Lundberg Sr. MRN: [id number] DOB:10-Jul-1960, 63 y.o., male Today's Date: 11/28/2023  END OF SESSION:  PT End of Session - 11/28/23 1542     Visit Number 14    Date for Recertification  12/05/23    PT Start Time 1545    PT Stop Time 1630    PT Time Calculation (min) 45 min    Activity Tolerance Patient tolerated treatment well    Behavior During Therapy [facility] for tasks assessed/performed           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    Parathyroid  abnormality    ectopic parathyroid  gland   Presence of arteriovenous fistula for hemodialysis, primary    RUE PER PT RLE   Refusal of blood product    NO WHOLE BLOOD PROUCTS   Renal cell carcinoma (HCC)    s/p hand assisted laparoscopic bilateral nephrectomies 11/29/17, + RCC left   Secondary hyperparathyroidism    Seizures (HCC)    one episode in past, due to elevated Potassium 08/02/20- at least 4 years ago   Sleep apnea    doesn't use CPAP anymore since weight loss   Stroke [facility])    no residual   Past Surgical History:  Procedure Laterality Date   ANTERIOR CERVICAL CORPECTOMY N/A 05/25/2023   Procedure: ANTERIOR CERVICAL CORPECTOMY CERVICAL FOUR, CERVICAL THREE - CERVICAL FIVE FUSION;  Surgeon: [name] Shove, MD;  Location: [facility] OR;  Service: Neurosurgery;  Laterality: N/A;   AV FISTULA PLACEMENT Right    right arm    BIOPSY  08/01/2019   Procedure: BIOPSY;  Surgeon: Kristie Lamprey, MD;  Location: [facility] ENDOSCOPY;  Service: Endoscopy;;   BIOPSY  11/18/2020   Procedure: BIOPSY;  Surgeon: Stacia Glendia BRAVO, MD;  Location: [facility] ENDOSCOPY;  Service: Gastroenterology;;   BIOPSY  09/13/2021   Procedure: BIOPSY;  Surgeon: Wilhelmenia Aloha Raddle., MD;  Location: [facility] ENDOSCOPY;  Service: Gastroenterology;;   CATARACT EXTRACTION W/ INTRAOCULAR LENS  IMPLANT, BILATERAL     COLON SURGERY     COLONOSCOPY N/A 08/04/2015   Procedure: COLONOSCOPY;  Surgeon: Gordy [name] Starch, MD;  Location: [facility] ENDOSCOPY;  Service: Endoscopy;  Laterality: N/A;   COLONOSCOPY  2017   JMP@ [facility]-good prep-mass -recall 1 yr   COLONOSCOPY N/A 09/13/2021   Procedure: COLONOSCOPY;  Surgeon: Mansouraty, Aloha Raddle., MD;  Location: [facility] ENDOSCOPY;  Service: Gastroenterology;  Laterality: N/A;   COLONOSCOPY WITH PROPOFOL  N/A 11/25/2020   Procedure: COLONOSCOPY WITH PROPOFOL ;  Surgeon: Federico Rosario BROCKS, MD;  Location: [facility] ENDOSCOPY;  Service: Gastroenterology;  Laterality: N/A;   COLONOSCOPY WITH PROPOFOL  N/A 09/23/2021   Procedure: COLONOSCOPY WITH PROPOFOL ;  Surgeon: San Sandor GAILS, DO;  Location: [facility] ENDOSCOPY;  Service: Gastroenterology;  Laterality:  N/A;   ENTEROSCOPY N/A 09/23/2021   Procedure: ENTEROSCOPY;  Surgeon: San Sandor GAILS, DO;  Location: [facility] ENDOSCOPY;  Service: Gastroenterology;  Laterality: N/A;  Will place order for video capsule study as we may opt to place it during procedure   ESOPHAGOGASTRODUODENOSCOPY N/A 08/01/2019   Procedure: ESOPHAGOGASTRODUODENOSCOPY (EGD);  Surgeon: Kristie Lamprey, MD;  Location: [facility] ENDOSCOPY;  Service: Endoscopy;  Laterality: N/A;   ESOPHAGOGASTRODUODENOSCOPY N/A 11/18/2020   Procedure: ESOPHAGOGASTRODUODENOSCOPY (EGD);  Surgeon: Stacia Glendia BRAVO, MD;  Location: [facility] ENDOSCOPY;  Service: Gastroenterology;  Laterality: N/A;   ESOPHAGOGASTRODUODENOSCOPY (EGD) WITH PROPOFOL  N/A 08/04/2019   Procedure:  ESOPHAGOGASTRODUODENOSCOPY (EGD) WITH PROPOFOL ;  Surgeon: Leigh Elspeth SQUIBB, MD;  Location: [facility] ENDOSCOPY;  Service: Gastroenterology;  Laterality: N/A;   ESOPHAGOGASTRODUODENOSCOPY (EGD) WITH PROPOFOL  N/A 09/13/2021   Procedure: ESOPHAGOGASTRODUODENOSCOPY (EGD) WITH PROPOFOL ;  Surgeon: Wilhelmenia Aloha Raddle., MD;  Location: [name] ENDOSCOPY;  Service: Gastroenterology;  Laterality: N/A;   GIVENS CAPSULE STUDY N/A 09/23/2021   Procedure: GIVENS CAPSULE STUDY;  Surgeon: San Sandor GAILS, DO;  Location: [facility] ENDOSCOPY;  Service: Gastroenterology;  Laterality: N/A;   graft left arm Left    for dialysis x 2. Removed   HOT HEMOSTASIS  11/18/2020   Procedure: HOT HEMOSTASIS (ARGON PLASMA COAGULATION/BICAP);  Surgeon: Stacia Glendia BRAVO, MD;  Location: [facility] ENDOSCOPY;  Service: Gastroenterology;;   HOT HEMOSTASIS N/A 09/23/2021   Procedure: HOT HEMOSTASIS (ARGON PLASMA COAGULATION/BICAP);  Surgeon: San Sandor GAILS, DO;  Location: [facility] ENDOSCOPY;  Service: Gastroenterology;  Laterality: N/A;   INSERTION OF DIALYSIS CATHETER     Rt chest   LAPAROSCOPIC RIGHT COLECTOMY N/A 08/05/2015   Procedure: LAPAROSCOPIC RIGHT COLECTOMY- ASCENDING;  Surgeon: Jina Nephew, MD;  Location: [facility] OR;  Service: General;  Laterality: N/A;   MASS EXCISION Left 05/28/2019   Procedure: EXCISION SOFT TISSUE MASS LEFT SHOULDER;  Surgeon: Eletha Boas, MD;  Location: [facility] ORS;  Service: General;  Laterality: Left;   NEPHRECTOMY Bilateral    PARATHYROIDECTOMY N/A 06/12/2016   Procedure: TOTAL PARATHYROIDECTOMY WITH AUTOTRANSPLANTATION TO LEFT FOREARM;  Surgeon: Boas Eletha, MD;  Location: [facility] OR;  Service: General;  Laterality: N/A;   PARATHYROIDECTOMY N/A 10/23/2016   Procedure: PARATHYROIDECTOMY;  Surgeon: Eletha Boas, MD;  Location: [facility] OR;  Service: General;  Laterality: N/A;   REVERSE SHOULDER ARTHROPLASTY Right 08/24/2020   Procedure: REVERSE SHOULDER ARTHROPLASTY;  Surgeon: Cristy Bonner DASEN, MD;  Location: [facility] OR;  Service: Orthopedics;   Laterality: Right;   REVISON OF ARTERIOVENOUS FISTULA Right 07/16/2017   Procedure: REVISION OF ARTERIOVENOUS FISTULA  Right ARM;  Surgeon: Sheree Penne Bruckner, MD;  Location: [facility] OR;  Service: Vascular;  Laterality: Right;   TOTAL HIP ARTHROPLASTY Left 11/14/2020   Procedure: LEFT TOTAL HIP ARTHROPLASTY ANTERIOR APPROACH;  Surgeon: [name] Kay HERO, MD;  Location: [facility] OR;  Service: Orthopedics;  Laterality: Left;   UPPER GASTROINTESTINAL ENDOSCOPY  2021   @ [facility]   Patient Active Problem List   Diagnosis Date Noted   Right knee buckling 11/15/2023   Primary osteoarthritis of right knee 11/15/2023   Abnormality of gait 11/15/2023   Primary osteoarthritis of left knee 10/03/2023   Right hip pain 07/14/2023   Coping style affecting medical condition 06/05/2023   Spinal cord injury, cervical region, sequela 05/30/2023   Cervical vertebral fracture (HCC) 05/25/2023   History of GI bleed 05/25/2023   C4 cervical fracture (HCC) 05/25/2023   Duodenitis    Gastritis and gastroduodenitis    Ischemic colitis    Bright red blood per rectum  ABLA (acute blood loss anemia)    Gastric ulcer    Duodenal ulcer    Diverticulosis of colon without hemorrhage    GI bleed 09/17/2021   Acute upper GI bleeding 09/11/2021   Mucosal abnormality of esophagus    Mucosal abnormality of stomach    Mucosal abnormality of duodenum    Upper GI bleed    Left hip pain 11/16/2020   Type 2 diabetes mellitus with other specified complication (HCC) 11/14/2020   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/14/2020   Primary osteoarthritis of left hip 10/07/2020   Status post reverse total arthroplasty of right shoulder 08/24/2020   Diverticulitis 03/08/2020   ESRD (end stage renal disease) (HCC) 03/08/2020   Polyneuropathy in diseases classified elsewhere 03/08/2020   Pure hypercholesterolemia 03/08/2020   Sacral back pain 03/08/2020   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 11/10/2019   Acute gastric ulcer with  hemorrhage    Acute GI bleeding 07/31/2019   Hypotension of hemodialysis 07/31/2019   Diverticulitis large intestine 07/31/2019   Mass of soft tissue of shoulder 05/26/2019   Awaiting organ transplant status 03/18/2019   Finding of cocaine in blood 02/25/2019   Anaphylactic shock, unspecified, sequela 11/21/2018   Hypercalcemia 10/28/2018   Diarrhea, unspecified 08/08/2018   Hyperkalemia 03/12/2018   Renal cell carcinoma of left kidney (HCC) 12/24/2017   Fluid overload, unspecified 07/06/2017   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   Other specified disorders of kidney and ureter 11/22/2016   Secondary hyperparathyroidism of renal origin 10/23/2016   Headache disorder 08/21/2016   TIA (transient ischemic attack)    Essential hypertension    Seizure (HCC)    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    Lung nodule    Diverticulitis of cecum 07/21/2015   Diverticulitis of large intestine without perforation or abscess without bleeding    Right lower quadrant pain    Obstructive sleep apnea 02/08/2015   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   Diverticulosis 07/13/2014   Lower GI bleed 07/12/2014   Refusal of blood transfusions as patient is Jehovah's Witness    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   HLD (hyperlipidemia)    PRES (posterior reversible encephalopathy syndrome)    History of CVA (cerebrovascular accident) 05/23/2014   Anemia of chronic disease 05/23/2014   GERD (gastroesophageal reflux disease) 12/09/2012   Gastrointestinal bleeding 11/12/2012   Melena 11/12/2012   Other psychoactive substance dependence, uncomplicated (HCC) 10/08/2012   Iron deficiency anemia, unspecified 06/10/2012   Moderate protein-calorie malnutrition 07/29/2009   Coagulation defect, unspecified 08/29/2006   Type 2 diabetes mellitus with diabetic peripheral angiopathy without gangrene (HCC) 08/29/2006    Hypertensive chronic kidney disease with stage 5 chronic kidney disease or end stage renal disease (HCC) 08/02/2006    PCP: [name] MD   REFERRING PROVIDER: Debby Fidela CROME, NP  REFERRING DIAG:  Diagnosis  S14.109S (ICD-10-CM) - Spinal cord injury, cervical region, sequela (HCC)    THERAPY DIAG:  Difficulty in walking, not elsewhere classified  Muscle weakness (generalized)  Unsteadiness on feet  Repeated falls  Rationale for Evaluation and Treatment: Rehabilitation  ONSET DATE:  ACDF 05/25/23  SUBJECTIVE:   SUBJECTIVE STATEMENT: Wore out, been up since 4 o'clock this morning  Getting a stint in his R leg on the 27th  PERTINENT HISTORY: See above   ACDF C3-C5 fusion, anterior cervical corpectomy C3-C4  PAIN:  Are you having pain? 4/10 Back  PRECAUTIONS:  Precautions:  Precautions Precautions: Fall, Cervical, Other (comment) Recall of Precautions/Restrictions: Impaired Precaution/Restrictions Comments: Verbally reviewed cervical precautions. Required Braces or Orthoses: Cervical Brace Cervical Brace: Soft collar, At all times Restrictions Weight Bearing Restrictions Per Provider Order: No General: Pain: No c/o pain.   RED FLAGS: None   WEIGHT BEARING RESTRICTIONS: No  FALLS:  Has patient fallen in last 6 months? Yes. Number of falls 3- one that led to surgery, then 2 after getting home from the hospital  LIVING ENVIRONMENT: Lives with: lives with their son Lives in: House/apartment Stairs: ground floor apt  Has following equipment at home: Single point cane, Environmental consultant - 2 wheeled, and Environmental consultant - 4 wheeled  OCCUPATION: retired- used to pressure wash   PLOF: Independent, Independent with basic ADLs, Independent with gait, and Independent with transfers  PATIENT GOALS: get back to normal ability, drive convertible again   NEXT MD VISIT: Dr. Cornelio 11/15/23  OBJECTIVE:  Note: Objective measures were completed at Evaluation unless otherwise  noted.  DIAGNOSTIC FINDINGS:    CLINICAL DATA:  63 year old male with altered mental status.   EXAM: CT HEAD WITHOUT CONTRAST   TECHNIQUE: Contiguous axial images were obtained from the base of the skull through the vertex without intravenous contrast.   RADIATION DOSE REDUCTION: This exam was performed according to the departmental dose-optimization program which includes automated exposure control, adjustment of the mA and/or kV according to patient size and/or use of iterative reconstruction technique.   COMPARISON:  Brain MRI and head CT 05/25/2023.   FINDINGS: Brain:   Unusual extensive, diffuse pachymeningeal calcification which has been present since at least 2013. Stable cerebral volume. No midline shift, ventriculomegaly, mass effect, evidence of mass lesion, intracranial hemorrhage or evidence of cortically based acute infarction. Small area of chronic encephalomalacia in the medial left occipital lobe redemonstrated. Stable gray-white matter differentiation throughout the brain.   Vascular: Chronic severe pachymeningeal calcification.   Skull: Appears stable.  No acute osseous abnormality identified.   Sinuses/Orbits: Continued left middle ear and mastoid opacification. Other Visualized paranasal sinuses and mastoids are stable and well aerated.   Other: Anterior left scalp soft tissue injury has regressed since earlier in the month. Orbits soft tissues appears stable.   IMPRESSION: 1. No acute intracranial abnormality. 2. 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 3. Ongoing left middle ear and mastoid opacification. No acute osseous abnormality identified. 4. Regressed scalp soft tissue injury since 05/25/2023.  CLINICAL DATA:  Chronic bilateral knee pain and swelling.   EXAM: RIGHT KNEE - COMPLETE 4+ VIEW; LEFT KNEE - COMPLETE 4+ VIEW   COMPARISON:  Right knee radiographs dated  12/26/2021.   FINDINGS: Right knee:   No acute fracture or dislocation. There is a small to moderate-sized joint effusion. Moderate tricompartmental degenerative changes with joint space narrowing and marginal osteophytosis. Vascular calcifications are noted.   Left knee:   No acute fracture or dislocation. There is a small joint effusion. Moderate tricompartmental degenerative changes with joint space narrowing and marginal osteophytosis. Vascular calcifications are noted.   IMPRESSION: 1. No acute osseous abnormality. 2. 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  Narrative & Impression  CLINICAL DATA:  Chronic bilateral knee pain and swelling.   EXAM: RIGHT KNEE - COMPLETE 4+ VIEW; LEFT KNEE - COMPLETE 4+ VIEW   COMPARISON:  Right knee radiographs dated 12/26/2021.   FINDINGS: Right knee:   No acute fracture or dislocation. There is  a small to moderate-sized joint effusion. Moderate tricompartmental degenerative changes with joint space narrowing and marginal osteophytosis. Vascular calcifications are noted.   Left knee:   No acute fracture or dislocation. There is a small joint effusion. Moderate tricompartmental degenerative changes with joint space narrowing and marginal osteophytosis. Vascular calcifications are noted.   IMPRESSION: 1. No acute osseous abnormality. 2. 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    Narrative & Impression CLINICAL DATA:  Right hip pain.   EXAM: DG HIP (WITH OR WITHOUT PELVIS) 2-3V RIGHT   COMPARISON:  None Available.   FINDINGS: No acute fracture or dislocation. The bones are osteopenic. Cystic changes of the right femoral head and neck measure up to 3.7 cm. Orthopedic referral is advised. Mild arthritic changes of the right hip. Total left hip arthroplasty. The  soft tissues are unremarkable. Vascular calcifications.   IMPRESSION: 1. No acute fracture or dislocation. 2. Cystic changes of the right femoral head and neck. Orthopedic referral is advised.  Lower Venous DVT Study   Patient Name:  Carl Feger Sr.  Date of Exam:   06/10/2023  Medical Rec #: [id number]            Accession #:    [id number]  Date of Birth: 11-25-1960             Patient Gender: M  Patient Age:   63 years  Exam Location:  [facility]  Procedure:      VAS US  LOWER EXTREMITY VENOUS (DVT)  Referring Phys: PRENTICE COMPTON    - PATIENT SURVEYS:  PSFS: THE PATIENT SPECIFIC FUNCTIONAL SCALE  Place score of 0-10 (0 = unable to perform activity and 10 = able to perform activity at the same level as before injury or problem)  Activity Date: eval 09/12/23 11/07/23 11/28/23  Getting in and out of car (anything lower than 20 inches)  0 0   2. Balance  3 5   3. Walking without a device  2 5   4.  Social activities  0 0   Total Score 1.25 2.5     Total Score = Sum of activity scores/number of activities  Minimally Detectable Change: 3 points (for single activity); 2 points (for average score)  [employer] [facility] (nd). The Patient Specific Functional Scale . Retrieved from [email or web address]   COGNITION: Overall cognitive status: Within functional limits for tasks assessed     SENSATION: Hx of neuropathy even before fall/surgery     LOWER EXTREMITY MMT:  MMT Right eval Left eval Right 10/24/23 Left 10/24/23 RT/Left 10/31/23 RT/L 11/05/23 RT/L 11/19/23 RT/L 11/28/23  Hip flexion 3- 3+ 3 3+ 3/3+ 3+/4 4-/4+ 4-/4+  Hip extension          Hip abduction          Hip adduction          Hip internal rotation          Hip external rotation          Knee flexion 3- 4+ 4- 5 4/5 4/5 5/5   Knee extension 3- 5 3- 5 3-/5 3-/5 3/5 3p!/5  Ankle dorsiflexion 4 5 5 5       Ankle plantarflexion          Ankle  inversion          Ankle eversion           (Blank rows = not tested)  GAIT: Distance walked: in clinic distances  Assistive device utilized: Environmental consultant - 2 wheeled Level of assistance: Modified independence Comments: slow but steady with RW                                                                                                                                TREATMENT DATE:  11/28/23 Nustep L 5 7 min LE only S2S form elevated mat with UE 2x10 Leg press 50lb 2x10 LAQ 2lb 2x10 HS curls green 2x10   11/21/23 30# resited gait fwd 5 x min A Nustep L 5 LE only Leg pres 40lb 10x 50# 2 sets 10 HS curls 35# 10 x BIL. LLE 25# 10 x, RT LE 20# 10 x Knee Ext 5# 10 x LLE, unable to do RT STS 25in 2 sets 5 no UE, CG-min A with cuing for eccentric lowering LAQ RT LE 2 sets 10  107/25 NuStep L 5 x 6 min Goals Leg pres 40lb 3x10 6in step ups 2 rails x10 each 4in lateral step ups x10 each 2 rails LAQ 2x10  HS curls green x10   11/07/23 NuStep L 5 x 7 min Sit to stand 2x10 form elevated surface with UE use Leg press 40lb 2x10 6in step ups with 2 rails x10 Heel raises 2x10 LAQ 2lb 2x10  11/05/23 NuStep L5 x 6 min Goals  Sit to stands w/ UE from elevated mat 2x10 6in step ups with UE assit x10 on L, x5 on R  10/31/23 Nustep L 5 STGs assessed including MMT Leg Press 40# 2 sets 10, SL 10 x BIL 20#, AA with RT to start 6 inch step up with UE support 10 x BIL CG- min A 4 inch step down with UE 10 xBIL CG-min A STS no hands CG-min A with cuing 28 in , could not do lower 10 x  10/29/23 Nustep L 5 BIL ankle red tband 4 way 10x 2 sets  Green tband HS curl 2 sets 10 LAQ 3# 2 sets 10 Standing with RW 3# 10 x SL hip flex,ext and abd,marching and HS curl Leg press 40# 2 sets 10. 20# SL 10 x BIL , assist to start on RT Green tband HS curl 2 sets 10   PATIENT EDUCATION:  Education details: exam findings, POC Person educated: Patient Education method:  Explanation Education comprehension: verbalized understanding, returned demonstration, and needs further education  HOME EXERCISE PROGRAM: Educ on getting ankle wts, adjustable up to 5 is rec as we used 3 #- okayed doing seated and standing ex with RW  ASSESSMENT:  CLINICAL IMPRESSION: pt arrived feeling tired.  Fatigue noted during session.  He continues to struggle with sit to stands from regular chair height.  Some knee crepitus noted on leg press.  Unable to achieve full TKE with RLE while doing LAQ. Pt continues to benefit form skilled therapy to address deficiets   Patient is a 63  y.o. [name] who was seen today for physical therapy evaluation and treatment for S14.109S (ICD-10-CM) - Spinal cord injury, cervical region, sequela (HCC) Pt tolerated a initial progression to therapy well. RLE is significantly weaker than R. Pt had log legs and a relatively short torso making it difficult to stand from low surfaces in conjunction with weak LE. Decrease coordination with side steps. No pain during session. Limited ROM with RLE during isolated exercises.  Will make every effort to improve function and work towards goals.   OBJECTIVE IMPAIRMENTS: Abnormal gait, decreased activity tolerance, decreased balance, decreased coordination, decreased knowledge of use of DME, decreased mobility, difficulty walking, decreased strength, postural dysfunction, and pain.   ACTIVITY LIMITATIONS: standing, squatting, stairs, transfers, bed mobility, toileting, dressing, and locomotion level  PARTICIPATION LIMITATIONS: shopping, community activity, and yard work  PERSONAL FACTORS: Age, Behavior pattern, Education, Fitness, Past/current experiences, Profession, Social background, and Time since onset of injury/illness/exacerbation are also affecting patient's functional outcome.   REHAB POTENTIAL: Good  CLINICAL DECISION MAKING: Evolving/moderate complexity  EVALUATION COMPLEXITY: Moderate   GOALS: Goals reviewed  with patient? No  SHORT TERM GOALS: Target date: 10/24/2023   Will be compliant with appropriate progressive HEP  Baseline: Goal status: 10/08/23 MET  2.  Will be able to consistently perform functional STS tranfers from surfaces 20 inches or less  Baseline:  Goal status:10/08/23 on going  10/31/23 progressing still difficult for pt, 11/05/23 progressing still difficult for pt uses his UE, ongoing 11/19/23, Ongoing 11/28/23   3.  Will be able to ambulate at least 359ft with quad cane no more than light MinA  Baseline:  Goal status: 10/08/23 progressing  and 10/31/23  4.  MMT to be at least 3+/5 in all tested groups R LE  Baseline:  Goal status: ongoing 10/24/23  and 10/31/23, Progressing 11/19/23, Progressing 11/28/23    LONG TERM GOALS: Target date: 12/05/2023      R LE MMT to be at least 4-/5 in all tested groups  Baseline:  Goal status: ongoing 10/24/23, Ongoing 11/05/23, Ongoing 11/19/23  2.  Will score at least 18 on DGI with no device to show reduced fall risk  Baseline:  Goal status: INITIAL  3.  Will be able to reciprocally ascend/descend steps without difficulty and U rail  Baseline:  Goal status: Ongoing 10/24/23, 11/28/23  4.  Will be able to ambulate community distances with LRAD, Mod(I) to allow for improved community access and participating in social activities  Baseline:  Goal status: Progressing 11/05/23  5.  Will be compliant with appropriate gym based exercise program by time of DC  Baseline:  Goal status: reports going to gym some with his son , using bicycle., Progressing 11/05/23 goes with his son    PLAN:  PT FREQUENCY: 2x/week  PT DURATION: 12 weeks  PLANNED INTERVENTIONS: 97750- Physical Performance Testing, 97110-Therapeutic exercises, 97530- Therapeutic activity, 97112- Neuromuscular re-education, 97535- Self Care, [id number]- Manual therapy, and [id number]- Gait training  PLAN FOR NEXT SESSION: progress func strength, gait and balance. Tanda Sorrow, PA   11/28/23 3:46 PM

## 2023-12-02 NOTE — Progress Notes (Signed)
 Carl Curls Sr.                                          MRN: [id number]   12/02/2023   The [facility] Quality Team Specialist reviewed this patient medical record for the purposes of chart review for care gap closure. The following were reviewed: chart review for care gap closure-glycemic status assessment.    [facility] Quality Team

## 2023-12-03 NOTE — Progress Notes (Signed)
 [name]                                          MRN: [id number]   12/03/2023   The [facility] Quality Team Specialist reviewed this patient medical record for the purposes of chart review for care gap closure. The following were reviewed: chart review for care gap closure-glycemic status assessment.    [facility] Quality Team

## 2023-12-06 ENCOUNTER — Ambulatory Visit: Admitting: Physical Therapy

## 2023-12-09 ENCOUNTER — Other Ambulatory Visit: Payer: Self-pay

## 2023-12-09 ENCOUNTER — Encounter (HOSPITAL_COMMUNITY)
Admission: RE | Disposition: A | Discharge status: Home / Self Care | Payer: Self-pay | Source: Home / Self Care | Attending: Vascular Surgery

## 2023-12-09 ENCOUNTER — Ambulatory Visit (HOSPITAL_COMMUNITY)
Admission: RE | Admit: 2023-12-09 | Discharge: 2023-12-09 | Disposition: A | Attending: Vascular Surgery | Admitting: Vascular Surgery

## 2023-12-09 DIAGNOSIS — N186 End stage renal disease: Secondary | ICD-10-CM | POA: Diagnosis not present

## 2023-12-09 DIAGNOSIS — Z79899 Other long term (current) drug therapy: Secondary | ICD-10-CM | POA: Diagnosis not present

## 2023-12-09 DIAGNOSIS — Z87891 Personal history of nicotine dependence: Secondary | ICD-10-CM | POA: Insufficient documentation

## 2023-12-09 DIAGNOSIS — I120 Hypertensive chronic kidney disease with stage 5 chronic kidney disease or end stage renal disease: Secondary | ICD-10-CM | POA: Diagnosis not present

## 2023-12-09 DIAGNOSIS — Z992 Dependence on renal dialysis: Secondary | ICD-10-CM | POA: Insufficient documentation

## 2023-12-09 DIAGNOSIS — I70202 Unspecified atherosclerosis of native arteries of extremities, left leg: Secondary | ICD-10-CM | POA: Diagnosis not present

## 2023-12-09 DIAGNOSIS — I70221 Atherosclerosis of native arteries of extremities with rest pain, right leg: Secondary | ICD-10-CM

## 2023-12-09 DIAGNOSIS — I70421 Atherosclerosis of autologous vein bypass graft(s) of the extremities with rest pain, right leg: Secondary | ICD-10-CM | POA: Insufficient documentation

## 2023-12-09 HISTORY — PX: LOWER EXTREMITY INTERVENTION: CATH118252

## 2023-12-09 HISTORY — PX: LOWER EXTREMITY ANGIOGRAPHY: CATH118251

## 2023-12-09 HISTORY — PX: ABDOMINAL AORTOGRAM W/LOWER EXTREMITY: CATH118223

## 2023-12-09 LAB — POCT I-STAT, CHEM 8
BUN: 90 mg/dL — ABNORMAL HIGH (ref 8–23)
Calcium, Ion: 0.96 mmol/L — ABNORMAL LOW (ref 1.15–1.40)
Chloride: 103 mmol/L (ref 98–111)
Creatinine, Ser: 11.4 mg/dL — ABNORMAL HIGH (ref 0.61–1.24)
Glucose, Bld: 76 mg/dL (ref 70–99)
HCT: 32 % — ABNORMAL LOW (ref 39.0–52.0)
Hemoglobin: 10.9 g/dL — ABNORMAL LOW (ref 13.0–17.0)
Potassium: 5.3 mmol/L — ABNORMAL HIGH (ref 3.5–5.1)
Sodium: 137 mmol/L (ref 135–145)
TCO2: 28 mmol/L (ref 22–32)

## 2023-12-09 LAB — NO BLOOD PRODUCTS

## 2023-12-09 SURGERY — ABDOMINAL AORTOGRAM W/LOWER EXTREMITY
Anesthesia: LOCAL

## 2023-12-09 MED ORDER — ASPIRIN 81 MG PO CHEW
CHEWABLE_TABLET | ORAL | Status: AC
  Filled 2023-12-09: qty 1

## 2023-12-09 MED ORDER — CLOPIDOGREL BISULFATE 300 MG PO TABS
ORAL_TABLET | ORAL | Status: AC
  Filled 2023-12-09: qty 1

## 2023-12-09 MED ORDER — ONDANSETRON HCL 4 MG/2ML IJ SOLN: 4.0000 mg | Freq: Four times a day (QID) | INTRAMUSCULAR | Status: DC | PRN

## 2023-12-09 MED ORDER — SODIUM CHLORIDE 0.9 % IV SOLN: 250.0000 mL | INTRAVENOUS | Status: DC | PRN

## 2023-12-09 MED ORDER — MORPHINE SULFATE (PF) 2 MG/ML IV SOLN: 2.0000 mg | INTRAVENOUS | Status: DC | PRN

## 2023-12-09 MED ORDER — MIDAZOLAM HCL 2 MG/2ML IJ SOLN
INTRAMUSCULAR | Status: AC
  Filled 2023-12-09: qty 2

## 2023-12-09 MED ORDER — FENTANYL CITRATE (PF) 100 MCG/2ML IJ SOLN
INTRAMUSCULAR | Status: DC | PRN
  Administered 2023-12-09: 50 ug via INTRAVENOUS

## 2023-12-09 MED ORDER — CLOPIDOGREL BISULFATE 300 MG PO TABS
ORAL_TABLET | ORAL | Status: DC | PRN
  Administered 2023-12-09: 300 mg via ORAL

## 2023-12-09 MED ORDER — MIDAZOLAM HCL (PF) 2 MG/2ML IJ SOLN
INTRAMUSCULAR | Status: DC | PRN
  Administered 2023-12-09: 1 mg via INTRAVENOUS

## 2023-12-09 MED ORDER — FENTANYL CITRATE (PF) 100 MCG/2ML IJ SOLN
INTRAMUSCULAR | Status: AC
  Filled 2023-12-09: qty 2

## 2023-12-09 MED ORDER — HEPARIN SODIUM (PORCINE) 1000 UNIT/ML IJ SOLN
INTRAMUSCULAR | Status: DC | PRN
  Administered 2023-12-09: 8000 [IU] via INTRAVENOUS

## 2023-12-09 MED ORDER — SODIUM CHLORIDE 0.9% FLUSH: 3.0000 mL | INTRAVENOUS | Status: DC | PRN

## 2023-12-09 MED ORDER — HYDRALAZINE HCL 20 MG/ML IJ SOLN: 5.0000 mg | INTRAMUSCULAR | Status: DC | PRN

## 2023-12-09 MED ORDER — OXYCODONE HCL 5 MG PO TABS: 5.0000 mg | ORAL_TABLET | ORAL | Status: DC | PRN

## 2023-12-09 MED ORDER — LIDOCAINE HCL (PF) 1 % IJ SOLN
INTRAMUSCULAR | Status: DC | PRN
  Administered 2023-12-09: 10 mL via INTRADERMAL

## 2023-12-09 MED ORDER — ASPIRIN 81 MG PO CHEW
CHEWABLE_TABLET | ORAL | Status: DC | PRN
  Administered 2023-12-09: 81 mg via ORAL

## 2023-12-09 MED ORDER — SODIUM CHLORIDE 0.9% FLUSH: 3.0000 mL | Freq: Two times a day (BID) | INTRAVENOUS | Status: DC

## 2023-12-09 MED ORDER — CLOPIDOGREL BISULFATE 75 MG PO TABS: 75.0000 mg | ORAL_TABLET | Freq: Every day | ORAL | 11 refills | Status: AC

## 2023-12-09 MED ORDER — LABETALOL HCL 5 MG/ML IV SOLN: 10.0000 mg | INTRAVENOUS | Status: DC | PRN

## 2023-12-09 SURGICAL SUPPLY — 20 items
BALLOON MUSTANG 6X150X135 (BALLOONS) IMPLANT
CATH OMNI FLUSH 5F 65CM (CATHETERS) IMPLANT
CATH QUICKCROSS SUPP .035X90CM (MICROCATHETER) IMPLANT
CATH SHOCKWAVE E8 5X80 (CATHETERS) IMPLANT
CLOSURE MYNX CONTROL 6F/7F (Vascular Products) IMPLANT
DCB RANGER 5.0X150 150 (BALLOONS) IMPLANT
GLIDEWIRE ANGLED SS 035X260CM (WIRE) IMPLANT
KIT ENCORE 26 ADVANTAGE (KITS) IMPLANT
KIT MICROPUNCTURE NIT STIFF (SHEATH) IMPLANT
KIT SINGLE USE MANIFOLD (KITS) IMPLANT
KIT SYRINGE INJ CVI SPIKEX1 (MISCELLANEOUS) IMPLANT
PACK CARDIAC CATHETERIZATION (CUSTOM PROCEDURE TRAY) IMPLANT
SET ATX-X65L (MISCELLANEOUS) IMPLANT
SHEATH CATAPULT 6FR 45 (SHEATH) IMPLANT
SHEATH PINNACLE 5F 10CM (SHEATH) IMPLANT
SHEATH PINNACLE 6F 10CM (SHEATH) IMPLANT
SHEATH PROBE COVER 6X72 (BAG) IMPLANT
STENT ELUVIA 6X150X130 (Permanent Stent) IMPLANT
WIRE BENTSON .035X145CM (WIRE) IMPLANT
WIRE SPARTACORE .014X300CM (WIRE) IMPLANT

## 2023-12-09 NOTE — Op Note (Signed)
 Patient name: [name] Sr. 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 Sheree, MD Procedure Performed: 1.  Percutaneous ultrasound-guided cannulation and Mynx device closure left common femoral artery 2.  Catheter selection of aorta and aortogram with bilateral lower extremity angiography 3.  Shockwave lithotripsy with 5 x 60 mm balloon (400 pulses) right SFA and popliteal arteries and drug-coated balloon angioplasty with 5 mm Ranger 4.  Stent right SFA and popliteal arteries with 6 x 150 mm Eluvia postdilated with 6 mm balloon 5.  Moderate sedation with fentanyl  and Versed  for 69 minutes  Indications: 63 year old male with end-stage renal disease now with right lower extremity rest pain with monophasic waveforms and severely depressed toe pressure.  He is indicated for angiography with possible intervention.  Findings: Aorta and iliac segments are large and free of flow-limiting stenosis.  Bilateral renal arteries do not fill consistent with dialysis.  Bilateral common femoral arteries are calcified without flow-limiting stenosis.  Right SFA has approximately 30% stenosis in the proximal segment distally there is a 70% stenosis and at the knee there is a 90% stenosis.  Runoff is dominant via the posterior tibial artery the peroneal artery is also patent the anterior tibial artery occludes after the takeoff.  We initially attempted treatment of the right SFA and popliteal segment with lithotripsy and drug-coated balloon but due to recalcitrant lesions we ultimately stented with drug-eluting stent postdilated with 6 mm balloon and a completion there was brisk flow through the previously areas of stenosis.  On the left side approximately the SFA is diseased there is a tight 80% stenosis at the adductor canal.  There is three-vessel  runoff however the anterior tibial artery is diseased.   Procedure:  The patient was identified in the holding area and taken to room 8.  The patient was then placed supine on the table and prepped and draped in the usual sterile fashion.  A time out was called.  Ultrasound was used to evaluate the left common femoral artery.  There was posterior plaque distally leading into the SFA.  We anesthetized above this and then cannulated with micropuncture needle followed by wire and a sheath.  On ultrasound images saved the permanent record.  Concomitantly we administered fentanyl  and Versed  as moderate sedation as vital signs were monitored throughout the case.  We placed a Bentson wire followed by 5 French sheath and Omni catheter to the level of L1 performed aortogram followed by pelvic angiography.  We then crossed the bifurcation with a catheter and perform right lower extremity angiography and with the above findings we elected for intervention.  We then placed the long 6 French sheath into the right SFA and the patient was fully heparinized.  Using a Glidewire for cross catheter we traversed the stenosed areas in the SFA and confirmed intraluminal access and placed an 014 wire.  We then began with shockwave lithotripsy with a 5 x 60 mm balloon performed for 400 pulses.  At completion we then performed drug-coated balloon angioplasty.  Unfortunately the lesion appeared recalcitrant and there was possibly dissection distally and we elected for stenting with 6 x 150 mm Eluvia postdilated with 6 mm balloon.  Completion demonstrated no residual stenosis in the lesions.  Satisfied with this we exchanged for a short 6 French sheath and perform left lower extremity angiography.  We then deployed a minx device.  He tolerated the procedure without any complication.  Contrast: 110cc  Yuki Purves C. Sheree, MD [facility] Office: [phone] Pager: [phone]

## 2023-12-09 NOTE — Discharge Instructions (Signed)
 Femoral Site Care This sheet gives you information about how to care for yourself after your procedure. Your health care provider may also give you more specific instructions. If you have problems or questions, contact your health care provider. What can I expect after the procedure?  After the procedure, it is common to have: Bruising that usually fades within 1-2 weeks. Tenderness at the site. Follow these instructions at home: Wound care Follow instructions from your health care provider about how to take care of your insertion site. Make sure you: Wash your hands with soap and water  before you change your bandage (dressing). If soap and water  are not available, use hand sanitizer. Remove your dressing as told by your health care provider. 24 hours Do not take baths, swim, or use a hot tub until your health care provider approves. You may shower 24-48 hours after the procedure or as told by your health care provider. Gently wash the site with plain soap and water . Pat the area dry with a clean towel. Do not rub the site. This may cause bleeding. Do not apply powder or lotion to the site. Keep the site clean and dry. Check your femoral site every day for signs of infection. Check for: Redness, swelling, or pain. Fluid or blood. Warmth. Pus or a bad smell. Activity For the first 2-3 days after your procedure, or as long as directed: Avoid climbing stairs as much as possible. Do not squat. Do not lift anything that is heavier than 10 lb (4.5 kg), or the limit that you are told, until your health care provider says that it is safe. For 5 days Rest as directed. Avoid sitting for a long time without moving. Get up to take short walks every 1-2 hours. Do not drive for 24 hours if you were given a medicine to help you relax (sedative). General instructions Take over-the-counter and prescription medicines only as told by your health care provider. Keep all follow-up visits as told by your  health care provider. This is important. Contact a health care provider if you have: A fever or chills. You have redness, swelling, or pain around your insertion site. Get help right away if: The catheter insertion area swells very fast. You pass out. You suddenly start to sweat or your skin gets clammy. The catheter insertion area is bleeding, and the bleeding does not stop when you hold steady pressure on the area. The area near or just beyond the catheter insertion site becomes pale, cool, tingly, or numb. These symptoms may represent a serious problem that is an emergency. Do not wait to see if the symptoms will go away. Get medical help right away. Call your local emergency services (911 in the [place] Do not drive yourself to the hospital. Summary After the procedure, it is common to have bruising that usually fades within 1-2 weeks. Check your femoral site every day for signs of infection. Do not lift anything that is heavier than 10 lb (4.5 kg), or the limit that you are told, until your health care provider says that it is safe. This information is not intended to replace advice given to you by your health care provider. Make sure you discuss any questions you have with your health care provider. Document Revised: 02/11/2017 Document Reviewed: 02/11/2017 Elsevier Patient Education  2020 [employer].

## 2023-12-09 NOTE — Interval H&P Note (Signed)
 History and Physical Interval Note:  12/09/2023 7:20 AM  [name] Sr.  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  The various methods of treatment have been discussed with the patient and family. After consideration of risks, benefits and other options for treatment, the patient has consented to  Procedure(s): ABDOMINAL AORTOGRAM W/LOWER EXTREMITY (N/A) Lower Extremity Angiography (N/A) LOWER EXTREMITY INTERVENTION (N/A) as a surgical intervention.  The patient's history has been reviewed, patient examined, no change in status, stable for surgery.  I have reviewed the patient's chart and labs.  Questions were answered to the patient's satisfaction.     [name]

## 2023-12-09 NOTE — Progress Notes (Signed)
 Patient ambulated int he halls. Left groin site dressing clean, dry, and intact. No bleeding or hematoma noted. Assisted patient with getting dressed for discharge. No changes noted.

## 2023-12-10 ENCOUNTER — Encounter (HOSPITAL_COMMUNITY): Payer: Self-pay | Admitting: Vascular Surgery

## 2023-12-10 ENCOUNTER — Ambulatory Visit: Admitting: Physical Therapy

## 2023-12-12 ENCOUNTER — Ambulatory Visit: Admitting: Physical Therapy

## 2023-12-13 ENCOUNTER — Other Ambulatory Visit: Payer: Self-pay

## 2023-12-13 DIAGNOSIS — M79606 Pain in leg, unspecified: Secondary | ICD-10-CM

## 2023-12-16 ENCOUNTER — Encounter: Payer: Self-pay | Admitting: Radiology

## 2023-12-17 ENCOUNTER — Ambulatory Visit: Attending: Family Medicine

## 2023-12-17 DIAGNOSIS — M25561 Pain in right knee: Secondary | ICD-10-CM | POA: Diagnosis present

## 2023-12-17 DIAGNOSIS — R2681 Unsteadiness on feet: Secondary | ICD-10-CM | POA: Insufficient documentation

## 2023-12-17 DIAGNOSIS — R2689 Other abnormalities of gait and mobility: Secondary | ICD-10-CM | POA: Insufficient documentation

## 2023-12-17 DIAGNOSIS — M25511 Pain in right shoulder: Secondary | ICD-10-CM | POA: Insufficient documentation

## 2023-12-17 DIAGNOSIS — R296 Repeated falls: Secondary | ICD-10-CM | POA: Diagnosis present

## 2023-12-17 DIAGNOSIS — R262 Difficulty in walking, not elsewhere classified: Secondary | ICD-10-CM | POA: Insufficient documentation

## 2023-12-17 DIAGNOSIS — G8929 Other chronic pain: Secondary | ICD-10-CM | POA: Diagnosis present

## 2023-12-17 DIAGNOSIS — M25611 Stiffness of right shoulder, not elsewhere classified: Secondary | ICD-10-CM | POA: Insufficient documentation

## 2023-12-17 DIAGNOSIS — M6281 Muscle weakness (generalized): Secondary | ICD-10-CM | POA: Diagnosis present

## 2023-12-17 NOTE — Therapy (Signed)
 OUTPATIENT PHYSICAL THERAPY LOWER EXTREMITY TREATMENT    Patient Name: Carl Alverio Sr. MRN: [id number] DOB:09-May-1960, 63 y.o., male Today's Date: 12/17/2023  END OF SESSION:  PT End of Session - 12/17/23 1145     Visit Number 15    Date for Recertification  02/11/24    PT Start Time 1145    PT Stop Time 1223    PT Time Calculation (min) 38 min    Activity Tolerance Patient tolerated treatment well    Behavior During Therapy [facility] for tasks assessed/performed           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    Parathyroid  abnormality    ectopic parathyroid  gland   Presence of arteriovenous fistula for hemodialysis, primary    RUE PER PT RLE   Refusal of blood product    NO WHOLE BLOOD PROUCTS   Renal cell carcinoma (HCC)    s/p hand assisted laparoscopic bilateral nephrectomies 11/29/17, + RCC left   Secondary hyperparathyroidism    Seizures (HCC)    one episode in past, due to elevated Potassium 08/02/20- at least 4 years ago   Sleep apnea    doesn't use CPAP anymore since weight loss   Stroke [facility])    no residual   Past Surgical History:  Procedure Laterality Date   ABDOMINAL AORTOGRAM W/LOWER EXTREMITY N/A 12/09/2023   Procedure: ABDOMINAL AORTOGRAM W/LOWER EXTREMITY;  Surgeon: Sheree Penne Bruckner, MD;  Location: [facility] INVASIVE CV LAB;  Service: Cardiovascular;  Laterality: N/A;   ANTERIOR CERVICAL CORPECTOMY N/A 05/25/2023    Procedure: ANTERIOR CERVICAL CORPECTOMY CERVICAL FOUR, CERVICAL THREE - CERVICAL FIVE FUSION;  Surgeon: [name] Shove, MD;  Location: [facility] OR;  Service: Neurosurgery;  Laterality: N/A;   AV FISTULA PLACEMENT Right    right arm   BIOPSY  08/01/2019   Procedure: BIOPSY;  Surgeon: Kristie Lamprey, MD;  Location: [facility] ENDOSCOPY;  Service: Endoscopy;;   BIOPSY  11/18/2020   Procedure: BIOPSY;  Surgeon: Stacia Glendia BRAVO, MD;  Location: [facility] ENDOSCOPY;  Service: Gastroenterology;;   BIOPSY  09/13/2021   Procedure: BIOPSY;  Surgeon: Wilhelmenia Aloha Raddle., MD;  Location: [facility] ENDOSCOPY;  Service: Gastroenterology;;   CATARACT EXTRACTION W/ INTRAOCULAR LENS  IMPLANT, BILATERAL     COLON SURGERY     COLONOSCOPY N/A 08/04/2015   Procedure: COLONOSCOPY;  Surgeon: Gordy CHRISTELLA Starch, MD;  Location: [facility] ENDOSCOPY;  Service: Endoscopy;  Laterality: N/A;   COLONOSCOPY  2017   JMP@ [facility]-good prep-mass -recall 1 yr   COLONOSCOPY N/A 09/13/2021   Procedure: COLONOSCOPY;  Surgeon: Mansouraty, Aloha Raddle., MD;  Location: [facility] ENDOSCOPY;  Service: Gastroenterology;  Laterality: N/A;   COLONOSCOPY WITH PROPOFOL  N/A 11/25/2020   Procedure: COLONOSCOPY WITH PROPOFOL ;  Surgeon: Federico Rosario BROCKS, MD;  Location: [facility] ENDOSCOPY;  Service: Gastroenterology;  Laterality: N/A;   COLONOSCOPY WITH PROPOFOL  N/A 09/23/2021   Procedure: COLONOSCOPY WITH PROPOFOL ;  Surgeon: San Sandor GAILS, DO;  Location: [facility] ENDOSCOPY;  Service: Gastroenterology;  Laterality: N/A;   ENTEROSCOPY N/A 09/23/2021   Procedure: ENTEROSCOPY;  Surgeon: San Sandor GAILS, DO;  Location: [facility] ENDOSCOPY;  Service: Gastroenterology;  Laterality: N/A;  Will place order for video capsule study as we may opt to place it during procedure   ESOPHAGOGASTRODUODENOSCOPY N/A 08/01/2019   Procedure: ESOPHAGOGASTRODUODENOSCOPY (EGD);  Surgeon: Kristie Lamprey, MD;  Location: [facility] ENDOSCOPY;  Service: Endoscopy;  Laterality: N/A;   ESOPHAGOGASTRODUODENOSCOPY N/A 11/18/2020   Procedure:  ESOPHAGOGASTRODUODENOSCOPY (EGD);  Surgeon: Stacia Glendia BRAVO, MD;  Location: [facility] ENDOSCOPY;  Service: Gastroenterology;  Laterality: N/A;   ESOPHAGOGASTRODUODENOSCOPY (EGD) WITH PROPOFOL  N/A 08/04/2019   Procedure: ESOPHAGOGASTRODUODENOSCOPY (EGD) WITH PROPOFOL ;  Surgeon: Leigh Elspeth SQUIBB, MD;  Location: [facility] ENDOSCOPY;  Service: Gastroenterology;  Laterality: N/A;   ESOPHAGOGASTRODUODENOSCOPY (EGD) WITH PROPOFOL  N/A 09/13/2021   Procedure: ESOPHAGOGASTRODUODENOSCOPY (EGD) WITH PROPOFOL ;  Surgeon: Wilhelmenia Aloha Raddle., MD;  Location: [facility] ENDOSCOPY;  Service: Gastroenterology;  Laterality: N/A;   GIVENS CAPSULE STUDY N/A 09/23/2021   Procedure: GIVENS CAPSULE STUDY;  Surgeon: San Sandor GAILS, DO;  Location: [facility] ENDOSCOPY;  Service: Gastroenterology;  Laterality: N/A;   graft left arm Left    for dialysis x 2. Removed   HOT HEMOSTASIS  11/18/2020   Procedure: HOT HEMOSTASIS (ARGON PLASMA COAGULATION/BICAP);  Surgeon: Stacia Glendia BRAVO, MD;  Location: [name] ENDOSCOPY;  Service: Gastroenterology;;   HOT HEMOSTASIS N/A 09/23/2021   Procedure: HOT HEMOSTASIS (ARGON PLASMA COAGULATION/BICAP);  Surgeon: San Sandor GAILS, DO;  Location: [facility] ENDOSCOPY;  Service: Gastroenterology;  Laterality: N/A;   INSERTION OF DIALYSIS CATHETER     Rt chest   LAPAROSCOPIC RIGHT COLECTOMY N/A 08/05/2015   Procedure: LAPAROSCOPIC RIGHT COLECTOMY- ASCENDING;  Surgeon: Jina Nephew, MD;  Location: [facility] OR;  Service: General;  Laterality: N/A;   LOWER EXTREMITY ANGIOGRAPHY N/A 12/09/2023   Procedure: Lower Extremity Angiography;  Surgeon: Sheree Penne Bruckner, MD;  Location: [facility] INVASIVE CV LAB;  Service: Cardiovascular;  Laterality: N/A;   LOWER EXTREMITY INTERVENTION N/A 12/09/2023   Procedure: LOWER EXTREMITY INTERVENTION;  Surgeon: Sheree Penne Bruckner, MD;  Location: [facility] INVASIVE CV LAB;  Service: Cardiovascular;  Laterality: N/A;   MASS EXCISION Left 05/28/2019   Procedure: EXCISION SOFT TISSUE MASS LEFT  SHOULDER;  Surgeon: Eletha Boas, MD;  Location: [facility] ORS;  Service: General;  Laterality: Left;   NEPHRECTOMY Bilateral    PARATHYROIDECTOMY N/A 06/12/2016   Procedure: TOTAL PARATHYROIDECTOMY WITH AUTOTRANSPLANTATION TO LEFT FOREARM;  Surgeon: Boas Eletha, MD;  Location: [facility] OR;  Service: General;  Laterality: N/A;   PARATHYROIDECTOMY N/A 10/23/2016   Procedure: PARATHYROIDECTOMY;  Surgeon: Eletha Boas, MD;  Location: [facility] OR;  Service: General;  Laterality: N/A;   REVERSE SHOULDER ARTHROPLASTY Right 08/24/2020   Procedure: REVERSE SHOULDER ARTHROPLASTY;  Surgeon: Cristy Bonner DASEN, MD;  Location: [facility] OR;  Service: Orthopedics;  Laterality: Right;   REVISON OF ARTERIOVENOUS FISTULA Right 07/16/2017   Procedure: REVISION OF ARTERIOVENOUS FISTULA  Right ARM;  Surgeon: Sheree Penne Bruckner, MD;  Location: [facility] OR;  Service: Vascular;  Laterality: Right;   TOTAL HIP ARTHROPLASTY Left 11/14/2020   Procedure: LEFT TOTAL HIP ARTHROPLASTY ANTERIOR APPROACH;  Surgeon: [name] Kay HERO, MD;  Location: [facility] OR;  Service: Orthopedics;  Laterality: Left;   UPPER GASTROINTESTINAL ENDOSCOPY  2021   @ [facility]   Patient Active Problem List   Diagnosis Date Noted  Right knee buckling 11/15/2023   Primary osteoarthritis of right knee 11/15/2023   Abnormality of gait 11/15/2023   Primary osteoarthritis of left knee 10/03/2023   Right hip pain 07/14/2023   Coping style affecting medical condition 06/05/2023   Spinal cord injury, cervical region, sequela 05/30/2023   Cervical vertebral fracture (HCC) 05/25/2023   History of GI bleed 05/25/2023   C4 cervical fracture (HCC) 05/25/2023   Duodenitis    Gastritis and gastroduodenitis    Ischemic colitis    Bright red blood per rectum    ABLA (acute blood loss anemia)    Gastric ulcer    Duodenal ulcer    Diverticulosis of colon without hemorrhage    GI bleed 09/17/2021   Acute upper GI bleeding 09/11/2021   Mucosal abnormality of esophagus    Mucosal abnormality of  stomach    Mucosal abnormality of duodenum    Upper GI bleed    Left hip pain 11/16/2020   Type 2 diabetes mellitus with other specified complication (HCC) 11/14/2020   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/14/2020   Primary osteoarthritis of left hip 10/07/2020   Status post reverse total arthroplasty of right shoulder 08/24/2020   Diverticulitis 03/08/2020   ESRD (end stage renal disease) (HCC) 03/08/2020   Polyneuropathy in diseases classified elsewhere 03/08/2020   Pure hypercholesterolemia 03/08/2020   Sacral back pain 03/08/2020   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 11/10/2019   Acute gastric ulcer with hemorrhage    Acute GI bleeding 07/31/2019   Hypotension of hemodialysis 07/31/2019   Diverticulitis large intestine 07/31/2019   Mass of soft tissue of shoulder 05/26/2019   Awaiting organ transplant status 03/18/2019   Finding of cocaine in blood 02/25/2019   Anaphylactic shock, unspecified, sequela 11/21/2018   Hypercalcemia 10/28/2018   Diarrhea, unspecified 08/08/2018   Hyperkalemia 03/12/2018   Renal cell carcinoma of left kidney (HCC) 12/24/2017   Fluid overload, unspecified 07/06/2017   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   Other specified disorders of kidney and ureter 11/22/2016   Secondary hyperparathyroidism of renal origin 10/23/2016   Headache disorder 08/21/2016   TIA (transient ischemic attack)    Essential hypertension    Seizure (HCC)    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    Lung nodule    Diverticulitis of cecum 07/21/2015   Diverticulitis of large intestine without perforation or abscess without bleeding    Right lower quadrant pain    Obstructive sleep apnea 02/08/2015   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   Diverticulosis 07/13/2014   Lower GI bleed 07/12/2014   Refusal of blood transfusions as patient is Jehovah's Witness    Parotid mass    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 06/13/2014   HLD (hyperlipidemia)    PRES (posterior reversible encephalopathy syndrome)    History of CVA (cerebrovascular accident) 05/23/2014   Anemia of chronic disease 05/23/2014   GERD (gastroesophageal reflux disease) 12/09/2012   Gastrointestinal bleeding 11/12/2012   Melena 11/12/2012   Other psychoactive substance dependence, uncomplicated (HCC) 10/08/2012   Iron deficiency anemia, unspecified 06/10/2012   Moderate protein-calorie malnutrition 07/29/2009   Coagulation defect, unspecified 08/29/2006   Type 2 diabetes mellitus with diabetic peripheral angiopathy without gangrene (HCC) 08/29/2006   Hypertensive chronic kidney disease with stage 5 chronic kidney disease or end stage renal disease (HCC) 08/02/2006    PCP: [name] MD   REFERRING PROVIDER: Debby Fidela CROME, NP  REFERRING DIAG:  Diagnosis  S14.109S (ICD-10-CM) -  Spinal cord injury, cervical region, sequela (HCC)    THERAPY DIAG:  Difficulty in walking, not elsewhere classified  Muscle weakness (generalized)  Unsteadiness on feet  Repeated falls  Stiffness of right shoulder, not elsewhere classified  Chronic right shoulder pain  Chronic pain of right knee  Other abnormalities of gait and mobility  Rationale for Evaluation and Treatment: Rehabilitation  ONSET DATE:  ACDF 05/25/23  SUBJECTIVE:   SUBJECTIVE STATEMENT:   Pt reports doing okay. Feels weak in the RLE but L Knee is bone on bone. I want to try that gel stuff before they try to cut me. Had a fall since previous visit because he tried ambulating to the bathroom w/o walker. Knee gave out.   PERTINENT HISTORY: See above   ACDF C3-C5 fusion, anterior cervical corpectomy C3-C4  PAIN:  Are you having pain? 4/10 Back  PRECAUTIONS:  Precautions:  Precautions Precautions: Fall, Cervical, Other (comment) Recall of Precautions/Restrictions: Impaired Precaution/Restrictions Comments: Verbally reviewed cervical  precautions. Required Braces or Orthoses: Cervical Brace Cervical Brace: Soft collar, At all times Restrictions Weight Bearing Restrictions Per Provider Order: No General: Pain: No c/o pain.   RED FLAGS: None   WEIGHT BEARING RESTRICTIONS: No  FALLS:  Has patient fallen in last 6 months? Yes. Number of falls 3- one that led to surgery, then 2 after getting home from the hospital  LIVING ENVIRONMENT: Lives with: lives with their son Lives in: House/apartment Stairs: ground floor apt  Has following equipment at home: Single point cane, Environmental Consultant - 2 wheeled, and Environmental Consultant - 4 wheeled  OCCUPATION: retired- used to pressure wash   PLOF: Independent, Independent with basic ADLs, Independent with gait, and Independent with transfers  PATIENT GOALS: get back to normal ability, drive convertible again   NEXT MD VISIT: Dr. Cornelio 11/15/23  OBJECTIVE:  Note: Objective measures were completed at Evaluation unless otherwise noted.  DIAGNOSTIC FINDINGS:    CLINICAL DATA:  63 year old male with altered mental status.   EXAM: CT HEAD WITHOUT CONTRAST   TECHNIQUE: Contiguous axial images were obtained from the base of the skull through the vertex without intravenous contrast.   RADIATION DOSE REDUCTION: This exam was performed according to the departmental dose-optimization program which includes automated exposure control, adjustment of the mA and/or kV according to patient size and/or use of iterative reconstruction technique.   COMPARISON:  Brain MRI and head CT 05/25/2023.   FINDINGS: Brain:   Unusual extensive, diffuse pachymeningeal calcification which has been present since at least 2013. Stable cerebral volume. No midline shift, ventriculomegaly, mass effect, evidence of mass lesion, intracranial hemorrhage or evidence of cortically based acute infarction. Small area of chronic encephalomalacia in the medial left occipital lobe redemonstrated. Stable gray-white  matter differentiation throughout the brain.   Vascular: Chronic severe pachymeningeal calcification.   Skull: Appears stable.  No acute osseous abnormality identified.   Sinuses/Orbits: Continued left middle ear and mastoid opacification. Other Visualized paranasal sinuses and mastoids are stable and well aerated.   Other: Anterior left scalp soft tissue injury has regressed since earlier in the month. Orbits soft tissues appears stable.   IMPRESSION: 1. No acute intracranial abnormality. 2. 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 3. Ongoing left middle ear and mastoid opacification. No acute osseous abnormality identified. 4. Regressed scalp soft tissue injury since 05/25/2023.  CLINICAL DATA:  Chronic bilateral knee pain and swelling.   EXAM: RIGHT KNEE - COMPLETE 4+ VIEW; LEFT KNEE - COMPLETE 4+ VIEW  COMPARISON:  Right knee radiographs dated 12/26/2021.   FINDINGS: Right knee:   No acute fracture or dislocation. There is a small to moderate-sized joint effusion. Moderate tricompartmental degenerative changes with joint space narrowing and marginal osteophytosis. Vascular calcifications are noted.   Left knee:   No acute fracture or dislocation. There is a small joint effusion. Moderate tricompartmental degenerative changes with joint space narrowing and marginal osteophytosis. Vascular calcifications are noted.   IMPRESSION: 1. No acute osseous abnormality. 2. 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  Narrative & Impression  CLINICAL DATA:  Chronic bilateral knee pain and swelling.   EXAM: RIGHT KNEE - COMPLETE 4+ VIEW; LEFT KNEE - COMPLETE 4+ VIEW   COMPARISON:  Right knee radiographs dated 12/26/2021.   FINDINGS: Right knee:   No acute fracture or dislocation. There is a small to moderate-sized joint  effusion. Moderate tricompartmental degenerative changes with joint space narrowing and marginal osteophytosis. Vascular calcifications are noted.   Left knee:   No acute fracture or dislocation. There is a small joint effusion. Moderate tricompartmental degenerative changes with joint space narrowing and marginal osteophytosis. Vascular calcifications are noted.   IMPRESSION: 1. No acute osseous abnormality. 2. 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    Narrative & Impression CLINICAL DATA:  Right hip pain.   EXAM: DG HIP (WITH OR WITHOUT PELVIS) 2-3V RIGHT   COMPARISON:  None Available.   FINDINGS: No acute fracture or dislocation. The bones are osteopenic. Cystic changes of the right femoral head and neck measure up to 3.7 cm. Orthopedic referral is advised. Mild arthritic changes of the right hip. Total left hip arthroplasty. The soft tissues are unremarkable. Vascular calcifications.   IMPRESSION: 1. No acute fracture or dislocation. 2. Cystic changes of the right femoral head and neck. Orthopedic referral is advised.  Lower Venous DVT Study   Patient Name:  Carl Tiedt Sr.  Date of Exam:   06/10/2023  Medical Rec #: [id number]            Accession #:    [id number]  Date of Birth: [birth year]             Patient Gender: M  Patient Age:   29 years  Exam Location[facility]  [facility]  Procedure:      VAS US  LOWER EXTREMITY VENOUS (DVT)  Referring Phys: PRENTICE COMPTON    - PATIENT SURVEYS:  PSFS: THE PATIENT SPECIFIC FUNCTIONAL SCALE  Place score of 0-10 (0 = unable to perform activity and 10 = able to perform activity at the same level as before injury or problem)  Activity Date: eval 09/12/23 11/07/23 11/28/23  Getting in and out of car (anything lower than 20 inches)  0 0   2. Balance  3 5   3. Walking without a device  2 5   4.  Social activities  0 0   Total Score 1.25  2.5     Total Score = Sum of activity scores/number of activities  Minimally Detectable Change: 3 points (for single activity); 2 points (for average score)  [facility] (nd). The Patient Specific Functional Scale . Retrieved from [email or web address]   COGNITION: Overall cognitive status: Within functional limits for tasks assessed     SENSATION: Hx of neuropathy even before fall/surgery     LOWER EXTREMITY MMT:  MMT Right eval Left eval Right 10/24/23 Left 10/24/23 RT/Left  10/31/23 RT/L 11/05/23 RT/L 11/19/23 RT/L 11/28/23 R/L 12/17/23  Hip flexion 3- 3+ 3 3+ 3/3+ 3+/4 4-/4+ 4-/4+ 3+/4  Hip extension           Hip abduction           Hip adduction           Hip internal rotation           Hip external rotation           Knee flexion 3- 4+ 4- 5 4/5 4/5 5/5  5/5  Knee extension 3- 5 3- 5 3-/5 3-/5 3/5 3p!/5 3+ *P/4-  Ankle dorsiflexion 4 5 5 5        Ankle plantarflexion           Ankle inversion           Ankle eversion            (Blank rows = not tested)    GAIT: Distance walked: in clinic distances  Assistive device utilized: Environmental Consultant - 2 wheeled Level of assistance: Modified independence Comments: slow but steady with RW                                                                                                                                TREATMENT DATE:  12/17/23: Nustep Lvl 5 for 7 min MMTs of the BLE (see above for results) Sit>Stand from elevated surface 2 x 8 Seated Trunk Rotations with Red Medball 2 x 10 each side Modified Sit Up edge of plinth 2 x 10 Seated Marches with green band around feet 2 x 8 each Seated Clamshells with green Tband 2 x 12  11/28/23 Nustep L 5 7 min LE Only S2S form elevated mat with UE 2x10 Leg press 50lb 2x10 LAQ 2lb 2x10 HS curls green 2x10   11/21/23 30# resited gait fwd 5 x min A Nustep L 5 LE only Leg pres 40lb 10x 50# 2 sets 10 HS curls 35# 10  x BIL. LLE 25# 10 x, RT LE 20# 10 x Knee Ext 5# 10 x LLE, unable to do RT STS 25in 2 sets 5 no UE, CG-min A with cuing for eccentric lowering LAQ RT LE 2 sets 10  107/25 NuStep L 5 x 6 min Goals Leg pres 40lb 3x10 6in step ups 2 rails x10 each 4in lateral step ups x10 each 2 rails LAQ 2x10  HS curls green x10   11/07/23 NuStep L 5 x 7 min Sit to stand 2x10 form elevated surface with UE use Leg press 40lb 2x10 6in step ups with 2 rails x10 Heel raises 2x10 LAQ 2lb 2x10  11/05/23 NuStep L5 x 6 min Goals  Sit to stands w/ UE from elevated mat 2x10 6in step ups with UE assit x10 on L, x5 on R  10/31/23 Nustep L 5 STGs assessed including MMT Leg Press 40# 2 sets 10, SL 10 x  BIL 20#, AA with RT to start 6 inch step up with UE support 10 x BIL CG- min A 4 inch step down with UE 10 xBIL CG-min A STS no hands CG-min A with cuing 28 in , could not do lower 10 x  10/29/23 Nustep L 5 BIL ankle red tband 4 way 10x 2 sets  Green tband HS curl 2 sets 10 LAQ 3# 2 sets 10 Standing with RW 3# 10 x SL hip flex,ext and abd,marching and HS curl Leg press 40# 2 sets 10. 20# SL 10 x BIL , assist to start on RT Green tband HS curl 2 sets 10   PATIENT EDUCATION:  Education details: exam findings, POC Person educated: Patient Education method: Explanation Education comprehension: verbalized understanding, returned demonstration, and needs further education  HOME EXERCISE PROGRAM: Educ on getting ankle wts, adjustable up to 5 is rec as we used 3 #- okayed doing seated and standing ex with RW  ASSESSMENT:  CLINICAL IMPRESSION:   Pt arrived with pain in bil knees (L>R).  Fatigue noted during session and required CGA-SBA with elevated Sit>stands.  He continues to struggle with sit to stands from regular chair height.  Modified interventions to reduce pain. Pt continues to benefit form skilled therapy to address deficiets   Patient is a 63 y.o. M who was seen today for physical  therapy evaluation and treatment for S14.109S (ICD-10-CM) - Spinal cord injury, cervical region, sequela (HCC) Pt tolerated a initial progression to therapy well. RLE is significantly weaker than R. Pt had log legs and a relatively short torso making it difficult to stand from low surfaces in conjunction with weak LE. Decrease coordination with side steps. No pain during session. Limited ROM with RLE during isolated exercises.  Will make every effort to improve function and work towards goals.   OBJECTIVE IMPAIRMENTS: Abnormal gait, decreased activity tolerance, decreased balance, decreased coordination, decreased knowledge of use of DME, decreased mobility, difficulty walking, decreased strength, postural dysfunction, and pain.   ACTIVITY LIMITATIONS: standing, squatting, stairs, transfers, bed mobility, toileting, dressing, and locomotion level  PARTICIPATION LIMITATIONS: shopping, community activity, and yard work  PERSONAL FACTORS: Age, Behavior pattern, Education, Fitness, Past/current experiences, Profession, Social background, and Time since onset of injury/illness/exacerbation are also affecting patient's functional outcome.   REHAB POTENTIAL: Good  CLINICAL DECISION MAKING: Evolving/moderate complexity  EVALUATION COMPLEXITY: Moderate   GOALS: Goals reviewed with patient? No  SHORT TERM GOALS: Target date: 10/24/2023   Will be compliant with appropriate progressive HEP  Baseline: Goal status: 10/08/23 MET  2.  Will be able to consistently perform functional STS tranfers from surfaces 20 inches or less  Baseline:  Goal status:10/08/23 on going  10/31/23 progressing still difficult for pt, 11/05/23 progressing still difficult for pt uses his UE, ongoing 11/19/23, Ongoing 11/28/23   3.  Will be able to ambulate at least 366ft with quad cane no more than light MinA  Baseline:  Goal status: 10/08/23 progressing  and 10/31/23  4.  MMT to be at least 3+/5 in all tested groups R LE   Baseline:  Goal status: ongoing 10/24/23  and 10/31/23, Progressing 11/19/23, Progressing 11/28/23    LONG TERM GOALS: Target date: 02/11/24   R LE MMT to be at least 4-/5 in all tested groups  Baseline:  Goal status: ongoing 10/24/23, Ongoing 11/05/23, Ongoing 11/19/23; progressing 12/17/23  2.  Will score at least 18 on DGI with no device to show reduced fall risk  Baseline:  Goal status: INITIAL  3.  Will be able to reciprocally ascend/descend steps without difficulty and U rail  Baseline:  Goal status: Ongoing 10/24/23, 11/28/23  4.  Will be able to ambulate community distances with LRAD, Mod(I) to allow for improved community access and participating in social activities  Baseline:  Goal status: Progressing 11/05/23  5.  Will be compliant with appropriate gym based exercise program by time of DC  Baseline:  Goal status: reports going to gym some with his son , using bicycle., Progressing 11/05/23 goes with his son; hasn't been recently 12/17/23  PLAN:  PT FREQUENCY: 2x/week  PT DURATION: 12 weeks  PLANNED INTERVENTIONS: 97750- Physical Performance Testing, 97110-Therapeutic exercises, 97530- Therapeutic activity, 97112- Neuromuscular re-education, 97535- Self Care, 02859- Manual therapy, and 97116- Gait training  PLAN FOR NEXT SESSION: progress func strength, gait and balance.  Bobbiejo Ishikawa, PT, DPT 12/17/23 12:39 PM

## 2023-12-19 ENCOUNTER — Ambulatory Visit: Admitting: Physical Therapy

## 2023-12-19 DIAGNOSIS — R262 Difficulty in walking, not elsewhere classified: Secondary | ICD-10-CM

## 2023-12-19 DIAGNOSIS — M6281 Muscle weakness (generalized): Secondary | ICD-10-CM

## 2023-12-19 DIAGNOSIS — R296 Repeated falls: Secondary | ICD-10-CM

## 2023-12-19 DIAGNOSIS — R2681 Unsteadiness on feet: Secondary | ICD-10-CM

## 2023-12-19 NOTE — Therapy (Signed)
 OUTPATIENT PHYSICAL THERAPY LOWER EXTREMITY TREATMENT    Patient Name: Carl Blanchfield Sr. MRN: [id number] DOB:[birth year], 63 y.o., male Today's Date: 12/19/2023  END OF SESSION:  PT End of Session - 12/19/23 0933     Visit Number 16    Date for Recertification  02/11/24    Authorization Type [employer] Dual    Authorization Time Period 09/12/23 to 12/05/23    PT Start Time 0930    PT Stop Time 1015    PT Time Calculation (min) 45 min           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    Parathyroid  abnormality    ectopic parathyroid  gland   Presence of arteriovenous fistula for hemodialysis, primary    RUE PER PT RLE   Refusal of blood product    NO WHOLE BLOOD PROUCTS   Renal cell carcinoma (HCC)    s/p hand assisted laparoscopic bilateral nephrectomies 11/29/17, + RCC left   Secondary hyperparathyroidism    Seizures (HCC)    one episode in past, due to elevated Potassium 08/02/20- at least 4 years ago   Sleep apnea    doesn't use CPAP anymore since weight loss   Stroke [facility])    no residual   Past Surgical History:  Procedure Laterality Date   ABDOMINAL AORTOGRAM W/LOWER EXTREMITY N/A 12/09/2023   Procedure: ABDOMINAL AORTOGRAM W/LOWER EXTREMITY;  Surgeon: Sheree Penne Bruckner, MD;  Location: [facility] INVASIVE CV LAB;  Service: Cardiovascular;  Laterality: N/A;   ANTERIOR CERVICAL CORPECTOMY N/A 05/25/2023   Procedure: ANTERIOR CERVICAL  CORPECTOMY CERVICAL FOUR, CERVICAL THREE - CERVICAL FIVE FUSION;  Surgeon: [name] Shove, MD;  Location: [facility] OR;  Service: Neurosurgery;  Laterality: N/A;   AV FISTULA PLACEMENT Right    right arm   BIOPSY  08/01/2019   Procedure: BIOPSY;  Surgeon: Kristie Lamprey, MD;  Location: [facility] ENDOSCOPY;  Service: Endoscopy;;   BIOPSY  11/18/2020   Procedure: BIOPSY;  Surgeon: Stacia Glendia BRAVO, MD;  Location: [facility] ENDOSCOPY;  Service: Gastroenterology;;   BIOPSY  09/13/2021   Procedure: BIOPSY;  Surgeon: Wilhelmenia Aloha Raddle., MD;  Location: [facility] ENDOSCOPY;  Service: Gastroenterology;;   CATARACT EXTRACTION W/ INTRAOCULAR LENS  IMPLANT, BILATERAL     COLON SURGERY     COLONOSCOPY N/A 08/04/2015   Procedure: COLONOSCOPY;  Surgeon: Gordy CHRISTELLA Starch, MD;  Location: [facility] ENDOSCOPY;  Service: Endoscopy;  Laterality: N/A;   COLONOSCOPY  2017   JMP@ [facility]-good prep-mass -recall 1 yr   COLONOSCOPY N/A 09/13/2021   Procedure: COLONOSCOPY;  Surgeon: Mansouraty, Aloha Raddle., MD;  Location: [facility] ENDOSCOPY;  Service: Gastroenterology;  Laterality: N/A;   COLONOSCOPY WITH PROPOFOL  N/A 11/25/2020   Procedure: COLONOSCOPY WITH PROPOFOL ;  Surgeon: Federico Rosario BROCKS, MD;  Location: [facility] ENDOSCOPY;  Service: Gastroenterology;  Laterality: N/A;   COLONOSCOPY WITH PROPOFOL  N/A 09/23/2021   Procedure: COLONOSCOPY WITH PROPOFOL ;  Surgeon: San Sandor GAILS, DO;  Location: [facility] ENDOSCOPY;  Service: Gastroenterology;  Laterality: N/A;   ENTEROSCOPY N/A 09/23/2021   Procedure: ENTEROSCOPY;  Surgeon: San Sandor GAILS, DO;  Location: [facility] ENDOSCOPY;  Service: Gastroenterology;  Laterality: N/A;  Will place order for video capsule study as we may opt to place it during procedure   ESOPHAGOGASTRODUODENOSCOPY N/A 08/01/2019   Procedure: ESOPHAGOGASTRODUODENOSCOPY (EGD);  Surgeon: Kristie Lamprey, MD;  Location: [facility] ENDOSCOPY;  Service: Endoscopy;  Laterality: N/A;   ESOPHAGOGASTRODUODENOSCOPY N/A 11/18/2020   Procedure: ESOPHAGOGASTRODUODENOSCOPY (EGD);  Surgeon:  Stacia Glendia BRAVO, MD;  Location: [facility] ENDOSCOPY;  Service: Gastroenterology;  Laterality: N/A;   ESOPHAGOGASTRODUODENOSCOPY (EGD) WITH PROPOFOL  N/A 08/04/2019   Procedure: ESOPHAGOGASTRODUODENOSCOPY (EGD) WITH PROPOFOL ;  Surgeon: Leigh Elspeth SQUIBB, MD;  Location: [facility] ENDOSCOPY;  Service: Gastroenterology;  Laterality: N/A;   ESOPHAGOGASTRODUODENOSCOPY (EGD) WITH PROPOFOL  N/A 09/13/2021   Procedure: ESOPHAGOGASTRODUODENOSCOPY (EGD) WITH PROPOFOL ;  Surgeon: Wilhelmenia Aloha Raddle., MD;  Location: [facility] ENDOSCOPY;  Service: Gastroenterology;  Laterality: N/A;   GIVENS CAPSULE STUDY N/A 09/23/2021   Procedure: GIVENS CAPSULE STUDY;  Surgeon: San Sandor GAILS, DO;  Location: [facility] ENDOSCOPY;  Service: Gastroenterology;  Laterality: N/A;   graft left arm Left    for dialysis x 2. Removed   HOT HEMOSTASIS  11/18/2020   Procedure: HOT HEMOSTASIS (ARGON PLASMA COAGULATION/BICAP);  Surgeon: Stacia Glendia BRAVO, MD;  Location: [name] ENDOSCOPY;  Service: Gastroenterology;;   HOT HEMOSTASIS N/A 09/23/2021   Procedure: HOT HEMOSTASIS (ARGON PLASMA COAGULATION/BICAP);  Surgeon: San Sandor GAILS, DO;  Location: [facility] ENDOSCOPY;  Service: Gastroenterology;  Laterality: N/A;   INSERTION OF DIALYSIS CATHETER     Rt chest   LAPAROSCOPIC RIGHT COLECTOMY N/A 08/05/2015   Procedure: LAPAROSCOPIC RIGHT COLECTOMY- ASCENDING;  Surgeon: Jina Nephew, MD;  Location: [facility] OR;  Service: General;  Laterality: N/A;   LOWER EXTREMITY ANGIOGRAPHY N/A 12/09/2023   Procedure: Lower Extremity Angiography;  Surgeon: Sheree Penne Bruckner, MD;  Location: [facility] INVASIVE CV LAB;  Service: Cardiovascular;  Laterality: N/A;   LOWER EXTREMITY INTERVENTION N/A 12/09/2023   Procedure: LOWER EXTREMITY INTERVENTION;  Surgeon: Sheree Penne Bruckner, MD;  Location: [facility] INVASIVE CV LAB;  Service: Cardiovascular;  Laterality: N/A;   MASS EXCISION Left 05/28/2019   Procedure: EXCISION SOFT TISSUE MASS LEFT SHOULDER;  Surgeon: Eletha Boas, MD;  Location:  [facility] ORS;  Service: General;  Laterality: Left;   NEPHRECTOMY Bilateral    PARATHYROIDECTOMY N/A 06/12/2016   Procedure: TOTAL PARATHYROIDECTOMY WITH AUTOTRANSPLANTATION TO LEFT FOREARM;  Surgeon: Boas Eletha, MD;  Location: [facility] OR;  Service: General;  Laterality: N/A;   PARATHYROIDECTOMY N/A 10/23/2016   Procedure: PARATHYROIDECTOMY;  Surgeon: Eletha Boas, MD;  Location[facility] OR;  Service: General;  Laterality: N/A;   REVERSE SHOULDER ARTHROPLASTY Right 08/24/2020   Procedure: REVERSE SHOULDER ARTHROPLASTY;  Surgeon: Cristy Bonner DASEN, MD;  Location: [facility] OR;  Service: Orthopedics;  Laterality: Right;   REVISON OF ARTERIOVENOUS FISTULA Right 07/16/2017   Procedure: REVISION OF ARTERIOVENOUS FISTULA  Right ARM;  Surgeon: Sheree Penne Bruckner, MD;  Location: [facility] OR;  Service: Vascular;  Laterality: Right;   TOTAL HIP ARTHROPLASTY Left 11/14/2020   Procedure: LEFT TOTAL HIP ARTHROPLASTY ANTERIOR APPROACH;  Surgeon: [name] Kay HERO, MD;  Location: [facility] OR;  Service: Orthopedics;  Laterality: Left;   UPPER GASTROINTESTINAL ENDOSCOPY  2021   @ [facility]   Patient Active Problem List   Diagnosis Date Noted   Right  knee buckling 11/15/2023   Primary osteoarthritis of right knee 11/15/2023   Abnormality of gait 11/15/2023   Primary osteoarthritis of left knee 10/03/2023   Right hip pain 07/14/2023   Coping style affecting medical condition 06/05/2023   Spinal cord injury, cervical region, sequela 05/30/2023   Cervical vertebral fracture (HCC) 05/25/2023   History of GI bleed 05/25/2023   C4 cervical fracture (HCC) 05/25/2023   Duodenitis    Gastritis and gastroduodenitis    Ischemic colitis    Bright red blood per rectum    ABLA (acute blood loss anemia)    Gastric ulcer    Duodenal ulcer    Diverticulosis of colon without hemorrhage    GI bleed 09/17/2021   Acute upper GI bleeding 09/11/2021   Mucosal abnormality of esophagus    Mucosal abnormality of stomach    Mucosal abnormality of duodenum     Upper GI bleed    Left hip pain 11/16/2020   Type 2 diabetes mellitus with other specified complication (HCC) 11/14/2020   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/14/2020   Primary osteoarthritis of left hip 10/07/2020   Status post reverse total arthroplasty of right shoulder 08/24/2020   Diverticulitis 03/08/2020   ESRD (end stage renal disease) (HCC) 03/08/2020   Polyneuropathy in diseases classified elsewhere 03/08/2020   Pure hypercholesterolemia 03/08/2020   Sacral back pain 03/08/2020   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 11/10/2019   Acute gastric ulcer with hemorrhage    Acute GI bleeding 07/31/2019   Hypotension of hemodialysis 07/31/2019   Diverticulitis large intestine 07/31/2019   Mass of soft tissue of shoulder 05/26/2019   Awaiting organ transplant status 03/18/2019   Finding of cocaine in blood 02/25/2019   Anaphylactic shock, unspecified, sequela 11/21/2018   Hypercalcemia 10/28/2018   Diarrhea, unspecified 08/08/2018   Hyperkalemia 03/12/2018   Renal cell carcinoma of left kidney (HCC) 12/24/2017   Fluid overload, unspecified 07/06/2017   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   Other specified disorders of kidney and ureter 11/22/2016   Secondary hyperparathyroidism of renal origin 10/23/2016   Headache disorder 08/21/2016   TIA (transient ischemic attack)    Essential hypertension    Seizure (HCC)    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    Lung nodule    Diverticulitis of cecum 07/21/2015   Diverticulitis of large intestine without perforation or abscess without bleeding    Right lower quadrant pain    Obstructive sleep apnea 02/08/2015   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   Diverticulosis 07/13/2014   Lower GI bleed 07/12/2014   Refusal of blood transfusions as patient is Jehovah's Witness    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   HLD (hyperlipidemia)    PRES  (posterior reversible encephalopathy syndrome)    History of CVA (cerebrovascular accident) 05/23/2014   Anemia of chronic disease 05/23/2014   GERD (gastroesophageal reflux disease) 12/09/2012   Gastrointestinal bleeding 11/12/2012   Melena 11/12/2012   Other psychoactive substance dependence, uncomplicated (HCC) 10/08/2012   Iron deficiency anemia, unspecified 06/10/2012   Moderate protein-calorie malnutrition 07/29/2009   Coagulation defect, unspecified 08/29/2006   Type 2 diabetes mellitus with diabetic peripheral angiopathy without gangrene (HCC) 08/29/2006   Hypertensive chronic kidney disease with stage 5 chronic kidney disease or end stage renal disease (HCC) 08/02/2006    PCP: [name] MD   REFERRING PROVIDER: Debby Fidela CROME, NP  REFERRING DIAG:  Diagnosis  S14.109S (ICD-10-CM) - Spinal  cord injury, cervical region, sequela (HCC)    THERAPY DIAG:  Difficulty in walking, not elsewhere classified  Muscle weakness (generalized)  Unsteadiness on feet  Repeated falls  Rationale for Evaluation and Treatment: Rehabilitation  ONSET DATE:  ACDF 05/25/23  SUBJECTIVE:   SUBJECTIVE STATEMENT:   doing okay. Trying to get knee inj  PERTINENT HISTORY: See above   ACDF C3-C5 fusion, anterior cervical corpectomy C3-C4  PAIN:  Are you having pain? 4/10 Back  PRECAUTIONS:  Precautions:  Precautions Precautions: Fall, Cervical, Other (comment) Recall of Precautions/Restrictions: Impaired Precaution/Restrictions Comments: Verbally reviewed cervical precautions. Required Braces or Orthoses: Cervical Brace Cervical Brace: Soft collar, At all times Restrictions Weight Bearing Restrictions Per Provider Order: No General: Pain: No c/o pain.   RED FLAGS: None   WEIGHT BEARING RESTRICTIONS: No  FALLS:  Has patient fallen in last 6 months? Yes. Number of falls 3- one that led to surgery, then 2 after getting home from the hospital  LIVING ENVIRONMENT: Lives  with: lives with their son Lives in: House/apartment Stairs: ground floor apt  Has following equipment at home: Single point cane, Environmental Consultant - 2 wheeled, and Environmental Consultant - 4 wheeled  OCCUPATION: retired- used to pressure wash   PLOF: Independent, Independent with basic ADLs, Independent with gait, and Independent with transfers  PATIENT GOALS: get back to normal ability, drive convertible again   NEXT MD VISIT: Dr. Cornelio 11/15/23  OBJECTIVE:  Note: Objective measures were completed at Evaluation unless otherwise noted.  DIAGNOSTIC FINDINGS:    CLINICAL DATA:  63 year old male with altered mental status.   EXAM: CT HEAD WITHOUT CONTRAST   TECHNIQUE: Contiguous axial images were obtained from the base of the skull through the vertex without intravenous contrast.   RADIATION DOSE REDUCTION: This exam was performed according to the departmental dose-optimization program which includes automated exposure control, adjustment of the mA and/or kV according to patient size and/or use of iterative reconstruction technique.   COMPARISON:  Brain MRI and head CT 05/25/2023.   FINDINGS: Brain:   Unusual extensive, diffuse pachymeningeal calcification which has been present since at least 2013. Stable cerebral volume. No midline shift, ventriculomegaly, mass effect, evidence of mass lesion, intracranial hemorrhage or evidence of cortically based acute infarction. Small area of chronic encephalomalacia in the medial left occipital lobe redemonstrated. Stable gray-white matter differentiation throughout the brain.   Vascular: Chronic severe pachymeningeal calcification.   Skull: Appears stable.  No acute osseous abnormality identified.   Sinuses/Orbits: Continued left middle ear and mastoid opacification. Other Visualized paranasal sinuses and mastoids are stable and well aerated.   Other: Anterior left scalp soft tissue injury has regressed since earlier in the month. Orbits soft  tissues appears stable.   IMPRESSION: 1. No acute intracranial abnormality. 2. 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 3. Ongoing left middle ear and mastoid opacification. No acute osseous abnormality identified. 4. Regressed scalp soft tissue injury since 05/25/2023.  CLINICAL DATA:  Chronic bilateral knee pain and swelling.   EXAM: RIGHT KNEE - COMPLETE 4+ VIEW; LEFT KNEE - COMPLETE 4+ VIEW   COMPARISON:  Right knee radiographs dated 12/26/2021.   FINDINGS: Right knee:   No acute fracture or dislocation. There is a small to moderate-sized joint effusion. Moderate tricompartmental degenerative changes with joint space narrowing and marginal osteophytosis. Vascular calcifications are noted.   Left knee:   No acute fracture or dislocation. There is a small joint effusion. Moderate tricompartmental degenerative changes with joint space narrowing  and marginal osteophytosis. Vascular calcifications are noted.   IMPRESSION: 1. No acute osseous abnormality. 2. 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  Narrative & Impression  CLINICAL DATA:  Chronic bilateral knee pain and swelling.   EXAM: RIGHT KNEE - COMPLETE 4+ VIEW; LEFT KNEE - COMPLETE 4+ VIEW   COMPARISON:  Right knee radiographs dated 12/26/2021.   FINDINGS: Right knee:   No acute fracture or dislocation. There is a small to moderate-sized joint effusion. Moderate tricompartmental degenerative changes with joint space narrowing and marginal osteophytosis. Vascular calcifications are noted.   Left knee:   No acute fracture or dislocation. There is a small joint effusion. Moderate tricompartmental degenerative changes with joint space narrowing and marginal osteophytosis. Vascular calcifications are noted.   IMPRESSION: 1. No acute osseous abnormality. 2.  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    Narrative & Impression CLINICAL DATA:  Right hip pain.   EXAM: DG HIP (WITH OR WITHOUT PELVIS) 2-3V RIGHT   COMPARISON:  None Available.   FINDINGS: No acute fracture or dislocation. The bones are osteopenic. Cystic changes of the right femoral head and neck measure up to 3.7 cm. Orthopedic referral is advised. Mild arthritic changes of the right hip. Total left hip arthroplasty. The soft tissues are unremarkable. Vascular calcifications.   IMPRESSION: 1. No acute fracture or dislocation. 2. Cystic changes of the right femoral head and neck. Orthopedic referral is advised.  Lower Venous DVT Study   Patient Name:  Carl Weitzel Sr.  Date of Exam:   06/10/2023  Medical Rec #: [id number]            Accession #:    [id number]  Date of Birth: 05-Aug-1960             Patient Gender: M  Patient Age:   72 years  Exam Location[facility]  [facility]  Procedure:      VAS US  LOWER EXTREMITY VENOUS (DVT)  Referring Phys: PRENTICE COMPTON    - PATIENT SURVEYS:  PSFS: THE PATIENT SPECIFIC FUNCTIONAL SCALE  Place score of 0-10 (0 = unable to perform activity and 10 = able to perform activity at the same level as before injury or problem)  Activity Date: eval 09/12/23 11/07/23 11/28/23  Getting in and out of car (anything lower than 20 inches)  0 0   2. Balance  3 5   3. Walking without a device  2 5   4.  Social activities  0 0   Total Score 1.25 2.5     Total Score = Sum of activity scores/number of activities  Minimally Detectable Change: 3 points (for single activity); 2 points (for average score)  [facility] (nd). The Patient Specific Functional Scale . Retrieved from [email or web address]   COGNITION: Overall cognitive status: Within functional limits for tasks assessed     SENSATION: Hx  of neuropathy even before fall/surgery     LOWER EXTREMITY MMT:  MMT Right eval Left eval Right 10/24/23 Left 10/24/23 RT/Left 10/31/23 RT/L 11/05/23 RT/L 11/19/23 RT/L 11/28/23 R/L 12/17/23  Hip flexion 3- 3+ 3 3+ 3/3+ 3+/4 4-/4+ 4-/4+ 3+/4  Hip extension           Hip abduction           Hip adduction           Hip internal rotation  Hip external rotation           Knee flexion 3- 4+ 4- 5 4/5 4/5 5/5  5/5  Knee extension 3- 5 3- 5 3-/5 3-/5 3/5 3p!/5 3+ *P/4-  Ankle dorsiflexion 4 5 5 5        Ankle plantarflexion           Ankle inversion           Ankle eversion            (Blank rows = not tested)    GAIT: Distance walked: in clinic distances  Assistive device utilized: Environmental Consultant - 2 wheeled Level of assistance: Modified independence Comments: slow but steady with RW                                                                                                                                TREATMENT DATE:   12/19/23 Leg press ( cued to work = pressure BIL)60 # 10 , 70# 10 x LAQ 3# 2x10 HS curls green 2x10 STS elevated mat 2 sets 5 with cuing to lean fwd and push thru legs and bend knees to sit vs lean back and brace with back of leg Nustep L 6 LE only 6 inch step up 10 x each with UE work on full [employer] ball core stab ex- seated and standing    12/17/23: Nustep Lvl 5 for 7 min MMTs of the BLE (see above for results) Sit>Stand from elevated surface 2 x 8 Seated Trunk Rotations with Red Medball 2 x 10 each side Modified Sit Up edge of plinth 2 x 10 Seated Marches with green band around feet 2 x 8 each Seated Clamshells with green Tband 2 x 12  11/28/23 Nustep L 5 7 min LE Only S2S form elevated mat with UE 2x10 Leg press 50lb 2x10 LAQ 2lb 2x10 HS curls green 2x10   11/21/23 30# resited gait fwd 5 x min A Nustep L 5 LE only Leg pres 40lb 10x 50# 2 sets 10 HS curls 35# 10 x BIL. LLE 25# 10 x, RT LE 20# 10 x Knee Ext 5# 10 x LLE, unable to  do RT STS 25in 2 sets 5 no UE, CG-min A with cuing for eccentric lowering LAQ RT LE 2 sets 10  107/25 NuStep L 5 x 6 min Goals Leg pres 40lb 3x10 6in step ups 2 rails x10 each 4in lateral step ups x10 each 2 rails LAQ 2x10  HS curls green x10   11/07/23 NuStep L 5 x 7 min Sit to stand 2x10 form elevated surface with UE use Leg press 40lb 2x10 6in step ups with 2 rails x10 Heel raises 2x10 LAQ 2lb 2x10  11/05/23 NuStep L5 x 6 min Goals  Sit to stands w/ UE from elevated mat 2x10 6in step ups with UE assit x10 on L, x5 on R  10/31/23 Nustep L 5 STGs  assessed including MMT Leg Press 40# 2 sets 10, SL 10 x BIL 20#, AA with RT to start 6 inch step up with UE support 10 x BIL CG- min A 4 inch step down with UE 10 xBIL CG-min A STS no hands CG-min A with cuing 28 in , could not do lower 10 x  10/29/23 Nustep L 5 BIL ankle red tband 4 way 10x 2 sets  Green tband HS curl 2 sets 10 LAQ 3# 2 sets 10 Standing with RW 3# 10 x SL hip flex,ext and abd,marching and HS curl Leg press 40# 2 sets 10. 20# SL 10 x BIL , assist to start on RT Green tband HS curl 2 sets 10   PATIENT EDUCATION:  Education details: exam findings, POC Person educated: Patient Education method: Explanation Education comprehension: verbalized understanding, returned demonstration, and needs further education  HOME EXERCISE PROGRAM: Educ on getting ankle wts, adjustable up to 5 is rec as we used 3 #- okayed doing seated and standing ex with RW  ASSESSMENT:  CLINICAL IMPRESSION:   Progressed wt. With cuing for full ROM and = wt distribution. STS elevated mat 2 sets 5 with cuing to lean fwd and push thru legs and bend knees to sit vs lean back and brace with back of leg .Pt continues to benefit form skilled therapy to address deficiets   Patient is a 63 y.o. M who was seen today for physical therapy evaluation and treatment for S14.109S (ICD-10-CM) - Spinal cord injury, cervical region, sequela  (HCC) Pt tolerated a initial progression to therapy well. RLE is significantly weaker than R. Pt had log legs and a relatively short torso making it difficult to stand from low surfaces in conjunction with weak LE. Decrease coordination with side steps. No pain during session. Limited ROM with RLE during isolated exercises.  Will make every effort to improve function and work towards goals.   OBJECTIVE IMPAIRMENTS: Abnormal gait, decreased activity tolerance, decreased balance, decreased coordination, decreased knowledge of use of DME, decreased mobility, difficulty walking, decreased strength, postural dysfunction, and pain.   ACTIVITY LIMITATIONS: standing, squatting, stairs, transfers, bed mobility, toileting, dressing, and locomotion level  PARTICIPATION LIMITATIONS: shopping, community activity, and yard work  PERSONAL FACTORS: Age, Behavior pattern, Education, Fitness, Past/current experiences, Profession, Social background, and Time since onset of injury/illness/exacerbation are also affecting patient's functional outcome.   REHAB POTENTIAL: Good  CLINICAL DECISION MAKING: Evolving/moderate complexity  EVALUATION COMPLEXITY: Moderate   GOALS: Goals reviewed with patient? No  SHORT TERM GOALS: Target date: 10/24/2023   Will be compliant with appropriate progressive HEP  Baseline: Goal status: 10/08/23 MET  2.  Will be able to consistently perform functional STS tranfers from surfaces 20 inches or less  Baseline:  Goal status:10/08/23 on going  10/31/23 progressing still difficult for pt, 11/05/23 progressing still difficult for pt uses his UE, ongoing 11/19/23, Ongoing 11/28/23   3.  Will be able to ambulate at least 370ft with quad cane no more than light MinA  Baseline:  Goal status: 10/08/23 progressing  and 10/31/23  4.  MMT to be at least 3+/5 in all tested groups R LE  Baseline:  Goal status: ongoing 10/24/23  and 10/31/23, Progressing 11/19/23, Progressing 11/28/23    LONG  TERM GOALS: Target date: 02/11/24   R LE MMT to be at least 4-/5 in all tested groups  Baseline:  Goal status: ongoing 10/24/23, Ongoing 11/05/23, Ongoing 11/19/23; progressing 12/17/23  2.  Will score at least  18 on DGI with no device to show reduced fall risk  Baseline:  Goal status: INITIAL  3.  Will be able to reciprocally ascend/descend steps without difficulty and U rail  Baseline:  Goal status: Ongoing 10/24/23, 11/28/23  4.  Will be able to ambulate community distances with LRAD, Mod(I) to allow for improved community access and participating in social activities  Baseline:  Goal status: Progressing 11/05/23  5.  Will be compliant with appropriate gym based exercise program by time of DC  Baseline:  Goal status: reports going to gym some with his son , using bicycle., Progressing 11/05/23 goes with his son; hasn't been recently 12/17/23  PLAN:  PT FREQUENCY: 2x/week  PT DURATION: 12 weeks  PLANNED INTERVENTIONS: 97750- Physical Performance Testing, 97110-Therapeutic exercises, 97530- Therapeutic activity, 97112- Neuromuscular re-education, 97535- Self Care, 02859- Manual therapy, and 97116- Gait training  PLAN FOR NEXT SESSION: progress func strength, gait and balance.  Angie Carston Riedl PTA 12/19/23 9:33 AM

## 2023-12-24 ENCOUNTER — Ambulatory Visit: Admitting: Physical Therapy

## 2023-12-24 DIAGNOSIS — R262 Difficulty in walking, not elsewhere classified: Secondary | ICD-10-CM | POA: Diagnosis not present

## 2023-12-24 DIAGNOSIS — M6281 Muscle weakness (generalized): Secondary | ICD-10-CM

## 2023-12-24 DIAGNOSIS — R2681 Unsteadiness on feet: Secondary | ICD-10-CM

## 2023-12-24 DIAGNOSIS — R296 Repeated falls: Secondary | ICD-10-CM

## 2023-12-24 NOTE — Therapy (Signed)
 OUTPATIENT PHYSICAL THERAPY LOWER EXTREMITY TREATMENT    Patient Name: Carl Valentino Sr. MRN: [id number] DOB:[birth year], 63 y.o., male Today's Date: 12/24/2023  END OF SESSION:  PT End of Session - 12/24/23 0800     Visit Number 17    Number of Visits 25    Date for Recertification  02/11/24    Authorization Type [employer] Dual    PT Start Time 0800    PT Stop Time 0845    PT Time Calculation (min) 45 min           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    Parathyroid  abnormality    ectopic parathyroid  gland   Presence of arteriovenous fistula for hemodialysis, primary    RUE PER PT RLE   Refusal of blood product    NO WHOLE BLOOD PROUCTS   Renal cell carcinoma (HCC)    s/p hand assisted laparoscopic bilateral nephrectomies 11/29/17, + RCC left   Secondary hyperparathyroidism    Seizures (HCC)    one episode in past, due to elevated Potassium 08/02/20- at least 4 years ago   Sleep apnea    doesn't use CPAP anymore since weight loss   Stroke [facility])    no residual   Past Surgical History:  Procedure Laterality Date   ABDOMINAL AORTOGRAM W/LOWER EXTREMITY N/A 12/09/2023   Procedure: ABDOMINAL AORTOGRAM W/LOWER EXTREMITY;  Surgeon: Sheree Penne Bruckner, MD;  Location: [facility] INVASIVE CV LAB;  Service: Cardiovascular;  Laterality: N/A;   ANTERIOR CERVICAL CORPECTOMY N/A 05/25/2023   Procedure: ANTERIOR CERVICAL CORPECTOMY CERVICAL FOUR, CERVICAL  THREE - CERVICAL FIVE FUSION;  Surgeon: [name] Shove, MD;  Location: [facility] OR;  Service: Neurosurgery;  Laterality: N/A;   AV FISTULA PLACEMENT Right    right arm   BIOPSY  08/01/2019   Procedure: BIOPSY;  Surgeon: Kristie Lamprey, MD;  Location: [name] ENDOSCOPY;  Service: Endoscopy;;   BIOPSY  11/18/2020   Procedure: BIOPSY;  Surgeon: Stacia Glendia BRAVO, MD;  Location: [facility] ENDOSCOPY;  Service: Gastroenterology;;   BIOPSY  09/13/2021   Procedure: BIOPSY;  Surgeon: Wilhelmenia Aloha Raddle., MD;  Location: [facility] ENDOSCOPY;  Service: Gastroenterology;;   CATARACT EXTRACTION W/ INTRAOCULAR LENS  IMPLANT, BILATERAL     COLON SURGERY     COLONOSCOPY N/A 08/04/2015   Procedure: COLONOSCOPY;  Surgeon: Gordy CHRISTELLA Starch, MD;  Location: [facility] ENDOSCOPY;  Service: Endoscopy;  Laterality: N/A;   COLONOSCOPY  2017   JMP@ [facility]-good prep-mass -recall 1 yr   COLONOSCOPY N/A 09/13/2021   Procedure: COLONOSCOPY;  Surgeon: Mansouraty, Aloha Raddle., MD;  Location: [facility] ENDOSCOPY;  Service: Gastroenterology;  Laterality: N/A;   COLONOSCOPY WITH PROPOFOL  N/A 11/25/2020   Procedure: COLONOSCOPY WITH PROPOFOL ;  Surgeon: Federico Rosario BROCKS, MD;  Location: [facility] ENDOSCOPY;  Service: Gastroenterology;  Laterality: N/A;  COLONOSCOPY WITH PROPOFOL  N/A 09/23/2021   Procedure: COLONOSCOPY WITH PROPOFOL ;  Surgeon: San Sandor GAILS, DO;  Location: [facility] ENDOSCOPY;  Service: Gastroenterology;  Laterality: N/A;   ENTEROSCOPY N/A 09/23/2021   Procedure: ENTEROSCOPY;  Surgeon: San Sandor GAILS, DO;  Location: [facility] ENDOSCOPY;  Service: Gastroenterology;  Laterality: N/A;  Will place order for video capsule study as we may opt to place it during procedure   ESOPHAGOGASTRODUODENOSCOPY N/A 08/01/2019   Procedure: ESOPHAGOGASTRODUODENOSCOPY (EGD);  Surgeon: Kristie Lamprey, MD;  Location: [facility] ENDOSCOPY;  Service: Endoscopy;  Laterality: N/A;   ESOPHAGOGASTRODUODENOSCOPY N/A 11/18/2020   Procedure: ESOPHAGOGASTRODUODENOSCOPY (EGD);  Surgeon: Stacia Glendia BRAVO, MD;   Location: [facility] ENDOSCOPY;  Service: Gastroenterology;  Laterality: N/A;   ESOPHAGOGASTRODUODENOSCOPY (EGD) WITH PROPOFOL  N/A 08/04/2019   Procedure: ESOPHAGOGASTRODUODENOSCOPY (EGD) WITH PROPOFOL ;  Surgeon: Leigh Elspeth SQUIBB, MD;  Location: [facility] ENDOSCOPY;  Service: Gastroenterology;  Laterality: N/A;   ESOPHAGOGASTRODUODENOSCOPY (EGD) WITH PROPOFOL  N/A 09/13/2021   Procedure: ESOPHAGOGASTRODUODENOSCOPY (EGD) WITH PROPOFOL ;  Surgeon: Wilhelmenia Aloha Raddle., MD;  Location: [facility] ENDOSCOPY;  Service: Gastroenterology;  Laterality: N/A;   GIVENS CAPSULE STUDY N/A 09/23/2021   Procedure: GIVENS CAPSULE STUDY;  Surgeon: San Sandor GAILS, DO;  Location: [facility] ENDOSCOPY;  Service: Gastroenterology;  Laterality: N/A;   graft left arm Left    for dialysis x 2. Removed   HOT HEMOSTASIS  11/18/2020   Procedure: HOT HEMOSTASIS (ARGON PLASMA COAGULATION/BICAP);  Surgeon: Stacia Glendia BRAVO, MD;  Location: [facility] ENDOSCOPY;  Service: Gastroenterology;;   HOT HEMOSTASIS N/A 09/23/2021   Procedure: HOT HEMOSTASIS (ARGON PLASMA COAGULATION/BICAP);  Surgeon: San Sandor GAILS, DO;  Location: [facility] ENDOSCOPY;  Service: Gastroenterology;  Laterality: N/A;   INSERTION OF DIALYSIS CATHETER     Rt chest   LAPAROSCOPIC RIGHT COLECTOMY N/A 08/05/2015   Procedure: LAPAROSCOPIC RIGHT COLECTOMY- ASCENDING;  Surgeon: Jina Nephew, MD;  Location: [facility] OR;  Service: General;  Laterality: N/A;   LOWER EXTREMITY ANGIOGRAPHY N/A 12/09/2023   Procedure: Lower Extremity Angiography;  Surgeon: Sheree Penne Bruckner, MD;  Location: [facility] INVASIVE CV LAB;  Service: Cardiovascular;  Laterality: N/A;   LOWER EXTREMITY INTERVENTION N/A 12/09/2023   Procedure: LOWER EXTREMITY INTERVENTION;  Surgeon: Sheree Penne Bruckner, MD;  Location: [facility] INVASIVE CV LAB;  Service: Cardiovascular;  Laterality: N/A;   MASS EXCISION Left 05/28/2019   Procedure: EXCISION SOFT TISSUE MASS LEFT SHOULDER;  Surgeon: Eletha Boas, MD;  Location: [facility] ORS;  Service:  General;  Laterality: Left;   NEPHRECTOMY Bilateral    PARATHYROIDECTOMY N/A 06/12/2016   Procedure: TOTAL PARATHYROIDECTOMY WITH AUTOTRANSPLANTATION TO LEFT FOREARM;  Surgeon: Boas Eletha, MD;  Location: [facility] OR;  Service: General;  Laterality: N/A;   PARATHYROIDECTOMY N/A 10/23/2016   Procedure: PARATHYROIDECTOMY;  Surgeon: Eletha Boas, MD;  Location: [facility] OR;  Service: General;  Laterality: N/A;   REVERSE SHOULDER ARTHROPLASTY Right 08/24/2020   Procedure: REVERSE SHOULDER ARTHROPLASTY;  Surgeon: Cristy Bonner DASEN, MD;  Location: [facility] OR;  Service: Orthopedics;  Laterality: Right;   REVISON OF ARTERIOVENOUS FISTULA Right 07/16/2017   Procedure: REVISION OF ARTERIOVENOUS FISTULA  Right ARM;  Surgeon: Sheree Penne Bruckner, MD;  Location: [facility] OR;  Service: Vascular;  Laterality: Right;   TOTAL HIP ARTHROPLASTY Left 11/14/2020   Procedure: LEFT TOTAL HIP ARTHROPLASTY ANTERIOR APPROACH;  Surgeon: [name] Kay HERO, MD;  Location: [facility] OR;  Service: Orthopedics;  Laterality: Left;   UPPER GASTROINTESTINAL ENDOSCOPY  2021   @ [facility]   Patient Active Problem List   Diagnosis Date Noted   Right knee buckling 11/15/2023  Primary osteoarthritis of right knee 11/15/2023   Abnormality of gait 11/15/2023   Primary osteoarthritis of left knee 10/03/2023   Right hip pain 07/14/2023   Coping style affecting medical condition 06/05/2023   Spinal cord injury, cervical region, sequela 05/30/2023   Cervical vertebral fracture (HCC) 05/25/2023   History of GI bleed 05/25/2023   C4 cervical fracture (HCC) 05/25/2023   Duodenitis    Gastritis and gastroduodenitis    Ischemic colitis    Bright red blood per rectum    ABLA (acute blood loss anemia)    Gastric ulcer    Duodenal ulcer    Diverticulosis of colon without hemorrhage    GI bleed 09/17/2021   Acute upper GI bleeding 09/11/2021   Mucosal abnormality of esophagus    Mucosal abnormality of stomach    Mucosal abnormality of duodenum    Upper GI bleed     Left hip pain 11/16/2020   Type 2 diabetes mellitus with other specified complication (HCC) 11/14/2020   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/14/2020   Primary osteoarthritis of left hip 10/07/2020   Status post reverse total arthroplasty of right shoulder 08/24/2020   Diverticulitis 03/08/2020   ESRD (end stage renal disease) (HCC) 03/08/2020   Polyneuropathy in diseases classified elsewhere 03/08/2020   Pure hypercholesterolemia 03/08/2020   Sacral back pain 03/08/2020   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 11/10/2019   Acute gastric ulcer with hemorrhage    Acute GI bleeding 07/31/2019   Hypotension of hemodialysis 07/31/2019   Diverticulitis large intestine 07/31/2019   Mass of soft tissue of shoulder 05/26/2019   Awaiting organ transplant status 03/18/2019   Finding of cocaine in blood 02/25/2019   Anaphylactic shock, unspecified, sequela 11/21/2018   Hypercalcemia 10/28/2018   Diarrhea, unspecified 08/08/2018   Hyperkalemia 03/12/2018   Renal cell carcinoma of left kidney (HCC) 12/24/2017   Fluid overload, unspecified 07/06/2017   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   Other specified disorders of kidney and ureter 11/22/2016   Secondary hyperparathyroidism of renal origin 10/23/2016   Headache disorder 08/21/2016   TIA (transient ischemic attack)    Essential hypertension    Seizure (HCC)    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    Lung nodule    Diverticulitis of cecum 07/21/2015   Diverticulitis of large intestine without perforation or abscess without bleeding    Right lower quadrant pain    Obstructive sleep apnea 02/08/2015   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   Diverticulosis 07/13/2014   Lower GI bleed 07/12/2014   Refusal of blood transfusions as patient is Jehovah's Witness    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   HLD (hyperlipidemia)    PRES (posterior reversible  encephalopathy syndrome)    History of CVA (cerebrovascular accident) 05/23/2014   Anemia of chronic disease 05/23/2014   GERD (gastroesophageal reflux disease) 12/09/2012   Gastrointestinal bleeding 11/12/2012   Melena 11/12/2012   Other psychoactive substance dependence, uncomplicated (HCC) 10/08/2012   Iron deficiency anemia, unspecified 06/10/2012   Moderate protein-calorie malnutrition 07/29/2009   Coagulation defect, unspecified 08/29/2006   Type 2 diabetes mellitus with diabetic peripheral angiopathy without gangrene (HCC) 08/29/2006   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 Denies any recent falls  PERTINENT HISTORY: See above   ACDF C3-C5 fusion, anterior cervical corpectomy C3-C4  PAIN:  Are you having pain? 4/10 Back  PRECAUTIONS:  Precautions:  Precautions Precautions: Fall, Cervical, Other (comment) Recall of Precautions/Restrictions: Impaired Precaution/Restrictions Comments: Verbally reviewed cervical precautions. Required Braces or Orthoses: Cervical Brace Cervical Brace: Soft collar, At all times Restrictions Weight Bearing Restrictions Per Provider Order: No General: Pain: No c/o pain.   RED FLAGS: None   WEIGHT BEARING RESTRICTIONS: No  FALLS:  Has patient fallen in last 6 months? Yes. Number of falls 3- one that led to surgery, then 2 after getting home from the hospital  LIVING ENVIRONMENT: Lives with:  lives with their son Lives in: House/apartment Stairs: ground floor apt  Has following equipment at home: Single point cane, Environmental Consultant - 2 wheeled, and Environmental Consultant - 4 wheeled  OCCUPATION: retired- used to pressure wash   PLOF: Independent, Independent with basic ADLs, Independent with gait, and Independent with transfers  PATIENT GOALS: get back to normal ability, drive convertible again   NEXT MD VISIT: Dr. Cornelio 11/15/23  OBJECTIVE:  Note: Objective measures were completed at Evaluation unless otherwise noted.  DIAGNOSTIC FINDINGS:    CLINICAL DATA:  63 year old male with altered mental status.   EXAM: CT HEAD WITHOUT CONTRAST   TECHNIQUE: Contiguous axial images were obtained from the base of the skull through the vertex without intravenous contrast.   RADIATION DOSE REDUCTION: This exam was performed according to the departmental dose-optimization program which includes automated exposure control, adjustment of the mA and/or kV according to patient size and/or use of iterative reconstruction technique.   COMPARISON:  Brain MRI and head CT 05/25/2023.   FINDINGS: Brain:   Unusual extensive, diffuse pachymeningeal calcification which has been present since at least 2013. Stable cerebral volume. No midline shift, ventriculomegaly, mass effect, evidence of mass lesion, intracranial hemorrhage or evidence of cortically based acute infarction. Small area of chronic encephalomalacia in the medial left occipital lobe redemonstrated. Stable gray-white matter differentiation throughout the brain.   Vascular: Chronic severe pachymeningeal calcification.   Skull: Appears stable.  No acute osseous abnormality identified.   Sinuses/Orbits: Continued left middle ear and mastoid opacification. Other Visualized paranasal sinuses and mastoids are stable and well aerated.   Other: Anterior left scalp soft tissue injury has regressed since earlier in the month. Orbits soft tissues  appears stable.   IMPRESSION: 1. No acute intracranial abnormality. 2. 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 3. Ongoing left middle ear and mastoid opacification. No acute osseous abnormality identified. 4. Regressed scalp soft tissue injury since 05/25/2023.  CLINICAL DATA:  Chronic bilateral knee pain and swelling.   EXAM: RIGHT KNEE - COMPLETE 4+ VIEW; LEFT KNEE - COMPLETE 4+ VIEW   COMPARISON:  Right knee radiographs dated 12/26/2021.   FINDINGS: Right knee:   No acute fracture or dislocation. There is a small to moderate-sized joint effusion. Moderate tricompartmental degenerative changes with joint space narrowing and marginal osteophytosis. Vascular calcifications are noted.   Left knee:   No acute fracture or dislocation. There is a small joint effusion. Moderate tricompartmental degenerative changes with joint space narrowing and marginal osteophytosis. Vascular calcifications  are noted.   IMPRESSION: 1. No acute osseous abnormality. 2. 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  Narrative & Impression  CLINICAL DATA:  Chronic bilateral knee pain and swelling.   EXAM: RIGHT KNEE - COMPLETE 4+ VIEW; LEFT KNEE - COMPLETE 4+ VIEW   COMPARISON:  Right knee radiographs dated 12/26/2021.   FINDINGS: Right knee:   No acute fracture or dislocation. There is a small to moderate-sized joint effusion. Moderate tricompartmental degenerative changes with joint space narrowing and marginal osteophytosis. Vascular calcifications are noted.   Left knee:   No acute fracture or dislocation. There is a small joint effusion. Moderate tricompartmental degenerative changes with joint space narrowing and marginal osteophytosis. Vascular calcifications are noted.   IMPRESSION: 1. No acute osseous abnormality. 2. Moderate  tricompartmental degenerative changes of the bilateral knees with small to moderate-sized knee joint effusions.    Narrative & Impression CLINICAL DATA:  Right hip pain.   EXAM: DG HIP (WITH OR WITHOUT PELVIS) 2-3V RIGHT   COMPARISON:  None Available.   FINDINGS: No acute fracture or dislocation. The bones are osteopenic. Cystic changes of the right femoral head and neck measure up to 3.7 cm. Orthopedic referral is advised. Mild arthritic changes of the right hip. Total left hip arthroplasty. The soft tissues are unremarkable. Vascular calcifications.   IMPRESSION: 1. No acute fracture or dislocation. 2. Cystic changes of the right femoral head and neck. Orthopedic referral is advised.  Lower Venous DVT Study   Patient Name:  Carl Null Sr.  Date of Exam:   06/10/2023  Medical Rec #: [id number]            Accession #:    [id number]  Date of Birth: 1960-10-02             Patient Gender: M  Patient Age:   53 years  Exam Location:  [facility]  Procedure:      VAS US  LOWER EXTREMITY VENOUS (DVT)  Referring Phys: PRENTICE COMPTON    - PATIENT SURVEYS:  PSFS: THE PATIENT SPECIFIC FUNCTIONAL SCALE  Place score of 0-10 (0 = unable to perform activity and 10 = able to perform activity at the same level as before injury or problem)  Activity Date: eval 09/12/23 11/07/23 11/28/23  Getting in and out of car (anything lower than 20 inches)  0 0   2. Balance  3 5   3. Walking without a device  2 5   4.  Social activities  0 0   Total Score 1.25 2.5     Total Score = Sum of activity scores/number of activities  Minimally Detectable Change: 3 points (for single activity); 2 points (for average score)  [facility] (nd). The Patient Specific Functional Scale . Retrieved from [email or web address]   COGNITION: Overall cognitive status: Within functional limits for tasks assessed     SENSATION: Hx of  neuropathy even before fall/surgery     LOWER EXTREMITY MMT:  MMT Right eval Left eval Right 10/24/23 Left 10/24/23 RT/Left 10/31/23 RT/L 11/05/23 RT/L 11/19/23 RT/L 11/28/23 R/L 12/17/23 R/L  Hip flexion 3- 3+ 3 3+ 3/3+ 3+/4 4-/4+ 4-/4+ 3+/4 3+/4  Hip extension            Hip abduction            Hip adduction            Hip internal rotation  Hip external rotation            Knee flexion 3- 4+ 4- 5 4/5 4/5 5/5  5/5 5/5  Knee extension 3- 5 3- 5 3-/5 3-/5 3/5 3p!/5 3+ *P/4- 4-/4  Ankle dorsiflexion 4 5 5 5         Ankle plantarflexion            Ankle inversion            Ankle eversion             (Blank rows = not tested)    GAIT: Distance walked: in clinic distances  Assistive device utilized: Environmental Consultant - 2 wheeled Level of assistance: Modified independence Comments: slow but steady with RW                                                                                                                                TREATMENT DATE:   12/24/23 Nustep L 6 LE only MMT -see chart HS curls 35# 2 sets 10 SL 10 x each 15# Leg press ( cued to work = pressure BIL) 70# 10 x 3 sets LAQ 3# 3 sets 10 cued for full TKE Mini squats with RW and resisted TKE 2 sets 10 6 inch step up 10 x each with UE work on full [employer] ball core stab ex- seated and standing  12/19/23 Leg press ( cued to work = pressure BIL)60 # 10 , 70# 10 x LAQ 3# 2x10 HS curls green 2x10 STS elevated mat 2 sets 5 with cuing to lean fwd and push thru legs and bend knees to sit vs lean back and brace with back of leg Nustep L 6 LE only 6 inch step up 10 x each with UE work on full [employer] ball core stab ex- seated and standing    12/17/23: Nustep Lvl 5 for 7 min MMTs of the BLE (see above for results) Sit>Stand from elevated surface 2 x 8 Seated Trunk Rotations with Red Medball 2 x 10 each side Modified Sit Up edge of plinth 2 x 10 Seated Marches with green band around feet 2 x 8 each Seated  Clamshells with green Tband 2 x 12  11/28/23 Nustep L 5 7 min LE Only S2S form elevated mat with UE 2x10 Leg press 50lb 2x10 LAQ 2lb 2x10 HS curls green 2x10   11/21/23 30# resited gait fwd 5 x min A Nustep L 5 LE only Leg pres 40lb 10x 50# 2 sets 10 HS curls 35# 10 x BIL. LLE 25# 10 x, RT LE 20# 10 x Knee Ext 5# 10 x LLE, unable to do RT STS 25in 2 sets 5 no UE, CG-min A with cuing for eccentric lowering LAQ RT LE 2 sets 10  107/25 NuStep L 5 x 6 min Goals Leg pres 40lb 3x10 6in step ups 2 rails x10 each 4in lateral step ups x10  each 2 rails LAQ 2x10  HS curls green x10   11/07/23 NuStep L 5 x 7 min Sit to stand 2x10 form elevated surface with UE use Leg press 40lb 2x10 6in step ups with 2 rails x10 Heel raises 2x10 LAQ 2lb 2x10  11/05/23 NuStep L5 x 6 min Goals  Sit to stands w/ UE from elevated mat 2x10 6in step ups with UE assit x10 on L, x5 on R  10/31/23 Nustep L 5 STGs assessed including MMT Leg Press 40# 2 sets 10, SL 10 x BIL 20#, AA with RT to start 6 inch step up with UE support 10 x BIL CG- min A 4 inch step down with UE 10 xBIL CG-min A STS no hands CG-min A with cuing 28 in , could not do lower 10 x  10/29/23 Nustep L 5 BIL ankle red tband 4 way 10x 2 sets  Green tband HS curl 2 sets 10 LAQ 3# 2 sets 10 Standing with RW 3# 10 x SL hip flex,ext and abd,marching and HS curl Leg press 40# 2 sets 10. 20# SL 10 x BIL , assist to start on RT Green tband HS curl 2 sets 10   PATIENT EDUCATION:  Education details: exam findings, POC Person educated: Patient Education method: Explanation Education comprehension: verbalized understanding, returned demonstration, and needs further education  HOME EXERCISE PROGRAM: Educ on getting ankle wts, adjustable up to 5 is rec as we used 3 #- okayed doing seated and standing ex with RW  ASSESSMENT:  CLINICAL IMPRESSION:   Pnt arrives verb knee pain and getting inj tomorrow. Denies any falls  since last session. Assessed and documented STG and LTG. Focus session on strength and cued for full ROM esp TKE and equal wt on BIL LE esp on leg press.   Patient is a 63 y.o. M who was seen today for physical therapy evaluation and treatment for S14.109S (ICD-10-CM) - Spinal cord injury, cervical region, sequela (HCC) Pt tolerated a initial progression to therapy well. RLE is significantly weaker than R. Pt had log legs and a relatively short torso making it difficult to stand from low surfaces in conjunction with weak LE. Decrease coordination with side steps. No pain during session. Limited ROM with RLE during isolated exercises.  Will make every effort to improve function and work towards goals.   OBJECTIVE IMPAIRMENTS: Abnormal gait, decreased activity tolerance, decreased balance, decreased coordination, decreased knowledge of use of DME, decreased mobility, difficulty walking, decreased strength, postural dysfunction, and pain.   ACTIVITY LIMITATIONS: standing, squatting, stairs, transfers, bed mobility, toileting, dressing, and locomotion level  PARTICIPATION LIMITATIONS: shopping, community activity, and yard work  PERSONAL FACTORS: Age, Behavior pattern, Education, Fitness, Past/current experiences, Profession, Social background, and Time since onset of injury/illness/exacerbation are also affecting patient's functional outcome.   REHAB POTENTIAL: Good  CLINICAL DECISION MAKING: Evolving/moderate complexity  EVALUATION COMPLEXITY: Moderate   GOALS: Goals reviewed with patient? No  SHORT TERM GOALS: Target date: 10/24/2023   Will be compliant with appropriate progressive HEP  Baseline: Goal status: 10/08/23 MET  2.  Will be able to consistently perform functional STS tranfers from surfaces 20 inches or less  Baseline:  Goal status:10/08/23 on going  10/31/23 progressing still difficult for pt, 11/05/23 progressing still difficult for pt uses his UE, ongoing 11/19/23, Ongoing  11/28/23  Ongoing 12/24/23- still really struggles with this   3.  Will be able to ambulate at least 338ft with quad cane no more than light  MinA  Baseline:  Goal status: 10/08/23 progressing  and 10/31/23  12/24/23 progressing  4.  MMT to be at least 3+/5 in all tested groups R LE  Baseline:  Goal status: ongoing 10/24/23  and 10/31/23, Progressing 11/19/23, Progressing 11/28/23  MET 12/24/23    LONG TERM GOALS: Target date: 02/11/24   R LE MMT to be at least 4-/5 in all tested groups  Baseline:  Goal status: ongoing 10/24/23, Ongoing 11/05/23, Ongoing 11/19/23; progressing 12/17/23  2.  Will score at least 18 on DGI with no device to show reduced fall risk  Baseline:  Goal status: INITIAL  3.  Will be able to reciprocally ascend/descend steps without difficulty and U rail  Baseline:  Goal status: Ongoing 10/24/23, 11/28/23 and 12/24/23 needs BIL  4.  Will be able to ambulate community distances with LRAD, Mod(I) to allow for improved community access and participating in social activities  Baseline:  Goal status: Progressing 11/05/23  5.  Will be compliant with appropriate gym based exercise program by time of DC  Baseline:  Goal status: reports going to gym some with his son , using bicycle., Progressing 11/05/23 goes with his son; hasn't been recently 12/17/23  PLAN:  PT FREQUENCY: 2x/week  PT DURATION: 12 weeks  PLANNED INTERVENTIONS: 97750- Physical Performance Testing, 97110-Therapeutic exercises, 97530- Therapeutic activity, 97112- Neuromuscular re-education, 97535- Self Care, [id number]- Manual therapy, and 97116- Gait training  PLAN FOR NEXT SESSION: progress func strength, gait and balance.  Angie Mckinley Adelstein PTA 12/24/23 8:00 AM

## 2023-12-26 ENCOUNTER — Ambulatory Visit: Admitting: Physical Therapy

## 2023-12-26 ENCOUNTER — Encounter: Payer: Self-pay | Admitting: Physical Therapy

## 2023-12-26 DIAGNOSIS — R262 Difficulty in walking, not elsewhere classified: Secondary | ICD-10-CM

## 2023-12-26 DIAGNOSIS — R2681 Unsteadiness on feet: Secondary | ICD-10-CM

## 2023-12-26 DIAGNOSIS — M6281 Muscle weakness (generalized): Secondary | ICD-10-CM

## 2023-12-26 DIAGNOSIS — R296 Repeated falls: Secondary | ICD-10-CM

## 2023-12-26 NOTE — Therapy (Signed)
 OUTPATIENT PHYSICAL THERAPY LOWER EXTREMITY TREATMENT    Patient Name: Carl Keim Sr. MRN: [id number] DOB:[birth year], 63 y.o., male Today's Date: 12/26/2023  END OF SESSION:  PT End of Session - 12/26/23 1017     Visit Number 18    Date for Recertification  02/11/24    PT Start Time 1015    PT Stop Time 1100    PT Time Calculation (min) 45 min    Activity Tolerance Patient tolerated treatment well    Behavior During Therapy [facility] for tasks assessed/performed           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    Parathyroid  abnormality    ectopic parathyroid  gland   Presence of arteriovenous fistula for hemodialysis, primary    RUE PER PT RLE   Refusal of blood product    NO WHOLE BLOOD PROUCTS   Renal cell carcinoma (HCC)    s/p hand assisted laparoscopic bilateral nephrectomies 11/29/17, + RCC left   Secondary hyperparathyroidism    Seizures (HCC)    one episode in past, due to elevated Potassium 08/02/20- at least 4 years ago   Sleep apnea    doesn't use CPAP anymore since weight loss   Stroke [facility])    no residual   Past Surgical History:  Procedure Laterality Date   ABDOMINAL AORTOGRAM W/LOWER EXTREMITY N/A 12/09/2023   Procedure: ABDOMINAL AORTOGRAM W/LOWER EXTREMITY;  Surgeon: Sheree Penne Bruckner, MD;  Location: [facility] INVASIVE CV LAB;  Service: Cardiovascular;  Laterality: N/A;   ANTERIOR CERVICAL CORPECTOMY N/A 05/25/2023    Procedure: ANTERIOR CERVICAL CORPECTOMY CERVICAL FOUR, CERVICAL THREE - CERVICAL FIVE FUSION;  Surgeon: [name] Shove, MD;  Location: [facility] OR;  Service: Neurosurgery;  Laterality: N/A;   AV FISTULA PLACEMENT Right    right arm   BIOPSY  08/01/2019   Procedure: BIOPSY;  Surgeon: Kristie Lamprey, MD;  Location: [facility] ENDOSCOPY;  Service: Endoscopy;;   BIOPSY  11/18/2020   Procedure: BIOPSY;  Surgeon: Stacia Glendia BRAVO, MD;  Location: [facility] ENDOSCOPY;  Service: Gastroenterology;;   BIOPSY  09/13/2021   Procedure: BIOPSY;  Surgeon: Wilhelmenia Aloha Raddle., MD;  Location: [facility] ENDOSCOPY;  Service: Gastroenterology;;   CATARACT EXTRACTION W/ INTRAOCULAR LENS  IMPLANT, BILATERAL     COLON SURGERY     COLONOSCOPY N/A 08/04/2015   Procedure: COLONOSCOPY;  Surgeon: Gordy CHRISTELLA Starch, MD;  Location: [facility] ENDOSCOPY;  Service: Endoscopy;  Laterality: N/A;   COLONOSCOPY  2017   JMP@ [facility]-good prep-mass -recall 1 yr   COLONOSCOPY N/A 09/13/2021   Procedure: COLONOSCOPY;  Surgeon: Mansouraty, Aloha Raddle., MD;  Location: [facility] ENDOSCOPY;  Service: Gastroenterology;  Laterality: N/A;   COLONOSCOPY WITH PROPOFOL  N/A 11/25/2020   Procedure: COLONOSCOPY WITH PROPOFOL ;  Surgeon: Federico Rosario BROCKS, MD;  Location: [facility] ENDOSCOPY;  Service: Gastroenterology;  Laterality: N/A;   COLONOSCOPY WITH PROPOFOL  N/A 09/23/2021   Procedure: COLONOSCOPY WITH PROPOFOL ;  Surgeon: San Sandor GAILS, DO;  Location: [facility] ENDOSCOPY;  Service: Gastroenterology;  Laterality: N/A;   ENTEROSCOPY N/A 09/23/2021   Procedure: ENTEROSCOPY;  Surgeon: San Sandor GAILS, DO;  Location: [facility] ENDOSCOPY;  Service: Gastroenterology;  Laterality: N/A;  Will place order for video capsule study as we may opt to place it during procedure   ESOPHAGOGASTRODUODENOSCOPY N/A 08/01/2019   Procedure: ESOPHAGOGASTRODUODENOSCOPY (EGD);  Surgeon: Kristie Lamprey, MD;  Location: [facility] ENDOSCOPY;  Service: Endoscopy;  Laterality: N/A;   ESOPHAGOGASTRODUODENOSCOPY N/A 11/18/2020   Procedure:  ESOPHAGOGASTRODUODENOSCOPY (EGD);  Surgeon: Stacia Glendia BRAVO, MD;  Location: [facility] ENDOSCOPY;  Service: Gastroenterology;  Laterality: N/A;   ESOPHAGOGASTRODUODENOSCOPY (EGD) WITH PROPOFOL  N/A 08/04/2019   Procedure: ESOPHAGOGASTRODUODENOSCOPY (EGD) WITH PROPOFOL ;  Surgeon: Leigh Elspeth SQUIBB, MD;  Location: [facility] ENDOSCOPY;  Service: Gastroenterology;  Laterality: N/A;   ESOPHAGOGASTRODUODENOSCOPY (EGD) WITH PROPOFOL  N/A 09/13/2021   Procedure: ESOPHAGOGASTRODUODENOSCOPY (EGD) WITH PROPOFOL ;  Surgeon: Wilhelmenia Aloha Raddle., MD;  Location: [facility] ENDOSCOPY;  Service: Gastroenterology;  Laterality: N/A;   GIVENS CAPSULE STUDY N/A 09/23/2021   Procedure: GIVENS CAPSULE STUDY;  Surgeon: San Sandor GAILS, DO;  Location: [facility] ENDOSCOPY;  Service: Gastroenterology;  Laterality: N/A;   graft left arm Left    for dialysis x 2. Removed   HOT HEMOSTASIS  11/18/2020   Procedure: HOT HEMOSTASIS (ARGON PLASMA COAGULATION/BICAP);  Surgeon: Stacia Glendia BRAVO, MD;  Location: [facility] ENDOSCOPY;  Service: Gastroenterology;;   HOT HEMOSTASIS N/A 09/23/2021   Procedure: HOT HEMOSTASIS (ARGON PLASMA COAGULATION/BICAP);  Surgeon: San Sandor GAILS, DO;  Location: [facility] ENDOSCOPY;  Service: Gastroenterology;  Laterality: N/A;   INSERTION OF DIALYSIS CATHETER     Rt chest   LAPAROSCOPIC RIGHT COLECTOMY N/A 08/05/2015   Procedure: LAPAROSCOPIC RIGHT COLECTOMY- ASCENDING;  Surgeon: Jina Nephew, MD;  Location: [facility] OR;  Service: General;  Laterality: N/A;   LOWER EXTREMITY ANGIOGRAPHY N/A 12/09/2023   Procedure: Lower Extremity Angiography;  Surgeon: Sheree Penne Bruckner, MD;  Location: [facility] INVASIVE CV LAB;  Service: Cardiovascular;  Laterality: N/A;   LOWER EXTREMITY INTERVENTION N/A 12/09/2023   Procedure: LOWER EXTREMITY INTERVENTION;  Surgeon: Sheree Penne Bruckner, MD;  Location: [facility] INVASIVE CV LAB;  Service: Cardiovascular;  Laterality: N/A;   MASS EXCISION Left 05/28/2019   Procedure: EXCISION SOFT TISSUE MASS LEFT  SHOULDER;  Surgeon: Eletha Boas, MD;  Location: [facility] ORS;  Service: General;  Laterality: Left;   NEPHRECTOMY Bilateral    PARATHYROIDECTOMY N/A 06/12/2016   Procedure: TOTAL PARATHYROIDECTOMY WITH AUTOTRANSPLANTATION TO LEFT FOREARM;  Surgeon: Boas Eletha, MD;  Location: [facility] OR;  Service: General;  Laterality: N/A;   PARATHYROIDECTOMY N/A 10/23/2016   Procedure: PARATHYROIDECTOMY;  Surgeon: Eletha Boas, MD;  Location: [facility] OR;  Service: General;  Laterality: N/A;   REVERSE SHOULDER ARTHROPLASTY Right 08/24/2020   Procedure: REVERSE SHOULDER ARTHROPLASTY;  Surgeon: Cristy Bonner DASEN, MD;  Location: [facility] OR;  Service: Orthopedics;  Laterality: Right;   REVISON OF ARTERIOVENOUS FISTULA Right 07/16/2017   Procedure: REVISION OF ARTERIOVENOUS FISTULA  Right ARM;  Surgeon: Sheree Penne Bruckner, MD;  Location: [facility] OR;  Service: Vascular;  Laterality: Right;   TOTAL HIP ARTHROPLASTY Left 11/14/2020   Procedure: LEFT TOTAL HIP ARTHROPLASTY ANTERIOR APPROACH;  Surgeon: [name] Kay HERO, MD;  Location: [facility] OR;  Service: Orthopedics;  Laterality: Left;   UPPER GASTROINTESTINAL ENDOSCOPY  2021   @ [facility]   Patient Active Problem List   Diagnosis Date Noted  Right knee buckling 11/15/2023   Primary osteoarthritis of right knee 11/15/2023   Abnormality of gait 11/15/2023   Primary osteoarthritis of left knee 10/03/2023   Right hip pain 07/14/2023   Coping style affecting medical condition 06/05/2023   Spinal cord injury, cervical region, sequela 05/30/2023   Cervical vertebral fracture (HCC) 05/25/2023   History of GI bleed 05/25/2023   C4 cervical fracture (HCC) 05/25/2023   Duodenitis    Gastritis and gastroduodenitis    Ischemic colitis    Bright red blood per rectum    ABLA (acute blood loss anemia)    Gastric ulcer    Duodenal ulcer    Diverticulosis of colon without hemorrhage    GI bleed 09/17/2021   Acute upper GI bleeding 09/11/2021   Mucosal abnormality of esophagus    Mucosal abnormality of  stomach    Mucosal abnormality of duodenum    Upper GI bleed    Left hip pain 11/16/2020   Type 2 diabetes mellitus with other specified complication (HCC) 11/14/2020   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/14/2020   Primary osteoarthritis of left hip 10/07/2020   Status post reverse total arthroplasty of right shoulder 08/24/2020   Diverticulitis 03/08/2020   ESRD (end stage renal disease) (HCC) 03/08/2020   Polyneuropathy in diseases classified elsewhere 03/08/2020   Pure hypercholesterolemia 03/08/2020   Sacral back pain 03/08/2020   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 11/10/2019   Acute gastric ulcer with hemorrhage    Acute GI bleeding 07/31/2019   Hypotension of hemodialysis 07/31/2019   Diverticulitis large intestine 07/31/2019   Mass of soft tissue of shoulder 05/26/2019   Awaiting organ transplant status 03/18/2019   Finding of cocaine in blood 02/25/2019   Anaphylactic shock, unspecified, sequela 11/21/2018   Hypercalcemia 10/28/2018   Diarrhea, unspecified 08/08/2018   Hyperkalemia 03/12/2018   Renal cell carcinoma of left kidney (HCC) 12/24/2017   Fluid overload, unspecified 07/06/2017   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   Other specified disorders of kidney and ureter 11/22/2016   Secondary hyperparathyroidism of renal origin 10/23/2016   Headache disorder 08/21/2016   TIA (transient ischemic attack)    Essential hypertension    Seizure (HCC)    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    Lung nodule    Diverticulitis of cecum 07/21/2015   Diverticulitis of large intestine without perforation or abscess without bleeding    Right lower quadrant pain    Obstructive sleep apnea 02/08/2015   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   Diverticulosis 07/13/2014   Lower GI bleed 07/12/2014   Refusal of blood transfusions as patient is Jehovah's Witness    Parotid mass    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 06/13/2014   HLD (hyperlipidemia)    PRES (posterior reversible encephalopathy syndrome)    History of CVA (cerebrovascular accident) 05/23/2014   Anemia of chronic disease 05/23/2014   GERD (gastroesophageal reflux disease) 12/09/2012   Gastrointestinal bleeding 11/12/2012   Melena 11/12/2012   Other psychoactive substance dependence, uncomplicated (HCC) 10/08/2012   Iron deficiency anemia, unspecified 06/10/2012   Moderate protein-calorie malnutrition 07/29/2009   Coagulation defect, unspecified 08/29/2006   Type 2 diabetes mellitus with diabetic peripheral angiopathy without gangrene (HCC) 08/29/2006   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 stage renal disease (HCC) 08/02/2006    PCP: [name] MD   REFERRING PROVIDER: Debby Fidela CROME, NP  REFERRING DIAG:  Diagnosis  S14.109S (ICD-10-CM) -  Spinal cord injury, cervical region, sequela (HCC)    THERAPY DIAG:  Difficulty in walking, not elsewhere classified  Muscle weakness (generalized)  Unsteadiness on feet  Repeated falls  Rationale for Evaluation and Treatment: Rehabilitation  ONSET DATE:  ACDF 05/25/23  SUBJECTIVE:   SUBJECTIVE STATEMENT:  So, So Knee injection rescheduled for Tuesday  PERTINENT HISTORY: See above   ACDF C3-C5 fusion, anterior cervical corpectomy C3-C4  PAIN:  Are you having pain? 5/10 R knee  PRECAUTIONS:  Precautions:  Precautions Precautions: Fall, Cervical, Other (comment) Recall of Precautions/Restrictions: Impaired Precaution/Restrictions Comments: Verbally reviewed cervical precautions. Required Braces or Orthoses: Cervical Brace Cervical Brace: Soft collar, At all times Restrictions Weight Bearing Restrictions Per Provider Order: No General: Pain: No c/o pain.   RED FLAGS: None   WEIGHT BEARING RESTRICTIONS: No  FALLS:  Has patient fallen in last 6 months? Yes. Number of falls 3- one that led to surgery, then 2  after getting home from the hospital  LIVING ENVIRONMENT: Lives with: lives with their son Lives in: House/apartment Stairs: ground floor apt  Has following equipment at home: Single point cane, Environmental Consultant - 2 wheeled, and Environmental Consultant - 4 wheeled  OCCUPATION: retired- used to pressure wash   PLOF: Independent, Independent with basic ADLs, Independent with gait, and Independent with transfers  PATIENT GOALS: get back to normal ability, drive convertible again   NEXT MD VISIT: Dr. Cornelio 11/15/23  OBJECTIVE:  Note: Objective measures were completed at Evaluation unless otherwise noted.  DIAGNOSTIC FINDINGS:    CLINICAL DATA:  63 year old male with altered mental status.   EXAM: CT HEAD WITHOUT CONTRAST   TECHNIQUE: Contiguous axial images were obtained from the base of the skull through the vertex without intravenous contrast.   RADIATION DOSE REDUCTION: This exam was performed according to the departmental dose-optimization program which includes automated exposure control, adjustment of the mA and/or kV according to patient size and/or use of iterative reconstruction technique.   COMPARISON:  Brain MRI and head CT 05/25/2023.   FINDINGS: Brain:   Unusual extensive, diffuse pachymeningeal calcification which has been present since at least 2013. Stable cerebral volume. No midline shift, ventriculomegaly, mass effect, evidence of mass lesion, intracranial hemorrhage or evidence of cortically based acute infarction. Small area of chronic encephalomalacia in the medial left occipital lobe redemonstrated. Stable gray-white matter differentiation throughout the brain.   Vascular: Chronic severe pachymeningeal calcification.   Skull: Appears stable.  No acute osseous abnormality identified.   Sinuses/Orbits: Continued left middle ear and mastoid opacification. Other Visualized paranasal sinuses and mastoids are stable and well aerated.   Other: Anterior left scalp soft tissue  injury has regressed since earlier in the month. Orbits soft tissues appears stable.   IMPRESSION: 1. No acute intracranial abnormality. 2. 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 3. Ongoing left middle ear and mastoid opacification. No acute osseous abnormality identified. 4. Regressed scalp soft tissue injury since 05/25/2023.  CLINICAL DATA:  Chronic bilateral knee pain and swelling.   EXAM: RIGHT KNEE - COMPLETE 4+ VIEW; LEFT KNEE - COMPLETE 4+ VIEW   COMPARISON:  Right knee radiographs dated 12/26/2021.   FINDINGS: Right knee:   No acute fracture or dislocation. There is a small to moderate-sized joint effusion. Moderate tricompartmental degenerative changes with joint space narrowing and marginal osteophytosis. Vascular calcifications are noted.   Left knee:   No acute fracture or dislocation. There is a small joint effusion. Moderate tricompartmental degenerative changes with joint space  narrowing and marginal osteophytosis. Vascular calcifications are noted.   IMPRESSION: 1. No acute osseous abnormality. 2. 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  Narrative & Impression  CLINICAL DATA:  Chronic bilateral knee pain and swelling.   EXAM: RIGHT KNEE - COMPLETE 4+ VIEW; LEFT KNEE - COMPLETE 4+ VIEW   COMPARISON:  Right knee radiographs dated 12/26/2021.   FINDINGS: Right knee:   No acute fracture or dislocation. There is a small to moderate-sized joint effusion. Moderate tricompartmental degenerative changes with joint space narrowing and marginal osteophytosis. Vascular calcifications are noted.   Left knee:   No acute fracture or dislocation. There is a small joint effusion. Moderate tricompartmental degenerative changes with joint space narrowing and marginal osteophytosis. Vascular calcifications  are noted.   IMPRESSION: 1. No acute osseous abnormality. 2. 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    Narrative & Impression CLINICAL DATA:  Right hip pain.   EXAM: DG HIP (WITH OR WITHOUT PELVIS) 2-3V RIGHT   COMPARISON:  None Available.   FINDINGS: No acute fracture or dislocation. The bones are osteopenic. Cystic changes of the right femoral head and neck measure up to 3.7 cm. Orthopedic referral is advised. Mild arthritic changes of the right hip. Total left hip arthroplasty. The soft tissues are unremarkable. Vascular calcifications.   IMPRESSION: 1. No acute fracture or dislocation. 2. Cystic changes of the right femoral head and neck. Orthopedic referral is advised.  Lower Venous DVT Study   Patient Name:  Yousaf Sainato Sr.  Date of Exam:   06/10/2023  Medical Rec #: [id number]            Accession #:    [id number]  Date of Birth: [birth year]             Patient Gender: M  Patient Age:   51 years  Exam Location[facility]  [facility]  Procedure:      VAS US  LOWER EXTREMITY VENOUS (DVT)  Referring Phys: PRENTICE COMPTON    - PATIENT SURVEYS:  PSFS: THE PATIENT SPECIFIC FUNCTIONAL SCALE  Place score of 0-10 (0 = unable to perform activity and 10 = able to perform activity at the same level as before injury or problem)  Activity Date: eval 09/12/23 11/07/23 11/28/23  Getting in and out of car (anything lower than 20 inches)  0 0   2. Balance  3 5   3. Walking without a device  2 5   4.  Social activities  0 0   Total Score 1.25 2.5     Total Score = Sum of activity scores/number of activities  Minimally Detectable Change: 3 points (for single activity); 2 points (for average score)  [facility] (nd). The Patient Specific Functional Scale . Retrieved from [email or web address]   COGNITION: Overall cognitive status:  Within functional limits for tasks assessed     SENSATION: Hx of neuropathy even before fall/surgery     LOWER EXTREMITY MMT:  MMT Right eval Left eval Right 10/24/23 Left 10/24/23 RT/Left 10/31/23 RT/L 11/05/23 RT/L 11/19/23 RT/L 11/28/23 R/L 12/17/23 R/L 12/24/23  Hip flexion 3- 3+ 3 3+ 3/3+ 3+/4 4-/4+ 4-/4+ 3+/4 3+/4  Hip extension            Hip abduction            Hip adduction            Hip internal  rotation            Hip external rotation            Knee flexion 3- 4+ 4- 5 4/5 4/5 5/5  5/5 5/5  Knee extension 3- 5 3- 5 3-/5 3-/5 3/5 3p!/5 3+ *P/4- 4-/4  Ankle dorsiflexion 4 5 5 5         Ankle plantarflexion            Ankle inversion            Ankle eversion             (Blank rows = not tested)    GAIT: Distance walked: in clinic distances  Assistive device utilized: Environmental Consultant - 2 wheeled Level of assistance: Modified independence Comments: slow but steady with RW                                                                                                                                TREATMENT DATE:  12/26/23 Nustep L 6 Leg press ( cued to work = pressure BIL) 70# 10 x 3 sets  SL 20lb x10 each, Assist needed w/ RLE Standing march 2lb in RW 2x10 LAQ 2lb cues to for full Fom 2x10 4in step ups x10 each 2 rails  6in x 10  12/24/23 Nustep L 6 LE only MMT -see chart HS curls 35# 2 sets 10 SL 10 x each 15# Leg press ( cued to work = pressure BIL) 70# 10 x 3 sets LAQ 3# 3 sets 10 cued for full TKE Mini squats with RW and resisted TKE 2 sets 10 6 inch step up 10 x each with UE work on full [employer] ball core stab ex- seated and standing  12/19/23 Leg press ( cued to work = pressure BIL)60 # 10 , 70# 10 x LAQ 3# 2x10 HS curls green 2x10 STS elevated mat 2 sets 5 with cuing to lean fwd and push thru legs and bend knees to sit vs lean back and brace with back of leg Nustep L 6 LE only 6 inch step up 10 x each with UE work on full [employer] ball  core stab ex- seated and standing    12/17/23: Nustep Lvl 5 for 7 min MMTs of the BLE (see above for results) Sit>Stand from elevated surface 2 x 8 Seated Trunk Rotations with Red Medball 2 x 10 each side Modified Sit Up edge of plinth 2 x 10 Seated Marches with green band around feet 2 x 8 each Seated Clamshells with green Tband 2 x 12  11/28/23 Nustep L 5 7 min LE Only S2S form elevated mat with UE 2x10 Leg press 50lb 2x10 LAQ 2lb 2x10 HS curls green 2x10   11/21/23 30# resited gait fwd 5 x min A Nustep L 5 LE only Leg pres 40lb 10x 50# 2 sets 10 HS curls 35# 10 x  BIL. LLE 25# 10 x, RT LE 20# 10 x Knee Ext 5# 10 x LLE, unable to do RT STS 25in 2 sets 5 no UE, CG-min A with cuing for eccentric lowering LAQ RT LE 2 sets 10  107/25 NuStep L 5 x 6 min Goals Leg pres 40lb 3x10 6in step ups 2 rails x10 each 4in lateral step ups x10 each 2 rails LAQ 2x10  HS curls green x10   11/07/23 NuStep L 5 x 7 min Sit to stand 2x10 form elevated surface with UE use Leg press 40lb 2x10 6in step ups with 2 rails x10 Heel raises 2x10 LAQ 2lb 2x10  11/05/23 NuStep L5 x 6 min Goals  Sit to stands w/ UE from elevated mat 2x10 6in step ups with UE assit x10 on L, x5 on R  10/31/23 Nustep L 5 STGs assessed including MMT Leg Press 40# 2 sets 10, SL 10 x BIL 20#, AA with RT to start 6 inch step up with UE support 10 x BIL CG- min A 4 inch step down with UE 10 xBIL CG-min A STS no hands CG-min A with cuing 28 in , could not do lower 10 x  10/29/23 Nustep L 5 BIL ankle red tband 4 way 10x 2 sets  Green tband HS curl 2 sets 10 LAQ 3# 2 sets 10 Standing with RW 3# 10 x SL hip flex,ext and abd,marching and HS curl Leg press 40# 2 sets 10. 20# SL 10 x BIL , assist to start on RT Green tband HS curl 2 sets 10   PATIENT EDUCATION:  Education details: exam findings, POC Person educated: Patient Education method: Explanation Education comprehension: verbalized  understanding, returned demonstration, and needs further education  HOME EXERCISE PROGRAM: Educ on getting ankle wts, adjustable up to 5 is rec as we used 3 #- okayed doing seated and standing ex with RW  ASSESSMENT:  CLINICAL IMPRESSION:   Again Pt arrives verb knee pain, he stated that he had to push knee injections to next Tuesday due to dialysis running over. Focus session on strength and cued for full ROM esp TKE and equal wt on BIL LE esp on leg press. CGA needed with step ups. Cue to increase hip and knee flexion with standing marches.   Patient is a 63 y.o. M who was seen today for physical therapy evaluation and treatment for S14.109S (ICD-10-CM) - Spinal cord injury, cervical region, sequela (HCC) Pt tolerated a initial progression to therapy well. RLE is significantly weaker than R. Pt had log legs and a relatively short torso making it difficult to stand from low surfaces in conjunction with weak LE. Decrease coordination with side steps. No pain during session. Limited ROM with RLE during isolated exercises.  Will make every effort to improve function and work towards goals.   OBJECTIVE IMPAIRMENTS: Abnormal gait, decreased activity tolerance, decreased balance, decreased coordination, decreased knowledge of use of DME, decreased mobility, difficulty walking, decreased strength, postural dysfunction, and pain.   ACTIVITY LIMITATIONS: standing, squatting, stairs, transfers, bed mobility, toileting, dressing, and locomotion level  PARTICIPATION LIMITATIONS: shopping, community activity, and yard work  PERSONAL FACTORS: Age, Behavior pattern, Education, Fitness, Past/current experiences, Profession, Social background, and Time since onset of injury/illness/exacerbation are also affecting patient's functional outcome.   REHAB POTENTIAL: Good  CLINICAL DECISION MAKING: Evolving/moderate complexity  EVALUATION COMPLEXITY: Moderate   GOALS: Goals reviewed with patient? No  SHORT  TERM GOALS: Target date: 10/24/2023   Will be compliant  with appropriate progressive HEP  Baseline: Goal status: 10/08/23 MET  2.  Will be able to consistently perform functional STS tranfers from surfaces 20 inches or less  Baseline:  Goal status:10/08/23 on going  10/31/23 progressing still difficult for pt, 11/05/23 progressing still difficult for pt uses his UE, ongoing 11/19/23, Ongoing 11/28/23  Ongoing 12/24/23- still really struggles with this   3.  Will be able to ambulate at least 380ft with quad cane no more than light MinA  Baseline:  Goal status: 10/08/23 progressing  and 10/31/23  12/24/23 progressing  4.  MMT to be at least 3+/5 in all tested groups R LE  Baseline:  Goal status: ongoing 10/24/23  and 10/31/23, Progressing 11/19/23, Progressing 11/28/23  MET 12/24/23    LONG TERM GOALS: Target date: 02/11/24   R LE MMT to be at least 4-/5 in all tested groups  Baseline:  Goal status: ongoing 10/24/23, Ongoing 11/05/23, Ongoing 11/19/23; progressing 12/17/23  2.  Will score at least 18 on DGI with no device to show reduced fall risk  Baseline:  Goal status: INITIAL  3.  Will be able to reciprocally ascend/descend steps without difficulty and U rail  Baseline:  Goal status: Ongoing 10/24/23, 11/28/23 and 12/24/23 needs BIL  4.  Will be able to ambulate community distances with LRAD, Mod(I) to allow for improved community access and participating in social activities  Baseline:  Goal status: Progressing 11/05/23  5.  Will be compliant with appropriate gym based exercise program by time of DC  Baseline:  Goal status: reports going to gym some with his son , using bicycle., Progressing 11/05/23 goes with his son; hasn't been recently 12/17/23  PLAN:  PT FREQUENCY: 2x/week  PT DURATION: 12 weeks  PLANNED INTERVENTIONS: 97750- Physical Performance Testing, 97110-Therapeutic exercises, 97530- Therapeutic activity, 97112- Neuromuscular re-education, 97535- Self Care, [id number]- Manual  therapy, and 97116- Gait training  PLAN FOR NEXT SESSION: progress func strength, gait and balance.  Angie Payseur PTA 12/26/23 10:18 AM

## 2023-12-31 ENCOUNTER — Ambulatory Visit: Admitting: Physical Therapy

## 2024-01-02 ENCOUNTER — Ambulatory Visit: Admitting: Physical Therapy

## 2024-01-02 DIAGNOSIS — R262 Difficulty in walking, not elsewhere classified: Secondary | ICD-10-CM | POA: Diagnosis not present

## 2024-01-02 DIAGNOSIS — R2681 Unsteadiness on feet: Secondary | ICD-10-CM

## 2024-01-02 DIAGNOSIS — M6281 Muscle weakness (generalized): Secondary | ICD-10-CM

## 2024-01-02 NOTE — Therapy (Signed)
 OUTPATIENT PHYSICAL THERAPY LOWER EXTREMITY TREATMENT    Patient Name: Carl Geurin Sr. MRN: [id number] DOB:[birth year], 63 y.o., male Today's Date: 01/02/2024  END OF SESSION:  PT End of Session - 01/02/24 0851     Visit Number 19    Date for Recertification  02/11/24    Authorization Type [employer] Dual    Authorization Time Period 09/12/23 to 12/05/23    PT Start Time 0852    PT Stop Time 0930    PT Time Calculation (min) 38 min           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    Parathyroid  abnormality    ectopic parathyroid  gland   Presence of arteriovenous fistula for hemodialysis, primary    RUE PER PT RLE   Refusal of blood product    NO WHOLE BLOOD PROUCTS   Renal cell carcinoma (HCC)    s/p hand assisted laparoscopic bilateral nephrectomies 11/29/17, + RCC left   Secondary hyperparathyroidism    Seizures (HCC)    one episode in past, due to elevated Potassium 08/02/20- at least 4 years ago   Sleep apnea    doesn't use CPAP anymore since weight loss   Stroke [facility])    no residual   Past Surgical History:  Procedure Laterality Date   ABDOMINAL AORTOGRAM W/LOWER EXTREMITY N/A 12/09/2023   Procedure: ABDOMINAL AORTOGRAM W/LOWER EXTREMITY;  Surgeon: Sheree Penne Bruckner, MD;  Location: [facility] INVASIVE CV LAB;  Service: Cardiovascular;  Laterality: N/A;   ANTERIOR CERVICAL CORPECTOMY N/A 05/25/2023   Procedure: ANTERIOR CERVICAL  CORPECTOMY CERVICAL FOUR, CERVICAL THREE - CERVICAL FIVE FUSION;  Surgeon: [name] Shove, MD;  Location: [facility] OR;  Service: Neurosurgery;  Laterality: N/A;   AV FISTULA PLACEMENT Right    right arm   BIOPSY  08/01/2019   Procedure: BIOPSY;  Surgeon: Kristie Lamprey, MD;  Location: [facility] ENDOSCOPY;  Service: Endoscopy;;   BIOPSY  11/18/2020   Procedure: BIOPSY;  Surgeon: Stacia Glendia BRAVO, MD;  Location: [facility] ENDOSCOPY;  Service: Gastroenterology;;   BIOPSY  09/13/2021   Procedure: BIOPSY;  Surgeon: Wilhelmenia Aloha Raddle., MD;  Location: [facility] ENDOSCOPY;  Service: Gastroenterology;;   CATARACT EXTRACTION W/ INTRAOCULAR LENS  IMPLANT, BILATERAL     COLON SURGERY     COLONOSCOPY N/A 08/04/2015   Procedure: COLONOSCOPY;  Surgeon: Gordy CHRISTELLA Starch, MD;  Location: [facility] ENDOSCOPY;  Service: Endoscopy;  Laterality: N/A;   COLONOSCOPY  2017   JMP@ [facility]-good prep-mass -recall 1 yr   COLONOSCOPY N/A 09/13/2021   Procedure: COLONOSCOPY;  Surgeon: Mansouraty, Aloha Raddle., MD;  Location: [facility] ENDOSCOPY;  Service: Gastroenterology;  Laterality: N/A;   COLONOSCOPY WITH PROPOFOL  N/A 11/25/2020   Procedure: COLONOSCOPY WITH PROPOFOL ;  Surgeon: Federico Rosario BROCKS, MD;  Location: [facility] ENDOSCOPY;  Service: Gastroenterology;  Laterality: N/A;   COLONOSCOPY WITH PROPOFOL  N/A 09/23/2021   Procedure: COLONOSCOPY WITH PROPOFOL ;  Surgeon: San Sandor GAILS, DO;  Location: [facility] ENDOSCOPY;  Service: Gastroenterology;  Laterality: N/A;   ENTEROSCOPY N/A 09/23/2021   Procedure: ENTEROSCOPY;  Surgeon: San Sandor GAILS, DO;  Location: [facility] ENDOSCOPY;  Service: Gastroenterology;  Laterality: N/A;  Will place order for video capsule study as we may opt to place it during procedure   ESOPHAGOGASTRODUODENOSCOPY N/A 08/01/2019   Procedure: ESOPHAGOGASTRODUODENOSCOPY (EGD);  Surgeon: Kristie Lamprey, MD;  Location: [facility] ENDOSCOPY;  Service: Endoscopy;  Laterality: N/A;   ESOPHAGOGASTRODUODENOSCOPY N/A 11/18/2020   Procedure: ESOPHAGOGASTRODUODENOSCOPY (EGD);  Surgeon:  Stacia Glendia BRAVO, MD;  Location: [facility] ENDOSCOPY;  Service: Gastroenterology;  Laterality: N/A;   ESOPHAGOGASTRODUODENOSCOPY (EGD) WITH PROPOFOL  N/A 08/04/2019   Procedure: ESOPHAGOGASTRODUODENOSCOPY (EGD) WITH PROPOFOL ;  Surgeon: Leigh Elspeth SQUIBB, MD;  Location: [facility] ENDOSCOPY;  Service: Gastroenterology;  Laterality: N/A;   ESOPHAGOGASTRODUODENOSCOPY (EGD) WITH PROPOFOL  N/A 09/13/2021   Procedure: ESOPHAGOGASTRODUODENOSCOPY (EGD) WITH PROPOFOL ;  Surgeon: Wilhelmenia Aloha Raddle., MD;  Location: [facility] ENDOSCOPY;  Service: Gastroenterology;  Laterality: N/A;   GIVENS CAPSULE STUDY N/A 09/23/2021   Procedure: GIVENS CAPSULE STUDY;  Surgeon: San Sandor GAILS, DO;  Location: [facility] ENDOSCOPY;  Service: Gastroenterology;  Laterality: N/A;   graft left arm Left    for dialysis x 2. Removed   HOT HEMOSTASIS  11/18/2020   Procedure: HOT HEMOSTASIS (ARGON PLASMA COAGULATION/BICAP);  Surgeon: Stacia Glendia BRAVO, MD;  Location: [facility] ENDOSCOPY;  Service: Gastroenterology;;   HOT HEMOSTASIS N/A 09/23/2021   Procedure: HOT HEMOSTASIS (ARGON PLASMA COAGULATION/BICAP);  Surgeon: San Sandor GAILS, DO;  Location: [facility] ENDOSCOPY;  Service: Gastroenterology;  Laterality: N/A;   INSERTION OF DIALYSIS CATHETER     Rt chest   LAPAROSCOPIC RIGHT COLECTOMY N/A 08/05/2015   Procedure: LAPAROSCOPIC RIGHT COLECTOMY- ASCENDING;  Surgeon: Jina Nephew, MD;  Location: [facility] OR;  Service: General;  Laterality: N/A;   LOWER EXTREMITY ANGIOGRAPHY N/A 12/09/2023   Procedure: Lower Extremity Angiography;  Surgeon: Sheree Penne Bruckner, MD;  Location: [facility] INVASIVE CV LAB;  Service: Cardiovascular;  Laterality: N/A;   LOWER EXTREMITY INTERVENTION N/A 12/09/2023   Procedure: LOWER EXTREMITY INTERVENTION;  Surgeon: Sheree Penne Bruckner, MD;  Location: [facility] INVASIVE CV LAB;  Service: Cardiovascular;  Laterality: N/A;   MASS EXCISION Left 05/28/2019   Procedure: EXCISION SOFT TISSUE MASS LEFT SHOULDER;  Surgeon: Eletha Boas, MD;  Location:  [facility] ORS;  Service: General;  Laterality: Left;   NEPHRECTOMY Bilateral    PARATHYROIDECTOMY N/A 06/12/2016   Procedure: TOTAL PARATHYROIDECTOMY WITH AUTOTRANSPLANTATION TO LEFT FOREARM;  Surgeon: Boas Eletha, MD;  Location: [facility] OR;  Service: General;  Laterality: N/A;   PARATHYROIDECTOMY N/A 10/23/2016   Procedure: PARATHYROIDECTOMY;  Surgeon: Eletha Boas, MD;  Location: [facility] OR;  Service: General;  Laterality: N/A;   REVERSE SHOULDER ARTHROPLASTY Right 08/24/2020   Procedure: REVERSE SHOULDER ARTHROPLASTY;  Surgeon: Cristy Bonner DASEN, MD;  Location: [facility] OR;  Service: Orthopedics;  Laterality: Right;   REVISON OF ARTERIOVENOUS FISTULA Right 07/16/2017   Procedure: REVISION OF ARTERIOVENOUS FISTULA  Right ARM;  Surgeon: Sheree Penne Bruckner, MD;  Location: [facility] OR;  Service: Vascular;  Laterality: Right;   TOTAL HIP ARTHROPLASTY Left 11/14/2020   Procedure: LEFT TOTAL HIP ARTHROPLASTY ANTERIOR APPROACH;  Surgeon: [name] Kay HERO, MD;  Location: [facility] OR;  Service: Orthopedics;  Laterality: Left;   UPPER GASTROINTESTINAL ENDOSCOPY  2021   @ [facility]   Patient Active Problem List   Diagnosis Date Noted   Right  knee buckling 11/15/2023   Primary osteoarthritis of right knee 11/15/2023   Abnormality of gait 11/15/2023   Primary osteoarthritis of left knee 10/03/2023   Right hip pain 07/14/2023   Coping style affecting medical condition 06/05/2023   Spinal cord injury, cervical region, sequela 05/30/2023   Cervical vertebral fracture (HCC) 05/25/2023   History of GI bleed 05/25/2023   C4 cervical fracture (HCC) 05/25/2023   Duodenitis    Gastritis and gastroduodenitis    Ischemic colitis    Bright red blood per rectum    ABLA (acute blood loss anemia)    Gastric ulcer    Duodenal ulcer    Diverticulosis of colon without hemorrhage    GI bleed 09/17/2021   Acute upper GI bleeding 09/11/2021   Mucosal abnormality of esophagus    Mucosal abnormality of stomach    Mucosal abnormality of duodenum     Upper GI bleed    Left hip pain 11/16/2020   Type 2 diabetes mellitus with other specified complication (HCC) 11/14/2020   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/14/2020   Primary osteoarthritis of left hip 10/07/2020   Status post reverse total arthroplasty of right shoulder 08/24/2020   Diverticulitis 03/08/2020   ESRD (end stage renal disease) (HCC) 03/08/2020   Polyneuropathy in diseases classified elsewhere 03/08/2020   Pure hypercholesterolemia 03/08/2020   Sacral back pain 03/08/2020   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 11/10/2019   Acute gastric ulcer with hemorrhage    Acute GI bleeding 07/31/2019   Hypotension of hemodialysis 07/31/2019   Diverticulitis large intestine 07/31/2019   Mass of soft tissue of shoulder 05/26/2019   Awaiting organ transplant status 03/18/2019   Finding of cocaine in blood 02/25/2019   Anaphylactic shock, unspecified, sequela 11/21/2018   Hypercalcemia 10/28/2018   Diarrhea, unspecified 08/08/2018   Hyperkalemia 03/12/2018   Renal cell carcinoma of left kidney (HCC) 12/24/2017   Fluid overload, unspecified 07/06/2017   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   Other specified disorders of kidney and ureter 11/22/2016   Secondary hyperparathyroidism of renal origin 10/23/2016   Headache disorder 08/21/2016   TIA (transient ischemic attack)    Essential hypertension    Seizure (HCC)    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    Lung nodule    Diverticulitis of cecum 07/21/2015   Diverticulitis of large intestine without perforation or abscess without bleeding    Right lower quadrant pain    Obstructive sleep apnea 02/08/2015   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   Diverticulosis 07/13/2014   Lower GI bleed 07/12/2014   Refusal of blood transfusions as patient is Jehovah's Witness    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   HLD (hyperlipidemia)    PRES  (posterior reversible encephalopathy syndrome)    History of CVA (cerebrovascular accident) 05/23/2014   Anemia of chronic disease 05/23/2014   GERD (gastroesophageal reflux disease) 12/09/2012   Gastrointestinal bleeding 11/12/2012   Melena 11/12/2012   Other psychoactive substance dependence, uncomplicated (HCC) 10/08/2012   Iron deficiency anemia, unspecified 06/10/2012   Moderate protein-calorie malnutrition 07/29/2009   Coagulation defect, unspecified 08/29/2006   Type 2 diabetes mellitus with diabetic peripheral angiopathy without gangrene (HCC) 08/29/2006   Hypertensive chronic kidney disease with stage 5 chronic kidney disease or end stage renal disease (HCC) 08/02/2006    PCP: [name] MD   REFERRING PROVIDER: Debby Fidela CROME, NP  REFERRING DIAG:  Diagnosis  S14.109S (ICD-10-CM) - Spinal  cord injury, cervical region, sequela (HCC)    THERAPY DIAG:  Difficulty in walking, not elsewhere classified  Muscle weakness (generalized)  Unsteadiness on feet  Rationale for Evaluation and Treatment: Rehabilitation  ONSET DATE:  ACDF 05/25/23  SUBJECTIVE:   SUBJECTIVE STATEMENT:  sorry I am running late , opened tail gait on truck and fell back on hitch, hit on RT side  PERTINENT HISTORY: See above   ACDF C3-C5 fusion, anterior cervical corpectomy C3-C4  PAIN:  Are you having pain? 5/10 R knee  PRECAUTIONS:  Precautions:  Precautions Precautions: Fall, Cervical, Other (comment) Recall of Precautions/Restrictions: Impaired Precaution/Restrictions Comments: Verbally reviewed cervical precautions. Required Braces or Orthoses: Cervical Brace Cervical Brace: Soft collar, At all times Restrictions Weight Bearing Restrictions Per Provider Order: No General: Pain: No c/o pain.   RED FLAGS: None   WEIGHT BEARING RESTRICTIONS: No  FALLS:  Has patient fallen in last 6 months? Yes. Number of falls 3- one that led to surgery, then 2 after getting home from the  hospital  LIVING ENVIRONMENT: Lives with: lives with their son Lives in: House/apartment Stairs: ground floor apt  Has following equipment at home: Single point cane, Environmental Consultant - 2 wheeled, and Environmental Consultant - 4 wheeled  OCCUPATION: retired- used to pressure wash   PLOF: Independent, Independent with basic ADLs, Independent with gait, and Independent with transfers  PATIENT GOALS: get back to normal ability, drive convertible again   NEXT MD VISIT: Dr. Cornelio 11/15/23  OBJECTIVE:  Note: Objective measures were completed at Evaluation unless otherwise noted.  DIAGNOSTIC FINDINGS:    CLINICAL DATA:  63 year old male with altered mental status.   EXAM: CT HEAD WITHOUT CONTRAST   TECHNIQUE: Contiguous axial images were obtained from the base of the skull through the vertex without intravenous contrast.   RADIATION DOSE REDUCTION: This exam was performed according to the departmental dose-optimization program which includes automated exposure control, adjustment of the mA and/or kV according to patient size and/or use of iterative reconstruction technique.   COMPARISON:  Brain MRI and head CT 05/25/2023.   FINDINGS: Brain:   Unusual extensive, diffuse pachymeningeal calcification which has been present since at least 2013. Stable cerebral volume. No midline shift, ventriculomegaly, mass effect, evidence of mass lesion, intracranial hemorrhage or evidence of cortically based acute infarction. Small area of chronic encephalomalacia in the medial left occipital lobe redemonstrated. Stable gray-white matter differentiation throughout the brain.   Vascular: Chronic severe pachymeningeal calcification.   Skull: Appears stable.  No acute osseous abnormality identified.   Sinuses/Orbits: Continued left middle ear and mastoid opacification. Other Visualized paranasal sinuses and mastoids are stable and well aerated.   Other: Anterior left scalp soft tissue injury has regressed  since earlier in the month. Orbits soft tissues appears stable.   IMPRESSION: 1. No acute intracranial abnormality. 2. 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 3. Ongoing left middle ear and mastoid opacification. No acute osseous abnormality identified. 4. Regressed scalp soft tissue injury since 05/25/2023.  CLINICAL DATA:  Chronic bilateral knee pain and swelling.   EXAM: RIGHT KNEE - COMPLETE 4+ VIEW; LEFT KNEE - COMPLETE 4+ VIEW   COMPARISON:  Right knee radiographs dated 12/26/2021.   FINDINGS: Right knee:   No acute fracture or dislocation. There is a small to moderate-sized joint effusion. Moderate tricompartmental degenerative changes with joint space narrowing and marginal osteophytosis. Vascular calcifications are noted.   Left knee:   No acute fracture or dislocation. There is a small  joint effusion. Moderate tricompartmental degenerative changes with joint space narrowing and marginal osteophytosis. Vascular calcifications are noted.   IMPRESSION: 1. No acute osseous abnormality. 2. 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  Narrative & Impression  CLINICAL DATA:  Chronic bilateral knee pain and swelling.   EXAM: RIGHT KNEE - COMPLETE 4+ VIEW; LEFT KNEE - COMPLETE 4+ VIEW   COMPARISON:  Right knee radiographs dated 12/26/2021.   FINDINGS: Right knee:   No acute fracture or dislocation. There is a small to moderate-sized joint effusion. Moderate tricompartmental degenerative changes with joint space narrowing and marginal osteophytosis. Vascular calcifications are noted.   Left knee:   No acute fracture or dislocation. There is a small joint effusion. Moderate tricompartmental degenerative changes with joint space narrowing and marginal osteophytosis. Vascular calcifications are noted.    IMPRESSION: 1. No acute osseous abnormality. 2. 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    Narrative & Impression CLINICAL DATA:  Right hip pain.   EXAM: DG HIP (WITH OR WITHOUT PELVIS) 2-3V RIGHT   COMPARISON:  None Available.   FINDINGS: No acute fracture or dislocation. The bones are osteopenic. Cystic changes of the right femoral head and neck measure up to 3.7 cm. Orthopedic referral is advised. Mild arthritic changes of the right hip. Total left hip arthroplasty. The soft tissues are unremarkable. Vascular calcifications.   IMPRESSION: 1. No acute fracture or dislocation. 2. Cystic changes of the right femoral head and neck. Orthopedic referral is advised.  Lower Venous DVT Study   Patient Name:  Carl Pitre Sr.  Date of Exam:   06/10/2023  Medical Rec #: [id number]            Accession #:    [id number]  Date of Birth: 02-11-1961             Patient Gender: M  Patient Age:   29 years  Exam Location:  [facility]  Procedure:      VAS US  LOWER EXTREMITY VENOUS (DVT)  Referring Phys: PRENTICE COMPTON    - PATIENT SURVEYS:  PSFS: THE PATIENT SPECIFIC FUNCTIONAL SCALE  Place score of 0-10 (0 = unable to perform activity and 10 = able to perform activity at the same level as before injury or problem)  Activity Date: eval 09/12/23 11/07/23 11/28/23  Getting in and out of car (anything lower than 20 inches)  0 0   2. Balance  3 5   3. Walking without a device  2 5   4.  Social activities  0 0   Total Score 1.25 2.5     Total Score = Sum of activity scores/number of activities  Minimally Detectable Change: 3 points (for single activity); 2 points (for average score)  [facility] (nd). The Patient Specific Functional Scale . Retrieved from [email or web address]   COGNITION: Overall cognitive status: Within  functional limits for tasks assessed     SENSATION: Hx of neuropathy even before fall/surgery     LOWER EXTREMITY MMT:  MMT Right eval Left eval Right 10/24/23 Left 10/24/23 RT/Left 10/31/23 RT/L 11/05/23 RT/L 11/19/23 RT/L 11/28/23 R/L 12/17/23 R/L 12/24/23 Rt 01/02/24  Hip flexion 3- 3+ 3 3+ 3/3+ 3+/4 4-/4+ 4-/4+ 3+/4 3+/4 3+/4  Hip extension             Hip abduction             Hip  adduction             Hip internal rotation             Hip external rotation             Knee flexion 3- 4+ 4- 5 4/5 4/5 5/5  5/5 5/5   Knee extension 3- 5 3- 5 3-/5 3-/5 3/5 3p!/5 3+ *P/4- 4-/4 4/4  Ankle dorsiflexion 4 5 5 5          Ankle plantarflexion             Ankle inversion             Ankle eversion              (Blank rows = not tested)    GAIT: Distance walked: in clinic distances  Assistive device utilized: Environmental Consultant - 2 wheeled Level of assistance: Modified independence Comments: slow but steady with RW                                                                                                                                TREATMENT DATE:   01/02/24 Nustep L 6 7 min LE only Leg press ( cued to work = pressure BIL) 70# 10 x 3 sets  SL 20lb x10 each, Assist needed w/ RLE Standing march 2lb in RW 2x10 LAQ 2lb cues to for full Fom 2x10 Step up 6 in with RW 10 x CGA with assist for full TKE    12/26/23 Nustep L 6 Leg press ( cued to work = pressure BIL) 70# 10 x 3 sets  SL 20lb x10 each, Assist needed w/ RLE Standing march 2lb in RW 2x10 LAQ 2lb cues to for full Fom 2x10 4in step ups x10 each 2 rails  6in x 10  12/24/23 Nustep L 6 LE only MMT -see chart HS curls 35# 2 sets 10 SL 10 x each 15# Leg press ( cued to work = pressure BIL) 70# 10 x 3 sets LAQ 3# 3 sets 10 cued for full TKE Mini squats with RW and resisted TKE 2 sets 10 6 inch step up 10 x each with UE work on full [employer] ball core stab ex- seated and standing  12/19/23 Leg press ( cued to  work = pressure BIL)60 # 10 , 70# 10 x LAQ 3# 2x10 HS curls green 2x10 STS elevated mat 2 sets 5 with cuing to lean fwd and push thru legs and bend knees to sit vs lean back and brace with back of leg Nustep L 6 LE only 6 inch step up 10 x each with UE work on full [employer] ball core stab ex- seated and standing    12/17/23: Nustep Lvl 5 for 7 min MMTs of the BLE (see above for results) Sit>Stand from elevated surface 2 x 8 Seated Trunk Rotations with Red Medball 2 x 10 each  side Modified Sit Up edge of plinth 2 x 10 Seated Marches with green band around feet 2 x 8 each Seated Clamshells with green Tband 2 x 12  11/28/23 Nustep L 5 7 min LE Only S2S form elevated mat with UE 2x10 Leg press 50lb 2x10 LAQ 2lb 2x10 HS curls green 2x10   11/21/23 30# resited gait fwd 5 x min A Nustep L 5 LE only Leg pres 40lb 10x 50# 2 sets 10 HS curls 35# 10 x BIL. LLE 25# 10 x, RT LE 20# 10 x Knee Ext 5# 10 x LLE, unable to do RT STS 25in 2 sets 5 no UE, CG-min A with cuing for eccentric lowering LAQ RT LE 2 sets 10  107/25 NuStep L 5 x 6 min Goals Leg pres 40lb 3x10 6in step ups 2 rails x10 each 4in lateral step ups x10 each 2 rails LAQ 2x10  HS curls green x10   11/07/23 NuStep L 5 x 7 min Sit to stand 2x10 form elevated surface with UE use Leg press 40lb 2x10 6in step ups with 2 rails x10 Heel raises 2x10 LAQ 2lb 2x10  11/05/23 NuStep L5 x 6 min Goals  Sit to stands w/ UE from elevated mat 2x10 6in step ups with UE assit x10 on L, x5 on R  10/31/23 Nustep L 5 STGs assessed including MMT Leg Press 40# 2 sets 10, SL 10 x BIL 20#, AA with RT to start 6 inch step up with UE support 10 x BIL CG- min A 4 inch step down with UE 10 xBIL CG-min A STS no hands CG-min A with cuing 28 in , could not do lower 10 x  10/29/23 Nustep L 5 BIL ankle red tband 4 way 10x 2 sets  Green tband HS curl 2 sets 10 LAQ 3# 2 sets 10 Standing with RW 3# 10 x SL hip flex,ext  and abd,marching and HS curl Leg press 40# 2 sets 10. 20# SL 10 x BIL , assist to start on RT Green tband HS curl 2 sets 10   PATIENT EDUCATION:  Education details: exam findings, POC Person educated: Patient Education method: Explanation Education comprehension: verbalized understanding, returned demonstration, and needs further education  HOME EXERCISE PROGRAM: Educ on getting ankle wts, adjustable up to 5 is rec as we used 3 #- okayed doing seated and standing ex with RW  ASSESSMENT:  CLINICAL IMPRESSION:  sorry I am running late , opened tail gait on truck and fell back on hitch, hit on RT side Getting knee inj when I leave here. Slow steady progress. Slow progress today d/t fall.     Patient is a 63 y.o. M who was seen today for physical therapy evaluation and treatment for S14.109S (ICD-10-CM) - Spinal cord injury, cervical region, sequela (HCC) Pt tolerated a initial progression to therapy well. RLE is significantly weaker than R. Pt had log legs and a relatively short torso making it difficult to stand from low surfaces in conjunction with weak LE. Decrease coordination with side steps. No pain during session. Limited ROM with RLE during isolated exercises.  Will make every effort to improve function and work towards goals.   OBJECTIVE IMPAIRMENTS: Abnormal gait, decreased activity tolerance, decreased balance, decreased coordination, decreased knowledge of use of DME, decreased mobility, difficulty walking, decreased strength, postural dysfunction, and pain.   ACTIVITY LIMITATIONS: standing, squatting, stairs, transfers, bed mobility, toileting, dressing, and locomotion level  PARTICIPATION LIMITATIONS: shopping, community activity, and  yard work  PERSONAL FACTORS: Age, Behavior pattern, Education, Fitness, Past/current experiences, Profession, Social background, and Time since onset of injury/illness/exacerbation are also affecting patient's functional outcome.   REHAB  POTENTIAL: Good  CLINICAL DECISION MAKING: Evolving/moderate complexity  EVALUATION COMPLEXITY: Moderate   GOALS: Goals reviewed with patient? No  SHORT TERM GOALS: Target date: 10/24/2023   Will be compliant with appropriate progressive HEP  Baseline: Goal status: 10/08/23 MET  2.  Will be able to consistently perform functional STS tranfers from surfaces 20 inches or less  Baseline:  Goal status:10/08/23 on going  10/31/23 progressing still difficult for pt, 11/05/23 progressing still difficult for pt uses his UE, ongoing 11/19/23, Ongoing 11/28/23  Ongoing 12/24/23- still really struggles with this  01/02/24 on going   3.  Will be able to ambulate at least 366ft with quad cane no more than light MinA  Baseline:  Goal status: 10/08/23 progressing  and 10/31/23  12/24/23 progressing  and 01/02/24   4.  MMT to be at least 3+/5 in all tested groups R LE  Baseline:  Goal status: ongoing 10/24/23  and 10/31/23, Progressing 11/19/23, Progressing 11/28/23  MET 12/24/23    LONG TERM GOALS: Target date: 02/11/24   R LE MMT to be at least 4-/5 in all tested groups  Baseline:  Goal status: ongoing 10/24/23, Ongoing 11/05/23, Ongoing 11/19/23; progressing 12/17/23   01/02/24 progressing  2.  Will score at least 18 on DGI with no device to show reduced fall risk  Baseline:  Goal status: INITIAL  3.  Will be able to reciprocally ascend/descend steps without difficulty and U rail  Baseline:  Goal status: Ongoing 10/24/23, 11/28/23 and 12/24/23 needs BIL  4.  Will be able to ambulate community distances with LRAD, Mod(I) to allow for improved community access and participating in social activities  Baseline:  Goal status: Progressing 11/05/23  5.  Will be compliant with appropriate gym based exercise program by time of DC  Baseline:  Goal status: reports going to gym some with his son , using bicycle., Progressing 11/05/23 goes with his son; hasn't been recently 12/17/23  PLAN:  PT FREQUENCY:  2x/week  PT DURATION: 12 weeks  PLANNED INTERVENTIONS: 97750- Physical Performance Testing, 97110-Therapeutic exercises, 97530- Therapeutic activity, 97112- Neuromuscular re-education, 97535- Self Care, 02859- Manual therapy, and 97116- Gait training  PLAN FOR NEXT SESSION: progress func strength, gait and balance.  Angie Aubreyanna Dorrough PTA 01/02/24 8:51 AM [place]

## 2024-01-04 ENCOUNTER — Other Ambulatory Visit: Payer: Self-pay | Admitting: Physician Assistant

## 2024-01-07 ENCOUNTER — Ambulatory Visit: Admitting: Physical Therapy

## 2024-01-14 ENCOUNTER — Encounter: Payer: Self-pay | Admitting: Physical Therapy

## 2024-01-14 ENCOUNTER — Ambulatory Visit: Attending: Family Medicine | Admitting: Physical Therapy

## 2024-01-14 DIAGNOSIS — G8929 Other chronic pain: Secondary | ICD-10-CM | POA: Diagnosis present

## 2024-01-14 DIAGNOSIS — R2681 Unsteadiness on feet: Secondary | ICD-10-CM | POA: Diagnosis present

## 2024-01-14 DIAGNOSIS — M6281 Muscle weakness (generalized): Secondary | ICD-10-CM | POA: Insufficient documentation

## 2024-01-14 DIAGNOSIS — R296 Repeated falls: Secondary | ICD-10-CM | POA: Insufficient documentation

## 2024-01-14 DIAGNOSIS — M25611 Stiffness of right shoulder, not elsewhere classified: Secondary | ICD-10-CM | POA: Diagnosis present

## 2024-01-14 DIAGNOSIS — R2689 Other abnormalities of gait and mobility: Secondary | ICD-10-CM | POA: Insufficient documentation

## 2024-01-14 DIAGNOSIS — M25511 Pain in right shoulder: Secondary | ICD-10-CM | POA: Insufficient documentation

## 2024-01-14 DIAGNOSIS — M25561 Pain in right knee: Secondary | ICD-10-CM | POA: Insufficient documentation

## 2024-01-14 DIAGNOSIS — R262 Difficulty in walking, not elsewhere classified: Secondary | ICD-10-CM | POA: Diagnosis present

## 2024-01-14 NOTE — Therapy (Addendum)
 OUTPATIENT PHYSICAL THERAPY LOWER EXTREMITY TREATMENT/PROGRESS NOTE    Patient Name: Carl Guaman Sr. MRN: [id number] DOB:[birth year], 63 y.o., male Today's Date: 01/14/2024  END OF SESSION:  PT End of Session - 01/14/24 0805     Visit Number 20    Number of Visits 25    Date for Recertification  02/11/24    Authorization Type [employer] Dual    PT Start Time 0805    PT Stop Time 0844    PT Time Calculation (min) 39 min    Activity Tolerance Patient tolerated treatment well    Behavior During Therapy 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    Parathyroid  abnormality    ectopic parathyroid  gland   Presence of arteriovenous fistula for hemodialysis, primary    RUE PER PT RLE   Refusal of blood product    NO WHOLE BLOOD PROUCTS   Renal cell carcinoma (HCC)    s/p hand assisted laparoscopic bilateral nephrectomies 11/29/17, + RCC left   Secondary hyperparathyroidism    Seizures (HCC)    one episode in past, due to elevated Potassium 08/02/20- at least 4 years ago   Sleep apnea    doesn't use CPAP anymore since weight loss   Stroke [facility])    no residual   Past Surgical History:  Procedure Laterality Date   ABDOMINAL AORTOGRAM W/LOWER EXTREMITY N/A 12/09/2023   Procedure: ABDOMINAL AORTOGRAM W/LOWER EXTREMITY;  Surgeon: Sheree Penne Bruckner, MD;  Location: [facility] INVASIVE CV LAB;  Service:  Cardiovascular;  Laterality: N/A;   ANTERIOR CERVICAL CORPECTOMY N/A 05/25/2023   Procedure: ANTERIOR CERVICAL CORPECTOMY CERVICAL FOUR, CERVICAL THREE - CERVICAL FIVE FUSION;  Surgeon: [name] Shove, MD;  Location: [facility] OR;  Service: Neurosurgery;  Laterality: N/A;   AV FISTULA PLACEMENT Right    right arm   BIOPSY  08/01/2019   Procedure: BIOPSY;  Surgeon: Kristie Lamprey, MD;  Location: [facility] ENDOSCOPY;  Service: Endoscopy;;   BIOPSY  11/18/2020   Procedure: BIOPSY;  Surgeon: Stacia Glendia BRAVO, MD;  Location: [facility] ENDOSCOPY;  Service: Gastroenterology;;   BIOPSY  09/13/2021   Procedure: BIOPSY;  Surgeon: Wilhelmenia Aloha Raddle., MD;  Location: [facility] ENDOSCOPY;  Service: Gastroenterology;;   CATARACT EXTRACTION W/ INTRAOCULAR LENS  IMPLANT, BILATERAL     COLON SURGERY     COLONOSCOPY N/A 08/04/2015   Procedure: COLONOSCOPY;  Surgeon: Gordy CHRISTELLA Starch, MD;  Location: [facility] ENDOSCOPY;  Service: Endoscopy;  Laterality: N/A;   COLONOSCOPY  2017   JMP@ [facility]-good prep-mass -recall 1 yr   COLONOSCOPY N/A 09/13/2021   Procedure: COLONOSCOPY;  Surgeon: Mansouraty, Aloha Raddle., MD;  Location: [facility] ENDOSCOPY;  Service: Gastroenterology;  Laterality: N/A;   COLONOSCOPY WITH PROPOFOL  N/A 11/25/2020  Procedure: COLONOSCOPY WITH PROPOFOL ;  Surgeon: Federico Rosario BROCKS, MD;  Location: [facility] ENDOSCOPY;  Service: Gastroenterology;  Laterality: N/A;   COLONOSCOPY WITH PROPOFOL  N/A 09/23/2021   Procedure: COLONOSCOPY WITH PROPOFOL ;  Surgeon: San Sandor GAILS, DO;  Location: [facility] ENDOSCOPY;  Service: Gastroenterology;  Laterality: N/A;   ENTEROSCOPY N/A 09/23/2021   Procedure: ENTEROSCOPY;  Surgeon: San Sandor GAILS, DO;  Location: [facility] ENDOSCOPY;  Service: Gastroenterology;  Laterality: N/A;  Will place order for video capsule study as we may opt to place it during procedure   ESOPHAGOGASTRODUODENOSCOPY N/A 08/01/2019   Procedure: ESOPHAGOGASTRODUODENOSCOPY (EGD);  Surgeon: Kristie Lamprey, MD;  Location: [facility] ENDOSCOPY;  Service: Endoscopy;   Laterality: N/A;   ESOPHAGOGASTRODUODENOSCOPY N/A 11/18/2020   Procedure: ESOPHAGOGASTRODUODENOSCOPY (EGD);  Surgeon: Stacia Glendia BRAVO, MD;  Location: [facility] ENDOSCOPY;  Service: Gastroenterology;  Laterality: N/A;   ESOPHAGOGASTRODUODENOSCOPY (EGD) WITH PROPOFOL  N/A 08/04/2019   Procedure: ESOPHAGOGASTRODUODENOSCOPY (EGD) WITH PROPOFOL ;  Surgeon: Leigh Elspeth SQUIBB, MD;  Location: [facility] ENDOSCOPY;  Service: Gastroenterology;  Laterality: N/A;   ESOPHAGOGASTRODUODENOSCOPY (EGD) WITH PROPOFOL  N/A 09/13/2021   Procedure: ESOPHAGOGASTRODUODENOSCOPY (EGD) WITH PROPOFOL ;  Surgeon: Wilhelmenia Aloha Raddle., MD;  Location: [facility] ENDOSCOPY;  Service: Gastroenterology;  Laterality: N/A;   GIVENS CAPSULE STUDY N/A 09/23/2021   Procedure: GIVENS CAPSULE STUDY;  Surgeon: San Sandor GAILS, DO;  Location: [facility] ENDOSCOPY;  Service: Gastroenterology;  Laterality: N/A;   graft left arm Left    for dialysis x 2. Removed   HOT HEMOSTASIS  11/18/2020   Procedure: HOT HEMOSTASIS (ARGON PLASMA COAGULATION/BICAP);  Surgeon: Stacia Glendia BRAVO, MD;  Location: [facility] ENDOSCOPY;  Service: Gastroenterology;;   HOT HEMOSTASIS N/A 09/23/2021   Procedure: HOT HEMOSTASIS (ARGON PLASMA COAGULATION/BICAP);  Surgeon: San Sandor GAILS, DO;  Location: [facility] ENDOSCOPY;  Service: Gastroenterology;  Laterality: N/A;   INSERTION OF DIALYSIS CATHETER     Rt chest   LAPAROSCOPIC RIGHT COLECTOMY N/A 08/05/2015   Procedure: LAPAROSCOPIC RIGHT COLECTOMY- ASCENDING;  Surgeon: Jina Nephew, MD;  Location: [facility] OR;  Service: General;  Laterality: N/A;   LOWER EXTREMITY ANGIOGRAPHY N/A 12/09/2023   Procedure: Lower Extremity Angiography;  Surgeon: Sheree Penne Bruckner, MD;  Location: [facility] INVASIVE CV LAB;  Service: Cardiovascular;  Laterality: N/A;   LOWER EXTREMITY INTERVENTION N/A 12/09/2023   Procedure: LOWER EXTREMITY INTERVENTION;  Surgeon: Sheree Penne Bruckner, MD;  Location: [facility] INVASIVE CV LAB;  Service: Cardiovascular;  Laterality: N/A;   MASS  EXCISION Left 05/28/2019   Procedure: EXCISION SOFT TISSUE MASS LEFT SHOULDER;  Surgeon: Eletha Boas, MD;  Location: [facility] ORS;  Service: General;  Laterality: Left;   NEPHRECTOMY Bilateral    PARATHYROIDECTOMY N/A 06/12/2016   Procedure: TOTAL PARATHYROIDECTOMY WITH AUTOTRANSPLANTATION TO LEFT FOREARM;  Surgeon: Boas Eletha, MD;  Location: [facility] OR;  Service: General;  Laterality: N/A;   PARATHYROIDECTOMY N/A 10/23/2016   Procedure: PARATHYROIDECTOMY;  Surgeon: Eletha Boas, MD;  Location: [facility] OR;  Service: General;  Laterality: N/A;   REVERSE SHOULDER ARTHROPLASTY Right 08/24/2020   Procedure: REVERSE SHOULDER ARTHROPLASTY;  Surgeon: Cristy Bonner DASEN, MD;  Location: [facility] OR;  Service: Orthopedics;  Laterality: Right;   REVISON OF ARTERIOVENOUS FISTULA Right 07/16/2017   Procedure: REVISION OF ARTERIOVENOUS FISTULA  Right ARM;  Surgeon: Sheree Penne Bruckner, MD;  Location: [facility] OR;  Service: Vascular;  Laterality: Right;   TOTAL HIP ARTHROPLASTY Left 11/14/2020   Procedure: LEFT TOTAL HIP ARTHROPLASTY ANTERIOR APPROACH;  Surgeon: [name] Kay HERO, MD;  Location: [facility] OR;  Service: Orthopedics;  Laterality: Left;   UPPER GASTROINTESTINAL ENDOSCOPY  2021   @  [facility]   Patient Active Problem List   Diagnosis Date Noted   Right knee buckling 11/15/2023   Primary osteoarthritis of right knee 11/15/2023   Abnormality of gait 11/15/2023   Primary osteoarthritis of left knee 10/03/2023   Right hip pain 07/14/2023   Coping style affecting medical condition 06/05/2023   Spinal cord injury, cervical region, sequela 05/30/2023   Cervical vertebral fracture (HCC) 05/25/2023   History of GI bleed 05/25/2023   C4 cervical fracture (HCC) 05/25/2023   Duodenitis    Gastritis and gastroduodenitis    Ischemic colitis    Bright red blood per rectum    ABLA (acute blood loss anemia)    Gastric ulcer    Duodenal ulcer    Diverticulosis of colon without hemorrhage    GI bleed 09/17/2021   Acute upper GI bleeding  09/11/2021   Mucosal abnormality of esophagus    Mucosal abnormality of stomach    Mucosal abnormality of duodenum    Upper GI bleed    Left hip pain 11/16/2020   Type 2 diabetes mellitus with other specified complication (HCC) 11/14/2020   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/14/2020   Primary osteoarthritis of left hip 10/07/2020   Status post reverse total arthroplasty of right shoulder 08/24/2020   Diverticulitis 03/08/2020   ESRD (end stage renal disease) (HCC) 03/08/2020   Polyneuropathy in diseases classified elsewhere 03/08/2020   Pure hypercholesterolemia 03/08/2020   Sacral back pain 03/08/2020   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 11/10/2019   Acute gastric ulcer with hemorrhage    Acute GI bleeding 07/31/2019   Hypotension of hemodialysis 07/31/2019   Diverticulitis large intestine 07/31/2019   Mass of soft tissue of shoulder 05/26/2019   Awaiting organ transplant status 03/18/2019   Finding of cocaine in blood 02/25/2019   Anaphylactic shock, unspecified, sequela 11/21/2018   Hypercalcemia 10/28/2018   Diarrhea, unspecified 08/08/2018   Hyperkalemia 03/12/2018   Renal cell carcinoma of left kidney (HCC) 12/24/2017   Fluid overload, unspecified 07/06/2017   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   Other specified disorders of kidney and ureter 11/22/2016   Secondary hyperparathyroidism of renal origin 10/23/2016   Headache disorder 08/21/2016   TIA (transient ischemic attack)    Essential hypertension    Seizure (HCC)    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    Lung nodule    Diverticulitis of cecum 07/21/2015   Diverticulitis of large intestine without perforation or abscess without bleeding    Right lower quadrant pain    Obstructive sleep apnea 02/08/2015   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   Diverticulosis 07/13/2014   Lower GI bleed 07/12/2014   Refusal of blood transfusions as patient is Jehovah's Witness     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   HLD (hyperlipidemia)    PRES (posterior reversible encephalopathy syndrome)    History of CVA (cerebrovascular accident) 05/23/2014   Anemia of chronic disease 05/23/2014   GERD (gastroesophageal reflux disease) 12/09/2012   Gastrointestinal bleeding 11/12/2012   Melena 11/12/2012   Other psychoactive substance dependence, uncomplicated (HCC) 10/08/2012   Iron deficiency anemia, unspecified 06/10/2012   Moderate protein-calorie malnutrition 07/29/2009   Coagulation defect, unspecified 08/29/2006   Type 2 diabetes mellitus with diabetic peripheral angiopathy without gangrene (HCC) 08/29/2006   Hypertensive chronic kidney disease with stage 5 chronic kidney disease or end stage renal disease (HCC) 08/02/2006    PCP: [name] MD   REFERRING  PROVIDER: Debby Fidela CROME, NP  REFERRING DIAG:  Diagnosis  S14.109S (ICD-10-CM) - Spinal cord injury, cervical region, sequela (HCC)    THERAPY DIAG:  Difficulty in walking, not elsewhere classified  Muscle weakness (generalized)  Unsteadiness on feet  Repeated falls  Stiffness of right shoulder, not elsewhere classified  Chronic right shoulder pain  Chronic pain of right knee  Other abnormalities of gait and mobility  Rationale for Evaluation and Treatment: Rehabilitation  ONSET DATE:  ACDF 05/25/23  SUBJECTIVE:   SUBJECTIVE STATEMENT:   Pt states that he has upcoming injection in series of 3 (2/3), and reports inc stiffness in the knee today, crunches. Pt states that he has been working towards progressing leg strength this reporting period. Would like to integrate upper quarter strength. Pt states that he continues to struggle getting out of his convertible. Pt reports feeling 40-50% improved from initial evaluation.    PERTINENT HISTORY: See above   ACDF C3-C5 fusion, anterior cervical corpectomy C3-C4  PAIN:  Are you having  pain? 5/10 R knee  PRECAUTIONS:  Precautions:  Precautions Precautions: Fall, Cervical, Other (comment) Recall of Precautions/Restrictions: Impaired Precaution/Restrictions Comments: Verbally reviewed cervical precautions. Required Braces or Orthoses: Cervical Brace Cervical Brace: Soft collar, At all times Restrictions Weight Bearing Restrictions Per Provider Order: No General: Pain: No c/o pain.   RED FLAGS: None   WEIGHT BEARING RESTRICTIONS: No  FALLS:  Has patient fallen in last 6 months? Yes. Number of falls 3- one that led to surgery, then 2 after getting home from the hospital  LIVING ENVIRONMENT: Lives with: lives with their son Lives in: House/apartment Stairs: ground floor apt  Has following equipment at home: Single point cane, Environmental Consultant - 2 wheeled, and Environmental Consultant - 4 wheeled  OCCUPATION: retired- used to pressure wash   PLOF: Independent, Independent with basic ADLs, Independent with gait, and Independent with transfers  PATIENT GOALS: get back to normal ability, drive convertible again   NEXT MD VISIT: Dr. Cornelio 11/15/23  OBJECTIVE:  Note: Objective measures were completed at Evaluation unless otherwise noted.  DIAGNOSTIC FINDINGS:    CLINICAL DATA:  63 year old male with altered mental status.   EXAM: CT HEAD WITHOUT CONTRAST   TECHNIQUE: Contiguous axial images were obtained from the base of the skull through the vertex without intravenous contrast.   RADIATION DOSE REDUCTION: This exam was performed according to the departmental dose-optimization program which includes automated exposure control, adjustment of the mA and/or kV according to patient size and/or use of iterative reconstruction technique.   COMPARISON:  Brain MRI and head CT 05/25/2023.   FINDINGS: Brain:   Unusual extensive, diffuse pachymeningeal calcification which has been present since at least 2013. Stable cerebral volume. No midline shift, ventriculomegaly, mass effect,  evidence of mass lesion, intracranial hemorrhage or evidence of cortically based acute infarction. Small area of chronic encephalomalacia in the medial left occipital lobe redemonstrated. Stable gray-white matter differentiation throughout the brain.   Vascular: Chronic severe pachymeningeal calcification.   Skull: Appears stable.  No acute osseous abnormality identified.   Sinuses/Orbits: Continued left middle ear and mastoid opacification. Other Visualized paranasal sinuses and mastoids are stable and well aerated.   Other: Anterior left scalp soft tissue injury has regressed since earlier in the month. Orbits soft tissues appears stable.   IMPRESSION: 1. No acute intracranial abnormality. 2. 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 3. Ongoing left middle ear and mastoid opacification. No acute osseous abnormality identified. 4. Regressed scalp  soft tissue injury since 05/25/2023.  CLINICAL DATA:  Chronic bilateral knee pain and swelling.   EXAM: RIGHT KNEE - COMPLETE 4+ VIEW; LEFT KNEE - COMPLETE 4+ VIEW   COMPARISON:  Right knee radiographs dated 12/26/2021.   FINDINGS: Right knee:   No acute fracture or dislocation. There is a small to moderate-sized joint effusion. Moderate tricompartmental degenerative changes with joint space narrowing and marginal osteophytosis. Vascular calcifications are noted.   Left knee:   No acute fracture or dislocation. There is a small joint effusion. Moderate tricompartmental degenerative changes with joint space narrowing and marginal osteophytosis. Vascular calcifications are noted.   IMPRESSION: 1. No acute osseous abnormality. 2. 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  Narrative & Impression  CLINICAL DATA:  Chronic bilateral knee pain and swelling.   EXAM: RIGHT KNEE -  COMPLETE 4+ VIEW; LEFT KNEE - COMPLETE 4+ VIEW   COMPARISON:  Right knee radiographs dated 12/26/2021.   FINDINGS: Right knee:   No acute fracture or dislocation. There is a small to moderate-sized joint effusion. Moderate tricompartmental degenerative changes with joint space narrowing and marginal osteophytosis. Vascular calcifications are noted.   Left knee:   No acute fracture or dislocation. There is a small joint effusion. Moderate tricompartmental degenerative changes with joint space narrowing and marginal osteophytosis. Vascular calcifications are noted.   IMPRESSION: 1. No acute osseous abnormality. 2. 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    Narrative & Impression CLINICAL DATA:  Right hip pain.   EXAM: DG HIP (WITH OR WITHOUT PELVIS) 2-3V RIGHT   COMPARISON:  None Available.   FINDINGS: No acute fracture or dislocation. The bones are osteopenic. Cystic changes of the right femoral head and neck measure up to 3.7 cm. Orthopedic referral is advised. Mild arthritic changes of the right hip. Total left hip arthroplasty. The soft tissues are unremarkable. Vascular calcifications.   IMPRESSION: 1. No acute fracture or dislocation. 2. Cystic changes of the right femoral head and neck. Orthopedic referral is advised.  Lower Venous DVT Study   Patient Name:  Carl Callaham Sr.  Date of Exam:   06/10/2023  Medical Rec #: [id number]            Accession #:    [id number]  Date of Birth: Feb 02, 1961             Patient Gender: M  Patient Age:   46 years  Exam Location:  [facility]  Procedure:      VAS US  LOWER EXTREMITY VENOUS (DVT)  Referring Phys: PRENTICE COMPTON    - PATIENT SURVEYS:  PSFS: THE PATIENT SPECIFIC FUNCTIONAL SCALE  Place score of 0-10 (0 = unable to perform activity and 10 = able to perform activity at the same level as before injury or  problem)  Activity Date: eval 09/12/23 11/07/23 11/28/23 01/14/24  Getting in and out of car (anything lower than 20 inches)  0 0  7  2. Balance  3 5  7   3. Walking without a device  2 5  0  4.  Social activities  0 0  5  Total Score 1.25 2.5  4.75    Total Score = Sum of activity scores/number of activities  Minimally Detectable Change: 3 points (for single activity); 2 points (for average score)  [facility] (nd). The Patient Specific Functional Scale . Retrieved from [email or web address]   DGI  Moderate impairment: walks 20', slow speed, abnormal gait patterns, evidence for imbalance. (1) Moderate impairment: Makes only minor adjustments to walking speed, or accomplishes a change in speed with significant gait deviations, or changes speed but loses balance but is able to recover and continue walking. (1) Mild impairment: Performs head turns smoothly with slight change in gait velocity, i.e. minor disruption to smooth gait path or uses walking aid. (2) Mild impairment: Performs head turns smoothly with slight change in gait velocity, i.e. minor disruption to smooth gait path or uses walking aid. (2) Moderate impairment: Turns slowly, requires verbal cueing, requires several small steps to catch balance following turn and stop. (1) Severe impairment: Cannot perform without assistance. (0) Moderate impairment: Is able to clear cones but must significantly slow speed to accomplish task, or requires verbal cueing. (1) Moderate impairment: Two feet to a stair, must use rail. (1) Total Score: 9/24 Scores below 19 suggest that the subject assessed has a higher risk of prospective falls   COGNITION: Overall cognitive status: Within functional limits for tasks assessed     SENSATION: Hx of neuropathy even before fall/surgery     LOWER EXTREMITY MMT:  MMT Right eval Left eval Right 10/24/23 Left 10/24/23 RT/Left 10/31/23 RT/L  11/05/23 RT/L 11/19/23 RT/L 11/28/23 R/L 12/17/23 R/L 12/24/23 Rt 01/02/24 R/L 01/14/24  Hip flexion 3- 3+ 3 3+ 3/3+ 3+/4 4-/4+ 4-/4+ 3+/4 3+/4 3+/4 3+, 4-  Hip extension              Hip abduction            3, 3  Hip adduction              Hip internal rotation              Hip external rotation              Knee flexion 3- 4+ 4- 5 4/5 4/5 5/5  5/5 5/5  4+, 5  Knee extension 3- 5 3- 5 3-/5 3-/5 3/5 3p!/5 3+ *P/4- 4-/4 4/4 4+, 5  Ankle dorsiflexion 4 5 5 5           Ankle plantarflexion              Ankle inversion              Ankle eversion               (Blank rows = not tested)  UPPER EXTREMITY MMT: R/L  Flexion: 3+/5, 3+/5 Extension: 4/5, 4+/5 Add: 4+/5, 4+/5 Abd: 3+/5, 4-/5   GAIT: Distance walked: in clinic distances  Assistive device utilized: Environmental Consultant - 2 wheeled Level of assistance: Modified independence Comments: slow but steady with RW                                                                                                                                TREATMENT DATE:  01/14/24 PT POC reviewed,  goals assessed and updated to reflect current status  01/02/24 Nustep L 6 7 min LE only Leg press ( cued to work = pressure BIL) 70# 10 x 3 sets  SL 20lb x10 each, Assist needed w/ RLE Standing march 2lb in RW 2x10 LAQ 2lb cues to for full Fom 2x10 Step up 6 in with RW 10 x CGA with assist for full TKE    12/26/23 Nustep L 6 Leg press ( cued to work = pressure BIL) 70# 10 x 3 sets  SL 20lb x10 each, Assist needed w/ RLE Standing march 2lb in RW 2x10 LAQ 2lb cues to for full Fom 2x10 4in step ups x10 each 2 rails  6in x 10  12/24/23 Nustep L 6 LE only MMT -see chart HS curls 35# 2 sets 10 SL 10 x each 15# Leg press ( cued to work = pressure BIL) 70# 10 x 3 sets LAQ 3# 3 sets 10 cued for full TKE Mini squats with RW and resisted TKE 2 sets 10 6 inch step up 10 x each with UE work on full [employer] ball core stab ex- seated and  standing  12/19/23 Leg press ( cued to work = pressure BIL)60 # 10 , 70# 10 x LAQ 3# 2x10 HS curls green 2x10 STS elevated mat 2 sets 5 with cuing to lean fwd and push thru legs and bend knees to sit vs lean back and brace with back of leg Nustep L 6 LE only 6 inch step up 10 x each with UE work on full [employer] ball core stab ex- seated and standing    12/17/23: Nustep Lvl 5 for 7 min MMTs of the BLE (see above for results) Sit>Stand from elevated surface 2 x 8 Seated Trunk Rotations with Red Medball 2 x 10 each side Modified Sit Up edge of plinth 2 x 10 Seated Marches with green band around feet 2 x 8 each Seated Clamshells with green Tband 2 x 12  11/28/23 Nustep L 5 7 min LE Only S2S form elevated mat with UE 2x10 Leg press 50lb 2x10 LAQ 2lb 2x10 HS curls green 2x10      PATIENT EDUCATION:  Education details: HEP, PT POC, current functional status, activity modifications Person educated: Patient Education method: Explanation Education comprehension: verbalized understanding, returned demonstration, and needs further education  HOME EXERCISE PROGRAM: Educ on getting ankle wts, adjustable up to 5 is rec as we used 3 #- okayed doing seated and standing ex with RW  ASSESSMENT:  CLINICAL IMPRESSION:  Pt has been responding well to his current regimen, demonstrates improving strength, dec activity tolerance with complications of inc knee pain and de UE strength which limits transfers and requires pt to have assistance when managing transfers to and from lower surfaces. Pt able to tolerate short bouts of amb with SPC but is limited by R knee instability and returns to walker for support and safety. Pt able to demonstrate stair negotiation with BUE HR using both non reciprocal and reciprocal pattern. Continue to progress strength, stability, coordination and motor control, gait and balance through functional movement patterns. Pt stands to benefit from continued skilled  physical therapy to address deficit areas and restore safety with activities and participations at home and in the community.       Patient is a 63 y.o. M who was seen today for physical therapy evaluation and treatment for S14.109S (ICD-10-CM) - Spinal cord injury, cervical region, sequela (HCC) Pt  tolerated a initial progression to therapy well. RLE is significantly weaker than R. Pt had log legs and a relatively short torso making it difficult to stand from low surfaces in conjunction with weak LE. Decrease coordination with side steps. No pain during session. Limited ROM with RLE during isolated exercises.  Will make every effort to improve function and work towards goals.   OBJECTIVE IMPAIRMENTS: Abnormal gait, decreased activity tolerance, decreased balance, decreased coordination, decreased knowledge of use of DME, decreased mobility, difficulty walking, decreased strength, postural dysfunction, and pain.   ACTIVITY LIMITATIONS: standing, squatting, stairs, transfers, bed mobility, toileting, dressing, and locomotion level  PARTICIPATION LIMITATIONS: shopping, community activity, and yard work  PERSONAL FACTORS: Age, Behavior pattern, Education, Fitness, Past/current experiences, Profession, Social background, and Time since onset of injury/illness/exacerbation are also affecting patient's functional outcome.   REHAB POTENTIAL: Good  CLINICAL DECISION MAKING: Evolving/moderate complexity  EVALUATION COMPLEXITY: Moderate   GOALS: Goals reviewed with patient? No  SHORT TERM GOALS: Target date: 10/24/2023   Will be compliant with appropriate progressive HEP  Baseline: Goal status: 10/08/23 MET  2.  Will be able to consistently perform functional STS tranfers from surfaces 20 inches or less  Baseline:  Goal status:10/08/23 on going  10/31/23 progressing still difficult for pt, 11/05/23 progressing still difficult for pt uses his UE, ongoing 11/19/23, Ongoing 11/28/23  Ongoing 12/24/23-  still really struggles with this  01/02/24 on going, 01/14/24 inc difficulty without elevated surface 30+in   3.  Will be able to ambulate at least 316ft with quad cane no more than light MinA  Baseline:  Goal status: 10/08/23 progressing  and 10/31/23  12/24/23 progressing  and 01/02/24, 01/14/24 progressing distance and assist RLE instability limits distance for safety, 269ft SBA  4.  MMT to be at least 3+/5 in all tested groups R LE  Baseline:  Goal status: ongoing 10/24/23  and 10/31/23, Progressing 11/19/23, Progressing 11/28/23  MET 12/24/23    LONG TERM GOALS: Target date: 02/11/24   R LE MMT to be at least 4-/5 in all tested groups  Baseline:  Goal status: ongoing 10/24/23, Ongoing 11/05/23, Ongoing 11/19/23; progressing 12/17/23   01/02/24 progressing, 01/14/24-progressing  2.  Will score at least 18 on DGI with no device to show reduced fall risk  01/14/24: 9/24 Goal status: INITIAL  3.  Will be able to reciprocally ascend/descend steps without difficulty and U rail  Baseline:  Goal status: Ongoing 10/24/23, 11/28/23 and 12/24/23 needs BIL, 01/14/24 inc reliance on BUE HR   4.  Will be able to ambulate community distances with LRAD, Mod(I) to allow for improved community access and participating in social activities  Baseline:  Goal status: Progressing 11/05/23, progressing 01/14/24 with RW  5.  Will be compliant with appropriate gym based exercise program by time of DC  Baseline:  Goal status: reports going to gym some with his son , using bicycle., Progressing 11/05/23 goes with his son; hasn't been recently 12/17/23, 12/2/5, continue to progress towards increased options for workout routine at gym   PLAN:  PT FREQUENCY: 2x/week  PT DURATION: 12 weeks  PLANNED INTERVENTIONS: 97750- Physical Performance Testing, 97110-Therapeutic exercises, 97530- Therapeutic activity, 97112- Neuromuscular re-education, 97535- Self Care, [id number]- Manual therapy, and 97116- Gait training  PLAN FOR  NEXT SESSION: progress func strength, gait and balance.  Stann Ohara, PT DPT CLT CES 01/14/24 12:07 PM [place]

## 2024-01-16 ENCOUNTER — Ambulatory Visit: Admitting: Physical Therapy

## 2024-01-16 ENCOUNTER — Encounter: Payer: Self-pay | Admitting: Physical Therapy

## 2024-01-16 DIAGNOSIS — R2681 Unsteadiness on feet: Secondary | ICD-10-CM

## 2024-01-16 DIAGNOSIS — M6281 Muscle weakness (generalized): Secondary | ICD-10-CM

## 2024-01-16 DIAGNOSIS — R262 Difficulty in walking, not elsewhere classified: Secondary | ICD-10-CM | POA: Diagnosis not present

## 2024-01-16 DIAGNOSIS — R296 Repeated falls: Secondary | ICD-10-CM

## 2024-01-16 NOTE — Therapy (Signed)
 OUTPATIENT PHYSICAL THERAPY LOWER EXTREMITY TREATMENT   Patient Name: Carl Auletta Sr. MRN: [id number] DOB:1960/04/29, 63 y.o., male Today's Date: 01/16/2024  END OF SESSION:  PT End of Session - 01/16/24 0818     Visit Number 21    Date for Recertification  02/11/24    PT Start Time 0815    PT Stop Time 0845    PT Time Calculation (min) 30 min    Activity Tolerance Patient tolerated treatment well    Behavior During Therapy [facility] for tasks assessed/performed            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    Parathyroid  abnormality    ectopic parathyroid  gland   Presence of arteriovenous fistula for hemodialysis, primary    RUE PER PT RLE   Refusal of blood product    NO WHOLE BLOOD PROUCTS   Renal cell carcinoma (HCC)    s/p hand assisted laparoscopic bilateral nephrectomies 11/29/17, + RCC left   Secondary hyperparathyroidism    Seizures (HCC)    one episode in past, due to elevated Potassium 08/02/20- at least 4 years ago   Sleep apnea    doesn't use CPAP anymore since weight loss   Stroke [facility])    no residual   Past Surgical History:  Procedure Laterality Date   ABDOMINAL AORTOGRAM W/LOWER EXTREMITY N/A 12/09/2023   Procedure: ABDOMINAL AORTOGRAM W/LOWER EXTREMITY;  Surgeon: Sheree Penne Bruckner, MD;  Location: [facility] INVASIVE CV LAB;  Service: Cardiovascular;  Laterality: N/A;   ANTERIOR CERVICAL CORPECTOMY N/A 05/25/2023    Procedure: ANTERIOR CERVICAL CORPECTOMY CERVICAL FOUR, CERVICAL THREE - CERVICAL FIVE FUSION;  Surgeon: [name] Shove, MD;  Location: [facility] OR;  Service: Neurosurgery;  Laterality: N/A;   AV FISTULA PLACEMENT Right    right arm   BIOPSY  08/01/2019   Procedure: BIOPSY;  Surgeon: Kristie Lamprey, MD;  Location: [facility] ENDOSCOPY;  Service: Endoscopy;;   BIOPSY  11/18/2020   Procedure: BIOPSY;  Surgeon: Stacia Glendia BRAVO, MD;  Location: [facility] ENDOSCOPY;  Service: Gastroenterology;;   BIOPSY  09/13/2021   Procedure: BIOPSY;  Surgeon: Wilhelmenia Aloha Raddle., MD;  Location: [facility] ENDOSCOPY;  Service: Gastroenterology;;   CATARACT EXTRACTION W/ INTRAOCULAR LENS  IMPLANT, BILATERAL     COLON SURGERY     COLONOSCOPY N/A 08/04/2015   Procedure: COLONOSCOPY;  Surgeon: Gordy CHRISTELLA Starch, MD;  Location: [facility] ENDOSCOPY;  Service: Endoscopy;  Laterality: N/A;   COLONOSCOPY  2017   JMP@ [facility]-good prep-mass -recall 1 yr   COLONOSCOPY N/A 09/13/2021   Procedure: COLONOSCOPY;  Surgeon: Mansouraty, Aloha Raddle., MD;  Location: [facility] ENDOSCOPY;  Service: Gastroenterology;  Laterality: N/A;   COLONOSCOPY WITH PROPOFOL  N/A 11/25/2020   Procedure: COLONOSCOPY WITH PROPOFOL ;  Surgeon: Federico Rosario BROCKS, MD;  Location: [facility] ENDOSCOPY;  Service: Gastroenterology;  Laterality: N/A;   COLONOSCOPY WITH PROPOFOL  N/A 09/23/2021   Procedure: COLONOSCOPY WITH PROPOFOL ;  Surgeon: San Sandor GAILS, DO;  Location: [facility] ENDOSCOPY;  Service: Gastroenterology;  Laterality: N/A;   ENTEROSCOPY N/A 09/23/2021   Procedure: ENTEROSCOPY;  Surgeon: San Sandor GAILS, DO;  Location: [facility] ENDOSCOPY;  Service: Gastroenterology;  Laterality: N/A;  Will place order for video capsule study as we may opt to place it during procedure   ESOPHAGOGASTRODUODENOSCOPY N/A 08/01/2019   Procedure: ESOPHAGOGASTRODUODENOSCOPY (EGD);  Surgeon: Kristie Lamprey, MD;  Location: [facility] ENDOSCOPY;  Service: Endoscopy;  Laterality: N/A;   ESOPHAGOGASTRODUODENOSCOPY N/A 11/18/2020   Procedure:  ESOPHAGOGASTRODUODENOSCOPY (EGD);  Surgeon: Stacia Glendia BRAVO, MD;  Location: [facility] ENDOSCOPY;  Service: Gastroenterology;  Laterality: N/A;   ESOPHAGOGASTRODUODENOSCOPY (EGD) WITH PROPOFOL  N/A 08/04/2019   Procedure: ESOPHAGOGASTRODUODENOSCOPY (EGD) WITH PROPOFOL ;  Surgeon: Leigh Elspeth SQUIBB, MD;  Location: [facility] ENDOSCOPY;  Service: Gastroenterology;  Laterality: N/A;   ESOPHAGOGASTRODUODENOSCOPY (EGD) WITH PROPOFOL  N/A 09/13/2021   Procedure: ESOPHAGOGASTRODUODENOSCOPY (EGD) WITH PROPOFOL ;  Surgeon: Wilhelmenia Aloha Raddle., MD;  Location: [facility] ENDOSCOPY;  Service: Gastroenterology;  Laterality: N/A;   GIVENS CAPSULE STUDY N/A 09/23/2021   Procedure: GIVENS CAPSULE STUDY;  Surgeon: San Sandor GAILS, DO;  Location: [facility] ENDOSCOPY;  Service: Gastroenterology;  Laterality: N/A;   graft left arm Left    for dialysis x 2. Removed   HOT HEMOSTASIS  11/18/2020   Procedure: HOT HEMOSTASIS (ARGON PLASMA COAGULATION/BICAP);  Surgeon: Stacia Glendia BRAVO, MD;  Location: [facility] ENDOSCOPY;  Service: Gastroenterology;;   HOT HEMOSTASIS N/A 09/23/2021   Procedure: HOT HEMOSTASIS (ARGON PLASMA COAGULATION/BICAP);  Surgeon: San Sandor GAILS, DO;  Location: [facility] ENDOSCOPY;  Service: Gastroenterology;  Laterality: N/A;   INSERTION OF DIALYSIS CATHETER     Rt chest   LAPAROSCOPIC RIGHT COLECTOMY N/A 08/05/2015   Procedure: LAPAROSCOPIC RIGHT COLECTOMY- ASCENDING;  Surgeon: Jina Nephew, MD;  Location: [facility] OR;  Service: General;  Laterality: N/A;   LOWER EXTREMITY ANGIOGRAPHY N/A 12/09/2023   Procedure: Lower Extremity Angiography;  Surgeon: Sheree Penne Bruckner, MD;  Location: [facility] INVASIVE CV LAB;  Service: Cardiovascular;  Laterality: N/A;   LOWER EXTREMITY INTERVENTION N/A 12/09/2023   Procedure: LOWER EXTREMITY INTERVENTION;  Surgeon: Sheree Penne Bruckner, MD;  Location: [facility] INVASIVE CV LAB;  Service: Cardiovascular;  Laterality: N/A;   MASS EXCISION Left 05/28/2019   Procedure: EXCISION SOFT TISSUE MASS LEFT  SHOULDER;  Surgeon: Eletha Boas, MD;  Location: [facility] ORS;  Service: General;  Laterality: Left;   NEPHRECTOMY Bilateral    PARATHYROIDECTOMY N/A 06/12/2016   Procedure: TOTAL PARATHYROIDECTOMY WITH AUTOTRANSPLANTATION TO LEFT FOREARM;  Surgeon: Boas Eletha, MD;  Location: [facility] OR;  Service: General;  Laterality: N/A;   PARATHYROIDECTOMY N/A 10/23/2016   Procedure: PARATHYROIDECTOMY;  Surgeon: Eletha Boas, MD;  Location: [facility] OR;  Service: General;  Laterality: N/A;   REVERSE SHOULDER ARTHROPLASTY Right 08/24/2020   Procedure: REVERSE SHOULDER ARTHROPLASTY;  Surgeon: Cristy Bonner DASEN, MD;  Location: [facility] OR;  Service: Orthopedics;  Laterality: Right;   REVISON OF ARTERIOVENOUS FISTULA Right 07/16/2017   Procedure: REVISION OF ARTERIOVENOUS FISTULA  Right ARM;  Surgeon: Sheree Penne Bruckner, MD;  Location: [facility] OR;  Service: Vascular;  Laterality: Right;   TOTAL HIP ARTHROPLASTY Left 11/14/2020   Procedure: LEFT TOTAL HIP ARTHROPLASTY ANTERIOR APPROACH;  Surgeon: [name] Kay HERO, MD;  Location: [facility] OR;  Service: Orthopedics;  Laterality: Left;   UPPER GASTROINTESTINAL ENDOSCOPY  2021   @ [facility]   Patient Active Problem List   Diagnosis Date Noted  Right knee buckling 11/15/2023   Primary osteoarthritis of right knee 11/15/2023   Abnormality of gait 11/15/2023   Primary osteoarthritis of left knee 10/03/2023   Right hip pain 07/14/2023   Coping style affecting medical condition 06/05/2023   Spinal cord injury, cervical region, sequela 05/30/2023   Cervical vertebral fracture (HCC) 05/25/2023   History of GI bleed 05/25/2023   C4 cervical fracture (HCC) 05/25/2023   Duodenitis    Gastritis and gastroduodenitis    Ischemic colitis    Bright red blood per rectum    ABLA (acute blood loss anemia)    Gastric ulcer    Duodenal ulcer    Diverticulosis of colon without hemorrhage    GI bleed 09/17/2021   Acute upper GI bleeding 09/11/2021   Mucosal abnormality of esophagus    Mucosal abnormality of  stomach    Mucosal abnormality of duodenum    Upper GI bleed    Left hip pain 11/16/2020   Type 2 diabetes mellitus with other specified complication (HCC) 11/14/2020   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/14/2020   Primary osteoarthritis of left hip 10/07/2020   Status post reverse total arthroplasty of right shoulder 08/24/2020   Diverticulitis 03/08/2020   ESRD (end stage renal disease) (HCC) 03/08/2020   Polyneuropathy in diseases classified elsewhere 03/08/2020   Pure hypercholesterolemia 03/08/2020   Sacral back pain 03/08/2020   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 11/10/2019   Acute gastric ulcer with hemorrhage    Acute GI bleeding 07/31/2019   Hypotension of hemodialysis 07/31/2019   Diverticulitis large intestine 07/31/2019   Mass of soft tissue of shoulder 05/26/2019   Awaiting organ transplant status 03/18/2019   Finding of cocaine in blood 02/25/2019   Anaphylactic shock, unspecified, sequela 11/21/2018   Hypercalcemia 10/28/2018   Diarrhea, unspecified 08/08/2018   Hyperkalemia 03/12/2018   Renal cell carcinoma of left kidney (HCC) 12/24/2017   Fluid overload, unspecified 07/06/2017   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   Other specified disorders of kidney and ureter 11/22/2016   Secondary hyperparathyroidism of renal origin 10/23/2016   Headache disorder 08/21/2016   TIA (transient ischemic attack)    Essential hypertension    Seizure (HCC)    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    Lung nodule    Diverticulitis of cecum 07/21/2015   Diverticulitis of large intestine without perforation or abscess without bleeding    Right lower quadrant pain    Obstructive sleep apnea 02/08/2015   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   Diverticulosis 07/13/2014   Lower GI bleed 07/12/2014   Refusal of blood transfusions as patient is Jehovah's Witness    Parotid mass    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 06/13/2014   HLD (hyperlipidemia)    PRES (posterior reversible encephalopathy syndrome)    History of CVA (cerebrovascular accident) 05/23/2014   Anemia of chronic disease 05/23/2014   GERD (gastroesophageal reflux disease) 12/09/2012   Gastrointestinal bleeding 11/12/2012   Melena 11/12/2012   Other psychoactive substance dependence, uncomplicated (HCC) 10/08/2012   Iron deficiency anemia, unspecified 06/10/2012   Moderate protein-calorie malnutrition 07/29/2009   Coagulation defect, unspecified 08/29/2006   Type 2 diabetes mellitus with diabetic peripheral angiopathy without gangrene (HCC) 08/29/2006   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     PERTINENT HISTORY: See above   ACDF C3-C5 fusion, anterior cervical corpectomy C3-C4  PAIN:  Are you having pain? 5/10 R knee  PRECAUTIONS:  Precautions:  Precautions Precautions: Fall, Cervical, Other (comment) Recall of Precautions/Restrictions: Impaired Precaution/Restrictions Comments: Verbally reviewed cervical precautions. Required Braces or Orthoses: Cervical Brace Cervical Brace: Soft collar, At all times Restrictions Weight Bearing Restrictions Per Provider Order: No General: Pain: No c/o pain.   RED FLAGS: None   WEIGHT BEARING RESTRICTIONS: No  FALLS:  Has patient fallen in last 6 months? Yes. Number of falls 3- one that led to surgery, then 2 after getting  home from the hospital  LIVING ENVIRONMENT: Lives with: lives with their son Lives in: House/apartment Stairs: ground floor apt  Has following equipment at home: Single point cane, Environmental Consultant - 2 wheeled, and Environmental Consultant - 4 wheeled  OCCUPATION: retired- used to pressure wash   PLOF: Independent, Independent with basic ADLs, Independent with gait, and Independent with transfers  PATIENT GOALS: get back to normal ability, drive convertible again   NEXT MD VISIT: Dr. Cornelio 11/15/23  OBJECTIVE:  Note: Objective measures were completed at Evaluation unless otherwise noted.  DIAGNOSTIC FINDINGS:    CLINICAL DATA:  63 year old male with altered mental status.   EXAM: CT HEAD WITHOUT CONTRAST   TECHNIQUE: Contiguous axial images were obtained from the base of the skull through the vertex without intravenous contrast.   RADIATION DOSE REDUCTION: This exam was performed according to the departmental dose-optimization program which includes automated exposure control, adjustment of the mA and/or kV according to patient size and/or use of iterative reconstruction technique.   COMPARISON:  Brain MRI and head CT 05/25/2023.   FINDINGS: Brain:   Unusual extensive, diffuse pachymeningeal calcification which has been present since at least 2013. Stable cerebral volume. No midline shift, ventriculomegaly, mass effect, evidence of mass lesion, intracranial hemorrhage or evidence of cortically based acute infarction. Small area of chronic encephalomalacia in the medial left occipital lobe redemonstrated. Stable gray-white matter differentiation throughout the brain.   Vascular: Chronic severe pachymeningeal calcification.   Skull: Appears stable.  No acute osseous abnormality identified.   Sinuses/Orbits: Continued left middle ear and mastoid opacification. Other Visualized paranasal sinuses and mastoids are stable and well aerated.   Other: Anterior left scalp soft tissue injury has  regressed since earlier in the month. Orbits soft tissues appears stable.   IMPRESSION: 1. No acute intracranial abnormality. 2. 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 3. Ongoing left middle ear and mastoid opacification. No acute osseous abnormality identified. 4. Regressed scalp soft tissue injury since 05/25/2023.  CLINICAL DATA:  Chronic bilateral knee pain and swelling.   EXAM: RIGHT KNEE - COMPLETE 4+ VIEW; LEFT KNEE - COMPLETE 4+ VIEW   COMPARISON:  Right knee radiographs dated 12/26/2021.   FINDINGS: Right knee:   No acute fracture or dislocation. There is a small to moderate-sized joint effusion. Moderate tricompartmental degenerative changes with joint space narrowing and marginal osteophytosis. Vascular calcifications are noted.   Left knee:   No acute fracture or dislocation. There is a small joint effusion. Moderate tricompartmental degenerative changes with  joint space narrowing and marginal osteophytosis. Vascular calcifications are noted.   IMPRESSION: 1. No acute osseous abnormality. 2. 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  Narrative & Impression  CLINICAL DATA:  Chronic bilateral knee pain and swelling.   EXAM: RIGHT KNEE - COMPLETE 4+ VIEW; LEFT KNEE - COMPLETE 4+ VIEW   COMPARISON:  Right knee radiographs dated 12/26/2021.   FINDINGS: Right knee:   No acute fracture or dislocation. There is a small to moderate-sized joint effusion. Moderate tricompartmental degenerative changes with joint space narrowing and marginal osteophytosis. Vascular calcifications are noted.   Left knee:   No acute fracture or dislocation. There is a small joint effusion. Moderate tricompartmental degenerative changes with joint space narrowing and marginal osteophytosis. Vascular calcifications are noted.    IMPRESSION: 1. No acute osseous abnormality. 2. 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    Narrative & Impression CLINICAL DATA:  Right hip pain.   EXAM: DG HIP (WITH OR WITHOUT PELVIS) 2-3V RIGHT   COMPARISON:  None Available.   FINDINGS: No acute fracture or dislocation. The bones are osteopenic. Cystic changes of the right femoral head and neck measure up to 3.7 cm. Orthopedic referral is advised. Mild arthritic changes of the right hip. Total left hip arthroplasty. The soft tissues are unremarkable. Vascular calcifications.   IMPRESSION: 1. No acute fracture or dislocation. 2. Cystic changes of the right femoral head and neck. Orthopedic referral is advised.  Lower Venous DVT Study   Patient Name:  Carl Renbarger Sr.  Date of Exam:   06/10/2023  Medical Rec #: [id number]            Accession #:    [id number]  Date of Birth: [birth year]             Patient Gender: M  Patient Age:   25 years  Exam Location:  [facility]  Procedure:      VAS US  LOWER EXTREMITY VENOUS (DVT)  Referring Phys: PRENTICE COMPTON    - PATIENT SURVEYS:  PSFS: THE PATIENT SPECIFIC FUNCTIONAL SCALE  Place score of 0-10 (0 = unable to perform activity and 10 = able to perform activity at the same level as before injury or problem)  Activity Date: eval 09/12/23 11/07/23 11/28/23 01/14/24  Getting in and out of car (anything lower than 20 inches)  0 0  7  2. Balance  3 5  7   3. Walking without a device  2 5  0  4.  Social activities  0 0  5  Total Score 1.25 2.5  4.75    Total Score = Sum of activity scores/number of activities  Minimally Detectable Change: 3 points (for single activity); 2 points (for average score)  [facility] (nd). The Patient Specific Functional Scale . Retrieved from [email or web address]   DGI Moderate impairment: walks  20', slow speed, abnormal gait patterns, evidence for imbalance. (1) Moderate impairment: Makes only minor adjustments to walking speed, or accomplishes a change in speed with significant gait deviations, or changes speed but loses balance but is able to recover and continue walking. (1) Mild impairment: Performs head turns smoothly with slight change in gait velocity, i.e. minor disruption to smooth gait path or uses walking aid. (2) Mild impairment: Performs head turns smoothly with slight change in gait velocity, i.e. minor disruption to smooth gait path or uses walking aid. (2) Moderate  impairment: Turns slowly, requires verbal cueing, requires several small steps to catch balance following turn and stop. (1) Severe impairment: Cannot perform without assistance. (0) Moderate impairment: Is able to clear cones but must significantly slow speed to accomplish task, or requires verbal cueing. (1) Moderate impairment: Two feet to a stair, must use rail. (1) Total Score: 9/24 Scores below 19 suggest that the subject assessed has a higher risk of prospective falls   COGNITION: Overall cognitive status: Within functional limits for tasks assessed     SENSATION: Hx of neuropathy even before fall/surgery     LOWER EXTREMITY MMT:  MMT Right eval Left eval Right 10/24/23 Left 10/24/23 RT/Left 10/31/23 RT/L 11/05/23 RT/L 11/19/23 RT/L 11/28/23 R/L 12/17/23 R/L 12/24/23 Rt 01/02/24 R/L 01/14/24  Hip flexion 3- 3+ 3 3+ 3/3+ 3+/4 4-/4+ 4-/4+ 3+/4 3+/4 3+/4 3+, 4-  Hip extension              Hip abduction            3, 3  Hip adduction              Hip internal rotation              Hip external rotation              Knee flexion 3- 4+ 4- 5 4/5 4/5 5/5  5/5 5/5  4+, 5  Knee extension 3- 5 3- 5 3-/5 3-/5 3/5 3p!/5 3+ *P/4- 4-/4 4/4 4+, 5  Ankle dorsiflexion 4 5 5 5           Ankle plantarflexion              Ankle inversion              Ankle eversion               (Blank rows = not tested)  UPPER  EXTREMITY MMT: R/L  Flexion: 3+/5, 3+/5 Extension: 4/5, 4+/5 Add: 4+/5, 4+/5 Abd: 3+/5, 4-/5   GAIT: Distance walked: in clinic distances  Assistive device utilized: Environmental Consultant - 2 wheeled Level of assistance: Modified independence Comments: slow but steady with RW                                                                                                                                TREATMENT DATE:  01/16/24 NuStep L 5 x 5 min Sit to stand x10 elevated mat LUE pushing on LLE Bridges 2x10 SLR 2x10 each LE on pball bridges, K2C 2x10 Standing shoulder Ext red x15  01/14/24 PT POC reviewed, goals assessed and updated to reflect current status  01/02/24 Nustep L 6 7 min LE only Leg press ( cued to work = pressure BIL) 70# 10 x 3 sets  SL 20lb x10 each, Assist needed w/ RLE Standing march 2lb in RW 2x10 LAQ 2lb cues to for full Fom 2x10 Step up 6 in with RW 10 x  CGA with assist for full TKE    12/26/23 Nustep L 6 Leg press ( cued to work = pressure BIL) 70# 10 x 3 sets  SL 20lb x10 each, Assist needed w/ RLE Standing march 2lb in RW 2x10 LAQ 2lb cues to for full Fom 2x10 4in step ups x10 each 2 rails  6in x 10  12/24/23 Nustep L 6 LE only MMT -see chart HS curls 35# 2 sets 10 SL 10 x each 15# Leg press ( cued to work = pressure BIL) 70# 10 x 3 sets LAQ 3# 3 sets 10 cued for full TKE Mini squats with RW and resisted TKE 2 sets 10 6 inch step up 10 x each with UE work on full [employer] ball core stab ex- seated and standing  12/19/23 Leg press ( cued to work = pressure BIL)60 # 10 , 70# 10 x LAQ 3# 2x10 HS curls green 2x10 STS elevated mat 2 sets 5 with cuing to lean fwd and push thru legs and bend knees to sit vs lean back and brace with back of leg Nustep L 6 LE only 6 inch step up 10 x each with UE work on full [employer] ball core stab ex- seated and standing    12/17/23: Nustep Lvl 5 for 7 min MMTs of the BLE (see above for results) Sit>Stand  from elevated surface 2 x 8 Seated Trunk Rotations with Red Medball 2 x 10 each side Modified Sit Up edge of plinth 2 x 10 Seated Marches with green band around feet 2 x 8 each Seated Clamshells with green Tband 2 x 12  11/28/23 Nustep L 5 7 min LE Only S2S form elevated mat with UE 2x10 Leg press 50lb 2x10 LAQ 2lb 2x10 HS curls green 2x10      PATIENT EDUCATION:  Education details: HEP, PT POC, current functional status, activity modifications Person educated: Patient Education method: Explanation Education comprehension: verbalized understanding, returned demonstration, and needs further education  HOME EXERCISE PROGRAM: Educ on getting ankle wts, adjustable up to 5 is rec as we used 3 #- okayed doing seated and standing ex with RW  ASSESSMENT:  CLINICAL IMPRESSION:  Pt enters ~ 15 minute late for today session.  Session focused on posterior chain strengthening. Weakness noted with all supine activity. Limited elevated with bridges, LE externally rotate with K2C.     Patient is a 63 y.o. M who was seen today for physical therapy evaluation and treatment for S14.109S (ICD-10-CM) - Spinal cord injury, cervical region, sequela (HCC) Pt tolerated a initial progression to therapy well. RLE is significantly weaker than R. Pt had log legs and a relatively short torso making it difficult to stand from low surfaces in conjunction with weak LE. Decrease coordination with side steps. No pain during session. Limited ROM with RLE during isolated exercises.  Will make every effort to improve function and work towards goals.   OBJECTIVE IMPAIRMENTS: Abnormal gait, decreased activity tolerance, decreased balance, decreased coordination, decreased knowledge of use of DME, decreased mobility, difficulty walking, decreased strength, postural dysfunction, and pain.   ACTIVITY LIMITATIONS: standing, squatting, stairs, transfers, bed mobility, toileting, dressing, and locomotion  level  PARTICIPATION LIMITATIONS: shopping, community activity, and yard work  PERSONAL FACTORS: Age, Behavior pattern, Education, Fitness, Past/current experiences, Profession, Social background, and Time since onset of injury/illness/exacerbation are also affecting patient's functional outcome.   REHAB POTENTIAL: Good  CLINICAL DECISION MAKING: Evolving/moderate complexity  EVALUATION COMPLEXITY: Moderate  GOALS: Goals reviewed with patient? No  SHORT TERM GOALS: Target date: 10/24/2023   Will be compliant with appropriate progressive HEP  Baseline: Goal status: 10/08/23 MET  2.  Will be able to consistently perform functional STS tranfers from surfaces 20 inches or less  Baseline:  Goal status:10/08/23 on going  10/31/23 progressing still difficult for pt, 11/05/23 progressing still difficult for pt uses his UE, ongoing 11/19/23, Ongoing 11/28/23  Ongoing 12/24/23- still really struggles with this  01/02/24 on going, 01/14/24 inc difficulty without elevated surface 30+in   3.  Will be able to ambulate at least 325ft with quad cane no more than light MinA  Baseline:  Goal status: 10/08/23 progressing  and 10/31/23  12/24/23 progressing  and 01/02/24, 01/14/24 progressing distance and assist RLE instability limits distance for safety, 238ft SBA  4.  MMT to be at least 3+/5 in all tested groups R LE  Baseline:  Goal status: ongoing 10/24/23  and 10/31/23, Progressing 11/19/23, Progressing 11/28/23  MET 12/24/23    LONG TERM GOALS: Target date: 02/11/24   R LE MMT to be at least 4-/5 in all tested groups  Baseline:  Goal status: ongoing 10/24/23, Ongoing 11/05/23, Ongoing 11/19/23; progressing 12/17/23   01/02/24 progressing, 01/14/24-progressing  2.  Will score at least 18 on DGI with no device to show reduced fall risk  01/14/24: 9/24 Goal status: INITIAL  3.  Will be able to reciprocally ascend/descend steps without difficulty and U rail  Baseline:  Goal status: Ongoing 10/24/23,  11/28/23 and 12/24/23 needs BIL, 01/14/24 inc reliance on BUE HR   4.  Will be able to ambulate community distances with LRAD, Mod(I) to allow for improved community access and participating in social activities  Baseline:  Goal status: Progressing 11/05/23, progressing 01/14/24 with RW  5.  Will be compliant with appropriate gym based exercise program by time of DC  Baseline:  Goal status: reports going to gym some with his son , using bicycle., Progressing 11/05/23 goes with his son; hasn't been recently 12/17/23, 12/2/5, continue to progress towards increased options for workout routine at gym   PLAN:  PT FREQUENCY: 2x/week  PT DURATION: 12 weeks  PLANNED INTERVENTIONS: 97750- Physical Performance Testing, 97110-Therapeutic exercises, 97530- Therapeutic activity, 97112- Neuromuscular re-education, 97535- Self Care, [id number]- Manual therapy, and 97116- Gait training  PLAN FOR NEXT SESSION: progress func strength, gait and balance.  Tanda Sorrow, PTA 01/16/24 8:19 AM [place]

## 2024-01-21 ENCOUNTER — Ambulatory Visit: Admitting: Physical Therapy

## 2024-01-21 ENCOUNTER — Encounter: Payer: Self-pay | Admitting: Physical Therapy

## 2024-01-21 DIAGNOSIS — R262 Difficulty in walking, not elsewhere classified: Secondary | ICD-10-CM | POA: Diagnosis not present

## 2024-01-21 DIAGNOSIS — R2681 Unsteadiness on feet: Secondary | ICD-10-CM

## 2024-01-21 DIAGNOSIS — M6281 Muscle weakness (generalized): Secondary | ICD-10-CM

## 2024-01-21 NOTE — Therapy (Signed)
 OUTPATIENT PHYSICAL THERAPY LOWER EXTREMITY TREATMENT   Patient Name: Carl Nyce Sr. MRN: [id number] DOB:1960-03-10, 63 y.o., male Today's Date: 01/21/2024  END OF SESSION:  PT End of Session - 01/21/24 0857     Visit Number 22    Number of Visits 25    Date for Recertification  02/11/24    Authorization Type UHC Dual    Authorization Time Period 09/12/23 to 12/05/23    PT Start Time 0854    PT Stop Time 0938    PT Time Calculation (min) 44 min    Activity Tolerance Patient tolerated treatment well    Behavior During Therapy [facility] for tasks assessed/performed             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    Parathyroid  abnormality    ectopic parathyroid  gland   Presence of arteriovenous fistula for hemodialysis, primary    RUE PER PT RLE   Refusal of blood product    NO WHOLE BLOOD PROUCTS   Renal cell carcinoma (HCC)    s/p hand assisted laparoscopic bilateral nephrectomies 11/29/17, + RCC left   Secondary hyperparathyroidism    Seizures (HCC)    one episode in past, due to elevated Potassium 08/02/20- at least 4 years ago   Sleep apnea    doesn't use CPAP anymore since weight loss   Stroke [facility])    no residual   Past Surgical History:  Procedure Laterality Date   ABDOMINAL AORTOGRAM W/LOWER EXTREMITY N/A 12/09/2023   Procedure: ABDOMINAL AORTOGRAM W/LOWER EXTREMITY;  Surgeon: Sheree Penne Bruckner, MD;  Location: [facility]  INVASIVE CV LAB;  Service: Cardiovascular;  Laterality: N/A;   ANTERIOR CERVICAL CORPECTOMY N/A 05/25/2023   Procedure: ANTERIOR CERVICAL CORPECTOMY CERVICAL FOUR, CERVICAL THREE - CERVICAL FIVE FUSION;  Surgeon: [name] Shove, MD;  Location: [facility] OR;  Service: Neurosurgery;  Laterality: N/A;   AV FISTULA PLACEMENT Right    right arm   BIOPSY  08/01/2019   Procedure: BIOPSY;  Surgeon: Kristie Lamprey, MD;  Location: [facility] ENDOSCOPY;  Service: Endoscopy;;   BIOPSY  11/18/2020   Procedure: BIOPSY;  Surgeon: Stacia Glendia BRAVO, MD;  Location: [facility] ENDOSCOPY;  Service: Gastroenterology;;   BIOPSY  09/13/2021   Procedure: BIOPSY;  Surgeon: Wilhelmenia Aloha Raddle., MD;  Location: [facility] ENDOSCOPY;  Service: Gastroenterology;;   CATARACT EXTRACTION W/ INTRAOCULAR LENS  IMPLANT, BILATERAL     COLON SURGERY     COLONOSCOPY N/A 08/04/2015   Procedure: COLONOSCOPY;  Surgeon: Gordy CHRISTELLA Starch, MD;  Location: [facility] ENDOSCOPY;  Service: Endoscopy;  Laterality: N/A;   COLONOSCOPY  2017   JMP@ [facility]-good prep-mass -recall 1 yr   COLONOSCOPY N/A 09/13/2021   Procedure: COLONOSCOPY;  Surgeon: Mansouraty, Aloha Raddle., MD;  Location: [facility] ENDOSCOPY;  Service: Gastroenterology;  Laterality: N/A;  COLONOSCOPY WITH PROPOFOL  N/A 11/25/2020   Procedure: COLONOSCOPY WITH PROPOFOL ;  Surgeon: Federico Rosario BROCKS, MD;  Location: [facility] ENDOSCOPY;  Service: Gastroenterology;  Laterality: N/A;   COLONOSCOPY WITH PROPOFOL  N/A 09/23/2021   Procedure: COLONOSCOPY WITH PROPOFOL ;  Surgeon: San Sandor GAILS, DO;  Location: [facility] ENDOSCOPY;  Service: Gastroenterology;  Laterality: N/A;   ENTEROSCOPY N/A 09/23/2021   Procedure: ENTEROSCOPY;  Surgeon: San Sandor GAILS, DO;  Location: [facility] ENDOSCOPY;  Service: Gastroenterology;  Laterality: N/A;  Will place order for video capsule study as we may opt to place it during procedure   ESOPHAGOGASTRODUODENOSCOPY N/A 08/01/2019   Procedure: ESOPHAGOGASTRODUODENOSCOPY (EGD);  Surgeon: Kristie Lamprey, MD;  Location: [facility] ENDOSCOPY;   Service: Endoscopy;  Laterality: N/A;   ESOPHAGOGASTRODUODENOSCOPY N/A 11/18/2020   Procedure: ESOPHAGOGASTRODUODENOSCOPY (EGD);  Surgeon: Stacia Glendia BRAVO, MD;  Location: [facility] ENDOSCOPY;  Service: Gastroenterology;  Laterality: N/A;   ESOPHAGOGASTRODUODENOSCOPY (EGD) WITH PROPOFOL  N/A 08/04/2019   Procedure: ESOPHAGOGASTRODUODENOSCOPY (EGD) WITH PROPOFOL ;  Surgeon: Leigh Elspeth SQUIBB, MD;  Location: [facility] ENDOSCOPY;  Service: Gastroenterology;  Laterality: N/A;   ESOPHAGOGASTRODUODENOSCOPY (EGD) WITH PROPOFOL  N/A 09/13/2021   Procedure: ESOPHAGOGASTRODUODENOSCOPY (EGD) WITH PROPOFOL ;  Surgeon: Wilhelmenia Aloha Raddle., MD;  Location: [facility] ENDOSCOPY;  Service: Gastroenterology;  Laterality: N/A;   GIVENS CAPSULE STUDY N/A 09/23/2021   Procedure: GIVENS CAPSULE STUDY;  Surgeon: San Sandor GAILS, DO;  Location: [facility] ENDOSCOPY;  Service: Gastroenterology;  Laterality: N/A;   graft left arm Left    for dialysis x 2. Removed   HOT HEMOSTASIS  11/18/2020   Procedure: HOT HEMOSTASIS (ARGON PLASMA COAGULATION/BICAP);  Surgeon: Stacia Glendia BRAVO, MD;  Location: [facility] ENDOSCOPY;  Service: Gastroenterology;;   HOT HEMOSTASIS N/A 09/23/2021   Procedure: HOT HEMOSTASIS (ARGON PLASMA COAGULATION/BICAP);  Surgeon: San Sandor GAILS, DO;  Location: [facility] ENDOSCOPY;  Service: Gastroenterology;  Laterality: N/A;   INSERTION OF DIALYSIS CATHETER     Rt chest   LAPAROSCOPIC RIGHT COLECTOMY N/A 08/05/2015   Procedure: LAPAROSCOPIC RIGHT COLECTOMY- ASCENDING;  Surgeon: Jina Nephew, MD;  Location: [facility] OR;  Service: General;  Laterality: N/A;   LOWER EXTREMITY ANGIOGRAPHY N/A 12/09/2023   Procedure: Lower Extremity Angiography;  Surgeon: Sheree Penne Bruckner, MD;  Location: [facility] INVASIVE CV LAB;  Service: Cardiovascular;  Laterality: N/A;   LOWER EXTREMITY INTERVENTION N/A 12/09/2023   Procedure: LOWER EXTREMITY INTERVENTION;  Surgeon: Sheree Penne Bruckner, MD;  Location: [name] INVASIVE CV LAB;  Service: Cardiovascular;   Laterality: N/A;   MASS EXCISION Left 05/28/2019   Procedure: EXCISION SOFT TISSUE MASS LEFT SHOULDER;  Surgeon: Eletha Boas, MD;  Location: [facility] ORS;  Service: General;  Laterality: Left;   NEPHRECTOMY Bilateral    PARATHYROIDECTOMY N/A 06/12/2016   Procedure: TOTAL PARATHYROIDECTOMY WITH AUTOTRANSPLANTATION TO LEFT FOREARM;  Surgeon: Boas Eletha, MD;  Location: [facility] OR;  Service: General;  Laterality: N/A;   PARATHYROIDECTOMY N/A 10/23/2016   Procedure: PARATHYROIDECTOMY;  Surgeon: Eletha Boas, MD;  Location: [facility] OR;  Service: General;  Laterality: N/A;   REVERSE SHOULDER ARTHROPLASTY Right 08/24/2020   Procedure: REVERSE SHOULDER ARTHROPLASTY;  Surgeon: Cristy Bonner DASEN, MD;  Location: [facility] OR;  Service: Orthopedics;  Laterality: Right;   REVISON OF ARTERIOVENOUS FISTULA Right 07/16/2017   Procedure: REVISION OF ARTERIOVENOUS FISTULA  Right ARM;  Surgeon: Sheree Penne Bruckner, MD;  Location: [facility] OR;  Service: Vascular;  Laterality: Right;   TOTAL HIP ARTHROPLASTY Left 11/14/2020   Procedure: LEFT TOTAL HIP ARTHROPLASTY ANTERIOR APPROACH;  Surgeon: [name] Kay HERO, MD;  Location: [facility] OR;  Service: Orthopedics;  Laterality: Left;  UPPER GASTROINTESTINAL ENDOSCOPY  2021   @ [facility]   Patient Active Problem List   Diagnosis Date Noted   Right knee buckling 11/15/2023   Primary osteoarthritis of right knee 11/15/2023   Abnormality of gait 11/15/2023   Primary osteoarthritis of left knee 10/03/2023   Right hip pain 07/14/2023   Coping style affecting medical condition 06/05/2023   Spinal cord injury, cervical region, sequela 05/30/2023   Cervical vertebral fracture (HCC) 05/25/2023   History of GI bleed 05/25/2023   C4 cervical fracture (HCC) 05/25/2023   Duodenitis    Gastritis and gastroduodenitis    Ischemic colitis    Bright red blood per rectum    ABLA (acute blood loss anemia)    Gastric ulcer    Duodenal ulcer    Diverticulosis of colon without hemorrhage    GI bleed 09/17/2021   Acute  upper GI bleeding 09/11/2021   Mucosal abnormality of esophagus    Mucosal abnormality of stomach    Mucosal abnormality of duodenum    Upper GI bleed    Left hip pain 11/16/2020   Type 2 diabetes mellitus with other specified complication (HCC) 11/14/2020   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/14/2020   Primary osteoarthritis of left hip 10/07/2020   Status post reverse total arthroplasty of right shoulder 08/24/2020   Diverticulitis 03/08/2020   ESRD (end stage renal disease) (HCC) 03/08/2020   Polyneuropathy in diseases classified elsewhere 03/08/2020   Pure hypercholesterolemia 03/08/2020   Sacral back pain 03/08/2020   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 11/10/2019   Acute gastric ulcer with hemorrhage    Acute GI bleeding 07/31/2019   Hypotension of hemodialysis 07/31/2019   Diverticulitis large intestine 07/31/2019   Mass of soft tissue of shoulder 05/26/2019   Awaiting organ transplant status 03/18/2019   Finding of cocaine in blood 02/25/2019   Anaphylactic shock, unspecified, sequela 11/21/2018   Hypercalcemia 10/28/2018   Diarrhea, unspecified 08/08/2018   Hyperkalemia 03/12/2018   Renal cell carcinoma of left kidney (HCC) 12/24/2017   Fluid overload, unspecified 07/06/2017   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   Other specified disorders of kidney and ureter 11/22/2016   Secondary hyperparathyroidism of renal origin 10/23/2016   Headache disorder 08/21/2016   TIA (transient ischemic attack)    Essential hypertension    Seizure (HCC)    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    Lung nodule    Diverticulitis of cecum 07/21/2015   Diverticulitis of large intestine without perforation or abscess without bleeding    Right lower quadrant pain    Obstructive sleep apnea 02/08/2015   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   Diverticulosis 07/13/2014   Lower GI bleed 07/12/2014   Refusal of blood transfusions as patient is  Jehov[employer]'s Witness    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   HLD (hyperlipidemia)    PRES (posterior reversible encephalopathy syndrome)    History of CVA (cerebrovascular accident) 05/23/2014   Anemia of chronic disease 05/23/2014   GERD (gastroesophageal reflux disease) 12/09/2012   Gastrointestinal bleeding 11/12/2012   Melena 11/12/2012   Other psychoactive substance dependence, uncomplicated (HCC) 10/08/2012   Iron deficiency anemia, unspecified 06/10/2012   Moderate protein-calorie malnutrition 07/29/2009   Coagulation defect, unspecified 08/29/2006   Type 2 diabetes mellitus with diabetic peripheral angiopathy without gangrene (HCC) 08/29/2006   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 stage renal disease (HCC) 08/02/2006  PCP: [name] MD   REFERRING PROVIDER: Debby Fidela CROME, NP  REFERRING DIAG:  Diagnosis  S14.109S (ICD-10-CM) - Spinal cord injury, cervical region, sequela (HCC)    THERAPY DIAG:  Difficulty in walking, not elsewhere classified  Muscle weakness (generalized)  Unsteadiness on feet  Rationale for Evaluation and Treatment: Rehabilitation  ONSET DATE:  ACDF 05/25/23  SUBJECTIVE:   SUBJECTIVE STATEMENT:   Pt states that he dons bilateral knee braces with lateral support and hinge x1 week which he reports is uncomfortable, but tolerable and would like to see how he does with them.    PERTINENT HISTORY: See above   ACDF C3-C5 fusion, anterior cervical corpectomy C3-C4  PAIN:  Are you having pain? 5/10 R knee  PRECAUTIONS:  Precautions:  Precautions Precautions: Fall, Cervical, Other (comment) Recall of Precautions/Restrictions: Impaired Precaution/Restrictions Comments: Verbally reviewed cervical precautions. Required Braces or Orthoses: Cervical Brace Cervical Brace: Soft collar, At all times Restrictions Weight Bearing Restrictions Per  Provider Order: No General: Pain: No c/o pain.   RED FLAGS: None   WEIGHT BEARING RESTRICTIONS: No  FALLS:  Has patient fallen in last 6 months? Yes. Number of falls 3- one that led to surgery, then 2 after getting home from the hospital  LIVING ENVIRONMENT: Lives with: lives with their son Lives in: House/apartment Stairs: ground floor apt  Has following equipment at home: Single point cane, Environmental Consultant - 2 wheeled, and Environmental Consultant - 4 wheeled  OCCUPATION: retired- used to pressure wash   PLOF: Independent, Independent with basic ADLs, Independent with gait, and Independent with transfers  PATIENT GOALS: get back to normal ability, drive convertible again   NEXT MD VISIT: Dr. Cornelio 11/15/23  OBJECTIVE:  Note: Objective measures were completed at Evaluation unless otherwise noted.  DIAGNOSTIC FINDINGS:    CLINICAL DATA:  63 year old male with altered mental status.   EXAM: CT HEAD WITHOUT CONTRAST   TECHNIQUE: Contiguous axial images were obtained from the base of the skull through the vertex without intravenous contrast.   RADIATION DOSE REDUCTION: This exam was performed according to the departmental dose-optimization program which includes automated exposure control, adjustment of the mA and/or kV according to patient size and/or use of iterative reconstruction technique.   COMPARISON:  Brain MRI and head CT 05/25/2023.   FINDINGS: Brain:   Unusual extensive, diffuse pachymeningeal calcification which has been present since at least 2013. Stable cerebral volume. No midline shift, ventriculomegaly, mass effect, evidence of mass lesion, intracranial hemorrhage or evidence of cortically based acute infarction. Small area of chronic encephalomalacia in the medial left occipital lobe redemonstrated. Stable gray-white matter differentiation throughout the brain.   Vascular: Chronic severe pachymeningeal calcification.   Skull: Appears stable.  No acute osseous  abnormality identified.   Sinuses/Orbits: Continued left middle ear and mastoid opacification. Other Visualized paranasal sinuses and mastoids are stable and well aerated.   Other: Anterior left scalp soft tissue injury has regressed since earlier in the month. Orbits soft tissues appears stable.   IMPRESSION: 1. No acute intracranial abnormality. 2. 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 3. Ongoing left middle ear and mastoid opacification. No acute osseous abnormality identified. 4. Regressed scalp soft tissue injury since 05/25/2023.  CLINICAL DATA:  Chronic bilateral knee pain and swelling.   EXAM: RIGHT KNEE - COMPLETE 4+ VIEW; LEFT KNEE - COMPLETE 4+ VIEW   COMPARISON:  Right knee radiographs dated 12/26/2021.   FINDINGS: Right knee:   No acute fracture or dislocation. There  is a small to moderate-sized joint effusion. Moderate tricompartmental degenerative changes with joint space narrowing and marginal osteophytosis. Vascular calcifications are noted.   Left knee:   No acute fracture or dislocation. There is a small joint effusion. Moderate tricompartmental degenerative changes with joint space narrowing and marginal osteophytosis. Vascular calcifications are noted.   IMPRESSION: 1. No acute osseous abnormality. 2. 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  Narrative & Impression  CLINICAL DATA:  Chronic bilateral knee pain and swelling.   EXAM: RIGHT KNEE - COMPLETE 4+ VIEW; LEFT KNEE - COMPLETE 4+ VIEW   COMPARISON:  Right knee radiographs dated 12/26/2021.   FINDINGS: Right knee:   No acute fracture or dislocation. There is a small to moderate-sized joint effusion. Moderate tricompartmental degenerative changes with joint space narrowing and marginal osteophytosis. Vascular calcifications are noted.    Left knee:   No acute fracture or dislocation. There is a small joint effusion. Moderate tricompartmental degenerative changes with joint space narrowing and marginal osteophytosis. Vascular calcifications are noted.   IMPRESSION: 1. No acute osseous abnormality. 2. 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    Narrative & Impression CLINICAL DATA:  Right hip pain.   EXAM: DG HIP (WITH OR WITHOUT PELVIS) 2-3V RIGHT   COMPARISON:  None Available.   FINDINGS: No acute fracture or dislocation. The bones are osteopenic. Cystic changes of the right femoral head and neck measure up to 3.7 cm. Orthopedic referral is advised. Mild arthritic changes of the right hip. Total left hip arthroplasty. The soft tissues are unremarkable. Vascular calcifications.   IMPRESSION: 1. No acute fracture or dislocation. 2. Cystic changes of the right femoral head and neck. Orthopedic referral is advised.  Lower Venous DVT Study   Patient Name:  Carl Wann Sr.  Date of Exam:   06/10/2023  Medical Rec #: [id number]            Accession #:    [id number]  Date of Birth: [birth year]             Patient Gender: M  Patient Age:   64 years  Exam Location:  [facility]  Procedure:      VAS US  LOWER EXTREMITY VENOUS (DVT)  Referring Phys: PRENTICE COMPTON    - PATIENT SURVEYS:  PSFS: THE PATIENT SPECIFIC FUNCTIONAL SCALE  Place score of 0-10 (0 = unable to perform activity and 10 = able to perform activity at the same level as before injury or problem)  Activity Date: eval 09/12/23 11/07/23 11/28/23 01/14/24  Getting in and out of car (anything lower than 20 inches)  0 0  7  2. Balance  3 5  7   3. Walking without a device  2 5  0  4.  Social activities  0 0  5  Total Score 1.25 2.5  4.75    Total Score = Sum of activity scores/number of activities  Minimally Detectable Change: 3 points (for single activity);  2 points (for average score)  [facility] (nd). The Patient Specific Functional Scale . Retrieved from [email or web address]   DGI Moderate impairment: walks 20', slow speed, abnormal gait patterns, evidence for imbalance. (1) Moderate impairment: Makes only minor adjustments to walking speed, or accomplishes a change in speed with significant gait deviations, or changes speed but loses balance but is able to recover and continue walking. (1) Mild impairment: Performs head turns  smoothly with slight change in gait velocity, i.e. minor disruption to smooth gait path or uses walking aid. (2) Mild impairment: Performs head turns smoothly with slight change in gait velocity, i.e. minor disruption to smooth gait path or uses walking aid. (2) Moderate impairment: Turns slowly, requires verbal cueing, requires several small steps to catch balance following turn and stop. (1) Severe impairment: Cannot perform without assistance. (0) Moderate impairment: Is able to clear cones but must significantly slow speed to accomplish task, or requires verbal cueing. (1) Moderate impairment: Two feet to a stair, must use rail. (1) Total Score: 9/24 Scores below 19 suggest that the subject assessed has a higher risk of prospective falls   COGNITION: Overall cognitive status: Within functional limits for tasks assessed     SENSATION: Hx of neuropathy even before fall/surgery     LOWER EXTREMITY MMT:  MMT Right eval Left eval Right 10/24/23 Left 10/24/23 RT/Left 10/31/23 RT/L 11/05/23 RT/L 11/19/23 RT/L 11/28/23 R/L 12/17/23 R/L 12/24/23 Rt 01/02/24 R/L 01/14/24  Hip flexion 3- 3+ 3 3+ 3/3+ 3+/4 4-/4+ 4-/4+ 3+/4 3+/4 3+/4 3+, 4-  Hip extension              Hip abduction            3, 3  Hip adduction              Hip internal rotation              Hip external rotation              Knee flexion 3- 4+ 4- 5 4/5 4/5 5/5  5/5 5/5  4+, 5  Knee  extension 3- 5 3- 5 3-/5 3-/5 3/5 3p!/5 3+ *P/4- 4-/4 4/4 4+, 5  Ankle dorsiflexion 4 5 5 5           Ankle plantarflexion              Ankle inversion              Ankle eversion               (Blank rows = not tested)  UPPER EXTREMITY MMT: R/L  Flexion: 3+/5, 3+/5 Extension: 4/5, 4+/5 Add: 4+/5, 4+/5 Abd: 3+/5, 4-/5   GAIT: Distance walked: in clinic distances  Assistive device utilized: Environmental Consultant - 2 wheeled Level of assistance: Modified independence Comments: slow but steady with RW                                                                                                                                TREATMENT DATE:  01/21/24 NuStep x7 mins level 5 seat 15 UE position 12 Gait training on TM unilateral walking x2 mins each LE unilateral gait training Bridging 3x10 hooklying with 3 sec hold Education related to home safety and completion of TM walking at home   01/16/24 NuStep L 5 x 5 min Sit to stand x10  elevated mat LUE pushing on LLE Bridges 2x10 SLR 2x10 each LE on pball bridges, K2C 2x10 Standing shoulder Ext red x15  01/14/24 PT POC reviewed, goals assessed and updated to reflect current status  01/02/24 Nustep L 6 7 min LE only Leg press ( cued to work = pressure BIL) 70# 10 x 3 sets  SL 20lb x10 each, Assist needed w/ RLE Standing march 2lb in RW 2x10 LAQ 2lb cues to for full Fom 2x10 Step up 6 in with RW 10 x CGA with assist for full TKE    12/26/23 Nustep L 6 Leg press ( cued to work = pressure BIL) 70# 10 x 3 sets  SL 20lb x10 each, Assist needed w/ RLE Standing march 2lb in RW 2x10 LAQ 2lb cues to for full Fom 2x10 4in step ups x10 each 2 rails  6in x 10  12/24/23 Nustep L 6 LE only MMT -see chart HS curls 35# 2 sets 10 SL 10 x each 15# Leg press ( cued to work = pressure BIL) 70# 10 x 3 sets LAQ 3# 3 sets 10 cued for full TKE Mini squats with RW and resisted TKE 2 sets 10 6 inch step up 10 x each with UE work on full [employer]  ball core stab ex- seated and standing  12/19/23 Leg press ( cued to work = pressure BIL)60 # 10 , 70# 10 x LAQ 3# 2x10 HS curls green 2x10 STS elevated mat 2 sets 5 with cuing to lean fwd and push thru legs and bend knees to sit vs lean back and brace with back of leg Nustep L 6 LE only 6 inch step up 10 x each with UE work on full [employer] ball core stab ex- seated and standing    12/17/23: Nustep Lvl 5 for 7 min MMTs of the BLE (see above for results) Sit>Stand from elevated surface 2 x 8 Seated Trunk Rotations with Red Medball 2 x 10 each side Modified Sit Up edge of plinth 2 x 10 Seated Marches with green band around feet 2 x 8 each Seated Clamshells with green Tband 2 x 12  11/28/23 Nustep L 5 7 min LE Only S2S form elevated mat with UE 2x10 Leg press 50lb 2x10 LAQ 2lb 2x10 HS curls green 2x10      PATIENT EDUCATION:  Education details: HEP, PT POC, current functional status, activity modifications Person educated: Patient Education method: Explanation Education comprehension: verbalized understanding, returned demonstration, and needs further education  HOME EXERCISE PROGRAM: Educ on getting ankle wts, adjustable up to 5 is rec as we used 3 #- okayed doing seated and standing ex with RW  Access Code: [email or web address] URL: [email or web address] Date: 01/21/2024 Prepared by: Stann Ohara  Exercises - Walking on Treadmill  - 1 x daily - 4 x weekly  ASSESSMENT:  CLINICAL IMPRESSION: Pt has been responding well to his current regimen, dons bilateral knee braces with lateral support from the arthritis clinic. Braces assessed for fitment due to pt reports of discomfort, no issues noted with alignment. Pt able to progress endurance and gait training today. Educated on home safety and progression of exercises with his sons help. Pt stands to benefit from continued skilled physical therapy to address deficit areas and restore safety with activities and  participations at home and in the community.        Patient is a 63 y.o. M who was seen today for physical therapy evaluation and treatment for  S14.109S (ICD-10-CM) - Spinal cord injury, cervical region, sequela (HCC) Pt tolerated a initial progression to therapy well. RLE is significantly weaker than R. Pt had log legs and a relatively short torso making it difficult to stand from low surfaces in conjunction with weak LE. Decrease coordination with side steps. No pain during session. Limited ROM with RLE during isolated exercises.  Will make every effort to improve function and work towards goals.   OBJECTIVE IMPAIRMENTS: Abnormal gait, decreased activity tolerance, decreased balance, decreased coordination, decreased knowledge of use of DME, decreased mobility, difficulty walking, decreased strength, postural dysfunction, and pain.   ACTIVITY LIMITATIONS: standing, squatting, stairs, transfers, bed mobility, toileting, dressing, and locomotion level  PARTICIPATION LIMITATIONS: shopping, community activity, and yard work  PERSONAL FACTORS: Age, Behavior pattern, Education, Fitness, Past/current experiences, Profession, Social background, and Time since onset of injury/illness/exacerbation are also affecting patient's functional outcome.   REHAB POTENTIAL: Good  CLINICAL DECISION MAKING: Evolving/moderate complexity  EVALUATION COMPLEXITY: Moderate   GOALS: Goals reviewed with patient? No  SHORT TERM GOALS: Target date: 10/24/2023   Will be compliant with appropriate progressive HEP  Baseline: Goal status: 10/08/23 MET  2.  Will be able to consistently perform functional STS tranfers from surfaces 20 inches or less  Baseline:  Goal status:10/08/23 on going  10/31/23 progressing still difficult for pt, 11/05/23 progressing still difficult for pt uses his UE, ongoing 11/19/23, Ongoing 11/28/23  Ongoing 12/24/23- still really struggles with this  01/02/24 on going, 01/14/24 inc difficulty  without elevated surface 30+in   3.  Will be able to ambulate at least 350ft with quad cane no more than light MinA  Baseline:  Goal status: 10/08/23 progressing  and 10/31/23  12/24/23 progressing  and 01/02/24, 01/14/24 progressing distance and assist RLE instability limits distance for safety, 223ft SBA  4.  MMT to be at least 3+/5 in all tested groups R LE  Baseline:  Goal status: ongoing 10/24/23  and 10/31/23, Progressing 11/19/23, Progressing 11/28/23  MET 12/24/23    LONG TERM GOALS: Target date: 02/11/24   R LE MMT to be at least 4-/5 in all tested groups  Baseline:  Goal status: ongoing 10/24/23, Ongoing 11/05/23, Ongoing 11/19/23; progressing 12/17/23   01/02/24 progressing, 01/14/24-progressing  2.  Will score at least 18 on DGI with no device to show reduced fall risk  01/14/24: 9/24 Goal status: INITIAL  3.  Will be able to reciprocally ascend/descend steps without difficulty and U rail  Baseline:  Goal status: Ongoing 10/24/23, 11/28/23 and 12/24/23 needs BIL, 01/14/24 inc reliance on BUE HR   4.  Will be able to ambulate community distances with LRAD, Mod(I) to allow for improved community access and participating in social activities  Baseline:  Goal status: Progressing 11/05/23, progressing 01/14/24 with RW  5.  Will be compliant with appropriate gym based exercise program by time of DC  Baseline:  Goal status: reports going to gym some with his son , using bicycle., Progressing 11/05/23 goes with his son; hasn't been recently 12/17/23, 12/2/5, continue to progress towards increased options for workout routine at gym   PLAN:  PT FREQUENCY: 2x/week  PT DURATION: 12 weeks  PLANNED INTERVENTIONS: 97750- Physical Performance Testing, 97110-Therapeutic exercises, 97530- Therapeutic activity, 97112- Neuromuscular re-education, 97535- Self Care, 02859- Manual therapy, and 97116- Gait training  PLAN FOR NEXT SESSION: progress func strength, gait and balance.  Stann Ohara PT,  DPT, CLT, CES 01/21/24 9:51 AM

## 2024-01-22 ENCOUNTER — Ambulatory Visit (HOSPITAL_COMMUNITY): Admission: RE | Admit: 2024-01-22 | Discharge: 2024-01-22 | Disposition: A | Attending: Surgery | Admitting: Surgery

## 2024-01-22 ENCOUNTER — Ambulatory Visit (HOSPITAL_BASED_OUTPATIENT_CLINIC_OR_DEPARTMENT_OTHER)
Admit: 2024-01-22 | Discharge: 2024-01-22 | Disposition: A | Discharge status: Home / Self Care | Attending: Surgery | Admitting: Surgery

## 2024-01-22 ENCOUNTER — Encounter: Payer: Self-pay | Admitting: Physician Assistant

## 2024-01-22 ENCOUNTER — Other Ambulatory Visit (HOSPITAL_BASED_OUTPATIENT_CLINIC_OR_DEPARTMENT_OTHER): Payer: Self-pay

## 2024-01-22 ENCOUNTER — Ambulatory Visit

## 2024-01-22 VITALS — BP 109/67 | HR 83 | Temp 98.4°F

## 2024-01-22 DIAGNOSIS — M79606 Pain in leg, unspecified: Secondary | ICD-10-CM | POA: Insufficient documentation

## 2024-01-22 DIAGNOSIS — I70421 Atherosclerosis of autologous vein bypass graft(s) of the extremities with rest pain, right leg: Secondary | ICD-10-CM | POA: Insufficient documentation

## 2024-01-22 LAB — VAS US ABI WITH/WO TBI

## 2024-01-22 NOTE — Progress Notes (Signed)
 Office Note     CC:  follow up Requesting Provider:  [name], MD  HPI: Carl Garbers Sr. is a 63 y.o. (07/25/1960) male who presents status post angiogram with shockwave lithotripsy, drug-coated balloon angioplasty, and stenting of the right SFA and popliteal arteries by Dr. Sheree due to critical limb ischemia and rest pain on 12/09/2023.  Rest pain has resolved since surgery.  He occasionally has pain in his foot and ankle however this is tolerable.  He denies any claudication or tissue loss.  He is ambulatory with a cane.  He is on aspirin , Plavix , statin daily.  He smokes cigars occasionally.  Past medical history also significant for end-stage renal disease on hemodialysis.   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    Parathyroid  abnormality    ectopic parathyroid  gland   Presence of arteriovenous fistula for hemodialysis, primary    RUE PER PT RLE   Refusal of blood product    NO WHOLE BLOOD PROUCTS   Renal cell carcinoma (HCC)    s/p hand assisted laparoscopic bilateral nephrectomies 11/29/17, + RCC left   Secondary hyperparathyroidism    Seizures (HCC)    one episode in past, due to elevated Potassium 08/02/20- at least 4 years ago   Sleep apnea    doesn't use CPAP anymore since weight loss   Stroke [facility])    no residual    Past Surgical History:  Procedure Laterality Date   ABDOMINAL AORTOGRAM W/LOWER EXTREMITY N/A 12/09/2023    Procedure: ABDOMINAL AORTOGRAM W/LOWER EXTREMITY;  Surgeon: Sheree Penne Bruckner, MD;  Location: [facility] INVASIVE CV LAB;  Service: Cardiovascular;  Laterality: N/A;   ANTERIOR CERVICAL CORPECTOMY N/A 05/25/2023   Procedure: ANTERIOR CERVICAL CORPECTOMY CERVICAL FOUR, CERVICAL THREE - CERVICAL FIVE FUSION;  Surgeon: [name] Shove, MD;  Location: [facility] OR;  Service: Neurosurgery;  Laterality: N/A;   AV FISTULA PLACEMENT Right    right arm   BIOPSY  08/01/2019   Procedure: BIOPSY;  Surgeon: Kristie Lamprey, MD;  Location: [facility] ENDOSCOPY;  Service: Endoscopy;;   BIOPSY  11/18/2020   Procedure: BIOPSY;  Surgeon: Stacia Glendia BRAVO, MD;  Location: [facility] ENDOSCOPY;  Service: Gastroenterology;;   BIOPSY  09/13/2021   Procedure: BIOPSY;  Surgeon: Wilhelmenia Aloha Raddle., MD;  Location: [facility] ENDOSCOPY;  Service: Gastroenterology;;   CATARACT EXTRACTION W/ INTRAOCULAR LENS  IMPLANT, BILATERAL     COLON SURGERY     COLONOSCOPY N/A 08/04/2015   Procedure: COLONOSCOPY;  Surgeon: Gordy CHRISTELLA Starch, MD;  Location: [facility] ENDOSCOPY;  Service: Endoscopy;  Laterality: N/A;   COLONOSCOPY  2017   JMP@ [facility]-good prep-mass -recall 1 yr   COLONOSCOPY N/A 09/13/2021   Procedure: COLONOSCOPY;  Surgeon: Mansouraty, Aloha Raddle., MD;  Location: [facility] ENDOSCOPY;  Service: Gastroenterology;  Laterality: N/A;  COLONOSCOPY WITH PROPOFOL  N/A 11/25/2020   Procedure: COLONOSCOPY WITH PROPOFOL ;  Surgeon: Federico Rosario BROCKS, MD;  Location: [facility] ENDOSCOPY;  Service: Gastroenterology;  Laterality: N/A;   COLONOSCOPY WITH PROPOFOL  N/A 09/23/2021   Procedure: COLONOSCOPY WITH PROPOFOL ;  Surgeon: San Sandor GAILS, DO;  Location: [facility] ENDOSCOPY;  Service: Gastroenterology;  Laterality: N/A;   ENTEROSCOPY N/A 09/23/2021   Procedure: ENTEROSCOPY;  Surgeon: San Sandor GAILS, DO;  Location: [facility] ENDOSCOPY;  Service: Gastroenterology;  Laterality: N/A;  Will place order for video capsule study as we may opt to place it during procedure   ESOPHAGOGASTRODUODENOSCOPY N/A  08/01/2019   Procedure: ESOPHAGOGASTRODUODENOSCOPY (EGD);  Surgeon: Kristie Lamprey, MD;  Location: [facility] ENDOSCOPY;  Service: Endoscopy;  Laterality: N/A;   ESOPHAGOGASTRODUODENOSCOPY N/A 11/18/2020   Procedure: ESOPHAGOGASTRODUODENOSCOPY (EGD);  Surgeon: Stacia Glendia BRAVO, MD;  Location: [facility] ENDOSCOPY;  Service: Gastroenterology;  Laterality: N/A;   ESOPHAGOGASTRODUODENOSCOPY (EGD) WITH PROPOFOL  N/A 08/04/2019   Procedure: ESOPHAGOGASTRODUODENOSCOPY (EGD) WITH PROPOFOL ;  Surgeon: Leigh Elspeth SQUIBB, MD;  Location: [facility] ENDOSCOPY;  Service: Gastroenterology;  Laterality: N/A;   ESOPHAGOGASTRODUODENOSCOPY (EGD) WITH PROPOFOL  N/A 09/13/2021   Procedure: ESOPHAGOGASTRODUODENOSCOPY (EGD) WITH PROPOFOL ;  Surgeon: Wilhelmenia Aloha Raddle., MD;  Location: [facility] ENDOSCOPY;  Service: Gastroenterology;  Laterality: N/A;   GIVENS CAPSULE STUDY N/A 09/23/2021   Procedure: GIVENS CAPSULE STUDY;  Surgeon: San Sandor GAILS, DO;  Location: [facility] ENDOSCOPY;  Service: Gastroenterology;  Laterality: N/A;   graft left arm Left    for dialysis x 2. Removed   HOT HEMOSTASIS  11/18/2020   Procedure: HOT HEMOSTASIS (ARGON PLASMA COAGULATION/BICAP);  Surgeon: Stacia Glendia BRAVO, MD;  Location: [facility] ENDOSCOPY;  Service: Gastroenterology;;   HOT HEMOSTASIS N/A 09/23/2021   Procedure: HOT HEMOSTASIS (ARGON PLASMA COAGULATION/BICAP);  Surgeon: San Sandor GAILS, DO;  Location: [facility] ENDOSCOPY;  Service: Gastroenterology;  Laterality: N/A;   INSERTION OF DIALYSIS CATHETER     Rt chest   LAPAROSCOPIC RIGHT COLECTOMY N/A 08/05/2015   Procedure: LAPAROSCOPIC RIGHT COLECTOMY- ASCENDING;  Surgeon: Jina Nephew, MD;  Location: [facility] OR;  Service: General;  Laterality: N/A;   LOWER EXTREMITY ANGIOGRAPHY N/A 12/09/2023   Procedure: Lower Extremity Angiography;  Surgeon: Sheree Penne Bruckner, MD;  Location: [facility] INVASIVE CV LAB;  Service: Cardiovascular;  Laterality: N/A;   LOWER EXTREMITY INTERVENTION N/A 12/09/2023   Procedure: LOWER EXTREMITY  INTERVENTION;  Surgeon: Sheree Penne Bruckner, MD;  Location: [facility] INVASIVE CV LAB;  Service: Cardiovascular;  Laterality: N/A;   MASS EXCISION Left 05/28/2019   Procedure: EXCISION SOFT TISSUE MASS LEFT SHOULDER;  Surgeon: Eletha Boas, MD;  Location: [facility] ORS;  Service: General;  Laterality: Left;   NEPHRECTOMY Bilateral    PARATHYROIDECTOMY N/A 06/12/2016   Procedure: TOTAL PARATHYROIDECTOMY WITH AUTOTRANSPLANTATION TO LEFT FOREARM;  Surgeon: Boas Eletha, MD;  Location: [facility] OR;  Service: General;  Laterality: N/A;   PARATHYROIDECTOMY N/A 10/23/2016   Procedure: PARATHYROIDECTOMY;  Surgeon: Eletha Boas, MD;  Location: [facility] OR;  Service: General;  Laterality: N/A;   REVERSE SHOULDER ARTHROPLASTY Right 08/24/2020   Procedure: REVERSE SHOULDER ARTHROPLASTY;  Surgeon: Cristy Bonner DASEN, MD;  Location: [facility] OR;  Service: Orthopedics;  Laterality: Right;   REVISON OF ARTERIOVENOUS FISTULA Right 07/16/2017   Procedure: REVISION OF ARTERIOVENOUS FISTULA  Right ARM;  Surgeon: Sheree Penne Bruckner, MD;  Location: [facility] OR;  Service: Vascular;  Laterality: Right;   TOTAL HIP ARTHROPLASTY Left 11/14/2020   Procedure: LEFT TOTAL HIP ARTHROPLASTY ANTERIOR APPROACH;  Surgeon: [name] Kay HERO, MD;  Location: [facility] OR;  Service: Orthopedics;  Laterality: Left;  UPPER GASTROINTESTINAL ENDOSCOPY  2021   @ [facility]    Social History   Socioeconomic History   Marital status: Single    Spouse name: Not on file   Number of children: 2   Years of education: Not on file   Highest education level: Not on file  Occupational History   Occupation: retired    Associate Professor: SELF EMPLOYED  Tobacco Use   Smoking status: Former    Current packs/day: 0.00    Types: Cigarettes    Quit date: 06/19/1990    Years since quitting: 33.6   Smokeless tobacco: Never   Tobacco comments:    rarely cigar  Vaping Use   Vaping status: Never Used  Substance and Sexual Activity   Alcohol use: Not Currently    Alcohol/week: 1.0 standard drink of  alcohol    Types: 1 Cans of beer per week    Comment: occasional beer   Drug use: Not Currently    Types: Marijuana    Comment: Last use several yrs ago   Sexual activity: Not Currently  Other Topics Concern   Not on file  Social History Narrative   Lives alone at home with brother   12 years of education    2 children    Not married    Are you right handed or left handed? Rtight   Are you currently employed ? retired   What is your current occupation?   Do you live at home alone?   Who lives with you?    What type of home do you live in: 1 story or 2 story? One   Caffeine  1 cup a day       Social Drivers of Corporate Investment Banker Strain: Not on file  Food Insecurity: No Food Insecurity (06/19/2023)   Hunger Vital Sign    Worried About Running Out of Food in the Last Year: Never true    Ran Out of Food in the Last Year: Never true  Transportation Needs: No Transportation Needs (06/19/2023)   PRAPARE - Administrator, Civil Service (Medical): No    Lack of Transportation (Non-Medical): No  Physical Activity: Not on file  Stress: Not on file  Social Connections: Not on file  Intimate Partner Violence: Not At Risk (06/19/2023)   Humiliation, Afraid, Rape, and Kick questionnaire    Fear of Current or Ex-Partner: No    Emotionally Abused: No    Physically Abused: No    Sexually Abused: No    Family History  Problem Relation Age of Onset   Diabetes Father    Stroke Father    Hypertension Father    Uterine cancer Mother    Lupus Sister    Stroke Sister    Hypertension Sister    Anuerysm Brother        brain   Colon cancer Neg Hx    Esophageal cancer Neg Hx    Stomach cancer Neg Hx    Pancreatic cancer Neg Hx    Liver disease Neg Hx    Colon polyps Neg Hx    Rectal cancer Neg Hx     Current Outpatient Medications  Medication Sig Dispense Refill   acetaminophen  (TYLENOL ) 325 MG tablet Take 1-2 tablets (325-650 mg total) by mouth every 4 (four) hours  as needed for mild pain (pain score 1-3).     amoxicillin  (AMOXIL ) 500 MG capsule TAKE FOUR CAPSULES BY MOUTH ONE HOUR BEFORE DENTAL WORK/PROCEDURE 8 capsule 0  atorvastatin  (LIPITOR) 10 MG tablet Take 1 tablet (10 mg total) by mouth every Thursday. 30 tablet 2   augmented betamethasone  dipropionate (DIPROLENE -AF) 0.05 % ointment Apply 1 Application topically 2 (two) times daily as needed for rash.     calcitRIOL  (ROCALTROL ) 0.5 MCG capsule Take 3 capsules (1.5 mcg total) by mouth every Monday, Wednesday, and Friday with hemodialysis.     camphor-menthol  (SARNA) lotion Apply topically.     cinacalcet  (SENSIPAR ) 60 MG tablet      clopidogrel  (PLAVIX ) 75 MG tablet Take 1 tablet (75 mg total) by mouth daily. 30 tablet 11   cromolyn  (OPTICROM ) 4 % ophthalmic solution Place 1 drop into both eyes daily as needed (redness).     cyclobenzaprine  (FLEXERIL ) 10 MG tablet Take 1 tablet (10 mg total) by mouth 3 (three) times daily as needed for muscle spasms. 30 tablet 0   diphenhydrAMINE  (BENADRYL ) 25 mg capsule Take 1 capsule (25 mg total) by mouth every 6 (six) hours as needed for itching.     gabapentin  (NEURONTIN ) 100 MG capsule Take 3 capsules (300 mg total) by mouth at bedtime. 30 capsule 2   ibuprofen (ADVIL) 800 MG tablet SMARTSIG:1 Tablet(s) By Mouth Every 12 Hours PRN     LOKELMA  10 g PACK packet Take 1 packet by mouth daily.     melatonin 5 MG TABS Take 1 tablet (5 mg total) by mouth at bedtime as needed.     Methoxy PEG-Epoetin  Beta (MIRCERA IJ) 75 mcg.     midodrine  (PROAMATINE ) 10 MG tablet Take 1 tablet (10 mg total) by mouth every dialysis (30 min before each dialysis.). 15 tablet 5   Nystatin (GERHARDT'S BUTT CREAM) CREA Apply 1 Application topically 2 (two) times daily.     oxyCODONE -acetaminophen  (PERCOCET/ROXICET) 5-325 MG tablet Take 1-2 tablets by mouth every 6 (six) hours as needed for severe pain (pain score 7-10) (5mg  for moderate pain and 10 mg for severe pain). 20 tablet 0    pantoprazole  (PROTONIX ) 40 MG tablet Take 1 tablet (40 mg total) by mouth 2 (two) times daily before a meal. (Patient taking differently: Take 40 mg by mouth daily as needed (for acid reflux).) 60 tablet 1   pentafluoroprop-tetrafluoroeth (GEBAUERS) AERO Apply 1 Application topically as needed (topical anesthesia for hemodialysis).     polyethylene glycol powder (GLYCOLAX /MIRALAX ) 17 GM/SCOOP powder Take 17 g by mouth 2 (two) times daily as needed for mild constipation.     predniSONE  (DELTASONE ) 10 MG tablet Take by mouth.     psyllium (HYDROCIL/METAMUCIL) 95 % PACK Take 1 packet by mouth 3 (three) times daily.     senna-docusate (SENOKOT-S) 8.6-50 MG tablet Take 2 tablets by mouth 2 (two) times daily. 20 tablet 0   sevelamer  carbonate (RENVELA ) 800 MG tablet Take 3,200-4,000 mg by mouth 3 (three) times daily with meals.     topiramate  (TOPAMAX ) 25 MG tablet Take 1 tablet (25 mg total) by mouth 2 (two) times daily.     No current facility-administered medications for this visit.    Allergies  Allergen Reactions   Infed [Iron Dextran] Other (See Comments)    Decreased BP    Vicodin [Hydrocodone -Acetaminophen ] Other (See Comments)    Decreased BP   Hydrocodone     Diclofenac Other (See Comments)    Caused hypotension     REVIEW OF SYSTEMS:  Negative unless noted in HPI [X]  denotes positive finding, [ ]  denotes negative finding Cardiac  Comments:  Chest pain or chest pressure:  Shortness of breath upon exertion:    Short of breath when lying flat:    Irregular heart rhythm:        Vascular    Pain in calf, thigh, or hip brought on by ambulation:    Pain in feet at night that wakes you up from your sleep:     Blood clot in your veins:    Leg swelling:         Pulmonary    Oxygen at home:    Productive cough:     Wheezing:         Neurologic    Sudden weakness in arms or legs:     Sudden numbness in arms or legs:     Sudden onset of difficulty speaking or slurred speech:     Temporary loss of vision in one eye:     Problems with dizziness:         Gastrointestinal    Blood in stool:     Vomited blood:         Genitourinary    Burning when urinating:     Blood in urine:        Psychiatric    Major depression:         Hematologic    Bleeding problems:    Problems with blood clotting too easily:        Skin    Rashes or ulcers:        Constitutional    Fever or chills:      PHYSICAL EXAMINATION:  Vitals:   01/22/24 1409  BP: 109/67  Pulse: 83  Temp: 98.4 F (36.9 C)  TempSrc: Temporal    General:  WDWN in NAD; vital signs documented above Gait: Not observed HENT: WNL, normocephalic Pulmonary: normal non-labored breathing Cardiac: regular HR Abdomen: soft, NT, no masses Skin: without rashes Vascular Exam/Pulses: palpable R PT pulse; L groin without hematoma Extremities: without ischemic changes, without Gangrene , without cellulitis; without open wounds;  Musculoskeletal: no muscle wasting or atrophy  Neurologic: A&O X 3 Psychiatric:  The pt has Normal affect.   Non-Invasive Vascular Imaging:    Right SFA and popliteal widely patent with triphasic waveforms  ABI/TBIToday's ABIToday's TBIPrevious ABIPrevious TBI  +-------+-----------+-----------+------------+------------+  Right N/C 2.60   .68        1.68        .14           +-------+-----------+-----------+------------+------------+  Left  N/C 2.28   1.58       1.2         .47              ASSESSMENT/PLAN:: 63 y.o. male status post angiogram with right SFA and popliteal lithotripsy, drug-coated balloon angioplasty, and stenting  [name] is a 63 year old male endovascular revascularization of the right lower extremity due to critical limb ischemia with rest pain.  Since his procedure he no longer has rest pain.  He has occasional stabbing pain in his right great toe however this is tolerable.  Duplex demonstrates a widely patent right SFA and popliteal  with triphasic waveforms.  Toe pressure has improved drastically compared to preoperatively.  Continue aspirin , Plavix , statin daily.  We will repeat right leg arterial duplex and ABI in 6 months.  He will notify the office if his symptoms return.  Carl Sender, PA-C [facility] [phone]  Clinic MD:   Sheree

## 2024-01-23 ENCOUNTER — Ambulatory Visit: Admitting: Physical Therapy

## 2024-01-23 ENCOUNTER — Encounter: Payer: Self-pay | Admitting: Physical Therapy

## 2024-01-23 ENCOUNTER — Other Ambulatory Visit: Payer: Self-pay | Admitting: *Deleted

## 2024-01-23 DIAGNOSIS — R296 Repeated falls: Secondary | ICD-10-CM

## 2024-01-23 DIAGNOSIS — M79606 Pain in leg, unspecified: Secondary | ICD-10-CM

## 2024-01-23 DIAGNOSIS — R262 Difficulty in walking, not elsewhere classified: Secondary | ICD-10-CM | POA: Diagnosis not present

## 2024-01-23 DIAGNOSIS — R2681 Unsteadiness on feet: Secondary | ICD-10-CM

## 2024-01-23 DIAGNOSIS — M6281 Muscle weakness (generalized): Secondary | ICD-10-CM

## 2024-01-23 DIAGNOSIS — I70421 Atherosclerosis of autologous vein bypass graft(s) of the extremities with rest pain, right leg: Secondary | ICD-10-CM

## 2024-01-23 NOTE — Therapy (Signed)
 OUTPATIENT PHYSICAL THERAPY LOWER EXTREMITY TREATMENT   Patient Name: [name]. MRN: [id number] DOB:[birth year], 63 y.o., male Today's Date: 01/23/2024  END OF SESSION:  PT End of Session - 01/23/24 0808     Visit Number 23    Date for Recertification  02/11/24    PT Start Time 0807    PT Stop Time 0845    PT Time Calculation (min) 38 min    Activity Tolerance Patient tolerated treatment well    Behavior During Therapy [facility] for tasks assessed/performed             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    Parathyroid  abnormality    ectopic parathyroid  gland   Presence of arteriovenous fistula for hemodialysis, primary    RUE PER PT RLE   Refusal of blood product    NO WHOLE BLOOD PROUCTS   Renal cell carcinoma (HCC)    s/p hand assisted laparoscopic bilateral nephrectomies 11/29/17, + RCC left   Secondary hyperparathyroidism    Seizures (HCC)    one episode in past, due to elevated Potassium 08/02/20- at least 4 years ago   Sleep apnea    doesn't use CPAP anymore since weight loss   Stroke [facility])    no residual   Past Surgical History:  Procedure Laterality Date   ABDOMINAL AORTOGRAM W/LOWER EXTREMITY N/A 12/09/2023   Procedure: ABDOMINAL AORTOGRAM W/LOWER EXTREMITY;  Surgeon: Sheree Penne Bruckner, MD;  Location: [facility] INVASIVE CV LAB;  Service: Cardiovascular;  Laterality: N/A;   ANTERIOR CERVICAL CORPECTOMY N/A 05/25/2023    Procedure: ANTERIOR CERVICAL CORPECTOMY CERVICAL FOUR, CERVICAL THREE - CERVICAL FIVE FUSION;  Surgeon: [name] Shove, MD;  Location: [facility] OR;  Service: Neurosurgery;  Laterality: N/A;   AV FISTULA PLACEMENT Right    right arm   BIOPSY  08/01/2019   Procedure: BIOPSY;  Surgeon: Kristie Lamprey, MD;  Location: [facility] ENDOSCOPY;  Service: Endoscopy;;   BIOPSY  11/18/2020   Procedure: BIOPSY;  Surgeon: Stacia Glendia BRAVO, MD;  Location: [facility] ENDOSCOPY;  Service: Gastroenterology;;   BIOPSY  09/13/2021   Procedure: BIOPSY;  Surgeon: Wilhelmenia Aloha Raddle., MD;  Location: [facility] ENDOSCOPY;  Service: Gastroenterology;;   CATARACT EXTRACTION W/ INTRAOCULAR LENS  IMPLANT, BILATERAL     COLON SURGERY     COLONOSCOPY N/A 08/04/2015   Procedure: COLONOSCOPY;  Surgeon: Gordy CHRISTELLA Starch, MD;  Location: [facility] ENDOSCOPY;  Service: Endoscopy;  Laterality: N/A;   COLONOSCOPY  2017   JMP@ [facility]-good prep-mass -recall 1 yr   COLONOSCOPY N/A 09/13/2021   Procedure: COLONOSCOPY;  Surgeon: Mansouraty, Aloha Raddle., MD;  Location: [facility] ENDOSCOPY;  Service: Gastroenterology;  Laterality: N/A;   COLONOSCOPY WITH PROPOFOL  N/A 11/25/2020   Procedure: COLONOSCOPY WITH PROPOFOL ;  Surgeon: Federico Rosario BROCKS, MD;  Location: [facility] ENDOSCOPY;  Service: Gastroenterology;  Laterality: N/A;   COLONOSCOPY WITH PROPOFOL  N/A 09/23/2021   Procedure: COLONOSCOPY WITH PROPOFOL ;  Surgeon: San Sandor GAILS, DO;  Location: [facility] ENDOSCOPY;  Service: Gastroenterology;  Laterality: N/A;   ENTEROSCOPY N/A 09/23/2021   Procedure: ENTEROSCOPY;  Surgeon: San Sandor GAILS, DO;  Location: [facility] ENDOSCOPY;  Service: Gastroenterology;  Laterality: N/A;  Will place order for video capsule study as we may opt to place it during procedure   ESOPHAGOGASTRODUODENOSCOPY N/A 08/01/2019   Procedure: ESOPHAGOGASTRODUODENOSCOPY (EGD);  Surgeon: Kristie Lamprey, MD;  Location: [name] ENDOSCOPY;  Service: Endoscopy;  Laterality: N/A;   ESOPHAGOGASTRODUODENOSCOPY N/A 11/18/2020   Procedure:  ESOPHAGOGASTRODUODENOSCOPY (EGD);  Surgeon: Stacia Glendia BRAVO, MD;  Location: [facility] ENDOSCOPY;  Service: Gastroenterology;  Laterality: N/A;   ESOPHAGOGASTRODUODENOSCOPY (EGD) WITH PROPOFOL  N/A 08/04/2019   Procedure: ESOPHAGOGASTRODUODENOSCOPY (EGD) WITH PROPOFOL ;  Surgeon: Leigh Elspeth SQUIBB, MD;  Location: [facility] ENDOSCOPY;  Service: Gastroenterology;  Laterality: N/A;   ESOPHAGOGASTRODUODENOSCOPY (EGD) WITH PROPOFOL  N/A 09/13/2021   Procedure: ESOPHAGOGASTRODUODENOSCOPY (EGD) WITH PROPOFOL ;  Surgeon: Wilhelmenia Aloha Raddle., MD;  Location: [facility] ENDOSCOPY;  Service: Gastroenterology;  Laterality: N/A;   GIVENS CAPSULE STUDY N/A 09/23/2021   Procedure: GIVENS CAPSULE STUDY;  Surgeon: San Sandor GAILS, DO;  Location: [facility] ENDOSCOPY;  Service: Gastroenterology;  Laterality: N/A;   graft left arm Left    for dialysis x 2. Removed   HOT HEMOSTASIS  11/18/2020   Procedure: HOT HEMOSTASIS (ARGON PLASMA COAGULATION/BICAP);  Surgeon: Stacia Glendia BRAVO, MD;  Location: [facility] ENDOSCOPY;  Service: Gastroenterology;;   HOT HEMOSTASIS N/A 09/23/2021   Procedure: HOT HEMOSTASIS (ARGON PLASMA COAGULATION/BICAP);  Surgeon: San Sandor GAILS, DO;  Location: [facility] ENDOSCOPY;  Service: Gastroenterology;  Laterality: N/A;   INSERTION OF DIALYSIS CATHETER     Rt chest   LAPAROSCOPIC RIGHT COLECTOMY N/A 08/05/2015   Procedure: LAPAROSCOPIC RIGHT COLECTOMY- ASCENDING;  Surgeon: Jina Nephew, MD;  Location: [facility] OR;  Service: General;  Laterality: N/A;   LOWER EXTREMITY ANGIOGRAPHY N/A 12/09/2023   Procedure: Lower Extremity Angiography;  Surgeon: Sheree Penne Bruckner, MD;  Location: [facility] INVASIVE CV LAB;  Service: Cardiovascular;  Laterality: N/A;   LOWER EXTREMITY INTERVENTION N/A 12/09/2023   Procedure: LOWER EXTREMITY INTERVENTION;  Surgeon: Sheree Penne Bruckner, MD;  Location: [facility] INVASIVE CV LAB;  Service: Cardiovascular;  Laterality: N/A;   MASS EXCISION Left 05/28/2019   Procedure: EXCISION SOFT TISSUE MASS LEFT  SHOULDER;  Surgeon: Eletha Boas, MD;  Location: [facility] ORS;  Service: General;  Laterality: Left;   NEPHRECTOMY Bilateral    PARATHYROIDECTOMY N/A 06/12/2016   Procedure: TOTAL PARATHYROIDECTOMY WITH AUTOTRANSPLANTATION TO LEFT FOREARM;  Surgeon: Boas Eletha, MD;  Location: [facility] OR;  Service: General;  Laterality: N/A;   PARATHYROIDECTOMY N/A 10/23/2016   Procedure: PARATHYROIDECTOMY;  Surgeon: Eletha Boas, MD;  Location: [facility] OR;  Service: General;  Laterality: N/A;   REVERSE SHOULDER ARTHROPLASTY Right 08/24/2020   Procedure: REVERSE SHOULDER ARTHROPLASTY;  Surgeon: Cristy Bonner DASEN, MD;  Location: [facility] OR;  Service: Orthopedics;  Laterality: Right;   REVISON OF ARTERIOVENOUS FISTULA Right 07/16/2017   Procedure: REVISION OF ARTERIOVENOUS FISTULA  Right ARM;  Surgeon: Sheree Penne Bruckner, MD;  Location: [facility] OR;  Service: Vascular;  Laterality: Right;   TOTAL HIP ARTHROPLASTY Left 11/14/2020   Procedure: LEFT TOTAL HIP ARTHROPLASTY ANTERIOR APPROACH;  Surgeon: [name] Kay HERO, MD;  Location: [facility] OR;  Service: Orthopedics;  Laterality: Left;   UPPER GASTROINTESTINAL ENDOSCOPY  2021   @ [facility]   Patient Active Problem List   Diagnosis Date Noted  Right knee buckling 11/15/2023   Primary osteoarthritis of right knee 11/15/2023   Abnormality of gait 11/15/2023   Primary osteoarthritis of left knee 10/03/2023   Right hip pain 07/14/2023   Coping style affecting medical condition 06/05/2023   Spinal cord injury, cervical region, sequela 05/30/2023   Cervical vertebral fracture (HCC) 05/25/2023   History of GI bleed 05/25/2023   C4 cervical fracture (HCC) 05/25/2023   Duodenitis    Gastritis and gastroduodenitis    Ischemic colitis    Bright red blood per rectum    ABLA (acute blood loss anemia)    Gastric ulcer    Duodenal ulcer    Diverticulosis of colon without hemorrhage    GI bleed 09/17/2021   Acute upper GI bleeding 09/11/2021   Mucosal abnormality of esophagus    Mucosal abnormality of  stomach    Mucosal abnormality of duodenum    Upper GI bleed    Left hip pain 11/16/2020   Type 2 diabetes mellitus with other specified complication (HCC) 11/14/2020   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/14/2020   Primary osteoarthritis of left hip 10/07/2020   Status post reverse total arthroplasty of right shoulder 08/24/2020   Diverticulitis 03/08/2020   ESRD (end stage renal disease) (HCC) 03/08/2020   Polyneuropathy in diseases classified elsewhere 03/08/2020   Pure hypercholesterolemia 03/08/2020   Sacral back pain 03/08/2020   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 11/10/2019   Acute gastric ulcer with hemorrhage    Acute GI bleeding 07/31/2019   Hypotension of hemodialysis 07/31/2019   Diverticulitis large intestine 07/31/2019   Mass of soft tissue of shoulder 05/26/2019   Awaiting organ transplant status 03/18/2019   Finding of cocaine in blood 02/25/2019   Anaphylactic shock, unspecified, sequela 11/21/2018   Hypercalcemia 10/28/2018   Diarrhea, unspecified 08/08/2018   Hyperkalemia 03/12/2018   Renal cell carcinoma of left kidney (HCC) 12/24/2017   Fluid overload, unspecified 07/06/2017   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   Other specified disorders of kidney and ureter 11/22/2016   Secondary hyperparathyroidism of renal origin 10/23/2016   Headache disorder 08/21/2016   TIA (transient ischemic attack)    Essential hypertension    Seizure (HCC)    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    Lung nodule    Diverticulitis of cecum 07/21/2015   Diverticulitis of large intestine without perforation or abscess without bleeding    Right lower quadrant pain    Obstructive sleep apnea 02/08/2015   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   Diverticulosis 07/13/2014   Lower GI bleed 07/12/2014   Refusal of blood transfusions as patient is Jehovah's Witness    Parotid mass    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 06/13/2014   HLD (hyperlipidemia)    PRES (posterior reversible encephalopathy syndrome)    History of CVA (cerebrovascular accident) 05/23/2014   Anemia of chronic disease 05/23/2014   GERD (gastroesophageal reflux disease) 12/09/2012   Gastrointestinal bleeding 11/12/2012   Melena 11/12/2012   Other psychoactive substance dependence, uncomplicated (HCC) 10/08/2012   Iron deficiency anemia, unspecified 06/10/2012   Moderate protein-calorie malnutrition 07/29/2009   Coagulation defect, unspecified 08/29/2006   Type 2 diabetes mellitus with diabetic peripheral angiopathy without gangrene (HCC) 08/29/2006   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 stage renal disease (HCC) 08/02/2006    PCP: [name] MD   REFERRING PROVIDER: Debby Fidela CROME, NP  REFERRING DIAG:  Diagnosis  S14.109S (ICD-10-CM) -  Spinal cord injury, cervical region, sequela (HCC)    THERAPY DIAG:  Difficulty in walking, not elsewhere classified  Muscle weakness (generalized)  Unsteadiness on feet  Repeated falls  Rationale for Evaluation and Treatment: Rehabilitation  ONSET DATE:  ACDF 05/25/23  SUBJECTIVE:   SUBJECTIVE STATEMENT:   Braces aren't helping, got to get last shot in his knee today   PERTINENT HISTORY: See above   ACDF C3-C5 fusion, anterior cervical corpectomy C3-C4  PAIN:  Are you having pain? 5/10 R knee  PRECAUTIONS:  Precautions:  Precautions Precautions: Fall, Cervical, Other (comment) Recall of Precautions/Restrictions: Impaired Precaution/Restrictions Comments: Verbally reviewed cervical precautions. Required Braces or Orthoses: Cervical Brace Cervical Brace: Soft collar, At all times Restrictions Weight Bearing Restrictions Per Provider Order: No General: Pain: No c/o pain.   RED FLAGS: None   WEIGHT BEARING RESTRICTIONS: No  FALLS:  Has patient fallen in last 6 months? Yes. Number of falls 3- one that led to  surgery, then 2 after getting home from the hospital  LIVING ENVIRONMENT: Lives with: lives with their son Lives in: House/apartment Stairs: ground floor apt  Has following equipment at home: Single point cane, Environmental Consultant - 2 wheeled, and Environmental Consultant - 4 wheeled  OCCUPATION: retired- used to pressure wash   PLOF: Independent, Independent with basic ADLs, Independent with gait, and Independent with transfers  PATIENT GOALS: get back to normal ability, drive convertible again   NEXT MD VISIT: Dr. Cornelio 11/15/23  OBJECTIVE:  Note: Objective measures were completed at Evaluation unless otherwise noted.  DIAGNOSTIC FINDINGS:    CLINICAL DATA:  63 year old male with altered mental status.   EXAM: CT HEAD WITHOUT CONTRAST   TECHNIQUE: Contiguous axial images were obtained from the base of the skull through the vertex without intravenous contrast.   RADIATION DOSE REDUCTION: This exam was performed according to the departmental dose-optimization program which includes automated exposure control, adjustment of the mA and/or kV according to patient size and/or use of iterative reconstruction technique.   COMPARISON:  Brain MRI and head CT 05/25/2023.   FINDINGS: Brain:   Unusual extensive, diffuse pachymeningeal calcification which has been present since at least 2013. Stable cerebral volume. No midline shift, ventriculomegaly, mass effect, evidence of mass lesion, intracranial hemorrhage or evidence of cortically based acute infarction. Small area of chronic encephalomalacia in the medial left occipital lobe redemonstrated. Stable gray-white matter differentiation throughout the brain.   Vascular: Chronic severe pachymeningeal calcification.   Skull: Appears stable.  No acute osseous abnormality identified.   Sinuses/Orbits: Continued left middle ear and mastoid opacification. Other Visualized paranasal sinuses and mastoids are stable and well aerated.   Other: Anterior left  scalp soft tissue injury has regressed since earlier in the month. Orbits soft tissues appears stable.   IMPRESSION: 1. No acute intracranial abnormality. 2. 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 3. Ongoing left middle ear and mastoid opacification. No acute osseous abnormality identified. 4. Regressed scalp soft tissue injury since 05/25/2023.  CLINICAL DATA:  Chronic bilateral knee pain and swelling.   EXAM: RIGHT KNEE - COMPLETE 4+ VIEW; LEFT KNEE - COMPLETE 4+ VIEW   COMPARISON:  Right knee radiographs dated 12/26/2021.   FINDINGS: Right knee:   No acute fracture or dislocation. There is a small to moderate-sized joint effusion. Moderate tricompartmental degenerative changes with joint space narrowing and marginal osteophytosis. Vascular calcifications are noted.   Left knee:   No acute fracture or dislocation. There is a small joint effusion.  Moderate tricompartmental degenerative changes with joint space narrowing and marginal osteophytosis. Vascular calcifications are noted.   IMPRESSION: 1. No acute osseous abnormality. 2. 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  Narrative & Impression  CLINICAL DATA:  Chronic bilateral knee pain and swelling.   EXAM: RIGHT KNEE - COMPLETE 4+ VIEW; LEFT KNEE - COMPLETE 4+ VIEW   COMPARISON:  Right knee radiographs dated 12/26/2021.   FINDINGS: Right knee:   No acute fracture or dislocation. There is a small to moderate-sized joint effusion. Moderate tricompartmental degenerative changes with joint space narrowing and marginal osteophytosis. Vascular calcifications are noted.   Left knee:   No acute fracture or dislocation. There is a small joint effusion. Moderate tricompartmental degenerative changes with joint space narrowing and marginal osteophytosis. Vascular  calcifications are noted.   IMPRESSION: 1. No acute osseous abnormality. 2. 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    Narrative & Impression CLINICAL DATA:  Right hip pain.   EXAM: DG HIP (WITH OR WITHOUT PELVIS) 2-3V RIGHT   COMPARISON:  None Available.   FINDINGS: No acute fracture or dislocation. The bones are osteopenic. Cystic changes of the right femoral head and neck measure up to 3.7 cm. Orthopedic referral is advised. Mild arthritic changes of the right hip. Total left hip arthroplasty. The soft tissues are unremarkable. Vascular calcifications.   IMPRESSION: 1. No acute fracture or dislocation. 2. Cystic changes of the right femoral head and neck. Orthopedic referral is advised.  Lower Venous DVT Study   Patient Name:  [name].  Date of Exam:   06/10/2023  Medical Rec #: [id number]            Accession #:    [id number]  Date of Birth: [birth year]             Patient Gender: M  Patient Age:   12 years  Exam Location:  [facility]  Procedure:      VAS US  LOWER EXTREMITY VENOUS (DVT)  Referring Phys: PRENTICE COMPTON    - PATIENT SURVEYS:  PSFS: THE PATIENT SPECIFIC FUNCTIONAL SCALE  Place score of 0-10 (0 = unable to perform activity and 10 = able to perform activity at the same level as before injury or problem)  Activity Date: eval 09/12/23 11/07/23 11/28/23 01/14/24  Getting in and out of car (anything lower than 20 inches)  0 0  7  2. Balance  3 5  7   3. Walking without a device  2 5  0  4.  Social activities  0 0  5  Total Score 1.25 2.5  4.75    Total Score = Sum of activity scores/number of activities  Minimally Detectable Change: 3 points (for single activity); 2 points (for average score)  [facility] (nd). The Patient Specific Functional Scale . Retrieved from [email or web address]    DGI Moderate impairment: walks 20', slow speed, abnormal gait patterns, evidence for imbalance. (1) Moderate impairment: Makes only minor adjustments to walking speed, or accomplishes a change in speed with significant gait deviations, or changes speed but loses balance but is able to recover and continue walking. (1) Mild impairment: Performs head turns smoothly with slight change in gait velocity, i.e. minor disruption to smooth gait path or uses walking aid. (2) Mild impairment: Performs head turns smoothly with slight change in gait velocity, i.e. minor disruption to smooth gait path or  uses walking aid. (2) Moderate impairment: Turns slowly, requires verbal cueing, requires several small steps to catch balance following turn and stop. (1) Severe impairment: Cannot perform without assistance. (0) Moderate impairment: Is able to clear cones but must significantly slow speed to accomplish task, or requires verbal cueing. (1) Moderate impairment: Two feet to a stair, must use rail. (1) Total Score: 9/24 Scores below 19 suggest that the subject assessed has a higher risk of prospective falls   COGNITION: Overall cognitive status: Within functional limits for tasks assessed     SENSATION: Hx of neuropathy even before fall/surgery     LOWER EXTREMITY MMT:  MMT Right eval Left eval Right 10/24/23 Left 10/24/23 RT/Left 10/31/23 RT/L 11/05/23 RT/L 11/19/23 RT/L 11/28/23 R/L 12/17/23 R/L 12/24/23 Rt 01/02/24 R/L 01/14/24  Hip flexion 3- 3+ 3 3+ 3/3+ 3+/4 4-/4+ 4-/4+ 3+/4 3+/4 3+/4 3+, 4-  Hip extension              Hip abduction            3, 3  Hip adduction              Hip internal rotation              Hip external rotation              Knee flexion 3- 4+ 4- 5 4/5 4/5 5/5  5/5 5/5  4+, 5  Knee extension 3- 5 3- 5 3-/5 3-/5 3/5 3p!/5 3+ *P/4- 4-/4 4/4 4+, 5  Ankle dorsiflexion 4 5 5 5           Ankle plantarflexion              Ankle inversion              Ankle eversion                (Blank rows = not tested)  UPPER EXTREMITY MMT: R/L  Flexion: 3+/5, 3+/5 Extension: 4/5, 4+/5 Add: 4+/5, 4+/5 Abd: 3+/5, 4-/5   GAIT: Distance walked: in clinic distances  Assistive device utilized: Environmental Consultant - 2 wheeled Level of assistance: Modified independence Comments: slow but steady with RW                                                                                                                                TREATMENT DATE:  01/23/24 NuStep L 5 x 6 min LAQ 2lb 2x10 each Standing march 2lb 2x10 in RW Sit to stands from elevated surface 2x10 Bridges 2x15 SLR 2x10 each Sidelying hip abduction Attempted hip Ext  01/21/24 NuStep x7 mins level 5 seat 15 UE position 12 Gait training on TM unilateral walking x2 mins each LE unilateral gait training Bridging 3x10 hooklying with 3 sec hold Education related to home safety and completion of TM walking at home   01/16/24 NuStep L 5 x 5 min Sit to stand x10 elevated mat LUE  pushing on LLE Bridges 2x10 SLR 2x10 each LE on pball bridges, K2C 2x10 Standing shoulder Ext red x15  01/14/24 PT POC reviewed, goals assessed and updated to reflect current status  01/02/24 Nustep L 6 7 min LE only Leg press ( cued to work = pressure BIL) 70# 10 x 3 sets  SL 20lb x10 each, Assist needed w/ RLE Standing march 2lb in RW 2x10 LAQ 2lb cues to for full Fom 2x10 Step up 6 in with RW 10 x CGA with assist for full TKE    12/26/23 Nustep L 6 Leg press ( cued to work = pressure BIL) 70# 10 x 3 sets  SL 20lb x10 each, Assist needed w/ RLE Standing march 2lb in RW 2x10 LAQ 2lb cues to for full Fom 2x10 4in step ups x10 each 2 rails  6in x 10  12/24/23 Nustep L 6 LE only MMT -see chart HS curls 35# 2 sets 10 SL 10 x each 15# Leg press ( cued to work = pressure BIL) 70# 10 x 3 sets LAQ 3# 3 sets 10 cued for full TKE Mini squats with RW and resisted TKE 2 sets 10 6 inch step up 10 x each with UE work on full [employer]  ball core stab ex- seated and standing  12/19/23 Leg press ( cued to work = pressure BIL)60 # 10 , 70# 10 x LAQ 3# 2x10 HS curls green 2x10 STS elevated mat 2 sets 5 with cuing to lean fwd and push thru legs and bend knees to sit vs lean back and brace with back of leg Nustep L 6 LE only 6 inch step up 10 x each with UE work on full [employer] ball core stab ex- seated and standing    12/17/23: Nustep Lvl 5 for 7 min MMTs of the BLE (see above for results) Sit>Stand from elevated surface 2 x 8 Seated Trunk Rotations with Red Medball 2 x 10 each side Modified Sit Up edge of plinth 2 x 10 Seated Marches with green band around feet 2 x 8 each Seated Clamshells with green Tband 2 x 12  11/28/23 Nustep L 5 7 min LE Only S2S form elevated mat with UE 2x10 Leg press 50lb 2x10 LAQ 2lb 2x10 HS curls green 2x10      PATIENT EDUCATION:  Education details: HEP, PT POC, current functional status, activity modifications Person educated: Patient Education method: Explanation Education comprehension: verbalized understanding, returned demonstration, and needs further education  HOME EXERCISE PROGRAM: Educ on getting ankle wts, adjustable up to 5 is rec as we used 3 #- okayed doing seated and standing ex with RW  Access Code: [email or web address] URL: [email or web address] Date: 01/21/2024 Prepared by: Stann Ohara  Exercises - Walking on Treadmill  - 1 x daily - 4 x weekly  ASSESSMENT:  CLINICAL IMPRESSION: dons bilateral knee braces with lateral support from the arthritis clinic. Weakness in bilateral LE. Pt unable to achieve full LE ext with LAQ. Very little ROM with hip abduction due to weakness. Extensor lag with SLR. Pt unable to extend hip when laying prone. Educated on home safety and progression of exercises with his sons help.  Pt stands to benefit from continued skilled physical therapy to address deficit areas and restore safety with activities and participations at  home and in the community.        Patient is a 63 y.o. M who was seen today for physical therapy evaluation and treatment for S14.109S (  ICD-10-CM) - Spinal cord injury, cervical region, sequela (HCC) Pt tolerated a initial progression to therapy well. RLE is significantly weaker than R. Pt had log legs and a relatively short torso making it difficult to stand from low surfaces in conjunction with weak LE. Decrease coordination with side steps. No pain during session. Limited ROM with RLE during isolated exercises.  Will make every effort to improve function and work towards goals.   OBJECTIVE IMPAIRMENTS: Abnormal gait, decreased activity tolerance, decreased balance, decreased coordination, decreased knowledge of use of DME, decreased mobility, difficulty walking, decreased strength, postural dysfunction, and pain.   ACTIVITY LIMITATIONS: standing, squatting, stairs, transfers, bed mobility, toileting, dressing, and locomotion level  PARTICIPATION LIMITATIONS: shopping, community activity, and yard work  PERSONAL FACTORS: Age, Behavior pattern, Education, Fitness, Past/current experiences, Profession, Social background, and Time since onset of injury/illness/exacerbation are also affecting patient's functional outcome.   REHAB POTENTIAL: Good  CLINICAL DECISION MAKING: Evolving/moderate complexity  EVALUATION COMPLEXITY: Moderate   GOALS: Goals reviewed with patient? No  SHORT TERM GOALS: Target date: 10/24/2023   Will be compliant with appropriate progressive HEP  Baseline: Goal status: 10/08/23 MET  2.  Will be able to consistently perform functional STS tranfers from surfaces 20 inches or less  Baseline:  Goal status:10/08/23 on going  10/31/23 progressing still difficult for pt, 11/05/23 progressing still difficult for pt uses his UE, ongoing 11/19/23, Ongoing 11/28/23  Ongoing 12/24/23- still really struggles with this  01/02/24 on going, 01/14/24 inc difficulty without elevated  surface 30+in   3.  Will be able to ambulate at least 314ft with quad cane no more than light MinA  Baseline:  Goal status: 10/08/23 progressing  and 10/31/23  12/24/23 progressing  and 01/02/24, 01/14/24 progressing distance and assist RLE instability limits distance for safety, 235ft SBA  4.  MMT to be at least 3+/5 in all tested groups R LE  Baseline:  Goal status: ongoing 10/24/23  and 10/31/23, Progressing 11/19/23, Progressing 11/28/23  MET 12/24/23    LONG TERM GOALS: Target date: 02/11/24   R LE MMT to be at least 4-/5 in all tested groups  Baseline:  Goal status: ongoing 10/24/23, Ongoing 11/05/23, Ongoing 11/19/23; progressing 12/17/23   01/02/24 progressing, 01/14/24-progressing  2.  Will score at least 18 on DGI with no device to show reduced fall risk  01/14/24: 9/24 Goal status: INITIAL  3.  Will be able to reciprocally ascend/descend steps without difficulty and U rail  Baseline:  Goal status: Ongoing 10/24/23, 11/28/23 and 12/24/23 needs BIL, 01/14/24 inc reliance on BUE HR   4.  Will be able to ambulate community distances with LRAD, Mod(I) to allow for improved community access and participating in social activities  Baseline:  Goal status: Progressing 11/05/23, progressing 01/14/24 with RW  5.  Will be compliant with appropriate gym based exercise program by time of DC  Baseline:  Goal status: reports going to gym some with his son , using bicycle., Progressing 11/05/23 goes with his son; hasn't been recently 12/17/23, 12/2/5, continue to progress towards increased options for workout routine at gym   PLAN:  PT FREQUENCY: 2x/week  PT DURATION: 12 weeks  PLANNED INTERVENTIONS: 97750- Physical Performance Testing, 97110-Therapeutic exercises, 97530- Therapeutic activity, 97112- Neuromuscular re-education, 97535- Self Care, [id number]- Manual therapy, and 97116- Gait training  PLAN FOR NEXT SESSION: progress func strength, gait and balance.  Tanda Sorrow, PTA 01/23/2024  8:09 AM

## 2024-01-28 ENCOUNTER — Encounter: Payer: Self-pay | Admitting: Physical Therapy

## 2024-01-28 ENCOUNTER — Ambulatory Visit: Admitting: Physical Therapy

## 2024-01-28 DIAGNOSIS — M25611 Stiffness of right shoulder, not elsewhere classified: Secondary | ICD-10-CM

## 2024-01-28 DIAGNOSIS — R296 Repeated falls: Secondary | ICD-10-CM

## 2024-01-28 DIAGNOSIS — M6281 Muscle weakness (generalized): Secondary | ICD-10-CM

## 2024-01-28 DIAGNOSIS — G8929 Other chronic pain: Secondary | ICD-10-CM

## 2024-01-28 DIAGNOSIS — R2681 Unsteadiness on feet: Secondary | ICD-10-CM

## 2024-01-28 DIAGNOSIS — R2689 Other abnormalities of gait and mobility: Secondary | ICD-10-CM

## 2024-01-28 DIAGNOSIS — R262 Difficulty in walking, not elsewhere classified: Secondary | ICD-10-CM

## 2024-01-28 NOTE — Therapy (Signed)
 OUTPATIENT PHYSICAL THERAPY LOWER EXTREMITY TREATMENT   Patient Name: Carl Ciampi Sr. MRN: [id number] DOB:[birth year], 63 y.o., male Today's Date: 01/28/2024  END OF SESSION:  PT End of Session - 01/28/24 0804     Visit Number 24    Number of Visits 25    Date for Recertification  02/11/24    Authorization Type [employer] Dual    PT Start Time 0805    PT Stop Time 0845    PT Time Calculation (min) 40 min    Activity Tolerance Patient tolerated treatment well    Behavior During Therapy WFL for tasks assessed/performed              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    Parathyroid  abnormality    ectopic parathyroid  gland   Presence of arteriovenous fistula for hemodialysis, primary    RUE PER PT RLE   Refusal of blood product    NO WHOLE BLOOD PROUCTS   Renal cell carcinoma (HCC)    s/p hand assisted laparoscopic bilateral nephrectomies 11/29/17, + RCC left   Secondary hyperparathyroidism    Seizures (HCC)    one episode in past, due to elevated Potassium 08/02/20- at least 4 years ago   Sleep apnea    doesn't use CPAP anymore since weight loss   Stroke [facility])    no residual   Past Surgical History:  Procedure Laterality Date   ABDOMINAL AORTOGRAM W/LOWER EXTREMITY N/A 12/09/2023   Procedure: ABDOMINAL AORTOGRAM W/LOWER EXTREMITY;  Surgeon: Sheree Penne Bruckner, MD;  Location: [facility] INVASIVE CV LAB;  Service: Cardiovascular;   Laterality: N/A;   ANTERIOR CERVICAL CORPECTOMY N/A 05/25/2023   Procedure: ANTERIOR CERVICAL CORPECTOMY CERVICAL FOUR, CERVICAL THREE - CERVICAL FIVE FUSION;  Surgeon: [name] Shove, MD;  Location: [facility] OR;  Service: Neurosurgery;  Laterality: N/A;   AV FISTULA PLACEMENT Right    right arm   BIOPSY  08/01/2019   Procedure: BIOPSY;  Surgeon: Kristie Lamprey, MD;  Location: [facility] ENDOSCOPY;  Service: Endoscopy;;   BIOPSY  11/18/2020   Procedure: BIOPSY;  Surgeon: Stacia Glendia BRAVO, MD;  Location: [facility] ENDOSCOPY;  Service: Gastroenterology;;   BIOPSY  09/13/2021   Procedure: BIOPSY;  Surgeon: Wilhelmenia Aloha Raddle., MD;  Location: [facility] ENDOSCOPY;  Service: Gastroenterology;;   CATARACT EXTRACTION W/ INTRAOCULAR LENS  IMPLANT, BILATERAL     COLON SURGERY     COLONOSCOPY N/A 08/04/2015   Procedure: COLONOSCOPY;  Surgeon: Gordy CHRISTELLA Starch, MD;  Location: [facility] ENDOSCOPY;  Service: Endoscopy;  Laterality: N/A;   COLONOSCOPY  2017   JMP@ [facility]-good prep-mass -recall 1 yr   COLONOSCOPY N/A 09/13/2021   Procedure: COLONOSCOPY;  Surgeon: Mansouraty, Aloha Raddle., MD;  Location: [facility] ENDOSCOPY;  Service: Gastroenterology;  Laterality: N/A;   COLONOSCOPY WITH PROPOFOL  N/A 11/25/2020  Procedure: COLONOSCOPY WITH PROPOFOL ;  Surgeon: Federico Rosario BROCKS, MD;  Location: [facility] ENDOSCOPY;  Service: Gastroenterology;  Laterality: N/A;   COLONOSCOPY WITH PROPOFOL  N/A 09/23/2021   Procedure: COLONOSCOPY WITH PROPOFOL ;  Surgeon: San Sandor GAILS, DO;  Location: [facility] ENDOSCOPY;  Service: Gastroenterology;  Laterality: N/A;   ENTEROSCOPY N/A 09/23/2021   Procedure: ENTEROSCOPY;  Surgeon: San Sandor GAILS, DO;  Location: [facility] ENDOSCOPY;  Service: Gastroenterology;  Laterality: N/A;  Will place order for video capsule study as we may opt to place it during procedure   ESOPHAGOGASTRODUODENOSCOPY N/A 08/01/2019   Procedure: ESOPHAGOGASTRODUODENOSCOPY (EGD);  Surgeon: Kristie Lamprey, MD;  Location: [facility] ENDOSCOPY;  Service: Endoscopy;  Laterality: N/A;    ESOPHAGOGASTRODUODENOSCOPY N/A 11/18/2020   Procedure: ESOPHAGOGASTRODUODENOSCOPY (EGD);  Surgeon: Stacia Glendia BRAVO, MD;  Location: [facility] ENDOSCOPY;  Service: Gastroenterology;  Laterality: N/A;   ESOPHAGOGASTRODUODENOSCOPY (EGD) WITH PROPOFOL  N/A 08/04/2019   Procedure: ESOPHAGOGASTRODUODENOSCOPY (EGD) WITH PROPOFOL ;  Surgeon: Leigh Elspeth SQUIBB, MD;  Location: [facility] ENDOSCOPY;  Service: Gastroenterology;  Laterality: N/A;   ESOPHAGOGASTRODUODENOSCOPY (EGD) WITH PROPOFOL  N/A 09/13/2021   Procedure: ESOPHAGOGASTRODUODENOSCOPY (EGD) WITH PROPOFOL ;  Surgeon: Wilhelmenia Aloha Raddle., MD;  Location: [facility] ENDOSCOPY;  Service: Gastroenterology;  Laterality: N/A;   GIVENS CAPSULE STUDY N/A 09/23/2021   Procedure: GIVENS CAPSULE STUDY;  Surgeon: San Sandor GAILS, DO;  Location: [facility] ENDOSCOPY;  Service: Gastroenterology;  Laterality: N/A;   graft left arm Left    for dialysis x 2. Removed   HOT HEMOSTASIS  11/18/2020   Procedure: HOT HEMOSTASIS (ARGON PLASMA COAGULATION/BICAP);  Surgeon: Stacia Glendia BRAVO, MD;  Location: [facility] ENDOSCOPY;  Service: Gastroenterology;;   HOT HEMOSTASIS N/A 09/23/2021   Procedure: HOT HEMOSTASIS (ARGON PLASMA COAGULATION/BICAP);  Surgeon: San Sandor GAILS, DO;  Location: [facility] ENDOSCOPY;  Service: Gastroenterology;  Laterality: N/A;   INSERTION OF DIALYSIS CATHETER     Rt chest   LAPAROSCOPIC RIGHT COLECTOMY N/A 08/05/2015   Procedure: LAPAROSCOPIC RIGHT COLECTOMY- ASCENDING;  Surgeon: Jina Nephew, MD;  Location: [facility] OR;  Service: General;  Laterality: N/A;   LOWER EXTREMITY ANGIOGRAPHY N/A 12/09/2023   Procedure: Lower Extremity Angiography;  Surgeon: Sheree Penne Bruckner, MD;  Location: [facility] INVASIVE CV LAB;  Service: Cardiovascular;  Laterality: N/A;   LOWER EXTREMITY INTERVENTION N/A 12/09/2023   Procedure: LOWER EXTREMITY INTERVENTION;  Surgeon: Sheree Penne Bruckner, MD;  Location: [facility] INVASIVE CV LAB;  Service: Cardiovascular;  Laterality: N/A;   MASS EXCISION Left  05/28/2019   Procedure: EXCISION SOFT TISSUE MASS LEFT SHOULDER;  Surgeon: Eletha Boas, MD;  Location: [facility] ORS;  Service: General;  Laterality: Left;   NEPHRECTOMY Bilateral    PARATHYROIDECTOMY N/A 06/12/2016   Procedure: TOTAL PARATHYROIDECTOMY WITH AUTOTRANSPLANTATION TO LEFT FOREARM;  Surgeon: Boas Eletha, MD;  Location: [facility] OR;  Service: General;  Laterality: N/A;   PARATHYROIDECTOMY N/A 10/23/2016   Procedure: PARATHYROIDECTOMY;  Surgeon: Eletha Boas, MD;  Location: [facility] OR;  Service: General;  Laterality: N/A;   REVERSE SHOULDER ARTHROPLASTY Right 08/24/2020   Procedure: REVERSE SHOULDER ARTHROPLASTY;  Surgeon: Cristy Bonner DASEN, MD;  Location: [facility] OR;  Service: Orthopedics;  Laterality: Right;   REVISON OF ARTERIOVENOUS FISTULA Right 07/16/2017   Procedure: REVISION OF ARTERIOVENOUS FISTULA  Right ARM;  Surgeon: Sheree Penne Bruckner, MD;  Location: [facility] OR;  Service: Vascular;  Laterality: Right;   TOTAL HIP ARTHROPLASTY Left 11/14/2020   Procedure: LEFT TOTAL HIP ARTHROPLASTY ANTERIOR APPROACH;  Surgeon: [name] Kay HERO, MD;  Location: [facility] OR;  Service: Orthopedics;  Laterality: Left;   UPPER GASTROINTESTINAL ENDOSCOPY  2021   @  [facility]   Patient Active Problem List   Diagnosis Date Noted   Right knee buckling 11/15/2023   Primary osteoarthritis of right knee 11/15/2023   Abnormality of gait 11/15/2023   Primary osteoarthritis of left knee 10/03/2023   Right hip pain 07/14/2023   Coping style affecting medical condition 06/05/2023   Spinal cord injury, cervical region, sequela 05/30/2023   Cervical vertebral fracture (HCC) 05/25/2023   History of GI bleed 05/25/2023   C4 cervical fracture (HCC) 05/25/2023   Duodenitis    Gastritis and gastroduodenitis    Ischemic colitis    Bright red blood per rectum    ABLA (acute blood loss anemia)    Gastric ulcer    Duodenal ulcer    Diverticulosis of colon without hemorrhage    GI bleed 09/17/2021   Acute upper GI bleeding 09/11/2021    Mucosal abnormality of esophagus    Mucosal abnormality of stomach    Mucosal abnormality of duodenum    Upper GI bleed    Left hip pain 11/16/2020   Type 2 diabetes mellitus with other specified complication (HCC) 11/14/2020   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/14/2020   Primary osteoarthritis of left hip 10/07/2020   Status post reverse total arthroplasty of right shoulder 08/24/2020   Diverticulitis 03/08/2020   ESRD (end stage renal disease) (HCC) 03/08/2020   Polyneuropathy in diseases classified elsewhere 03/08/2020   Pure hypercholesterolemia 03/08/2020   Sacral back pain 03/08/2020   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 11/10/2019   Acute gastric ulcer with hemorrhage    Acute GI bleeding 07/31/2019   Hypotension of hemodialysis 07/31/2019   Diverticulitis large intestine 07/31/2019   Mass of soft tissue of shoulder 05/26/2019   Awaiting organ transplant status 03/18/2019   Finding of cocaine in blood 02/25/2019   Anaphylactic shock, unspecified, sequela 11/21/2018   Hypercalcemia 10/28/2018   Diarrhea, unspecified 08/08/2018   Hyperkalemia 03/12/2018   Renal cell carcinoma of left kidney (HCC) 12/24/2017   Fluid overload, unspecified 07/06/2017   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   Other specified disorders of kidney and ureter 11/22/2016   Secondary hyperparathyroidism of renal origin 10/23/2016   Headache disorder 08/21/2016   TIA (transient ischemic attack)    Essential hypertension    Seizure (HCC)    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    Lung nodule    Diverticulitis of cecum 07/21/2015   Diverticulitis of large intestine without perforation or abscess without bleeding    Right lower quadrant pain    Obstructive sleep apnea 02/08/2015   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   Diverticulosis 07/13/2014   Lower GI bleed 07/12/2014   Refusal of blood transfusions as patient is Jehovah's Witness    Parotid mass     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   HLD (hyperlipidemia)    PRES (posterior reversible encephalopathy syndrome)    History of CVA (cerebrovascular accident) 05/23/2014   Anemia of chronic disease 05/23/2014   GERD (gastroesophageal reflux disease) 12/09/2012   Gastrointestinal bleeding 11/12/2012   Melena 11/12/2012   Other psychoactive substance dependence, uncomplicated (HCC) 10/08/2012   Iron deficiency anemia, unspecified 06/10/2012   Moderate protein-calorie malnutrition 07/29/2009   Coagulation defect, unspecified 08/29/2006   Type 2 diabetes mellitus with diabetic peripheral angiopathy without gangrene (HCC) 08/29/2006   Hypertensive chronic kidney disease with stage 5 chronic kidney disease or end stage renal disease (HCC) 08/02/2006    PCP: [name] MD   REFERRING  PROVIDER: Debby Fidela CROME, NP  REFERRING DIAG:  Diagnosis  S14.109S (ICD-10-CM) - Spinal cord injury, cervical region, sequela (HCC)    THERAPY DIAG:  Difficulty in walking, not elsewhere classified  Muscle weakness (generalized)  Unsteadiness on feet  Repeated falls  Stiffness of right shoulder, not elsewhere classified  Chronic right shoulder pain  Chronic pain of right knee  Other abnormalities of gait and mobility  Rationale for Evaluation and Treatment: Rehabilitation  ONSET DATE:  ACDF 05/25/23  SUBJECTIVE:   SUBJECTIVE STATEMENT:   Pt states that he is thinking about surgery for his knees, had final injection of series last week.    PERTINENT HISTORY: See above   ACDF C3-C5 fusion, anterior cervical corpectomy C3-C4  PAIN:  Are you having pain? 5/10 R knee  PRECAUTIONS:  Precautions:  Precautions Precautions: Fall, Cervical, Other (comment) Recall of Precautions/Restrictions: Impaired Precaution/Restrictions Comments: Verbally reviewed cervical precautions. Required Braces or Orthoses: Cervical Brace Cervical Brace: Soft collar, At  all times Restrictions Weight Bearing Restrictions Per Provider Order: No General: Pain: No c/o pain.   RED FLAGS: None   WEIGHT BEARING RESTRICTIONS: No  FALLS:  Has patient fallen in last 6 months? Yes. Number of falls 3- one that led to surgery, then 2 after getting home from the hospital  LIVING ENVIRONMENT: Lives with: lives with their son Lives in: House/apartment Stairs: ground floor apt  Has following equipment at home: Single point cane, Environmental Consultant - 2 wheeled, and Environmental Consultant - 4 wheeled  OCCUPATION: retired- used to pressure wash   PLOF: Independent, Independent with basic ADLs, Independent with gait, and Independent with transfers  PATIENT GOALS: get back to normal ability, drive convertible again   NEXT MD VISIT: Dr. Cornelio 11/15/23  OBJECTIVE:  Note: Objective measures were completed at Evaluation unless otherwise noted.  DIAGNOSTIC FINDINGS:    CLINICAL DATA:  63 year old male with altered mental status.   EXAM: CT HEAD WITHOUT CONTRAST   TECHNIQUE: Contiguous axial images were obtained from the base of the skull through the vertex without intravenous contrast.   RADIATION DOSE REDUCTION: This exam was performed according to the departmental dose-optimization program which includes automated exposure control, adjustment of the mA and/or kV according to patient size and/or use of iterative reconstruction technique.   COMPARISON:  Brain MRI and head CT 05/25/2023.   FINDINGS: Brain:   Unusual extensive, diffuse pachymeningeal calcification which has been present since at least 2013. Stable cerebral volume. No midline shift, ventriculomegaly, mass effect, evidence of mass lesion, intracranial hemorrhage or evidence of cortically based acute infarction. Small area of chronic encephalomalacia in the medial left occipital lobe redemonstrated. Stable gray-white matter differentiation throughout the brain.   Vascular: Chronic severe pachymeningeal  calcification.   Skull: Appears stable.  No acute osseous abnormality identified.   Sinuses/Orbits: Continued left middle ear and mastoid opacification. Other Visualized paranasal sinuses and mastoids are stable and well aerated.   Other: Anterior left scalp soft tissue injury has regressed since earlier in the month. Orbits soft tissues appears stable.   IMPRESSION: 1. No acute intracranial abnormality. 2. 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 3. Ongoing left middle ear and mastoid opacification. No acute osseous abnormality identified. 4. Regressed scalp soft tissue injury since 05/25/2023.  CLINICAL DATA:  Chronic bilateral knee pain and swelling.   EXAM: RIGHT KNEE - COMPLETE 4+ VIEW; LEFT KNEE - COMPLETE 4+ VIEW   COMPARISON:  Right knee radiographs dated 12/26/2021.   FINDINGS: Right knee:  No acute fracture or dislocation. There is a small to moderate-sized joint effusion. Moderate tricompartmental degenerative changes with joint space narrowing and marginal osteophytosis. Vascular calcifications are noted.   Left knee:   No acute fracture or dislocation. There is a small joint effusion. Moderate tricompartmental degenerative changes with joint space narrowing and marginal osteophytosis. Vascular calcifications are noted.   IMPRESSION: 1. No acute osseous abnormality. 2. 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  Narrative & Impression  CLINICAL DATA:  Chronic bilateral knee pain and swelling.   EXAM: RIGHT KNEE - COMPLETE 4+ VIEW; LEFT KNEE - COMPLETE 4+ VIEW   COMPARISON:  Right knee radiographs dated 12/26/2021.   FINDINGS: Right knee:   No acute fracture or dislocation. There is a small to moderate-sized joint effusion. Moderate tricompartmental degenerative changes with joint space narrowing and  marginal osteophytosis. Vascular calcifications are noted.   Left knee:   No acute fracture or dislocation. There is a small joint effusion. Moderate tricompartmental degenerative changes with joint space narrowing and marginal osteophytosis. Vascular calcifications are noted.   IMPRESSION: 1. No acute osseous abnormality. 2. 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    Narrative & Impression CLINICAL DATA:  Right hip pain.   EXAM: DG HIP (WITH OR WITHOUT PELVIS) 2-3V RIGHT   COMPARISON:  None Available.   FINDINGS: No acute fracture or dislocation. The bones are osteopenic. Cystic changes of the right femoral head and neck measure up to 3.7 cm. Orthopedic referral is advised. Mild arthritic changes of the right hip. Total left hip arthroplasty. The soft tissues are unremarkable. Vascular calcifications.   IMPRESSION: 1. No acute fracture or dislocation. 2. Cystic changes of the right femoral head and neck. Orthopedic referral is advised.  Lower Venous DVT Study   Patient Name:  Carl Pillsbury Sr.  Date of Exam:   06/10/2023  Medical Rec #: [id number]            Accession #:    [id number]  Date of Birth: 03/15/1960             Patient Gender: M  Patient Age:   58 years  Exam Location:  [facility]  Procedure:      VAS US  LOWER EXTREMITY VENOUS (DVT)  Referring Phys: PRENTICE COMPTON    - PATIENT SURVEYS:  PSFS: THE PATIENT SPECIFIC FUNCTIONAL SCALE  Place score of 0-10 (0 = unable to perform activity and 10 = able to perform activity at the same level as before injury or problem)  Activity Date: eval 09/12/23 11/07/23 11/28/23 01/14/24  Getting in and out of car (anything lower than 20 inches)  0 0  7  2. Balance  3 5  7   3. Walking without a device  2 5  0  4.  Social activities  0 0  5  Total Score 1.25 2.5  4.75    Total Score = Sum of activity scores/number of  activities  Minimally Detectable Change: 3 points (for single activity); 2 points (for average score)  [facility] (nd). The Patient Specific Functional Scale . Retrieved from [email or web address]   DGI Moderate impairment: walks 20', slow speed, abnormal gait patterns, evidence for imbalance. (1) Moderate impairment: Makes only minor adjustments to walking speed, or accomplishes a change in speed with significant gait deviations, or changes speed but loses balance but is able to recover and continue walking. (  1) Mild impairment: Performs head turns smoothly with slight change in gait velocity, i.e. minor disruption to smooth gait path or uses walking aid. (2) Mild impairment: Performs head turns smoothly with slight change in gait velocity, i.e. minor disruption to smooth gait path or uses walking aid. (2) Moderate impairment: Turns slowly, requires verbal cueing, requires several small steps to catch balance following turn and stop. (1) Severe impairment: Cannot perform without assistance. (0) Moderate impairment: Is able to clear cones but must significantly slow speed to accomplish task, or requires verbal cueing. (1) Moderate impairment: Two feet to a stair, must use rail. (1) Total Score: 9/24 Scores below 19 suggest that the subject assessed has a higher risk of prospective falls   COGNITION: Overall cognitive status: Within functional limits for tasks assessed     SENSATION: Hx of neuropathy even before fall/surgery     LOWER EXTREMITY MMT:  MMT Right eval Left eval Right 10/24/23 Left 10/24/23 RT/Left 10/31/23 RT/L 11/05/23 RT/L 11/19/23 RT/L 11/28/23 R/L 12/17/23 R/L 12/24/23 Rt 01/02/24 R/L 01/14/24  Hip flexion 3- 3+ 3 3+ 3/3+ 3+/4 4-/4+ 4-/4+ 3+/4 3+/4 3+/4 3+, 4-  Hip extension              Hip abduction            3, 3  Hip adduction              Hip internal rotation              Hip external rotation               Knee flexion 3- 4+ 4- 5 4/5 4/5 5/5  5/5 5/5  4+, 5  Knee extension 3- 5 3- 5 3-/5 3-/5 3/5 3p!/5 3+ *P/4- 4-/4 4/4 4+, 5  Ankle dorsiflexion 4 5 5 5           Ankle plantarflexion              Ankle inversion              Ankle eversion               (Blank rows = not tested)  UPPER EXTREMITY MMT: R/L  Flexion: 3+/5, 3+/5 Extension: 4/5, 4+/5 Add: 4+/5, 4+/5 Abd: 3+/5, 4-/5   GAIT: Distance walked: in clinic distances  Assistive device utilized: Environmental Consultant - 2 wheeled Level of assistance: Modified independence Comments: slow but steady with RW                                                                                                                                TREATMENT DATE:  01/28/24 NuStep x6 mins level 5 seat 15 UE 12 Hamstring isometrics in short sit with physioball 10x10s  Straight leg bridge 2x10 in supine with physioball  Seated PNF D1 LE  3x10 BLE Seated calf stretch x30 BLE with strap Seated physioball press x25, 3 sec hold  01/23/24 NuStep L 5 x 6 min LAQ 2lb 2x10 each Standing march 2lb 2x10 in RW Sit to stands from elevated surface 2x10 Bridges 2x15 SLR 2x10 each Sidelying hip abduction Attempted hip Ext  01/21/24 NuStep x7 mins level 5 seat 15 UE position 12 Gait training on TM unilateral walking x2 mins each LE unilateral gait training Bridging 3x10 hooklying with 3 sec hold Education related to home safety and completion of TM walking at home   01/16/24 NuStep L 5 x 5 min Sit to stand x10 elevated mat LUE pushing on LLE Bridges 2x10 SLR 2x10 each LE on pball bridges, K2C 2x10 Standing shoulder Ext red x15  01/14/24 PT POC reviewed, goals assessed and updated to reflect current status  01/02/24 Nustep L 6 7 min LE only Leg press ( cued to work = pressure BIL) 70# 10 x 3 sets  SL 20lb x10 each, Assist needed w/ RLE Standing march 2lb in RW 2x10 LAQ 2lb cues to for full Fom 2x10 Step up 6 in with RW 10 x CGA with assist for  full TKE    12/26/23 Nustep L 6 Leg press ( cued to work = pressure BIL) 70# 10 x 3 sets  SL 20lb x10 each, Assist needed w/ RLE Standing march 2lb in RW 2x10 LAQ 2lb cues to for full Fom 2x10 4in step ups x10 each 2 rails  6in x 10  12/24/23 Nustep L 6 LE only MMT -see chart HS curls 35# 2 sets 10 SL 10 x each 15# Leg press ( cued to work = pressure BIL) 70# 10 x 3 sets LAQ 3# 3 sets 10 cued for full TKE Mini squats with RW and resisted TKE 2 sets 10 6 inch step up 10 x each with UE work on full [employer] ball core stab ex- seated and standing  12/19/23 Leg press ( cued to work = pressure BIL)60 # 10 , 70# 10 x LAQ 3# 2x10 HS curls green 2x10 STS elevated mat 2 sets 5 with cuing to lean fwd and push thru legs and bend knees to sit vs lean back and brace with back of leg Nustep L 6 LE only 6 inch step up 10 x each with UE work on full [employer] ball core stab ex- seated and standing    12/17/23: Nustep Lvl 5 for 7 min MMTs of the BLE (see above for results) Sit>Stand from elevated surface 2 x 8 Seated Trunk Rotations with Red Medball 2 x 10 each side Modified Sit Up edge of plinth 2 x 10 Seated Marches with green band around feet 2 x 8 each Seated Clamshells with green Tband 2 x 12  11/28/23 Nustep L 5 7 min LE Only S2S form elevated mat with UE 2x10 Leg press 50lb 2x10 LAQ 2lb 2x10 HS curls green 2x10      PATIENT EDUCATION:  Education details: HEP, PT POC, current functional status, activity modifications Person educated: Patient Education method: Explanation Education comprehension: verbalized understanding, returned demonstration, and needs further education  HOME EXERCISE PROGRAM: Educ on getting ankle wts, adjustable up to 5 is rec as we used 3 #- okayed doing seated and standing ex with RW  Access Code: [email or web address] URL: [email or web address] Date: 01/21/2024 Prepared by: Stann Ohara  Exercises - Walking on Treadmill  - 1  x daily - 4 x weekly  ASSESSMENT:  CLINICAL IMPRESSION:  Pt has been responding well to her current regimen and was able to  progress motor control, soft tissue elongation, and strengthening through functional movement patterns. Discussed surgery and function related to outcomes with pt.   dons bilateral knee braces with lateral support from the arthritis clinic. Weakness in bilateral LE. Pt unable to achieve full LE ext with LAQ. Very little ROM with hip abduction due to weakness. Extensor lag with SLR. Pt unable to extend hip when laying prone. Educated on home safety and progression of exercises with his sons help.  Pt stands to benefit from continued skilled physical therapy to address deficit areas and restore safety with activities and participations at home and in the community.        Patient is a 63 y.o. M who was seen today for physical therapy evaluation and treatment for S14.109S (ICD-10-CM) - Spinal cord injury, cervical region, sequela (HCC) Pt tolerated a initial progression to therapy well. RLE is significantly weaker than R. Pt had log legs and a relatively short torso making it difficult to stand from low surfaces in conjunction with weak LE. Decrease coordination with side steps. No pain during session. Limited ROM with RLE during isolated exercises.  Will make every effort to improve function and work towards goals.   OBJECTIVE IMPAIRMENTS: Abnormal gait, decreased activity tolerance, decreased balance, decreased coordination, decreased knowledge of use of DME, decreased mobility, difficulty walking, decreased strength, postural dysfunction, and pain.   ACTIVITY LIMITATIONS: standing, squatting, stairs, transfers, bed mobility, toileting, dressing, and locomotion level  PARTICIPATION LIMITATIONS: shopping, community activity, and yard work  PERSONAL FACTORS: Age, Behavior pattern, Education, Fitness, Past/current experiences, Profession, Social background, and Time since  onset of injury/illness/exacerbation are also affecting patient's functional outcome.   REHAB POTENTIAL: Good  CLINICAL DECISION MAKING: Evolving/moderate complexity  EVALUATION COMPLEXITY: Moderate   GOALS: Goals reviewed with patient? No  SHORT TERM GOALS: Target date: 10/24/2023   Will be compliant with appropriate progressive HEP  Baseline: Goal status: 10/08/23 MET  2.  Will be able to consistently perform functional STS tranfers from surfaces 20 inches or less  Baseline:  Goal status:10/08/23 on going  10/31/23 progressing still difficult for pt, 11/05/23 progressing still difficult for pt uses his UE, ongoing 11/19/23, Ongoing 11/28/23  Ongoing 12/24/23- still really struggles with this  01/02/24 on going, 01/14/24 inc difficulty without elevated surface 30+in   3.  Will be able to ambulate at least 362ft with quad cane no more than light MinA  Baseline:  Goal status: 10/08/23 progressing  and 10/31/23  12/24/23 progressing  and 01/02/24, 01/14/24 progressing distance and assist RLE instability limits distance for safety, 269ft SBA  4.  MMT to be at least 3+/5 in all tested groups R LE  Baseline:  Goal status: ongoing 10/24/23  and 10/31/23, Progressing 11/19/23, Progressing 11/28/23  MET 12/24/23    LONG TERM GOALS: Target date: 02/11/24   R LE MMT to be at least 4-/5 in all tested groups  Baseline:  Goal status: ongoing 10/24/23, Ongoing 11/05/23, Ongoing 11/19/23; progressing 12/17/23   01/02/24 progressing, 01/14/24-progressing  2.  Will score at least 18 on DGI with no device to show reduced fall risk  01/14/24: 9/24 Goal status: INITIAL  3.  Will be able to reciprocally ascend/descend steps without difficulty and U rail  Baseline:  Goal status: Ongoing 10/24/23, 11/28/23 and 12/24/23 needs BIL, 01/14/24 inc reliance on BUE HR   4.  Will be able to ambulate community distances with LRAD, Mod(I) to allow for improved community access and participating in social activities   Baseline:  Goal status: Progressing 11/05/23, progressing 01/14/24 with RW  5.  Will be compliant with appropriate gym based exercise program by time of DC  Baseline:  Goal status: reports going to gym some with his son , using bicycle., Progressing 11/05/23 goes with his son; hasn't been recently 12/17/23, 12/2/5, continue to progress towards increased options for workout routine at gym   PLAN:  PT FREQUENCY: 2x/week  PT DURATION: 12 weeks  PLANNED INTERVENTIONS: 97750- Physical Performance Testing, 97110-Therapeutic exercises, 97530- Therapeutic activity, 97112- Neuromuscular re-education, 97535- Self Care, [id number]- Manual therapy, and [id number]- Gait training  PLAN FOR NEXT SESSION: progress func strength, gait and balance.  Tanda Sorrow, PTA 01/28/2024 8:44 AM

## 2024-01-30 ENCOUNTER — Ambulatory Visit: Admitting: Physical Therapy

## 2024-02-05 ENCOUNTER — Ambulatory Visit: Admitting: Physical Therapy

## 2024-02-05 ENCOUNTER — Encounter: Payer: Self-pay | Admitting: Physical Therapy

## 2024-02-05 DIAGNOSIS — R296 Repeated falls: Secondary | ICD-10-CM

## 2024-02-05 DIAGNOSIS — R2681 Unsteadiness on feet: Secondary | ICD-10-CM

## 2024-02-05 DIAGNOSIS — R262 Difficulty in walking, not elsewhere classified: Secondary | ICD-10-CM | POA: Diagnosis not present

## 2024-02-05 DIAGNOSIS — M6281 Muscle weakness (generalized): Secondary | ICD-10-CM

## 2024-02-05 NOTE — Therapy (Signed)
 " OUTPATIENT PHYSICAL THERAPY LOWER EXTREMITY TREATMENT   Patient Name: Carl Tallman Sr. MRN: [id number] DOB:1960/04/08, 63 y.o., male Today's Date: 02/05/2024  END OF SESSION:  PT End of Session - 02/05/24 0859     Visit Number 25    Date for Recertification  02/11/24    PT Start Time 0850    PT Stop Time 0930    PT Time Calculation (min) 40 min    Activity Tolerance Patient tolerated treatment well    Behavior During Therapy [facility] for tasks assessed/performed              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    Parathyroid  abnormality    ectopic parathyroid  gland   Presence of arteriovenous fistula for hemodialysis, primary    RUE PER PT RLE   Refusal of blood product    NO WHOLE BLOOD PROUCTS   Renal cell carcinoma (HCC)    s/p hand assisted laparoscopic bilateral nephrectomies 11/29/17, + RCC left   Secondary hyperparathyroidism    Seizures (HCC)    one episode in past, due to elevated Potassium 08/02/20- at least 4 years ago   Sleep apnea    doesn't use CPAP anymore since weight loss   Stroke [facility])    no residual   Past Surgical History:  Procedure Laterality Date   ABDOMINAL AORTOGRAM W/LOWER EXTREMITY N/A 12/09/2023   Procedure: ABDOMINAL AORTOGRAM W/LOWER EXTREMITY;  Surgeon: Sheree Penne Bruckner, MD;  Location: [facility] INVASIVE CV LAB;  Service: Cardiovascular;  Laterality: N/A;   ANTERIOR CERVICAL CORPECTOMY N/A 05/25/2023    Procedure: ANTERIOR CERVICAL CORPECTOMY CERVICAL FOUR, CERVICAL THREE - CERVICAL FIVE FUSION;  Surgeon: [name] Shove, MD;  Location: [facility] OR;  Service: Neurosurgery;  Laterality: N/A;   AV FISTULA PLACEMENT Right    right arm   BIOPSY  08/01/2019   Procedure: BIOPSY;  Surgeon: Kristie Lamprey, MD;  Location: [facility] ENDOSCOPY;  Service: Endoscopy;;   BIOPSY  11/18/2020   Procedure: BIOPSY;  Surgeon: Stacia Glendia BRAVO, MD;  Location: [facility] ENDOSCOPY;  Service: Gastroenterology;;   BIOPSY  09/13/2021   Procedure: BIOPSY;  Surgeon: Wilhelmenia Aloha Raddle., MD;  Location: [facility] ENDOSCOPY;  Service: Gastroenterology;;   CATARACT EXTRACTION W/ INTRAOCULAR LENS  IMPLANT, BILATERAL     COLON SURGERY     COLONOSCOPY N/A 08/04/2015   Procedure: COLONOSCOPY;  Surgeon: Gordy CHRISTELLA Starch, MD;  Location: [facility] ENDOSCOPY;  Service: Endoscopy;  Laterality: N/A;   COLONOSCOPY  2017   JMP@ [facility]-good prep-mass -recall 1 yr   COLONOSCOPY N/A 09/13/2021   Procedure: COLONOSCOPY;  Surgeon: Mansouraty, Aloha Raddle., MD;  Location: [facility] ENDOSCOPY;  Service: Gastroenterology;  Laterality: N/A;   COLONOSCOPY WITH PROPOFOL  N/A 11/25/2020   Procedure: COLONOSCOPY WITH PROPOFOL ;  Surgeon: Federico Rosario BROCKS, MD;  Location:  [facility] ENDOSCOPY;  Service: Gastroenterology;  Laterality: N/A;   COLONOSCOPY WITH PROPOFOL  N/A 09/23/2021   Procedure: COLONOSCOPY WITH PROPOFOL ;  Surgeon: San Sandor GAILS, DO;  Location: [facility] ENDOSCOPY;  Service: Gastroenterology;  Laterality: N/A;   ENTEROSCOPY N/A 09/23/2021   Procedure: ENTEROSCOPY;  Surgeon: San Sandor GAILS, DO;  Location: [facility] ENDOSCOPY;  Service: Gastroenterology;  Laterality: N/A;  Will place order for video capsule study as we may opt to place it during procedure   ESOPHAGOGASTRODUODENOSCOPY N/A 08/01/2019   Procedure: ESOPHAGOGASTRODUODENOSCOPY (EGD);  Surgeon: Kristie Lamprey, MD;  Location: [facility] ENDOSCOPY;  Service: Endoscopy;  Laterality: N/A;   ESOPHAGOGASTRODUODENOSCOPY N/A 11/18/2020   Procedure:  ESOPHAGOGASTRODUODENOSCOPY (EGD);  Surgeon: Stacia Glendia BRAVO, MD;  Location: [facility] ENDOSCOPY;  Service: Gastroenterology;  Laterality: N/A;   ESOPHAGOGASTRODUODENOSCOPY (EGD) WITH PROPOFOL  N/A 08/04/2019   Procedure: ESOPHAGOGASTRODUODENOSCOPY (EGD) WITH PROPOFOL ;  Surgeon: Leigh Elspeth SQUIBB, MD;  Location: [facility] ENDOSCOPY;  Service: Gastroenterology;  Laterality: N/A;   ESOPHAGOGASTRODUODENOSCOPY (EGD) WITH PROPOFOL  N/A 09/13/2021   Procedure: ESOPHAGOGASTRODUODENOSCOPY (EGD) WITH PROPOFOL ;  Surgeon: Wilhelmenia Aloha Raddle., MD;  Location: [facility] ENDOSCOPY;  Service: Gastroenterology;  Laterality: N/A;   GIVENS CAPSULE STUDY N/A 09/23/2021   Procedure: GIVENS CAPSULE STUDY;  Surgeon: San Sandor GAILS, DO;  Location: [facility] ENDOSCOPY;  Service: Gastroenterology;  Laterality: N/A;   graft left arm Left    for dialysis x 2. Removed   HOT HEMOSTASIS  11/18/2020   Procedure: HOT HEMOSTASIS (ARGON PLASMA COAGULATION/BICAP);  Surgeon: Stacia Glendia BRAVO, MD;  Location: [facility] ENDOSCOPY;  Service: Gastroenterology;;   HOT HEMOSTASIS N/A 09/23/2021   Procedure: HOT HEMOSTASIS (ARGON PLASMA COAGULATION/BICAP);  Surgeon: San Sandor GAILS, DO;  Location: [facility] ENDOSCOPY;  Service: Gastroenterology;  Laterality: N/A;   INSERTION OF DIALYSIS CATHETER     Rt chest   LAPAROSCOPIC RIGHT COLECTOMY N/A 08/05/2015   Procedure: LAPAROSCOPIC RIGHT COLECTOMY- ASCENDING;  Surgeon: Jina Nephew, MD;  Location: [facility] OR;  Service: General;  Laterality: N/A;   LOWER EXTREMITY ANGIOGRAPHY N/A 12/09/2023   Procedure: Lower Extremity Angiography;  Surgeon: Sheree Penne Bruckner, MD;  Location: [facility] INVASIVE CV LAB;  Service: Cardiovascular;  Laterality: N/A;   LOWER EXTREMITY INTERVENTION N/A 12/09/2023   Procedure: LOWER EXTREMITY INTERVENTION;  Surgeon: Sheree Penne Bruckner, MD;  Location: [facility] INVASIVE CV LAB;  Service: Cardiovascular;  Laterality: N/A;   MASS EXCISION Left 05/28/2019   Procedure: EXCISION SOFT TISSUE MASS LEFT  SHOULDER;  Surgeon: Eletha Boas, MD;  Location: [facility] ORS;  Service: General;  Laterality: Left;   NEPHRECTOMY Bilateral    PARATHYROIDECTOMY N/A 06/12/2016   Procedure: TOTAL PARATHYROIDECTOMY WITH AUTOTRANSPLANTATION TO LEFT FOREARM;  Surgeon: Boas Eletha, MD;  Location: [facility] OR;  Service: General;  Laterality: N/A;   PARATHYROIDECTOMY N/A 10/23/2016   Procedure: PARATHYROIDECTOMY;  Surgeon: Eletha Boas, MD;  Location: [facility] OR;  Service: General;  Laterality: N/A;   REVERSE SHOULDER ARTHROPLASTY Right 08/24/2020   Procedure: REVERSE SHOULDER ARTHROPLASTY;  Surgeon: Cristy Bonner DASEN, MD;  Location: [facility] OR;  Service: Orthopedics;  Laterality: Right;   REVISON OF ARTERIOVENOUS FISTULA Right 07/16/2017   Procedure: REVISION OF ARTERIOVENOUS FISTULA  Right ARM;  Surgeon: Sheree Penne Bruckner, MD;  Location: [facility] OR;  Service: Vascular;  Laterality: Right;   TOTAL HIP ARTHROPLASTY Left 11/14/2020   Procedure: LEFT TOTAL HIP ARTHROPLASTY ANTERIOR APPROACH;  Surgeon: [name] Kay HERO, MD;  Location: [facility] OR;  Service: Orthopedics;  Laterality: Left;   UPPER GASTROINTESTINAL ENDOSCOPY  2021   @ [facility]   Patient Active Problem List  Diagnosis Date Noted   Right knee buckling 11/15/2023   Primary osteoarthritis of right knee 11/15/2023   Abnormality of gait 11/15/2023   Primary osteoarthritis of left knee 10/03/2023   Right hip pain 07/14/2023   Coping style affecting medical condition 06/05/2023   Spinal cord injury, cervical region, sequela 05/30/2023   Cervical vertebral fracture (HCC) 05/25/2023   History of GI bleed 05/25/2023   C4 cervical fracture (HCC) 05/25/2023   Duodenitis    Gastritis and gastroduodenitis    Ischemic colitis    Bright red blood per rectum    ABLA (acute blood loss anemia)    Gastric ulcer    Duodenal ulcer    Diverticulosis of colon without hemorrhage    GI bleed 09/17/2021   Acute upper GI bleeding 09/11/2021   Mucosal abnormality of esophagus    Mucosal abnormality of  stomach    Mucosal abnormality of duodenum    Upper GI bleed    Left hip pain 11/16/2020   Type 2 diabetes mellitus with other specified complication (HCC) 11/14/2020   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/14/2020   Primary osteoarthritis of left hip 10/07/2020   Status post reverse total arthroplasty of right shoulder 08/24/2020   Diverticulitis 03/08/2020   ESRD (end stage renal disease) (HCC) 03/08/2020   Polyneuropathy in diseases classified elsewhere 03/08/2020   Pure hypercholesterolemia 03/08/2020   Sacral back pain 03/08/2020   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 11/10/2019   Acute gastric ulcer with hemorrhage    Acute GI bleeding 07/31/2019   Hypotension of hemodialysis 07/31/2019   Diverticulitis large intestine 07/31/2019   Mass of soft tissue of shoulder 05/26/2019   Awaiting organ transplant status 03/18/2019   Finding of cocaine in blood 02/25/2019   Anaphylactic shock, unspecified, sequela 11/21/2018   Hypercalcemia 10/28/2018   Diarrhea, unspecified 08/08/2018   Hyperkalemia 03/12/2018   Renal cell carcinoma of left kidney (HCC) 12/24/2017   Fluid overload, unspecified 07/06/2017   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   Other specified disorders of kidney and ureter 11/22/2016   Secondary hyperparathyroidism of renal origin 10/23/2016   Headache disorder 08/21/2016   TIA (transient ischemic attack)    Essential hypertension    Seizure (HCC)    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    Lung nodule    Diverticulitis of cecum 07/21/2015   Diverticulitis of large intestine without perforation or abscess without bleeding    Right lower quadrant pain    Obstructive sleep apnea 02/08/2015   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   Diverticulosis 07/13/2014   Lower GI bleed 07/12/2014   Refusal of blood transfusions as patient is Jehovah's Witness    Parotid mass    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 06/13/2014   HLD (hyperlipidemia)    PRES (posterior reversible encephalopathy syndrome)    History of CVA (cerebrovascular accident) 05/23/2014   Anemia of chronic disease 05/23/2014   GERD (gastroesophageal reflux disease) 12/09/2012   Gastrointestinal bleeding 11/12/2012   Melena 11/12/2012   Other psychoactive substance dependence, uncomplicated (HCC) 10/08/2012   Iron deficiency anemia, unspecified 06/10/2012   Moderate protein-calorie malnutrition 07/29/2009   Coagulation defect, unspecified 08/29/2006   Type 2 diabetes mellitus with diabetic peripheral angiopathy without gangrene (HCC) 08/29/2006   Hypertensive chronic kidney disease with stage 5 chronic kidney disease or end stage renal disease (HCC) 08/02/2006    PCP: [name] MD   REFERRING PROVIDER: Debby Fidela CROME, NP  REFERRING DIAG:  Diagnosis  S14.109S (ICD-10-CM) - Spinal cord injury, cervical region, sequela (HCC)    THERAPY DIAG:  Difficulty in walking, not elsewhere classified  Unsteadiness on feet  Muscle weakness (generalized)  Repeated falls  Rationale for Evaluation and Treatment: Rehabilitation  ONSET DATE:  ACDF 05/25/23  SUBJECTIVE:   SUBJECTIVE STATEMENT:   Pt repots a fall yesterday, R knee went out and he slid down the edge of the bed.   PERTINENT HISTORY: See above   ACDF C3-C5 fusion, anterior cervical corpectomy C3-C4  PAIN:  Are you having pain? 5/10 R knee  PRECAUTIONS:  Precautions:  Precautions Precautions: Fall, Cervical, Other (comment) Recall of Precautions/Restrictions: Impaired Precaution/Restrictions Comments: Verbally reviewed cervical precautions. Required Braces or Orthoses: Cervical Brace Cervical Brace: Soft collar, At all times Restrictions Weight Bearing Restrictions Per Provider Order: No General: Pain: No c/o pain.   RED FLAGS: None   WEIGHT BEARING RESTRICTIONS: No  FALLS:  Has patient fallen in last 6 months? Yes. Number of falls  3- one that led to surgery, then 2 after getting home from the hospital  LIVING ENVIRONMENT: Lives with: lives with their son Lives in: House/apartment Stairs: ground floor apt  Has following equipment at home: Single point cane, Environmental Consultant - 2 wheeled, and Environmental Consultant - 4 wheeled  OCCUPATION: retired- used to pressure wash   PLOF: Independent, Independent with basic ADLs, Independent with gait, and Independent with transfers  PATIENT GOALS: get back to normal ability, drive convertible again   NEXT MD VISIT: Dr. Cornelio 11/15/23  OBJECTIVE:  Note: Objective measures were completed at Evaluation unless otherwise noted.  DIAGNOSTIC FINDINGS:    CLINICAL DATA:  63 year old male with altered mental status.   EXAM: CT HEAD WITHOUT CONTRAST   TECHNIQUE: Contiguous axial images were obtained from the base of the skull through the vertex without intravenous contrast.   RADIATION DOSE REDUCTION: This exam was performed according to the departmental dose-optimization program which includes automated exposure control, adjustment of the mA and/or kV according to patient size and/or use of iterative reconstruction technique.   COMPARISON:  Brain MRI and head CT 05/25/2023.   FINDINGS: Brain:   Unusual extensive, diffuse pachymeningeal calcification which has been present since at least 2013. Stable cerebral volume. No midline shift, ventriculomegaly, mass effect, evidence of mass lesion, intracranial hemorrhage or evidence of cortically based acute infarction. Small area of chronic encephalomalacia in the medial left occipital lobe redemonstrated. Stable gray-white matter differentiation throughout the brain.   Vascular: Chronic severe pachymeningeal calcification.   Skull: Appears stable.  No acute osseous abnormality identified.   Sinuses/Orbits: Continued left middle ear and mastoid opacification. Other Visualized paranasal sinuses and mastoids are stable and well aerated.    Other: Anterior left scalp soft tissue injury has regressed since earlier in the month. Orbits soft tissues appears stable.   IMPRESSION: 1. No acute intracranial abnormality. 2. 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 3. Ongoing left middle ear and mastoid opacification. No acute osseous abnormality identified. 4. Regressed scalp soft tissue injury since 05/25/2023.  CLINICAL DATA:  Chronic bilateral knee pain and swelling.   EXAM: RIGHT KNEE - COMPLETE 4+ VIEW; LEFT KNEE - COMPLETE 4+ VIEW   COMPARISON:  Right knee radiographs dated 12/26/2021.   FINDINGS: Right knee:   No acute fracture or dislocation. There is a small to moderate-sized joint effusion. Moderate tricompartmental degenerative changes with joint space narrowing and marginal osteophytosis. Vascular calcifications are noted.   Left knee:  No acute fracture or dislocation. There is a small joint effusion. Moderate tricompartmental degenerative changes with joint space narrowing and marginal osteophytosis. Vascular calcifications are noted.   IMPRESSION: 1. No acute osseous abnormality. 2. 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  Narrative & Impression  CLINICAL DATA:  Chronic bilateral knee pain and swelling.   EXAM: RIGHT KNEE - COMPLETE 4+ VIEW; LEFT KNEE - COMPLETE 4+ VIEW   COMPARISON:  Right knee radiographs dated 12/26/2021.   FINDINGS: Right knee:   No acute fracture or dislocation. There is a small to moderate-sized joint effusion. Moderate tricompartmental degenerative changes with joint space narrowing and marginal osteophytosis. Vascular calcifications are noted.   Left knee:   No acute fracture or dislocation. There is a small joint effusion. Moderate tricompartmental degenerative changes with joint space narrowing and marginal  osteophytosis. Vascular calcifications are noted.   IMPRESSION: 1. No acute osseous abnormality. 2. 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    Narrative & Impression CLINICAL DATA:  Right hip pain.   EXAM: DG HIP (WITH OR WITHOUT PELVIS) 2-3V RIGHT   COMPARISON:  None Available.   FINDINGS: No acute fracture or dislocation. The bones are osteopenic. Cystic changes of the right femoral head and neck measure up to 3.7 cm. Orthopedic referral is advised. Mild arthritic changes of the right hip. Total left hip arthroplasty. The soft tissues are unremarkable. Vascular calcifications.   IMPRESSION: 1. No acute fracture or dislocation. 2. Cystic changes of the right femoral head and neck. Orthopedic referral is advised.  Lower Venous DVT Study   Patient Name:  Carl Ferrone Sr.  Date of Exam:   06/10/2023  Medical Rec #: [id number]            Accession #:    [id number]  Date of Birth: 1960/08/01             Patient Gender: M  Patient Age:   41 years  Exam Location:  [facility]  Procedure:      VAS US  LOWER EXTREMITY VENOUS (DVT)  Referring Phys: PRENTICE COMPTON    - PATIENT SURVEYS:  PSFS: THE PATIENT SPECIFIC FUNCTIONAL SCALE  Place score of 0-10 (0 = unable to perform activity and 10 = able to perform activity at the same level as before injury or problem)  Activity Date: eval 09/12/23 11/07/23 11/28/23 01/14/24  Getting in and out of car (anything lower than 20 inches)  0 0  7  2. Balance  3 5  7   3. Walking without a device  2 5  0  4.  Social activities  0 0  5  Total Score 1.25 2.5  4.75    Total Score = Sum of activity scores/number of activities  Minimally Detectable Change: 3 points (for single activity); 2 points (for average score)  [facility] (nd). The Patient Specific Functional Scale . Retrieved from  [email or web address]   DGI Moderate impairment: walks 20', slow speed, abnormal gait patterns, evidence for imbalance. (1) Moderate impairment: Makes only minor adjustments to walking speed, or accomplishes a change in speed with significant gait deviations, or changes speed but loses balance but is able to recover and continue walking. (1) Mild impairment: Performs head turns smoothly with slight change in gait velocity, i.e. minor disruption to smooth gait path or uses walking aid. (2) Mild impairment: Performs head turns smoothly with slight change  in gait velocity, i.e. minor disruption to smooth gait path or uses walking aid. (2) Moderate impairment: Turns slowly, requires verbal cueing, requires several small steps to catch balance following turn and stop. (1) Severe impairment: Cannot perform without assistance. (0) Moderate impairment: Is able to clear cones but must significantly slow speed to accomplish task, or requires verbal cueing. (1) Moderate impairment: Two feet to a stair, must use rail. (1) Total Score: 9/24 Scores below 19 suggest that the subject assessed has a higher risk of prospective falls   COGNITION: Overall cognitive status: Within functional limits for tasks assessed     SENSATION: Hx of neuropathy even before fall/surgery     LOWER EXTREMITY MMT:  MMT Right eval Left eval Right 10/24/23 Left 10/24/23 RT/Left 10/31/23 RT/L 11/05/23 RT/L 11/19/23 RT/L 11/28/23 R/L 12/17/23 R/L 12/24/23 Rt 01/02/24 R/L 01/14/24  Hip flexion 3- 3+ 3 3+ 3/3+ 3+/4 4-/4+ 4-/4+ 3+/4 3+/4 3+/4 3+, 4-  Hip extension              Hip abduction            3, 3  Hip adduction              Hip internal rotation              Hip external rotation              Knee flexion 3- 4+ 4- 5 4/5 4/5 5/5  5/5 5/5  4+, 5  Knee extension 3- 5 3- 5 3-/5 3-/5 3/5 3p!/5 3+ *P/4- 4-/4 4/4 4+, 5  Ankle dorsiflexion 4 5 5 5           Ankle plantarflexion               Ankle inversion              Ankle eversion               (Blank rows = not tested)  UPPER EXTREMITY MMT: R/L  Flexion: 3+/5, 3+/5 Extension: 4/5, 4+/5 Add: 4+/5, 4+/5 Abd: 3+/5, 4-/5   GAIT: Distance walked: in clinic distances  Assistive device utilized: Environmental Consultant - 2 wheeled Level of assistance: Modified independence Comments: slow but steady with RW                                                                                                                                TREATMENT DATE:  02/05/24 NuStep  5 x 6 min Sit to stands from elevated surface cues not to use LE against back of table Leg press 40lb 2x15 HS curls green 2x15  LAQ no weight 2x15 decrease TKE bilaterally Hip add ball squeeze  Standing march in RW  01/28/24 NuStep x6 mins level 5 seat 15 UE 12 Hamstring isometrics in short sit with physioball 10x10s  Straight leg bridge 2x10 in supine with physioball  Seated PNF D1 LE  3x10 BLE Seated  calf stretch x30 BLE with strap Seated physioball press x25, 3 sec hold   01/23/24 NuStep L 5 x 6 min LAQ 2lb 2x10 each Standing march 2lb 2x10 in RW Sit to stands from elevated surface 2x10 Bridges 2x15 SLR 2x10 each Sidelying hip abduction Attempted hip Ext  01/21/24 NuStep x7 mins level 5 seat 15 UE position 12 Gait training on TM unilateral walking x2 mins each LE unilateral gait training Bridging 3x10 hooklying with 3 sec hold Education related to home safety and completion of TM walking at home   01/16/24 NuStep L 5 x 5 min Sit to stand x10 elevated mat LUE pushing on LLE Bridges 2x10 SLR 2x10 each LE on pball bridges, K2C 2x10 Standing shoulder Ext red x15  01/14/24 PT POC reviewed, goals assessed and updated to reflect current status  01/02/24 Nustep L 6 7 min LE only Leg press ( cued to work = pressure BIL) 70# 10 x 3 sets  SL 20lb x10 each, Assist needed w/ RLE Standing march 2lb in RW 2x10 LAQ 2lb cues to for full Fom  2x10 Step up 6 in with RW 10 x CGA with assist for full TKE    12/26/23 Nustep L 6 Leg press ( cued to work = pressure BIL) 70# 10 x 3 sets  SL 20lb x10 each, Assist needed w/ RLE Standing march 2lb in RW 2x10 LAQ 2lb cues to for full Fom 2x10 4in step ups x10 each 2 rails  6in x 10  12/24/23 Nustep L 6 LE only MMT -see chart HS curls 35# 2 sets 10 SL 10 x each 15# Leg press ( cued to work = pressure BIL) 70# 10 x 3 sets LAQ 3# 3 sets 10 cued for full TKE Mini squats with RW and resisted TKE 2 sets 10 6 inch step up 10 x each with UE work on full [employer] ball core stab ex- seated and standing  PATIENT EDUCATION:  Education details: HEP, PT POC, current functional status, activity modifications Person educated: Patient Education method: Explanation Education comprehension: verbalized understanding, returned demonstration, and needs further education  HOME EXERCISE PROGRAM: Educ on getting ankle wts, adjustable up to 5 is rec as we used 3 #- okayed doing seated and standing ex with RW  Access Code: [email or web address] URL: [email or web address] Date: 01/21/2024 Prepared by: Stann Ohara  Exercises - Walking on Treadmill  - 1 x daily - 4 x weekly  ASSESSMENT:  CLINICAL IMPRESSION:  Pt has been responding well to her current regimen and was able to progress motor control. Emphasize sit to stands with glut activation. Leg pres completed below 90 degrees, cue for full ROM. Heavy UE use with stand marches. Pt repots no relief from knee injections.  dons bilateral knee braces with lateral support from the arthritis clinic. Weakness in bilateral LE. Pt unable to achieve full LE ext with LAQ. Very little ROM with hip abduction due to weakness. Extensor lag with SLR. Pt unable to extend hip when laying prone. Educated on home safety and progression of exercises with his sons help.  Pt stands to benefit from continued skilled physical therapy to address deficit areas  and restore safety with activities and participations at home and in the community.        Patient is a 63 y.o. M who was seen today for physical therapy evaluation and treatment for S14.109S (ICD-10-CM) - Spinal cord injury, cervical region, sequela (HCC) Pt tolerated a initial progression to therapy well.  RLE is significantly weaker than R. Pt had log legs and a relatively short torso making it difficult to stand from low surfaces in conjunction with weak LE. Decrease coordination with side steps. No pain during session. Limited ROM with RLE during isolated exercises.  Will make every effort to improve function and work towards goals.   OBJECTIVE IMPAIRMENTS: Abnormal gait, decreased activity tolerance, decreased balance, decreased coordination, decreased knowledge of use of DME, decreased mobility, difficulty walking, decreased strength, postural dysfunction, and pain.   ACTIVITY LIMITATIONS: standing, squatting, stairs, transfers, bed mobility, toileting, dressing, and locomotion level  PARTICIPATION LIMITATIONS: shopping, community activity, and yard work  PERSONAL FACTORS: Age, Behavior pattern, Education, Fitness, Past/current experiences, Profession, Social background, and Time since onset of injury/illness/exacerbation are also affecting patient's functional outcome.   REHAB POTENTIAL: Good  CLINICAL DECISION MAKING: Evolving/moderate complexity  EVALUATION COMPLEXITY: Moderate   GOALS: Goals reviewed with patient? No  SHORT TERM GOALS: Target date: 10/24/2023   Will be compliant with appropriate progressive HEP  Baseline: Goal status: 10/08/23 MET  2.  Will be able to consistently perform functional STS tranfers from surfaces 20 inches or less  Baseline:  Goal status:10/08/23 on going  10/31/23 progressing still difficult for pt, 11/05/23 progressing still difficult for pt uses his UE, ongoing 11/19/23, Ongoing 11/28/23  Ongoing 12/24/23- still really struggles with this   01/02/24 on going, 01/14/24 inc difficulty without elevated surface 30+in   3.  Will be able to ambulate at least 386ft with quad cane no more than light MinA  Baseline:  Goal status: 10/08/23 progressing  and 10/31/23  12/24/23 progressing  and 01/02/24, 01/14/24 progressing distance and assist RLE instability limits distance for safety, 261ft SBA  4.  MMT to be at least 3+/5 in all tested groups R LE  Baseline:  Goal status: ongoing 10/24/23  and 10/31/23, Progressing 11/19/23, Progressing 11/28/23  MET 12/24/23    LONG TERM GOALS: Target date: 02/11/24   R LE MMT to be at least 4-/5 in all tested groups  Baseline:  Goal status: ongoing 10/24/23, Ongoing 11/05/23, Ongoing 11/19/23; progressing 12/17/23   01/02/24 progressing, 01/14/24-progressing  2.  Will score at least 18 on DGI with no device to show reduced fall risk  01/14/24: 9/24 Goal status: INITIAL  3.  Will be able to reciprocally ascend/descend steps without difficulty and U rail  Baseline:  Goal status: Ongoing 10/24/23, 11/28/23 and 12/24/23 needs BIL, 01/14/24 inc reliance on BUE HR   4.  Will be able to ambulate community distances with LRAD, Mod(I) to allow for improved community access and participating in social activities  Baseline:  Goal status: Progressing 11/05/23, progressing 01/14/24 with RW  5.  Will be compliant with appropriate gym based exercise program by time of DC  Baseline:  Goal status: reports going to gym some with his son , using bicycle., Progressing 11/05/23 goes with his son; hasn't been recently 12/17/23, 12/2/5, continue to progress towards increased options for workout routine at gym   PLAN:  PT FREQUENCY: 2x/week  PT DURATION: 12 weeks  PLANNED INTERVENTIONS: 97750- Physical Performance Testing, 97110-Therapeutic exercises, 97530- Therapeutic activity, 97112- Neuromuscular re-education, 97535- Self Care, 02859- Manual therapy, and 97116- Gait training  PLAN FOR NEXT SESSION: progress func strength,  gait and balance.  Tanda Sorrow, PTA 02/05/2024 9:00 AM  OUTPATIENT PHYSICAL THERAPY LOWER EXTREMITY TREATMENT   Patient Name: Carl Currey Sr. MRN: [id number] DOB:28-May-1960, 63 y.o., male Today's Date: 02/05/2024  END OF SESSION:  PT End of Session - 02/05/24 0859     Visit Number 25    Date for Recertification  02/11/24    PT Start Time 0850    PT Stop Time 0930    PT Time Calculation (min) 40 min    Activity Tolerance Patient tolerated treatment well    Behavior During Therapy [facility] for tasks assessed/performed              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    Parathyroid  abnormality    ectopic parathyroid  gland   Presence of arteriovenous fistula for hemodialysis, primary    RUE PER PT RLE   Refusal of blood product    NO WHOLE BLOOD PROUCTS   Renal cell carcinoma (HCC)    s/p hand assisted laparoscopic bilateral nephrectomies 11/29/17, + RCC left   Secondary hyperparathyroidism    Seizures (HCC)    one episode in past, due to elevated Potassium 08/02/20- at least 4 years ago   Sleep apnea    doesn't use CPAP anymore since weight loss   Stroke [facility])    no residual   Past Surgical History:  Procedure Laterality Date   ABDOMINAL AORTOGRAM W/LOWER EXTREMITY N/A 12/09/2023   Procedure: ABDOMINAL AORTOGRAM W/LOWER EXTREMITY;  Surgeon:  Sheree Penne Bruckner, MD;  Location: [facility] INVASIVE CV LAB;  Service: Cardiovascular;  Laterality: N/A;   ANTERIOR CERVICAL CORPECTOMY N/A 05/25/2023   Procedure: ANTERIOR CERVICAL CORPECTOMY CERVICAL FOUR, CERVICAL THREE - CERVICAL FIVE FUSION;  Surgeon: [name] Shove, MD;  Location: [facility] OR;  Service: Neurosurgery;  Laterality: N/A;   AV FISTULA PLACEMENT Right    right arm   BIOPSY  08/01/2019   Procedure: BIOPSY;  Surgeon: Kristie Lamprey, MD;  Location: [facility] ENDOSCOPY;  Service: Endoscopy;;   BIOPSY  11/18/2020   Procedure: BIOPSY;  Surgeon: Stacia Glendia BRAVO, MD;  Location: [facility] ENDOSCOPY;  Service: Gastroenterology;;   BIOPSY  09/13/2021   Procedure: BIOPSY;  Surgeon: Wilhelmenia Aloha Raddle., MD;  Location: [facility] ENDOSCOPY;  Service: Gastroenterology;;   CATARACT EXTRACTION W/ INTRAOCULAR LENS  IMPLANT, BILATERAL     COLON SURGERY     COLONOSCOPY N/A 08/04/2015   Procedure: COLONOSCOPY;  Surgeon: Gordy CHRISTELLA Starch, MD;  Location: [facility] ENDOSCOPY;  Service: Endoscopy;  Laterality: N/A;   COLONOSCOPY  2017   JMP@ [facility]-good prep-mass -recall 1 yr   COLONOSCOPY N/A 09/13/2021   Procedure: COLONOSCOPY;  Surgeon: Mansouraty, Aloha Raddle., MD;  Location: [facility] ENDOSCOPY;  Service: Gastroenterology;  Laterality: N/A;   COLONOSCOPY WITH PROPOFOL  N/A 11/25/2020   Procedure: COLONOSCOPY WITH PROPOFOL ;  Surgeon: Federico Rosario BROCKS, MD;  Location: [facility]  ENDOSCOPY;  Service: Gastroenterology;  Laterality: N/A;   COLONOSCOPY WITH PROPOFOL  N/A 09/23/2021   Procedure: COLONOSCOPY WITH PROPOFOL ;  Surgeon: San Sandor GAILS, DO;  Location: [facility] ENDOSCOPY;  Service: Gastroenterology;  Laterality: N/A;   ENTEROSCOPY N/A 09/23/2021   Procedure: ENTEROSCOPY;  Surgeon: San Sandor GAILS, DO;  Location: [facility] ENDOSCOPY;  Service: Gastroenterology;  Laterality: N/A;  Will place order for video capsule study as we may opt to place it during procedure   ESOPHAGOGASTRODUODENOSCOPY N/A 08/01/2019   Procedure: ESOPHAGOGASTRODUODENOSCOPY (EGD);  Surgeon:  Kristie Lamprey, MD;  Location: [facility] ENDOSCOPY;  Service: Endoscopy;  Laterality: N/A;   ESOPHAGOGASTRODUODENOSCOPY N/A 11/18/2020   Procedure: ESOPHAGOGASTRODUODENOSCOPY (EGD);  Surgeon: Stacia Glendia BRAVO, MD;  Location: [facility] ENDOSCOPY;  Service: Gastroenterology;  Laterality: N/A;   ESOPHAGOGASTRODUODENOSCOPY (EGD) WITH PROPOFOL  N/A 08/04/2019   Procedure: ESOPHAGOGASTRODUODENOSCOPY (EGD) WITH PROPOFOL ;  Surgeon: Leigh Elspeth SQUIBB, MD;  Location: [facility] ENDOSCOPY;  Service: Gastroenterology;  Laterality: N/A;   ESOPHAGOGASTRODUODENOSCOPY (EGD) WITH PROPOFOL  N/A 09/13/2021   Procedure: ESOPHAGOGASTRODUODENOSCOPY (EGD) WITH PROPOFOL ;  Surgeon: Wilhelmenia Aloha Raddle., MD;  Location: [facility] ENDOSCOPY;  Service: Gastroenterology;  Laterality: N/A;   GIVENS CAPSULE STUDY N/A 09/23/2021   Procedure: GIVENS CAPSULE STUDY;  Surgeon: San Sandor GAILS, DO;  Location: [facility] ENDOSCOPY;  Service: Gastroenterology;  Laterality: N/A;   graft left arm Left    for dialysis x 2. Removed   HOT HEMOSTASIS  11/18/2020   Procedure: HOT HEMOSTASIS (ARGON PLASMA COAGULATION/BICAP);  Surgeon: Stacia Glendia BRAVO, MD;  Location: [facility] ENDOSCOPY;  Service: Gastroenterology;;   HOT HEMOSTASIS N/A 09/23/2021   Procedure: HOT HEMOSTASIS (ARGON PLASMA COAGULATION/BICAP);  Surgeon: San Sandor GAILS, DO;  Location: [facility] ENDOSCOPY;  Service: Gastroenterology;  Laterality: N/A;   INSERTION OF DIALYSIS CATHETER     Rt chest   LAPAROSCOPIC RIGHT COLECTOMY N/A 08/05/2015   Procedure: LAPAROSCOPIC RIGHT COLECTOMY- ASCENDING;  Surgeon: Jina Nephew, MD;  Location: [facility] OR;  Service: General;  Laterality: N/A;   LOWER EXTREMITY ANGIOGRAPHY N/A 12/09/2023   Procedure: Lower Extremity Angiography;  Surgeon: Sheree Penne Bruckner, MD;  Location: [facility] INVASIVE CV LAB;  Service: Cardiovascular;  Laterality: N/A;   LOWER EXTREMITY INTERVENTION N/A 12/09/2023   Procedure: LOWER EXTREMITY INTERVENTION;  Surgeon: Sheree Penne Bruckner, MD;  Location: [facility]  INVASIVE CV LAB;  Service: Cardiovascular;  Laterality: N/A;   MASS EXCISION Left 05/28/2019   Procedure: EXCISION SOFT TISSUE MASS LEFT SHOULDER;  Surgeon: Eletha Boas, MD;  Location: [facility] ORS;  Service: General;  Laterality: Left;   NEPHRECTOMY Bilateral    PARATHYROIDECTOMY N/A 06/12/2016   Procedure: TOTAL PARATHYROIDECTOMY WITH AUTOTRANSPLANTATION TO LEFT FOREARM;  Surgeon: Boas Eletha, MD;  Location: [facility] OR;  Service: General;  Laterality: N/A;   PARATHYROIDECTOMY N/A 10/23/2016   Procedure: PARATHYROIDECTOMY;  Surgeon: Eletha Boas, MD;  Location: [facility] OR;  Service: General;  Laterality: N/A;   REVERSE SHOULDER ARTHROPLASTY Right 08/24/2020   Procedure: REVERSE SHOULDER ARTHROPLASTY;  Surgeon: Cristy Bonner DASEN, MD;  Location: [facility] OR;  Service: Orthopedics;  Laterality: Right;   REVISON OF ARTERIOVENOUS FISTULA Right 07/16/2017   Procedure: REVISION OF ARTERIOVENOUS FISTULA  Right ARM;  Surgeon: Sheree Penne Bruckner, MD;  Location: [facility] OR;  Service: Vascular;  Laterality: Right;   TOTAL HIP ARTHROPLASTY Left 11/14/2020   Procedure: LEFT TOTAL HIP ARTHROPLASTY ANTERIOR APPROACH;  Surgeon: [name] Kay HERO, MD;  Location: [facility] OR;  Service: Orthopedics;  Laterality: Left;   UPPER GASTROINTESTINAL ENDOSCOPY  2021   @ [facility]   Patient Active Problem List   Diagnosis  Date Noted   Right knee buckling 11/15/2023   Primary osteoarthritis of right knee 11/15/2023   Abnormality of gait 11/15/2023   Primary osteoarthritis of left knee 10/03/2023   Right hip pain 07/14/2023   Coping style affecting medical condition 06/05/2023   Spinal cord injury, cervical region, sequela 05/30/2023   Cervical vertebral fracture (HCC) 05/25/2023   History of GI bleed 05/25/2023   C4 cervical fracture (HCC) 05/25/2023   Duodenitis    Gastritis and gastroduodenitis    Ischemic colitis    Bright red blood per rectum    ABLA (acute blood loss anemia)    Gastric ulcer    Duodenal ulcer    Diverticulosis of colon without  hemorrhage    GI bleed 09/17/2021   Acute upper GI bleeding 09/11/2021   Mucosal abnormality of esophagus    Mucosal abnormality of stomach    Mucosal abnormality of duodenum    Upper GI bleed    Left hip pain 11/16/2020   Type 2 diabetes mellitus with other specified complication (HCC) 11/14/2020   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/14/2020   Primary osteoarthritis of left hip 10/07/2020   Status post reverse total arthroplasty of right shoulder 08/24/2020   Diverticulitis 03/08/2020   ESRD (end stage renal disease) (HCC) 03/08/2020   Polyneuropathy in diseases classified elsewhere 03/08/2020   Pure hypercholesterolemia 03/08/2020   Sacral back pain 03/08/2020   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 11/10/2019   Acute gastric ulcer with hemorrhage    Acute GI bleeding 07/31/2019   Hypotension of hemodialysis 07/31/2019   Diverticulitis large intestine 07/31/2019   Mass of soft tissue of shoulder 05/26/2019   Awaiting organ transplant status 03/18/2019   Finding of cocaine in blood 02/25/2019   Anaphylactic shock, unspecified, sequela 11/21/2018   Hypercalcemia 10/28/2018   Diarrhea, unspecified 08/08/2018   Hyperkalemia 03/12/2018   Renal cell carcinoma of left kidney (HCC) 12/24/2017   Fluid overload, unspecified 07/06/2017   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   Other specified disorders of kidney and ureter 11/22/2016   Secondary hyperparathyroidism of renal origin 10/23/2016   Headache disorder 08/21/2016   TIA (transient ischemic attack)    Essential hypertension    Seizure (HCC)    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    Lung nodule    Diverticulitis of cecum 07/21/2015   Diverticulitis of large intestine without perforation or abscess without bleeding    Right lower quadrant pain    Obstructive sleep apnea 02/08/2015   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   Diverticulosis 07/13/2014   Lower GI bleed 07/12/2014   Refusal  of blood transfusions as patient is Jehovah's Witness    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   HLD (hyperlipidemia)    PRES (posterior reversible encephalopathy syndrome)    History of CVA (cerebrovascular accident) 05/23/2014   Anemia of chronic disease 05/23/2014   GERD (gastroesophageal reflux disease) 12/09/2012   Gastrointestinal bleeding 11/12/2012   Melena 11/12/2012   Other psychoactive substance dependence, uncomplicated (HCC) 10/08/2012   Iron deficiency anemia, unspecified 06/10/2012   Moderate protein-calorie malnutrition 07/29/2009   Coagulation defect, unspecified 08/29/2006   Type 2 diabetes mellitus with diabetic peripheral angiopathy without gangrene (HCC) 08/29/2006   Hypertensive chronic kidney disease with stage 5 chronic kidney disease or end stage renal disease (HCC) 08/02/2006    PCP: [name] MD   REFERRING PROVIDER: Debby Fidela CROME, NP  REFERRING DIAG:  Diagnosis  S14.109S (ICD-10-CM) - Spinal cord injury, cervical region, sequela (HCC)    THERAPY DIAG:  Difficulty in walking, not elsewhere classified  Unsteadiness on feet  Muscle weakness (generalized)  Repeated falls  Rationale for Evaluation and Treatment: Rehabilitation  ONSET DATE:  ACDF 05/25/23  SUBJECTIVE:   SUBJECTIVE STATEMENT:   Had a fall last night, R knee gave out slig off hte edge of the bed   PERTINENT HISTORY: See above   ACDF C3-C5 fusion, anterior cervical corpectomy C3-C4  PAIN:  Are you having pain? 5/10 R knee  PRECAUTIONS:  Precautions:  Precautions Precautions: Fall, Cervical, Other (comment) Recall of Precautions/Restrictions: Impaired Precaution/Restrictions Comments: Verbally reviewed cervical precautions. Required Braces or Orthoses: Cervical Brace Cervical Brace: Soft collar, At all times Restrictions Weight Bearing Restrictions Per Provider Order: No General: Pain: No c/o pain.   RED  FLAGS: None   WEIGHT BEARING RESTRICTIONS: No  FALLS:  Has patient fallen in last 6 months? Yes. Number of falls 3- one that led to surgery, then 2 after getting home from the hospital  LIVING ENVIRONMENT: Lives with: lives with their son Lives in: House/apartment Stairs: ground floor apt  Has following equipment at home: Single point cane, Environmental Consultant - 2 wheeled, and Environmental Consultant - 4 wheeled  OCCUPATION: retired- used to pressure wash   PLOF: Independent, Independent with basic ADLs, Independent with gait, and Independent with transfers  PATIENT GOALS: get back to normal ability, drive convertible again   NEXT MD VISIT: Dr. Cornelio 11/15/23  OBJECTIVE:  Note: Objective measures were completed at Evaluation unless otherwise noted.  DIAGNOSTIC FINDINGS:    CLINICAL DATA:  63 year old male with altered mental status.   EXAM: CT HEAD WITHOUT CONTRAST   TECHNIQUE: Contiguous axial images were obtained from the base of the skull through the vertex without intravenous contrast.   RADIATION DOSE REDUCTION: This exam was performed according to the departmental dose-optimization program which includes automated exposure control, adjustment of the mA and/or kV according to patient size and/or use of iterative reconstruction technique.   COMPARISON:  Brain MRI and head CT 05/25/2023.   FINDINGS: Brain:   Unusual extensive, diffuse pachymeningeal calcification which has been present since at least 2013. Stable cerebral volume. No midline shift, ventriculomegaly, mass effect, evidence of mass lesion, intracranial hemorrhage or evidence of cortically based acute infarction. Small area of chronic encephalomalacia in the medial left occipital lobe redemonstrated. Stable gray-white matter differentiation throughout the brain.   Vascular: Chronic severe pachymeningeal calcification.   Skull: Appears stable.  No acute osseous abnormality identified.   Sinuses/Orbits: Continued left  middle ear and mastoid opacification. Other Visualized paranasal sinuses and mastoids are stable and well aerated.   Other: Anterior left scalp soft tissue injury has regressed since earlier in the month. Orbits soft tissues appears stable.   IMPRESSION: 1. No acute intracranial abnormality. 2. 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 3. Ongoing left middle ear and mastoid opacification. No acute osseous abnormality identified. 4. Regressed scalp soft tissue injury since 05/25/2023.  CLINICAL DATA:  Chronic bilateral knee pain and swelling.   EXAM: RIGHT KNEE - COMPLETE 4+ VIEW; LEFT KNEE - COMPLETE 4+ VIEW   COMPARISON:  Right knee radiographs dated 12/26/2021.   FINDINGS: Right knee:   No acute fracture or dislocation. There is a small to moderate-sized joint effusion. Moderate tricompartmental degenerative changes with joint space narrowing and marginal osteophytosis. Vascular calcifications are noted.   Left knee:   No acute fracture or  dislocation. There is a small joint effusion. Moderate tricompartmental degenerative changes with joint space narrowing and marginal osteophytosis. Vascular calcifications are noted.   IMPRESSION: 1. No acute osseous abnormality. 2. 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  Narrative & Impression  CLINICAL DATA:  Chronic bilateral knee pain and swelling.   EXAM: RIGHT KNEE - COMPLETE 4+ VIEW; LEFT KNEE - COMPLETE 4+ VIEW   COMPARISON:  Right knee radiographs dated 12/26/2021.   FINDINGS: Right knee:   No acute fracture or dislocation. There is a small to moderate-sized joint effusion. Moderate tricompartmental degenerative changes with joint space narrowing and marginal osteophytosis. Vascular calcifications are noted.   Left knee:   No acute fracture or dislocation. There is a  small joint effusion. Moderate tricompartmental degenerative changes with joint space narrowing and marginal osteophytosis. Vascular calcifications are noted.   IMPRESSION: 1. No acute osseous abnormality. 2. 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    Narrative & Impression CLINICAL DATA:  Right hip pain.   EXAM: DG HIP (WITH OR WITHOUT PELVIS) 2-3V RIGHT   COMPARISON:  None Available.   FINDINGS: No acute fracture or dislocation. The bones are osteopenic. Cystic changes of the right femoral head and neck measure up to 3.7 cm. Orthopedic referral is advised. Mild arthritic changes of the right hip. Total left hip arthroplasty. The soft tissues are unremarkable. Vascular calcifications.   IMPRESSION: 1. No acute fracture or dislocation. 2. Cystic changes of the right femoral head and neck. Orthopedic referral is advised.  Lower Venous DVT Study   Patient Name:  Carl Benish Sr.  Date of Exam:   06/10/2023  Medical Rec #: [id number]            Accession #:    [id number]  Date of Birth: Nov 13, 1960             Patient Gender: M  Patient Age:   47 years  Exam Location:  [facility]  Procedure:      VAS US  LOWER EXTREMITY VENOUS (DVT)  Referring Phys: PRENTICE COMPTON    - PATIENT SURVEYS:  PSFS: THE PATIENT SPECIFIC FUNCTIONAL SCALE  Place score of 0-10 (0 = unable to perform activity and 10 = able to perform activity at the same level as before injury or problem)  Activity Date: eval 09/12/23 11/07/23 11/28/23 01/14/24  Getting in and out of car (anything lower than 20 inches)  0 0  7  2. Balance  3 5  7   3. Walking without a device  2 5  0  4.  Social activities  0 0  5  Total Score 1.25 2.5  4.75    Total Score = Sum of activity scores/number of activities  Minimally Detectable Change: 3 points (for single activity); 2 points (for average score)  [facility]  (nd). The Patient Specific Functional Scale . Retrieved from [email or web address]   DGI Moderate impairment: walks 20', slow speed, abnormal gait patterns, evidence for imbalance. (1) Moderate impairment: Makes only minor adjustments to walking speed, or accomplishes a change in speed with significant gait deviations, or changes speed but loses balance but is able to recover and continue walking. (1) Mild impairment: Performs head turns smoothly with slight change in gait velocity, i.e. minor disruption to smooth gait path or uses walking aid. (2) Mild impairment: Performs head turns smoothly with slight change in gait velocity, i.e.  minor disruption to smooth gait path or uses walking aid. (2) Moderate impairment: Turns slowly, requires verbal cueing, requires several small steps to catch balance following turn and stop. (1) Severe impairment: Cannot perform without assistance. (0) Moderate impairment: Is able to clear cones but must significantly slow speed to accomplish task, or requires verbal cueing. (1) Moderate impairment: Two feet to a stair, must use rail. (1) Total Score: 9/24 Scores below 19 suggest that the subject assessed has a higher risk of prospective falls   COGNITION: Overall cognitive status: Within functional limits for tasks assessed     SENSATION: Hx of neuropathy even before fall/surgery     LOWER EXTREMITY MMT:  MMT Right eval Left eval Right 10/24/23 Left 10/24/23 RT/Left 10/31/23 RT/L 11/05/23 RT/L 11/19/23 RT/L 11/28/23 R/L 12/17/23 R/L 12/24/23 Rt 01/02/24 R/L 01/14/24  Hip flexion 3- 3+ 3 3+ 3/3+ 3+/4 4-/4+ 4-/4+ 3+/4 3+/4 3+/4 3+, 4-  Hip extension              Hip abduction            3, 3  Hip adduction              Hip internal rotation              Hip external rotation              Knee flexion 3- 4+ 4- 5 4/5 4/5 5/5  5/5 5/5  4+, 5  Knee extension 3- 5 3- 5 3-/5 3-/5 3/5 3p!/5 3+ *P/4- 4-/4 4/4 4+, 5   Ankle dorsiflexion 4 5 5 5           Ankle plantarflexion              Ankle inversion              Ankle eversion               (Blank rows = not tested)  UPPER EXTREMITY MMT: R/L  Flexion: 3+/5, 3+/5 Extension: 4/5, 4+/5 Add: 4+/5, 4+/5 Abd: 3+/5, 4-/5   GAIT: Distance walked: in clinic distances  Assistive device utilized: Environmental Consultant - 2 wheeled Level of assistance: Modified independence Comments: slow but steady with RW                                                                                                                                TREATMENT DATE:  02/05/24 NuStep L 5 x 6 min Sit to stands from elevated mat 2x10 cues not to push LE against table  Leg press 40lb 2x15 01/28/24 NuStep x6 mins level 5 seat 15 UE 12 Hamstring isometrics in short sit with physioball 10x10s  Straight leg bridge 2x10 in supine with physioball  Seated PNF D1 LE  3x10 BLE Seated calf stretch x30 BLE with strap Seated physioball press x25, 3 sec hold   01/23/24 NuStep L 5 x 6 min LAQ 2lb 2x10 each  Standing march 2lb 2x10 in RW Sit to stands from elevated surface 2x10 Bridges 2x15 SLR 2x10 each Sidelying hip abduction Attempted hip Ext  01/21/24 NuStep x7 mins level 5 seat 15 UE position 12 Gait training on TM unilateral walking x2 mins each LE unilateral gait training Bridging 3x10 hooklying with 3 sec hold Education related to home safety and completion of TM walking at home   01/16/24 NuStep L 5 x 5 min Sit to stand x10 elevated mat LUE pushing on LLE Bridges 2x10 SLR 2x10 each LE on pball bridges, K2C 2x10 Standing shoulder Ext red x15  01/14/24 PT POC reviewed, goals assessed and updated to reflect current status  01/02/24 Nustep L 6 7 min LE only Leg press ( cued to work = pressure BIL) 70# 10 x 3 sets  SL 20lb x10 each, Assist needed w/ RLE Standing march 2lb in RW 2x10 LAQ 2lb cues to for full Fom 2x10 Step up 6 in with RW 10 x CGA with assist for full  TKE    12/26/23 Nustep L 6 Leg press ( cued to work = pressure BIL) 70# 10 x 3 sets  SL 20lb x10 each, Assist needed w/ RLE Standing march 2lb in RW 2x10 LAQ 2lb cues to for full Fom 2x10 4in step ups x10 each 2 rails  6in x 10  12/24/23 Nustep L 6 LE only MMT -see chart HS curls 35# 2 sets 10 SL 10 x each 15# Leg press ( cued to work = pressure BIL) 70# 10 x 3 sets LAQ 3# 3 sets 10 cued for full TKE Mini squats with RW and resisted TKE 2 sets 10 6 inch step up 10 x each with UE work on full [employer] ball core stab ex- seated and standing  12/19/23 Leg press ( cued to work = pressure BIL)60 # 10 , 70# 10 x LAQ 3# 2x10 HS curls green 2x10 STS elevated mat 2 sets 5 with cuing to lean fwd and push thru legs and bend knees to sit vs lean back and brace with back of leg Nustep L 6 LE only 6 inch step up 10 x each with UE work on full [employer] ball core stab ex- seated and standing    12/17/23: Nustep Lvl 5 for 7 min MMTs of the BLE (see above for results) Sit>Stand from elevated surface 2 x 8 Seated Trunk Rotations with Red Medball 2 x 10 each side Modified Sit Up edge of plinth 2 x 10 Seated Marches with green band around feet 2 x 8 each Seated Clamshells with green Tband 2 x 12  11/28/23 Nustep L 5 7 min LE Only S2S form elevated mat with UE 2x10 Leg press 50lb 2x10 LAQ 2lb 2x10 HS curls green 2x10      PATIENT EDUCATION:  Education details: HEP, PT POC, current functional status, activity modifications Person educated: Patient Education method: Explanation Education comprehension: verbalized understanding, returned demonstration, and needs further education  HOME EXERCISE PROGRAM: Educ on getting ankle wts, adjustable up to 5 is rec as we used 3 #- okayed doing seated and standing ex with RW  Access Code: [email or web address] URL: [email or web address] Date: 01/21/2024 Prepared by: Stann Ohara  Exercises - Walking on Treadmill  - 1 x  daily - 4 x weekly  ASSESSMENT:  CLINICAL IMPRESSION:  Pt has been responding well to her current regimen and was able to progress motor control, soft tissue elongation, and strengthening through functional movement  patterns. Discussed surgery and function related to outcomes with pt.   dons bilateral knee braces with lateral support from the arthritis clinic. Weakness in bilateral LE. Pt unable to achieve full LE ext with LAQ. Very little ROM with hip abduction due to weakness. Extensor lag with SLR. Pt unable to extend hip when laying prone. Educated on home safety and progression of exercises with his sons help.  Pt stands to benefit from continued skilled physical therapy to address deficit areas and restore safety with activities and participations at home and in the community.        Patient is a 63 y.o. M who was seen today for physical therapy evaluation and treatment for S14.109S (ICD-10-CM) - Spinal cord injury, cervical region, sequela (HCC) Pt tolerated a initial progression to therapy well. RLE is significantly weaker than R. Pt had log legs and a relatively short torso making it difficult to stand from low surfaces in conjunction with weak LE. Decrease coordination with side steps. No pain during session. Limited ROM with RLE during isolated exercises.  Will make every effort to improve function and work towards goals.   OBJECTIVE IMPAIRMENTS: Abnormal gait, decreased activity tolerance, decreased balance, decreased coordination, decreased knowledge of use of DME, decreased mobility, difficulty walking, decreased strength, postural dysfunction, and pain.   ACTIVITY LIMITATIONS: standing, squatting, stairs, transfers, bed mobility, toileting, dressing, and locomotion level  PARTICIPATION LIMITATIONS: shopping, community activity, and yard work  PERSONAL FACTORS: Age, Behavior pattern, Education, Fitness, Past/current experiences, Profession, Social background, and Time since onset  of injury/illness/exacerbation are also affecting patient's functional outcome.   REHAB POTENTIAL: Good  CLINICAL DECISION MAKING: Evolving/moderate complexity  EVALUATION COMPLEXITY: Moderate   GOALS: Goals reviewed with patient? No  SHORT TERM GOALS: Target date: 10/24/2023   Will be compliant with appropriate progressive HEP  Baseline: Goal status: 10/08/23 MET  2.  Will be able to consistently perform functional STS tranfers from surfaces 20 inches or less  Baseline:  Goal status:10/08/23 on going  10/31/23 progressing still difficult for pt, 11/05/23 progressing still difficult for pt uses his UE, ongoing 11/19/23, Ongoing 11/28/23  Ongoing 12/24/23- still really struggles with this  01/02/24 on going, 01/14/24 inc difficulty without elevated surface 30+in   3.  Will be able to ambulate at least 332ft with quad cane no more than light MinA  Baseline:  Goal status: 10/08/23 progressing  and 10/31/23  12/24/23 progressing  and 01/02/24, 01/14/24 progressing distance and assist RLE instability limits distance for safety, 261ft SBA  4.  MMT to be at least 3+/5 in all tested groups R LE  Baseline:  Goal status: ongoing 10/24/23  and 10/31/23, Progressing 11/19/23, Progressing 11/28/23  MET 12/24/23    LONG TERM GOALS: Target date: 02/11/24   R LE MMT to be at least 4-/5 in all tested groups  Baseline:  Goal status: ongoing 10/24/23, Ongoing 11/05/23, Ongoing 11/19/23; progressing 12/17/23   01/02/24 progressing, 01/14/24-progressing  2.  Will score at least 18 on DGI with no device to show reduced fall risk  01/14/24: 9/24 Goal status: INITIAL  3.  Will be able to reciprocally ascend/descend steps without difficulty and U rail  Baseline:  Goal status: Ongoing 10/24/23, 11/28/23 and 12/24/23 needs BIL, 01/14/24 inc reliance on BUE HR   4.  Will be able to ambulate community distances with LRAD, Mod(I) to allow for improved community access and participating in social activities  Baseline:   Goal status: Progressing 11/05/23, progressing 01/14/24 with RW  5.  Will be compliant with appropriate gym based exercise program by time of DC  Baseline:  Goal status: reports going to gym some with his son , using bicycle., Progressing 11/05/23 goes with his son; hasn't been recently 12/17/23, 12/2/5, continue to progress towards increased options for workout routine at gym   PLAN:  PT FREQUENCY: 2x/week  PT DURATION: 12 weeks  PLANNED INTERVENTIONS: 97750- Physical Performance Testing, 97110-Therapeutic exercises, 97530- Therapeutic activity, 97112- Neuromuscular re-education, 97535- Self Care, [id number]- Manual therapy, and [id number]- Gait training  PLAN FOR NEXT SESSION: progress func strength, gait and balance.  Tanda Sorrow, PTA 02/05/2024 9:00 AM                                             "

## 2024-02-07 ENCOUNTER — Ambulatory Visit

## 2024-02-07 ENCOUNTER — Ambulatory Visit
Admission: RE | Admit: 2024-02-07 | Discharge: 2024-02-07 | Disposition: A | Discharge status: Home / Self Care | Attending: Internal Medicine | Admitting: Internal Medicine

## 2024-02-07 ENCOUNTER — Ambulatory Visit (INDEPENDENT_AMBULATORY_CARE_PROVIDER_SITE_OTHER)

## 2024-02-07 ENCOUNTER — Ambulatory Visit: Payer: Self-pay | Admitting: Internal Medicine

## 2024-02-07 VITALS — BP 111/70 | HR 108 | Temp 99.8°F | Resp 18

## 2024-02-07 DIAGNOSIS — M542 Cervicalgia: Secondary | ICD-10-CM

## 2024-02-07 DIAGNOSIS — M25511 Pain in right shoulder: Secondary | ICD-10-CM | POA: Diagnosis not present

## 2024-02-07 DIAGNOSIS — R10A1 Flank pain, right side: Secondary | ICD-10-CM | POA: Diagnosis not present

## 2024-02-07 DIAGNOSIS — H60392 Other infective otitis externa, left ear: Secondary | ICD-10-CM

## 2024-02-07 MED ORDER — OFLOXACIN 0.3 % OT SOLN: 10.0000 [drp] | Freq: Every day | OTIC | 0 refills | Status: DC

## 2024-02-07 MED ORDER — LIDOCAINE 5 % EX PTCH: 1.0000 | MEDICATED_PATCH | CUTANEOUS | 0 refills | Status: AC

## 2024-02-07 NOTE — ED Triage Notes (Signed)
 Patient reports left ear noise described as banging on a drum with drainage starting 3 days ago. Also reports right-sided pain beginning 3 days ago. States right shoulder pain began after a fall 2 days ago when patient attempted to brace with right arm.  Home intervention: Tylenol  taken without relief.

## 2024-02-07 NOTE — ED Provider Notes (Signed)
 " [facility]  Note:  This document was prepared using Dragon voice recognition software and may include unintentional dictation errors.  MRN: [id number] DOB: 05-19-1960 DATE: 02/07/2024   Subjective:  Chief Complaint:  Chief Complaint  Patient presents with   Flank Pain   Shoulder Pain   Ear Drainage     HPI: Carl Spiering Sr. is a 63 y.o. male presenting for multiple issues.   First, patient presents for discharge from his left ear for the past 3 days. Reports pain to the ear and drumming sensation in the ear. He reports taking ibuprofen with relief. Reports using debrox drops recently as recommend by his doctor.   Patient also presents for fall 3 days ago. Patient reports falling on 02/05/2024 around 11pm. He reports getting out of the bed and falling. He used his right arm to trace to brace himself and stop himself from falling. Unsure if he fell into the bed. Reports pain in his right shoulder and along the right side of his neck. He is concerned about metal plate that was placed in his shoulder. Patient also reports right sided pain at his right lower ribs. Patient reports taking tylenol  without relief. Patient is currently on dialysis for end stage renal disease. Patient reports inability to abduction and flex his right arm. He reports prior hardware that moved.  Denies fever, nausea/vomiting, abdominal pain, head injury, LOC. Endorses neck pain, right shoulder pain, right flank pain, left otalgia, discharge from left ear. Presents NAD.  Prior to Admission medications  Medication Sig Start Date End Date Taking? Authorizing Provider  acetaminophen  (TYLENOL ) 325 MG tablet Take 1-2 tablets (325-650 mg total) by mouth every 4 (four) hours as needed for mild pain (pain score 1-3). 07/14/23   Love, Sharlet RAMAN, PA-C  amoxicillin  (AMOXIL ) 500 MG capsule TAKE FOUR CAPSULES BY MOUTH ONE HOUR BEFORE DENTAL WORK/PROCEDURE 01/06/24   [name] Kay HERO, MD  atorvastatin  (LIPITOR) 10 MG tablet  Take 1 tablet (10 mg total) by mouth every Thursday. 05/30/23   Lue Elsie BROCKS, MD  augmented betamethasone  dipropionate (DIPROLENE -AF) 0.05 % ointment Apply 1 Application topically 2 (two) times daily as needed for rash. 08/28/21   [provider]  calcitRIOL  (ROCALTROL ) 0.5 MCG capsule Take 3 capsules (1.5 mcg total) by mouth every Monday, Wednesday, and Friday with hemodialysis. 07/15/23   Love, Sharlet RAMAN, PA-C  camphor-menthol  (SARNA) lotion Apply topically. 02/03/21   [provider]  cinacalcet  (SENSIPAR ) 60 MG tablet     [provider]  clopidogrel  (PLAVIX ) 75 MG tablet Take 1 tablet (75 mg total) by mouth daily. 12/09/23 12/08/24  Sheree Penne Bruckner, MD  cromolyn  (OPTICROM ) 4 % ophthalmic solution Place 1 drop into both eyes daily as needed (redness).    [provider]  cyclobenzaprine  (FLEXERIL ) 10 MG tablet Take 1 tablet (10 mg total) by mouth 3 (three) times daily as needed for muscle spasms. 05/30/23   Lue Elsie BROCKS, MD  diphenhydrAMINE  (BENADRYL ) 25 mg capsule Take 1 capsule (25 mg total) by mouth every 6 (six) hours as needed for itching. 07/14/23   Love, Sharlet RAMAN, PA-C  gabapentin  (NEURONTIN ) 100 MG capsule Take 3 capsules (300 mg total) by mouth at bedtime. 05/30/23   Lue Elsie BROCKS, MD  ibuprofen (ADVIL) 800 MG tablet SMARTSIG:1 Tablet(s) By Mouth Every 12 Hours PRN 02/15/23   [provider]  LOKELMA  10 g PACK packet Take 1 packet by mouth daily. 05/16/21   [provider]  melatonin 5 MG  TABS Take 1 tablet (5 mg total) by mouth at bedtime as needed. 07/14/23   Love, Sharlet RAMAN, PA-C  Methoxy PEG-Epoetin  Beta (MIRCERA IJ) 75 mcg. 07/22/23 07/20/24  [provider]  midodrine  (PROAMATINE ) 10 MG tablet Take 1 tablet (10 mg total) by mouth every dialysis (30 min before each dialysis.). 11/15/23   Lovorn, Megan, MD  Nystatin (GERHARDT'S BUTT CREAM) CREA Apply 1 Application topically 2 (two) times daily. 07/15/23   Love,  Sharlet RAMAN, PA-C  oxyCODONE -acetaminophen  (PERCOCET/ROXICET) 5-325 MG tablet Take 1-2 tablets by mouth every 6 (six) hours as needed for severe pain (pain score 7-10) (5mg  for moderate pain and 10 mg for severe pain). 07/15/23   Love, Sharlet RAMAN, PA-C  pantoprazole  (PROTONIX ) 40 MG tablet Take 1 tablet (40 mg total) by mouth 2 (two) times daily before a meal. Patient taking differently: Take 40 mg by mouth daily as needed (for acid reflux). 09/13/21   Singh, Prashant K, MD  pentafluoroprop-tetrafluoroeth JUANA) AERO Apply 1 Application topically as needed (topical anesthesia for hemodialysis). 07/15/23   Love, Sharlet RAMAN, PA-C  polyethylene glycol powder (GLYCOLAX /MIRALAX ) 17 GM/SCOOP powder Take 17 g by mouth 2 (two) times daily as needed for mild constipation. 07/14/23   Love, Sharlet RAMAN, PA-C  predniSONE  (DELTASONE ) 10 MG tablet Take by mouth. 11/07/23   [provider]  psyllium (HYDROCIL/METAMUCIL) 95 % PACK Take 1 packet by mouth 3 (three) times daily. 07/15/23   Love, Sharlet RAMAN, PA-C  senna-docusate (SENOKOT-S) 8.6-50 MG tablet Take 2 tablets by mouth 2 (two) times daily. 05/30/23   Lue Elsie BROCKS, MD  sevelamer  carbonate (RENVELA ) 800 MG tablet Take 3,200-4,000 mg by mouth 3 (three) times daily with meals. 06/12/21   [provider]  topiramate  (TOPAMAX ) 25 MG tablet Take 1 tablet (25 mg total) by mouth 2 (two) times daily. 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    Parathyroid  abnormality    ectopic parathyroid  gland   Presence of arteriovenous fistula for hemodialysis, primary    RUE PER PT RLE   Refusal of blood product    NO WHOLE BLOOD PROUCTS   Renal cell carcinoma (HCC)    s/p hand assisted laparoscopic bilateral nephrectomies 11/29/17, + RCC left   Secondary hyperparathyroidism    Seizures (HCC)    one episode in past, due to elevated Potassium 08/02/20- at least 4 years ago   Sleep apnea    doesn't use CPAP anymore since weight loss   Stroke [facility])    no residual     Past Surgical History:  Procedure Laterality Date   ABDOMINAL AORTOGRAM W/LOWER EXTREMITY N/A 12/09/2023   Procedure: ABDOMINAL AORTOGRAM W/LOWER EXTREMITY;  Surgeon: Sheree,  Penne Bruckner, MD;  Location: [facility] INVASIVE CV LAB;  Service: Cardiovascular;  Laterality: N/A;   ANTERIOR CERVICAL CORPECTOMY N/A 05/25/2023   Procedure: ANTERIOR CERVICAL CORPECTOMY CERVICAL FOUR, CERVICAL THREE - CERVICAL FIVE FUSION;  Surgeon: [name] Shove, MD;  Location: [facility] OR;  Service: Neurosurgery;  Laterality: N/A;   AV FISTULA PLACEMENT Right    right arm   BIOPSY  08/01/2019   Procedure: BIOPSY;  Surgeon: Kristie Lamprey, MD;  Location: [facility] ENDOSCOPY;  Service: Endoscopy;;   BIOPSY  11/18/2020   Procedure: BIOPSY;  Surgeon: Stacia Glendia BRAVO, MD;  Location: [facility] ENDOSCOPY;  Service: Gastroenterology;;   BIOPSY  09/13/2021   Procedure: BIOPSY;  Surgeon: Wilhelmenia Aloha Raddle., MD;  Location: [facility] ENDOSCOPY;  Service: Gastroenterology;;   CATARACT EXTRACTION W/ INTRAOCULAR LENS  IMPLANT, BILATERAL     COLON SURGERY     COLONOSCOPY N/A 08/04/2015   Procedure: COLONOSCOPY;  Surgeon: Gordy CHRISTELLA Starch, MD;  Location: [facility] ENDOSCOPY;  Service: Endoscopy;  Laterality: N/A;   COLONOSCOPY  2017   JMP@ [facility]-good prep-mass -recall 1 yr   COLONOSCOPY N/A 09/13/2021   Procedure: COLONOSCOPY;  Surgeon: Mansouraty, Aloha Raddle., MD;  Location:  [facility] ENDOSCOPY;  Service: Gastroenterology;  Laterality: N/A;   COLONOSCOPY WITH PROPOFOL  N/A 11/25/2020   Procedure: COLONOSCOPY WITH PROPOFOL ;  Surgeon: Federico Rosario BROCKS, MD;  Location: [facility] ENDOSCOPY;  Service: Gastroenterology;  Laterality: N/A;   COLONOSCOPY WITH PROPOFOL  N/A 09/23/2021   Procedure: COLONOSCOPY WITH PROPOFOL ;  Surgeon: San Sandor GAILS, DO;  Location: [facility] ENDOSCOPY;  Service: Gastroenterology;  Laterality: N/A;   ENTEROSCOPY N/A 09/23/2021   Procedure: ENTEROSCOPY;  Surgeon: San Sandor GAILS, DO;  Location: [facility] ENDOSCOPY;  Service: Gastroenterology;  Laterality: N/A;  Will place order for video capsule study as we may opt to place it during procedure   ESOPHAGOGASTRODUODENOSCOPY N/A 08/01/2019   Procedure: ESOPHAGOGASTRODUODENOSCOPY (EGD);  Surgeon: Kristie Lamprey, MD;  Location: [facility] ENDOSCOPY;  Service: Endoscopy;  Laterality: N/A;   ESOPHAGOGASTRODUODENOSCOPY N/A 11/18/2020   Procedure: ESOPHAGOGASTRODUODENOSCOPY (EGD);  Surgeon: Stacia Glendia BRAVO, MD;  Location: [facility] ENDOSCOPY;  Service: Gastroenterology;  Laterality: N/A;   ESOPHAGOGASTRODUODENOSCOPY (EGD) WITH PROPOFOL  N/A 08/04/2019   Procedure: ESOPHAGOGASTRODUODENOSCOPY (EGD) WITH PROPOFOL ;  Surgeon: Leigh Elspeth SQUIBB, MD;  Location: [facility] ENDOSCOPY;  Service: Gastroenterology;  Laterality: N/A;   ESOPHAGOGASTRODUODENOSCOPY (EGD) WITH PROPOFOL  N/A 09/13/2021   Procedure: ESOPHAGOGASTRODUODENOSCOPY (EGD) WITH PROPOFOL ;  Surgeon: Wilhelmenia Aloha Raddle., MD;  Location: [facility] ENDOSCOPY;  Service: Gastroenterology;  Laterality: N/A;   GIVENS CAPSULE STUDY N/A 09/23/2021   Procedure: GIVENS CAPSULE STUDY;  Surgeon: San Sandor GAILS, DO;  Location: [facility] ENDOSCOPY;  Service: Gastroenterology;  Laterality: N/A;   graft left arm Left    for dialysis x 2. Removed   HOT HEMOSTASIS  11/18/2020   Procedure: HOT HEMOSTASIS (ARGON PLASMA COAGULATION/BICAP);  Surgeon: Stacia Glendia BRAVO, MD;  Location: [facility] ENDOSCOPY;  Service: Gastroenterology;;    HOT HEMOSTASIS N/A 09/23/2021   Procedure: HOT HEMOSTASIS (ARGON PLASMA COAGULATION/BICAP);  Surgeon: San Sandor GAILS, DO;  Location: [facility] ENDOSCOPY;  Service: Gastroenterology;  Laterality: N/A;   INSERTION OF DIALYSIS CATHETER     Rt chest   LAPAROSCOPIC RIGHT COLECTOMY N/A 08/05/2015   Procedure: LAPAROSCOPIC RIGHT COLECTOMY- ASCENDING;  Surgeon: Jina Nephew, MD;  Location: [facility] OR;  Service: General;  Laterality: N/A;   LOWER EXTREMITY ANGIOGRAPHY N/A 12/09/2023   Procedure: Lower Extremity Angiography;  Surgeon: Sheree Penne Bruckner, MD;  Location: [facility] INVASIVE CV LAB;  Service: Cardiovascular;  Laterality: N/A;   LOWER EXTREMITY INTERVENTION  N/A 12/09/2023   Procedure: LOWER EXTREMITY INTERVENTION;  Surgeon: Sheree Penne Bruckner, MD;  Location: [facility] INVASIVE CV LAB;  Service: Cardiovascular;  Laterality: N/A;   MASS EXCISION Left 05/28/2019   Procedure: EXCISION SOFT TISSUE MASS LEFT SHOULDER;  Surgeon: Eletha Boas, MD;  Location: [facility] ORS;  Service: General;  Laterality: Left;   NEPHRECTOMY Bilateral    PARATHYROIDECTOMY N/A 06/12/2016   Procedure: TOTAL PARATHYROIDECTOMY WITH AUTOTRANSPLANTATION TO LEFT FOREARM;  Surgeon: Boas Eletha, MD;  Location: [facility] OR;  Service: General;  Laterality: N/A;   PARATHYROIDECTOMY N/A 10/23/2016   Procedure: PARATHYROIDECTOMY;  Surgeon: Eletha Boas, MD;  Location: [facility] OR;  Service: General;  Laterality: N/A;   REVERSE SHOULDER ARTHROPLASTY Right 08/24/2020   Procedure: REVERSE SHOULDER ARTHROPLASTY;  Surgeon: Cristy Bonner DASEN, MD;  Location: [facility] OR;  Service: Orthopedics;  Laterality: Right;   REVISON OF ARTERIOVENOUS FISTULA Right 07/16/2017   Procedure: REVISION OF ARTERIOVENOUS FISTULA  Right ARM;  Surgeon: Sheree Penne Bruckner, MD;  Location: [facility] OR;  Service: Vascular;  Laterality: Right;   TOTAL HIP ARTHROPLASTY Left 11/14/2020   Procedure: LEFT TOTAL HIP ARTHROPLASTY ANTERIOR APPROACH;  Surgeon: [name] Kay HERO, MD;  Location: [facility] OR;  Service:  Orthopedics;  Laterality: Left;   UPPER GASTROINTESTINAL ENDOSCOPY  2021   @ [facility]    Family History  Problem Relation Age of Onset   Diabetes Father    Stroke Father    Hypertension Father    Uterine cancer Mother    Lupus Sister    Stroke Sister    Hypertension Sister    Anuerysm Brother        brain   Colon cancer Neg Hx    Esophageal cancer Neg Hx    Stomach cancer Neg Hx    Pancreatic cancer Neg Hx    Liver disease Neg Hx    Colon polyps Neg Hx    Rectal cancer Neg Hx     Social History[2]  Review of Systems  Constitutional:  Negative for fever.  HENT:  Positive for ear discharge and ear pain.   Gastrointestinal:  Negative for abdominal pain, nausea and vomiting.  Musculoskeletal:  Positive for arthralgias, myalgias and neck pain.  Neurological:  Negative for syncope.     Objective:   Vitals: BP 111/70 (BP Location: Left Arm)   Pulse (!) 108   Temp 99.8 F (37.7 C) (Oral)   Resp 18   SpO2 92%   Physical Exam Constitutional:      General: He is not in acute distress.    Appearance: Normal appearance. He is well-developed and overweight. He is not ill-appearing or toxic-appearing.     Comments: Patient presented in a wheelchair  HENT:     Head: Normocephalic and atraumatic.     Right Ear: There is impacted cerumen.     Left Ear: Tympanic membrane normal. Drainage present.  Cardiovascular:     Rate and Rhythm: Normal rate and regular rhythm.     Heart sounds: Normal heart sounds.  Pulmonary:     Effort: Pulmonary effort is normal.     Breath sounds: Normal breath sounds.     Comments: Clear to auscultation bilaterally  Chest:     Chest wall: Tenderness present.     Comments: TTP along right lower ribs.  Abdominal:     General: Bowel sounds are normal.     Palpations: Abdomen is soft.     Tenderness: There is no abdominal tenderness.  Musculoskeletal:     Right  shoulder: Tenderness present. Decreased range of motion. Normal pulse.     Cervical  back: Tenderness present. Decreased range of motion.     Comments: TTP along right humerus. Decreased ROM in right shoulder/arm due to pain with abduction and flexion. NV intact.   TTP along right aspect of neck along sternocleidomastoid.  Skin:    General: Skin is warm and dry.  Neurological:     General: No focal deficit present.     Mental Status: He is alert.  Psychiatric:        Mood and Affect: Mood and affect normal.     Results:  Labs: No results found for this or any previous visit (from the past 24 hours).  Radiology: No results found.   UC Course/Treatments:  Procedures: Procedures   Medications Ordered in UC: Medications - No data to display   Assessment and Plan :     ICD-10-CM   1. Other infective acute otitis externa of left ear  H60.392     2. Neck pain  M54.2 DG Cervical Spine 2-3 Views    DG Cervical Spine 2-3 Views    3. Acute pain of right shoulder  M25.511 DG Shoulder Right    DG Shoulder Right    4. Right flank pain  R10.A1 DG Ribs Unilateral W/Chest Right    DG Ribs Unilateral W/Chest Right     Other infective acute otitis externa of left ear Afebrile, nontoxic-appearing, NAD. VSS. DDX includes but not limited to: otitis media, otitis externa, eustachian tube dysfunction, cerumen impaction Purulent discharge noted in left EAC. Ofloxacin  0.3% 10 drops every day was prescribed. Strict ED precautions were given and patient verbalized understanding.  Neck pain Afebrile, nontoxic-appearing, NAD. VSS. DDX includes but not limited to: fracture, contusion, dislocation, strain.  Imaging negative for fracture. Suspect strain. Strict ED precautions were given and patient verbalized understanding.  Acute pain of right shoulder Afebrile, nontoxic-appearing, NAD. VSS. DDX includes but not limited to: fracture, contusion, dislocation, sprain Imaging negative for fracture. Reports hardware had moved prior, but unable to see prior imaging. Lidocaine  5%  patch every 12 hours PRN was prescribed for pain.  Strict ED precautions were given and patient verbalized understanding.  Right flank pain Afebrile, nontoxic-appearing, NAD. VSS. DDX includes but not limited to: fracture, contusion, dislocation, sprain Reports pain at right lateral flank. TTP overlying right lower ribs. Imaging negative for fracture. Lidocaine  5% patch every 12 hours PRN was prescribed for pain.  Strict ED precautions were given and patient verbalized understanding.    ED Discharge Orders          Ordered    ofloxacin  (FLOXIN ) 0.3 % OTIC solution  Daily        02/07/24 1418    lidocaine  (LIDODERM ) 5 %  Every 24 hours        02/07/24 1457             I have reviewed the PDMP during this encounter.       [1]  Allergies Allergen Reactions   Infed [Iron Dextran] Other (See Comments)    Decreased BP    Vicodin [Hydrocodone -Acetaminophen ] Other (See Comments)    Decreased BP   Hydrocodone     Diclofenac Other (See Comments)    Caused hypotension  [2]  Social History Tobacco Use   Smoking status: Former    Current packs/day: 0.00    Types: Cigarettes    Quit date: 06/19/1990    Years since quitting: 33.6   Smokeless  tobacco: Never   Tobacco comments:    rarely cigar  Vaping Use   Vaping status: Never Used  Substance Use Topics   Alcohol use: Not Currently    Alcohol/week: 1.0 standard drink of alcohol    Types: 1 Cans of beer per week    Comment: occasional beer   Drug use: Not Currently    Types: Marijuana    Comment: Last use several yrs ago     Basilia Ulanda SQUIBB, PA-C 02/07/24 1505  "

## 2024-02-07 NOTE — Discharge Instructions (Addendum)
 Your x-rays showed no broken bones. They were sent to a radiologist for further evaluation and we will call you if the radiologist sees anything different.   I sent lidocaine  patches to your pharmacy. Please use as directed.   I recommend you follow up with your orthopedic or PCP next week for recheck and further imaging as directed.   It is very important for you to pay attention to any new symptoms or worsening of your current condition. Please go directly to the Emergency Department immediately should you begin to feel worse in any way or have any of the following symptoms: fevers, increased pain, increased swelling, increased redness, or color changes.   You have been prescribed ofloxacin  for your ears. This is an antibiotic often used to treat ear infections. Please take as directed.

## 2024-02-08 ENCOUNTER — Other Ambulatory Visit: Payer: Self-pay

## 2024-02-08 ENCOUNTER — Emergency Department (HOSPITAL_COMMUNITY)

## 2024-02-08 ENCOUNTER — Inpatient Hospital Stay (HOSPITAL_COMMUNITY)
Admission: EM | Admit: 2024-02-08 | Discharge: 2024-03-03 | DRG: 853 | Disposition: A | Discharge status: Rehabilitation Facility | Attending: Family Medicine | Admitting: Family Medicine

## 2024-02-08 ENCOUNTER — Encounter (HOSPITAL_COMMUNITY): Payer: Self-pay

## 2024-02-08 DIAGNOSIS — I330 Acute and subacute infective endocarditis: Secondary | ICD-10-CM

## 2024-02-08 DIAGNOSIS — Z9842 Cataract extraction status, left eye: Secondary | ICD-10-CM

## 2024-02-08 DIAGNOSIS — E1140 Type 2 diabetes mellitus with diabetic neuropathy, unspecified: Secondary | ICD-10-CM | POA: Diagnosis present

## 2024-02-08 DIAGNOSIS — Z531 Procedure and treatment not carried out because of patient's decision for reasons of belief and group pressure: Secondary | ICD-10-CM | POA: Diagnosis present

## 2024-02-08 DIAGNOSIS — R531 Weakness: Secondary | ICD-10-CM | POA: Diagnosis present

## 2024-02-08 DIAGNOSIS — E1122 Type 2 diabetes mellitus with diabetic chronic kidney disease: Secondary | ICD-10-CM | POA: Diagnosis present

## 2024-02-08 DIAGNOSIS — M1712 Unilateral primary osteoarthritis, left knee: Secondary | ICD-10-CM | POA: Diagnosis present

## 2024-02-08 DIAGNOSIS — A419 Sepsis, unspecified organism: Principal | ICD-10-CM | POA: Diagnosis present

## 2024-02-08 DIAGNOSIS — Z7902 Long term (current) use of antithrombotics/antiplatelets: Secondary | ICD-10-CM

## 2024-02-08 DIAGNOSIS — H7493 Unspecified disorder of middle ear and mastoid, bilateral: Secondary | ICD-10-CM | POA: Diagnosis present

## 2024-02-08 DIAGNOSIS — Z751 Person awaiting admission to adequate facility elsewhere: Secondary | ICD-10-CM

## 2024-02-08 DIAGNOSIS — Z8711 Personal history of peptic ulcer disease: Secondary | ICD-10-CM

## 2024-02-08 DIAGNOSIS — Z8049 Family history of malignant neoplasm of other genital organs: Secondary | ICD-10-CM

## 2024-02-08 DIAGNOSIS — M25511 Pain in right shoulder: Secondary | ICD-10-CM | POA: Diagnosis present

## 2024-02-08 DIAGNOSIS — E8889 Other specified metabolic disorders: Secondary | ICD-10-CM | POA: Diagnosis present

## 2024-02-08 DIAGNOSIS — Z8269 Family history of other diseases of the musculoskeletal system and connective tissue: Secondary | ICD-10-CM

## 2024-02-08 DIAGNOSIS — Z96642 Presence of left artificial hip joint: Secondary | ICD-10-CM | POA: Diagnosis present

## 2024-02-08 DIAGNOSIS — N2581 Secondary hyperparathyroidism of renal origin: Secondary | ICD-10-CM | POA: Diagnosis present

## 2024-02-08 DIAGNOSIS — E871 Hypo-osmolality and hyponatremia: Secondary | ICD-10-CM | POA: Diagnosis present

## 2024-02-08 DIAGNOSIS — R197 Diarrhea, unspecified: Secondary | ICD-10-CM | POA: Diagnosis present

## 2024-02-08 DIAGNOSIS — R296 Repeated falls: Secondary | ICD-10-CM | POA: Diagnosis present

## 2024-02-08 DIAGNOSIS — Z8249 Family history of ischemic heart disease and other diseases of the circulatory system: Secondary | ICD-10-CM

## 2024-02-08 DIAGNOSIS — Z9862 Peripheral vascular angioplasty status: Secondary | ICD-10-CM

## 2024-02-08 DIAGNOSIS — H93A2 Pulsatile tinnitus, left ear: Secondary | ICD-10-CM | POA: Diagnosis present

## 2024-02-08 DIAGNOSIS — K219 Gastro-esophageal reflux disease without esophagitis: Secondary | ICD-10-CM | POA: Diagnosis present

## 2024-02-08 DIAGNOSIS — Z888 Allergy status to other drugs, medicaments and biological substances status: Secondary | ICD-10-CM

## 2024-02-08 DIAGNOSIS — Z96611 Presence of right artificial shoulder joint: Secondary | ICD-10-CM | POA: Diagnosis present

## 2024-02-08 DIAGNOSIS — Z833 Family history of diabetes mellitus: Secondary | ICD-10-CM

## 2024-02-08 DIAGNOSIS — I10 Essential (primary) hypertension: Secondary | ICD-10-CM | POA: Diagnosis present

## 2024-02-08 DIAGNOSIS — M009 Pyogenic arthritis, unspecified: Secondary | ICD-10-CM | POA: Diagnosis present

## 2024-02-08 DIAGNOSIS — Z981 Arthrodesis status: Secondary | ICD-10-CM

## 2024-02-08 DIAGNOSIS — Z992 Dependence on renal dialysis: Secondary | ICD-10-CM

## 2024-02-08 DIAGNOSIS — W19XXXA Unspecified fall, initial encounter: Secondary | ICD-10-CM | POA: Diagnosis present

## 2024-02-08 DIAGNOSIS — Z1152 Encounter for screening for COVID-19: Secondary | ICD-10-CM

## 2024-02-08 DIAGNOSIS — Y92009 Unspecified place in unspecified non-institutional (private) residence as the place of occurrence of the external cause: Secondary | ICD-10-CM

## 2024-02-08 DIAGNOSIS — Z993 Dependence on wheelchair: Secondary | ICD-10-CM

## 2024-02-08 DIAGNOSIS — F32A Depression, unspecified: Secondary | ICD-10-CM | POA: Diagnosis present

## 2024-02-08 DIAGNOSIS — E11649 Type 2 diabetes mellitus with hypoglycemia without coma: Secondary | ICD-10-CM | POA: Diagnosis not present

## 2024-02-08 DIAGNOSIS — L0291 Cutaneous abscess, unspecified: Secondary | ICD-10-CM

## 2024-02-08 DIAGNOSIS — Z9841 Cataract extraction status, right eye: Secondary | ICD-10-CM

## 2024-02-08 DIAGNOSIS — E785 Hyperlipidemia, unspecified: Secondary | ICD-10-CM | POA: Diagnosis present

## 2024-02-08 DIAGNOSIS — H66012 Acute suppurative otitis media with spontaneous rupture of ear drum, left ear: Principal | ICD-10-CM | POA: Diagnosis present

## 2024-02-08 DIAGNOSIS — M4802 Spinal stenosis, cervical region: Secondary | ICD-10-CM | POA: Diagnosis present

## 2024-02-08 DIAGNOSIS — E1165 Type 2 diabetes mellitus with hyperglycemia: Secondary | ICD-10-CM | POA: Diagnosis present

## 2024-02-08 DIAGNOSIS — S14104S Unspecified injury at C4 level of cervical spinal cord, sequela: Secondary | ICD-10-CM

## 2024-02-08 DIAGNOSIS — M48061 Spinal stenosis, lumbar region without neurogenic claudication: Secondary | ICD-10-CM | POA: Diagnosis present

## 2024-02-08 DIAGNOSIS — D62 Acute posthemorrhagic anemia: Secondary | ICD-10-CM | POA: Diagnosis not present

## 2024-02-08 DIAGNOSIS — R29898 Other symptoms and signs involving the musculoskeletal system: Secondary | ICD-10-CM

## 2024-02-08 DIAGNOSIS — Z87891 Personal history of nicotine dependence: Secondary | ICD-10-CM

## 2024-02-08 DIAGNOSIS — S0990XA Unspecified injury of head, initial encounter: Secondary | ICD-10-CM | POA: Diagnosis present

## 2024-02-08 DIAGNOSIS — I120 Hypertensive chronic kidney disease with stage 5 chronic kidney disease or end stage renal disease: Secondary | ICD-10-CM | POA: Diagnosis present

## 2024-02-08 DIAGNOSIS — M65862 Other synovitis and tenosynovitis, left lower leg: Secondary | ICD-10-CM | POA: Diagnosis present

## 2024-02-08 DIAGNOSIS — X58XXXS Exposure to other specified factors, sequela: Secondary | ICD-10-CM | POA: Diagnosis present

## 2024-02-08 DIAGNOSIS — R5381 Other malaise: Secondary | ICD-10-CM | POA: Diagnosis present

## 2024-02-08 DIAGNOSIS — Y831 Surgical operation with implant of artificial internal device as the cause of abnormal reaction of the patient, or of later complication, without mention of misadventure at the time of the procedure: Secondary | ICD-10-CM | POA: Diagnosis present

## 2024-02-08 DIAGNOSIS — N186 End stage renal disease: Secondary | ICD-10-CM | POA: Diagnosis present

## 2024-02-08 DIAGNOSIS — I9589 Other hypotension: Secondary | ICD-10-CM | POA: Diagnosis present

## 2024-02-08 DIAGNOSIS — Z79899 Other long term (current) drug therapy: Secondary | ICD-10-CM

## 2024-02-08 DIAGNOSIS — E1151 Type 2 diabetes mellitus with diabetic peripheral angiopathy without gangrene: Secondary | ICD-10-CM | POA: Diagnosis present

## 2024-02-08 DIAGNOSIS — H902 Conductive hearing loss, unspecified: Secondary | ICD-10-CM

## 2024-02-08 DIAGNOSIS — Z905 Acquired absence of kidney: Secondary | ICD-10-CM

## 2024-02-08 DIAGNOSIS — Z7682 Awaiting organ transplant status: Secondary | ICD-10-CM

## 2024-02-08 DIAGNOSIS — Z885 Allergy status to narcotic agent status: Secondary | ICD-10-CM

## 2024-02-08 DIAGNOSIS — M419 Scoliosis, unspecified: Secondary | ICD-10-CM | POA: Diagnosis present

## 2024-02-08 DIAGNOSIS — T8459XA Infection and inflammatory reaction due to other internal joint prosthesis, initial encounter: Secondary | ICD-10-CM | POA: Diagnosis present

## 2024-02-08 DIAGNOSIS — Z634 Disappearance and death of family member: Secondary | ICD-10-CM

## 2024-02-08 DIAGNOSIS — D631 Anemia in chronic kidney disease: Secondary | ICD-10-CM | POA: Diagnosis present

## 2024-02-08 DIAGNOSIS — Z7982 Long term (current) use of aspirin: Secondary | ICD-10-CM

## 2024-02-08 DIAGNOSIS — Z823 Family history of stroke: Secondary | ICD-10-CM

## 2024-02-08 DIAGNOSIS — Z8673 Personal history of transient ischemic attack (TIA), and cerebral infarction without residual deficits: Secondary | ICD-10-CM

## 2024-02-08 DIAGNOSIS — E875 Hyperkalemia: Secondary | ICD-10-CM | POA: Diagnosis present

## 2024-02-08 DIAGNOSIS — Z961 Presence of intraocular lens: Secondary | ICD-10-CM | POA: Diagnosis present

## 2024-02-08 DIAGNOSIS — Z85528 Personal history of other malignant neoplasm of kidney: Secondary | ICD-10-CM

## 2024-02-08 DIAGNOSIS — G992 Myelopathy in diseases classified elsewhere: Secondary | ICD-10-CM | POA: Diagnosis present

## 2024-02-08 DIAGNOSIS — G4733 Obstructive sleep apnea (adult) (pediatric): Secondary | ICD-10-CM | POA: Diagnosis present

## 2024-02-08 LAB — BASIC METABOLIC PANEL WITH GFR
Anion gap: 18 — ABNORMAL HIGH (ref 5–15)
BUN: 48 mg/dL — ABNORMAL HIGH (ref 8–23)
CO2: 28 mmol/L (ref 22–32)
Calcium: 10.8 mg/dL — ABNORMAL HIGH (ref 8.9–10.3)
Chloride: 89 mmol/L — ABNORMAL LOW (ref 98–111)
Creatinine, Ser: 7.8 mg/dL — ABNORMAL HIGH (ref 0.61–1.24)
GFR, Estimated: 7 mL/min — ABNORMAL LOW
Glucose, Bld: 101 mg/dL — ABNORMAL HIGH (ref 70–99)
Potassium: 5 mmol/L (ref 3.5–5.1)
Sodium: 135 mmol/L (ref 135–145)

## 2024-02-08 LAB — RESP PANEL BY RT-PCR (RSV, FLU A&B, COVID)  RVPGX2
Influenza A by PCR: NEGATIVE
Influenza B by PCR: NEGATIVE
Resp Syncytial Virus by PCR: NEGATIVE
SARS Coronavirus 2 by RT PCR: NEGATIVE

## 2024-02-08 LAB — CBC WITH DIFFERENTIAL/PLATELET
Abs Immature Granulocytes: 0.08 K/uL — ABNORMAL HIGH (ref 0.00–0.07)
Basophils Absolute: 0.1 K/uL (ref 0.0–0.1)
Basophils Relative: 1 %
Eosinophils Absolute: 0 K/uL (ref 0.0–0.5)
Eosinophils Relative: 0 %
HCT: 35.3 % — ABNORMAL LOW (ref 39.0–52.0)
Hemoglobin: 11.4 g/dL — ABNORMAL LOW (ref 13.0–17.0)
Immature Granulocytes: 1 %
Lymphocytes Relative: 4 %
Lymphs Abs: 0.5 K/uL — ABNORMAL LOW (ref 0.7–4.0)
MCH: 29.6 pg (ref 26.0–34.0)
MCHC: 32.3 g/dL (ref 30.0–36.0)
MCV: 91.7 fL (ref 80.0–100.0)
Monocytes Absolute: 1.6 K/uL — ABNORMAL HIGH (ref 0.1–1.0)
Monocytes Relative: 14 %
Neutro Abs: 9.4 K/uL — ABNORMAL HIGH (ref 1.7–7.7)
Neutrophils Relative %: 80 %
Platelets: 223 K/uL (ref 150–400)
RBC: 3.85 MIL/uL — ABNORMAL LOW (ref 4.22–5.81)
RDW: 16 % — ABNORMAL HIGH (ref 11.5–15.5)
WBC: 11.7 K/uL — ABNORMAL HIGH (ref 4.0–10.5)
nRBC: 0 % (ref 0.0–0.2)

## 2024-02-08 MED ORDER — LIDOCAINE 5 % EX PTCH
1.0000 | MEDICATED_PATCH | CUTANEOUS | Status: DC
  Administered 2024-02-08 – 2024-02-11 (×4): 1 via TRANSDERMAL
  Filled 2024-02-08 (×2): qty 1

## 2024-02-08 MED ORDER — CIPROFLOXACIN-DEXAMETHASONE 0.3-0.1 % OT SUSP
4.0000 [drp] | Freq: Two times a day (BID) | OTIC | Status: AC
  Administered 2024-02-08 – 2024-02-18 (×19): 4 [drp] via OTIC
  Filled 2024-02-08 (×3): qty 7.5

## 2024-02-08 MED ORDER — ACETAMINOPHEN 325 MG PO TABS
650.0000 mg | ORAL_TABLET | Freq: Once | ORAL | Status: AC
  Administered 2024-02-08: 650 mg via ORAL
  Filled 2024-02-08: qty 2

## 2024-02-08 MED ORDER — AMOXICILLIN-POT CLAVULANATE 875-125 MG PO TABS: 1.0000 | ORAL_TABLET | Freq: Two times a day (BID) | ORAL | 0 refills | Status: DC

## 2024-02-08 MED ORDER — PIPERACILLIN-TAZOBACTAM 3.375 G IVPB 30 MIN
3.3750 g | Freq: Once | INTRAVENOUS | Status: AC
  Administered 2024-02-08: 3.375 g via INTRAVENOUS
  Filled 2024-02-08: qty 50

## 2024-02-08 MED ORDER — FENTANYL CITRATE (PF) 50 MCG/ML IJ SOSY: 50.0000 ug | PREFILLED_SYRINGE | Freq: Once | INTRAMUSCULAR | Status: DC

## 2024-02-08 MED ORDER — AMOXICILLIN-POT CLAVULANATE 875-125 MG PO TABS
1.0000 | ORAL_TABLET | Freq: Two times a day (BID) | ORAL | Status: DC
  Administered 2024-02-08: 1 via ORAL
  Filled 2024-02-08: qty 1

## 2024-02-08 MED ORDER — PSYLLIUM 95 % PO PACK
1.0000 | PACK | Freq: Every day | ORAL | Status: DC
  Administered 2024-02-08 – 2024-03-03 (×20): 1 via ORAL
  Filled 2024-02-08 (×19): qty 1

## 2024-02-08 MED ORDER — OXYCODONE HCL 5 MG PO TABS
5.0000 mg | ORAL_TABLET | Freq: Once | ORAL | Status: AC
  Administered 2024-02-08: 5 mg via ORAL
  Filled 2024-02-08: qty 1

## 2024-02-08 NOTE — H&P (Signed)
 " History and Physical    Patient: Sovereign Ramiro FMW:[id number] DOB: [birth year] DOA: 02/08/2024 DOS: the patient was seen and examined on 02/08/2024 PCP: [name], MD  Patient coming from: {Point_of_Origin:[address]}  Chief Complaint:  Chief Complaint  Patient presents with   Gait Problem   HPI: Jaran Sainz Sr. is a 63 y.o. male with medical history significant of ***  Review of Systems: {ROS_Text:26778} 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    Parathyroid  abnormality    ectopic parathyroid  gland   Presence of arteriovenous fistula for hemodialysis, primary    RUE PER PT RLE   Refusal of blood product    NO WHOLE BLOOD PROUCTS   Renal cell carcinoma (HCC)    s/p hand assisted laparoscopic bilateral nephrectomies 11/29/17, + RCC left   Secondary hyperparathyroidism    Seizures (HCC)    one episode in past, due to elevated Potassium 08/02/20- at least 4 years ago   Sleep apnea    doesn't use CPAP anymore since weight loss   Stroke [facility])    no residual   Past Surgical History:  Procedure Laterality Date   ABDOMINAL AORTOGRAM W/LOWER EXTREMITY N/A 12/09/2023   Procedure: ABDOMINAL AORTOGRAM W/LOWER EXTREMITY;  Surgeon: Sheree Penne Bruckner, MD;  Location: [facility] INVASIVE CV LAB;  Service: Cardiovascular;  Laterality: N/A;   ANTERIOR CERVICAL CORPECTOMY N/A 05/25/2023   Procedure: ANTERIOR CERVICAL CORPECTOMY CERVICAL FOUR, CERVICAL  THREE - CERVICAL FIVE FUSION;  Surgeon: [name] Shove, MD;  Location: [facility] OR;  Service: Neurosurgery;  Laterality: N/A;   AV FISTULA PLACEMENT Right    right arm   BIOPSY  08/01/2019   Procedure: BIOPSY;  Surgeon: Kristie Lamprey, MD;  Location: [name] ENDOSCOPY;  Service: Endoscopy;;   BIOPSY  11/18/2020   Procedure: BIOPSY;  Surgeon: Stacia Glendia BRAVO, MD;  Location: [facility] ENDOSCOPY;  Service: Gastroenterology;;   BIOPSY  09/13/2021   Procedure: BIOPSY;  Surgeon: Wilhelmenia Aloha Raddle., MD;  Location: [facility] ENDOSCOPY;  Service: Gastroenterology;;   CATARACT EXTRACTION W/ INTRAOCULAR LENS  IMPLANT, BILATERAL     COLON SURGERY     COLONOSCOPY N/A 08/04/2015   Procedure: COLONOSCOPY;  Surgeon: Gordy CHRISTELLA Starch, MD;  Location: [facility] ENDOSCOPY;  Service: Endoscopy;  Laterality: N/A;   COLONOSCOPY  2017   JMP@ [facility]-good prep-mass -recall 1 yr   COLONOSCOPY N/A 09/13/2021   Procedure: COLONOSCOPY;  Surgeon: Mansouraty, Aloha Raddle., MD;  Location: [facility] ENDOSCOPY;  Service: Gastroenterology;  Laterality: N/A;   COLONOSCOPY WITH PROPOFOL  N/A 11/25/2020   Procedure: COLONOSCOPY WITH PROPOFOL ;  Surgeon: Federico Rosario BROCKS, MD;  Location: [facility] ENDOSCOPY;  Service: Gastroenterology;  Laterality: N/A;   COLONOSCOPY WITH PROPOFOL  N/A 09/23/2021   Procedure: COLONOSCOPY WITH PROPOFOL ;  Surgeon: San Sandor GAILS, DO;  Location: [facility] ENDOSCOPY;  Service: Gastroenterology;  Laterality: N/A;   ENTEROSCOPY N/A 09/23/2021   Procedure: ENTEROSCOPY;  Surgeon: San Sandor GAILS, DO;  Location: [facility] ENDOSCOPY;  Service: Gastroenterology;  Laterality: N/A;  Will place order for video capsule study as we may opt to place it during procedure   ESOPHAGOGASTRODUODENOSCOPY N/A 08/01/2019   Procedure: ESOPHAGOGASTRODUODENOSCOPY (EGD);  Surgeon: Kristie Lamprey, MD;  Location: [facility] ENDOSCOPY;  Service: Endoscopy;  Laterality: N/A;   ESOPHAGOGASTRODUODENOSCOPY N/A 11/18/2020   Procedure: ESOPHAGOGASTRODUODENOSCOPY (EGD);  Surgeon: Stacia Glendia BRAVO, MD;   Location: [facility] ENDOSCOPY;  Service: Gastroenterology;  Laterality: N/A;   ESOPHAGOGASTRODUODENOSCOPY (EGD) WITH PROPOFOL  N/A 08/04/2019   Procedure: ESOPHAGOGASTRODUODENOSCOPY (EGD) WITH PROPOFOL ;  Surgeon: Leigh Elspeth SQUIBB, MD;  Location: [facility] ENDOSCOPY;  Service: Gastroenterology;  Laterality: N/A;   ESOPHAGOGASTRODUODENOSCOPY (EGD) WITH PROPOFOL  N/A 09/13/2021   Procedure: ESOPHAGOGASTRODUODENOSCOPY (EGD) WITH PROPOFOL ;  Surgeon: Wilhelmenia Aloha Raddle., MD;  Location: [facility] ENDOSCOPY;  Service: Gastroenterology;  Laterality: N/A;   GIVENS CAPSULE STUDY N/A 09/23/2021   Procedure: GIVENS CAPSULE STUDY;  Surgeon: San Sandor GAILS, DO;  Location: [facility] ENDOSCOPY;  Service: Gastroenterology;  Laterality: N/A;   graft left arm Left    for dialysis x 2. Removed   HOT HEMOSTASIS  11/18/2020   Procedure: HOT HEMOSTASIS (ARGON PLASMA COAGULATION/BICAP);  Surgeon: Stacia Glendia BRAVO, MD;  Location: [facility] ENDOSCOPY;  Service: Gastroenterology;;   HOT HEMOSTASIS N/A 09/23/2021   Procedure: HOT HEMOSTASIS (ARGON PLASMA COAGULATION/BICAP);  Surgeon: San Sandor GAILS, DO;  Location: [facility] ENDOSCOPY;  Service: Gastroenterology;  Laterality: N/A;   INSERTION OF DIALYSIS CATHETER     Rt chest   LAPAROSCOPIC RIGHT COLECTOMY N/A 08/05/2015   Procedure: LAPAROSCOPIC RIGHT COLECTOMY- ASCENDING;  Surgeon: Jina Nephew, MD;  Location: [facility] OR;  Service: General;  Laterality: N/A;   LOWER EXTREMITY ANGIOGRAPHY N/A 12/09/2023   Procedure: Lower Extremity Angiography;  Surgeon: Sheree Penne Bruckner, MD;  Location: [facility] INVASIVE CV LAB;  Service: Cardiovascular;  Laterality: N/A;   LOWER EXTREMITY INTERVENTION N/A 12/09/2023   Procedure: LOWER EXTREMITY INTERVENTION;  Surgeon: Sheree Penne Bruckner, MD;  Location: [facility] INVASIVE CV LAB;  Service: Cardiovascular;  Laterality: N/A;   MASS EXCISION Left 05/28/2019   Procedure: EXCISION SOFT TISSUE MASS LEFT SHOULDER;  Surgeon: Eletha Boas, MD;  Location: [facility] ORS;  Service:  General;  Laterality: Left;   NEPHRECTOMY Bilateral    PARATHYROIDECTOMY N/A 06/12/2016   Procedure: TOTAL PARATHYROIDECTOMY WITH AUTOTRANSPLANTATION TO LEFT FOREARM;  Surgeon: Boas Eletha, MD;  Location: [facility] OR;  Service: General;  Laterality: N/A;   PARATHYROIDECTOMY N/A 10/23/2016   Procedure: PARATHYROIDECTOMY;  Surgeon: Eletha Boas, MD;  Location: [facility] OR;  Service: General;  Laterality: N/A;   REVERSE SHOULDER ARTHROPLASTY Right 08/24/2020   Procedure: REVERSE SHOULDER ARTHROPLASTY;  Surgeon: Cristy Bonner DASEN, MD;  Location: [facility] OR;  Service: Orthopedics;  Laterality: Right;   REVISON OF ARTERIOVENOUS FISTULA Right 07/16/2017   Procedure: REVISION OF ARTERIOVENOUS FISTULA  Right ARM;  Surgeon: Sheree Penne Bruckner, MD;  Location: [facility] OR;  Service: Vascular;  Laterality: Right;   TOTAL HIP ARTHROPLASTY Left 11/14/2020   Procedure: LEFT TOTAL HIP ARTHROPLASTY ANTERIOR APPROACH;  Surgeon: [name] Kay HERO, MD;  Location: [facility] OR;  Service: Orthopedics;  Laterality: Left;   UPPER GASTROINTESTINAL ENDOSCOPY  2021   @ [facility]   Social History:  reports that he quit smoking about 33 years ago. His smoking use included cigarettes. He has never used smokeless tobacco. He reports that he does not currently use alcohol after a past usage of about 1.0 standard drink  of alcohol per week. He reports that he does not currently use drugs after having used the following drugs: Marijuana.  Allergies[1]  Family History  Problem Relation Age of Onset   Diabetes Father    Stroke Father    Hypertension Father    Uterine cancer Mother    Lupus Sister    Stroke Sister    Hypertension Sister    Anuerysm Brother        brain   Colon cancer Neg Hx    Esophageal cancer Neg Hx    Stomach cancer Neg Hx    Pancreatic cancer Neg Hx    Liver disease Neg Hx    Colon polyps Neg Hx    Rectal cancer Neg Hx     Prior to Admission medications  Medication Sig Start Date End Date Taking? Authorizing Provider   amoxicillin -clavulanate (AUGMENTIN ) 875-125 MG tablet Take 1 tablet by mouth every 12 (twelve) hours for 10 days. 02/08/24 02/18/24 Yes Guillermina Hamilton, MD  acetaminophen  (TYLENOL ) 325 MG tablet Take 1-2 tablets (325-650 mg total) by mouth every 4 (four) hours as needed for mild pain (pain score 1-3). 07/14/23   Love, Sharlet RAMAN, PA-C  amoxicillin  (AMOXIL ) 500 MG capsule TAKE FOUR CAPSULES BY MOUTH ONE HOUR BEFORE DENTAL WORK/PROCEDURE 01/06/24   [name] Kay HERO, MD  atorvastatin  (LIPITOR) 10 MG tablet Take 1 tablet (10 mg total) by mouth every Thursday. 05/30/23   Lue Elsie BROCKS, MD  augmented betamethasone  dipropionate (DIPROLENE -AF) 0.05 % ointment Apply 1 Application topically 2 (two) times daily as needed for rash. 08/28/21   [provider]  calcitRIOL  (ROCALTROL ) 0.5 MCG capsule Take 3 capsules (1.5 mcg total) by mouth every Monday, Wednesday, and Friday with hemodialysis. 07/15/23   Love, Sharlet RAMAN, PA-C  camphor-menthol  [facility]) lotion Apply topically. 02/03/21   [provider]  cinacalcet  (SENSIPAR ) 60 MG tablet     [provider]  clopidogrel  (PLAVIX ) 75 MG tablet Take 1 tablet (75 mg total) by mouth daily. 12/09/23 12/08/24  Sheree Penne Bruckner, MD  cromolyn  (OPTICROM ) 4 % ophthalmic solution Place 1 drop into both eyes daily as needed (redness).    [provider]  cyclobenzaprine  (FLEXERIL ) 10 MG tablet Take 1 tablet (10 mg total) by mouth 3 (three) times daily as needed for muscle spasms. 05/30/23   Lue Elsie BROCKS, MD  diphenhydrAMINE  (BENADRYL ) 25 mg capsule Take 1 capsule (25 mg total) by mouth every 6 (six) hours as needed for itching. 07/14/23   Love, Sharlet RAMAN, PA-C  gabapentin  (NEURONTIN ) 100 MG capsule Take 3 capsules (300 mg total) by mouth at bedtime. 05/30/23   Lue Elsie BROCKS, MD  ibuprofen (ADVIL) 800 MG tablet SMARTSIG:1 Tablet(s) By Mouth Every 12 Hours PRN 02/15/23   [provider]  lidocaine  (LIDODERM ) 5 % Place 1 patch  onto the skin daily. Remove & Discard patch within 12 hours 02/07/24   Hermanns, Ashlee P, PA-C  LOKELMA  10 g PACK packet Take 1 packet by mouth daily. 05/16/21   [provider]  melatonin 5 MG TABS Take 1 tablet (5 mg total) by mouth at bedtime as needed. 07/14/23   Love, Sharlet RAMAN, PA-C  Methoxy PEG-Epoetin  Beta (MIRCERA IJ) 75 mcg. 07/22/23 07/20/24  [provider]  midodrine  (PROAMATINE ) 10 MG tablet Take 1 tablet (10 mg total) by mouth every dialysis (30 min before each dialysis.). 11/15/23   Lovorn, Megan, MD  Nystatin (GERHARDT'S BUTT CREAM) CREA Apply 1 Application topically 2 (two) times daily. 07/15/23  Love, Pamela S, PA-C  ofloxacin  (FLOXIN ) 0.3 % OTIC solution Place 10 drops into the left ear daily for 7 days. 02/07/24 02/14/24  Hermanns, Ashlee P, PA-C  oxyCODONE -acetaminophen  (PERCOCET/ROXICET) 5-325 MG tablet Take 1-2 tablets by mouth every 6 (six) hours as needed for severe pain (pain score 7-10) (5mg  for moderate pain and 10 mg for severe pain). 07/15/23   Love, Sharlet RAMAN, PA-C  pantoprazole  (PROTONIX ) 40 MG tablet Take 1 tablet (40 mg total) by mouth 2 (two) times daily before a meal. Patient taking differently: Take 40 mg by mouth daily as needed (for acid reflux). 09/13/21   Singh, Prashant K, MD  pentafluoroprop-tetrafluoroeth JUANA) AERO Apply 1 Application topically as needed (topical anesthesia for hemodialysis). 07/15/23   Love, Sharlet RAMAN, PA-C  polyethylene glycol powder (GLYCOLAX /MIRALAX ) 17 GM/SCOOP powder Take 17 g by mouth 2 (two) times daily as needed for mild constipation. 07/14/23   Love, Sharlet RAMAN, PA-C  predniSONE  (DELTASONE ) 10 MG tablet Take by mouth. 11/07/23   [provider]  psyllium (HYDROCIL/METAMUCIL) 95 % PACK Take 1 packet by mouth 3 (three) times daily. 07/15/23   Love, Sharlet RAMAN, PA-C  senna-docusate (SENOKOT-S) 8.6-50 MG tablet Take 2 tablets by mouth 2 (two) times daily. 05/30/23   Lue Elsie BROCKS, MD  sevelamer  carbonate (RENVELA ) 800 MG  tablet Take 3,200-4,000 mg by mouth 3 (three) times daily with meals. 06/12/21   [provider]  topiramate  (TOPAMAX ) 25 MG tablet Take 1 tablet (25 mg total) by mouth 2 (two) times daily. 07/15/23   Maurice Sharlet RAMAN, PA-C    Physical Exam: Vitals:   02/08/24 1530 02/08/24 1534 02/08/24 2102  BP: 93/66  101/68  Pulse:  93 93  Resp:  18 19  Temp:  99.9 F (37.7 C) 98.9 F (37.2 C)  TempSrc:  Oral Oral  SpO2:  100% 100%  Gen: *** Pulm: ***  CV: *** GI: Soft, NT, ND, +BS *** Neuro: Alert and oriented***. No new focal deficits. Ext: Warm, no deformities*** Skin: ***. ***o rashes, lesions or ulcers on visualized skin   Data Reviewed: {Tip this will not be part of the note when signed- Document your independent interpretation of telemetry tracing, EKG, lab, Radiology test or any other diagnostic tests. Add any new diagnostic test ordered today. (Optional):26781} {Results:26384}  Assessment and Plan: No notes have been filed under this hospital service. Service: Hospitalist     Advance Care Planning:   Code Status: Prior ***  Consults: ***  Family Communication: ***  Severity of Illness: {Observation/Inpatient:21159}  Author: Bernardino KATHEE Come, MD 02/08/2024 11:50 PM  For on call review [email or web address].     [1]  Allergies Allergen Reactions   Infed [Iron Dextran] Other (See Comments)    Decreased BP    Vicodin [Hydrocodone -Acetaminophen ] Other (See Comments)    Decreased BP   Hydrocodone     Diclofenac Other (See Comments)    Caused hypotension   "

## 2024-02-08 NOTE — ED Provider Notes (Signed)
 " [facility] Provider Note   HPI/ROS    History obtained from patient son.  Carl Franks Sr. is a 63 y.o. 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  Patient presents today after a fall several days ago.  Denies any loss consciousness during the event.  Also states he was seen yesterday at an urgent care and diagnosed with an ear infection.  States he has been feeling off balance over the last several days.  Son states that yesterday he suddenly became weak and was unable to ambulate at home.  He states yesterday he was completely unable to move his right leg for some time, but today is able to move it somewhat.  Denies any fevers, chills, chest pain, shortness breath, nausea, vomiting.  Does endorse several episodes of diarrhea since this morning has not missed any dialysis.  MDM   I have reviewed the nursing documentation, vital signs, as well as the past medical history, surgical history, family history, and social history.  Initial Assessment:  Patient hemodynamically stable on initial evaluation but mildly febrile to 100.5.  No fevers here today without cough, congestion, vomiting, or diarrhea.  Likely secondary to otitis media on the left.  Concern at this time for worsening of cervical injury from prior surgery.  Given deficits, will obtain MRI brain and MRI C-spine.  Noncon  head CT and hip x-rays obtained largely unremarkable.  COVID flu here negative.  CBC with mild leukocytosis with left shift.  BMP with no significant electrolyte abnormalities, with stable creatinine.  MRI head with no acute findings.  Agree with radiology.  MRI C-spine with no interval change in prior operation without obvious etiology of his weakness today.  Does have prior left-sided strokes, but no signs of recurrent stroke or recrudescence that would explain the symptoms.  Will touch base with neurosurgery regarding weakness.  Will also give a dose of Metamucil for diarrhea.  Neurosurgery with no acute intervention at this time.  They will follow the patient when he is admitted.  Will plan for admission given patient currently unable to walk and is requiring two-person assist even just to get to the bathroom.  Patient given a dose of oxycodone  for pain.  Will also give a dose of Zosyn  here given concerns for worsening ear infection.  Will prescribe Cipro  drops while inpatient as well.  Disposition:  I discussed the case with Dr.Grunz who graciously agreed to admit the patient to their service for continued care.   This patient was staffed with Dr. Patt who supervised the visit and agreed with the plan of care.   Due to the patients current presenting symptoms, physical exam findings, and the workup stated above, it is thought that the etiology of the patients current presentation is:  1. Acute suppurative otitis media of left ear with spontaneous rupture of tympanic membrane, recurrence not specified    Clinical Complexity A medically appropriate history, review of systems, and physical exam was performed.  Factors that affect the complexity of this encounter: assessment of correct  protocol, laboratory work from this visit, and notes from other physicians (urgent care notes)  My independent interpretations of diagnostic studies are documented in the ED course above.   If decision rules  were used in this patient's evaluation, they are listed below.   Click here for ABCD2, HEART and other calculators  Patient's presentation is most consistent with acute complicated illness / injury requiring diagnostic workup.  MDM generated using voice dictation software and may contain dictation errors. Please contact me for any clarification or with any questions.    Physical Exam, PMH, PSH, Family History, and Social Hsitory   Vitals:   02/08/24 1144 02/08/24 1530 02/08/24 1534 02/08/24 2102  BP: 100/66 93/66  101/68  Pulse: 100  93 93  Resp: 17  18 19   Temp: (!) 100.5 F (38.1 C)  99.9 F (37.7 C) 98.9 F (37.2 C)  TempSrc: Oral  Oral Oral  SpO2: 96%  100% 100%    Physical Exam Constitutional:      Appearance: Normal appearance.  HENT:     Head: Normocephalic and atraumatic.     Right Ear: There is impacted cerumen.     Ears:     Comments: Left tympanic membrane with ruptured TM and discharge throughout the canal.  Consistent with suppurative otitis media.  No proptosis of either ear or tenderness in the postauricular region.    Mouth/Throat:     Mouth: Mucous membranes are moist.     Pharynx: Oropharynx is clear.  Eyes:     Extraocular Movements: Extraocular movements intact.     Pupils: Pupils are equal, round, and reactive to light.  Cardiovascular:     Rate and Rhythm: Normal rate and regular rhythm.  Pulmonary:     Effort: Pulmonary effort is normal.     Breath sounds: Normal breath sounds. No wheezing, rhonchi or rales.  Abdominal:     General: Abdomen is flat.     Palpations: Abdomen is soft.     Tenderness: There is no abdominal tenderness. There is no guarding or rebound.  Musculoskeletal:        General: Normal range of motion.  Skin:    General: Skin is warm and dry.  Neurological:     General: No focal deficit present.     Mental Status: He is alert and oriented to person, place, and time.     GCS: GCS eye subscore is 4. GCS verbal subscore is 5.  GCS motor subscore is 6.     Cranial Nerves: Cranial nerves 2-12 are intact.     Comments: 3/5 right-sided strength.  5 out of 5 left-sided strength.  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    Parathyroid  abnormality    ectopic parathyroid   gland   Presence of arteriovenous fistula for hemodialysis, primary    RUE PER PT RLE   Refusal of blood product    NO WHOLE BLOOD PROUCTS   Renal cell carcinoma (HCC)    s/p hand assisted laparoscopic bilateral nephrectomies 11/29/17, + RCC left   Secondary hyperparathyroidism    Seizures (HCC)    one episode in past, due to elevated Potassium 08/02/20- at least 4 years ago   Sleep apnea    doesn't use CPAP anymore since weight loss   Stroke [facility])    no residual     Past Surgical History:  Procedure Laterality Date   ABDOMINAL AORTOGRAM W/LOWER EXTREMITY N/A 12/09/2023   Procedure: ABDOMINAL AORTOGRAM W/LOWER EXTREMITY;  Surgeon: Sheree Penne Bruckner, MD;  Location: [facility] INVASIVE CV LAB;  Service: Cardiovascular;  Laterality: N/A;   ANTERIOR CERVICAL CORPECTOMY N/A 05/25/2023   Procedure: ANTERIOR CERVICAL CORPECTOMY CERVICAL FOUR, CERVICAL THREE - CERVICAL FIVE FUSION;  Surgeon: [name] Shove, MD;  Location: [facility] OR;  Service: Neurosurgery;  Laterality: N/A;   AV FISTULA PLACEMENT Right    right arm   BIOPSY  08/01/2019   Procedure: BIOPSY;  Surgeon: Kristie Lamprey, MD;  Location: [facility]  ENDOSCOPY;  Service: Endoscopy;;   BIOPSY  11/18/2020   Procedure: BIOPSY;  Surgeon: Stacia Carl BRAVO, MD;  Location: [facility] ENDOSCOPY;  Service: Gastroenterology;;   BIOPSY  09/13/2021   Procedure: BIOPSY;  Surgeon: Wilhelmenia Aloha Raddle., MD;  Location: [facility] ENDOSCOPY;  Service: Gastroenterology;;   CATARACT EXTRACTION W/ INTRAOCULAR LENS  IMPLANT, BILATERAL     COLON SURGERY     COLONOSCOPY N/A 08/04/2015   Procedure: COLONOSCOPY;  Surgeon: Gordy CHRISTELLA Starch, MD;  Location: [facility] ENDOSCOPY;  Service: Endoscopy;  Laterality: N/A;   COLONOSCOPY  2017   JMP@ [facility]-good prep-mass -recall 1 yr   COLONOSCOPY N/A 09/13/2021   Procedure: COLONOSCOPY;  Surgeon: Mansouraty, Aloha Raddle., MD;  Location: [facility] ENDOSCOPY;  Service: Gastroenterology;  Laterality: N/A;   COLONOSCOPY WITH PROPOFOL  N/A 11/25/2020   Procedure: COLONOSCOPY WITH PROPOFOL ;  Surgeon: Federico Rosario BROCKS, MD;  Location: [facility] ENDOSCOPY;  Service: Gastroenterology;  Laterality: N/A;   COLONOSCOPY WITH PROPOFOL  N/A 09/23/2021   Procedure: COLONOSCOPY WITH PROPOFOL ;  Surgeon: San Sandor GAILS, DO;  Location: [facility] ENDOSCOPY;  Service: Gastroenterology;  Laterality: N/A;   ENTEROSCOPY N/A 09/23/2021   Procedure: ENTEROSCOPY;  Surgeon: San Sandor GAILS, DO;  Location: [facility] ENDOSCOPY;  Service: Gastroenterology;  Laterality: N/A;  Will place order for video capsule study as we may opt to place it during procedure   ESOPHAGOGASTRODUODENOSCOPY N/A 08/01/2019   Procedure: ESOPHAGOGASTRODUODENOSCOPY (EGD);  Surgeon: Kristie Lamprey, MD;  Location: [facility] ENDOSCOPY;  Service: Endoscopy;  Laterality: N/A;   ESOPHAGOGASTRODUODENOSCOPY N/A 11/18/2020   Procedure: ESOPHAGOGASTRODUODENOSCOPY (EGD);  Surgeon: Stacia Carl BRAVO, MD;  Location: [facility] ENDOSCOPY;  Service: Gastroenterology;  Laterality: N/A;   ESOPHAGOGASTRODUODENOSCOPY (EGD) WITH PROPOFOL  N/A 08/04/2019   Procedure: ESOPHAGOGASTRODUODENOSCOPY (EGD) WITH PROPOFOL ;  Surgeon: Leigh Elspeth SQUIBB, MD;  Location: [facility]  ENDOSCOPY;  Service: Gastroenterology;  Laterality: N/A;   ESOPHAGOGASTRODUODENOSCOPY (EGD) WITH PROPOFOL  N/A 09/13/2021   Procedure: ESOPHAGOGASTRODUODENOSCOPY (EGD) WITH PROPOFOL ;  Surgeon: Wilhelmenia Aloha Raddle., MD;  Location: [facility] ENDOSCOPY;  Service: Gastroenterology;  Laterality: N/A;   GIVENS CAPSULE STUDY N/A 09/23/2021   Procedure: GIVENS CAPSULE STUDY;  Surgeon: San Sandor GAILS, DO;  Location: [facility] ENDOSCOPY;  Service: Gastroenterology;  Laterality: N/A;   graft left arm Left    for dialysis x 2. Removed   HOT HEMOSTASIS  11/18/2020  Procedure: HOT HEMOSTASIS (ARGON PLASMA COAGULATION/BICAP);  Surgeon: Stacia Carl BRAVO, MD;  Location: [facility] ENDOSCOPY;  Service: Gastroenterology;;   HOT HEMOSTASIS N/A 09/23/2021   Procedure: HOT HEMOSTASIS (ARGON PLASMA COAGULATION/BICAP);  Surgeon: San Sandor GAILS, DO;  Location: [facility] ENDOSCOPY;  Service: Gastroenterology;  Laterality: N/A;   INSERTION OF DIALYSIS CATHETER     Rt chest   LAPAROSCOPIC RIGHT COLECTOMY N/A 08/05/2015   Procedure: LAPAROSCOPIC RIGHT COLECTOMY- ASCENDING;  Surgeon: Jina Nephew, MD;  Location: [facility] OR;  Service: General;  Laterality: N/A;   LOWER EXTREMITY ANGIOGRAPHY N/A 12/09/2023   Procedure: Lower Extremity Angiography;  Surgeon: Sheree Penne Bruckner, MD;  Location: [facility] INVASIVE CV LAB;  Service: Cardiovascular;  Laterality: N/A;   LOWER EXTREMITY INTERVENTION N/A 12/09/2023   Procedure: LOWER EXTREMITY INTERVENTION;  Surgeon: Sheree Penne Bruckner, MD;  Location: [facility] INVASIVE CV LAB;  Service: Cardiovascular;  Laterality: N/A;   MASS EXCISION Left 05/28/2019   Procedure: EXCISION SOFT TISSUE MASS LEFT SHOULDER;  Surgeon: Eletha Boas, MD;  Location: [facility] ORS;  Service: General;  Laterality: Left;   NEPHRECTOMY Bilateral    PARATHYROIDECTOMY N/A 06/12/2016   Procedure: TOTAL PARATHYROIDECTOMY WITH AUTOTRANSPLANTATION TO LEFT FOREARM;  Surgeon: Boas Eletha, MD;  Location: [facility] OR;  Service: General;  Laterality: N/A;    PARATHYROIDECTOMY N/A 10/23/2016   Procedure: PARATHYROIDECTOMY;  Surgeon: Eletha Boas, MD;  Location: [facility] OR;  Service: General;  Laterality: N/A;   REVERSE SHOULDER ARTHROPLASTY Right 08/24/2020   Procedure: REVERSE SHOULDER ARTHROPLASTY;  Surgeon: Cristy Bonner DASEN, MD;  Location: [facility] OR;  Service: Orthopedics;  Laterality: Right;   REVISON OF ARTERIOVENOUS FISTULA Right 07/16/2017   Procedure: REVISION OF ARTERIOVENOUS FISTULA  Right ARM;  Surgeon: Sheree Penne Bruckner, MD;  Location: [facility] OR;  Service: Vascular;  Laterality: Right;   TOTAL HIP ARTHROPLASTY Left 11/14/2020   Procedure: LEFT TOTAL HIP ARTHROPLASTY ANTERIOR APPROACH;  Surgeon: [name] Kay HERO, MD;  Location: [facility] OR;  Service: Orthopedics;  Laterality: Left;   UPPER GASTROINTESTINAL ENDOSCOPY  2021   @ [facility]     Family History  Problem Relation Age of Onset   Diabetes Father    Stroke Father    Hypertension Father    Uterine cancer Mother    Lupus Sister    Stroke Sister    Hypertension Sister    Anuerysm Brother        brain   Colon cancer Neg Hx    Esophageal cancer Neg Hx    Stomach cancer Neg Hx    Pancreatic cancer Neg Hx    Liver disease Neg Hx    Colon polyps Neg Hx    Rectal cancer Neg Hx     Social History   Tobacco Use   Smoking status: Former    Current packs/day: 0.00    Types: Cigarettes    Quit date: 06/19/1990    Years since quitting: 33.6   Smokeless tobacco: Never   Tobacco comments:    rarely cigar  Substance Use Topics   Alcohol use: Not Currently    Alcohol/week: 1.0 standard drink of alcohol    Types: 1 Cans of beer per week    Comment: occasional beer     Procedures   If procedures were preformed on this patient, they are listed below:  Procedures   Electronically signed by:   [name], M.D. PGY-2, Emergency Medicine   Please note that this documentation was produced with the assistance of voice-to-text technology and may contain errors.  Carl Hamilton,  MD 02/08/24 [phone]  "

## 2024-02-08 NOTE — ED Triage Notes (Signed)
 Pt states he has not been able to walk x past couple of days; pt states he had nerve surgery x 4 months ago; family report pt unable to more R leg last night but can move it today; also having difficulty moving R shoulder; c/o pain abd pain, neck , R shoulder and arm pain; pt evaluated for same at urgent care yesterday; denies recent illness denies fevers; dialysis pt, last treatment Friday  Pt gives verbal consent for mse

## 2024-02-08 NOTE — ED Provider Triage Note (Signed)
 Emergency Medicine Provider Triage Evaluation Note  Carl Prime Sr. , a 63 y.o. male  was evaluated in triage.  Pt complains of multiple complaints.  Patient notes pain/soreness on the right side of his body, primarily in his shoulder and his hip/lower extremity.  Patient notes that he had a fall several days ago, denies head injury or loss of consciousness.  Patient was seen urgent care yesterday and diagnosed with an ear infection, he also had an x-ray done of his right shoulder at that time that was without fracture/dislocation.  Patient denies cough, chest pain, shortness of breath, fever at home.  Review of Systems  Positive: As above Negative: As above  Physical Exam  BP 100/66 (BP Location: Left Arm)   Pulse 100   Temp (!) 100.5 F (38.1 C) (Oral)   Resp 17   SpO2 96%  Gen:   Awake, no distress   Resp:  Normal effort  MSK:   Moves extremities without difficulty  Other:  Right shoulder: Limited overhead reach secondary to pain Right hip: Tenderness to palpation  Medical Decision Making  Medically screening exam initiated at 12:01 PM.  Appropriate orders placed.  Carl Curls Sr. was informed that the remainder of the evaluation will be completed by another provider, this initial triage assessment does not replace that evaluation, and the importance of remaining in the ED until their evaluation is complete.  Low-grade fever likely secondary to known ear infection, Tylenol  given in triage   Carl Rocky SAILOR, PA-C 02/08/24 1202

## 2024-02-09 ENCOUNTER — Observation Stay (HOSPITAL_COMMUNITY)

## 2024-02-09 ENCOUNTER — Encounter (HOSPITAL_COMMUNITY): Payer: Self-pay | Admitting: Family Medicine

## 2024-02-09 DIAGNOSIS — R29898 Other symptoms and signs involving the musculoskeletal system: Secondary | ICD-10-CM | POA: Diagnosis not present

## 2024-02-09 DIAGNOSIS — R531 Weakness: Secondary | ICD-10-CM

## 2024-02-09 DIAGNOSIS — H66012 Acute suppurative otitis media with spontaneous rupture of ear drum, left ear: Secondary | ICD-10-CM | POA: Diagnosis not present

## 2024-02-09 DIAGNOSIS — H70892 Other mastoiditis and related conditions, left ear: Secondary | ICD-10-CM | POA: Diagnosis not present

## 2024-02-09 DIAGNOSIS — H902 Conductive hearing loss, unspecified: Secondary | ICD-10-CM

## 2024-02-09 DIAGNOSIS — H6121 Impacted cerumen, right ear: Secondary | ICD-10-CM

## 2024-02-09 DIAGNOSIS — R296 Repeated falls: Secondary | ICD-10-CM

## 2024-02-09 DIAGNOSIS — A419 Sepsis, unspecified organism: Secondary | ICD-10-CM | POA: Diagnosis not present

## 2024-02-09 DIAGNOSIS — H7493 Unspecified disorder of middle ear and mastoid, bilateral: Secondary | ICD-10-CM | POA: Diagnosis present

## 2024-02-09 LAB — CBC
HCT: 30.8 % — ABNORMAL LOW (ref 39.0–52.0)
Hemoglobin: 10.1 g/dL — ABNORMAL LOW (ref 13.0–17.0)
MCH: 29.4 pg (ref 26.0–34.0)
MCHC: 32.8 g/dL (ref 30.0–36.0)
MCV: 89.5 fL (ref 80.0–100.0)
Platelets: 216 K/uL (ref 150–400)
RBC: 3.44 MIL/uL — ABNORMAL LOW (ref 4.22–5.81)
RDW: 15.9 % — ABNORMAL HIGH (ref 11.5–15.5)
WBC: 12.1 K/uL — ABNORMAL HIGH (ref 4.0–10.5)
nRBC: 0 % (ref 0.0–0.2)

## 2024-02-09 LAB — BASIC METABOLIC PANEL WITH GFR
Anion gap: 17 — ABNORMAL HIGH (ref 5–15)
BUN: 61 mg/dL — ABNORMAL HIGH (ref 8–23)
CO2: 28 mmol/L (ref 22–32)
Calcium: 9.8 mg/dL (ref 8.9–10.3)
Chloride: 89 mmol/L — ABNORMAL LOW (ref 98–111)
Creatinine, Ser: 9.29 mg/dL — ABNORMAL HIGH (ref 0.61–1.24)
GFR, Estimated: 6 mL/min — ABNORMAL LOW
Glucose, Bld: 141 mg/dL — ABNORMAL HIGH (ref 70–99)
Potassium: 5.2 mmol/L — ABNORMAL HIGH (ref 3.5–5.1)
Sodium: 134 mmol/L — ABNORMAL LOW (ref 135–145)

## 2024-02-09 LAB — HEMOGLOBIN A1C
Hgb A1c MFr Bld: 5.1 % (ref 4.8–5.6)
Mean Plasma Glucose: 99.67 mg/dL

## 2024-02-09 LAB — HIV ANTIBODY (ROUTINE TESTING W REFLEX): HIV Screen 4th Generation wRfx: NONREACTIVE

## 2024-02-09 MED ORDER — PANTOPRAZOLE SODIUM 40 MG PO TBEC
40.0000 mg | DELAYED_RELEASE_TABLET | Freq: Two times a day (BID) | ORAL | Status: DC
  Administered 2024-02-09 – 2024-03-03 (×39): 40 mg via ORAL
  Filled 2024-02-09 (×26): qty 1

## 2024-02-09 MED ORDER — ASPIRIN 81 MG PO TBEC
81.0000 mg | DELAYED_RELEASE_TABLET | Freq: Every day | ORAL | Status: DC
  Administered 2024-02-09 – 2024-03-03 (×21): 81 mg via ORAL
  Filled 2024-02-09 (×15): qty 1

## 2024-02-09 MED ORDER — SODIUM CHLORIDE 0.9% FLUSH
3.0000 mL | Freq: Two times a day (BID) | INTRAVENOUS | Status: DC
  Administered 2024-02-09 – 2024-03-03 (×30): 3 mL via INTRAVENOUS

## 2024-02-09 MED ORDER — MELATONIN 5 MG PO TABS
5.0000 mg | ORAL_TABLET | Freq: Every evening | ORAL | Status: DC | PRN
  Administered 2024-02-09: 5 mg via ORAL
  Filled 2024-02-09 (×3): qty 1

## 2024-02-09 MED ORDER — OXYCODONE HCL 5 MG PO TABS
5.0000 mg | ORAL_TABLET | ORAL | Status: AC | PRN
  Administered 2024-02-09 – 2024-02-10 (×2): 5 mg via ORAL
  Filled 2024-02-09: qty 1

## 2024-02-09 MED ORDER — MIDODRINE HCL 5 MG PO TABS: 10.0000 mg | ORAL_TABLET | ORAL | Status: DC

## 2024-02-09 MED ORDER — PIPERACILLIN-TAZOBACTAM IN DEX 2-0.25 GM/50ML IV SOLN
2.2500 g | Freq: Three times a day (TID) | INTRAVENOUS | Status: DC
  Administered 2024-02-09 – 2024-02-16 (×22): 2.25 g via INTRAVENOUS
  Filled 2024-02-09 (×5): qty 50

## 2024-02-09 MED ORDER — SEVELAMER CARBONATE 800 MG PO TABS
3200.0000 mg | ORAL_TABLET | Freq: Three times a day (TID) | ORAL | Status: DC
  Administered 2024-02-09 – 2024-02-11 (×5): 3200 mg via ORAL
  Filled 2024-02-09: qty 4

## 2024-02-09 MED ORDER — SODIUM ZIRCONIUM CYCLOSILICATE 10 G PO PACK
10.0000 g | PACK | Freq: Once | ORAL | Status: AC
  Administered 2024-02-09: 10 g via ORAL
  Filled 2024-02-09: qty 1

## 2024-02-09 MED ORDER — LIDOCAINE HCL (PF) 1 % IJ SOLN: 5.0000 mL | INTRAMUSCULAR | Status: DC | PRN

## 2024-02-09 MED ORDER — ATORVASTATIN CALCIUM 10 MG PO TABS
10.0000 mg | ORAL_TABLET | Freq: Every day | ORAL | Status: DC
  Administered 2024-02-09 – 2024-03-03 (×23): 10 mg via ORAL
  Filled 2024-02-09 (×16): qty 1

## 2024-02-09 MED ORDER — ACETAMINOPHEN 325 MG PO TABS
650.0000 mg | ORAL_TABLET | Freq: Four times a day (QID) | ORAL | Status: DC | PRN
  Administered 2024-02-09 – 2024-03-02 (×17): 650 mg via ORAL
  Filled 2024-02-09 (×13): qty 2

## 2024-02-09 MED ORDER — CLOPIDOGREL BISULFATE 75 MG PO TABS
75.0000 mg | ORAL_TABLET | Freq: Every day | ORAL | Status: DC
  Administered 2024-02-09 – 2024-02-27 (×19): 75 mg via ORAL
  Filled 2024-02-09 (×12): qty 1

## 2024-02-09 MED ORDER — ACETAMINOPHEN 650 MG RE SUPP: 650.0000 mg | Freq: Four times a day (QID) | RECTAL | Status: DC | PRN

## 2024-02-09 MED ORDER — GABAPENTIN 300 MG PO CAPS
300.0000 mg | ORAL_CAPSULE | Freq: Every day | ORAL | Status: DC
  Administered 2024-02-09 – 2024-03-02 (×23): 300 mg via ORAL
  Filled 2024-02-09 (×16): qty 1

## 2024-02-09 MED ORDER — HEPARIN SODIUM (PORCINE) 5000 UNIT/ML IJ SOLN
5000.0000 [IU] | Freq: Three times a day (TID) | INTRAMUSCULAR | Status: AC
  Administered 2024-02-09 – 2024-02-20 (×33): 5000 [IU] via SUBCUTANEOUS
  Filled 2024-02-09 (×13): qty 1

## 2024-02-09 MED ORDER — MIDODRINE HCL 5 MG PO TABS
5.0000 mg | ORAL_TABLET | Freq: Three times a day (TID) | ORAL | Status: DC
  Administered 2024-02-09 – 2024-02-10 (×3): 5 mg via ORAL
  Filled 2024-02-09 (×2): qty 1

## 2024-02-09 MED ORDER — CALCITRIOL 0.5 MCG PO CAPS
1.5000 ug | ORAL_CAPSULE | ORAL | Status: DC
  Administered 2024-02-10: 1.5 ug via ORAL

## 2024-02-09 MED ORDER — IOHEXOL 350 MG/ML SOLN
75.0000 mL | Freq: Once | INTRAVENOUS | Status: AC | PRN
  Administered 2024-02-09: 75 mL via INTRAVENOUS

## 2024-02-09 MED ORDER — FENTANYL CITRATE (PF) 50 MCG/ML IJ SOSY
50.0000 ug | PREFILLED_SYRINGE | INTRAMUSCULAR | Status: AC | PRN
  Administered 2024-02-11 (×2): 50 ug via INTRAVENOUS

## 2024-02-09 NOTE — ED Notes (Signed)
 Pt given Tylenol  for pain and pillow for comfort

## 2024-02-09 NOTE — Consult Note (Signed)
 NEUROLOGY CONSULT NOTE   Date of service: February 09, 2024 Patient Name: Carl Grenz Sr. MRN:  [id number] DOB:  1960/09/22 Chief Complaint: right upper and lower extremity weakness Requesting Provider: Christobal Guadalajara, MD  History of Present Illness  Carl Canal Sr. is a 63 y.o. male with hx of with PMH of anterior cervical corpectomy C4 and C3-C5 fusion (Dr. [name], 05/25/2023), PAD s/p angioplasty/stenting right SFA and popliteal arteries 12/09/2023 for critical limb ischemia, ESRD, and more presenting with progressive right-sided weakness and pain that has been on going for about a week. Of note, he has had two falls in the last week as well. He did have a fall on to his right side 12/24 while getting out of bed.  Since that fall he has had pain in his right shoulder and along the right side of his neck. Denies loss of consciousness or hitting his head. He has had another fall since then with no loss of consciousness or head trauma. He was seen by his PCP on 12/26  for right arm/neck pain and ear pain. Was prescribed ofloxacin  0.03%.  He followed up at urgent care yesterday and was prescribed Augmentin  for 10 days for an ear infection. He is currently in outpatient physical therapy for his bilateral lower extremity strength.  Strength has been consistently documented as a 3-4 out of 5 and he is using a wheelchair or assistive device for ambulation. He has had a right shoulder replacement in the past. He is currently having pain in his right shoulder and neck. He is also having pain with movement of his right leg.  ROS  Comprehensive ROS performed and pertinent positives documented in HPI   Past History   Past Medical History:  Diagnosis Date   Acute gastric ulcer with hemorrhage    Acute GI bleeding 07/31/2019   Acute pericarditis    Anemia    hx of   Anxiety    situational    Arthritis    on meds   Borderline diabetes    Cataract    bilateral sx   Chronic headaches    Depression     situational    Diverticulitis    ESRD (end stage renal disease) (HCC)     On Renal Transplant List, [facility]; MWF (10/23/2016)   GERD (gastroesophageal reflux disease)    with certain foods   GI bleed    Hypertension    diet controlled   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    Parathyroid  abnormality    ectopic parathyroid  gland   Presence of arteriovenous fistula for hemodialysis, primary    RUE PER PT RLE   Refusal of blood product    NO WHOLE BLOOD PROUCTS   Renal cell carcinoma (HCC)    s/p hand assisted laparoscopic bilateral nephrectomies 11/29/17, + RCC left   Secondary hyperparathyroidism    Seizures (HCC)    one episode in past, due to elevated Potassium 08/02/20- at least 4 years ago   Sleep apnea    doesn't use CPAP anymore since weight loss   Stroke [facility])    no residual    Past Surgical History:  Procedure Laterality Date   ABDOMINAL AORTOGRAM W/LOWER EXTREMITY N/A 12/09/2023   Procedure: ABDOMINAL AORTOGRAM W/LOWER EXTREMITY;  Surgeon: Sheree Penne Bruckner, MD;  Location: [facility] INVASIVE CV LAB;  Service: Cardiovascular;  Laterality: N/A;   ANTERIOR CERVICAL CORPECTOMY N/A 05/25/2023   Procedure: ANTERIOR CERVICAL CORPECTOMY CERVICAL FOUR, CERVICAL THREE - CERVICAL FIVE FUSION;  Surgeon: [name],  Victory, MD;  Location: [facility] OR;  Service: Neurosurgery;  Laterality: N/A;   AV FISTULA PLACEMENT Right    right arm   BIOPSY  08/01/2019   Procedure: BIOPSY;  Surgeon: Kristie Lamprey, MD;  Location: [facility] ENDOSCOPY;  Service: Endoscopy;;   BIOPSY  11/18/2020   Procedure: BIOPSY;  Surgeon: Stacia Glendia BRAVO, MD;  Location: [facility] ENDOSCOPY;  Service: Gastroenterology;;   BIOPSY  09/13/2021   Procedure: BIOPSY;  Surgeon: Wilhelmenia Aloha Raddle., MD;  Location: [facility] ENDOSCOPY;  Service: Gastroenterology;;   CATARACT EXTRACTION W/ INTRAOCULAR LENS  IMPLANT, BILATERAL     COLON SURGERY     COLONOSCOPY N/A 08/04/2015   Procedure: COLONOSCOPY;  Surgeon: Gordy CHRISTELLA Starch, MD;  Location: [facility]  ENDOSCOPY;  Service: Endoscopy;  Laterality: N/A;   COLONOSCOPY  2017   JMP@ [facility]-good prep-mass -recall 1 yr   COLONOSCOPY N/A 09/13/2021   Procedure: COLONOSCOPY;  Surgeon: Mansouraty, Aloha Raddle., MD;  Location: [facility] ENDOSCOPY;  Service: Gastroenterology;  Laterality: N/A;   COLONOSCOPY WITH PROPOFOL  N/A 11/25/2020   Procedure: COLONOSCOPY WITH PROPOFOL ;  Surgeon: Federico Rosario BROCKS, MD;  Location: [facility] ENDOSCOPY;  Service: Gastroenterology;  Laterality: N/A;   COLONOSCOPY WITH PROPOFOL  N/A 09/23/2021   Procedure: COLONOSCOPY WITH PROPOFOL ;  Surgeon: San Sandor GAILS, DO;  Location: [facility] ENDOSCOPY;  Service: Gastroenterology;  Laterality: N/A;   ENTEROSCOPY N/A 09/23/2021   Procedure: ENTEROSCOPY;  Surgeon: San Sandor GAILS, DO;  Location: [facility] ENDOSCOPY;  Service: Gastroenterology;  Laterality: N/A;  Will place order for video capsule study as we may opt to place it during procedure   ESOPHAGOGASTRODUODENOSCOPY N/A 08/01/2019   Procedure: ESOPHAGOGASTRODUODENOSCOPY (EGD);  Surgeon: Kristie Lamprey, MD;  Location: [facility] ENDOSCOPY;  Service: Endoscopy;  Laterality: N/A;   ESOPHAGOGASTRODUODENOSCOPY N/A 11/18/2020   Procedure: ESOPHAGOGASTRODUODENOSCOPY (EGD);  Surgeon: Stacia Glendia BRAVO, MD;  Location: [facility] ENDOSCOPY;  Service: Gastroenterology;  Laterality: N/A;   ESOPHAGOGASTRODUODENOSCOPY (EGD) WITH PROPOFOL  N/A 08/04/2019   Procedure: ESOPHAGOGASTRODUODENOSCOPY (EGD) WITH PROPOFOL ;  Surgeon: Leigh Elspeth SQUIBB, MD;  Location: [facility] ENDOSCOPY;  Service: Gastroenterology;  Laterality: N/A;   ESOPHAGOGASTRODUODENOSCOPY (EGD) WITH PROPOFOL  N/A 09/13/2021   Procedure: ESOPHAGOGASTRODUODENOSCOPY (EGD) WITH PROPOFOL ;  Surgeon: Wilhelmenia Aloha Raddle., MD;  Location: [facility] ENDOSCOPY;  Service: Gastroenterology;  Laterality: N/A;   GIVENS CAPSULE STUDY N/A 09/23/2021   Procedure: GIVENS CAPSULE STUDY;  Surgeon: San Sandor GAILS, DO;  Location: [facility] ENDOSCOPY;  Service: Gastroenterology;  Laterality: N/A;   graft left arm  Left    for dialysis x 2. Removed   HOT HEMOSTASIS  11/18/2020   Procedure: HOT HEMOSTASIS (ARGON PLASMA COAGULATION/BICAP);  Surgeon: Stacia Glendia BRAVO, MD;  Location: [facility] ENDOSCOPY;  Service: Gastroenterology;;   HOT HEMOSTASIS N/A 09/23/2021   Procedure: HOT HEMOSTASIS (ARGON PLASMA COAGULATION/BICAP);  Surgeon: San Sandor GAILS, DO;  Location: [facility] ENDOSCOPY;  Service: Gastroenterology;  Laterality: N/A;   INSERTION OF DIALYSIS CATHETER     Rt chest   LAPAROSCOPIC RIGHT COLECTOMY N/A 08/05/2015   Procedure: LAPAROSCOPIC RIGHT COLECTOMY- ASCENDING;  Surgeon: Jina Nephew, MD;  Location: [facility] OR;  Service: General;  Laterality: N/A;   LOWER EXTREMITY ANGIOGRAPHY N/A 12/09/2023   Procedure: Lower Extremity Angiography;  Surgeon: Sheree Penne Bruckner, MD;  Location: [facility] INVASIVE CV LAB;  Service: Cardiovascular;  Laterality: N/A;   LOWER EXTREMITY INTERVENTION N/A 12/09/2023   Procedure: LOWER EXTREMITY INTERVENTION;  Surgeon: Sheree Penne Bruckner, MD;  Location: [facility] INVASIVE CV LAB;  Service: Cardiovascular;  Laterality: N/A;   MASS EXCISION Left 05/28/2019   Procedure: EXCISION SOFT TISSUE MASS  LEFT SHOULDER;  Surgeon: Eletha Boas, MD;  Location: [facility] ORS;  Service: General;  Laterality: Left;   NEPHRECTOMY Bilateral    PARATHYROIDECTOMY N/A 06/12/2016   Procedure: TOTAL PARATHYROIDECTOMY WITH AUTOTRANSPLANTATION TO LEFT FOREARM;  Surgeon: Boas Eletha, MD;  Location: [facility] OR;  Service: General;  Laterality: N/A;   PARATHYROIDECTOMY N/A 10/23/2016   Procedure: PARATHYROIDECTOMY;  Surgeon: Eletha Boas, MD;  Location: [facility] OR;  Service: General;  Laterality: N/A;   REVERSE SHOULDER ARTHROPLASTY Right 08/24/2020   Procedure: REVERSE SHOULDER ARTHROPLASTY;  Surgeon: Cristy Bonner DASEN, MD;  Location: [facility] OR;  Service: Orthopedics;  Laterality: Right;   REVISON OF ARTERIOVENOUS FISTULA Right 07/16/2017   Procedure: REVISION OF ARTERIOVENOUS FISTULA  Right ARM;  Surgeon: Sheree Penne Bruckner, MD;   Location: [facility] OR;  Service: Vascular;  Laterality: Right;   TOTAL HIP ARTHROPLASTY Left 11/14/2020   Procedure: LEFT TOTAL HIP ARTHROPLASTY ANTERIOR APPROACH;  Surgeon: [name] Kay HERO, MD;  Location: [facility] OR;  Service: Orthopedics;  Laterality: Left;   UPPER GASTROINTESTINAL ENDOSCOPY  2021   @ [facility]    Family History: Family History  Problem Relation Age of Onset   Diabetes Father    Stroke Father    Hypertension Father    Uterine cancer Mother    Lupus Sister    Stroke Sister    Hypertension Sister    Anuerysm Brother        brain   Colon cancer Neg Hx    Esophageal cancer Neg Hx    Stomach cancer Neg Hx    Pancreatic cancer Neg Hx    Liver disease Neg Hx    Colon polyps Neg Hx    Rectal cancer Neg Hx     Social History  reports that he quit smoking about 33 years ago. His smoking use included cigarettes. He has never used smokeless tobacco. He reports that he does not currently use alcohol after a past usage of about 1.0 standard drink of alcohol per week. He reports that he does not currently use drugs after having used the following drugs: Marijuana.  Allergies[1]  Medications  Current Medications[2]  Vitals   Vitals:   02/09/24 0939 02/09/24 0959 02/09/24 1343 02/09/24 1345  BP: (!) 83/48 (!) 92/58 102/66 102/66  Pulse: 92  84 88  Resp: 14   18  Temp: 99 F (37.2 C)  98.1 F (36.7 C)   TempSrc: Oral  Oral   SpO2: 100%  96% 100%  Weight:      Height:        Body mass index is 25.48 kg/m.   Physical Exam   Constitutional: Appears well-developed and well-nourished.  Psych: Affect appropriate to situation.  Eyes: No scleral injection.  HENT: No OP obstruction.  Head: Normocephalic.  Cardiovascular: Normal rate and regular rhythm.  Respiratory: Effort normal, non-labored breathing.  GI: Soft.  No distension. There is no tenderness.  Skin: WDI.  Vascular: right dorsalis pedis pulse dopplerable  Neurologic Examination   Neuro: Mental Status: Patient is  awake, alert, oriented to person, place, month, year, and situation. Patient is able to give a clear and coherent history. No signs of aphasia or neglect Cranial Nerves: II: Visual Fields are full. Pupils are equal, round, and reactive to light.   III,IV, VI: EOMI without ptosis or diploplia.  V: Facial sensation is symmetric to temperature VII: Facial movement is symmetric resting and smiling VIII: Hearing is intact to voice X: Palate elevates symmetrically XI: Shoulder shrug is symmetric. XII:  Tongue protrudes midline without atrophy or fasciculations.  Motor: Tone is increase in right arm. Bulk is normal.  Pain with palpation over right glenohumeral joint, acromion, and endorses soreness over the clavicle Unable to elevate past 90 degrees with out pain Resists passive ROM RLE with pain at the hip and knee. Increased tone. Resists passive range of motion Mild swelling. R DP dopplerable  LLE 4-/5, unable to maintain antigravity strength  Sensory: Sensation is symmetric to light touch and temperature in the arms and legs. No extinction to DSS present.  Cerebellar: Unable to complete HKS or R FNF. Left FNF intact.    Labs/Imaging/Neurodiagnostic studies   CBC:  Recent Labs  Lab 02-10-24 1201 02/09/24 0520  WBC 11.7* 12.1*  NEUTROABS 9.4*  --   HGB 11.4* 10.1*  HCT 35.3* 30.8*  MCV 91.7 89.5  PLT 223 216   Basic Metabolic Panel:  Lab Results  Component Value Date   NA 134 (L) 02/09/2024   K 5.2 (H) 02/09/2024   CO2 28 02/09/2024   GLUCOSE 141 (H) 02/09/2024   BUN 61 (H) 02/09/2024   CREATININE 9.29 (H) 02/09/2024   CALCIUM  9.8 02/09/2024   GFRNONAA 6 (L) 02/09/2024   GFRAA 5 (L) 08/05/2019   Lipid Panel:  Lab Results  Component Value Date   LDLCALC 84 03/08/2020   HgbA1c:  Lab Results  Component Value Date   HGBA1C 4.8 11/15/2020   Urine Drug Screen: No results found for: LABOPIA, COCAINSCRNUR, LABBENZ, AMPHETMU, THCU, LABBARB  Alcohol Level      Component Value Date/Time   ETH <10 03/12/2018 0726   INR  Lab Results  Component Value Date   INR 1.1 05/27/2023   APTT  Lab Results  Component Value Date   APTT 30 05/25/2023   AED levels: No results found for: PHENYTOIN, ZONISAMIDE, LAMOTRIGINE, LEVETIRACETA  CT Head without contrast(Personally reviewed): 1. No acute intracranial abnormality. 2. Small chronic left occipital lobe infarct. 3. Chronic left basal gangliar infarct. 4. Mild right frontal scalp soft tissue swelling.  MR C Spine(Personally reviewed): 1. C3-C5 ACDF with C4 corpectomy.  Limited evaluation of hardware by MRI. 2. Motion limited study with suspected moderate foraminal stenosis bilaterally at C3-C4 and on the left at C5-C6. Mild to moderate canal stenosis at C2-C3 and C3-C4. 3. RIght C4-C5 perifacet and C5-C6 discogenic endplate edema, likely degenerative in etiology.  MRI Brain(Personally reviewed): 1. No acute intracranial abnormality related to the head trauma. 2. Large bilateral mastoid effusions. 3. Small remote infarct in the left frontal periventricular white matter and remote lacunar infarcts in the left basal ganglia. 4. Remote infarct in the left occipital lobe.   ASSESSMENT   Carl Albus Sr. is a 63 y.o. male  hx of with PMH of anterior cervical corpectomy C4 and C3-C5 fusion (Dr. [name], 05/25/2023), PAD s/p angioplasty/stenting right SFA and popliteal arteries 12/09/2023 for critical limb ischemia, ESRD, and more presenting with falls resulting in progressive right-sided weakness and pain for about a week as well as an ear infection. He   He has a white count, has spiked fevers and also has what appears to be an acute bacterial otomastoiditis on MRI - I say this because the entire left mastoid process plus the adjacent petrous ridge of the petrous bone on the left is bright on DWI, consistent with purulent infection. Would get ENT consult. Seems like this is the most concerning  finding.   RECOMMENDATIONS  - MRI T and L spine - consider MR shoulder  ______________________________________________________________________    Bonney Jorene Last, NP [facility]     [1]  Allergies Allergen Reactions   Tera Bertie Dextran] Other (See Comments)    Decreased BP    Vicodin [Hydrocodone -Acetaminophen ] Other (See Comments)    Decreased BP   Hydrocodone     Diclofenac Other (See Comments)    Caused hypotension  [2]  Current Facility-Administered Medications:    acetaminophen  (TYLENOL ) tablet 650 mg, 650 mg, Oral, Q6H PRN, 650 mg at 02/09/24 0555 **OR** acetaminophen  (TYLENOL ) suppository 650 mg, 650 mg, Rectal, Q6H PRN, Bryn Bernardino NOVAK, MD   aspirin  EC tablet 81 mg, 81 mg, Oral, Daily, Bryn Bernardino NOVAK, MD, 81 mg at 02/09/24 1049   atorvastatin  (LIPITOR) tablet 10 mg, 10 mg, Oral, Daily, Bryn Bernardino B, MD, 10 mg at 02/09/24 1049   [START ON 02/10/2024] calcitRIOL  (ROCALTROL ) capsule 1.5 mcg, 1.5 mcg, Oral, Q M,W,F-HD, Kc, Ramesh, MD   ciprofloxacin -dexamethasone  (CIPRODEX ) 0.3-0.1 % OTIC (EAR) suspension 4 drop, 4 drop, Left EAR, BID, Guillermina Hamilton, MD, 4 drop at 02/09/24 1049   clopidogrel  (PLAVIX ) tablet 75 mg, 75 mg, Oral, Daily, Bryn Bernardino B, MD, 75 mg at 02/09/24 1049   gabapentin  (NEURONTIN ) capsule 300 mg, 300 mg, Oral, QHS, Kc, Ramesh, MD   heparin  injection 5,000 Units, 5,000 Units, Subcutaneous, Q8H, Grunz, Ryan B, MD, 5,000 Units at 02/09/24 1350   lidocaine  (LIDODERM ) 5 % 1 patch, 1 patch, Transdermal, Q24H, Scott, Erin N, PA-C, 1 patch at 02/09/24 1343   melatonin tablet 5 mg, 5 mg, Oral, QHS PRN, Christobal Guadalajara, MD   [START ON 02/10/2024] midodrine  (PROAMATINE ) tablet 10 mg, 10 mg, Oral, Q M,W,F-HD, Ejigiri, Ogechi Grace, PA-C   midodrine  (PROAMATINE ) tablet 5 mg, 5 mg, Oral, TID WC, Kc, Ramesh, MD, 5 mg at 02/09/24 1049   pantoprazole  (PROTONIX ) EC tablet 40 mg, 40 mg, Oral, BID AC, Kc, Ramesh, MD, 40 mg at 02/09/24 1048    piperacillin -tazobactam (ZOSYN ) IVPB 2.25 g, 2.25 g, Intravenous, Q8H, Clair Lynwood CROME, RPH, Stopped at 02/09/24 0641   psyllium (HYDROCIL/METAMUCIL) 1 packet, 1 packet, Oral, Daily, Guillermina Hamilton, MD, 1 packet at 02/09/24 1048   sevelamer  carbonate (RENVELA ) tablet 3,200-4,000 mg, 3,200-4,000 mg, Oral, TID WC, Kc, Ramesh, MD, 3,200 mg at 02/09/24 1338   sodium chloride  flush (NS) 0.9 % injection 3 mL, 3 mL, Intravenous, Q12H, Bryn Bernardino B, MD, 3 mL at 02/09/24 1049  Current Outpatient Medications:    amoxicillin -clavulanate (AUGMENTIN ) 875-125 MG tablet, Take 1 tablet by mouth every 12 (twelve) hours for 10 days., Disp: 20 tablet, Rfl: 0   acetaminophen  (TYLENOL ) 325 MG tablet, Take 1-2 tablets (325-650 mg total) by mouth every 4 (four) hours as needed for mild pain (pain score 1-3)., Disp: , Rfl:    amoxicillin  (AMOXIL ) 500 MG capsule, TAKE FOUR CAPSULES BY MOUTH ONE HOUR BEFORE DENTAL WORK/PROCEDURE, Disp: 8 capsule, Rfl: 0   atorvastatin  (LIPITOR) 10 MG tablet, Take 1 tablet (10 mg total) by mouth every Thursday., Disp: 30 tablet, Rfl: 2   augmented betamethasone  dipropionate (DIPROLENE -AF) 0.05 % ointment, Apply 1 Application topically 2 (two) times daily as needed for rash., Disp: , Rfl:    calcitRIOL  (ROCALTROL ) 0.5 MCG capsule, Take 3 capsules (1.5 mcg total) by mouth every Monday, Wednesday, and Friday with hemodialysis., Disp: , Rfl:    camphor-menthol  (SARNA) lotion, Apply topically., Disp: , Rfl:    cinacalcet  (SENSIPAR ) 60 MG tablet, , Disp: , Rfl:    clopidogrel  (PLAVIX ) 75 MG tablet, Take 1 tablet (  75 mg total) by mouth daily., Disp: 30 tablet, Rfl: 11   cromolyn  (OPTICROM ) 4 % ophthalmic solution, Place 1 drop into both eyes daily as needed (redness)., Disp: , Rfl:    cyclobenzaprine  (FLEXERIL ) 10 MG tablet, Take 1 tablet (10 mg total) by mouth 3 (three) times daily as needed for muscle spasms., Disp: 30 tablet, Rfl: 0   diphenhydrAMINE  (BENADRYL ) 25 mg capsule, Take 1 capsule (25  mg total) by mouth every 6 (six) hours as needed for itching., Disp: , Rfl:    gabapentin  (NEURONTIN ) 100 MG capsule, Take 3 capsules (300 mg total) by mouth at bedtime., Disp: 30 capsule, Rfl: 2   ibuprofen (ADVIL) 800 MG tablet, SMARTSIG:1 Tablet(s) By Mouth Every 12 Hours PRN, Disp: , Rfl:    lidocaine  (LIDODERM ) 5 %, Place 1 patch onto the skin daily. Remove & Discard patch within 12 hours, Disp: 30 patch, Rfl: 0   LOKELMA  10 g PACK packet, Take 1 packet by mouth daily., Disp: , Rfl:    melatonin 5 MG TABS, Take 1 tablet (5 mg total) by mouth at bedtime as needed., Disp: , Rfl:    Methoxy PEG-Epoetin  Beta (MIRCERA IJ), 75 mcg., Disp: , Rfl:    midodrine  (PROAMATINE ) 10 MG tablet, Take 1 tablet (10 mg total) by mouth every dialysis (30 min before each dialysis.)., Disp: 15 tablet, Rfl: 5   multivitamin (RENA-VIT) TABS tablet, Take 1 tablet by mouth daily., Disp: , Rfl:    Nystatin (GERHARDT'S BUTT CREAM) CREA, Apply 1 Application topically 2 (two) times daily., Disp: , Rfl:    ofloxacin  (FLOXIN ) 0.3 % OTIC solution, Place 10 drops into the left ear daily for 7 days., Disp: 10 mL, Rfl: 0   oxyCODONE -acetaminophen  (PERCOCET/ROXICET) 5-325 MG tablet, Take 1-2 tablets by mouth every 6 (six) hours as needed for severe pain (pain score 7-10) (5mg  for moderate pain and 10 mg for severe pain)., Disp: 20 tablet, Rfl: 0   pantoprazole  (PROTONIX ) 40 MG tablet, Take 1 tablet (40 mg total) by mouth 2 (two) times daily before a meal. (Patient taking differently: Take 40 mg by mouth daily as needed (for acid reflux).), Disp: 60 tablet, Rfl: 1   pentafluoroprop-tetrafluoroeth (GEBAUERS) AERO, Apply 1 Application topically as needed (topical anesthesia for hemodialysis)., Disp: , Rfl:    polyethylene glycol powder (GLYCOLAX /MIRALAX ) 17 GM/SCOOP powder, Take 17 g by mouth 2 (two) times daily as needed for mild constipation., Disp: , Rfl:    predniSONE  (DELTASONE ) 10 MG tablet, Take by mouth., Disp: , Rfl:     psyllium (HYDROCIL/METAMUCIL) 95 % PACK, Take 1 packet by mouth 3 (three) times daily., Disp: , Rfl:    senna-docusate (SENOKOT-S) 8.6-50 MG tablet, Take 2 tablets by mouth 2 (two) times daily., Disp: 20 tablet, Rfl: 0   sevelamer  carbonate (RENVELA ) 800 MG tablet, Take 3,200-4,000 mg by mouth 3 (three) times daily with meals., Disp: , Rfl:    topiramate  (TOPAMAX ) 25 MG tablet, Take 1 tablet (25 mg total) by mouth 2 (two) times daily., Disp: , Rfl:

## 2024-02-09 NOTE — Consult Note (Signed)
 Reason for Consult: Recurrent weakness on the right side Referring Physician: Dr. Guillermina [name] is an 63 y.o. male.  HPI: The patient is a 63 year old right-handed individual with end-stage renal disease on dialysis who presented back in April with profound weakness in his upper and lower extremities.  He underwent cervical decompression with a corpectomy at C4 by Dr. [name] and over period of a month or so he regain substantial strength and was well able to walk and function independently again.  He notes that in the past 3 or 4 days he has developed significant weakness of the right side.  He was seen in the emergency department and an MRI of the cervical spine and MRI of the brain was performed.  Patient has evidence of some lacunar infarcts on the left side in the past in the region of the basal ganglia and some remote infarcts but no acute stroke evidence is noted.  MRI of the cervical spine demonstrates that the patient has a widely patent canal across the level of C4 was corpectomies he does have some residual mild stenosis at C3-4.  Were consulted because the patient notes that his weakness on the right side feels similar to what he was experiencing before.  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    Parathyroid  abnormality    ectopic parathyroid  gland   Presence of arteriovenous fistula for hemodialysis, primary    RUE PER PT RLE   Refusal of blood  product    NO WHOLE BLOOD PROUCTS   Renal cell carcinoma (HCC)    s/p hand assisted laparoscopic bilateral nephrectomies 11/29/17, + RCC left   Secondary hyperparathyroidism    Seizures (HCC)    one episode in past, due to elevated Potassium 08/02/20- at least 4 years ago   Sleep apnea    doesn't use CPAP anymore since weight loss   Stroke [facility])    no residual    Past Surgical History:  Procedure Laterality Date   ABDOMINAL AORTOGRAM W/LOWER EXTREMITY N/A 12/09/2023   Procedure: ABDOMINAL AORTOGRAM W/LOWER EXTREMITY;  Surgeon: Sheree Penne Bruckner, MD;  Location: [facility] INVASIVE CV LAB;  Service: Cardiovascular;  Laterality: N/A;   ANTERIOR CERVICAL CORPECTOMY N/A 05/25/2023   Procedure: ANTERIOR CERVICAL CORPECTOMY CERVICAL FOUR, CERVICAL THREE - CERVICAL FIVE FUSION;  Surgeon: [name] Shove, MD;  Location: [facility] OR;  Service: Neurosurgery;  Laterality: N/A;   AV FISTULA PLACEMENT Right    right arm   BIOPSY  08/01/2019   Procedure: BIOPSY;  Surgeon: Kristie Lamprey, MD;  Location: [facility] ENDOSCOPY;  Service: Endoscopy;;   BIOPSY  11/18/2020   Procedure: BIOPSY;  Surgeon: Stacia Glendia BRAVO, MD;  Location: [facility] ENDOSCOPY;  Service: Gastroenterology;;   BIOPSY  09/13/2021   Procedure: BIOPSY;  Surgeon: Wilhelmenia Roers  Mickey., MD;  Location: [facility] ENDOSCOPY;  Service: Gastroenterology;;   CATARACT EXTRACTION W/ INTRAOCULAR LENS  IMPLANT, BILATERAL     COLON SURGERY     COLONOSCOPY N/A 08/04/2015   Procedure: COLONOSCOPY;  Surgeon: Gordy CHRISTELLA Starch, MD;  Location: [facility] ENDOSCOPY;  Service: Endoscopy;  Laterality: N/A;   COLONOSCOPY  2017   JMP@ [facility]-good prep-mass -recall 1 yr   COLONOSCOPY N/A 09/13/2021   Procedure: COLONOSCOPY;  Surgeon: Mansouraty, Aloha Mickey., MD;  Location: [facility] ENDOSCOPY;  Service: Gastroenterology;  Laterality: N/A;   COLONOSCOPY WITH PROPOFOL  N/A 11/25/2020   Procedure: COLONOSCOPY WITH PROPOFOL ;  Surgeon: Federico Rosario BROCKS, MD;  Location: [facility] ENDOSCOPY;  Service: Gastroenterology;   Laterality: N/A;   COLONOSCOPY WITH PROPOFOL  N/A 09/23/2021   Procedure: COLONOSCOPY WITH PROPOFOL ;  Surgeon: San Sandor GAILS, DO;  Location: [facility] ENDOSCOPY;  Service: Gastroenterology;  Laterality: N/A;   ENTEROSCOPY N/A 09/23/2021   Procedure: ENTEROSCOPY;  Surgeon: San Sandor GAILS, DO;  Location: [facility] ENDOSCOPY;  Service: Gastroenterology;  Laterality: N/A;  Will place order for video capsule study as we may opt to place it during procedure   ESOPHAGOGASTRODUODENOSCOPY N/A 08/01/2019   Procedure: ESOPHAGOGASTRODUODENOSCOPY (EGD);  Surgeon: Kristie Lamprey, MD;  Location: [facility] ENDOSCOPY;  Service: Endoscopy;  Laterality: N/A;   ESOPHAGOGASTRODUODENOSCOPY N/A 11/18/2020   Procedure: ESOPHAGOGASTRODUODENOSCOPY (EGD);  Surgeon: Stacia Glendia BRAVO, MD;  Location: [facility] ENDOSCOPY;  Service: Gastroenterology;  Laterality: N/A;   ESOPHAGOGASTRODUODENOSCOPY (EGD) WITH PROPOFOL  N/A 08/04/2019   Procedure: ESOPHAGOGASTRODUODENOSCOPY (EGD) WITH PROPOFOL ;  Surgeon: Leigh Elspeth SQUIBB, MD;  Location: [facility] ENDOSCOPY;  Service: Gastroenterology;  Laterality: N/A;   ESOPHAGOGASTRODUODENOSCOPY (EGD) WITH PROPOFOL  N/A 09/13/2021   Procedure: ESOPHAGOGASTRODUODENOSCOPY (EGD) WITH PROPOFOL ;  Surgeon: Wilhelmenia Aloha Mickey., MD;  Location: [facility] ENDOSCOPY;  Service: Gastroenterology;  Laterality: N/A;   GIVENS CAPSULE STUDY N/A 09/23/2021   Procedure: GIVENS CAPSULE STUDY;  Surgeon: San Sandor GAILS, DO;  Location: [facility] ENDOSCOPY;  Service: Gastroenterology;  Laterality: N/A;   graft left arm Left    for dialysis x 2. Removed   HOT HEMOSTASIS  11/18/2020   Procedure: HOT HEMOSTASIS (ARGON PLASMA COAGULATION/BICAP);  Surgeon: Stacia Glendia BRAVO, MD;  Location: [facility] ENDOSCOPY;  Service: Gastroenterology;;   HOT HEMOSTASIS N/A 09/23/2021   Procedure: HOT HEMOSTASIS (ARGON PLASMA COAGULATION/BICAP);  Surgeon: San Sandor GAILS, DO;  Location: [facility] ENDOSCOPY;  Service: Gastroenterology;  Laterality: N/A;   INSERTION OF DIALYSIS  CATHETER     Rt chest   LAPAROSCOPIC RIGHT COLECTOMY N/A 08/05/2015   Procedure: LAPAROSCOPIC RIGHT COLECTOMY- ASCENDING;  Surgeon: Jina Nephew, MD;  Location: [facility] OR;  Service: General;  Laterality: N/A;   LOWER EXTREMITY ANGIOGRAPHY N/A 12/09/2023   Procedure: Lower Extremity Angiography;  Surgeon: Sheree Penne Bruckner, MD;  Location: [facility] INVASIVE CV LAB;  Service: Cardiovascular;  Laterality: N/A;   LOWER EXTREMITY INTERVENTION N/A 12/09/2023   Procedure: LOWER EXTREMITY INTERVENTION;  Surgeon: Sheree Penne Bruckner, MD;  Location: [facility] INVASIVE CV LAB;  Service: Cardiovascular;  Laterality: N/A;   MASS EXCISION Left 05/28/2019   Procedure: EXCISION SOFT TISSUE MASS LEFT SHOULDER;  Surgeon: Eletha Boas, MD;  Location: [facility] ORS;  Service: General;  Laterality: Left;   NEPHRECTOMY Bilateral    PARATHYROIDECTOMY N/A 06/12/2016   Procedure: TOTAL PARATHYROIDECTOMY WITH AUTOTRANSPLANTATION TO LEFT FOREARM;  Surgeon: Boas Eletha, MD;  Location: [facility] OR;  Service: General;  Laterality: N/A;   PARATHYROIDECTOMY N/A 10/23/2016   Procedure: PARATHYROIDECTOMY;  Surgeon: Eletha Boas, MD;  Location: [facility] OR;  Service: General;  Laterality: N/A;   REVERSE  SHOULDER ARTHROPLASTY Right 08/24/2020   Procedure: REVERSE SHOULDER ARTHROPLASTY;  Surgeon: Cristy Bonner DASEN, MD;  Location: [facility] OR;  Service: Orthopedics;  Laterality: Right;   REVISON OF ARTERIOVENOUS FISTULA Right 07/16/2017   Procedure: REVISION OF ARTERIOVENOUS FISTULA  Right ARM;  Surgeon: Sheree Penne Bruckner, MD;  Location: [facility] OR;  Service: Vascular;  Laterality: Right;   TOTAL HIP ARTHROPLASTY Left 11/14/2020   Procedure: LEFT TOTAL HIP ARTHROPLASTY ANTERIOR APPROACH;  Surgeon: [name] Kay HERO, MD;  Location: [facility] OR;  Service: Orthopedics;  Laterality: Left;   UPPER GASTROINTESTINAL ENDOSCOPY  2021   @ [facility]    Family History  Problem Relation Age of Onset   Diabetes Father    Stroke Father    Hypertension Father    Uterine cancer Mother     Lupus Sister    Stroke Sister    Hypertension Sister    Anuerysm Brother        brain   Colon cancer Neg Hx    Esophageal cancer Neg Hx    Stomach cancer Neg Hx    Pancreatic cancer Neg Hx    Liver disease Neg Hx    Colon polyps Neg Hx    Rectal cancer Neg Hx     Social History:  reports that he quit smoking about 33 years ago. His smoking use included cigarettes. He has never used smokeless tobacco. He reports that he does not currently use alcohol after a past usage of about 1.0 standard drink of alcohol per week. He reports that he does not currently use drugs after having used the following drugs: Marijuana.  Allergies: Allergies[1]  Medications: I have reviewed the patient's current medications.  Results for orders placed or performed during the hospital encounter of 02/08/24 (from the past 48 hours)  CBC with Differential     Status: Abnormal   Collection Time: 02/08/24 12:01 PM  Result Value Ref Range   WBC 11.7 (H) 4.0 - 10.5 K/uL   RBC 3.85 (L) 4.22 - 5.81 MIL/uL   Hemoglobin 11.4 (L) 13.0 - 17.0 g/dL   HCT 64.6 (L) 60.9 - 47.9 %   MCV 91.7 80.0 - 100.0 fL   MCH 29.6 26.0 - 34.0 pg   MCHC 32.3 30.0 - 36.0 g/dL   RDW 83.9 (H) 88.4 - 84.4 %   Platelets 223 150 - 400 K/uL   nRBC 0.0 0.0 - 0.2 %   Neutrophils Relative % 80 %   Neutro Abs 9.4 (H) 1.7 - 7.7 K/uL   Lymphocytes Relative 4 %   Lymphs Abs 0.5 (L) 0.7 - 4.0 K/uL   Monocytes Relative 14 %   Monocytes Absolute 1.6 (H) 0.1 - 1.0 K/uL   Eosinophils Relative 0 %   Eosinophils Absolute 0.0 0.0 - 0.5 K/uL   Basophils Relative 1 %   Basophils Absolute 0.1 0.0 - 0.1 K/uL   Immature Granulocytes 1 %   Abs Immature Granulocytes 0.08 (H) 0.00 - 0.07 K/uL    Comment: Performed at [facility], [address], [place], [name]  Basic metabolic panel     Status: Abnormal   Collection Time: 02/08/24 12:01 PM  Result Value Ref Range   Sodium 135 135 - 145 mmol/L   Potassium 5.0 3.5 - 5.1 mmol/L    Chloride 89 (L) 98 - 111 mmol/L   CO2 28 22 - 32 mmol/L   Glucose, Bld 101 (H) 70 - 99 mg/dL    Comment: Glucose reference range applies  only to samples taken after fasting for at least 8 hours.   BUN 48 (H) 8 - 23 mg/dL   Creatinine, Ser 2.19 (H) 0.61 - 1.24 mg/dL   Calcium  10.8 (H) 8.9 - 10.3 mg/dL   GFR, Estimated 7 (L) >60 mL/min    Comment: (NOTE) Calculated using the CKD-EPI Creatinine Equation (2021)    Anion gap 18 (H) 5 - 15    Comment: Performed at [facility], [address] [place], [name]  Resp panel by RT-PCR (RSV, Flu A&B, Covid) Anterior Nasal Swab     Status: None   Collection Time: 02/08/24 12:01 PM   Specimen: Anterior Nasal Swab  Result Value Ref Range   SARS Coronavirus 2 by RT PCR NEGATIVE NEGATIVE   Influenza A by PCR NEGATIVE NEGATIVE   Influenza B by PCR NEGATIVE NEGATIVE    Comment: (NOTE) The Xpert Xpress SARS-CoV-2/FLU/RSV plus assay is intended as an aid in the diagnosis of influenza from Nasopharyngeal swab specimens and should not be used as a sole basis for treatment. Nasal washings and aspirates are unacceptable for Xpert Xpress SARS-CoV-2/FLU/RSV testing.  Fact Sheet for Patients: bloggercourse.com  Fact Sheet for Healthcare Providers: seriousbroker.it  This test is not yet approved or cleared by the [place]  FDA and has been authorized for detection and/or diagnosis of SARS-CoV-2 by FDA under an Emergency Use Authorization (EUA). This EUA will remain in effect (meaning this test can be used) for the duration of the COVID-19 declaration under Section 564(b)(1) of the Act, 21 U.S.C. section 360bbb-3(b)(1), unless the authorization is terminated or revoked.     Resp Syncytial Virus by PCR NEGATIVE NEGATIVE    Comment: (NOTE) Fact Sheet for Patients: bloggercourse.com  Fact Sheet for Healthcare  Providers: seriousbroker.it  This test is not yet approved or cleared by the [place]  FDA and has been authorized for detection and/or diagnosis of SARS-CoV-2 by FDA under an Emergency Use Authorization (EUA). This EUA will remain in effect (meaning this test can be used) for the duration of the COVID-19 declaration under Section 564(b)(1) of the Act, 21 U.S.C. section 360bbb-3(b)(1), unless the authorization is terminated or revoked.  Performed at [facility], [address], [place], [name]     MR Cervical Spine Wo Contrast Result Date: 02/08/2024 EXAM: MRI CERVICAL SPINE WITHOUT CONTRAST 02/08/2024 06:05:08 PM TECHNIQUE: Multiplanar multisequence MRI of the cervical spine was performed. COMPARISON: None available. CLINICAL HISTORY: Ataxia, nontraumatic, cervical pathology suspected FINDINGS: BONES AND ALIGNMENT: C3-C5 ACDF with C4 corpectomy. Normal alignment. Edema surrounding the C5-C6 disc is likely degenerative/discogenic. RIght C4-C5 perifact edema, also likely degenerative. Otherwise, Bone marrow signal is unremarkable. SPINAL CORD: Normal spinal cord size. No abnormal spinal cord signal. SOFT TISSUES: No paraspinal mass. C2-C3: Ligamentum flavum thickening and mild facet arthropathy. Resulting mild to moderate canal stenosis. C3-C4: ACDF. Ligamentum flavum thickening and bilateral facet arthropathy. Mild to moderate canal stenosis. Moderate bilateral foraminal stenosis. C4-C5: ACDF. C4 corpectomy. Facter athropathy greater on the right where there also is perifacet edema. Motion limited assessment with widely patent canal and likely patent foramina. C5-C6: Small posterior disc osteophyte complex . Bilateral facet and uncovertebral hypertrophy. Motion-limited assessment with suspected moderate left foraminal stenosis. Patent canal and right foramen. C6-C7: Right .in the left facet and uncovertebral hypertrophy. Right foraminal stenosis.  Patent canal and left foramen. C7-T1: No significant disc herniation. No spinal canal stenosis or neural foraminal narrowing. IMPRESSION: 1. C3-C5 ACDF with C4 corpectomy.  Limited  evaluation of hardware by MRI. 2. Motion limited study with suspected moderate foraminal stenosis bilaterally at C3-C4 and on the left at C5-C6. Mild to moderate canal stenosis at C2-C3 and C3-C4. 3. RIght C4-C5 perifacet and C5-C6 discogenic endplate edema, likely degenerative in etiology. Electronically signed by: Gilmore Molt 02/08/2024 08:16 PM EST RP Workstation: [id number]   MR BRAIN WO CONTRAST Result Date: 02/08/2024 EXAM: MRI BRAIN WITHOUT CONTRAST 02/08/2024 05:44:06 PM TECHNIQUE: Multiplanar multisequence MRI of the head/brain was performed without the administration of intravenous contrast. COMPARISON: Same day CT head and comparison MRI brain 05/25/2023. CLINICAL HISTORY: Ataxia, head trauma. FINDINGS: BRAIN AND VENTRICLES: Similar appearance of extensive dural calcifications. T2 and FLAIR hyperintensity in the periventricular and subcortical white matter compatible with mild chronic microvascular ischemic changes. There is a small remote infarct in the left frontal periventricular white matter which is similar to prior. Lacunar infarcts in the left basal ganglia. Remote infarct in the left occipital lobe. No acute infarct. No intracranial hemorrhage. No mass. No midline shift. No hydrocephalus. The sella is unremarkable. Normal flow voids. ORBITS: Bilateral lens replacements. 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 Large bilateral mastoid effusions. Mucosal thickening in the right maxillary sinus. BONES AND SOFT TISSUES: Subtle right frontal scalp soft tissue swelling better evaluated on the same day CT. Partially visualized cervical fusion hardware. Normal marrow signal. IMPRESSION: 1. No acute intracranial abnormality related to the head trauma. 2. Large bilateral mastoid  effusions. 3. Small remote infarct in the left frontal periventricular white matter and remote lacunar infarcts in the left basal ganglia. 4. Remote infarct in the left occipital lobe. Electronically signed by: Donnice Mania MD 02/08/2024 06:59 PM EST RP Workstation: [id number]   CT Head Wo Contrast Result Date: 02/08/2024 CLINICAL DATA:  Status post trauma. EXAM: CT HEAD WITHOUT CONTRAST TECHNIQUE: Contiguous axial images were obtained from the base of the skull through the vertex without intravenous contrast. RADIATION DOSE REDUCTION: This exam was performed according to the departmental dose-optimization program which includes automated exposure control, adjustment of the mA and/or kV according to patient size and/or use of iterative reconstruction technique. COMPARISON:  June 09, 2023 FINDINGS: Brain: No evidence of acute infarction, hemorrhage, hydrocephalus, extra-axial collection or mass lesion/mass effect. Stable marked severity pachymeningeal calcification is noted. There is a small chronic left occipital lobe infarct. A chronic left basal gangliar infarct is also seen. Vascular: Marked severity bilateral cavernous carotid artery calcification is noted. Skull: Normal. Negative for fracture or focal lesion. Sinuses/Orbits: No acute finding. Other: There is mild right frontal scalp soft tissue swelling. IMPRESSION: 1. No acute intracranial abnormality. 2. Small chronic left occipital lobe infarct. 3. Chronic left basal gangliar infarct. 4. Mild right frontal scalp soft tissue swelling. Electronically Signed   By: Suzen Dials M.D.   On: 02/08/2024 17:57   DG Hip Unilat W or Wo Pelvis 2-3 Views Right Result Date: 02/08/2024 EXAM: 2 OR MORE VIEW(S) XRAY OF THE BILATERAL HIP 02/08/2024 12:45:00 PM COMPARISON: None available. CLINICAL HISTORY: fall, R sided pain/soreness FINDINGS: BONES AND JOINTS: Left Hip arthroplasty in place. Large subcortical cysts at the Right Femoral Neck and Head. Mild  degenerative changes of the Right Hip. No acute fracture. No malalignment. SOFT TISSUES: Right lower quadrant surgical clips noted. Extensive vascular calcifications. IMPRESSION: 1. No acute fracture or dislocation. 2. Large subcortical cysts at the right femoral neck and head. 3. Left hip arthroplasty in place. Electronically signed by: Morgane Naveau MD 02/08/2024 01:48 PM EST RP Workstation:  [id number]   DG Shoulder 1 View Right Result Date: 02/08/2024 CLINICAL DATA:  Right shoulder pain after a fall. EXAM: RIGHT SHOULDER - 1 VIEW COMPARISON:  09/12/2023 FINDINGS: A single view of the right shoulder shows the patient to be status post reverse arthroplasty. No hardware complication evident on this single film. No obvious shoulder dislocation. IMPRESSION: Status post reverse arthroplasty without complicating features. Electronically Signed   By: Camellia Candle M.D.   On: 02/08/2024 08:38   DG Ribs Unilateral W/Chest Right Result Date: 02/07/2024 EXAM: AP VIEW XRAY OF THE RIGHT RIBS AND CHEST 02/07/2024 02:45:02 PM COMPARISON: None available. CLINICAL HISTORY: right shoulder pain, h/o fall FINDINGS: BONES: Reverse right shoulder arthroplasty. No acute displaced rib fracture. LUNGS AND PLEURA: Linear atelectasis or scarring at left lung base. No pleural effusion or pneumothorax. HEART AND MEDIASTINUM: Calcified aorta. No acute abnormality of the cardiac and mediastinal silhouettes. IMPRESSION: 1. No acute rib fracture.Please note, nondisplaced rib fracture may be occult on radiograph and is not excluded. 2. No acute cardiopulmonary abnormality. Electronically signed by: Morgane Naveau MD 02/07/2024 03:25 PM EST RP Workstation: [id number]   DG Cervical Spine 2-3 Views Result Date: 02/07/2024 EXAM: 2 or 3 VIEW(S) XRAY OF THE CERVICAL SPINE 02/07/2024 02:44:49 PM COMPARISON: X-ray cervical spine from 03/22/2023. CLINICAL HISTORY: right shoulder pain, h/o fall FINDINGS: LIMITATIONS: Limited evaluation due to  overlapping osseous structures and overlying soft tissues. On the lateral view of the C6 and c7 vertebral bodies are not visualized. BONES: Vertebral body heights are maintained. Alignment is normal. C3 to C5 discectomy and fusion that appears similar to prior. DISCS AND DEGENERATIVE CHANGES: Multilevel at least mild degenerative changes of the spine. SOFT TISSUES: Atherosclerotic plaque. No prevertebral soft tissue swelling. The visualized lungs appear clear. IMPRESSION: 1. No acute osseous abnormality.Limited evaluation due to overlapping osseous structures and overlying soft tissues. 2. C3 to C5 discectomy and fusion that appears similar to prior. Electronically signed by: Morgane Naveau MD 02/07/2024 03:23 PM EST RP Workstation: [id number]    Review of Systems  Constitutional:  Positive for activity change.  Musculoskeletal:  Positive for gait problem.  Neurological:  Positive for weakness.   Blood pressure 101/68, pulse 93, temperature (!) 102.1 F (38.9 C), temperature source Oral, resp. rate 19, SpO2 100%. Physical Exam Constitutional:      Appearance: Normal appearance.  HENT:     Head: Normocephalic and atraumatic.     Right Ear: Tympanic membrane, ear canal and external ear normal.     Left Ear: Tympanic membrane, ear canal and external ear normal.     Nose: Nose normal.     Mouth/Throat:     Mouth: Mucous membranes are moist.     Pharynx: Oropharynx is clear.  Eyes:     Extraocular Movements: Extraocular movements intact.     Conjunctiva/sclera: Conjunctivae normal.     Pupils: Pupils are equal, round, and reactive to light.  Cardiovascular:     Rate and Rhythm: Normal rate and regular rhythm.     Pulses: Normal pulses.     Heart sounds: Normal heart sounds.  Pulmonary:     Effort: Pulmonary effort is normal.     Breath sounds: Normal breath sounds.  Abdominal:     General: Abdomen is flat. Bowel sounds are normal.     Palpations: Abdomen is soft.  Musculoskeletal:      Cervical back: Neck supple.  Neurological:     Mental Status: He is alert.     Comments: Is awake  and alert.  He is strong in the upper extremities but clearly weaker on the right side in the upper and lower extremities compared paired to the left.  His strength is graded at 4- out of 5 in the grip strength intrinsic strength biceps and triceps on the right and similarly in the lower extremities iliopsoas quadriceps tibialis anterior and gastrocs are all 4- out of 5.  Psychiatric:        Mood and Affect: Mood normal.        Behavior: Behavior normal.        Thought Content: Thought content normal.        Judgment: Judgment normal.     Assessment/Plan: Worsening weakness in the right side that has evolved over the last 3 to 4 days.  Patient does not have any evidence of new cord compression he has some slight stenosis at the level of C3-C4 but I am not sure that this is causing any problem for him at this time.  Will follow along clinically but no suggestions for neurosurgical intervention present at this time.  Victory PARAS Denesia Donelan 02/09/2024, 12:36 AM         [1]  Allergies Allergen Reactions   Infed Bertie Dextran] Other (See Comments)    Decreased BP    Vicodin [Hydrocodone -Acetaminophen ] Other (See Comments)    Decreased BP   Hydrocodone     Diclofenac Other (See Comments)    Caused hypotension

## 2024-02-09 NOTE — Care Management (Signed)
 Attempted MOON notice will need in AM

## 2024-02-09 NOTE — Consult Note (Signed)
 Reason for Consult:Suppurative otitis media Referring Physician: Dr. Mennie LAMY  Carl Curls Sr. is an 63 y.o. male.  HPI:  63 y.o. male with PMH of anterior cervical corpectomy C4 and C3-C5 fusion (Dr. [name], 05/25/2023), PAD s/p angioplasty/stenting right SFA and popliteal arteries 12/09/2023 for critical limb ischemia, ESRD, and more presenting with progressive right-sided weakness and pain x 1 wk. patient reports right leg acting funny about 1 wk w/ pain and weakness, worsening over the past several days.He also reports progressive right arm and shoulder weakness, now unable to lift the arm overhead.The left arm and leg are unaffected. Patient had multiple falls at home. also complains of left ear pain and drainage despite recent cerumen disimpaction earlier in the week and subsequent use of ear drops. Seen at [facility] 12/26 X-rays reportedly without acute findings  and was started on oral antibiotics  In ED: Vitals with mild tachycardia fever, labs with mild leukocytosis potassium 5.0 influenza COVID-negative developed a fever felt most consistent with left otitis media.Primary concern was worsening neurologic deficits in the setting of prior cervical spine surgery. CT head and hip X-rays were unremarkable MRI brain>> no acute findings  MRI cervical spine >>demonstrated no interval change from prior surgery and no clear etiology for current weakness. Imaging did note prior left-sided strokes, without evidence of acute stroke or recrudescence. Neurosurgery consulted and Dr. Colon recommends no acute intervention, with plan to continue following as inpatient.  Pt seen and evaluated by myself. He reports decreased hearing on the left side that started one week ago. It has been draining and feels like it is muffled or under water . He denies any issues with the right ear. He denies tenderness behind his ears and facial weakness. He reports acute weakness to the right upper extremity and right lower  extremity.    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    Parathyroid  abnormality    ectopic parathyroid  gland   Presence of arteriovenous fistula for hemodialysis, primary    RUE PER PT RLE   Refusal of blood product    NO WHOLE BLOOD PROUCTS   Renal cell carcinoma (HCC)    s/p hand assisted laparoscopic bilateral nephrectomies 11/29/17, + RCC left   Secondary hyperparathyroidism    Seizures (HCC)    one episode in past, due to elevated Potassium 08/02/20- at least 4 years ago   Sleep apnea    doesn't use CPAP anymore since weight loss   Stroke [facility])    no residual    Past Surgical History:  Procedure Laterality Date   ABDOMINAL AORTOGRAM W/LOWER EXTREMITY N/A 12/09/2023   Procedure: ABDOMINAL AORTOGRAM W/LOWER EXTREMITY;  Surgeon: Sheree Penne Bruckner, MD;  Location: [facility] INVASIVE CV LAB;  Service: Cardiovascular;  Laterality: N/A;  ANTERIOR CERVICAL CORPECTOMY N/A 05/25/2023   Procedure: ANTERIOR CERVICAL CORPECTOMY CERVICAL FOUR, CERVICAL THREE - CERVICAL FIVE FUSION;  Surgeon: [name] Shove, MD;  Location: [facility] OR;  Service: Neurosurgery;  Laterality: N/A;   AV FISTULA PLACEMENT Right    right arm   BIOPSY  08/01/2019   Procedure: BIOPSY;  Surgeon: Kristie Lamprey, MD;  Location: [facility] ENDOSCOPY;  Service: Endoscopy;;   BIOPSY  11/18/2020   Procedure: BIOPSY;  Surgeon: Stacia Glendia BRAVO, MD;  Location: [facility] ENDOSCOPY;  Service:  Gastroenterology;;   BIOPSY  09/13/2021   Procedure: BIOPSY;  Surgeon: Wilhelmenia Aloha Raddle., MD;  Location: [facility] ENDOSCOPY;  Service: Gastroenterology;;   CATARACT EXTRACTION W/ INTRAOCULAR LENS  IMPLANT, BILATERAL     COLON SURGERY     COLONOSCOPY N/A 08/04/2015   Procedure: COLONOSCOPY;  Surgeon: Gordy CHRISTELLA Starch, MD;  Location: [facility] ENDOSCOPY;  Service: Endoscopy;  Laterality: N/A;   COLONOSCOPY  2017   JMP@ [facility]-good prep-mass -recall 1 yr   COLONOSCOPY N/A 09/13/2021   Procedure: COLONOSCOPY;  Surgeon: Mansouraty, Aloha Raddle., MD;  Location: [facility] ENDOSCOPY;  Service: Gastroenterology;  Laterality: N/A;   COLONOSCOPY WITH PROPOFOL  N/A 11/25/2020   Procedure: COLONOSCOPY WITH PROPOFOL ;  Surgeon: Federico Rosario BROCKS, MD;  Location: [facility] ENDOSCOPY;  Service: Gastroenterology;  Laterality: N/A;   COLONOSCOPY WITH PROPOFOL  N/A 09/23/2021   Procedure: COLONOSCOPY WITH PROPOFOL ;  Surgeon: San Sandor GAILS, DO;  Location: [facility] ENDOSCOPY;  Service: Gastroenterology;  Laterality: N/A;   ENTEROSCOPY N/A 09/23/2021   Procedure: ENTEROSCOPY;  Surgeon: San Sandor GAILS, DO;  Location: [facility] ENDOSCOPY;  Service: Gastroenterology;  Laterality: N/A;  Will place order for video capsule study as we may opt to place it during procedure   ESOPHAGOGASTRODUODENOSCOPY N/A 08/01/2019   Procedure: ESOPHAGOGASTRODUODENOSCOPY (EGD);  Surgeon: Kristie Lamprey, MD;  Location: [facility] ENDOSCOPY;  Service: Endoscopy;  Laterality: N/A;   ESOPHAGOGASTRODUODENOSCOPY N/A 11/18/2020   Procedure: ESOPHAGOGASTRODUODENOSCOPY (EGD);  Surgeon: Stacia Glendia BRAVO, MD;  Location: [facility] ENDOSCOPY;  Service: Gastroenterology;  Laterality: N/A;   ESOPHAGOGASTRODUODENOSCOPY (EGD) WITH PROPOFOL  N/A 08/04/2019   Procedure: ESOPHAGOGASTRODUODENOSCOPY (EGD) WITH PROPOFOL ;  Surgeon: Leigh Elspeth SQUIBB, MD;  Location: [facility] ENDOSCOPY;  Service: Gastroenterology;  Laterality: N/A;   ESOPHAGOGASTRODUODENOSCOPY (EGD) WITH PROPOFOL  N/A 09/13/2021   Procedure:  ESOPHAGOGASTRODUODENOSCOPY (EGD) WITH PROPOFOL ;  Surgeon: Wilhelmenia Aloha Raddle., MD;  Location: [facility] ENDOSCOPY;  Service: Gastroenterology;  Laterality: N/A;   GIVENS CAPSULE STUDY N/A 09/23/2021   Procedure: GIVENS CAPSULE STUDY;  Surgeon: San Sandor GAILS, DO;  Location: [facility] ENDOSCOPY;  Service: Gastroenterology;  Laterality: N/A;   graft left arm Left    for dialysis x 2. Removed   HOT HEMOSTASIS  11/18/2020   Procedure: HOT HEMOSTASIS (ARGON PLASMA COAGULATION/BICAP);  Surgeon: Stacia Glendia BRAVO, MD;  Location: [facility] ENDOSCOPY;  Service: Gastroenterology;;   HOT HEMOSTASIS N/A 09/23/2021   Procedure: HOT HEMOSTASIS (ARGON PLASMA COAGULATION/BICAP);  Surgeon: San Sandor GAILS, DO;  Location: [facility] ENDOSCOPY;  Service: Gastroenterology;  Laterality: N/A;   INSERTION OF DIALYSIS CATHETER     Rt chest   LAPAROSCOPIC RIGHT COLECTOMY N/A 08/05/2015   Procedure: LAPAROSCOPIC RIGHT COLECTOMY- ASCENDING;  Surgeon: Jina Nephew, MD;  Location: [facility] OR;  Service: General;  Laterality: N/A;   LOWER EXTREMITY ANGIOGRAPHY N/A 12/09/2023   Procedure: Lower Extremity Angiography;  Surgeon: Sheree Penne Bruckner, MD;  Location: [facility] INVASIVE CV LAB;  Service: Cardiovascular;  Laterality: N/A;   LOWER EXTREMITY INTERVENTION N/A 12/09/2023   Procedure: LOWER EXTREMITY INTERVENTION;  Surgeon: Sheree Penne Bruckner, MD;  Location: [facility]  INVASIVE CV LAB;  Service: Cardiovascular;  Laterality: N/A;   MASS EXCISION Left 05/28/2019   Procedure: EXCISION SOFT TISSUE MASS LEFT SHOULDER;  Surgeon: Eletha Boas, MD;  Location: [facility] ORS;  Service: General;  Laterality: Left;   NEPHRECTOMY Bilateral    PARATHYROIDECTOMY N/A 06/12/2016   Procedure: TOTAL PARATHYROIDECTOMY WITH AUTOTRANSPLANTATION TO LEFT FOREARM;  Surgeon: Boas Eletha, MD;  Location: [facility] OR;  Service: General;  Laterality: N/A;   PARATHYROIDECTOMY N/A 10/23/2016   Procedure: PARATHYROIDECTOMY;  Surgeon: Eletha Boas, MD;  Location: [facility] OR;  Service: General;   Laterality: N/A;   REVERSE SHOULDER ARTHROPLASTY Right 08/24/2020   Procedure: REVERSE SHOULDER ARTHROPLASTY;  Surgeon: Cristy Bonner DASEN, MD;  Location: [facility] OR;  Service: Orthopedics;  Laterality: Right;   REVISON OF ARTERIOVENOUS FISTULA Right 07/16/2017   Procedure: REVISION OF ARTERIOVENOUS FISTULA  Right ARM;  Surgeon: Sheree Penne Bruckner, MD;  Location: [facility] OR;  Service: Vascular;  Laterality: Right;   TOTAL HIP ARTHROPLASTY Left 11/14/2020   Procedure: LEFT TOTAL HIP ARTHROPLASTY ANTERIOR APPROACH;  Surgeon: [name] Kay HERO, MD;  Location: [facility] OR;  Service: Orthopedics;  Laterality: Left;   UPPER GASTROINTESTINAL ENDOSCOPY  2021   @ [facility]    Family History  Problem Relation Age of Onset   Diabetes Father    Stroke Father    Hypertension Father    Uterine cancer Mother    Lupus Sister    Stroke Sister    Hypertension Sister    Anuerysm Brother        brain   Colon cancer Neg Hx    Esophageal cancer Neg Hx    Stomach cancer Neg Hx    Pancreatic cancer Neg Hx    Liver disease Neg Hx    Colon polyps Neg Hx    Rectal cancer Neg Hx     Social History:  reports that he quit smoking about 33 years ago. His smoking use included cigarettes. He has never used smokeless tobacco. He reports that he does not currently use alcohol after a past usage of about 1.0 standard drink of alcohol per week. He reports that he does not currently use drugs after having used the following drugs: Marijuana.  Allergies: Allergies[1]  Medications: I have reviewed the patient's current medications.  Results for orders placed or performed during the hospital encounter of 02/08/24 (from the past 48 hours)  CBC with Differential     Status: Abnormal   Collection Time: 02/08/24 12:01 PM  Result Value Ref Range   WBC 11.7 (H) 4.0 - 10.5 K/uL   RBC 3.85 (L) 4.22 - 5.81 MIL/uL   Hemoglobin 11.4 (L) 13.0 - 17.0 g/dL   HCT 64.6 (L) 60.9 - 47.9 %   MCV 91.7 80.0 - 100.0 fL   MCH 29.6 26.0 - 34.0 pg   MCHC 32.3  30.0 - 36.0 g/dL   RDW 83.9 (H) 88.4 - 84.4 %   Platelets 223 150 - 400 K/uL   nRBC 0.0 0.0 - 0.2 %   Neutrophils Relative % 80 %   Neutro Abs 9.4 (H) 1.7 - 7.7 K/uL   Lymphocytes Relative 4 %   Lymphs Abs 0.5 (L) 0.7 - 4.0 K/uL   Monocytes Relative 14 %   Monocytes Absolute 1.6 (H) 0.1 - 1.0 K/uL   Eosinophils Relative 0 %   Eosinophils Absolute 0.0 0.0 - 0.5 K/uL   Basophils Relative 1 %   Basophils Absolute 0.1 0.0 - 0.1 K/uL   Immature Granulocytes 1 %  Abs Immature Granulocytes 0.08 (H) 0.00 - 0.07 K/uL    Comment: Performed at [facility], [address], [place], [name]  Basic metabolic panel     Status: Abnormal   Collection Time: 02/08/24 12:01 PM  Result Value Ref Range   Sodium 135 135 - 145 mmol/L   Potassium 5.0 3.5 - 5.1 mmol/L   Chloride 89 (L) 98 - 111 mmol/L   CO2 28 22 - 32 mmol/L   Glucose, Bld 101 (H) 70 - 99 mg/dL    Comment: Glucose reference range applies only to samples taken after fasting for at least 8 hours.   BUN 48 (H) 8 - 23 mg/dL   Creatinine, Ser 2.19 (H) 0.61 - 1.24 mg/dL   Calcium  10.8 (H) 8.9 - 10.3 mg/dL   GFR, Estimated 7 (L) >60 mL/min    Comment: (NOTE) Calculated using the CKD-EPI Creatinine Equation (2021)    Anion gap 18 (H) 5 - 15    Comment: Performed at [facility], [address], [place], [name]  Resp panel by RT-PCR (RSV, Flu A&B, Covid) Anterior Nasal Swab     Status: None   Collection Time: 02/08/24 12:01 PM   Specimen: Anterior Nasal Swab  Result Value Ref Range   SARS Coronavirus 2 by RT PCR NEGATIVE NEGATIVE   Influenza A by PCR NEGATIVE NEGATIVE   Influenza B by PCR NEGATIVE NEGATIVE    Comment: (NOTE) The Xpert Xpress SARS-CoV-2/FLU/RSV plus assay is intended as an aid in the diagnosis of influenza from Nasopharyngeal swab specimens and should not be used as a sole basis for treatment. Nasal washings and aspirates are unacceptable for Xpert Xpress  SARS-CoV-2/FLU/RSV testing.  Fact Sheet for Patients: bloggercourse.com  Fact Sheet for Healthcare Providers: seriousbroker.it  This test is not yet approved or cleared by the [place]  FDA and has been authorized for detection and/or diagnosis of SARS-CoV-2 by FDA under an Emergency Use Authorization (EUA). This EUA will remain in effect (meaning this test can be used) for the duration of the COVID-19 declaration under Section 564(b)(1) of the Act, 21 U.S.C. section 360bbb-3(b)(1), unless the authorization is terminated or revoked.     Resp Syncytial Virus by PCR NEGATIVE NEGATIVE    Comment: (NOTE) Fact Sheet for Patients: bloggercourse.com  Fact Sheet for Healthcare Providers: seriousbroker.it  This test is not yet approved or cleared by the [place]  FDA and has been authorized for detection and/or diagnosis of SARS-CoV-2 by FDA under an Emergency Use Authorization (EUA). This EUA will remain in effect (meaning this test can be used) for the duration of the COVID-19 declaration under Section 564(b)(1) of the Act, 21 U.S.C. section 360bbb-3(b)(1), unless the authorization is terminated or revoked.  Performed at [facility], [address], [place], [name]   Blood culture (routine x 2)     Status: None (Preliminary result)   Collection Time: 02/08/24  9:53 PM   Specimen: BLOOD  Result Value Ref Range   Specimen Description BLOOD SITE NOT SPECIFIED    Special Requests      BOTTLES DRAWN AEROBIC AND ANAEROBIC Blood Culture results may not be optimal due to an inadequate volume of blood received in culture bottles   Culture      NO GROWTH < 12 HOURS Performed at [facility], [address] [place], [name]    Report Status PENDING   Blood culture (routine x 2)  Status: None (Preliminary result)   Collection Time: 02/08/24  10:39 PM   Specimen: BLOOD  Result Value Ref Range   Specimen Description BLOOD BLOOD LEFT WRIST    Special Requests      BOTTLES DRAWN AEROBIC AND ANAEROBIC Blood Culture adequate volume   Culture      NO GROWTH < 12 HOURS Performed at [facility], [address] [place], [name]    Report Status PENDING   HIV Antibody (routine testing w rflx)     Status: None   Collection Time: 02/09/24  5:20 AM  Result Value Ref Range   HIV Screen 4th Generation wRfx Non Reactive Non Reactive    Comment: Performed at [facility], [address], [place], [name]  Basic metabolic panel     Status: Abnormal   Collection Time: 02/09/24  5:20 AM  Result Value Ref Range   Sodium 134 (L) 135 - 145 mmol/L   Potassium 5.2 (H) 3.5 - 5.1 mmol/L   Chloride 89 (L) 98 - 111 mmol/L   CO2 28 22 - 32 mmol/L   Glucose, Bld 141 (H) 70 - 99 mg/dL    Comment: Glucose reference range applies only to samples taken after fasting for at least 8 hours.   BUN 61 (H) 8 - 23 mg/dL   Creatinine, Ser 0.70 (H) 0.61 - 1.24 mg/dL   Calcium  9.8 8.9 - 10.3 mg/dL   GFR, Estimated 6 (L) >60 mL/min    Comment: (NOTE) Calculated using the CKD-EPI Creatinine Equation (2021)    Anion gap 17 (H) 5 - 15    Comment: Performed at [facility], [address], [place], [name]  CBC     Status: Abnormal   Collection Time: 02/09/24  5:20 AM  Result Value Ref Range   WBC 12.1 (H) 4.0 - 10.5 K/uL   RBC 3.44 (L) 4.22 - 5.81 MIL/uL   Hemoglobin 10.1 (L) 13.0 - 17.0 g/dL   HCT 69.1 (L) 60.9 - 47.9 %   MCV 89.5 80.0 - 100.0 fL   MCH 29.4 26.0 - 34.0 pg   MCHC 32.8 30.0 - 36.0 g/dL   RDW 84.0 (H) 88.4 - 84.4 %   Platelets 216 150 - 400 K/uL   nRBC 0.0 0.0 - 0.2 %    Comment: Performed at [facility], [address], [place], [name]    MR Cervical Spine Wo Contrast Result Date: 02/08/2024 EXAM: MRI CERVICAL SPINE WITHOUT CONTRAST 02/08/2024 06:05:08 PM TECHNIQUE:  Multiplanar multisequence MRI of the cervical spine was performed. COMPARISON: None available. CLINICAL HISTORY: Ataxia, nontraumatic, cervical pathology suspected FINDINGS: BONES AND ALIGNMENT: C3-C5 ACDF with C4 corpectomy. Normal alignment. Edema surrounding the C5-C6 disc is likely degenerative/discogenic. RIght C4-C5 perifact edema, also likely degenerative. Otherwise, Bone marrow signal is unremarkable. SPINAL CORD: Normal spinal cord size. No abnormal spinal cord signal. SOFT TISSUES: No paraspinal mass. C2-C3: Ligamentum flavum thickening and mild facet arthropathy. Resulting mild to moderate canal stenosis. C3-C4: ACDF. Ligamentum flavum thickening and bilateral facet arthropathy. Mild to moderate canal stenosis. Moderate bilateral foraminal stenosis. C4-C5: ACDF. C4 corpectomy. Facter athropathy greater on the right where there also is perifacet edema. Motion limited assessment with widely patent canal and likely patent foramina. C5-C6: Small posterior disc osteophyte complex . Bilateral facet and uncovertebral hypertrophy. Motion-limited assessment with suspected moderate left foraminal stenosis. Patent canal and right foramen. C6-C7: Right .in the left facet and uncovertebral  hypertrophy. Right foraminal stenosis. Patent canal and left foramen. C7-T1: No significant disc herniation. No spinal canal stenosis or neural foraminal narrowing. IMPRESSION: 1. C3-C5 ACDF with C4 corpectomy.  Limited evaluation of hardware by MRI. 2. Motion limited study with suspected moderate foraminal stenosis bilaterally at C3-C4 and on the left at C5-C6. Mild to moderate canal stenosis at C2-C3 and C3-C4. 3. RIght C4-C5 perifacet and C5-C6 discogenic endplate edema, likely degenerative in etiology. Electronically signed by: Gilmore Molt 02/08/2024 08:16 PM EST RP Workstation: [id number]   MR BRAIN WO CONTRAST Result Date: 02/08/2024 EXAM: MRI BRAIN WITHOUT CONTRAST 02/08/2024 05:44:06 PM TECHNIQUE: Multiplanar  multisequence MRI of the head/brain was performed without the administration of intravenous contrast. COMPARISON: Same day CT head and comparison MRI brain 05/25/2023. CLINICAL HISTORY: Ataxia, head trauma. FINDINGS: BRAIN AND VENTRICLES: Similar appearance of extensive dural calcifications. T2 and FLAIR hyperintensity in the periventricular and subcortical white matter compatible with mild chronic microvascular ischemic changes. There is a small remote infarct in the left frontal periventricular white matter which is similar to prior. Lacunar infarcts in the left basal ganglia. Remote infarct in the left occipital lobe. No acute infarct. No intracranial hemorrhage. No mass. No midline shift. No hydrocephalus. The sella is unremarkable. Normal flow voids. ORBITS: Bilateral lens replacements. 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 Large bilateral mastoid effusions. Mucosal thickening in the right maxillary sinus. BONES AND SOFT TISSUES: Subtle right frontal scalp soft tissue swelling better evaluated on the same day CT. Partially visualized cervical fusion hardware. Normal marrow signal. IMPRESSION: 1. No acute intracranial abnormality related to the head trauma. 2. Large bilateral mastoid effusions. 3. Small remote infarct in the left frontal periventricular white matter and remote lacunar infarcts in the left basal ganglia. 4. Remote infarct in the left occipital lobe. Electronically signed by: Donnice Mania MD 02/08/2024 06:59 PM EST RP Workstation: [id number]   CT Head Wo Contrast Result Date: 02/08/2024 CLINICAL DATA:  Status post trauma. EXAM: CT HEAD WITHOUT CONTRAST TECHNIQUE: Contiguous axial images were obtained from the base of the skull through the vertex without intravenous contrast. RADIATION DOSE REDUCTION: This exam was performed according to the departmental dose-optimization program which includes automated exposure control, adjustment of the mA and/or kV  according to patient size and/or use of iterative reconstruction technique. COMPARISON:  June 09, 2023 FINDINGS: Brain: No evidence of acute infarction, hemorrhage, hydrocephalus, extra-axial collection or mass lesion/mass effect. Stable marked severity pachymeningeal calcification is noted. There is a small chronic left occipital lobe infarct. A chronic left basal gangliar infarct is also seen. Vascular: Marked severity bilateral cavernous carotid artery calcification is noted. Skull: Normal. Negative for fracture or focal lesion. Sinuses/Orbits: No acute finding. Other: There is mild right frontal scalp soft tissue swelling. IMPRESSION: 1. No acute intracranial abnormality. 2. Small chronic left occipital lobe infarct. 3. Chronic left basal gangliar infarct. 4. Mild right frontal scalp soft tissue swelling. Electronically Signed   By: Suzen Dials M.D.   On: 02/08/2024 17:57   DG Hip Unilat W or Wo Pelvis 2-3 Views Right Result Date: 02/08/2024 EXAM: 2 OR MORE VIEW(S) XRAY OF THE BILATERAL HIP 02/08/2024 12:45:00 PM COMPARISON: None available. CLINICAL HISTORY: fall, R sided pain/soreness FINDINGS: BONES AND JOINTS: Left Hip arthroplasty in place. Large subcortical cysts at the Right Femoral Neck and Head. Mild degenerative changes of the Right Hip. No acute fracture. No malalignment. SOFT TISSUES: Right lower quadrant surgical clips noted. Extensive vascular calcifications. IMPRESSION: 1. No acute fracture  or dislocation. 2. Large subcortical cysts at the right femoral neck and head. 3. Left hip arthroplasty in place. Electronically signed by: Morgane Naveau MD 02/08/2024 01:48 PM EST RP Workstation: [id number]    ROS Blood pressure 102/66, pulse 88, temperature 98.1 F (36.7 C), temperature source Oral, resp. rate 18, height 6' 1 (1.854 m), weight 87.6 kg, SpO2 100%. Physical Exam Vitals and nursing note reviewed.  Constitutional:      Appearance: Normal appearance. He is normal weight.   HENT:     Head: Normocephalic and atraumatic. No abrasion, contusion, masses or laceration. Hair is normal.     Jaw: There is normal jaw occlusion. No trismus, tenderness or swelling.     Right Ear: There is impacted cerumen.     Left Ear: Drainage present. No swelling or tenderness.     Mouth/Throat:     Mouth: Mucous membranes are moist. No injury.     Pharynx: Oropharynx is clear.  Eyes:     General: No visual field deficit. Cardiovascular:     Rate and Rhythm: Normal rate.  Pulmonary:     Effort: Pulmonary effort is normal.  Abdominal:     General: Abdomen is flat.  Skin:    General: Skin is warm and dry.  Neurological:     Mental Status: He is alert and oriented to person, place, and time.     Cranial Nerves: Cranial nerves 2-12 are intact. No cranial nerve deficit, dysarthria or facial asymmetry.   Purulent discharge left EAC. Unable to visualize TM. No EAC edema. No posterior auricular edema, tenderenss or erythema. Right EAC occluded with cerumen. No purulence.   Assessment/Plan: Left suppurative otitis media with suspected TM perforation - start ciprodex  drops BID x 14 days.Augmentin  x 10 days - CT scan reviewed no evidence of coalescent mastoiditis. No physical exam findings consistent with mastoiditis. + left mod mastoid efffusion. Right mild mastoid effusion. No tegmen dehiscence.   - no brain abscess on imaging Right EAC cerumen impaction Follow up with me in clinic for aural toilet and reevaluation in 1-2 weeks.   Penne Croak 02/09/2024, 4:43 PM        [1]  Allergies Allergen Reactions   Infed [Iron Dextran] Other (See Comments)    Decreased BP    Vicodin [Hydrocodone -Acetaminophen ] Other (See Comments)    Decreased BP   Hydrocodone     Diclofenac Other (See Comments)    Caused hypotension

## 2024-02-09 NOTE — Progress Notes (Addendum)
 " PROGRESS NOTE Carl Aderhold Sr.  FMW:[id number] DOB: 24-Apr-1960 DOA: 02/08/2024 PCP: [name], MD  Brief Narrative/Hospital Course: Carl Lamarque Sr. is a 63 y.o. male with PMH of anterior cervical corpectomy C4 and C3-C5 fusion (Dr. [name], 05/25/2023), PAD s/p angioplasty/stenting right SFA and popliteal arteries 12/09/2023 for critical limb ischemia, ESRD, and more presenting with progressive right-sided weakness and pain x 1 wk. patient reports right leg acting funny about 1 wk w/ pain and weakness, worsening over the past several days.He also reports progressive right arm and shoulder weakness, now unable to lift the arm overhead.The left arm and leg are unaffected. Patient had multiple falls at home. also complains of left ear pain and drainage despite recent cerumen disimpaction earlier in the week and subsequent use of ear drops. Seen at [facility] 12/26 X-rays reportedly without acute findings  and was started on oral antibiotics In ED: Vitals with mild tachycardia fever, labs with mild leukocytosis potassium 5.0 influenza COVID-negative developed a fever felt most consistent with left otitis media.Primary concern was worsening neurologic deficits in the setting of prior cervical spine surgery. CT head and hip X-rays were unremarkable MRI brain>> no acute findings  MRI cervical spine >>demonstrated no interval change from prior surgery and no clear etiology for current weakness. Imaging did note prior left-sided strokes, without evidence of acute stroke or recrudescence. Neurosurgery consulted and Dr. Colon recommends no acute intervention, with plan to continue following as inpatient.  Subjective: Seen and examined today He is alert awake, Reports similar weakness in his right side Per PT more weak today Overnight Tmax 102.1, VSS-with mild tachycardia, BP soft in 80s-100s-asymptomatic,100% room air, Labs-mild mild hyperkalemia, leukocytosis   Assessment and plan:  Sepsis  POA Suppurative left acute otitis media: Source of sepsis likely from recent otitis media.?Other sources-but no respiratory symptoms, AVF intact, COVID influenza negative.  BP stable, WBC still up, follow blood cultures trend labs continue Zosyn  Ciprodex  eardrops.  Check RVP panel. Consulted Dr [name] w/ ENT and discussed and will obtain CT temporal bone w/ and wo.  ESRD on HD MWF Mild hyperkalemia Metabolic bone disease: Nephro consulted for HD per [facility] schedule.  Last HD 12/26 .Continue home Renvela  and calcitriol .  Med rec pending  Worsening weakness on the right side with multiple falls at home S/P cervical decompression with a corpectomy at C4 by Dr. [name] in April Remote left occipital infarct: Postsurgery over a period month or so patient was able to regain substantial strength and able to walk function independently Now with right-sided weakness over 3 to 4 days, has some slight stenosis at the level of C3-C4, MRI of the brain, MRI C-spine no significant finding no evidence of cord new cord compression.  Seen by neurosurgery no intervention advised at this time.  Suspect recrudescence of prior symptoms in the setting of infection.  Monitor clinically consult PT OT patient needing 3 person assist today.  Neurosurgery is following and with worsening weakness for PT although patient endorses similar weakness for past several days and not worse, I called neurosurgery to relay, I spoke w/ Dr Basilia is not on call today- then again called office,and left message to answering service, waiting for call back. Spoke w/ Dr Onetha how is aware - will get neuro to see to evaluate further. Consulted neurology as well, he has remove left occipital infarct. ENT will be seeing him.  PAD S/p angiogram with shockwave lithotripsy, drug-coated balloon angioplasty, and stenting of the right SFA and popliteal arteries:  Continue  home aspirin  Plavix  statin.  GERD: Continue PPI  Essential  hypertension Hypotension: BP is soft.Add midodrine   T2DM w/ diabetic peripheral angiopathy without gangrene and HLD: Blood sugar controlled.  Last A1c normal in 2022.  Mobility: PT Orders: Active PT Follow up Rec: Acute Inpatient Rehab (3hours/Day)02/09/2024 0800   DVT prophylaxis: heparin  injection 5,000 Units Start: 02/09/24 0600 Code Status:   Code Status: Full Code Family Communication: plan of care discussed with patient at bedside. Patient status is: Remains hospitalized because of severity of illness Level of care: Telemetry   Dispo: The patient is from: home            Anticipated disposition: TBD Objective: Vitals last 24 hrs: Vitals:   02/09/24 0548 02/09/24 0939 02/09/24 0959 02/09/24 1343  BP:  (!) 83/48 (!) 92/58 102/66  Pulse:  92  84  Resp:  14    Temp:  99 F (37.2 C)  98.1 F (36.7 C)  TempSrc:  Oral  Oral  SpO2:  100%  96%  Weight: 87.6 kg     Height: 6' 1 (1.854 m)      Physical Examination: General exam: alert awake, oriented HEENT:Oral mucosa moist, Ear/Nose WNL grossly Respiratory system: Bilaterally clear BS,no use of accessory muscle Cardiovascular system: S1 & S2 +, No JVD. Gastrointestinal system: Abdomen soft,NT,ND, BS+ Nervous System: Alert, awake, moving left side well, weakness on right side arm Leg 2-3//5, rt quadriceps and R ant tibilias ~ 4/5 Extremities: extremities warm, leg edema neg Skin: Warm, no rashes MSK: Normal muscle bulk,tone, power  RUE avf+ w/ thrill  Medications reviewed:  Scheduled Meds:  aspirin  EC  81 mg Oral Daily   atorvastatin   10 mg Oral Daily   [START ON 02/10/2024] calcitRIOL   1.5 mcg Oral Q M,W,F-HD   ciprofloxacin -dexamethasone   4 drop Left EAR BID   clopidogrel   75 mg Oral Daily   gabapentin   300 mg Oral QHS   heparin   5,000 Units Subcutaneous Q8H   lidocaine   1 patch Transdermal Q24H   [START ON 02/10/2024] midodrine   10 mg Oral Q M,W,F-HD   midodrine   5 mg Oral TID WC   pantoprazole   40 mg Oral BID  AC   psyllium  1 packet Oral Daily   sevelamer  carbonate  3,200-4,000 mg Oral TID WC   sodium chloride  flush  3 mL Intravenous Q12H   Continuous Infusions:  piperacillin -tazobactam (ZOSYN )  IV Stopped (02/09/24 9358)   Diet: Diet Order             Diet renal with fluid restriction Fluid restriction: 1200 mL Fluid; Room service appropriate? Yes; Fluid consistency: Thin  Diet effective now                  Data Reviewed: I have personally reviewed following labs and imaging studies ( see epic result tab) CBC: Recent Labs  Lab 02/08/24 1201 02/09/24 0520  WBC 11.7* 12.1*  NEUTROABS 9.4*  --   HGB 11.4* 10.1*  HCT 35.3* 30.8*  MCV 91.7 89.5  PLT 223 216   CMP: Recent Labs  Lab 02/08/24 1201 02/09/24 0520  NA 135 134*  K 5.0 5.2*  CL 89* 89*  CO2 28 28  GLUCOSE 101* 141*  BUN 48* 61*  CREATININE 7.80* 9.29*  CALCIUM  10.8* 9.8   GFR: Estimated Creatinine Clearance: 9 mL/min (A) (by C-G formula based on SCr of 9.29 mg/dL (H)). No results for input(s): AST, ALT, ALKPHOS, BILITOT, PROT, ALBUMIN  in the last 168 hours.  No results for input(s): LIPASE, AMYLASE in the last 168 hours. No results for input(s): AMMONIA in the last 168 hours. Coagulation Profile: No results for input(s): INR, PROTIME in the last 168 hours. Unresulted Labs (From admission, onward)     Start     Ordered   02/10/24 0500  Hepatitis B surface antigen  (New Admission [facility] (Hepatitis B))  Tomorrow morning,   R        02/09/24 1218   02/10/24 0500  Hepatitis B surface antibody,quantitative  (New Admission [facility] (Hepatitis B))  Tomorrow morning,   R        02/09/24 1218   02/10/24 0500  Renal function panel  Tomorrow morning,   R        02/09/24 1340   02/10/24 0500  CBC  Tomorrow morning,   R        02/09/24 1341   02/09/24 1444  Hemoglobin A1c  Add-on,   AD        02/09/24 1443   02/09/24 1413  Respiratory (~20 pathogens) panel by PCR  (Respiratory panel by PCR (~20  pathogens, ~24 hr TAT)  w precautions)  Once,   R        02/09/24 1412           Antimicrobials/Microbiology: Anti-infectives (From admission, onward)    Start     Dose/Rate Route Frequency Ordered Stop   02/09/24 0600  piperacillin -tazobactam (ZOSYN ) IVPB 2.25 g        2.25 g 100 mL/hr over 30 Minutes Intravenous Every 8 hours 02/09/24 0135     02/08/24 2200  amoxicillin -clavulanate (AUGMENTIN ) 875-125 MG per tablet 1 tablet  Status:  Discontinued        1 tablet Oral Every 12 hours 02/08/24 2104 02/09/24 0135   02/08/24 2200  piperacillin -tazobactam (ZOSYN ) IVPB 3.375 g        3.375 g 100 mL/hr over 30 Minutes Intravenous  Once 02/08/24 2153 02/08/24 2314   02/08/24 0000  amoxicillin -clavulanate (AUGMENTIN ) 875-125 MG tablet        1 tablet Oral Every 12 hours 02/08/24 1908 02/18/24 2359         Component Value Date/Time   SDES BLOOD BLOOD LEFT WRIST 02/08/2024 2239   SPECREQUEST  02/08/2024 2239    BOTTLES DRAWN AEROBIC AND ANAEROBIC Blood Culture adequate volume   CULT  02/08/2024 2239    NO GROWTH < 12 HOURS Performed at [facility], [address] [place], [name]    REPTSTATUS PENDING 02/08/2024 2239    Procedures:    Mennie LAMY, MD [facility] 02/09/2024, 2:48 PM   "

## 2024-02-09 NOTE — H&P (Shared)
 Patient reports pain 9/10 pain in right hip but only has tylenol . LIP has been informed.

## 2024-02-09 NOTE — ED Notes (Signed)
 Please tell pt the son called  [name]

## 2024-02-09 NOTE — Consult Note (Signed)
  [facility] Renal Consultation Note    Indication for Consultation:  Management of ESRD/hemodialysis; anemia, hypertension/volume and secondary hyperparathyroidism   HPI: Carl Rusnak Sr. is a 63 y.o. male with ESRD on HD, HTN, PAD, h/o CVA, h/o cervical spine fusion in April 2025. He is admitted with new onset right sided weakness. Symptoms started about a week ago. Unable move right leg and has been falling at home. No acute CVA on brain MRI. Cervical spine MRI demonstrates residual mild stenosis C3-4. Neurosurgery consulted.   Labs Na 134, K 5.2, BUN 61, WBC 12.1, Hgb 10.1. Febrile on arrival temp 102.1. Blood cultures collected. Receiving antibiotics for suspected otitis media. Presented with ear pain and drainage.   Seen and examined in the ED. Progressive right sided weakness. Difficult to lift right arm, leg. He says had similar symptoms when he needed surgery in April. Denies chest pain, sob, nausea/vomiting.   HD MWF. Last dialysis Friday. He usually drives himself but son had to drive d/t weakness. He did complete full treatment. Meeting dry weight. Would be due for dialysis today per his OP clinic holiday schedule.   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    Parathyroid  abnormality    ectopic parathyroid  gland   Presence of arteriovenous fistula for hemodialysis, primary    RUE PER PT RLE   Refusal of blood product    NO WHOLE BLOOD  PROUCTS   Renal cell carcinoma (HCC)    s/p hand assisted laparoscopic bilateral nephrectomies 11/29/17, + RCC left   Secondary hyperparathyroidism    Seizures (HCC)    one episode in past, due to elevated Potassium 08/02/20- at least 4 years ago   Sleep apnea    doesn't use CPAP anymore since weight loss   Stroke [facility])    no residual   Past Surgical History:  Procedure Laterality Date   ABDOMINAL AORTOGRAM W/LOWER EXTREMITY N/A 12/09/2023   Procedure: ABDOMINAL AORTOGRAM W/LOWER EXTREMITY;  Surgeon: Sheree Penne Bruckner, MD;  Location: [facility] INVASIVE CV LAB;  Service: Cardiovascular;  Laterality: N/A;   ANTERIOR CERVICAL CORPECTOMY N/A 05/25/2023   Procedure: ANTERIOR CERVICAL CORPECTOMY CERVICAL FOUR, CERVICAL THREE - CERVICAL FIVE FUSION;  Surgeon: [name] Shove, MD;  Location: [facility] OR;  Service: Neurosurgery;  Laterality: N/A;   AV FISTULA PLACEMENT Right    right arm   BIOPSY  08/01/2019   Procedure: BIOPSY;  Surgeon: Kristie Lamprey, MD;  Location: [facility] ENDOSCOPY;  Service: Endoscopy;;   BIOPSY  11/18/2020   Procedure: BIOPSY;  Surgeon: Stacia Glendia BRAVO, MD;  Location: [facility] ENDOSCOPY;  Service: Gastroenterology;;   BIOPSY  09/13/2021   Procedure: BIOPSY;  Surgeon: Wilhelmenia Aloha Raddle., MD;  Location: [facility] ENDOSCOPY;  Service: Gastroenterology;;   CATARACT EXTRACTION W/ INTRAOCULAR LENS  IMPLANT, BILATERAL  COLON SURGERY     COLONOSCOPY N/A 08/04/2015   Procedure: COLONOSCOPY;  Surgeon: Gordy CHRISTELLA Starch, MD;  Location: [facility] ENDOSCOPY;  Service: Endoscopy;  Laterality: N/A;   COLONOSCOPY  2017   JMP@ [facility]-good prep-mass -recall 1 yr   COLONOSCOPY N/A 09/13/2021   Procedure: COLONOSCOPY;  Surgeon: Mansouraty, Aloha Raddle., MD;  Location: [facility] ENDOSCOPY;  Service: Gastroenterology;  Laterality: N/A;   COLONOSCOPY WITH PROPOFOL  N/A 11/25/2020   Procedure: COLONOSCOPY WITH PROPOFOL ;  Surgeon: Federico Rosario BROCKS, MD;  Location: [facility] ENDOSCOPY;  Service: Gastroenterology;  Laterality: N/A;   COLONOSCOPY  WITH PROPOFOL  N/A 09/23/2021   Procedure: COLONOSCOPY WITH PROPOFOL ;  Surgeon: San Sandor GAILS, DO;  Location: [facility] ENDOSCOPY;  Service: Gastroenterology;  Laterality: N/A;   ENTEROSCOPY N/A 09/23/2021   Procedure: ENTEROSCOPY;  Surgeon: San Sandor GAILS, DO;  Location: [facility] ENDOSCOPY;  Service: Gastroenterology;  Laterality: N/A;  Will place order for video capsule study as we may opt to place it during procedure   ESOPHAGOGASTRODUODENOSCOPY N/A 08/01/2019   Procedure: ESOPHAGOGASTRODUODENOSCOPY (EGD);  Surgeon: Kristie Lamprey, MD;  Location: [facility] ENDOSCOPY;  Service: Endoscopy;  Laterality: N/A;   ESOPHAGOGASTRODUODENOSCOPY N/A 11/18/2020   Procedure: ESOPHAGOGASTRODUODENOSCOPY (EGD);  Surgeon: Stacia Glendia BRAVO, MD;  Location: [facility] ENDOSCOPY;  Service: Gastroenterology;  Laterality: N/A;   ESOPHAGOGASTRODUODENOSCOPY (EGD) WITH PROPOFOL  N/A 08/04/2019   Procedure: ESOPHAGOGASTRODUODENOSCOPY (EGD) WITH PROPOFOL ;  Surgeon: Leigh Elspeth SQUIBB, MD;  Location: [facility] ENDOSCOPY;  Service: Gastroenterology;  Laterality: N/A;   ESOPHAGOGASTRODUODENOSCOPY (EGD) WITH PROPOFOL  N/A 09/13/2021   Procedure: ESOPHAGOGASTRODUODENOSCOPY (EGD) WITH PROPOFOL ;  Surgeon: Wilhelmenia Aloha Raddle., MD;  Location: [facility] ENDOSCOPY;  Service: Gastroenterology;  Laterality: N/A;   GIVENS CAPSULE STUDY N/A 09/23/2021   Procedure: GIVENS CAPSULE STUDY;  Surgeon: San Sandor GAILS, DO;  Location: [facility] ENDOSCOPY;  Service: Gastroenterology;  Laterality: N/A;   graft left arm Left    for dialysis x 2. Removed   HOT HEMOSTASIS  11/18/2020   Procedure: HOT HEMOSTASIS (ARGON PLASMA COAGULATION/BICAP);  Surgeon: Stacia Glendia BRAVO, MD;  Location: [facility] ENDOSCOPY;  Service: Gastroenterology;;   HOT HEMOSTASIS N/A 09/23/2021   Procedure: HOT HEMOSTASIS (ARGON PLASMA COAGULATION/BICAP);  Surgeon: San Sandor GAILS, DO;  Location: [facility] ENDOSCOPY;  Service: Gastroenterology;  Laterality: N/A;   INSERTION OF DIALYSIS CATHETER     Rt chest   LAPAROSCOPIC  RIGHT COLECTOMY N/A 08/05/2015   Procedure: LAPAROSCOPIC RIGHT COLECTOMY- ASCENDING;  Surgeon: Jina Nephew, MD;  Location: [facility] OR;  Service: General;  Laterality: N/A;   LOWER EXTREMITY ANGIOGRAPHY N/A 12/09/2023   Procedure: Lower Extremity Angiography;  Surgeon: Sheree Penne Bruckner, MD;  Location: [facility] INVASIVE CV LAB;  Service: Cardiovascular;  Laterality: N/A;   LOWER EXTREMITY INTERVENTION N/A 12/09/2023   Procedure: LOWER EXTREMITY INTERVENTION;  Surgeon: Sheree Penne Bruckner, MD;  Location: [facility] INVASIVE CV LAB;  Service: Cardiovascular;  Laterality: N/A;   MASS EXCISION Left 05/28/2019   Procedure: EXCISION SOFT TISSUE MASS LEFT SHOULDER;  Surgeon: Eletha Boas, MD;  Location: [facility] ORS;  Service: General;  Laterality: Left;   NEPHRECTOMY Bilateral    PARATHYROIDECTOMY N/A 06/12/2016   Procedure: TOTAL PARATHYROIDECTOMY WITH AUTOTRANSPLANTATION TO LEFT FOREARM;  Surgeon: Boas Eletha, MD;  Location: [facility] OR;  Service: General;  Laterality: N/A;   PARATHYROIDECTOMY N/A 10/23/2016   Procedure: PARATHYROIDECTOMY;  Surgeon: Eletha Boas, MD;  Location: [facility] OR;  Service: General;  Laterality: N/A;   REVERSE SHOULDER ARTHROPLASTY Right 08/24/2020   Procedure: REVERSE SHOULDER ARTHROPLASTY;  Surgeon: Cristy Bonner DASEN, MD;  Location: [facility] OR;  Service: Orthopedics;  Laterality: Right;   REVISON OF ARTERIOVENOUS FISTULA Right 07/16/2017   Procedure: REVISION OF ARTERIOVENOUS FISTULA  Right ARM;  Surgeon: Sheree Penne Bruckner, MD;  Location: [facility] OR;  Service: Vascular;  Laterality: Right;   TOTAL HIP ARTHROPLASTY Left 11/14/2020   Procedure: LEFT TOTAL HIP ARTHROPLASTY ANTERIOR APPROACH;  Surgeon: [name] Kay HERO, MD;  Location: [facility] OR;  Service: Orthopedics;  Laterality: Left;   UPPER GASTROINTESTINAL ENDOSCOPY  2021   @ [facility]   Family History  Problem Relation Age of Onset   Diabetes Father    Stroke Father    Hypertension Father    Uterine cancer Mother    Lupus Sister    Stroke Sister     Hypertension Sister    Anuerysm Brother        brain   Colon cancer Neg Hx    Esophageal cancer Neg Hx    Stomach cancer Neg Hx    Pancreatic cancer Neg Hx    Liver disease Neg Hx    Colon polyps Neg Hx    Rectal cancer Neg Hx    Social History:  reports that he quit smoking about 33 years ago. His smoking use included cigarettes. He has never used smokeless tobacco. He reports that he does not currently use alcohol after a past usage of about 1.0 standard drink of alcohol per week. He reports that he does not currently use drugs after having used the following drugs: Marijuana. Allergies[1] Prior to Admission medications  Medication Sig Start Date End Date Taking? Authorizing Provider  amoxicillin -clavulanate (AUGMENTIN ) 875-125 MG tablet Take 1 tablet by mouth every 12 (twelve) hours for 10 days. 02/08/24 02/18/24 Yes Guillermina Hamilton, MD  acetaminophen  (TYLENOL ) 325 MG tablet Take 1-2 tablets (325-650 mg total) by mouth every 4 (four) hours as needed for mild pain (pain score 1-3). 07/14/23   Love, Sharlet RAMAN, PA-C  amoxicillin  (AMOXIL ) 500 MG capsule TAKE FOUR CAPSULES BY MOUTH ONE HOUR BEFORE DENTAL WORK/PROCEDURE 01/06/24   [name] Kay HERO, MD  atorvastatin  (LIPITOR) 10 MG tablet Take 1 tablet (10 mg total) by mouth every Thursday. 05/30/23   Lue Elsie BROCKS, MD  augmented betamethasone  dipropionate (DIPROLENE -AF) 0.05 % ointment Apply 1 Application topically 2 (two) times daily as needed for rash. 08/28/21   [provider]  calcitRIOL  (ROCALTROL ) 0.5 MCG capsule Take 3 capsules (1.5 mcg total) by mouth every Monday, Wednesday, and Friday with hemodialysis. 07/15/23   Love, Sharlet RAMAN, PA-C  camphor-menthol  (SARNA) lotion Apply topically. 02/03/21   [provider]  cinacalcet  (SENSIPAR ) 60 MG tablet     [provider]  clopidogrel  (PLAVIX ) 75 MG tablet Take 1 tablet (75 mg total) by mouth daily. 12/09/23 12/08/24  Sheree Penne Bruckner, MD  cromolyn  (OPTICROM ) 4  % ophthalmic solution Place 1 drop into both eyes daily as needed (redness).    [provider]  cyclobenzaprine  (FLEXERIL ) 10 MG tablet Take 1 tablet (10 mg total) by mouth 3 (three) times daily as needed for muscle spasms. 05/30/23   Lue Elsie BROCKS, MD  diphenhydrAMINE  (BENADRYL ) 25 mg capsule Take 1 capsule (25 mg total) by mouth every 6 (six) hours as needed for itching. 07/14/23   Love, Sharlet RAMAN, PA-C  gabapentin  (NEURONTIN ) 100 MG capsule Take 3 capsules (300 mg total) by mouth at bedtime. 05/30/23   Lue Elsie BROCKS, MD  ibuprofen (ADVIL) 800 MG tablet SMARTSIG:1 Tablet(s) By Mouth Every 12 Hours PRN 02/15/23   [provider]  lidocaine  (LIDODERM ) 5 %  Place 1 patch onto the skin daily. Remove & Discard patch within 12 hours 02/07/24   Hermanns, Ashlee P, PA-C  LOKELMA  10 g PACK packet Take 1 packet by mouth daily. 05/16/21   [provider]  melatonin 5 MG TABS Take 1 tablet (5 mg total) by mouth at bedtime as needed. 07/14/23   Love, Sharlet RAMAN, PA-C  Methoxy PEG-Epoetin  Beta (MIRCERA IJ) 75 mcg. 07/22/23 07/20/24  [provider]  midodrine  (PROAMATINE ) 10 MG tablet Take 1 tablet (10 mg total) by mouth every dialysis (30 min before each dialysis.). 11/15/23   Lovorn, Megan, MD  multivitamin (RENA-VIT) TABS tablet Take 1 tablet by mouth daily.    [provider]  Nystatin (GERHARDT'S BUTT CREAM) CREA Apply 1 Application topically 2 (two) times daily. 07/15/23   Love, Sharlet RAMAN, PA-C  ofloxacin  (FLOXIN ) 0.3 % OTIC solution Place 10 drops into the left ear daily for 7 days. 02/07/24 02/14/24  Hermanns, Ashlee P, PA-C  oxyCODONE -acetaminophen  (PERCOCET/ROXICET) 5-325 MG tablet Take 1-2 tablets by mouth every 6 (six) hours as needed for severe pain (pain score 7-10) (5mg  for moderate pain and 10 mg for severe pain). 07/15/23   Love, Sharlet RAMAN, PA-C  pantoprazole  (PROTONIX ) 40 MG tablet Take 1 tablet (40 mg total) by mouth 2 (two) times daily before a meal. Patient  taking differently: Take 40 mg by mouth daily as needed (for acid reflux). 09/13/21   Singh, Prashant K, MD  pentafluoroprop-tetrafluoroeth JUANA) AERO Apply 1 Application topically as needed (topical anesthesia for hemodialysis). 07/15/23   Love, Sharlet RAMAN, PA-C  polyethylene glycol powder (GLYCOLAX /MIRALAX ) 17 GM/SCOOP powder Take 17 g by mouth 2 (two) times daily as needed for mild constipation. 07/14/23   Love, Sharlet RAMAN, PA-C  predniSONE  (DELTASONE ) 10 MG tablet Take by mouth. 11/07/23   [provider]  psyllium (HYDROCIL/METAMUCIL) 95 % PACK Take 1 packet by mouth 3 (three) times daily. 07/15/23   Love, Sharlet RAMAN, PA-C  senna-docusate (SENOKOT-S) 8.6-50 MG tablet Take 2 tablets by mouth 2 (two) times daily. 05/30/23   Lue Elsie BROCKS, MD  sevelamer  carbonate (RENVELA ) 800 MG tablet Take 3,200-4,000 mg by mouth 3 (three) times daily with meals. 06/12/21   [provider]  topiramate  (TOPAMAX ) 25 MG tablet Take 1 tablet (25 mg total) by mouth 2 (two) times daily. 07/15/23   Maurice Sharlet RAMAN, PA-C   Current Facility-Administered Medications  Medication Dose Route Frequency Provider Last Rate Last Admin   acetaminophen  (TYLENOL ) tablet 650 mg  650 mg Oral Q6H PRN Bryn Bernardino NOVAK, MD   650 mg at 02/09/24 0555   Or   acetaminophen  (TYLENOL ) suppository 650 mg  650 mg Rectal Q6H PRN Bryn Bernardino NOVAK, MD       aspirin  EC tablet 81 mg  81 mg Oral Daily Bryn Bernardino NOVAK, MD   81 mg at 02/09/24 1049   atorvastatin  (LIPITOR) tablet 10 mg  10 mg Oral Daily Grunz, Ryan B, MD   10 mg at 02/09/24 1049   [START ON 02/10/2024] calcitRIOL  (ROCALTROL ) capsule 1.5 mcg  1.5 mcg Oral Q M,W,F-HD Kc, Ramesh, MD       ciprofloxacin -dexamethasone  (CIPRODEX ) 0.3-0.1 % OTIC (EAR) suspension 4 drop  4 drop Left EAR BID Guillermina Hamilton, MD   4 drop at 02/09/24 1049   clopidogrel  (PLAVIX ) tablet 75 mg  75 mg Oral Daily Grunz, Ryan B, MD   75 mg at 02/09/24 1049   gabapentin  (NEURONTIN ) capsule 300 mg  300 mg  Oral QHS Christobal Guadalajara, MD       heparin  injection 5,000 Units  5,000 Units Subcutaneous Q8H Bryn Bernardino NOVAK, MD   5,000 Units at 02/09/24 0618   lidocaine  (LIDODERM ) 5 % 1 patch  1 patch Transdermal Q24H Glendia Rocky SAILOR, PA-C   1 patch at 02/08/24 1214   melatonin tablet 5 mg  5 mg Oral QHS PRN Christobal Guadalajara, MD       midodrine  (PROAMATINE ) tablet 5 mg  5 mg Oral TID WC Kc, Ramesh, MD   5 mg at 02/09/24 1049   pantoprazole  (PROTONIX ) EC tablet 40 mg  40 mg Oral BID AC Kc, Guadalajara, MD   40 mg at 02/09/24 1048   piperacillin -tazobactam (ZOSYN ) IVPB 2.25 g  2.25 g Intravenous Q8H Clair Lynwood CROME, RPH   Stopped at 02/09/24 9358   psyllium (HYDROCIL/METAMUCIL) 1 packet  1 packet Oral Daily Guillermina Glendia, MD   1 packet at 02/09/24 1048   sevelamer  carbonate (RENVELA ) tablet 3,200-4,000 mg  3,200-4,000 mg Oral TID WC Kc, Guadal[place]ara, MD       sodium chloride  flush (NS) 0.9 % injection 3 mL  3 mL Intravenous Q12H Bryn Bernardino NOVAK, MD   3 mL at 02/09/24 1049   Current Outpatient Medications  Medication Sig Dispense Refill   amoxicillin -clavulanate (AUGMENTIN ) 875-125 MG tablet Take 1 tablet by mouth every 12 (twelve) hours for 10 days. 20 tablet 0   acetaminophen  (TYLENOL ) 325 MG tablet Take 1-2 tablets (325-650 mg total) by mouth every 4 (four) hours as needed for mild pain (pain score 1-3).     amoxicillin  (AMOXIL ) 500 MG capsule TAKE FOUR CAPSULES BY MOUTH ONE HOUR BEFORE DENTAL WORK/PROCEDURE 8 capsule 0   atorvastatin  (LIPITOR) 10 MG tablet Take 1 tablet (10 mg total) by mouth every Thursday. 30 tablet 2   augmented betamethasone  dipropionate (DIPROLENE -AF) 0.05 % ointment Apply 1 Application topically 2 (two) times daily as needed for rash.     calcitRIOL  (ROCALTROL ) 0.5 MCG capsule Take 3 capsules (1.5 mcg total) by mouth every Monday, Wednesday, and Friday with hemodialysis.     camphor-menthol  (SARNA) lotion Apply topically.     cinacalcet  (SENSIPAR ) 60 MG tablet      clopidogrel  (PLAVIX ) 75 MG tablet Take 1 tablet (75 mg  total) by mouth daily. 30 tablet 11   cromolyn  (OPTICROM ) 4 % ophthalmic solution Place 1 drop into both eyes daily as needed (redness).     cyclobenzaprine  (FLEXERIL ) 10 MG tablet Take 1 tablet (10 mg total) by mouth 3 (three) times daily as needed for muscle spasms. 30 tablet 0   diphenhydrAMINE  (BENADRYL ) 25 mg capsule Take 1 capsule (25 mg total) by mouth every 6 (six) hours as needed for itching.     gabapentin  (NEURONTIN ) 100 MG capsule Take 3 capsules (300 mg total) by mouth at bedtime. 30 capsule 2   ibuprofen (ADVIL) 800 MG tablet SMARTSIG:1 Tablet(s) By Mouth Every 12 Hours PRN     lidocaine  (LIDODERM ) 5 % Place 1 patch onto the skin daily. Remove & Discard patch within 12 hours 30 patch 0   LOKELMA  10 g PACK packet Take 1 packet by mouth daily.     melatonin 5 MG TABS Take 1 tablet (5 mg total) by mouth at bedtime as needed.     Methoxy PEG-Epoetin  Beta (MIRCERA IJ) 75 mcg.     midodrine  (PROAMATINE ) 10 MG tablet Take 1 tablet (10 mg total) by mouth every dialysis (30 min before each dialysis.).  15 tablet 5   multivitamin (RENA-VIT) TABS tablet Take 1 tablet by mouth daily.     Nystatin (GERHARDT'S BUTT CREAM) CREA Apply 1 Application topically 2 (two) times daily.     ofloxacin  (FLOXIN ) 0.3 % OTIC solution Place 10 drops into the left ear daily for 7 days. 10 mL 0   oxyCODONE -acetaminophen  (PERCOCET/ROXICET) 5-325 MG tablet Take 1-2 tablets by mouth every 6 (six) hours as needed for severe pain (pain score 7-10) (5mg  for moderate pain and 10 mg for severe pain). 20 tablet 0   pantoprazole  (PROTONIX ) 40 MG tablet Take 1 tablet (40 mg total) by mouth 2 (two) times daily before a meal. (Patient taking differently: Take 40 mg by mouth daily as needed (for acid reflux).) 60 tablet 1   pentafluoroprop-tetrafluoroeth (GEBAUERS) AERO Apply 1 Application topically as needed (topical anesthesia for hemodialysis).     polyethylene glycol powder (GLYCOLAX /MIRALAX ) 17 GM/SCOOP powder Take 17 g by  mouth 2 (two) times daily as needed for mild constipation.     predniSONE  (DELTASONE ) 10 MG tablet Take by mouth.     psyllium (HYDROCIL/METAMUCIL) 95 % PACK Take 1 packet by mouth 3 (three) times daily.     senna-docusate (SENOKOT-S) 8.6-50 MG tablet Take 2 tablets by mouth 2 (two) times daily. 20 tablet 0   sevelamer  carbonate (RENVELA ) 800 MG tablet Take 3,200-4,000 mg by mouth 3 (three) times daily with meals.     topiramate  (TOPAMAX ) 25 MG tablet Take 1 tablet (25 mg total) by mouth 2 (two) times daily.      ROS: As per HPI otherwise negative.  Physical Exam: Vitals:   02/09/24 0531 02/09/24 0548 02/09/24 0939 02/09/24 0959  BP:   (!) 83/48 (!) 92/58  Pulse:   92   Resp:   14   Temp: 99.8 F (37.7 C)  99 F (37.2 C)   TempSrc: Oral  Oral   SpO2:   100%   Weight:  87.6 kg    Height:  6' 1 (1.854 m)       General: Appears comfortable, in no distress  Head: NCAT sclera not icteric  CV: Regular rate, no murmur  Pulm: normal respiratory effort, lungs clear  Abdomen: non-tender, no masses  Lower extremities: no edema  Neuro: A & O X 3. Right sided weakness. Decreased R grip strength  Psych:  Responds to questions appropriately with a normal affect. Dialysis Access: RUE AVF +bruit   Labs: Basic Metabolic Panel: Recent Labs  Lab 02/08/24 1201 02/09/24 0520  NA 135 134*  K 5.0 5.2*  CL 89* 89*  CO2 28 28  GLUCOSE 101* 141*  BUN 48* 61*  CREATININE 7.80* 9.29*  CALCIUM  10.8* 9.8   Liver Function Tests: No results for input(s): AST, ALT, ALKPHOS, BILITOT, PROT, ALBUMIN  in the last 168 hours. No results for input(s): LIPASE, AMYLASE in the last 168 hours. No results for input(s): AMMONIA in the last 168 hours. CBC: Recent Labs  Lab 02/08/24 1201 02/09/24 0520  WBC 11.7* 12.1*  NEUTROABS 9.4*  --   HGB 11.4* 10.1*  HCT 35.3* 30.8*  MCV 91.7 89.5  PLT 223 216   Cardiac Enzymes: No results for input(s): CKTOTAL, CKMB, CKMBINDEX,  TROPONINI in the last 168 hours. CBG: No results for input(s): GLUCAP in the last 168 hours. Iron Studies: No results for input(s): IRON, TIBC, TRANSFERRIN, FERRITIN in the last 72 hours. Studies/Results: MR Cervical Spine Wo Contrast Result Date: 02/08/2024 EXAM: MRI CERVICAL SPINE WITHOUT CONTRAST 02/08/2024  06:05:08 PM TECHNIQUE: Multiplanar multisequence MRI of the cervical spine was performed. COMPARISON: None available. CLINICAL HISTORY: Ataxia, nontraumatic, cervical pathology suspected FINDINGS: BONES AND ALIGNMENT: C3-C5 ACDF with C4 corpectomy. Normal alignment. Edema surrounding the C5-C6 disc is likely degenerative/discogenic. RIght C4-C5 perifact edema, also likely degenerative. Otherwise, Bone marrow signal is unremarkable. SPINAL CORD: Normal spinal cord size. No abnormal spinal cord signal. SOFT TISSUES: No paraspinal mass. C2-C3: Ligamentum flavum thickening and mild facet arthropathy. Resulting mild to moderate canal stenosis. C3-C4: ACDF. Ligamentum flavum thickening and bilateral facet arthropathy. Mild to moderate canal stenosis. Moderate bilateral foraminal stenosis. C4-C5: ACDF. C4 corpectomy. Facter athropathy greater on the right where there also is perifacet edema. Motion limited assessment with widely patent canal and likely patent foramina. C5-C6: Small posterior disc osteophyte complex . Bilateral facet and uncovertebral hypertrophy. Motion-limited assessment with suspected moderate left foraminal stenosis. Patent canal and right foramen. C6-C7: Right .in the left facet and uncovertebral hypertrophy. Right foraminal stenosis. Patent canal and left foramen. C7-T1: No significant disc herniation. No spinal canal stenosis or neural foraminal narrowing. IMPRESSION: 1. C3-C5 ACDF with C4 corpectomy.  Limited evaluation of hardware by MRI. 2. Motion limited study with suspected moderate foraminal stenosis bilaterally at C3-C4 and on the left at C5-C6. Mild to moderate canal  stenosis at C2-C3 and C3-C4. 3. RIght C4-C5 perifacet and C5-C6 discogenic endplate edema, likely degenerative in etiology. Electronically signed by: Gilmore Molt 02/08/2024 08:16 PM EST RP Workstation: [id number]   MR BRAIN WO CONTRAST Result Date: 02/08/2024 EXAM: MRI BRAIN WITHOUT CONTRAST 02/08/2024 05:44:06 PM TECHNIQUE: Multiplanar multisequence MRI of the head/brain was performed without the administration of intravenous contrast. COMPARISON: Same day CT head and comparison MRI brain 05/25/2023. CLINICAL HISTORY: Ataxia, head trauma. FINDINGS: BRAIN AND VENTRICLES: Similar appearance of extensive dural calcifications. T2 and FLAIR hyperintensity in the periventricular and subcortical white matter compatible with mild chronic microvascular ischemic changes. There is a small remote infarct in the left frontal periventricular white matter which is similar to prior. Lacunar infarcts in the left basal ganglia. Remote infarct in the left occipital lobe. No acute infarct. No intracranial hemorrhage. No mass. No midline shift. No hydrocephalus. The sella is unremarkable. Normal flow voids. ORBITS: Bilateral lens replacements. 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 Large bilateral mastoid effusions. Mucosal thickening in the right maxillary sinus. BONES AND SOFT TISSUES: Subtle right frontal scalp soft tissue swelling better evaluated on the same day CT. Partially visualized cervical fusion hardware. Normal marrow signal. IMPRESSION: 1. No acute intracranial abnormality related to the head trauma. 2. Large bilateral mastoid effusions. 3. Small remote infarct in the left frontal periventricular white matter and remote lacunar infarcts in the left basal ganglia. 4. Remote infarct in the left occipital lobe. Electronically signed by: Donnice Mania MD 02/08/2024 06:59 PM EST RP Workstation: [id number]   CT Head Wo Contrast Result Date: 02/08/2024 CLINICAL DATA:  Status  post trauma. EXAM: CT HEAD WITHOUT CONTRAST TECHNIQUE: Contiguous axial images were obtained from the base of the skull through the vertex without intravenous contrast. RADIATION DOSE REDUCTION: This exam was performed according to the departmental dose-optimization program which includes automated exposure control, adjustment of the mA and/or kV according to patient size and/or use of iterative reconstruction technique. COMPARISON:  June 09, 2023 FINDINGS: Brain: No evidence of acute infarction, hemorrhage, hydrocephalus, extra-axial collection or mass lesion/mass effect. Stable marked severity pachymeningeal calcification is noted. There is a small chronic left occipital lobe infarct. A chronic left basal  gangliar infarct is also seen. Vascular: Marked severity bilateral cavernous carotid artery calcification is noted. Skull: Normal. Negative for fracture or focal lesion. Sinuses/Orbits: No acute finding. Other: There is mild right frontal scalp soft tissue swelling. IMPRESSION: 1. No acute intracranial abnormality. 2. Small chronic left occipital lobe infarct. 3. Chronic left basal gangliar infarct. 4. Mild right frontal scalp soft tissue swelling. Electronically Signed   By: Suzen Dials M.D.   On: 02/08/2024 17:57   DG Hip Unilat W or Wo Pelvis 2-3 Views Right Result Date: 02/08/2024 EXAM: 2 OR MORE VIEW(S) XRAY OF THE BILATERAL HIP 02/08/2024 12:45:00 PM COMPARISON: None available. CLINICAL HISTORY: fall, R sided pain/soreness FINDINGS: BONES AND JOINTS: Left Hip arthroplasty in place. Large subcortical cysts at the Right Femoral Neck and Head. Mild degenerative changes of the Right Hip. No acute fracture. No malalignment. SOFT TISSUES: Right lower quadrant surgical clips noted. Extensive vascular calcifications. IMPRESSION: 1. No acute fracture or dislocation. 2. Large subcortical cysts at the right femoral neck and head. 3. Left hip arthroplasty in place. Electronically signed by: Morgane Naveau MD  02/08/2024 01:48 PM EST RP Workstation: [id number]   DG Shoulder 1 View Right Result Date: 02/08/2024 CLINICAL DATA:  Right shoulder pain after a fall. EXAM: RIGHT SHOULDER - 1 VIEW COMPARISON:  09/12/2023 FINDINGS: A single view of the right shoulder shows the patient to be status post reverse arthroplasty. No hardware complication evident on this single film. No obvious shoulder dislocation. IMPRESSION: Status post reverse arthroplasty without complicating features. Electronically Signed   By: Camellia Candle M.D.   On: 02/08/2024 08:38   DG Ribs Unilateral W/Chest Right Result Date: 02/07/2024 EXAM: AP VIEW XRAY OF THE RIGHT RIBS AND CHEST 02/07/2024 02:45:02 PM COMPARISON: None available. CLINICAL HISTORY: right shoulder pain, h/o fall FINDINGS: BONES: Reverse right shoulder arthroplasty. No acute displaced rib fracture. LUNGS AND PLEURA: Linear atelectasis or scarring at left lung base. No pleural effusion or pneumothorax. HEART AND MEDIASTINUM: Calcified aorta. No acute abnormality of the cardiac and mediastinal silhouettes. IMPRESSION: 1. No acute rib fracture.Please note, nondisplaced rib fracture may be occult on radiograph and is not excluded. 2. No acute cardiopulmonary abnormality. Electronically signed by: Morgane Naveau MD 02/07/2024 03:25 PM EST RP Workstation: [id number]   DG Cervical Spine 2-3 Views Result Date: 02/07/2024 EXAM: 2 or 3 VIEW(S) XRAY OF THE CERVICAL SPINE 02/07/2024 02:44:49 PM COMPARISON: X-ray cervical spine from 03/22/2023. CLINICAL HISTORY: right shoulder pain, h/o fall FINDINGS: LIMITATIONS: Limited evaluation due to overlapping osseous structures and overlying soft tissues. On the lateral view of the C6 and c7 vertebral bodies are not visualized. BONES: Vertebral body heights are maintained. Alignment is normal. C3 to C5 discectomy and fusion that appears similar to prior. DISCS AND DEGENERATIVE CHANGES: Multilevel at least mild degenerative changes of the spine. SOFT  TISSUES: Atherosclerotic plaque. No prevertebral soft tissue swelling. The visualized lungs appear clear. IMPRESSION: 1. No acute osseous abnormality.Limited evaluation due to overlapping osseous structures and overlying soft tissues. 2. C3 to C5 discectomy and fusion that appears similar to prior. Electronically signed by: Morgane Naveau MD 02/07/2024 03:23 PM EST RP Workstation: [id number]    Dialysis Orders:  Unit: NW Schedule: MWF Time: 4:00 EDW: 87.3 Flows: 400/A1.5 Bath: 2K/2CA Access: AVF Heparin : 3000 ESA: Mircera 50 q 2 wks (last 12/15) VDRA: Calcitriol  2 mcg tiw, cinacalcet  120 mg daily   Assessment/Plan: R sided weakness. H/o cervical spine injury/fusion. No acute CVA on MRI. Neurosurgery consulting.  Fevers/ear pain. Blood Cx pending. Antibiotics  per primary team.  ESRD.  HD MWF.  No urgent HD indications today. Continue usual schedule in hospital. HD Monday  Hyperkalemia. Mild. Lokelma  on non HD days. Will order today..  Hypertension. BP soft. On midodrine  10mg  with HD.  Volume. Appears euvolemic.  Anemia. Hgb stable  Metabolic bone disease.  Continue sevelamer  binder  Maisie Ronnald Acosta PA-C [facility] 02/09/2024, 11:04 AM      [1]  Allergies Allergen Reactions   Infed [Iron Dextran] Other (See Comments)    Decreased BP    Vicodin [Hydrocodone -Acetaminophen ] Other (See Comments)    Decreased BP   Hydrocodone     Diclofenac Other (See Comments)    Caused hypotension

## 2024-02-09 NOTE — Evaluation (Signed)
 Physical Therapy Evaluation Patient Details Name: Carl Highbaugh Sr. MRN: [id number] DOB: [birth year] Today's Date: 02/09/2024  History of Present Illness  Pt is a 63 y.o. male who presented 02/07/24 with progressive R-sided weakness and pain along with L ear pain and drainage. Pt septic with L acute otitis media. MRI brain showed no acute findings. MRI cervical spine demonstrated no interval change from prior surgery and no clear etiology for current weakness. Imaging did note prior left-sided strokes, without evidence of acute stroke or recrudescence. Neurosurgery consulted and recommends no acute intervention. R weakness suspected recrudescence of prior symptoms in setting of severe infection. PMH includes: recurrent GIB, epilepsy, PRES, TIA, HTN, renal cell carcinoma, CVA, OSA, ESRD, DM II, Ischemic colitis, anterior cervical corpectomy C4 and C3-C5 fusion (Dr. [name], 05/25/2023), PAD s/p angioplasty/stenting right SFA and popliteal arteries 12/09/2023 for critical limb ischemia   Clinical Impression  Pt presents with condition above and deficits mentioned below, see PT Problem List. PTA, he was mod I using a RW for functional mobility, living with his son in a 1-level apartment with a level entry. He reports his R arm and leg weakness began simultaneously a few weeks ago and does not recall any mechanism of injury. Pt has since had several falls due to his R knee buckling. Per neuro note yesterday, pt had 4- strength in his R side. Today he is demonstrating </= 2/5 R hip flexion, 2+/5 R quadriceps, and 4/5 R anterior tibialis strength. While different personal may test and rate strength differently, this drastic decline seems concerning. He also noted diminished sensation throughout the R leg. Pt also has had a quick drastic functional decline. He is now requiring maxA to transfer to stand and take a couple side steps along EOB with RW support, displaying frequent R knee buckling. Whereas, a few days ago  he was ambulatory mod I with RW. Notified MD of these concerns. The pt is at high risk for falls and could greatly benefit from intensive inpatient rehab, > 3 hours/day, if he can qualify. He is very motivated to participate and improve. Will continue to follow acutely. Of note, pt has a raised dry area of skin at the top of his head that he reports itches. Notified MD as well of this.       If plan is discharge home, recommend the following: Two people to help with walking and/or transfers;Two people to help with bathing/dressing/bathroom;Assistance with cooking/housework;Assist for transportation   Can travel by private vehicle        Equipment Recommendations BSC/3in1;Hospital bed;Hoyer lift (pending progress)  Recommendations for Other Services  Rehab consult    Functional Status Assessment Patient has had a recent decline in their functional status and demonstrates the ability to make significant improvements in function in a reasonable and predictable amount of time.     Precautions / Restrictions Precautions Precautions: Fall;Other (comment) Precaution/Restrictions Comments: watch HR, R knee buckles Restrictions Weight Bearing Restrictions Per Provider Order: No      Mobility  Bed Mobility Overal bed mobility: Needs Assistance Bed Mobility: Supine to Sit, Sit to Supine     Supine to sit: Mod assist, HOB elevated Sit to supine: Mod assist, HOB elevated   General bed mobility comments: ModA needed to ascend trunk to sit edge of stretcher. ModA needed to lift legs and direct trunk back to supine. Pt attempting to move legs but had difficulty managing his R, needing assistance throughout    Transfers Overall transfer level: Needs assistance Equipment  used: Rolling walker (2 wheels) Transfers: Sit to/from Stand Sit to Stand: Max assist, From elevated surface           General transfer comment: R knee blocked and hand-over-hand provided to lift and place and keep R  hand on RW. Pt stood from elevated edge of stretcher with maxA to power up, extend hips and trunk, and gain balance. Cues provided for pt to push through L arm to lift chest    Ambulation/Gait Ambulation/Gait assistance: Max assist Gait Distance (Feet): 2 Feet Assistive device: Rolling walker (2 wheels) Gait Pattern/deviations: Step-to pattern, Decreased step length - right, Decreased step length - left, Decreased stride length, Knees buckling, Trunk flexed Gait velocity: reduced Gait velocity interpretation: <1.31 ft/sec, indicative of household ambulator   General Gait Details: Pt with flexed trunk and hips and R knee buckling frequently, needing blocking and tactile and verbal cues to extend all. MaxA needed for balance and to cue pt to step to R along EOB and bring RW with him. Pt unable to tolerate ambulating further due to R leg weakness  Stairs            Wheelchair Mobility     Tilt Bed    Modified Rankin (Stroke Patients Only) Modified Rankin (Stroke Patients Only) Pre-Morbid Rankin Score: Slight disability Modified Rankin: Moderately severe disability     Balance Overall balance assessment: Needs assistance Sitting-balance support: No upper extremity supported, Feet supported Sitting balance-Leahy Scale: Fair Sitting balance - Comments: static sitting with poor core strength, flexing anteriorly and laterally to L, but no LOB, CGA for safety. MinA for dynamic sitting   Standing balance support: Bilateral upper extremity supported, During functional activity, Reliant on assistive device for balance Standing balance-Leahy Scale: Poor Standing balance comment: reliant on RW and maxA, R knee buckling and trunk flexed                             Pertinent Vitals/Pain Pain Assessment Pain Assessment: Faces Faces Pain Scale: Hurts little more Pain Location: R lower back (reproduced with palpation of muscle) Pain Descriptors / Indicators: Discomfort,  Grimacing, Sore Pain Intervention(s): Limited activity within patient's tolerance, Monitored during session, Repositioned    Home Living Family/patient expects to be discharged to:: Private residence Living Arrangements: Children (son) Available Help at Discharge: Family;Available 24 hours/day Type of Home: Apartment Home Access: Level entry       Home Layout: One level Home Equipment: Rolling Walker (2 wheels);Grab bars - tub/shower;Toilet riser;Wheelchair - manual      Prior Function Prior Level of Function : Independent/Modified Independent             Mobility Comments: Mod I using RW; x3 falls in past month due to R knee buckling ADLs Comments: Mod I ADLs, was driving but difficulty driving lately due to R leg not functioning properly     Extremity/Trunk Assessment   Upper Extremity Assessment Upper Extremity Assessment: Defer to OT evaluation;RUE deficits/detail RUE Deficits / Details: R UE functionally weaker than L    Lower Extremity Assessment Lower Extremity Assessment: RLE deficits/detail;Generalized weakness RLE Deficits / Details: </= 2/5 R hip flexion, 2+/5 R quadriceps, and 4/5 R anterior tibialis with MMT; Diminished sensation was noted throughout the R leg    Cervical / Trunk Assessment Cervical / Trunk Assessment: Other exceptions Cervical / Trunk Exceptions: poor core strength, sitting resting with trunk flexed anteriorly and laterally to L  Communication  Communication Communication: No apparent difficulties    Cognition Arousal: Alert Behavior During Therapy: WFL for tasks assessed/performed   PT - Cognitive impairments: No apparent impairments                       PT - Cognition Comments: Pt understandably intermittently emotional due to his current condition Following commands: Intact       Cueing Cueing Techniques: Verbal cues, Tactile cues     General Comments General comments (skin integrity, edema, etc.): HR reportedly  jumped up to 150s briefly but otherwise stayed stable in 100s-110s    Exercises     Assessment/Plan    PT Assessment Patient needs continued PT services  PT Problem List Decreased strength;Decreased activity tolerance;Decreased balance;Decreased mobility       PT Treatment Interventions DME instruction;Gait training;Functional mobility training;Therapeutic activities;Therapeutic exercise;Balance training;Neuromuscular re-education;Patient/family education;Wheelchair mobility training    PT Goals (Current goals can be found in the Care Plan section)  Acute Rehab PT Goals Patient Stated Goal: to improve PT Goal Formulation: With patient Time For Goal Achievement: 02/23/24 Potential to Achieve Goals: Fair    Frequency Min 3X/week     Co-evaluation               AM-PAC PT 6 Clicks Mobility  Outcome Measure Help needed turning from your back to your side while in a flat bed without using bedrails?: A Lot Help needed moving from lying on your back to sitting on the side of a flat bed without using bedrails?: A Lot Help needed moving to and from a bed to a chair (including a wheelchair)?: A Lot Help needed standing up from a chair using your arms (e.g., wheelchair or bedside chair)?: A Lot Help needed to walk in hospital room?: Total Help needed climbing 3-5 steps with a railing? : Total 6 Click Score: 10    End of Session Equipment Utilized During Treatment: Gait belt Activity Tolerance: Patient tolerated treatment well Patient left: in bed;with nursing/sitter in room (RN moving pt to hall) Nurse Communication: Other (comment) (notified MD of concerning quick decline) PT Visit Diagnosis: Unsteadiness on feet (R26.81);Other abnormalities of gait and mobility (R26.89);Muscle weakness (generalized) (M62.81);History of falling (Z91.81);Difficulty in walking, not elsewhere classified (R26.2);Repeated falls (R29.6);Other symptoms and signs involving the nervous system  (R29.898)    Time: 9165-9092 PT Time Calculation (min) (ACUTE ONLY): 33 min   Charges:   PT Evaluation $PT Eval Moderate Complexity: 1 Mod PT Treatments $Therapeutic Activity: 8-22 mins PT General Charges $$ ACUTE PT VISIT: 1 Visit         Theo Ferretti, PT, DPT Acute Rehabilitation Services  Office: [phone]   Theo CHRISTELLA Ferretti 02/09/2024, 9:28 AM

## 2024-02-09 NOTE — Hospital Course (Addendum)
 Carl Carstarphen Sr. is a 63 y.o. male with PMH of anterior cervical corpectomy C4 and C3-C5 fusion (Dr. [name], 05/25/2023), PAD s/p angioplasty/stenting right SFA and popliteal arteries 12/09/2023 for critical limb ischemia, ESRD, and more presenting with progressive right-sided weakness and pain x 1 wk. patient reports right leg acting funny about 1 wk w/ pain and weakness, worsening over the past several days.He also reports progressive right arm and shoulder weakness, now unable to lift the arm overhead.The left arm and leg are unaffected. Patient had multiple falls at home. also complains of left ear pain and drainage despite recent cerumen disimpaction earlier in the week and subsequent use of ear drops. Seen at [facility] 12[facility]26 X-rays reportedly without acute findings  and was started on oral antibiotics In ED: Vitals with mild tachycardia fever, labs with mild leukocytosis potassium 5.0 influenza COVID-negative developed a fever felt most consistent with left otitis media.Primary concern was worsening neurologic deficits in the setting of prior cervical spine surgery. CT head and hip X-rays were unremarkable MRI brain>> no acute findings  MRI cervical spine >>demonstrated no interval change from prior surgery and no clear etiology for current weakness. Imaging did note prior left-sided strokes, without evidence of acute stroke or recrudescence. Neurosurgery consulted and Dr. Colon recommends no acute intervention, with plan to continue following as inpatient. Neurology and ENT was also consulted-underwent CT temporal bone with and without contrast along with MRI shoulder T-spine L-spine. Patient was also seen by orthopedic on consultation-he was told he will need a right shoulder surgery reason and also knee replacement for his osteoarthritis. Currently working on rehab placement  Subjective: Seen and examined  No new complaints. No new wekaness Painful rt shoulder and rt knee Son at  bedside Overnight remains low-grade temp, BP intermittently soft 80s-90s Electrolytes remains stable with ESRD picture   Assessment and plan:  Sepsis POA Left suppurative otitis media with suspected TM perforation: Sepsis POA likely source as otitis media, no respiratory symptoms COVID influenza negative, RVP panel negative Blood culture NGTD. ENT [name] consulted and s/p CT temporal bone w/ and wo-no evidence of coalescent mastoiditis, + left moderate mastoid effusion, right mild mastoid effusion, no tegmen dehiscence no abscess on imaging, has right EAC cerumen impaction.  For now continue on Zosyn  and plan to discharge on Augmentin  to complete 10 days course total, continue Ciprodex  dropsbid  Follow-up with ENT outpatient  ESRD on HD MWF Mild hyperkalemia Metabolic bone disease: Nephro following.  Continue HD as per schedule.  Continue home Renvela  and calcitriol  sensipar .  Monitor electrolytes  Worsening weakness on the right side with multiple falls at home S/P cervical decompression with a corpectomy at C4 by Dr. [name] in April Remote left occipital infarct Right shoulder pain-previous arthroplasty, right knee osteoarthritis: Postsurgery was able to walk w/ walker but presents today with right-sided weakness for several days-week. Underwent extensive evaluation-seen by neurology, neurosurgery, orthopedic, and had multiple imagings as below MRI brain/MRI C-spine  MRI T, L-spine : some edema T11-12-cannot rule out small endplate fracture- but CT thoracic spine w/o 12/29- DJD cahnges no fracture. diffuse sclerosis of the vertebral bodies moderate kyphosis and moderate dextroscoliosis. Also some cervical narrowing unclear but does not appear to be significant enough to cause symptoms per neurosurgery. No cord compression I MRI  Per Dr. Verdell. Onetha -no indication for any neurosurgical intervention Per neuro If he has worsening deficits in a few weeks EMG/NCS could be considered  as outpatient. He has painful shoulder joint, elbow area  and right/left knee OA -limiting his mobility to some extent.  Knee x-ray showed osteoarthritis Seen by Ortho team-at this time no indication for surgery advise outpatient follow-up. 12/30 rehab MD evaluated and also discussed with Dr. Venessa weakness from combination of recurdescence int the setting of his ear infection along with his orthopedic issues on Rt shoulder and knees. Cont PT OT and awaiting for CIR.Cont multimodal pain management.  Shoulder pain S/P right reverse shoulder arthroplasty 7/20022 with Dr. Cristy: Nondisplaced fracture, fluid collection Likely postop in the posterior subacromial/subdeltoid space. Severe fatty atrophy of the subscapularis supraspinatus and infraspinatus muscle, cont ptot,pain control.  Likely needs surgery on the right shoulder as outpatient-orthopedic will follow-up as outpatient.  Bilateral knee pain Severe left osteoarthritis Left total hip arthroplasty October/20022 Dr. [name]: Continue multimodal pain management.follow-up with orthopedic as outpatient   PAD S/p angio w/ shockwave lithotripsy,balloon angioplasty,stenting of the right SFA and popliteal 12/09/23:  Continue home aspirin , Plavix  and statin. Doppler pulses brisk and intact on Rt LE.  GERD: Continue PPI  Essential hypertension Hypotension: BP is soft he takes midodrine  predialysis. Now placed on tid 10 mg  T2DM w/ diabetic peripheral angiopathy without gangrene and HLD: Blood sugar controlled.  Last A1c normal in 2022.  Mobility: PT Orders: Active PT Follow up Rec: Acute Inpatient Rehab (3hours/Day)02/11/2024 1306   DVT prophylaxis: heparin  injection 5,000 Units Start: 02/09/24 0600 Code Status:   Code Status: Full Code Family Communication: plan of care discussed with patient at bedside. Patient status is: Remains hospitalized because of severity of illness Level of care: Telemetry   Dispo: The patient is  from: home            Anticipated disposition: TBD Objective: Vitals last 24 hrs: Vitals:   02/12/24 0009 02/12/24 0113 02/12/24 0428 02/12/24 0500  BP:  90/77 (!) 95/52   Pulse:  87 72   Resp:   20   Temp:      TempSrc:      SpO2: 98% 93% 98%   Weight:    87.6 kg  Height:       Physical Examination: General exam: AAOX3 HEENT:Oral mucosa moist, Ear/Nose WNL grossly Respiratory system: CTA Bilaterally Cardiovascular system: S1 & S2 +, No JVD. Gastrointestinal system: Abdomen soft,NT,ND, BS+ Nervous System: AAOX3, moving left side fairly ok 4+ on LLE-right-sided shoulder pain knee pain limiting mobility and has weakness grossly on the right upper extremity lower and right lower extremity   Extremities: extremities warm, leg edema neg Skin: Warm, no rashes MSK: Normal muscle bulk,tone, power  RUE w/ AVF  Medications reviewed:  Scheduled Meds:  (feeding supplement) PROSource Plus  30 mL Oral BID BM   aspirin  EC  81 mg Oral Daily   atorvastatin   10 mg Oral Daily   cinacalcet   120 mg Oral Q breakfast   ciprofloxacin -dexamethasone   4 drop Left EAR BID   clopidogrel   75 mg Oral Daily   gabapentin   300 mg Oral QHS   heparin   5,000 Units Subcutaneous Q8H   lidocaine   1 patch Transdermal Q24H   midodrine   10 mg Oral TID WC   multivitamin  1 tablet Oral QHS   pantoprazole   40 mg Oral BID AC   psyllium  1 packet Oral Daily   sevelamer  carbonate  3,200 mg Oral TID WC   sodium chloride  flush  3 mL Intravenous Q12H   Continuous Infusions:  piperacillin -tazobactam (ZOSYN )  IV Stopped (02/12/24 9357)   Diet: Diet Order  Diet renal with fluid restriction Fluid restriction: 1200 mL Fluid; Room service appropriate? Yes; Fluid consistency: Thin  Diet effective now

## 2024-02-10 ENCOUNTER — Encounter (INDEPENDENT_AMBULATORY_CARE_PROVIDER_SITE_OTHER): Admitting: Podiatry

## 2024-02-10 ENCOUNTER — Observation Stay (HOSPITAL_COMMUNITY)

## 2024-02-10 DIAGNOSIS — R531 Weakness: Secondary | ICD-10-CM | POA: Diagnosis not present

## 2024-02-10 DIAGNOSIS — A419 Sepsis, unspecified organism: Secondary | ICD-10-CM | POA: Diagnosis not present

## 2024-02-10 DIAGNOSIS — Z91199 Patient's noncompliance with other medical treatment and regimen due to unspecified reason: Secondary | ICD-10-CM

## 2024-02-10 LAB — RESPIRATORY PANEL BY PCR

## 2024-02-10 LAB — CBC
HCT: 29.7 % — ABNORMAL LOW (ref 39.0–52.0)
Hemoglobin: 10 g/dL — ABNORMAL LOW (ref 13.0–17.0)
MCH: 29.8 pg (ref 26.0–34.0)
MCHC: 33.7 g/dL (ref 30.0–36.0)
MCV: 88.4 fL (ref 80.0–100.0)
Platelets: 229 K/uL (ref 150–400)
RBC: 3.36 MIL/uL — ABNORMAL LOW (ref 4.22–5.81)
RDW: 15.8 % — ABNORMAL HIGH (ref 11.5–15.5)
WBC: 9.3 K/uL (ref 4.0–10.5)
nRBC: 0 % (ref 0.0–0.2)

## 2024-02-10 LAB — RENAL FUNCTION PANEL
Albumin: 3.1 g/dL — ABNORMAL LOW (ref 3.5–5.0)
Anion gap: 19 — ABNORMAL HIGH (ref 5–15)
BUN: 74 mg/dL — ABNORMAL HIGH (ref 8–23)
CO2: 25 mmol/L (ref 22–32)
Calcium: 9.9 mg/dL (ref 8.9–10.3)
Chloride: 86 mmol/L — ABNORMAL LOW (ref 98–111)
Creatinine, Ser: 11 mg/dL — ABNORMAL HIGH (ref 0.61–1.24)
GFR, Estimated: 5 mL/min — ABNORMAL LOW
Glucose, Bld: 90 mg/dL (ref 70–99)
Phosphorus: 5.9 mg/dL — ABNORMAL HIGH (ref 2.5–4.6)
Potassium: 5.1 mmol/L (ref 3.5–5.1)
Sodium: 130 mmol/L — ABNORMAL LOW (ref 135–145)

## 2024-02-10 LAB — HEPATITIS B SURFACE ANTIGEN: Hepatitis B Surface Ag: NONREACTIVE

## 2024-02-10 MED ORDER — HEPARIN SODIUM (PORCINE) 1000 UNIT/ML DIALYSIS: 3000.0000 [IU] | INTRAMUSCULAR | Status: DC | PRN

## 2024-02-10 MED ORDER — DIPHENHYDRAMINE HCL 25 MG PO CAPS: 25.0000 mg | ORAL_CAPSULE | Freq: Four times a day (QID) | ORAL | Status: DC | PRN

## 2024-02-10 MED ORDER — MIDODRINE HCL 5 MG PO TABS
5.0000 mg | ORAL_TABLET | ORAL | Status: AC
  Administered 2024-02-10: 5 mg via ORAL
  Filled 2024-02-10: qty 1

## 2024-02-10 MED ORDER — HEPARIN SODIUM (PORCINE) 1000 UNIT/ML IJ SOLN
INTRAMUSCULAR | Status: AC
  Filled 2024-02-10: qty 3

## 2024-02-10 MED ORDER — MIDODRINE HCL 5 MG PO TABS
5.0000 mg | ORAL_TABLET | Freq: Three times a day (TID) | ORAL | Status: DC
  Administered 2024-02-10: 5 mg via ORAL
  Filled 2024-02-10: qty 1

## 2024-02-10 MED ORDER — MIDODRINE HCL 5 MG PO TABS
10.0000 mg | ORAL_TABLET | Freq: Three times a day (TID) | ORAL | Status: DC
  Administered 2024-02-11 – 2024-03-03 (×56): 10 mg via ORAL
  Filled 2024-02-10 (×58): qty 2

## 2024-02-10 MED ORDER — MIDODRINE HCL 5 MG PO TABS: 10.0000 mg | ORAL_TABLET | ORAL | Status: DC

## 2024-02-10 MED ORDER — MIDODRINE HCL 5 MG PO TABS
10.0000 mg | ORAL_TABLET | ORAL | Status: DC | PRN
  Administered 2024-02-11 – 2024-02-29 (×6): 10 mg via ORAL
  Filled 2024-02-10 (×2): qty 2

## 2024-02-10 MED ORDER — CINACALCET HCL 30 MG PO TABS: 30.0000 mg | ORAL_TABLET | Freq: Every day | ORAL | Status: DC

## 2024-02-10 MED ORDER — PENTAFLUOROPROP-TETRAFLUOROETH EX AERO: 1.0000 | INHALATION_SPRAY | CUTANEOUS | Status: DC | PRN

## 2024-02-10 MED ORDER — CINACALCET HCL 30 MG PO TABS
120.0000 mg | ORAL_TABLET | Freq: Every day | ORAL | Status: DC
  Administered 2024-02-11 – 2024-03-03 (×19): 120 mg via ORAL
  Filled 2024-02-10 (×21): qty 4

## 2024-02-10 MED ORDER — CYCLOBENZAPRINE HCL 10 MG PO TABS
10.0000 mg | ORAL_TABLET | Freq: Three times a day (TID) | ORAL | Status: DC | PRN
  Administered 2024-02-10 – 2024-03-02 (×10): 10 mg via ORAL
  Filled 2024-02-10 (×10): qty 1

## 2024-02-10 MED ORDER — CALCITRIOL 0.5 MCG PO CAPS
ORAL_CAPSULE | ORAL | Status: AC
  Filled 2024-02-10: qty 3

## 2024-02-10 NOTE — Progress Notes (Addendum)
 Pt receives out-pt HD at [facility], MWF, 0530 chair time. Will continue to assist as needed.    Lavanda Adelaida Reindel  Dialysis Navigator [phone]

## 2024-02-10 NOTE — Plan of Care (Signed)

## 2024-02-10 NOTE — Progress Notes (Signed)
 Inpatient Rehab Admissions Coordinator:  ? ?Per therapy recommendations,  patient was screened for CIR candidacy by Leita Kleine, MS, CCC-SLP. At this time, Pt. Appears to be a a potential candidate for CIR. I will place   order for rehab consult per protocol for full assessment. Please contact me any with questions. ? ?Leita Kleine, MS, CCC-SLP ?Rehab Admissions Coordinator  ?[phone] (celll) ?[phone] (office) ? ?

## 2024-02-10 NOTE — Progress Notes (Signed)
 " PROGRESS NOTE Carl Klugh Sr.  FMW:[id number] DOB: 09/08/1960 DOA: 02/08/2024 PCP: [name], MD  Brief Narrative/Hospital Course: Carl Ambers Sr. is a 63 y.o. male with PMH of anterior cervical corpectomy C4 and C3-C5 fusion (Dr. [name], 05/25/2023), PAD s/p angioplasty/stenting right SFA and popliteal arteries 12/09/2023 for critical limb ischemia, ESRD, and more presenting with progressive right-sided weakness and pain x 1 wk. patient reports right leg acting funny about 1 wk w/ pain and weakness, worsening over the past several days.He also reports progressive right arm and shoulder weakness, now unable to lift the arm overhead.The left arm and leg are unaffected. Patient had multiple falls at home. also complains of left ear pain and drainage despite recent cerumen disimpaction earlier in the week and subsequent use of ear drops. Seen at [facility] 12/26 X-rays reportedly without acute findings  and was started on oral antibiotics In ED: Vitals with mild tachycardia fever, labs with mild leukocytosis potassium 5.0 influenza COVID-negative developed a fever felt most consistent with left otitis media.Primary concern was worsening neurologic deficits in the setting of prior cervical spine surgery. CT head and hip X-rays were unremarkable MRI brain>> no acute findings  MRI cervical spine >>demonstrated no interval change from prior surgery and no clear etiology for current weakness. Imaging did note prior left-sided strokes, without evidence of acute stroke or recrudescence. Neurosurgery consulted and Dr. Colon recommends no acute intervention, with plan to continue following as inpatient. Neurology and ENT was also consulted-underwent CT temporal bone with and without contrast along with MRI shoulder T-spine L-spine.   Subjective: Seen and examined in dialysis this morning Alert awake oriented no new complaints still with painful movement on the right knee right shoulder right elbow limiting  his movement Able to move lower extremities on the left but unable to have good strength antigravity Overall unchanged Overnight no fever recurrence, BP stable on room air Labs-potassium prior BUN 74 creatinine 11 CBC with normalized WBC  Assessment and plan:  Sepsis POA Left suppurative otitis media with suspected TM perforation: Sepsis POA likely source as otitis media, no respiratory symptoms COVID influenza negative.  RVP panel pending, blood culture [facility]D ENT consulted-[name] -subsequently underwent CT temporal bone with and without no evidence of coalescent mastoiditis, left moderate mastoid effusion right mild mastoid effusion no tegmen dehiscence no abscess on imaging, has right EAC cerumen impaction.  Currently on Zosyn , advised to do Augmentin  x 10 days on discharge, continue Ciprodex  drops twice daily  ESRD on HD MWF Mild hyperkalemia Metabolic bone disease: Nephro following for HD today.Continue home Renvela  and calcitriol  sensipar .  Monitor electrolytes  Worsening weakness on the right side with multiple falls at home S/P cervical decompression with a corpectomy at C4 by Dr. [name] in April Remote left occipital infarct: Postsurgery was able to walk, Now with right-sided weakness for several days  MRI brain no acute stroke,MRI C-spine no significant finding no evidence of cord new cord compression. Per neurosurgery Dr Elsner/Dr Onetha no indication for acute intervention advised further workup per neurology for unexplained right-sided weakness.  Seen by neurology underwent MRI T, L-spine and MRI of the right shoulder.  Also ENT consulted **RLE with pain at the hip and knee increased tone resist passive range of motion, left 4 x 5 unable to maintain antigravity strength sensation intact bilaterally. MRI T-spine-  Linear edema in the left eccentric T11 and T12 superior endplates could represent degenerative change or acute/subacute fractures without significant height loss, with  severe left foraminal stenosis  T11-12, moderate right foraminal stenosis at multiple places see MRI finding MRI L-spine: L3-L4, mod-severe Rt and mild left foraminal stenosis,Mod rt subarticular recess stenosis and mild canal stenosis. At L4-L5, moderate right foraminal stenosis.Right lateral L3-L4 disc edema and mild surrounding marrow edema which is most likely degenerative.  So far workup unrevealing Appreciate neurology and neurosurgery input-suspect antalgic worsening of underlying deficit.  Does have some cervical narrowing unclear if it is significant enough to cause symptoms, neurosurgery following. If he has worsening deficits in a few weeks EMG/NCS could be considered He has painful shoulder joint elbow area right knee limiting his mobility as well- obtain x-ray of the right knee Continue PT OT recommending CIR. Continue pain management Discussed with neurology team no further recommendation, patient having some point tenderness around T11-12 area will let Dr. Colon know.  Status post right reverse shoulder arthroplasty: Nondisplaced fracture, fluid collection Likely postop in the posterior subacromial/subdeltoid space.  Severe fatty atrophy of the subscapularis supraspinatus and infraspinatus muscle, cont ptot,pain control.  PAD S/p angiogram with shockwave lithotripsy, drug-coated balloon angioplasty, and stenting of the right SFA and popliteal:  Continue home aspirin  Plavix  statin.  GERD: Continue PPI  Essential hypertension Hypotension: BP is soft he takes midodrine  predialysis resumed  T2DM w/ diabetic peripheral angiopathy without gangrene and HLD: Blood sugar controlled.  Last A1c normal in 2022.  Mobility: PT Orders: Active PT Follow up Rec: Acute Inpatient Rehab (3hours/Day)02/09/2024 0800   DVT prophylaxis: heparin  injection 5,000 Units Start: 02/09/24 0600 Code Status:   Code Status: Full Code Family Communication: plan of care discussed with patient at  bedside. Patient status is: Remains hospitalized because of severity of illness Level of care: Telemetry   Dispo: The patient is from: home            Anticipated disposition: TBD Objective: Vitals last 24 hrs: Vitals:   02/10/24 0930 02/10/24 1000 02/10/24 1030 02/10/24 1100  BP: (!) 102/58 102/60 103/69 (!) 107/57  Pulse: 81 81 86 86  Resp: 15 14 15 16   Temp:      TempSrc:      SpO2: 96% 95% 95% 99%  Weight:      Height:       Physical Examination: General exam: AAOX3 HEENT:Oral mucosa moist, Ear/Nose WNL grossly Respiratory system: CTA Bilaterally Cardiovascular system: S1 & S2 +, No JVD. Gastrointestinal system: Abdomen soft,NT,ND, BS+ Nervous System: Alert, awake, moving left side, painful mobility in the right side shoulder elbow and knee Extremities: extremities warm, leg edema neg Skin: Warm, no rashes MSK: Normal muscle bulk,tone, power  RUE avf+ w/ thrill  Medications reviewed:  Scheduled Meds:  aspirin  EC  81 mg Oral Daily   atorvastatin   10 mg Oral Daily   calcitRIOL   1.5 mcg Oral Q M,W,F-HD   [START ON 02/11/2024] cinacalcet   120 mg Oral Q breakfast   ciprofloxacin -dexamethasone   4 drop Left EAR BID   clopidogrel   75 mg Oral Daily   gabapentin   300 mg Oral QHS   heparin   5,000 Units Subcutaneous Q8H   lidocaine   1 patch Transdermal Q24H   midodrine   10 mg Oral Q M,W,F-HD   pantoprazole   40 mg Oral BID AC   psyllium  1 packet Oral Daily   sevelamer  carbonate  3,200-4,000 mg Oral TID WC   sodium chloride  flush  3 mL Intravenous Q12H   Continuous Infusions:  piperacillin -tazobactam (ZOSYN )  IV 2.25 g (02/10/24 0536)   Diet: Diet Order  Diet renal with fluid restriction Fluid restriction: 1200 mL Fluid; Room service appropriate? Yes; Fluid consistency: Thin  Diet effective now                  Data Reviewed: I have personally reviewed following labs and imaging studies ( see epic result tab) CBC: Recent Labs  Lab 02/08/24 1201  02/09/24 0520 02/10/24 0629  WBC 11.7* 12.1* 9.3  NEUTROABS 9.4*  --   --   HGB 11.4* 10.1* 10.0*  HCT 35.3* 30.8* 29.7*  MCV 91.7 89.5 88.4  PLT 223 216 229   CMP: Recent Labs  Lab 02/08/24 1201 02/09/24 0520 02/10/24 0629  NA 135 134* 130*  K 5.0 5.2* 5.1  CL 89* 89* 86*  CO2 28 28 25   GLUCOSE 101* 141* 90  BUN 48* 61* 74*  CREATININE 7.80* 9.29* 11.00*  CALCIUM  10.8* 9.8 9.9  PHOS  --   --  5.9*   GFR: Estimated Creatinine Clearance: 7.6 mL/min (A) (by C-G formula based on SCr of 11 mg/dL (H)). Recent Labs  Lab 02/10/24 0629  ALBUMIN  3.1*   No results for input(s): LIPASE, AMYLASE in the last 168 hours. No results for input(s): AMMONIA in the last 168 hours. Coagulation Profile: No results for input(s): INR, PROTIME in the last 168 hours. Unresulted Labs (From admission, onward)     Start     Ordered   02/10/24 0500  Hepatitis B surface antibody,quantitative  (New Admission Hemo Labs (Hepatitis B))  Tomorrow morning,   R        02/09/24 1218   02/09/24 1413  Respiratory (~20 pathogens) panel by PCR  (Respiratory panel by PCR (~20 pathogens, ~24 hr TAT)  w precautions)  Once,   R        02/09/24 1412           Antimicrobials/Microbiology: Anti-infectives (From admission, onward)    Start     Dose/Rate Route Frequency Ordered Stop   02/09/24 0600  piperacillin -tazobactam (ZOSYN ) IVPB 2.25 g        2.25 g 100 mL/hr over 30 Minutes Intravenous Every 8 hours 02/09/24 0135     02/08/24 2200  amoxicillin -clavulanate (AUGMENTIN ) 875-125 MG per tablet 1 tablet  Status:  Discontinued        1 tablet Oral Every 12 hours 02/08/24 2104 02/09/24 0135   02/08/24 2200  piperacillin -tazobactam (ZOSYN ) IVPB 3.375 g        3.375 g 100 mL/hr over 30 Minutes Intravenous  Once 02/08/24 2153 02/08/24 2314   02/08/24 0000  amoxicillin -clavulanate (AUGMENTIN ) 875-125 MG tablet        1 tablet Oral Every 12 hours 02/08/24 1908 02/18/24 2359         Component Value  Date/Time   SDES BLOOD BLOOD LEFT WRIST 02/08/2024 2239   SPECREQUEST  02/08/2024 2239    BOTTLES DRAWN AEROBIC AND ANAEROBIC Blood Culture adequate volume   CULT  02/08/2024 2239    NO GROWTH 2 DAYS Performed at [facility], [address] [place], [name]    REPTSTATUS PENDING 02/08/2024 2239    Procedures:    Mennie LAMY, MD [facility] 02/10/2024, 1:17 PM   "

## 2024-02-10 NOTE — Progress Notes (Addendum)
 " [facility] Progress Note   Subjective:   Seen at start of HD - 2L UFG planned. No CP/dyspnea. Ongoing R sided weakness. Afebrile today.  Objective Vitals:   02/09/24 2045 02/09/24 2322 02/10/24 0408 02/10/24 0730  BP: 101/66 94/67 106/70 100/61  Pulse: 81 84 85 92  Resp: 20 20 20 20   Temp: 98.6 F (37 C) 98.6 F (37 C) 98.1 F (36.7 C) 98.6 F (37 C)  TempSrc: Oral  Oral Oral  SpO2: 99% 94% 96% 96%  Weight:      Height:       Physical Exam General: Well appearing man, NAD. Room air Heart: RRR Lungs: CTAB Abdomen: soft Extremities: no LE edema Neuro: Weakness R arm and R leg Dialysis Access: AVF  Additional Objective Labs: Basic Metabolic Panel: Recent Labs  Lab 02/08/24 1201 02/09/24 0520 02/10/24 0629  NA 135 134* 130*  K 5.0 5.2* 5.1  CL 89* 89* 86*  CO2 28 28 25   GLUCOSE 101* 141* 90  BUN 48* 61* 74*  CREATININE 7.80* 9.29* 11.00*  CALCIUM  10.8* 9.8 9.9  PHOS  --   --  5.9*   Liver Function Tests: Recent Labs  Lab 02/10/24 0629  ALBUMIN  3.1*   CBC: Recent Labs  Lab 02/08/24 1201 02/09/24 0520 02/10/24 0629  WBC 11.7* 12.1* 9.3  NEUTROABS 9.4*  --   --   HGB 11.4* 10.1* 10.0*  HCT 35.3* 30.8* 29.7*  MCV 91.7 89.5 88.4  PLT 223 216 229   Blood Culture    Component Value Date/Time   SDES BLOOD BLOOD LEFT WRIST 02/08/2024 2239   SPECREQUEST  02/08/2024 2239    BOTTLES DRAWN AEROBIC AND ANAEROBIC Blood Culture adequate volume   CULT  02/08/2024 2239    NO GROWTH < 12 HOURS Performed at [facility], [address], [place], [name]    REPTSTATUS PENDING 02/08/2024 2239   Studies/Results: MR THORACIC SPINE WO CONTRAST Result Date: 02/09/2024 EXAM: MRI Thoracic Spine Without Intravenous Contrast 02/09/2024 06:28:29 PM TECHNIQUE: Multiplanar multisequence MRI of the thoracic spine was performed without the administration of intravenous contrast. COMPARISON: None available. CLINICAL HISTORY: Mid-back pain  FINDINGS: BONES AND ALIGNMENT: Dectrocurvature. Linear edema in the left eccentric T11 and T12 superior endplates could represent degenerative change or acute/subacute fractures without significant height loss. Diffusely low signal within the bone marrow, likely related to known chronic kidney disease. No specific evidence of discitis/osteomyelitis. Degenerative/discogenic changes can play a role and signal changes at multiple levels. SPINAL CORD: Normal spinal cord volume. Normal spinal cord signal. SOFT TISSUES: Unremarkable. DEGENERATIVE CHANGES: Multilevel facet arthropathy and dextrocurvature contributes to foraminal stenosis. This includes moderate right foraminal stenosis at T2 T3 , T8-T9, and T9-T10. Severe left foraminal stenosis at T11-T12. Significant canal stenosis . IMPRESSION: 1. Linear edema in the left eccentric T11 and T12 superior endplates could represent degenerative change or acute/subacute fractures without significant height loss. Correlate with the presence or absence of point tenderness. 2. Severe left foraminal stenosis at T11-T12. 3. Moderate right foraminal stenosis at T2 T3 , T8-T9, and T9-T 4. S-shaped thoracolumbar curvature. 5. No significant canal stenosis. Electronically signed by: Gilmore Molt 02/09/2024 08:20 PM EST RP Workstation: [id number]   MR LUMBAR SPINE WO CONTRAST Result Date: 02/09/2024 EXAM: MRI LUMBAR SPINE 02/09/2024 06:28:15 PM TECHNIQUE: Multiplanar multisequence MRI of the lumbar spine was performed without the administration of intravenous contrast. COMPARISON: None available. CLINICAL HISTORY: Low back pain, prior surgery, new symptoms FINDINGS: BONES  AND ALIGNMENT: Grade 1 anterolisthesis of L3 on L4. Rotatory levocurvature. Heterogeneous bone marrow which is low in attenuation on T1, likely related to patient's known chronic renal disease. Mild right lateral L3-L4 disc edema and surrounding marrow edema. Normal vertebral body heights. SPINAL CORD: The  conus terminates normally. SOFT TISSUES: No paraspinal mass. L1-L2: No significant disc herniation. No spinal canal stenosis or neural foraminal narrowing. L2-L3: Mild disc bulge, right greater than left facet arthropathy. No significant canal or foraminal stenosis. L3-L4: Grade 1 anterolisthesis. Right lateral disc edema and degenerative disc disease with mild associated endplate signal changes. Disc bulging with bilateral facet arthropathy. Severe right foraminal stenosis. Moderate right subarticular recess stenosis. Mild canal stenosis. L4-L5: Disc bulge, right greater than left facet arthropathy. Resulting moderate right foraminal stenosis. Patent canal and left foramen. L5-S1: Disc bulge and endplate spurring. Facet arthropathy resulting in mild left foraminal stenosis. IMPRESSION: 1. At L3-L4, moderate to severe right and mild left foraminal stenosis. Moderate right subarticular recess stenosis and mild canal stenosis. 2. At L4-L5, moderate right foraminal stenosis. 3. Right lateral L3-L4 disc edema and mild surrounding marrow edema which is most likely degenerative in the setting of rotatory levocurvature and severe degenerative changes at this level. Recommend correlation with infectious markers to exclude early discitis, which is thought less likely. Electronically signed by: Gilmore Molt 02/09/2024 08:01 PM EST RP Workstation: [id number]   MR SHOULDER RIGHT WO CONTRAST Result Date: 02/09/2024 CLINICAL DATA:  Pain after fall. 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 EXAM: MRI OF THE RIGHT SHOULDER WITHOUT CONTRAST TECHNIQUE: Multiplanar, multisequence MR imaging of the shoulder was performed. No intravenous contrast was administered. COMPARISON:  Right shoulder radiographs dated 02/07/2024. FINDINGS: Bones/Joint Status post right reverse shoulder arthroplasty with associated susceptibility artifact which significantly limits evaluation of the surrounding bone and soft tissues. No  discrete fracture identified on this limited examination. Soft tissue and Muscles There is a well-circumscribed T2 intermediate to hyperintense and T1 hypointense fluid collection within the posterior subacromial/subdeltoid space measuring approximately 5.1 x 2.9 x 1.9 cm (series 6, image 5 and series 9, image 15), favored to represent a postoperative collection such as a seroma. Severe fatty atrophy of the subscapularis, supraspinatus, and infraspinatus muscles. IMPRESSION: 1. Status post right reverse shoulder arthroplasty with associated susceptibility artifact which significantly limits evaluation of the surrounding bone and soft tissues. No discrete fracture identified on this limited examination. CT could be considered for further evaluation of the reverse shoulder arthroplasty and if there is persistent concern for acute fracture. 2. A well-circumscribed fluid collection within the posterior subacromial/subdeltoid space measuring approximately 5.1 x 2.9 x 1.9 cm is favored to represent a postoperative collection such as a seroma. 3. Severe fatty atrophy of the subscapularis, supraspinatus, and infraspinatus muscles. Electronically Signed   By: Harrietta Sherry M.D.   On: 02/09/2024 19:07   CT TEMPORAL BONES W WO CONTRAST Result Date: 02/09/2024 EXAM: CT TEMPORAL BONES WITH AND WITHOUT CONTRAST 02/09/2024 05:08:46 PM TECHNIQUE: CT of the temporal bones was performed with and without the administration of intravenous contrast. 75 mL of iohexol  (OMNIPAQUE ) 350 MG/ML injection was administered. Multiplanar reformatted images are provided for review. Automated exposure control, iterative reconstruction, and/or weight based adjustment of the mA/kV was utilized to reduce the radiation dose to as low as reasonably achievable. COMPARISON: CT head and MRI brain 02/08/2024. CLINICAL HISTORY: Sepsis with suspected source of suppurative left acute otitis media. FINDINGS: RIGHT TEMPORAL BONE: EXTERNAL AUDITORY CANAL:  The right external auditory  canal appears mildly narrowed at the junction of the cartilaginous and osseous portions. There is no evidence of mass. Scutum is intact. MIDDLE EAR CAVITY: The right middle ear cavity is clear. The ossicles are normal in position and morphology without erosive change. Normal appearance of the right tympanic membrane. MASTOID AIR CELLS: Moderate right mastoid effusion with significant opacification of mastoid air cells most pronounced posteriorly and inferiorly. There are residual aerated mastoid air cells superiorly. There is partial opacification of the mastoid antrum. Osseous septations of the mastoid air cells are intact. There is no evidence of coalescent mastoiditis. INNER EAR: The cochlea and vestibule are unremarkable. Normal mineralization of the otic capsule. Normal semicircular canals. The vestibular aqueduct is not dilated. INTERNAL AUDITORY CANAL: Unremarkable. Normal bony canal of the facial nerve. LEFT TEMPORAL BONE: EXTERNAL AUDITORY CANAL: There is additional mild soft tissue thickening along the osseous left external auditory canal. The left scutum is intact. No bony erosion. MIDDLE EAR CAVITY: There is complete opacification of the left middle ear with soft tissue fluid surrounding the ossicles. An underlying small mass cannot be excluded; however, there are no erosive changes of the ossicles appreciated. There is likely thickening of the left tympanic membrane, which is partially obscured by opacification of the left middle ear. There is no evidence of dehiscence of the tegmen tympani or tegmen mastoidium. MASTOID AIR CELLS: Large left mastoid effusion with complete opacification of the mastoid air cells. There is opacification of the mastoid antrum. The osseous septations of the mastoid air cells are intact. There is no evidence of coalescent mastoiditis. INNER EAR: The cochlea and vestibule are unremarkable. Normal mineralization of the otic capsule. Normal  semicircular canals. The vestibular aqueduct is not dilated. INTERNAL AUDITORY CANAL: Unremarkable. Normal bony canal of the facial nerve. VASCULAR: Normal jugular bulbs. Normal carotid canals. There is no abnormal enhancement or abnormal soft tissue adjacent to the mastoid temporal bones. There is no evidence of Bezold abscess. BRAIN: Dense calcifications along the visualized falx and tentorium. Unremarkable. ORBITS: No acute abnormality. SINUSES: Mild mucosal thickening in the inferior left maxillary sinus. Additional mild scattered mucosal thickening in the ethmoid sinuses. OTHER FINDINGS: Punctate calcifications in the partially visualized bilateral parotid glands. Degenerative changes of the bilateral temporomandibular joints. IMPRESSION: 1. Findings compatible with left otomastoiditis. No evidence of coalescent mastoiditis, tegmen dehiscence, or adjacent soft tissue abscess. 2. Complete opacification of the left middle ear with surrounding soft tissue/fluid about the ossicles. Small underlying mass cannot be excluded. There is no ossicular erosion. 3. Moderate right mastoid effusion without coalescent mastoiditis. Electronically signed by: Donnice Mania MD 02/09/2024 06:32 PM EST RP Workstation: [id number]   MR Cervical Spine Wo Contrast Result Date: 02/08/2024 EXAM: MRI CERVICAL SPINE WITHOUT CONTRAST 02/08/2024 06:05:08 PM TECHNIQUE: Multiplanar multisequence MRI of the cervical spine was performed. COMPARISON: None available. CLINICAL HISTORY: Ataxia, nontraumatic, cervical pathology suspected FINDINGS: BONES AND ALIGNMENT: C3-C5 ACDF with C4 corpectomy. Normal alignment. Edema surrounding the C5-C6 disc is likely degenerative/discogenic. RIght C4-C5 perifact edema, also likely degenerative. Otherwise, Bone marrow signal is unremarkable. SPINAL CORD: Normal spinal cord size. No abnormal spinal cord signal. SOFT TISSUES: No paraspinal mass. C2-C3: Ligamentum flavum thickening and mild facet arthropathy.  Resulting mild to moderate canal stenosis. C3-C4: ACDF. Ligamentum flavum thickening and bilateral facet arthropathy. Mild to moderate canal stenosis. Moderate bilateral foraminal stenosis. C4-C5: ACDF. C4 corpectomy. Facter athropathy greater on the right where there also is perifacet edema. Motion limited assessment with widely patent canal and likely patent foramina. C5-C6:  Small posterior disc osteophyte complex . Bilateral facet and uncovertebral hypertrophy. Motion-limited assessment with suspected moderate left foraminal stenosis. Patent canal and right foramen. C6-C7: Right .in the left facet and uncovertebral hypertrophy. Right foraminal stenosis. Patent canal and left foramen. C7-T1: No significant disc herniation. No spinal canal stenosis or neural foraminal narrowing. IMPRESSION: 1. C3-C5 ACDF with C4 corpectomy.  Limited evaluation of hardware by MRI. 2. Motion limited study with suspected moderate foraminal stenosis bilaterally at C3-C4 and on the left at C5-C6. Mild to moderate canal stenosis at C2-C3 and C3-C4. 3. RIght C4-C5 perifacet and C5-C6 discogenic endplate edema, likely degenerative in etiology. Electronically signed by: Gilmore Molt 02/08/2024 08:16 PM EST RP Workstation: [id number]   MR BRAIN WO CONTRAST Result Date: 02/08/2024 EXAM: MRI BRAIN WITHOUT CONTRAST 02/08/2024 05:44:06 PM TECHNIQUE: Multiplanar multisequence MRI of the head/brain was performed without the administration of intravenous contrast. COMPARISON: Same day CT head and comparison MRI brain 05/25/2023. CLINICAL HISTORY: Ataxia, head trauma. FINDINGS: BRAIN AND VENTRICLES: Similar appearance of extensive dural calcifications. T2 and FLAIR hyperintensity in the periventricular and subcortical white matter compatible with mild chronic microvascular ischemic changes. There is a small remote infarct in the left frontal periventricular white matter which is similar to prior. Lacunar infarcts in the left basal ganglia.  Remote infarct in the left occipital lobe. No acute infarct. No intracranial hemorrhage. No mass. No midline shift. No hydrocephalus. The sella is unremarkable. Normal flow voids. ORBITS: Bilateral lens replacements. 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 Large bilateral mastoid effusions. Mucosal thickening in the right maxillary sinus. BONES AND SOFT TISSUES: Subtle right frontal scalp soft tissue swelling better evaluated on the same day CT. Partially visualized cervical fusion hardware. Normal marrow signal. IMPRESSION: 1. No acute intracranial abnormality related to the head trauma. 2. Large bilateral mastoid effusions. 3. Small remote infarct in the left frontal periventricular white matter and remote lacunar infarcts in the left basal ganglia. 4. Remote infarct in the left occipital lobe. Electronically signed by: Donnice Mania MD 02/08/2024 06:59 PM EST RP Workstation: [id number]   CT Head Wo Contrast Result Date: 02/08/2024 CLINICAL DATA:  Status post trauma. EXAM: CT HEAD WITHOUT CONTRAST TECHNIQUE: Contiguous axial images were obtained from the base of the skull through the vertex without intravenous contrast. RADIATION DOSE REDUCTION: This exam was performed according to the departmental dose-optimization program which includes automated exposure control, adjustment of the mA and/or kV according to patient size and/or use of iterative reconstruction technique. COMPARISON:  June 09, 2023 FINDINGS: Brain: No evidence of acute infarction, hemorrhage, hydrocephalus, extra-axial collection or mass lesion/mass effect. Stable marked severity pachymeningeal calcification is noted. There is a small chronic left occipital lobe infarct. A chronic left basal gangliar infarct is also seen. Vascular: Marked severity bilateral cavernous carotid artery calcification is noted. Skull: Normal. Negative for fracture or focal lesion. Sinuses/Orbits: No acute finding. Other: There  is mild right frontal scalp soft tissue swelling. IMPRESSION: 1. No acute intracranial abnormality. 2. Small chronic left occipital lobe infarct. 3. Chronic left basal gangliar infarct. 4. Mild right frontal scalp soft tissue swelling. Electronically Signed   By: Suzen Dials M.D.   On: 02/08/2024 17:57   DG Hip Unilat W or Wo Pelvis 2-3 Views Right Result Date: 02/08/2024 EXAM: 2 OR MORE VIEW(S) XRAY OF THE BILATERAL HIP 02/08/2024 12:45:00 PM COMPARISON: None available. CLINICAL HISTORY: fall, R sided pain/soreness FINDINGS: BONES AND JOINTS: Left Hip arthroplasty in place. Large subcortical cysts at the Right  Femoral Neck and Head. Mild degenerative changes of the Right Hip. No acute fracture. No malalignment. SOFT TISSUES: Right lower quadrant surgical clips noted. Extensive vascular calcifications. IMPRESSION: 1. No acute fracture or dislocation. 2. Large subcortical cysts at the right femoral neck and head. 3. Left hip arthroplasty in place. Electronically signed by: Morgane Naveau MD 02/08/2024 01:48 PM EST RP Workstation: [id number]   Medications:  piperacillin -tazobactam (ZOSYN )  IV 2.25 g (02/10/24 0536)    aspirin  EC  81 mg Oral Daily   atorvastatin   10 mg Oral Daily   calcitRIOL   1.5 mcg Oral Q M,W,F-HD   [START ON 02/11/2024] cinacalcet   30 mg Oral Q breakfast   ciprofloxacin -dexamethasone   4 drop Left EAR BID   clopidogrel   75 mg Oral Daily   gabapentin   300 mg Oral QHS   heparin   5,000 Units Subcutaneous Q8H   lidocaine   1 patch Transdermal Q24H   midodrine   10 mg Oral Q M,W,F-HD   pantoprazole   40 mg Oral BID AC   psyllium  1 packet Oral Daily   sevelamer  carbonate  3,200-4,000 mg Oral TID WC   sodium chloride  flush  3 mL Intravenous Q12H    Dialysis Orders MWF - NW 4hr, 400/A1.5, EDW 87.3kg, 2K/2Ca bath, AVF, heparin  3000 unit bolus ESA: Mircera 50 q 2 wks (last 12/15) BMM: Calcitriol  2 mcg PO q HD, cinacalcet  120 mg daily    Assessment/Plan: R sided weakness. Hx  cervical spine injury/fusion. No acute CVA on MRI, significant DDD. Neurosurgery following. Fevers/ear pain. Blood Cx negative so far. + mastoid effusions on MRI. Antibiotics per primary team.  ESRD: Continue HD on usual MWF schedule - HD now, 2L UFG. Hyperkalemia: Does take Lokelma  on non-HD days, given yesterday Hypertension/volume. BP soft. On midodrine  10mg  with HD.  Anemia of ESRD. Hgb stable - due for ESA later this week Metabolic bone disease. CorrCa high, continue sevelamer  binder, adjust dose sensipar .   [name], PA-C 02/10/2024, 8:41 AM  [facility]   Seen and examined independently.  Agree with note and exam as documented above by physician extender and as noted here.  Stenosis noted at multiple levels on MRI. With right arm weakness - can lift slightly against gravity today   Seen and examined on dialysis.  Blood pressure 96/57 and HR 82.  Procedure supervised.  States his BP is usually low and that he runs in the 90's systolic.  Lowered UF goal to 1.5 kg and see that additional midodrine  was ordered.  Agree. Spoke with HD RN. Refuses blood products so deferred albumin .  Right AVF in use.    [name], MD 02/10/2024  9:12 AM  "

## 2024-02-10 NOTE — Progress Notes (Signed)
 Subjective: Patient reports patient overall stable still reports right arm and right leg weakness going on for a week.  Objective: Vital signs in last 24 hours: Temp:  [98.1 F (36.7 C)-99.9 F (37.7 C)] 98.2 F (36.8 C) (12/29 1331) Pulse Rate:  [81-98] 87 (12/29 1331) Resp:  [12-20] 15 (12/29 1331) BP: (90-118)/(54-82) 118/60 (12/29 1331) SpO2:  [94 %-100 %] 100 % (12/29 1331) Weight:  [91 kg] 91 kg (12/29 0848)  Intake/Output from previous day: 12/28 0701 - 12/29 0700 In: 240 [P.O.:240] Out: -  Intake/Output this shift: Total I/O In: 236 [P.O.:236] Out: 1500 [Other:1500]  Patient had to have significant right upper extremity weakness mostly tricep and deltoid hand intrinsics also about 4+ out of 5 tricep about 3 out of 5 deltoid is 0-1 out of 5 reports having shoulder surgery about 4 months ago but was doing well prior to 1 week ago.  Also reports right lower extremity weakness and significant pain around the knee joint.  Seems to have 2-3 out of 5 weakness proximally right lower extremity 1-2 in his quad but this seems to be mostly secondary to knee pain as 4- out of 5 dorsiflexion of the right foot.  Left lower extremity peers to be stronger 4+.  Lab Results: Recent Labs    02/09/24 0520 02/10/24 0629  WBC 12.1* 9.3  HGB 10.1* 10.0*  HCT 30.8* 29.7*  PLT 216 229   BMET Recent Labs    02/09/24 0520 02/10/24 0629  NA 134* 130*  K 5.2* 5.1  CL 89* 86*  CO2 28 25  GLUCOSE 141* 90  BUN 61* 74*  CREATININE 9.29* 11.00*  CALCIUM  9.8 9.9    Studies/Results: DG Knee 1-2 Views Right Result Date: 02/10/2024 CLINICAL DATA:  Pain. EXAM: RIGHT KNEE - 1-2 VIEW COMPARISON:  06/11/2023 FINDINGS: The bones are subjectively under mineralized. Medial and lateral tibiofemoral joint space narrowing. There is tricompartmental peripheral spurring. Moderate joint effusion. Quadriceps and patellar tendon enthesophytes. No erosive or bony destructive change. Vascular calcifications  with vascular stent in place. IMPRESSION: Tricompartmental osteoarthritis with moderate joint effusion. Electronically Signed   By: Andrea Gasman M.D.   On: 02/10/2024 14:45   MR THORACIC SPINE WO CONTRAST Result Date: 02/09/2024 EXAM: MRI Thoracic Spine Without Intravenous Contrast 02/09/2024 06:28:29 PM TECHNIQUE: Multiplanar multisequence MRI of the thoracic spine was performed without the administration of intravenous contrast. COMPARISON: None available. CLINICAL HISTORY: Mid-back pain FINDINGS: BONES AND ALIGNMENT: Dectrocurvature. Linear edema in the left eccentric T11 and T12 superior endplates could represent degenerative change or acute/subacute fractures without significant height loss. Diffusely low signal within the bone marrow, likely related to known chronic kidney disease. No specific evidence of discitis/osteomyelitis. Degenerative/discogenic changes can play a role and signal changes at multiple levels. SPINAL CORD: Normal spinal cord volume. Normal spinal cord signal. SOFT TISSUES: Unremarkable. DEGENERATIVE CHANGES: Multilevel facet arthropathy and dextrocurvature contributes to foraminal stenosis. This includes moderate right foraminal stenosis at T2 T3 , T8-T9, and T9-T10. Severe left foraminal stenosis at T11-T12. Significant canal stenosis . IMPRESSION: 1. Linear edema in the left eccentric T11 and T12 superior endplates could represent degenerative change or acute/subacute fractures without significant height loss. Correlate with the presence or absence of point tenderness. 2. Severe left foraminal stenosis at T11-T12. 3. Moderate right foraminal stenosis at T2 T3 , T8-T9, and T9-T 4. S-shaped thoracolumbar curvature. 5. No significant canal stenosis. Electronically signed by: Gilmore Molt 02/09/2024 08:20 PM EST RP Workstation: [id number]   MR LUMBAR SPINE  WO CONTRAST Result Date: 02/09/2024 EXAM: MRI LUMBAR SPINE 02/09/2024 06:28:15 PM TECHNIQUE: Multiplanar multisequence MRI  of the lumbar spine was performed without the administration of intravenous contrast. COMPARISON: None available. CLINICAL HISTORY: Low back pain, prior surgery, new symptoms FINDINGS: BONES AND ALIGNMENT: Grade 1 anterolisthesis of L3 on L4. Rotatory levocurvature. Heterogeneous bone marrow which is low in attenuation on T1, likely related to patient's known chronic renal disease. Mild right lateral L3-L4 disc edema and surrounding marrow edema. Normal vertebral body heights. SPINAL CORD: The conus terminates normally. SOFT TISSUES: No paraspinal mass. L1-L2: No significant disc herniation. No spinal canal stenosis or neural foraminal narrowing. L2-L3: Mild disc bulge, right greater than left facet arthropathy. No significant canal or foraminal stenosis. L3-L4: Grade 1 anterolisthesis. Right lateral disc edema and degenerative disc disease with mild associated endplate signal changes. Disc bulging with bilateral facet arthropathy. Severe right foraminal stenosis. Moderate right subarticular recess stenosis. Mild canal stenosis. L4-L5: Disc bulge, right greater than left facet arthropathy. Resulting moderate right foraminal stenosis. Patent canal and left foramen. L5-S1: Disc bulge and endplate spurring. Facet arthropathy resulting in mild left foraminal stenosis. IMPRESSION: 1. At L3-L4, moderate to severe right and mild left foraminal stenosis. Moderate right subarticular recess stenosis and mild canal stenosis. 2. At L4-L5, moderate right foraminal stenosis. 3. Right lateral L3-L4 disc edema and mild surrounding marrow edema which is most likely degenerative in the setting of rotatory levocurvature and severe degenerative changes at this level. Recommend correlation with infectious markers to exclude early discitis, which is thought less likely. Electronically signed by: Gilmore Molt 02/09/2024 08:01 PM EST RP Workstation: [id number]   MR SHOULDER RIGHT WO CONTRAST Result Date: 02/09/2024 CLINICAL DATA:   Pain after fall. 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 EXAM: MRI OF THE RIGHT SHOULDER WITHOUT CONTRAST TECHNIQUE: Multiplanar, multisequence MR imaging of the shoulder was performed. No intravenous contrast was administered. COMPARISON:  Right shoulder radiographs dated 02/07/2024. FINDINGS: Bones/Joint Status post right reverse shoulder arthroplasty with associated susceptibility artifact which significantly limits evaluation of the surrounding bone and soft tissues. No discrete fracture identified on this limited examination. Soft tissue and Muscles There is a well-circumscribed T2 intermediate to hyperintense and T1 hypointense fluid collection within the posterior subacromial/subdeltoid space measuring approximately 5.1 x 2.9 x 1.9 cm (series 6, image 5 and series 9, image 15), favored to represent a postoperative collection such as a seroma. Severe fatty atrophy of the subscapularis, supraspinatus, and infraspinatus muscles. IMPRESSION: 1. Status post right reverse shoulder arthroplasty with associated susceptibility artifact which significantly limits evaluation of the surrounding bone and soft tissues. No discrete fracture identified on this limited examination. CT could be considered for further evaluation of the reverse shoulder arthroplasty and if there is persistent concern for acute fracture. 2. A well-circumscribed fluid collection within the posterior subacromial/subdeltoid space measuring approximately 5.1 x 2.9 x 1.9 cm is favored to represent a postoperative collection such as a seroma. 3. Severe fatty atrophy of the subscapularis, supraspinatus, and infraspinatus muscles. Electronically Signed   By: Harrietta Sherry M.D.   On: 02/09/2024 19:07   CT TEMPORAL BONES W WO CONTRAST Result Date: 02/09/2024 EXAM: CT TEMPORAL BONES WITH AND WITHOUT CONTRAST 02/09/2024 05:08:46 PM TECHNIQUE: CT of the temporal bones was performed with and without the administration of intravenous  contrast. 75 mL of iohexol  (OMNIPAQUE ) 350 MG/ML injection was administered. Multiplanar reformatted images are provided for review. Automated exposure control, iterative reconstruction, and/or weight based adjustment of the mA/kV was  utilized to reduce the radiation dose to as low as reasonably achievable. COMPARISON: CT head and MRI brain 02/08/2024. CLINICAL HISTORY: Sepsis with suspected source of suppurative left acute otitis media. FINDINGS: RIGHT TEMPORAL BONE: EXTERNAL AUDITORY CANAL: The right external auditory canal appears mildly narrowed at the junction of the cartilaginous and osseous portions. There is no evidence of mass. Scutum is intact. MIDDLE EAR CAVITY: The right middle ear cavity is clear. The ossicles are normal in position and morphology without erosive change. Normal appearance of the right tympanic membrane. MASTOID AIR CELLS: Moderate right mastoid effusion with significant opacification of mastoid air cells most pronounced posteriorly and inferiorly. There are residual aerated mastoid air cells superiorly. There is partial opacification of the mastoid antrum. Osseous septations of the mastoid air cells are intact. There is no evidence of coalescent mastoiditis. INNER EAR: The cochlea and vestibule are unremarkable. Normal mineralization of the otic capsule. Normal semicircular canals. The vestibular aqueduct is not dilated. INTERNAL AUDITORY CANAL: Unremarkable. Normal bony canal of the facial nerve. LEFT TEMPORAL BONE: EXTERNAL AUDITORY CANAL: There is additional mild soft tissue thickening along the osseous left external auditory canal. The left scutum is intact. No bony erosion. MIDDLE EAR CAVITY: There is complete opacification of the left middle ear with soft tissue fluid surrounding the ossicles. An underlying small mass cannot be excluded; however, there are no erosive changes of the ossicles appreciated. There is likely thickening of the left tympanic membrane, which is partially  obscured by opacification of the left middle ear. There is no evidence of dehiscence of the tegmen tympani or tegmen mastoidium. MASTOID AIR CELLS: Large left mastoid effusion with complete opacification of the mastoid air cells. There is opacification of the mastoid antrum. The osseous septations of the mastoid air cells are intact. There is no evidence of coalescent mastoiditis. INNER EAR: The cochlea and vestibule are unremarkable. Normal mineralization of the otic capsule. Normal semicircular canals. The vestibular aqueduct is not dilated. INTERNAL AUDITORY CANAL: Unremarkable. Normal bony canal of the facial nerve. VASCULAR: Normal jugular bulbs. Normal carotid canals. There is no abnormal enhancement or abnormal soft tissue adjacent to the mastoid temporal bones. There is no evidence of Bezold abscess. BRAIN: Dense calcifications along the visualized falx and tentorium. Unremarkable. ORBITS: No acute abnormality. SINUSES: Mild mucosal thickening in the inferior left maxillary sinus. Additional mild scattered mucosal thickening in the ethmoid sinuses. OTHER FINDINGS: Punctate calcifications in the partially visualized bilateral parotid glands. Degenerative changes of the bilateral temporomandibular joints. IMPRESSION: 1. Findings compatible with left otomastoiditis. No evidence of coalescent mastoiditis, tegmen dehiscence, or adjacent soft tissue abscess. 2. Complete opacification of the left middle ear with surrounding soft tissue/fluid about the ossicles. Small underlying mass cannot be excluded. There is no ossicular erosion. 3. Moderate right mastoid effusion without coalescent mastoiditis. Electronically signed by: Donnice Mania MD 02/09/2024 06:32 PM EST RP Workstation: [id number]   MR Cervical Spine Wo Contrast Result Date: 02/08/2024 EXAM: MRI CERVICAL SPINE WITHOUT CONTRAST 02/08/2024 06:05:08 PM TECHNIQUE: Multiplanar multisequence MRI of the cervical spine was performed. COMPARISON: None  available. CLINICAL HISTORY: Ataxia, nontraumatic, cervical pathology suspected FINDINGS: BONES AND ALIGNMENT: C3-C5 ACDF with C4 corpectomy. Normal alignment. Edema surrounding the C5-C6 disc is likely degenerative/discogenic. RIght C4-C5 perifact edema, also likely degenerative. Otherwise, Bone marrow signal is unremarkable. SPINAL CORD: Normal spinal cord size. No abnormal spinal cord signal. SOFT TISSUES: No paraspinal mass. C2-C3: Ligamentum flavum thickening and mild facet arthropathy. Resulting mild to moderate canal stenosis. C3-C4: ACDF. Ligamentum flavum  thickening and bilateral facet arthropathy. Mild to moderate canal stenosis. Moderate bilateral foraminal stenosis. C4-C5: ACDF. C4 corpectomy. Facter athropathy greater on the right where there also is perifacet edema. Motion limited assessment with widely patent canal and likely patent foramina. C5-C6: Small posterior disc osteophyte complex . Bilateral facet and uncovertebral hypertrophy. Motion-limited assessment with suspected moderate left foraminal stenosis. Patent canal and right foramen. C6-C7: Right .in the left facet and uncovertebral hypertrophy. Right foraminal stenosis. Patent canal and left foramen. C7-T1: No significant disc herniation. No spinal canal stenosis or neural foraminal narrowing. IMPRESSION: 1. C3-C5 ACDF with C4 corpectomy.  Limited evaluation of hardware by MRI. 2. Motion limited study with suspected moderate foraminal stenosis bilaterally at C3-C4 and on the left at C5-C6. Mild to moderate canal stenosis at C2-C3 and C3-C4. 3. RIght C4-C5 perifacet and C5-C6 discogenic endplate edema, likely degenerative in etiology. Electronically signed by: Gilmore Molt 02/08/2024 08:16 PM EST RP Workstation: [id number]   MR BRAIN WO CONTRAST Result Date: 02/08/2024 EXAM: MRI BRAIN WITHOUT CONTRAST 02/08/2024 05:44:06 PM TECHNIQUE: Multiplanar multisequence MRI of the head/brain was performed without the administration of intravenous  contrast. COMPARISON: Same day CT head and comparison MRI brain 05/25/2023. CLINICAL HISTORY: Ataxia, head trauma. FINDINGS: BRAIN AND VENTRICLES: Similar appearance of extensive dural calcifications. T2 and FLAIR hyperintensity in the periventricular and subcortical white matter compatible with mild chronic microvascular ischemic changes. There is a small remote infarct in the left frontal periventricular white matter which is similar to prior. Lacunar infarcts in the left basal ganglia. Remote infarct in the left occipital lobe. No acute infarct. No intracranial hemorrhage. No mass. No midline shift. No hydrocephalus. The sella is unremarkable. Normal flow voids. ORBITS: Bilateral lens replacements. 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 Large bilateral mastoid effusions. Mucosal thickening in the right maxillary sinus. BONES AND SOFT TISSUES: Subtle right frontal scalp soft tissue swelling better evaluated on the same day CT. Partially visualized cervical fusion hardware. Normal marrow signal. IMPRESSION: 1. No acute intracranial abnormality related to the head trauma. 2. Large bilateral mastoid effusions. 3. Small remote infarct in the left frontal periventricular white matter and remote lacunar infarcts in the left basal ganglia. 4. Remote infarct in the left occipital lobe. Electronically signed by: Donnice Mania MD 02/08/2024 06:59 PM EST RP Workstation: [id number]   CT Head Wo Contrast Result Date: 02/08/2024 CLINICAL DATA:  Status post trauma. EXAM: CT HEAD WITHOUT CONTRAST TECHNIQUE: Contiguous axial images were obtained from the base of the skull through the vertex without intravenous contrast. RADIATION DOSE REDUCTION: This exam was performed according to the departmental dose-optimization program which includes automated exposure control, adjustment of the mA and/or kV according to patient size and/or use of iterative reconstruction technique. COMPARISON:   June 09, 2023 FINDINGS: Brain: No evidence of acute infarction, hemorrhage, hydrocephalus, extra-axial collection or mass lesion/mass effect. Stable marked severity pachymeningeal calcification is noted. There is a small chronic left occipital lobe infarct. A chronic left basal gangliar infarct is also seen. Vascular: Marked severity bilateral cavernous carotid artery calcification is noted. Skull: Normal. Negative for fracture or focal lesion. Sinuses/Orbits: No acute finding. Other: There is mild right frontal scalp soft tissue swelling. IMPRESSION: 1. No acute intracranial abnormality. 2. Small chronic left occipital lobe infarct. 3. Chronic left basal gangliar infarct. 4. Mild right frontal scalp soft tissue swelling. Electronically Signed   By: Suzen Dials M.D.   On: 02/08/2024 17:57    Assessment/Plan: Significant right-sided weakness both arm and  leg I do not see anything in his cervical thoracic and lumbar spine to explain this there is some edema around T11-12 cannot rule out small endplate fracture certainly does not appear to be an unstable fracture certainly could be degenerative he has profound scoliosis.  And certainly a T11 process would not affect his right upper extremity.  I do not see significant cord compression in the cervical spine to explain either.  Will continue to treat him for his underlying infection his mastoiditis and work on rehab.  LOS: 0 days     [name] 02/10/2024, 3:22 PM

## 2024-02-10 NOTE — Progress Notes (Signed)
Patient did not show for scheduled appointment today.

## 2024-02-10 NOTE — Plan of Care (Signed)

## 2024-02-10 NOTE — Procedures (Signed)
 HD Note:  Some information was entered later than the data was gathered due to patient care needs. The stated time with the data is accurate.  Received patient in bed to unit.   Alert and oriented.   Informed consent signed and in chart.   Access used: Upper right arm fistula Access issues: None  [name] changed patient's UF goal to 1500 ml during rounds.  Patient tolerated treatment well.   TX duration: 3.5 hours  Alert, without acute distress.  Total UF removed: 1500 ml  Hand-off given to patient's nurse.   Transported back to the room   [name] L. Lenon, RN [facility].

## 2024-02-10 NOTE — Progress Notes (Signed)
 NEUROLOGY CONSULT FOLLOW UP NOTE   Date of service: February 10, 2024 Patient Name: Carl Schorr Sr. MRN:  [id number] DOB:  January 10, 1961  Interval Hx/subjective   No significant changes Vitals   Vitals:   02/09/24 2045 02/09/24 2322 02/10/24 0408 02/10/24 0730  BP: 101/66 94/67 106/70 100/61  Pulse: 81 84 85 92  Resp: 20 20 20 20   Temp: 98.6 F (37 C) 98.6 F (37 C) 98.1 F (36.7 C) 98.6 F (37 C)  TempSrc: Oral  Oral Oral  SpO2: 99% 94% 96% 96%  Weight:      Height:         Body mass index is 25.48 kg/m.  Physical Exam   Constitutional: Appears well-developed and well-nourished.  Neurologic Examination    MS: awake, alert, interactive and appropriate CN: eomi, face symmetric Motor: he does have weakness on the right,  Sensory: reports symmetric sensation to LT  He does have some tenderness over his the mid thoracic area  Medications Current Medications[1]  Labs and Diagnostic Imaging    Imaging(Personally reviewed): MRI   Assessment   Gaetan Spieker Sr. is a 63 y.o. male with right-sided weakness, who suffered a fall with worsening right-sided weakness.  There has not been a clear explanation on MRI of his brain, C-spine, T-spine, or L-spine.  My suspicion is that this is antalgic worsening of underlying deficits.  He does have some cervical narrowing, but not sure if it is enough to cause the symptoms in the setting of a fall, and would defer to neurosurgery.  If he still has worsening deficits in a few weeks an EMG/ncs could be helpful but my suspicion is low for peripheral injury given the lack of sensory changes.  Recommendations  I would discuss management of thoracic MRI findings with neurosurgery PT/OT Neurology will be available as needed. ______________________________________________________________________   Bonney Aisha Seals, MD [facility]      [1]  Current Facility-Administered Medications:    acetaminophen   (TYLENOL ) tablet 650 mg, 650 mg, Oral, Q6H PRN, 650 mg at 02/09/24 1708 **OR** acetaminophen  (TYLENOL ) suppository 650 mg, 650 mg, Rectal, Q6H PRN, Bryn Bernardino NOVAK, MD   aspirin  EC tablet 81 mg, 81 mg, Oral, Daily, Bryn Bernardino NOVAK, MD, 81 mg at 02/09/24 1049   atorvastatin  (LIPITOR) tablet 10 mg, 10 mg, Oral, Daily, Bryn Bernardino B, MD, 10 mg at 02/09/24 1049   calcitRIOL  (ROCALTROL ) capsule 1.5 mcg, 1.5 mcg, Oral, Q M,W,F-HD, Kc, Ramesh, MD   [START ON 02/11/2024] cinacalcet  (SENSIPAR ) tablet 30 mg, 30 mg, Oral, Q breakfast, Kc, Ramesh, MD   ciprofloxacin -dexamethasone  (CIPRODEX ) 0.3-0.1 % OTIC (EAR) suspension 4 drop, 4 drop, Left EAR, BID, Guillermina Hamilton, MD, 4 drop at 02/09/24 2125   clopidogrel  (PLAVIX ) tablet 75 mg, 75 mg, Oral, Daily, Bryn Bernardino B, MD, 75 mg at 02/09/24 1049   cyclobenzaprine  (FLEXERIL ) tablet 10 mg, 10 mg, Oral, TID PRN, Kc, Ramesh, MD   diphenhydrAMINE  (BENADRYL ) capsule 25 mg, 25 mg, Oral, Q6H PRN, Kc, Ramesh, MD   fentaNYL  (SUBLIMAZE ) injection 50 mcg, 50 mcg, Intravenous, Q2H PRN, Opyd, Neithan S, MD   gabapentin  (NEURONTIN ) capsule 300 mg, 300 mg, Oral, QHS, Kc, Ramesh, MD, 300 mg at 02/09/24 2125   heparin  injection 3,000 Units, 3,000 Units, Dialysis, PRN, Ejigiri, Ogechi Grace, PA-C   heparin  injection 5,000 Units, 5,000 Units, Subcutaneous, Q8H, Bryn Bernardino B, MD, 5,000 Units at 02/10/24 0534   lidocaine  (LIDODERM ) 5 % 1 patch, 1 patch, Transdermal, Q24H, Scott, Rocky SAILOR, PA-C,  1 patch at 02/09/24 1343   lidocaine  (PF) (XYLOCAINE ) 1 % injection 5 mL, 5 mL, Intradermal, PRN, Ejigiri, Ogechi Grace, PA-C   melatonin tablet 5 mg, 5 mg, Oral, QHS PRN, Christobal Guadalajara, MD, 5 mg at 02/09/24 2125   midodrine  (PROAMATINE ) tablet 10 mg, 10 mg, Oral, Q M,W,F-HD, Ejigiri, Maisie Fellows, PA-C   midodrine  (PROAMATINE ) tablet 10 mg, 10 mg, Oral, Q dialysis, Kc, Ramesh, MD   pantoprazole  (PROTONIX ) EC tablet 40 mg, 40 mg, Oral, BID AC, Kc, Ramesh, MD, 40 mg at 02/10/24 9247    pentafluoroprop-tetrafluoroeth (GEBAUERS) aerosol 1 Application, 1 Application, Topical, PRN, Ejigiri, Ogechi Grace, PA-C   piperacillin -tazobactam (ZOSYN ) IVPB 2.25 g, 2.25 g, Intravenous, Q8H, Clair Lynwood CROME, RPH, Last Rate: 100 mL/hr at 02/10/24 0536, 2.25 g at 02/10/24 0536   psyllium (HYDROCIL/METAMUCIL) 1 packet, 1 packet, Oral, Daily, Guillermina Hamilton, MD, 1 packet at 02/09/24 1048   sevelamer  carbonate (RENVELA ) tablet 3,200-4,000 mg, 3,200-4,000 mg, Oral, TID WC, Kc, Ramesh, MD, 3,200 mg at 02/10/24 0752   sodium chloride  flush (NS) 0.9 % injection 3 mL, 3 mL, Intravenous, Q12H, Bryn Motto B, MD, 3 mL at 02/09/24 1049

## 2024-02-10 NOTE — Progress Notes (Signed)
 Transition of Care [facility]) - Inpatient Brief Assessment   Patient Details  Name: Carl Mayberry Sr. MRN: [id number] Date of Birth: 09/19/1960  [facility]) CM/SW Contact:    Rosaline JONELLE Joe, RN Phone Number: 02/10/2024, 3:57 PM   Clinical Narrative: Patient admitted to the hospital with Sepsis.  Patient lives at home and has family support.  DME at the home includes Rw, WC and toilet riser.  Patient was seen by PT and Rehab consult placed.  CIR screened the patient and is currently being followed by CIR for possible admission.   Transition of Care Asessment: Insurance and Status: (P) Insurance coverage has been reviewed Patient has primary care physician: (P) Yes Home environment has been reviewed: (P) from home with family Prior level of function:: (P) 24 hour assistance from family Prior/Current Home Services: (P) No current home services Social Drivers of Health Review: (P) SDOH reviewed needs interventions Readmission risk has been reviewed: (P) Yes Transition of care needs: (P) transition of care needs identified, TOC will continue to follow

## 2024-02-11 DIAGNOSIS — E1165 Type 2 diabetes mellitus with hyperglycemia: Secondary | ICD-10-CM | POA: Diagnosis present

## 2024-02-11 DIAGNOSIS — D62 Acute posthemorrhagic anemia: Secondary | ICD-10-CM | POA: Diagnosis not present

## 2024-02-11 DIAGNOSIS — I739 Peripheral vascular disease, unspecified: Secondary | ICD-10-CM | POA: Diagnosis not present

## 2024-02-11 DIAGNOSIS — R531 Weakness: Secondary | ICD-10-CM | POA: Diagnosis not present

## 2024-02-11 DIAGNOSIS — M5185 Other intervertebral disc disorders, thoracolumbar region: Secondary | ICD-10-CM | POA: Diagnosis not present

## 2024-02-11 DIAGNOSIS — X58XXXS Exposure to other specified factors, sequela: Secondary | ICD-10-CM | POA: Diagnosis present

## 2024-02-11 DIAGNOSIS — Z01810 Encounter for preprocedural cardiovascular examination: Secondary | ICD-10-CM | POA: Diagnosis not present

## 2024-02-11 DIAGNOSIS — D631 Anemia in chronic kidney disease: Secondary | ICD-10-CM | POA: Diagnosis present

## 2024-02-11 DIAGNOSIS — M00861 Arthritis due to other bacteria, right knee: Secondary | ICD-10-CM | POA: Diagnosis not present

## 2024-02-11 DIAGNOSIS — K5901 Slow transit constipation: Secondary | ICD-10-CM | POA: Diagnosis not present

## 2024-02-11 DIAGNOSIS — L0291 Cutaneous abscess, unspecified: Secondary | ICD-10-CM | POA: Diagnosis not present

## 2024-02-11 DIAGNOSIS — T8459XA Infection and inflammatory reaction due to other internal joint prosthesis, initial encounter: Secondary | ICD-10-CM | POA: Diagnosis present

## 2024-02-11 DIAGNOSIS — Z96611 Presence of right artificial shoulder joint: Secondary | ICD-10-CM | POA: Diagnosis not present

## 2024-02-11 DIAGNOSIS — Z1152 Encounter for screening for COVID-19: Secondary | ICD-10-CM | POA: Diagnosis not present

## 2024-02-11 DIAGNOSIS — E1151 Type 2 diabetes mellitus with diabetic peripheral angiopathy without gangrene: Secondary | ICD-10-CM | POA: Diagnosis present

## 2024-02-11 DIAGNOSIS — E1140 Type 2 diabetes mellitus with diabetic neuropathy, unspecified: Secondary | ICD-10-CM | POA: Diagnosis present

## 2024-02-11 DIAGNOSIS — E1169 Type 2 diabetes mellitus with other specified complication: Secondary | ICD-10-CM | POA: Diagnosis not present

## 2024-02-11 DIAGNOSIS — E785 Hyperlipidemia, unspecified: Secondary | ICD-10-CM | POA: Diagnosis present

## 2024-02-11 DIAGNOSIS — L89150 Pressure ulcer of sacral region, unstageable: Secondary | ICD-10-CM | POA: Diagnosis not present

## 2024-02-11 DIAGNOSIS — E8889 Other specified metabolic disorders: Secondary | ICD-10-CM | POA: Diagnosis present

## 2024-02-11 DIAGNOSIS — I120 Hypertensive chronic kidney disease with stage 5 chronic kidney disease or end stage renal disease: Secondary | ICD-10-CM | POA: Diagnosis present

## 2024-02-11 DIAGNOSIS — I330 Acute and subacute infective endocarditis: Secondary | ICD-10-CM | POA: Diagnosis not present

## 2024-02-11 DIAGNOSIS — Y831 Surgical operation with implant of artificial internal device as the cause of abnormal reaction of the patient, or of later complication, without mention of misadventure at the time of the procedure: Secondary | ICD-10-CM | POA: Diagnosis present

## 2024-02-11 DIAGNOSIS — Z87891 Personal history of nicotine dependence: Secondary | ICD-10-CM | POA: Diagnosis not present

## 2024-02-11 DIAGNOSIS — N186 End stage renal disease: Secondary | ICD-10-CM | POA: Diagnosis present

## 2024-02-11 DIAGNOSIS — H748X3 Other specified disorders of middle ear and mastoid, bilateral: Secondary | ICD-10-CM | POA: Diagnosis not present

## 2024-02-11 DIAGNOSIS — R5381 Other malaise: Secondary | ICD-10-CM | POA: Diagnosis not present

## 2024-02-11 DIAGNOSIS — F32A Depression, unspecified: Secondary | ICD-10-CM | POA: Diagnosis present

## 2024-02-11 DIAGNOSIS — D638 Anemia in other chronic diseases classified elsewhere: Secondary | ICD-10-CM | POA: Diagnosis not present

## 2024-02-11 DIAGNOSIS — I351 Nonrheumatic aortic (valve) insufficiency: Secondary | ICD-10-CM | POA: Diagnosis not present

## 2024-02-11 DIAGNOSIS — T8459XD Infection and inflammatory reaction due to other internal joint prosthesis, subsequent encounter: Secondary | ICD-10-CM | POA: Diagnosis not present

## 2024-02-11 DIAGNOSIS — H66012 Acute suppurative otitis media with spontaneous rupture of ear drum, left ear: Secondary | ICD-10-CM | POA: Diagnosis present

## 2024-02-11 DIAGNOSIS — I959 Hypotension, unspecified: Secondary | ICD-10-CM | POA: Diagnosis not present

## 2024-02-11 DIAGNOSIS — E119 Type 2 diabetes mellitus without complications: Secondary | ICD-10-CM | POA: Diagnosis not present

## 2024-02-11 DIAGNOSIS — Z981 Arthrodesis status: Secondary | ICD-10-CM | POA: Diagnosis not present

## 2024-02-11 DIAGNOSIS — E871 Hypo-osmolality and hyponatremia: Secondary | ICD-10-CM | POA: Diagnosis present

## 2024-02-11 DIAGNOSIS — F418 Other specified anxiety disorders: Secondary | ICD-10-CM | POA: Diagnosis not present

## 2024-02-11 DIAGNOSIS — Z794 Long term (current) use of insulin: Secondary | ICD-10-CM | POA: Diagnosis not present

## 2024-02-11 DIAGNOSIS — I10 Essential (primary) hypertension: Secondary | ICD-10-CM | POA: Diagnosis not present

## 2024-02-11 DIAGNOSIS — M00061 Staphylococcal arthritis, right knee: Secondary | ICD-10-CM | POA: Diagnosis not present

## 2024-02-11 DIAGNOSIS — Y92009 Unspecified place in unspecified non-institutional (private) residence as the place of occurrence of the external cause: Secondary | ICD-10-CM | POA: Diagnosis not present

## 2024-02-11 DIAGNOSIS — A419 Sepsis, unspecified organism: Secondary | ICD-10-CM | POA: Diagnosis present

## 2024-02-11 DIAGNOSIS — G4733 Obstructive sleep apnea (adult) (pediatric): Secondary | ICD-10-CM | POA: Diagnosis present

## 2024-02-11 DIAGNOSIS — E875 Hyperkalemia: Secondary | ICD-10-CM | POA: Diagnosis present

## 2024-02-11 DIAGNOSIS — Z7902 Long term (current) use of antithrombotics/antiplatelets: Secondary | ICD-10-CM | POA: Diagnosis not present

## 2024-02-11 DIAGNOSIS — Z992 Dependence on renal dialysis: Secondary | ICD-10-CM | POA: Diagnosis not present

## 2024-02-11 DIAGNOSIS — G992 Myelopathy in diseases classified elsewhere: Secondary | ICD-10-CM | POA: Diagnosis present

## 2024-02-11 DIAGNOSIS — Z96619 Presence of unspecified artificial shoulder joint: Secondary | ICD-10-CM | POA: Diagnosis not present

## 2024-02-11 DIAGNOSIS — Z7682 Awaiting organ transplant status: Secondary | ICD-10-CM | POA: Diagnosis not present

## 2024-02-11 DIAGNOSIS — R29898 Other symptoms and signs involving the musculoskeletal system: Secondary | ICD-10-CM | POA: Diagnosis not present

## 2024-02-11 DIAGNOSIS — W19XXXA Unspecified fall, initial encounter: Secondary | ICD-10-CM | POA: Diagnosis present

## 2024-02-11 DIAGNOSIS — M009 Pyogenic arthritis, unspecified: Secondary | ICD-10-CM | POA: Diagnosis present

## 2024-02-11 DIAGNOSIS — K5900 Constipation, unspecified: Secondary | ICD-10-CM | POA: Diagnosis not present

## 2024-02-11 DIAGNOSIS — N2581 Secondary hyperparathyroidism of renal origin: Secondary | ICD-10-CM | POA: Diagnosis present

## 2024-02-11 DIAGNOSIS — R739 Hyperglycemia, unspecified: Secondary | ICD-10-CM | POA: Diagnosis not present

## 2024-02-11 DIAGNOSIS — M25511 Pain in right shoulder: Secondary | ICD-10-CM | POA: Diagnosis not present

## 2024-02-11 DIAGNOSIS — E1122 Type 2 diabetes mellitus with diabetic chronic kidney disease: Secondary | ICD-10-CM | POA: Diagnosis present

## 2024-02-11 LAB — RENAL FUNCTION PANEL
Albumin: 3.1 g/dL — ABNORMAL LOW (ref 3.5–5.0)
Anion gap: 16 — ABNORMAL HIGH (ref 5–15)
BUN: 47 mg/dL — ABNORMAL HIGH (ref 8–23)
CO2: 28 mmol/L (ref 22–32)
Calcium: 10.1 mg/dL (ref 8.9–10.3)
Chloride: 90 mmol/L — ABNORMAL LOW (ref 98–111)
Creatinine, Ser: 7.86 mg/dL — ABNORMAL HIGH (ref 0.61–1.24)
GFR, Estimated: 7 mL/min — ABNORMAL LOW
Glucose, Bld: 84 mg/dL (ref 70–99)
Phosphorus: 5 mg/dL — ABNORMAL HIGH (ref 2.5–4.6)
Potassium: 4.9 mmol/L (ref 3.5–5.1)
Sodium: 134 mmol/L — ABNORMAL LOW (ref 135–145)

## 2024-02-11 LAB — HEPATITIS B SURFACE ANTIBODY, QUANTITATIVE: Hep B S AB Quant (Post): 22.5 m[IU]/mL

## 2024-02-11 MED ORDER — LIDOCAINE 5 % EX PTCH
1.0000 | MEDICATED_PATCH | CUTANEOUS | Status: DC
  Administered 2024-02-11 – 2024-03-03 (×12): 1 via TRANSDERMAL
  Filled 2024-02-11 (×18): qty 1

## 2024-02-11 MED ORDER — SEVELAMER CARBONATE 800 MG PO TABS
3200.0000 mg | ORAL_TABLET | Freq: Three times a day (TID) | ORAL | Status: DC
  Administered 2024-02-11 – 2024-02-13 (×5): 3200 mg via ORAL
  Filled 2024-02-11 (×4): qty 4

## 2024-02-11 MED ORDER — RENA-VITE PO TABS
1.0000 | ORAL_TABLET | Freq: Every day | ORAL | Status: DC
  Administered 2024-02-11 – 2024-03-02 (×20): 1 via ORAL
  Filled 2024-02-11 (×21): qty 1

## 2024-02-11 MED ORDER — PROSOURCE PLUS PO LIQD
30.0000 mL | Freq: Two times a day (BID) | ORAL | Status: DC
  Administered 2024-02-11 – 2024-03-03 (×33): 30 mL via ORAL
  Filled 2024-02-11 (×34): qty 30

## 2024-02-11 NOTE — Consult Note (Signed)
 "     Physical Medicine and Rehabilitation Consult Reason for Consult:CIR? Referring Physician: Dr Raj   HPI: Carl Freiberger Sr. is a 63 y.o. male  Jehovah's witness with ESRD, PAD with critical RLE ischemia s/p stent; and C3/5 fusion for cervical myelopathy in 4/25- pt admitted with R sided weakness and multiple falls- pt felt was fast, but occurred over 3-5 days that got weaker and weaker- was driving and doing his own self care and walking with cane 2 weeks prior to admission.   R reports things got worse after most recent fall 12/23- when knee on R gave out on him.  Also he had seen Dr [name] who suggested R shoulder revision- but pt wanted to wait to see if knee would get better.   Said neck is really hurting- and feels he needs something done. I explained that pain is not a reason to get surgery on neck but it does indicate the known cervical myelopathy he has.   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)    no residual   Past Surgical History:  Procedure Laterality Date   ABDOMINAL AORTOGRAM W/LOWER EXTREMITY N/A 12/09/2023   Procedure: ABDOMINAL AORTOGRAM W/LOWER EXTREMITY;  Surgeon: Sheree Penne Bruckner, MD;  Location: [facility] INVASIVE CV LAB;  Service: Cardiovascular;  Laterality: N/A;   ANTERIOR CERVICAL CORPECTOMY N/A 05/25/2023   Procedure: ANTERIOR CERVICAL CORPECTOMY CERVICAL FOUR, CERVICAL THREE - CERVICAL FIVE FUSION;  Surgeon: [name] Shove, MD;  Location: [facility] OR;  Service: Neurosurgery;  Laterality: N/A;   AV FISTULA PLACEMENT Right    right arm   BIOPSY  08/01/2019   Procedure: BIOPSY;  Surgeon: Kristie Lamprey, MD;  Location: [facility] ENDOSCOPY;  Service: Endoscopy;;   BIOPSY  11/18/2020   Procedure: BIOPSY;  Surgeon: Stacia Glendia BRAVO, MD;  Location: [facility] ENDOSCOPY;  Service: Gastroenterology;;   BIOPSY  09/13/2021   Procedure: BIOPSY;  Surgeon: Wilhelmenia Aloha Raddle., MD;  Location: [facility] ENDOSCOPY;  Service: Gastroenterology;;   CATARACT EXTRACTION  W/ INTRAOCULAR LENS  IMPLANT, BILATERAL     COLON SURGERY     COLONOSCOPY N/A 08/04/2015   Procedure: COLONOSCOPY;  Surgeon: Gordy CHRISTELLA Starch, MD;  Location: [facility] ENDOSCOPY;  Service: Endoscopy;  Laterality: N/A;   COLONOSCOPY  2017   JMP@ [facility]-good prep-mass -recall 1 yr   COLONOSCOPY N/A 09/13/2021   Procedure: COLONOSCOPY;  Surgeon: Mansouraty, Aloha Raddle., MD;  Location: [facility] ENDOSCOPY;  Service: Gastroenterology;  Laterality: N/A;   COLONOSCOPY WITH PROPOFOL  N/A 11/25/2020   Procedure: COLONOSCOPY WITH PROPOFOL ;  Surgeon: Federico Rosario BROCKS, MD;  Location: [facility] ENDOSCOPY;  Service: Gastroenterology;  Laterality: N/A;   COLONOSCOPY WITH PROPOFOL  N/A 09/23/2021   Procedure: COLONOSCOPY WITH PROPOFOL ;  Surgeon: San Sandor GAILS, DO;   Location: [facility] ENDOSCOPY;  Service: Gastroenterology;  Laterality: N/A;   ENTEROSCOPY N/A 09/23/2021   Procedure: ENTEROSCOPY;  Surgeon: San Sandor GAILS, DO;  Location: [facility] ENDOSCOPY;  Service: Gastroenterology;  Laterality: N/A;  Will place order for video capsule study as we may opt to place it during procedure   ESOPHAGOGASTRODUODENOSCOPY N/A 08/01/2019   Procedure: ESOPHAGOGASTRODUODENOSCOPY (EGD);  Surgeon: Kristie Lamprey, MD;  Location: [facility] ENDOSCOPY;  Service: Endoscopy;  Laterality: N/A;   ESOPHAGOGASTRODUODENOSCOPY N/A 11/18/2020   Procedure: ESOPHAGOGASTRODUODENOSCOPY (EGD);  Surgeon: Stacia Glendia BRAVO, MD;  Location: [facility] ENDOSCOPY;  Service: Gastroenterology;  Laterality: N/A;   ESOPHAGOGASTRODUODENOSCOPY (EGD) WITH PROPOFOL  N/A 08/04/2019   Procedure: ESOPHAGOGASTRODUODENOSCOPY (EGD) WITH PROPOFOL ;  Surgeon: Leigh Elspeth SQUIBB, MD;  Location: [facility] ENDOSCOPY;  Service: Gastroenterology;  Laterality: N/A;   ESOPHAGOGASTRODUODENOSCOPY (EGD) WITH PROPOFOL  N/A 09/13/2021   Procedure: ESOPHAGOGASTRODUODENOSCOPY (EGD) WITH PROPOFOL ;  Surgeon: Wilhelmenia Aloha Raddle., MD;  Location: [facility] ENDOSCOPY;  Service: Gastroenterology;  Laterality: N/A;   GIVENS CAPSULE STUDY N/A 09/23/2021   Procedure: GIVENS CAPSULE STUDY;  Surgeon: San Sandor GAILS, DO;  Location: [facility] ENDOSCOPY;  Service: Gastroenterology;  Laterality: N/A;   graft left arm Left    for dialysis x 2. Removed   HOT HEMOSTASIS  11/18/2020   Procedure: HOT HEMOSTASIS (ARGON PLASMA COAGULATION/BICAP);  Surgeon: Stacia Glendia BRAVO, MD;  Location: [facility] ENDOSCOPY;  Service: Gastroenterology;;   HOT HEMOSTASIS N/A 09/23/2021   Procedure: HOT HEMOSTASIS (ARGON PLASMA COAGULATION/BICAP);  Surgeon: San Sandor GAILS, DO;  Location: [facility] ENDOSCOPY;  Service: Gastroenterology;  Laterality: N/A;   INSERTION OF DIALYSIS CATHETER     Rt chest   LAPAROSCOPIC RIGHT COLECTOMY N/A 08/05/2015   Procedure: LAPAROSCOPIC RIGHT COLECTOMY- ASCENDING;  Surgeon: Jina Nephew, MD;  Location: [facility] OR;  Service: General;  Laterality: N/A;   LOWER EXTREMITY ANGIOGRAPHY N/A 12/09/2023   Procedure: Lower Extremity Angiography;  Surgeon: Sheree Penne Bruckner, MD;  Location: [facility] INVASIVE CV LAB;  Service: Cardiovascular;  Laterality: N/A;   LOWER EXTREMITY INTERVENTION N/A 12/09/2023   Procedure: LOWER EXTREMITY INTERVENTION;  Surgeon: Sheree Penne Bruckner, MD;  Location: [facility] INVASIVE CV LAB;  Service: Cardiovascular;  Laterality: N/A;   MASS EXCISION Left 05/28/2019   Procedure: EXCISION SOFT TISSUE MASS LEFT SHOULDER;  Surgeon: Eletha Boas, MD;  Location: [facility] ORS;  Service: General;  Laterality: Left;   NEPHRECTOMY Bilateral    PARATHYROIDECTOMY N/A 06/12/2016   Procedure: TOTAL PARATHYROIDECTOMY WITH AUTOTRANSPLANTATION TO LEFT FOREARM;  Surgeon: Boas Eletha, MD;  Location: [facility] OR;  Service: General;  Laterality: N/A;   PARATHYROIDECTOMY N/A 10/23/2016   Procedure: PARATHYROIDECTOMY;  Surgeon: Eletha Boas, MD;  Location: [facility] OR;  Service: General;  Laterality: N/A;   REVERSE SHOULDER ARTHROPLASTY Right 08/24/2020   Procedure: REVERSE SHOULDER ARTHROPLASTY;  Surgeon: Cristy,  Bonner DASEN, MD;  Location: [facility] OR;  Service: Orthopedics;  Laterality: Right;   REVISON OF ARTERIOVENOUS FISTULA Right 07/16/2017   Procedure: REVISION OF ARTERIOVENOUS FISTULA  Right ARM;  Surgeon: Sheree Penne Bruckner, MD;  Location: [facility] OR;  Service: Vascular;  Laterality: Right;   TOTAL HIP ARTHROPLASTY Left 11/14/2020   Procedure: LEFT TOTAL HIP ARTHROPLASTY ANTERIOR APPROACH;  Surgeon: [name] Kay HERO, MD;  Location: [facility] OR;  Service: Orthopedics;  Laterality: Left;   UPPER GASTROINTESTINAL ENDOSCOPY  2021   @ [facility]   Family History  Problem Relation Age of Onset   Diabetes Father    Stroke Father    Hypertension Father    Uterine cancer Mother    Lupus Sister    Stroke Sister    Hypertension Sister    Anuerysm Brother        brain   Colon cancer Neg Hx    Esophageal cancer Neg  Hx    Stomach cancer Neg Hx    Pancreatic cancer Neg Hx    Liver disease Neg Hx    Colon polyps Neg Hx    Rectal cancer Neg Hx    Social History:  reports that he quit smoking about 33 years ago. His smoking use included cigarettes. He has never used smokeless tobacco. He reports that he does not currently use alcohol after a past usage of about 1.0 standard drink of alcohol per week. He reports that he does not currently use drugs after having used the following drugs: Marijuana. Allergies: Allergies[1] Medications Prior to Admission  Medication Sig Dispense Refill   acetaminophen  (TYLENOL ) 650 MG CR tablet Take 1,300 mg by mouth 2 (two) times daily.     amoxicillin  (AMOXIL ) 500 MG capsule TAKE FOUR CAPSULES BY MOUTH ONE HOUR BEFORE DENTAL WORK/PROCEDURE 8 capsule 0   Aspirin -Caffeine  (BAYER BACK & BODY PAIN EX ST PO) Take 2 tablets by mouth 2 (two) times daily as needed (pain).     augmented betamethasone  dipropionate (DIPROLENE -AF) 0.05 % ointment Apply 1 Application topically 2 (two) times daily as needed for rash.     CALCITRIOL  PO Take 2 mcg by mouth See admin instructions. Medication provided and administered by [facility] dialysis clinic. Give 2mcg by mouth 3 times per week week with every dialysis treatment.     clopidogrel  (PLAVIX ) 75 MG tablet Take 1 tablet (75 mg total) by mouth daily. 30 tablet 11   diphenhydrAMINE  (BENADRYL ) 25 mg capsule Take 1 capsule (25 mg total) by mouth every 6 (six) hours as needed for itching.     gabapentin  (NEURONTIN ) 300 MG capsule Take 300 mg by mouth daily.     heparin  sodium, porcine, 1000 UNIT/ML injection Inject 3,000 Units into the vein See admin instructions. Medication provided and administered by [facility] dialysis clinic. Bolus; IV push 3000u with every dialysis treatment 3 times per week (total treatment 240 minutes).     ibuprofen (ADVIL) 800 MG tablet Take 800 mg by mouth 2 (two) times daily as needed (pain).     lidocaine  (LIDODERM ) 5 %  Place 1 patch onto the skin daily. Remove & Discard patch within 12 hours 30 patch 0   LOKELMA  10 g PACK packet Take 1 packet by mouth daily.     midodrine  (PROAMATINE ) 10 MG tablet Take 1 tablet (10 mg total) by mouth every dialysis (30 min before each dialysis.). 15 tablet 5   Misc Natural Products (TURMERIC, CURCUMIN, PO) Take 1 tablet by mouth daily.     multivitamin (  RENA-VIT) TABS tablet Take 1 tablet by mouth daily.     ofloxacin  (FLOXIN ) 0.3 % OTIC solution Place 10 drops into the left ear daily for 7 days. 10 mL 0   Psyllium (METAMUCIL PO) Take 1 Scoop by mouth daily as needed (GI issues).     sevelamer  carbonate (RENVELA ) 800 MG tablet Take 3,200 mg by mouth 3 (three) times daily with meals.     amoxicillin -clavulanate (AUGMENTIN ) 875-125 MG tablet Take 1 tablet by mouth 2 (two) times daily. 10 day course. (Patient not taking: Reported on 02/11/2024)     atorvastatin  (LIPITOR) 10 MG tablet Take 1 tablet (10 mg total) by mouth every Thursday. (Patient not taking: Reported on 02/11/2024) 30 tablet 2   cyclobenzaprine  (FLEXERIL ) 10 MG tablet Take 1 tablet (10 mg total) by mouth 3 (three) times daily as needed for muscle spasms. (Patient not taking: Reported on 02/11/2024) 30 tablet 0   gabapentin  (NEURONTIN ) 100 MG capsule Take 3 capsules (300 mg total) by mouth at bedtime. (Patient not taking: Reported on 02/11/2024) 30 capsule 2    Home: Home Living Family/patient expects to be discharged to:: Private residence Living Arrangements: Children Available Help at Discharge: Family, Available 24 hours/day Type of Home: Apartment Home Access: Level entry Home Layout: One level Bathroom Shower/Tub: Engineer, Manufacturing Systems: Standard (+ toilet riser) Bathroom Accessibility: Yes Home Equipment: Agricultural Consultant (2 wheels), Grab bars - tub/shower, Government social research officer, Wheelchair - manual  Lives With: Son  Functional History: Prior Function Prior Level of Function : Independent/Modified  Independent Mobility Comments: Mod I using RW; x3 falls in past month due to R knee buckling ADLs Comments: Mod I ADLs, was driving but difficulty driving lately due to R leg not functioning properly Functional Status:  Mobility: Bed Mobility Overal bed mobility: Needs Assistance Bed Mobility: Supine to Sit Supine to sit: Mod assist, HOB elevated Sit to supine: Mod assist, HOB elevated General bed mobility comments: mod A for RLE off EOB and truncal elevation Transfers Overall transfer level: Needs assistance Equipment used: Rolling walker (2 wheels) Transfers: Sit to/from Stand, Bed to chair/wheelchair/BSC Sit to Stand: Mod assist, +2 physical assistance, +2 safety/equipment, From elevated surface Bed to/from chair/wheelchair/BSC transfer type:: Step pivot Step pivot transfers: Mod assist, +2 physical assistance, +2 safety/equipment, From elevated surface General transfer comment: R knee blocked with STS and steps. Poor progression of LLE despite R knee block needing dense cues for taking larger amplitude steps Ambulation/Gait Ambulation/Gait assistance: Max assist Gait Distance (Feet): 2 Feet Assistive device: Rolling walker (2 wheels) Gait Pattern/deviations: Step-to pattern, Decreased step length - right, Decreased step length - left, Decreased stride length, Knees buckling, Trunk flexed General Gait Details: unable to progress Gait velocity: reduced Gait velocity interpretation: <1.31 ft/sec, indicative of household ambulator Pre-gait activities: Pt took small steps from EOB to recliner with R knee blocked throughout; slight improved with incerased time up but continued to require knee block Mod A +2 for wgt shifting, stepping and balance/LE strength.    ADL: ADL Overall ADL's : Needs assistance/impaired Eating/Feeding: Set up, Bed level Grooming: Contact guard assist, Sitting Upper Body Bathing: Sitting, Minimal assistance Lower Body Bathing: Maximal assistance, Sit to/from  stand, +2 for safety/equipment, +2 for physical assistance Upper Body Dressing : Minimal assistance, Sitting Lower Body Dressing: Maximal assistance, +2 for physical assistance, +2 for safety/equipment Toilet Transfer: Moderate assistance, +2 for physical assistance, +2 for safety/equipment, Stand-pivot, Rolling walker (2 wheels), BSC/3in1 Functional mobility during ADLs: +2 for safety/equipment, +2 for physical  assistance, Moderate assistance  Cognition: Cognition Overall Cognitive Status: History of cognitive impairments - at baseline Arousal/Alertness: Awake/alert Orientation Level: Oriented X4 Attention: Sustained Sustained Attention: Impaired Sustained Attention Impairment: Verbal basic Memory: Impaired Memory Impairment: Decreased short term memory, Decreased recall of new information Decreased Short Term Memory: Verbal basic Awareness: Appears intact Problem Solving: Appears intact Safety/Judgment: Appears intact Cognition Arousal: Alert Behavior During Therapy: WFL for tasks assessed/performed Overall Cognitive Status: History of cognitive impairments - at baseline  Blood pressure (!) 117/53, pulse 86, temperature 98.9 F (37.2 C), resp. rate 18, height 6' 1 (1.854 m), weight 91 kg, SpO2 95%. Physical Exam Vitals and nursing note reviewed.  Constitutional:      Appearance: Normal appearance. He is normal weight.     Comments: Pt sitting up in bedside chair Neck and head tilted a lot to Left, NAD-    HENT:     Head: Normocephalic and atraumatic.     Right Ear: External ear normal.     Left Ear: External ear normal.     Nose: Nose normal.     Mouth/Throat:     Mouth: Mucous membranes are dry.  Eyes:     General:        Right eye: No discharge.        Left eye: No discharge.     Extraocular Movements: Extraocular movements intact.  Neck:     Comments: TTP around neck- paraspinals and scalenes/upper traps and levators B/L Cardiovascular:     Rate and Rhythm:  Normal rate and regular rhythm.     Heart sounds: Normal heart sounds. No murmur heard.    No gallop.     Comments: R distal arm fistula- has thrill Pulmonary:     Effort: Pulmonary effort is normal. No respiratory distress.     Breath sounds: Normal breath sounds. No wheezing, rhonchi or rales.  Abdominal:     General: Bowel sounds are normal. There is no distension.     Palpations: Abdomen is soft.     Tenderness: There is no abdominal tenderness.  Musculoskeletal:     Comments: RUE-  Biceps 4/5; Triceps 4/5; WE 4+/5; Grip 2+/5 and FA 2/5 LUE- Biceps/triceps 4+/5; WE 4/5; Grip 4/5 and FA 4-/5 RLE- HF 2/5; at best; KE 2/5- pain limited and KF 4-/5; DF/PF 4/5 LLE- HF 4/5; KE/KF 4/5; DF/PF 4+/5 Compared to 10/1 appt- more weak distally in RUE and proximally in RLE  Skin:    General: Skin is warm and dry.     Comments:  Some bruising on UEs  Neurological:     Comments: No clonus, but has hoffman's RUE- mild- not in LUE that I could see Hard to assess DTRs due to pain  Psychiatric:        Mood and Affect: Mood normal.        Behavior: Behavior normal.     Results for orders placed or performed during the hospital encounter of 02/08/24 (from the past 24 hours)  Renal function panel     Status: Abnormal   Collection Time: 02/11/24  8:53 AM  Result Value Ref Range   Sodium 134 (L) 135 - 145 mmol/L   Potassium 4.9 3.5 - 5.1 mmol/L   Chloride 90 (L) 98 - 111 mmol/L   CO2 28 22 - 32 mmol/L   Glucose, Bld 84 70 - 99 mg/dL   BUN 47 (H) 8 - 23 mg/dL   Creatinine, Ser 2.13 (H) 0.61 - 1.24 mg/dL  Calcium  10.1 8.9 - 10.3 mg/dL   Phosphorus 5.0 (H) 2.5 - 4.6 mg/dL   Albumin  3.1 (L) 3.5 - 5.0 g/dL   GFR, Estimated 7 (L) >60 mL/min   Anion gap 16 (H) 5 - 15   CT THORACIC SPINE WO CONTRAST Result Date: 02/11/2024 EXAM: CT THORACIC SPINE WITHOUT CONTRAST 02/10/2024 05:21:58 PM TECHNIQUE: CT of the thoracic spine was performed without the administration of intravenous contrast.  Multiplanar reformatted images are provided for review. Automated exposure control, iterative reconstruction, and/or weight based adjustment of the mA/kV was utilized to reduce the radiation dose to as low as reasonably achievable. COMPARISON: MRI of the thoracic spine dated 02/09/2024. CLINICAL HISTORY: Compression fracture, thoracic. FINDINGS: BONES AND ALIGNMENT: Normal vertebral body heights. No acute fracture or suspicious bone lesion. There is moderate dextroscoliosis and moderate kyphosis. Erosions are present within the superior endplates of T11 and T12 anteriorly, likely secondary to degenerative disc disease. The vertebral bodies are diffusely sclerotic, presumably representing renal osteodystrophy. DEGENERATIVE CHANGES: Erosions are present within the superior endplates of T11 and T12 anteriorly, likely secondary to degenerative disc disease. SOFT TISSUES: No acute abnormality. LUNGS: There is mild atelectasis present independently within the lungs bilaterally. IMPRESSION: 1. No acute fracture. 2. Moderate dextroscoliosis and moderate kyphosis. 3. Erosions within the superior endplates of T11 and T12 anteriorly, likely secondary to degenerative disc disease. 4. Diffuse sclerosis of the vertebral bodies, presumably representing renal osteodystrophy. 5. Mild bilateral pulmonary atelectasis. Electronically signed by: Evalene Coho MD 02/11/2024 04:57 AM EST RP Workstation: [id number]   DG Knee 1-2 Views Right Result Date: 02/10/2024 CLINICAL DATA:  Pain. EXAM: RIGHT KNEE - 1-2 VIEW COMPARISON:  06/11/2023 FINDINGS: The bones are subjectively under mineralized. Medial and lateral tibiofemoral joint space narrowing. There is tricompartmental peripheral spurring. Moderate joint effusion. Quadriceps and patellar tendon enthesophytes. No erosive or bony destructive change. Vascular calcifications with vascular stent in place. IMPRESSION: Tricompartmental osteoarthritis with moderate joint effusion.  Electronically Signed   By: Andrea Gasman M.D.   On: 02/10/2024 14:45   MR THORACIC SPINE WO CONTRAST Result Date: 02/09/2024 EXAM: MRI Thoracic Spine Without Intravenous Contrast 02/09/2024 06:28:29 PM TECHNIQUE: Multiplanar multisequence MRI of the thoracic spine was performed without the administration of intravenous contrast. COMPARISON: None available. CLINICAL HISTORY: Mid-back pain FINDINGS: BONES AND ALIGNMENT: Dectrocurvature. Linear edema in the left eccentric T11 and T12 superior endplates could represent degenerative change or acute/subacute fractures without significant height loss. Diffusely low signal within the bone marrow, likely related to known chronic kidney disease. No specific evidence of discitis/osteomyelitis. Degenerative/discogenic changes can play a role and signal changes at multiple levels. SPINAL CORD: Normal spinal cord volume. Normal spinal cord signal. SOFT TISSUES: Unremarkable. DEGENERATIVE CHANGES: Multilevel facet arthropathy and dextrocurvature contributes to foraminal stenosis. This includes moderate right foraminal stenosis at T2 T3 , T8-T9, and T9-T10. Severe left foraminal stenosis at T11-T12. Significant canal stenosis . IMPRESSION: 1. Linear edema in the left eccentric T11 and T12 superior endplates could represent degenerative change or acute/subacute fractures without significant height loss. Correlate with the presence or absence of point tenderness. 2. Severe left foraminal stenosis at T11-T12. 3. Moderate right foraminal stenosis at T2 T3 , T8-T9, and T9-T 4. S-shaped thoracolumbar curvature. 5. No significant canal stenosis. Electronically signed by: Gilmore Molt 02/09/2024 08:20 PM EST RP Workstation: [id number]   MR LUMBAR SPINE WO CONTRAST Result Date: 02/09/2024 EXAM: MRI LUMBAR SPINE 02/09/2024 06:28:15 PM TECHNIQUE: Multiplanar multisequence MRI of the lumbar spine was performed without the administration of  intravenous contrast. COMPARISON: None  available. CLINICAL HISTORY: Low back pain, prior surgery, new symptoms FINDINGS: BONES AND ALIGNMENT: Grade 1 anterolisthesis of L3 on L4. Rotatory levocurvature. Heterogeneous bone marrow which is low in attenuation on T1, likely related to patient's known chronic renal disease. Mild right lateral L3-L4 disc edema and surrounding marrow edema. Normal vertebral body heights. SPINAL CORD: The conus terminates normally. SOFT TISSUES: No paraspinal mass. L1-L2: No significant disc herniation. No spinal canal stenosis or neural foraminal narrowing. L2-L3: Mild disc bulge, right greater than left facet arthropathy. No significant canal or foraminal stenosis. L3-L4: Grade 1 anterolisthesis. Right lateral disc edema and degenerative disc disease with mild associated endplate signal changes. Disc bulging with bilateral facet arthropathy. Severe right foraminal stenosis. Moderate right subarticular recess stenosis. Mild canal stenosis. L4-L5: Disc bulge, right greater than left facet arthropathy. Resulting moderate right foraminal stenosis. Patent canal and left foramen. L5-S1: Disc bulge and endplate spurring. Facet arthropathy resulting in mild left foraminal stenosis. IMPRESSION: 1. At L3-L4, moderate to severe right and mild left foraminal stenosis. Moderate right subarticular recess stenosis and mild canal stenosis. 2. At L4-L5, moderate right foraminal stenosis. 3. Right lateral L3-L4 disc edema and mild surrounding marrow edema which is most likely degenerative in the setting of rotatory levocurvature and severe degenerative changes at this level. Recommend correlation with infectious markers to exclude early discitis, which is thought less likely. Electronically signed by: Gilmore Molt 02/09/2024 08:01 PM EST RP Workstation: [id number]   MR SHOULDER RIGHT WO CONTRAST Result Date: 02/09/2024 CLINICAL DATA:  Pain after fall. 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 EXAM: MRI OF THE RIGHT  SHOULDER WITHOUT CONTRAST TECHNIQUE: Multiplanar, multisequence MR imaging of the shoulder was performed. No intravenous contrast was administered. COMPARISON:  Right shoulder radiographs dated 02/07/2024. FINDINGS: Bones/Joint Status post right reverse shoulder arthroplasty with associated susceptibility artifact which significantly limits evaluation of the surrounding bone and soft tissues. No discrete fracture identified on this limited examination. Soft tissue and Muscles There is a well-circumscribed T2 intermediate to hyperintense and T1 hypointense fluid collection within the posterior subacromial/subdeltoid space measuring approximately 5.1 x 2.9 x 1.9 cm (series 6, image 5 and series 9, image 15), favored to represent a postoperative collection such as a seroma. Severe fatty atrophy of the subscapularis, supraspinatus, and infraspinatus muscles. IMPRESSION: 1. Status post right reverse shoulder arthroplasty with associated susceptibility artifact which significantly limits evaluation of the surrounding bone and soft tissues. No discrete fracture identified on this limited examination. CT could be considered for further evaluation of the reverse shoulder arthroplasty and if there is persistent concern for acute fracture. 2. A well-circumscribed fluid collection within the posterior subacromial/subdeltoid space measuring approximately 5.1 x 2.9 x 1.9 cm is favored to represent a postoperative collection such as a seroma. 3. Severe fatty atrophy of the subscapularis, supraspinatus, and infraspinatus muscles. Electronically Signed   By: Harrietta Sherry M.D.   On: 02/09/2024 19:07   CT TEMPORAL BONES W WO CONTRAST Result Date: 02/09/2024 EXAM: CT TEMPORAL BONES WITH AND WITHOUT CONTRAST 02/09/2024 05:08:46 PM TECHNIQUE: CT of the temporal bones was performed with and without the administration of intravenous contrast. 75 mL of iohexol  (OMNIPAQUE ) 350 MG/ML injection was administered. Multiplanar reformatted  images are provided for review. Automated exposure control, iterative reconstruction, and/or weight based adjustment of the mA/kV was utilized to reduce the radiation dose to as low as reasonably achievable. COMPARISON: CT head and MRI brain 02/08/2024. CLINICAL HISTORY: Sepsis with suspected source of  suppurative left acute otitis media. FINDINGS: RIGHT TEMPORAL BONE: EXTERNAL AUDITORY CANAL: The right external auditory canal appears mildly narrowed at the junction of the cartilaginous and osseous portions. There is no evidence of mass. Scutum is intact. MIDDLE EAR CAVITY: The right middle ear cavity is clear. The ossicles are normal in position and morphology without erosive change. Normal appearance of the right tympanic membrane. MASTOID AIR CELLS: Moderate right mastoid effusion with significant opacification of mastoid air cells most pronounced posteriorly and inferiorly. There are residual aerated mastoid air cells superiorly. There is partial opacification of the mastoid antrum. Osseous septations of the mastoid air cells are intact. There is no evidence of coalescent mastoiditis. INNER EAR: The cochlea and vestibule are unremarkable. Normal mineralization of the otic capsule. Normal semicircular canals. The vestibular aqueduct is not dilated. INTERNAL AUDITORY CANAL: Unremarkable. Normal bony canal of the facial nerve. LEFT TEMPORAL BONE: EXTERNAL AUDITORY CANAL: There is additional mild soft tissue thickening along the osseous left external auditory canal. The left scutum is intact. No bony erosion. MIDDLE EAR CAVITY: There is complete opacification of the left middle ear with soft tissue fluid surrounding the ossicles. An underlying small mass cannot be excluded; however, there are no erosive changes of the ossicles appreciated. There is likely thickening of the left tympanic membrane, which is partially obscured by opacification of the left middle ear. There is no evidence of dehiscence of the tegmen  tympani or tegmen mastoidium. MASTOID AIR CELLS: Large left mastoid effusion with complete opacification of the mastoid air cells. There is opacification of the mastoid antrum. The osseous septations of the mastoid air cells are intact. There is no evidence of coalescent mastoiditis. INNER EAR: The cochlea and vestibule are unremarkable. Normal mineralization of the otic capsule. Normal semicircular canals. The vestibular aqueduct is not dilated. INTERNAL AUDITORY CANAL: Unremarkable. Normal bony canal of the facial nerve. VASCULAR: Normal jugular bulbs. Normal carotid canals. There is no abnormal enhancement or abnormal soft tissue adjacent to the mastoid temporal bones. There is no evidence of Bezold abscess. BRAIN: Dense calcifications along the visualized falx and tentorium. Unremarkable. ORBITS: No acute abnormality. SINUSES: Mild mucosal thickening in the inferior left maxillary sinus. Additional mild scattered mucosal thickening in the ethmoid sinuses. OTHER FINDINGS: Punctate calcifications in the partially visualized bilateral parotid glands. Degenerative changes of the bilateral temporomandibular joints. IMPRESSION: 1. Findings compatible with left otomastoiditis. No evidence of coalescent mastoiditis, tegmen dehiscence, or adjacent soft tissue abscess. 2. Complete opacification of the left middle ear with surrounding soft tissue/fluid about the ossicles. Small underlying mass cannot be excluded. There is no ossicular erosion. 3. Moderate right mastoid effusion without coalescent mastoiditis. Electronically signed by: Donnice Mania MD 02/09/2024 06:32 PM EST RP Workstation: [id number]     Assessment/Plan: Diagnosis: R>L weakness-  with hx of Cervical myelopathy- likely  complicated by need for R TKR and R shoulder revision necessity Does the need for close, 24 hr/day medical supervision in concert with the patient's rehab needs make it unreasonable for this patient to be served in a less intensive  setting? Yes Co-Morbidities requiring supervision/potential complications: L suppurativa otitis media, TM perforation, ESRD, needs for R shoulder surgery revision,  multiple falls at home with R knee effusion and need for R TKR;  PAD s/p stent for RLE for critical ischemia Due to bowel management, safety, skin/wound care, disease management, medication administration, pain management, and patient education, does the patient require 24 hr/day rehab nursing? Yes Does the patient require coordinated care of a  physician, rehab nurse, therapy disciplines of PT and OT to address physical and functional deficits in the context of the above medical diagnosis(es)? Yes Addressing deficits in the following areas: balance, endurance, locomotion, strength, transferring, bathing, dressing, feeding, grooming, and toileting Can the patient actively participate in an intensive therapy program of at least 3 hrs of therapy per day at least 5 days per week? Yes The potential for patient to make measurable gains while on inpatient rehab is good Anticipated functional outcomes upon discharge from inpatient rehab are modified independent and supervision  with PT, supervision with OT, n/a with SLP. Estimated rehab length of stay to reach the above functional goals is: 2-3 weeks Anticipated discharge destination: Home Overall Rehab/Functional Prognosis: good  RECOMMENDATIONS: This patient's condition is appropriate for continued rehabilitative care in the following setting: CIR Patient has agreed to participate in recommended program. Yes Note that insurance prior authorization may be required for reimbursement for recommended care.  Comment:  Spoke with Neuro and primary team- they feel this weakness is due to infection and Recrudescence  in setting along with R shoulder and R knee pain more than anything- pt's exam IS Consistent with increased weakness throughout, but worse on R than L.  Even in hands and FA, which is  not just due to pain, so he really needs rehab to get back to home doing all his own self care.  2. Spoke with Ortho PA- [name]- he was not clear, with me, but sounds like pt could benefit from Joint effusion being tapped due to moderate effusion and increased pain in RLE.  3. Pt also was told be Dr [name] he needed R shoulder revision as well as R THA- obviously that cannot occur right now, but pt was going to call Dr Brion office to see if possible to do shoulder anytime soon.  Per pt, Dr [name] didn't feel like needed to move fistula of RUE.  4. Will submit for inpt rehab/CIR- did d/w Ortho, [facility] , and Neuro about pt as well as admissions coordinator- was also in room with pt 1 hour in addition to everything else done, reviewing chart- my prior notes for pt from April, 2025- til now.     I spent a total of 94   minutes on total care today- >50% coordination of care- due to  D/w pt for 1 hour and exam; review of chart in depth, d/w Neuro, Ortho and [facility]  hospitalist and admissions coordinator. Including documentation  Duwaine Barrs, MD 02/11/2024       [1]  Allergies Allergen Reactions   Infed [Iron Dextran] Other (See Comments)    Decreased BP    Vicodin [Hydrocodone -Acetaminophen ] Other (See Comments)    Decreased BP   Hydrocodone  Other (See Comments)    Unable to recall reaction   Voltaren [Diclofenac] Other (See Comments)    Hypotension    "

## 2024-02-11 NOTE — Progress Notes (Signed)
 Patient ID: Carl Meenach Sr., male   DOB: 04-14-1960, 63 y.o.   MRN: [id number] CAT scan reviewed degenerative process no acute fracture.  No neurosurgical intervention indicated

## 2024-02-11 NOTE — Progress Notes (Addendum)
 " [name] Progress Note   Subjective:   Seen in room. Dialyzed yesterday without issue. No CP/dyspnea. Still can barely move R arm and leg. Appears per notes that there is no surgical intervention planned for him.  Objective Vitals:   02/10/24 2029 02/11/24 0257 02/11/24 0513 02/11/24 0746  BP: 92/60 (!) 88/56 108/65 112/68  Pulse: 96 95 79 71  Resp: 18  17 16   Temp: (!) 100.4 F (38 C) 99.3 F (37.4 C) 98.2 F (36.8 C) 99.3 F (37.4 C)  TempSrc:  Oral    SpO2: 95% 97% 97% 98%  Weight:      Height:       Physical Exam General: Well appearing man, NAD. Room air Heart: RRR Lungs: CTAB Abdomen: soft Extremities: no LE edema Neuro: Weakness R arm and R leg Dialysis Access: AVF   Additional Objective Labs: Basic Metabolic Panel: Recent Labs  Lab 02/09/24 0520 02/10/24 0629 02/11/24 0853  NA 134* 130* 134*  K 5.2* 5.1 4.9  CL 89* 86* 90*  CO2 28 25 28   GLUCOSE 141* 90 84  BUN 61* 74* 47*  CREATININE 9.29* 11.00* 7.86*  CALCIUM  9.8 9.9 10.1  PHOS  --  5.9* 5.0*   Liver Function Tests: Recent Labs  Lab 02/10/24 0629 02/11/24 0853  ALBUMIN  3.1* 3.1*   CBC: Recent Labs  Lab 02/08/24 1201 02/09/24 0520 02/10/24 0629  WBC 11.7* 12.1* 9.3  NEUTROABS 9.4*  --   --   HGB 11.4* 10.1* 10.0*  HCT 35.3* 30.8* 29.7*  MCV 91.7 89.5 88.4  PLT 223 216 229   Blood Culture    Component Value Date/Time   SDES BLOOD BLOOD LEFT WRIST 02/08/2024 2239   SPECREQUEST  02/08/2024 2239    BOTTLES DRAWN AEROBIC AND ANAEROBIC Blood Culture adequate volume   CULT  02/08/2024 2239    NO GROWTH 3 DAYS Performed at [facility], [address], [name], [name]    REPTSTATUS PENDING 02/08/2024 2239   Medications:  piperacillin -tazobactam (ZOSYN )  IV 2.25 g (02/11/24 0638)    aspirin  EC  81 mg Oral Daily   atorvastatin   10 mg Oral Daily   calcitRIOL   1.5 mcg Oral Q M,W,F-HD   cinacalcet   120 mg Oral Q breakfast    ciprofloxacin -dexamethasone   4 drop Left EAR BID   clopidogrel   75 mg Oral Daily   gabapentin   300 mg Oral QHS   heparin   5,000 Units Subcutaneous Q8H   lidocaine   1 patch Transdermal Q24H   midodrine   10 mg Oral TID WC   pantoprazole   40 mg Oral BID AC   psyllium  1 packet Oral Daily   sevelamer  carbonate  3,200-4,000 mg Oral TID WC   sodium chloride  flush  3 mL Intravenous Q12H   Dialysis Orders MWF - NW 4hr, 400/A1.5, EDW 87.3kg, 2K/2Ca bath, AVF, heparin  3000 unit bolus ESA: Mircera 50 q 2 wks (last 12/15) BMM: Calcitriol  2 mcg PO q HD, cinacalcet  120 mg daily    Assessment/Plan: R sided weakness. Hx cervical spine injury/fusion. No acute CVA on MRI, does have significant DDD but no cord compression and no surgical recs per neurosurgery note.  Fevers/ear pain. Blood Cx negative so far. + mastoid effusions on MRI. On IV Zosyn . ESRD: Continue HD on usual MWF schedule - next HD tomorrow. Hyperkalemia: Does take Lokelma  on non-HD days, can give prn here. Hypotension/volume. BP chronically low end - continue midodrine  10mg  with HD. Declines blood  products/albumin . Anemia of ESRD. Hgb stable - due for ESA later this week Metabolic bone disease. CorrCa high, continue sevelamer  binder +sensipar , hold VDRA for now. Nutrition: Alb low, adding supps and renal MVI.   [name], PA-C 02/11/2024, 9:54 AM   [facility]   Seen and examined independently this AM.  Agree with note and exam as documented above by physician extender and as noted here.  General adult male in bed in no acute distress HEENT normocephalic atraumatic extraocular movements intact sclera anicteric Neck supple trachea midline Lungs clear to auscultation bilaterally normal work of breathing at rest  Heart S1S2 no rub Abdomen soft nontender nondistended Extremities no pitting edema  Psych normal mood and affect Access AVF with bruit and thrill   Right sided weakness  - disc disease noted - s/p  NSGY eval and no plans for surgery at this time  - requested assistance for him with eating as he's right-handed  ESRD - HD per MWF schedule  Hypotension  - on midodrine  which we have scheduled here   Anemia of CKD - declines blood products and albumin     [name], MD 02/11/2024  6:42 PM  "

## 2024-02-11 NOTE — Progress Notes (Signed)
" °  Inpatient Rehabilitation Admissions Coordinator   Met with patient, son and daughter at bedside for rehab assessment. We discussed goals and expectations of a possible CIR admit. He was previously at [facility] 4/25 for 46 days and then discharged to [facility] for about 1 to 2 weeks before returning home with son.  They prefer CIR for rehab. Dr Cornelio to consult today.  Family can provide expected caregiver support that is recommended. Once I have Dr Jyl consult, I will begin insurance Auth with [facility] Medicare for possible CIR admit pending approval. Please call me with any questions.   Heron Leavell, RN, MSN Rehab Admissions Coordinator [phone]   "

## 2024-02-11 NOTE — Evaluation (Signed)
 Speech Language Pathology Evaluation Patient Details Name: Carl Coston Sr. MRN: [id number] DOB: 1960-02-14 Today's Date: 02/11/2024 Time: 8983-8954 SLP Time Calculation (min) (ACUTE ONLY): 29 min  Problem List:  Patient Active Problem List   Diagnosis Date Noted   Acute suppurative otitis media of left ear with spontaneous rupture of tympanic membrane 02/09/2024   Mastoid disorder, bilateral 02/09/2024   Conductive hearing loss 02/09/2024   Sepsis (HCC) 02/08/2024   Right knee buckling 11/15/2023   Primary osteoarthritis of right knee 11/15/2023   Abnormality of gait 11/15/2023   Primary osteoarthritis of left knee 10/03/2023   Right hip pain 07/14/2023   Coping style affecting medical condition 06/05/2023   Spinal cord injury, cervical region, sequela 05/30/2023   Cervical vertebral fracture (HCC) 05/25/2023   History of GI bleed 05/25/2023   C4 cervical fracture (HCC) 05/25/2023   Duodenitis    Gastritis and gastroduodenitis    Ischemic colitis    Bright red blood per rectum    ABLA (acute blood loss anemia)    Gastric ulcer    Duodenal ulcer    Diverticulosis of colon without hemorrhage    GI bleed 09/17/2021   Acute upper GI bleeding 09/11/2021   Mucosal abnormality of esophagus    Mucosal abnormality of stomach    Mucosal abnormality of duodenum    Upper GI bleed    Left hip pain 11/16/2020   Type 2 diabetes mellitus with other specified complication (HCC) 11/14/2020   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/14/2020   Primary osteoarthritis of left hip 10/07/2020   Status post reverse total arthroplasty of right shoulder 08/24/2020   Diverticulitis 03/08/2020   ESRD (end stage renal disease) (HCC) 03/08/2020   Polyneuropathy in diseases classified elsewhere 03/08/2020   Pure hypercholesterolemia 03/08/2020   Sacral back pain 03/08/2020   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 11/10/2019   Acute gastric ulcer with hemorrhage    Acute GI bleeding 07/31/2019    Hypotension of hemodialysis 07/31/2019   Diverticulitis large intestine 07/31/2019   Mass of soft tissue of shoulder 05/26/2019   Awaiting organ transplant status 03/18/2019   Finding of cocaine in blood 02/25/2019   Anaphylactic shock, unspecified, sequela 11/21/2018   Hypercalcemia 10/28/2018   Diarrhea, unspecified 08/08/2018   Hyperkalemia 03/12/2018   Renal cell carcinoma of left kidney (HCC) 12/24/2017   Fluid overload, unspecified 07/06/2017   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   Other specified disorders of kidney and ureter 11/22/2016   Secondary hyperparathyroidism of renal origin 10/23/2016   Headache disorder 08/21/2016   TIA (transient ischemic attack)    Essential hypertension    Seizure (HCC)    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    Lung nodule    Diverticulitis of cecum 07/21/2015   Diverticulitis of large intestine without perforation or abscess without bleeding    Right lower quadrant pain    Obstructive sleep apnea 02/08/2015   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   Diverticulosis 07/13/2014   Lower GI bleed 07/12/2014   Refusal of blood transfusions as patient is Jehovah's Witness    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   HLD (hyperlipidemia)    PRES (posterior reversible encephalopathy syndrome)    History of CVA (cerebrovascular accident) 05/23/2014   Anemia of chronic disease 05/23/2014   GERD (gastroesophageal reflux disease) 12/09/2012   Gastrointestinal bleeding 11/12/2012   Melena 11/12/2012   Other psychoactive substance dependence, uncomplicated (HCC) 10/08/2012  Iron deficiency anemia, unspecified 06/10/2012   Moderate protein-calorie malnutrition 07/29/2009   Coagulation defect, unspecified 08/29/2006   Type 2 diabetes mellitus with diabetic peripheral angiopathy without gangrene (HCC) 08/29/2006   Hypertensive chronic kidney disease with stage 5  chronic kidney disease or end stage renal disease (HCC) 08/02/2006   Past Medical History:  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    Parathyroid  abnormality    ectopic parathyroid  gland   Presence of arteriovenous fistula for hemodialysis, primary    RUE PER PT RLE   Refusal of blood product    NO WHOLE BLOOD PROUCTS   Renal cell carcinoma (HCC)    s/p hand assisted laparoscopic bilateral nephrectomies 11/29/17, + RCC left   Secondary hyperparathyroidism    Seizures (HCC)    one episode in past, due to elevated Potassium 08/02/20- at least 4 years ago   Sleep apnea    doesn't use CPAP anymore since weight loss   Stroke [facility])    no residual   Past Surgical History:  Past Surgical History:  Procedure Laterality Date   ABDOMINAL AORTOGRAM W/LOWER EXTREMITY N/A 12/09/2023   Procedure: ABDOMINAL AORTOGRAM W/LOWER EXTREMITY;  Surgeon: Sheree Penne Bruckner, MD;  Location[facility] INVASIVE CV LAB;  Service: Cardiovascular;  Laterality: N/A;   ANTERIOR CERVICAL CORPECTOMY N/A 05/25/2023   Procedure: ANTERIOR CERVICAL CORPECTOMY CERVICAL FOUR, CERVICAL THREE - CERVICAL FIVE FUSION;  Surgeon: [name] Shove, MD;  Location: [facility] OR;  Service: Neurosurgery;  Laterality: N/A;   AV FISTULA PLACEMENT Right    right arm   BIOPSY  08/01/2019   Procedure: BIOPSY;  Surgeon: Kristie Lamprey, MD;  Location: [facility] ENDOSCOPY;  Service: Endoscopy;;   BIOPSY  11/18/2020   Procedure:  BIOPSY;  Surgeon: Stacia Glendia BRAVO, MD;  Location: [facility] ENDOSCOPY;  Service: Gastroenterology;;   BIOPSY  09/13/2021   Procedure: BIOPSY;  Surgeon: Wilhelmenia Aloha Raddle., MD;  Location: [facility] ENDOSCOPY;  Service: Gastroenterology;;   CATARACT EXTRACTION W/ INTRAOCULAR LENS  IMPLANT, BILATERAL     COLON SURGERY     COLONOSCOPY N/A 08/04/2015   Procedure: COLONOSCOPY;  Surgeon: Gordy CHRISTELLA Starch, MD;  Location: [facility] ENDOSCOPY;  Service: Endoscopy;  Laterality: N/A;   COLONOSCOPY  2017   JMP@ [facility]-good prep-mass -recall 1 yr   COLONOSCOPY N/A 09/13/2021   Procedure: COLONOSCOPY;  Surgeon: Mansouraty, Aloha Raddle., MD;  Location: [facility] ENDOSCOPY;  Service: Gastroenterology;  Laterality: N/A;   COLONOSCOPY WITH PROPOFOL  N/A 11/25/2020   Procedure: COLONOSCOPY WITH PROPOFOL ;  Surgeon: Federico Rosario BROCKS, MD;  Location: [facility] ENDOSCOPY;  Service: Gastroenterology;  Laterality: N/A;   COLONOSCOPY WITH PROPOFOL  N/A 09/23/2021   Procedure: COLONOSCOPY WITH PROPOFOL ;  Surgeon: San Sandor GAILS, DO;  Location: [facility] ENDOSCOPY;  Service: Gastroenterology;  Laterality: N/A;   ENTEROSCOPY N/A 09/23/2021  Procedure: ENTEROSCOPY;  Surgeon: San Sandor GAILS, DO;  Location: [facility] ENDOSCOPY;  Service: Gastroenterology;  Laterality: N/A;  Will place order for video capsule study as we may opt to place it during procedure   ESOPHAGOGASTRODUODENOSCOPY N/A 08/01/2019   Procedure: ESOPHAGOGASTRODUODENOSCOPY (EGD);  Surgeon: Kristie Lamprey, MD;  Location: [facility] ENDOSCOPY;  Service: Endoscopy;  Laterality: N/A;   ESOPHAGOGASTRODUODENOSCOPY N/A 11/18/2020   Procedure: ESOPHAGOGASTRODUODENOSCOPY (EGD);  Surgeon: Stacia Glendia BRAVO, MD;  Location: [facility] ENDOSCOPY;  Service: Gastroenterology;  Laterality: N/A;   ESOPHAGOGASTRODUODENOSCOPY (EGD) WITH PROPOFOL  N/A 08/04/2019   Procedure: ESOPHAGOGASTRODUODENOSCOPY (EGD) WITH PROPOFOL ;  Surgeon: Leigh Elspeth SQUIBB, MD;  Location: [facility] ENDOSCOPY;  Service: Gastroenterology;  Laterality: N/A;    ESOPHAGOGASTRODUODENOSCOPY (EGD) WITH PROPOFOL  N/A 09/13/2021   Procedure: ESOPHAGOGASTRODUODENOSCOPY (EGD) WITH PROPOFOL ;  Surgeon: Wilhelmenia Aloha Raddle., MD;  Location: [facility] ENDOSCOPY;  Service: Gastroenterology;  Laterality: N/A;   GIVENS CAPSULE STUDY N/A 09/23/2021   Procedure: GIVENS CAPSULE STUDY;  Surgeon: San Sandor GAILS, DO;  Location: [facility] ENDOSCOPY;  Service: Gastroenterology;  Laterality: N/A;   graft left arm Left    for dialysis x 2. Removed   HOT HEMOSTASIS  11/18/2020   Procedure: HOT HEMOSTASIS (ARGON PLASMA COAGULATION/BICAP);  Surgeon: Stacia Glendia BRAVO, MD;  Location: [facility] ENDOSCOPY;  Service: Gastroenterology;;   HOT HEMOSTASIS N/A 09/23/2021   Procedure: HOT HEMOSTASIS (ARGON PLASMA COAGULATION/BICAP);  Surgeon: San Sandor GAILS, DO;  Location: [facility] ENDOSCOPY;  Service: Gastroenterology;  Laterality: N/A;   INSERTION OF DIALYSIS CATHETER     Rt chest   LAPAROSCOPIC RIGHT COLECTOMY N/A 08/05/2015   Procedure: LAPAROSCOPIC RIGHT COLECTOMY- ASCENDING;  Surgeon: Jina Nephew, MD;  Location: [facility] OR;  Service: General;  Laterality: N/A;   LOWER EXTREMITY ANGIOGRAPHY N/A 12/09/2023   Procedure: Lower Extremity Angiography;  Surgeon: Sheree Penne Bruckner, MD;  Location: [facility] INVASIVE CV LAB;  Service: Cardiovascular;  Laterality: N/A;   LOWER EXTREMITY INTERVENTION N/A 12/09/2023   Procedure: LOWER EXTREMITY INTERVENTION;  Surgeon: Sheree Penne Bruckner, MD;  Location: [facility] INVASIVE CV LAB;  Service: Cardiovascular;  Laterality: N/A;   MASS EXCISION Left 05/28/2019   Procedure: EXCISION SOFT TISSUE MASS LEFT SHOULDER;  Surgeon: Eletha Boas, MD;  Location: [facility] ORS;  Service: General;  Laterality: Left;   NEPHRECTOMY Bilateral    PARATHYROIDECTOMY N/A 06/12/2016   Procedure: TOTAL PARATHYROIDECTOMY WITH AUTOTRANSPLANTATION TO LEFT FOREARM;  Surgeon: Boas Eletha, MD;  Location: [facility] OR;  Service: General;  Laterality: N/A;   PARATHYROIDECTOMY N/A 10/23/2016   Procedure:  PARATHYROIDECTOMY;  Surgeon: Eletha Boas, MD;  Location: [facility] OR;  Service: General;  Laterality: N/A;   REVERSE SHOULDER ARTHROPLASTY Right 08/24/2020   Procedure: REVERSE SHOULDER ARTHROPLASTY;  Surgeon: Cristy Bonner DASEN, MD;  Location: [facility] OR;  Service: Orthopedics;  Laterality: Right;   REVISON OF ARTERIOVENOUS FISTULA Right 07/16/2017   Procedure: REVISION OF ARTERIOVENOUS FISTULA  Right ARM;  Surgeon: Sheree Penne Bruckner, MD;  Location: [facility] OR;  Service: Vascular;  Laterality: Right;   TOTAL HIP ARTHROPLASTY Left 11/14/2020   Procedure: LEFT TOTAL HIP ARTHROPLASTY ANTERIOR APPROACH;  Surgeon: [name] Kay HERO, MD;  Location: [facility] OR;  Service: Orthopedics;  Laterality: Left;   UPPER GASTROINTESTINAL ENDOSCOPY  2021   @ [facility]   HPI:  Pt is a 63 y.o. male who presented 02/07/24 with progressive R-sided weakness and pain along with L ear pain and drainage. Pt septic with L acute otitis media. MRI brain showed no acute findings. MRI cervical spine demonstrated no interval change from prior surgery and no clear  etiology for current weakness. R weakness suspected recrudescence of prior symptoms in setting of severe infection. PMH includes: recurrent GIB, epilepsy, PRES, TIA, HTN, renal cell carcinoma, CVA, OSA, ESRD, DM II, Ischemic colitis, anterior cervical corpectomy C4 and C3-C5 fusion, PAD s/p angioplasty/stenting right SFA and popliteal arteries 12/09/2023 for critical limb ischemia.  Most recent cognitive-linguistic evaluation was completed on 12/03/15 with mild delayed recall deficits observed.   Assessment / Plan / Recommendation Clinical Impression  Pt was seen for a cognitive-linguistic evaluation and he presents with mild deficits in the areas of short-term memory and sustained/divided attention.  Pt completed the [name] Mental Status Examination (SLUMS) and scored 22/30 (N>/=27/30).   No dysarthria or aphasia were noted in conversational speech tasks. Pt reported that he lives with  his son and that his son is available to assist with IADLs including supervision with  medication and financial management at time of discharge.  Recommend brief continuation of skilled ST during inpatient stay targeting cognitive deficits.  [facility] SLUMS Examination Orientation  3/3  Numeric Problem Solving  3/3  Memory  3/5  Attention 0/2  Thought Organization 3/3  Clock Drawing 4/4  Visuospatial Skills    2/2  Short Story Recall  4/8  Total  22/30    Scoring  High School Education  Less than High School Education   Normal  27-30 25-30  Mild Neurocognitive Disorder 21-26 20-24  Dementia  1-20 1-19       SLP Assessment  SLP Recommendation/Assessment: Patient needs continued Speech Language Pathology Services SLP Visit Diagnosis: Cognitive communication deficit (R41.841)     Assistance Recommended at Discharge  Intermittent Supervision/Assistance  Functional Status Assessment Patient has had a recent decline in their functional status and demonstrates the ability to make significant improvements in function in a reasonable and predictable amount of time.  Frequency and Duration min 2x/week  2 weeks      SLP Evaluation Cognition  Overall Cognitive Status: History of cognitive impairments - at baseline Arousal/Alertness: Awake/alert Orientation Level: Oriented X4 Attention: Sustained Sustained Attention: Impaired Sustained Attention Impairment: Verbal basic Memory: Impaired Memory Impairment: Decreased short term memory;Decreased recall of new information Decreased Short Term Memory: Verbal basic Awareness: Appears intact Problem Solving: Appears intact Safety/Judgment: Appears intact       Comprehension  Auditory Comprehension Overall Auditory Comprehension: Appears within functional limits for tasks assessed    Expression Expression Primary Mode of Expression: Verbal Verbal Expression Overall Verbal Expression: Appears within functional limits for tasks  assessed Written Expression Dominant Hand: Right   Oral / Motor  Motor Speech Overall Motor Speech: Appears within functional limits for tasks assessed           Earnie Cable, M.S., CCC-SLP Acute Rehabilitation Services Office: [phone]  Earnie SQUIBB [facility] 02/11/2024, 10:54 AM

## 2024-02-11 NOTE — Consult Note (Signed)
 Reason for Consult:Weakness Referring Physician: Mennie Lamy Time called: 0813 Time at bedside: 1337   Carl Curls Sr. is an 63 y.o. male.  HPI: [name] comes in with about a 1 week hx/o weakness. This affected the right side both arm and leg. He denies any trauma. He has long hx/o joint pain in both and needs surgery on both but has been unable to get them. He notes his pain is not increased, just now weak.  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    Parathyroid  abnormality    ectopic parathyroid  gland   Presence of arteriovenous fistula for hemodialysis, primary    RUE PER PT RLE   Refusal of blood product    NO WHOLE BLOOD PROUCTS   Renal cell carcinoma (HCC)    s/p hand assisted laparoscopic bilateral nephrectomies 11/29/17, + RCC left   Secondary hyperparathyroidism    Seizures (HCC)    one episode in past, due to elevated Potassium 08/02/20- at least 4 years ago   Sleep apnea    doesn't use CPAP anymore since weight loss   Stroke [facility])    no residual    Past Surgical History:  Procedure Laterality Date   ABDOMINAL AORTOGRAM W/LOWER EXTREMITY N/A 12/09/2023   Procedure: ABDOMINAL AORTOGRAM W/LOWER EXTREMITY;  Surgeon: Sheree Penne Bruckner, MD;  Location: [facility] INVASIVE CV LAB;  Service: Cardiovascular;  Laterality: N/A;   ANTERIOR CERVICAL CORPECTOMY N/A 05/25/2023   Procedure: ANTERIOR CERVICAL CORPECTOMY CERVICAL FOUR, CERVICAL THREE -  CERVICAL FIVE FUSION;  Surgeon: [name] Shove, MD;  Location: [facility] OR;  Service: Neurosurgery;  Laterality: N/A;   AV FISTULA PLACEMENT Right    right arm   BIOPSY  08/01/2019   Procedure: BIOPSY;  Surgeon: Kristie Lamprey, MD;  Location: [facility] ENDOSCOPY;  Service: Endoscopy;;   BIOPSY  11/18/2020   Procedure: BIOPSY;  Surgeon: Stacia Glendia BRAVO, MD;  Location: [facility] ENDOSCOPY;  Service: Gastroenterology;;   BIOPSY  09/13/2021   Procedure: BIOPSY;  Surgeon: Wilhelmenia Aloha Raddle., MD;  Location: [facility] ENDOSCOPY;  Service: Gastroenterology;;   CATARACT EXTRACTION W/ INTRAOCULAR LENS  IMPLANT, BILATERAL     COLON SURGERY     COLONOSCOPY N/A 08/04/2015   Procedure: COLONOSCOPY;  Surgeon: Gordy CHRISTELLA Starch, MD;  Location: [facility] ENDOSCOPY;  Service: Endoscopy;  Laterality: N/A;   COLONOSCOPY  2017   JMP@ [facility]-good prep-mass -recall 1 yr   COLONOSCOPY N/A 09/13/2021   Procedure: COLONOSCOPY;  Surgeon: Mansouraty, Aloha Raddle., MD;  Location: [facility] ENDOSCOPY;  Service: Gastroenterology;  Laterality: N/A;   COLONOSCOPY WITH PROPOFOL  N/A 11/25/2020   Procedure: COLONOSCOPY WITH PROPOFOL ;  Surgeon: Federico Rosario BROCKS, MD;  Location: [facility] ENDOSCOPY;  Service: Gastroenterology;  Laterality: N/A;   COLONOSCOPY WITH PROPOFOL  N/A 09/23/2021   Procedure: COLONOSCOPY WITH PROPOFOL ;  Surgeon: San,  Sandor GAILS, DO;  Location: [facility] ENDOSCOPY;  Service: Gastroenterology;  Laterality: N/A;   ENTEROSCOPY N/A 09/23/2021   Procedure: ENTEROSCOPY;  Surgeon: San Sandor GAILS, DO;  Location: [facility] ENDOSCOPY;  Service: Gastroenterology;  Laterality: N/A;  Will place order for video capsule study as we may opt to place it during procedure   ESOPHAGOGASTRODUODENOSCOPY N/A 08/01/2019   Procedure: ESOPHAGOGASTRODUODENOSCOPY (EGD);  Surgeon: Kristie Lamprey, MD;  Location: [facility] ENDOSCOPY;  Service: Endoscopy;  Laterality: N/A;   ESOPHAGOGASTRODUODENOSCOPY N/A 11/18/2020   Procedure: ESOPHAGOGASTRODUODENOSCOPY (EGD);  Surgeon: Stacia Glendia BRAVO, MD;  Location: [facility]  ENDOSCOPY;  Service: Gastroenterology;  Laterality: N/A;   ESOPHAGOGASTRODUODENOSCOPY (EGD) WITH PROPOFOL  N/A 08/04/2019   Procedure: ESOPHAGOGASTRODUODENOSCOPY (EGD) WITH PROPOFOL ;  Surgeon: Leigh Elspeth SQUIBB, MD;  Location: [facility] ENDOSCOPY;  Service: Gastroenterology;  Laterality: N/A;   ESOPHAGOGASTRODUODENOSCOPY (EGD) WITH PROPOFOL  N/A 09/13/2021   Procedure: ESOPHAGOGASTRODUODENOSCOPY (EGD) WITH PROPOFOL ;  Surgeon: Wilhelmenia Aloha Raddle., MD;  Location: [facility] ENDOSCOPY;  Service: Gastroenterology;  Laterality: N/A;   GIVENS CAPSULE STUDY N/A 09/23/2021   Procedure: GIVENS CAPSULE STUDY;  Surgeon: San Sandor GAILS, DO;  Location: [facility] ENDOSCOPY;  Service: Gastroenterology;  Laterality: N/A;   graft left arm Left    for dialysis x 2. Removed   HOT HEMOSTASIS  11/18/2020   Procedure: HOT HEMOSTASIS (ARGON PLASMA COAGULATION/BICAP);  Surgeon: Stacia Glendia BRAVO, MD;  Location: [facility] ENDOSCOPY;  Service: Gastroenterology;;   HOT HEMOSTASIS N/A 09/23/2021   Procedure: HOT HEMOSTASIS (ARGON PLASMA COAGULATION/BICAP);  Surgeon: San Sandor GAILS, DO;  Location: [facility] ENDOSCOPY;  Service: Gastroenterology;  Laterality: N/A;   INSERTION OF DIALYSIS CATHETER     Rt chest   LAPAROSCOPIC RIGHT COLECTOMY N/A 08/05/2015   Procedure: LAPAROSCOPIC RIGHT COLECTOMY- ASCENDING;  Surgeon: Jina Nephew, MD;  Location: [facility] OR;  Service: General;  Laterality: N/A;   LOWER EXTREMITY ANGIOGRAPHY N/A 12/09/2023   Procedure: Lower Extremity Angiography;  Surgeon: Sheree Penne Bruckner, MD;  Location: [facility] INVASIVE CV LAB;  Service: Cardiovascular;  Laterality: N/A;   LOWER EXTREMITY INTERVENTION N/A 12/09/2023   Procedure: LOWER EXTREMITY INTERVENTION;  Surgeon: Sheree Penne Bruckner, MD;  Location: [facility] INVASIVE CV LAB;  Service: Cardiovascular;  Laterality: N/A;   MASS EXCISION Left 05/28/2019   Procedure: EXCISION SOFT TISSUE MASS LEFT SHOULDER;  Surgeon: Eletha Boas, MD;  Location: [facility] ORS;  Service: General;   Laterality: Left;   NEPHRECTOMY Bilateral    PARATHYROIDECTOMY N/A 06/12/2016   Procedure: TOTAL PARATHYROIDECTOMY WITH AUTOTRANSPLANTATION TO LEFT FOREARM;  Surgeon: Boas Eletha, MD;  Location: [facility] OR;  Service: General;  Laterality: N/A;   PARATHYROIDECTOMY N/A 10/23/2016   Procedure: PARATHYROIDECTOMY;  Surgeon: Eletha Boas, MD;  Location: [facility] OR;  Service: General;  Laterality: N/A;   REVERSE SHOULDER ARTHROPLASTY Right 08/24/2020   Procedure: REVERSE SHOULDER ARTHROPLASTY;  Surgeon: Cristy Bonner DASEN, MD;  Location: [facility] OR;  Service: Orthopedics;  Laterality: Right;   REVISON OF ARTERIOVENOUS FISTULA Right 07/16/2017   Procedure: REVISION OF ARTERIOVENOUS FISTULA  Right ARM;  Surgeon: Sheree Penne Bruckner, MD;  Location: [facility] OR;  Service: Vascular;  Laterality: Right;   TOTAL HIP ARTHROPLASTY Left 11/14/2020   Procedure: LEFT TOTAL HIP ARTHROPLASTY ANTERIOR APPROACH;  Surgeon: [name] Kay HERO, MD;  Location: [facility] OR;  Service: Orthopedics;  Laterality: Left;   UPPER GASTROINTESTINAL ENDOSCOPY  2021   @ [facility]    Family History  Problem Relation Age of Onset   Diabetes Father    Stroke Father    Hypertension Father    Uterine cancer Mother  Lupus Sister    Stroke Sister    Hypertension Sister    Anuerysm Brother        brain   Colon cancer Neg Hx    Esophageal cancer Neg Hx    Stomach cancer Neg Hx    Pancreatic cancer Neg Hx    Liver disease Neg Hx    Colon polyps Neg Hx    Rectal cancer Neg Hx     Social History:  reports that he quit smoking about 33 years ago. His smoking use included cigarettes. He has never used smokeless tobacco. He reports that he does not currently use alcohol after a past usage of about 1.0 standard drink of alcohol per week. He reports that he does not currently use drugs after having used the following drugs: Marijuana.  Allergies: Allergies[1]  Medications: I have reviewed the patient's current medications.  Results for orders placed or performed  during the hospital encounter of 02/08/24 (from the past 48 hours)  Respiratory (~20 pathogens) panel by PCR     Status: None   Collection Time: 02/09/24  2:13 PM   Specimen: Nasopharyngeal Swab; Respiratory  Result Value Ref Range   Adenovirus NOT DETECTED NOT DETECTED   Coronavirus 229E NOT DETECTED NOT DETECTED    Comment: (NOTE) The Coronavirus on the Respiratory Panel, DOES NOT test for the novel  Coronavirus (2019 nCoV)    Coronavirus HKU1 NOT DETECTED NOT DETECTED   Coronavirus NL63 NOT DETECTED NOT DETECTED   Coronavirus OC43 NOT DETECTED NOT DETECTED   Metapneumovirus NOT DETECTED NOT DETECTED   Rhinovirus / Enterovirus NOT DETECTED NOT DETECTED   Influenza A NOT DETECTED NOT DETECTED   Influenza B NOT DETECTED NOT DETECTED   Parainfluenza Virus 1 NOT DETECTED NOT DETECTED   Parainfluenza Virus 2 NOT DETECTED NOT DETECTED   Parainfluenza Virus 3 NOT DETECTED NOT DETECTED   Parainfluenza Virus 4 NOT DETECTED NOT DETECTED   Respiratory Syncytial Virus NOT DETECTED NOT DETECTED   Bordetella pertussis NOT DETECTED NOT DETECTED   Bordetella Parapertussis NOT DETECTED NOT DETECTED   Chlamydophila pneumoniae NOT DETECTED NOT DETECTED   Mycoplasma pneumoniae NOT DETECTED NOT DETECTED    Comment: Performed at [facility], [address], [place], [name]  Hemoglobin A1c     Status: None   Collection Time: 02/09/24  8:42 PM  Result Value Ref Range   Hgb A1c MFr Bld 5.1 4.8 - 5.6 %    Comment: (NOTE) Diagnosis of Diabetes The following HbA1c ranges recommended by the American Diabetes Association (ADA) may be used as an aid in the diagnosis of diabetes mellitus.  Hemoglobin             Suggested A1C NGSP%              Diagnosis  <5.7                   Non Diabetic  5.7-6.4                Pre-Diabetic  >6.4                   Diabetic  <7.0                   Glycemic control for                       adults with diabetes.     Mean Plasma Glucose 99.67  mg/dL    Comment: Performed at [facility], [address] [place], [name]  Hepatitis B surface antigen     Status: None   Collection Time: 02/10/24  6:29 AM  Result Value Ref Range   Hepatitis B Surface Ag NON REACTIVE NON REACTIVE    Comment: Performed at [facility], [address] [place], [name]  Hepatitis B surface antibody,quantitative     Status: None   Collection Time: 02/10/24  6:29 AM  Result Value Ref Range   Hep B S AB Quant (Post) 22.5 Immunity>10 mIU/mL    Comment: (NOTE)  Status of Immunity                     Anti-HBs Level  ------------------                     -------------- Inconsistent with Immunity                  0.0 - 10.0 Consistent with Immunity                         >10.0 Performed At: [facility] [address] [place], [place] [id number] Jennette Shorter MD Ey:[phone]   Renal function panel     Status: Abnormal   Collection Time: 02/10/24  6:29 AM  Result Value Ref Range   Sodium 130 (L) 135 - 145 mmol/L   Potassium 5.1 3.5 - 5.1 mmol/L   Chloride 86 (L) 98 - 111 mmol/L   CO2 25 22 - 32 mmol/L   Glucose, Bld 90 70 - 99 mg/dL    Comment: Glucose reference range applies only to samples taken after fasting for at least 8 hours.   BUN 74 (H) 8 - 23 mg/dL   Creatinine, Ser 88.99 (H) 0.61 - 1.24 mg/dL   Calcium  9.9 8.9 - 10.3 mg/dL   Phosphorus 5.9 (H) 2.5 - 4.6 mg/dL   Albumin  3.1 (L) 3.5 - 5.0 g/dL   GFR, Estimated 5 (L) >60 mL/min    Comment: (NOTE) Calculated using the CKD-EPI Creatinine Equation (2021)    Anion gap 19 (H) 5 - 15    Comment: Performed at [facility], [address], [place], [name]  CBC     Status: Abnormal   Collection Time: 02/10/24  6:29 AM  Result Value Ref Range   WBC 9.3 4.0 - 10.5 K/uL   RBC 3.36 (L) 4.22 - 5.81 MIL/uL   Hemoglobin 10.0 (L) 13.0 - 17.0 g/dL   HCT 70.2 (L) 60.9 - 47.9 %   MCV 88.4 80.0 - 100.0 fL   MCH 29.8 26.0 - 34.0 pg   MCHC 33.7  30.0 - 36.0 g/dL   RDW 84.1 (H) 88.4 - 84.4 %   Platelets 229 150 - 400 K/uL   nRBC 0.0 0.0 - 0.2 %    Comment: Performed at [facility], [address], [place], [name]  Renal function panel     Status: Abnormal   Collection Time: 02/11/24  8:53 AM  Result Value Ref Range   Sodium 134 (L) 135 - 145 mmol/L   Potassium 4.9 3.5 - 5.1 mmol/L   Chloride 90 (L) 98 - 111 mmol/L   CO2 28 22 - 32 mmol/L   Glucose, Bld 84 70 - 99 mg/dL    Comment: Glucose reference range applies only  to samples taken after fasting for at least 8 hours.   BUN 47 (H) 8 - 23 mg/dL   Creatinine, Ser 2.13 (H) 0.61 - 1.24 mg/dL   Calcium  10.1 8.9 - 10.3 mg/dL   Phosphorus 5.0 (H) 2.5 - 4.6 mg/dL   Albumin  3.1 (L) 3.5 - 5.0 g/dL   GFR, Estimated 7 (L) >60 mL/min    Comment: (NOTE) Calculated using the CKD-EPI Creatinine Equation (2021)    Anion gap 16 (H) 5 - 15    Comment: Performed at [facility], [address], [place], [name]    CT THORACIC SPINE WO CONTRAST Result Date: 02/11/2024 EXAM: CT THORACIC SPINE WITHOUT CONTRAST 02/10/2024 05:21:58 PM TECHNIQUE: CT of the thoracic spine was performed without the administration of intravenous contrast. Multiplanar reformatted images are provided for review. Automated exposure control, iterative reconstruction, and/or weight based adjustment of the mA/kV was utilized to reduce the radiation dose to as low as reasonably achievable. COMPARISON: MRI of the thoracic spine dated 02/09/2024. CLINICAL HISTORY: Compression fracture, thoracic. FINDINGS: BONES AND ALIGNMENT: Normal vertebral body heights. No acute fracture or suspicious bone lesion. There is moderate dextroscoliosis and moderate kyphosis. Erosions are present within the superior endplates of T11 and T12 anteriorly, likely secondary to degenerative disc disease. The vertebral bodies are diffusely sclerotic, presumably representing renal osteodystrophy. DEGENERATIVE CHANGES: Erosions  are present within the superior endplates of T11 and T12 anteriorly, likely secondary to degenerative disc disease. SOFT TISSUES: No acute abnormality. LUNGS: There is mild atelectasis present independently within the lungs bilaterally. IMPRESSION: 1. No acute fracture. 2. Moderate dextroscoliosis and moderate kyphosis. 3. Erosions within the superior endplates of T11 and T12 anteriorly, likely secondary to degenerative disc disease. 4. Diffuse sclerosis of the vertebral bodies, presumably representing renal osteodystrophy. 5. Mild bilateral pulmonary atelectasis. Electronically signed by: Carl Coho MD 02/11/2024 04:57 AM EST RP Workstation: [id number]   DG Knee 1-2 Views Right Result Date: 02/10/2024 CLINICAL DATA:  Pain. EXAM: RIGHT KNEE - 1-2 VIEW COMPARISON:  06/11/2023 FINDINGS: The bones are subjectively under mineralized. Medial and lateral tibiofemoral joint space narrowing. There is tricompartmental peripheral spurring. Moderate joint effusion. Quadriceps and patellar tendon enthesophytes. No erosive or bony destructive change. Vascular calcifications with vascular stent in place. IMPRESSION: Tricompartmental osteoarthritis with moderate joint effusion. Electronically Signed   By: Andrea Gasman M.D.   On: 02/10/2024 14:45   MR THORACIC SPINE WO CONTRAST Result Date: 02/09/2024 EXAM: MRI Thoracic Spine Without Intravenous Contrast 02/09/2024 06:28:29 PM TECHNIQUE: Multiplanar multisequence MRI of the thoracic spine was performed without the administration of intravenous contrast. COMPARISON: None available. CLINICAL HISTORY: Mid-back pain FINDINGS: BONES AND ALIGNMENT: Dectrocurvature. Linear edema in the left eccentric T11 and T12 superior endplates could represent degenerative change or acute/subacute fractures without significant height loss. Diffusely low signal within the bone marrow, likely related to known chronic kidney disease. No specific evidence of discitis/osteomyelitis.  Degenerative/discogenic changes can play a role and signal changes at multiple levels. SPINAL CORD: Normal spinal cord volume. Normal spinal cord signal. SOFT TISSUES: Unremarkable. DEGENERATIVE CHANGES: Multilevel facet arthropathy and dextrocurvature contributes to foraminal stenosis. This includes moderate right foraminal stenosis at T2 T3 , T8-T9, and T9-T10. Severe left foraminal stenosis at T11-T12. Significant canal stenosis . IMPRESSION: 1. Linear edema in the left eccentric T11 and T12 superior endplates could represent degenerative change or acute/subacute fractures without significant height loss. Correlate with the presence or absence of point tenderness. 2. Severe left foraminal stenosis at T11-T12. 3. Moderate  right foraminal stenosis at T2 T3 , T8-T9, and T9-T 4. S-shaped thoracolumbar curvature. 5. No significant canal stenosis. Electronically signed by: Gilmore Molt 02/09/2024 08:20 PM EST RP Workstation: [id number]   MR LUMBAR SPINE WO CONTRAST Result Date: 02/09/2024 EXAM: MRI LUMBAR SPINE 02/09/2024 06:28:15 PM TECHNIQUE: Multiplanar multisequence MRI of the lumbar spine was performed without the administration of intravenous contrast. COMPARISON: None available. CLINICAL HISTORY: Low back pain, prior surgery, new symptoms FINDINGS: BONES AND ALIGNMENT: Grade 1 anterolisthesis of L3 on L4. Rotatory levocurvature. Heterogeneous bone marrow which is low in attenuation on T1, likely related to patient's known chronic renal disease. Mild right lateral L3-L4 disc edema and surrounding marrow edema. Normal vertebral body heights. SPINAL CORD: The conus terminates normally. SOFT TISSUES: No paraspinal mass. L1-L2: No significant disc herniation. No spinal canal stenosis or neural foraminal narrowing. L2-L3: Mild disc bulge, right greater than left facet arthropathy. No significant canal or foraminal stenosis. L3-L4: Grade 1 anterolisthesis. Right lateral disc edema and degenerative disc disease  with mild associated endplate signal changes. Disc bulging with bilateral facet arthropathy. Severe right foraminal stenosis. Moderate right subarticular recess stenosis. Mild canal stenosis. L4-L5: Disc bulge, right greater than left facet arthropathy. Resulting moderate right foraminal stenosis. Patent canal and left foramen. L5-S1: Disc bulge and endplate spurring. Facet arthropathy resulting in mild left foraminal stenosis. IMPRESSION: 1. At L3-L4, moderate to severe right and mild left foraminal stenosis. Moderate right subarticular recess stenosis and mild canal stenosis. 2. At L4-L5, moderate right foraminal stenosis. 3. Right lateral L3-L4 disc edema and mild surrounding marrow edema which is most likely degenerative in the setting of rotatory levocurvature and severe degenerative changes at this level. Recommend correlation with infectious markers to exclude early discitis, which is thought less likely. Electronically signed by: Gilmore Molt 02/09/2024 08:01 PM EST RP Workstation: [id number]   MR SHOULDER RIGHT WO CONTRAST Result Date: 02/09/2024 CLINICAL DATA:  Pain after fall. 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 EXAM: MRI OF THE RIGHT SHOULDER WITHOUT CONTRAST TECHNIQUE: Multiplanar, multisequence MR imaging of the shoulder was performed. No intravenous contrast was administered. COMPARISON:  Right shoulder radiographs dated 02/07/2024. FINDINGS: Bones/Joint Status post right reverse shoulder arthroplasty with associated susceptibility artifact which significantly limits evaluation of the surrounding bone and soft tissues. No discrete fracture identified on this limited examination. Soft tissue and Muscles There is a well-circumscribed T2 intermediate to hyperintense and T1 hypointense fluid collection within the posterior subacromial/subdeltoid space measuring approximately 5.1 x 2.9 x 1.9 cm (series 6, image 5 and series 9, image 15), favored to represent a postoperative  collection such as a seroma. Severe fatty atrophy of the subscapularis, supraspinatus, and infraspinatus muscles. IMPRESSION: 1. Status post right reverse shoulder arthroplasty with associated susceptibility artifact which significantly limits evaluation of the surrounding bone and soft tissues. No discrete fracture identified on this limited examination. CT could be considered for further evaluation of the reverse shoulder arthroplasty and if there is persistent concern for acute fracture. 2. A well-circumscribed fluid collection within the posterior subacromial/subdeltoid space measuring approximately 5.1 x 2.9 x 1.9 cm is favored to represent a postoperative collection such as a seroma. 3. Severe fatty atrophy of the subscapularis, supraspinatus, and infraspinatus muscles. Electronically Signed   By: Harrietta Sherry M.D.   On: 02/09/2024 19:07   CT TEMPORAL BONES W WO CONTRAST Result Date: 02/09/2024 EXAM: CT TEMPORAL BONES WITH AND WITHOUT CONTRAST 02/09/2024 05:08:46 PM TECHNIQUE: CT of the temporal bones was performed with and without  the administration of intravenous contrast. 75 mL of iohexol  (OMNIPAQUE ) 350 MG/ML injection was administered. Multiplanar reformatted images are provided for review. Automated exposure control, iterative reconstruction, and/or weight based adjustment of the mA/kV was utilized to reduce the radiation dose to as low as reasonably achievable. COMPARISON: CT head and MRI brain 02/08/2024. CLINICAL HISTORY: Sepsis with suspected source of suppurative left acute otitis media. FINDINGS: RIGHT TEMPORAL BONE: EXTERNAL AUDITORY CANAL: The right external auditory canal appears mildly narrowed at the junction of the cartilaginous and osseous portions. There is no evidence of mass. Scutum is intact. MIDDLE EAR CAVITY: The right middle ear cavity is clear. The ossicles are normal in position and morphology without erosive change. Normal appearance of the right tympanic membrane. MASTOID  AIR CELLS: Moderate right mastoid effusion with significant opacification of mastoid air cells most pronounced posteriorly and inferiorly. There are residual aerated mastoid air cells superiorly. There is partial opacification of the mastoid antrum. Osseous septations of the mastoid air cells are intact. There is no evidence of coalescent mastoiditis. INNER EAR: The cochlea and vestibule are unremarkable. Normal mineralization of the otic capsule. Normal semicircular canals. The vestibular aqueduct is not dilated. INTERNAL AUDITORY CANAL: Unremarkable. Normal bony canal of the facial nerve. LEFT TEMPORAL BONE: EXTERNAL AUDITORY CANAL: There is additional mild soft tissue thickening along the osseous left external auditory canal. The left scutum is intact. No bony erosion. MIDDLE EAR CAVITY: There is complete opacification of the left middle ear with soft tissue fluid surrounding the ossicles. An underlying small mass cannot be excluded; however, there are no erosive changes of the ossicles appreciated. There is likely thickening of the left tympanic membrane, which is partially obscured by opacification of the left middle ear. There is no evidence of dehiscence of the tegmen tympani or tegmen mastoidium. MASTOID AIR CELLS: Large left mastoid effusion with complete opacification of the mastoid air cells. There is opacification of the mastoid antrum. The osseous septations of the mastoid air cells are intact. There is no evidence of coalescent mastoiditis. INNER EAR: The cochlea and vestibule are unremarkable. Normal mineralization of the otic capsule. Normal semicircular canals. The vestibular aqueduct is not dilated. INTERNAL AUDITORY CANAL: Unremarkable. Normal bony canal of the facial nerve. VASCULAR: Normal jugular bulbs. Normal carotid canals. There is no abnormal enhancement or abnormal soft tissue adjacent to the mastoid temporal bones. There is no evidence of Bezold abscess. BRAIN: Dense calcifications along  the visualized falx and tentorium. Unremarkable. ORBITS: No acute abnormality. SINUSES: Mild mucosal thickening in the inferior left maxillary sinus. Additional mild scattered mucosal thickening in the ethmoid sinuses. OTHER FINDINGS: Punctate calcifications in the partially visualized bilateral parotid glands. Degenerative changes of the bilateral temporomandibular joints. IMPRESSION: 1. Findings compatible with left otomastoiditis. No evidence of coalescent mastoiditis, tegmen dehiscence, or adjacent soft tissue abscess. 2. Complete opacification of the left middle ear with surrounding soft tissue/fluid about the ossicles. Small underlying mass cannot be excluded. There is no ossicular erosion. 3. Moderate right mastoid effusion without coalescent mastoiditis. Electronically signed by: Donnice Mania MD 02/09/2024 06:32 PM EST RP Workstation: [id number]    Review of Systems  HENT:  Negative for ear discharge, ear pain, hearing loss and tinnitus.   Eyes:  Negative for photophobia and pain.  Respiratory:  Negative for cough and shortness of breath.   Cardiovascular:  Negative for chest pain.  Gastrointestinal:  Negative for abdominal pain, nausea and vomiting.  Genitourinary:  Negative for dysuria, flank pain, frequency and urgency.  Musculoskeletal:  Positive for arthralgias (Right shoulder and knee). Negative for back pain, myalgias and neck pain.  Neurological:  Positive for weakness. Negative for dizziness and headaches.  Hematological:  Does not bruise/bleed easily.  Psychiatric/Behavioral:  The patient is not nervous/anxious.    Blood pressure (!) 117/53, pulse 86, temperature 98.9 F (37.2 C), resp. rate 18, height 6' 1 (1.854 m), weight 91 kg, SpO2 95%. Physical Exam Constitutional:      General: He is not in acute distress.    Appearance: He is well-developed. He is not diaphoretic.  HENT:     Head: Normocephalic and atraumatic.  Eyes:     General: No scleral icterus.       Right  eye: No discharge.        Left eye: No discharge.     Conjunctiva/sclera: Conjunctivae normal.  Cardiovascular:     Rate and Rhythm: Normal rate and regular rhythm.  Pulmonary:     Effort: Pulmonary effort is normal. No respiratory distress.  Musculoskeletal:     Cervical back: Normal range of motion.     Comments: Right shoulder, elbow, wrist, digits- no skin wounds, nontender, pain with AROM/PROM right shoulder, no instability, no blocks to motion  Sens  Ax/R/M/U intact  Mot   Ax/ R/ PIN/ M/ AIN/ U intact but weak on left  Rad 2+  RLE No traumatic wounds, ecchymosis, or rash  Mild TTP knee  Mod knee effusion  Knee stable to varus/ valgus and anterior/posterior stress  Sens DPN, SPN, TN intact  Motor EHL, ext, flex, evers 4/5, knee ext 2/5, knee flex 4/5  DP 2+, PT 2+, No significant edema  Skin:    General: Skin is warm and dry.  Neurological:     Mental Status: He is alert.  Psychiatric:        Mood and Affect: Mood normal.        Behavior: Behavior normal.     Assessment/Plan: Right shoulder pain -- He needs revision arthroplasty on this shoulder. That being said his pain seems stable. Ill-fitting implant could be contributing to weakness but wouldn't explain lower extremity symptoms so doubt this is an orthopedic problem. Right knee pain -- Needs TKA but do not see his OA as a cause for his weakness. It does not seem pain mediated. Would continue to search for causes and he can f/u with Dr. [name] upon discharge. I do not think there's anything acute to be done with his joint needs.    Ozell DOROTHA Ned, PA-C Orthopedic Surgery [phone] 02/11/2024, 1:50 PM     [1]  Allergies Allergen Reactions   Infed Bertie Dextran] Other (See Comments)    Decreased BP    Vicodin [Hydrocodone -Acetaminophen ] Other (See Comments)    Decreased BP   Hydrocodone     Diclofenac Other (See Comments)    Caused hypotension

## 2024-02-11 NOTE — Plan of Care (Signed)

## 2024-02-11 NOTE — Progress Notes (Signed)
 Physical Therapy Treatment Patient Details Name: Carl Holan Sr. MRN: [id number] DOB: [birth year] Today's Date: 02/11/2024   History of Present Illness Pt is a 63 y.o. male who presented 02/07/24 with progressive R-sided weakness and pain along with L ear pain and drainage. Pt septic with L acute otitis media. MRI brain showed no acute findings. MRI cervical spine demonstrated no interval change from prior surgery and no clear etiology for current weakness. Imaging did note prior left-sided strokes, without evidence of acute stroke or recrudescence. Neurosurgery consulted and recommends no acute intervention. R weakness suspected recrudescence of prior symptoms in setting of severe infection. PMH includes: recurrent GIB, epilepsy, PRES, TIA, HTN, renal cell carcinoma, CVA, OSA, ESRD, DM II, Ischemic colitis, anterior cervical corpectomy C4 and C3-C5 fusion (Dr. [name], 05/25/2023), PAD s/p angioplasty/stenting right SFA and popliteal arteries 12/09/2023 for critical limb ischemia    PT Comments  Pt is currently making progress towards goals. Pt is Mod A for bed mobility, 2 person Mod A for sit to stand and transfer from EOB to recliner with multi modal cues for sequencing. Due to pt current functional status, home set up and available assistance at home recommending skilled physical therapy services > 3 hours/day in order to address strength, balance and functional mobility to decrease risk for falls, injury, immobility, skin break down and re-hospitalization.      If plan is discharge home, recommend the following: Two people to help with walking and/or transfers;Two people to help with bathing/dressing/bathroom;Assistance with cooking/housework;Assist for transportation     Equipment Recommendations  BSC/3in1;Hospital bed;Hoyer lift       Precautions / Restrictions Precautions Precautions: Fall;Other (comment) Precaution/Restrictions Comments: watch HR, R knee buckles Restrictions Weight  Bearing Restrictions Per Provider Order: No     Mobility  Bed Mobility Overal bed mobility: Needs Assistance Bed Mobility: Supine to Sit     Supine to sit: Mod assist, HOB elevated     General bed mobility comments: mod A for RLE off EOB and truncal elevation    Transfers Overall transfer level: Needs assistance Equipment used: Rolling walker (2 wheels) Transfers: Sit to/from Stand, Bed to chair/wheelchair/BSC Sit to Stand: Mod assist, +2 physical assistance, +2 safety/equipment, From elevated surface   Step pivot transfers: Mod assist, +2 physical assistance, +2 safety/equipment, From elevated surface       General transfer comment: R knee blocked with STS and steps. Poor progression of LLE despite R knee block needing dense cues for taking larger amplitude steps    Ambulation/Gait     Pre-gait activities: Pt took small steps from EOB to recliner with R knee blocked throughout; slight improved with incerased time up but continued to require knee block Mod A +2 for wgt shifting, stepping and balance/LE strength. General Gait Details: unable to progress    Modified Rankin (Stroke Patients Only) Modified Rankin (Stroke Patients Only) Pre-Morbid Rankin Score: Slight disability Modified Rankin: Moderately severe disability     Balance Overall balance assessment: Needs assistance Sitting-balance support: No upper extremity supported, Feet supported Sitting balance-Leahy Scale: Fair     Standing balance support: Bilateral upper extremity supported, During functional activity, Reliant on assistive device for balance Standing balance-Leahy Scale: Poor Standing balance comment: reliant on RW and external assist of 2      Communication Communication Communication: No apparent difficulties  Cognition Arousal: Alert Behavior During Therapy: WFL for tasks assessed/performed   PT - Cognitive impairments: No apparent impairments       Following commands: Intact  Cueing Cueing Techniques: Verbal cues, Tactile cues         Pertinent Vitals/Pain Pain Assessment Pain Assessment: Faces Faces Pain Scale: Hurts even more Pain Location: R knee Pain Descriptors / Indicators: Discomfort Pain Intervention(s): Monitored during session, Limited activity within patient's tolerance    Home Living Family/patient expects to be discharged to:: Private residence Living Arrangements: Children Available Help at Discharge: Family;Available 24 hours/day Type of Home: Apartment Home Access: Level entry       Home Layout: One level Home Equipment: Rolling Walker (2 wheels);Grab bars - tub/shower;Toilet riser;Wheelchair - manual          PT Goals (current goals can now be found in the care plan section) Acute Rehab PT Goals Patient Stated Goal: to improve PT Goal Formulation: With patient Time For Goal Achievement: 02/23/24 Progress towards PT goals: Progressing toward goals    Frequency    Min 3X/week      PT Plan  Continue with current POC    AM-PAC PT 6 Clicks Mobility   Outcome Measure  Help needed turning from your back to your side while in a flat bed without using bedrails?: A Lot Help needed moving from lying on your back to sitting on the side of a flat bed without using bedrails?: A Lot Help needed moving to and from a bed to a chair (including a wheelchair)?: Total Help needed standing up from a chair using your arms (e.g., wheelchair or bedside chair)?: Total Help needed to walk in hospital room?: Total Help needed climbing 3-5 steps with a railing? : Total 6 Click Score: 8    End of Session Equipment Utilized During Treatment: Gait belt Activity Tolerance: Patient tolerated treatment well Patient left: in chair;with call bell/phone within reach;with chair alarm set Nurse Communication: Mobility status;Need for lift equipment PT Visit Diagnosis: Unsteadiness on feet (R26.81);Other abnormalities of gait and mobility  (R26.89);Muscle weakness (generalized) (M62.81);History of falling (Z91.81);Difficulty in walking, not elsewhere classified (R26.2);Repeated falls (R29.6);Other symptoms and signs involving the nervous system (R29.898)     Time: 8896-8870 PT Time Calculation (min) (ACUTE ONLY): 26 min  Charges:    $Therapeutic Activity: 8-22 mins PT General Charges $$ ACUTE PT VISIT: 1 Visit                     [name], DPT, CLT  Acute Rehabilitation Services Office: [phone] (Secure chat preferred)    [name] 02/11/2024, 1:07 PM

## 2024-02-11 NOTE — Evaluation (Signed)
 Occupational Therapy Evaluation Patient Details Name: [name]. MRN: [id number] DOB: Jun 20, 1960 Today's Date: 02/11/2024   History of Present Illness   Pt is a 63 y.o. male who presented 02/07/24 with progressive R-sided weakness and pain along with L ear pain and drainage. Pt septic with L acute otitis media. MRI brain showed no acute findings. MRI cervical spine demonstrated no interval change from prior surgery and no clear etiology for current weakness. Imaging did note prior left-sided strokes, without evidence of acute stroke or recrudescence. Neurosurgery consulted and recommends no acute intervention. R weakness suspected recrudescence of prior symptoms in setting of severe infection. PMH includes: recurrent GIB, epilepsy, PRES, TIA, HTN, renal cell carcinoma, CVA, OSA, ESRD, DM II, Ischemic colitis, anterior cervical corpectomy C4 and C3-C5 fusion (Dr. [name], 05/25/2023), PAD s/p angioplasty/stenting right SFA and popliteal arteries 12/09/2023 for critical limb ischemia     Clinical Impressions PTA, pt living with son and reports he was mod I most recently but endorses multiple falls. Upon eval, pt with R sided weakness. Pt needing mod A for bed mobility, mod A +2 for SPT transfers and up to max A for BADL this session. Pt eager to progress mobility and functional use of RUE. Due to significant decline in functional status, recommending intensive multidisciplinary rehabilitation >3 hours/day to optimize safety and independence in ADL.       If plan is discharge home, recommend the following:   Two people to help with walking and/or transfers;Two people to help with bathing/dressing/bathroom;Assistance with cooking/housework;Help with stairs or ramp for entrance;Assist for transportation     Functional Status Assessment   Patient has had a recent decline in their functional status and demonstrates the ability to make significant improvements in function in a reasonable and  predictable amount of time.     Equipment Recommendations   Other (comment) (defer)     Recommendations for Other Services   Rehab consult     Precautions/Restrictions   Precautions Precautions: Fall;Other (comment) Precaution/Restrictions Comments: watch HR, R knee buckles Restrictions Weight Bearing Restrictions Per Provider Order: No     Mobility Bed Mobility Overal bed mobility: Needs Assistance Bed Mobility: Supine to Sit     Supine to sit: Mod assist, HOB elevated     General bed mobility comments: mod A for RLE off EOB and truncal elevation    Transfers Overall transfer level: Needs assistance Equipment used: Rolling walker (2 wheels) Transfers: Sit to/from Stand, Bed to chair/wheelchair/BSC Sit to Stand: Mod assist, +2 physical assistance, +2 safety/equipment, From elevated surface     Step pivot transfers: Mod assist, +2 physical assistance, +2 safety/equipment, From elevated surface     General transfer comment: R knee blocked with STS and steps. Poor progression of LLE despite R knee block needing dense cues for taking larger amplitude steps      Balance Overall balance assessment: Needs assistance Sitting-balance support: No upper extremity supported, Feet supported Sitting balance-Leahy Scale: Fair     Standing balance support: Bilateral upper extremity supported, During functional activity, Reliant on assistive device for balance Standing balance-Leahy Scale: Poor Standing balance comment: reliant on RW and external assist of 2                           ADL either performed or assessed with clinical judgement   ADL Overall ADL's : Needs assistance/impaired Eating/Feeding: Set up;Bed level   Grooming: Contact guard assist;Sitting   Upper Body Bathing: Sitting;Minimal  assistance   Lower Body Bathing: Maximal assistance;Sit to/from stand;+2 for safety/equipment;+2 for physical assistance   Upper Body Dressing : Minimal  assistance;Sitting   Lower Body Dressing: Maximal assistance;+2 for physical assistance;+2 for safety/equipment   Toilet Transfer: Moderate assistance;+2 for physical assistance;+2 for safety/equipment;Stand-pivot;Rolling walker (2 wheels);BSC/3in1           Functional mobility during ADLs: +2 for safety/equipment;+2 for physical assistance;Moderate assistance       Vision Patient Visual Report: No change from baseline       Perception         Praxis         Pertinent Vitals/Pain Pain Assessment Pain Assessment: Faces Faces Pain Scale: Hurts even more Pain Location: R knee Pain Descriptors / Indicators: Discomfort Pain Intervention(s): Limited activity within patient's tolerance, Monitored during session     Extremity/Trunk Assessment Upper Extremity Assessment Upper Extremity Assessment: Generalized weakness;Right hand dominant;RUE deficits/detail RUE Deficits / Details: R UE functionally weaker than L. decr shoulder active flexion 0-15; PROM 0-80. somposite grasp 2+/5. some tricep activation noted with transfer using RW   Lower Extremity Assessment Lower Extremity Assessment: Defer to PT evaluation   Cervical / Trunk Assessment Cervical / Trunk Assessment: Other exceptions Cervical / Trunk Exceptions: poor core strength, sitting resting with trunk flexed anteriorly and laterally to L   Communication Communication Communication: No apparent difficulties   Cognition Arousal: Alert Behavior During Therapy: WFL for tasks assessed/performed Cognition: No apparent impairments             OT - Cognition Comments: internally distracted at times                 Following commands: Intact       Cueing  General Comments   Cueing Techniques: Verbal cues;Tactile cues      Exercises     Shoulder Instructions      Home Living Family/patient expects to be discharged to:: Private residence Living Arrangements: Children Available Help at Discharge:  Family;Available 24 hours/day Type of Home: Apartment Home Access: Level entry     Home Layout: One level     Bathroom Shower/Tub: Chief Strategy Officer: Standard (+ toilet riser) Bathroom Accessibility: Yes How Accessible: Accessible via walker Home Equipment: Rolling Walker (2 wheels);Grab bars - tub/shower;Toilet riser;Wheelchair - manual      Lives With: Son    Prior Functioning/Environment Prior Level of Function : Independent/Modified Independent             Mobility Comments: Mod I using RW; x3 falls in past month due to R knee buckling      OT Problem List: Decreased strength;Decreased activity tolerance;Impaired balance (sitting and/or standing);Decreased coordination;Decreased safety awareness;Decreased knowledge of use of DME or AE;Impaired UE functional use   OT Treatment/Interventions: Self-care/ADL training;Therapeutic exercise;DME and/or AE instruction;Therapeutic activities;Patient/family education;Balance training      OT Goals(Current goals can be found in the care plan section)   Acute Rehab OT Goals Patient Stated Goal: get better OT Goal Formulation: With patient Time For Goal Achievement: 02/25/24 Potential to Achieve Goals: Good   OT Frequency:  Min 2X/week    Co-evaluation              AM-PAC OT 6 Clicks Daily Activity     Outcome Measure Help from another person eating meals?: A Little Help from another person taking care of personal grooming?: A Little Help from another person toileting, which includes using toliet, bedpan, or urinal?: Total Help from another  person bathing (including washing, rinsing, drying)?: A Lot Help from another person to put on and taking off regular upper body clothing?: A Little Help from another person to put on and taking off regular lower body clothing?: Total 6 Click Score: 13   End of Session Equipment Utilized During Treatment: Rolling walker (2 wheels);Gait belt Nurse  Communication: Mobility status;Other (comment) (pt wanting cranberry juice but deferred to RN due to renal diet and fluid restriction)  Activity Tolerance: Patient tolerated treatment well Patient left: with call bell/phone within reach;in chair;with chair alarm set  OT Visit Diagnosis: Unsteadiness on feet (R26.81);Muscle weakness (generalized) (M62.81);History of falling (Z91.81);Pain                Time: 8896-8872 OT Time Calculation (min): 24 min Charges:  OT General Charges $OT Visit: 1 Visit OT Evaluation $OT Eval Moderate Complexity: 1 Mod  Elma JONETTA Lebron FREDERICK, OTR/L [facility] Acute Rehabilitation Office: [phone]   Elma JONETTA Lebron 02/11/2024, 1:04 PM

## 2024-02-11 NOTE — Progress Notes (Signed)
 " PROGRESS NOTE Carl Oatis Sr.  FMW:[id number] DOB: 1960-03-25 DOA: 02/08/2024 PCP: [name], MD  Brief Narrative/Hospital Course: Carl Bok Sr. is a 63 y.o. male with PMH of anterior cervical corpectomy C4 and C3-C5 fusion (Dr. [name], 05/25/2023), PAD s/p angioplasty/stenting right SFA and popliteal arteries 12/09/2023 for critical limb ischemia, ESRD, and more presenting with progressive right-sided weakness and pain x 1 wk. patient reports right leg acting funny about 1 wk w/ pain and weakness, worsening over the past several days.He also reports progressive right arm and shoulder weakness, now unable to lift the arm overhead.The left arm and leg are unaffected. Patient had multiple falls at home. also complains of left ear pain and drainage despite recent cerumen disimpaction earlier in the week and subsequent use of ear drops. Seen at [facility] 12/26 X-rays reportedly without acute findings  and was started on oral antibiotics In ED: Vitals with mild tachycardia fever, labs with mild leukocytosis potassium 5.0 influenza COVID-negative developed a fever felt most consistent with left otitis media.Primary concern was worsening neurologic deficits in the setting of prior cervical spine surgery. CT head and hip X-rays were unremarkable MRI brain>> no acute findings  MRI cervical spine >>demonstrated no interval change from prior surgery and no clear etiology for current weakness. Imaging did note prior left-sided strokes, without evidence of acute stroke or recrudescence. Neurosurgery consulted and [name] recommends no acute intervention, with plan to continue following as inpatient. Neurology and ENT was also consulted-underwent CT temporal bone with and without contrast along with MRI shoulder T-spine L-spine.   Subjective: Seen and examined in dialysis this morning Alert awake oriented Painful movement of the right shoulder right arm right knee and left knee, overall weakness  unchanged Able to move his right hand and elbow flexion well Low-grade temp last night, BP intermittently soft.  Assessment and plan:  Sepsis POA Left suppurative otitis media with suspected TM perforation: Sepsis POA likely source as otitis media, no respiratory symptoms COVID influenza negative.  RVP panel pending, blood culture NGTD ENT consulted-[name] s/p CT temporal bone w/ and wo-no evidence of coalescent mastoiditis, + left moderate mastoid effusion, right mild mastoid effusion, no tegmen dehiscence no abscess on imaging, has right EAC cerumen impaction.  Continue on Zosyn .Plan to discharge on Augmentin  to complete 10 days course total , continue Ciprodex  drops twice daily-follow-up with ENT outpatient  ESRD on HD MWF Mild hyperkalemia Metabolic bone disease: Nephro following.  Got HD 12/29.Continue home Renvela  and calcitriol  sensipar .  Monitor electrolytes  Worsening weakness on the right side with multiple falls at home S/P cervical decompression with a corpectomy at C4 by Dr. [name] in April Remote left occipital infarct Right shoulder pain and right knee pain ( see below): Postsurgery was able to walk, Now with right-sided weakness for several days  Underwent extensive evaluation and imaging and seen by neurology and neurosurgery, ENT MRI brain/MRI C-spine  MRI T, L-spine : sone T11-12-cannot rule out small endplate fracture- had CT thoracic spine w/o 12/29 -showed degenerative changes of T11 and 12, diffuse sclerosis of the vertebral bodies moderate kyphosis and moderate dextroscoliosis Has  some cervical narrowing unclear but does not appear to be significant enough to cause symptoms per neurosurgery. No evidence of cord compression-syndrome or ulcer and Dr. Luciano and indication for any neurosurgical intervention advised to continue underlying otitis media treatment and continue rehab. Per neuro If he has worsening deficits in a few weeks EMG/NCS could be considered as  outpatient. He has painful  shoulder joint elbow area right knee limiting his mobility as well-postop finding on the right shoulder and  Knee xray- OA  I have asked orthopedic [name] to eval as well Rehab MD evaluated today-and discussing w/ Neuro- suspect weakness of combination of recurdescence int the setting of his ear infection along with his orthopedic issues on Rt shoulder and knees. Cont ptot, CIR planned . Cont multimodal pain management.  Shoulder pain S/P right reverse shoulder arthroplasty 7/20022 with Dr. Cristy: Nondisplaced fracture, fluid collection Likely postop in the posterior subacromial/subdeltoid space. Severe fatty atrophy of the subscapularis supraspinatus and infraspinatus muscle, cont ptot,pain control.    Bilateral knee pain Severe left osteoarthritis Left total hip arthroplasty October/20022 Dr. [name]: Osteoarthritis of the knees followed by orthopedic as outpatient-  PAD S/p angio w/ shockwave lithotripsy,balloon angioplasty,stenting of the right SFA and popliteal 12/09/23:  Continue home aspirin , Plavix  and statin. Doppler pulses brisk and intact on Rt LE.  GERD: Continue PPI  Essential hypertension Hypotension: BP is soft he takes midodrine  predialysis started on TID 10 mg here.  T2DM w/ diabetic peripheral angiopathy without gangrene and HLD: Blood sugar controlled.  Last A1c normal in 2022.  Mobility: PT Orders: Active PT Follow up Rec: Acute Inpatient Rehab (3hours/Day)02/11/2024 1306   DVT prophylaxis: heparin  injection 5,000 Units Start: 02/09/24 0600 Code Status:   Code Status: Full Code Family Communication: plan of care discussed with patient at bedside. Patient status is: Remains hospitalized because of severity of illness Level of care: Telemetry   Dispo: The patient is from: home            Anticipated disposition: TBD Objective: Vitals last 24 hrs: Vitals:   02/11/24 0257 02/11/24 0513 02/11/24 0746 02/11/24 1226  BP: (!)  88/56 108/65 112/68 (!) 117/53  Pulse: 95 79 71 86  Resp:  17 16 18   Temp: 99.3 F (37.4 C) 98.2 F (36.8 C) 99.3 F (37.4 C) 98.9 F (37.2 C)  TempSrc: Oral     SpO2: 97% 97% 98% 95%  Weight:      Height:       Physical Examination: General exam: AAOX3, pleasant not in distress HEENT:Oral mucosa moist, Ear/Nose WNL grossly Respiratory system: CTA Bilaterally Cardiovascular system: S1 & S2 +, No JVD. Gastrointestinal system: Abdomen soft,NT,ND, BS+ Nervous System: Alert, awake, moving left side fairly ok 4+, with pain on the left knee, has pain in the right knee right shoulder ,able to flex his rt elbow, sensation intact bilaterally upper extremity lower extremities, able to dorsiflex right leg, weakness right upper extremity right lower extremity.  Extremities: extremities warm, leg edema neg Skin: Warm, no rashes MSK: Normal muscle bulk,tone, power  RUE avf+ w/ thrill  Medications reviewed:  Scheduled Meds:  (feeding supplement) PROSource Plus  30 mL Oral BID BM   aspirin  EC  81 mg Oral Daily   atorvastatin   10 mg Oral Daily   cinacalcet   120 mg Oral Q breakfast   ciprofloxacin -dexamethasone   4 drop Left EAR BID   clopidogrel   75 mg Oral Daily   gabapentin   300 mg Oral QHS   heparin   5,000 Units Subcutaneous Q8H   lidocaine   1 patch Transdermal Q24H   midodrine   10 mg Oral TID WC   multivitamin  1 tablet Oral QHS   pantoprazole   40 mg Oral BID AC   psyllium  1 packet Oral Daily   sevelamer  carbonate  3,200 mg Oral TID WC   sodium chloride  flush  3 mL  Intravenous Q12H   Continuous Infusions:  piperacillin -tazobactam (ZOSYN )  IV 2.25 g (02/11/24 0638)   Diet: Diet Order             Diet renal with fluid restriction Fluid restriction: 1200 mL Fluid; Room service appropriate? Yes; Fluid consistency: Thin  Diet effective now                  Data Reviewed: I have personally reviewed following labs and imaging studies ( see epic result tab) CBC: Recent Labs   Lab 02/08/24 1201 02/09/24 0520 02/10/24 0629  WBC 11.7* 12.1* 9.3  NEUTROABS 9.4*  --   --   HGB 11.4* 10.1* 10.0*  HCT 35.3* 30.8* 29.7*  MCV 91.7 89.5 88.4  PLT 223 216 229   CMP: Recent Labs  Lab 02/08/24 1201 02/09/24 0520 02/10/24 0629 02/11/24 0853  NA 135 134* 130* 134*  K 5.0 5.2* 5.1 4.9  CL 89* 89* 86* 90*  CO2 28 28 25 28   GLUCOSE 101* 141* 90 84  BUN 48* 61* 74* 47*  CREATININE 7.80* 9.29* 11.00* 7.86*  CALCIUM  10.8* 9.8 9.9 10.1  PHOS  --   --  5.9* 5.0*   GFR: Estimated Creatinine Clearance: 10.6 mL/min (A) (by C-G formula based on SCr of 7.86 mg/dL (H)). Recent Labs  Lab 02/10/24 0629 02/11/24 0853  ALBUMIN  3.1* 3.1*   No results for input(s): LIPASE, AMYLASE in the last 168 hours. No results for input(s): AMMONIA in the last 168 hours. Coagulation Profile: No results for input(s): INR, PROTIME in the last 168 hours. Unresulted Labs (From admission, onward)     Start     Ordered   02/11/24 0823  Renal function panel  Daily,   R      02/11/24 9177   Signed and Held  CBC  Tomorrow morning,   R        Signed and Held           Antimicrobials/Microbiology: Anti-infectives (From admission, onward)    Start     Dose/Rate Route Frequency Ordered Stop   02/09/24 0600  piperacillin -tazobactam (ZOSYN ) IVPB 2.25 g        2.25 g 100 mL/hr over 30 Minutes Intravenous Every 8 hours 02/09/24 0135     02/08/24 2200  amoxicillin -clavulanate (AUGMENTIN ) 875-125 MG per tablet 1 tablet  Status:  Discontinued        1 tablet Oral Every 12 hours 02/08/24 2104 02/09/24 0135   02/08/24 2200  piperacillin -tazobactam (ZOSYN ) IVPB 3.375 g        3.375 g 100 mL/hr over 30 Minutes Intravenous  Once 02/08/24 2153 02/08/24 2314   02/08/24 0000  amoxicillin -clavulanate (AUGMENTIN ) 875-125 MG tablet        1 tablet Oral Every 12 hours 02/08/24 1908 02/18/24 2359         Component Value Date/Time   SDES BLOOD BLOOD LEFT WRIST 02/08/2024 2239   SPECREQUEST   02/08/2024 2239    BOTTLES DRAWN AEROBIC AND ANAEROBIC Blood Culture adequate volume   CULT  02/08/2024 2239    NO GROWTH 3 DAYS Performed at [facility], [address], [place], [name]    REPTSTATUS PENDING 02/08/2024 2239  Procedures: Mennie LAMY, MD [facility] 02/11/2024, 3:36 PM   "

## 2024-02-12 ENCOUNTER — Telehealth: Payer: Self-pay

## 2024-02-12 DIAGNOSIS — A419 Sepsis, unspecified organism: Secondary | ICD-10-CM | POA: Diagnosis not present

## 2024-02-12 LAB — CBC WITH DIFFERENTIAL/PLATELET
Abs Immature Granulocytes: 0.13 K/uL — ABNORMAL HIGH (ref 0.00–0.07)
Basophils Absolute: 0.1 K/uL (ref 0.0–0.1)
Basophils Relative: 1 %
Eosinophils Absolute: 0.4 K/uL (ref 0.0–0.5)
Eosinophils Relative: 4 %
HCT: 28 % — ABNORMAL LOW (ref 39.0–52.0)
Hemoglobin: 9.3 g/dL — ABNORMAL LOW (ref 13.0–17.0)
Immature Granulocytes: 1 %
Lymphocytes Relative: 10 %
Lymphs Abs: 1 K/uL (ref 0.7–4.0)
MCH: 29.5 pg (ref 26.0–34.0)
MCHC: 33.2 g/dL (ref 30.0–36.0)
MCV: 88.9 fL (ref 80.0–100.0)
Monocytes Absolute: 1.3 K/uL — ABNORMAL HIGH (ref 0.1–1.0)
Monocytes Relative: 13 %
Neutro Abs: 7 K/uL (ref 1.7–7.7)
Neutrophils Relative %: 71 %
Platelets: 339 K/uL (ref 150–400)
RBC: 3.15 MIL/uL — ABNORMAL LOW (ref 4.22–5.81)
RDW: 15.9 % — ABNORMAL HIGH (ref 11.5–15.5)
WBC: 10 K/uL (ref 4.0–10.5)
nRBC: 0 % (ref 0.0–0.2)

## 2024-02-12 LAB — RENAL FUNCTION PANEL
Albumin: 3 g/dL — ABNORMAL LOW (ref 3.5–5.0)
Anion gap: 18 — ABNORMAL HIGH (ref 5–15)
BUN: 64 mg/dL — ABNORMAL HIGH (ref 8–23)
CO2: 26 mmol/L (ref 22–32)
Calcium: 10.1 mg/dL (ref 8.9–10.3)
Chloride: 88 mmol/L — ABNORMAL LOW (ref 98–111)
Creatinine, Ser: 9.33 mg/dL — ABNORMAL HIGH (ref 0.61–1.24)
GFR, Estimated: 6 mL/min — ABNORMAL LOW
Glucose, Bld: 103 mg/dL — ABNORMAL HIGH (ref 70–99)
Phosphorus: 5.5 mg/dL — ABNORMAL HIGH (ref 2.5–4.6)
Potassium: 4.7 mmol/L (ref 3.5–5.1)
Sodium: 132 mmol/L — ABNORMAL LOW (ref 135–145)

## 2024-02-12 NOTE — Progress Notes (Signed)
" ° °  Inpatient Rehabilitation Admissions Coordinator   I have begun Auth with [facility] Medicare for possible Cir admit.  Heron Leavell, RN, MSN Rehab Admissions Coordinator [phone] 02/12/2024 11:17 AM  "

## 2024-02-12 NOTE — Progress Notes (Signed)
 Physical Therapy Treatment Patient Details Name: Carl Danford Sr. MRN: [id number] DOB: 1961/01/21 Today's Date: 02/12/2024   History of Present Illness Pt is a 63 y.o. male who presented 02/07/24 with progressive R-sided weakness and pain along with L ear pain and drainage. Pt septic with L acute otitis media. MRI brain showed no acute findings. MRI cervical spine demonstrated no interval change from prior surgery and no clear etiology for current weakness. Imaging did note prior left-sided strokes, without evidence of acute stroke or recrudescence. Neurosurgery consulted and recommends no acute intervention. R weakness suspected recrudescence of prior symptoms in setting of severe infection. PMH includes: recurrent GIB, epilepsy, PRES, TIA, HTN, renal cell carcinoma, CVA, OSA, ESRD, DM II, Ischemic colitis, anterior cervical corpectomy C4 and C3-C5 fusion (Dr. [name], 05/25/2023), PAD s/p angioplasty/stenting right SFA and popliteal arteries 12/09/2023 for critical limb ischemia    PT Comments  Pt seen for PT tx with pt agreeable, son present for beginning of session. Pt requires encouragement to initiate mobility tasks but aware he needs to attempt them. Pt requires +2 for successful, safe sit>stand attempts from elevated EOB, unable to transfer to standing even with max assist +1. Pt is able to take ~3 side steps along EOB with RW & +2 assist but then sits. Recommend ongoing PT services to progress mobility as able. Continue to recommend post acute rehab at d/c.    If plan is discharge home, recommend the following: Two people to help with walking and/or transfers;Two people to help with bathing/dressing/bathroom;Assistance with cooking/housework;Assist for transportation   Can travel by private vehicle        Equipment Recommendations  BSC/3in1;Hospital bed;Hoyer lift    Recommendations for Other Services Rehab consult     Precautions / Restrictions Precautions Precautions: Fall;Other  (comment) Restrictions Weight Bearing Restrictions Per Provider Order: No     Mobility  Bed Mobility Overal bed mobility: Needs Assistance Bed Mobility: Supine to Sit, Rolling     Supine to sit: HOB elevated, Used rails (exit L side of bed, assistance to move RLE to EOB with son assisting, PT encouraging pt to do as much as he can for himself) Sit to supine: Mod assist, HOB elevated, Used rails (assistance to elevate BLE onto bed)   General bed mobility comments: scoots to [facility] with bed in trendelenburg position, use of bed rails    Transfers Overall transfer level: Needs assistance Equipment used: Rolling walker (2 wheels) Transfers: Sit to/from Stand Sit to Stand: Mod assist, From elevated surface, +2 safety/equipment, +2 physical assistance           General transfer comment: sit>stand from elevated EOB with mod assist +2 with PT & son assisting pt, cuing re: hand placement with fair<>poor return demo, PT blocking R knee/foot. Attempted sit>stand from EOB with +1 assist with pt demonstrating poor anterior weight shifting, ability to push to standing, unable to clear buttocks even with max assist +1.    Ambulation/Gait Ambulation/Gait assistance: Min assist, Mod assist, +2 physical assistance, +2 safety/equipment Gait Distance (Feet): 2 Feet Assistive device: Rolling walker (2 wheels)   Gait velocity: decreased     General Gait Details: Pt took side steps along EOB to R with RW, able to advance RLE with PT blocking R knee while he stepped with LLE, on 3rd stepping attempt, pt with slight R knee buckling & pt requesting to sit 2/2 fatigue. Assistance for RW management, decreased foot clearance BLE.   Stairs  Wheelchair Mobility     Tilt Bed    Modified Rankin (Stroke Patients Only)       Balance Overall balance assessment: Needs assistance Sitting-balance support: Feet supported Sitting balance-Leahy Scale: Fair     Standing balance support:  Bilateral upper extremity supported, During functional activity, Reliant on assistive device for balance Standing balance-Leahy Scale: Poor                              Communication Communication Communication: No apparent difficulties  Cognition Arousal: Alert Behavior During Therapy: WFL for tasks assessed/performed   PT - Cognitive impairments: No apparent impairments                       PT - Cognition Comments: requires extra time, encouragement to initiate tasks Following commands: Intact      Cueing Cueing Techniques: Verbal cues, Tactile cues  Exercises      General Comments        Pertinent Vitals/Pain Pain Assessment Pain Assessment: Faces Faces Pain Scale: Hurts even more Pain Location: R knee Pain Descriptors / Indicators: Discomfort, Grimacing Pain Intervention(s): Monitored during session, Limited activity within patient's tolerance, Repositioned    Home Living                          Prior Function            PT Goals (current goals can now be found in the care plan section) Acute Rehab PT Goals Patient Stated Goal: to improve PT Goal Formulation: With patient Time For Goal Achievement: 02/23/24 Potential to Achieve Goals: Fair Progress towards PT goals: Progressing toward goals    Frequency    Min 3X/week      PT Plan      Co-evaluation              AM-PAC PT 6 Clicks Mobility   Outcome Measure  Help needed turning from your back to your side while in a flat bed without using bedrails?: A Lot Help needed moving from lying on your back to sitting on the side of a flat bed without using bedrails?: A Lot Help needed moving to and from a bed to a chair (including a wheelchair)?: Total Help needed standing up from a chair using your arms (e.g., wheelchair or bedside chair)?: Total Help needed to walk in hospital room?: Total Help needed climbing 3-5 steps with a railing? : Total 6 Click Score:  8    End of Session   Activity Tolerance: Patient limited by fatigue;Patient limited by pain Patient left: in bed;with call bell/phone within reach;with bed alarm set Nurse Communication:  (provided pt with bottle of water  per pt request) PT Visit Diagnosis: Unsteadiness on feet (R26.81);Other abnormalities of gait and mobility (R26.89);Muscle weakness (generalized) (M62.81);History of falling (Z91.81);Difficulty in walking, not elsewhere classified (R26.2);Repeated falls (R29.6);Other symptoms and signs involving the nervous system (R29.898)     Time: 8771-8741 PT Time Calculation (min) (ACUTE ONLY): 30 min  Charges:    $Therapeutic Activity: 23-37 mins PT General Charges $$ ACUTE PT VISIT: 1 Visit                     [name], PT, DPT 02/12/2024, 1:07 PM   [name] 02/12/2024, 1:06 PM

## 2024-02-12 NOTE — Progress Notes (Addendum)
 " [facility] Progress Note   Subjective:   Seen in room. No CP/dyspnea. R side remains very weak. Ortho involved and CIR has been consulted for rehab needs.  Objective Vitals:   02/12/24 0113 02/12/24 0428 02/12/24 0500 02/12/24 0811  BP: 90/77 (!) 95/52  107/62  Pulse: 87 72  79  Resp:  20  16  Temp:      TempSrc:      SpO2: 93% 98%  97%  Weight:   87.6 kg   Height:       Physical Exam General: Well appearing man, NAD. Room air Heart: RRR Lungs: CTAB Abdomen: soft Extremities: no LE edema Neuro: Weakness R arm and R leg Dialysis Access: AVF  Additional Objective Labs: Basic Metabolic Panel: Recent Labs  Lab 02/10/24 0629 02/11/24 0853 02/12/24 0455  NA 130* 134* 132*  K 5.1 4.9 4.7  CL 86* 90* 88*  CO2 25 28 26   GLUCOSE 90 84 103*  BUN 74* 47* 64*  CREATININE 11.00* 7.86* 9.33*  CALCIUM  9.9 10.1 10.1  PHOS 5.9* 5.0* 5.5*   Liver Function Tests: Recent Labs  Lab 02/10/24 0629 02/11/24 0853 02/12/24 0455  ALBUMIN  3.1* 3.1* 3.0*   CBC: Recent Labs  Lab 02/08/24 1201 02/09/24 0520 02/10/24 0629  WBC 11.7* 12.1* 9.3  NEUTROABS 9.4*  --   --   HGB 11.4* 10.1* 10.0*  HCT 35.3* 30.8* 29.7*  MCV 91.7 89.5 88.4  PLT 223 216 229   Studies/Results: CT THORACIC SPINE WO CONTRAST Result Date: 02/11/2024 EXAM: CT THORACIC SPINE WITHOUT CONTRAST 02/10/2024 05:21:58 PM TECHNIQUE: CT of the thoracic spine was performed without the administration of intravenous contrast. Multiplanar reformatted images are provided for review. Automated exposure control, iterative reconstruction, and/or weight based adjustment of the mA/kV was utilized to reduce the radiation dose to as low as reasonably achievable. COMPARISON: MRI of the thoracic spine dated 02/09/2024. CLINICAL HISTORY: Compression fracture, thoracic. FINDINGS: BONES AND ALIGNMENT: Normal vertebral body heights. No acute fracture or suspicious bone lesion. There is moderate dextroscoliosis and  moderate kyphosis. Erosions are present within the superior endplates of T11 and T12 anteriorly, likely secondary to degenerative disc disease. The vertebral bodies are diffusely sclerotic, presumably representing renal osteodystrophy. DEGENERATIVE CHANGES: Erosions are present within the superior endplates of T11 and T12 anteriorly, likely secondary to degenerative disc disease. SOFT TISSUES: No acute abnormality. LUNGS: There is mild atelectasis present independently within the lungs bilaterally. IMPRESSION: 1. No acute fracture. 2. Moderate dextroscoliosis and moderate kyphosis. 3. Erosions within the superior endplates of T11 and T12 anteriorly, likely secondary to degenerative disc disease. 4. Diffuse sclerosis of the vertebral bodies, presumably representing renal osteodystrophy. 5. Mild bilateral pulmonary atelectasis. Electronically signed by: Evalene Coho MD 02/11/2024 04:57 AM EST RP Workstation: [id number]   DG Knee 1-2 Views Right Result Date: 02/10/2024 CLINICAL DATA:  Pain. EXAM: RIGHT KNEE - 1-2 VIEW COMPARISON:  06/11/2023 FINDINGS: The bones are subjectively under mineralized. Medial and lateral tibiofemoral joint space narrowing. There is tricompartmental peripheral spurring. Moderate joint effusion. Quadriceps and patellar tendon enthesophytes. No erosive or bony destructive change. Vascular calcifications with vascular stent in place. IMPRESSION: Tricompartmental osteoarthritis with moderate joint effusion. Electronically Signed   By: Andrea Gasman M.D.   On: 02/10/2024 14:45   Medications:  piperacillin -tazobactam (ZOSYN )  IV Stopped (02/12/24 9357)    (feeding supplement) PROSource Plus  30 mL Oral BID BM   aspirin  EC  81 mg Oral Daily   atorvastatin   10 mg  Oral Daily   cinacalcet   120 mg Oral Q breakfast   ciprofloxacin -dexamethasone   4 drop Left EAR BID   clopidogrel   75 mg Oral Daily   gabapentin   300 mg Oral QHS   heparin   5,000 Units Subcutaneous Q8H   lidocaine   1  patch Transdermal Q24H   midodrine   10 mg Oral TID WC   multivitamin  1 tablet Oral QHS   pantoprazole   40 mg Oral BID AC   psyllium  1 packet Oral Daily   sevelamer  carbonate  3,200 mg Oral TID WC   sodium chloride  flush  3 mL Intravenous Q12H   Dialysis Orders MWF - NW 4hr, 400/A1.5, EDW 87.3kg, 2K/2Ca bath, AVF, heparin  3000 unit bolus ESA: Mircera 50 q 2 wks (last 12/15) BMM: Calcitriol  2 mcg PO q HD, cinacalcet  120 mg daily    Assessment/Plan: R sided weakness. Hx cervical spine injury/fusion. No acute CVA on MRI, does have significant DDD but no cord compression and no surgical recs per neurosurgery note. Ortho involved, does have knee and shoulder pathology. Possibly going to CIR? Fevers/ear pain. Blood Cx negative. Mastoid effusions on MRI. On IV Zosyn . ESRD: Continue HD on usual MWF schedule - for HD today. Hyperkalemia: Does take Lokelma  on non-HD days, can give prn here. Hypotension/volume. BP chronically low end - continue midodrine  10mg  with HD. Declines blood products/albumin . Anemia of ESRD. Hgb stable - due for ESA later this week Metabolic bone disease. CorrCa high, continue sevelamer  binder + sensipar , calcitriol  on hold. Nutrition: Alb low, continue supps and renal MVI.     [name], PA-C 02/12/2024, 9:38 AM  [facility]   Seen and examined independently.  Agree with note and exam as documented above by physician extender and as noted here.  See that CIR is consulted for eval and appreciate assistance.  He states he's still not getting help with meals.   General adult male in bed in no acute distress HEENT normocephalic atraumatic extraocular movements intact sclera anicteric Neck supple trachea midline Lungs clear to auscultation bilaterally normal work of breathing at rest  Heart S1S2 no rub Abdomen soft nontender nondistended Extremities no pitting edema;  Neuro - alert and oriented, conversant and follows commands; gross right arm  weakness  Psych normal mood and affect Access RUE AVF with bruit and thrill   Right sided weakness  - disc disease noted - s/p NSGY eval and no plans for surgery at this time  - requested assistance for him with eating as he's right-handed   ESRD - HD per MWF schedule - given patient volumes and staffing, his treatment may be moved to tomorrow if needed.  I discussed this with the patient and his son at bedside    Hypotension  - on scheduled midodrine  now    Anemia of CKD - declines blood products and albumin    [name], MD 02/12/2024  12:33 PM   "

## 2024-02-12 NOTE — PMR Pre-admission (Shared)
 PMR Admission Coordinator Pre-Admission Assessment  Patient: [name]. is an 63 y.o., male MRN: [id number] DOB: [birth year] Height: 6' 1 (185.4 cm) Weight: 87.6 kg  Insurance Information HMO: yes    PPO:      PCP:      IPA:      80/20:      OTHER:  PRIMARY: [employer] Dual Complete      Policy#: [id number]; Medicare [id number]      Subscriber: pt CM Name: ***      Phone#: [phone] option 3     Fax#: [phone] Pre-Cert#: [id number]  Auth for CIR from *** with *** for admit *** with next review date ***.  Updates due to *** at fax listed above.        Employer:  Benefits:  Phone #: [phone]     Name: 12/31 Eff. Date: 03/16/23 until 02/12/24     Deduct: $257      Out of Pocket Max: $9350      Life Max: none CIR: $1565 co pay per admission      SNF: no co pay per day days 1 until 20; $209.50 co pay per day days 21 until 100 Outpatient: 80%     Co-Pay: 20% Home Health: 100%      Co-Pay: none DME: 80%     Co-Pay: 20% Providers: in network  SECONDARY: Medicaid of [place]      Policy#: [id number]     Phone#: passport one source online 02/12/24 active [employer]  Financial Counselor:       Phone#:   The Cytogeneticist Information Summary for patients in Inpatient Rehabilitation Facilities with attached Privacy Act Statement-Health Care Records was provided and verbally reviewed with: Patient and Family  Emergency Contact Information Contact Information     Name Relation Home Work Mobile   Weldon Son   [phone]   Ruven, Corradi   [phone]   [name] Daughter   [phone]      Other Contacts   None on File    Current Medical History  Patient Admitting Diagnosis:cervical myelopathy  History of Present Illness:  63 yo male with history of anterior cervical corpectomy C 4 and C3-5 fusion 05/25/23, PAD s/p angioplasty /stenting right SFA and popliteal arteries 12/09/23 for critical limb ischemia, ESRD who presented on 02/08/24 with  progressive right sided weakness and pain. Reports multiple falls over past week. Progressive right arm and shoulder weakness. Also complains of left ear pain and drainage despite recent cerumen disimpaction earlier in the week and subsequent us  od ear drops. Was seen at Urgent care 12/26 and started on oral antiobiotics.  Sepsis felt likely source as otitis media, no respiratory symptoms, COVID negative. Blood cultures negative. ENT consulted and s.p CT temporal bone without evidence of coalescent mastoiditis, + left moderate mastoid effusion, right mild mastoid effusion, no tegmen dehiscence, no abscee on imaging, has right EAC cerumen impaction. Placed on Zosyn  and plan to discharge to complete 10 days of Augmentin , continue Cipro  dex drops. Follow up with ENT as OP.  ESRD on HD MWF and continue on acute.  Underwent extensive evaluation by Neurology, Neurosurgery, orthopedic and multiple imagings. MRI brain/MRI C-spine  MRI T, L-spine : some edema T11-12-cannot rule out small endplate fracture- but CT thoracic spine w/o 12/29- DJD changes no fracture. diffuse sclerosis of the vertebral bodies moderate kyphosis and moderate dextro scoliosis. Also some cervical narrowing unclear but does not appear to be significant enough to cause symptoms per neurosurgery. No cord compression  I MRI  Per Dr. Verdell. Onetha -no indication for any neurosurgical intervention Per neuro If he has worsening deficits in a few weeks EMG/NCS could be considered as outpatient. He has painful shoulder joint, elbow area and right/left knee OA -limiting his mobility to some extent.  Knee x-ray showed osteoarthritis Seen by Ortho team-at this time no indication for surgery advise outpatient follow-up. 12/30 rehab MD evaluated and also discussed with Dr. Venessa weakness from combination of recrudescence int the setting of his ear infection along with his orthopedic issues on Rt shoulder and knees.  S/p right revers  shoulder arthroplasty 7/22. Likely needs surgery on the right shoulder as an OP. Bilateral knee pain. Severe left osteoarthritis. Left total hip arthroplasty 10/22. Continue pain management and follow up with ortho as OP.   Continue home ASA., Plavix  and statin. Doppler pulses brisk and intact on RLE for s/p stenting of the right SFA and popliteal 12/09/23.  BP soft . Takes midodrine  predialysis. Now placed TID. Heparin  sq for DVT prophylaxis.   Patient's medical record from [facility] has been reviewed by the rehabilitation admission coordinator and physician.  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    Parathyroid  abnormality    ectopic parathyroid  gland   Presence of arteriovenous fistula for hemodialysis, primary    RUE PER PT RLE   Refusal of blood product    NO WHOLE BLOOD PROUCTS   Renal cell carcinoma (HCC)    s/p hand assisted laparoscopic bilateral nephrectomies 11/29/17, + RCC left   Secondary hyperparathyroidism    Seizures (HCC)    one episode in past, due to elevated Potassium 08/02/20- at least 4 years ago   Sleep apnea    doesn't use CPAP anymore since weight loss   Stroke [facility])    no residual    Has the patient had major surgery during 100 days prior to admission? Yes  Family History   family history includes Anuerysm in his brother; Diabetes in his father; Hypertension in his father and sister; Lupus in  his sister; Stroke in his father and sister; Uterine cancer in his mother.  Current Medications Current Medications[1]  Patients Current Diet:  Diet Order             Diet renal with fluid restriction Fluid restriction: 1200 mL Fluid; Room service appropriate? Yes; Fluid consistency: Thin  Diet effective now                   Precautions / Restrictions Precautions Precautions: Fall, Other (comment) Precaution/Restrictions Comments: watch HR, R knee buckles Restrictions Weight Bearing Restrictions Per Provider Order: No   Has the patient had 2 or more falls or a fall with injury in the past year? Yes  Prior  Activity Level Limited Community (1-2x/wk): Mod I with RW  Prior Functional Level Self Care: Did the patient need help bathing, dressing, using the toilet or eating? Independent  Indoor Mobility: Did the patient need assistance with walking from room to room (with or without device)? Independent  Stairs: Did the patient need assistance with internal or external stairs (with or without device)? Independent  Functional Cognition: Did the patient need help planning regular tasks such as shopping or remembering to take medications? Independent  Patient Information Are you of Hispanic, Latino/a,or Spanish origin?: A. No, not of Hispanic, Latino/a, or Spanish origin What is your race?: B. Black or African American Do you need or want an interpreter to communicate with a doctor or health care staff?: 0. No  Patient's Response To:  Health Literacy and Transportation Is the patient able to respond to health literacy and transportation needs?: Yes Health Literacy - How often do you need to have someone help you when you read instructions, pamphlets, or other written material from your doctor or pharmacy?: Never In the past 12 months, has lack of transportation kept you from medical appointments or from getting medications?: No In the past 12 months, has lack of transportation  kept you from meetings, work, or from getting things needed for daily living?: No  Home Assistive Devices / Equipment Home Equipment: Agricultural Consultant (2 wheels), Grab bars - tub/shower, Government social research officer, Wheelchair - manual  Prior Device Use: Indicate devices/aids used by the patient prior to current illness, exacerbation or injury? Walker  Current Functional Level Cognition  Arousal/Alertness: Awake/alert Overall Cognitive Status: History of cognitive impairments - at baseline Orientation Level: Oriented X4 Attention: Sustained Sustained Attention: Impaired Sustained Attention Impairment: Verbal basic Memory: Impaired Memory Impairment: Decreased short term memory, Decreased recall of new information Decreased Short Term Memory: Verbal basic Awareness: Appears intact Problem Solving: Appears intact Safety/Judgment: Appears intact    Extremity Assessment (includes Sensation/Coordination)  Upper Extremity Assessment: Generalized weakness, Right hand dominant, RUE deficits/detail RUE Deficits / Details: R UE functionally weaker than L. decr shoulder active flexion 0-15; PROM 0-80. somposite grasp 2+/5. some tricep activation noted with transfer using RW  Lower Extremity Assessment: Defer to PT evaluation RLE Deficits / Details: </= 2/5 R hip flexion, 2+/5 R quadriceps, and 4/5 R anterior tibialis with MMT; Diminished sensation was noted throughout the R leg    ADLs  Overall ADL's : Needs assistance/impaired Eating/Feeding: Set up, Bed level Grooming: Contact guard assist, Sitting Upper Body Bathing: Sitting, Minimal assistance Lower Body Bathing: Maximal assistance, Sit to/from stand, +2 for safety/equipment, +2 for physical assistance Upper Body Dressing : Minimal assistance, Sitting Lower Body Dressing: Maximal assistance, +2 for physical assistance, +2 for safety/equipment Toilet Transfer: Moderate assistance, +2 for physical assistance, +2 for safety/equipment, Stand-pivot, Rolling  walker (2 wheels), BSC/3in1 Functional mobility during ADLs: +2 for safety/equipment, +2 for physical assistance, Moderate assistance    Mobility  Overal bed mobility: Needs Assistance Bed Mobility: Supine to Sit, Rolling Supine to sit: HOB elevated, Used rails (exit L side of bed, assistance to move RLE to EOB with son assisting, PT encouraging pt to do as much as he can for himself) Sit to supine: Mod assist, HOB elevated, Used rails (assistance to elevate BLE onto bed) General bed mobility comments: scoots to [facility] with bed in trendelenburg position, use of bed rails    Transfers  Overall transfer level: Needs assistance Equipment used: Rolling walker (2 wheels) Transfers: Sit to/from Stand Sit to Stand: Mod assist,  From elevated surface, +2 safety/equipment, +2 physical assistance Bed to/from chair/wheelchair/BSC transfer type:: Step pivot Step pivot transfers: Mod assist, +2 physical assistance, +2 safety/equipment, From elevated surface General transfer comment: sit>stand from elevated EOB with mod assist +2 with PT & son assisting pt, cuing re: hand placement with fair<>poor return demo, PT blocking R knee/foot. Attempted sit>stand from EOB with +1 assist with pt demonstrating poor anterior weight shifting, ability to push to standing, unable to clear buttocks even with max assist +1.    Ambulation / Gait / Stairs / Wheelchair Mobility  Ambulation/Gait Ambulation/Gait assistance: Min assist, Mod assist, +2 physical assistance, +2 safety/equipment Gait Distance (Feet): 2 Feet Assistive device: Rolling walker (2 wheels) Gait Pattern/deviations: Step-to pattern, Decreased step length - right, Decreased step length - left, Decreased stride length, Knees buckling, Trunk flexed General Gait Details: Pt took side steps along EOB to R with RW, able to advance RLE with PT blocking R knee while he stepped with LLE, on 3rd stepping attempt, pt with slight R knee buckling & pt requesting to sit 2/2  fatigue. Assistance for RW management, decreased foot clearance BLE. Gait velocity: decreased Gait velocity interpretation: <1.31 ft/sec, indicative of household ambulator Pre-gait activities: Pt took small steps from EOB to recliner with R knee blocked throughout; slight improved with incerased time up but continued to require knee block Mod A +2 for wgt shifting, stepping and balance/LE strength.    Posture / Balance Dynamic Sitting Balance Sitting balance - Comments: static sitting with poor core strength, flexing anteriorly and laterally to L, but no LOB, CGA for safety. MinA for dynamic sitting Balance Overall balance assessment: Needs assistance Sitting-balance support: Feet supported Sitting balance-Leahy Scale: Fair Sitting balance - Comments: static sitting with poor core strength, flexing anteriorly and laterally to L, but no LOB, CGA for safety. MinA for dynamic sitting Standing balance support: Bilateral upper extremity supported, During functional activity, Reliant on assistive device for balance Standing balance-Leahy Scale: Poor Standing balance comment: reliant on RW and external assist of 2    Special considerations/life events  HD MWF. Drives himself but son can trans prot as needed.    Previous Home Environment  Living Arrangements:  (lives with son)  Lives With: Son Available Help at Discharge: Family, Available 24 hours/day Type of Home: Apartment Home Layout: One level Home Access: Level entry Bathroom Shower/Tub: Engineer, Manufacturing Systems: Standard Bathroom Accessibility: Yes How Accessible: Accessible via walker Home Care Services: No  Discharge Living Setting Plans for Discharge Living Setting: Patient's home, Apartment, Lives with (comment) (son) Type of Home at Discharge: Apartment Discharge Home Layout: One level Discharge Home Access: Level entry Discharge Bathroom Shower/Tub: Tub/shower unit Discharge Bathroom Toilet: Standard Discharge  Bathroom Accessibility: Yes How Accessible: Accessible via walker Does the patient have any problems obtaining your medications?: No  Social/Family/Support Systems Patient Roles: Parent Contact Information: son, Marcell Anticipated Caregiver: Marcell Anticipated Caregiver's Contact Information: see contacts Ability/Limitations of Caregiver: no limitations Caregiver Availability: 24/7 Discharge Plan Discussed with Primary Caregiver: Yes Is Caregiver In Agreement with Plan?: Yes Does Caregiver/Family have Issues with Lodging/Transportation while Pt is in Rehab?: No  Goals Patient/Family Goal for Rehab: supervision PT, supervision to min OT Expected length of stay: ELOS 2 to 3 weeks Additional Information: [facility] at [facility] 4/25 for 46 days. Went to SNF afterwards and stayed only 1 to 2 weeks before returning home Pt/Family Agrees to Admission and willing to participate: Yes Program Orientation Provided & Reviewed with Pt/Caregiver Including Roles  &  Responsibilities: Yes  Decrease burden of Care through IP rehab admission: n/a  Possible need for SNF placement upon discharge: not anticipated, but went SNF 5/25 after CIR for 1-2 weeks   Patient Condition: This patient's medical and functional status has changed since the consult dated: 02/11/24 in which the Rehabilitation Physician determined and documented that the patient's condition is appropriate for intensive rehabilitative care in an inpatient rehabilitation facility. See History of Present Illness (above) for medical update. Functional changes are: ***. Patient's medical and functional status update has been discussed with the Rehabilitation physician and patient remains appropriate for inpatient rehabilitation. Will admit to inpatient rehab today.  Preadmission Screen Completed By:  Alison Heron Chrys OBIE MSN 02/12/2024 2:01 PM ______________________________________________________________________   Discussed status with Dr. PIERRETTE on  *** at *** and received approval for admission today.  Admission Coordinator:  Alison Heron Chrys, RN MSN time PIERRETTEPattricia ***   Assessment/Plan: Diagnosis: *** Does the need for close, 24 hr/day Medical supervision in concert with the patient's rehab needs make it unreasonable for this patient to be served in a less intensive setting? {yes_no_potentially:3041433} Co-Morbidities requiring supervision/potential complications: *** Due to {due un:6958565}, does the patient require 24 hr/day rehab nursing? {yes_no_potentially:3041433} Does the patient require coordinated care of a physician, rehab nurse, PT, OT, and SLP to address physical and functional deficits in the context of the above medical diagnosis(es)? {yes_no_potentially:3041433} Addressing deficits in the following areas: {deficits:3041436} Can the patient actively participate in an intensive therapy program of at least 3 hrs of therapy 5 days a week? {yes_no_potentially:3041433} The potential for patient to make measurable gains while on inpatient rehab is {potential:3041437} Anticipated functional outcomes upon discharge from inpatient rehab: {functional outcomes:304600100} PT, {functional outcomes:304600100} OT, {functional outcomes:304600100} SLP Estimated rehab length of stay to reach the above functional goals is: *** Anticipated discharge destination: {anticipated dc setting:21604} 10. Overall Rehab/Functional Prognosis: {potential:3041437}   MD Signature: ***    [1]  Current Facility-Administered Medications:    (feeding supplement) PROSource Plus liquid 30 mL, 30 mL, Oral, BID BM, Stovall, Kathryn R, PA-C, 30 mL at 02/12/24 1318   acetaminophen  (TYLENOL ) tablet 650 mg, 650 mg, Oral, Q6H PRN, 650 mg at 02/11/24 2146 **OR** acetaminophen  (TYLENOL ) suppository 650 mg, 650 mg, Rectal, Q6H PRN, Bryn Bernardino NOVAK, MD   aspirin  EC tablet 81 mg, 81 mg, Oral, Daily, Bryn Bernardino NOVAK, MD, 81 mg at 02/12/24 1106   atorvastatin  (LIPITOR)  tablet 10 mg, 10 mg, Oral, Daily, Bryn Bernardino B, MD, 10 mg at 02/12/24 1106   cinacalcet  (SENSIPAR ) tablet 120 mg, 120 mg, Oral, Q breakfast, Stovall, Kathryn R, PA-C, 120 mg at 02/12/24 0745   ciprofloxacin -dexamethasone  (CIPRODEX ) 0.3-0.1 % OTIC (EAR) suspension 4 drop, 4 drop, Left EAR, BID, Guillermina Hamilton, MD, 4 drop at 02/12/24 1116   clopidogrel  (PLAVIX ) tablet 75 mg, 75 mg, Oral, Daily, Bryn Bernardino B, MD, 75 mg at 02/12/24 1106   cyclobenzaprine  (FLEXERIL ) tablet 10 mg, 10 mg, Oral, TID PRN, Kc, Ramesh, MD, 10 mg at 02/10/24 1351   diphenhydrAMINE  (BENADRYL ) capsule 25 mg, 25 mg, Oral, Q6H PRN, Kc, Ramesh, MD   gabapentin  (NEURONTIN ) capsule 300 mg, 300 mg, Oral, QHS, Kc, Ramesh, MD, 300 mg at 02/11/24 2145   heparin  injection 5,000 Units, 5,000 Units, Subcutaneous, Q8H, Bryn Bernardino B, MD, 5,000 Units at 02/12/24 0536   lidocaine  (LIDODERM ) 5 % 1 patch, 1 patch, Transdermal, Q24H, Kc, Ramesh, MD, 1 patch at 02/12/24 1318   melatonin tablet 5 mg, 5 mg, Oral, QHS  PRN, Christobal Guadalajara, MD, 5 mg at 02/09/24 2125   midodrine  (PROAMATINE ) tablet 10 mg, 10 mg, Oral, PRN, Stovall, Kathryn R, PA-C, 10 mg at 02/12/24 0036   midodrine  (PROAMATINE ) tablet 10 mg, 10 mg, Oral, TID WC, Kc, Ramesh, MD, 10 mg at 02/12/24 1107   multivitamin (RENA-VIT) tablet 1 tablet, 1 tablet, Oral, QHS, Stovall, Kathryn R, PA-C, 1 tablet at 02/11/24 2146   pantoprazole  (PROTONIX ) EC tablet 40 mg, 40 mg, Oral, BID AC, Kc, Ramesh, MD, 40 mg at 02/12/24 0746   piperacillin -tazobactam (ZOSYN ) IVPB 2.25 g, 2.25 g, Intravenous, Q8H, Clair Lynwood CROME, RPH, Last Rate: 100 mL/hr at 02/12/24 1327, 2.25 g at 02/12/24 1327   psyllium (HYDROCIL/METAMUCIL) 1 packet, 1 packet, Oral, Daily, Guillermina Hamilton, MD, 1 packet at 02/12/24 1107   sevelamer  carbonate (RENVELA ) tablet 3,200 mg, 3,200 mg, Oral, TID WC, Stovall, Kathryn R, PA-C, 3,200 mg at 02/12/24 1319   sodium chloride  flush (NS) 0.9 % injection 3 mL, 3 mL, Intravenous, Q12H, Bryn Bernardino NOVAK, MD, 3 mL at 02/12/24 1116

## 2024-02-12 NOTE — Plan of Care (Signed)
" °  Problem: Education: Goal: Knowledge of General Education information will improve Description: Including pain rating scale, medication(s)/side effects and non-pharmacologic comfort measures 02/12/2024 1804 by Hayes Lenis, RN Outcome: Progressing 02/12/2024 1804 by Hayes Lenis, RN Outcome: Progressing   Problem: Health Behavior/Discharge Planning: Goal: Ability to manage health-related needs will improve 02/12/2024 1804 by Hayes Lenis, RN Outcome: Progressing 02/12/2024 1804 by Hayes Lenis, RN Outcome: Progressing   Problem: Clinical Measurements: Goal: Ability to maintain clinical measurements within normal limits will improve Outcome: Progressing Goal: Will remain free from infection Outcome: Progressing Goal: Diagnostic test results will improve Outcome: Progressing Goal: Respiratory complications will improve Outcome: Progressing Goal: Cardiovascular complication will be avoided Outcome: Progressing   Problem: Activity: Goal: Risk for activity intolerance will decrease Outcome: Progressing   Problem: Nutrition: Goal: Adequate nutrition will be maintained Outcome: Progressing   Problem: Coping: Goal: Level of anxiety will decrease Outcome: Progressing   Problem: Elimination: Goal: Will not experience complications related to bowel motility Outcome: Progressing Goal: Will not experience complications related to urinary retention Outcome: Progressing   Problem: Pain Managment: Goal: General experience of comfort will improve and/or be controlled Outcome: Progressing   Problem: Safety: Goal: Ability to remain free from injury will improve Outcome: Progressing   Problem: Skin Integrity: Goal: Risk for impaired skin integrity will decrease Outcome: Progressing   "

## 2024-02-12 NOTE — Plan of Care (Signed)

## 2024-02-12 NOTE — Telephone Encounter (Signed)
 [name] saw him in August.  Patient wanted to get his knee fixed first.  Carl wanted to delay that until he is recovered from his back/neck surgery.  Sounds like he is having very complicated problems and likely does need to get that shoulder and knee fixed at some time.  I think he needs to come in to see either me or Herlene first before dealing with that shoulder so we can look at some x-rays make sure he has not fractured through the tuberosity and see if he wants to get the shoulder worked on first or the knee.  Thanks

## 2024-02-12 NOTE — Telephone Encounter (Signed)
-----   Message from [name] sent at 02/12/2024  9:33 AM EST ----- Regarding: please advise -shoulder revision Patient is currently admitted. His mobile number is [phone] if Dr [name] or Herlene would want to speak directly with the patient. It looks like he might be going to [facility] after this admission.  Can you please let Dr. [name] know patient is asking about surgery?  I think the patient was also seeing Dr. [name] for the knee and was to take care of the knee first (see Dr Benjiman note below)  Patient called yesterday wanting to  know if he could schedule his shoulder surgery anytime soon,  The procedure would be--right removal implants, revision RSA, possible antibiotic spacers.  Please see a portion of the the consult note (below) from [facility] and notes from [name], [place]   From Dr. [name] 10/03/23 Carl Curls Sr. [name] is a 63 year old male with a history of spinal cord injury and dialysis who presents for evaluation of knee pain and consideration of knee replacement surgery. He experiences ongoing issues with his left knee, describing it as 'acting funny' and possibly 'bone to bone.' He previously had fluid aspirated from both knees and received a Toradol  injection for pain management. He is considering knee replacement surgery and is aware of the potential impact of his dialysis treatment and physical condition on recovery. He has a significant medical history of a spinal cord injury that resulted in paralysis and required neck surgery with plate placement. He has transitioned from a wheelchair to using a walker and occasionally a cane. He is currently undergoing physical therapy to improve mobility and strength.    [facility]: Carl Ratledge Sr. is an 63 y.o. male.  HPI: [name] comes in with about a 1 week hx/o weakness. This affected the right side both arm and leg. He denies any trauma. He has long hx/o joint pain in both and needs surgery on both but has  been unable to get them. He notes his pain is not increased, just now weak.     Carl Sharper, PA:  Assessment/Plan: Right shoulder pain -- He needs revision arthroplasty on this shoulder. That being said his pain seems stable. Ill-fitting implant could be contributing to weakness but wouldn't explain lower extremity symptoms so doubt this is an orthopedic problem. Right knee pain -- Needs TKA but do not see his OA as a cause for his weakness. It does not seem pain mediated. Would continue to search for causes and he can f/u with Dr. [name] upon discharge. I do not think there's anything acute to be done with his joint needs.

## 2024-02-12 NOTE — Progress Notes (Signed)
 " PROGRESS NOTE Beryl Balz Sr.  FMW:[id number] DOB: 1960/11/23 DOA: 02/08/2024 PCP: [name], MD  Brief Narrative/Hospital Course: Parks Czajkowski Sr. is a 63 y.o. male with PMH of anterior cervical corpectomy C4 and C3-C5 fusion (Dr. [name], 05/25/2023), PAD s/p angioplasty/stenting right SFA and popliteal arteries 12/09/2023 for critical limb ischemia, ESRD, and more presenting with progressive right-sided weakness and pain x 1 wk. patient reports right leg acting funny about 1 wk w/ pain and weakness, worsening over the past several days.He also reports progressive right arm and shoulder weakness, now unable to lift the arm overhead.The left arm and leg are unaffected. Patient had multiple falls at home. also complains of left ear pain and drainage despite recent cerumen disimpaction earlier in the week and subsequent use of ear drops. Seen at [facility] 12/26 X-rays reportedly without acute findings  and was started on oral antibiotics In ED: Vitals with mild tachycardia fever, labs with mild leukocytosis potassium 5.0 influenza COVID-negative developed a fever felt most consistent with left otitis media.Primary concern was worsening neurologic deficits in the setting of prior cervical spine surgery. CT head and hip X-rays were unremarkable MRI brain>> no acute findings  MRI cervical spine >>demonstrated no interval change from prior surgery and no clear etiology for current weakness. Imaging did note prior left-sided strokes, without evidence of acute stroke or recrudescence. Neurosurgery consulted and Dr. Colon recommends no acute intervention, with plan to continue following as inpatient. Neurology and ENT was also consulted-underwent CT temporal bone with and without contrast along with MRI shoulder T-spine L-spine. Patient was also seen by orthopedic on consultation-he was told he will need a right shoulder surgery reason and also knee replacement for his osteoarthritis. Currently working on  rehab placement  Subjective: Seen and examined  No new complaints. No new wekaness Painful rt shoulder and rt knee Son at bedside Overnight remains low-grade temp, BP intermittently soft 80s-90s Electrolytes remains stable with ESRD picture   Assessment and plan:  Sepsis POA Left suppurative otitis media with suspected TM perforation: Sepsis POA likely source as otitis media, no respiratory symptoms COVID influenza negative, RVP panel negative Blood culture NGTD. ENT [name] consulted and s/p CT temporal bone w/ and wo-no evidence of coalescent mastoiditis, + left moderate mastoid effusion, right mild mastoid effusion, no tegmen dehiscence no abscess on imaging, has right EAC cerumen impaction.  For now continue on Zosyn  and plan to discharge on Augmentin  to complete 10 days course total, continue Ciprodex  dropsbid  Follow-up with ENT outpatient  ESRD on HD MWF Mild hyperkalemia Metabolic bone disease: Nephro following.  Continue HD as per schedule.  Continue home Renvela  and calcitriol  sensipar .  Monitor electrolytes  Worsening weakness on the right side with multiple falls at home S/P cervical decompression with a corpectomy at C4 by Dr. [name] in April Remote left occipital infarct Right shoulder pain-previous arthroplasty, right knee osteoarthritis: Postsurgery was able to walk w/ walker but presents today with right-sided weakness for several days-week. Underwent extensive evaluation-seen by neurology, neurosurgery, orthopedic, and had multiple imagings as below MRI brain/MRI C-spine  MRI T, L-spine : some edema T11-12-cannot rule out small endplate fracture- but CT thoracic spine w/o 12/29- DJD cahnges no fracture. diffuse sclerosis of the vertebral bodies moderate kyphosis and moderate dextroscoliosis. Also some cervical narrowing unclear but does not appear to be significant enough to cause symptoms per neurosurgery. No cord compression I MRI  Per Dr. Verdell. Onetha -no  indication for any neurosurgical intervention Per neuro If he  has worsening deficits in a few weeks EMG/NCS could be considered as outpatient. He has painful shoulder joint, elbow area and right/left knee OA -limiting his mobility to some extent.  Knee x-ray showed osteoarthritis Seen by Ortho team-at this time no indication for surgery advise outpatient follow-up. 12/30 rehab MD evaluated and also discussed with Dr. Venessa weakness from combination of recurdescence int the setting of his ear infection along with his orthopedic issues on Rt shoulder and knees. Cont PT OT and awaiting for CIR.Cont multimodal pain management.  Shoulder pain S/P right reverse shoulder arthroplasty 7/20022 with Dr. Cristy: Nondisplaced fracture, fluid collection Likely postop in the posterior subacromial/subdeltoid space. Severe fatty atrophy of the subscapularis supraspinatus and infraspinatus muscle, cont ptot,pain control.  Likely needs surgery on the right shoulder as outpatient-orthopedic will follow-up as outpatient.  Bilateral knee pain Severe left osteoarthritis Left total hip arthroplasty October/20022 Dr. [name]: Continue multimodal pain management.follow-up with orthopedic as outpatient   PAD S/p angio w/ shockwave lithotripsy,balloon angioplasty,stenting of the right SFA and popliteal 12/09/23:  Continue home aspirin , Plavix  and statin. Doppler pulses brisk and intact on Rt LE.  GERD: Continue PPI  Essential hypertension Hypotension: BP is soft he takes midodrine  predialysis. Now placed on tid 10 mg  T2DM w/ diabetic peripheral angiopathy without gangrene and HLD: Blood sugar controlled.  Last A1c normal in 2022.  Mobility: PT Orders: Active PT Follow up Rec: Acute Inpatient Rehab (3hours/Day)02/11/2024 1306   DVT prophylaxis: heparin  injection 5,000 Units Start: 02/09/24 0600 Code Status:   Code Status: Full Code Family Communication: plan of care discussed with patient at  bedside. Patient status is: Remains hospitalized because of severity of illness Level of care: Telemetry   Dispo: The patient is from: home            Anticipated disposition: TBD Objective: Vitals last 24 hrs: Vitals:   02/12/24 0009 02/12/24 0113 02/12/24 0428 02/12/24 0500  BP:  90/77 (!) 95/52   Pulse:  87 72   Resp:   20   Temp:      TempSrc:      SpO2: 98% 93% 98%   Weight:    87.6 kg  Height:       Physical Examination: General exam: AAOX3 HEENT:Oral mucosa moist, Ear/Nose WNL grossly Respiratory system: CTA Bilaterally Cardiovascular system: S1 & S2 +, No JVD. Gastrointestinal system: Abdomen soft,NT,ND, BS+ Nervous System: AAOX3, moving left side fairly ok 4+ on LLE-right-sided shoulder pain knee pain limiting mobility and has weakness grossly on the right upper extremity lower and right lower extremity   Extremities: extremities warm, leg edema neg Skin: Warm, no rashes MSK: Normal muscle bulk,tone, power  RUE w/ AVF  Medications reviewed:  Scheduled Meds:  (feeding supplement) PROSource Plus  30 mL Oral BID BM   aspirin  EC  81 mg Oral Daily   atorvastatin   10 mg Oral Daily   cinacalcet   120 mg Oral Q breakfast   ciprofloxacin -dexamethasone   4 drop Left EAR BID   clopidogrel   75 mg Oral Daily   gabapentin   300 mg Oral QHS   heparin   5,000 Units Subcutaneous Q8H   lidocaine   1 patch Transdermal Q24H   midodrine   10 mg Oral TID WC   multivitamin  1 tablet Oral QHS   pantoprazole   40 mg Oral BID AC   psyllium  1 packet Oral Daily   sevelamer  carbonate  3,200 mg Oral TID WC   sodium chloride  flush  3 mL Intravenous Q12H  Continuous Infusions:  piperacillin -tazobactam (ZOSYN )  IV Stopped (02/12/24 9357)   Diet: Diet Order             Diet renal with fluid restriction Fluid restriction: 1200 mL Fluid; Room service appropriate? Yes; Fluid consistency: Thin  Diet effective now                  Data Reviewed: I have personally reviewed following labs  and imaging studies ( see epic result tab) CBC: Recent Labs  Lab 02/08/24 1201 02/09/24 0520 02/10/24 0629  WBC 11.7* 12.1* 9.3  NEUTROABS 9.4*  --   --   HGB 11.4* 10.1* 10.0*  HCT 35.3* 30.8* 29.7*  MCV 91.7 89.5 88.4  PLT 223 216 229   CMP: Recent Labs  Lab 02/08/24 1201 02/09/24 0520 02/10/24 0629 02/11/24 0853 02/12/24 0455  NA 135 134* 130* 134* 132*  K 5.0 5.2* 5.1 4.9 4.7  CL 89* 89* 86* 90* 88*  CO2 28 28 25 28 26   GLUCOSE 101* 141* 90 84 103*  BUN 48* 61* 74* 47* 64*  CREATININE 7.80* 9.29* 11.00* 7.86* 9.33*  CALCIUM  10.8* 9.8 9.9 10.1 10.1  PHOS  --   --  5.9* 5.0* 5.5*   GFR: Estimated Creatinine Clearance: 8.9 mL/min (A) (by C-G formula based on SCr of 9.33 mg/dL (H)). Recent Labs  Lab 02/10/24 0629 02/11/24 0853 02/12/24 0455  ALBUMIN  3.1* 3.1* 3.0*   No results for input(s): LIPASE, AMYLASE in the last 168 hours. No results for input(s): AMMONIA in the last 168 hours. Coagulation Profile: No results for input(s): INR, PROTIME in the last 168 hours. Unresulted Labs (From admission, onward)     Start     Ordered   02/11/24 0823  Renal function panel  Daily,   R      02/11/24 9177   Signed and Held  CBC  Tomorrow morning,   R        Signed and Held           Antimicrobials/Microbiology: Anti-infectives (From admission, onward)    Start     Dose/Rate Route Frequency Ordered Stop   02/09/24 0600  piperacillin -tazobactam (ZOSYN ) IVPB 2.25 g        2.25 g 100 mL/hr over 30 Minutes Intravenous Every 8 hours 02/09/24 0135     02/08/24 2200  amoxicillin -clavulanate (AUGMENTIN ) 875-125 MG per tablet 1 tablet  Status:  Discontinued        1 tablet Oral Every 12 hours 02/08/24 2104 02/09/24 0135   02/08/24 2200  piperacillin -tazobactam (ZOSYN ) IVPB 3.375 g        3.375 g 100 mL/hr over 30 Minutes Intravenous  Once 02/08/24 2153 02/08/24 2314   02/08/24 0000  amoxicillin -clavulanate (AUGMENTIN ) 875-125 MG tablet        1 tablet Oral Every  12 hours 02/08/24 1908 02/18/24 2359         Component Value Date/Time   SDES BLOOD BLOOD LEFT WRIST 02/08/2024 2239   SPECREQUEST  02/08/2024 2239    BOTTLES DRAWN AEROBIC AND ANAEROBIC Blood Culture adequate volume   CULT  02/08/2024 2239    NO GROWTH 4 DAYS Performed at [facility], [address], [place], [name]    REPTSTATUS PENDING 02/08/2024 2239  Procedures: Mennie LAMY, MD [facility] 02/12/2024, 11:38 AM   "

## 2024-02-13 DIAGNOSIS — A419 Sepsis, unspecified organism: Secondary | ICD-10-CM | POA: Diagnosis not present

## 2024-02-13 LAB — CULTURE, BLOOD (ROUTINE X 2)
Culture: NO GROWTH
Culture: NO GROWTH
Special Requests: ADEQUATE

## 2024-02-13 LAB — CBC
HCT: 29.8 % — ABNORMAL LOW (ref 39.0–52.0)
Hemoglobin: 10 g/dL — ABNORMAL LOW (ref 13.0–17.0)
MCH: 29.4 pg (ref 26.0–34.0)
MCHC: 33.6 g/dL (ref 30.0–36.0)
MCV: 87.6 fL (ref 80.0–100.0)
Platelets: 353 K/uL (ref 150–400)
RBC: 3.4 MIL/uL — ABNORMAL LOW (ref 4.22–5.81)
RDW: 15.6 % — ABNORMAL HIGH (ref 11.5–15.5)
WBC: 9.1 K/uL (ref 4.0–10.5)
nRBC: 0 % (ref 0.0–0.2)

## 2024-02-13 LAB — RENAL FUNCTION PANEL
Albumin: 3.1 g/dL — ABNORMAL LOW (ref 3.5–5.0)
Anion gap: 16 — ABNORMAL HIGH (ref 5–15)
BUN: 37 mg/dL — ABNORMAL HIGH (ref 8–23)
CO2: 27 mmol/L (ref 22–32)
Calcium: 9.8 mg/dL (ref 8.9–10.3)
Chloride: 91 mmol/L — ABNORMAL LOW (ref 98–111)
Creatinine, Ser: 5.9 mg/dL — ABNORMAL HIGH (ref 0.61–1.24)
GFR, Estimated: 10 mL/min — ABNORMAL LOW
Glucose, Bld: 93 mg/dL (ref 70–99)
Phosphorus: 3.5 mg/dL (ref 2.5–4.6)
Potassium: 4.3 mmol/L (ref 3.5–5.1)
Sodium: 133 mmol/L — ABNORMAL LOW (ref 135–145)

## 2024-02-13 MED ORDER — OXYCODONE-ACETAMINOPHEN 5-325 MG PO TABS
1.0000 | ORAL_TABLET | ORAL | Status: DC | PRN
  Administered 2024-02-13 – 2024-03-03 (×49): 1 via ORAL
  Filled 2024-02-13 (×49): qty 1

## 2024-02-13 MED ORDER — SEVELAMER CARBONATE 800 MG PO TABS
2400.0000 mg | ORAL_TABLET | Freq: Three times a day (TID) | ORAL | Status: DC
  Administered 2024-02-13 – 2024-03-03 (×41): 2400 mg via ORAL
  Filled 2024-02-13 (×44): qty 3

## 2024-02-13 MED ORDER — CHLORHEXIDINE GLUCONATE CLOTH 2 % EX PADS
6.0000 | MEDICATED_PAD | Freq: Every day | CUTANEOUS | Status: DC
  Administered 2024-02-13 – 2024-02-17 (×5): 6 via TOPICAL

## 2024-02-13 NOTE — Progress Notes (Signed)
" °   02/13/24 0330  Vitals  Temp 98.2 F (36.8 C)  Temp Source Oral  BP 136/77  MAP (mmHg) 93  BP Location Right Arm  BP Method Automatic  Patient Position (if appropriate) Lying  Pulse Rate 89  Pulse Rate Source Monitor  Oxygen Therapy  SpO2 97 %  O2 Device Room Air  During Treatment Monitoring  Blood Flow Rate (mL/min) 370 mL/min  Arterial Pressure (mmHg) -170 mmHg  Venous Pressure (mmHg) 240 mmHg  TMP (mmHg) 11 mmHg  Ultrafiltration Rate (mL/min) 1020 mL/min  Dialysate Flow Rate (mL/min) 300 ml/min  Dialysate Potassium Concentration 2  Dialysate Calcium  Concentration 2.5  Cumulative Fluid Removed (mL) per Treatment  2000  HD Safety Checks Performed Yes  Intra-Hemodialysis Comments Tx completed  Post Treatment  Dialyzer Clearance Lightly streaked  Liters Processed 76.2  Fluid Removed (mL) 2000 mL  Tolerated HD Treatment Yes  AVG/AVF Arterial Site Held (minutes) 10 minutes  AVG/AVF Venous Site Held (minutes) 10 minutes    "

## 2024-02-13 NOTE — Progress Notes (Signed)
" °  Progress Note   Patient: Carl Almquist Sr. FMW:[id number] DOB: [birth year] DOA: 02/08/2024     2 DOS: the patient was seen and examined on 02/13/2024 at 10:10AM      Brief hospital course: 64 y.o. M with ESRD on HD MWF, PVD s/p angioplasty R SFA Oct 2025 for ischemic rest pain as well as C4 spinal fracture in Apr 2025 with incomplete spinal cord injury requiring C4 corpectomy, fusion and interbody cage by [name], wheelchair bound for a time, but progressed by August to walking with a cane and driving(?) who presented with right arm and leg weakness, falls, as well as posterior neck pain, fever, and ear drainage.  Admitted on antibiotics. Neurosurgery, Neurology, Orthopedics, ENT, and Nephrology consulted.  Ultimately diagnosed with otitis media and ruptured ear drum.  Right sided weakness believed to be recrudescence of old spinal cord injury symptoms in setting of infection.     Assessment and Plan: Sepsis due to postoperative otitis media with suspected TM perforation See summary from Dr. CHRISTOBAL from 12/31 - Continue Zosyn  day 5 of 10 - Continue Ciprodex  drops - Needs ENT follow up with [name] on or around Jan 5-12   Right-sided weakness History of cervical decompression/corpectomy C4 by [name] April 2025 Remote left occipital infarct Spinal MR shows no new cord injury.  Neurosurgery and Neurology involved, feel that this is recrudescence of old symptoms due to infection. - PT OT - Outpatient Neurosurgery follow up recommended   Right shoulder pain, history of right reverse shoulder arthroplasty Right knee osteoarthritis - Follow up with [facility]  Peripheral vascular disease status post angioplasty October 2025 -Continue aspirin , Lipitor, Plavix   Chronic hypotension -Continue midodrine   Diabetes Glucose controlled off medication - Continue gabapentin   Hyponatremia Mild stable  Anemia of chronic kidney disease Mild stable         Subjective: Patient  has shoulder and knee pain, these are his chief complaints.  He is quite weak on the right side.  Unable to stand without assistance.     Physical Exam: BP (!) 89/56 (BP Location: Left Arm)   Pulse 79   Temp 98.2 F (36.8 C) (Oral)   Resp 18   Ht 6' 1 (1.854 m)   Wt 87.6 kg   SpO2 100%   BMI 25.48 kg/m   Elderly adult male, appears older than stated age, lying in bed, sits up with difficulty RRR, no peripheral edema Respiratory rate normal, lung sounds clear, no rales or wheezes Abdomen soft, no tenderness palpation or guarding Diffuse loss of subcutaneous muscle mass and fat, worse on the right side Right-sided weakness noted, oriented x 3, face symmetric, speech fluent    Data Reviewed: Basic metabolic panel shows hyponatremia, elevated creatinine and BUN CBC shows stable anemia    Family Communication:     Disposition: Status is: Inpatient 64 yo M with recent spinal injury, but had a remarkably good improvement but returned with falls and weakness, found to have sepsis from suppurative otitis media, and recrudescence of old weakness.  Significantly debilitated and require extensive rehabilitation return to his prior level of function        Author: Lonni SHAUNNA Dalton, MD 02/13/2024 4:59 PM  For on call review [email or web address].    "

## 2024-02-13 NOTE — Progress Notes (Signed)
 Physical Therapy Treatment Patient Details Name: Carl Ciszewski Sr. MRN: [id number] DOB: 27-Jun-1960 Today's Date: 02/13/2024   History of Present Illness Pt is a 64 y.o. male who presented 02/07/24 with progressive R-sided weakness and pain along with L ear pain and drainage. Pt septic with L acute otitis media. MRI brain showed no acute findings. MRI cervical spine demonstrated no interval change from prior surgery and no clear etiology for current weakness. Imaging did note prior left-sided strokes, without evidence of acute stroke or recrudescence. Neurosurgery consulted and recommends no acute intervention. R weakness suspected recrudescence of prior symptoms in setting of severe infection. PMH includes: recurrent GIB, epilepsy, PRES, TIA, HTN, renal cell carcinoma, CVA, OSA, ESRD, DM II, Ischemic colitis, anterior cervical corpectomy C4 and C3-C5 fusion (Dr. [name], 05/25/2023), PAD s/p angioplasty/stenting right SFA and popliteal arteries 12/09/2023 for critical limb ischemia    PT Comments  Pt received in bed, c/o continued R knee pain. Discussed with pt the need for mobility and strengthening of RLE to be able to mobilize despite the discomfort he is having. Pt relays continued concern over R sided weakness and questions why he could ambulate and drive a week ago and now cannot move his R side. Pt able to come to EOB with increased time and LUE assisting RLE. Uses momentum to scoot R hip fwd to EOB. Noted decreased core stability but pt without full LOB with scooting in sitting. Pregait training performed EOB. Pt needed max A +2 for sit>stand with first attempt yielding only squat position with knees, hips, trunk flexed. Pt able to come to full standing with subsequent reps but unable to step feet due to R knee buckling and decreased ability to accept wt RUE to offset RLE. Pt performed squat pivot to L to recliner with max A +2. Patient will benefit from intensive inpatient follow-up therapy, >3  hours/day. PT will continue to follow.      If plan is discharge home, recommend the following: Two people to help with walking and/or transfers;Two people to help with bathing/dressing/bathroom;Assistance with cooking/housework;Assist for transportation   Can travel by private vehicle        Equipment Recommendations  BSC/3in1;Hospital bed;Hoyer lift    Recommendations for Other Services Rehab consult     Precautions / Restrictions Precautions Precautions: Fall;Other (comment) Precaution/Restrictions Comments: watch HR, R knee buckles Restrictions Weight Bearing Restrictions Per Provider Order: No     Mobility  Bed Mobility Overal bed mobility: Needs Assistance Bed Mobility: Supine to Sit     Supine to sit: HOB elevated Sit to supine:  (assistance to elevate BLE onto bed)   General bed mobility comments: pt able to use LUE to assist RLE to EOB, needed increased time but able to get to EOB without physical assist.    Transfers Overall transfer level: Needs assistance Equipment used: Rolling walker (2 wheels) Transfers: Sit to/from Stand, Bed to chair/wheelchair/BSC Sit to Stand: From elevated surface, +2 safety/equipment, +2 physical assistance, Max assist     Squat pivot transfers: Max assist, +2 physical assistance, +2 safety/equipment, From elevated surface     General transfer comment: pt has difficulty extending through knees/ hips. trunk. First STS pt maintained squat position. Second time pt able to come to full standing but R knee buckling and pt with limited ability with RUE to be able to counter the buckling. After pt was unsuccessful with stepping performed squat pivot with max A +2 to left. Lift pad for maximove in chair for return to  bed    Ambulation/Gait         Gait velocity: decreased     General Gait Details: pt unable to step feet in standing today due to knee buckle   Stairs             Wheelchair Mobility     Tilt Bed     Modified Rankin (Stroke Patients Only) Modified Rankin (Stroke Patients Only) Pre-Morbid Rankin Score: Slight disability Modified Rankin: Moderately severe disability     Balance Overall balance assessment: Needs assistance Sitting-balance support: Feet supported Sitting balance-Leahy Scale: Fair Sitting balance - Comments: pt with posterior pelvic tilt in sitting with decreased trunk control noted.   Standing balance support: Bilateral upper extremity supported, During functional activity, Reliant on assistive device for balance Standing balance-Leahy Scale: Zero Standing balance comment: max A +2 and UE support needed to maintain standing                            Communication Communication Communication: No apparent difficulties  Cognition Arousal: Alert Behavior During Therapy: WFL for tasks assessed/performed   PT - Cognitive impairments: No apparent impairments                       PT - Cognition Comments: requires extra time, encouragement to initiate tasks Following commands: Intact      Cueing Cueing Techniques: Verbal cues, Tactile cues  Exercises General Exercises - Lower Extremity Quad Sets: AROM, Right, 20 reps, Seated Long Arc Quad: AAROM, Right, 5 reps, Seated    General Comments General comments (skin integrity, edema, etc.): VSS.      Pertinent Vitals/Pain Pain Assessment Pain Assessment: Faces Faces Pain Scale: Hurts whole lot Pain Location: R knee Pain Descriptors / Indicators: Discomfort, Grimacing Pain Intervention(s): Limited activity within patient's tolerance, Monitored during session    Home Living                          Prior Function            PT Goals (current goals can now be found in the care plan section) Acute Rehab PT Goals Patient Stated Goal: to improve PT Goal Formulation: With patient Time For Goal Achievement: 02/23/24 Potential to Achieve Goals: Fair Progress towards PT goals:  Not progressing toward goals - comment (R sided weakness)    Frequency    Min 3X/week      PT Plan      Co-evaluation              AM-PAC PT 6 Clicks Mobility   Outcome Measure  Help needed turning from your back to your side while in a flat bed without using bedrails?: A Little Help needed moving from lying on your back to sitting on the side of a flat bed without using bedrails?: A Little Help needed moving to and from a bed to a chair (including a wheelchair)?: Total Help needed standing up from a chair using your arms (e.g., wheelchair or bedside chair)?: Total Help needed to walk in hospital room?: Total Help needed climbing 3-5 steps with a railing? : Total 6 Click Score: 10    End of Session Equipment Utilized During Treatment: Gait belt Activity Tolerance: Patient limited by fatigue;Patient limited by pain Patient left: with call bell/phone within reach;with chair alarm set;in chair Nurse Communication: Need for lift equipment (maximove (maxisky in room but  not working properly)) PT Visit Diagnosis: Unsteadiness on feet (R26.81);Other abnormalities of gait and mobility (R26.89);Muscle weakness (generalized) (M62.81);History of falling (Z91.81);Difficulty in walking, not elsewhere classified (R26.2);Repeated falls (R29.6);Other symptoms and signs involving the nervous system (R29.898)     Time: 8966-8898 PT Time Calculation (min) (ACUTE ONLY): 28 min  Charges:    $Gait Training: 8-22 mins $Therapeutic Activity: 8-22 mins PT General Charges $$ ACUTE PT VISIT: 1 Visit                     Richerd Lipoma, PT  Acute Rehab Services Secure chat preferred Office [phone]    Richerd CROME [place] 02/13/2024, 11:25 AM

## 2024-02-13 NOTE — Plan of Care (Signed)
   Problem: Clinical Measurements: Goal: Ability to maintain clinical measurements within normal limits will improve Outcome: Progressing Goal: Will remain free from infection Outcome: Progressing Goal: Diagnostic test results will improve Outcome: Progressing Goal: Respiratory complications will improve Outcome: Progressing Goal: Cardiovascular complication will be avoided Outcome: Progressing   Problem: Safety: Goal: Ability to remain free from injury will improve Outcome: Progressing

## 2024-02-13 NOTE — Plan of Care (Signed)
   Problem: Clinical Measurements: Goal: Ability to maintain clinical measurements within normal limits will improve Outcome: Progressing

## 2024-02-13 NOTE — Progress Notes (Addendum)
 " [facility] Progress Note   Subjective:   Reports he is tired from dialysis this morning. Otherwise no concerns. Denies SOB, CP , dizziness.   Objective Vitals:   02/13/24 0303 02/13/24 0330 02/13/24 0352 02/13/24 0736  BP: 122/80 136/77 108/67 92/61  Pulse: 82 89 91 95  Resp:   16 18  Temp:  98.2 F (36.8 C)    TempSrc:  Oral    SpO2: 97% 97% 98% 93%  Weight:      Height:       Physical Exam General: alert male in NAD Heart: RRR, no murmur Lungs: CTA bilaterally Abdomen: soft, non-distended Extremities: no edema b/l lower extremities Dialysis Access:  AVF+ bruit  Additional Objective Labs: Basic Metabolic Panel: Recent Labs  Lab 02/11/24 0853 02/12/24 0455 02/13/24 0746  NA 134* 132* 133*  K 4.9 4.7 4.3  CL 90* 88* 91*  CO2 28 26 27   GLUCOSE 84 103* 93  BUN 47* 64* 37*  CREATININE 7.86* 9.33* 5.90*  CALCIUM  10.1 10.1 9.8  PHOS 5.0* 5.5* 3.5   Liver Function Tests: Recent Labs  Lab 02/11/24 0853 02/12/24 0455 02/13/24 0746  ALBUMIN  3.1* 3.0* 3.1*   No results for input(s): LIPASE, AMYLASE in the last 168 hours. CBC: Recent Labs  Lab 02/08/24 1201 02/09/24 0520 02/10/24 0629 02/12/24 1844 02/13/24 0746  WBC 11.7* 12.1* 9.3 10.0 9.1  NEUTROABS 9.4*  --   --  7.0  --   HGB 11.4* 10.1* 10.0* 9.3* 10.0*  HCT 35.3* 30.8* 29.7* 28.0* 29.8*  MCV 91.7 89.5 88.4 88.9 87.6  PLT 223 216 229 339 353   Blood Culture    Component Value Date/Time   SDES BLOOD BLOOD LEFT WRIST 02/08/2024 2239   SPECREQUEST  02/08/2024 2239    BOTTLES DRAWN AEROBIC AND ANAEROBIC Blood Culture adequate volume   CULT  02/08/2024 2239    NO GROWTH 5 DAYS Performed at [facility], [address], [place], [name]    REPTSTATUS 02/13/2024 FINAL 02/08/2024 2239    Cardiac Enzymes: No results for input(s): CKTOTAL, CKMB, CKMBINDEX, TROPONINI in the last 168 hours. CBG: No results for input(s): GLUCAP in the last 168  hours. Iron Studies: No results for input(s): IRON, TIBC, TRANSFERRIN, FERRITIN in the last 72 hours. @lablastinr3 @ Studies/Results: No results found. Medications:  piperacillin -tazobactam (ZOSYN )  IV 2.25 g (02/13/24 0609)    (feeding supplement) PROSource Plus  30 mL Oral BID BM   aspirin  EC  81 mg Oral Daily   atorvastatin   10 mg Oral Daily   cinacalcet   120 mg Oral Q breakfast   ciprofloxacin -dexamethasone   4 drop Left EAR BID   clopidogrel   75 mg Oral Daily   gabapentin   300 mg Oral QHS   heparin   5,000 Units Subcutaneous Q8H   lidocaine   1 patch Transdermal Q24H   midodrine   10 mg Oral TID WC   multivitamin  1 tablet Oral QHS   pantoprazole   40 mg Oral BID AC   psyllium  1 packet Oral Daily   sevelamer  carbonate  2,400 mg Oral TID WC   sodium chloride  flush  3 mL Intravenous Q12H    Dialysis Orders: MWF - NW 4hr, 400/A1.5, EDW 87.3kg, 2K/2Ca bath, AVF, heparin  3000 unit bolus ESA: Mircera 50 q 2 wks (last 12/15) BMM: Calcitriol  2 mcg PO q HD, cinacalcet  120 mg daily   Assessment/Plan: R sided weakness. Hx cervical spine injury/fusion. No acute CVA on MRI, does have  significant DDD but no cord compression and no surgical recs per neurosurgery note. Ortho involved, does have knee and shoulder pathology. Possibly going to CIR? Fevers/ear pain. Blood Cx negative. Mastoid effusions on MRI. On IV Zosyn . ESRD: Continue HD on usual MWF schedule, had HD early this AM, next HD tomorrow.  Hyperkalemia: Does take Lokelma  on non-HD days, can give prn here. Hypotension/volume. BP chronically low end - continue midodrine  10mg  with HD. Declines blood products/albumin . Anemia of ESRD. Hgb stable - due for ESA later this week Metabolic bone disease. CorrCa high, continue sevelamer  binder + sensipar , calcitriol  on hold. Nutrition: Alb low, continue supps and renal MVI.   Carl Collet, PA-C 02/13/2024, 9:55 AM  [facility] Pager: [phone]   Seen and  examined independently.  Agree with note and exam as documented above by physician extender and as noted here.  Appreciate nursing - helped them reposition him in bed  General adult male in bed in no acute distress HEENT normocephalic atraumatic extraocular movements intact sclera anicteric Neck supple trachea midline Lungs clear to auscultation bilaterally normal work of breathing at rest  Heart S1S2 no rub Abdomen soft nontender nondistended Extremities no pitting edema;  Neuro - alert and oriented, conversant and follows commands; gross right arm weakness  Psych normal mood and affect Access RUE AVF with bruit and thrill    Right sided weakness  - disc disease noted - s/p NSGY eval and no plans for surgery at this time  - requested assistance for him with eating as he's right-handed - goal for CIR per charting   ESRD - HD per MWF schedule   Hypotension  - on scheduled midodrine  now    Anemia of CKD - declines blood products and albumin    [name], MD 02/13/2024  3:31 PM   "

## 2024-02-14 DIAGNOSIS — A419 Sepsis, unspecified organism: Secondary | ICD-10-CM | POA: Diagnosis not present

## 2024-02-14 LAB — RENAL FUNCTION PANEL
Albumin: 3.1 g/dL — ABNORMAL LOW (ref 3.5–5.0)
Anion gap: 16 — ABNORMAL HIGH (ref 5–15)
BUN: 53 mg/dL — ABNORMAL HIGH (ref 8–23)
CO2: 29 mmol/L (ref 22–32)
Calcium: 9.6 mg/dL (ref 8.9–10.3)
Chloride: 90 mmol/L — ABNORMAL LOW (ref 98–111)
Creatinine, Ser: 7.76 mg/dL — ABNORMAL HIGH (ref 0.61–1.24)
GFR, Estimated: 7 mL/min — ABNORMAL LOW
Glucose, Bld: 101 mg/dL — ABNORMAL HIGH (ref 70–99)
Phosphorus: 5.1 mg/dL — ABNORMAL HIGH (ref 2.5–4.6)
Potassium: 4.3 mmol/L (ref 3.5–5.1)
Sodium: 134 mmol/L — ABNORMAL LOW (ref 135–145)

## 2024-02-14 MED ORDER — DARBEPOETIN ALFA 40 MCG/0.4ML IJ SOSY
40.0000 ug | PREFILLED_SYRINGE | INTRAMUSCULAR | Status: DC
  Administered 2024-02-14 – 2024-02-21 (×2): 40 ug via SUBCUTANEOUS
  Filled 2024-02-14 (×3): qty 0.4

## 2024-02-14 MED ORDER — LIDOCAINE 5 % EX PTCH
1.0000 | MEDICATED_PATCH | CUTANEOUS | Status: AC
  Administered 2024-02-14: 1 via TRANSDERMAL
  Filled 2024-02-14: qty 1

## 2024-02-14 NOTE — Progress Notes (Signed)
 Physical Therapy Treatment Patient Details Name: Carl Rotundo Sr. MRN: [id number] DOB: [birth year] Today's Date: 02/14/2024   History of Present Illness Pt is a 64 y.o. male who presented 02/07/24 with progressive R-sided weakness and pain along with L ear pain and drainage. Pt septic with L acute otitis media. MRI brain showed no acute findings. MRI cervical spine demonstrated no interval change from prior surgery and no clear etiology for current weakness. Imaging did note prior left-sided strokes, without evidence of acute stroke or recrudescence. Neurosurgery consulted and recommends no acute intervention. R weakness suspected recrudescence of prior symptoms in setting of severe infection. PMH includes: recurrent GIB, epilepsy, PRES, TIA, HTN, renal cell carcinoma, CVA, OSA, ESRD, DM II, Ischemic colitis, anterior cervical corpectomy C4 and C3-C5 fusion (Dr. [name], 05/25/2023), PAD s/p angioplasty/stenting right SFA and popliteal arteries 12/09/2023 for critical limb ischemia    PT Comments  Pt received in supine and agreeable to session. Pt continues to report R knee pain and weakness with edema noted. Pt able to stand x2 with mod A +2 progressing to max A +2 and take lateral steps towards [facility] with difficulty clearing L foot. Pt demonstrates R knee instability, but no buckling noted. Pt also limited by R shoulder pain and weakness. Pt motivated to participate and progress mobility. Pt continues to benefit from PT services to progress toward functional mobility goals.    If plan is discharge home, recommend the following: Two people to help with walking and/or transfers;Two people to help with bathing/dressing/bathroom;Assistance with cooking/housework;Assist for transportation   Can travel by private vehicle        Equipment Recommendations  BSC/3in1;Hospital bed;Hoyer lift    Recommendations for Other Services       Precautions / Restrictions Precautions Precautions: Fall;Other  (comment) Precaution/Restrictions Comments: watch HR, R knee buckles Restrictions Weight Bearing Restrictions Per Provider Order: No     Mobility  Bed Mobility Overal bed mobility: Needs Assistance Bed Mobility: Supine to Sit, Sit to Supine     Supine to sit: Min assist Sit to supine: Min assist   General bed mobility comments: assist needed to manage RLE during transitions    Transfers Overall transfer level: Needs assistance Equipment used: Rolling walker (2 wheels) Transfers: Sit to/from Stand Sit to Stand: Mod assist, Max assist, +2 physical assistance, +2 safety/equipment, From elevated surface           General transfer comment: pt required MOD- MAX A +2 to stand x2 from elevated EOB. pt with decreased ability to fully extend hips and elevate trunk into standing, blocked RLE during the first stand with no indication of buckling and on the second attempt, pt did not require RLE to be blocked. pt able to complete lateral weight shifts with MIN A +2 as precursor to gait tasks. pt even able to take lateral steps to [facility] with MIN A +2 to manage RW. multimodat cues for upright posture. Pt unable to clear L foot, but able to pivot to advance    Ambulation/Gait                   Stairs             Wheelchair Mobility     Tilt Bed    Modified Rankin (Stroke Patients Only) Modified Rankin (Stroke Patients Only) Pre-Morbid Rankin Score: Slight disability Modified Rankin: Moderately severe disability     Balance Overall balance assessment: Needs assistance Sitting-balance support: Feet supported Sitting balance-Leahy Scale: Fair Sitting balance -  Comments: CGA sitting EOB   Standing balance support: Bilateral upper extremity supported, During functional activity, Reliant on assistive device for balance Standing balance-Leahy Scale: Poor Standing balance comment: reliant on RW and external support                            Communication  Communication Communication: No apparent difficulties  Cognition Arousal: Alert Behavior During Therapy: WFL for tasks assessed/performed   PT - Cognitive impairments: No apparent impairments                         Following commands: Intact      Cueing Cueing Techniques: Verbal cues  Exercises General Exercises - Lower Extremity Long Arc Quad: AROM, Seated, 10 reps, Both (RLE limited ROM)    General Comments        Pertinent Vitals/Pain Pain Assessment Pain Assessment: Faces Faces Pain Scale: Hurts even more Pain Location: R knee Pain Descriptors / Indicators: Discomfort, Grimacing Pain Intervention(s): Limited activity within patient's tolerance, Monitored during session, Repositioned     PT Goals (current goals can now be found in the care plan section) Acute Rehab PT Goals Patient Stated Goal: to improve PT Goal Formulation: With patient Time For Goal Achievement: 02/23/24 Progress towards PT goals: Progressing toward goals    Frequency    Min 3X/week           Co-evaluation PT/OT/SLP Co-Evaluation/Treatment: Yes Reason for Co-Treatment: For patient/therapist safety;To address functional/ADL transfers PT goals addressed during session: Balance;Mobility/safety with mobility;Proper use of DME;Strengthening/ROM OT goals addressed during session: ADL's and self-care      AM-PAC PT 6 Clicks Mobility   Outcome Measure  Help needed turning from your back to your side while in a flat bed without using bedrails?: A Little Help needed moving from lying on your back to sitting on the side of a flat bed without using bedrails?: A Little Help needed moving to and from a bed to a chair (including a wheelchair)?: A Lot Help needed standing up from a chair using your arms (e.g., wheelchair or bedside chair)?: A Lot Help needed to walk in hospital room?: Total Help needed climbing 3-5 steps with a railing? : Total 6 Click Score: 12    End of Session  Equipment Utilized During Treatment: Gait belt Activity Tolerance: Patient limited by fatigue;Patient limited by pain Patient left: with call bell/phone within reach;in bed;with bed alarm set;Other (comment) (with dietician) Nurse Communication: Mobility status PT Visit Diagnosis: Unsteadiness on feet (R26.81);Other abnormalities of gait and mobility (R26.89);Muscle weakness (generalized) (M62.81);History of falling (Z91.81);Difficulty in walking, not elsewhere classified (R26.2);Repeated falls (R29.6);Other symptoms and signs involving the nervous system (R29.898)     Time: 8560-8497 PT Time Calculation (min) (ACUTE ONLY): 23 min  Charges:    $Therapeutic Activity: 8-22 mins PT General Charges $$ ACUTE PT VISIT: 1 Visit                    Darryle George, PTA Acute Rehabilitation Services Secure Chat Preferred  [phone]    Darryle George 02/14/2024, 3:50 PM

## 2024-02-14 NOTE — Progress Notes (Signed)
" °   02/14/24 1119  Vitals  Temp (!) 97.3 F (36.3 C)  Temp Source Oral  BP 96/60  MAP (mmHg) 71  BP Location Left Arm  BP Method Automatic  Patient Position (if appropriate) Lying  Pulse Rate 76  Pulse Rate Source Monitor  ECG Heart Rate 76  Resp 16  Weight 84.2 kg  Type of Weight Post-Dialysis  Oxygen Therapy  SpO2 93 %  O2 Device Room Air  Pulse Oximetry Type Continuous  During Treatment Monitoring  Blood Flow Rate (mL/min) 400 mL/min  Arterial Pressure (mmHg) -204.43 mmHg  Venous Pressure (mmHg) 260.79 mmHg  TMP (mmHg) -1.01 mmHg  Ultrafiltration Rate (mL/min) 780 mL/min  Dialysate Flow Rate (mL/min) 299 ml/min  Duration of HD Treatment -hour(s) 3.42 hour(s)  Cumulative Fluid Removed (mL) per Treatment  1940.4  HD Safety Checks Performed Yes  Intra-Hemodialysis Comments Tolerated well;Tx completed  Post Treatment  Liters Processed 84  Fluid Removed (mL) 2000 mL  Tolerated HD Treatment Yes  AVG/AVF Arterial Site Held (minutes) 8 minutes  AVG/AVF Venous Site Held (minutes) 8 minutes    "

## 2024-02-14 NOTE — Progress Notes (Signed)
 Initial Nutrition Assessment  DOCUMENTATION CODES:  Not applicable  INTERVENTION:  [employer] 1.4 PO BID. Each supplement provides 455 Kcals and 20 grams protein. Double protein allowance for each meal. Continue 1 packet ProSource Plus BID. Each supplement provides 100 Kcals and 15 g protein. Continue renal diet with 1200 mL fluid restriction. Continue renal multivitamin PO once daily.  NUTRITION DIAGNOSIS:  Increased nutrient needs related to chronic illness as evidenced by estimated needs   GOAL:  Patient will meet greater than or equal to 90% of their needs   MONITOR:  PO intake, Supplement acceptance, Labs, Weight trends, I & O's  REASON FOR ASSESSMENT:  Consult Assessment of nutrition requirement/status  ASSESSMENT:  Patient presented with extremity weakness, falls, neck pain, fever and ear drainage and was found to have sepsis secondary to postop otitis media and tympanic membrane perforation. 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  Visited the patient who states that he dislikes the food at the hospital. He is working on a fish dinner currently and c/o the fish being dry. He is drinking the ProSource and likes it. He would like an ONS but states Nepro gives him diarrhea because he is lactose-intolerant. He is willing to try [employer]. He states his UBW is 85-86 Kg dry. He tells me he was able to walk until he fell just before admission. He denies intake issues PTA.  Typical day's intake: Breakfast - [employer] sausage McMuffin and coffee on the way to HD. Lunch - eats half of chicken hibachi Dinner - eats other half of chicken hibachi  Scheduled Meds:  (feeding supplement) PROSource Plus  30 mL Oral BID BM   aspirin  EC  81 mg Oral Daily   atorvastatin    10 mg Oral Daily   Chlorhexidine  Gluconate Cloth  6 each Topical Q0600   cinacalcet   120 mg Oral Q breakfast   ciprofloxacin -dexamethasone   4 drop Left EAR BID   clopidogrel   75 mg Oral Daily   darbepoetin (ARANESP ) injection - DIALYSIS  40 mcg Subcutaneous Q Fri-1800   gabapentin   300 mg Oral QHS   heparin   5,000 Units Subcutaneous Q8H   lidocaine   1 patch Transdermal Q24H   midodrine   10 mg Oral TID WC   multivitamin  1 tablet Oral QHS   pantoprazole   40 mg Oral BID AC   psyllium  1 packet Oral Daily   sevelamer  carbonate  2,400 mg Oral TID WC   sodium chloride  flush  3 mL Intravenous Q12H   Continuous Infusions:  piperacillin -tazobactam (ZOSYN )  IV 2.25 g (02/14/24 1529)   PRN Meds:.acetaminophen  **OR** acetaminophen , cyclobenzaprine , diphenhydrAMINE , melatonin, midodrine , oxyCODONE -acetaminophen   Diet Order             Diet renal with fluid restriction Fluid restriction: 1200 mL Fluid; Room service appropriate? Yes; Fluid consistency: Thin  Diet effective now                  Meal Intake: 100% charted although patient reports he dislikes the food here  Labs:     Latest Ref Rng & Units 02/14/2024    1:38 AM 02/13/2024    7:46 AM 02/12/2024    4:55 AM  CMP  Glucose 70 - 99 mg/dL 898  93  896   BUN 8 - 23 mg/dL 53  37  64   Creatinine 0.61 - 1.24 mg/dL 2.23  4.09  0.66   Sodium 135 - 145 mmol/L 134  133  132   Potassium 3.5 - 5.1 mmol/L 4.3  4.3  4.7   Chloride 98 - 111 mmol/L 90  91  88   CO2 22 - 32 mmol/L 29  27  26    Calcium  8.9 - 10.3 mg/dL 9.6  9.8  89.8     I/O: -4 L since admit  NUTRITION - FOCUSED PHYSICAL EXAM: Flowsheet Row Most Recent Value  Orbital Region No depletion  Upper Arm Region No depletion  Thoracic and Lumbar Region No depletion  Buccal Region No depletion  Temple Region Severe depletion  Clavicle Bone Region Moderate depletion  Clavicle and Acromion Bone Region Moderate depletion  Scapular Bone Region Moderate depletion  Dorsal Hand  Moderate depletion  Patellar Region Unable to assess  [patient with long pants and edema in one knee]  Anterior Thigh Region Unable to assess  [patient with long pants]  Posterior Calf Region Severe depletion  Edema (RD Assessment) None  Hair Reviewed  Eyes Reviewed  Mouth Reviewed  Skin Reviewed  Nails Reviewed    EDUCATION NEEDS:  Education needs have been addressed  Skin:  Skin Assessment: Reviewed RN Assessment  Last BM:  1/1  Height:  Ht Readings from Last 1 Encounters:  02/09/24 6' 1 (1.854 m)   Weight:  Wt Readings from Last 10 Encounters:  02/14/24 84.2 kg  12/09/23 87.6 kg  11/13/23 87.1 kg  08/19/23 83.5 kg  07/15/23 87.8 kg  05/30/23 86.5 kg  04/24/23 86.2 kg  11/23/22 86.8 kg  01/16/22 92.6 kg  11/28/21 91.8 kg   Weight Change: 3 Kg (3.5%) loss in 2 months - not clinically significant  Usual Body Weight: 85-86 Kg dry weight per patient  Edema: none  Ideal Body Weight:  83.6 kg   BMI:  Body mass index is 24.49 kg/m.  Estimated Daily Nutritional Needs:  Kcal:  2100-2400 Protein:  125-150 g Fluid:  1.2 L fluid restriction noted     Leverne Ruth, MS, RDN, LDN [place]. [facility] See AMION for contact information Secure chat preferred

## 2024-02-14 NOTE — Progress Notes (Signed)
" °  Progress Note   Patient: Carl Swopes Sr. FMW:[id number] DOB: 27-Jul-1960 DOA: 02/08/2024     3 DOS: the patient was seen and examined on 02/14/2024 at 11:30 AM      Brief hospital course: 64 y.o. M with ESRD on HD MWF, PVD s/p angioplasty R SFA Oct 2025 for ischemic rest pain as well as C4 spinal fracture in Apr 2025 with incomplete spinal cord injury requiring C4 corpectomy, fusion and interbody cage by [name], wheelchair bound for a time, but progressed by August to walking with a cane and driving(?) who presented with right arm and leg weakness, falls, as well as posterior neck pain, fever, and ear drainage.  Admitted on antibiotics. Neurosurgery, Neurology, Orthopedics, ENT, and Nephrology consulted.  Ultimately diagnosed with otitis media and ruptured ear drum.  Right sided weakness believed to be recrudescence of old spinal cord injury symptoms in setting of infection.     Assessment and Plan: Sepsis due to postoperative otitis media with suspected TM perforation Patient was admitted with sepsis present on admission due to suspected suppurative otitis media.  COVID, flu, RVP negative.  Blood cultures no growth.  ENT [name] was consulted, and patient underwent CT of the temporal bones without evidence of coalescent mastoiditis, bone breakdown, or abscess. - Continue Zosyn , day 6 of 10, can transition to Augmentin  at discharge - Continue Ciprodex  drops - Needs ENT follow up with [name] on or around Jan 5-12   Right-sided weakness History of cervical decompression/corpectomy C4 by [name] April 2025 Remote left occipital infarct Admitted and given concern for recurrent spinal cord injury, neurosurgery and neurology were consulted.  Spinal MR shows no new cord injury.  Neurosurgery and Neurology involved, feel that this is recrudescence of old symptoms due to infection. - PT OT - Outpatient Neurosurgery follow up recommended   Right shoulder pain, history of right  reverse shoulder arthroplasty Right knee osteoarthritis - Follow up with [facility]  Peripheral vascular disease status post angioplasty October 2025 -Continue aspirin , Lipitor, Plavix   Chronic hypotension -Continue midodrine   Diabetes Glucose controlled off medication - Continue gabapentin   Hyponatremia Mild stable  Anemia of chronic kidney disease Mild stable         Subjective: No clinical change today, nursing of no new concerns     Physical Exam: BP (!) 71/55 (BP Location: Left Arm)   Pulse 89   Temp 98.9 F (37.2 C)   Resp 17   Ht 6' 1 (1.854 m)   Wt 84.2 kg   SpO2 98%   BMI 24.49 kg/m   Elderly adult male, appears older than stated age, lying in bed, sits up with difficulty RRR, no peripheral edema Respiratory rate normal, lung sounds clear, no rales or wheezes Abdomen soft, no tenderness palpation or guarding Diffuse loss of subcutaneous muscle mass and fat, worse on the right side Right-sided weakness noted, oriented x 3, face symmetric, speech fluent        Disposition: Status is: Inpatient 64 yo M with recent spinal injury, but had a remarkably good improvement but returned with falls and weakness, found to have sepsis from suppurative otitis media, and recrudescence of old weakness.  Significantly debilitated and require extensive rehabilitation return to his prior level of function        Author: Lonni SHAUNNA Dalton, MD 02/14/2024 7:14 PM  For on call review [email or web address].    "

## 2024-02-14 NOTE — Progress Notes (Signed)
" ° °  Inpatient Rehabilitation Admissions Coordinator   I await insurance approval for possible CIR admit.  Heron Leavell, RN, MSN Rehab Admissions Coordinator [phone] 02/14/2024 10:24 AM  "

## 2024-02-14 NOTE — Progress Notes (Addendum)
 " [facility] Progress Note   Subjective:   Seen on HD. Reports feeling well but R leg still weak. Denies SOB, CP, dizziness.   Objective Vitals:   02/14/24 0500 02/14/24 0536 02/14/24 0728 02/14/24 0747  BP:  104/67 (!) 88/75 (!) 86/73  Pulse:  78  78  Resp:  18  15  Temp:  98.2 F (36.8 C) 98.3 F (36.8 C)   TempSrc:      SpO2:  97%  98%  Weight: 86.5 kg     Height:       Physical Exam General: alert male in NAD Heart: RRR, no murmur Lungs: CTA bilaterally Abdomen: soft, non-distended Extremities: no edema b/l lower extremities Dialysis Access:  AVF+ bruit  Additional Objective Labs: Basic Metabolic Panel: Recent Labs  Lab 02/12/24 0455 02/13/24 0746 02/14/24 0138  NA 132* 133* 134*  K 4.7 4.3 4.3  CL 88* 91* 90*  CO2 26 27 29   GLUCOSE 103* 93 101*  BUN 64* 37* 53*  CREATININE 9.33* 5.90* 7.76*  CALCIUM  10.1 9.8 9.6  PHOS 5.5* 3.5 5.1*   Liver Function Tests: Recent Labs  Lab 02/12/24 0455 02/13/24 0746 02/14/24 0138  ALBUMIN  3.0* 3.1* 3.1*   No results for input(s): LIPASE, AMYLASE in the last 168 hours. CBC: Recent Labs  Lab 02/08/24 1201 02/09/24 0520 02/10/24 0629 02/12/24 1844 02/13/24 0746  WBC 11.7* 12.1* 9.3 10.0 9.1  NEUTROABS 9.4*  --   --  7.0  --   HGB 11.4* 10.1* 10.0* 9.3* 10.0*  HCT 35.3* 30.8* 29.7* 28.0* 29.8*  MCV 91.7 89.5 88.4 88.9 87.6  PLT 223 216 229 339 353   Blood Culture    Component Value Date/Time   SDES BLOOD BLOOD LEFT WRIST 02/08/2024 2239   SPECREQUEST  02/08/2024 2239    BOTTLES DRAWN AEROBIC AND ANAEROBIC Blood Culture adequate volume   CULT  02/08/2024 2239    NO GROWTH 5 DAYS Performed at [facility], [address] [place], [name]    REPTSTATUS 02/13/2024 FINAL 02/08/2024 2239    Cardiac Enzymes: No results for input(s): CKTOTAL, CKMB, CKMBINDEX, TROPONINI in the last 168 hours. CBG: No results for input(s): GLUCAP in the last 168 hours. Iron  Studies: No results for input(s): IRON, TIBC, TRANSFERRIN, FERRITIN in the last 72 hours. @lablastinr3 @ Studies/Results: No results found. Medications:  piperacillin -tazobactam (ZOSYN )  IV 2.25 g (02/14/24 0531)    (feeding supplement) PROSource Plus  30 mL Oral BID BM   aspirin  EC  81 mg Oral Daily   atorvastatin   10 mg Oral Daily   Chlorhexidine  Gluconate Cloth  6 each Topical Q0600   cinacalcet   120 mg Oral Q breakfast   ciprofloxacin -dexamethasone   4 drop Left EAR BID   clopidogrel   75 mg Oral Daily   gabapentin   300 mg Oral QHS   heparin   5,000 Units Subcutaneous Q8H   lidocaine   1 patch Transdermal Q24H   midodrine   10 mg Oral TID WC   multivitamin  1 tablet Oral QHS   pantoprazole   40 mg Oral BID AC   psyllium  1 packet Oral Daily   sevelamer  carbonate  2,400 mg Oral TID WC   sodium chloride  flush  3 mL Intravenous Q12H    Dialysis Orders: MWF - NW 4hr, 400/A1.5, EDW 87.3kg, 2K/2Ca bath, AVF, heparin  3000 unit bolus ESA: Mircera 50 q 2 wks (last 12/15) BMM: Calcitriol  2 mcg PO q HD, cinacalcet  120 mg daily  Assessment/Plan: R sided weakness. Hx cervical spine injury/fusion. No acute CVA on MRI, does have significant DDD but no cord compression and no surgical recs per neurosurgery note. Ortho involved, does have knee and shoulder pathology. Possibly going to CIR. Fevers/ear pain. Blood Cx negative. Mastoid effusions on MRI. On IV Zosyn . ESRD: Continue HD on usual MWF schedule Hyperkalemia: Does take Lokelma  on non-HD days, K+ controlled here on renal diet.  Hypotension/volume. BP chronically low end - continue midodrine  10mg  with HD. Declines blood products/albumin . Anemia of ESRD. Hgb stable - due for ESA , ordered.  Metabolic bone disease. CorrCa high, continue sevelamer  binder + sensipar , calcitriol  on hold. Nutrition: Alb low, continue supps and renal MVI.   Lucie Collet, PA-C 02/14/2024, 8:17 AM  [facility] Pager: [phone]    Seen and examined independently.  Agree with note and exam as documented above by physician extender and as noted here.  Seen and examined on dialysis.  Procedure supervised.  Blood pressure 116/58 and HR 71.  Tolerating goal.  Right AVF in use.   Hoping for CIR  [name], MD 02/14/2024  10:49 AM  "

## 2024-02-14 NOTE — Progress Notes (Signed)
 Occupational Therapy Treatment Patient Details Name: Carl Velaquez Sr. MRN: [id number] DOB: 11/17/1960 Today's Date: 02/14/2024   History of present illness Pt is a 64 y.o. male who presented 02/07/24 with progressive R-sided weakness and pain along with L ear pain and drainage. Pt septic with L acute otitis media. MRI brain showed no acute findings. MRI cervical spine demonstrated no interval change from prior surgery and no clear etiology for current weakness. Imaging did note prior left-sided strokes, without evidence of acute stroke or recrudescence. Neurosurgery consulted and recommends no acute intervention. R weakness suspected recrudescence of prior symptoms in setting of severe infection. PMH includes: recurrent GIB, epilepsy, PRES, TIA, HTN, renal cell carcinoma, CVA, OSA, ESRD, DM II, Ischemic colitis, anterior cervical corpectomy C4 and C3-C5 fusion (Dr. [name], 05/25/2023), PAD s/p angioplasty/stenting right SFA and popliteal arteries 12/09/2023 for critical limb ischemia   OT comments  Pt seen in conjunction with PT to maximize pts activity tolerance and optimize pt participation. Pt continues to present with R knee pain and RUE weakness. Pt currently requires Min A for bed mobility and MOD- MAX A +2 for sit>stands from EOB. Pt able to stand with no buckling this session and even take side steps to [facility] with MIN A +2. Patient will benefit from continued inpatient follow up therapy, <3 hours/day         If plan is discharge home, recommend the following:  Two people to help with walking and/or transfers;Two people to help with bathing/dressing/bathroom;Assistance with cooking/housework;Help with stairs or ramp for entrance;Assist for transportation   Equipment Recommendations  Other (comment) (defer)    Recommendations for Other Services      Precautions / Restrictions Precautions Precautions: Fall;Other (comment) Precaution/Restrictions Comments: watch HR, R knee  buckles Restrictions Weight Bearing Restrictions Per Provider Order: No       Mobility Bed Mobility Overal bed mobility: Needs Assistance Bed Mobility: Supine to Sit, Sit to Supine     Supine to sit: Min assist Sit to supine: Min assist   General bed mobility comments: assist needed to manage RLE during transitions    Transfers Overall transfer level: Needs assistance Equipment used: Rolling walker (2 wheels) Transfers: Sit to/from Stand Sit to Stand: Mod assist, Max assist, +2 physical assistance, +2 safety/equipment, From elevated surface           General transfer comment: pt required MOD- MAX A +2 to stand x2 from elevated EOB. pt with decreased ability to fully extend hips and elevate trunk into standing, blocked RLE during the first stand with no indication of buckling and on the second attempt, pt did not require RLE to be blocked. pt able to complete lateral weight shifts with MIN A +2 as precursor to gait tasks. pt even able to take lateral steps to [facility] with MIN A +2 to manage RW.     Balance Overall balance assessment: Needs assistance Sitting-balance support: Feet supported Sitting balance-Leahy Scale: Fair     Standing balance support: Bilateral upper extremity supported, During functional activity, Reliant on assistive device for balance Standing balance-Leahy Scale: Zero Standing balance comment: MODA +2 for standing balance with BUE support on RW                           ADL either performed or assessed with clinical judgement   ADL Overall ADL's : Needs assistance/impaired Eating/Feeding: Set up;Bed level Eating/Feeding Details (indicate cue type and reason): d/t impaired FMC in dominant RUE  Toilet Transfer Details (indicate cue type and reason): MOD- MAX A +2 for sit>stands only with Rw         Functional mobility during ADLs: +2 for safety/equipment;+2 for physical assistance;Moderate assistance;Maximal  assistance (sit>stands only with RW) General ADL Comments: ADL participation impacted by pain    Extremity/Trunk Assessment Upper Extremity Assessment Upper Extremity Assessment: RUE deficits/detail;Right hand dominant RUE Deficits / Details: R shoulder pain with ROM, decreased grasp noted, pt reports issues with R shoulder in the past but now much worse RUE: Shoulder pain with ROM RUE Coordination: decreased gross motor;decreased fine motor   Lower Extremity Assessment Lower Extremity Assessment: Defer to PT evaluation        Vision Patient Visual Report: No change from baseline     Perception     Praxis     Communication Communication Communication: No apparent difficulties   Cognition Arousal: Alert Behavior During Therapy: WFL for tasks assessed/performed Cognition: No apparent impairments                               Following commands: Intact        Cueing   Cueing Techniques: Verbal cues  Exercises      Shoulder Instructions       General Comments      Pertinent Vitals/ Pain       Pain Assessment Pain Assessment: Faces Faces Pain Scale: Hurts even more Pain Location: R knee Pain Descriptors / Indicators: Discomfort, Grimacing Pain Intervention(s): Limited activity within patient's tolerance, Monitored during session, Repositioned  Home Living                                          Prior Functioning/Environment              Frequency  Min 2X/week        Progress Toward Goals  OT Goals(current goals can now be found in the care plan section)  Progress towards OT goals: Progressing toward goals  Acute Rehab OT Goals Patient Stated Goal: to go to rehab OT Goal Formulation: With patient Time For Goal Achievement: 02/25/24 Potential to Achieve Goals: Good  Plan      Co-evaluation    PT/OT/SLP Co-Evaluation/Treatment: Yes Reason for Co-Treatment: For patient/therapist safety;To address  functional/ADL transfers   OT goals addressed during session: ADL's and self-care      AM-PAC OT 6 Clicks Daily Activity     Outcome Measure   Help from another person eating meals?: A Little Help from another person taking care of personal grooming?: A Little Help from another person toileting, which includes using toliet, bedpan, or urinal?: A Lot Help from another person bathing (including washing, rinsing, drying)?: A Lot Help from another person to put on and taking off regular upper body clothing?: A Little Help from another person to put on and taking off regular lower body clothing?: A Lot 6 Click Score: 15    End of Session Equipment Utilized During Treatment: Gait belt;Rolling walker (2 wheels)  OT Visit Diagnosis: Unsteadiness on feet (R26.81);Muscle weakness (generalized) (M62.81);History of falling (Z91.81);Pain   Activity Tolerance Patient tolerated treatment well   Patient Left in bed;with call bell/phone within reach;with bed alarm set   Nurse Communication Mobility status;Other (comment) (secure chat)        Time: 8561-8498 OT Time Calculation (  min): 23 min  Charges: OT General Charges $OT Visit: 1 Visit OT Treatments $Self Care/Home Management : 8-22 mins  Ronal Mallie POUR., COTA/L Acute Rehabilitation Services [phone]   Ronal Mallie Needy 02/14/2024, 3:28 PM

## 2024-02-15 DIAGNOSIS — A419 Sepsis, unspecified organism: Secondary | ICD-10-CM | POA: Diagnosis not present

## 2024-02-15 LAB — RENAL FUNCTION PANEL
Albumin: 3.2 g/dL — ABNORMAL LOW (ref 3.5–5.0)
Anion gap: 16 — ABNORMAL HIGH (ref 5–15)
BUN: 44 mg/dL — ABNORMAL HIGH (ref 8–23)
CO2: 27 mmol/L (ref 22–32)
Calcium: 9.8 mg/dL (ref 8.9–10.3)
Chloride: 89 mmol/L — ABNORMAL LOW (ref 98–111)
Creatinine, Ser: 7.03 mg/dL — ABNORMAL HIGH (ref 0.61–1.24)
GFR, Estimated: 8 mL/min — ABNORMAL LOW
Glucose, Bld: 89 mg/dL (ref 70–99)
Phosphorus: 4.7 mg/dL — ABNORMAL HIGH (ref 2.5–4.6)
Potassium: 4.7 mmol/L (ref 3.5–5.1)
Sodium: 133 mmol/L — ABNORMAL LOW (ref 135–145)

## 2024-02-15 NOTE — Progress Notes (Signed)
 " [facility] Progress Note   Subjective:    Seen in room No c/o's, in good spirits today  Presentation summary:  64 y.o. male with ESRD on HD, HTN, PAD, h/o CVA, h/o cervical spine fusion in April 2025. He is admitted with new onset right sided weakness. Symptoms started about a week ago. Unable move right leg and has been falling at home. No acute CVA on brain MRI. Cervical spine MRI demonstrates residual mild stenosis C3-4. Neurosurgery consulted. Labs Na 134, K 5.2, BUN 61, WBC 12.1, Hgb 10.1. Febrile on arrival temp 102.1. Blood cultures collected. Receiving antibiotics for suspected otitis media. Presented with ear pain and drainage. Seen and examined in the ED. Progressive right sided weakness. Difficult to lift right arm, leg. He says had similar symptoms when he needed surgery in April. Denies chest pain, sob, nausea/vomiting.  HD MWF. Last dialysis Friday. He usually drives himself but son had to drive d/t weakness. He did complete full treatment. Meeting dry weight. Would be due for dialysis today per his OP clinic holiday schedule.    Objective Vitals:   02/15/24 0131 02/15/24 0633 02/15/24 0740 02/15/24 1100  BP: (!) 87/58 (!) 81/56 (!) 81/49 (!) 89/66  Pulse: 87 86 86 80  Resp: 16 16    Temp: 98.5 F (36.9 C) 97.8 F (36.6 C) 98.2 F (36.8 C)   TempSrc: Oral Oral    SpO2: 99% 96% 93% 95%  Weight:      Height:       Physical Exam General: alert male in NAD Heart: RRR, no murmur Lungs: CTA bilaterally Abdomen: soft, non-distended Extremities: no edema b/l lower extremities Dialysis Access:  AVF+ bruit  Dialysis Orders: MWF NW 4h  B400  87.3kg  2K bath  AVF   Heparin  3000 ESA: Mircera 50 q 2 wks (last 12/15) BMM: Calcitriol  2 mcg PO q HD, cinacalcet  120 mg daily   Assessment/Plan: ESRD: Continue HD on usual MWF schedule Volume: looks euvolemic on exam. Under dry wt, lower dry wt on dc.  Hyperkalemia: Does take Lokelma  on non-HD days, K+ controlled  here on renal diet.  Hypotension: BP chronically low end - continue midodrine  10mg  with HD. Declines blood products/albumin . Anemia of ESRD. Hgb stable. ESA darbe 40mcg sq weekly while here.  R sided weakness. Hx cervical spine injury/fusion. No acute CVA on MRI, does have significant DDD but no cord compression and no surgical recs per neurosurgery note. Ortho involved, does have knee and shoulder pathology. Possibly going to CIR. Fevers/ear pain. Blood Cx negative. Mastoid effusions on MRI. On IV Zosyn . Metabolic bone disease. CorrCa high, continue sevelamer  binder + sensipar , calcitriol  on hold. Nutrition: Alb low, continue supps and renal MVI.    Myer Fret  MD  [facility] 02/15/2024, 1:20 PM  Recent Labs  Lab 02/12/24 1844 02/13/24 0746 02/14/24 0138 02/15/24 0531  HGB 9.3* 10.0*  --   --   ALBUMIN   --  3.1* 3.1* 3.2*  CALCIUM   --  9.8 9.6 9.8  PHOS  --  3.5 5.1* 4.7*  CREATININE  --  5.90* 7.76* 7.03*  K  --  4.3 4.3 4.7    Inpatient medications:  (feeding supplement) PROSource Plus  30 mL Oral BID BM   aspirin  EC  81 mg Oral Daily   atorvastatin   10 mg Oral Daily   Chlorhexidine  Gluconate Cloth  6 each Topical Q0600   cinacalcet   120 mg Oral Q breakfast   ciprofloxacin -dexamethasone   4 drop Left  EAR BID   clopidogrel   75 mg Oral Daily   darbepoetin (ARANESP ) injection - DIALYSIS  40 mcg Subcutaneous Q Fri-1800   gabapentin   300 mg Oral QHS   heparin   5,000 Units Subcutaneous Q8H   lidocaine   1 patch Transdermal Q24H   midodrine   10 mg Oral TID WC   multivitamin  1 tablet Oral QHS   pantoprazole   40 mg Oral BID AC   psyllium  1 packet Oral Daily   sevelamer  carbonate  2,400 mg Oral TID WC   sodium chloride  flush  3 mL Intravenous Q12H    piperacillin -tazobactam (ZOSYN )  IV 2.25 g (02/15/24 0642)   acetaminophen  **OR** acetaminophen , cyclobenzaprine , diphenhydrAMINE , melatonin, midodrine , oxyCODONE -acetaminophen        "

## 2024-02-15 NOTE — Plan of Care (Signed)
   Problem: Education: Goal: Knowledge of General Education information will improve Description Including pain rating scale, medication(s)/side effects and non-pharmacologic comfort measures Outcome: Progressing

## 2024-02-15 NOTE — Plan of Care (Signed)

## 2024-02-15 NOTE — Progress Notes (Addendum)
 " PROGRESS NOTE    Carl Niess Sr.  FMW:[id number] DOB: Sep 06, 1960 DOA: 02/08/2024 PCP: [name], MD   Brief Narrative:   64 y.o. M with ESRD on HD MWF, PVD s/p angioplasty R SFA Oct 2025 for ischemic rest pain as well as C4 spinal fracture in Apr 2025 with incomplete spinal cord injury requiring C4 corpectomy, fusion and interbody cage by [name], wheelchair bound for a time, but progressed by August to walking with a cane and driving(?) who presented with right arm and leg weakness, falls, as well as posterior neck pain, fever, and ear drainage.   Admitted on antibiotics. Neurosurgery, Neurology, Orthopedics, ENT, and Nephrology consulted.   Ultimately diagnosed with otitis media and ruptured ear drum.  Right sided weakness believed to be recrudescence of old spinal cord injury symptoms in setting of infection.  Pending placement to CIR.  Assessment & Plan:  Principal Problem:   Sepsis (HCC) Active Problems:   GERD (gastroesophageal reflux disease)   HLD (hyperlipidemia)   Essential hypertension   Diarrhea, unspecified   ESRD (end stage renal disease) (HCC)   Obstructive sleep apnea   Type 2 diabetes mellitus with diabetic peripheral angiopathy without gangrene (HCC)   Acute suppurative otitis media of left ear with spontaneous rupture of tympanic membrane   Mastoid disorder, bilateral   Conductive hearing loss   Acute right-sided weakness    epsis due to postoperative otitis media with suspected TM perforation Patient was admitted with sepsis present on admission due to suspected suppurative otitis media.  COVID, flu, RVP negative.  Blood cultures no growth.   ENT [name] was consulted, and patient underwent CT of the temporal bones without evidence of coalescent mastoiditis, bone breakdown, or abscess. - Continue Zosyn , day 7 of 10, can transition to Augmentin  at discharge - Continue Ciprodex  drops - Needs ENT follow up with [name] on or around Jan 5-12      Right-sided weakness History of cervical decompression/corpectomy C4 by [name] April 2025 Remote left occipital infarct Admitted and given concern for recurrent spinal cord injury, neurosurgery and neurology were consulted.  Spinal MR shows no new cord injury.  Neurosurgery and Neurology involved, feel that this is recrudescence of old symptoms due to infection. - PT OT - Outpatient Neurosurgery follow up recommended    Right shoulder pain, history of right reverse shoulder arthroplasty Right knee osteoarthritis - Follow up with [facility]   Peripheral vascular disease status post angioplasty October 2025 -Continue aspirin , Lipitor, Plavix    Chronic hypotension,asymptomatic -Continue midodrine    Diabetes mellitus type 2 with neuropathy: Glucose controlled off medication - Continue gabapentin    Hyponatremia Mild stable   Anemia of chronic kidney disease Mild stable  Disposition: Pending CIR placement.  DVT prophylaxis: heparin  injection 5,000 Units Start: 02/09/24 0600     Code Status: Full Code Family Communication: None at the bedside   Status is: Inpatient Remains inpatient appropriate because: Pending placement    Subjective:  No acute issues overnight. He was dialyzed yesterday. Complaining of soreness on his right side. He wants lidocaine  patches for his knees.  Examination:  General exam: Appears calm and comfortable  Respiratory system: Clear to auscultation. Respiratory effort normal. Cardiovascular system: S1 & S2 heard, RRR. No JVD, murmurs, rubs, gallops or clicks. No pedal edema. Gastrointestinal system: Abdomen is nondistended, soft and nontender. No organomegaly or masses felt. Normal bowel sounds heard. Central nervous system: Alert and oriented. No focal neurological deficits. Extremities: Right sided is weaker than the  left Skin: No rashes, lesions or ulcers      Diet Orders (From admission, onward)     Start     Ordered   02/09/24 0442   Diet renal with fluid restriction Fluid restriction: 1200 mL Fluid; Room service appropriate? Yes; Fluid consistency: Thin  Diet effective now       Question Answer Comment  Fluid restriction: 1200 mL Fluid   Room service appropriate? Yes   Fluid consistency: Thin      02/09/24 0441            Objective: Vitals:   02/14/24 2005 02/15/24 0131 02/15/24 0633 02/15/24 0740  BP: (!) 92/55 (!) 87/58 (!) 81/56 (!) 81/49  Pulse:  87 86 86  Resp: 18 16 16    Temp: 99.8 F (37.7 C) 98.5 F (36.9 C) 97.8 F (36.6 C) 98.2 F (36.8 C)  TempSrc: Oral Oral Oral   SpO2: 96% 99% 96% 93%  Weight:      Height:        Intake/Output Summary (Last 24 hours) at 02/15/2024 0958 Last data filed at 02/14/2024 1119 Gross per 24 hour  Intake --  Output 2000 ml  Net -2000 ml   Filed Weights   02/12/24 0500 02/14/24 0500 02/14/24 1119  Weight: 87.6 kg 86.5 kg 84.2 kg    Scheduled Meds:  (feeding supplement) PROSource Plus  30 mL Oral BID BM   aspirin  EC  81 mg Oral Daily   atorvastatin   10 mg Oral Daily   Chlorhexidine  Gluconate Cloth  6 each Topical Q0600   cinacalcet   120 mg Oral Q breakfast   ciprofloxacin -dexamethasone   4 drop Left EAR BID   clopidogrel   75 mg Oral Daily   darbepoetin (ARANESP ) injection - DIALYSIS  40 mcg Subcutaneous Q Fri-1800   gabapentin   300 mg Oral QHS   heparin   5,000 Units Subcutaneous Q8H   lidocaine   1 patch Transdermal Q24H   midodrine   10 mg Oral TID WC   multivitamin  1 tablet Oral QHS   pantoprazole   40 mg Oral BID AC   psyllium  1 packet Oral Daily   sevelamer  carbonate  2,400 mg Oral TID WC   sodium chloride  flush  3 mL Intravenous Q12H   Continuous Infusions:  piperacillin -tazobactam (ZOSYN )  IV 2.25 g (02/15/24 0642)    Nutritional status Signs/Symptoms: estimated needs Interventions: Refer to RD note for recommendations, MVI Body mass index is 24.49 kg/m.  Data Reviewed:   CBC: Recent Labs  Lab 02/08/24 1201 02/09/24 0520  02/10/24 0629 02/12/24 1844 02/13/24 0746  WBC 11.7* 12.1* 9.3 10.0 9.1  NEUTROABS 9.4*  --   --  7.0  --   HGB 11.4* 10.1* 10.0* 9.3* 10.0*  HCT 35.3* 30.8* 29.7* 28.0* 29.8*  MCV 91.7 89.5 88.4 88.9 87.6  PLT 223 216 229 339 353   Basic Metabolic Panel: Recent Labs  Lab 02/11/24 0853 02/12/24 0455 02/13/24 0746 02/14/24 0138 02/15/24 0531  NA 134* 132* 133* 134* 133*  K 4.9 4.7 4.3 4.3 4.7  CL 90* 88* 91* 90* 89*  CO2 28 26 27 29 27   GLUCOSE 84 103* 93 101* 89  BUN 47* 64* 37* 53* 44*  CREATININE 7.86* 9.33* 5.90* 7.76* 7.03*  CALCIUM  10.1 10.1 9.8 9.6 9.8  PHOS 5.0* 5.5* 3.5 5.1* 4.7*   GFR: Estimated Creatinine Clearance: 11.8 mL/min (A) (by C-G formula based on SCr of 7.03 mg/dL (H)). Liver Function Tests: Recent Labs  Lab 02/11/24 [phone]  02/12/24 0455 02/13/24 0746 02/14/24 0138 02/15/24 0531  ALBUMIN  3.1* 3.0* 3.1* 3.1* 3.2*   No results for input(s): LIPASE, AMYLASE in the last 168 hours. No results for input(s): AMMONIA in the last 168 hours. Coagulation Profile: No results for input(s): INR, PROTIME in the last 168 hours. Cardiac Enzymes: No results for input(s): CKTOTAL, CKMB, CKMBINDEX, TROPONINI in the last 168 hours. BNP (last 3 results) No results for input(s): PROBNP in the last 8760 hours. HbA1C: No results for input(s): HGBA1C in the last 72 hours. CBG: No results for input(s): GLUCAP in the last 168 hours. Lipid Profile: No results for input(s): CHOL, HDL, LDLCALC, TRIG, CHOLHDL, LDLDIRECT in the last 72 hours. Thyroid  Function Tests: No results for input(s): TSH, T4TOTAL, FREET4, T3FREE, THYROIDAB in the last 72 hours. Anemia Panel: No results for input(s): VITAMINB12, FOLATE, FERRITIN, TIBC, IRON, RETICCTPCT in the last 72 hours. Sepsis Labs: No results for input(s): PROCALCITON, LATICACIDVEN in the last 168 hours.  Recent Results (from the past 240 hours)  Resp panel by  RT-PCR (RSV, Flu A&B, Covid) Anterior Nasal Swab     Status: None   Collection Time: 02/08/24 12:01 PM   Specimen: Anterior Nasal Swab  Result Value Ref Range Status   SARS Coronavirus 2 by RT PCR NEGATIVE NEGATIVE Final   Influenza A by PCR NEGATIVE NEGATIVE Final   Influenza B by PCR NEGATIVE NEGATIVE Final    Comment: (NOTE) The Xpert Xpress SARS-CoV-2/FLU/RSV plus assay is intended as an aid in the diagnosis of influenza from Nasopharyngeal swab specimens and should not be used as a sole basis for treatment. Nasal washings and aspirates are unacceptable for Xpert Xpress SARS-CoV-2/FLU/RSV testing.  Fact Sheet for Patients: bloggercourse.com  Fact Sheet for Healthcare Providers: seriousbroker.it  This test is not yet approved or cleared by the [place]  FDA and has been authorized for detection and/or diagnosis of SARS-CoV-2 by FDA under an Emergency Use Authorization (EUA). This EUA will remain in effect (meaning this test can be used) for the duration of the COVID-19 declaration under Section 564(b)(1) of the Act, 21 U.S.C. section 360bbb-3(b)(1), unless the authorization is terminated or revoked.     Resp Syncytial Virus by PCR NEGATIVE NEGATIVE Final    Comment: (NOTE) Fact Sheet for Patients: bloggercourse.com  Fact Sheet for Healthcare Providers: seriousbroker.it  This test is not yet approved or cleared by the [place]  FDA and has been authorized for detection and/or diagnosis of SARS-CoV-2 by FDA under an Emergency Use Authorization (EUA). This EUA will remain in effect (meaning this test can be used) for the duration of the COVID-19 declaration under Section 564(b)(1) of the Act, 21 U.S.C. section 360bbb-3(b)(1), unless the authorization is terminated or revoked.  Performed at [facility], [address] [place], [name]   Blood  culture (routine x 2)     Status: None   Collection Time: 02/08/24  9:53 PM   Specimen: BLOOD  Result Value Ref Range Status   Specimen Description BLOOD SITE NOT SPECIFIED  Final   Special Requests   Final    BOTTLES DRAWN AEROBIC AND ANAEROBIC Blood Culture results may not be optimal due to an inadequate volume of blood received in culture bottles   Culture   Final    NO GROWTH 5 DAYS Performed at [facility], [address] [place], [name]    Report Status 02/13/2024 FINAL  Final  Blood culture (routine x 2)  Status: None   Collection Time: 02/08/24 10:39 PM   Specimen: BLOOD  Result Value Ref Range Status   Specimen Description BLOOD BLOOD LEFT WRIST  Final   Special Requests   Final    BOTTLES DRAWN AEROBIC AND ANAEROBIC Blood Culture adequate volume   Culture   Final    NO GROWTH 5 DAYS Performed at [facility], [address], [place], [name]    Report Status 02/13/2024 FINAL  Final  Respiratory (~20 pathogens) panel by PCR     Status: None   Collection Time: 02/09/24  2:13 PM   Specimen: Nasopharyngeal Swab; Respiratory  Result Value Ref Range Status   Adenovirus NOT DETECTED NOT DETECTED Final   Coronavirus 229E NOT DETECTED NOT DETECTED Final    Comment: (NOTE) The Coronavirus on the Respiratory Panel, DOES NOT test for the novel  Coronavirus (2019 nCoV)    Coronavirus HKU1 NOT DETECTED NOT DETECTED Final   Coronavirus NL63 NOT DETECTED NOT DETECTED Final   Coronavirus OC43 NOT DETECTED NOT DETECTED Final   Metapneumovirus NOT DETECTED NOT DETECTED Final   Rhinovirus / Enterovirus NOT DETECTED NOT DETECTED Final   Influenza A NOT DETECTED NOT DETECTED Final   Influenza B NOT DETECTED NOT DETECTED Final   Parainfluenza Virus 1 NOT DETECTED NOT DETECTED Final   Parainfluenza Virus 2 NOT DETECTED NOT DETECTED Final   Parainfluenza Virus 3 NOT DETECTED NOT DETECTED Final   Parainfluenza Virus 4 NOT DETECTED NOT DETECTED Final    Respiratory Syncytial Virus NOT DETECTED NOT DETECTED Final   Bordetella pertussis NOT DETECTED NOT DETECTED Final   Bordetella Parapertussis NOT DETECTED NOT DETECTED Final   Chlamydophila pneumoniae NOT DETECTED NOT DETECTED Final   Mycoplasma pneumoniae NOT DETECTED NOT DETECTED Final    Comment: Performed at [facility], [address], [place], [name]         Radiology Studies: No results found.         LOS: 4 days   Time spent= 35 mins    Deliliah Room, MD [facility]  If 7PM-7AM, please contact night-coverage  02/15/2024, 9:58 AM  "

## 2024-02-16 DIAGNOSIS — A419 Sepsis, unspecified organism: Secondary | ICD-10-CM | POA: Diagnosis not present

## 2024-02-16 LAB — RENAL FUNCTION PANEL
Albumin: 2.7 g/dL — ABNORMAL LOW (ref 3.5–5.0)
Anion gap: 41 — ABNORMAL HIGH (ref 5–15)
BUN: 53 mg/dL — ABNORMAL HIGH (ref 8–23)
CO2: 19 mmol/L — ABNORMAL LOW (ref 22–32)
Calcium: 7.8 mg/dL — ABNORMAL LOW (ref 8.9–10.3)
Chloride: 68 mmol/L — ABNORMAL LOW (ref 98–111)
Creatinine, Ser: 7.19 mg/dL — ABNORMAL HIGH (ref 0.61–1.24)
GFR, Estimated: 8 mL/min — ABNORMAL LOW
Glucose, Bld: 376 mg/dL — ABNORMAL HIGH (ref 70–99)
Phosphorus: 4.1 mg/dL (ref 2.5–4.6)
Potassium: 4.2 mmol/L (ref 3.5–5.1)
Sodium: 128 mmol/L — ABNORMAL LOW (ref 135–145)

## 2024-02-16 LAB — GLUCOSE, CAPILLARY
Glucose-Capillary: 108 mg/dL — ABNORMAL HIGH (ref 70–99)
Glucose-Capillary: 103 mg/dL — ABNORMAL HIGH (ref 70–99)

## 2024-02-16 MED ORDER — CHLORHEXIDINE GLUCONATE CLOTH 2 % EX PADS
6.0000 | MEDICATED_PAD | Freq: Every day | CUTANEOUS | Status: DC
  Administered 2024-02-17: 6 via TOPICAL

## 2024-02-16 MED ORDER — INSULIN ASPART 100 UNIT/ML IJ SOLN
0.0000 [IU] | Freq: Three times a day (TID) | INTRAMUSCULAR | Status: DC
  Filled 2024-02-16 (×3): qty 1
  Filled 2024-02-16: qty 2

## 2024-02-16 MED ORDER — INSULIN ASPART 100 UNIT/ML IJ SOLN: 0.0000 [IU] | Freq: Every day | INTRAMUSCULAR | Status: DC

## 2024-02-16 NOTE — Plan of Care (Signed)

## 2024-02-16 NOTE — Progress Notes (Signed)
 " PROGRESS NOTE    Carl Dy Sr.  FMW:[id number] DOB: [birth year] DOA: 02/08/2024 PCP: [name], MD   Brief Narrative:   64 y.o. M with ESRD on HD MWF, PVD s/p angioplasty R SFA Oct 2025 for ischemic rest pain as well as C4 spinal fracture in Apr 2025 with incomplete spinal cord injury requiring C4 corpectomy, fusion and interbody cage by [name], wheelchair bound for a time, but progressed by August to walking with a cane and driving(?) who presented with right arm and leg weakness, falls, as well as posterior neck pain, fever, and ear drainage.   Admitted on antibiotics. Neurosurgery, Neurology, Orthopedics, ENT, and Nephrology consulted.   Ultimately diagnosed with otitis media and ruptured ear drum.  Right sided weakness believed to be recrudescence of old spinal cord injury symptoms in setting of infection.  Pending placement to CIR.  Assessment & Plan:  Principal Problem:   Sepsis (HCC) Active Problems:   GERD (gastroesophageal reflux disease)   HLD (hyperlipidemia)   Essential hypertension   Diarrhea, unspecified   ESRD (end stage renal disease) (HCC)   Obstructive sleep apnea   Type 2 diabetes mellitus with diabetic peripheral angiopathy without gangrene (HCC)   Acute suppurative otitis media of left ear with spontaneous rupture of tympanic membrane   Mastoid disorder, bilateral   Conductive hearing loss   Acute right-sided weakness    epsis due to postoperative otitis media with suspected TM perforation Patient was admitted with sepsis present on admission due to suspected suppurative otitis media.  COVID, flu, RVP negative.  Blood cultures no growth.   ENT [name] was consulted, and patient underwent CT of the temporal bones without evidence of coalescent mastoiditis, bone breakdown, or abscess. - Continue Zosyn , day 8 of 10, can transition to Augmentin  at discharge - Continue Ciprodex  drops - Needs ENT follow up with [name] on or around Jan 5-12      Right-sided weakness History of cervical decompression/corpectomy C4 by [name] April 2025 Remote left occipital infarct Admitted and given concern for recurrent spinal cord injury, neurosurgery and neurology were consulted.  Spinal MR shows no new cord injury.  Neurosurgery and Neurology involved, feel that this is recrudescence of old symptoms due to infection. - PT OT - Outpatient Neurosurgery follow up recommended    Right shoulder pain, history of right reverse shoulder arthroplasty Right knee osteoarthritis - Follow up with [facility]   Peripheral vascular disease status post angioplasty October 2025 -Continue aspirin , Lipitor, Plavix    Chronic hypotension,asymptomatic -Continue midodrine    Diabetes mellitus type 2 with neuropathy: Glucose controlled off medication - Continue gabapentin    Hyponatremia Mild stable   Anemia of chronic kidney disease Mild stable  Disposition: Pending CIR placement.  DVT prophylaxis: heparin  injection 5,000 Units Start: 02/09/24 0600     Code Status: Full Code Family Communication: None at the bedside   Status is: Inpatient Remains inpatient appropriate because: Pending placement    Subjective:  No acute issues overnight. He was dialyzed yesterday and next HD will be tomorrow. No active complaints.  Examination:  General exam: Appears calm and comfortable  Respiratory system: Clear to auscultation. Respiratory effort normal. Cardiovascular system: S1 & S2 heard, RRR. No JVD, murmurs, rubs, gallops or clicks. No pedal edema. Gastrointestinal system: Abdomen is nondistended, soft and nontender. No organomegaly or masses felt. Normal bowel sounds heard. Central nervous system: Alert and oriented. No focal neurological deficits. Extremities: Right sided is weaker than the left Skin: No rashes, lesions  or ulcers      Diet Orders (From admission, onward)     Start     Ordered   02/09/24 0442  Diet renal with fluid restriction  Fluid restriction: 1200 mL Fluid; Room service appropriate? Yes; Fluid consistency: Thin  Diet effective now       Question Answer Comment  Fluid restriction: 1200 mL Fluid   Room service appropriate? Yes   Fluid consistency: Thin      02/09/24 0441            Objective: Vitals:   02/16/24 0113 02/16/24 0402 02/16/24 0404 02/16/24 0831  BP: (!) 98/58 (!) 88/62  96/61  Pulse: 79 91  75  Resp: 15 16    Temp:  99.6 F (37.6 C)  99.4 F (37.4 C)  TempSrc:  Oral  Oral  SpO2: 98% 100%  98%  Weight:   84.2 kg   Height:       No intake or output data in the 24 hours ending 02/16/24 0946  Filed Weights   02/14/24 0500 02/14/24 1119 02/16/24 0404  Weight: 86.5 kg 84.2 kg 84.2 kg    Scheduled Meds:  (feeding supplement) PROSource Plus  30 mL Oral BID BM   aspirin  EC  81 mg Oral Daily   atorvastatin   10 mg Oral Daily   Chlorhexidine  Gluconate Cloth  6 each Topical Q0600   cinacalcet   120 mg Oral Q breakfast   ciprofloxacin -dexamethasone   4 drop Left EAR BID   clopidogrel   75 mg Oral Daily   darbepoetin (ARANESP ) injection - DIALYSIS  40 mcg Subcutaneous Q Fri-1800   gabapentin   300 mg Oral QHS   heparin   5,000 Units Subcutaneous Q8H   lidocaine   1 patch Transdermal Q24H   midodrine   10 mg Oral TID WC   multivitamin  1 tablet Oral QHS   pantoprazole   40 mg Oral BID AC   psyllium  1 packet Oral Daily   sevelamer  carbonate  2,400 mg Oral TID WC   sodium chloride  flush  3 mL Intravenous Q12H   Continuous Infusions:  piperacillin -tazobactam (ZOSYN )  IV 2.25 g (02/16/24 0520)    Nutritional status Signs/Symptoms: estimated needs Interventions: Refer to RD note for recommendations, MVI Body mass index is 24.49 kg/m.  Data Reviewed:   CBC: Recent Labs  Lab 02/10/24 0629 02/12/24 1844 02/13/24 0746  WBC 9.3 10.0 9.1  NEUTROABS  --  7.0  --   HGB 10.0* 9.3* 10.0*  HCT 29.7* 28.0* 29.8*  MCV 88.4 88.9 87.6  PLT 229 339 353   Basic Metabolic Panel: Recent Labs   Lab 02/12/24 0455 02/13/24 0746 02/14/24 0138 02/15/24 0531 02/16/24 0545  NA 132* 133* 134* 133* 128*  K 4.7 4.3 4.3 4.7 4.2  CL 88* 91* 90* 89* 68*  CO2 26 27 29 27  19*  GLUCOSE 103* 93 101* 89 376*  BUN 64* 37* 53* 44* 53*  CREATININE 9.33* 5.90* 7.76* 7.03* 7.19*  CALCIUM  10.1 9.8 9.6 9.8 7.8*  PHOS 5.5* 3.5 5.1* 4.7* 4.1   GFR: Estimated Creatinine Clearance: 11.6 mL/min (A) (by C-G formula based on SCr of 7.19 mg/dL (H)). Liver Function Tests: Recent Labs  Lab 02/12/24 0455 02/13/24 0746 02/14/24 0138 02/15/24 0531 02/16/24 0545  ALBUMIN  3.0* 3.1* 3.1* 3.2* 2.7*   No results for input(s): LIPASE, AMYLASE in the last 168 hours. No results for input(s): AMMONIA in the last 168 hours. Coagulation Profile: No results for input(s): INR, PROTIME in the last  168 hours. Cardiac Enzymes: No results for input(s): CKTOTAL, CKMB, CKMBINDEX, TROPONINI in the last 168 hours. BNP (last 3 results) No results for input(s): PROBNP in the last 8760 hours. HbA1C: No results for input(s): HGBA1C in the last 72 hours. CBG: No results for input(s): GLUCAP in the last 168 hours. Lipid Profile: No results for input(s): CHOL, HDL, LDLCALC, TRIG, CHOLHDL, LDLDIRECT in the last 72 hours. Thyroid  Function Tests: No results for input(s): TSH, T4TOTAL, FREET4, T3FREE, THYROIDAB in the last 72 hours. Anemia Panel: No results for input(s): VITAMINB12, FOLATE, FERRITIN, TIBC, IRON, RETICCTPCT in the last 72 hours. Sepsis Labs: No results for input(s): PROCALCITON, LATICACIDVEN in the last 168 hours.  Recent Results (from the past 240 hours)  Resp panel by RT-PCR (RSV, Flu A&B, Covid) Anterior Nasal Swab     Status: None   Collection Time: 02/08/24 12:01 PM   Specimen: Anterior Nasal Swab  Result Value Ref Range Status   SARS Coronavirus 2 by RT PCR NEGATIVE NEGATIVE Final   Influenza A by PCR NEGATIVE NEGATIVE Final    Influenza B by PCR NEGATIVE NEGATIVE Final    Comment: (NOTE) The Xpert Xpress SARS-CoV-2/FLU/RSV plus assay is intended as an aid in the diagnosis of influenza from Nasopharyngeal swab specimens and should not be used as a sole basis for treatment. Nasal washings and aspirates are unacceptable for Xpert Xpress SARS-CoV-2/FLU/RSV testing.  Fact Sheet for Patients: bloggercourse.com  Fact Sheet for Healthcare Providers: seriousbroker.it  This test is not yet approved or cleared by the [place]  FDA and has been authorized for detection and/or diagnosis of SARS-CoV-2 by FDA under an Emergency Use Authorization (EUA). This EUA will remain in effect (meaning this test can be used) for the duration of the COVID-19 declaration under Section 564(b)(1) of the Act, 21 U.S.C. section 360bbb-3(b)(1), unless the authorization is terminated or revoked.     Resp Syncytial Virus by PCR NEGATIVE NEGATIVE Final    Comment: (NOTE) Fact Sheet for Patients: bloggercourse.com  Fact Sheet for Healthcare Providers: seriousbroker.it  This test is not yet approved or cleared by the [place]  FDA and has been authorized for detection and/or diagnosis of SARS-CoV-2 by FDA under an Emergency Use Authorization (EUA). This EUA will remain in effect (meaning this test can be used) for the duration of the COVID-19 declaration under Section 564(b)(1) of the Act, 21 U.S.C. section 360bbb-3(b)(1), unless the authorization is terminated or revoked.  Performed at [facility], [address] [place], [name]   Blood culture (routine x 2)     Status: None   Collection Time: 02/08/24  9:53 PM   Specimen: BLOOD  Result Value Ref Range Status   Specimen Description BLOOD SITE NOT SPECIFIED  Final   Special Requests   Final    BOTTLES DRAWN AEROBIC AND ANAEROBIC Blood Culture results may  not be optimal due to an inadequate volume of blood received in culture bottles   Culture   Final    NO GROWTH 5 DAYS Performed at [facility], [address] [place], [name]    Report Status 02/13/2024 FINAL  Final  Blood culture (routine x 2)     Status: None   Collection Time: 02/08/24 10:39 PM   Specimen: BLOOD  Result Value Ref Range Status   Specimen Description BLOOD BLOOD LEFT WRIST  Final   Special Requests   Final    BOTTLES DRAWN AEROBIC AND ANAEROBIC Blood Culture adequate volume  Culture   Final    NO GROWTH 5 DAYS Performed at [facility], [address] [place], [name]    Report Status 02/13/2024 FINAL  Final  Respiratory (~20 pathogens) panel by PCR     Status: None   Collection Time: 02/09/24  2:13 PM   Specimen: Nasopharyngeal Swab; Respiratory  Result Value Ref Range Status   Adenovirus NOT DETECTED NOT DETECTED Final   Coronavirus 229E NOT DETECTED NOT DETECTED Final    Comment: (NOTE) The Coronavirus on the Respiratory Panel, DOES NOT test for the novel  Coronavirus (2019 nCoV)    Coronavirus HKU1 NOT DETECTED NOT DETECTED Final   Coronavirus NL63 NOT DETECTED NOT DETECTED Final   Coronavirus OC43 NOT DETECTED NOT DETECTED Final   Metapneumovirus NOT DETECTED NOT DETECTED Final   Rhinovirus / Enterovirus NOT DETECTED NOT DETECTED Final   Influenza A NOT DETECTED NOT DETECTED Final   Influenza B NOT DETECTED NOT DETECTED Final   Parainfluenza Virus 1 NOT DETECTED NOT DETECTED Final   Parainfluenza Virus 2 NOT DETECTED NOT DETECTED Final   Parainfluenza Virus 3 NOT DETECTED NOT DETECTED Final   Parainfluenza Virus 4 NOT DETECTED NOT DETECTED Final   Respiratory Syncytial Virus NOT DETECTED NOT DETECTED Final   Bordetella pertussis NOT DETECTED NOT DETECTED Final   Bordetella Parapertussis NOT DETECTED NOT DETECTED Final   Chlamydophila pneumoniae NOT DETECTED NOT DETECTED Final   Mycoplasma pneumoniae NOT DETECTED  NOT DETECTED Final    Comment: Performed at [facility], [address], [place], [name]         Radiology Studies: No results found.         LOS: 5 days   Time spent= 35 mins    Deliliah Room, MD [facility]  If 7PM-7AM, please contact night-coverage  02/16/2024, 9:46 AM  "

## 2024-02-16 NOTE — Progress Notes (Signed)
 " [facility] Progress Note   Subjective:    Seen in room No c/o's, in good spirits today  Presentation summary:  64 y.o. male with ESRD on HD, HTN, PAD, h/o CVA, h/o cervical spine fusion in April 2025. He is admitted with new onset right sided weakness. Symptoms started about a week ago. Unable move right leg and has been falling at home. No acute CVA on brain MRI. Cervical spine MRI demonstrates residual mild stenosis C3-4. Neurosurgery consulted. Labs Na 134, K 5.2, BUN 61, WBC 12.1, Hgb 10.1. Febrile on arrival temp 102.1. Blood cultures collected. Receiving antibiotics for suspected otitis media. Presented with ear pain and drainage. Seen and examined in the ED. Progressive right sided weakness. Difficult to lift right arm, leg. He says had similar symptoms when he needed surgery in April. Denies chest pain, sob, nausea/vomiting.  HD MWF. Last dialysis Friday. He usually drives himself but son had to drive d/t weakness. He did complete full treatment. Meeting dry weight. Would be due for dialysis today per his OP clinic holiday schedule.    Objective Vitals:   02/16/24 0113 02/16/24 0402 02/16/24 0404 02/16/24 0831  BP: (!) 98/58 (!) 88/62  96/61  Pulse: 79 91  75  Resp: 15 16    Temp:  99.6 F (37.6 C)  99.4 F (37.4 C)  TempSrc:  Oral  Oral  SpO2: 98% 100%  98%  Weight:   84.2 kg   Height:       Physical Exam General: alert male in NAD Heart: RRR, no murmur Lungs: CTA bilaterally Abdomen: soft, non-distended Extremities: no edema b/l lower extremities Dialysis Access:  AVF+ bruit  Dialysis Orders: MWF NW 4h  B400  87.3kg  2K bath  AVF   Heparin  3000 ESA: Mircera 50 q 2 wks (last 12/15) BMM: Calcitriol  2 mcg PO q HD, cinacalcet  120 mg daily   Assessment/Plan: ESRD: on HD MWF. HD tomorrow.  Volume: looks euvolemic on exam. Under dry wt, lower dry wt on dc.  Hypotension: BP chronically low end - continue midodrine  10mg  with HD. Declines blood  products/albumin . Anemia of ESRD. Hgb stable. ESA darbe 40mcg sq weekly while here.  R sided weakness. Hx cervical spine injury/fusion. No acute CVA on MRI, does have significant DDD but no cord compression and no surgical recs per neurosurgery note. Ortho involved, does have knee and shoulder pathology. Possibly going to CIR. Mastoiditis/ sepsis: w/ ear pain. Blood Cx negative. Mastoid effusions on MRI. On IV Zosyn . Sepsis present on admission felt to be due to suspected suppurative otitis media.  Metabolic bone disease. CorrCa high, continue sevelamer  binder + sensipar , calcitriol  on hold. Nutrition: Alb low, continue supps and renal MVI.  Debility: pending CIR placement    Myer Fret  MD  [facility] 02/16/2024, 1:26 PM  Recent Labs  Lab 02/12/24 1844 02/13/24 0746 02/14/24 0138 02/15/24 0531 02/16/24 0545  HGB 9.3* 10.0*  --   --   --   ALBUMIN   --  3.1*   < > 3.2* 2.7*  CALCIUM   --  9.8   < > 9.8 7.8*  PHOS  --  3.5   < > 4.7* 4.1  CREATININE  --  5.90*   < > 7.03* 7.19*  K  --  4.3   < > 4.7 4.2   < > = values in this interval not displayed.    Inpatient medications:  (feeding supplement) PROSource Plus  30 mL Oral BID BM   aspirin   EC  81 mg Oral Daily   atorvastatin   10 mg Oral Daily   Chlorhexidine  Gluconate Cloth  6 each Topical Q0600   cinacalcet   120 mg Oral Q breakfast   ciprofloxacin -dexamethasone   4 drop Left EAR BID   clopidogrel   75 mg Oral Daily   darbepoetin (ARANESP ) injection - DIALYSIS  40 mcg Subcutaneous Q Fri-1800   gabapentin   300 mg Oral QHS   heparin   5,000 Units Subcutaneous Q8H   lidocaine   1 patch Transdermal Q24H   midodrine   10 mg Oral TID WC   multivitamin  1 tablet Oral QHS   pantoprazole   40 mg Oral BID AC   psyllium  1 packet Oral Daily   sevelamer  carbonate  2,400 mg Oral TID WC   sodium chloride  flush  3 mL Intravenous Q12H    piperacillin -tazobactam (ZOSYN )  IV 2.25 g (02/16/24 1322)   acetaminophen  **OR** acetaminophen , cyclobenzaprine ,  diphenhydrAMINE , melatonin, midodrine , oxyCODONE -acetaminophen        "

## 2024-02-16 NOTE — Progress Notes (Signed)
 Inpatient Rehab Admissions Coordinator:  Insurance is offering a peer-to-peer that has to be completed by 12pm on 02/17/24. Will continue to follow.   Tinnie Yvone Cohens, MS, CCC-SLP Admissions Coordinator [phone]

## 2024-02-17 DIAGNOSIS — A419 Sepsis, unspecified organism: Secondary | ICD-10-CM | POA: Diagnosis not present

## 2024-02-17 LAB — RENAL FUNCTION PANEL
Albumin: 2.9 g/dL — ABNORMAL LOW (ref 3.5–5.0)
Anion gap: 20 — ABNORMAL HIGH (ref 5–15)
BUN: 80 mg/dL — ABNORMAL HIGH (ref 8–23)
CO2: 23 mmol/L (ref 22–32)
Calcium: 9.2 mg/dL (ref 8.9–10.3)
Chloride: 86 mmol/L — ABNORMAL LOW (ref 98–111)
Creatinine, Ser: 11 mg/dL — ABNORMAL HIGH (ref 0.61–1.24)
GFR, Estimated: 5 mL/min — ABNORMAL LOW
Glucose, Bld: 80 mg/dL (ref 70–99)
Phosphorus: 5.8 mg/dL — ABNORMAL HIGH (ref 2.5–4.6)
Potassium: 5.6 mmol/L — ABNORMAL HIGH (ref 3.5–5.1)
Sodium: 129 mmol/L — ABNORMAL LOW (ref 135–145)

## 2024-02-17 LAB — GLUCOSE, CAPILLARY: Glucose-Capillary: 92 mg/dL (ref 70–99)

## 2024-02-17 MED ORDER — AMOXICILLIN-POT CLAVULANATE 500-125 MG PO TABS
1.0000 | ORAL_TABLET | Freq: Two times a day (BID) | ORAL | Status: AC
  Administered 2024-02-17 – 2024-02-18 (×4): 1 via ORAL
  Filled 2024-02-17 (×4): qty 1

## 2024-02-17 NOTE — Progress Notes (Signed)
 Inpatient Rehab Admissions Coordinator:   Patient was denied CIR admission from insurance on peer to peer. Will require alternative discharge planning.   Rehab Admissons Coordinator Caleigha Zale, [place], [place] [phone]

## 2024-02-17 NOTE — Progress Notes (Signed)
 " [facility] Progress Note   Subjective:    Seen in room No c/o's  Presentation summary:  64 y.o. male with ESRD on HD, HTN, PAD, h/o CVA, h/o cervical spine fusion in April 2025. He is admitted with new onset right sided weakness. Symptoms started about a week ago. Unable move right leg and has been falling at home. No acute CVA on brain MRI. Cervical spine MRI demonstrates residual mild stenosis C3-4. Neurosurgery consulted. Labs Na 134, K 5.2, BUN 61, WBC 12.1, Hgb 10.1. Febrile on arrival temp 102.1. Blood cultures collected. Receiving antibiotics for suspected otitis media. Presented with ear pain and drainage. Seen and examined in the ED. Progressive right sided weakness. Difficult to lift right arm, leg. He says had similar symptoms when he needed surgery in April. Denies chest pain, sob, nausea/vomiting.  HD MWF. Last dialysis Friday. He usually drives himself but son had to drive d/t weakness. He did complete full treatment. Meeting dry weight. Would be due for dialysis today per his OP clinic holiday schedule.    Objective Vitals:   02/17/24 0028 02/17/24 0500 02/17/24 0632 02/17/24 0810  BP: 101/66  99/63 105/69  Pulse: 90  78 78  Resp: 18     Temp: (!) 101.3 F (38.5 C)  98.6 F (37 C) (!) 97.5 F (36.4 C)  TempSrc: Oral  Oral Oral  SpO2: 98%  100% 98%  Weight:  86.1 kg    Height:       Physical Exam General: alert male in NAD Heart: RRR, no murmur Lungs: CTA bilaterally Abdomen: soft, non-distended Extremities: no edema b/l lower extremities Dialysis Access:  AVF+ bruit  Dialysis Orders: MWF NW 4h  B400  87.3kg  2K bath  AVF   Heparin  3000 ESA: Mircera 50 q 2 wks (last 12/15) BMM: Calcitriol  2 mcg PO q HD, cinacalcet  120 mg daily   Assessment/Plan: ESRD: on HD MWF. HD today.  Volume: looks euvolemic on exam. Under dry wt, lower dry wt on dc.  Hypotension: BP chronically low end - continue midodrine  10mg  with HD. Declines blood  products/albumin . Anemia of ESRD. Hgb stable. ESA darbe 40mcg sq weekly while here.  R sided weakness. Hx cervical spine injury/fusion. No acute CVA on MRI, does have significant DDD but no cord compression and no surgical recs per neurosurgery note. Ortho involved, does have knee and shoulder pathology. Possibly going to CIR. Mastoiditis/ sepsis: w/ ear pain. Blood Cx negative. Mastoid effusions on MRI. On IV Zosyn . Sepsis present on admission felt to be due to suspected suppurative otitis media.  Metabolic bone disease. CorrCa high, continue sevelamer  binder + sensipar , calcitriol  on hold. Nutrition: Alb low, continue supps and renal MVI.  Debility: possible CIR   Myer Fret  MD  [facility] 02/17/2024, 1:08 PM  Recent Labs  Lab 02/12/24 1844 02/13/24 0746 02/14/24 0138 02/16/24 0545 02/17/24 0447  HGB 9.3* 10.0*  --   --   --   ALBUMIN   --  3.1*   < > 2.7* 2.9*  CALCIUM   --  9.8   < > 7.8* 9.2  PHOS  --  3.5   < > 4.1 5.8*  CREATININE  --  5.90*   < > 7.19* 11.00*  K  --  4.3   < > 4.2 5.6*   < > = values in this interval not displayed.    Inpatient medications:  (feeding supplement) PROSource Plus  30 mL Oral BID BM   amoxicillin -clavulanate  1 tablet  Oral Q12H   aspirin  EC  81 mg Oral Daily   atorvastatin   10 mg Oral Daily   Chlorhexidine  Gluconate Cloth  6 each Topical Q0600   Chlorhexidine  Gluconate Cloth  6 each Topical Q0600   cinacalcet   120 mg Oral Q breakfast   ciprofloxacin -dexamethasone   4 drop Left EAR BID   clopidogrel   75 mg Oral Daily   darbepoetin (ARANESP ) injection - DIALYSIS  40 mcg Subcutaneous Q Fri-1800   gabapentin   300 mg Oral QHS   heparin   5,000 Units Subcutaneous Q8H   insulin  aspart  0-5 Units Subcutaneous QHS   insulin  aspart  0-6 Units Subcutaneous TID WC   lidocaine   1 patch Transdermal Q24H   midodrine   10 mg Oral TID WC   multivitamin  1 tablet Oral QHS   pantoprazole   40 mg Oral BID AC   psyllium  1 packet Oral Daily   sevelamer  carbonate   2,400 mg Oral TID WC   sodium chloride  flush  3 mL Intravenous Q12H     acetaminophen  **OR** acetaminophen , cyclobenzaprine , diphenhydrAMINE , melatonin, midodrine , oxyCODONE -acetaminophen        "

## 2024-02-17 NOTE — Progress Notes (Signed)
 " PROGRESS NOTE    Carl Difrancesco Sr.  FMW:[id number] DOB: 1960/10/14 DOA: 02/08/2024 PCP: [name], MD   Brief Narrative:   64 y.o. M with ESRD on HD MWF, PVD s/p angioplasty R SFA Oct 2025 for ischemic rest pain as well as C4 spinal fracture in Apr 2025 with incomplete spinal cord injury requiring C4 corpectomy, fusion and interbody cage by [name], wheelchair bound for a time, but progressed by August to walking with a cane and driving(?) who presented with right arm and leg weakness, falls, as well as posterior neck pain, fever, and ear drainage.   Admitted on antibiotics. Neurosurgery, Neurology, Orthopedics, ENT, and Nephrology consulted.   Ultimately diagnosed with otitis media and ruptured ear drum.  Right sided weakness believed to be recrudescence of old spinal cord injury symptoms in setting of infection.  Pending placement to CIR.  Assessment & Plan:  Principal Problem:   Sepsis (HCC) Active Problems:   GERD (gastroesophageal reflux disease)   HLD (hyperlipidemia)   Essential hypertension   Diarrhea, unspecified   ESRD (end stage renal disease) (HCC)   Obstructive sleep apnea   Type 2 diabetes mellitus with diabetic peripheral angiopathy without gangrene (HCC)   Acute suppurative otitis media of left ear with spontaneous rupture of tympanic membrane   Mastoid disorder, bilateral   Conductive hearing loss   Acute right-sided weakness    Sepsis due to postoperative otitis media with suspected TM perforation Patient was admitted with sepsis present on admission due to suspected suppurative otitis media.  COVID, flu, RVP negative.  Blood cultures no growth.   ENT [name] was consulted, and patient underwent CT of the temporal bones without evidence of coalescent mastoiditis, bone breakdown, or abscess. - Dced zosyn  on 1/4 and transitioned to oral augmentin . Patient lost his IV. - Continue Ciprodex  drops - Needs ENT follow up with [name] on or around Jan  5-12     Right-sided weakness History of cervical decompression/corpectomy C4 by [name] April 2025 Remote left occipital infarct Admitted and given concern for recurrent spinal cord injury, neurosurgery and neurology were consulted.  Spinal MR shows no new cord injury.  Neurosurgery and Neurology involved, feel that this is recrudescence of old symptoms due to infection. - PT OT - Outpatient Neurosurgery follow up recommended    Right shoulder pain, history of right reverse shoulder arthroplasty Right knee osteoarthritis - Follow up with [facility]   Peripheral vascular disease status post angioplasty October 2025 -Continue aspirin , Lipitor, Plavix   ESRD, on HD: MWF Nephrology on board HD to be done today.   Chronic hypotension,asymptomatic -Continue midodrine    Diabetes mellitus type 2 with neuropathy: Glucose controlled off medication - Continue gabapentin    Hyponatremia Mild stable   Anemia of chronic kidney disease Mild stable  Disposition: Pending CIR placement.  DVT prophylaxis: heparin  injection 5,000 Units Start: 02/09/24 0600     Code Status: Full Code Family Communication: None at the bedside   Status is: Inpatient Remains inpatient appropriate because: Pending placement    Subjective:  No acute issues overnight. HD will be done today.  Examination:  General exam: Appears calm and comfortable  Respiratory system: Clear to auscultation. Respiratory effort normal. Cardiovascular system: S1 & S2 heard, RRR. No JVD, murmurs, rubs, gallops or clicks. No pedal edema. Gastrointestinal system: Abdomen is nondistended, soft and nontender. No organomegaly or masses felt. Normal bowel sounds heard. Central nervous system: Alert and oriented. No focal neurological deficits. Extremities: Right sided is weaker than  the left Skin: No rashes, lesions or ulcers      Diet Orders (From admission, onward)     Start     Ordered   02/09/24 0442  Diet renal with  fluid restriction Fluid restriction: 1200 mL Fluid; Room service appropriate? Yes; Fluid consistency: Thin  Diet effective now       Question Answer Comment  Fluid restriction: 1200 mL Fluid   Room service appropriate? Yes   Fluid consistency: Thin      02/09/24 0441            Objective: Vitals:   02/17/24 0028 02/17/24 0500 02/17/24 0632 02/17/24 0810  BP: 101/66  99/63 105/69  Pulse: 90  78 78  Resp: 18     Temp: (!) 101.3 F (38.5 C)  98.6 F (37 C) (!) 97.5 F (36.4 C)  TempSrc: Oral  Oral Oral  SpO2: 98%  100% 98%  Weight:  86.1 kg    Height:        Intake/Output Summary (Last 24 hours) at 02/17/2024 0913 Last data filed at 02/16/2024 2147 Gross per 24 hour  Intake 3 ml  Output --  Net 3 ml    Filed Weights   02/14/24 1119 02/16/24 0404 02/17/24 0500  Weight: 84.2 kg 84.2 kg 86.1 kg    Scheduled Meds:  (feeding supplement) PROSource Plus  30 mL Oral BID BM   amoxicillin -clavulanate  1 tablet Oral Q12H   aspirin  EC  81 mg Oral Daily   atorvastatin   10 mg Oral Daily   Chlorhexidine  Gluconate Cloth  6 each Topical Q0600   Chlorhexidine  Gluconate Cloth  6 each Topical Q0600   cinacalcet   120 mg Oral Q breakfast   ciprofloxacin -dexamethasone   4 drop Left EAR BID   clopidogrel   75 mg Oral Daily   darbepoetin (ARANESP ) injection - DIALYSIS  40 mcg Subcutaneous Q Fri-1800   gabapentin   300 mg Oral QHS   heparin   5,000 Units Subcutaneous Q8H   insulin  aspart  0-5 Units Subcutaneous QHS   insulin  aspart  0-6 Units Subcutaneous TID WC   lidocaine   1 patch Transdermal Q24H   midodrine   10 mg Oral TID WC   multivitamin  1 tablet Oral QHS   pantoprazole   40 mg Oral BID AC   psyllium  1 packet Oral Daily   sevelamer  carbonate  2,400 mg Oral TID WC   sodium chloride  flush  3 mL Intravenous Q12H   Continuous Infusions:    Nutritional status Signs/Symptoms: estimated needs Interventions: Refer to RD note for recommendations, MVI Body mass index is 25.04  kg/m.  Data Reviewed:   CBC: Recent Labs  Lab 02/12/24 1844 02/13/24 0746  WBC 10.0 9.1  NEUTROABS 7.0  --   HGB 9.3* 10.0*  HCT 28.0* 29.8*  MCV 88.9 87.6  PLT 339 353   Basic Metabolic Panel: Recent Labs  Lab 02/13/24 0746 02/14/24 0138 02/15/24 0531 02/16/24 0545 02/17/24 0447  NA 133* 134* 133* 128* 129*  K 4.3 4.3 4.7 4.2 5.6*  CL 91* 90* 89* 68* 86*  CO2 27 29 27  19* 23  GLUCOSE 93 101* 89 376* 80  BUN 37* 53* 44* 53* 80*  CREATININE 5.90* 7.76* 7.03* 7.19* 11.00*  CALCIUM  9.8 9.6 9.8 7.8* 9.2  PHOS 3.5 5.1* 4.7* 4.1 5.8*   GFR: Estimated Creatinine Clearance: 7.6 mL/min (A) (by C-G formula based on SCr of 11 mg/dL (H)). Liver Function Tests: Recent Labs  Lab 02/13/24 0746 02/14/24 0138  02/15/24 0531 02/16/24 0545 02/17/24 0447  ALBUMIN  3.1* 3.1* 3.2* 2.7* 2.9*   No results for input(s): LIPASE, AMYLASE in the last 168 hours. No results for input(s): AMMONIA in the last 168 hours. Coagulation Profile: No results for input(s): INR, PROTIME in the last 168 hours. Cardiac Enzymes: No results for input(s): CKTOTAL, CKMB, CKMBINDEX, TROPONINI in the last 168 hours. BNP (last 3 results) No results for input(s): PROBNP in the last 8760 hours. HbA1C: No results for input(s): HGBA1C in the last 72 hours. CBG: Recent Labs  Lab 02/16/24 1548 02/16/24 2124  GLUCAP 108* 103*   Lipid Profile: No results for input(s): CHOL, HDL, LDLCALC, TRIG, CHOLHDL, LDLDIRECT in the last 72 hours. Thyroid  Function Tests: No results for input(s): TSH, T4TOTAL, FREET4, T3FREE, THYROIDAB in the last 72 hours. Anemia Panel: No results for input(s): VITAMINB12, FOLATE, FERRITIN, TIBC, IRON, RETICCTPCT in the last 72 hours. Sepsis Labs: No results for input(s): PROCALCITON, LATICACIDVEN in the last 168 hours.  Recent Results (from the past 240 hours)  Resp panel by RT-PCR (RSV, Flu A&B, Covid) Anterior Nasal Swab      Status: None   Collection Time: 02/08/24 12:01 PM   Specimen: Anterior Nasal Swab  Result Value Ref Range Status   SARS Coronavirus 2 by RT PCR NEGATIVE NEGATIVE Final   Influenza A by PCR NEGATIVE NEGATIVE Final   Influenza B by PCR NEGATIVE NEGATIVE Final    Comment: (NOTE) The Xpert Xpress SARS-CoV-2/FLU/RSV plus assay is intended as an aid in the diagnosis of influenza from Nasopharyngeal swab specimens and should not be used as a sole basis for treatment. Nasal washings and aspirates are unacceptable for Xpert Xpress SARS-CoV-2/FLU/RSV testing.  Fact Sheet for Patients: bloggercourse.com  Fact Sheet for Healthcare Providers: seriousbroker.it  This test is not yet approved or cleared by the [place]  FDA and has been authorized for detection and/or diagnosis of SARS-CoV-2 by FDA under an Emergency Use Authorization (EUA). This EUA will remain in effect (meaning this test can be used) for the duration of the COVID-19 declaration under Section 564(b)(1) of the Act, 21 U.S.C. section 360bbb-3(b)(1), unless the authorization is terminated or revoked.     Resp Syncytial Virus by PCR NEGATIVE NEGATIVE Final    Comment: (NOTE) Fact Sheet for Patients: bloggercourse.com  Fact Sheet for Healthcare Providers: seriousbroker.it  This test is not yet approved or cleared by the [place]  FDA and has been authorized for detection and/or diagnosis of SARS-CoV-2 by FDA under an Emergency Use Authorization (EUA). This EUA will remain in effect (meaning this test can be used) for the duration of the COVID-19 declaration under Section 564(b)(1) of the Act, 21 U.S.C. section 360bbb-3(b)(1), unless the authorization is terminated or revoked.  Performed at [facility], [address] [place], [name]   Blood culture (routine x 2)     Status: None   Collection  Time: 02/08/24  9:53 PM   Specimen: BLOOD  Result Value Ref Range Status   Specimen Description BLOOD SITE NOT SPECIFIED  Final   Special Requests   Final    BOTTLES DRAWN AEROBIC AND ANAEROBIC Blood Culture results may not be optimal due to an inadequate volume of blood received in culture bottles   Culture   Final    NO GROWTH 5 DAYS Performed at [facility], [address] [place], [name]    Report Status 02/13/2024 FINAL  Final  Blood culture (routine x 2)  Status: None   Collection Time: 02/08/24 10:39 PM   Specimen: BLOOD  Result Value Ref Range Status   Specimen Description BLOOD BLOOD LEFT WRIST  Final   Special Requests   Final    BOTTLES DRAWN AEROBIC AND ANAEROBIC Blood Culture adequate volume   Culture   Final    NO GROWTH 5 DAYS Performed at [name], [address], [place], [name]    Report Status 02/13/2024 FINAL  Final  Respiratory (~20 pathogens) panel by PCR     Status: None   Collection Time: 02/09/24  2:13 PM   Specimen: Nasopharyngeal Swab; Respiratory  Result Value Ref Range Status   Adenovirus NOT DETECTED NOT DETECTED Final   Coronavirus 229E NOT DETECTED NOT DETECTED Final    Comment: (NOTE) The Coronavirus on the Respiratory Panel, DOES NOT test for the novel  Coronavirus (2019 nCoV)    Coronavirus HKU1 NOT DETECTED NOT DETECTED Final   Coronavirus NL63 NOT DETECTED NOT DETECTED Final   Coronavirus OC43 NOT DETECTED NOT DETECTED Final   Metapneumovirus NOT DETECTED NOT DETECTED Final   Rhinovirus / Enterovirus NOT DETECTED NOT DETECTED Final   Influenza A NOT DETECTED NOT DETECTED Final   Influenza B NOT DETECTED NOT DETECTED Final   Parainfluenza Virus 1 NOT DETECTED NOT DETECTED Final   Parainfluenza Virus 2 NOT DETECTED NOT DETECTED Final   Parainfluenza Virus 3 NOT DETECTED NOT DETECTED Final   Parainfluenza Virus 4 NOT DETECTED NOT DETECTED Final   Respiratory Syncytial Virus NOT DETECTED NOT  DETECTED Final   Bordetella pertussis NOT DETECTED NOT DETECTED Final   Bordetella Parapertussis NOT DETECTED NOT DETECTED Final   Chlamydophila pneumoniae NOT DETECTED NOT DETECTED Final   Mycoplasma pneumoniae NOT DETECTED NOT DETECTED Final    Comment: Performed at [facility], [address] [place], [name]         Radiology Studies: No results found.         LOS: 6 days   Time spent= 35 mins    Deliliah Room, MD [facility]  If 7PM-7AM, please contact night-coverage  02/17/2024, 9:13 AM  "

## 2024-02-17 NOTE — Plan of Care (Signed)

## 2024-02-17 NOTE — Progress Notes (Signed)
 Physical Therapy Treatment Patient Details Name: [name]. MRN: [id number] DOB: [birth year] Today's Date: 02/17/2024   History of Present Illness Pt is a 64 y.o. male who presented 02/07/24 with progressive R-sided weakness and pain along with L ear pain and drainage. Pt septic with L acute otitis media. MRI brain showed no acute findings. MRI cervical spine demonstrated no interval change from prior surgery and no clear etiology for current weakness. Imaging did note prior left-sided strokes, without evidence of acute stroke or recrudescence. Neurosurgery consulted and recommends no acute intervention. R weakness suspected recrudescence of prior symptoms in setting of severe infection. PMH includes: recurrent GIB, epilepsy, PRES, TIA, HTN, renal cell carcinoma, CVA, OSA, ESRD, DM II, Ischemic colitis, anterior cervical corpectomy C4 and C3-C5 fusion (Dr. [name], 05/25/2023), PAD s/p angioplasty/stenting right SFA and popliteal arteries 12/09/2023 for critical limb ischemia    PT Comments  Pt is progressing towards goals. Pt continues to have difficulty with R knee pain; pre-medication prior to session with pain 10/10 at rest; improved after administration of medication but continues to be painful with WB and mobility. Pt was able to stand 2x without buckling at the R knee but unable to fully WB on the R in order to clear feet to initiate gait. Due to pt current functional status, home set up and available assistance at home recommending skilled physical therapy services > 3 hours/day in order to address strength, balance and functional mobility to decrease risk for falls, injury, immobility, skin break down and re-hospitalization.      If plan is discharge home, recommend the following: Two people to help with walking and/or transfers;Two people to help with bathing/dressing/bathroom;Assistance with cooking/housework;Assist for transportation     Equipment Recommendations  BSC/3in1;Hospital  bed;Hoyer lift       Precautions / Restrictions Precautions Precautions: Fall;Other (comment) Precaution/Restrictions Comments: watch HR, R knee buckles Restrictions Weight Bearing Restrictions Per Provider Order: No     Mobility  Bed Mobility Overal bed mobility: Needs Assistance Bed Mobility: Supine to Sit, Sit to Supine     Supine to sit: Min assist, +2 for physical assistance Sit to supine: Min assist, +2 for physical assistance   General bed mobility comments: +2 assist for trunk and RLE.    Transfers Overall transfer level: Needs assistance Equipment used: Rolling walker (2 wheels) Transfers: Sit to/from Stand Sit to Stand: Mod assist, +2 physical assistance, +2 safety/equipment, From elevated surface           General transfer comment: Pt was Mod A for initial momentum to get to standing 2x from elevated EOB. Pt was able to get into full upright standing position. Pt did not require blocking at the RLE. Pt was able to wgt shift in standing but was unable to clear either foot for initiation of gait.   Modified Rankin (Stroke Patients Only) Modified Rankin (Stroke Patients Only) Pre-Morbid Rankin Score: Slight disability Modified Rankin: Moderately severe disability     Balance Overall balance assessment: Needs assistance Sitting-balance support: Feet supported Sitting balance-Leahy Scale: Fair Sitting balance - Comments: supervision sitting EOB   Standing balance support: Bilateral upper extremity supported, During functional activity, Reliant on assistive device for balance Standing balance-Leahy Scale: Poor Standing balance comment: reliant on RW and external support      Communication Communication Communication: No apparent difficulties  Cognition Arousal: Alert Behavior During Therapy: WFL for tasks assessed/performed   PT - Cognitive impairments: No apparent impairments   PT - Cognition Comments: requires extra time,  encouragement to initiate  tasks Following commands: Intact      Cueing Cueing Techniques: Verbal cues     General Comments General comments (skin integrity, edema, etc.): no signs/symptoms of cardiac/respiratory distress during session.      Pertinent Vitals/Pain Pain Assessment Pain Assessment: 0-10 Pain Score: 10-Worst pain ever Pain Location: R knee Pain Descriptors / Indicators: Discomfort, Grimacing Pain Intervention(s): Premedicated before session, Monitored during session, Limited activity within patient's tolerance, Repositioned     PT Goals (current goals can now be found in the care plan section) Acute Rehab PT Goals Patient Stated Goal: to improve PT Goal Formulation: With patient Time For Goal Achievement: 02/23/24 Potential to Achieve Goals: Fair Progress towards PT goals: Progressing toward goals    Frequency    Min 3X/week      PT Plan  Continue with current POC        AM-PAC PT 6 Clicks Mobility   Outcome Measure  Help needed turning from your back to your side while in a flat bed without using bedrails?: A Little Help needed moving from lying on your back to sitting on the side of a flat bed without using bedrails?: A Little Help needed moving to and from a bed to a chair (including a wheelchair)?: A Lot Help needed standing up from a chair using your arms (e.g., wheelchair or bedside chair)?: A Lot Help needed to walk in hospital room?: Total Help needed climbing 3-5 steps with a railing? : Total 6 Click Score: 12    End of Session Equipment Utilized During Treatment: Gait belt Activity Tolerance: Patient limited by pain;Patient tolerated treatment well Patient left: with call bell/phone within reach;in bed;with bed alarm set Nurse Communication: Mobility status PT Visit Diagnosis: Unsteadiness on feet (R26.81);Other abnormalities of gait and mobility (R26.89);Muscle weakness (generalized) (M62.81);History of falling (Z91.81);Difficulty in walking, not elsewhere  classified (R26.2);Repeated falls (R29.6);Other symptoms and signs involving the nervous system (R29.898)     Time: 8768-8741 PT Time Calculation (min) (ACUTE ONLY): 27 min  Charges:    $Therapeutic Activity: 23-37 mins PT General Charges $$ ACUTE PT VISIT: 1 Visit                    [name], DPT, CLT  Acute Rehabilitation Services Office: [phone] (Secure chat preferred)    [name] 02/17/2024, 1:37 PM

## 2024-02-17 NOTE — Plan of Care (Addendum)
 Sepsis secondary to otitis media with suspected perforated tympanic membrane Sepsis resolved Otitis media RN reported that patient's second IV site has been lost.  Received Zosyn  last night however on the assessment of the second dose the IV access is leaking.  Per IV team patient does not have any good viens for IV therapy.  Per chart review blood culture from 02/08/2024 so far negative.  So far patient received Zosyn  9 out of 10 days. -Switching IV Zosyn  to oral Augmentin  twice daily for 5 days. -Continue Ciprodex  eardrops.   Nazaire Cordial, MD [facility] 02/17/2024, 5:55 AM

## 2024-02-17 NOTE — Care Management Important Message (Signed)
 Important Message  Patient Details  Name: Carl Vanvoorhis Sr. MRN: [id number] Date of Birth: 05-02-1960   Important Message Given:  Yes - Medicare IM     Genova Kiner 02/17/2024, 2:20 PM

## 2024-02-18 ENCOUNTER — Ambulatory Visit: Attending: Family Medicine | Admitting: Physical Therapy

## 2024-02-18 DIAGNOSIS — A419 Sepsis, unspecified organism: Secondary | ICD-10-CM | POA: Diagnosis not present

## 2024-02-18 LAB — CBC WITH DIFFERENTIAL/PLATELET
Abs Immature Granulocytes: 0.07 K/uL (ref 0.00–0.07)
Basophils Absolute: 0.1 K/uL (ref 0.0–0.1)
Basophils Relative: 1 %
Eosinophils Absolute: 0 K/uL (ref 0.0–0.5)
Eosinophils Relative: 0 %
HCT: 30.2 % — ABNORMAL LOW (ref 39.0–52.0)
Hemoglobin: 9.9 g/dL — ABNORMAL LOW (ref 13.0–17.0)
Immature Granulocytes: 1 %
Lymphocytes Relative: 6 %
Lymphs Abs: 0.6 K/uL — ABNORMAL LOW (ref 0.7–4.0)
MCH: 29 pg (ref 26.0–34.0)
MCHC: 32.8 g/dL (ref 30.0–36.0)
MCV: 88.6 fL (ref 80.0–100.0)
Monocytes Absolute: 1 K/uL (ref 0.1–1.0)
Monocytes Relative: 11 %
Neutro Abs: 7.2 K/uL (ref 1.7–7.7)
Neutrophils Relative %: 81 %
Platelets: 501 K/uL — ABNORMAL HIGH (ref 150–400)
RBC: 3.41 MIL/uL — ABNORMAL LOW (ref 4.22–5.81)
RDW: 15.6 % — ABNORMAL HIGH (ref 11.5–15.5)
WBC: 9 K/uL (ref 4.0–10.5)
nRBC: 0 % (ref 0.0–0.2)

## 2024-02-18 LAB — RENAL FUNCTION PANEL
Albumin: 3.2 g/dL — ABNORMAL LOW (ref 3.5–5.0)
Anion gap: 17 — ABNORMAL HIGH (ref 5–15)
BUN: 49 mg/dL — ABNORMAL HIGH (ref 8–23)
CO2: 27 mmol/L (ref 22–32)
Calcium: 9.2 mg/dL (ref 8.9–10.3)
Chloride: 89 mmol/L — ABNORMAL LOW (ref 98–111)
Creatinine, Ser: 7.91 mg/dL — ABNORMAL HIGH (ref 0.61–1.24)
GFR, Estimated: 7 mL/min — ABNORMAL LOW
Glucose, Bld: 112 mg/dL — ABNORMAL HIGH (ref 70–99)
Phosphorus: 5.2 mg/dL — ABNORMAL HIGH (ref 2.5–4.6)
Potassium: 5.2 mmol/L — ABNORMAL HIGH (ref 3.5–5.1)
Sodium: 133 mmol/L — ABNORMAL LOW (ref 135–145)

## 2024-02-18 LAB — SYNOVIAL CELL COUNT + DIFF, W/ CRYSTALS
Crystals, Fluid: NONE SEEN
Eosinophils-Synovial: 0 % (ref 0–1)
Lymphocytes-Synovial Fld: 1 % (ref 0–20)
Monocyte-Macrophage-Synovial Fluid: 4 % — ABNORMAL LOW (ref 50–90)
Neutrophil, Synovial: 94 % — ABNORMAL HIGH (ref 0–25)
Other Cells-SYN: 1
WBC, Synovial: 71000 /mm3 — ABNORMAL HIGH (ref 0–200)

## 2024-02-18 LAB — LACTIC ACID, PLASMA: Lactic Acid, Venous: 1.2 mmol/L (ref 0.5–1.9)

## 2024-02-18 LAB — URIC ACID: Uric Acid, Serum: 4.2 mg/dL (ref 3.7–8.6)

## 2024-02-18 MED ORDER — BUPIVACAINE HCL (PF) 0.5 % IJ SOLN
10.0000 mL | Freq: Once | INTRAMUSCULAR | Status: DC
  Filled 2024-02-18: qty 10

## 2024-02-18 MED ORDER — LIDOCAINE 5 % EX PTCH
1.0000 | MEDICATED_PATCH | CUTANEOUS | Status: DC
  Administered 2024-02-21 – 2024-03-02 (×6): 1 via TRANSDERMAL
  Filled 2024-02-18 (×5): qty 1

## 2024-02-18 MED ORDER — HEPARIN SODIUM (PORCINE) 1000 UNIT/ML IJ SOLN
INTRAMUSCULAR | Status: AC
  Filled 2024-02-18: qty 6

## 2024-02-18 MED ORDER — CHLORHEXIDINE GLUCONATE CLOTH 2 % EX PADS
6.0000 | MEDICATED_PAD | Freq: Every day | CUTANEOUS | Status: DC
  Administered 2024-02-19 – 2024-02-20 (×2): 6 via TOPICAL

## 2024-02-18 NOTE — Plan of Care (Signed)

## 2024-02-18 NOTE — Progress Notes (Addendum)
 " PROGRESS NOTE    Carl Shukla Sr.  FMW:[id number] DOB: March 07, 1960 DOA: 02/08/2024 PCP: Carl Slater, MD   Brief Narrative:   64 y.o. M with ESRD on HD MWF, PVD s/p angioplasty R SFA Oct 2025 for ischemic rest pain as well as C4 spinal fracture in Apr 2025 with incomplete spinal cord injury requiring C4 corpectomy, fusion and interbody cage by [name], wheelchair bound for a time, but progressed by August to walking with a cane and driving(?) who presented with right arm and leg weakness, falls, as well as posterior neck pain, fever, and ear drainage.   Admitted on antibiotics. Neurosurgery, Neurology, Orthopedics, ENT, and Nephrology consulted.   Ultimately diagnosed with otitis media and ruptured ear drum.  Right sided weakness believed to be recrudescence of old spinal cord injury symptoms in setting of infection.  Denied CIR placement by his insurance.  Assessment & Plan:  Principal Problem:   Sepsis (HCC) Active Problems:   GERD (gastroesophageal reflux disease)   HLD (hyperlipidemia)   Essential hypertension   Diarrhea, unspecified   ESRD (end stage renal disease) (HCC)   Obstructive sleep apnea   Type 2 diabetes mellitus with diabetic peripheral angiopathy without gangrene (HCC)   Acute suppurative otitis media of left ear with spontaneous rupture of tympanic membrane   Mastoid disorder, bilateral   Conductive hearing loss   Acute right-sided weakness    Sepsis due to postoperative otitis media with suspected TM perforation Patient was admitted with sepsis present on admission due to suspected suppurative otitis media.  COVID, flu, RVP negative.  Blood cultures no growth.   ENT [name] was consulted, and patient underwent CT of the temporal bones without evidence of coalescent mastoiditis, bone breakdown, or abscess. - Dced zosyn  on 1/4 and transitioned to oral augmentin . Patient lost his IV. - Continue Ciprodex  drops - Needs ENT follow up with [name] on or  around Jan 5-12 -Temp of 100.5 this morning with tachycardia. Ordered CBC and lactic acid to be checked.     Right-sided weakness History of cervical decompression/corpectomy C4 by [name] April 2025 Remote left occipital infarct Admitted and given concern for recurrent spinal cord injury, neurosurgery and neurology were consulted.  Spinal MR shows no new cord injury.  Neurosurgery and Neurology involved, feel that this is recrudescence of old symptoms due to infection. - PT OT - Outpatient Neurosurgery follow up recommended    Right shoulder pain, history of right reverse shoulder arthroplasty Right knee tricompartmental osteoarthritis with moderate joint effusion - He was seen by Ortho PA on 12/30 -Discussed with [name] PA on 1/6 as his right knee is swollen, tender and limited range of motrin -f/u Uric acid level   Peripheral vascular disease status post angioplasty October 2025 -Continue aspirin , Lipitor, Plavix   ESRD, on HD: MWF Nephrology on board HD done early this morning.   Chronic hypotension,asymptomatic -Continue midodrine    Diabetes mellitus type 2 with neuropathy: Glucose controlled off medication - Continue gabapentin    Hyponatremia Mild stable   Anemia of chronic kidney disease Mild stable  Disposition: Insurance denied CIR placement. He was living with his son prior to this hospitalization  DVT prophylaxis: heparin  injection 5,000 Units Start: 02/09/24 0600     Code Status: Full Code Family Communication: None at the bedside   Status is: Inpatient Remains inpatient appropriate because: Pending placement    Subjective:  No acute issues overnight. HD done early this morning without any complications.  Examination:  General exam:  Appears calm and comfortable  Respiratory system: Clear to auscultation. Respiratory effort normal. Cardiovascular system: S1 & S2 heard, RRR. No JVD, murmurs, rubs, gallops or clicks. No pedal  edema. Gastrointestinal system: Abdomen is nondistended, soft and nontender. No organomegaly or masses felt. Normal bowel sounds heard. Central nervous system: Alert and oriented. No focal neurological deficits. Extremities: Right sided is weaker than the left, right knee is swollen, hot and tender Skin: No rashes, lesions or ulcers      Diet Orders (From admission, onward)     Start     Ordered   02/09/24 0442  Diet renal with fluid restriction Fluid restriction: 1200 mL Fluid; Room service appropriate? Yes; Fluid consistency: Thin  Diet effective now       Question Answer Comment  Fluid restriction: 1200 mL Fluid   Room service appropriate? Yes   Fluid consistency: Thin      02/09/24 0441            Objective: Vitals:   02/18/24 0808 02/18/24 0809 02/18/24 0858 02/18/24 0915  BP: 136/85 132/82 106/73   Pulse: (!) 113 (!) 111 (!) 116   Resp: 13 13 19    Temp:  99.1 F (37.3 C) (!) 100.5 F (38.1 C) 99.5 F (37.5 C)  TempSrc:      SpO2: 97% 98% 98%   Weight:      Height:        Intake/Output Summary (Last 24 hours) at 02/18/2024 1025 Last data filed at 02/18/2024 0809 Gross per 24 hour  Intake --  Output 1600 ml  Net -1600 ml    Filed Weights   02/14/24 1119 02/16/24 0404 02/17/24 0500  Weight: 84.2 kg 84.2 kg 86.1 kg    Scheduled Meds:  (feeding supplement) PROSource Plus  30 mL Oral BID BM   amoxicillin -clavulanate  1 tablet Oral Q12H   aspirin  EC  81 mg Oral Daily   atorvastatin   10 mg Oral Daily   Chlorhexidine  Gluconate Cloth  6 each Topical Q0600   Chlorhexidine  Gluconate Cloth  6 each Topical Q0600   cinacalcet   120 mg Oral Q breakfast   ciprofloxacin -dexamethasone   4 drop Left EAR BID   clopidogrel   75 mg Oral Daily   darbepoetin (ARANESP ) injection - DIALYSIS  40 mcg Subcutaneous Q Fri-1800   gabapentin   300 mg Oral QHS   heparin   5,000 Units Subcutaneous Q8H   insulin  aspart  0-5 Units Subcutaneous QHS   insulin  aspart  0-6 Units Subcutaneous  TID WC   lidocaine   1 patch Transdermal Q24H   midodrine   10 mg Oral TID WC   multivitamin  1 tablet Oral QHS   pantoprazole   40 mg Oral BID AC   psyllium  1 packet Oral Daily   sevelamer  carbonate  2,400 mg Oral TID WC   sodium chloride  flush  3 mL Intravenous Q12H   Continuous Infusions:    Nutritional status Signs/Symptoms: estimated needs Interventions: Refer to RD note for recommendations, MVI Body mass index is 25.04 kg/m.  Data Reviewed:   CBC: Recent Labs  Lab 02/12/24 1844 02/13/24 0746  WBC 10.0 9.1  NEUTROABS 7.0  --   HGB 9.3* 10.0*  HCT 28.0* 29.8*  MCV 88.9 87.6  PLT 339 353   Basic Metabolic Panel: Recent Labs  Lab 02/13/24 0746 02/14/24 0138 02/15/24 0531 02/16/24 0545 02/17/24 0447  NA 133* 134* 133* 128* 129*  K 4.3 4.3 4.7 4.2 5.6*  CL 91* 90* 89* 68* 86*  CO2 27 29 27  19* 23  GLUCOSE 93 101* 89 376* 80  BUN 37* 53* 44* 53* 80*  CREATININE 5.90* 7.76* 7.03* 7.19* 11.00*  CALCIUM  9.8 9.6 9.8 7.8* 9.2  PHOS 3.5 5.1* 4.7* 4.1 5.8*   GFR: Estimated Creatinine Clearance: 7.6 mL/min (A) (by C-G formula based on SCr of 11 mg/dL (H)). Liver Function Tests: Recent Labs  Lab 02/13/24 0746 02/14/24 0138 02/15/24 0531 02/16/24 0545 02/17/24 0447  ALBUMIN  3.1* 3.1* 3.2* 2.7* 2.9*   No results for input(s): LIPASE, AMYLASE in the last 168 hours. No results for input(s): AMMONIA in the last 168 hours. Coagulation Profile: No results for input(s): INR, PROTIME in the last 168 hours. Cardiac Enzymes: No results for input(s): CKTOTAL, CKMB, CKMBINDEX, TROPONINI in the last 168 hours. BNP (last 3 results) No results for input(s): PROBNP in the last 8760 hours. HbA1C: No results for input(s): HGBA1C in the last 72 hours. CBG: Recent Labs  Lab 02/16/24 1548 02/16/24 2124 02/17/24 2127  GLUCAP 108* 103* 92   Lipid Profile: No results for input(s): CHOL, HDL, LDLCALC, TRIG, CHOLHDL, LDLDIRECT in the last  72 hours. Thyroid  Function Tests: No results for input(s): TSH, T4TOTAL, FREET4, T3FREE, THYROIDAB in the last 72 hours. Anemia Panel: No results for input(s): VITAMINB12, FOLATE, FERRITIN, TIBC, IRON, RETICCTPCT in the last 72 hours. Sepsis Labs: No results for input(s): PROCALCITON, LATICACIDVEN in the last 168 hours.  Recent Results (from the past 240 hours)  Resp panel by RT-PCR (RSV, Flu A&B, Covid) Anterior Nasal Swab     Status: None   Collection Time: 02/08/24 12:01 PM   Specimen: Anterior Nasal Swab  Result Value Ref Range Status   SARS Coronavirus 2 by RT PCR NEGATIVE NEGATIVE Final   Influenza A by PCR NEGATIVE NEGATIVE Final   Influenza B by PCR NEGATIVE NEGATIVE Final    Comment: (NOTE) The Xpert Xpress SARS-CoV-2/FLU/RSV plus assay is intended as an aid in the diagnosis of influenza from Nasopharyngeal swab specimens and should not be used as a sole basis for treatment. Nasal washings and aspirates are unacceptable for Xpert Xpress SARS-CoV-2/FLU/RSV testing.  Fact Sheet for Patients: bloggercourse.com  Fact Sheet for Healthcare Providers: seriousbroker.it  This test is not yet approved or cleared by the [place]  FDA and has been authorized for detection and/or diagnosis of SARS-CoV-2 by FDA under an Emergency Use Authorization (EUA). This EUA will remain in effect (meaning this test can be used) for the duration of the COVID-19 declaration under Section 564(b)(1) of the Act, 21 U.S.C. section 360bbb-3(b)(1), unless the authorization is terminated or revoked.     Resp Syncytial Virus by PCR NEGATIVE NEGATIVE Final    Comment: (NOTE) Fact Sheet for Patients: bloggercourse.com  Fact Sheet for Healthcare Providers: seriousbroker.it  This test is not yet approved or cleared by the [place]  FDA and has been authorized for  detection and/or diagnosis of SARS-CoV-2 by FDA under an Emergency Use Authorization (EUA). This EUA will remain in effect (meaning this test can be used) for the duration of the COVID-19 declaration under Section 564(b)(1) of the Act, 21 U.S.C. section 360bbb-3(b)(1), unless the authorization is terminated or revoked.  Performed at [facility], [address] [place], [name]   Blood culture (routine x 2)     Status: None   Collection Time: 02/08/24  9:53 PM   Specimen: BLOOD  Result Value Ref Range Status   Specimen Description BLOOD SITE NOT SPECIFIED  Final  Special Requests   Final    BOTTLES DRAWN AEROBIC AND ANAEROBIC Blood Culture results may not be optimal due to an inadequate volume of blood received in culture bottles   Culture   Final    NO GROWTH 5 DAYS Performed at [facility], [address] [place], [name]    Report Status 02/13/2024 FINAL  Final  Blood culture (routine x 2)     Status: None   Collection Time: 02/08/24 10:39 PM   Specimen: BLOOD  Result Value Ref Range Status   Specimen Description BLOOD BLOOD LEFT WRIST  Final   Special Requests   Final    BOTTLES DRAWN AEROBIC AND ANAEROBIC Blood Culture adequate volume   Culture   Final    NO GROWTH 5 DAYS Performed at [facility], [address] [place], [name]    Report Status 02/13/2024 FINAL  Final  Respiratory (~20 pathogens) panel by PCR     Status: None   Collection Time: 02/09/24  2:13 PM   Specimen: Nasopharyngeal Swab; Respiratory  Result Value Ref Range Status   Adenovirus NOT DETECTED NOT DETECTED Final   Coronavirus 229E NOT DETECTED NOT DETECTED Final    Comment: (NOTE) The Coronavirus on the Respiratory Panel, DOES NOT test for the novel  Coronavirus (2019 nCoV)    Coronavirus HKU1 NOT DETECTED NOT DETECTED Final   Coronavirus NL63 NOT DETECTED NOT DETECTED Final   Coronavirus OC43 NOT DETECTED NOT DETECTED Final   Metapneumovirus  NOT DETECTED NOT DETECTED Final   Rhinovirus / Enterovirus NOT DETECTED NOT DETECTED Final   Influenza A NOT DETECTED NOT DETECTED Final   Influenza B NOT DETECTED NOT DETECTED Final   Parainfluenza Virus 1 NOT DETECTED NOT DETECTED Final   Parainfluenza Virus 2 NOT DETECTED NOT DETECTED Final   Parainfluenza Virus 3 NOT DETECTED NOT DETECTED Final   Parainfluenza Virus 4 NOT DETECTED NOT DETECTED Final   Respiratory Syncytial Virus NOT DETECTED NOT DETECTED Final   Bordetella pertussis NOT DETECTED NOT DETECTED Final   Bordetella Parapertussis NOT DETECTED NOT DETECTED Final   Chlamydophila pneumoniae NOT DETECTED NOT DETECTED Final   Mycoplasma pneumoniae NOT DETECTED NOT DETECTED Final    Comment: Performed at [facility], [address], [place], [name]         Radiology Studies: No results found.         LOS: 7 days   Time spent= 35 mins    Deliliah Room, MD [facility]  If 7PM-7AM, please contact night-coverage  02/18/2024, 10:25 AM  "

## 2024-02-18 NOTE — [place] FL2 (Signed)
 " [place]  MEDICAID FL2 LEVEL OF CARE FORM     IDENTIFICATION  Patient Name: Carl Daft Sr. Birthdate: Nov 11, 1960 Sex: male Admission Date (Current Location): 02/08/2024  [facility] and [place] Number:  Producer, Television/film/video and Address:  [facility] [place], [address], [place], [name]      Provider Number: [id number]  Attending Physician Name and Address:  Dino Antu, MD  Relative Name and Phone Number:  Delaney Schnick: [phone]    Current Level of Care: Hospital Recommended Level of Care: Skilled Nursing Facility Prior Approval Number:    Date Approved/Denied:   PASRR Number: [id number]  Discharge Plan: SNF    Current Diagnoses: Patient Active Problem List   Diagnosis Date Noted   Acute right-sided weakness 02/11/2024   Acute suppurative otitis media of left ear with spontaneous rupture of tympanic membrane 02/09/2024   Mastoid disorder, bilateral 02/09/2024   Conductive hearing loss 02/09/2024   Sepsis (HCC) 02/08/2024   Right knee buckling 11/15/2023   Primary osteoarthritis of right knee 11/15/2023   Abnormality of gait 11/15/2023   Primary osteoarthritis of left knee 10/03/2023   Right hip pain 07/14/2023   Coping style affecting medical condition 06/05/2023   Spinal cord injury, cervical region, sequela 05/30/2023   Cervical vertebral fracture (HCC) 05/25/2023   History of GI bleed 05/25/2023   C4 cervical fracture (HCC) 05/25/2023   Duodenitis    Gastritis and gastroduodenitis    Ischemic colitis    Bright red blood per rectum    ABLA (acute blood loss anemia)    Gastric ulcer    Duodenal ulcer    Diverticulosis of colon without hemorrhage    GI bleed 09/17/2021   Acute upper GI bleeding 09/11/2021   Mucosal abnormality of esophagus    Mucosal abnormality of stomach    Mucosal abnormality of duodenum    Upper GI bleed    Left hip pain 11/16/2020   Type 2 diabetes mellitus with other specified  complication (HCC) 11/14/2020   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/14/2020   Primary osteoarthritis of left hip 10/07/2020   Status post reverse total arthroplasty of right shoulder 08/24/2020   Diverticulitis 03/08/2020   ESRD (end stage renal disease) (HCC) 03/08/2020   Polyneuropathy in diseases classified elsewhere 03/08/2020   Pure hypercholesterolemia 03/08/2020   Sacral back pain 03/08/2020   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 11/10/2019   Acute gastric ulcer with hemorrhage    Acute GI bleeding 07/31/2019   Hypotension of hemodialysis 07/31/2019   Diverticulitis large intestine 07/31/2019   Mass of soft tissue of shoulder 05/26/2019   Awaiting organ transplant status 03/18/2019   Finding of cocaine in blood 02/25/2019   Anaphylactic shock, unspecified, sequela 11/21/2018   Hypercalcemia 10/28/2018   Diarrhea, unspecified 08/08/2018   Hyperkalemia 03/12/2018   Renal cell carcinoma of left kidney (HCC) 12/24/2017   Fluid overload, unspecified 07/06/2017   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   Other specified disorders of kidney and ureter 11/22/2016   Secondary hyperparathyroidism of renal origin 10/23/2016   Headache disorder 08/21/2016   TIA (transient ischemic attack)    Essential hypertension    Seizure (HCC)    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    Lung nodule    Diverticulitis of cecum 07/21/2015   Diverticulitis of large intestine without perforation or abscess without bleeding    Right lower quadrant pain    Obstructive sleep apnea 02/08/2015   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   Diverticulosis 07/13/2014   Lower GI bleed 07/12/2014   Refusal of blood transfusions as patient is Jehovah's Witness    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   HLD (hyperlipidemia)    PRES (posterior reversible encephalopathy syndrome)    History of CVA (cerebrovascular accident)  05/23/2014   Anemia of chronic disease 05/23/2014   GERD (gastroesophageal reflux disease) 12/09/2012   Gastrointestinal bleeding 11/12/2012   Melena 11/12/2012   Other psychoactive substance dependence, uncomplicated (HCC) 10/08/2012   Iron deficiency anemia, unspecified 06/10/2012   Moderate protein-calorie malnutrition 07/29/2009   Coagulation defect, unspecified 08/29/2006   Type 2 diabetes mellitus with diabetic peripheral angiopathy without gangrene (HCC) 08/29/2006   Hypertensive chronic kidney disease with stage 5 chronic kidney disease or end stage renal disease (HCC) 08/02/2006    Orientation RESPIRATION BLADDER Height & Weight     Self, Time, Situation, Place  Normal Continent Weight: 189 lb 13.1 oz (86.1 kg) Height:  6' 1 (185.4 cm)  BEHAVIORAL SYMPTOMS/MOOD NEUROLOGICAL BOWEL NUTRITION STATUS      Continent Diet (please refer to dc summary)  AMBULATORY STATUS COMMUNICATION OF NEEDS Skin   Limited Assist Verbally Normal                       Personal Care Assistance Level of Assistance  Bathing, Feeding, Dressing Bathing Assistance: Limited assistance Feeding assistance: Independent Dressing Assistance: Limited assistance     Functional Limitations Info  Hearing, Sight, Speech Sight Info: Adequate Hearing Info: Adequate Speech Info: Adequate    SPECIAL CARE FACTORS FREQUENCY  PT (By licensed PT), OT (By licensed OT)     PT Frequency: 5x/week OT Frequency: 5x/week            Contractures Contractures Info: Not present    Additional Factors Info  Code Status, Allergies Code Status Info: FULL Allergies Info: Inred, vicodin, hydrocodone , voltaren           Current Medications (02/18/2024):  This is the current hospital active medication list Current Facility-Administered Medications  Medication Dose Route Frequency Provider Last Rate Last Admin   (feeding supplement) PROSource Plus liquid 30 mL  30 mL Oral BID BM Stovall, Kathryn R, PA-C   30  mL at 02/18/24 0920   acetaminophen  (TYLENOL ) tablet 650 mg  650 mg Oral Q6H PRN Bryn Bernardino NOVAK, MD   650 mg at 02/17/24 0036   Or   acetaminophen  (TYLENOL ) suppository 650 mg  650 mg Rectal Q6H PRN Bryn Bernardino NOVAK, MD       amoxicillin -clavulanate (AUGMENTIN ) 500-125 MG per tablet 1 tablet  1 tablet Oral Q12H Sundil, Subrina, MD   1 tablet at 02/18/24 0918   aspirin  EC tablet 81 mg  81 mg Oral Daily Bryn Bernardino NOVAK, MD   81 mg at 02/18/24 0919   atorvastatin  (LIPITOR) tablet 10 mg  10 mg Oral Daily Grunz, Ryan B, MD   10 mg at 02/18/24 9081   Chlorhexidine  Gluconate Cloth 2 % PADS 6 each  6 each Topical Q0600 Collins, Samantha G, PA-C   6 each at 02/17/24 0551   Chlorhexidine  Gluconate Cloth 2 % PADS 6 each  6 each Topical Q0600 Geralynn Charleston, MD   6 each at 02/17/24 0551   cinacalcet  (SENSIPAR ) tablet 120 mg  120 mg Oral Q breakfast Stovall, Kathryn R, PA-C   120 mg at 02/18/24 0919   ciprofloxacin -dexamethasone  (CIPRODEX ) 0.3-0.1 % OTIC (EAR)  suspension 4 drop  4 drop Left EAR BID Guillermina Hamilton, MD   4 drop at 02/18/24 0920   clopidogrel  (PLAVIX ) tablet 75 mg  75 mg Oral Daily Bryn Bernardino NOVAK, MD   75 mg at 02/18/24 9080   cyclobenzaprine  (FLEXERIL ) tablet 10 mg  10 mg Oral TID PRN Christobal Guadalajara, MD   10 mg at 02/14/24 0544   Darbepoetin Alfa  (ARANESP ) injection 40 mcg  40 mcg Subcutaneous Q Fri-1800 Collins, Samantha G, PA-C   40 mcg at 02/14/24 2114   diphenhydrAMINE  (BENADRYL ) capsule 25 mg  25 mg Oral Q6H PRN Christobal Guadalajara, MD       gabapentin  (NEURONTIN ) capsule 300 mg  300 mg Oral QHS Kc, Guadalajara, MD   300 mg at 02/17/24 2235   heparin  injection 5,000 Units  5,000 Units Subcutaneous Q8H Bryn Bernardino NOVAK, MD   5,000 Units at 02/17/24 2235   insulin  aspart (novoLOG ) injection 0-5 Units  0-5 Units Subcutaneous QHS Rashid, Farhan, MD       insulin  aspart (novoLOG ) injection 0-6 Units  0-6 Units Subcutaneous TID WC Rashid, Farhan, MD       lidocaine  (LIDODERM ) 5 % 1 patch  1 patch Transdermal Q24H Kc,  Ramesh, MD   1 patch at 02/17/24 1435   melatonin tablet 5 mg  5 mg Oral QHS PRN Christobal Guadalajara, MD   5 mg at 02/09/24 2125   midodrine  (PROAMATINE ) tablet 10 mg  10 mg Oral PRN Stovall, Kathryn R, PA-C   10 mg at 02/12/24 0036   midodrine  (PROAMATINE ) tablet 10 mg  10 mg Oral TID WC Christobal Guadalajara, MD   10 mg at 02/18/24 9081   multivitamin (RENA-VIT) tablet 1 tablet  1 tablet Oral QHS Stovall, Kathryn R, PA-C   1 tablet at 02/17/24 2235   oxyCODONE -acetaminophen  (PERCOCET/ROXICET) 5-325 MG per tablet 1 tablet  1 tablet Oral Q4H PRN Jonel Lonni SQUIBB, MD   1 tablet at 02/18/24 0919   pantoprazole  (PROTONIX ) EC tablet 40 mg  40 mg Oral BID AC Kc, Ramesh, MD   40 mg at 02/18/24 0918   psyllium (HYDROCIL/METAMUCIL) 1 packet  1 packet Oral Daily Guillermina Hamilton, MD   1 packet at 02/18/24 0919   sevelamer  carbonate (RENVELA ) tablet 2,400 mg  2,400 mg Oral TID WC Foster, Lori C, MD   2,400 mg at 02/18/24 9081   sodium chloride  flush (NS) 0.9 % injection 3 mL  3 mL Intravenous Q12H Bryn Bernardino NOVAK, MD   3 mL at 02/16/24 2147     Discharge Medications: Please see discharge summary for a list of discharge medications.  Relevant Imaging Results:  Relevant Lab Results:   Additional Information SSN:[id number]  [facility]  Sherline Clack, LCSWA     "

## 2024-02-18 NOTE — Plan of Care (Signed)
 Patient A&O X4, family at bedside during visiting hours. Bandage on right lower leg soiled, new bandage placed. Medications tolerated well. Patient left for dialysis 0320.  Problem: Education: Goal: Knowledge of General Education information will improve Description: Including pain rating scale, medication(s)/side effects and non-pharmacologic comfort measures Outcome: Progressing   Problem: Health Behavior/Discharge Planning: Goal: Ability to manage health-related needs will improve Outcome: Progressing   Problem: Clinical Measurements: Goal: Ability to maintain clinical measurements within normal limits will improve Outcome: Progressing   Problem: Coping: Goal: Level of anxiety will decrease Outcome: Progressing   Problem: Pain Managment: Goal: General experience of comfort will improve and/or be controlled Outcome: Progressing   Problem: Skin Integrity: Goal: Risk for impaired skin integrity will decrease Outcome: Progressing

## 2024-02-18 NOTE — Progress Notes (Signed)
 " [facility] Progress Note   Subjective:    Seen in room No c/o's  Presentation summary:  64 y.o. male with ESRD on HD, HTN, PAD, h/o CVA, h/o cervical spine fusion in April 2025. He is admitted with new onset right sided weakness. Symptoms started about a week ago. Unable move right leg and has been falling at home. No acute CVA on brain MRI. Cervical spine MRI demonstrates residual mild stenosis C3-4. Neurosurgery consulted. Labs Na 134, K 5.2, BUN 61, WBC 12.1, Hgb 10.1. Febrile on arrival temp 102.1. Blood cultures collected. Receiving antibiotics for suspected otitis media. Presented with ear pain and drainage. Seen and examined in the ED. Progressive right sided weakness. Difficult to lift right arm, leg. He says had similar symptoms when he needed surgery in April. Denies chest pain, sob, nausea/vomiting.  HD MWF. Last dialysis Friday. He usually drives himself but son had to drive d/t weakness. He did complete full treatment. Meeting dry weight. Would be due for dialysis today per his OP clinic holiday schedule.    Objective Vitals:   02/18/24 0809 02/18/24 0858 02/18/24 0915 02/18/24 1238  BP: 132/82 106/73  (!) 94/59  Pulse: (!) 111 (!) 116  (!) 101  Resp: 13 19  20   Temp: 99.1 F (37.3 C) (!) 100.5 F (38.1 C) 99.5 F (37.5 C) 99.7 F (37.6 C)  TempSrc:    Oral  SpO2: 98% 98%  96%  Weight:      Height:       Physical Exam General: alert male in NAD Heart: RRR, no murmur Lungs: CTA bilaterally Abdomen: soft, non-distended Extremities: no edema b/l lower extremities Dialysis Access:  AVF+ bruit  Dialysis Orders: MWF NW 4h  B400  87.3kg  2K bath  AVF   Heparin  3000 ESA: Mircera 50 q 2 wks (last 12/15) BMM: Calcitriol  2 mcg PO q HD, cinacalcet  120 mg daily   Assessment/Plan: ESRD: on HD MWF. HD Wednesday Volume: looks euvolemic on exam. Just under dry wt, lower dry wt on dc.  Hypotension: BP chronically low end - continue midodrine  10mg  with HD.  Declines blood products/albumin . Anemia of ESRD: ESA darbe 40mcg sq weekly  R sided weakness. Hx cervical spine injury/fusion. No acute CVA on MRI, does have significant DDD but no cord compression and no surgical recs per neurosurgery note. Turned down by CIR. Mastoiditis/ sepsis: w/ ear pain. Blood Cx negative. On IV Zosyn . Sepsis present on admission felt to be due to suppurative otitis media.  Metabolic bone disease. CorrCa high, continue sevelamer  binder + sensipar , calcitriol  on hold. Nutrition: Alb low, continue supps and renal MVI.  Dispo: not clear at this time   Myer Fret  MD  [facility] 02/18/2024, 4:17 PM  Recent Labs  Lab 02/12/24 1844 02/13/24 0746 02/14/24 0138 02/16/24 0545 02/17/24 0447  HGB 9.3* 10.0*  --   --   --   ALBUMIN   --  3.1*   < > 2.7* 2.9*  CALCIUM   --  9.8   < > 7.8* 9.2  PHOS  --  3.5   < > 4.1 5.8*  CREATININE  --  5.90*   < > 7.19* 11.00*  K  --  4.3   < > 4.2 5.6*   < > = values in this interval not displayed.    Inpatient medications:  (feeding supplement) PROSource Plus  30 mL Oral BID BM   amoxicillin -clavulanate  1 tablet Oral Q12H   aspirin  EC  81 mg  Oral Daily   atorvastatin   10 mg Oral Daily   bupivacaine (PF)  10 mL Infiltration Once   Chlorhexidine  Gluconate Cloth  6 each Topical Q0600   Chlorhexidine  Gluconate Cloth  6 each Topical Q0600   cinacalcet   120 mg Oral Q breakfast   ciprofloxacin -dexamethasone   4 drop Left EAR BID   clopidogrel   75 mg Oral Daily   darbepoetin (ARANESP ) injection - DIALYSIS  40 mcg Subcutaneous Q Fri-1800   gabapentin   300 mg Oral QHS   heparin   5,000 Units Subcutaneous Q8H   insulin  aspart  0-5 Units Subcutaneous QHS   insulin  aspart  0-6 Units Subcutaneous TID WC   lidocaine   1 patch Transdermal Q24H   midodrine   10 mg Oral TID WC   multivitamin  1 tablet Oral QHS   pantoprazole   40 mg Oral BID AC   psyllium  1 packet Oral Daily   sevelamer  carbonate  2,400 mg Oral TID WC   sodium chloride  flush  3 mL  Intravenous Q12H     acetaminophen  **OR** acetaminophen , cyclobenzaprine , diphenhydrAMINE , melatonin, midodrine , oxyCODONE -acetaminophen        "

## 2024-02-18 NOTE — TOC Initial Note (Signed)
 Transition of Care (TOC) - Initial/Assessment Note    Patient Details  Name: Carl Basaldua Sr. MRN: [id number] Date of Birth: 1960-09-23  Transition of Care [facility]) CM/SW Contact:    Sherline Clack, LCSWA Phone Number: 02/18/2024, 11:11 AM  Clinical Narrative:                  CSW discussed SNF option for discharge with patient at bedside. Patient agreeable to SNF. CSW will fill out FL2 for patient and send out in the HUB. Per patient, he receives outpatient therapy twice a week at [facility]. CSW will present facility list once patient has bed offers.   During conversation with CSW, patient posed questions about his inability to move his right leg; CSW will reach out to provider with patient's questions.   CSW will continue to follow and update DC plan.   Expected Discharge Plan: Skilled Nursing Facility Barriers to Discharge: SNF Pending bed offer   Patient Goals and CMS Choice     Choice offered to / list presented to : Patient      Expected Discharge Plan and Services       Living arrangements for the past 2 months: Single Family Home                                      Prior Living Arrangements/Services Living arrangements for the past 2 months: Single Family Home Lives with:: Self Patient language and need for interpreter reviewed:: Yes                 Activities of Daily Living   ADL Screening (condition at time of admission) Independently performs ADLs?: No Does the patient have a NEW difficulty with bathing/dressing/toileting/self-feeding that is expected to last >3 days?: Yes (Initiates electronic notice to provider for possible OT consult) Does the patient have a NEW difficulty with getting in/out of bed, walking, or climbing stairs that is expected to last >3 days?: Yes (Initiates electronic notice to provider for possible PT consult) Does the patient have a NEW difficulty with communication that is expected to last >3 days?:  Yes (Initiates electronic notice to provider for possible SLP consult) Is the patient deaf or have difficulty hearing?: No Does the patient have difficulty seeing, even when wearing glasses/contacts?: No Does the patient have difficulty concentrating, remembering, or making decisions?: No  Permission Sought/Granted                  Emotional Assessment Appearance:: Appears stated age Attitude/Demeanor/Rapport: Engaged Affect (typically observed): Appropriate Orientation: : Oriented to Self, Oriented to Place, Oriented to  Time, Oriented to Situation      Admission diagnosis:  Sepsis (HCC) [A41.9] Weakness of right lower extremity [R29.898] Acute suppurative otitis media of left ear with spontaneous rupture of tympanic membrane, recurrence not specified [H66.012] Patient Active Problem List   Diagnosis Date Noted   Acute right-sided weakness 02/11/2024   Acute suppurative otitis media of left ear with spontaneous rupture of tympanic membrane 02/09/2024   Mastoid disorder, bilateral 02/09/2024   Conductive hearing loss 02/09/2024   Sepsis (HCC) 02/08/2024   Right knee buckling 11/15/2023   Primary osteoarthritis of right knee 11/15/2023   Abnormality of gait 11/15/2023   Primary osteoarthritis of left knee 10/03/2023   Right hip pain 07/14/2023   Coping style affecting medical condition 06/05/2023   Spinal cord injury, cervical region, sequela 05/30/2023  Cervical vertebral fracture (HCC) 05/25/2023   History of GI bleed 05/25/2023   C4 cervical fracture (HCC) 05/25/2023   Duodenitis    Gastritis and gastroduodenitis    Ischemic colitis    Bright red blood per rectum    ABLA (acute blood loss anemia)    Gastric ulcer    Duodenal ulcer    Diverticulosis of colon without hemorrhage    GI bleed 09/17/2021   Acute upper GI bleeding 09/11/2021   Mucosal abnormality of esophagus    Mucosal abnormality of stomach    Mucosal abnormality of duodenum    Upper GI bleed     Left hip pain 11/16/2020   Type 2 diabetes mellitus with other specified complication (HCC) 11/14/2020   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/14/2020   Primary osteoarthritis of left hip 10/07/2020   Status post reverse total arthroplasty of right shoulder 08/24/2020   Diverticulitis 03/08/2020   ESRD (end stage renal disease) (HCC) 03/08/2020   Polyneuropathy in diseases classified elsewhere 03/08/2020   Pure hypercholesterolemia 03/08/2020   Sacral back pain 03/08/2020   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 11/10/2019   Acute gastric ulcer with hemorrhage    Acute GI bleeding 07/31/2019   Hypotension of hemodialysis 07/31/2019   Diverticulitis large intestine 07/31/2019   Mass of soft tissue of shoulder 05/26/2019   Awaiting organ transplant status 03/18/2019   Finding of cocaine in blood 02/25/2019   Anaphylactic shock, unspecified, sequela 11/21/2018   Hypercalcemia 10/28/2018   Diarrhea, unspecified 08/08/2018   Hyperkalemia 03/12/2018   Renal cell carcinoma of left kidney (HCC) 12/24/2017   Fluid overload, unspecified 07/06/2017   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   Other specified disorders of kidney and ureter 11/22/2016   Secondary hyperparathyroidism of renal origin 10/23/2016   Headache disorder 08/21/2016   TIA (transient ischemic attack)    Essential hypertension    Seizure (HCC)    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    Lung nodule    Diverticulitis of cecum 07/21/2015   Diverticulitis of large intestine without perforation or abscess without bleeding    Right lower quadrant pain    Obstructive sleep apnea 02/08/2015   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   Diverticulosis 07/13/2014   Lower GI bleed 07/12/2014   Refusal of blood transfusions as patient is Jehovah's Witness    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   HLD (hyperlipidemia)    PRES (posterior reversible  encephalopathy syndrome)    History of CVA (cerebrovascular accident) 05/23/2014   Anemia of chronic disease 05/23/2014   GERD (gastroesophageal reflux disease) 12/09/2012   Gastrointestinal bleeding 11/12/2012   Melena 11/12/2012   Other psychoactive substance dependence, uncomplicated (HCC) 10/08/2012   Iron deficiency anemia, unspecified 06/10/2012   Moderate protein-calorie malnutrition 07/29/2009   Coagulation defect, unspecified 08/29/2006   Type 2 diabetes mellitus with diabetic peripheral angiopathy without gangrene (HCC) 08/29/2006   Hypertensive chronic kidney disease with stage 5 chronic kidney disease or end stage renal disease (HCC) 08/02/2006   PCP:  [name], MD Pharmacy:   [facility] [address] [place] - [address] [place] [address] Phone: [phone] Fax: [phone]     Social Drivers of Health (SDOH) Social History: SDOH Screenings   Food Insecurity: No Food Insecurity (02/10/2024)  Housing: Low Risk (02/10/2024)  Transportation Needs: No Transportation Needs (02/10/2024)  Utilities: Not At Risk (02/10/2024)  Depression (PHQ2-9): Medium Risk (08/19/2023)  Social Connections: Socially Isolated (02/10/2024)  Tobacco Use: Medium Risk (02/09/2024)   SDOH Interventions:     Readmission Risk Interventions    05/30/2023   12:03 PM  Readmission Risk Prevention Plan  Transportation Screening Complete  HRI or Home Care Consult Complete  Social Work Consult for Recovery Care Planning/Counseling Complete  Palliative Care Screening Not Applicable  Medication Review Oceanographer) Complete

## 2024-02-18 NOTE — Progress Notes (Signed)
 Physical Therapy Treatment Patient Details Name: Carl Oestreicher Sr. MRN: [id number] DOB: Jan 25, 1961 Today's Date: 02/18/2024   History of Present Illness Pt is a 64 y.o. male who presented 02/07/24 with progressive R-sided weakness and pain along with L ear pain and drainage. Pt septic with L acute otitis media. MRI brain showed no acute findings. MRI cervical spine demonstrated no interval change from prior surgery and no clear etiology for current weakness. Imaging did note prior left-sided strokes, without evidence of acute stroke or recrudescence. Neurosurgery consulted and recommends no acute intervention. R weakness suspected recrudescence of prior symptoms in setting of severe infection. PMH includes: recurrent GIB, epilepsy, PRES, TIA, HTN, renal cell carcinoma, CVA, OSA, ESRD, DM II, Ischemic colitis, anterior cervical corpectomy C4 and C3-C5 fusion (Dr. [name], 05/25/2023), PAD s/p angioplasty/stenting right SFA and popliteal arteries 12/09/2023 for critical limb ischemia    PT Comments  Coordinated with nursing for pain medication prior to session; pt also asked for pain medication. Pt had not had pain medication upon entering room. Pt was in significant pain with warmth, swelling and pain 10/10 sitting EOB. Attempted P/ROM with ~ 15 degrees of movement mid range at the R knee, attempted patella mobs to see if this helped with pain without any improvement. MD/RN and CM were notified of pt pain that is significantly limiting mobility; MD Deliliah Room stated they will take a look vai secure chat. Due to pt current functional status, home set up and available assistance at home recommending skilled physical therapy services < 3 hours/day in order to address strength, balance and functional mobility to decrease risk for falls, injury, immobility, skin break down and re-hospitalization.      If plan is discharge home, recommend the following: Two people to help with walking and/or transfers;Two people  to help with bathing/dressing/bathroom;Assistance with cooking/housework;Assist for transportation     Equipment Recommendations  BSC/3in1;Hospital bed;Hoyer lift       Precautions / Restrictions Precautions Precautions: Fall;Other (comment) Precaution/Restrictions Comments: watch HR, R knee buckles Restrictions Weight Bearing Restrictions Per Provider Order: No     Mobility  Bed Mobility Overal bed mobility: Needs Assistance Bed Mobility: Supine to Sit, Sit to Supine       Sit to supine: +2 for physical assistance, Max assist   General bed mobility comments: Max A for the RLE to get to supine due to pain in the knee. Pt was sitting up EOB on arrival    Transfers   General transfer comment: unable today due to pain     Modified Rankin (Stroke Patients Only) Modified Rankin (Stroke Patients Only) Pre-Morbid Rankin Score: Slight disability Modified Rankin: Moderately severe disability     Balance Overall balance assessment: Needs assistance Sitting-balance support: Feet supported Sitting balance-Leahy Scale: Fair Sitting balance - Comments: supervision sitting EOB        Communication Communication Communication: No apparent difficulties  Cognition Arousal: Alert Behavior During Therapy: WFL for tasks assessed/performed   PT - Cognitive impairments: No apparent impairments   Following commands: Intact      Cueing Cueing Techniques: Verbal cues  Exercises General Exercises - Lower Extremity Short Arc Quad: PROM, Right, 5 reps    General Comments General comments (skin integrity, edema, etc.): pt sign significant pain/warmth at the R knee today.      Pertinent Vitals/Pain Pain Assessment Pain Assessment: 0-10 Pain Score: 10-Worst pain ever Pain Location: R knee Pain Descriptors / Indicators: Discomfort, Grimacing Pain Intervention(s): Monitored during session, Limited activity within patient's tolerance,  Repositioned     PT Goals (current goals  can now be found in the care plan section) Acute Rehab PT Goals Patient Stated Goal: to improve PT Goal Formulation: With patient Time For Goal Achievement: 02/23/24 Potential to Achieve Goals: Fair Progress towards PT goals: Not progressing toward goals - comment (pt is limited due to R knee pain)    Frequency    Min 3X/week      PT Plan  Continue with current POC        AM-PAC PT 6 Clicks Mobility   Outcome Measure  Help needed turning from your back to your side while in a flat bed without using bedrails?: A Little Help needed moving from lying on your back to sitting on the side of a flat bed without using bedrails?: A Little Help needed moving to and from a bed to a chair (including a wheelchair)?: A Lot Help needed standing up from a chair using your arms (e.g., wheelchair or bedside chair)?: A Lot Help needed to walk in hospital room?: Total Help needed climbing 3-5 steps with a railing? : Total 6 Click Score: 12    End of Session   Activity Tolerance: Patient limited by pain Patient left: with call bell/phone within reach;in bed;with bed alarm set Nurse Communication: Mobility status;Other (comment) (RN, MD and CM notified on pt R knee pain/warmth/swelling via chat) PT Visit Diagnosis: Unsteadiness on feet (R26.81);Other abnormalities of gait and mobility (R26.89);Muscle weakness (generalized) (M62.81);History of falling (Z91.81);Difficulty in walking, not elsewhere classified (R26.2);Repeated falls (R29.6);Other symptoms and signs involving the nervous system (R29.898)     Time: 8691-8678 PT Time Calculation (min) (ACUTE ONLY): 13 min  Charges:    $Therapeutic Exercise: 8-22 mins $Therapeutic Activity: 8-22 mins PT General Charges $$ ACUTE PT VISIT: 1 Visit                    [name], DPT, CLT  Acute Rehabilitation Services Office: [phone] (Secure chat preferred)    [name] 02/18/2024, 1:41 PM

## 2024-02-18 NOTE — Procedures (Signed)
 Procedure: Right knee aspiration and injection   Indication: Right knee effusion(s)   Surgeon: Ozell Ned, PA-C   Assist: None   Anesthesia: Topical refrigerant   EBL: None   Complications: None   Findings: After risks/benefits explained patient desires to undergo procedure. Consent obtained and time out performed. The right knee was sterilely prepped and aspirated. 76ml cloudy yellow fluid obtained. 6ml 0.5% Marcaine  instilled. Pt tolerated the procedure well.       Ozell DOROTHA Ned, PA-C Orthopedic Surgery [phone]

## 2024-02-19 ENCOUNTER — Encounter: Attending: Registered Nurse | Admitting: Physical Medicine and Rehabilitation

## 2024-02-19 DIAGNOSIS — H66012 Acute suppurative otitis media with spontaneous rupture of ear drum, left ear: Secondary | ICD-10-CM | POA: Diagnosis not present

## 2024-02-19 LAB — RENAL FUNCTION PANEL
Albumin: 2.9 g/dL — ABNORMAL LOW (ref 3.5–5.0)
Anion gap: 20 — ABNORMAL HIGH (ref 5–15)
BUN: 60 mg/dL — ABNORMAL HIGH (ref 8–23)
CO2: 24 mmol/L (ref 22–32)
Calcium: 9.3 mg/dL (ref 8.9–10.3)
Chloride: 87 mmol/L — ABNORMAL LOW (ref 98–111)
Creatinine, Ser: 8.54 mg/dL — ABNORMAL HIGH (ref 0.61–1.24)
GFR, Estimated: 6 mL/min — ABNORMAL LOW
Glucose, Bld: 99 mg/dL (ref 70–99)
Phosphorus: 6.5 mg/dL — ABNORMAL HIGH (ref 2.5–4.6)
Potassium: 5.8 mmol/L — ABNORMAL HIGH (ref 3.5–5.1)
Sodium: 132 mmol/L — ABNORMAL LOW (ref 135–145)

## 2024-02-19 LAB — GLUCOSE, CAPILLARY
Glucose-Capillary: 95 mg/dL (ref 70–99)
Glucose-Capillary: 103 mg/dL — ABNORMAL HIGH (ref 70–99)
Glucose-Capillary: 87 mg/dL (ref 70–99)

## 2024-02-19 MED ORDER — CIPROFLOXACIN-DEXAMETHASONE 0.3-0.1 % OT SUSP
4.0000 [drp] | Freq: Two times a day (BID) | OTIC | Status: DC
  Administered 2024-02-19 – 2024-02-22 (×7): 4 [drp] via OTIC
  Filled 2024-02-19: qty 7.5

## 2024-02-19 MED ORDER — PENTAFLUOROPROP-TETRAFLUOROETH EX AERO: 1.0000 | INHALATION_SPRAY | CUTANEOUS | Status: DC | PRN

## 2024-02-19 MED ORDER — HEPARIN SODIUM (PORCINE) 1000 UNIT/ML DIALYSIS: 1000.0000 [IU] | INTRAMUSCULAR | Status: DC | PRN

## 2024-02-19 MED ORDER — CIPROFLOXACIN-DEXAMETHASONE 0.3-0.1 % OT SUSP: 4.0000 [drp] | Freq: Two times a day (BID) | OTIC | Status: DC

## 2024-02-19 MED ORDER — HEPARIN SODIUM (PORCINE) 1000 UNIT/ML DIALYSIS: 3000.0000 [IU] | Freq: Once | INTRAMUSCULAR | Status: DC

## 2024-02-19 MED ORDER — MIDODRINE HCL 5 MG PO TABS
ORAL_TABLET | ORAL | Status: AC
  Filled 2024-02-19: qty 2

## 2024-02-19 MED ORDER — LIDOCAINE-PRILOCAINE 2.5-2.5 % EX CREA: 1.0000 | TOPICAL_CREAM | CUTANEOUS | Status: DC | PRN

## 2024-02-19 MED ORDER — LIDOCAINE HCL (PF) 1 % IJ SOLN: 5.0000 mL | INTRAMUSCULAR | Status: DC | PRN

## 2024-02-19 MED ORDER — ALTEPLASE 2 MG IJ SOLR: 2.0000 mg | Freq: Once | INTRAMUSCULAR | Status: DC | PRN

## 2024-02-19 MED ORDER — ANTICOAGULANT SODIUM CITRATE 4% (200MG/5ML) IV SOLN: 5.0000 mL | Status: DC | PRN

## 2024-02-19 MED ORDER — HEPARIN SODIUM (PORCINE) 1000 UNIT/ML DIALYSIS: 2500.0000 [IU] | Freq: Once | INTRAMUSCULAR | Status: DC

## 2024-02-19 MED ORDER — SODIUM ZIRCONIUM CYCLOSILICATE 5 G PO PACK: 5.0000 g | PACK | Freq: Once | ORAL | Status: DC

## 2024-02-19 MED ORDER — AMOXICILLIN-POT CLAVULANATE 500-125 MG PO TABS
1.0000 | ORAL_TABLET | Freq: Once | ORAL | Status: DC
  Filled 2024-02-19: qty 1

## 2024-02-19 MED ORDER — HEPARIN SODIUM (PORCINE) 1000 UNIT/ML DIALYSIS: 1500.0000 [IU] | INTRAMUSCULAR | Status: DC | PRN

## 2024-02-19 NOTE — Progress Notes (Signed)
 " PROGRESS NOTE    Carl Dallman Sr.  FMW:[id number] DOB: Feb 18, 1960 DOA: 02/08/2024 PCP: [name], MD   Brief Narrative: 64 year old with past medical history significant for ESRD on hemodialysis MWF, PVD s/p angioplasty right S FA October 2025 for ischemic rest pain as well as C4 spinal fracture April 2025 with incomplete spinal cord injury requiring C4 corpectomy, fusion and interbody cage by Dr. [name], wheelchair-bound for a time but progressed by Agurs to walking with a cane and driving, presented with right arm and leg weakness, fall as well as posterior neck pain, fever, ear drainage.  Admitted started on antibiotics, neurosurgery, neurology orthopedic ENT and nephrology consulted.  Mildly diagnosed with otitis media and ruptured eardrum.  Right-sided weakness believed to be recrudescence of old spinal cord injury symptoms in the setting of infection.  Denied  CIR placement by insurance. Now awaiting SNF.    Assessment & Plan:   Principal Problem:   Sepsis (HCC) Active Problems:   GERD (gastroesophageal reflux disease)   HLD (hyperlipidemia)   Essential hypertension   Diarrhea, unspecified   ESRD (end stage renal disease) (HCC)   Obstructive sleep apnea   Type 2 diabetes mellitus with diabetic peripheral angiopathy without gangrene (HCC)   Acute suppurative otitis media of left ear with spontaneous rupture of tympanic membrane   Mastoid disorder, bilateral   Conductive hearing loss   Acute right-sided weakness   1-Sepsis due to otitis media with suspected TM perforation - Patient met sepsis criteria on admission suspected due to suppurative otitis media.  Blood cultures no growth to date.  COVID flu RSV negative - ENT [name] was consulted and patient underwent CT of the temporal bones without evidence of coalescent mastoiditis, bone breakdown or abscess -He completed 10 days of antibiotics, last dose today - Patient had low-grade fever 1/6. No further fever  since yesterday. If spike fever will need repeat blood cultures, workup -needs follow up with ENT on January 5 -- 12th. - Continue with  Ciprodex  drops, ENT recommend it for 14 days.   Right-sided weakness History of cervical decompression corpectomy C4 by [name] April 2025 Remote left occipital infarct - There was some concern for recurrent spinal cord injury, neurosurgery and neurology were consulted.  A spinal MRI showed no new cord injury. - It was thought that his symptoms were recrudescence of old  symptoms due to  acute infection.   Right shoulder pain, history of right reverse shoulder arthroplasty Right knee tricompartmental osteoarthritis with moderate joint effusion Ortho consulted.  Had knee aspiration and injection on  1/06  -knee synovial fluid, no growth to date.  Peripheral vascular disease s/p angioplasty October 2025; - Continue aspirin  Lipitor and Plavix   ESRD on hemodialysis: MWF - Nephrology following.  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   Weight as of this encounter: 84.4 kg.   DVT prophylaxis: Heparin  Code Status: Full code Family Communication: Care discussed with patient Disposition Plan:  Status is: Inpatient Remains inpatient  appropriate because: Awaiting skilled nursing facility    Consultants:  Nephrology ENT Neurology  Procedures:  HD  Antimicrobials:    Subjective: He is alert and  conversant.  Report left ear fullness persist but improved. No further drainage.   Objective: Vitals:   02/19/24 0100 02/19/24 0157 02/19/24 0500 02/19/24 0525  BP: (!) 90/57 96/60  (!) 87/57  Pulse: 97   93  Resp: 17     Temp: 99.5 F (37.5 C)   98.6 F (37 C)  TempSrc: Oral   Oral  SpO2: 90%   96%  Weight:   84.4 kg   Height:       No intake or output data in the 24 hours ending 02/19/24 0834 Filed Weights   02/16/24 0404 02/17/24 0500 02/19/24 0500  Weight: 84.2 kg 86.1 kg 84.4 kg    Examination:  General exam: Appears calm and comfortable  Respiratory system: Clear to auscultation. Respiratory effort normal. Cardiovascular system: S1 & S2 heard, RRR. No JVD, murmurs, rubs, gallops or clicks. No pedal edema. Gastrointestinal system: Abdomen is nondistended, soft and nontender. No organomegaly or masses felt. Normal bowel sounds heard. Central nervous system: Alert and oriented.  Extremities: Symmetric 5 x 5 power.    Data Reviewed: I have personally reviewed following labs and imaging studies  CBC: Recent Labs  Lab 02/12/24 1844 02/13/24 0746 02/18/24 1823  WBC 10.0 9.1 9.0  NEUTROABS 7.0  --  7.2  HGB 9.3* 10.0* 9.9*  HCT 28.0* 29.8* 30.2*  MCV 88.9 87.6 88.6  PLT 339 353 501*   Basic Metabolic Panel: Recent Labs  Lab 02/15/24 0531 02/16/24 0545 02/17/24 0447 02/18/24 1823 02/19/24 0256  NA 133* 128* 129* 133* 132*  K 4.7 4.2 5.6* 5.2* 5.8*  CL 89* 68* 86* 89* 87*  CO2 27 19* 23 27 24   GLUCOSE 89 376* 80 112* 99  BUN 44* 53* 80* 49* 60*  CREATININE 7.03* 7.19* 11.00* 7.91* 8.54*  CALCIUM  9.8 7.8* 9.2 9.2 9.3  PHOS 4.7* 4.1 5.8* 5.2* 6.5*   GFR: Estimated Creatinine Clearance: 9.7 mL/min (A) (by C-G formula based on SCr of 8.54 mg/dL (H)). Liver Function Tests: Recent Labs  Lab 02/15/24 0531 02/16/24 0545 02/17/24 0447 02/18/24 1823 02/19/24 0256  ALBUMIN  3.2* 2.7* 2.9* 3.2* 2.9*   No results for input(s): LIPASE, AMYLASE in the  last 168 hours. No results for input(s): AMMONIA in the last 168 hours. Coagulation Profile: No results for input(s): INR, PROTIME in the last 168 hours. Cardiac Enzymes: No results for input(s): CKTOTAL, CKMB, CKMBINDEX, TROPONINI in the last 168 hours. BNP (last 3 results) No results for input(s): PROBNP in the last 8760 hours. HbA1C: No results for input(s): HGBA1C in the last 72 hours. CBG: Recent Labs  Lab 02/16/24 1548 02/16/24 2124 02/17/24 2127 02/19/24 0736 02/19/24 0757  GLUCAP 108* 103* 92 30* 95   Lipid Profile: No results for input(s): CHOL, HDL, LDLCALC, TRIG, CHOLHDL, LDLDIRECT in the last 72 hours. Thyroid  Function Tests: No results for input(s): TSH, T4TOTAL, FREET4, T3FREE, THYROIDAB in the last 72 hours. Anemia Panel: No results for input(s): VITAMINB12, FOLATE, FERRITIN, TIBC, IRON, RETICCTPCT in the last 72 hours. Sepsis Labs: Recent Labs  Lab 02/18/24 1823  LATICACIDVEN 1.2    Recent Results (from the past 240 hours)  Respiratory (~20 pathogens) panel by PCR     Status: None   Collection Time: 02/09/24  2:13 PM   Specimen: Nasopharyngeal Swab; Respiratory  Result Value Ref Range Status  Adenovirus NOT DETECTED NOT DETECTED Final   Coronavirus 229E NOT DETECTED NOT DETECTED Final    Comment: (NOTE) The Coronavirus on the Respiratory Panel, DOES NOT test for the novel  Coronavirus (2019 nCoV)    Coronavirus HKU1 NOT DETECTED NOT DETECTED Final   Coronavirus NL63 NOT DETECTED NOT DETECTED Final   Coronavirus OC43 NOT DETECTED NOT DETECTED Final   Metapneumovirus NOT DETECTED NOT DETECTED Final   Rhinovirus / Enterovirus NOT DETECTED NOT DETECTED Final   Influenza A NOT DETECTED NOT DETECTED Final   Influenza B NOT DETECTED NOT DETECTED Final   Parainfluenza Virus 1 NOT DETECTED NOT DETECTED Final   Parainfluenza Virus 2 NOT DETECTED NOT DETECTED Final   Parainfluenza Virus 3 NOT DETECTED NOT  DETECTED Final   Parainfluenza Virus 4 NOT DETECTED NOT DETECTED Final   Respiratory Syncytial Virus NOT DETECTED NOT DETECTED Final   Bordetella pertussis NOT DETECTED NOT DETECTED Final   Bordetella Parapertussis NOT DETECTED NOT DETECTED Final   Chlamydophila pneumoniae NOT DETECTED NOT DETECTED Final   Mycoplasma pneumoniae NOT DETECTED NOT DETECTED Final    Comment: Performed at [facility], [address] [place], [name]  Body fluid culture w Gram Stain     Status: None (Preliminary result)   Collection Time: 02/18/24  2:24 PM   Specimen: Synovium; Body Fluid  Result Value Ref Range Status   Specimen Description SYNOVIAL  Final   Special Requests RIGHT KNEE  Final   Gram Stain   Final    ABUNDANT WBC PRESENT, PREDOMINANTLY PMN NO ORGANISMS SEEN Performed at [facility], [address], [place], [name]    Culture PENDING  Incomplete   Report Status PENDING  Incomplete         Radiology Studies: No results found.      Scheduled Meds:  (feeding supplement) PROSource Plus  30 mL Oral BID BM   aspirin  EC  81 mg Oral Daily   atorvastatin   10 mg Oral Daily   bupivacaine (PF)  10 mL Infiltration Once   Chlorhexidine  Gluconate Cloth  6 each Topical Q0600   cinacalcet   120 mg Oral Q breakfast   clopidogrel   75 mg Oral Daily   darbepoetin (ARANESP ) injection - DIALYSIS  40 mcg Subcutaneous Q Fri-1800   gabapentin   300 mg Oral QHS   heparin   5,000 Units Subcutaneous Q8H   insulin  aspart  0-5 Units Subcutaneous QHS   insulin  aspart  0-6 Units Subcutaneous TID WC   lidocaine   1 patch Transdermal Q24H   lidocaine   1 patch Transdermal Q24H   midodrine   10 mg Oral TID WC   multivitamin  1 tablet Oral QHS   pantoprazole   40 mg Oral BID AC   psyllium  1 packet Oral Daily   sevelamer  carbonate  2,400 mg Oral TID WC   sodium chloride  flush  3 mL Intravenous Q12H   Continuous Infusions:   LOS: 8 days    Time spent: 35  Minutes    Catalaya Garr A Aison Malveaux, MD [facility]   If 7PM-7AM, please contact night-coverage [email or web address]  02/19/2024, 8:34 AM   "

## 2024-02-19 NOTE — Plan of Care (Signed)
 Patient A&O X4. Patient stated he wanted to walk to bathroom, nurse advised patient it would be best to use bed pan. Patient criticizes staff, stating he wanted male staff to help him get up. Patient informed there was no male staff available. Nursing staff used the Steady to help patient to toilet, three staff members utilized. Patient refused linens change. Patient left with bed in lowest position and call bell in reach.    Problem: Education: Goal: Knowledge of General Education information will improve Description: Including pain rating scale, medication(s)/side effects and non-pharmacologic comfort measures Outcome: Progressing   Problem: Health Behavior/Discharge Planning: Goal: Ability to manage health-related needs will improve Outcome: Progressing   Problem: Clinical Measurements: Goal: Ability to maintain clinical measurements within normal limits will improve Outcome: Progressing   Problem: Activity: Goal: Risk for activity intolerance will decrease Outcome: Progressing   Problem: Nutrition: Goal: Adequate nutrition will be maintained Outcome: Progressing   Problem: Coping: Goal: Level of anxiety will decrease Outcome: Progressing   Problem: Pain Managment: Goal: General experience of comfort will improve and/or be controlled Outcome: Progressing   Problem: Safety: Goal: Ability to remain free from injury will improve Outcome: Progressing   Problem: Education: Goal: Ability to describe self-care measures that may prevent or decrease complications (Diabetes Survival Skills Education) will improve Outcome: Progressing   Problem: Coping: Goal: Ability to adjust to condition or change in health will improve Outcome: Progressing

## 2024-02-19 NOTE — Progress Notes (Signed)
 " [facility] Progress Note   Subjective:    Seen in room No c/o's  Presentation summary:  64 y.o. male with ESRD on HD, HTN, PAD, h/o CVA, h/o cervical spine fusion in April 2025. He is admitted with new onset right sided weakness. Symptoms started about a week ago. Unable move right leg and has been falling at home. No acute CVA on brain MRI. Cervical spine MRI demonstrates residual mild stenosis C3-4. Neurosurgery consulted. Labs Na 134, K 5.2, BUN 61, WBC 12.1, Hgb 10.1. Febrile on arrival temp 102.1. Blood cultures collected. Receiving antibiotics for suspected otitis media. Presented with ear pain and drainage. Seen and examined in the ED. Progressive right sided weakness. Difficult to lift right arm, leg. He says had similar symptoms when he needed surgery in April. Denies chest pain, sob, nausea/vomiting.  HD MWF. Last dialysis Friday. He usually drives himself but son had to drive d/t weakness. He did complete full treatment. Meeting dry weight. Would be due for dialysis today per his OP clinic holiday schedule.    Objective Vitals:   02/19/24 0500 02/19/24 0525 02/19/24 1154 02/19/24 1447  BP:  (!) 87/57 (!) 76/58 (!) 80/58  Pulse:  93 89 86  Resp:   18 17  Temp:  98.6 F (37 C) 98.6 F (37 C) 98.7 F (37.1 C)  TempSrc:  Oral Oral Oral  SpO2:  96% 98% 99%  Weight: 84.4 kg     Height:       Physical Exam General: alert male in NAD Heart: RRR, no murmur Lungs: CTA bilaterally Abdomen: soft, non-distended Extremities: no edema b/l lower extremities Dialysis Access:  AVF+ bruit  Dialysis Orders: MWF NW 4h  B400  87.3kg  2K bath  AVF   Heparin  3000 ESA: Mircera 50 q 2 wks (last 12/15) BMM: Calcitriol  2 mcg PO q HD, cinacalcet  120 mg daily   Assessment/Plan: ESRD: on HD MWF. HD today.  Volume: looks euvolemic on exam. Just under dry wt, lower dry wt on dc. Keep even w/ HD.  Hypotension: BP chronically low end - continue midodrine  10mg  with HD. Declines  blood products/albumin . Anemia of ESRD: ESA darbe 40mcg sq weekly  R sided weakness. Hx cervical spine injury/fusion. No acute CVA on MRI, does have significant DDD but no cord compression and no surgical recs per neurosurgery note. Turned down by CIR. Mastoiditis/ sepsis: w/ ear pain. Blood Cx negative. On IV Zosyn . Sepsis present on admission felt to be due to suppurative otitis media.  Metabolic bone disease. CorrCa high, continue sevelamer  binder + sensipar , calcitriol  on hold. Nutrition: Alb low, continue supps and renal MVI.  Dispo: not clear at this time   Myer Fret  MD  [facility] 02/19/2024, 3:57 PM  Recent Labs  Lab 02/13/24 0746 02/14/24 0138 02/18/24 1823 02/19/24 0256  HGB 10.0*  --  9.9*  --   ALBUMIN  3.1*   < > 3.2* 2.9*  CALCIUM  9.8   < > 9.2 9.3  PHOS 3.5   < > 5.2* 6.5*  CREATININE 5.90*   < > 7.91* 8.54*  K 4.3   < > 5.2* 5.8*   < > = values in this interval not displayed.    Inpatient medications:  (feeding supplement) PROSource Plus  30 mL Oral BID BM   amoxicillin -clavulanate  1 tablet Oral Once   aspirin  EC  81 mg Oral Daily   atorvastatin   10 mg Oral Daily   bupivacaine (PF)  10 mL Infiltration Once  Chlorhexidine  Gluconate Cloth  6 each Topical Q0600   cinacalcet   120 mg Oral Q breakfast   ciprofloxacin -dexamethasone   4 drop Left EAR BID   clopidogrel   75 mg Oral Daily   darbepoetin (ARANESP ) injection - DIALYSIS  40 mcg Subcutaneous Q Fri-1800   gabapentin   300 mg Oral QHS   heparin   5,000 Units Subcutaneous Q8H   insulin  aspart  0-6 Units Subcutaneous TID WC   lidocaine   1 patch Transdermal Q24H   lidocaine   1 patch Transdermal Q24H   midodrine   10 mg Oral TID WC   multivitamin  1 tablet Oral QHS   pantoprazole   40 mg Oral BID AC   psyllium  1 packet Oral Daily   sevelamer  carbonate  2,400 mg Oral TID WC   sodium chloride  flush  3 mL Intravenous Q12H   sodium zirconium cyclosilicate   5 g Oral Once     acetaminophen  **OR** acetaminophen ,  cyclobenzaprine , diphenhydrAMINE , melatonin, midodrine , oxyCODONE -acetaminophen        "

## 2024-02-19 NOTE — Progress Notes (Signed)
 OT Cancellation Note  Patient Details Name: Carl Obeso Sr. MRN: [id number] DOB: [birth year]   Cancelled Treatment:    Reason Eval/Treat Not Completed: Patient at procedure or test/ unavailable (Pt off unit for HD. OT to reattempt to see pt at a later time as appropriate/available.)  Margarie Rockey HERO., OTR/L, MA Acute Rehab [phone]   Margarie FORBES Horns 02/19/2024, 3:53 PM

## 2024-02-19 NOTE — Progress Notes (Signed)
" °   02/19/24 1927  Vitals  Temp 97.6 F (36.4 C)  Temp Source Oral  BP (!) 90/58  MAP (mmHg) 65  BP Location Right Arm  BP Method Automatic  Patient Position (if appropriate) Lying  Pulse Rate 88  ECG Heart Rate 89  Resp 12  Weight 85.7 kg  Type of Weight Post-Dialysis  Oxygen Therapy  SpO2 99 %  O2 Device Room Air  Patient Activity (if Appropriate) In bed  Pulse Oximetry Type Continuous  Oximetry Probe Site Changed No  During Treatment Monitoring  Blood Flow Rate (mL/min) 399 mL/min  Arterial Pressure (mmHg) -197.57 mmHg  Venous Pressure (mmHg) 236.96 mmHg  TMP (mmHg) 2.42 mmHg  Ultrafiltration Rate (mL/min) 286 mL/min  Dialysate Flow Rate (mL/min) 300 ml/min  Duration of HD Treatment -hour(s) 3.42 hour(s)  Cumulative Fluid Removed (mL) per Treatment  -222.46  HD Safety Checks Performed Yes  Intra-Hemodialysis Comments Tolerated well;Tx completed  Post Treatment  Dialyzer Clearance Lightly streaked  Liters Processed 84  Fluid Removed (mL) 0 mL  Tolerated HD Treatment Yes  AVG/AVF Arterial Site Held (minutes) 6 minutes  AVG/AVF Venous Site Held (minutes) 6 minutes  Fistula / Graft Right Forearm  No placement date or time found.   Orientation: Right  Access Location: Forearm  Site Condition No complications  Fistula / Graft Assessment Present;Thrill;Bruit  Status Deaccessed  Drainage Description None    "

## 2024-02-19 NOTE — Progress Notes (Signed)
 Speech Language Pathology Treatment: Cognitive-Linguistic  Patient Details Name: Carl Mier Sr. MRN: [id number] DOB: Jan 01, 1961 Today's Date: 02/19/2024 Time: 1030-1050 SLP Time Calculation (min) (ACUTE ONLY): 20 min  Assessment / Plan / Recommendation Clinical Impression  Skilled therapy session focused on cognitive goals. SLP facilitated session by educating patient on WRAP (write, repeat, associate, picture) memory strategies and providing examples of each. Patient recalled utilizing calendar on phone and My Chart to keep track of appointments, bills and events. Patient recalled 4/4 strategies after a 15 minute delay independently. Patient remains tangential and hyperverbose, though this is likely baseline. Patient independently recalled how to contact nursing and ID call bell. Patient has met all acute ST goals and is likely at b/l level of cognitive functioning. No further ST needs warranted.   HPI HPI: Pt is a 64 y.o. male who presented 02/07/24 with progressive R-sided weakness and pain along with L ear pain and drainage. Pt septic with L acute otitis media. MRI brain showed no acute findings. MRI cervical spine demonstrated no interval change from prior surgery and no clear etiology for current weakness. R weakness suspected recrudescence of prior symptoms in setting of severe infection. PMH includes: recurrent GIB, epilepsy, PRES, TIA, HTN, renal cell carcinoma, CVA, OSA, ESRD, DM II, Ischemic colitis, anterior cervical corpectomy C4 and C3-C5 fusion, PAD s/p angioplasty/stenting right SFA and popliteal arteries 12/09/2023 for critical limb ischemia.  Most recent cognitive-linguistic evaluation was completed on 12/03/15 with mild delayed recall deficits observed.      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

## 2024-02-20 ENCOUNTER — Inpatient Hospital Stay (HOSPITAL_COMMUNITY)

## 2024-02-20 ENCOUNTER — Ambulatory Visit: Admitting: Physical Therapy

## 2024-02-20 DIAGNOSIS — Z01810 Encounter for preprocedural cardiovascular examination: Secondary | ICD-10-CM | POA: Diagnosis not present

## 2024-02-20 DIAGNOSIS — I351 Nonrheumatic aortic (valve) insufficiency: Secondary | ICD-10-CM

## 2024-02-20 DIAGNOSIS — H66012 Acute suppurative otitis media with spontaneous rupture of ear drum, left ear: Secondary | ICD-10-CM | POA: Diagnosis not present

## 2024-02-20 LAB — RENAL FUNCTION PANEL
Albumin: 3.1 g/dL — ABNORMAL LOW (ref 3.5–5.0)
Anion gap: 15 (ref 5–15)
BUN: 36 mg/dL — ABNORMAL HIGH (ref 8–23)
CO2: 29 mmol/L (ref 22–32)
Calcium: 9.4 mg/dL (ref 8.9–10.3)
Chloride: 92 mmol/L — ABNORMAL LOW (ref 98–111)
Creatinine, Ser: 5.62 mg/dL — ABNORMAL HIGH (ref 0.61–1.24)
GFR, Estimated: 11 mL/min — ABNORMAL LOW
Glucose, Bld: 76 mg/dL (ref 70–99)
Phosphorus: 4.5 mg/dL (ref 2.5–4.6)
Potassium: 4.8 mmol/L (ref 3.5–5.1)
Sodium: 136 mmol/L (ref 135–145)

## 2024-02-20 LAB — ECHOCARDIOGRAM COMPLETE
AR max vel: 2.78 cm2
AV Peak grad: 13 mmHg
Ao pk vel: 1.8 m/s
Area-P 1/2: 3.6 cm2
Calc EF: 73.4 %
Height: 73 in
MV VTI: 2.61 cm2
S' Lateral: 2.8 cm
Single Plane A2C EF: 74.6 %
Single Plane A4C EF: 71.1 %
Weight: 3022.95 [oz_av]

## 2024-02-20 LAB — GLUCOSE, CAPILLARY
Glucose-Capillary: 89 mg/dL (ref 70–99)
Glucose-Capillary: 72 mg/dL (ref 70–99)
Glucose-Capillary: 78 mg/dL (ref 70–99)
Glucose-Capillary: 100 mg/dL — ABNORMAL HIGH (ref 70–99)
Glucose-Capillary: 93 mg/dL (ref 70–99)

## 2024-02-20 MED ORDER — CHLORHEXIDINE GLUCONATE CLOTH 2 % EX PADS
6.0000 | MEDICATED_PAD | Freq: Every day | CUTANEOUS | Status: DC
  Administered 2024-02-21 – 2024-03-03 (×8): 6 via TOPICAL

## 2024-02-20 MED ORDER — LINEZOLID 600 MG PO TABS
600.0000 mg | ORAL_TABLET | Freq: Two times a day (BID) | ORAL | Status: DC
  Administered 2024-02-20 – 2024-02-23 (×7): 600 mg via ORAL
  Filled 2024-02-20 (×7): qty 1

## 2024-02-20 MED ORDER — AMOXICILLIN-POT CLAVULANATE 500-125 MG PO TABS
1.0000 | ORAL_TABLET | Freq: Once | ORAL | Status: AC
  Administered 2024-02-20: 1 via ORAL
  Filled 2024-02-20: qty 1

## 2024-02-20 MED ORDER — SEVELAMER CARBONATE 800 MG PO TABS
2400.0000 mg | ORAL_TABLET | Freq: Once | ORAL | Status: AC
  Administered 2024-02-20: 2400 mg via ORAL
  Filled 2024-02-20: qty 3

## 2024-02-20 MED ORDER — AMOXICILLIN-POT CLAVULANATE 875-125 MG PO TABS: 1.0000 | ORAL_TABLET | Freq: Two times a day (BID) | ORAL | Status: DC

## 2024-02-20 MED ORDER — AMOXICILLIN-POT CLAVULANATE 500-125 MG PO TABS
1.0000 | ORAL_TABLET | Freq: Every day | ORAL | Status: DC
  Administered 2024-02-21: 1 via ORAL
  Filled 2024-02-20: qty 1

## 2024-02-20 NOTE — Progress Notes (Signed)
 PT Cancellation Note  Patient Details Name: Carl Posthumus Sr. MRN: [id number] DOB: [birth year]   Cancelled Treatment:    Reason Eval/Treat Not Completed: Pain limiting ability to participate (Will hold physical therapy today. Pt going for aspiration of the R knee due to concerns for septic arthritis. Will follow up as able and appropriate.)  [name], DPT, CLT  Acute Rehabilitation Services Office: [phone] (Secure chat preferred)   [name] 02/20/2024, 2:33 PM

## 2024-02-20 NOTE — Plan of Care (Signed)

## 2024-02-20 NOTE — Progress Notes (Signed)
 " [name] Progress Note   Subjective:    Seen in room No c/o's  Presentation summary:  64 y.o. male with ESRD on HD, HTN, PAD, h/o CVA, h/o cervical spine fusion in April 2025. He is admitted with new onset right sided weakness. Symptoms started about a week ago. Unable move right leg and has been falling at home. No acute CVA on brain MRI. Cervical spine MRI demonstrates residual mild stenosis C3-4. Neurosurgery consulted. Labs Na 134, K 5.2, BUN 61, WBC 12.1, Hgb 10.1. Febrile on arrival temp 102.1. Blood cultures collected. Receiving antibiotics for suspected otitis media. Presented with ear pain and drainage. Seen and examined in the ED. Progressive right sided weakness. Difficult to lift right arm, leg. He says had similar symptoms when he needed surgery in April. Denies chest pain, sob, nausea/vomiting.  HD MWF. Last dialysis Friday. He usually drives himself but son had to drive d/t weakness. He did complete full treatment. Meeting dry weight. Would be due for dialysis today per his OP clinic holiday schedule.    Objective Vitals:   02/20/24 0500 02/20/24 0551 02/20/24 0831 02/20/24 1415  BP:  (!) 88/62 (!) 86/58 100/65  Pulse:  100 84 82  Resp:   20 20  Temp:   98.3 F (36.8 C) 97.8 F (36.6 C)  TempSrc:   Oral   SpO2:  97% 91% 100%  Weight: 85.7 kg     Height:       Physical Exam General: alert male in NAD Heart: RRR, no murmur Lungs: CTA bilaterally Abdomen: soft, non-distended Extremities: no edema b/l lower extremities Dialysis Access:  AVF+ bruit  Dialysis Orders: MWF NW 4h  B400  87.3kg  2K bath  AVF   Heparin  3000 ESA: Mircera 50 q 2 wks (last 12/15) BMM: Calcitriol  2 mcg PO q HD, cinacalcet  120 mg daily   Assessment/Plan: ESRD: on HD MWF. HD today.  Volume: looks euvolemic on exam, BP's soft and below dry wt. Keep even next HD.  Hypotension: BP chronically low end - continue midodrine  10mg  with HD. Declines blood products/albumin . Anemia of  ESRD: ESA darbe 40mcg sq weekly  R sided weakness. Hx cervical spine injury/fusion. No acute CVA on MRI, does have significant DDD but no cord compression and no surgical recs per neurosurgery note. Turned down by CIR. Mastoiditis/ sepsis: w/ ear pain. Blood Cx negative. On IV Zosyn . Sepsis on admission felt due to suppurative otitis media.  Metabolic bone disease. CorrCa high, continue sevelamer  binder + sensipar , calcitriol  on hold. Nutrition: Alb low, continue supps and renal MVI.  Dispo: not clear at this time   Carl Fret  MD  [facility] 02/20/2024, 3:05 PM  Recent Labs  Lab 02/18/24 1823 02/19/24 0256 02/20/24 0332  HGB 9.9*  --   --   ALBUMIN  3.2* 2.9* 3.1*  CALCIUM  9.2 9.3 9.4  PHOS 5.2* 6.5* 4.5  CREATININE 7.91* 8.54* 5.62*  K 5.2* 5.8* 4.8    Inpatient medications:  (feeding supplement) PROSource Plus  30 mL Oral BID BM   [START ON 02/21/2024] amoxicillin -clavulanate  1 tablet Oral QHS   aspirin  EC  81 mg Oral Daily   atorvastatin   10 mg Oral Daily   bupivacaine (PF)  10 mL Infiltration Once   Chlorhexidine  Gluconate Cloth  6 each Topical Q0600   cinacalcet   120 mg Oral Q breakfast   ciprofloxacin -dexamethasone   4 drop Left EAR BID   clopidogrel   75 mg Oral Daily   darbepoetin (ARANESP ) injection -  DIALYSIS  40 mcg Subcutaneous Q Fri-1800   gabapentin   300 mg Oral QHS   heparin   5,000 Units Subcutaneous Q8H   insulin  aspart  0-6 Units Subcutaneous TID WC   lidocaine   1 patch Transdermal Q24H   lidocaine   1 patch Transdermal Q24H   linezolid   600 mg Oral Q12H   midodrine   10 mg Oral TID WC   multivitamin  1 tablet Oral QHS   pantoprazole   40 mg Oral BID AC   psyllium  1 packet Oral Daily   sevelamer  carbonate  2,400 mg Oral TID WC   sodium chloride  flush  3 mL Intravenous Q12H   sodium zirconium cyclosilicate   5 g Oral Once     acetaminophen  **OR** acetaminophen , cyclobenzaprine , diphenhydrAMINE , melatonin, midodrine , oxyCODONE -acetaminophen        "

## 2024-02-20 NOTE — Progress Notes (Signed)
 Patient ID: Carl Whittaker Sr., male   DOB: [birth year], 64 y.o.   MRN: [id number]  Noted right knee arthrocentesis unexpectedly showed WBC 71k c/w septic joint. Plan arthroscopy tomorrow with Dr. [name]. Please keep NPO after MN.    Ozell DOROTHA Ned, PA-C Orthopedic Surgery [phone]

## 2024-02-20 NOTE — Consult Note (Signed)
 "  Cardiology Consultation   Patient ID: Carl Dayrit Sr. MRN: [id number]; DOB: 01-23-1961  Admit date: 02/08/2024 Date of Consult: 02/20/2024  PCP:  [name], MD   [facility] Providers Cardiologist:  Shelda Bruckner, MD   Patient Profile: Carl Kohan Sr. is a 64 y.o. male with a hx of Lambl's excrescence, ESRD on HD, HTN, PAD s/p angioplasty right SFA October 2025, non embolic CVA, cervical spine fusion in April 2025 with incomplete spinal cord injury requiring C4 corpectomy, fusion and interbody cage by Dr. [name],  who is being seen 02/20/2024 for the evaluation of preoperative evaluation, abnormal echocardiogram at the request of Dr. Madelyne.  History of Present Illness: [name] has no significant cardiac history.  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  His last echocardiogram in [place] was performed in 2017 without mention of this.  Has prior history of CVA not felt to be embolic and not acute.  Other ischemic workup includes a Myoview  performed 10/2020 which was low risk.  Last seen in 11/2022 and walking 3 to 6 miles a week without any cardiac related symptoms.  We were only seeing for preoperative evaluation and no further testing recommended for carpal tunnel release.  Patient has been admitted since 12/27.  Currently patient being evaluated for septic arthritis with plans for knee arthroscopy tomorrow.  Also with otitis media with ruptured tympanic membrane.  There was concern of right-sided weakness noted on admission after sustaining fall but felt to be a recrudescence of his old spinal cord syndrome.  He did not have any evidence of new cord compression.  Blood cultures have been negative so far.  Had 1 episode of low-grade fever 1/6 but no further fevers.  Cardiology was asked to see for abnormal echocardiogram noting the Lambl's excrescence and for preoperative  evaluation.  Patient with no acute complaints today.  He reports no chest pain, shortness of breath, peripheral edema, orthopnea.  Prior to his cervical issues he was active and walking.  Still trying to ambulate with a walker but reports no exertional symptoms.  Says he is able to ambulate around his house with minimal difficulty.  Very frustrated that he still in the hospital.   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    Parathyroid  abnormality    ectopic parathyroid  gland   Presence of arteriovenous fistula for hemodialysis, primary    RUE PER PT RLE   Refusal of blood product    NO WHOLE BLOOD PROUCTS   Renal cell carcinoma (HCC)    s/p hand assisted laparoscopic bilateral nephrectomies 11/29/17, + RCC left   Secondary hyperparathyroidism    Seizures (HCC)    one episode in past, due to  elevated Potassium 08/02/20- at least 4 years ago   Sleep apnea    doesn't use CPAP anymore since weight loss   Stroke [facility])    no residual    Past Surgical History:  Procedure Laterality Date   ABDOMINAL AORTOGRAM W/LOWER EXTREMITY N/A 12/09/2023   Procedure: ABDOMINAL AORTOGRAM W/LOWER EXTREMITY;  Surgeon: Sheree Penne Bruckner, MD;  Location: [facility] INVASIVE CV LAB;  Service: Cardiovascular;  Laterality: N/A;   ANTERIOR CERVICAL CORPECTOMY N/A 05/25/2023   Procedure: ANTERIOR CERVICAL CORPECTOMY CERVICAL FOUR, CERVICAL THREE - CERVICAL FIVE FUSION;  Surgeon: [name] Shove, MD;  Location: [facility] OR;  Service: Neurosurgery;  Laterality: N/A;   AV FISTULA PLACEMENT Right    right arm   BIOPSY  08/01/2019   Procedure: BIOPSY;  Surgeon: Kristie Lamprey, MD;  Location: [facility] ENDOSCOPY;  Service: Endoscopy;;   BIOPSY  11/18/2020   Procedure: BIOPSY;  Surgeon: Stacia Glendia BRAVO, MD;  Location: [facility] ENDOSCOPY;  Service: Gastroenterology;;   BIOPSY  09/13/2021   Procedure: BIOPSY;  Surgeon: Wilhelmenia Aloha Raddle., MD;  Location: [facility] ENDOSCOPY;  Service: Gastroenterology;;   CATARACT EXTRACTION W/ INTRAOCULAR LENS  IMPLANT, BILATERAL     COLON SURGERY     COLONOSCOPY N/A 08/04/2015   Procedure: COLONOSCOPY;  Surgeon: Gordy CHRISTELLA Starch, MD;  Location: [facility] ENDOSCOPY;  Service: Endoscopy;  Laterality: N/A;   COLONOSCOPY  2017   JMP@ [facility]-good prep-mass -recall 1 yr   COLONOSCOPY N/A 09/13/2021   Procedure: COLONOSCOPY;  Surgeon: Mansouraty, Aloha Raddle., MD;  Location: [facility] ENDOSCOPY;  Service: Gastroenterology;  Laterality: N/A;   COLONOSCOPY WITH PROPOFOL  N/A 11/25/2020   Procedure: COLONOSCOPY WITH PROPOFOL ;  Surgeon: Federico Rosario BROCKS, MD;  Location: [facility] ENDOSCOPY;  Service: Gastroenterology;  Laterality: N/A;   COLONOSCOPY WITH PROPOFOL  N/A 09/23/2021   Procedure: COLONOSCOPY WITH PROPOFOL ;  Surgeon: San Sandor GAILS, DO;  Location: [facility] ENDOSCOPY;  Service: Gastroenterology;  Laterality: N/A;   ENTEROSCOPY N/A 09/23/2021   Procedure: ENTEROSCOPY;  Surgeon: San Sandor GAILS, DO;  Location: [facility] ENDOSCOPY;  Service: Gastroenterology;  Laterality: N/A;  Will place order for video capsule study as we may opt to place it during procedure   ESOPHAGOGASTRODUODENOSCOPY N/A 08/01/2019   Procedure: ESOPHAGOGASTRODUODENOSCOPY (EGD);  Surgeon: Kristie Lamprey, MD;  Location: [facility] ENDOSCOPY;  Service: Endoscopy;  Laterality: N/A;   ESOPHAGOGASTRODUODENOSCOPY N/A 11/18/2020   Procedure: ESOPHAGOGASTRODUODENOSCOPY (EGD);  Surgeon: Stacia Glendia BRAVO, MD;  Location: [facility] ENDOSCOPY;  Service: Gastroenterology;   Laterality: N/A;   ESOPHAGOGASTRODUODENOSCOPY (EGD) WITH PROPOFOL  N/A 08/04/2019   Procedure: ESOPHAGOGASTRODUODENOSCOPY (EGD) WITH PROPOFOL ;  Surgeon: Leigh Elspeth SQUIBB, MD;  Location: [facility] ENDOSCOPY;  Service: Gastroenterology;  Laterality: N/A;   ESOPHAGOGASTRODUODENOSCOPY (EGD) WITH PROPOFOL  N/A 09/13/2021   Procedure: ESOPHAGOGASTRODUODENOSCOPY (EGD) WITH PROPOFOL ;  Surgeon: Wilhelmenia Aloha Raddle., MD;  Location: [facility] ENDOSCOPY;  Service: Gastroenterology;  Laterality: N/A;   GIVENS CAPSULE STUDY N/A 09/23/2021   Procedure: GIVENS CAPSULE STUDY;  Surgeon: San Sandor GAILS, DO;  Location: [facility] ENDOSCOPY;  Service: Gastroenterology;  Laterality: N/A;   graft left arm Left    for dialysis x 2. Removed   HOT HEMOSTASIS  11/18/2020   Procedure: HOT HEMOSTASIS (ARGON PLASMA COAGULATION/BICAP);  Surgeon: Stacia Glendia BRAVO, MD;  Location: [facility] ENDOSCOPY;  Service: Gastroenterology;;   HOT HEMOSTASIS N/A 09/23/2021   Procedure: HOT HEMOSTASIS (ARGON PLASMA COAGULATION/BICAP);  Surgeon: San Sandor GAILS, DO;  Location: [facility] ENDOSCOPY;  Service: Gastroenterology;  Laterality: N/A;   INSERTION OF DIALYSIS CATHETER     Rt chest   LAPAROSCOPIC RIGHT COLECTOMY N/A  08/05/2015   Procedure: LAPAROSCOPIC RIGHT COLECTOMY- ASCENDING;  Surgeon: Jina Nephew, MD;  Location: [facility] OR;  Service: General;  Laterality: N/A;   LOWER EXTREMITY ANGIOGRAPHY N/A 12/09/2023   Procedure: Lower Extremity Angiography;  Surgeon: Sheree Penne Bruckner, MD;  Location: [facility] INVASIVE CV LAB;  Service: Cardiovascular;  Laterality: N/A;   LOWER EXTREMITY INTERVENTION N/A 12/09/2023   Procedure: LOWER EXTREMITY INTERVENTION;  Surgeon: Sheree Penne Bruckner, MD;  Location: [facility] INVASIVE CV LAB;  Service: Cardiovascular;  Laterality: N/A;   MASS EXCISION Left 05/28/2019   Procedure: EXCISION SOFT TISSUE MASS LEFT SHOULDER;  Surgeon: Eletha Boas, MD;  Location: [facility] ORS;  Service: General;  Laterality: Left;   NEPHRECTOMY Bilateral     PARATHYROIDECTOMY N/A 06/12/2016   Procedure: TOTAL PARATHYROIDECTOMY WITH AUTOTRANSPLANTATION TO LEFT FOREARM;  Surgeon: Boas Eletha, MD;  Location: [facility] OR;  Service: General;  Laterality: N/A;   PARATHYROIDECTOMY N/A 10/23/2016   Procedure: PARATHYROIDECTOMY;  Surgeon: Eletha Boas, MD;  Location: [facility] OR;  Service: General;  Laterality: N/A;   REVERSE SHOULDER ARTHROPLASTY Right 08/24/2020   Procedure: REVERSE SHOULDER ARTHROPLASTY;  Surgeon: Cristy Bonner DASEN, MD;  Location: [facility] OR;  Service: Orthopedics;  Laterality: Right;   REVISON OF ARTERIOVENOUS FISTULA Right 07/16/2017   Procedure: REVISION OF ARTERIOVENOUS FISTULA  Right ARM;  Surgeon: Sheree Penne Bruckner, MD;  Location: [facility] OR;  Service: Vascular;  Laterality: Right;   TOTAL HIP ARTHROPLASTY Left 11/14/2020   Procedure: LEFT TOTAL HIP ARTHROPLASTY ANTERIOR APPROACH;  Surgeon: [name] Kay HERO, MD;  Location: [facility] OR;  Service: Orthopedics;  Laterality: Left;   UPPER GASTROINTESTINAL ENDOSCOPY  2021   @ [facility]     Scheduled Meds:  (feeding supplement) PROSource Plus  30 mL Oral BID BM   [START ON 02/21/2024] amoxicillin -clavulanate  1 tablet Oral QHS   aspirin  EC  81 mg Oral Daily   atorvastatin   10 mg Oral Daily   bupivacaine (PF)  10 mL Infiltration Once   [START ON 02/21/2024] Chlorhexidine  Gluconate Cloth  6 each Topical Q0600   cinacalcet   120 mg Oral Q breakfast   ciprofloxacin -dexamethasone   4 drop Left EAR BID   clopidogrel   75 mg Oral Daily   darbepoetin (ARANESP ) injection - DIALYSIS  40 mcg Subcutaneous Q Fri-1800   gabapentin   300 mg Oral QHS   heparin   5,000 Units Subcutaneous Q8H   insulin  aspart  0-6 Units Subcutaneous TID WC   lidocaine   1 patch Transdermal Q24H   lidocaine   1 patch Transdermal Q24H   linezolid   600 mg Oral Q12H   midodrine   10 mg Oral TID WC   multivitamin  1 tablet Oral QHS   pantoprazole   40 mg Oral BID AC   psyllium  1 packet Oral Daily   sevelamer  carbonate  2,400 mg Oral TID WC   sodium chloride   flush  3 mL Intravenous Q12H   sodium zirconium cyclosilicate   5 g Oral Once   Continuous Infusions:  PRN Meds: acetaminophen  **OR** acetaminophen , cyclobenzaprine , diphenhydrAMINE , melatonin, midodrine , oxyCODONE -acetaminophen   Allergies:   Allergies[1]  Social History:   Social History   Socioeconomic History   Marital status: Single    Spouse name: Not on file   Number of children: 2   Years of education: Not on file   Highest education level: Not on file  Occupational History   Occupation: retired    Associate Professor: SELF EMPLOYED  Tobacco Use   Smoking status: Former    Current packs/day: 0.00    Types: Cigarettes  Quit date: 06/19/1990    Years since quitting: 33.6   Smokeless tobacco: Never   Tobacco comments:    rarely cigar  Vaping Use   Vaping status: Never Used  Substance and Sexual Activity   Alcohol use: Not Currently    Alcohol/week: 1.0 standard drink of alcohol    Types: 1 Cans of beer per week    Comment: occasional beer   Drug use: Not Currently    Types: Marijuana    Comment: Last use several yrs ago   Sexual activity: Not Currently  Other Topics Concern   Not on file  Social History Narrative   Lives alone at home with brother   12 years of education    2 children    Not married    Are you right handed or left handed? Rtight   Are you currently employed ? retired   What is your current occupation?   Do you live at home alone?   Who lives with you?    What type of home do you live in: 1 story or 2 story? One   Caffeine  1 cup a day       Social Drivers of Health   Tobacco Use: Medium Risk (02/09/2024)   Patient History    Smoking Tobacco Use: Former    Smokeless Tobacco Use: Never    Passive Exposure: Not on Actuary Strain: Not on file  Food Insecurity: No Food Insecurity (02/10/2024)   Epic    Worried About Programme Researcher, Broadcasting/film/video in the Last Year: Never true    Ran Out of Food in the Last Year: Never true  Transportation  Needs: No Transportation Needs (02/10/2024)   Epic    Lack of Transportation (Medical): No    Lack of Transportation (Non-Medical): No  Physical Activity: Not on file  Stress: Not on file  Social Connections: Socially Isolated (02/10/2024)   Social Connection and Isolation Panel    Frequency of Communication with Friends and Family: Twice a week    Frequency of Social Gatherings with Friends and Family: Twice a week    Attends Religious Services: Never    Database Administrator or Organizations: No    Attends Banker Meetings: Never    Marital Status: Divorced  Catering Manager Violence: Not At Risk (02/10/2024)   Epic    Fear of Current or Ex-Partner: No    Emotionally Abused: No    Physically Abused: No    Sexually Abused: No  Depression (PHQ2-9): Medium Risk (08/19/2023)   Depression (PHQ2-9)    PHQ-2 Score: 10  Alcohol Screen: Not on file  Housing: Low Risk (02/10/2024)   Epic    Unable to Pay for Housing in the Last Year: No    Number of Times Moved in the Last Year: 0    Homeless in the Last Year: No  Utilities: Not At Risk (02/10/2024)   Epic    Threatened with loss of utilities: No  Health Literacy: Not on file    Family History:   Family History  Problem Relation Age of Onset   Diabetes Father    Stroke Father    Hypertension Father    Uterine cancer Mother    Lupus Sister    Stroke Sister    Hypertension Sister    Anuerysm Brother        brain   Colon cancer Neg Hx    Esophageal cancer Neg Hx    Stomach  cancer Neg Hx    Pancreatic cancer Neg Hx    Liver disease Neg Hx    Colon polyps Neg Hx    Rectal cancer Neg Hx      ROS:  Please see the history of present illness.  All other ROS reviewed and negative.     Physical Exam/Data: Vitals:   02/20/24 0551 02/20/24 0831 02/20/24 1415 02/20/24 1652  BP: (!) 88/62 (!) 86/58 100/65 95/60  Pulse: 100 84 82 79  Resp:  20 20 20   Temp:  98.3 F (36.8 C) 97.8 F (36.6 C) 98.3 F (36.8 C)   TempSrc:  Oral  Oral  SpO2: 97% 91% 100% 99%  Weight:      Height:        Intake/Output Summary (Last 24 hours) at 02/20/2024 1701 Last data filed at 02/19/2024 1927 Gross per 24 hour  Intake --  Output 0 ml  Net 0 ml      02/20/2024    5:00 AM 02/19/2024    7:27 PM 02/19/2024    5:00 AM  Last 3 Weights  Weight (lbs) 188 lb 15 oz 188 lb 15 oz 186 lb 1.1 oz  Weight (kg) 85.7 kg 85.7 kg 84.4 kg     Body mass index is 24.93 kg/m.  General:  Well nourished, well developed, in no acute distress HEENT: normal Neck: no JVD Vascular: No carotid bruits; Distal pulses 2+ bilaterally Cardiac:  normal S1, S2; RRR; 2 out of 6 diastolic murmur Lungs:  clear to auscultation bilaterally, no wheezing, rhonchi or rales  Abd: soft, nontender, no hepatomegaly  Ext: no edema Musculoskeletal:  No deformities, BUE and BLE strength normal and equal Skin: warm and dry  Neuro:  CNs 2-12 intact, no focal abnormalities noted Psych:  Normal affect   EKG:  The EKG was personally reviewed and demonstrates: Get EKG Telemetry:  Telemetry was personally reviewed and demonstrates: Sinus  Relevant CV Studies: Echocardiogram 02/20/2024  1. Left ventricular ejection fraction, by estimation, is 65 to 70%. The  left ventricle has normal function. The left ventricle has no regional  wall motion abnormalities. There is mild left ventricular hypertrophy.  Left ventricular diastolic parameters  were normal.   2. Right ventricular systolic function is low normal. The right  ventricular size is normal. There is normal pulmonary artery systolic  pressure.   3. Restricted posterior leaflet mobility of the mitral valve leaflets.   4. The mitral valve is abnormal. Trivial mitral valve regurgitation.  Moderate to severe mitral annular calcification.   5. 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 The aortic  valve is calcified. There is moderate calcification of the aortic valve.  There is moderate thickening  of the aortic valve. Aortic valve  regurgitation is mild to moderate. Aortic valve   sclerosis/calcification is present, without any evidence of aortic  stenosis.   6. The inferior vena cava is normal in size with greater than 50%  respiratory variability, suggesting right atrial pressure of 3 mmHg.   Comparison(s): Prior images unable to be directly viewed, comparison made  by report only.   Conclusion(s)/Recommendation(s): Small linear echodensity noted on  ventricular aspect of aortic valve. Lambl's excrescence noted on prior  outside echoes. Appearance supports Lambl's, but if there is clinical  concern for endocarditis despite negative  blood cultures, then TEE would be appropriate.   Laboratory Data: High Sensitivity Troponin:  No results for input(s): TROPONINIHS in the last 720 hours. No results for input(s):  TRNPT in the last 720 hours.    Chemistry Recent Labs  Lab 02/18/24 1823 02/19/24 0256 02/20/24 0332  NA 133* 132* 136  K 5.2* 5.8* 4.8  CL 89* 87* 92*  CO2 27 24 29   GLUCOSE 112* 99 76  BUN 49* 60* 36*  CREATININE 7.91* 8.54* 5.62*  CALCIUM  9.2 9.3 9.4  GFRNONAA 7* 6* 11*  ANIONGAP 17* 20* 15    Recent Labs  Lab 02/18/24 1823 02/19/24 0256 02/20/24 0332  ALBUMIN  3.2* 2.9* 3.1*   Lipids No results for input(s): CHOL, TRIG, HDL, LABVLDL, LDLCALC, CHOLHDL in the last 168 hours.  Hematology Recent Labs  Lab 02/18/24 1823  WBC 9.0  RBC 3.41*  HGB 9.9*  HCT 30.2*  MCV 88.6  MCH 29.0  MCHC 32.8  RDW 15.6*  PLT 501*   Thyroid  No results for input(s): TSH, FREET4 in the last 168 hours.  BNPNo results for input(s): BNP, PROBNP in the last 168 hours.  DDimer No results for input(s): DDIMER in the last 168 hours.  Radiology/Studies:  ECHOCARDIOGRAM COMPLETE Result Date: 02/20/2024    ECHOCARDIOGRAM REPORT   Patient Name:   Carl Vangieson Sr. Date of Exam: 02/20/2024 Medical Rec #:  [id number]           Height:       73.0 in  Accession #:    [id number]          Weight:       188.9 lb Date of Birth:  1961/01/06            BSA:          2.100 m Patient Age:    63 years            BP:           86/58 mmHg Patient Gender: M                   HR:           88 bpm. Exam Location:  Inpatient Procedure: 2D Echo, Cardiac Doppler and Color Doppler (Both Spectral and Color            Flow Doppler were utilized during procedure). Indications:    Pre-operative cardiovascular examination  History:        Patient has prior history of Echocardiogram examinations, most                 recent 10/03/2015. Risk Factors:Dyslipidemia, Hypertension and                 Sleep Apnea.  Sonographer:    Sherlean Dubin Referring Phys: [phone] BELKYS A REGALADO IMPRESSIONS  1. Left ventricular ejection fraction, by estimation, is 65 to 70%. The left ventricle has normal function. The left ventricle has no regional wall motion abnormalities. There is mild left ventricular hypertrophy. Left ventricular diastolic parameters were normal.  2. Right ventricular systolic function is low normal. The right ventricular size is normal. There is normal pulmonary artery systolic pressure.  3. Restricted posterior leaflet mobility of the mitral valve leaflets.  4. The mitral valve is abnormal. Trivial mitral valve regurgitation. Moderate to severe mitral annular calcification.  5. 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 The aortic valve is calcified. There is moderate calcification of the aortic valve. There is moderate thickening of the aortic valve. Aortic valve regurgitation is mild to moderate. Aortic valve  sclerosis/calcification is present, without any evidence of aortic stenosis.  6.  The inferior vena cava is normal in size with greater than 50% respiratory variability, suggesting right atrial pressure of 3 mmHg. Comparison(s): Prior images unable to be directly viewed, comparison made by report only. Conclusion(s)/Recommendation(s): Small linear echodensity  noted on ventricular aspect of aortic valve. 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 FINDINGS  Left Ventricle: Left ventricular ejection fraction, by estimation, is 65 to 70%. The left ventricle has normal function. The left ventricle has no regional wall motion abnormalities. The left ventricular internal cavity size was normal in size. There is  mild left ventricular hypertrophy. Left ventricular diastolic parameters were normal. Right Ventricle: The right ventricular size is normal. Right vetricular wall thickness was not well visualized. Right ventricular systolic function is low normal. There is normal pulmonary artery systolic pressure. The tricuspid regurgitant velocity is 1.86 m/s, and with an assumed right atrial pressure of 3 mmHg, the estimated right ventricular systolic pressure is 16.8 mmHg. Left Atrium: Left atrial size was normal in size. Right Atrium: Right atrial size was normal in size. Pericardium: There is no evidence of pericardial effusion. Mitral Valve: Calcified subvalvular apparatus with appearance of small ruptured chordae tendinae. The mitral valve is abnormal. There is moderate thickening of the mitral valve leaflet(s). There is moderate calcification of the mitral valve leaflet(s). Restricted posterior leaflet mobility of the mitral valve leaflets. Moderate to severe mitral annular calcification. Trivial mitral valve regurgitation. MV peak gradient, 10.6 mmHg. The mean mitral valve gradient is 5.0 mmHg. Tricuspid Valve: The tricuspid valve is grossly normal. Tricuspid valve regurgitation is trivial. No evidence of tricuspid stenosis. 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 The aortic valve is calcified. There is moderate calcification of the aortic valve. There is moderate thickening of the aortic valve.  Aortic valve regurgitation is mild to moderate. Aortic valve sclerosis/calcification is present, without any evidence of aortic stenosis. Aortic valve peak gradient measures 13.0 mmHg. Pulmonic Valve: The pulmonic valve was not well visualized. Pulmonic valve regurgitation is not visualized. No evidence of pulmonic stenosis. Aorta: The aortic root is normal in size and structure. Venous: The inferior vena cava is normal in size with greater than 50% respiratory variability, suggesting right atrial pressure of 3 mmHg. IAS/Shunts: The atrial septum is grossly normal.  LEFT VENTRICLE PLAX 2D LVIDd:         4.00 cm     Diastology LVIDs:         2.80 cm     LV e' medial:    6.20 cm/s LV PW:         0.90 cm     LV E/e' medial:  14.4 LV IVS:        1.40 cm     LV e' lateral:   13.10 cm/s LVOT diam:     2.23 cm     LV E/e' lateral: 6.8 LV SV:         97 LV SV Index:   46 LVOT Area:     3.91 cm  LV Volumes (MOD) LV vol d, MOD A2C: 99.9 ml LV vol d, MOD A4C: 81.2 ml LV vol s, MOD A2C: 25.4 ml LV vol s, MOD A4C: 23.5 ml LV SV MOD A2C:     74.5 ml LV SV MOD A4C:     81.2 ml LV SV MOD BP:      66.5 ml  RIGHT VENTRICLE            IVC RV Basal diam:  3.92 cm    IVC diam: 1.38 cm RV Mid diam:    3.59 cm RV S prime:     9.79 cm/s TAPSE (M-mode): 1.3 cm LEFT ATRIUM             Index        RIGHT ATRIUM           Index LA diam:        3.71 cm 1.77 cm/m   RA Area:     18.30 cm LA Vol (A2C):   61.1 ml 29.09 ml/m  RA Volume:   58.30 ml  27.76 ml/m LA Vol (A4C):   38.1 ml 18.14 ml/m LA Biplane Vol: 49.8 ml 23.71 ml/m  AORTIC VALVE AV Area (Vmax): 2.78 cm AV Vmax:        180.00 cm/s AV Peak Grad:   13.0 mmHg LVOT Vmax:      128.00 cm/s LVOT Vmean:     87.800 cm/s LVOT VTI:       0.248 m  AORTA Ao Root diam: 3.02 cm MITRAL VALVE                TRICUSPID VALVE MV Area (PHT): 3.60 cm     TR Peak grad:   13.8 mmHg MV Area VTI:   2.61 cm     TR Vmax:        186.00 cm/s MV Peak grad:  10.6 mmHg MV Mean grad:  5.0 mmHg     SHUNTS MV  Vmax:       1.63 m/s     Systemic VTI:  0.25 m MV Vmean:      111.0 cm/s   Systemic Diam: 2.23 cm MV Decel Time: 211 msec MV E velocity: 89.10 cm/s MV A velocity: 128.00 cm/s MV E/A ratio:  0.70 Shelda Bruckner MD Electronically signed by Shelda Bruckner MD Signature Date/Time: 02/20/2024/2:18:13 PM    Final      Assessment and Plan:   Preoperative evaluation Tentative plans for knee arthroscopy tomorrow for septic arthritis.  Prior ischemic evaluation 10/2020 with low risk Myoview .  Echocardiogram this admission with preserved biventricular function.  RCRI 0.5%.  Unable to assess METS given limited mobility and walker dependent.  However reports no exertional symptoms or other cardiac related symptoms that would prompt further evaluation at this time.  Overall procedure is of low cardiovascular risk.  He is likely intermediate risk given persistent issues of chronic hypotension throughout this admission and comorbid conditions.  Attempted to call anesthesia, he has not had any evaluation so unclear of the type of anesthesia he is going to receive. Will defer to MD and anesthesiologist about preferred anesthesia. Chronically on DAPT given PAD and recent intervention 11/2023.  Defer to vascular surgery about DAPT. Get EKG. Be mindful of hypotension during procedure.  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  This was again noted during this admission's echocardiogram.  There was a linear mass seen on the ventricular aspect of the aortic valve.  Reported mild to moderate AR.  Will review echocardiogram results with MD but likely unchanged.  Either way he has had negative blood cultures throughout this admission and overall afebrile.  He has some relative contraindications to TEE with his hypotension and cervical spine interventions that make TEE in the acute setting less than ideal.  Eventually could consider but not a strong  indication to perform this  prior to his procedure or during hospitalization if he remains to have negative blood cultures and afebrile. Could consider repeating another set of blood cultures.  PAD Follow-up with vascular surgery recommendations regarding DAPT.  Continue DAPT therapy with aspirin  and Plavix  and statin.  ESRD Chronic hypotension Required midodrine  10 mg 3 times daily.  Sepsis due to otitis media with TM perforation Septic arthritis Management per primary team.  Right-sided weakness History of cervical decompression corpectomy C4 No new spinal cord injury.  Felt to be a recrudescence of old symptoms.   Risk Assessment/Risk Scores:  For questions or updates, please contact [facility] Please consult [email or web address] for contact info under     Signed, Thom LITTIE Sluder, PA-C  02/20/2024 5:01 PM      [1]  Allergies Allergen Reactions   Infed [Iron Dextran] Other (See Comments)    Decreased BP    Vicodin [Hydrocodone -Acetaminophen ] Other (See Comments)    Decreased BP   Hydrocodone  Other (See Comments)    Unable to recall reaction   Voltaren [Diclofenac] Other (See Comments)    Hypotension    "

## 2024-02-20 NOTE — Progress Notes (Signed)
 " PROGRESS NOTE    Carl Luffman Sr.  FMW:[id number] DOB: 16-Dec-1960 DOA: 02/08/2024 PCP: [name], MD   Brief Narrative: 64 year old with past medical history significant for ESRD on hemodialysis MWF, PVD s/p angioplasty right S FA October 2025 for ischemic rest pain as well as C4 spinal fracture April 2025 with incomplete spinal cord injury requiring C4 corpectomy, fusion and interbody cage by Dr. [name], wheelchair-bound for a time but progressed by Agurs to walking with a cane and driving, presented with right arm and leg weakness, fall as well as posterior neck pain, fever, ear drainage.  Admitted started on antibiotics, neurosurgery, neurology orthopedic ENT and nephrology consulted.  Mildly diagnosed with otitis media and ruptured eardrum.  Right-sided weakness believed to be recrudescence of old spinal cord injury symptoms in the setting of infection.  Denied  CIR placement by insurance.  Knee synovial fluid consistent with septic arthritis, ortho panning arthroscopic  I and d.    Assessment & Plan:   Principal Problem:   Sepsis (HCC) Active Problems:   GERD (gastroesophageal reflux disease)   HLD (hyperlipidemia)   Essential hypertension   Diarrhea, unspecified   ESRD (end stage renal disease) (HCC)   Obstructive sleep apnea   Type 2 diabetes mellitus with diabetic peripheral angiopathy without gangrene (HCC)   Acute suppurative otitis media of left ear with spontaneous rupture of tympanic membrane   Mastoid disorder, bilateral   Conductive hearing loss   Acute right-sided weakness   1-Sepsis due to otitis media with suspected TM perforation - Patient met sepsis criteria on admission suspected due to suppurative otitis media.  Blood cultures no growth to date.  COVID flu RSV negative - ENT [name] was consulted and patient underwent CT of the temporal bones without evidence of coalescent mastoiditis, bone breakdown or abscess -He completed 10 days of antibiotics  for otitis.  - Patient had low-grade fever 1/6. No further fever since yesterday. If spike fever will need repeat blood cultures, workup -needs follow up with ENT on January 5 -- 12th. - Continue with  Ciprodex  drops, ENT recommend it for 14 days.  Resume antibiotics also to cover knee infection.   Right-sided weakness History of cervical decompression corpectomy C4 by [name] April 2025 Remote left occipital infarct - There was some concern for recurrent spinal cord injury, neurosurgery and neurology were consulted.  A spinal MRI showed no new cord injury. - It was thought that his symptoms were recrudescence of old  symptoms due to  acute infection.   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  Had knee aspiration and injection on  1/06  -knee synovial fluid, no growth to date.  Synovial fluid notice to be 71,000.  IV antibiotics order, patient refuse IV access, he understand risk of infection , he would only take oral antibiotics. Started Linezolid  and Augmentin .    -Peripheral vascular disease s/p angioplasty October 2025; - Continue aspirin  Lipitor and Plavix   ESRD on hemodialysis: MWF - Nephrology following.  Due for dialysis today Hyperkalemia, treated with HD  Chronic hypotension, asymptomatic -Continue midodrine  Refuse meds at times.  Plan to check echo for completeness and anticipation of sx.   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   Weight as of this encounter: 85.7 kg.   DVT prophylaxis: Heparin  Code Status: Full code Family Communication: Care discussed with patient Disposition Plan:  Status is: Inpatient Remains inpatient appropriate because: Awaiting skilled nursing facility    Consultants:  Nephrology ENT Neurology  Procedures:  HD  Antimicrobials:    Subjective: Long conversation about importance of compliance with midodrine .  Also we discussed my recommendation to resume IV antibiotics, he decline, he said they couldn't get IV access. I offer central line by CCM or IR, he refuse. Explain to him risk of worsening infection.   Objective: Vitals:   02/19/24 2018 02/20/24 0015 02/20/24 0500 02/20/24 0551  BP: 93/63 (!) 85/56  (!) 88/62  Pulse: 95 90  100  Resp:      Temp:      TempSrc:      SpO2: 100% 97%  97%  Weight:   85.7 kg   Height:        Intake/Output Summary (Last 24 hours) at 02/20/2024 0732 Last data filed at 02/19/2024 1927 Gross per 24 hour  Intake 240 ml  Output 0 ml  Net 240 ml   Filed Weights   02/19/24 0500 02/19/24 1927 02/20/24 0500  Weight: 84.4 kg 85.7 kg 85.7 kg    Examination:  General exam: NAD, alert, conversant Respiratory system: CTA Cardiovascular system: S 1, S 2 RRR Gastrointestinal system: BS present, soft, nt Central nervous system: alert Extremities: Symmetric 5 x 5 power.    Data Reviewed: I have personally reviewed following labs and imaging studies  CBC: Recent Labs  Lab 02/13/24 0746 02/18/24 1823  WBC 9.1 9.0  NEUTROABS  --  7.2  HGB 10.0* 9.9*  HCT 29.8* 30.2*  MCV 87.6 88.6  PLT 353 501*   Basic Metabolic Panel: Recent Labs  Lab 02/16/24 0545 02/17/24 0447 02/18/24 1823 02/19/24 0256 02/20/24 0332  NA 128* 129* 133* 132* 136  K 4.2 5.6* 5.2* 5.8* 4.8  CL 68* 86* 89* 87* 92*  CO2 19* 23 27 24 29   GLUCOSE 376* 80 112* 99 76  BUN 53* 80* 49* 60* 36*  CREATININE 7.19* 11.00* 7.91* 8.54*  5.62*  CALCIUM  7.8* 9.2 9.2 9.3 9.4  PHOS 4.1 5.8* 5.2* 6.5* 4.5   GFR: Estimated Creatinine Clearance: 14.8 mL/min (A) (by C-G formula based on SCr of 5.62 mg/dL (H)). Liver Function Tests: Recent Labs  Lab 02/16/24 0545 02/17/24 0447 02/18/24 1823 02/19/24 0256 02/20/24 0332  ALBUMIN  2.7* 2.9* 3.2* 2.9* 3.1*   No results for input(s): LIPASE, AMYLASE in the last 168 hours. No results for input(s): AMMONIA in the last 168 hours. Coagulation Profile: No results for input(s): INR, PROTIME in the last 168 hours. Cardiac Enzymes: No results for input(s): CKTOTAL, CKMB, CKMBINDEX, TROPONINI in the last 168 hours. BNP (last 3 results) No results for input(s): PROBNP in the last 8760 hours. HbA1C: No results for input(s): HGBA1C in the last 72 hours. CBG: Recent Labs  Lab 02/19/24 0736 02/19/24 0757 02/19/24 1154 02/19/24 1446 02/20/24 0329  GLUCAP 30* 95 103* 87 89   Lipid Profile: No results for input(s): CHOL, HDL, LDLCALC, TRIG, CHOLHDL, LDLDIRECT in the last 72 hours. Thyroid  Function Tests: No results for input(s): TSH, T4TOTAL, FREET4, T3FREE, THYROIDAB in the  last 72 hours. Anemia Panel: No results for input(s): VITAMINB12, FOLATE, FERRITIN, TIBC, IRON, RETICCTPCT in the last 72 hours. Sepsis Labs: Recent Labs  Lab 02/18/24 1823  LATICACIDVEN 1.2    Recent Results (from the past 240 hours)  Body fluid culture w Gram Stain     Status: None (Preliminary result)   Collection Time: 02/18/24  2:24 PM   Specimen: Synovium; Body Fluid  Result Value Ref Range Status   Specimen Description SYNOVIAL  Final   Special Requests RIGHT KNEE  Final   Gram Stain   Final    ABUNDANT WBC PRESENT, PREDOMINANTLY PMN NO ORGANISMS SEEN    Culture   Final    NO GROWTH < 24 HOURS Performed at [facility], [address] [place], [name]    Report Status PENDING  Incomplete         Radiology  Studies: No results found.      Scheduled Meds:  (feeding supplement) PROSource Plus  30 mL Oral BID BM   amoxicillin -clavulanate  1 tablet Oral Once   aspirin  EC  81 mg Oral Daily   atorvastatin   10 mg Oral Daily   bupivacaine (PF)  10 mL Infiltration Once   Chlorhexidine  Gluconate Cloth  6 each Topical Q0600   cinacalcet   120 mg Oral Q breakfast   ciprofloxacin -dexamethasone   4 drop Left EAR BID   clopidogrel   75 mg Oral Daily   darbepoetin (ARANESP ) injection - DIALYSIS  40 mcg Subcutaneous Q Fri-1800   gabapentin   300 mg Oral QHS   heparin   5,000 Units Subcutaneous Q8H   insulin  aspart  0-6 Units Subcutaneous TID WC   lidocaine   1 patch Transdermal Q24H   lidocaine   1 patch Transdermal Q24H   midodrine   10 mg Oral TID WC   multivitamin  1 tablet Oral QHS   pantoprazole   40 mg Oral BID AC   psyllium  1 packet Oral Daily   sevelamer  carbonate  2,400 mg Oral TID WC   sodium chloride  flush  3 mL Intravenous Q12H   sodium zirconium cyclosilicate   5 g Oral Once   Continuous Infusions:   LOS: 9 days    Time spent: 35 Minutes    Irvan Tiedt A Finnlee Silvernail, MD [facility]   If 7PM-7AM, please contact night-coverage [email or web address]  02/20/2024, 7:32 AM   "

## 2024-02-20 NOTE — Plan of Care (Signed)
 Patient continues to refuse labs. Patient educated on diabetes management, care plan and significance of labwork. Patient had labwork completed. Patient left with call bell in reach and bed in lowest position.   Problem: Education: Goal: Knowledge of General Education information will improve Description: Including pain rating scale, medication(s)/side effects and non-pharmacologic comfort measures Outcome: Progressing   Problem: Clinical Measurements: Goal: Ability to maintain clinical measurements within normal limits will improve Outcome: Progressing   Problem: Activity: Goal: Risk for activity intolerance will decrease Outcome: Progressing   Problem: Nutrition: Goal: Adequate nutrition will be maintained Outcome: Progressing   Problem: Coping: Goal: Level of anxiety will decrease Outcome: Progressing   Problem: Elimination: Goal: Will not experience complications related to bowel motility Outcome: Progressing   Problem: Pain Managment: Goal: General experience of comfort will improve and/or be controlled Outcome: Progressing

## 2024-02-20 NOTE — Progress Notes (Signed)
 Called patient and discussed results of knee aspiration concerning for septic arthritis. I have recommend arthroscopic I&D ASAP.  Detailed surgical discussion was had.  Questions answered.  NPO after midnight.  Surgery scheduled for tomorrow at noon.  GEANNIE Ozell Cummins, MD [facility] 11:35 AM

## 2024-02-21 ENCOUNTER — Inpatient Hospital Stay (HOSPITAL_COMMUNITY)

## 2024-02-21 ENCOUNTER — Encounter (HOSPITAL_COMMUNITY)
Admission: EM | Disposition: A | Discharge status: Rehabilitation Facility | Payer: Self-pay | Source: Home / Self Care | Attending: Internal Medicine

## 2024-02-21 ENCOUNTER — Encounter (HOSPITAL_COMMUNITY): Payer: Self-pay | Admitting: Family Medicine

## 2024-02-21 DIAGNOSIS — M009 Pyogenic arthritis, unspecified: Secondary | ICD-10-CM | POA: Diagnosis present

## 2024-02-21 DIAGNOSIS — Z87891 Personal history of nicotine dependence: Secondary | ICD-10-CM | POA: Diagnosis not present

## 2024-02-21 DIAGNOSIS — I10 Essential (primary) hypertension: Secondary | ICD-10-CM

## 2024-02-21 DIAGNOSIS — H66012 Acute suppurative otitis media with spontaneous rupture of ear drum, left ear: Secondary | ICD-10-CM | POA: Diagnosis not present

## 2024-02-21 DIAGNOSIS — M00061 Staphylococcal arthritis, right knee: Secondary | ICD-10-CM | POA: Diagnosis not present

## 2024-02-21 DIAGNOSIS — F418 Other specified anxiety disorders: Secondary | ICD-10-CM

## 2024-02-21 HISTORY — PX: KNEE ARTHROSCOPY: SHX127

## 2024-02-21 LAB — GLUCOSE, CAPILLARY
Glucose-Capillary: 83 mg/dL (ref 70–99)
Glucose-Capillary: 79 mg/dL (ref 70–99)
Glucose-Capillary: 76 mg/dL (ref 70–99)
Glucose-Capillary: 101 mg/dL — ABNORMAL HIGH (ref 70–99)
Glucose-Capillary: 143 mg/dL — ABNORMAL HIGH (ref 70–99)

## 2024-02-21 LAB — CBC
HCT: 33.5 % — ABNORMAL LOW (ref 39.0–52.0)
Hemoglobin: 10.4 g/dL — ABNORMAL LOW (ref 13.0–17.0)
MCH: 29.1 pg (ref 26.0–34.0)
MCHC: 31 g/dL (ref 30.0–36.0)
MCV: 93.8 fL (ref 80.0–100.0)
Platelets: 517 K/uL — ABNORMAL HIGH (ref 150–400)
RBC: 3.57 MIL/uL — ABNORMAL LOW (ref 4.22–5.81)
RDW: 15.9 % — ABNORMAL HIGH (ref 11.5–15.5)
WBC: 7.2 K/uL (ref 4.0–10.5)
nRBC: 0 % (ref 0.0–0.2)

## 2024-02-21 LAB — POCT I-STAT, CHEM 8
BUN: 32 mg/dL — ABNORMAL HIGH (ref 8–23)
Calcium, Ion: 1.03 mmol/L — ABNORMAL LOW (ref 1.15–1.40)
Chloride: 93 mmol/L — ABNORMAL LOW (ref 98–111)
Creatinine, Ser: 4.5 mg/dL — ABNORMAL HIGH (ref 0.61–1.24)
Glucose, Bld: 76 mg/dL (ref 70–99)
HCT: 28 % — ABNORMAL LOW (ref 39.0–52.0)
Hemoglobin: 9.5 g/dL — ABNORMAL LOW (ref 13.0–17.0)
Potassium: 4.1 mmol/L (ref 3.5–5.1)
Sodium: 134 mmol/L — ABNORMAL LOW (ref 135–145)
TCO2: 31 mmol/L (ref 22–32)

## 2024-02-21 LAB — RENAL FUNCTION PANEL
Albumin: 2.9 g/dL — ABNORMAL LOW (ref 3.5–5.0)
Anion gap: 19 — ABNORMAL HIGH (ref 5–15)
BUN: 59 mg/dL — ABNORMAL HIGH (ref 8–23)
CO2: 24 mmol/L (ref 22–32)
Calcium: 8.9 mg/dL (ref 8.9–10.3)
Chloride: 90 mmol/L — ABNORMAL LOW (ref 98–111)
Creatinine, Ser: 7.62 mg/dL — ABNORMAL HIGH (ref 0.61–1.24)
GFR, Estimated: 7 mL/min — ABNORMAL LOW
Glucose, Bld: 84 mg/dL (ref 70–99)
Phosphorus: 5.7 mg/dL — ABNORMAL HIGH (ref 2.5–4.6)
Potassium: 5.5 mmol/L — ABNORMAL HIGH (ref 3.5–5.1)
Sodium: 133 mmol/L — ABNORMAL LOW (ref 135–145)

## 2024-02-21 LAB — NO BLOOD PRODUCTS

## 2024-02-21 MED ORDER — HEPARIN SODIUM (PORCINE) 1000 UNIT/ML DIALYSIS: 1000.0000 [IU] | INTRAMUSCULAR | Status: DC | PRN

## 2024-02-21 MED ORDER — MIDAZOLAM HCL (PF) 2 MG/2ML IJ SOLN: 0.5000 mg | Freq: Once | INTRAMUSCULAR | Status: DC | PRN

## 2024-02-21 MED ORDER — DEXAMETHASONE SOD PHOSPHATE PF 10 MG/ML IJ SOLN
INTRAMUSCULAR | Status: AC
  Filled 2024-02-21: qty 1

## 2024-02-21 MED ORDER — SODIUM CHLORIDE 0.9 % IV SOLN: INTRAVENOUS | Status: DC

## 2024-02-21 MED ORDER — ONDANSETRON HCL 4 MG/2ML IJ SOLN
INTRAMUSCULAR | Status: DC | PRN
  Administered 2024-02-21: 4 mg via INTRAVENOUS

## 2024-02-21 MED ORDER — LIDOCAINE 2% (20 MG/ML) 5 ML SYRINGE
INTRAMUSCULAR | Status: AC
  Filled 2024-02-21: qty 5

## 2024-02-21 MED ORDER — HEPARIN SODIUM (PORCINE) 1000 UNIT/ML IJ SOLN
INTRAMUSCULAR | Status: AC
  Filled 2024-02-21: qty 3

## 2024-02-21 MED ORDER — BUPIVACAINE HCL (PF) 0.25 % IJ SOLN
INTRAMUSCULAR | Status: AC
  Filled 2024-02-21: qty 30

## 2024-02-21 MED ORDER — CEFAZOLIN SODIUM-DEXTROSE 2-4 GM/100ML-% IV SOLN
2.0000 g | INTRAVENOUS | Status: DC
  Filled 2024-02-21: qty 100

## 2024-02-21 MED ORDER — PROPOFOL 10 MG/ML IV BOLUS
INTRAVENOUS | Status: DC | PRN
  Administered 2024-02-21: 60 mg via INTRAVENOUS
  Administered 2024-02-21: 100 mg via INTRAVENOUS

## 2024-02-21 MED ORDER — LIDOCAINE 2% (20 MG/ML) 5 ML SYRINGE
INTRAMUSCULAR | Status: DC | PRN
  Administered 2024-02-21: 60 mg via INTRAVENOUS

## 2024-02-21 MED ORDER — HYDROMORPHONE HCL 1 MG/ML IJ SOLN
INTRAMUSCULAR | Status: AC
  Filled 2024-02-21: qty 1

## 2024-02-21 MED ORDER — CHLORHEXIDINE GLUCONATE 0.12 % MT SOLN: 15.0000 mL | Freq: Once | OROMUCOSAL | Status: AC

## 2024-02-21 MED ORDER — PROPOFOL 10 MG/ML IV BOLUS
INTRAVENOUS | Status: AC
  Filled 2024-02-21: qty 20

## 2024-02-21 MED ORDER — BUPIVACAINE HCL (PF) 0.25 % IJ SOLN
INTRAMUSCULAR | Status: DC | PRN
  Administered 2024-02-21: 10 mL

## 2024-02-21 MED ORDER — PHENYLEPHRINE 80 MCG/ML (10ML) SYRINGE FOR IV PUSH (FOR BLOOD PRESSURE SUPPORT)
PREFILLED_SYRINGE | INTRAVENOUS | Status: DC | PRN
  Administered 2024-02-21 (×2): 160 ug via INTRAVENOUS

## 2024-02-21 MED ORDER — OXYCODONE HCL 5 MG/5ML PO SOLN: 5.0000 mg | Freq: Once | ORAL | Status: DC | PRN

## 2024-02-21 MED ORDER — ONDANSETRON HCL 4 MG/2ML IJ SOLN
INTRAMUSCULAR | Status: AC
  Filled 2024-02-21: qty 2

## 2024-02-21 MED ORDER — CHLORHEXIDINE GLUCONATE 0.12 % MT SOLN
15.0000 mL | Freq: Once | OROMUCOSAL | Status: AC
  Administered 2024-02-21: 15 mL via OROMUCOSAL

## 2024-02-21 MED ORDER — CHLORHEXIDINE GLUCONATE 4 % EX SOLN: 60.0000 mL | Freq: Once | CUTANEOUS | Status: DC

## 2024-02-21 MED ORDER — ORAL CARE MOUTH RINSE: 15.0000 mL | Freq: Once | OROMUCOSAL | Status: AC

## 2024-02-21 MED ORDER — PENTAFLUOROPROP-TETRAFLUOROETH EX AERO: 1.0000 | INHALATION_SPRAY | CUTANEOUS | Status: DC | PRN

## 2024-02-21 MED ORDER — OXYCODONE HCL 5 MG PO TABS: 5.0000 mg | ORAL_TABLET | Freq: Once | ORAL | Status: DC | PRN

## 2024-02-21 MED ORDER — CEFAZOLIN SODIUM-DEXTROSE 1-4 GM/50ML-% IV SOLN
INTRAVENOUS | Status: DC | PRN
  Administered 2024-02-21 (×2): 1 g via INTRAVENOUS

## 2024-02-21 MED ORDER — PHENYLEPHRINE 80 MCG/ML (10ML) SYRINGE FOR IV PUSH (FOR BLOOD PRESSURE SUPPORT)
PREFILLED_SYRINGE | INTRAVENOUS | Status: AC
  Filled 2024-02-21: qty 10

## 2024-02-21 MED ORDER — HEPARIN SODIUM (PORCINE) 1000 UNIT/ML DIALYSIS
2500.0000 [IU] | Freq: Once | INTRAMUSCULAR | Status: AC
  Administered 2024-02-21: 2500 [IU] via INTRAVENOUS_CENTRAL

## 2024-02-21 MED ORDER — DEXAMETHASONE SOD PHOSPHATE PF 10 MG/ML IJ SOLN
INTRAMUSCULAR | Status: DC | PRN
  Administered 2024-02-21: 4 mg via INTRAVENOUS

## 2024-02-21 MED ORDER — FENTANYL CITRATE (PF) 100 MCG/2ML IJ SOLN
INTRAMUSCULAR | Status: AC
  Filled 2024-02-21: qty 2

## 2024-02-21 MED ORDER — HYDROMORPHONE HCL 1 MG/ML IJ SOLN
0.2500 mg | INTRAMUSCULAR | Status: DC | PRN
  Administered 2024-02-21 (×2): 0.5 mg via INTRAVENOUS

## 2024-02-21 MED ORDER — POVIDONE-IODINE 10 % EX SWAB
2.0000 | Freq: Once | CUTANEOUS | Status: AC
  Administered 2024-02-21: 2 via TOPICAL

## 2024-02-21 MED ORDER — FENTANYL CITRATE (PF) 100 MCG/2ML IJ SOLN
INTRAMUSCULAR | Status: DC | PRN
  Administered 2024-02-21 (×2): 50 ug via INTRAVENOUS

## 2024-02-21 NOTE — Progress Notes (Signed)
 PT Cancellation Note  Patient Details Name: Carl Mcfayden Sr. MRN: [id number] DOB: 07/27/1960   Cancelled Treatment:    Reason Eval/Treat Not Completed: Patient at procedure or test/unavailable (Pt currently in HD and planned arthroscopy of the R knee for septic joint today. Will follow up as able and appropriate.)  [name], DPT, CLT  Acute Rehabilitation Services Office: [phone] (Secure chat preferred)   [name] 02/21/2024, 10:13 AM

## 2024-02-21 NOTE — H&P (Signed)

## 2024-02-21 NOTE — Progress Notes (Signed)
 Per Melodye, RN, the pt is currently in dialysis. Tish form the OR desk made aware that the will be brought to Pre-Op following his session. Dialysis department made aware also, to have the pt brought to Pre-Op.

## 2024-02-21 NOTE — Procedures (Signed)
 HD Note:  Some information was entered later than the data was gathered due to patient care needs. The stated time with the data is accurate.  Received patient in bed to unit.   Alert and oriented.   Informed consent signed and in chart.   Access used: Right upper arm fistula Access issues: None  Patient tolerated treatment well.   TX duration: 3.5 hours  Alert, without acute distress.  Total UF removed: Patient kept even per dialysis orders.  No fluid removed or given during treatment   Transported by this writer to pre-op for procedure.   Brylei Pedley L. Lenon, RN Kidney Dialysis Unit.

## 2024-02-21 NOTE — Progress Notes (Signed)
 " PROGRESS NOTE    Carl Thwaites Sr.  FMW:[id number] DOB: [birth year] DOA: 02/08/2024 PCP: [name], MD   Brief Narrative: 63 year old with past medical history significant for ESRD on hemodialysis MWF, PVD s/p angioplasty right S FA October 2025 for ischemic rest pain as well as C4 spinal fracture April 2025 with incomplete spinal cord injury requiring C4 corpectomy, fusion and interbody cage by Dr. [name], wheelchair-bound for a time but progressed by Agurs to walking with a cane and driving, presented with right arm and leg weakness, fall as well as posterior neck pain, fever, ear drainage.  Admitted started on antibiotics, neurosurgery, neurology orthopedic ENT and nephrology consulted.  Mildly diagnosed with otitis media and ruptured eardrum.  Right-sided weakness believed to be recrudescence of old spinal cord injury symptoms in the setting of infection.  Denied  CIR placement by insurance.  Knee synovial fluid consistent with septic arthritis, underwent irrigation and debridement by Dr. [name] on 1/9   Assessment & Plan:   Principal Problem:   Sepsis (HCC) Active Problems:   GERD (gastroesophageal reflux disease)   HLD (hyperlipidemia)   Essential hypertension   Diarrhea, unspecified   ESRD (end stage renal disease) (HCC)   Obstructive sleep apnea   Type 2 diabetes mellitus with diabetic peripheral angiopathy without gangrene (HCC)   Acute suppurative otitis media of left ear with spontaneous rupture of tympanic membrane   Mastoid disorder, bilateral   Conductive hearing loss   Acute right-sided weakness   1-Sepsis due to otitis media with suspected TM perforation - Patient met sepsis criteria on admission suspected due to suppurative otitis media.  Blood cultures no growth to date.  COVID flu RSV negative - ENT [name] was consulted and patient underwent CT of the temporal bones without evidence of coalescent mastoiditis, bone breakdown or abscess -He completed 10  days of antibiotics for otitis.  - Patient had low-grade fever 1/6. No further fever since yesterday. If spike fever will need repeat blood cultures, workup -needs follow up with ENT on January 5 -- 12th. - Continue with  Ciprodex  drops, ENT recommend it for 14 days.  Resume antibiotics to cover knee infection.   Right-sided weakness History of cervical decompression corpectomy C4 by [name] April 2025 Remote left occipital infarct - There was some concern for recurrent spinal cord injury, neurosurgery and neurology were consulted.  A spinal MRI showed no new cord injury. - It was thought that his symptoms were recrudescence of old  symptoms due to  acute infection.   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  Had knee aspiration and injection on  1/06  -knee synovial fluid, no growth to date.  -WBC Synovial fluid notice to be 71,000.  -IV antibiotics order, patient refuse IV access, he understand risk of infection , he would only take oral antibiotics.  -Continue with  Linezolid  and Augmentin .    -Peripheral vascular disease s/p angioplasty October 2025; - Continue aspirin  Lipitor and Plavix   ESRD on hemodialysis: MWF - Nephrology following.   Hyperkalemia, treated with HD Had HD today prior to surgery   Chronic hypotension, asymptomatic -Continue midodrine  Refuse meds at times.  ECHO normal EF. 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  Per cardiology small mobile echodensity on the ventricular aspect of the aortic valve is likely degenerative changes of the valve.  Also blood cultures has been negative.  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   Weight as of this encounter: 84.9 kg.   DVT prophylaxis: Heparin  Code Status: Full code Family Communication: Care discussed with patient Disposition Plan:  Status is: Inpatient Remains inpatient appropriate because: Awaiting skilled nursing facility    Consultants:  Nephrology ENT Neurology  Procedures:  HD  Antimicrobials:    Subjective: Seen in hemodialysis, he is alert and conversant.  He is feeling better.  Blood pressure has been stable.  Objective: Vitals:   02/21/24 0118 02/21/24 0508 02/21/24 0510 02/21/24 0740  BP: 96/61 91/64  107/67  Pulse: 64 75 75 66  Resp:  20  14  Temp:  98.2 F (36.8 C)  98 F (36.7 C)  TempSrc:      SpO2: 100% 91% 100% 99%  Weight:  84.9 kg    Height:        Intake/Output Summary (Last 24 hours) at 02/21/2024 0751 Last data filed at 02/20/2024 2200 Gross per 24 hour  Intake 360 ml  Output --  Net 360 ml   Filed Weights   02/19/24 1927 02/20/24 0500 02/21/24 0508  Weight: 85.7 kg 85.7 kg 84.9 kg    Examination:  General exam: No acute distress Respiratory system: Clear to auscultation Cardiovascular system: S1-S2 regular rhythm and rate Gastrointestinal system:  bowel sounds present, soft nontender nondistended Central nervous system: Alert follows commands Extremities: Symmetric 5 x 5 power.    Data Reviewed: I have personally reviewed following labs and imaging studies  CBC: Recent Labs  Lab 02/18/24 1823 02/21/24 0505  WBC 9.0 7.2  NEUTROABS 7.2  --   HGB 9.9* 10.4*  HCT 30.2* 33.5*  MCV 88.6 93.8  PLT 501* 517*   Basic Metabolic Panel: Recent Labs  Lab 02/17/24 0447 02/18/24 1823  02/19/24 0256 02/20/24 0332 02/21/24 0505  NA 129* 133* 132* 136 133*  K 5.6* 5.2* 5.8* 4.8 5.5*  CL 86* 89* 87* 92* 90*  CO2 23 27 24 29 24   GLUCOSE 80 112* 99 76 84  BUN 80* 49* 60* 36* 59*  CREATININE 11.00* 7.91* 8.54* 5.62* 7.62*  CALCIUM  9.2 9.2 9.3 9.4 8.9  PHOS 5.8* 5.2* 6.5* 4.5 5.7*   GFR: Estimated Creatinine Clearance: 10.9 mL/min (A) (by C-G formula based on SCr of 7.62 mg/dL (H)). Liver Function Tests: Recent Labs  Lab 02/17/24 0447 02/18/24 1823 02/19/24 0256 02/20/24 0332 02/21/24 0505  ALBUMIN  2.9* 3.2* 2.9* 3.1* 2.9*   No results for input(s): LIPASE, AMYLASE in the last 168 hours. No results for input(s): AMMONIA in the last 168 hours. Coagulation Profile: No results for input(s): INR, PROTIME in the last 168 hours. Cardiac Enzymes: No results for input(s): CKTOTAL, CKMB, CKMBINDEX, TROPONINI in the last 168 hours. BNP (last 3 results) No results for input(s): PROBNP in the last 8760 hours. HbA1C: No results for input(s): HGBA1C in the last 72 hours. CBG: Recent Labs  Lab 02/20/24 0832 02/20/24 1229 02/20/24 1653 02/20/24 2002 02/21/24 0654  GLUCAP 72 78 100* 93 83   Lipid  Profile: No results for input(s): CHOL, HDL, LDLCALC, TRIG, CHOLHDL, LDLDIRECT in the last 72 hours. Thyroid  Function Tests: No results for input(s): TSH, T4TOTAL, FREET4, T3FREE, THYROIDAB in the last 72 hours. Anemia Panel: No results for input(s): VITAMINB12, FOLATE, FERRITIN, TIBC, IRON, RETICCTPCT in the last 72 hours. Sepsis Labs: Recent Labs  Lab 02/18/24 1823  LATICACIDVEN 1.2    Recent Results (from the past 240 hours)  Body fluid culture w Gram Stain     Status: None (Preliminary result)   Collection Time: 02/18/24  2:24 PM   Specimen: Synovium; Body Fluid  Result Value Ref Range Status   Specimen Description SYNOVIAL  Final   Special Requests RIGHT KNEE  Final   Gram Stain   Final    ABUNDANT WBC  PRESENT, PREDOMINANTLY PMN NO ORGANISMS SEEN    Culture   Final    NO GROWTH 2 DAYS Performed at [facility], [address] [place], [name]    Report Status PENDING  Incomplete         Radiology Studies: ECHOCARDIOGRAM COMPLETE Result Date: 02/20/2024    ECHOCARDIOGRAM REPORT   Patient Name:   Carl Lafoy Sr. Date of Exam: 02/20/2024 Medical Rec #:  [id number]           Height:       73.0 in Accession #:    [id number]          Weight:       188.9 lb Date of Birth:  November 27, 1960            BSA:          2.100 m Patient Age:    63 years            BP:           86/58 mmHg Patient Gender: M                   HR:           88 bpm. Exam Location:  Inpatient Procedure: 2D Echo, Cardiac Doppler and Color Doppler (Both Spectral and Color            Flow Doppler were utilized during procedure). Indications:    Pre-operative cardiovascular examination  History:        Patient has prior history of Echocardiogram examinations, most                 recent 10/03/2015. Risk Factors:Dyslipidemia, Hypertension and                 Sleep Apnea.  Sonographer:    Sherlean Dubin Referring Phys: [phone] Nafeesah Lapaglia A Corleone Biegler IMPRESSIONS  1. Left ventricular ejection fraction, by estimation, is 65 to 70%. The left ventricle has normal function. The left ventricle has no regional wall motion abnormalities. There is mild left ventricular hypertrophy. Left ventricular diastolic parameters were normal.  2. Right ventricular systolic function is low normal. The right ventricular size is normal. There is normal pulmonary artery systolic pressure.  3. Restricted posterior leaflet mobility of the mitral valve leaflets.  4. The mitral valve is abnormal. Trivial mitral valve regurgitation. Moderate to severe mitral annular calcification.  5. 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 The aortic valve is calcified. There is moderate calcification of the aortic valve. There is moderate thickening of the aortic  valve. Aortic valve regurgitation is mild to moderate. Aortic valve  sclerosis/calcification is present, without any evidence of aortic  stenosis.  6. The inferior vena cava is normal in size with greater than 50% respiratory variability, suggesting right atrial pressure of 3 mmHg. Comparison(s): Prior images unable to be directly viewed, comparison made by report only. Conclusion(s)/Recommendation(s): Small linear echodensity noted on ventricular aspect of aortic valve. 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 FINDINGS  Left Ventricle: Left ventricular ejection fraction, by estimation, is 65 to 70%. The left ventricle has normal function. The left ventricle has no regional wall motion abnormalities. The left ventricular internal cavity size was normal in size. There is  mild left ventricular hypertrophy. Left ventricular diastolic parameters were normal. Right Ventricle: The right ventricular size is normal. Right vetricular wall thickness was not well visualized. Right ventricular systolic function is low normal. There is normal pulmonary artery systolic pressure. The tricuspid regurgitant velocity is 1.86 m/s, and with an assumed right atrial pressure of 3 mmHg, the estimated right ventricular systolic pressure is 16.8 mmHg. Left Atrium: Left atrial size was normal in size. Right Atrium: Right atrial size was normal in size. Pericardium: There is no evidence of pericardial effusion. Mitral Valve: Calcified subvalvular apparatus with appearance of small ruptured chordae tendinae. The mitral valve is abnormal. There is moderate thickening of the mitral valve leaflet(s). There is moderate calcification of the mitral valve leaflet(s). Restricted posterior leaflet mobility of the mitral valve leaflets. Moderate to severe mitral annular calcification. Trivial mitral  valve regurgitation. MV peak gradient, 10.6 mmHg. The mean mitral valve gradient is 5.0 mmHg. Tricuspid Valve: The tricuspid valve is grossly normal. Tricuspid valve regurgitation is trivial. No evidence of tricuspid stenosis. 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 The aortic valve is calcified. There is moderate calcification of the aortic valve. There is moderate thickening of the aortic valve. Aortic valve regurgitation is mild to moderate. Aortic valve sclerosis/calcification is present, without any evidence of aortic stenosis. Aortic valve peak gradient measures 13.0 mmHg. Pulmonic Valve: The pulmonic valve was not well visualized. Pulmonic valve regurgitation is not visualized. No evidence of pulmonic stenosis. Aorta: The aortic root is normal in size and structure. Venous: The inferior vena cava is normal in size with greater than 50% respiratory variability, suggesting right atrial pressure of 3 mmHg. IAS/Shunts: The atrial septum is grossly normal.  LEFT VENTRICLE PLAX 2D LVIDd:         4.00 cm     Diastology LVIDs:         2.80 cm     LV e' medial:    6.20 cm/s LV PW:         0.90 cm     LV E/e' medial:  14.4 LV IVS:        1.40 cm     LV e' lateral:   13.10 cm/s LVOT diam:     2.23 cm     LV E/e' lateral: 6.8 LV SV:         97 LV SV Index:   46 LVOT Area:     3.91 cm  LV Volumes (MOD) LV vol d, MOD A2C: 99.9 ml LV vol d, MOD A4C: 81.2 ml LV vol s, MOD A2C: 25.4 ml LV vol s, MOD A4C: 23.5 ml LV SV MOD A2C:     74.5 ml LV SV MOD A4C:     81.2 ml LV SV MOD BP:  66.5 ml RIGHT VENTRICLE            IVC RV Basal diam:  3.92 cm    IVC diam: 1.38 cm RV Mid diam:    3.59 cm RV S prime:     9.79 cm/s TAPSE (M-mode): 1.3 cm LEFT ATRIUM             Index        RIGHT ATRIUM           Index LA diam:        3.71 cm 1.77 cm/m   RA Area:     18.30 cm LA Vol (A2C):   61.1 ml 29.09 ml/m  RA Volume:   58.30 ml  27.76 ml/m LA Vol (A4C):   38.1 ml 18.14 ml/m LA Biplane Vol: 49.8 ml  23.71 ml/m  AORTIC VALVE AV Area (Vmax): 2.78 cm AV Vmax:        180.00 cm/s AV Peak Grad:   13.0 mmHg LVOT Vmax:      128.00 cm/s LVOT Vmean:     87.800 cm/s LVOT VTI:       0.248 m  AORTA Ao Root diam: 3.02 cm MITRAL VALVE                TRICUSPID VALVE MV Area (PHT): 3.60 cm     TR Peak grad:   13.8 mmHg MV Area VTI:   2.61 cm     TR Vmax:        186.00 cm/s MV Peak grad:  10.6 mmHg MV Mean grad:  5.0 mmHg     SHUNTS MV Vmax:       1.63 m/s     Systemic VTI:  0.25 m MV Vmean:      111.0 cm/s   Systemic Diam: 2.23 cm MV Decel Time: 211 msec MV E velocity: 89.10 cm/s MV A velocity: 128.00 cm/s MV E/A ratio:  0.70 Shelda Bruckner MD Electronically signed by Shelda Bruckner MD Signature Date/Time: 02/20/2024/2:18:13 PM    Final         Scheduled Meds:  (feeding supplement) PROSource Plus  30 mL Oral BID BM   amoxicillin -clavulanate  1 tablet Oral QHS   aspirin  EC  81 mg Oral Daily   atorvastatin   10 mg Oral Daily   bupivacaine (PF)  10 mL Infiltration Once   Chlorhexidine  Gluconate Cloth  6 each Topical Q0600   cinacalcet   120 mg Oral Q breakfast   ciprofloxacin -dexamethasone   4 drop Left EAR BID   clopidogrel   75 mg Oral Daily   darbepoetin (ARANESP ) injection - DIALYSIS  40 mcg Subcutaneous Q Fri-1800   gabapentin   300 mg Oral QHS   heparin   2,500 Units Dialysis Once in dialysis   insulin  aspart  0-6 Units Subcutaneous TID WC   lidocaine   1 patch Transdermal Q24H   lidocaine   1 patch Transdermal Q24H   linezolid   600 mg Oral Q12H   midodrine   10 mg Oral TID WC   multivitamin  1 tablet Oral QHS   pantoprazole   40 mg Oral BID AC   psyllium  1 packet Oral Daily   sevelamer  carbonate  2,400 mg Oral TID WC   sodium chloride  flush  3 mL Intravenous Q12H   sodium zirconium cyclosilicate   5 g Oral Once   Continuous Infusions:   LOS: 10 days    Time spent: 35 Minutes    Carl Aro A Sae Handrich, MD [facility]   If 7PM-7AM, please contact  night-coverage [email or web address]  02/21/2024, 7:51 AM   "

## 2024-02-21 NOTE — Op Note (Addendum)
" ° °  Surgery Date: 02/21/2024  PREOPERATIVE DIAGNOSES:  1. Septic arthritis right knee  POSTOPERATIVE DIAGNOSES:  same  PROCEDURES PERFORMED:  1.  Right knee arthroscopic irrigation and debridement 2.  Right knee arthroscopic major synovectomy in all 3 compartments  SURGEON: N. Ozell Cummins, M.D.  ASSIST: None  ANESTHESIA:  general  FLUIDS: Per anesthesia record.   ESTIMATED BLOOD LOSS: minimal  DESCRIPTION OF PROCEDURE: [name] is a 64 y.o.-year-old male with above mentioned conditions. Full discussion held regarding risks benefits alternatives and complications related surgical intervention. Conservative care options reviewed. All questions answered.  The patient was identified in the preoperative holding area and the operative extremity was marked. The patient was brought to the operating room and transferred to operating table in a supine position. General anesthesia was induced by anesthesiology.    Standard anterolateral arthroscopy portal was obtained. A superolateral portal was obtained with a spinal needle for localization under direct visualization with subsequent diagnostic findings.     Scant purulent synovial fluid was encountered and evacuated.   Anteromedial and anterolateral chambers: severe synovitis Suprapatellar pouch and gutters: severe synovitis or debris. Patella chondral surface: Grade 4 Trochlear chondral surface: Grade 4 Medial femoral condyle weight bearing surface: Grade 4 Medial tibial plateau: Grade 4 Lateral femoral condyle weight bearing surface: Grade 4 Lateral tibial plateau: Grade 4  Major synovectomy was performed with a motorized shaver and all of the compartments of the knee.  15 L of normal saline was irrigated through the knee joint.  Medium Hemovac drain was placed in the joint and brought out the superior lateral portal and this was sutured in.  Excess fluid was removed from the knee joint.  Incisions were closed with interrupted nylon  sutures.  Sterile dressings were applied.  Patient tolerated procedure well had no immediate complications.  DISPOSITION: The patient was awakened from general anesthetic, extubated, taken to the recovery room in medically stable condition, no apparent complications. The patient may be weightbearing as tolerated to the operative lower extremity.  Range of motion of right knee as tolerated.    GEANNIE Ozell Cummins, MD [facility]  1:50 PM  "

## 2024-02-21 NOTE — Progress Notes (Signed)
 " [name] Progress Note   Subjective:    No c/o's, seen in HD  Presentation summary:  64 y.o. male with ESRD on HD, HTN, PAD, h/o CVA, h/o cervical spine fusion in April 2025. He is admitted with new onset right sided weakness. Symptoms started about a week ago. Unable move right leg and has been falling at home. No acute CVA on brain MRI. Cervical spine MRI demonstrates residual mild stenosis C3-4. Neurosurgery consulted. Labs Na 134, K 5.2, BUN 61, WBC 12.1, Hgb 10.1. Febrile on arrival temp 102.1. Blood cultures collected. Receiving antibiotics for suspected otitis media. Presented with ear pain and drainage. Seen and examined in the ED. Progressive right sided weakness. Difficult to lift right arm, leg. He says had similar symptoms when he needed surgery in April. Denies chest pain, sob, nausea/vomiting.  HD MWF. Last dialysis Friday. He usually drives himself but son had to drive d/t weakness. He did complete full treatment. Meeting dry weight. Would be due for dialysis today per his OP clinic holiday schedule.    Objective Vitals:   02/21/24 1445 02/21/24 1500 02/21/24 1510 02/21/24 1515  BP: 100/68 103/66  105/66  Pulse: 95 88 86 88  Resp: 15 13 14 13   Temp:      TempSrc:      SpO2: 99% 94% 98% 97%  Weight:      Height:       Physical Exam General: alert male in NAD Heart: RRR, no murmur Lungs: CTA bilaterally Abdomen: soft, non-distended Extremities: no edema b/l lower extremities Dialysis Access:  AVF+ bruit  Dialysis Orders: MWF NW 4h  B400  87.3kg  2K bath  AVF   Heparin  3000 ESA: Mircera 50 q 2 wks (last 12/15) BMM: Calcitriol  2 mcg PO q HD, cinacalcet  120 mg daily   Assessment/Plan: ESRD: on HD MWF. HD today.  Volume: looks euvolemic on exam, BP's soft and below dry wt. Keep even next HD.  Septic arthritis R knee: going for I&D today per orthopedics Hypotension: BP chronically low end - continue midodrine  10mg  with HD. Declines blood  products/albumin . Anemia of ESRD: ESA darbe 40mcg sq weekly  R sided weakness. Hx cervical spine injury/fusion. No acute CVA on MRI, does have significant DDD but no cord compression and no surgical recs per neurosurgery note. Turned down by CIR. Mastoiditis/ sepsis: w/ ear pain. Blood Cx negative. On IV Zosyn . Sepsis on admission felt due to suppurative otitis media.  Metabolic bone disease. CorrCa high, continue sevelamer  binder + sensipar , calcitriol  on hold. Nutrition: Alb low, continue supps and renal MVI.  Dispo: not clear at this time   Myer Fret  MD  [facility] 02/21/2024, 3:46 PM  Recent Labs  Lab 02/20/24 0332 02/21/24 0505 02/21/24 1237  HGB  --  10.4* 9.5*  ALBUMIN  3.1* 2.9*  --   CALCIUM  9.4 8.9  --   PHOS 4.5 5.7*  --   CREATININE 5.62* 7.62* 4.50*  K 4.8 5.5* 4.1    Inpatient medications:  [MAR Hold] (feeding supplement) PROSource Plus  30 mL Oral BID BM   [MAR Hold] amoxicillin -clavulanate  1 tablet Oral QHS   [MAR Hold] aspirin  EC  81 mg Oral Daily   [MAR Hold] atorvastatin   10 mg Oral Daily   [MAR Hold] bupivacaine (PF)  10 mL Infiltration Once   chlorhexidine   60 mL Topical Once   [MAR Hold] Chlorhexidine  Gluconate Cloth  6 each Topical Q0600   [MAR Hold] cinacalcet   120 mg Oral Q  breakfast   [MAR Hold] ciprofloxacin -dexamethasone   4 drop Left EAR BID   [MAR Hold] clopidogrel   75 mg Oral Daily   [MAR Hold] darbepoetin (ARANESP ) injection - DIALYSIS  40 mcg Subcutaneous Q Fri-1800   [MAR Hold] gabapentin   300 mg Oral QHS   [MAR Hold] insulin  aspart  0-6 Units Subcutaneous TID WC   [MAR Hold] lidocaine   1 patch Transdermal Q24H   [MAR Hold] lidocaine   1 patch Transdermal Q24H   [MAR Hold] linezolid   600 mg Oral Q12H   [MAR Hold] midodrine   10 mg Oral TID WC   [MAR Hold] multivitamin  1 tablet Oral QHS   [MAR Hold] pantoprazole   40 mg Oral BID AC   [MAR Hold] psyllium  1 packet Oral Daily   [MAR Hold] sevelamer  carbonate  2,400 mg Oral TID WC   [MAR Hold] sodium  chloride flush  3 mL Intravenous Q12H   [MAR Hold] sodium zirconium cyclosilicate   5 g Oral Once    sodium chloride      sodium chloride      [START ON 02/22/2024]  ceFAZolin  (ANCEF ) IV      [MAR Hold] acetaminophen  **OR** [MAR Hold] acetaminophen , [MAR Hold] cyclobenzaprine , [MAR Hold] diphenhydrAMINE , HYDROmorphone  (DILAUDID ) injection, [MAR Hold] melatonin, midazolam  PF, [MAR Hold] midodrine , oxyCODONE  **OR** oxyCODONE , [MAR Hold] oxyCODONE -acetaminophen        "

## 2024-02-21 NOTE — Anesthesia Postprocedure Evaluation (Signed)
"   Anesthesia Post Note  Patient: Carl Holtsclaw Sr.  Procedure(s) Performed: ARTHROSCOPY, KNEE (Right: Knee)     Patient location during evaluation: PACU Anesthesia Type: General Level of consciousness: awake and alert Pain management: pain level controlled Vital Signs Assessment: post-procedure vital signs reviewed and stable Respiratory status: spontaneous breathing, nonlabored ventilation, respiratory function stable and patient connected to nasal cannula oxygen Cardiovascular status: blood pressure returned to baseline and stable Postop Assessment: no apparent nausea or vomiting Anesthetic complications: no   No notable events documented.  Last Vitals:  Vitals:   02/21/24 1500 02/21/24 1510  BP: 103/66   Pulse: 88 86  Resp: 13 14  Temp:    SpO2: 94% 98%    Last Pain:  Vitals:   02/21/24 1510  TempSrc:   PainSc: 8                  [name]      "

## 2024-02-21 NOTE — Transfer of Care (Signed)
 Immediate Anesthesia Transfer of Care Note  Patient: Carl Creekmore Sr.  Procedure(s) Performed: ARTHROSCOPY, KNEE (Right: Knee)  Patient Location: PACU  Anesthesia Type:General  Level of Consciousness: awake and alert   Airway & Oxygen Therapy: Patient Spontanous Breathing  Post-op Assessment: Report given to RN and Post -op Vital signs reviewed and stable  Post vital signs: Reviewed and stable  Last Vitals:  Vitals Value Taken Time  BP 103/66 02/21/24 13:55  Temp    Pulse 98 02/21/24 13:57  Resp 10 02/21/24 13:57  SpO2 95 % 02/21/24 13:57  Vitals shown include unfiled device data.  Last Pain:  Vitals:   02/21/24 1220  TempSrc:   PainSc: 7          Complications: No notable events documented.

## 2024-02-21 NOTE — Plan of Care (Signed)
   Problem: Education: Goal: Knowledge of General Education information will improve Description: Including pain rating scale, medication(s)/side effects and non-pharmacologic comfort measures Outcome: Progressing   Problem: Health Behavior/Discharge Planning: Goal: Ability to manage health-related needs will improve Outcome: Progressing   Problem: Clinical Measurements: Goal: Ability to maintain clinical measurements within normal limits will improve Outcome: Progressing Goal: Will remain free from infection Outcome: Progressing Goal: Diagnostic test results will improve Outcome: Progressing Goal: Respiratory complications will improve Outcome: Progressing Goal: Cardiovascular complication will be avoided Outcome: Progressing   Problem: Nutrition: Goal: Adequate nutrition will be maintained Outcome: Progressing

## 2024-02-21 NOTE — Anesthesia Preprocedure Evaluation (Addendum)
"                                    Anesthesia Evaluation  Patient identified by MRN, date of birth, ID band Patient awake    Reviewed: Allergy & Precautions, NPO status , Patient's Chart, lab work & pertinent test results  History of Anesthesia Complications Negative for: history of anesthetic complications  Airway Mallampati: II  TM Distance: >3 FB Neck ROM: Full    Dental no notable dental hx. (+) Upper Dentures   Pulmonary sleep apnea , former smoker   Pulmonary exam normal        Cardiovascular hypertension, + Peripheral Vascular Disease   Rhythm:Regular Rate:Normal     Neuro/Psych  Headaches, Seizures -, Well Controlled,  PSYCHIATRIC DISORDERS Anxiety Depression    CVA, No Residual Symptoms    GI/Hepatic Neg liver ROS, PUD,GERD  Medicated,,  Endo/Other  diabetes, Type 2    Renal/GU ESRF and DialysisRenal disease  negative genitourinary   Musculoskeletal  (+) Arthritis , Osteoarthritis,    Abdominal Normal abdominal exam  (+)   Peds  Hematology  (+) Blood dyscrasia, anemia , REFUSES BLOOD PRODUCTS, JEHOVAH'S WITNESS  Anesthesia Other Findings   Reproductive/Obstetrics                              Anesthesia Physical Anesthesia Plan  ASA: 3  Anesthesia Plan: General   Post-op Pain Management:    Induction: Intravenous  PONV Risk Score and Plan: 2 and Ondansetron , Dexamethasone  and Treatment may vary due to age or medical condition  Airway Management Planned: Oral ETT  Additional Equipment: None  Intra-op Plan:   Post-operative Plan: Extubation in OR  Informed Consent: I have reviewed the patients History and Physical, chart, labs and discussed the procedure including the risks, benefits and alternatives for the proposed anesthesia with the patient or authorized representative who has indicated his/her understanding and acceptance.     Dental advisory given  Plan Discussed with:   Anesthesia Plan  Comments:         Anesthesia Quick Evaluation  "

## 2024-02-21 NOTE — Anesthesia Procedure Notes (Signed)
 Procedure Name: LMA Insertion Date/Time: 02/21/2024 1:03 PM  Performed by: Roslynn Waddell LABOR, CRNAPre-anesthesia Checklist: Patient identified, Emergency Drugs available, Suction available and Patient being monitored Patient Re-evaluated:Patient Re-evaluated prior to induction Oxygen Delivery Method: Circle System Utilized Preoxygenation: Pre-oxygenation with 100% oxygen Induction Type: IV induction Ventilation: Mask ventilation without difficulty LMA: LMA inserted LMA Size: 5.0 Number of attempts: 1 Airway Equipment and Method: Bite block Placement Confirmation: positive ETCO2 Tube secured with: Tape Dental Injury: Teeth and Oropharynx as per pre-operative assessment  Comments: Atraumatic induction/intubation. Dentition and oral mucosa as per preop (baseline edentulous uppers)

## 2024-02-22 ENCOUNTER — Encounter (HOSPITAL_COMMUNITY): Payer: Self-pay | Admitting: Orthopaedic Surgery

## 2024-02-22 DIAGNOSIS — H66012 Acute suppurative otitis media with spontaneous rupture of ear drum, left ear: Secondary | ICD-10-CM | POA: Diagnosis not present

## 2024-02-22 LAB — GLUCOSE, CAPILLARY
Glucose-Capillary: 102 mg/dL — ABNORMAL HIGH (ref 70–99)
Glucose-Capillary: 110 mg/dL — ABNORMAL HIGH (ref 70–99)
Glucose-Capillary: 115 mg/dL — ABNORMAL HIGH (ref 70–99)
Glucose-Capillary: 120 mg/dL — ABNORMAL HIGH (ref 70–99)

## 2024-02-22 LAB — RENAL FUNCTION PANEL
Albumin: 2.8 g/dL — ABNORMAL LOW (ref 3.5–5.0)
Anion gap: 14 (ref 5–15)
BUN: 42 mg/dL — ABNORMAL HIGH (ref 8–23)
CO2: 26 mmol/L (ref 22–32)
Calcium: 8.9 mg/dL (ref 8.9–10.3)
Chloride: 92 mmol/L — ABNORMAL LOW (ref 98–111)
Creatinine, Ser: 5.37 mg/dL — ABNORMAL HIGH (ref 0.61–1.24)
GFR, Estimated: 11 mL/min — ABNORMAL LOW
Glucose, Bld: 105 mg/dL — ABNORMAL HIGH (ref 70–99)
Phosphorus: 5.4 mg/dL — ABNORMAL HIGH (ref 2.5–4.6)
Potassium: 5.7 mmol/L — ABNORMAL HIGH (ref 3.5–5.1)
Sodium: 133 mmol/L — ABNORMAL LOW (ref 135–145)

## 2024-02-22 LAB — BODY FLUID CULTURE W GRAM STAIN: Culture: NO GROWTH

## 2024-02-22 MED ORDER — SODIUM ZIRCONIUM CYCLOSILICATE 10 G PO PACK
10.0000 g | PACK | Freq: Once | ORAL | Status: AC
  Administered 2024-02-22: 10 g via ORAL
  Filled 2024-02-22: qty 1

## 2024-02-22 MED ORDER — SODIUM ZIRCONIUM CYCLOSILICATE 10 G PO PACK
10.0000 g | PACK | Freq: Three times a day (TID) | ORAL | Status: DC
  Administered 2024-02-22 – 2024-02-23 (×3): 10 g via ORAL
  Filled 2024-02-22 (×3): qty 1

## 2024-02-22 MED ORDER — AMOXICILLIN-POT CLAVULANATE 500-125 MG PO TABS
1.0000 | ORAL_TABLET | Freq: Two times a day (BID) | ORAL | Status: DC
  Administered 2024-02-22 – 2024-02-23 (×4): 1 via ORAL
  Filled 2024-02-22 (×5): qty 1

## 2024-02-22 NOTE — Progress Notes (Signed)
 PHARMACY NOTE:  ANTIMICROBIAL RENAL DOSAGE ADJUSTMENT  Current antimicrobial regimen includes a mismatch between antimicrobial dosage and estimated renal function.  As per policy approved by the Pharmacy & Therapeutics and Medical Executive Committees, the antimicrobial dosage will be adjusted accordingly.  Current antimicrobial dosage:  augmentin  500-125mg  PO daily  Indication: septic arthritis  Renal Function:  Estimated Creatinine Clearance: 15.5 mL/min (A) (by C-G formula based on SCr of 5.37 mg/dL (H)). []      On intermittent HD, scheduled: []      On CRRT    Antimicrobial dosage has been changed to:  augmentin  500-125 mg PO BID  Additional comments:   Thank you for allowing pharmacy to be a part of this patient's care.  Elma Fail, PharmD PGY1 Clinical Pharmacist [facility]  02/22/2024 9:37 AM

## 2024-02-22 NOTE — Progress Notes (Signed)
 " PROGRESS NOTE    Carl Schulenburg Sr.  FMW:[id number] DOB: 26-Jan-1961 DOA: 02/08/2024 PCP: [name], MD   Brief Narrative: 64 year old with past medical history significant for ESRD on hemodialysis MWF, PVD s/p angioplasty right S FA October 2025 for ischemic rest pain as well as C4 spinal fracture April 2025 with incomplete spinal cord injury requiring C4 corpectomy, fusion and interbody cage by Dr. [name], wheelchair-bound for a time but progressed by Agurs to walking with a cane and driving, presented with right arm and leg weakness, fall as well as posterior neck pain, fever, ear drainage.  Admitted started on antibiotics, neurosurgery, neurology orthopedic ENT and nephrology consulted.  Mildly diagnosed with otitis media and ruptured eardrum.  Right-sided weakness believed to be recrudescence of old spinal cord injury symptoms in the setting of infection.  Denied  to CIR placement by insurance.  Knee synovial fluid consistent with septic arthritis, underwent irrigation and debridement by Dr. [name] on 1/9.   Assessment & Plan:   Principal Problem:   Sepsis (HCC) Active Problems:   GERD (gastroesophageal reflux disease)   HLD (hyperlipidemia)   Essential hypertension   Diarrhea, unspecified   ESRD (end stage renal disease) (HCC)   Obstructive sleep apnea   Type 2 diabetes mellitus with diabetic peripheral angiopathy without gangrene (HCC)   Acute suppurative otitis media of left ear with spontaneous rupture of tympanic membrane   Mastoid disorder, bilateral   Conductive hearing loss   Acute right-sided weakness   Septic arthritis of knee, right (HCC)   1-Sepsis due to otitis media with suspected TM perforation and Right Knee septic arthritis.  - Patient met sepsis criteria on admission suspected due to suppurative otitis media.  Blood cultures no growth to date.  COVID flu RSV negative - ENT [name] was consulted and patient underwent CT of the temporal bones without  evidence of coalescent mastoiditis, bone breakdown or abscess -He completed 10 days of antibiotics for otitis.  - Patient had low-grade fever 1/6. No further fever since yesterday. If spike fever will need repeat blood cultures, workup -needs follow up with ENT on January 5 -- 12th. - Continue with  Ciprodex  drops, ENT recommend it for 14 days.  Resume antibiotics to cover knee infection.   Right-sided weakness History of cervical decompression corpectomy C4 by [name] April 2025 Remote left occipital infarct - There was some concern for recurrent spinal cord injury, neurosurgery and neurology were consulted.  A spinal MRI showed no new cord injury. - It was thought that his symptoms were recrudescence of old  symptoms due to  acute infection.   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  Had knee aspiration and injection on  1/06  -knee synovial fluid, no growth to date.  -WBC Synovial fluid notice to be 71,000.  -IV antibiotics order, patient refuse IV access, he understand risk of infection , he would only take oral antibiotics.  -Continue with  Linezolid  and Augmentin .  BP improved.   -Peripheral vascular disease s/p angioplasty October 2025; - Continue aspirin  Lipitor and Plavix   ESRD on hemodialysis: MWF - Nephrology following.   -had HD 1/09. K elevated, will give a dose of lokelma  today   Chronic hypotension, asymptomatic -Continue midodrine  Refuse meds at times.  ECHO normal EF. 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  Per cardiology  small mobile echodensity on the ventricular aspect of the aortic valve is likely degenerative changes of the valve.  Also blood cultures has been negative.  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   Weight as of this encounter: 86.8 kg.   DVT prophylaxis: Heparin  Code Status: Full code Family Communication: Care discussed with patient Disposition Plan:  Status is: Inpatient Remains inpatient appropriate because: Awaiting skilled nursing facility    Consultants:  Nephrology ENT Neurology  Procedures:  HD  Antimicrobials:    Subjective: He is alert, report knee pain stable.  Agreed to take midodrine  today.   Objective: Vitals:   02/21/24 2113 02/22/24 0032 02/22/24 0500 02/22/24 0512  BP: 103/83 96/71  103/67  Pulse: 84 86  70  Resp: 20 20  20   Temp: 98.2 F (36.8 C) (!) 97.3 F (36.3 C)  98.1 F (36.7 C)  TempSrc:      SpO2: 98% 100%  98%  Weight:   86.8 kg   Height:        Intake/Output Summary (Last 24 hours) at 02/22/2024 0729 Last data filed at 02/22/2024 0228 Gross per 24 hour  Intake 353 ml  Output 115 ml  Net 238 ml   Filed Weights   02/21/24 0750 02/21/24 1158 02/22/24 0500  Weight: 86.8 kg 86.8 kg 86.8 kg    Examination:  General exam; NAD Respiratory system: CTA Cardiovascular system: S 1 S 2 RRR Gastrointestinal system: BS present, soft, nt Central nervous system: Alert Extremities: Right knee with dressing     Data Reviewed: I have personally reviewed following labs and imaging studies  CBC: Recent Labs  Lab 02/18/24 1823 02/21/24 0505 02/21/24 1237  WBC 9.0 7.2  --   NEUTROABS 7.2  --   --   HGB 9.9* 10.4* 9.5*  HCT 30.2* 33.5* 28.0*  MCV 88.6 93.8  --   PLT 501* 517*  --    Basic Metabolic Panel: Recent Labs  Lab  02/18/24 1823 02/19/24 0256 02/20/24 0332 02/21/24 0505 02/21/24 1237 02/22/24 0534  NA 133* 132* 136 133* 134* 133*  K 5.2* 5.8* 4.8 5.5* 4.1 5.7*  CL 89* 87* 92* 90* 93* 92*  CO2 27 24 29 24   --  26  GLUCOSE 112* 99 76 84 76 105*  BUN 49* 60* 36* 59* 32* 42*  CREATININE 7.91* 8.54* 5.62* 7.62* 4.50* 5.37*  CALCIUM  9.2 9.3 9.4 8.9  --  8.9  PHOS 5.2* 6.5* 4.5 5.7*  --  5.4*   GFR: Estimated Creatinine Clearance: 15.5 mL/min (A) (by C-G formula based on SCr of 5.37 mg/dL (H)). Liver Function Tests: Recent Labs  Lab 02/18/24 1823 02/19/24 0256 02/20/24 0332 02/21/24 0505 02/22/24 0534  ALBUMIN  3.2* 2.9* 3.1* 2.9* 2.8*   No results for input(s): LIPASE, AMYLASE in the last 168 hours. No results for input(s): AMMONIA in the last 168 hours. Coagulation Profile: No results for input(s): INR, PROTIME in the last 168 hours. Cardiac Enzymes: No results for input(s): CKTOTAL, CKMB, CKMBINDEX, TROPONINI in the last 168 hours. BNP (last 3 results) No results for  input(s): PROBNP in the last 8760 hours. HbA1C: No results for input(s): HGBA1C in the last 72 hours. CBG: Recent Labs  Lab 02/21/24 1208 02/21/24 1507 02/21/24 1646 02/21/24 2118 02/22/24 0651  GLUCAP 79 76 101* 143* 102*   Lipid Profile: No results for input(s): CHOL, HDL, LDLCALC, TRIG, CHOLHDL, LDLDIRECT in the last 72 hours. Thyroid  Function Tests: No results for input(s): TSH, T4TOTAL, FREET4, T3FREE, THYROIDAB in the last 72 hours. Anemia Panel: No results for input(s): VITAMINB12, FOLATE, FERRITIN, TIBC, IRON, RETICCTPCT in the last 72 hours. Sepsis Labs: Recent Labs  Lab 02/18/24 1823  LATICACIDVEN 1.2    Recent Results (from the past 240 hours)  Body fluid culture w Gram Stain     Status: None (Preliminary result)   Collection Time: 02/18/24  2:24 PM   Specimen: Synovium; Body Fluid  Result Value Ref Range Status   Specimen Description  SYNOVIAL  Final   Special Requests RIGHT KNEE  Final   Gram Stain   Final    ABUNDANT WBC PRESENT, PREDOMINANTLY PMN NO ORGANISMS SEEN    Culture   Final    NO GROWTH 3 DAYS Performed at [facility], [address] [place], [name]    Report Status PENDING  Incomplete         Radiology Studies: ECHOCARDIOGRAM COMPLETE Result Date: 02/20/2024    ECHOCARDIOGRAM REPORT   Patient Name:   Carl Delair Sr. Date of Exam: 02/20/2024 Medical Rec #:  [id number]           Height:       73.0 in Accession #:    [id number]          Weight:       188.9 lb Date of Birth:  02/19/1960            BSA:          2.100 m Patient Age:    63 years            BP:           86/58 mmHg Patient Gender: M                   HR:           88 bpm. Exam Location:  Inpatient Procedure: 2D Echo, Cardiac Doppler and Color Doppler (Both Spectral and Color            Flow Doppler were utilized during procedure). Indications:    Pre-operative cardiovascular examination  History:        Patient has prior history of Echocardiogram examinations, most                 recent 10/03/2015. Risk Factors:Dyslipidemia, Hypertension and                 Sleep Apnea.  Sonographer:    Sherlean Dubin Referring Phys: [phone] Zohan Shiflet A Mikayela Deats IMPRESSIONS  1. Left ventricular ejection fraction, by estimation, is 65 to 70%. The left ventricle has normal function. The left ventricle has no regional wall motion abnormalities. There is mild left ventricular hypertrophy. Left ventricular diastolic parameters were normal.  2. Right ventricular systolic function is low normal. The right ventricular size is normal. There is normal pulmonary artery systolic pressure.  3. Restricted posterior leaflet mobility of the mitral valve leaflets.  4. The mitral valve is abnormal. Trivial mitral valve regurgitation. Moderate to severe mitral annular calcification.  5. Linear mass seen on ventricular aspect  of aortic valve. The aortic valve is calcified. There  is moderate calcification of the aortic valve. There is moderate thickening of the aortic valve. Aortic valve regurgitation is mild to moderate. Aortic valve  sclerosis/calcification is present, without any evidence of aortic stenosis.  6. The inferior vena cava is normal in size with greater than 50% respiratory variability, suggesting right atrial pressure of 3 mmHg. Comparison(s): Prior images unable to be directly viewed, comparison made by report only. Conclusion(s)/Recommendation(s): Small linear echodensity noted on ventricular aspect of aortic valve. 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 FINDINGS  Left Ventricle: Left ventricular ejection fraction, by estimation, is 65 to 70%. The left ventricle has normal function. The left ventricle has no regional wall motion abnormalities. The left ventricular internal cavity size was normal in size. There is  mild left ventricular hypertrophy. Left ventricular diastolic parameters were normal. Right Ventricle: The right ventricular size is normal. Right vetricular wall thickness was not well visualized. Right ventricular systolic function is low normal. There is normal pulmonary artery systolic pressure. The tricuspid regurgitant velocity is 1.86 m/s, and with an assumed right atrial pressure of 3 mmHg, the estimated right ventricular systolic pressure is 16.8 mmHg. Left Atrium: Left atrial size was normal in size. Right Atrium: Right atrial size was normal in size. Pericardium: There is no evidence of pericardial effusion. Mitral Valve: Calcified subvalvular apparatus with appearance of small ruptured chordae tendinae. The mitral valve is abnormal. There is moderate thickening of the mitral valve leaflet(s). There is moderate calcification of the mitral valve leaflet(s). Restricted posterior leaflet mobility of the  mitral valve leaflets. Moderate to severe mitral annular calcification. Trivial mitral valve regurgitation. MV peak gradient, 10.6 mmHg. The mean mitral valve gradient is 5.0 mmHg. Tricuspid Valve: The tricuspid valve is grossly normal. Tricuspid valve regurgitation is trivial. No evidence of tricuspid stenosis. 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 The aortic valve is calcified. There is moderate calcification of the aortic valve. There is moderate thickening of the aortic valve. Aortic valve regurgitation is mild to moderate. Aortic valve sclerosis/calcification is present, without any evidence of aortic stenosis. Aortic valve peak gradient measures 13.0 mmHg. Pulmonic Valve: The pulmonic valve was not well visualized. Pulmonic valve regurgitation is not visualized. No evidence of pulmonic stenosis. Aorta: The aortic root is normal in size and structure. Venous: The inferior vena cava is normal in size with greater than 50% respiratory variability, suggesting right atrial pressure of 3 mmHg. IAS/Shunts: The atrial septum is grossly normal.  LEFT VENTRICLE PLAX 2D LVIDd:         4.00 cm     Diastology LVIDs:         2.80 cm     LV e' medial:    6.20 cm/s LV PW:         0.90 cm     LV E/e' medial:  14.4 LV IVS:        1.40 cm     LV e' lateral:   13.10 cm/s LVOT diam:     2.23 cm     LV E/e' lateral: 6.8 LV SV:         97 LV SV Index:   46 LVOT Area:     3.91 cm  LV Volumes (MOD) LV vol d, MOD A2C: 99.9 ml LV vol d, MOD A4C: 81.2 ml  LV vol s, MOD A2C: 25.4 ml LV vol s, MOD A4C: 23.5 ml LV SV MOD A2C:     74.5 ml LV SV MOD A4C:     81.2 ml LV SV MOD BP:      66.5 ml RIGHT VENTRICLE            IVC RV Basal diam:  3.92 cm    IVC diam: 1.38 cm RV Mid diam:    3.59 cm RV S prime:     9.79 cm/s TAPSE (M-mode): 1.3 cm LEFT ATRIUM             Index        RIGHT ATRIUM           Index LA diam:        3.71 cm 1.77 cm/m   RA Area:     18.30 cm LA Vol (A2C):   61.1 ml 29.09 ml/m  RA Volume:    58.30 ml  27.76 ml/m LA Vol (A4C):   38.1 ml 18.14 ml/m LA Biplane Vol: 49.8 ml 23.71 ml/m  AORTIC VALVE AV Area (Vmax): 2.78 cm AV Vmax:        180.00 cm/s AV Peak Grad:   13.0 mmHg LVOT Vmax:      128.00 cm/s LVOT Vmean:     87.800 cm/s LVOT VTI:       0.248 m  AORTA Ao Root diam: 3.02 cm MITRAL VALVE                TRICUSPID VALVE MV Area (PHT): 3.60 cm     TR Peak grad:   13.8 mmHg MV Area VTI:   2.61 cm     TR Vmax:        186.00 cm/s MV Peak grad:  10.6 mmHg MV Mean grad:  5.0 mmHg     SHUNTS MV Vmax:       1.63 m/s     Systemic VTI:  0.25 m MV Vmean:      111.0 cm/s   Systemic Diam: 2.23 cm MV Decel Time: 211 msec MV E velocity: 89.10 cm/s MV A velocity: 128.00 cm/s MV E/A ratio:  0.70 Shelda Bruckner MD Electronically signed by Shelda Bruckner MD Signature Date/Time: 02/20/2024/2:18:13 PM    Final         Scheduled Meds:  (feeding supplement) PROSource Plus  30 mL Oral BID BM   amoxicillin -clavulanate  1 tablet Oral QHS   aspirin  EC  81 mg Oral Daily   atorvastatin   10 mg Oral Daily   bupivacaine (PF)  10 mL Infiltration Once   Chlorhexidine  Gluconate Cloth  6 each Topical Q0600   cinacalcet   120 mg Oral Q breakfast   ciprofloxacin -dexamethasone   4 drop Left EAR BID   clopidogrel   75 mg Oral Daily   darbepoetin (ARANESP ) injection - DIALYSIS  40 mcg Subcutaneous Q Fri-1800   gabapentin   300 mg Oral QHS   insulin  aspart  0-6 Units Subcutaneous TID WC   lidocaine   1 patch Transdermal Q24H   lidocaine   1 patch Transdermal Q24H   linezolid   600 mg Oral Q12H   midodrine   10 mg Oral TID WC   multivitamin  1 tablet Oral QHS   pantoprazole   40 mg Oral BID AC   psyllium  1 packet Oral Daily   sevelamer  carbonate  2,400 mg Oral TID WC   sodium chloride  flush  3 mL Intravenous Q12H   sodium zirconium cyclosilicate   10 g Oral  Once   Continuous Infusions:   LOS: 11 days    Time spent: 35 Minutes    Priseis Cratty A Reika Callanan, MD [facility]   If 7PM-7AM, please  contact night-coverage [email or web address]  02/22/2024, 7:29 AM   "

## 2024-02-22 NOTE — Plan of Care (Signed)
" °  Problem: Education: Goal: Knowledge of General Education information will improve Description: Including pain rating scale, medication(s)/side effects and non-pharmacologic comfort measures Outcome: Progressing   Problem: Health Behavior/Discharge Planning: Goal: Ability to manage health-related needs will improve Outcome: Progressing   Problem: Clinical Measurements: Goal: Ability to maintain clinical measurements within normal limits will improve Outcome: Progressing Goal: Will remain free from infection Outcome: Progressing Goal: Diagnostic test results will improve Outcome: Progressing   Problem: Activity: Goal: Risk for activity intolerance will decrease Outcome: Progressing   Problem: Nutrition: Goal: Adequate nutrition will be maintained Outcome: Progressing   Problem: Coping: Goal: Level of anxiety will decrease Outcome: Progressing   Problem: Elimination: Goal: Will not experience complications related to urinary retention Outcome: Progressing   Problem: Pain Managment: Goal: General experience of comfort will improve and/or be controlled Outcome: Progressing   Problem: Safety: Goal: Ability to remain free from injury will improve Outcome: Progressing   Problem: Skin Integrity: Goal: Risk for impaired skin integrity will decrease Outcome: Progressing   Problem: Education: Goal: Ability to describe self-care measures that may prevent or decrease complications (Diabetes Survival Skills Education) will improve Outcome: Progressing Goal: Individualized Educational Video(s) Outcome: Progressing   Problem: Coping: Goal: Ability to adjust to condition or change in health will improve Outcome: Progressing   Problem: Fluid Volume: Goal: Ability to maintain a balanced intake and output will improve Outcome: Progressing   Problem: Health Behavior/Discharge Planning: Goal: Ability to identify and utilize available resources and services will  improve Outcome: Progressing Goal: Ability to manage health-related needs will improve Outcome: Progressing   Problem: Metabolic: Goal: Ability to maintain appropriate glucose levels will improve Outcome: Progressing   Problem: Nutritional: Goal: Maintenance of adequate nutrition will improve Outcome: Progressing Goal: Progress toward achieving an optimal weight will improve Outcome: Progressing   Problem: Skin Integrity: Goal: Risk for impaired skin integrity will decrease Outcome: Progressing   Problem: Tissue Perfusion: Goal: Adequacy of tissue perfusion will improve Outcome: Progressing   Problem: Education: Goal: Knowledge of the prescribed therapeutic regimen will improve Outcome: Progressing   Problem: Bowel/Gastric: Goal: Gastrointestinal status for postoperative course will improve Outcome: Progressing   Problem: Cardiac: Goal: Ability to maintain an adequate cardiac output Outcome: Progressing Goal: Will show no evidence of cardiac arrhythmias Outcome: Progressing   Problem: Nutritional: Goal: Will attain and maintain optimal nutritional status Outcome: Progressing   Problem: Neurological: Goal: Will regain or maintain usual level of consciousness Outcome: Progressing   Problem: Clinical Measurements: Goal: Ability to maintain clinical measurements within normal limits Outcome: Progressing Goal: Postoperative complications will be avoided or minimized Outcome: Progressing   Problem: Respiratory: Goal: Will regain and/or maintain adequate ventilation Outcome: Progressing Goal: Respiratory status will improve Outcome: Progressing   Problem: Skin Integrity: Goal: Demonstrates signs of wound healing without infection Outcome: Progressing   Problem: Urinary Elimination: Goal: Will remain free from infection Outcome: Progressing Goal: Ability to achieve and maintain adequate urine output Outcome: Progressing   "

## 2024-02-22 NOTE — Progress Notes (Signed)
" ° °  Subjective:  Patient reports pain as moderate.  no acute events.  Had some increased pain earlier today. Son is at bedside.  Today's  total administered Morphine  Milligram Equivalents: 22.5  Objective:   VITALS:   Vitals:   02/22/24 0500 02/22/24 0512 02/22/24 0748 02/22/24 1120  BP:  103/67 111/65 108/64  Pulse:  70 82 72  Resp:  20 20 18   Temp:  98.1 F (36.7 C) 97.9 F (36.6 C) 98.2 F (36.8 C)  TempSrc:      SpO2:  98% (!) 89% 96%  Weight: 86.8 kg     Height:        Intact pulses distally Dorsiflexion/Plantar flexion intact No cellulitis present Compartment soft Hemovac in place with good suction. About 50 cc of drainage.   Lab Results  Component Value Date   WBC 7.2 02/21/2024   HGB 9.5 (L) 02/21/2024   HCT 28.0 (L) 02/21/2024   MCV 93.8 02/21/2024   PLT 517 (H) 02/21/2024     Assessment/Plan:  1 Day Post-Op   - Expected postop acute blood loss anemia - Up with PT/OT, WBAT RLE - may resume DVT prophylaxis of choice at this time - continue HVAC, will plan to remove Monday  [name] 02/22/2024, 2:57 PM  "

## 2024-02-22 NOTE — Progress Notes (Signed)
 " [facility] Progress Note   Subjective:    No c/o's Seen in room Had R knee I&D yesterday  Presentation summary: 64 year old with past medical history significant for esrd on HD mwf, PVD s/p angioplasty right SFA October 2025 for ischemic rest pain, also C4 spinal fracture April 2025 with incomplete spinal cord injury requiring C4 corpectomy, fusion and interbody cage by Dr. [name], wheelchair-bound for a time but progressed by August to walking with a cane and driving. Pt was admitted with new onset right sided weakness. Symptoms started about a week ago. Unable move right leg and has been falling at home. No acute CVA on brain MRI. Cervical spine MRI demonstrates residual mild stenosis C3-4. Neurosurgery consulted. Labs Na 134, K 5.2, BUN 61, WBC 12.1, Hgb 10.1. Febrile on arrival temp 102.1. Blood cultures collected. Receiving antibiotics for suspected otitis media. Presented with ear pain and drainage. Seen and examined in the ED. Progressive right sided weakness. Difficult to lift right arm, leg. He says had similar symptoms when he needed surgery in April. Denies chest pain, sob, nausea/vomiting.  HD MWF. Last dialysis Friday. He usually drives himself but son had to drive d/t weakness. He did complete full treatment. Meeting dry weight. Would be due for dialysis today per his OP clinic holiday schedule.    Objective Vitals:   02/22/24 0500 02/22/24 0512 02/22/24 0748 02/22/24 1120  BP:  103/67 111/65 108/64  Pulse:  70 82 72  Resp:  20 20 18   Temp:  98.1 F (36.7 C) 97.9 F (36.6 C) 98.2 F (36.8 C)  TempSrc:      SpO2:  98% (!) 89% 96%  Weight: 86.8 kg     Height:       Physical Exam General: alert male in NAD Heart: RRR, no murmur Lungs: CTA bilaterally Abdomen: soft, non-distended Extremities: no edema b/l lower extremities R knee drain in place Dialysis Access:  AVF+ bruit  Home bp meds: Midodrine  10mg  pre hd mwf   Dialysis Orders: MWF NW 4h  B400   87.3kg  2K bath  AVF   Heparin  3000 ESA: Mircera 50 q 2 wks (last 12/15) BMM: Calcitriol  2 mcg PO q HD, cinacalcet  120 mg daily   Assessment/Plan: ESRD: on HD MWF. Next HD 1/12. Pt requesting 1st shift.  Volume: looks euvolemic, BP's stable/ soft and below dry wt. BP: chronic low, cont midodrine  10mg  tid here (was taking lower dose 10mg  pre HD at home). Declines IV albumin / blood products.  Anemia of ESRD: cont esa darbe 40mcg sq weekly R sided weakness: main c/o on admission, CVA w/u was negative Septic arthritis R knee: SP I&D 1/09 per orthopedics  Myer Fret  MD  [facility] 02/22/2024, 11:58 AM  Recent Labs  Lab 02/21/24 0505 02/21/24 1237 02/22/24 0534  HGB 10.4* 9.5*  --   ALBUMIN  2.9*  --  2.8*  CALCIUM  8.9  --  8.9  PHOS 5.7*  --  5.4*  CREATININE 7.62* 4.50* 5.37*  K 5.5* 4.1 5.7*    Inpatient medications:  (feeding supplement) PROSource Plus  30 mL Oral BID BM   amoxicillin -clavulanate  1 tablet Oral BID   aspirin  EC  81 mg Oral Daily   atorvastatin   10 mg Oral Daily   bupivacaine (PF)  10 mL Infiltration Once   Chlorhexidine  Gluconate Cloth  6 each Topical Q0600   cinacalcet   120 mg Oral Q breakfast   ciprofloxacin -dexamethasone   4 drop Left EAR BID   clopidogrel   75  mg Oral Daily   darbepoetin (ARANESP ) injection - DIALYSIS  40 mcg Subcutaneous Q Fri-1800   gabapentin   300 mg Oral QHS   insulin  aspart  0-6 Units Subcutaneous TID WC   lidocaine   1 patch Transdermal Q24H   lidocaine   1 patch Transdermal Q24H   linezolid   600 mg Oral Q12H   midodrine   10 mg Oral TID WC   multivitamin  1 tablet Oral QHS   pantoprazole   40 mg Oral BID AC   psyllium  1 packet Oral Daily   sevelamer  carbonate  2,400 mg Oral TID WC   sodium chloride  flush  3 mL Intravenous Q12H   sodium zirconium cyclosilicate   10 g Oral TID      acetaminophen  **OR** acetaminophen , cyclobenzaprine , diphenhydrAMINE , melatonin, midodrine , oxyCODONE -acetaminophen        "

## 2024-02-22 NOTE — Evaluation (Signed)
 Physical Therapy Evaluation Patient Details Name: Carl Ferris Sr. MRN: [id number] DOB: Jul 02, 1960 Today's Date: 02/22/2024  History of Present Illness  Pt is a 64 y.o. male who presented 02/07/24 with progressive R-sided weakness and pain along with L ear pain and drainage. Pt septic with L acute otitis media. MRI brain showed no acute findings. MRI cervical spine demonstrated no interval change from prior surgery and no clear etiology for current weakness. Imaging did note prior left-sided strokes, without evidence of acute stroke or recrudescence. Neurosurgery consulted and recommends no acute intervention. R weakness suspected recrudescence of prior symptoms in setting of severe infection. PMH includes: recurrent GIB, epilepsy, PRES, TIA, HTN, renal cell carcinoma, CVA, OSA, ESRD, DM II, Ischemic colitis, anterior cervical corpectomy C4 and C3-C5 fusion (Dr. [name], 05/25/2023), PAD s/p angioplasty/stenting right SFA and popliteal arteries 12/09/2023 for critical limb ischemia.   Clinical Impression  Pt is currently mobilizing below his baseline due to R knee pain and weakness s/p I&D on 1/9.  Pt requires modA for bed mobility for R LE management and completes transitional movements in segments due to pain. Pt with difficulty repositioning while EOB due to NWB precautions. STS x2 attempted with maxA but full stance was unable to be achieved due to L LE difficulty bearing 100% body weight. Elected to transfer to chair using lateral scoot with maxA to clear hips and boost over to chair. Attempt x2 needed to complete transfer. Pt encouraged to perform ankle pumps and quad sets while in the chair to promote muscle activation and blood flow post op. Pt would benefit from continued PT services focused on bed mobility, ROM, strength, transfers, and progress back to gait to promote further functional mobility. Pt remains appropriate for 3hrs rehab upon discharge.         If plan is discharge home, recommend  the following: Two people to help with walking and/or transfers;Two people to help with bathing/dressing/bathroom;Assistance with cooking/housework;Direct supervision/assist for medications management;Direct supervision/assist for financial management;Assist for transportation;Help with stairs or ramp for entrance   Can travel by private vehicle        Equipment Recommendations BSC/3in1  Recommendations for Other Services       Functional Status Assessment Patient has had a recent decline in their functional status and demonstrates the ability to make significant improvements in function in a reasonable and predictable amount of time.     Precautions / Restrictions Precautions Precautions: Fall Recall of Precautions/Restrictions: Impaired Restrictions Weight Bearing Restrictions Per Provider Order: Yes RLE Weight Bearing Per Provider Order: Non weight bearing      Mobility  Bed Mobility Overal bed mobility: Needs Assistance Bed Mobility: Supine to Sit     Supine to sit: Mod assist     General bed mobility comments: Assist for R LE management. Attempts to reposition EOB but is unable while maintaining NWB on R side. Bed mobility completed in segments due to pain.    Transfers Overall transfer level: Needs assistance Equipment used: Rolling walker (2 wheels), None Transfers: Sit to/from Stand, Bed to chair/wheelchair/BSC Sit to Stand: Max assist          Lateral/Scoot Transfers: Max assist General transfer comment: STS attempted EOB with elevated bed height and rolling walker. Pt reminded of NWB precautions. First STS attempt, unable to clear hips from seated surface. Second attempt, briefly able to clear hips but unable to stand. Elected to laterally scoot with assist to recliner. Attempt x2 completed with maxA to complete transfer. Life pad in place  for return to bed.    Ambulation/Gait                  Stairs            Wheelchair Mobility     Tilt  Bed    Modified Rankin (Stroke Patients Only)       Balance Overall balance assessment: Needs assistance Sitting-balance support: Feet supported Sitting balance-Leahy Scale: Fair Sitting balance - Comments: supervision sitting EOB       Standing balance comment: Unable to come to full stance this session without x2 assist.                             Pertinent Vitals/Pain Pain Assessment Pain Assessment: 0-10 Pain Score: 7  Pain Location: R knee Pain Descriptors / Indicators: Aching, Sore, Sharp, Tightness, Grimacing Pain Intervention(s): Limited activity within patient's tolerance, Repositioned, Monitored during session, Patient requesting pain meds-RN notified    Home Living Family/patient expects to be discharged to:: Private residence Living Arrangements: Children Available Help at Discharge: Family;Available 24 hours/day Type of Home: Apartment Home Access: Level entry       Home Layout: One level Home Equipment: Rolling Walker (2 wheels);Grab bars - tub/shower;Toilet riser;Wheelchair - manual      Prior Function Prior Level of Function : Independent/Modified Independent             Mobility Comments: Mod I using RW; x3 falls in past month due to R knee buckling       Extremity/Trunk Assessment   Upper Extremity Assessment Upper Extremity Assessment: Defer to OT evaluation    Lower Extremity Assessment RLE Deficits / Details: Performs dorsiflexion/plantarflexion within minimal range. Unable to demonstrate quad contraction at this time. Reports numbness in thigh area. Swelling noted. 7/10 pain. NWB at this time.    Cervical / Trunk Assessment Cervical / Trunk Assessment: Kyphotic  Communication   Communication Communication: No apparent difficulties    Cognition Arousal: Alert Behavior During Therapy: WFL for tasks assessed/performed   PT - Cognitive impairments: No apparent impairments                          Following commands: Intact       Cueing Cueing Techniques: Verbal cues, Tactile cues, Visual cues     General Comments      Exercises     Assessment/Plan    PT Assessment Patient needs continued PT services  PT Problem List Decreased strength;Decreased mobility;Decreased safety awareness;Decreased range of motion;Decreased coordination;Decreased knowledge of precautions;Decreased activity tolerance;Decreased skin integrity;Decreased balance;Decreased knowledge of use of DME;Pain       PT Treatment Interventions DME instruction;Therapeutic exercise;Gait training;Balance training;Wheelchair mobility training;Stair training;Neuromuscular re-education;Functional mobility training;Therapeutic activities;Patient/family education    PT Goals (Current goals can be found in the Care Plan section)  Acute Rehab PT Goals Patient Stated Goal: Get back to walking PT Goal Formulation: With patient Time For Goal Achievement: 03/07/24 Potential to Achieve Goals: Good    Frequency Min 3X/week     Co-evaluation               AM-PAC PT 6 Clicks Mobility  Outcome Measure Help needed turning from your back to your side while in a flat bed without using bedrails?: A Little Help needed moving from lying on your back to sitting on the side of a flat bed without using bedrails?: A Lot Help needed moving  to and from a bed to a chair (including a wheelchair)?: A Lot Help needed standing up from a chair using your arms (e.g., wheelchair or bedside chair)?: Total Help needed to walk in hospital room?: Total Help needed climbing 3-5 steps with a railing? : Total 6 Click Score: 10    End of Session Equipment Utilized During Treatment: Gait belt Activity Tolerance: Patient limited by pain;Patient limited by fatigue Patient left: in chair;with call bell/phone within reach;with chair alarm set Nurse Communication: Mobility status;Need for lift equipment;Patient requests pain meds;Weight  bearing status PT Visit Diagnosis: Unsteadiness on feet (R26.81);Muscle weakness (generalized) (M62.81);Pain Pain - Right/Left: Right Pain - part of body: Knee    Time: 8699-8665 PT Time Calculation (min) (ACUTE ONLY): 34 min   Charges:   PT Evaluation $PT Re-evaluation: 1 Re-eval PT Treatments $Therapeutic Activity: 8-22 mins PT General Charges $$ ACUTE PT VISIT: 1 Visit         Sabra Morel, PT, DPT  Acute Rehabilitation Services         Office: [phone]     Sabra MARLA Morel 02/22/2024, 4:05 PM

## 2024-02-23 DIAGNOSIS — H66012 Acute suppurative otitis media with spontaneous rupture of ear drum, left ear: Secondary | ICD-10-CM | POA: Diagnosis not present

## 2024-02-23 LAB — RENAL FUNCTION PANEL
Albumin: 2.8 g/dL — ABNORMAL LOW (ref 3.5–5.0)
Anion gap: 18 — ABNORMAL HIGH (ref 5–15)
BUN: 61 mg/dL — ABNORMAL HIGH (ref 8–23)
CO2: 26 mmol/L (ref 22–32)
Calcium: 8.6 mg/dL — ABNORMAL LOW (ref 8.9–10.3)
Chloride: 91 mmol/L — ABNORMAL LOW (ref 98–111)
Creatinine, Ser: 7.43 mg/dL — ABNORMAL HIGH (ref 0.61–1.24)
GFR, Estimated: 8 mL/min — ABNORMAL LOW
Glucose, Bld: 83 mg/dL (ref 70–99)
Phosphorus: 6.5 mg/dL — ABNORMAL HIGH (ref 2.5–4.6)
Potassium: 4.6 mmol/L (ref 3.5–5.1)
Sodium: 135 mmol/L (ref 135–145)

## 2024-02-23 LAB — CBC
HCT: 24.7 % — ABNORMAL LOW (ref 39.0–52.0)
Hemoglobin: 7.9 g/dL — ABNORMAL LOW (ref 13.0–17.0)
MCH: 29 pg (ref 26.0–34.0)
MCHC: 32 g/dL (ref 30.0–36.0)
MCV: 90.8 fL (ref 80.0–100.0)
Platelets: 474 K/uL — ABNORMAL HIGH (ref 150–400)
RBC: 2.72 MIL/uL — ABNORMAL LOW (ref 4.22–5.81)
RDW: 15.8 % — ABNORMAL HIGH (ref 11.5–15.5)
WBC: 9.9 K/uL (ref 4.0–10.5)
nRBC: 0 % (ref 0.0–0.2)

## 2024-02-23 LAB — GLUCOSE, CAPILLARY
Glucose-Capillary: 94 mg/dL (ref 70–99)
Glucose-Capillary: 94 mg/dL (ref 70–99)
Glucose-Capillary: 99 mg/dL (ref 70–99)
Glucose-Capillary: 91 mg/dL (ref 70–99)

## 2024-02-23 MED ORDER — FERROUS SULFATE 325 (65 FE) MG PO TABS
325.0000 mg | ORAL_TABLET | Freq: Every day | ORAL | Status: DC
  Administered 2024-02-23 – 2024-03-03 (×9): 325 mg via ORAL
  Filled 2024-02-23 (×8): qty 1

## 2024-02-23 MED ORDER — FOLIC ACID 1 MG PO TABS
1.0000 mg | ORAL_TABLET | Freq: Every day | ORAL | Status: DC
  Administered 2024-02-23 – 2024-03-03 (×9): 1 mg via ORAL
  Filled 2024-02-23 (×9): qty 1

## 2024-02-23 MED ORDER — CHLORHEXIDINE GLUCONATE CLOTH 2 % EX PADS
6.0000 | MEDICATED_PAD | Freq: Every day | CUTANEOUS | Status: DC
  Administered 2024-02-24 – 2024-03-03 (×8): 6 via TOPICAL

## 2024-02-23 MED ORDER — SODIUM ZIRCONIUM CYCLOSILICATE 10 G PO PACK: 10.0000 g | PACK | Freq: Every day | ORAL | Status: DC

## 2024-02-23 MED ORDER — CIPROFLOXACIN-DEXAMETHASONE 0.3-0.1 % OT SUSP
4.0000 [drp] | Freq: Two times a day (BID) | OTIC | Status: AC
  Administered 2024-02-23 – 2024-02-24 (×4): 4 [drp] via OTIC
  Filled 2024-02-23: qty 7.5

## 2024-02-23 NOTE — Plan of Care (Signed)
" °  Problem: Education: Goal: Knowledge of General Education information will improve Description: Including pain rating scale, medication(s)/side effects and non-pharmacologic comfort measures Outcome: Progressing   Problem: Health Behavior/Discharge Planning: Goal: Ability to manage health-related needs will improve Outcome: Progressing   Problem: Clinical Measurements: Goal: Ability to maintain clinical measurements within normal limits will improve Outcome: Progressing Goal: Will remain free from infection Outcome: Progressing Goal: Diagnostic test results will improve Outcome: Progressing Goal: Respiratory complications will improve Outcome: Progressing Goal: Cardiovascular complication will be avoided Outcome: Progressing   Problem: Activity: Goal: Risk for activity intolerance will decrease Outcome: Progressing   Problem: Nutrition: Goal: Adequate nutrition will be maintained Outcome: Progressing   Problem: Coping: Goal: Level of anxiety will decrease Outcome: Progressing   Problem: Elimination: Goal: Will not experience complications related to bowel motility Outcome: Progressing Goal: Will not experience complications related to urinary retention Outcome: Progressing   Problem: Pain Managment: Goal: General experience of comfort will improve and/or be controlled Outcome: Progressing   Problem: Safety: Goal: Ability to remain free from injury will improve Outcome: Progressing   Problem: Skin Integrity: Goal: Risk for impaired skin integrity will decrease Outcome: Progressing   Problem: Education: Goal: Ability to describe self-care measures that may prevent or decrease complications (Diabetes Survival Skills Education) will improve Outcome: Progressing Goal: Individualized Educational Video(s) Outcome: Progressing   Problem: Coping: Goal: Ability to adjust to condition or change in health will improve Outcome: Progressing   Problem: Fluid  Volume: Goal: Ability to maintain a balanced intake and output will improve Outcome: Progressing   Problem: Health Behavior/Discharge Planning: Goal: Ability to identify and utilize available resources and services will improve Outcome: Progressing Goal: Ability to manage health-related needs will improve Outcome: Progressing   Problem: Metabolic: Goal: Ability to maintain appropriate glucose levels will improve Outcome: Progressing   Problem: Nutritional: Goal: Maintenance of adequate nutrition will improve Outcome: Progressing Goal: Progress toward achieving an optimal weight will improve Outcome: Progressing   Problem: Skin Integrity: Goal: Risk for impaired skin integrity will decrease Outcome: Progressing   Problem: Tissue Perfusion: Goal: Adequacy of tissue perfusion will improve Outcome: Progressing   Problem: Education: Goal: Knowledge of the prescribed therapeutic regimen will improve Outcome: Progressing   Problem: Bowel/Gastric: Goal: Gastrointestinal status for postoperative course will improve Outcome: Progressing   Problem: Cardiac: Goal: Ability to maintain an adequate cardiac output Outcome: Progressing Goal: Will show no evidence of cardiac arrhythmias Outcome: Progressing   Problem: Nutritional: Goal: Will attain and maintain optimal nutritional status Outcome: Progressing   Problem: Neurological: Goal: Will regain or maintain usual level of consciousness Outcome: Progressing   Problem: Clinical Measurements: Goal: Ability to maintain clinical measurements within normal limits Outcome: Progressing Goal: Postoperative complications will be avoided or minimized Outcome: Progressing   Problem: Respiratory: Goal: Will regain and/or maintain adequate ventilation Outcome: Progressing Goal: Respiratory status will improve Outcome: Progressing   Problem: Skin Integrity: Goal: Demonstrates signs of wound healing without infection Outcome:  Progressing   Problem: Urinary Elimination: Goal: Will remain free from infection Outcome: Progressing Goal: Ability to achieve and maintain adequate urine output Outcome: Progressing   "

## 2024-02-23 NOTE — Progress Notes (Signed)
 " PROGRESS NOTE    Carl Purves Sr.  FMW:[id number] DOB: [birth year] DOA: 02/08/2024 PCP: [name], MD   Brief Narrative: 64 year old with past medical history significant for ESRD on hemodialysis MWF, PVD s/p angioplasty right S FA October 2025 for ischemic rest pain as well as C4 spinal fracture April 2025 with incomplete spinal cord injury requiring C4 corpectomy, fusion and interbody cage by Dr. [name], wheelchair-bound for a time but progressed by Agurs to walking with a cane and driving, presented with right arm and leg weakness, fall as well as posterior neck pain, fever, ear drainage.  Admitted started on antibiotics, neurosurgery, neurology orthopedic ENT and nephrology consulted.  Mildly diagnosed with otitis media and ruptured eardrum.  Right-sided weakness believed to be recrudescence of old spinal cord injury symptoms in the setting of infection.  Denied  to CIR placement by insurance.  Knee synovial fluid consistent with septic arthritis, underwent irrigation and debridement by Dr. [name] on 1/9.   Assessment & Plan:   Principal Problem:   Sepsis (HCC) Active Problems:   GERD (gastroesophageal reflux disease)   HLD (hyperlipidemia)   Essential hypertension   Diarrhea, unspecified   ESRD (end stage renal disease) (HCC)   Obstructive sleep apnea   Type 2 diabetes mellitus with diabetic peripheral angiopathy without gangrene (HCC)   Acute suppurative otitis media of left ear with spontaneous rupture of tympanic membrane   Mastoid disorder, bilateral   Conductive hearing loss   Acute right-sided weakness   Septic arthritis of knee, right (HCC)   1-Sepsis due to otitis media with suspected TM perforation and Right Knee septic arthritis.  - Patient met sepsis criteria on admission suspected due to suppurative otitis media.  Blood cultures no growth to date.  COVID flu RSV negative - ENT [name] was consulted and patient underwent CT of the temporal bones without  evidence of coalescent mastoiditis, bone breakdown or abscess -He completed 10 days of antibiotics for otitis.  - Patient had low-grade fever 1/6. No further fever since yesterday. If spike fever will need repeat blood cultures, workup -needs follow up with ENT on January 5 -- 12th. - Continue with  Ciprodex  drops, ENT recommend it for 14 days.  Resume antibiotics to cover knee infection.   Right-sided weakness History of cervical decompression corpectomy C4 by [name] April 2025 Remote left occipital infarct - There was some concern for recurrent spinal cord injury, neurosurgery and neurology were consulted.  A spinal MRI showed no new cord injury. - It was thought that his symptoms were recrudescence of old  symptoms due to  acute infection.   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  Had knee aspiration and injection on  1/06  -knee synovial fluid, no growth to date.  -WBC Synovial fluid notice to be 71,000.  -IV antibiotics order, patient refuse IV access, he understand risk of infection , he would only take oral antibiotics.  -Continue with  Linezolid  and Augmentin .  BP improved.  Will consult ID tomorrow for antibiotics length   -Peripheral vascular disease s/p angioplasty October 2025; - Continue aspirin  Lipitor and Plavix   ESRD on hemodialysis: MWF - Nephrology following.   -Had HD 1/09. -Lokelma  change to daily   Chronic hypotension, asymptomatic -Continue midodrine  Refuse meds at times.  ECHO normal EF. 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  Per cardiology small mobile echodensity on the ventricular aspect of the aortic valve is likely degenerative changes of the valve.  Also blood cultures has been negative.  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 He would not accept blood transfusion do to religious believes.  Monitor hemoglobin    Nutrition Problem: Increased nutrient needs Etiology: chronic illness    Signs/Symptoms: estimated needs    Interventions: Refer to RD note for recommendations, MVI  Estimated body mass index is 24.64 kg/m as calculated from the following:   Height as of this encounter: 6' 1 (1.854 m).   Weight as of this encounter: 84.7 kg.   DVT prophylaxis: Heparin  Code Status: Full code Family Communication: Care discussed with patient Disposition Plan:  Status is: Inpatient Remains inpatient appropriate because: Awaiting skilled nursing facility    Consultants:  Nephrology ENT Neurology  Procedures:  HD  Antimicrobials:    Subjective: He is alert, denies worsening knee pain.  He doesn't accept blood transfusion due to religious believes. He has accepted platelt/ plasma in the past   Objective: Vitals:   02/23/24 0500 02/23/24 0515 02/23/24 0740 02/23/24 1110  BP:  113/65 92/61 108/68  Pulse:  63 82 70  Resp:  16 17 17   Temp:  (!) 97.3 F (36.3 C) 98 F (36.7 C) 97.9 F (36.6 C)  TempSrc:   Oral Oral  SpO2:  95% 100% 100%  Weight: 84.7 kg     Height:        Intake/Output Summary (Last 24 hours) at 02/23/2024 1340 Last data filed at 02/22/2024 1700 Gross per 24 hour  Intake --  Output 45 ml  Net -45 ml   Filed Weights   02/21/24 1158 02/22/24 0500 02/23/24 0500  Weight: 86.8 kg 86.8 kg 84.7 kg    Examination:  General exam; NAD Respiratory system: CTA Cardiovascular system: S 1, S 2 RRR Gastrointestinal system: BS present, soft, nt Central nervous system: Alert Extremities: Right knee with dressing     Data Reviewed: I have personally reviewed following labs and imaging studies  CBC: Recent Labs   Lab 02/18/24 1823 02/21/24 0505 02/21/24 1237 02/23/24 1053  WBC 9.0 7.2  --  9.9  NEUTROABS 7.2  --   --   --   HGB 9.9* 10.4* 9.5* 7.9*  HCT 30.2* 33.5* 28.0* 24.7*  MCV 88.6 93.8  --  90.8  PLT 501* 517*  --  474*   Basic Metabolic Panel: Recent Labs  Lab 02/19/24 0256 02/20/24 0332 02/21/24 0505 02/21/24 1237 02/22/24 0534 02/23/24 0557  NA 132* 136 133* 134* 133* 135  K 5.8* 4.8 5.5* 4.1 5.7* 4.6  CL 87* 92* 90* 93* 92* 91*  CO2 24 29 24   --  26 26  GLUCOSE 99 76 84 76 105* 83  BUN 60* 36* 59* 32* 42* 61*  CREATININE 8.54* 5.62* 7.62* 4.50* 5.37* 7.43*  CALCIUM  9.3 9.4 8.9  --  8.9 8.6*  PHOS 6.5* 4.5 5.7*  --  5.4* 6.5*   GFR: Estimated Creatinine Clearance: 11.2 mL/min (A) (by C-G formula based on SCr of 7.43 mg/dL (H)). Liver Function Tests: Recent Labs  Lab 02/19/24 0256 02/20/24 0332 02/21/24 0505 02/22/24 0534 02/23/24 0557  ALBUMIN  2.9* 3.1* 2.9* 2.8* 2.8*   No results for input(s): LIPASE, AMYLASE in the last 168 hours. No results  for input(s): AMMONIA in the last 168 hours. Coagulation Profile: No results for input(s): INR, PROTIME in the last 168 hours. Cardiac Enzymes: No results for input(s): CKTOTAL, CKMB, CKMBINDEX, TROPONINI in the last 168 hours. BNP (last 3 results) No results for input(s): PROBNP in the last 8760 hours. HbA1C: No results for input(s): HGBA1C in the last 72 hours. CBG: Recent Labs  Lab 02/22/24 1122 02/22/24 1638 02/22/24 2214 02/23/24 0744 02/23/24 1112  GLUCAP 110* 115* 120* 94 94   Lipid Profile: No results for input(s): CHOL, HDL, LDLCALC, TRIG, CHOLHDL, LDLDIRECT in the last 72 hours. Thyroid  Function Tests: No results for input(s): TSH, T4TOTAL, FREET4, T3FREE, THYROIDAB in the last 72 hours. Anemia Panel: No results for input(s): VITAMINB12, FOLATE, FERRITIN, TIBC, IRON, RETICCTPCT in the last 72 hours. Sepsis Labs: Recent Labs  Lab  02/18/24 1823  LATICACIDVEN 1.2    Recent Results (from the past 240 hours)  Body fluid culture w Gram Stain     Status: None   Collection Time: 02/18/24  2:24 PM   Specimen: Synovium; Body Fluid  Result Value Ref Range Status   Specimen Description SYNOVIAL  Final   Special Requests RIGHT KNEE  Final   Gram Stain   Final    ABUNDANT WBC PRESENT, PREDOMINANTLY PMN NO ORGANISMS SEEN    Culture   Final    NO GROWTH 3 DAYS Performed at [facility], [address], [place], [name]    Report Status 02/22/2024 FINAL  Final         Radiology Studies: No results found.       Scheduled Meds:  (feeding supplement) PROSource Plus  30 mL Oral BID BM   amoxicillin -clavulanate  1 tablet Oral BID   aspirin  EC  81 mg Oral Daily   atorvastatin   10 mg Oral Daily   bupivacaine (PF)  10 mL Infiltration Once   Chlorhexidine  Gluconate Cloth  6 each Topical Q0600   [START ON 02/24/2024] Chlorhexidine  Gluconate Cloth  6 each Topical Q0600   cinacalcet   120 mg Oral Q breakfast   ciprofloxacin -dexamethasone   4 drop Left EAR BID   clopidogrel   75 mg Oral Daily   darbepoetin (ARANESP ) injection - DIALYSIS  40 mcg Subcutaneous Q Fri-1800   gabapentin   300 mg Oral QHS   insulin  aspart  0-6 Units Subcutaneous TID WC   lidocaine   1 patch Transdermal Q24H   lidocaine   1 patch Transdermal Q24H   linezolid   600 mg Oral Q12H   midodrine   10 mg Oral TID WC   multivitamin  1 tablet Oral QHS   pantoprazole   40 mg Oral BID AC   psyllium  1 packet Oral Daily   sevelamer  carbonate  2,400 mg Oral TID WC   sodium chloride  flush  3 mL Intravenous Q12H   [START ON 02/24/2024] sodium zirconium cyclosilicate   10 g Oral Daily   Continuous Infusions:   LOS: 12 days    Time spent: 35 Minutes    Tom Macpherson A Felecia Stanfill, MD [facility]   If 7PM-7AM, please contact night-coverage [email or web address]  02/23/2024, 1:40 PM   "

## 2024-02-23 NOTE — Progress Notes (Signed)
 OT Cancellation Note  Patient Details Name: Carl Bienvenue Sr. MRN: [id number] DOB: 1960-08-29   Cancelled Treatment:    Reason Eval/Treat Not Completed: Patient declined, no reason specified (patient eating lunch at 2:15pm  Requesting therapy to come back tomorrow)  [name] 02/23/2024, 2:33 PM

## 2024-02-23 NOTE — Progress Notes (Addendum)
 " [facility] Progress Note   Subjective:    No c/o's Seen in room Up in bed, eating breakfast  Presentation summary: 64 year old with past medical history significant for esrd on HD mwf, PVD s/p angioplasty right SFA October 2025 for ischemic rest pain, also C4 spinal fracture April 2025 with incomplete spinal cord injury requiring C4 corpectomy, fusion and interbody cage by Dr. [name], wheelchair-bound for a time but progressed by August to walking with a cane and driving. Pt was admitted with new onset right sided weakness. Symptoms started about a week ago. Unable move right leg and has been falling at home. No acute CVA on brain MRI. Cervical spine MRI demonstrates residual mild stenosis C3-4. Neurosurgery consulted. Labs Na 134, K 5.2, BUN 61, WBC 12.1, Hgb 10.1. Febrile on arrival temp 102.1. Blood cultures collected. Receiving antibiotics for suspected otitis media. Presented with ear pain and drainage. Seen and examined in the ED. Progressive right sided weakness. Difficult to lift right arm, leg. He says had similar symptoms when he needed surgery in April. Denies chest pain, sob, nausea/vomiting.  HD MWF. Last dialysis Friday. He usually drives himself but son had to drive d/t weakness. He did complete full treatment. Meeting dry weight. Would be due for dialysis today per his OP clinic holiday schedule.    Objective Vitals:   02/23/24 0038 02/23/24 0500 02/23/24 0515 02/23/24 0740  BP: 101/62  113/65 92/61  Pulse: 64  63 82  Resp: 18  16 17   Temp: 98.1 F (36.7 C)  (!) 97.3 F (36.3 C) 98 F (36.7 C)  TempSrc:    Oral  SpO2: 94%  95% 100%  Weight:  84.7 kg    Height:       Physical Exam General: alert male in NAD Heart: RRR, no murmur Lungs: CTA bilaterally Abdomen: soft, non-distended Extremities: no edema b/l lower extremities R knee drain in place Dialysis Access:  AVF+ bruit  Home bp meds: Midodrine  10mg  pre hd mwf   Dialysis Orders: MWF NW 4h   B400  87.3kg  2K bath  AVF   Heparin  3000 ESA: Mircera 50 q 2 wks (last 12/15) BMM: Calcitriol  2 mcg PO q HD, cinacalcet  120 mg daily   Assessment/Plan: ESRD: on HD MWF. Next HD monday. Pt requests 1st shift.  Volume: euvolemic, BP's low-nl, below dry wt; lower dry wt on dc. UF 1-2 w/ hd BP: chronic low, midodrine  10mg  tid here (10mg  pre HD at home) Anemia of ESRD: cont esa darbe 40mcg sq weekly, declines IV alb/ prbc's  Hyperkalemia: persistent, mild, will lower lokelma  from 10gm tid to 10gm daily  Septic arthritis R knee: SP I&D 1/09 per ortho, refused IV so getting po abx Otitis media w/ TM perforation: s/p course of abx R sided weakness: main c/o on admission, CVA w/u was negative   Myer Fret  MD  [facility] 02/23/2024, 8:00 AM  Recent Labs  Lab 02/21/24 0505 02/21/24 1237 02/22/24 0534 02/23/24 0557  HGB 10.4* 9.5*  --   --   ALBUMIN  2.9*  --  2.8* 2.8*  CALCIUM  8.9  --  8.9 8.6*  PHOS 5.7*  --  5.4* 6.5*  CREATININE 7.62* 4.50* 5.37* 7.43*  K 5.5* 4.1 5.7* 4.6    Inpatient medications:  (feeding supplement) PROSource Plus  30 mL Oral BID BM   amoxicillin -clavulanate  1 tablet Oral BID   aspirin  EC  81 mg Oral Daily   atorvastatin   10 mg Oral Daily  bupivacaine (PF)  10 mL Infiltration Once   Chlorhexidine  Gluconate Cloth  6 each Topical Q0600   cinacalcet   120 mg Oral Q breakfast   ciprofloxacin -dexamethasone   4 drop Left EAR BID   clopidogrel   75 mg Oral Daily   darbepoetin (ARANESP ) injection - DIALYSIS  40 mcg Subcutaneous Q Fri-1800   gabapentin   300 mg Oral QHS   insulin  aspart  0-6 Units Subcutaneous TID WC   lidocaine   1 patch Transdermal Q24H   lidocaine   1 patch Transdermal Q24H   linezolid   600 mg Oral Q12H   midodrine   10 mg Oral TID WC   multivitamin  1 tablet Oral QHS   pantoprazole   40 mg Oral BID AC   psyllium  1 packet Oral Daily   sevelamer  carbonate  2,400 mg Oral TID WC   sodium chloride  flush  3 mL Intravenous Q12H   sodium zirconium  cyclosilicate  10 g Oral TID      acetaminophen  **OR** acetaminophen , cyclobenzaprine , diphenhydrAMINE , melatonin, midodrine , oxyCODONE -acetaminophen        "

## 2024-02-23 NOTE — Plan of Care (Signed)
   Problem: Nutrition: Goal: Adequate nutrition will be maintained Outcome: Progressing   Problem: Coping: Goal: Level of anxiety will decrease Outcome: Progressing   Problem: Safety: Goal: Ability to remain free from injury will improve Outcome: Progressing

## 2024-02-23 NOTE — Progress Notes (Signed)
 Patient stable.  Look surprisingly good considering all of his clinical experiences this hospitalization No fevers.  Minimal output from right knee Hemovac  Left knee no effusion but some synovitis from arthritis which is known.  No pain with rotation of either hip.  No pain with passive or active motion of the right shoulder  Plan at this time is discontinue Hemovac in the morning per Dr. [name].  No other joints appear involved at this time.  Will need a period of IV antibiotics with dialysis for this right knee joint infection.  Further intervention in the right knee with Dr.Xu and then later intervention in the shoulder on the right-hand side by myself will necessarily be delayed by this septic arthritis episode.

## 2024-02-24 ENCOUNTER — Encounter (HOSPITAL_COMMUNITY): Payer: Self-pay | Admitting: Orthopaedic Surgery

## 2024-02-24 DIAGNOSIS — M00861 Arthritis due to other bacteria, right knee: Secondary | ICD-10-CM

## 2024-02-24 DIAGNOSIS — I739 Peripheral vascular disease, unspecified: Secondary | ICD-10-CM

## 2024-02-24 DIAGNOSIS — M5185 Other intervertebral disc disorders, thoracolumbar region: Secondary | ICD-10-CM | POA: Diagnosis not present

## 2024-02-24 DIAGNOSIS — H748X3 Other specified disorders of middle ear and mastoid, bilateral: Secondary | ICD-10-CM | POA: Diagnosis not present

## 2024-02-24 DIAGNOSIS — E1122 Type 2 diabetes mellitus with diabetic chronic kidney disease: Secondary | ICD-10-CM | POA: Diagnosis not present

## 2024-02-24 DIAGNOSIS — Z992 Dependence on renal dialysis: Secondary | ICD-10-CM

## 2024-02-24 DIAGNOSIS — Z794 Long term (current) use of insulin: Secondary | ICD-10-CM

## 2024-02-24 DIAGNOSIS — I120 Hypertensive chronic kidney disease with stage 5 chronic kidney disease or end stage renal disease: Secondary | ICD-10-CM | POA: Diagnosis not present

## 2024-02-24 DIAGNOSIS — N186 End stage renal disease: Secondary | ICD-10-CM

## 2024-02-24 DIAGNOSIS — Z981 Arthrodesis status: Secondary | ICD-10-CM

## 2024-02-24 DIAGNOSIS — H66012 Acute suppurative otitis media with spontaneous rupture of ear drum, left ear: Secondary | ICD-10-CM | POA: Diagnosis not present

## 2024-02-24 DIAGNOSIS — Z96611 Presence of right artificial shoulder joint: Secondary | ICD-10-CM | POA: Diagnosis not present

## 2024-02-24 DIAGNOSIS — M009 Pyogenic arthritis, unspecified: Secondary | ICD-10-CM | POA: Diagnosis present

## 2024-02-24 LAB — CBC
HCT: 25.3 % — ABNORMAL LOW (ref 39.0–52.0)
Hemoglobin: 8.1 g/dL — ABNORMAL LOW (ref 13.0–17.0)
MCH: 29.1 pg (ref 26.0–34.0)
MCHC: 32 g/dL (ref 30.0–36.0)
MCV: 91 fL (ref 80.0–100.0)
Platelets: 478 K/uL — ABNORMAL HIGH (ref 150–400)
RBC: 2.78 MIL/uL — ABNORMAL LOW (ref 4.22–5.81)
RDW: 15.6 % — ABNORMAL HIGH (ref 11.5–15.5)
WBC: 9.5 K/uL (ref 4.0–10.5)
nRBC: 0 % (ref 0.0–0.2)

## 2024-02-24 LAB — RENAL FUNCTION PANEL
Albumin: 3 g/dL — ABNORMAL LOW (ref 3.5–5.0)
Anion gap: 20 — ABNORMAL HIGH (ref 5–15)
BUN: 72 mg/dL — ABNORMAL HIGH (ref 8–23)
CO2: 24 mmol/L (ref 22–32)
Calcium: 8.3 mg/dL — ABNORMAL LOW (ref 8.9–10.3)
Chloride: 89 mmol/L — ABNORMAL LOW (ref 98–111)
Creatinine, Ser: 9.23 mg/dL — ABNORMAL HIGH (ref 0.61–1.24)
GFR, Estimated: 6 mL/min — ABNORMAL LOW
Glucose, Bld: 84 mg/dL (ref 70–99)
Phosphorus: 6.7 mg/dL — ABNORMAL HIGH (ref 2.5–4.6)
Potassium: 4.5 mmol/L (ref 3.5–5.1)
Sodium: 134 mmol/L — ABNORMAL LOW (ref 135–145)

## 2024-02-24 LAB — GLUCOSE, CAPILLARY
Glucose-Capillary: 80 mg/dL (ref 70–99)
Glucose-Capillary: 95 mg/dL (ref 70–99)
Glucose-Capillary: 81 mg/dL (ref 70–99)

## 2024-02-24 LAB — C-REACTIVE PROTEIN: CRP: 10.7 mg/dL — ABNORMAL HIGH

## 2024-02-24 LAB — SEDIMENTATION RATE: Sed Rate: >140 mm/h — ABNORMAL HIGH (ref 0–16)

## 2024-02-24 MED ORDER — VANCOMYCIN IV (FOR PTA / DISCHARGE USE ONLY): 1000.0000 mg | INTRAVENOUS | Status: DC

## 2024-02-24 MED ORDER — HEPARIN SODIUM (PORCINE) 1000 UNIT/ML IJ SOLN
INTRAMUSCULAR | Status: AC
  Filled 2024-02-24: qty 3

## 2024-02-24 MED ORDER — PENTAFLUOROPROP-TETRAFLUOROETH EX AERO: 1.0000 | INHALATION_SPRAY | CUTANEOUS | Status: DC | PRN

## 2024-02-24 MED ORDER — HEPARIN SODIUM (PORCINE) 1000 UNIT/ML DIALYSIS
1000.0000 [IU] | INTRAMUSCULAR | Status: AC | PRN
  Administered 2024-02-24: 1000 [IU] via INTRAVENOUS_CENTRAL

## 2024-02-24 MED ORDER — HEPARIN SODIUM (PORCINE) 1000 UNIT/ML DIALYSIS: 1000.0000 [IU] | INTRAMUSCULAR | Status: DC | PRN

## 2024-02-24 MED ORDER — HEPARIN SODIUM (PORCINE) 1000 UNIT/ML DIALYSIS
2000.0000 [IU] | Freq: Once | INTRAMUSCULAR | Status: AC
  Administered 2024-02-24: 2000 [IU] via INTRAVENOUS_CENTRAL

## 2024-02-24 MED ORDER — ALTEPLASE 2 MG IJ SOLR: 2.0000 mg | Freq: Once | INTRAMUSCULAR | Status: DC | PRN

## 2024-02-24 MED ORDER — LIDOCAINE-PRILOCAINE 2.5-2.5 % EX CREA: 1.0000 | TOPICAL_CREAM | CUTANEOUS | Status: DC | PRN

## 2024-02-24 MED ORDER — SODIUM CHLORIDE 0.9 % IV SOLN
2.0000 g | INTRAVENOUS | Status: DC
  Administered 2024-02-24 – 2024-03-02 (×4): 2 g via INTRAVENOUS
  Filled 2024-02-24 (×4): qty 12.5

## 2024-02-24 MED ORDER — VANCOMYCIN HCL IN DEXTROSE 1-5 GM/200ML-% IV SOLN
1000.0000 mg | INTRAVENOUS | Status: DC
  Administered 2024-02-26 – 2024-03-02 (×3): 1000 mg via INTRAVENOUS
  Filled 2024-02-24: qty 200

## 2024-02-24 MED ORDER — VANCOMYCIN HCL 2000 MG/400ML IV SOLN
2000.0000 mg | Freq: Once | INTRAVENOUS | Status: AC
  Administered 2024-02-24: 2000 mg via INTRAVENOUS
  Filled 2024-02-24: qty 400

## 2024-02-24 MED ORDER — ANTICOAGULANT SODIUM CITRATE 4% (200MG/5ML) IV SOLN: 5.0000 mL | Status: DC | PRN

## 2024-02-24 MED ORDER — CEFEPIME IV (FOR PTA / DISCHARGE USE ONLY): 2.0000 g | INTRAVENOUS | Status: DC

## 2024-02-24 MED ORDER — LIDOCAINE HCL (PF) 1 % IJ SOLN: 5.0000 mL | INTRAMUSCULAR | Status: DC | PRN

## 2024-02-24 NOTE — Plan of Care (Signed)
" °  Problem: Education: Goal: Knowledge of General Education information will improve Description: Including pain rating scale, medication(s)/side effects and non-pharmacologic comfort measures Outcome: Progressing   Problem: Health Behavior/Discharge Planning: Goal: Ability to manage health-related needs will improve Outcome: Progressing   Problem: Clinical Measurements: Goal: Ability to maintain clinical measurements within normal limits will improve Outcome: Progressing Goal: Will remain free from infection Outcome: Progressing Goal: Diagnostic test results will improve Outcome: Progressing Goal: Respiratory complications will improve Outcome: Progressing Goal: Cardiovascular complication will be avoided Outcome: Progressing   Problem: Activity: Goal: Risk for activity intolerance will decrease Outcome: Progressing   Problem: Nutrition: Goal: Adequate nutrition will be maintained Outcome: Progressing   Problem: Coping: Goal: Level of anxiety will decrease Outcome: Progressing   Problem: Elimination: Goal: Will not experience complications related to bowel motility Outcome: Progressing Goal: Will not experience complications related to urinary retention Outcome: Progressing   Problem: Pain Managment: Goal: General experience of comfort will improve and/or be controlled Outcome: Progressing   Problem: Safety: Goal: Ability to remain free from injury will improve Outcome: Progressing   Problem: Skin Integrity: Goal: Risk for impaired skin integrity will decrease Outcome: Progressing   Problem: Education: Goal: Ability to describe self-care measures that may prevent or decrease complications (Diabetes Survival Skills Education) will improve Outcome: Progressing Goal: Individualized Educational Video(s) Outcome: Progressing   Problem: Coping: Goal: Ability to adjust to condition or change in health will improve Outcome: Progressing   Problem: Fluid  Volume: Goal: Ability to maintain a balanced intake and output will improve Outcome: Progressing   Problem: Health Behavior/Discharge Planning: Goal: Ability to identify and utilize available resources and services will improve Outcome: Progressing Goal: Ability to manage health-related needs will improve Outcome: Progressing   Problem: Metabolic: Goal: Ability to maintain appropriate glucose levels will improve Outcome: Progressing   Problem: Nutritional: Goal: Maintenance of adequate nutrition will improve Outcome: Progressing Goal: Progress toward achieving an optimal weight will improve Outcome: Progressing   Problem: Skin Integrity: Goal: Risk for impaired skin integrity will decrease Outcome: Progressing   Problem: Tissue Perfusion: Goal: Adequacy of tissue perfusion will improve Outcome: Progressing   Problem: Education: Goal: Knowledge of the prescribed therapeutic regimen will improve Outcome: Progressing   Problem: Bowel/Gastric: Goal: Gastrointestinal status for postoperative course will improve Outcome: Progressing   Problem: Cardiac: Goal: Ability to maintain an adequate cardiac output Outcome: Progressing Goal: Will show no evidence of cardiac arrhythmias Outcome: Progressing   Problem: Nutritional: Goal: Will attain and maintain optimal nutritional status Outcome: Progressing   Problem: Neurological: Goal: Will regain or maintain usual level of consciousness Outcome: Progressing   Problem: Clinical Measurements: Goal: Ability to maintain clinical measurements within normal limits Outcome: Progressing Goal: Postoperative complications will be avoided or minimized Outcome: Progressing   Problem: Respiratory: Goal: Will regain and/or maintain adequate ventilation Outcome: Progressing Goal: Respiratory status will improve Outcome: Progressing   Problem: Skin Integrity: Goal: Demonstrates signs of wound healing without infection Outcome:  Progressing   Problem: Urinary Elimination: Goal: Will remain free from infection Outcome: Progressing Goal: Ability to achieve and maintain adequate urine output Outcome: Progressing   "

## 2024-02-24 NOTE — Progress Notes (Signed)
 Contacted by IR, reached out to clinic [facility] [place] regarding need for Vancomycin  1 gm with HD and cefepime  2 gm with HD through 2/9. Clinic informed of this. Clinic stated they do have available. Will continue to assist as needed.   [name] Dialysis Navigator [phone]

## 2024-02-24 NOTE — Progress Notes (Signed)
" °   02/24/24 1137  Vitals  Temp 97.8 F (36.6 C)  Pulse Rate 77  Resp 12  BP (!) 144/78  SpO2 100 %  O2 Device Room Air  Weight 85.4 kg  Type of Weight Post-Dialysis  Oxygen Therapy  Patient Activity (if Appropriate) In bed  Pulse Oximetry Type Continuous  Post Treatment  Dialyzer Clearance Clear  Liters Processed 84  Fluid Removed (mL) 1500 mL  Tolerated HD Treatment Yes  Post-Hemodialysis Comments Pt. tolerated tx well  AVG/AVF Arterial Site Held (minutes) 7 minutes  AVG/AVF Venous Site Held (minutes) 7 minutes    "

## 2024-02-24 NOTE — Addendum Note (Signed)
 Addendum  created 02/24/24 1538 by Erma Thom SAUNDERS, MD   SmartForm saved

## 2024-02-24 NOTE — Procedures (Signed)
 I was present at this dialysis session. I have reviewed the session itself and made appropriate changes.   Filed Weights   02/23/24 0500 02/24/24 0500 02/24/24 0746  Weight: 84.7 kg 85.9 kg 85.8 kg    Recent Labs  Lab 02/24/24 0506  NA 134*  K 4.5  CL 89*  CO2 24  GLUCOSE 84  BUN 72*  CREATININE 9.23*  CALCIUM  8.3*  PHOS 6.7*    Recent Labs  Lab 02/18/24 1823 02/21/24 0505 02/21/24 1237 02/23/24 1053 02/24/24 0757  WBC 9.0 7.2  --  9.9 9.5  NEUTROABS 7.2  --   --   --   --   HGB 9.9* 10.4* 9.5* 7.9* 8.1*  HCT 30.2* 33.5* 28.0* 24.7* 25.3*  MCV 88.6 93.8  --  90.8 91.0  PLT 501* 517*  --  474* 478*    Scheduled Meds:  (feeding supplement) PROSource Plus  30 mL Oral BID BM   aspirin  EC  81 mg Oral Daily   atorvastatin   10 mg Oral Daily   bupivacaine (PF)  10 mL Infiltration Once   Chlorhexidine  Gluconate Cloth  6 each Topical Q0600   Chlorhexidine  Gluconate Cloth  6 each Topical Q0600   cinacalcet   120 mg Oral Q breakfast   ciprofloxacin -dexamethasone   4 drop Left EAR BID   clopidogrel   75 mg Oral Daily   darbepoetin (ARANESP ) injection - DIALYSIS  40 mcg Subcutaneous Q Fri-1800   ferrous sulfate   325 mg Oral Q breakfast   folic acid   1 mg Oral Daily   gabapentin   300 mg Oral QHS   insulin  aspart  0-6 Units Subcutaneous TID WC   lidocaine   1 patch Transdermal Q24H   lidocaine   1 patch Transdermal Q24H   midodrine   10 mg Oral TID WC   multivitamin  1 tablet Oral QHS   pantoprazole   40 mg Oral BID AC   psyllium  1 packet Oral Daily   sevelamer  carbonate  2,400 mg Oral TID WC   sodium chloride  flush  3 mL Intravenous Q12H   Continuous Infusions:  anticoagulant sodium citrate      ceFEPime  (MAXIPIME ) IV 2 g (02/24/24 1024)   [START ON 02/26/2024] vancomycin      vancomycin  2,000 mg (02/24/24 1023)   PRN Meds:.acetaminophen  **OR** acetaminophen , alteplase , anticoagulant sodium citrate , cyclobenzaprine , diphenhydrAMINE , heparin , lidocaine  (PF),  lidocaine -prilocaine , melatonin, midodrine , oxyCODONE -acetaminophen , pentafluoroprop-tetrafluoroeth   Ephriam Stank, MD [facility] 02/24/2024, 10:46 AM

## 2024-02-24 NOTE — Progress Notes (Signed)
 " PROGRESS NOTE    Carl Lomeli Sr.  FMW:[id number] DOB: 19-Sep-1960 DOA: 02/08/2024 PCP: [name], MD   Brief Narrative: 64 year old with past medical history significant for ESRD on hemodialysis MWF, PVD s/p angioplasty right S FA October 2025 for ischemic rest pain as well as C4 spinal fracture April 2025 with incomplete spinal cord injury requiring C4 corpectomy, fusion and interbody cage by Dr. [name], wheelchair-bound for a time but progressed by Agurs to walking with a cane and driving, presented with right arm and leg weakness, fall as well as posterior neck pain, fever, ear drainage.  Admitted started on antibiotics, neurosurgery, neurology orthopedic ENT and nephrology consulted.  Mildly diagnosed with otitis media and ruptured eardrum.  Right-sided weakness believed to be recrudescence of old spinal cord injury symptoms in the setting of infection.  Denied  to CIR placement by insurance.  Knee synovial fluid consistent with septic arthritis, underwent irrigation and debridement by Dr. [name] on 1/9.ID consulted recommend 4 weeks of IV antibiotics with HD> Vancomycin  and cefepime . End date 03/23/2024   Assessment & Plan:   Principal Problem:   Arthritis, septic, knee (HCC) Active Problems:   GERD (gastroesophageal reflux disease)   HLD (hyperlipidemia)   Essential hypertension   Diarrhea, unspecified   ESRD (end stage renal disease) (HCC)   Obstructive sleep apnea   Type 2 diabetes mellitus with diabetic peripheral angiopathy without gangrene (HCC)   Sepsis (HCC)   Acute suppurative otitis media of left ear with spontaneous rupture of tympanic membrane   Mastoid disorder, bilateral   Conductive hearing loss   Acute right-sided weakness   Septic arthritis of knee, right (HCC)   1-Sepsis due to otitis media with suspected TM perforation and Right Knee septic arthritis.  - Patient met sepsis criteria on admission suspected due to suppurative otitis media.  Blood cultures  no growth to date.  COVID flu RSV negative - ENT [name] was consulted and patient underwent CT of the temporal bones without evidence of coalescent mastoiditis, bone breakdown or abscess -He completed 10 days of antibiotics for otitis.  - Patient had low-grade fever 1/6. No further fever since yesterday. If spike fever will need repeat blood cultures, workup -needs follow up with ENT on January 5 -- 12th. - Treated with  Ciprodex  drops, ENT recommend it for 14 days.  Resume antibiotics to cover knee infection.   Right-sided weakness History of cervical decompression corpectomy C4 by [name] April 2025 Remote left occipital infarct - There was some concern for recurrent spinal cord injury, neurosurgery and neurology were consulted.  A spinal MRI showed no new cord injury. - It was thought that his symptoms were recrudescence of old  symptoms due to  acute infection.   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  Had knee aspiration and injection on  1/06  -knee synovial fluid, no growth to date.  -WBC Synovial fluid notice to be 71,000.  -IV antibiotics order, patient refuse IV access, he understand risk of infection , he would only take oral antibiotics. Treated with Augmentin  and Linezolid .  ID consulted , recommend IV antibiotics with HD for 4 weeks.    -Peripheral vascular disease s/p angioplasty October 2025; - Continue aspirin  Lipitor and Plavix   ESRD on hemodialysis: MWF - Nephrology following.   -Lokelma  change to daily  HD today   Chronic hypotension, asymptomatic -Continue midodrine  Refuse meds at times.  ECHO normal EF. 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  Per cardiology small mobile echodensity on the ventricular aspect of the aortic valve is likely  degenerative changes of the valve.  Also blood cultures has been negative.  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 He would not accept blood transfusion do to religious believes.  Monitor hemoglobin    Nutrition Problem: Increased nutrient needs Etiology: chronic illness    Signs/Symptoms: estimated needs    Interventions: Refer to RD note for recommendations, MVI  Estimated body mass index is 24.96 kg/m as calculated from the following:   Height as of this encounter: 6' 1 (1.854 m).   Weight as of this encounter: 85.8 kg.   DVT prophylaxis: Heparin  Code Status: Full code Family Communication: Care discussed with patient Disposition Plan:  Status is: Inpatient Remains inpatient appropriate because: Awaiting skilled nursing facility    Consultants:  Nephrology ENT Neurology  Procedures:  HD  Antimicrobials:    Subjective: Seen in HD> no new concern.  He report right knee pain.    Objective: Vitals:   02/24/24 0746 02/24/24 0800 02/24/24 0833 02/24/24 0904  BP: 117/67 109/67 113/68 92/67  Pulse: 67 67 76 82  Resp: (!) 6 15 11 16   Temp: 97.8 F (36.6 C)     TempSrc:      SpO2: 100% 98% 100% 99%  Weight: 85.8 kg     Height:        Intake/Output Summary (Last 24 hours) at 02/24/2024 0911 Last data filed at 02/23/2024 1846 Gross per 24 hour  Intake --  Output 5 ml  Net -5 ml   Filed Weights   02/23/24 0500 02/24/24 0500 02/24/24 0746  Weight: 84.7 kg 85.9 kg 85.8 kg    Examination:  General exam; NAD Respiratory system: CTA Cardiovascular system: S 1, S 2 RRR Gastrointestinal system: BS present, soft, nt Central nervous system: Alert Extremities: Right knee with dressing     Data Reviewed: I have personally reviewed following labs and imaging studies  CBC: Recent Labs  Lab  02/18/24 1823 02/21/24 0505 02/21/24 1237 02/23/24 1053 02/24/24 0757  WBC 9.0 7.2  --  9.9 9.5  NEUTROABS 7.2  --   --   --   --   HGB 9.9* 10.4* 9.5* 7.9* 8.1*  HCT 30.2* 33.5* 28.0* 24.7* 25.3*  MCV 88.6 93.8  --  90.8 91.0  PLT 501* 517*  --  474* 478*   Basic Metabolic Panel: Recent Labs  Lab 02/20/24 0332 02/21/24 0505 02/21/24 1237 02/22/24 0534 02/23/24 0557 02/24/24 0506  NA 136 133* 134* 133* 135 134*  K 4.8 5.5* 4.1 5.7* 4.6 4.5  CL 92* 90* 93* 92* 91* 89*  CO2 29 24  --  26 26 24   GLUCOSE 76 84 76 105* 83 84  BUN 36* 59* 32* 42* 61* 72*  CREATININE 5.62* 7.62* 4.50* 5.37* 7.43* 9.23*  CALCIUM  9.4 8.9  --  8.9 8.6* 8.3*  PHOS 4.5 5.7*  --  5.4* 6.5* 6.7*   GFR: Estimated Creatinine Clearance: 9 mL/min (A) (by C-G formula based on SCr of 9.23 mg/dL (H)). Liver Function Tests: Recent Labs  Lab 02/20/24 0332 02/21/24 0505 02/22/24 0534 02/23/24 0557 02/24/24 0506  ALBUMIN  3.1* 2.9* 2.8* 2.8* 3.0*  No results for input(s): LIPASE, AMYLASE in the last 168 hours. No results for input(s): AMMONIA in the last 168 hours. Coagulation Profile: No results for input(s): INR, PROTIME in the last 168 hours. Cardiac Enzymes: No results for input(s): CKTOTAL, CKMB, CKMBINDEX, TROPONINI in the last 168 hours. BNP (last 3 results) No results for input(s): PROBNP in the last 8760 hours. HbA1C: No results for input(s): HGBA1C in the last 72 hours. CBG: Recent Labs  Lab 02/22/24 2214 02/23/24 0744 02/23/24 1112 02/23/24 1611 02/23/24 2135  GLUCAP 120* 94 94 99 91   Lipid Profile: No results for input(s): CHOL, HDL, LDLCALC, TRIG, CHOLHDL, LDLDIRECT in the last 72 hours. Thyroid  Function Tests: No results for input(s): TSH, T4TOTAL, FREET4, T3FREE, THYROIDAB in the last 72 hours. Anemia Panel: No results for input(s): VITAMINB12, FOLATE, FERRITIN, TIBC, IRON, RETICCTPCT in the last 72 hours. Sepsis  Labs: Recent Labs  Lab 02/18/24 1823  LATICACIDVEN 1.2    Recent Results (from the past 240 hours)  Body fluid culture w Gram Stain     Status: None   Collection Time: 02/18/24  2:24 PM   Specimen: Synovium; Body Fluid  Result Value Ref Range Status   Specimen Description SYNOVIAL  Final   Special Requests RIGHT KNEE  Final   Gram Stain   Final    ABUNDANT WBC PRESENT, PREDOMINANTLY PMN NO ORGANISMS SEEN    Culture   Final    NO GROWTH 3 DAYS Performed at [facility], [address] [place], [name]    Report Status 02/22/2024 FINAL  Final         Radiology Studies: No results found.       Scheduled Meds:  (feeding supplement) PROSource Plus  30 mL Oral BID BM   amoxicillin -clavulanate  1 tablet Oral BID   aspirin  EC  81 mg Oral Daily   atorvastatin   10 mg Oral Daily   bupivacaine (PF)  10 mL Infiltration Once   Chlorhexidine  Gluconate Cloth  6 each Topical Q0600   Chlorhexidine  Gluconate Cloth  6 each Topical Q0600   cinacalcet   120 mg Oral Q breakfast   ciprofloxacin -dexamethasone   4 drop Left EAR BID   clopidogrel   75 mg Oral Daily   darbepoetin (ARANESP ) injection - DIALYSIS  40 mcg Subcutaneous Q Fri-1800   ferrous sulfate   325 mg Oral Q breakfast   folic acid   1 mg Oral Daily   gabapentin   300 mg Oral QHS   insulin  aspart  0-6 Units Subcutaneous TID WC   lidocaine   1 patch Transdermal Q24H   lidocaine   1 patch Transdermal Q24H   linezolid   600 mg Oral Q12H   midodrine   10 mg Oral TID WC   multivitamin  1 tablet Oral QHS   pantoprazole   40 mg Oral BID AC   psyllium  1 packet Oral Daily   sevelamer  carbonate  2,400 mg Oral TID WC   sodium chloride  flush  3 mL Intravenous Q12H   sodium zirconium cyclosilicate   10 g Oral Daily   Continuous Infusions:  anticoagulant sodium citrate        LOS: 13 days    Time spent: 35 Minutes    Colby Catanese A Aarron Wierzbicki, MD [facility]   If 7PM-7AM, please contact  night-coverage [email or web address]  02/24/2024, 9:11 AM   "

## 2024-02-24 NOTE — Progress Notes (Signed)
 Subjective: 3 Days Post-Op Procedures (LRB): ARTHROSCOPY, KNEE (Right) Patient reports pain as mild.    Objective: Vital signs in last 24 hours: Temp:  [97.5 F (36.4 C)-98 F (36.7 C)] 97.8 F (36.6 C) (01/12 1137) Pulse Rate:  [64-85] 85 (01/12 1247) Resp:  [6-19] 16 (01/12 1247) BP: (92-144)/(63-78) 112/70 (01/12 1247) SpO2:  [96 %-100 %] 100 % (01/12 1247) Weight:  [85.4 kg-85.9 kg] 85.4 kg (01/12 1137)  Intake/Output from previous day: 01/11 0701 - 01/12 0700 In: -  Out: 5 [Drains:5] Intake/Output this shift: Total I/O In: -  Out: 1500 [Other:1500]  Recent Labs    02/23/24 1053 02/24/24 0757  HGB 7.9* 8.1*   Recent Labs    02/23/24 1053 02/24/24 0757  WBC 9.9 9.5  RBC 2.72* 2.78*  HCT 24.7* 25.3*  PLT 474* 478*   Recent Labs    02/23/24 0557 02/24/24 0506  NA 135 134*  K 4.6 4.5  CL 91* 89*  CO2 26 24  BUN 61* 72*  CREATININE 7.43* 9.23*  GLUCOSE 83 84  CALCIUM  8.6* 8.3*   No results for input(s): LABPT, INR in the last 72 hours.  Neurologically intact Neurovascular intact Sensation intact distally Intact pulses distally Dorsiflexion/Plantar flexion intact Incision: dressing C/D/I No cellulitis present Compartment soft Hemovac drain in place.  Minimal blood in canister   Assessment/Plan: 3 Days Post-Op Procedures (LRB): ARTHROSCOPY, KNEE (Right) Up with therapy WBAT RLE Hemovac drain pulled by me today Continue abx per ID/primary F/u with ortho two weeks post-op      Ronal LITTIE Grave 02/24/2024, 2:22 PM

## 2024-02-24 NOTE — Consult Note (Addendum)
 "       Date of Admission:  02/08/2024          Reason for Consult: Native septic arthritis    Referring Provider: Owen Lore, MD   Assessment:  Right septic native arthritis right knee sp I and D ESRD on HD Mastoid effusions and possible otitis Hx of right shoulder arthroplasty with fluid can collection seen on imaging T spine endplate abnormalities thought to be reactive Hx of Cervical spine surgery PVD   Plan:  Would treat with 4 weeks of postoperative cefepime  and vancomycin  with hemodialysis End date 03/23/24  Monitor for OTHER symptoms and shoulder pain, weakness recurring I will see him in clinic after he has completed his IV antibiotics and check some surveillance blood cultures and see how he is doing clinically Will check baseline ESR CRP Standard universal precautions I will sign off for now, please call with further questions.  Principal Problem:   Arthritis, septic, knee (HCC) Active Problems:   GERD (gastroesophageal reflux disease)   HLD (hyperlipidemia)   Essential hypertension   Diarrhea, unspecified   ESRD (end stage renal disease) (HCC)   Obstructive sleep apnea   Type 2 diabetes mellitus with diabetic peripheral angiopathy without gangrene (HCC)   Sepsis (HCC)   Acute suppurative otitis media of left ear with spontaneous rupture of tympanic membrane   Mastoid disorder, bilateral   Conductive hearing loss   Acute right-sided weakness   Septic arthritis of knee, right (HCC)   Scheduled Meds:  (feeding supplement) PROSource Plus  30 mL Oral BID BM   aspirin  EC  81 mg Oral Daily   atorvastatin   10 mg Oral Daily   bupivacaine (PF)  10 mL Infiltration Once   Chlorhexidine  Gluconate Cloth  6 each Topical Q0600   Chlorhexidine  Gluconate Cloth  6 each Topical Q0600   cinacalcet   120 mg Oral Q breakfast   ciprofloxacin -dexamethasone   4 drop Left EAR BID   clopidogrel   75 mg Oral Daily   darbepoetin (ARANESP ) injection - DIALYSIS  40 mcg  Subcutaneous Q Fri-1800   ferrous sulfate   325 mg Oral Q breakfast   folic acid   1 mg Oral Daily   gabapentin   300 mg Oral QHS   insulin  aspart  0-6 Units Subcutaneous TID WC   lidocaine   1 patch Transdermal Q24H   lidocaine   1 patch Transdermal Q24H   midodrine   10 mg Oral TID WC   multivitamin  1 tablet Oral QHS   pantoprazole   40 mg Oral BID AC   psyllium  1 packet Oral Daily   sevelamer  carbonate  2,400 mg Oral TID WC   sodium chloride  flush  3 mL Intravenous Q12H   Continuous Infusions:  anticoagulant sodium citrate      ceFEPime  (MAXIPIME ) IV 2 g (02/24/24 1024)   [START ON 02/26/2024] vancomycin      PRN Meds:.acetaminophen  **OR** acetaminophen , alteplase , anticoagulant sodium citrate , cyclobenzaprine , diphenhydrAMINE , heparin , lidocaine  (PF), lidocaine -prilocaine , melatonin, midodrine , oxyCODONE -acetaminophen , pentafluoroprop-tetrafluoroeth  HPI: Carl Citro Sr. is a 64 y.o. male with past medical history significant for end-stage renal disease on hemodialysis anterior cervical corpectomy C4 C3 C5 fusion peripheral vascular disease status post angioplasty stenting of the right SFA and popliteal arteries who had presented to the hospital on 27 December.  At that point in time he had apparently developed pain in his right knee and leg and was having weakness there as well as right sided shoulder and arm pain pain weakness.  Sustained multiple falls at home and struck  his head but denied loss of consciousness.  In the ER initially was complaining of ear pain and drainage despite a recent cerumen disimpaction and use of eardrops. Suffering from pulsatile tinnitus and ear pain as well.  Blood cultures were taken he was tested for flu and COVID which were negative CT head and hip films were unremarkable he was started on Zosyn  on the 27th.  Ultimately however he was found to have a right septic native arthritis.  He was taken to the operating room on January 9 and underwent right knee  arthroscopic I&D with cultures having been taken which have not yielded any organism.  Of note he was on antibiotics already at this point in time did not receive any anti-MRSA coverage.  In terms of other things that he had investigated he had an MRI of the brain performed on 27 December no acute intracranial abnormalities but some mastoid effusions and a small remote infarct in the left frontal periventricular white matter remote lacunar infarcts in the basal ganglia as well as left occipital lobe MRI cervical spine showed CR C5 ACDF with C4 corpectomy limited motion exam nation with mild to moderate canal stenosis C2-C3 and C3-C4 with some endplate edema C5-C6 thought to be degenerative in nature  The temporal bone showed some left osteo mastoiditis on imaging and opacification left middle ear.  Maria of the thoracic spine showed linear edema in the left eccentric T11-T12 superior endplates thought to be potentially reactive with some severe left foraminal stenosis T11-T12 and moderate right foraminal stenosis T2-3 T8-9 T9-10.  MRI right shoulder showed right shoulder arthroplasty with susceptibility artifact but also well circumcised fluid collection in the subacromial subdeltoid space that was measured 5.1 x 2.9 x 1.9 cm thought but radiology be a postoperative collection that was not infected.  Neurosurgery also saw the patient and CT T spine showed fracture but some moderate dextro scoliosis and moderate kyphosis with erosions superior endplate T11-12 again thought to be degenerative in nature with some diffuse sclerosis of the vertebral bodies  While we do not have a culprit organism I would expect the pathogen responsible was sensitive to the Zosyn  that he was on.  He currently has no other symptoms besides his right knee pain.  Given the fluid collection seen on MRI shoulder would monitor that site clinically.  Onecould make an argument that one could using simply cefepime  with dialysis  for a month postoperatively might be sufficient but I think I would prefer to hedge the bett and use vancomycin  and cefepime  together to complete 4 weeks of postoperative antibiotics.  We will then see him in follow-up in the clinic.   [name] has an appointment on 04/14/2024 at 1045AM with Dr. Fleeta Rothman   at  [facility], which  is located in the [facility] at  [address] [place] in [place].  [address], which is located to the left of the elevators.  Phone: [phone]  Fax: [phone]  [email or web address]  The patient should arrive 30 minutes prior to their appoitment.  I personally spent a total of 81 minutes in the care of the patient today including preparing to see the patient, getting/reviewing separately obtained history, performing a medically appropriate exam/evaluation, counseling and educating, placing orders, referring and communicating with other health care professionals, documenting clinical information in the EHR, communicating results, and coordinating care.   Evaluation of the patient requires complex antimicrobial therapy evaluation, counseling , isolation  needs to reduce disease transmission and risk assessment and mitigation.     Review of Systems: Review of Systems  Constitutional:  Positive for fever, malaise/fatigue and weight loss. Negative for chills.  HENT:  Negative for congestion and sore throat.   Eyes:  Negative for blurred vision and photophobia.  Respiratory:  Negative for cough, shortness of breath and wheezing.   Cardiovascular:  Negative for chest pain, palpitations and leg swelling.  Gastrointestinal:  Negative for abdominal pain, blood in stool, constipation, diarrhea, heartburn, melena, nausea and vomiting.  Genitourinary:  Negative for dysuria, flank pain and hematuria.  Musculoskeletal:  Positive for joint pain. Negative for back pain, falls and myalgias.   Skin:  Negative for itching and rash.  Neurological:  Positive for dizziness, focal weakness and weakness. Negative for loss of consciousness and headaches.  Endo/Heme/Allergies:  Does not bruise/bleed easily.  Psychiatric/Behavioral:  Negative for depression and suicidal ideas. The patient does not have insomnia.     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    Parathyroid  abnormality    ectopic parathyroid  gland   Presence of arteriovenous fistula for hemodialysis, primary    RUE PER PT RLE   Refusal of blood product    NO WHOLE BLOOD PROUCTS   Renal cell carcinoma (HCC)    s/p hand assisted laparoscopic bilateral nephrectomies 11/29/17, + RCC left   Secondary hyperparathyroidism    Seizures (HCC)    one episode in past, due to elevated Potassium 08/02/20- at least 4 years ago   Sleep apnea    doesn't use CPAP anymore since weight loss   Stroke (HCC)    no residual    Social History[1]  Family History  Problem Relation Age of Onset   Diabetes Father    Stroke Father    Hypertension Father    Uterine cancer Mother    Lupus Sister    Stroke Sister    Hypertension Sister    Anuerysm Brother        brain   Colon cancer Neg Hx    Esophageal cancer Neg Hx    Stomach cancer Neg Hx    Pancreatic cancer Neg Hx    Liver disease Neg Hx    Colon polyps Neg Hx    Rectal cancer Neg Hx    Allergies[2]  OBJECTIVE: Blood pressure (!) 144/78, pulse  77, temperature 97.8 F (36.6 C), resp. rate 12, height 6' 1 (1.854 m), weight 85.4 kg, SpO2 100%.  Physical Exam Constitutional:      Appearance: He is well-developed.  HENT:     Head: Normocephalic and atraumatic.  Eyes:     Conjunctiva/sclera: Conjunctivae normal.  Cardiovascular:     Rate and Rhythm: Normal rate and regular rhythm.     Heart sounds: No  murmur heard.    No friction rub. No gallop.  Pulmonary:     Effort: Pulmonary effort is normal. No respiratory distress.     Breath sounds: No wheezing.  Abdominal:     General: There is no distension.     Palpations: Abdomen is soft.  Musculoskeletal:     Cervical back: Normal range of motion and neck supple.     Right knee: Swelling present.     Comments: Bandage over knee  Skin:    General: Skin is warm and dry.     Coloration: Skin is not pale.     Findings: No erythema or rash.  Neurological:     General: No focal deficit present.     Mental Status: He is alert and oriented to person, place, and time.  Psychiatric:        Mood and Affect: Mood normal.        Behavior: Behavior normal.        Thought Content: Thought content normal.        Judgment: Judgment normal.     Lab Results Lab Results  Component Value Date   WBC 9.5 02/24/2024   HGB 8.1 (L) 02/24/2024   HCT 25.3 (L) 02/24/2024   MCV 91.0 02/24/2024   PLT 478 (H) 02/24/2024    Lab Results  Component Value Date   CREATININE 9.23 (H) 02/24/2024   BUN 72 (H) 02/24/2024   NA 134 (L) 02/24/2024   K 4.5 02/24/2024   CL 89 (L) 02/24/2024   CO2 24 02/24/2024    Lab Results  Component Value Date   ALT 6 05/27/2023   AST 15 05/27/2023   ALKPHOS 71 05/27/2023   BILITOT 0.5 05/27/2023     Microbiology: Recent Results (from the past 240 hours)  Body fluid culture w Gram Stain     Status: None   Collection Time: 02/18/24  2:24 PM   Specimen: Synovium; Body Fluid  Result Value Ref Range Status   Specimen Description SYNOVIAL  Final   Special  Requests RIGHT KNEE  Final   Gram Stain   Final    ABUNDANT WBC PRESENT, PREDOMINANTLY PMN NO ORGANISMS SEEN    Culture   Final    NO GROWTH 3 DAYS Performed at [facility], [address], [place], [name]    Report Status 02/22/2024 FINAL  Final    Jomarie Fleeta Rothman, MD [facility] [phone] pager  02/24/2024, 12:29 PM      [1]  Social History Tobacco Use   Smoking status: Former    Current packs/day: 0.00    Types: Cigarettes    Quit date: 06/19/1990    Years since quitting: 33.7   Smokeless tobacco: Never   Tobacco comments:    rarely cigar  Vaping Use   Vaping status: Never Used  Substance Use Topics   Alcohol use: Not Currently    Alcohol/week: 1.0 standard drink of alcohol    Types: 1 Cans of beer per week    Comment: occasional beer   Drug use: Not Currently    Types: Marijuana    Comment: Last use several yrs ago  [2]  Allergies Allergen Reactions   Infed [Iron Dextran] Other (See Comments)    Decreased BP    Vicodin [Hydrocodone -Acetaminophen ] Other (See Comments)    Decreased BP   Hydrocodone  Other (See Comments)    Unable to recall reaction   Voltaren [  Diclofenac] Other (See Comments)    Hypotension    "

## 2024-02-24 NOTE — TOC Progression Note (Signed)
 Transition of Care (TOC) - Progression Note    Patient Details  Name: Carl Joslyn Sr. MRN: [id number] Date of Birth: 1960/09/21  Transition of Care [facility]) CM/SW Contact  Sherline Clack, [place] Phone Number: 02/24/2024, 12:04 PM  Clinical Narrative:     CSW spoke with son regarding SNF choice after patient's knee procedure. Patient's son asked if it was possible for patient to be re-evaluated for CIR, and CSW reached out to PT to ask. PT will be seeing patient later today after his dialysis treatment. CSW will monitor PT recommendations.   Expected Discharge Plan: Skilled Nursing Facility Barriers to Discharge: SNF Pending bed offer               Expected Discharge Plan and Services       Living arrangements for the past 2 months: Single Family Home                                       Social Drivers of Health (SDOH) Interventions SDOH Screenings   Food Insecurity: No Food Insecurity (02/10/2024)  Housing: Low Risk (02/10/2024)  Transportation Needs: No Transportation Needs (02/10/2024)  Utilities: Not At Risk (02/10/2024)  Depression (PHQ2-9): Medium Risk (08/19/2023)  Social Connections: Socially Isolated (02/10/2024)  Tobacco Use: Medium Risk (02/21/2024)    Readmission Risk Interventions    05/30/2023   12:03 PM  Readmission Risk Prevention Plan  Transportation Screening Complete  HRI or Home Care Consult Complete  Social Work Consult for Recovery Care Planning/Counseling Complete  Palliative Care Screening Not Applicable  Medication Review Oceanographer) Complete

## 2024-02-24 NOTE — Progress Notes (Signed)
 Came in awake and oriented. Consent verified and on file. Started with no complaints  Tx duration:3.5 hours UF removal: 1.5L  Access used: Right AVF Access issue: None  Tx completed and tolerated. Pressure dressing applied. Endorsed to floor nurse. Transported back to room.   Gill Delrossi Rubi Kenecia Barren, RN [facility]

## 2024-02-24 NOTE — Progress Notes (Signed)
 Occupational Therapy Treatment Patient Details Name: Carl Esco Sr. MRN: [id number] DOB: 04/21/1960 Today's Date: 02/24/2024   History of present illness Pt is a 64 y.o. male who presented 02/07/24 with progressive R-sided weakness and pain along with L ear pain and drainage. Pt septic with L acute otitis media. MRI brain showed no acute findings. MRI cervical spine demonstrated no interval change from prior surgery and no clear etiology for current weakness. Imaging did note prior left-sided strokes, without evidence of acute stroke or recrudescence. Neurosurgery consulted and recommends no acute intervention. R weakness suspected recrudescence of prior symptoms in setting of severe infection. PMH includes: recurrent GIB, epilepsy, PRES, TIA, HTN, renal cell carcinoma, CVA, OSA, ESRD, DM II, Ischemic colitis, anterior cervical corpectomy C4 and C3-C5 fusion (Dr. [name], 05/25/2023), PAD s/p angioplasty/stenting right SFA and popliteal arteries 12/09/2023 for critical limb ischemia.   OT comments  Patient seated on EOB with staff attempting to stand into [facility] for toilet transfer. Patient able to stand from EOB with mod assist +2 and taken to bathroom with Stedy. Patient able to perform toilet hygiene seated and required max assist for clothing management. Patient was returned to EOB and performed sit to stands into RW from raised bed with mod assist. +2.  Patient will benefit from intensive inpatient follow-up therapy, >3 hours/day.  Acute OT to continue to follow to address established goals to facilitate DC to next venue of care.        If plan is discharge home, recommend the following:  Two people to help with walking and/or transfers;Two people to help with bathing/dressing/bathroom;Assistance with cooking/housework;Help with stairs or ramp for entrance;Assist for transportation   Equipment Recommendations  Other (comment) (defer)    Recommendations for Other Services      Precautions /  Restrictions Precautions Precautions: Fall Recall of Precautions/Restrictions: Impaired Precaution/Restrictions Comments: watch HR, R knee buckles Restrictions Weight Bearing Restrictions Per Provider Order: No       Mobility Bed Mobility Overal bed mobility: Needs Assistance Bed Mobility: Sit to Supine       Sit to supine: Mod assist, Used rails   General bed mobility comments: patient able to get LLE into bed and required mod assist with right    Transfers Overall transfer level: Needs assistance Equipment used: Rolling walker (2 wheels), Ambulation equipment used Transfers: Sit to/from Stand, Bed to chair/wheelchair/BSC Sit to Stand: Mod assist, +2 safety/equipment           General transfer comment: toilet transfer performed from EOB to bathroom with Stedy, 2 stands performed from EOB to RW with mod assist +2 to stand Transfer via Lift Equipment: Stedy   Balance Overall balance assessment: Needs assistance Sitting-balance support: Feet supported Sitting balance-Leahy Scale: Fair Sitting balance - Comments: EOB   Standing balance support: Bilateral upper extremity supported, During functional activity, Reliant on assistive device for balance Standing balance-Leahy Scale: Poor Standing balance comment: reliant on BUE support when standing                           ADL either performed or assessed with clinical judgement   ADL Overall ADL's : Needs assistance/impaired                         Toilet Transfer: Moderate assistance;+2 for physical assistance;BSC/3in1;Regular Toilet Laurent) Toilet Transfer Details (indicate cue type and reason): transfer to regular toilet with 3n1 over commode with Stedy and mod  assists +2 to stand Toileting- Architect and Hygiene: Moderate assistance Toileting - Clothing Manipulation Details (indicate cue type and reason): mod to max assist for clothing management and patient able to perform toilet  hygiene seated            Extremity/Trunk Assessment              Vision       Perception     Praxis     Communication Communication Communication: No apparent difficulties   Cognition Arousal: Alert Behavior During Therapy: WFL for tasks assessed/performed Cognition: No apparent impairments                               Following commands: Intact        Cueing   Cueing Techniques: Verbal cues, Tactile cues, Visual cues  Exercises      Shoulder Instructions       General Comments      Pertinent Vitals/ Pain       Pain Assessment Pain Assessment: 0-10 Pain Score: 10-Worst pain ever Pain Location: R knee Pain Descriptors / Indicators: Aching, Sore, Sharp, Tightness, Grimacing Pain Intervention(s): Limited activity within patient's tolerance, Monitored during session, Repositioned  Home Living                                          Prior Functioning/Environment              Frequency  Min 2X/week        Progress Toward Goals  OT Goals(current goals can now be found in the care plan section)  Progress towards OT goals: Progressing toward goals     Plan      Co-evaluation    PT/OT/SLP Co-Evaluation/Treatment: Yes Reason for Co-Treatment: For patient/therapist safety;To address functional/ADL transfers   OT goals addressed during session: ADL's and self-care      AM-PAC OT 6 Clicks Daily Activity     Outcome Measure   Help from another person eating meals?: A Little Help from another person taking care of personal grooming?: A Little Help from another person toileting, which includes using toliet, bedpan, or urinal?: A Lot Help from another person bathing (including washing, rinsing, drying)?: A Lot Help from another person to put on and taking off regular upper body clothing?: A Little Help from another person to put on and taking off regular lower body clothing?: A Lot 6 Click Score: 15     End of Session Equipment Utilized During Treatment: Gait belt;Rolling walker (2 wheels);Other (comment) Laurent)  OT Visit Diagnosis: Unsteadiness on feet (R26.81);Muscle weakness (generalized) (M62.81);History of falling (Z91.81);Pain Pain - Right/Left: Right Pain - part of body: Knee   Activity Tolerance Patient tolerated treatment well   Patient Left in bed;with call bell/phone within reach;with bed alarm set   Nurse Communication Mobility status        Time: 8545-8478 OT Time Calculation (min): 27 min  Charges: OT General Charges $OT Visit: 1 Visit OT Treatments $Self Care/Home Management : 8-22 mins  Dick Laine, OTA Acute Rehabilitation Services  Office [phone]   Jeb LITTIE Laine 02/24/2024, 3:31 PM

## 2024-02-24 NOTE — Progress Notes (Signed)
" ° °  Inpatient Rehabilitation Admissions Coordinator   Insurance denied AIR level rehab on 1/5. We will not reassess for possible CIR admit as previously denied by insurance.  Heron Leavell, RN, MSN Rehab Admissions Coordinator [phone] 02/24/2024 7:16 PM  "

## 2024-02-24 NOTE — Progress Notes (Signed)
 PHARMACY CONSULT NOTE FOR:  OUTPATIENT  PARENTERAL ANTIBIOTIC THERAPY with HD   Indication: Right knee septic arthritis s/p washout 1/9 Regimen: Vancomycin  1000 mg with HD every MWF + Cefepime  2 gm with HD every MWF  End date: 03/23/24  No formal OPAT will be done as patient will receive antibiotics with dialysis. An informational order will be placed for discharge.       Thank you for allowing pharmacy to be a part of this patient's care.  Damien Quiet, PharmD, BCPS, BCIDP Infectious Diseases Clinical Pharmacist Phone: [phone] 02/24/2024, 9:58 AM

## 2024-02-24 NOTE — Progress Notes (Signed)
 Pharmacy Antibiotic Note  Carl Wohl Sr. is a 64 y.o. male admitted on 02/08/2024 with right septic knee s/p washout 1/9. Cultures showing no growth.   Pharmacy has been consulted for vancomycin  dosing. Patient is ESRD on HD MWF. Currently in HD.   Plan: Stop linezolid /augmentin  Cefepime  2 gm with HD MWF Vancomycin  2000 mg x 1 then 1000 mg every MWF with HD  Monitor dialysis sessions/tolerance ID planning 4 weeks of therapy (Last doses 2/9)  Height: 6' 1 (185.4 cm) Weight: 85.8 kg (189 lb 2.5 oz) IBW/kg (Calculated) : 79.9  Temp (24hrs), Avg:97.8 F (36.6 C), Min:97.5 F (36.4 C), Max:98 F (36.7 C)  Recent Labs  Lab 02/18/24 1823 02/19/24 0256 02/21/24 0505 02/21/24 1237 02/22/24 0534 02/23/24 0557 02/23/24 1053 02/24/24 0506 02/24/24 0757  WBC 9.0  --  7.2  --   --   --  9.9  --  9.5  CREATININE 7.91*   < > 7.62* 4.50* 5.37* 7.43*  --  9.23*  --   LATICACIDVEN 1.2  --   --   --   --   --   --   --   --    < > = values in this interval not displayed.    Estimated Creatinine Clearance: 9 mL/min (A) (by C-G formula based on SCr of 9.23 mg/dL (H)).    Allergies[1]     Thank you for allowing pharmacy to be a part of this patients care.  Damien Quiet, PharmD, BCPS, BCIDP Infectious Diseases Clinical Pharmacist Phone: [phone] 02/24/2024 9:46 AM     [1]  Allergies Allergen Reactions   Infed [Iron Dextran] Other (See Comments)    Decreased BP    Vicodin [Hydrocodone -Acetaminophen ] Other (See Comments)    Decreased BP   Hydrocodone  Other (See Comments)    Unable to recall reaction   Voltaren [Diclofenac] Other (See Comments)    Hypotension

## 2024-02-24 NOTE — Progress Notes (Signed)
 Physical Therapy Treatment Patient Details Name: Carl Kluver Sr. MRN: [id number] DOB: December 05, 1960 Today's Date: 02/24/2024   History of Present Illness Pt is a 64 y.o. male who presented 02/07/24 with progressive R-sided weakness and pain along with L ear pain and drainage. Pt septic with L acute otitis media. MRI brain showed no acute findings. MRI cervical spine demonstrated no interval change from prior surgery and no clear etiology for current weakness. Imaging did note prior left-sided strokes, without evidence of acute stroke or recrudescence. Neurosurgery consulted and recommends no acute intervention. R weakness suspected recrudescence of prior symptoms in setting of severe infection. Underwent R knee I&D on 02/21/24 for septic arthritis.  PMH includes: recurrent GIB, epilepsy, PRES, TIA, HTN, renal cell carcinoma, CVA, OSA, ESRD, DM II, Ischemic colitis, anterior cervical corpectomy C4 and C3-C5 fusion (Dr. [name], 05/25/2023), PAD s/p angioplasty/stenting right SFA and popliteal arteries 12/09/2023 for critical limb ischemia.    PT Comments  Patient progressing this session with sit to stand and able to transition to toilet in bathroom using Stedy.  He is eager to progress and despite continued R knee pain he is pushing himself to stand.  Encouraged in bed therex for progression of R LE strength with less pain. He will continue to benefit from skilled PT in the acute setting.  Continue to feel post-acute inpatient rehab (>3 hours/day) is appropriate prior to d/c home.     If plan is discharge home, recommend the following: Two people to help with walking and/or transfers;Two people to help with bathing/dressing/bathroom;Assistance with cooking/housework;Direct supervision/assist for medications management;Direct supervision/assist for financial management;Assist for transportation;Help with stairs or ramp for entrance   Can travel by private vehicle        Equipment Recommendations  BSC/3in1     Recommendations for Other Services       Precautions / Restrictions Precautions Precautions: Fall Recall of Precautions/Restrictions: Impaired Precaution/Restrictions Comments: watch HR, R knee buckles Restrictions Weight Bearing Restrictions Per Provider Order: No Other Position/Activity Restrictions: no weight bearing restrictions     Mobility  Bed Mobility Overal bed mobility: Needs Assistance Bed Mobility: Sit to Supine       Sit to supine: Mod assist, Used rails   General bed mobility comments: assist for R LE into bed and for scooting up to [facility]    Transfers Overall transfer level: Needs assistance Equipment used: Rolling walker (2 wheels), Ambulation equipment used Transfers: Sit to/from Stand, Bed to chair/wheelchair/BSC Sit to Stand: Mod assist, +2 safety/equipment           General transfer comment: toilet transfer performed from EOB to bathroom with Stedy, 2 stands performed from EOB at elevated height to RW with mod assist +2 to stand    Ambulation/Gait                   Stairs             Wheelchair Mobility     Tilt Bed    Modified Rankin (Stroke Patients Only)       Balance Overall balance assessment: Needs assistance Sitting-balance support: Feet supported Sitting balance-Leahy Scale: Good     Standing balance support: Single extremity supported, Reliant on assistive device for balance Standing balance-Leahy Scale: Poor Standing balance comment: reliant on BUE support and external assist when standing                            Communication Communication Communication: No  apparent difficulties  Cognition Arousal: Alert Behavior During Therapy: WFL for tasks assessed/performed   PT - Cognitive impairments: No apparent impairments                         Following commands: Intact      Cueing Cueing Techniques: Verbal cues, Tactile cues, Visual cues  Exercises General Exercises - Lower  Extremity Quad Sets: AROM, Right, Supine, 15 reps    General Comments General comments (skin integrity, edema, etc.): noted R knee wrapped with ace wrap and still visible edema      Pertinent Vitals/Pain Pain Assessment Pain Assessment: 0-10 Pain Score: 10-Worst pain ever Pain Location: R knee Pain Descriptors / Indicators: Aching, Sore, Sharp, Tightness, Grimacing Pain Intervention(s): Monitored during session, Repositioned, Ice applied    Home Living                          Prior Function            PT Goals (current goals can now be found in the care plan section) Progress towards PT goals: Progressing toward goals (slowly)    Frequency    Min 3X/week      PT Plan      Co-evaluation PT/OT/SLP Co-Evaluation/Treatment: Yes Reason for Co-Treatment: For patient/therapist safety;To address functional/ADL transfers PT goals addressed during session: Balance;Mobility/safety with mobility;Proper use of DME;Strengthening/ROM OT goals addressed during session: ADL's and self-care      AM-PAC PT 6 Clicks Mobility   Outcome Measure  Help needed turning from your back to your side while in a flat bed without using bedrails?: A Little Help needed moving from lying on your back to sitting on the side of a flat bed without using bedrails?: A Lot Help needed moving to and from a bed to a chair (including a wheelchair)?: A Lot Help needed standing up from a chair using your arms (e.g., wheelchair or bedside chair)?: Total Help needed to walk in hospital room?: Total Help needed climbing 3-5 steps with a railing? : Total 6 Click Score: 10    End of Session Equipment Utilized During Treatment: Gait belt Activity Tolerance: Patient limited by pain Patient left: with call bell/phone within reach;in bed;with bed alarm set   PT Visit Diagnosis: Muscle weakness (generalized) (M62.81);Pain;Difficulty in walking, not elsewhere classified (R26.2) Pain - Right/Left:  Right Pain - part of body: Knee     Time: 8549-8471 PT Time Calculation (min) (ACUTE ONLY): 38 min  Charges:    $Therapeutic Exercise: 8-22 mins $Therapeutic Activity: 8-22 mins PT General Charges $$ ACUTE PT VISIT: 1 Visit                     Micheline Portal, PT Acute Rehabilitation Services [phone] 02/24/2024    Montie Portal 02/24/2024, 4:01 PM

## 2024-02-24 NOTE — Progress Notes (Signed)
 PT Cancellation Note  Patient Details Name: Carl Visconti Sr. MRN: [id number] DOB: 11/23/1960   Cancelled Treatment:    Reason Eval/Treat Not Completed: Patient at procedure or test/unavailable; in HD this morning.  Will follow up as pt available and schedule permits.    Montie Portal 02/24/2024, 9:13 AM Micheline Portal, PT Acute Rehabilitation Services Office:[phone] 02/24/2024

## 2024-02-24 NOTE — Progress Notes (Signed)
 OT Cancellation Note  Patient Details Name: Carl Vale Sr. MRN: [id number] DOB: Sep 15, 1960   Cancelled Treatment:    Reason Eval/Treat Not Completed: Patient at procedure or test/ unavailable (Patient off unit in HD. OT to attempt later today as schedule permits)  Jeb LITTIE Laine 02/24/2024, 7:52 AM  Dick Laine, OTA Acute Rehabilitation Services  Office [phone]

## 2024-02-24 NOTE — Progress Notes (Signed)
 " [facility] Progress Note    Assessment/ Plan:   ESRD: on HD MWF. Next HD monday. Pt requests 1st shift.   Volume/chronic hypotension: HD with midodrine  support, UF as tolerated  Anemia of ESRD: cont esa darbe 40mcg sq weekly, declines IV alb/ prbc's   Hyperkalemia: persistent, mild, lowered lokelma  from 10gm tid to 10gm daily, will place on hold for now, can resume if needed  Septic arthritis R knee: SP I&D 1/09 per ortho, refused IV so getting po abx--per primary  Otitis media w/ TM perforation: s/p course of abx, ENT has seen  R sided weakness: main c/o on admission, CVA w/u was negative  Dialysis Orders: MWF NW 4h  B400  87.3kg  2K bath  AVF   Heparin  3000 ESA: Mircera 50 q 2 wks (last 12/15) BMM: Calcitriol  2 mcg PO q HD, cinacalcet  120 mg daily   Subjective:   Patient seen and examined on dialysis. Tolerating treatment, no complaints   Objective:   BP 115/65   Pulse 74   Temp 97.8 F (36.6 C)   Resp 13   Ht 6' 1 (1.854 m)   Wt 85.8 kg   SpO2 100%   BMI 24.96 kg/m   Intake/Output Summary (Last 24 hours) at 02/24/2024 1042 Last data filed at 02/23/2024 1846 Gross per 24 hour  Intake --  Output 5 ml  Net -5 ml   Weight change: 1.2 kg  Physical Exam: Gen: NAD CVS: RRR Resp: unlabored, normal wob Abd: soft Ext: right knee dressing in place, trace right ankle edema, no edema LLE Neuro: awake, alert Dialysis access: RUE AVF  Imaging: No results found.  Labs: BMET Recent Labs  Lab 02/18/24 1823 02/19/24 0256 02/20/24 0332 02/21/24 0505 02/21/24 1237 02/22/24 0534 02/23/24 0557 02/24/24 0506  NA 133* 132* 136 133* 134* 133* 135 134*  K 5.2* 5.8* 4.8 5.5* 4.1 5.7* 4.6 4.5  CL 89* 87* 92* 90* 93* 92* 91* 89*  CO2 27 24 29 24   --  26 26 24   GLUCOSE 112* 99 76 84 76 105* 83 84  BUN 49* 60* 36* 59* 32* 42* 61* 72*  CREATININE 7.91* 8.54* 5.62* 7.62* 4.50* 5.37* 7.43* 9.23*  CALCIUM  9.2 9.3 9.4 8.9  --  8.9 8.6* 8.3*  PHOS 5.2*  6.5* 4.5 5.7*  --  5.4* 6.5* 6.7*   CBC Recent Labs  Lab 02/18/24 1823 02/21/24 0505 02/21/24 1237 02/23/24 1053 02/24/24 0757  WBC 9.0 7.2  --  9.9 9.5  NEUTROABS 7.2  --   --   --   --   HGB 9.9* 10.4* 9.5* 7.9* 8.1*  HCT 30.2* 33.5* 28.0* 24.7* 25.3*  MCV 88.6 93.8  --  90.8 91.0  PLT 501* 517*  --  474* 478*    Medications:     (feeding supplement) PROSource Plus  30 mL Oral BID BM   aspirin  EC  81 mg Oral Daily   atorvastatin   10 mg Oral Daily   bupivacaine (PF)  10 mL Infiltration Once   Chlorhexidine  Gluconate Cloth  6 each Topical Q0600   Chlorhexidine  Gluconate Cloth  6 each Topical Q0600   cinacalcet   120 mg Oral Q breakfast   ciprofloxacin -dexamethasone   4 drop Left EAR BID   clopidogrel   75 mg Oral Daily   darbepoetin (ARANESP ) injection - DIALYSIS  40 mcg Subcutaneous Q Fri-1800   ferrous sulfate   325 mg Oral Q breakfast   folic acid   1 mg Oral Daily  gabapentin   300 mg Oral QHS   insulin  aspart  0-6 Units Subcutaneous TID WC   lidocaine   1 patch Transdermal Q24H   lidocaine   1 patch Transdermal Q24H   midodrine   10 mg Oral TID WC   multivitamin  1 tablet Oral QHS   pantoprazole   40 mg Oral BID AC   psyllium  1 packet Oral Daily   sevelamer  carbonate  2,400 mg Oral TID WC   sodium chloride  flush  3 mL Intravenous Q12H   sodium zirconium cyclosilicate   10 g Oral Daily      Ephriam Stank, MD [facility] 02/24/2024, 10:42 AM   "

## 2024-02-25 ENCOUNTER — Inpatient Hospital Stay (HOSPITAL_COMMUNITY)

## 2024-02-25 ENCOUNTER — Ambulatory Visit: Admitting: Physical Therapy

## 2024-02-25 DIAGNOSIS — E1122 Type 2 diabetes mellitus with diabetic chronic kidney disease: Secondary | ICD-10-CM | POA: Diagnosis not present

## 2024-02-25 DIAGNOSIS — I120 Hypertensive chronic kidney disease with stage 5 chronic kidney disease or end stage renal disease: Secondary | ICD-10-CM | POA: Diagnosis not present

## 2024-02-25 DIAGNOSIS — M00861 Arthritis due to other bacteria, right knee: Secondary | ICD-10-CM | POA: Diagnosis not present

## 2024-02-25 DIAGNOSIS — N186 End stage renal disease: Secondary | ICD-10-CM | POA: Diagnosis not present

## 2024-02-25 DIAGNOSIS — R531 Weakness: Secondary | ICD-10-CM | POA: Diagnosis not present

## 2024-02-25 DIAGNOSIS — I330 Acute and subacute infective endocarditis: Secondary | ICD-10-CM

## 2024-02-25 DIAGNOSIS — L0291 Cutaneous abscess, unspecified: Secondary | ICD-10-CM

## 2024-02-25 DIAGNOSIS — H66012 Acute suppurative otitis media with spontaneous rupture of ear drum, left ear: Secondary | ICD-10-CM | POA: Diagnosis not present

## 2024-02-25 DIAGNOSIS — Z794 Long term (current) use of insulin: Secondary | ICD-10-CM | POA: Diagnosis not present

## 2024-02-25 DIAGNOSIS — M25511 Pain in right shoulder: Secondary | ICD-10-CM

## 2024-02-25 HISTORY — PX: IR US GUIDE BX ASP/DRAIN: IMG2392

## 2024-02-25 LAB — RENAL FUNCTION PANEL
Albumin: 3.1 g/dL — ABNORMAL LOW (ref 3.5–5.0)
Anion gap: 17 — ABNORMAL HIGH (ref 5–15)
BUN: 45 mg/dL — ABNORMAL HIGH (ref 8–23)
CO2: 28 mmol/L (ref 22–32)
Calcium: 8.7 mg/dL — ABNORMAL LOW (ref 8.9–10.3)
Chloride: 91 mmol/L — ABNORMAL LOW (ref 98–111)
Creatinine, Ser: 6.63 mg/dL — ABNORMAL HIGH (ref 0.61–1.24)
GFR, Estimated: 9 mL/min — ABNORMAL LOW
Glucose, Bld: 82 mg/dL (ref 70–99)
Phosphorus: 4.7 mg/dL — ABNORMAL HIGH (ref 2.5–4.6)
Potassium: 4.6 mmol/L (ref 3.5–5.1)
Sodium: 136 mmol/L (ref 135–145)

## 2024-02-25 LAB — SYNOVIAL CELL COUNT + DIFF, W/ CRYSTALS
Crystals, Fluid: UNDETERMINED
Crystals, Fluid: UNDETERMINED
Eosinophils-Synovial: UNDETERMINED % (ref 0–1)
Eosinophils-Synovial: UNDETERMINED % (ref 0–1)
Lymphocytes-Synovial Fld: UNDETERMINED % (ref 0–20)
Lymphocytes-Synovial Fld: UNDETERMINED % (ref 0–20)
Monocyte-Macrophage-Synovial Fluid: UNDETERMINED % (ref 50–90)
Monocyte-Macrophage-Synovial Fluid: UNDETERMINED % (ref 50–90)
Neutrophil, Synovial: UNDETERMINED % (ref 0–25)
Neutrophil, Synovial: UNDETERMINED % (ref 0–25)
WBC, Synovial: 64000 /mm3 — ABNORMAL HIGH (ref 0–200)
WBC, Synovial: 93500 /mm3 — ABNORMAL HIGH (ref 0–200)

## 2024-02-25 LAB — GLUCOSE, CAPILLARY
Glucose-Capillary: 86 mg/dL (ref 70–99)
Glucose-Capillary: 97 mg/dL (ref 70–99)
Glucose-Capillary: 128 mg/dL — ABNORMAL HIGH (ref 70–99)

## 2024-02-25 MED ORDER — LIDOCAINE HCL 1 % IJ SOLN
20.0000 mL | Freq: Once | INTRAMUSCULAR | Status: AC
  Administered 2024-02-25: 10 mL via INTRADERMAL

## 2024-02-25 MED ORDER — KATE FARMS STANDARD 1.4 PO LIQD
325.0000 mL | Freq: Every day | ORAL | Status: DC
  Administered 2024-02-26 – 2024-03-02 (×4): 325 mL via ORAL
  Filled 2024-02-25 (×8): qty 325

## 2024-02-25 NOTE — Plan of Care (Signed)
" °  Problem: Education: Goal: Knowledge of General Education information will improve Description: Including pain rating scale, medication(s)/side effects and non-pharmacologic comfort measures Outcome: Progressing   Problem: Health Behavior/Discharge Planning: Goal: Ability to manage health-related needs will improve Outcome: Progressing   Problem: Clinical Measurements: Goal: Ability to maintain clinical measurements within normal limits will improve Outcome: Progressing Goal: Will remain free from infection Outcome: Progressing Goal: Diagnostic test results will improve Outcome: Progressing Goal: Respiratory complications will improve Outcome: Progressing Goal: Cardiovascular complication will be avoided Outcome: Progressing   Problem: Activity: Goal: Risk for activity intolerance will decrease Outcome: Progressing   Problem: Nutrition: Goal: Adequate nutrition will be maintained Outcome: Progressing   Problem: Coping: Goal: Level of anxiety will decrease Outcome: Progressing   Problem: Elimination: Goal: Will not experience complications related to bowel motility Outcome: Progressing Goal: Will not experience complications related to urinary retention Outcome: Progressing   Problem: Pain Managment: Goal: General experience of comfort will improve and/or be controlled Outcome: Progressing   Problem: Safety: Goal: Ability to remain free from injury will improve Outcome: Progressing   Problem: Skin Integrity: Goal: Risk for impaired skin integrity will decrease Outcome: Progressing   Problem: Education: Goal: Ability to describe self-care measures that may prevent or decrease complications (Diabetes Survival Skills Education) will improve Outcome: Progressing Goal: Individualized Educational Video(s) Outcome: Progressing   Problem: Coping: Goal: Ability to adjust to condition or change in health will improve Outcome: Progressing   Problem: Fluid  Volume: Goal: Ability to maintain a balanced intake and output will improve Outcome: Progressing   Problem: Health Behavior/Discharge Planning: Goal: Ability to identify and utilize available resources and services will improve Outcome: Progressing Goal: Ability to manage health-related needs will improve Outcome: Progressing   Problem: Metabolic: Goal: Ability to maintain appropriate glucose levels will improve Outcome: Progressing   Problem: Nutritional: Goal: Maintenance of adequate nutrition will improve Outcome: Progressing Goal: Progress toward achieving an optimal weight will improve Outcome: Progressing   Problem: Skin Integrity: Goal: Risk for impaired skin integrity will decrease Outcome: Progressing   Problem: Tissue Perfusion: Goal: Adequacy of tissue perfusion will improve Outcome: Progressing   Problem: Education: Goal: Knowledge of the prescribed therapeutic regimen will improve Outcome: Progressing   Problem: Bowel/Gastric: Goal: Gastrointestinal status for postoperative course will improve Outcome: Progressing   Problem: Cardiac: Goal: Ability to maintain an adequate cardiac output Outcome: Progressing Goal: Will show no evidence of cardiac arrhythmias Outcome: Progressing   Problem: Nutritional: Goal: Will attain and maintain optimal nutritional status Outcome: Progressing   Problem: Neurological: Goal: Will regain or maintain usual level of consciousness Outcome: Progressing   Problem: Clinical Measurements: Goal: Ability to maintain clinical measurements within normal limits Outcome: Progressing Goal: Postoperative complications will be avoided or minimized Outcome: Progressing   Problem: Respiratory: Goal: Will regain and/or maintain adequate ventilation Outcome: Progressing Goal: Respiratory status will improve Outcome: Progressing   Problem: Skin Integrity: Goal: Demonstrates signs of wound healing without infection Outcome:  Progressing   Problem: Urinary Elimination: Goal: Will remain free from infection Outcome: Progressing Goal: Ability to achieve and maintain adequate urine output Outcome: Progressing   "

## 2024-02-25 NOTE — Procedures (Signed)
 Interventional Radiology Procedure:   Indications: Right shoulder fluid collection  Procedure: US  guided aspiration of right shoulder fluid collections  Findings: Small of fluid in soft tissues anterior and posterior to right shoulder.  Opaque brown fluid aspirated from posterior right shoulder (sample #1).  Yellow fluid aspirated from anterior shoulder soft tissues (sample #2).   Complications: None     EBL: Minimal  Plan: Fluid samples sent for culture, cell count and crystals.    Korey Arroyo R. Philip, MD  Pager: [phone]

## 2024-02-25 NOTE — PMR Pre-admission (Signed)
 PMR Admission Coordinator Pre-Admission Assessment  Patient: Carl Lembo Sr. is an 64 y.o., male MRN: [id number] DOB: 1960-07-27 Height: 6' 1 (185.4 cm) Weight: 85.2 kg  Insurance Information HMO: yes    PPO:      PCP:      IPA:      80/20:      OTHER:  PRIMARY: [employer] Dual complete      Policy#: [id number]; Medicare [id number]      Subscriber: pt CM Name: Neville      Phone#: [phone] option 3  Fax#: [phone] Pre-Cert#: [id number] Auth for CIR from [facility] with [facility] Medicare won approval on expedited appeal for admit 02/26/24 with next review date 03/03/24.  Updates due to 03/03/24 at fax listed above.          Benefits:  Phone #: [phone]     Name: 1/5 Eff. Date: 02/13/24     Deduct: $283      Out of Pocket Max: $9250      Life Max: none CIR: $1765 co pay per admission      SNF: no co pay per day days 1 until 20; $217 co pay per day days 21 until 100 Outpatient: 80%     Co-Pay: 20% Home Health: 100%      Co-Pay: visits per medical neccesity DME: 80%     Co-Pay: 20% Providers: in network  SECONDARY: Medicaid [place]      Policy#: [id number]     Phone#: online passport one source 1/13 active MAAQN  Financial Counselor:       Phone#:   The Data Collection Information Summary for patients in Inpatient Rehabilitation Facilities with attached Privacy Act Statement-Health Care Records was provided and verbally reviewed with: Patient and Family  Emergency Contact Information Contact Information     Name Relation Home Work Mobile   [place] Son   [phone]   Taiki, Buckwalter   [phone]   [name] Daughter   [phone]      Other Contacts     Name Relation Home Work Mobile   [place] Sister [phone]        Current Medical History  Patient Admitting Diagnosis: Debility  History of Present Illness: 64 yo male with history  incomplete SCI requiring  ACDF Dr [name] on 05/25/23, PAD s/p angioplasty/stenting right SFA and  popliteal arteries 12/09/23 for critical limb ischemia, ESRD on hemodialysis presented on 02/08/24 with progressive right sided weakness and pain. Also progressive right arm and shoulder weakness, unable to lift the arm overhead. Multiple falls at home. Had progressed over last several months to walking with RW/cane and driving.   Admitted and began antibiotics, neurosurgery, neurology, orthopedic, ENT, Nephrology and infectious disease consulted. Diagnosed with otitis media and ruptured ear drum. Seen by ENT, underwent CT of temporal bone without evidence of coalescent mastoiditis, bone lesion or abscess. Completed 10 days of antibiotics for otitis media. Will follow up as OP for aural toilet with microscope and updated hearing test.   Initially right sided weakness felt to be recrudescence of old SCI symptoms in the setting of infection. Spinal MRI showed no new cord injury.   He underwent arthrocentesis of right knee  with I and D on 02/18/24 , synovial fluid consistent with septic arthritis. ID consulted and recommended IV antibiotics with Hemodialysis. End date 04/10/24 updated recommendation from ID.   Spiked fever on 1/12. Ortho consulted and underwent arthroscopic I and D with placement of antibiotic beads and drain in right shoulder on 02/28/24. Ortho  would recommend finish out IV and po antibiotics per ID and re aspirate the shoulder in approximately 8 weeks.   Continue Lipitor and Plavix  for history of PVD with angioplasty. Nephrology with ongoing hemodialysis MWF.Continue midodrine  for hypotension. 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 No plan for TEE as would not change management as per ID. SSI for DM and gabapentin  for history of neuropathy.   Monitor HGB, but he would not accept blood transfusions due to his religious beliefs. Heparin  for DVT prophylaxis.   Patient's medical record from [facility] has been reviewed by the rehabilitation admission  coordinator and physician.  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    Parathyroid  abnormality    ectopic parathyroid  gland   Presence of arteriovenous fistula for hemodialysis, primary    RUE PER PT RLE   Refusal of blood product    NO WHOLE BLOOD PROUCTS   Renal cell carcinoma (HCC)    s/p hand assisted laparoscopic bilateral nephrectomies 11/29/17, + RCC left   Secondary hyperparathyroidism    Seizures (HCC)    one episode in past, due to elevated Potassium 08/02/20- at least 4 years ago   Sleep apnea    doesn't use CPAP anymore since weight loss   Stroke [facility])    no residual   Has the patient had major surgery during 100 days prior to admission? Yes  Family History   family history includes Anuerysm in his brother; Diabetes in his father; Hypertension in his father and sister; Lupus in his sister; Stroke in his father and sister; Uterine cancer in his mother.  Current Medications Current Medications[1]  Patients Current Diet:  Diet Order             Diet renal with fluid restriction Fluid restriction: 1200 mL Fluid; Room service appropriate? Yes; Fluid consistency: Thin  Diet effective now                  Precautions / Restrictions Precautions Precautions: Fall Precaution/Restrictions Comments: watch HR, R knee buckles Restrictions Weight Bearing Restrictions Per Provider Order: Yes RUE Weight Bearing Per  Provider Order: Weight bearing as tolerated RLE Weight Bearing Per Provider Order: Weight bearing as tolerated Other Position/Activity Restrictions: Per Dr. [name] on 1/19 pt can perform ROM and WB through RUE as tolerated   Has the patient had 2 or more falls or a fall with injury in the past year? Yes  Prior Activity Level Limited Community (1-2x/wk): Mod I with RW  Prior Functional Level Self Care: Did the patient need help bathing, dressing, using the toilet or eating? Independent  Indoor Mobility: Did the patient need assistance with walking from room to room (with or without device)? Independent  Stairs: Did  the patient need assistance with internal or external stairs (with or without device)? Independent  Functional Cognition: Did the patient need help planning regular tasks such as shopping or remembering to take medications? Independent  Patient Information Are you of Hispanic, Latino/a,or Spanish origin?: A. No, not of Hispanic, Latino/a, or Spanish origin What is your race?: B. Black or African American Do you need or want an interpreter to communicate with a doctor or health care staff?: 0. No  Patient's Response To:  Health Literacy and Transportation Is the patient able to respond to health literacy and transportation needs?: Yes Health Literacy - How often do you need to have someone help you when you read instructions, pamphlets, or other written material from your doctor or pharmacy?: Never In the past 12 months, has lack of transportation kept you from medical appointments or from getting medications?: No In the past 12 months, has lack of transportation kept you from meetings, work, or from getting things needed for daily living?: No  Home Assistive Devices / Equipment Home Equipment: Agricultural Consultant (2 wheels), Grab bars - tub/shower, Government social research officer, Wheelchair - manual  Prior Device Use: Indicate devices/aids used by the patient prior to current illness, exacerbation  or injury? Walker and cane  Current Functional Level Cognition  Arousal/Alertness: Awake/alert Overall Cognitive Status: History of cognitive impairments - at baseline Orientation Level: Oriented X4 Attention: Sustained Sustained Attention: Impaired Sustained Attention Impairment: Verbal basic Memory: Impaired Memory Impairment: Decreased short term memory, Decreased recall of new information Decreased Short Term Memory: Verbal basic Awareness: Appears intact Problem Solving: Appears intact Safety/Judgment: Appears intact    Extremity Assessment (includes Sensation/Coordination)  Upper Extremity Assessment: Defer to OT evaluation RUE Deficits / Details: R shoulder pain with ROM, decreased grasp noted, pt reports issues with R shoulder in the past but now much worse RUE: Shoulder pain with ROM RUE Coordination: decreased gross motor, decreased fine motor  Lower Extremity Assessment: Defer to PT evaluation RLE Deficits / Details: Performs dorsiflexion/plantarflexion within minimal range. Unable to demonstrate quad contraction at this time. Reports numbness in thigh area. Swelling noted. 7/10 pain. NWB at this time.    ADLs  Overall ADL's : Needs assistance/impaired Eating/Feeding: Set up, Bed level Eating/Feeding Details (indicate cue type and reason): d/t impaired FMC in dominant RUE Grooming: Wash/dry hands, Wash/dry face, Oral care, Set up, Supervision/safety, Sitting Grooming Details (indicate cue type and reason): on EOB Upper Body Bathing: Sitting, Minimal assistance Lower Body Bathing: Maximal assistance, Sit to/from stand, +2 for safety/equipment, +2 for physical assistance Upper Body Dressing : Minimal assistance, Sitting Lower Body Dressing: Maximal assistance, +2 for physical assistance, +2 for safety/equipment Toilet Transfer: Moderate assistance, +2 for physical assistance, BSC/3in1, Regular Toilet [employer]) Toilet Transfer Details (indicate cue type and reason):  transfer to regular toilet with 3n1 over commode with Stedy and mod assists +2 to stand Toileting- Clothing Manipulation and Hygiene: Moderate assistance Toileting - Clothing Manipulation Details (indicate cue type and reason): mod to max assist for clothing management and patient able to perform toilet hygiene seated Functional mobility during ADLs: +2 for safety/equipment, +2 for physical assistance, Moderate assistance, Maximal assistance (sit>stands only with RW) General ADL Comments: ADL participation impacted by pain    Mobility  Overal bed mobility: Needs Assistance Bed Mobility: Supine to Sit, Sit to Supine Supine to sit: Min assist Sit to supine: Min assist General bed mobility comments: assistance with RLE    Transfers  Overall transfer level: Needs assistance Equipment used: Rolling walker (2  wheels), None Transfers: Sit to/from Stand Sit to Stand: Mod assist, Max assist Bed to/from chair/wheelchair/BSC transfer type:: Via Lift equipment Squat pivot transfers: Max assist, +2 physical assistance, +2 safety/equipment, From elevated surface Step pivot transfers: Mod assist, +2 physical assistance, +2 safety/equipment, From elevated surface  Lateral/Scoot Transfers: Max assist Transfer via Lift Equipment: Stedy General transfer comment: attempted 3x from EOB, Max A with RW with difficutly getting the RUE up onto the walker. Mod A with face to face sit to stand with pt holding onto therapist arm with R hand and pushing up from EOB with L hand for 2 attempts. Pt was unable to get fully to standing for any trial.    Ambulation / Gait / Stairs / Wheelchair Mobility  Ambulation/Gait Ambulation/Gait assistance: Mod assist, +2 physical assistance Gait Distance (Feet): 0 Feet Assistive device: Rolling walker (2 wheels) Gait Pattern/deviations: Step-to pattern, Decreased step length - right, Decreased step length - left, Decreased stride length, Knees buckling, Trunk flexed General Gait  Details: unable at this time. Gait velocity: decreased Gait velocity interpretation: <1.31 ft/sec, indicative of household ambulator Pre-gait activities: Pt took small steps from EOB to recliner with R knee blocked throughout; slight improved with incerased time up but continued to require knee block Mod A +2 for wgt shifting, stepping and balance/LE strength.    Posture / Balance Dynamic Sitting Balance Sitting balance - Comments: EOB Balance Overall balance assessment: Needs assistance Sitting-balance support: Feet supported Sitting balance-Leahy Scale: Good Sitting balance - Comments: EOB Standing balance support: Bilateral upper extremity supported Standing balance-Leahy Scale: Poor Standing balance comment: UE support and assist for balance in standing    Special considerations/life events  OP hemodialysis MWF [facility] center Fall precautions Son would transport to OP hemodialysis for patient was driving self prior to admit   Previous Home Environment  Living Arrangements: Children  Lives With: Son Available Help at Discharge: Family, Available 24 hours/day Type of Home: Apartment Home Layout: One level Home Access: Level entry Bathroom Shower/Tub: Engineer, Manufacturing Systems: Standard (+ toilet riser) Bathroom Accessibility: Yes How Accessible: Accessible via walker Home Care Services: No  Discharge Living Setting Plans for Discharge Living Setting: Patient's home, Apartment, Lives with (comment) (son) Type of Home at Discharge: Apartment Discharge Home Layout: One level Discharge Home Access: Level entry Discharge Bathroom Shower/Tub: Tub/shower unit Discharge Bathroom Toilet: Standard Discharge Bathroom Accessibility: Yes How Accessible: Accessible via walker Does the patient have any problems obtaining your medications?: No Different apartment since last admit to CIR. At that time he was on third level, but now first level with level entry.  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 Went to SNF afterwards and stayed only 1 to 2 weeks before returning home Pt/Family Agrees to Admission and willing to participate: Yes Program Orientation Provided & Reviewed with Pt/Caregiver Including Roles  & Responsibilities: Yes  Decrease burden of Care through IP rehab admission:  n/a  Possible need for SNF placement upon discharge: not anticipated. Last CIR admit he discharged to SNF for less than 2 weeks.Plan is direct discharge home.  Patient Condition: This patient's medical and functional status has changed since the consult dated: 02/10/25 in which the Rehabilitation Physician determined and documented that the patient's condition is appropriate for intensive rehabilitative care in an inpatient rehabilitation facility. See History of Present Illness (above) for medical update. Functional changes are: mod to max assist overall. Patient's medical and functional status update has been discussed with the Rehabilitation physician and patient remains appropriate for inpatient rehabilitation. Will admit to inpatient rehab today.  Preadmission Screen Completed By:  Alison Heron Lot, RN MSN 03/03/2024 11:37 AM ______________________________________________________________________    Discussed status with Dr. Babs on 03/03/24 at 1059 and received approval for admission today.  Admission Coordinator:  Alison Heron Lot, RN MSN time 1059 Date 03/03/24   Assessment/Plan: Diagnosis: debility Does the need for close, 24 hr/day Medical supervision in concert with the patient's rehab needs make it unreasonable for this patient to be served in a less intensive setting? Yes Co-Morbidities requiring supervision/potential complications: septic jts, pain, esrd on hd Due to bladder management, bowel management, safety, skin/wound care, disease management, medication administration, pain management, and patient education, does the patient require 24 hr/day rehab nursing? Yes Does the patient require coordinated care of a physician, rehab nurse, PT, OT, and SLP to address physical and functional deficits in the context of the above medical diagnosis(es)? Yes Addressing deficits in the following areas: balance, endurance, locomotion, strength, transferring, bowel/bladder control, bathing, dressing, feeding, grooming, toileting, and psychosocial support Can the patient actively participate in an intensive therapy program of at least 3 hrs of therapy 5 days a week? Yes The potential for patient to make measurable gains while on inpatient rehab is excellent Anticipated functional outcomes upon discharge from inpatient rehab: min assist PT, min assist OT, n/a SLP Estimated rehab length of stay to reach the above functional goals is: 2-3 weeks Anticipated discharge destination: Home 10. Overall Rehab/Functional Prognosis: excellent   MD Signature: Arthea IVAR Babs, MD, [facility] Physical Medicine & Rehabilitation Medical Director Rehabilitation Services 03/03/2024      [1]  Current Facility-Administered Medications:    (feeding supplement) PROSource Plus liquid 30 mL, 30 mL, Oral, BID BM, Stovall, Kathryn R, PA-C, 30 mL at 03/03/24 9147   acetaminophen  (TYLENOL ) tablet  650 mg, 650 mg, Oral, Q6H PRN, 650 mg at 03/02/24 0543 **OR** acetaminophen  (TYLENOL ) suppository 650 mg, 650 mg, Rectal, Q6H PRN, Bryn Bernardino NOVAK, MD   aspirin  EC tablet 81 mg, 81 mg, Oral, Daily, Bryn Bernardino NOVAK, MD, 81 mg at 03/03/24 9148   atorvastatin  (LIPITOR) tablet 10 mg, 10 mg, Oral, Daily, Bryn Bernardino NOVAK, MD, 10 mg at 03/03/24 9148   bupivacaine (PF) (MARCAINE ) 0.5 % injection 10 mL, 10 mL, Infiltration, Once, Juliane Ozell PARAS, PA-C   ceFEPIme  (MAXIPIME ) 2 g in sodium chloride  0.9 % 100 mL IVPB, 2 g, Intravenous, Q M,W,F-HD, Reome, Earle J, RPH, Stopped at 03/02/24 1126   Chlorhexidine  Gluconate Cloth 2 % PADS 6 each, 6 each, Topical, Q0600, Geralynn Charleston, MD, 6 each at 03/03/24 0751   Chlorhexidine  Gluconate Cloth 2 % PADS 6 each, 6 each, Topical, Q0600, Geralynn Charleston, MD, 6 each at 03/03/24 0751   cinacalcet  (SENSIPAR ) tablet 120 mg, 120 mg, Oral, Q breakfast, Stovall, Kathryn R, PA-C, 120 mg at 03/03/24 0749   clopidogrel  (PLAVIX ) tablet 75 mg, 75 mg, Oral,  Daily, Magnant, Charles L, PA-C, 75 mg at 03/03/24 9148   cyclobenzaprine  (FLEXERIL ) tablet 10 mg, 10 mg, Oral, TID PRN, Christobal Guadalajara, MD, 10 mg at 03/02/24 2117   Darbepoetin Alfa  (ARANESP ) injection 100 mcg, 100 mcg, Subcutaneous, Q Mon-1800, Gretel Prentice BIRCH, RPH, 100 mcg at 03/02/24 1750   diphenhydrAMINE  (BENADRYL ) capsule 25 mg, 25 mg, Oral, Q6H PRN, Kc, Ramesh, MD   docusate sodium  (COLACE) capsule 100 mg, 100 mg, Oral, BID, Magnant, Charles L, PA-C, 100 mg at 03/03/24 9147   feeding supplement (KATE FARMS STANDARD 1.4) liquid 325 mL, 325 mL, Oral, Daily, Regalado, Belkys A, MD, 325 mL at 03/02/24 1328   ferrous sulfate  tablet 325 mg, 325 mg, Oral, Q breakfast, Regalado, Belkys A, MD, 325 mg at 03/03/24 0749   folic acid  (FOLVITE ) tablet 1 mg, 1 mg, Oral, Daily, Regalado, Belkys A, MD, 1 mg at 03/03/24 9148   gabapentin  (NEURONTIN ) capsule 300 mg, 300 mg, Oral, QHS, Kc, Ramesh, MD, 300 mg at 03/02/24 2117   heparin  sodium  (porcine) injection 1,000 Units, 1,000 Units, Intravenous, Once PRN, Dennise Hoes, MD   insulin  aspart (novoLOG ) injection 0-6 Units, 0-6 Units, Subcutaneous, TID WC, Rashid, Farhan, MD   lidocaine  (LIDODERM ) 5 % 1 patch, 1 patch, Transdermal, Q24H, Kc, Ramesh, MD, 1 patch at 02/27/24 1313   lidocaine  (LIDODERM ) 5 % 1 patch, 1 patch, Transdermal, Q24H, Rashid, Farhan, MD, 1 patch at 03/02/24 1735   melatonin tablet 5 mg, 5 mg, Oral, QHS PRN, Christobal Guadalajara, MD, 5 mg at 02/09/24 2125   menthol  (CEPACOL) lozenge 3 mg, 1 lozenge, Oral, PRN **OR** phenol (CHLORASEPTIC) mouth spray 1 spray, 1 spray, Mouth/Throat, PRN, Magnant, Charles L, PA-C   metoCLOPramide  (REGLAN ) tablet 5-10 mg, 5-10 mg, Oral, Q8H PRN **OR** metoCLOPramide  (REGLAN ) injection 5-10 mg, 5-10 mg, Intravenous, Q8H PRN, Magnant, Charles L, PA-C   midodrine  (PROAMATINE ) tablet 10 mg, 10 mg, Oral, PRN, Stovall, Kathryn R, PA-C, 10 mg at 02/29/24 9170   midodrine  (PROAMATINE ) tablet 10 mg, 10 mg, Oral, TID WC, Kc, Ramesh, MD, 10 mg at 03/03/24 0749   multivitamin (RENA-VIT) tablet 1 tablet, 1 tablet, Oral, QHS, Stovall, Kathryn R, PA-C, 1 tablet at 03/02/24 2117   ondansetron  (ZOFRAN ) tablet 4 mg, 4 mg, Oral, Q6H PRN, Magnant, Charles L, PA-C   oxyCODONE -acetaminophen  (PERCOCET/ROXICET) 5-325 MG per tablet 1 tablet, 1 tablet, Oral, Q4H PRN, Danford, Lonni SQUIBB, MD, 1 tablet at 03/03/24 1041   pantoprazole  (PROTONIX ) EC tablet 40 mg, 40 mg, Oral, BID AC, Kc, Ramesh, MD, 40 mg at 03/03/24 0749   psyllium (HYDROCIL/METAMUCIL) 1 packet, 1 packet, Oral, Daily, Guillermina Hamilton, MD, 1 packet at 03/03/24 9147   sevelamer  carbonate (RENVELA ) tablet 2,400 mg, 2,400 mg, Oral, TID WC, Jerrye Katheryn BROCKS, MD, 2,400 mg at 03/03/24 0749   sodium chloride  flush (NS) 0.9 % injection 3 mL, 3 mL, Intravenous, Q12H, Bryn Motto B, MD, 3 mL at 03/03/24 0856   vancomycin  (VANCOCIN ) IVPB 1000 mg/200 mL premix, 1,000 mg, Intravenous, Q M,W,F-HD, Reome, Earle J, RPH, Last  Rate: 200 mL/hr at 03/02/24 1026, 1,000 mg at 03/02/24 1026

## 2024-02-25 NOTE — Progress Notes (Signed)
" ° ° ° ° °  Cell count from fluid collections   64K and 93.5 K in two different samples  This is HIGHLY c/w with infection   Given that the patient has been on antibiotics for 6 days and that he did not grow anything from his native septic right knee which had 74k WBC on cultures from aspirate or OR liklihood of cultures being helpful is low  I think he needs help from Orthopedic surgery to clean out the right shoulder where these apparent abscesses are residing  [facility] 02/25/2024, 9:14 PM  "

## 2024-02-25 NOTE — Progress Notes (Signed)
 " [facility] Progress Note    Assessment/ Plan:   ESRD: on HD MWF. Pt requests 1st shift. Hd tomorrow  Volume/chronic hypotension: HD with midodrine  support, UF as tolerated  Anemia of ESRD: cont esa darbe 40mcg sq weekly, declines IV alb/ prbc's   Hyperkalemia: persistent, mild, had been on lokelma --on hold for now, can resume if needed  Septic arthritis R knee: SP I&D 1/09 per ortho, refused IV so getting po abx--per primary  Otitis media w/ TM perforation: s/p course of abx, ENT has seen  R sided weakness: main c/o on admission, CVA w/u was negative  Dialysis Orders: MWF NW 4h  B400  87.3kg  2K bath  AVF   Heparin  3000 ESA: Mircera 50 q 2 wks (last 12/15) BMM: Calcitriol  2 mcg PO q HD, cinacalcet  120 mg daily   Subjective:   Patient seen and examined bedside. No complaints. Tolerated HD yesterday with net uf 1.5L   Objective:   BP 98/61 (BP Location: Left Arm)   Pulse 77   Temp 99.1 F (37.3 C)   Resp 16   Ht 6' 1 (1.854 m)   Wt 85.4 kg   SpO2 97%   BMI 24.84 kg/m   Intake/Output Summary (Last 24 hours) at 02/25/2024 9161 Last data filed at 02/25/2024 0406 Gross per 24 hour  Intake 100 ml  Output 1500 ml  Net -1400 ml   Weight change: -0.1 kg  Physical Exam: Gen: NAD CVS: RRR Resp: unlabored, normal wob Abd: soft Ext: right knee dressing in place, trace right ankle edema, no edema LLE Neuro: awake, alert Dialysis access: RUE AVF  Imaging: No results found.  Labs: BMET Recent Labs  Lab 02/19/24 0256 02/20/24 0332 02/21/24 0505 02/21/24 1237 02/22/24 0534 02/23/24 0557 02/24/24 0506 02/25/24 0531  NA 132* 136 133* 134* 133* 135 134* 136  K 5.8* 4.8 5.5* 4.1 5.7* 4.6 4.5 4.6  CL 87* 92* 90* 93* 92* 91* 89* 91*  CO2 24 29 24   --  26 26 24 28   GLUCOSE 99 76 84 76 105* 83 84 82  BUN 60* 36* 59* 32* 42* 61* 72* 45*  CREATININE 8.54* 5.62* 7.62* 4.50* 5.37* 7.43* 9.23* 6.63*  CALCIUM  9.3 9.4 8.9  --  8.9 8.6* 8.3* 8.7*  PHOS  6.5* 4.5 5.7*  --  5.4* 6.5* 6.7* 4.7*   CBC Recent Labs  Lab 02/18/24 1823 02/21/24 0505 02/21/24 1237 02/23/24 1053 02/24/24 0757  WBC 9.0 7.2  --  9.9 9.5  NEUTROABS 7.2  --   --   --   --   HGB 9.9* 10.4* 9.5* 7.9* 8.1*  HCT 30.2* 33.5* 28.0* 24.7* 25.3*  MCV 88.6 93.8  --  90.8 91.0  PLT 501* 517*  --  474* 478*    Medications:     (feeding supplement) PROSource Plus  30 mL Oral BID BM   aspirin  EC  81 mg Oral Daily   atorvastatin   10 mg Oral Daily   bupivacaine (PF)  10 mL Infiltration Once   Chlorhexidine  Gluconate Cloth  6 each Topical Q0600   Chlorhexidine  Gluconate Cloth  6 each Topical Q0600   cinacalcet   120 mg Oral Q breakfast   clopidogrel   75 mg Oral Daily   darbepoetin (ARANESP ) injection - DIALYSIS  40 mcg Subcutaneous Q Fri-1800   ferrous sulfate   325 mg Oral Q breakfast   folic acid   1 mg Oral Daily   gabapentin   300 mg Oral QHS  insulin  aspart  0-6 Units Subcutaneous TID WC   lidocaine   1 patch Transdermal Q24H   lidocaine   1 patch Transdermal Q24H   midodrine   10 mg Oral TID WC   multivitamin  1 tablet Oral QHS   pantoprazole   40 mg Oral BID AC   psyllium  1 packet Oral Daily   sevelamer  carbonate  2,400 mg Oral TID WC   sodium chloride  flush  3 mL Intravenous Q12H      Ephriam Stank, MD [facility] 02/25/2024, 8:38 AM   "

## 2024-02-25 NOTE — Progress Notes (Addendum)
 "       Subjective: Patient was febrile again overnight   Antibiotics:  Anti-infectives (From admission, onward)    Start     Dose/Rate Route Frequency Ordered Stop   02/26/24 1200  vancomycin  (VANCOCIN ) IVPB 1000 mg/200 mL premix        1,000 mg 200 mL/hr over 60 Minutes Intravenous Every M-W-F (Hemodialysis) 02/24/24 0951     02/24/24 1500  vancomycin  (VANCOREADY) IVPB 2000 mg/400 mL        2,000 mg 200 mL/hr over 120 Minutes Intravenous  Once 02/24/24 0945 02/24/24 1854   02/24/24 1200  ceFEPIme  (MAXIPIME ) 2 g in sodium chloride  0.9 % 100 mL IVPB        2 g 200 mL/hr over 30 Minutes Intravenous Every M-W-F (Hemodialysis) 02/24/24 0945     02/24/24 0000  ceFEPime  (MAXIPIME ) IVPB        2 g Intravenous Every M-W-F (Hemodialysis) 02/24/24 1121 03/23/24 2359   02/24/24 0000  vancomycin  IVPB        1,000 mg Intravenous Every M-W-F (Hemodialysis) 02/24/24 1121 03/23/24 2359   02/22/24 1000  amoxicillin -clavulanate (AUGMENTIN ) 500-125 MG per tablet 1 tablet  Status:  Discontinued        1 tablet Oral 2 times daily 02/22/24 0935 02/24/24 0945   02/22/24 0600  ceFAZolin  (ANCEF ) IVPB 2g/100 mL premix  Status:  Discontinued        2 g 200 mL/hr over 30 Minutes Intravenous On call to O.R. 02/21/24 1200 02/21/24 1605   02/21/24 2200  amoxicillin -clavulanate (AUGMENTIN ) 500-125 MG per tablet 1 tablet  Status:  Discontinued        1 tablet Oral Daily at bedtime 02/20/24 1042 02/22/24 0935   02/20/24 1130  linezolid  (ZYVOX ) tablet 600 mg  Status:  Discontinued        600 mg Oral Every 12 hours 02/20/24 1037 02/24/24 0945   02/20/24 1130  amoxicillin -clavulanate (AUGMENTIN ) 875-125 MG per tablet 1 tablet  Status:  Discontinued        1 tablet Oral Every 12 hours 02/20/24 1038 02/20/24 1041   02/20/24 1130  amoxicillin -clavulanate (AUGMENTIN ) 500-125 MG per tablet 1 tablet        1 tablet Oral  Once 02/20/24 1042 02/20/24 1329   02/19/24 1545  amoxicillin -clavulanate (AUGMENTIN ) 500-125 MG  per tablet 1 tablet  Status:  Discontinued        1 tablet Oral  Once 02/19/24 1527 02/20/24 1038   02/17/24 0615  amoxicillin -clavulanate (AUGMENTIN ) 500-125 MG per tablet 1 tablet        1 tablet Oral Every 12 hours 02/17/24 0556 02/18/24 2151   02/09/24 0600  piperacillin -tazobactam (ZOSYN ) IVPB 2.25 g  Status:  Discontinued        2.25 g 100 mL/hr over 30 Minutes Intravenous Every 8 hours 02/09/24 0135 02/17/24 0556   02/08/24 2200  amoxicillin -clavulanate (AUGMENTIN ) 875-125 MG per tablet 1 tablet  Status:  Discontinued        1 tablet Oral Every 12 hours 02/08/24 2104 02/09/24 0135   02/08/24 2200  piperacillin -tazobactam (ZOSYN ) IVPB 3.375 g        3.375 g 100 mL/hr over 30 Minutes Intravenous  Once 02/08/24 2153 02/08/24 2314   02/08/24 0000  amoxicillin -clavulanate (AUGMENTIN ) 875-125 MG tablet  Status:  Discontinued        1 tablet Oral Every 12 hours 02/08/24 1908 02/24/24        Medications: Scheduled Meds:  (feeding supplement) PROSource Plus  30  mL Oral BID BM   aspirin  EC  81 mg Oral Daily   atorvastatin   10 mg Oral Daily   bupivacaine (PF)  10 mL Infiltration Once   Chlorhexidine  Gluconate Cloth  6 each Topical Q0600   Chlorhexidine  Gluconate Cloth  6 each Topical Q0600   cinacalcet   120 mg Oral Q breakfast   clopidogrel   75 mg Oral Daily   darbepoetin (ARANESP ) injection - DIALYSIS  40 mcg Subcutaneous Q Fri-1800   feeding supplement ([employer] STANDARD 1.4) Liquid  325 mL Oral Daily   ferrous sulfate   325 mg Oral Q breakfast   folic acid   1 mg Oral Daily   gabapentin   300 mg Oral QHS   insulin  aspart  0-6 Units Subcutaneous TID WC   lidocaine   1 patch Transdermal Q24H   lidocaine   1 patch Transdermal Q24H   midodrine   10 mg Oral TID WC   multivitamin  1 tablet Oral QHS   pantoprazole   40 mg Oral BID AC   psyllium  1 packet Oral Daily   sevelamer  carbonate  2,400 mg Oral TID WC   sodium chloride  flush  3 mL Intravenous Q12H   Continuous Infusions:   ceFEPime  (MAXIPIME ) IV Stopped (02/24/24 1852)   [START ON 02/26/2024] vancomycin      PRN Meds:.acetaminophen  **OR** acetaminophen , cyclobenzaprine , diphenhydrAMINE , melatonin, midodrine , oxyCODONE -acetaminophen     Objective: Weight change: -0.1 kg  Intake/Output Summary (Last 24 hours) at 02/25/2024 1626 Last data filed at 02/25/2024 0406 Gross per 24 hour  Intake 100 ml  Output --  Net 100 ml   Blood pressure 116/68, pulse 69, temperature (!) 97.5 F (36.4 C), resp. rate 16, height 6' 1 (1.854 m), weight 85.4 kg, SpO2 100%. Temp:  [97.5 F (36.4 C)-101.2 F (38.4 C)] 97.5 F (36.4 C) (01/13 1436) Pulse Rate:  [69-93] 69 (01/13 1436) Resp:  [16-19] 16 (01/13 1436) BP: (86-116)/(51-68) 116/68 (01/13 1436) SpO2:  [97 %-100 %] 100 % (01/13 1436)  Physical Exam: Physical Exam Constitutional:      Appearance: Normal appearance.  HENT:     Head: Normocephalic and atraumatic.  Eyes:     General:        Right eye: No discharge.        Left eye: No discharge.     Extraocular Movements: Extraocular movements intact.     Pupils: Pupils are equal, round, and reactive to light.  Cardiovascular:     Rate and Rhythm: Normal rate and regular rhythm.  Pulmonary:     Effort: No respiratory distress.     Breath sounds: No wheezing.  Abdominal:     General: There is no distension.  Musculoskeletal:     Right shoulder: Tenderness present. Decreased range of motion.     Cervical back: Normal range of motion and neck supple.     Right knee: Swelling present.     Comments: Dressing in place  Skin:    General: Skin is warm and dry.  Neurological:     General: No focal deficit present.     Mental Status: He is alert and oriented to person, place, and time.  Psychiatric:        Mood and Affect: Mood normal.        Behavior: Behavior normal.        Thought Content: Thought content normal.        Judgment: Judgment normal.      CBC:    BMET Recent Labs    02/24/24 0506  02/25/24 0531  NA 134* 136  K 4.5 4.6  CL 89* 91*  CO2 24 28  GLUCOSE 84 82  BUN 72* 45*  CREATININE 9.23* 6.63*  CALCIUM  8.3* 8.7*     Liver Panel  Recent Labs    02/24/24 0506 02/25/24 0531  ALBUMIN  3.0* 3.1*       Sedimentation Rate Recent Labs    02/24/24 0506  ESRSEDRATE >140*   C-Reactive Protein Recent Labs    02/24/24 0506  CRP 10.7*    Micro Results: Recent Results (from the past 720 hours)  Resp panel by RT-PCR (RSV, Flu A&B, Covid) Anterior Nasal Swab     Status: None   Collection Time: 02/08/24 12:01 PM   Specimen: Anterior Nasal Swab  Result Value Ref Range Status   SARS Coronavirus 2 by RT PCR NEGATIVE NEGATIVE Final   Influenza A by PCR NEGATIVE NEGATIVE Final   Influenza B by PCR NEGATIVE NEGATIVE Final    Comment: (NOTE) The Xpert Xpress SARS-CoV-2/FLU/RSV plus assay is intended as an aid in the diagnosis of influenza from Nasopharyngeal swab specimens and should not be used as a sole basis for treatment. Nasal washings and aspirates are unacceptable for Xpert Xpress SARS-CoV-2/FLU/RSV testing.  Fact Sheet for Patients: bloggercourse.com  Fact Sheet for Healthcare Providers: seriousbroker.it  This test is not yet approved or cleared by the [place]  FDA and has been authorized for detection and/or diagnosis of SARS-CoV-2 by FDA under an Emergency Use Authorization (EUA). This EUA will remain in effect (meaning this test can be used) for the duration of the COVID-19 declaration under Section 564(b)(1) of the Act, 21 U.S.C. section 360bbb-3(b)(1), unless the authorization is terminated or revoked.     Resp Syncytial Virus by PCR NEGATIVE NEGATIVE Final    Comment: (NOTE) Fact Sheet for Patients: bloggercourse.com  Fact Sheet for Healthcare Providers: seriousbroker.it  This test is not yet approved or cleared by the [place]  [place] FDA and has been authorized for detection and/or diagnosis of SARS-CoV-2 by FDA under an Emergency Use Authorization (EUA). This EUA will remain in effect (meaning this test can be used) for the duration of the COVID-19 declaration under Section 564(b)(1) of the Act, 21 U.S.C. section 360bbb-3(b)(1), unless the authorization is terminated or revoked.  Performed at [facility], [address] [place], [name]   Blood culture (routine x 2)     Status: None   Collection Time: 02/08/24  9:53 PM   Specimen: BLOOD  Result Value Ref Range Status   Specimen Description BLOOD SITE NOT SPECIFIED  Final   Special Requests   Final    BOTTLES DRAWN AEROBIC AND ANAEROBIC Blood Culture results may not be optimal due to an inadequate volume of blood received in culture bottles   Culture   Final    NO GROWTH 5 DAYS Performed at [facility], [address] [place], [name]    Report Status 02/13/2024 FINAL  Final  Blood culture (routine x 2)     Status: None   Collection Time: 02/08/24 10:39 PM   Specimen: BLOOD  Result Value Ref Range Status   Specimen Description BLOOD BLOOD LEFT WRIST  Final   Special Requests   Final    BOTTLES DRAWN AEROBIC AND ANAEROBIC Blood Culture adequate volume   Culture   Final    NO GROWTH 5 DAYS Performed at [facility], [address] [place], [name]  Report Status 02/13/2024 FINAL  Final  Respiratory (~20 pathogens) panel by PCR     Status: None   Collection Time: 02/09/24  2:13 PM   Specimen: Nasopharyngeal Swab; Respiratory  Result Value Ref Range Status   Adenovirus NOT DETECTED NOT DETECTED Final   Coronavirus 229E NOT DETECTED NOT DETECTED Final    Comment: (NOTE) The Coronavirus on the Respiratory Panel, DOES NOT test for the novel  Coronavirus (2019 nCoV)    Coronavirus HKU1 NOT DETECTED NOT DETECTED Final   Coronavirus NL63 NOT DETECTED NOT DETECTED Final   Coronavirus OC43 NOT  DETECTED NOT DETECTED Final   Metapneumovirus NOT DETECTED NOT DETECTED Final   Rhinovirus / Enterovirus NOT DETECTED NOT DETECTED Final   Influenza A NOT DETECTED NOT DETECTED Final   Influenza B NOT DETECTED NOT DETECTED Final   Parainfluenza Virus 1 NOT DETECTED NOT DETECTED Final   Parainfluenza Virus 2 NOT DETECTED NOT DETECTED Final   Parainfluenza Virus 3 NOT DETECTED NOT DETECTED Final   Parainfluenza Virus 4 NOT DETECTED NOT DETECTED Final   Respiratory Syncytial Virus NOT DETECTED NOT DETECTED Final   Bordetella pertussis NOT DETECTED NOT DETECTED Final   Bordetella Parapertussis NOT DETECTED NOT DETECTED Final   Chlamydophila pneumoniae NOT DETECTED NOT DETECTED Final   Mycoplasma pneumoniae NOT DETECTED NOT DETECTED Final    Comment: Performed at [facility], [address], [place], [name]  Body fluid culture w Gram Stain     Status: None   Collection Time: 02/18/24  2:24 PM   Specimen: Synovium; Body Fluid  Result Value Ref Range Status   Specimen Description SYNOVIAL  Final   Special Requests RIGHT KNEE  Final   Gram Stain   Final    ABUNDANT WBC PRESENT, PREDOMINANTLY PMN NO ORGANISMS SEEN    Culture   Final    NO GROWTH 3 DAYS Performed at [facility], [address], [place], [name]    Report Status 02/22/2024 FINAL  Final  Culture, blood (Routine X 2) w Reflex to ID Panel     Status: None (Preliminary result)   Collection Time: 02/25/24  8:00 AM   Specimen: BLOOD LEFT HAND  Result Value Ref Range Status   Specimen Description BLOOD LEFT HAND  Final   Special Requests   Final    BOTTLES DRAWN AEROBIC AND ANAEROBIC Blood Culture adequate volume   Culture   Final    NO GROWTH <12 HOURS Performed at [facility], [address] [place], [name]    Report Status PENDING  Incomplete  Culture, blood (Routine X 2) w Reflex to ID Panel     Status: None (Preliminary result)   Collection Time: 02/25/24  8:03 AM    Specimen: BLOOD LEFT HAND  Result Value Ref Range Status   Specimen Description BLOOD LEFT HAND  Final   Special Requests   Final    BOTTLES DRAWN AEROBIC AND ANAEROBIC Blood Culture adequate volume   Culture   Final    NO GROWTH <12 HOURS Performed at [facility], [address] [place], [name]    Report Status PENDING  Incomplete    Studies/Results: No results found.    Assessment/Plan:  INTERVAL HISTORY: febrile   Principal Problem:   Septic arthritis of knee, right (HCC) Active Problems:   GERD (gastroesophageal reflux disease)   HLD (hyperlipidemia)   Essential hypertension   Diarrhea, unspecified   ESRD (  end stage renal disease) (HCC)   Obstructive sleep apnea   Type 2 diabetes mellitus with diabetic peripheral angiopathy without gangrene (HCC)   Sepsis (HCC)   Acute suppurative otitis media of left ear with spontaneous rupture of tympanic membrane   Mastoid disorder, bilateral   Conductive hearing loss   Acute right-sided weakness   Arthritis, septic, knee (HCC)    Carl Curls Sr. is a 64 y.o. male with with end-stage renal disease on hemodialysis who came into the hospital with right knee pain as well as problems with ear pain.  Workup revealed mastoid effusions and possible otitis media.  He was ultimately found to have a septic knee but had already been on IV antibiotics which made cultures from the knee on helpful after he had I&D by orthopedic surgery.  We did come up with a plan to giving him cefepime  and vancomycin  postoperatively with hemodialysis to complete 4 weeks of treatment.  However he has had a fever in the interim.  Of note he also had had complaints of right sided weakness MRI talked him today he said was both his shoulder and his leg that were weak and that his shoulder has been doing poorly for some time and is in need of repeat surgery.  Of note he had an MRI of the shoulder which showed a circumscribed fluid collection  in the posterior subacromial subdeltoid space measuring 5.1 x 2.9 x 1.9 cm that was favored to be a postoperative collection though he is a year out from his surgery.  #1 Fevers: concern that his right shoulder may also be infected by hematogenous event that presumably also seeded his knee  Requested that primary team  discuss with orthopedic surgery.  The patient does not want to be seen by Beverley Economy who did his original shoulder surgery but by [facility] and Dr. 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    I personally spent a total of 50 minutes in the care of the patient today including preparing to see the patient, getting/reviewing separately obtained history, performing a medically appropriate exam/evaluation, counseling and educating, placing orders, referring and communicating with other health care professionals, documenting clinical information in the EHR, independently interpreting results, communicating results, and coordinating care.   Evaluation of the patient requires  complex antimicrobial therapy evaluation, counseling , isolation needs to reduce disease transmission and risk assessment and mitigation.      LOS: 14 days   Jomarie Fleeta Rothman 02/25/2024, 4:26 PM  "

## 2024-02-25 NOTE — Plan of Care (Signed)
 " Problem: Education: Goal: Knowledge of General Education information will improve Description: Including pain rating scale, medication(s)/side effects and non-pharmacologic comfort measures 02/25/2024 0238 by Marykay Ozell LABOR, RN Outcome: Progressing 02/25/2024 0238 by Marykay Ozell LABOR, RN Outcome: Progressing   Problem: Health Behavior/Discharge Planning: Goal: Ability to manage health-related needs will improve 02/25/2024 0238 by Marykay Ozell LABOR, RN Outcome: Progressing 02/25/2024 0238 by Marykay Ozell LABOR, RN Outcome: Progressing   Problem: Clinical Measurements: Goal: Ability to maintain clinical measurements within normal limits will improve 02/25/2024 0238 by Marykay Ozell LABOR, RN Outcome: Progressing 02/25/2024 0238 by Marykay Ozell LABOR, RN Outcome: Progressing Goal: Will remain free from infection 02/25/2024 0238 by Marykay Ozell LABOR, RN Outcome: Progressing 02/25/2024 0238 by Marykay Ozell LABOR, RN Outcome: Progressing Goal: Diagnostic test results will improve 02/25/2024 0238 by Marykay Ozell LABOR, RN Outcome: Progressing 02/25/2024 0238 by Marykay Ozell LABOR, RN Outcome: Progressing Goal: Respiratory complications will improve 02/25/2024 0238 by Marykay Ozell LABOR, RN Outcome: Progressing 02/25/2024 0238 by Marykay Ozell LABOR, RN Outcome: Progressing Goal: Cardiovascular complication will be avoided 02/25/2024 0238 by Marykay Ozell LABOR, RN Outcome: Progressing 02/25/2024 0238 by Marykay Ozell LABOR, RN Outcome: Progressing   Problem: Activity: Goal: Risk for activity intolerance will decrease 02/25/2024 0238 by Marykay Ozell LABOR, RN Outcome: Progressing 02/25/2024 0238 by Marykay Ozell LABOR, RN Outcome: Progressing   Problem: Nutrition: Goal: Adequate nutrition will be maintained 02/25/2024 0238 by Marykay Ozell LABOR, RN Outcome: Progressing 02/25/2024 0238 by Marykay Ozell LABOR, RN Outcome: Progressing   Problem: Coping: Goal: Level of anxiety will decrease 02/25/2024  0238 by Marykay Ozell LABOR, RN Outcome: Progressing 02/25/2024 0238 by Marykay Ozell LABOR, RN Outcome: Progressing   Problem: Elimination: Goal: Will not experience complications related to bowel motility 02/25/2024 0238 by Marykay Ozell LABOR, RN Outcome: Progressing 02/25/2024 0238 by Marykay Ozell LABOR, RN Outcome: Progressing Goal: Will not experience complications related to urinary retention 02/25/2024 0238 by Marykay Ozell LABOR, RN Outcome: Progressing 02/25/2024 0238 by Marykay Ozell LABOR, RN Outcome: Progressing   Problem: Pain Managment: Goal: General experience of comfort will improve and/or be controlled 02/25/2024 0238 by Marykay Ozell LABOR, RN Outcome: Progressing 02/25/2024 0238 by Marykay Ozell LABOR, RN Outcome: Progressing   Problem: Safety: Goal: Ability to remain free from injury will improve 02/25/2024 0238 by Marykay Ozell LABOR, RN Outcome: Progressing 02/25/2024 0238 by Marykay Ozell LABOR, RN Outcome: Progressing   Problem: Skin Integrity: Goal: Risk for impaired skin integrity will decrease 02/25/2024 0238 by Marykay Ozell LABOR, RN Outcome: Progressing 02/25/2024 0238 by Marykay Ozell LABOR, RN Outcome: Progressing   Problem: Education: Goal: Ability to describe self-care measures that may prevent or decrease complications (Diabetes Survival Skills Education) will improve 02/25/2024 0238 by Marykay Ozell LABOR, RN Outcome: Progressing 02/25/2024 0238 by Marykay Ozell LABOR, RN Outcome: Progressing Goal: Individualized Educational Video(s) 02/25/2024 0238 by Marykay Ozell LABOR, RN Outcome: Progressing 02/25/2024 0238 by Marykay Ozell LABOR, RN Outcome: Progressing   Problem: Coping: Goal: Ability to adjust to condition or change in health will improve 02/25/2024 0238 by Marykay Ozell LABOR, RN Outcome: Progressing 02/25/2024 0238 by Marykay Ozell LABOR, RN Outcome: Progressing   Problem: Fluid Volume: Goal: Ability to maintain a balanced intake and output will improve 02/25/2024  0238 by Marykay Ozell LABOR, RN Outcome: Progressing 02/25/2024 0238 by Marykay Ozell LABOR, RN Outcome: Progressing   Problem: Health Behavior/Discharge Planning: Goal: Ability to identify and utilize available resources and services will improve 02/25/2024 0238 by Marykay Ozell LABOR, RN Outcome: Progressing 02/25/2024 [phone]  by Marykay Ozell LABOR, RN Outcome: Progressing Goal: Ability to manage health-related needs will improve 02/25/2024 0238 by Marykay Ozell LABOR, RN Outcome: Progressing 02/25/2024 0238 by Marykay Ozell LABOR, RN Outcome: Progressing   Problem: Metabolic: Goal: Ability to maintain appropriate glucose levels will improve Outcome: Progressing   Problem: Nutritional: Goal: Maintenance of adequate nutrition will improve Outcome: Progressing Goal: Progress toward achieving an optimal weight will improve Outcome: Progressing   Problem: Skin Integrity: Goal: Risk for impaired skin integrity will decrease Outcome: Progressing   Problem: Tissue Perfusion: Goal: Adequacy of tissue perfusion will improve Outcome: Progressing   Problem: Education: Goal: Knowledge of the prescribed therapeutic regimen will improve Outcome: Progressing   Problem: Bowel/Gastric: Goal: Gastrointestinal status for postoperative course will improve Outcome: Progressing   Problem: Cardiac: Goal: Ability to maintain an adequate cardiac output Outcome: Progressing Goal: Will show no evidence of cardiac arrhythmias Outcome: Progressing   Problem: Nutritional: Goal: Will attain and maintain optimal nutritional status Outcome: Progressing   Problem: Neurological: Goal: Will regain or maintain usual level of consciousness Outcome: Progressing   Problem: Clinical Measurements: Goal: Ability to maintain clinical measurements within normal limits Outcome: Progressing Goal: Postoperative complications will be avoided or minimized Outcome: Progressing   Problem: Respiratory: Goal: Will regain  and/or maintain adequate ventilation Outcome: Progressing Goal: Respiratory status will improve Outcome: Progressing   Problem: Skin Integrity: Goal: Demonstrates signs of wound healing without infection Outcome: Progressing   Problem: Urinary Elimination: Goal: Will remain free from infection Outcome: Progressing Goal: Ability to achieve and maintain adequate urine output Outcome: Progressing   "

## 2024-02-25 NOTE — Progress Notes (Signed)
 Physical Therapy Treatment Patient Details Name: Carl Durflinger Sr. MRN: [id number] DOB: 1960-04-21 Today's Date: 02/25/2024   History of Present Illness Pt is a 64 y.o. male who presented 02/07/24 with progressive R-sided weakness and pain along with L ear pain and drainage. Pt septic with L acute otitis media. MRI brain showed no acute findings. MRI cervical spine demonstrated no interval change from prior surgery and no clear etiology for current weakness. Imaging did note prior left-sided strokes, without evidence of acute stroke or recrudescence. Neurosurgery consulted and recommends no acute intervention. R weakness suspected recrudescence of prior symptoms in setting of severe infection. Underwent R knee I&D on 02/21/24 for septic arthritis.  PMH includes: recurrent GIB, epilepsy, PRES, TIA, HTN, renal cell carcinoma, CVA, OSA, ESRD, DM II, Ischemic colitis, anterior cervical corpectomy C4 and C3-C5 fusion (Dr. [name], 05/25/2023), PAD s/p angioplasty/stenting right SFA and popliteal arteries 12/09/2023 for critical limb ischemia.    PT Comments  Patient progressing with improved tolerance to standing today with less R knee pain, improved erect posture in standing and tolerated up on his feet longer (about 20 seconds x 2 trials).  He was unable to take a step to move toward [facility] while standing without LOB needing A to sit on EOB.  He had slight improvement in R knee extension strength compared to yesterday.  Feel he will continue to benefit from skilled PT in the acute setting and remains appropriate for post-acute inpatient rehab (>3 hours/day) prior to d/c home.    If plan is discharge home, recommend the following: Two people to help with walking and/or transfers;Two people to help with bathing/dressing/bathroom;Assistance with cooking/housework;Direct supervision/assist for medications management;Direct supervision/assist for financial management;Assist for transportation;Help with stairs or ramp for  entrance   Can travel by private vehicle        Equipment Recommendations  BSC/3in1    Recommendations for Other Services       Precautions / Restrictions Precautions Precautions: Fall Precaution/Restrictions Comments: watch HR, R knee buckles Restrictions Weight Bearing Restrictions Per Provider Order: No RLE Weight Bearing Per Provider Order: Weight bearing as tolerated Other Position/Activity Restrictions: no weight bearing restrictions     Mobility  Bed Mobility Overal bed mobility: Needs Assistance Bed Mobility: Supine to Sit, Sit to Supine     Supine to sit: Min assist, HOB elevated, Used rails Sit to supine: Min assist, Used rails   General bed mobility comments: up to EOB already moving R leg off EOB, helped a little to gently lower leg, pt able to lift trunk and scoot with extra time to EOB; to supine assist for R leg onto bed and pt able to pull up to [facility] using headboard with L LE And bed in trendelenberg    Transfers Overall transfer level: Needs assistance Equipment used: Rolling walker (2 wheels) Transfers: Sit to/from Stand Sit to Stand: Mod assist, +2 safety/equipment, From elevated surface           General transfer comment: stood to RW x 4 today with A from higher surface and cues for pushing up through arms from walker and facilitation for trunk and hip extension, second trial pt unable to lift with legs (height of bed lower) so had to assist to lower back to EOB to prevent falls    Ambulation/Gait Ambulation/Gait assistance: Mod assist, +2 physical assistance Gait Distance (Feet): 0 Feet Assistive device: Rolling walker (2 wheels)         General Gait Details: attempted to side step toward [facility]  while standint but pt unable to move R foot and LOB when attempting to help him move it so lowered to EOB.   Stairs             Wheelchair Mobility     Tilt Bed    Modified Rankin (Stroke Patients Only)       Balance Overall balance  assessment: Needs assistance   Sitting balance-Leahy Scale: Good Sitting balance - Comments: EOB   Standing balance support: Bilateral upper extremity supported Standing balance-Leahy Scale: Poor Standing balance comment: UE support and assist for balance in standing                            Communication Communication Communication: No apparent difficulties  Cognition Arousal: Alert Behavior During Therapy: WFL for tasks assessed/performed   PT - Cognitive impairments: No apparent impairments                         Following commands: Intact      Cueing Cueing Techniques: Verbal cues, Tactile cues, Visual cues  Exercises General Exercises - Lower Extremity Short Arc Quad: AAROM, Right, 10 reps, Supine (5 sec hold with hip adductor squeeze and A for lifting foot)    General Comments General comments (skin integrity, edema, etc.): R knee wrapped, pt reporting R shoulder also potential cause of infection per MD.  On the phone with [facility] upon PT entry and assisted for finding out reason for denial for rehab.  Communicated with MD and rehab coordinators for appeal.      Pertinent Vitals/Pain Pain Assessment Pain Assessment: Faces Faces Pain Scale: Hurts little more Pain Location: R knee Pain Descriptors / Indicators: Aching, Discomfort, Tender Pain Intervention(s): Monitored during session, Repositioned    Home Living                          Prior Function            PT Goals (current goals can now be found in the care plan section) Progress towards PT goals: Progressing toward goals    Frequency    Min 3X/week      PT Plan      Co-evaluation              AM-PAC PT 6 Clicks Mobility   Outcome Measure  Help needed turning from your back to your side while in a flat bed without using bedrails?: A Little Help needed moving from lying on your back to sitting on the side of a flat bed without using bedrails?: A Lot Help  needed moving to and from a bed to a chair (including a wheelchair)?: Total Help needed standing up from a chair using your arms (e.g., wheelchair or bedside chair)?: Total Help needed to walk in hospital room?: Total Help needed climbing 3-5 steps with a railing? : Total 6 Click Score: 9    End of Session Equipment Utilized During Treatment: Gait belt Activity Tolerance: Patient tolerated treatment well Patient left: in bed;with call bell/phone within reach;with bed alarm set   PT Visit Diagnosis: Muscle weakness (generalized) (M62.81);Pain;Difficulty in walking, not elsewhere classified (R26.2) Pain - Right/Left: Right Pain - part of body: Knee     Time: 1215-1248 PT Time Calculation (min) (ACUTE ONLY): 33 min  Charges:    $Therapeutic Exercise: 8-22 mins $Therapeutic Activity: 8-22 mins PT General Charges $$ ACUTE PT  VISIT: 1 Visit                     Micheline Portal, PT Acute Rehabilitation Services Office:[phone] 02/25/2024    Montie Portal 02/25/2024, 2:32 PM

## 2024-02-25 NOTE — Progress Notes (Signed)
" ° °  Inpatient Rehabilitation Admissions Coordinator   Insurance denied CIR admit on 1/5. I met with patient at bedside and discussed appeals process. I will begin appeal today .  Heron Leavell, RN, MSN Rehab Admissions Coordinator [phone] 02/25/2024 1:00 PM  "

## 2024-02-25 NOTE — Progress Notes (Signed)
 Order received for R shoulder aspiration in fluoro. Not a glenohumeral collection based on recent MRI and would not be APP procedure in fluoro. Shared case with Dr. Philip in IR; patient to be brought to IR for evaluation with US .

## 2024-02-25 NOTE — TOC Progression Note (Signed)
 Transition of Care (TOC) - Progression Note    Patient Details  Name: Carl Metzgar Sr. MRN: [id number] Date of Birth: 1960/07/08  Transition of [facility]) CM/SW Contact  Sherline Clack, [place] Phone Number: 02/25/2024, 11:30 AM  Clinical Narrative:     CSW met with patient at bedside to discuss SNF placement for discharge. Patient told CSW he did not want to go to SNF due to a previous bad experience at [facility]. Patient would like to appeal CIR insurance denial and requested auth ID to call [facility] appeals himself. CSW provided patient with denial letter and auth ID. CSW will continue to follow.   Expected Discharge Plan: Skilled Nursing Facility Barriers to Discharge: SNF Pending bed offer               Expected Discharge Plan and Services       Living arrangements for the past 2 months: Single Family Home                                       Social Drivers of Health (SDOH) Interventions SDOH Screenings   Food Insecurity: No Food Insecurity (02/10/2024)  Housing: Low Risk (02/10/2024)  Transportation Needs: No Transportation Needs (02/10/2024)  Utilities: Not At Risk (02/10/2024)  Depression (PHQ2-9): Medium Risk (08/19/2023)  Social Connections: Socially Isolated (02/10/2024)  Tobacco Use: Medium Risk (02/21/2024)    Readmission Risk Interventions    05/30/2023   12:03 PM  Readmission Risk Prevention Plan  Transportation Screening Complete  HRI or Home Care Consult Complete  Social Work Consult for Recovery Care Planning/Counseling Complete  Palliative Care Screening Not Applicable  Medication Review Oceanographer) Complete

## 2024-02-25 NOTE — Progress Notes (Signed)
 Nutrition Follow-up  INTERVENTION:   -[place] 1.4 PO daily, each provides 455 kcals and 20g protein  -Prosource Plus PO BID, each provides 100 kcals and 15g protein  -Renavit daily  NUTRITION DIAGNOSIS:   Increased nutrient needs related to chronic illness as evidenced by estimated needs.  Ongoing.  GOAL:   Patient will meet greater than or equal to 90% of their needs  Progressing.  MONITOR:   PO intake, Supplement acceptance, Labs, Weight trends, I & O's  ASSESSMENT:   Patient presented with extremity weakness, falls, neck pain, fever and ear drainage and was found to have sepsis secondary to postop otitis media and tympanic membrane perforation. 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  12/27: admitted 12/29: HD 1/6: s/p rt knee aspiration and injection 1/9: HD, s/p I&D right knee 1/12:HD  Patient in room, eating better but feels tired of eating the same things off the menu. Mainly consuming eggs, pot roast and meat loaf. Ate half an omelet and most of his french toast this morning. Likes and takes Prosource supplements with no issue. Willing to try [facility], will order once daily.   Per nephrology note, pt to start HD schedule of MWF.  EDW: 87.3 kg (192 lbs) Admission weight: 193 lbs Current weight: 188 lbs  Medications: Ferrous sulfate , Folic acid , Rena-vit, Metamucil, Renvela   Labs reviewed: CBGs: 86-97  Elevated Phos -trending down  Diet Order:   Diet Order             Diet renal with fluid restriction Fluid restriction: 1200 mL Fluid; Room service appropriate? Yes; Fluid consistency: Thin  Diet effective now                   EDUCATION NEEDS:   Education needs have been addressed  Skin:  Skin Assessment: Skin Integrity  Issues: Skin Integrity Issues:: Incisions Incisions: 1/9 right leg  Last BM:  1/12 -type 1  Height:   Ht Readings from Last 1 Encounters:  02/21/24 6' 1 (1.854 m)    Weight:   Wt Readings from Last 1 Encounters:  02/24/24 85.4 kg    Ideal Body Weight:  83.6 kg  BMI:  Body mass index is 24.84 kg/m.  Estimated Nutritional Needs:   Kcal:  2100-2400  Protein:  125-150  Fluid:  1.2 L fluid restriction noted   Morna Lee, MS, RD, LDN Inpatient Clinical Dietitian Contact via Secure chat

## 2024-02-25 NOTE — Progress Notes (Signed)
 " PROGRESS NOTE    Carl Conery Sr.  FMW:[id number] DOB: 11/28/1960 DOA: 02/08/2024 PCP: [name], MD   Brief Narrative: 64 year old with past medical history significant for ESRD on hemodialysis MWF, PVD s/p angioplasty right S FA October 2025 for ischemic rest pain as well as C4 spinal fracture April 2025 with incomplete spinal cord injury requiring C4 corpectomy, fusion and interbody cage by Dr. [name], wheelchair-bound for a time but progressed by Agurs to walking with a cane and driving, presented with right arm and leg weakness, fall as well as posterior neck pain, fever, ear drainage.  Admitted started on antibiotics, neurosurgery, neurology orthopedic ENT and nephrology consulted.  Diagnosed with otitis media and ruptured eardrum.  Right-sided weakness believed to be recrudescence of old spinal cord injury symptoms in the setting of infection.  He underwent Arthrocentesis of right knee, synovial fluid consistent with septic arthritis, underwent irrigation and debridement by Dr. [name] on 1/9. ID consulted recommend 4 weeks of IV antibiotics with HD> Vancomycin  and cefepime . End date 03/23/2024.  Spike fever 1/12 overnight. ID will follow up on patient today. ID also recommend Ortho evaluation for fluid collection of Right Shoulder. Ortho will consult IR for aspiration.   Assessment & Plan:   Principal Problem:   Septic arthritis of knee, right (HCC) Active Problems:   GERD (gastroesophageal reflux disease)   HLD (hyperlipidemia)   Essential hypertension   Diarrhea, unspecified   ESRD (end stage renal disease) (HCC)   Obstructive sleep apnea   Type 2 diabetes mellitus with diabetic peripheral angiopathy without gangrene (HCC)   Sepsis (HCC)   Acute suppurative otitis media of left ear with spontaneous rupture of tympanic membrane   Mastoid disorder, bilateral   Conductive hearing loss   Acute right-sided weakness   Arthritis, septic, knee (HCC)   1-Sepsis due to otitis  media with suspected TM perforation and Right Knee septic arthritis.  - Patient met sepsis criteria on admission suspected due to suppurative otitis media.  Blood cultures no growth to date.  COVID flu RSV negative - ENT [name] was consulted and patient underwent CT of the temporal bones without evidence of coalescent mastoiditis, bone breakdown or abscess -He completed 10 days of antibiotics for otitis.  - Patient had low-grade fever 1/6. No further fever since yesterday. If spike fever will need repeat blood cultures, workup -needs follow up with ENT on January 5 -- 12th. I sent message through secure chat to Dr [name].  - Treated with  Ciprodex  drops, ENT recommend it for 14 days. Completed.    Right-sided weakness History of cervical decompression corpectomy C4 by [name] April 2025 Remote left occipital infarct - There was some concern for recurrent spinal cord injury, neurosurgery and neurology were consulted.  A spinal MRI showed no new cord injury. - It was thought that his symptoms were recrudescence of old  symptoms due to  acute infection.   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  Had knee aspiration and injection on  1/06  -knee synovial fluid, no growth to date.  -WBC Synovial fluid notice to be 71,000.  -Treated with Augmentin  and Linezolid .  -ID consulted , recommend IV antibiotics with HD for 4 weeks.  Ortho consulted for evaluation of fluid collection right shoulder in setting of persistent fever.   -Peripheral vascular disease s/p angioplasty October 2025; - Continue aspirin  Lipitor and Plavix   ESRD on  hemodialysis: MWF - Nephrology following.   -Lokelma  c daily  Continue with HD.   Chronic hypotension, asymptomatic -Continue midodrine  Refuse meds at times.  ECHO normal EF. 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  Per cardiology small mobile echodensity on the ventricular aspect of the aortic valve is likely degenerative changes of the valve.  Also blood cultures has been negative. -spike fever, repeat blood cultures.  -ID informed of echo finding.   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 He would not accept blood transfusion do to religious believes.  Monitor hemoglobin    Nutrition Problem: Increased nutrient needs Etiology: chronic illness    Signs/Symptoms: estimated needs    Interventions: Refer to RD note for recommendations, MVI  Estimated body mass index is 24.84 kg/m as calculated from the following:   Height as of this encounter: 6' 1 (1.854 m).   Weight as of this encounter: 85.4 kg.   DVT prophylaxis: Heparin  Code Status: Full code Family Communication: Care discussed with patient Disposition Plan:  Status is: Inpatient Remains inpatient appropriate because:  Right shoulder evaluation, Plan to proceed with appeal for CIR> patient would benefit from CIR admission. He has multiples medical problems, needs IV antibiotics , HD....    Consultants:  Nephrology ENT Neurology  Procedures:  HD  Antimicrobials:    Subjective: He report some pain right shoulder. Knee pain is not worse.     Objective: Vitals:   02/25/24 0042 02/25/24 0430 02/25/24 0723 02/25/24 1124  BP: (!) 88/59 109/65 98/61 (!) 86/55  Pulse: 79 79 77 76  Resp: 18 18 16 18   Temp: 98.6 F (37 C) 98.3 F (36.8 C) 99.1 F (37.3 C) 98 F (36.7 C)  TempSrc: Oral Oral    SpO2: 98% 98% 97% 100%  Weight:      Height:        Intake/Output Summary (Last 24 hours) at 02/25/2024 1242 Last data filed at 02/25/2024 0406 Gross per 24 hour  Intake 100 ml  Output --  Net 100  ml   Filed Weights   02/24/24 0500 02/24/24 0746 02/24/24 1137  Weight: 85.9 kg 85.8 kg 85.4 kg    Examination:  General exam; NAD Respiratory system: CTA Cardiovascular system: S 1, S 2 RRR Gastrointestinal system: BBS present, soft, nt Central nervous system: alert Extremities: Right knee with dressing     Data Reviewed: I have personally reviewed following labs and imaging studies  CBC: Recent Labs  Lab 02/18/24 1823 02/21/24 0505 02/21/24 1237 02/23/24 1053 02/24/24 0757  WBC 9.0 7.2  --  9.9 9.5  NEUTROABS 7.2  --   --   --   --   HGB 9.9* 10.4* 9.5* 7.9* 8.1*  HCT 30.2* 33.5* 28.0* 24.7* 25.3*  MCV 88.6 93.8  --  90.8 91.0  PLT 501* 517*  --  474* 478*   Basic Metabolic Panel: Recent Labs  Lab 02/21/24 0505 02/21/24 1237 02/22/24 0534 02/23/24 0557 02/24/24 0506 02/25/24 0531  NA 133* 134* 133* 135 134* 136  K 5.5* 4.1 5.7* 4.6 4.5 4.6  CL 90* 93* 92* 91* 89* 91*  CO2 24  --  26 26 24 28   GLUCOSE 84 76 105* 83 84 82  BUN 59* 32* 42* 61* 72* 45*  CREATININE  7.62* 4.50* 5.37* 7.43* 9.23* 6.63*  CALCIUM  8.9  --  8.9 8.6* 8.3* 8.7*  PHOS 5.7*  --  5.4* 6.5* 6.7* 4.7*   GFR: Estimated Creatinine Clearance: 12.6 mL/min (A) (by C-G formula based on SCr of 6.63 mg/dL (H)). Liver Function Tests: Recent Labs  Lab 02/21/24 0505 02/22/24 0534 02/23/24 0557 02/24/24 0506 02/25/24 0531  ALBUMIN  2.9* 2.8* 2.8* 3.0* 3.1*   No results for input(s): LIPASE, AMYLASE in the last 168 hours. No results for input(s): AMMONIA in the last 168 hours. Coagulation Profile: No results for input(s): INR, PROTIME in the last 168 hours. Cardiac Enzymes: No results for input(s): CKTOTAL, CKMB, CKMBINDEX, TROPONINI in the last 168 hours. BNP (last 3 results) No results for input(s): PROBNP in the last 8760 hours. HbA1C: No results for input(s): HGBA1C in the last 72 hours. CBG: Recent Labs  Lab 02/24/24 1247 02/24/24 1827 02/24/24 2303  02/25/24 0724 02/25/24 1122  GLUCAP 80 95 81 86 97   Lipid Profile: No results for input(s): CHOL, HDL, LDLCALC, TRIG, CHOLHDL, LDLDIRECT in the last 72 hours. Thyroid  Function Tests: No results for input(s): TSH, T4TOTAL, FREET4, T3FREE, THYROIDAB in the last 72 hours. Anemia Panel: No results for input(s): VITAMINB12, FOLATE, FERRITIN, TIBC, IRON, RETICCTPCT in the last 72 hours. Sepsis Labs: Recent Labs  Lab 02/18/24 1823  LATICACIDVEN 1.2    Recent Results (from the past 240 hours)  Body fluid culture w Gram Stain     Status: None   Collection Time: 02/18/24  2:24 PM   Specimen: Synovium; Body Fluid  Result Value Ref Range Status   Specimen Description SYNOVIAL  Final   Special Requests RIGHT KNEE  Final   Gram Stain   Final    ABUNDANT WBC PRESENT, PREDOMINANTLY PMN NO ORGANISMS SEEN    Culture   Final    NO GROWTH 3 DAYS Performed at [facility], [address] [place], [name]    Report Status 02/22/2024 FINAL  Final  Culture, blood (Routine X 2) w Reflex to ID Panel     Status: None (Preliminary result)   Collection Time: 02/25/24  8:00 AM   Specimen: BLOOD LEFT HAND  Result Value Ref Range Status   Specimen Description BLOOD LEFT HAND  Final   Special Requests   Final    BOTTLES DRAWN AEROBIC AND ANAEROBIC Blood Culture adequate volume   Culture   Final    NO GROWTH <12 HOURS Performed at [facility], [address], [place], [name]    Report Status PENDING  Incomplete  Culture, blood (Routine X 2) w Reflex to ID Panel     Status: None (Preliminary result)   Collection Time: 02/25/24  8:03 AM   Specimen: BLOOD LEFT HAND  Result Value Ref Range Status   Specimen Description BLOOD LEFT HAND  Final   Special Requests   Final    BOTTLES DRAWN AEROBIC AND ANAEROBIC Blood Culture adequate volume   Culture   Final    NO GROWTH <12 HOURS Performed at [facility], [address],  [place], [name]    Report Status PENDING  Incomplete         Radiology Studies: No results found.       Scheduled Meds:  (feeding supplement) PROSource Plus  30 mL Oral BID BM   aspirin  EC  81 mg Oral Daily   atorvastatin   10 mg Oral Daily   bupivacaine (PF)  10 mL Infiltration Once   Chlorhexidine  Gluconate Cloth  6 each Topical Q0600   Chlorhexidine  Gluconate Cloth  6 each Topical Q0600   cinacalcet   120 mg Oral Q breakfast   clopidogrel   75 mg Oral Daily   darbepoetin (ARANESP ) injection - DIALYSIS  40 mcg Subcutaneous Q Fri-1800   feeding supplement (KATE FARMS STANDARD 1.4) Liquid  325 mL Oral Daily   ferrous sulfate   325 mg Oral Q breakfast   folic acid   1 mg Oral Daily   gabapentin   300 mg Oral QHS   insulin  aspart  0-6 Units Subcutaneous TID WC   lidocaine   1 patch Transdermal Q24H   lidocaine   1 patch Transdermal Q24H   midodrine   10 mg Oral TID WC   multivitamin  1 tablet Oral QHS   pantoprazole   40 mg Oral BID AC   psyllium  1 packet Oral Daily   sevelamer  carbonate  2,400 mg Oral TID WC   sodium chloride  flush  3 mL Intravenous Q12H   Continuous Infusions:  ceFEPime  (MAXIPIME ) IV Stopped (02/24/24 1852)   [START ON 02/26/2024] vancomycin        LOS: 14 days    Time spent: 35 Minutes    Nafisa Olds A Kenwood Rosiak, MD [facility]   If 7PM-7AM, please contact night-coverage [email or web address]  02/25/2024, 12:42 PM   "

## 2024-02-26 DIAGNOSIS — M009 Pyogenic arthritis, unspecified: Secondary | ICD-10-CM | POA: Diagnosis not present

## 2024-02-26 DIAGNOSIS — H66012 Acute suppurative otitis media with spontaneous rupture of ear drum, left ear: Secondary | ICD-10-CM | POA: Diagnosis not present

## 2024-02-26 DIAGNOSIS — N186 End stage renal disease: Secondary | ICD-10-CM | POA: Diagnosis not present

## 2024-02-26 DIAGNOSIS — I10 Essential (primary) hypertension: Secondary | ICD-10-CM

## 2024-02-26 DIAGNOSIS — M25511 Pain in right shoulder: Secondary | ICD-10-CM | POA: Diagnosis not present

## 2024-02-26 DIAGNOSIS — L0291 Cutaneous abscess, unspecified: Secondary | ICD-10-CM | POA: Diagnosis not present

## 2024-02-26 DIAGNOSIS — R29898 Other symptoms and signs involving the musculoskeletal system: Secondary | ICD-10-CM | POA: Diagnosis not present

## 2024-02-26 LAB — RENAL FUNCTION PANEL
Albumin: 2.9 g/dL — ABNORMAL LOW (ref 3.5–5.0)
Anion gap: 17 — ABNORMAL HIGH (ref 5–15)
BUN: 60 mg/dL — ABNORMAL HIGH (ref 8–23)
CO2: 27 mmol/L (ref 22–32)
Calcium: 8.2 mg/dL — ABNORMAL LOW (ref 8.9–10.3)
Chloride: 89 mmol/L — ABNORMAL LOW (ref 98–111)
Creatinine, Ser: 8.43 mg/dL — ABNORMAL HIGH (ref 0.61–1.24)
GFR, Estimated: 6 mL/min — ABNORMAL LOW
Glucose, Bld: 116 mg/dL — ABNORMAL HIGH (ref 70–99)
Phosphorus: 5.5 mg/dL — ABNORMAL HIGH (ref 2.5–4.6)
Potassium: 5 mmol/L (ref 3.5–5.1)
Sodium: 133 mmol/L — ABNORMAL LOW (ref 135–145)

## 2024-02-26 LAB — GLUCOSE, CAPILLARY
Glucose-Capillary: 117 mg/dL — ABNORMAL HIGH (ref 70–99)
Glucose-Capillary: 106 mg/dL — ABNORMAL HIGH (ref 70–99)
Glucose-Capillary: 104 mg/dL — ABNORMAL HIGH (ref 70–99)
Glucose-Capillary: 90 mg/dL (ref 70–99)
Glucose-Capillary: 94 mg/dL (ref 70–99)

## 2024-02-26 MED ORDER — OXYCODONE-ACETAMINOPHEN 5-325 MG PO TABS
ORAL_TABLET | ORAL | Status: AC
  Filled 2024-02-26: qty 1

## 2024-02-26 MED ORDER — HEPARIN SODIUM (PORCINE) 1000 UNIT/ML IJ SOLN
INTRAMUSCULAR | Status: AC
  Filled 2024-02-26: qty 2

## 2024-02-26 MED ORDER — HEPARIN SODIUM (PORCINE) 5000 UNIT/ML IJ SOLN
5000.0000 [IU] | Freq: Three times a day (TID) | INTRAMUSCULAR | Status: DC
  Administered 2024-02-26 – 2024-02-27 (×2): 5000 [IU] via SUBCUTANEOUS
  Filled 2024-02-26 (×3): qty 1

## 2024-02-26 MED ORDER — HEPARIN SODIUM (PORCINE) 1000 UNIT/ML IJ SOLN
2000.0000 [IU] | Freq: Once | INTRAMUSCULAR | Status: AC
  Administered 2024-02-26: 2000 [IU] via INTRAVENOUS

## 2024-02-26 MED ORDER — VANCOMYCIN HCL IN DEXTROSE 1-5 GM/200ML-% IV SOLN
INTRAVENOUS | Status: AC
  Filled 2024-02-26: qty 200

## 2024-02-26 NOTE — Progress Notes (Signed)
 [facility]  PROGRESS NOTE  Carl Matty Sr. FMW:[id number] DOB: 06/11/1960 DOA: 02/08/2024 PCP: [name], MD   Brief HPI:   64 year old with past medical history significant for ESRD on hemodialysis MWF, PVD s/p angioplasty right S FA October 2025 for ischemic rest pain as well as C4 spinal fracture April 2025 with incomplete spinal cord injury requiring C4 corpectomy, fusion and interbody cage by Dr. [name], wheelchair-bound for a time but progressed by Agurs to walking with a cane and driving, presented with right arm and leg weakness, fall as well as posterior neck pain, fever, ear drainage.   Admitted started on antibiotics, neurosurgery, neurology orthopedic ENT and nephrology consulted.   Diagnosed with otitis media and ruptured eardrum.  Right-sided weakness believed to be recrudescence of old spinal cord injury symptoms in the setting of infection.   He underwent Arthrocentesis of right knee, synovial fluid consistent with septic arthritis, underwent irrigation and debridement by Dr. [name] on 1/9. ID consulted recommend 4 weeks of IV antibiotics with HD> Vancomycin  and cefepime . End date 03/23/2024.   Spike fever 1/12 overnight. ID will follow up on patient today. ID also recommend Ortho evaluation for fluid collection of Right Shoulder. Ortho will consult IR for aspiration.       Assessment/Plan:   Sepsis due to otitis media with suspected TM perforation, right knee septic arthritis - Met sepsis criteria on admission; sepsis physiology has resolved - Seen by ENT, underwent CT of temporal bone without evidence of coalescent mastoiditis, bone lesion or abscess - Completed 10 days of antibiotics for otitis media  -needs follow up with ENT on January 5 -- 12th. I sent message through secure chat to Dr [name].  - Treated with  Ciprodex  drops, ENT recommend it for 14 days. Completed.      Right-sided weakness History of cervical decompression corpectomy C4 by [name] April  2025 Remote left occipital infarct - There was some concern for recurrent spinal cord injury, neurosurgery and neurology were consulted.   -A spinal MRI showed no new cord injury. - It was thought that his symptoms were recrudescence of old  symptoms due to  acute infection.    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  Had knee aspiration and injection on  1/06  -knee synovial fluid, no growth to date.  -WBC Synovial fluid notice to be 71,000.  -Treated with Augmentin  and Linezolid .  -ID consulted , recommend IV antibiotics with HD for 4 weeks.  Ortho consulted for evaluation of fluid collection right shoulder in setting of persistent fever.  -IR was consulted and small amount of fluid in soft tissue anterior and posterior to right shoulder aspirated. - Fluid culture showed no growth   -Peripheral vascular disease s/p angioplasty October 2025; - Continue aspirin  Lipitor and Plavix    ESRD on hemodialysis: MWF - Nephrology following.   -Lokelma   daily  Continue with HD.    Chronic hypotension, asymptomatic -Continue midodrine  Refuse meds at times.  ECHO normal EF. 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  Per cardiology small mobile echodensity on the ventricular aspect of the aortic valve is likely degenerative changes of the valve.  Also blood cultures has been negative. -spike fever, repeat blood cultures.  -ID informed of echo finding.  -No plan to do TEE as this would not change management as per  ID.  He would not be candidate for CT surgery and plan to get 6 weeks of vancomycin  and cefepime .   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 He would not accept blood transfusion do to religious believes.  Monitor hemoglobin        DVT prophylaxis: Heparin   Medications     (feeding supplement) PROSource Plus  30 mL Oral BID BM   aspirin  EC  81 mg Oral Daily   atorvastatin   10 mg Oral Daily   bupivacaine (PF)  10 mL Infiltration Once   Chlorhexidine  Gluconate Cloth  6 each Topical Q0600   Chlorhexidine  Gluconate Cloth  6 each Topical Q0600   cinacalcet   120 mg Oral Q breakfast   clopidogrel   75 mg Oral Daily   darbepoetin (ARANESP ) injection - DIALYSIS  40 mcg Subcutaneous Q Fri-1800   feeding supplement (KATE FARMS STANDARD 1.4) Liquid  325 mL Oral Daily   ferrous sulfate   325 mg Oral Q breakfast   folic acid   1 mg Oral Daily   gabapentin   300 mg Oral QHS   insulin  aspart  0-6 Units Subcutaneous TID WC   lidocaine   1 patch Transdermal Q24H   lidocaine   1 patch Transdermal Q24H   midodrine   10 mg Oral TID WC   multivitamin  1 tablet Oral QHS   pantoprazole   40 mg Oral BID AC   psyllium  1 packet Oral Daily   sevelamer  carbonate  2,400 mg Oral TID WC   sodium chloride  flush  3 mL Intravenous Q12H     Data Reviewed:   CBG:  Recent Labs  Lab 02/25/24 0724 02/25/24 1122 02/25/24 2038 02/26/24 0703 02/26/24 0804  GLUCAP 86 97 128* 117* 106*    SpO2: 99 % O2 Flow Rate (L/min): 2 L/min    Vitals:   02/26/24 0905 02/26/24 0950 02/26/24 1010 02/26/24 1020  BP: (!) 159/82 137/77 121/73 117/75  Pulse: 88 94 (!) 101 99  Resp: 16 16 16 20   Temp:      TempSrc:      SpO2: 97% 98% 100% 99%  Weight:      Height:          Data Reviewed:  Basic Metabolic Panel: Recent Labs  Lab 02/22/24 0534 02/23/24 0557 02/24/24 0506 02/25/24 0531 02/26/24 0304  NA 133* 135 134* 136 133*  K 5.7* 4.6 4.5 4.6 5.0  CL 92* 91* 89* 91* 89*  CO2 26 26 24 28 27   GLUCOSE 105* 83 84 82 116*  BUN 42* 61* 72* 45* 60*  CREATININE 5.37* 7.43* 9.23* 6.63* 8.43*  CALCIUM  8.9 8.6* 8.3* 8.7*  8.2*  PHOS 5.4* 6.5* 6.7* 4.7* 5.5*    CBC: Recent Labs  Lab 02/21/24 0505 02/21/24 1237 02/23/24 1053 02/24/24 0757  WBC 7.2  --  9.9 9.5  HGB 10.4* 9.5* 7.9* 8.1*  HCT 33.5* 28.0* 24.7* 25.3*  MCV 93.8  --  90.8 91.0  PLT 517*  --  474* 478*    LFT Recent Labs  Lab 02/22/24 0534 02/23/24 0557 02/24/24 0506 02/25/24 0531 02/26/24 0304  ALBUMIN  2.8* 2.8* 3.0* 3.1* 2.9*     Antibiotics: Anti-infectives (From admission, onward)    Start     Dose/Rate Route Frequency Ordered Stop   02/26/24 1200  vancomycin  (VANCOCIN ) IVPB 1000 mg/200 mL premix  1,000 mg 200 mL/hr over 60 Minutes Intravenous Every M-W-F (Hemodialysis) 02/24/24 0951     02/24/24 1500  vancomycin  (VANCOREADY) IVPB 2000 mg/400 mL        2,000 mg 200 mL/hr over 120 Minutes Intravenous  Once 02/24/24 0945 02/24/24 1854   02/24/24 1200  ceFEPIme  (MAXIPIME ) 2 g in sodium chloride  0.9 % 100 mL IVPB        2 g 200 mL/hr over 30 Minutes Intravenous Every M-W-F (Hemodialysis) 02/24/24 0945     02/24/24 0000  ceFEPime  (MAXIPIME ) IVPB        2 g Intravenous Every M-W-F (Hemodialysis) 02/24/24 1121 03/23/24 2359   02/24/24 0000  vancomycin  IVPB        1,000 mg Intravenous Every M-W-F (Hemodialysis) 02/24/24 1121 03/23/24 2359   02/22/24 1000  amoxicillin -clavulanate (AUGMENTIN ) 500-125 MG per tablet 1 tablet  Status:  Discontinued        1 tablet Oral 2 times daily 02/22/24 0935 02/24/24 0945   02/22/24 0600  ceFAZolin  (ANCEF ) IVPB 2g/100 mL premix  Status:  Discontinued        2 g 200 mL/hr over 30 Minutes Intravenous On call to O.R. 02/21/24 1200 02/21/24 1605   02/21/24 2200  amoxicillin -clavulanate (AUGMENTIN ) 500-125 MG per tablet 1 tablet  Status:  Discontinued        1 tablet Oral Daily at bedtime 02/20/24 1042 02/22/24 0935   02/20/24 1130  linezolid  (ZYVOX ) tablet 600 mg  Status:  Discontinued        600 mg Oral Every 12 hours 02/20/24 1037 02/24/24 0945   02/20/24 1130  amoxicillin -clavulanate  (AUGMENTIN ) 875-125 MG per tablet 1 tablet  Status:  Discontinued        1 tablet Oral Every 12 hours 02/20/24 1038 02/20/24 1041   02/20/24 1130  amoxicillin -clavulanate (AUGMENTIN ) 500-125 MG per tablet 1 tablet        1 tablet Oral  Once 02/20/24 1042 02/20/24 1329   02/19/24 1545  amoxicillin -clavulanate (AUGMENTIN ) 500-125 MG per tablet 1 tablet  Status:  Discontinued        1 tablet Oral  Once 02/19/24 1527 02/20/24 1038   02/17/24 0615  amoxicillin -clavulanate (AUGMENTIN ) 500-125 MG per tablet 1 tablet        1 tablet Oral Every 12 hours 02/17/24 0556 02/18/24 2151   02/09/24 0600  piperacillin -tazobactam (ZOSYN ) IVPB 2.25 g  Status:  Discontinued        2.25 g 100 mL/hr over 30 Minutes Intravenous Every 8 hours 02/09/24 0135 02/17/24 0556   02/08/24 2200  amoxicillin -clavulanate (AUGMENTIN ) 875-125 MG per tablet 1 tablet  Status:  Discontinued        1 tablet Oral Every 12 hours 02/08/24 2104 02/09/24 0135   02/08/24 2200  piperacillin -tazobactam (ZOSYN ) IVPB 3.375 g        3.375 g 100 mL/hr over 30 Minutes Intravenous  Once 02/08/24 2153 02/08/24 2314   02/08/24 0000  amoxicillin -clavulanate (AUGMENTIN ) 875-125 MG tablet  Status:  Discontinued        1 tablet Oral Every 12 hours 02/08/24 1908 02/24/24         CONSULTS nephrology, ID  Code Status: Full code  Family Communication: No family at bedside     Subjective   Patient seen during dialysis.  Denies any complaints.   Objective    Physical Examination:   General-appears in no acute distress Heart-S1-S2, regular, no murmur auscultated Lungs-clear to auscultation bilaterally, no wheezing or crackles auscultated Abdomen-soft, nontender, no  organomegaly Extremities-no edema in the lower extremities Neuro-alert, oriented x3, no focal deficit noted            [name] S Eulla Kochanowski   [facility] If 7PM-7AM, please contact night-coverage at [email or web address], Office  [phone]   02/26/2024, 10:58 AM   LOS: 15 days

## 2024-02-26 NOTE — Plan of Care (Signed)
" °  Problem: Education: Goal: Knowledge of General Education information will improve Description: Including pain rating scale, medication(s)/side effects and non-pharmacologic comfort measures 02/26/2024 1623 by Janit Salines, LPN Outcome: Progressing 02/26/2024 1623 by Janit Salines, LPN Outcome: Progressing   Problem: Health Behavior/Discharge Planning: Goal: Ability to manage health-related needs will improve 02/26/2024 1623 by Janit Salines, LPN Outcome: Progressing 02/26/2024 1623 by Janit Salines, LPN Outcome: Progressing   Problem: Clinical Measurements: Goal: Ability to maintain clinical measurements within normal limits will improve 02/26/2024 1623 by Janit Salines, LPN Outcome: Progressing 02/26/2024 1623 by Janit Salines, LPN Outcome: Progressing Goal: Will remain free from infection 02/26/2024 1623 by Janit Salines, LPN Outcome: Progressing 02/26/2024 1623 by Janit Salines, LPN Outcome: Progressing Goal: Diagnostic test results will improve 02/26/2024 1623 by Janit Salines, LPN Outcome: Progressing 02/26/2024 1623 by Janit Salines, LPN Outcome: Progressing Goal: Respiratory complications will improve 02/26/2024 1623 by Janit Salines, LPN Outcome: Progressing 02/26/2024 1623 by Janit Salines, LPN Outcome: Progressing Goal: Cardiovascular complication will be avoided 02/26/2024 1623 by Janit Salines, LPN Outcome: Progressing 02/26/2024 1623 by Janit Salines, LPN Outcome: Progressing   "

## 2024-02-26 NOTE — Progress Notes (Signed)
 Received patient in bed to unit.  Alert and oriented.  Informed consent signed and in chart.   TX duration:3:30  Patient tolerated well.  Transported back to the room  Alert, without acute distress.  Hand-off given to patient's nurse.   Access used: AVF Access issues: NA  Total UF removed: 1.5L Medication(s) given: OXY,vanc Post HD weight: 86.1kg   02/26/24 1237  Vitals  BP 112/74  MAP (mmHg) 85  Pulse Rate (!) 103  ECG Heart Rate (!) 103  Resp 17  Weight 86.1 kg  Type of Weight Post-Dialysis  Oxygen Therapy  SpO2 99 %  O2 Device Room Air  During Treatment Monitoring  Blood Flow Rate (mL/min) 400 mL/min  Arterial Pressure (mmHg) -163.22 mmHg  Venous Pressure (mmHg) 230.29 mmHg  TMP (mmHg) 8.08 mmHg  Ultrafiltration Rate (mL/min) 622 mL/min  Dialysate Flow Rate (mL/min) 300 ml/min  Duration of HD Treatment -hour(s) 3.5 hour(s)  Cumulative Fluid Removed (mL) per Treatment  1500.14  HD Safety Checks Performed Yes  Intra-Hemodialysis Comments Tx completed  Post Treatment  Dialyzer Clearance Lightly streaked  Hemodialysis Intake (mL) 120 mL  Liters Processed 84  Fluid Removed (mL) 1500 mL  Tolerated HD Treatment Yes  AVG/AVF Arterial Site Held (minutes) 5 minutes  AVG/AVF Venous Site Held (minutes) 5 minutes  Fistula / Graft Right Forearm  No placement date or time found.   Orientation: Right  Access Location: Forearm  Site Condition No complications  Fistula / Graft Assessment Present;Thrill;Bruit  Status Deaccessed  Needle Size 15  Drainage Description None      [facility]

## 2024-02-26 NOTE — Progress Notes (Signed)
 Physical Therapy Treatment Patient Details Name: Carl Seaman Sr. MRN: [id number] DOB: [birth year] Today's Date: 02/26/2024   History of Present Illness Pt is a 64 y.o. male who presented 02/07/24 with progressive R-sided weakness and pain along with L ear pain and drainage. Pt septic with L acute otitis media. MRI brain showed no acute findings. MRI cervical spine demonstrated no interval change from prior surgery and no clear etiology for current weakness. Imaging did note prior left-sided strokes, without evidence of acute stroke or recrudescence. Neurosurgery consulted and recommends no acute intervention. R weakness suspected recrudescence of prior symptoms in setting of severe infection. Underwent R knee I&D on 02/21/24 for septic arthritis.  PMH includes: recurrent GIB, epilepsy, PRES, TIA, HTN, renal cell carcinoma, CVA, OSA, ESRD, DM II, Ischemic colitis, anterior cervical corpectomy C4 and C3-C5 fusion (Dr. [name], 05/25/2023), PAD s/p angioplasty/stenting right SFA and popliteal arteries 12/09/2023 for critical limb ischemia.    PT Comments  Patient reports fatigue as just recently back from dialysis and frustration with not getting both antibiotics while in HD and with his brother about wanting to borrow his truck.  Declined EOB and standing attempts though eager to continue LE strengthening so performed and reminded him of exercises he can perform on his own.  Helped to reposition for best R knee positioning and pt performed L single limb bridge and R quad set with hip adductor squeeze.  PT will continue to follow.  Remains stable for inpatient rehab (>3 hours/day) at d/c.     If plan is discharge home, recommend the following: Two people to help with walking and/or transfers;Two people to help with bathing/dressing/bathroom;Assistance with cooking/housework;Direct supervision/assist for medications management;Direct supervision/assist for financial management;Assist for transportation;Help with  stairs or ramp for entrance   Can travel by private vehicle        Equipment Recommendations  BSC/3in1    Recommendations for Other Services       Precautions / Restrictions Precautions Precautions: Fall Precaution/Restrictions Comments: watch HR, R knee buckles Restrictions RLE Weight Bearing Per Provider Order: Weight bearing as tolerated Other Position/Activity Restrictions: no weight bearing restrictions     Mobility  Bed Mobility Overal bed mobility: Needs Assistance             General bed mobility comments: scooting up in bed using head board and bed in trendelenberg with cues and S. declined up to EOB today as just recently back from HD and frustrated with brother about wanting to borrow his truck    Transfers                        Ambulation/Gait                   Stairs             Wheelchair Mobility     Tilt Bed    Modified Rankin (Stroke Patients Only)       Balance                                            Communication Communication Communication: No apparent difficulties  Cognition Arousal: Alert Behavior During Therapy: WFL for tasks assessed/performed   PT - Cognitive impairments: No apparent impairments  Following commands: Intact      Cueing Cueing Techniques: Verbal cues, Tactile cues, Visual cues  Exercises General Exercises - Lower Extremity Quad Sets: AROM, Right, Supine, 5 reps (with hip adductor squeeze and 5 sec hold) Low Level/ICU Exercises Stabilized Bridging: Strengthening, Left, 5 reps, Supine (single limb bridge)    General Comments General comments (skin integrity, edema, etc.): pt asking for R knee dressing change (states not changed since surgery last Fri); RN made aware      Pertinent Vitals/Pain Pain Assessment Pain Assessment: Faces Faces Pain Scale: Hurts little more Pain Location: R knee Pain Descriptors / Indicators:  Aching, Discomfort, Tender Pain Intervention(s): Monitored during session, Repositioned    Home Living                          Prior Function            PT Goals (current goals can now be found in the care plan section) Progress towards PT goals: Not progressing toward goals - comment    Frequency    Min 3X/week      PT Plan      Co-evaluation              AM-PAC PT 6 Clicks Mobility   Outcome Measure  Help needed turning from your back to your side while in a flat bed without using bedrails?: A Little Help needed moving from lying on your back to sitting on the side of a flat bed without using bedrails?: A Lot Help needed moving to and from a bed to a chair (including a wheelchair)?: Total Help needed standing up from a chair using your arms (e.g., wheelchair or bedside chair)?: Total Help needed to walk in hospital room?: Total Help needed climbing 3-5 steps with a railing? : Total 6 Click Score: 9    End of Session   Activity Tolerance: Patient limited by fatigue Patient left: in bed   PT Visit Diagnosis: Muscle weakness (generalized) (M62.81);Pain;Difficulty in walking, not elsewhere classified (R26.2) Pain - Right/Left: Right Pain - part of body: Knee     Time: 8494-8482 PT Time Calculation (min) (ACUTE ONLY): 12 min  Charges:    $Therapeutic Exercise: 8-22 mins PT General Charges $$ ACUTE PT VISIT: 1 Visit                     Micheline Portal, PT Acute Rehabilitation Services [phone] 02/26/2024    Montie Portal 02/26/2024, 5:51 PM

## 2024-02-26 NOTE — Procedures (Signed)
 I was present at this dialysis session. I have reviewed the session itself and made appropriate changes.  Unable to do HD in recliner for now  [facility] Weights   02/24/24 1137 02/26/24 0500 02/26/24 0851  Weight: 85.4 kg 85.4 kg 88.7 kg    Recent Labs  Lab 02/26/24 0304  NA 133*  K 5.0  CL 89*  CO2 27  GLUCOSE 116*  BUN 60*  CREATININE 8.43*  CALCIUM  8.2*  PHOS 5.5*    Recent Labs  Lab 02/21/24 0505 02/21/24 1237 02/23/24 1053 02/24/24 0757  WBC 7.2  --  9.9 9.5  HGB 10.4* 9.5* 7.9* 8.1*  HCT 33.5* 28.0* 24.7* 25.3*  MCV 93.8  --  90.8 91.0  PLT 517*  --  474* 478*    Scheduled Meds:  (feeding supplement) PROSource Plus  30 mL Oral BID BM   aspirin  EC  81 mg Oral Daily   atorvastatin   10 mg Oral Daily   bupivacaine (PF)  10 mL Infiltration Once   Chlorhexidine  Gluconate Cloth  6 each Topical Q0600   Chlorhexidine  Gluconate Cloth  6 each Topical Q0600   cinacalcet   120 mg Oral Q breakfast   clopidogrel   75 mg Oral Daily   darbepoetin (ARANESP ) injection - DIALYSIS  40 mcg Subcutaneous Q Fri-1800   feeding supplement (KATE FARMS STANDARD 1.4) Liquid  325 mL Oral Daily   ferrous sulfate   325 mg Oral Q breakfast   folic acid   1 mg Oral Daily   gabapentin   300 mg Oral QHS   insulin  aspart  0-6 Units Subcutaneous TID WC   lidocaine   1 patch Transdermal Q24H   lidocaine   1 patch Transdermal Q24H   midodrine   10 mg Oral TID WC   multivitamin  1 tablet Oral QHS   pantoprazole   40 mg Oral BID AC   psyllium  1 packet Oral Daily   sevelamer  carbonate  2,400 mg Oral TID WC   sodium chloride  flush  3 mL Intravenous Q12H   Continuous Infusions:  ceFEPime  (MAXIPIME ) IV Stopped (02/24/24 1852)   vancomycin      PRN Meds:.acetaminophen  **OR** acetaminophen , cyclobenzaprine , diphenhydrAMINE , melatonin, midodrine , oxyCODONE -acetaminophen    Ephriam Stank, MD [facility] 02/26/2024, 9:51 AM

## 2024-02-26 NOTE — Progress Notes (Signed)
" ° °  Inpatient Rehabilitation Admissions Coordinator   I await appeal determination for eventual possible [facility] admit. Noted ID recommendations for further ortho follow up.  Heron Leavell, RN, MSN Rehab Admissions Coordinator [phone] 02/26/2024 4:24 PM  "

## 2024-02-26 NOTE — Progress Notes (Signed)
 VAST consult. Arrived to unit to place PIV. Patient declined piv and this RN Catering Manager. Powell Bowler, RN VAST

## 2024-02-26 NOTE — Progress Notes (Signed)
 " [name] Progress Note    Assessment/ Plan:   ESRD: on HD MWF. Pt requests 1st shift. Will hold off on recliner HD for Friday  Volume/chronic hypotension: HD with midodrine  support, UF as tolerated  Anemia of ESRD: cont esa darbe 40mcg sq weekly, declines IV alb/ prbc's   Hyperkalemia: persistent, mild, had been on lokelma --on hold for now, can resume if needed  Septic arthritis R knee and right shoulder: SP I&D 1/09 per ortho, refused IV so getting po abx--per primary. S/p fluid collection of right shoulder 1/13, highly infectious. ID following, likely needs ortho for right shoulder washout  Otitis media w/ TM perforation: s/p course of abx, ENT has seen  R sided weakness: main c/o on admission, CVA w/u was negative  Secondary hyperparathyroidism: phos at goal  Dialysis Orders: MWF NW 4h  B400  87.3kg  2K bath  AVF   Heparin  3000 ESA: Mircera 50 q 2 wks (last 12/15) BMM: Calcitriol  2 mcg PO q HD, cinacalcet  120 mg daily   Subjective:   Patient seen and examined on dialysis. Unable to get in recliner today, unable to bear weight on right leg as of now, almost fell as per patient. Therefore HD in bed today   Objective:   BP (!) 159/82   Pulse 88   Temp 99 F (37.2 C) (Oral)   Resp 16   Ht 6' 1 (1.854 m)   Wt 88.7 kg   SpO2 97%   BMI 25.80 kg/m   Intake/Output Summary (Last 24 hours) at 02/26/2024 1001 Last data filed at 02/26/2024 0300 Gross per 24 hour  Intake 360 ml  Output --  Net 360 ml   Weight change: -0.4 kg  Physical Exam: Gen: NAD CVS: RRR Resp: unlabored, normal wob Abd: soft Ext: right knee dressing in place, trace right ankle edema, no edema LLE Neuro: awake, alert Dialysis access: RUE AVF  Imaging: IR ARTHRO ASP OR INJ MAJOR JOINT OR BURSA Result Date: 02/25/2024 INDICATION: 64 year old with a septic right knee and right shoulder symptoms. Previous MRI demonstrated a fluid collection along the posterior soft tissues of the  right shoulder. Patient is status post right shoulder arthroplasty. EXAM: Ultrasound-guided aspiration of fluid collections around the right shoulder MEDICATIONS: 1% lidocaine  for local anesthetic ANESTHESIA/SEDATION: None COMPLICATIONS: None immediate. PROCEDURE: Informed written consent was obtained from the patient after a thorough discussion of the procedural risks, benefits and alternatives. All questions were addressed. A timeout was performed prior to the initiation of the procedure. Right shoulder was evaluated with ultrasound. Small amount of complex fluid in the deep soft tissues along the posterior and anterior aspect of the right shoulder was identified with ultrasound. The posterior collection appeared to correspond with the MRI from 02/01/2024. Anterior and posterior aspect of the right shoulder was prepped with chlorhexidine  and sterile field was created. Skin was anesthetized with 1% lidocaine . Using ultrasound guidance, a 22 gauge needle was directed into the posterior collection and a small amount of brownish fluid was aspirated. Using ultrasound guidance, an 18 gauge needle was directed into the anterior right shoulder and a small amount of yellow fluid was aspirated. The fluid was sent for culture, crystals and cell count. FINDINGS: Complex hypoechoic fluid along the posterior right shoulder corresponding with the previous MRI findings. 1 mL brown fluid was aspirated from this area. Small amount of fluid and edematous tissue along the anterior right shoulder. 1 mL of yellow fluid was aspirated from the anterior right shoulder. IMPRESSION:  Ultrasound-guided aspiration of a small amount of fluid from the anterior and posterior right shoulder soft tissues. Electronically Signed   By: Juliene Balder M.D.   On: 02/25/2024 16:56    Labs: BMET Recent Labs  Lab 02/20/24 0332 02/21/24 0505 02/21/24 1237 02/22/24 0534 02/23/24 0557 02/24/24 0506 02/25/24 0531 02/26/24 0304  NA 136 133* 134* 133*  135 134* 136 133*  K 4.8 5.5* 4.1 5.7* 4.6 4.5 4.6 5.0  CL 92* 90* 93* 92* 91* 89* 91* 89*  CO2 29 24  --  26 26 24 28 27   GLUCOSE 76 84 76 105* 83 84 82 116*  BUN 36* 59* 32* 42* 61* 72* 45* 60*  CREATININE 5.62* 7.62* 4.50* 5.37* 7.43* 9.23* 6.63* 8.43*  CALCIUM  9.4 8.9  --  8.9 8.6* 8.3* 8.7* 8.2*  PHOS 4.5 5.7*  --  5.4* 6.5* 6.7* 4.7* 5.5*   CBC Recent Labs  Lab 02/21/24 0505 02/21/24 1237 02/23/24 1053 02/24/24 0757  WBC 7.2  --  9.9 9.5  HGB 10.4* 9.5* 7.9* 8.1*  HCT 33.5* 28.0* 24.7* 25.3*  MCV 93.8  --  90.8 91.0  PLT 517*  --  474* 478*    Medications:     (feeding supplement) PROSource Plus  30 mL Oral BID BM   aspirin  EC  81 mg Oral Daily   atorvastatin   10 mg Oral Daily   bupivacaine (PF)  10 mL Infiltration Once   Chlorhexidine  Gluconate Cloth  6 each Topical Q0600   Chlorhexidine  Gluconate Cloth  6 each Topical Q0600   cinacalcet   120 mg Oral Q breakfast   clopidogrel   75 mg Oral Daily   darbepoetin (ARANESP ) injection - DIALYSIS  40 mcg Subcutaneous Q Fri-1800   feeding supplement (KATE FARMS STANDARD 1.4) Liquid  325 mL Oral Daily   ferrous sulfate   325 mg Oral Q breakfast   folic acid   1 mg Oral Daily   gabapentin   300 mg Oral QHS   insulin  aspart  0-6 Units Subcutaneous TID WC   lidocaine   1 patch Transdermal Q24H   lidocaine   1 patch Transdermal Q24H   midodrine   10 mg Oral TID WC   multivitamin  1 tablet Oral QHS   pantoprazole   40 mg Oral BID AC   psyllium  1 packet Oral Daily   sevelamer  carbonate  2,400 mg Oral TID WC   sodium chloride  flush  3 mL Intravenous Q12H      Ephriam Stank, MD [name] 02/26/2024, 10:01 AM   "

## 2024-02-27 ENCOUNTER — Ambulatory Visit: Admitting: Physical Therapy

## 2024-02-27 DIAGNOSIS — R29898 Other symptoms and signs involving the musculoskeletal system: Secondary | ICD-10-CM | POA: Diagnosis not present

## 2024-02-27 DIAGNOSIS — H66012 Acute suppurative otitis media with spontaneous rupture of ear drum, left ear: Secondary | ICD-10-CM | POA: Diagnosis not present

## 2024-02-27 DIAGNOSIS — I10 Essential (primary) hypertension: Secondary | ICD-10-CM | POA: Diagnosis not present

## 2024-02-27 DIAGNOSIS — M009 Pyogenic arthritis, unspecified: Secondary | ICD-10-CM | POA: Diagnosis not present

## 2024-02-27 LAB — COMPREHENSIVE METABOLIC PANEL WITH GFR
ALT: <5 U/L (ref 0–44)
AST: 18 U/L (ref 15–41)
Albumin: 3 g/dL — ABNORMAL LOW (ref 3.5–5.0)
Alkaline Phosphatase: 98 U/L (ref 38–126)
Anion gap: 14 (ref 5–15)
BUN: 33 mg/dL — ABNORMAL HIGH (ref 8–23)
CO2: 28 mmol/L (ref 22–32)
Calcium: 8.6 mg/dL — ABNORMAL LOW (ref 8.9–10.3)
Chloride: 91 mmol/L — ABNORMAL LOW (ref 98–111)
Creatinine, Ser: 5.8 mg/dL — ABNORMAL HIGH (ref 0.61–1.24)
GFR, Estimated: 10 mL/min — ABNORMAL LOW
Glucose, Bld: 87 mg/dL (ref 70–99)
Potassium: 4.2 mmol/L (ref 3.5–5.1)
Sodium: 133 mmol/L — ABNORMAL LOW (ref 135–145)
Total Bilirubin: 0.4 mg/dL (ref 0.0–1.2)
Total Protein: 7.8 g/dL (ref 6.5–8.1)

## 2024-02-27 LAB — CBC
HCT: 24.3 % — ABNORMAL LOW (ref 39.0–52.0)
Hemoglobin: 7.8 g/dL — ABNORMAL LOW (ref 13.0–17.0)
MCH: 29.2 pg (ref 26.0–34.0)
MCHC: 32.1 g/dL (ref 30.0–36.0)
MCV: 91 fL (ref 80.0–100.0)
Platelets: 335 K/uL (ref 150–400)
RBC: 2.67 MIL/uL — ABNORMAL LOW (ref 4.22–5.81)
RDW: 15.5 % (ref 11.5–15.5)
WBC: 6.2 K/uL (ref 4.0–10.5)
nRBC: 0 % (ref 0.0–0.2)

## 2024-02-27 LAB — PHOSPHORUS: Phosphorus: 4.3 mg/dL (ref 2.5–4.6)

## 2024-02-27 LAB — GLUCOSE, CAPILLARY
Glucose-Capillary: 74 mg/dL (ref 70–99)
Glucose-Capillary: 123 mg/dL — ABNORMAL HIGH (ref 70–99)
Glucose-Capillary: 132 mg/dL — ABNORMAL HIGH (ref 70–99)
Glucose-Capillary: 127 mg/dL — ABNORMAL HIGH (ref 70–99)

## 2024-02-27 MED ORDER — POVIDONE-IODINE 10 % EX SWAB: 2.0000 | Freq: Once | CUTANEOUS | Status: DC

## 2024-02-27 MED ORDER — TRANEXAMIC ACID-NACL 1000-0.7 MG/100ML-% IV SOLN: 1000.0000 mg | INTRAVENOUS | Status: DC

## 2024-02-27 MED ORDER — CEFAZOLIN SODIUM-DEXTROSE 2-4 GM/100ML-% IV SOLN: 2.0000 g | INTRAVENOUS | Status: DC

## 2024-02-27 MED ORDER — DARBEPOETIN ALFA 100 MCG/0.5ML IJ SOSY
100.0000 ug | PREFILLED_SYRINGE | INTRAMUSCULAR | Status: DC
  Filled 2024-02-27: qty 0.5

## 2024-02-27 MED ORDER — CHLORHEXIDINE GLUCONATE 4 % EX SOLN: 60.0000 mL | Freq: Once | CUTANEOUS | Status: DC

## 2024-02-27 NOTE — Progress Notes (Signed)
" ° ° °  PROCEDURAL EXPEDITER PROGRESS NOTE  Patient Name: Carl Loconte Sr.  DOB:[birth year] Date of Admission: 02/08/2024  Date of Assessment:02/27/24   -------------------------------------------------------------------------------------------------------------------   Brief clinical summary: Pt to OR today for R shoulder implant removal with placement of antibiotic spacer  Orders in place:  Yes   Labs, test, and orders reviewed: Y - Place PCR per surgeon's orders  Requires surgical clearance:  No   Barriers noted: N/A  -------------------------------------------------------------------------------------------------------------------  [employer], [place], [place] Please contact us  directly via secure chat (search for [facility]) or by calling us  at [phone] [employer]).  "

## 2024-02-27 NOTE — Progress Notes (Signed)
 " [facility] Progress Note    Assessment/ Plan:   ESRD: on HD MWF. Pt requests 1st shift. Will hold off on recliner HD for Friday  Volume/chronic hypotension: HD with midodrine  support, UF as tolerated  Anemia of ESRD: declines IV alb/ prbc's, hold off on IV Fe, increasing ESA dose (hgb 7.8)  Hyperkalemia: persistent, mild, had been on lokelma --on hold for now, can resume if needed, k wnl  Septic arthritis R knee and right shoulder: SP I&D 1/09 per ortho, refused IV so getting po abx--per primary. S/p fluid collection of right shoulder 1/13, highly infectious. ID following, going to OR today for right shoulder implant removal with abx spacer placement  Otitis media w/ TM perforation: s/p course of abx, ENT has seen  R sided weakness: main c/o on admission, CVA w/u was negative  Secondary hyperparathyroidism: phos at goal  Dialysis Orders: MWF NW 4h  B400  87.3kg  2K bath  AVF   Heparin  3000 ESA: Mircera 50 q 2 wks (last 12/15) BMM: Calcitriol  2 mcg PO q HD, cinacalcet  120 mg daily   Subjective:   Patient seen and examined bedside, no complaints. Tolerated HD (in bed) yday with net uf 1.5L Going to OR today Seems that he didn't get his abx with HD yesterday, discussed with staff. Refuses PIV.   Objective:   BP 106/68   Pulse 91   Temp 98.2 F (36.8 C)   Resp 17   Ht 6' 1 (1.854 m)   Wt 82.9 kg   SpO2 100%   BMI 24.11 kg/m   Intake/Output Summary (Last 24 hours) at 02/27/2024 9170 Last data filed at 02/26/2024 1602 Gross per 24 hour  Intake 200.12 ml  Output 1500 ml  Net -1299.88 ml   Weight change: 3.3 kg  Physical Exam: Gen: NAD CVS: RRR Resp: unlabored, normal wob Abd: soft Ext: right knee dressing in place, no edema LE's Neuro: awake, alert Dialysis access: RUE AVF  Imaging: IR ARTHRO ASP OR INJ MAJOR JOINT OR BURSA Result Date: 02/25/2024 INDICATION: 64 year old with a septic right knee and right shoulder symptoms. Previous MRI  demonstrated a fluid collection along the posterior soft tissues of the right shoulder. Patient is status post right shoulder arthroplasty. EXAM: Ultrasound-guided aspiration of fluid collections around the right shoulder MEDICATIONS: 1% lidocaine  for local anesthetic ANESTHESIA/SEDATION: None COMPLICATIONS: None immediate. PROCEDURE: Informed written consent was obtained from the patient after a thorough discussion of the procedural risks, benefits and alternatives. All questions were addressed. A timeout was performed prior to the initiation of the procedure. Right shoulder was evaluated with ultrasound. Small amount of complex fluid in the deep soft tissues along the posterior and anterior aspect of the right shoulder was identified with ultrasound. The posterior collection appeared to correspond with the MRI from 02/01/2024. Anterior and posterior aspect of the right shoulder was prepped with chlorhexidine  and sterile field was created. Skin was anesthetized with 1% lidocaine . Using ultrasound guidance, a 22 gauge needle was directed into the posterior collection and a small amount of brownish fluid was aspirated. Using ultrasound guidance, an 18 gauge needle was directed into the anterior right shoulder and a small amount of yellow fluid was aspirated. The fluid was sent for culture, crystals and cell count. FINDINGS: Complex hypoechoic fluid along the posterior right shoulder corresponding with the previous MRI findings. 1 mL brown fluid was aspirated from this area. Small amount of fluid and edematous tissue along the anterior right shoulder. 1 mL of yellow  fluid was aspirated from the anterior right shoulder. IMPRESSION: Ultrasound-guided aspiration of a small amount of fluid from the anterior and posterior right shoulder soft tissues. Electronically Signed   By: Juliene Balder M.D.   On: 02/25/2024 16:56    Labs: BMET Recent Labs  Lab 02/21/24 0505 02/21/24 1237 02/22/24 0534 02/23/24 0557  02/24/24 0506 02/25/24 0531 02/26/24 0304 02/27/24 0301  NA 133* 134* 133* 135 134* 136 133* 133*  K 5.5* 4.1 5.7* 4.6 4.5 4.6 5.0 4.2  CL 90* 93* 92* 91* 89* 91* 89* 91*  CO2 24  --  26 26 24 28 27 28   GLUCOSE 84 76 105* 83 84 82 116* 87  BUN 59* 32* 42* 61* 72* 45* 60* 33*  CREATININE 7.62* 4.50* 5.37* 7.43* 9.23* 6.63* 8.43* 5.80*  CALCIUM  8.9  --  8.9 8.6* 8.3* 8.7* 8.2* 8.6*  PHOS 5.7*  --  5.4* 6.5* 6.7* 4.7* 5.5* 4.3   CBC Recent Labs  Lab 02/21/24 0505 02/21/24 1237 02/23/24 1053 02/24/24 0757 02/27/24 0301  WBC 7.2  --  9.9 9.5 6.2  HGB 10.4* 9.5* 7.9* 8.1* 7.8*  HCT 33.5* 28.0* 24.7* 25.3* 24.3*  MCV 93.8  --  90.8 91.0 91.0  PLT 517*  --  474* 478* 335    Medications:     (feeding supplement) PROSource Plus  30 mL Oral BID BM   aspirin  EC  81 mg Oral Daily   atorvastatin   10 mg Oral Daily   bupivacaine (PF)  10 mL Infiltration Once   chlorhexidine   60 mL Topical Once   Chlorhexidine  Gluconate Cloth  6 each Topical Q0600   Chlorhexidine  Gluconate Cloth  6 each Topical Q0600   cinacalcet   120 mg Oral Q breakfast   clopidogrel   75 mg Oral Daily   darbepoetin (ARANESP ) injection - DIALYSIS  40 mcg Subcutaneous Q Fri-1800   feeding supplement (KATE FARMS STANDARD 1.4) Liquid  325 mL Oral Daily   ferrous sulfate   325 mg Oral Q breakfast   folic acid   1 mg Oral Daily   gabapentin   300 mg Oral QHS   heparin  injection (subcutaneous)  5,000 Units Subcutaneous Q8H   insulin  aspart  0-6 Units Subcutaneous TID WC   lidocaine   1 patch Transdermal Q24H   lidocaine   1 patch Transdermal Q24H   midodrine   10 mg Oral TID WC   multivitamin  1 tablet Oral QHS   pantoprazole   40 mg Oral BID AC   povidone-iodine   2 Application Topical Once   psyllium  1 packet Oral Daily   sevelamer  carbonate  2,400 mg Oral TID WC   sodium chloride  flush  3 mL Intravenous Q12H      Ephriam Stank, MD [facility] 02/27/2024, 8:29 AM   "

## 2024-02-27 NOTE — Plan of Care (Signed)
" °  Problem: Education: Goal: Knowledge of General Education information will improve Description: Including pain rating scale, medication(s)/side effects and non-pharmacologic comfort measures Outcome: Progressing   Problem: Health Behavior/Discharge Planning: Goal: Ability to manage health-related needs will improve Outcome: Progressing   Problem: Clinical Measurements: Goal: Ability to maintain clinical measurements within normal limits will improve Outcome: Progressing Goal: Will remain free from infection Outcome: Progressing Goal: Diagnostic test results will improve Outcome: Progressing Goal: Respiratory complications will improve Outcome: Progressing Goal: Cardiovascular complication will be avoided Outcome: Progressing   Problem: Activity: Goal: Risk for activity intolerance will decrease Outcome: Progressing   Problem: Nutrition: Goal: Adequate nutrition will be maintained Outcome: Progressing   Problem: Coping: Goal: Level of anxiety will decrease Outcome: Progressing   Problem: Elimination: Goal: Will not experience complications related to bowel motility Outcome: Progressing Goal: Will not experience complications related to urinary retention Outcome: Progressing   Problem: Pain Managment: Goal: General experience of comfort will improve and/or be controlled Outcome: Progressing   Problem: Safety: Goal: Ability to remain free from injury will improve Outcome: Progressing   Problem: Skin Integrity: Goal: Risk for impaired skin integrity will decrease Outcome: Progressing   Problem: Education: Goal: Ability to describe self-care measures that may prevent or decrease complications (Diabetes Survival Skills Education) will improve Outcome: Progressing Goal: Individualized Educational Video(s) Outcome: Progressing   Problem: Coping: Goal: Ability to adjust to condition or change in health will improve Outcome: Progressing   Problem: Fluid  Volume: Goal: Ability to maintain a balanced intake and output will improve Outcome: Progressing   Problem: Health Behavior/Discharge Planning: Goal: Ability to identify and utilize available resources and services will improve Outcome: Progressing Goal: Ability to manage health-related needs will improve Outcome: Progressing   Problem: Metabolic: Goal: Ability to maintain appropriate glucose levels will improve Outcome: Progressing   Problem: Nutritional: Goal: Maintenance of adequate nutrition will improve Outcome: Progressing Goal: Progress toward achieving an optimal weight will improve Outcome: Progressing   Problem: Skin Integrity: Goal: Risk for impaired skin integrity will decrease Outcome: Progressing   Problem: Tissue Perfusion: Goal: Adequacy of tissue perfusion will improve Outcome: Progressing   Problem: Education: Goal: Knowledge of the prescribed therapeutic regimen will improve Outcome: Progressing   Problem: Bowel/Gastric: Goal: Gastrointestinal status for postoperative course will improve Outcome: Progressing   Problem: Cardiac: Goal: Ability to maintain an adequate cardiac output Outcome: Progressing Goal: Will show no evidence of cardiac arrhythmias Outcome: Progressing   Problem: Nutritional: Goal: Will attain and maintain optimal nutritional status Outcome: Progressing   Problem: Neurological: Goal: Will regain or maintain usual level of consciousness Outcome: Progressing   Problem: Clinical Measurements: Goal: Ability to maintain clinical measurements within normal limits Outcome: Progressing Goal: Postoperative complications will be avoided or minimized Outcome: Progressing   Problem: Respiratory: Goal: Will regain and/or maintain adequate ventilation Outcome: Progressing Goal: Respiratory status will improve Outcome: Progressing   Problem: Skin Integrity: Goal: Demonstrates signs of wound healing without infection Outcome:  Progressing   Problem: Urinary Elimination: Goal: Will remain free from infection Outcome: Progressing Goal: Ability to achieve and maintain adequate urine output Outcome: Progressing   "

## 2024-02-27 NOTE — Progress Notes (Signed)
" ° °  Inpatient Rehabilitation Admissions Coordinator   Noted plans for OR for right shoulder implant removal and abx spacer placement. Postop weight bearing restrictions will assist in determining rehab venue options postop. Discussed with Dr Cornelio and Dr Babs. We will follow up postoperatively.  Heron Leavell, RN, MSN Rehab Admissions Coordinator [phone] 02/27/2024 11:07 AM  "

## 2024-02-27 NOTE — Progress Notes (Signed)
 Occupational Therapy Treatment Patient Details Name: Carl Karstens Sr. MRN: [id number] DOB: August 22, 1960 Today's Date: 02/27/2024   History of present illness Pt is a 64 y.o. male who presented 02/07/24 with progressive R-sided weakness and pain along with L ear pain and drainage. Pt septic with L acute otitis media. MRI brain showed no acute findings. MRI cervical spine demonstrated no interval change from prior surgery and no clear etiology for current weakness. Imaging did note prior left-sided strokes, without evidence of acute stroke or recrudescence. Neurosurgery consulted and recommends no acute intervention. R weakness suspected recrudescence of prior symptoms in setting of severe infection. Underwent R knee I&D on 02/21/24 for septic arthritis.  PMH includes: recurrent GIB, epilepsy, PRES, TIA, HTN, renal cell carcinoma, CVA, OSA, ESRD, DM II, Ischemic colitis, anterior cervical corpectomy C4 and C3-C5 fusion (Dr. [name], 05/25/2023), PAD s/p angioplasty/stenting right SFA and popliteal arteries 12/09/2023 for critical limb ischemia.   OT comments  Patient received in supine and eager to participate. Patient able to get to EOB with min assist due to assistance with RLE and demonstrated good sitting balance. Patient able to perform grooming and BUE strengthening exercises seated on EOB with no LOB. Patient with limited BUE shoulder ROM.  Patient will benefit from intensive inpatient follow-up therapy, >3 hours/day.  Acute OT to continue to follow to address established goals to facilitate DC to next venue of care.        If plan is discharge home, recommend the following:  Two people to help with walking and/or transfers;Two people to help with bathing/dressing/bathroom;Assistance with cooking/housework;Help with stairs or ramp for entrance;Assist for transportation   Equipment Recommendations  Other (comment) (defer)    Recommendations for Other Services      Precautions / Restrictions  Precautions Precautions: Fall Recall of Precautions/Restrictions: Impaired Precaution/Restrictions Comments: watch HR, R knee buckles Restrictions Other Position/Activity Restrictions: no weight bearing restrictions       Mobility Bed Mobility Overal bed mobility: Needs Assistance Bed Mobility: Supine to Sit     Supine to sit: Min assist, HOB elevated, Used rails     General bed mobility comments: assistance with RLE, remained on EOB at end of session    Transfers                         Balance Overall balance assessment: Needs assistance Sitting-balance support: Feet supported Sitting balance-Leahy Scale: Good Sitting balance - Comments: EOB                                   ADL either performed or assessed with clinical judgement   ADL Overall ADL's : Needs assistance/impaired     Grooming: Wash/dry hands;Wash/dry face;Oral care;Set up;Supervision/safety;Sitting Grooming Details (indicate cue type and reason): on EOB                                    Extremity/Trunk Assessment              Vision       Perception     Praxis     Communication Communication Communication: No apparent difficulties   Cognition Arousal: Alert Behavior During Therapy: WFL for tasks assessed/performed Cognition: No apparent impairments  Following commands: Intact        Cueing   Cueing Techniques: Verbal cues, Tactile cues, Visual cues  Exercises Exercises: General Upper Extremity General Exercises - Upper Extremity Elbow Flexion: Strengthening, Both, 10 reps, Seated Elbow Extension: Strengthening, Both, 10 reps, Seated    Shoulder Instructions       General Comments asked to remain on EOB at end of session, nursing aware    Pertinent Vitals/ Pain       Pain Assessment Pain Assessment: Faces Faces Pain Scale: Hurts little more Pain Location: R knee Pain Descriptors /  Indicators: Aching, Discomfort, Tender Pain Intervention(s): Limited activity within patient's tolerance, Monitored during session, Repositioned  Home Living                                          Prior Functioning/Environment              Frequency  Min 2X/week        Progress Toward Goals  OT Goals(current goals can now be found in the care plan section)  Progress towards OT goals: Progressing toward goals     Plan      Co-evaluation                 AM-PAC OT 6 Clicks Daily Activity     Outcome Measure   Help from another person eating meals?: A Little Help from another person taking care of personal grooming?: A Little Help from another person toileting, which includes using toliet, bedpan, or urinal?: A Lot Help from another person bathing (including washing, rinsing, drying)?: A Lot Help from another person to put on and taking off regular upper body clothing?: A Little Help from another person to put on and taking off regular lower body clothing?: A Lot 6 Click Score: 15    End of Session    OT Visit Diagnosis: Unsteadiness on feet (R26.81);Muscle weakness (generalized) (M62.81);History of falling (Z91.81);Pain Pain - Right/Left: Right Pain - part of body: Knee   Activity Tolerance Patient tolerated treatment well   Patient Left in bed;with call bell/phone within reach;with bed alarm set (on EOB)   Nurse Communication Mobility status;Other (comment) (patient left seated on EOB)        Time: 9288-9261 OT Time Calculation (min): 27 min  Charges: OT General Charges $OT Visit: 1 Visit OT Treatments $Self Care/Home Management : 8-22 mins $Therapeutic Exercise: 8-22 mins  Dick Laine, OTA Acute Rehabilitation Services  Office [phone]   Jeb LITTIE Laine 02/27/2024, 11:05 AM

## 2024-02-27 NOTE — Consult Note (Signed)
 Reason for Consult: Right shoulder infection Referring Physician: Dr.Xu  Carl Curls Sr. is an 64 y.o. male.  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  That has been washed out.  Subsequently aspiration of the right shoulder which has a reverse shoulder replacement placed by another physician within 20 years also showed increased white count of the fluid.  The humeral component of the reverse replacement had subsided significantly since its implantation. Patient is on Plavix  and undergoes hemodialysis on Sunday Tuesday Thursday.  Reports significant pain in both knees.  Has a left total hip replacement in position which is asymptomatic.  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    Parathyroid  abnormality    ectopic parathyroid  gland   Presence of arteriovenous fistula for hemodialysis, primary    RUE PER PT RLE   Refusal of blood product    NO WHOLE BLOOD PROUCTS   Renal cell carcinoma (HCC)    s/p hand assisted laparoscopic bilateral nephrectomies 11/29/17, + RCC left   Secondary hyperparathyroidism    Seizures (HCC)    one episode in past, due to elevated Potassium 08/02/20- at least 4 years ago   Sleep apnea    doesn't use CPAP anymore since weight loss   Stroke [facility])    no residual    Past Surgical History:  Procedure Laterality Date   ABDOMINAL AORTOGRAM W/LOWER EXTREMITY N/A  12/09/2023   Procedure: ABDOMINAL AORTOGRAM W/LOWER EXTREMITY;  Surgeon: Sheree Penne Bruckner, MD;  Location: [facility] INVASIVE CV LAB;  Service: Cardiovascular;  Laterality: N/A;   ANTERIOR CERVICAL CORPECTOMY N/A 05/25/2023   Procedure: ANTERIOR CERVICAL CORPECTOMY CERVICAL FOUR, CERVICAL THREE - CERVICAL FIVE FUSION;  Surgeon: [name] Shove, MD;  Location: [facility] OR;  Service: Neurosurgery;  Laterality: N/A;   AV FISTULA PLACEMENT Right    right arm   BIOPSY  08/01/2019   Procedure: BIOPSY;  Surgeon: Kristie Lamprey, MD;  Location: [facility] ENDOSCOPY;  Service: Endoscopy;;   BIOPSY  11/18/2020   Procedure: BIOPSY;  Surgeon: Stacia Glendia BRAVO, MD;  Location: [facility] ENDOSCOPY;  Service: Gastroenterology;;   BIOPSY  09/13/2021   Procedure: BIOPSY;  Surgeon: Wilhelmenia Aloha Raddle., MD;  Location: [facility] ENDOSCOPY;  Service: Gastroenterology;;   CATARACT EXTRACTION W/ INTRAOCULAR LENS  IMPLANT, BILATERAL     COLON SURGERY     COLONOSCOPY N/A 08/04/2015   Procedure: COLONOSCOPY;  Surgeon: Gordy CHRISTELLA Starch, MD;  Location: [facility] ENDOSCOPY;  Service: Endoscopy;  Laterality: N/A;   COLONOSCOPY  2017   JMP@ [facility]-good prep-mass -recall 1 yr   COLONOSCOPY N/A 09/13/2021   Procedure: COLONOSCOPY;  Surgeon: Mansouraty, Aloha Raddle., MD;  Location: [facility] ENDOSCOPY;  Service: Gastroenterology;  Laterality: N/A;   COLONOSCOPY WITH PROPOFOL  N/A 11/25/2020   Procedure:  COLONOSCOPY WITH PROPOFOL ;  Surgeon: Federico Rosario BROCKS, MD;  Location: [facility] ENDOSCOPY;  Service: Gastroenterology;  Laterality: N/A;   COLONOSCOPY WITH PROPOFOL  N/A 09/23/2021   Procedure: COLONOSCOPY WITH PROPOFOL ;  Surgeon: San Sandor GAILS, DO;  Location: [facility] ENDOSCOPY;  Service: Gastroenterology;  Laterality: N/A;   ENTEROSCOPY N/A 09/23/2021   Procedure: ENTEROSCOPY;  Surgeon: San Sandor GAILS, DO;  Location: [facility] ENDOSCOPY;  Service: Gastroenterology;  Laterality: N/A;  Will place order for video capsule study as we may opt to place it during procedure    ESOPHAGOGASTRODUODENOSCOPY N/A 08/01/2019   Procedure: ESOPHAGOGASTRODUODENOSCOPY (EGD);  Surgeon: Kristie Lamprey, MD;  Location: [facility] ENDOSCOPY;  Service: Endoscopy;  Laterality: N/A;   ESOPHAGOGASTRODUODENOSCOPY N/A 11/18/2020   Procedure: ESOPHAGOGASTRODUODENOSCOPY (EGD);  Surgeon: Stacia Glendia BRAVO, MD;  Location: [facility] ENDOSCOPY;  Service: Gastroenterology;  Laterality: N/A;   ESOPHAGOGASTRODUODENOSCOPY (EGD) WITH PROPOFOL  N/A 08/04/2019   Procedure: ESOPHAGOGASTRODUODENOSCOPY (EGD) WITH PROPOFOL ;  Surgeon: Leigh Elspeth SQUIBB, MD;  Location: [facility] ENDOSCOPY;  Service: Gastroenterology;  Laterality: N/A;   ESOPHAGOGASTRODUODENOSCOPY (EGD) WITH PROPOFOL  N/A 09/13/2021   Procedure: ESOPHAGOGASTRODUODENOSCOPY (EGD) WITH PROPOFOL ;  Surgeon: Wilhelmenia Aloha Raddle., MD;  Location: [facility] ENDOSCOPY;  Service: Gastroenterology;  Laterality: N/A;   GIVENS CAPSULE STUDY N/A 09/23/2021   Procedure: GIVENS CAPSULE STUDY;  Surgeon: San Sandor GAILS, DO;  Location[facility] ENDOSCOPY;  Service: Gastroenterology;  Laterality: N/A;   graft left arm Left    for dialysis x 2. Removed   HOT HEMOSTASIS  11/18/2020   Procedure: HOT HEMOSTASIS (ARGON PLASMA COAGULATION/BICAP);  Surgeon: Stacia Glendia BRAVO, MD;  Location: [facility] ENDOSCOPY;  Service: Gastroenterology;;   HOT HEMOSTASIS N/A 09/23/2021   Procedure: HOT HEMOSTASIS (ARGON PLASMA COAGULATION/BICAP);  Surgeon: San Sandor GAILS, DO;  Location: [facility] ENDOSCOPY;  Service: Gastroenterology;  Laterality: N/A;   INSERTION OF DIALYSIS CATHETER     Rt chest   IR US  GUIDE BX ASP/DRAIN  02/25/2024   KNEE ARTHROSCOPY Right 02/21/2024   Procedure: ARTHROSCOPY, KNEE;  Surgeon: [name] Kay HERO, MD;  Location: [facility] OR;  Service: Orthopedics;  Laterality: Right;   LAPAROSCOPIC RIGHT COLECTOMY N/A 08/05/2015   Procedure: LAPAROSCOPIC RIGHT COLECTOMY- ASCENDING;  Surgeon: Jina Nephew, MD;  Location: [facility] OR;  Service: General;  Laterality: N/A;   LOWER EXTREMITY ANGIOGRAPHY N/A 12/09/2023    Procedure: Lower Extremity Angiography;  Surgeon: Sheree Penne Bruckner, MD;  Location: [facility] INVASIVE CV LAB;  Service: Cardiovascular;  Laterality: N/A;   LOWER EXTREMITY INTERVENTION N/A 12/09/2023   Procedure: LOWER EXTREMITY INTERVENTION;  Surgeon: Sheree Penne Bruckner, MD;  Location: [facility] INVASIVE CV LAB;  Service: Cardiovascular;  Laterality: N/A;   MASS EXCISION Left 05/28/2019   Procedure: EXCISION SOFT TISSUE MASS LEFT SHOULDER;  Surgeon: Eletha Boas, MD;  Location: [facility] ORS;  Service: General;  Laterality: Left;   NEPHRECTOMY Bilateral    PARATHYROIDECTOMY N/A 06/12/2016   Procedure: TOTAL PARATHYROIDECTOMY WITH AUTOTRANSPLANTATION TO LEFT FOREARM;  Surgeon: Boas Eletha, MD;  Location: [facility] OR;  Service: General;  Laterality: N/A;   PARATHYROIDECTOMY N/A 10/23/2016   Procedure: PARATHYROIDECTOMY;  Surgeon: Eletha Boas, MD;  Location: [facility] OR;  Service: General;  Laterality: N/A;   REVERSE SHOULDER ARTHROPLASTY Right 08/24/2020   Procedure: REVERSE SHOULDER ARTHROPLASTY;  Surgeon: Cristy Bonner DASEN, MD;  Location: [facility] OR;  Service: Orthopedics;  Laterality: Right;   REVISON OF ARTERIOVENOUS FISTULA Right 07/16/2017   Procedure: REVISION OF ARTERIOVENOUS FISTULA  Right ARM;  Surgeon: Sheree Penne Bruckner, MD;  Location: [facility] OR;  Service: Vascular;  Laterality: Right;   TOTAL HIP  ARTHROPLASTY Left 11/14/2020   Procedure: LEFT TOTAL HIP ARTHROPLASTY ANTERIOR APPROACH;  Surgeon: [name] Kay HERO, MD;  Location[facility] OR;  Service: Orthopedics;  Laterality: Left;   UPPER GASTROINTESTINAL ENDOSCOPY  2021   @ [facility]    Family History  Problem Relation Age of Onset   Diabetes Father    Stroke Father    Hypertension Father    Uterine cancer Mother    Lupus Sister    Stroke Sister    Hypertension Sister    Anuerysm Brother        brain   Colon cancer Neg Hx    Esophageal cancer Neg Hx    Stomach cancer Neg Hx    Pancreatic cancer Neg Hx    Liver disease Neg Hx    Colon polyps Neg Hx     Rectal cancer Neg Hx     Social History:  reports that he quit smoking about 33 years ago. His smoking use included cigarettes. He has never used smokeless tobacco. He reports that he does not currently use alcohol after a past usage of about 1.0 standard drink of alcohol per week. He reports that he does not currently use drugs after having used the following drugs: Marijuana.  Allergies: Allergies[1]  Medications: I have reviewed the patient's current medications.  Results for orders placed or performed during the hospital encounter of 02/08/24 (from the past 48 hours)  Body fluid culture w Gram Stain     Status: None (Preliminary result)   Collection Time: 02/25/24  4:23 PM   Specimen: SHOULDER; Body Fluid  Result Value Ref Range   Specimen Description FLUID RIGHT SHOULDER    Special Requests NO 2 ANTERIOR    Gram Stain      ABUNDANT WBC PRESENT, PREDOMINANTLY PMN NO ORGANISMS SEEN    Culture      NO GROWTH 2 DAYS Performed at [facility], [address] [place], [name]    Report Status PENDING   Body fluid culture w Gram Stain     Status: None (Preliminary result)   Collection Time: 02/25/24  4:27 PM   Specimen: Body Fluid  Result Value Ref Range   Specimen Description FLUID RIGHT SHOULDER    Special Requests NO 1 POSTERIOR    Gram Stain      ABUNDANT WBC PRESENT, PREDOMINANTLY PMN NO ORGANISMS SEEN    Culture      NO GROWTH 2 DAYS Performed at [name], [address], [place], [name]    Report Status PENDING   Synovial cell count + diff, w/ crystals     Status: Abnormal   Collection Time: 02/25/24  4:29 PM  Result Value Ref Range   Color, Synovial PINK (A) YELLOW   Appearance-Synovial TURBID (A) CLEAR   Crystals, Fluid QUANTITY NOT SUFFICIENT, UNABLE TO PERFORM TEST    WBC, Synovial 93,500 (H) 0 - 200 /cu mm   Neutrophil, Synovial QUANTITY NOT SUFFICIENT, UNABLE TO PERFORM TEST 0 - 25 %   Lymphocytes-Synovial Fld QUANTITY  NOT SUFFICIENT, UNABLE TO PERFORM TEST 0 - 20 %   Monocyte-Macrophage-Synovial Fluid QUANTITY NOT SUFFICIENT, UNABLE TO PERFORM TEST 50 - 90 %   Eosinophils-Synovial QUANTITY NOT SUFFICIENT, UNABLE TO PERFORM TEST 0 - 1 %   Other Cells-SYN CELL COUNT AND CULTURE ONLY, OK PER HENN ADAM, MD     Comment: Performed at [facility], [address] [place], [name]  Synovial cell count +  diff, w/ crystals     Status: Abnormal   Collection Time: 02/25/24  4:29 PM  Result Value Ref Range   Color, Synovial PINK (A) YELLOW   Appearance-Synovial TURBID (A) CLEAR   Crystals, Fluid QUANTITY NOT SUFFICIENT, UNABLE TO PERFORM TEST    WBC, Synovial 64,000 (H) 0 - 200 /cu mm   Neutrophil, Synovial QUANTITY NOT SUFFICIENT, UNABLE TO PERFORM TEST 0 - 25 %   Lymphocytes-Synovial Fld QUANTITY NOT SUFFICIENT, UNABLE TO PERFORM TEST 0 - 20 %   Monocyte-Macrophage-Synovial Fluid QUANTITY NOT SUFFICIENT, UNABLE TO PERFORM TEST 50 - 90 %   Eosinophils-Synovial QUANTITY NOT SUFFICIENT, UNABLE TO PERFORM TEST 0 - 1 %   Other Cells-SYN CELL COUNT AND CULTURE ONLY, OK PER HENN ADAM, MD     Comment: Performed at [facility], [address], [place], [name]  Glucose, capillary     Status: Abnormal   Collection Time: 02/25/24  8:38 PM  Result Value Ref Range   Glucose-Capillary 128 (H) 70 - 99 mg/dL    Comment: Glucose reference range applies only to samples taken after fasting for at least 8 hours.  Renal function panel     Status: Abnormal   Collection Time: 02/26/24  3:04 AM  Result Value Ref Range   Sodium 133 (L) 135 - 145 mmol/L   Potassium 5.0 3.5 - 5.1 mmol/L    Comment: HEMOLYSIS AT THIS LEVEL MAY AFFECT RESULT   Chloride 89 (L) 98 - 111 mmol/L   CO2 27 22 - 32 mmol/L   Glucose, Bld 116 (H) 70 - 99 mg/dL    Comment: Glucose reference range applies only to samples taken after fasting for at least 8 hours.   BUN 60 (H) 8 - 23 mg/dL   Creatinine, Ser 1.56 (H) 0.61 - 1.24  mg/dL   Calcium  8.2 (L) 8.9 - 10.3 mg/dL   Phosphorus 5.5 (H) 2.5 - 4.6 mg/dL   Albumin  2.9 (L) 3.5 - 5.0 g/dL   GFR, Estimated 6 (L) >60 mL/min    Comment: (NOTE) Calculated using the CKD-EPI Creatinine Equation (2021)    Anion gap 17 (H) 5 - 15    Comment: Performed at [facility], [address] [place], [name]  Glucose, capillary     Status: Abnormal   Collection Time: 02/26/24  7:03 AM  Result Value Ref Range   Glucose-Capillary 117 (H) 70 - 99 mg/dL    Comment: Glucose reference range applies only to samples taken after fasting for at least 8 hours.  Glucose, capillary     Status: Abnormal   Collection Time: 02/26/24  8:04 AM  Result Value Ref Range   Glucose-Capillary 106 (H) 70 - 99 mg/dL    Comment: Glucose reference range applies only to samples taken after fasting for at least 8 hours.  Glucose, capillary     Status: Abnormal   Collection Time: 02/26/24  1:09 PM  Result Value Ref Range   Glucose-Capillary 104 (H) 70 - 99 mg/dL    Comment: Glucose reference range applies only to samples taken after fasting for at least 8 hours.  Glucose, capillary     Status: None   Collection Time: 02/26/24  4:33 PM  Result Value Ref Range   Glucose-Capillary 90 70 - 99 mg/dL    Comment: Glucose reference range applies only to samples taken after fasting for at least 8 hours.   Comment 1 Document in Chart   Glucose, capillary  Status: None   Collection Time: 02/26/24 10:11 PM  Result Value Ref Range   Glucose-Capillary 94 70 - 99 mg/dL    Comment: Glucose reference range applies only to samples taken after fasting for at least 8 hours.  CBC     Status: Abnormal   Collection Time: 02/27/24  3:01 AM  Result Value Ref Range   WBC 6.2 4.0 - 10.5 K/uL   RBC 2.67 (L) 4.22 - 5.81 MIL/uL   Hemoglobin 7.8 (L) 13.0 - 17.0 g/dL   HCT 75.6 (L) 60.9 - 47.9 %   MCV 91.0 80.0 - 100.0 fL   MCH 29.2 26.0 - 34.0 pg   MCHC 32.1 30.0 - 36.0 g/dL   RDW 84.4 88.4 - 84.4 %    Platelets 335 150 - 400 K/uL   nRBC 0.0 0.0 - 0.2 %    Comment: Performed at [facility], [address], [place], [name]  Comprehensive metabolic panel     Status: Abnormal   Collection Time: 02/27/24  3:01 AM  Result Value Ref Range   Sodium 133 (L) 135 - 145 mmol/L   Potassium 4.2 3.5 - 5.1 mmol/L   Chloride 91 (L) 98 - 111 mmol/L   CO2 28 22 - 32 mmol/L   Glucose, Bld 87 70 - 99 mg/dL    Comment: Glucose reference range applies only to samples taken after fasting for at least 8 hours.   BUN 33 (H) 8 - 23 mg/dL   Creatinine, Ser 4.19 (H) 0.61 - 1.24 mg/dL   Calcium  8.6 (L) 8.9 - 10.3 mg/dL   Total Protein 7.8 6.5 - 8.1 g/dL   Albumin  3.0 (L) 3.5 - 5.0 g/dL   AST 18 15 - 41 U/L   ALT <5 0 - 44 U/L   Alkaline Phosphatase 98 38 - 126 U/L   Total Bilirubin 0.4 0.0 - 1.2 mg/dL   GFR, Estimated 10 (L) >60 mL/min    Comment: (NOTE) Calculated using the CKD-EPI Creatinine Equation (2021)    Anion gap 14 5 - 15    Comment: Performed at [facility], [address], [place], [name]  Phosphorus     Status: None   Collection Time: 02/27/24  3:01 AM  Result Value Ref Range   Phosphorus 4.3 2.5 - 4.6 mg/dL    Comment: Performed at [name], [address], [place], [name]  Glucose, capillary     Status: None   Collection Time: 02/27/24  8:44 AM  Result Value Ref Range   Glucose-Capillary 74 70 - 99 mg/dL    Comment: Glucose reference range applies only to samples taken after fasting for at least 8 hours.    IR ARTHRO ASP OR INJ MAJOR JOINT OR BURSA Result Date: 02/25/2024 INDICATION: 64 year old with a septic right knee and right shoulder symptoms. Previous MRI demonstrated a fluid collection along the posterior soft tissues of the right shoulder. Patient is status post right shoulder arthroplasty. EXAM: Ultrasound-guided aspiration of fluid collections around the right shoulder MEDICATIONS: 1% lidocaine  for local anesthetic  ANESTHESIA/SEDATION: None COMPLICATIONS: None immediate. PROCEDURE: Informed written consent was obtained from the patient after a thorough discussion of the procedural risks, benefits and alternatives. All questions were addressed. A timeout was performed prior to the initiation of the procedure. Right shoulder was evaluated with ultrasound. Small amount of complex fluid in the deep soft tissues along the posterior and anterior aspect of the right shoulder  was identified with ultrasound. The posterior collection appeared to correspond with the MRI from 02/01/2024. Anterior and posterior aspect of the right shoulder was prepped with chlorhexidine  and sterile field was created. Skin was anesthetized with 1% lidocaine . Using ultrasound guidance, a 22 gauge needle was directed into the posterior collection and a small amount of brownish fluid was aspirated. Using ultrasound guidance, an 18 gauge needle was directed into the anterior right shoulder and a small amount of yellow fluid was aspirated. The fluid was sent for culture, crystals and cell count. FINDINGS: Complex hypoechoic fluid along the posterior right shoulder corresponding with the previous MRI findings. 1 mL brown fluid was aspirated from this area. Small amount of fluid and edematous tissue along the anterior right shoulder. 1 mL of yellow fluid was aspirated from the anterior right shoulder. IMPRESSION: Ultrasound-guided aspiration of a small amount of fluid from the anterior and posterior right shoulder soft tissues. Electronically Signed   By: Juliene Balder M.D.   On: 02/25/2024 16:56    Review of Systems  Musculoskeletal:  Positive for arthralgias.  All other systems reviewed and are negative.  Blood pressure 104/68, pulse 96, temperature 99.3 F (37.4 C), resp. rate 19, height 6' 1 (1.854 m), weight 82.9 kg, SpO2 99%. Physical Exam Vitals reviewed.  HENT:     Head: Normocephalic.     Nose: Nose normal.     Mouth/Throat:     Mouth: Mucous  membranes are moist.  Eyes:     Pupils: Pupils are equal, round, and reactive to light.  Cardiovascular:     Rate and Rhythm: Normal rate.     Pulses: Normal pulses.  Pulmonary:     Effort: Pulmonary effort is normal.  Abdominal:     General: Abdomen is flat.  Musculoskeletal:     Cervical back: Normal range of motion.  Skin:    General: Skin is warm.     Capillary Refill: Capillary refill takes less than 2 seconds.  Neurological:     General: No focal deficit present.     Mental Status: He is alert.  Psychiatric:        Mood and Affect: Mood normal.    Examination of the right shoulder demonstrates functional deltoid with functioning fistula in the proximal forearm region.  He does have mildly painful range of motion but no swelling effusion or warmth around the incision or around the shoulder itself. 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  Plan is implant removal and antibiotic spacer placement.  May or may not be able to reimplant down the road.  Risks and benefits are discussed.  All questions answered.  Last Plavix  dose was yesterday.  Plan to do this tomorrow afternoon.  G Scott Shellsea Borunda 02/27/2024, 12:13 PM         [1]  Allergies Allergen Reactions   Infed [Iron Dextran] Other (See Comments)    Decreased BP    Vicodin [Hydrocodone -Acetaminophen ] Other (See Comments)    Decreased BP   Hydrocodone  Other (See Comments)    Unable to recall reaction   Voltaren [Diclofenac] Other (See Comments)    Hypotension

## 2024-02-27 NOTE — Progress Notes (Signed)
 [facility]  PROGRESS NOTE  [name]. FMW:[id number] DOB: 1960-09-14 DOA: 02/08/2024 PCP: [name], MD   Brief HPI:   64 year old with past medical history significant for ESRD on hemodialysis MWF, PVD s/p angioplasty right S FA October 2025 for ischemic rest pain as well as C4 spinal fracture April 2025 with incomplete spinal cord injury requiring C4 corpectomy, fusion and interbody cage by Dr. [name], wheelchair-bound for a time but progressed by Agurs to walking with a cane and driving, presented with right arm and leg weakness, fall as well as posterior neck pain, fever, ear drainage.   Admitted started on antibiotics, neurosurgery, neurology orthopedic ENT and nephrology consulted.   Diagnosed with otitis media and ruptured eardrum.  Right-sided weakness believed to be recrudescence of old spinal cord injury symptoms in the setting of infection.   He underwent Arthrocentesis of right knee, synovial fluid consistent with septic arthritis, underwent irrigation and debridement by Dr. [name] on 1/9. ID consulted recommend 4 weeks of IV antibiotics with HD> Vancomycin  and cefepime . End date 03/23/2024.   Spike fever 1/12 overnight. ID will follow up on patient today. ID also recommend Ortho evaluation for fluid collection of Right Shoulder. Ortho will consult IR for aspiration.       Assessment/Plan:   Sepsis due to otitis media with suspected TM perforation, right knee septic arthritis - Met sepsis criteria on admission; sepsis physiology has resolved - Seen by ENT, underwent CT of temporal bone without evidence of coalescent mastoiditis, bone lesion or abscess - Completed 10 days of antibiotics for otitis media  -needs follow up with ENT on January 5 -- 12th. I sent message through secure chat to Dr [name].  - Treated with  Ciprodex  drops, ENT recommend it for 14 days. Completed.      Right-sided weakness History of cervical decompression corpectomy C4 by [name] April  2025 Remote left occipital infarct - There was some concern for recurrent spinal cord injury, neurosurgery and neurology were consulted.   -A spinal MRI showed no new cord injury. - It was thought that his symptoms were recrudescence of old  symptoms due to  acute infection.    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  Had knee aspiration and injection on  1/06  -knee synovial fluid, no growth to date.  -WBC Synovial fluid notice to be 71,000.  -Treated with Augmentin  and Linezolid .  -ID consulted , recommend IV antibiotics with HD for 4 weeks.  Ortho consulted for evaluation of fluid collection right shoulder in setting of persistent fever.  -IR was consulted and small amount of fluid in soft tissue anterior and posterior to right shoulder aspirated. - Fluid culture showed no growth; WBC count 93,000 -Seen by Dr. Glendia today, plan for removal of implant and antibiotic spacer placement.  May or may not be able to reimplant in future.    -Peripheral vascular disease s/p angioplasty October 2025; - Continue aspirin  Lipitor and Plavix    ESRD on hemodialysis: MWF - Nephrology following.   -Lokelma   daily  Continue with HD.    Chronic hypotension, asymptomatic -Continue midodrine  Refuse meds at times.  ECHO normal EF. 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  Per cardiology small mobile echodensity on the ventricular aspect of the aortic valve is likely degenerative changes of the valve.  Also blood  cultures has been negative. -spike fever, repeat blood cultures.  -ID informed of echo finding.  -No plan to do TEE as this would not change management as per ID.  He would not be candidate for CT surgery and plan to get 6 weeks of vancomycin  and cefepime .   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 He would not accept blood transfusion do to religious believes.  Monitor hemoglobin        DVT prophylaxis: Heparin   Medications     (feeding supplement) PROSource Plus  30 mL Oral BID BM   aspirin  EC  81 mg Oral Daily   atorvastatin   10 mg Oral Daily   bupivacaine (PF)  10 mL Infiltration Once   chlorhexidine   60 mL Topical Once   Chlorhexidine  Gluconate Cloth  6 each Topical Q0600   Chlorhexidine  Gluconate Cloth  6 each Topical Q0600   cinacalcet   120 mg Oral Q breakfast   clopidogrel   75 mg Oral Daily   [START ON 02/28/2024] darbepoetin (ARANESP ) injection - DIALYSIS  100 mcg Subcutaneous Q Fri-1800   feeding supplement ([employer] STANDARD 1.4) Liquid  325 mL Oral Daily   ferrous sulfate   325 mg Oral Q breakfast   folic acid   1 mg Oral Daily   gabapentin   300 mg Oral QHS   heparin  injection (subcutaneous)  5,000 Units Subcutaneous Q8H   insulin  aspart  0-6 Units Subcutaneous TID WC   lidocaine   1 patch Transdermal Q24H   lidocaine   1 patch Transdermal Q24H   midodrine   10 mg Oral TID WC   multivitamin  1 tablet Oral QHS   pantoprazole   40 mg Oral BID AC   povidone-iodine   2 Application Topical Once   psyllium  1 packet Oral Daily   sevelamer  carbonate  2,400 mg Oral TID WC   sodium chloride  flush  3 mL Intravenous Q12H     Data Reviewed:   CBG:  Recent Labs  Lab 02/26/24 0804 02/26/24 1309 02/26/24 1633 02/26/24 2211 02/27/24 0844  GLUCAP 106* 104* 90 94 74    SpO2: 99 % O2 Flow Rate (L/min): 2 L/min    Vitals:   02/27/24 0050 02/27/24 0500 02/27/24 0519 02/27/24 0843  BP: 104/69  106/68 104/68  Pulse: 89  91 96  Resp: 17   19  Temp: 98.4 F (36.9 C)  98.2 F (36.8 C) 99.3 F (37.4 C)  TempSrc:      SpO2: 97%  100% 99%  Weight:  82.9 kg    Height:          Data  Reviewed:  Basic Metabolic Panel: Recent Labs  Lab 02/23/24 0557 02/24/24 0506 02/25/24 0531 02/26/24 0304 02/27/24 0301  NA 135 134* 136 133* 133*  K 4.6 4.5 4.6 5.0 4.2  CL 91* 89* 91* 89* 91*  CO2 26 24 28 27 28   GLUCOSE 83 84 82 116* 87  BUN 61* 72* 45* 60* 33*  CREATININE 7.43* 9.23* 6.63* 8.43* 5.80*  CALCIUM  8.6* 8.3* 8.7* 8.2* 8.6*  PHOS 6.5* 6.7* 4.7* 5.5* 4.3    CBC: Recent Labs  Lab 02/21/24 0505 02/21/24 1237 02/23/24 1053 02/24/24 0757 02/27/24 0301  WBC 7.2  --  9.9 9.5 6.2  HGB 10.4* 9.5* 7.9* 8.1* 7.8*  HCT 33.5* 28.0* 24.7* 25.3* 24.3*  MCV 93.8  --  90.8 91.0 91.0  PLT 517*  --  474* 478* 335    LFT Recent Labs  Lab 02/23/24 0557 02/24/24 0506 02/25/24 0531 02/26/24 0304 02/27/24 0301  AST  --   --   --   --  18  ALT  --   --   --   --  <5  ALKPHOS  --   --   --   --  98  BILITOT  --   --   --   --  0.4  PROT  --   --   --   --  7.8  ALBUMIN  2.8* 3.0* 3.1* 2.9* 3.0*     Antibiotics: Anti-infectives (From admission, onward)    Start     Dose/Rate Route Frequency Ordered Stop   02/27/24 0745  ceFAZolin  (ANCEF ) IVPB 2g/100 mL premix        2 g 200 mL/hr over 30 Minutes Intravenous On call to O.R. 02/27/24 0647 02/28/24 0559   02/26/24 1200  vancomycin  (VANCOCIN ) IVPB 1000 mg/200 mL premix        1,000 mg 200 mL/hr over 60 Minutes Intravenous Every M-W-F (Hemodialysis) 02/24/24 0951     02/24/24 1500  vancomycin  (VANCOREADY) IVPB 2000 mg/400 mL        2,000 mg 200 mL/hr over 120 Minutes Intravenous  Once 02/24/24 0945 02/24/24 1854   02/24/24 1200  ceFEPIme  (MAXIPIME ) 2 g in sodium chloride  0.9 % 100 mL IVPB        2 g 200 mL/hr over 30 Minutes Intravenous Every M-W-F (Hemodialysis) 02/24/24 0945     02/24/24 0000  ceFEPime  (MAXIPIME ) IVPB        2 g Intravenous Every M-W-F (Hemodialysis) 02/24/24 1121 03/23/24 2359   02/24/24 0000  vancomycin  IVPB        1,000 mg Intravenous Every M-W-F (Hemodialysis) 02/24/24 1121 03/23/24 2359    02/22/24 1000  amoxicillin -clavulanate (AUGMENTIN ) 500-125 MG per tablet 1 tablet  Status:  Discontinued        1 tablet Oral 2 times daily 02/22/24 0935 02/24/24 0945   02/22/24 0600  ceFAZolin  (ANCEF ) IVPB 2g/100 mL premix  Status:  Discontinued        2 g 200 mL/hr over 30 Minutes Intravenous On call to O.R. 02/21/24 1200 02/21/24 1605   02/21/24 2200  amoxicillin -clavulanate (AUGMENTIN ) 500-125 MG per tablet 1 tablet  Status:  Discontinued        1 tablet Oral Daily at bedtime 02/20/24 1042 02/22/24 0935   02/20/24 1130  linezolid  (ZYVOX ) tablet 600 mg  Status:  Discontinued        600 mg Oral Every 12 hours 02/20/24 1037 02/24/24 0945   02/20/24 1130  amoxicillin -clavulanate (AUGMENTIN ) 875-125 MG per tablet 1 tablet  Status:  Discontinued        1 tablet Oral Every 12 hours 02/20/24 1038 02/20/24 1041   02/20/24 1130  amoxicillin -clavulanate (AUGMENTIN ) 500-125 MG per tablet 1 tablet        1 tablet Oral  Once 02/20/24 1042 02/20/24 1329   02/19/24 1545  amoxicillin -clavulanate (AUGMENTIN ) 500-125 MG per tablet 1 tablet  Status:  Discontinued        1 tablet Oral  Once 02/19/24 1527 02/20/24 1038   02/17/24 0615  amoxicillin -clavulanate (AUGMENTIN ) 500-125 MG per tablet 1 tablet        1 tablet Oral Every 12 hours 02/17/24 0556 02/18/24 2151   02/09/24 0600  piperacillin -tazobactam (ZOSYN ) IVPB 2.25 g  Status:  Discontinued  2.25 g 100 mL/hr over 30 Minutes Intravenous Every 8 hours 02/09/24 0135 02/17/24 0556   02/08/24 2200  amoxicillin -clavulanate (AUGMENTIN ) 875-125 MG per tablet 1 tablet  Status:  Discontinued        1 tablet Oral Every 12 hours 02/08/24 2104 02/09/24 0135   02/08/24 2200  piperacillin -tazobactam (ZOSYN ) IVPB 3.375 g        3.375 g 100 mL/hr over 30 Minutes Intravenous  Once 02/08/24 2153 02/08/24 2314   02/08/24 0000  amoxicillin -clavulanate (AUGMENTIN ) 875-125 MG tablet  Status:  Discontinued        1 tablet Oral Every 12 hours 02/08/24 1908 02/24/24          CONSULTS nephrology, ID  Code Status: Full code  Family Communication: No family at bedside     Subjective   Patient seen and examined, seen by orthopedics for 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  Plan for implant removal and antibiotic spacer placement.   Objective    Physical Examination:  General-appears in no acute distress Heart-S1-S2, regular, no murmur auscultated Lungs-clear to auscultation bilaterally, no wheezing or crackles auscultated Abdomen-soft, nontender, no organomegaly Extremities-no edema in the lower extremities Neuro-alert, oriented x3, no focal deficit noted            Haik Mahoney S Eula Mazzola   [facility] If 7PM-7AM, please contact night-coverage at [email or web address], Office  [phone]   02/27/2024, 9:02 AM  LOS: 16 days

## 2024-02-27 NOTE — Progress Notes (Signed)
 Floor nurse called inquiring whether surgeon wanted Plavix  given today or to hold. Dr. [name] contacted and stated to hold today's dose. RN made aware.

## 2024-02-28 ENCOUNTER — Other Ambulatory Visit: Payer: Self-pay

## 2024-02-28 ENCOUNTER — Encounter (HOSPITAL_COMMUNITY)
Admission: EM | Disposition: A | Discharge status: Rehabilitation Facility | Payer: Self-pay | Source: Home / Self Care | Attending: Internal Medicine

## 2024-02-28 ENCOUNTER — Inpatient Hospital Stay (HOSPITAL_COMMUNITY): Admitting: Anesthesiology

## 2024-02-28 ENCOUNTER — Encounter (HOSPITAL_COMMUNITY): Payer: Self-pay | Admitting: Family Medicine

## 2024-02-28 DIAGNOSIS — H66012 Acute suppurative otitis media with spontaneous rupture of ear drum, left ear: Secondary | ICD-10-CM | POA: Diagnosis not present

## 2024-02-28 DIAGNOSIS — L0291 Cutaneous abscess, unspecified: Secondary | ICD-10-CM

## 2024-02-28 DIAGNOSIS — M25511 Pain in right shoulder: Secondary | ICD-10-CM

## 2024-02-28 DIAGNOSIS — I10 Essential (primary) hypertension: Secondary | ICD-10-CM

## 2024-02-28 DIAGNOSIS — E119 Type 2 diabetes mellitus without complications: Secondary | ICD-10-CM

## 2024-02-28 DIAGNOSIS — M009 Pyogenic arthritis, unspecified: Secondary | ICD-10-CM | POA: Diagnosis not present

## 2024-02-28 DIAGNOSIS — T8459XA Infection and inflammatory reaction due to other internal joint prosthesis, initial encounter: Secondary | ICD-10-CM

## 2024-02-28 DIAGNOSIS — Z87891 Personal history of nicotine dependence: Secondary | ICD-10-CM

## 2024-02-28 DIAGNOSIS — R29898 Other symptoms and signs involving the musculoskeletal system: Secondary | ICD-10-CM | POA: Diagnosis not present

## 2024-02-28 HISTORY — PX: HARDWARE REMOVAL: SHX979

## 2024-02-28 LAB — BASIC METABOLIC PANEL WITH GFR
Anion gap: 17 — ABNORMAL HIGH (ref 5–15)
BUN: 54 mg/dL — ABNORMAL HIGH (ref 8–23)
CO2: 26 mmol/L (ref 22–32)
Calcium: 8.7 mg/dL — ABNORMAL LOW (ref 8.9–10.3)
Chloride: 89 mmol/L — ABNORMAL LOW (ref 98–111)
Creatinine, Ser: 7.54 mg/dL — ABNORMAL HIGH (ref 0.61–1.24)
GFR, Estimated: 7 mL/min — ABNORMAL LOW
Glucose, Bld: 88 mg/dL (ref 70–99)
Potassium: 4.8 mmol/L (ref 3.5–5.1)
Sodium: 132 mmol/L — ABNORMAL LOW (ref 135–145)

## 2024-02-28 LAB — CBC WITH DIFFERENTIAL/PLATELET
Abs Immature Granulocytes: 0.05 K/uL (ref 0.00–0.07)
Basophils Absolute: 0.1 K/uL (ref 0.0–0.1)
Basophils Relative: 1 %
Eosinophils Absolute: 0.3 K/uL (ref 0.0–0.5)
Eosinophils Relative: 4 %
HCT: 22.3 % — ABNORMAL LOW (ref 39.0–52.0)
Hemoglobin: 7.3 g/dL — ABNORMAL LOW (ref 13.0–17.0)
Immature Granulocytes: 1 %
Lymphocytes Relative: 10 %
Lymphs Abs: 0.8 K/uL (ref 0.7–4.0)
MCH: 29 pg (ref 26.0–34.0)
MCHC: 32.7 g/dL (ref 30.0–36.0)
MCV: 88.5 fL (ref 80.0–100.0)
Monocytes Absolute: 1.2 K/uL — ABNORMAL HIGH (ref 0.1–1.0)
Monocytes Relative: 14 %
Neutro Abs: 5.6 K/uL (ref 1.7–7.7)
Neutrophils Relative %: 70 %
Platelets: 350 K/uL (ref 150–400)
RBC: 2.52 MIL/uL — ABNORMAL LOW (ref 4.22–5.81)
RDW: 15.4 % (ref 11.5–15.5)
WBC: 8 K/uL (ref 4.0–10.5)
nRBC: 0 % (ref 0.0–0.2)

## 2024-02-28 LAB — BODY FLUID CULTURE W GRAM STAIN
Culture: NO GROWTH
Culture: NO GROWTH

## 2024-02-28 LAB — POCT I-STAT, CHEM 8
BUN: 25 mg/dL — ABNORMAL HIGH (ref 8–23)
Calcium, Ion: 1.01 mmol/L — ABNORMAL LOW (ref 1.15–1.40)
Chloride: 94 mmol/L — ABNORMAL LOW (ref 98–111)
Creatinine, Ser: 4.3 mg/dL — ABNORMAL HIGH (ref 0.61–1.24)
Glucose, Bld: 86 mg/dL (ref 70–99)
HCT: 24 % — ABNORMAL LOW (ref 39.0–52.0)
Hemoglobin: 8.2 g/dL — ABNORMAL LOW (ref 13.0–17.0)
Potassium: 4.4 mmol/L (ref 3.5–5.1)
Sodium: 134 mmol/L — ABNORMAL LOW (ref 135–145)
TCO2: 29 mmol/L (ref 22–32)

## 2024-02-28 LAB — CBC
HCT: 26.2 % — ABNORMAL LOW (ref 39.0–52.0)
Hemoglobin: 8.3 g/dL — ABNORMAL LOW (ref 13.0–17.0)
MCH: 28.5 pg (ref 26.0–34.0)
MCHC: 31.7 g/dL (ref 30.0–36.0)
MCV: 90 fL (ref 80.0–100.0)
Platelets: 324 K/uL (ref 150–400)
RBC: 2.91 MIL/uL — ABNORMAL LOW (ref 4.22–5.81)
RDW: 15.5 % (ref 11.5–15.5)
WBC: 7 K/uL (ref 4.0–10.5)
nRBC: 0 % (ref 0.0–0.2)

## 2024-02-28 LAB — RENAL FUNCTION PANEL
Albumin: 3.1 g/dL — ABNORMAL LOW (ref 3.5–5.0)
Anion gap: 16 — ABNORMAL HIGH (ref 5–15)
BUN: 53 mg/dL — ABNORMAL HIGH (ref 8–23)
CO2: 28 mmol/L (ref 22–32)
Calcium: 8.7 mg/dL — ABNORMAL LOW (ref 8.9–10.3)
Chloride: 90 mmol/L — ABNORMAL LOW (ref 98–111)
Creatinine, Ser: 7.7 mg/dL — ABNORMAL HIGH (ref 0.61–1.24)
GFR, Estimated: 7 mL/min — ABNORMAL LOW
Glucose, Bld: 96 mg/dL (ref 70–99)
Phosphorus: 5.2 mg/dL — ABNORMAL HIGH (ref 2.5–4.6)
Potassium: 4.6 mmol/L (ref 3.5–5.1)
Sodium: 133 mmol/L — ABNORMAL LOW (ref 135–145)

## 2024-02-28 LAB — GLUCOSE, CAPILLARY
Glucose-Capillary: 83 mg/dL (ref 70–99)
Glucose-Capillary: 85 mg/dL (ref 70–99)
Glucose-Capillary: 105 mg/dL — ABNORMAL HIGH (ref 70–99)

## 2024-02-28 LAB — SURGICAL PCR SCREEN
MRSA, PCR: NEGATIVE
Staphylococcus aureus: NEGATIVE

## 2024-02-28 MED ORDER — SODIUM CHLORIDE 0.9 % IV SOLN
20.0000 ug | Freq: Once | INTRAVENOUS | Status: DC
  Filled 2024-02-28 (×2): qty 5

## 2024-02-28 MED ORDER — PHENYLEPHRINE 80 MCG/ML (10ML) SYRINGE FOR IV PUSH (FOR BLOOD PRESSURE SUPPORT)
PREFILLED_SYRINGE | INTRAVENOUS | Status: AC
  Filled 2024-02-28: qty 20

## 2024-02-28 MED ORDER — ALBUMIN HUMAN 5 % IV SOLN: INTRAVENOUS | Status: DC | PRN

## 2024-02-28 MED ORDER — POVIDONE-IODINE 10 % EX SWAB
2.0000 | Freq: Once | CUTANEOUS | Status: AC
  Administered 2024-02-28: 2 via TOPICAL

## 2024-02-28 MED ORDER — LIDOCAINE 2% (20 MG/ML) 5 ML SYRINGE
INTRAMUSCULAR | Status: DC | PRN
  Administered 2024-02-28: 60 mg via INTRAVENOUS

## 2024-02-28 MED ORDER — ONDANSETRON HCL 4 MG PO TABS: 4.0000 mg | ORAL_TABLET | Freq: Four times a day (QID) | ORAL | Status: DC | PRN

## 2024-02-28 MED ORDER — CHLORHEXIDINE GLUCONATE 0.12 % MT SOLN
15.0000 mL | Freq: Once | OROMUCOSAL | Status: AC
  Administered 2024-02-28: 15 mL via OROMUCOSAL
  Filled 2024-02-28: qty 15

## 2024-02-28 MED ORDER — GENTAMICIN SULFATE 40 MG/ML IJ SOLN
INTRAMUSCULAR | Status: AC
  Filled 2024-02-28: qty 2

## 2024-02-28 MED ORDER — SODIUM CHLORIDE 0.9 % IV SOLN: INTRAVENOUS | Status: AC

## 2024-02-28 MED ORDER — MIDODRINE HCL 5 MG PO TABS
ORAL_TABLET | ORAL | Status: AC
  Filled 2024-02-28: qty 2

## 2024-02-28 MED ORDER — PROPOFOL 10 MG/ML IV BOLUS
INTRAVENOUS | Status: AC
  Filled 2024-02-28: qty 20

## 2024-02-28 MED ORDER — PHENOL 1.4 % MT LIQD: 1.0000 | OROMUCOSAL | Status: DC | PRN

## 2024-02-28 MED ORDER — VANCOMYCIN HCL IN DEXTROSE 1-5 GM/200ML-% IV SOLN
INTRAVENOUS | Status: AC
  Filled 2024-02-28: qty 200

## 2024-02-28 MED ORDER — MIDAZOLAM HCL (PF) 2 MG/2ML IJ SOLN
INTRAMUSCULAR | Status: DC | PRN
  Administered 2024-02-28: 1 mg via INTRAVENOUS

## 2024-02-28 MED ORDER — DEXAMETHASONE SOD PHOSPHATE PF 10 MG/ML IJ SOLN
INTRAMUSCULAR | Status: AC
  Filled 2024-02-28: qty 1

## 2024-02-28 MED ORDER — GENTAMICIN SULFATE 40 MG/ML IJ SOLN
INTRAMUSCULAR | Status: DC | PRN
  Administered 2024-02-28: 160 mg

## 2024-02-28 MED ORDER — DOCUSATE SODIUM 100 MG PO CAPS
100.0000 mg | ORAL_CAPSULE | Freq: Two times a day (BID) | ORAL | Status: DC
  Administered 2024-02-28 – 2024-03-03 (×6): 100 mg via ORAL
  Filled 2024-02-28 (×6): qty 1

## 2024-02-28 MED ORDER — OXYCODONE HCL 5 MG/5ML PO SOLN: 5.0000 mg | Freq: Once | ORAL | Status: DC | PRN

## 2024-02-28 MED ORDER — PROPOFOL 10 MG/ML IV BOLUS
INTRAVENOUS | Status: DC | PRN
  Administered 2024-02-28: 100 mg via INTRAVENOUS

## 2024-02-28 MED ORDER — PHENYLEPHRINE 80 MCG/ML (10ML) SYRINGE FOR IV PUSH (FOR BLOOD PRESSURE SUPPORT)
PREFILLED_SYRINGE | INTRAVENOUS | Status: DC | PRN
  Administered 2024-02-28: 80 ug via INTRAVENOUS

## 2024-02-28 MED ORDER — 0.9 % SODIUM CHLORIDE (POUR BTL) OPTIME
TOPICAL | Status: DC | PRN
  Administered 2024-02-28: 1000 mL

## 2024-02-28 MED ORDER — FENTANYL CITRATE (PF) 100 MCG/2ML IJ SOLN
INTRAMUSCULAR | Status: DC | PRN
  Administered 2024-02-28 (×2): 25 ug via INTRAVENOUS
  Administered 2024-02-28: 50 ug via INTRAVENOUS

## 2024-02-28 MED ORDER — ROPIVACAINE HCL 5 MG/ML IJ SOLN
INTRAMUSCULAR | Status: DC | PRN
  Administered 2024-02-28: 30 mL via PERINEURAL

## 2024-02-28 MED ORDER — ONDANSETRON HCL 4 MG/2ML IJ SOLN
INTRAMUSCULAR | Status: DC | PRN
  Administered 2024-02-28: 4 mg via INTRAVENOUS

## 2024-02-28 MED ORDER — HYDROMORPHONE HCL 1 MG/ML IJ SOLN
INTRAMUSCULAR | Status: AC
  Filled 2024-02-28: qty 0.5

## 2024-02-28 MED ORDER — FENTANYL CITRATE (PF) 250 MCG/5ML IJ SOLN: INTRAMUSCULAR | Status: DC | PRN

## 2024-02-28 MED ORDER — OXYCODONE HCL 5 MG PO TABS: 5.0000 mg | ORAL_TABLET | Freq: Once | ORAL | Status: DC | PRN

## 2024-02-28 MED ORDER — VANCOMYCIN HCL 1000 MG IV SOLR
INTRAVENOUS | Status: AC
  Filled 2024-02-28: qty 20

## 2024-02-28 MED ORDER — ROCURONIUM BROMIDE 10 MG/ML (PF) SYRINGE
PREFILLED_SYRINGE | INTRAVENOUS | Status: DC | PRN
  Administered 2024-02-28: 70 mg via INTRAVENOUS

## 2024-02-28 MED ORDER — VANCOMYCIN HCL 1000 MG IV SOLR
INTRAVENOUS | Status: DC | PRN
  Administered 2024-02-28: 1000 mg

## 2024-02-28 MED ORDER — STERILE WATER FOR IRRIGATION IR SOLN
Status: DC | PRN
  Administered 2024-02-28: 1000 mL

## 2024-02-28 MED ORDER — INSULIN ASPART 100 UNIT/ML IJ SOLN: 0.0000 [IU] | INTRAMUSCULAR | Status: DC | PRN

## 2024-02-28 MED ORDER — TRANEXAMIC ACID-NACL 1000-0.7 MG/100ML-% IV SOLN
1000.0000 mg | INTRAVENOUS | Status: AC
  Administered 2024-02-28: 1000 mg via INTRAVENOUS

## 2024-02-28 MED ORDER — HYDROMORPHONE HCL 1 MG/ML IJ SOLN
INTRAMUSCULAR | Status: DC | PRN
  Administered 2024-02-28: .5 mg via INTRAVENOUS

## 2024-02-28 MED ORDER — METOCLOPRAMIDE HCL 5 MG PO TABS: 5.0000 mg | ORAL_TABLET | Freq: Three times a day (TID) | ORAL | Status: DC | PRN

## 2024-02-28 MED ORDER — POVIDONE-IODINE 7.5 % EX SOLN
1.0000 | Freq: Once | CUTANEOUS | Status: DC
  Filled 2024-02-28: qty 118

## 2024-02-28 MED ORDER — TRANEXAMIC ACID-NACL 1000-0.7 MG/100ML-% IV SOLN
INTRAVENOUS | Status: AC
  Filled 2024-02-28: qty 100

## 2024-02-28 MED ORDER — FENTANYL CITRATE (PF) 100 MCG/2ML IJ SOLN
INTRAMUSCULAR | Status: AC
  Filled 2024-02-28: qty 2

## 2024-02-28 MED ORDER — MENTHOL 3 MG MT LOZG: 1.0000 | LOZENGE | OROMUCOSAL | Status: DC | PRN

## 2024-02-28 MED ORDER — MIDAZOLAM HCL 2 MG/2ML IJ SOLN
INTRAMUSCULAR | Status: AC
  Filled 2024-02-28: qty 2

## 2024-02-28 MED ORDER — AMISULPRIDE (ANTIEMETIC) 5 MG/2ML IV SOLN: 10.0000 mg | Freq: Once | INTRAVENOUS | Status: DC | PRN

## 2024-02-28 MED ORDER — SODIUM CHLORIDE (PF) 0.9 % IJ SOLN
INTRAMUSCULAR | Status: DC | PRN
  Administered 2024-02-28 (×4): 3000 mL

## 2024-02-28 MED ORDER — DEXAMETHASONE SOD PHOSPHATE PF 10 MG/ML IJ SOLN
INTRAMUSCULAR | Status: DC | PRN
  Administered 2024-02-28: 5 mg via INTRAVENOUS

## 2024-02-28 MED ORDER — ONDANSETRON HCL 4 MG/2ML IJ SOLN
INTRAMUSCULAR | Status: AC
  Filled 2024-02-28: qty 2

## 2024-02-28 MED ORDER — SODIUM CHLORIDE 0.9 % IV SOLN: INTRAVENOUS | Status: DC

## 2024-02-28 MED ORDER — LIDOCAINE 2% (20 MG/ML) 5 ML SYRINGE
INTRAMUSCULAR | Status: AC
  Filled 2024-02-28: qty 5

## 2024-02-28 MED ORDER — SUGAMMADEX SODIUM 200 MG/2ML IV SOLN
INTRAVENOUS | Status: DC | PRN
  Administered 2024-02-28: 200 mg via INTRAVENOUS

## 2024-02-28 MED ORDER — ROCURONIUM BROMIDE 10 MG/ML (PF) SYRINGE
PREFILLED_SYRINGE | INTRAVENOUS | Status: AC
  Filled 2024-02-28: qty 10

## 2024-02-28 MED ORDER — PHENYLEPHRINE HCL-NACL 20-0.9 MG/250ML-% IV SOLN
INTRAVENOUS | Status: DC | PRN
  Administered 2024-02-28: 15 ug/min via INTRAVENOUS
  Administered 2024-02-28: 25 ug/min via INTRAVENOUS

## 2024-02-28 MED ORDER — METOCLOPRAMIDE HCL 5 MG/ML IJ SOLN: 5.0000 mg | Freq: Three times a day (TID) | INTRAMUSCULAR | Status: DC | PRN

## 2024-02-28 MED ORDER — ORAL CARE MOUTH RINSE: 15.0000 mL | Freq: Once | OROMUCOSAL | Status: AC

## 2024-02-28 MED ORDER — FENTANYL CITRATE (PF) 100 MCG/2ML IJ SOLN: 25.0000 ug | INTRAMUSCULAR | Status: DC | PRN

## 2024-02-28 MED ORDER — EPINEPHRINE PF 1 MG/ML IJ SOLN
INTRAMUSCULAR | Status: AC
  Filled 2024-02-28: qty 4

## 2024-02-28 NOTE — Progress Notes (Signed)
 Patient stable Had hemodialysis today Plan implant removal with antibiotic spacer We will use TXA as well as DDAVP . Patient has been off Plavix  and subcu heparin  for 2 days All questions answered

## 2024-02-28 NOTE — Progress Notes (Signed)
" °   02/28/24 1207  Vitals  Temp 97.6 F (36.4 C)  Pulse Rate 85  Resp 13  BP (!) 126/54  SpO2 100 %  O2 Device Room Air  Weight 87 kg  Type of Weight Post-Dialysis  Oxygen Therapy  Patient Activity (if Appropriate) In bed  Pulse Oximetry Type Continuous  Oximetry Probe Site Changed No  Post Treatment  Dialyzer Clearance Lightly streaked  Hemodialysis Intake (mL) 0 mL  Liters Processed 84  Fluid Removed (mL) 200 mL  Tolerated HD Treatment Yes  AVG/AVF Arterial Site Held (minutes) 10 minutes  AVG/AVF Venous Site Held (minutes) 10 minutes   Received patient in bed to unit.  Alert and oriented.  Informed consent signed and in chart.   TX duration:3.5  Patient tolerated well.  Transported back to the room  Alert, without acute distress.  Hand-off given to patient's nurse.   Access used: RLAVF Access issues: no complications  Total UF removed: 240ml---nephrology is aware Medication(s) given: maxipine iv---vancomycin  iv--mididrine 10mg  po x 1   [name] "

## 2024-02-28 NOTE — Brief Op Note (Signed)
" ° °  02/28/2024  5:24 PM  PATIENT:  Carl Curls Sr.  64 y.o. male  PRE-OPERATIVE DIAGNOSIS: Right prosthetic shoulder joint infection  POST-OPERATIVE DIAGNOSIS: Same  PROCEDURE:  Procedures: RIGHT SHOULDER ARTHROSCOPIC WASHOUT AND PLACEMENT OF ANTIBIOTIC BEADS  SURGEON:  Surgeon(s): [name] Cordella Hamilton, MD  ASSISTANT: Magnant PA  ANESTHESIA:   general  EBL: 3 ml    Total I/O In: 550 [I.V.:200; IV Piggyback:350] Out: 215 [Other:200; Blood:15]  BLOOD ADMINISTERED: none  DRAINS: none   LOCAL MEDICATIONS USED:  none  SPECIMEN:  No Specimen  COUNTS:  YES  TOURNIQUET:  * No tourniquets in log *  DICTATION: .Other Dictation: Dictation Number [id number]  PLAN OF CARE: Admit to inpatient   PATIENT DISPOSITION:  PACU - hemodynamically stable              "

## 2024-02-28 NOTE — Anesthesia Procedure Notes (Signed)
 Anesthesia Regional Block: Interscalene brachial plexus block   Pre-Anesthetic Checklist: , timeout performed,  Correct Patient, Correct Site, Correct Laterality,  Correct Procedure, Correct Position, site marked,  Risks and benefits discussed,  Surgical consent,  Pre-op evaluation,  At surgeon's request and post-op pain management  Laterality: Right  Prep: chloraprep       Needles:  Injection technique: Single-shot  Needle Type: Stimiplex     Needle Length: 9cm  Needle Gauge: 21     Additional Needles:   Procedures:,,,, ultrasound used (permanent image in chart),,    Narrative:  Start time: 02/28/2024 3:17 PM End time: 02/28/2024 3:22 PM Injection made incrementally with aspirations every 5 mL.  Performed by: Personally  Anesthesiologist: Cleotilde Butler Dade, MD

## 2024-02-28 NOTE — Anesthesia Preprocedure Evaluation (Signed)
"                                    Anesthesia Evaluation  Patient identified by MRN, date of birth, ID band Patient awake    Reviewed: Allergy & Precautions, H&P , NPO status , Patient's Chart, lab work & pertinent test results  Airway Mallampati: II  TM Distance: >3 FB Neck ROM: Full    Dental  (+) Dental Advisory Given   Pulmonary sleep apnea , former smoker   Pulmonary exam normal breath sounds clear to auscultation       Cardiovascular hypertension, negative cardio ROS Normal cardiovascular exam Rhythm:Regular Rate:Normal     Neuro/Psych  Headaches, Seizures -,   Anxiety Depression    TIACVA  negative psych ROS   GI/Hepatic Neg liver ROS,GERD  ,,  Endo/Other  diabetes    Renal/GU ESRF and DialysisRenal disease  negative genitourinary   Musculoskeletal  (+) Arthritis ,    Abdominal   Peds negative pediatric ROS (+)  Hematology  (+) Blood dyscrasia, anemia   Anesthesia Other Findings   Reproductive/Obstetrics negative OB ROS                              Anesthesia Physical Anesthesia Plan  ASA: 4  Anesthesia Plan: General   Post-op Pain Management: Regional block* and Tylenol  PO (pre-op)*   Induction: Intravenous  PONV Risk Score and Plan: 2 and Ondansetron , Midazolam  and Treatment may vary due to age or medical condition  Airway Management Planned: Oral ETT  Additional Equipment:   Intra-op Plan:   Post-operative Plan: Extubation in OR  Informed Consent: I have reviewed the patients History and Physical, chart, labs and discussed the procedure including the risks, benefits and alternatives for the proposed anesthesia with the patient or authorized representative who has indicated his/her understanding and acceptance.     Dental advisory given  Plan Discussed with: CRNA  Anesthesia Plan Comments:         Anesthesia Quick Evaluation  "

## 2024-02-28 NOTE — Op Note (Signed)
 [name], BEBOUT MEDICAL RECORD NO: [id number] ACCOUNT NO: [id number] DATE OF BIRTH: 11-21-1960 FACILITY: [facility] LOCATION: [facility] PHYSICIAN: Cordella RAMAN. [name], MD  Operative Report   DATE OF PROCEDURE: 02/28/2024  PREOPERATIVE DIAGNOSIS:  Right prosthetic shoulder joint infection.  POSTOPERATIVE DIAGNOSIS:  Right prosthetic shoulder joint infection.  PROCEDURE:  Arthroscopic I and D with placement of antibiotic beads and drain in right shoulder.  SURGEON:  Cordella RAMAN. [name], MD  ASSISTANT:  Herlene Calix, PA  INDICATIONS:  The patient is a 64 year old patient who presented to the hospital with right knee effusion and pain.  Was noted to have septic arthritis of the right knee which was washed out about a week ago by one of my partners.  Was having minimal  shoulder symptoms but tap of the shoulder did show significant white blood cell count.  That prosthetic is loose radiographically and the patient presents now for operative management after explanation of risk and benefits.  DESCRIPTION OF PROCEDURE:  The patient was brought to the operating room where general anesthetic was induced.  Perioperative antibiotics were continued.  Right shoulder was prescrubbed with alcohol and Betadine , allowed to air dry, prepped with DuraPrep  solution, and draped in the standard sterile manner.  Ioban was used to seal the operative field and cover the axilla.  Timeout was called.  Posterior portal was created.  Anterior portal created under direct visualization.  ArthroCare wand was utilized  to debride soft tissue around the glenosphere as well as around the proximal humerus.  Sutures which were present were harvested arthroscopically and sent for culture.  Following extensive synovectomy and debridement, the anterior portal was used to  irrigate the shoulder joint with 10 liters of irrigating solution.  Following this, antibiotic beads were placed which were composed of vancomycin  and gentamicin .  They  were placed arthroscopically into the anterior portion of the glenohumeral joint and  implant.  After this, the portals were closed using 2-0 Vicryl and 3-0 nylon.  We did place a drain through a separate lateral portal which we will keep in for 1-2 days and then pull the drain.  The arthroscopic washout was chosen over implant exchange  and placement of antibiotic spacer based on his low hemoglobin and Jehovah's Witness status.  The patient was transferred to the recovery room in stable condition.   Luke's assistance was required for opening, closing, antibiotic bead management and production as well as limb positioning.  His assistance was of medical necessity.    MUK D: 02/28/2024 5:29:01 pm T: 02/28/2024 8:27:00 pm  JOB: [id number]/ [id number]

## 2024-02-28 NOTE — Progress Notes (Signed)
 [facility]  PROGRESS NOTE  Carl Darnell Sr. FMW:[id number] DOB: 1960-07-26 DOA: 02/08/2024 PCP: [name], MD   Brief HPI:   64 year old with past medical history significant for ESRD on hemodialysis MWF, PVD s/p angioplasty right S FA October 2025 for ischemic rest pain as well as C4 spinal fracture April 2025 with incomplete spinal cord injury requiring C4 corpectomy, fusion and interbody cage by Dr. [name], wheelchair-bound for a time but progressed by Agurs to walking with a cane and driving, presented with right arm and leg weakness, fall as well as posterior neck pain, fever, ear drainage.   Admitted started on antibiotics, neurosurgery, neurology orthopedic ENT and nephrology consulted.   Diagnosed with otitis media and ruptured eardrum.  Right-sided weakness believed to be recrudescence of old spinal cord injury symptoms in the setting of infection.   He underwent Arthrocentesis of right knee, synovial fluid consistent with septic arthritis, underwent irrigation and debridement by Dr. [name] on 1/9. ID consulted recommend 4 weeks of IV antibiotics with HD> Vancomycin  and cefepime . End date 03/23/2024.   Spike fever 1/12 overnight. ID will follow up on patient today. ID also recommend Ortho evaluation for fluid collection of Right Shoulder. Ortho will consult IR for aspiration.       Assessment/Plan:   Sepsis due to otitis media with suspected TM perforation, right knee septic arthritis - Met sepsis criteria on admission; sepsis physiology has resolved - Seen by ENT, underwent CT of temporal bone without evidence of coalescent mastoiditis, bone lesion or abscess - Completed 10 days of antibiotics for otitis media  -needs follow up with ENT on January 5 -- 12th. I sent message through secure chat to Dr [name].  - Treated with  Ciprodex  drops, ENT recommend it for 14 days. Completed.  -Seen by ENT Dr. Penne [name], recommend to follow-up with him after discharge. -He  anticipates aural toilet in the office with microscope and updated hearing test.     Right-sided weakness History of cervical decompression corpectomy C4 by [name] April 2025 Remote left occipital infarct - There was some concern for recurrent spinal cord injury, neurosurgery and neurology were consulted.   -A spinal MRI showed no new cord injury. - It was thought that his symptoms were recrudescence of old  symptoms due to  acute infection.    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  Had knee aspiration and injection on  1/06  -knee synovial fluid, no growth to date.  -WBC Synovial fluid notice to be 71,000.  -Treated with Augmentin  and Linezolid .  -ID consulted , recommend IV antibiotics with HD for 4 weeks.  Currently on vancomycin  and cefepime ,  Monday Wednesday and Friday with hemodialysis. Ortho consulted for evaluation of fluid collection right shoulder in setting of persistent fever.  -IR was consulted and small amount of fluid in soft tissue anterior and posterior to right shoulder aspirated. - Fluid culture showed no growth; WBC count 93,000 -Seen by Dr. Glendia today, plan for removal of implant and antibiotic spacer placement.  May or may not be able to reimplant in future.    -Peripheral vascular disease s/p angioplasty October 2025; - Continue aspirin  Lipitor and Plavix    ESRD on hemodialysis: MWF - Nephrology following.   -Lokelma   daily  Continue with HD.    Chronic hypotension, asymptomatic -Continue midodrine  Refuse meds at times.  ECHO normal EF. 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  Per cardiology small mobile echodensity on the ventricular aspect of the aortic valve is likely degenerative changes of the valve.  Also blood cultures has been  negative. -spike fever, repeat blood cultures.  -ID informed of echo finding.  -No plan to do TEE as this would not change management as per ID.  He would not be candidate for CT surgery and plan to get 6 weeks of vancomycin  and cefepime .   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 He would not accept blood transfusion do to religious believes.  Monitor hemoglobin        DVT prophylaxis: Heparin   Medications     (feeding supplement) PROSource Plus  30 mL Oral BID BM   aspirin  EC  81 mg Oral Daily   atorvastatin   10 mg Oral Daily   bupivacaine (PF)  10 mL Infiltration Once   chlorhexidine   60 mL Topical Once   Chlorhexidine  Gluconate Cloth  6 each Topical Q0600   Chlorhexidine  Gluconate Cloth  6 each Topical Q0600   cinacalcet   120 mg Oral Q breakfast   darbepoetin (ARANESP ) injection - DIALYSIS  100 mcg Subcutaneous Q Fri-1800   feeding supplement (KATE FARMS STANDARD 1.4) Liquid  325 mL Oral Daily   ferrous sulfate   325 mg Oral Q breakfast   folic acid   1 mg Oral Daily   gabapentin   300 mg Oral QHS   insulin  aspart  0-6 Units Subcutaneous TID WC   lidocaine   1 patch Transdermal Q24H   lidocaine   1 patch Transdermal Q24H   midodrine   10 mg Oral TID WC   multivitamin  1 tablet Oral QHS   pantoprazole   40 mg Oral BID AC   povidone-iodine   2 Application Topical Once   psyllium  1 packet Oral Daily   sevelamer  carbonate  2,400 mg Oral TID WC   sodium chloride  flush  3 mL Intravenous Q12H     Data Reviewed:   CBG:  Recent Labs  Lab 02/27/24 0844 02/27/24 1415 02/27/24 1603 02/27/24 2057 02/28/24 1247  GLUCAP 74 123* 132* 127* 83    SpO2: 99 % O2 Flow Rate (L/min): 2 L/min    Vitals:   02/28/24 1201 02/28/24 1203 02/28/24 1207 02/28/24 1254  BP: 114/60 114/60 (!) 126/54 109/69  Pulse: 81 83 85 86   Resp: 11 12 13 15   Temp:   97.6 F (36.4 C) 98 F (36.7 C)  TempSrc:    Oral  SpO2: 98% 98% 100% 99%  Weight:   87 kg   Height:          Data Reviewed:  Basic Metabolic Panel: Recent Labs  Lab 02/24/24 0506 02/25/24 0531 02/26/24 0304 02/27/24 0301 02/28/24 0316  NA 134* 136 133* 133* 133*  K 4.5 4.6 5.0 4.2 4.6  CL 89* 91* 89* 91* 90*  CO2 24 28 27 28 28   GLUCOSE 84 82 116* 87 96  BUN 72* 45* 60* 33* 53*  CREATININE 9.23* 6.63* 8.43* 5.80* 7.70*  CALCIUM  8.3* 8.7* 8.2* 8.6* 8.7*  PHOS 6.7* 4.7* 5.5* 4.3 5.2*    CBC: Recent Labs  Lab 02/23/24 1053 02/24/24 0757 02/27/24 0301 02/28/24 0731  WBC 9.9 9.5 6.2 8.0  NEUTROABS  --   --   --  5.6  HGB 7.9* 8.1* 7.8* 7.3*  HCT 24.7* 25.3* 24.3* 22.3*  MCV 90.8 91.0 91.0 88.5  PLT 474* 478* 335 350    LFT Recent Labs  Lab 02/24/24 0506 02/25/24 0531 02/26/24 0304 02/27/24 0301 02/28/24 0316  AST  --   --   --  18  --   ALT  --   --   --  <5  --   ALKPHOS  --   --   --  98  --   BILITOT  --   --   --  0.4  --   PROT  --   --   --  7.8  --   ALBUMIN  3.0* 3.1* 2.9* 3.0* 3.1*     Antibiotics: Anti-infectives (From admission, onward)    Start     Dose/Rate Route Frequency Ordered Stop   02/27/24 0745  ceFAZolin  (ANCEF ) IVPB 2g/100 mL premix  Status:  Discontinued        2 g 200 mL/hr over 30 Minutes Intravenous On call to O.R. 02/27/24 0647 02/28/24 0559   02/26/24 1200  vancomycin  (VANCOCIN ) IVPB 1000 mg/200 mL premix        1,000 mg 200 mL/hr over 60 Minutes Intravenous Every M-W-F (Hemodialysis) 02/24/24 0951 03/23/24 2359   02/24/24 1500  vancomycin  (VANCOREADY) IVPB 2000 mg/400 mL        2,000 mg 200 mL/hr over 120 Minutes Intravenous  Once 02/24/24 0945 02/24/24 1854   02/24/24 1200  ceFEPIme  (MAXIPIME ) 2 g in sodium chloride  0.9 % 100 mL IVPB        2 g 200 mL/hr over 30 Minutes Intravenous Every M-W-F (Hemodialysis) 02/24/24 0945 03/23/24 2359   02/24/24 0000  ceFEPime  (MAXIPIME ) IVPB         2 g Intravenous Every M-W-F (Hemodialysis) 02/24/24 1121 03/23/24 2359   02/24/24 0000  vancomycin  IVPB        1,000 mg Intravenous Every M-W-F (Hemodialysis) 02/24/24 1121 03/23/24 2359   02/22/24 1000  amoxicillin -clavulanate (AUGMENTIN ) 500-125 MG per tablet 1 tablet  Status:  Discontinued        1 tablet Oral 2 times daily 02/22/24 0935 02/24/24 0945   02/22/24 0600  ceFAZolin  (ANCEF ) IVPB 2g/100 mL premix  Status:  Discontinued        2 g 200 mL/hr over 30 Minutes Intravenous On call to O.R. 02/21/24 1200 02/21/24 1605   02/21/24 2200  amoxicillin -clavulanate (AUGMENTIN ) 500-125 MG per tablet 1 tablet  Status:  Discontinued        1 tablet Oral Daily at bedtime 02/20/24 1042 02/22/24 0935   02/20/24 1130  linezolid  (ZYVOX ) tablet 600 mg  Status:  Discontinued        600 mg Oral Every 12 hours 02/20/24 1037 02/24/24 0945   02/20/24 1130  amoxicillin -clavulanate (AUGMENTIN ) 875-125 MG per tablet 1 tablet  Status:  Discontinued        1 tablet Oral Every 12 hours 02/20/24 1038 02/20/24 1041   02/20/24 1130  amoxicillin -clavulanate (AUGMENTIN ) 500-125 MG per tablet 1 tablet        1 tablet Oral  Once 02/20/24 1042 02/20/24 1329   02/19/24 1545  amoxicillin -clavulanate (AUGMENTIN ) 500-125 MG per tablet 1 tablet  Status:  Discontinued        1 tablet Oral  Once 02/19/24 1527 02/20/24 1038   02/17/24 0615  amoxicillin -clavulanate (AUGMENTIN ) 500-125 MG per tablet 1 tablet        1 tablet Oral Every 12 hours 02/17/24  9443 02/18/24 2151   02/09/24 0600  piperacillin -tazobactam (ZOSYN ) IVPB 2.25 g  Status:  Discontinued        2.25 g 100 mL/hr over 30 Minutes Intravenous Every 8 hours 02/09/24 0135 02/17/24 0556   02/08/24 2200  amoxicillin -clavulanate (AUGMENTIN ) 875-125 MG per tablet 1 tablet  Status:  Discontinued        1 tablet Oral Every 12 hours 02/08/24 2104 02/09/24 0135   02/08/24 2200  piperacillin -tazobactam (ZOSYN ) IVPB 3.375 g        3.375 g 100 mL/hr over 30 Minutes Intravenous   Once 02/08/24 2153 02/08/24 2314   02/08/24 0000  amoxicillin -clavulanate (AUGMENTIN ) 875-125 MG tablet  Status:  Discontinued        1 tablet Oral Every 12 hours 02/08/24 1908 02/24/24         CONSULTS nephrology, ID  Code Status: Full code  Family Communication: No family at bedside     Subjective   Patient seen and examined, no new complaints.   Objective    Physical Examination:  General-appears in no acute distress Heart-S1-S2, regular, no murmur auscultated Lungs-clear to auscultation bilaterally, no wheezing or crackles auscultated Abdomen-soft, nontender, no organomegaly Extremities-no edema in the lower extremities Neuro-alert, oriented x3, no focal deficit noted           Tieasha Larsen S Ermin Parisien   [facility] If 7PM-7AM, please contact night-coverage at [email or web address], Office  [phone]   02/28/2024, 2:04 PM  LOS: 17 days

## 2024-02-28 NOTE — Progress Notes (Signed)
 Patient with right shoulder infection of TSA Plan was for implant removal and antibiotic spacer placement However currently his hemoglobin is 7.3 and as a [employer] he is refusing all blood products Implant removal and placement of antibiotic spacer not feasible with hemoglobin at that level We will do arthroscopic washout with placement of antibiotic beads until his hemoglobin increases and then we can consider more extensive surgery in the future. It is explained to the patient that it is less than 50% chance that we can eradicate the infection with this current procedure but has a close space infection we do need to attempt to wash it out in a way that will minimize blood loss. Patient understands and realizes that this is not an optimal surgical solution but it is in line with his strongly held believes to avoid blood products.

## 2024-02-28 NOTE — Procedures (Signed)
 I was present at this dialysis session. I have reviewed the session itself and made appropriate changes.   Filed Weights   02/27/24 0500 02/28/24 0551 02/28/24 0815  Weight: 82.9 kg 87.4 kg 87.2 kg    Recent Labs  Lab 02/28/24 0316  NA 133*  K 4.6  CL 90*  CO2 28  GLUCOSE 96  BUN 53*  CREATININE 7.70*  CALCIUM  8.7*  PHOS 5.2*    Recent Labs  Lab 02/24/24 0757 02/27/24 0301 02/28/24 0731  WBC 9.5 6.2 8.0  NEUTROABS  --   --  5.6  HGB 8.1* 7.8* 7.3*  HCT 25.3* 24.3* 22.3*  MCV 91.0 91.0 88.5  PLT 478* 335 350    Scheduled Meds:  (feeding supplement) PROSource Plus  30 mL Oral BID BM   aspirin  EC  81 mg Oral Daily   atorvastatin   10 mg Oral Daily   bupivacaine (PF)  10 mL Infiltration Once   chlorhexidine   60 mL Topical Once   Chlorhexidine  Gluconate Cloth  6 each Topical Q0600   Chlorhexidine  Gluconate Cloth  6 each Topical Q0600   cinacalcet   120 mg Oral Q breakfast   darbepoetin (ARANESP ) injection - DIALYSIS  100 mcg Subcutaneous Q Fri-1800   feeding supplement (KATE FARMS STANDARD 1.4) Liquid  325 mL Oral Daily   ferrous sulfate   325 mg Oral Q breakfast   folic acid   1 mg Oral Daily   gabapentin   300 mg Oral QHS   insulin  aspart  0-6 Units Subcutaneous TID WC   lidocaine   1 patch Transdermal Q24H   lidocaine   1 patch Transdermal Q24H   midodrine   10 mg Oral TID WC   multivitamin  1 tablet Oral QHS   pantoprazole   40 mg Oral BID AC   povidone-iodine   2 Application Topical Once   psyllium  1 packet Oral Daily   sevelamer  carbonate  2,400 mg Oral TID WC   sodium chloride  flush  3 mL Intravenous Q12H   Continuous Infusions:  ceFEPime  (MAXIPIME ) IV 2 g (02/26/24 1822)   vancomycin  Stopped (02/26/24 1214)   PRN Meds:.acetaminophen  **OR** acetaminophen , cyclobenzaprine , diphenhydrAMINE , melatonin, midodrine , oxyCODONE -acetaminophen    Ephriam Stank, MD [facility] 02/28/2024, 10:23 AM

## 2024-02-28 NOTE — Progress Notes (Signed)
 " [facility] Progress Note    Assessment/ Plan:   ESRD: on HD MWF. Pt requests 1st shift. Wants to hold off on recliner HD for now. Next HD Monday -vanc+cefepime  with HD  Volume/chronic hypotension: HD with midodrine  support, UF as tolerated  Anemia of ESRD: declines IV alb/ prbc's, hold off on IV Fe, increased ESA dose (hgb 7.3)  Hyperkalemia: persistent, mild, had been on lokelma --on hold for now, can resume if needed, k remains wnl  Septic arthritis R knee and right shoulder: SP I&D 1/09 per ortho, refused IV so getting po abx--per primary. S/p fluid collection of right shoulder 1/13, highly infectious. ID following, going to OR today for right shoulder implant removal with abx spacer placement  Otitis media w/ TM perforation: s/p course of abx, ENT has seen  R sided weakness: main c/o on admission, CVA w/u was negative  Secondary hyperparathyroidism: phos at goal  Dialysis Orders: MWF NW 4h  B400  87.3kg  2K bath  AVF   Heparin  3000 ESA: Mircera 50 q 2 wks (last 12/15) BMM: Calcitriol  2 mcg PO q HD, cinacalcet  120 mg daily   Subjective:   Patient seen and examined on dialysis, no complaints.   Objective:   BP (!) 95/52   Pulse 81   Temp 98.8 F (37.1 C)   Resp 12   Ht 6' 1 (1.854 m)   Wt 87.2 kg   SpO2 99%   BMI 25.36 kg/m  No intake or output data in the 24 hours ending 02/28/24 1024  Weight change: -1.3 kg  Physical Exam: Gen: NAD CVS: RRR Resp: unlabored, normal wob Abd: soft Ext: right knee dressing in place, no edema LE's Neuro: awake, alert Dialysis access: RUE AVF  Imaging: No results found.   Labs: BMET Recent Labs  Lab 02/22/24 0534 02/23/24 0557 02/24/24 0506 02/25/24 0531 02/26/24 0304 02/27/24 0301 02/28/24 0316  NA 133* 135 134* 136 133* 133* 133*  K 5.7* 4.6 4.5 4.6 5.0 4.2 4.6  CL 92* 91* 89* 91* 89* 91* 90*  CO2 26 26 24 28 27 28 28   GLUCOSE 105* 83 84 82 116* 87 96  BUN 42* 61* 72* 45* 60* 33* 53*   CREATININE 5.37* 7.43* 9.23* 6.63* 8.43* 5.80* 7.70*  CALCIUM  8.9 8.6* 8.3* 8.7* 8.2* 8.6* 8.7*  PHOS 5.4* 6.5* 6.7* 4.7* 5.5* 4.3 5.2*   CBC Recent Labs  Lab 02/23/24 1053 02/24/24 0757 02/27/24 0301 02/28/24 0731  WBC 9.9 9.5 6.2 8.0  NEUTROABS  --   --   --  5.6  HGB 7.9* 8.1* 7.8* 7.3*  HCT 24.7* 25.3* 24.3* 22.3*  MCV 90.8 91.0 91.0 88.5  PLT 474* 478* 335 350    Medications:     (feeding supplement) PROSource Plus  30 mL Oral BID BM   aspirin  EC  81 mg Oral Daily   atorvastatin   10 mg Oral Daily   bupivacaine (PF)  10 mL Infiltration Once   chlorhexidine   60 mL Topical Once   Chlorhexidine  Gluconate Cloth  6 each Topical Q0600   Chlorhexidine  Gluconate Cloth  6 each Topical Q0600   cinacalcet   120 mg Oral Q breakfast   darbepoetin (ARANESP ) injection - DIALYSIS  100 mcg Subcutaneous Q Fri-1800   feeding supplement (KATE FARMS STANDARD 1.4) Liquid  325 mL Oral Daily   ferrous sulfate   325 mg Oral Q breakfast   folic acid   1 mg Oral Daily   gabapentin   300 mg Oral QHS  insulin  aspart  0-6 Units Subcutaneous TID WC   lidocaine   1 patch Transdermal Q24H   lidocaine   1 patch Transdermal Q24H   midodrine   10 mg Oral TID WC   multivitamin  1 tablet Oral QHS   pantoprazole   40 mg Oral BID AC   povidone-iodine   2 Application Topical Once   psyllium  1 packet Oral Daily   sevelamer  carbonate  2,400 mg Oral TID WC   sodium chloride  flush  3 mL Intravenous Q12H      Ephriam Stank, MD [facility] 02/28/2024, 10:24 AM   "

## 2024-02-28 NOTE — Progress Notes (Signed)
 ENT PROGRESS: HPI: Carl Odoherty Sr. is an 64 y.o. male  12/28. He reports decreased hearing on the left side that started one week ago. It has been draining and feels like it is muffled or under water . He denies any issues with the right ear. He denies tenderness behind his ears and facial weakness. He reports acute weakness to the right upper extremity and right lower extremity.   1/14: PSE. Drainage from ears has resolved. Hearing has improved. No pain in ears. No facial weakness. No pain behind ears.    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    Parathyroid  abnormality    ectopic parathyroid  gland   Presence of arteriovenous fistula for hemodialysis, primary    RUE PER PT RLE   Refusal of blood product    NO WHOLE BLOOD PROUCTS   Renal cell carcinoma (HCC)    s/p hand assisted laparoscopic bilateral nephrectomies 11/29/17, + RCC left   Secondary hyperparathyroidism    Seizures (HCC)    one episode in past, due to elevated Potassium 08/02/20- at least 4 years ago   Sleep apnea    doesn't use CPAP anymore since weight loss   Stroke [facility])    no residual    Past Surgical History:  Procedure Laterality Date   ABDOMINAL AORTOGRAM W/LOWER EXTREMITY N/A 12/09/2023   Procedure: ABDOMINAL AORTOGRAM W/LOWER EXTREMITY;  Surgeon: Sheree Penne Bruckner, MD;  Location: [facility] INVASIVE CV LAB;  Service: Cardiovascular;  Laterality: N/A;   ANTERIOR CERVICAL  CORPECTOMY N/A 05/25/2023   Procedure: ANTERIOR CERVICAL CORPECTOMY CERVICAL FOUR, CERVICAL THREE - CERVICAL FIVE FUSION;  Surgeon: [name] Shove, MD;  Location: [facility] OR;  Service: Neurosurgery;  Laterality: N/A;   AV FISTULA PLACEMENT Right    right arm   BIOPSY  08/01/2019   Procedure: BIOPSY;  Surgeon: Kristie Lamprey, MD;  Location: [facility] ENDOSCOPY;  Service: Endoscopy;;   BIOPSY  11/18/2020   Procedure: BIOPSY;  Surgeon: Stacia Glendia BRAVO, MD;  Location: [facility] ENDOSCOPY;  Service: Gastroenterology;;   BIOPSY  09/13/2021   Procedure: BIOPSY;  Surgeon: Wilhelmenia Aloha Raddle., MD;  Location: [facility] ENDOSCOPY;  Service: Gastroenterology;;   CATARACT EXTRACTION W/ INTRAOCULAR LENS  IMPLANT, BILATERAL     COLON SURGERY     COLONOSCOPY N/A 08/04/2015   Procedure: COLONOSCOPY;  Surgeon: Gordy CHRISTELLA Starch, MD;  Location: [facility] ENDOSCOPY;  Service: Endoscopy;  Laterality: N/A;   COLONOSCOPY  2017   JMP@ [facility]-good prep-mass -recall 1 yr   COLONOSCOPY N/A 09/13/2021   Procedure: COLONOSCOPY;  Surgeon: Mansouraty, Aloha Raddle., MD;  Location: [facility] ENDOSCOPY;  Service: Gastroenterology;  Laterality: N/A;   COLONOSCOPY WITH PROPOFOL  N/A 11/25/2020   Procedure: COLONOSCOPY WITH PROPOFOL ;  Surgeon: Federico Rosario BROCKS, MD;  Location: [facility] ENDOSCOPY;  Service: Gastroenterology;  Laterality: N/A;  COLONOSCOPY WITH PROPOFOL  N/A 09/23/2021   Procedure: COLONOSCOPY WITH PROPOFOL ;  Surgeon: San Sandor GAILS, DO;  Location: [facility] ENDOSCOPY;  Service: Gastroenterology;  Laterality: N/A;   ENTEROSCOPY N/A 09/23/2021   Procedure: ENTEROSCOPY;  Surgeon: San Sandor GAILS, DO;  Location: [facility] ENDOSCOPY;  Service: Gastroenterology;  Laterality: N/A;  Will place order for video capsule study as we may opt to place it during procedure   ESOPHAGOGASTRODUODENOSCOPY N/A 08/01/2019   Procedure: ESOPHAGOGASTRODUODENOSCOPY (EGD);  Surgeon: Kristie Lamprey, MD;  Location: [facility] ENDOSCOPY;  Service: Endoscopy;  Laterality: N/A;   ESOPHAGOGASTRODUODENOSCOPY N/A 11/18/2020    Procedure: ESOPHAGOGASTRODUODENOSCOPY (EGD);  Surgeon: Stacia Glendia BRAVO, MD;  Location: [facility] ENDOSCOPY;  Service: Gastroenterology;  Laterality: N/A;   ESOPHAGOGASTRODUODENOSCOPY (EGD) WITH PROPOFOL  N/A 08/04/2019   Procedure: ESOPHAGOGASTRODUODENOSCOPY (EGD) WITH PROPOFOL ;  Surgeon: Leigh Elspeth SQUIBB, MD;  Location: [facility] ENDOSCOPY;  Service: Gastroenterology;  Laterality: N/A;   ESOPHAGOGASTRODUODENOSCOPY (EGD) WITH PROPOFOL  N/A 09/13/2021   Procedure: ESOPHAGOGASTRODUODENOSCOPY (EGD) WITH PROPOFOL ;  Surgeon: Wilhelmenia Aloha Raddle., MD;  Location: [facility] ENDOSCOPY;  Service: Gastroenterology;  Laterality: N/A;   GIVENS CAPSULE STUDY N/A 09/23/2021   Procedure: GIVENS CAPSULE STUDY;  Surgeon: San Sandor GAILS, DO;  Location: [facility] ENDOSCOPY;  Service: Gastroenterology;  Laterality: N/A;   graft left arm Left    for dialysis x 2. Removed   HOT HEMOSTASIS  11/18/2020   Procedure: HOT HEMOSTASIS (ARGON PLASMA COAGULATION/BICAP);  Surgeon: Stacia Glendia BRAVO, MD;  Location: [facility] ENDOSCOPY;  Service: Gastroenterology;;   HOT HEMOSTASIS N/A 09/23/2021   Procedure: HOT HEMOSTASIS (ARGON PLASMA COAGULATION/BICAP);  Surgeon: San Sandor GAILS, DO;  Location: [facility] ENDOSCOPY;  Service: Gastroenterology;  Laterality: N/A;   INSERTION OF DIALYSIS CATHETER     Rt chest   IR US  GUIDE BX ASP/DRAIN  02/25/2024   KNEE ARTHROSCOPY Right 02/21/2024   Procedure: ARTHROSCOPY, KNEE;  Surgeon: [name] Kay HERO, MD;  Location: [facility] OR;  Service: Orthopedics;  Laterality: Right;   LAPAROSCOPIC RIGHT COLECTOMY N/A 08/05/2015   Procedure: LAPAROSCOPIC RIGHT COLECTOMY- ASCENDING;  Surgeon: Jina Nephew, MD;  Location: [facility] OR;  Service: General;  Laterality: N/A;   LOWER EXTREMITY ANGIOGRAPHY N/A 12/09/2023   Procedure: Lower Extremity Angiography;  Surgeon: Sheree Penne Bruckner, MD;  Location: [facility] INVASIVE CV LAB;  Service: Cardiovascular;  Laterality: N/A;   LOWER EXTREMITY INTERVENTION N/A 12/09/2023   Procedure: LOWER EXTREMITY  INTERVENTION;  Surgeon: Sheree Penne Bruckner, MD;  Location: [facility] INVASIVE CV LAB;  Service: Cardiovascular;  Laterality: N/A;   MASS EXCISION Left 05/28/2019   Procedure: EXCISION SOFT TISSUE MASS LEFT SHOULDER;  Surgeon: Eletha Boas, MD;  Location: [facility] ORS;  Service: General;  Laterality: Left;   NEPHRECTOMY Bilateral    PARATHYROIDECTOMY N/A 06/12/2016   Procedure: TOTAL PARATHYROIDECTOMY WITH AUTOTRANSPLANTATION TO LEFT FOREARM;  Surgeon: Boas Eletha, MD;  Location: [facility] OR;  Service: General;  Laterality: N/A;   PARATHYROIDECTOMY N/A 10/23/2016   Procedure: PARATHYROIDECTOMY;  Surgeon: Eletha Boas, MD;  Location: [facility] OR;  Service: General;  Laterality: N/A;   REVERSE SHOULDER ARTHROPLASTY Right 08/24/2020   Procedure: REVERSE SHOULDER ARTHROPLASTY;  Surgeon: Cristy Bonner DASEN, MD;  Location: [facility] OR;  Service: Orthopedics;  Laterality: Right;   REVISON OF ARTERIOVENOUS FISTULA Right 07/16/2017   Procedure: REVISION OF ARTERIOVENOUS FISTULA  Right ARM;  Surgeon: Sheree Penne Bruckner, MD;  Location: [facility] OR;  Service: Vascular;  Laterality: Right;   TOTAL HIP ARTHROPLASTY Left 11/14/2020   Procedure: LEFT TOTAL HIP ARTHROPLASTY ANTERIOR APPROACH;  Surgeon: [name] Kay HERO, MD;  Location: [facility]  OR;  Service: Orthopedics;  Laterality: Left;   UPPER GASTROINTESTINAL ENDOSCOPY  2021   @ [facility]    Family History  Problem Relation Age of Onset   Diabetes Father    Stroke Father    Hypertension Father    Uterine cancer Mother    Lupus Sister    Stroke Sister    Hypertension Sister    Anuerysm Brother        brain   Colon cancer Neg Hx    Esophageal cancer Neg Hx    Stomach cancer Neg Hx    Pancreatic cancer Neg Hx    Liver disease Neg Hx    Colon polyps Neg Hx    Rectal cancer Neg Hx     Social History:  reports that he quit smoking about 33 years ago. His smoking use included cigarettes. He has never used smokeless tobacco. He reports that he does not currently use alcohol after a past usage  of about 1.0 standard drink of alcohol per week. He reports that he does not currently use drugs after having used the following drugs: Marijuana.  Allergies: Allergies[1]    Results for orders placed or performed during the hospital encounter of 02/08/24 (from the past 48 hours)  Glucose, capillary     Status: Abnormal   Collection Time: 02/26/24  1:09 PM  Result Value Ref Range   Glucose-Capillary 104 (H) 70 - 99 mg/dL    Comment: Glucose reference range applies only to samples taken after fasting for at least 8 hours.  Glucose, capillary     Status: None   Collection Time: 02/26/24  4:33 PM  Result Value Ref Range   Glucose-Capillary 90 70 - 99 mg/dL    Comment: Glucose reference range applies only to samples taken after fasting for at least 8 hours.   Comment 1 Document in Chart   Glucose, capillary     Status: None   Collection Time: 02/26/24 10:11 PM  Result Value Ref Range   Glucose-Capillary 94 70 - 99 mg/dL    Comment: Glucose reference range applies only to samples taken after fasting for at least 8 hours.  CBC     Status: Abnormal   Collection Time: 02/27/24  3:01 AM  Result Value Ref Range   WBC 6.2 4.0 - 10.5 K/uL   RBC 2.67 (L) 4.22 - 5.81 MIL/uL   Hemoglobin 7.8 (L) 13.0 - 17.0 g/dL   HCT 75.6 (L) 60.9 - 47.9 %   MCV 91.0 80.0 - 100.0 fL   MCH 29.2 26.0 - 34.0 pg   MCHC 32.1 30.0 - 36.0 g/dL   RDW 84.4 88.4 - 84.4 %   Platelets 335 150 - 400 K/uL   nRBC 0.0 0.0 - 0.2 %    Comment: Performed at [facility] Lab, [address] [place], [name]  Comprehensive metabolic panel     Status: Abnormal   Collection Time: 02/27/24  3:01 AM  Result Value Ref Range   Sodium 133 (L) 135 - 145 mmol/L   Potassium 4.2 3.5 - 5.1 mmol/L   Chloride 91 (L) 98 - 111 mmol/L   CO2 28 22 - 32 mmol/L   Glucose, Bld 87 70 - 99 mg/dL    Comment: Glucose reference range applies only to samples taken after fasting for at least 8 hours.   BUN 33 (H) 8 - 23 mg/dL    Creatinine, Ser 4.19 (H) 0.61 - 1.24 mg/dL   Calcium  8.6 (L)  8.9 - 10.3 mg/dL   Total Protein 7.8 6.5 - 8.1 g/dL   Albumin  3.0 (L) 3.5 - 5.0 g/dL   AST 18 15 - 41 U/L   ALT <5 0 - 44 U/L   Alkaline Phosphatase 98 38 - 126 U/L   Total Bilirubin 0.4 0.0 - 1.2 mg/dL   GFR, Estimated 10 (L) >60 mL/min    Comment: (NOTE) Calculated using the CKD-EPI Creatinine Equation (2021)    Anion gap 14 5 - 15    Comment: Performed at [facility], [address], [place], [name]  Phosphorus     Status: None   Collection Time: 02/27/24  3:01 AM  Result Value Ref Range   Phosphorus 4.3 2.5 - 4.6 mg/dL    Comment: Performed at [facility] Lab, [address] [place], [name]  Glucose, capillary     Status: None   Collection Time: 02/27/24  8:44 AM  Result Value Ref Range   Glucose-Capillary 74 70 - 99 mg/dL    Comment: Glucose reference range applies only to samples taken after fasting for at least 8 hours.  Glucose, capillary     Status: Abnormal   Collection Time: 02/27/24  2:15 PM  Result Value Ref Range   Glucose-Capillary 123 (H) 70 - 99 mg/dL    Comment: Glucose reference range applies only to samples taken after fasting for at least 8 hours.  Glucose, capillary     Status: Abnormal   Collection Time: 02/27/24  4:03 PM  Result Value Ref Range   Glucose-Capillary 132 (H) 70 - 99 mg/dL    Comment: Glucose reference range applies only to samples taken after fasting for at least 8 hours.  Glucose, capillary     Status: Abnormal   Collection Time: 02/27/24  8:57 PM  Result Value Ref Range   Glucose-Capillary 127 (H) 70 - 99 mg/dL    Comment: Glucose reference range applies only to samples taken after fasting for at least 8 hours.  Surgical pcr screen     Status: None   Collection Time: 02/28/24 12:50 AM   Specimen: Nasal Mucosa; Nasal Swab  Result Value Ref Range   MRSA, PCR NEGATIVE NEGATIVE   Staphylococcus aureus NEGATIVE NEGATIVE    Comment: (NOTE) The  Xpert SA Assay (FDA approved for NASAL specimens in patients 50 years of age and older), is one component of a comprehensive surveillance program. It is not intended to diagnose infection nor to guide or monitor treatment. Performed at [facility], [address] [place], [name]   Renal function panel     Status: Abnormal   Collection Time: 02/28/24  3:16 AM  Result Value Ref Range   Sodium 133 (L) 135 - 145 mmol/L   Potassium 4.6 3.5 - 5.1 mmol/L   Chloride 90 (L) 98 - 111 mmol/L   CO2 28 22 - 32 mmol/L   Glucose, Bld 96 70 - 99 mg/dL    Comment: Glucose reference range applies only to samples taken after fasting for at least 8 hours.   BUN 53 (H) 8 - 23 mg/dL   Creatinine, Ser 2.29 (H) 0.61 - 1.24 mg/dL   Calcium  8.7 (L) 8.9 - 10.3 mg/dL   Phosphorus 5.2 (H) 2.5 - 4.6 mg/dL   Albumin  3.1 (L) 3.5 - 5.0 g/dL   GFR, Estimated 7 (L) >60 mL/min    Comment: (NOTE) Calculated using the CKD-EPI Creatinine Equation (2021)  Anion gap 16 (H) 5 - 15    Comment: Performed at [facility], [address] [place], [name]  CBC with Differential/Platelet     Status: Abnormal   Collection Time: 02/28/24  7:31 AM  Result Value Ref Range   WBC 8.0 4.0 - 10.5 K/uL   RBC 2.52 (L) 4.22 - 5.81 MIL/uL   Hemoglobin 7.3 (L) 13.0 - 17.0 g/dL   HCT 77.6 (L) 60.9 - 47.9 %   MCV 88.5 80.0 - 100.0 fL   MCH 29.0 26.0 - 34.0 pg   MCHC 32.7 30.0 - 36.0 g/dL   RDW 84.5 88.4 - 84.4 %   Platelets 350 150 - 400 K/uL   nRBC 0.0 0.0 - 0.2 %   Neutrophils Relative % 70 %   Neutro Abs 5.6 1.7 - 7.7 K/uL   Lymphocytes Relative 10 %   Lymphs Abs 0.8 0.7 - 4.0 K/uL   Monocytes Relative 14 %   Monocytes Absolute 1.2 (H) 0.1 - 1.0 K/uL   Eosinophils Relative 4 %   Eosinophils Absolute 0.3 0.0 - 0.5 K/uL   Basophils Relative 1 %   Basophils Absolute 0.1 0.0 - 0.1 K/uL   Immature Granulocytes 1 %   Abs Immature Granulocytes 0.05 0.00 - 0.07 K/uL    Comment: Performed at  [facility] Lab, [address], [place], [name]    No results found.  ROS:ROS  Blood pressure 114/60, pulse 83, temperature 98.8 F (37.1 C), resp. rate 12, height 6' 1 (1.854 m), weight 87.2 kg, SpO2 98%.  PHYSICAL EXAM: General appearance - alert, well appearing, and in no distress  CONSTITUTIONAL: well developed, nourished, no distress and alert and oriented x 3 CARDIOVASCULAR: normal rate and regular rhythm PULMONARY/CHEST WALL: effort normal and no stridor, no stertor, no dysphonia HENT: Head : normocephalic and atraumatic Ears: Right ear:  cerumen impaction, narrow canal Left ear:   canal narrow, external ear normal, mild cerumen, no purulence  - no mastoid tenderness, erythema or edema bilaterally Nose: nose normal and no purulence Mouth/Throat:  Mouth: uvula midline and no oral lesions Throat: oropharynx clear and moist Mucous membranes: normal EYES: conjunctiva normal, EOM normal and PERRL NECK: supple, trachea normal and no thyromegaly or cervical LAD  Studies Reviewed:CT temporal bone.   Assessment/Plan: Improved Left suppurative otitis media with TM perforation vs improved left otitis externa - improved. No drainage, FN weakness, or mastoiditis sxs.  - CT scan reviewed no evidence of coalescent mastoiditis. No physical exam findings consistent with mastoiditis. + left mod mastoid efffusion. Right mild mastoid effusion. No tegmen dehiscence.   - no brain abscess on imaging  Upon further records review. Patient has been established with [facility] for over the last year.  He should follow up at my office or [facility] after discharge. I anticipate aural toilet in the office with a microscope and an updated hearing test.    Please reach out to me if he has any new ear drainage, pain or facial weakness.  Thank you for the consult.  Penne Croak 02/28/2024 12:14 PM Available on Haiku chat and [employer]  Department of Otolaryngology Contact  Info: Otolaryngology [employer] including questions about appointments or questions: [phone] - If after normal business hours (Monday-Friday after 5PM or Weekends/Holidays), please call same number and follow prompts for Patient Access Line. There is a physician on call for urgent matters. For life threatening emergencies, please call 911     [  1]  Allergies Allergen Reactions   Infed [Iron Dextran] Other (See Comments)    Decreased BP    Vicodin [Hydrocodone -Acetaminophen ] Other (See Comments)    Decreased BP   Hydrocodone  Other (See Comments)    Unable to recall reaction   Voltaren [Diclofenac] Other (See Comments)    Hypotension

## 2024-02-28 NOTE — Anesthesia Procedure Notes (Signed)
 Procedure Name: Intubation Date/Time: 02/28/2024 3:45 PM  Performed by: Kearney Rosina SAILOR, RNPre-anesthesia Checklist: Patient identified, Emergency Drugs available, Suction available and Patient being monitored Patient Re-evaluated:Patient Re-evaluated prior to induction Oxygen Delivery Method: Circle system utilized Preoxygenation: Pre-oxygenation with 100% oxygen Induction Type: IV induction Ventilation: Mask ventilation without difficulty Laryngoscope Size: Glidescope and 4 Grade View: Grade I Tube type: Oral Tube size: 7.5 mm Number of attempts: 1 Airway Equipment and Method: Stylet Placement Confirmation: ETT inserted through vocal cords under direct vision, positive ETCO2 and breath sounds checked- equal and bilateral Secured at: 24 cm Tube secured with: Tape Dental Injury: Teeth and Oropharynx as per pre-operative assessment  Comments: Atraumatic placement, with bite block in place

## 2024-02-28 NOTE — Transfer of Care (Signed)
 Immediate Anesthesia Transfer of Care Note  Patient: Carl Sirico Sr.  Procedure(s) Performed: RIGHT SHOULDER ARTHROSCOPIC WASHOUT AND PLACEMENT OF ANTIBIOTIC BEADS (Right: Shoulder)  Patient Location: PACU  Anesthesia Type:General and Regional  Level of Consciousness: awake, alert , and oriented  Airway & Oxygen Therapy: Patient Spontanous Breathing and Patient connected to face mask oxygen  Post-op Assessment: Report given to RN and Post -op Vital signs reviewed and stable  Post vital signs: Reviewed and stable  Last Vitals:  Vitals Value Taken Time  BP 121/55 02/28/24 17:23  Temp    Pulse 84 02/28/24 17:27  Resp 18 02/28/24 17:27  SpO2 99 % 02/28/24 17:27  Vitals shown include unfiled device data.  Last Pain:  Vitals:   02/28/24 1428  TempSrc: Oral  PainSc: 7          Complications: There were no known notable events for this encounter.

## 2024-02-28 NOTE — Progress Notes (Signed)
 PT Cancellation Note  Patient Details Name: Carl Schwager Sr. MRN: [id number] DOB: 1960/08/26   Cancelled Treatment:    Reason Eval/Treat Not Completed: Patient at procedure or test/unavailable; in HD earlier, now down in OR.  Will follow up another day.   Montie Portal 02/28/2024, 3:13 PM Micheline Portal, PT Acute Rehabilitation Services [phone] 02/28/2024

## 2024-02-29 DIAGNOSIS — M00861 Arthritis due to other bacteria, right knee: Secondary | ICD-10-CM | POA: Diagnosis not present

## 2024-02-29 DIAGNOSIS — T8459XD Infection and inflammatory reaction due to other internal joint prosthesis, subsequent encounter: Secondary | ICD-10-CM

## 2024-02-29 DIAGNOSIS — T8459XA Infection and inflammatory reaction due to other internal joint prosthesis, initial encounter: Secondary | ICD-10-CM

## 2024-02-29 DIAGNOSIS — R29898 Other symptoms and signs involving the musculoskeletal system: Secondary | ICD-10-CM | POA: Diagnosis not present

## 2024-02-29 DIAGNOSIS — N186 End stage renal disease: Secondary | ICD-10-CM | POA: Diagnosis not present

## 2024-02-29 DIAGNOSIS — H66012 Acute suppurative otitis media with spontaneous rupture of ear drum, left ear: Secondary | ICD-10-CM | POA: Diagnosis not present

## 2024-02-29 DIAGNOSIS — Z96619 Presence of unspecified artificial shoulder joint: Secondary | ICD-10-CM | POA: Diagnosis not present

## 2024-02-29 DIAGNOSIS — M25511 Pain in right shoulder: Secondary | ICD-10-CM | POA: Diagnosis not present

## 2024-02-29 LAB — RENAL FUNCTION PANEL
Albumin: 2.9 g/dL — ABNORMAL LOW (ref 3.5–5.0)
Anion gap: 14 (ref 5–15)
BUN: 35 mg/dL — ABNORMAL HIGH (ref 8–23)
CO2: 25 mmol/L (ref 22–32)
Calcium: 9.1 mg/dL (ref 8.9–10.3)
Chloride: 95 mmol/L — ABNORMAL LOW (ref 98–111)
Creatinine, Ser: 5.32 mg/dL — ABNORMAL HIGH (ref 0.61–1.24)
GFR, Estimated: 11 mL/min — ABNORMAL LOW
Glucose, Bld: 96 mg/dL (ref 70–99)
Phosphorus: 4.7 mg/dL — ABNORMAL HIGH (ref 2.5–4.6)
Potassium: 4.8 mmol/L (ref 3.5–5.1)
Sodium: 134 mmol/L — ABNORMAL LOW (ref 135–145)

## 2024-02-29 LAB — GLUCOSE, CAPILLARY
Glucose-Capillary: 97 mg/dL (ref 70–99)
Glucose-Capillary: 121 mg/dL — ABNORMAL HIGH (ref 70–99)
Glucose-Capillary: 93 mg/dL (ref 70–99)
Glucose-Capillary: 101 mg/dL — ABNORMAL HIGH (ref 70–99)

## 2024-02-29 MED ORDER — CLOPIDOGREL BISULFATE 75 MG PO TABS
75.0000 mg | ORAL_TABLET | Freq: Every day | ORAL | Status: DC
  Administered 2024-02-29 – 2024-03-03 (×3): 75 mg via ORAL
  Filled 2024-02-29 (×3): qty 1

## 2024-02-29 NOTE — Plan of Care (Signed)
" °  Problem: Education: Goal: Knowledge of General Education information will improve Description: Including pain rating scale, medication(s)/side effects and non-pharmacologic comfort measures Outcome: Progressing   Problem: Health Behavior/Discharge Planning: Goal: Ability to manage health-related needs will improve Outcome: Progressing   Problem: Clinical Measurements: Goal: Ability to maintain clinical measurements within normal limits will improve Outcome: Progressing Goal: Will remain free from infection Outcome: Progressing Goal: Diagnostic test results will improve Outcome: Progressing Goal: Respiratory complications will improve Outcome: Progressing Goal: Cardiovascular complication will be avoided Outcome: Progressing   Problem: Activity: Goal: Risk for activity intolerance will decrease Outcome: Progressing   Problem: Nutrition: Goal: Adequate nutrition will be maintained Outcome: Progressing   Problem: Coping: Goal: Level of anxiety will decrease Outcome: Progressing   Problem: Elimination: Goal: Will not experience complications related to bowel motility Outcome: Progressing Goal: Will not experience complications related to urinary retention Outcome: Progressing   Problem: Pain Managment: Goal: General experience of comfort will improve and/or be controlled Outcome: Progressing   Problem: Safety: Goal: Ability to remain free from injury will improve Outcome: Progressing   Problem: Skin Integrity: Goal: Risk for impaired skin integrity will decrease Outcome: Progressing   Problem: Education: Goal: Ability to describe self-care measures that may prevent or decrease complications (Diabetes Survival Skills Education) will improve Outcome: Progressing Goal: Individualized Educational Video(s) Outcome: Progressing   Problem: Coping: Goal: Ability to adjust to condition or change in health will improve Outcome: Progressing   Problem: Fluid  Volume: Goal: Ability to maintain a balanced intake and output will improve Outcome: Progressing   Problem: Health Behavior/Discharge Planning: Goal: Ability to identify and utilize available resources and services will improve Outcome: Progressing Goal: Ability to manage health-related needs will improve Outcome: Progressing   Problem: Metabolic: Goal: Ability to maintain appropriate glucose levels will improve Outcome: Progressing   Problem: Nutritional: Goal: Maintenance of adequate nutrition will improve Outcome: Progressing Goal: Progress toward achieving an optimal weight will improve Outcome: Progressing   Problem: Skin Integrity: Goal: Risk for impaired skin integrity will decrease Outcome: Progressing   Problem: Tissue Perfusion: Goal: Adequacy of tissue perfusion will improve Outcome: Progressing   Problem: Education: Goal: Knowledge of the prescribed therapeutic regimen will improve Outcome: Progressing   Problem: Bowel/Gastric: Goal: Gastrointestinal status for postoperative course will improve Outcome: Progressing   Problem: Cardiac: Goal: Ability to maintain an adequate cardiac output Outcome: Progressing Goal: Will show no evidence of cardiac arrhythmias Outcome: Progressing   Problem: Nutritional: Goal: Will attain and maintain optimal nutritional status Outcome: Progressing   Problem: Neurological: Goal: Will regain or maintain usual level of consciousness Outcome: Progressing   Problem: Clinical Measurements: Goal: Ability to maintain clinical measurements within normal limits Outcome: Progressing Goal: Postoperative complications will be avoided or minimized Outcome: Progressing   Problem: Respiratory: Goal: Will regain and/or maintain adequate ventilation Outcome: Progressing Goal: Respiratory status will improve Outcome: Progressing   Problem: Skin Integrity: Goal: Demonstrates signs of wound healing without infection Outcome:  Progressing   Problem: Urinary Elimination: Goal: Will remain free from infection Outcome: Progressing Goal: Ability to achieve and maintain adequate urine output Outcome: Progressing   Problem: Education: Goal: Knowledge of the prescribed therapeutic regimen will improve Outcome: Progressing Goal: Understanding of activity limitations/precautions following surgery will improve Outcome: Progressing Goal: Individualized Educational Video(s) Outcome: Progressing   Problem: Activity: Goal: Ability to tolerate increased activity will improve Outcome: Progressing   Problem: Pain Management: Goal: Pain level will decrease with appropriate interventions Outcome: Progressing   "

## 2024-02-29 NOTE — Progress Notes (Signed)
 [facility]  PROGRESS NOTE  Carl Mosco Sr. FMW:[id number] DOB: Sep 08, 1960 DOA: 02/08/2024 PCP: [name], MD   Brief HPI:   64 year old with past medical history significant for ESRD on hemodialysis MWF, PVD s/p angioplasty right S FA October 2025 for ischemic rest pain as well as C4 spinal fracture April 2025 with incomplete spinal cord injury requiring C4 corpectomy, fusion and interbody cage by Dr. [name], wheelchair-bound for a time but progressed by Agurs to walking with a cane and driving, presented with right arm and leg weakness, fall as well as posterior neck pain, fever, ear drainage.   Admitted started on antibiotics, neurosurgery, neurology orthopedic ENT and nephrology consulted.   Diagnosed with otitis media and ruptured eardrum.  Right-sided weakness believed to be recrudescence of old spinal cord injury symptoms in the setting of infection.   He underwent Arthrocentesis of right knee, synovial fluid consistent with septic arthritis, underwent irrigation and debridement by Dr. [name] on 1/9. ID consulted recommend 4 weeks of IV antibiotics with HD> Vancomycin  and cefepime . End date 03/23/2024.   Spike fever 1/12 overnight. ID will follow up on patient today. ID also recommend Ortho evaluation for fluid collection of Right Shoulder. Ortho will consult IR for aspiration.       Assessment/Plan:   Sepsis due to otitis media with suspected TM perforation, right knee septic arthritis - Met sepsis criteria on admission; sepsis physiology has resolved - Seen by ENT, underwent CT of temporal bone without evidence of coalescent mastoiditis, bone lesion or abscess - Completed 10 days of antibiotics for otitis media  -needs follow up with ENT on January 5 -- 12th. I sent message through secure chat to Dr [name].  - Treated with  Ciprodex  drops, ENT recommend it for 14 days. Completed.  -Seen by ENT Dr. Penne [name], recommend to follow-up with him after discharge. -He  anticipates aural toilet in the office with microscope and updated hearing test.     Right-sided weakness History of cervical decompression corpectomy C4 by [name] April 2025 Remote left occipital infarct - There was some concern for recurrent spinal cord injury, neurosurgery and neurology were consulted.   -A spinal MRI showed no new cord injury. - It was thought that his symptoms were recrudescence of old  symptoms due to  acute infection.    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  Had knee aspiration and injection on  1/06  -knee synovial fluid, no growth to date.  -WBC Synovial fluid notice to be 71,000.  -Treated with Augmentin  and Linezolid .  -ID consulted , recommend IV antibiotics with HD for 4 weeks.  Currently on vancomycin  and cefepime ,  Monday Wednesday and Friday with hemodialysis. Ortho consulted for evaluation of fluid collection right shoulder in setting of persistent fever.  -IR was consulted and small amount of fluid in soft tissue anterior and posterior to right shoulder aspirated. - Fluid culture showed no growth; WBC count 93,000 -Seen by Dr. [name], underwent arthroscopic I&D with placement of antibiotic beads and drain in right shoulder -Seen by Ortho today, plan to keep sling for the weekend, okay to discontinue sling on Monday and begin functional activities in the right arm. As per Ortho, Although not an ideal treatment for this particular problem the plan at this time would be to finish out a course of IV and p.o. antibiotics per infectious disease and reaspirate the shoulder in approximately  8 weeks    -Peripheral vascular disease s/p angioplasty October 2025; - Continue aspirin  Lipitor and Plavix    ESRD on hemodialysis: MWF - Nephrology following.   -Lokelma   daily  Continue with HD.    Chronic hypotension,  asymptomatic -Continue midodrine  Refuse meds at times.  ECHO normal EF. 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  Per cardiology small mobile echodensity on the ventricular aspect of the aortic valve is likely degenerative changes of the valve.  Also blood cultures has been negative. -spike fever, repeat blood cultures.  -ID informed of echo finding.  -No plan to do TEE as this would not change management as per ID.  He would not be candidate for CT surgery and plan to get 6 weeks of vancomycin  and cefepime .   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 He would not accept blood transfusion do to religious believes.  Hemoglobin is stable at 8.2        DVT prophylaxis: Heparin   Medications     (feeding supplement) PROSource Plus  30 mL Oral BID BM   aspirin  EC  81 mg Oral Daily   atorvastatin   10 mg Oral Daily   bupivacaine (PF)  10 mL Infiltration Once   Chlorhexidine  Gluconate Cloth  6 each Topical Q0600   Chlorhexidine  Gluconate Cloth  6 each Topical Q0600   cinacalcet   120 mg Oral Q breakfast   darbepoetin (ARANESP ) injection - DIALYSIS  100 mcg Subcutaneous Q Fri-1800   docusate sodium   100 mg Oral BID   feeding supplement (KATE FARMS STANDARD 1.4) Liquid  325 mL Oral Daily   ferrous sulfate   325 mg Oral Q breakfast   folic acid   1 mg Oral Daily   gabapentin   300 mg Oral QHS   insulin  aspart  0-6 Units Subcutaneous TID WC   lidocaine   1 patch Transdermal Q24H   lidocaine   1 patch Transdermal Q24H   midodrine   10 mg Oral TID WC   multivitamin  1 tablet Oral QHS   pantoprazole   40 mg Oral BID AC   psyllium  1 packet Oral Daily   sevelamer  carbonate  2,400 mg Oral TID WC   sodium chloride  flush  3 mL Intravenous Q12H     Data Reviewed:    CBG:  Recent Labs  Lab 02/27/24 2057 02/28/24 1247 02/28/24 1457 02/28/24 2138 02/29/24 0826  GLUCAP 127* 83 85 105* 97    SpO2: 99 % O2 Flow Rate (L/min): 2 L/min    Vitals:   02/28/24 1834 02/28/24 2038 02/29/24 0105 02/29/24 0827  BP: 130/74 115/71 103/65 109/62  Pulse: 84 84 85 83  Resp:  17  20  Temp: 97.7 F (36.5 C) 98.3 F (36.8 C) 99.3 F (37.4 C) 98.7 F (37.1 C)  TempSrc: Oral Oral  Oral  SpO2: 98% 97% 97% 99%  Weight:      Height:          Data Reviewed:  Basic Metabolic Panel: Recent Labs  Lab 02/25/24 0531 02/26/24 0304 02/27/24 0301 02/28/24 0316 02/28/24 1501 02/29/24 0655  NA 136 133* 133* 132*  133* 134* 134*  K 4.6 5.0 4.2 4.8  4.6 4.4 4.8  CL 91* 89* 91* 89*  90* 94* 95*  CO2 28 27 28  26  28  --  25  GLUCOSE 82 116* 87 88  96 86 96  BUN 45* 60* 33* 54*  53* 25* 35*  CREATININE 6.63* 8.43* 5.80* 7.54*  7.70* 4.30* 5.32*  CALCIUM  8.7* 8.2* 8.6* 8.7*  8.7*  --  9.1  PHOS 4.7* 5.5* 4.3 5.2*  --  4.7*    CBC: Recent Labs  Lab 02/23/24 1053 02/24/24 0757 02/27/24 0301 02/28/24 0316 02/28/24 0731 02/28/24 1501  WBC 9.9 9.5 6.2 7.0 8.0  --   NEUTROABS  --   --   --   --  5.6  --   HGB 7.9* 8.1* 7.8* 8.3* 7.3* 8.2*  HCT 24.7* 25.3* 24.3* 26.2* 22.3* 24.0*  MCV 90.8 91.0 91.0 90.0 88.5  --   PLT 474* 478* 335 324 350  --     LFT Recent Labs  Lab 02/25/24 0531 02/26/24 0304 02/27/24 0301 02/28/24 0316 02/29/24 0655  AST  --   --  18  --   --   ALT  --   --  <5  --   --   ALKPHOS  --   --  98  --   --   BILITOT  --   --  0.4  --   --   PROT  --   --  7.8  --   --   ALBUMIN  3.1* 2.9* 3.0* 3.1* 2.9*     Antibiotics: Anti-infectives (From admission, onward)    Start     Dose/Rate Route Frequency Ordered Stop   02/28/24 1643  gentamicin  (GARAMYCIN ) injection  Status:  Discontinued          As needed 02/28/24 1643 02/28/24 1719   02/28/24 1627  vancomycin  (VANCOCIN ) powder  Status:  Discontinued          As  needed 02/28/24 1627 02/28/24 1719   02/27/24 0745  ceFAZolin  (ANCEF ) IVPB 2g/100 mL premix  Status:  Discontinued        2 g 200 mL/hr over 30 Minutes Intravenous On call to O.R. 02/27/24 0647 02/28/24 0559   02/26/24 1200  vancomycin  (VANCOCIN ) IVPB 1000 mg/200 mL premix        1,000 mg 200 mL/hr over 60 Minutes Intravenous Every M-W-F (Hemodialysis) 02/24/24 0951 03/23/24 2359   02/24/24 1500  vancomycin  (VANCOREADY) IVPB 2000 mg/400 mL        2,000 mg 200 mL/hr over 120 Minutes Intravenous  Once 02/24/24 0945 02/24/24 1854   02/24/24 1200  ceFEPIme  (MAXIPIME ) 2 g in sodium chloride  0.9 % 100 mL IVPB        2 g 200 mL/hr over 30 Minutes Intravenous Every M-W-F (Hemodialysis) 02/24/24 0945 03/23/24 2359   02/24/24 0000  ceFEPime  (MAXIPIME ) IVPB        2 g Intravenous Every M-W-F (Hemodialysis) 02/24/24 1121 03/23/24 2359   02/24/24 0000  vancomycin  IVPB        1,000 mg Intravenous Every M-W-F (Hemodialysis) 02/24/24 1121 03/23/24 2359   02/22/24 1000  amoxicillin -clavulanate (AUGMENTIN ) 500-125 MG per tablet 1 tablet  Status:  Discontinued        1 tablet Oral 2 times daily 02/22/24 0935 02/24/24 0945   02/22/24 0600  ceFAZolin  (ANCEF ) IVPB 2g/100 mL premix  Status:  Discontinued        2 g 200 mL/hr over 30 Minutes Intravenous On call to O.R. 02/21/24 1200 02/21/24 1605   02/21/24 2200  amoxicillin -clavulanate (AUGMENTIN ) 500-125 MG per tablet 1 tablet  Status:  Discontinued        1 tablet Oral Daily at bedtime 02/20/24 1042 02/22/24 0935   02/20/24 1130  linezolid  (ZYVOX ) tablet 600 mg  Status:  Discontinued        600 mg Oral Every 12 hours 02/20/24 1037 02/24/24 0945   02/20/24 1130  amoxicillin -clavulanate (AUGMENTIN ) 875-125 MG per tablet 1 tablet  Status:  Discontinued        1 tablet Oral Every 12 hours 02/20/24 1038 02/20/24 1041   02/20/24 1130  amoxicillin -clavulanate (AUGMENTIN ) 500-125 MG per tablet 1 tablet        1 tablet Oral  Once 02/20/24 1042 02/20/24 1329    02/19/24 1545  amoxicillin -clavulanate (AUGMENTIN ) 500-125 MG per tablet 1 tablet  Status:  Discontinued        1 tablet Oral  Once 02/19/24 1527 02/20/24 1038   02/17/24 0615  amoxicillin -clavulanate (AUGMENTIN ) 500-125 MG per tablet 1 tablet        1 tablet Oral Every 12 hours 02/17/24 0556 02/18/24 2151   02/09/24 0600  piperacillin -tazobactam (ZOSYN ) IVPB 2.25 g  Status:  Discontinued        2.25 g 100 mL/hr over 30 Minutes Intravenous Every 8 hours 02/09/24 0135 02/17/24 0556   02/08/24 2200  amoxicillin -clavulanate (AUGMENTIN ) 875-125 MG per tablet 1 tablet  Status:  Discontinued        1 tablet Oral Every 12 hours 02/08/24 2104 02/09/24 0135   02/08/24 2200  piperacillin -tazobactam (ZOSYN ) IVPB 3.375 g        3.375 g 100 mL/hr over 30 Minutes Intravenous  Once 02/08/24 2153 02/08/24 2314   02/08/24 0000  amoxicillin -clavulanate (AUGMENTIN ) 875-125 MG tablet  Status:  Discontinued        1 tablet Oral Every 12 hours 02/08/24 1908 02/24/24         CONSULTS nephrology, ID  Code Status: Full code  Family Communication: No family at bedside     Subjective   Patient seen and examined, status post I&D with placement of antibiotic beads and drain in right shoulder.  Pain well-controlled.   Objective    Physical Examination:  Appears in no acute distress S1-S2, regular, no murmur auscultated Abdomen is soft, nontender, no organomegaly Extremities-right shoulder in sling Lungs are clear to auscultation bilaterally          Sarea Fyfe S Leonce Bale   [facility] If 7PM-7AM, please contact night-coverage at [email or web address], Office  [phone]   02/29/2024, 8:32 AM  LOS: 18 days

## 2024-02-29 NOTE — Progress Notes (Signed)
 Patient stable. Pain controlled. Block right upper extremity has worn off.  Has good hand function. Hemovac drain removed Cultures negative to date as would be expected Plan to keep in sling for the weekend but okay to discontinue sling on Monday and begin functional activities with the right arm Although not an ideal treatment for this particular problem the plan at this time would be to finish out a course of IV and p.o. antibiotics per infectious disease and reaspirate the shoulder in approximately 8 weeks

## 2024-02-29 NOTE — Progress Notes (Signed)
 " [facility] Progress Note    Assessment/ Plan:   ESRD: on HD MWF. Pt requests 1st shift. Wants to hold off on recliner HD for now. Next HD Monday -vanc+cefepime  with HD  Volume/chronic hypotension: HD with midodrine  support, UF as tolerated  Anemia of ESRD: declines IV alb/ prbc's, hold off on IV Fe, increased ESA dose (hgb 8.2)  Hyperkalemia: persistent, mild, had been on lokelma --on hold for now, can resume if needed, k remains wnl  Septic arthritis R knee and right shoulder: SP I&D 1/09 per ortho, refused IV so getting po abx--per primary. S/p fluid collection of right shoulder 1/13, highly infectious. ID and ortho following, s/p right shoulder I&D with abx spacer placement  Otitis media w/ TM perforation: s/p course of abx, ENT has seen  R sided weakness: main c/o on admission, CVA w/u was negative  Secondary hyperparathyroidism: phos at goal  OP Dialysis Orders:  MWF NW 4h  B400  87.3kg  2K bath  AVF   Heparin  3000 ESA: Mircera 50 q 2 wks (last 12/15) BMM: Calcitriol  2 mcg PO q HD, cinacalcet  120 mg daily   Subjective:   Patient seen and examined in room. No complaints. Doing well post op Still wants to do HD in bed for Monday. He is working on weight bearing on right knee   Objective:   BP 109/62 (BP Location: Left Arm)   Pulse 83   Temp 98.7 F (37.1 C) (Oral)   Resp 20   Ht 6' 1 (1.854 m)   Wt 87 kg   SpO2 99%   BMI 25.30 kg/m   Intake/Output Summary (Last 24 hours) at 02/29/2024 0857 Last data filed at 02/29/2024 9166 Gross per 24 hour  Intake 1136 ml  Output 215 ml  Net 921 ml    Weight change: -0.2 kg  Physical Exam: Gen: NAD CVS: RRR Resp: unlabored, normal wob Abd: soft Ext: right knee dressing in place, no edema LE's, RUE in sling Neuro: awake, alert Dialysis access: RUE AVF  Imaging: No results found.   Labs: BMET Recent Labs  Lab 02/23/24 0557 02/24/24 0506 02/25/24 0531 02/26/24 0304 02/27/24 0301  02/28/24 0316 02/28/24 1501 02/29/24 0655  NA 135 134* 136 133* 133* 132*  133* 134* 134*  K 4.6 4.5 4.6 5.0 4.2 4.8  4.6 4.4 4.8  CL 91* 89* 91* 89* 91* 89*  90* 94* 95*  CO2 26 24 28 27 28 26  28   --  25  GLUCOSE 83 84 82 116* 87 88  96 86 96  BUN 61* 72* 45* 60* 33* 54*  53* 25* 35*  CREATININE 7.43* 9.23* 6.63* 8.43* 5.80* 7.54*  7.70* 4.30* 5.32*  CALCIUM  8.6* 8.3* 8.7* 8.2* 8.6* 8.7*  8.7*  --  9.1  PHOS 6.5* 6.7* 4.7* 5.5* 4.3 5.2*  --  4.7*   CBC Recent Labs  Lab 02/24/24 0757 02/27/24 0301 02/28/24 0316 02/28/24 0731 02/28/24 1501  WBC 9.5 6.2 7.0 8.0  --   NEUTROABS  --   --   --  5.6  --   HGB 8.1* 7.8* 8.3* 7.3* 8.2*  HCT 25.3* 24.3* 26.2* 22.3* 24.0*  MCV 91.0 91.0 90.0 88.5  --   PLT 478* 335 324 350  --     Medications:     (feeding supplement) PROSource Plus  30 mL Oral BID BM   aspirin  EC  81 mg Oral Daily   atorvastatin   10 mg Oral Daily  bupivacaine (PF)  10 mL Infiltration Once   Chlorhexidine  Gluconate Cloth  6 each Topical Q0600   Chlorhexidine  Gluconate Cloth  6 each Topical Q0600   cinacalcet   120 mg Oral Q breakfast   darbepoetin (ARANESP ) injection - DIALYSIS  100 mcg Subcutaneous Q Fri-1800   docusate sodium   100 mg Oral BID   feeding supplement (KATE FARMS STANDARD 1.4) Liquid  325 mL Oral Daily   ferrous sulfate   325 mg Oral Q breakfast   folic acid   1 mg Oral Daily   gabapentin   300 mg Oral QHS   insulin  aspart  0-6 Units Subcutaneous TID WC   lidocaine   1 patch Transdermal Q24H   lidocaine   1 patch Transdermal Q24H   midodrine   10 mg Oral TID WC   multivitamin  1 tablet Oral QHS   pantoprazole   40 mg Oral BID AC   psyllium  1 packet Oral Daily   sevelamer  carbonate  2,400 mg Oral TID WC   sodium chloride  flush  3 mL Intravenous Q12H      Ephriam Stank, MD [facility] 02/29/2024, 8:57 AM   "

## 2024-02-29 NOTE — Anesthesia Postprocedure Evaluation (Signed)
"   Anesthesia Post Note  Patient: Carl Moline Sr.  Procedure(s) Performed: RIGHT SHOULDER ARTHROSCOPIC WASHOUT AND PLACEMENT OF ANTIBIOTIC BEADS (Right: Shoulder)     Patient location during evaluation: PACU Anesthesia Type: General Level of consciousness: sedated and patient cooperative Pain management: pain level controlled Vital Signs Assessment: post-procedure vital signs reviewed and stable Respiratory status: spontaneous breathing Cardiovascular status: stable Anesthetic complications: no   There were no known notable events for this encounter.  Last Vitals:  Vitals:   02/28/24 2038 02/29/24 0105  BP: 115/71 103/65  Pulse: 84 85  Resp: 17   Temp: 36.8 C 37.4 C  SpO2: 97% 97%    Last Pain:  Vitals:   02/29/24 0045  TempSrc:   PainSc: Asleep                 Norleen Pope      "

## 2024-02-29 NOTE — Progress Notes (Signed)
 Pharmacy Antibiotic Note  Carl Waddell Sr. is a 64 y.o. male admitted on 02/08/2024 with right septic knee s/p washout 1/9. Cultures showing no growth.   Pharmacy has been consulted for vancomycin  dosing. Patient is ESRD on HD MWF.   Plan: Cefepime  2 gm with HD MWF Vancomycin  1000 mg every MWF with HD  Monitor dialysis sessions/tolerance Ordered pre-HD vancomycin  random level for Monday (1/19 AM) ID planning 4 weeks of therapy (Last doses 2/9)   Height: 6' 1 (185.4 cm) Weight: 87 kg (191 lb 12.8 oz) IBW/kg (Calculated) : 79.9  Temp (24hrs), Avg:98 F (36.7 C), Min:97.2 F (36.2 C), Max:99.3 F (37.4 C)  Recent Labs  Lab 02/23/24 1053 02/24/24 0506 02/24/24 0757 02/25/24 0531 02/26/24 0304 02/27/24 0301 02/28/24 0316 02/28/24 0731 02/28/24 1501 02/29/24 0655  WBC 9.9  --  9.5  --   --  6.2 7.0 8.0  --   --   CREATININE  --    < >  --    < > 8.43* 5.80* 7.54*  7.70*  --  4.30* 5.32*   < > = values in this interval not displayed.    Estimated Creatinine Clearance: 15.6 mL/min (A) (by C-G formula based on SCr of 5.32 mg/dL (H)).    Allergies[1]  Thank you for allowing pharmacy to be a part of this patients care.  WENDI Amon Rocher, PharmD PGY-1 Pharmacy Resident [facility] 02/29/2024 9:09 AM       [1]  Allergies Allergen Reactions   Infed Bertie Dextran] Other (See Comments)    Decreased BP    Vicodin [Hydrocodone -Acetaminophen ] Other (See Comments)    Decreased BP   Hydrocodone  Other (See Comments)    Unable to recall reaction   Voltaren [Diclofenac] Other (See Comments)    Hypotension

## 2024-02-29 NOTE — Progress Notes (Signed)
 "   [facility]  Date of Admission:  02/08/2024     Reason for Follow Up: Septic arthritis of knee, right (HCC)  Total days of antibiotics 21         ASSESSMENT:  [name] is a 64 year old male with end-stage renal disease on hemodialysis presenting to the hospital with right knee pain and found to have septic right knee arthritis status post arthroscopy and subsequently found to have right shoulder prosthetic joint infection with synovial fluid showing 64,000/94.000 WBC.   [name] is POD #1 from arthroscopic incision and drainage of his right prosthetic shoulder joint.  Shoulder joint specimens are without growth on culture and no organisms on Gram stain.  Would suspect yield would be low given his current course of antimicrobials.  Will continue to monitor cultures for any new organisms and narrow antibiotics appropriately.  Discussed plan of care for 6 weeks of IV vancomycin  and cefepime  with hemodialysis and will need at least 3 months of oral medication for his right shoulder prosthetic joint infection.  Continue postoperative wound care per orthopedics.  Will arrange with nephrology for outpatient antibiotics with dialysis.  Continue current dose of vancomycin  and cefepime .  Encouraged continued blood sugar control as poorly controlled diabetes can lead to complicated healing and increased risk of infection. Continue standard/universal precautions.  Okay for discharge from ID standpoint and will arrange follow-up in ID clinic.  Postoperative wound care per orthopedics.  Remaining medical and supportive care per internal medicine.  PLAN:  Continue current dose of vancomycin  and cefepime  with hemodialysis through 04/10/24 Post-operative wound care per Orthopedics. Monitor cultures for any organisms and adjust antibiotics as appropriate. Ok for discharge from ID standpoint and will arrange follow up in ID clinic.  Continue standard/universal precautions.   Remaining medical and supportive care per Internal Medicine and Nephrology.    Principal Problem:   Septic arthritis of knee, right (HCC) Active Problems:   GERD (gastroesophageal reflux disease)   HLD (hyperlipidemia)   Essential hypertension   Diarrhea, unspecified   ESRD (end stage renal disease) (HCC)   Obstructive sleep apnea   Type 2 diabetes mellitus with diabetic peripheral angiopathy without gangrene (HCC)   Sepsis (HCC)   Acute suppurative otitis media of left ear with spontaneous rupture of tympanic membrane   Mastoid disorder, bilateral   Conductive hearing loss   Acute right-sided weakness   Arthritis, septic, knee (HCC)   Acute pain of right shoulder   Abscess   Acute bacterial endocarditis    (feeding supplement) PROSource Plus  30 mL Oral BID BM   aspirin  EC  81 mg Oral Daily   atorvastatin   10 mg Oral Daily   bupivacaine (PF)  10 mL Infiltration Once   Chlorhexidine  Gluconate Cloth  6 each Topical Q0600   Chlorhexidine  Gluconate Cloth  6 each Topical Q0600   cinacalcet   120 mg Oral Q breakfast   clopidogrel   75 mg Oral Daily   darbepoetin (ARANESP ) injection - DIALYSIS  100 mcg Subcutaneous Q Fri-1800   docusate sodium   100 mg Oral BID   feeding supplement (KATE FARMS STANDARD 1.4) Liquid  325 mL Oral Daily   ferrous sulfate   325 mg Oral Q breakfast   folic acid   1 mg Oral Daily   gabapentin   300 mg Oral QHS   insulin  aspart  0-6 Units Subcutaneous TID WC   lidocaine   1 patch Transdermal Q24H   lidocaine   1 patch Transdermal Q24H   midodrine   10 mg Oral TID WC   multivitamin  1 tablet Oral QHS   pantoprazole   40 mg Oral BID AC   psyllium  1 packet Oral Daily   sevelamer  carbonate  2,400 mg Oral TID WC   sodium chloride  flush  3 mL Intravenous Q12H    SUBJECTIVE:  Afebrile overnight with no acute events. Some post-surgical right shoulder pain otherwise doing well. Tolerating antibiotics with no adverse side effects.  Allergies[1]   Review of  Systems: Review of Systems  Constitutional:  Negative for chills, fever and weight loss.  Respiratory:  Negative for cough, shortness of breath and wheezing.   Cardiovascular:  Negative for chest pain and leg swelling.  Gastrointestinal:  Negative for abdominal pain, constipation, diarrhea, nausea and vomiting.  Musculoskeletal:        Right shoulder discomfort.   Skin:  Negative for rash.      OBJECTIVE: Vitals:   02/28/24 2038 02/29/24 0105 02/29/24 0827 02/29/24 1159  BP: 115/71 103/65 109/62 101/62  Pulse: 84 85 83 81  Resp: 17  20 20   Temp: 98.3 F (36.8 C) 99.3 F (37.4 C) 98.7 F (37.1 C) 97.8 F (36.6 C)  TempSrc: Oral  Oral   SpO2: 97% 97% 99% 100%  Weight:      Height:       Body mass index is 25.3 kg/m.  Physical Exam Constitutional:      General: He is not in acute distress.    Appearance: He is well-developed.  Cardiovascular:     Rate and Rhythm: Normal rate and regular rhythm.     Heart sounds: Normal heart sounds.  Pulmonary:     Effort: Pulmonary effort is normal.     Breath sounds: Normal breath sounds.  Skin:    General: Skin is warm and dry.  Neurological:     Mental Status: He is alert.     Lab Results Lab Results  Component Value Date   WBC 8.0 02/28/2024   HGB 8.2 (L) 02/28/2024   HCT 24.0 (L) 02/28/2024   MCV 88.5 02/28/2024   PLT 350 02/28/2024    Lab Results  Component Value Date   CREATININE 5.32 (H) 02/29/2024   BUN 35 (H) 02/29/2024   NA 134 (L) 02/29/2024   K 4.8 02/29/2024   CL 95 (L) 02/29/2024   CO2 25 02/29/2024    Lab Results  Component Value Date   ALT <5 02/27/2024   AST 18 02/27/2024   ALKPHOS 98 02/27/2024   BILITOT 0.4 02/27/2024     Microbiology: Recent Results (from the past 240 hours)  Culture, blood (Routine X 2) w Reflex to ID Panel     Status: None (Preliminary result)   Collection Time: 02/25/24  8:00 AM   Specimen: BLOOD LEFT HAND  Result Value Ref Range Status   Specimen Description BLOOD  LEFT HAND  Final   Special Requests   Final    BOTTLES DRAWN AEROBIC AND ANAEROBIC Blood Culture adequate volume   Culture   Final    NO GROWTH 4 DAYS Performed at [facility], [address], [place], [name]    Report Status PENDING  Incomplete  Culture, blood (Routine X 2) w Reflex to ID Panel     Status: None (Preliminary result)   Collection Time: 02/25/24  8:03 AM   Specimen: BLOOD LEFT HAND  Result Value Ref Range Status   Specimen Description BLOOD LEFT HAND  Final   Special Requests  Final    BOTTLES DRAWN AEROBIC AND ANAEROBIC Blood Culture adequate volume   Culture   Final    NO GROWTH 4 DAYS Performed at [facility], [address] [place], [name]    Report Status PENDING  Incomplete  Body fluid culture w Gram Stain     Status: None   Collection Time: 02/25/24  4:23 PM   Specimen: SHOULDER; Body Fluid  Result Value Ref Range Status   Specimen Description FLUID RIGHT SHOULDER  Final   Special Requests NO 2 ANTERIOR  Final   Gram Stain   Final    ABUNDANT WBC PRESENT, PREDOMINANTLY PMN NO ORGANISMS SEEN    Culture   Final    NO GROWTH 3 DAYS Performed at [facility], [address], [place], [name]    Report Status 02/28/2024 FINAL  Final  Body fluid culture w Gram Stain     Status: None   Collection Time: 02/25/24  4:27 PM   Specimen: Body Fluid  Result Value Ref Range Status   Specimen Description FLUID RIGHT SHOULDER  Final   Special Requests NO 1 POSTERIOR  Final   Gram Stain   Final    ABUNDANT WBC PRESENT, PREDOMINANTLY PMN NO ORGANISMS SEEN    Culture   Final    NO GROWTH 3 DAYS Performed at [facility], [address], [place], [name]    Report Status 02/28/2024 FINAL  Final  Surgical pcr screen     Status: None   Collection Time: 02/28/24 12:50 AM   Specimen: Nasal Mucosa; Nasal Swab  Result Value Ref Range Status   MRSA, PCR NEGATIVE NEGATIVE Final   Staphylococcus  aureus NEGATIVE NEGATIVE Final    Comment: (NOTE) The Xpert SA Assay (FDA approved for NASAL specimens in patients 68 years of age and older), is one component of a comprehensive surveillance program. It is not intended to diagnose infection nor to guide or monitor treatment. Performed at [facility], [address], [place], [name]   Aerobic/Anaerobic Culture w Gram Stain (surgical/deep wound)     Status: None (Preliminary result)   Collection Time: 02/28/24  4:30 PM   Specimen: Joint, Other; Body Fluid  Result Value Ref Range Status   Specimen Description SHOULDER RIGHT  Final   Special Requests SWAB A  INTRA ARTICULAR SUTURE  Final   Gram Stain NO WBC SEEN NO ORGANISMS SEEN   Final   Culture   Final    NO GROWTH < 24 HOURS Performed at [facility], [address], [place], [name]    Report Status PENDING  Incomplete  Aerobic/Anaerobic Culture w Gram Stain (surgical/deep wound)     Status: None (Preliminary result)   Collection Time: 02/28/24  4:35 PM   Specimen: Joint, Other; Body Fluid  Result Value Ref Range Status   Specimen Description TISSUE RIGHT SHOULDER  Final   Special Requests ESWAB B INTRA ARTICULAR SUTURE  Final   Gram Stain NO WBC SEEN NO ORGANISMS SEEN   Final   Culture   Final    NO GROWTH < 24 HOURS Performed at [facility], [address], [place], [name]    Report Status PENDING  Incomplete  Aerobic/Anaerobic Culture w Gram Stain (surgical/deep wound)     Status: None (Preliminary result)   Collection Time: 02/28/24  4:36 PM   Specimen: Joint, Other; Body Fluid  Result Value Ref Range Status   Specimen Description TISSUE RIGHT SHOULDER  Final   Special Requests SWAB C INTRA ARTICULAR SUTURE  Final   Gram Stain NO WBC SEEN NO ORGANISMS SEEN   Final   Culture   Final    NO GROWTH < 24 HOURS Performed at [facility], [address], [place], [name]    Report Status PENDING   Incomplete     Cathlyn July, NP [facility]  02/29/2024  1:59 PM     [1]  Allergies Allergen Reactions   Infed [Iron Dextran] Other (See Comments)    Decreased BP    Vicodin [Hydrocodone -Acetaminophen ] Other (See Comments)    Decreased BP   Hydrocodone  Other (See Comments)    Unable to recall reaction   Voltaren [Diclofenac] Other (See Comments)    Hypotension    "

## 2024-02-29 NOTE — Progress Notes (Signed)
 PT Cancellation Note  Patient Details Name: Carl Rampey Sr. MRN: [id number] DOB: [birth year]   Cancelled Treatment:    Reason Eval/Treat Not Completed: Pain limiting ability to participate now s/p R shoulder I&D 02/28/24. Patient requests follow up on PT treatment on Monday when he was told he can take RUE out of sling. Will follow up  Pinkey Mcjunkin, PT, DPT Acute Rehabilitation Services  Personal: Secure Chat Rehab Office: [phone]  Darice LITTIE Almas 02/29/2024, 9:28 AM

## 2024-03-01 DIAGNOSIS — N186 End stage renal disease: Secondary | ICD-10-CM | POA: Diagnosis not present

## 2024-03-01 DIAGNOSIS — T8459XA Infection and inflammatory reaction due to other internal joint prosthesis, initial encounter: Secondary | ICD-10-CM

## 2024-03-01 DIAGNOSIS — R29898 Other symptoms and signs involving the musculoskeletal system: Secondary | ICD-10-CM | POA: Diagnosis not present

## 2024-03-01 DIAGNOSIS — Z96619 Presence of unspecified artificial shoulder joint: Secondary | ICD-10-CM | POA: Diagnosis not present

## 2024-03-01 DIAGNOSIS — M25511 Pain in right shoulder: Secondary | ICD-10-CM | POA: Diagnosis not present

## 2024-03-01 DIAGNOSIS — H66012 Acute suppurative otitis media with spontaneous rupture of ear drum, left ear: Secondary | ICD-10-CM | POA: Diagnosis not present

## 2024-03-01 LAB — RENAL FUNCTION PANEL
Albumin: 2.8 g/dL — ABNORMAL LOW (ref 3.5–5.0)
Anion gap: 15 (ref 5–15)
BUN: 53 mg/dL — ABNORMAL HIGH (ref 8–23)
CO2: 26 mmol/L (ref 22–32)
Calcium: 9.1 mg/dL (ref 8.9–10.3)
Chloride: 94 mmol/L — ABNORMAL LOW (ref 98–111)
Creatinine, Ser: 6.8 mg/dL — ABNORMAL HIGH (ref 0.61–1.24)
GFR, Estimated: 8 mL/min — ABNORMAL LOW
Glucose, Bld: 92 mg/dL (ref 70–99)
Phosphorus: 4.3 mg/dL (ref 2.5–4.6)
Potassium: 4.9 mmol/L (ref 3.5–5.1)
Sodium: 134 mmol/L — ABNORMAL LOW (ref 135–145)

## 2024-03-01 LAB — CULTURE, BLOOD (ROUTINE X 2)
Culture: NO GROWTH
Culture: NO GROWTH
Special Requests: ADEQUATE
Special Requests: ADEQUATE

## 2024-03-01 LAB — GLUCOSE, CAPILLARY
Glucose-Capillary: 87 mg/dL (ref 70–99)
Glucose-Capillary: 91 mg/dL (ref 70–99)
Glucose-Capillary: 85 mg/dL (ref 70–99)
Glucose-Capillary: 96 mg/dL (ref 70–99)

## 2024-03-01 NOTE — Plan of Care (Signed)
   Problem: Education: Goal: Knowledge of General Education information will improve Description Including pain rating scale, medication(s)/side effects and non-pharmacologic comfort measures Outcome: Progressing   Problem: Health Behavior/Discharge Planning: Goal: Ability to manage health-related needs will improve Outcome: Progressing

## 2024-03-01 NOTE — Progress Notes (Signed)
 " [facility] Progress Note    Assessment/ Plan:   ESRD: on HD MWF. Pt requests 1st shift. Wants to hold off on recliner HD for now. Next HD Monday -vanc+cefepime  with HD  Volume/chronic hypotension: HD with midodrine  support, UF as tolerated  Anemia of ESRD: declines IV alb/ prbc's, hold off on IV Fe, increased ESA dose (hgb 8.2)  Hyperkalemia: persistent, mild, had been on lokelma --on hold for now, can resume if needed, k remains wnl  Septic arthritis R knee and right shoulder: SP I&D 1/09 per ortho, refused IV so getting po abx--per primary. S/p fluid collection of right shoulder 1/13, highly infectious. ID and ortho following, s/p right shoulder I&D with abx spacer placement  Otitis media w/ TM perforation: s/p course of abx, ENT has seen  R sided weakness: main c/o on admission, CVA w/u was negative  Secondary hyperparathyroidism: phos at goal  OP Dialysis Orders:  MWF NW 4h  B400  87.3kg  2K bath  AVF   Heparin  3000 ESA: Mircera 50 q 2 wks (last 12/15) BMM: Calcitriol  2 mcg PO q HD, cinacalcet  120 mg daily   Subjective:   Patient seen and examined in room. No complaints. Still wants to do HD in bed for Monday.    Objective:   BP 107/72 (BP Location: Left Arm)   Pulse 69   Temp 98.1 F (36.7 C)   Resp 20   Ht 6' 1 (1.854 m)   Wt 92.4 kg   SpO2 100%   BMI 26.88 kg/m   Intake/Output Summary (Last 24 hours) at 03/01/2024 9187 Last data filed at 02/29/2024 9166 Gross per 24 hour  Intake 236 ml  Output --  Net 236 ml    Weight change: 5.2 kg  Physical Exam: Gen: NAD CVS: RRR Resp: unlabored, normal wob Abd: soft Ext: right knee dressing in place, no edema LE's, RUE in sling Neuro: awake, alert Dialysis access: RUE AVF  Imaging: No results found.   Labs: BMET Recent Labs  Lab 02/24/24 0506 02/25/24 0531 02/26/24 0304 02/27/24 0301 02/28/24 0316 02/28/24 1501 02/29/24 0655 03/01/24 0411  NA 134* 136 133* 133* 132*  133* 134*  134* 134*  K 4.5 4.6 5.0 4.2 4.8  4.6 4.4 4.8 4.9  CL 89* 91* 89* 91* 89*  90* 94* 95* 94*  CO2 24 28 27 28 26  28   --  25 26  GLUCOSE 84 82 116* 87 88  96 86 96 92  BUN 72* 45* 60* 33* 54*  53* 25* 35* 53*  CREATININE 9.23* 6.63* 8.43* 5.80* 7.54*  7.70* 4.30* 5.32* 6.80*  CALCIUM  8.3* 8.7* 8.2* 8.6* 8.7*  8.7*  --  9.1 9.1  PHOS 6.7* 4.7* 5.5* 4.3 5.2*  --  4.7* 4.3   CBC Recent Labs  Lab 02/24/24 0757 02/27/24 0301 02/28/24 0316 02/28/24 0731 02/28/24 1501  WBC 9.5 6.2 7.0 8.0  --   NEUTROABS  --   --   --  5.6  --   HGB 8.1* 7.8* 8.3* 7.3* 8.2*  HCT 25.3* 24.3* 26.2* 22.3* 24.0*  MCV 91.0 91.0 90.0 88.5  --   PLT 478* 335 324 350  --     Medications:     (feeding supplement) PROSource Plus  30 mL Oral BID BM   aspirin  EC  81 mg Oral Daily   atorvastatin   10 mg Oral Daily   bupivacaine (PF)  10 mL Infiltration Once   Chlorhexidine  Gluconate Cloth  6 each  Topical Q0600   Chlorhexidine  Gluconate Cloth  6 each Topical Q0600   cinacalcet   120 mg Oral Q breakfast   clopidogrel   75 mg Oral Daily   darbepoetin (ARANESP ) injection - DIALYSIS  100 mcg Subcutaneous Q Fri-1800   docusate sodium   100 mg Oral BID   feeding supplement (KATE FARMS STANDARD 1.4) Liquid  325 mL Oral Daily   ferrous sulfate   325 mg Oral Q breakfast   folic acid   1 mg Oral Daily   gabapentin   300 mg Oral QHS   insulin  aspart  0-6 Units Subcutaneous TID WC   lidocaine   1 patch Transdermal Q24H   lidocaine   1 patch Transdermal Q24H   midodrine   10 mg Oral TID WC   multivitamin  1 tablet Oral QHS   pantoprazole   40 mg Oral BID AC   psyllium  1 packet Oral Daily   sevelamer  carbonate  2,400 mg Oral TID WC   sodium chloride  flush  3 mL Intravenous Q12H      Ephriam Stank, MD [facility] 03/01/2024, 8:12 AM   "

## 2024-03-01 NOTE — Progress Notes (Signed)
 [facility]  PROGRESS NOTE  Carl Louque Sr. FMW:[id number] DOB: 01-29-1961 DOA: 02/08/2024 PCP: [name], MD   Brief HPI:   64 year old with past medical history significant for ESRD on hemodialysis MWF, PVD [name]/p angioplasty right [name] FA October 2025 for ischemic rest pain as well as C4 spinal fracture April 2025 with incomplete spinal cord injury requiring C4 corpectomy, fusion and interbody cage by Dr. [name], wheelchair-bound for a time but progressed by Agurs to walking with a cane and driving, presented with right arm and leg weakness, fall as well as posterior neck pain, fever, ear drainage.   Admitted started on antibiotics, neurosurgery, neurology orthopedic ENT and nephrology consulted.   Diagnosed with otitis media and ruptured eardrum.  Right-sided weakness believed to be recrudescence of old spinal cord injury symptoms in the setting of infection.   He underwent Arthrocentesis of right knee, synovial fluid consistent with septic arthritis, underwent irrigation and debridement by Dr. [name] on 1/9. ID consulted recommend 4 weeks of IV antibiotics with HD> Vancomycin  and cefepime . End date 03/23/2024.   Spike fever 1/12 overnight. ID will follow up on patient today. ID also recommend Ortho evaluation for fluid collection of Right Shoulder. Ortho will consult IR for aspiration.       Assessment/Plan:   Sepsis due to otitis media with suspected TM perforation, right knee septic arthritis - Met sepsis criteria on admission; sepsis physiology has resolved - Seen by ENT, underwent CT of temporal bone without evidence of coalescent mastoiditis, bone lesion or abscess - Completed 10 days of antibiotics for otitis media  -needs follow up with ENT on January 5 -- 12th. I sent message through secure chat to Dr [name].  - Treated with  Ciprodex  drops, ENT recommend it for 14 days. Completed.  -Seen by ENT Dr. Penne [name], recommend to follow-up with him after discharge. -He  anticipates aural toilet in the office with microscope and updated hearing test.     Right-sided weakness History of cervical decompression corpectomy C4 by [name] April 2025 Remote left occipital infarct - There was some concern for recurrent spinal cord injury, neurosurgery and neurology were consulted.   -A spinal MRI showed no new cord injury. - It was thought that his symptoms were recrudescence of old  symptoms due to  acute infection.    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 Had knee aspiration and injection on  1/06  -knee synovial fluid, no growth to date.  -WBC Synovial fluid notice to be 71,000.  -Treated with Augmentin  and Linezolid .  -ID consulted , recommend IV antibiotics with HD for 4 weeks.  Currently on vancomycin  and cefepime ,  Monday Wednesday and Friday with hemodialysis.  As per ID, continue current dose of vancomycin  and cefepime  with hemodialysis through 04/10/24 Ortho consulted for evaluation of fluid collection right shoulder in setting of persistent fever.  -IR was consulted and small amount of fluid in soft tissue anterior and posterior to right shoulder aspirated. - Fluid culture showed no growth; WBC count 93,000 -Seen by Dr. [name], underwent arthroscopic I&D with placement of antibiotic beads and drain in right shoulder -Seen by Ortho today, plan to keep sling for the weekend, okay to discontinue sling on Monday and begin functional activities in the right arm. As per Ortho, Although not an ideal treatment for this particular problem the plan at this time would be to finish out a course  of IV and p.o. antibiotics per infectious disease and reaspirate the shoulder in approximately 8 weeks    -Peripheral vascular disease [name]/p angioplasty October 2025; - Continue aspirin  Lipitor and Plavix    ESRD on hemodialysis:  MWF - Nephrology following.   -Lokelma   daily  Continue with HD.    Chronic hypotension, asymptomatic -Continue midodrine  Refuse meds at times.  ECHO normal EF. 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  Per cardiology small mobile echodensity on the ventricular aspect of the aortic valve is likely degenerative changes of the valve.  Also blood cultures has been negative. -spike fever, repeat blood cultures.  -ID informed of echo finding.  -No plan to do TEE as this would not change management as per ID.  He would not be candidate for CT surgery and plan to get 6 weeks of vancomycin  and cefepime .   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 He would not accept blood transfusion do to religious believes.  Hemoglobin is stable at 8.2        DVT prophylaxis: Heparin   Medications     (feeding supplement) PROSource Plus  30 mL Oral BID BM   aspirin  EC  81 mg Oral Daily   atorvastatin   10 mg Oral Daily   bupivacaine (PF)  10 mL Infiltration Once   Chlorhexidine  Gluconate Cloth  6 each Topical Q0600   Chlorhexidine  Gluconate Cloth  6 each Topical Q0600   cinacalcet   120 mg Oral Q breakfast   clopidogrel   75 mg Oral Daily   darbepoetin (ARANESP ) injection - DIALYSIS  100 mcg Subcutaneous Q Fri-1800   docusate sodium   100 mg Oral BID   feeding supplement (KATE FARMS STANDARD 1.4) Liquid  325 mL Oral Daily   ferrous sulfate   325 mg Oral Q breakfast   folic acid   1 mg Oral Daily   gabapentin   300 mg Oral QHS   insulin  aspart  0-6 Units Subcutaneous TID WC   lidocaine   1 patch Transdermal Q24H   lidocaine   1 patch Transdermal Q24H   midodrine   10 mg Oral TID WC   multivitamin  1 tablet Oral QHS   pantoprazole   40 mg Oral BID AC   psyllium  1 packet Oral Daily    sevelamer  carbonate  2,400 mg Oral TID WC   sodium chloride  flush  3 mL Intravenous Q12H     Data Reviewed:   CBG:  Recent Labs  Lab 02/29/24 0826 02/29/24 1158 02/29/24 1646 02/29/24 2220 03/01/24 0752  GLUCAP 97 121* 93 101* 87    SpO2: 100 % O2 Flow Rate (L/min): 2 L/min    Vitals:   03/01/24 0015 03/01/24 0500 03/01/24 0510 03/01/24 0753  BP: 109/64  108/68 107/72  Pulse: 72  73 69  Resp: 18  18 20   Temp: 98.2 F (36.8 C)  98.2 F (36.8 C) 98.1 F (36.7 C)  TempSrc:      SpO2: 98%  97% 100%  Weight:  92.4 kg    Height:          Data Reviewed:  Basic Metabolic Panel: Recent Labs  Lab 02/26/24 0304 02/27/24 0301 02/28/24 0316 02/28/24 1501 02/29/24 0655 03/01/24 0411  NA 133* 133* 132*  133* 134* 134* 134*  K 5.0 4.2 4.8  4.6 4.4 4.8 4.9  CL 89* 91* 89*  90* 94* 95* 94*  CO2 27 28 26  28   --  25 26  GLUCOSE 116* 87 88  96 86 96 92  BUN 60* 33* 54*  53* 25* 35* 53*  CREATININE 8.43* 5.80* 7.54*  7.70* 4.30* 5.32* 6.80*  CALCIUM  8.2* 8.6* 8.7*  8.7*  --  9.1 9.1  PHOS 5.5* 4.3 5.2*  --  4.7* 4.3    CBC: Recent Labs  Lab 02/23/24 1053 02/24/24 0757 02/27/24 0301 02/28/24 0316 02/28/24 0731 02/28/24 1501  WBC 9.9 9.5 6.2 7.0 8.0  --   NEUTROABS  --   --   --   --  5.6  --   HGB 7.9* 8.1* 7.8* 8.3* 7.3* 8.2*  HCT 24.7* 25.3* 24.3* 26.2* 22.3* 24.0*  MCV 90.8 91.0 91.0 90.0 88.5  --   PLT 474* 478* 335 324 350  --     LFT Recent Labs  Lab 02/26/24 0304 02/27/24 0301 02/28/24 0316 02/29/24 0655 03/01/24 0411  AST  --  18  --   --   --   ALT  --  <5  --   --   --   ALKPHOS  --  98  --   --   --   BILITOT  --  0.4  --   --   --   PROT  --  7.8  --   --   --   ALBUMIN  2.9* 3.0* 3.1* 2.9* 2.8*     Antibiotics: Anti-infectives (From admission, onward)    Start     Dose/Rate Route Frequency Ordered Stop   02/28/24 1643  gentamicin  (GARAMYCIN ) injection  Status:  Discontinued          As needed 02/28/24 1643 02/28/24  1719   02/28/24 1627  vancomycin  (VANCOCIN ) powder  Status:  Discontinued          As needed 02/28/24 1627 02/28/24 1719   02/27/24 0745  ceFAZolin  (ANCEF ) IVPB 2g/100 mL premix  Status:  Discontinued        2 g 200 mL/hr over 30 Minutes Intravenous On call to O.R. 02/27/24 0647 02/28/24 0559   02/26/24 1200  vancomycin  (VANCOCIN ) IVPB 1000 mg/200 mL premix        1,000 mg 200 mL/hr over 60 Minutes Intravenous Every M-W-F (Hemodialysis) 02/24/24 0951 03/23/24 2359   02/24/24 1500  vancomycin  (VANCOREADY) IVPB 2000 mg/400 mL        2,000 mg 200 mL/hr over 120 Minutes Intravenous  Once 02/24/24 0945 02/24/24 1854   02/24/24 1200  ceFEPIme  (MAXIPIME ) 2 g in sodium chloride  0.9 % 100 mL IVPB        2 g 200 mL/hr over 30 Minutes Intravenous Every M-W-F (Hemodialysis) 02/24/24 0945 03/23/24 2359   02/24/24 0000  ceFEPime  (MAXIPIME ) IVPB        2 g Intravenous Every M-W-F (Hemodialysis) 02/24/24 1121 03/23/24 2359   02/24/24 0000  vancomycin  IVPB        1,000 mg Intravenous Every M-W-F (Hemodialysis) 02/24/24 1121 03/23/24 2359   02/22/24 1000  amoxicillin -clavulanate (AUGMENTIN ) 500-125 MG per tablet 1 tablet  Status:  Discontinued        1 tablet Oral 2 times daily 02/22/24 0935 02/24/24 0945   02/22/24 0600  ceFAZolin  (ANCEF ) IVPB 2g/100 mL premix  Status:  Discontinued        2 g 200 mL/hr over 30 Minutes Intravenous On call to O.R. 02/21/24  1200 02/21/24 1605   02/21/24 2200  amoxicillin -clavulanate (AUGMENTIN ) 500-125 MG per tablet 1 tablet  Status:  Discontinued        1 tablet Oral Daily at bedtime 02/20/24 1042 02/22/24 0935   02/20/24 1130  linezolid  (ZYVOX ) tablet 600 mg  Status:  Discontinued        600 mg Oral Every 12 hours 02/20/24 1037 02/24/24 0945   02/20/24 1130  amoxicillin -clavulanate (AUGMENTIN ) 875-125 MG per tablet 1 tablet  Status:  Discontinued        1 tablet Oral Every 12 hours 02/20/24 1038 02/20/24 1041   02/20/24 1130  amoxicillin -clavulanate (AUGMENTIN ) 500-125  MG per tablet 1 tablet        1 tablet Oral  Once 02/20/24 1042 02/20/24 1329   02/19/24 1545  amoxicillin -clavulanate (AUGMENTIN ) 500-125 MG per tablet 1 tablet  Status:  Discontinued        1 tablet Oral  Once 02/19/24 1527 02/20/24 1038   02/17/24 0615  amoxicillin -clavulanate (AUGMENTIN ) 500-125 MG per tablet 1 tablet        1 tablet Oral Every 12 hours 02/17/24 0556 02/18/24 2151   02/09/24 0600  piperacillin -tazobactam (ZOSYN ) IVPB 2.25 g  Status:  Discontinued        2.25 g 100 mL/hr over 30 Minutes Intravenous Every 8 hours 02/09/24 0135 02/17/24 0556   02/08/24 2200  amoxicillin -clavulanate (AUGMENTIN ) 875-125 MG per tablet 1 tablet  Status:  Discontinued        1 tablet Oral Every 12 hours 02/08/24 2104 02/09/24 0135   02/08/24 2200  piperacillin -tazobactam (ZOSYN ) IVPB 3.375 g        3.375 g 100 mL/hr over 30 Minutes Intravenous  Once 02/08/24 2153 02/08/24 2314   02/08/24 0000  amoxicillin -clavulanate (AUGMENTIN ) 875-125 MG tablet  Status:  Discontinued        1 tablet Oral Every 12 hours 02/08/24 1908 02/24/24         CONSULTS nephrology, ID  Code Status: Full code  Family Communication: No family at bedside     Subjective   Patient seen and examined, denies pain.   Objective    Physical Examination:  Appears in no acute distress S1-S2, regular, no murmur auscultated Lungs clear to auscultation bilaterally Abdomen is soft, nontender, no organomegaly Extremities no edema          [name] Donnie Gedeon   [facility] If 7PM-7AM, please contact night-coverage at [email or web address], Office  [phone]   03/01/2024, 9:36 AM  LOS: 19 days

## 2024-03-02 ENCOUNTER — Encounter (HOSPITAL_COMMUNITY): Payer: Self-pay | Admitting: Orthopedic Surgery

## 2024-03-02 LAB — RENAL FUNCTION PANEL
Albumin: 2.9 g/dL — ABNORMAL LOW (ref 3.5–5.0)
Anion gap: 16 — ABNORMAL HIGH (ref 5–15)
BUN: 66 mg/dL — ABNORMAL HIGH (ref 8–23)
CO2: 25 mmol/L (ref 22–32)
Calcium: 9.4 mg/dL (ref 8.9–10.3)
Chloride: 91 mmol/L — ABNORMAL LOW (ref 98–111)
Creatinine, Ser: 8.34 mg/dL — ABNORMAL HIGH (ref 0.61–1.24)
GFR, Estimated: 7 mL/min — ABNORMAL LOW
Glucose, Bld: 86 mg/dL (ref 70–99)
Phosphorus: 4.1 mg/dL (ref 2.5–4.6)
Potassium: 5.2 mmol/L — ABNORMAL HIGH (ref 3.5–5.1)
Sodium: 132 mmol/L — ABNORMAL LOW (ref 135–145)

## 2024-03-02 LAB — VANCOMYCIN, RANDOM: Vancomycin Rm: 20 ug/mL

## 2024-03-02 LAB — GLUCOSE, CAPILLARY
Glucose-Capillary: 81 mg/dL (ref 70–99)
Glucose-Capillary: 126 mg/dL — ABNORMAL HIGH (ref 70–99)
Glucose-Capillary: 91 mg/dL (ref 70–99)

## 2024-03-02 MED ORDER — CEFEPIME IV (FOR PTA / DISCHARGE USE ONLY): 2.0000 g | INTRAVENOUS | Status: AC

## 2024-03-02 MED ORDER — HEPARIN SODIUM (PORCINE) 1000 UNIT/ML IJ SOLN
INTRAMUSCULAR | Status: AC
  Filled 2024-03-02: qty 1

## 2024-03-02 MED ORDER — HEPARIN SODIUM (PORCINE) 1000 UNIT/ML IJ SOLN
INTRAMUSCULAR | Status: AC
  Filled 2024-03-02: qty 2

## 2024-03-02 MED ORDER — HEPARIN SODIUM (PORCINE) 1000 UNIT/ML IJ SOLN: 1000.0000 [IU] | Freq: Once | INTRAMUSCULAR | Status: DC | PRN

## 2024-03-02 MED ORDER — VANCOMYCIN IV (FOR PTA / DISCHARGE USE ONLY): 1000.0000 mg | INTRAVENOUS | Status: AC

## 2024-03-02 MED ORDER — DARBEPOETIN ALFA 100 MCG/0.5ML IJ SOSY
100.0000 ug | PREFILLED_SYRINGE | INTRAMUSCULAR | Status: DC
  Administered 2024-03-02: 100 ug via SUBCUTANEOUS
  Filled 2024-03-02: qty 0.5

## 2024-03-02 MED ORDER — HEPARIN SODIUM (PORCINE) 1000 UNIT/ML IJ SOLN
2000.0000 [IU] | Freq: Once | INTRAMUSCULAR | Status: AC
  Administered 2024-03-02: 2000 [IU] via INTRAVENOUS

## 2024-03-02 MED ORDER — VANCOMYCIN HCL IN DEXTROSE 1-5 GM/200ML-% IV SOLN
INTRAVENOUS | Status: AC
  Filled 2024-03-02: qty 200

## 2024-03-02 NOTE — Plan of Care (Signed)
" °  Problem: Education: Goal: Knowledge of General Education information will improve Description: Including pain rating scale, medication(s)/side effects and non-pharmacologic comfort measures Outcome: Progressing   Problem: Health Behavior/Discharge Planning: Goal: Ability to manage health-related needs will improve Outcome: Progressing   Problem: Health Behavior/Discharge Planning: Goal: Ability to manage health-related needs will improve Outcome: Progressing   Problem: Clinical Measurements: Goal: Ability to maintain clinical measurements within normal limits will improve Outcome: Progressing Goal: Will remain free from infection Outcome: Progressing Goal: Diagnostic test results will improve Outcome: Progressing Goal: Respiratory complications will improve Outcome: Progressing Goal: Cardiovascular complication will be avoided Outcome: Progressing   Problem: Activity: Goal: Risk for activity intolerance will decrease Outcome: Progressing   Problem: Coping: Goal: Level of anxiety will decrease Outcome: Progressing   Problem: Skin Integrity: Goal: Demonstrates signs of wound healing without infection Outcome: Progressing   Problem: Respiratory: Goal: Will regain and/or maintain adequate ventilation Outcome: Progressing Goal: Respiratory status will improve Outcome: Progressing   Problem: Clinical Measurements: Goal: Ability to maintain clinical measurements within normal limits Outcome: Progressing Goal: Postoperative complications will be avoided or minimized Outcome: Progressing   "

## 2024-03-02 NOTE — Progress Notes (Signed)
 [facility]  PROGRESS NOTE  Carl Mcintyre Sr. FMW:[id number] DOB: October 17, 1960 DOA: 02/08/2024 PCP: [name], MD   Brief HPI:   64 year old with past medical history significant for ESRD on hemodialysis MWF, PVD s/p angioplasty right S FA October 2025 for ischemic rest pain as well as C4 spinal fracture April 2025 with incomplete spinal cord injury requiring C4 corpectomy, fusion and interbody cage by Dr. [name], wheelchair-bound for a time but progressed by Agurs to walking with a cane and driving, presented with right arm and leg weakness, fall as well as posterior neck pain, fever, ear drainage.   Admitted started on antibiotics, neurosurgery, neurology orthopedic ENT and nephrology consulted.   Diagnosed with otitis media and ruptured eardrum.  Right-sided weakness believed to be recrudescence of old spinal cord injury symptoms in the setting of infection.   He underwent Arthrocentesis of right knee, synovial fluid consistent with septic arthritis, underwent irrigation and debridement by Dr. [name] on 1/9. ID consulted recommend 4 weeks of IV antibiotics with HD> Vancomycin  and cefepime . End date 03/23/2024.   Spike fever 1/12 overnight. ID will follow up on patient today. ID also recommend Ortho evaluation for fluid collection of Right Shoulder. Ortho will consult IR for aspiration.       Assessment/Plan:   Sepsis due to otitis media with suspected TM perforation, right knee septic arthritis - Met sepsis criteria on admission; sepsis physiology has resolved - Seen by ENT, underwent CT of temporal bone without evidence of coalescent mastoiditis, bone lesion or abscess - Completed 10 days of antibiotics for otitis media  -needs follow up with ENT on January 5 -- 12th. I sent message through secure chat to Dr [name].  - Treated with  Ciprodex  drops, ENT recommend it for 14 days. Completed.  -Seen by ENT Dr. Penne [name], recommend to follow-up with him after discharge. -He  anticipates aural toilet in the office with microscope and updated hearing test.     Right-sided weakness History of cervical decompression corpectomy C4 by [name] April 2025 Remote left occipital infarct - There was some concern for recurrent spinal cord injury, neurosurgery and neurology were consulted.   -A spinal MRI showed no new cord injury. - It was thought that his symptoms were recrudescence of old  symptoms due to  acute infection.    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 Had knee aspiration and injection on  1/06  -knee synovial fluid, no growth to date.  -WBC Synovial fluid notice to be 71,000.  -Treated with Augmentin  and Linezolid .  -ID consulted , recommend IV antibiotics with HD for 4 weeks.  Currently on vancomycin  and cefepime ,  Monday Wednesday and Friday with hemodialysis.  As per ID, continue current dose of vancomycin  and cefepime  with hemodialysis through 04/10/24 Ortho consulted for evaluation of fluid collection right shoulder in setting of persistent fever.  -IR was consulted and small amount of fluid in soft tissue anterior and posterior to right shoulder aspirated. - Fluid culture showed no growth; WBC count 93,000 -Seen by Dr. [name], underwent arthroscopic I&D with placement of antibiotic beads and drain in right shoulder -Seen by Ortho today, plan to keep sling for the weekend, okay to discontinue sling on Monday and begin functional activities in the right arm. As per Ortho, Although not an ideal treatment for this particular problem the plan at this time would be to finish out a course  of IV and p.o. antibiotics per infectious disease and reaspirate the shoulder in approximately 8 weeks    -Peripheral vascular disease s/p angioplasty October 2025; - Continue aspirin  Lipitor and Plavix    ESRD on hemodialysis:  MWF - Nephrology following.   -Lokelma   daily  Continue with HD.    Chronic hypotension, asymptomatic -Continue midodrine  Refuse meds at times.  ECHO normal EF. 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  Per cardiology small mobile echodensity on the ventricular aspect of the aortic valve is likely degenerative changes of the valve.  Also blood cultures has been negative. -spike fever, repeat blood cultures.  -ID informed of echo finding.  -No plan to do TEE as this would not change management as per ID.  He would not be candidate for CT surgery and plan to get 6 weeks of vancomycin  and cefepime .   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 He would not accept blood transfusion do to religious believes.  Hemoglobin is stable at 8.2        DVT prophylaxis: Heparin   Medications     (feeding supplement) PROSource Plus  30 mL Oral BID BM   aspirin  EC  81 mg Oral Daily   atorvastatin   10 mg Oral Daily   bupivacaine (PF)  10 mL Infiltration Once   Chlorhexidine  Gluconate Cloth  6 each Topical Q0600   Chlorhexidine  Gluconate Cloth  6 each Topical Q0600   cinacalcet   120 mg Oral Q breakfast   clopidogrel   75 mg Oral Daily   darbepoetin (ARANESP ) injection - DIALYSIS  100 mcg Subcutaneous Q Fri-1800   docusate sodium   100 mg Oral BID   feeding supplement (KATE FARMS STANDARD 1.4) Liquid  325 mL Oral Daily   ferrous sulfate   325 mg Oral Q breakfast   folic acid   1 mg Oral Daily   gabapentin   300 mg Oral QHS   insulin  aspart  0-6 Units Subcutaneous TID WC   lidocaine   1 patch Transdermal Q24H   lidocaine   1 patch Transdermal Q24H   midodrine   10 mg Oral TID WC   multivitamin  1 tablet Oral QHS   pantoprazole   40 mg Oral BID AC   psyllium  1 packet Oral Daily    sevelamer  carbonate  2,400 mg Oral TID WC   sodium chloride  flush  3 mL Intravenous Q12H     Data Reviewed:   CBG:  Recent Labs  Lab 02/29/24 2220 03/01/24 0752 03/01/24 1152 03/01/24 1600 03/01/24 2319  GLUCAP 101* 87 91 85 96    SpO2: 97 % O2 Flow Rate (L/min): 2 L/min    Vitals:   03/02/24 0537 03/02/24 0742 03/02/24 0750 03/02/24 0800  BP: 109/76 (!) 90/55 (!) 89/56 123/63  Pulse: 98   79  Resp: 18 11 19 11   Temp: 99.6 F (37.6 C) 99.2 F (37.3 C)    TempSrc:  Oral    SpO2: 98% 100%  97%  Weight:  87.2 kg    Height:          Data Reviewed:  Basic Metabolic Panel: Recent Labs  Lab 02/27/24 0301 02/28/24 0316 02/28/24 1501 02/29/24 0655 03/01/24 0411 03/02/24 0420  NA 133* 132*  133* 134* 134* 134* 132*  K 4.2 4.8  4.6  4.4 4.8 4.9 5.2*  CL 91* 89*  90* 94* 95* 94* 91*  CO2 28 26  28   --  25 26 25   GLUCOSE 87 88  96 86 96 92 86  BUN 33* 54*  53* 25* 35* 53* 66*  CREATININE 5.80* 7.54*  7.70* 4.30* 5.32* 6.80* 8.34*  CALCIUM  8.6* 8.7*  8.7*  --  9.1 9.1 9.4  PHOS 4.3 5.2*  --  4.7* 4.3 4.1    CBC: Recent Labs  Lab 02/27/24 0301 02/28/24 0316 02/28/24 0731 02/28/24 1501  WBC 6.2 7.0 8.0  --   NEUTROABS  --   --  5.6  --   HGB 7.8* 8.3* 7.3* 8.2*  HCT 24.3* 26.2* 22.3* 24.0*  MCV 91.0 90.0 88.5  --   PLT 335 324 350  --     LFT Recent Labs  Lab 02/27/24 0301 02/28/24 0316 02/29/24 0655 03/01/24 0411 03/02/24 0420  AST 18  --   --   --   --   ALT <5  --   --   --   --   ALKPHOS 98  --   --   --   --   BILITOT 0.4  --   --   --   --   PROT 7.8  --   --   --   --   ALBUMIN  3.0* 3.1* 2.9* 2.8* 2.9*     Antibiotics: Anti-infectives (From admission, onward)    Start     Dose/Rate Route Frequency Ordered Stop   02/28/24 1643  gentamicin  (GARAMYCIN ) injection  Status:  Discontinued          As needed 02/28/24 1643 02/28/24 1719   02/28/24 1627  vancomycin  (VANCOCIN ) powder  Status:  Discontinued          As needed  02/28/24 1627 02/28/24 1719   02/27/24 0745  ceFAZolin  (ANCEF ) IVPB 2g/100 mL premix  Status:  Discontinued        2 g 200 mL/hr over 30 Minutes Intravenous On call to O.R. 02/27/24 0647 02/28/24 0559   02/26/24 1200  vancomycin  (VANCOCIN ) IVPB 1000 mg/200 mL premix        1,000 mg 200 mL/hr over 60 Minutes Intravenous Every M-W-F (Hemodialysis) 02/24/24 0951 03/23/24 2359   02/24/24 1500  vancomycin  (VANCOREADY) IVPB 2000 mg/400 mL        2,000 mg 200 mL/hr over 120 Minutes Intravenous  Once 02/24/24 0945 02/24/24 1854   02/24/24 1200  ceFEPIme  (MAXIPIME ) 2 g in sodium chloride  0.9 % 100 mL IVPB        2 g 200 mL/hr over 30 Minutes Intravenous Every M-W-F (Hemodialysis) 02/24/24 0945 03/23/24 2359   02/24/24 0000  ceFEPime  (MAXIPIME ) IVPB        2 g Intravenous Every M-W-F (Hemodialysis) 02/24/24 1121 03/23/24 2359   02/24/24 0000  vancomycin  IVPB        1,000 mg Intravenous Every M-W-F (Hemodialysis) 02/24/24 1121 03/23/24 2359   02/22/24 1000  amoxicillin -clavulanate (AUGMENTIN ) 500-125 MG per tablet 1 tablet  Status:  Discontinued        1 tablet Oral 2 times daily 02/22/24 0935 02/24/24 0945   02/22/24 0600  ceFAZolin  (ANCEF ) IVPB 2g/100 mL premix  Status:  Discontinued        2 g 200 mL/hr over 30 Minutes Intravenous On call to O.R. 02/21/24 1200 02/21/24 1605   02/21/24 2200  amoxicillin -clavulanate (AUGMENTIN ) 500-125 MG per tablet 1 tablet  Status:  Discontinued  1 tablet Oral Daily at bedtime 02/20/24 1042 02/22/24 0935   02/20/24 1130  linezolid  (ZYVOX ) tablet 600 mg  Status:  Discontinued        600 mg Oral Every 12 hours 02/20/24 1037 02/24/24 0945   02/20/24 1130  amoxicillin -clavulanate (AUGMENTIN ) 875-125 MG per tablet 1 tablet  Status:  Discontinued        1 tablet Oral Every 12 hours 02/20/24 1038 02/20/24 1041   02/20/24 1130  amoxicillin -clavulanate (AUGMENTIN ) 500-125 MG per tablet 1 tablet        1 tablet Oral  Once 02/20/24 1042 02/20/24 1329   02/19/24 1545   amoxicillin -clavulanate (AUGMENTIN ) 500-125 MG per tablet 1 tablet  Status:  Discontinued        1 tablet Oral  Once 02/19/24 1527 02/20/24 1038   02/17/24 0615  amoxicillin -clavulanate (AUGMENTIN ) 500-125 MG per tablet 1 tablet        1 tablet Oral Every 12 hours 02/17/24 0556 02/18/24 2151   02/09/24 0600  piperacillin -tazobactam (ZOSYN ) IVPB 2.25 g  Status:  Discontinued        2.25 g 100 mL/hr over 30 Minutes Intravenous Every 8 hours 02/09/24 0135 02/17/24 0556   02/08/24 2200  amoxicillin -clavulanate (AUGMENTIN ) 875-125 MG per tablet 1 tablet  Status:  Discontinued        1 tablet Oral Every 12 hours 02/08/24 2104 02/09/24 0135   02/08/24 2200  piperacillin -tazobactam (ZOSYN ) IVPB 3.375 g        3.375 g 100 mL/hr over 30 Minutes Intravenous  Once 02/08/24 2153 02/08/24 2314   02/08/24 0000  amoxicillin -clavulanate (AUGMENTIN ) 875-125 MG tablet  Status:  Discontinued        1 tablet Oral Every 12 hours 02/08/24 1908 02/24/24         CONSULTS nephrology, ID  Code Status: Full code  Family Communication: No family at bedside     Subjective   Patient seen and examined, denies any complaints.  Objective    Physical Examination:  General-appears in no acute distress Heart-S1-S2, regular, no murmur auscultated Lungs-clear to auscultation bilaterally, no wheezing or crackles auscultated Abdomen-soft, nontender, no organomegaly Extremities-no edema in the lower extremities Neuro-alert, oriented x3, no focal deficit noted         [name] S Zenas Santa   [facility] If 7PM-7AM, please contact night-coverage at [email or web address], Office  [phone]   03/02/2024, 8:10 AM  LOS: 20 days

## 2024-03-02 NOTE — Progress Notes (Signed)
 Received patient in bed to unit.  Alert and oriented.  Informed consent signed and in chart.   TX duration:3.5 hours  Patient tolerated well.  Transported back to the room  Alert, without acute distress.  Hand-off given to patient's nurse.   Access used: Right forearm fistula Access issues: none  Total UF removed: 2L Medication(s) given: Vancomycin  and MAxipime    03/02/24 1127  Vitals  Temp 97.6 F (36.4 C)  Temp Source Oral  BP (!) 160/79  Pulse Rate 85  ECG Heart Rate 85  Resp 16  Weight 85.2 kg  Type of Weight Post-Dialysis  Oxygen Therapy  SpO2 100 %  During Treatment Monitoring  Duration of HD Treatment -hour(s) 3.5 hour(s)  HD Safety Checks Performed Yes  Intra-Hemodialysis Comments Tx completed  Post Treatment  Dialyzer Clearance Clear  Liters Processed 84  Fluid Removed (mL) 2000 mL  Tolerated HD Treatment Yes  AVG/AVF Arterial Site Held (minutes) 7 minutes  AVG/AVF Venous Site Held (minutes) 7 minutes  Fistula / Graft Right Forearm  No placement date or time found.   Orientation: Right  Access Location: Forearm  Site Condition No complications  Fistula / Graft Assessment Present;Thrill;Bruit  Status Patent;Deaccessed  Drainage Description None     Camellia Brasil LPN [facility]

## 2024-03-02 NOTE — Progress Notes (Signed)
 PHARMACY CONSULT NOTE FOR:  OUTPATIENT  PARENTERAL ANTIBIOTIC THERAPY with HD   Indication: Right knee septic arthritis and right shoulder PJI Regimen: Vancomycin  1000 mg with HD every MWF + Cefepime  2 gm with HD every MWF  End date: 04/10/24  No formal OPAT will be done as patient will receive antibiotics with dialysis. An informational order will be placed for discharge.       Thank you for allowing pharmacy to be a part of this patient's care.  Damien Quiet, PharmD, BCPS, BCIDP Infectious Diseases Clinical Pharmacist Phone: [phone] 03/02/2024, 1:29 PM

## 2024-03-02 NOTE — Progress Notes (Signed)
 Physical Therapy Treatment Patient Details Name: Carl Kise Sr. MRN: [id number] DOB: January 15, 1961 Today's Date: 03/02/2024   History of Present Illness Pt is a 64 y.o. male who presented 02/07/24 with progressive R-sided weakness and pain along with L ear pain and drainage. Pt septic with L acute otitis media. MRI brain showed no acute findings. MRI cervical spine demonstrated no interval change from prior surgery and no clear etiology for current weakness. Imaging did note prior left-sided strokes, without evidence of acute stroke or recrudescence. Neurosurgery consulted and recommends no acute intervention. R weakness suspected recrudescence of prior symptoms in setting of severe infection. Underwent R knee I&D on 02/21/24 for septic arthritis.  PMH includes: recurrent GIB, epilepsy, PRES, TIA, HTN, renal cell carcinoma, CVA, OSA, ESRD, DM II, Ischemic colitis, anterior cervical corpectomy C4 and C3-C5 fusion (Dr. [name], 05/25/2023), PAD s/p angioplasty/stenting right SFA and popliteal arteries 12/09/2023 for critical limb ischemia.    PT Comments  Slow progress towards goals due to multiple set backs medically including septic arthritis of the R knee and infection of the hardware in R shoulder replacement. Pt currently Min A for bed mobility with assist at RLE and Mod to Max A for partial sit to stand from EOB. Pt has very little strength in the R quad; worked on quad set and short arc quad in order to improve activation of the R quad during session with initially a good contraction that quickly weakened to barely a palpable contraction after ~ contractions before needing to rest. Due to pt current functional status, home set up and available assistance at home recommending skilled physical therapy services > 3 hours/day in order to address strength, balance and functional mobility to decrease risk for falls, injury, immobility, skin break down and re-hospitalization.      If plan is discharge home,  recommend the following: Two people to help with walking and/or transfers;Two people to help with bathing/dressing/bathroom;Assistance with cooking/housework;Direct supervision/assist for medications management;Direct supervision/assist for financial management;Assist for transportation;Help with stairs or ramp for entrance     Equipment Recommendations  BSC/3in1       Precautions / Restrictions Precautions Precautions: Fall Recall of Precautions/Restrictions: Impaired Precaution/Restrictions Comments: watch HR, R knee buckles Restrictions Weight Bearing Restrictions Per Provider Order: Yes RUE Weight Bearing Per Provider Order: Weight bearing as tolerated RLE Weight Bearing Per Provider Order: Weight bearing as tolerated Other Position/Activity Restrictions: Per Dr. [name] on 1/19 pt can perform ROM and WB through RUE as tolerated     Mobility  Bed Mobility Overal bed mobility: Needs Assistance Bed Mobility: Supine to Sit, Sit to Supine     Supine to sit: Min assist Sit to supine: Min assist   General bed mobility comments: assistance with RLE    Transfers Overall transfer level: Needs assistance Equipment used: Rolling walker (2 wheels), None Transfers: Sit to/from Stand Sit to Stand: Mod assist, Max assist           General transfer comment: attempted 3x from EOB, Max A with RW with difficutly getting the RUE up onto the walker. Mod A with face to face sit to stand with pt holding onto therapist arm with R hand and pushing up from EOB with L hand for 2 attempts. Pt was unable to get fully to standing for any trial.    Ambulation/Gait   General Gait Details: unable at this time.   Modified Rankin (Stroke Patients Only) Modified Rankin (Stroke Patients Only) Pre-Morbid Rankin Score: Slight disability Modified Rankin: Moderately severe  disability     Balance Overall balance assessment: Needs assistance Sitting-balance support: Feet supported Sitting balance-Leahy  Scale: Good Sitting balance - Comments: EOB   Standing balance support: Bilateral upper extremity supported Standing balance-Leahy Scale: Poor Standing balance comment: UE support and assist for balance in standing       Communication Communication Communication: No apparent difficulties  Cognition Arousal: Alert Behavior During Therapy: WFL for tasks assessed/performed   PT - Cognitive impairments: No apparent impairments     Following commands: Intact      Cueing Cueing Techniques: Verbal cues, Tactile cues, Visual cues         Pertinent Vitals/Pain Pain Assessment Pain Assessment: Faces Faces Pain Scale: Hurts even more Breathing: occasional labored breathing, short period of hyperventilation Negative Vocalization: occasional moan/groan, low speech, negative/disapproving quality Facial Expression: smiling or inexpressive Body Language: relaxed Consolability: no need to console PAINAD Score: 2 Pain Location: R knee and R shoulder Pain Descriptors / Indicators: Aching, Discomfort, Tender Pain Intervention(s): Monitored during session, Limited activity within patient's tolerance, Repositioned     PT Goals (current goals can now be found in the care plan section) Acute Rehab PT Goals Patient Stated Goal: Get back to walking PT Goal Formulation: With patient Time For Goal Achievement: 03/07/24 Potential to Achieve Goals: Good Progress towards PT goals: Progressing toward goals    Frequency    Min 3X/week      PT Plan  Continue with current POC        AM-PAC PT 6 Clicks Mobility   Outcome Measure  Help needed turning from your back to your side while in a flat bed without using bedrails?: A Little Help needed moving from lying on your back to sitting on the side of a flat bed without using bedrails?: A Little Help needed moving to and from a bed to a chair (including a wheelchair)?: Total Help needed standing up from a chair using your arms (e.g.,  wheelchair or bedside chair)?: Total Help needed to walk in hospital room?: Total Help needed climbing 3-5 steps with a railing? : Total 6 Click Score: 10    End of Session Equipment Utilized During Treatment: Gait belt Activity Tolerance: Patient limited by fatigue;Patient limited by pain Patient left: in bed;with call bell/phone within reach Nurse Communication: Mobility status;Need for lift equipment;Weight bearing status PT Visit Diagnosis: Muscle weakness (generalized) (M62.81);Pain;Difficulty in walking, not elsewhere classified (R26.2) Pain - Right/Left: Right Pain - part of body: Knee;Shoulder     Time: 8453-8384 PT Time Calculation (min) (ACUTE ONLY): 29 min  Charges:    $Therapeutic Exercise: 8-22 mins $Therapeutic Activity: 8-22 mins PT General Charges $$ ACUTE PT VISIT: 1 Visit                     [name], DPT, CLT  Acute Rehabilitation Services Office: [phone] (Secure chat preferred)    [name] 03/02/2024, 5:04 PM

## 2024-03-02 NOTE — Progress Notes (Signed)
 PT Cancellation Note  Patient Details Name: Carl Isenhower Sr. MRN: [id number] DOB: 1960-07-15   Cancelled Treatment:    Reason Eval/Treat Not Completed: Patient at procedure or test/unavailable (Currently pt is in HD. Will follow up as able and appropriate.)  [name], DPT, CLT  Acute Rehabilitation Services Office: [phone] (Secure chat preferred)   [name] 03/02/2024, 10:33 AM

## 2024-03-02 NOTE — Progress Notes (Signed)
 Pharmacy Antibiotic Note  Carl Brisco Sr. is a 64 y.o. male admitted on 02/08/2024 with right septic knee s/p washout 1/9. Cultures showing no growth.   Pharmacy has been consulted for vancomycin  dosing. Patient is ESRD on HD MWF.   -pre-HF vancomycin  leve= 20 (goal 15-25) -HD today: 3.5hrs with BFR 400 -vancomycin  given post HD today  Plan: Cefepime  2 gm with HD MWF Continue Vancomycin  1000 mg every MWF with HD  Monitor dialysis sessions/tolerance ID planning 6 weeks of therapy (Last doses 04/10/24)   Height: 6' 1 (185.4 cm) Weight: 85.2 kg (187 lb 13.3 oz) IBW/kg (Calculated) : 79.9  Temp (24hrs), Avg:98.6 F (37 C), Min:97.6 F (36.4 C), Max:99.6 F (37.6 C)  Recent Labs  Lab 02/27/24 0301 02/28/24 0316 02/28/24 0731 02/28/24 1501 02/29/24 0655 03/01/24 0411 03/02/24 0420  WBC 6.2 7.0 8.0  --   --   --   --   CREATININE 5.80* 7.54*  7.70*  --  4.30* 5.32* 6.80* 8.34*  VANCORANDOM  --   --   --   --   --   --  20    Estimated Creatinine Clearance: 10 mL/min (A) (by C-G formula based on SCr of 8.34 mg/dL (H)).    Allergies[1]  Thank you for allowing pharmacy to be a part of this patients care.  Prentice Poisson, PharmD Clinical Pharmacist **Pharmacist phone directory can now be found on [email or web address] ([email or web address]).  Listed under [facility].      [1]  Allergies Allergen Reactions   Infed [Iron Dextran] Other (See Comments)    Decreased BP    Vicodin [Hydrocodone -Acetaminophen ] Other (See Comments)    Decreased BP   Hydrocodone  Other (See Comments)    Unable to recall reaction   Voltaren [Diclofenac] Other (See Comments)    Hypotension

## 2024-03-02 NOTE — Progress Notes (Addendum)
 " [facility] Progress Note   Subjective:    Seen and examined patient on HD. Tolerating UFG 2L. BP is 118/55. Denies SOB, CP, and N/V. Getting ABXs with HD. On Midodrine .  Objective Vitals:   03/02/24 0742 03/02/24 0750 03/02/24 0800 03/02/24 0830  BP: (!) 90/55 (!) 89/56 123/63 120/60  Pulse:   79 80  Resp: 11 19 11 12   Temp: 99.2 F (37.3 C)     TempSrc: Oral     SpO2: 100%  97% 99%  Weight: 87.2 kg     Height:       Physical Exam Gen: Awake, alert, NAD CVS: RRR Resp: unlabored, normal wob Abd: soft Ext: right knee dressing in place, no edema LE's, RUE in sling Neuro: awake, alert Dialysis access: RUE AVF  Filed Weights   03/01/24 0500 03/02/24 0500 03/02/24 0742  Weight: 92.4 kg 91.5 kg 87.2 kg    Intake/Output Summary (Last 24 hours) at 03/02/2024 0851 Last data filed at 03/01/2024 1900 Gross per 24 hour  Intake 240 ml  Output --  Net 240 ml    Additional Objective Labs: Basic Metabolic Panel: Recent Labs  Lab 02/29/24 0655 03/01/24 0411 03/02/24 0420  NA 134* 134* 132*  K 4.8 4.9 5.2*  CL 95* 94* 91*  CO2 25 26 25   GLUCOSE 96 92 86  BUN 35* 53* 66*  CREATININE 5.32* 6.80* 8.34*  CALCIUM  9.1 9.1 9.4  PHOS 4.7* 4.3 4.1   Liver Function Tests: Recent Labs  Lab 02/27/24 0301 02/28/24 0316 02/29/24 0655 03/01/24 0411 03/02/24 0420  AST 18  --   --   --   --   ALT <5  --   --   --   --   ALKPHOS 98  --   --   --   --   BILITOT 0.4  --   --   --   --   PROT 7.8  --   --   --   --   ALBUMIN  3.0*   < > 2.9* 2.8* 2.9*   < > = values in this interval not displayed.   No results for input(s): LIPASE, AMYLASE in the last 168 hours. CBC: Recent Labs  Lab 02/27/24 0301 02/28/24 0316 02/28/24 0731 02/28/24 1501  WBC 6.2 7.0 8.0  --   NEUTROABS  --   --  5.6  --   HGB 7.8* 8.3* 7.3* 8.2*  HCT 24.3* 26.2* 22.3* 24.0*  MCV 91.0 90.0 88.5  --   PLT 335 324 350  --    Blood Culture    Component Value Date/Time   SDES  TISSUE RIGHT SHOULDER 02/28/2024 1636   SPECREQUEST SWAB C INTRA ARTICULAR SUTURE 02/28/2024 1636   CULT  02/28/2024 1636    NO GROWTH 3 DAYS NO ANAEROBES ISOLATED; CULTURE IN PROGRESS FOR 5 DAYS Performed at [facility], [address], [place], [name]    REPTSTATUS PENDING 02/28/2024 1636    Cardiac Enzymes: No results for input(s): CKTOTAL, CKMB, CKMBINDEX, TROPONINI in the last 168 hours. CBG: Recent Labs  Lab 02/29/24 2220 03/01/24 0752 03/01/24 1152 03/01/24 1600 03/01/24 2319  GLUCAP 101* 87 91 85 96   Iron Studies: No results for input(s): IRON, TIBC, TRANSFERRIN, FERRITIN in the last 72 hours. Lab Results  Component Value Date   INR 1.1 05/27/2023   INR 1.1 05/25/2023   INR 1.1 09/17/2021   Studies/Results: No results found.  Medications:  ceFEPime  (  MAXIPIME ) IV Stopped (02/28/24 1249)   vancomycin  Stopped (02/28/24 1249)    (feeding supplement) PROSource Plus  30 mL Oral BID BM   aspirin  EC  81 mg Oral Daily   atorvastatin   10 mg Oral Daily   bupivacaine (PF)  10 mL Infiltration Once   Chlorhexidine  Gluconate Cloth  6 each Topical Q0600   Chlorhexidine  Gluconate Cloth  6 each Topical Q0600   cinacalcet   120 mg Oral Q breakfast   clopidogrel   75 mg Oral Daily   darbepoetin (ARANESP ) injection - DIALYSIS  100 mcg Subcutaneous Q Fri-1800   docusate sodium   100 mg Oral BID   feeding supplement (KATE FARMS STANDARD 1.4) Liquid  325 mL Oral Daily   ferrous sulfate   325 mg Oral Q breakfast   folic acid   1 mg Oral Daily   gabapentin   300 mg Oral QHS   insulin  aspart  0-6 Units Subcutaneous TID WC   lidocaine   1 patch Transdermal Q24H   lidocaine   1 patch Transdermal Q24H   midodrine   10 mg Oral TID WC   multivitamin  1 tablet Oral QHS   pantoprazole   40 mg Oral BID AC   psyllium  1 packet Oral Daily   sevelamer  carbonate  2,400 mg Oral TID WC   sodium chloride  flush  3 mL Intravenous Q12H    Dialysis Orders: MWF NW 4h   B400  87.3kg  2K bath  AVF   Heparin  3000 ESA: Mircera 50 q 2 wks (last 12/15) BMM: Calcitriol  2 mcg PO q HD, cinacalcet  120 mg daily   Assessment/Plan:  ESRD:  -on HD MWF. Pt requests 1st shift. Wants to hold off on recliner HD for now. On HD.    Volume/chronic hypotension:  -HD with midodrine  support, UF as tolerated   Anemia of ESRD:  -declines IV alb/ prbc's, hold off on IV Fe, increased ESA dose (hgb 8.2)   Hyperkalemia:  -persistent, mild, had been on lokelma --on hold for now, can resume if needed, k remains wnl   Septic arthritis R knee and right shoulder:  -SP I&D 1/09 per ortho, refused IV so getting po abx--per primary. S/p fluid collection of right shoulder 1/13, highly infectious. ID and ortho following, s/p right shoulder I&D with abx spacer placement -Discussed with pharmacy: plan to continue Vancomycin  IV 1GM and Cefepime  IV 2GM both with HD until 04/10/24.   Otitis media w/ TM perforation:  -s/p course of abx, ENT has seen   R sided weakness:  -main c/o on admission, CVA w/u was negative   Secondary hyperparathyroidism:  -phos at goal  Charmaine Piety, NP [facility] 03/02/2024,8:51 AM  LOS: 20 days    "

## 2024-03-02 NOTE — Progress Notes (Signed)
 OT Cancellation Note  Patient Details Name: Carl Sharrar Sr. MRN: [id number] DOB: 30-Sep-1960   Cancelled Treatment:    Reason Eval/Treat Not Completed: Patient at procedure or test/ unavailable. Pt in HD. Will attempt back as schedule allows for reeval.  Joshua Silvano Dragon 03/02/2024, 11:07 AM

## 2024-03-03 ENCOUNTER — Encounter (HOSPITAL_COMMUNITY): Payer: Self-pay | Admitting: Physical Medicine and Rehabilitation

## 2024-03-03 ENCOUNTER — Encounter (HOSPITAL_COMMUNITY): Payer: Self-pay | Admitting: Family Medicine

## 2024-03-03 ENCOUNTER — Other Ambulatory Visit: Payer: Self-pay

## 2024-03-03 ENCOUNTER — Inpatient Hospital Stay (HOSPITAL_COMMUNITY)
Admission: AD | Admit: 2024-03-03 | DRG: 945 | Source: Intra-hospital | Attending: Physical Medicine and Rehabilitation | Admitting: Physical Medicine and Rehabilitation

## 2024-03-03 DIAGNOSIS — Z992 Dependence on renal dialysis: Secondary | ICD-10-CM

## 2024-03-03 DIAGNOSIS — L89150 Pressure ulcer of sacral region, unstageable: Secondary | ICD-10-CM | POA: Diagnosis not present

## 2024-03-03 DIAGNOSIS — E440 Moderate protein-calorie malnutrition: Secondary | ICD-10-CM | POA: Insufficient documentation

## 2024-03-03 DIAGNOSIS — D638 Anemia in other chronic diseases classified elsewhere: Secondary | ICD-10-CM | POA: Diagnosis not present

## 2024-03-03 DIAGNOSIS — N186 End stage renal disease: Secondary | ICD-10-CM | POA: Diagnosis not present

## 2024-03-03 DIAGNOSIS — I330 Acute and subacute infective endocarditis: Secondary | ICD-10-CM

## 2024-03-03 DIAGNOSIS — R5381 Other malaise: Principal | ICD-10-CM | POA: Diagnosis present

## 2024-03-03 DIAGNOSIS — M009 Pyogenic arthritis, unspecified: Secondary | ICD-10-CM | POA: Diagnosis not present

## 2024-03-03 DIAGNOSIS — I739 Peripheral vascular disease, unspecified: Secondary | ICD-10-CM

## 2024-03-03 LAB — GLUCOSE, CAPILLARY
Glucose-Capillary: 83 mg/dL (ref 70–99)
Glucose-Capillary: 106 mg/dL — ABNORMAL HIGH (ref 70–99)
Glucose-Capillary: 96 mg/dL (ref 70–99)
Glucose-Capillary: 107 mg/dL — ABNORMAL HIGH (ref 70–99)

## 2024-03-03 LAB — RENAL FUNCTION PANEL
Albumin: 2.9 g/dL — ABNORMAL LOW (ref 3.5–5.0)
Anion gap: 13 (ref 5–15)
BUN: 38 mg/dL — ABNORMAL HIGH (ref 8–23)
CO2: 29 mmol/L (ref 22–32)
Calcium: 9.4 mg/dL (ref 8.9–10.3)
Chloride: 90 mmol/L — ABNORMAL LOW (ref 98–111)
Creatinine, Ser: 5.87 mg/dL — ABNORMAL HIGH (ref 0.61–1.24)
GFR, Estimated: 10 mL/min — ABNORMAL LOW
Glucose, Bld: 92 mg/dL (ref 70–99)
Phosphorus: 3 mg/dL (ref 2.5–4.6)
Potassium: 4.5 mmol/L (ref 3.5–5.1)
Sodium: 132 mmol/L — ABNORMAL LOW (ref 135–145)

## 2024-03-03 MED ORDER — PROCHLORPERAZINE EDISYLATE 10 MG/2ML IJ SOLN: 5.0000 mg | Freq: Four times a day (QID) | INTRAMUSCULAR | Status: AC | PRN

## 2024-03-03 MED ORDER — SORBITOL 70 % SOLN: 60.0000 mL | Freq: Every day | Status: AC | PRN

## 2024-03-03 MED ORDER — DOCUSATE SODIUM 100 MG PO CAPS: 100.0000 mg | ORAL_CAPSULE | Freq: Two times a day (BID) | ORAL | Status: AC

## 2024-03-03 MED ORDER — KATE FARMS STANDARD 1.4 PO LIQD
325.0000 mL | Freq: Every day | ORAL | Status: DC
  Administered 2024-03-04 – 2024-03-07 (×4): 325 mL via ORAL
  Filled 2024-03-03 (×8): qty 325

## 2024-03-03 MED ORDER — BISACODYL 10 MG RE SUPP: 10.0000 mg | Freq: Every day | RECTAL | Status: AC | PRN

## 2024-03-03 MED ORDER — OXYCODONE-ACETAMINOPHEN 5-325 MG PO TABS
1.0000 | ORAL_TABLET | ORAL | Status: AC | PRN
  Administered 2024-03-03 – 2024-03-20 (×34): 1 via ORAL
  Filled 2024-03-03 (×39): qty 1

## 2024-03-03 MED ORDER — ASPIRIN 81 MG PO TBEC
81.0000 mg | DELAYED_RELEASE_TABLET | Freq: Every day | ORAL | Status: AC
  Administered 2024-03-04 – 2024-03-20 (×17): 81 mg via ORAL
  Filled 2024-03-03 (×17): qty 1

## 2024-03-03 MED ORDER — PROSOURCE PLUS PO LIQD: 30.0000 mL | Freq: Two times a day (BID) | ORAL | Status: AC

## 2024-03-03 MED ORDER — HEPARIN SODIUM (PORCINE) 5000 UNIT/ML IJ SOLN
5000.0000 [IU] | Freq: Three times a day (TID) | INTRAMUSCULAR | Status: AC
  Administered 2024-03-03 – 2024-03-20 (×44): 5000 [IU] via SUBCUTANEOUS
  Filled 2024-03-03 (×48): qty 1

## 2024-03-03 MED ORDER — CHLORHEXIDINE GLUCONATE CLOTH 2 % EX PADS: 6.0000 | MEDICATED_PAD | Freq: Every day | CUTANEOUS | Status: DC

## 2024-03-03 MED ORDER — GABAPENTIN 300 MG PO CAPS
300.0000 mg | ORAL_CAPSULE | Freq: Every day | ORAL | Status: AC
  Administered 2024-03-03 – 2024-03-20 (×18): 300 mg via ORAL
  Filled 2024-03-03 (×19): qty 1

## 2024-03-03 MED ORDER — INSULIN ASPART 100 UNIT/ML IJ SOLN: 0.0000 [IU] | Freq: Three times a day (TID) | INTRAMUSCULAR | Status: AC

## 2024-03-03 MED ORDER — MIDODRINE HCL 5 MG PO TABS: 10.0000 mg | ORAL_TABLET | ORAL | Status: AC | PRN

## 2024-03-03 MED ORDER — MILK AND MOLASSES ENEMA: 1.0000 | Freq: Every day | RECTAL | Status: AC | PRN

## 2024-03-03 MED ORDER — FERROUS SULFATE 325 (65 FE) MG PO TABS: 325.0000 mg | ORAL_TABLET | Freq: Every day | ORAL | Status: AC

## 2024-03-03 MED ORDER — ACETAMINOPHEN 325 MG PO TABS: 650.0000 mg | ORAL_TABLET | Freq: Four times a day (QID) | ORAL | Status: AC | PRN

## 2024-03-03 MED ORDER — MIDODRINE HCL 5 MG PO TABS
10.0000 mg | ORAL_TABLET | Freq: Three times a day (TID) | ORAL | Status: AC
  Administered 2024-03-03 – 2024-03-20 (×50): 10 mg via ORAL
  Filled 2024-03-03 (×51): qty 2

## 2024-03-03 MED ORDER — ONDANSETRON HCL 4 MG PO TABS: 4.0000 mg | ORAL_TABLET | Freq: Four times a day (QID) | ORAL | Status: AC | PRN

## 2024-03-03 MED ORDER — FOLIC ACID 1 MG PO TABS: 1.0000 mg | ORAL_TABLET | Freq: Every day | ORAL | Status: AC

## 2024-03-03 MED ORDER — HYDROCERIN EX CREA
TOPICAL_CREAM | Freq: Two times a day (BID) | CUTANEOUS | Status: AC
  Administered 2024-03-14: 1 via TOPICAL
  Filled 2024-03-03: qty 113

## 2024-03-03 MED ORDER — SEVELAMER CARBONATE 800 MG PO TABS
2400.0000 mg | ORAL_TABLET | Freq: Three times a day (TID) | ORAL | Status: DC
  Administered 2024-03-03 – 2024-03-12 (×24): 2400 mg via ORAL
  Filled 2024-03-03 (×28): qty 3

## 2024-03-03 MED ORDER — CINACALCET HCL 30 MG PO TABS: 120.0000 mg | ORAL_TABLET | Freq: Every day | ORAL | Status: AC

## 2024-03-03 MED ORDER — PROCHLORPERAZINE MALEATE 5 MG PO TABS: 5.0000 mg | ORAL_TABLET | Freq: Four times a day (QID) | ORAL | Status: AC | PRN

## 2024-03-03 MED ORDER — MELATONIN 5 MG PO TABS: 5.0000 mg | ORAL_TABLET | Freq: Every evening | ORAL | Status: AC | PRN

## 2024-03-03 MED ORDER — DARBEPOETIN ALFA 100 MCG/0.5ML IJ SOSY: 100.0000 ug | PREFILLED_SYRINGE | INTRAMUSCULAR | Status: DC

## 2024-03-03 MED ORDER — POLYETHYLENE GLYCOL 3350 17 G PO PACK
17.0000 g | PACK | Freq: Every day | ORAL | Status: AC | PRN
  Administered 2024-03-05: 17 g via ORAL
  Filled 2024-03-03 (×2): qty 1

## 2024-03-03 MED ORDER — PANTOPRAZOLE SODIUM 40 MG PO TBEC
40.0000 mg | DELAYED_RELEASE_TABLET | Freq: Two times a day (BID) | ORAL | Status: AC
  Administered 2024-03-03 – 2024-03-20 (×33): 40 mg via ORAL
  Filled 2024-03-03 (×36): qty 1

## 2024-03-03 MED ORDER — PROCHLORPERAZINE 25 MG RE SUPP: 12.5000 mg | Freq: Four times a day (QID) | RECTAL | Status: AC | PRN

## 2024-03-03 MED ORDER — DARBEPOETIN ALFA 100 MCG/0.5ML IJ SOSY: 100.0000 ug | PREFILLED_SYRINGE | INTRAMUSCULAR | Status: AC

## 2024-03-03 MED ORDER — ASPIRIN 81 MG PO TBEC: 81.0000 mg | DELAYED_RELEASE_TABLET | Freq: Every day | ORAL | Status: AC

## 2024-03-03 MED ORDER — ATORVASTATIN CALCIUM 10 MG PO TABS
10.0000 mg | ORAL_TABLET | Freq: Every day | ORAL | Status: AC
  Administered 2024-03-04 – 2024-03-20 (×17): 10 mg via ORAL
  Filled 2024-03-03 (×17): qty 1

## 2024-03-03 MED ORDER — PROSOURCE PLUS PO LIQD
30.0000 mL | Freq: Two times a day (BID) | ORAL | Status: AC
  Administered 2024-03-04 – 2024-03-20 (×25): 30 mL via ORAL
  Filled 2024-03-03 (×25): qty 30

## 2024-03-03 MED ORDER — CAMPHOR-MENTHOL 0.5-0.5 % EX LOTN: TOPICAL_LOTION | CUTANEOUS | Status: AC | PRN

## 2024-03-03 MED ORDER — RENA-VITE PO TABS
1.0000 | ORAL_TABLET | Freq: Every day | ORAL | Status: AC
  Administered 2024-03-03 – 2024-03-20 (×18): 1 via ORAL
  Filled 2024-03-03 (×18): qty 1

## 2024-03-03 MED ORDER — CINACALCET HCL 30 MG PO TABS
120.0000 mg | ORAL_TABLET | Freq: Every day | ORAL | Status: AC
  Administered 2024-03-04 – 2024-03-20 (×17): 120 mg via ORAL
  Filled 2024-03-03 (×18): qty 4

## 2024-03-03 MED ORDER — GUAIFENESIN-DM 100-10 MG/5ML PO SYRP: 5.0000 mL | ORAL_SOLUTION | Freq: Four times a day (QID) | ORAL | Status: AC | PRN

## 2024-03-03 MED ORDER — SODIUM CHLORIDE 0.9 % IV SOLN
2.0000 g | INTRAVENOUS | Status: AC
  Administered 2024-03-04 – 2024-03-20 (×6): 2 g via INTRAVENOUS
  Filled 2024-03-03 (×12): qty 12.5

## 2024-03-03 MED ORDER — CYCLOBENZAPRINE HCL 5 MG PO TABS
10.0000 mg | ORAL_TABLET | Freq: Three times a day (TID) | ORAL | Status: AC | PRN
  Administered 2024-03-05 – 2024-03-06 (×2): 10 mg via ORAL
  Filled 2024-03-03 (×3): qty 2

## 2024-03-03 MED ORDER — MIDODRINE HCL 10 MG PO TABS: 10.0000 mg | ORAL_TABLET | Freq: Three times a day (TID) | ORAL | Status: AC

## 2024-03-03 MED ORDER — HYDROCERIN EX CREA: TOPICAL_CREAM | Freq: Two times a day (BID) | CUTANEOUS | Status: DC

## 2024-03-03 MED ORDER — INSULIN ASPART 100 UNIT/ML IJ SOLN
0.0000 [IU] | Freq: Three times a day (TID) | INTRAMUSCULAR | Status: DC
  Filled 2024-03-03: qty 6
  Filled 2024-03-03: qty 4

## 2024-03-03 MED ORDER — FOLIC ACID 1 MG PO TABS
1.0000 mg | ORAL_TABLET | Freq: Every day | ORAL | Status: AC
  Administered 2024-03-04 – 2024-03-20 (×17): 1 mg via ORAL
  Filled 2024-03-03 (×17): qty 1

## 2024-03-03 MED ORDER — PANTOPRAZOLE SODIUM 40 MG PO TBEC: 40.0000 mg | DELAYED_RELEASE_TABLET | Freq: Two times a day (BID) | ORAL | 1 refills | Status: AC

## 2024-03-03 MED ORDER — DIPHENHYDRAMINE HCL 25 MG PO CAPS
25.0000 mg | ORAL_CAPSULE | Freq: Four times a day (QID) | ORAL | Status: AC | PRN
  Administered 2024-03-16 – 2024-03-20 (×2): 25 mg via ORAL
  Filled 2024-03-03 (×2): qty 1

## 2024-03-03 MED ORDER — ACETAMINOPHEN 325 MG PO TABS
325.0000 mg | ORAL_TABLET | ORAL | Status: AC | PRN
  Administered 2024-03-05 – 2024-03-19 (×5): 650 mg via ORAL
  Filled 2024-03-03 (×5): qty 2

## 2024-03-03 MED ORDER — KATE FARMS STANDARD 1.4 PO LIQD: 325.0000 mL | Freq: Every day | ORAL | Status: AC

## 2024-03-03 MED ORDER — VANCOMYCIN HCL IN DEXTROSE 1-5 GM/200ML-% IV SOLN
1000.0000 mg | INTRAVENOUS | Status: AC
  Administered 2024-03-04 – 2024-03-20 (×7): 1000 mg via INTRAVENOUS
  Filled 2024-03-03 (×10): qty 200

## 2024-03-03 MED ORDER — PSYLLIUM 95 % PO PACK
1.0000 | PACK | Freq: Every day | ORAL | Status: AC
  Administered 2024-03-04 – 2024-03-20 (×17): 1 via ORAL
  Filled 2024-03-03 (×19): qty 1

## 2024-03-03 MED ORDER — FERROUS SULFATE 325 (65 FE) MG PO TABS
325.0000 mg | ORAL_TABLET | Freq: Every day | ORAL | Status: AC
  Administered 2024-03-04 – 2024-03-20 (×17): 325 mg via ORAL
  Filled 2024-03-03 (×17): qty 1

## 2024-03-03 MED ORDER — CLOPIDOGREL BISULFATE 75 MG PO TABS
75.0000 mg | ORAL_TABLET | Freq: Every day | ORAL | Status: AC
  Administered 2024-03-04 – 2024-03-20 (×17): 75 mg via ORAL
  Filled 2024-03-03 (×17): qty 1

## 2024-03-03 MED ORDER — LIDOCAINE 5 % EX PTCH
1.0000 | MEDICATED_PATCH | CUTANEOUS | Status: AC
  Administered 2024-03-03 – 2024-03-20 (×15): 1 via TRANSDERMAL
  Filled 2024-03-03 (×18): qty 1

## 2024-03-03 MED ORDER — OXYCODONE-ACETAMINOPHEN 5-325 MG PO TABS: 1.0000 | ORAL_TABLET | ORAL | Status: AC | PRN

## 2024-03-03 MED ORDER — DOCUSATE SODIUM 100 MG PO CAPS
100.0000 mg | ORAL_CAPSULE | Freq: Two times a day (BID) | ORAL | Status: AC
  Administered 2024-03-03 – 2024-03-20 (×35): 100 mg via ORAL
  Filled 2024-03-03 (×36): qty 1

## 2024-03-03 MED ORDER — COLLAGENASE 250 UNIT/GM EX OINT
TOPICAL_OINTMENT | Freq: Every day | CUTANEOUS | Status: AC
  Administered 2024-03-14: 1 via TOPICAL
  Filled 2024-03-03: qty 30

## 2024-03-03 NOTE — Plan of Care (Signed)
" °  Problem: Education: Goal: Knowledge of General Education information will improve Description: Including pain rating scale, medication(s)/side effects and non-pharmacologic comfort measures Outcome: Progressing   Problem: Health Behavior/Discharge Planning: Goal: Ability to manage health-related needs will improve Outcome: Progressing   Problem: Clinical Measurements: Goal: Ability to maintain clinical measurements within normal limits will improve Outcome: Progressing Goal: Will remain free from infection Outcome: Progressing Goal: Diagnostic test results will improve Outcome: Progressing Goal: Respiratory complications will improve Outcome: Progressing Goal: Cardiovascular complication will be avoided Outcome: Progressing   Problem: Activity: Goal: Risk for activity intolerance will decrease Outcome: Progressing   Problem: Nutrition: Goal: Adequate nutrition will be maintained Outcome: Progressing   Problem: Coping: Goal: Level of anxiety will decrease Outcome: Progressing   Problem: Elimination: Goal: Will not experience complications related to bowel motility Outcome: Progressing Goal: Will not experience complications related to urinary retention Outcome: Progressing   Problem: Pain Managment: Goal: General experience of comfort will improve and/or be controlled Outcome: Progressing   Problem: Safety: Goal: Ability to remain free from injury will improve Outcome: Progressing   Problem: Skin Integrity: Goal: Risk for impaired skin integrity will decrease Outcome: Progressing   Problem: Education: Goal: Ability to describe self-care measures that may prevent or decrease complications (Diabetes Survival Skills Education) will improve Outcome: Progressing Goal: Individualized Educational Video(s) Outcome: Progressing   Problem: Coping: Goal: Ability to adjust to condition or change in health will improve Outcome: Progressing   Problem: Fluid  Volume: Goal: Ability to maintain a balanced intake and output will improve Outcome: Progressing   Problem: Health Behavior/Discharge Planning: Goal: Ability to identify and utilize available resources and services will improve Outcome: Progressing Goal: Ability to manage health-related needs will improve Outcome: Progressing   Problem: Metabolic: Goal: Ability to maintain appropriate glucose levels will improve Outcome: Progressing   Problem: Nutritional: Goal: Maintenance of adequate nutrition will improve Outcome: Progressing Goal: Progress toward achieving an optimal weight will improve Outcome: Progressing   Problem: Skin Integrity: Goal: Risk for impaired skin integrity will decrease Outcome: Progressing   Problem: Tissue Perfusion: Goal: Adequacy of tissue perfusion will improve Outcome: Progressing   Problem: Education: Goal: Knowledge of the prescribed therapeutic regimen will improve Outcome: Progressing   Problem: Bowel/Gastric: Goal: Gastrointestinal status for postoperative course will improve Outcome: Progressing   Problem: Cardiac: Goal: Ability to maintain an adequate cardiac output Outcome: Progressing Goal: Will show no evidence of cardiac arrhythmias Outcome: Progressing   Problem: Nutritional: Goal: Will attain and maintain optimal nutritional status Outcome: Progressing   Problem: Neurological: Goal: Will regain or maintain usual level of consciousness Outcome: Progressing   Problem: Clinical Measurements: Goal: Ability to maintain clinical measurements within normal limits Outcome: Progressing Goal: Postoperative complications will be avoided or minimized Outcome: Progressing   Problem: Respiratory: Goal: Will regain and/or maintain adequate ventilation Outcome: Progressing Goal: Respiratory status will improve Outcome: Progressing   Problem: Skin Integrity: Goal: Demonstrates signs of wound healing without infection Outcome:  Progressing   "

## 2024-03-03 NOTE — Plan of Care (Signed)
" °  Problem: Education: Goal: Knowledge of General Education information will improve Description: Including pain rating scale, medication(s)/side effects and non-pharmacologic comfort measures Outcome: Progressing   Problem: Health Behavior/Discharge Planning: Goal: Ability to manage health-related needs will improve Outcome: Progressing   Problem: Clinical Measurements: Goal: Ability to maintain clinical measurements within normal limits will improve Outcome: Progressing Goal: Will remain free from infection Outcome: Progressing Goal: Diagnostic test results will improve Outcome: Progressing Goal: Respiratory complications will improve Outcome: Progressing Goal: Cardiovascular complication will be avoided Outcome: Progressing   Problem: Activity: Goal: Risk for activity intolerance will decrease Outcome: Progressing   Problem: Nutrition: Goal: Adequate nutrition will be maintained Outcome: Progressing   Problem: Pain Managment: Goal: General experience of comfort will improve and/or be controlled Outcome: Progressing   Problem: Safety: Goal: Ability to remain free from injury will improve Outcome: Progressing   Problem: Fluid Volume: Goal: Ability to maintain a balanced intake and output will improve Outcome: Progressing   Problem: Skin Integrity: Goal: Risk for impaired skin integrity will decrease Outcome: Progressing   Problem: Cardiac: Goal: Will show no evidence of cardiac arrhythmias Outcome: Progressing   "

## 2024-03-03 NOTE — Progress Notes (Signed)
 "    Carl Bouchard, MD  Physician Physical Medicine and Rehabilitation   Consult Note    Signed   Date of Service: 02/11/2024  3:52 PM  Related encounter: ED to Hosp-Admission (Current) from 02/08/2024 in [facility]   Signed     Expand All Collapse All  Show:Clear all [x] Written[x] Templated[] Copied  Added by: [x] Lovorn, Megan, MD  [] Hover for details          Physical Medicine and Rehabilitation Consult Reason for Consult:CIR? Referring Physician: Dr Raj     HPI: Carl Uhlig Sr. is a 64 y.o. male  Jehovah's witness with ESRD, PAD with critical RLE ischemia s/p stent; and C3/5 fusion for cervical myelopathy in 4/25- pt admitted with R sided weakness and multiple falls- pt felt was fast, but occurred over 3-5 days that got weaker and weaker- was driving and doing his own self care and walking with cane 2 weeks prior to admission.    R reports things got worse after most recent fall 12/23- when knee on R gave out on him.  Also he had seen Dr [name] who suggested R shoulder revision- but pt wanted to wait to see if knee would get better.   Said neck is really hurting- and feels he needs something done. I explained that pain is not a reason to get surgery on neck but it does indicate the known cervical myelopathy he has.    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            ROS an entire ROS was completed and negative except for HPI     Past Medical History:  Diagnosis Date   Acute gastric ulcer with hemorrhage     Acute GI bleeding 07/31/2019   Acute pericarditis     Anemia      hx of   Anxiety      situational    Arthritis      on meds   Borderline diabetes     Cataract      bilateral sx   Chronic headaches     Depression      situational    Diverticulitis     ESRD (end stage renal disease) (HCC)       On Renal Transplant List, [facility]; MWF (10/23/2016)   GERD  (gastroesophageal reflux disease)      with certain foods   GI bleed     Hypertension      diet controlled   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     Parathyroid  abnormality      ectopic parathyroid  gland   Presence of arteriovenous fistula for hemodialysis, primary      RUE PER PT RLE   Refusal of blood product      NO WHOLE BLOOD PROUCTS   Renal cell carcinoma (HCC)      s/p hand assisted laparoscopic bilateral nephrectomies 11/29/17, + RCC left   Secondary hyperparathyroidism     Seizures (HCC)      one episode in past, due to elevated Potassium 08/02/20- at least 4 years ago   Sleep apnea      doesn't use CPAP anymore since weight loss   Stroke [facility])      no residual             Past Surgical History:  Procedure Laterality Date   ABDOMINAL AORTOGRAM W/LOWER EXTREMITY N/A 12/09/2023    Procedure: ABDOMINAL  AORTOGRAM W/LOWER EXTREMITY;  Surgeon: Sheree Penne Bruckner, MD;  Location: [facility] INVASIVE CV LAB;  Service: Cardiovascular;  Laterality: N/A;   ANTERIOR CERVICAL CORPECTOMY N/A 05/25/2023    Procedure: ANTERIOR CERVICAL CORPECTOMY CERVICAL FOUR, CERVICAL THREE - CERVICAL FIVE FUSION;  Surgeon: [name] Shove, MD;  Location: [facility] OR;  Service: Neurosurgery;  Laterality: N/A;   AV FISTULA PLACEMENT Right      right arm   BIOPSY   08/01/2019    Procedure: BIOPSY;  Surgeon: Kristie Lamprey, MD;  Location: [facility] ENDOSCOPY;  Service: Endoscopy;;   BIOPSY   11/18/2020    Procedure: BIOPSY;  Surgeon: Stacia Glendia BRAVO, MD;  Location: [facility] ENDOSCOPY;  Service: Gastroenterology;;   BIOPSY   09/13/2021    Procedure: BIOPSY;  Surgeon: Wilhelmenia Aloha Raddle., MD;  Location: [facility] ENDOSCOPY;  Service: Gastroenterology;;   CATARACT EXTRACTION W/ INTRAOCULAR LENS  IMPLANT, BILATERAL       COLON SURGERY       COLONOSCOPY N/A 08/04/2015    Procedure: COLONOSCOPY;  Surgeon: Gordy CHRISTELLA Starch, MD;  Location: [facility] ENDOSCOPY;  Service: Endoscopy;  Laterality: N/A;   COLONOSCOPY   2017    JMP@ [facility]-good  prep-mass -recall 1 yr   COLONOSCOPY N/A 09/13/2021    Procedure: COLONOSCOPY;  Surgeon: Mansouraty, Aloha Raddle., MD;  Location: [facility] ENDOSCOPY;  Service: Gastroenterology;  Laterality: N/A;   COLONOSCOPY WITH PROPOFOL  N/A 11/25/2020    Procedure: COLONOSCOPY WITH PROPOFOL ;  Surgeon: Federico Rosario BROCKS, MD;  Location: [facility] ENDOSCOPY;  Service: Gastroenterology;  Laterality: N/A;   COLONOSCOPY WITH PROPOFOL  N/A 09/23/2021    Procedure: COLONOSCOPY WITH PROPOFOL ;  Surgeon: San Sandor GAILS, DO;  Location: [facility] ENDOSCOPY;  Service: Gastroenterology;  Laterality: N/A;   ENTEROSCOPY N/A 09/23/2021    Procedure: ENTEROSCOPY;  Surgeon: San Sandor GAILS, DO;  Location: [facility] ENDOSCOPY;  Service: Gastroenterology;  Laterality: N/A;  Will place order for video capsule study as we may opt to place it during procedure   ESOPHAGOGASTRODUODENOSCOPY N/A 08/01/2019    Procedure: ESOPHAGOGASTRODUODENOSCOPY (EGD);  Surgeon: Kristie Lamprey, MD;  Location: [facility] ENDOSCOPY;  Service: Endoscopy;  Laterality: N/A;   ESOPHAGOGASTRODUODENOSCOPY N/A 11/18/2020    Procedure: ESOPHAGOGASTRODUODENOSCOPY (EGD);  Surgeon: Stacia Glendia BRAVO, MD;  Location: [facility] ENDOSCOPY;  Service: Gastroenterology;  Laterality: N/A;   ESOPHAGOGASTRODUODENOSCOPY (EGD) WITH PROPOFOL  N/A 08/04/2019    Procedure: ESOPHAGOGASTRODUODENOSCOPY (EGD) WITH PROPOFOL ;  Surgeon: Leigh Elspeth SQUIBB, MD;  Location: [facility] ENDOSCOPY;  Service: Gastroenterology;  Laterality: N/A;   ESOPHAGOGASTRODUODENOSCOPY (EGD) WITH PROPOFOL  N/A 09/13/2021    Procedure: ESOPHAGOGASTRODUODENOSCOPY (EGD) WITH PROPOFOL ;  Surgeon: Wilhelmenia Aloha Raddle., MD;  Location: [facility] ENDOSCOPY;  Service: Gastroenterology;  Laterality: N/A;   GIVENS CAPSULE STUDY N/A 09/23/2021    Procedure: GIVENS CAPSULE STUDY;  Surgeon: San Sandor GAILS, DO;  Location: [facility] ENDOSCOPY;  Service: Gastroenterology;  Laterality: N/A;   graft left arm Left      for dialysis x 2. Removed   HOT HEMOSTASIS   11/18/2020    Procedure:  HOT HEMOSTASIS (ARGON PLASMA COAGULATION/BICAP);  Surgeon: Stacia Glendia BRAVO, MD;  Location: [facility] ENDOSCOPY;  Service: Gastroenterology;;   HOT HEMOSTASIS N/A 09/23/2021    Procedure: HOT HEMOSTASIS (ARGON PLASMA COAGULATION/BICAP);  Surgeon: San Sandor GAILS, DO;  Location: [facility] ENDOSCOPY;  Service: Gastroenterology;  Laterality: N/A;   INSERTION OF DIALYSIS CATHETER        Rt chest   LAPAROSCOPIC RIGHT COLECTOMY N/A 08/05/2015    Procedure: LAPAROSCOPIC RIGHT COLECTOMY- ASCENDING;  Surgeon: Jina Nephew, MD;  Location: [facility] OR;  Service:  General;  Laterality: N/A;   LOWER EXTREMITY ANGIOGRAPHY N/A 12/09/2023    Procedure: Lower Extremity Angiography;  Surgeon: Sheree Penne Bruckner, MD;  Location: [facility] INVASIVE CV LAB;  Service: Cardiovascular;  Laterality: N/A;   LOWER EXTREMITY INTERVENTION N/A 12/09/2023    Procedure: LOWER EXTREMITY INTERVENTION;  Surgeon: Sheree Penne Bruckner, MD;  Location: [facility] INVASIVE CV LAB;  Service: Cardiovascular;  Laterality: N/A;   MASS EXCISION Left 05/28/2019    Procedure: EXCISION SOFT TISSUE MASS LEFT SHOULDER;  Surgeon: Eletha Boas, MD;  Location: [facility] ORS;  Service: General;  Laterality: Left;   NEPHRECTOMY Bilateral     PARATHYROIDECTOMY N/A 06/12/2016    Procedure: TOTAL PARATHYROIDECTOMY WITH AUTOTRANSPLANTATION TO LEFT FOREARM;  Surgeon: Boas Eletha, MD;  Location: [facility] OR;  Service: General;  Laterality: N/A;   PARATHYROIDECTOMY N/A 10/23/2016    Procedure: PARATHYROIDECTOMY;  Surgeon: Eletha Boas, MD;  Location: [facility] OR;  Service: General;  Laterality: N/A;   REVERSE SHOULDER ARTHROPLASTY Right 08/24/2020    Procedure: REVERSE SHOULDER ARTHROPLASTY;  Surgeon: Cristy Bonner DASEN, MD;  Location[facility] OR;  Service: Orthopedics;  Laterality: Right;   REVISON OF ARTERIOVENOUS FISTULA Right 07/16/2017    Procedure: REVISION OF ARTERIOVENOUS FISTULA  Right ARM;  Surgeon: Sheree Penne Bruckner, MD;  Location: [facility] OR;  Service: Vascular;  Laterality: Right;   TOTAL  HIP ARTHROPLASTY Left 11/14/2020    Procedure: LEFT TOTAL HIP ARTHROPLASTY ANTERIOR APPROACH;  Surgeon: [name] Kay HERO, MD;  Location: [facility] OR;  Service: Orthopedics;  Laterality: Left;   UPPER GASTROINTESTINAL ENDOSCOPY   2021    @ [facility]             Family History  Problem Relation Age of Onset   Diabetes Father     Stroke Father     Hypertension Father     Uterine cancer Mother     Lupus Sister     Stroke Sister     Hypertension Sister     Anuerysm Brother          brain   Colon cancer Neg Hx     Esophageal cancer Neg Hx     Stomach cancer Neg Hx     Pancreatic cancer Neg Hx     Liver disease Neg Hx     Colon polyps Neg Hx     Rectal cancer Neg Hx          Social History:  reports that he quit smoking about 33 years ago. His smoking use included cigarettes. He has never used smokeless tobacco. He reports that he does not currently use alcohol after a past usage of about 1.0 standard drink of alcohol per week. He reports that he does not currently use drugs after having used the following drugs: Marijuana. Allergies: [Allergies]  [Allergies]      Allergen Reactions   Infed [Iron Dextran] Other (See Comments)      Decreased BP    Vicodin [Hydrocodone -Acetaminophen ] Other (See Comments)      Decreased BP   Hydrocodone  Other (See Comments)      Unable to recall reaction   Voltaren [Diclofenac] Other (See Comments)      Hypotension          Medications Prior to Admission  Medication Sig Dispense Refill   acetaminophen  (TYLENOL ) 650 MG CR tablet Take 1,300 mg by mouth 2 (two) times daily.       amoxicillin  (AMOXIL ) 500 MG capsule TAKE FOUR CAPSULES BY MOUTH ONE HOUR BEFORE DENTAL WORK/PROCEDURE 8  capsule 0   Aspirin -Caffeine  (BAYER BACK & BODY PAIN EX ST PO) Take 2 tablets by mouth 2 (two) times daily as needed (pain).       augmented betamethasone  dipropionate (DIPROLENE -AF) 0.05 % ointment Apply 1 Application topically 2 (two) times daily as needed for rash.        CALCITRIOL  PO Take 2 mcg by mouth See admin instructions. Medication provided and administered by [facility] dialysis clinic. Give 2mcg by mouth 3 times per week week with every dialysis treatment.       clopidogrel  (PLAVIX ) 75 MG tablet Take 1 tablet (75 mg total) by mouth daily. 30 tablet 11   diphenhydrAMINE  (BENADRYL ) 25 mg capsule Take 1 capsule (25 mg total) by mouth every 6 (six) hours as needed for itching.       gabapentin  (NEURONTIN ) 300 MG capsule Take 300 mg by mouth daily.       heparin  sodium, porcine, 1000 UNIT/ML injection Inject 3,000 Units into the vein See admin instructions. Medication provided and administered by [facility] dialysis clinic. Bolus; IV push 3000u with every dialysis treatment 3 times per week (total treatment 240 minutes).       ibuprofen (ADVIL) 800 MG tablet Take 800 mg by mouth 2 (two) times daily as needed (pain).       lidocaine  (LIDODERM ) 5 % Place 1 patch onto the skin daily. Remove & Discard patch within 12 hours 30 patch 0   LOKELMA  10 g PACK packet Take 1 packet by mouth daily.       midodrine  (PROAMATINE ) 10 MG tablet Take 1 tablet (10 mg total) by mouth every dialysis (30 min before each dialysis.). 15 tablet 5   Misc Natural Products (TURMERIC, CURCUMIN, PO) Take 1 tablet by mouth daily.       multivitamin (RENA-VIT) TABS tablet Take 1 tablet by mouth daily.       ofloxacin  (FLOXIN ) 0.3 % OTIC solution Place 10 drops into the left ear daily for 7 days. 10 mL 0   Psyllium (METAMUCIL PO) Take 1 Scoop by mouth daily as needed (GI issues).       sevelamer  carbonate (RENVELA ) 800 MG tablet Take 3,200 mg by mouth 3 (three) times daily with meals.       amoxicillin -clavulanate (AUGMENTIN ) 875-125 MG tablet Take 1 tablet by mouth 2 (two) times daily. 10 day course. (Patient not taking: Reported on 02/11/2024)       atorvastatin  (LIPITOR) 10 MG tablet Take 1 tablet (10 mg total) by mouth every Thursday. (Patient not taking: Reported on 02/11/2024) 30 tablet 2    cyclobenzaprine  (FLEXERIL ) 10 MG tablet Take 1 tablet (10 mg total) by mouth 3 (three) times daily as needed for muscle spasms. (Patient not taking: Reported on 02/11/2024) 30 tablet 0   gabapentin  (NEURONTIN ) 100 MG capsule Take 3 capsules (300 mg total) by mouth at bedtime. (Patient not taking: Reported on 02/11/2024) 30 capsule 2          Home: Home Living Family/patient expects to be discharged to:: Private residence Living Arrangements: Children Available Help at Discharge: Family, Available 24 hours/day Type of Home: Apartment Home Access: Level entry Home Layout: One level Bathroom Shower/Tub: Engineer, Manufacturing Systems: Standard (+ toilet riser) Bathroom Accessibility: Yes Home Equipment: Agricultural Consultant (2 wheels), Grab bars - tub/shower, Government social research officer, Wheelchair - manual  Lives With: Son  Functional History: Prior Function Prior Level of Function : Independent/Modified Independent Mobility Comments: Mod I using RW; x3 falls in past month due  to R knee buckling ADLs Comments: Mod I ADLs, was driving but difficulty driving lately due to R leg not functioning properly Functional Status:  Mobility: Bed Mobility Overal bed mobility: Needs Assistance Bed Mobility: Supine to Sit Supine to sit: Mod assist, HOB elevated Sit to supine: Mod assist, HOB elevated General bed mobility comments: mod A for RLE off EOB and truncal elevation Transfers Overall transfer level: Needs assistance Equipment used: Rolling walker (2 wheels) Transfers: Sit to/from Stand, Bed to chair/wheelchair/BSC Sit to Stand: Mod assist, +2 physical assistance, +2 safety/equipment, From elevated surface Bed to/from chair/wheelchair/BSC transfer type:: Step pivot Step pivot transfers: Mod assist, +2 physical assistance, +2 safety/equipment, From elevated surface General transfer comment: R knee blocked with STS and steps. Poor progression of LLE despite R knee block needing dense cues for taking larger  amplitude steps Ambulation/Gait Ambulation/Gait assistance: Max assist Gait Distance (Feet): 2 Feet Assistive device: Rolling walker (2 wheels) Gait Pattern/deviations: Step-to pattern, Decreased step length - right, Decreased step length - left, Decreased stride length, Knees buckling, Trunk flexed General Gait Details: unable to progress Gait velocity: reduced Gait velocity interpretation: <1.31 ft/sec, indicative of household ambulator Pre-gait activities: Pt took small steps from EOB to recliner with R knee blocked throughout; slight improved with incerased time up but continued to require knee block Mod A +2 for wgt shifting, stepping and balance/LE strength.   ADL: ADL Overall ADL's : Needs assistance/impaired Eating/Feeding: Set up, Bed level Grooming: Contact guard assist, Sitting Upper Body Bathing: Sitting, Minimal assistance Lower Body Bathing: Maximal assistance, Sit to/from stand, +2 for safety/equipment, +2 for physical assistance Upper Body Dressing : Minimal assistance, Sitting Lower Body Dressing: Maximal assistance, +2 for physical assistance, +2 for safety/equipment Toilet Transfer: Moderate assistance, +2 for physical assistance, +2 for safety/equipment, Stand-pivot, Rolling walker (2 wheels), BSC/3in1 Functional mobility during ADLs: +2 for safety/equipment, +2 for physical assistance, Moderate assistance   Cognition: Cognition Overall Cognitive Status: History of cognitive impairments - at baseline Arousal/Alertness: Awake/alert Orientation Level: Oriented X4 Attention: Sustained Sustained Attention: Impaired Sustained Attention Impairment: Verbal basic Memory: Impaired Memory Impairment: Decreased short term memory, Decreased recall of new information Decreased Short Term Memory: Verbal basic Awareness: Appears intact Problem Solving: Appears intact Safety/Judgment: Appears intact Cognition Arousal: Alert Behavior During Therapy: WFL for tasks  assessed/performed Overall Cognitive Status: History of cognitive impairments - at baseline   Blood pressure (!) 117/53, pulse 86, temperature 98.9 F (37.2 C), resp. rate 18, height 6' 1 (1.854 m), weight 91 kg, SpO2 95%. Physical Exam Vitals and nursing note reviewed.  Constitutional:      Appearance: Normal appearance. He is normal weight.     Comments: Pt sitting up in bedside chair Neck and head tilted a lot to Left, NAD-    HENT:     Head: Normocephalic and atraumatic.     Right Ear: External ear normal.     Left Ear: External ear normal.     Nose: Nose normal.     Mouth/Throat:     Mouth: Mucous membranes are dry.  Eyes:     General:        Right eye: No discharge.        Left eye: No discharge.     Extraocular Movements: Extraocular movements intact.  Neck:     Comments: TTP around neck- paraspinals and scalenes/upper traps and levators B/L Cardiovascular:     Rate and Rhythm: Normal rate and regular rhythm.     Heart sounds: Normal heart sounds.  No murmur heard.    No gallop.     Comments: R distal arm fistula- has thrill Pulmonary:     Effort: Pulmonary effort is normal. No respiratory distress.     Breath sounds: Normal breath sounds. No wheezing, rhonchi or rales.  Abdominal:     General: Bowel sounds are normal. There is no distension.     Palpations: Abdomen is soft.     Tenderness: There is no abdominal tenderness.  Musculoskeletal:     Comments: RUE-  Biceps 4/5; Triceps 4/5; WE 4+/5; Grip 2+/5 and FA 2/5 LUE- Biceps/triceps 4+/5; WE 4/5; Grip 4/5 and FA 4-/5 RLE- HF 2/5; at best; KE 2/5- pain limited and KF 4-/5; DF/PF 4/5 LLE- HF 4/5; KE/KF 4/5; DF/PF 4+/5 Compared to 10/1 appt- more weak distally in RUE and proximally in RLE  Skin:    General: Skin is warm and dry.     Comments:  Some bruising on UEs  Neurological:     Comments: No clonus, but has hoffman's RUE- mild- not in LUE that I could see Hard to assess DTRs due to pain  Psychiatric:         Mood and Affect: Mood normal.        Behavior: Behavior normal.       Lab Results Last 24 Hours       Results for orders placed or performed during the hospital encounter of 02/08/24 (from the past 24 hours)  Renal function panel     Status: Abnormal    Collection Time: 02/11/24  8:53 AM  Result Value Ref Range    Sodium 134 (L) 135 - 145 mmol/L    Potassium 4.9 3.5 - 5.1 mmol/L    Chloride 90 (L) 98 - 111 mmol/L    CO2 28 22 - 32 mmol/L    Glucose, Bld 84 70 - 99 mg/dL    BUN 47 (H) 8 - 23 mg/dL    Creatinine, Ser 2.13 (H) 0.61 - 1.24 mg/dL    Calcium  10.1 8.9 - 10.3 mg/dL    Phosphorus 5.0 (H) 2.5 - 4.6 mg/dL    Albumin  3.1 (L) 3.5 - 5.0 g/dL    GFR, Estimated 7 (L) >60 mL/min    Anion gap 16 (H) 5 - 15      Imaging Results (Last 48 hours)  CT THORACIC SPINE WO CONTRAST Result Date: 02/11/2024 EXAM: CT THORACIC SPINE WITHOUT CONTRAST 02/10/2024 05:21:58 PM TECHNIQUE: CT of the thoracic spine was performed without the administration of intravenous contrast. Multiplanar reformatted images are provided for review. Automated exposure control, iterative reconstruction, and/or weight based adjustment of the mA/kV was utilized to reduce the radiation dose to as low as reasonably achievable. COMPARISON: MRI of the thoracic spine dated 02/09/2024. CLINICAL HISTORY: Compression fracture, thoracic. FINDINGS: BONES AND ALIGNMENT: Normal vertebral body heights. No acute fracture or suspicious bone lesion. There is moderate dextroscoliosis and moderate kyphosis. Erosions are present within the superior endplates of T11 and T12 anteriorly, likely secondary to degenerative disc disease. The vertebral bodies are diffusely sclerotic, presumably representing renal osteodystrophy. DEGENERATIVE CHANGES: Erosions are present within the superior endplates of T11 and T12 anteriorly, likely secondary to degenerative disc disease. SOFT TISSUES: No acute abnormality. LUNGS: There is mild atelectasis present  independently within the lungs bilaterally. IMPRESSION: 1. No acute fracture. 2. Moderate dextroscoliosis and moderate kyphosis. 3. Erosions within the superior endplates of T11 and T12 anteriorly, likely secondary to degenerative disc disease. 4. Diffuse sclerosis of the vertebral  bodies, presumably representing renal osteodystrophy. 5. Mild bilateral pulmonary atelectasis. Electronically signed by: Evalene Coho MD 02/11/2024 04:57 AM EST RP Workstation: [id number]    DG Knee 1-2 Views Right Result Date: 02/10/2024 CLINICAL DATA:  Pain. EXAM: RIGHT KNEE - 1-2 VIEW COMPARISON:  06/11/2023 FINDINGS: The bones are subjectively under mineralized. Medial and lateral tibiofemoral joint space narrowing. There is tricompartmental peripheral spurring. Moderate joint effusion. Quadriceps and patellar tendon enthesophytes. No erosive or bony destructive change. Vascular calcifications with vascular stent in place. IMPRESSION: Tricompartmental osteoarthritis with moderate joint effusion. Electronically Signed   By: Andrea Gasman M.D.   On: 02/10/2024 14:45    MR THORACIC SPINE WO CONTRAST Result Date: 02/09/2024 EXAM: MRI Thoracic Spine Without Intravenous Contrast 02/09/2024 06:28:29 PM TECHNIQUE: Multiplanar multisequence MRI of the thoracic spine was performed without the administration of intravenous contrast. COMPARISON: None available. CLINICAL HISTORY: Mid-back pain FINDINGS: BONES AND ALIGNMENT: Dectrocurvature. Linear edema in the left eccentric T11 and T12 superior endplates could represent degenerative change or acute/subacute fractures without significant height loss. Diffusely low signal within the bone marrow, likely related to known chronic kidney disease. No specific evidence of discitis/osteomyelitis. Degenerative/discogenic changes can play a role and signal changes at multiple levels. SPINAL CORD: Normal spinal cord volume. Normal spinal cord signal. SOFT TISSUES: Unremarkable. DEGENERATIVE  CHANGES: Multilevel facet arthropathy and dextrocurvature contributes to foraminal stenosis. This includes moderate right foraminal stenosis at T2 T3 , T8-T9, and T9-T10. Severe left foraminal stenosis at T11-T12. Significant canal stenosis . IMPRESSION: 1. Linear edema in the left eccentric T11 and T12 superior endplates could represent degenerative change or acute/subacute fractures without significant height loss. Correlate with the presence or absence of point tenderness. 2. Severe left foraminal stenosis at T11-T12. 3. Moderate right foraminal stenosis at T2 T3 , T8-T9, and T9-T 4. S-shaped thoracolumbar curvature. 5. No significant canal stenosis. Electronically signed by: Gilmore Molt 02/09/2024 08:20 PM EST RP Workstation: [id number]    MR LUMBAR SPINE WO CONTRAST Result Date: 02/09/2024 EXAM: MRI LUMBAR SPINE 02/09/2024 06:28:15 PM TECHNIQUE: Multiplanar multisequence MRI of the lumbar spine was performed without the administration of intravenous contrast. COMPARISON: None available. CLINICAL HISTORY: Low back pain, prior surgery, new symptoms FINDINGS: BONES AND ALIGNMENT: Grade 1 anterolisthesis of L3 on L4. Rotatory levocurvature. Heterogeneous bone marrow which is low in attenuation on T1, likely related to patient's known chronic renal disease. Mild right lateral L3-L4 disc edema and surrounding marrow edema. Normal vertebral body heights. SPINAL CORD: The conus terminates normally. SOFT TISSUES: No paraspinal mass. L1-L2: No significant disc herniation. No spinal canal stenosis or neural foraminal narrowing. L2-L3: Mild disc bulge, right greater than left facet arthropathy. No significant canal or foraminal stenosis. L3-L4: Grade 1 anterolisthesis. Right lateral disc edema and degenerative disc disease with mild associated endplate signal changes. Disc bulging with bilateral facet arthropathy. Severe right foraminal stenosis. Moderate right subarticular recess stenosis. Mild canal stenosis.  L4-L5: Disc bulge, right greater than left facet arthropathy. Resulting moderate right foraminal stenosis. Patent canal and left foramen. L5-S1: Disc bulge and endplate spurring. Facet arthropathy resulting in mild left foraminal stenosis. IMPRESSION: 1. At L3-L4, moderate to severe right and mild left foraminal stenosis. Moderate right subarticular recess stenosis and mild canal stenosis. 2. At L4-L5, moderate right foraminal stenosis. 3. Right lateral L3-L4 disc edema and mild surrounding marrow edema which is most likely degenerative in the setting of rotatory levocurvature and severe degenerative changes at this level. Recommend correlation with infectious markers to exclude early discitis, which  is thought less likely. Electronically signed by: Gilmore Molt 02/09/2024 08:01 PM EST RP Workstation: [id number]    MR SHOULDER RIGHT WO CONTRAST Result Date: 02/09/2024 CLINICAL DATA:  Pain after fall. 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 EXAM: MRI OF THE RIGHT SHOULDER WITHOUT CONTRAST TECHNIQUE: Multiplanar, multisequence MR imaging of the shoulder was performed. No intravenous contrast was administered. COMPARISON:  Right shoulder radiographs dated 02/07/2024. FINDINGS: Bones/Joint Status post right reverse shoulder arthroplasty with associated susceptibility artifact which significantly limits evaluation of the surrounding bone and soft tissues. No discrete fracture identified on this limited examination. Soft tissue and Muscles There is a well-circumscribed T2 intermediate to hyperintense and T1 hypointense fluid collection within the posterior subacromial/subdeltoid space measuring approximately 5.1 x 2.9 x 1.9 cm (series 6, image 5 and series 9, image 15), favored to represent a postoperative collection such as a seroma. Severe fatty atrophy of the subscapularis, supraspinatus, and infraspinatus muscles. IMPRESSION: 1. Status post right reverse shoulder arthroplasty with  associated susceptibility artifact which significantly limits evaluation of the surrounding bone and soft tissues. No discrete fracture identified on this limited examination. CT could be considered for further evaluation of the reverse shoulder arthroplasty and if there is persistent concern for acute fracture. 2. A well-circumscribed fluid collection within the posterior subacromial/subdeltoid space measuring approximately 5.1 x 2.9 x 1.9 cm is favored to represent a postoperative collection such as a seroma. 3. Severe fatty atrophy of the subscapularis, supraspinatus, and infraspinatus muscles. Electronically Signed   By: Harrietta Sherry M.D.   On: 02/09/2024 19:07    CT TEMPORAL BONES W WO CONTRAST Result Date: 02/09/2024 EXAM: CT TEMPORAL BONES WITH AND WITHOUT CONTRAST 02/09/2024 05:08:46 PM TECHNIQUE: CT of the temporal bones was performed with and without the administration of intravenous contrast. 75 mL of iohexol  (OMNIPAQUE ) 350 MG/ML injection was administered. Multiplanar reformatted images are provided for review. Automated exposure control, iterative reconstruction, and/or weight based adjustment of the mA/kV was utilized to reduce the radiation dose to as low as reasonably achievable. COMPARISON: CT head and MRI brain 02/08/2024. CLINICAL HISTORY: Sepsis with suspected source of suppurative left acute otitis media. FINDINGS: RIGHT TEMPORAL BONE: EXTERNAL AUDITORY CANAL: The right external auditory canal appears mildly narrowed at the junction of the cartilaginous and osseous portions. There is no evidence of mass. Scutum is intact. MIDDLE EAR CAVITY: The right middle ear cavity is clear. The ossicles are normal in position and morphology without erosive change. Normal appearance of the right tympanic membrane. MASTOID AIR CELLS: Moderate right mastoid effusion with significant opacification of mastoid air cells most pronounced posteriorly and inferiorly. There are residual aerated mastoid air  cells superiorly. There is partial opacification of the mastoid antrum. Osseous septations of the mastoid air cells are intact. There is no evidence of coalescent mastoiditis. INNER EAR: The cochlea and vestibule are unremarkable. Normal mineralization of the otic capsule. Normal semicircular canals. The vestibular aqueduct is not dilated. INTERNAL AUDITORY CANAL: Unremarkable. Normal bony canal of the facial nerve. LEFT TEMPORAL BONE: EXTERNAL AUDITORY CANAL: There is additional mild soft tissue thickening along the osseous left external auditory canal. The left scutum is intact. No bony erosion. MIDDLE EAR CAVITY: There is complete opacification of the left middle ear with soft tissue fluid surrounding the ossicles. An underlying small mass cannot be excluded; however, there are no erosive changes of the ossicles appreciated. There is likely thickening of the left tympanic membrane, which is partially obscured by opacification of the left middle ear.  There is no evidence of dehiscence of the tegmen tympani or tegmen mastoidium. MASTOID AIR CELLS: Large left mastoid effusion with complete opacification of the mastoid air cells. There is opacification of the mastoid antrum. The osseous septations of the mastoid air cells are intact. There is no evidence of coalescent mastoiditis. INNER EAR: The cochlea and vestibule are unremarkable. Normal mineralization of the otic capsule. Normal semicircular canals. The vestibular aqueduct is not dilated. INTERNAL AUDITORY CANAL: Unremarkable. Normal bony canal of the facial nerve. VASCULAR: Normal jugular bulbs. Normal carotid canals. There is no abnormal enhancement or abnormal soft tissue adjacent to the mastoid temporal bones. There is no evidence of Bezold abscess. BRAIN: Dense calcifications along the visualized falx and tentorium. Unremarkable. ORBITS: No acute abnormality. SINUSES: Mild mucosal thickening in the inferior left maxillary sinus. Additional mild scattered  mucosal thickening in the ethmoid sinuses. OTHER FINDINGS: Punctate calcifications in the partially visualized bilateral parotid glands. Degenerative changes of the bilateral temporomandibular joints. IMPRESSION: 1. Findings compatible with left otomastoiditis. No evidence of coalescent mastoiditis, tegmen dehiscence, or adjacent soft tissue abscess. 2. Complete opacification of the left middle ear with surrounding soft tissue/fluid about the ossicles. Small underlying mass cannot be excluded. There is no ossicular erosion. 3. Moderate right mastoid effusion without coalescent mastoiditis. Electronically signed by: Donnice Mania MD 02/09/2024 06:32 PM EST RP Workstation: [id number]          Assessment/Plan: Diagnosis: R>L weakness-  with hx of Cervical myelopathy- likely  complicated by need for R TKR and R shoulder revision necessity Does the need for close, 24 hr/day medical supervision in concert with the patient's rehab needs make it unreasonable for this patient to be served in a less intensive setting? Yes Co-Morbidities requiring supervision/potential complications: L suppurativa otitis media, TM perforation, ESRD, needs for R shoulder surgery revision,  multiple falls at home with R knee effusion and need for R TKR;  PAD s/p stent for RLE for critical ischemia Due to bowel management, safety, skin/wound care, disease management, medication administration, pain management, and patient education, does the patient require 24 hr/day rehab nursing? Yes Does the patient require coordinated care of a physician, rehab nurse, therapy disciplines of PT and OT to address physical and functional deficits in the context of the above medical diagnosis(es)? Yes Addressing deficits in the following areas: balance, endurance, locomotion, strength, transferring, bathing, dressing, feeding, grooming, and toileting Can the patient actively participate in an intensive therapy program of at least 3 hrs of therapy per  day at least 5 days per week? Yes The potential for patient to make measurable gains while on inpatient rehab is good Anticipated functional outcomes upon discharge from inpatient rehab are modified independent and supervision  with PT, supervision with OT, n/a with SLP. Estimated rehab length of stay to reach the above functional goals is: 2-3 weeks Anticipated discharge destination: Home Overall Rehab/Functional Prognosis: good   RECOMMENDATIONS: This patient's condition is appropriate for continued rehabilitative care in the following setting: CIR Patient has agreed to participate in recommended program. Yes Note that insurance prior authorization may be required for reimbursement for recommended care.   Comment:  Spoke with Neuro and primary team- they feel this weakness is due to infection and Recrudescence  in setting along with R shoulder and R knee pain more than anything- pt's exam IS Consistent with increased weakness throughout, but worse on R than L.  Even in hands and FA, which is not just due to pain, so he really needs rehab  to get back to home doing all his own self care.  2. Spoke with Ortho PA- [name]- he was not clear, with me, but sounds like pt could benefit from Joint effusion being tapped due to moderate effusion and increased pain in RLE.  3. Pt also was told be Dr [name] he needed R shoulder revision as well as R THA- obviously that cannot occur right now, but pt was going to call Dr Brion office to see if possible to do shoulder anytime soon.  Per pt, Dr [name] didn't feel like needed to move fistula of RUE.  4. Will submit for inpt rehab/CIR- did d/w Ortho, [facility] , and Neuro about pt as well as admissions coordinator- was also in room with pt 1 hour in addition to everything else done, reviewing chart- my prior notes for pt from April, 2025- til now.        I spent a total of 94   minutes on total care today- >50% coordination of care- due to  D/w pt for 1 hour  and exam; review of chart in depth, d/w Neuro, Ortho and [facility]  hospitalist and admissions coordinator. Including documentation   Duwaine Barrs, MD 02/11/2024              Routing History  "

## 2024-03-03 NOTE — Progress Notes (Addendum)
 " [facility] Progress Note   Subjective:    Seen and examined patient at bedside. Tolerated yesterday's HD with net UF 2L. No issues. He's being discharged to CIR today.  Objective Vitals:   03/02/24 1943 03/03/24 0030 03/03/24 0436 03/03/24 0730  BP: 116/71 108/63 96/68 113/83  Pulse: 88 92 95 99  Resp:    18  Temp: (!) 100.6 F (38.1 C) 98.6 F (37 C) 98.1 F (36.7 C)   TempSrc:      SpO2: 100% 99% 98% 100%  Weight:      Height:       Physical Exam Gen: Awake, alert, NAD CVS: RRR Resp: Unlabored, normal wob Abd: Soft Ext: Right knee dressing in place, no edema LE's, RUE in sling Neuro: Awake, alert Dialysis Access: RUE AVF  Filed Weights   03/02/24 0500 03/02/24 0742 03/02/24 1127  Weight: 91.5 kg 87.2 kg 85.2 kg    Intake/Output Summary (Last 24 hours) at 03/03/2024 1156 Last data filed at 03/03/2024 0856 Gross per 24 hour  Intake 10 ml  Output --  Net 10 ml    Additional Objective Labs: Basic Metabolic Panel: Recent Labs  Lab 03/01/24 0411 03/02/24 0420 03/03/24 0149  NA 134* 132* 132*  K 4.9 5.2* 4.5  CL 94* 91* 90*  CO2 26 25 29   GLUCOSE 92 86 92  BUN 53* 66* 38*  CREATININE 6.80* 8.34* 5.87*  CALCIUM  9.1 9.4 9.4  PHOS 4.3 4.1 3.0   Liver Function Tests: Recent Labs  Lab 02/27/24 0301 02/28/24 0316 03/01/24 0411 03/02/24 0420 03/03/24 0149  AST 18  --   --   --   --   ALT <5  --   --   --   --   ALKPHOS 98  --   --   --   --   BILITOT 0.4  --   --   --   --   PROT 7.8  --   --   --   --   ALBUMIN  3.0*   < > 2.8* 2.9* 2.9*   < > = values in this interval not displayed.   No results for input(s): LIPASE, AMYLASE in the last 168 hours. CBC: Recent Labs  Lab 02/27/24 0301 02/28/24 0316 02/28/24 0731 02/28/24 1501  WBC 6.2 7.0 8.0  --   NEUTROABS  --   --  5.6  --   HGB 7.8* 8.3* 7.3* 8.2*  HCT 24.3* 26.2* 22.3* 24.0*  MCV 91.0 90.0 88.5  --   PLT 335 324 350  --    Blood Culture    Component Value  Date/Time   SDES TISSUE RIGHT SHOULDER 02/28/2024 1636   SPECREQUEST SWAB C INTRA ARTICULAR SUTURE 02/28/2024 1636   CULT  02/28/2024 1636    NO GROWTH 4 DAYS NO ANAEROBES ISOLATED; CULTURE IN PROGRESS FOR 5 DAYS Performed at [facility], [address], [place], [name]    REPTSTATUS PENDING 02/28/2024 1636    Cardiac Enzymes: No results for input(s): CKTOTAL, CKMB, CKMBINDEX, TROPONINI in the last 168 hours. CBG: Recent Labs  Lab 03/01/24 2319 03/02/24 1221 03/02/24 1615 03/02/24 1942 03/03/24 0730  GLUCAP 96 81 126* 91 83   Iron Studies: No results for input(s): IRON, TIBC, TRANSFERRIN, FERRITIN in the last 72 hours. Lab Results  Component Value Date   INR 1.1 05/27/2023   INR 1.1 05/25/2023   INR 1.1 09/17/2021   Studies/Results: No results found.  Medications:  ceFEPime  (MAXIPIME ) IV Stopped (03/02/24 1126)   vancomycin  1,000 mg (03/02/24 1026)    (feeding supplement) PROSource Plus  30 mL Oral BID BM   aspirin  EC  81 mg Oral Daily   atorvastatin   10 mg Oral Daily   bupivacaine (PF)  10 mL Infiltration Once   Chlorhexidine  Gluconate Cloth  6 each Topical Q0600   Chlorhexidine  Gluconate Cloth  6 each Topical Q0600   cinacalcet   120 mg Oral Q breakfast   clopidogrel   75 mg Oral Daily   darbepoetin (ARANESP ) injection - DIALYSIS  100 mcg Subcutaneous Q Mon-1800   docusate sodium   100 mg Oral BID   feeding supplement (KATE FARMS STANDARD 1.4) Liquid  325 mL Oral Daily   ferrous sulfate   325 mg Oral Q breakfast   folic acid   1 mg Oral Daily   gabapentin   300 mg Oral QHS   insulin  aspart  0-6 Units Subcutaneous TID WC   lidocaine   1 patch Transdermal Q24H   lidocaine   1 patch Transdermal Q24H   midodrine   10 mg Oral TID WC   multivitamin  1 tablet Oral QHS   pantoprazole   40 mg Oral BID AC   psyllium  1 packet Oral Daily   sevelamer  carbonate  2,400 mg Oral TID WC   sodium chloride  flush  3 mL Intravenous Q12H    Dialysis  Orders: MWF NW 4h  B400  87.3kg  2K bath  AVF   Heparin  3000 ESA: Mircera 50 q 2 wks (last 12/15) BMM: Calcitriol  2 mcg PO q HD, cinacalcet  120 mg daily   Assessment/Plan:  -on HD MWF. Next HD 1/21.     Volume/chronic hypotension:  -HD with midodrine  support, UF as tolerated   Anemia of ESRD:  -declines IV alb/ prbc's, hold off on IV Fe, increased ESA dose (hgb 8.2)   Hyperkalemia:  -persistent, mild, had been on lokelma --on hold for now, can resume if needed, k remains wnl   Septic arthritis R knee and right shoulder:  -SP I&D 1/09 per ortho, refused IV so getting po abx--per primary. S/p fluid collection of right shoulder 1/13, highly infectious. ID and ortho following, s/p right shoulder I&D with abx spacer placement -Discussed with pharmacy: plan to continue Vancomycin  IV 1GM and Cefepime  IV 2GM both with HD until 04/10/24.   Otitis media w/ TM perforation:  -s/p course of abx, ENT has seen   R sided weakness:  -main c/o on admission, CVA w/u was negative   Secondary hyperparathyroidism:  -phos at goal  Dispo -Discharged to CIR  Charmaine Piety, NP [facility] 03/03/2024,11:56 AM  LOS: 21 days    "

## 2024-03-03 NOTE — Progress Notes (Addendum)
" °  Inpatient Rehabilitation Admissions Coordinator   I met with patient at bedside and contacted his son, Corey, by phone. I discussed ELOS and goals for CIR admit. They are in agreement. Case discussed with Dr Babs. We will plan to admit today. Acute Team and TOC made aware. Verneita in hemodialysis made aware of admit.  Heron Leavell, RN, MSN Rehab Admissions Coordinator [phone] 03/03/2024 10:21 AM  "

## 2024-03-03 NOTE — Consult Note (Signed)
" °  CLINICAL SUPPORT TEAM - WOUND OSTOMY AND CONTINENCE TEAM  CONSULTATION SERVICES   WOC Nurse-Inpatient Note  WOC Nurse Consult Note: Reason for Consult: sacral wound found on admission to rehab  Wound type: Unstageable Pressure Injury sacrum  Pressure Injury POA: Yes to rehab  Measurement: 2.8 cm x 2.1 cm per nursing flowsheet  Wound bed: 80% tan nonviable 20% red moist  Drainage (amount, consistency, odor) see nursing flowsheet  Periwound: erythema  Dressing procedure/placement/frequency: Cleanse sacral wound with Vashe, do not rinse.  Apply 1/4 thick layer of Santyl  to wound bed daily, top with saline moist gauze, dry gauze and secure with silicone foam or ABD pad and clothe tape whichever is preferred.   POC discussed with bedside nurse. WOC team will not follow. Reconsult if further needs arise.   Thank you,    Makayla Confer MSN, RN-BC, CWOCN  "

## 2024-03-03 NOTE — Progress Notes (Signed)
 Occupational Therapy Treatment Patient Details Name: Carl Loppnow Sr. MRN: [id number] DOB: 05/01/1960 Today's Date: 03/03/2024   History of present illness Pt is a 64 y.o. male who presented 02/07/24 with progressive R-sided weakness and pain along with L ear pain and drainage. Pt septic with L acute otitis media. MRI brain showed no acute findings. MRI cervical spine demonstrated no interval change from prior surgery and no clear etiology for current weakness. Imaging did note prior left-sided strokes, without evidence of acute stroke or recrudescence. Neurosurgery consulted and recommends no acute intervention. R weakness suspected recrudescence of prior symptoms in setting of severe infection. Underwent R knee I&D on 02/21/24 for septic arthritis.  PMH includes: recurrent GIB, epilepsy, PRES, TIA, HTN, renal cell carcinoma, CVA, OSA, ESRD, DM II, Ischemic colitis, anterior cervical corpectomy C4 and C3-C5 fusion (Dr. [name], 05/25/2023), PAD s/p angioplasty/stenting right SFA and popliteal arteries 12/09/2023 for critical limb ischemia.   OT comments  Pt making incremental progress towards OT goals and agreeable for OT session. Guided pt in standing tolerance activities using Stedy while at sink. Pt requires Min A for bed mobility d/t RLE weakness and overall Mod A x 2 for standing in [place]. Pt able to tolerate standing briefly without Stedy pad support at sink but difficulty achieving full upright posture due to pain and weakness. Continue to recommend intensive rehab services > 3 hours therapy per day upon DC.      If plan is discharge home, recommend the following:  Two people to help with walking and/or transfers;Two people to help with bathing/dressing/bathroom;Assistance with cooking/housework;Help with stairs or ramp for entrance;Assist for transportation   Equipment Recommendations  Other (comment) (TBD)    Recommendations for Other Services Rehab consult    Precautions / Restrictions  Precautions Precautions: Fall Recall of Precautions/Restrictions: Impaired Precaution/Restrictions Comments: watch HR, R knee buckles Restrictions Weight Bearing Restrictions Per Provider Order: Yes RUE Weight Bearing Per Provider Order: Weight bearing as tolerated RLE Weight Bearing Per Provider Order: Weight bearing as tolerated       Mobility Bed Mobility Overal bed mobility: Needs Assistance Bed Mobility: Supine to Sit, Sit to Supine     Supine to sit: Min assist Sit to supine: Min assist   General bed mobility comments: assistance with RLE    Transfers Overall transfer level: Needs assistance Equipment used: Ambulation equipment used Transfers: Sit to/from Stand Sit to Stand: Mod assist, +2 physical assistance, +2 safety/equipment, From elevated surface           General transfer comment: Mod A x 2 to stand in [place] from elevated bedside. Pt with tall stature. difficulty standing fully upright and tucking bottom in. Transfer via Lift Equipment: Stedy   Balance Overall balance assessment: Needs assistance Sitting-balance support: Feet supported Sitting balance-Leahy Scale: Good     Standing balance support: Bilateral upper extremity supported Standing balance-Leahy Scale: Zero                             ADL either performed or assessed with clinical judgement   ADL Overall ADL's : Needs assistance/impaired     Grooming: Oral care;Wash/dry face;Wash/dry hands Grooming Details (indicate cue type and reason): Seated in [place] at sink. pt implementing compensatory strategies due to impaired use of RUE. Did cue to attempt standing to spit in sink after brushing teeth. Mod A x 2 to stand from [place] pads to do so. Cued for full posture pushing on Stedy bars  though pt unable to stand fully upright due to R knee/R shoulder pain when attempting                                    Extremity/Trunk Assessment Upper Extremity Assessment Upper  Extremity Assessment: Right hand dominant;RUE deficits/detail RUE Deficits / Details: R UE pain with movement. AAROM assist to reach up to stedy bars, grasp fair RUE Coordination: decreased gross motor   Lower Extremity Assessment Lower Extremity Assessment: Defer to PT evaluation        Vision   Vision Assessment?: No apparent visual deficits   Perception     Praxis     Communication Communication Communication: No apparent difficulties   Cognition Arousal: Alert Behavior During Therapy: WFL for tasks assessed/performed Cognition: No apparent impairments             OT - Cognition Comments: internally distracted at times                 Following commands: Intact        Cueing   Cueing Techniques: Verbal cues, Tactile cues, Visual cues  Exercises      Shoulder Instructions       General Comments      Pertinent Vitals/ Pain       Pain Assessment Pain Assessment: Faces Faces Pain Scale: Hurts even more Pain Location: R knee and R shoulder Pain Descriptors / Indicators: Grimacing, Guarding, Sore Pain Intervention(s): Monitored during session, Premedicated before session  Home Living                                          Prior Functioning/Environment              Frequency  Min 2X/week        Progress Toward Goals  OT Goals(current goals can now be found in the care plan section)  Progress towards OT goals: Progressing toward goals  Acute Rehab OT Goals Patient Stated Goal: keep working on strength OT Goal Formulation: With patient Time For Goal Achievement: 03/04/24 Potential to Achieve Goals: Good  Plan      Co-evaluation                 AM-PAC OT 6 Clicks Daily Activity     Outcome Measure   Help from another person eating meals?: A Little Help from another person taking care of personal grooming?: A Little Help from another person toileting, which includes using toliet, bedpan, or urinal?:  A Lot Help from another person bathing (including washing, rinsing, drying)?: A Lot Help from another person to put on and taking off regular upper body clothing?: A Little Help from another person to put on and taking off regular lower body clothing?: A Lot 6 Click Score: 15    End of Session Equipment Utilized During Treatment: Gait belt  OT Visit Diagnosis: Unsteadiness on feet (R26.81);Muscle weakness (generalized) (M62.81);History of falling (Z91.81);Pain Pain - Right/Left: Right Pain - part of body: Knee   Activity Tolerance Patient tolerated treatment well   Patient Left in bed;with call bell/phone within reach;with bed alarm set   Nurse Communication Mobility status        Time: 8940-8870 OT Time Calculation (min): 30 min  Charges: OT General Charges $OT Visit: 1 Visit OT Treatments $Self  Care/Home Management : 8-22 mins $Therapeutic Activity: 8-22 mins  Carl NOVAK, OTR/L Acute Rehab Services Office: [phone]   [name] 03/03/2024, 12:28 PM

## 2024-03-03 NOTE — H&P (Signed)
 Physical Medicine and Rehabilitation Admission H&P        Chief Complaint  Patient presents with   Functional deficits due to debility in setting of septic arthritis and hx of cervical myelopathy.       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 He also reported increase in right sided weakness and NS consulted. MRI C spine done showing mild stenosis at C3-C4 and not cord compression--no neurosurgical intervention needed per Dr. Colon. MRI brain negative for stroke but showed large bilateral mastoid effusions and Dr. Merrianne recommended ENT consult due to concerns of mastoiditis.[name] consulted who felt that CT without evidence of coalescent mastoiditis and exam consistent with left suppurative otitis media and right EAC occluded with cerumen. He  recommended was treated with Zosyn  and ciprodex  drops X 14 days. SABRA    He continued to have limitation due to weakness and right shoulder pain. MRI right shoulder showed fluid collection 5.1 X 2.9 X 1.9 in posterior subacromial/subdeltoid space could be seroma and severe fatty atrophy of subcapularis, supraspinatus and infraspinatus muscles. MRI lumbar spine revealed L3-L4 moderate right and mild left foraminal stenosis, moderate right L4-L5 stenosis, right lateral L3-L4 disc edema with mild surrounding marrow edema and severe degenerative changes.  MRI thoracic spine revealed severe left foraminal stenosis T11-T12, moderate right foraminal stenosis T2-T3, T8-T9 and T9-T10 and T11 and T12 endplate edema question degenerative or  acute/subacute Fx. Dr. Onetha reviewed films and felt X rays with degenerative changes and no acute Fx. Right sided weakness felt to be recrudescence of SCI symptoms in setting of infection.    Blood cultures negative. Right knee showed tricompartmental OA. Ortho re-consulted due to on onging knee pain and knee aspirated showing 71,000 WBC with 94 % neutrophils and no organisms. 2 D echo done revealing EF 65-70%, moderate ot severe mitral calcification and linear calcified mass on AV with moderate thickening and mild to moderate AVR. Cardiology felt that patient with degenerative changes on valves and intermediate cardiac risk. He underwent I and D right knee by Dr. [name] on 01/09 and scant purulent synovial fluid encountered. Dr. Fleeta Rothman consulted and recommended Cefepime  and Vanc with HD.  He spiked fever on 01/12 and Dr. Fleeta Rothman recommended evaluation of right shoulder fluid collection which was aspirated X 2 showing  64,000 WBC and  93,5000 WBC but  no organisms seen.  He underwent I and D right shoulder with placement of antibiotic beads (instead of liner replacement due to anemia/Jehovah's witness) by Dr. [name] on 01/16 with recommendations to aspirate shoulder in 8 weeks.       Review of Systems  Eyes:  Negative for blurred vision.  Respiratory:  Negative for cough and shortness of breath.   Cardiovascular:  Negative for chest pain.  Gastrointestinal:  Negative for constipation and heartburn.  Genitourinary:        Does not have kidneys  Musculoskeletal:  Positive for joint pain (right shoulder and rigth knee pain) and myalgias.  Neurological:  Positive for sensory change (numbness/tingling BLE.), weakness and headaches.  Psychiatric/Behavioral:  The patient has insomnia.  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     Parathyroid  abnormality      ectopic parathyroid  gland   Presence of arteriovenous fistula for hemodialysis, primary      RUE PER PT RLE   Refusal of blood product      NO WHOLE BLOOD PROUCTS   Renal cell carcinoma (HCC)      s/p hand assisted laparoscopic bilateral nephrectomies 11/29/17, + RCC left   Secondary hyperparathyroidism     Seizures (HCC)      one episode in past, due to elevated Potassium 08/02/20- at least 4 years ago   Sleep apnea      doesn't use CPAP anymore since weight loss   Stroke [facility])      no residual                 Past Surgical History:  Procedure Laterality Date   ABDOMINAL AORTOGRAM W/LOWER EXTREMITY N/A 12/09/2023    Procedure: ABDOMINAL AORTOGRAM W/LOWER EXTREMITY;  Surgeon: Sheree Penne Bruckner, MD;  Location: [facility] INVASIVE CV LAB;  Service: Cardiovascular;  Laterality: N/A;   ANTERIOR CERVICAL CORPECTOMY N/A 05/25/2023    Procedure: ANTERIOR CERVICAL CORPECTOMY CERVICAL FOUR, CERVICAL THREE - CERVICAL FIVE FUSION;  Surgeon: [name] Shove, MD;  Location: [facility] OR;  Service: Neurosurgery;  Laterality: N/A;   AV FISTULA PLACEMENT Right      right arm   BIOPSY   08/01/2019    Procedure: BIOPSY;  Surgeon: Kristie Lamprey, MD;  Location: [facility] ENDOSCOPY;  Service: Endoscopy;;   BIOPSY   11/18/2020    Procedure: BIOPSY;  Surgeon: Stacia Glendia BRAVO, MD;  Location: [facility] ENDOSCOPY;  Service: Gastroenterology;;   BIOPSY   09/13/2021    Procedure: BIOPSY;  Surgeon: Wilhelmenia Aloha Raddle., MD;  Location: [facility] ENDOSCOPY;  Service: Gastroenterology;;   CATARACT EXTRACTION W/ INTRAOCULAR  LENS  IMPLANT, BILATERAL       COLON SURGERY       COLONOSCOPY N/A 08/04/2015    Procedure: COLONOSCOPY;  Surgeon: Gordy CHRISTELLA Starch, MD;  Location: [facility] ENDOSCOPY;  Service: Endoscopy;  Laterality: N/A;   COLONOSCOPY   2017    JMP@ [facility]-good prep-mass -recall 1 yr   COLONOSCOPY N/A 09/13/2021    Procedure: COLONOSCOPY;  Surgeon: Mansouraty, Aloha Raddle., MD;  Location: [facility] ENDOSCOPY;  Service: Gastroenterology;  Laterality: N/A;   COLONOSCOPY WITH PROPOFOL  N/A 11/25/2020    Procedure: COLONOSCOPY WITH PROPOFOL ;  Surgeon: Federico Rosario BROCKS, MD;  Location: [facility] ENDOSCOPY;  Service: Gastroenterology;  Laterality: N/A;   COLONOSCOPY WITH PROPOFOL  N/A 09/23/2021    Procedure: COLONOSCOPY WITH PROPOFOL ;  Surgeon: San Sandor GAILS, DO;  Location: [facility] ENDOSCOPY;  Service: Gastroenterology;  Laterality: N/A;   ENTEROSCOPY N/A 09/23/2021    Procedure: ENTEROSCOPY;  Surgeon: San Sandor GAILS, DO;  Location: [facility] ENDOSCOPY;  Service: Gastroenterology;  Laterality: N/A;  Will place order for video capsule study as we may opt to place it during procedure   ESOPHAGOGASTRODUODENOSCOPY N/A 08/01/2019    Procedure: ESOPHAGOGASTRODUODENOSCOPY (EGD);  Surgeon: Kristie Lamprey, MD;  Location: [facility] ENDOSCOPY;  Service: Endoscopy;  Laterality: N/A;   ESOPHAGOGASTRODUODENOSCOPY N/A 11/18/2020    Procedure: ESOPHAGOGASTRODUODENOSCOPY (EGD);  Surgeon: Stacia Glendia BRAVO, MD;  Location: [facility] ENDOSCOPY;  Service: Gastroenterology;  Laterality: N/A;   ESOPHAGOGASTRODUODENOSCOPY (EGD) WITH PROPOFOL  N/A 08/04/2019    Procedure: ESOPHAGOGASTRODUODENOSCOPY (EGD) WITH PROPOFOL ;  Surgeon: Leigh Elspeth SQUIBB, MD;  Location: [facility] ENDOSCOPY;  Service: Gastroenterology;  Laterality: N/A;   ESOPHAGOGASTRODUODENOSCOPY (EGD) WITH PROPOFOL  N/A 09/13/2021    Procedure: ESOPHAGOGASTRODUODENOSCOPY (EGD) WITH PROPOFOL ;  Surgeon: Wilhelmenia Aloha Raddle., MD;  Location: [facility] ENDOSCOPY;  Service: Gastroenterology;  Laterality: N/A;   GIVENS CAPSULE STUDY N/A 09/23/2021     Procedure: GIVENS CAPSULE STUDY;  Surgeon: San Sandor GAILS, DO;  Location: [facility] ENDOSCOPY;  Service: Gastroenterology;  Laterality: N/A;   graft left arm Left      for dialysis x 2. Removed   HARDWARE REMOVAL Right 02/28/2024    Procedure: RIGHT SHOULDER ARTHROSCOPIC WASHOUT AND PLACEMENT OF ANTIBIOTIC BEADS;  Surgeon: [name] Cordella Glendia, MD;  Location: [facility] OR;  Service: Orthopedics;  Laterality: Right;  RIGHT SHOULDER HARDWARE REMOVAL, PLACEMENT OF ANTIBIOTIC SPACER   HOT HEMOSTASIS   11/18/2020    Procedure: HOT HEMOSTASIS (ARGON PLASMA COAGULATION/BICAP);  Surgeon: Stacia Glendia BRAVO, MD;  Location: [facility] ENDOSCOPY;  Service: Gastroenterology;;   HOT HEMOSTASIS N/A 09/23/2021    Procedure: HOT HEMOSTASIS (ARGON PLASMA COAGULATION/BICAP);  Surgeon: San Sandor GAILS, DO;  Location: [facility] ENDOSCOPY;  Service: Gastroenterology;  Laterality: N/A;   INSERTION OF DIALYSIS CATHETER        Rt chest   IR US  GUIDE BX ASP/DRAIN   02/25/2024   KNEE ARTHROSCOPY Right 02/21/2024    Procedure: ARTHROSCOPY, KNEE;  Surgeon: [name] Kay HERO, MD;  Location: [facility] OR;  Service: Orthopedics;  Laterality: Right;   LAPAROSCOPIC RIGHT COLECTOMY N/A 08/05/2015    Procedure: LAPAROSCOPIC RIGHT COLECTOMY- ASCENDING;  Surgeon: Jina Nephew, MD;  Location: [facility] OR;  Service: General;  Laterality: N/A;   LOWER EXTREMITY ANGIOGRAPHY N/A 12/09/2023    Procedure: Lower Extremity Angiography;  Surgeon: Sheree Penne Bruckner, MD;  Location: [facility] INVASIVE CV LAB;  Service: Cardiovascular;  Laterality: N/A;   LOWER EXTREMITY INTERVENTION N/A 12/09/2023    Procedure: LOWER EXTREMITY INTERVENTION;  Surgeon: Sheree Penne Bruckner, MD;  Location: [facility] INVASIVE CV LAB;  Service: Cardiovascular;  Laterality: N/A;   MASS EXCISION Left 05/28/2019    Procedure: EXCISION SOFT TISSUE MASS LEFT SHOULDER;  Surgeon: Eletha Boas, MD;  Location: [facility] ORS;  Service: General;  Laterality: Left;   NEPHRECTOMY Bilateral     PARATHYROIDECTOMY N/A  06/12/2016    Procedure: TOTAL PARATHYROIDECTOMY WITH AUTOTRANSPLANTATION TO LEFT FOREARM;  Surgeon: Boas Eletha, MD;  Location: [facility] OR;  Service: General;  Laterality: N/A;   PARATHYROIDECTOMY N/A 10/23/2016    Procedure: PARATHYROIDECTOMY;  Surgeon: Eletha Boas, MD;  Location: [facility] OR;  Service: General;  Laterality: N/A;   REVERSE SHOULDER ARTHROPLASTY Right 08/24/2020    Procedure: REVERSE SHOULDER ARTHROPLASTY;  Surgeon: Cristy Bonner DASEN, MD;  Location: [facility] OR;  Service: Orthopedics;  Laterality: Right;   REVISON OF ARTERIOVENOUS FISTULA Right 07/16/2017    Procedure: REVISION  OF ARTERIOVENOUS FISTULA  Right ARM;  Surgeon: Sheree Penne Bruckner, MD;  Location: [facility] OR;  Service: Vascular;  Laterality: Right;   TOTAL HIP ARTHROPLASTY Left 11/14/2020    Procedure: LEFT TOTAL HIP ARTHROPLASTY ANTERIOR APPROACH;  Surgeon: [name] Kay HERO, MD;  Location: [facility] OR;  Service: Orthopedics;  Laterality: Left;   UPPER GASTROINTESTINAL ENDOSCOPY   2019-03-19    @ [facility]               Family History  Problem Relation Age of Onset   Diabetes Father     Stroke Father     Hypertension Father     Uterine cancer Mother     Lupus Sister     Stroke Sister     Hypertension Sister     Anuerysm Brother          brain   Colon cancer Neg Hx     Esophageal cancer Neg Hx     Stomach cancer Neg Hx     Pancreatic cancer Neg Hx     Liver disease Neg Hx     Colon polyps Neg Hx     Rectal cancer Neg Hx            Social History:  Single--wife passed away in  March 18, 2022. Lives with son and now in ground floor apartment. He  reports that he quit smoking about 33 years ago. His smoking use included cigarettes. He has never used smokeless tobacco. He reports that he does not currently use alcohol after a past usage of about 1.0 standard drink of alcohol per week. He reports that he does not currently use drugs after having used the following drugs: Marijuana.     Allergies: [Allergies]  [Allergies]      Allergen Reactions    Infed [Iron Dextran] Other (See Comments)      Decreased BP    Vicodin [Hydrocodone -Acetaminophen ] Other (See Comments)      Decreased BP   Hydrocodone  Other (See Comments)      Unable to recall reaction   Voltaren [Diclofenac] Other (See Comments)      Hypotension              Medications Prior to Admission  Medication Sig Dispense Refill   acetaminophen  (TYLENOL ) 650 MG CR tablet Take 1,300 mg by mouth 2 (two) times daily.       amoxicillin  (AMOXIL ) 500 MG capsule TAKE FOUR CAPSULES BY MOUTH ONE HOUR BEFORE DENTAL WORK/PROCEDURE 8 capsule 0   Aspirin -Caffeine  (BAYER BACK & BODY PAIN EX ST PO) Take 2 tablets by mouth 2 (two) times daily as needed (pain).       augmented betamethasone  dipropionate (DIPROLENE -AF) 0.05 % ointment Apply 1 Application topically 2 (two) times daily as needed for rash.       CALCITRIOL  PO Take 2 mcg by mouth See admin instructions. Medication provided and administered by [facility] dialysis clinic. Give 2mcg by mouth 3 times per week week with every dialysis treatment.       clopidogrel  (PLAVIX ) 75 MG tablet Take 1 tablet (75 mg total) by mouth daily. 30 tablet 11   diphenhydrAMINE  (BENADRYL ) 25 mg capsule Take 1 capsule (25 mg total) by mouth every 6 (six) hours as needed for itching.       gabapentin  (NEURONTIN ) 100 MG capsule Take 3 capsules (300 mg total) by mouth at bedtime. 30 capsule 2   heparin  sodium, porcine, 1000 UNIT/ML injection Inject 3,000 Units into the vein See admin instructions. Medication  provided and administered by [facility] dialysis clinic. Bolus; IV push 3000u with every dialysis treatment 3 times per week (total treatment 240 minutes).       ibuprofen (ADVIL) 800 MG tablet Take 800 mg by mouth 2 (two) times daily as needed (pain).       lidocaine  (LIDODERM ) 5 % Place 1 patch onto the skin daily. Remove & Discard patch within 12 hours 30 patch 0   LOKELMA  10 g PACK packet Take 1 packet by mouth daily.       midodrine  (PROAMATINE ) 10 MG  tablet Take 1 tablet (10 mg total) by mouth every dialysis (30 min before each dialysis.). 15 tablet 5   Misc Natural Products (TURMERIC, CURCUMIN, PO) Take 1 tablet by mouth daily.       multivitamin (RENA-VIT) TABS tablet Take 1 tablet by mouth daily.       [EXPIRED] ofloxacin  (FLOXIN ) 0.3 % OTIC solution Place 10 drops into the left ear daily for 7 days. 10 mL 0   Psyllium (METAMUCIL PO) Take 1 Scoop by mouth daily as needed (GI issues).       sevelamer  carbonate (RENVELA ) 800 MG tablet Take 3,200 mg by mouth 3 (three) times daily with meals.       amoxicillin -clavulanate (AUGMENTIN ) 875-125 MG tablet Take 1 tablet by mouth 2 (two) times daily. 10 day course. (Patient not taking: Reported on 02/11/2024)       atorvastatin  (LIPITOR) 10 MG tablet Take 1 tablet (10 mg total) by mouth every Thursday. (Patient not taking: Reported on 02/11/2024) 30 tablet 2   cyclobenzaprine  (FLEXERIL ) 10 MG tablet Take 1 tablet (10 mg total) by mouth 3 (three) times daily as needed for muscle spasms. (Patient not taking: Reported on 02/11/2024) 30 tablet 0              Home: Home Living Family/patient expects to be discharged to:: Private residence Living Arrangements: Children Available Help at Discharge: Family, Available 24 hours/day Type of Home: Apartment Home Access: Level entry Home Layout: One level Bathroom Shower/Tub: Engineer, Manufacturing Systems: Standard (+ toilet riser) Bathroom Accessibility: Yes Home Equipment: Agricultural Consultant (2 wheels), Grab bars - tub/shower, Government social research officer, Wheelchair - manual  Lives With: Son   Functional History: Prior Function Prior Level of Function : Independent/Modified Independent Mobility Comments: Mod I using RW; x3 falls in past month due to R knee buckling ADLs Comments: Mod I ADLs, was driving but difficulty driving lately due to R leg not functioning properly   Functional Status:  Mobility: Bed Mobility Overal bed mobility: Needs Assistance Bed  Mobility: Supine to Sit, Sit to Supine Supine to sit: Min assist Sit to supine: Min assist General bed mobility comments: assistance with RLE Transfers Overall transfer level: Needs assistance Equipment used: Rolling walker (2 wheels), None Transfers: Sit to/from Stand Sit to Stand: Mod assist, Max assist Bed to/from chair/wheelchair/BSC transfer type:: Via Lift equipment Squat pivot transfers: Max assist, +2 physical assistance, +2 safety/equipment, From elevated surface Step pivot transfers: Mod assist, +2 physical assistance, +2 safety/equipment, From elevated surface  Lateral/Scoot Transfers: Max assist Transfer via Lift Equipment: Stedy General transfer comment: attempted 3x from EOB, Max A with RW with difficutly getting the RUE up onto the walker. Mod A with face to face sit to stand with pt holding onto therapist arm with R hand and pushing up from EOB with L hand for 2 attempts. Pt was unable to get fully to standing for any trial. Ambulation/Gait Ambulation/Gait assistance: Mod  assist, +2 physical assistance Gait Distance (Feet): 0 Feet Assistive device: Rolling walker (2 wheels) Gait Pattern/deviations: Step-to pattern, Decreased step length - right, Decreased step length - left, Decreased stride length, Knees buckling, Trunk flexed General Gait Details: unable at this time. Gait velocity: decreased Gait velocity interpretation: <1.31 ft/sec, indicative of household ambulator Pre-gait activities: Pt took small steps from EOB to recliner with R knee blocked throughout; slight improved with incerased time up but continued to require knee block Mod A +2 for wgt shifting, stepping and balance/LE strength.   ADL: ADL Overall ADL's : Needs assistance/impaired Eating/Feeding: Set up, Bed level Eating/Feeding Details (indicate cue type and reason): d/t impaired FMC in dominant RUE Grooming: Wash/dry hands, Wash/dry face, Oral care, Set up, Supervision/safety, Sitting Grooming  Details (indicate cue type and reason): on EOB Upper Body Bathing: Sitting, Minimal assistance Lower Body Bathing: Maximal assistance, Sit to/from stand, +2 for safety/equipment, +2 for physical assistance Upper Body Dressing : Minimal assistance, Sitting Lower Body Dressing: Maximal assistance, +2 for physical assistance, +2 for safety/equipment Toilet Transfer: Moderate assistance, +2 for physical assistance, BSC/3in1, Regular Toilet [employer]) Toilet Transfer Details (indicate cue type and reason): transfer to regular toilet with 3n1 over commode with Stedy and mod assists +2 to stand Toileting- Clothing Manipulation and Hygiene: Moderate assistance Toileting - Clothing Manipulation Details (indicate cue type and reason): mod to max assist for clothing management and patient able to perform toilet hygiene seated Functional mobility during ADLs: +2 for safety/equipment, +2 for physical assistance, Moderate assistance, Maximal assistance (sit>stands only with RW) General ADL Comments: ADL participation impacted by pain   Cognition: Cognition Overall Cognitive Status: History of cognitive impairments - at baseline Arousal/Alertness: Awake/alert Orientation Level: Oriented X4 Attention: Sustained Sustained Attention: Impaired Sustained Attention Impairment: Verbal basic Memory: Impaired Memory Impairment: Decreased short term memory, Decreased recall of new information Decreased Short Term Memory: Verbal basic Awareness: Appears intact Problem Solving: Appears intact Safety/Judgment: Appears intact Cognition Arousal: Alert Behavior During Therapy: WFL for tasks assessed/performed Overall Cognitive Status: History of cognitive impairments - at baseline   Physical Exam: Blood pressure 113/83, pulse 99, temperature 98.1 F (36.7 C), resp. rate 18, height 6' 1 (1.854 m), weight 85.2 kg, SpO2 100%. Physical Exam Vitals and nursing note reviewed.  Constitutional:      General: He is not  in acute distress.    Appearance: Normal appearance. He is obese.  HENT:     Head: Normocephalic.     Right Ear: External ear normal.     Left Ear: External ear normal.     Nose: Nose normal.     Mouth/Throat:     Mouth: Mucous membranes are moist.  Eyes:     Conjunctiva/sclera: Conjunctivae normal.  Cardiovascular:     Rate and Rhythm: Normal rate and regular rhythm.     Heart sounds: No murmur heard.    No gallop.  Pulmonary:     Effort: Pulmonary effort is normal. No respiratory distress.     Breath sounds: Normal breath sounds. No wheezing.  Abdominal:     General: Bowel sounds are normal. There is no distension.     Tenderness: There is no abdominal tenderness.  Musculoskeletal:     Cervical back: Normal range of motion.     Comments: Dry dressing with opsite on right shoulder. Right knee with Kerlix and ace wrap (surgical dressing). Both areas are still swollen andtender to palpation and with attempts at PROM  Skin:    General: Skin is  warm and dry.     Comments: Bilateral lower legs and feet with dry flaky skin.   Neurological:     Mental Status: He is alert and oriented to person, place, and time.     Comments: Alert and oriented x 3. Normal insight and awareness. Intact Memory. Normal language and speech. Cranial nerve exam unremarkable. MMT: LUE grossly 4+ to 5/5. RUE limited by pain in right shoulder. Does have hat least 4/5 elbow and 4+ to 5/5 at hand and wrist. RLE limited proximally due to knee pain. Right ADF/PF is 4+/5. LLE 3-4/5 prox to 5/5 distally. Decreased LT in both feet. No abnl resting tone. SABRA    Psychiatric:        Mood and Affect: Mood normal.        Thought Content: Thought content normal.       Lab Results Last 48 Hours        Results for orders placed or performed during the hospital encounter of 02/08/24 (from the past 48 hours)  Glucose, capillary     Status: None    Collection Time: 03/01/24 11:52 AM  Result Value Ref Range     Glucose-Capillary 91 70 - 99 mg/dL      Comment: Glucose reference range applies only to samples taken after fasting for at least 8 hours.  Glucose, capillary     Status: None    Collection Time: 03/01/24  4:00 PM  Result Value Ref Range    Glucose-Capillary 85 70 - 99 mg/dL      Comment: Glucose reference range applies only to samples taken after fasting for at least 8 hours.  Glucose, capillary     Status: None    Collection Time: 03/01/24 11:19 PM  Result Value Ref Range    Glucose-Capillary 96 70 - 99 mg/dL      Comment: Glucose reference range applies only to samples taken after fasting for at least 8 hours.  Renal function panel     Status: Abnormal    Collection Time: 03/02/24  4:20 AM  Result Value Ref Range    Sodium 132 (L) 135 - 145 mmol/L    Potassium 5.2 (H) 3.5 - 5.1 mmol/L    Chloride 91 (L) 98 - 111 mmol/L    CO2 25 22 - 32 mmol/L    Glucose, Bld 86 70 - 99 mg/dL      Comment: Glucose reference range applies only to samples taken after fasting for at least 8 hours.    BUN 66 (H) 8 - 23 mg/dL    Creatinine, Ser 1.65 (H) 0.61 - 1.24 mg/dL    Calcium  9.4 8.9 - 10.3 mg/dL    Phosphorus 4.1 2.5 - 4.6 mg/dL    Albumin  2.9 (L) 3.5 - 5.0 g/dL    GFR, Estimated 7 (L) >60 mL/min      Comment: (NOTE) Calculated using the CKD-EPI Creatinine Equation (2021)      Anion gap 16 (H) 5 - 15      Comment: Performed at [facility], [address], [place], [name]  Vancomycin , random     Status: None    Collection Time: 03/02/24  4:20 AM  Result Value Ref Range    Vancomycin  Rm 20 ug/mL      Comment: Performed at [facility], [address], [place], [name]  Glucose, capillary     Status: None    Collection Time: 03/02/24 12:21 PM  Result  Value Ref Range    Glucose-Capillary 81 70 - 99 mg/dL      Comment: Glucose reference range applies only to samples taken after fasting for at least 8 hours.  Glucose, capillary     Status: Abnormal     Collection Time: 03/02/24  4:15 PM  Result Value Ref Range    Glucose-Capillary 126 (H) 70 - 99 mg/dL      Comment: Glucose reference range applies only to samples taken after fasting for at least 8 hours.  Glucose, capillary     Status: None    Collection Time: 03/02/24  7:42 PM  Result Value Ref Range    Glucose-Capillary 91 70 - 99 mg/dL      Comment: Glucose reference range applies only to samples taken after fasting for at least 8 hours.  Renal function panel     Status: Abnormal    Collection Time: 03/03/24  1:49 AM  Result Value Ref Range    Sodium 132 (L) 135 - 145 mmol/L    Potassium 4.5 3.5 - 5.1 mmol/L    Chloride 90 (L) 98 - 111 mmol/L    CO2 29 22 - 32 mmol/L    Glucose, Bld 92 70 - 99 mg/dL      Comment: Glucose reference range applies only to samples taken after fasting for at least 8 hours.    BUN 38 (H) 8 - 23 mg/dL    Creatinine, Ser 4.12 (H) 0.61 - 1.24 mg/dL    Calcium  9.4 8.9 - 10.3 mg/dL    Phosphorus 3.0 2.5 - 4.6 mg/dL    Albumin  2.9 (L) 3.5 - 5.0 g/dL    GFR, Estimated 10 (L) >60 mL/min      Comment: (NOTE) Calculated using the CKD-EPI Creatinine Equation (2021)      Anion gap 13 5 - 15      Comment: Performed at [facility], [address] [place], [name]  Glucose, capillary     Status: None    Collection Time: 03/03/24  7:30 AM  Result Value Ref Range    Glucose-Capillary 83 70 - 99 mg/dL      Comment: Glucose reference range applies only to samples taken after fasting for at least 8 hours.      Imaging Results (Last 48 hours)  No results found.         Blood pressure 113/83, pulse 99, temperature 98.1 F (36.7 C), resp. rate 18, height 6' 1 (1.854 m), weight 85.2 kg, SpO2 100%.   Medical Problem List and Plan: 1. Functional deficits secondary to septic right knee and shoulder             -patient may shower if incisons are covered             -ELOS/Goals: 15-20 days, min assist goals with PT and OT, W/C level? 2.   Antithrombotics:  -DVT/anticoagulation:  Pharmaceutical: Heparin              -antiplatelet therapy: ASA/Plavix  3. Pain Management: oxycodone  prn.             -will schedule him oxycodone  in AM and PM prior to activities 4. Mood/Behavior/Sleep:  LCSW to follow for evaluation and support.              -antipsychotic agents: N/A             --Melatonin prn for insomnia 5. Neuropsych/cognition: This patient is capable of making decisions on his own  behalf. 6. Skin/Wound Care: Surgical dressing on right shoulder and knee--will contact surgeon regarding dressing change 7. Fluids/Electrolytes/Nutrition: Strict I/O. Renal diet w/1200 cc/FR.  8.  Septic arthritis R-knee/R-shoulder: To continue Cefepime  and Vanc with HD thru 04/10/24             --shoulder to be aspirated in 8 weeks per Dr. [name]? 9.  Chronic hypotension: On midodrine  10  mg TID with prn doses  10. ESRD: HD MWF at the end of the day to help with tolerance of therapy.              --chronic hyponatremia managed with HD/FR 11. T2DM: A1C- 5.1. Will continue to monitor BS ac/hs and if controlled decrease to twice a day 12. Anemia of chronic disease: Jehovah's witness and refuses blood transfusion 13. Otitis media with TM perforation: Has completed antibiotic course.  --Follow up with [name] for evaluation.  14. Cervical myelopathy: Gabapentin  for neuropathy 15. PAD s/p R-SFA/popliteal lithotripsy/Stent: On ASA, Plavix  and Lipitor.      Sharlet GORMAN Schmitz, PA-C 03/03/2024   I have personally performed a face to face diagnostic evaluation of this patient and formulated the key components of the plan.  Additionally, I have personally reviewed laboratory data, imaging studies, as well as relevant notes and concur with the physician assistant's documentation above.  The patient's status has not changed from the original H&P.  Any changes in documentation from the acute care chart have been noted above.  Arthea IVAR Gunther, MD, [facility]

## 2024-03-03 NOTE — H&P (Signed)
 "   Physical Medicine and Rehabilitation Admission H&P    Chief Complaint  Patient presents with   Functional deficits due to debility in setting of septic arthritis and hx of cervical myelopathy.     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 He also reported increase in right sided weakness and NS consulted. MRI C spine done showing mild stenosis at C3-C4 and not cord compression--no neurosurgical intervention needed per Dr. Colon. MRI brain negative for stroke but showed large bilateral mastoid effusions and Dr. Merrianne recommended ENT consult due to concerns of mastoiditis.[name] consulted who felt that CT without evidence of coalescent mastoiditis and exam consistent with left suppurative otitis media and right EAC occluded with cerumen. He  recommended was treated with Zosyn  and ciprodex  drops X 14 days. SABRA   He continued to have limitation due to weakness and right shoulder pain. MRI right shoulder showed fluid collection 5.1 X 2.9 X 1.9 in posterior subacromial/subdeltoid space could be seroma and severe fatty atrophy of subcapularis, supraspinatus and infraspinatus muscles. MRI lumbar spine revealed L3-L4 moderate right and mild left foraminal stenosis, moderate right L4-L5 stenosis, right lateral L3-L4 disc edema with mild surrounding marrow edema and severe degenerative changes.  MRI thoracic spine revealed severe left foraminal stenosis T11-T12, moderate right foraminal stenosis T2-T3, T8-T9 and T9-T10 and T11 and T12 endplate edema question degenerative or  acute/subacute Fx. Dr. Onetha reviewed films and felt X rays with degenerative changes and no acute Fx. Right sided weakness felt to be recrudescence of SCI symptoms in setting of infection.   Blood cultures negative. Right knee showed tricompartmental OA. Ortho re-consulted due to on onging knee pain and knee aspirated showing 71,000 WBC with 94 % neutrophils and no organisms. 2 D echo done revealing EF 65-70%, moderate ot severe mitral calcification and linear calcified mass on AV with moderate thickening and mild to moderate AVR. Cardiology felt that patient with degenerative changes on valves and intermediate cardiac risk. He underwent I and D right knee by Dr. [name] on 01/09 and scant purulent synovial fluid encountered. Dr. Fleeta Rothman consulted and recommended Cefepime  and Vanc with HD.  He spiked fever on 01/12 and Dr. Fleeta Rothman recommended evaluation of right shoulder fluid collection which was aspirated X 2 showing  64,000 WBC and  93,5000 WBC but  no organisms seen.  He underwent I and D right shoulder with placement of antibiotic beads (instead of liner replacement due to anemia/Jehovah's witness) by Dr. [name] on 01/16 with recommendations to aspirate shoulder in 8 weeks.     Review of Systems  Eyes:  Negative for blurred vision.  Respiratory:  Negative for cough and shortness of breath.   Cardiovascular:  Negative for chest pain.  Gastrointestinal:  Negative for constipation and heartburn.  Genitourinary:        Does not have kidneys  Musculoskeletal:  Positive for joint pain (right shoulder and rigth knee pain) and myalgias.  Neurological:  Positive for sensory change (numbness/tingling BLE.), weakness and headaches.  Psychiatric/Behavioral:  The patient has insomnia.     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    Parathyroid  abnormality    ectopic parathyroid  gland   Presence of arteriovenous fistula for hemodialysis, primary    RUE PER PT RLE   Refusal of blood product    NO WHOLE BLOOD PROUCTS   Renal cell carcinoma (HCC)    s/p hand assisted laparoscopic bilateral nephrectomies 11/29/17, + RCC left   Secondary hyperparathyroidism    Seizures (HCC)    one episode in past, due to elevated Potassium 08/02/20- at least 4 years ago   Sleep apnea    doesn't use CPAP anymore since weight loss   Stroke [facility])    no residual     Past Surgical History:  Procedure Laterality Date   ABDOMINAL AORTOGRAM W/LOWER EXTREMITY N/A 12/09/2023   Procedure: ABDOMINAL AORTOGRAM W/LOWER EXTREMITY;  Surgeon: Sheree Penne Bruckner, MD;  Location: [facility] INVASIVE CV LAB;  Service: Cardiovascular;  Laterality: N/A;   ANTERIOR CERVICAL CORPECTOMY N/A 05/25/2023   Procedure: ANTERIOR CERVICAL CORPECTOMY CERVICAL FOUR, CERVICAL THREE - CERVICAL FIVE FUSION;  Surgeon: [name] Shove, MD;  Location: [facility] OR;  Service: Neurosurgery;  Laterality: N/A;   AV FISTULA PLACEMENT Right    right arm   BIOPSY  08/01/2019   Procedure: BIOPSY;  Surgeon: Kristie Lamprey, MD;  Location: [facility] ENDOSCOPY;  Service: Endoscopy;;   BIOPSY  11/18/2020   Procedure: BIOPSY;  Surgeon: Stacia Glendia BRAVO, MD;  Location: [facility] ENDOSCOPY;  Service: Gastroenterology;;   BIOPSY  09/13/2021   Procedure: BIOPSY;  Surgeon: Wilhelmenia Aloha Raddle., MD;  Location: [facility] ENDOSCOPY;  Service: Gastroenterology;;   CATARACT EXTRACTION W/ INTRAOCULAR LENS  IMPLANT, BILATERAL     COLON SURGERY     COLONOSCOPY N/A 08/04/2015    Procedure: COLONOSCOPY;  Surgeon: Gordy CHRISTELLA Starch, MD;  Location: [facility] ENDOSCOPY;  Service: Endoscopy;  Laterality: N/A;   COLONOSCOPY  2017   JMP@ [facility]-good prep-mass -recall 1 yr   COLONOSCOPY N/A 09/13/2021   Procedure: COLONOSCOPY;  Surgeon: Mansouraty, Aloha Raddle., MD;  Location: [facility] ENDOSCOPY;  Service: Gastroenterology;  Laterality: N/A;   COLONOSCOPY WITH PROPOFOL  N/A 11/25/2020   Procedure: COLONOSCOPY WITH PROPOFOL ;  Surgeon: Federico Rosario BROCKS, MD;  Location: [facility] ENDOSCOPY;  Service: Gastroenterology;  Laterality: N/A;   COLONOSCOPY WITH PROPOFOL  N/A 09/23/2021   Procedure: COLONOSCOPY WITH PROPOFOL ;  Surgeon: San Sandor GAILS, DO;  Location: [facility] ENDOSCOPY;  Service: Gastroenterology;  Laterality: N/A;   ENTEROSCOPY N/A 09/23/2021   Procedure: ENTEROSCOPY;  Surgeon: San Sandor GAILS, DO;  Location: [facility] ENDOSCOPY;  Service: Gastroenterology;  Laterality: N/A;  Will place order for video capsule study as we may opt to place it during procedure   ESOPHAGOGASTRODUODENOSCOPY N/A 08/01/2019   Procedure: ESOPHAGOGASTRODUODENOSCOPY (EGD);  Surgeon: Kristie Lamprey, MD;  Location: [facility] ENDOSCOPY;  Service: Endoscopy;  Laterality: N/A;  ESOPHAGOGASTRODUODENOSCOPY N/A 11/18/2020   Procedure: ESOPHAGOGASTRODUODENOSCOPY (EGD);  Surgeon: Stacia Glendia BRAVO, MD;  Location: [facility] ENDOSCOPY;  Service: Gastroenterology;  Laterality: N/A;   ESOPHAGOGASTRODUODENOSCOPY (EGD) WITH PROPOFOL  N/A 08/04/2019   Procedure: ESOPHAGOGASTRODUODENOSCOPY (EGD) WITH PROPOFOL ;  Surgeon: Leigh Elspeth SQUIBB, MD;  Location: [facility] ENDOSCOPY;  Service: Gastroenterology;  Laterality: N/A;   ESOPHAGOGASTRODUODENOSCOPY (EGD) WITH PROPOFOL  N/A 09/13/2021   Procedure: ESOPHAGOGASTRODUODENOSCOPY (EGD) WITH PROPOFOL ;  Surgeon: Wilhelmenia Aloha Raddle., MD;  Location: [facility] ENDOSCOPY;  Service: Gastroenterology;  Laterality: N/A;   GIVENS CAPSULE STUDY N/A 09/23/2021   Procedure: GIVENS CAPSULE STUDY;  Surgeon: San Sandor GAILS, DO;  Location: [facility] ENDOSCOPY;   Service: Gastroenterology;  Laterality: N/A;   graft left arm Left    for dialysis x 2. Removed   HARDWARE REMOVAL Right 02/28/2024   Procedure: RIGHT SHOULDER ARTHROSCOPIC WASHOUT AND PLACEMENT OF ANTIBIOTIC BEADS;  Surgeon: [name] Cordella Glendia, MD;  Location: [facility] OR;  Service: Orthopedics;  Laterality: Right;  RIGHT SHOULDER HARDWARE REMOVAL, PLACEMENT OF ANTIBIOTIC SPACER   HOT HEMOSTASIS  11/18/2020   Procedure: HOT HEMOSTASIS (ARGON PLASMA COAGULATION/BICAP);  Surgeon: Stacia Glendia BRAVO, MD;  Location: [facility] ENDOSCOPY;  Service: Gastroenterology;;   HOT HEMOSTASIS N/A 09/23/2021   Procedure: HOT HEMOSTASIS (ARGON PLASMA COAGULATION/BICAP);  Surgeon: San Sandor GAILS, DO;  Location: [facility] ENDOSCOPY;  Service: Gastroenterology;  Laterality: N/A;   INSERTION OF DIALYSIS CATHETER     Rt chest   IR US  GUIDE BX ASP/DRAIN  02/25/2024   KNEE ARTHROSCOPY Right 02/21/2024   Procedure: ARTHROSCOPY, KNEE;  Surgeon: [name] Kay HERO, MD;  Location: [facility] OR;  Service: Orthopedics;  Laterality: Right;   LAPAROSCOPIC RIGHT COLECTOMY N/A 08/05/2015   Procedure: LAPAROSCOPIC RIGHT COLECTOMY- ASCENDING;  Surgeon: Jina Nephew, MD;  Location: [facility] OR;  Service: General;  Laterality: N/A;   LOWER EXTREMITY ANGIOGRAPHY N/A 12/09/2023   Procedure: Lower Extremity Angiography;  Surgeon: Sheree Penne Bruckner, MD;  Location: [facility] INVASIVE CV LAB;  Service: Cardiovascular;  Laterality: N/A;   LOWER EXTREMITY INTERVENTION N/A 12/09/2023   Procedure: LOWER EXTREMITY INTERVENTION;  Surgeon: Sheree Penne Bruckner, MD;  Location: [facility] INVASIVE CV LAB;  Service: Cardiovascular;  Laterality: N/A;   MASS EXCISION Left 05/28/2019   Procedure: EXCISION SOFT TISSUE MASS LEFT SHOULDER;  Surgeon: Eletha Boas, MD;  Location: [facility] ORS;  Service: General;  Laterality: Left;   NEPHRECTOMY Bilateral    PARATHYROIDECTOMY N/A 06/12/2016   Procedure: TOTAL PARATHYROIDECTOMY WITH AUTOTRANSPLANTATION TO LEFT FOREARM;  Surgeon: Boas Eletha, MD;   Location: [facility] OR;  Service: General;  Laterality: N/A;   PARATHYROIDECTOMY N/A 10/23/2016   Procedure: PARATHYROIDECTOMY;  Surgeon: Eletha Boas, MD;  Location: [facility] OR;  Service: General;  Laterality: N/A;   REVERSE SHOULDER ARTHROPLASTY Right 08/24/2020   Procedure: REVERSE SHOULDER ARTHROPLASTY;  Surgeon: Cristy Bonner DASEN, MD;  Location: [facility] OR;  Service: Orthopedics;  Laterality: Right;   REVISON OF ARTERIOVENOUS FISTULA Right 07/16/2017   Procedure: REVISION OF ARTERIOVENOUS FISTULA  Right ARM;  Surgeon: Sheree Penne Bruckner, MD;  Location: [facility] OR;  Service: Vascular;  Laterality: Right;   TOTAL HIP ARTHROPLASTY Left 11/14/2020   Procedure: LEFT TOTAL HIP ARTHROPLASTY ANTERIOR APPROACH;  Surgeon: [name] Kay HERO, MD;  Location: [facility] OR;  Service: Orthopedics;  Laterality: Left;   UPPER GASTROINTESTINAL ENDOSCOPY  2021   @ [facility]    Family History  Problem Relation Age of Onset   Diabetes Father    Stroke Father    Hypertension Father    Uterine cancer Mother    Lupus  Sister    Stroke Sister    Hypertension Sister    Anuerysm Brother        brain   Colon cancer Neg Hx    Esophageal cancer Neg Hx    Stomach cancer Neg Hx    Pancreatic cancer Neg Hx    Liver disease Neg Hx    Colon polyps Neg Hx    Rectal cancer Neg Hx     Social History:  Single--wife passed away in  2022-03-18. Lives with son and now in ground floor apartment. He  reports that he quit smoking about 33 years ago. His smoking use included cigarettes. He has never used smokeless tobacco. He reports that he does not currently use alcohol after a past usage of about 1.0 standard drink of alcohol per week. He reports that he does not currently use drugs after having used the following drugs: Marijuana.   Allergies: Allergies[1]   Medications Prior to Admission  Medication Sig Dispense Refill   acetaminophen  (TYLENOL ) 650 MG CR tablet Take 1,300 mg by mouth 2 (two) times daily.     amoxicillin  (AMOXIL ) 500 MG capsule TAKE FOUR  CAPSULES BY MOUTH ONE HOUR BEFORE DENTAL WORK/PROCEDURE 8 capsule 0   Aspirin -Caffeine  (BAYER BACK & BODY PAIN EX ST PO) Take 2 tablets by mouth 2 (two) times daily as needed (pain).     augmented betamethasone  dipropionate (DIPROLENE -AF) 0.05 % ointment Apply 1 Application topically 2 (two) times daily as needed for rash.     CALCITRIOL  PO Take 2 mcg by mouth See admin instructions. Medication provided and administered by [facility] dialysis clinic. Give 2mcg by mouth 3 times per week week with every dialysis treatment.     clopidogrel  (PLAVIX ) 75 MG tablet Take 1 tablet (75 mg total) by mouth daily. 30 tablet 11   diphenhydrAMINE  (BENADRYL ) 25 mg capsule Take 1 capsule (25 mg total) by mouth every 6 (six) hours as needed for itching.     gabapentin  (NEURONTIN ) 100 MG capsule Take 3 capsules (300 mg total) by mouth at bedtime. 30 capsule 2   heparin  sodium, porcine, 1000 UNIT/ML injection Inject 3,000 Units into the vein See admin instructions. Medication provided and administered by [facility] dialysis clinic. Bolus; IV push 3000u with every dialysis treatment 3 times per week (total treatment 240 minutes).     ibuprofen (ADVIL) 800 MG tablet Take 800 mg by mouth 2 (two) times daily as needed (pain).     lidocaine  (LIDODERM ) 5 % Place 1 patch onto the skin daily. Remove & Discard patch within 12 hours 30 patch 0   LOKELMA  10 g PACK packet Take 1 packet by mouth daily.     midodrine  (PROAMATINE ) 10 MG tablet Take 1 tablet (10 mg total) by mouth every dialysis (30 min before each dialysis.). 15 tablet 5   Misc Natural Products (TURMERIC, CURCUMIN, PO) Take 1 tablet by mouth daily.     multivitamin (RENA-VIT) TABS tablet Take 1 tablet by mouth daily.     [EXPIRED] ofloxacin  (FLOXIN ) 0.3 % OTIC solution Place 10 drops into the left ear daily for 7 days. 10 mL 0   Psyllium (METAMUCIL PO) Take 1 Scoop by mouth daily as needed (GI issues).     sevelamer  carbonate (RENVELA ) 800 MG tablet Take 3,200 mg by  mouth 3 (three) times daily with meals.     amoxicillin -clavulanate (AUGMENTIN ) 875-125 MG tablet Take 1 tablet by mouth 2 (two) times daily. 10 day course. (Patient not taking: Reported on 02/11/2024)  atorvastatin  (LIPITOR) 10 MG tablet Take 1 tablet (10 mg total) by mouth every Thursday. (Patient not taking: Reported on 02/11/2024) 30 tablet 2   cyclobenzaprine  (FLEXERIL ) 10 MG tablet Take 1 tablet (10 mg total) by mouth 3 (three) times daily as needed for muscle spasms. (Patient not taking: Reported on 02/11/2024) 30 tablet 0      Home: Home Living Family/patient expects to be discharged to:: Private residence Living Arrangements: Children Available Help at Discharge: Family, Available 24 hours/day Type of Home: Apartment Home Access: Level entry Home Layout: One level Bathroom Shower/Tub: Engineer, Manufacturing Systems: Standard (+ toilet riser) Bathroom Accessibility: Yes Home Equipment: Agricultural Consultant (2 wheels), Grab bars - tub/shower, Government social research officer, Wheelchair - manual  Lives With: Son   Functional History: Prior Function Prior Level of Function : Independent/Modified Independent Mobility Comments: Mod I using RW; x3 falls in past month due to R knee buckling ADLs Comments: Mod I ADLs, was driving but difficulty driving lately due to R leg not functioning properly  Functional Status:  Mobility: Bed Mobility Overal bed mobility: Needs Assistance Bed Mobility: Supine to Sit, Sit to Supine Supine to sit: Min assist Sit to supine: Min assist General bed mobility comments: assistance with RLE Transfers Overall transfer level: Needs assistance Equipment used: Rolling walker (2 wheels), None Transfers: Sit to/from Stand Sit to Stand: Mod assist, Max assist Bed to/from chair/wheelchair/BSC transfer type:: Via Lift equipment Squat pivot transfers: Max assist, +2 physical assistance, +2 safety/equipment, From elevated surface Step pivot transfers: Mod assist, +2 physical  assistance, +2 safety/equipment, From elevated surface  Lateral/Scoot Transfers: Max assist Transfer via Lift Equipment: Stedy General transfer comment: attempted 3x from EOB, Max A with RW with difficutly getting the RUE up onto the walker. Mod A with face to face sit to stand with pt holding onto therapist arm with R hand and pushing up from EOB with L hand for 2 attempts. Pt was unable to get fully to standing for any trial. Ambulation/Gait Ambulation/Gait assistance: Mod assist, +2 physical assistance Gait Distance (Feet): 0 Feet Assistive device: Rolling walker (2 wheels) Gait Pattern/deviations: Step-to pattern, Decreased step length - right, Decreased step length - left, Decreased stride length, Knees buckling, Trunk flexed General Gait Details: unable at this time. Gait velocity: decreased Gait velocity interpretation: <1.31 ft/sec, indicative of household ambulator Pre-gait activities: Pt took small steps from EOB to recliner with R knee blocked throughout; slight improved with incerased time up but continued to require knee block Mod A +2 for wgt shifting, stepping and balance/LE strength.    ADL: ADL Overall ADL's : Needs assistance/impaired Eating/Feeding: Set up, Bed level Eating/Feeding Details (indicate cue type and reason): d/t impaired FMC in dominant RUE Grooming: Wash/dry hands, Wash/dry face, Oral care, Set up, Supervision/safety, Sitting Grooming Details (indicate cue type and reason): on EOB Upper Body Bathing: Sitting, Minimal assistance Lower Body Bathing: Maximal assistance, Sit to/from stand, +2 for safety/equipment, +2 for physical assistance Upper Body Dressing : Minimal assistance, Sitting Lower Body Dressing: Maximal assistance, +2 for physical assistance, +2 for safety/equipment Toilet Transfer: Moderate assistance, +2 for physical assistance, BSC/3in1, Regular Toilet [employer]) Toilet Transfer Details (indicate cue type and reason): transfer to regular toilet  with 3n1 over commode with Stedy and mod assists +2 to stand Toileting- Clothing Manipulation and Hygiene: Moderate assistance Toileting - Clothing Manipulation Details (indicate cue type and reason): mod to max assist for clothing management and patient able to perform toilet hygiene seated Functional mobility during ADLs: +2 for  safety/equipment, +2 for physical assistance, Moderate assistance, Maximal assistance (sit>stands only with RW) General ADL Comments: ADL participation impacted by pain  Cognition: Cognition Overall Cognitive Status: History of cognitive impairments - at baseline Arousal/Alertness: Awake/alert Orientation Level: Oriented X4 Attention: Sustained Sustained Attention: Impaired Sustained Attention Impairment: Verbal basic Memory: Impaired Memory Impairment: Decreased short term memory, Decreased recall of new information Decreased Short Term Memory: Verbal basic Awareness: Appears intact Problem Solving: Appears intact Safety/Judgment: Appears intact Cognition Arousal: Alert Behavior During Therapy: WFL for tasks assessed/performed Overall Cognitive Status: History of cognitive impairments - at baseline  Physical Exam: Blood pressure 113/83, pulse 99, temperature 98.1 F (36.7 C), resp. rate 18, height 6' 1 (1.854 m), weight 85.2 kg, SpO2 100%. Physical Exam Vitals and nursing note reviewed.  Constitutional:      General: He is not in acute distress.    Appearance: Normal appearance. He is obese.  HENT:     Head: Normocephalic.     Right Ear: External ear normal.     Left Ear: External ear normal.     Nose: Nose normal.     Mouth/Throat:     Mouth: Mucous membranes are moist.  Eyes:     Conjunctiva/sclera: Conjunctivae normal.  Cardiovascular:     Rate and Rhythm: Normal rate and regular rhythm.     Heart sounds: No murmur heard.    No gallop.  Pulmonary:     Effort: Pulmonary effort is normal. No respiratory distress.     Breath sounds: Normal  breath sounds. No wheezing.  Abdominal:     General: Bowel sounds are normal. There is no distension.     Tenderness: There is no abdominal tenderness.  Musculoskeletal:     Cervical back: Normal range of motion.     Comments: Dry dressing with opsite on right shoulder. Right knee with Kerlix and ace wrap (surgical dressing). Both areas are still swollen andtender to palpation and with attempts at PROM  Skin:    General: Skin is warm and dry.     Comments: Bilateral lower legs and feet with dry flaky skin.   Neurological:     Mental Status: He is alert and oriented to person, place, and time.     Comments: Alert and oriented x 3. Normal insight and awareness. Intact Memory. Normal language and speech. Cranial nerve exam unremarkable. MMT: LUE grossly 4+ to 5/5. RUE limited by pain in right shoulder. Does have hat least 4/5 elbow and 4+ to 5/5 at hand and wrist. RLE limited proximally due to knee pain. Right ADF/PF is 4+/5. LLE 3-4/5 prox to 5/5 distally. Decreased LT in both feet. No abnl resting tone. SABRA    Psychiatric:        Mood and Affect: Mood normal.        Thought Content: Thought content normal.     Results for orders placed or performed during the hospital encounter of 02/08/24 (from the past 48 hours)  Glucose, capillary     Status: None   Collection Time: 03/01/24 11:52 AM  Result Value Ref Range   Glucose-Capillary 91 70 - 99 mg/dL    Comment: Glucose reference range applies only to samples taken after fasting for at least 8 hours.  Glucose, capillary     Status: None   Collection Time: 03/01/24  4:00 PM  Result Value Ref Range   Glucose-Capillary 85 70 - 99 mg/dL    Comment: Glucose reference range applies only to samples taken after fasting for  at least 8 hours.  Glucose, capillary     Status: None   Collection Time: 03/01/24 11:19 PM  Result Value Ref Range   Glucose-Capillary 96 70 - 99 mg/dL    Comment: Glucose reference range applies only to samples taken after  fasting for at least 8 hours.  Renal function panel     Status: Abnormal   Collection Time: 03/02/24  4:20 AM  Result Value Ref Range   Sodium 132 (L) 135 - 145 mmol/L   Potassium 5.2 (H) 3.5 - 5.1 mmol/L   Chloride 91 (L) 98 - 111 mmol/L   CO2 25 22 - 32 mmol/L   Glucose, Bld 86 70 - 99 mg/dL    Comment: Glucose reference range applies only to samples taken after fasting for at least 8 hours.   BUN 66 (H) 8 - 23 mg/dL   Creatinine, Ser 1.65 (H) 0.61 - 1.24 mg/dL   Calcium  9.4 8.9 - 10.3 mg/dL   Phosphorus 4.1 2.5 - 4.6 mg/dL   Albumin  2.9 (L) 3.5 - 5.0 g/dL   GFR, Estimated 7 (L) >60 mL/min    Comment: (NOTE) Calculated using the CKD-EPI Creatinine Equation (2021)    Anion gap 16 (H) 5 - 15    Comment: Performed at [facility], [address], [place], [name]  Vancomycin , random     Status: None   Collection Time: 03/02/24  4:20 AM  Result Value Ref Range   Vancomycin  Rm 20 ug/mL    Comment: Performed at [facility], [address] [place], [name]  Glucose, capillary     Status: None   Collection Time: 03/02/24 12:21 PM  Result Value Ref Range   Glucose-Capillary 81 70 - 99 mg/dL    Comment: Glucose reference range applies only to samples taken after fasting for at least 8 hours.  Glucose, capillary     Status: Abnormal   Collection Time: 03/02/24  4:15 PM  Result Value Ref Range   Glucose-Capillary 126 (H) 70 - 99 mg/dL    Comment: Glucose reference range applies only to samples taken after fasting for at least 8 hours.  Glucose, capillary     Status: None   Collection Time: 03/02/24  7:42 PM  Result Value Ref Range   Glucose-Capillary 91 70 - 99 mg/dL    Comment: Glucose reference range applies only to samples taken after fasting for at least 8 hours.  Renal function panel     Status: Abnormal   Collection Time: 03/03/24  1:49 AM  Result Value Ref Range   Sodium 132 (L) 135 - 145 mmol/L   Potassium 4.5 3.5 - 5.1 mmol/L   Chloride  90 (L) 98 - 111 mmol/L   CO2 29 22 - 32 mmol/L   Glucose, Bld 92 70 - 99 mg/dL    Comment: Glucose reference range applies only to samples taken after fasting for at least 8 hours.   BUN 38 (H) 8 - 23 mg/dL   Creatinine, Ser 4.12 (H) 0.61 - 1.24 mg/dL   Calcium  9.4 8.9 - 10.3 mg/dL   Phosphorus 3.0 2.5 - 4.6 mg/dL   Albumin  2.9 (L) 3.5 - 5.0 g/dL   GFR, Estimated 10 (L) >60 mL/min    Comment: (NOTE) Calculated using the CKD-EPI Creatinine Equation (2021)    Anion gap 13 5 - 15    Comment: Performed at [facility], [address] [place], [name]  Glucose,  capillary     Status: None   Collection Time: 03/03/24  7:30 AM  Result Value Ref Range   Glucose-Capillary 83 70 - 99 mg/dL    Comment: Glucose reference range applies only to samples taken after fasting for at least 8 hours.   No results found.    Blood pressure 113/83, pulse 99, temperature 98.1 F (36.7 C), resp. rate 18, height 6' 1 (1.854 m), weight 85.2 kg, SpO2 100%.  Medical Problem List and Plan: 1. Functional deficits secondary to septic right knee and shoulder  -patient may shower if incisons are covered  -ELOS/Goals: 15-20 days, min assist goals with PT and OT, W/C level? 2.  Antithrombotics:  -DVT/anticoagulation:  Pharmaceutical: Heparin   -antiplatelet therapy: ASA/Plavix  3. Pain Management: oxycodone  prn.  -will schedule him oxycodone  in AM and PM prior to activities 4. Mood/Behavior/Sleep:  LCSW to follow for evaluation and support.   -antipsychotic agents: N/A  --Melatonin prn for insomnia 5. Neuropsych/cognition: This patient is capable of making decisions on his own behalf. 6. Skin/Wound Care: Surgical dressing on right shoulder and knee--will contact surgeon regarding dressing change 7. Fluids/Electrolytes/Nutrition: Strict I/O. Renal diet w/1200 cc/FR.  8.  Septic arthritis R-knee/R-shoulder: To continue Cefepime  and Vanc with HD thru 04/10/24  --shoulder to be aspirated in 8 weeks  per Dr. [name]? 9.  Chronic hypotension: On midodrine  10  mg TID with prn doses  10. ESRD: HD MWF at the end of the day to help with tolerance of therapy.   --chronic hyponatremia managed with HD/FR 11. T2DM: A1C- 5.1. Will continue to monitor BS ac/hs and if controlled decrease to twice a day 12. Anemia of chronic disease: Jehovah's witness and refuses blood transfusion 13. Otitis media with TM perforation: Has completed antibiotic course.  --Follow up with [name] for evaluation.  14. Cervical myelopathy: Gabapentin  for neuropathy 15. PAD s/p R-SFA/popliteal lithotripsy/Stent: On ASA, Plavix  and Lipitor.    Carl GORMAN Schmitz, PA-C 03/03/2024     [1]  Allergies Allergen Reactions   Infed [Iron Dextran] Other (See Comments)    Decreased BP    Vicodin [Hydrocodone -Acetaminophen ] Other (See Comments)    Decreased BP   Hydrocodone  Other (See Comments)    Unable to recall reaction   Voltaren [Diclofenac] Other (See Comments)    Hypotension    "

## 2024-03-03 NOTE — Plan of Care (Signed)
"        Wound Plan   Braden Score: 16 Sensory: 3 Moisture: 4 Activity: 2 Mobility: 3 Nutrition: 3 Friction: 1  Wounds present: Wound on sacrum; unstageable wound; santryl ordered  Interventions:  AIR mattress bed WOC consult Dietician consult Prevalon boots bilateral Prosource BID RenaVit daily [employer] nutritional supplement daily Renal Diet; 1200 Cc FR  Folic Acid  daily Ferrous Sulfate  daily Psyllium pack daily  Eucerin cream BID to feet  Contributors: Harlene Schimke, BSN, CRRN, WTA, CBIS Heather Bullins RN, WOC  "

## 2024-03-03 NOTE — Discharge Summary (Signed)
 " Physician Discharge Summary   Patient: Carl Tomb Sr. MRN: [id number] DOB: [birth year]  Admit date:     02/08/2024  Discharge date: 03/03/24  Discharge Physician: Sabas GORMAN Brod   PCP: [name], MD   Recommendations at discharge:   Patient will be discharged to inpatient rehab Continue vancomycin  and cefepime  for 5 weeks through 04/10/2024 Follow-up ENT, Penne Croak as outpatient after discharge for aural toilet in the office and updated hearing test  Discharge Diagnoses: Principal Problem:   Septic arthritis of knee, right (HCC) Active Problems:   GERD (gastroesophageal reflux disease)   HLD (hyperlipidemia)   Essential hypertension   Diarrhea, unspecified   ESRD (end stage renal disease) (HCC)   Obstructive sleep apnea   Type 2 diabetes mellitus with diabetic peripheral angiopathy without gangrene (HCC)   Sepsis (HCC)   Acute suppurative otitis media of left ear with spontaneous rupture of tympanic membrane   Mastoid disorder, bilateral   Conductive hearing loss   Acute right-sided weakness   Arthritis, septic, knee (HCC)   Acute pain of right shoulder   Abscess   Acute bacterial endocarditis   Infection of prosthetic shoulder joint  Resolved Problems:   * No resolved hospital problems. *  Hospital Course: 64 year old with past medical history significant for ESRD on hemodialysis MWF, PVD s/p angioplasty right S FA October 2025 for ischemic rest pain as well as C4 spinal fracture April 2025 with incomplete spinal cord injury requiring C4 corpectomy, fusion and interbody cage by Dr. [name], wheelchair-bound for a time but progressed by Agurs to walking with a cane and driving, presented with right arm and leg weakness, fall as well as posterior neck pain, fever, ear drainage.   Admitted started on antibiotics, neurosurgery, neurology orthopedic ENT and nephrology consulted.   Diagnosed with otitis media and ruptured eardrum.  Right-sided weakness believed to be  recrudescence of old spinal cord injury symptoms in the setting of infection.   He underwent Arthrocentesis of right knee, synovial fluid consistent with septic arthritis, underwent irrigation and debridement by Dr. [name] on 1/9. ID consulted recommend 4 weeks of IV antibiotics with HD> Vancomycin  and cefepime . End date 03/23/2024.   Spike fever 1/12 overnight. ID will follow up on patient today. ID also recommend Ortho evaluation for fluid collection of Right Shoulder. Ortho will consult IR for aspiration.   Assessment and plan  Sepsis due to otitis media with suspected TM perforation, right knee septic arthritis - Met sepsis criteria on admission; sepsis physiology has resolved - Seen by ENT, underwent CT of temporal bone without evidence of coalescent mastoiditis, bone lesion or abscess - Completed 10 days of antibiotics for otitis media  -needs follow up with ENT on January 5 -- 12th. I sent message through secure chat to Dr Croak.  - Treated with  Ciprodex  drops, ENT recommend it for 14 days. Completed.  -Seen by ENT Dr. Penne Croak, recommend to follow-up with him after discharge. -He anticipates aural toilet in the office with microscope and updated hearing test.     Right-sided weakness History of cervical decompression corpectomy C4 by [name] April 2025 Remote left occipital infarct - There was some concern for recurrent spinal cord injury, neurosurgery and neurology were consulted.   -A spinal MRI showed no new cord injury. - It was thought that his symptoms were recrudescence of old  symptoms due to  acute infection.    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 Had knee aspiration and injection on  1/06  -knee synovial fluid, no growth to date.  -WBC Synovial fluid notice to be 71,000.  -Treated with Augmentin  and Linezolid .  -ID  consulted , recommend IV antibiotics with HD for 4 weeks.  Currently on vancomycin  and cefepime ,  Monday Wednesday and Friday with hemodialysis.  As per ID, continue current dose of vancomycin  and cefepime  with hemodialysis through 04/10/24 Ortho consulted for evaluation of fluid collection right shoulder in setting of persistent fever.  -IR was consulted and small amount of fluid in soft tissue anterior and posterior to right shoulder aspirated. - Fluid culture showed no growth; WBC count 93,000 -Seen by Dr. [name], underwent arthroscopic I&D with placement of antibiotic beads and drain in right shoulder -Seen by Ortho today, plan to keep sling for the weekend, okay to discontinue sling on Monday and begin functional activities in the right arm. As per Ortho, Although not an ideal treatment for this particular problem the plan at this time would be to finish out a course of IV and p.o. antibiotics per infectious disease and reaspirate the shoulder in approximately 8 weeks    -Peripheral vascular disease s/p angioplasty October 2025; - Continue aspirin  Lipitor and Plavix    ESRD on hemodialysis: MWF - Nephrology following.    Continue with HD.    Chronic hypotension, asymptomatic -Continue midodrine  Refuse meds at times.  ECHO normal EF. 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  Per cardiology small mobile echodensity on the ventricular aspect of the aortic valve is likely degenerative changes of the valve.  Also blood cultures has been negative. -spike fever, repeat blood cultures.  -ID informed of echo finding.  -No plan to do TEE as this would not change management as per ID.  He would not be candidate for CT surgery and plan to get 6 weeks of vancomycin  and cefepime .   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 He would not accept blood transfusion do to religious believes.  Hemoglobin is stable at 8.2      Consultants: Orthopedics, ENT Procedures performed: Disposition: Rehabilitation facility Diet recommendation:  Regular diet DISCHARGE MEDICATION: Allergies as of 03/03/2024       Reactions   Infed [iron Dextran] Other (See Comments)   Decreased BP    Vicodin [hydrocodone -acetaminophen ] Other (See Comments)   Decreased BP   Hydrocodone  Other (See Comments)   Unable to recall reaction   Voltaren [diclofenac] Other (See Comments)   Hypotension         Medication List     STOP taking these medications    amoxicillin -clavulanate 875-125 MG tablet Commonly known as: AUGMENTIN    augmented betamethasone  dipropionate 0.05 % ointment Commonly known as: DIPROLENE -AF   BAYER BACK & BODY PAIN EX ST PO   CALCITRIOL  PO   ibuprofen 800 MG tablet Commonly known as: ADVIL   Lokelma  10 g Pack packet Generic drug: sodium zirconium cyclosilicate    ofloxacin  0.3 % OTIC solution Commonly known as: FLOXIN        TAKE these medications    acetaminophen  650 MG CR tablet Commonly known as: TYLENOL  Take 1,300 mg by mouth 2 (two) times daily. What changed: Another medication with the same name was  added. Make sure you understand how and when to take each.   acetaminophen  325 MG tablet Commonly known as: TYLENOL  Take 2 tablets (650 mg total) by mouth every 6 (six) hours as needed for mild pain (pain score 1-3) or fever (or Fever >/= 101). What changed: You were already taking a medication with the same name, and this prescription was added. Make sure you understand how and when to take each.   amoxicillin  500 MG capsule Commonly known as: AMOXIL  TAKE FOUR CAPSULES BY MOUTH ONE HOUR BEFORE DENTAL WORK/PROCEDURE   aspirin  EC 81 MG tablet Take 1 tablet (81 mg total) by mouth daily. Swallow whole. Start taking on: March 04, 2024    atorvastatin  10 MG tablet Commonly known as: LIPITOR Take 1 tablet (10 mg total) by mouth every Thursday.   ceFEPime  IVPB Commonly known as: MAXIPIME  Inject 2 g into the vein every Monday, Wednesday, and Friday with hemodialysis. Indication:  Right knee septic arthritis  Last Day of Therapy:  04/10/24 Labs - Once weekly:  CBC/D and BMP, Labs - Once weekly: ESR and CRP   cinacalcet  30 MG tablet Commonly known as: SENSIPAR  Take 4 tablets (120 mg total) by mouth daily with breakfast. Start taking on: March 04, 2024 What changed:  medication strength See the new instructions.   clopidogrel  75 MG tablet Commonly known as: Plavix  Take 1 tablet (75 mg total) by mouth daily.   cyclobenzaprine  10 MG tablet Commonly known as: FLEXERIL  Take 1 tablet (10 mg total) by mouth 3 (three) times daily as needed for muscle spasms.   Darbepoetin Alfa  100 MCG/0.5ML Sosy injection Commonly known as: ARANESP  Inject 0.5 mLs (100 mcg total) into the skin every Monday at 6 PM. Start taking on: March 09, 2024   diphenhydrAMINE  25 mg capsule Commonly known as: BENADRYL  Take 1 capsule (25 mg total) by mouth every 6 (six) hours as needed for itching.   docusate sodium  100 MG capsule Commonly known as: COLACE Take 1 capsule (100 mg total) by mouth 2 (two) times daily.   feeding supplement (KATE FARMS STANDARD 1.4) Liqd liquid Take 325 mLs by mouth daily.   (feeding supplement) PROSource Plus liquid Take 30 mLs by mouth 2 (two) times daily between meals.   ferrous sulfate  325 (65 FE) MG tablet Take 1 tablet (325 mg total) by mouth daily with breakfast. Start taking on: March 04, 2024   folic acid  1 MG tablet Commonly known as: FOLVITE  Take 1 tablet (1 mg total) by mouth daily. Start taking on: March 04, 2024   gabapentin  100 MG capsule Commonly known as: NEURONTIN  Take 3 capsules (300 mg total) by mouth at bedtime.   heparin  sodium (porcine) 1000 UNIT/ML injection Inject 3,000  Units into the vein See admin instructions. Medication provided and administered by [facility]. Bolus; IV push 3000u with every dialysis treatment 3 times per week (total treatment 240 minutes).   insulin  aspart 100 UNIT/ML injection Commonly known as: novoLOG  Inject 0-6 Units into the skin 3 (three) times daily with meals.   lidocaine  5 % Commonly known as: Lidoderm  Place 1 patch onto the skin daily. Remove & Discard patch within 12 hours   melatonin 5 MG Tabs Take 1 tablet (5 mg total) by mouth at bedtime as needed.   METAMUCIL PO Take 1 Scoop by mouth daily as needed (GI issues).   midodrine  10 MG tablet Commonly known as: PROAMATINE  Take 1 tablet (10 mg total) by mouth 3 (three) times daily with  meals. What changed:  when to take this reasons to take this   multivitamin Tabs tablet Take 1 tablet by mouth daily.   ondansetron  4 MG tablet Commonly known as: ZOFRAN  Take 1 tablet (4 mg total) by mouth every 6 (six) hours as needed for nausea.   oxyCODONE -acetaminophen  5-325 MG tablet Commonly known as: PERCOCET/ROXICET Take 1 tablet by mouth every 4 (four) hours as needed for severe pain (pain score 7-10) or moderate pain (pain score 4-6).   pantoprazole  40 MG tablet Commonly known as: PROTONIX  Take 1 tablet (40 mg total) by mouth 2 (two) times daily before a meal. What changed:  when to take this reasons to take this   sevelamer  carbonate 800 MG tablet Commonly known as: RENVELA  Take 3,200 mg by mouth 3 (three) times daily with meals.   TURMERIC (CURCUMIN) PO Take 1 tablet by mouth daily.   vancomycin  IVPB Inject 1,000 mg into the vein every Monday, Wednesday, and Friday with hemodialysis. Indication:  Right knee septic arthritis  Last Day of Therapy:  04/10/24  Labs - pnce weekly :  CBC/D, BMP, ESR, CRP and vancomycin  trough.               Home Infusion Instuctions  (From admission, onward)           Start     Ordered   02/24/24  0000  Home infusion instructions       Question:  Instructions  Answer:  Flushing of vascular access device: 0.9% NaCl pre/post medication administration and prn patency; Heparin  100 u/ml, 5ml for implanted ports and Heparin  10u/ml, 5ml for all other central venous catheters.   02/24/24 1121            Follow-up Information     Jule Ronal CROME, PA-C. Schedule an appointment as soon as possible for a visit in 2 week(s).   Specialty: Orthopedic Surgery Contact information: [address] [place]  [name] [phone]                Discharge Exam: Fredricka Weights   03/02/24 0500 03/02/24 0742 03/02/24 1127  Weight: 91.5 kg 87.2 kg 85.2 kg   General-appears in no acute distress Heart-S1-S2, regular, no murmur auscultated Lungs-clear to auscultation bilaterally, no wheezing or crackles auscultated Abdomen-soft, nontender, no organomegaly Extremities-no edema in the lower extremities Neuro-alert, oriented x3, no focal deficit noted  Condition at discharge: good  The results of significant diagnostics from this hospitalization (including imaging, microbiology, ancillary and laboratory) are listed below for reference.   Imaging Studies: IR ARTHRO ASP OR INJ MAJOR JOINT OR BURSA Result Date: 02/25/2024 INDICATION: 64 year old with a septic right knee and right shoulder symptoms. Previous MRI demonstrated a fluid collection along the posterior soft tissues of the right shoulder. Patient is status post right shoulder arthroplasty. EXAM: Ultrasound-guided aspiration of fluid collections around the right shoulder MEDICATIONS: 1% lidocaine  for local anesthetic ANESTHESIA/SEDATION: None COMPLICATIONS: None immediate. PROCEDURE: Informed written consent was obtained from the patient after a thorough discussion of the procedural risks, benefits and alternatives. All questions were addressed. A timeout was performed prior to the initiation of the procedure. Right shoulder was  evaluated with ultrasound. Small amount of complex fluid in the deep soft tissues along the posterior and anterior aspect of the right shoulder was identified with ultrasound. The posterior collection appeared to correspond with the MRI from 02/01/2024. Anterior and posterior aspect of the right shoulder was prepped with chlorhexidine  and sterile field was created. Skin was anesthetized  with 1% lidocaine . Using ultrasound guidance, a 22 gauge needle was directed into the posterior collection and a small amount of brownish fluid was aspirated. Using ultrasound guidance, an 18 gauge needle was directed into the anterior right shoulder and a small amount of yellow fluid was aspirated. The fluid was sent for culture, crystals and cell count. FINDINGS: Complex hypoechoic fluid along the posterior right shoulder corresponding with the previous MRI findings. 1 mL brown fluid was aspirated from this area. Small amount of fluid and edematous tissue along the anterior right shoulder. 1 mL of yellow fluid was aspirated from the anterior right shoulder. IMPRESSION: Ultrasound-guided aspiration of a small amount of fluid from the anterior and posterior right shoulder soft tissues. Electronically Signed   By: Juliene Balder M.D.   On: 02/25/2024 16:56   ECHOCARDIOGRAM COMPLETE Result Date: 02/20/2024    ECHOCARDIOGRAM REPORT   Patient Name:   Carl Barlowe Sr. Date of Exam: 02/20/2024 Medical Rec #:  [id number]           Height:       73.0 in Accession #:    [id number]          Weight:       188.9 lb Date of Birth:  [birth year]            BSA:          2.100 m Patient Age:    63 years            BP:           86/58 mmHg Patient Gender: M                   HR:           88 bpm. Exam Location:  Inpatient Procedure: 2D Echo, Cardiac Doppler and Color Doppler (Both Spectral and Color            Flow Doppler were utilized during procedure). Indications:    Pre-operative cardiovascular examination  History:        Patient has prior history  of Echocardiogram examinations, most                 recent 10/03/2015. Risk Factors:Dyslipidemia, Hypertension and                 Sleep Apnea.  Sonographer:    Sherlean Dubin Referring Phys: [phone] BELKYS A REGALADO IMPRESSIONS  1. Left ventricular ejection fraction, by estimation, is 65 to 70%. The left ventricle has normal function. The left ventricle has no regional wall motion abnormalities. There is mild left ventricular hypertrophy. Left ventricular diastolic parameters were normal.  2. Right ventricular systolic function is low normal. The right ventricular size is normal. There is normal pulmonary artery systolic pressure.  3. Restricted posterior leaflet mobility of the mitral valve leaflets.  4. The mitral valve is abnormal. Trivial mitral valve regurgitation. Moderate to severe mitral annular calcification.  5. 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 The aortic valve is calcified. There is moderate calcification of the aortic valve. There is moderate thickening of the aortic valve. Aortic valve regurgitation is mild to moderate. Aortic valve  sclerosis/calcification is present, without any evidence of aortic stenosis.  6. The inferior vena cava is normal in size with greater than 50% respiratory variability, suggesting right atrial pressure of 3 mmHg. Comparison(s): Prior images unable to be directly viewed, comparison made by report only. Conclusion(s)/Recommendation(s): Small linear echodensity noted on ventricular aspect  of aortic valve. 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 FINDINGS  Left Ventricle: Left ventricular ejection fraction, by estimation, is 65 to 70%. The left ventricle has normal function. The left ventricle has no regional wall motion abnormalities. The left ventricular internal cavity size was normal in size. There is   mild left ventricular hypertrophy. Left ventricular diastolic parameters were normal. Right Ventricle: The right ventricular size is normal. Right vetricular wall thickness was not well visualized. Right ventricular systolic function is low normal. There is normal pulmonary artery systolic pressure. The tricuspid regurgitant velocity is 1.86 m/s, and with an assumed right atrial pressure of 3 mmHg, the estimated right ventricular systolic pressure is 16.8 mmHg. Left Atrium: Left atrial size was normal in size. Right Atrium: Right atrial size was normal in size. Pericardium: There is no evidence of pericardial effusion. Mitral Valve: Calcified subvalvular apparatus with appearance of small ruptured chordae tendinae. The mitral valve is abnormal. There is moderate thickening of the mitral valve leaflet(s). There is moderate calcification of the mitral valve leaflet(s). Restricted posterior leaflet mobility of the mitral valve leaflets. Moderate to severe mitral annular calcification. Trivial mitral valve regurgitation. MV peak gradient, 10.6 mmHg. The mean mitral valve gradient is 5.0 mmHg. Tricuspid Valve: The tricuspid valve is grossly normal. Tricuspid valve regurgitation is trivial. No evidence of tricuspid stenosis. 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 The aortic valve is calcified. There is moderate calcification of the aortic valve. There is moderate thickening of the aortic valve. Aortic valve regurgitation is mild to moderate. Aortic valve sclerosis/calcification is present, without any evidence of aortic stenosis. Aortic valve peak gradient measures 13.0 mmHg. Pulmonic Valve: The pulmonic valve was not well visualized. Pulmonic valve regurgitation is not visualized. No evidence of pulmonic stenosis. Aorta: The aortic root is normal in size and structure. Venous: The inferior vena cava is normal in size with greater than 50% respiratory variability, suggesting right atrial  pressure of 3 mmHg. IAS/Shunts: The atrial septum is grossly normal.  LEFT VENTRICLE PLAX 2D LVIDd:         4.00 cm     Diastology LVIDs:         2.80 cm     LV e' medial:    6.20 cm/s LV PW:         0.90 cm     LV E/e' medial:  14.4 LV IVS:        1.40 cm     LV e' lateral:   13.10 cm/s LVOT diam:     2.23 cm     LV E/e' lateral: 6.8 LV SV:         97 LV SV Index:   46 LVOT Area:     3.91 cm  LV Volumes (MOD) LV vol d, MOD A2C: 99.9 ml LV vol d, MOD A4C: 81.2 ml LV vol s, MOD A2C: 25.4 ml LV vol s, MOD A4C: 23.5 ml LV SV MOD A2C:     74.5 ml LV SV MOD A4C:     81.2 ml LV SV MOD BP:      66.5 ml RIGHT VENTRICLE            IVC RV Basal diam:  3.92 cm    IVC diam: 1.38 cm RV Mid diam:    3.59 cm RV S prime:  9.79 cm/s TAPSE (M-mode): 1.3 cm LEFT ATRIUM             Index        RIGHT ATRIUM           Index LA diam:        3.71 cm 1.77 cm/m   RA Area:     18.30 cm LA Vol (A2C):   61.1 ml 29.09 ml/m  RA Volume:   58.30 ml  27.76 ml/m LA Vol (A4C):   38.1 ml 18.14 ml/m LA Biplane Vol: 49.8 ml 23.71 ml/m  AORTIC VALVE AV Area (Vmax): 2.78 cm AV Vmax:        180.00 cm/s AV Peak Grad:   13.0 mmHg LVOT Vmax:      128.00 cm/s LVOT Vmean:     87.800 cm/s LVOT VTI:       0.248 m  AORTA Ao Root diam: 3.02 cm MITRAL VALVE                TRICUSPID VALVE MV Area (PHT): 3.60 cm     TR Peak grad:   13.8 mmHg MV Area VTI:   2.61 cm     TR Vmax:        186.00 cm/s MV Peak grad:  10.6 mmHg MV Mean grad:  5.0 mmHg     SHUNTS MV Vmax:       1.63 m/s     Systemic VTI:  0.25 m MV Vmean:      111.0 cm/s   Systemic Diam: 2.23 cm MV Decel Time: 211 msec MV E velocity: 89.10 cm/s MV A velocity: 128.00 cm/s MV E/A ratio:  0.70 Shelda Bruckner MD Electronically signed by Shelda Bruckner MD Signature Date/Time: 02/20/2024/2:18:13 PM    Final    CT THORACIC SPINE WO CONTRAST Result Date: 02/11/2024 EXAM: CT THORACIC SPINE WITHOUT CONTRAST 02/10/2024 05:21:58 PM TECHNIQUE: CT of the thoracic spine was performed without the  administration of intravenous contrast. Multiplanar reformatted images are provided for review. Automated exposure control, iterative reconstruction, and/or weight based adjustment of the mA/kV was utilized to reduce the radiation dose to as low as reasonably achievable. COMPARISON: MRI of the thoracic spine dated 02/09/2024. CLINICAL HISTORY: Compression fracture, thoracic. FINDINGS: BONES AND ALIGNMENT: Normal vertebral body heights. No acute fracture or suspicious bone lesion. There is moderate dextroscoliosis and moderate kyphosis. Erosions are present within the superior endplates of T11 and T12 anteriorly, likely secondary to degenerative disc disease. The vertebral bodies are diffusely sclerotic, presumably representing renal osteodystrophy. DEGENERATIVE CHANGES: Erosions are present within the superior endplates of T11 and T12 anteriorly, likely secondary to degenerative disc disease. SOFT TISSUES: No acute abnormality. LUNGS: There is mild atelectasis present independently within the lungs bilaterally. IMPRESSION: 1. No acute fracture. 2. Moderate dextroscoliosis and moderate kyphosis. 3. Erosions within the superior endplates of T11 and T12 anteriorly, likely secondary to degenerative disc disease. 4. Diffuse sclerosis of the vertebral bodies, presumably representing renal osteodystrophy. 5. Mild bilateral pulmonary atelectasis. Electronically signed by: Evalene Coho MD 02/11/2024 04:57 AM EST RP Workstation: [id number]   DG Knee 1-2 Views Right Result Date: 02/10/2024 CLINICAL DATA:  Pain. EXAM: RIGHT KNEE - 1-2 VIEW COMPARISON:  06/11/2023 FINDINGS: The bones are subjectively under mineralized. Medial and lateral tibiofemoral joint space narrowing. There is tricompartmental peripheral spurring. Moderate joint effusion. Quadriceps and patellar tendon enthesophytes. No erosive or bony destructive change. Vascular calcifications with vascular stent in place. IMPRESSION: Tricompartmental  osteoarthritis with moderate joint effusion. Electronically Signed  By: Andrea Gasman M.D.   On: 02/10/2024 14:45   MR THORACIC SPINE WO CONTRAST Result Date: 02/09/2024 EXAM: MRI Thoracic Spine Without Intravenous Contrast 02/09/2024 06:28:29 PM TECHNIQUE: Multiplanar multisequence MRI of the thoracic spine was performed without the administration of intravenous contrast. COMPARISON: None available. CLINICAL HISTORY: Mid-back pain FINDINGS: BONES AND ALIGNMENT: Dectrocurvature. Linear edema in the left eccentric T11 and T12 superior endplates could represent degenerative change or acute/subacute fractures without significant height loss. Diffusely low signal within the bone marrow, likely related to known chronic kidney disease. No specific evidence of discitis/osteomyelitis. Degenerative/discogenic changes can play a role and signal changes at multiple levels. SPINAL CORD: Normal spinal cord volume. Normal spinal cord signal. SOFT TISSUES: Unremarkable. DEGENERATIVE CHANGES: Multilevel facet arthropathy and dextrocurvature contributes to foraminal stenosis. This includes moderate right foraminal stenosis at T2 T3 , T8-T9, and T9-T10. Severe left foraminal stenosis at T11-T12. Significant canal stenosis . IMPRESSION: 1. Linear edema in the left eccentric T11 and T12 superior endplates could represent degenerative change or acute/subacute fractures without significant height loss. Correlate with the presence or absence of point tenderness. 2. Severe left foraminal stenosis at T11-T12. 3. Moderate right foraminal stenosis at T2 T3 , T8-T9, and T9-T 4. S-shaped thoracolumbar curvature. 5. No significant canal stenosis. Electronically signed by: Gilmore Molt 02/09/2024 08:20 PM EST RP Workstation: [id number]   MR LUMBAR SPINE WO CONTRAST Result Date: 02/09/2024 EXAM: MRI LUMBAR SPINE 02/09/2024 06:28:15 PM TECHNIQUE: Multiplanar multisequence MRI of the lumbar spine was performed without the  administration of intravenous contrast. COMPARISON: None available. CLINICAL HISTORY: Low back pain, prior surgery, new symptoms FINDINGS: BONES AND ALIGNMENT: Grade 1 anterolisthesis of L3 on L4. Rotatory levocurvature. Heterogeneous bone marrow which is low in attenuation on T1, likely related to patient's known chronic renal disease. Mild right lateral L3-L4 disc edema and surrounding marrow edema. Normal vertebral body heights. SPINAL CORD: The conus terminates normally. SOFT TISSUES: No paraspinal mass. L1-L2: No significant disc herniation. No spinal canal stenosis or neural foraminal narrowing. L2-L3: Mild disc bulge, right greater than left facet arthropathy. No significant canal or foraminal stenosis. L3-L4: Grade 1 anterolisthesis. Right lateral disc edema and degenerative disc disease with mild associated endplate signal changes. Disc bulging with bilateral facet arthropathy. Severe right foraminal stenosis. Moderate right subarticular recess stenosis. Mild canal stenosis. L4-L5: Disc bulge, right greater than left facet arthropathy. Resulting moderate right foraminal stenosis. Patent canal and left foramen. L5-S1: Disc bulge and endplate spurring. Facet arthropathy resulting in mild left foraminal stenosis. IMPRESSION: 1. At L3-L4, moderate to severe right and mild left foraminal stenosis. Moderate right subarticular recess stenosis and mild canal stenosis. 2. At L4-L5, moderate right foraminal stenosis. 3. Right lateral L3-L4 disc edema and mild surrounding marrow edema which is most likely degenerative in the setting of rotatory levocurvature and severe degenerative changes at this level. Recommend correlation with infectious markers to exclude early discitis, which is thought less likely. Electronically signed by: Gilmore Molt 02/09/2024 08:01 PM EST RP Workstation: [id number]   MR SHOULDER RIGHT WO CONTRAST Result Date: 02/09/2024 CLINICAL DATA:  Pain after fall. 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 EXAM: MRI OF THE RIGHT SHOULDER WITHOUT CONTRAST TECHNIQUE: Multiplanar, multisequence MR imaging of the shoulder was performed. No intravenous contrast was administered. COMPARISON:  Right shoulder radiographs dated 02/07/2024. FINDINGS: Bones/Joint Status post right reverse shoulder arthroplasty with associated susceptibility artifact which significantly limits evaluation of the surrounding bone and soft tissues. No discrete fracture identified on this  limited examination. Soft tissue and Muscles There is a well-circumscribed T2 intermediate to hyperintense and T1 hypointense fluid collection within the posterior subacromial/subdeltoid space measuring approximately 5.1 x 2.9 x 1.9 cm (series 6, image 5 and series 9, image 15), favored to represent a postoperative collection such as a seroma. Severe fatty atrophy of the subscapularis, supraspinatus, and infraspinatus muscles. IMPRESSION: 1. Status post right reverse shoulder arthroplasty with associated susceptibility artifact which significantly limits evaluation of the surrounding bone and soft tissues. No discrete fracture identified on this limited examination. CT could be considered for further evaluation of the reverse shoulder arthroplasty and if there is persistent concern for acute fracture. 2. A well-circumscribed fluid collection within the posterior subacromial/subdeltoid space measuring approximately 5.1 x 2.9 x 1.9 cm is favored to represent a postoperative collection such as a seroma. 3. Severe fatty atrophy of the subscapularis, supraspinatus, and infraspinatus muscles. Electronically Signed   By: Harrietta Sherry M.D.   On: 02/09/2024 19:07   CT TEMPORAL BONES W WO CONTRAST Result Date: 02/09/2024 EXAM: CT TEMPORAL BONES WITH AND WITHOUT CONTRAST 02/09/2024 05:08:46 PM TECHNIQUE: CT of the temporal bones was performed with and without the administration of intravenous contrast. 75 mL of iohexol  (OMNIPAQUE )  350 MG/ML injection was administered. Multiplanar reformatted images are provided for review. Automated exposure control, iterative reconstruction, and/or weight based adjustment of the mA/kV was utilized to reduce the radiation dose to as low as reasonably achievable. COMPARISON: CT head and MRI brain 02/08/2024. CLINICAL HISTORY: Sepsis with suspected source of suppurative left acute otitis media. FINDINGS: RIGHT TEMPORAL BONE: EXTERNAL AUDITORY CANAL: The right external auditory canal appears mildly narrowed at the junction of the cartilaginous and osseous portions. There is no evidence of mass. Scutum is intact. MIDDLE EAR CAVITY: The right middle ear cavity is clear. The ossicles are normal in position and morphology without erosive change. Normal appearance of the right tympanic membrane. MASTOID AIR CELLS: Moderate right mastoid effusion with significant opacification of mastoid air cells most pronounced posteriorly and inferiorly. There are residual aerated mastoid air cells superiorly. There is partial opacification of the mastoid antrum. Osseous septations of the mastoid air cells are intact. There is no evidence of coalescent mastoiditis. INNER EAR: The cochlea and vestibule are unremarkable. Normal mineralization of the otic capsule. Normal semicircular canals. The vestibular aqueduct is not dilated. INTERNAL AUDITORY CANAL: Unremarkable. Normal bony canal of the facial nerve. LEFT TEMPORAL BONE: EXTERNAL AUDITORY CANAL: There is additional mild soft tissue thickening along the osseous left external auditory canal. The left scutum is intact. No bony erosion. MIDDLE EAR CAVITY: There is complete opacification of the left middle ear with soft tissue fluid surrounding the ossicles. An underlying small mass cannot be excluded; however, there are no erosive changes of the ossicles appreciated. There is likely thickening of the left tympanic membrane, which is partially obscured by opacification of the left  middle ear. There is no evidence of dehiscence of the tegmen tympani or tegmen mastoidium. MASTOID AIR CELLS: Large left mastoid effusion with complete opacification of the mastoid air cells. There is opacification of the mastoid antrum. The osseous septations of the mastoid air cells are intact. There is no evidence of coalescent mastoiditis. INNER EAR: The cochlea and vestibule are unremarkable. Normal mineralization of the otic capsule. Normal semicircular canals. The vestibular aqueduct is not dilated. INTERNAL AUDITORY CANAL: Unremarkable. Normal bony canal of the facial nerve. VASCULAR: Normal jugular bulbs. Normal carotid canals. There is no abnormal enhancement or abnormal soft tissue  adjacent to the mastoid temporal bones. There is no evidence of Bezold abscess. BRAIN: Dense calcifications along the visualized falx and tentorium. Unremarkable. ORBITS: No acute abnormality. SINUSES: Mild mucosal thickening in the inferior left maxillary sinus. Additional mild scattered mucosal thickening in the ethmoid sinuses. OTHER FINDINGS: Punctate calcifications in the partially visualized bilateral parotid glands. Degenerative changes of the bilateral temporomandibular joints. IMPRESSION: 1. Findings compatible with left otomastoiditis. No evidence of coalescent mastoiditis, tegmen dehiscence, or adjacent soft tissue abscess. 2. Complete opacification of the left middle ear with surrounding soft tissue/fluid about the ossicles. Small underlying mass cannot be excluded. There is no ossicular erosion. 3. Moderate right mastoid effusion without coalescent mastoiditis. Electronically signed by: Donnice Mania MD 02/09/2024 06:32 PM EST RP Workstation: [id number]   MR Cervical Spine Wo Contrast Result Date: 02/08/2024 EXAM: MRI CERVICAL SPINE WITHOUT CONTRAST 02/08/2024 06:05:08 PM TECHNIQUE: Multiplanar multisequence MRI of the cervical spine was performed. COMPARISON: None available. CLINICAL HISTORY: Ataxia,  nontraumatic, cervical pathology suspected FINDINGS: BONES AND ALIGNMENT: C3-C5 ACDF with C4 corpectomy. Normal alignment. Edema surrounding the C5-C6 disc is likely degenerative/discogenic. RIght C4-C5 perifact edema, also likely degenerative. Otherwise, Bone marrow signal is unremarkable. SPINAL CORD: Normal spinal cord size. No abnormal spinal cord signal. SOFT TISSUES: No paraspinal mass. C2-C3: Ligamentum flavum thickening and mild facet arthropathy. Resulting mild to moderate canal stenosis. C3-C4: ACDF. Ligamentum flavum thickening and bilateral facet arthropathy. Mild to moderate canal stenosis. Moderate bilateral foraminal stenosis. C4-C5: ACDF. C4 corpectomy. Facter athropathy greater on the right where there also is perifacet edema. Motion limited assessment with widely patent canal and likely patent foramina. C5-C6: Small posterior disc osteophyte complex . Bilateral facet and uncovertebral hypertrophy. Motion-limited assessment with suspected moderate left foraminal stenosis. Patent canal and right foramen. C6-C7: Right .in the left facet and uncovertebral hypertrophy. Right foraminal stenosis. Patent canal and left foramen. C7-T1: No significant disc herniation. No spinal canal stenosis or neural foraminal narrowing. IMPRESSION: 1. C3-C5 ACDF with C4 corpectomy.  Limited evaluation of hardware by MRI. 2. Motion limited study with suspected moderate foraminal stenosis bilaterally at C3-C4 and on the left at C5-C6. Mild to moderate canal stenosis at C2-C3 and C3-C4. 3. RIght C4-C5 perifacet and C5-C6 discogenic endplate edema, likely degenerative in etiology. Electronically signed by: Gilmore Molt 02/08/2024 08:16 PM EST RP Workstation: [id number]   MR BRAIN WO CONTRAST Result Date: 02/08/2024 EXAM: MRI BRAIN WITHOUT CONTRAST 02/08/2024 05:44:06 PM TECHNIQUE: Multiplanar multisequence MRI of the head/brain was performed without the administration of intravenous contrast. COMPARISON: Same day CT  head and comparison MRI brain 05/25/2023. CLINICAL HISTORY: Ataxia, head trauma. FINDINGS: BRAIN AND VENTRICLES: Similar appearance of extensive dural calcifications. T2 and FLAIR hyperintensity in the periventricular and subcortical white matter compatible with mild chronic microvascular ischemic changes. There is a small remote infarct in the left frontal periventricular white matter which is similar to prior. Lacunar infarcts in the left basal ganglia. Remote infarct in the left occipital lobe. No acute infarct. No intracranial hemorrhage. No mass. No midline shift. No hydrocephalus. The sella is unremarkable. Normal flow voids. ORBITS: Bilateral lens replacements. 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 Large bilateral mastoid effusions. Mucosal thickening in the right maxillary sinus. BONES AND SOFT TISSUES: Subtle right frontal scalp soft tissue swelling better evaluated on the same day CT. Partially visualized cervical fusion hardware. Normal marrow signal. IMPRESSION: 1. No acute intracranial abnormality related to the head trauma. 2. Large bilateral mastoid effusions. 3. Small remote infarct in the  left frontal periventricular white matter and remote lacunar infarcts in the left basal ganglia. 4. Remote infarct in the left occipital lobe. Electronically signed by: Donnice Mania MD 02/08/2024 06:59 PM EST RP Workstation: [id number]   CT Head Wo Contrast Result Date: 02/08/2024 CLINICAL DATA:  Status post trauma. EXAM: CT HEAD WITHOUT CONTRAST TECHNIQUE: Contiguous axial images were obtained from the base of the skull through the vertex without intravenous contrast. RADIATION DOSE REDUCTION: This exam was performed according to the departmental dose-optimization program which includes automated exposure control, adjustment of the mA and/or kV according to patient size and/or use of iterative reconstruction technique. COMPARISON:  June 09, 2023 FINDINGS: Brain: No  evidence of acute infarction, hemorrhage, hydrocephalus, extra-axial collection or mass lesion/mass effect. Stable marked severity pachymeningeal calcification is noted. There is a small chronic left occipital lobe infarct. A chronic left basal gangliar infarct is also seen. Vascular: Marked severity bilateral cavernous carotid artery calcification is noted. Skull: Normal. Negative for fracture or focal lesion. Sinuses/Orbits: No acute finding. Other: There is mild right frontal scalp soft tissue swelling. IMPRESSION: 1. No acute intracranial abnormality. 2. Small chronic left occipital lobe infarct. 3. Chronic left basal gangliar infarct. 4. Mild right frontal scalp soft tissue swelling. Electronically Signed   By: Suzen Dials M.D.   On: 02/08/2024 17:57   DG Hip Unilat W or Wo Pelvis 2-3 Views Right Result Date: 02/08/2024 EXAM: 2 OR MORE VIEW(S) XRAY OF THE BILATERAL HIP 02/08/2024 12:45:00 PM COMPARISON: None available. CLINICAL HISTORY: fall, R sided pain/soreness FINDINGS: BONES AND JOINTS: Left Hip arthroplasty in place. Large subcortical cysts at the Right Femoral Neck and Head. Mild degenerative changes of the Right Hip. No acute fracture. No malalignment. SOFT TISSUES: Right lower quadrant surgical clips noted. Extensive vascular calcifications. IMPRESSION: 1. No acute fracture or dislocation. 2. Large subcortical cysts at the right femoral neck and head. 3. Left hip arthroplasty in place. Electronically signed by: Morgane Naveau MD 02/08/2024 01:48 PM EST RP Workstation: [id number]   DG Shoulder 1 View Right Result Date: 02/08/2024 CLINICAL DATA:  Right shoulder pain after a fall. EXAM: RIGHT SHOULDER - 1 VIEW COMPARISON:  09/12/2023 FINDINGS: A single view of the right shoulder shows the patient to be status post reverse arthroplasty. No hardware complication evident on this single film. No obvious shoulder dislocation. IMPRESSION: Status post reverse arthroplasty without complicating  features. Electronically Signed   By: Camellia Candle M.D.   On: 02/08/2024 08:38   DG Ribs Unilateral W/Chest Right Result Date: 02/07/2024 EXAM: AP VIEW XRAY OF THE RIGHT RIBS AND CHEST 02/07/2024 02:45:02 PM COMPARISON: None available. CLINICAL HISTORY: right shoulder pain, h/o fall FINDINGS: BONES: Reverse right shoulder arthroplasty. No acute displaced rib fracture. LUNGS AND PLEURA: Linear atelectasis or scarring at left lung base. No pleural effusion or pneumothorax. HEART AND MEDIASTINUM: Calcified aorta. No acute abnormality of the cardiac and mediastinal silhouettes. IMPRESSION: 1. No acute rib fracture.Please note, nondisplaced rib fracture may be occult on radiograph and is not excluded. 2. No acute cardiopulmonary abnormality. Electronically signed by: Morgane Naveau MD 02/07/2024 03:25 PM EST RP Workstation: [id number]   DG Cervical Spine 2-3 Views Result Date: 02/07/2024 EXAM: 2 or 3 VIEW(S) XRAY OF THE CERVICAL SPINE 02/07/2024 02:44:49 PM COMPARISON: X-ray cervical spine from 03/22/2023. CLINICAL HISTORY: right shoulder pain, h/o fall FINDINGS: LIMITATIONS: Limited evaluation due to overlapping osseous structures and overlying soft tissues. On the lateral view of the C6 and c7 vertebral bodies are not visualized. BONES: Vertebral body heights  are maintained. Alignment is normal. C3 to C5 discectomy and fusion that appears similar to prior. DISCS AND DEGENERATIVE CHANGES: Multilevel at least mild degenerative changes of the spine. SOFT TISSUES: Atherosclerotic plaque. No prevertebral soft tissue swelling. The visualized lungs appear clear. IMPRESSION: 1. No acute osseous abnormality.Limited evaluation due to overlapping osseous structures and overlying soft tissues. 2. C3 to C5 discectomy and fusion that appears similar to prior. Electronically signed by: Morgane Naveau MD 02/07/2024 03:23 PM EST RP Workstation: [id number]    Microbiology: Results for orders placed or performed during the  hospital encounter of 02/08/24  Resp panel by RT-PCR (RSV, Flu A&B, Covid) Anterior Nasal Swab     Status: None   Collection Time: 02/08/24 12:01 PM   Specimen: Anterior Nasal Swab  Result Value Ref Range Status   SARS Coronavirus 2 by RT PCR NEGATIVE NEGATIVE Final   Influenza A by PCR NEGATIVE NEGATIVE Final   Influenza B by PCR NEGATIVE NEGATIVE Final    Comment: (NOTE) The Xpert Xpress SARS-CoV-2/FLU/RSV plus assay is intended as an aid in the diagnosis of influenza from Nasopharyngeal swab specimens and should not be used as a sole basis for treatment. Nasal washings and aspirates are unacceptable for Xpert Xpress SARS-CoV-2/FLU/RSV testing.  Fact Sheet for Patients: bloggercourse.com  Fact Sheet for Healthcare Providers: seriousbroker.it  This test is not yet approved or cleared by the [place]  FDA and has been authorized for detection and/or diagnosis of SARS-CoV-2 by FDA under an Emergency Use Authorization (EUA). This EUA will remain in effect (meaning this test can be used) for the duration of the COVID-19 declaration under Section 564(b)(1) of the Act, 21 U.S.C. section 360bbb-3(b)(1), unless the authorization is terminated or revoked.     Resp Syncytial Virus by PCR NEGATIVE NEGATIVE Final    Comment: (NOTE) Fact Sheet for Patients: bloggercourse.com  Fact Sheet for Healthcare Providers: seriousbroker.it  This test is not yet approved or cleared by the [place]  FDA and has been authorized for detection and/or diagnosis of SARS-CoV-2 by FDA under an Emergency Use Authorization (EUA). This EUA will remain in effect (meaning this test can be used) for the duration of the COVID-19 declaration under Section 564(b)(1) of the Act, 21 U.S.C. section 360bbb-3(b)(1), unless the authorization is terminated or revoked.  Performed at [facility], [address], [place], [name]   Blood culture (routine x 2)     Status: None   Collection Time: 02/08/24  9:53 PM   Specimen: BLOOD  Result Value Ref Range Status   Specimen Description BLOOD SITE NOT SPECIFIED  Final   Special Requests   Final    BOTTLES DRAWN AEROBIC AND ANAEROBIC Blood Culture results may not be optimal due to an inadequate volume of blood received in culture bottles   Culture   Final    NO GROWTH 5 DAYS Performed at [facility], [address], [place], [name]    Report Status 02/13/2024 FINAL  Final  Blood culture (routine x 2)     Status: None   Collection Time: 02/08/24 10:39 PM   Specimen: BLOOD  Result Value Ref Range Status   Specimen Description BLOOD BLOOD LEFT WRIST  Final   Special Requests   Final    BOTTLES DRAWN AEROBIC AND ANAEROBIC Blood Culture adequate volume   Culture   Final    NO GROWTH 5 DAYS Performed at [facility], [address] [place], [name]  Report Status 02/13/2024 FINAL  Final  Respiratory (~20 pathogens) panel by PCR     Status: None   Collection Time: 02/09/24  2:13 PM   Specimen: Nasopharyngeal Swab; Respiratory  Result Value Ref Range Status   Adenovirus NOT DETECTED NOT DETECTED Final   Coronavirus 229E NOT DETECTED NOT DETECTED Final    Comment: (NOTE) The Coronavirus on the Respiratory Panel, DOES NOT test for the novel  Coronavirus (2019 nCoV)    Coronavirus HKU1 NOT DETECTED NOT DETECTED Final   Coronavirus NL63 NOT DETECTED NOT DETECTED Final   Coronavirus OC43 NOT DETECTED NOT DETECTED Final   Metapneumovirus NOT DETECTED NOT DETECTED Final   Rhinovirus / Enterovirus NOT DETECTED NOT DETECTED Final   Influenza A NOT DETECTED NOT DETECTED Final   Influenza B NOT DETECTED NOT DETECTED Final   Parainfluenza Virus 1 NOT DETECTED NOT DETECTED Final   Parainfluenza Virus 2 NOT DETECTED NOT DETECTED Final   Parainfluenza Virus 3 NOT DETECTED NOT DETECTED Final   Parainfluenza  Virus 4 NOT DETECTED NOT DETECTED Final   Respiratory Syncytial Virus NOT DETECTED NOT DETECTED Final   Bordetella pertussis NOT DETECTED NOT DETECTED Final   Bordetella Parapertussis NOT DETECTED NOT DETECTED Final   Chlamydophila pneumoniae NOT DETECTED NOT DETECTED Final   Mycoplasma pneumoniae NOT DETECTED NOT DETECTED Final    Comment: Performed at [facility], [address], [place], [name]  Body fluid culture w Gram Stain     Status: None   Collection Time: 02/18/24  2:24 PM   Specimen: Synovium; Body Fluid  Result Value Ref Range Status   Specimen Description SYNOVIAL  Final   Special Requests RIGHT KNEE  Final   Gram Stain   Final    ABUNDANT WBC PRESENT, PREDOMINANTLY PMN NO ORGANISMS SEEN    Culture   Final    NO GROWTH 3 DAYS Performed at [facility], [address] [place], [name]    Report Status 02/22/2024 FINAL  Final  Culture, blood (Routine X 2) w Reflex to ID Panel     Status: None   Collection Time: 02/25/24  8:00 AM   Specimen: BLOOD LEFT HAND  Result Value Ref Range Status   Specimen Description BLOOD LEFT HAND  Final   Special Requests   Final    BOTTLES DRAWN AEROBIC AND ANAEROBIC Blood Culture adequate volume   Culture   Final    NO GROWTH 5 DAYS Performed at [facility], [address] [place], [name]    Report Status 03/01/2024 FINAL  Final  Culture, blood (Routine X 2) w Reflex to ID Panel     Status: None   Collection Time: 02/25/24  8:03 AM   Specimen: BLOOD LEFT HAND  Result Value Ref Range Status   Specimen Description BLOOD LEFT HAND  Final   Special Requests   Final    BOTTLES DRAWN AEROBIC AND ANAEROBIC Blood Culture adequate volume   Culture   Final    NO GROWTH 5 DAYS Performed at [facility], [address], [place], [name]    Report Status 03/01/2024 FINAL  Final  Body fluid culture w Gram Stain     Status: None   Collection Time: 02/25/24  4:23 PM    Specimen: SHOULDER; Body Fluid  Result Value Ref Range Status   Specimen Description FLUID RIGHT SHOULDER  Final   Special Requests NO 2 ANTERIOR  Final  Gram Stain   Final    ABUNDANT WBC PRESENT, PREDOMINANTLY PMN NO ORGANISMS SEEN    Culture   Final    NO GROWTH 3 DAYS Performed at [facility], [address] [place], [name]    Report Status 02/28/2024 FINAL  Final  Body fluid culture w Gram Stain     Status: None   Collection Time: 02/25/24  4:27 PM   Specimen: Body Fluid  Result Value Ref Range Status   Specimen Description FLUID RIGHT SHOULDER  Final   Special Requests NO 1 POSTERIOR  Final   Gram Stain   Final    ABUNDANT WBC PRESENT, PREDOMINANTLY PMN NO ORGANISMS SEEN    Culture   Final    NO GROWTH 3 DAYS Performed at [facility], [address], [place], [name]    Report Status 02/28/2024 FINAL  Final  Surgical pcr screen     Status: None   Collection Time: 02/28/24 12:50 AM   Specimen: Nasal Mucosa; Nasal Swab  Result Value Ref Range Status   MRSA, PCR NEGATIVE NEGATIVE Final   Staphylococcus aureus NEGATIVE NEGATIVE Final    Comment: (NOTE) The Xpert SA Assay (FDA approved for NASAL specimens in patients 52 years of age and older), is one component of a comprehensive surveillance program. It is not intended to diagnose infection nor to guide or monitor treatment. Performed at [facility], [address] [place], [name]   Aerobic/Anaerobic Culture w Gram Stain (surgical/deep wound)     Status: None (Preliminary result)   Collection Time: 02/28/24  4:30 PM   Specimen: Joint, Other; Body Fluid  Result Value Ref Range Status   Specimen Description SHOULDER RIGHT  Final   Special Requests SWAB A  INTRA ARTICULAR SUTURE  Final   Gram Stain NO WBC SEEN NO ORGANISMS SEEN   Final   Culture   Final    NO GROWTH 4 DAYS NO ANAEROBES ISOLATED; CULTURE IN PROGRESS FOR 5 DAYS Performed at [facility]  Lab, [address] [place], [name]    Report Status PENDING  Incomplete  Aerobic/Anaerobic Culture w Gram Stain (surgical/deep wound)     Status: None (Preliminary result)   Collection Time: 02/28/24  4:35 PM   Specimen: Joint, Other; Body Fluid  Result Value Ref Range Status   Specimen Description TISSUE RIGHT SHOULDER  Final   Special Requests ESWAB B INTRA ARTICULAR SUTURE  Final   Gram Stain NO WBC SEEN NO ORGANISMS SEEN   Final   Culture   Final    NO GROWTH 4 DAYS NO ANAEROBES ISOLATED; CULTURE IN PROGRESS FOR 5 DAYS Performed at [facility] [place] [facility], [address] [place], [name]    Report Status PENDING  Incomplete  Aerobic/Anaerobic Culture w Gram Stain (surgical/deep wound)     Status: None (Preliminary result)   Collection Time: 02/28/24  4:36 PM   Specimen: Joint, Other; Body Fluid  Result Value Ref Range Status   Specimen Description TISSUE RIGHT SHOULDER  Final   Special Requests SWAB C INTRA ARTICULAR SUTURE  Final   Gram Stain NO WBC SEEN NO ORGANISMS SEEN   Final   Culture   Final    NO GROWTH 4 DAYS NO ANAEROBES ISOLATED; CULTURE IN PROGRESS FOR 5 DAYS Performed at [facility], [address], [place], [name]    Report Status PENDING  Incomplete  Labs: CBC: Recent Labs  Lab 02/27/24 0301 02/28/24 0316 02/28/24 0731 02/28/24 1501  WBC 6.2 7.0 8.0  --   NEUTROABS  --   --  5.6  --   HGB 7.8* 8.3* 7.3* 8.2*  HCT 24.3* 26.2* 22.3* 24.0*  MCV 91.0 90.0 88.5  --   PLT 335 324 350  --    Basic Metabolic Panel: Recent Labs  Lab 02/28/24 0316 02/28/24 1501 02/29/24 0655 03/01/24 0411 03/02/24 0420 03/03/24 0149  NA 132*  133* 134* 134* 134* 132* 132*  K 4.8  4.6 4.4 4.8 4.9 5.2* 4.5  CL 89*  90* 94* 95* 94* 91* 90*  CO2 26  28  --  25 26 25 29   GLUCOSE 88  96 86 96 92 86 92  BUN 54*  53* 25* 35* 53* 66* 38*  CREATININE 7.54*  7.70* 4.30* 5.32* 6.80* 8.34* 5.87*  CALCIUM  8.7*  8.7*  --  9.1 9.1 9.4  9.4  PHOS 5.2*  --  4.7* 4.3 4.1 3.0   Liver Function Tests: Recent Labs  Lab 02/27/24 0301 02/28/24 0316 02/29/24 0655 03/01/24 0411 03/02/24 0420 03/03/24 0149  AST 18  --   --   --   --   --   ALT <5  --   --   --   --   --   ALKPHOS 98  --   --   --   --   --   BILITOT 0.4  --   --   --   --   --   PROT 7.8  --   --   --   --   --   ALBUMIN  3.0* 3.1* 2.9* 2.8* 2.9* 2.9*   CBG: Recent Labs  Lab 03/01/24 2319 03/02/24 1221 03/02/24 1615 03/02/24 1942 03/03/24 0730  GLUCAP 96 81 126* 91 83    Discharge time spent: greater than 30 minutes.  Signed: Sabas GORMAN Brod, MD [facility] 03/03/2024 "

## 2024-03-03 NOTE — Progress Notes (Signed)
 Inpatient Rehabilitation Admission Medication Review by a Pharmacist  A complete drug regimen review was completed for this patient to identify any potential clinically significant medication issues.  High Risk Drug Classes Is patient taking? 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 (stop date 04/10/24)  Opioid Yes Oxycodone : pain  Antiplatelet Yes Asa/Plavix : PAD with RLE ischemia s/p stent   Hypoglycemics/insulin  Yes SSI: hyperglycemia  Vasoactive Medication Yes Midodrine : hypotension  Chemotherapy No   Other Yes Flexeril : muscle spasms Aranesp : anemia Benadryl : itching Lipitor: HLD Sensipar : hypercalcemia Tylenol , Gabapentin , Lidoderm : pain Protonix : GERD Renvela : ESRD- hyperphosphatemia Robitussin: cough FeSO4, FA, MVI: vitamins/supplements      Type of Medication Issue Identified Description of Issue Recommendation(s)  Drug Interaction(s) (clinically significant)     Duplicate Therapy     Allergy     No Medication Administration End Date     Incorrect Dose     Additional Drug Therapy Needed     Significant med changes from prior encounter (inform family/care partners about these prior to discharge).  Restart or discontinue as appropriate. Communicate medication changes with patient/family at discharge  Other  PTA: Bayer Back & Body (lots of aspirin ), ibuprofen Recommend to discontinue the Bayer Back & Body product as it contains large amounts of aspirin     Clinically significant medication issues were identified that warrant physician communication and completion of prescribed/recommended actions by midnight of the next day:  No  Time spent performing this drug regimen review (minutes): 30  Thank you for allowing pharmacy to be a part of this patients care.   Bascom JAYSON Louder, PharmD 03/03/2024 3:26 PM   **Pharmacist phone  directory can be found on [email or web address] listed under [facility]**

## 2024-03-03 NOTE — Progress Notes (Signed)
 D/C order noted. Contacted [facility] to be advised that pt will d/c to CIR today and navigator will advise clinic staff of pt's d/c date from rehab once known. Will assist as needed.   Randine Mungo Dialysis Navigator [phone] (Coverage)

## 2024-03-03 NOTE — Progress Notes (Addendum)
 "    Babs Arthea DASEN, MD  Physician Physical Medicine and Rehabilitation   PMR Pre-admission    Signed   Date of Service: 03/03/2024 10:59 AM  Related encounter: ED to Hosp-Admission (Current) from 02/08/2024 in [facility] Medical Unit   Signed     Expand All Collapse All  Show:Clear all [x] Written[x] Templated[] Copied  Added by: [x] Alison Heron MATSU, RN[x] Babs Arthea DASEN, MD  [] Hover for details PMR Admission Coordinator Pre-Admission Assessment   Patient: Carl Hornback Sr. is an 64 y.o., male MRN: [id number] DOB: 02-14-1960 Height: 6' 1 (185.4 cm) Weight: 85.2 kg   Insurance Information HMO: yes    PPO:      PCP:      IPA:      80/20:      OTHER:  PRIMARY: [employer] Dual complete      Policy#: [id number]; Medicare [id number]      Subscriber: pt CM Name: [name]      Phone#: [phone] option 3  Fax#: [phone] Pre-Cert#: [id number] Auth for CIR from [facility] with [facility] Medicare won approval on expedited appeal for admit 02/26/24 with next review date 03/03/24.  Updates due to 03/03/24 at fax listed above.          Benefits:  Phone #: [phone]     Name: 1/5 Eff. Date: 02/13/24     Deduct: $283      Out of Pocket Max: $9250      Life Max: none CIR: $1765 co pay per admission      SNF: no co pay per day days 1 until 20; $217 co pay per day days 21 until 100 Outpatient: 80%     Co-Pay: 20% Home Health: 100%      Co-Pay: visits per medical neccesity DME: 80%     Co-Pay: 20% Providers: in network  SECONDARY: Medicaid of [place]      Policy#: [id number]     Phone#: online passport one source 1/13 active MAAQN   Financial Counselor:       Phone#:    The Data Collection Information Summary for patients in Inpatient Rehabilitation Facilities with attached Privacy Act Statement-Health Care Records was provided and verbally reviewed with: Patient and Family   Emergency Contact Information Contact Information       Name Relation Home Work Mobile     [name] Son     [phone]    [name], [name]     [phone]    [name] Daughter     [phone]         Other Contacts       Name Relation Home Work Mobile    [name] Sister [phone]             Current Medical History  Patient Admitting Diagnosis: Debility   History of Present Illness: 64 yo male with history  incomplete SCI requiring  ACDF Dr [name] on 05/25/23, PAD s/p angioplasty/stenting right SFA and popliteal arteries 12/09/23 for critical limb ischemia, ESRD on hemodialysis presented on 02/08/24 with progressive right sided weakness and pain. Also progressive right arm and shoulder weakness, unable to lift the arm overhead. Multiple falls at home. Had progressed over last several months to walking with RW/cane and driving.    Admitted and began antibiotics, neurosurgery, neurology, orthopedic, ENT, Nephrology and infectious disease consulted. Diagnosed with otitis media and ruptured ear drum. Seen by ENT, underwent CT of temporal bone without evidence of coalescent mastoiditis, bone lesion or abscess. Completed 10 days of antibiotics for otitis media.  Will follow up as OP for aural toilet with microscope and updated hearing test.    Initially right sided weakness felt to be recrudescence of old SCI symptoms in the setting of infection. Spinal MRI showed no new cord injury.    He underwent arthrocentesis of right knee  with I and D on 02/18/24 , synovial fluid consistent with septic arthritis. ID consulted and recommended IV antibiotics with Hemodialysis. End date 04/10/24 updated recommendation from ID.    Spiked fever on 1/12. Ortho consulted and underwent arthroscopic I and D with placement of antibiotic beads and drain in right shoulder on 02/28/24. Ortho would recommend finish out IV and po antibiotics per ID and re aspirate the shoulder in approximately 8 weeks.    Continue Lipitor and Plavix  for history of PVD with angioplasty. Nephrology with  ongoing hemodialysis MWF.Continue midodrine  for hypotension. 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 No plan for TEE as would not change management as per ID. SSI for DM and gabapentin  for history of neuropathy.    Monitor HGB, but he would not accept blood transfusions due to his religious beliefs. Heparin  for DVT prophylaxis.    Patient's medical record from [facility] has been reviewed by the rehabilitation admission coordinator and physician.   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     Parathyroid  abnormality      ectopic parathyroid  gland   Presence of arteriovenous fistula for hemodialysis, primary      RUE PER PT RLE   Refusal of blood product      NO WHOLE BLOOD PROUCTS   Renal cell carcinoma (HCC)      s/p hand assisted laparoscopic bilateral nephrectomies 11/29/17, + RCC left   Secondary hyperparathyroidism     Seizures (HCC)      one episode in past, due to elevated Potassium 08/02/20- at least 4 years ago   Sleep apnea      doesn't use CPAP anymore since weight loss   Stroke [facility])      no residual        Has the patient had major surgery during 100 days prior to admission? Yes   Family History   family history includes Anuerysm in his brother; Diabetes in his father; Hypertension in his father and sister; Lupus in his sister; Stroke in his father and sister;  Uterine cancer in his mother.   Current Medications [Current Medications]  [Current Medications]   Current Facility-Administered Medications:    (feeding supplement) PROSource Plus liquid 30 mL, 30 mL, Oral, BID BM, Stovall, Kathryn R, PA-C, 30  mL at 03/03/24 9147   acetaminophen  (TYLENOL ) tablet 650 mg, 650 mg, Oral, Q6H PRN, 650 mg at 03/02/24 0543 **OR** acetaminophen  (TYLENOL ) suppository 650 mg, 650 mg, Rectal, Q6H PRN, Bryn Bernardino NOVAK, MD   aspirin  EC tablet 81 mg, 81 mg, Oral, Daily, Bryn Bernardino NOVAK, MD, 81 mg at 03/03/24 9148   atorvastatin  (LIPITOR) tablet 10 mg, 10 mg, Oral, Daily, Bryn Bernardino NOVAK, MD, 10 mg at 03/03/24 0851   bupivacaine (PF) (MARCAINE ) 0.5 % injection 10 mL, 10 mL, Infiltration, Once, Juliane Ozell PARAS, PA-C   ceFEPIme  (MAXIPIME ) 2 g in sodium chloride  0.9 % 100 mL IVPB, 2 g, Intravenous, Q M,W,F-HD, Reome, Earle J, RPH, Stopped at 03/02/24 1126   Chlorhexidine  Gluconate Cloth 2 % PADS 6 each, 6 each, Topical, Q0600, Geralynn Charleston, MD, 6 each at 03/03/24 0751   Chlorhexidine  Gluconate Cloth 2 % PADS 6 each, 6 each, Topical, Q0600, Geralynn Charleston, MD, 6 each at 03/03/24 0751   cinacalcet  (SENSIPAR ) tablet 120 mg, 120 mg, Oral, Q breakfast, Stovall, Kathryn R, PA-C, 120 mg at 03/03/24 9250   clopidogrel  (PLAVIX ) tablet 75 mg, 75 mg, Oral, Daily, Magnant, Charles L, PA-C, 75 mg at 03/03/24 9148   cyclobenzaprine  (FLEXERIL ) tablet 10 mg, 10 mg, Oral, TID PRN, Christobal Guadalajara, MD, 10 mg at 03/02/24 2117   Darbepoetin Alfa  (ARANESP ) injection 100 mcg, 100 mcg, Subcutaneous, Q Mon-1800, Gretel Prentice BIRCH, RPH, 100 mcg at 03/02/24 1750   diphenhydrAMINE  (BENADRYL ) capsule 25 mg, 25 mg, Oral, Q6H PRN, Kc, Ramesh, MD   docusate sodium  (COLACE) capsule 100 mg, 100 mg, Oral, BID, Magnant, Charles L, PA-C, 100 mg at 03/03/24 9147   feeding supplement (KATE FARMS STANDARD 1.4) liquid 325 mL, 325 mL, Oral, Daily, Regalado, Belkys A, MD, 325 mL at 03/02/24 1328   ferrous sulfate  tablet 325  mg, 325 mg, Oral, Q breakfast, Regalado, Belkys A, MD, 325 mg at 03/03/24 0749   folic acid  (FOLVITE ) tablet 1 mg, 1 mg, Oral, Daily, Regalado, Belkys A, MD, 1 mg at 03/03/24 9148   gabapentin  (NEURONTIN ) capsule 300 mg, 300 mg, Oral, QHS, Kc, Ramesh, MD, 300 mg at 03/02/24 2117   heparin  sodium (porcine) injection 1,000 Units, 1,000 Units, Intravenous, Once PRN, Dennise Hoes, MD   insulin  aspart (novoLOG ) injection 0-6 Units, 0-6 Units, Subcutaneous, TID WC, Rashid, Farhan, MD   lidocaine  (LIDODERM ) 5 % 1 patch, 1 patch, Transdermal, Q24H, Kc, Ramesh, MD, 1 patch at 02/27/24 1313   lidocaine  (LIDODERM ) 5 % 1 patch, 1 patch, Transdermal, Q24H, Rashid, Farhan, MD, 1 patch at 03/02/24 1735   melatonin tablet 5 mg, 5 mg, Oral, QHS PRN, Christobal Guadalajara, MD, 5 mg at 02/09/24 2125   menthol  (CEPACOL) lozenge 3 mg, 1 lozenge, Oral, PRN **OR** phenol (CHLORASEPTIC) mouth spray 1 spray, 1 spray, Mouth/Throat, PRN, Magnant, Charles L, PA-C   metoCLOPramide  (REGLAN ) tablet 5-10 mg, 5-10 mg, Oral, Q8H PRN **OR** metoCLOPramide  (REGLAN ) injection 5-10 mg, 5-10 mg, Intravenous, Q8H PRN, Magnant, Charles L, PA-C   midodrine  (PROAMATINE ) tablet 10 mg, 10 mg, Oral, PRN, Stovall, Kathryn R, PA-C, 10 mg at 02/29/24 9170   midodrine  (PROAMATINE ) tablet 10 mg, 10 mg, Oral, TID WC, Kc, Ramesh, MD, 10 mg at 03/03/24 0749   multivitamin (RENA-VIT) tablet 1 tablet, 1 tablet, Oral, QHS, Stovall, Kathryn R, PA-C, 1 tablet at 03/02/24 2117   ondansetron  (ZOFRAN ) tablet 4 mg, 4 mg, Oral, Q6H PRN, Magnant, Charles L, PA-C   oxyCODONE -acetaminophen  (PERCOCET/ROXICET) 5-325 MG per tablet 1 tablet, 1  tablet, Oral, Q4H PRN, Danford, Lonni SQUIBB, MD, 1 tablet at 03/03/24 1041   pantoprazole  (PROTONIX ) EC tablet 40 mg, 40 mg, Oral, BID AC, Kc, Ramesh, MD, 40 mg at 03/03/24 0749   psyllium (HYDROCIL/METAMUCIL) 1 packet, 1 packet, Oral, Daily, Guillermina Hamilton, MD, 1 packet at 03/03/24 9147   sevelamer  carbonate (RENVELA ) tablet 2,400 mg,  2,400 mg, Oral, TID WC, Jerrye Katheryn BROCKS, MD, 2,400 mg at 03/03/24 0749   sodium chloride  flush (NS) 0.9 % injection 3 mL, 3 mL, Intravenous, Q12H, Bryn Motto B, MD, 3 mL at 03/03/24 0856   vancomycin  (VANCOCIN ) IVPB 1000 mg/200 mL premix, 1,000 mg, Intravenous, Q M,W,F-HD, Reome, Earle J, RPH, Last Rate: 200 mL/hr at 03/02/24 1026, 1,000 mg at 03/02/24 1026   Patients Current Diet:  Diet Order                  Diet renal with fluid restriction Fluid restriction: 1200 mL Fluid; Room service appropriate? Yes; Fluid consistency: Thin  Diet effective now                       Precautions / Restrictions Precautions Precautions: Fall Precaution/Restrictions Comments: watch HR, R knee buckles Restrictions Weight Bearing Restrictions Per Provider Order: Yes RUE Weight Bearing Per Provider Order: Weight bearing as tolerated RLE Weight Bearing Per Provider Order: Weight bearing as tolerated Other Position/Activity Restrictions: Per Dr. [name] on 1/19 pt can perform ROM and WB through RUE as tolerated    Has the patient had 2 or more falls or a fall with injury in the past year? Yes   Prior Activity Level Limited Community (1-2x/wk): Mod I with RW   Prior Functional Level Self Care: Did the patient need help bathing, dressing, using the toilet or eating? Independent   Indoor Mobility: Did the patient need assistance with walking from room to room (with or without device)? Independent   Stairs: Did the patient need assistance with internal or external stairs (with or without device)? Independent   Functional Cognition: Did the patient need help planning regular tasks such as shopping or remembering to take medications? Independent   Patient Information Are you of Hispanic, Latino/a,or Spanish origin?: A. No, not of Hispanic, Latino/a, or Spanish origin What is your race?: B. Black or African American Do you need or want an interpreter to communicate with a doctor or health care staff?:  0. No   Patient's Response To:  Health Literacy and Transportation Is the patient able to respond to health literacy and transportation needs?: Yes Health Literacy - How often do you need to have someone help you when you read instructions, pamphlets, or other written material from your doctor or pharmacy?: Never In the past 12 months, has lack of transportation kept you from medical appointments or from getting medications?: No In the past 12 months, has lack of transportation kept you from meetings, work, or from getting things needed for daily living?: No   Home Assistive Devices / Equipment Home Equipment: Agricultural Consultant (2 wheels), Grab bars - tub/shower, Government social research officer, Wheelchair - manual   Prior Device Use: Indicate devices/aids used by the patient prior to current illness, exacerbation or injury? Walker and cane   Current Functional Level Cognition   Arousal/Alertness: Awake/alert Overall Cognitive Status: History of cognitive impairments - at baseline Orientation Level: Oriented X4 Attention: Sustained Sustained Attention: Impaired Sustained Attention Impairment: Verbal basic Memory: Impaired Memory Impairment: Decreased short term memory, Decreased recall of new information Decreased  Short Term Memory: Verbal basic Awareness: Appears intact Problem Solving: Appears intact Safety/Judgment: Appears intact    Extremity Assessment (includes Sensation/Coordination)   Upper Extremity Assessment: Defer to OT evaluation RUE Deficits / Details: R shoulder pain with ROM, decreased grasp noted, pt reports issues with R shoulder in the past but now much worse RUE: Shoulder pain with ROM RUE Coordination: decreased gross motor, decreased fine motor  Lower Extremity Assessment: Defer to PT evaluation RLE Deficits / Details: Performs dorsiflexion/plantarflexion within minimal range. Unable to demonstrate quad contraction at this time. Reports numbness in thigh area. Swelling noted. 7/10  pain. NWB at this time.     ADLs   Overall ADL's : Needs assistance/impaired Eating/Feeding: Set up, Bed level Eating/Feeding Details (indicate cue type and reason): d/t impaired FMC in dominant RUE Grooming: Wash/dry hands, Wash/dry face, Oral care, Set up, Supervision/safety, Sitting Grooming Details (indicate cue type and reason): on EOB Upper Body Bathing: Sitting, Minimal assistance Lower Body Bathing: Maximal assistance, Sit to/from stand, +2 for safety/equipment, +2 for physical assistance Upper Body Dressing : Minimal assistance, Sitting Lower Body Dressing: Maximal assistance, +2 for physical assistance, +2 for safety/equipment Toilet Transfer: Moderate assistance, +2 for physical assistance, BSC/3in1, Regular Toilet [employer]) Toilet Transfer Details (indicate cue type and reason): transfer to regular toilet with 3n1 over commode with Stedy and mod assists +2 to stand Toileting- Clothing Manipulation and Hygiene: Moderate assistance Toileting - Clothing Manipulation Details (indicate cue type and reason): mod to max assist for clothing management and patient able to perform toilet hygiene seated Functional mobility during ADLs: +2 for safety/equipment, +2 for physical assistance, Moderate assistance, Maximal assistance (sit>stands only with RW) General ADL Comments: ADL participation impacted by pain     Mobility   Overal bed mobility: Needs Assistance Bed Mobility: Supine to Sit, Sit to Supine Supine to sit: Min assist Sit to supine: Min assist General bed mobility comments: assistance with RLE     Transfers   Overall transfer level: Needs assistance Equipment used: Rolling walker (2 wheels), None Transfers: Sit to/from Stand Sit to Stand: Mod assist, Max assist Bed to/from chair/wheelchair/BSC transfer type:: Via Lift equipment Squat pivot transfers: Max assist, +2 physical assistance, +2 safety/equipment, From elevated surface Step pivot transfers: Mod assist, +2 physical  assistance, +2 safety/equipment, From elevated surface  Lateral/Scoot Transfers: Max assist Transfer via Lift Equipment: Stedy General transfer comment: attempted 3x from EOB, Max A with RW with difficutly getting the RUE up onto the walker. Mod A with face to face sit to stand with pt holding onto therapist arm with R hand and pushing up from EOB with L hand for 2 attempts. Pt was unable to get fully to standing for any trial.     Ambulation / Gait / Stairs / Wheelchair Mobility   Ambulation/Gait Ambulation/Gait assistance: Mod assist, +2 physical assistance Gait Distance (Feet): 0 Feet Assistive device: Rolling walker (2 wheels) Gait Pattern/deviations: Step-to pattern, Decreased step length - right, Decreased step length - left, Decreased stride length, Knees buckling, Trunk flexed General Gait Details: unable at this time. Gait velocity: decreased Gait velocity interpretation: <1.31 ft/sec, indicative of household ambulator Pre-gait activities: Pt took small steps from EOB to recliner with R knee blocked throughout; slight improved with incerased time up but continued to require knee block Mod A +2 for wgt shifting, stepping and balance/LE strength.     Posture / Balance Dynamic Sitting Balance Sitting balance - Comments: EOB Balance Overall balance assessment: Needs assistance Sitting-balance support: Feet supported  Sitting balance-Leahy Scale: Good Sitting balance - Comments: EOB Standing balance support: Bilateral upper extremity supported Standing balance-Leahy Scale: Poor Standing balance comment: UE support and assist for balance in standing     Special considerations/life events  OP hemodialysis MWF [facility] Fall precautions Son would transport to OP hemodialysis for patient was driving self prior to admit    Previous Home Environment  Living Arrangements: Children  Lives With: Son Available Help at Discharge: Family, Available 24 hours/day Type of Home: Apartment Home  Layout: One level Home Access: Level entry Bathroom Shower/Tub: Engineer, Manufacturing Systems: Standard (+ toilet riser) Bathroom Accessibility: Yes How Accessible: Accessible via walker Home Care Services: No   Discharge Living Setting Plans for Discharge Living Setting: Patient's home, Apartment, Lives with (comment) (son) Type of Home at Discharge: Apartment Discharge Home Layout: One level Discharge Home Access: Level entry Discharge Bathroom Shower/Tub: Tub/shower unit Discharge Bathroom Toilet: Standard Discharge Bathroom Accessibility: Yes How Accessible: Accessible via walker Does the patient have any problems obtaining your medications?: No Different apartment since last admit to CIR. At that time he was on third level, but now first level with level entry.   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 Went to SNF afterwards and stayed only 1 to 2 weeks before returning home Pt/Family Agrees to Admission and willing to participate: Yes Program Orientation Provided & Reviewed with Pt/Caregiver Including Roles  & Responsibilities: Yes   Decrease burden of Care through IP rehab admission: n/a   Possible need for SNF placement upon discharge: not anticipated. Last CIR admit he discharged to SNF  for less than 2 weeks.Plan is direct discharge home.   Patient Condition: This patient's medical and functional status has changed since the consult dated: 02/10/25 in which the Rehabilitation Physician determined and documented that the patient's condition is appropriate for intensive rehabilitative care in an inpatient rehabilitation facility. See History of Present Illness (above) for medical update. Functional changes are: mod to max assist overall. Patient's medical and functional status update has been discussed with the Rehabilitation physician and patient remains appropriate for inpatient rehabilitation. Will admit to inpatient rehab today.   Preadmission Screen Completed By:  Alison Heron Lot, RN MSN 03/03/2024 11:37 AM ______________________________________________________________________   Discussed status with Dr. Babs on 03/03/24 at 1059 and received approval for admission today.   Admission Coordinator:  Alison Heron Lot, RN MSN time 1059 Date 03/03/24    Assessment/Plan: Diagnosis: debility Does the need for close, 24 hr/day Medical supervision in concert with the patient's rehab needs make it unreasonable for this patient to be served in a less intensive setting? Yes Co-Morbidities requiring supervision/potential complications: septic jts, pain, esrd on hd Due to bladder management, bowel management, safety, skin/wound care, disease management, medication administration, pain management, and patient education, does the patient require 24 hr/day rehab nursing? Yes Does the patient require coordinated care of a physician, rehab  nurse, PT, OT, and SLP to address physical and functional deficits in the context of the above medical diagnosis(es)? Yes Addressing deficits in the following areas: balance, endurance, locomotion, strength, transferring, bowel/bladder control, bathing, dressing, feeding, grooming, toileting, and psychosocial support Can the patient actively  participate in an intensive therapy program of at least 3 hrs of therapy 5 days a week? Yes The potential for patient to make measurable gains while on inpatient rehab is excellent Anticipated functional outcomes upon discharge from inpatient rehab: min assist PT, min assist OT, n/a SLP Estimated rehab length of stay to reach the above functional goals is: 2-3 weeks Anticipated discharge destination: Home 10. Overall Rehab/Functional Prognosis: excellent     MD Signature: Arthea IVAR Gunther, MD, [facility] Physical Medicine & Rehabilitation Medical Director Rehabilitation Services 03/03/2024            Revision History  Date/Time User Provider Type Action  03/03/2024 11:55 AM Gunther Arthea DASEN, MD Physician Sign  03/03/2024 11:37 AM Alison Heron MATSU, RN Rehab Admission Coordinator Share  03/03/2024 11:00 AM Alison Heron MATSU, RN Rehab Admission Coordinator Share  03/03/2024 10:50 AM Alison Heron MATSU, RN Rehab Admission Coordinator Share  03/03/2024  9:36 AM Alison Heron MATSU, RN Rehab Admission Coordinator Share  02/25/2024  4:03 PM Alison Heron MATSU, RN Rehab Admission Coordinator Share   View Details Report        "

## 2024-03-03 NOTE — TOC Progression Note (Signed)
 Transition of Care (TOC) - Progression Note    Patient Details  Name: [name]. MRN: [id number] Date of Birth: [birth year]  Transition of Care [facility]) CM/SW Contact  Sherline Clack, [place] Phone Number: 03/03/2024, 9:33 AM  Clinical Narrative:     Patient's auth for CIR was approved 1/14-1/20. CSW reached out to rehab admissions coordinators yesterday (1/20) to ask if patient would be able to admit to CIR now that insurance has approved. CIR admissions coordinator Heron will come to assess patient.   Expected Discharge Plan: Skilled Nursing Facility Barriers to Discharge: SNF Pending bed offer               Expected Discharge Plan and Services       Living arrangements for the past 2 months: Single Family Home                                       Social Drivers of Health (SDOH) Interventions SDOH Screenings   Food Insecurity: No Food Insecurity (02/10/2024)  Housing: Low Risk (02/10/2024)  Transportation Needs: No Transportation Needs (02/10/2024)  Utilities: Not At Risk (02/10/2024)  Depression (PHQ2-9): Medium Risk (08/19/2023)  Social Connections: Socially Isolated (02/10/2024)  Tobacco Use: Medium Risk (02/28/2024)    Readmission Risk Interventions    05/30/2023   12:03 PM  Readmission Risk Prevention Plan  Transportation Screening Complete  HRI or Home Care Consult Complete  Social Work Consult for Recovery Care Planning/Counseling Complete  Palliative Care Screening Not Applicable  Medication Review Oceanographer) Complete

## 2024-03-04 DIAGNOSIS — N186 End stage renal disease: Secondary | ICD-10-CM | POA: Diagnosis not present

## 2024-03-04 DIAGNOSIS — R5381 Other malaise: Secondary | ICD-10-CM | POA: Diagnosis not present

## 2024-03-04 DIAGNOSIS — L89150 Pressure ulcer of sacral region, unstageable: Secondary | ICD-10-CM

## 2024-03-04 DIAGNOSIS — M009 Pyogenic arthritis, unspecified: Secondary | ICD-10-CM | POA: Diagnosis not present

## 2024-03-04 LAB — RENAL FUNCTION PANEL
Albumin: 2.9 g/dL — ABNORMAL LOW (ref 3.5–5.0)
Anion gap: 14 (ref 5–15)
BUN: 55 mg/dL — ABNORMAL HIGH (ref 8–23)
CO2: 27 mmol/L (ref 22–32)
Calcium: 9.9 mg/dL (ref 8.9–10.3)
Chloride: 88 mmol/L — ABNORMAL LOW (ref 98–111)
Creatinine, Ser: 7.84 mg/dL — ABNORMAL HIGH (ref 0.61–1.24)
GFR, Estimated: 7 mL/min — ABNORMAL LOW
Glucose, Bld: 85 mg/dL (ref 70–99)
Phosphorus: 3.8 mg/dL (ref 2.5–4.6)
Potassium: 4.8 mmol/L (ref 3.5–5.1)
Sodium: 130 mmol/L — ABNORMAL LOW (ref 135–145)

## 2024-03-04 LAB — AEROBIC/ANAEROBIC CULTURE W GRAM STAIN (SURGICAL/DEEP WOUND)
Culture: NO GROWTH
Culture: NO GROWTH
Culture: NO GROWTH
Gram Stain: NONE SEEN
Gram Stain: NONE SEEN
Gram Stain: NONE SEEN

## 2024-03-04 LAB — GLUCOSE, CAPILLARY
Glucose-Capillary: 86 mg/dL (ref 70–99)
Glucose-Capillary: 78 mg/dL (ref 70–99)
Glucose-Capillary: 78 mg/dL (ref 70–99)
Glucose-Capillary: 83 mg/dL (ref 70–99)

## 2024-03-04 MED ORDER — HEPARIN SODIUM (PORCINE) 1000 UNIT/ML IJ SOLN
INTRAMUSCULAR | Status: AC
  Filled 2024-03-04: qty 3

## 2024-03-04 MED ORDER — SELENIUM SULFIDE 1 % EX LOTN
1.0000 | TOPICAL_LOTION | Freq: Every day | CUTANEOUS | Status: AC
  Administered 2024-03-07 – 2024-03-17 (×8): 1 via TOPICAL
  Filled 2024-03-04 (×2): qty 207

## 2024-03-04 MED ORDER — PENTAFLUOROPROP-TETRAFLUOROETH EX AERO: 1.0000 | INHALATION_SPRAY | CUTANEOUS | Status: DC | PRN

## 2024-03-04 MED ORDER — ALTEPLASE 2 MG IJ SOLR: 2.0000 mg | Freq: Once | INTRAMUSCULAR | Status: DC | PRN

## 2024-03-04 MED ORDER — CHLORHEXIDINE GLUCONATE CLOTH 2 % EX PADS
6.0000 | MEDICATED_PAD | Freq: Every day | CUTANEOUS | Status: DC
  Administered 2024-03-05: 6 via TOPICAL

## 2024-03-04 MED ORDER — HEPARIN SODIUM (PORCINE) 1000 UNIT/ML IJ SOLN
3000.0000 [IU] | Freq: Once | INTRAMUSCULAR | Status: AC
  Administered 2024-03-04: 3000 [IU] via INTRAVENOUS

## 2024-03-04 MED ORDER — LIDOCAINE HCL (PF) 1 % IJ SOLN: 5.0000 mL | INTRAMUSCULAR | Status: DC | PRN

## 2024-03-04 MED ORDER — HEPARIN SODIUM (PORCINE) 1000 UNIT/ML DIALYSIS: 1000.0000 [IU] | INTRAMUSCULAR | Status: DC | PRN

## 2024-03-04 MED ORDER — JUVEN PO PACK
1.0000 | PACK | Freq: Two times a day (BID) | ORAL | Status: DC
  Administered 2024-03-04 – 2024-03-15 (×16): 1 via ORAL
  Filled 2024-03-04 (×18): qty 1

## 2024-03-04 MED ORDER — LIDOCAINE-PRILOCAINE 2.5-2.5 % EX CREA: 1.0000 | TOPICAL_CREAM | CUTANEOUS | Status: DC | PRN

## 2024-03-04 NOTE — Evaluation (Signed)
 Physical Therapy Assessment and Plan  Patient Details  Name: Carl Mirabile Sr. MRN: [id number] Date of Birth: 01-18-1961  PT Diagnosis: Abnormal posture, Abnormality of gait, Difficulty walking, Impaired sensation, and Muscle weakness Rehab Potential: Fair ELOS: 2-3 weeks   {CHL IP REHAB PT TIME CALCULATION:[id number]}   Hospital Problem: Principal Problem:   Debility   Past Medical History:  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    Parathyroid  abnormality    ectopic parathyroid  gland   Presence of arteriovenous fistula for hemodialysis, primary    RUE PER PT RLE   Refusal of blood product    NO WHOLE BLOOD PROUCTS   Renal cell carcinoma (HCC)    s/p hand assisted laparoscopic bilateral nephrectomies 11/29/17, + RCC left   Secondary hyperparathyroidism    Seizures (HCC)    one episode in past, due to elevated Potassium 08/02/20- at least 4 years ago   Sleep apnea    doesn't use CPAP anymore since weight loss   Stroke [facility])    no residual   Past Surgical History:  Past Surgical History:  Procedure Laterality Date   ABDOMINAL AORTOGRAM W/LOWER EXTREMITY N/A 12/09/2023   Procedure: ABDOMINAL AORTOGRAM W/LOWER EXTREMITY;  Surgeon: Sheree Penne Bruckner, MD;  Location: [facility] INVASIVE CV LAB;  Service: Cardiovascular;  Laterality: N/A;   ANTERIOR CERVICAL CORPECTOMY N/A 05/25/2023   Procedure: ANTERIOR CERVICAL CORPECTOMY CERVICAL FOUR, CERVICAL  THREE - CERVICAL FIVE FUSION;  Surgeon: [name] Shove, MD;  Location: [facility] OR;  Service: Neurosurgery;  Laterality: N/A;   AV FISTULA PLACEMENT Right    right arm   BIOPSY  08/01/2019   Procedure: BIOPSY;  Surgeon: Kristie Lamprey, MD;  Location: [facility] ENDOSCOPY;  Service: Endoscopy;;   BIOPSY  11/18/2020   Procedure: BIOPSY;  Surgeon: Stacia Glendia BRAVO, MD;  Location: [facility] ENDOSCOPY;  Service: Gastroenterology;;   BIOPSY  09/13/2021   Procedure: BIOPSY;  Surgeon: Wilhelmenia Aloha Raddle., MD;  Location: [facility] ENDOSCOPY;  Service: Gastroenterology;;   CATARACT EXTRACTION W/ INTRAOCULAR LENS  IMPLANT, BILATERAL     COLON SURGERY     COLONOSCOPY N/A 08/04/2015   Procedure: COLONOSCOPY;  Surgeon: Gordy CHRISTELLA Starch, MD;  Location: [facility] ENDOSCOPY;  Service: Endoscopy;  Laterality: N/A;   COLONOSCOPY  2017   JMP@ [facility]-good prep-mass -recall 1 yr   COLONOSCOPY N/A 09/13/2021   Procedure: COLONOSCOPY;  Surgeon: Mansouraty, Aloha Raddle., MD;  Location: [facility] ENDOSCOPY;  Service: Gastroenterology;  Laterality: N/A;   COLONOSCOPY WITH PROPOFOL  N/A 11/25/2020   Procedure: COLONOSCOPY WITH PROPOFOL ;  Surgeon: Federico Rosario BROCKS, MD;  Location: [facility] ENDOSCOPY;  Service: Gastroenterology;  Laterality: N/A;   COLONOSCOPY WITH PROPOFOL  N/A 09/23/2021   Procedure: COLONOSCOPY WITH PROPOFOL ;  Surgeon: San Sandor GAILS, DO;  Location: [facility] ENDOSCOPY;  Service: Gastroenterology;  Laterality: N/A;  ENTEROSCOPY N/A 09/23/2021   Procedure: ENTEROSCOPY;  Surgeon: San Sandor GAILS, DO;  Location: [facility] ENDOSCOPY;  Service: Gastroenterology;  Laterality: N/A;  Will place order for video capsule study as we may opt to place it during procedure   ESOPHAGOGASTRODUODENOSCOPY N/A 08/01/2019   Procedure: ESOPHAGOGASTRODUODENOSCOPY (EGD);  Surgeon: Kristie Lamprey, MD;  Location: [facility] ENDOSCOPY;  Service: Endoscopy;  Laterality: N/A;   ESOPHAGOGASTRODUODENOSCOPY N/A 11/18/2020   Procedure: ESOPHAGOGASTRODUODENOSCOPY (EGD);  Surgeon: Stacia Glendia BRAVO, MD;   Location: [facility] ENDOSCOPY;  Service: Gastroenterology;  Laterality: N/A;   ESOPHAGOGASTRODUODENOSCOPY (EGD) WITH PROPOFOL  N/A 08/04/2019   Procedure: ESOPHAGOGASTRODUODENOSCOPY (EGD) WITH PROPOFOL ;  Surgeon: Leigh Elspeth SQUIBB, MD;  Location: [facility] ENDOSCOPY;  Service: Gastroenterology;  Laterality: N/A;   ESOPHAGOGASTRODUODENOSCOPY (EGD) WITH PROPOFOL  N/A 09/13/2021   Procedure: ESOPHAGOGASTRODUODENOSCOPY (EGD) WITH PROPOFOL ;  Surgeon: Wilhelmenia Aloha Raddle., MD;  Location: [facility] ENDOSCOPY;  Service: Gastroenterology;  Laterality: N/A;   GIVENS CAPSULE STUDY N/A 09/23/2021   Procedure: GIVENS CAPSULE STUDY;  Surgeon: San Sandor GAILS, DO;  Location: [facility] ENDOSCOPY;  Service: Gastroenterology;  Laterality: N/A;   graft left arm Left    for dialysis x 2. Removed   HARDWARE REMOVAL Right 02/28/2024   Procedure: RIGHT SHOULDER ARTHROSCOPIC WASHOUT AND PLACEMENT OF ANTIBIOTIC BEADS;  Surgeon: [name] Cordella Glendia, MD;  Location: [facility] OR;  Service: Orthopedics;  Laterality: Right;  RIGHT SHOULDER HARDWARE REMOVAL, PLACEMENT OF ANTIBIOTIC SPACER   HOT HEMOSTASIS  11/18/2020   Procedure: HOT HEMOSTASIS (ARGON PLASMA COAGULATION/BICAP);  Surgeon: Stacia Glendia BRAVO, MD;  Location: [facility] ENDOSCOPY;  Service: Gastroenterology;;   HOT HEMOSTASIS N/A 09/23/2021   Procedure: HOT HEMOSTASIS (ARGON PLASMA COAGULATION/BICAP);  Surgeon: San Sandor GAILS, DO;  Location: [facility] ENDOSCOPY;  Service: Gastroenterology;  Laterality: N/A;   INSERTION OF DIALYSIS CATHETER     Rt chest   IR US  GUIDE BX ASP/DRAIN  02/25/2024   KNEE ARTHROSCOPY Right 02/21/2024   Procedure: ARTHROSCOPY, KNEE;  Surgeon: [name] Kay HERO, MD;  Location: [facility] OR;  Service: Orthopedics;  Laterality: Right;   LAPAROSCOPIC RIGHT COLECTOMY N/A 08/05/2015   Procedure: LAPAROSCOPIC RIGHT COLECTOMY- ASCENDING;  Surgeon: Jina Nephew, MD;  Location: [facility] OR;  Service: General;  Laterality: N/A;   LOWER EXTREMITY ANGIOGRAPHY N/A 12/09/2023   Procedure: Lower Extremity  Angiography;  Surgeon: Sheree Penne Bruckner, MD;  Location: [facility] INVASIVE CV LAB;  Service: Cardiovascular;  Laterality: N/A;   LOWER EXTREMITY INTERVENTION N/A 12/09/2023   Procedure: LOWER EXTREMITY INTERVENTION;  Surgeon: Sheree Penne Bruckner, MD;  Location: [facility] INVASIVE CV LAB;  Service: Cardiovascular;  Laterality: N/A;   MASS EXCISION Left 05/28/2019   Procedure: EXCISION SOFT TISSUE MASS LEFT SHOULDER;  Surgeon: Eletha Boas, MD;  Location: [facility] ORS;  Service: General;  Laterality: Left;   NEPHRECTOMY Bilateral    PARATHYROIDECTOMY N/A 06/12/2016   Procedure: TOTAL PARATHYROIDECTOMY WITH AUTOTRANSPLANTATION TO LEFT FOREARM;  Surgeon: Boas Eletha, MD;  Location: [facility] OR;  Service: General;  Laterality: N/A;   PARATHYROIDECTOMY N/A 10/23/2016   Procedure: PARATHYROIDECTOMY;  Surgeon: Eletha Boas, MD;  Location: [facility] OR;  Service: General;  Laterality: N/A;   REVERSE SHOULDER ARTHROPLASTY Right 08/24/2020   Procedure: REVERSE SHOULDER ARTHROPLASTY;  Surgeon: Cristy Bonner DASEN, MD;  Location: [facility] OR;  Service: Orthopedics;  Laterality: Right;   REVISON OF ARTERIOVENOUS FISTULA Right 07/16/2017   Procedure: REVISION OF ARTERIOVENOUS FISTULA  Right ARM;  Surgeon: Sheree Penne Bruckner, MD;  Location: [facility] OR;  Service: Vascular;  Laterality: Right;   TOTAL HIP ARTHROPLASTY Left 11/14/2020   Procedure: LEFT  TOTAL HIP ARTHROPLASTY ANTERIOR APPROACH;  Surgeon: [name] Kay HERO, MD;  Location: [facility] OR;  Service: Orthopedics;  Laterality: Left;   UPPER GASTROINTESTINAL ENDOSCOPY  2021   @ [facility]    Assessment & Plan Clinical Impression: Patient is a 64 y.o. 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 He also reported increase in right sided weakness and NS consulted. MRI C spine done showing mild stenosis at C3-C4 and not cord compression--no neurosurgical intervention needed per Dr. Colon. MRI brain negative for stroke but showed large bilateral mastoid effusions and Dr. Merrianne recommended ENT consult due to concerns of mastoiditis.[name] consulted who felt that CT without evidence of coalescent mastoiditis and exam consistent with left suppurative otitis media and right EAC occluded with cerumen. He  recommended was treated with Zosyn  and ciprodex  drops X 14 days. SABRA    He continued to have limitation due to weakness and right shoulder pain. MRI right shoulder showed fluid collection 5.1 X 2.9 X 1.9 in posterior subacromial/subdeltoid space could be seroma and severe fatty atrophy of subcapularis, supraspinatus and infraspinatus muscles. MRI lumbar spine revealed L3-L4 moderate right and mild left foraminal stenosis, moderate right L4-L5 stenosis, right lateral L3-L4 disc edema with mild surrounding marrow edema and severe degenerative changes.  MRI thoracic spine revealed severe left foraminal stenosis T11-T12, moderate right foraminal stenosis T2-T3, T8-T9 and T9-T10 and T11 and T12 endplate edema question degenerative or acute/subacute Fx. Dr. Onetha reviewed films and felt X rays with degenerative changes and no acute Fx. Right sided weakness felt to be recrudescence of SCI symptoms in setting of infection.    Blood cultures negative. Right knee showed tricompartmental OA. Ortho re-consulted due to on onging knee pain and knee aspirated showing 71,000 WBC with 94 % neutrophils and no organisms. 2 D echo done revealing EF 65-70%, moderate ot severe mitral calcification and linear calcified mass on AV with moderate thickening and mild to moderate AVR. Cardiology felt that patient with degenerative changes on valves  and intermediate cardiac risk. He underwent I and D right knee by Dr. [name] on 01/09 and scant purulent synovial fluid encountered. Dr. Fleeta Rothman consulted and recommended Cefepime  and Vanc with HD.  He spiked fever on 01/12 and Dr. Fleeta Rothman recommended evaluation of right shoulder fluid collection which was aspirated X 2 showing  64,000 WBC and  93,5000 WBC but  no organisms seen.  He underwent I and D right shoulder with placement of antibiotic beads (instead of liner replacement due to anemia/Jehovah's witness) by Dr. [name] on 01/16 with recommendations to aspirate shoulder in 8 weeks.    Patient currently requires max with mobility secondary to muscle weakness and muscle joint tightness, decreased cardiorespiratoy endurance, unbalanced muscle activation, decreased coordination, and decreased motor planning, and decreased sitting balance, decreased standing balance, decreased postural control, and decreased balance strategies.  Prior to hospitalization, patient was modified independent  with mobility and lived with Son in a Apartment home.  Home access is  Level entry.  Patient will benefit from skilled PT intervention to maximize safe functional mobility, minimize fall risk, and decrease caregiver burden for planned discharge home  with 24 hour supervision.  Anticipate patient will benefit from follow up HH at discharge.  PT - End of Session Activity Tolerance: Tolerates 30+ min activity with multiple rests Endurance Deficit: Yes PT Assessment Rehab Potential (ACUTE/IP ONLY): Fair PT Barriers to Discharge: Hemodialysis;Lack of/limited family support;Wound Care PT Barriers to Discharge Comments: pain, dizziness, PT Patient demonstrates impairments in the following area(s): Balance;Endurance;Motor;Pain;Sensory;Skin Integrity PT Transfers Functional Problem(s): Bed Mobility;Bed to Chair;Car;Furniture PT Locomotion Functional Problem(s): Ambulation PT Plan PT Intensity: Minimum of 1-2 x/day ,45 to 90  minutes PT Frequency: 5 out of 7 days PT Duration Estimated Length of Stay: 2-3 weeks PT Treatment/Interventions: Ambulation/gait training;Discharge planning;Functional mobility training;Psychosocial support;Therapeutic Activities;Visual/perceptual remediation/compensation;Balance/vestibular training;Disease management/prevention;Neuromuscular re-education;Skin care/wound management;Therapeutic Exercise;Wheelchair propulsion/positioning;Cognitive remediation/compensation;DME/adaptive equipment instruction;Pain management;Splinting/orthotics;UE/LE Strength taining/ROM;Community reintegration;Patient/family education;Stair training;UE/LE Coordination activities PT Transfers Anticipated Outcome(s): supervision PT Locomotion Anticipated Outcome(s): CGA PT Recommendation Recommendations for Other Services: Therapeutic Recreation consult Follow Up Recommendations: Home health PT;Outpatient PT Patient destination: Home Equipment Recommended: Other (comment) Equipment Details: pt has all DME needs from previous CIR stay   PT Evaluation Precautions/Restrictions Precautions Precautions: Fall Precaution/Restrictions Comments: watch HR, R knee buckles, spinal Restrictions Weight Bearing Restrictions Per Provider Order: Yes RUE Weight Bearing Per Provider Order: Weight bearing as tolerated RLE Weight Bearing Per Provider Order: Weight bearing as tolerated Other Position/Activity Restrictions: Per Dr. [name] on 1/19 pt can perform ROM and WB through RUE as tolerated Pain Interference Pain Interference Pain Effect on Sleep: 4. Almost constantly Pain Interference with Therapy Activities: 4. Almost constantly Pain Interference with Day-to-Day Activities: 4. Almost constantly Home Living/Prior Functioning Home Living Available Help at Discharge: Family;Available 24 hours/day Type of Home: Apartment Home Access: Level entry Home Layout: One level Bathroom Shower/Tub: Engineer, Manufacturing Systems:  Standard (toilet riser) Bathroom Accessibility: Yes  Lives With: Son Prior Function Level of Independence: Independent with basic ADLs;Independent with homemaking with ambulation;Independent with transfers;Independent with gait  Able to Take Stairs?: No Driving: Yes Vision/Perception  Vision - History Ability to See in Adequate Light: 0 Adequate Perception Perception: Within Functional Limits Praxis Praxis: WFL  Cognition Overall Cognitive Status: Within Functional Limits for tasks assessed Arousal/Alertness: Awake/alert Orientation Level: Oriented X4 Sustained Attention: Appears intact Memory: Appears intact Awareness: Appears intact Problem Solving: Appears intact Safety/Judgment: Appears intact Sensation Sensation Light Touch: Impaired by gross assessment Additional Comments: intermittent numbness/tingling from knees down Coordination Gross Motor Movements are Fluid and Coordinated: No Fine Motor Movements are Fluid and Coordinated: No Coordination and Movement Description: 2/2 debility Motor  Motor Motor: Other (comment);Abnormal postural alignment and control Motor - Skilled Clinical Observations: prior SCI, also 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  Trunk/Postural Assessment  Cervical Assessment Cervical Assessment: Exceptions to [facility] (forward head) Thoracic Assessment Thoracic Assessment: Exceptions to [facility] (rounded shoulders) Lumbar Assessment Lumbar Assessment: Exceptions to [facility] (posterior pelvic tilt) Postural Control Postural Control: Deficits on evaluation Righting Reactions: delayed Protective Responses: inadequate  Balance Balance Balance Assessed: Yes Static Sitting Balance Static Sitting - Balance Support: Feet supported;No upper extremity supported Static Sitting - Level of Assistance: 5: Stand by assistance Dynamic Sitting Balance Dynamic Sitting - Balance Support: During functional activity Dynamic Sitting - Level of Assistance: 5: Stand by assistance Static  Standing Balance Static Standing - Balance Support: Bilateral upper extremity supported Static Standing - Level of Assistance: 3: Mod assist Dynamic Standing Balance Dynamic Standing - Balance Support: During functional activity;Bilateral upper extremity supported Dynamic Standing - Level of Assistance: 2: Max assist Extremity Assessment  RLE Assessment RLE Assessment: Exceptions to [facility] General  Strength Comments: grossly 2/5, limited by pain LLE Assessment LLE Assessment: Exceptions to [facility] General Strength Comments: grossly 3/5  Care Tool Care Tool Bed Mobility Roll left and right activity   Roll left and right assist level: Moderate Assistance - Patient 50 - 74%    Sit to lying activity   Sit to lying assist level: Moderate Assistance - Patient 50 - 74%    Lying to sitting on side of bed activity   Lying to sitting on side of bed assist level: the ability to move from lying on the back to sitting on the side of the bed with no back support.: Moderate Assistance - Patient 50 - 74%     Care Tool Transfers Sit to stand transfer   Sit to stand assist level: Maximal Assistance - Patient 25 - 49%    Chair/bed transfer   Chair/bed transfer assist level: Dependent - Patient 0% (steady)    Licensed Conveyancer transfer activity did not occur: Safety/medical concerns        Care Tool Locomotion Ambulation Ambulation activity did not occur: Safety/medical concerns        Walk 10 feet activity Walk 10 feet activity did not occur: Safety/medical concerns       Walk 50 feet with 2 turns activity Walk 50 feet with 2 turns activity did not occur: Safety/medical concerns      Walk 150 feet activity Walk 150 feet activity did not occur: Safety/medical concerns      Walk 10 feet on uneven surfaces activity Walk 10 feet on uneven surfaces activity did not occur: Safety/medical concerns      Stairs Stair activity did not occur: Safety/medical concerns        Walk up/down 1 step  activity Walk up/down 1 step or curb (drop down) activity did not occur: Safety/medical concerns      Walk up/down 4 steps activity Walk up/down 4 steps activity did not occur: Safety/medical concerns      Walk up/down 12 steps activity Walk up/down 12 steps activity did not occur: Safety/medical concerns      Pick up small objects from floor   Pick up small object from the floor assist level: Total Assistance - Patient < 25%    Wheelchair Is the patient using a wheelchair?: Yes Type of Wheelchair: Manual   Wheelchair assist level: Supervision/Verbal cueing Max wheelchair distance: 10 feet  Wheel 50 feet with 2 turns activity   Assist Level: Total Assistance - Patient < 25%  Wheel 150 feet activity   Assist Level: Total Assistance - Patient < 25%    Refer to Care Plan for Long Term Goals  SHORT TERM GOAL WEEK 1 PT Short Term Goal 1 (Week 1): pt will perform sit to stand with RW and max A PT Short Term Goal 2 (Week 1): pt will perform bed to chair transfer with LRAD and max A PT Short Term Goal 3 (Week 1): Pt will tolerate standing upright for ~ 2 min  Recommendations for other services: Therapeutic Recreation  Stress management  Skilled Therapeutic Intervention Mobility Bed Mobility Bed Mobility: Supine to Sit;Sitting - Scoot to Edge of Bed Supine to Sit: Minimal Assistance - Patient > 75% Sitting - Scoot to [place] of Bed: Contact Guard/Touching assist Transfers Transfers: Sit to Stand;Stand to Sit Sit to Stand: Moderate Assistance - Patient 50-74%;Maximal Assistance - Patient 25-49% (steady) Stand to Sit: Moderate Assistance - Patient 50-74%;Maximal Assistance - Patient 25-49% Transfer (Assistive device): Other (Comment) Transfer via  Lift Equipment: Engineering Geologist: No Gait Gait: No Stairs / Additional Locomotion Stairs: No Corporate Treasurer: Yes Wheelchair Assistance: Doctor, General Practice: Both  upper extremities Wheelchair Parts Management: Supervision/cueing Distance: 10 feet   Discharge Criteria: Patient will be discharged from PT if patient refuses treatment 3 consecutive times without medical reason, if treatment goals not met, if there is a change in medical status, if patient makes no progress towards goals or if patient is discharged from hospital.  The above assessment, treatment plan, treatment alternatives and goals were discussed and mutually agreed upon: by patient  Today's Interventions  Pt supine in bed upon arrival. Pt agreeable to therapy. Pt endorses 7/10 pain in R shoulder, and R knee, premedicated. Pt provided rest breaks and repositioning as needed.   Evaluation completed (see details above and below) with education on PT POC and goals and individual treatment initiated with focus on transfer training.   Bed mobility: mod A without bed features, min A with use of bed features.   Pt performed steady transfer with mod-max A +1 bed to standard WC, standard WC to PWC, PWC to bed, pt demonstrates heavy trunk flexion, verbal cues provided for upright posture. Pt able to achieve upright positioning but pt heavily biasing towards L LE, with R LE in flexed position.   Pt requires HOH assistance for R UE/R LE for placement onto steady throughout session.   Treatment session focused on providing pt with appropriate chair. Pt has 18x18 standard WC in room with roho cushion. Pt initially requesting different chair 2/2 it being too low. PT provided pt with roho cushion however pt refusing to get in [facility] that he is unable to self propel. Pt requesting to trail standard chair. Pt in standard 18x18 WC however demonstrates poor positioning and only able to self propel x10 feet 2/2 pain/weakness/debility. PT opted to provide pt with PWC. Pt would benefit from leg rests on PWC being extended. Did not stay in [facility] for long as pt requesting to return to bed at end of session for dialysis.    Pt supine in bed with all needs within reach and bed alarm on.    [facility], [place], DPT  03/04/2024, 8:48 PM

## 2024-03-04 NOTE — Progress Notes (Signed)
 Patient ID: Carl Petrow Sr., male   DOB: 12/25/1960, 64 y.o.   MRN: [id number]  SW unable to enter assessment due to screen charge to dialysis and CIR tab no longer available.   1357- SW spoke with pt son Marcell to introduce self, explain role, and discuss discharge process. SW discussed his needs in regards to transportation to dialysis since pt chair time is 5am. He reports he will take him. SW offered [employer] [place] application for transportation to home if he is unable to do this. He is aware SW will follow-up with [facility] tomorrow.   [name], MSW, LCSW Office: [phone] Cell: [phone] Fax: [phone]

## 2024-03-04 NOTE — Progress Notes (Signed)
 "                                                        PROGRESS NOTE   Subjective/Complaints: Pt had a reasonable night. Just ate some breakfast in expectation of therapy at 0800. Asked for pain medicine too  ROS: Patient denies fever, rash, sore throat, blurred vision, dizziness, nausea, vomiting, diarrhea, cough, shortness of breath or chest pain,  headache, or mood change.    Objective:   No results found. No results for input(s): WBC, HGB, HCT, PLT in the last 72 hours. Recent Labs    03/03/24 0149 03/04/24 0534  NA 132* 130*  K 4.5 4.8  CL 90* 88*  CO2 29 27  GLUCOSE 92 85  BUN 38* 55*  CREATININE 5.87* 7.84*  CALCIUM  9.4 9.9    Intake/Output Summary (Last 24 hours) at 03/04/2024 9171 Last data filed at 03/03/2024 1835 Gross per 24 hour  Intake 240 ml  Output --  Net 240 ml     Wound 03/03/24 0330 Pressure Injury Sacrum Left Unstageable - Full thickness tissue loss in which the base of the injury is covered by slough (yellow, tan, gray, green or brown) and/or eschar (tan, brown or black) in the wound bed. (Active)    Physical Exam: Vital Signs Blood pressure 118/70, pulse 79, temperature 98.1 F (36.7 C), temperature source Oral, resp. rate 18, height 6' 2 (1.88 m), weight 86 kg, SpO2 98%.  General: Alert and oriented x 3, No apparent distress HEENT: Head is normocephalic, atraumatic, PERRLA, EOMI, sclera anicteric, oral mucosa pink and moist, dentition intact, ext ear canals clear,  Neck: Supple without JVD or lymphadenopathy Heart: Reg rate and rhythm. No murmurs rubs or gallops Chest: CTA bilaterally without wheezes, rales, or rhonchi; no distress Abdomen: Soft, non-tender, non-distended, bowel sounds positive. Extremities: No clubbing, cyanosis, or edema. Pulses are 2+ Psych: Pt's affect is appropriate. Pt is cooperative Skin: scalp with dry skin, denuded area where he's scratched. Healing wounds on right shoulder and knee. Sacral wound as  below  Neuro:  Alert and oriented x 3. Normal insight and awareness. Intact Memory. Normal language and speech. Cranial nerve exam unremarkable. MMT: LUE 5/5/ RUE limited proximally due to pain but 5/5 distally. RLE 2/5 HF,KE pain, 4+ ankle. LLE 4/5 prox to 5/5 distal. Decreased LT in feet.   Musculoskeletal: right knee remains swollen, tender. Right shoulder similar.     Assessment/Plan: 1. Functional deficits which require 3+ hours per day of interdisciplinary therapy in a comprehensive inpatient rehab setting. Physiatrist is providing close team supervision and 24 hour management of active medical problems listed below. Physiatrist and rehab team continue to assess barriers to discharge/monitor patient progress toward functional and medical goals  Care Tool:  Bathing              Bathing assist       Upper Body Dressing/Undressing Upper body dressing        Upper body assist      Lower Body Dressing/Undressing Lower body dressing            Lower body assist       Toileting Toileting    Toileting assist       Transfers Chair/bed transfer  Transfers assist  Locomotion Ambulation   Ambulation assist              Walk 10 feet activity   Assist           Walk 50 feet activity   Assist           Walk 150 feet activity   Assist           Walk 10 feet on uneven surface  activity   Assist           Wheelchair     Assist               Wheelchair 50 feet with 2 turns activity    Assist            Wheelchair 150 feet activity     Assist          Blood pressure 118/70, pulse 79, temperature 98.1 F (36.7 C), temperature source Oral, resp. rate 18, height 6' 2 (1.88 m), weight 86 kg, SpO2 98%.  Medical Problem List and Plan: 1. Functional deficits secondary to septic right knee and shoulder             -patient may shower if incisons are covered             -[facility]/Goals:  15-20 days, min assist goals with PT and OT, W/C level?  -Patient is beginning CIR therapies today including PT and OT  2.  Antithrombotics:  -DVT/anticoagulation:  Pharmaceutical: Heparin              -antiplatelet therapy: ASA/Plavix  3. Pain Management: oxycodone  prn.             -pt will take oxycodone  prior to therapies based on his schedule. I won't schedule it  for now.  4. Mood/Behavior/Sleep:  LCSW to follow for evaluation and support.              -antipsychotic agents: N/A             --Melatonin prn for insomnia 5. Neuropsych/cognition: This patient is capable of making decisions on his own behalf. 6. Skin/Wound Care: surgical incisions CDI, healing  -air mattress for sacral wound, WOC. Santyl  to area of necrotic tissue  -discussed importance of nutrition with pt  -medicated shampoo for scalp 7. Fluids/Electrolytes/Nutrition: Strict I/O. Renal diet w/1200 cc/FR.  8.  Septic arthritis R-knee/R-shoulder: To continue Cefepime  and Vanc with HD thru 04/10/24             --shoulder to be aspirated in 8 weeks per Dr. [name]? 9.  Chronic hypotension: On midodrine  10  mg TID with prn doses  10. ESRD: HD MWF continue at the end of the day to help with tolerance of therapy.              --chronic hyponatremia managed with HD/FR 11. T2DM: A1C- 5.1. Will continue to monitor BS ac/hs and if controlled decrease to twice a day 12. Anemia of chronic disease: Jehovah's witness and refuses blood transfusion 13. Otitis media with TM perforation: Has completed antibiotic course.  --Follow up with [name] for evaluation.  14. Cervical myelopathy: Gabapentin  for neuropathic pain 15. PAD s/p R-SFA/popliteal lithotripsy/Stent: On ASA, Plavix  and Lipitor.       LOS: 1 days A FACE TO FACE EVALUATION WAS PERFORMED  Arthea ONEIDA Gunther 03/04/2024, 8:28 AM     "

## 2024-03-04 NOTE — Plan of Care (Signed)
" °  Problem: RH Balance Goal: LTG Patient will maintain dynamic standing with ADLs (OT) Description: LTG:  Patient will maintain dynamic standing balance with assist during activities of daily living (OT)  Flowsheets (Taken 03/04/2024 1242) LTG: Pt will maintain dynamic standing balance during ADLs with: Supervision/Verbal cueing   Problem: RH Grooming Goal: LTG Patient will perform grooming w/assist,cues/equip (OT) Description: LTG: Patient will perform grooming with assist, with/without cues using equipment (OT) Flowsheets (Taken 03/04/2024 1242) LTG: Pt will perform grooming with assistance level of: Independent with assistive device    Problem: RH Bathing Goal: LTG Patient will bathe all body parts with assist levels (OT) Description: LTG: Patient will bathe all body parts with assist levels (OT) Flowsheets (Taken 03/04/2024 1242) LTG: Pt will perform bathing with assistance level/cueing: Supervision/Verbal cueing   Problem: RH Dressing Goal: LTG Patient will perform upper body dressing (OT) Description: LTG Patient will perform upper body dressing with assist, with/without cues (OT). Flowsheets (Taken 03/04/2024 1242) LTG: Pt will perform upper body dressing with assistance level of: Independent with assistive device Goal: LTG Patient will perform lower body dressing w/assist (OT) Description: LTG: Patient will perform lower body dressing with assist, with/without cues in positioning using equipment (OT) Flowsheets (Taken 03/04/2024 1242) LTG: Pt will perform lower body dressing with assistance level of: Supervision/Verbal cueing   Problem: RH Toileting Goal: LTG Patient will perform toileting task (3/3 steps) with assistance level (OT) Description: LTG: Patient will perform toileting task (3/3 steps) with assistance level (OT)  Flowsheets (Taken 03/04/2024 1242) LTG: Pt will perform toileting task (3/3 steps) with assistance level: Supervision/Verbal cueing   Problem: RH Functional  Use of Upper Extremity Goal: LTG Patient will use RT/LT upper extremity as a (OT) Description: LTG: Patient will use right/left upper extremity as a stabilizer/gross assist/diminished/nondominant/dominant level with assist, with/without cues during functional activity (OT) Flowsheets (Taken 03/04/2024 1242) LTG: Use of upper extremity in functional activities:  RUE as dominant level  LUE as gross assist level LTG: Pt will use upper extremity in functional activity with assistance level of: Independent with assistive device   Problem: RH Toilet Transfers Goal: LTG Patient will perform toilet transfers w/assist (OT) Description: LTG: Patient will perform toilet transfers with assist, with/without cues using equipment (OT) Flowsheets (Taken 03/04/2024 1242) LTG: Pt will perform toilet transfers with assistance level of: Supervision/Verbal cueing   Problem: RH Tub/Shower Transfers Goal: LTG Patient will perform tub/shower transfers w/assist (OT) Description: LTG: Patient will perform tub/shower transfers with assist, with/without cues using equipment (OT) Flowsheets (Taken 03/04/2024 1242) LTG: Pt will perform tub/shower stall transfers with assistance level of: Supervision/Verbal cueing   "

## 2024-03-04 NOTE — Progress Notes (Signed)
 Initial Nutrition Assessment  DOCUMENTATION CODES:   Not applicable  INTERVENTION:   [employer] 1.4 PO BID, each supplement provides 455 kcal and 20 grams protein.   1 packet Juven BID, each packet provides 95 calories, 2.5 grams of protein (collagen), and 9.8 grams of carbohydrate (3 grams sugar); also contains 7 grams of L-arginine and L-glutamine, 300 mg vitamin C, 15 mg vitamin E, 1.2 mcg vitamin B-12, 9.5 mg zinc, 200 mg calcium , and 1.5 g  Calcium  Beta-hydroxy-Beta-methylbutyrate to support wound healing   30 ml ProSource Plus BID, each supplement provides 100 kcals and 15 grams protein.    Continue Renavit Daily   NUTRITION DIAGNOSIS:   Increased nutrient needs related to wound healing as evidenced by estimated needs.   GOAL:   Patient will meet greater than or equal to 90% of their needs   MONITOR:   PO intake, Supplement acceptance  REASON FOR ASSESSMENT:   Consult Diet education, Wound healing  ASSESSMENT:   PMH: renal cancer, ESRD, HD, PUD, HTN, C4 spinal fracture, R cholectomy, infection in right knee  Admitted with: debility, wounds on knee, sacrum, shoulder  Pt was not in room at the time of assessment, therefore unable to assess. During acute hospital admission pt was receiving ProSource and Mallie [place] which has been ordered and he continues to receive.Would recommend continuing supplementation to support would healing. Will also add Juven to further support wound healing.   During previous hospital stay pt wt was about 85.4 kgs and currently is 86.5 kg, showing no notable wt loss.   Average Meal Intake: 1/20: 75% intake x 1 recorded meals  Nutritionally Relevant Medications: Scheduled Meds:  (feeding supplement) PROSource Plus  30 mL Oral BID BM   atorvastatin   10 mg Oral Daily   [START ON 03/09/2024] darbepoetin (ARANESP ) injection - DIALYSIS  100 mcg Subcutaneous Q Mon-1800   docusate sodium   100 mg Oral BID   feeding supplement (KATE FARMS  STANDARD 1.4) Liquid  325 mL Oral Daily   ferrous sulfate   325 mg Oral Q breakfast   folic acid   1 mg Oral Daily   insulin  aspart  0-6 Units Subcutaneous TID WC   multivitamin  1 tablet Oral QHS   pantoprazole   40 mg Oral BID AC   psyllium  1 packet Oral Daily   selenium  sulfide  1 Application Topical Daily   sevelamer  carbonate  2,400 mg Oral TID WC    PRN Meds: bisacodyl , milk and molasses, sorbitol   Labs Reviewed: Na: 130 BUN:55 Creatinine: 7.84 GFR: 7  CBG ranges from 96-107 mg/dL over the last 24 hours HgbA1c 5.1    NUTRITION - FOCUSED PHYSICAL EXAM:  Unable to assess, pt was not in room. Refer to follow up.   Diet Order:   Diet Order             Diet renal with fluid restriction Fluid restriction: 1200 mL Fluid; Room service appropriate? Yes; Fluid consistency: Thin  Diet effective now                   EDUCATION NEEDS:   No education needs have been identified at this time  Skin:  Skin Assessment: Reviewed RN Assessment Skin Integrity Issues:: Unstageable Unstageable: Unable to access wounds in person  Last BM:  1/18  Height:   Ht Readings from Last 1 Encounters:  03/03/24 6' 2 (1.88 m)    Weight:   Wt Readings from Last 1 Encounters:  03/04/24 86.5 kg  BMI:  Body mass index is 24.48 kg/m.  Estimated Nutritional Needs:   Kcal:  2100-2400  Protein:  125-150 g  Fluid:  1.2 L    Con Friends, Dietetic Intern

## 2024-03-04 NOTE — Plan of Care (Signed)
" °  Problem: RH Balance Goal: LTG Patient will maintain dynamic sitting balance (PT) Description: LTG:  Patient will maintain dynamic sitting balance with assistance during mobility activities (PT) Flowsheets (Taken 03/04/2024 2059) LTG: Pt will maintain dynamic sitting balance during mobility activities with:: Supervision/Verbal cueing Goal: LTG Patient will maintain dynamic standing balance (PT) Description: LTG:  Patient will maintain dynamic standing balance with assistance during mobility activities (PT) Flowsheets (Taken 03/04/2024 2059) LTG: Pt will maintain dynamic standing balance during mobility activities with:: Supervision/Verbal cueing   Problem: Sit to Stand Goal: LTG:  Patient will perform sit to stand with assistance level (PT) Description: LTG:  Patient will perform sit to stand with assistance level (PT) Flowsheets (Taken 03/04/2024 2059) LTG: PT will perform sit to stand in preparation for functional mobility with assistance level: Supervision/Verbal cueing   Problem: RH Bed Mobility Goal: LTG Patient will perform bed mobility with assist (PT) Description: LTG: Patient will perform bed mobility with assistance, with/without cues (PT). Flowsheets (Taken 03/04/2024 2059) LTG: Pt will perform bed mobility with assistance level of: Supervision/Verbal cueing   Problem: RH Bed to Chair Transfers Goal: LTG Patient will perform bed/chair transfers w/assist (PT) Description: LTG: Patient will perform bed to chair transfers with assistance (PT). Flowsheets (Taken 03/04/2024 2059) LTG: Pt will perform Bed to Chair Transfers with assistance level: Supervision/Verbal cueing   Problem: RH Car Transfers Goal: LTG Patient will perform car transfers with assist (PT) Description: LTG: Patient will perform car transfers with assistance (PT). Flowsheets (Taken 03/04/2024 2059) LTG: Pt will perform car transfers with assist:: Contact Guard/Touching assist   Problem: RH Ambulation Goal: LTG  Patient will ambulate in controlled environment (PT) Description: LTG: Patient will ambulate in a controlled environment, # of feet with assistance (PT). Flowsheets (Taken 03/04/2024 2059) LTG: Pt will ambulate in controlled environ  assist needed:: Contact Guard/Touching assist LTG: Ambulation distance in controlled environment: 75 feet with LRAD Goal: LTG Patient will ambulate in home environment (PT) Description: LTG: Patient will ambulate in home environment, # of feet with assistance (PT). Flowsheets (Taken 03/04/2024 2059) LTG: Ambulation distance in home environment: 25 feet with LRAD   "

## 2024-03-04 NOTE — Progress Notes (Signed)
 Inpatient Rehabilitation  Patient information reviewed and entered into eRehab system by [facility]. Karen Kays., CCC/SLP, PPS Coordinator.  Information including medical coding, functional ability and quality indicators will be reviewed and updated through discharge.

## 2024-03-04 NOTE — Progress Notes (Signed)
 Occupational Therapy Session Note  Patient Details  Name: Carl Klopf Sr. MRN: [id number] Date of Birth: [birth year]  Today's Date: 03/04/2024 OT Individual Time: 1105-1200 OT Individual Time Calculation (min): 55 min    Short Term Goals: Week 1:  OT Short Term Goal 1 (Week 1): Pt will increase ROM of Rt shoulder by 10* OT Short Term Goal 2 (Week 1): pt will complete sit to stand at Mod A using LRAD OT Short Term Goal 3 (Week 1): Pt will stand with Mod A for 1 min< with LRAD in prep for ADLs  Skilled Therapeutic Interventions/Progress Updates:  Balance/vestibular training;DME/adaptive equipment instruction;Patient/family education;Therapeutic Activities;Wheelchair propulsion/positioning;Functional electrical stimulation;Psychosocial support;Therapeutic Exercise;Community reintegration;Functional mobility training;Self Care/advanced ADL retraining;UE/LE Strength taining/ROM;Discharge planning;Neuromuscular re-education;Skin care/wound managment;UE/LE Coordination activities;Splinting/orthotics;Disease mangement/prevention;Pain management   Therapy Documentation Precautions:  Precautions Precautions: Fall Precaution/Restrictions Comments: watch HR, R knee buckles, spinal Restrictions Weight Bearing Restrictions Per Provider Order: Yes RUE Weight Bearing Per Provider Order: Weight bearing as tolerated RLE Weight Bearing Per Provider Order: Weight bearing as tolerated Other Position/Activity Restrictions: Per Dr. [name] on 1/19 pt can perform ROM and WB through RUE as tolerated General: Pt supine in bed upon OT arrival, agreeable to OT session.  Pain:  5/10 pain reported in Rt knee and shoulder, activity, intermittent rest breaks, distractions provided for pain management, pt reports tolerable to proceed.   Exercises: OT providing skilled intervention with ROM, strength and mobility of knee and shoulder. OT providing passive range in all planes before activities. OT assisting with AAROM  of knee supine in bed for gravity elimination. OT passively ranging to about 90-95* of flexion. OT completing AAROM in anti-gravity position for BUE, able to reach ~90*.   Pt supine in bed with bed alarm activated, 2 bed rails up, call light within reach and 4Ps assessed.   Therapy/Group: Individual Therapy  Camie Hoe, OTD, OTR/L 03/04/2024, 12:12 PM

## 2024-03-04 NOTE — Progress Notes (Signed)
" °   03/04/24 1736  Vitals  Temp (!) 97.4 F (36.3 C)  Pulse Rate 88  Resp 14  BP 124/74  SpO2 99 %  O2 Device Room Air  Weight 85.7 kg  Type of Weight Post-Dialysis  Oxygen Therapy  Patient Activity (if Appropriate) In bed  Pulse Oximetry Type Continuous  Oximetry Probe Site Changed No  Post Treatment  Dialyzer Clearance Lightly streaked  Liters Processed 63  Fluid Removed (mL) 1500 mL  Tolerated HD Treatment Yes  Post-Hemodialysis Comments Pt. has no complaints  AVG/AVF Arterial Site Held (minutes) 7 minutes  AVG/AVF Venous Site Held (minutes) 7 minutes    "

## 2024-03-04 NOTE — Evaluation (Signed)
 Occupational Therapy Assessment and Plan  Patient Details  Name: Carl Antrim Sr. MRN: [id number] Date of Birth: 1960/03/05  OT Diagnosis: acute pain, muscle weakness (generalized), pain in joint, and swelling of limb Rehab Potential: Rehab Potential (ACUTE ONLY): Good ELOS: 16-21 days   Today's Date: 03/04/2024 OT Individual Time: 9199-9084 OT Individual Time Calculation (min): 75 min     Hospital Problem: Principal Problem:   Debility   Past Medical History:  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    Parathyroid  abnormality    ectopic parathyroid  gland   Presence of arteriovenous fistula for hemodialysis, primary    RUE PER PT RLE   Refusal of blood product    NO WHOLE BLOOD PROUCTS   Renal cell carcinoma (HCC)    s/p hand assisted laparoscopic bilateral nephrectomies 11/29/17, + RCC left   Secondary hyperparathyroidism    Seizures (HCC)    one episode in past, due to elevated Potassium 08/02/20- at least 4 years ago   Sleep apnea    doesn't use CPAP anymore since weight loss   Stroke [facility])    no residual   Past Surgical History:  Past Surgical History:  Procedure Laterality Date   ABDOMINAL AORTOGRAM W/LOWER EXTREMITY N/A 12/09/2023   Procedure: ABDOMINAL AORTOGRAM W/LOWER EXTREMITY;  Surgeon: Sheree Penne Bruckner, MD;  Location: [name] INVASIVE CV LAB;  Service: Cardiovascular;  Laterality: N/A;   ANTERIOR CERVICAL CORPECTOMY N/A  05/25/2023   Procedure: ANTERIOR CERVICAL CORPECTOMY CERVICAL FOUR, CERVICAL THREE - CERVICAL FIVE FUSION;  Surgeon: [name] Shove, MD;  Location: [facility] OR;  Service: Neurosurgery;  Laterality: N/A;   AV FISTULA PLACEMENT Right    right arm   BIOPSY  08/01/2019   Procedure: BIOPSY;  Surgeon: Kristie Lamprey, MD;  Location: [facility] ENDOSCOPY;  Service: Endoscopy;;   BIOPSY  11/18/2020   Procedure: BIOPSY;  Surgeon: Stacia Glendia BRAVO, MD;  [facility];  Service: Gastroenterology;;   BIOPSY  09/13/2021   Procedure: BIOPSY;  Surgeon: Wilhelmenia Aloha Raddle., MD;  Location: [facility] ENDOSCOPY;  Service: Gastroenterology;;   CATARACT EXTRACTION W/ INTRAOCULAR LENS  IMPLANT, BILATERAL     COLON SURGERY     COLONOSCOPY N/A 08/04/2015   Procedure: COLONOSCOPY;  Surgeon: Gordy CHRISTELLA Starch, MD;  Location: [facility] ENDOSCOPY;  Service: Endoscopy;  Laterality: N/A;   COLONOSCOPY  2017   JMP@ [facility]-good prep-mass -recall 1 yr   COLONOSCOPY N/A 09/13/2021   Procedure: COLONOSCOPY;  Surgeon: Mansouraty, Aloha Raddle., MD;  Location: [facility] ENDOSCOPY;  Service: Gastroenterology;  Laterality: N/A;   COLONOSCOPY WITH PROPOFOL  N/A 11/25/2020   Procedure: COLONOSCOPY WITH PROPOFOL ;  Surgeon: Federico Rosario BROCKS, MD;  Location: [facility] ENDOSCOPY;  Service: Gastroenterology;  Laterality: N/A;   COLONOSCOPY WITH PROPOFOL  N/A 09/23/2021   Procedure: COLONOSCOPY WITH PROPOFOL ;  Surgeon: San,  Sandor GAILS, DO;  Location: [facility] ENDOSCOPY;  Service: Gastroenterology;  Laterality: N/A;   ENTEROSCOPY N/A 09/23/2021   Procedure: ENTEROSCOPY;  Surgeon: San Sandor GAILS, DO;  Location: [facility] ENDOSCOPY;  Service: Gastroenterology;  Laterality: N/A;  Will place order for video capsule study as we may opt to place it during procedure   ESOPHAGOGASTRODUODENOSCOPY N/A 08/01/2019   Procedure: ESOPHAGOGASTRODUODENOSCOPY (EGD);  Surgeon: Kristie Lamprey, MD;  Location: [facility] ENDOSCOPY;  Service: Endoscopy;  Laterality: N/A;   ESOPHAGOGASTRODUODENOSCOPY N/A 11/18/2020    Procedure: ESOPHAGOGASTRODUODENOSCOPY (EGD);  Surgeon: Stacia Glendia BRAVO, MD;  Location: [facility] ENDOSCOPY;  Service: Gastroenterology;  Laterality: N/A;   ESOPHAGOGASTRODUODENOSCOPY (EGD) WITH PROPOFOL  N/A 08/04/2019   Procedure: ESOPHAGOGASTRODUODENOSCOPY (EGD) WITH PROPOFOL ;  Surgeon: Leigh Elspeth SQUIBB, MD;  Location: [facility] ENDOSCOPY;  Service: Gastroenterology;  Laterality: N/A;   ESOPHAGOGASTRODUODENOSCOPY (EGD) WITH PROPOFOL  N/A 09/13/2021   Procedure: ESOPHAGOGASTRODUODENOSCOPY (EGD) WITH PROPOFOL ;  Surgeon: Wilhelmenia Aloha Raddle., MD;  Location: [facility] ENDOSCOPY;  Service: Gastroenterology;  Laterality: N/A;   GIVENS CAPSULE STUDY N/A 09/23/2021   Procedure: GIVENS CAPSULE STUDY;  Surgeon: San Sandor GAILS, DO;  Location: [facility] ENDOSCOPY;  Service: Gastroenterology;  Laterality: N/A;   graft left arm Left    for dialysis x 2. Removed   HARDWARE REMOVAL Right 02/28/2024   Procedure: RIGHT SHOULDER ARTHROSCOPIC WASHOUT AND PLACEMENT OF ANTIBIOTIC BEADS;  Surgeon: [name] Cordella Glendia, MD;  Location: [facility] OR;  Service: Orthopedics;  Laterality: Right;  RIGHT SHOULDER HARDWARE REMOVAL, PLACEMENT OF ANTIBIOTIC SPACER   HOT HEMOSTASIS  11/18/2020   Procedure: HOT HEMOSTASIS (ARGON PLASMA COAGULATION/BICAP);  Surgeon: Stacia Glendia BRAVO, MD;  Location: [facility] ENDOSCOPY;  Service: Gastroenterology;;   HOT HEMOSTASIS N/A 09/23/2021   Procedure: HOT HEMOSTASIS (ARGON PLASMA COAGULATION/BICAP);  Surgeon: San Sandor GAILS, DO;  Location: [facility] ENDOSCOPY;  Service: Gastroenterology;  Laterality: N/A;   INSERTION OF DIALYSIS CATHETER     Rt chest   IR US  GUIDE BX ASP/DRAIN  02/25/2024   KNEE ARTHROSCOPY Right 02/21/2024   Procedure: ARTHROSCOPY, KNEE;  Surgeon: [name] Kay HERO, MD;  Location: [facility] OR;  Service: Orthopedics;  Laterality: Right;   LAPAROSCOPIC RIGHT COLECTOMY N/A 08/05/2015   Procedure: LAPAROSCOPIC RIGHT COLECTOMY- ASCENDING;  Surgeon: Jina Nephew, MD;  Location: [facility] OR;  Service: General;  Laterality:  N/A;   LOWER EXTREMITY ANGIOGRAPHY N/A 12/09/2023   Procedure: Lower Extremity Angiography;  Surgeon: Sheree Penne Bruckner, MD;  Location: [facility] INVASIVE CV LAB;  Service: Cardiovascular;  Laterality: N/A;   LOWER EXTREMITY INTERVENTION N/A 12/09/2023   Procedure: LOWER EXTREMITY INTERVENTION;  Surgeon: Sheree Penne Bruckner, MD;  Location: [facility] INVASIVE CV LAB;  Service: Cardiovascular;  Laterality: N/A;   MASS EXCISION Left 05/28/2019   Procedure: EXCISION SOFT TISSUE MASS LEFT SHOULDER;  Surgeon: Eletha Boas, MD;  Location: [facility] ORS;  Service: General;  Laterality: Left;   NEPHRECTOMY Bilateral    PARATHYROIDECTOMY N/A 06/12/2016   Procedure: TOTAL PARATHYROIDECTOMY WITH AUTOTRANSPLANTATION TO LEFT FOREARM;  Surgeon: Boas Eletha, MD;  Location: [facility] OR;  Service: General;  Laterality: N/A;   PARATHYROIDECTOMY N/A 10/23/2016   Procedure: PARATHYROIDECTOMY;  Surgeon: Eletha Boas, MD;  Location: [facility] OR;  Service: General;  Laterality: N/A;   REVERSE SHOULDER ARTHROPLASTY Right 08/24/2020   Procedure: REVERSE SHOULDER ARTHROPLASTY;  Surgeon: Cristy Bonner DASEN, MD;  Location: [facility] OR;  Service: Orthopedics;  Laterality: Right;   REVISON OF ARTERIOVENOUS FISTULA Right 07/16/2017   Procedure: REVISION OF ARTERIOVENOUS FISTULA  Right ARM;  Surgeon: Sheree Penne Bruckner, MD;  Location: [facility] OR;  Service:  Vascular;  Laterality: Right;   TOTAL HIP ARTHROPLASTY Left 11/14/2020   Procedure: LEFT TOTAL HIP ARTHROPLASTY ANTERIOR APPROACH;  Surgeon: [name] Kay HERO, MD;  Location: [facility] OR;  Service: Orthopedics;  Laterality: Left;   UPPER GASTROINTESTINAL ENDOSCOPY  2021   @ [facility]    Assessment & Plan Clinical Impression: Carl Qin Sr is a 64 year old male with history of renal CA s/p nephrectomies--HD MWF, PUD, chronic HA, HTN, diverticulosis s/p R-colectomy, PAD with RLE ischemia s/p stent, bilateral knee OA, right shoulder pain (need of revision),  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 He also reported increase in right sided weakness and NS consulted. MRI C spine done showing mild stenosis at C3-C4 and not cord compression--no neurosurgical intervention needed per Dr. Colon. MRI brain negative for stroke but showed large bilateral mastoid effusions and Dr. Merrianne recommended ENT consult due to concerns of mastoiditis.[name] consulted who felt that CT without evidence of coalescent mastoiditis and exam consistent with left suppurative otitis media and right EAC occluded with cerumen. He  recommended was treated with Zosyn  and ciprodex  drops X 14 days. [name]    He continued to have limitation due to weakness and right shoulder pain. MRI right shoulder showed fluid collection 5.1 X 2.9 X 1.9 in posterior subacromial/subdeltoid space could be seroma and severe fatty atrophy of subcapularis, supraspinatus and infraspinatus muscles. MRI lumbar spine revealed L3-L4 moderate right and mild left foraminal stenosis, moderate right L4-L5 stenosis, right lateral L3-L4 disc edema with mild surrounding marrow edema and severe degenerative changes.  MRI thoracic spine revealed severe left foraminal stenosis T11-T12, moderate right foraminal stenosis T2-T3, T8-T9 and T9-T10 and T11 and T12 endplate edema question degenerative or acute/subacute Fx. Dr. Onetha reviewed films and felt X rays with degenerative changes and no acute Fx. Right sided weakness felt to be recrudescence of SCI symptoms in setting of infection.    Blood cultures negative. Right knee showed tricompartmental OA. Ortho re-consulted due to on onging knee pain and knee aspirated showing 71,000 WBC with 94 % neutrophils and no organisms. 2 D echo done revealing EF 65-70%, moderate ot severe mitral calcification and linear calcified mass on AV with moderate thickening and  mild to moderate AVR. Cardiology felt that patient with degenerative changes on valves and intermediate cardiac risk. He underwent I and D right knee by Dr. [name] on 01/09 and scant purulent synovial fluid encountered. Dr. Fleeta Rothman consulted and recommended Cefepime  and Vanc with HD.  He spiked fever on 01/12 and Dr. Fleeta Rothman recommended evaluation of right shoulder fluid collection which was aspirated X 2 showing  64,000 WBC and  93,5000 WBC but  no organisms seen.  He underwent I and D right shoulder with placement of antibiotic beads (instead of liner replacement due to anemia/Jehovah's witness) by Dr. [name] on 01/16 with recommendations to aspirate shoulder in 8 weeks. .  Patient transferred to CIR on 03/03/2024 .    Patient currently requires max-total with basic self-care skills secondary to muscle weakness, decreased cardiorespiratoy endurance, and decreased standing balance, decreased postural control, and decreased balance strategies.  Prior to hospitalization, patient could complete ADLs with modified independent .  Patient will benefit from skilled intervention to decrease level of assist with basic self-care skills and increase independence with basic self-care skills prior to discharge home  with care partner.  Anticipate patient will require intermittent supervision and follow up home health and follow up outpatient.  OT - End of Session Activity Tolerance: Tolerates 30+ min activity with multiple rests Endurance Deficit: Yes OT Assessment Rehab Potential (ACUTE ONLY): Good OT Barriers to Discharge: None OT Patient demonstrates impairments in the following area(s): Balance;Edema;Endurance;Motor;Pain;Safety;Skin Integrity;Sensory OT Basic ADL's Functional Problem(s): Grooming;Bathing;Dressing;Toileting OT Advanced ADL's Functional Problem(s): None OT Transfers Functional Problem(s): Toilet;Tub/Shower OT Additional Impairment(s): Fuctional Use of Upper Extremity OT Plan OT Intensity: Minimum of  1-2 x/day, 45 to 90 minutes OT Frequency: 5 out of 7 days OT Duration/Estimated Length of Stay: 16-21 days OT Treatment/Interventions: Balance/vestibular training;DME/adaptive equipment instruction;Patient/family education;Therapeutic Activities;Wheelchair propulsion/positioning;Functional electrical stimulation;Psychosocial support;Therapeutic Exercise;Community reintegration;Functional mobility training;Self Care/advanced ADL retraining;UE/LE Strength taining/ROM;Discharge planning;Neuromuscular re-education;Skin care/wound managment;UE/LE Coordination activities;Splinting/orthotics;Disease mangement/prevention;Pain management OT Self Feeding Anticipated Outcome(s): mod I OT Basic Self-Care Anticipated Outcome(s): SBA OT Toileting Anticipated Outcome(s): SBA OT Bathroom Transfers Anticipated Outcome(s): SBA OT Recommendation Recommendations for Other Services: Therapeutic Recreation consult Therapeutic Recreation Interventions: Pet therapy Patient destination: Home Follow Up Recommendations: Outpatient OT Equipment Recommended: To be determined   OT Evaluation Precautions/Restrictions  Precautions Precautions: Fall Precaution/Restrictions Comments: watch HR, R knee buckles, spinal Restrictions Weight Bearing Restrictions Per Provider Order: Yes RUE Weight Bearing Per Provider Order: Weight bearing as tolerated RLE Weight Bearing Per Provider Order: Weight bearing as tolerated Other Position/Activity Restrictions: Per Dr. [name] on 1/19 pt can perform ROM and WB through RUE as tolerated General Chart Reviewed: Yes Response to Previous Treatment: Not applicable Family/Caregiver Present: No Pain Pain Assessment Pain Scale: 0-10 Pain Score: 7  Pain Type: Surgical pain Pain Location: Arm Pain Orientation: Right Pain Descriptors / Indicators: Aching Pain Onset: On-going Pain Intervention(s): Medication (See eMAR) Multiple Pain Sites: Yes 2nd Pain Site Pain Score: 7 Pain Type:  Surgical pain Pain Location: Knee Pain Orientation: Right Home Living/Prior Functioning Home Living Family/patient expects to be discharged to:: Private residence Living Arrangements: Children Available Help at Discharge: Family, Available 24 hours/day Type of Home: Apartment Home Access: Level entry Home Layout: One level Bathroom Shower/Tub: Tub/shower unit (pt endorses he has a TTB bench but it won't fit) Bathroom Toilet: Standard (toilet riser) Bathroom Accessibility: Yes  Lives With: Son IADL History Homemaking Responsibilities: Yes Meal Prep Responsibility: Secondary Laundry Responsibility: Secondary Cleaning Responsibility: Primary Bill Paying/Finance Responsibility: No Shopping Responsibility: Secondary Child Care Responsibility: No Current License: Yes Mode of Transportation: Other (comment) (truck) Occupation: Retired Prior Function Level of Independence: Independent with basic ADLs, Independent with homemaking with ambulation, Independent with transfers, Independent with gait  Able to Take Stairs?: No Driving: Yes Leisure: Hobbies-yes (Comment) Vision Baseline Vision/History: 1 Wears glasses Ability to See in Adequate Light: 0 Adequate Patient Visual Report: No change from baseline Vision Assessment?: No apparent visual deficits Perception  Perception: Within Functional Limits Praxis Praxis: WFL Cognition Cognition Overall Cognitive Status: Within Functional Limits for tasks assessed Arousal/Alertness: Awake/alert Orientation Level: Person;Place;Situation Person: Oriented Place: Oriented Situation: Oriented Memory: Appears intact Sustained Attention: Appears intact Awareness: Appears intact Problem Solving: Appears intact Safety/Judgment: Appears intact Brief Interview for Mental Status (BIMS) Repetition of Three Words (First Attempt): 3 Temporal Orientation: Year: Correct Temporal Orientation: Month: Accurate within 5 days Temporal Orientation:  Day: Correct Recall: Sock: Yes, no cue required Recall: Blue: Yes, no cue required Recall: Bed: Yes, no cue required BIMS Summary Score: 15 Sensation Sensation Light Touch: Impaired by gross assessment Additional Comments: intermittent numbness/tingling from knees down Coordination Gross Motor Movements are Fluid and  Coordinated: No Fine Motor Movements are Fluid and Coordinated: No Coordination and Movement Description: 2/2 debility Motor  Motor Motor: Other (comment);Abnormal postural alignment and control Motor - Skilled Clinical Observations: prior SCI, also 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  Trunk/Postural Assessment  Cervical Assessment Cervical Assessment: Exceptions to [facility] (forward head) Thoracic Assessment Thoracic Assessment: Exceptions to [facility] (rounded shoulders) Lumbar Assessment Lumbar Assessment: Exceptions to [facility] (posterior pelvic tilt) Postural Control Postural Control: Deficits on evaluation Righting Reactions: delayed Protective Responses: inadequate  Balance Balance Balance Assessed: Yes Static Sitting Balance Static Sitting - Balance Support: Feet supported;No upper extremity supported Static Sitting - Level of Assistance: 6: Modified independent (Device/Increase time) Dynamic Sitting Balance Dynamic Sitting - Balance Support: During functional activity Dynamic Sitting - Level of Assistance: 6: Modified independent (Device/Increase time) Static Standing Balance Static Standing - Balance Support: Bilateral upper extremity supported Static Standing - Level of Assistance: 3: Mod assist Dynamic Standing Balance Dynamic Standing - Balance Support: During functional activity;Bilateral upper extremity supported Dynamic Standing - Level of Assistance: 2: Max assist Extremity/Trunk Assessment RUE Assessment RUE Assessment: Exceptions to [facility] Active Range of Motion (AROM) Comments: deltoid- 30*, WFL distal General Strength Comments: 2-/5 proximal, 4/5 distal LUE  Assessment LUE Assessment: Exceptions to [facility] Active Range of Motion (AROM) Comments: deltoid- 30*, WFL distal General Strength Comments: 2-/5 proximal, 4/5 distal  Care Tool Care Tool Self Care Eating   Eating Assist Level: Set up assist    Oral Care    Oral Care Assist Level: Set up assist    Bathing         Assist Level: Minimal Assistance - Patient > 75%    Upper Body Dressing(including orthotics)       Assist Level: Minimal Assistance - Patient > 75%    Lower Body Dressing (excluding footwear)     Assist for lower body dressing: Total Assistance - Patient < 25%    Putting on/Taking off footwear     Assist for footwear: Dependent - Patient 0%       Care Tool Toileting Toileting activity   Assist for toileting: Total Assistance - Patient < 25%     Care Tool Bed Mobility Roll left and right activity   Roll left and right assist level: Moderate Assistance - Patient 50 - 74%    Sit to lying activity   Sit to lying assist level: Moderate Assistance - Patient 50 - 74%    Lying to sitting on side of bed activity    Mod A     Care Tool Transfers Sit to stand transfer   Sit to stand assist level: Maximal Assistance - Patient 25 - 49%    Chair/bed transfer   Chair/bed transfer assist level: Dependent - Patient 0% (stedy)     Toilet transfer   Assist Level: Dependent - Patient 0% (stedy)     Care Tool Cognition  Expression of Ideas and Wants Expression of Ideas and Wants: 4. Without difficulty (complex and basic) - expresses complex messages without difficulty and with speech that is clear and easy to understand  Understanding Verbal and Non-Verbal Content Understanding Verbal and Non-Verbal Content: 4. Understands (complex and basic) - clear comprehension without cues or repetitions   Memory/Recall Ability Memory/Recall Ability : Current season;Staff names and faces;Location of own room;That he or she is in a hospital/hospital unit   Refer to Care Plan for  Long Term Goals  SHORT TERM GOAL WEEK 1 OT Short Term Goal 1 (Week 1): Pt will increase ROM  of Rt shoulder by 10* OT Short Term Goal 2 (Week 1): pt will complete sit to stand at Mod A using LRAD OT Short Term Goal 3 (Week 1): Pt will stand with Mod A for 1 min< with LRAD in prep for ADLs  Recommendations for other services: Therapeutic Recreation  Pet therapy   Skilled Therapeutic Intervention ADL ADL Equipment Provided: Long-handled sponge Eating: Set up Grooming: Setup Where Assessed-Grooming: Sitting at sink Upper Body Bathing: Supervision/safety Where Assessed-Upper Body Bathing: Shower Lower Body Bathing: Minimal assistance Where Assessed-Lower Body Bathing: Shower Upper Body Dressing: Minimal assistance Where Assessed-Upper Body Dressing: Edge of bed Lower Body Dressing: Dependent;Maximal assistance Where Assessed-Lower Body Dressing: Edge of bed Toileting: Maximal assistance;Dependent Toilet Transfer: Other (comment) (stedy transfer, Max A to stand in stedy) Tub/Shower Transfer: Unable to assess Film/video Editor: Other (comment) (stedy transfer, Max A to stand within stedy) Mobility  Bed Mobility Bed Mobility: Supine to Sit;Sitting - Scoot to Edge of Bed Supine to Sit: Contact Guard/Touching assist Sitting - Scoot to Edge of Bed: Contact Guard/Touching assist Transfers Sit to Stand: Moderate Assistance - Patient 50-74%;Maximal Assistance - Patient 25-49% Stand to Sit: Moderate Assistance - Patient 50-74%;Maximal Assistance - Patient 25-49%  1:1 evaluation and treatment session initiated this date. OT roles, goals and purpose discussed with pt as well as therapy schedule. ADL completed this date with levels of assist listed above. Pt presenting with edema of Rt knee and Rt shoulder as well as pain. Pt completing full ADL this session with Max-total LB ADLs and Min UB ADLs Pt would benefit from skilled OT in IPR setting in order to maximize independence with ADLs upon  D/C.   Discharge Criteria: Patient will be discharged from OT if patient refuses treatment 3 consecutive times without medical reason, if treatment goals not met, if there is a change in medical status, if patient makes no progress towards goals or if patient is discharged from hospital.  The above assessment, treatment plan, treatment alternatives and goals were discussed and mutually agreed upon: by patient  Camie Hoe, OTD, OTR/L 03/04/2024, 10:54 AM

## 2024-03-04 NOTE — Progress Notes (Signed)
 Pt. Came in awake and oriented with no complaints Consent verified and on file. Started with no complaints  UF removal:1517ml Tx duration: 3.5 hours  Access used: Right AVF Access issue: None  Tx completed and tolerated. Pressure dressing applied Endorsed to floor nurse Transported to room.  Janat Tabbert Rubi Gerell Fortson, RN Kidney Care Unit

## 2024-03-04 NOTE — Consult Note (Signed)
 [facility] Renal Consultation Note    Indication for Consultation:  Management of ESRD/hemodialysis; anemia, hypertension/volume and secondary hyperparathyroidism  ERE:Xnpmjoj, Dibas, MD  HPI: Carl Wilmeth Sr. is a 64 y.o. male with ESRD on HD MWF at [facility]. He has a past medical history significant for renal CA s/p nephrectomies, PUD, chronic HA, HTN, diverticulosis s/p R-colectomy, PAD with RLE ischemia s/p stent, bilateral knee OA, right shoulder pain (need of revision), SCI s/p C3-C5 ACDF 05/2023 (inpatient rehab stay).   Patient admitted for septic arthritis R knee and R shoulder. S/p I&D 1/0 per ortho. S/p fluid collection 1/13 and highly infectious. ID and Ortho following. Per ID, patient to continue IV ABXs (Vanc and Cefepime ) until 04/10/24. Patient transferred to CIR. We were consulted for ongoing dialysis needs.  Seen and examined patient at bedside. Currently working with PT. He denies SOB, CP, and N/V. Plan for HD this afternoon.  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    Parathyroid  abnormality    ectopic parathyroid  gland   Presence of arteriovenous fistula for hemodialysis, primary    RUE PER PT RLE   Refusal of blood product    NO WHOLE BLOOD PROUCTS   Renal cell carcinoma (HCC)    s/p hand assisted laparoscopic bilateral nephrectomies 11/29/17, + RCC left   Secondary hyperparathyroidism    Seizures (HCC)     one episode in past, due to elevated Potassium 08/02/20- at least 4 years ago   Sleep apnea    doesn't use CPAP anymore since weight loss   Stroke [facility])    no residual   Past Surgical History:  Procedure Laterality Date   ABDOMINAL AORTOGRAM W/LOWER EXTREMITY N/A 12/09/2023   Procedure: ABDOMINAL AORTOGRAM W/LOWER EXTREMITY;  Surgeon: Sheree Penne Bruckner, MD;  Location: [facility] INVASIVE CV LAB;  Service: Cardiovascular;  Laterality: N/A;   ANTERIOR CERVICAL CORPECTOMY N/A 05/25/2023   Procedure: ANTERIOR CERVICAL CORPECTOMY CERVICAL FOUR, CERVICAL THREE - CERVICAL FIVE FUSION;  Surgeon: [name] Shove, MD;  Location: [facility] OR;  Service: Neurosurgery;  Laterality: N/A;   AV FISTULA PLACEMENT Right    right arm   BIOPSY  08/01/2019   Procedure: BIOPSY;  Surgeon: Kristie Lamprey, MD;  Location: [facility] ENDOSCOPY;  Service: Endoscopy;;   BIOPSY  11/18/2020   Procedure: BIOPSY;  Surgeon: Stacia Glendia BRAVO, MD;  Location: [facility] ENDOSCOPY;  Service: Gastroenterology;;   BIOPSY  09/13/2021   Procedure: BIOPSY;  Surgeon: Wilhelmenia Aloha Raddle., MD;  Location: [facility] ENDOSCOPY;  Service: Gastroenterology;;   CATARACT EXTRACTION W/ INTRAOCULAR LENS  IMPLANT, BILATERAL     COLON SURGERY     COLONOSCOPY N/A 08/04/2015   Procedure: COLONOSCOPY;  Surgeon: Gordy CHRISTELLA Starch, MD;  Location: [facility] ENDOSCOPY;  Service: Endoscopy;  Laterality: N/A;   COLONOSCOPY  2017   JMP@ [facility]-good prep-mass -recall 1 yr   COLONOSCOPY N/A 09/13/2021   Procedure: COLONOSCOPY;  Surgeon: Mansouraty, Aloha Raddle., MD;  Location: [facility] ENDOSCOPY;  Service: Gastroenterology;  Laterality: N/A;   COLONOSCOPY WITH PROPOFOL  N/A 11/25/2020   Procedure: COLONOSCOPY WITH PROPOFOL ;  Surgeon: Federico Rosario BROCKS, MD;  Location: [facility] ENDOSCOPY;  Service: Gastroenterology;  Laterality: N/A;   COLONOSCOPY WITH PROPOFOL  N/A 09/23/2021   Procedure: COLONOSCOPY WITH PROPOFOL ;  Surgeon: San Sandor GAILS, DO;  Location: [facility] ENDOSCOPY;  Service: Gastroenterology;   Laterality: N/A;   ENTEROSCOPY N/A 09/23/2021   Procedure: ENTEROSCOPY;  Surgeon: San Sandor GAILS, DO;  Location: [facility] ENDOSCOPY;  Service: Gastroenterology;  Laterality: N/A;  Will place order for video capsule study as we may opt to place it during procedure   ESOPHAGOGASTRODUODENOSCOPY N/A 08/01/2019   Procedure: ESOPHAGOGASTRODUODENOSCOPY (EGD);  Surgeon: Kristie Lamprey, MD;  Location: [facility] ENDOSCOPY;  Service: Endoscopy;  Laterality: N/A;   ESOPHAGOGASTRODUODENOSCOPY N/A 11/18/2020   Procedure: ESOPHAGOGASTRODUODENOSCOPY (EGD);  Surgeon: Stacia Glendia BRAVO, MD;  Location: [facility] ENDOSCOPY;  Service: Gastroenterology;  Laterality: N/A;   ESOPHAGOGASTRODUODENOSCOPY (EGD) WITH PROPOFOL  N/A 08/04/2019   Procedure: ESOPHAGOGASTRODUODENOSCOPY (EGD) WITH PROPOFOL ;  Surgeon: Leigh Elspeth SQUIBB, MD;  Location: [facility] ENDOSCOPY;  Service: Gastroenterology;  Laterality: N/A;   ESOPHAGOGASTRODUODENOSCOPY (EGD) WITH PROPOFOL  N/A 09/13/2021   Procedure: ESOPHAGOGASTRODUODENOSCOPY (EGD) WITH PROPOFOL ;  Surgeon: Wilhelmenia Aloha Raddle., MD;  Location: [facility] ENDOSCOPY;  Service: Gastroenterology;  Laterality: N/A;   GIVENS CAPSULE STUDY N/A 09/23/2021   Procedure: GIVENS CAPSULE STUDY;  Surgeon: San Sandor GAILS, DO;  Location: [facility] ENDOSCOPY;  Service: Gastroenterology;  Laterality: N/A;   graft left arm Left    for dialysis x 2. Removed   HARDWARE REMOVAL Right 02/28/2024   Procedure: RIGHT SHOULDER ARTHROSCOPIC WASHOUT AND PLACEMENT OF ANTIBIOTIC BEADS;  Surgeon: [name] Cordella Glendia, MD;  Location: [facility] OR;  Service: Orthopedics;  Laterality: Right;  RIGHT SHOULDER HARDWARE REMOVAL, PLACEMENT OF ANTIBIOTIC SPACER   HOT HEMOSTASIS  11/18/2020   Procedure: HOT HEMOSTASIS (ARGON PLASMA COAGULATION/BICAP);  Surgeon: Stacia Glendia BRAVO, MD;  Location: [facility] ENDOSCOPY;  Service: Gastroenterology;;   HOT HEMOSTASIS N/A 09/23/2021   Procedure: HOT HEMOSTASIS (ARGON PLASMA COAGULATION/BICAP);  Surgeon: San Sandor GAILS, DO;   Location: [facility] ENDOSCOPY;  Service: Gastroenterology;  Laterality: N/A;   INSERTION OF DIALYSIS CATHETER     Rt chest   IR US  GUIDE BX ASP/DRAIN  02/25/2024   KNEE ARTHROSCOPY Right 02/21/2024   Procedure: ARTHROSCOPY, KNEE;  Surgeon: [name] Kay HERO, MD;  Location: [facility] OR;  Service: Orthopedics;  Laterality: Right;   LAPAROSCOPIC RIGHT COLECTOMY N/A 08/05/2015   Procedure: LAPAROSCOPIC RIGHT COLECTOMY- ASCENDING;  Surgeon: Jina Nephew, MD;  Location: [facility] OR;  Service: General;  Laterality: N/A;   LOWER EXTREMITY ANGIOGRAPHY N/A 12/09/2023   Procedure: Lower Extremity Angiography;  Surgeon: Sheree Penne Bruckner, MD;  Location: [facility] INVASIVE CV LAB;  Service: Cardiovascular;  Laterality: N/A;   LOWER EXTREMITY INTERVENTION N/A 12/09/2023   Procedure: LOWER EXTREMITY INTERVENTION;  Surgeon: Sheree Penne Bruckner, MD;  Location: [facility] INVASIVE CV LAB;  Service: Cardiovascular;  Laterality: N/A;   MASS EXCISION Left 05/28/2019   Procedure: EXCISION SOFT TISSUE MASS LEFT SHOULDER;  Surgeon: Eletha Boas, MD;  Location: [facility] ORS;  Service: General;  Laterality: Left;   NEPHRECTOMY Bilateral    PARATHYROIDECTOMY N/A 06/12/2016   Procedure: TOTAL PARATHYROIDECTOMY WITH AUTOTRANSPLANTATION TO LEFT FOREARM;  Surgeon: Boas Eletha, MD;  Location: [facility] OR;  Service: General;  Laterality: N/A;   PARATHYROIDECTOMY N/A  10/23/2016   Procedure: PARATHYROIDECTOMY;  Surgeon: Eletha Boas, MD;  Location: [facility] OR;  Service: General;  Laterality: N/A;   REVERSE SHOULDER ARTHROPLASTY Right 08/24/2020   Procedure: REVERSE SHOULDER ARTHROPLASTY;  Surgeon: Cristy Bonner DASEN, MD;  Location: [facility] OR;  Service: Orthopedics;  Laterality: Right;   REVISON OF ARTERIOVENOUS FISTULA Right 07/16/2017   Procedure: REVISION OF ARTERIOVENOUS FISTULA  Right ARM;  Surgeon: Sheree Penne Bruckner, MD;  Location: [facility] OR;  Service: Vascular;  Laterality: Right;   TOTAL HIP ARTHROPLASTY Left 11/14/2020   Procedure: LEFT TOTAL HIP ARTHROPLASTY  ANTERIOR APPROACH;  Surgeon: [name] Kay HERO, MD;  Location: [facility] OR;  Service: Orthopedics;  Laterality: Left;   UPPER GASTROINTESTINAL ENDOSCOPY  2021   @ [facility]   Family History  Problem Relation Age of Onset   Diabetes Father    Stroke Father    Hypertension Father    Uterine cancer Mother    Lupus Sister    Stroke Sister    Hypertension Sister    Anuerysm Brother        brain   Colon cancer Neg Hx    Esophageal cancer Neg Hx    Stomach cancer Neg Hx    Pancreatic cancer Neg Hx    Liver disease Neg Hx    Colon polyps Neg Hx    Rectal cancer Neg Hx    Social History:  reports that he quit smoking about 33 years ago. His smoking use included cigarettes. He has never used smokeless tobacco. He reports that he does not currently use alcohol after a past usage of about 1.0 standard drink of alcohol per week. He reports that he does not currently use drugs after having used the following drugs: Marijuana. Allergies[1] Prior to Admission medications  Medication Sig Start Date End Date Taking? Authorizing Provider  acetaminophen  (TYLENOL ) 325 MG tablet Take 2 tablets (650 mg total) by mouth every 6 (six) hours as needed for mild pain (pain score 1-3) or fever (or Fever >/= 101). 03/03/24   Drusilla Sabas RAMAN, MD  acetaminophen  (TYLENOL ) 650 MG CR tablet Take 1,300 mg by mouth 2 (two) times daily.    [provider]  amoxicillin  (AMOXIL ) 500 MG capsule TAKE FOUR CAPSULES BY MOUTH ONE HOUR BEFORE DENTAL WORK/PROCEDURE 01/06/24   [name] Kay HERO, MD  aspirin  EC 81 MG tablet Take 1 tablet (81 mg total) by mouth daily. Swallow whole. 03/04/24   Drusilla Sabas RAMAN, MD  atorvastatin  (LIPITOR) 10 MG tablet Take 1 tablet (10 mg total) by mouth every Thursday. Patient not taking: Reported on 02/11/2024 05/30/23   Lue Elsie BROCKS, MD  ceFEPime  (MAXIPIME ) IVPB Inject 2 g into the vein every Monday, Wednesday, and Friday with hemodialysis. Indication:  Right knee septic arthritis  Last Day of Therapy:   04/10/24 Labs - Once weekly:  CBC/D and BMP, Labs - Once weekly: ESR and CRP 03/02/24 04/10/24  Calone, Gregory D, FNP  cinacalcet  (SENSIPAR ) 30 MG tablet Take 4 tablets (120 mg total) by mouth daily with breakfast. 03/04/24   Drusilla Sabas RAMAN, MD  clopidogrel  (PLAVIX ) 75 MG tablet Take 1 tablet (75 mg total) by mouth daily. 12/09/23 12/08/24  Sheree Penne Bruckner, MD  cyclobenzaprine  (FLEXERIL ) 10 MG tablet Take 1 tablet (10 mg total) by mouth 3 (three) times daily as needed for muscle spasms. Patient not taking: Reported on 02/11/2024 05/30/23   Lue Elsie BROCKS, MD  Darbepoetin Alfa  (ARANESP ) 100 MCG/0.5ML SOSY injection Inject 0.5 mLs (100 mcg total) into the  skin every Monday at 6 PM. 03/09/24   Drusilla Sabas RAMAN, MD  diphenhydrAMINE  (BENADRYL ) 25 mg capsule Take 1 capsule (25 mg total) by mouth every 6 (six) hours as needed for itching. 07/14/23   Love, Sharlet RAMAN, PA-C  docusate sodium  (COLACE) 100 MG capsule Take 1 capsule (100 mg total) by mouth 2 (two) times daily. 03/03/24   Drusilla Sabas RAMAN, MD  ferrous sulfate  325 (65 FE) MG tablet Take 1 tablet (325 mg total) by mouth daily with breakfast. 03/04/24   Drusilla Sabas RAMAN, MD  folic acid  (FOLVITE ) 1 MG tablet Take 1 tablet (1 mg total) by mouth daily. 03/04/24   Drusilla Sabas RAMAN, MD  gabapentin  (NEURONTIN ) 100 MG capsule Take 3 capsules (300 mg total) by mouth at bedtime. 05/30/23   Lue Elsie BROCKS, MD  heparin  sodium, porcine, 1000 UNIT/ML injection Inject 3,000 Units into the vein See admin instructions. Medication provided and administered by [facility] dialysis clinic. Bolus; IV push 3000u with every dialysis treatment 3 times per week (total treatment 240 minutes).    [provider]  insulin  aspart (NOVOLOG ) 100 UNIT/ML injection Inject 0-6 Units into the skin 3 (three) times daily with meals. 03/03/24   Drusilla Sabas RAMAN, MD  lidocaine  (LIDODERM ) 5 % Place 1 patch onto the skin daily. Remove & Discard patch within 12 hours 02/07/24   Hermanns,  Ashlee P, PA-C  melatonin 5 MG TABS Take 1 tablet (5 mg total) by mouth at bedtime as needed. 03/03/24   Drusilla Sabas RAMAN, MD  midodrine  (PROAMATINE ) 10 MG tablet Take 1 tablet (10 mg total) by mouth 3 (three) times daily with meals. 03/03/24   Drusilla Sabas RAMAN, MD  Misc Natural Products (TURMERIC, CURCUMIN, PO) Take 1 tablet by mouth daily.    [provider]  multivitamin (RENA-VIT) TABS tablet Take 1 tablet by mouth daily.    [provider]  Nutritional Supplements (,FEEDING SUPPLEMENT, PROSOURCE PLUS) liquid Take 30 mLs by mouth 2 (two) times daily between meals. 03/03/24   Drusilla Sabas RAMAN, MD  Nutritional Supplements (FEEDING SUPPLEMENT, KATE FARMS STANDARD 1.4,) LIQD liquid Take 325 mLs by mouth daily. 03/03/24   Drusilla Sabas RAMAN, MD  ondansetron  (ZOFRAN ) 4 MG tablet Take 1 tablet (4 mg total) by mouth every 6 (six) hours as needed for nausea. 03/03/24   Drusilla Sabas RAMAN, MD  oxyCODONE -acetaminophen  (PERCOCET/ROXICET) 5-325 MG tablet Take 1 tablet by mouth every 4 (four) hours as needed for severe pain (pain score 7-10) or moderate pain (pain score 4-6). 03/03/24   Drusilla Sabas RAMAN, MD  pantoprazole  (PROTONIX ) 40 MG tablet Take 1 tablet (40 mg total) by mouth 2 (two) times daily before a meal. 03/03/24   Drusilla, Sabas RAMAN, MD  Psyllium (METAMUCIL PO) Take 1 Scoop by mouth daily as needed (GI issues).    [provider]  sevelamer  carbonate (RENVELA ) 800 MG tablet Take 3,200 mg by mouth 3 (three) times daily with meals. 06/12/21   [provider]  vancomycin  IVPB Inject 1,000 mg into the vein every Monday, Wednesday, and Friday with hemodialysis. Indication:  Right knee septic arthritis  Last Day of Therapy:  04/10/24  Labs - pnce weekly :  CBC/D, BMP, ESR, CRP and vancomycin  trough. 03/02/24 04/10/24  Calone, Gregory D, FNP   Current Facility-Administered Medications  Medication Dose Route Frequency Provider Last Rate Last Admin   (feeding supplement) PROSource Plus liquid 30 mL  30 mL  Oral BID BM Love, Pamela S, PA-C  30 mL at 03/04/24 1300   acetaminophen  (TYLENOL ) tablet 325-650 mg  325-650 mg Oral Q4H PRN Love, Pamela S, PA-C       aspirin  EC tablet 81 mg  81 mg Oral Daily Love, Pamela S, PA-C   81 mg at 03/04/24 0817   atorvastatin  (LIPITOR) tablet 10 mg  10 mg Oral Daily Love, Pamela S, PA-C   10 mg at 03/04/24 0814   bisacodyl  (DULCOLAX) suppository 10 mg  10 mg Rectal Daily PRN Love, Pamela S, PA-C       camphor-menthol  (SARNA) lotion   Topical PRN Lovorn, Duwaine, MD       ceFEPIme  (MAXIPIME ) 2 g in sodium chloride  0.9 % 100 mL IVPB  2 g Intravenous Q M,W,F-HD Love, Pamela S, PA-C       [START ON 03/05/2024] Chlorhexidine  Gluconate Cloth 2 % PADS 6 each  6 each Topical Q0600 Lenon Charmaine BRAVO, NP       cinacalcet  (SENSIPAR ) tablet 120 mg  120 mg Oral Q breakfast Love, Pamela S, PA-C   120 mg at 03/04/24 9185   clopidogrel  (PLAVIX ) tablet 75 mg  75 mg Oral Daily Love, Pamela S, PA-C   75 mg at 03/04/24 9185   collagenase  (SANTYL ) ointment   Topical Daily Lovorn, Megan, MD   Given at 03/04/24 9183   cyclobenzaprine  (FLEXERIL ) tablet 10 mg  10 mg Oral TID PRN Love, Pamela S, PA-C       [START ON 03/09/2024] Darbepoetin Alfa  (ARANESP ) injection 100 mcg  100 mcg Subcutaneous Q Mon-1800 Love, Pamela S, PA-C       diphenhydrAMINE  (BENADRYL ) capsule 25 mg  25 mg Oral Q6H PRN Love, Pamela S, PA-C       docusate sodium  (COLACE) capsule 100 mg  100 mg Oral BID Love, Pamela S, PA-C   100 mg at 03/04/24 0815   feeding supplement (KATE FARMS STANDARD 1.4) liquid 325 mL  325 mL Oral Daily Love, Pamela S, PA-C   325 mL at 03/04/24 1300   ferrous sulfate  tablet 325 mg  325 mg Oral Q breakfast Love, Pamela S, PA-C   325 mg at 03/04/24 0815   folic acid  (FOLVITE ) tablet 1 mg  1 mg Oral Daily Love, Pamela S, PA-C   1 mg at 03/04/24 0815   gabapentin  (NEURONTIN ) capsule 300 mg  300 mg Oral QHS Love, Pamela S, PA-C   300 mg at 03/03/24 2110   guaiFENesin -dextromethorphan (ROBITUSSIN DM)  100-10 MG/5ML syrup 5-10 mL  5-10 mL Oral Q6H PRN Love, Pamela S, PA-C       heparin  injection 5,000 Units  5,000 Units Subcutaneous Q8H Love, Pamela S, PA-C   5,000 Units at 03/04/24 1300   hydrocerin (EUCERIN) cream   Topical BID Love, Pamela S, PA-C   Given at 03/04/24 9183   insulin  aspart (novoLOG ) injection 0-6 Units  0-6 Units Subcutaneous TID WC Love, Pamela S, PA-C       lidocaine  (LIDODERM ) 5 % 1 patch  1 patch Transdermal Q24H Maurice Sharlet RAMAN, PA-C   1 patch at 03/03/24 1810   midodrine  (PROAMATINE ) tablet 10 mg  10 mg Oral TID WC Love, Pamela S, PA-C   10 mg at 03/04/24 1157   midodrine  (PROAMATINE ) tablet 10 mg  10 mg Oral PRN Love, Pamela S, PA-C       milk and molasses enema  1 enema Rectal Daily PRN Love, Pamela S, PA-C       multivitamin (RENA-VIT) tablet 1 tablet  1 tablet Oral QHS Maurice Sharlet RAMAN, PA-C   1 tablet at 03/03/24 2102   oxyCODONE -acetaminophen  (PERCOCET/ROXICET) 5-325 MG per tablet 1 tablet  1 tablet Oral Q4H PRN Maurice Sharlet RAMAN, PA-C   1 tablet at 03/04/24 0815   pantoprazole  (PROTONIX ) EC tablet 40 mg  40 mg Oral BID AC Maurice Sharlet RAMAN, PA-C   40 mg at 03/04/24 9371   polyethylene glycol (MIRALAX  / GLYCOLAX ) packet 17 g  17 g Oral Daily PRN Maurice Sharlet S, PA-C       prochlorperazine  (COMPAZINE ) tablet 5-10 mg  5-10 mg Oral Q6H PRN Love, Pamela S, PA-C       Or   prochlorperazine  (COMPAZINE ) suppository 12.5 mg  12.5 mg Rectal Q6H PRN Love, Pamela S, PA-C       Or   prochlorperazine  (COMPAZINE ) injection 5-10 mg  5-10 mg Intravenous Q6H PRN Love, Pamela S, PA-C       psyllium (HYDROCIL/METAMUCIL) 1 packet  1 packet Oral Daily Maurice Sharlet RAMAN, PA-C   1 packet at 03/04/24 0815   selenium  sulfide (SELSUN ) 1 % shampoo 1 Application  1 Application Topical Daily Babs Arthea DASEN, MD       sevelamer  carbonate (RENVELA ) tablet 2,400 mg  2,400 mg Oral TID WC Love, Pamela S, PA-C   2,400 mg at 03/04/24 1157   sorbitol  70 % solution 60 mL  60 mL Oral Daily PRN Love, Pamela S,  PA-C       vancomycin  (VANCOCIN ) IVPB 1000 mg/200 mL premix  1,000 mg Intravenous Q M,W,F-HD Maurice Sharlet RAMAN, PA-C       Labs: Basic Metabolic Panel: Recent Labs  Lab 03/02/24 0420 03/03/24 0149 03/04/24 0534  NA 132* 132* 130*  K 5.2* 4.5 4.8  CL 91* 90* 88*  CO2 25 29 27   GLUCOSE 86 92 85  BUN 66* 38* 55*  CREATININE 8.34* 5.87* 7.84*  CALCIUM  9.4 9.4 9.9  PHOS 4.1 3.0 3.8   Liver Function Tests: Recent Labs  Lab 02/27/24 0301 02/28/24 0316 03/02/24 0420 03/03/24 0149 03/04/24 0534  AST 18  --   --   --   --   ALT <5  --   --   --   --   ALKPHOS 98  --   --   --   --   BILITOT 0.4  --   --   --   --   PROT 7.8  --   --   --   --   ALBUMIN  3.0*   < > 2.9* 2.9* 2.9*   < > = values in this interval not displayed.   No results for input(s): LIPASE, AMYLASE in the last 168 hours. No results for input(s): AMMONIA in the last 168 hours. CBC: Recent Labs  Lab 02/27/24 0301 02/28/24 0316 02/28/24 0731 02/28/24 1501  WBC 6.2 7.0 8.0  --   NEUTROABS  --   --  5.6  --   HGB 7.8* 8.3* 7.3* 8.2*  HCT 24.3* 26.2* 22.3* 24.0*  MCV 91.0 90.0 88.5  --   PLT 335 324 350  --    Cardiac Enzymes: No results for input(s): CKTOTAL, CKMB, CKMBINDEX, TROPONINI in the last 168 hours. CBG: Recent Labs  Lab 03/03/24 1221 03/03/24 1608 03/03/24 2104 03/04/24 0628 03/04/24 1121  GLUCAP 106* 96 107* 86 78   Iron Studies: No results for input(s): IRON, TIBC, TRANSFERRIN, FERRITIN in the last 72 hours. Studies/Results: No results found.  ROS: All others  negative except those listed in HPI.   Physical Exam: Vitals:   03/03/24 1956 03/04/24 0625 03/04/24 0712 03/04/24 1240  BP: 118/66 118/70  120/76  Pulse: 80 79  76  Resp: 18 18  16   Temp: 98.2 F (36.8 C) 98.1 F (36.7 C)  97.7 F (36.5 C)  TempSrc: Oral Oral  Oral  SpO2: 100% 98%  100%  Weight:   86.5 kg   Height:         General: WDWN NAD Head: NCAT sclera not icteric  Lungs: CTA  bilaterally. No wheeze, rales or rhonchi. Breathing is unlabored. Heart: RRR. No murmur, rubs or gallops.  Lower extremities: No LE edema Neuro: AAOx3. Moves all extremities spontaneously. Dialysis Access: AVF  Dialysis Orders:  MWF NW 4h  B400  87.3kg  2K bath  AVF   Heparin  3000 ESA: Mircera 50 q 2 wks (last 12/15) BMM: Calcitriol  2 mcg PO q HD, cinacalcet  120 mg daily   Assessment/Plan:  ESRD -on HD MWF. Next HD this afternoon.     Volume/chronic hypotension:  -HD with midodrine  support, UF as tolerated   Anemia of ESRD:  -declines IV alb/ prbc's, hold off on IV Fe, increased ESA dose (hgb 8.2)   Hyperkalemia:  -persistent, mild, had been on lokelma --on hold for now, can resume if needed, k remains wnl   Septic arthritis R knee and right shoulder:  -SP I&D 1/09 per ortho, refused IV so getting po abx--per primary. S/p fluid collection of right shoulder 1/13, highly infectious. ID and ortho following, s/p right shoulder I&D with abx spacer placement -Discussed with pharmacy: plan to continue Vancomycin  IV 1GM and Cefepime  IV 2GM both with HD until 04/10/24.   Otitis media w/ TM perforation:  -s/p course of abx, ENT has seen   R sided weakness:  -main c/o on admission, CVA w/u was negative   Secondary hyperparathyroidism:  -phos at goal   Dispo -In CIR  Charmaine Piety, NP [facility] 03/04/2024, 12:59 PM      [1]  Allergies Allergen Reactions   Infed [Iron Dextran] Other (See Comments)    Decreased BP    Vicodin [Hydrocodone -Acetaminophen ] Other (See Comments)    Decreased BP   Hydrocodone  Other (See Comments)    Unable to recall reaction   Voltaren [Diclofenac] Other (See Comments)    Hypotension

## 2024-03-04 NOTE — Progress Notes (Signed)
 Patient ID: Carl Raborn Sr., male   DOB: Oct 25, 1960, 64 y.o.   MRN: [id number] Met with the patient to review current medical situation, post infection in right knee and shoulder with I+D knee 02/18/24 and arthroscopic I+D right shoulder with abx. beads implanted on IV  abx in HD through 04/10/24; admitted with debility. Discussed medications and skin care with renal diet and 1200 cc FR. Sutures to right knee and surgical dressing right shoulder with unstage-able wound on sacrum. Wound treatment plan initiated, WOC consulted and dietician consulted. Continue to follow along to address educational needs to facilitate preparation for discharge. Fredericka Barnie NOVAK

## 2024-03-05 DIAGNOSIS — R5381 Other malaise: Secondary | ICD-10-CM | POA: Diagnosis not present

## 2024-03-05 DIAGNOSIS — Z992 Dependence on renal dialysis: Secondary | ICD-10-CM | POA: Diagnosis not present

## 2024-03-05 DIAGNOSIS — M009 Pyogenic arthritis, unspecified: Secondary | ICD-10-CM | POA: Diagnosis not present

## 2024-03-05 DIAGNOSIS — L89150 Pressure ulcer of sacral region, unstageable: Secondary | ICD-10-CM | POA: Diagnosis not present

## 2024-03-05 DIAGNOSIS — N186 End stage renal disease: Secondary | ICD-10-CM | POA: Diagnosis not present

## 2024-03-05 DIAGNOSIS — I739 Peripheral vascular disease, unspecified: Secondary | ICD-10-CM | POA: Diagnosis not present

## 2024-03-05 LAB — RENAL FUNCTION PANEL
Albumin: 2.8 g/dL — ABNORMAL LOW (ref 3.5–5.0)
Anion gap: 14 (ref 5–15)
BUN: 32 mg/dL — ABNORMAL HIGH (ref 8–23)
CO2: 26 mmol/L (ref 22–32)
Calcium: 9.3 mg/dL (ref 8.9–10.3)
Chloride: 92 mmol/L — ABNORMAL LOW (ref 98–111)
Creatinine, Ser: 5.5 mg/dL — ABNORMAL HIGH (ref 0.61–1.24)
GFR, Estimated: 11 mL/min — ABNORMAL LOW
Glucose, Bld: 83 mg/dL (ref 70–99)
Phosphorus: 2.9 mg/dL (ref 2.5–4.6)
Potassium: 4.2 mmol/L (ref 3.5–5.1)
Sodium: 132 mmol/L — ABNORMAL LOW (ref 135–145)

## 2024-03-05 LAB — GLUCOSE, CAPILLARY
Glucose-Capillary: 76 mg/dL (ref 70–99)
Glucose-Capillary: 124 mg/dL — ABNORMAL HIGH (ref 70–99)
Glucose-Capillary: 133 mg/dL — ABNORMAL HIGH (ref 70–99)
Glucose-Capillary: 140 mg/dL — ABNORMAL HIGH (ref 70–99)

## 2024-03-05 MED ORDER — CARBAMIDE PEROXIDE 6.5 % OT SOLN
10.0000 [drp] | Freq: Every day | OTIC | Status: AC
  Administered 2024-03-05 – 2024-03-20 (×14): 10 [drp] via OTIC
  Filled 2024-03-05 (×2): qty 15

## 2024-03-05 MED ORDER — CHLORHEXIDINE GLUCONATE CLOTH 2 % EX PADS
6.0000 | MEDICATED_PAD | Freq: Every day | CUTANEOUS | Status: DC
  Administered 2024-03-06 – 2024-03-13 (×8): 6 via TOPICAL

## 2024-03-05 NOTE — Progress Notes (Signed)
 " Inpatient Rehabilitation Care Coordinator Assessment and Plan Patient Details  Name: Carl Maday Sr. MRN: [id number] Date of Birth: 04-18-1960  Today's Date: 03/05/2024  Hospital Problems: Principal Problem:   Debility  Past Medical History:  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    Parathyroid  abnormality    ectopic parathyroid  gland   Presence of arteriovenous fistula for hemodialysis, primary    RUE PER PT RLE   Refusal of blood product    NO WHOLE BLOOD PROUCTS   Renal cell carcinoma (HCC)    s/p hand assisted laparoscopic bilateral nephrectomies 11/29/17, + RCC left   Secondary hyperparathyroidism    Seizures (HCC)    one episode in past, due to elevated Potassium 08/02/20- at least 4 years ago   Sleep apnea    doesn't use CPAP anymore since weight loss   Stroke [facility])    no residual   Past Surgical History:  Past Surgical History:  Procedure Laterality Date   ABDOMINAL AORTOGRAM W/LOWER EXTREMITY N/A 12/09/2023   Procedure: ABDOMINAL AORTOGRAM W/LOWER EXTREMITY;  Surgeon: Sheree Penne Bruckner, MD;  Location: [facility] INVASIVE CV LAB;  Service: Cardiovascular;  Laterality: N/A;   ANTERIOR CERVICAL CORPECTOMY N/A 05/25/2023   Procedure: ANTERIOR CERVICAL CORPECTOMY CERVICAL FOUR, CERVICAL THREE - CERVICAL FIVE FUSION;  Surgeon: [name] Shove, MD;  Location: [facility] OR;  Service: Neurosurgery;  Laterality: N/A;   AV FISTULA PLACEMENT Right     right arm   BIOPSY  08/01/2019   Procedure: BIOPSY;  Surgeon: Kristie Lamprey, MD;  Location: [facility] ENDOSCOPY;  Service: Endoscopy;;   BIOPSY  11/18/2020   Procedure: BIOPSY;  Surgeon: Stacia Glendia BRAVO, MD;  Location: [facility] ENDOSCOPY;  Service: Gastroenterology;;   BIOPSY  09/13/2021   Procedure: BIOPSY;  Surgeon: Wilhelmenia Aloha Raddle., MD;  Location: [facility] ENDOSCOPY;  Service: Gastroenterology;;   CATARACT EXTRACTION W/ INTRAOCULAR LENS  IMPLANT, BILATERAL     COLON SURGERY     COLONOSCOPY N/A 08/04/2015   Procedure: COLONOSCOPY;  Surgeon: Gordy CHRISTELLA Starch, MD;  Location: [facility] ENDOSCOPY;  Service: Endoscopy;  Laterality: N/A;   COLONOSCOPY  2017   JMP@ [facility]-good prep-mass -recall 1 yr   COLONOSCOPY N/A 09/13/2021   Procedure: COLONOSCOPY;  Surgeon: Mansouraty, Aloha Raddle., MD;  Location: [facility] ENDOSCOPY;  Service: Gastroenterology;  Laterality: N/A;   COLONOSCOPY WITH PROPOFOL  N/A 11/25/2020   Procedure: COLONOSCOPY WITH PROPOFOL ;  Surgeon: Federico Rosario BROCKS, MD;  Location: [facility] ENDOSCOPY;  Service: Gastroenterology;  Laterality: N/A;   COLONOSCOPY WITH PROPOFOL  N/A 09/23/2021   Procedure: COLONOSCOPY WITH PROPOFOL ;  Surgeon: San Sandor GAILS, DO;  Location: [facility] ENDOSCOPY;  Service: Gastroenterology;  Laterality: N/A;   ENTEROSCOPY N/A 09/23/2021   Procedure: ENTEROSCOPY;  Surgeon: San Sandor GAILS, DO;  Location: [facility] ENDOSCOPY;  Service: Gastroenterology;  Laterality: N/A;  Will place order for video capsule study as we may opt to place it during procedure   ESOPHAGOGASTRODUODENOSCOPY N/A 08/01/2019   Procedure: ESOPHAGOGASTRODUODENOSCOPY (EGD);  Surgeon: Kristie Lamprey, MD;  Location: [facility] ENDOSCOPY;  Service: Endoscopy;  Laterality: N/A;   ESOPHAGOGASTRODUODENOSCOPY N/A 11/18/2020   Procedure: ESOPHAGOGASTRODUODENOSCOPY (EGD);  Surgeon: Stacia Glendia BRAVO, MD;  Location: [facility] ENDOSCOPY;  Service: Gastroenterology;  Laterality: N/A;   ESOPHAGOGASTRODUODENOSCOPY (EGD) WITH PROPOFOL  N/A 08/04/2019   Procedure:  ESOPHAGOGASTRODUODENOSCOPY (EGD) WITH PROPOFOL ;  Surgeon: Leigh Elspeth SQUIBB, MD;  Location: [facility] ENDOSCOPY;  Service: Gastroenterology;  Laterality: N/A;   ESOPHAGOGASTRODUODENOSCOPY (EGD) WITH PROPOFOL  N/A 09/13/2021   Procedure: ESOPHAGOGASTRODUODENOSCOPY (EGD) WITH PROPOFOL ;  Surgeon: Wilhelmenia Aloha Raddle., MD;  Location: [facility] ENDOSCOPY;  Service: Gastroenterology;  Laterality: N/A;   GIVENS CAPSULE STUDY N/A 09/23/2021   Procedure: GIVENS CAPSULE STUDY;  Surgeon: San Sandor GAILS, DO;  Location: [facility] ENDOSCOPY;  Service: Gastroenterology;  Laterality: N/A;   graft left arm Left    for dialysis x 2. Removed   HARDWARE REMOVAL Right 02/28/2024   Procedure: RIGHT SHOULDER ARTHROSCOPIC WASHOUT AND PLACEMENT OF ANTIBIOTIC BEADS;  Surgeon: [name] Cordella Glendia, MD;  Location: [facility] OR;  Service: Orthopedics;  Laterality: Right;  RIGHT SHOULDER HARDWARE REMOVAL, PLACEMENT OF ANTIBIOTIC SPACER   HOT HEMOSTASIS  11/18/2020   Procedure: HOT HEMOSTASIS (ARGON PLASMA COAGULATION/BICAP);  Surgeon: Stacia Glendia BRAVO, MD;  Location: [facility] ENDOSCOPY;  Service: Gastroenterology;;   HOT HEMOSTASIS N/A 09/23/2021   Procedure: HOT HEMOSTASIS (ARGON PLASMA COAGULATION/BICAP);  Surgeon: San Sandor GAILS, DO;  Location: [facility] ENDOSCOPY;  Service: Gastroenterology;  Laterality: N/A;   INSERTION OF DIALYSIS CATHETER     Rt chest   IR US  GUIDE BX ASP/DRAIN  02/25/2024   KNEE ARTHROSCOPY Right 02/21/2024   Procedure: ARTHROSCOPY, KNEE;  Surgeon: [name] Kay HERO, MD;  Location: [facility] OR;  Service: Orthopedics;  Laterality: Right;   LAPAROSCOPIC RIGHT COLECTOMY N/A 08/05/2015   Procedure: LAPAROSCOPIC RIGHT COLECTOMY- ASCENDING;  Surgeon: Jina Nephew, MD;  Location: [facility] OR;  Service: General;  Laterality: N/A;   LOWER EXTREMITY ANGIOGRAPHY N/A 12/09/2023   Procedure: Lower Extremity Angiography;  Surgeon: Sheree Penne Bruckner, MD;  Location: [facility] INVASIVE CV LAB;  Service: Cardiovascular;  Laterality: N/A;   LOWER EXTREMITY  INTERVENTION N/A 12/09/2023   Procedure: LOWER EXTREMITY INTERVENTION;  Surgeon: Sheree Penne Bruckner, MD;  Location: [facility] INVASIVE CV LAB;  Service: Cardiovascular;  Laterality: N/A;   MASS EXCISION Left 05/28/2019   Procedure: EXCISION SOFT TISSUE MASS LEFT SHOULDER;  Surgeon: Eletha Boas, MD;  Location: [facility] ORS;  Service: General;  Laterality: Left;   NEPHRECTOMY Bilateral    PARATHYROIDECTOMY N/A 06/12/2016   Procedure: TOTAL PARATHYROIDECTOMY WITH AUTOTRANSPLANTATION TO LEFT FOREARM;  Surgeon: Boas Eletha, MD;  Location: [facility] OR;  Service: General;  Laterality: N/A;   PARATHYROIDECTOMY N/A 10/23/2016   Procedure: PARATHYROIDECTOMY;  Surgeon: Eletha Boas, MD;  Location: [facility] OR;  Service: General;  Laterality: N/A;   REVERSE SHOULDER ARTHROPLASTY Right 08/24/2020   Procedure: REVERSE SHOULDER ARTHROPLASTY;  Surgeon: Cristy Bonner DASEN, MD;  Location: [facility] OR;  Service: Orthopedics;  Laterality: Right;   REVISON OF ARTERIOVENOUS FISTULA Right 07/16/2017   Procedure: REVISION OF ARTERIOVENOUS FISTULA  Right ARM;  Surgeon: Sheree Penne Bruckner, MD;  Location: [facility] OR;  Service: Vascular;  Laterality: Right;   TOTAL HIP ARTHROPLASTY Left 11/14/2020   Procedure: LEFT TOTAL HIP ARTHROPLASTY ANTERIOR APPROACH;  Surgeon: [name] Kay HERO, MD;  Location: [facility] OR;  Service: Orthopedics;  Laterality: Left;   UPPER  GASTROINTESTINAL ENDOSCOPY  2021   @ [facility]   Social History:  reports that he quit smoking about 33 years ago. His smoking use included cigarettes. He has never used smokeless tobacco. He reports that he does not currently use alcohol after a past usage of about 1.0 standard drink of alcohol per week. He reports that he does not currently use drugs after having used the following drugs: Marijuana.  Family / Support Systems Marital Status: Single Patient Roles: Parent Spouse/Significant Other: N/A Children: Som Macell (68 yrs old); pt lives with his son Other Supports: None Anticipated Caregiver:  son Marcell Ability/Limitations of Caregiver: Pt will d/c ot home with son Marcell who will provide care Caregiver Availability: 24/7 Family Dynamics: Pt lives wit his son Marcell in first floor apartment  Social History Preferred language: English Religion: Jehovah's Witness Cultural Background: Pt retired 17 yrs ago due to renal failure. He is not a cytogeneticist. Education: 12th grade; did not graduate highschool Health Literacy - How often do you need to have someone help you when you read instructions, pamphlets, or other written material from your doctor or pharmacy?: Never Writes: Yes Employment Status: Disabled Marine Scientist Issues: Denies Guardian/Conservator: No HCPOA   Abuse/Neglect Abuse/Neglect Assessment Can Be Completed: Yes Physical Abuse: Denies Verbal Abuse: Denies Sexual Abuse: Denies Exploitation of patient/patient's resources: Denies Self-Neglect: Denies  Patient response to: Social Isolation - How often do you feel lonely or isolated from those around you?: Never  Emotional Status Pt's affect, behavior and adjustment status: Pt in good spirits at time of visit Recent Psychosocial Issues: Denies Psychiatric History: Denies Substance Abuse History: Pt reports eoth use on occassion. Admits he has used marijuana in the past, and last used one year ago.  Patient / Family Perceptions, Expectations & Goals Pt/Family understanding of illness & functional limitations: Pt and family have a general understanding of care needs Premorbid pt/family roles/activities: Independent; was driving self to/from dialysis Anticipated changes in roles/activities/participation: Assistance with ADLs/IADLs Pt/family expectations/goals: Pt goal is to work on getting back to walking.  Community Resources [employer]: None Premorbid Home Care/DME Agencies: None Transportation available at discharge: TBD Is the patient able to respond to transportation needs?: Yes In  the past 12 months, has lack of transportation kept you from medical appointments or from getting medications?: No In the past 12 months, has lack of transportation kept you from meetings, work, or from getting things needed for daily living?: No Resource referrals recommended: Neuropsychology  Discharge Planning Living Arrangements: Children Support Systems: Children Type of Residence: Private residence Insurance Resources: Media Planner (specify) (UHC Medicare; secondary Medicaid) Financial Resources: Restaurant Manager, Fast Food Screen Referred: No Living Expenses: Banker Management: Patient Does the patient have any problems obtaining your medications?: No Home Management: Pt and son managed home care needs Patient/Family Preliminary Plans: Pt son Care Coordinator Barriers to Discharge: Decreased caregiver support, Lack of/limited family support, Insurance for SNF coverage Care Coordinator Anticipated Follow Up Needs: HH/OP  Clinical Impression Late entry due to dialysis screen change.  SW introduced self, explained role, discussed discharge process. He is not a cytogeneticist. No HCPOA. DME- RW, w/c, cane, 3in1 BSC/used over toilet, has shower chair but does not fit, and as a recliner.   Tinaya Ceballos A Rosaria Kubin 03/05/2024, 3:26 PM    "

## 2024-03-05 NOTE — Care Management (Signed)
 Inpatient Rehabilitation Center Individual Statement of Services  Patient Name:  Carl Graffam Sr.  Date:  03/05/2024  Welcome to the Inpatient Rehabilitation Center.  Our goal is to provide you with an individualized program based on your diagnosis and situation, designed to meet your specific needs.  With this comprehensive rehabilitation program, you will be expected to participate in at least 3 hours of rehabilitation therapies Monday-Friday, with modified therapy programming on the weekends.  Your rehabilitation program will include the following services:  Physical Therapy (PT), Occupational Therapy (OT), 24 hour per day rehabilitation nursing, Therapeutic Recreaction (TR), Psychology, Neuropsychology, Care Coordinator, Rehabilitation Medicine, Nutrition Services, Pharmacy Services, and Other  Weekly team conferences will be held on Tuesday to discuss your progress.  Your Inpatient Rehabilitation Care Coordinator will talk with you frequently to get your input and to update you on team discussions.  Team conferences with you and your family in attendance may also be held.  Expected length of stay: 14-21 days  Overall anticipated outcome: Supervision  Depending on your progress and recovery, your program may change. Your Inpatient Rehabilitation Care Coordinator will coordinate services and will keep you informed of any changes. Your Inpatient Rehabilitation Care Coordinator's name and contact numbers are listed  below.  The following services may also be recommended but are not provided by the Inpatient Rehabilitation Center:  Driving Evaluations Home Health Rehabiltiation Services Outpatient Rehabilitation Services Vocational Rehabilitation   Arrangements will be made to provide these services after discharge if needed.  Arrangements include referral to agencies that provide these services.  Your insurance has been verified to be:  [facility] Medicare  Your primary doctor is:  Dibas  Koirala  Pertinent information will be shared with your doctor and your insurance company.  Inpatient Rehabilitation Care Coordinator:  [name], [name] [phone] or [phone]  Information discussed with and copy given to patient by: Graeme DELENA Jude, 03/05/2024, 8:59 AM

## 2024-03-05 NOTE — Plan of Care (Signed)
" °  Problem: Consults Goal: RH GENERAL PATIENT EDUCATION Description: See Patient Education module for education specifics. Outcome: Progressing Goal: Nutrition Consult-if indicated Outcome: Progressing Goal: Diabetes Guidelines if Diabetic/Glucose > 140 Description: If diabetic or lab glucose is > 140 mg/dl - Initiate Diabetes/Hyperglycemia Guidelines & Document Interventions  Outcome: Progressing   Problem: RH BOWEL ELIMINATION Goal: RH STG MANAGE BOWEL WITH ASSISTANCE Description: STG Manage Bowel with mod I Assistance. Outcome: Progressing Goal: RH STG MANAGE BOWEL W/MEDICATION W/ASSISTANCE Description: STG Manage Bowel with Medication with mod I Assistance. Outcome: Progressing   Problem: RH SAFETY Goal: RH STG ADHERE TO SAFETY PRECAUTIONS W/ASSISTANCE/DEVICE Description: STG Adhere to Safety Precautions With cues Assistance/Device. Outcome: Progressing   Problem: RH PAIN MANAGEMENT Goal: RH STG PAIN MANAGED AT OR BELOW PT'S PAIN GOAL Description: Pain < 4 with prns Outcome: Progressing   Problem: RH KNOWLEDGE DEFICIT GENERAL Goal: RH STG INCREASE KNOWLEDGE OF SELF CARE AFTER HOSPITALIZATION Description: Patient and son will be able to manage care at discharge using educational resources independently Outcome: Progressing   "

## 2024-03-05 NOTE — Progress Notes (Signed)
 Patient ID: Carl Sopko Sr., male   DOB: 06/03/1960, 64 y.o.   MRN: [id number]  [id number] spoke with pt son [name] to inform on ELOS. SW will follow-up with updates after team conference.   [name], MSW, LCSW Office: [phone] Cell: [phone] Fax: [phone]

## 2024-03-05 NOTE — Progress Notes (Signed)
 Physical Therapy Session Note  Patient Details  Name: Carl Worthington Sr. MRN: [id number] Date of Birth: 02-18-1960  Today's Date: 03/05/2024 PT Individual Time: 1305-1400 PT Individual Time Calculation (min): 55 min   Short Term Goals: Week 1:  PT Short Term Goal 1 (Week 1): pt will perform sit to stand with RW and max A PT Short Term Goal 2 (Week 1): pt will perform bed to chair transfer with LRAD and max A PT Short Term Goal 3 (Week 1): Pt will tolerate standing upright for ~ 2 min  Skilled Therapeutic Interventions/Progress Updates: Pt presented in PWC agreeable to therapy. Pt c/o pain in RUE and RLE. Pt's LE elevated with gait belt to maintain placement on leg rests. PTA initiated session performing gentle stretching and ROM on RLE with PTA taking increased time to work on ROM of RLE. PTA measured knee at joint line 45cm.  Pt then navigaed to day room and set up at [employer], participated in 3 bouts of 2 min ea with x 2 sets at 70cm/sec and last set at 60cm/sec. PTA providing minA to initiate movement but pt was able to improve ROM of knee. Pt then navigated to ain gym and set up outside of parallel bars. Pt able to stand with maxA (+2 present for safety). Pt able to tolerate ~1 min with PTA providing facilitation for weight shifting and increased pressure on RLE. Pt attempted second stand however unable to tolerate as pt stating increased pain in L knee. Pt was able to complete knee flexion/extension with RLE x 8 before placing on leg rest. Pt navigated back to room at end of session and remained in PWC with lunch tray set up, call bell within reach and needs met.      Therapy Documentation Precautions:  Precautions Precautions: Fall Precaution/Restrictions Comments: watch HR, R knee buckles, spinal Restrictions Weight Bearing Restrictions Per Provider Order: Yes RUE Weight Bearing Per Provider Order: Weight bearing as tolerated RLE Weight Bearing Per Provider Order: Weight bearing  as tolerated Other Position/Activity Restrictions: Per Dr. [name] on 1/19 pt can perform ROM and WB through RUE as tolerated    Therapy/Group: Individual Therapy  Halla Chopp 03/05/2024, 2:46 PM

## 2024-03-05 NOTE — Progress Notes (Signed)
 Occupational Therapy Session Note  Patient Details  Name: Carl Dunaj Sr. MRN: [id number] Date of Birth: 1961-01-28  Today's Date: 03/05/2024 OT Individual Time: 1000-1100 OT Individual Time Calculation (min): 60 min    Short Term Goals: Week 1:  OT Short Term Goal 1 (Week 1): Pt will increase ROM of Rt shoulder by 10* OT Short Term Goal 2 (Week 1): pt will complete sit to stand at Mod A using LRAD OT Short Term Goal 3 (Week 1): Pt will stand with Mod A for 1 min< with LRAD in prep for ADLs  Skilled Therapeutic Interventions/Progress Updates:      Therapy Documentation Precautions:  Precautions Precautions: Fall Precaution/Restrictions Comments: watch HR, R knee buckles, spinal Restrictions Weight Bearing Restrictions Per Provider Order: Yes RUE Weight Bearing Per Provider Order: Weight bearing as tolerated RLE Weight Bearing Per Provider Order: Weight bearing as tolerated Other Position/Activity Restrictions: Per Dr. [name] on 1/19 pt can perform ROM and WB through RUE as tolerated General:  Pt seated in W/C upon OT arrival, agreeable to OT.  Pain:  5/10 pain reported in Rt knee, activity, intermittent rest breaks, distractions provided for pain management, pt reports tolerable to proceed.   Exercises: Pt completed 10 minutes of arm bike in order to increase BUE/BLEstrength and endurance and shoulder ROM in preparation for increased independence in ADLs. Brief Rest break after 5 min, on level 1 resistance. Pt tolerating activity well with no c/o pain.    Pt completed the following exercise circuit in order to improve functional activity, strength and endurance to prepare for ADLs such as bathing. Pt completed the following exercises in reclined position with no noted LOB/SOB and 3x10 repetitions on each exercise with 1# dowel rod in order to focus on form: -bicep curls -chest press -shoulder flexion Pt with PRN rest breaks after sets for energy conservation.    Pt seated in  W/C at end of session with W/C alarm donned, call light within reach and 4Ps assessed.    Therapy/Group: Individual Therapy  Camie Hoe, OTD, OTR/L 03/05/2024, 12:40 PM

## 2024-03-05 NOTE — Discharge Instructions (Signed)
 Inpatient Rehab Discharge Instructions  Carl Heigl Sr. Discharge date and time:    Activities/Precautions/ Functional Status: Activity: activity as tolerated Diet: renal diet Wound Care: keep wound clean and dry   Functional status:  ___ No restrictions     ___ Walk up steps independently ___ 24/7 supervision/assistance   ___ Walk up steps with assistance ___ Intermittent supervision/assistance  ___ Bathe/dress independently ___ Walk with walker     ___ Bathe/dress with assistance ___ Walk Independently    ___ Shower independently ___ Walk with assistance    ___ Shower with assistance ___ No alcohol     ___ Return to work/school ________  Special Instructions:    My questions have been answered and I understand these instructions. I will adhere to these goals and the provided educational materials after my discharge from the hospital.  Patient/Caregiver Signature _______________________________ Date __________  Clinician Signature _______________________________________ Date __________  Please bring this form and your medication list with you to all your follow-up doctor's appointments.

## 2024-03-05 NOTE — Progress Notes (Signed)
 " [facility] Progress Note   Subjective:    Seen and examined patient at bedside. Tolerated yesterday's HD with net UF 1.5L. No issues today. Next HD 1/23.  Objective Vitals:   03/04/24 2114 03/05/24 0609 03/05/24 1153 03/05/24 1422  BP: 106/72 100/68 99/60 102/73  Pulse: 97 85  90  Resp: 17   20  Temp: 98.7 F (37.1 C) 98.9 F (37.2 C)  97.6 F (36.4 C)  TempSrc:  Oral  Oral  SpO2: 100% 96%  97%  Weight:      Height:       Physical Exam General: WDWN NAD Head: NCAT sclera not icteric  Lungs: CTA bilaterally. No wheeze, rales or rhonchi. Breathing is unlabored. Heart: RRR. No murmur, rubs or gallops.  Lower extremities: No LE edema Neuro: AAOx3.  Dialysis Access: AVF  [facility] Weights   03/04/24 0712 03/04/24 1342 03/04/24 1736  Weight: 86.5 kg 86.5 kg 85.7 kg    Intake/Output Summary (Last 24 hours) at 03/05/2024 1624 Last data filed at 03/05/2024 1000 Gross per 24 hour  Intake 240 ml  Output 1500 ml  Net -1260 ml    Additional Objective Labs: Basic Metabolic Panel: Recent Labs  Lab 03/03/24 0149 03/04/24 0534 03/05/24 0548  NA 132* 130* 132*  K 4.5 4.8 4.2  CL 90* 88* 92*  CO2 29 27 26   GLUCOSE 92 85 83  BUN 38* 55* 32*  CREATININE 5.87* 7.84* 5.50*  CALCIUM  9.4 9.9 9.3  PHOS 3.0 3.8 2.9   Liver Function Tests: Recent Labs  Lab 03/03/24 0149 03/04/24 0534 03/05/24 0548  ALBUMIN  2.9* 2.9* 2.8*   No results for input(s): LIPASE, AMYLASE in the last 168 hours. CBC: Recent Labs  Lab 02/28/24 0316 02/28/24 0731 02/28/24 1501  WBC 7.0 8.0  --   NEUTROABS  --  5.6  --   HGB 8.3* 7.3* 8.2*  HCT 26.2* 22.3* 24.0*  MCV 90.0 88.5  --   PLT 324 350  --    Blood Culture    Component Value Date/Time   SDES TISSUE RIGHT SHOULDER 02/28/2024 1636   SPECREQUEST SWAB C INTRA ARTICULAR SUTURE 02/28/2024 1636   CULT  02/28/2024 1636    No growth aerobically or anaerobically. Performed at [facility], [address],  [place], [name]    REPTSTATUS 03/04/2024 FINAL 02/28/2024 1636    Cardiac Enzymes: No results for input(s): CKTOTAL, CKMB, CKMBINDEX, TROPONINI in the last 168 hours. CBG: Recent Labs  Lab 03/04/24 1121 03/04/24 1849 03/04/24 2116 03/05/24 0611 03/05/24 1152  GLUCAP 78 78 83 76 124*   Iron Studies: No results for input(s): IRON, TIBC, TRANSFERRIN, FERRITIN in the last 72 hours. Lab Results  Component Value Date   INR 1.1 05/27/2023   INR 1.1 05/25/2023   INR 1.1 09/17/2021   Studies/Results: No results found.  Medications:  ceFEPime  (MAXIPIME ) IV Stopped (03/04/24 1856)   vancomycin  Stopped (03/04/24 1857)    (feeding supplement) PROSource Plus  30 mL Oral BID BM   aspirin  EC  81 mg Oral Daily   atorvastatin   10 mg Oral Daily   carbamide peroxide  10 drop Right EAR QPC supper   Chlorhexidine  Gluconate Cloth  6 each Topical Q0600   cinacalcet   120 mg Oral Q breakfast   clopidogrel   75 mg Oral Daily   collagenase    Topical Daily   [START ON 03/09/2024] darbepoetin (ARANESP ) injection - DIALYSIS  100 mcg Subcutaneous Q Mon-1800  docusate sodium   100 mg Oral BID   feeding supplement (KATE FARMS STANDARD 1.4) Liquid  325 mL Oral Daily   ferrous sulfate   325 mg Oral Q breakfast   folic acid   1 mg Oral Daily   gabapentin   300 mg Oral QHS   heparin   5,000 Units Subcutaneous Q8H   hydrocerin   Topical BID   insulin  aspart  0-6 Units Subcutaneous TID WC   lidocaine   1 patch Transdermal Q24H   midodrine   10 mg Oral TID WC   multivitamin  1 tablet Oral QHS   nutrition supplement (JUVEN)  1 packet Oral BID BM   pantoprazole   40 mg Oral BID AC   psyllium  1 packet Oral Daily   selenium  sulfide  1 Application Topical Daily   sevelamer  carbonate  2,400 mg Oral TID WC    Dialysis Orders: MWF NW 4h  B400  87.3kg  2K bath  AVF   Heparin  3000 ESA: Mircera 50 q 2 wks (last 12/15) BMM: Calcitriol  2 mcg PO q HD, cinacalcet  120 mg daily    Assessment/Plan: ESRD -on HD MWF. Next HD 1/23 -Get routine labs with HD.     Volume/chronic hypotension:  -HD with midodrine  support, UF as tolerated   Anemia of ESRD:  -declines IV alb/ prbc's, hold off on IV Fe, increased ESA dose (hgb 8.2)   Hyperkalemia:  -persistent, mild, had been on lokelma --on hold for now, can resume if needed, k remains wnl   Septic arthritis R knee and right shoulder:  -SP I&D 1/09 per ortho, refused IV so getting po abx--per primary. S/p fluid collection of right shoulder 1/13, highly infectious. ID and ortho following, s/p right shoulder I&D with abx spacer placement -Discussed with pharmacy: plan to continue Vancomycin  IV 1GM and Cefepime  IV 2GM both with HD until 04/10/24.   Otitis media w/ TM perforation:  -s/p course of abx, ENT has seen   R sided weakness:  -main c/o on admission, CVA w/u was negative   Secondary hyperparathyroidism:  -phos at goal   Dispo -In CIR  Charmaine Piety, NP [facility] 03/05/2024,4:24 PM  LOS: 2 days    "

## 2024-03-05 NOTE — Progress Notes (Signed)
 "                                                        PROGRESS NOTE   Subjective/Complaints: Pt had good night on air mattress. Was unhappy that he had no meat with breakfast. Therapy went well yesterday. Pain was manageable  ROS: Patient denies fever, rash, sore throat, blurred vision, dizziness, nausea, vomiting, diarrhea, cough, shortness of breath or chest pain,   headache, or mood change.    Objective:   No results found. No results for input(s): WBC, HGB, HCT, PLT in the last 72 hours. Recent Labs    03/04/24 0534 03/05/24 0548  NA 130* 132*  K 4.8 4.2  CL 88* 92*  CO2 27 26  GLUCOSE 85 83  BUN 55* 32*  CREATININE 7.84* 5.50*  CALCIUM  9.9 9.3    Intake/Output Summary (Last 24 hours) at 03/05/2024 9081 Last data filed at 03/04/2024 1736 Gross per 24 hour  Intake 0 ml  Output 1500 ml  Net -1500 ml     Wound 03/03/24 0330 Pressure Injury Sacrum Left Unstageable - Full thickness tissue loss in which the base of the injury is covered by slough (yellow, tan, gray, green or brown) and/or eschar (tan, brown or black) in the wound bed. (Active)    Physical Exam: Vital Signs Blood pressure 100/68, pulse 85, temperature 98.9 F (37.2 C), temperature source Oral, resp. rate 17, height 6' 2 (1.88 m), weight 85.7 kg, SpO2 96%.  Constitutional: No distress . Vital signs reviewed. HEENT: NCAT, EOMI, oral membranes moist Neck: supple Cardiovascular: RRR without murmur. No JVD    Respiratory/Chest: CTA Bilaterally without wheezes or rales. Normal effort    GI/Abdomen: BS +, non-tender, non-distended Ext: no clubbing, cyanosis, or edema Psych: pleasant and cooperative  Skin: scalp with dry skin, denuded area where he's scratched. Healing wounds on right shoulder and knee. Sacral wound as below  Neuro:  Alert and oriented x 3. Normal insight and awareness. Intact Memory. Normal language and speech. Cranial nerve exam unremarkable. MMT: LUE 5/5/ RUE limited proximally  due to pain but 5/5 distally. RLE 2/5 HF,KE pain, 4+ ankle. LLE 4/5 prox to 5/5 distal. Decreased LT in feet.   Musculoskeletal: right knee with swelling,  tender. Right shoulder  tender.     Assessment/Plan: 1. Functional deficits which require 3+ hours per day of interdisciplinary therapy in a comprehensive inpatient rehab setting. Physiatrist is providing close team supervision and 24 hour management of active medical problems listed below. Physiatrist and rehab team continue to assess barriers to discharge/monitor patient progress toward functional and medical goals  Care Tool:  Bathing              Bathing assist Assist Level: Minimal Assistance - Patient > 75%     Upper Body Dressing/Undressing Upper body dressing        Upper body assist Assist Level: Minimal Assistance - Patient > 75%    Lower Body Dressing/Undressing Lower body dressing            Lower body assist Assist for lower body dressing: Total Assistance - Patient < 25%     Toileting Toileting    Toileting assist Assist for toileting: Total Assistance - Patient < 25%     Transfers Chair/bed transfer  Transfers assist  Chair/bed transfer assist level: Dependent - Patient 0% (steady)     Locomotion Ambulation   Ambulation assist   Ambulation activity did not occur: Safety/medical concerns          Walk 10 feet activity   Assist  Walk 10 feet activity did not occur: Safety/medical concerns        Walk 50 feet activity   Assist Walk 50 feet with 2 turns activity did not occur: Safety/medical concerns         Walk 150 feet activity   Assist Walk 150 feet activity did not occur: Safety/medical concerns         Walk 10 feet on uneven surface  activity   Assist Walk 10 feet on uneven surfaces activity did not occur: Safety/medical concerns         Wheelchair     Assist Is the patient using a wheelchair?: Yes Type of Wheelchair: Manual     Wheelchair assist level: Supervision/Verbal cueing Max wheelchair distance: 10 feet    Wheelchair 50 feet with 2 turns activity    Assist        Assist Level: Total Assistance - Patient < 25%   Wheelchair 150 feet activity     Assist      Assist Level: Total Assistance - Patient < 25%   Blood pressure 100/68, pulse 85, temperature 98.9 F (37.2 C), temperature source Oral, resp. rate 17, height 6' 2 (1.88 m), weight 85.7 kg, SpO2 96%.  Medical Problem List and Plan: 1. Functional deficits secondary to septic right knee and shoulder             -patient may shower if incisons are covered             -ELOS/Goals: 15-20 days, min assist goals with PT and OT, W/C level?  --Continue CIR therapies including PT, OT   2.  Antithrombotics:  -DVT/anticoagulation:  Pharmaceutical: Heparin              -antiplatelet therapy: ASA/Plavix  3. Pain Management: oxycodone  prn.             -pt will take oxycodone  prior to therapies based on his schedule. I won't schedule it  for now.  4. Mood/Behavior/Sleep:  LCSW to follow for evaluation and support.              -antipsychotic agents: N/A             --Melatonin prn for insomnia 5. Neuropsych/cognition: This patient is capable of making decisions on his own behalf. 6. Skin/Wound Care: surgical incisions CDI, healing  -air mattress for sacral wound, WOC. Santyl  to area of necrotic tissue  -have discussed importance of nutrition with pt  -medicated shampoo for scalp was helpful 7. Fluids/Electrolytes/Nutrition: Strict I/O. Renal diet w/1200 cc/FR.   -labs reviewed 8.  Septic arthritis R-knee/R-shoulder: To continue Cefepime  and Vanc with HD thru 04/10/24             --shoulder to be aspirated in 8 weeks per Dr. [name]? 9.  Chronic hypotension: On midodrine  10  mg TID with prn doses  10. ESRD: HD MWF continue at the end of the day to help with tolerance of therapy.              --chronic hyponatremia managed with HD/FR  -have  requested a piece of bacon or sausage on breakfast plate 11. T2DM: A1C- 5.1. Will continue to monitor BS ac/hs and if controlled decrease to twice a day  12. Anemia of chronic disease: Jehovah's witness and refuses blood transfusion 13. Otitis media with TM perforation: Has completed antibiotic course.  --Follow up with [name] for evaluation.  14. Cervical myelopathy: Gabapentin  for neuropathic pain 15. PAD s/p R-SFA/popliteal lithotripsy/Stent: On ASA, Plavix  and Lipitor.       LOS: 2 days A FACE TO FACE EVALUATION WAS PERFORMED  [name] 03/05/2024, 9:18 AM     "

## 2024-03-05 NOTE — Progress Notes (Signed)
 Physical Therapy Session Note  Patient Details  Name: [name]. MRN: [id number] Date of Birth: 1960/04/07  Today's Date: 03/05/2024 PT Individual Time: 0800-0915 PT Individual Time Calculation (min): 75 min   Short Term Goals: Week 1:  PT Short Term Goal 1 (Week 1): pt will perform sit to stand with RW and max A PT Short Term Goal 2 (Week 1): pt will perform bed to chair transfer with LRAD and max A PT Short Term Goal 3 (Week 1): Pt will tolerate standing upright for ~ 2 min  Skilled Therapeutic Interventions/Progress Updates:    pt received in bed and agreeable to therapy. Pt reports unrated pain in R knee>shoulder this am, discussed non-pharmaceutical pain management like cryotherapy, as well as using chair positioning and ice for edema management.   Supervision bed mobility with increased time. Pt stood to stedy with max a, use of +2 d/t therapist limitations. Pt navigated PWC with supervision. Session focused on standing and LE strength. Pt set up in //bars for x 5 Sit to stand practice with mod-max a. Pt with difficulty with knee extension and limited by RLE strength. Pt perseverative on issues with his family and concern with why this happened throughout, required max redirection to task at times, as well as encouragement to focus on next steps not the past.   Therapist provided adjustments to chair for comfort while using. Discussed need to get into manual ASAP.   Provided hamstring stretch and discussed strategies for edema management, pt expressed understanding, was left with all needs in reach and alarm active.   Therapy Documentation Precautions:  Precautions Precautions: Fall Precaution/Restrictions Comments: watch HR, R knee buckles, spinal Restrictions Weight Bearing Restrictions Per Provider Order: Yes RUE Weight Bearing Per Provider Order: Weight bearing as tolerated RLE Weight Bearing Per Provider Order: Weight bearing as tolerated Other Position/Activity  Restrictions: Per Dr. [name] on 1/19 pt can perform ROM and WB through RUE as tolerated General:        Therapy/Group: Individual Therapy  [name] 03/05/2024, 9:49 AM

## 2024-03-05 NOTE — Plan of Care (Signed)
" °  Problem: RH Ambulation Goal: LTG Patient will ambulate in home environment (PT) Description: LTG: Patient will ambulate in home environment, # of feet with assistance (PT). Flowsheets (Taken 03/05/2024 1116) LTG: Pt will ambulate in home environ  assist needed:: Contact Guard/Touching assist   "

## 2024-03-06 LAB — GLUCOSE, CAPILLARY
Glucose-Capillary: 82 mg/dL (ref 70–99)
Glucose-Capillary: 110 mg/dL — ABNORMAL HIGH (ref 70–99)
Glucose-Capillary: 111 mg/dL — ABNORMAL HIGH (ref 70–99)
Glucose-Capillary: 109 mg/dL — ABNORMAL HIGH (ref 70–99)

## 2024-03-06 MED ORDER — VANCOMYCIN HCL IN DEXTROSE 1-5 GM/200ML-% IV SOLN
1000.0000 mg | INTRAVENOUS | Status: AC
  Administered 2024-03-07: 1000 mg via INTRAVENOUS
  Filled 2024-03-06: qty 200

## 2024-03-06 MED ORDER — SODIUM CHLORIDE 0.9 % IV SOLN
2.0000 g | INTRAVENOUS | Status: AC
  Administered 2024-03-07: 2 g via INTRAVENOUS
  Filled 2024-03-06: qty 12.5

## 2024-03-06 NOTE — Progress Notes (Signed)
 " [facility] Progress Note   Subjective:  Seen during PT session. Doing ok this AM. No CP/dyspnea. For HD today.   Objective Vitals:   03/05/24 1422 03/05/24 2045 03/06/24 0409 03/06/24 0500  BP: 102/73 (!) 143/79 115/67   Pulse: 90 87 93   Resp: 20  17   Temp: 97.6 F (36.4 C) 97.7 F (36.5 C) 99 F (37.2 C)   TempSrc: Oral     SpO2: 97% 100% 99%   Weight:    85.9 kg  Height:       Physical Exam General: Well appearing, NAD. Room air Heart: RRR Lungs: CTAB  Abdomen: soft Extremities: no LE edema, R knee tenderness Dialysis Access: AVF +t/b  Additional Objective Labs: Basic Metabolic Panel: Recent Labs  Lab 03/03/24 0149 03/04/24 0534 03/05/24 0548  NA 132* 130* 132*  K 4.5 4.8 4.2  CL 90* 88* 92*  CO2 29 27 26   GLUCOSE 92 85 83  BUN 38* 55* 32*  CREATININE 5.87* 7.84* 5.50*  CALCIUM  9.4 9.9 9.3  PHOS 3.0 3.8 2.9   Liver Function Tests: Recent Labs  Lab 03/03/24 0149 03/04/24 0534 03/05/24 0548  ALBUMIN  2.9* 2.9* 2.8*   CBC: Recent Labs  Lab 02/28/24 1501  HGB 8.2*  HCT 24.0*   Medications:  ceFEPime  (MAXIPIME ) IV Stopped (03/04/24 1856)   vancomycin  Stopped (03/04/24 1857)    (feeding supplement) PROSource Plus  30 mL Oral BID BM   aspirin  EC  81 mg Oral Daily   atorvastatin   10 mg Oral Daily   carbamide peroxide  10 drop Right EAR QPC supper   Chlorhexidine  Gluconate Cloth  6 each Topical Q0600   cinacalcet   120 mg Oral Q breakfast   clopidogrel   75 mg Oral Daily   collagenase    Topical Daily   [START ON 03/09/2024] darbepoetin (ARANESP ) injection - DIALYSIS  100 mcg Subcutaneous Q Mon-1800   docusate sodium   100 mg Oral BID   feeding supplement (KATE FARMS STANDARD 1.4) Liquid  325 mL Oral Daily   ferrous sulfate   325 mg Oral Q breakfast   folic acid   1 mg Oral Daily   gabapentin   300 mg Oral QHS   heparin   5,000 Units Subcutaneous Q8H   hydrocerin   Topical BID   insulin  aspart  0-6 Units Subcutaneous TID WC    lidocaine   1 patch Transdermal Q24H   midodrine   10 mg Oral TID WC   multivitamin  1 tablet Oral QHS   nutrition supplement (JUVEN)  1 packet Oral BID BM   pantoprazole   40 mg Oral BID AC   psyllium  1 packet Oral Daily   selenium  sulfide  1 Application Topical Daily   sevelamer  carbonate  2,400 mg Oral TID WC    Dialysis Orders MWF NW 4h  B400  87.3kg  2K bath  AVF   Heparin  3000 ESA: Mircera 50 q 2 wks (last 12/15) BMM: Calcitriol  2 mcg PO q HD, cinacalcet  120 mg daily    Assessment/Plan:  ESRD - on HD MWF - For HD today 1/23    Volume/chronic hypotension:  - HD with midodrine  support, UF as tolerated   Anemia of ESRD:  - Declines IV alb/ prbc's, hold off on IV Fe, increased ESA dose (hgb 8.2)   Hyperkalemia:  - resolved   Septic arthritis R knee and R shoulder:  - S/p R knee I&D/washout 1/9 - S/p R shoulder I&D/washout 1/16  - Cultures negative, but per  ID will  continue Vancomycin  IV 1GM and Cefepime  IV 2GM with HD until 04/10/24.   Otitis media w/ TM perforation:  - s/p course of abx, ENT has seen   R sided weakness:  - main c/o on admission, CVA w/u was negative   Secondary hyperparathyroidism:  - CorrCa slightly high, VDRA on hold, continue sensipar  - Phos to goal, continue sevelamer    Dispo -In CIR   [name], PA-C 03/06/2024, 10:02 AM  [facility]    "

## 2024-03-06 NOTE — IPOC Note (Signed)
 Overall Plan of Care [facility]) Patient Details Name: Carl Marcin Sr. MRN: [id number] DOB: 04-16-1960  Admitting Diagnosis: Debility, septic joints  Hospital Problems: Principal Problem:   Debility     Functional Problem List: Nursing Bowel, Endurance, Medication Management, Pain, Safety  PT Balance, Endurance, Motor, Pain, Sensory, Skin Integrity  OT Balance, Edema, Endurance, Motor, Pain, Safety, Skin Integrity, Sensory  SLP    TR         Basic ADLs: OT Grooming, Bathing, Dressing, Toileting     Advanced  ADLs: OT None     Transfers: PT Bed Mobility, Bed to Chair, Car, Occupational Psychologist, Research Scientist (life Sciences): PT Ambulation     Additional Impairments: OT Fuctional Use of Upper Extremity  SLP        TR      Anticipated Outcomes Item Anticipated Outcome  Self Feeding mod I  Swallowing      Basic self-care  SBA  Toileting  SBA   Bathroom Transfers SBA  Bowel/Bladder  manage bowel w mod I  Transfers  supervision  Locomotion  CGA  Communication     Cognition     Pain  Pain < 4 with prns  Safety/Judgment  manage safety w cues   Therapy Plan: PT Intensity: Minimum of 1-2 x/day ,45 to 90 minutes PT Frequency: 5 out of 7 days PT Duration Estimated Length of Stay: 2-3 weeks OT Intensity: Minimum of 1-2 x/day, 45 to 90 minutes OT Frequency: 5 out of 7 days OT Duration/Estimated Length of Stay: 16-21 days     Team Interventions: Nursing Interventions Bowel Management, Disease Management/Prevention, Pain Management, Medication Management, Discharge Planning, Patient/Family Education  PT interventions Ambulation/gait training, Discharge planning, Functional mobility training, Psychosocial support, Therapeutic Activities, Visual/perceptual remediation/compensation, Balance/vestibular training, Disease management/prevention, Neuromuscular re-education, Skin care/wound management, Therapeutic Exercise, Wheelchair propulsion/positioning, Cognitive  remediation/compensation, DME/adaptive equipment instruction, Pain management, Splinting/orthotics, UE/LE Strength taining/ROM, Firefighter, Equities Trader education, Museum/gallery curator, UE/LE Coordination activities  OT Interventions Warden/ranger, Fish Farm Manager, Equities Trader education, Therapeutic Activities, Wheelchair propulsion/positioning, Functional electrical stimulation, Psychosocial support, Therapeutic Exercise, Community reintegration, Functional mobility training, Self Care/advanced ADL retraining, UE/LE Strength taining/ROM, Discharge planning, Neuromuscular re-education, Skin care/wound managment, UE/LE Coordination activities, Splinting/orthotics, Disease mangement/prevention, Pain management  SLP Interventions    TR Interventions    SW/CM Interventions Discharge Planning, Psychosocial Support, Patient/Family Education   Barriers to Discharge MD  Medical stability  Nursing Hemodialysis, Decreased caregiver support 1 st floor apt w son;Mod I using RW; x3 falls in past month due to R knee buckling  ADLs Comments: Mod I ADLs, was driving but difficulty driving lately due to R leg not functioning properly; HD M-W -F  PT Hemodialysis, Lack of/limited family support, Wound Care pain, dizziness,  OT None    SLP      SW Decreased caregiver support, Lack of/limited family support, Community Education Officer for SNF coverage     Team Discharge Planning: Destination: PT-Home ,OT- Home , SLP-  Projected Follow-up: PT-Home health PT, Outpatient PT, OT-  Outpatient OT, SLP-  Projected Equipment Needs: PT-Other (comment), OT- To be determined, SLP-  Equipment Details: PT-pt has all DME needs from previous CIR stay, OT-  Patient/family involved in discharge planning: PT- Patient,  OT-Patient, SLP-   MD ELOS: 17-21 days Medical Rehab Prognosis:  Excellent Assessment: The patient has been admitted for CIR therapies with the diagnosis of debility and septic right  shoulder and knee. The team will be addressing functional mobility, strength, stamina, balance, safety,  adaptive techniques and equipment, self-care, bowel and bladder mgt, patient and caregiver education, pain mgt, ortho precautions. Goals have been set at supervision to set up with self-care and supervision to contact guard with mobility. Anticipated discharge destination is home with family.        See Team Conference Notes for weekly updates to the plan of care

## 2024-03-06 NOTE — Plan of Care (Signed)
" °  Problem: Consults Goal: RH GENERAL PATIENT EDUCATION Description: See Patient Education module for education specifics. Outcome: Progressing Goal: Nutrition Consult-if indicated Outcome: Progressing Goal: Diabetes Guidelines if Diabetic/Glucose > 140 Description: If diabetic or lab glucose is > 140 mg/dl - Initiate Diabetes/Hyperglycemia Guidelines & Document Interventions  Outcome: Progressing   Problem: RH BOWEL ELIMINATION Goal: RH STG MANAGE BOWEL WITH ASSISTANCE Description: STG Manage Bowel with mod I Assistance. Outcome: Progressing Goal: RH STG MANAGE BOWEL W/MEDICATION W/ASSISTANCE Description: STG Manage Bowel with Medication with mod I Assistance. Outcome: Progressing   Problem: RH SAFETY Goal: RH STG ADHERE TO SAFETY PRECAUTIONS W/ASSISTANCE/DEVICE Description: STG Adhere to Safety Precautions With cues Assistance/Device. Outcome: Progressing   Problem: RH PAIN MANAGEMENT Goal: RH STG PAIN MANAGED AT OR BELOW PT'S PAIN GOAL Description: Pain < 4 with prns Outcome: Progressing   Problem: RH KNOWLEDGE DEFICIT GENERAL Goal: RH STG INCREASE KNOWLEDGE OF SELF CARE AFTER HOSPITALIZATION Description: Patient and son will be able to manage care at discharge using educational resources independently Outcome: Progressing   "

## 2024-03-06 NOTE — Progress Notes (Signed)
 Physical Therapy Session Note  Patient Details  Name: Carl Busche Sr. MRN: [id number] Date of Birth: 03/07/1960  Today's Date: 03/06/2024 PT Individual Time: 0905-1000 PT Individual Time Calculation (min): 55 min   Short Term Goals: Week 1:  PT Short Term Goal 1 (Week 1): pt will perform sit to stand with RW and max A PT Short Term Goal 2 (Week 1): pt will perform bed to chair transfer with LRAD and max A PT Short Term Goal 3 (Week 1): Pt will tolerate standing upright for ~ 2 min  Skilled Therapeutic Interventions/Progress Updates: Pt presented in PWC agreeable to therapy. Pt c/o pain in R knee and shoulder. PTA performed hamstring stretch and gentle ROM to RLE for pain management. Also discussed activities that can be done in bed including quad and glute sets. Pt navigated to day room with supervision and set up at [employer]. Participated in 2 min at 70cm/sec then an additional x 5 min 60cm/sec for ROM and ms recruitment. Pt then navigated to standing frame and participated in standing tolerance and weight shifting acceptance. Pt able to tolerate stand x 10 min with pt performing varying activities including AA TKE on RLE, weight shifting, and with facilitation at hips and chest achieving nearly full erect posture (pt was unable to maintain full TKE on RLE without assist). Pt then returned to room at end of session and remained in [facility] with call bell within reach and needs met.       Therapy Documentation Precautions:  Precautions Precautions: Fall Precaution/Restrictions Comments: watch HR, R knee buckles, spinal Restrictions Weight Bearing Restrictions Per Provider Order: Yes RUE Weight Bearing Per Provider Order: Weight bearing as tolerated RLE Weight Bearing Per Provider Order: Weight bearing as tolerated Other Position/Activity Restrictions: Per Dr. [name] on 1/19 pt can perform ROM and WB through RUE as tolerated  Therapy/Group: Individual Therapy  Salsabeel Gorelick 03/06/2024, 4:29 PM

## 2024-03-06 NOTE — Progress Notes (Signed)
 Occupational Therapy Session Note  Patient Details  Name: Carl Ayars Sr. MRN: [id number] Date of Birth: Dec 22, 1960  Today's Date: 03/06/2024 OT Individual Time: 0700-0810 OT Individual Time Calculation (min): 70 min    Short Term Goals: Week 1:  OT Short Term Goal 1 (Week 1): Pt will increase ROM of Rt shoulder by 10* OT Short Term Goal 2 (Week 1): pt will complete sit to stand at Mod A using LRAD OT Short Term Goal 3 (Week 1): Pt will stand with Mod A for 1 min< with LRAD in prep for ADLs  Skilled Therapeutic Interventions/Progress Updates:    Pt resting in bed upon arrival. Skilled OT intervention with focus on bed mobility, sit<>stand, standing balance, UB dressing, BUE AROM/therex for improved joint mobility, and activity tolerance to increase pt's independence with BADLs. Supine>sit EOB with supervision. Pt donned shirt seated EOB with supervision. Sit<>stand in [place] with max A. Pt able to reposition in w/c without assistance. Grooming with supervision. PWC mobility with supervision. UBE 10 mins x 1 and 5 mins x 1 level 4 for joint mobility and conditioning. Ongoing educaiton and discharge planning. Pt returned to room and remained in PWC. All needs within reach. Pt independent with directing care.   Therapy Documentation Precautions:  Precautions Precautions: Fall Precaution/Restrictions Comments: watch HR, R knee buckles, spinal Restrictions Weight Bearing Restrictions Per Provider Order: Yes RUE Weight Bearing Per Provider Order: Weight bearing as tolerated RLE Weight Bearing Per Provider Order: Weight bearing as tolerated Other Position/Activity Restrictions: Per Dr. [name] on 1/19 pt can perform ROM and WB through RUE as tolerated   Pain: Pt c/o RLE knee discomfort (unrated); meds admin prior to therapy   Therapy/Group: Individual Therapy  [name] 03/06/2024, 8:11 AM

## 2024-03-06 NOTE — Progress Notes (Signed)
 Occupational Therapy Session Note  Patient Details  Name: Carl Corkery Sr. MRN: [id number] Date of Birth: Mar 21, 1960  Today's Date: 03/06/2024 OT Individual Time: 1100-1200 OT Individual Time Calculation (min): 60 min    Short Term Goals: Week 1:  OT Short Term Goal 1 (Week 1): Pt will increase ROM of Rt shoulder by 10* OT Short Term Goal 2 (Week 1): pt will complete sit to stand at Mod A using LRAD OT Short Term Goal 3 (Week 1): Pt will stand with Mod A for 1 min< with LRAD in prep for ADLs  Skilled Therapeutic Interventions/Progress Updates:    Skilled OT intervention with focus on sit<>stand in [place], standing balance in [place], RUE AAROM/AROM, and activity tolerance to increase independence with BADLs. Sit<>stand in [place] with max A from elevated surface. Pt requested to use toilet. Transfer with [facility]. Dependent for toileting tasks. PWC mobility with supervision. Pt directs care independently. RUE AROM/AAROM with focus on shoulder flexion and elbow flexion/extension. Pt incorprates compensatory techniques with shoulder flexion (shoulder hiking.) Pt with improved AROM with repetition. Pt returned to room and returned to bed awaiting dialysis. Dependent transfer with Stedy. Sit>supine with mod A for RLE mgmt. Pt remained in bed with all needs within reach.   Therapy Documentation Precautions:  Precautions Precautions: Fall Precaution/Restrictions Comments: watch HR, R knee buckles, spinal Restrictions Weight Bearing Restrictions Per Provider Order: Yes RUE Weight Bearing Per Provider Order: Weight bearing as tolerated RLE Weight Bearing Per Provider Order: Weight bearing as tolerated Other Position/Activity Restrictions: Per Dr. [name] on 1/19 pt can perform ROM and WB through RUE as tolerated   Pain:  Pt c/o Rt knee pain (unrated) with standing in [place]; repositioned  Therapy/Group: Individual Therapy  Maritza Debby Mare 03/06/2024, 12:12 PM

## 2024-03-07 DIAGNOSIS — I959 Hypotension, unspecified: Secondary | ICD-10-CM

## 2024-03-07 DIAGNOSIS — R739 Hyperglycemia, unspecified: Secondary | ICD-10-CM

## 2024-03-07 LAB — RENAL FUNCTION PANEL
Albumin: 3.1 g/dL — ABNORMAL LOW (ref 3.5–5.0)
Anion gap: 16 — ABNORMAL HIGH (ref 5–15)
BUN: 87 mg/dL — ABNORMAL HIGH (ref 8–23)
CO2: 24 mmol/L (ref 22–32)
Calcium: 9.1 mg/dL (ref 8.9–10.3)
Chloride: 88 mmol/L — ABNORMAL LOW (ref 98–111)
Creatinine, Ser: 8.89 mg/dL — ABNORMAL HIGH (ref 0.61–1.24)
GFR, Estimated: 6 mL/min — ABNORMAL LOW
Glucose, Bld: 112 mg/dL — ABNORMAL HIGH (ref 70–99)
Phosphorus: 3.3 mg/dL (ref 2.5–4.6)
Potassium: 5.1 mmol/L (ref 3.5–5.1)
Sodium: 129 mmol/L — ABNORMAL LOW (ref 135–145)

## 2024-03-07 LAB — CBC
HCT: 20.5 % — ABNORMAL LOW (ref 39.0–52.0)
Hemoglobin: 6.6 g/dL — CL (ref 13.0–17.0)
MCH: 28.8 pg (ref 26.0–34.0)
MCHC: 32.2 g/dL (ref 30.0–36.0)
MCV: 89.5 fL (ref 80.0–100.0)
Platelets: 422 10*3/uL — ABNORMAL HIGH (ref 150–400)
RBC: 2.29 MIL/uL — ABNORMAL LOW (ref 4.22–5.81)
RDW: 15.3 % (ref 11.5–15.5)
WBC: 9.5 10*3/uL (ref 4.0–10.5)
nRBC: 0 % (ref 0.0–0.2)

## 2024-03-07 LAB — GLUCOSE, CAPILLARY
Glucose-Capillary: 79 mg/dL (ref 70–99)
Glucose-Capillary: 103 mg/dL — ABNORMAL HIGH (ref 70–99)
Glucose-Capillary: 98 mg/dL (ref 70–99)

## 2024-03-07 MED ORDER — MELATONIN 5 MG PO TABS
5.0000 mg | ORAL_TABLET | Freq: Every evening | ORAL | Status: AC | PRN
  Administered 2024-03-13 – 2024-03-20 (×4): 5 mg via ORAL
  Filled 2024-03-07 (×4): qty 1

## 2024-03-07 MED ORDER — VANCOMYCIN HCL IN DEXTROSE 1-5 GM/200ML-% IV SOLN
INTRAVENOUS | Status: AC
  Filled 2024-03-07: qty 200

## 2024-03-07 NOTE — Plan of Care (Signed)
" °  Problem: Consults Goal: RH GENERAL PATIENT EDUCATION Description: See Patient Education module for education specifics. Outcome: Progressing Goal: Nutrition Consult-if indicated Outcome: Progressing Goal: Diabetes Guidelines if Diabetic/Glucose > 140 Description: If diabetic or lab glucose is > 140 mg/dl - Initiate Diabetes/Hyperglycemia Guidelines & Document Interventions  Outcome: Progressing   Problem: RH BOWEL ELIMINATION Goal: RH STG MANAGE BOWEL WITH ASSISTANCE Description: STG Manage Bowel with mod I Assistance. Outcome: Progressing Goal: RH STG MANAGE BOWEL W/MEDICATION W/ASSISTANCE Description: STG Manage Bowel with Medication with mod I Assistance. Outcome: Progressing   Problem: RH SAFETY Goal: RH STG ADHERE TO SAFETY PRECAUTIONS W/ASSISTANCE/DEVICE Description: STG Adhere to Safety Precautions With cues Assistance/Device. Outcome: Progressing   Problem: RH PAIN MANAGEMENT Goal: RH STG PAIN MANAGED AT OR BELOW PT'S PAIN GOAL Description: Pain < 4 with prns Outcome: Progressing   Problem: RH KNOWLEDGE DEFICIT GENERAL Goal: RH STG INCREASE KNOWLEDGE OF SELF CARE AFTER HOSPITALIZATION Description: Patient and son will be able to manage care at discharge using educational resources independently Outcome: Progressing   "

## 2024-03-07 NOTE — Progress Notes (Signed)
 Occupational Therapy Session Note  Patient Details  Name: [name]. MRN: [id number] Date of Birth: [birth year]  Today's Date: 03/07/2024 OT Individual Time: 9199-9084 OT Individual Time Calculation (min): 75 min    Short Term Goals: Week 1:  OT Short Term Goal 1 (Week 1): Pt will increase ROM of Rt shoulder by 10* OT Short Term Goal 2 (Week 1): pt will complete sit to stand at Mod A using LRAD OT Short Term Goal 3 (Week 1): Pt will stand with Mod A for 1 min< with LRAD in prep for ADLs  Skilled Therapeutic Interventions/Progress Updates:     Initial Encounter: Patient seated EOB at the time of arrival finishing up breakfast. The pt reported not resting well during the night secondary with challenges with his bed. The pt reported a pain response of a 8 on 0-10 for right knee pain. The pt went on to say that nursing provided medication to address his pain. The pt was in agreement with completing a bathing exercise EOB.    BADL Related Task:  The pt was seated EOB at the time of arrival   The pt was able to doff his over head shirt with closeS. The pt was able to bathe UB with s/uA.  The pt was able to bathe LB with MinA for his more distal LE. The pt was able to apply deo and lotion with s/uA. The pt was able to towel wash his head with MaxA. The pt was able to donn his over head shirt with s/uA, he was ModA for donning his LB clothing a underwear and pants with ModA using the stedy x1 for coming into stand for donning the items over his hips and bottom. The pt was able to transfer from EOB to w/c LOF using the stedy at ModAx1. The pt was able to go to the sink for brushing his teeth with s/uA. At the end of the session, the pt remained at w/c LOF with his foot rest in place and his call light and bedside table within reach. All additional needs were addressed prior to exiting the room.    Therapy Documentation Precautions:  Precautions Precautions: Fall Precaution/Restrictions  Comments: watch HR, R knee buckles, spinal Restrictions Weight Bearing Restrictions Per Provider Order: Yes RUE Weight Bearing Per Provider Order: Weight bearing as tolerated RLE Weight Bearing Per Provider Order: Weight bearing as tolerated Other Position/Activity Restrictions: Per Dr. [name] on 1/19 pt can perform ROM and WB through RUE as tolerated   Therapy/Group: Individual Therapy  Elvera JONETTA Mace 03/07/2024, 4:59 PM

## 2024-03-07 NOTE — Progress Notes (Signed)
 Physical Therapy Session Note  Patient Details  Name: Carl Coba Sr. MRN: [id number] Date of Birth: 10/30/1960  Today's Date: 03/07/2024 PT Individual Time: 0915-1000 PT Individual Time Calculation (min): 45 min   Short Term Goals: Week 1:  PT Short Term Goal 1 (Week 1): pt will perform sit to stand with RW and max A PT Short Term Goal 2 (Week 1): pt will perform bed to chair transfer with LRAD and max A PT Short Term Goal 3 (Week 1): Pt will tolerate standing upright for ~ 2 min  Skilled Therapeutic Interventions/Progress Updates:    Pt recd in PWC, recd pain medication for knee pain, unrated. While nsg provided meds pass, therapist provided active assist small range flexion extension followed by passive R knee ext stretching to improve TKE. Therapist retrieved and adjusted lateral support for PWC. Pt then participated in block practice of Sit to stand with stedy. Requires max to stand from chair and mod from stedy flaps. Cues for full trunk extension and momentum with power up. Pt remained in PWC with needs in reach.   Therapy Documentation Precautions:  Precautions Precautions: Fall Precaution/Restrictions Comments: watch HR, R knee buckles, spinal Restrictions Weight Bearing Restrictions Per Provider Order: Yes RUE Weight Bearing Per Provider Order: Weight bearing as tolerated RLE Weight Bearing Per Provider Order: Weight bearing as tolerated Other Position/Activity Restrictions: Per Dr. [name] on 1/19 pt can perform ROM and WB through RUE as tolerated General: PT Amount of Missed Time (min): 14 Minutes PT Missed Treatment Reason: Patient fatigue     Therapy/Group: Individual Therapy  [name] 03/07/2024, 1:01 PM

## 2024-03-07 NOTE — Progress Notes (Signed)
 " [facility] Progress Note   Subjective:   Seen in room - dialysis rolled to today due to high census. No CP/dyspnea.  Objective Vitals:   03/06/24 0500 03/06/24 1438 03/06/24 2026 03/07/24 0352  BP:  111/70 119/70 106/66  Pulse:  81 83 83  Resp:  17 16 16   Temp:  97.6 F (36.4 C) 98.6 F (37 C) 97.8 F (36.6 C)  TempSrc:  Oral Oral Oral  SpO2:  100% 93% 100%  Weight: 85.9 kg     Height:       Physical Exam General: Well appearing, NAD. Room air Heart: RRR Lungs: CTAB  Abdomen: soft Extremities: no LE edema, stitched incisions to R shoulder Dialysis Access: AVF +t/b    Additional Objective Labs: Basic Metabolic Panel: Recent Labs  Lab 03/03/24 0149 03/04/24 0534 03/05/24 0548  NA 132* 130* 132*  K 4.5 4.8 4.2  CL 90* 88* 92*  CO2 29 27 26   GLUCOSE 92 85 83  BUN 38* 55* 32*  CREATININE 5.87* 7.84* 5.50*  CALCIUM  9.4 9.9 9.3  PHOS 3.0 3.8 2.9   Liver Function Tests: Recent Labs  Lab 03/03/24 0149 03/04/24 0534 03/05/24 0548  ALBUMIN  2.9* 2.9* 2.8*   Blood Culture    Component Value Date/Time   SDES TISSUE RIGHT SHOULDER 02/28/2024 1636   SPECREQUEST SWAB C INTRA ARTICULAR SUTURE 02/28/2024 1636   CULT  02/28/2024 1636    No growth aerobically or anaerobically. Performed at [facility], [address] [place], [name]    REPTSTATUS 03/04/2024 FINAL 02/28/2024 1636   Medications:  ceFEPime  (MAXIPIME ) IV Stopped (03/04/24 1856)   ceFEPime  (MAXIPIME ) IV     vancomycin  Stopped (03/04/24 1857)   vancomycin       (feeding supplement) PROSource Plus  30 mL Oral BID BM   aspirin  EC  81 mg Oral Daily   atorvastatin   10 mg Oral Daily   carbamide peroxide  10 drop Right EAR QPC supper   Chlorhexidine  Gluconate Cloth  6 each Topical Q0600   cinacalcet   120 mg Oral Q breakfast   clopidogrel   75 mg Oral Daily   collagenase    Topical Daily   [START ON 03/09/2024] darbepoetin (ARANESP ) injection - DIALYSIS  100 mcg  Subcutaneous Q Mon-1800   docusate sodium   100 mg Oral BID   feeding supplement (KATE FARMS STANDARD 1.4) Liquid  325 mL Oral Daily   ferrous sulfate   325 mg Oral Q breakfast   folic acid   1 mg Oral Daily   gabapentin   300 mg Oral QHS   heparin   5,000 Units Subcutaneous Q8H   hydrocerin   Topical BID   insulin  aspart  0-6 Units Subcutaneous TID WC   lidocaine   1 patch Transdermal Q24H   midodrine   10 mg Oral TID WC   multivitamin  1 tablet Oral QHS   nutrition supplement (JUVEN)  1 packet Oral BID BM   pantoprazole   40 mg Oral BID AC   psyllium  1 packet Oral Daily   selenium  sulfide  1 Application Topical Daily   sevelamer  carbonate  2,400 mg Oral TID WC    Dialysis Orders MWF NW 4h  B400  87.3kg  2K bath  AVF   Heparin  3000 ESA: Mircera 50 q 2 wks (last 12/15) BMM: Calcitriol  2 mcg PO q HD, cinacalcet  120 mg daily    Assessment/Plan:   ESRD - on HD MWF - For HD today 1/24 (rollover from yesterday)  Volume/chronic hypotension:  - HD with midodrine  support, UF as tolerated   Anemia of ESRD:  - Declines IV alb/ prbc's, hold off on IV Fe, increased ESA dose (hgb 8.2)   Hyperkalemia:  - resolved   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   Otitis media w/ TM perforation:  - s/p course of abx, ENT has seen   R sided weakness:  - main c/o on admission, CVA w/u was negative   Secondary hyperparathyroidism:  - CorrCa slightly high, VDRA on hold, continue sensipar  - Phos to goal, continue sevelamer   Nutrition - Continue renal diet and protein supplements   Dispo -In CIR       [name], PA-C 03/07/2024, 8:41 AM  [facility]    "

## 2024-03-07 NOTE — Progress Notes (Signed)
 "                                                        PROGRESS NOTE   Subjective/Complaints:  Pt doing well this morning, slept ok-ish (air mattress malfunctioned but they got it working again). Pain doing ok, manageable. LBM yesterday. Doesn't make urine, didn't get dialysis yesterday d/t census but will get dialyzed today.  Asking about when his shoulder sutures should be removed.  No other complaints or concerns.    ROS: as per HPI. Denies CP, SOB, abd pain, N/V/D/C, or any other complaints at this time.     Objective:   No results found. No results for input(s): WBC, HGB, HCT, PLT in the last 72 hours. Recent Labs    03/05/24 0548  NA 132*  K 4.2  CL 92*  CO2 26  GLUCOSE 83  BUN 32*  CREATININE 5.50*  CALCIUM  9.3    Intake/Output Summary (Last 24 hours) at 03/07/2024 1114 Last data filed at 03/07/2024 0700 Gross per 24 hour  Intake 424 ml  Output --  Net 424 ml     Wound 03/03/24 0330 Pressure Injury Sacrum Left Unstageable - Full thickness tissue loss in which the base of the injury is covered by slough (yellow, tan, gray, green or brown) and/or eschar (tan, brown or black) in the wound bed. (Active)    Physical Exam: Vital Signs Blood pressure 106/66, pulse 83, temperature 97.8 F (36.6 C), temperature source Oral, resp. rate 16, height 6' 2 (1.88 m), weight 85.9 kg, SpO2 100%.  Constitutional: No distress . Vital signs reviewed. Sitting up at EOB washing up.  HEENT: NCAT, EOMI, oral membranes moist Neck: supple Cardiovascular: RRR without murmur. No JVD    Respiratory/Chest: CTA Bilaterally without wheezes or rales. Normal effort    GI/Abdomen: BS +, non-tender, non-distended, soft Ext: no clubbing or cyanosis, trace edema BLE Psych: pleasant and cooperative, good spirits Skin: scalp with dry skin, denuded area where he's scratched. Healing surgical wounds on right shoulder and knee c/d/I, sutures intact at R shoulder wounds. Sacral wound as  below-- not reassessed  Neuro: A&O x3  PRIOR EXAMS: Neuro:  Alert and oriented x 3. Normal insight and awareness. Intact Memory. Normal language and speech. Cranial nerve exam unremarkable. MMT: LUE 5/5/ RUE limited proximally due to pain but 5/5 distally. RLE 2/5 HF,KE pain, 4+ ankle. LLE 4/5 prox to 5/5 distal. Decreased LT in feet.   Musculoskeletal: right knee with swelling,  tender. Right shoulder  tender.     Assessment/Plan: 1. Functional deficits which require 3+ hours per day of interdisciplinary therapy in a comprehensive inpatient rehab setting. Physiatrist is providing close team supervision and 24 hour management of active medical problems listed below. Physiatrist and rehab team continue to assess barriers to discharge/monitor patient progress toward functional and medical goals  Care Tool:  Bathing              Bathing assist Assist Level: Minimal Assistance - Patient > 75%     Upper Body Dressing/Undressing Upper body dressing        Upper body assist Assist Level: Minimal Assistance - Patient > 75%    Lower Body Dressing/Undressing Lower body dressing            Lower body assist Assist for  lower body dressing: Total Assistance - Patient < 25%     Toileting Toileting    Toileting assist Assist for toileting: 2 Helpers     Transfers Chair/bed transfer  Transfers assist     Chair/bed transfer assist level: Dependent - Patient 0% (steady)     Locomotion Ambulation   Ambulation assist   Ambulation activity did not occur: Safety/medical concerns          Walk 10 feet activity   Assist  Walk 10 feet activity did not occur: Safety/medical concerns        Walk 50 feet activity   Assist Walk 50 feet with 2 turns activity did not occur: Safety/medical concerns         Walk 150 feet activity   Assist Walk 150 feet activity did not occur: Safety/medical concerns         Walk 10 feet on uneven surface   activity   Assist Walk 10 feet on uneven surfaces activity did not occur: Safety/medical concerns         Wheelchair     Assist Is the patient using a wheelchair?: Yes Type of Wheelchair: Manual    Wheelchair assist level: Supervision/Verbal cueing Max wheelchair distance: 10 feet    Wheelchair 50 feet with 2 turns activity    Assist        Assist Level: Total Assistance - Patient < 25%   Wheelchair 150 feet activity     Assist      Assist Level: Total Assistance - Patient < 25%   Blood pressure 106/66, pulse 83, temperature 97.8 F (36.6 C), temperature source Oral, resp. rate 16, height 6' 2 (1.88 m), weight 85.9 kg, SpO2 100%.  Medical Problem List and Plan: 1. Functional deficits secondary to septic right knee and shoulder             -patient may shower if incisons are covered             -ELOS/Goals: 15-20 days, min assist goals with PT and OT, W/C level?  --Continue CIR therapies including PT, OT    2.  Antithrombotics:  -DVT/anticoagulation:  Pharmaceutical: Heparin  5kU q8h             -antiplatelet therapy: ASA/Plavix  daily  3. Pain Management: tylenol  prn, oxycodone  prn. Lidoderm  patches.  -pt will take oxycodone  prior to therapies based on his schedule. I won't schedule it  for now.   4. Mood/Behavior/Sleep:  LCSW to follow for evaluation and support.              -antipsychotic agents: N/A  -Melatonin PRN  5. Neuropsych/cognition: This patient is capable of making decisions on his own behalf.  6. Skin/Wound Care: surgical incisions CDI, healing  -air mattress for sacral wound, WOC. Santyl  to area of necrotic tissue  -have discussed importance of nutrition with pt  -medicated shampoo for scalp was helpful -03/07/24 pt inquiring about sutures R shoulder-- looks like surgery was 1/16, weekday team to touch base with ortho regarding timing of removal but likely this coming week.   7. Fluids/Electrolytes/Nutrition: Strict I/O. Renal  diet w/1200 cc/FR. Continue vitamins/supplements. Continue Protonix  40mg  BID  -labs reviewed, stable  8.  Septic arthritis R-knee/R-shoulder: To continue Cefepime  and Vanc with HD thru 04/10/24             --shoulder to be aspirated in 8 weeks per Dr. [name]?  9.  Chronic hypotension: On midodrine  10mg  TID with prn doses. Stable  Vitals:  03/04/24 1732 03/04/24 1736 03/04/24 1848 03/04/24 2114  BP: 128/80 124/74 117/69 106/72   03/05/24 0609 03/05/24 1153 03/05/24 1422 03/05/24 2045  BP: 100/68 99/60 102/73 (!) 143/79   03/06/24 0409 03/06/24 1438 03/06/24 2026 03/07/24 0352  BP: 115/67 111/70 119/70 106/66     10. ESRD: HD MWF continue at the end of the day to help with tolerance of therapy. Continue Cinacalcet  and Sevelamer              --chronic hyponatremia managed with HD/FR  -have requested a piece of bacon or sausage on breakfast plate  -8/75/73 dialysis delayed to today (Sat) d/t census  11. T2DM: A1C- 5.1. Will continue to monitor BS ac/hs and if controlled decrease to twice a day -03/07/24 CBGs very well controlled, monitor thru weekend, could decrease to BID  CBG (last 3)  Recent Labs    03/06/24 1646 03/06/24 2044 03/07/24 0538  GLUCAP 111* 109* 79     12. Anemia of chronic disease: Jehovah's witness and refuses blood transfusion. Hgb ~8, continue Aranesp  and iron supplement and folic acid   13. L suppurative Otitis media with TM perforation: Has completed antibiotic course.  --Follow up with [name] for evaluation. -Getting debrox for R ear cerumen impaction, DO NOT USE IN LEFT EAR  14. Cervical myelopathy: Gabapentin  300mg  nightly for neuropathic pain  15. PAD s/p R-SFA/popliteal lithotripsy/Stent: On ASA, Plavix  and Lipitor 10mg  daily    16. Constipation: continue Colace 100mg  BID, psyllium packet daily  -03/07/24 LBM yesterday   LOS: 4 days A FACE TO FACE EVALUATION WAS PERFORMED  [address] 03/07/2024, 11:14 AM     "

## 2024-03-07 NOTE — Progress Notes (Addendum)
 Was informed by lab that patient has a critical Hemoglobin of 6.5  Informed Dr. Cornelio, Dr. Alayne and [facility] LPN for patient on 4W via EPIC messenger.  Patient reminded me though that he is refusing blood.  Camellia Brasil, LPN-[facility]

## 2024-03-07 NOTE — Progress Notes (Signed)
 Physical Therapy Session Note  Patient Details  Name: Carl Ace Sr. MRN: [id number] Date of Birth: 19-Jul-1960  Today's Date: 03/07/2024 PT Individual Time: 1116-1202 PT Individual Time Calculation (min): 46 min   Short Term Goals: Week 1:  PT Short Term Goal 1 (Week 1): pt will perform sit to stand with RW and max A PT Short Term Goal 2 (Week 1): pt will perform bed to chair transfer with LRAD and max A PT Short Term Goal 3 (Week 1): Pt will tolerate standing upright for ~ 2 min  Skilled Therapeutic Interventions/Progress Updates: Patient sitting in PWC on entrance to room. Patient alert and agreeable to PT session.   Patient reported no pain, only fatigue. Pt navigated PWC throughout rehab floor with supervision and safely maneuvering around corners and door ways. Pt in // bars and performed x 5 sit<>Stand with less than one minute each time standing tolerance and required seated rest breaks. PTA performed with pillow between pt and PTA knees and pt required max/totalA to stand for 3/5 sit<>stand, and minA to scoot anteriorly with help from chuck pad moving forward. Pt cued throughout to increase anterior lean and to power through B LE's. Pt required cues throughout each stand to increase hip and knee extension (R LE maintained knee in flexion). Pt reported fatigue and to return to room before dialysis. Pt transition from PWC<EOB via STEDY maxA and from sit<supine with minA to advance R LE onto bed.  Patient supine in bed at end of session with brakes locked, nsg present, and all needs within reach.      Therapy Documentation Precautions:  Precautions Precautions: Fall Precaution/Restrictions Comments: watch HR, R knee buckles, spinal Restrictions Weight Bearing Restrictions Per Provider Order: Yes RUE Weight Bearing Per Provider Order: Weight bearing as tolerated RLE Weight Bearing Per Provider Order: Weight bearing as tolerated Other Position/Activity Restrictions: Per Dr. [name]  on 1/19 pt can perform ROM and WB through RUE as tolerated  Therapy/Group: Individual Therapy  [name] PTA 03/07/2024, 12:34 PM

## 2024-03-07 NOTE — Progress Notes (Signed)
 Received patient in bed to unit.  Alert and oriented.  Informed consent signed and in chart.   TX duration:3.5 hours.  Patient cartridge had bubbles and had to change cartridge out, and could not rinse back  Patient tolerated well.  Transported back to the room  Alert, without acute distress.  Hand-off given to patient's nurse.   Access used: Right forearm Fistula Access issues: BFR to 300 due to high venous pressures  Total UF removed: Medication(s) given: Vancomycin  and Maxipeme   03/07/24 2030  Vitals  Temp 98.3 F (36.8 C)  Temp Source Oral  BP 132/77  MAP (mmHg) 94  Pulse Rate 88  ECG Heart Rate 89  Resp (!) 23  Oxygen Therapy  SpO2 100 %  O2 Device Room Air  During Treatment Monitoring  Duration of HD Treatment -hour(s) 3.5 hour(s)  HD Safety Checks Performed Yes  Intra-Hemodialysis Comments Tx completed  Post Treatment  Dialyzer Clearance Clear  Liters Processed 63  Fluid Removed (mL) 300 mL  Tolerated HD Treatment No (Comment)  Post-Hemodialysis Comments Bubbles in first cartridge did not rinse back  AVG/AVF Arterial Site Held (minutes) 7 minutes  AVG/AVF Venous Site Held (minutes) 7 minutes  Fistula / Graft Right Forearm  No placement date or time found.   Orientation: Right  Access Location: Forearm  Site Condition No complications  Fistula / Graft Assessment Present;Thrill;Bruit  Status Patent;Deaccessed  Drainage Description None     Camellia Brasil LPN [facility]

## 2024-03-08 DIAGNOSIS — I10 Essential (primary) hypertension: Secondary | ICD-10-CM

## 2024-03-08 DIAGNOSIS — D631 Anemia in chronic kidney disease: Secondary | ICD-10-CM

## 2024-03-08 DIAGNOSIS — K5900 Constipation, unspecified: Secondary | ICD-10-CM

## 2024-03-08 LAB — GLUCOSE, CAPILLARY
Glucose-Capillary: 119 mg/dL — ABNORMAL HIGH (ref 70–99)
Glucose-Capillary: 90 mg/dL (ref 70–99)
Glucose-Capillary: 100 mg/dL — ABNORMAL HIGH (ref 70–99)
Glucose-Capillary: 97 mg/dL (ref 70–99)

## 2024-03-08 LAB — OCCULT BLOOD X 1 CARD TO LAB, STOOL: Fecal Occult Bld: NEGATIVE

## 2024-03-08 MED ORDER — HYDROCORTISONE 1 % EX CREA
1.0000 | TOPICAL_CREAM | Freq: Three times a day (TID) | CUTANEOUS | Status: AC | PRN
  Administered 2024-03-08 – 2024-03-17 (×5): 1 via TOPICAL
  Filled 2024-03-08: qty 28

## 2024-03-08 MED ORDER — DARBEPOETIN ALFA 200 MCG/0.4ML IJ SOSY: 200.0000 ug | PREFILLED_SYRINGE | INTRAMUSCULAR | Status: DC

## 2024-03-08 MED ORDER — EPOETIN ALFA 10000 UNIT/ML IJ SOLN
20000.0000 [IU] | INTRAMUSCULAR | Status: AC
  Administered 2024-03-09 – 2024-03-18 (×5): 20000 [IU] via SUBCUTANEOUS
  Filled 2024-03-08 (×7): qty 2

## 2024-03-08 NOTE — Progress Notes (Signed)
 "                                                        PROGRESS NOTE   Subjective/Complaints: Reports he had problems with his mattress but has been in place.Hemoglobin noted to be 6.6 yesterday.  Pain under control overall.  No additional concerns or complaints.  ROS: as per HPI. Denies fevers, chills, CP, SOB, abd pain, N/V/D/C, or any other complaints at this time.     Objective:   No results found. Recent Labs    03/07/24 1618  WBC 9.5  HGB 6.6*  HCT 20.5*  PLT 422*   Recent Labs    03/07/24 1618  NA 129*  K 5.1  CL 88*  CO2 24  GLUCOSE 112*  BUN 87*  CREATININE 8.89*  CALCIUM  9.1    Intake/Output Summary (Last 24 hours) at 03/08/2024 1601 Last data filed at 03/08/2024 0400 Gross per 24 hour  Intake 500 ml  Output 300 ml  Net 200 ml     Wound 03/03/24 0330 Pressure Injury Sacrum Left Unstageable - Full thickness tissue loss in which the base of the injury is covered by slough (yellow, tan, gray, green or brown) and/or eschar (tan, brown or black) in the wound bed. (Active)    Physical Exam: Vital Signs Blood pressure 114/66, pulse 90, temperature 98.5 F (36.9 C), temperature source Oral, resp. rate 15, height 6' 2 (1.88 m), weight 91.5 kg, SpO2 100%.  Constitutional: No distress . Vital signs reviewed.  Laying in bed appears comfortable HEENT: NCAT, EOMI, oral membranes moist Neck: supple Cardiovascular: RRR without murmur. No JVD    Respiratory/Chest: CTA Bilaterally without wheezes or rales. Normal effort   GI/Abdomen: BS +, non-tender, non-distended, soft Ext: no clubbing or cyanosis, trace edema BLE Psych: pleasant and cooperative, good spirits Skin: scalp with dry skin, denuded area where he's scratched. Healing surgical wounds on right shoulder and knee c/d/I, sutures intact at R shoulder wounds. Sacral wound as below-- not reassessed  Neuro: A&O x3, follows commands, normal speech and migraines, cranial nerves II to XII grossly intact  PRIOR  EXAMS: Neuro:  Alert and oriented x 3. Normal insight and awareness. Intact Memory. Normal language and speech. Cranial nerve exam unremarkable. MMT: LUE 5/5/ RUE limited proximally due to pain but 5/5 distally. RLE 2/5 HF,KE pain, 4+ ankle. LLE 4/5 prox to 5/5 distal. Decreased LT in feet.   Musculoskeletal: right knee with swelling,  tender. Right shoulder  tender.     Assessment/Plan: 1. Functional deficits which require 3+ hours per day of interdisciplinary therapy in a comprehensive inpatient rehab setting. Physiatrist is providing close team supervision and 24 hour management of active medical problems listed below. Physiatrist and rehab team continue to assess barriers to discharge/monitor patient progress toward functional and medical goals  Care Tool:  Bathing              Bathing assist Assist Level: Minimal Assistance - Patient > 75%     Upper Body Dressing/Undressing Upper body dressing        Upper body assist Assist Level: Minimal Assistance - Patient > 75%    Lower Body Dressing/Undressing Lower body dressing            Lower body assist Assist for lower body dressing: Total Assistance -  Patient < 25%     Editor, Commissioning assist Assist for toileting: 2 Helpers     Transfers Chair/bed transfer  Transfers assist     Chair/bed transfer assist level: Dependent - Patient 0% (steady)     Locomotion Ambulation   Ambulation assist   Ambulation activity did not occur: Safety/medical concerns          Walk 10 feet activity   Assist  Walk 10 feet activity did not occur: Safety/medical concerns        Walk 50 feet activity   Assist Walk 50 feet with 2 turns activity did not occur: Safety/medical concerns         Walk 150 feet activity   Assist Walk 150 feet activity did not occur: Safety/medical concerns         Walk 10 feet on uneven surface  activity   Assist Walk 10 feet on uneven surfaces activity  did not occur: Safety/medical concerns         Wheelchair     Assist Is the patient using a wheelchair?: Yes Type of Wheelchair: Manual    Wheelchair assist level: Supervision/Verbal cueing Max wheelchair distance: 10 feet    Wheelchair 50 feet with 2 turns activity    Assist        Assist Level: Total Assistance - Patient < 25%   Wheelchair 150 feet activity     Assist      Assist Level: Total Assistance - Patient < 25%   Blood pressure 114/66, pulse 90, temperature 98.5 F (36.9 C), temperature source Oral, resp. rate 15, height 6' 2 (1.88 m), weight 91.5 kg, SpO2 100%.  Medical Problem List and Plan: 1. Functional deficits secondary to septic right knee and shoulder             -patient may shower if incisons are covered             -ELOS/Goals: 15-20 days, min assist goals with PT and OT, W/C level?  --Continue CIR therapies including PT, OT    2.  Antithrombotics:  -DVT/anticoagulation:  Pharmaceutical: Heparin  5kU q8h             -antiplatelet therapy: ASA/Plavix  daily  3. Pain Management: tylenol  prn, oxycodone  prn. Lidoderm  patches.  -pt will take oxycodone  prior to therapies based on his schedule. I won't schedule it  for now.   4. Mood/Behavior/Sleep:  LCSW to follow for evaluation and support.              -antipsychotic agents: N/A  -Melatonin PRN  5. Neuropsych/cognition: This patient is capable of making decisions on his own behalf.  6. Skin/Wound Care: surgical incisions CDI, healing  -air mattress for sacral wound, WOC. Santyl  to area of necrotic tissue  -have discussed importance of nutrition with pt  -medicated shampoo for scalp was helpful -03/07/24 pt inquiring about sutures R shoulder-- looks like surgery was 1/16, weekday team to touch base with ortho regarding timing of removal but likely this coming week.   7. Fluids/Electrolytes/Nutrition: Strict I/O. Renal diet w/1200 cc/FR. Continue vitamins/supplements. Continue Protonix   40mg  BID  -labs reviewed, stable  8.  Septic arthritis R-knee/R-shoulder: To continue Cefepime  and Vanc with HD thru 04/10/24             --shoulder to be aspirated in 8 weeks per Dr. [name]?  9.  Chronic hypotension: On midodrine  10mg  TID with prn doses. Stable   -1/25 BP stable overall continue current  regimen and monitor Vitals:   03/07/24 1815 03/07/24 1830 03/07/24 1845 03/07/24 1900  BP: 124/73 (!) 145/75 130/75 125/85   03/07/24 1930 03/07/24 1945 03/07/24 2000 03/07/24 2030  BP: 138/76 127/78 135/74 132/77   03/07/24 2031 03/07/24 2133 03/08/24 0347 03/08/24 1359  BP: 134/78 136/85 110/62 114/66     10. ESRD: HD MWF continue at the end of the day to help with tolerance of therapy. Continue Cinacalcet  and Sevelamer              --chronic hyponatremia managed with HD/FR  -have requested a piece of bacon or sausage on breakfast plate  -8/75/73 dialysis delayed to today (Sat) d/t census  -1/25 reviewed nephrology note, next HD tomorrow.  11. T2DM: A1C- 5.1. Will continue to monitor BS ac/hs and if controlled decrease to twice a day -03/08/23 CBGs very well controlled, monitor thru weekend, could decrease to BID  -1/25 CBGs controlled continue to monitor CBG (last 3)  Recent Labs    03/07/24 2128 03/08/24 0531 03/08/24 1129  GLUCAP 98 119* 90     12. Anemia of chronic disease: Jehovah's witness and refuses blood transfusion. Hgb ~8, continue Aranesp  and iron supplement and folic acid   -1/25 hemoglobin yesterday down to 6.6, he denies any bleeding.  Will check occult stool test.  Nephrology planning for high-dose EPO.  Nephrology ordered iron studies today and considering IV iron.  Patient declined blood transfusion, reports he understands risks.  13. L suppurative Otitis media with TM perforation: Has completed antibiotic course.  --Follow up with [name] for evaluation. -Getting debrox for R ear cerumen impaction, DO NOT USE IN LEFT EAR  14. Cervical myelopathy:  Gabapentin  300mg  nightly for neuropathic pain  15. PAD s/p R-SFA/popliteal lithotripsy/Stent: On ASA, Plavix  and Lipitor 10mg  daily    16. Constipation: continue Colace 100mg  BID, psyllium packet daily  -1/25 LBM today   LOS: 5 days A FACE TO FACE EVALUATION WAS PERFORMED  Murray Collier 03/08/2024, 4:01 PM     "

## 2024-03-08 NOTE — Progress Notes (Signed)
 " [facility] Progress Note   Subjective:   Seen in room - some back and shoulder pain. No CP/dyspnea. Dialyzed yesterday, min UF. Next HD tomorrow.  Hgb down this AM - refuses blood products, denies bleeding. Will give max Aranesp  today.  Objective Vitals:   03/07/24 2030 03/07/24 2031 03/07/24 2133 03/08/24 0347  BP: 132/77 134/78 136/85 110/62  Pulse: 88 90 98 100  Resp: (!) 23 15 15 19   Temp: 98.3 F (36.8 C)  98 F (36.7 C) 98.8 F (37.1 C)  TempSrc: Oral  Oral Oral  SpO2: 100% 100% 100% 100%  Weight:    91.5 kg  Height:       Physical Exam General: Well appearing, NAD. Room air Heart: RRR Lungs: CTAB  Abdomen: soft Extremities: no LE edema, stitched incisions to R shoulder Dialysis Access: AVF +t/b  Additional Objective Labs: Basic Metabolic Panel: Recent Labs  Lab 03/04/24 0534 03/05/24 0548 03/07/24 1618  NA 130* 132* 129*  K 4.8 4.2 5.1  CL 88* 92* 88*  CO2 27 26 24   GLUCOSE 85 83 112*  BUN 55* 32* 87*  CREATININE 7.84* 5.50* 8.89*  CALCIUM  9.9 9.3 9.1  PHOS 3.8 2.9 3.3   Liver Function Tests: Recent Labs  Lab 03/04/24 0534 03/05/24 0548 03/07/24 1618  ALBUMIN  2.9* 2.8* 3.1*   CBC: Recent Labs  Lab 03/07/24 1618  WBC 9.5  HGB 6.6*  HCT 20.5*  MCV 89.5  PLT 422*   Medications:  ceFEPime  (MAXIPIME ) IV Stopped (03/04/24 1856)   vancomycin  Stopped (03/04/24 1857)    (feeding supplement) PROSource Plus  30 mL Oral BID BM   aspirin  EC  81 mg Oral Daily   atorvastatin   10 mg Oral Daily   carbamide peroxide  10 drop Right EAR QPC supper   Chlorhexidine  Gluconate Cloth  6 each Topical Q0600   cinacalcet   120 mg Oral Q breakfast   clopidogrel   75 mg Oral Daily   collagenase    Topical Daily   [START ON 03/09/2024] darbepoetin (ARANESP ) injection - DIALYSIS  100 mcg Subcutaneous Q Mon-1800   docusate sodium   100 mg Oral BID   feeding supplement (KATE FARMS STANDARD 1.4) Liquid  325 mL Oral Daily   ferrous sulfate   325 mg Oral Q  breakfast   folic acid   1 mg Oral Daily   gabapentin   300 mg Oral QHS   heparin   5,000 Units Subcutaneous Q8H   hydrocerin   Topical BID   insulin  aspart  0-6 Units Subcutaneous TID WC   lidocaine   1 patch Transdermal Q24H   midodrine   10 mg Oral TID WC   multivitamin  1 tablet Oral QHS   nutrition supplement (JUVEN)  1 packet Oral BID BM   pantoprazole   40 mg Oral BID AC   psyllium  1 packet Oral Daily   selenium  sulfide  1 Application Topical Daily   sevelamer  carbonate  2,400 mg Oral TID WC    Dialysis Orders MWF NW 4h  B400  87.3kg  2K bath  AVF   Heparin  3000 ESA: Mircera 50 q 2 wks (last 12/15) BMM: Calcitriol  2 mcg PO q HD, cinacalcet  120 mg daily    Assessment/Plan:   ESRD - on HD MWF - Next HD tomorrow 1/26   Volume/chronic hypotension:  - HD with midodrine  support, UF as tolerated   Anemia of ESRD:  - Declines IV albumin /blood products - Aranesp  ^ to max today - Checking iron studies today  Hyperkalemia:  - resolved   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   Otitis media w/ TM perforation:  - s/p course of abx, ENT has seen   R sided weakness:  - main c/o on admission, CVA w/u was negative   Secondary hyperparathyroidism:  - CorrCa slightly high, VDRA on hold, continue sensipar  - Phos to goal, continue sevelamer    Nutrition - Continue renal diet and protein supplements   Dispo -In CIR     [name], PA-C 03/08/2024, 9:36 AM  [facility]    "

## 2024-03-09 LAB — CBC
HCT: 19.6 % — ABNORMAL LOW (ref 39.0–52.0)
Hemoglobin: 6.3 g/dL — CL (ref 13.0–17.0)
MCH: 28.5 pg (ref 26.0–34.0)
MCHC: 32.1 g/dL (ref 30.0–36.0)
MCV: 88.7 fL (ref 80.0–100.0)
Platelets: 403 10*3/uL — ABNORMAL HIGH (ref 150–400)
RBC: 2.21 MIL/uL — ABNORMAL LOW (ref 4.22–5.81)
RDW: 15.3 % (ref 11.5–15.5)
WBC: 9.3 10*3/uL (ref 4.0–10.5)
nRBC: 0 % (ref 0.0–0.2)

## 2024-03-09 LAB — RENAL FUNCTION PANEL
Albumin: 3.1 g/dL — ABNORMAL LOW (ref 3.5–5.0)
Anion gap: 16 — ABNORMAL HIGH (ref 5–15)
BUN: 72 mg/dL — ABNORMAL HIGH (ref 8–23)
CO2: 25 mmol/L (ref 22–32)
Calcium: 8.8 mg/dL — ABNORMAL LOW (ref 8.9–10.3)
Chloride: 89 mmol/L — ABNORMAL LOW (ref 98–111)
Creatinine, Ser: 7.3 mg/dL — ABNORMAL HIGH (ref 0.61–1.24)
GFR, Estimated: 8 mL/min — ABNORMAL LOW
Glucose, Bld: 103 mg/dL — ABNORMAL HIGH (ref 70–99)
Phosphorus: 3.3 mg/dL (ref 2.5–4.6)
Potassium: 5.1 mmol/L (ref 3.5–5.1)
Sodium: 130 mmol/L — ABNORMAL LOW (ref 135–145)

## 2024-03-09 LAB — GLUCOSE, CAPILLARY
Glucose-Capillary: 30 mg/dL — CL (ref 70–99)
Glucose-Capillary: 96 mg/dL (ref 70–99)

## 2024-03-09 MED ORDER — VANCOMYCIN HCL IN DEXTROSE 1-5 GM/200ML-% IV SOLN
INTRAVENOUS | Status: AC
  Filled 2024-03-09: qty 200

## 2024-03-09 MED ORDER — INSULIN ASPART 100 UNIT/ML IJ SOLN: 0.0000 [IU] | Freq: Two times a day (BID) | INTRAMUSCULAR | Status: AC

## 2024-03-09 MED ORDER — HEPARIN SODIUM (PORCINE) 1000 UNIT/ML DIALYSIS
3000.0000 [IU] | Freq: Once | INTRAMUSCULAR | Status: AC
  Administered 2024-03-09: 3000 [IU] via INTRAVENOUS_CENTRAL

## 2024-03-09 NOTE — Progress Notes (Signed)
 Occupational Therapy Session Note  Patient Details  Name: Carl Mcisaac Sr. MRN: [id number] Date of Birth: 1960-11-19  Today's Date: 03/09/2024 OT Individual Time: 9299-9187 OT Individual Time Calculation (min): 72 min    Short Term Goals: Week 1:  OT Short Term Goal 1 (Week 1): Pt will increase ROM of Rt shoulder by 10* OT Short Term Goal 2 (Week 1): pt will complete sit to stand at Mod A using LRAD OT Short Term Goal 3 (Week 1): Pt will stand with Mod A for 1 min< with LRAD in prep for ADLs  Skilled Therapeutic Interventions/Progress Updates:    Pt seated in PWC upon arrival. Pt reports problems with bed and slept in PWC during the night. Bed problem reported earlier and new bed scheduled to replace current bed. Pt eating breakfast. Skilled OT intervention with focus on sit<>stand in Stedy, standing balance, and BUE therex for strengthening and joint mobility/health to increase pt's independence with BADLs. Sit<>stand in Stedy x 5 with max A. Pt requires max A for upright posture once standing in [place]. UBE 15 mins level 5 RPM 42. Pt requested to use toilet. Toilet transfer with [employer]. Dependent for clothing mgmt once standing. Pt remained on BSC over toilet. STaff notified and pt instructed to use call bell when finished.  Therapy Documentation Precautions:  Precautions Precautions: Fall Precaution/Restrictions Comments: watch HR, R knee buckles, spinal Restrictions Weight Bearing Restrictions Per Provider Order: Yes RUE Weight Bearing Per Provider Order: Weight bearing as tolerated RLE Weight Bearing Per Provider Order: Weight bearing as tolerated Other Position/Activity Restrictions: Per Dr. [name] on 1/19 pt can perform ROM and WB through RUE as tolerated Pain: Pt c/o RLE/knee and RUE/shoulder pain (unrated); meds admin at end of session and rest as appropriate   Therapy/Group: Individual Therapy  Maritza Debby Mare 03/09/2024, 8:19 AM

## 2024-03-09 NOTE — Progress Notes (Addendum)
 " [facility] Progress Note   Subjective:   Reports swelling in his legs, is frustrated that we have not been getting more fluid off with HD. Denies SOB, CP, dizziness, nausea.   Objective Vitals:   03/08/24 1359 03/08/24 2145 03/09/24 0653 03/09/24 0654  BP: 114/66 130/86 124/74   Pulse: 90 81 80   Resp: 15 18 18    Temp: 98.5 F (36.9 C) 98.1 F (36.7 C) 98.3 F (36.8 C)   TempSrc: Oral     SpO2: 100% 100% 100%   Weight:    91.5 kg  Height:       Physical Exam General: Alert male in NAD Heart: RRR, no murmur Lungs: CTA bilaterally, respirations unlabored on RA Abdomen: Soft, non-distended, +BS Extremities: 1+ edema b/l lower extremities Dialysis Access:  RUE AVF + T/b  Additional Objective Labs: Basic Metabolic Panel: Recent Labs  Lab 03/04/24 0534 03/05/24 0548 03/07/24 1618  NA 130* 132* 129*  K 4.8 4.2 5.1  CL 88* 92* 88*  CO2 27 26 24   GLUCOSE 85 83 112*  BUN 55* 32* 87*  CREATININE 7.84* 5.50* 8.89*  CALCIUM  9.9 9.3 9.1  PHOS 3.8 2.9 3.3   Liver Function Tests: Recent Labs  Lab 03/04/24 0534 03/05/24 0548 03/07/24 1618  ALBUMIN  2.9* 2.8* 3.1*   No results for input(s): LIPASE, AMYLASE in the last 168 hours. CBC: Recent Labs  Lab 03/07/24 1618  WBC 9.5  HGB 6.6*  HCT 20.5*  MCV 89.5  PLT 422*   Blood Culture    Component Value Date/Time   SDES TISSUE RIGHT SHOULDER 02/28/2024 1636   SPECREQUEST SWAB C INTRA ARTICULAR SUTURE 02/28/2024 1636   CULT  02/28/2024 1636    No growth aerobically or anaerobically. Performed at [facility], [address], [place], [name]    REPTSTATUS 03/04/2024 FINAL 02/28/2024 1636    Cardiac Enzymes: No results for input(s): CKTOTAL, CKMB, CKMBINDEX, TROPONINI in the last 168 hours. CBG: Recent Labs  Lab 03/08/24 0531 03/08/24 1129 03/08/24 1656 03/08/24 2146 03/09/24 0651  GLUCAP 119* 90 100* 97 96   Iron Studies: No results for input(s): IRON,  TIBC, TRANSFERRIN, FERRITIN in the last 72 hours. @lablastinr3 @ Studies/Results: No results found. Medications:  ceFEPime  (MAXIPIME ) IV Stopped (03/04/24 1856)   vancomycin  Stopped (03/04/24 1857)    (feeding supplement) PROSource Plus  30 mL Oral BID BM   aspirin  EC  81 mg Oral Daily   atorvastatin   10 mg Oral Daily   carbamide peroxide  10 drop Right EAR QPC supper   Chlorhexidine  Gluconate Cloth  6 each Topical Q0600   cinacalcet   120 mg Oral Q breakfast   clopidogrel   75 mg Oral Daily   collagenase    Topical Daily   docusate sodium   100 mg Oral BID   epoetin  alfa  20,000 Units Subcutaneous Q M,W,F-HD   feeding supplement (KATE FARMS STANDARD 1.4) Liquid  325 mL Oral Daily   ferrous sulfate   325 mg Oral Q breakfast   folic acid   1 mg Oral Daily   gabapentin   300 mg Oral QHS   heparin   5,000 Units Subcutaneous Q8H   hydrocerin   Topical BID   insulin  aspart  0-6 Units Subcutaneous TID WC   lidocaine   1 patch Transdermal Q24H   midodrine   10 mg Oral TID WC   multivitamin  1 tablet Oral QHS   nutrition supplement (JUVEN)  1 packet Oral BID BM   pantoprazole   40 mg Oral BID AC   psyllium  1 packet Oral Daily   selenium  sulfide  1 Application Topical Daily   sevelamer  carbonate  2,400 mg Oral TID WC    Dialysis Orders:  MWF NW 4h  B400  87.3kg  2K bath  AVF   Heparin  3000 ESA: Mircera 50 q 2 wks (last 12/15) BMM: Calcitriol  2 mcg PO q HD, cinacalcet  120 mg daily   Assessment/Plan: ESRD - on HD MWF - Next HD today   Volume/chronic hypotension:  - HD with midodrine  support, UF as tolerated -cannot use albumin  due to refusal of blood products which limits our BP support options -encouraged patient to limit his fluid intake   Anemia of ESRD:  - Declines IV albumin /blood products - switched to high-dose EPO 20,000u three times per week w/ HD - can adjust more frequently as needed for this pt who is Jehovah Witness - Checking iron studies today   Hyperkalemia:   - resolved   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   Otitis media w/ TM perforation:  - s/p course of abx, ENT has seen   R sided weakness:  - main c/o on admission, CVA w/u was negative   Secondary hyperparathyroidism:  - CorrCa slightly high, VDRA on hold, continue sensipar  - Phos to goal, continue sevelamer    Nutrition - Continue renal diet and protein supplements   Dispo -In CIR    Lucie Collet, PA-C 03/09/2024, 10:36 AM  [facility] Pager: [phone]  Pt seen, examined and agree w assess/plan as above with additions as indicated.  Rob [facility] 03/09/2024, 12:59 PM    "

## 2024-03-09 NOTE — Progress Notes (Signed)
 Physical Therapy Session Note  Patient Details  Name: Xyler Terpening Sr. MRN: [id number] Date of Birth: 23-Jan-1961  Today's Date: 03/09/2024 PT Individual Time: 1002-1058 PT Individual Time Calculation (min): 56 min   Short Term Goals: Week 1:  PT Short Term Goal 1 (Week 1): pt will perform sit to stand with RW and max A PT Short Term Goal 2 (Week 1): pt will perform bed to chair transfer with LRAD and max A PT Short Term Goal 3 (Week 1): Pt will tolerate standing upright for ~ 2 min  Skilled Therapeutic Interventions/Progress Updates:    Pt presents in PWC, reporting he is bleeding from a scratched cut on his upper lip this morning. Discussed plan for session today - pt wanting to work on his arms this morning. Suggested to focus on slideboard transfers in preparation to attempt transfer to Nustep (pt only using Stedy right now) for future goal to work on this for BUE/BLE reciprocal movement pattern retraining. PWC mobilty on unit mod I on unit to and from therapies. Set up for transfers with S with PWC, assist for RLE management and footplate. Pt to place SB and CGA for scoot on SB to the R onto the mat table and similar manner back to the L with focus on NMR to address anterior weightshift, head hips relationship, and overall functional balance and functional strengthening of BUE/BLE during transfer. NMR for forward weightshifting on mat table with focus on intiating weightshift and activation in BUE and BLE for clearance (unable to clear but able to initiate movement). Progressed with 2kg weighted ball for modified crunches for core activation and trunk balance x 25 reps, trunk rotation x 10 reps each direction, and forward raises (limited due to shoulders) to improve overall mobility with transfers and prepare for sit > stands. Transferred back to w/c as described above with SB with close S to CGA overall with improved technique. Returned to room with all needs in reach.   Therapy  Documentation Precautions:  Precautions Precautions: Fall Precaution/Restrictions Comments: watch HR, R knee buckles, spinal Restrictions Weight Bearing Restrictions Per Provider Order: Yes RUE Weight Bearing Per Provider Order: Weight bearing as tolerated RLE Weight Bearing Per Provider Order: Weight bearing as tolerated Other Position/Activity Restrictions: Per Dr. [name] on 1/19 pt can perform ROM and WB through RUE as tolerated  Pain:  Premedicated per report - usual issues with R knee and shoulder.    Therapy/Group: Individual Therapy  Elnor Pizza Sherrell Pizza WENDI Elnor, PT, DPT, CBIS  03/09/2024, 12:13 PM

## 2024-03-09 NOTE — Progress Notes (Signed)
 Physical Therapy Note  Patient Details  Name: [name] Sr. MRN: [id number] Date of Birth: [birth year] Today's Date: 03/09/2024    Due to the [place]  state of emergency, patients may not receive 3.0 hours of therapy services.   Kyli Sorter 03/09/2024, 10:58 AM

## 2024-03-09 NOTE — Progress Notes (Signed)
 "                                                        PROGRESS NOTE   Subjective/Complaints:   Pt reports slept OK, except bed is way too hard- it's an air mattress that according to OT and pt, has too much air in it and OT cannot get the amount of air to decrease.    LBM yesterday but has killer gas and needs to go again.  Asked OT to put on Toilet- on there currently-  Scratching unconsciously at head- has cortisone cream and shampoo for head- has coin dollar sized spot on head that's missing hair and has associated redness.   Asking when to get sutures out of shoulder- is itching.     ROS:  Per HPI  Pt denies SOB, abd pain, CP, N/V/ (+) C/D, and vision changes   Objective:   No results found. Recent Labs    03/07/24 1618  WBC 9.5  HGB 6.6*  HCT 20.5*  PLT 422*   Recent Labs    03/07/24 1618  NA 129*  K 5.1  CL 88*  CO2 24  GLUCOSE 112*  BUN 87*  CREATININE 8.89*  CALCIUM  9.1    Intake/Output Summary (Last 24 hours) at 03/09/2024 1104 Last data filed at 03/09/2024 0730 Gross per 24 hour  Intake 240 ml  Output --  Net 240 ml     Wound 03/03/24 0330 Pressure Injury Sacrum Left Unstageable - Full thickness tissue loss in which the base of the injury is covered by slough (yellow, tan, gray, green or brown) and/or eschar (tan, brown or black) in the wound bed. (Active)    Physical Exam: Vital Signs Blood pressure 124/74, pulse 80, temperature 98.3 F (36.8 C), resp. rate 18, height 6' 2 (1.88 m), weight 91.5 kg, SpO2 100%.    General: awake, alert, appropriate, transferred to [facility] with OT and to toilet- sitting on toilet to have BM; NAD HENT: conjugate gaze; oropharynx dry CV: regular rate and rhythm; no JVD Pulmonary: CTA B/L; no W/R/R- good air movement GI: soft, NT, ND, (+)BS- slightly hyperactive Psychiatric: appropriate, interactive Neurological: Ox3  Skin: scalp with dry skin, denuded area where he's scratched. Slightly larger than  dollar sized coin- Healing surgical wounds on right shoulder and knee c/d/I, sutures intact at R shoulder wounds. Looks good Sacral wound as below-- not reassessed  Neuro: A&O x3, follows commands, normal speech and migraines, cranial nerves II to XII grossly intact  PRIOR EXAMS: Neuro:  Alert and oriented x 3. Normal insight and awareness. Intact Memory. Normal language and speech. Cranial nerve exam unremarkable. MMT: LUE 5/5/ RUE limited proximally due to pain but 5/5 distally. RLE 2/5 HF,KE pain, 4+ ankle. LLE 4/5 prox to 5/5 distal. Decreased LT in feet.   Musculoskeletal: right knee with swelling,  tender. Right shoulder  tender.     Assessment/Plan: 1. Functional deficits which require 3+ hours per day of interdisciplinary therapy in a comprehensive inpatient rehab setting. Physiatrist is providing close team supervision and 24 hour management of active medical problems listed below. Physiatrist and rehab team continue to assess barriers to discharge/monitor patient progress toward functional and medical goals  Care Tool:  Bathing  Bathing assist Assist Level: Set up assist     Upper Body Dressing/Undressing Upper body dressing        Upper body assist Assist Level: Set up assist    Lower Body Dressing/Undressing Lower body dressing            Lower body assist Assist for lower body dressing: Moderate Assistance - Patient 50 - 74%     Toileting Toileting    Toileting assist Assist for toileting: 2 Helpers     Transfers Chair/bed transfer  Transfers assist     Chair/bed transfer assist level: Dependent - Patient 0% (steady)     Locomotion Ambulation   Ambulation assist   Ambulation activity did not occur: Safety/medical concerns          Walk 10 feet activity   Assist  Walk 10 feet activity did not occur: Safety/medical concerns        Walk 50 feet activity   Assist Walk 50 feet with 2 turns activity did not occur:  Safety/medical concerns         Walk 150 feet activity   Assist Walk 150 feet activity did not occur: Safety/medical concerns         Walk 10 feet on uneven surface  activity   Assist Walk 10 feet on uneven surfaces activity did not occur: Safety/medical concerns         Wheelchair     Assist Is the patient using a wheelchair?: Yes Type of Wheelchair: Manual    Wheelchair assist level: Supervision/Verbal cueing Max wheelchair distance: 10 feet    Wheelchair 50 feet with 2 turns activity    Assist        Assist Level: Total Assistance - Patient < 25%   Wheelchair 150 feet activity     Assist      Assist Level: Total Assistance - Patient < 25%   Blood pressure 124/74, pulse 80, temperature 98.3 F (36.8 C), resp. rate 18, height 6' 2 (1.88 m), weight 91.5 kg, SpO2 100%.  Medical Problem List and Plan: 1. Functional deficits secondary to septic right knee and shoulder             -patient may shower if incisons are covered             -ELOS/Goals: 15-20 days, min assist goals with PT and OT, W/C level?  Con't CIR PT and OT  Air mattress changed today- has new bed outside room ready to be in room when goes to therapy.  2.  Antithrombotics:  -DVT/anticoagulation:  Pharmaceutical: Heparin  5kU q8h due to ESRD             -antiplatelet therapy: ASA/Plavix  daily  3. Pain Management: tylenol  prn, oxycodone  prn. Lidoderm  patches.  -pt will take oxycodone  prior to therapies based on his schedule. I won't schedule it  for now.   1/26- Pain meds prn working for him- con't regimen 4. Mood/Behavior/Sleep:  LCSW to follow for evaluation and support.              -antipsychotic agents: N/A  -Melatonin PRN  5. Neuropsych/cognition: This patient is capable of making decisions on his own behalf.  6. Skin/Wound Care: surgical incisions CDI, healing  -air mattress for sacral wound, WOC. Santyl  to area of necrotic tissue  -have discussed importance of  nutrition with pt  -medicated shampoo for scalp was helpful -03/07/24 pt inquiring about sutures R shoulder-- looks like surgery was 1/16, weekday team to touch base  with ortho regarding timing of removal but likely this coming week.   1/26- can likely remove sutures 1/30 7. Fluids/Electrolytes/Nutrition: Strict I/O. Renal diet w/1200 cc/FR. Continue vitamins/supplements. Continue Protonix  40mg  BID  -labs reviewed, stable  1/26- labs pending 8.  Septic arthritis R-knee/R-shoulder: To continue Cefepime  and Vanc with HD thru 04/10/24             --shoulder to be aspirated in 8 weeks per Dr. [name]?  9.  Chronic hypotension: On midodrine  10mg  TID with prn doses. Stable   -1/25 BP stable overall continue current regimen and monitor  1/26- BP slightly soft, but denies symptoms Vitals:   03/07/24 1845 03/07/24 1900 03/07/24 1930 03/07/24 1945  BP: 130/75 125/85 138/76 127/78   03/07/24 2000 03/07/24 2030 03/07/24 2031 03/07/24 2133  BP: 135/74 132/77 134/78 136/85   03/08/24 0347 03/08/24 1359 03/08/24 2145 03/09/24 0653  BP: 110/62 114/66 130/86 124/74     10. ESRD: HD MWF continue at the end of the day to help with tolerance of therapy. Continue Cinacalcet  and Sevelamer              --chronic hyponatremia managed with HD/FR  -have requested a piece of bacon or sausage on breakfast plate  -8/75/73 dialysis delayed to today (Sat) d/t census  -1/25 reviewed nephrology note, next HD tomorrow.  1/26- Na last 129- labs pending- HD today 11. T2DM: A1C- 5.1. Will continue to monitor BS ac/hs and if controlled decrease to twice a day -03/08/23 CBGs very well controlled, monitor thru weekend, could decrease to BID  -1/25 CBGs controlled continue to monitor  1/26- will reduce CBGs and SSI to BID-AC CBG (last 3)  Recent Labs    03/08/24 1656 03/08/24 2146 03/09/24 0651  GLUCAP 100* 97 96     12. Anemia of chronic disease: Jehovah's witness and refuses blood transfusion. Hgb ~8, continue Aranesp   and iron supplement and folic acid   -1/25 hemoglobin yesterday down to 6.6, he denies any bleeding.  Will check occult stool test.  Nephrology planning for high-dose EPO.  Nephrology ordered iron studies today and considering IV iron.  Patient declined blood transfusion, reports he understands risks.  1/26- labs pending- iron studies today as well  13. L suppurative Otitis media with TM perforation: Has completed antibiotic course.  --Follow up with [name] for evaluation. -Getting debrox for R ear cerumen impaction, DO NOT USE IN LEFT EAR  14. Cervical myelopathy: Gabapentin  300mg  nightly for neuropathic pain  15. PAD s/p R-SFA/popliteal lithotripsy/Stent: On ASA, Plavix  and Lipitor 10mg  daily    16. Constipation: continue Colace 100mg  BID, psyllium packet daily  -1/25 LBM today  1/26- trying to have BM today as well- feeling constipated   I spent a total of  57  minutes on total care today- >50% coordination of care- due to  D/w pt and Nursing as well as OT_ also reviewed notes last 6 days and H&P; labs, vitals and orders- made multiple changes and also went over Renal note-   LOS: 6 days A FACE TO FACE EVALUATION WAS PERFORMED  Allicia Culley 03/09/2024, 11:04 AM     "

## 2024-03-10 LAB — IRON AND TIBC
Iron: 27 ug/dL — ABNORMAL LOW (ref 45–182)
Saturation Ratios: 12 % — ABNORMAL LOW (ref 17.9–39.5)
TIBC: 221 ug/dL — ABNORMAL LOW (ref 250–450)
UIBC: 195 ug/dL

## 2024-03-10 LAB — GLUCOSE, CAPILLARY
Glucose-Capillary: 80 mg/dL (ref 70–99)
Glucose-Capillary: 112 mg/dL — ABNORMAL HIGH (ref 70–99)

## 2024-03-10 LAB — FERRITIN: Ferritin: 3173 ng/mL — ABNORMAL HIGH (ref 24–336)

## 2024-03-10 NOTE — Progress Notes (Addendum)
 Patient ID: Carl Reister Sr., male   DOB: 1960/10/29, 64 y.o.   MRN: [id number]  1222- SW spoke with pt son [name] to provide updates from team conference, and d/c date now 2/11. Moved d/c date to 2/12 as team did not realize this was a dialysis day.  Dicussed fam edu closer to discharge. Currently scheduled for 2/6 9am-11am.   SW updates dialysis SWs on change in discharge date.   *SW met with pt in room and discussed above. He is aware there will continue to be updates.   [name], MSW, LCSW Office: [phone] Cell: [phone] Fax: [phone]

## 2024-03-10 NOTE — Patient Care Conference (Signed)
 Inpatient RehabilitationTeam Conference and Plan of Care Update Date: 03/10/2024   Time: 1131 am  Patient was admitted after the interdisciplinary team meeting took place on 03/03/2024; as a result, the first team conference occurred on day 8 of this patient's stay.     Patient Name: Carl Huish Sr.      Medical Record Number: [id number]  Date of Birth: [birth year] Sex: Male         Room/Bed: 4M12C/4M12C-01 Payor Info: Payor: [employer]ICARE / Plan: DREMA DUAL COMPLETE / Product Type: *No Product type* /    Admit Date/Time:  03/03/2024  3:18 PM  Primary Diagnosis:  Debility  Hospital Problems: Principal Problem:   Debility    Expected Discharge Date: Expected Discharge Date: 03/26/24  Team Members Present: Physician leading conference: Dr. Duwaine Barrs Social Worker Present: [name], LCSW Nurse Present: Eulalio Falls, RN PT Present: Schuyler Batter, PT OT Present: Camie Hoe, OT     Current Status/Progress Goal Weekly Team Focus  Bowel/Bladder   Pt is continent of bowel/bladder   Pt will remain continent of bowel/bladder   Pt will be tolieted Q 2hrs to maintain continent status and prevent accidents    Swallow/Nutrition/ Hydration               ADL's   Max LB ADLs, SBA UB ADLs, Max A sit to stand within stedy or from very raised bed, CGA bathing   SBA overall   transfers, LB ADLs, ROM on affected limbs    Mobility   supervision bed mobiltiy, CGA to mod a stedy transfers and transfers from elevated bed   supervision transfers, CGA gait up to 75 ft  BLE strength, Knee ROM    Communication                Safety/Cognition/ Behavioral Observations               Pain   Pt c/o left knee pain   Pt pain will be less than 3 on the pain scale   Pt will be assessed q 2hrs to ensure pain is less than 3 on the pain scale. Topical analgesic patches will be applied per MD orders    Skin     Pressure Injury Sacrum Left  Unstageable -  Pt will have no further area of alteration to his skins integrity. Sacral wound will remain clean, dry and free of infection  Pt will turn and reposition q 2 hrs and sacral would will remain free of any s/s of infection      Discharge Planning:  Pt will d/c to home with his son who will provide support. SW will confirm there are no barriers to discharge.    Team Discussion: Patient was admitted post septic right knee and shoulder. Patient on hemodialysis. MD adjusting medications for pain, anemia.   Patient on target to meet rehab goals: yes, currently patient needs SBA with upper body care and needs max assistance with lower body care. Patient needs CGA to mod assistance using a stedy transfers. Overall goals at discharge are set for supervision assistance.   *See Care Plan and progress notes for long and short-term goals.   Revisions to Treatment Plan:  Power wheelchair Pressure relieving mattress/ bed Slide board  Teaching Needs: Safety, medications, transfers, toileting, wound care, etc.   Current Barriers to Discharge: Decreased caregiver support, Home enviroment access/layout, Wound care, and Hemodialysis  Possible Resolutions to Barriers: Family Education      Medical Summary  Current Status: ESRD R shoulder and R knee incisions; in new bed-LBM 1/26; joint infections; son to drive to HD;  Barriers to Discharge: Behavior/Mood;Complicated Wound;Hypotension;Medical stability;Self-care education;Infection/IV Antibiotics;Uncontrolled Pain;Symptomatic Anemia;Weight bearing restrictions;Renal Insufficiency/Failure;Electrolyte abnormality;Volume Overload  Barriers to Discharge Comments: limited by Severe anemia- Hb 6.3- but pt jehovah's witness- won't take blood; Possible Resolutions to [employer]: needs to do standing transfer for HD- - wil recheck Hb th- since won't take blood- d/c 2/11   Continued Need for Acute Rehabilitation Level of Care: The  patient requires daily medical management by a physician with specialized training in physical medicine and rehabilitation for the following reasons: Direction of a multidisciplinary physical rehabilitation program to maximize functional independence : Yes Medical management of patient stability for increased activity during participation in an intensive rehabilitation regime.: Yes Analysis of laboratory values and/or radiology reports with any subsequent need for medication adjustment and/or medical intervention. : Yes   I attest that I was present, lead the team conference, and concur with the assessment and plan of the team.   [name] 03/10/2024, 1131 am

## 2024-03-10 NOTE — Progress Notes (Signed)
 Occupational Therapy Session Note  Patient Details  Name: Carl Pech Sr. MRN: [id number] Date of Birth: 05-08-1960  Today's Date: 03/10/2024 OT Individual Time: 0800-0900 & 1300-1424 & 1455-1530 OT Individual Time Calculation (min): 60 min & 84 min & 35 min   Short Term Goals: Week 1:  OT Short Term Goal 1 (Week 1): Pt will increase ROM of Rt shoulder by 10* OT Short Term Goal 2 (Week 1): pt will complete sit to stand at Mod A using LRAD OT Short Term Goal 3 (Week 1): Pt will stand with Mod A for 1 min< with LRAD in prep for ADLs  Skilled Therapeutic Interventions/Progress Updates:      Therapy Documentation Precautions:  Precautions Precautions: Fall Precaution/Restrictions Comments: watch HR, R knee buckles, spinal Restrictions Weight Bearing Restrictions Per Provider Order: Yes RUE Weight Bearing Per Provider Order: Weight bearing as tolerated RLE Weight Bearing Per Provider Order: Weight bearing as tolerated Other Position/Activity Restrictions: Per Dr. [name] on 1/19 pt can perform ROM and WB through RUE as tolerated Session 1 General:  Pt seated EOB upon OT arrival, agreeable to OT.  Pain:  5/10 pain reported in Rt knee, activity, intermittent rest breaks, distractions provided for pain management, pt reports tolerable to proceed.   ADL: OT providing skilled intervention on ADL retraining in order to increase independence with tasks and increase activity tolerance. Pt completed the following tasks at the current level of assist: Eating: set up for opening packaging occasionally  Grooming/oral hygiene: Min A this date d/t pt wanting assistance with shaving correctly d/t occasional tremors in UE Transfers: OT providing intervention with transfer training. OT raising bed for increased independence standing to RW, pt Max A for 3 trials of standing. OT then trialing lateral transfer with board, Pt CGA-Min, assistance placing the board and pt able to scoot self along  board  Exercises: Pt completed 5 minutes of arm bike in order to increase BUE/BLEstrength and endurance in preparation for increased independence in ADLs such as functional transfers. No rest break, pt keeping steady pacing with no c/o pain, on level 5 resistance.  Pt seated in W/C at end of session with W/C alarm donned, call light within reach and 4Ps assessed.   Session 2 General: Pt seated in W/C upon OT arrival, agreeable to OT.  Pain:  5/10 pain reported in Rt knee and leg, activity, intermittent rest breaks, distractions provided for pain management, pt reports tolerable to proceed.   ADL: OT providing skilled intervention on ADL retraining in order to increase independence with tasks and increase activity tolerance. Pt completed the following tasks at the current level of assist: UB dressing: SBA overall doffing and donning overhead shirt  LB dressing: Mod A overall, pt able to don underwear over RLE although requiring assistance with pants over RLE, pt able to don over left with no difficulties, within stedy pt able to manage pants over front with UE support, assistance for thoroughness in beck Footwear: total A to don lotion on feet and doff/don new socks Shower transfer: stedy transfer, Mod A to stand within stedy for multiple trials no LOB/SOB Bathing: Min A for assistance drying RLE, able to complete all other aspects of bathing   Pt seated in W/C at end of session with W/C alarm donned, call light within reach and 4Ps assessed.   Session 3 General: Pt seated in W/C upon OT arrival, agreeable to OT.  Pain:  5/10 pain reported in Rt knee, activity, intermittent rest breaks, distractions provided  for pain management, pt reports tolerable to proceed.   Exercises: Pt completed the following exercise circuit in order to improve functional activity, ROM, strength and endurance or BUEs and BLEs to prepare for ADLs such as bathing. Pt completed the following exercises in seated  position with no noted LOB/SOB: -anterior towel slides -side to side towel slides - pt lead AAROM shoulder flexion in tilt position in W/C for antigravity -horizontal abduction in tilt position -kinetron for 15 min for increased strength/endurance/ROM RLE   Pt seated in W/C at end of session with call light within reach and 4Ps assessed.    Therapy/Group: Individual Therapy  Camie Hoe, OTD, OTR/L 03/10/2024, 3:48 PM

## 2024-03-10 NOTE — Plan of Care (Signed)
" °  Problem: Consults Goal: RH GENERAL PATIENT EDUCATION Description: See Patient Education module for education specifics. Outcome: Progressing Goal: Nutrition Consult-if indicated Outcome: Progressing Goal: Diabetes Guidelines if Diabetic/Glucose > 140 Description: If diabetic or lab glucose is > 140 mg/dl - Initiate Diabetes/Hyperglycemia Guidelines & Document Interventions  Outcome: Progressing   Problem: RH BOWEL ELIMINATION Goal: RH STG MANAGE BOWEL WITH ASSISTANCE Description: STG Manage Bowel with mod I Assistance. Outcome: Progressing Goal: RH STG MANAGE BOWEL W/MEDICATION W/ASSISTANCE Description: STG Manage Bowel with Medication with mod I Assistance. Outcome: Progressing   Problem: RH SAFETY Goal: RH STG ADHERE TO SAFETY PRECAUTIONS W/ASSISTANCE/DEVICE Description: STG Adhere to Safety Precautions With cues Assistance/Device. Outcome: Progressing   Problem: RH PAIN MANAGEMENT Goal: RH STG PAIN MANAGED AT OR BELOW PT'S PAIN GOAL Description: Pain < 4 with prns Outcome: Progressing   Problem: RH KNOWLEDGE DEFICIT GENERAL Goal: RH STG INCREASE KNOWLEDGE OF SELF CARE AFTER HOSPITALIZATION Description: Patient and son will be able to manage care at discharge using educational resources independently Outcome: Progressing   "

## 2024-03-10 NOTE — Progress Notes (Signed)
 Physical Therapy Session Note  Patient Details  Name: Carl Buttery Sr. MRN: [id number] Date of Birth: [birth year]  Today's Date: 03/10/2024 PT Individual Time: 0945-1100 PT Individual Time Calculation (min): 75 min   Short Term Goals: Week 1:  PT Short Term Goal 1 (Week 1): pt will perform sit to stand with RW and max A PT Short Term Goal 2 (Week 1): pt will perform bed to chair transfer with LRAD and max A PT Short Term Goal 3 (Week 1): Pt will tolerate standing upright for ~ 2 min  Skilled Therapeutic Interventions/Progress Updates:    Pt received in PWC, agreeable to therapy. Pt with unrated R knee pain, premedicated. Rest and positioning provided as needed.   Pt navigates PWC at mod I level. Therapist provided active assist ROM for knee flexion and extension followed by knee extension stretch. Then participated in NMR using standing frame for strength and global endurance. 3 x 5 Sit to stand from sling with supervision, pt able to achieve near full hip and knee extension with cueing. Transitioned to weight shifting for pre-gait, performed x 3 bouts but required seated rest d/t R knee pain.   Pt returned to room and remained in chair, provided ice pack and reminded to elevate LE to assist with edema management.   Therapy Documentation Precautions:  Precautions Precautions: Fall Precaution/Restrictions Comments: watch HR, R knee buckles, spinal Restrictions Weight Bearing Restrictions Per Provider Order: Yes RUE Weight Bearing Per Provider Order: Weight bearing as tolerated RLE Weight Bearing Per Provider Order: Weight bearing as tolerated Other Position/Activity Restrictions: Per Dr. [name] on 1/19 pt can perform ROM and WB through RUE as tolerated General:       Therapy/Group: Individual Therapy  [name] 03/10/2024, 10:15 AM

## 2024-03-10 NOTE — Progress Notes (Signed)
 " [facility] Progress Note   Subjective:   Has appetite, a little more strength in left leg but still very limited; OT working with him this AM. Tolerated HD yesterday. Denies SOB, CP, dizziness, nausea.   Objective Vitals:   03/09/24 1849 03/09/24 2147 03/10/24 0724 03/10/24 0725  BP: 135/77 120/74 116/69   Pulse: 98 94 98   Resp: 11 18 19    Temp: 97.6 F (36.4 C) 97.9 F (36.6 C) 98.2 F (36.8 C)   TempSrc:      SpO2: 100% 100% 98%   Weight: 107 kg   107 kg  Height:       Physical Exam General: Alert male in NAD Heart: RRR, no murmur Lungs: CTA bilaterally, respirations unlabored on RA Abdomen: Soft, non-distended, +BS Extremities: 1+ edema b/l lower extremities, weakness on right side Dialysis Access:  Rt Cimino + T/b  Additional Objective Labs: Basic Metabolic Panel: Recent Labs  Lab 03/05/24 0548 03/07/24 1618 03/09/24 1445  NA 132* 129* 130*  K 4.2 5.1 5.1  CL 92* 88* 89*  CO2 26 24 25   GLUCOSE 83 112* 103*  BUN 32* 87* 72*  CREATININE 5.50* 8.89* 7.30*  CALCIUM  9.3 9.1 8.8*  PHOS 2.9 3.3 3.3   Liver Function Tests: Recent Labs  Lab 03/05/24 0548 03/07/24 1618 03/09/24 1445  ALBUMIN  2.8* 3.1* 3.1*   No results for input(s): LIPASE, AMYLASE in the last 168 hours. CBC: Recent Labs  Lab 03/07/24 1618 03/09/24 1445  WBC 9.5 9.3  HGB 6.6* 6.3*  HCT 20.5* 19.6*  MCV 89.5 88.7  PLT 422* 403*   Blood Culture    Component Value Date/Time   SDES TISSUE RIGHT SHOULDER 02/28/2024 1636   SPECREQUEST SWAB C INTRA ARTICULAR SUTURE 02/28/2024 1636   CULT  02/28/2024 1636    No growth aerobically or anaerobically. Performed at [facility], [address], [place], [name]    REPTSTATUS 03/04/2024 FINAL 02/28/2024 1636    Cardiac Enzymes: No results for input(s): CKTOTAL, CKMB, CKMBINDEX, TROPONINI in the last 168 hours. CBG: Recent Labs  Lab 03/08/24 1129 03/08/24 1656 03/08/24 2146 03/09/24 0651  03/10/24 0722  GLUCAP 90 100* 97 96 80   Iron Studies:  Recent Labs    03/10/24 0544  IRON 27*  TIBC 221*  FERRITIN 3,173*   @lablastinr3 @ Studies/Results: No results found. Medications:  ceFEPime  (MAXIPIME ) IV 2 g (03/09/24 1656)   vancomycin  1,000 mg (03/09/24 1730)    (feeding supplement) PROSource Plus  30 mL Oral BID BM   aspirin  EC  81 mg Oral Daily   atorvastatin   10 mg Oral Daily   carbamide peroxide  10 drop Right EAR QPC supper   Chlorhexidine  Gluconate Cloth  6 each Topical Q0600   cinacalcet   120 mg Oral Q breakfast   clopidogrel   75 mg Oral Daily   collagenase    Topical Daily   docusate sodium   100 mg Oral BID   epoetin  alfa  20,000 Units Subcutaneous Q M,W,F-HD   feeding supplement (KATE FARMS STANDARD 1.4) Liquid  325 mL Oral Daily   ferrous sulfate   325 mg Oral Q breakfast   folic acid   1 mg Oral Daily   gabapentin   300 mg Oral QHS   heparin   5,000 Units Subcutaneous Q8H   hydrocerin   Topical BID   insulin  aspart  0-6 Units Subcutaneous BID AC   lidocaine   1 patch Transdermal Q24H   midodrine   10 mg Oral TID  WC   multivitamin  1 tablet Oral QHS   nutrition supplement (JUVEN)  1 packet Oral BID BM   pantoprazole   40 mg Oral BID AC   psyllium  1 packet Oral Daily   selenium  sulfide  1 Application Topical Daily   sevelamer  carbonate  2,400 mg Oral TID WC    Dialysis Orders:  MWF NW 4h  B400  87.3kg  2K bath  AVF   Heparin  3000 ESA: Mircera 50 q 2 wks (last 12/15) BMM: Calcitriol  2 mcg PO q HD, cinacalcet  120 mg daily   Assessment/Plan: ESRD - on HD MWF -> Tolerated dialysis yesterday with 2 L net UF and appears to be very comfortable - Next HD tomorrow   Volume/chronic hypotension:  - HD with midodrine  support, UF as tolerated -cannot use albumin  due to refusal of blood products which limits our BP support options -encouraged patient to limit his fluid intake   Anemia of ESRD:  - Declines IV albumin /blood products - switched to high-dose  EPO 20,000u three times per week w/ HD - can adjust more frequently as needed for this pt who is Jehovah Witness - Checking iron studies today   Hyperkalemia:  - resolved   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   Otitis media w/ TM perforation:  - s/p course of abx, ENT has seen   R sided weakness:  - main c/o on admission, CVA w/u was negative   Secondary hyperparathyroidism:  - CorrCa slightly high, VDRA on hold, continue sensipar  - Phos to goal, continue sevelamer    Nutrition - Continue renal diet and protein supplements   Dispo -In CIR      "

## 2024-03-10 NOTE — Progress Notes (Signed)
 "                                                        PROGRESS NOTE   Subjective/Complaints:   Pt reports had poor sleep last night- up and down-  Given IV Iron and Aranesp  in HD yesterday- they picked him up/done in decent time.   LBM yesterday feels better- like he's back to a daily schedule. .  R knee still swollen, but he feels it's better.     ROS:  Per HPI   Pt denies SOB, abd pain, CP, N/V/C/D, and vision changes    Objective:   No results found. Recent Labs    03/07/24 1618 03/09/24 1445  WBC 9.5 9.3  HGB 6.6* 6.3*  HCT 20.5* 19.6*  PLT 422* 403*   Recent Labs    03/07/24 1618 03/09/24 1445  NA 129* 130*  K 5.1 5.1  CL 88* 89*  CO2 24 25  GLUCOSE 112* 103*  BUN 87* 72*  CREATININE 8.89* 7.30*  CALCIUM  9.1 8.8*    Intake/Output Summary (Last 24 hours) at 03/10/2024 9072 Last data filed at 03/09/2024 1849 Gross per 24 hour  Intake --  Output 2000 ml  Net -2000 ml     Wound 03/03/24 0330 Pressure Injury Sacrum Left Unstageable - Full thickness tissue loss in which the base of the injury is covered by slough (yellow, tan, gray, green or brown) and/or eschar (tan, brown or black) in the wound bed. (Active)    Physical Exam: Vital Signs Blood pressure 116/69, pulse 98, temperature 98.2 F (36.8 C), resp. rate 19, height 6' 2 (1.88 m), weight 107 kg, SpO2 98%.     General: awake, alert, appropriate, sitting up slightly in bed- helped get in appropriate position to eat; NAD HENT: conjugate gaze; oropharynx moist- spot on head covered in salve- less reddened;  CV: regular rate and rhythm; no JVD Pulmonary: CTA B/L; no W/R/R- good air movement GI: soft, NT, ND, (+)BS Psychiatric: appropriate- interactive Neurological: Ox3 Extremities: R knee is 3+ edema- but incision healing  Skin: scalp with dry skin, denuded area where he's scratched. Slightly larger than dollar sized coin- Healing surgical wounds on right shoulder and knee c/d/I, sutures  intact at R shoulder wounds. Looks good Sacral wound as below-- not reassessed  Neuro: A&O x3, follows commands, normal speech and migraines, cranial nerves II to XII grossly intact  PRIOR EXAMS: Neuro:  Alert and oriented x 3. Normal insight and awareness. Intact Memory. Normal language and speech. Cranial nerve exam unremarkable. MMT: LUE 5/5/ RUE limited proximally due to pain but 5/5 distally. RLE 2/5 HF,KE pain, 4+ ankle. LLE 4/5 prox to 5/5 distal. Decreased LT in feet.   Musculoskeletal: right knee with swelling,  tender. Right shoulder  tender.     Assessment/Plan: 1. Functional deficits which require 3+ hours per day of interdisciplinary therapy in a comprehensive inpatient rehab setting. Physiatrist is providing close team supervision and 24 hour management of active medical problems listed below. Physiatrist and rehab team continue to assess barriers to discharge/monitor patient progress toward functional and medical goals  Care Tool:  Bathing              Bathing assist Assist Level: Set up assist     Upper Body Dressing/Undressing Upper body dressing  Upper body assist Assist Level: Set up assist    Lower Body Dressing/Undressing Lower body dressing            Lower body assist Assist for lower body dressing: Moderate Assistance - Patient 50 - 74%     Toileting Toileting    Toileting assist Assist for toileting: 2 Helpers     Transfers Chair/bed transfer  Transfers assist     Chair/bed transfer assist level: Contact Guard/Touching assist     Locomotion Ambulation   Ambulation assist   Ambulation activity did not occur: Safety/medical concerns          Walk 10 feet activity   Assist  Walk 10 feet activity did not occur: Safety/medical concerns        Walk 50 feet activity   Assist Walk 50 feet with 2 turns activity did not occur: Safety/medical concerns         Walk 150 feet activity   Assist Walk 150 feet  activity did not occur: Safety/medical concerns         Walk 10 feet on uneven surface  activity   Assist Walk 10 feet on uneven surfaces activity did not occur: Safety/medical concerns         Wheelchair     Assist Is the patient using a wheelchair?: Yes Type of Wheelchair: Power    Wheelchair assist level: Independent Max wheelchair distance: 150'    Wheelchair 50 feet with 2 turns activity    Assist        Assist Level: Independent   Wheelchair 150 feet activity     Assist      Assist Level: Independent   Blood pressure 116/69, pulse 98, temperature 98.2 F (36.8 C), resp. rate 19, height 6' 2 (1.88 m), weight 107 kg, SpO2 98%.  Medical Problem List and Plan: 1. Functional deficits secondary to septic right knee and shoulder             -patient may shower if incisons are covered             -ELOS/Goals: 15-20 days, min assist goals with PT and OT, W/C level?  Con't CIR PT and OT  Team conference today- to determine length of stay -will go over medical and functional issues.    2.  Antithrombotics:  -DVT/anticoagulation:  Pharmaceutical: Heparin  5kU q8h due to ESRD             -antiplatelet therapy: ASA/Plavix  daily  3. Pain Management: tylenol  prn, oxycodone  prn. Lidoderm  patches.  -pt will take oxycodone  prior to therapies based on his schedule. I won't schedule it  for now.   1/26- 1/27- Pain meds prn working for him- con't regimen 4. Mood/Behavior/Sleep:  LCSW to follow for evaluation and support.              -antipsychotic agents: N/A  -Melatonin PRN  5. Neuropsych/cognition: This patient is capable of making decisions on his own behalf.  6. Skin/Wound Care: surgical incisions CDI, healing  -air mattress for sacral wound, WOC. Santyl  to area of necrotic tissue  -have discussed importance of nutrition with pt  -medicated shampoo for scalp was helpful -03/07/24 pt inquiring about sutures R shoulder-- looks like surgery was 1/16,  weekday team to touch base with ortho regarding timing of removal but likely this coming week.   1/26- can likely remove sutures 1/30 7. Fluids/Electrolytes/Nutrition: Strict I/O. Renal diet w/1200 cc/FR. Continue vitamins/supplements. Continue Protonix  40mg  BID  -labs reviewed, stable  1/26- labs pending 8.  Septic arthritis R-knee/R-shoulder: To continue Cefepime  and Vanc with HD thru 04/10/24             --shoulder to be aspirated in 8 weeks per Dr. [name]?  9.  Chronic hypotension: On midodrine  10mg  TID with prn doses. Stable   -1/25 BP stable overall continue current regimen and monitor  1/26- 1/27- BP slightly soft, but denies symptoms Vitals:   03/09/24 1454 03/09/24 1500 03/09/24 1530 03/09/24 1600  BP: 120/71 (!) 163/85 119/67 137/77   03/09/24 1630 03/09/24 1700 03/09/24 1730 03/09/24 1800  BP: (!) 141/79 129/77 (!) 143/86 131/71   03/09/24 1830 03/09/24 1849 03/09/24 2147 03/10/24 0724  BP: 123/81 135/77 120/74 116/69     10. ESRD: HD MWF continue at the end of the day to help with tolerance of therapy. Continue Cinacalcet  and Sevelamer              --chronic hyponatremia managed with HD/FR  -have requested a piece of bacon or sausage on breakfast plate  -8/75/73 dialysis delayed to today (Sat) d/t census  -1/25 reviewed nephrology note, next HD tomorrow.  1/26- Na last 129- labs pending- HD today  1/27- Na 127-  11. T2DM: A1C- 5.1. Will continue to monitor BS ac/hs and if controlled decrease to twice a day -03/08/23 CBGs very well controlled, monitor thru weekend, could decrease to BID  -1/25 CBGs controlled continue to monitor  1/26- will reduce CBGs and SSI to BID-AC CBG (last 3)  Recent Labs    03/08/24 2146 03/09/24 0651 03/10/24 0722  GLUCAP 97 96 80     12. Anemia of chronic disease: Jehovah's witness and refuses blood transfusion. Hgb ~8, continue Aranesp  and iron supplement and folic acid   -1/25 hemoglobin yesterday down to 6.6, he denies any bleeding.  Will  check occult stool test.  Nephrology planning for high-dose EPO.  Nephrology ordered iron studies today and considering IV iron.  Patient declined blood transfusion, reports he understands risks.  1/26- labs pending- iron studies today as well   1/27- Given Aranesp  and Iron in HD yesterday along with IV ABX- Hb was 6.3! Will recheck Thursday- but not daily, or will drain him more.  13. L suppurative Otitis media with TM perforation: Has completed antibiotic course.  --Follow up with [name] for evaluation. -Getting debrox for R ear cerumen impaction, DO NOT USE IN LEFT EAR  14. Cervical myelopathy: Gabapentin  300mg  nightly for neuropathic pain  15. PAD s/p R-SFA/popliteal lithotripsy/Stent: On ASA, Plavix  and Lipitor 10mg  daily    16. Constipation: continue Colace 100mg  BID, psyllium packet daily  -1/25 LBM today  1/26- trying to have BM today as well- feeling constipated  1/27- LBM yesterday AM- feels like more on daily schedule now   I spent a total of  52  minutes on total care today- >50% coordination of care- due to  D/w pt and review of notes, labs, including Hb 6.3- pt still doesn't want blood- due to religious beliefs; also went over B/B- and team conference today to determine LOS.   LOS: 7 days A FACE TO FACE EVALUATION WAS PERFORMED  [name] 03/10/2024, 9:27 AM     "

## 2024-03-11 LAB — GLUCOSE, CAPILLARY
Glucose-Capillary: 73 mg/dL (ref 70–99)
Glucose-Capillary: 78 mg/dL (ref 70–99)

## 2024-03-11 LAB — CBC
HCT: 19.7 % — ABNORMAL LOW (ref 39.0–52.0)
Hemoglobin: 6.4 g/dL — CL (ref 13.0–17.0)
MCH: 28.8 pg (ref 26.0–34.0)
MCHC: 32.5 g/dL (ref 30.0–36.0)
MCV: 88.7 fL (ref 80.0–100.0)
Platelets: 372 10*3/uL (ref 150–400)
RBC: 2.22 MIL/uL — ABNORMAL LOW (ref 4.22–5.81)
RDW: 15.2 % (ref 11.5–15.5)
WBC: 8.9 10*3/uL (ref 4.0–10.5)
nRBC: 0 % (ref 0.0–0.2)

## 2024-03-11 LAB — HEPATITIS B SURFACE ANTIGEN: Hepatitis B Surface Ag: NONREACTIVE

## 2024-03-11 LAB — RENAL FUNCTION PANEL
Albumin: 3.2 g/dL — ABNORMAL LOW (ref 3.5–5.0)
Anion gap: 18 — ABNORMAL HIGH (ref 5–15)
BUN: 62 mg/dL — ABNORMAL HIGH (ref 8–23)
CO2: 25 mmol/L (ref 22–32)
Calcium: 8.9 mg/dL (ref 8.9–10.3)
Chloride: 88 mmol/L — ABNORMAL LOW (ref 98–111)
Creatinine, Ser: 6.6 mg/dL — ABNORMAL HIGH (ref 0.61–1.24)
GFR, Estimated: 9 mL/min — ABNORMAL LOW
Glucose, Bld: 88 mg/dL (ref 70–99)
Phosphorus: 3.6 mg/dL (ref 2.5–4.6)
Potassium: 4.9 mmol/L (ref 3.5–5.1)
Sodium: 131 mmol/L — ABNORMAL LOW (ref 135–145)

## 2024-03-11 MED ORDER — MIDODRINE HCL 5 MG PO TABS
ORAL_TABLET | ORAL | Status: AC
  Filled 2024-03-11: qty 2

## 2024-03-11 MED ORDER — HEPARIN SODIUM (PORCINE) 1000 UNIT/ML IJ SOLN
INTRAMUSCULAR | Status: AC
  Filled 2024-03-11: qty 3

## 2024-03-11 MED ORDER — VANCOMYCIN HCL IN DEXTROSE 1-5 GM/200ML-% IV SOLN
INTRAVENOUS | Status: AC
  Filled 2024-03-11: qty 200

## 2024-03-11 MED ORDER — HEPARIN SODIUM (PORCINE) 1000 UNIT/ML DIALYSIS
3000.0000 [IU] | Freq: Once | INTRAMUSCULAR | Status: AC
  Administered 2024-03-11: 3000 [IU] via INTRAVENOUS_CENTRAL

## 2024-03-11 NOTE — Progress Notes (Signed)
 D/c date of 2/12 noted. Contacted out-pt HD clinic [facility] to inform of this and to anticipate pt back at clinic on 2/13, as of now. Will continue to follow along and update as needed.   Sihaam Chrobak Dialysis Navigator [phone]

## 2024-03-11 NOTE — Progress Notes (Signed)
 "                                                        PROGRESS NOTE   Subjective/Complaints:   Pt reports refusing heparin  shot since one yesterday wouldn't stop bleeding- even after a shower, was oozing- refused last 2 heparin  shots.  Thought was on another blood thinner- is on Plavix , but went over that it doesn't reduce chance of DVT/s Pe's.   He was upset power w/c died, but then just needed to be plugged back in per nursing and pt.    ROS:  Per HPI   Pt denies SOB, abd pain, CP, N/V/C/D, and vision changes    Objective:   No results found. Recent Labs    03/09/24 1445  WBC 9.3  HGB 6.3*  HCT 19.6*  PLT 403*   Recent Labs    03/09/24 1445  NA 130*  K 5.1  CL 89*  CO2 25  GLUCOSE 103*  BUN 72*  CREATININE 7.30*  CALCIUM  8.8*    Intake/Output Summary (Last 24 hours) at 03/11/2024 0831 Last data filed at 03/10/2024 1900 Gross per 24 hour  Intake 720 ml  Output --  Net 720 ml     Wound 03/03/24 0330 Pressure Injury Sacrum Left Unstageable - Full thickness tissue loss in which the base of the injury is covered by slough (yellow, tan, gray, green or brown) and/or eschar (tan, brown or black) in the wound bed. (Active)    Physical Exam: Vital Signs Blood pressure 113/60, pulse 91, temperature 98.7 F (37.1 C), resp. rate 19, height 6' 2 (1.88 m), weight 106.3 kg, SpO2 100%.      General: awake, alert, appropriate,  sitting up in bed; trying to get situated to eat; nurse in room; NAD HENT: conjugate gaze; oropharynx moist CV: regular rate and rhythm; no JVD Pulmonary: CTA B/L; no W/R/R- good air movement GI: soft, NT, ND, (+)BS- hypoactive, hasn't eaten yet Psychiatric: appropriate- nurse and he are razzing each other some Neurological: Ox3  Extremities: R knee is 3+ edema- but incision healing- same today  Skin: scalp with dry skin, denuded area where he's scratched. Slightly larger than dollar sized coin- Healing surgical wounds on right  shoulder and knee c/d/I, sutures intact at R shoulder wounds. Looks good Sacral wound as below-- not reassessed  Neuro: A&O x3, follows commands, normal speech and migraines, cranial nerves II to XII grossly intact  PRIOR EXAMS: Neuro:  Alert and oriented x 3. Normal insight and awareness. Intact Memory. Normal language and speech. Cranial nerve exam unremarkable. MMT: LUE 5/5/ RUE limited proximally due to pain but 5/5 distally. RLE 2/5 HF,KE pain, 4+ ankle. LLE 4/5 prox to 5/5 distal. Decreased LT in feet.   Musculoskeletal: right knee with swelling,  tender. Right shoulder  tender.     Assessment/Plan: 1. Functional deficits which require 3+ hours per day of interdisciplinary therapy in a comprehensive inpatient rehab setting. Physiatrist is providing close team supervision and 24 hour management of active medical problems listed below. Physiatrist and rehab team continue to assess barriers to discharge/monitor patient progress toward functional and medical goals  Care Tool:  Bathing              Bathing assist Assist Level: Set up assist     Upper  Body Dressing/Undressing Upper body dressing        Upper body assist Assist Level: Set up assist    Lower Body Dressing/Undressing Lower body dressing            Lower body assist Assist for lower body dressing: Moderate Assistance - Patient 50 - 74%     Toileting Toileting    Toileting assist Assist for toileting: 2 Helpers     Transfers Chair/bed transfer  Transfers assist     Chair/bed transfer assist level: Contact Guard/Touching assist     Locomotion Ambulation   Ambulation assist   Ambulation activity did not occur: Safety/medical concerns          Walk 10 feet activity   Assist  Walk 10 feet activity did not occur: Safety/medical concerns        Walk 50 feet activity   Assist Walk 50 feet with 2 turns activity did not occur: Safety/medical concerns         Walk 150 feet  activity   Assist Walk 150 feet activity did not occur: Safety/medical concerns         Walk 10 feet on uneven surface  activity   Assist Walk 10 feet on uneven surfaces activity did not occur: Safety/medical concerns         Wheelchair     Assist Is the patient using a wheelchair?: Yes Type of Wheelchair: Power    Wheelchair assist level: Independent Max wheelchair distance: 150'    Wheelchair 50 feet with 2 turns activity    Assist        Assist Level: Independent   Wheelchair 150 feet activity     Assist      Assist Level: Independent   Blood pressure 113/60, pulse 91, temperature 98.7 F (37.1 C), resp. rate 19, height 6' 2 (1.88 m), weight 106.3 kg, SpO2 100%.  Medical Problem List and Plan: 1. Functional deficits secondary to septic right knee and shoulder             -patient may shower if incisons are covered             -ELOS/Goals: 15-20 days, min assist goals with PT and OT, W/C level?  D/c date 2/12  Con't CIR PT and OT  Using power w/c currently- will probably need specialty w/c, but will double check 2.  Antithrombotics:  -DVT/anticoagulation:  Pharmaceutical: Heparin  5kU q8h due to ESRD 1/28- advised pt to take his Heparin  since Plavix  doesn't cover DVT prophylaxis             -antiplatelet therapy: ASA/Plavix  daily  3. Pain Management: tylenol  prn, oxycodone  prn. Lidoderm  patches.  -pt will take oxycodone  prior to therapies based on his schedule. I won't schedule it  for now.   1/26- 1/27- Pain meds prn working for him- con't regimen 4. Mood/Behavior/Sleep:  LCSW to follow for evaluation and support.              -antipsychotic agents: N/A  -Melatonin PRN  5. Neuropsych/cognition: This patient is capable of making decisions on his own behalf.  6. Skin/Wound Care: surgical incisions CDI, healing  -air mattress for sacral wound, WOC. Santyl  to area of necrotic tissue  -have discussed importance of nutrition with  pt  -medicated shampoo for scalp was helpful -03/07/24 pt inquiring about sutures R shoulder-- looks like surgery was 1/16, weekday team to touch base with ortho regarding timing of removal but likely this coming week.   1/26-  can likely remove sutures 1/30  1/28- Pt on regular bed- didn't like air mattress 7. Fluids/Electrolytes/Nutrition: Strict I/O. Renal diet w/1200 cc/FR. Continue vitamins/supplements. Continue Protonix  40mg  BID  -labs reviewed, stable  1/26- labs pending 8.  Septic arthritis R-knee/R-shoulder: To continue Cefepime  and Vanc with HD thru 04/10/24             --shoulder to be aspirated in 8 weeks per Dr. [name]?  9.  Chronic hypotension: On midodrine  10mg  TID with prn doses. Stable   -1/25 BP stable overall continue current regimen and monitor  1/26- 1/27- BP slightly soft, but denies symptoms  1/28- running a little soft, but overall, not symptomatic in chart that I can find/per pt Vitals:   03/09/24 1600 03/09/24 1630 03/09/24 1700 03/09/24 1730  BP: 137/77 (!) 141/79 129/77 (!) 143/86   03/09/24 1800 03/09/24 1830 03/09/24 1849 03/09/24 2147  BP: 131/71 123/81 135/77 120/74   03/10/24 0724 03/10/24 1333 03/10/24 1943 03/11/24 0655  BP: 116/69 103/72 119/66 113/60     10. ESRD: HD MWF continue at the end of the day to help with tolerance of therapy. Continue Cinacalcet  and Sevelamer              --chronic hyponatremia managed with HD/FR  -have requested a piece of bacon or sausage on breakfast plate  -8/75/73 dialysis delayed to today (Sat) d/t census  -1/25 reviewed nephrology note, next HD tomorrow.  1/26- Na last 129- labs pending- HD today  1/27- Na 127- 1/28- labs tomorrow  11. T2DM: A1C- 5.1. Will continue to monitor BS ac/hs and if controlled decrease to twice a day  1/26- will reduce CBGs and SSI to BID-AC  1/28- look great- cont' regimen CBG (last 3)  Recent Labs    03/10/24 0722 03/10/24 1729 03/11/24 0653  GLUCAP 80 112* 73     12. Anemia of  chronic disease: Jehovah's witness and refuses blood transfusion. Hgb ~8, continue Aranesp  and iron supplement and folic acid   -1/25 hemoglobin yesterday down to 6.6, he denies any bleeding.  Will check occult stool test.  Nephrology planning for high-dose EPO.  Nephrology ordered iron studies today and considering IV iron.  Patient declined blood transfusion, reports he understands risks.  1/26- labs pending- iron studies today as well   1/27- Given Aranesp  and Iron in HD yesterday along with IV ABX- Hb was 6.3! Will recheck Thursday- but not daily, or will drain him more.   1/28- pt thought had labs in HD yesterday- did not, so did go over the Hb of 6.3 with him. On Aranesp  and PO/IV iron 13. L suppurative Otitis media with TM perforation: Has completed antibiotic course.  --Follow up with [name] for evaluation. -Getting debrox for R ear cerumen impaction, DO NOT USE IN LEFT EAR  14. Cervical myelopathy: Gabapentin  300mg  nightly for neuropathic pain  15. PAD s/p R-SFA/popliteal lithotripsy/Stent: On ASA, Plavix  and Lipitor 10mg  daily    16. Constipation: continue Colace 100mg  BID, psyllium packet daily  -1/25 LBM today  1/26- trying to have BM today as well- feeling constipated  1/27- LBM yesterday AM- feels like more on daily schedule now   I spent a total of  37   minutes on total care today- >50% coordination of care- due to  D/w pt with and nursing about Heparin , plavix  and his concerns this AM as stated above- also reviewed older labs and went over again with pt.    LOS: 8 days A FACE TO  FACE EVALUATION WAS PERFORMED  Bronwen Pendergraft 03/11/2024, 8:31 AM     "

## 2024-03-11 NOTE — Progress Notes (Signed)
 " [place] [name] Progress Note   Subjective:   Has appetite, a little more strength in left leg but still very limited; OT/PT working with him this AM. Tolerated HD Monday, decent appetite. Denies SOB, CP, dizziness, nausea.   Objective Vitals:   03/10/24 1333 03/10/24 1943 03/11/24 0655 03/11/24 0656  BP: 103/72 119/66 113/60   Pulse: 91 84 91   Resp: 16 16 19    Temp: 98.1 F (36.7 C) 97.6 F (36.4 C) 98.7 F (37.1 C)   TempSrc: Oral Oral    SpO2: 98% 99% 100%   Weight:    106.3 kg  Height:       Physical Exam General: Alert male in NAD Heart: RRR, no murmur Lungs: CTA bilaterally, respirations unlabored on RA Abdomen: Soft, non-distended, +BS Extremities: 1+ edema b/l lower extremities, weakness on right side Dialysis Access:  Rt Cimino + T/b  Additional Objective Labs: Basic Metabolic Panel: Recent Labs  Lab 03/05/24 0548 03/07/24 1618 03/09/24 1445  NA 132* 129* 130*  K 4.2 5.1 5.1  CL 92* 88* 89*  CO2 26 24 25   GLUCOSE 83 112* 103*  BUN 32* 87* 72*  CREATININE 5.50* 8.89* 7.30*  CALCIUM  9.3 9.1 8.8*  PHOS 2.9 3.3 3.3   Liver Function Tests: Recent Labs  Lab 03/05/24 0548 03/07/24 1618 03/09/24 1445  ALBUMIN  2.8* 3.1* 3.1*   No results for input(s): LIPASE, AMYLASE in the last 168 hours. CBC: Recent Labs  Lab 03/07/24 1618 03/09/24 1445  WBC 9.5 9.3  HGB 6.6* 6.3*  HCT 20.5* 19.6*  MCV 89.5 88.7  PLT 422* 403*   Blood Culture    Component Value Date/Time   SDES TISSUE RIGHT SHOULDER 02/28/2024 1636   SPECREQUEST SWAB C INTRA ARTICULAR SUTURE 02/28/2024 1636   CULT  02/28/2024 1636    No growth aerobically or anaerobically. Performed at [facility], [address] [place], [name]    REPTSTATUS 03/04/2024 FINAL 02/28/2024 1636    Cardiac Enzymes: No results for input(s): CKTOTAL, CKMB, CKMBINDEX, TROPONINI in the last 168 hours. CBG: Recent Labs  Lab 03/08/24 2146 03/09/24 0651  03/10/24 0722 03/10/24 1729 03/11/24 0653  GLUCAP 97 96 80 112* 73   Iron Studies:  Recent Labs    03/10/24 0544  IRON 27*  TIBC 221*  FERRITIN 3,173*   @lablastinr3 @ Studies/Results: No results found. Medications:  ceFEPime  (MAXIPIME ) IV 2 g (03/09/24 1656)   vancomycin  1,000 mg (03/09/24 1730)    (feeding supplement) PROSource Plus  30 mL Oral BID BM   aspirin  EC  81 mg Oral Daily   atorvastatin   10 mg Oral Daily   carbamide peroxide  10 drop Right EAR QPC supper   Chlorhexidine  Gluconate Cloth  6 each Topical Q0600   cinacalcet   120 mg Oral Q breakfast   clopidogrel   75 mg Oral Daily   collagenase    Topical Daily   docusate sodium   100 mg Oral BID   epoetin  alfa  20,000 Units Subcutaneous Q M,W,F-HD   feeding supplement ([employer] STANDARD 1.4) Liquid  325 mL Oral Daily   ferrous sulfate   325 mg Oral Q breakfast   folic acid   1 mg Oral Daily   gabapentin   300 mg Oral QHS   heparin   5,000 Units Subcutaneous Q8H   hydrocerin   Topical BID   insulin  aspart  0-6 Units Subcutaneous BID AC   lidocaine   1 patch Transdermal Q24H   midodrine   10 mg Oral  TID WC   multivitamin  1 tablet Oral QHS   nutrition supplement (JUVEN)  1 packet Oral BID BM   pantoprazole   40 mg Oral BID AC   psyllium  1 packet Oral Daily   selenium  sulfide  1 Application Topical Daily   sevelamer  carbonate  2,400 mg Oral TID WC    Dialysis Orders:  MWF NW 4h  B400  87.3kg  2K bath  AVF   Heparin  3000 ESA: Mircera 50 q 2 wks (last 12/15) BMM: Calcitriol  2 mcg PO q HD, cinacalcet  120 mg daily   Assessment/Plan: ESRD - on HD MWF -> Tolerated dialysis Monday  with 2 L net UF and appears to be very comfortable - Next HD today with planned 3L as tolerated   Volume/chronic hypotension:  - HD with midodrine  support, UF as tolerated -cannot use albumin  due to refusal of blood products which limits our BP support options -encouraged patient to limit his fluid intake   Anemia of ESRD:  -  Declines IV albumin /blood products - switched to high-dose EPO 20,000u three times per week w/ HD - can adjust more frequently as needed for this pt who is Jehovah Witness - Checking iron studies today   Hyperkalemia:  - resolved   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   Otitis media w/ TM perforation:  - s/p course of abx, ENT has seen   R sided weakness:  - main c/o on admission, CVA w/u was negative   Secondary hyperparathyroidism:  - CorrCa slightly high, VDRA on hold, continue sensipar  - Phos to goal, continue sevelamer    Nutrition - Continue renal diet and protein supplements   Dispo -In CIR      "

## 2024-03-11 NOTE — Progress Notes (Signed)
 Physical Therapy Session Note  Patient Details  Name: Carl Brittian Sr. MRN: [id number] Date of Birth: 1960-02-21  Today's Date: 03/11/2024 PT Individual Time: 0900-1000 PT Individual Time Calculation (min): 60 min   Short Term Goals: Week 1:  PT Short Term Goal 1 (Week 1): pt will perform sit to stand with RW and max A PT Short Term Goal 2 (Week 1): pt will perform bed to chair transfer with LRAD and max A PT Short Term Goal 3 (Week 1): Pt will tolerate standing upright for ~ 2 min Week 2:     Skilled Therapeutic Interventions/Progress Updates: Pt presented in PWC agreable to therapy. Pt c/o pain in knee which states has improved. Requesting to work on UE as was able to perform a stand from elevated PWC with OT this am. Pt navigated to ortho gym and participated in UBE L5 x 10 min forwards/backwards (5 min each). Pt then moved to main gym and participated in the following UE strengthening activities.  Biceps curls 4lb dowel 2 x 15 Chest press 3lb dowel 2 x 15 AROM shoulder flexion 2 x 10 Rows with red theraband 2 x 15 Triceps extensions with yellow theraband on R and red theraband on L: 2 x 12 ea  Pt then navigated to day room and set up at [employer] participated x 5 min at 40cm/sec for increased LE ms recruitment.   Pt discussed desire to don shoes, pt with edema in RLE which he has been elevating. Currently ace wrapped, spoke with PA regarding use of TED hose for edema management, received verbal order ok to use [facility].   Pt navigated back to room at end of session and remained in PWC with current needs met.      Therapy Documentation Precautions:  Precautions Precautions: Fall Precaution/Restrictions Comments: watch HR, R knee buckles, spinal Restrictions Weight Bearing Restrictions Per Provider Order: Yes RUE Weight Bearing Per Provider Order: Weight bearing as tolerated RLE Weight Bearing Per Provider Order: Weight bearing as tolerated Other Position/Activity  Restrictions: Per Dr. [name] on 1/19 pt can perform ROM and WB through RUE as tolerated  Therapy/Group: Individual Therapy  Sydne Krahl 03/11/2024, 4:33 PM

## 2024-03-11 NOTE — Plan of Care (Signed)
" °  Problem: Consults Goal: RH GENERAL PATIENT EDUCATION Description: See Patient Education module for education specifics. Outcome: Progressing Goal: Nutrition Consult-if indicated Outcome: Progressing Goal: Diabetes Guidelines if Diabetic/Glucose > 140 Description: If diabetic or lab glucose is > 140 mg/dl - Initiate Diabetes/Hyperglycemia Guidelines & Document Interventions  Outcome: Progressing   Problem: RH BOWEL ELIMINATION Goal: RH STG MANAGE BOWEL WITH ASSISTANCE Description: STG Manage Bowel with mod I Assistance. Outcome: Progressing Goal: RH STG MANAGE BOWEL W/MEDICATION W/ASSISTANCE Description: STG Manage Bowel with Medication with mod I Assistance. Outcome: Progressing   Problem: RH SAFETY Goal: RH STG ADHERE TO SAFETY PRECAUTIONS W/ASSISTANCE/DEVICE Description: STG Adhere to Safety Precautions With cues Assistance/Device. Outcome: Progressing   Problem: RH PAIN MANAGEMENT Goal: RH STG PAIN MANAGED AT OR BELOW PT'S PAIN GOAL Description: Pain < 4 with prns Outcome: Progressing   Problem: RH KNOWLEDGE DEFICIT GENERAL Goal: RH STG INCREASE KNOWLEDGE OF SELF CARE AFTER HOSPITALIZATION Description: Patient and son will be able to manage care at discharge using educational resources independently Outcome: Progressing   "

## 2024-03-11 NOTE — Progress Notes (Signed)
 Occupational Therapy Session Note  Patient Details  Name: Carl Antonelli Sr. MRN: [id number] Date of Birth: [birth year]  Today's Date: 03/11/2024 OT Individual Time: 0800-0900 & 1100-1200 OT Individual Time Calculation (min): 60 min & 60 min   Short Term Goals: Week 1:  OT Short Term Goal 1 (Week 1): Pt will increase ROM of Rt shoulder by 10* OT Short Term Goal 2 (Week 1): pt will complete sit to stand at Mod A using LRAD OT Short Term Goal 3 (Week 1): Pt will stand with Mod A for 1 min< with LRAD in prep for ADLs  Skilled Therapeutic Interventions/Progress Updates:      Therapy Documentation Precautions:  Precautions Precautions: Fall Precaution/Restrictions Comments: watch HR, R knee buckles, spinal Restrictions Weight Bearing Restrictions Per Provider Order: Yes RUE Weight Bearing Per Provider Order: Weight bearing as tolerated RLE Weight Bearing Per Provider Order: Weight bearing as tolerated Other Position/Activity Restrictions: Per Dr. [name] on 1/19 pt can perform ROM and WB through RUE as tolerated Session 1 General:  Pt supine in bed upon OT arrival, agreeable to OT session.  Pain:  5/10 pain reported in Rt knee, activity, intermittent rest breaks, distractions provided for pain management, pt reports tolerable to proceed.   ADL: OT providing skilled intervention on ADL retraining in order to increase independence with tasks and increase activity tolerance. Pt completed the following tasks at the current level of assist: Bed mobility: Min A supine>EOB with assistance managing RLE off of bed  Transfers: Pt completed first stand pivot transfer!! Initially Max A to rise into standing from raised bed, fading to Mod after transfer training during session, Min A for stand step transfer from bed>W/C with RW. OT then providing transfer training for session, see above for assist levels.   Other Treatments: OT providing ACE wrap for RLE and donning for edema management.   Pt seated  in W/C at end of session with call light within reach and 4Ps assessed.    Session 2 General:  Pt seated in W/C upon OT arrival, agreeable to OT.  Pain: 5/10 pain reported in Rt knee, activity, intermittent rest breaks, distractions provided for pain management, pt reports tolerable to proceed.   Exercises: Pt completed the following exercise circuit in order to improve functional activity, strength and endurance to prepare for ADLs such as bathing. Pt completed the following exercises in seated position with no noted LOB/SOB and multiple 1:00 repetitions on each exercise with PRN rest breaks in between : -anterior/posterior towel slides with RLE -lateral towel slides with RLE -clockwise/counterclockwise towel slides with RLE  Pt completed 10 minutes of arm bike in order to increase BUE/BLEstrength and endurance in preparation for increased independence in ADLs such as functional transfers. Brief rest breaks PRN, on level 6 resistance.   At end of session, Pt completed transfer from PWC>bed at Mod A to stand, Min A for stand step with RW. Pt completed EOB>supine with SBA. Pt supine in bed with bed alarm activated, 2 bed rails up, call light within reach and 4Ps assessed.   Therapy/Group: Individual Therapy  Camie Hoe, OTD, OTR/L 03/11/2024, 7:28 PM

## 2024-03-11 NOTE — Progress Notes (Signed)
 Nutrition Follow-up  DOCUMENTATION CODES:   Non-severe (moderate) malnutrition in context of chronic illness  INTERVENTION:  Discontinue [employer] per pt request  Continue ProSource Plus; each supplement provides 100 kcal and 15 g protein  Continue Juven BID to support wound healing  Encouraged adequate intake of meals and supplements to meet calorie and protein needs  NUTRITION DIAGNOSIS:   Moderate Malnutrition related to chronic illness as evidenced by moderate fat depletion, mild fat depletion, mild muscle depletion, severe muscle depletion.  Diagnosis updated 1/28  GOAL:   Patient will meet greater than or equal to 90% of their needs  Progressing  MONITOR:   PO intake, Supplement acceptance  REASON FOR ASSESSMENT:   Consult Diet education, Wound healing  ASSESSMENT:   PMH: renal cancer, ESRD, HD, PUD, HTN, C4 spinal fracture, R cholectomy, infection in right knee  Admitted with: debility, wounds on knee, sacrum, shoulder  Spoke with pt who was resting in bedside WC. Pt reports therapy has been going well and he feels like he continues to make progress. Pt reports appetite has been good, but the food this past weekend was not good (there were limited options due to winter weather). Discussed that pt should have more options now that normal menu is available. Pt had also requested breakfast meat like bacon on sausage on breakfast tray since it is restricted under renal diet, MD updated [employer] to allow. Pt dealing with constipation, currently on colace and psyllium, does not want to advance bowel regimen yet.  Pt reports he likes Juven supplement and ProSource but does not like [employer], discontinued supplement per request. Pt reports HD treatments have been going well but feels like he is not getting enough fluid removed. Per nephrology, UF goal is titrating up based on pt's tolerance of treatments and chronic hypotension. Recently, have been unable to pull off  enough fluid due to pt requiring midodrine  support. Pt continues to have mild pitting edema in lower legs and pt's weight is up from EDW. Pt reports EDW usually around 188-189 #. Last recorded wt was not standing wt and was 240#. Pt suspects that is inaccurate and states a standing wt would be lower but still more than his EDW.   Nutrition focused physical exam shows severe muscle depletions and moderate fat depletion. Suspect malnutrition in the setting of ESRD.  PTA, pt reports he was eating 2-3 x per day and reports good appetite on dialysis days. Pt reports he watched what he ate and tried to stick close to a renal diet. Pt avoided fried foods and tried to eat hibachi or egg sandwiches when eating out. Pt's son also encouraged pt to monitor foods he ate to help him while on dialysis. Pt continues phos binder with meals and continues RenaVit daily. Pt requiring EPO 3 x weekly since he refuses IV albumin  and blood products as Jehovah Witness.   Average Meal Completion: 1/23-1/28: 79% average intake x 8  recorded meals  Nutritionally Relevant Medications: Prosource Plus BID Colace BID EPO 3 x weekly Ferrous sulfate  daily Folic acid  daily SSI 0-6 units BID Midodrine  TID Rena Vit Juven BID Protonix  BID Psyllium Renvela    Labs reviewed: CBG x 24 h: 73-112 mg/dL Sodium 868 BUN 62 Cr 3.39 Phos 3.6 (WNL) Albumin  3.2 Hgb 6.4  Admit weight: 86 kg Current weight: 108.9 kg (suspect inaccurate, needs standing weight or bed to be zeroed prior to recording)  Dialysis Orders: MWF NW 4h  B400  87.3kg  2K bath  AVF   Heparin  3000 ESA: Mircera 50 q 2 wks (last 12/15) BMM: Calcitriol  2 mcg PO q HD, cinacalcet  120 mg daily  Last HD: 1/26 UF 2L Next HD: 1/28 UF goal 3L   NUTRITION - FOCUSED PHYSICAL EXAM:  Flowsheet Row Most Recent Value  Orbital Region Mild depletion  Upper Arm Region Moderate depletion  Thoracic and Lumbar Region Moderate depletion  Buccal Region No depletion   Temple Region Severe depletion  Clavicle Bone Region Severe depletion  Clavicle and Acromion Bone Region Severe depletion  Scapular Bone Region Severe depletion  Dorsal Hand Severe depletion  Patellar Region Mild depletion  [only assessed L,  R swollen post op]  Anterior Thigh Region Mild depletion  [only assessed L,  R swollen post op]  Posterior Calf Region Mild depletion  [only assessed L,  R swollen post op]  Edema (RD Assessment) Mild  [R lower extremity]  Hair Reviewed  Eyes Reviewed  Mouth Reviewed  Skin Reviewed  Nails Reviewed    Diet Order:   Diet Order             Diet renal with fluid restriction Fluid restriction: 1200 mL Fluid; Room service appropriate? Yes; Fluid consistency: Thin  Diet effective now                   EDUCATION NEEDS:   Education needs have been addressed  Skin:  Skin Assessment: Reviewed RN Assessment Skin Integrity Issues:: Unstageable Unstageable: Unable to access wounds in person  Last BM:  1/28 type 1  Height:   Ht Readings from Last 1 Encounters:  03/03/24 6' 2 (1.88 m)    Weight:   Wt Readings from Last 1 Encounters:  03/11/24 108.9 kg    BMI:  Body mass index is 30.82 kg/m.  Estimated Nutritional Needs:   Kcal:  2100-2400  Protein:  125-150 g  Fluid:  1.2 L    Josette Glance, MS, RDN, LDN Clinical Dietitian I Please reach out via secure chat

## 2024-03-12 DIAGNOSIS — E440 Moderate protein-calorie malnutrition: Secondary | ICD-10-CM | POA: Insufficient documentation

## 2024-03-12 LAB — CBC WITH DIFFERENTIAL/PLATELET
Abs Immature Granulocytes: 0.03 10*3/uL (ref 0.00–0.07)
Basophils Absolute: 0.1 10*3/uL (ref 0.0–0.1)
Basophils Relative: 2 %
Eosinophils Absolute: 1 10*3/uL — ABNORMAL HIGH (ref 0.0–0.5)
Eosinophils Relative: 15 %
HCT: 19.8 % — ABNORMAL LOW (ref 39.0–52.0)
Hemoglobin: 6.3 g/dL — CL (ref 13.0–17.0)
Immature Granulocytes: 1 %
Lymphocytes Relative: 12 %
Lymphs Abs: 0.7 10*3/uL (ref 0.7–4.0)
MCH: 28.4 pg (ref 26.0–34.0)
MCHC: 31.8 g/dL (ref 30.0–36.0)
MCV: 89.2 fL (ref 80.0–100.0)
Monocytes Absolute: 0.8 10*3/uL (ref 0.1–1.0)
Monocytes Relative: 13 %
Neutro Abs: 3.6 10*3/uL (ref 1.7–7.7)
Neutrophils Relative %: 57 %
Platelets: 343 10*3/uL (ref 150–400)
RBC: 2.22 MIL/uL — ABNORMAL LOW (ref 4.22–5.81)
RDW: 15.6 % — ABNORMAL HIGH (ref 11.5–15.5)
WBC: 6.2 10*3/uL (ref 4.0–10.5)
nRBC: 0 % (ref 0.0–0.2)

## 2024-03-12 LAB — RENAL FUNCTION PANEL
Albumin: 3.1 g/dL — ABNORMAL LOW (ref 3.5–5.0)
Anion gap: 11 (ref 5–15)
BUN: 34 mg/dL — ABNORMAL HIGH (ref 8–23)
CO2: 29 mmol/L (ref 22–32)
Calcium: 8.8 mg/dL — ABNORMAL LOW (ref 8.9–10.3)
Chloride: 92 mmol/L — ABNORMAL LOW (ref 98–111)
Creatinine, Ser: 4.61 mg/dL — ABNORMAL HIGH (ref 0.61–1.24)
GFR, Estimated: 13 mL/min — ABNORMAL LOW
Glucose, Bld: 125 mg/dL — ABNORMAL HIGH (ref 70–99)
Phosphorus: 2.5 mg/dL (ref 2.5–4.6)
Potassium: 3.8 mmol/L (ref 3.5–5.1)
Sodium: 132 mmol/L — ABNORMAL LOW (ref 135–145)

## 2024-03-12 LAB — GLUCOSE, CAPILLARY
Glucose-Capillary: 82 mg/dL (ref 70–99)
Glucose-Capillary: 126 mg/dL — ABNORMAL HIGH (ref 70–99)

## 2024-03-12 LAB — HEPATITIS B SURFACE ANTIBODY, QUANTITATIVE: Hep B S AB Quant (Post): 29.4 m[IU]/mL

## 2024-03-12 LAB — VANCOMYCIN, RANDOM: Vancomycin Rm: 27 ug/mL

## 2024-03-12 MED ORDER — SEVELAMER CARBONATE 800 MG PO TABS
1600.0000 mg | ORAL_TABLET | Freq: Three times a day (TID) | ORAL | Status: AC
  Administered 2024-03-12 – 2024-03-20 (×25): 1600 mg via ORAL
  Filled 2024-03-12 (×27): qty 2

## 2024-03-12 MED ORDER — CHLORHEXIDINE GLUCONATE CLOTH 2 % EX PADS
6.0000 | MEDICATED_PAD | Freq: Every day | CUTANEOUS | Status: DC
  Administered 2024-03-12 – 2024-03-14 (×2): 6 via TOPICAL

## 2024-03-12 NOTE — Plan of Care (Signed)
" °  Problem: Consults Goal: RH GENERAL PATIENT EDUCATION Description: See Patient Education module for education specifics. Outcome: Progressing Goal: Nutrition Consult-if indicated Outcome: Progressing Goal: Diabetes Guidelines if Diabetic/Glucose > 140 Description: If diabetic or lab glucose is > 140 mg/dl - Initiate Diabetes/Hyperglycemia Guidelines & Document Interventions  Outcome: Progressing   Problem: RH BOWEL ELIMINATION Goal: RH STG MANAGE BOWEL WITH ASSISTANCE Description: STG Manage Bowel with mod I Assistance. Outcome: Progressing Goal: RH STG MANAGE BOWEL W/MEDICATION W/ASSISTANCE Description: STG Manage Bowel with Medication with mod I Assistance. Outcome: Progressing   Problem: RH SAFETY Goal: RH STG ADHERE TO SAFETY PRECAUTIONS W/ASSISTANCE/DEVICE Description: STG Adhere to Safety Precautions With cues Assistance/Device. Outcome: Progressing   Problem: RH PAIN MANAGEMENT Goal: RH STG PAIN MANAGED AT OR BELOW PT'S PAIN GOAL Description: Pain < 4 with prns Outcome: Progressing   Problem: RH KNOWLEDGE DEFICIT GENERAL Goal: RH STG INCREASE KNOWLEDGE OF SELF CARE AFTER HOSPITALIZATION Description: Patient and son will be able to manage care at discharge using educational resources independently Outcome: Progressing   "

## 2024-03-12 NOTE — Progress Notes (Signed)
 " [facility] Progress Note   Subjective:   Has appetite, a little more strength in left leg but still very limited; tolerated HD with 3L UF yest without cramping. Denies SOB, CP, dizziness, nausea.   Objective Vitals:   03/11/24 1639 03/11/24 1802 03/11/24 1925 03/12/24 0617  BP: 125/81 (!) 140/61 100/67 111/65  Pulse: 99 (!) 109 99 89  Resp: 11 20 16 16   Temp: (!) 97.4 F (36.3 C) 98 F (36.7 C) 98.6 F (37 C) 98.5 F (36.9 C)  TempSrc:   Oral Oral  SpO2: 100% 100% 100% 100%  Weight: 105.9 kg   103.8 kg  Height:       Physical Exam General: Alert male in NAD Heart: RRR, no murmur Lungs: CTA bilaterally, respirations unlabored on RA Abdomen: Soft, non-distended, +BS Extremities: 1+ edema b/l lower extremities, weakness on right side Dialysis Access:  Rt Cimino + T/b  Additional Objective Labs: Basic Metabolic Panel: Recent Labs  Lab 03/09/24 1445 03/11/24 1230 03/12/24 0418  NA 130* 131* 132*  K 5.1 4.9 3.8  CL 89* 88* 92*  CO2 25 25 29   GLUCOSE 103* 88 125*  BUN 72* 62* 34*  CREATININE 7.30* 6.60* 4.61*  CALCIUM  8.8* 8.9 8.8*  PHOS 3.3 3.6 2.5   Liver Function Tests: Recent Labs  Lab 03/09/24 1445 03/11/24 1230 03/12/24 0418  ALBUMIN  3.1* 3.2* 3.1*   No results for input(s): LIPASE, AMYLASE in the last 168 hours. CBC: Recent Labs  Lab 03/07/24 1618 03/09/24 1445 03/11/24 1119 03/12/24 0418  WBC 9.5 9.3 8.9 6.2  NEUTROABS  --   --   --  3.6  HGB 6.6* 6.3* 6.4* 6.3*  HCT 20.5* 19.6* 19.7* 19.8*  MCV 89.5 88.7 88.7 89.2  PLT 422* 403* 372 343   Blood Culture    Component Value Date/Time   SDES TISSUE RIGHT SHOULDER 02/28/2024 1636   SPECREQUEST SWAB C INTRA ARTICULAR SUTURE 02/28/2024 1636   CULT  02/28/2024 1636    No growth aerobically or anaerobically. Performed at [facility], [address] [place], [name]    REPTSTATUS 03/04/2024 FINAL 02/28/2024 1636    Cardiac Enzymes: No results for  input(s): CKTOTAL, CKMB, CKMBINDEX, TROPONINI in the last 168 hours. CBG: Recent Labs  Lab 03/10/24 0722 03/10/24 1729 03/11/24 0653 03/11/24 1753 03/12/24 0618  GLUCAP 80 112* 73 78 82   Iron Studies:  Recent Labs    03/10/24 0544  IRON 27*  TIBC 221*  FERRITIN 3,173*   @lablastinr3 @ Studies/Results: No results found. Medications:  ceFEPime  (MAXIPIME ) IV Stopped (03/11/24 1712)   vancomycin  Stopped (03/11/24 1712)    (feeding supplement) PROSource Plus  30 mL Oral BID BM   aspirin  EC  81 mg Oral Daily   atorvastatin   10 mg Oral Daily   carbamide peroxide  10 drop Right EAR QPC supper   Chlorhexidine  Gluconate Cloth  6 each Topical Q0600   cinacalcet   120 mg Oral Q breakfast   clopidogrel   75 mg Oral Daily   collagenase    Topical Daily   docusate sodium   100 mg Oral BID   epoetin  alfa  20,000 Units Subcutaneous Q M,W,F-HD   ferrous sulfate   325 mg Oral Q breakfast   folic acid   1 mg Oral Daily   gabapentin   300 mg Oral QHS   heparin   5,000 Units Subcutaneous Q8H   hydrocerin   Topical BID   insulin  aspart  0-6 Units Subcutaneous BID AC  lidocaine   1 patch Transdermal Q24H   midodrine   10 mg Oral TID WC   multivitamin  1 tablet Oral QHS   nutrition supplement (JUVEN)  1 packet Oral BID BM   pantoprazole   40 mg Oral BID AC   psyllium  1 packet Oral Daily   selenium  sulfide  1 Application Topical Daily   sevelamer  carbonate  2,400 mg Oral TID WC    Dialysis Orders:  MWF NW 4h  B400  87.3kg  2K bath  AVF   Heparin  3000 ESA: Mircera 50 q 2 wks (last 12/15) BMM: Calcitriol  2 mcg PO q HD, cinacalcet  120 mg daily   Assessment/Plan: ESRD - on HD MWF -> Tolerated dialysis Monday  with 2 L net UF and appears to be very comfortable. Tolerated 3L UF Wed without cramping or any issues. He's supposed to be ~88-89kg and is currently way up. Planning on UF tomorrow and possibly extra treatment on Sat.   Volume/chronic hypotension:  - HD with midodrine  support,  UF as tolerated -cannot use albumin  due to refusal of blood products which limits our BP support options -encouraged patient to limit his fluid intake   Anemia of ESRD:  - Declines IV albumin /blood products - switched to high-dose EPO 20,000u three times per week w/ HD - can adjust more frequently as needed for this pt who is Jehovah Witness   Hyperkalemia:  - resolved   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   Otitis media w/ TM perforation:  - s/p course of abx, ENT has seen   R sided weakness:  - main c/o on admission, CVA w/u was negative   Secondary hyperparathyroidism:  - CorrCa slightly high, VDRA on hold, continue sensipar  - Phos to goal, continue sevelamer  but decr to 2 tabs TIDM 1/29   Nutrition - Continue renal diet and protein supplements   Dispo -In CIR      "

## 2024-03-12 NOTE — Progress Notes (Signed)
 Pharmacy Antibiotic Note  Carl Gorin Sr. is a 64 y.o. male admitted on 03/03/2024 with right septic knee s/p washout 1/9. Pharmacy consulted for vancomycin  dosing. Patient is ESRD on HD MWF.  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 An informational order will be placed for discharge.   Plan: Cefepime  2 gm with HD MWF Continue Vancomycin  1000 mg every MWF with HD  Monitor dialysis sessions/tolerance ID planning 6 weeks of therapy (Last doses 04/10/24)   Height: 6' 2 (188 cm) Weight: 103.8 kg (228 lb 13.4 oz) IBW/kg (Calculated) : 82.2  Temp (24hrs), Avg:98.1 F (36.7 C), Min:97.4 F (36.3 C), Max:98.6 F (37 C)  Recent Labs  Lab 03/07/24 1618 03/09/24 1445 03/11/24 1119 03/11/24 1230 03/12/24 0418  WBC 9.5 9.3 8.9  --  6.2  CREATININE 8.89* 7.30*  --  6.60* 4.61*  VANCORANDOM  --   --   --   --  27    Estimated Creatinine Clearance: 20.5 mL/min (A) (by C-G formula based on SCr of 4.61 mg/dL (H)).    Thank you for allowing pharmacy to be a part of this patients care.   [name], PharmD 03/12/2024 9:31 AM  **Pharmacist phone directory can be found on [email or web address] listed under [facility]**

## 2024-03-12 NOTE — Progress Notes (Signed)
 Occupational Therapy Session Note  Patient Details  Name: Carl Coate Sr. MRN: [id number] Date of Birth: [birth year]  Today's Date: 03/12/2024 OT Individual Time: 1200-1300 OT Individual Time Calculation (min): 60 min    Short Term Goals: Week 1:  OT Short Term Goal 1 (Week 1): Pt will increase ROM of Rt shoulder by 10* OT Short Term Goal 2 (Week 1): pt will complete sit to stand at Mod A using LRAD OT Short Term Goal 3 (Week 1): Pt will stand with Mod A for 1 min< with LRAD in prep for ADLs  Skilled Therapeutic Interventions/Progress Updates:      Therapy Documentation Precautions:  Precautions Precautions: Fall Precaution/Restrictions Comments: watch HR, R knee buckles, spinal Restrictions Weight Bearing Restrictions Per Provider Order: Yes RUE Weight Bearing Per Provider Order: Weight bearing as tolerated RLE Weight Bearing Per Provider Order: Weight bearing as tolerated Other Position/Activity Restrictions: Per Dr. [name] on 1/19 pt can perform ROM and WB through RUE as tolerated General:  Pt seated in W/C upon OT arrival, agreeable to OT.  Pain:  7/10 pain reported in RLE, activity, intermittent rest breaks, distractions provided for pain management, pt reports tolerable to proceed.   Exercises: Pt completed the following exercise circuit in order to improve functional activity, strength and endurance to prepare for ADLs such as bathing. Pt completed the following exercises in seated position with no noted LOB/SOB and 3x10 repetitions on each exercise: -towel slides anterior/posterior -towel slides clockwise/counterclockwise  -towel slides with RLE, same exercises  Pt seated in W/C at end of session with W/C alarm donned, call light within reach and 4Ps assessed.   Therapy/Group: Individual Therapy  Camie Hoe, OTD, OTR/L 03/12/2024, 3:54 PM

## 2024-03-12 NOTE — Progress Notes (Signed)
 Occupational Therapy Session Note  Patient Details  Name: Carl Swiderski Sr. MRN: [id number] Date of Birth: 21-Jan-1961  Today's Date: 03/12/2024 OT Individual Time: 0700-0810 OT Individual Time Calculation (min): 70 min    Short Term Goals: Week 1:  OT Short Term Goal 1 (Week 1): Pt will increase ROM of Rt shoulder by 10* OT Short Term Goal 2 (Week 1): pt will complete sit to stand at Mod A using LRAD OT Short Term Goal 3 (Week 1): Pt will stand with Mod A for 1 min< with LRAD in prep for ADLs  Skilled Therapeutic Interventions/Progress Updates:    Pt seated EOB upon arrival. Skilled OT intervention with focus on sit<>stand, standing balance, stand step transfers, and BUE strengthening to increase independence with bADLs. Sit<>stand from elevated EOB with mod A. Standing balance with min A. Transfer to [facility] with min A and extra time to advance BLE. Sit>stand with min A. Pt with uncontrolled descent. OTA assisted (tot A) with donning shoes. PWC mobility with supervision in hallway and simulated community envirionment. UBE 1x10 mins and 1x5 mins level 5 in opposite directions. Rest break between set. Continued with home safety education and discharge planning. Pt returned to room and remained in [facility] with all needs within reach.   Therapy Documentation Precautions:  Precautions Precautions: Fall Precaution/Restrictions Comments: watch HR, R knee buckles, spinal Restrictions Weight Bearing Restrictions Per Provider Order: Yes RUE Weight Bearing Per Provider Order: Weight bearing as tolerated RLE Weight Bearing Per Provider Order: Weight bearing as tolerated Other Position/Activity Restrictions: Per Dr. [name] on 1/19 pt can perform ROM and WB through RUE as tolerated Pain:  Pt denies pain this morning   Therapy/Group: Individual Therapy  Maritza Debby Mare 03/12/2024, 8:17 AM

## 2024-03-12 NOTE — Progress Notes (Signed)
 Physical Therapy Weekly Progress Note  Patient Details  Name: Carl Rufo Sr. MRN: [id number] Date of Birth: 1961/01/10  Beginning of progress report period: March 04, 2024 End of progress report period: March 12, 2024  Today's Date: 03/12/2024 PT Individual Time: 0915-1015 PT Individual Time Calculation (min): 60 min   Patient has met 3 of 3 short term goals.  [name] is progressing with his mobility, now able to stand to RW with as little as mod a from an elevated surface. He has initiated Stand pivot transfer training with RW. His biggest barrier is RLE pain and weakness which limits single leg stance for transfers and gait, as well difficulty standing from lower surfaces.   Patient continues to demonstrate the following deficits muscle weakness, impaired timing and sequencing and unbalanced muscle activation, and decreased standing balance, decreased postural control, and decreased balance strategies and therefore will continue to benefit from skilled PT intervention to increase functional independence with mobility.  Patient progressing toward long term goals..  Continue plan of care.  PT Short Term Goals Week 1:  PT Short Term Goal 1 (Week 1): pt will perform sit to stand with RW and max A PT Short Term Goal 1 - Progress (Week 1): Met PT Short Term Goal 2 (Week 1): pt will perform bed to chair transfer with LRAD and max A PT Short Term Goal 2 - Progress (Week 1): Met PT Short Term Goal 3 (Week 1): Pt will tolerate standing upright for ~ 2 min PT Short Term Goal 3 - Progress (Week 1): Met Week 2:  PT Short Term Goal 1 (Week 2): pt will initiate gait training PT Short Term Goal 2 (Week 2): Pt will perform STS from 24 surface with assist PT Short Term Goal 3 (Week 2): Pt will tolerate standing>5 min  Skilled Therapeutic Interventions/Progress Updates:    Pt missed x 15 min at start of session d/t being on phone and then finishing breakfast. Will make up missed minutes as  able. Pt received in PWC, agreeable to therapy. Pt reports unrated knee pain, premedicated. Rest and positioning provided as needed. Therapist donned ace wrap for edema management. Pt navigated PWC at mod I level throughout session.   Pt participated in Sit to stand to RW with min-mod a pending PWC height. Cues for upright posture and knee extension. Pt performed squats and marching/weight shift in this manner to promote transfer independence and prepare to progress to ambulation. Pt returned to room and remained in PWC, needs in reach.   Therapy Documentation Precautions:  Precautions Precautions: Fall Precaution/Restrictions Comments: watch HR, R knee buckles, spinal Restrictions Weight Bearing Restrictions Per Provider Order: Yes RUE Weight Bearing Per Provider Order: Weight bearing as tolerated RLE Weight Bearing Per Provider Order: Weight bearing as tolerated Other Position/Activity Restrictions: Per Dr. [name] on 1/19 pt can perform ROM and WB through RUE as tolerated General: PT Amount of Missed Time (min): 15 Minutes PT Missed Treatment Reason: Other (Comment) (breakfast, pt on phone)    Therapy/Group: Individual Therapy  [name] 03/12/2024, 4:31 PM

## 2024-03-12 NOTE — Progress Notes (Signed)
 "                                                        PROGRESS NOTE   Subjective/Complaints:   Pt reports did heparin  shot again starting this AM- bled less than last one.  Peaceful night last night- first in 3 nights. LBM yesterday.  Wants to know labs  ROS:  Per HPI   Pt denies SOB, abd pain, CP, N/V/C/D, and vision changes    Objective:   No results found. Recent Labs    03/11/24 1119 03/12/24 0418  WBC 8.9 6.2  HGB 6.4* 6.3*  HCT 19.7* 19.8*  PLT 372 343   Recent Labs    03/11/24 1230 03/12/24 0418  NA 131* 132*  K 4.9 3.8  CL 88* 92*  CO2 25 29  GLUCOSE 88 125*  BUN 62* 34*  CREATININE 6.60* 4.61*  CALCIUM  8.9 8.8*    Intake/Output Summary (Last 24 hours) at 03/12/2024 0954 Last data filed at 03/11/2024 1928 Gross per 24 hour  Intake 240 ml  Output 3000 ml  Net -2760 ml     Wound 03/03/24 0330 Pressure Injury Sacrum Left Unstageable - Full thickness tissue loss in which the base of the injury is covered by slough (yellow, tan, gray, green or brown) and/or eschar (tan, brown or black) in the wound bed. (Active)    Physical Exam: Vital Signs Blood pressure 111/65, pulse 89, temperature 98.5 F (36.9 C), temperature source Oral, resp. rate 16, height 6' 2 (1.88 m), weight 103.8 kg, SpO2 100%.       General: awake, alert, appropriate, up in power w/c; in small gym with OT;  NAD HENT: conjugate gaze; oropharynx dry CV: regular rate and rhythm; no JVD Pulmonary: CTA B/L; no W/R/R- good air movement GI: soft, NT, ND, (+)BS- normoactive Psychiatric: appropriate Neurological: Ox3   Extremities: R knee is 3+ edema- but incision healing- same today  Skin: scalp with dry skin, denuded area where he's scratched. Slightly larger than dollar sized coin- Healing surgical wounds on right shoulder and knee c/d/I, sutures intact at R shoulder wounds. Looks good Sacral wound as below-- not reassessed  Neuro: A&O x3, follows commands, normal speech  and migraines, cranial nerves II to XII grossly intact  PRIOR EXAMS: Neuro:  Alert and oriented x 3. Normal insight and awareness. Intact Memory. Normal language and speech. Cranial nerve exam unremarkable. MMT: LUE 5/5/ RUE limited proximally due to pain but 5/5 distally. RLE 2/5 HF,KE pain, 4+ ankle. LLE 4/5 prox to 5/5 distal. Decreased LT in feet.   Musculoskeletal: right knee with swelling,  tender. Right shoulder  tender.     Assessment/Plan: 1. Functional deficits which require 3+ hours per day of interdisciplinary therapy in a comprehensive inpatient rehab setting. Physiatrist is providing close team supervision and 24 hour management of active medical problems listed below. Physiatrist and rehab team continue to assess barriers to discharge/monitor patient progress toward functional and medical goals  Care Tool:  Bathing              Bathing assist Assist Level: Set up assist     Upper Body Dressing/Undressing Upper body dressing        Upper body assist Assist Level: Set up assist    Lower Body Dressing/Undressing Lower body dressing  Lower body assist Assist for lower body dressing: Moderate Assistance - Patient 50 - 74%     Toileting Toileting    Toileting assist Assist for toileting: 2 Helpers     Transfers Chair/bed transfer  Transfers assist     Chair/bed transfer assist level: Contact Guard/Touching assist     Locomotion Ambulation   Ambulation assist   Ambulation activity did not occur: Safety/medical concerns          Walk 10 feet activity   Assist  Walk 10 feet activity did not occur: Safety/medical concerns        Walk 50 feet activity   Assist Walk 50 feet with 2 turns activity did not occur: Safety/medical concerns         Walk 150 feet activity   Assist Walk 150 feet activity did not occur: Safety/medical concerns         Walk 10 feet on uneven surface  activity   Assist Walk 10 feet on  uneven surfaces activity did not occur: Safety/medical concerns         Wheelchair     Assist Is the patient using a wheelchair?: Yes Type of Wheelchair: Power    Wheelchair assist level: Independent Max wheelchair distance: 150'    Wheelchair 50 feet with 2 turns activity    Assist        Assist Level: Independent   Wheelchair 150 feet activity     Assist      Assist Level: Independent   Blood pressure 111/65, pulse 89, temperature 98.5 F (36.9 C), temperature source Oral, resp. rate 16, height 6' 2 (1.88 m), weight 103.8 kg, SpO2 100%.  Medical Problem List and Plan: 1. Functional deficits secondary to septic right knee and shoulder             -patient may shower if incisons are covered             -ELOS/Goals: 15-20 days, min assist goals with PT and OT, W/C level?  D/c date 2/12  Con't CIR PT and OT  Using power w/c currently- will probably need specialty w/c, but will double check  Will remove sutures  tomorrow AM 2.  Antithrombotics:  -DVT/anticoagulation:  Pharmaceutical: Heparin  5kU q8h due to ESRD 1/28- advised pt to take his Heparin  since Plavix  doesn't cover DVT prophylaxis             -antiplatelet therapy: ASA/Plavix  daily  3. Pain Management: tylenol  prn, oxycodone  prn. Lidoderm  patches.  -pt will take oxycodone  prior to therapies based on his schedule. I won't schedule it  for now.   1/26- 1/27- Pain meds prn working for him- con't regimen 4. Mood/Behavior/Sleep:  LCSW to follow for evaluation and support.              -antipsychotic agents: N/A  -Melatonin PRN  5. Neuropsych/cognition: This patient is capable of making decisions on his own behalf.  6. Skin/Wound Care: surgical incisions CDI, healing  -air mattress for sacral wound, WOC. Santyl  to area of necrotic tissue  -have discussed importance of nutrition with pt  -medicated shampoo for scalp was helpful -03/07/24 pt inquiring about sutures R shoulder-- looks like surgery  was 1/16, weekday team to touch base with ortho regarding timing of removal but likely this coming week.   1/26- can likely remove sutures 1/30  1/28- Pt on regular bed- didn't like air mattress 7. Fluids/Electrolytes/Nutrition: Strict I/O. Renal diet w/1200 cc/FR. Continue vitamins/supplements. Continue Protonix  40mg  BID  -  labs reviewed, stable  1/26- labs pending 8.  Septic arthritis R-knee/R-shoulder: To continue Cefepime  and Vanc with HD thru 04/10/24             --shoulder to be aspirated in 8 weeks per Dr. [name]?  9.  Chronic hypotension: On midodrine  10mg  TID with prn doses. Stable   -1/25 BP stable overall continue current regimen and monitor  1/26- 1/27- BP slightly soft, but denies symptoms  1/28- running a little soft, but overall, not symptomatic in chart that I can find/per pt Vitals:   03/11/24 1330 03/11/24 1400 03/11/24 1430 03/11/24 1500  BP: 132/82 128/78 117/70 130/84   03/11/24 1530 03/11/24 1600 03/11/24 1626 03/11/24 1636  BP: 135/79 134/76 127/77 125/77   03/11/24 1639 03/11/24 1802 03/11/24 1925 03/12/24 0617  BP: 125/81 (!) 140/61 100/67 111/65     10. ESRD: HD MWF continue at the end of the day to help with tolerance of therapy. Continue Cinacalcet  and Sevelamer              --chronic hyponatremia managed with HD/FR  -have requested a piece of bacon or sausage on breakfast plate  -8/75/73 dialysis delayed to today (Sat) d/t census  -1/25 reviewed nephrology note, next HD tomorrow.  1/26- Na last 129- labs pending- HD today  1/27- Na 127- 1/28- labs tomorrow  1/29- Na 132 11. T2DM: A1C- 5.1. Will continue to monitor BS ac/hs and if controlled decrease to twice a day  1/26- will reduce CBGs and SSI to BID-AC  1/28- look great- cont' regimen CBG (last 3)  Recent Labs    03/11/24 0653 03/11/24 1753 03/12/24 0618  GLUCAP 73 78 82     12. Anemia of chronic disease: Jehovah's witness and refuses blood transfusion. Hgb ~8, continue Aranesp  and iron  supplement and folic acid   -1/25 hemoglobin yesterday down to 6.6, he denies any bleeding.  Will check occult stool test.  Nephrology planning for high-dose EPO.  Nephrology ordered iron studies today and considering IV iron.  Patient declined blood transfusion, reports he understands risks.  1/26- labs pending- iron studies today as well   1/27- Given Aranesp  and Iron in HD yesterday along with IV ABX- Hb was 6.3! Will recheck Thursday- but not daily, or will drain him more.   1/28- pt thought had labs in HD yesterday- did not, so did go over the Hb of 6.3 with him. On Aranesp  and PO/IV iron  1/29- Hb was 6.4 yesterday afternoon; 6.3 today! Went back and let pt know 13. L suppurative Otitis media with TM perforation: Has completed antibiotic course.  --Follow up with [name] for evaluation. -Getting debrox for R ear cerumen impaction, DO NOT USE IN LEFT EAR  14. Cervical myelopathy: Gabapentin  300mg  nightly for neuropathic pain  15. PAD s/p R-SFA/popliteal lithotripsy/Stent: On ASA, Plavix  and Lipitor 10mg  daily    16. Constipation: continue Colace 100mg  BID, psyllium packet daily  -1/25 LBM today  1/26- trying to have BM today as well- feeling constipated  1/27- LBM yesterday AM- feels like more on daily schedule now  1/29- LBM yesterday   I spent a total of 43   minutes on total care today- >50% coordination of care- due to  Pt seen twice to go over his Hb- also review of Labs, vitals and B/B and d/w nursing and OT LOS: 9 days A FACE TO FACE EVALUATION WAS PERFORMED  [name] 03/12/2024, 9:54 AM     "

## 2024-03-13 LAB — RENAL FUNCTION PANEL
Albumin: 3.3 g/dL — ABNORMAL LOW (ref 3.5–5.0)
Anion gap: 14 (ref 5–15)
BUN: 50 mg/dL — ABNORMAL HIGH (ref 8–23)
CO2: 27 mmol/L (ref 22–32)
Calcium: 9 mg/dL (ref 8.9–10.3)
Chloride: 92 mmol/L — ABNORMAL LOW (ref 98–111)
Creatinine, Ser: 6.69 mg/dL — ABNORMAL HIGH (ref 0.61–1.24)
GFR, Estimated: 9 mL/min — ABNORMAL LOW
Glucose, Bld: 116 mg/dL — ABNORMAL HIGH (ref 70–99)
Phosphorus: 3.3 mg/dL (ref 2.5–4.6)
Potassium: 4 mmol/L (ref 3.5–5.1)
Sodium: 133 mmol/L — ABNORMAL LOW (ref 135–145)

## 2024-03-13 LAB — CBC
HCT: 19.8 % — ABNORMAL LOW (ref 39.0–52.0)
Hemoglobin: 6.3 g/dL — CL (ref 13.0–17.0)
MCH: 28.6 pg (ref 26.0–34.0)
MCHC: 31.8 g/dL (ref 30.0–36.0)
MCV: 90 fL (ref 80.0–100.0)
Platelets: 399 10*3/uL (ref 150–400)
RBC: 2.2 MIL/uL — ABNORMAL LOW (ref 4.22–5.81)
RDW: 15.7 % — ABNORMAL HIGH (ref 11.5–15.5)
WBC: 9.3 10*3/uL (ref 4.0–10.5)
nRBC: 0 % (ref 0.0–0.2)

## 2024-03-13 LAB — GLUCOSE, CAPILLARY
Glucose-Capillary: 84 mg/dL (ref 70–99)
Glucose-Capillary: 79 mg/dL (ref 70–99)

## 2024-03-13 MED ORDER — HEPARIN SODIUM (PORCINE) 1000 UNIT/ML IJ SOLN
INTRAMUSCULAR | Status: AC
  Filled 2024-03-13: qty 3

## 2024-03-13 MED ORDER — VANCOMYCIN HCL IN DEXTROSE 1-5 GM/200ML-% IV SOLN
INTRAVENOUS | Status: AC
  Filled 2024-03-13: qty 200

## 2024-03-13 MED ORDER — HEPARIN SODIUM (PORCINE) 1000 UNIT/ML DIALYSIS
3000.0000 [IU] | Freq: Once | INTRAMUSCULAR | Status: AC
  Administered 2024-03-13: 3000 [IU] via INTRAVENOUS_CENTRAL

## 2024-03-13 NOTE — Progress Notes (Signed)
 Occupational Therapy Session Note  Patient Details  Name: Carl Shugart Sr. MRN: [id number] Date of Birth: 1960-07-16  Today's Date: 03/13/2024 OT Individual Time: 9299-9187 OT Individual Time Calculation (min): 72 min    Short Term Goals: Week 1:  OT Short Term Goal 1 (Week 1): Pt will increase ROM of Rt shoulder by 10* OT Short Term Goal 2 (Week 1): pt will complete sit to stand at Mod A using LRAD OT Short Term Goal 3 (Week 1): Pt will stand with Mod A for 1 min< with LRAD in prep for ADLs  Skilled Therapeutic Interventions/Progress Updates:    Pt seated EOB upon arrival. Skilled OT intervention with focus on sit<>stand, functional transfers, PWC mobility, and BUE therex to increase pt's independence with BADLs. Sit<>stand from elevated EOB with min A. Stand step transfer with CGA. Grooming seated in PWC with supervision. BUE therex: biceps with 4# bar bell 3x15, triceps with 3# bar bell 3x15, chest presses with 3# bar bell 3x10. Pt returned to room and remained in [facility] with all needs within reach.   Therapy Documentation Precautions:  Precautions Precautions: Fall Precaution/Restrictions Comments: watch HR, R knee buckles, spinal Restrictions Weight Bearing Restrictions Per Provider Order: Yes RUE Weight Bearing Per Provider Order: Weight bearing as tolerated RLE Weight Bearing Per Provider Order: Weight bearing as tolerated Other Position/Activity Restrictions: Per Dr. [name] on 1/19 pt can perform ROM and WB through RUE as tolerated   Pain: Pt c/o 5/10 Lt knee pain; meds admin during session   Therapy/Group: Individual Therapy  [name] 03/13/2024, 8:16 AM

## 2024-03-13 NOTE — Progress Notes (Signed)
 Physical Therapy Session Note  Patient Details  Name: Carl Down Sr. MRN: [id number] Date of Birth: 1960/09/10  Today's Date: 03/13/2024 PT Individual Time: 1050-1200 PT Individual Time Calculation (min): 70 min   Short Term Goals: Week 2:  PT Short Term Goal 1 (Week 2): pt will initiate gait training PT Short Term Goal 2 (Week 2): Pt will perform STS from 24 surface with assist PT Short Term Goal 3 (Week 2): Pt will tolerate standing>5 min  Skilled Therapeutic Interventions/Progress Updates:    Pt received in PWC, agreeable to therapy. Pt reports unrated knee pain, declines requesting pain medication. Rest and positioning as needed. Pt navigated PWC at mod I level.   Attempted standing transfer with RW, but pt too fatigued and knee pain preventing attempt at this time. Pt then participated in various activities in standing frame to promote weight bearing TKE, upright posture, and balance with decreased UE support on table (UUE or no UE support). Rest breaks as needed for pain. Transitioned to seated towel slides with RLE for gentle knee mobility to reduce pain.   Pt requesting nsg assist to get to bathroom, pt remained in [facility], nsg staff made aware of request and pt remained in chair to await assistance.   Therapy Documentation Precautions:  Precautions Precautions: Fall Precaution/Restrictions Comments: watch HR, R knee buckles, spinal Restrictions Weight Bearing Restrictions Per Provider Order: Yes RUE Weight Bearing Per Provider Order: Weight bearing as tolerated RLE Weight Bearing Per Provider Order: Weight bearing as tolerated Other Position/Activity Restrictions: Per Dr. [name] on 1/19 pt can perform ROM and WB through RUE as tolerated General:      Therapy/Group: Individual Therapy  [name] 03/13/2024, 11:20 AM

## 2024-03-13 NOTE — Progress Notes (Signed)
 Physical Therapy Session Note  Patient Details  Name: Carl Tarnow Sr. MRN: [id number] Date of Birth: 1960-05-07  Today's Date: 03/13/2024 PT Individual Time: 0900-1000 PT Individual Time Calculation (min): 60 min   Short Term Goals: Week 2:  PT Short Term Goal 1 (Week 2): pt will initiate gait training PT Short Term Goal 2 (Week 2): Pt will perform STS from 24 surface with assist PT Short Term Goal 3 (Week 2): Pt will tolerate standing>5 min  Skilled Therapeutic Interventions/Progress Updates: Pt presented in PWC agreeable to therapy. Pt c/o pain in knee, unrated and premedicated. Pt navigated to ortho gym and participated in UBE rolling hills L 2-5 5 min forward 5 min backwards for general conditioning and UE/parascapular strengthening. Pt then navigated to main gym and worked on Sit to stand from elevated chair surface. Pt requiring consistent CGA for anterior weight shifting. Pt also worked on standing tolerance and weight bearing acceptance on RLE. Pt c/o increased pain in knee instability when trying to weight shift to R. PTA donned ace bandage on R knee for improved support with pt stating minimal relief. During seated rest breaks pt worked on [employer] within avail range and hamstring pulls with washcloth on ground for knee ROM. Pt also tolerated manually resisted leg press x 10 with pt demonstrating limited tolerance to achieve full extension. Pt navigated back to room at end of session and remained in PWC with call bell within reach and needs met.      Therapy Documentation Precautions:  Precautions Precautions: Fall Precaution/Restrictions Comments: watch HR, R knee buckles, spinal Restrictions Weight Bearing Restrictions Per Provider Order: Yes RUE Weight Bearing Per Provider Order: Weight bearing as tolerated RLE Weight Bearing Per Provider Order: Weight bearing as tolerated Other Position/Activity Restrictions: Per Dr. [name] on 1/19 pt can perform ROM and WB through RUE as  tolerated    Therapy/Group: Individual Therapy  Waneta Fitting 03/13/2024, 3:07 PM

## 2024-03-13 NOTE — Progress Notes (Signed)
 "                                                        PROGRESS NOTE   Subjective/Complaints:   Pt reports having issues getting his bacon- I explained it will always be difficult because not on Renal diet.   Doing well otherwise- denies OH Sx's- no lightheadedness or dizziness.    ROS:  Per HPI    Pt denies SOB, abd pain, CP, N/V/C/D, and vision changes   Objective:   No results found. Recent Labs    03/11/24 1119 03/12/24 0418  WBC 8.9 6.2  HGB 6.4* 6.3*  HCT 19.7* 19.8*  PLT 372 343   Recent Labs    03/11/24 1230 03/12/24 0418  NA 131* 132*  K 4.9 3.8  CL 88* 92*  CO2 25 29  GLUCOSE 88 125*  BUN 62* 34*  CREATININE 6.60* 4.61*  CALCIUM  8.9 8.8*    Intake/Output Summary (Last 24 hours) at 03/13/2024 9090 Last data filed at 03/13/2024 0900 Gross per 24 hour  Intake 118 ml  Output --  Net 118 ml     Wound 03/03/24 0330 Pressure Injury Sacrum Left Unstageable - Full thickness tissue loss in which the base of the injury is covered by slough (yellow, tan, gray, green or brown) and/or eschar (tan, brown or black) in the wound bed. (Active)    Physical Exam: Vital Signs Blood pressure 115/68, pulse 92, temperature 98.4 F (36.9 C), temperature source Oral, resp. rate 18, height 6' 2 (1.88 m), weight 103.8 kg, SpO2 99%.        General: awake, alert, appropriate, sitting up in w/c; with OT in room; NAD HENT: conjugate gaze; oropharynx dry CV: RRR Pulmonary: CTA B/L; no W/R/R- good air movement GI: soft, NT, ND, (+)BS- normoactive Psychiatric: appropriate- bright affect Neurological: Ox3   Extremities: R knee is 3+ edema- but incision healing- same today  Skin: scalp with dry skin, denuded area where he's scratched. Slightly larger than dollar sized coin- Healing surgical wounds on right shoulder and knee c/d/I, sutures intact at R shoulder wounds. Looks good Sacral wound as below-- not reassessed  Neuro: A&O x3, follows commands, normal  speech and migraines, cranial nerves II to XII grossly intact  PRIOR EXAMS: Neuro:  Alert and oriented x 3. Normal insight and awareness. Intact Memory. Normal language and speech. Cranial nerve exam unremarkable. MMT: LUE 5/5/ RUE limited proximally due to pain but 5/5 distally. RLE 2/5 HF,KE pain, 4+ ankle. LLE 4/5 prox to 5/5 distal. Decreased LT in feet.   Musculoskeletal: right knee with swelling,  tender. Right shoulder  tender.     Assessment/Plan: 1. Functional deficits which require 3+ hours per day of interdisciplinary therapy in a comprehensive inpatient rehab setting. Physiatrist is providing close team supervision and 24 hour management of active medical problems listed below. Physiatrist and rehab team continue to assess barriers to discharge/monitor patient progress toward functional and medical goals  Care Tool:  Bathing              Bathing assist Assist Level: Set up assist     Upper Body Dressing/Undressing Upper body dressing        Upper body assist Assist Level: Set up assist    Lower Body Dressing/Undressing Lower body dressing  Lower body assist Assist for lower body dressing: Moderate Assistance - Patient 50 - 74%     Toileting Toileting    Toileting assist Assist for toileting: 2 Helpers     Transfers Chair/bed transfer  Transfers assist     Chair/bed transfer assist level: Contact Guard/Touching assist     Locomotion Ambulation   Ambulation assist   Ambulation activity did not occur: Safety/medical concerns          Walk 10 feet activity   Assist  Walk 10 feet activity did not occur: Safety/medical concerns        Walk 50 feet activity   Assist Walk 50 feet with 2 turns activity did not occur: Safety/medical concerns         Walk 150 feet activity   Assist Walk 150 feet activity did not occur: Safety/medical concerns         Walk 10 feet on uneven surface  activity   Assist Walk 10  feet on uneven surfaces activity did not occur: Safety/medical concerns         Wheelchair     Assist Is the patient using a wheelchair?: Yes Type of Wheelchair: Power    Wheelchair assist level: Independent Max wheelchair distance: 150'    Wheelchair 50 feet with 2 turns activity    Assist        Assist Level: Independent   Wheelchair 150 feet activity     Assist      Assist Level: Independent   Blood pressure 115/68, pulse 92, temperature 98.4 F (36.9 C), temperature source Oral, resp. rate 18, height 6' 2 (1.88 m), weight 103.8 kg, SpO2 99%.  Medical Problem List and Plan: 1. Functional deficits secondary to septic right knee and shoulder             -patient may shower if incisons are covered             -ELOS/Goals: 15-20 days, min assist goals with PT and OT, W/C level?  D/c date 2/12  Using power w/c currently- will probably need specialty w/c, but will double check  Con't CIR PT and OT- sutures out today of R shoulder 2.  Antithrombotics:  -DVT/anticoagulation:  Pharmaceutical: Heparin  5kU q8h due to ESRD 1/28- advised pt to take his Heparin  since Plavix  doesn't cover DVT prophylaxis             -antiplatelet therapy: ASA/Plavix  daily  3. Pain Management: tylenol  prn, oxycodone  prn. Lidoderm  patches.  -pt will take oxycodone  prior to therapies based on his schedule. I won't schedule it  for now.   1/26- 1/27- Pain meds prn working for him- con't regimen  1/30- pain controlled majority of time- con't regimen 4. Mood/Behavior/Sleep:  LCSW to follow for evaluation and support.              -antipsychotic agents: N/A  -Melatonin PRN  5. Neuropsych/cognition: This patient is capable of making decisions on his own behalf.  6. Skin/Wound Care: surgical incisions CDI, healing  -air mattress for sacral wound, WOC. Santyl  to area of necrotic tissue  -have discussed importance of nutrition with pt  -medicated shampoo for scalp was helpful -03/07/24  pt inquiring about sutures R shoulder-- looks like surgery was 1/16, weekday team to touch base with ortho regarding timing of removal but likely this coming week.   1/26- can likely remove sutures 1/30  1/28- Pt on regular bed- didn't like air mattress  1/30 sutures out today 7. Fluids/Electrolytes/Nutrition:  Strict I/O. Renal diet w/1200 cc/FR. Continue vitamins/supplements. Continue Protonix  40mg  BID  -labs reviewed, stable 8.  Septic arthritis R-knee/R-shoulder: To continue Cefepime  and Vanc with HD thru 04/10/24             --shoulder to be aspirated in 8 weeks per Dr. [name]?  9.  Chronic hypotension: On midodrine  10mg  TID with prn doses. Stable   -1/25 BP stable overall continue current regimen and monitor  1/26- 1/27- BP slightly soft, but denies symptoms  1/28- running a little soft, but overall, not symptomatic in chart that I can find/per pt  1/30- denies OH Sx's Vitals:   03/11/24 1500 03/11/24 1530 03/11/24 1600 03/11/24 1626  BP: 130/84 135/79 134/76 127/77   03/11/24 1636 03/11/24 1639 03/11/24 1802 03/11/24 1925  BP: 125/77 125/81 (!) 140/61 100/67   03/12/24 0617 03/12/24 1540 03/12/24 1943 03/13/24 0552  BP: 111/65 127/69 129/78 115/68     10. ESRD: HD MWF continue at the end of the day to help with tolerance of therapy. Continue Cinacalcet  and Sevelamer              --chronic hyponatremia managed with HD/FR  -have requested a piece of bacon or sausage on breakfast plate  -8/75/73 dialysis delayed to today (Sat) d/t census  -1/25 reviewed nephrology note, next HD tomorrow.  1/30- getting HD today per his schedule 11. T2DM: A1C- 5.1. Will continue to monitor BS ac/hs and if controlled decrease to twice a day  1/26- will reduce CBGs and SSI to BID-AC  1/30- look great- cont' regimen CBG (last 3)  Recent Labs    03/12/24 0618 03/12/24 1709 03/13/24 0556  GLUCAP 82 126* 84     12. Anemia of chronic disease: Jehovah's witness and refuses blood transfusion. Hgb ~8,  continue Aranesp  and iron supplement and folic acid   -1/25 hemoglobin yesterday down to 6.6, he denies any bleeding.  Will check occult stool test.  Nephrology planning for high-dose EPO.  Nephrology ordered iron studies today and considering IV iron.  Patient declined blood transfusion, reports he understands risks.  1/26- labs pending- iron studies today as well   1/27- Given Aranesp  and Iron in HD yesterday along with IV ABX- Hb was 6.3! Will recheck Thursday- but not daily, or will drain him more.   1/28- pt thought had labs in HD yesterday- did not, so did go over the Hb of 6.3 with him. On Aranesp  and PO/IV iron  1/29- Hb was 6.4 yesterday afternoon; 6.3 today! Went back and let pt know  1/30- Will likely have Renal check his Hb- I have scheduled for Monday 13. L suppurative Otitis media with TM perforation: Has completed antibiotic course.  --Follow up with [name] for evaluation. -Getting debrox for R ear cerumen impaction, DO NOT USE IN LEFT EAR  14. Cervical myelopathy: Gabapentin  300mg  nightly for neuropathic pain  15. PAD s/p R-SFA/popliteal lithotripsy/Stent: On ASA, Plavix  and Lipitor 10mg  daily    16. Constipation: continue Colace 100mg  BID, psyllium packet daily  -1/25 LBM today  1/26- trying to have BM today as well- feeling constipated  1/27- LBM yesterday AM- feels like more on daily schedule now  1/29- LBM yesterday   I spent a total of  45  minutes on total care today- >50% coordination of care- due to  D/w pt about his bacon as well as labs, vitals and OH sx's- also d/w OT Also d/w nursing.    LOS: 10 days A FACE TO FACE  EVALUATION WAS PERFORMED  [name] 03/13/2024, 9:09 AM     "

## 2024-03-14 DIAGNOSIS — Z794 Long term (current) use of insulin: Secondary | ICD-10-CM

## 2024-03-14 DIAGNOSIS — K5901 Slow transit constipation: Secondary | ICD-10-CM

## 2024-03-14 DIAGNOSIS — E1169 Type 2 diabetes mellitus with other specified complication: Secondary | ICD-10-CM

## 2024-03-14 LAB — GLUCOSE, CAPILLARY
Glucose-Capillary: 94 mg/dL (ref 70–99)
Glucose-Capillary: 91 mg/dL (ref 70–99)

## 2024-03-14 NOTE — Progress Notes (Signed)
 "                                                        PROGRESS NOTE   Subjective/Complaints:   Pt doing well, slept ok-enough. Pain manageable. LBM yesterday but not documented. Dialysis yesterday went ok. States his sutures were supposed to come out yesterday, ordered again and nursing aware. No other complaints or concerns.    ROS:  Per HPI    Pt denies SOB, abd pain, CP, N/V/C/D, and vision changes   Objective:   No results found. Recent Labs    03/12/24 0418 03/13/24 1430  WBC 6.2 9.3  HGB 6.3* 6.3*  HCT 19.8* 19.8*  PLT 343 399   Recent Labs    03/12/24 0418 03/13/24 1430  NA 132* 133*  K 3.8 4.0  CL 92* 92*  CO2 29 27  GLUCOSE 125* 116*  BUN 34* 50*  CREATININE 4.61* 6.69*  CALCIUM  8.8* 9.0    Intake/Output Summary (Last 24 hours) at 03/14/2024 1101 Last data filed at 03/14/2024 9160 Gross per 24 hour  Intake 238 ml  Output 3000 ml  Net -2762 ml     Wound 03/03/24 0330 Pressure Injury Sacrum Left Unstageable - Full thickness tissue loss in which the base of the injury is covered by slough (yellow, tan, gray, green or brown) and/or eschar (tan, brown or black) in the wound bed. (Active)    Physical Exam: Vital Signs Blood pressure 104/66, pulse 84, temperature 98.3 F (36.8 C), temperature source Oral, resp. rate 15, height 6' 2 (1.88 m), weight 103.6 kg, SpO2 100%.   General: awake, alert, appropriate, sitting up in bed comfortably; NAD HENT: conjugate gaze; oropharynx dry CV: RRR Pulmonary: CTA B/L; no W/R/R- good air movement GI: soft, NT, ND, (+)BS- normoactive Psychiatric: appropriate- bright affect Neurological: Ox3 Extremities: R knee is 2-3+ edema- but incision healing Skin: scalp with dry skin, denuded area where he's scratched. Slightly larger than dollar sized coin- Healing surgical wounds on right shoulder and knee c/d/I, sutures intact at R shoulder wounds. Looks good Sacral wound as below-- not reassessed   PRIOR  EXAMS: Neuro: A&O x3, follows commands, normal speech and migraines, cranial nerves II to XII grossly intact  PRIOR EXAMS: Neuro:  Alert and oriented x 3. Normal insight and awareness. Intact Memory. Normal language and speech. Cranial nerve exam unremarkable. MMT: LUE 5/5/ RUE limited proximally due to pain but 5/5 distally. RLE 2/5 HF,KE pain, 4+ ankle. LLE 4/5 prox to 5/5 distal. Decreased LT in feet.   Musculoskeletal: right knee with swelling,  tender. Right shoulder  tender.     Assessment/Plan: 1. Functional deficits which require 3+ hours per day of interdisciplinary therapy in a comprehensive inpatient rehab setting. Physiatrist is providing close team supervision and 24 hour management of active medical problems listed below. Physiatrist and rehab team continue to assess barriers to discharge/monitor patient progress toward functional and medical goals  Care Tool:  Bathing              Bathing assist Assist Level: Set up assist     Upper Body Dressing/Undressing Upper body dressing        Upper body assist Assist Level: Set up assist    Lower Body Dressing/Undressing Lower body dressing  Lower body assist Assist for lower body dressing: Moderate Assistance - Patient 50 - 74%     Toileting Toileting    Toileting assist Assist for toileting: 2 Helpers     Transfers Chair/bed transfer  Transfers assist     Chair/bed transfer assist level: Contact Guard/Touching assist     Locomotion Ambulation   Ambulation assist   Ambulation activity did not occur: Safety/medical concerns          Walk 10 feet activity   Assist  Walk 10 feet activity did not occur: Safety/medical concerns        Walk 50 feet activity   Assist Walk 50 feet with 2 turns activity did not occur: Safety/medical concerns         Walk 150 feet activity   Assist Walk 150 feet activity did not occur: Safety/medical concerns         Walk 10 feet on  uneven surface  activity   Assist Walk 10 feet on uneven surfaces activity did not occur: Safety/medical concerns         Wheelchair     Assist Is the patient using a wheelchair?: Yes Type of Wheelchair: Power    Wheelchair assist level: Independent Max wheelchair distance: 150'    Wheelchair 50 feet with 2 turns activity    Assist        Assist Level: Independent   Wheelchair 150 feet activity     Assist      Assist Level: Independent   Blood pressure 104/66, pulse 84, temperature 98.3 F (36.8 C), temperature source Oral, resp. rate 15, height 6' 2 (1.88 m), weight 103.6 kg, SpO2 100%.  Medical Problem List and Plan: 1. Functional deficits secondary to septic right knee and shoulder             -patient may shower if incisons are covered             -ELOS/Goals: 15-20 days, min assist goals with PT and OT, W/C level?  D/c date 2/12 Using power w/c currently- will probably need specialty w/c, but will double check  Con't CIR PT and OT- sutures out today of R shoulder-- ordered 1/31 2.  Antithrombotics:  -DVT/anticoagulation:  Pharmaceutical: Heparin  5kU q8h due to ESRD 1/28- advised pt to take his Heparin  since Plavix  doesn't cover DVT prophylaxis             -antiplatelet therapy: ASA/Plavix  daily  3. Pain Management: tylenol  prn, oxycodone  prn. Lidoderm  patches.  -pt will take oxycodone  prior to therapies based on his schedule. I won't schedule it  for now.   1/26- 1/27- Pain meds prn working for him- con't regimen  1/30- pain controlled majority of time- con't regimen  4. Mood/Behavior/Sleep:  LCSW to follow for evaluation and support.              -antipsychotic agents: N/A  -Melatonin PRN  5. Neuropsych/cognition: This patient is capable of making decisions on his own behalf.  6. Skin/Wound Care: surgical incisions CDI, healing  -air mattress for sacral wound, WOC. Santyl  to area of necrotic tissue  -have discussed importance of  nutrition with pt  -medicated shampoo for scalp was helpful -03/07/24 pt inquiring about sutures R shoulder-- looks like surgery was 1/16, weekday team to touch base with ortho regarding timing of removal but likely this coming week.   1/26- can likely remove sutures 1/30  1/28- Pt on regular bed- didn't like air mattress  1/30 sutures out today--  ordered again 1/31, RN aware  7. Fluids/Electrolytes/Nutrition: Strict I/O. Renal diet w/1200 cc/FR. Continue vitamins/supplements. Continue Protonix  40mg  BID  -labs reviewed, stable  8.  Septic arthritis R-knee/R-shoulder: To continue Cefepime  and Vanc with HD thru 04/10/24             --shoulder to be aspirated in 8 weeks per Dr. [name]?  9.  Chronic hypotension: On midodrine  10mg  TID with prn doses. Stable   -1/25 BP stable overall continue current regimen and monitor  1/26- 1/27- BP slightly soft, but denies symptoms 1/28- running a little soft, but overall, not symptomatic in chart that I can find/per pt  1/30- denies OH Sx's Vitals:   03/13/24 1357 03/13/24 1412 03/13/24 1442 03/13/24 1500  BP: 119/68 115/70 126/70 127/71   03/13/24 1528 03/13/24 1558 03/13/24 1628 03/13/24 1658  BP: (!) 158/82 131/75 130/75 130/84   03/13/24 1728 03/13/24 1753 03/13/24 1757 03/14/24 0521  BP: (!) 146/77 128/76 128/76 104/66     10. ESRD: HD MWF continue at the end of the day to help with tolerance of therapy. Continue Cinacalcet  and Sevelamer              --chronic hyponatremia managed with HD/FR  -have requested a piece of bacon or sausage on breakfast plate  -8/75/73 dialysis delayed to today (Sat) d/t census  -1/25 reviewed nephrology note, next HD tomorrow. 1/30- getting HD today per his schedule-- Dr. Melia tried to convince pt 1/31 to do extra dialysis d/t higher EDW but pt refused; appreciate nephro assistance  11. T2DM: A1C- 5.1. Will continue to monitor BS ac/hs and if controlled decrease to twice a day  1/26- will reduce CBGs and SSI to  BID-AC  1/30-31 look great- cont' regimen  CBG (last 3)  Recent Labs    03/13/24 0556 03/13/24 1206 03/14/24 0534  GLUCAP 84 79 94     12. Anemia of chronic disease: Jehovah's witness and refuses blood transfusion. Hgb ~8, continue Aranesp  and iron supplement and folic acid  -1/25 hemoglobin yesterday down to 6.6, he denies any bleeding.  Will check occult stool test.  Nephrology planning for high-dose EPO.  Nephrology ordered iron studies today and considering IV iron.  Patient declined blood transfusion, reports he understands risks.  1/26- labs pending- iron studies today as well  1/27- Given Aranesp  and Iron in HD yesterday along with IV ABX- Hb was 6.3! Will recheck Thursday- but not daily, or will drain him more.  1/28- pt thought had labs in HD yesterday- did not, so did go over the Hb of 6.3 with him. On Aranesp  and PO/IV iron  1/29- Hb was 6.4 yesterday afternoon; 6.3 today! Went back and let pt know  1/30- Hgb 6.3, stable  13. L suppurative Otitis media with TM perforation: Has completed antibiotic course.  --Follow up with [name] for evaluation. -Getting debrox for R ear cerumen impaction, DO NOT USE IN LEFT EAR  14. Cervical myelopathy: Gabapentin  300mg  nightly for neuropathic pain  15. PAD s/p R-SFA/popliteal lithotripsy/Stent: On ASA, Plavix  and Lipitor 10mg  daily    16. Constipation: continue Colace 100mg  BID, psyllium packet daily  -03/14/24 LBM yesterday per pt, not documented, cont regimen     LOS: 11 days A FACE TO FACE EVALUATION WAS PERFORMED  [address] 03/14/2024, 11:01 AM     "

## 2024-03-14 NOTE — Progress Notes (Signed)
 " [name] Progress Note   Subjective:   Has appetite, a little more strength in left leg but still very limited; tolerated HD with 3L UF Wed and another 3L Fr  without cramping. Denies SOB, CP, dizziness, nausea.   Objective Vitals:   03/13/24 1728 03/13/24 1753 03/13/24 1757 03/14/24 0521  BP: (!) 146/77 128/76 128/76 104/66  Pulse: 78 83 80 84  Resp: 16 16 14 15   Temp:   (!) 97.5 F (36.4 C) 98.3 F (36.8 C)  TempSrc:    Oral  SpO2: 100% 100% 100% 100%  Weight:   103.6 kg   Height:       Physical Exam General: Alert male in NAD Heart: RRR, no murmur Lungs: CTA bilaterally, respirations unlabored on RA Abdomen: Soft, non-distended, +BS Extremities: 1+ edema b/l lower extremities, weakness on right side Dialysis Access:  Rt Cimino + T/b  Additional Objective Labs: Basic Metabolic Panel: Recent Labs  Lab 03/11/24 1230 03/12/24 0418 03/13/24 1430  NA 131* 132* 133*  K 4.9 3.8 4.0  CL 88* 92* 92*  CO2 25 29 27   GLUCOSE 88 125* 116*  BUN 62* 34* 50*  CREATININE 6.60* 4.61* 6.69*  CALCIUM  8.9 8.8* 9.0  PHOS 3.6 2.5 3.3   Liver Function Tests: Recent Labs  Lab 03/11/24 1230 03/12/24 0418 03/13/24 1430  ALBUMIN  3.2* 3.1* 3.3*   No results for input(s): LIPASE, AMYLASE in the last 168 hours. CBC: Recent Labs  Lab 03/07/24 1618 03/09/24 1445 03/11/24 1119 03/12/24 0418 03/13/24 1430  WBC 9.5 9.3 8.9 6.2 9.3  NEUTROABS  --   --   --  3.6  --   HGB 6.6* 6.3* 6.4* 6.3* 6.3*  HCT 20.5* 19.6* 19.7* 19.8* 19.8*  MCV 89.5 88.7 88.7 89.2 90.0  PLT 422* 403* 372 343 399   Blood Culture    Component Value Date/Time   SDES TISSUE RIGHT SHOULDER 02/28/2024 1636   SPECREQUEST SWAB C INTRA ARTICULAR SUTURE 02/28/2024 1636   CULT  02/28/2024 1636    No growth aerobically or anaerobically. Performed at [facility], [address] [place], [name]    REPTSTATUS 03/04/2024 FINAL 02/28/2024 1636    Cardiac Enzymes: No  results for input(s): CKTOTAL, CKMB, CKMBINDEX, TROPONINI in the last 168 hours. CBG: Recent Labs  Lab 03/12/24 0618 03/12/24 1709 03/13/24 0556 03/13/24 1206 03/14/24 0534  GLUCAP 82 126* 84 79 94   Iron Studies:  No results for input(s): IRON, TIBC, TRANSFERRIN, FERRITIN in the last 72 hours.  @lablastinr3 @ Studies/Results: No results found. Medications:  ceFEPime  (MAXIPIME ) IV 2 g (03/13/24 1618)   vancomycin  1,000 mg (03/13/24 1652)    (feeding supplement) PROSource Plus  30 mL Oral BID BM   aspirin  EC  81 mg Oral Daily   atorvastatin   10 mg Oral Daily   carbamide peroxide  10 drop Right EAR QPC supper   Chlorhexidine  Gluconate Cloth  6 each Topical Q0600   Chlorhexidine  Gluconate Cloth  6 each Topical Q0600   cinacalcet   120 mg Oral Q breakfast   clopidogrel   75 mg Oral Daily   collagenase    Topical Daily   docusate sodium   100 mg Oral BID   epoetin  alfa  20,000 Units Subcutaneous Q M,W,F-HD   ferrous sulfate   325 mg Oral Q breakfast   folic acid   1 mg Oral Daily   gabapentin   300 mg Oral QHS   heparin   5,000 Units Subcutaneous Q8H   hydrocerin  Topical BID   insulin  aspart  0-6 Units Subcutaneous BID AC   lidocaine   1 patch Transdermal Q24H   midodrine   10 mg Oral TID WC   multivitamin  1 tablet Oral QHS   nutrition supplement (JUVEN)  1 packet Oral BID BM   pantoprazole   40 mg Oral BID AC   psyllium  1 packet Oral Daily   selenium  sulfide  1 Application Topical Daily   sevelamer  carbonate  1,600 mg Oral TID WC    Dialysis Orders:  MWF NW 4h  B400  87.3kg  2K bath  AVF   Heparin  3000 ESA: Mircera 50 q 2 wks (last 12/15) BMM: Calcitriol  2 mcg PO q HD, cinacalcet  120 mg daily   Assessment/Plan: ESRD - on HD MWF -> Tolerated dialysis Monday  with 2 L net UF and appears to be very comfortable. Tolerated 3L UF Wed without cramping or any issues. He's supposed to be ~88-89kg and is currently way up.   Tolerated another 3L UF Fri; I tried  talking him into getting an extra treatment Sat to get him closer to his EDW but he refuses. Counseled him to watch his fluids; hopefully by end of next week we can get him back down. Will try for another 3L on Mon. Will have him weighed again on Sun (if not making progress will have that talk with him again about getting an extra treatment next week)    Volume/chronic hypotension:  - HD with midodrine  support, UF as tolerated -cannot use albumin  due to refusal of blood products which limits our BP support options -encouraged patient to limit his fluid intake   Anemia of ESRD:  - Declines IV albumin /blood products - switched to high-dose EPO 20,000u three times per week w/ HD - can adjust more frequently as needed for this pt who is Jehovah Witness; Hb stable at 6.3-6.6 range   Hyperkalemia:  - resolved   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   Otitis media w/ TM perforation:  - s/p course of abx, ENT has seen   R sided weakness:  - main c/o on admission, CVA w/u was negative   Secondary hyperparathyroidism:  - CorrCa slightly high, VDRA on hold, continue sensipar  - Phos to goal, continue sevelamer  but decr to 2 tabs TIDM 1/29. Phos 3.3 1/31   Nutrition - Continue renal diet and protein supplements   Dispo -In CIR      "

## 2024-03-15 LAB — GLUCOSE, CAPILLARY
Glucose-Capillary: 79 mg/dL (ref 70–99)
Glucose-Capillary: 132 mg/dL — ABNORMAL HIGH (ref 70–99)

## 2024-03-15 MED ORDER — CALCITRIOL 0.5 MCG PO CAPS: 2.0000 ug | ORAL_CAPSULE | ORAL | Status: DC

## 2024-03-15 NOTE — Progress Notes (Signed)
 Occupational Therapy Weekly Progress Note  Patient Details  Name: Carl Meir Sr. MRN: [id number] Date of Birth: 1960-11-30  Beginning of progress report period: March 04, 2024 End of progress report period: March 15, 2024  Today's Date: 03/15/2024 OT Individual Time: 0815 - 0907; 52 min      Patient has met 3 of 3 short term goals.  Pt is progressing with all ADLs and functional transfers/mobility. Pt is increasing functional use of RUE and with pain improving.   Patient continues to demonstrate the following deficits: muscle weakness, decreased cardiorespiratoy endurance, and decreased standing balance, decreased postural control, and decreased balance strategies and therefore will continue to benefit from skilled OT intervention to enhance overall performance with BADL and Reduce care partner burden.  Patient progressing toward long term goals..  Continue plan of care.  OT Short Term Goals Week 1:  OT Short Term Goal 1 (Week 1): Pt will increase ROM of Rt shoulder by 10* OT Short Term Goal 1 - Progress (Week 1): Met OT Short Term Goal 2 (Week 1): pt will complete sit to stand at Mod A using LRAD OT Short Term Goal 2 - Progress (Week 1): Met OT Short Term Goal 3 (Week 1): Pt will stand with Mod A for 1 min< with LRAD in prep for ADLs OT Short Term Goal 3 - Progress (Week 1): Met Week 2:  OT Short Term Goal 1 (Week 2): Pt will complete toileting with Min A overall OT Short Term Goal 2 (Week 2): Pt will complete ADL LB dressing with CGA with AE as necessary OT Short Term Goal 3 (Week 2): Pt will complete ADL activity in standing for 2< min at [facility] with LRAD  Skilled Therapeutic Interventions/Progress Updates:      Therapy Documentation Precautions:  Precautions Precautions: Fall Precaution/Restrictions Comments: watch HR, R knee buckles, spinal Restrictions Weight Bearing Restrictions Per Provider Order: No RUE Weight Bearing Per Provider Order: Weight bearing as  tolerated RLE Weight Bearing Per Provider Order: Weight bearing as tolerated Other Position/Activity Restrictions: Per Dr. [name] on 1/19 pt can perform ROM and WB through RUE as tolerated General: Pt seated in EOB upon OT arrival, agreeable to OT.  Pain: no pain reported  ADL: OT providing skilled intervention on ADL retraining in order to increase independence with tasks and increase activity tolerance. Pt completed the following tasks at the current level of assist: Transfers: Mod A EOB> W/C no LOB/SOB, from raised bed, increased time required  Grooming: seated EOB, pt washing face at set up A   Exercises: OT providing skilled intervention with BLE strength/endurance and ROM by pt completing 15 min on kinetron with increased resistance, set at 20 reps per min. Pt able to complete with occasional rest break for energy conservation, no pain reported.    Pt seated in W/C at end of session with W/C alarm donned, call light within reach and 4Ps assessed.     Therapy/Group: Individual Therapy  Camie Hoe, OTD, OTR/L 03/15/2024, 5:52 PM

## 2024-03-15 NOTE — Progress Notes (Signed)
 " [facility] Progress Note   Subjective:   Has appetite, a little more strength in left leg but still very limited; tolerated HD with 3L UF Wed and another 3L Fr  without cramping. Denies SOB, CP, dizziness, nausea.   Objective Vitals:   03/14/24 1541 03/14/24 2047 03/15/24 0520 03/15/24 0605  BP: (!) 101/53 123/69 120/73   Pulse: 90 74 77   Resp: 18 16 18    Temp: 98.1 F (36.7 C) 97.9 F (36.6 C) 98.5 F (36.9 C)   TempSrc: Oral Oral Oral   SpO2: 99% 100% 100%   Weight:    107.8 kg  Height:       Physical Exam General: Alert male in NAD Heart: RRR, no murmur Lungs: CTA bilaterally, respirations unlabored on RA Abdomen: Soft, non-distended, +BS Extremities: 1+ edema b/l lower extremities, weakness on right side Dialysis Access:  Rt Cimino + T/b  Additional Objective Labs: Basic Metabolic Panel: Recent Labs  Lab 03/11/24 1230 03/12/24 0418 03/13/24 1430  NA 131* 132* 133*  K 4.9 3.8 4.0  CL 88* 92* 92*  CO2 25 29 27   GLUCOSE 88 125* 116*  BUN 62* 34* 50*  CREATININE 6.60* 4.61* 6.69*  CALCIUM  8.9 8.8* 9.0  PHOS 3.6 2.5 3.3   Liver Function Tests: Recent Labs  Lab 03/11/24 1230 03/12/24 0418 03/13/24 1430  ALBUMIN  3.2* 3.1* 3.3*   No results for input(s): LIPASE, AMYLASE in the last 168 hours. CBC: Recent Labs  Lab 03/09/24 1445 03/11/24 1119 03/12/24 0418 03/13/24 1430  WBC 9.3 8.9 6.2 9.3  NEUTROABS  --   --  3.6  --   HGB 6.3* 6.4* 6.3* 6.3*  HCT 19.6* 19.7* 19.8* 19.8*  MCV 88.7 88.7 89.2 90.0  PLT 403* 372 343 399   Blood Culture    Component Value Date/Time   SDES TISSUE RIGHT SHOULDER 02/28/2024 1636   SPECREQUEST SWAB C INTRA ARTICULAR SUTURE 02/28/2024 1636   CULT  02/28/2024 1636    No growth aerobically or anaerobically. Performed at [facility], [address], [place], [name]    REPTSTATUS 03/04/2024 FINAL 02/28/2024 1636    Cardiac Enzymes: No results for input(s): CKTOTAL, CKMB,  CKMBINDEX, TROPONINI in the last 168 hours. CBG: Recent Labs  Lab 03/13/24 0556 03/13/24 1206 03/14/24 0534 03/14/24 1201 03/15/24 0603  GLUCAP 84 79 94 91 79   Iron Studies:  No results for input(s): IRON, TIBC, TRANSFERRIN, FERRITIN in the last 72 hours.  @lablastinr3 @ Studies/Results: No results found. Medications:  ceFEPime  (MAXIPIME ) IV 2 g (03/13/24 1618)   vancomycin  1,000 mg (03/13/24 1652)    (feeding supplement) PROSource Plus  30 mL Oral BID BM   aspirin  EC  81 mg Oral Daily   atorvastatin   10 mg Oral Daily   carbamide peroxide  10 drop Right EAR QPC supper   cinacalcet   120 mg Oral Q breakfast   clopidogrel   75 mg Oral Daily   collagenase    Topical Daily   docusate sodium   100 mg Oral BID   epoetin  alfa  20,000 Units Subcutaneous Q M,W,F-HD   ferrous sulfate   325 mg Oral Q breakfast   folic acid   1 mg Oral Daily   gabapentin   300 mg Oral QHS   heparin   5,000 Units Subcutaneous Q8H   hydrocerin   Topical BID   insulin  aspart  0-6 Units Subcutaneous BID AC   lidocaine   1 patch Transdermal Q24H   midodrine   10 mg  Oral TID WC   multivitamin  1 tablet Oral QHS   nutrition supplement (JUVEN)  1 packet Oral BID BM   pantoprazole   40 mg Oral BID AC   psyllium  1 packet Oral Daily   selenium  sulfide  1 Application Topical Daily   sevelamer  carbonate  1,600 mg Oral TID WC    Dialysis Orders:  MWF NW 4h  B400  87.3kg  2K bath  AVF   Heparin  3000 ESA: Mircera 50 q 2 wks (last 12/15) BMM: Calcitriol  2 mcg PO q HD, cinacalcet  120 mg daily   Assessment/Plan: ESRD - on HD MWF -> Tolerated dialysis Monday  with 2 L net UF and appears to be very comfortable. Tolerated 3L UF Wed without cramping or any issues. He's supposed to be ~88-89kg and is currently way up.   Tolerated another 3L UF Fri; I tried talking him into getting an extra treatment Sat to get him closer to his EDW but he refuses. Counseled him to watch his fluids; hopefully by end of next  week we can get him back down. Will try for another 3L on Mon. Will have him weighed again on Sun (if not making progress will have that talk with him again about getting an extra treatment next week)  HD tomorrow and will try for 3.5L  Volume/chronic hypotension:  - HD with midodrine  support, UF as tolerated -cannot use albumin  due to refusal of blood products which limits our BP support options -encouraged patient to limit his fluid intake   Anemia of ESRD:  - Declines IV albumin /blood products - switched to high-dose EPO 20,000u three times per week w/ HD - can adjust more frequently as needed for this pt who is Jehovah Witness; Hb stable at 6.3-6.6 range   Hyperkalemia:  - resolved   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   Otitis media w/ TM perforation:  - s/p course of abx, ENT has seen   R sided weakness:  - main c/o on admission, CVA w/u was negative   Secondary hyperparathyroidism:  - CorrCa slightly high, VDRA on hold, continue sensipar  - Phos to goal, continue sevelamer  but decr to 2 tabs TIDM 1/29. Phos 3.3 1/31   Nutrition - Continue renal diet and protein supplements   Dispo -In CIR      "

## 2024-03-15 NOTE — Progress Notes (Signed)
 "                                                        PROGRESS NOTE   Subjective/Complaints:   Pt doing well again, slept ok. Pain doing ok. LBM yesterday. No other complaints or concerns.    ROS:  Per HPI    Pt denies SOB, abd pain, CP, N/V/C/D, and vision changes   Objective:   No results found. Recent Labs    03/13/24 1430  WBC 9.3  HGB 6.3*  HCT 19.8*  PLT 399   Recent Labs    03/13/24 1430  NA 133*  K 4.0  CL 92*  CO2 27  GLUCOSE 116*  BUN 50*  CREATININE 6.69*  CALCIUM  9.0    Intake/Output Summary (Last 24 hours) at 03/15/2024 0827 Last data filed at 03/15/2024 0745 Gross per 24 hour  Intake 600 ml  Output --  Net 600 ml     Wound 03/03/24 0330 Pressure Injury Sacrum Left Unstageable - Full thickness tissue loss in which the base of the injury is covered by slough (yellow, tan, gray, green or brown) and/or eschar (tan, brown or black) in the wound bed. (Active)    Physical Exam: Vital Signs Blood pressure 120/73, pulse 77, temperature 98.5 F (36.9 C), temperature source Oral, resp. rate 18, height 6' 2 (1.88 m), weight 107.8 kg, SpO2 100%.   General: awake, alert, appropriate, sitting up at EOB comfortably eating breakfast; NAD HENT: conjugate gaze; oropharynx dry CV: RRR Pulmonary: CTA B/L; no W/R/R- good air movement GI: soft, NT, ND, (+)BS- normoactive Psychiatric: appropriate- bright affect, good spirits Neurological: Ox3 Extremities: R knee is 2-3+ edema- but incision healing Skin: scalp with dry skin, denuded area where he's scratched. Slightly larger than dollar sized coin- Healing surgical wounds on right shoulder and knee c/d/I, sutures intact at R shoulder wounds. Looks good Sacral wound as below-- not reassessed   PRIOR EXAMS: Neuro: A&O x3, follows commands, normal speech and migraines, cranial nerves II to XII grossly intact  PRIOR EXAMS: Neuro:  Alert and oriented x 3. Normal insight and awareness. Intact Memory. Normal  language and speech. Cranial nerve exam unremarkable. MMT: LUE 5/5/ RUE limited proximally due to pain but 5/5 distally. RLE 2/5 HF,KE pain, 4+ ankle. LLE 4/5 prox to 5/5 distal. Decreased LT in feet.   Musculoskeletal: right knee with swelling,  tender. Right shoulder  tender.     Assessment/Plan: 1. Functional deficits which require 3+ hours per day of interdisciplinary therapy in a comprehensive inpatient rehab setting. Physiatrist is providing close team supervision and 24 hour management of active medical problems listed below. Physiatrist and rehab team continue to assess barriers to discharge/monitor patient progress toward functional and medical goals  Care Tool:  Bathing              Bathing assist Assist Level: Set up assist     Upper Body Dressing/Undressing Upper body dressing        Upper body assist Assist Level: Set up assist    Lower Body Dressing/Undressing Lower body dressing            Lower body assist Assist for lower body dressing: Moderate Assistance - Patient 50 - 74%     Toileting Toileting    Toileting assist Assist for  toileting: 2 Helpers     Transfers Chair/bed transfer  Transfers assist     Chair/bed transfer assist level: Contact Guard/Touching assist     Locomotion Ambulation   Ambulation assist   Ambulation activity did not occur: Safety/medical concerns          Walk 10 feet activity   Assist  Walk 10 feet activity did not occur: Safety/medical concerns        Walk 50 feet activity   Assist Walk 50 feet with 2 turns activity did not occur: Safety/medical concerns         Walk 150 feet activity   Assist Walk 150 feet activity did not occur: Safety/medical concerns         Walk 10 feet on uneven surface  activity   Assist Walk 10 feet on uneven surfaces activity did not occur: Safety/medical concerns         Wheelchair     Assist Is the patient using a wheelchair?: Yes Type of  Wheelchair: Power    Wheelchair assist level: Independent Max wheelchair distance: 150'    Wheelchair 50 feet with 2 turns activity    Assist        Assist Level: Independent   Wheelchair 150 feet activity     Assist      Assist Level: Independent   Blood pressure 120/73, pulse 77, temperature 98.5 F (36.9 C), temperature source Oral, resp. rate 18, height 6' 2 (1.88 m), weight 107.8 kg, SpO2 100%.  Medical Problem List and Plan: 1. Functional deficits secondary to septic right knee and shoulder             -patient may shower if incisons are covered             -ELOS/Goals: 15-20 days, min assist goals with PT and OT, W/C level?  D/c date 2/12 Using power w/c currently- will probably need specialty w/c, but will double check  Con't CIR PT and OT- sutures out today of R shoulder-- ordered 1/31 2.  Antithrombotics:  -DVT/anticoagulation:  Pharmaceutical: Heparin  5kU q8h due to ESRD 1/28- advised pt to take his Heparin  since Plavix  doesn't cover DVT prophylaxis             -antiplatelet therapy: ASA/Plavix  daily  3. Pain Management: tylenol  prn, oxycodone  prn. Lidoderm  patches.  -pt will take oxycodone  prior to therapies based on his schedule. I won't schedule it  for now.   1/26- 1/27- Pain meds prn working for him- con't regimen  1/30- pain controlled majority of time- con't regimen  4. Mood/Behavior/Sleep:  LCSW to follow for evaluation and support.              -antipsychotic agents: N/A  -Melatonin PRN  5. Neuropsych/cognition: This patient is capable of making decisions on his own behalf.  6. Skin/Wound Care: surgical incisions CDI, healing  -air mattress for sacral wound, WOC. Santyl  to area of necrotic tissue  -have discussed importance of nutrition with pt  -medicated shampoo for scalp was helpful -03/07/24 pt inquiring about sutures R shoulder-- looks like surgery was 1/16, weekday team to touch base with ortho regarding timing of removal but likely  this coming week.   1/26- can likely remove sutures 1/30  1/28- Pt on regular bed- didn't like air mattress  1/30 sutures out today-- ordered again 1/31, RN aware  7. Fluids/Electrolytes/Nutrition: Strict I/O. Renal diet w/1200 cc/FR. Continue vitamins/supplements. Continue Protonix  40mg  BID  -labs reviewed, stable  8.  Septic  arthritis R-knee/R-shoulder: To continue Cefepime  and Vanc with HD thru 04/10/24             --shoulder to be aspirated in 8 weeks per Dr. [name]?  9.  Chronic hypotension: On midodrine  10mg  TID with prn doses. Stable  -1/25 BP stable overall continue current regimen and monitor  1/26- 1/27- BP slightly soft, but denies symptoms 1/28- running a little soft, but overall, not symptomatic in chart that I can find/per pt  1/30- denies OH Sx's Vitals:   03/13/24 1500 03/13/24 1528 03/13/24 1558 03/13/24 1628  BP: 127/71 (!) 158/82 131/75 130/75   03/13/24 1658 03/13/24 1728 03/13/24 1753 03/13/24 1757  BP: 130/84 (!) 146/77 128/76 128/76   03/14/24 0521 03/14/24 1541 03/14/24 2047 03/15/24 0520  BP: 104/66 (!) 101/53 123/69 120/73     10. ESRD: HD MWF continue at the end of the day to help with tolerance of therapy. Continue Cinacalcet  and Sevelamer              --chronic hyponatremia managed with HD/FR  -have requested a piece of bacon or sausage on breakfast plate  -8/75/73 dialysis delayed to today (Sat) d/t census  -1/25 reviewed nephrology note, next HD tomorrow. 1/30- getting HD today per his schedule-- Dr. Melia tried to convince pt 1/31 to do extra dialysis d/t higher EDW but pt refused; appreciate nephro assistance  11. T2DM: A1C- 5.1. Will continue to monitor BS ac/hs and if controlled decrease to twice a day  1/26- will reduce CBGs and SSI to BID-AC  1/30-2/1 look great- cont' regimen  CBG (last 3)  Recent Labs    03/14/24 0534 03/14/24 1201 03/15/24 0603  GLUCAP 94 91 79     12. Anemia of chronic disease: Jehovah's witness and refuses blood  transfusion. Hgb ~8, continue Aranesp  and iron supplement and folic acid  -1/25 hemoglobin yesterday down to 6.6, he denies any bleeding.  Will check occult stool test.  Nephrology planning for high-dose EPO.  Nephrology ordered iron studies today and considering IV iron.  Patient declined blood transfusion, reports he understands risks.  1/26- labs pending- iron studies today as well  1/27- Given Aranesp  and Iron in HD yesterday along with IV ABX- Hb was 6.3! Will recheck Thursday- but not daily, or will drain him more.  1/28- pt thought had labs in HD yesterday- did not, so did go over the Hb of 6.3 with him. On Aranesp  and PO/IV iron  1/29- Hb was 6.4 yesterday afternoon; 6.3 today! Went back and let pt know  1/30- Hgb 6.3, stable  13. L suppurative Otitis media with TM perforation: Has completed antibiotic course.  --Follow up with [name] for evaluation. -Getting debrox for R ear cerumen impaction, DO NOT USE IN LEFT EAR  14. Cervical myelopathy: Gabapentin  300mg  nightly for neuropathic pain  15. PAD s/p R-SFA/popliteal lithotripsy/Stent: On ASA, Plavix  and Lipitor 10mg  daily    16. Constipation: continue Colace 100mg  BID, psyllium packet daily  -03/15/24 LBM yesterday, cont regimen     LOS: 12 days A FACE TO FACE EVALUATION WAS PERFORMED  [address] Calani Gick 03/15/2024, 8:27 AM     "

## 2024-03-16 LAB — CBC WITH DIFFERENTIAL/PLATELET
Abs Immature Granulocytes: 0.03 10*3/uL (ref 0.00–0.07)
Basophils Absolute: 0.1 10*3/uL (ref 0.0–0.1)
Basophils Relative: 2 %
Eosinophils Absolute: 1.4 10*3/uL — ABNORMAL HIGH (ref 0.0–0.5)
Eosinophils Relative: 19 %
HCT: 21.9 % — ABNORMAL LOW (ref 39.0–52.0)
Hemoglobin: 7 g/dL — ABNORMAL LOW (ref 13.0–17.0)
Immature Granulocytes: 0 %
Lymphocytes Relative: 13 %
Lymphs Abs: 0.9 10*3/uL (ref 0.7–4.0)
MCH: 28.7 pg (ref 26.0–34.0)
MCHC: 32 g/dL (ref 30.0–36.0)
MCV: 89.8 fL (ref 80.0–100.0)
Monocytes Absolute: 0.7 10*3/uL (ref 0.1–1.0)
Monocytes Relative: 9 %
Neutro Abs: 4.2 10*3/uL (ref 1.7–7.7)
Neutrophils Relative %: 57 %
Platelets: 354 10*3/uL (ref 150–400)
RBC: 2.44 MIL/uL — ABNORMAL LOW (ref 4.22–5.81)
RDW: 16.2 % — ABNORMAL HIGH (ref 11.5–15.5)
WBC: 7.4 10*3/uL (ref 4.0–10.5)
nRBC: 0 % (ref 0.0–0.2)

## 2024-03-16 LAB — RENAL FUNCTION PANEL
Albumin: 3.2 g/dL — ABNORMAL LOW (ref 3.5–5.0)
Anion gap: 15 (ref 5–15)
BUN: 67 mg/dL — ABNORMAL HIGH (ref 8–23)
CO2: 27 mmol/L (ref 22–32)
Calcium: 8.8 mg/dL — ABNORMAL LOW (ref 8.9–10.3)
Chloride: 90 mmol/L — ABNORMAL LOW (ref 98–111)
Creatinine, Ser: 7.62 mg/dL — ABNORMAL HIGH (ref 0.61–1.24)
GFR, Estimated: 7 mL/min — ABNORMAL LOW
Glucose, Bld: 80 mg/dL (ref 70–99)
Phosphorus: 3.5 mg/dL (ref 2.5–4.6)
Potassium: 4.6 mmol/L (ref 3.5–5.1)
Sodium: 132 mmol/L — ABNORMAL LOW (ref 135–145)

## 2024-03-16 LAB — GLUCOSE, CAPILLARY
Glucose-Capillary: 81 mg/dL (ref 70–99)
Glucose-Capillary: 85 mg/dL (ref 70–99)

## 2024-03-16 MED ORDER — ALTEPLASE 2 MG IJ SOLR: 2.0000 mg | Freq: Once | INTRAMUSCULAR | Status: DC | PRN

## 2024-03-16 MED ORDER — HEPARIN SODIUM (PORCINE) 1000 UNIT/ML DIALYSIS: 1000.0000 [IU] | INTRAMUSCULAR | Status: DC | PRN

## 2024-03-16 MED ORDER — PENTAFLUOROPROP-TETRAFLUOROETH EX AERO: 1.0000 | INHALATION_SPRAY | CUTANEOUS | Status: DC | PRN

## 2024-03-16 MED ORDER — LIDOCAINE-PRILOCAINE 2.5-2.5 % EX CREA: 1.0000 | TOPICAL_CREAM | CUTANEOUS | Status: DC | PRN

## 2024-03-16 MED ORDER — LIDOCAINE HCL (PF) 1 % IJ SOLN: 5.0000 mL | INTRAMUSCULAR | Status: DC | PRN

## 2024-03-16 NOTE — Progress Notes (Signed)
 " [facility] Progress Note   Subjective:   Seen in room. Eating lunch, good appetite. Therapy going ok, working on LE strength. For dialysis today.   Objective Vitals:   03/15/24 0605 03/15/24 1630 03/15/24 2202 03/16/24 0629  BP:  133/71 (!) 156/86 108/77  Pulse:  73 74 91  Resp:  18 18 19   Temp:  (!) 97.4 F (36.3 C) 97.7 F (36.5 C) 98.2 F (36.8 C)  TempSrc:  Oral Oral Oral  SpO2:  100% 100% 100%  Weight: 107.8 kg     Height:       Physical Exam General: Alert, nad Heart: RRR, no murmur Lungs: Clear, respirations unlabored on RA Abdomen: Non-tender  Extremities: 1+ edema b/l lower extremities, weakness on right side Dialysis Access:  Rt Cimino + T/b  Additional Objective Labs: Basic Metabolic Panel: Recent Labs  Lab 03/12/24 0418 03/13/24 1430 03/16/24 0413  NA 132* 133* 132*  K 3.8 4.0 4.6  CL 92* 92* 90*  CO2 29 27 27   GLUCOSE 125* 116* 80  BUN 34* 50* 67*  CREATININE 4.61* 6.69* 7.62*  CALCIUM  8.8* 9.0 8.8*  PHOS 2.5 3.3 3.5   Liver Function Tests: Recent Labs  Lab 03/12/24 0418 03/13/24 1430 03/16/24 0413  ALBUMIN  3.1* 3.3* 3.2*   No results for input(s): LIPASE, AMYLASE in the last 168 hours. CBC: Recent Labs  Lab 03/09/24 1445 03/11/24 1119 03/12/24 0418 03/13/24 1430 03/16/24 0413  WBC 9.3 8.9 6.2 9.3 7.4  NEUTROABS  --   --  3.6  --  4.2  HGB 6.3* 6.4* 6.3* 6.3* 7.0*  HCT 19.6* 19.7* 19.8* 19.8* 21.9*  MCV 88.7 88.7 89.2 90.0 89.8  PLT 403* 372 343 399 354   Blood Culture    Component Value Date/Time   SDES TISSUE RIGHT SHOULDER 02/28/2024 1636   SPECREQUEST SWAB C INTRA ARTICULAR SUTURE 02/28/2024 1636   CULT  02/28/2024 1636    No growth aerobically or anaerobically. Performed at [facility], [address], [place], [name]    REPTSTATUS 03/04/2024 FINAL 02/28/2024 1636    Cardiac Enzymes: No results for input(s): CKTOTAL, CKMB, CKMBINDEX, TROPONINI in the last 168  hours. CBG: Recent Labs  Lab 03/14/24 0534 03/14/24 1201 03/15/24 0603 03/15/24 1628 03/16/24 0628  GLUCAP 94 91 79 132* 81   Iron Studies:  No results for input(s): IRON, TIBC, TRANSFERRIN, FERRITIN in the last 72 hours.  @lablastinr3 @ Studies/Results: No results found. Medications:  ceFEPime  (MAXIPIME ) IV 2 g (03/13/24 1618)   vancomycin  1,000 mg (03/13/24 1652)    (feeding supplement) PROSource Plus  30 mL Oral BID BM   aspirin  EC  81 mg Oral Daily   atorvastatin   10 mg Oral Daily   carbamide peroxide  10 drop Right EAR QPC supper   cinacalcet   120 mg Oral Q breakfast   clopidogrel   75 mg Oral Daily   collagenase    Topical Daily   docusate sodium   100 mg Oral BID   epoetin  alfa  20,000 Units Subcutaneous Q M,W,F-HD   ferrous sulfate   325 mg Oral Q breakfast   folic acid   1 mg Oral Daily   gabapentin   300 mg Oral QHS   heparin   5,000 Units Subcutaneous Q8H   hydrocerin   Topical BID   insulin  aspart  0-6 Units Subcutaneous BID AC   lidocaine   1 patch Transdermal Q24H   midodrine   10 mg Oral TID WC   multivitamin  1 tablet  Oral QHS   pantoprazole   40 mg Oral BID AC   psyllium  1 packet Oral Daily   selenium  sulfide  1 Application Topical Daily   sevelamer  carbonate  1,600 mg Oral TID WC    Dialysis Orders:  MWF NW 4h  B400  87.3kg  2K bath  AVF   Heparin  3000 ESA: Mircera 50 q 2 wks (last 12/15) BMM: Calcitriol  2 mcg PO q HD, cinacalcet  120 mg daily   Assessment/Plan: ESRD - on HD MWF -> Tolerated dialysis Monday  with 2 L net UF and appears to be very comfortable. Tolerated 3L UF Wed without cramping or any issues. He's supposed to be ~88-89kg and is currently way up.   Trying to get closer to EDW.   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   Otitis media w/ TM perforation:  - s/p course of abx, ENT has seen   R sided weakness:  - main c/o on admission, CVA w/u was negative   Secondary hyperparathyroidism:  - CorrCa slightly high, VDRA on hold, continue sensipar  - Phos to goal, continue sevelamer  but decr to 2 tabs TIDM 1/29. Phos 3.3 1/31   Nutrition - Continue renal diet and protein supplements   Dispo -In CIR    [name] Ronnald Acosta PA-C [facility] 03/16/2024,2:08 PM   "

## 2024-03-16 NOTE — Plan of Care (Signed)
" °  Problem: Consults Goal: RH GENERAL PATIENT EDUCATION Description: See Patient Education module for education specifics. Outcome: Progressing Goal: Nutrition Consult-if indicated Outcome: Progressing Goal: Diabetes Guidelines if Diabetic/Glucose > 140 Description: If diabetic or lab glucose is > 140 mg/dl - Initiate Diabetes/Hyperglycemia Guidelines & Document Interventions  Outcome: Progressing   Problem: RH BOWEL ELIMINATION Goal: RH STG MANAGE BOWEL WITH ASSISTANCE Description: STG Manage Bowel with mod I Assistance. Outcome: Progressing Goal: RH STG MANAGE BOWEL W/MEDICATION W/ASSISTANCE Description: STG Manage Bowel with Medication with mod I Assistance. Outcome: Progressing   Problem: RH SAFETY Goal: RH STG ADHERE TO SAFETY PRECAUTIONS W/ASSISTANCE/DEVICE Description: STG Adhere to Safety Precautions With cues Assistance/Device. Outcome: Progressing   Problem: RH PAIN MANAGEMENT Goal: RH STG PAIN MANAGED AT OR BELOW PT'S PAIN GOAL Description: Pain < 4 with prns Outcome: Progressing   Problem: RH KNOWLEDGE DEFICIT GENERAL Goal: RH STG INCREASE KNOWLEDGE OF SELF CARE AFTER HOSPITALIZATION Description: Patient and son will be able to manage care at discharge using educational resources independently Outcome: Progressing   "

## 2024-03-16 NOTE — Progress Notes (Signed)
 Physical Therapy Session Note  Patient Details  Name: Carl Lahaie Sr. MRN: [id number] Date of Birth: [birth year]  Today's Date: 03/16/2024 PT Individual Time: 0930-1045 PT Individual Time Calculation (min): 75 min   Short Term Goals: Week 2:  PT Short Term Goal 1 (Week 2): pt will initiate gait training PT Short Term Goal 2 (Week 2): Pt will perform STS from 24 surface with assist PT Short Term Goal 3 (Week 2): Pt will tolerate standing>5 min  Skilled Therapeutic Interventions/Progress Updates:    Pt received in PWC, agreeable to therapy. Pt reports 4-5/10 knee pain at rest. Pt requested pain medication, which nsg provided.    Pt navigated PWC throughout session at mod I level. Session focused on standing and side stepping to progress toward functional gait. Pt able to stand with mod a from 25.5 mat table. Cueing for using momentum and anterior weight shift. 3 x 6 ft laps, needed seated rest breaks 2/3 laps. Therapist provided R knee block for small amount of buckling.   On return to room, pt requested to use bathroom. Min a to stand from slightly lowered chair to stedy and for standing in bathroom, CGA once in standing for 3/3 toileting tasks.     Pt then performed x 5 Sit to stand min a to stedy from lowered chair (knees ~90deg) for strength and improved independence with transfers. Cues for anterior weight shift and decreasing UE reliance for improved independence. During session, reiterated that pt does not qualify for PWC and he will need to switch to manual as soon as he can stand reliably from surface of that height. Pt remained in PWC at end of session with needs in reach.   Therapy Documentation Precautions:  Precautions Precautions: Fall Precaution/Restrictions Comments: watch HR, R knee buckles, spinal Restrictions Weight Bearing Restrictions Per Provider Order: No RUE Weight Bearing Per Provider Order: Weight bearing as tolerated RLE Weight Bearing Per Provider Order:  Weight bearing as tolerated Other Position/Activity Restrictions: Per Dr. [name] on 1/19 pt can perform ROM and WB through RUE as tolerated General:       Therapy/Group: Individual Therapy  [name] 03/16/2024, 9:41 AM

## 2024-03-17 ENCOUNTER — Inpatient Hospital Stay (HOSPITAL_COMMUNITY)

## 2024-03-17 LAB — GLUCOSE, CAPILLARY
Glucose-Capillary: 76 mg/dL (ref 70–99)
Glucose-Capillary: 101 mg/dL — ABNORMAL HIGH (ref 70–99)

## 2024-03-17 MED ORDER — CHLORHEXIDINE GLUCONATE CLOTH 2 % EX PADS
6.0000 | MEDICATED_PAD | Freq: Every day | CUTANEOUS | Status: DC
  Administered 2024-03-18 – 2024-03-19 (×2): 6 via TOPICAL

## 2024-03-17 NOTE — Progress Notes (Signed)
 Physical Therapy Session Note  Patient Details  Name: Willian Donson Sr. MRN: [id number] Date of Birth: 1960/08/05  Today's Date: 03/17/2024 PT Individual Time: 1000-1100 PT Individual Time Calculation (min): 60 min   Short Term Goals: Week 2:  PT Short Term Goal 1 (Week 2): pt will initiate gait training PT Short Term Goal 2 (Week 2): Pt will perform STS from 24 surface with assist PT Short Term Goal 3 (Week 2): Pt will tolerate standing>5 min  Skilled Therapeutic Interventions/Progress Updates:    Pt received in PWC, agreeable to therapy. Pt reports some pain in R knee, but improved and largely controlled at this time. Pt navigated PWC at mod I level throughout session. Pt performed Sit to stand from 25 surfaces (chair and mat table) with fluctuating mod a. Stand pivot transfer with CGA, pt demoes decr stance time and flexed posture in RLE, but no true buckling noted this session. Session focused on LE strength with 3 x 5 Sit to stand from 25 mat table and then 2 x 6 squats. Extensive cueing for mechanics and technique. Pt greatly improved with cueing to push forward with BUE vs pushing up. Pt returned to room after session and remained in PWC, all needs in reach.  Therapy Documentation Precautions:  Precautions Precautions: Fall Precaution/Restrictions Comments: watch HR, R knee buckles, spinal Restrictions Weight Bearing Restrictions Per Provider Order: No RUE Weight Bearing Per Provider Order: Weight bearing as tolerated RLE Weight Bearing Per Provider Order: Weight bearing as tolerated Other Position/Activity Restrictions: Per Dr. [name] on 1/19 pt can perform ROM and WB through RUE as tolerated General:       Therapy/Group: Individual Therapy  [name] 03/17/2024, 11:49 AM

## 2024-03-17 NOTE — Progress Notes (Signed)
 " [name] Progress Note   Subjective:   Seen in room. Feels well. Dialysis yesterday with 3.5L UF.   Objective Vitals:   03/16/24 1851 03/16/24 1857 03/16/24 2003 03/17/24 0555  BP: 113/72 126/75 128/81 116/70  Pulse: 91 99 (!) 103 89  Resp: 11 16 18 18   Temp:  97.8 F (36.6 C) 98.1 F (36.7 C) 98 F (36.7 C)  TempSrc:   Oral Oral  SpO2: 100% 100% 100% 100%  Weight:  104.1 kg    Height:       Physical Exam General: Alert, nad Heart: RRR, no murmur Lungs: Clear, respirations unlabored on RA Abdomen: Non-tender  Extremities: 1+ edema b/l lower extremities, weakness on right side Dialysis Access:  Rt Cimino + T/b  Additional Objective Labs: Basic Metabolic Panel: Recent Labs  Lab 03/12/24 0418 03/13/24 1430 03/16/24 0413  NA 132* 133* 132*  K 3.8 4.0 4.6  CL 92* 92* 90*  CO2 29 27 27   GLUCOSE 125* 116* 80  BUN 34* 50* 67*  CREATININE 4.61* 6.69* 7.62*  CALCIUM  8.8* 9.0 8.8*  PHOS 2.5 3.3 3.5   Liver Function Tests: Recent Labs  Lab 03/12/24 0418 03/13/24 1430 03/16/24 0413  ALBUMIN  3.1* 3.3* 3.2*   No results for input(s): LIPASE, AMYLASE in the last 168 hours. CBC: Recent Labs  Lab 03/11/24 1119 03/12/24 0418 03/13/24 1430 03/16/24 0413  WBC 8.9 6.2 9.3 7.4  NEUTROABS  --  3.6  --  4.2  HGB 6.4* 6.3* 6.3* 7.0*  HCT 19.7* 19.8* 19.8* 21.9*  MCV 88.7 89.2 90.0 89.8  PLT 372 343 399 354   Blood Culture    Component Value Date/Time   SDES TISSUE RIGHT SHOULDER 02/28/2024 1636   SPECREQUEST SWAB C INTRA ARTICULAR SUTURE 02/28/2024 1636   CULT  02/28/2024 1636    No growth aerobically or anaerobically. Performed at [facility], [address] [place], [name]    REPTSTATUS 03/04/2024 FINAL 02/28/2024 1636    Cardiac Enzymes: No results for input(s): CKTOTAL, CKMB, CKMBINDEX, TROPONINI in the last 168 hours. CBG: Recent Labs  Lab 03/15/24 0603 03/15/24 1628 03/16/24 0628 03/16/24 2003  03/17/24 0555  GLUCAP 79 132* 81 85 76   Iron Studies:  No results for input(s): IRON, TIBC, TRANSFERRIN, FERRITIN in the last 72 hours.  @lablastinr3 @ Studies/Results: No results found. Medications:  ceFEPime  (MAXIPIME ) IV 2 g (03/16/24 1738)   vancomycin  1,000 mg (03/16/24 1737)    (feeding supplement) PROSource Plus  30 mL Oral BID BM   aspirin  EC  81 mg Oral Daily   atorvastatin   10 mg Oral Daily   carbamide peroxide  10 drop Right EAR QPC supper   cinacalcet   120 mg Oral Q breakfast   clopidogrel   75 mg Oral Daily   collagenase    Topical Daily   docusate sodium   100 mg Oral BID   epoetin  alfa  20,000 Units Subcutaneous Q M,W,F-HD   ferrous sulfate   325 mg Oral Q breakfast   folic acid   1 mg Oral Daily   gabapentin   300 mg Oral QHS   heparin   5,000 Units Subcutaneous Q8H   hydrocerin   Topical BID   insulin  aspart  0-6 Units Subcutaneous BID AC   lidocaine   1 patch Transdermal Q24H   midodrine   10 mg Oral TID WC   multivitamin  1 tablet Oral QHS   pantoprazole   40 mg Oral BID AC   psyllium  1 packet Oral  Daily   selenium  sulfide  1 Application Topical Daily   sevelamer  carbonate  1,600 mg Oral TID WC    Dialysis Orders:  MWF NW 4h  B400  87.3kg  2K bath  AVF   Heparin  3000 ESA: Mircera 50 q 2 wks (last 12/15) BMM: Calcitriol  2 mcg PO q HD, cinacalcet  120 mg daily   Assessment/Plan: ESRD - on HD MWF -> Tolerated dialysis Monday  with 2 L net UF and appears to be very comfortable. Tolerated 3L UF Wed without cramping or any issues. He's supposed to be ~88-89kg and is currently way up.   Trying to get closer to EDW.  Continue to push UF with HD.   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   Otitis media w/ TM perforation:  - s/p course of abx, ENT has seen   R sided weakness:  - main c/o on admission, CVA w/u was negative   Secondary hyperparathyroidism:  - CorrCa slightly high, VDRA on hold, continue sensipar  - Phos to goal, continue sevelamer  but decr to 2 tabs TIDM 1/29. Phos 3.3 1/31   Nutrition - Continue renal diet and protein supplements   Dispo - In CIR - Anticipated discharge 2/12     Maisie Ronnald Acosta PA-C [name] 03/17/2024,12:58 PM   "

## 2024-03-17 NOTE — Progress Notes (Signed)
 Nutrition Follow-up  DOCUMENTATION CODES:   Non-severe (moderate) malnutrition in context of chronic illness  INTERVENTION:   Continue ProSource Plus; each supplement provides 100 kcal and 15 g protein  Encouraged adequate intake of meals and supplements to meet calorie and protein needs  NUTRITION DIAGNOSIS:   Moderate Malnutrition related to chronic illness as evidenced by moderate fat depletion, mild fat depletion, mild muscle depletion, severe muscle depletion.  Remains applicable  GOAL:   Patient will meet greater than or equal to 90% of their needs  Progressing  MONITOR:   PO intake, Supplement acceptance  REASON FOR ASSESSMENT:   Consult Diet education, Wound healing  ASSESSMENT:   PMH: renal cancer, ESRD, HD, PUD, HTN, C4 spinal fracture, R cholectomy, infection in right knee  Admitted with: debility, wounds on knee, sacrum, shoulder   Spoke with pt before he left for xray. Pt reports appetite is fair, intake varies based on taste of food and offerings from kitchen. Pt continues to accept ProSource supplements but has been concerned about fluid intake. Pt reports he feels like his weight remains high due to fluid and states he is on a restriction but he ends up drinking a lot of fluid with medications which he feels adds up. Discussed tips for combining medications to take at the same time to reduce fluid intake or other ways to take medications without fluid. Pt mostly concerned about getting back dry to EDW.   Pt has been tolerating HD treatments well and recent treatments able to pull off 3.5L of fluid. Will continue to monitor tolerance of sessions. Recommend continuing renal diet restrictions and encouraging protein supplementation with ProSource instead of other ONS that provide increased fluids.   Recommend assessing medication list and keeping priority medications but limiting excessive medications which contribute to pt burden and fluid intake. Juven  previously discontinued to limit fluid.   Average Meal Completion: 1/23-1/28: 79% average intake x 8  recorded meals 1/31-2/3: 67% average intake x 8 recorded meals Accepts 2 Prosources daily (200 kcal, 30 g protein)  Nutritionally Relevant Medications: Prosource Plus BID Cinacalcet  daily Colace BID EPO 3 x weekly Ferrous sulfate  daily Folic acid  daily SSI 0-6 units BID Midodrine  TID Rena Vit Juven BID Protonix  BID Psyllium Renvela    Labs reviewed: CBG x 24 h: 76-132 mg/dL Sodium 867 BUN 67 Cr 2.37 Phos 3.5 (WNL) Albumin  3.2 Hgb 7.0  Admit weight: 86 kg Current weight: 104.1 kg  **current wt still much higher than EDW**  Dialysis Orders: MWF NW 4h  B400  87.3kg  2K bath  AVF   Heparin  3000 ESA: Mircera 50 q 2 wks (last 12/15) BMM: Calcitriol  2 mcg PO q HD, cinacalcet  120 mg daily  Last HD: 2/2 UF 3.5L Next HD: 2/4   Diet Order:   Diet Order             Diet renal with fluid restriction Fluid restriction: 1200 mL Fluid; Room service appropriate? Yes; Fluid consistency: Thin  Diet effective now                   EDUCATION NEEDS:   Education needs have been addressed  Skin:  Skin Assessment: Reviewed RN Assessment Skin Integrity Issues:: Unstageable Unstageable: Unable to access wounds in person  Last BM:  2/1 type 3  Height:   Ht Readings from Last 1 Encounters:  03/03/24 6' 2 (1.88 m)    Weight:   Wt Readings from Last 1 Encounters:  03/16/24 104.1 kg  BMI:  Body mass index is 29.47 kg/m.  Estimated Nutritional Needs:   Kcal:  2100-2400  Protein:  125-150 g  Fluid:  1.2 L    Josette Glance, MS, RDN, LDN Clinical Dietitian I Please reach out via secure chat

## 2024-03-17 NOTE — Progress Notes (Shared)
 Physical Therapy Weekly Progress Note  Patient Details  Name: Carl Eckardt Sr. MRN: [id number] Date of Birth: February 25, 1960  Beginning of progress report period: March 12, 2024 End of progress report period: {Time; dates multiple:304500300}  {CHL IP REHAB PT TIME CALCULATION:304800500}  Patient has met {number 1-5:22450} of {number 1-5:20334} short term goals.  Mr. Valbuena is progressing well. He requires supervision with bed mobility and has progressed to be able to stand from *** surface with ***a.  He is most limited by R knee pain and difficulty performing adequate anterior weight shift in order to stand from lower surfaces. His R knee is somewhat limited with terminal knee extension, but he is able to achieve with stretching. He is currently using a PWC while in rehab, but understands that he will transition to a standard manual when he can reliably stand from the lower surface.    Patient continues to demonstrate the following deficits muscle weakness and muscle joint tightness and decreased standing balance and decreased balance strategies and therefore will continue to benefit from skilled PT intervention to increase functional independence with mobility.  Patient progressing toward long term goals..  Continue plan of care.  PT Short Term Goals {DUH:6958314}  Skilled Therapeutic Interventions/Progress Updates:      Therapy Documentation Precautions:  Precautions Precautions: Fall Precaution/Restrictions Comments: watch HR, R knee buckles, spinal Restrictions Weight Bearing Restrictions Per Provider Order: No RUE Weight Bearing Per Provider Order: Weight bearing as tolerated RLE Weight Bearing Per Provider Order: Weight bearing as tolerated Other Position/Activity Restrictions: Per Dr. [name] on 1/19 pt can perform ROM and WB through RUE as tolerated General:   Vital Signs: Therapy Vitals Temp: 98.2 F (36.8 C) Temp Source: Oral Pulse Rate: 89 Resp: 16 BP: (!)  94/54 Patient Position (if appropriate): Sitting Oxygen Therapy SpO2: 100 % O2 Device: Room Air Pain:   Vision/Perception     Mobility:   Locomotion :    Trunk/Postural Assessment :    Balance:   Exercises:   Other Treatments:     Therapy/Group: {Therapy/Group:[id number]}  Laylaa Guevarra C Woods Gangemi 03/17/2024, 4:26 PM

## 2024-03-18 LAB — GLUCOSE, CAPILLARY
Glucose-Capillary: 83 mg/dL (ref 70–99)
Glucose-Capillary: 78 mg/dL (ref 70–99)
Glucose-Capillary: 91 mg/dL (ref 70–99)

## 2024-03-18 MED ORDER — VANCOMYCIN HCL IN DEXTROSE 1-5 GM/200ML-% IV SOLN
INTRAVENOUS | Status: AC
  Filled 2024-03-18: qty 200

## 2024-03-18 NOTE — Plan of Care (Signed)
" °  Problem: Consults Goal: RH GENERAL PATIENT EDUCATION Description: See Patient Education module for education specifics. Outcome: Progressing Goal: Nutrition Consult-if indicated Outcome: Progressing Goal: Diabetes Guidelines if Diabetic/Glucose > 140 Description: If diabetic or lab glucose is > 140 mg/dl - Initiate Diabetes/Hyperglycemia Guidelines & Document Interventions  Outcome: Progressing   Problem: RH BOWEL ELIMINATION Goal: RH STG MANAGE BOWEL WITH ASSISTANCE Description: STG Manage Bowel with mod I Assistance. Outcome: Progressing Goal: RH STG MANAGE BOWEL W/MEDICATION W/ASSISTANCE Description: STG Manage Bowel with Medication with mod I Assistance. Outcome: Progressing   Problem: RH SAFETY Goal: RH STG ADHERE TO SAFETY PRECAUTIONS W/ASSISTANCE/DEVICE Description: STG Adhere to Safety Precautions With cues Assistance/Device. Outcome: Progressing   Problem: RH PAIN MANAGEMENT Goal: RH STG PAIN MANAGED AT OR BELOW PT'S PAIN GOAL Description: Pain < 4 with prns Outcome: Progressing   Problem: RH KNOWLEDGE DEFICIT GENERAL Goal: RH STG INCREASE KNOWLEDGE OF SELF CARE AFTER HOSPITALIZATION Description: Patient and son will be able to manage care at discharge using educational resources independently Outcome: Progressing   "

## 2024-03-18 NOTE — Progress Notes (Signed)
 "                                                        PROGRESS NOTE   Subjective/Complaints:  Pt and I discussed xray results of R shoulder and humerus- has lucency that's likely due to infection that is being treated.   Had a sharp pain overnight that woke him up- in R upper humerus.  But overall pain hasn't been as bad.   LBM yesterday.   Said down to 101 kg- was 103 kg  ROS:  Per HPI   Pt denies SOB, abd pain, CP, N/V/C/D, and vision changes   (+) R shoulder pain   Objective:   DG Humerus Right Result Date: 03/17/2024 CLINICAL DATA:  Right arm and shoulder pain, recent arthroscopic I and D of right shoulder EXAM: RIGHT HUMERUS - 2+ VIEW; RIGHT SHOULDER - 2+ VIEW COMPARISON:  02/07/2024, 09/12/2023 FINDINGS: Right shoulder: Internal rotation, external rotation, and transscapular views of the right shoulder are obtained. Right shoulder arthroplasty is again identified, with lucency surrounding the humeral component compatible with known infection. Alignment appears grossly stable. No evidence of acute fracture. Soft tissues are unremarkable. Visualized portions of the right chest are clear. Right humerus: Frontal and lateral views are obtained. Lucency surrounding the humeral component of the right shoulder arthroplasty compatible with known infection. There are no acute fractures. Alignment of the right elbow appears anatomic. Soft tissues are unremarkable. IMPRESSION: 1. Persistent lucency surrounding the humeral component of the right reverse total shoulder arthroplasty, compatible with known infection. 2. No evidence of acute fracture. Electronically Signed   By: Ozell Daring M.D.   On: 03/17/2024 17:55   DG Shoulder Right Result Date: 03/17/2024 CLINICAL DATA:  Right arm and shoulder pain, recent arthroscopic I and D of right shoulder EXAM: RIGHT HUMERUS - 2+ VIEW; RIGHT SHOULDER - 2+ VIEW COMPARISON:  02/07/2024, 09/12/2023 FINDINGS: Right shoulder: Internal rotation, external  rotation, and transscapular views of the right shoulder are obtained. Right shoulder arthroplasty is again identified, with lucency surrounding the humeral component compatible with known infection. Alignment appears grossly stable. No evidence of acute fracture. Soft tissues are unremarkable. Visualized portions of the right chest are clear. Right humerus: Frontal and lateral views are obtained. Lucency surrounding the humeral component of the right shoulder arthroplasty compatible with known infection. There are no acute fractures. Alignment of the right elbow appears anatomic. Soft tissues are unremarkable. IMPRESSION: 1. Persistent lucency surrounding the humeral component of the right reverse total shoulder arthroplasty, compatible with known infection. 2. No evidence of acute fracture. Electronically Signed   By: Ozell Daring M.D.   On: 03/17/2024 17:55   Recent Labs    03/16/24 0413  WBC 7.4  HGB 7.0*  HCT 21.9*  PLT 354   Recent Labs    03/16/24 0413  NA 132*  K 4.6  CL 90*  CO2 27  GLUCOSE 80  BUN 67*  CREATININE 7.62*  CALCIUM  8.8*    Intake/Output Summary (Last 24 hours) at 03/18/2024 0854 Last data filed at 03/18/2024 0800 Gross per 24 hour  Intake 210 ml  Output --  Net 210 ml     Wound 03/03/24 0330 Pressure Injury Sacrum Left Unstageable - Full thickness tissue loss in which the base of the injury is covered by slough (  yellow, tan, gray, green or brown) and/or eschar (tan, brown or black) in the wound bed. (Active)    Physical Exam: Vital Signs Blood pressure 114/71, pulse 93, temperature 97.8 F (36.6 C), temperature source Oral, resp. rate 17, height 6' 2 (1.88 m), weight 101.1 kg, SpO2 100%.      General: awake, alert, appropriate, sitting up at EOB; balancing without arms;  NAD HENT: conjugate gaze; oropharynx always dry due to FR CV: regular rate and rhythm; no JVD Pulmonary: CTA B/L; no W/R/R- good air movement GI: soft, NT, ND, (+)BS-  normoactive Psychiatric: appropriate- bright affect Neurological: Ox3  Extremities: R knee is 1-2+ edema B/L  is improving- but incision healing on R knee and R shoulder- R upper humerus midly TTP Skin: scalp with dry skin, denuded area where he's scratched. Slightly larger than dollar sized coin- Healing surgical wounds on right shoulder and knee c/d/I, sutures intact at R shoulder wounds. Looks good Sacral wound as below-- not reassessed   PRIOR EXAMS: Neuro: A&O x3, follows commands, normal speech and migraines, cranial nerves II to XII grossly intact  PRIOR EXAMS: Neuro:  Alert and oriented x 3. Normal insight and awareness. Intact Memory. Normal language and speech. Cranial nerve exam unremarkable. MMT: LUE 5/5/ RUE limited proximally due to pain but 5/5 distally. RLE 2/5 HF,KE pain, 4+ ankle. LLE 4/5 prox to 5/5 distal. Decreased LT in feet.   Musculoskeletal: right knee with swelling,  tender. Right shoulder  tender.     Assessment/Plan: 1. Functional deficits which require 3+ hours per day of interdisciplinary therapy in a comprehensive inpatient rehab setting. Physiatrist is providing close team supervision and 24 hour management of active medical problems listed below. Physiatrist and rehab team continue to assess barriers to discharge/monitor patient progress toward functional and medical goals  Care Tool:  Bathing              Bathing assist Assist Level: Set up assist     Upper Body Dressing/Undressing Upper body dressing        Upper body assist Assist Level: Set up assist    Lower Body Dressing/Undressing Lower body dressing            Lower body assist Assist for lower body dressing: Moderate Assistance - Patient 50 - 74%     Toileting Toileting    Toileting assist Assist for toileting: 2 Helpers     Transfers Chair/bed transfer  Transfers assist     Chair/bed transfer assist level: Contact Guard/Touching assist      Locomotion Ambulation   Ambulation assist   Ambulation activity did not occur: Safety/medical concerns          Walk 10 feet activity   Assist  Walk 10 feet activity did not occur: Safety/medical concerns        Walk 50 feet activity   Assist Walk 50 feet with 2 turns activity did not occur: Safety/medical concerns         Walk 150 feet activity   Assist Walk 150 feet activity did not occur: Safety/medical concerns         Walk 10 feet on uneven surface  activity   Assist Walk 10 feet on uneven surfaces activity did not occur: Safety/medical concerns         Wheelchair     Assist Is the patient using a wheelchair?: Yes Type of Wheelchair: Power    Wheelchair assist level: Independent Max wheelchair distance: 150'    Wheelchair 50  feet with 2 turns activity    Assist        Assist Level: Independent   Wheelchair 150 feet activity     Assist      Assist Level: Independent   Blood pressure 114/71, pulse 93, temperature 97.8 F (36.6 C), temperature source Oral, resp. rate 17, height 6' 2 (1.88 m), weight 101.1 kg, SpO2 100%.  Medical Problem List and Plan: 1. Functional deficits secondary to septic right knee and shoulder             -patient may shower if incisons are covered             -ELOS/Goals: 15-20 days, min assist goals with PT and OT, W/C level?  D/c date 2/12 D/w therapy- will need regular standard manual w/c at d/c- won't meet criteria for specialized w/c, esp power w/c.  Con't CIR PT and OT HD today 2.  Antithrombotics:  -DVT/anticoagulation:  Pharmaceutical: Heparin  5kU q8h due to ESRD 1/28- advised pt to take his Heparin  since Plavix  doesn't cover DVT prophylaxis 2/3- pt had refused heparin  again yesterday during day-              -antiplatelet therapy: ASA/Plavix  daily  3. Pain Management: tylenol  prn, oxycodone  prn. Lidoderm  patches.  -pt will take oxycodone  prior to therapies based on his  schedule. I won't schedule it  for now.   1/26- 1/27- Pain meds prn working for him- con't regimen  1/30- 2/2 pain controlled majority of time- con't regimen  2/4- still intermittent pain in R upper humerus and R knee- but improving 4. Mood/Behavior/Sleep:  LCSW to follow for evaluation and support.              -antipsychotic agents: N/A  -Melatonin PRN  5. Neuropsych/cognition: This patient is capable of making decisions on his own behalf.  6. Skin/Wound Care: surgical incisions CDI, healing  -air mattress for sacral wound, WOC. Santyl  to area of necrotic tissue  -have discussed importance of nutrition with pt  -medicated shampoo for scalp was helpful -03/07/24 pt inquiring about sutures R shoulder-- looks like surgery was 1/16, weekday team to touch base with ortho regarding timing of removal but likely this coming week.   1/26- can likely remove sutures 1/30  1/28- Pt on regular bed- didn't like air mattress  1/30 sutures out today-- ordered again 1/31, RN aware  7. Fluids/Electrolytes/Nutrition: Strict I/O. Renal diet w/1200 cc/FR. Continue vitamins/supplements. Continue Protonix  40mg  BID  -labs reviewed, stable  8.  Septic arthritis R-knee/R-shoulder: To continue Cefepime  and Vanc with HD thru 04/10/24             --shoulder to be aspirated in 8 weeks per Dr. [name]?  2/3- will get xray of R shoulder per pt request and upper humerus- due to increased pain  9.  Chronic hypotension: On midodrine  10mg  TID with prn doses. Stable  -1/25 BP stable overall continue current regimen and monitor  1/26- 1/27- BP slightly soft, but denies symptoms  2/4- had BP yesterday that was <100 - 94/54 - but asymptomatic  Vitals:   03/16/24 1630 03/16/24 1700 03/16/24 1730 03/16/24 1800  BP: 137/75 133/77 135/75 128/74   03/16/24 1830 03/16/24 1851 03/16/24 1857 03/16/24 2003  BP: 124/71 113/72 126/75 128/81   03/17/24 0555 03/17/24 1528 03/17/24 2044 03/18/24 0619  BP: 116/70 (!) 94/54 (!) 99/56  114/71     10. ESRD: HD MWF continue at the end of the day to help with tolerance  of therapy. Continue Cinacalcet  and Sevelamer              --chronic hyponatremia managed with HD/FR  -have requested a piece of bacon or sausage on breakfast plate  -8/75/73 dialysis delayed to today (Sat) d/t census  -1/25 reviewed nephrology note, next HD tomorrow. 1/30- getting HD today per his schedule-- Dr. Melia tried to convince pt 1/31 to do extra dialysis d/t higher EDW but pt refused; appreciate nephro assistance  2/4- HD today- using 101 kg now to do HD 11. T2DM: A1C- 5.1. Will continue to monitor BS ac/hs and if controlled decrease to twice a day  1/26- will reduce CBGs and SSI to BID-AC  1/30-2/3 look great- cont' regimen  CBG (last 3)  Recent Labs    03/17/24 0555 03/17/24 1648 03/18/24 0621  GLUCAP 76 101* 83     12. Anemia of chronic disease: Jehovah's witness and refuses blood transfusion. Hgb ~8, continue Aranesp  and iron supplement and folic acid  -1/25 hemoglobin yesterday down to 6.6, he denies any bleeding.  Will check occult stool test.  Nephrology planning for high-dose EPO.  Nephrology ordered iron studies today and considering IV iron.  Patient declined blood transfusion, reports he understands risks.  1/26- labs pending- iron studies today as well  1/27- Given Aranesp  and Iron in HD yesterday along with IV ABX- Hb was 6.3! Will recheck Thursday- but not daily, or will drain him more.  1/28- pt thought had labs in HD yesterday- did not, so did go over the Hb of 6.3 with him. On Aranesp  and PO/IV iron  1/29- Hb was 6.4 yesterday afternoon; 6.3 today! Went back and let pt know  1/30- Hgb 6.3, stable  2/2- Hb up to 7.0- will let pt know 13. L suppurative Otitis media with TM perforation: Has completed antibiotic course.  --Follow up with [name] for evaluation. -Getting debrox for R ear cerumen impaction, DO NOT USE IN LEFT EAR  14. Cervical myelopathy: Gabapentin  300mg  nightly  for neuropathic pain  15. PAD s/p R-SFA/popliteal lithotripsy/Stent: On ASA, Plavix  and Lipitor 10mg  daily    16. Constipation: continue Colace 100mg  BID, psyllium packet daily  -03/15/24 LBM yesterday, cont regimen  2/3- said going regularly, but computer doesn't show this- will d/w pt a little more?  2/4- Pt reports LBM yesterday, but chart shows 2/1- sounds like going with therapy.    I spent a total of  38  minutes on total care today- >50% coordination of care- due to  D/w pt about his xrays which I went over independently; also review of vitals and B/B- esp since not documented per pt.   LOS: 15 days A FACE TO FACE EVALUATION WAS PERFORMED  Jeniece Hannis 03/18/2024, 8:54 AM     "

## 2024-03-18 NOTE — Procedures (Signed)
 HD Note:  Some information was entered later than the data was gathered due to patient care needs. The stated time with the data is accurate.  Received patient in bed to unit.   Alert and oriented.   Informed consent signed and in chart.   Access used: upper right arm fistula Access issues: None  Patient tolerated treatment well.   TX duration: 3 hours  Alert, without acute distress.  Total UF removed: 3500 ml  Hand-off given to patient's nurse.   Transported back to the room   Hernan Turnage L. Lenon, RN [facility].

## 2024-03-18 NOTE — Progress Notes (Signed)
 Occupational Therapy Session Note  Patient Details  Name: [name]. MRN: [id number] Date of Birth: December 27, 1960  Today's Date: 03/18/2024 OT Individual Time: 0800-0900 & 1100-1157 OT Individual Time Calculation (min): 60 min & 57 min    Short Term Goals: Week 2:  OT Short Term Goal 1 (Week 2): Pt will complete toileting with Min A overall OT Short Term Goal 2 (Week 2): Pt will complete ADL LB dressing with CGA with AE as necessary OT Short Term Goal 3 (Week 2): Pt will complete ADL activity in standing for 2< min at [facility] with LRAD  Skilled Therapeutic Interventions/Progress Updates:      Therapy Documentation Precautions:  Precautions Precautions: Fall Precaution/Restrictions Comments: watch HR, R knee buckles, spinal Restrictions Weight Bearing Restrictions Per Provider Order: No RUE Weight Bearing Per Provider Order: Weight bearing as tolerated RLE Weight Bearing Per Provider Order: Weight bearing as tolerated Other Position/Activity Restrictions: Per Dr. [name] on 1/19 pt can perform ROM and WB through RUE as tolerated Session 1 General:  Pt seated in EOB upon OT arrival, agreeable to OT.  Pain:  2/10 pain reported in RUE/RLE, activity, intermittent rest breaks, distractions provided for pain management, pt reports tolerable to proceed. Pt reporting taking pain meds this AM to see if it helps.   ADL: OT providing skilled intervention on ADL retraining in order to increase independence with tasks and increase activity tolerance. Pt completed the following tasks at the current level of assist: Grooming/oral hygiene: Toilet transfer: SBA to stand to stedy from bed, Min A from lower BSC  Toileting: SBA overall, able to manage pants over waist in standing + managing hygiene after BM Footwear: SBA with ++ time, able to anterior lean to put shoes on and tie them   Exercises: Pt completed 10 minutes of arm bike, 5 min forward, 5 min backward in order to increase BUE/BLEstrength  and endurance in preparation for increased independence in ADLs such as functional transfers. No rest break required, slow pacing, on hills mode resistance from 2-5.  OT providing skilled intervention with UE strength and endurance. Pt participating in ball toss with bringing BUE into shoulder flexion to bounce ball onto floor and to OT to increase ROM and force created by BUE.    Pt seated in W/C at end of session with call light within reach and 4Ps assessed.    Session 2 General: Pt seated in W/C upon OT arrival, agreeable to OT.  Pain:  7/10 pain reported in Rt knee, activity, intermittent rest breaks, distractions provided for pain management, pt reports tolerable to proceed.   Exercises: OT providing skilled intervention with BLE strength and endurance with functional transfers. Pt completing sit to stands with Mod A from lower chair level and OT providing Rt knee block for stability as well as ACE wrap to provide stability. Pt able to ambulate 4 steps forward/4 steps backward with RW with Min A with OT providing knee block for increased stability. Pt completed multiple trials with rest breaks in between.   Pt seated in W/C at end of session with W/C alarm donned, call light within reach and 4Ps assessed.    Therapy/Group: Individual Therapy  Camie Hoe, OTD, OTR/L 03/18/2024, 12:23 PM

## 2024-03-18 NOTE — Progress Notes (Signed)
 " [facility] Progress Note   Subjective:   Seen in room. Feels well. Making progress with therapy. More steps today. Dialysis today.   Objective Vitals:   03/18/24 1348 03/18/24 1408 03/18/24 1430 03/18/24 1500  BP: 113/66 118/71 102/66   Pulse: 82 77 75 87  Resp:  14    Temp: 98.1 F (36.7 C) 98.1 F (36.7 C)    TempSrc:      SpO2: 100% 100% 100% 100%  Weight: 106 kg 104.3 kg 104.3 kg   Height:       Physical Exam General: Alert, nad Heart: RRR, no murmur Lungs: Clear, respirations unlabored on RA Abdomen: Non-tender  Extremities: 1+ edema b/l lower extremities, weakness on right side Dialysis Access:  Rt Cimino + T/b  Additional Objective Labs: Basic Metabolic Panel: Recent Labs  Lab 03/12/24 0418 03/13/24 1430 03/16/24 0413  NA 132* 133* 132*  K 3.8 4.0 4.6  CL 92* 92* 90*  CO2 29 27 27   GLUCOSE 125* 116* 80  BUN 34* 50* 67*  CREATININE 4.61* 6.69* 7.62*  CALCIUM  8.8* 9.0 8.8*  PHOS 2.5 3.3 3.5   Liver Function Tests: Recent Labs  Lab 03/12/24 0418 03/13/24 1430 03/16/24 0413  ALBUMIN  3.1* 3.3* 3.2*   No results for input(s): LIPASE, AMYLASE in the last 168 hours. CBC: Recent Labs  Lab 03/12/24 0418 03/13/24 1430 03/16/24 0413  WBC 6.2 9.3 7.4  NEUTROABS 3.6  --  4.2  HGB 6.3* 6.3* 7.0*  HCT 19.8* 19.8* 21.9*  MCV 89.2 90.0 89.8  PLT 343 399 354   Blood Culture    Component Value Date/Time   SDES TISSUE RIGHT SHOULDER 02/28/2024 1636   SPECREQUEST SWAB C INTRA ARTICULAR SUTURE 02/28/2024 1636   CULT  02/28/2024 1636    No growth aerobically or anaerobically. Performed at [facility], [address], [place], [name]    REPTSTATUS 03/04/2024 FINAL 02/28/2024 1636    Cardiac Enzymes: No results for input(s): CKTOTAL, CKMB, CKMBINDEX, TROPONINI in the last 168 hours. CBG: Recent Labs  Lab 03/16/24 2003 03/17/24 0555 03/17/24 1648 03/18/24 0621 03/18/24 1114  GLUCAP 85 76 101* 83 78    Iron Studies:  No results for input(s): IRON, TIBC, TRANSFERRIN, FERRITIN in the last 72 hours.  @lablastinr3 @ Studies/Results: DG Humerus Right Result Date: 03/17/2024 CLINICAL DATA:  Right arm and shoulder pain, recent arthroscopic I and D of right shoulder EXAM: RIGHT HUMERUS - 2+ VIEW; RIGHT SHOULDER - 2+ VIEW COMPARISON:  02/07/2024, 09/12/2023 FINDINGS: Right shoulder: Internal rotation, external rotation, and transscapular views of the right shoulder are obtained. Right shoulder arthroplasty is again identified, with lucency surrounding the humeral component compatible with known infection. Alignment appears grossly stable. No evidence of acute fracture. Soft tissues are unremarkable. Visualized portions of the right chest are clear. Right humerus: Frontal and lateral views are obtained. Lucency surrounding the humeral component of the right shoulder arthroplasty compatible with known infection. There are no acute fractures. Alignment of the right elbow appears anatomic. Soft tissues are unremarkable. IMPRESSION: 1. Persistent lucency surrounding the humeral component of the right reverse total shoulder arthroplasty, compatible with known infection. 2. No evidence of acute fracture. Electronically Signed   By: Ozell Daring M.D.   On: 03/17/2024 17:55   DG Shoulder Right Result Date: 03/17/2024 CLINICAL DATA:  Right arm and shoulder pain, recent arthroscopic I and D of right shoulder EXAM: RIGHT HUMERUS - 2+ VIEW; RIGHT SHOULDER - 2+ VIEW COMPARISON:  02/07/2024, 09/12/2023 FINDINGS: Right shoulder: Internal rotation, external rotation, and transscapular views of the right shoulder are obtained. Right shoulder arthroplasty is again identified, with lucency surrounding the humeral component compatible with known infection. Alignment appears grossly stable. No evidence of acute fracture. Soft tissues are unremarkable. Visualized portions of the right chest are clear. Right humerus: Frontal  and lateral views are obtained. Lucency surrounding the humeral component of the right shoulder arthroplasty compatible with known infection. There are no acute fractures. Alignment of the right elbow appears anatomic. Soft tissues are unremarkable. IMPRESSION: 1. Persistent lucency surrounding the humeral component of the right reverse total shoulder arthroplasty, compatible with known infection. 2. No evidence of acute fracture. Electronically Signed   By: Ozell Daring M.D.   On: 03/17/2024 17:55   Medications:  ceFEPime  (MAXIPIME ) IV 2 g (03/16/24 1738)   vancomycin  1,000 mg (03/16/24 1737)    (feeding supplement) PROSource Plus  30 mL Oral BID BM   aspirin  EC  81 mg Oral Daily   atorvastatin   10 mg Oral Daily   carbamide peroxide  10 drop Right EAR QPC supper   Chlorhexidine  Gluconate Cloth  6 each Topical Q0600   cinacalcet   120 mg Oral Q breakfast   clopidogrel   75 mg Oral Daily   collagenase    Topical Daily   docusate sodium   100 mg Oral BID   epoetin  alfa  20,000 Units Subcutaneous Q M,W,F-HD   ferrous sulfate   325 mg Oral Q breakfast   folic acid   1 mg Oral Daily   gabapentin   300 mg Oral QHS   heparin   5,000 Units Subcutaneous Q8H   hydrocerin   Topical BID   insulin  aspart  0-6 Units Subcutaneous BID AC   lidocaine   1 patch Transdermal Q24H   midodrine   10 mg Oral TID WC   multivitamin  1 tablet Oral QHS   pantoprazole   40 mg Oral BID AC   psyllium  1 packet Oral Daily   selenium  sulfide  1 Application Topical Daily   sevelamer  carbonate  1,600 mg Oral TID WC    Dialysis Orders:  MWF NW 4h  B400  87.3kg  2K bath  AVF   Heparin  3000 ESA: Mircera 50 q 2 wks (last 12/15) BMM: Calcitriol  2 mcg PO q HD, cinacalcet  120 mg daily   Assessment/Plan: ESRD - on HD MWF -> Tolerated dialysis Monday  with 2 L net UF and appears to be very comfortable. Tolerated 3L UF Wed without cramping or any issues. He's supposed to be ~88-89kg and is currently way up.   Trying to get  closer to EDW.  Continue to push UF with HD.   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   Otitis media w/ TM perforation:  - s/p course of abx, ENT has seen  R sided weakness:  - main c/o on admission, CVA w/u was negative   Secondary hyperparathyroidism:  - CorrCa slightly high, VDRA on hold, continue sensipar  - Phos to goal, continue sevelamer  but decr to 2 tabs TIDM 1/29. Phos 3.3 1/31   Nutrition - Continue renal diet and protein supplements   Dispo - In CIR - Anticipated discharge 2/12     Maisie Ronnald Acosta PA-C [facility] 03/18/2024,3:15 PM   "

## 2024-03-18 NOTE — Progress Notes (Signed)
 Physical Therapy Session Note  Patient Details  Name: Carl Jarriel Sr. MRN: [id number] Date of Birth: [birth year]  Today's Date: 03/18/2024 PT Individual Time: 0900-1015 PT Individual Time Calculation (min): 75 min   Short Term Goals: Week 2:  PT Short Term Goal 1 (Week 2): pt will initiate gait training PT Short Term Goal 2 (Week 2): Pt will perform STS from 24 surface with assist PT Short Term Goal 3 (Week 2): Pt will tolerate standing>5 min  Skilled Therapeutic Interventions/Progress Updates: Pt presented in PWC agreeable to therapy. Pt states mild unrated pain in shoulder, no intervention requested at this time. Pt navigated to day room mod I and completed stand step transfer to high/low mat with modA to stand from elevated chair and minA to complete transfer. Pt worked on Sit to stand from 25in height mat x 5 requiring consistent minA and facilitation for anterior weight shifting. Pt noted to have difficulty to complete powering up due to bilateral quad weakness. Pt also worked on pre-gait activities including stepping forward with LLE while PTA blocking R knee. Noted that if provided with very light block/feedback significantly decreased buckling. Pt then progressed to taking x 1 step forward/backwards with PTA providing similar block with good success. Pt then progressed to ambulating ~69ft with minA. PTA present to block R knee with +2 present for PWC follow. Pt requiring cues for RW placement as pt initially tended to keep RW too close. Upon return to [facility] pt navigated to ortho gym and participated in UBE for general conditioning hill level 2-5 x 8 min 4 forward/backwards. Pt navigated back to room at end of session and remained in PWC with call bell within reach and needs met.      Therapy Documentation Precautions:  Precautions Precautions: Fall Precaution/Restrictions Comments: watch HR, R knee buckles, spinal Restrictions Weight Bearing Restrictions Per Provider Order: No RUE  Weight Bearing Per Provider Order: Weight bearing as tolerated RLE Weight Bearing Per Provider Order: Weight bearing as tolerated Other Position/Activity Restrictions: Per Dr. [name] on 1/19 pt can perform ROM and WB through RUE as tolerated   Therapy/Group: Individual Therapy  Evarose Altland 03/18/2024, 4:00 PM

## 2024-03-19 LAB — RENAL FUNCTION PANEL
Albumin: 3.6 g/dL (ref 3.5–5.0)
Anion gap: 13 (ref 5–15)
BUN: 30 mg/dL — ABNORMAL HIGH (ref 8–23)
CO2: 30 mmol/L (ref 22–32)
Calcium: 9.3 mg/dL (ref 8.9–10.3)
Chloride: 92 mmol/L — ABNORMAL LOW (ref 98–111)
Creatinine, Ser: 5.28 mg/dL — ABNORMAL HIGH (ref 0.61–1.24)
GFR, Estimated: 11 mL/min — ABNORMAL LOW
Glucose, Bld: 82 mg/dL (ref 70–99)
Phosphorus: 3 mg/dL (ref 2.5–4.6)
Potassium: 3.9 mmol/L (ref 3.5–5.1)
Sodium: 134 mmol/L — ABNORMAL LOW (ref 135–145)

## 2024-03-19 LAB — CBC WITH DIFFERENTIAL/PLATELET
Abs Immature Granulocytes: 0.03 10*3/uL (ref 0.00–0.07)
Basophils Absolute: 0.2 10*3/uL — ABNORMAL HIGH (ref 0.0–0.1)
Basophils Relative: 3 %
Eosinophils Absolute: 1.7 10*3/uL — ABNORMAL HIGH (ref 0.0–0.5)
Eosinophils Relative: 24 %
HCT: 26.2 % — ABNORMAL LOW (ref 39.0–52.0)
Hemoglobin: 8.2 g/dL — ABNORMAL LOW (ref 13.0–17.0)
Immature Granulocytes: 0 %
Lymphocytes Relative: 15 %
Lymphs Abs: 1 10*3/uL (ref 0.7–4.0)
MCH: 28.8 pg (ref 26.0–34.0)
MCHC: 31.3 g/dL (ref 30.0–36.0)
MCV: 91.9 fL (ref 80.0–100.0)
Monocytes Absolute: 1 10*3/uL (ref 0.1–1.0)
Monocytes Relative: 14 %
Neutro Abs: 3.2 10*3/uL (ref 1.7–7.7)
Neutrophils Relative %: 44 %
Platelets: 348 10*3/uL (ref 150–400)
RBC: 2.85 MIL/uL — ABNORMAL LOW (ref 4.22–5.81)
RDW: 18.2 % — ABNORMAL HIGH (ref 11.5–15.5)
WBC: 7.1 10*3/uL (ref 4.0–10.5)
nRBC: 0 % (ref 0.0–0.2)

## 2024-03-19 LAB — GLUCOSE, CAPILLARY
Glucose-Capillary: 77 mg/dL (ref 70–99)
Glucose-Capillary: 86 mg/dL (ref 70–99)
Glucose-Capillary: 96 mg/dL (ref 70–99)

## 2024-03-19 MED ORDER — LEVOFLOXACIN 750 MG PO TABS
750.0000 mg | ORAL_TABLET | Freq: Once | ORAL | Status: AC
  Administered 2024-03-19: 750 mg via ORAL
  Filled 2024-03-19: qty 1

## 2024-03-19 MED ORDER — CHLORHEXIDINE GLUCONATE CLOTH 2 % EX PADS: 6.0000 | MEDICATED_PAD | Freq: Every day | CUTANEOUS | Status: AC

## 2024-03-19 MED ORDER — SODIUM CHLORIDE 0.9 % IV SOLN
2.0000 g | Freq: Once | INTRAVENOUS | Status: DC
  Filled 2024-03-19: qty 12.5

## 2024-03-19 NOTE — Progress Notes (Signed)
 Occupational Therapy Session Note  Patient Details  Name: Carl Mccanless Sr. MRN: [id number] Date of Birth: 1960-10-27  Today's Date: 03/19/2024 OT Individual Time: 9199-9084 & 8699-8654 OT Individual Time Calculation (min): 75 min & 45 min   Short Term Goals: Week 2:  OT Short Term Goal 1 (Week 2): Pt will complete toileting with Min A overall OT Short Term Goal 2 (Week 2): Pt will complete ADL LB dressing with CGA with AE as necessary OT Short Term Goal 3 (Week 2): Pt will complete ADL activity in standing for 2< min at [facility] with LRAD  Skilled Therapeutic Interventions/Progress Updates:      Therapy Documentation Precautions:  Precautions Precautions: Fall Precaution/Restrictions Comments: watch HR, R knee buckles, spinal Restrictions Weight Bearing Restrictions Per Provider Order: No RUE Weight Bearing Per Provider Order: Weight bearing as tolerated RLE Weight Bearing Per Provider Order: Weight bearing as tolerated Other Position/Activity Restrictions: Per Dr. [name] on 1/19 pt can perform ROM and WB through RUE as tolerated Session 1 General: Pt seated in W/C upon OT arrival, agreeable to OT.  Pain:  4-5/10 pain reported in Rt knee, activity, intermittent rest breaks, distractions provided for pain management, pt reports tolerable to proceed.   ADL: OT providing skilled intervention on ADL retraining in order to increase independence with tasks and increase activity tolerance. Pt completed the following tasks at the current level of assist: Grooming/oral hygiene: set up for oral hygiene and denture care as well as washing face Footwear: SBA seated in W/C, able to anterior lean with good safety awareness and don/tie shoes   Exercises: OT providing skilled intervention with functional mobility within // bars. Pt SBA to stand within // bars with increased time. Pt completing multiple trials of walking up/down // bars, pt requiring Min A d/t providing light knee block for decreased  buckling for Rt knee.   Other Treatments: OT applied KT tape to Lt knee in order to provide stability and decrease pain with use. Pt educated on risks and benefits of tape.  Pt seated in W/C at end of session with call light within reach and 4Ps assessed.   Session 2 General: Pt seated in W/C upon OT arrival, agreeable to OT.  Pain:  6/10 pain reported in Rt knee, activity, intermittent rest breaks, distractions provided for pain management, pt reports tolerable to proceed.   Exercises: Pt completed the following exercise circuit in order to improve functional activity, strength and endurance to prepare for ADLs such as bathing. Pt completed the following exercises in seated position with no noted LOB/SOB and 3x10 repetitions on each exercise: -bicep curls with 9# dowel rod -triceps extensions with 5# dumbbell  -shoulder abduction with body weight and AAROM -chest press -shoulder flexion   Pt seated in W/C at end of session with W/C alarm donned, call light within reach and 4Ps assessed.    Therapy/Group: Individual Therapy  Camie Hoe, OTD, OTR/L 03/19/2024, 3:58 PM

## 2024-03-19 NOTE — Progress Notes (Signed)
 Physical Therapy Session Note  Patient Details  Name: Carl Busic Sr. MRN: [id number] Date of Birth: 1960/03/31  Today's Date: 03/19/2024 PT Individual Time: 0945-1100 PT Individual Time Calculation (min): 75 min   Short Term Goals: Week 2:  PT Short Term Goal 1 (Week 2): pt will initiate gait training PT Short Term Goal 2 (Week 2): Pt will perform STS from 24 surface with assist PT Short Term Goal 3 (Week 2): Pt will tolerate standing>5 min  Skilled Therapeutic Interventions/Progress Updates: Pt presented in PWC agreeable to therapy. Pt navigated mod I to main gym. Completed stand step transfer to high/low mat with minA to assist powering up to stand. Participated in kicks with 2lb weight on LLE 2 x 20. Pt also also to perform seated marches with 2lb weight on LLE. Pt worked on standing TKE with PTA noting fair quad activation 2 x 15. During seated rest PTA performed passive hamstring/knee extension stretch with gentle overpressure for additional stretch. Pt noted to reach near full extension when stood again, worked on weight shifting with PTA blocking L knee while pt took forward/backward steps with RLE. Required cues for engaging L quad to minimize buckling. During seated rest break pt worked on core strengthening activities including modified sit ups with 4lb weighted ball, trunk rotations with 4lb weighted ball, and chest press with 4lb weighted ball. Pt then worked on investment banker, operational with PWC follow. Pt stood from 24in mat with modA and ambulated 22ft with RW and minA. PTA blocking R knee and providing max multimodal cues for trying to maintain TKE in stance phase. Once completed pt reutrned to [facility] and navigated to day room. Completed stand step transfer to NuStep with minA overall. Pt participated in NuStep L4 x 6 min for general conditioning. Pt able to maintain 50 SPM however was unable to complete more due to increased pain in RUE. At highest level NuStep seat 23.5in, pt required modA x 2  for stand from NuStep and completed stand step transfer to [facility] with minA. Pt navigated back to room at end of session and remained in PWC at end of session with call bell within reach and needs met.      Therapy Documentation Precautions:  Precautions Precautions: Fall Precaution/Restrictions Comments: watch HR, R knee buckles, spinal Restrictions Weight Bearing Restrictions Per Provider Order: No RUE Weight Bearing Per Provider Order: Weight bearing as tolerated RLE Weight Bearing Per Provider Order: Weight bearing as tolerated Other Position/Activity Restrictions: Per Dr. [name] on 1/19 pt can perform ROM and WB through RUE as tolerated   Therapy/Group: Individual Therapy  Jayel Inks 03/19/2024, 12:28 PM

## 2024-03-19 NOTE — Plan of Care (Signed)
" °  Problem: Consults Goal: RH GENERAL PATIENT EDUCATION Description: See Patient Education module for education specifics. Outcome: Progressing Goal: Diabetes Guidelines if Diabetic/Glucose > 140 Description: If diabetic or lab glucose is > 140 mg/dl - Initiate Diabetes/Hyperglycemia Guidelines & Document Interventions  Outcome: Progressing   Problem: RH BOWEL ELIMINATION Goal: RH STG MANAGE BOWEL WITH ASSISTANCE Description: STG Manage Bowel with mod I Assistance. Outcome: Progressing Goal: RH STG MANAGE BOWEL W/MEDICATION W/ASSISTANCE Description: STG Manage Bowel with Medication with mod I Assistance. Outcome: Progressing   Problem: RH SAFETY Goal: RH STG ADHERE TO SAFETY PRECAUTIONS W/ASSISTANCE/DEVICE Description: STG Adhere to Safety Precautions With cues Assistance/Device. Outcome: Progressing   Problem: RH PAIN MANAGEMENT Goal: RH STG PAIN MANAGED AT OR BELOW PT'S PAIN GOAL Description: Pain < 4 with prns Outcome: Progressing   Problem: RH KNOWLEDGE DEFICIT GENERAL Goal: RH STG INCREASE KNOWLEDGE OF SELF CARE AFTER HOSPITALIZATION Description: Patient and son will be able to manage care at discharge using educational resources independently Outcome: Progressing   "

## 2024-03-19 NOTE — Progress Notes (Addendum)
 " [facility] Progress Note   Subjective:    Seen and examined patient at bedside. Tolerated yesterday's HD with net UF 3.5L. Denies SOB, CP, and N/V. Next HD 2/6.  Objective Vitals:   03/18/24 1809 03/18/24 2036 03/19/24 0551 03/19/24 1506  BP: 96/62 (!) 100/55 104/64 (!) 92/58  Pulse: 100 100 86 84  Resp: 20 18 18 18   Temp: 98.1 F (36.7 C) 98.1 F (36.7 C) 98.5 F (36.9 C) 97.7 F (36.5 C)  TempSrc: Oral Oral Oral Oral  SpO2: 100% 100% 100% 99%  Weight:      Height:       Physical Exam General: Alert, NAD Heart: RRR, no murmur Lungs: Clear, respirations unlabored on RA Abdomen: Non-tender  Extremities: 1+ edema b/l lower extremities, weakness on right side Dialysis Access:  Rt AVF + T/b  Filed Weights   03/18/24 1408 03/18/24 1430 03/18/24 1706  Weight: 104.3 kg 104.3 kg 102.4 kg    Intake/Output Summary (Last 24 hours) at 03/19/2024 1619 Last data filed at 03/19/2024 1400 Gross per 24 hour  Intake 413 ml  Output 3500 ml  Net -3087 ml    Additional Objective Labs: Basic Metabolic Panel: Recent Labs  Lab 03/13/24 1430 03/16/24 0413 03/19/24 0523  NA 133* 132* 134*  K 4.0 4.6 3.9  CL 92* 90* 92*  CO2 27 27 30   GLUCOSE 116* 80 82  BUN 50* 67* 30*  CREATININE 6.69* 7.62* 5.28*  CALCIUM  9.0 8.8* 9.3  PHOS 3.3 3.5 3.0   Liver Function Tests: Recent Labs  Lab 03/13/24 1430 03/16/24 0413 03/19/24 0523  ALBUMIN  3.3* 3.2* 3.6   No results for input(s): LIPASE, AMYLASE in the last 168 hours. CBC: Recent Labs  Lab 03/13/24 1430 03/16/24 0413 03/19/24 0523  WBC 9.3 7.4 7.1  NEUTROABS  --  4.2 3.2  HGB 6.3* 7.0* 8.2*  HCT 19.8* 21.9* 26.2*  MCV 90.0 89.8 91.9  PLT 399 354 348   Blood Culture    Component Value Date/Time   SDES TISSUE RIGHT SHOULDER 02/28/2024 1636   SPECREQUEST SWAB C INTRA ARTICULAR SUTURE 02/28/2024 1636   CULT  02/28/2024 1636    No growth aerobically or anaerobically. Performed at [facility]  Lab, [address] [place], [name]    REPTSTATUS 03/04/2024 FINAL 02/28/2024 1636    Cardiac Enzymes: No results for input(s): CKTOTAL, CKMB, CKMBINDEX, TROPONINI in the last 168 hours. CBG: Recent Labs  Lab 03/18/24 0621 03/18/24 1114 03/18/24 1827 03/19/24 0549 03/19/24 1134  GLUCAP 83 78 91 77 86   Iron Studies: No results for input(s): IRON, TIBC, TRANSFERRIN, FERRITIN in the last 72 hours. Lab Results  Component Value Date   INR 1.1 05/27/2023   INR 1.1 05/25/2023   INR 1.1 09/17/2021   Studies/Results: No results found.  Medications:  ceFEPime  (MAXIPIME ) IV 2 g (03/16/24 1738)   vancomycin  Stopped (03/18/24 1749)    (feeding supplement) PROSource Plus  30 mL Oral BID BM   aspirin  EC  81 mg Oral Daily   atorvastatin   10 mg Oral Daily   carbamide peroxide  10 drop Right EAR QPC supper   Chlorhexidine  Gluconate Cloth  6 each Topical Q0600   cinacalcet   120 mg Oral Q breakfast   clopidogrel   75 mg Oral Daily   collagenase    Topical Daily   docusate sodium   100 mg Oral BID   epoetin  alfa  20,000 Units Subcutaneous Q M,W,F-HD   ferrous sulfate   325 mg Oral Q breakfast   folic acid   1 mg Oral Daily   gabapentin   300 mg Oral QHS   heparin   5,000 Units Subcutaneous Q8H   hydrocerin   Topical BID   insulin  aspart  0-6 Units Subcutaneous BID AC   lidocaine   1 patch Transdermal Q24H   midodrine   10 mg Oral TID WC   multivitamin  1 tablet Oral QHS   pantoprazole   40 mg Oral BID AC   psyllium  1 packet Oral Daily   selenium  sulfide  1 Application Topical Daily   sevelamer  carbonate  1,600 mg Oral TID WC    Dialysis Orders:  MWF NW 4h  B400  87.3kg  2K bath  AVF   Heparin  3000 ESA: Mircera 50 q 2 wks (last 12/15) BMM: Calcitriol  2 mcg PO q HD, cinacalcet  120 mg daily   Assessment/Plan: ESRD - on HD MWF -> Tolerated dialysis Monday  with 2 L net UF and appears to be very comfortable. Tolerated 3.5L UF Wed without cramping or any issues.  He's supposed to be ~88-89kg and is currently way up. Next HD 2/6   Trying to get closer to EDW.  Continue to push UF with HD.    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   Otitis media w/ TM perforation:  - s/p course of abx, ENT has seen   R sided weakness:  - main c/o on admission, CVA w/u was negative   Secondary hyperparathyroidism:  - CorrCa slightly high, VDRA on hold, continue sensipar  - Phos to goal, continue sevelamer  but decr to 2 tabs TIDM 1/29. Phos 3.3 1/31   Nutrition - Continue renal diet and protein supplements   Dispo - In CIR - Anticipated discharge 2/12    Charmaine Piety, NP [facility] 03/19/2024,4:19 PM  LOS: 16 days    "

## 2024-03-19 NOTE — Progress Notes (Signed)
 "                                                        PROGRESS NOTE   Subjective/Complaints:    Pt reports didn't get one of his IV ABX in HD yesterday- and he reports that it was sent with him back to floor- except he's been HD pt for 17+ years and doesn't have IV or vascular access.   Let renal and pharmacy know- pharmacy said would give a PO ABX for try and help cover until HD tomorrow.   Pt reports less R knee swelling- less pain as a result.    ROS:  Per HPI   Pt denies SOB, abd pain, CP, N/V/C/D, and vision changes   (+) R shoulder pain- less R knee pain   Objective:   DG Humerus Right Result Date: 03/17/2024 CLINICAL DATA:  Right arm and shoulder pain, recent arthroscopic I and D of right shoulder EXAM: RIGHT HUMERUS - 2+ VIEW; RIGHT SHOULDER - 2+ VIEW COMPARISON:  02/07/2024, 09/12/2023 FINDINGS: Right shoulder: Internal rotation, external rotation, and transscapular views of the right shoulder are obtained. Right shoulder arthroplasty is again identified, with lucency surrounding the humeral component compatible with known infection. Alignment appears grossly stable. No evidence of acute fracture. Soft tissues are unremarkable. Visualized portions of the right chest are clear. Right humerus: Frontal and lateral views are obtained. Lucency surrounding the humeral component of the right shoulder arthroplasty compatible with known infection. There are no acute fractures. Alignment of the right elbow appears anatomic. Soft tissues are unremarkable. IMPRESSION: 1. Persistent lucency surrounding the humeral component of the right reverse total shoulder arthroplasty, compatible with known infection. 2. No evidence of acute fracture. Electronically Signed   By: Ozell Daring M.D.   On: 03/17/2024 17:55   DG Shoulder Right Result Date: 03/17/2024 CLINICAL DATA:  Right arm and shoulder pain, recent arthroscopic I and D of right shoulder EXAM: RIGHT HUMERUS - 2+ VIEW; RIGHT SHOULDER - 2+  VIEW COMPARISON:  02/07/2024, 09/12/2023 FINDINGS: Right shoulder: Internal rotation, external rotation, and transscapular views of the right shoulder are obtained. Right shoulder arthroplasty is again identified, with lucency surrounding the humeral component compatible with known infection. Alignment appears grossly stable. No evidence of acute fracture. Soft tissues are unremarkable. Visualized portions of the right chest are clear. Right humerus: Frontal and lateral views are obtained. Lucency surrounding the humeral component of the right shoulder arthroplasty compatible with known infection. There are no acute fractures. Alignment of the right elbow appears anatomic. Soft tissues are unremarkable. IMPRESSION: 1. Persistent lucency surrounding the humeral component of the right reverse total shoulder arthroplasty, compatible with known infection. 2. No evidence of acute fracture. Electronically Signed   By: Ozell Daring M.D.   On: 03/17/2024 17:55   Recent Labs    03/19/24 0523  WBC 7.1  HGB 8.2*  HCT 26.2*  PLT 348   Recent Labs    03/19/24 0523  NA 134*  K 3.9  CL 92*  CO2 30  GLUCOSE 82  BUN 30*  CREATININE 5.28*  CALCIUM  9.3    Intake/Output Summary (Last 24 hours) at 03/19/2024 1039 Last data filed at 03/19/2024 0900 Gross per 24 hour  Intake 236 ml  Output 3500 ml  Net -3264 ml  Wound 03/03/24 0330 Pressure Injury Sacrum Left Unstageable - Full thickness tissue loss in which the base of the injury is covered by slough (yellow, tan, gray, green or brown) and/or eschar (tan, brown or black) in the wound bed. (Active)    Physical Exam: Vital Signs Blood pressure 104/64, pulse 86, temperature 98.5 F (36.9 C), temperature source Oral, resp. rate 18, height 6' 2 (1.88 m), weight 102.4 kg, SpO2 100%.       General: awake, alert, appropriate, pt sitting EOB changing pants; NAD HENT: conjugate gaze; oropharynx dry CV: regular rate and rhythm; no JVD Pulmonary: CTA  B/L; no W/R/R- good air movement GI: soft, NT, ND, (+)BS Psychiatric: appropriate Neurological: Ox3 Extremities- R knee looks less swollen compared to LLE  Extremities: R knee is 1+ edema B/L  - looks better- is improving- but incision healing on R knee and R shoulder- R upper humerus midly TTP Skin: scalp with dry skin, denuded area where he's scratched. Slightly larger than dollar sized coin- Healing surgical wounds on right shoulder and knee c/d/I, sutures intact at R shoulder wounds. Looks good Sacral wound as below-- not reassessed   PRIOR EXAMS: Neuro: A&O x3, follows commands, normal speech and migraines, cranial nerves II to XII grossly intact  PRIOR EXAMS: Neuro:  Alert and oriented x 3. Normal insight and awareness. Intact Memory. Normal language and speech. Cranial nerve exam unremarkable. MMT: LUE 5/5/ RUE limited proximally due to pain but 5/5 distally. RLE 2/5 HF,KE pain, 4+ ankle. LLE 4/5 prox to 5/5 distal. Decreased LT in feet.   Musculoskeletal: right knee with swelling,  tender. Right shoulder  tender.     Assessment/Plan: 1. Functional deficits which require 3+ hours per day of interdisciplinary therapy in a comprehensive inpatient rehab setting. Physiatrist is providing close team supervision and 24 hour management of active medical problems listed below. Physiatrist and rehab team continue to assess barriers to discharge/monitor patient progress toward functional and medical goals  Care Tool:  Bathing              Bathing assist Assist Level: Set up assist     Upper Body Dressing/Undressing Upper body dressing        Upper body assist Assist Level: Set up assist    Lower Body Dressing/Undressing Lower body dressing            Lower body assist Assist for lower body dressing: Moderate Assistance - Patient 50 - 74%     Toileting Toileting    Toileting assist Assist for toileting: 2 Helpers     Transfers Chair/bed transfer  Transfers  assist     Chair/bed transfer assist level: Contact Guard/Touching assist     Locomotion Ambulation   Ambulation assist   Ambulation activity did not occur: Safety/medical concerns  Assist level: Minimal Assistance - Patient > 75% Assistive device: Walker-rolling Max distance: 12   Walk 10 feet activity   Assist  Walk 10 feet activity did not occur: Safety/medical concerns  Assist level: Minimal Assistance - Patient > 75%     Walk 50 feet activity   Assist Walk 50 feet with 2 turns activity did not occur: Safety/medical concerns         Walk 150 feet activity   Assist Walk 150 feet activity did not occur: Safety/medical concerns         Walk 10 feet on uneven surface  activity   Assist Walk 10 feet on uneven surfaces activity did not occur: Safety/medical concerns  Wheelchair     Assist Is the patient using a wheelchair?: Yes Type of Wheelchair: Power    Wheelchair assist level: Independent Max wheelchair distance: 150'    Wheelchair 50 feet with 2 turns activity    Assist        Assist Level: Independent   Wheelchair 150 feet activity     Assist      Assist Level: Independent   Blood pressure 104/64, pulse 86, temperature 98.5 F (36.9 C), temperature source Oral, resp. rate 18, height 6' 2 (1.88 m), weight 102.4 kg, SpO2 100%.  Medical Problem List and Plan: 1. Functional deficits secondary to septic right knee and shoulder             -patient may shower if incisons are covered             -ELOS/Goals: 15-20 days, min assist goals with PT and OT, W/C level?  D/c date 2/12 D/w therapy- will need regular standard manual w/c at d/c- won't meet criteria for specialized w/c, esp power w/c.  Con't CIR PT and OT D/w pharmacy and renal about lack of IV ABX last night- given another dose of Levaquin  per pharmacy.  2.  Antithrombotics:  -DVT/anticoagulation:  Pharmaceutical: Heparin  5kU q8h due to ESRD 1/28-  advised pt to take his Heparin  since Plavix  doesn't cover DVT prophylaxis 2/3- pt had refused heparin  again yesterday during day-              -antiplatelet therapy: ASA/Plavix  daily  3. Pain Management: tylenol  prn, oxycodone  prn. Lidoderm  patches.  -pt will take oxycodone  prior to therapies based on his schedule. I won't schedule it  for now.   1/26- 1/27- Pain meds prn working for him- con't regimen  1/30- 2/2 pain controlled majority of time- con't regimen  2/4-2/5-  still intermittent pain in R upper humerus and R knee- but improving- swelling improved today 4. Mood/Behavior/Sleep:  LCSW to follow for evaluation and support.              -antipsychotic agents: N/A  -Melatonin PRN  5. Neuropsych/cognition: This patient is capable of making decisions on his own behalf.  6. Skin/Wound Care: surgical incisions CDI, healing  -air mattress for sacral wound, WOC. Santyl  to area of necrotic tissue  -have discussed importance of nutrition with pt  -medicated shampoo for scalp was helpful -03/07/24 pt inquiring about sutures R shoulder-- looks like surgery was 1/16, weekday team to touch base with ortho regarding timing of removal but likely this coming week.   1/26- can likely remove sutures 1/30  1/28- Pt on regular bed- didn't like air mattress  1/30 sutures out today-- ordered again 1/31, RN aware  7. Fluids/Electrolytes/Nutrition: Strict I/O. Renal diet w/1200 cc/FR. Continue vitamins/supplements. Continue Protonix  40mg  BID  -labs reviewed, stable  8.  Septic arthritis R-knee/R-shoulder: To continue Cefepime  and Vanc with HD thru 04/10/24             --shoulder to be aspirated in 8 weeks per Dr. [name]?  2/3- will get xray of R shoulder per pt request and upper humerus- due to increased pain  9.  Chronic hypotension: On midodrine  10mg  TID with prn doses. Stable  -1/25 BP stable overall continue current regimen and monitor  1/26- 1/27- BP slightly soft, but denies symptoms  2/4- had BP  yesterday that was <100 - 94/54 - but asymptomatic   2/5- BP dropped to 96/59- at lowest yesterday - lower than usual- however Hb up  to 8.2- so doing better- will monitor Vitals:   03/18/24 1358 03/18/24 1408 03/18/24 1418 03/18/24 1430  BP: 110/68 118/71 109/68 102/66   03/18/24 1530 03/18/24 1600 03/18/24 1700 03/18/24 1706  BP: 96/68 115/71 92/62 91/63    03/18/24 1721 03/18/24 1809 03/18/24 2036 03/19/24 0551  BP: (!) 96/59 96/62 (!) 100/55 104/64     10. ESRD: HD MWF continue at the end of the day to help with tolerance of therapy. Continue Cinacalcet  and Sevelamer              --chronic hyponatremia managed with HD/FR  -have requested a piece of bacon or sausage on breakfast plate  -8/75/73 dialysis delayed to today (Sat) d/t census  -1/25 reviewed nephrology note, next HD tomorrow. 1/30- getting HD today per his schedule-- Dr. Melia tried to convince pt 1/31 to do extra dialysis d/t higher EDW but pt refused; appreciate nephro assistance  2/4- HD today- using 101 kg now to do HD 11. T2DM: A1C- 5.1. Will continue to monitor BS ac/hs and if controlled decrease to twice a day  1/26- will reduce CBGs and SSI to BID-AC  1/30-2/3 look great- cont' regimen  CBG (last 3)  Recent Labs    03/18/24 1114 03/18/24 1827 03/19/24 0549  GLUCAP 78 91 77     12. Anemia of chronic disease: Jehov[employer]'s witness and refuses blood transfusion. Hgb ~8, continue Aranesp  and iron supplement and folic acid  -1/25 hemoglobin yesterday down to 6.6, he denies any bleeding.  Will check occult stool test.  Nephrology planning for high-dose EPO.  Nephrology ordered iron studies today and considering IV iron.  Patient declined blood transfusion, reports he understands risks.  1/26- labs pending- iron studies today as well  1/27- Given Aranesp  and Iron in HD yesterday along with IV ABX- Hb was 6.3! Will recheck Thursday- but not daily, or will drain him more.  1/28- pt thought had labs in HD yesterday- did not, so  did go over the Hb of 6.3 with him. On Aranesp  and PO/IV iron  1/29- Hb was 6.4 yesterday afternoon; 6.3 today! Went back and let pt know  1/30- Hgb 6.3, stable  2/2- Hb up to 7.0- will let pt know  2/5- Hb up to 8.2 13. L suppurative Otitis media with TM perforation: Has completed antibiotic course.  --Follow up with [name] for evaluation. -Getting debrox for R ear cerumen impaction, DO NOT USE IN LEFT EAR  14. Cervical myelopathy: Gabapentin  300mg  nightly for neuropathic pain  15. PAD s/p R-SFA/popliteal lithotripsy/Stent: On ASA, Plavix  and Lipitor 10mg  daily    16. Constipation: continue Colace 100mg  BID, psyllium packet daily  -03/15/24 LBM yesterday, cont regimen  2/3- said going regularly, but computer doesn't show this- will d/w pt a little more?  2/4- Pt reports LBM yesterday, but chart shows 2/1- sounds like going with therapy.     I spent a total of  47  minutes on total care today- >50% coordination of care- due to  D/w team- multiple members about IV ABX- and how to compensate- also d/w pt how to try and avoid in future- to make sure it's given before leaves HD.    LOS: 16 days A FACE TO FACE EVALUATION WAS PERFORMED  [name] 03/19/2024, 10:39 AM     "

## 2024-03-20 LAB — RENAL FUNCTION PANEL
Albumin: 3.6 g/dL (ref 3.5–5.0)
Anion gap: 18 — ABNORMAL HIGH (ref 5–15)
BUN: 50 mg/dL — ABNORMAL HIGH (ref 8–23)
CO2: 24 mmol/L (ref 22–32)
Calcium: 8.9 mg/dL (ref 8.9–10.3)
Chloride: 92 mmol/L — ABNORMAL LOW (ref 98–111)
Creatinine, Ser: 6.94 mg/dL — ABNORMAL HIGH (ref 0.61–1.24)
GFR, Estimated: 8 mL/min — ABNORMAL LOW
Glucose, Bld: 99 mg/dL (ref 70–99)
Phosphorus: 3.9 mg/dL (ref 2.5–4.6)
Potassium: 4.5 mmol/L (ref 3.5–5.1)
Sodium: 135 mmol/L (ref 135–145)

## 2024-03-20 LAB — CBC
HCT: 24.7 % — ABNORMAL LOW (ref 39.0–52.0)
Hemoglobin: 7.7 g/dL — ABNORMAL LOW (ref 13.0–17.0)
MCH: 28.9 pg (ref 26.0–34.0)
MCHC: 31.2 g/dL (ref 30.0–36.0)
MCV: 92.9 fL (ref 80.0–100.0)
Platelets: 357 10*3/uL (ref 150–400)
RBC: 2.66 MIL/uL — ABNORMAL LOW (ref 4.22–5.81)
RDW: 18.7 % — ABNORMAL HIGH (ref 11.5–15.5)
WBC: 9.4 10*3/uL (ref 4.0–10.5)
nRBC: 0 % (ref 0.0–0.2)

## 2024-03-20 LAB — GLUCOSE, CAPILLARY: Glucose-Capillary: 79 mg/dL (ref 70–99)

## 2024-03-20 LAB — VANCOMYCIN, RANDOM: Vancomycin Rm: 26 ug/mL

## 2024-03-20 MED ORDER — VANCOMYCIN HCL IN DEXTROSE 1-5 GM/200ML-% IV SOLN
INTRAVENOUS | Status: AC
  Filled 2024-03-20: qty 200

## 2024-03-20 NOTE — Progress Notes (Signed)
 Occupational Therapy Session Note  Patient Details  Name: Carl Cavins Sr. MRN: [id number] Date of Birth: 1960/05/17  Today's Date: 03/20/2024 OT Individual Time: 9299-9184 OT Individual Time Calculation (min): 75 min    Short Term Goals: Week 2:  OT Short Term Goal 1 (Week 2): Pt will complete toileting with Min A overall OT Short Term Goal 2 (Week 2): Pt will complete ADL LB dressing with CGA with AE as necessary OT Short Term Goal 3 (Week 2): Pt will complete ADL activity in standing for 2< min at [facility] with LRAD  Skilled Therapeutic Interventions/Progress Updates:  Patient agreeable to participate in OT session. Reports 0/10 pain level. Patient received in room sitting in chair. Patient and OT gathered clothes for laundry task. Patient completed LB Dressing doffing and donning from chair with SBA. Patient then finished grooming sitting at sink mod I. Patient finished eating IND with good navigation of power chair. Patient son arrived and asked questions related to patient current levels and compensatory techniques. OT reported on primary OT notes, advised caregiver to ask during caregiver training.  Patient then completed IND navigation in chair to patient laundry room to complete laundry tasks, CGA to min A. Patient then taken to gym to complete kinetron on 30 m/[place] for 7 minutes to increase LE strength related to safety during standing ADLs. Patient then returned to room all needs in reach with son.    Therapy Documentation Precautions:  Precautions Precautions: Fall Precaution/Restrictions Comments: watch HR, R knee buckles, spinal Restrictions Weight Bearing Restrictions Per Provider Order: No RUE Weight Bearing Per Provider Order: Weight bearing as tolerated RLE Weight Bearing Per Provider Order: Weight bearing as tolerated Other Position/Activity Restrictions: Per Dr. [name] on 1/19 pt can perform ROM and WB through RUE as tolerated  Therapy/Group: Individual Therapy  D'mariea L  Antavius Sperbeck 03/20/2024, 7:19 AM

## 2024-03-20 NOTE — Progress Notes (Signed)
 "                                                        PROGRESS NOTE   Subjective/Complaints:    Pt reports worried about HD today and getting IV ABX- we discussed what measures we've taken to make sure gets IV ABX and HD for the foreseeable future.   R knee swelling is better Focused on wanting bacon with meal- will be brought but not til later, so frustrated about that- reviewed chart- it's in the documentation of his orders for renal diet.   ROS:  Per HPI   Pt denies SOB, abd pain, CP, N/V/C/D, and vision changes   (+) R shoulder pain- less R knee pain   Objective:   No results found.  Recent Labs    03/19/24 0523  WBC 7.1  HGB 8.2*  HCT 26.2*  PLT 348   Recent Labs    03/19/24 0523  NA 134*  K 3.9  CL 92*  CO2 30  GLUCOSE 82  BUN 30*  CREATININE 5.28*  CALCIUM  9.3    Intake/Output Summary (Last 24 hours) at 03/20/2024 0846 Last data filed at 03/20/2024 9273 Gross per 24 hour  Intake 893 ml  Output --  Net 893 ml     Wound 03/03/24 0330 Pressure Injury Sacrum Left Unstageable - Full thickness tissue loss in which the base of the injury is covered by slough (yellow, tan, gray, green or brown) and/or eschar (tan, brown or black) in the wound bed. (Active)    Physical Exam: Vital Signs Blood pressure 106/62, pulse 80, temperature 98.3 F (36.8 C), temperature source Oral, resp. rate 16, height 6' 2 (1.88 m), weight 101.1 kg, SpO2 98%.        General: awake, alert, appropriate, sitting up in power w/c with OT in room; NAD HENT: conjugate gaze; oropharynx dry CV: regular rate and rhythm; no JVD Pulmonary: CTA B/L; no W/R/R- good air movement GI: soft, NT, ND, (+)BS- normoactive Psychiatric: appropriate- messing in kind way with therapy Neurological: Ox3   Extremities: R knee is 1-2+ edema less TTP as well; R shoulder some mild anterior swelling- looks better- is improving- but incision healing on R knee and R shoulder- R upper humerus mildly  TTP; 1+ LE edema B/L Skin: scalp with dry skin, denuded area where he's scratched. Slightly larger than dollar sized coin- Healing surgical wounds on right shoulder and knee c/d/I, sutures intact at R shoulder wounds. Looks good Sacral wound as below-- not reassessed   PRIOR EXAMS: Neuro: A&O x3, follows commands, normal speech and migraines, cranial nerves II to XII grossly intact  PRIOR EXAMS: Neuro:  Alert and oriented x 3. Normal insight and awareness. Intact Memory. Normal language and speech. Cranial nerve exam unremarkable. MMT: LUE 5/5/ RUE limited proximally due to pain but 5/5 distally. RLE 2/5 HF,KE pain, 4+ ankle. LLE 4/5 prox to 5/5 distal. Decreased LT in feet.   Musculoskeletal: right knee with swelling,  tender. Right shoulder  tender.     Assessment/Plan: 1. Functional deficits which require 3+ hours per day of interdisciplinary therapy in a comprehensive inpatient rehab setting. Physiatrist is providing close team supervision and 24 hour management of active medical problems listed below. Physiatrist and rehab team continue to assess barriers to discharge/monitor patient progress  toward functional and medical goals  Care Tool:  Bathing              Bathing assist Assist Level: Set up assist     Upper Body Dressing/Undressing Upper body dressing        Upper body assist Assist Level: Set up assist    Lower Body Dressing/Undressing Lower body dressing            Lower body assist Assist for lower body dressing: Moderate Assistance - Patient 50 - 74%     Toileting Toileting    Toileting assist Assist for toileting: 2 Helpers     Transfers Chair/bed transfer  Transfers assist     Chair/bed transfer assist level: Contact Guard/Touching assist     Locomotion Ambulation   Ambulation assist   Ambulation activity did not occur: Safety/medical concerns  Assist level: Minimal Assistance - Patient > 75% Assistive device: Walker-rolling Max  distance: 12   Walk 10 feet activity   Assist  Walk 10 feet activity did not occur: Safety/medical concerns  Assist level: Minimal Assistance - Patient > 75%     Walk 50 feet activity   Assist Walk 50 feet with 2 turns activity did not occur: Safety/medical concerns         Walk 150 feet activity   Assist Walk 150 feet activity did not occur: Safety/medical concerns         Walk 10 feet on uneven surface  activity   Assist Walk 10 feet on uneven surfaces activity did not occur: Safety/medical concerns         Wheelchair     Assist Is the patient using a wheelchair?: Yes Type of Wheelchair: Power    Wheelchair assist level: Independent Max wheelchair distance: 150'    Wheelchair 50 feet with 2 turns activity    Assist        Assist Level: Independent   Wheelchair 150 feet activity     Assist      Assist Level: Independent   Blood pressure 106/62, pulse 80, temperature 98.3 F (36.8 C), temperature source Oral, resp. rate 16, height 6' 2 (1.88 m), weight 101.1 kg, SpO2 98%.  Medical Problem List and Plan: 1. Functional deficits secondary to septic right knee and shoulder             -patient may shower if incisons are covered             -ELOS/Goals: 15-20 days, min assist goals with PT and OT, W/C level?  D/c date 2/12 D/w therapy- will need regular standard manual w/c at d/c- won't meet criteria for specialized w/c, esp power w/c.  D/w pharmacy and renal about lack of IV ABX last night- given another dose of Levaquin  per pharmacy.  Con't CIR PT and OT- to get HD today and receive all IV ABX in HD- d/w renal 2.  Antithrombotics:  -DVT/anticoagulation:  Pharmaceutical: Heparin  5kU q8h due to ESRD 1/28- advised pt to take his Heparin  since Plavix  doesn't cover DVT prophylaxis 2/3- pt had refused heparin  again yesterday during day-              -antiplatelet therapy: ASA/Plavix  daily  3. Pain Management: tylenol  prn, oxycodone   prn. Lidoderm  patches.  -pt will take oxycodone  prior to therapies based on his schedule. I won't schedule it  for now.   1/26- 1/27- Pain meds prn working for him- con't regimen  1/30- 2/2 pain controlled majority of time- con't regimen  2/4-2/6-  still intermittent pain in R upper humerus and R knee- but improving- swelling improved today 4. Mood/Behavior/Sleep:  LCSW to follow for evaluation and support.              -antipsychotic agents: N/A  -Melatonin PRN  5. Neuropsych/cognition: This patient is capable of making decisions on his own behalf.  6. Skin/Wound Care: surgical incisions CDI, healing  -air mattress for sacral wound, WOC. Santyl  to area of necrotic tissue  -have discussed importance of nutrition with pt  -medicated shampoo for scalp was helpful -03/07/24 pt inquiring about sutures R shoulder-- looks like surgery was 1/16, weekday team to touch base with ortho regarding timing of removal but likely this coming week.   1/26- can likely remove sutures 1/30  1/28- Pt on regular bed- didn't like air mattress  1/30 sutures out today-- ordered again 1/31, RN aware  7. Fluids/Electrolytes/Nutrition: Strict I/O. Renal diet w/1200 cc/FR. Continue vitamins/supplements. Continue Protonix  40mg  BID  -labs reviewed, stable  8.  Septic arthritis R-knee/R-shoulder: To continue Cefepime  and Vanc with HD thru 04/10/24             --shoulder to be aspirated in 8 weeks per Dr. [name]?  2/3- will get xray of R shoulder per pt request and upper humerus- due to increased pain   2/6- Just saw stable lucency related probably to infection 9.  Chronic hypotension: On midodrine  10mg  TID with prn doses. Stable  -1/25 BP stable overall continue current regimen and monitor  1/26- 1/27- BP slightly soft, but denies symptoms  2/6- Asymptomatic but BP stil on lower side since they are taking off more fluid in HD-   Vitals:   03/18/24 1430 03/18/24 1530 03/18/24 1600 03/18/24 1700  BP: 102/66 96/68 115/71  92/62   03/18/24 1706 03/18/24 1721 03/18/24 1809 03/18/24 2036  BP: 91/63 (!) 96/59 96/62 (!) 100/55   03/19/24 0551 03/19/24 1506 03/19/24 2043 03/20/24 0555  BP: 104/64 (!) 92/58 108/67 106/62     10. ESRD: HD MWF continue at the end of the day to help with tolerance of therapy. Continue Cinacalcet  and Sevelamer              --chronic hyponatremia managed with HD/FR  -have requested a piece of bacon or sausage on breakfast plate  -8/75/73 dialysis delayed to today (Sat) d/t census  -1/25 reviewed nephrology note, next HD tomorrow. 1/30- getting HD today per his schedule-- Dr. Melia tried to convince pt 1/31 to do extra dialysis d/t higher EDW but pt refused; appreciate nephro assistance  2/4- HD today- using 101 kg now to do HD  2/6- HD today- verified to do IV ABX while in HD since doesn't have IV access otherwise 11. T2DM: A1C- 5.1. Will continue to monitor BS ac/hs and if controlled decrease to twice a day  1/26- will reduce CBGs and SSI to BID-AC  1/30-2/3 look great- cont' regimen  CBG (last 3)  Recent Labs    03/19/24 1134 03/19/24 1632 03/20/24 0557  GLUCAP 86 96 79     12. Anemia of chronic disease: Jehovah's witness and refuses blood transfusion. Hgb ~8, continue Aranesp  and iron supplement and folic acid  -1/25 hemoglobin yesterday down to 6.6, he denies any bleeding.  Will check occult stool test.  Nephrology planning for high-dose EPO.  Nephrology ordered iron studies today and considering IV iron.  Patient declined blood transfusion, reports he understands risks.  1/26- labs pending- iron studies today as well  1/27- Given  Aranesp  and Iron in HD yesterday along with IV ABX- Hb was 6.3! Will recheck Thursday- but not daily, or will drain him more.  1/28- pt thought had labs in HD yesterday- did not, so did go over the Hb of 6.3 with him. On Aranesp  and PO/IV iron  1/29- Hb was 6.4 yesterday afternoon; 6.3 today! Went back and let pt know  1/30- Hgb 6.3, stable  2/2- Hb  up to 7.0- will let pt know  2/5- Hb up to 8.2 13. L suppurative Otitis media with TM perforation: Has completed antibiotic course.  --Follow up with [name] for evaluation. -Getting debrox for R ear cerumen impaction, DO NOT USE IN LEFT EAR  14. Cervical myelopathy: Gabapentin  300mg  nightly for neuropathic pain  15. PAD s/p R-SFA/popliteal lithotripsy/Stent: On ASA, Plavix  and Lipitor 10mg  daily    16. Constipation: continue Colace 100mg  BID, psyllium packet daily  -03/15/24 LBM yesterday, cont regimen  2/3- said going regularly, but computer doesn't show this- will d/w pt a little more?  2/4- Pt reports LBM yesterday, but chart shows 2/1- sounds like going with therapy.   2/6- LBM 2/4    I spent a total of  36  minutes on total care today- >50% coordination of care- due to  D/w pt about dietary preferences, his concerns about HD and IV ABX and what we've done to help with this- also d/w OT and nursing about overnight   LOS: 17 days A FACE TO FACE EVALUATION WAS PERFORMED  [name] 03/20/2024, 8:46 AM     "

## 2024-03-20 NOTE — Progress Notes (Signed)
 Patient ID: Carl Visconti Sr., male   DOB: 27-Sep-1960, 64 y.o.   MRN: [id number]  SW received call from pt son [name] who reported he is coming back on Tuesday with therapy, and inquires about if his father will be standing/walking. SW shared will ask therapy team. He recognizes that he will require a lot of care and prepared. SW shared can submit PCS referral, and explained process. SW emailed his son a list of PCS agencies to select and left copy in room as well.   1328-SW spoke with pt son to inform on progress he has made today, however, continuing to have a great deal of challenge with standing due to left quad/knee. He confirms received email for [facility] agencies.   [name], MSW, LCSW Office: [phone] Cell: [phone] Fax: [phone]

## 2024-03-20 NOTE — Progress Notes (Signed)
 Physical Therapy Session Note  Patient Details  Name: Carl Chain Sr. MRN: [id number] Date of Birth: 13-Feb-1960  Today's Date: 03/20/2024 PT Individual Time: 1000-1100 PT Individual Time Calculation (min): 60 min   Short Term Goals: Week 1:  PT Short Term Goal 1 (Week 1): pt will perform sit to stand with RW and max A PT Short Term Goal 1 - Progress (Week 1): Met PT Short Term Goal 2 (Week 1): pt will perform bed to chair transfer with LRAD and max A PT Short Term Goal 2 - Progress (Week 1): Met PT Short Term Goal 3 (Week 1): Pt will tolerate standing upright for ~ 2 min PT Short Term Goal 3 - Progress (Week 1): Met Week 2:  PT Short Term Goal 1 (Week 2): pt will initiate gait training PT Short Term Goal 2 (Week 2): Pt will perform STS from 24 surface with assist PT Short Term Goal 3 (Week 2): Pt will tolerate standing>5 min  Skilled Therapeutic Interventions/Progress Updates: Pt presented in PWC with nsg and lab present agreeable to therapy. Pt receives pain meds at start of session c/o 5/10 pain in knee. Discussed use of NMES during session to improve quad activation with pt agreeable. Pt stood with minA and was able to lower pants off hips with CGA. Pt returned to sitting and PTA doffed shoes and pants, threaded shorts and donned shoes total A for time management. Pt stood with minA and was able to pulls shorts over hips with CGA. Pt then navigated to day room mod I and completed stand step transfer to high/low mat. Worked on Sit to stand from 24in mat requiring modA and facilitation for anterior weight shifting. PTA donned hinged knee brace son dropped off earlier in am. Pt stood with same assist and worked on weight shifting and steps with LLE. Pt continues to require multimodal cues and blocking of R knee. PTA placed RLE in passive hamstring stretch while donning electrodes for NMES. Pt then stood with same assist and performed TKE with parameters as noted below. Pt able to tolerate  standing x 4 min with activity. After seated rest break pt stood again and worked on forward steps with emphasis on TKE in stance phase. Pt able to take x 2 steps during each 10 sec period. Pt was then able to progress to maintaining enough quad strength to take a step without buckling! Once completed pt navigated back to room at end of session. Pt left in PWC with call bell within reach and needs met.      Therapy Documentation Precautions:  Precautions Precautions: Fall Precaution/Restrictions Comments: watch HR, R knee buckles, spinal Restrictions Weight Bearing Restrictions Per Provider Order: No RUE Weight Bearing Per Provider Order: Weight bearing as tolerated RLE Weight Bearing Per Provider Order: Weight bearing as tolerated Other Position/Activity Restrictions: Per Dr. [name] on 1/19 pt can perform ROM and WB through RUE as tolerated General:      Therapy/Group: Individual Therapy  Kendrick Remigio 03/20/2024, 11:38 AM

## 2024-03-20 NOTE — Progress Notes (Signed)
 Occupational Therapy Session Note  Patient Details  Name: Carl Duce Sr. MRN: [id number] Date of Birth: 1961-01-26  Today's Date: 03/20/2024 OT Individual Time: 9145-8997 OT Individual Time Calculation (min): 68 min    Short Term Goals: Week 2:  OT Short Term Goal 1 (Week 2): Pt will complete toileting with Min A overall OT Short Term Goal 2 (Week 2): Pt will complete ADL LB dressing with CGA with AE as necessary OT Short Term Goal 3 (Week 2): Pt will complete ADL activity in standing for 2< min at CGA with LRAD  Skilled Therapeutic Interventions/Progress Updates:    Pt received sitting in his power w/c with no c/o pain, agreeable to OT session. Family education session completed with pt and his son [name]. Verbal education provided re fall risk reduction, energy conservation strategies, home carryover of transfer training, ADLs, and IADLs. Demonstration and hands on training completed for pt performance of UB/LB bathing and dressing at (S)- CGA level, toileting hygiene and transfers, and shower transfers. Provided education and demonstration on DME use recommendations at home, including TTB, BSC, and RW. Practiced a full tub shower transfer using a TTB. Provided demonstration and then pt's son was able to return demo and transferred pt into standing with appropriate assist and body mechanics. Provided education and cueing for improved body mechanics. He stood from power w/c with CGA using the Rw and was able to pivot to low TTB with CGA. Also Provided problem solving through pt's home accessibility. Discussed engagement in ADL/IADLs. Encouraged pt to not use lift chair unless actually needed to reduce atrophy of muscles. Pt returned to his room and was left sitting up in the power w/c with all needs met.   Therapy Documentation Precautions:  Precautions Precautions: Fall Precaution/Restrictions Comments: watch HR, R knee buckles, spinal Restrictions Weight Bearing Restrictions Per Provider  Order: No RUE Weight Bearing Per Provider Order: Weight bearing as tolerated RLE Weight Bearing Per Provider Order: Weight bearing as tolerated Other Position/Activity Restrictions: Per Dr. [name] on 1/19 pt can perform ROM and WB through RUE as tolerated  Therapy/Group: Individual Therapy  Nena VEAR Moats 03/20/2024, 10:30 AM

## 2024-03-20 NOTE — Progress Notes (Signed)
 "  [facility] Progress Note   Subjective:   Seen on HD. No complaints other than the room being cold. His BP dropped during tx and he felt a little lightheaded, but is feeling better now. Denies SOB/chest pain.   Objective Vitals:   03/20/24 0500 03/20/24 0555 03/20/24 1241 03/20/24 1249  BP:  106/62 98/64 102/66  Pulse:  80 86 84  Resp:  16 14 15   Temp:  98.3 F (36.8 C) 98.1 F (36.7 C)   TempSrc:  Oral    SpO2:  98% 100% 100%  Weight: 101.1 kg  103.3 kg   Height:       Physical Exam General: Lying in bed. NAD.  Heart: RRR, no murmur Lungs: CTAB, normal WOB Abdomen: non tender Extremities: RLE edema 1+ Dialysis Access: Rt AVF   Additional Objective Labs: Basic Metabolic Panel: Recent Labs  Lab 03/16/24 0413 03/19/24 0523 03/20/24 1300  NA 132* 134* 135  K 4.6 3.9 4.5  CL 90* 92* 92*  CO2 27 30 24   GLUCOSE 80 82 99  BUN 67* 30* 50*  CREATININE 7.62* 5.28* 6.94*  CALCIUM  8.8* 9.3 8.9  PHOS 3.5 3.0 3.9   Liver Function Tests: Recent Labs  Lab 03/16/24 0413 03/19/24 0523 03/20/24 1300  ALBUMIN  3.2* 3.6 3.6   CBC: Recent Labs  Lab 03/16/24 0413 03/19/24 0523 03/20/24 1300  WBC 7.4 7.1 9.4  NEUTROABS 4.2 3.2  --   HGB 7.0* 8.2* 7.7*  HCT 21.9* 26.2* 24.7*  MCV 89.8 91.9 92.9  PLT 354 348 357   Blood Culture    Component Value Date/Time   SDES TISSUE RIGHT SHOULDER 02/28/2024 1636   SPECREQUEST SWAB C INTRA ARTICULAR SUTURE 02/28/2024 1636   CULT  02/28/2024 1636    No growth aerobically or anaerobically. Performed at [facility], [address], [place], [name]    REPTSTATUS 03/04/2024 FINAL 02/28/2024 1636     CBG: Recent Labs  Lab 03/18/24 1827 03/19/24 0549 03/19/24 1134 03/19/24 1632 03/20/24 0557  GLUCAP 91 77 86 96 79   Medications:  ceFEPime  (MAXIPIME ) IV 2 g (03/16/24 1738)   vancomycin  Stopped (03/18/24 1749)    (feeding supplement) PROSource Plus  30 mL Oral BID BM   aspirin  EC  81 mg  Oral Daily   atorvastatin   10 mg Oral Daily   carbamide peroxide  10 drop Right EAR QPC supper   Chlorhexidine  Gluconate Cloth  6 each Topical Q0600   cinacalcet   120 mg Oral Q breakfast   clopidogrel   75 mg Oral Daily   collagenase    Topical Daily   docusate sodium   100 mg Oral BID   epoetin  alfa  20,000 Units Subcutaneous Q M,W,F-HD   ferrous sulfate   325 mg Oral Q breakfast   folic acid   1 mg Oral Daily   gabapentin   300 mg Oral QHS   heparin   5,000 Units Subcutaneous Q8H   hydrocerin   Topical BID   insulin  aspart  0-6 Units Subcutaneous BID AC   lidocaine   1 patch Transdermal Q24H   midodrine   10 mg Oral TID WC   multivitamin  1 tablet Oral QHS   pantoprazole   40 mg Oral BID AC   psyllium  1 packet Oral Daily   selenium  sulfide  1 Application Topical Daily   sevelamer  carbonate  1,600 mg Oral TID WC    Dialysis Orders MWF NW 4h  B400  87.3kg  2K bath  AVF  Heparin  3000 ESA: Mircera 50 q 2 wks (last 12/15) BMM: Calcitriol  2 mcg PO q HD, cinacalcet  120 mg daily   Assessment/Plan: Septic arthritis R knee and R shoulder: S/p R knee I&D/washout 1/9. S/p R shoulder I&D/washout 1/16. Cultures negative, but per ID will  continue Vancomycin  IV 1g and Cefepime  IV 2g with HD  until 04/10/24. 2. Otitis media w/ TM perforation: s/p abx, ENT has seen 3. ESRD: On HD MWF. For HD today. Tolerated 3.5L UF Wed. On HD now, UFG 3L. BP dropped so UF paused for now and given 2 100 mL NS bolus. Unsure about volume status, way up from EDW and with RLE edema but recently had surgery on this leg so could be inflammation vs fluid. Ordered xray for tomorrow AM to further assess.  4. Chronic hypotension/volume: On midodrine  for BP support, UF as tolerated. Cannot use albumin  due to refusal of blood products which limits BP support options. Cont to limit fluid intake.  5. Anemia of ESRD: Hgb improved to 8.2. Declines albumin  and blood products as he is a Jehovah Witness. Cont high dose EPO 20,000u TID with  HD. 6. Secondary HPTH: VDRA on hold as CorrCa was slightly elevated, normal now, can continue to hold.  Sevelamer  dec to 2 tabs TIDM 1/29, phos is now 3. Can dec to one tab if phos continues to decline. Continue sensipar .  7. Nutrition: Cont renal diet and prot supp.    Signe Sick, PA-C 03/20/2024, 2:42 PM  [name]    "

## 2024-03-20 NOTE — Progress Notes (Signed)
 Pharmacy Antibiotic Note  Carl Meiner Sr. is a 64 y.o. male admitted on 03/03/2024 with right septic knee s/p washout 1/9. Pharmacy consulted for vancomycin  dosing. Patient is ESRD on HD MWF.  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 An informational order will be placed for discharge.   Plan: Cefepime  2 gm with HD MWF Continue Vancomycin  1000 mg every MWF with HD  Monitor dialysis sessions/tolerance ID planning 6 weeks of therapy (Last doses 04/10/24)   Height: 6' 2 (188 cm) Weight: 101.1 kg (222 lb 14.2 oz) IBW/kg (Calculated) : 82.2  Temp (24hrs), Avg:98.1 F (36.7 C), Min:97.7 F (36.5 C), Max:98.3 F (36.8 C)  Recent Labs  Lab 03/13/24 1430 03/16/24 0413 03/19/24 0523 03/20/24 1010  WBC 9.3 7.4 7.1  --   CREATININE 6.69* 7.62* 5.28*  --   VANCORANDOM  --   --   --  26    Estimated Creatinine Clearance: 17.7 mL/min (A) (by C-G formula based on SCr of 5.28 mg/dL (H)).    Thank you for allowing pharmacy to be a part of this patients care.   Donny Alert, PharmD, [facility] Clinical Pharmacist Please see AMION for all Pharmacists' Contact Phone Numbers 03/20/2024, 12:35 PM

## 2024-04-14 ENCOUNTER — Ambulatory Visit: Payer: Self-pay | Admitting: Infectious Disease

## 2024-07-29 ENCOUNTER — Ambulatory Visit (HOSPITAL_COMMUNITY)

## 2024-07-29 ENCOUNTER — Ambulatory Visit
